# Patient Record
Sex: Male | Born: 1945 | Race: Black or African American | Hispanic: No | Marital: Single | Smoking: Never smoker
Health system: Southern US, Community
[De-identification: ages and names within clinical notes are randomized; demographics above are authoritative.]

## PROBLEM LIST (undated history)

## (undated) DIAGNOSIS — N400 Enlarged prostate without lower urinary tract symptoms: Secondary | ICD-10-CM

## (undated) DIAGNOSIS — M6281 Muscle weakness (generalized): Secondary | ICD-10-CM

## (undated) DIAGNOSIS — E039 Hypothyroidism, unspecified: Secondary | ICD-10-CM

## (undated) DIAGNOSIS — E875 Hyperkalemia: Secondary | ICD-10-CM

## (undated) DIAGNOSIS — A419 Sepsis, unspecified organism: Secondary | ICD-10-CM

## (undated) DIAGNOSIS — F79 Unspecified intellectual disabilities: Secondary | ICD-10-CM

## (undated) DIAGNOSIS — I10 Essential (primary) hypertension: Secondary | ICD-10-CM

## (undated) DIAGNOSIS — N12 Tubulo-interstitial nephritis, not specified as acute or chronic: Secondary | ICD-10-CM

## (undated) DIAGNOSIS — E870 Hyperosmolality and hypernatremia: Secondary | ICD-10-CM

## (undated) DIAGNOSIS — K219 Gastro-esophageal reflux disease without esophagitis: Secondary | ICD-10-CM

## (undated) DIAGNOSIS — N1330 Unspecified hydronephrosis: Secondary | ICD-10-CM

## (undated) DIAGNOSIS — N289 Disorder of kidney and ureter, unspecified: Secondary | ICD-10-CM

## (undated) DIAGNOSIS — R479 Unspecified speech disturbances: Secondary | ICD-10-CM

## (undated) DIAGNOSIS — N179 Acute kidney failure, unspecified: Secondary | ICD-10-CM

## (undated) DIAGNOSIS — N151 Renal and perinephric abscess: Secondary | ICD-10-CM

## (undated) DIAGNOSIS — E785 Hyperlipidemia, unspecified: Secondary | ICD-10-CM

## (undated) DIAGNOSIS — F17200 Nicotine dependence, unspecified, uncomplicated: Secondary | ICD-10-CM

## (undated) DIAGNOSIS — N19 Unspecified kidney failure: Secondary | ICD-10-CM

## (undated) DIAGNOSIS — R339 Retention of urine, unspecified: Secondary | ICD-10-CM

## (undated) DIAGNOSIS — Z992 Dependence on renal dialysis: Secondary | ICD-10-CM

## (undated) DIAGNOSIS — F419 Anxiety disorder, unspecified: Secondary | ICD-10-CM

## (undated) DIAGNOSIS — R609 Edema, unspecified: Secondary | ICD-10-CM

## (undated) DIAGNOSIS — D649 Anemia, unspecified: Secondary | ICD-10-CM

## (undated) DIAGNOSIS — I82409 Acute embolism and thrombosis of unspecified deep veins of unspecified lower extremity: Secondary | ICD-10-CM

## (undated) DIAGNOSIS — N139 Obstructive and reflux uropathy, unspecified: Secondary | ICD-10-CM

## (undated) DIAGNOSIS — E46 Unspecified protein-calorie malnutrition: Secondary | ICD-10-CM

## (undated) DIAGNOSIS — R93429 Abnormal radiologic findings on diagnostic imaging of unspecified kidney: Secondary | ICD-10-CM

## (undated) DIAGNOSIS — N390 Urinary tract infection, site not specified: Secondary | ICD-10-CM

## (undated) DIAGNOSIS — N189 Chronic kidney disease, unspecified: Secondary | ICD-10-CM

## (undated) DIAGNOSIS — T827XXA Infection and inflammatory reaction due to other cardiac and vascular devices, implants and grafts, initial encounter: Secondary | ICD-10-CM

## (undated) DIAGNOSIS — N3289 Other specified disorders of bladder: Secondary | ICD-10-CM

## (undated) DIAGNOSIS — Z789 Other specified health status: Secondary | ICD-10-CM

## (undated) DIAGNOSIS — R195 Other fecal abnormalities: Secondary | ICD-10-CM

## (undated) DIAGNOSIS — IMO0001 Reserved for inherently not codable concepts without codable children: Secondary | ICD-10-CM

## (undated) SURGERY — CYSTOSCOPY, WITH RETROGRADE PYELOGRAM
Anesthesia: Choice | Laterality: Bilateral

---
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
## 2001-03-04 ENCOUNTER — Inpatient Hospital Stay (HOSPITAL_COMMUNITY): Admission: AD | Admit: 2001-03-04 | Discharge: 2001-03-09 | Payer: Self-pay | Admitting: Internal Medicine

## 2002-04-28 ENCOUNTER — Inpatient Hospital Stay (HOSPITAL_COMMUNITY): Admission: AD | Admit: 2002-04-28 | Discharge: 2002-05-06 | Payer: Self-pay | Admitting: General Surgery

## 2002-05-07 ENCOUNTER — Encounter (HOSPITAL_COMMUNITY): Admission: RE | Admit: 2002-05-07 | Discharge: 2002-06-06 | Payer: Self-pay | Admitting: Family Medicine

## 2003-10-07 ENCOUNTER — Emergency Department (HOSPITAL_COMMUNITY): Admission: EM | Admit: 2003-10-07 | Discharge: 2003-10-07 | Payer: Self-pay | Admitting: Emergency Medicine

## 2005-02-27 ENCOUNTER — Inpatient Hospital Stay (HOSPITAL_COMMUNITY): Admission: AD | Admit: 2005-02-27 | Discharge: 2005-03-04 | Payer: Self-pay | Admitting: Family Medicine

## 2006-01-25 ENCOUNTER — Emergency Department (HOSPITAL_COMMUNITY): Admission: EM | Admit: 2006-01-25 | Discharge: 2006-01-25 | Payer: Self-pay | Admitting: Emergency Medicine

## 2006-07-04 ENCOUNTER — Emergency Department (HOSPITAL_COMMUNITY): Admission: EM | Admit: 2006-07-04 | Discharge: 2006-07-04 | Payer: Self-pay | Admitting: Emergency Medicine

## 2007-01-19 ENCOUNTER — Emergency Department (HOSPITAL_COMMUNITY): Admission: EM | Admit: 2007-01-19 | Discharge: 2007-01-19 | Payer: Self-pay | Admitting: Emergency Medicine

## 2008-02-08 ENCOUNTER — Emergency Department (HOSPITAL_COMMUNITY): Admission: EM | Admit: 2008-02-08 | Discharge: 2008-02-08 | Payer: Self-pay | Admitting: Emergency Medicine

## 2008-02-10 ENCOUNTER — Ambulatory Visit: Payer: Self-pay | Admitting: Orthopedic Surgery

## 2008-02-10 DIAGNOSIS — S82409A Unspecified fracture of shaft of unspecified fibula, initial encounter for closed fracture: Secondary | ICD-10-CM | POA: Insufficient documentation

## 2008-02-15 ENCOUNTER — Ambulatory Visit: Payer: Self-pay | Admitting: Orthopedic Surgery

## 2008-02-17 ENCOUNTER — Ambulatory Visit: Payer: Self-pay | Admitting: Orthopedic Surgery

## 2008-02-17 ENCOUNTER — Telehealth: Payer: Self-pay | Admitting: Orthopedic Surgery

## 2008-02-21 ENCOUNTER — Encounter: Payer: Self-pay | Admitting: Orthopedic Surgery

## 2008-03-06 ENCOUNTER — Encounter: Payer: Self-pay | Admitting: Orthopedic Surgery

## 2008-03-21 ENCOUNTER — Encounter: Payer: Self-pay | Admitting: Orthopedic Surgery

## 2010-11-26 NOTE — Assessment & Plan Note (Signed)
Summary: [facility] F/UP/FX LT ANKLE/XR [facility] 02/08/08/SELFPAY/CAF   Vital Signs:  Patient Profile:   65 Years Old Male Pulse rate:   68 / minute Resp:     18 per minute  Vitals Entered By: Arther Abbott MD (February 10, 2008 3:17 PM)                 Chief Complaint:  left ankle fx.  History of Present Illness: I saw Zachary [name] in the office today for an initial visit.  He is a 65 years old man with the complaint of:  Left ankle fracture, xrays taken at [facility] on 02-08-08.  Injury happened around the end of March, he fell in his house. Today has splint on and is weightbearing with crutches.  He has some soreness not real bad pain.      Current Allergies: No known allergies   Past Medical History:    diabetes  Past Surgical History:    none   Social History:    Patient is single.     unemployed   Risk Factors:  Tobacco use:  never Caffeine use:  0 drinks per day Alcohol use:  no   Review of Systems  General      Denies weight loss, weight gain, fever, chills, and fatigue.  Cardiac      Denies chest pain, angina, heart attack, heart failure, poor circulation, blood clots, and phlebitis.  Resp      Denies short of breath, difficulty breathing, COPD, cough, and pneumonia.  GI      Denies nausea, vomiting, diarrhea, constipation, difficulty swallowing, ulcers, GERD, and reflux.  GU      Denies kidney failure, kidney transplant, kidney stones, burning, poor stream, testicular cancer, blood in urine, and .  Neuro      Denies headache, dizziness, migraines, numbness, weakness, tremor, and unsteady walking.  MS      Denies joint pain, rheumatoid arthritis, joint swelling, gout, bone cancer, osteoporosis, and .  Endo      Complains of diabetes.      Denies thyroid disease and goiter.  Psych      Denies depression, mood swings, anxiety, panic attack, bipolar, and schizophrenia.  Derm      Denies eczema, cancer, and itching.  EENT      Denies poor  vision, cataracts, glaucoma, poor hearing, vertigo, ears ringing, sinusitis, hoarseness, toothaches, and bleeding gums.  Immunology      Denies seasonal allergies, sinus problems, and allergic to bee stings.  Lymphatic      Denies lymph node cancer and lymph edema.     Impression & Recommendations:  Problem # 1:  FX CLOSED FIBULA NOS (ICD-823.81) Assessment: New the x-rays are from any pain hospital. They show a nondisplaced fibular fracture. They are dated April 14. Orders: New Patient Level III [id number]) Lat. Malleolus Fx [id number])  a short-leg cast was applied in neutral position.   Medications Added to Medication List This Visit: 1)  Glucophage 500 Mg Tabs (Metformin hcl) 2)  Vicodin 5-500 Mg Tabs (Hydrocodone-acetaminophen) .Marland Kitchen.. 1 q 4 prn   Patient Instructions: 1)  Please schedule a follow-up appointment in 5 weeks. 2)  xrays OOP 3)  WB in cast shoe     Prescriptions: VICODIN 5-500 MG  TABS (HYDROCODONE-ACETAMINOPHEN) 1 q 4 prn  #60 x 0   Entered by:   Peter Minium   Authorized by:   Arther Abbott MD   Signed by:   Carlena Bjornstad  [place] on 02/10/2008   Method used:   Print then Mail to Patient   RxID:   [id number]  ]

## 2010-11-26 NOTE — Progress Notes (Signed)
Summary: cast too tight  Phone Note Other Incoming   Summary of Call: Demaury Barahona' ([birth year]) sister, Judson Roch, called to say that Alija said his cast had gotted too tight since last night.  Was changed 02/15/08.  His # is [phone]. Initial call taken by: Ruffin Pyo,  February 17, 2008 11:50 AM  Follow-up for Phone Call        come in this pm  Follow-up by: Arther Abbott MD,  February 17, 2008 12:39 PM  Additional Follow-up for Phone Call Additional follow up Details #1::        patient advised to come in this afternoon Additional Follow-up by: Ruffin Pyo,  February 17, 2008 12:46 PM

## 2010-11-26 NOTE — Letter (Signed)
Summary: *Orthopedic No Show Letter  [facility]  [address]. [place] [address]  [place], [place] [address]   Phone: [phone]  Fax: [phone]     03/21/2008    [name] [address] [place], [place]  [address]    Dear [name],   [name] records indicate that you missed your scheduled appointment with [name] on Thursday, Mar 16, 2008.  Please contact this office to reschedule your appointment as soon as possible.  It is important that you keep your scheduled appointments with your physician, so we can provide you the best care possible.  We have enclosed an appointment card for your convenience.      Sincerely,   [name], MD [facility] Phone [phone]

## 2010-11-26 NOTE — Assessment & Plan Note (Signed)
Summary: CAST CHANGE/DOI 02/08/08/SELF PAY/CAF    History of Present Illness: I saw Zachary [name] in the office today for a followup visit.  He is a 65 years old man with the complaint of:  cast tight  He has a fibular fracture left leg nondisplaced cast placed approximately 4 days ago.    Current Allergies: No known allergies         Impression & Recommendations:  Problem # 1:  FX CLOSED FIBULA NOS (ICD-823.81)  Orders: Post-Op Check [id number]) Short Leg Cast [id number])    Patient Instructions: 1)  keep appt    ]

## 2010-11-26 NOTE — Assessment & Plan Note (Signed)
Summary: CAST CHANGE/BSF    History of Present Illness: I saw Zachary [name] in the office today for a followup visit.  He is a 65 years old man with the complaint of:  Cast too tight.  DOI end of march.    Current Allergies: No known allergies       Physical Exam  Extremities:     no change in foot and ankle    Impression & Recommendations:  Problem # 1:  FX CLOSED FIBULA NOS (ICD-823.81) Assessment: Unchanged AIR CAST  Orders: Post-Op Check [id number])    Patient Instructions: 1)  Keep appointment 2)  Keep air cast on except for bathing.    ]

## 2010-12-01 ENCOUNTER — Emergency Department (HOSPITAL_COMMUNITY)
Admission: EM | Admit: 2010-12-01 | Discharge: 2010-12-01 | Disposition: A | Discharge status: Home / Self Care | Payer: Self-pay | Attending: Emergency Medicine | Admitting: Emergency Medicine

## 2010-12-01 DIAGNOSIS — N419 Inflammatory disease of prostate, unspecified: Secondary | ICD-10-CM | POA: Insufficient documentation

## 2010-12-01 DIAGNOSIS — R358 Other polyuria: Secondary | ICD-10-CM | POA: Insufficient documentation

## 2010-12-01 DIAGNOSIS — R631 Polydipsia: Secondary | ICD-10-CM | POA: Insufficient documentation

## 2010-12-01 DIAGNOSIS — R3589 Other polyuria: Secondary | ICD-10-CM | POA: Insufficient documentation

## 2010-12-01 DIAGNOSIS — I10 Essential (primary) hypertension: Secondary | ICD-10-CM | POA: Insufficient documentation

## 2010-12-01 DIAGNOSIS — R350 Frequency of micturition: Secondary | ICD-10-CM | POA: Insufficient documentation

## 2010-12-01 DIAGNOSIS — E119 Type 2 diabetes mellitus without complications: Secondary | ICD-10-CM | POA: Insufficient documentation

## 2010-12-01 DIAGNOSIS — Z794 Long term (current) use of insulin: Secondary | ICD-10-CM | POA: Insufficient documentation

## 2010-12-01 LAB — URINE MICROSCOPIC-ADD ON

## 2010-12-01 LAB — CBC
HCT: 34.4 % — ABNORMAL LOW (ref 39.0–52.0)
Hemoglobin: 12.4 g/dL — ABNORMAL LOW (ref 13.0–17.0)
MCH: 33.4 pg (ref 26.0–34.0)
MCHC: 36 g/dL (ref 30.0–36.0)
MCV: 92.7 fL (ref 78.0–100.0)
Platelets: 185 10*3/uL (ref 150–400)
RBC: 3.71 MIL/uL — ABNORMAL LOW (ref 4.22–5.81)
RDW: 12.8 % (ref 11.5–15.5)
WBC: 5.2 10*3/uL (ref 4.0–10.5)

## 2010-12-01 LAB — URINALYSIS, ROUTINE W REFLEX MICROSCOPIC
Bilirubin Urine: NEGATIVE
Ketones, ur: NEGATIVE mg/dL
Nitrite: NEGATIVE
Protein, ur: NEGATIVE mg/dL
Specific Gravity, Urine: 1.01 (ref 1.005–1.030)
Urine Glucose, Fasting: >1000 mg/dL — AB
Urobilinogen, UA: 0.2 mg/dL (ref 0.0–1.0)
pH: 5 (ref 5.0–8.0)

## 2010-12-01 LAB — GLUCOSE, CAPILLARY
Glucose-Capillary: 387 mg/dL — ABNORMAL HIGH (ref 70–99)
Glucose-Capillary: 131 mg/dL — ABNORMAL HIGH (ref 70–99)

## 2010-12-01 LAB — DIFFERENTIAL
Basophils Absolute: 0 10*3/uL (ref 0.0–0.1)
Basophils Relative: 1 % (ref 0–1)
Eosinophils Absolute: 0.3 10*3/uL (ref 0.0–0.7)
Eosinophils Relative: 5 % (ref 0–5)
Lymphocytes Relative: 38 % (ref 12–46)
Lymphs Abs: 2 10*3/uL (ref 0.7–4.0)
Monocytes Absolute: 0.6 10*3/uL (ref 0.1–1.0)
Monocytes Relative: 12 % (ref 3–12)
Neutro Abs: 2.3 10*3/uL (ref 1.7–7.7)
Neutrophils Relative %: 45 % (ref 43–77)

## 2010-12-01 LAB — BASIC METABOLIC PANEL
BUN: 9 mg/dL (ref 6–23)
CO2: 23 mEq/L (ref 19–32)
Calcium: 8.8 mg/dL (ref 8.4–10.5)
Chloride: 103 mEq/L (ref 96–112)
Creatinine, Ser: 0.99 mg/dL (ref 0.4–1.5)
GFR calc Af Amer: >60 mL/min (ref >60)
GFR calc non Af Amer: >60 mL/min (ref >60)
Glucose, Bld: 423 mg/dL — ABNORMAL HIGH (ref 70–99)
Potassium: 3.7 mEq/L (ref 3.5–5.1)
Sodium: 136 mEq/L (ref 135–145)

## 2010-12-02 LAB — URINE CULTURE
Colony Count: 50000
Culture  Setup Time: 201202052018

## 2011-03-14 NOTE — Discharge Summary (Signed)
NAMESKILAR, LEFRANCOIS NO.:  [id number]   MEDICAL RECORD NO.:  [id number]          PATIENT TYPE:  INP   LOCATION:  A320                          FACILITY:  [facility]   PHYSICIAN:  Barrie Folk. Hill, MD     DATE OF BIRTH:  Mar 18, 1946   DATE OF ADMISSION:  02/27/2005  DATE OF DISCHARGE:  05/09/2006LH                                 DISCHARGE SUMMARY   HISTORY OF PRESENT ILLNESS:  The patient was a 65 year old singled, employed  black male from [place], [place]:  Increased thirst and increased frequency of urination.  Duration of symptoms was two weeks.  The patient was also complaining of dry  mouth and general malaise.  He had lost approximately 12 pounds since Mar 15, 2004.   PAST MEDICAL HISTORY:  1.  Insulin-dependent diabetes mellitus.  [name] last office visit      was Mar 15, 2004, for current visit.  2.  Hospitalization for pneumonia by Dr. Moshe Cipro.  3.  Nonketotic hyperosmolar hyperglycemia in March 2001.  4.  Uncontrolled diabetes mellitus in May 2002.  5.  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   ALLERGIES:  No known drug allergies.   HABITS:  Negative for tobacco, ethanol, or street drugs.   FAMILY HISTORY:  Mother deceased, cause unknown.  Father deceased, cause  unknown.  One brother deceased secondary to complications of renal failure.  One sister deceased, cause unknown.  Two sisters living.  One with a history  of diabetes mellitus, and one in her 85s health unknown.  Three brothers  living.  One with a history of diabetes mellitus and two with a history of  hypertension.  Two daughters, currently adolescent, living, good health.   HOSPITAL COURSE:  #1 -  DIABETIC KETOACIDOSIS:  GENERAL:  The patient  appeared to be a middle-aged medium height, middle framed, adult black male  in no current respiratory distress.  VITAL SIGNS:  Temperature 98.7, pulse  106, respiratory rate 20,  blood pressure 132/82.  LUNGS:  Clear.  HEART:  S1  and S2 without murmurs, rubs, or gallops.  Rhythm was regular and rate was  within normal limits.  ABDOMEN:  Slightly obese with hypoactive bowel  sounds.  Abdomen was soft and nontender in all four quadrants.  No palpable  mass or organomegaly.  EXTREMITIES:  No ulcers, redness, discoloration, or  edema.  Feet demonstrated palpable dorsalis pedis bilaterally.  NEUROLOGIC:  The patient was neurologically intact.  Significant labs on admission were  as follows:  White count 4.5, hemoglobin 13.7, hematocrit 38.8, platelets  189,000.  Sodium 130, potassium 3.6, chloride 95, CO2 of 26, glucose 289,  BUN 12, creatinine 1, calcium 8.6.  Total protein 7.4, albumin 3.3, AST 24,  ALT 30, ALP 94, total bilirubin 1.3.  Serum ketones large.  Hemoglobin A1C  of 11.5.  Urinalysis (specific gravity 1.02, pH of 5, glucose of 500 mg/dL,  trace of hemoglobin, negative for bilirubin, 40 mg/dL of ketones, nitrite  negative,  leukocyte esterase negative).  The patient was treated with IV  fluids using normal saline, Accu-Chek a.c. and bedtime, dietitian consult,  Metaglip 5/500 p.o. daily initially, Lantus insulin subcutaneously every  2200, and diet carbohydrate modified, 4 g sodium, low cholesterol, 2200  calories.  [name] hospitalization was uneventful.  Dietitian did see  the patient during this hospitalization and recommended carbohydrate  modified diet.  The patient received diabetic teaching pertaining to the use  of Lantus insulin pen during this hospitalization.  Fasting blood sugar on  the morning of discharge was 151.  The patient had no complaint of stomach  pain, nausea, vomiting, or diaphoresis.  He was alert and oriented to  person, place, and time.  Skin was warm and dry.  Abdominal examination was  benign.  The patient was discharged to his home where he lives with his  sister.   #2 -  ELECTROLYTE IMBALANCE:  Serum sodium on admission  was 130, with a  potassium of 3.6, chloride of 95, CO2 of 26.  The patient had repeat  electrolytes on Feb 28, 2005, that revealed the following:  Sodium of 134,  potassium of 3.8, chloride of 102, CO2 of 28.  He was treated with IV normal  saline on admission.   #3 -  MILD PROSTATIC ENLARGEMENT:  Rectal examination demonstrated no  external lesions.  Prostate was mildly enlarged and smooth.  Stool was  slightly positive.  The patient will be scheduled for an outpatient  colonoscopy.  CEA level was 2.2.  PSA was 1.99 ng/mL (normal 0.1 to 4).  Free TSA was 0.3 ng/mL.   #4 -  DIABETIC PERIPHERAL NEUROPATHY:  The patient complained of numbness of  hands and feet at times.  He was started on Neurontin 300 mg p.o. every day.  The patient is not complaining of this type of discomfort at time of  discharge.   LABORATORY DATA:  Fasting lipid profile demonstrated the following on Feb 28, 2005, at 0605:  Total cholesterol 143 mg/dL, triglycerides 126 mg/dL, HDL 24  mg/dL, LDL of 94 mg/dL.   DISCHARGE INSTRUCTIONS:  1.  Diet:  Carbohydrate modified, low salt.  2.  Activity:  No restrictions.   DISCHARGE MEDICATIONS:  1.  Glyburide/Metformin 5/500 one tablet b.i.d. with meals (breakfast and      supper).  2.  Lantus insulin 28 units subcutaneously at 10 p.m. every day.  3.  Gabapentin 300 mg one tab daily.  4.  Aspirin 81 mg p.o. daily.  5.  Accu-Chek at 7 a.m. and 10 p.m. every day.   FOLLOWUP:  Follow up at office on Mar 11, 2005.   FINAL PRIMARY DIAGNOSIS:  Diabetic ketoacidosis.   SECONDARY DIAGNOSES:  1.  Electrolyte imbalance.  2.  Mildly enlarged prostate.  3.  Diabetic peripheral neuropathy.      GKH/MEDQ  D:  03/04/2005  T:  03/04/2005  Job:  [id number]

## 2011-03-14 NOTE — H&P (Signed)
Zachary [name], Zachary [name] NO.:  [id number]   MEDICAL RECORD NO.:  [id number]          PATIENT TYPE:  INP   LOCATION:  A320                          FACILITY:  [facility]   PHYSICIAN:  Barrie Folk. Berdine Addison, MD     DATE OF BIRTH:  1946-09-13   DATE OF ADMISSION:  02/27/2005  DATE OF DISCHARGE:  LH                                HISTORY & PHYSICAL   IDENTIFYING DATA:  The patient is a 65 year old single, employed black male  from [place], [place].   CHIEF COMPLAINT:  Increased thirst and increased frequency of urination.   HISTORY OF PRESENT ILLNESS:  Duration of symptoms is two weeks.  Patient  also complaining of a dry mouth and general malaise.  Patient has  experienced some mild weight loss of 12 more pounds since Mar 15, 2004.  He  denies nausea, vomiting, syncope, leg ulcers, stomach pain, and blurred  vision.   PAST MEDICAL HISTORY:  Positive for insulin-dependent diabetes mellitus.   Medical history is negative for hypertension, diabetes, tuberculosis,  cancer, sickle cell, asthma, and seizure disorder.   PRESCRIBED MEDICATIONS ON ADMISSION:  1.  Humalog 75/25 5 units subcu every morning at breakfast.  2.  Metaglip 5/500 p.o. every day with breakfast.   [name] last office visit was Mar 15, 2004 before the visit today.   The patient is not allergic to any known medication.   HABITS:  Negative for tobacco, ethanol, or street drugs.   PAST MEDICAL HISTORY:  Positive for hospitalization for pneumonia, Dr.  Moshe Cipro.  Hospitalization for nonketotic hyperosmolar hyperglycemia in  March, 2001.  Hospitalization for uncontrolled type 2 diabetes mellitus in  May, 2002.  Hospitalization for severe felon of right index finger with  secondary tenosynovitis in July, 2003.   REVIEW OF SYSTEMS:  Negative for chronic headache, epistaxis, bleeding gums,  hemoptysis, chronic cough, wheezing, shortness of breath, hematemesis, gross  hematuria, dysuria, melena,  diarrhea, constipation, edema of the leg, night  sweats, dysphagia, etc.   FAMILY HISTORY:  Mother deceased, cause unknown.  Father deceased, cause  unknown.  One brother deceased, secondary to complication of renal failure.  One sister deceased, cause unknown.  Two sisters living, one in her 63s with  a history of diabetes mellitus and one in her 57s, health unknown.  Three  brothers, living.  One with a history of diabetes mellitus.  Two with a  history of hypertension.  The patient has two children (daughters),  adolescents.   Social history revealed low education level and mild mental retardation.   PHYSICAL EXAMINATION:  VITAL SIGNS:  Temperature 98.7, pulse 106,  respirations 20, blood pressure 132/82.  GENERAL APPEARANCE:  A middle-aged, medium height, medium frame, alert black  male in no apparent respiratory distress.  HEENT:  Head normocephalic.  Ears:  Normal auricles.  External canal patent.  Tympanic membranes are pearly gray.  Eyes:  Lids negative for ptosis.  Sclerae are white.  Pupils are equal, round and reactive to light.  Extraocular movements are intact.  No negative discharge.  Mouth:  Dentition  fair.  Positive for missing teeth.  No bleeding gums.  No oral lesions.  Posterior pharynx benign.  NECK:  Negative for lymphadenopathy or thyromegaly.  HEART:  Audible S1 and S2.  No murmur, rub or gallop.  Regular rhythm and  rate within normal limits.  ABDOMEN:  Slightly obese.  Hypoactive bowel sounds.  Soft, nontender in all  four quadrants.  No palpable mass.  No organomegaly.  GENITALIA:  Penis uncircumcised.  No penile lesion or discharge.  Scrotum,  pelvic, testicles without nodules or tenderness.  RECTAL:  No external lesions.  Prostate mildly enlarged and smooth.  Stool  guaiac positive.  EXTREMITIES:  Knees:  Positive with crepitus bilaterally.  No joint  swelling.  No joint redness.  No joint warmth.  No atypia edema.  Feet:  Palpable dorsalis pedis  bilaterally.  NEUROLOGIC:  Alert and oriented to person, place, and time.  Cranial nerves  II-XII appeared intact.   LABS:  White count 4.5, hemoglobin 13.7, hematocrit 38.8, platelets 189,000.  Sodium 130, potassium 3.6, chloride 95, CO2 26, glucose 289, BUN 12,  creatinine 1.  Total bilirubin 1.3.  Alkaline phosphatase 94.  SGOT 24, SGPT  30, total protein 7.4, albumin 3.3, calcium 8.6.  Urinalysis:  Specific  gravity 1.020, pH 5, urine glucose 500 mg/dl, bilirubin negative, ketones 40  mg/dl, trace blood, trace protein, nitrate negative, leukocyte negative.   PLAN:  IV fluids.  Accu-Chek q.a.c. and at bedtime.  Dietician consult.  Diabetic teaching.  Metaglip 5/500 p.o. daily with breakfast.  Lantus  insulin 20 units subcu at 2200.  CEA level.  EKG.   DIET:  Carbohydrate-modified, 4 gm sodium, low cholesterol.   Fasting lipid profile, hemoglobin A1C, EKG, diagnostic colonoscopy if  possible because of guaiac positive stool.   FINAL PRIMARY DIAGNOSES:  1.  Uncontrolled diabetes mellitus.  2.  Hypertension.  3.  Enlarged prostate.      GKH/MEDQ  D:  02/27/2005  T:  02/27/2005  Job:  [id number]

## 2011-03-14 NOTE — H&P (Signed)
[facility]  Patient:    Zachary [name], Zachary [name] Visit Number: [id number] MRN: [id number]          Service Type: MED Location: 3A A330 01 Attending Physician:  Delorise Jackson Dictated by:   Irving Shows, M.D. Admit Date:  04/28/2002                           History and Physical  HISTORY OF PRESENT ILLNESS:  A 65 year old male with history of diabetes mellitus, hyperlipidemia.  The patient states that he has had a sore finger for at least three weeks and possibly four weeks.  He has not received any therapy.  He has a major felon with extension of longus tendon with masses, swelling, and noticeable dissection of subcutaneous purulence along the entire finger from the tip to the metatarsophalangeal joint.  This involves the index finger of the left hand.  There is no extension to the other fingers, to the palmar, or dorsal aspect of his hand.  He cannot bend the index finger at all.  Digital block was done and drainage of a large volume of pus was carried out in the office.  It appeared that most of the muscular and tendinous aspect of the finger was involved.  The patient was admitted for severe tenosynovitis.  PAST MEDICAL HISTORY:  He has diabetes and hyperlipidemia as noted above.  MEDICATIONS: 1. Tri-Chlor 160 mg q.d. 2. Glucophage XR 500 mg q.d. 3. Actos 30 mg q.d.  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  He does not drink, smoke, or use drugs.  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  PHYSICAL EXAMINATION:  VITAL SIGNS:  Blood pressure 153/98, pulse 90, respirations 18.  Weight 183 pounds.  Height 5 foot 11 inches.  HEENT:  Unremarkable.  There is no thrush.  NECK:  Supple without JVD or bruit.  CHEST:  Clear to auscultation.  HEART:  Regular rate and rhythm without murmur, gallop, or rub.  ABDOMEN:  Soft and nontender.  No masses.  Normal active bowel  sounds.  EXTREMITIES:  Unremarkable except for the extensor felon with tenosynovitis of the right index finger as described above.  There is no lymphangitis or axillary adenopathy.  NEUROLOGICAL:  No focal motor sensory or other cerebellar deficit.  IMPRESSION: 1. Extensor felon with secondary tenosynovitis right index finger. 2. Uncontrolled diabetes mellitus. 3. Hyperlipidemia. 4. Mental retardation.  PLAN:  The patient will be admitted.  He will be treated with IV Zosyn and Cleocin.  He will receive daily lavage and dressing changes.  If there is progression of the infection, we will get orthopedic involvement for I&D of his hand.  It is probable that this treatment as noted above will control his infection. Dictated by:   Irving Shows, M.D. Attending Physician:  Delorise Jackson DD:  04/28/02 TD:  04/28/02 Job: [id number]

## 2011-03-14 NOTE — Discharge Summary (Signed)
NAMEEVERETT, MYRACLE NO.:  [id number]   MEDICAL RECORD NO.:  [id number]                  PATIENT TYPE:   LOCATION:                                       FACILITY:   PHYSICIAN:  Vernon Prey. Tamala Julian, M.D.                DATE OF BIRTH:   DATE OF ADMISSION:  04/28/2002  DATE OF DISCHARGE:  05/06/2002                                 DISCHARGE SUMMARY   DISCHARGE DIAGNOSES:  1. 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  2. Uncontrolled diabetes mellitus.  3. Hyperlipidemia.  4. Mental retardation.   DISPOSITION:  The patient is discharged home in stable and improved  condition.  He will have home health follow-up with continued wound care.   MEDICATIONS:  1. Keflex 500 mg b.i.d. x7 days.  2. Cleocin 300 mg q.i.d. x7 days.  3. Glucophage XR 500 mg b.i.d.  4. Actos 30 mg q.d.  5. TriCor 160 mg q.d.  6. Lortab 5 mg q.i.d.  7. NPH Insulin 10 units before breakfast and 10 units each evening.   FOLLOW-UP:  He will be seen in the office in one week.   SUMMARY:  A 65 year old male with a history of diabetes mellitus,  hyperlipidemia, who presented to the office with a sore finger for more than  four weeks.  He had an infected felon with extension through to the extensor  tendons with massive swelling and noticeable dissection of subcutaneous  purulence along the entire finger from the tip to the metatarsophalangeal  joint.  This involved the index finger of the left hand.  There was no other  extension to the other fingers of the hand.  He could not bend the index  finger at all.  Digital block was done and drainage of a large volume of pus  was carried out in the office.  It appeared that most of the muscular and  tendonous aspect of the finger was involved.  He was admitted with severe  tenosynovitis.   HISTORY:  Other medical problems include diabetes mellitus and  hyperlipidemia.  He had no other physical problems.   HOSPITAL  COURSE:  The patient was admitted for local wound care and  antibiotics.  He received Zosyn and Cleocin.  Daily wound care was carried  out.  He responded appropriately.  The infection improved.  The cellulitis  resolved.  Near the end of his hospital stay he had return of some movement  on flexion of the finger.  He was afebrile over the last five days of his  hospitalization.  It was felt that he was stable enough for discharge on  antibiotics and he was discharged to have outpatient care by physical  therapy on a daily basis.  Vernon Prey. Tamala Julian, M.D.    LCS/MEDQ  D:  07/24/2002  T:  07/25/2002  Job:  [id number]

## 2011-03-25 ENCOUNTER — Emergency Department (HOSPITAL_COMMUNITY): Payer: Self-pay

## 2011-03-25 ENCOUNTER — Emergency Department (HOSPITAL_COMMUNITY)
Admission: EM | Admit: 2011-03-25 | Discharge: 2011-03-25 | Disposition: A | Discharge status: Home / Self Care | Payer: Self-pay | Attending: Emergency Medicine | Admitting: Emergency Medicine

## 2011-03-25 DIAGNOSIS — L03119 Cellulitis of unspecified part of limb: Secondary | ICD-10-CM | POA: Insufficient documentation

## 2011-03-25 DIAGNOSIS — L02419 Cutaneous abscess of limb, unspecified: Secondary | ICD-10-CM | POA: Insufficient documentation

## 2011-03-25 DIAGNOSIS — E119 Type 2 diabetes mellitus without complications: Secondary | ICD-10-CM | POA: Insufficient documentation

## 2011-03-25 DIAGNOSIS — M7989 Other specified soft tissue disorders: Secondary | ICD-10-CM | POA: Insufficient documentation

## 2011-03-25 LAB — GLUCOSE, CAPILLARY: Glucose-Capillary: 115 mg/dL — ABNORMAL HIGH (ref 70–99)

## 2011-03-29 LAB — WOUND CULTURE: Gram Stain: NONE SEEN

## 2011-07-22 LAB — URINALYSIS, ROUTINE W REFLEX MICROSCOPIC
Bilirubin Urine: NEGATIVE
Glucose, UA: >1000 — AB
Nitrite: NEGATIVE
Protein, ur: 30 — AB
Specific Gravity, Urine: 1.015
Urobilinogen, UA: 0.2
pH: 5

## 2011-07-22 LAB — URINE MICROSCOPIC-ADD ON

## 2011-09-30 ENCOUNTER — Other Ambulatory Visit: Payer: Self-pay

## 2011-09-30 ENCOUNTER — Emergency Department (HOSPITAL_COMMUNITY)
Admission: EM | Admit: 2011-09-30 | Discharge: 2011-09-30 | Disposition: A | Discharge status: Home / Self Care | Payer: Medicare Other | Attending: Emergency Medicine | Admitting: Emergency Medicine

## 2011-09-30 DIAGNOSIS — E119 Type 2 diabetes mellitus without complications: Secondary | ICD-10-CM | POA: Insufficient documentation

## 2011-09-30 DIAGNOSIS — I10 Essential (primary) hypertension: Secondary | ICD-10-CM | POA: Insufficient documentation

## 2011-09-30 DIAGNOSIS — I498 Other specified cardiac arrhythmias: Secondary | ICD-10-CM | POA: Insufficient documentation

## 2011-09-30 DIAGNOSIS — R739 Hyperglycemia, unspecified: Secondary | ICD-10-CM

## 2011-09-30 DIAGNOSIS — R040 Epistaxis: Secondary | ICD-10-CM | POA: Insufficient documentation

## 2011-09-30 HISTORY — DX: Essential (primary) hypertension: I10

## 2011-09-30 LAB — GLUCOSE, CAPILLARY
Glucose-Capillary: 453 mg/dL — ABNORMAL HIGH (ref 70–99)
Glucose-Capillary: 365 mg/dL — ABNORMAL HIGH (ref 70–99)

## 2011-09-30 MED ORDER — INSULIN ASPART 100 UNIT/ML ~~LOC~~ SOLN
12.0000 [IU] | Freq: Once | SUBCUTANEOUS | Status: AC
  Administered 2011-09-30: 12 [IU] via INTRAVENOUS

## 2011-09-30 MED ORDER — SODIUM CHLORIDE 0.9 % IV BOLUS (SEPSIS)
1000.0000 mL | Freq: Once | INTRAVENOUS | Status: AC
  Administered 2011-09-30: 1000 mL via INTRAVENOUS

## 2011-09-30 NOTE — ED Notes (Signed)
Pt with MR, family member states that he may not understand questions asked, reported that pt with nosebleed last night to right side, telemetry placed due to high HR

## 2011-09-30 NOTE — ED Notes (Signed)
Family member states pt has a nose bleed last night. States she thinks pts sugar may be up

## 2011-09-30 NOTE — ED Provider Notes (Signed)
History   This chart was scribed for [employer]. Olin Hauser, MD found by Mitchell Heir. The patient was seen in room APA19/APA19 and the patient's care was started at 2:25 PM.   CSN: [id number] Arrival date & time: 09/30/2011 12:19 PM   First MD Initiated Contact with Patient 09/30/11 1404      Chief Complaint  Patient presents with  . Epistaxis    (Consider location/radiation/quality/duration/timing/severity/associated sxs/prior treatment) HPI Zachary [name] is a 65 y.o. male who presents to the Emergency Department complaining of an episode of moderate epistaxis from right nare, onset last night, which lasted until this afternoon, but is currently resolved. Patient reports also experiencing associated headache but denies experiencing any hearing and visions deficits.    Past Medical History  Diagnosis Date  . Hypertension   . Diabetes mellitus     History reviewed. No pertinent past surgical history.  History reviewed. No pertinent family history.  History  Substance Use Topics  . Smoking status: Not on file  . Smokeless tobacco: Not on file  . Alcohol Use: No      Review of Systems 10 Systems reviewed and are negative for acute change except as noted in the HPI.   Allergies  Review of patient's allergies indicates no known allergies.  Home Medications   Current Outpatient Rx  Name Route Sig Dispense Refill  . METFORMIN HCL 500 MG PO TABS Oral Take 500 mg by mouth 2 (two) times daily.        BP 135/85  Pulse 129  Temp(Src) 99.4 F (37.4 C) (Oral)  Resp 18  Ht 5\' 5"  (1.651 m)  Wt 187 lb 8 oz (85.049 kg)  BMI 31.20 kg/m2  SpO2 98%  Physical Exam  Nursing note and vitals reviewed. Constitutional: He is oriented to person, place, and time. He appears well-developed and well-nourished. No distress.  HENT:  Head: Normocephalic and atraumatic.  Right Ear: Tympanic membrane normal.  Left Ear: Tympanic membrane normal.       Right nare small scab present with no  active bleeding. Left nare normal no active bleeding.  Eyes: EOM are normal. Pupils are equal, round, and reactive to light.  Neck: Neck supple. No tracheal deviation present.  Cardiovascular: Normal rate, regular rhythm and normal heart sounds.  Exam reveals no gallop and no friction rub.   No murmur heard.      Tachycardic  Pulmonary/Chest: Effort normal and breath sounds normal. No respiratory distress. He has no wheezes. He has no rales.  Abdominal: Soft. Bowel sounds are normal. He exhibits no distension.  Musculoskeletal: Normal range of motion. He exhibits no edema.  Neurological: He is alert and oriented to person, place, and time. No sensory deficit.  Skin: Skin is warm and dry.  Psychiatric: He has a normal mood and affect. His behavior is normal.    ED Course  Procedures (including critical care time) DIAGNOSTIC STUDIES: Oxygen Saturation is 100% on room air, normal by my interpretation.    Date: 09/30/2011  1235  Rate: 133  Rhythm: sinus tachycardia  QRS Axis: normal  Intervals: normal  ST/T Wave abnormalities: normal  Conduction Disutrbances:none  Narrative Interpretation:   Old EKG Reviewed: none available Results for orders placed during the hospital encounter of 09/30/11  GLUCOSE, CAPILLARY      Component Value Range   Glucose-Capillary 453 (*) 70 - 99 (mg/dL)   Comment 1 Notify RN    GLUCOSE, CAPILLARY      Component Value Range  Glucose-Capillary 365 (*) 70 - 99 (mg/dL)   COORDINATION OF CARE: 1230 Patient has received first liter of IVF. Eating a lunch. No c/o. HR 130. 1340 Patient received 2nd liter of IVF. Up to bathroom. No complaints. Family member at the bedside. 1445 Patient has taken PO fluids, received 3rd liter of IVF., eaten a snack brought to him by family. No c/o 1520 Ambulated patient in the hallway. BP and HR normal 142/88, 98, O2 sats 99% on RA.      MDM  Patient with diabetes and hypertension here with nosebleed that occurred last  night and resolved spontaneously. Glucose readings high last night and were 453 on arrival. Given IVF, insulin with improvement. HR was elevated for majority of visit. No associated symptoms. With fluids, HR improved. Patient was in no distress. Pt stable in ED with no significant deterioration in condition.The patient appears reasonably screened and/or stabilized for discharge and I doubt any other medical condition or other [facility] requiring further screening, evaluation, or treatment in the ED at this time prior to discharge. I personally performed the services described in this documentation, which was scribed in my presence. The recorded information has been reviewed and considered.   MDM Reviewed: vitals and previous chart Interpretation: labs and ECG       [name]. Olin Hauser, MD 10/01/11 [phone]

## 2011-10-01 ENCOUNTER — Inpatient Hospital Stay (HOSPITAL_COMMUNITY)
Admission: EM | Admit: 2011-10-01 | Discharge: 2011-10-10 | DRG: 690 | Disposition: A | Discharge status: Home / Self Care | Payer: Medicare Other | Attending: Internal Medicine | Admitting: Internal Medicine

## 2011-10-01 ENCOUNTER — Emergency Department (HOSPITAL_COMMUNITY): Payer: Medicare Other

## 2011-10-01 ENCOUNTER — Encounter (HOSPITAL_COMMUNITY): Payer: Self-pay | Admitting: Emergency Medicine

## 2011-10-01 DIAGNOSIS — E119 Type 2 diabetes mellitus without complications: Secondary | ICD-10-CM | POA: Diagnosis present

## 2011-10-01 DIAGNOSIS — F79 Unspecified intellectual disabilities: Secondary | ICD-10-CM | POA: Diagnosis present

## 2011-10-01 DIAGNOSIS — N390 Urinary tract infection, site not specified: Secondary | ICD-10-CM | POA: Diagnosis not present

## 2011-10-01 DIAGNOSIS — R7881 Bacteremia: Secondary | ICD-10-CM | POA: Diagnosis present

## 2011-10-01 DIAGNOSIS — N151 Renal and perinephric abscess: Secondary | ICD-10-CM | POA: Diagnosis present

## 2011-10-01 DIAGNOSIS — D509 Iron deficiency anemia, unspecified: Secondary | ICD-10-CM | POA: Diagnosis present

## 2011-10-01 DIAGNOSIS — N10 Acute tubulo-interstitial nephritis: Secondary | ICD-10-CM | POA: Diagnosis present

## 2011-10-01 DIAGNOSIS — R509 Fever, unspecified: Secondary | ICD-10-CM | POA: Diagnosis present

## 2011-10-01 DIAGNOSIS — B951 Streptococcus, group B, as the cause of diseases classified elsewhere: Secondary | ICD-10-CM | POA: Diagnosis present

## 2011-10-01 DIAGNOSIS — J189 Pneumonia, unspecified organism: Secondary | ICD-10-CM | POA: Diagnosis present

## 2011-10-01 DIAGNOSIS — R059 Cough, unspecified: Secondary | ICD-10-CM | POA: Diagnosis present

## 2011-10-01 DIAGNOSIS — R93429 Abnormal radiologic findings on diagnostic imaging of unspecified kidney: Secondary | ICD-10-CM | POA: Diagnosis present

## 2011-10-01 DIAGNOSIS — F70 Mild intellectual disabilities: Secondary | ICD-10-CM | POA: Diagnosis present

## 2011-10-01 DIAGNOSIS — IMO0001 Reserved for inherently not codable concepts without codable children: Secondary | ICD-10-CM | POA: Diagnosis present

## 2011-10-01 DIAGNOSIS — R05 Cough: Secondary | ICD-10-CM | POA: Diagnosis present

## 2011-10-01 DIAGNOSIS — N3289 Other specified disorders of bladder: Secondary | ICD-10-CM | POA: Diagnosis not present

## 2011-10-01 HISTORY — DX: Nicotine dependence, unspecified, uncomplicated: F17.200

## 2011-10-01 HISTORY — DX: Reserved for inherently not codable concepts without codable children: IMO0001

## 2011-10-01 HISTORY — DX: Other specified disorders of bladder: N32.89

## 2011-10-01 HISTORY — DX: Abnormal radiologic findings on diagnostic imaging of unspecified kidney: R93.429

## 2011-10-01 HISTORY — DX: Urinary tract infection, site not specified: N39.0

## 2011-10-01 HISTORY — DX: Renal and perinephric abscess: N15.1

## 2011-10-01 LAB — DIFFERENTIAL
Basophils Absolute: 0 10*3/uL (ref 0.0–0.1)
Basophils Relative: 0 % (ref 0–1)
Eosinophils Absolute: 0 10*3/uL (ref 0.0–0.7)
Eosinophils Relative: 0 % (ref 0–5)
Lymphocytes Relative: 7 % — ABNORMAL LOW (ref 12–46)
Lymphs Abs: 1 10*3/uL (ref 0.7–4.0)
Monocytes Absolute: 1.8 10*3/uL — ABNORMAL HIGH (ref 0.1–1.0)
Monocytes Relative: 12 % (ref 3–12)
Neutro Abs: 12.3 10*3/uL — ABNORMAL HIGH (ref 1.7–7.7)
Neutrophils Relative %: 81 % — ABNORMAL HIGH (ref 43–77)

## 2011-10-01 LAB — COMPREHENSIVE METABOLIC PANEL
ALT: 14 U/L (ref 0–53)
AST: 14 U/L (ref 0–37)
Albumin: 2.8 g/dL — ABNORMAL LOW (ref 3.5–5.2)
Alkaline Phosphatase: 79 U/L (ref 39–117)
BUN: 14 mg/dL (ref 6–23)
CO2: 19 mEq/L (ref 19–32)
Calcium: 8.7 mg/dL (ref 8.4–10.5)
Chloride: 96 mEq/L (ref 96–112)
Creatinine, Ser: 1.2 mg/dL (ref 0.50–1.35)
GFR calc Af Amer: 73 mL/min — ABNORMAL LOW (ref >90)
GFR calc non Af Amer: 63 mL/min — ABNORMAL LOW (ref >90)
Glucose, Bld: 509 mg/dL — ABNORMAL HIGH (ref 70–99)
Potassium: 4 mEq/L (ref 3.5–5.1)
Sodium: 132 mEq/L — ABNORMAL LOW (ref 135–145)
Total Bilirubin: 0.5 mg/dL (ref 0.3–1.2)
Total Protein: 7.4 g/dL (ref 6.0–8.3)

## 2011-10-01 LAB — POCT I-STAT, CHEM 8
BUN: 14 mg/dL (ref 6–23)
Calcium, Ion: 1.1 mmol/L — ABNORMAL LOW (ref 1.12–1.32)
Chloride: 102 mEq/L (ref 96–112)
Creatinine, Ser: 1.3 mg/dL (ref 0.50–1.35)
Glucose, Bld: 482 mg/dL — ABNORMAL HIGH (ref 70–99)
HCT: 24 % — ABNORMAL LOW (ref 39.0–52.0)
Hemoglobin: 8.2 g/dL — ABNORMAL LOW (ref 13.0–17.0)
Potassium: 4.3 mEq/L (ref 3.5–5.1)
Sodium: 135 mEq/L (ref 135–145)
TCO2: 17 mmol/L (ref 0–100)

## 2011-10-01 LAB — INFLUENZA PANEL BY PCR (TYPE A & B)
H1N1 flu by pcr: NOT DETECTED
Influenza A By PCR: NEGATIVE
Influenza B By PCR: NEGATIVE

## 2011-10-01 LAB — LACTIC ACID, PLASMA: Lactic Acid, Venous: 2.3 mmol/L — ABNORMAL HIGH (ref 0.5–2.2)

## 2011-10-01 LAB — CBC
HCT: 27.5 % — ABNORMAL LOW (ref 39.0–52.0)
Hemoglobin: 8.4 g/dL — ABNORMAL LOW (ref 13.0–17.0)
MCH: 26.3 pg (ref 26.0–34.0)
MCHC: 30.5 g/dL (ref 30.0–36.0)
MCV: 85.9 fL (ref 78.0–100.0)
Platelets: 182 10*3/uL (ref 150–400)
RBC: 3.2 MIL/uL — ABNORMAL LOW (ref 4.22–5.81)
RDW: 14.8 % (ref 11.5–15.5)
WBC: 15.1 10*3/uL — ABNORMAL HIGH (ref 4.0–10.5)

## 2011-10-01 LAB — URINALYSIS, ROUTINE W REFLEX MICROSCOPIC
Bilirubin Urine: NEGATIVE
Glucose, UA: >1000 mg/dL — AB
Ketones, ur: 40 mg/dL — AB
Leukocytes, UA: NEGATIVE
Nitrite: NEGATIVE
Protein, ur: NEGATIVE mg/dL
Specific Gravity, Urine: 1.01 (ref 1.005–1.030)
Urobilinogen, UA: 0.2 mg/dL (ref 0.0–1.0)
pH: 5.5 (ref 5.0–8.0)

## 2011-10-01 LAB — URINE MICROSCOPIC-ADD ON

## 2011-10-01 LAB — GLUCOSE, CAPILLARY
Glucose-Capillary: 499 mg/dL — ABNORMAL HIGH (ref 70–99)
Glucose-Capillary: 360 mg/dL — ABNORMAL HIGH (ref 70–99)

## 2011-10-01 LAB — PROCALCITONIN: Procalcitonin: 1.28 ng/mL

## 2011-10-01 MED ORDER — DEXTROSE 5 % IV SOLN
500.0000 mg | INTRAVENOUS | Status: DC
  Administered 2011-10-02 – 2011-10-03 (×2): 500 mg via INTRAVENOUS
  Filled 2011-10-01 (×2): qty 500

## 2011-10-01 MED ORDER — ACETAMINOPHEN 325 MG PO TABS
ORAL_TABLET | ORAL | Status: AC
  Administered 2011-10-02: 650 mg
  Filled 2011-10-01: qty 1

## 2011-10-01 MED ORDER — DEXTROSE 5 % IV SOLN
500.0000 mg | Freq: Once | INTRAVENOUS | Status: AC
  Administered 2011-10-01: 500 mg via INTRAVENOUS
  Filled 2011-10-01: qty 500

## 2011-10-01 MED ORDER — SODIUM CHLORIDE 0.9 % IV BOLUS (SEPSIS)
1000.0000 mL | Freq: Once | INTRAVENOUS | Status: AC
  Administered 2011-10-01: 1000 mL via INTRAVENOUS

## 2011-10-01 MED ORDER — DEXTROSE 5 % IV SOLN
INTRAVENOUS | Status: AC
  Filled 2011-10-01: qty 500

## 2011-10-01 MED ORDER — ACETAMINOPHEN 325 MG PO TABS
650.0000 mg | ORAL_TABLET | Freq: Four times a day (QID) | ORAL | Status: DC | PRN
  Administered 2011-10-01: 650 mg via ORAL
  Filled 2011-10-01: qty 2
  Filled 2011-10-01: qty 1

## 2011-10-01 MED ORDER — DEXTROSE 5 % IV SOLN
1.0000 g | INTRAVENOUS | Status: DC
  Administered 2011-10-02 – 2011-10-05 (×5): 1 g via INTRAVENOUS
  Filled 2011-10-01 (×5): qty 10

## 2011-10-01 MED ORDER — DEXTROSE 5 % IV SOLN
INTRAVENOUS | Status: AC
  Administered 2011-10-01: 1 g via INTRAVENOUS
  Filled 2011-10-01: qty 10

## 2011-10-01 MED ORDER — SODIUM CHLORIDE 0.9 % IV SOLN
Freq: Once | INTRAVENOUS | Status: AC
  Administered 2011-10-01: 1000 mL via INTRAVENOUS

## 2011-10-01 MED ORDER — POTASSIUM CHLORIDE IN NACL 20-0.9 MEQ/L-% IV SOLN
INTRAVENOUS | Status: AC
  Administered 2011-10-02: via INTRAVENOUS

## 2011-10-01 MED ORDER — DEXTROSE 5 % IV SOLN
1.0000 g | Freq: Once | INTRAVENOUS | Status: AC
  Filled 2011-10-01: qty 10

## 2011-10-01 NOTE — ED Notes (Addendum)
Pt alert to baseline, able to answer some questions,  states that he is feeling better, family at bedside, updated on plan of care,. Pt requesting something to eat

## 2011-10-01 NOTE — ED Notes (Addendum)
Dr. Shanon Brow notified of vital signs, no additional order given

## 2011-10-01 NOTE — H&P (Signed)
Chief Complaint:  Fever cough for one day  HPI: 65 year old African American male with [name] history of hypertension and diabetes who comes in with the complaint of one day of fever as high as 103 along with cough for couple of days. Zachary [name] is [name] very poor historian and difficult to obtain [name] history from however what I can gather he denies any chest pain shortness of breath nausea vomiting diarrhea dysuria or abdominal pain. He denies being around any sick contacts lately or anybody with flu. He lives with his girlfriend.  Review of Systems:  Otherwise negative  Past Medical History: Past Medical History  Diagnosis Date  . Hypertension   . Diabetes mellitus    History reviewed. No pertinent past surgical history.  Medications: Prior to Admission medications   Medication Sig Start Date End Date Taking? Authorizing Provider  metFORMIN (GLUCOPHAGE) 500 MG tablet Take 500 mg by mouth 2 (two) times daily.      Historical Provider, MD    Allergies:  No Known Allergies  Social History:  does not have [name] smoking history on file. He has never used smokeless tobacco. He reports that he does not drink alcohol or use illicit drugs.  Family History: History reviewed. No pertinent family history.  Physical Exam: Filed Vitals:   10/01/11 1900 10/01/11 1930 10/01/11 1942 10/01/11 2000  BP: 130/78 131/80 128/82 112/91  Pulse: 117 112 83 111  Temp:   100.1 F (37.8 C)   TempSrc:   Oral   Resp: 15 15 20 19   Height:      Weight:      SpO2: 98% 100% 98% 100%   General appearance: alert, cooperative and no distress Resp: clear to auscultation bilaterally Cardio: regular rate and rhythm, S1, S2 normal, no murmur, click, rub or gallop GI: soft, non-tender; bowel sounds normal; no masses,  no organomegaly Extremities: extremities normal, atraumatic, no cyanosis or edema Pulses: 2+ and symmetric Skin: Skin color, texture, turgor normal. No rashes or lesions Neurologic: Grossly  normal   Labs on Admission:   [facility] 10/01/11 1807 10/01/11 1736  NA 135 132*  K 4.3 4.0  CL 102 96  CO2 -- 19  GLUCOSE 482* 509*  BUN 14 14  CREATININE 1.30 1.20  CALCIUM -- 8.7  MG -- --  PHOS -- --    Basename 10/01/11 1736  AST 14  ALT 14  ALKPHOS 79  BILITOT 0.5  PROT 7.4  ALBUMIN 2.8*    Basename 10/01/11 1807 10/01/11 1736  WBC -- 15.1*  NEUTROABS -- 12.3*  HGB 8.2* 8.4*  HCT 24.0* 27.5*  MCV -- 85.9  PLT -- 182    Radiological Exams on Admission: Dg Chest Portable 1 View  10/01/2011  *RADIOLOGY REPORT*  Clinical Data: Fever.  PORTABLE CHEST - 1 VIEW  Comparison: None.  Findings: 1746 hours.  The heart size and mediastinal contours are normal for portable technique.  There are patchy left greater than right basilar opacities.  No definite edema, pleural effusion or pneumothorax is identified.  Multiple telemetry leads overlie the chest.  IMPRESSION: Patchy bibasilar opacities may represent atelectasis.  However, in this context, early aspiration pneumonia cannot be excluded. Radiographic followup recommended.  Original Report Authenticated By: Vivia Ewing, M.D.    Assessment/Plan Present on Admission:   65 year old male with fever cough and pneumonia  .Fever blood cultures have been taken we'll also obtain sputum cultures  .Cough .Diabetes mellitus currently uncontrolled we'll place on sliding scale insulin and  hold his metformin for now  .Pneumonia place him on Rocephin and azithromycin for community acquired pneumonia we'll also check [name] quick flu. I reviewed his records and cannot find any previous history of CVA however the nursing staff reported past the patient denies previous CVA we'll obtain US for [name] speech therapy evaluation to make sure that he is not aspirating.  Zachary [name] 10/01/2011, 9:44 PM

## 2011-10-01 NOTE — ED Notes (Signed)
Hospitalist here to evaluate pt for admission,.

## 2011-10-01 NOTE — ED Notes (Signed)
CONTACT NUMBER FOR FAMILY Zachary [name] [phone]

## 2011-10-01 NOTE — ED Notes (Signed)
Report given to Latoya  On 300,

## 2011-10-01 NOTE — ED Notes (Signed)
Family notified of pt being moved to room 334,

## 2011-10-01 NOTE — ED Notes (Signed)
Patient brought in via EMS. Per EMS patient hyperglycemic. Patient lethargic and warm all day per family. Patient slightly confused. Per EMS patient's blood sugar read high on meter. Patient seen here yesterday for same reason.

## 2011-10-01 NOTE — ED Provider Notes (Signed)
History    Scribed for Zachary Diego, MD, the patient was seen in room APA15/APA15 . This chart was scribed by Glory Buff.   CSN: [id number] Arrival date & time: 10/01/2011  5:02 PM   First MD Initiated Contact with Patient 10/01/11 1712      Chief Complaint  Patient presents with  . Hyperglycemia   Level 5 caveat applies to this pt due to altered mental status. (Consider location/radiation/quality/duration/timing/severity/associated sxs/prior treatment) HPI Zachary [name] is a 65 y.o. male who presents to the Emergency Department complaining of hyperglycemia. Pt was brought to hospital via EMS and states he does not know who his PCP is. Pt reports a cough and denies vomit and hemoptysis.    Past Medical History  Diagnosis Date  . Hypertension   . Diabetes mellitus     History reviewed. No pertinent past surgical history.  History reviewed. No pertinent family history.  History  Substance Use Topics  . Smoking status: Not on file  . Smokeless tobacco: Never Used  . Alcohol Use: No      Review of Systems  Unable to perform ROS: Mental status change  Constitutional: Positive for fever.  Respiratory: Positive for cough.   Gastrointestinal: Negative for nausea and vomiting.    Allergies  Review of patient's allergies indicates no known allergies.  Home Medications   Current Outpatient Rx  Name Route Sig Dispense Refill  . METFORMIN HCL 500 MG PO TABS Oral Take 500 mg by mouth 2 (two) times daily.        BP 151/83  Pulse 129  Temp(Src) 103.1 F (39.5 C) (Oral)  Resp 18  Ht 5\' 8"  (1.727 m)  Wt 179 lb 14.4 oz (81.602 kg)  BMI 27.35 kg/m2  SpO2 100%  Physical Exam  Nursing note and vitals reviewed. Constitutional: He appears well-developed.  HENT:  Head: Normocephalic and atraumatic.       Pt has poor dentition  Eyes: Conjunctivae and EOM are normal. No scleral icterus.  Neck: Neck supple. No thyromegaly present.  Cardiovascular: Regular rhythm.   Tachycardia present.  Exam reveals no gallop and no friction rub.   No murmur heard. Pulmonary/Chest: No stridor. He has no wheezes. He has no rales. He exhibits no tenderness.  Abdominal: He exhibits no distension. There is no tenderness. There is no rebound.  Musculoskeletal: Normal range of motion. He exhibits no edema.  Lymphadenopathy:    He has no cervical adenopathy.  Neurological:       Oriented only to person  AMS  Skin: No rash noted. No erythema.  Psychiatric:       Altered mental status only oriented to person    ED Course  Procedures (including critical care time) 6:35pm Pt reassessed and alert and oriented x2 (person and place) per sister pt is at baseline with MR  8:20 PM: Pt informed of possible pneumonia finding and recommendation for admission. Pt agrees to be admitted.   Results for orders placed during the hospital encounter of 10/01/11  CBC      Component Value Range   WBC 15.1 (*) 4.0 - 10.5 (K/uL)   RBC 3.20 (*) 4.22 - 5.81 (MIL/uL)   Hemoglobin 8.4 (*) 13.0 - 17.0 (g/dL)   HCT 27.5 (*) 39.0 - 52.0 (%)   MCV 85.9  78.0 - 100.0 (fL)   MCH 26.3  26.0 - 34.0 (pg)   MCHC 30.5  30.0 - 36.0 (g/dL)   RDW 14.8  11.5 - 15.5 (%)  Platelets 182  150 - 400 (K/uL)  DIFFERENTIAL      Component Value Range   Neutrophils Relative 81 (*) 43 - 77 (%)   Neutro Abs 12.3 (*) 1.7 - 7.7 (K/uL)   Lymphocytes Relative 7 (*) 12 - 46 (%)   Lymphs Abs 1.0  0.7 - 4.0 (K/uL)   Monocytes Relative 12  3 - 12 (%)   Monocytes Absolute 1.8 (*) 0.1 - 1.0 (K/uL)   Eosinophils Relative 0  0 - 5 (%)   Eosinophils Absolute 0.0  0.0 - 0.7 (K/uL)   Basophils Relative 0  0 - 1 (%)   Basophils Absolute 0.0  0.0 - 0.1 (K/uL)  COMPREHENSIVE METABOLIC PANEL      Component Value Range   Sodium 132 (*) 135 - 145 (mEq/L)   Potassium 4.0  3.5 - 5.1 (mEq/L)   Chloride 96  96 - 112 (mEq/L)   CO2 19  19 - 32 (mEq/L)   Glucose, Bld 509 (*) 70 - 99 (mg/dL)   BUN 14  6 - 23 (mg/dL)   Creatinine,  Ser 1.20  0.50 - 1.35 (mg/dL)   Calcium 8.7  8.4 - 10.5 (mg/dL)   Total Protein 7.4  6.0 - 8.3 (g/dL)   Albumin 2.8 (*) 3.5 - 5.2 (g/dL)   AST 14  0 - 37 (U/L)   ALT 14  0 - 53 (U/L)   Alkaline Phosphatase 79  39 - 117 (U/L)   Total Bilirubin 0.5  0.3 - 1.2 (mg/dL)   GFR calc non Af Amer 63 (*) >90 (mL/min)   GFR calc Af Amer 73 (*) >90 (mL/min)  URINALYSIS, ROUTINE W REFLEX MICROSCOPIC      Component Value Range   Color, Urine STRAW (*) YELLOW    APPearance CLEAR  CLEAR    Specific Gravity, Urine 1.010  1.005 - 1.030    pH 5.5  5.0 - 8.0    Glucose, UA >1000 (*) NEGATIVE (mg/dL)   Hgb urine dipstick MODERATE (*) NEGATIVE    Bilirubin Urine NEGATIVE  NEGATIVE    Ketones, ur 40 (*) NEGATIVE (mg/dL)   Protein, ur NEGATIVE  NEGATIVE (mg/dL)   Urobilinogen, UA 0.2  0.0 - 1.0 (mg/dL)   Nitrite NEGATIVE  NEGATIVE    Leukocytes, UA NEGATIVE  NEGATIVE   LACTIC ACID, PLASMA      Component Value Range   Lactic Acid, Venous 2.3 (*) 0.5 - 2.2 (mmol/L)  PROCALCITONIN      Component Value Range   Procalcitonin 1.28    GLUCOSE, CAPILLARY      Component Value Range   Glucose-Capillary 499 (*) 70 - 99 (mg/dL)   Comment 1 Documented in Chart     Comment 2 Notify RN    POCT I-STAT, CHEM 8      Component Value Range   Sodium 135  135 - 145 (mEq/L)   Potassium 4.3  3.5 - 5.1 (mEq/L)   Chloride 102  96 - 112 (mEq/L)   BUN 14  6 - 23 (mg/dL)   Creatinine, Ser 1.30  0.50 - 1.35 (mg/dL)   Glucose, Bld 482 (*) 70 - 99 (mg/dL)   Calcium, Ion 1.10 (*) 1.12 - 1.32 (mmol/L)   TCO2 17  0 - 100 (mmol/L)   Hemoglobin 8.2 (*) 13.0 - 17.0 (g/dL)   HCT 24.0 (*) 39.0 - 52.0 (%)  URINE MICROSCOPIC-ADD ON      Component Value Range   Squamous Epithelial / LPF RARE  RARE  WBC, UA 3-6  <3 (WBC/hpf)   RBC / HPF 0-2  <3 (RBC/hpf)   Bacteria, UA FEW (*) RARE    Urine-Other RARE YEAST    GLUCOSE, CAPILLARY      Component Value Range   Glucose-Capillary 360 (*) 70 - 99 (mg/dL)     Dg Chest Portable 1  View  10/01/2011  *RADIOLOGY REPORT*  Clinical Data: Fever.  PORTABLE CHEST - 1 VIEW  Comparison: None.  Findings: 1746 hours.  The heart size and mediastinal contours are normal for portable technique.  There are patchy left greater than right basilar opacities.  No definite edema, pleural effusion or pneumothorax is identified.  Multiple telemetry leads overlie the chest.  IMPRESSION: Patchy bibasilar opacities may represent atelectasis.  However, in this context, early aspiration pneumonia cannot be excluded. Radiographic followup recommended.  Original Report Authenticated By: Vivia Ewing, M.D.     1. Pneumonia       MDM  Fever, pneumonia,influenza   The chart was scribed for me under my direct supervision.  I personally performed the history, physical, and medical decision making and all procedures in the evaluation of this patient.Zachary Diego, MD 10/01/11 [phone]

## 2011-10-02 ENCOUNTER — Encounter (HOSPITAL_COMMUNITY): Payer: Self-pay | Admitting: *Deleted

## 2011-10-02 DIAGNOSIS — D509 Iron deficiency anemia, unspecified: Secondary | ICD-10-CM | POA: Diagnosis present

## 2011-10-02 DIAGNOSIS — R7881 Bacteremia: Secondary | ICD-10-CM | POA: Diagnosis present

## 2011-10-02 LAB — BASIC METABOLIC PANEL
BUN: 14 mg/dL (ref 6–23)
CO2: 19 mEq/L (ref 19–32)
Calcium: 8.4 mg/dL (ref 8.4–10.5)
Chloride: 101 mEq/L (ref 96–112)
Creatinine, Ser: 1.13 mg/dL (ref 0.50–1.35)
GFR calc Af Amer: 78 mL/min — ABNORMAL LOW (ref >90)
GFR calc non Af Amer: 67 mL/min — ABNORMAL LOW (ref >90)
Glucose, Bld: 309 mg/dL — ABNORMAL HIGH (ref 70–99)
Potassium: 4 mEq/L (ref 3.5–5.1)
Sodium: 135 mEq/L (ref 135–145)

## 2011-10-02 LAB — GLUCOSE, CAPILLARY
Glucose-Capillary: 454 mg/dL — ABNORMAL HIGH (ref 70–99)
Glucose-Capillary: 291 mg/dL — ABNORMAL HIGH (ref 70–99)

## 2011-10-02 LAB — CBC
HCT: 24.2 % — ABNORMAL LOW (ref 39.0–52.0)
Hemoglobin: 7.5 g/dL — ABNORMAL LOW (ref 13.0–17.0)
MCH: 26.3 pg (ref 26.0–34.0)
MCHC: 31 g/dL (ref 30.0–36.0)
MCV: 84.9 fL (ref 78.0–100.0)
Platelets: 171 10*3/uL (ref 150–400)
RBC: 2.85 MIL/uL — ABNORMAL LOW (ref 4.22–5.81)
RDW: 14.9 % (ref 11.5–15.5)
WBC: 13.4 10*3/uL — ABNORMAL HIGH (ref 4.0–10.5)

## 2011-10-02 LAB — GLUCOSE, RANDOM: Glucose, Bld: 352 mg/dL — ABNORMAL HIGH (ref 70–99)

## 2011-10-02 LAB — STREP PNEUMONIAE URINARY ANTIGEN: Strep Pneumo Urinary Antigen: NEGATIVE

## 2011-10-02 MED ORDER — INSULIN ASPART 100 UNIT/ML ~~LOC~~ SOLN
0.0000 [IU] | Freq: Every day | SUBCUTANEOUS | Status: DC
  Administered 2011-10-02 – 2011-10-03 (×2): 3 [IU] via SUBCUTANEOUS
  Administered 2011-10-06: 2 [IU] via SUBCUTANEOUS
  Administered 2011-10-07: 0 [IU] via SUBCUTANEOUS
  Administered 2011-10-08: 2 [IU] via SUBCUTANEOUS

## 2011-10-02 MED ORDER — ACETAMINOPHEN 325 MG PO TABS
650.0000 mg | ORAL_TABLET | Freq: Four times a day (QID) | ORAL | Status: DC | PRN
  Administered 2011-10-02 – 2011-10-04 (×6): 650 mg via ORAL
  Filled 2011-10-02 (×6): qty 2

## 2011-10-02 MED ORDER — INSULIN ASPART 100 UNIT/ML ~~LOC~~ SOLN
0.0000 [IU] | Freq: Three times a day (TID) | SUBCUTANEOUS | Status: DC
  Administered 2011-10-02: 20 [IU] via SUBCUTANEOUS
  Administered 2011-10-03: 4 [IU] via SUBCUTANEOUS
  Administered 2011-10-03: 15 [IU] via SUBCUTANEOUS
  Administered 2011-10-03: 11 [IU] via SUBCUTANEOUS
  Administered 2011-10-04 – 2011-10-06 (×7): 7 [IU] via SUBCUTANEOUS
  Administered 2011-10-06 (×2): 4 [IU] via SUBCUTANEOUS
  Administered 2011-10-07: 7 [IU] via SUBCUTANEOUS
  Administered 2011-10-07: 4 [IU] via SUBCUTANEOUS
  Administered 2011-10-07: 3 [IU] via SUBCUTANEOUS
  Administered 2011-10-08: 7 [IU] via SUBCUTANEOUS
  Filled 2011-10-02: qty 3

## 2011-10-02 MED ORDER — VANCOMYCIN HCL IN DEXTROSE 1-5 GM/200ML-% IV SOLN
1000.0000 mg | Freq: Once | INTRAVENOUS | Status: AC
  Administered 2011-10-02: 1000 mg via INTRAVENOUS
  Filled 2011-10-02 (×2): qty 200

## 2011-10-02 MED ORDER — GLIMEPIRIDE 2 MG PO TABS
4.0000 mg | ORAL_TABLET | Freq: Every day | ORAL | Status: DC
  Administered 2011-10-02 – 2011-10-10 (×9): 4 mg via ORAL
  Filled 2011-10-02 (×4): qty 2
  Filled 2011-10-02: qty 1
  Filled 2011-10-02 (×4): qty 2

## 2011-10-02 MED ORDER — VANCOMYCIN HCL IN DEXTROSE 1-5 GM/200ML-% IV SOLN
1000.0000 mg | Freq: Two times a day (BID) | INTRAVENOUS | Status: DC
  Administered 2011-10-02 – 2011-10-07 (×10): 1000 mg via INTRAVENOUS
  Filled 2011-10-02 (×12): qty 200

## 2011-10-02 NOTE — Progress Notes (Addendum)
ANTIBIOTIC CONSULT NOTE - INITIAL  Pharmacy Consult for Vancomycin Indication: pneumonia & Positive Culture per MD request.  No Known Allergies  Patient Measurements: Height: 5\' 8"  (172.7 cm) Weight: 197 lb 11.2 oz (89.676 kg) IBW/kg (Calculated) : 68.4   Vital Signs: Temp: 101.4 F (38.6 C) (12/06 0453) Temp src: Oral (12/06 0453) BP: 138/80 mmHg (12/06 0453) Pulse Rate: 115  (12/06 0453) Intake/Output from previous day: 12/05 0701 - 12/06 0700 In: 2431.7 [I.V.:2131.7; IV Piggyback:300] Out: 2300 [Urine:2300] Intake/Output from this shift: Total I/O In: 1931.7 [I.V.:1631.7; IV Piggyback:300] Out: 2300 [Urine:2300]  Labs:  [facility] 10/02/11 0454 10/01/11 1807 10/01/11 1736  WBC 13.4* -- 15.1*  HGB 7.5* 8.2* 8.4*  PLT 171 -- 182  LABCREA -- -- --  CREATININE 1.13 1.30 1.20   Estimated Creatinine Clearance: 72.8 ml/min (by C-G formula based on Cr of 1.13). No results found for this basename: VANCOTROUGH:2,VANCOPEAK:2,VANCORANDOM:2,GENTTROUGH:2,GENTPEAK:2,GENTRANDOM:2,TOBRATROUGH:2,TOBRAPEAK:2,TOBRARND:2,AMIKACINPEAK:2,AMIKACINTROU:2,AMIKACIN:2, in the last 72 hours   Microbiology: No results found for this or any previous visit (from the past 720 hour(s)).  Medical History: Past Medical History  Diagnosis Date  . Hypertension   . Diabetes mellitus   . Smoking     Medications:  Anti-infectives     Start     Dose/Rate Route Frequency Ordered Stop   10/01/11 2345   cefTRIAXone (ROCEPHIN) 1 g in dextrose 5 % 50 mL IVPB        1 g 100 mL/hr over 30 Minutes Intravenous Every 24 hours 10/01/11 2335 10/08/11 2344   10/01/11 2345   azithromycin (ZITHROMAX) 500 mg in dextrose 5 % 250 mL IVPB        500 mg 250 mL/hr over 60 Minutes Intravenous Every 24 hours 10/01/11 2335 10/08/11 2344   10/01/11 2138   dextrose 5 % with cefTRIAXone (ROCEPHIN) ADS Med     Comments: Poindexter, MELINDA: cabinet override         10/01/11 2138 10/01/11 2238   10/01/11 2030    azithromycin (ZITHROMAX) 500 mg in dextrose 5 % 250 mL IVPB        500 mg 250 mL/hr over 60 Minutes Intravenous  Once 10/01/11 2021 10/01/11 2335   10/01/11 2030   cefTRIAXone (ROCEPHIN) 1 g in dextrose 5 % 50 mL IVPB        1 g 120 mL/hr over 30 Minutes Intravenous  Once 10/01/11 2021 10/01/11 2238         Assessment: Okay for Protocol   Goal of Therapy:  Eradicate infection. Vancomycin Trough 15-20.  Plan:  Vancomycin 1000mg  IV every 12 hours. Trough at steady state. F/U Micro data. Biagio Quint R 10/02/2011,6:49 AM

## 2011-10-02 NOTE — Progress Notes (Signed)
UR Chart Review Completed  

## 2011-10-02 NOTE — Progress Notes (Signed)
Chart reviewed.  Subjective: Denies cough. Does not check sugars at home. Not eating much. Denies nausea vomiting or diarrhea. Denies bleeding.  Objective: Vital signs in last 24 hours: Filed Vitals:   10/01/11 2215 10/01/11 2330 10/02/11 0453 10/02/11 1100  BP: 143/92 149/83 138/80   Pulse: 119 123 115   Temp: 102.7 F (39.3 C) 103.4 F (39.7 C) 101.4 F (38.6 C) 101.5 F (38.6 C)  TempSrc: Oral Oral Oral   Resp: 18 20 16    Height:  5\' 8"  (1.727 m)    Weight:  86.9 kg (191 lb 9.3 oz) 89.676 kg (197 lb 11.2 oz)   SpO2: 100% 100% 98%    Weight change:   Intake/Output Summary (Last 24 hours) at 10/02/11 1419 Last data filed at 10/02/11 0800  Gross per 24 hour  Intake 2431.67 ml  Output   2300 ml  Net 131.67 ml   Physical Exam: General: Sleepy. Will say much. Cooperative. Weak appearing. Lungs clear to auscultation bilaterally without wheeze rhonchi or rales excellent cardiovascular regular rate rhythm without murmurs gallops rubs Abdomen soft nontender nondistended Extremities no clubbing cyanosis or edema  Lab Results: Basic Metabolic Panel:  Lab [id number] 10/01/11 1807 10/01/11 1736  NA 135 135 --  K 4.0 4.3 --  CL 101 102 --  CO2 19 -- 19  GLUCOSE 309* 482* --  BUN 14 14 --  CREATININE 1.13 1.30 --  CALCIUM 8.4 -- 8.7  MG -- -- --  PHOS -- -- --   Liver Function Tests:  Lab 10/01/11 1736  AST 14  ALT 14  ALKPHOS 79  BILITOT 0.5  PROT 7.4  ALBUMIN 2.8*   No results found for this basename: LIPASE:2,AMYLASE:2 in the last 168 hours No results found for this basename: AMMONIA:2 in the last 168 hours CBC:  Lab 10/02/11 0454 10/01/11 1807 10/01/11 1736  WBC 13.4* -- 15.1*  NEUTROABS -- -- 12.3*  HGB 7.5* 8.2* --  HCT 24.2* 24.0* --  MCV 84.9 -- 85.9  PLT 171 -- 182   Cardiac Enzymes: No results found for this basename: CKTOTAL:3,CKMB:3,CKMBINDEX:3,TROPONINI:3 in the last 168 hours BNP: No results found for this basename: POCBNP:3 in the last  168 hours D-Dimer: No results found for this basename: DDIMER:2 in the last 168 hours CBG:  Lab 10/01/11 2001 10/01/11 1713 09/30/11 1518 09/30/11 1208  GLUCAP 360* 499* 365* 453*   Hemoglobin A1C: No results found for this basename: HGBA1C in the last 168 hours Fasting Lipid Panel: No results found for this basename: CHOL,HDL,LDLCALC,TRIG,CHOLHDL,LDLDIRECT in the last 168 hours Thyroid Function Tests: No results found for this basename: TSH,T4TOTAL,FREET4,T3FREE,THYROIDAB in the last 168 hours Coagulation: No results found for this basename: LABPROT:4,INR:4 in the last 168 hours Anemia Panel: No results found for this basename: VITAMINB12,FOLATE,FERRITIN,TIBC,IRON,RETICCTPCT in the last 168 hours Urine Drug Screen:   Micro Results: Recent Results (from the past 240 hour(s))  CULTURE, BLOOD (ROUTINE X 2)     Status: Normal (Preliminary result)   Collection Time   10/01/11  5:31 PM      Component Value Range Status Comment   Specimen Description BLOOD LEFT HAND DRAWN BY RN   Final    Special Requests     Final    Value: BOTTLES DRAWN AEROBIC AND ANAEROBIC AEB=8CC ANA=6CC   Culture     Final    Value: GRAM POSITIVE COCCI IN CHAINS     Gram Stain Report Called to,Read Back By and Verified With: Morene Antu RN  ON [id number] AT 0730 BY RESSEGGER R     Performed at [facility]   Report Status PENDING   Incomplete   CULTURE, BLOOD (ROUTINE X 2)     Status: Normal (Preliminary result)   Collection Time   10/01/11  5:36 PM      Component Value Range Status Comment   Specimen Description BLOOD RIGHT ANTECUBITAL   Final    Special Requests BOTTLES DRAWN AEROBIC AND ANAEROBIC 6CC   Final    Culture     Final    Value: GRAM POSITIVE COCCI IN CHAINS     Gram Stain Report Called to,Read Back By and Verified With: Morene Antu RN AT [phone] ON [id number] AT 0625 BY RESSEGGER R     Performed at [facility]   Report Status PENDING   Incomplete    Studies/Results: Dg Chest Portable 1  View  10/01/2011  *RADIOLOGY REPORT*  Clinical Data: Fever.  PORTABLE CHEST - 1 VIEW  Comparison: None.  Findings: 1746 hours.  The heart size and mediastinal contours are normal for portable technique.  There are patchy left greater than right basilar opacities.  No definite edema, pleural effusion or pneumothorax is identified.  Multiple telemetry leads overlie the chest.  IMPRESSION: Patchy bibasilar opacities may represent atelectasis.  However, in this context, early aspiration pneumonia cannot be excluded. Radiographic followup recommended.  Original Report Authenticated By: Vivia Ewing, M.D.   Scheduled Meds:   . sodium chloride   Intravenous Once  . acetaminophen      . azithromycin  500 mg Intravenous Once  . azithromycin  500 mg Intravenous Q24H  . cefTRIAXone (ROCEPHIN) 1 GM IVPB  1 g Intravenous Once  . cefTRIAXone (ROCEPHIN)  IV  1 g Intravenous Q24H  . cefTRIAXone (ROCEPHIN) IVPB 1 gram/50 mL D5W      . glimepiride  4 mg Oral Q breakfast  . insulin aspart  0-20 Units Subcutaneous TID WC  . insulin aspart  0-5 Units Subcutaneous QHS  . sodium chloride  1,000 mL Intravenous Once  . sodium chloride  1,000 mL Intravenous Once  . vancomycin  1,000 mg Intravenous Once  . vancomycin  1,000 mg Intravenous Q12H   Continuous Infusions:   . 0.9 % NaCl with KCl 20 mEq / L 75 mL/hr at 10/02/11 0004   PRN Meds:.acetaminophen, DISCONTD: acetaminophen Assessment/Plan: Principal Problem:  *Pneumonia with bacteremia, growing gram-positive cocci in chains. Suspect pneumococcal pneumonia. Continue Rocephin and azithromycin for now. Check urine strep pneumonia antigen. Continue IV fluids and supportive care.    DM (diabetes mellitus), type 2, uncontrolled: Will start Amaryl, check hemoglobin A1c and add sliding scale insulin.   Anemia, microcytic: Earlier this year his hemoglobin was 12. I will check iron TIBC and ferritin as well as Hemoccult of the stool. He has no evidence of active  bleeding. Type and screen packed red blood cells.   LOS: 1 day   Farren Nelles L 10/02/2011, 2:19 PM

## 2011-10-02 NOTE — Progress Notes (Signed)
CRITICAL VALUE ALERT  Critical value received:  Blood culture(+) Anaerobic and Aerobic-Gram positive cocci in chains  Date of notification: 10/02/11   Time of notification: 0634   Critical value read back:yes  Nurse who received alert: Morene Antu, LPN  MD notified (1st page): Dr. Shanon Brow  Time of first [phone]    MD notified (2nd page):  Time of second page:  Responding MD: Shanon Brow  Time MD responded: [phone]

## 2011-10-03 ENCOUNTER — Inpatient Hospital Stay (HOSPITAL_COMMUNITY): Payer: Medicare Other

## 2011-10-03 LAB — LEGIONELLA ANTIGEN, URINE: Legionella Antigen, Urine: NEGATIVE

## 2011-10-03 LAB — DIFFERENTIAL
Basophils Absolute: 0 10*3/uL (ref 0.0–0.1)
Basophils Relative: 0 % (ref 0–1)
Eosinophils Absolute: 0 10*3/uL (ref 0.0–0.7)
Eosinophils Relative: 0 % (ref 0–5)
Lymphocytes Relative: 10 % — ABNORMAL LOW (ref 12–46)
Lymphs Abs: 1.5 10*3/uL (ref 0.7–4.0)
Monocytes Absolute: 1.4 10*3/uL — ABNORMAL HIGH (ref 0.1–1.0)
Monocytes Relative: 10 % (ref 3–12)
Neutro Abs: 11.5 10*3/uL — ABNORMAL HIGH (ref 1.7–7.7)
Neutrophils Relative %: 80 % — ABNORMAL HIGH (ref 43–77)

## 2011-10-03 LAB — GLUCOSE, CAPILLARY
Glucose-Capillary: 266 mg/dL — ABNORMAL HIGH (ref 70–99)
Glucose-Capillary: 320 mg/dL — ABNORMAL HIGH (ref 70–99)
Glucose-Capillary: 198 mg/dL — ABNORMAL HIGH (ref 70–99)
Glucose-Capillary: 265 mg/dL — ABNORMAL HIGH (ref 70–99)

## 2011-10-03 LAB — IRON AND TIBC
Iron: <10 ug/dL — ABNORMAL LOW (ref 42–135)
UIBC: 326 ug/dL (ref 125–400)

## 2011-10-03 LAB — CBC
HCT: 21.3 % — ABNORMAL LOW (ref 39.0–52.0)
Hemoglobin: 6.8 g/dL — CL (ref 13.0–17.0)
MCH: 26.7 pg (ref 26.0–34.0)
MCHC: 31.9 g/dL (ref 30.0–36.0)
MCV: 83.5 fL (ref 78.0–100.0)
Platelets: 171 10*3/uL (ref 150–400)
RBC: 2.55 MIL/uL — ABNORMAL LOW (ref 4.22–5.81)
RDW: 14.7 % (ref 11.5–15.5)
WBC: 14.5 10*3/uL — ABNORMAL HIGH (ref 4.0–10.5)

## 2011-10-03 LAB — ABO/RH: ABO/RH(D): B POS

## 2011-10-03 LAB — HEMOGLOBIN A1C
Hgb A1c MFr Bld: 13.9 % — ABNORMAL HIGH (ref <5.7)
Mean Plasma Glucose: 352 mg/dL — ABNORMAL HIGH (ref <117)

## 2011-10-03 LAB — PREPARE RBC (CROSSMATCH)

## 2011-10-03 LAB — FERRITIN: Ferritin: 72 ng/mL (ref 22–322)

## 2011-10-03 LAB — STREP PNEUMONIAE URINARY ANTIGEN: Strep Pneumo Urinary Antigen: NEGATIVE

## 2011-10-03 LAB — HIV ANTIBODY (ROUTINE TESTING W REFLEX): HIV: NONREACTIVE

## 2011-10-03 LAB — VITAMIN B12: Vitamin B-12: 414 pg/mL (ref 211–911)

## 2011-10-03 MED ORDER — AZITHROMYCIN 250 MG PO TABS
250.0000 mg | ORAL_TABLET | Freq: Every day | ORAL | Status: DC
  Administered 2011-10-03 – 2011-10-04 (×2): 250 mg via ORAL
  Filled 2011-10-03 (×2): qty 1

## 2011-10-03 MED ORDER — INSULIN GLARGINE 100 UNIT/ML ~~LOC~~ SOLN
10.0000 [IU] | Freq: Every day | SUBCUTANEOUS | Status: DC
  Administered 2011-10-03 – 2011-10-06 (×4): 10 [IU] via SUBCUTANEOUS
  Filled 2011-10-03: qty 3

## 2011-10-03 MED ORDER — LIVING WELL WITH DIABETES BOOK
Freq: Once | Status: AC
  Administered 2011-10-03: 1
  Filled 2011-10-03: qty 1

## 2011-10-03 MED ORDER — METFORMIN HCL 500 MG PO TABS
1000.0000 mg | ORAL_TABLET | Freq: Two times a day (BID) | ORAL | Status: DC
  Administered 2011-10-03 – 2011-10-06 (×7): 1000 mg via ORAL
  Filled 2011-10-03 (×3): qty 2
  Filled 2011-10-03: qty 1
  Filled 2011-10-03: qty 2
  Filled 2011-10-03: qty 1
  Filled 2011-10-03: qty 2

## 2011-10-03 NOTE — Progress Notes (Signed)
Subjective: Still no cough. Denies bleeding. Per nursing staff, no bowel movements. Hemoccult of stool is still pending. Has never had upper or lower endoscopy. Has never had a blood transfusion. No other complaints.  Objective: Vital signs in last 24 hours: Filed Vitals:   10/02/11 1638 10/02/11 2119 10/03/11 0538 10/03/11 0658  BP:  136/77 123/70   Pulse:  98 113   Temp: 102.1 F (38.9 C) 99.3 F (37.4 C) 101.8 F (38.8 C) 101.4 F (38.6 C)  TempSrc:  Oral Oral Oral  Resp:  18 20   Height:      Weight:      SpO2:  97% 100%    Weight change:   Intake/Output Summary (Last 24 hours) at 10/03/11 0933 Last data filed at 10/03/11 0841  Gross per 24 hour  Intake   2226 ml  Output   4000 ml  Net  -1774 ml   Physical Exam: General: More alert today, but still does not speak much. Lungs clear to auscultation bilaterally without wheeze rhonchi or rales  cardiovascular regular rate rhythm without murmurs gallops rubs Abdomen soft nontender nondistended Extremities no clubbing cyanosis or edema Skin: No rash.  Lab Results: Basic Metabolic Panel:  Lab [id number] 10/02/11 0454 10/01/11 1807 10/01/11 1736  NA -- 135 135 --  K -- 4.0 4.3 --  CL -- 101 102 --  CO2 -- 19 -- 19  GLUCOSE 352* 309* -- --  BUN -- 14 14 --  CREATININE -- 1.13 1.30 --  CALCIUM -- 8.4 -- 8.7  MG -- -- -- --  PHOS -- -- -- --   Liver Function Tests:  Lab 10/01/11 1736  AST 14  ALT 14  ALKPHOS 79  BILITOT 0.5  PROT 7.4  ALBUMIN 2.8*   No results found for this basename: LIPASE:2,AMYLASE:2 in the last 168 hours No results found for this basename: AMMONIA:2 in the last 168 hours CBC:  Lab 10/03/11 0545 10/02/11 0454 10/01/11 1736  WBC 14.5* 13.4* --  NEUTROABS 11.5* -- 12.3*  HGB 6.8* 7.5* --  HCT 21.3* 24.2* --  MCV 83.5 84.9 --  PLT 171 171 --   Cardiac Enzymes: No results found for this basename: CKTOTAL:3,CKMB:3,CKMBINDEX:3,TROPONINI:3 in the last 168 hours BNP: No results  found for this basename: POCBNP:3 in the last 168 hours D-Dimer: No results found for this basename: DDIMER:2 in the last 168 hours CBG:  Lab 10/03/11 0753 10/02/11 2000 10/02/11 1635 10/01/11 2001 10/01/11 1713 09/30/11 1518  GLUCAP 266* 291* 454* 360* 499* 365*   Hemoglobin A1C:  Lab 10/01/11 1727  HGBA1C 13.9*   Fasting Lipid Panel: No results found for this basename: CHOL,HDL,LDLCALC,TRIG,CHOLHDL,LDLDIRECT in the last 168 hours Thyroid Function Tests: No results found for this basename: TSH,T4TOTAL,FREET4,T3FREE,THYROIDAB in the last 168 hours Coagulation: No results found for this basename: LABPROT:4,INR:4 in the last 168 hours Anemia Panel:  Lab 10/01/11 1727  VITAMINB12 --  FOLATE --  FERRITIN 72  TIBC Not calculated due to Iron <10.  IRON <10*  RETICCTPCT --   HIV nonreactive  Urine strep pneumonia antigen negative  Micro Results: Recent Results (from the past 240 hour(s))  CULTURE, BLOOD (ROUTINE X 2)     Status: Normal (Preliminary result)   Collection Time   10/01/11  5:31 PM      Component Value Range Status Comment   Specimen Description BLOOD LEFT HAND DRAWN BY RN   Final    Special Requests     Final  Value: BOTTLES DRAWN AEROBIC AND ANAEROBIC AEB=8CC ANA=6CC   Setup Time [id number]   Final    Culture     Final    Value: GRAM POSITIVE COCCI IN CHAINS     Note: Gram Stain Report Called to,Read Back By and Verified With: LATOYA FOOTE RN ON 10/02/11 @ 0730 BY RESSEGGER R Performed at [facility]   Report Status PENDING   Incomplete   CULTURE, BLOOD (ROUTINE X 2)     Status: Normal (Preliminary result)   Collection Time   10/01/11  5:36 PM      Component Value Range Status Comment   Specimen Description BLOOD RIGHT ANTECUBITAL   Final    Special Requests BOTTLES DRAWN AEROBIC AND ANAEROBIC [facility]   Final    Setup Time [id number]   Final    Culture     Final    Value: GRAM POSITIVE COCCI IN CHAINS     Note: Gram Stain Report Called to,Read  Back By and Verified With: Morene Antu RN @ [phone] ON 10/02/11 BY RESSEGGER R Performed at [facility]   Report Status PENDING   Incomplete   CULTURE, BLOOD (ROUTINE X 2)     Status: Normal (Preliminary result)   Collection Time   10/03/11  6:50 AM      Component Value Range Status Comment   Specimen Description Peripheral RIGHT ARM   Final    Special Requests Normal 6CC   Final    Culture PENDING   Incomplete    Report Status PENDING   Incomplete   CULTURE, BLOOD (ROUTINE X 2)     Status: Normal (Preliminary result)   Collection Time   10/03/11  6:53 AM      Component Value Range Status Comment   Specimen Description Peripheral LEFT ARM   Final    Special Requests Normal [facility]   Final    Culture PENDING   Incomplete    Report Status PENDING   Incomplete    Studies/Results: Dg Chest Portable 1 View  10/01/2011  *RADIOLOGY REPORT*  Clinical Data: Fever.  PORTABLE CHEST - 1 VIEW  Comparison: None.  Findings: 1746 hours.  The heart size and mediastinal contours are normal for portable technique.  There are patchy left greater than right basilar opacities.  No definite edema, pleural effusion or pneumothorax is identified.  Multiple telemetry leads overlie the chest.  IMPRESSION: Patchy bibasilar opacities may represent atelectasis.  However, in this context, early aspiration pneumonia cannot be excluded. Radiographic followup recommended.  Original Report Authenticated By: Vivia Ewing, M.D.   Scheduled Meds:    . azithromycin  250 mg Oral Daily  . cefTRIAXone (ROCEPHIN)  IV  1 g Intravenous Q24H  . glimepiride  4 mg Oral Q breakfast  . insulin aspart  0-20 Units Subcutaneous TID WC  . insulin aspart  0-5 Units Subcutaneous QHS  . insulin glargine  10 Units Subcutaneous Daily  . metFORMIN  1,000 mg Oral BID WC  . vancomycin  1,000 mg Intravenous Once  . vancomycin  1,000 mg Intravenous Q12H  . DISCONTD: azithromycin  500 mg Intravenous Q24H   Continuous Infusions:    . 0.9  % NaCl with KCl 20 mEq / [name] 75 mL/hr at 10/02/11 0004   PRN Meds:.acetaminophen Assessment/Plan:  Fever and leukocytosis continues he has been on azithromycin and ceftriaxone since admission. Vancomycin since yesterday morning. Chest x-ray shows possible infiltrate, but patient has no cough or shortness of breath and has  clear lung sounds. Preliminary blood cultures from admission positive for gram-positive cocci in chains. Repeat blood cultures drawn today are pending. Urine strep pneumonia antigen is negative. I will repeat the chest x-ray. Continue current antibiotics and followup blood culture results. Flu PCR was negative. UA was essentially negative, and HIV nonreactive.   DM (diabetes mellitus), type 2, uncontrolled: Hemoglobin A1c was almost 14. Patient never checks his blood glucoses at home and is only on 500 mg of metformin twice a day. Patient has remained on resistant sliding scale NovoLog. Amaryl 4 mg was added yesterday. I doubt patient would be compliant with insulin at home, as he does not even check his blood glucoses. I will get a diabetic educator consult for patient education. Resume metformin but increased her thousand milligrams twice a day. Add Lantus 10 mg a day.   Anemia, iron deficiency: Patient has no active bleeding currently, and his hemoglobin dropped may be somewhat in part dilutional. Hemoccult of the stools are pending. However, he will need an elective colonoscopy and/or upper endoscopy once his fever stabilizes. I will transfuse 2 units of packed red blood cells. Hold off on oral iron supplementation for now, but ultimately he will need iron therapy and further workup. He has not been getting any heparin products, just sequential compression devices.   LOS: 2 days   Zachary [name] 10/03/2011, 9:33 AM

## 2011-10-03 NOTE — Progress Notes (Signed)
Writer put 2 videos on for pt and asked if he had any questions and pt stated no.  Pt expressed little interest in discussing diabetes.  Writer reviewed some [facility] notes on diabetes as well, stated he understood and had no questions.

## 2011-10-03 NOTE — Progress Notes (Signed)
CRITICAL VALUE ALERT  Critical value received:  Hemoglobin 6.8  Date of notification:  10/03/11  Time of notification:  0624  Critical value read back:yes  Nurse who received alert:  P.J. Linus Mako, RN  MD notified (1st page):  Dr. Shanon Brow  Time of first page:  0626  MD notified (2nd page):  Time of second page:  Responding MD:  Dr. Shanon Brow  Time MD responded:  [phone]

## 2011-10-03 NOTE — Plan of Care (Signed)
Problem: Food- and Nutrition-Related Knowledge Deficit (NB-1.1) Goal: Nutrition education Formal process to instruct or train a patient/client in a skill or to impart knowledge to help patients/clients voluntarily manage or modify food choices and eating behavior to maintain or improve health.  -Pt provided Guidelines for following CHO Modified diet. We discussed the importance of portion control, reviewed CHO food lists and label reading. Handout: Carbohydrate Counting included. Pt expressed understanding. -Pt provided Guidelines for following CHO Modified diet. We discussed the importance of portion control, reviewed CHO food lists and label reading. Handout: Carbohydrate Counting included. Pt expressed understanding.

## 2011-10-03 NOTE — Progress Notes (Signed)
Dr. Shanon Brow notified of hemoglobin of 6.8 and fever of 101.8.  Order for repeat blood cultures x 2 received.  No other orders received.

## 2011-10-03 NOTE — Progress Notes (Signed)
PT TO RECEIVE 2 UNITS OF PRBC'S AND HAS A TEMP OF 101.0 ORAL.  WRITER CALLED TO MAKE DR. SULLIVAN AWARE AND STATED TO GO AHEAD AND PROCEED WITH BLOOD BECAUSE PT REALLY NEEDS IT AND HAS BEEN FEBRILE WHICH MAY CONTINUE.

## 2011-10-03 NOTE — Progress Notes (Signed)
Patient with elevated HgbA1c: 13.9%.  Not check glucose at home and has not taken Metformin in over a month.  Agree that patient may be resistant to giving insulin at home based on history of non-compliance with blood glucose monitoring and taking medications, however, patient unlikely to achieve good HgbA1C without insulin.  Agree with addition of Amaryl 4 mg daily and have patient follow up with a primary MD.  No PCP, please consult care management to help set up PCP and medication assistance.  Will have patient watch DM videos and give booklet on Diabetes from pharmacy.  Dietitian to see patient regarding diet education and outpatient follow-up as well.

## 2011-10-04 LAB — CULTURE, BLOOD (ROUTINE X 2)
Culture  Setup Time: 201212061456
Culture  Setup Time: 201212061454

## 2011-10-04 LAB — CBC
HCT: 28.7 % — ABNORMAL LOW (ref 39.0–52.0)
Hemoglobin: 9.4 g/dL — ABNORMAL LOW (ref 13.0–17.0)
MCH: 26.9 pg (ref 26.0–34.0)
MCHC: 32.8 g/dL (ref 30.0–36.0)
MCV: 82 fL (ref 78.0–100.0)
Platelets: 192 10*3/uL (ref 150–400)
RBC: 3.5 MIL/uL — ABNORMAL LOW (ref 4.22–5.81)
RDW: 14.7 % (ref 11.5–15.5)
WBC: 16.8 10*3/uL — ABNORMAL HIGH (ref 4.0–10.5)

## 2011-10-04 LAB — BASIC METABOLIC PANEL
BUN: 12 mg/dL (ref 6–23)
CO2: 23 mEq/L (ref 19–32)
Calcium: 8.8 mg/dL (ref 8.4–10.5)
Chloride: 97 mEq/L (ref 96–112)
Creatinine, Ser: 0.99 mg/dL (ref 0.50–1.35)
GFR calc Af Amer: >90 mL/min (ref >90)
GFR calc non Af Amer: 85 mL/min — ABNORMAL LOW (ref >90)
Glucose, Bld: 229 mg/dL — ABNORMAL HIGH (ref 70–99)
Potassium: 3.5 mEq/L (ref 3.5–5.1)
Sodium: 132 mEq/L — ABNORMAL LOW (ref 135–145)

## 2011-10-04 LAB — TYPE AND SCREEN
ABO/RH(D): B POS
Antibody Screen: NEGATIVE
Unit division: 0
Unit division: 0

## 2011-10-04 LAB — GLUCOSE, CAPILLARY
Glucose-Capillary: 228 mg/dL — ABNORMAL HIGH (ref 70–99)
Glucose-Capillary: 213 mg/dL — ABNORMAL HIGH (ref 70–99)
Glucose-Capillary: 207 mg/dL — ABNORMAL HIGH (ref 70–99)
Glucose-Capillary: 185 mg/dL — ABNORMAL HIGH (ref 70–99)

## 2011-10-04 LAB — VANCOMYCIN, TROUGH: Vancomycin Tr: 14.6 ug/mL (ref 10.0–20.0)

## 2011-10-04 MED ORDER — POTASSIUM CHLORIDE IN NACL 20-0.9 MEQ/L-% IV SOLN
INTRAVENOUS | Status: DC
  Administered 2011-10-04: 950 mL via INTRAVENOUS
  Administered 2011-10-05: 10:00:00 via INTRAVENOUS
  Administered 2011-10-06 – 2011-10-08 (×2): 1000 mL via INTRAVENOUS

## 2011-10-04 MED ORDER — POTASSIUM CHLORIDE CRYS ER 20 MEQ PO TBCR
20.0000 meq | EXTENDED_RELEASE_TABLET | Freq: Two times a day (BID) | ORAL | Status: AC
  Administered 2011-10-04 (×2): 20 meq via ORAL
  Filled 2011-10-04 (×2): qty 1

## 2011-10-04 MED ORDER — LEVOFLOXACIN IN D5W 750 MG/150ML IV SOLN
750.0000 mg | INTRAVENOUS | Status: DC
  Administered 2011-10-04 – 2011-10-07 (×4): 750 mg via INTRAVENOUS
  Filled 2011-10-04 (×7): qty 150

## 2011-10-04 MED ORDER — SENNOSIDES-DOCUSATE SODIUM 8.6-50 MG PO TABS
1.0000 | ORAL_TABLET | Freq: Two times a day (BID) | ORAL | Status: DC
  Administered 2011-10-04 – 2011-10-09 (×11): 1 via ORAL
  Filled 2011-10-04 (×11): qty 1

## 2011-10-04 NOTE — Progress Notes (Signed)
During assessment, RN noticed a bump where the patient's umbilicus is. It is large in size. Patient did not complain of any pain upon palpation.

## 2011-10-04 NOTE — Progress Notes (Signed)
Patient's had a fever of 102.6 Doctor was notified. Patient was given 650mg  of tylenol. Will continue to monitor. No new orders given.

## 2011-10-04 NOTE — Progress Notes (Signed)
Subjective: The patient denies cough and pleurisy. He denies nausea vomiting and diarrhea. He denies pain with urination.  Objective: Vital signs in last 24 hours: Filed Vitals:   10/03/11 1850 10/03/11 2144 10/04/11 0156 10/04/11 0602  BP: 125/72 118/73 151/83 126/74  Pulse: 116 94 103 95  Temp: 102 F (38.9 C) 98.6 F (37 C) 102.6 F (39.2 C) 98.7 F (37.1 C)  TempSrc: Oral Oral Oral Oral  Resp: 20 20 20 16   Height:      Weight:    86 kg (189 lb 9.5 oz)  SpO2:  98% 96% 98%   Weight change:   Intake/Output Summary (Last 24 hours) at 10/04/11 1203 Last data filed at 10/04/11 0200  Gross per 24 hour  Intake   2315 ml  Output   2200 ml  Net    115 ml   Physical Exam: General: No acute distress. Lungs clear to auscultation bilaterally, except decreased breath sounds in the bases. cardiovascular regular rate rhythm without murmurs gallops rubs Abdomen soft nontender nondistended Extremities no clubbing cyanosis or edema Skin: No rash.  Lab Results: Basic Metabolic Panel:  Lab [id number] 10/02/11 1642 10/02/11 0454  NA 132* -- 135  K 3.5 -- 4.0  CL 97 -- 101  CO2 23 -- 19  GLUCOSE 229* 352* --  BUN 12 -- 14  CREATININE 0.99 -- 1.13  CALCIUM 8.8 -- 8.4  MG -- -- --  PHOS -- -- --   Liver Function Tests:  Lab 10/01/11 1736  AST 14  ALT 14  ALKPHOS 79  BILITOT 0.5  PROT 7.4  ALBUMIN 2.8*   No results found for this basename: LIPASE:2,AMYLASE:2 in the last 168 hours No results found for this basename: AMMONIA:2 in the last 168 hours CBC:  Lab 10/04/11 0659 10/03/11 0545 10/01/11 1736  WBC 16.8* 14.5* --  NEUTROABS -- 11.5* 12.3*  HGB 9.4* 6.8* --  HCT 28.7* 21.3* --  MCV 82.0 83.5 --  PLT 192 171 --   Cardiac Enzymes: No results found for this basename: CKTOTAL:3,CKMB:3,CKMBINDEX:3,TROPONINI:3 in the last 168 hours BNP: No results found for this basename: POCBNP:3 in the last 168 hours D-Dimer: No results found for this basename: DDIMER:2 in  the last 168 hours CBG:  Lab 10/04/11 0742 10/03/11 2043 10/03/11 1611 10/03/11 1116 10/03/11 0753 10/02/11 2000  GLUCAP 228* 265* 198* 320* 266* 291*   Hemoglobin A1C:  Lab 10/01/11 1727  HGBA1C 13.9*   Fasting Lipid Panel: No results found for this basename: CHOL,HDL,LDLCALC,TRIG,CHOLHDL,LDLDIRECT in the last 168 hours Thyroid Function Tests: No results found for this basename: TSH,T4TOTAL,FREET4,T3FREE,THYROIDAB in the last 168 hours Coagulation: No results found for this basename: LABPROT:4,INR:4 in the last 168 hours Anemia Panel:  Lab 10/03/11 0545 10/01/11 1727  VITAMINB12 414 --  FOLATE -- --  FERRITIN -- 72  TIBC -- Not calculated due to Iron <10.  IRON -- <10*  RETICCTPCT -- --   HIV nonreactive  Urine strep pneumonia antigen negative  Micro Results: Recent Results (from the past 240 hour(s))  CULTURE, BLOOD (ROUTINE X 2)     Status: Normal   Collection Time   10/01/11  5:31 PM      Component Value Range Status Comment   Specimen Description BLOOD LEFT HAND DRAWN BY RN   Final    Special Requests     Final    Value: BOTTLES DRAWN AEROBIC AND ANAEROBIC AEB=8CC ANA=6CC   Setup Time [id number]   Final  Culture     Final    Value: GROUP B STREP(S.AGALACTIAE)ISOLATED     Note: Gram Stain Report Called to,Read Back By and Verified With: LATOYA FOOTE RN ON 10/02/11 @ 0730 BY RESSEGGER R Performed at [facility]   Report Status 10/04/2011 FINAL   Final    Organism ID, Bacteria GROUP B STREP(S.AGALACTIAE)ISOLATED   Final   CULTURE, BLOOD (ROUTINE X 2)     Status: Normal   Collection Time   10/01/11  5:36 PM      Component Value Range Status Comment   Specimen Description BLOOD RIGHT ANTECUBITAL   Final    Special Requests BOTTLES DRAWN AEROBIC AND ANAEROBIC [facility]   Final    Setup Time [id number]   Final    Culture     Final    Value: GROUP B STREP(S.AGALACTIAE)ISOLATED     Note: SUSCEPTIBILITIES PERFORMED ON PREVIOUS CULTURE WITHIN THE LAST 5 DAYS.      Note: Gram Stain Report Called to,Read Back By and Verified With: Morene Antu RN @ [phone] ON 10/02/11 BY RESSEGGER R Performed at [facility]   Report Status 10/04/2011 FINAL   Final   CULTURE, BLOOD (ROUTINE X 2)     Status: Normal (Preliminary result)   Collection Time   10/03/11  6:50 AM      Component Value Range Status Comment   Specimen Description Peripheral RIGHT ARM   Final    Special Requests Normal 6CC   Final    Culture NO GROWTH 1 DAY   Final    Report Status PENDING   Incomplete   CULTURE, BLOOD (ROUTINE X 2)     Status: Normal (Preliminary result)   Collection Time   10/03/11  6:53 AM      Component Value Range Status Comment   Specimen Description Peripheral LEFT ARM   Final    Special Requests Normal 6CC   Final    Culture NO GROWTH 1 DAY   Final    Report Status PENDING   Incomplete    Studies/Results: Dg Chest 2 View  10/04/2011  *RADIOLOGY REPORT*  Clinical Data: Fever.  Follow up bibasilar atelectasis versus pneumonia.  CHEST - 2 VIEW 10/03/2011:  Comparison: Portable chest x-ray 10/01/2011 [facility].  Findings: Cardiomediastinal silhouette unremarkable and unchanged. Suboptimal inspiration.  Resolution of the patchy airspace opacities at the right lung base.  Persistent focal airspace consolidation in the medial left lower lobe.  Lungs otherwise clear.  Possible small bilateral pleural effusions.  Mild degenerative changes involving the thoracic spine.  IMPRESSION: Suboptimal inspiration.  Resolution of right basilar atelectasis versus pneumonia.  Persistent left lower lobe pneumonia.  No new abnormalities.  Original Report Authenticated By: Deniece Portela, M.D.   Scheduled Meds:    . cefTRIAXone (ROCEPHIN)  IV  1 g Intravenous Q24H  . glimepiride  4 mg Oral Q breakfast  . insulin aspart  0-20 Units Subcutaneous TID WC  . insulin aspart  0-5 Units Subcutaneous QHS  . insulin glargine  10 Units Subcutaneous Daily  . levofloxacin (LEVAQUIN) IV   750 mg Intravenous Q24H  . living well with diabetes book   Does not apply Once  . metFORMIN  1,000 mg Oral BID WC  . senna-docusate  1 tablet Oral BID  . vancomycin  1,000 mg Intravenous Q12H  . DISCONTD: azithromycin  250 mg Oral Daily   Continuous Infusions:    . 0.9 % NaCl with KCl 20 mEq / L  PRN Meds:.acetaminophen Assessment/Plan:  1. Group B strep bacteremia, confirmed via of blood cultures. Presumably this is from pneumonia. His chest x-ray revealed persistent left lower lobe pneumonia. HIV was nonreactive. The influenza PCR was negative. His initial urinalysis was essentially unremarkable. His urine strep pneumo antigen was also negative. He continues to run a high-grade fever intermittently and his white blood cell count did increase from yesterday. He is currently on azithromycin and ceftriaxone. Vancomycin was recently added. We will discontinue azithromycin in favor of Levaquin. We'll continue ceftriaxone and Levaquin. The antibiotics previously prescribed should cover group B strep. All order another urinalysis to assess.    DM (diabetes mellitus), type 2, uncontrolled: Hemoglobin A1c was almost 14. Patient never checks his blood glucoses at home and was only on 500 mg of metformin twice a day. Patient has remained on resistant sliding scale NovoLog. Amaryl 4 mg and Lantus were added. I doubt patient would be compliant with insulin at home, as he does not even check his blood glucoses. The registered dietitian's assessment and recommendations noted and appreciated.   Anemia, iron deficiency: He is status post 2 units of packed red blood cell transfusions. His hemoglobin improved appropriately. Patient has no active bleeding currently, and his hemoglobin drop may be somewhat in part dilutional. Hemoccult of the stools are pending. However, he will need an elective colonoscopy and/or upper endoscopy once his fever stabilizes. Hold off on oral iron supplementation for now, but  ultimately he will need iron therapy and further workup.  Mild hyponatremia. Given his fever, will restart gentle IV fluids.  Question mild mental retardation. Will ask for a social worker/case management consultation to evaluate or look into living environment and possible lack of understanding regarding medical treatment.  We will assess his thyroid function with TSH and free T4.   LOS: 3 days   Leisa Gault 10/04/2011, 12:03 PM

## 2011-10-04 NOTE — Progress Notes (Signed)
ANTIBIOTIC CONSULT NOTE   Pharmacy Consult for Vancomycin Indication: pneumonia & Positive Culture per MD request.  No Known Allergies  Patient Measurements: Height: 5\' 8"  (172.7 cm) Weight: 189 lb 9.5 oz (86 kg) IBW/kg (Calculated) : 68.4   Vital Signs: Temp: 98.7 F (37.1 C) (12/08 0602) Temp src: Oral (12/08 0602) BP: 126/74 mmHg (12/08 0602) Pulse Rate: 95  (12/08 0602) Intake/Output from previous day: 12/07 0701 - 12/08 0700 In: 2515 [P.O.:440; I.V.:1000; Blood:625; IV [id number] Out: 2200 [id number] Intake/Output from this shift:    Labs:  Basename 10/04/11 0659 10/03/11 0545 10/02/11 0454 10/01/11 1807  WBC 16.8* 14.5* 13.4* --  HGB 9.4* 6.8* 7.5* --  PLT 192 171 171 --  LABCREA -- -- -- --  CREATININE 0.99 -- 1.13 1.30   Estimated Creatinine Clearance: 81.5 ml/min (by C-G formula based on Cr of 0.99).  Basename 10/04/11 0659  VANCOTROUGH 14.6  VANCOPEAK --  [place] --  GENTTROUGH --  GENTPEAK --  GENTRANDOM --  TOBRATROUGH --  [place] --  TOBRARND --  [place] --  AMIKACIN --     Microbiology: Recent Results (from the past 720 hour(s))  CULTURE, BLOOD (ROUTINE X 2)     Status: Normal (Preliminary result)   Collection Time   10/01/11  5:31 PM      Component Value Range Status Comment   Specimen Description BLOOD LEFT HAND DRAWN BY RN   Final    Special Requests     Final    Value: BOTTLES DRAWN AEROBIC AND ANAEROBIC AEB=8CC ANA=6CC   Setup Time [id number]   Final    Culture     Final    Value: GROUP B STREP(S.AGALACTIAE)ISOLATED     Note: Gram Stain Report Called to,Read Back By and Verified With: LATOYA FOOTE RN ON 10/02/11 @ 0730 BY RESSEGGER R Performed at [facility]   Report Status PENDING   Incomplete   CULTURE, BLOOD (ROUTINE X 2)     Status: Normal (Preliminary result)   Collection Time   10/01/11  5:36 PM      Component Value Range Status Comment   Specimen Description BLOOD RIGHT ANTECUBITAL    Final    Special Requests BOTTLES DRAWN AEROBIC AND ANAEROBIC [facility]   Final    Setup Time [id number]   Final    Culture     Final    Value: GROUP B STREP(S.AGALACTIAE)ISOLATED     Note: Gram Stain Report Called to,Read Back By and Verified With: Morene Antu RN @ [phone] ON 10/02/11 BY RESSEGGER R Performed at [facility]   Report Status PENDING   Incomplete   CULTURE, BLOOD (ROUTINE X 2)     Status: Normal (Preliminary result)   Collection Time   10/03/11  6:50 AM      Component Value Range Status Comment   Specimen Description Peripheral RIGHT ARM   Final    Special Requests Normal 6CC   Final    Culture NO GROWTH 1 DAY   Final    Report Status PENDING   Incomplete   CULTURE, BLOOD (ROUTINE X 2)     Status: Normal (Preliminary result)   Collection Time   10/03/11  6:53 AM      Component Value Range Status Comment   Specimen Description Peripheral LEFT ARM   Final    Special Requests Normal 6CC   Final    Culture NO GROWTH 1 DAY   Final    Report Status  PENDING   Incomplete     Medical History: Past Medical History  Diagnosis Date  . Hypertension   . Diabetes mellitus   . Smoking     Medications:  Anti-infectives     Start     Dose/Rate Route Frequency Ordered Stop   10/03/11 1000   azithromycin (ZITHROMAX) tablet 250 mg        250 mg Oral Daily 10/03/11 0932 10/06/11 0959   10/02/11 2000   vancomycin (VANCOCIN) IVPB 1000 mg/200 mL premix        1,000 mg 200 mL/hr over 60 Minutes Intravenous Every 12 hours 10/02/11 0714     10/02/11 0700   vancomycin (VANCOCIN) IVPB 1000 mg/200 mL premix        1,000 mg 200 mL/hr over 60 Minutes Intravenous  Once 10/02/11 0658 10/02/11 0950   10/01/11 2345   cefTRIAXone (ROCEPHIN) 1 g in dextrose 5 % 50 mL IVPB        1 g 100 mL/hr over 30 Minutes Intravenous Every 24 hours 10/01/11 2335 10/08/11 2344   10/01/11 2345   azithromycin (ZITHROMAX) 500 mg in dextrose 5 % 250 mL IVPB  Status:  Discontinued        500 mg 250 mL/hr  over 60 Minutes Intravenous Every 24 hours 10/01/11 2335 10/03/11 0932   10/01/11 2138   dextrose 5 % with cefTRIAXone (ROCEPHIN) ADS Med     Comments: Poindexter, MELINDA: cabinet override         10/01/11 2138 10/01/11 2238   10/01/11 2030   azithromycin (ZITHROMAX) 500 mg in dextrose 5 % 250 mL IVPB        500 mg 250 mL/hr over 60 Minutes Intravenous  Once 10/01/11 2021 10/01/11 2335   10/01/11 2030   cefTRIAXone (ROCEPHIN) 1 g in dextrose 5 % 50 mL IVPB        1 g 120 mL/hr over 30 Minutes Intravenous  Once 10/01/11 2021 10/01/11 2238         Assessment: Trough level = 14.6 and anticipate some accumulation therefore level is acceptable   Goal of Therapy:  Eradicate infection. Vancomycin Trough 15-20.  Plan:  Continue Vancomycin 1000mg  IV every 12 hours. Labs per protocol  Hart Robinsons A 10/04/2011,9:50 AM

## 2011-10-05 ENCOUNTER — Inpatient Hospital Stay (HOSPITAL_COMMUNITY): Payer: Medicare Other

## 2011-10-05 LAB — BASIC METABOLIC PANEL
BUN: 11 mg/dL (ref 6–23)
CO2: 24 mEq/L (ref 19–32)
Calcium: 8.8 mg/dL (ref 8.4–10.5)
Chloride: 98 mEq/L (ref 96–112)
Creatinine, Ser: 1.07 mg/dL (ref 0.50–1.35)
GFR calc Af Amer: 83 mL/min — ABNORMAL LOW (ref >90)
GFR calc non Af Amer: 72 mL/min — ABNORMAL LOW (ref >90)
Glucose, Bld: 216 mg/dL — ABNORMAL HIGH (ref 70–99)
Potassium: 4.1 mEq/L (ref 3.5–5.1)
Sodium: 131 mEq/L — ABNORMAL LOW (ref 135–145)

## 2011-10-05 LAB — URINALYSIS, ROUTINE W REFLEX MICROSCOPIC
Bilirubin Urine: NEGATIVE
Glucose, UA: 500 mg/dL — AB
Leukocytes, UA: NEGATIVE
Nitrite: NEGATIVE
Protein, ur: NEGATIVE mg/dL
Specific Gravity, Urine: 1.01 (ref 1.005–1.030)
Urobilinogen, UA: 0.2 mg/dL (ref 0.0–1.0)
pH: 5.5 (ref 5.0–8.0)

## 2011-10-05 LAB — CBC
HCT: 27.2 % — ABNORMAL LOW (ref 39.0–52.0)
Hemoglobin: 8.9 g/dL — ABNORMAL LOW (ref 13.0–17.0)
MCH: 26.9 pg (ref 26.0–34.0)
MCHC: 32.7 g/dL (ref 30.0–36.0)
MCV: 82.2 fL (ref 78.0–100.0)
Platelets: 202 10*3/uL (ref 150–400)
RBC: 3.31 MIL/uL — ABNORMAL LOW (ref 4.22–5.81)
RDW: 14.6 % (ref 11.5–15.5)
WBC: 11.6 10*3/uL — ABNORMAL HIGH (ref 4.0–10.5)

## 2011-10-05 LAB — URINE MICROSCOPIC-ADD ON

## 2011-10-05 LAB — GLUCOSE, CAPILLARY
Glucose-Capillary: 209 mg/dL — ABNORMAL HIGH (ref 70–99)
Glucose-Capillary: 201 mg/dL — ABNORMAL HIGH (ref 70–99)
Glucose-Capillary: 121 mg/dL — ABNORMAL HIGH (ref 70–99)
Glucose-Capillary: 92 mg/dL (ref 70–99)

## 2011-10-05 LAB — TSH: TSH: 1.7 u[IU]/mL (ref 0.350–4.500)

## 2011-10-05 LAB — T4, FREE: Free T4: 0.99 ng/dL (ref 0.80–1.80)

## 2011-10-05 MED ORDER — POLYETHYLENE GLYCOL 3350 17 G PO PACK
17.0000 g | PACK | Freq: Every day | ORAL | Status: DC
  Administered 2011-10-05 – 2011-10-09 (×5): 17 g via ORAL
  Filled 2011-10-05 (×5): qty 1

## 2011-10-05 MED ORDER — IOHEXOL 300 MG/ML  SOLN
100.0000 mL | Freq: Once | INTRAMUSCULAR | Status: AC | PRN
  Administered 2011-10-05: 100 mL via INTRAVENOUS

## 2011-10-05 MED ORDER — SODIUM CHLORIDE 0.9 % IJ SOLN
INTRAMUSCULAR | Status: AC
  Filled 2011-10-05: qty 3

## 2011-10-05 NOTE — Progress Notes (Signed)
Subjective: The patient denies cough and pleurisy. He denies nausea vomiting and diarrhea. He denies pain with urination. He has not had a bowel movement in several days.  Objective: Vital signs in last 24 hours: Filed Vitals:   10/04/11 2141 10/05/11 0200 10/05/11 0600 10/05/11 1000  BP: 121/73 114/71 124/71 126/72  Pulse: 94 100 93 94  Temp: 99.9 F (37.7 C) 100.8 F (38.2 C) 100.2 F (37.9 C) 100 F (37.8 C)  TempSrc: Oral Oral Oral   Resp: 16 18 16 20   Height:      Weight:      SpO2: 99% 99% 98% 100%   Weight change:   Intake/Output Summary (Last 24 hours) at 10/05/11 1405 Last data filed at 10/04/11 2100  Gross per 24 hour  Intake    240 ml  Output      0 ml  Net    240 ml   Physical Exam: General: No acute distress. Lungs clear to auscultation bilaterally, except decreased breath sounds in the bases. cardiovascular regular rate rhythm without murmurs gallops rubs Abdomen soft nontender nondistended Extremities no clubbing cyanosis or edema Skin: Left arm IV site with erythema, nontender.  Lab Results: Basic Metabolic Panel:  Lab [id number] 10/04/11 0659  NA 131* 132*  K 4.1 3.5  CL 98 97  CO2 24 23  GLUCOSE 216* 229*  BUN 11 12  CREATININE 1.07 0.99  CALCIUM 8.8 8.8  MG -- --  PHOS -- --   Liver Function Tests:  Lab 10/01/11 1736  AST 14  ALT 14  ALKPHOS 79  BILITOT 0.5  PROT 7.4  ALBUMIN 2.8*   No results found for this basename: LIPASE:2,AMYLASE:2 in the last 168 hours No results found for this basename: AMMONIA:2 in the last 168 hours CBC:  Lab 10/05/11 0622 10/04/11 0659 10/03/11 0545 10/01/11 1736  WBC 11.6* 16.8* -- --  NEUTROABS -- -- 11.5* 12.3*  HGB 8.9* 9.4* -- --  HCT 27.2* 28.7* -- --  MCV 82.2 82.0 -- --  PLT 202 192 -- --   Cardiac Enzymes: No results found for this basename: CKTOTAL:3,CKMB:3,CKMBINDEX:3,TROPONINI:3 in the last 168 hours BNP: No results found for this basename: POCBNP:3 in the last 168  hours D-Dimer: No results found for this basename: DDIMER:2 in the last 168 hours CBG:  Lab 10/05/11 1131 10/05/11 0752 10/04/11 2044 10/04/11 1747 10/04/11 1146 10/04/11 0742  GLUCAP 201* 209* 185* 207* 213* 228*   Hemoglobin A1C:  Lab 10/01/11 1727  HGBA1C 13.9*   Fasting Lipid Panel: No results found for this basename: CHOL,HDL,LDLCALC,TRIG,CHOLHDL,LDLDIRECT in the last 168 hours Thyroid Function Tests: No results found for this basename: TSH,T4TOTAL,FREET4,T3FREE,THYROIDAB in the last 168 hours Coagulation: No results found for this basename: LABPROT:4,INR:4 in the last 168 hours Anemia Panel:  Lab 10/03/11 0545 10/01/11 1727  VITAMINB12 414 --  FOLATE -- --  FERRITIN -- 72  TIBC -- Not calculated due to Iron <10.  IRON -- <10*  RETICCTPCT -- --   HIV nonreactive  Urine strep pneumonia antigen negative  Micro Results: Recent Results (from the past 240 hour(s))  CULTURE, BLOOD (ROUTINE X 2)     Status: Normal   Collection Time   10/01/11  5:31 PM      Component Value Range Status Comment   Specimen Description BLOOD LEFT HAND DRAWN BY RN   Final    Special Requests     Final    Value: BOTTLES DRAWN AEROBIC AND ANAEROBIC AEB=8CC ANA=6CC  Setup Time [id number]   Final    Culture     Final    Value: GROUP B STREP(S.AGALACTIAE)ISOLATED     Note: Gram Stain Report Called to,Read Back By and Verified With: LATOYA FOOTE RN ON 10/02/11 @ 0730 BY RESSEGGER R Performed at [facility]   Report Status 10/04/2011 FINAL   Final    Organism ID, Bacteria GROUP B STREP(S.AGALACTIAE)ISOLATED   Final   CULTURE, BLOOD (ROUTINE X 2)     Status: Normal   Collection Time   10/01/11  5:36 PM      Component Value Range Status Comment   Specimen Description BLOOD RIGHT ANTECUBITAL   Final    Special Requests BOTTLES DRAWN AEROBIC AND ANAEROBIC [facility]   Final    Setup Time [id number]   Final    Culture     Final    Value: GROUP B STREP(S.AGALACTIAE)ISOLATED     Note:  SUSCEPTIBILITIES PERFORMED ON PREVIOUS CULTURE WITHIN THE LAST 5 DAYS.     Note: Gram Stain Report Called to,Read Back By and Verified With: Morene Antu RN @ [phone] ON 10/02/11 BY RESSEGGER R Performed at [facility]   Report Status 10/04/2011 FINAL   Final   CULTURE, BLOOD (ROUTINE X 2)     Status: Normal (Preliminary result)   Collection Time   10/03/11  6:50 AM      Component Value Range Status Comment   Specimen Description Peripheral RIGHT ARM   Final    Special Requests Normal 6CC   Final    Culture NO GROWTH 1 DAY   Final    Report Status PENDING   Incomplete   CULTURE, BLOOD (ROUTINE X 2)     Status: Normal (Preliminary result)   Collection Time   10/03/11  6:53 AM      Component Value Range Status Comment   Specimen Description Peripheral LEFT ARM   Final    Special Requests Normal 6CC   Final    Culture NO GROWTH 1 DAY   Final    Report Status PENDING   Incomplete    Studies/Results: Dg Chest 2 View  10/04/2011  *RADIOLOGY REPORT*  Clinical Data: Fever.  Follow up bibasilar atelectasis versus pneumonia.  CHEST - 2 VIEW 10/03/2011:  Comparison: Portable chest x-ray 10/01/2011 [facility].  Findings: Cardiomediastinal silhouette unremarkable and unchanged. Suboptimal inspiration.  Resolution of the patchy airspace opacities at the right lung base.  Persistent focal airspace consolidation in the medial left lower lobe.  Lungs otherwise clear.  Possible small bilateral pleural effusions.  Mild degenerative changes involving the thoracic spine.  IMPRESSION: Suboptimal inspiration.  Resolution of right basilar atelectasis versus pneumonia.  Persistent left lower lobe pneumonia.  No new abnormalities.  Original Report Authenticated By: Deniece Portela, M.D.   Scheduled Meds:    . cefTRIAXone (ROCEPHIN)  IV  1 g Intravenous Q24H  . glimepiride  4 mg Oral Q breakfast  . insulin aspart  0-20 Units Subcutaneous TID WC  . insulin aspart  0-5 Units Subcutaneous QHS  .  insulin glargine  10 Units Subcutaneous Daily  . levofloxacin (LEVAQUIN) IV  750 mg Intravenous Q24H  . metFORMIN  1,000 mg Oral BID WC  . potassium chloride  20 mEq Oral BID  . senna-docusate  1 tablet Oral BID  . sodium chloride      . vancomycin  1,000 mg Intravenous Q12H   Continuous Infusions:    . 0.9 % NaCl with KCl  20 mEq / L 60 mL/hr at 10/05/11 0957   PRN Meds:.acetaminophen Assessment/Plan:  1. Group B strep bacteremia, confirmed via of blood cultures. Sensitive to both vancomycin and Levaquin which she is on. Presumably this is from pneumonia. His chest x-ray revealed persistent left lower lobe pneumonia. HIV was nonreactive. The influenza PCR was negative. His initial urinalysis was essentially unremarkable. His urine strep pneumo antigen was also negative. He continues to run a fever intermittently. His white blood cell count did decrease from yesterday. We'll order another urinalysis to assess.   [name] IV site on his left arm looks erythematous. Will ask the registered nurse to discontinue the IV and place it in another arm or another site.    DM (diabetes mellitus), type 2, uncontrolled: Hemoglobin A1c was almost 14. Patient never checks his blood glucoses at home and was only on 500 mg of metformin twice a day. Patient has remained on resistant sliding scale NovoLog. Amaryl 4 mg and Lantus were added. I doubt patient would be compliant with insulin at home, as he does not even check his blood glucoses. The registered dietitian's assessment and recommendations noted and appreciated.   Anemia, iron deficiency: He is status post 2 units of packed red blood cell transfusions. His hemoglobin improved appropriately. Patient has no active bleeding currently, and his hemoglobin drop may be somewhat in part dilutional. Hemoccult of the stools are pending. However, he will need an elective colonoscopy and/or upper endoscopy once his fever stabilizes. Hold off on oral iron  supplementation for now, but ultimately he will need iron therapy and further workup. Will order a CT scan of his abdomen and pelvis to rule out a mass in light of unexplained anemia and ongoing low-grade fever.   Mild hyponatremia. On gentle IV fluids.  Question mild mental retardation. Will ask for a social worker/case management consultation to evaluate or look into living environment and possible lack of understanding regarding medical treatment.  We will assess his thyroid function with TSH and free T4.   LOS: 4 days   Eluterio Seymour 10/05/2011, 2:05 PM

## 2011-10-06 ENCOUNTER — Inpatient Hospital Stay (HOSPITAL_COMMUNITY): Payer: Medicare Other

## 2011-10-06 ENCOUNTER — Encounter (HOSPITAL_COMMUNITY): Payer: Self-pay | Admitting: Internal Medicine

## 2011-10-06 DIAGNOSIS — N390 Urinary tract infection, site not specified: Secondary | ICD-10-CM

## 2011-10-06 DIAGNOSIS — N3289 Other specified disorders of bladder: Secondary | ICD-10-CM

## 2011-10-06 DIAGNOSIS — R93429 Abnormal radiologic findings on diagnostic imaging of unspecified kidney: Secondary | ICD-10-CM

## 2011-10-06 DIAGNOSIS — R509 Fever, unspecified: Secondary | ICD-10-CM | POA: Diagnosis present

## 2011-10-06 HISTORY — DX: Urinary tract infection, site not specified: N39.0

## 2011-10-06 HISTORY — DX: Other specified disorders of bladder: N32.89

## 2011-10-06 HISTORY — DX: Abnormal radiologic findings on diagnostic imaging of unspecified kidney: R93.429

## 2011-10-06 LAB — CBC
HCT: 28.1 % — ABNORMAL LOW (ref 39.0–52.0)
Hemoglobin: 9.2 g/dL — ABNORMAL LOW (ref 13.0–17.0)
MCH: 27 pg (ref 26.0–34.0)
MCHC: 32.7 g/dL (ref 30.0–36.0)
MCV: 82.4 fL (ref 78.0–100.0)
Platelets: 253 10*3/uL (ref 150–400)
RBC: 3.41 MIL/uL — ABNORMAL LOW (ref 4.22–5.81)
RDW: 14.5 % (ref 11.5–15.5)
WBC: 9.8 10*3/uL (ref 4.0–10.5)

## 2011-10-06 LAB — GLUCOSE, CAPILLARY
Glucose-Capillary: 160 mg/dL — ABNORMAL HIGH (ref 70–99)
Glucose-Capillary: 166 mg/dL — ABNORMAL HIGH (ref 70–99)
Glucose-Capillary: 224 mg/dL — ABNORMAL HIGH (ref 70–99)
Glucose-Capillary: 213 mg/dL — ABNORMAL HIGH (ref 70–99)

## 2011-10-06 LAB — FOLATE RBC: RBC Folate: 779 ng/mL — ABNORMAL HIGH (ref >=366)

## 2011-10-06 LAB — BASIC METABOLIC PANEL
BUN: 8 mg/dL (ref 6–23)
CO2: 25 mEq/L (ref 19–32)
Calcium: 9 mg/dL (ref 8.4–10.5)
Chloride: 99 mEq/L (ref 96–112)
Creatinine, Ser: 1.08 mg/dL (ref 0.50–1.35)
GFR calc Af Amer: 82 mL/min — ABNORMAL LOW (ref >90)
GFR calc non Af Amer: 71 mL/min — ABNORMAL LOW (ref >90)
Glucose, Bld: 144 mg/dL — ABNORMAL HIGH (ref 70–99)
Potassium: 3.7 mEq/L (ref 3.5–5.1)
Sodium: 132 mEq/L — ABNORMAL LOW (ref 135–145)

## 2011-10-06 MED ORDER — PIPERACILLIN-TAZOBACTAM 3.375 G IVPB
3.3750 g | Freq: Three times a day (TID) | INTRAVENOUS | Status: DC
  Administered 2011-10-06 – 2011-10-09 (×10): 3.375 g via INTRAVENOUS
  Filled 2011-10-06 (×13): qty 50

## 2011-10-06 MED ORDER — INSULIN GLARGINE 100 UNIT/ML ~~LOC~~ SOLN
20.0000 [IU] | Freq: Every day | SUBCUTANEOUS | Status: DC
  Administered 2011-10-06 – 2011-10-09 (×4): 20 [IU] via SUBCUTANEOUS

## 2011-10-06 NOTE — Progress Notes (Signed)
Subjective: The patient continues to have no complaints. He denies left flank pain. He wants to know when he can go home.  Objective: Vital signs in last 24 hours: Filed Vitals:   10/05/11 2136 10/06/11 0208 10/06/11 0438 10/06/11 1014  BP: 135/86 144/79 118/70 124/76  Pulse: 97 97 96 91  Temp: 101.7 F (38.7 C) 100.4 F (38 C) 100.6 F (38.1 C) 99.3 F (37.4 C)  TempSrc: Oral Oral Oral Oral  Resp: 16 18 18 18   Height:      Weight:      SpO2: 99% 99% 100% 99%   Weight change:   Intake/Output Summary (Last 24 hours) at 10/06/11 1022 Last data filed at 10/06/11 0442  Gross per 24 hour  Intake    900 ml  Output      0 ml  Net    900 ml   Physical Exam: General: No acute distress. Lungs clear to auscultation bilaterally, except decreased breath sounds in the bases. cardiovascular regular rate rhythm without murmurs gallops rubs Abdomen soft nontender nondistended, no left flank tenderness. Extremities: His right leg is slightly larger than the left leg but no significant edema. Skin: Left arm IV removed. Mild residual erythema. Nontender.  Lab Results: Basic Metabolic Panel:  Lab 0000000 0453 10/05/11 0622  NA 132* 131*  K 3.7 4.1  CL 99 98  CO2 25 24  GLUCOSE 144* 216*  BUN 8 11  CREATININE 1.08 1.07  CALCIUM 9.0 8.8  MG -- --  PHOS -- --   Liver Function Tests:  Lab 10/01/11 1736  AST 14  ALT 14  ALKPHOS 79  BILITOT 0.5  PROT 7.4  ALBUMIN 2.8*   No results found for this basename: LIPASE:2,AMYLASE:2 in the last 168 hours No results found for this basename: AMMONIA:2 in the last 168 hours CBC:  Lab 10/06/11 0453 10/05/11 0622 10/03/11 0545 10/01/11 1736  WBC 9.8 11.6* -- --  NEUTROABS -- -- 11.5* 12.3*  HGB 9.2* 8.9* -- --  HCT 28.1* 27.2* -- --  MCV 82.4 82.2 -- --  PLT 253 202 -- --   Cardiac Enzymes: No results found for this basename: CKTOTAL:3,CKMB:3,CKMBINDEX:3,TROPONINI:3 in the last 168 hours BNP: No results found for this basename:  POCBNP:3 in the last 168 hours D-Dimer: No results found for this basename: DDIMER:2 in the last 168 hours CBG:  Lab 10/06/11 0736 10/05/11 2132 10/05/11 1818 10/05/11 1131 10/05/11 0752 10/04/11 2044  GLUCAP 160* 92 121* 201* 209* 185*   Hemoglobin A1C:  Lab 10/01/11 1727  HGBA1C 13.9*   Fasting Lipid Panel: No results found for this basename: CHOL,HDL,LDLCALC,TRIG,CHOLHDL,LDLDIRECT in the last 168 hours Thyroid Function Tests:  Lab 10/05/11 0622  TSH 1.700  T4TOTAL --  FREET4 0.99  T3FREE --  THYROIDAB --   Coagulation: No results found for this basename: LABPROT:4,INR:4 in the last 168 hours Anemia Panel:  Lab 10/03/11 0545 10/01/11 1727  VITAMINB12 414 --  FOLATE -- --  FERRITIN -- 72  TIBC -- Not calculated due to Iron <10.  IRON -- <10*  RETICCTPCT -- --   HIV nonreactive  Urine strep pneumonia antigen negative  Micro Results: Recent Results (from the past 240 hour(s))  CULTURE, BLOOD (ROUTINE X 2)     Status: Normal   Collection Time   10/01/11  5:31 PM      Component Value Range Status Comment   Specimen Description BLOOD LEFT HAND DRAWN BY RN   Final    Special Requests  Final    Value: BOTTLES DRAWN AEROBIC AND ANAEROBIC AEB=8CC ANA=6CC   Setup Time [id number]   Final    Culture     Final    Value: GROUP B STREP(S.AGALACTIAE)ISOLATED     Note: Gram Stain Report Called to,Read Back By and Verified With: LATOYA FOOTE RN ON 10/02/11 @ 0730 BY RESSEGGER R Performed at [facility]   Report Status 10/04/2011 FINAL   Final    Organism ID, Bacteria GROUP B STREP(S.AGALACTIAE)ISOLATED   Final   CULTURE, BLOOD (ROUTINE X 2)     Status: Normal   Collection Time   10/01/11  5:36 PM      Component Value Range Status Comment   Specimen Description BLOOD RIGHT ANTECUBITAL   Final    Special Requests BOTTLES DRAWN AEROBIC AND ANAEROBIC [facility]   Final    Setup Time [id number]   Final    Culture     Final    Value: GROUP B STREP(S.AGALACTIAE)ISOLATED      Note: SUSCEPTIBILITIES PERFORMED ON PREVIOUS CULTURE WITHIN THE LAST 5 DAYS.     Note: Gram Stain Report Called to,Read Back By and Verified With: Morene Antu RN @ [phone] ON 10/02/11 BY RESSEGGER R Performed at [facility]   Report Status 10/04/2011 FINAL   Final   CULTURE, BLOOD (ROUTINE X 2)     Status: Normal (Preliminary result)   Collection Time   10/03/11  6:50 AM      Component Value Range Status Comment   Specimen Description Peripheral RIGHT ARM   Final    Special Requests Normal 6CC   Final    Culture NO GROWTH 1 DAY   Final    Report Status PENDING   Incomplete   CULTURE, BLOOD (ROUTINE X 2)     Status: Normal (Preliminary result)   Collection Time   10/03/11  6:53 AM      Component Value Range Status Comment   Specimen Description Peripheral LEFT ARM   Final    Special Requests Normal 6CC   Final    Culture NO GROWTH 1 DAY   Final    Report Status PENDING   Incomplete    Studies/Results: Ct Abdomen Pelvis W Contrast  10/05/2011  *RADIOLOGY REPORT*  Clinical Data: Fever and anemia.  CT ABDOMEN AND PELVIS WITH CONTRAST  Technique:  Multidetector CT imaging of the abdomen and pelvis was performed following the standard protocol during bolus administration of intravenous contrast.  Contrast: 15mL OMNIPAQUE IOHEXOL 300 MG/ML IV SOLN  Comparison: Chest x-ray from 10/03/2011  Findings: There is diffuse fatty infiltration of the liver.  7 mm low density lesion in the lateral segment left liver is too small to characterize but is statistically benign, probably a cyst.  The spleen is normal.  The stomach shows a small hiatal hernia is otherwise normal.  1.4 cm soft tissue structure is seen just lateral to the first portion of the duodenum.  This may be a duodenal diverticulum.  I think less likely this represents an isolated lymph node or some extension of pancreatic parenchyma.  Gallbladder is decompressed. The adrenal glands are normal.  Right kidney is unremarkable.  There is edema  and fluid around the left kidney and there is asymmetrically decreased perfusion to the left kidney.  Mild fullness of the left intrarenal collecting system is evident and there is mild dilatation of the left ureter following the UVJ.  No abdominal aortic aneurysm.  No evidence for free fluid or  retroperitoneal lymphadenopathy in the abdomen.  Imaging through the pelvis shows no free fluid.  No pelvic sidewall lymphadenopathy.  The prostate gland is prominently enlarged with mass effect on the bladder base.  There is circumferential bladder wall thickening.  No colonic diverticulitis.  The terminal ileum is normal. The appendix is not visualized, but there is no edema or inflammation in the region of the cecum.  Bone windows reveal no worrisome lytic or sclerotic osseous lesions.  IMPRESSION: Edema and decreased perfusion of the left kidney with mild fullness of the left intrarenal collecting system and mild left hydroureter. A definite etiology for these changes is not evident.  Decreased perfusion can be seen in an obstructive uropathy, and no lesion in the left ureteral vesicle junction could produce these features. The patient has associated circumferential bladder wall thickening which may be secondary to a component of bladder outlet obstruction given the prostatomegaly, but bladder wall thickening could also be secondary to infection. The edema in the left kidney could be secondary to infection.  Recent kidney stone passage or uroepithelial neoplasm would be less likely considerations for this appearance.  Original Report Authenticated By: ERIC A. MANSELL, M.D.   Scheduled Meds:    . glimepiride  4 mg Oral Q breakfast  . insulin aspart  0-20 Units Subcutaneous TID WC  . insulin aspart  0-5 Units Subcutaneous QHS  . insulin glargine  20 Units Subcutaneous QHS  . levofloxacin (LEVAQUIN) IV  750 mg Intravenous Q24H  . piperacillin-tazobactam (ZOSYN)  IV  3.375 g Intravenous Q8H  . polyethylene glycol   17 g Oral Daily  . senna-docusate  1 tablet Oral BID  . sodium chloride      . vancomycin  1,000 mg Intravenous Q12H  . DISCONTD: cefTRIAXone (ROCEPHIN)  IV  1 g Intravenous Q24H  . DISCONTD: insulin glargine  10 Units Subcutaneous Daily  . DISCONTD: metFORMIN  1,000 mg Oral BID WC   Continuous Infusions:    . 0.9 % NaCl with KCl 20 mEq / L 1,000 mL (10/06/11 0641)   PRN Meds:.acetaminophen, iohexol Assessment/Plan:  1. Group B strep bacteremia, confirmed via of blood cultures. Sensitive to both vancomycin and Levaquin which she is on. Presumably this is from pneumonia, but now given the CT of the abdomen results and the followup urinalysis, it could be secondary to left pyelonephritis. We'll therefore discontinue Rocephin and add Zosyn for persistent fever. HIV was nonreactive. The influenza PCR was negative. His initial urinalysis was essentially unremarkable. His urine strep pneumo antigen was also negative.   Persistent fever. He is currently on vancomycin, Rocephin, and Levaquin. These medications historically should cover pneumonia and urinary tract infection bacteria. As above, we'll discontinue Rocephin in favor of Zosyn and continue vancomycin and Levaquin. We will order another set of blood cultures. We'll culture the urine. We'll order an ultrasound of the kidneys for further evaluation. Abscess is a concern given the persistent fever. A urology consultation may be needed. But will await the results of the renal ultrasound first. We'll also obtain an venous ultrasound of both legs to rule out DVT.  Status post removal of the left IV do to erythema.    DM (diabetes mellitus), type 2, uncontrolled: Hemoglobin A1c was almost 14. Patient never checks his blood glucoses at home and was only on 500 mg of metformin twice a day. Patient has remained on resistant sliding scale NovoLog. Amaryl 4 mg and Lantus were added. I doubt patient would be compliant with insulin  at home, as he does not  even check his blood glucoses. The registered dietitian's assessment and recommendations noted and appreciated.  Because of the IV contrast, will discontinue metformin for at least 48 hours and increase Lantus for glycemic control.   Anemia, iron deficiency: He is status post 2 units of packed red blood cell transfusions. His hemoglobin improved appropriately. Patient has no active bleeding currently, and his hemoglobin drop may be somewhat in part dilutional. Hemoccult of the stools are pending. However, he will need an elective colonoscopy and/or upper endoscopy once his fever stabilizes. Hold off on oral iron supplementation for now, but ultimately he will need iron therapy and further workup. The CT of his abdomen and pelvis revealed no obvious: Or small intestinal masses. There was a 1.4 cm soft tissue structure noted just lateral to the first portion of the duodenum. This may be a diverticulum but it is not certain. As above, the patient will ultimately need an upper endoscopy and colonoscopy.   Mild hyponatremia. On gentle IV fluids.  Question mild mental retardation. Will ask for a social worker/case management consultation to evaluate or look into living environment and possible lack of understanding regarding medical treatment.  His thyroid function is within normal limits.   LOS: 5 days   Zachary [name] 10/06/2011, 10:22 AM

## 2011-10-06 NOTE — Progress Notes (Addendum)
ANTIBIOTIC CONSULT NOTE   Pharmacy Consult for Vancomycin & Zosyn Indication: pneumonia & Positive Culture per MD request.  No Known Allergies  Patient Measurements: Height: 5\' 8"  (172.7 cm) Weight: 189 lb 9.5 oz (86 kg) IBW/kg (Calculated) : 68.4   Vital Signs: Temp: 100.6 F (38.1 C) (12/10 0438) Temp src: Oral (12/10 0438) BP: 118/70 mmHg (12/10 0438) Pulse Rate: 96  (12/10 0438) Intake/Output from previous day: 12/09 0701 - 12/10 0700 In: 900 [P.O.:420; I.V.:480] Out: -  Intake/Output from this shift:    Labs:  Basename 10/06/11 0453 10/05/11 0622 10/04/11 0659  WBC 9.8 11.6* 16.8*  HGB 9.2* 8.9* 9.4*  PLT 253 202 192  LABCREA -- -- --  CREATININE 1.08 1.07 0.99   Estimated Creatinine Clearance: 74.7 ml/min (by C-G formula based on Cr of 1.08).  Basename 10/04/11 0659  VANCOTROUGH 14.6  VANCOPEAK --  Jake Michaelis --  GENTTROUGH --  GENTPEAK --  GENTRANDOM --  TOBRATROUGH --  TOBRAPEAK --  TOBRARND --  AMIKACINPEAK --  AMIKACINTROU --  AMIKACIN --    Microbiology: Recent Results (from the past 720 hour(s))  CULTURE, BLOOD (ROUTINE X 2)     Status: Normal   Collection Time   10/01/11  5:31 PM      Component Value Range Status Comment   Specimen Description BLOOD LEFT HAND DRAWN BY RN   Final    Special Requests     Final    Value: BOTTLES DRAWN AEROBIC AND ANAEROBIC AEB=8CC ANA=6CC   Setup Time [id number]   Final    Culture     Final    Value: GROUP B STREP(S.AGALACTIAE)ISOLATED     Note: Gram Stain Report Called to,Read Back By and Verified With: Morene Antu RN ON 10/02/11 @ 0730 BY RESSEGGER R Performed at [facility]   Report Status 10/04/2011 FINAL   Final    Organism ID, Bacteria GROUP B STREP(S.AGALACTIAE)ISOLATED   Final   CULTURE, BLOOD (ROUTINE X 2)     Status: Normal   Collection Time   10/01/11  5:36 PM      Component Value Range Status Comment   Specimen Description BLOOD RIGHT ANTECUBITAL   Final    Special Requests BOTTLES  DRAWN AEROBIC AND ANAEROBIC [facility]   Final    Setup Time [id number]   Final    Culture     Final    Value: GROUP B STREP(S.AGALACTIAE)ISOLATED     Note: SUSCEPTIBILITIES PERFORMED ON PREVIOUS CULTURE WITHIN THE LAST 5 DAYS.     Note: Gram Stain Report Called to,Read Back By and Verified With: Morene Antu RN @ [phone] ON 10/02/11 BY RESSEGGER R Performed at [facility]   Report Status 10/04/2011 FINAL   Final   CULTURE, BLOOD (ROUTINE X 2)     Status: Normal (Preliminary result)   Collection Time   10/03/11  6:50 AM      Component Value Range Status Comment   Specimen Description Peripheral RIGHT ARM   Final    Special Requests Normal 6CC   Final    Culture NO GROWTH 1 DAY   Final    Report Status PENDING   Incomplete   CULTURE, BLOOD (ROUTINE X 2)     Status: Normal (Preliminary result)   Collection Time   10/03/11  6:53 AM      Component Value Range Status Comment   Specimen Description Peripheral LEFT ARM   Final    Special Requests [facility]   Final  Culture NO GROWTH 1 DAY   Final    Report Status PENDING   Incomplete     Medical History: Past Medical History  Diagnosis Date  . Hypertension   . Diabetes mellitus   . Smoking     Medications:  Anti-infectives     Start     Dose/Rate Route Frequency Ordered Stop   10/04/11 1300   Levofloxacin (LEVAQUIN) IVPB 750 mg        750 mg 100 mL/hr over 90 Minutes Intravenous Every 24 hours 10/04/11 1200     10/03/11 1000   azithromycin (ZITHROMAX) tablet 250 mg  Status:  Discontinued        250 mg Oral Daily 10/03/11 0932 10/04/11 1200   10/02/11 2000   vancomycin (VANCOCIN) IVPB 1000 mg/200 mL premix        1,000 mg 200 mL/hr over 60 Minutes Intravenous Every 12 hours 10/02/11 0714     10/02/11 0700   vancomycin (VANCOCIN) IVPB 1000 mg/200 mL premix        1,000 mg 200 mL/hr over 60 Minutes Intravenous  Once 10/02/11 0658 10/02/11 0950   10/01/11 2345   cefTRIAXone (ROCEPHIN) 1 g in dextrose 5 % 50 mL IVPB  Status:   Discontinued        1 g 100 mL/hr over 30 Minutes Intravenous Every 24 hours 10/01/11 2335 10/06/11 0837   10/01/11 2345   azithromycin (ZITHROMAX) 500 mg in dextrose 5 % 250 mL IVPB  Status:  Discontinued        500 mg 250 mL/hr over 60 Minutes Intravenous Every 24 hours 10/01/11 2335 10/03/11 0932   10/01/11 2138   dextrose 5 % with cefTRIAXone (ROCEPHIN) ADS Med     Comments: Poindexter, MELINDA: cabinet override         10/01/11 2138 10/01/11 2238   10/01/11 2030   azithromycin (ZITHROMAX) 500 mg in dextrose 5 % 250 mL IVPB        500 mg 250 mL/hr over 60 Minutes Intravenous  Once 10/01/11 2021 10/01/11 2335   10/01/11 2030   cefTRIAXone (ROCEPHIN) 1 g in dextrose 5 % 50 mL IVPB        1 g 120 mL/hr over 30 Minutes Intravenous  Once 10/01/11 2021 10/01/11 2238         Assessment: Trough level = 14.6 and anticipate some accumulation therefore level is acceptable Good renal fxn  Goal of Therapy:  Eradicate infection. Vancomycin Trough 15-20.  Plan:  Continue Vancomycin 1000mg  IV every 12 hours. Labs per protocol Addendum: Zosyn added 12/10:  3.375gm iv q8hrs  Hart Robinsons A 10/06/2011,9:11 AM

## 2011-10-07 ENCOUNTER — Encounter (HOSPITAL_COMMUNITY): Payer: Self-pay | Admitting: Internal Medicine

## 2011-10-07 DIAGNOSIS — N151 Renal and perinephric abscess: Secondary | ICD-10-CM

## 2011-10-07 DIAGNOSIS — N10 Acute tubulo-interstitial nephritis: Secondary | ICD-10-CM | POA: Diagnosis present

## 2011-10-07 HISTORY — DX: Renal and perinephric abscess: N15.1

## 2011-10-07 LAB — GLUCOSE, CAPILLARY
Glucose-Capillary: 122 mg/dL — ABNORMAL HIGH (ref 70–99)
Glucose-Capillary: 170 mg/dL — ABNORMAL HIGH (ref 70–99)
Glucose-Capillary: 204 mg/dL — ABNORMAL HIGH (ref 70–99)
Glucose-Capillary: 185 mg/dL — ABNORMAL HIGH (ref 70–99)

## 2011-10-07 LAB — APTT: aPTT: 30 seconds (ref 24–37)

## 2011-10-07 LAB — PROTIME-INR
INR: 1.18 (ref 0.00–1.49)
Prothrombin Time: 15.3 seconds — ABNORMAL HIGH (ref 11.6–15.2)

## 2011-10-07 LAB — URINE CULTURE
Colony Count: NO GROWTH
Culture  Setup Time: 201212092014
Culture: NO GROWTH

## 2011-10-07 NOTE — Consult Note (Signed)
[phone]

## 2011-10-07 NOTE — Progress Notes (Signed)
Subjective: The patient has no complaints, specifically, he has no complaints of left flank pain. He has no complaints of pain with urination.  Objective: Vital signs in last 24 hours: Filed Vitals:   10/06/11 1700 10/06/11 2200 10/07/11 0534 10/07/11 1002  BP: 122/71 125/77 114/65 120/58  Pulse: 94 90 111 88  Temp: 98.9 F (37.2 C) 98.5 F (36.9 C) 98.7 F (37.1 C) 97.6 F (36.4 C)  TempSrc: Oral Oral Oral Oral  Resp: 18 18 20 22   Height:      Weight:      SpO2: 98% 97% 96% 96%   Weight change:   Intake/Output Summary (Last 24 hours) at 10/07/11 1121 Last data filed at 10/07/11 0900  Gross per 24 hour  Intake    900 ml  Output      0 ml  Net    900 ml   Physical Exam: General: No acute distress. Lungs clear to auscultation bilaterally, except decreased breath sounds in the bases. cardiovascular regular rate rhythm without murmurs gallops rubs Abdomen soft nontender nondistended, no left flank tenderness. Extremities: His right leg is slightly larger than the left leg but no significant edema. Skin: Left arm IV removed. Mild residual erythema. Nontender.  Lab Results: Basic Metabolic Panel:  Lab 0000000 0453 10/05/11 0622  NA 132* 131*  K 3.7 4.1  CL 99 98  CO2 25 24  GLUCOSE 144* 216*  BUN 8 11  CREATININE 1.08 1.07  CALCIUM 9.0 8.8  MG -- --  PHOS -- --   Liver Function Tests:  Lab 10/01/11 1736  AST 14  ALT 14  ALKPHOS 79  BILITOT 0.5  PROT 7.4  ALBUMIN 2.8*   No results found for this basename: LIPASE:2,AMYLASE:2 in the last 168 hours No results found for this basename: AMMONIA:2 in the last 168 hours CBC:  Lab 10/06/11 0453 10/05/11 0622 10/03/11 0545 10/01/11 1736  WBC 9.8 11.6* -- --  NEUTROABS -- -- 11.5* 12.3*  HGB 9.2* 8.9* -- --  HCT 28.1* 27.2* -- --  MCV 82.4 82.2 -- --  PLT 253 202 -- --   Cardiac Enzymes: No results found for this basename: CKTOTAL:3,CKMB:3,CKMBINDEX:3,TROPONINI:3 in the last 168 hours BNP: No components  found with this basename: POCBNP:3 D-Dimer: No results found for this basename: DDIMER:2 in the last 168 hours CBG:  Lab 10/07/11 0725 10/06/11 2106 10/06/11 1647 10/06/11 1059 10/06/11 0736 10/05/11 2132  GLUCAP 122* 213* 224* 166* 160* 92   Hemoglobin A1C:  Lab 10/01/11 1727  HGBA1C 13.9*   Fasting Lipid Panel: No results found for this basename: CHOL,HDL,LDLCALC,TRIG,CHOLHDL,LDLDIRECT in the last 168 hours Thyroid Function Tests:  Lab 10/05/11 0622  TSH 1.700  T4TOTAL --  FREET4 0.99  T3FREE --  THYROIDAB --   Coagulation: No results found for this basename: LABPROT:4,INR:4 in the last 168 hours Anemia Panel:  Lab 10/03/11 0545 10/01/11 1727  VITAMINB12 414 --  FOLATE -- --  FERRITIN -- 72  TIBC -- Not calculated due to Iron <10.  IRON -- <10*  RETICCTPCT -- --   HIV nonreactive  Urine strep pneumonia antigen negative  Micro Results: Recent Results (from the past 240 hour(s))  CULTURE, BLOOD (ROUTINE X 2)     Status: Normal   Collection Time   10/01/11  5:31 PM      Component Value Range Status Comment   Specimen Description BLOOD LEFT HAND DRAWN BY RN   Final    Special Requests  Final    Value: BOTTLES DRAWN AEROBIC AND ANAEROBIC AEB=8CC ANA=6CC   Setup Time [id number]   Final    Culture     Final    Value: GROUP B STREP(S.AGALACTIAE)ISOLATED     Note: Gram Stain Report Called to,Read Back By and Verified With: LATOYA FOOTE RN ON 10/02/11 @ 0730 BY RESSEGGER R Performed at [facility]   Report Status 10/04/2011 FINAL   Final    Organism ID, Bacteria GROUP B STREP(S.AGALACTIAE)ISOLATED   Final   CULTURE, BLOOD (ROUTINE X 2)     Status: Normal   Collection Time   10/01/11  5:36 PM      Component Value Range Status Comment   Specimen Description BLOOD RIGHT ANTECUBITAL   Final    Special Requests BOTTLES DRAWN AEROBIC AND ANAEROBIC [facility]   Final    Setup Time [id number]   Final    Culture     Final    Value: GROUP B  STREP(S.AGALACTIAE)ISOLATED     Note: SUSCEPTIBILITIES PERFORMED ON PREVIOUS CULTURE WITHIN THE LAST 5 DAYS.     Note: Gram Stain Report Called to,Read Back By and Verified With: Morene Antu RN @ [phone] ON 10/02/11 BY RESSEGGER R Performed at [facility]   Report Status 10/04/2011 FINAL   Final   CULTURE, BLOOD (ROUTINE X 2)     Status: Normal (Preliminary result)   Collection Time   10/03/11  6:50 AM      Component Value Range Status Comment   Specimen Description BLOOD RIGHT ARM   Final    Special Requests BOTTLES DRAWN AEROBIC AND ANAEROBIC 6CC   Final    Culture NO GROWTH 3 DAYS   Final    Report Status PENDING   Incomplete   CULTURE, BLOOD (ROUTINE X 2)     Status: Normal (Preliminary result)   Collection Time   10/03/11  6:53 AM      Component Value Range Status Comment   Specimen Description BLOOD RIGHT ARM   Final    Special Requests BOTTLES DRAWN AEROBIC AND ANAEROBIC [place]   Final    Culture NO GROWTH 3 DAYS   Final    Report Status PENDING   Incomplete   URINE CULTURE     Status: Normal   Collection Time   10/05/11  3:46 PM      Component Value Range Status Comment   Specimen Description URINE, CLEAN CATCH   Final    Special Requests NONE   Final    Setup Time [id number]   Final    Colony Count NO GROWTH   Final    Culture NO GROWTH   Final    Report Status 10/07/2011 FINAL   Final   CULTURE, BLOOD (ROUTINE X 2)     Status: Normal (Preliminary result)   Collection Time   10/06/11  9:26 AM      Component Value Range Status Comment   Specimen Description Blood   Final    Special Requests Normal   Final    Culture NO GROWTH <24 HRS   Final    Report Status PENDING   Incomplete   CULTURE, BLOOD (ROUTINE X 2)     Status: Normal (Preliminary result)   Collection Time   10/06/11  9:26 AM      Component Value Range Status Comment   Specimen Description Blood   Final    Special Requests Normal   Final    Culture NO GROWTH <24  HRS   Final    Report Status PENDING    Incomplete    Studies/Results: Ct Abdomen Pelvis W Contrast  10/05/2011  *RADIOLOGY REPORT*  Clinical Data: Fever and anemia.  CT ABDOMEN AND PELVIS WITH CONTRAST  Technique:  Multidetector CT imaging of the abdomen and pelvis was performed following the standard protocol during bolus administration of intravenous contrast.  Contrast: 169mL OMNIPAQUE IOHEXOL 300 MG/ML IV SOLN  Comparison: Chest x-ray from 10/03/2011  Findings: There is diffuse fatty infiltration of the liver.  7 mm low density lesion in the lateral segment left liver is too small to characterize but is statistically benign, probably a cyst.  The spleen is normal.  The stomach shows a small hiatal hernia is otherwise normal.  1.4 cm soft tissue structure is seen just lateral to the first portion of the duodenum.  This may be a duodenal diverticulum.  I think less likely this represents an isolated lymph node or some extension of pancreatic parenchyma.  Gallbladder is decompressed. The adrenal glands are normal.  Right kidney is unremarkable.  There is edema and fluid around the left kidney and there is asymmetrically decreased perfusion to the left kidney.  Mild fullness of the left intrarenal collecting system is evident and there is mild dilatation of the left ureter following the UVJ.  No abdominal aortic aneurysm.  No evidence for free fluid or retroperitoneal lymphadenopathy in the abdomen.  Imaging through the pelvis shows no free fluid.  No pelvic sidewall lymphadenopathy.  The prostate gland is prominently enlarged with mass effect on the bladder base.  There is circumferential bladder wall thickening.  No colonic diverticulitis.  The terminal ileum is normal. The appendix is not visualized, but there is no edema or inflammation in the region of the cecum.  Bone windows reveal no worrisome lytic or sclerotic osseous lesions.  IMPRESSION: Edema and decreased perfusion of the left kidney with mild fullness of the left intrarenal collecting  system and mild left hydroureter. A definite etiology for these changes is not evident.  Decreased perfusion can be seen in an obstructive uropathy, and no lesion in the left ureteral vesicle junction could produce these features. The patient has associated circumferential bladder wall thickening which may be secondary to a component of bladder outlet obstruction given the prostatomegaly, but bladder wall thickening could also be secondary to infection. The edema in the left kidney could be secondary to infection.  Recent kidney stone passage or uroepithelial neoplasm would be less likely considerations for this appearance.  Original Report Authenticated By: ERIC A. MANSELL, M.D.   US Renal  10/06/2011  *RADIOLOGY REPORT*  Clinical Data: Poor and anemia.  Abnormal appearance of the left kidney on CT scan dated 10/05/2011  RENAL/URINARY TRACT ULTRASOUND COMPLETE  Comparison:  CT scan dated 10/05/2011  Findings:  Right Kidney:  11.9 cm in length. Slight prominence of the renal pelvis, unchanged since the prior CT scan.  Left Kidney:  The upper pole of the left kidney is abnormal with a hypoechoic fissure in the left upper pole extending through the cortex into the perinephric space with fluid and what appears to be echogenic debris within this perinephric fluid collection.  The finding is consistent with pyelonephritis with a small perinephric abscess.   There is a simple appearing 2.1 cm cyst in the midportion of the left kidney. Slight dilatation of the left renal pelvis.  Bladder:  The bladder wall is markedly thickened.  There is enlargement of the prostate gland.  IMPRESSION:  1.  Pyelonephritis of the upper pole of the left kidney with a fissure through the upper pole to the perinephric space which I feel was caused by the infection with pus and debris in the perinephric space superiorly. 2.  Slight prominence of the renal collecting systems bilaterally, left greater than right, unchanged since 10/05/2011.  3. Thickening of the bladder wall which could be due to bladder outlet obstruction due to prostatomegaly but I am concerned the patient probably also has cystitis.  Original Report Authenticated By: Larey Seat, M.D.   US Venous Img Lower Bilateral  10/06/2011  *RADIOLOGY REPORT*  Clinical Data: Bilateral lower extremity edema, history diabetes  VENOUS DUPLEX ULTRASOUND OF BILATERAL LOWER EXTREMITIES  Technique:  Gray-scale sonography with graded compression, as well as color Doppler and duplex ultrasound, were performed to evaluate the deep venous system of both lower extremities from the level of the common femoral vein through the popliteal and proximal calf veins.  Spectral Doppler was utilized to evaluate flow at rest and with distal augmentation maneuvers.  Comparison:  None  Findings: Deep venous systems appear patent and compressible from groin to popliteal fossa bilaterally.  Spontaneous venous flow present with intact augmentation and evidence of respiratory phasicity.  No intraluminal thrombus identified.  Flow within the right popliteal vein is slightly sluggish.  Visualized portions of the greater saphenous systems appear patent.  IMPRESSION: No evidence of deep venous thrombosis in the lower extremities.  Original Report Authenticated By: Burnetta Sabin, M.D.   Scheduled Meds:    . glimepiride  4 mg Oral Q breakfast  . insulin aspart  0-20 Units Subcutaneous TID WC  . insulin aspart  0-5 Units Subcutaneous QHS  . insulin glargine  20 Units Subcutaneous QHS  . levofloxacin (LEVAQUIN) IV  750 mg Intravenous Q24H  . piperacillin-tazobactam (ZOSYN)  IV  3.375 g Intravenous Q8H  . polyethylene glycol  17 g Oral Daily  . senna-docusate  1 tablet Oral BID  . DISCONTD: vancomycin  1,000 mg Intravenous Q12H   Continuous Infusions:    . 0.9 % NaCl with KCl 20 mEq / L 1,000 mL (10/06/11 0641)   PRN Meds:.acetaminophen Assessment/Plan:  Acute left pyelonephritis and small perinephric  abscess. The CT scan of his abdomen and ultrasound of his kidneys were reviewed with the radiologist Dr. Thornton Papas. The small perinephric abscess may be amenable to drainage, however, Dr. Thornton Papas does not believe that it would improve the patient clinically, unless he continues to spike fevers on current antibiotics. Therefore, instead of potential risks from drainage percutaneously, we will continue to treat him with broad-spectrum antibiotics now that he is defervesced for the first time during the entire hospitalization. I have asked for a consult with Dr. Michela Pitcher for his input. Of note, a followup urine culture is unremarkable so far. He is now on day #2 of Zosyn. We'll continue Levaquin and discontinue vancomycin.   Group B strep bacteremia, confirmed via of blood cultures. Likely from acute pyelonephritis. Interestingly, his urine cultures have been unremarkable.  Persistent fever. It is likely that his persistent fever was from the newly diagnosed left pyelonephritis and perinephric abscess. He is now afebrile for the first time during the hospitalization. The bilateral lower extremity venous ultrasound was negative for DVT.  Status post removal of the left IV do to erythema.    DM (diabetes mellitus), type 2, uncontrolled: Hemoglobin A1c was almost 14. Patient never checks his blood glucoses at home and was only on  500 mg of metformin twice a day. Patient has remained on resistant sliding scale NovoLog. Amaryl 4 mg and Lantus were added. I doubt patient would be compliant with insulin at home, as he does not even check his blood glucoses. The registered dietitian's assessment and recommendations noted and appreciated.  Because of the IV contrast, will discontinue metformin for at least 48 hours and increase Lantus for glycemic control.   Anemia, iron deficiency: He is status post 2 units of packed red blood cell transfusions. His hemoglobin improved appropriately. Patient has no active bleeding  currently, and his hemoglobin drop may be somewhat in part dilutional. Hemoccult of the stools are pending. However, he will need an elective colonoscopy and/or upper endoscopy once his fever stabilizes. Hold off on oral iron supplementation for now, but ultimately he will need iron therapy and further workup. The CT of his abdomen and pelvis revealed no obvious: Or small intestinal masses. There was a 1.4 cm soft tissue structure noted just lateral to the first portion of the duodenum. This may be a diverticulum but it is not certain. As above, the patient will ultimately need an upper endoscopy and colonoscopy.   Mild hyponatremia. On gentle IV fluids.  Question mild mental retardation. Will ask for a social worker/case management consultation to evaluate or look into living environment and possible lack of understanding regarding medical treatment.  His thyroid function is within normal limits.   LOS: 6 days   Falesha Schommer 10/07/2011, 11:21 AM

## 2011-10-08 LAB — BASIC METABOLIC PANEL
BUN: 5 mg/dL — ABNORMAL LOW (ref 6–23)
CO2: 26 mEq/L (ref 19–32)
Calcium: 9.2 mg/dL (ref 8.4–10.5)
Chloride: 103 mEq/L (ref 96–112)
Creatinine, Ser: 1.17 mg/dL (ref 0.50–1.35)
GFR calc Af Amer: 75 mL/min — ABNORMAL LOW (ref >90)
GFR calc non Af Amer: 65 mL/min — ABNORMAL LOW (ref >90)
Glucose, Bld: 95 mg/dL (ref 70–99)
Potassium: 3.6 mEq/L (ref 3.5–5.1)
Sodium: 137 mEq/L (ref 135–145)

## 2011-10-08 LAB — CULTURE, BLOOD (ROUTINE X 2)
Culture: NO GROWTH
Culture: NO GROWTH

## 2011-10-08 LAB — CBC
HCT: 27.7 % — ABNORMAL LOW (ref 39.0–52.0)
Hemoglobin: 8.9 g/dL — ABNORMAL LOW (ref 13.0–17.0)
MCH: 26.6 pg (ref 26.0–34.0)
MCHC: 32.1 g/dL (ref 30.0–36.0)
MCV: 82.9 fL (ref 78.0–100.0)
Platelets: 337 10*3/uL (ref 150–400)
RBC: 3.34 MIL/uL — ABNORMAL LOW (ref 4.22–5.81)
RDW: 14.5 % (ref 11.5–15.5)
WBC: 5.8 10*3/uL (ref 4.0–10.5)

## 2011-10-08 LAB — GLUCOSE, CAPILLARY
Glucose-Capillary: 83 mg/dL (ref 70–99)
Glucose-Capillary: 220 mg/dL — ABNORMAL HIGH (ref 70–99)
Glucose-Capillary: 113 mg/dL — ABNORMAL HIGH (ref 70–99)
Glucose-Capillary: 222 mg/dL — ABNORMAL HIGH (ref 70–99)

## 2011-10-08 MED ORDER — INSULIN ASPART 100 UNIT/ML ~~LOC~~ SOLN
0.0000 [IU] | Freq: Three times a day (TID) | SUBCUTANEOUS | Status: DC
  Administered 2011-10-09 (×2): 3 [IU] via SUBCUTANEOUS
  Administered 2011-10-10: 1 [IU] via SUBCUTANEOUS

## 2011-10-08 MED ORDER — INSULIN ASPART 100 UNIT/ML ~~LOC~~ SOLN: 0.0000 [IU] | Freq: Three times a day (TID) | SUBCUTANEOUS | Status: DC

## 2011-10-08 NOTE — Consult Note (Signed)
Zachary [name], Zachary [name] NO.:  [id number]  MEDICAL RECORD NO.:  [id number]  LOCATION:                                 FACILITY:  PHYSICIAN:  Marissa Nestle, M.D.DATE OF BIRTH:  1946/04/07  DATE OF CONSULTATION: DATE OF DISCHARGE:                                CONSULTATION   CHIEF COMPLAINT:  High fever and chills.  HISTORY OF PRESENT ILLNESS:  This 65 year old gentleman who was brought to the emergency room.  He was having high fever and CT scan abdomen was done, it showed that it is a small left perinephric abscess, and he is not having any pain in the left flank.  Has no history of any urological problems.  He was admitted, started on an IV antibiotics, Levaquin, piperacillin which is Zosyn and vancomycin.  Clinically, he is doing much better.  The temperature has become normal today.  He is not having any pain.  His white count was 11.6 on admission, now it is 9.8.  His hematocrit on December 9 was 27.2, now it is 28.1.  The patient is diabetic.  He was taking oral medications for that.  His BUN is 8, creatinine is 1.08, stable.  PAST MEDICAL HISTORY:  He has diabetes, non-insulin, plus hypertension, never had any surgery.  SOCIAL HISTORY:  He does not smoke or drink.  Takes illicit drug.  REVIEW OF SYSTEMS:  Unremarkable.  PHYSICAL EXAMINATION:  GENERAL:  Moderately built, not in acute distress, fully conscious, alert and oriented. VITAL SIGNS:  Blood pressure is 130/78. ABDOMEN:  Slightly distended, but nontender.  No CVA tenderness. EXTERNAL GENITALIA:  Unremarkable. RECTAL:  Deferred.  He was also started on sliding scale insulin coverage.  His glucose has gradually comes down to a much better level.  LAB WORK:  His urine culture which was done, clean-catch specimen showing no growth so far.  His Accu-Chek are getting better per the sliding scale insulin coverage.  His blood cultures are also showing no growth.  On December 8, his WBC count  was 16.8.  On December 10, his white count has come down to normal range at 9.8.  I reviewed his CT scan and renal ultrasound, both showing small perinephric abscess on the left side.  IMPRESSION:  Small left perinephric abscess.  PLAN:  Continue the antibiotics.  It looks like clinically he is improving and I want to watch couple of days and repeat his ultrasound. If the abscess disappears, he will be fine, otherwise, we can always get the radiologist to drain it percutaneously.  I appreciate consult from Dr. Alm Bustard.  It should be mentioned his blood glucose was 509 on admission and it has come down with insulin coverage to 127 today.  So we will repeat his renal ultrasound on day after tomorrow.     Marissa Nestle, M.D.     MIJ/MEDQ  D:  10/07/2011  T:  10/08/2011  Job:  [id number]

## 2011-10-08 NOTE — Progress Notes (Signed)
Inpatient Diabetes Program Recommendations  AACE/ADA: New Consensus Statement on Inpatient Glycemic Control (2009)  Target Ranges:  Prepandial:   less than 140 mg/dL      Peak postprandial:   less than 180 mg/dL (1-2 hours)      Critically ill patients:  140 - 180 mg/dL   Reason for Visit: Elevated prandial glucose:  122, 170, 204, 185 mg/dL  Inpatient Diabetes Program Recommendations Oral Agents: Consider adding a DDP-4 inhibitor to medication regimen (ie.  Lemar Lofty, or Onglyza) Outpatient Referral: [facility] class information given to patient

## 2011-10-08 NOTE — Progress Notes (Signed)
Subjective: The patient continues to have no complaints.  Objective: Vital signs in last 24 hours: Filed Vitals:   10/07/11 1824 10/07/11 2226 10/08/11 0211 10/08/11 0458  BP: 113/72 143/78 109/62 115/70  Pulse: 83 78 77 82  Temp: 98.7 F (37.1 C) 98.6 F (37 C) 98.6 F (37 C) 98.3 F (36.8 C)  TempSrc: Oral Oral Oral   Resp: 20 20 20 20   Height:      Weight:      SpO2: 99% 100% 99% 99%   Weight change:   Intake/Output Summary (Last 24 hours) at 10/08/11 1210 Last data filed at 10/08/11 0500  Gross per 24 hour  Intake   4680 ml  Output      0 ml  Net   4680 ml   Physical Exam: General: No acute distress. Lungs clear to auscultation bilaterally, except decreased breath sounds in the bases. cardiovascular regular rate rhythm without murmurs gallops rubs Abdomen soft nontender nondistended, no left flank tenderness. Extremities: His right leg is slightly larger than the left leg but no significant edema. Skin: Left arm IV removed. Mild residual erythema. Nontender.  Lab Results: Basic Metabolic Panel:  Lab 0000000 0523 10/06/11 0453  NA 137 132*  K 3.6 3.7  CL 103 99  CO2 26 25  GLUCOSE 95 144*  BUN 5* 8  CREATININE 1.17 1.08  CALCIUM 9.2 9.0  MG -- --  PHOS -- --   Liver Function Tests:  Lab 10/01/11 1736  AST 14  ALT 14  ALKPHOS 79  BILITOT 0.5  PROT 7.4  ALBUMIN 2.8*   No results found for this basename: LIPASE:2,AMYLASE:2 in the last 168 hours No results found for this basename: AMMONIA:2 in the last 168 hours CBC:  Lab 10/08/11 0523 10/06/11 0453 10/03/11 0545 10/01/11 1736  WBC 5.8 9.8 -- --  NEUTROABS -- -- 11.5* 12.3*  HGB 8.9* 9.2* -- --  HCT 27.7* 28.1* -- --  MCV 82.9 82.4 -- --  PLT 337 253 -- --   Cardiac Enzymes: No results found for this basename: CKTOTAL:3,CKMB:3,CKMBINDEX:3,TROPONINI:3 in the last 168 hours BNP: No components found with this basename: POCBNP:3 D-Dimer: No results found for this basename: DDIMER:2 in the  last 168 hours CBG:  Lab 10/08/11 1143 10/08/11 0756 10/07/11 2051 10/07/11 1701 10/07/11 1109 10/07/11 0725  GLUCAP 220* 83 185* 204* 170* 122*   Hemoglobin A1C:  Lab 10/01/11 1727  HGBA1C 13.9*   Fasting Lipid Panel: No results found for this basename: CHOL,HDL,LDLCALC,TRIG,CHOLHDL,LDLDIRECT in the last 168 hours Thyroid Function Tests:  Lab 10/05/11 0622  TSH 1.700  T4TOTAL --  FREET4 0.99  T3FREE --  THYROIDAB --   Coagulation:  Lab 10/07/11 1417  LABPROT 15.3*  INR 1.18   Anemia Panel:  Lab 10/03/11 0545 10/01/11 1727  VITAMINB12 414 --  FOLATE -- --  FERRITIN -- 72  TIBC -- Not calculated due to Iron <10.  IRON -- <10*  RETICCTPCT -- --   HIV nonreactive  Urine strep pneumonia antigen negative  Micro Results: Recent Results (from the past 240 hour(s))  CULTURE, BLOOD (ROUTINE X 2)     Status: Normal   Collection Time   10/01/11  5:31 PM      Component Value Range Status Comment   Specimen Description BLOOD LEFT HAND DRAWN BY RN   Final    Special Requests     Final    Value: BOTTLES DRAWN AEROBIC AND ANAEROBIC AEB=8CC ANA=6CC   Setup Time [id number]  Final    Culture     Final    Value: GROUP B STREP(S.AGALACTIAE)ISOLATED     Note: Gram Stain Report Called to,Read Back By and Verified With: LATOYA FOOTE RN ON 10/02/11 @ 0730 BY RESSEGGER R Performed at [facility]   Report Status 10/04/2011 FINAL   Final    Organism ID, Bacteria GROUP B STREP(S.AGALACTIAE)ISOLATED   Final   CULTURE, BLOOD (ROUTINE X 2)     Status: Normal   Collection Time   10/01/11  5:36 PM      Component Value Range Status Comment   Specimen Description BLOOD RIGHT ANTECUBITAL   Final    Special Requests BOTTLES DRAWN AEROBIC AND ANAEROBIC [facility]   Final    Setup Time [id number]   Final    Culture     Final    Value: GROUP B STREP(S.AGALACTIAE)ISOLATED     Note: SUSCEPTIBILITIES PERFORMED ON PREVIOUS CULTURE WITHIN THE LAST 5 DAYS.     Note: Gram Stain Report Called  to,Read Back By and Verified With: Morene Antu RN @ [phone] ON 10/02/11 BY RESSEGGER R Performed at [facility]   Report Status 10/04/2011 FINAL   Final   CULTURE, BLOOD (ROUTINE X 2)     Status: Normal (Preliminary result)   Collection Time   10/03/11  6:50 AM      Component Value Range Status Comment   Specimen Description BLOOD RIGHT ARM   Final    Special Requests BOTTLES DRAWN AEROBIC AND ANAEROBIC 6CC   Final    Culture NO GROWTH 4 DAYS   Final    Report Status PENDING   Incomplete   CULTURE, BLOOD (ROUTINE X 2)     Status: Normal (Preliminary result)   Collection Time   10/03/11  6:53 AM      Component Value Range Status Comment   Specimen Description BLOOD RIGHT ARM   Final    Special Requests BOTTLES DRAWN AEROBIC AND ANAEROBIC 6CC   Final    Culture NO GROWTH 4 DAYS   Final    Report Status PENDING   Incomplete   URINE CULTURE     Status: Normal   Collection Time   10/05/11  3:46 PM      Component Value Range Status Comment   Specimen Description URINE, CLEAN CATCH   Final    Special Requests NONE   Final    Setup Time [id number]   Final    Colony Count NO GROWTH   Final    Culture NO GROWTH   Final    Report Status 10/07/2011 FINAL   Final   CULTURE, BLOOD (ROUTINE X 2)     Status: Normal (Preliminary result)   Collection Time   10/06/11  9:26 AM      Component Value Range Status Comment   Specimen Description BLOOD LEFT ANTECUBITAL   Final    Special Requests BOTTLES DRAWN AEROBIC AND ANAEROBIC 6CC   Final    Culture NO GROWTH 1 DAY   Final    Report Status PENDING   Incomplete   CULTURE, BLOOD (ROUTINE X 2)     Status: Normal (Preliminary result)   Collection Time   10/06/11  9:26 AM      Component Value Range Status Comment   Specimen Description BLOOD LEFT HAND   Final    Special Requests BOTTLES DRAWN AEROBIC AND ANAEROBIC 6CC   Final    Culture NO GROWTH 1 DAY   Final  Report Status PENDING   Incomplete    Studies/Results: US Venous Img Lower  Bilateral  10/06/2011  *RADIOLOGY REPORT*  Clinical Data: Bilateral lower extremity edema, history diabetes  VENOUS DUPLEX ULTRASOUND OF BILATERAL LOWER EXTREMITIES  Technique:  Gray-scale sonography with graded compression, as well as color Doppler and duplex ultrasound, were performed to evaluate the deep venous system of both lower extremities from the level of the common femoral vein through the popliteal and proximal calf veins.  Spectral Doppler was utilized to evaluate flow at rest and with distal augmentation maneuvers.  Comparison:  None  Findings: Deep venous systems appear patent and compressible from groin to popliteal fossa bilaterally.  Spontaneous venous flow present with intact augmentation and evidence of respiratory phasicity.  No intraluminal thrombus identified.  Flow within the right popliteal vein is slightly sluggish.  Visualized portions of the greater saphenous systems appear patent.  IMPRESSION: No evidence of deep venous thrombosis in the lower extremities.  Original Report Authenticated By: Burnetta Sabin, M.D.   Scheduled Meds:    . glimepiride  4 mg Oral Q breakfast  . insulin aspart  0-20 Units Subcutaneous TID WC  . insulin aspart  0-5 Units Subcutaneous QHS  . insulin glargine  20 Units Subcutaneous QHS  . levofloxacin (LEVAQUIN) IV  750 mg Intravenous Q24H  . piperacillin-tazobactam (ZOSYN)  IV  3.375 g Intravenous Q8H  . polyethylene glycol  17 g Oral Daily  . senna-docusate  1 tablet Oral BID   Continuous Infusions:    . 0.9 % NaCl with KCl 20 mEq / L 1,000 mL (10/06/11 0641)   PRN Meds:.acetaminophen Assessment/Plan:  Acute left pyelonephritis and small perinephric abscess. The CT scan of his abdomen and ultrasound of his kidneys were reviewed with the radiologist Dr. Thornton Papas. Subsequently, I discussed the patient with Dr. Michela Pitcher. The small perinephric abscess may be amenable to drainage, however, he is now afebrile for approximately 48 hours and the decision  has been made to monitor her him on Zosyn and to repeat the renal ultrasound tomorrow. If there appears to be improvement, will continue IV antibiotics. If the abscess is larger, he will need to have percutaneous drainage. Of note, the followup urine and blood cultures have been negative. Will discontinue Levaquin.     Group B strep bacteremia, confirmed via of blood cultures. Likely from acute pyelonephritis. Interestingly, his urine cultures have been unremarkable.  Persistent fever, now resolved on Zosyn. The bilateral lower extremity venous ultrasound was negative for DVT.  Status post removal of the left IV do to erythema.    DM (diabetes mellitus), type 2, uncontrolled: Hemoglobin A1c was almost 14. Patient never checks his blood glucoses at home and was only on 500 mg of metformin twice a day. Patient has remained on resistant sliding scale NovoLog. Amaryl 4 mg and Lantus were added. I doubt patient would be compliant with insulin at home, as he does not even check his blood glucoses. The registered dietitian's assessment and  recommendations noted and appreciated. His blood glucose is trending downward. We'll change sliding scale NovoLog to the moderate scale, decreased from resistant scale. Continue Amaryl and Lantus.  Metformin has been discontinued temporarily to to IV contrast from the CT scan.   Anemia, iron deficiency: He is status post 2 units of packed red blood cell transfusions. His hemoglobin improved appropriately. Patient has no active bleeding currently, and his hemoglobin drop may be somewhat in part dilutional. Hemoccult of the stools are pending. However, he  will need an elective colonoscopy and/or upper endoscopy once his fever stabilizes. Hold off on oral iron supplementation for now, but ultimately he will need iron therapy and further workup. The CT of his abdomen and pelvis revealed no obvious: Or small intestinal masses. There was a 1.4 cm soft tissue structure noted  just lateral to the first portion of the duodenum. This may be a diverticulum but it is not certain. As above, the patient will ultimately need an upper endoscopy and colonoscopy.   Mild hyponatremia. On gentle IV fluids.  Question mild mental retardation. Will ask for a social worker/case management consultation to evaluate or look into living environment and possible lack of understanding regarding medical treatment.  His thyroid function is within normal limits.   LOS: 7 days   Zachary [name] 10/08/2011, 12:10 PM

## 2011-10-09 ENCOUNTER — Inpatient Hospital Stay (HOSPITAL_COMMUNITY): Payer: Medicare Other

## 2011-10-09 LAB — GLUCOSE, CAPILLARY
Glucose-Capillary: 115 mg/dL — ABNORMAL HIGH (ref 70–99)
Glucose-Capillary: 160 mg/dL — ABNORMAL HIGH (ref 70–99)
Glucose-Capillary: 172 mg/dL — ABNORMAL HIGH (ref 70–99)
Glucose-Capillary: 107 mg/dL — ABNORMAL HIGH (ref 70–99)

## 2011-10-09 MED ORDER — SENNOSIDES-DOCUSATE SODIUM 8.6-50 MG PO TABS: 2.0000 | ORAL_TABLET | Freq: Every day | ORAL | Status: DC | PRN

## 2011-10-09 MED ORDER — METFORMIN HCL 500 MG PO TABS
500.0000 mg | ORAL_TABLET | Freq: Two times a day (BID) | ORAL | Status: DC
  Administered 2011-10-09 – 2011-10-10 (×2): 500 mg via ORAL
  Filled 2011-10-09 (×2): qty 1

## 2011-10-09 MED ORDER — AMPICILLIN 250 MG PO CAPS
500.0000 mg | ORAL_CAPSULE | Freq: Four times a day (QID) | ORAL | Status: DC
  Administered 2011-10-09 – 2011-10-10 (×4): 500 mg via ORAL
  Filled 2011-10-09 (×17): qty 2

## 2011-10-09 MED ORDER — POLYETHYLENE GLYCOL 3350 17 G PO PACK: 17.0000 g | PACK | Freq: Every day | ORAL | Status: DC | PRN

## 2011-10-09 NOTE — Progress Notes (Signed)
ANTIBIOTIC CONSULT NOTE   Pharmacy Consult for Zosyn Indication: pneumonia /pyelonephritis/Group B strep bacteremia  No Known Allergies  Patient Measurements: Height: 5\' 8"  (172.7 cm) Weight: 189 lb 9.5 oz (86 kg) IBW/kg (Calculated) : 68.4   Vital Signs: Temp: 97.9 F (36.6 C) (12/13 1005) Temp src: Oral (12/13 0642) BP: 117/73 mmHg (12/13 1005) Pulse Rate: 80  (12/13 1005) Intake/Output from previous day:   Intake/Output from this shift:    Labs:  [facility] 10/08/11 0523  WBC 5.8  HGB 8.9*  PLT 337  LABCREA --  CREATININE 1.17   Estimated Creatinine Clearance: 68.9 ml/min (by C-G formula based on Cr of 1.17). No results found for this basename: VANCOTROUGH:2,VANCOPEAK:2,VANCORANDOM:2,GENTTROUGH:2,GENTPEAK:2,GENTRANDOM:2,TOBRATROUGH:2,TOBRAPEAK:2,TOBRARND:2,AMIKACINPEAK:2,AMIKACINTROU:2,AMIKACIN:2, in the last 72 hours  Microbiology: Recent Results (from the past 720 hour(s))  CULTURE, BLOOD (ROUTINE X 2)     Status: Normal   Collection Time   10/01/11  5:31 PM      Component Value Range Status Comment   Specimen Description BLOOD LEFT HAND DRAWN BY RN   Final    Special Requests     Final    Value: BOTTLES DRAWN AEROBIC AND ANAEROBIC AEB=8CC ANA=6CC   Setup Time [id number]   Final    Culture     Final    Value: GROUP B STREP(S.AGALACTIAE)ISOLATED     Note: Gram Stain Report Called to,Read Back By and Verified With: Morene Antu RN ON 10/02/11 @ 0730 BY RESSEGGER [name] Performed at [facility]   Report Status 10/04/2011 FINAL   Final    Organism ID, Bacteria GROUP B STREP(S.AGALACTIAE)ISOLATED   Final   CULTURE, BLOOD (ROUTINE X 2)     Status: Normal   Collection Time   10/01/11  5:36 PM      Component Value Range Status Comment   Specimen Description BLOOD RIGHT ANTECUBITAL   Final    Special Requests BOTTLES DRAWN AEROBIC AND ANAEROBIC [facility]   Final    Setup Time [id number]   Final    Culture     Final    Value: GROUP B STREP(S.AGALACTIAE)ISOLATED   Note: SUSCEPTIBILITIES PERFORMED ON PREVIOUS CULTURE WITHIN THE LAST 5 DAYS.     Note: Gram Stain Report Called to,Read Back By and Verified With: Morene Antu RN @ [phone] ON 10/02/11 BY RESSEGGER [name] Performed at [facility]   Report Status 10/04/2011 FINAL   Final   CULTURE, BLOOD (ROUTINE X 2)     Status: Normal   Collection Time   10/03/11  6:50 AM      Component Value Range Status Comment   Specimen Description BLOOD RIGHT ARM   Final    Special Requests BOTTLES DRAWN AEROBIC AND ANAEROBIC [place]   Final    Culture NO GROWTH 5 DAYS   Final    Report Status 10/08/2011 FINAL   Final   CULTURE, BLOOD (ROUTINE X 2)     Status: Normal   Collection Time   10/03/11  6:53 AM      Component Value Range Status Comment   Specimen Description BLOOD RIGHT ARM   Final    Special Requests BOTTLES DRAWN AEROBIC AND ANAEROBIC [place]   Final    Culture NO GROWTH 5 DAYS   Final    Report Status 10/08/2011 FINAL   Final   URINE CULTURE     Status: Normal   Collection Time   10/05/11  3:46 PM      Component Value Range Status Comment   Specimen Description URINE,  CLEAN CATCH   Final    Special Requests NONE   Final    Setup Time [id number]   Final    Colony Count NO GROWTH   Final    Culture NO GROWTH   Final    Report Status 10/07/2011 FINAL   Final   CULTURE, BLOOD (ROUTINE X 2)     Status: Normal (Preliminary result)   Collection Time   10/06/11  9:26 AM      Component Value Range Status Comment   Specimen Description BLOOD LEFT ANTECUBITAL   Final    Special Requests BOTTLES DRAWN AEROBIC AND ANAEROBIC 6CC   Final    Culture NO GROWTH 2 DAYS   Final    Report Status PENDING   Incomplete   CULTURE, BLOOD (ROUTINE X 2)     Status: Normal (Preliminary result)   Collection Time   10/06/11  9:26 AM      Component Value Range Status Comment   Specimen Description BLOOD LEFT HAND   Final    Special Requests BOTTLES DRAWN AEROBIC AND ANAEROBIC 6CC   Final    Culture NO GROWTH 2 DAYS   Final     Report Status PENDING   Incomplete     Medical History: Past Medical History  Diagnosis Date  . Hypertension   . Diabetes mellitus   . Smoking   . Abnormal CT scan, kidney 10/06/2011  . UTI (lower urinary tract infection) 10/06/2011  . Bladder wall thickening 10/06/2011  . Acute pyelonephritis 10/07/2011  . Perinephric abscess 10/07/2011    Medications:  Anti-infectives     Start     Dose/Rate Route Frequency Ordered Stop   10/06/11 1000   piperacillin-tazobactam (ZOSYN) IVPB 3.375 g        3.375 g 12.5 mL/hr over 240 Minutes Intravenous Every 8 hours 10/06/11 0918     10/04/11 1300   Levofloxacin (LEVAQUIN) IVPB 750 mg  Status:  Discontinued        750 mg 100 mL/hr over 90 Minutes Intravenous Every 24 hours 10/04/11 1200 10/08/11 1223   10/03/11 1000   azithromycin (ZITHROMAX) tablet 250 mg  Status:  Discontinued        250 mg Oral Daily 10/03/11 0932 10/04/11 1200   10/02/11 2000   vancomycin (VANCOCIN) IVPB 1000 mg/200 mL premix  Status:  Discontinued        1,000 mg 200 mL/hr over 60 Minutes Intravenous Every 12 hours 10/02/11 0714 10/07/11 1121   10/02/11 0700   vancomycin (VANCOCIN) IVPB 1000 mg/200 mL premix        1,000 mg 200 mL/hr over 60 Minutes Intravenous  Once 10/02/11 0658 10/02/11 0950   10/01/11 2345   cefTRIAXone (ROCEPHIN) 1 g in dextrose 5 % 50 mL IVPB  Status:  Discontinued        1 g 100 mL/hr over 30 Minutes Intravenous Every 24 hours 10/01/11 2335 10/06/11 0837   10/01/11 2345   azithromycin (ZITHROMAX) 500 mg in dextrose 5 % 250 mL IVPB  Status:  Discontinued        500 mg 250 mL/hr over 60 Minutes Intravenous Every 24 hours 10/01/11 2335 10/03/11 0932   10/01/11 2138   dextrose 5 % with cefTRIAXone (ROCEPHIN) ADS Med     Comments: Poindexter, MELINDA: cabinet override         10/01/11 2138 10/01/11 2238   10/01/11 2030   azithromycin (ZITHROMAX) 500 mg in dextrose 5 % 250 mL IVPB  500 mg 250 mL/hr over 60 Minutes Intravenous  Once  10/01/11 2021 10/01/11 2335   10/01/11 2030   cefTRIAXone (ROCEPHIN) 1 g in dextrose 5 % 50 mL IVPB        1 g 120 mL/hr over 30 Minutes Intravenous  Once 10/01/11 2021 10/01/11 2238         Assessment: Okay for Protocol  Goal of Therapy:  Eradicate infection.  Plan:  Labs per protocol Addendum: Zosyn added 12/10:  3.375gm iv q8hrs No changes needed.   Zachary [name] 10/09/2011,10:21 AM

## 2011-10-09 NOTE — Plan of Care (Signed)
Problem: Discharge Progression Outcomes Goal: Complications resolved/controlled Outcome: Progressing MD to repeat US of kidneys prior to d/c

## 2011-10-09 NOTE — Progress Notes (Signed)
Subjective: No complaints. Wants to go home. Objective: Vital signs in last 24 hours: Filed Vitals:   10/08/11 2339 10/09/11 0210 10/09/11 0642 10/09/11 1005  BP:  113/67 146/78 117/73  Pulse:  80 79 80  Temp:  98.7 F (37.1 C) 98.7 F (37.1 C) 97.9 F (36.6 C)  TempSrc:  Oral Oral   Resp:  20 18 20   Height:      Weight:      SpO2: 98% 96% 99% 99%   Weight change:  No intake or output data in the 24 hours ending 10/09/11 1408 Physical Exam: General: Nontoxic. Watching TV. Much more alert than last week when I evaluated him. Lungs clear to auscultation bilaterally without wheezes rhonchi or rales Cardiovascular regular rate rhythm without murmurs gallops rubs Abdomen soft nontender nondistended Back no CVA tenderness Extremities no clubbing cyanosis or edema  Lab Results: Basic Metabolic Panel:  Lab 0000000 0523 10/06/11 0453  NA 137 132*  K 3.6 3.7  CL 103 99  CO2 26 25  GLUCOSE 95 144*  BUN 5* 8  CREATININE 1.17 1.08  CALCIUM 9.2 9.0  MG -- --  PHOS -- --   Liver Function Tests: No results found for this basename: AST:2,ALT:2,ALKPHOS:2,BILITOT:2,PROT:2,ALBUMIN:2 in the last 168 hours No results found for this basename: LIPASE:2,AMYLASE:2 in the last 168 hours No results found for this basename: AMMONIA:2 in the last 168 hours CBC:  Lab 10/08/11 0523 10/06/11 0453 10/03/11 0545  WBC 5.8 9.8 --  NEUTROABS -- -- 11.5*  HGB 8.9* 9.2* --  HCT 27.7* 28.1* --  MCV 82.9 82.4 --  PLT 337 253 --   Cardiac Enzymes: No results found for this basename: CKTOTAL:3,CKMB:3,CKMBINDEX:3,TROPONINI:3 in the last 168 hours BNP: No components found with this basename: POCBNP:3 D-Dimer: No results found for this basename: DDIMER:2 in the last 168 hours CBG:  Lab 10/09/11 1143 10/09/11 0732 10/08/11 2024 10/08/11 1613 10/08/11 1143 10/08/11 0756  GLUCAP 160* 115* 222* 113* 220* 83   Hemoglobin A1C: No results found for this basename: HGBA1C in the last 168 hours Fasting  Lipid Panel: No results found for this basename: CHOL,HDL,LDLCALC,TRIG,CHOLHDL,LDLDIRECT in the last 168 hours Thyroid Function Tests:  Lab 10/05/11 0622  TSH 1.700  T4TOTAL --  FREET4 0.99  T3FREE --  THYROIDAB --   Coagulation:  Lab 10/07/11 1417  LABPROT 15.3*  INR 1.18   Anemia Panel:  Lab 10/03/11 0545  VITAMINB12 414  FOLATE --  FERRITIN --  TIBC --  IRON --  RETICCTPCT --   Micro Results: Recent Results (from the past 240 hour(s))  CULTURE, BLOOD (ROUTINE X 2)     Status: Normal   Collection Time   10/01/11  5:31 PM      Component Value Range Status Comment   Specimen Description BLOOD LEFT HAND DRAWN BY RN   Final    Special Requests     Final    Value: BOTTLES DRAWN AEROBIC AND ANAEROBIC AEB=8CC ANA=6CC   Setup Time [id number]   Final    Culture     Final    Value: GROUP B STREP(S.AGALACTIAE)ISOLATED     Note: Gram Stain Report Called to,Read Back By and Verified With: Morene Antu RN ON 10/02/11 @ 0730 BY RESSEGGER R Performed at [facility]   Report Status 10/04/2011 FINAL   Final    Organism ID, Bacteria GROUP B STREP(S.AGALACTIAE)ISOLATED   Final   CULTURE, BLOOD (ROUTINE X 2)     Status: Normal  Collection Time   10/01/11  5:36 PM      Component Value Range Status Comment   Specimen Description BLOOD RIGHT ANTECUBITAL   Final    Special Requests BOTTLES DRAWN AEROBIC AND ANAEROBIC [facility]   Final    Setup Time [id number]   Final    Culture     Final    Value: GROUP B STREP(S.AGALACTIAE)ISOLATED     Note: SUSCEPTIBILITIES PERFORMED ON PREVIOUS CULTURE WITHIN THE LAST 5 DAYS.     Note: Gram Stain Report Called to,Read Back By and Verified With: Morene Antu RN @ [phone] ON 10/02/11 BY RESSEGGER R Performed at [facility]   Report Status 10/04/2011 FINAL   Final   CULTURE, BLOOD (ROUTINE X 2)     Status: Normal   Collection Time   10/03/11  6:50 AM      Component Value Range Status Comment   Specimen Description BLOOD RIGHT ARM   Final     Special Requests BOTTLES DRAWN AEROBIC AND ANAEROBIC [place]   Final    Culture NO GROWTH 5 DAYS   Final    Report Status 10/08/2011 FINAL   Final   CULTURE, BLOOD (ROUTINE X 2)     Status: Normal   Collection Time   10/03/11  6:53 AM      Component Value Range Status Comment   Specimen Description BLOOD RIGHT ARM   Final    Special Requests BOTTLES DRAWN AEROBIC AND ANAEROBIC [place]   Final    Culture NO GROWTH 5 DAYS   Final    Report Status 10/08/2011 FINAL   Final   URINE CULTURE     Status: Normal   Collection Time   10/05/11  3:46 PM      Component Value Range Status Comment   Specimen Description URINE, CLEAN CATCH   Final    Special Requests NONE   Final    Setup Time [id number]   Final    Colony Count NO GROWTH   Final    Culture NO GROWTH   Final    Report Status 10/07/2011 FINAL   Final   CULTURE, BLOOD (ROUTINE X 2)     Status: Normal (Preliminary result)   Collection Time   10/06/11  9:26 AM      Component Value Range Status Comment   Specimen Description BLOOD LEFT ANTECUBITAL   Final    Special Requests BOTTLES DRAWN AEROBIC AND ANAEROBIC 6CC   Final    Culture NO GROWTH 2 DAYS   Final    Report Status PENDING   Incomplete   CULTURE, BLOOD (ROUTINE X 2)     Status: Normal (Preliminary result)   Collection Time   10/06/11  9:26 AM      Component Value Range Status Comment   Specimen Description BLOOD LEFT HAND   Final    Special Requests BOTTLES DRAWN AEROBIC AND ANAEROBIC 6CC   Final    Culture NO GROWTH 2 DAYS   Final    Report Status PENDING   Incomplete    Studies/Results: US Renal  10/09/2011  *RADIOLOGY REPORT*  Clinical Data: History of perinephric abscess.  RENAL/URINARY TRACT ULTRASOUND COMPLETE  Comparison:  CT abdomen and pelvis 10/05/2011 and renal ultrasound 10/06/2011.  Findings:  Right Kidney:  Measures 11.8 cm.  Fullness of the renal pelvis is unchanged.  No stone or mass.  Left Kidney:  Measures 11.9 cm.  Perinephric fluid collection off the upper pole  with a fissure extending  into the kidney compatible with perinephric abscess is again seen and unchanged in appearance. The collection measures approximately 2.0 x 0.6 centimeters.  Cyst measuring 2.1 cm in diameter in the upper pole is stable appearance. Very small left pleural effusion is noted.  Bladder:  Thickening and urinary bladder wall again seen.  IMPRESSION:  1.  No interval change in the appearance of perinephric fluid collection off the upper pole of the left kidney compatible with abscess. 2.  No change in wall thickening of the urinary bladder. 3.  Trace left pleural effusion.  Original Report Authenticated By: Arvid Right. D'ALESSIO, M.D.   Scheduled Meds:   . ampicillin  500 mg Oral Q6H  . glimepiride  4 mg Oral Q breakfast  . insulin aspart  0-15 Units Subcutaneous TID WC  . insulin aspart  0-5 Units Subcutaneous QHS  . insulin glargine  20 Units Subcutaneous QHS  . metFORMIN  500 mg Oral BID WC  . DISCONTD: piperacillin-tazobactam (ZOSYN)  IV  3.375 g Intravenous Q8H  . DISCONTD: polyethylene glycol  17 g Oral Daily  . DISCONTD: senna-docusate  1 tablet Oral BID   Continuous Infusions:   . DISCONTD: 0.9 % NaCl with KCl 20 mEq / [name] 1,000 mL (10/08/11 2338)   PRN Meds:.acetaminophen, polyethylene glycol, senna-docusate Assessment/Plan: Principal Problem:  *Acute pyelonephritis Active Problems:  DM (diabetes mellitus), type 2, uncontrolled  Anemia  Bacteremia  Perinephric abscess  Bladder wall thickening  Abnormal CT scan, kidney  Patient's ultrasound is no worse. Clinically he is much better. He is on day #8 or 9 of antibiotics. Will change to by mouth. He's not having any fevers. Not sure that percutaneous drainage of the abscess would help at this point as clinically he is much improved. Hep-Lock IV.  Resume metformin  Outpatient workup of anemia.  LOS: 8 days   Zachary [name] 10/09/2011, 2:08 PM

## 2011-10-10 DIAGNOSIS — F79 Unspecified intellectual disabilities: Secondary | ICD-10-CM | POA: Diagnosis present

## 2011-10-10 LAB — GLUCOSE, CAPILLARY
Glucose-Capillary: 66 mg/dL — ABNORMAL LOW (ref 70–99)
Glucose-Capillary: 122 mg/dL — ABNORMAL HIGH (ref 70–99)
Glucose-Capillary: 121 mg/dL — ABNORMAL HIGH (ref 70–99)

## 2011-10-10 MED ORDER — GLIMEPIRIDE 4 MG PO TABS: 4.0000 mg | ORAL_TABLET | Freq: Every day | ORAL | Status: DC

## 2011-10-10 MED ORDER — FERROUS SULFATE 325 (65 FE) MG PO TBEC: 325.0000 mg | DELAYED_RELEASE_TABLET | Freq: Every day | ORAL | Status: DC

## 2011-10-10 MED ORDER — CIPROFLOXACIN HCL 500 MG PO TABS: 500.0000 mg | ORAL_TABLET | Freq: Two times a day (BID) | ORAL | Status: AC

## 2011-10-10 MED ORDER — PIOGLITAZONE HCL 30 MG PO TABS: 30.0000 mg | ORAL_TABLET | Freq: Every day | ORAL | Status: DC

## 2011-10-10 MED ORDER — METFORMIN HCL 1000 MG PO TABS: 1000.0000 mg | ORAL_TABLET | Freq: Two times a day (BID) | ORAL | Status: DC

## 2011-10-10 NOTE — Discharge Summary (Signed)
Physician Discharge Summary  Patient ID: Zachary [name] MRN: [id number] DOB/AGE: 63/05/1946 78 y.o.  Admit date: 10/01/2011 Discharge date: 10/10/2011  Discharge Diagnoses:  Principal Problem:  *Acute pyelonephritis with  Perinephric abscess  DM (diabetes mellitus), type 2, uncontrolled  Anemia, iron deficiency, needs outpatient GI workup  Bacteremia, group B strep  Bladder wall thickening  Mental retardation   Current Discharge Medication List    START taking these medications   Details  ciprofloxacin (CIPRO) 500 MG tablet Take 1 tablet (500 mg total) by mouth 2 (two) times daily. Qty: 8 tablet, Refills: 0    glimepiride (AMARYL) 4 MG tablet Take 1 tablet (4 mg total) by mouth daily with breakfast. Qty: 30 tablet, Refills: 0    pioglitazone (ACTOS) 30 MG tablet Take 1 tablet (30 mg total) by mouth daily. Qty: 30 tablet, Refills: 0      CONTINUE these medications which have CHANGED   Details  metFORMIN (GLUCOPHAGE) 1000 MG tablet Take 1 tablet (1,000 mg total) by mouth 2 (two) times daily. Qty: 60 tablet, Refills: 0        follow up: Dr. Michela Pitcher, Dr. Laural Golden for iron deficiency anemia, and primary care provider to monitor diabetes and establish care.  Disposition: Home or Self Care with aide  Discharged Condition: stable  Consults:  [facility]  Labs:    Sodium135  132 131 132  137      Potassium  4.0  3.5 4.1 3.7  3.6      Chloride  101  97 98 99  103      CO2  19  23 24 25  26       Mean Plasma Glucose  352            BUN  14  12 11 8  5       Creatinine, Ser  1.13  0.99 1.07 1.08  1.17      Calcium  8.4  8.8 8.8 9.0  9.2       Calcium, Ion  1.10            Alkaline Phosphatase  79            Albumin  2.8            AST  14            ALT  14            Total Protein  7.4            Total Bilirubin  0.5             IRON /ANEMIA PROFILE   Iron  <10            UIBC  326            TIBC  Not calculated due to Iron <10.            Saturation Ratios  Not  calculated due to Iron <10.            Ferritin  72            RBC Folate   779            OTHER CHEM   Lactic Acid, Venous  2.3            Procalcitonin  1.28            Vitamin B-12   414  PROTEIN ELP   Total Protein  7.4             CBC   WBC  13.4 14.5 16.8 11.6 9.8  5.8      RBC  2.85 2.55 3.50 3.31 3.41  3.34      Hemoglobin  7.5 6.8 9.4 8.9 9.2  8.9      HCT  24.2 21.3 28.7 27.2 28.1  27.7      MCV  84.9 83.5 82.0 82.2 82.4  82.9      MCH  26.3 26.7 26.9 26.9 27.0  26.6      MCHC  31.0 31.9 32.8 32.7 32.7  32.1      RDW  14.9 14.7 14.7 14.6 14.5  14.5      Platelets  171 171 192 202 253  337      DIFFERENTIAL   Neutrophils Relative  81 80           Lymphocytes Relative  7 10           Monocytes Relative  12 10           Eosinophils Relative  0 0           Basophils Relative  0 0           Neutro Abs  12.3 11.5           Lymphs Abs  1.0 1.5           Monocytes Absolute  1.8 1.4           Eosinophils Absolute  0.0 0.0           Basophils Absolute  0.0 0.0           PROTIME W/ INR   Prothrombin Time        15.3      INR        1.18      PTT   aPTT        30        DIABETES   Hemoglobin A1C  13.9             THYROID   TSH     1.700         Free T4     0.99         BLOOD TYPE/TRANSFUSIONS    NEGATIVE            Influenza B By PCR  NEGATIVE            H1N1 flu by pcr  NOT DETECTED              HIV TESTS    HIV  NON REACTIVE             URINALYSIS    Color, Urine  STRAW    YELLOW        APPearance  CLEAR    CLEAR        Specific Gravity, Urine  1.010    1.010        pH  5.5    5.5        Glucose, UA  1000 mg/dL">1000    500        Bilirubin Urine  NEGATIVE    NEGATIVE        Ketones, ur  40    TRACE        Protein, ur  NEGATIVE    NEGATIVE  Urobilinogen, UA  0.2    0.2        Nitrite  NEGATIVE    NEGATIVE        Leukocytes, UA  NEGATIVE    NEGATIVE        Urine-Other  RARE YEAST    FEW YEAST        WBC, UA  3-6    11-20        RBC /  HPF  0-2    3-6        Squamous Epithelial / LPF  RARE    FEW        Bacteria, UA  FEW    FEW         URINE CHEMISTRY    Legionella Antigen, Urine  Negative for Legionella pneumophilia serogroup 1             URINE, OTHER    Strep Pneumo Urinary Antigen  NEGATIVE  NEGATIVE             MICROBIOLOGY   Recent Results (from the past 240 hour(s))  CULTURE, BLOOD (ROUTINE X 2)     Status: Normal   Collection Time   10/01/11  5:31 PM      Component Value Range Status Comment   Specimen Description BLOOD LEFT HAND DRAWN BY RN   Final    Special Requests     Final    Value: BOTTLES DRAWN AEROBIC AND ANAEROBIC AEB=8CC ANA=6CC   Setup Time [id number]   Final    Culture     Final    Value: GROUP B STREP(S.AGALACTIAE)ISOLATED     Note: Gram Stain Report Called to,Read Back By and Verified With: Morene Antu RN ON 10/02/11 @ 0730 BY RESSEGGER R Performed at [facility]   Report Status 10/04/2011 FINAL   Final    Organism ID, Bacteria GROUP B STREP(S.AGALACTIAE)ISOLATED   Final   CULTURE, BLOOD (ROUTINE X 2)     Status: Normal   Collection Time   10/01/11  5:36 PM      Component Value Range Status Comment   Specimen Description BLOOD RIGHT ANTECUBITAL   Final    Special Requests BOTTLES DRAWN AEROBIC AND ANAEROBIC [facility]   Final    Setup Time [id number]   Final    Culture     Final    Value: GROUP B STREP(S.AGALACTIAE)ISOLATED     Note: SUSCEPTIBILITIES PERFORMED ON PREVIOUS CULTURE WITHIN THE LAST 5 DAYS.     Note: Gram Stain Report Called to,Read Back By and Verified With: Morene Antu RN @ [phone] ON 10/02/11 BY RESSEGGER R Performed at [facility]   Report Status 10/04/2011 FINAL   Final   CULTURE, BLOOD (ROUTINE X 2)     Status: Normal   Collection Time   10/03/11  6:50 AM      Component Value Range Status Comment   Specimen Description BLOOD RIGHT ARM   Final    Special Requests BOTTLES DRAWN AEROBIC AND ANAEROBIC [place]   Final    Culture NO GROWTH 5 DAYS   Final     Report Status 10/08/2011 FINAL   Final   CULTURE, BLOOD (ROUTINE X 2)     Status: Normal   Collection Time   10/03/11  6:53 AM      Component Value Range Status Comment   Specimen Description BLOOD RIGHT ARM   Final    Special Requests BOTTLES DRAWN AEROBIC AND ANAEROBIC [place]   Final  Culture NO GROWTH 5 DAYS   Final    Report Status 10/08/2011 FINAL   Final   URINE CULTURE     Status: Normal   Collection Time   10/05/11  3:46 PM      Component Value Range Status Comment   Specimen Description URINE, CLEAN CATCH   Final    Special Requests NONE   Final    Setup Time [id number]   Final    Colony Count NO GROWTH   Final    Culture NO GROWTH   Final    Report Status 10/07/2011 FINAL   Final   CULTURE, BLOOD (ROUTINE X 2)     Status: Normal (Preliminary result)   Collection Time   10/06/11  9:26 AM      Component Value Range Status Comment   Specimen Description BLOOD LEFT ANTECUBITAL   Final    Special Requests BOTTLES DRAWN AEROBIC AND ANAEROBIC 6CC   Final    Culture NO GROWTH 4 DAYS   Final    Report Status PENDING   Incomplete   CULTURE, BLOOD (ROUTINE X 2)     Status: Normal (Preliminary result)   Collection Time   10/06/11  9:26 AM      Component Value Range Status Comment   Specimen Description BLOOD LEFT HAND   Final    Special Requests BOTTLES DRAWN AEROBIC AND ANAEROBIC 6CC   Final    Culture NO GROWTH 4 DAYS   Final    Report Status PENDING   Incomplete    Diagnostics:  Dg Chest 2 View  10/04/2011  *RADIOLOGY REPORT*  Clinical Data: Fever.  Follow up bibasilar atelectasis versus pneumonia.  CHEST - 2 VIEW 10/03/2011:  Comparison: Portable chest x-ray 10/01/2011 [facility].  Findings: Cardiomediastinal silhouette unremarkable and unchanged. Suboptimal inspiration.  Resolution of the patchy airspace opacities at the right lung base.  Persistent focal airspace consolidation in the medial left lower lobe.  Lungs otherwise clear.  Possible small bilateral pleural  effusions.  Mild degenerative changes involving the thoracic spine.  IMPRESSION: Suboptimal inspiration.  Resolution of right basilar atelectasis versus pneumonia.  Persistent left lower lobe pneumonia.  No new abnormalities.  Original Report Authenticated By: Deniece Portela, M.D.   Ct Abdomen Pelvis W Contrast  10/05/2011  *RADIOLOGY REPORT*  Clinical Data: Fever and anemia.  CT ABDOMEN AND PELVIS WITH CONTRAST  Technique:  Multidetector CT imaging of the abdomen and pelvis was performed following the standard protocol during bolus administration of intravenous contrast.  Contrast: 176mL OMNIPAQUE IOHEXOL 300 MG/ML IV SOLN  Comparison: Chest x-ray from 10/03/2011  Findings: There is diffuse fatty infiltration of the liver.  7 mm low density lesion in the lateral segment left liver is too small to characterize but is statistically benign, probably a cyst.  The spleen is normal.  The stomach shows a small hiatal hernia is otherwise normal.  1.4 cm soft tissue structure is seen just lateral to the first portion of the duodenum.  This may be a duodenal diverticulum.  I think less likely this represents an isolated lymph node or some extension of pancreatic parenchyma.  Gallbladder is decompressed. The adrenal glands are normal.  Right kidney is unremarkable.  There is edema and fluid around the left kidney and there is asymmetrically decreased perfusion to the left kidney.  Mild fullness of the left intrarenal collecting system is evident and there is mild dilatation of the left ureter following the UVJ.  No abdominal aortic  aneurysm.  No evidence for free fluid or retroperitoneal lymphadenopathy in the abdomen.  Imaging through the pelvis shows no free fluid.  No pelvic sidewall lymphadenopathy.  The prostate gland is prominently enlarged with mass effect on the bladder base.  There is circumferential bladder wall thickening.  No colonic diverticulitis.  The terminal ileum is normal. The appendix is not  visualized, but there is no edema or inflammation in the region of the cecum.  Bone windows reveal no worrisome lytic or sclerotic osseous lesions.  IMPRESSION: Edema and decreased perfusion of the left kidney with mild fullness of the left intrarenal collecting system and mild left hydroureter. A definite etiology for these changes is not evident.  Decreased perfusion can be seen in an obstructive uropathy, and no lesion in the left ureteral vesicle junction could produce these features. The patient has associated circumferential bladder wall thickening which may be secondary to a component of bladder outlet obstruction given the prostatomegaly, but bladder wall thickening could also be secondary to infection. The edema in the left kidney could be secondary to infection.  Recent kidney stone passage or uroepithelial neoplasm would be less likely considerations for this appearance.  Original Report Authenticated By: ERIC A. MANSELL, M.D.   US Renal  10/09/2011  *RADIOLOGY REPORT*  Clinical Data: History of perinephric abscess.  RENAL/URINARY TRACT ULTRASOUND COMPLETE  Comparison:  CT abdomen and pelvis 10/05/2011 and renal ultrasound 10/06/2011.  Findings:  Right Kidney:  Measures 11.8 cm.  Fullness of the renal pelvis is unchanged.  No stone or mass.  Left Kidney:  Measures 11.9 cm.  Perinephric fluid collection off the upper pole with a fissure extending into the kidney compatible with perinephric abscess is again seen and unchanged in appearance. The collection measures approximately 2.0 x 0.6 centimeters.  Cyst measuring 2.1 cm in diameter in the upper pole is stable appearance. Very small left pleural effusion is noted.  Bladder:  Thickening and urinary bladder wall again seen.  IMPRESSION:  1.  No interval change in the appearance of perinephric fluid collection off the upper pole of the left kidney compatible with abscess. 2.  No change in wall thickening of the urinary bladder. 3.  Trace left pleural  effusion.  Original Report Authenticated By: Arvid Right. Luther Parody, M.D.   US Renal  10/06/2011  *RADIOLOGY REPORT*  Clinical Data: Poor and anemia.  Abnormal appearance of the left kidney on CT scan dated 10/05/2011  RENAL/URINARY TRACT ULTRASOUND COMPLETE  Comparison:  CT scan dated 10/05/2011  Findings:  Right Kidney:  11.9 cm in length. Slight prominence of the renal pelvis, unchanged since the prior CT scan.  Left Kidney:  The upper pole of the left kidney is abnormal with a hypoechoic fissure in the left upper pole extending through the cortex into the perinephric space with fluid and what appears to be echogenic debris within this perinephric fluid collection.  The finding is consistent with pyelonephritis with a small perinephric abscess.   There is a simple appearing 2.1 cm cyst in the midportion of the left kidney. Slight dilatation of the left renal pelvis.  Bladder:  The bladder wall is markedly thickened.  There is enlargement of the prostate gland.  IMPRESSION:  1.  Pyelonephritis of the upper pole of the left kidney with a fissure through the upper pole to the perinephric space which I feel was caused by the infection with pus and debris in the perinephric space superiorly. 2.  Slight prominence of the renal collecting systems  bilaterally, left greater than right, unchanged since 10/05/2011. 3. Thickening of the bladder wall which could be due to bladder outlet obstruction due to prostatomegaly but I am concerned the patient probably also has cystitis.  Original Report Authenticated By: Larey Seat, M.D.   US Venous Img Lower Bilateral  10/06/2011  *RADIOLOGY REPORT*  Clinical Data: Bilateral lower extremity edema, history diabetes  VENOUS DUPLEX ULTRASOUND OF BILATERAL LOWER EXTREMITIES  Technique:  Gray-scale sonography with graded compression, as well as color Doppler and duplex ultrasound, were performed to evaluate the deep venous system of both lower extremities from the level of the  common femoral vein through the popliteal and proximal calf veins.  Spectral Doppler was utilized to evaluate flow at rest and with distal augmentation maneuvers.  Comparison:  None  Findings: Deep venous systems appear patent and compressible from groin to popliteal fossa bilaterally.  Spontaneous venous flow present with intact augmentation and evidence of respiratory phasicity.  No intraluminal thrombus identified.  Flow within the right popliteal vein is slightly sluggish.  Visualized portions of the greater saphenous systems appear patent.  IMPRESSION: No evidence of deep venous thrombosis in the lower extremities.  Original Report Authenticated By: Burnetta Sabin, M.D.   Dg Chest Portable 1 View  10/01/2011  *RADIOLOGY REPORT*  Clinical Data: Fever.  PORTABLE CHEST - 1 VIEW  Comparison: None.  Findings: 1746 hours.  The heart size and mediastinal contours are normal for portable technique.  There are patchy left greater than right basilar opacities.  No definite edema, pleural effusion or pneumothorax is identified.  Multiple telemetry leads overlie the chest.  IMPRESSION: Patchy bibasilar opacities may represent atelectasis.  However, in this context, early aspiration pneumonia cannot be excluded. Radiographic followup recommended.  Original Report Authenticated By: Vivia Ewing, M.D.   Procedures: none  EKG: Sinus tachycardia  Full Code   Hospital Course: See H&P for complete admission details. The patient is a 65 year old black male with a history of diabetes who presented with fever. Apparently he has a history of mental retardation and is a poor historian. His temperature was above 103. He had clear lung sounds and otherwise unremarkable physical examination. His white blood cell count was 15,000. He had been chest x-ray which showed patchy bibasilar opacities. His urinalysis showed no nitrites and leukocyte esterase. He was started on antibiotics for empiric coverage of community-acquired  pneumonia. Upon further questioning however, he denied cough shortness of breath or chest pain. Blood cultures came back positive for group B streptococcus. He continued to spike fevers and antibiotic coverage was broadened. Eventually, he had a CT of the abdomen and pelvis which showed abnormalities of the left kidney. Ultrasound was consistent with pyelonephritis and perinephric abscess on the left. The abscess measured 2 cm. Urology was consulted. It was not felt that percutaneous drainage would hasten recovery. Repeat ultrasound showed no worsening, and essentially no change, but this was time only 2 days after the first ultrasound. I would not expect the abscess to have resolved within that space of time. Clinically, he is much improved so I'll treat him with antibiotics and have him followup with Dr. Michela Pitcher as an outpatient. Flu swab and HIV were negative.  On admission, his blood sugars were uncontrolled. He has a history of diabetes but is unable to check his blood sugars and rarely visits his position. It's unclear whether he was even taking metformin which was listed as medication on admission. He was started on Amaryl and insulin. His  metformin was started back. He did his cognitive problems, I D. not feel it safe at this time to send him home on insulin due to risk of hypoglycemia. I will ask the nurse to go out to the house to assist with medications and diabetes. I have increased his metformin dose and started Actos. The care manager will arrange followup with a primary care physician in the community. This will need to be monitored as an outpatient. His hemoglobin A1c was almost 14.  On admission, he was noted to be very anemic. With hydration, his hemoglobin dropped even further. He was transfused 2 units of packed red blood cells. He had no evidence of ongoing bleeding, but anemia workup was consistent with iron deficiency. He's never had endoscopy that I can tell. Hemoccult of the stools were  ordered but not resulted. He can have an outpatient workup with GI and I've placed him on iron supplementation. Total time on the day of discharge is greater than 30 minutes.  Discharge Exam:  Blood pressure 98/68, pulse 77, temperature 98.5 F (36.9 C), temperature source Oral, resp. rate 16, height 5\' 8"  (1.727 m), weight 86 kg (189 lb 9.5 oz), SpO2 99.00%.  Unchanged from 10/09/2011   Signed: Doree Barthel L 10/10/2011, 11:17 AM

## 2011-10-10 NOTE — Progress Notes (Signed)
Discharge instructions and prescriptions given, verbalized understanding, out in stable condition ambulatory with staff. 

## 2011-10-10 NOTE — Progress Notes (Signed)
   CARE MANAGEMENT NOTE 10/10/2011  Patient:  Zachary [name], Zachary [name]   Account Number:  [id number]  Date Initiated:  10/07/2011  Documentation initiated by:  Lance Coon Assessment:   65 yr old male  pneumonia lives at home with girlfriend. pt has no pcp. [facility]  md     Action/Plan:   Anticipated DC Date:  10/13/2011   Anticipated DC Plan:  [place]  CM consult      [facility] Choice  HOME HEALTH   Choice offered to / List presented to:  C-5 Sibling        HH arranged  HH-1 RN      [place].   Status of service:  Completed, signed off Medicare Important Message given?   (If response is "NO", the following Medicare IM given date fields will be blank) Date Medicare IM given:   Date Additional Medicare IM given:    Discharge Disposition:  [place]  Per UR Regulation:    Comments:  09/30/2011 Remo Lipps rn bsn  pt d/c home with girlfiend and family . spoke to pt and his brother richard [phone] concerning d/c plans . pt has no pcp appointment with dr Nolon Nations  on 12/19at 2;30and dr Freda Munro on dec 26 at 10:30. Richard states the family would make sure he gets to his appointments. Marland Kitchen arranged home health services with [facility] for diabetic management.  10/07/2011 Remo Lipps rn bsn pt continues to spike fevers. now with abscess of kidney going to be scheduled for drainage of abscess. d/c plan remains to return home at d/c

## 2011-10-11 DIAGNOSIS — N151 Renal and perinephric abscess: Secondary | ICD-10-CM | POA: Diagnosis not present

## 2011-10-11 DIAGNOSIS — IMO0001 Reserved for inherently not codable concepts without codable children: Secondary | ICD-10-CM | POA: Diagnosis not present

## 2011-10-11 DIAGNOSIS — B951 Streptococcus, group B, as the cause of diseases classified elsewhere: Secondary | ICD-10-CM | POA: Diagnosis not present

## 2011-10-11 DIAGNOSIS — N10 Acute tubulo-interstitial nephritis: Secondary | ICD-10-CM | POA: Diagnosis not present

## 2011-10-11 DIAGNOSIS — F79 Unspecified intellectual disabilities: Secondary | ICD-10-CM | POA: Diagnosis not present

## 2011-10-11 DIAGNOSIS — R7881 Bacteremia: Secondary | ICD-10-CM | POA: Diagnosis not present

## 2011-10-11 LAB — CULTURE, BLOOD (ROUTINE X 2)
Culture: NO GROWTH
Culture: NO GROWTH

## 2011-10-13 DIAGNOSIS — R7881 Bacteremia: Secondary | ICD-10-CM | POA: Diagnosis not present

## 2011-10-13 DIAGNOSIS — N151 Renal and perinephric abscess: Secondary | ICD-10-CM | POA: Diagnosis not present

## 2011-10-13 DIAGNOSIS — N10 Acute tubulo-interstitial nephritis: Secondary | ICD-10-CM | POA: Diagnosis not present

## 2011-10-13 DIAGNOSIS — B951 Streptococcus, group B, as the cause of diseases classified elsewhere: Secondary | ICD-10-CM | POA: Diagnosis not present

## 2011-10-13 DIAGNOSIS — F79 Unspecified intellectual disabilities: Secondary | ICD-10-CM | POA: Diagnosis not present

## 2011-10-13 DIAGNOSIS — IMO0001 Reserved for inherently not codable concepts without codable children: Secondary | ICD-10-CM | POA: Diagnosis not present

## 2011-10-17 DIAGNOSIS — IMO0001 Reserved for inherently not codable concepts without codable children: Secondary | ICD-10-CM | POA: Diagnosis not present

## 2011-10-17 DIAGNOSIS — N151 Renal and perinephric abscess: Secondary | ICD-10-CM | POA: Diagnosis not present

## 2011-10-17 DIAGNOSIS — N10 Acute tubulo-interstitial nephritis: Secondary | ICD-10-CM | POA: Diagnosis not present

## 2011-10-17 DIAGNOSIS — F79 Unspecified intellectual disabilities: Secondary | ICD-10-CM | POA: Diagnosis not present

## 2011-10-17 DIAGNOSIS — R7881 Bacteremia: Secondary | ICD-10-CM | POA: Diagnosis not present

## 2011-10-17 DIAGNOSIS — B951 Streptococcus, group B, as the cause of diseases classified elsewhere: Secondary | ICD-10-CM | POA: Diagnosis not present

## 2011-10-19 DIAGNOSIS — N10 Acute tubulo-interstitial nephritis: Secondary | ICD-10-CM | POA: Diagnosis not present

## 2011-10-19 DIAGNOSIS — F79 Unspecified intellectual disabilities: Secondary | ICD-10-CM | POA: Diagnosis not present

## 2011-10-19 DIAGNOSIS — IMO0001 Reserved for inherently not codable concepts without codable children: Secondary | ICD-10-CM | POA: Diagnosis not present

## 2011-10-19 DIAGNOSIS — B951 Streptococcus, group B, as the cause of diseases classified elsewhere: Secondary | ICD-10-CM | POA: Diagnosis not present

## 2011-10-19 DIAGNOSIS — R7881 Bacteremia: Secondary | ICD-10-CM | POA: Diagnosis not present

## 2011-10-19 DIAGNOSIS — N151 Renal and perinephric abscess: Secondary | ICD-10-CM | POA: Diagnosis not present

## 2011-10-22 DIAGNOSIS — R7881 Bacteremia: Secondary | ICD-10-CM | POA: Diagnosis not present

## 2011-10-22 DIAGNOSIS — N10 Acute tubulo-interstitial nephritis: Secondary | ICD-10-CM | POA: Diagnosis not present

## 2011-10-22 DIAGNOSIS — IMO0001 Reserved for inherently not codable concepts without codable children: Secondary | ICD-10-CM | POA: Diagnosis not present

## 2011-10-22 DIAGNOSIS — B951 Streptococcus, group B, as the cause of diseases classified elsewhere: Secondary | ICD-10-CM | POA: Diagnosis not present

## 2011-10-22 DIAGNOSIS — N151 Renal and perinephric abscess: Secondary | ICD-10-CM | POA: Diagnosis not present

## 2011-10-22 DIAGNOSIS — F79 Unspecified intellectual disabilities: Secondary | ICD-10-CM | POA: Diagnosis not present

## 2011-10-29 DIAGNOSIS — F79 Unspecified intellectual disabilities: Secondary | ICD-10-CM | POA: Diagnosis not present

## 2011-10-29 DIAGNOSIS — B951 Streptococcus, group B, as the cause of diseases classified elsewhere: Secondary | ICD-10-CM | POA: Diagnosis not present

## 2011-10-29 DIAGNOSIS — IMO0001 Reserved for inherently not codable concepts without codable children: Secondary | ICD-10-CM | POA: Diagnosis not present

## 2011-10-29 DIAGNOSIS — N10 Acute tubulo-interstitial nephritis: Secondary | ICD-10-CM | POA: Diagnosis not present

## 2011-10-29 DIAGNOSIS — R7881 Bacteremia: Secondary | ICD-10-CM | POA: Diagnosis not present

## 2011-10-29 DIAGNOSIS — N151 Renal and perinephric abscess: Secondary | ICD-10-CM | POA: Diagnosis not present

## 2011-11-07 ENCOUNTER — Other Ambulatory Visit: Payer: Self-pay | Admitting: Internal Medicine

## 2011-11-25 DIAGNOSIS — IMO0001 Reserved for inherently not codable concepts without codable children: Secondary | ICD-10-CM | POA: Diagnosis not present

## 2011-11-25 DIAGNOSIS — F79 Unspecified intellectual disabilities: Secondary | ICD-10-CM | POA: Diagnosis not present

## 2011-11-25 DIAGNOSIS — E119 Type 2 diabetes mellitus without complications: Secondary | ICD-10-CM | POA: Diagnosis not present

## 2011-11-25 DIAGNOSIS — D649 Anemia, unspecified: Secondary | ICD-10-CM | POA: Diagnosis not present

## 2011-11-27 DIAGNOSIS — E119 Type 2 diabetes mellitus without complications: Secondary | ICD-10-CM | POA: Diagnosis not present

## 2011-12-17 DIAGNOSIS — IMO0001 Reserved for inherently not codable concepts without codable children: Secondary | ICD-10-CM | POA: Diagnosis not present

## 2011-12-17 DIAGNOSIS — E785 Hyperlipidemia, unspecified: Secondary | ICD-10-CM | POA: Diagnosis not present

## 2011-12-17 DIAGNOSIS — E559 Vitamin D deficiency, unspecified: Secondary | ICD-10-CM | POA: Diagnosis not present

## 2011-12-24 DIAGNOSIS — E785 Hyperlipidemia, unspecified: Secondary | ICD-10-CM | POA: Diagnosis not present

## 2011-12-24 DIAGNOSIS — E039 Hypothyroidism, unspecified: Secondary | ICD-10-CM | POA: Diagnosis not present

## 2011-12-24 DIAGNOSIS — E559 Vitamin D deficiency, unspecified: Secondary | ICD-10-CM | POA: Diagnosis not present

## 2011-12-24 DIAGNOSIS — E1129 Type 2 diabetes mellitus with other diabetic kidney complication: Secondary | ICD-10-CM | POA: Diagnosis not present

## 2012-02-17 DIAGNOSIS — E119 Type 2 diabetes mellitus without complications: Secondary | ICD-10-CM | POA: Diagnosis not present

## 2012-02-17 DIAGNOSIS — D649 Anemia, unspecified: Secondary | ICD-10-CM | POA: Diagnosis not present

## 2012-02-17 DIAGNOSIS — E568 Deficiency of other vitamins: Secondary | ICD-10-CM | POA: Diagnosis not present

## 2012-02-17 DIAGNOSIS — F79 Unspecified intellectual disabilities: Secondary | ICD-10-CM | POA: Diagnosis not present

## 2012-04-09 DIAGNOSIS — E785 Hyperlipidemia, unspecified: Secondary | ICD-10-CM | POA: Diagnosis not present

## 2012-04-09 DIAGNOSIS — I10 Essential (primary) hypertension: Secondary | ICD-10-CM | POA: Diagnosis not present

## 2012-04-09 DIAGNOSIS — IMO0001 Reserved for inherently not codable concepts without codable children: Secondary | ICD-10-CM | POA: Diagnosis not present

## 2012-04-09 DIAGNOSIS — E559 Vitamin D deficiency, unspecified: Secondary | ICD-10-CM | POA: Diagnosis not present

## 2012-04-27 DIAGNOSIS — E669 Obesity, unspecified: Secondary | ICD-10-CM | POA: Diagnosis not present

## 2012-04-27 DIAGNOSIS — IMO0001 Reserved for inherently not codable concepts without codable children: Secondary | ICD-10-CM | POA: Diagnosis not present

## 2012-04-27 DIAGNOSIS — E785 Hyperlipidemia, unspecified: Secondary | ICD-10-CM | POA: Diagnosis not present

## 2012-04-27 DIAGNOSIS — E559 Vitamin D deficiency, unspecified: Secondary | ICD-10-CM | POA: Diagnosis not present

## 2012-04-27 DIAGNOSIS — E038 Other specified hypothyroidism: Secondary | ICD-10-CM | POA: Diagnosis not present

## 2012-05-27 DIAGNOSIS — E785 Hyperlipidemia, unspecified: Secondary | ICD-10-CM | POA: Diagnosis not present

## 2012-05-27 DIAGNOSIS — E039 Hypothyroidism, unspecified: Secondary | ICD-10-CM | POA: Diagnosis not present

## 2012-05-27 DIAGNOSIS — F79 Unspecified intellectual disabilities: Secondary | ICD-10-CM | POA: Diagnosis not present

## 2012-05-27 DIAGNOSIS — IMO0001 Reserved for inherently not codable concepts without codable children: Secondary | ICD-10-CM | POA: Diagnosis not present

## 2012-07-20 DIAGNOSIS — E785 Hyperlipidemia, unspecified: Secondary | ICD-10-CM | POA: Diagnosis not present

## 2012-07-20 DIAGNOSIS — IMO0001 Reserved for inherently not codable concepts without codable children: Secondary | ICD-10-CM | POA: Diagnosis not present

## 2012-07-20 DIAGNOSIS — E559 Vitamin D deficiency, unspecified: Secondary | ICD-10-CM | POA: Diagnosis not present

## 2012-07-27 DIAGNOSIS — E039 Hypothyroidism, unspecified: Secondary | ICD-10-CM | POA: Diagnosis not present

## 2012-07-27 DIAGNOSIS — E669 Obesity, unspecified: Secondary | ICD-10-CM | POA: Diagnosis not present

## 2012-07-27 DIAGNOSIS — E785 Hyperlipidemia, unspecified: Secondary | ICD-10-CM | POA: Diagnosis not present

## 2012-07-27 DIAGNOSIS — IMO0001 Reserved for inherently not codable concepts without codable children: Secondary | ICD-10-CM | POA: Diagnosis not present

## 2012-10-21 DIAGNOSIS — E559 Vitamin D deficiency, unspecified: Secondary | ICD-10-CM | POA: Diagnosis not present

## 2012-10-21 DIAGNOSIS — IMO0001 Reserved for inherently not codable concepts without codable children: Secondary | ICD-10-CM | POA: Diagnosis not present

## 2012-10-21 DIAGNOSIS — E039 Hypothyroidism, unspecified: Secondary | ICD-10-CM | POA: Diagnosis not present

## 2013-03-28 DIAGNOSIS — E039 Hypothyroidism, unspecified: Secondary | ICD-10-CM | POA: Diagnosis not present

## 2013-03-28 DIAGNOSIS — D649 Anemia, unspecified: Secondary | ICD-10-CM | POA: Diagnosis not present

## 2013-03-28 DIAGNOSIS — E119 Type 2 diabetes mellitus without complications: Secondary | ICD-10-CM | POA: Diagnosis not present

## 2013-03-28 DIAGNOSIS — F79 Unspecified intellectual disabilities: Secondary | ICD-10-CM | POA: Diagnosis not present

## 2013-12-25 ENCOUNTER — Encounter (HOSPITAL_COMMUNITY): Payer: Self-pay | Admitting: Emergency Medicine

## 2013-12-25 ENCOUNTER — Emergency Department (HOSPITAL_COMMUNITY): Payer: Medicare Other

## 2013-12-25 ENCOUNTER — Inpatient Hospital Stay (HOSPITAL_COMMUNITY)
Admission: EM | Admit: 2013-12-25 | Discharge: 2014-01-07 | DRG: 666 | Disposition: A | Discharge status: Home / Self Care | Payer: Medicare Other | Attending: Internal Medicine | Admitting: Internal Medicine

## 2013-12-25 DIAGNOSIS — N1330 Unspecified hydronephrosis: Secondary | ICD-10-CM | POA: Diagnosis not present

## 2013-12-25 DIAGNOSIS — N478 Other disorders of prepuce: Secondary | ICD-10-CM | POA: Diagnosis present

## 2013-12-25 DIAGNOSIS — N118 Other chronic tubulo-interstitial nephritis: Secondary | ICD-10-CM | POA: Diagnosis not present

## 2013-12-25 DIAGNOSIS — R7309 Other abnormal glucose: Secondary | ICD-10-CM | POA: Diagnosis not present

## 2013-12-25 DIAGNOSIS — J40 Bronchitis, not specified as acute or chronic: Secondary | ICD-10-CM | POA: Diagnosis present

## 2013-12-25 DIAGNOSIS — F72 Severe intellectual disabilities: Secondary | ICD-10-CM | POA: Diagnosis present

## 2013-12-25 DIAGNOSIS — N400 Enlarged prostate without lower urinary tract symptoms: Secondary | ICD-10-CM | POA: Diagnosis present

## 2013-12-25 DIAGNOSIS — N289 Disorder of kidney and ureter, unspecified: Secondary | ICD-10-CM | POA: Diagnosis present

## 2013-12-25 DIAGNOSIS — F79 Unspecified intellectual disabilities: Secondary | ICD-10-CM | POA: Diagnosis present

## 2013-12-25 DIAGNOSIS — J209 Acute bronchitis, unspecified: Secondary | ICD-10-CM | POA: Diagnosis not present

## 2013-12-25 DIAGNOSIS — N183 Chronic kidney disease, stage 3 unspecified: Secondary | ICD-10-CM | POA: Diagnosis present

## 2013-12-25 DIAGNOSIS — IMO0002 Reserved for concepts with insufficient information to code with codable children: Secondary | ICD-10-CM

## 2013-12-25 DIAGNOSIS — N471 Phimosis: Secondary | ICD-10-CM | POA: Diagnosis present

## 2013-12-25 DIAGNOSIS — R339 Retention of urine, unspecified: Secondary | ICD-10-CM | POA: Diagnosis not present

## 2013-12-25 DIAGNOSIS — IMO0001 Reserved for inherently not codable concepts without codable children: Secondary | ICD-10-CM | POA: Diagnosis not present

## 2013-12-25 DIAGNOSIS — E860 Dehydration: Secondary | ICD-10-CM | POA: Diagnosis present

## 2013-12-25 DIAGNOSIS — J029 Acute pharyngitis, unspecified: Secondary | ICD-10-CM | POA: Diagnosis present

## 2013-12-25 DIAGNOSIS — N320 Bladder-neck obstruction: Secondary | ICD-10-CM | POA: Diagnosis present

## 2013-12-25 DIAGNOSIS — N401 Enlarged prostate with lower urinary tract symptoms: Secondary | ICD-10-CM | POA: Diagnosis not present

## 2013-12-25 DIAGNOSIS — N179 Acute kidney failure, unspecified: Secondary | ICD-10-CM | POA: Diagnosis not present

## 2013-12-25 DIAGNOSIS — N151 Renal and perinephric abscess: Secondary | ICD-10-CM | POA: Diagnosis not present

## 2013-12-25 DIAGNOSIS — Z794 Long term (current) use of insulin: Secondary | ICD-10-CM | POA: Diagnosis not present

## 2013-12-25 DIAGNOSIS — N35919 Unspecified urethral stricture, male, unspecified site: Secondary | ICD-10-CM | POA: Diagnosis present

## 2013-12-25 DIAGNOSIS — I129 Hypertensive chronic kidney disease with stage 1 through stage 4 chronic kidney disease, or unspecified chronic kidney disease: Secondary | ICD-10-CM | POA: Diagnosis present

## 2013-12-25 DIAGNOSIS — N19 Unspecified kidney failure: Secondary | ICD-10-CM | POA: Diagnosis not present

## 2013-12-25 DIAGNOSIS — R059 Cough, unspecified: Secondary | ICD-10-CM | POA: Diagnosis not present

## 2013-12-25 DIAGNOSIS — N2581 Secondary hyperparathyroidism of renal origin: Secondary | ICD-10-CM | POA: Diagnosis not present

## 2013-12-25 DIAGNOSIS — D290 Benign neoplasm of penis: Secondary | ICD-10-CM | POA: Diagnosis not present

## 2013-12-25 DIAGNOSIS — I10 Essential (primary) hypertension: Secondary | ICD-10-CM | POA: Diagnosis not present

## 2013-12-25 DIAGNOSIS — N138 Other obstructive and reflux uropathy: Secondary | ICD-10-CM | POA: Diagnosis present

## 2013-12-25 DIAGNOSIS — R05 Cough: Secondary | ICD-10-CM | POA: Diagnosis not present

## 2013-12-25 DIAGNOSIS — E119 Type 2 diabetes mellitus without complications: Secondary | ICD-10-CM | POA: Diagnosis not present

## 2013-12-25 DIAGNOSIS — D509 Iron deficiency anemia, unspecified: Secondary | ICD-10-CM | POA: Diagnosis present

## 2013-12-25 DIAGNOSIS — N4289 Other specified disorders of prostate: Secondary | ICD-10-CM | POA: Diagnosis not present

## 2013-12-25 DIAGNOSIS — E1129 Type 2 diabetes mellitus with other diabetic kidney complication: Secondary | ICD-10-CM | POA: Diagnosis not present

## 2013-12-25 DIAGNOSIS — D649 Anemia, unspecified: Secondary | ICD-10-CM | POA: Diagnosis not present

## 2013-12-25 DIAGNOSIS — R739 Hyperglycemia, unspecified: Secondary | ICD-10-CM

## 2013-12-25 DIAGNOSIS — E1165 Type 2 diabetes mellitus with hyperglycemia: Secondary | ICD-10-CM

## 2013-12-25 LAB — CBC WITH DIFFERENTIAL/PLATELET
Basophils Absolute: 0 10*3/uL (ref 0.0–0.1)
Basophils Relative: 0 % (ref 0–1)
Eosinophils Absolute: 0 10*3/uL (ref 0.0–0.7)
Eosinophils Relative: 1 % (ref 0–5)
HCT: 31.6 % — ABNORMAL LOW (ref 39.0–52.0)
Hemoglobin: 11 g/dL — ABNORMAL LOW (ref 13.0–17.0)
Lymphocytes Relative: 27 % (ref 12–46)
Lymphs Abs: 0.9 10*3/uL (ref 0.7–4.0)
MCH: 32.8 pg (ref 26.0–34.0)
MCHC: 34.8 g/dL (ref 30.0–36.0)
MCV: 94.3 fL (ref 78.0–100.0)
Monocytes Absolute: 0.5 10*3/uL (ref 0.1–1.0)
Monocytes Relative: 16 % — ABNORMAL HIGH (ref 3–12)
Neutro Abs: 1.8 10*3/uL (ref 1.7–7.7)
Neutrophils Relative %: 55 % (ref 43–77)
Platelets: 175 10*3/uL (ref 150–400)
RBC: 3.35 MIL/uL — ABNORMAL LOW (ref 4.22–5.81)
RDW: 12.8 % (ref 11.5–15.5)
WBC: 3.3 10*3/uL — ABNORMAL LOW (ref 4.0–10.5)

## 2013-12-25 LAB — I-STAT CHEM 8, ED
BUN: 51 mg/dL — ABNORMAL HIGH (ref 6–23)
Calcium, Ion: 1.17 mmol/L (ref 1.13–1.30)
Chloride: 102 mEq/L (ref 96–112)
Creatinine, Ser: 3.3 mg/dL — ABNORMAL HIGH (ref 0.50–1.35)
Glucose, Bld: 401 mg/dL — ABNORMAL HIGH (ref 70–99)
HCT: 34 % — ABNORMAL LOW (ref 39.0–52.0)
Hemoglobin: 11.6 g/dL — ABNORMAL LOW (ref 13.0–17.0)
Potassium: 5.3 mEq/L (ref 3.7–5.3)
Sodium: 134 mEq/L — ABNORMAL LOW (ref 137–147)
TCO2: 24 mmol/L (ref 0–100)

## 2013-12-25 LAB — CBG MONITORING, ED
Glucose-Capillary: 373 mg/dL — ABNORMAL HIGH (ref 70–99)
Glucose-Capillary: 328 mg/dL — ABNORMAL HIGH (ref 70–99)

## 2013-12-25 LAB — CREATININE, SERUM
Creatinine, Ser: 3.23 mg/dL — ABNORMAL HIGH (ref 0.50–1.35)
GFR calc Af Amer: 22 mL/min — ABNORMAL LOW (ref >90)
GFR calc non Af Amer: 19 mL/min — ABNORMAL LOW (ref >90)

## 2013-12-25 MED ORDER — SODIUM CHLORIDE 0.9 % IV BOLUS (SEPSIS)
1000.0000 mL | Freq: Once | INTRAVENOUS | Status: AC
  Administered 2013-12-25: 1000 mL via INTRAVENOUS

## 2013-12-25 MED ORDER — INSULIN ASPART 100 UNIT/ML ~~LOC~~ SOLN
0.0000 [IU] | Freq: Three times a day (TID) | SUBCUTANEOUS | Status: DC
  Administered 2013-12-27: 3 [IU] via SUBCUTANEOUS
  Administered 2013-12-27 – 2013-12-28 (×2): 2 [IU] via SUBCUTANEOUS
  Administered 2013-12-28 – 2013-12-29 (×2): 3 [IU] via SUBCUTANEOUS
  Administered 2013-12-30 – 2014-01-06 (×12): 2 [IU] via SUBCUTANEOUS
  Administered 2014-01-07: 3 [IU] via SUBCUTANEOUS

## 2013-12-25 MED ORDER — NIACIN ER (ANTIHYPERLIPIDEMIC) 1000 MG PO TBCR: 1000.0000 mg | EXTENDED_RELEASE_TABLET | Freq: Every day | ORAL | Status: DC

## 2013-12-25 MED ORDER — ENOXAPARIN SODIUM 30 MG/0.3ML ~~LOC~~ SOLN
30.0000 mg | SUBCUTANEOUS | Status: DC
  Administered 2013-12-25: 30 mg via SUBCUTANEOUS
  Filled 2013-12-25: qty 0.3

## 2013-12-25 MED ORDER — SODIUM CHLORIDE 0.9 % IV SOLN
Freq: Once | INTRAVENOUS | Status: AC
  Administered 2013-12-25: 1000 mL via INTRAVENOUS

## 2013-12-25 MED ORDER — ACETAMINOPHEN 325 MG PO TABS: 650.0000 mg | ORAL_TABLET | Freq: Four times a day (QID) | ORAL | Status: DC | PRN

## 2013-12-25 MED ORDER — ALBUTEROL SULFATE (2.5 MG/3ML) 0.083% IN NEBU: 5.0000 mg | INHALATION_SOLUTION | RESPIRATORY_TRACT | Status: AC | PRN

## 2013-12-25 MED ORDER — SODIUM CHLORIDE 0.9 % IV SOLN
INTRAVENOUS | Status: DC
  Filled 2013-12-25: qty 1

## 2013-12-25 MED ORDER — INSULIN ASPART PROT & ASPART (70-30 MIX) 100 UNIT/ML ~~LOC~~ SUSP
40.0000 [IU] | Freq: Two times a day (BID) | SUBCUTANEOUS | Status: DC
  Administered 2013-12-25 – 2013-12-26 (×2): 40 [IU] via SUBCUTANEOUS
  Filled 2013-12-25: qty 10

## 2013-12-25 MED ORDER — AZITHROMYCIN 250 MG PO TABS
500.0000 mg | ORAL_TABLET | Freq: Once | ORAL | Status: AC
  Administered 2013-12-25: 500 mg via ORAL
  Filled 2013-12-25: qty 2

## 2013-12-25 MED ORDER — ONDANSETRON HCL 4 MG/2ML IJ SOLN
4.0000 mg | Freq: Four times a day (QID) | INTRAMUSCULAR | Status: DC | PRN
Start: 2013-12-25 — End: 2014-01-07

## 2013-12-25 MED ORDER — ONDANSETRON HCL 4 MG PO TABS: 4.0000 mg | ORAL_TABLET | Freq: Four times a day (QID) | ORAL | Status: DC | PRN

## 2013-12-25 MED ORDER — INSULIN ASPART 100 UNIT/ML IV SOLN
5.0000 [IU] | Freq: Once | INTRAVENOUS | Status: AC
  Administered 2013-12-25: 5 [IU] via INTRAVENOUS

## 2013-12-25 MED ORDER — SODIUM CHLORIDE 0.9 % IV SOLN: INTRAVENOUS | Status: DC

## 2013-12-25 MED ORDER — NIACIN ER (ANTIHYPERLIPIDEMIC) 500 MG PO TBCR
1000.0000 mg | EXTENDED_RELEASE_TABLET | Freq: Every day | ORAL | Status: DC
  Administered 2013-12-25 – 2014-01-06 (×13): 1000 mg via ORAL
  Filled 2013-12-25 (×15): qty 2

## 2013-12-25 MED ORDER — NIACIN ER 250 MG PO CPCR
ORAL_CAPSULE | ORAL | Status: AC
  Filled 2013-12-25: qty 4

## 2013-12-25 MED ORDER — ACETAMINOPHEN 650 MG RE SUPP: 650.0000 mg | Freq: Four times a day (QID) | RECTAL | Status: DC | PRN

## 2013-12-25 MED ORDER — INFLUENZA VAC SPLIT QUAD 0.5 ML IM SUSP
0.5000 mL | INTRAMUSCULAR | Status: AC
  Administered 2013-12-26: 0.5 mL via INTRAMUSCULAR
  Filled 2013-12-25: qty 0.5

## 2013-12-25 MED ORDER — INSULIN ASPART PROT & ASPART (70-30 MIX) 100 UNIT/ML ~~LOC~~ SUSP
SUBCUTANEOUS | Status: AC
  Filled 2013-12-25: qty 10

## 2013-12-25 MED ORDER — LEVOTHYROXINE SODIUM 75 MCG PO TABS
75.0000 ug | ORAL_TABLET | Freq: Every day | ORAL | Status: DC
  Administered 2013-12-26 – 2014-01-07 (×13): 75 ug via ORAL
  Filled 2013-12-25 (×13): qty 1

## 2013-12-25 MED ORDER — SIMVASTATIN 20 MG PO TABS
40.0000 mg | ORAL_TABLET | Freq: Every day | ORAL | Status: DC
  Administered 2013-12-25 – 2014-01-07 (×14): 40 mg via ORAL
  Filled 2013-12-25 (×14): qty 2

## 2013-12-25 MED ORDER — INSULIN ASPART 100 UNIT/ML ~~LOC~~ SOLN
0.0000 [IU] | Freq: Every day | SUBCUTANEOUS | Status: DC
  Administered 2013-12-25 – 2014-01-04 (×2): 2 [IU] via SUBCUTANEOUS

## 2013-12-25 NOTE — ED Notes (Signed)
Pt states non-productive cough x 2 weeks with possible fever at times, stating he is unsure. NAD. Strong odor of Kerosine noted.

## 2013-12-25 NOTE — ED Notes (Signed)
C/o cough off and on for past month, unknown fever-c/o chills at times, ? Blood sugar-requesting blood sugar to be checked

## 2013-12-25 NOTE — ED Provider Notes (Signed)
CSN: [id number]     Arrival date & time 12/25/13  1515 History  This chart was scribed for Janice Norrie, MD by Roxan Diesel, ED scribe.  This patient was seen in room APA06/APA06 and the patient's care was started at 4:01 PM.   Chief Complaint  Patient presents with  . Cough  . Hyperglycemia    The history is provided by the patient. No language interpreter was used.    HPI Comments: Zachary [name] is a 68 y.o. male who presents to the Emergency Department complaining of a persistent "bad cold" that began 2 weeks ago.  Pt describes his symptoms as non-productive cough with associated mild sore throat.  He denies rhinorrhea, fever, vomiting, diarrhea, SOB, or wheezing.  Pt also admits to polyuria and polydipsia recently.  He takes DM medication regularly and denies recent missed doses.  Pt thinks his PCP is Dr. Moshe Cipro but he is not sure.  He does not smoke or drink.  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  PCP ? Unknown possible Tula Nakayama  Past Medical History  Diagnosis Date  . Hypertension   . Diabetes mellitus   . Smoking   . Abnormal CT scan, kidney 10/06/2011  . UTI (lower urinary tract infection) 10/06/2011  . Bladder wall thickening 10/06/2011  . Acute pyelonephritis 10/07/2011  . Perinephric abscess 10/07/2011    History reviewed. No pertinent past surgical history.  History reviewed. No pertinent family history.   History  Substance Use Topics  . Smoking status: Never Smoker   . Smokeless tobacco: Never Used  . Alcohol Use: Yes     Comment: occ. use    retired from [employer]  Review of Systems  Constitutional: Negative for fever.  HENT: Positive for sore throat. Negative for rhinorrhea.   Respiratory: Positive for cough. Negative for shortness of breath and wheezing.   Gastrointestinal: Negative for vomiting and diarrhea.  Endocrine: Positive for polydipsia and polyuria.  All other systems reviewed and are negative.      Allergies   Review of patient's allergies indicates no known allergies.  Home Medications   Current Outpatient Rx  Name  Route  Sig  Dispense  Refill  . EXPIRED: ferrous sulfate 325 (65 FE) MG EC tablet   Oral   Take 1 tablet (325 mg total) by mouth daily with breakfast.   30 tablet   0   . EXPIRED: glimepiride (AMARYL) 4 MG tablet   Oral   Take 1 tablet (4 mg total) by mouth daily with breakfast.   30 tablet   0   . metFORMIN (GLUCOPHAGE) 1000 MG tablet   Oral   Take 1 tablet (1,000 mg total) by mouth 2 (two) times daily.   60 tablet   0   . EXPIRED: pioglitazone (ACTOS) 30 MG tablet   Oral   Take 1 tablet (30 mg total) by mouth daily.   30 tablet   0    BP 136/79  Pulse 106  Temp(Src) 98.2 F (36.8 C) (Oral)  Resp 18  Ht 5\' 9"  (1.753 m)  Wt 179 lb (81.194 kg)  BMI 26.42 kg/m2  SpO2 100%  Vital signs normal except tachycardia   Physical Exam  Nursing note and vitals reviewed. Constitutional: He is oriented to person, place, and time. He appears well-developed and well-nourished.  Non-toxic appearance. He does not appear ill. No distress.  HENT:  Head: Normocephalic and atraumatic.  Right Ear: External ear normal.  Left Ear: External ear  normal.  Nose: Nose normal. No mucosal edema or rhinorrhea.  Mouth/Throat: Oropharynx is clear and moist and mucous membranes are normal. No dental abscesses or uvula swelling.  Poor dentition  Eyes: Conjunctivae and EOM are normal. Pupils are equal, round, and reactive to light.  Neck: Normal range of motion and full passive range of motion without pain. Neck supple.  Cardiovascular: Normal rate, regular rhythm and normal heart sounds.  Exam reveals no gallop and no friction rub.   No murmur heard. Pulmonary/Chest: Effort normal and breath sounds normal. No respiratory distress. He has no wheezes. He has no rhonchi. He has no rales. He exhibits no tenderness and no crepitus.  Abdominal: Soft. Normal appearance and bowel sounds are  normal. He exhibits no distension. There is no tenderness. There is no rebound and no guarding.  Musculoskeletal: Normal range of motion. He exhibits no edema and no tenderness.  Moves all extremities well.   Neurological: He is alert and oriented to person, place, and time. He has normal strength. No cranial nerve deficit.  Seems mentally slow  Skin: Skin is warm, dry and intact. No rash noted. No erythema. No pallor.  Psychiatric: He has a normal mood and affect. His speech is normal and behavior is normal. His mood appears not anxious.    ED Course  Procedures (including critical care time)  Medications  sodium chloride 0.9 % bolus 1,000 mL (0 mLs Intravenous Stopped 12/25/13 1811)  0.9 %  sodium chloride infusion (1,000 mLs Intravenous New Bag/Given 12/25/13 1817)  insulin aspart (novoLOG) injection 5 Units (5 Units Intravenous Given 12/25/13 1817)     DIAGNOSTIC STUDIES: Oxygen Saturation is 100% on room air, normal by my interpretation.    COORDINATION OF CARE: 4:08 PM-Discussed treatment plan which includes CXR and labs with pt at bedside and pt agreed to plan.   Pt given IV fluids and his CBG improved from 401 to 328 with IV fluids. Insulin drip changed to one dose of IV insulin.   [name] sister-in-law is here who is a former CNA. She has a folder with all the patient's medical information in it and she also brought his pill bottles which shows his primary care doctor is Dr. Legrand Rams and he also sees Dr Dorris Fetch for his diabetes. She is unaware of any prior problem with his kidneys. She states that she tries to manage his medications for him.  Patient started on Zithromax for his bronchitis.  18:14 Dr Roderic Palau admit to med-surgery, Dr Josephine Cables service  Labs Review Results for orders placed during the hospital encounter of 12/25/13  CBC WITH DIFFERENTIAL      Result Value Ref Range   WBC 3.3 (*) 4.0 - 10.5 K/uL   RBC 3.35 (*) 4.22 - 5.81 MIL/uL   Hemoglobin 11.0 (*) 13.0 - 17.0  g/dL   HCT 31.6 (*) 39.0 - 52.0 %   MCV 94.3  78.0 - 100.0 fL   MCH 32.8  26.0 - 34.0 pg   MCHC 34.8  30.0 - 36.0 g/dL   RDW 12.8  11.5 - 15.5 %   Platelets 175  150 - 400 K/uL   Neutrophils Relative % 55  43 - 77 %   Neutro Abs 1.8  1.7 - 7.7 K/uL   Lymphocytes Relative 27  12 - 46 %   Lymphs Abs 0.9  0.7 - 4.0 K/uL   Monocytes Relative 16 (*) 3 - 12 %   Monocytes Absolute 0.5  0.1 - 1.0 K/uL  Eosinophils Relative 1  0 - 5 %   Eosinophils Absolute 0.0  0.0 - 0.7 K/uL   Basophils Relative 0  0 - 1 %   Basophils Absolute 0.0  0.0 - 0.1 K/uL  CBG MONITORING, ED      Result Value Ref Range   Glucose-Capillary 373 (*) 70 - 99 mg/dL  I-STAT CHEM 8, ED      Result Value Ref Range   Sodium 134 (*) 137 - 147 mEq/L   Potassium 5.3  3.7 - 5.3 mEq/L   Chloride 102  96 - 112 mEq/L   BUN 51 (*) 6 - 23 mg/dL   Creatinine, Ser 3.30 (*) 0.50 - 1.35 mg/dL   Glucose, Bld 401 (*) 70 - 99 mg/dL   Calcium, Ion 1.17  1.13 - 1.30 mmol/L   TCO2 24  0 - 100 mmol/L   Hemoglobin 11.6 (*) 13.0 - 17.0 g/dL   HCT 34.0 (*) 39.0 - 52.0 %  CBG MONITORING, ED      Result Value Ref Range   Glucose-Capillary 328 (*) 70 - 99 mg/dL   Laboratory interpretation all normal except hyperglycemia, significant renal insufficiency that was not present in 2012.     Imaging Review Dg Chest 2 View  12/25/2013   CLINICAL DATA:  Chronic cough, congestion  EXAM: CHEST  2 VIEW  COMPARISON:  10/03/2011  FINDINGS: Cardiomediastinal silhouette is stable. No acute infiltrate or pleural effusion. No pulmonary edema. Bony thorax is unremarkable.  IMPRESSION: No active cardiopulmonary disease.   Electronically Signed   By: Lahoma Crocker M.D.   On: 12/25/2013 17:11     EKG Interpretation None      MDM   Final diagnoses:  Renal insufficiency  Bronchitis  Hyperglycemia    Plan admission   Rolland Porter, MD, FACEP   I personally performed the services described in this documentation, which was scribed in my presence. The  recorded information has been reviewed and considered.  Rolland Porter, MD, Abram Sander    Janice Norrie, MD 12/25/13 [phone]

## 2013-12-25 NOTE — ED Notes (Signed)
Ate last 1120 today

## 2013-12-25 NOTE — H&P (Signed)
[facility] History and Physical  GAINS DUCKSWORTH [id number] DOB: 14-Apr-1946 DOA: 12/25/2013  Referring physician: Dr. Tomi Bamberger, ER physician PCP: [name], MD   Chief Complaint: Cough  HPI: TARAS UTT is a 68 y.o. male with history of mental retardation, insulin-dependent diabetes. The patient comes to the hospital today with complaints of having a cough and sore throat for the last 2 weeks. He has been taking over-the-counter cough medicine/lozenges. It is unclear whether he's had any fevers. He's not had any vomiting or diarrhea. He reports that his urine has been red. His family reports that he has stopped eating and drinking for the last 4-5 days. Details of the patient's presenting illness are poor. Basic lab work was checked in the emergency room where he was found to have a creatinine of 3.3 and BUN of 51. He was also noted to be hyperglycemic with glucose of 41. The patient did not have an anion gap on admission. Chest x-ray which start which did not show any acute abnormalities. Admission is requested for further workup of renal failure.    Review of Systems:  Limited due to patient's mental status. Pertinent positives as per history of present illness, otherwise negative  Past Medical History  Diagnosis Date  . Hypertension   . Diabetes mellitus   . Smoking   . Abnormal CT scan, kidney 10/06/2011  . UTI (lower urinary tract infection) 10/06/2011  . Bladder wall thickening 10/06/2011  . Acute pyelonephritis 10/07/2011  . Perinephric abscess 10/07/2011   History reviewed. No pertinent past surgical history. Social History:  reports that he has never smoked. He has never used smokeless tobacco. He reports that he drinks alcohol. He reports that he does not use illicit drugs.  No Known Allergies  History reviewed. No pertinent family history.   Prior to Admission medications   Medication Sig Start Date End Date Taking? Authorizing Provider  insulin aspart  protamine- aspart (NOVOLOG MIX 70/30) (70-30) 100 UNIT/ML injection Inject 40 Units into the skin 2 (two) times daily.   Yes Historical Provider, MD  levothyroxine (SYNTHROID, LEVOTHROID) 75 MCG tablet Take 75 mcg by mouth daily.   Yes Historical Provider, MD  linagliptin (TRADJENTA) 5 MG TABS tablet Take 5 mg by mouth daily.   Yes Historical Provider, MD  niacin (NIASPAN) 1000 MG CR tablet Take 1,000 mg by mouth at bedtime.   Yes Historical Provider, MD  simvastatin (ZOCOR) 40 MG tablet Take 40 mg by mouth daily.   Yes Historical Provider, MD   Physical Exam: Filed Vitals:   12/25/13 1810  BP: 138/85  Pulse: 89  Temp:   Resp: 16    BP 138/85  Pulse 89  Temp(Src) 98.2 F (36.8 C) (Oral)  Resp 16  Ht 5\' 9"  (1.753 m)  Wt 81.194 kg (179 lb)  BMI 26.42 kg/m2  SpO2 99%  General:  Appears calm and comfortable Eyes: PERRL, normal lids, irises & conjunctiva ENT: mucous membranes are dry Neck: no LAD, masses or thyromegaly Cardiovascular: RRR, no m/r/g. No LE edema. Telemetry: SR, no arrhythmias  Respiratory: CTA bilaterally, no w/r/r. Normal respiratory effort. Abdomen: soft, ntnd Skin: no rash or induration seen on limited exam Musculoskeletal: grossly normal tone BUE/BLE Psychiatric: grossly normal mood and affect, speech fluent and appropriate Neurologic: grossly non-focal.          Labs on Admission:  Basic Metabolic Panel:  Recent Labs Lab 12/25/13 1657  NA 134*  K 5.3  CL 102  GLUCOSE 401*  BUN  51*  CREATININE 3.30*   Liver Function Tests: No results found for this basename: AST, ALT, ALKPHOS, BILITOT, PROT, ALBUMIN,  in the last 168 hours No results found for this basename: LIPASE, AMYLASE,  in the last 168 hours No results found for this basename: AMMONIA,  in the last 168 hours CBC:  Recent Labs Lab 12/25/13 1619 12/25/13 1657  WBC 3.3*  --   NEUTROABS 1.8  --   HGB 11.0* 11.6*  HCT 31.6* 34.0*  MCV 94.3  --   PLT 175  --    Cardiac Enzymes: No  results found for this basename: CKTOTAL, CKMB, CKMBINDEX, TROPONINI,  in the last 168 hours  BNP (last 3 results) No results found for this basename: PROBNP,  in the last 8760 hours CBG:  Recent Labs Lab 12/25/13 1526 12/25/13 1755  GLUCAP 373* 328*    Radiological Exams on Admission: Dg Chest 2 View  12/25/2013   CLINICAL DATA:  Chronic cough, congestion  EXAM: CHEST  2 VIEW  COMPARISON:  10/03/2011  FINDINGS: Cardiomediastinal silhouette is stable. No acute infiltrate or pleural effusion. No pulmonary edema. Bony thorax is unremarkable.  IMPRESSION: No active cardiopulmonary disease.   Electronically Signed   By: Lahoma Crocker M.D.   On: 12/25/2013 17:11   Assessment/Plan Active Problems:   DM (diabetes mellitus), type 2, uncontrolled   Mental retardation   Acute renal failure   Renal insufficiency   Dehydration   1. Acute renal failure. Unclear what the patient's baseline renal function is. Prior lab values in her system are from 2012 were renal function was noted to be normal. Will check urinalysis and renal ultrasound. Patient will be hydrated since this may be related to dehydration. With his history of sore throat for the last 2 weeks, we'll check C3/C4/antistreptolysin O. titers to evaluate for post streptococcal glomerulonephritis. We'll request a nephrology consultation for assistance. 2. Uncontrolled diabetes. Will restart the patient on his NovoLog 70/30 insulin. Supplemental sliding-scale. This will need to be further adjusted. He does not appear to have an anion gap at this time. 3. Dehydration. UA fluids. 4. Cough and sore throat. We'll check rapid strep. Continue the patient on azithromycin which was started in the emergency room.  Code Status: full code Family Communication: discussed with patient and family Disposition Plan: discharge home once improved  Time spent: 16mins  MEMON,JEHANZEB [facility] Pager [phone]

## 2013-12-25 NOTE — ED Notes (Signed)
Insulin drip was not sent up from pharmacy. Notified AC of need.

## 2013-12-26 ENCOUNTER — Observation Stay (HOSPITAL_COMMUNITY): Payer: Medicare Other

## 2013-12-26 LAB — GLUCOSE, CAPILLARY
Glucose-Capillary: 218 mg/dL — ABNORMAL HIGH (ref 70–99)
Glucose-Capillary: 71 mg/dL (ref 70–99)
Glucose-Capillary: 84 mg/dL (ref 70–99)
Glucose-Capillary: 105 mg/dL — ABNORMAL HIGH (ref 70–99)
Glucose-Capillary: 73 mg/dL (ref 70–99)

## 2013-12-26 LAB — URINALYSIS, ROUTINE W REFLEX MICROSCOPIC
Bilirubin Urine: NEGATIVE
Glucose, UA: 250 mg/dL — AB
Ketones, ur: NEGATIVE mg/dL
Nitrite: NEGATIVE
Protein, ur: 100 mg/dL — AB
Specific Gravity, Urine: 1.02 (ref 1.005–1.030)
Urobilinogen, UA: 0.2 mg/dL (ref 0.0–1.0)
pH: 5.5 (ref 5.0–8.0)

## 2013-12-26 LAB — CBC
HCT: 26.5 % — ABNORMAL LOW (ref 39.0–52.0)
Hemoglobin: 9.2 g/dL — ABNORMAL LOW (ref 13.0–17.0)
MCH: 32.3 pg (ref 26.0–34.0)
MCHC: 34.7 g/dL (ref 30.0–36.0)
MCV: 93 fL (ref 78.0–100.0)
Platelets: 148 10*3/uL — ABNORMAL LOW (ref 150–400)
RBC: 2.85 MIL/uL — ABNORMAL LOW (ref 4.22–5.81)
RDW: 12.8 % (ref 11.5–15.5)
WBC: 3.2 10*3/uL — ABNORMAL LOW (ref 4.0–10.5)

## 2013-12-26 LAB — BASIC METABOLIC PANEL
BUN: 32 mg/dL — ABNORMAL HIGH (ref 6–23)
CO2: 21 mEq/L (ref 19–32)
Calcium: 8.3 mg/dL — ABNORMAL LOW (ref 8.4–10.5)
Chloride: 104 mEq/L (ref 96–112)
Creatinine, Ser: 2.35 mg/dL — ABNORMAL HIGH (ref 0.50–1.35)
GFR calc Af Amer: 32 mL/min — ABNORMAL LOW (ref >90)
GFR calc non Af Amer: 27 mL/min — ABNORMAL LOW (ref >90)
Glucose, Bld: 80 mg/dL (ref 70–99)
Potassium: 4.3 mEq/L (ref 3.7–5.3)
Sodium: 136 mEq/L — ABNORMAL LOW (ref 137–147)

## 2013-12-26 LAB — URINE MICROSCOPIC-ADD ON

## 2013-12-26 LAB — RAPID STREP SCREEN (MED CTR MEBANE ONLY): Streptococcus, Group A Screen (Direct): NEGATIVE

## 2013-12-26 MED ORDER — SODIUM CHLORIDE 0.9 % IV SOLN
INTRAVENOUS | Status: DC
  Administered 2013-12-26 – 2014-01-05 (×19): via INTRAVENOUS

## 2013-12-26 MED ORDER — ENOXAPARIN SODIUM 40 MG/0.4ML ~~LOC~~ SOLN
40.0000 mg | SUBCUTANEOUS | Status: DC
  Administered 2013-12-26 – 2014-01-06 (×10): 40 mg via SUBCUTANEOUS
  Filled 2013-12-26 (×10): qty 0.4

## 2013-12-26 MED ORDER — AZITHROMYCIN 250 MG PO TABS
250.0000 mg | ORAL_TABLET | Freq: Every day | ORAL | Status: DC
  Administered 2013-12-26 – 2014-01-01 (×7): 250 mg via ORAL
  Filled 2013-12-26 (×7): qty 1

## 2013-12-26 MED ORDER — INSULIN ASPART PROT & ASPART (70-30 MIX) 100 UNIT/ML ~~LOC~~ SUSP
40.0000 [IU] | Freq: Two times a day (BID) | SUBCUTANEOUS | Status: DC
  Administered 2013-12-26 – 2013-12-28 (×3): 40 [IU] via SUBCUTANEOUS
  Filled 2013-12-26: qty 10

## 2013-12-26 NOTE — Consult Note (Signed)
Reason for Consult: Acute kidney injury Referring Physician: [name] is an 68 y.o. male.  HPI: He is a patient who has history of hypertension, diabetes, and previous history of pyelonephritis presently came with complaints of cough and chills off a couple of days duration. Patient says that the cough is dry and nonproductive. He denies any difficulty breathing. He denies also any nausea vomiting. When he was evaluated patient was found to elevated BUN and creatinine and hypoglycemia hence admitted to the hospital. Patient denies any previous history of renal failure and a history of kidney stone. Patient also denies any history of proteinuria or swelling of the legs.  Past Medical History  Diagnosis Date  . Hypertension   . Diabetes mellitus   . Smoking   . Abnormal CT scan, kidney 10/06/2011  . UTI (lower urinary tract infection) 10/06/2011  . Bladder wall thickening 10/06/2011  . Acute pyelonephritis 10/07/2011  . Perinephric abscess 10/07/2011    History reviewed. No pertinent past surgical history.  History reviewed. No pertinent family history.  Social History:  reports that he has never smoked. He has never used smokeless tobacco. He reports that he drinks alcohol. He reports that he does not use illicit drugs.  Allergies: No Known Allergies  Medications: I have reviewed the patient's current medications.  Results for orders placed during the hospital encounter of 12/25/13 (from the past 48 hour(s))  CBG MONITORING, ED     Status: Abnormal   Collection Time    12/25/13  3:26 PM      Result Value Ref Range   Glucose-Capillary 373 (*) 70 - 99 mg/dL  CBC WITH DIFFERENTIAL     Status: Abnormal   Collection Time    12/25/13  4:19 PM      Result Value Ref Range   WBC 3.3 (*) 4.0 - 10.5 K/uL   RBC 3.35 (*) 4.22 - 5.81 MIL/uL   Hemoglobin 11.0 (*) 13.0 - 17.0 g/dL   HCT 31.6 (*) 39.0 - 52.0 %   MCV 94.3  78.0 - 100.0 fL   MCH 32.8  26.0 - 34.0 pg   MCHC  34.8  30.0 - 36.0 g/dL   RDW 12.8  11.5 - 15.5 %   Platelets 175  150 - 400 K/uL   Neutrophils Relative % 55  43 - 77 %   Neutro Abs 1.8  1.7 - 7.7 K/uL   Lymphocytes Relative 27  12 - 46 %   Lymphs Abs 0.9  0.7 - 4.0 K/uL   Monocytes Relative 16 (*) 3 - 12 %   Monocytes Absolute 0.5  0.1 - 1.0 K/uL   Eosinophils Relative 1  0 - 5 %   Eosinophils Absolute 0.0  0.0 - 0.7 K/uL   Basophils Relative 0  0 - 1 %   Basophils Absolute 0.0  0.0 - 0.1 K/uL  CREATININE, SERUM     Status: Abnormal   Collection Time    12/25/13  4:19 PM      Result Value Ref Range   Creatinine, Ser 3.23 (*) 0.50 - 1.35 mg/dL   GFR calc non Af Amer 19 (*) >90 mL/min   GFR calc Af Amer 22 (*) >90 mL/min   Comment: (NOTE)     The eGFR has been calculated using the CKD EPI equation.     This calculation has not been validated in all clinical situations.     eGFR's persistently <90 mL/min signify possible Chronic Kidney  Disease.  I-STAT CHEM 8, ED     Status: Abnormal   Collection Time    12/25/13  4:57 PM      Result Value Ref Range   Sodium 134 (*) 137 - 147 mEq/L   Potassium 5.3  3.7 - 5.3 mEq/L   Chloride 102  96 - 112 mEq/L   BUN 51 (*) 6 - 23 mg/dL   Creatinine, Ser 3.30 (*) 0.50 - 1.35 mg/dL   Glucose, Bld 401 (*) 70 - 99 mg/dL   Calcium, Ion 1.17  1.13 - 1.30 mmol/L   TCO2 24  0 - 100 mmol/L   Hemoglobin 11.6 (*) 13.0 - 17.0 g/dL   HCT 34.0 (*) 39.0 - 52.0 %  CBG MONITORING, ED     Status: Abnormal   Collection Time    12/25/13  5:55 PM      Result Value Ref Range   Glucose-Capillary 328 (*) 70 - 99 mg/dL  GLUCOSE, CAPILLARY     Status: Abnormal   Collection Time    12/25/13  8:41 PM      Result Value Ref Range   Glucose-Capillary 218 (*) 70 - 99 mg/dL   Comment 1 Documented in Chart     Comment 2 Notify RN    RAPID STREP SCREEN     Status: None   Collection Time    12/26/13  2:39 AM      Result Value Ref Range   Streptococcus, Group A Screen (Direct) NEGATIVE  NEGATIVE   Comment:  (NOTE)     A Rapid Antigen test may result negative if the antigen level in the     sample is below the detection level of this test. The FDA has not     cleared this test as a stand-alone test therefore the rapid antigen     negative result has reflexed to a Group A Strep culture.  BASIC METABOLIC PANEL     Status: Abnormal   Collection Time    12/26/13  5:44 AM      Result Value Ref Range   Sodium 136 (*) 137 - 147 mEq/L   Potassium 4.3  3.7 - 5.3 mEq/L   Comment: DELTA CHECK NOTED   Chloride 104  96 - 112 mEq/L   CO2 21  19 - 32 mEq/L   Glucose, Bld 80  70 - 99 mg/dL   BUN 32 (*) 6 - 23 mg/dL   Creatinine, Ser 2.35 (*) 0.50 - 1.35 mg/dL   Calcium 8.3 (*) 8.4 - 10.5 mg/dL   GFR calc non Af Amer 27 (*) >90 mL/min   GFR calc Af Amer 32 (*) >90 mL/min   Comment: (NOTE)     The eGFR has been calculated using the CKD EPI equation.     This calculation has not been validated in all clinical situations.     eGFR's persistently <90 mL/min signify possible Chronic Kidney     Disease.  CBC     Status: Abnormal   Collection Time    12/26/13  5:44 AM      Result Value Ref Range   WBC 3.2 (*) 4.0 - 10.5 K/uL   RBC 2.85 (*) 4.22 - 5.81 MIL/uL   Hemoglobin 9.2 (*) 13.0 - 17.0 g/dL   Comment: DELTA CHECK NOTED     RESULT REPEATED AND VERIFIED   HCT 26.5 (*) 39.0 - 52.0 %   MCV 93.0  78.0 - 100.0 fL   MCH 32.3  26.0 -  34.0 pg   MCHC 34.7  30.0 - 36.0 g/dL   RDW 12.8  11.5 - 15.5 %   Platelets 148 (*) 150 - 400 K/uL  GLUCOSE, CAPILLARY     Status: None   Collection Time    12/26/13  7:18 AM      Result Value Ref Range   Glucose-Capillary 71  70 - 99 mg/dL    Dg Chest 2 View  12/25/2013   CLINICAL DATA:  Chronic cough, congestion  EXAM: CHEST  2 VIEW  COMPARISON:  10/03/2011  FINDINGS: Cardiomediastinal silhouette is stable. No acute infiltrate or pleural effusion. No pulmonary edema. Bony thorax is unremarkable.  IMPRESSION: No active cardiopulmonary disease.   Electronically Signed    By: Zachary [name] M.D.   On: 12/25/2013 17:11   US Renal  12/26/2013   CLINICAL DATA:  Acute renal failure  EXAM: RENAL/URINARY TRACT ULTRASOUND COMPLETE  COMPARISON:  US RENAL dated 10/09/2011; CT ABD/PELVIS W CM dated 10/05/2011  FINDINGS: Right Kidney:  Length: 11.7 cm. There has been interval development of moderate hydronephrosis when compared to the previous study. There is no evidence of solid nor cystic masses, no evidence of cortical thinning. There does appear to be appropriate corticomedullary differentiation though there is slight increased echogenicity of the renal cortex. There is mild to moderate hydroureter within the visualized proximal right ureter.  Left Kidney:  Length: 11.6 cm. Moderate hydronephrosis is appreciated within the left kidney which is also developed in the interim. A stable 2 x 2.5 x 2 6 cm benign-appearing cyst is appreciated within the left kidney. Hydroureter is appreciated within the visualized portion of the left ureter. Corticomedullary differentiation is preserved with no there is mild increased echogenicity of the renal cortex. The previous described perinephric fluid collection is not clearly identified on the present study. There does appear to be an area of focal cortical thinning in this region consistent with an area of scarring.  Bladder:  The urinary bladder demonstrates mild trabeculation. A left ureteral jet is appreciated. A right ureteral jet is not clearly identified an equivocal finding. The prostate is enlarged with a volume of 52 cc.  IMPRESSION: 1. Moderate bilateral hydronephrosis. There is also evidence of hydroureter bilaterally. No obstructing masses nor appreciable calculi identified. Clinical correlation recommended and if clinically warranted further evaluation with renal protocol CT is recommended. 2. Trabeculated bladder wall and enlarged prostate. These findings may reflect sequela of a component of bladder outlet obstruction. Clinical correlation  recommended.   Electronically Signed   By: Margaree Mackintosh M.D.   On: 12/26/2013 09:56    Review of Systems  Constitutional: Positive for chills. Negative for fever.  HENT: Positive for congestion.   Respiratory: Positive for cough and sputum production. Negative for shortness of breath.   Cardiovascular: Negative for orthopnea, leg swelling and PND.  Gastrointestinal: Negative for nausea, vomiting and diarrhea.  Genitourinary: Negative for dysuria and urgency.   Blood pressure 120/81, pulse 96, temperature 98 F (36.7 C), temperature source Oral, resp. rate 20, height '5\' 9"'  (1.753 m), weight 82.2 kg (181 lb 3.5 oz), SpO2 100.00%. Physical Exam  Constitutional: No distress.  Eyes: No scleral icterus.  Neck: No JVD present.  Cardiovascular: Normal rate and regular rhythm.   Respiratory: No respiratory distress. He has no wheezes. He has no rales.  GI: He exhibits no distension. There is no tenderness. There is no rebound.  Musculoskeletal: He exhibits no edema.    Assessment/Plan: Problem #1 acute kidney injury  possibly superimposed on chronic renal failure. Presently his BUN and creatinine seems to be improving. Patient ultrasound showed bilateral moderate hydronephrosis with hydro-ureter. Patient also has an enlarged prostate hence we may be dealing with obstructive uropathy superimposed on chronic renal failure. Presently patient is a symptomatic. Problem #2 chronic renal failure. His creatinine 10/08/2011 was 1.17. Her exact time patient was found also to have left-sided pyelonephritis with perinephric abscess. He is working with underlying focal segmental nephrosclerosis related to the above. At this moment contribution of diabetes and hypertension cannot ruled out. Problem #3 history of diabetes Problem #5 history of hypertension Problem #6 anemia: Etiology as this moment not clear but slightly secondary to iron deficiency anemia however anemia of chronic disease need to be also  considered. Problem #7 history of enlarged prostate Problem #8 history of mild mental sedation. Recommendation:   we'll increase her IV fluid to to 125 cc per hour. We'll put Foley catheter. We'll do iron studies in the morning. We'll check his present metabolic panel, intact PTH, vitamin D and phosphorus in the morning. Patient may benefit from urology consult.  Katheryn Culliton S 12/26/2013, 11:25 AM

## 2013-12-26 NOTE — Progress Notes (Signed)
UR chart review completed.  

## 2013-12-26 NOTE — Care Management Note (Unsigned)
    Page 1 of 1   01/05/2014     3:50:24 PM   CARE MANAGEMENT NOTE 01/05/2014  Patient:  ISON, GAIDA   Account Number:  [id number]  Date Initiated:  12/26/2013  Documentation initiated by:  Theophilus Kinds  Subjective/Objective Assessment:   Pt admitted from home with AKI. Pt lives with his girlfriend. Pt is fairly independent with ADL's. Pts sister in law comes into the home daily to assist pt with care as well.     Action/Plan:   No CM needs noted at this time.   Anticipated DC Date:  01/05/2014   Anticipated DC Plan:  [facility]  CM consult      Choice offered to / List presented to:             Status of service:  In process, will continue to follow Medicare Important Message given?   (If response is "NO", the following Medicare IM given date fields will be blank) Date Medicare IM given:   Date Additional Medicare IM given:    Discharge Disposition:  HOME/SELF CARE  Per UR Regulation:    If discussed at Long Length of Stay Meetings, dates discussed:    Comments:  01/05/14 1500 Vladimir Creeks RN/CM Pt had Circumcision and TURP 01/04/14. Should D/C home in AM. May need HH if any problem voiding at D/C  12/26/13 1430 Christinia Gully, Therapist, sports BSN CM

## 2013-12-26 NOTE — Progress Notes (Signed)
Subjective: Patient was admitted yesterday due acute renal failure. He has also cough and congestion.However, chest x-ray was negative.   Objective: Vital signs in last 24 hours: Temp:  [98 F (36.7 C)-98.2 F (36.8 C)] 98 F (36.7 C) (03/02 0443) Pulse Rate:  [89-106] 96 (03/02 0443) Resp:  [16-20] 20 (03/02 0443) BP: (120-159)/(79-99) 120/81 mmHg (03/02 0443) SpO2:  [97 %-100 %] 100 % (03/02 0443) Weight:  [80.6 kg (177 lb 11.1 oz)-82.2 kg (181 lb 3.5 oz)] 82.2 kg (181 lb 3.5 oz) (03/02 0443) Weight change:  Last BM Date: 12/24/13  Intake/Output from previous day:    PHYSICAL EXAM General appearance: alert and no distress Resp: diminished breath sounds bilaterally and rhonchi bilaterally Cardio: S1, S2 normal GI: soft, non-tender; bowel sounds normal; no masses,  no organomegaly Extremities: extremities normal, atraumatic, no cyanosis or edema  Lab Results:    @labtest @ [facility]  Recent Labs  12/25/13 1657  TCO2 24   CULTURES Recent Results (from the past 240 hour(s))  RAPID STREP SCREEN     Status: None   Collection Time    12/26/13  2:39 AM      Result Value Ref Range Status   Streptococcus, Group A Screen (Direct) NEGATIVE  NEGATIVE Final   Comment: (NOTE)     A Rapid Antigen test may result negative if the antigen level in the     sample is below the detection level of this test. The FDA has not     cleared this test as a stand-alone test therefore the rapid antigen     negative result has reflexed to a Group A Strep culture.   Studies/Results: Dg Chest 2 View  12/25/2013   CLINICAL DATA:  Chronic cough, congestion  EXAM: CHEST  2 VIEW  COMPARISON:  10/03/2011  FINDINGS: Cardiomediastinal silhouette is stable. No acute infiltrate or pleural effusion. No pulmonary edema. Bony thorax is unremarkable.  IMPRESSION: No active cardiopulmonary disease.   Electronically Signed   By: Lahoma Crocker M.D.   On: 12/25/2013 17:11    Medications: I have reviewed the patient's  current medications.  Assesment: Active Problems:   DM (diabetes mellitus), type 2, uncontrolled   Mental retardation   Acute renal failure   Renal insufficiency   Dehydration    Plan: Medications reviewed Continue Iv rehydration Will monitor [facility] Nephrology consult.     LOS: 1 day   Zachary [name] 12/26/2013, 7:53 AM

## 2013-12-26 NOTE — Progress Notes (Signed)
Inpatient Diabetes Program Recommendations  AACE/ADA: New Consensus Statement on Inpatient Glycemic Control (2013)  Target Ranges:  Prepandial:   less than 140 mg/dL      Peak postprandial:   less than 180 mg/dL (1-2 hours)      Critically ill patients:  140 - 180 mg/dL   Reason for Assessment: Results for KADEEM, MCKISSIC (MRN [id number]) as of 12/26/2013 10:45  Ref. Range 12/25/2013 15:26 12/25/2013 17:55 12/25/2013 20:41 12/26/2013 07:18  Glucose-Capillary Latest Range: 70-99 mg/dL 373 (H) 328 (H) 218 (H) 71   Diabetes history: Type 2 diabetes Outpatient Diabetes medications: Novolog mix 70/30-40 units bid Current orders for Inpatient glycemic control: Novolog 70/30 units bid, Novolog moderate tid with meals and HS  Sent message to pharmacy regarding timing of Novolog 70/30-Should be given with meals.  May also consider A1C to determine pre-hospitalization glycemic control.  Thanks, Adah Perl, RN, BC-ADM Inpatient Diabetes Coordinator Pager [phone]

## 2013-12-26 NOTE — Progress Notes (Signed)
Order placed for foley catheter per Dr. Lowanda Foster. Pt refused foley catheter. Explained to patient why foley was ordered. Pt states "I've had a bad experience before".  Unsure if patient completely understands explanation due to developmental delay. Bladder scan performed and revealed 140 cc's. Pt does not have the urge to void at this time. Dr. Lowanda Foster paged and made aware of the above. No new orders at this time. Will continue to monitor.

## 2013-12-27 LAB — BASIC METABOLIC PANEL
BUN: 26 mg/dL — ABNORMAL HIGH (ref 6–23)
CO2: 22 mEq/L (ref 19–32)
Calcium: 8.5 mg/dL (ref 8.4–10.5)
Chloride: 111 mEq/L (ref 96–112)
Creatinine, Ser: 1.95 mg/dL — ABNORMAL HIGH (ref 0.50–1.35)
GFR calc Af Amer: 40 mL/min — ABNORMAL LOW (ref >90)
GFR calc non Af Amer: 34 mL/min — ABNORMAL LOW (ref >90)
Glucose, Bld: 54 mg/dL — ABNORMAL LOW (ref 70–99)
Potassium: 4.3 mEq/L (ref 3.7–5.3)
Sodium: 145 mEq/L (ref 137–147)

## 2013-12-27 LAB — PTH, INTACT AND CALCIUM
Calcium, Total (PTH): 8.3 mg/dL — ABNORMAL LOW (ref 8.4–10.5)
PTH: 78.7 pg/mL — ABNORMAL HIGH (ref 14.0–72.0)

## 2013-12-27 LAB — IRON AND TIBC
Iron: 13 ug/dL — ABNORMAL LOW (ref 42–135)
Saturation Ratios: 6 % — ABNORMAL LOW (ref 20–55)
TIBC: 220 ug/dL (ref 215–435)
UIBC: 207 ug/dL (ref 125–400)

## 2013-12-27 LAB — GLUCOSE, CAPILLARY
Glucose-Capillary: 150 mg/dL — ABNORMAL HIGH (ref 70–99)
Glucose-Capillary: 152 mg/dL — ABNORMAL HIGH (ref 70–99)
Glucose-Capillary: 63 mg/dL — ABNORMAL LOW (ref 70–99)
Glucose-Capillary: 67 mg/dL — ABNORMAL LOW (ref 70–99)
Glucose-Capillary: 90 mg/dL (ref 70–99)
Glucose-Capillary: 91 mg/dL (ref 70–99)
Glucose-Capillary: 95 mg/dL (ref 70–99)

## 2013-12-27 LAB — CBC
HCT: 27.4 % — ABNORMAL LOW (ref 39.0–52.0)
Hemoglobin: 9.3 g/dL — ABNORMAL LOW (ref 13.0–17.0)
MCH: 32 pg (ref 26.0–34.0)
MCHC: 33.9 g/dL (ref 30.0–36.0)
MCV: 94.2 fL (ref 78.0–100.0)
Platelets: 164 10*3/uL (ref 150–400)
RBC: 2.91 MIL/uL — ABNORMAL LOW (ref 4.22–5.81)
RDW: 12.8 % (ref 11.5–15.5)
WBC: 4 10*3/uL (ref 4.0–10.5)

## 2013-12-27 LAB — FERRITIN: Ferritin: 59 ng/mL (ref 22–322)

## 2013-12-27 LAB — ANTISTREPTOLYSIN O TITER: ASO: 258 IU/mL (ref <409)

## 2013-12-27 LAB — PHOSPHORUS: Phosphorus: 3.6 mg/dL (ref 2.3–4.6)

## 2013-12-27 LAB — C3 COMPLEMENT: C3 Complement: 174 mg/dL (ref 90–180)

## 2013-12-27 LAB — C4 COMPLEMENT: Complement C4, Body Fluid: 33 mg/dL (ref 10–40)

## 2013-12-27 MED ORDER — TAMSULOSIN HCL 0.4 MG PO CAPS
0.4000 mg | ORAL_CAPSULE | Freq: Every day | ORAL | Status: DC
  Administered 2013-12-27 – 2014-01-07 (×12): 0.4 mg via ORAL
  Filled 2013-12-27 (×12): qty 1

## 2013-12-27 NOTE — Progress Notes (Signed)
Subjective: Patient feels better. His renal ultrasound showed bilateral hydronephrosis.   Objective: Vital signs in last 24 hours: Temp:  [97.6 F (36.4 C)-98.5 F (36.9 C)] 97.6 F (36.4 C) (03/03 0441) Pulse Rate:  [79-98] 79 (03/03 0441) Resp:  [18-20] 18 (03/03 0441) BP: (128-143)/(80-81) 143/80 mmHg (03/03 0441) SpO2:  [97 %-99 %] 99 % (03/03 0441) Weight change:  Last BM Date: 12/26/13  Intake/Output from previous day: 03/02 0701 - 03/03 0700 In: 2714.2 [P.O.:360; I.V.:2354.2] Out: 1100 [Urine:1100]  PHYSICAL EXAM General appearance: alert and no distress Resp: diminished breath sounds bilaterally and rhonchi bilaterally Cardio: S1, S2 normal GI: soft, non-tender; bowel sounds normal; no masses,  no organomegaly Extremities: extremities normal, atraumatic, no cyanosis or edema  Lab Results:    @labtest @ [facility]  Recent Labs  12/25/13 1657  TCO2 24   CULTURES Recent Results (from the past 240 hour(s))  RAPID STREP SCREEN     Status: None   Collection Time    12/26/13  2:39 AM      Result Value Ref Range Status   Streptococcus, Group A Screen (Direct) NEGATIVE  NEGATIVE Final   Comment: (NOTE)     A Rapid Antigen test may result negative if the antigen level in the     sample is below the detection level of this test. The FDA has not     cleared this test as a stand-alone test therefore the rapid antigen     negative result has reflexed to a Group A Strep culture.   Studies/Results: Dg Chest 2 View  12/25/2013   CLINICAL DATA:  Chronic cough, congestion  EXAM: CHEST  2 VIEW  COMPARISON:  10/03/2011  FINDINGS: Cardiomediastinal silhouette is stable. No acute infiltrate or pleural effusion. No pulmonary edema. Bony thorax is unremarkable.  IMPRESSION: No active cardiopulmonary disease.   Electronically Signed   By: Lahoma Crocker M.D.   On: 12/25/2013 17:11   US Renal  12/26/2013   CLINICAL DATA:  Acute renal failure  EXAM: RENAL/URINARY TRACT ULTRASOUND COMPLETE   COMPARISON:  US RENAL dated 10/09/2011; CT ABD/PELVIS W CM dated 10/05/2011  FINDINGS: Right Kidney:  Length: 11.7 cm. There has been interval development of moderate hydronephrosis when compared to the previous study. There is no evidence of solid nor cystic masses, no evidence of cortical thinning. There does appear to be appropriate corticomedullary differentiation though there is slight increased echogenicity of the renal cortex. There is mild to moderate hydroureter within the visualized proximal right ureter.  Left Kidney:  Length: 11.6 cm. Moderate hydronephrosis is appreciated within the left kidney which is also developed in the interim. A stable 2 x 2.5 x 2 6 cm benign-appearing cyst is appreciated within the left kidney. Hydroureter is appreciated within the visualized portion of the left ureter. Corticomedullary differentiation is preserved with no there is mild increased echogenicity of the renal cortex. The previous described perinephric fluid collection is not clearly identified on the present study. There does appear to be an area of focal cortical thinning in this region consistent with an area of scarring.  Bladder:  The urinary bladder demonstrates mild trabeculation. A left ureteral jet is appreciated. A right ureteral jet is not clearly identified an equivocal finding. The prostate is enlarged with a volume of 52 cc.  IMPRESSION: 1. Moderate bilateral hydronephrosis. There is also evidence of hydroureter bilaterally. No obstructing masses nor appreciable calculi identified. Clinical correlation recommended and if clinically warranted further evaluation with renal protocol CT is recommended.  2. Trabeculated bladder wall and enlarged prostate. These findings may reflect sequela of a component of bladder outlet obstruction. Clinical correlation recommended.   Electronically Signed   By: Margaree Mackintosh M.D.   On: 12/26/2013 09:56    Medications: I have reviewed the patient's current  medications.  Assesment: Active Problems:   DM (diabetes mellitus), type 2, uncontrolled   Mental retardation   Acute renal failure   Renal insufficiency   Dehydration    Plan: Medications reviewed Continue Iv rehydration Will monitor BMP Nephrology consult appreciated Will do urology consult.     LOS: 2 days   [name] 12/27/2013, 8:10 AM

## 2013-12-27 NOTE — Progress Notes (Signed)
Hypoglycemic Event  CBG: 63  Treatment: 15 GM carbohydrate snack  Symptoms: None  Follow-up CBG: 21:20 CBG Result:91  Possible Reasons for Event: Unknown  Comments/MD notified: blood sugar within normal limits with 15 grams of carbs.  Will continue to monitor patient.     Shantell Belongia R  Remember to initiate Hypoglycemia Order Set & complete

## 2013-12-27 NOTE — Progress Notes (Signed)
Discussed patients blood sugar with Dr. Legrand Rams. Will hold 70/30 this morning.

## 2013-12-27 NOTE — Progress Notes (Signed)
Inpatient Diabetes Program Recommendations  AACE/ADA: New Consensus Statement on Inpatient Glycemic Control (2013)  Target Ranges:  Prepandial:   less than 140 mg/dL      Peak postprandial:   less than 180 mg/dL (1-2 hours)      Critically ill patients:  140 - 180 mg/dL   Results for [name], Zachary [name] (MRN [id number]) as of 12/27/2013 10:25  Ref. Range 12/26/2013 07:18 12/26/2013 11:20 12/26/2013 16:45 12/26/2013 21:04 12/27/2013 00:11 12/27/2013 07:34 12/27/2013 08:39  Glucose-Capillary Latest Range: 70-99 mg/dL 71 84 105 (H) 73 90 67 (L) 95   Diabetes history: DM2 Outpatient Diabetes medications: 70/30 40 units BID Current orders for Inpatient glycemic control: 70/30 40 units BID, Novolog 0-15 units AC, Novolog 0-5 units HS  Inpatient Diabetes Program Recommendations Insulin - Basal: Please consider decreasing 70/30 to 35 units BID. HgbA1C: Please order an A1C to evaluate glycemic control over the past 2-3 months.  Thanks, Barnie Alderman, RN, MSN, CCRN Diabetes Coordinator Inpatient Diabetes Program [phone] (Team Pager) [phone] ([facility] office) [phone] [facility] office)

## 2013-12-27 NOTE — Plan of Care (Signed)
H/o renal failure now creatnine is improving has bilateral hydroureteronephrosiswill schedule bilateral retrogradesdenies any voiding difficulty is mentally retarded

## 2013-12-27 NOTE — Progress Notes (Signed)
Subjective: Interval History: has no complaint of nausea or vomiting. Presently patient denies any difficulty in breathing. Patient overall feels better..  Objective: Vital signs in last 24 hours: Temp:  [97.6 F (36.4 C)-98.5 F (36.9 C)] 97.6 F (36.4 C) (03/03 0441) Pulse Rate:  [79-98] 79 (03/03 0441) Resp:  [18-20] 18 (03/03 0441) BP: (128-143)/(80-81) 143/80 mmHg (03/03 0441) SpO2:  [97 %-99 %] 99 % (03/03 0441) Weight change:   Intake/Output from previous day: 03/02 0701 - 03/03 0700 In: 2714.2 [P.O.:360; I.V.:2354.2] Out: 1100 [Urine:1100] Intake/Output this shift:    General appearance: alert, cooperative and mild distress Resp: clear to auscultation bilaterally Cardio: regular rate and rhythm, S1, S2 normal, no murmur, click, rub or gallop GI: soft, non-tender; bowel sounds normal; no masses,  no organomegaly Extremities: extremities normal, atraumatic, no cyanosis or edema  Lab Results:  Recent Labs  12/26/13 0544 12/27/13 0506  WBC 3.2* 4.0  HGB 9.2* 9.3*  HCT 26.5* 27.4*  PLT 148* 164   BMET:  Recent Labs  12/26/13 0544 12/27/13 0506  NA 136* 145  K 4.3 4.3  CL 104 111  CO2 21 22  GLUCOSE 80 54*  BUN 32* 26*  CREATININE 2.35* 1.95*  CALCIUM 8.3* 8.5   No results found for this basename: PTH,  in the last 72 hours Iron Studies: No results found for this basename: IRON, TIBC, TRANSFERRIN, FERRITIN,  in the last 72 hours  Studies/Results: Dg Chest 2 View  12/25/2013   CLINICAL DATA:  Chronic cough, congestion  EXAM: CHEST  2 VIEW  COMPARISON:  10/03/2011  FINDINGS: Cardiomediastinal silhouette is stable. No acute infiltrate or pleural effusion. No pulmonary edema. Bony thorax is unremarkable.  IMPRESSION: No active cardiopulmonary disease.   Electronically Signed   By: Lahoma Crocker M.D.   On: 12/25/2013 17:11   US Renal  12/26/2013   CLINICAL DATA:  Acute renal failure  EXAM: RENAL/URINARY TRACT ULTRASOUND COMPLETE  COMPARISON:  US RENAL dated  10/09/2011; CT ABD/PELVIS W CM dated 10/05/2011  FINDINGS: Right Kidney:  Length: 11.7 cm. There has been interval development of moderate hydronephrosis when compared to the previous study. There is no evidence of solid nor cystic masses, no evidence of cortical thinning. There does appear to be appropriate corticomedullary differentiation though there is slight increased echogenicity of the renal cortex. There is mild to moderate hydroureter within the visualized proximal right ureter.  Left Kidney:  Length: 11.6 cm. Moderate hydronephrosis is appreciated within the left kidney which is also developed in the interim. A stable 2 x 2.5 x 2 6 cm benign-appearing cyst is appreciated within the left kidney. Hydroureter is appreciated within the visualized portion of the left ureter. Corticomedullary differentiation is preserved with no there is mild increased echogenicity of the renal cortex. The previous described perinephric fluid collection is not clearly identified on the present study. There does appear to be an area of focal cortical thinning in this region consistent with an area of scarring.  Bladder:  The urinary bladder demonstrates mild trabeculation. A left ureteral jet is appreciated. A right ureteral jet is not clearly identified an equivocal finding. The prostate is enlarged with a volume of 52 cc.  IMPRESSION: 1. Moderate bilateral hydronephrosis. There is also evidence of hydroureter bilaterally. No obstructing masses nor appreciable calculi identified. Clinical correlation recommended and if clinically warranted further evaluation with renal protocol CT is recommended. 2. Trabeculated bladder wall and enlarged prostate. These findings may reflect sequela of a component of bladder  outlet obstruction. Clinical correlation recommended.   Electronically Signed   By: Margaree Mackintosh M.D.   On: 12/26/2013 09:56    I have reviewed the patient's current medications.  Assessment/Plan: Problem #1 renal  failure: This seems to be acute on chronic. His BUN and creatinine presently is improving and patient is none oliguric. The etiology for his acute renal failure seems to be obstructive uropathy possibly related to his BPH. Patient presently with bilateral moderate hydronephrosis. Patient declined Foley catheter insertion. 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 Problem #3 diabetes Problem #4 history of BPH Problem #5 mental retardation Problem #6 history of pyelonephritis and perinephric abscess Problem #7 metabolic bone disease: His calcium and phosphorus is in range Problem #8 anemia: Etiology as this moment is not clear. Plan: We'll start patient on Flomax 0.4 mg by mouth daily Patient may benefit from urology evaluation possibly as an outpatient.   LOS: 2 days   Surabhi Gadea S 12/27/2013,7:42 AM

## 2013-12-28 LAB — GLUCOSE, CAPILLARY
Glucose-Capillary: 140 mg/dL — ABNORMAL HIGH (ref 70–99)
Glucose-Capillary: 104 mg/dL — ABNORMAL HIGH (ref 70–99)
Glucose-Capillary: 163 mg/dL — ABNORMAL HIGH (ref 70–99)
Glucose-Capillary: 60 mg/dL — ABNORMAL LOW (ref 70–99)
Glucose-Capillary: 110 mg/dL — ABNORMAL HIGH (ref 70–99)

## 2013-12-28 LAB — BASIC METABOLIC PANEL
BUN: 20 mg/dL (ref 6–23)
CO2: 22 mEq/L (ref 19–32)
Calcium: 8.5 mg/dL (ref 8.4–10.5)
Chloride: 114 mEq/L — ABNORMAL HIGH (ref 96–112)
Creatinine, Ser: 1.75 mg/dL — ABNORMAL HIGH (ref 0.50–1.35)
GFR calc Af Amer: 45 mL/min — ABNORMAL LOW (ref >90)
GFR calc non Af Amer: 39 mL/min — ABNORMAL LOW (ref >90)
Glucose, Bld: 91 mg/dL (ref 70–99)
Potassium: 4.5 mEq/L (ref 3.7–5.3)
Sodium: 146 mEq/L (ref 137–147)

## 2013-12-28 LAB — SURGICAL PCR SCREEN
MRSA, PCR: NEGATIVE
Staphylococcus aureus: NEGATIVE

## 2013-12-28 LAB — CULTURE, GROUP A STREP

## 2013-12-28 MED ORDER — INSULIN ASPART PROT & ASPART (70-30 MIX) 100 UNIT/ML ~~LOC~~ SUSP
30.0000 [IU] | Freq: Two times a day (BID) | SUBCUTANEOUS | Status: DC
  Administered 2013-12-28 – 2013-12-31 (×4): 30 [IU] via SUBCUTANEOUS
  Filled 2013-12-28 (×2): qty 10

## 2013-12-28 NOTE — Consult Note (Signed)
Has bilateral hydronephrosis new on r side will need bilateral retrograde

## 2013-12-28 NOTE — Progress Notes (Signed)
Subjective: Patient feels better. His renal ultrasound showed bilateral hydronephrosis.   Objective: Vital signs in last 24 hours: Temp:  [97.6 F (36.4 C)-97.7 F (36.5 C)] 97.6 F (36.4 C) (03/04 0546) Pulse Rate:  [80-88] 80 (03/04 0546) Resp:  [18-20] 20 (03/04 0546) BP: (133-165)/(80-89) 160/89 mmHg (03/04 0614) SpO2:  [99 %-100 %] 99 % (03/04 0546) Weight change:  Last BM Date: 12/27/13  Intake/Output from previous day: 03/03 0701 - 03/04 0700 In: 3631.7 [P.O.:840; I.V.:2791.7] Out: 1325 [Urine:1325]  PHYSICAL EXAM General appearance: alert and no distress Resp: diminished breath sounds bilaterally and rhonchi bilaterally Cardio: S1, S2 normal GI: soft, non-tender; bowel sounds normal; no masses,  no organomegaly Extremities: extremities normal, atraumatic, no cyanosis or edema  Lab Results:    @labtest @ [facility]  Recent Labs  12/25/13 1657  TCO2 24   CULTURES Recent Results (from the past 240 hour(s))  RAPID STREP SCREEN     Status: None   Collection Time    12/26/13  2:39 AM      Result Value Ref Range Status   Streptococcus, Group A Screen (Direct) NEGATIVE  NEGATIVE Final   Comment: (NOTE)     A Rapid Antigen test may result negative if the antigen level in the     sample is below the detection level of this test. The FDA has not     cleared this test as a stand-alone test therefore the rapid antigen     negative result has reflexed to a Group A Strep culture.  CULTURE, GROUP A STREP     Status: None   Collection Time    12/26/13  2:39 AM      Result Value Ref Range Status   Specimen Description THROAT   Final   Special Requests NONE   Final   Culture     Final   Value: NO SUSPICIOUS COLONIES, CONTINUING TO HOLD     Performed at [facility]   Report Status PENDING   Incomplete   Studies/Results: US Renal  12/26/2013   CLINICAL DATA:  Acute renal failure  EXAM: RENAL/URINARY TRACT ULTRASOUND COMPLETE  COMPARISON:  US RENAL dated  10/09/2011; CT ABD/PELVIS W CM dated 10/05/2011  FINDINGS: Right Kidney:  Length: 11.7 cm. There has been interval development of moderate hydronephrosis when compared to the previous study. There is no evidence of solid nor cystic masses, no evidence of cortical thinning. There does appear to be appropriate corticomedullary differentiation though there is slight increased echogenicity of the renal cortex. There is mild to moderate hydroureter within the visualized proximal right ureter.  Left Kidney:  Length: 11.6 cm. Moderate hydronephrosis is appreciated within the left kidney which is also developed in the interim. A stable 2 x 2.5 x 2 6 cm benign-appearing cyst is appreciated within the left kidney. Hydroureter is appreciated within the visualized portion of the left ureter. Corticomedullary differentiation is preserved with no there is mild increased echogenicity of the renal cortex. The previous described perinephric fluid collection is not clearly identified on the present study. There does appear to be an area of focal cortical thinning in this region consistent with an area of scarring.  Bladder:  The urinary bladder demonstrates mild trabeculation. A left ureteral jet is appreciated. A right ureteral jet is not clearly identified an equivocal finding. The prostate is enlarged with a volume of 52 cc.  IMPRESSION: 1. Moderate bilateral hydronephrosis. There is also evidence of hydroureter bilaterally. No obstructing masses nor appreciable calculi identified.  Clinical correlation recommended and if clinically warranted further evaluation with renal protocol CT is recommended. 2. Trabeculated bladder wall and enlarged prostate. These findings may reflect sequela of a component of bladder outlet obstruction. Clinical correlation recommended.   Electronically Signed   By: Margaree Mackintosh M.D.   On: 12/26/2013 09:56    Medications: I have reviewed the patient's current medications.  Assesment: Active  Problems:   DM (diabetes mellitus), type 2, uncontrolled   Mental retardation   Acute renal failure   Renal insufficiency   Dehydration    Plan: Medications reviewed Continue Iv rehydration Will monitor BMP Will do urology consul tappreciated     LOS: 3 days   Clifton Kovacic 12/28/2013, 8:27 AM

## 2013-12-28 NOTE — Progress Notes (Signed)
Zachary [name]  MRN: [id number]  DOB/AGE: Jun 03, 1946 68 y.o.  Primary Care Physician:FANTA,TESFAYE, MD  Admit date: 12/25/2013  Chief Complaint:  Chief Complaint  Patient presents with  . Cough  . Hyperglycemia    S-Pt presented on  12/25/2013 with  Chief Complaint  Patient presents with  . Cough  . Hyperglycemia  .    Pt today feels better.Pt offers no new complaints   Meds . azithromycin  250 mg Oral Daily  . enoxaparin (LOVENOX) injection  40 mg Subcutaneous Q24H  . insulin aspart  0-15 Units Subcutaneous TID WC  . insulin aspart  0-5 Units Subcutaneous QHS  . insulin aspart protamine- aspart  40 Units Subcutaneous BID WC  . levothyroxine  75 mcg Oral QAC breakfast  . niacin  1,000 mg Oral QHS  . simvastatin  40 mg Oral Daily  . tamsulosin  0.4 mg Oral Daily     Physical Exam: Vital signs in last 24 hours: Temp:  [97.6 F (36.4 C)-97.7 F (36.5 C)] 97.6 F (36.4 C) (03/04 0546) Pulse Rate:  [80-88] 80 (03/04 0546) Resp:  [18-20] 20 (03/04 0546) BP: (133-165)/(80-89) 160/89 mmHg (03/04 0614) SpO2:  [99 %-100 %] 99 % (03/04 0546) Weight change:  Last BM Date: 12/27/13  Intake/Output from previous day: 03/03 0701 - 03/04 0700 In: 3631.7 [P.O.:840; I.V.:2791.7] Out: 1325 [Urine:1325] Total I/O In: 240 [P.O.:240] Out: -    Physical Exam: General- pt is awake,alert, oriented to time place and person Resp- No acute REsp distress, CTA B/L NO Rhonchi CVS- S1S2 regular in rate and rhythm GIT- BS+, soft, NT, ND EXT- NO LE Edema, Cyanosis   Lab Results: CBC  Recent Labs  12/26/13 0544 12/27/13 0506  WBC 3.2* 4.0  HGB 9.2* 9.3*  HCT 26.5* 27.4*  PLT 148* 164    BMET  Recent Labs  12/27/13 0506 12/28/13 0521  NA 145 146  K 4.3 4.5  CL 111 114*  CO2 22 22  GLUCOSE 54* 91  BUN 26* 20  CREATININE 1.95* 1.75*  CALCIUM 8.5 8.5   Trend Creat 2015   3.23==>1.95==>1.75  MICRO Recent Results (from the past 240 hour(s))  RAPID STREP SCREEN      Status: None   Collection Time    12/26/13  2:39 AM      Result Value Ref Range Status   Streptococcus, Group A Screen (Direct) NEGATIVE  NEGATIVE Final   Comment: (NOTE)     A Rapid Antigen test may result negative if the antigen level in the     sample is below the detection level of this test. The FDA has not     cleared this test as a stand-alone test therefore the rapid antigen     negative result has reflexed to a Group A Strep culture.  CULTURE, GROUP A STREP     Status: None   Collection Time    12/26/13  2:39 AM      Result Value Ref Range Status   Specimen Description THROAT   Final   Special Requests NONE   Final   Culture     Final   Value: No Beta Hemolytic Streptococci Isolated     Performed at [facility]   Report Status 12/28/2013 FINAL   Final      Lab Results  Component Value Date   PTH 78.7* 12/26/2013   CALCIUM 8.5 12/28/2013   CAION 1.17 12/25/2013   PHOS 3.6 12/27/2013  Renal u/s 1. Moderate bilateral hydronephrosis. There is also evidence of  hydroureter bilaterally. No obstructing masses nor appreciable  calculi identified. Clinical correlation recommended and if  clinically warranted further evaluation with renal protocol CT is  recommended.  2. Trabeculated bladder wall and enlarged prostate. These findings  may reflect sequela of a component of bladder outlet obstruction.  Clinical correlation recommended.     Impression: 1)Renal  AKI secondary to Post renal                AKI on CKD                AKI now much better                Creat trending down                CKD stage 2 .                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 Secondary Hyperparathyroidism  present . Phosphorus at goal.   5)CNS-Hx of mental Retardation  PMD  following  6)Electrolytes Normokalemic NOrmonatremic  7) Urology - Pt with b/l hydronephrosis     Urology involved  7)Acid base Co2 at goal     Plan:  Will continue current  care      Reynolds S 12/28/2013, 1:54 PM

## 2013-12-28 NOTE — Consult Note (Signed)
Note [phone]

## 2013-12-29 ENCOUNTER — Inpatient Hospital Stay (HOSPITAL_COMMUNITY): Payer: Medicare Other | Admitting: Anesthesiology

## 2013-12-29 ENCOUNTER — Encounter (HOSPITAL_COMMUNITY): Payer: Medicare Other | Admitting: Anesthesiology

## 2013-12-29 ENCOUNTER — Encounter (HOSPITAL_COMMUNITY): Payer: Self-pay | Admitting: Anesthesiology

## 2013-12-29 ENCOUNTER — Encounter (HOSPITAL_COMMUNITY)
Admission: EM | Disposition: A | Discharge status: Home / Self Care | Payer: Self-pay | Source: Home / Self Care | Attending: Internal Medicine

## 2013-12-29 ENCOUNTER — Inpatient Hospital Stay (HOSPITAL_COMMUNITY): Payer: Medicare Other

## 2013-12-29 DIAGNOSIS — N400 Enlarged prostate without lower urinary tract symptoms: Secondary | ICD-10-CM | POA: Diagnosis present

## 2013-12-29 HISTORY — PX: CYSTOSCOPY WITH URETHRAL DILATATION: SHX5125

## 2013-12-29 LAB — GLUCOSE, CAPILLARY
Glucose-Capillary: 68 mg/dL — ABNORMAL LOW (ref 70–99)
Glucose-Capillary: 87 mg/dL (ref 70–99)
Glucose-Capillary: 82 mg/dL (ref 70–99)
Glucose-Capillary: 134 mg/dL — ABNORMAL HIGH (ref 70–99)
Glucose-Capillary: 152 mg/dL — ABNORMAL HIGH (ref 70–99)
Glucose-Capillary: 134 mg/dL — ABNORMAL HIGH (ref 70–99)

## 2013-12-29 LAB — VITAMIN D 1,25 DIHYDROXY
Vitamin D 1, 25 (OH)2 Total: 41 pg/mL (ref 18–72)
Vitamin D2 1, 25 (OH)2: 21 pg/mL
Vitamin D3 1, 25 (OH)2: 20 pg/mL

## 2013-12-29 SURGERY — CYSTOSCOPY, WITH URETHRAL DILATION
Anesthesia: Monitor Anesthesia Care | Site: Urethra

## 2013-12-29 MED ORDER — FERUMOXYTOL INJECTION 510 MG/17 ML
510.0000 mg | Freq: Once | INTRAVENOUS | Status: DC
  Filled 2013-12-29: qty 17

## 2013-12-29 MED ORDER — SODIUM CHLORIDE 0.9 % IV SOLN
INTRAVENOUS | Status: DC
  Administered 2013-12-29: 13:00:00 via INTRAVENOUS

## 2013-12-29 MED ORDER — FENTANYL CITRATE 0.05 MG/ML IJ SOLN
INTRAMUSCULAR | Status: AC
  Filled 2013-12-29: qty 2

## 2013-12-29 MED ORDER — ONDANSETRON HCL 4 MG/2ML IJ SOLN
4.0000 mg | Freq: Once | INTRAMUSCULAR | Status: AC
  Administered 2013-12-29: 4 mg via INTRAVENOUS
  Filled 2013-12-29: qty 2

## 2013-12-29 MED ORDER — LACTATED RINGERS IV SOLN: INTRAVENOUS | Status: DC

## 2013-12-29 MED ORDER — DEXTROSE 50 % IV SOLN
INTRAVENOUS | Status: AC
  Filled 2013-12-29: qty 50

## 2013-12-29 MED ORDER — FENTANYL CITRATE 0.05 MG/ML IJ SOLN
25.0000 ug | INTRAMUSCULAR | Status: AC
  Administered 2013-12-29 (×2): 25 ug via INTRAVENOUS
  Filled 2013-12-29: qty 2

## 2013-12-29 MED ORDER — ONDANSETRON HCL 4 MG/2ML IJ SOLN: 4.0000 mg | Freq: Once | INTRAMUSCULAR | Status: DC | PRN

## 2013-12-29 MED ORDER — HYDROMORPHONE HCL PF 1 MG/ML IJ SOLN
0.5000 mg | INTRAMUSCULAR | Status: DC | PRN
  Administered 2014-01-01 – 2014-01-07 (×3): 0.5 mg via INTRAVENOUS
  Filled 2013-12-29 (×3): qty 1

## 2013-12-29 MED ORDER — IOHEXOL 350 MG/ML SOLN
INTRAVENOUS | Status: DC | PRN
  Administered 2013-12-29: 50 mL

## 2013-12-29 MED ORDER — FENTANYL CITRATE 0.05 MG/ML IJ SOLN
INTRAMUSCULAR | Status: DC | PRN
  Administered 2013-12-29 (×3): 25 ug via INTRAVENOUS

## 2013-12-29 MED ORDER — SODIUM CHLORIDE 0.9 % IR SOLN
Status: DC | PRN
  Administered 2013-12-29 (×2): 3000 mL

## 2013-12-29 MED ORDER — MIDAZOLAM HCL 2 MG/2ML IJ SOLN
1.0000 mg | INTRAMUSCULAR | Status: DC | PRN
  Administered 2013-12-29: 2 mg via INTRAVENOUS
  Filled 2013-12-29: qty 2

## 2013-12-29 MED ORDER — DEXTROSE-NACL 5-0.45 % IV SOLN
INTRAVENOUS | Status: DC
  Administered 2013-12-29 – 2013-12-30 (×2): via INTRAVENOUS

## 2013-12-29 MED ORDER — LIDOCAINE HCL 2 % EX GEL
CUTANEOUS | Status: AC
  Filled 2013-12-29: qty 10

## 2013-12-29 MED ORDER — GLYCOPYRROLATE 0.2 MG/ML IJ SOLN
0.2000 mg | Freq: Once | INTRAMUSCULAR | Status: AC
  Administered 2013-12-29: 0.2 mg via INTRAVENOUS
  Filled 2013-12-29: qty 1

## 2013-12-29 MED ORDER — STERILE WATER FOR IRRIGATION IR SOLN
Status: DC | PRN
  Administered 2013-12-29: 1000 mL

## 2013-12-29 MED ORDER — LIDOCAINE HCL (CARDIAC) 10 MG/ML IV SOLN
INTRAVENOUS | Status: DC | PRN
  Administered 2013-12-29: 10 mg via INTRAVENOUS

## 2013-12-29 MED ORDER — PROPOFOL INFUSION 10 MG/ML OPTIME
INTRAVENOUS | Status: DC | PRN
  Administered 2013-12-29: 100 ug/kg/min via INTRAVENOUS
  Administered 2013-12-29: 125 ug/kg/min via INTRAVENOUS

## 2013-12-29 MED ORDER — LIDOCAINE HCL 2 % EX GEL
CUTANEOUS | Status: DC | PRN
  Administered 2013-12-29: 1 via URETHRAL

## 2013-12-29 MED ORDER — FENTANYL CITRATE 0.05 MG/ML IJ SOLN: 25.0000 ug | INTRAMUSCULAR | Status: DC | PRN

## 2013-12-29 MED ORDER — DEXTROSE 50 % IV SOLN
25.0000 mL | Freq: Once | INTRAVENOUS | Status: AC
  Administered 2013-12-29: 25 mL via INTRAVENOUS

## 2013-12-29 SURGICAL SUPPLY — 30 items
BAG DRAIN URO TABLE W/ADPT NS (DRAPE) ×3 IMPLANT
BAG DRN 8 ADPR NS SKTRN CSTL (DRAPE) ×2
BAG HAMPER (MISCELLANEOUS) ×3 IMPLANT
CATH 5 FR WEDGE TIP (UROLOGICAL SUPPLIES) ×3 IMPLANT
CLOTH BEACON ORANGE TIMEOUT ST (SAFETY) ×3 IMPLANT
DECANTER SPIKE VIAL GLASS SM (MISCELLANEOUS) ×3 IMPLANT
DILATOR BALLN URETERAL SET (BALLOONS) IMPLANT
FLOOR PAD 36X40 (MISCELLANEOUS)
GLOVE BIO SURGEON STRL SZ7 (GLOVE) ×3 IMPLANT
GLOVE BIOGEL PI IND STRL 7.0 (GLOVE) ×2 IMPLANT
GLOVE BIOGEL PI IND STRL 7.5 (GLOVE) ×2 IMPLANT
GLOVE BIOGEL PI IND STRL 8.5 (GLOVE) ×2 IMPLANT
GLOVE BIOGEL PI INDICATOR 7.0 (GLOVE) ×1
GLOVE BIOGEL PI INDICATOR 7.5 (GLOVE) ×1
GLOVE BIOGEL PI INDICATOR 8.5 (GLOVE) ×1
GLOVE ECLIPSE 8.0 STRL XLNG CF (GLOVE) ×3 IMPLANT
GLOVE SS BIOGEL STRL SZ 6.5 (GLOVE) ×2 IMPLANT
GLOVE SUPERSENSE BIOGEL SZ 6.5 (GLOVE) ×1
GOWN STRL REUS W/TWL LRG LVL3 (GOWN DISPOSABLE) ×9 IMPLANT
IV NS IRRIG 3000ML ARTHROMATIC (IV SOLUTION) ×6 IMPLANT
KIT ROOM TURNOVER AP CYSTO (KITS) ×3 IMPLANT
MANIFOLD NEPTUNE II (INSTRUMENTS) ×3 IMPLANT
PACK CYSTO (CUSTOM PROCEDURE TRAY) ×3 IMPLANT
PAD ARMBOARD 7.5X6 YLW CONV (MISCELLANEOUS) ×3 IMPLANT
PAD FLOOR 36X40 (MISCELLANEOUS) IMPLANT
SET IRRIGATING DISP (SET/KITS/TRAYS/PACK) ×3 IMPLANT
STENT PERCUFLEX 4.8FRX24 (STENTS) IMPLANT
TOWEL OR 17X26 4PK STRL BLUE (TOWEL DISPOSABLE) IMPLANT
WATER STERILE IRR 1000ML POUR (IV SOLUTION) ×3 IMPLANT
WIRE GUIDE BENTSON .035 15CM (WIRE) ×3 IMPLANT

## 2013-12-29 NOTE — Progress Notes (Signed)
[id number] 

## 2013-12-29 NOTE — Consult Note (Signed)
Zachary [name], EDSALL NO.:  [id number]  MEDICAL RECORD NO.:  [id number]  LOCATION:  A307                          FACILITY:  [facility]  PHYSICIAN:  Marissa Nestle, M.D.DATE OF BIRTH:  [birth year]  DATE OF CONSULTATION:  12/27/2013 DATE OF DISCHARGE:                                CONSULTATION   CHIEF COMPLAINT:  Left hydroureteronephrosis.  HISTORY:  This patient who is 68 years old.  I was called in to see that he has developed dilatation of left ureter which is new.  He had a renal ultrasound done a few days earlier in which it was not there but I reviewed his all the charts.  In 2012 December, he had a CT scan done that shows there is a hydroureter on that side, so I think this dilatation was there for couple of years.  There was no stone.  The left ureter is dilated down to the ureterovesical junction.  He has no pain. He is mentally retarded.  It is difficult to see if he has any problem. He has insulin-dependent diabetes.  He is primarily admitted on this admission with cough and sore throat for 2 weeks.  He has been using over-the-counter medications.  He is not very clear if he had a fever during this time or not.  He has no vomiting or diarrhea.  He says his urine has been red, so if it is red, it is possibly he was passing the blood in his urine.  In the emergency room, his creatinine was found to be 3.3, now it has come down to normal range 1.75.  On admission day, his creatinine was 3.3, BUN was 51.  PAST MEDICAL HISTORY: 1. Hypertension. 2. Insulin-dependent diabetes. 3. Smoking.  CT scan shows bladder wall thickening.  The renal ultrasound which was done couple of weeks ago.  There is some question about perinephric abscess on the left side, but he is not running any fever.  He has been doing okay.  CT scan which already done on October 05, 2011.  It shows that he has edema and fluid around the left kidney.  There is a symmetrical decreased  perfusion to the left kidney, mild fullness of the left intrarenal collecting system is evident and there is mild dilatation in left ureter down to the ureterovesical junction.  No other pathology is seen in the left kidney.  Circumferential bladder thickening may be secondary to a component of bladder outlet obstruction.  His urine cultures are negative.  Urinalysis does not show any gross hematuria.  On December 26, 2013, he had an urinalysis done that shows too numerous to count WBCs and too numerous to count RBCs, so there is some question that maybe passing blood in the urine.  PHYSICAL EXAMINATION:  GENERAL:  Moderately built male, not in acute distress, fully conscious, alert, oriented. VITAL SIGNS:  On examination, blood pressure is 138/85, temperature 98.2. ABDOMEN:  Soft, flat.  Liver, spleen, kidneys not palpable.  No CVA tenderness. EXTERNAL GENITALIA:  Uncircumcised.  Meatus adequate.  Testicles are normal. RECTAL:  Deferred. EXTREMITIES:  Normal.  IMPRESSION:  Hydroureteronephrosis, left side.  PLAN:  Cysto and  left retrograde pyelogram under anesthesia tomorrow.  I think thereafter if this cystoscopy is negative and retrograde is negative, then I will consider doing a CT scan.     Marissa Nestle, M.D.     MIJ/MEDQ  D:  12/28/2013  T:  12/29/2013  Job:  [id number]

## 2013-12-29 NOTE — Anesthesia Postprocedure Evaluation (Signed)
Anesthesia Post Note  Patient: Zachary [name]  Procedure(s) Performed: Procedure(s) (LRB): CYSTOSCOPY WITH URETHRAL DILATATION (N/A)  Anesthesia type: MAC  Patient location: PACU  Post pain: Pain level controlled  Post assessment: Post-op Vital signs reviewed, Patient's Cardiovascular Status Stable, Respiratory Function Stable, Patent Airway, No signs of Nausea or vomiting and Pain level controlled  Last Vitals:  Filed Vitals:   12/29/13 1358  BP: 140/88  Pulse: 106  Temp: 36.5 C  Resp: 16    Post vital signs: Reviewed and stable  Level of consciousness: awake and alert   Complications: No apparent anesthesia complications

## 2013-12-29 NOTE — Anesthesia Preprocedure Evaluation (Addendum)
Anesthesia Evaluation  Patient identified by MRN, date of birth, ID band Patient awake    Reviewed: Allergy & Precautions, H&P , NPO status , Patient's Chart, lab work & pertinent test results  Airway Mallampati: II TM Distance: >3 FB Neck ROM: Full    Dental  (+) Poor Dentition, Missing, Dental Advisory Given   Pulmonary neg pulmonary ROS,    Pulmonary exam normal       Cardiovascular hypertension, Pt. on medications Rhythm:Regular Rate:Normal     Neuro/Psych PSYCHIATRIC DISORDERS (mental retardation)    GI/Hepatic   Endo/Other  diabetes  Renal/GU Renal disease     Musculoskeletal   Abdominal   Peds  Hematology   Anesthesia Other Findings   Reproductive/Obstetrics                          Anesthesia Physical Anesthesia Plan  ASA: III  Anesthesia Plan: MAC   Post-op Pain Management:    Induction: Intravenous  Airway Management Planned:   Additional Equipment:   Intra-op Plan:   Post-operative Plan:   Informed Consent: I have reviewed the patients History and Physical, chart, labs and discussed the procedure including the risks, benefits and alternatives for the proposed anesthesia with the patient or authorized representative who has indicated his/her understanding and acceptance.     Plan Discussed with:   Anesthesia Plan Comments:         Anesthesia Quick Evaluation

## 2013-12-29 NOTE — OR Nursing (Signed)
Per telephone call , spoke with Carrolyn Meiers,  Patient brother and per him he is his leagal  guardian.  Stated he does not have  any paper documentation  In regards to [place] or [place] guardian. Just that his social service check comes to him. He is his next of kin.

## 2013-12-29 NOTE — Progress Notes (Signed)
Inpatient Diabetes Program Recommendations  AACE/ADA: New Consensus Statement on Inpatient Glycemic Control (2013)  Target Ranges:  Prepandial:   less than 140 mg/dL      Peak postprandial:   less than 180 mg/dL (1-2 hours)      Critically ill patients:  140 - 180 mg/dL   Results for Zachary [name], Zachary [name] (MRN [id number]) as of 12/29/2013 11:52  Ref. Range 12/28/2013 07:13 12/28/2013 11:07 12/28/2013 16:18 12/28/2013 20:24 12/28/2013 21:53 12/29/2013 07:19 12/29/2013 08:34 12/29/2013 11:13  Glucose-Capillary Latest Range: 70-99 mg/dL 140 (H) 104 (H) 163 (H) 60 (L) 110 (H) 68 (L) 87 82   Diabetes history: DM2  Outpatient Diabetes medications: 70/30 40 units BID  Current orders for Inpatient glycemic control: 70/30 30 units BID, Novolog 0-15 units AC, Novolog 0-5 units HS  Inpatient Diabetes Program Recommendations Insulin - Basal: Please decrease 70/30 to 20 units BID. Correction (SSI): Please decrease Novolog correction to sensitive scale. HgbA1C: Please order an A1C to evaluate glycemic control over the past 2-3 months.  Note: 70/30 dose was decreased on 3/4 to 30 units BID and patient still noted to have hypoglycemia last night at 8:24 and this morning fasting glucose 68 mg/dl.  Please consider further decreasing 70/30 to 20 units BID, decrease Novolog correction to sensitive scale, and order an A1C.   Thanks, Barnie Alderman, RN, MSN, CCRN Diabetes Coordinator Inpatient Diabetes Program [phone] (Team Pager) [phone] ([facility] office) [phone] [facility] office)

## 2013-12-29 NOTE — Transfer of Care (Signed)
Immediate Anesthesia Transfer of Care Note  Patient: Zachary [name]  Procedure(s) Performed: Procedure(s) (LRB): CYSTOSCOPY WITH URETHRAL DILATATION (N/A)  Patient Location: PACU  Anesthesia Type: MAC  Level of Consciousness: awake  Airway & Oxygen Therapy: Patient Spontanous Breathing. Nasal cannula  Post-op Assessment: Report given to PACU RN, Post -op Vital signs reviewed and stable and Patient moving all extremities  Post vital signs: Reviewed and stable  Complications: No apparent anesthesia complications

## 2013-12-29 NOTE — Progress Notes (Signed)
Subjective: Interval History: has no complaint of nausea or vomiting. Presently patient denies any difficulty in breathing. Patient overall feels better..  Objective: Vital signs in last 24 hours: Temp:  [97.9 F (36.6 C)-98.2 F (36.8 C)] 98.1 F (36.7 C) (03/05 0426) Pulse Rate:  [91-104] 91 (03/05 0426) Resp:  [20] 20 (03/05 0426) BP: (141-157)/(80-89) 152/82 mmHg (03/05 0426) SpO2:  [100 %] 100 % (03/05 0426) Weight:  [89.7 kg (197 lb 12 oz)] 89.7 kg (197 lb 12 oz) (03/05 0426) Weight change:   Intake/Output from previous day: 03/04 0701 - 03/05 0700 In: 3447.1 [P.O.:720; I.V.:2727.1] Out: 2000 [Urine:2000] Intake/Output this shift:    General appearance: alert, cooperative and mild distress Resp: clear to auscultation bilaterally Cardio: regular rate and rhythm, S1, S2 normal, no murmur, click, rub or gallop GI: soft, non-tender; bowel sounds normal; no masses,  no organomegaly Extremities: extremities normal, atraumatic, no cyanosis or edema  Lab Results:  Recent Labs  12/27/13 0506  WBC 4.0  HGB 9.3*  HCT 27.4*  PLT 164   BMET:   Recent Labs  12/27/13 0506 12/28/13 0521  NA 145 146  K 4.3 4.5  CL 111 114*  CO2 22 22  GLUCOSE 54* 91  BUN 26* 20  CREATININE 1.95* 1.75*  CALCIUM 8.5 8.5    Recent Labs  12/26/13 1316  PTH 78.7*   Iron Studies:   Recent Labs  12/27/13 0506  IRON 13*  TIBC 220  FERRITIN 59    Studies/Results: No results found.  I have reviewed the patient's current medications.  Assessment/Plan: Problem #1 renal failure: This seems to be acute on chronic. His BUN and creatinine presently is improving and patient is none oliguric. The etiology for his acute renal failure seems to be obstructive uropathy possibly related to his BPH. Patient presently with bilateral moderate hydronephrosis. Patient declined Foley catheter insertion. Problem #2 chronic renal failure: Possibly stage III and most likely secondary to focal  segmental nephrosclerosis. Problem #3 diabetes Problem #4 history of BPH Problem #5 mental retardation Problem #6 history of pyelonephritis and perinephric abscess Problem #7 metabolic bone disease: His calcium and phosphorus is in range Problem #8 anemia: Iron saturation of 6% and ferritin of 59. Hence patient has iron deficiency anemia. Plan: We'll start patient on Flomax 0.4 mg by mouth daily We'll give FERAHEME 510 mg IV one dose. I will give him an additional dose in a week. We'll check tablet panel in the morning. A patient's guide to be discharged out for patient in my office.   LOS: 4 days   Adrina Armijo S 12/29/2013,10:53 AM

## 2013-12-29 NOTE — Preoperative (Signed)
Beta Blockers   Reason not to administer Beta Blockers:Not Applicable 

## 2013-12-29 NOTE — Progress Notes (Signed)
He has severe phimosis needs circumcision and bilteral retrograde which i coulld not do under mac he also needs turp if he is having difficulty voiding all this can be done under same anesthesia iwill check residual tomorrow with bladder scan

## 2013-12-29 NOTE — Progress Notes (Signed)
MD informed of bs of 57 treated with an increased bs of 87.

## 2013-12-29 NOTE — Brief Op Note (Signed)
12/25/2013 - 12/29/2013  1:50 PM  PATIENT:  Shirlyn Goltz  68 y.o. male  PRE-OPERATIVE DIAGNOSIS:  bilateral hydro-ureteronephrosis   POST-OPERATIVE DIAGNOSIS:  bilateral hydro-ureteronephrosis   PROCEDURE:  Procedure(s): CYSTOSCOPY WITH RETROGRADE PYELOGRAM (Bilateral) CYSTOSCOPY WITH URETHRAL DILATATION (N/A)  SURGEON:  Surgeon(s) and Role:    * Marissa Nestle, MD - Primary  PHYSICIAN ASSISTANT:   ASSISTANTS: none   ANESTHESIA:   MAC  EBL:  Total I/O In: 300 [I.V.:300] Out: 800 [Urine:800]  BLOOD ADMINISTERED:none  DRAINS: none   LOCAL MEDICATIONS USED:  NONE  SPECIMEN:  No Specimen  DISPOSITION OF SPECIMEN:  N/A  COUNTS:  YES  TOURNIQUET:  * No tourniquets in log *  DICTATION: .Other Dictation: Dictation Number dictation [id number]  PLAN OF CARE: Admit to inpatient   PATIENT DISPOSITION:  PACU - hemodynamically stable.   Delay start of Pharmacological VTE agent (>24hrs) due to surgical blood loss or risk of bleeding:

## 2013-12-29 NOTE — Anesthesia Procedure Notes (Addendum)
Procedure Name: MAC Date/Time: 12/29/2013 1:00 PM Performed by: Vista Deck Pre-anesthesia Checklist: Patient identified, Emergency Drugs available, Suction available, Timeout performed and Patient being monitored Patient Re-evaluated:Patient Re-evaluated prior to inductionOxygen Delivery Method: Nasal Cannula   Procedure Name: MAC Date/Time: 12/29/2013 1:15 PM Performed by: Vista Deck Pre-anesthesia Checklist: Patient identified, Emergency Drugs available, Suction available, Timeout performed and Patient being monitored Patient Re-evaluated:Patient Re-evaluated prior to inductionOxygen Delivery Method: Non-rebreather mask

## 2013-12-30 ENCOUNTER — Inpatient Hospital Stay (HOSPITAL_COMMUNITY): Payer: Medicare Other

## 2013-12-30 LAB — CBC
HCT: 30.4 % — ABNORMAL LOW (ref 39.0–52.0)
Hemoglobin: 10.2 g/dL — ABNORMAL LOW (ref 13.0–17.0)
MCH: 32 pg (ref 26.0–34.0)
MCHC: 33.6 g/dL (ref 30.0–36.0)
MCV: 95.3 fL (ref 78.0–100.0)
Platelets: 216 10*3/uL (ref 150–400)
RBC: 3.19 MIL/uL — ABNORMAL LOW (ref 4.22–5.81)
RDW: 13.2 % (ref 11.5–15.5)
WBC: 5.3 10*3/uL (ref 4.0–10.5)

## 2013-12-30 LAB — GLUCOSE, CAPILLARY
Glucose-Capillary: 140 mg/dL — ABNORMAL HIGH (ref 70–99)
Glucose-Capillary: 139 mg/dL — ABNORMAL HIGH (ref 70–99)
Glucose-Capillary: 69 mg/dL — ABNORMAL LOW (ref 70–99)
Glucose-Capillary: 117 mg/dL — ABNORMAL HIGH (ref 70–99)

## 2013-12-30 LAB — BASIC METABOLIC PANEL
BUN: 11 mg/dL (ref 6–23)
CO2: 21 mEq/L (ref 19–32)
Calcium: 9.1 mg/dL (ref 8.4–10.5)
Chloride: 112 mEq/L (ref 96–112)
Creatinine, Ser: 1.71 mg/dL — ABNORMAL HIGH (ref 0.50–1.35)
GFR calc Af Amer: 47 mL/min — ABNORMAL LOW (ref >90)
GFR calc non Af Amer: 40 mL/min — ABNORMAL LOW (ref >90)
Glucose, Bld: 112 mg/dL — ABNORMAL HIGH (ref 70–99)
Potassium: 4.6 mEq/L (ref 3.7–5.3)
Sodium: 144 mEq/L (ref 137–147)

## 2013-12-30 NOTE — Progress Notes (Signed)
Nutrition Brief Note  RD pulled to chart due to LOS  Wt Readings from Last 15 Encounters:  12/29/13 197 lb 12 oz (89.7 kg)  12/29/13 197 lb 12 oz (89.7 kg)  10/04/11 189 lb 9.5 oz (86 kg)  09/30/11 187 lb 8 oz (85.049 kg)    Body mass index is 29.19 kg/(m^2). Patient meets criteria for overweight based on current BMI.   Current diet order is carb modified, patient is consuming approximately 75-100% of meals at this time. Labs and medications reviewed.   No nutrition interventions warranted at this time. If nutrition issues arise, please consult RD.   Adon Gehlhausen A. Jimmye Norman, RD, LDN Pager: [phone]

## 2013-12-30 NOTE — Anesthesia Postprocedure Evaluation (Signed)
  Anesthesia Post-op Note  Patient: Zachary [name]  Procedure(s) Performed: Procedure(s): CYSTOSCOPY WITH URETHRAL DILATATION (N/A)  Patient Location: Room 307  Anesthesia Type:MAC  Level of Consciousness: awake, alert  and oriented  Airway and Oxygen Therapy: Patient Spontanous Breathing  Post-op Pain: mild  Post-op Assessment: Post-op Vital signs reviewed, Patient's Cardiovascular Status Stable, Respiratory Function Stable, Patent Airway, No signs of Nausea or vomiting and Pain level controlled  Post-op Vital Signs: Reviewed and stable  Complications: No apparent anesthesia complications

## 2013-12-30 NOTE — Op Note (Signed)
NAMEGARICK, SMYSER NO.:  [id number]  MEDICAL RECORD NO.:  [id number]  LOCATION:  A307                          FACILITY:  [facility]  PHYSICIAN:  Marissa Nestle, M.D.DATE OF BIRTH:  [birth year]  DATE OF PROCEDURE: DATE OF DISCHARGE:                              OPERATIVE REPORT   PREOPERATIVE DIAGNOSIS:  Bilateral hydronephrosis.  POSTOPERATIVE DIAGNOSES:  Urethral stricture, benign prostatic hypertrophy.  PROCEDURE:  Cysto dilation and attempted to retrograde fill because we could not find the orifice as he has BPH.  The patient also refused to have general anesthesia.  He wanted do it under MAC which we tried and I could not find the orifices, so I had to quit.  He will have to agree with general anesthesia for me to do retrograde and I may have to bring him back again because of that.  The patient under MAC anesthesia, Xylocaine jelly was instilled into the urethra after waiting adequate time.  Rigid scope #25 was introduced under direct vision.  He also has phimosis, need to have circumcision. Without much difficulty, I was able to put the scope into the urethra under direct vision, and he has a stricture in the bulbar urethra, so I could not advance the scope beyond that.  I have removed the scope.  [employer] sounds up to 26 were introduced.  Cystoscope was then reintroduced under direct vision into the bladder.  Bladder is smooth.  No tumor, stone, foreign body, or inflammation.  He does have BPH with left lobe going across the midline causing bladder neck obstruction.  He has a bladder neck which was elevated, well-developed.  I could not find the orifices because of this, so I need to have general anesthesia to really do a retrograde.  He was moving around, so I quit the procedure, I removed the scope.  I tried with flexible cystoscope but it will not go beyond the mid penile urethra.  So, I quit the procedure, the patient left the operating room in  satisfactory condition.     Marissa Nestle, M.D.     MIJ/MEDQ  D:  12/29/2013  T:  12/30/2013  Job:  [id number]

## 2013-12-30 NOTE — Addendum Note (Signed)
Addendum created 12/30/13 1258 by Mickel Baas, CRNA   Modules edited: Notes Section   Notes Section:  File: [id number]

## 2013-12-30 NOTE — Progress Notes (Signed)
[id number] 

## 2013-12-30 NOTE — Progress Notes (Signed)
Will get renal u/s to see if bilateral hydronephrosis is getting any wors also to check post void residual with bladder scan.

## 2013-12-30 NOTE — Progress Notes (Signed)
Zachary [name], VERHAGEN NO.:  [id number]  MEDICAL RECORD NO.:  [id number]  LOCATION:  A307                          FACILITY:  [facility]  PHYSICIAN:  Unk Lightning, MDDATE OF BIRTH:  [birth year]  DATE OF PROCEDURE: DATE OF DISCHARGE:                                PROGRESS NOTE   The patient is a 68 year old, mentally retarded, black male with insulin- dependent diabetes, stage II chronic renal failure.  Creatinine improved from 3.3 down to 1.75, has bilateral hydroureters, having a cystoscopy today with Urology.  He likewise has bladder outlet obstruction, presumed BPH with secondary hyperparathyroidism, anemia of chronic disease as well as hypertension.  Hypertension under good control.  VITAL SIGNS:  Blood pressure 128/78.  The patient is poor historian. LUNGS:  Clear.  No rales, wheeze, or rhonchi. HEART:  Regular rhythm.  No S3, S4 auscultated.  No heaves, thrills, or rubs.  Renal function is improving.  Vitamin D within normal parameters. Glucoses, there is good glycemic control.  Plan right now is to continue insulin.  The patient is n.p.o. for bladder cystoscopy and possible retrograde pyelography.  Monitor renal function daily.  Glycemic control, hemodynamics, blood pressure, and we will make further recommendations as the database expands.     Unk Lightning, MD     RMD/MEDQ  D:  12/29/2013  T:  12/30/2013  Job:  [id number]

## 2013-12-30 NOTE — Progress Notes (Signed)
NAMEBURLE, KIVETT NO.:  [id number]  MEDICAL RECORD NO.:  [id number]  LOCATION:  A307                          FACILITY:  [facility]  PHYSICIAN:  Unk Lightning, MDDATE OF BIRTH:  05-22-1946  DATE OF PROCEDURE: DATE OF DISCHARGE:                                PROGRESS NOTE   The patient is a 68 year old mentally retarded black male with insulin- dependent diabetes, stage II chronic renal failure.  Creatinine improved to 1.71 down from 3.  Has hypertension, has secondary hyperparathyroidism, anemia of chronic disease.  There is good glycemic control today.  He had a cystoscopy yesterday showing bladder outlet obstruction, bladder neck, could not visualize the ureters.  He has bilateral hydronephrosis.  The patient was agitated on the table, therefore, he desisted.  Vitamin D is within normal parameters.  PHYSICAL EXAMINATION:  LUNGS:  Clear.  No rales, wheeze, or rhonchi. HEART:  Regular rhythm.  No S3, S4.  No heaves, thrills, or rubs. ABDOMEN:  Soft, nontender.  Bowel sounds normoactive.  No guarding, rebound.  PLAN:  Right now is to monitor glycemic control.  Monitor renal function.  We will consider early discharge if renal function continues to improve.  We will perform anemia profile.     Unk Lightning, MD     RMD/MEDQ  D:  12/30/2013  T:  12/30/2013  Job:  [id number]

## 2013-12-30 NOTE — Progress Notes (Signed)
Subjective: Interval History: has no complaint . Presently patient denies any difficulty in breathing. Patient overall feels better..  Objective: Vital signs in last 24 hours: Temp:  [97.4 F (36.3 C)-98 F (36.7 C)] 97.6 F (36.4 C) (03/06 0510) Pulse Rate:  [90-106] 98 (03/06 0510) Resp:  [12-21] 18 (03/06 0510) BP: (138-171)/(83-110) 154/83 mmHg (03/06 0510) SpO2:  [93 %-100 %] 100 % (03/06 0510) Weight change:   Intake/Output from previous day: 03/05 0701 - 03/06 0700 In: 3035 [P.O.:1080; I.V.:1955] Out: 800 [Urine:800] Intake/Output this shift:    General appearance: alert, cooperative and mild distress Resp: clear to auscultation bilaterally Cardio: regular rate and rhythm, S1, S2 normal, no murmur, click, rub or gallop GI: soft, non-tender; bowel sounds normal; no masses,  no organomegaly Extremities: extremities normal, atraumatic, no cyanosis or edema  Lab Results:  Recent Labs  12/30/13 0453  WBC 5.3  HGB 10.2*  HCT 30.4*  PLT 216   BMET:   Recent Labs  12/28/13 0521 12/30/13 0453  NA 146 144  K 4.5 4.6  CL 114* 112  CO2 22 21  GLUCOSE 91 112*  BUN 20 11  CREATININE 1.75* 1.71*  CALCIUM 8.5 9.1   No results found for this basename: PTH,  in the last 72 hours Iron Studies:  No results found for this basename: IRON, TIBC, TRANSFERRIN, FERRITIN,  in the last 72 hours  Studies/Results: No results found.  I have reviewed the patient's current medications.  Assessment/Plan: Problem #1 renal failure: This seems to be acute on chronic. His BUN and creatinine presently is improving and patient is none oliguric. He has no uremic signs and symptoms. 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 Problem #3 diabetes Problem #4 history of BPH Problem #5 mental retardation Problem #6 history of pyelonephritis and perinephric abscess Problem #7 metabolic bone disease: His calcium and  phosphorus is in range Problem #8 anemia: Iron saturation of 6% and ferritin of 59. Hence patient has iron deficiency anemia. Plan: We'll continue his present treatment.   LOS: 5 days   Zachary [name] S 12/30/2013,9:02 AM

## 2013-12-31 LAB — BASIC METABOLIC PANEL WITH GFR
BUN: 10 mg/dL (ref 6–23)
CO2: 19 meq/L (ref 19–32)
Calcium: 8.8 mg/dL (ref 8.4–10.5)
Chloride: 115 meq/L — ABNORMAL HIGH (ref 96–112)
Creatinine, Ser: 1.86 mg/dL — ABNORMAL HIGH (ref 0.50–1.35)
GFR calc Af Amer: 42 mL/min — ABNORMAL LOW (ref >90)
GFR calc non Af Amer: 36 mL/min — ABNORMAL LOW (ref >90)
Glucose, Bld: 100 mg/dL — ABNORMAL HIGH (ref 70–99)
Potassium: 4.5 meq/L (ref 3.7–5.3)
Sodium: 146 meq/L (ref 137–147)

## 2013-12-31 LAB — GLUCOSE, CAPILLARY
Glucose-Capillary: 99 mg/dL (ref 70–99)
Glucose-Capillary: 52 mg/dL — ABNORMAL LOW (ref 70–99)
Glucose-Capillary: 123 mg/dL — ABNORMAL HIGH (ref 70–99)
Glucose-Capillary: 129 mg/dL — ABNORMAL HIGH (ref 70–99)
Glucose-Capillary: 110 mg/dL — ABNORMAL HIGH (ref 70–99)

## 2013-12-31 MED ORDER — POLYSACCHARIDE IRON COMPLEX 150 MG PO CAPS
150.0000 mg | ORAL_CAPSULE | Freq: Every day | ORAL | Status: DC
  Administered 2013-12-31 – 2014-01-01 (×2): 150 mg via ORAL
  Filled 2013-12-31 (×2): qty 1

## 2013-12-31 MED ORDER — FERUMOXYTOL INJECTION 510 MG/17 ML
510.0000 mg | Freq: Once | INTRAVENOUS | Status: AC
  Administered 2013-12-31: 510 mg via INTRAVENOUS
  Filled 2013-12-31: qty 17

## 2013-12-31 MED ORDER — INSULIN ASPART PROT & ASPART (70-30 MIX) 100 UNIT/ML ~~LOC~~ SUSP
20.0000 [IU] | Freq: Two times a day (BID) | SUBCUTANEOUS | Status: DC
  Administered 2013-12-31 – 2014-01-07 (×10): 20 [IU] via SUBCUTANEOUS

## 2013-12-31 NOTE — Progress Notes (Signed)
Post Void Residual: Patient voided 175cc's urine, bladder scan resulted greater than 200 cc's urine.

## 2013-12-31 NOTE — Progress Notes (Signed)
NAMEGREORY, WALLETT NO.:  [id number]  MEDICAL RECORD NO.:  [id number]  LOCATION:  A307                          FACILITY:  [facility]  PHYSICIAN:  Unk Lightning, MDDATE OF BIRTH:  July 12, 1946  DATE OF PROCEDURE: DATE OF DISCHARGE:                                PROGRESS NOTE   SUBJECTIVE:  The patient has severe mental retardation.  I do not know his home situation.  States, he lives with his girlfriend.  He has insulin-dependent diabetes.  Glucose is running low 90s to 100s.  We will decrease his 70-30 to 30-20 b.i.d. with all meals.  He has chronic renal failure stage III, creatinine 1.86.  He has bilateral hydronephrosis with BPH and bladder outlet obstruction, controlled on Flomax at present.  The patient was too agitated to continue to cannulize ureters, hemoglobin is 10.2.  There is some iron deficiency and anemia of chronic disease, concomitantly BPH elevated at 78, started on Niferex 150 p.o. daily.  OBJECTIVE:  LUNGS:  Clear to A and P.  No rales, rhonchi. HEART:  Regular rhythm.  No S3, S4.  No heaves, thrills, or rubs.  PLAN:  Right now is to decrease insulin.  Monitor glycemic control.  If I feel this is safe home environment, we will send him home __________ otherwise we will require [place] on Monday.     Unk Lightning, MD     RMD/MEDQ  D:  12/31/2013  T:  12/31/2013  Job:  [id number]

## 2013-12-31 NOTE — Progress Notes (Signed)
[id number] 

## 2013-12-31 NOTE — Progress Notes (Signed)
Post void residual 200 cc it is significant will rpeat again today creainine went up slightly he will need circumcision and turp needs medical clearance.Marland Kitchen

## 2013-12-31 NOTE — Progress Notes (Signed)
Zachary SCHRAUTH  MRN: [id number]  DOB/AGE: 1946-03-07 68 y.o.  Primary Care Physician:FANTA,TESFAYE, MD  Admit date: 12/25/2013  Chief Complaint:  Chief Complaint  Patient presents with  . Cough  . Hyperglycemia    S-Pt presented on  12/25/2013 with  Chief Complaint  Patient presents with  . Cough  . Hyperglycemia  .    Pt today feels better.Pt offers no new complaints   Meds . azithromycin  250 mg Oral Daily  . enoxaparin (LOVENOX) injection  40 mg Subcutaneous Q24H  . ferumoxytol  510 mg Intravenous Once  . insulin aspart  0-15 Units Subcutaneous TID WC  . insulin aspart  0-5 Units Subcutaneous QHS  . insulin aspart protamine- aspart  30 Units Subcutaneous BID WC  . levothyroxine  75 mcg Oral QAC breakfast  . niacin  1,000 mg Oral QHS  . simvastatin  40 mg Oral Daily  . tamsulosin  0.4 mg Oral Daily     Physical Exam: Vital signs in last 24 hours: Temp:  [98.5 F (36.9 C)-98.6 F (37 C)] 98.6 F (37 C) (03/07 0455) Pulse Rate:  [86-111] 111 (03/07 0455) Resp:  [18] 18 (03/07 0455) BP: (137-158)/(77) 158/77 mmHg (03/07 0455) SpO2:  [97 %-100 %] 97 % (03/07 0455) Weight change:  Last BM Date: 12/30/13  Intake/Output from previous day: 03/06 0701 - 03/07 0700 In: 3323.3 [P.O.:720; I.V.:2603.3] Out: 500 [Urine:500] Total I/O In: 120 [P.O.:120] Out: 775 [Urine:775]   Physical Exam: General- pt is awake,alert, oriented to time place and person Resp- No acute REsp distress, CTA B/L NO Rhonchi CVS- S1S2 regular in rate and rhythm GIT- BS+, soft, NT, ND EXT- NO LE Edema, Cyanosis   Lab Results: CBC  Recent Labs  12/30/13 0453  WBC 5.3  HGB 10.2*  HCT 30.4*  PLT 216    BMET  Recent Labs  12/30/13 0453 12/31/13 0555  NA 144 146  K 4.6 4.5  CL 112 115*  CO2 21 19  GLUCOSE 112* 100*  BUN 11 10  CREATININE 1.71* 1.86*  CALCIUM 9.1 8.8   Trend Creat 2015   3.23==>1.95==>1.75==>1.86  MICRO Recent Results (from the past 240 hour(s))   RAPID STREP SCREEN     Status: None   Collection Time    12/26/13  2:39 AM      Result Value Ref Range Status   Streptococcus, Group A Screen (Direct) NEGATIVE  NEGATIVE Final   Comment: (NOTE)     A Rapid Antigen test may result negative if the antigen level in the     sample is below the detection level of this test. The FDA has not     cleared this test as a stand-alone test therefore the rapid antigen     negative result has reflexed to a Group A Strep culture.  CULTURE, GROUP A STREP     Status: None   Collection Time    12/26/13  2:39 AM      Result Value Ref Range Status   Specimen Description THROAT   Final   Special Requests NONE   Final   Culture     Final   Value: No Beta Hemolytic Streptococci Isolated     Performed at [facility]   Report Status 12/28/2013 FINAL   Final  SURGICAL PCR SCREEN     Status: None   Collection Time    12/28/13  5:20 PM      Result Value Ref Range Status   MRSA,  PCR NEGATIVE  NEGATIVE Final   Staphylococcus aureus NEGATIVE  NEGATIVE Final   Comment:            The Xpert SA Assay (FDA     approved for NASAL specimens     in patients over 95 years of age),     is one component of     a comprehensive surveillance     program.  Test performance has     been validated by [employer] for patients greater     than or equal to 85 year old.     It is not intended     to diagnose infection nor to     guide or monitor treatment.      Lab Results  Component Value Date   PTH 78.7* 12/26/2013   CALCIUM 8.8 12/31/2013   CAION 1.17 12/25/2013   PHOS 3.6 12/27/2013          Renal u/s 1. Moderate bilateral hydronephrosis. There is also evidence of  hydroureter bilaterally. No obstructing masses nor appreciable  calculi identified. Clinical correlation recommended and if  clinically warranted further evaluation with renal protocol CT is  recommended.  2. Trabeculated bladder wall and enlarged prostate. These findings  may  reflect sequela of a component of bladder outlet obstruction.  Clinical correlation recommended.     Impression: 1)Renal  AKI secondary to Post renal                AKI on CKD                Creat now at plateau                CKD stage 2 9 now progressed to stage 3 .                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 Secondary Hyperparathyroidism  present . Phosphorus at goal.   5)CNS-Hx of mental Retardation  PMD following  6)Electrolytes Normokalemic NOrmonatremic  7) Urology - Pt with b/l hydronephrosis     Urology involved  7)Acid base Co2 at goal     Plan:  Will continue current  care Pt will benefit from outpt nephrology follow up.     Jayvin Hurrell S 12/31/2013, 9:42 AM

## 2014-01-01 LAB — GLUCOSE, CAPILLARY
Glucose-Capillary: 70 mg/dL (ref 70–99)
Glucose-Capillary: 123 mg/dL — ABNORMAL HIGH (ref 70–99)
Glucose-Capillary: 140 mg/dL — ABNORMAL HIGH (ref 70–99)
Glucose-Capillary: 132 mg/dL — ABNORMAL HIGH (ref 70–99)

## 2014-01-01 LAB — CREATININE, SERUM
Creatinine, Ser: 1.82 mg/dL — ABNORMAL HIGH (ref 0.50–1.35)
GFR calc Af Amer: 43 mL/min — ABNORMAL LOW (ref >90)
GFR calc non Af Amer: 37 mL/min — ABNORMAL LOW (ref >90)

## 2014-01-01 NOTE — Progress Notes (Signed)
NAMEMEDARDO, BEDIENT NO.:  [id number]  MEDICAL RECORD NO.:  [id number]  LOCATION:  A307                          FACILITY:  [facility]  PHYSICIAN:  Unk Lightning, MDDATE OF BIRTH:  [birth year]  DATE OF PROCEDURE: DATE OF DISCHARGE:                                PROGRESS NOTE   The patient has severe mental retardation.  Has either BPH or bladder outlet obstruction.  After undergoing cystoscopy, the patient became agitated during the procedure and had to desist.  He has bilateral hydronephrosis.  Ureters were not cannulated during procedure, currently on Flomax.  His bladder scan reveals residual of 900.  He denies any suprapubic discomfort or urinary incontinence.  Urology has recommended, I believe, a TURP.  The patient is quite fearful.  I tried to talk to him about this.  He is anxious about it, I desisted. Creatinine improved to 1.82 at present.  He is insulin-dependent diabetic.  He is in good glycemic control.  Insulin was diminished yesterday and his lungs are clear.  No rales, wheezes, or rhonchi. Heart regular rhythm.  No S3, S4.  No heaves, thrills, or rubs.  Plan right now is to attempt to have TURP for presumed bladder outlet obstruction, controlled insulin-dependent diabetes.  I cannot ascertain the safety of his home situation, therefore we will not discharge.  No family members have come to visit him.     Unk Lightning, MD     RMD/MEDQ  D:  01/01/2014  T:  01/01/2014  Job:  [id number]

## 2014-01-01 NOTE — Progress Notes (Signed)
[id number] 

## 2014-01-01 NOTE — Progress Notes (Signed)
Note [phone]

## 2014-01-01 NOTE — Progress Notes (Signed)
Pt had >9100ml on a post void bladder scan. Pt denies complaint or symptoms of urinary retention. Dr. Michela Pitcher notified via phone of the above and order received to place a foley.  Upon explaining need for foley catheter and complications of urinary retention the patient refused to have the foley placed.  Dr. Michela Pitcher called back and notified that the patient refused to have the foley placed and no new orders were given.  Nursing staff to continue to monitor.

## 2014-01-01 NOTE — Progress Notes (Signed)
Post void residual: Pt voided in urinal 150 cc's urine, dark in color with blood clots, bladder scanned and results >999 cc's urine.  Pt is refusing to have foley catheter placed after explaining that the bladder is not emptying completely when voiding is being done in the urinal.

## 2014-01-01 NOTE — Progress Notes (Signed)
Zachary [name]  MRN: [id number]  DOB/AGE: November 26, 1945 68 y.o.  Primary Care Physician:FANTA,TESFAYE, MD  Admit date: 12/25/2013  Chief Complaint:  Chief Complaint  Patient presents with  . Cough  . Hyperglycemia    S-Pt presented on  12/25/2013 with  Chief Complaint  Patient presents with  . Cough  . Hyperglycemia  .    Pt today feels better.Pt offers no new complaints     Pt did agree for foley placement and has 1226ml urine out in a short time.  Meds . azithromycin  250 mg Oral Daily  . enoxaparin (LOVENOX) injection  40 mg Subcutaneous Q24H  . insulin aspart  0-15 Units Subcutaneous TID WC  . insulin aspart  0-5 Units Subcutaneous QHS  . insulin aspart protamine- aspart  20 Units Subcutaneous BID WC  . iron polysaccharides  150 mg Oral Daily  . levothyroxine  75 mcg Oral QAC breakfast  . niacin  1,000 mg Oral QHS  . simvastatin  40 mg Oral Daily  . tamsulosin  0.4 mg Oral Daily     Physical Exam: Vital signs in last 24 hours: Temp:  [97.9 F (36.6 C)-98.1 F (36.7 C)] 98 F (36.7 C) (03/08 0441) Pulse Rate:  [96-104] 96 (03/08 0441) Resp:  [18] 18 (03/08 0441) BP: (146-170)/(76-91) 170/76 mmHg (03/08 0441) SpO2:  [97 %-99 %] 97 % (03/08 0441) Weight change:  Last BM Date: 12/30/13  Intake/Output from previous day: 03/07 0701 - 03/08 0700 In: 2121.7 [P.O.:600; I.V.:1521.7] Out: 2074 [Urine:2074]     Physical Exam: General- pt is awake,alert, oriented to time place and person Resp- No acute REsp distress, CTA B/L NO Rhonchi CVS- S1S2 regular in rate and rhythm GIT- BS+, soft, NT, ND EXT- NO LE Edema, Cyanosis   Lab Results: CBC  Recent Labs  12/30/13 0453  WBC 5.3  HGB 10.2*  HCT 30.4*  PLT 216    BMET  Recent Labs  12/30/13 0453 12/31/13 0555 01/01/14 0601  NA 144 146  --   K 4.6 4.5  --   CL 112 115*  --   CO2 21 19  --   GLUCOSE 112* 100*  --   BUN 11 10  --   CREATININE 1.71* 1.86* 1.82*  CALCIUM 9.1 8.8  --    Trend  Creat 2015   3.23==>1.95==>1.75==>1.86==>1.82  MICRO Recent Results (from the past 240 hour(s))  RAPID STREP SCREEN     Status: None   Collection Time    12/26/13  2:39 AM      Result Value Ref Range Status   Streptococcus, Group A Screen (Direct) NEGATIVE  NEGATIVE Final   Comment: (NOTE)     A Rapid Antigen test may result negative if the antigen level in the     sample is below the detection level of this test. The FDA has not     cleared this test as a stand-alone test therefore the rapid antigen     negative result has reflexed to a Group A Strep culture.  CULTURE, GROUP A STREP     Status: None   Collection Time    12/26/13  2:39 AM      Result Value Ref Range Status   Specimen Description THROAT   Final   Special Requests NONE   Final   Culture     Final   Value: No Beta Hemolytic Streptococci Isolated     Performed at [facility]   Report Status 12/28/2013 FINAL  Final  SURGICAL PCR SCREEN     Status: None   Collection Time    12/28/13  5:20 PM      Result Value Ref Range Status   MRSA, PCR NEGATIVE  NEGATIVE Final   Staphylococcus aureus NEGATIVE  NEGATIVE Final   Comment:            The Xpert SA Assay (FDA     approved for NASAL specimens     in patients over 73 years of age),     is one component of     a comprehensive surveillance     program.  Test performance has     been validated by [employer] for patients greater     than or equal to 28 year old.     It is not intended     to diagnose infection nor to     guide or monitor treatment.      Lab Results  Component Value Date   PTH 78.7* 12/26/2013   CALCIUM 8.8 12/31/2013   CAION 1.17 12/25/2013   PHOS 3.6 12/27/2013          Renal u/s 1. Moderate bilateral hydronephrosis. There is also evidence of  hydroureter bilaterally. No obstructing masses nor appreciable  calculi identified. Clinical correlation recommended and if  clinically warranted further evaluation with renal protocol  CT is  recommended.  2. Trabeculated bladder wall and enlarged prostate. These findings  may reflect sequela of a component of bladder outlet obstruction.  Clinical correlation recommended.     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 Secondary Hyperparathyroidism  present . Phosphorus at goal.   5)CNS-Hx of mental Retardation  PMD following  6)Electrolytes Normokalemic NOrmonatremic  7) Urology - Pt with b/l hydronephrosis     Urology involved  7)Acid base Co2 at goal     Plan:  Will continue current  care      Modell Fendrick S 01/01/2014, 10:14 AM

## 2014-01-02 ENCOUNTER — Encounter (HOSPITAL_COMMUNITY): Payer: Self-pay | Admitting: Urology

## 2014-01-02 LAB — CBC WITH DIFFERENTIAL/PLATELET
Basophils Absolute: 0 10*3/uL (ref 0.0–0.1)
Basophils Relative: 0 % (ref 0–1)
Eosinophils Absolute: 0.1 10*3/uL (ref 0.0–0.7)
Eosinophils Relative: 1 % (ref 0–5)
HCT: 25.6 % — ABNORMAL LOW (ref 39.0–52.0)
Hemoglobin: 8.5 g/dL — ABNORMAL LOW (ref 13.0–17.0)
Lymphocytes Relative: 19 % (ref 12–46)
Lymphs Abs: 1.1 10*3/uL (ref 0.7–4.0)
MCH: 31.8 pg (ref 26.0–34.0)
MCHC: 33.2 g/dL (ref 30.0–36.0)
MCV: 95.9 fL (ref 78.0–100.0)
Monocytes Absolute: 0.9 10*3/uL (ref 0.1–1.0)
Monocytes Relative: 15 % — ABNORMAL HIGH (ref 3–12)
Neutro Abs: 3.8 10*3/uL (ref 1.7–7.7)
Neutrophils Relative %: 65 % (ref 43–77)
Platelets: 239 10*3/uL (ref 150–400)
RBC: 2.67 MIL/uL — ABNORMAL LOW (ref 4.22–5.81)
RDW: 13.3 % (ref 11.5–15.5)
WBC: 5.8 10*3/uL (ref 4.0–10.5)

## 2014-01-02 LAB — PREPARE RBC (CROSSMATCH)

## 2014-01-02 LAB — GLUCOSE, CAPILLARY
Glucose-Capillary: 137 mg/dL — ABNORMAL HIGH (ref 70–99)
Glucose-Capillary: 113 mg/dL — ABNORMAL HIGH (ref 70–99)
Glucose-Capillary: 134 mg/dL — ABNORMAL HIGH (ref 70–99)
Glucose-Capillary: 96 mg/dL (ref 70–99)

## 2014-01-02 MED ORDER — SODIUM CHLORIDE 0.9 % IV SOLN
125.0000 mg | Freq: Once | INTRAVENOUS | Status: AC
  Administered 2014-01-02: 125 mg via INTRAVENOUS
  Filled 2014-01-02: qty 10

## 2014-01-02 NOTE — Progress Notes (Signed)
Subjective: Interval History: has no complaint . Presently patient denies any difficulty in breathing. Patient overall feels better..  Objective: Vital signs in last 24 hours: Temp:  [97.9 F (36.6 C)-98.4 F (36.9 C)] 98.4 F (36.9 C) (03/09 0425) Pulse Rate:  [106-120] 120 (03/09 0425) Resp:  [18] 18 (03/09 0425) BP: (154-168)/(75-88) 166/88 mmHg (03/09 0425) SpO2:  [97 %-98 %] 97 % (03/09 0425) Weight change:   Intake/Output from previous day: 03/08 0701 - 03/09 0700 In: 3071.7 [P.O.:840; I.V.:2231.7] Out: [id number] [Urine:5599] Intake/Output this shift:    General appearance: alert, cooperative and mild distress Resp: clear to auscultation bilaterally Cardio: regular rate and rhythm, S1, S2 normal, no murmur, click, rub or gallop GI: soft, non-tender; bowel sounds normal; no masses,  no organomegaly Extremities: extremities normal, atraumatic, no cyanosis or edema  Lab Results:  Recent Labs  01/02/14 0455  WBC 5.8  HGB 8.5*  HCT 25.6*  PLT 239   BMET:   Recent Labs  12/31/13 0555 01/01/14 0601  NA 146  --   K 4.5  --   CL 115*  --   CO2 19  --   GLUCOSE 100*  --   BUN 10  --   CREATININE 1.86* 1.82*  CALCIUM 8.8  --    No results found for this basename: PTH,  in the last 72 hours Iron Studies:  No results found for this basename: IRON, TIBC, TRANSFERRIN, FERRITIN,  in the last 72 hours  Studies/Results: No results found.  I have reviewed the patient'[name] current medications.  Assessment/Plan: Problem #1 renal failure: This seems to be acute on chronic. His BUN and creatinine presently is improving and patient is none oliguric. He has no uremic signs and symptoms. 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 His renal function returned  to his baseline. Problem #3 diabetes Problem #4 history of BPH. Patient has a Foley catheter placed. Problem #5 mental retardation Problem #6 history  of pyelonephritis and perinephric abscess Problem #7 metabolic bone disease: His calcium and phosphorus is in range Problem #8 anemia: Iron saturation of 6% and ferritin of 59. Hence patient has iron deficiency anemia. His hemoglobin and hematocrit has declined. Plan: We'll start patient on IV iron today. We'll check his basic metabolic panel CBC and phosphorus in the morning.   LOS: 8 days   Zachary [name] 01/02/2014,8:02 AM

## 2014-01-02 NOTE — Progress Notes (Signed)
NAMEAYHAM, PEPE NO.:  [id number]  MEDICAL RECORD NO.:  [id number]  LOCATION:                                 FACILITY:  PHYSICIAN:  Marissa Nestle, M.D.DATE OF BIRTH:  06/18/1946  DATE OF PROCEDURE:  01/01/2014 DATE OF DISCHARGE:                                PROGRESS NOTE   Mr. Frade is a 69 year old gentleman.  His residual urine was about 900 mL in the bladder, but he says it is not hurting and we want to put a Foley catheter in.  His creatinine has slowly come down to 1.82 and yesterday it was 1.7.  It looks like he is in urinary retention.  He also has very severe phimosis.  It is possible that he will need a circumcision and TURP.  I have called his brother, who says he is going to come here, he is his guardian, and let him come and decide what he wants [place] to do.  His pulse is 106 per minute, blood pressure 154/85, temperature is 98.1.     Marissa Nestle, M.D.     MIJ/MEDQ  D:  01/01/2014  T:  01/01/2014  Job:  [id number]

## 2014-01-02 NOTE — Progress Notes (Signed)
He needs bilateral retrograde and circumcision and turp will see if it can be done on Wednesday

## 2014-01-02 NOTE — Progress Notes (Signed)
Subjective: Patient is resting. Cystoscopy was tried but he became agitated and procedure was stopped. He has a Foley catheter. Patient dropped from 10.2 to 8.5. Patient is mentally retartarded.   Objective: Vital signs in last 24 hours: Temp:  [97.9 F (36.6 C)-98.4 F (36.9 C)] 98.4 F (36.9 C) (03/09 0425) Pulse Rate:  [106-120] 120 (03/09 0425) Resp:  [18] 18 (03/09 0425) BP: (154-168)/(75-88) 166/88 mmHg (03/09 0425) SpO2:  [97 %-98 %] 97 % (03/09 0425) Weight change:  Last BM Date: 12/30/13  Intake/Output from previous day: 03/08 0701 - 03/09 0700 In: 3071.7 [P.O.:840; I.V.:2231.7] Out: 5599 [Urine:5599]  PHYSICAL EXAM General appearance: alert and no distress Resp: diminished breath sounds bilaterally and rhonchi bilaterally Cardio: S1, S2 normal GI: soft, non-tender; bowel sounds normal; no masses,  no organomegaly Extremities: extremities normal, atraumatic, no cyanosis or edema  Lab Results:    @labtest @ [facility] No results found for this basename: PHART, PCO2, PO2ART, TCO2, HCO3,  in the last 72 hours CULTURES Recent Results (from the past 240 hour(s))  RAPID STREP SCREEN     Status: None   Collection Time    12/26/13  2:39 AM      Result Value Ref Range Status   Streptococcus, Group A Screen (Direct) NEGATIVE  NEGATIVE Final   Comment: (NOTE)     A Rapid Antigen test may result negative if the antigen level in the     sample is below the detection level of this test. The FDA has not     cleared this test as a stand-alone test therefore the rapid antigen     negative result has reflexed to a Group A Strep culture.  CULTURE, GROUP A STREP     Status: None   Collection Time    12/26/13  2:39 AM      Result Value Ref Range Status   Specimen Description THROAT   Final   Special Requests NONE   Final   Culture     Final   Value: No Beta Hemolytic Streptococci Isolated     Performed at [facility]   Report Status 12/28/2013 FINAL   Final  SURGICAL  PCR SCREEN     Status: None   Collection Time    12/28/13  5:20 PM      Result Value Ref Range Status   MRSA, PCR NEGATIVE  NEGATIVE Final   Staphylococcus aureus NEGATIVE  NEGATIVE Final   Comment:            The Xpert SA Assay (FDA     approved for NASAL specimens     in patients over 72 years of age),     is one component of     a comprehensive surveillance     program.  Test performance has     been validated by [employer] for patients greater     than or equal to 15 year old.     It is not intended     to diagnose infection nor to     guide or monitor treatment.   Studies/Results: No results found.  Medications: I have reviewed the patient's current medications.  Assesment: Active Problems:   DM (diabetes mellitus), type 2, uncontrolled   Mental retardation   Acute renal failure   Renal insufficiency   Dehydration   BPH (benign prostatic hypertrophy) Anaemia    Plan: Medications reviewed Will repeat CBC in tomorrow and if now further drop or sign  of active bleeding patient will be discharged to followed in out patient.    LOS: 8 days   Eliav Mechling 01/02/2014, 7:59 AM

## 2014-01-03 LAB — CBC
HCT: 31.3 % — ABNORMAL LOW (ref 39.0–52.0)
Hemoglobin: 10.6 g/dL — ABNORMAL LOW (ref 13.0–17.0)
MCH: 30.9 pg (ref 26.0–34.0)
MCHC: 33.9 g/dL (ref 30.0–36.0)
MCV: 91.3 fL (ref 78.0–100.0)
Platelets: 222 10*3/uL (ref 150–400)
RBC: 3.43 MIL/uL — ABNORMAL LOW (ref 4.22–5.81)
RDW: 14.7 % (ref 11.5–15.5)
WBC: 6.4 10*3/uL (ref 4.0–10.5)

## 2014-01-03 LAB — GLUCOSE, CAPILLARY
Glucose-Capillary: 80 mg/dL (ref 70–99)
Glucose-Capillary: 117 mg/dL — ABNORMAL HIGH (ref 70–99)
Glucose-Capillary: 114 mg/dL — ABNORMAL HIGH (ref 70–99)
Glucose-Capillary: 104 mg/dL — ABNORMAL HIGH (ref 70–99)
Glucose-Capillary: 106 mg/dL — ABNORMAL HIGH (ref 70–99)

## 2014-01-03 LAB — BASIC METABOLIC PANEL
BUN: 11 mg/dL (ref 6–23)
CO2: 22 mEq/L (ref 19–32)
Calcium: 9 mg/dL (ref 8.4–10.5)
Chloride: 109 mEq/L (ref 96–112)
Creatinine, Ser: 1.67 mg/dL — ABNORMAL HIGH (ref 0.50–1.35)
GFR calc Af Amer: 48 mL/min — ABNORMAL LOW (ref >90)
GFR calc non Af Amer: 41 mL/min — ABNORMAL LOW (ref >90)
Glucose, Bld: 91 mg/dL (ref 70–99)
Potassium: 4.2 mEq/L (ref 3.7–5.3)
Sodium: 143 mEq/L (ref 137–147)

## 2014-01-03 LAB — PHOSPHORUS: Phosphorus: 3.3 mg/dL (ref 2.3–4.6)

## 2014-01-03 NOTE — Progress Notes (Signed)
Zachary [name]  MRN: [id number]  DOB/AGE: Feb 16, 1946 68 y.o.  Primary Care Physician:FANTA,TESFAYE, MD  Admit date: 12/25/2013  Chief Complaint:  Chief Complaint  Patient presents with  . Cough  . Hyperglycemia    S-Pt presented on  12/25/2013 with  Chief Complaint  Patient presents with  . Cough  . Hyperglycemia  .    Pt offers no new complaints     Meds . enoxaparin (LOVENOX) injection  40 mg Subcutaneous Q24H  . insulin aspart  0-15 Units Subcutaneous TID WC  . insulin aspart  0-5 Units Subcutaneous QHS  . insulin aspart protamine- aspart  20 Units Subcutaneous BID WC  . levothyroxine  75 mcg Oral QAC breakfast  . niacin  1,000 mg Oral QHS  . simvastatin  40 mg Oral Daily  . tamsulosin  0.4 mg Oral Daily     Physical Exam: Vital signs in last 24 hours: Temp:  [98 F (36.7 C)-99.3 F (37.4 C)] 98.7 F (37.1 C) (03/10 0621) Pulse Rate:  [92-119] 92 (03/10 0621) Resp:  [18] 18 (03/10 0621) BP: (130-166)/(82-91) 150/84 mmHg (03/10 0621) SpO2:  [97 %-100 %] 100 % (03/10 [id number]) Weight change:  Last BM Date: 01/02/14  Intake/Output from previous day: 03/09 0701 - 03/10 0700 In: 1977.9 [P.O.:360; I.V.:918.3; Blood:699.6] Out: 3300 [Urine:3300]     Physical Exam: General- pt is awake,alert, oriented to time place and person Resp- No acute REsp distress, CTA B/L NO Rhonchi CVS- S1S2 regular in rate and rhythm GIT- BS+, soft, NT, ND EXT- NO LE Edema, Cyanosis   Lab Results: CBC  Recent Labs  01/02/14 0455 01/03/14 0749  WBC 5.8 6.4  HGB 8.5* 10.6*  HCT 25.6* 31.3*  PLT 239 222    BMET  Recent Labs  01/01/14 0601 01/03/14 0749  NA  --  143  K  --  4.2  CL  --  109  CO2  --  22  GLUCOSE  --  91  BUN  --  11  CREATININE 1.82* 1.67*  CALCIUM  --  9.0   Trend Creat 2015   3.23==>1.67  MICRO Recent Results (from the past 240 hour(s))  RAPID STREP SCREEN     Status: None   Collection Time    12/26/13  2:39 AM      Result Value Ref Range  Status   Streptococcus, Group A Screen (Direct) NEGATIVE  NEGATIVE Final   Comment: (NOTE)     A Rapid Antigen test may result negative if the antigen level in the     sample is below the detection level of this test. The FDA has not     cleared this test as a stand-alone test therefore the rapid antigen     negative result has reflexed to a Group A Strep culture.  CULTURE, GROUP A STREP     Status: None   Collection Time    12/26/13  2:39 AM      Result Value Ref Range Status   Specimen Description THROAT   Final   Special Requests NONE   Final   Culture     Final   Value: No Beta Hemolytic Streptococci Isolated     Performed at [facility]   Report Status 12/28/2013 FINAL   Final  SURGICAL PCR SCREEN     Status: None   Collection Time    12/28/13  5:20 PM      Result Value Ref Range Status   MRSA, PCR NEGATIVE  NEGATIVE Final   Staphylococcus aureus NEGATIVE  NEGATIVE Final   Comment:            The Xpert SA Assay (FDA     approved for NASAL specimens     in patients over 57 years of age),     is one component of     a comprehensive surveillance     program.  Test performance has     been validated by [employer] for patients greater     than or equal to 40 year old.     It is not intended     to diagnose infection nor to     guide or monitor treatment.      Lab Results  Component Value Date   PTH 78.7* 12/26/2013   CALCIUM 9.0 01/03/2014   CAION 1.17 12/25/2013   PHOS 3.3 01/03/2014          Renal u/s 1. Moderate bilateral hydronephrosis. There is also evidence of  hydroureter bilaterally. No obstructing masses nor appreciable  calculi identified. Clinical correlation recommended and if  clinically warranted further evaluation with renal protocol CT is  recommended.  2. Trabeculated bladder wall and enlarged prostate. These findings  may reflect sequela of a component of bladder outlet obstruction.  Clinical correlation  recommended.     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 Secondary Hyperparathyroidism  present . Phosphorus at goal.   5)CNS-Hx of mental Retardation  PMD following  6)Electrolytes Normokalemic NOrmonatremic  7) Urology - Pt with b/l hydronephrosis     Urology involved  7)Acid base Co2 at goal     Plan:  Will continue current  care      [place] S 01/03/2014, 8:43 AM

## 2014-01-03 NOTE — Progress Notes (Signed)
Subjective: Patient is resting. He has a foley catheter. He is scheduled for TURP and circumcision for tomorrow.  Objective: Vital signs in last 24 hours: Temp:  [98 F (36.7 C)-99.3 F (37.4 C)] 98.7 F (37.1 C) (03/10 0621) Pulse Rate:  [92-119] 92 (03/10 0621) Resp:  [18] 18 (03/10 0621) BP: (130-166)/(82-91) 150/84 mmHg (03/10 0621) SpO2:  [97 %-100 %] 100 % (03/10 [id number]) Weight change:  Last BM Date: 01/02/14  Intake/Output from previous day: 03/09 0701 - 03/10 0700 In: 1977.9 [P.O.:360; I.V.:918.3; Blood:699.6] Out: 3300 [Urine:3300]  PHYSICAL EXAM General appearance: alert and no distress Resp: diminished breath sounds bilaterally and rhonchi bilaterally Cardio: S1, S2 normal GI: soft, non-tender; bowel sounds normal; no masses,  no organomegaly Extremities: extremities normal, atraumatic, no cyanosis or edema  Lab Results:    @labtest @ [facility] No results found for this basename: PHART, PCO2, PO2ART, TCO2, HCO3,  in the last 72 hours CULTURES Recent Results (from the past 240 hour(s))  RAPID STREP SCREEN     Status: None   Collection Time    12/26/13  2:39 AM      Result Value Ref Range Status   Streptococcus, Group A Screen (Direct) NEGATIVE  NEGATIVE Final   Comment: (NOTE)     A Rapid Antigen test may result negative if the antigen level in the     sample is below the detection level of this test. The FDA has not     cleared this test as a stand-alone test therefore the rapid antigen     negative result has reflexed to a Group A Strep culture.  CULTURE, GROUP A STREP     Status: None   Collection Time    12/26/13  2:39 AM      Result Value Ref Range Status   Specimen Description THROAT   Final   Special Requests NONE   Final   Culture     Final   Value: No Beta Hemolytic Streptococci Isolated     Performed at [facility]   Report Status 12/28/2013 FINAL   Final  SURGICAL PCR SCREEN     Status: None   Collection Time    12/28/13  5:20 PM   Result Value Ref Range Status   MRSA, PCR NEGATIVE  NEGATIVE Final   Staphylococcus aureus NEGATIVE  NEGATIVE Final   Comment:            The Xpert SA Assay (FDA     approved for NASAL specimens     in patients over 26 years of age),     is one component of     a comprehensive surveillance     program.  Test performance has     been validated by [employer] for patients greater     than or equal to 23 year old.     It is not intended     to diagnose infection nor to     guide or monitor treatment.   Studies/Results: No results found.  Medications: I have reviewed the patient's current medications.  Assesment: Active Problems:   DM (diabetes mellitus), type 2, uncontrolled   Mental retardation   Acute renal failure   Renal insufficiency   Dehydration   BPH (benign prostatic hypertrophy) Anaemia    Plan: Medications reviewed As per urology plan.    LOS: 9 days   Marcella Dunnaway 01/03/2014, 8:16 AM

## 2014-01-03 NOTE — Progress Notes (Signed)
hct after 2 units of blood 31,3 for turp am

## 2014-01-04 ENCOUNTER — Inpatient Hospital Stay (HOSPITAL_COMMUNITY): Payer: Medicare Other | Admitting: Certified Registered"

## 2014-01-04 ENCOUNTER — Encounter (HOSPITAL_COMMUNITY)
Admission: EM | Disposition: A | Discharge status: Home / Self Care | Payer: Self-pay | Source: Home / Self Care | Attending: Internal Medicine

## 2014-01-04 ENCOUNTER — Encounter (HOSPITAL_COMMUNITY): Payer: Self-pay | Admitting: *Deleted

## 2014-01-04 ENCOUNTER — Inpatient Hospital Stay (HOSPITAL_COMMUNITY): Payer: Medicare Other

## 2014-01-04 DIAGNOSIS — N401 Enlarged prostate with lower urinary tract symptoms: Secondary | ICD-10-CM | POA: Diagnosis present

## 2014-01-04 DIAGNOSIS — N138 Other obstructive and reflux uropathy: Secondary | ICD-10-CM | POA: Diagnosis present

## 2014-01-04 HISTORY — PX: TRANSURETHRAL RESECTION OF PROSTATE: SHX73

## 2014-01-04 HISTORY — PX: CIRCUMCISION: SHX1350

## 2014-01-04 LAB — GLUCOSE, CAPILLARY
Glucose-Capillary: 94 mg/dL (ref 70–99)
Glucose-Capillary: 127 mg/dL — ABNORMAL HIGH (ref 70–99)
Glucose-Capillary: 88 mg/dL (ref 70–99)
Glucose-Capillary: 131 mg/dL — ABNORMAL HIGH (ref 70–99)
Glucose-Capillary: 208 mg/dL — ABNORMAL HIGH (ref 70–99)

## 2014-01-04 LAB — PREPARE RBC (CROSSMATCH)

## 2014-01-04 SURGERY — TURP (TRANSURETHRAL RESECTION OF PROSTATE)
Anesthesia: General | Site: Prostate

## 2014-01-04 SURGERY — CYSTOSCOPY, WITH RETROGRADE PYELOGRAM
Anesthesia: Choice

## 2014-01-04 MED ORDER — METOPROLOL TARTRATE 1 MG/ML IV SOLN
INTRAVENOUS | Status: AC
  Filled 2014-01-04: qty 5

## 2014-01-04 MED ORDER — DEXTROSE 50 % IV SOLN
25.0000 g | Freq: Once | INTRAVENOUS | Status: AC
  Administered 2014-01-04: 25 g via INTRAVENOUS

## 2014-01-04 MED ORDER — CLONIDINE HCL 0.1 MG PO TABS
0.1000 mg | ORAL_TABLET | Freq: Once | ORAL | Status: AC
  Administered 2014-01-04: 0.1 mg via ORAL
  Filled 2014-01-04: qty 1

## 2014-01-04 MED ORDER — SUCCINYLCHOLINE CHLORIDE 20 MG/ML IJ SOLN
INTRAMUSCULAR | Status: DC | PRN
  Administered 2014-01-04: 120 mg via INTRAVENOUS

## 2014-01-04 MED ORDER — LIDOCAINE HCL (PF) 1 % IJ SOLN
INTRAMUSCULAR | Status: AC
  Filled 2014-01-04: qty 5

## 2014-01-04 MED ORDER — LIDOCAINE HCL (CARDIAC) 20 MG/ML IV SOLN
INTRAVENOUS | Status: DC | PRN
  Administered 2014-01-04: 50 mg via INTRAVENOUS

## 2014-01-04 MED ORDER — MIDAZOLAM HCL 2 MG/2ML IJ SOLN
1.0000 mg | INTRAMUSCULAR | Status: DC | PRN
  Administered 2014-01-04: 2 mg via INTRAVENOUS

## 2014-01-04 MED ORDER — LACTATED RINGERS IV SOLN
INTRAVENOUS | Status: DC | PRN
  Administered 2014-01-04 (×2): via INTRAVENOUS

## 2014-01-04 MED ORDER — LABETALOL HCL 5 MG/ML IV SOLN
INTRAVENOUS | Status: AC
  Filled 2014-01-04: qty 4

## 2014-01-04 MED ORDER — LABETALOL HCL 5 MG/ML IV SOLN
10.0000 mg | INTRAVENOUS | Status: DC | PRN
  Administered 2014-01-04: 10 mg via INTRAVENOUS

## 2014-01-04 MED ORDER — METOPROLOL TARTRATE 1 MG/ML IV SOLN
5.0000 mg | Freq: Once | INTRAVENOUS | Status: AC
  Administered 2014-01-04: 5 mg via INTRAVENOUS

## 2014-01-04 MED ORDER — SODIUM CHLORIDE 0.9 % IR SOLN
Status: DC | PRN
  Administered 2014-01-04: 3000 mL

## 2014-01-04 MED ORDER — ROCURONIUM BROMIDE 100 MG/10ML IV SOLN
INTRAVENOUS | Status: DC | PRN
  Administered 2014-01-04 (×2): 10 mg via INTRAVENOUS

## 2014-01-04 MED ORDER — SODIUM CHLORIDE 0.9 % IR SOLN
3000.0000 mL | Status: DC
  Administered 2014-01-04 – 2014-01-05 (×4): 3000 mL

## 2014-01-04 MED ORDER — BUPIVACAINE HCL (PF) 0.5 % IJ SOLN
INTRAMUSCULAR | Status: AC
  Filled 2014-01-04: qty 30

## 2014-01-04 MED ORDER — STERILE WATER FOR IRRIGATION IR SOLN
Status: DC | PRN
  Administered 2014-01-04: 1000 mL

## 2014-01-04 MED ORDER — FENTANYL CITRATE 0.05 MG/ML IJ SOLN: 25.0000 ug | INTRAMUSCULAR | Status: DC | PRN

## 2014-01-04 MED ORDER — METOPROLOL TARTRATE 1 MG/ML IV SOLN
INTRAVENOUS | Status: DC | PRN
  Administered 2014-01-04 (×5): 1 mg via INTRAVENOUS

## 2014-01-04 MED ORDER — GLYCOPYRROLATE 0.2 MG/ML IJ SOLN
INTRAMUSCULAR | Status: AC
  Filled 2014-01-04: qty 1

## 2014-01-04 MED ORDER — HYDROMORPHONE HCL PF 1 MG/ML IJ SOLN: 0.5000 mg | INTRAMUSCULAR | Status: DC | PRN

## 2014-01-04 MED ORDER — FENTANYL CITRATE 0.05 MG/ML IJ SOLN
INTRAMUSCULAR | Status: DC | PRN
  Administered 2014-01-04: 50 ug via INTRAVENOUS
  Administered 2014-01-04 (×2): 25 ug via INTRAVENOUS
  Administered 2014-01-04: 50 ug via INTRAVENOUS
  Administered 2014-01-04: 100 ug via INTRAVENOUS
  Administered 2014-01-04 (×2): 50 ug via INTRAVENOUS
  Administered 2014-01-04: 100 ug via INTRAVENOUS
  Administered 2014-01-04: 50 ug via INTRAVENOUS

## 2014-01-04 MED ORDER — GLYCOPYRROLATE 0.2 MG/ML IJ SOLN
INTRAMUSCULAR | Status: AC
  Filled 2014-01-04: qty 3

## 2014-01-04 MED ORDER — NEOSTIGMINE METHYLSULFATE 1 MG/ML IJ SOLN
INTRAMUSCULAR | Status: DC | PRN
  Administered 2014-01-04: 1 mg via INTRAVENOUS

## 2014-01-04 MED ORDER — HYDRALAZINE HCL 20 MG/ML IJ SOLN: 5.0000 mg | Freq: Once | INTRAMUSCULAR | Status: DC

## 2014-01-04 MED ORDER — ONDANSETRON HCL 4 MG/2ML IJ SOLN
INTRAMUSCULAR | Status: AC
  Filled 2014-01-04: qty 2

## 2014-01-04 MED ORDER — FENTANYL CITRATE 0.05 MG/ML IJ SOLN
INTRAMUSCULAR | Status: AC
  Filled 2014-01-04: qty 5

## 2014-01-04 MED ORDER — LACTATED RINGERS IV SOLN: INTRAVENOUS | Status: DC

## 2014-01-04 MED ORDER — DEXTROSE 50 % IV SOLN
INTRAVENOUS | Status: AC
  Filled 2014-01-04: qty 50

## 2014-01-04 MED ORDER — PROPOFOL 10 MG/ML IV BOLUS
INTRAVENOUS | Status: DC | PRN
  Administered 2014-01-04: 30 mg via INTRAVENOUS
  Administered 2014-01-04: 150 mg via INTRAVENOUS

## 2014-01-04 MED ORDER — ONDANSETRON HCL 4 MG/2ML IJ SOLN: 4.0000 mg | Freq: Once | INTRAMUSCULAR | Status: DC | PRN

## 2014-01-04 MED ORDER — PROPOFOL 10 MG/ML IV BOLUS
INTRAVENOUS | Status: AC
  Filled 2014-01-04: qty 20

## 2014-01-04 MED ORDER — CEFAZOLIN SODIUM 1-5 GM-% IV SOLN
INTRAVENOUS | Status: DC | PRN
  Administered 2014-01-04: 1 g via INTRAVENOUS

## 2014-01-04 MED ORDER — GLYCOPYRROLATE 0.2 MG/ML IJ SOLN
INTRAMUSCULAR | Status: DC | PRN
  Administered 2014-01-04 (×2): 0.2 mg via INTRAVENOUS

## 2014-01-04 MED ORDER — LABETALOL HCL 5 MG/ML IV SOLN
10.0000 mg | Freq: Once | INTRAVENOUS | Status: AC
  Administered 2014-01-04: 10 mg via INTRAVENOUS

## 2014-01-04 MED ORDER — BUPIVACAINE HCL (PF) 0.5 % IJ SOLN
INTRAMUSCULAR | Status: DC | PRN
  Administered 2014-01-04: 20 mL

## 2014-01-04 MED ORDER — LACTATED RINGERS IV SOLN: INTRAVENOUS | Status: DC | PRN

## 2014-01-04 MED ORDER — GLYCINE 1.5 % IR SOLN
Status: DC | PRN
  Administered 2014-01-04 (×6): 3000 mL

## 2014-01-04 MED ORDER — MIDAZOLAM HCL 2 MG/2ML IJ SOLN
INTRAMUSCULAR | Status: AC
  Filled 2014-01-04: qty 2

## 2014-01-04 MED ORDER — CEFAZOLIN SODIUM 1-5 GM-% IV SOLN
INTRAVENOUS | Status: AC
  Filled 2014-01-04: qty 50

## 2014-01-04 MED ORDER — ONDANSETRON HCL 4 MG/2ML IJ SOLN
4.0000 mg | Freq: Once | INTRAMUSCULAR | Status: AC
  Administered 2014-01-04: 4 mg via INTRAVENOUS

## 2014-01-04 MED ORDER — ARTIFICIAL TEARS OP OINT
TOPICAL_OINTMENT | OPHTHALMIC | Status: DC | PRN
  Administered 2014-01-04: 1 via OPHTHALMIC

## 2014-01-04 MED ORDER — ARTIFICIAL TEARS OP OINT
TOPICAL_OINTMENT | OPHTHALMIC | Status: AC
  Filled 2014-01-04: qty 3.5

## 2014-01-04 SURGICAL SUPPLY — 46 items
BAG DECANTER FOR FLEXI CONT (MISCELLANEOUS) ×4 IMPLANT
BAG DRAIN URO TABLE W/ADPT NS (DRAPE) ×4 IMPLANT
BAG DRN 8 ADPR NS SKTRN CSTL (DRAPE) ×3
BAG DRN URN TUBE DRIP CHMBR (OSTOMY) ×3
BAG HAMPER (MISCELLANEOUS) ×4 IMPLANT
BAG URINE DRAIN TURP 4L (OSTOMY) ×4 IMPLANT
BANDAGE CONFORM 2  STR LF (GAUZE/BANDAGES/DRESSINGS) ×8 IMPLANT
CABLE HI FREQUENCY MONOPOLAR (ELECTROSURGICAL) ×4 IMPLANT
CATH FOLEY 3WAY 30CC 22F (CATHETERS) ×4 IMPLANT
CLOTH BEACON ORANGE TIMEOUT ST (SAFETY) ×4 IMPLANT
COVER LIGHT HANDLE STERIS (MISCELLANEOUS) ×8 IMPLANT
DECANTER SPIKE VIAL GLASS SM (MISCELLANEOUS) ×8 IMPLANT
DRAPE STERI URO 23X35 APER SZ5 (DRAPE) ×4 IMPLANT
DRAPE WARM FLUID 44X44 (DRAPE) ×4 IMPLANT
ELECT CUT LOOP C-MAX 27FR .012 (CUTTING LOOP) ×4
ELECT REM PT RETURN 9FT ADLT (ELECTROSURGICAL) ×4
ELECTRODE CUT LP CMX 27FR .012 (CUTTING LOOP) ×3 IMPLANT
ELECTRODE REM PT RTRN 9FT ADLT (ELECTROSURGICAL) ×3 IMPLANT
ERX# 10323 IMPLANT
FORMALIN 10 PREFIL 120ML (MISCELLANEOUS) ×4 IMPLANT
FORMALIN 10 PREFIL 480ML (MISCELLANEOUS) ×4 IMPLANT
GAUZE PETROLATUM 1 X8 (GAUZE/BANDAGES/DRESSINGS) ×4 IMPLANT
GLOVE BIO SURGEON STRL SZ7 (GLOVE) ×4 IMPLANT
GLYCINE 1.5% IRRIG UROMATIC (IV SOLUTION) ×24 IMPLANT
GOWN STRL REUS W/TWL LRG LVL3 (GOWN DISPOSABLE) ×16 IMPLANT
IV NS IRRIG 3000ML ARTHROMATIC (IV SOLUTION) ×4 IMPLANT
KIT ROOM TURNOVER AP CYSTO (KITS) ×4 IMPLANT
MANIFOLD NEPTUNE II (INSTRUMENTS) ×4 IMPLANT
NEEDLE HYPO 25X1 1.5 SAFETY (NEEDLE) ×4 IMPLANT
NS IRRIG 1000ML POUR BTL (IV SOLUTION) ×4 IMPLANT
PACK CYSTO (CUSTOM PROCEDURE TRAY) ×4 IMPLANT
PACK MINOR (CUSTOM PROCEDURE TRAY) ×8 IMPLANT
PAD ARMBOARD 7.5X6 YLW CONV (MISCELLANEOUS) ×4 IMPLANT
SET BASIN LINEN APH (SET/KITS/TRAYS/PACK) ×4 IMPLANT
SET IRRIGATING DISP (SET/KITS/TRAYS/PACK) ×4 IMPLANT
SOL PREP PROV IODINE SCRUB 4OZ (MISCELLANEOUS) ×4 IMPLANT
SPONGE LAP 18X18 X RAY DECT (DISPOSABLE) ×4 IMPLANT
SPONGE LAP 4X18 X RAY DECT (DISPOSABLE) ×4 IMPLANT
STENT PERCUFLEX 4.8FRX24 (STENTS) IMPLANT
SUT CHROMIC 3 0 SH 27 (SUTURE) ×20 IMPLANT
SYR 30ML LL (SYRINGE) ×4 IMPLANT
SYR CONTROL 10ML LL (SYRINGE) ×4 IMPLANT
TOWEL OR 17X26 4PK STRL BLUE (TOWEL DISPOSABLE) ×4 IMPLANT
WATER STERILE IRR 1000ML POUR (IV SOLUTION) ×4 IMPLANT
WIRE GUIDE BENTSON .035 15CM (WIRE) IMPLANT
YANKAUER SUCT BULB TIP 10FT TU (MISCELLANEOUS) ×4 IMPLANT

## 2014-01-04 NOTE — Progress Notes (Signed)
Notified MD of elevated blood pressure and will follow orders and give 0.1 mg of Catapres.  Will continue to monitor patient.

## 2014-01-04 NOTE — Transfer of Care (Signed)
Immediate Anesthesia Transfer of Care Note  Patient: Zachary [name]  Procedure(s) Performed: Procedure(s) (LRB): TRANSURETHRAL RESECTION OF THE PROSTATE (TURP) (procedure #2) (N/A) CIRCUMCISION ADULT (procedure #1) (N/A)  Patient Location: PACU  Anesthesia Type: General  Level of Consciousness: awake  Airway & Oxygen Therapy: Patient Spontanous Breathing and nasal cannula  Post-op Assessment: Report given to PACU RN, Post -op Vital signs reviewed and stable and Patient moving all extremities  Post vital signs: Reviewed and stable  Complications: No apparent anesthesia complications

## 2014-01-04 NOTE — Anesthesia Preprocedure Evaluation (Signed)
Anesthesia Evaluation  Patient identified by MRN, date of birth, ID band Patient awake    Reviewed: Allergy & Precautions, H&P , NPO status , Patient's Chart, lab work & pertinent test results  Airway Mallampati: II TM Distance: >3 FB Neck ROM: Full    Dental  (+) Poor Dentition, Missing, Dental Advisory Given   Pulmonary neg pulmonary ROS,    Pulmonary exam normal       Cardiovascular hypertension, Pt. on medications Rhythm:Regular Rate:Normal     Neuro/Psych PSYCHIATRIC DISORDERS (mental retardation)    GI/Hepatic   Endo/Other  diabetes  Renal/GU Renal disease     Musculoskeletal   Abdominal   Peds  Hematology   Anesthesia Other Findings   Reproductive/Obstetrics                           Anesthesia Physical Anesthesia Plan  ASA: III  Anesthesia Plan: General   Post-op Pain Management:    Induction: Intravenous  Airway Management Planned: Oral ETT  Additional Equipment:   Intra-op Plan:   Post-operative Plan: Extubation in OR  Informed Consent: I have reviewed the patients History and Physical, chart, labs and discussed the procedure including the risks, benefits and alternatives for the proposed anesthesia with the patient or authorized representative who has indicated his/her understanding and acceptance.     Plan Discussed with:   Anesthesia Plan Comments: (Risk management [facility] has advised that the pt's brother may give consent for this procedure.)        Anesthesia Quick Evaluation

## 2014-01-04 NOTE — Brief Op Note (Signed)
12/25/2013 - 01/04/2014  2:21 PM  PATIENT:  Zachary [name]  68 y.o. male  PRE-OPERATIVE DIAGNOSIS:  Bilateral Hydronephrosis,Severe Phimosis, Benign Prostatic Hypertrophy  POST-OPERATIVE DIAGNOSIS:  Bilateral Hydronephrosis,Severe Phimosis, Benign Prostatic Hypertrophy  PROCEDURE:  Procedure(s): TRANSURETHRAL RESECTION OF THE PROSTATE (TURP) (procedure #2) (N/A) CIRCUMCISION ADULT (procedure #1) (N/A)  SURGEON:  Surgeon(s) and Role:    * Marissa Nestle, MD - Primary  PHYSICIAN ASSISTANT:   ASSISTANTS: none   ANESTHESIA:   epidural and general  EBL:  Total I/O In: 1300 [I.V.:1300] Out: 760 [Urine:750; Blood:10]  BLOOD ADMINISTERED:none  DRAINS: Urinary Catheter (Foley)   LOCAL MEDICATIONS USED:  MARCAINE     SPECIMEN:  Source of Specimen:  prostate chips and foreskun.  DISPOSITION OF SPECIMEN:  PATHOLOGY  COUNTS:  YES  TOURNIQUET:  * No tourniquets in log *  DICTATION: .Other Dictation: Dictation Number dictation [id number]  PLAN OF CARE: Admit to inpatient   PATIENT DISPOSITION:  PACU - hemodynamically stable.   Delay start of Pharmacological VTE agent (>24hrs) due to surgical blood loss or risk of bleeding:

## 2014-01-04 NOTE — Progress Notes (Signed)
Subjective: Patient is resting. He is scheduled for TRUP and circumcision today. No new complaint.  Objective: Vital signs in last 24 hours: Temp:  [98.5 F (36.9 C)-99.3 F (37.4 C)] 98.5 F (36.9 C) (03/11 0425) Pulse Rate:  [95-105] 97 (03/11 0425) Resp:  [20] 20 (03/11 0425) BP: (142-166)/(76-96) 142/76 mmHg (03/11 0425) SpO2:  [97 %-99 %] 97 % (03/11 0425) Weight change:  Last BM Date: 01/03/14  Intake/Output from previous day: 03/10 0701 - 03/11 0700 In: 2830 [P.O.:480; I.V.:2350] Out: 3200 [Urine:3200]  PHYSICAL EXAM General appearance: alert and no distress Resp: diminished breath sounds bilaterally and rhonchi bilaterally Cardio: S1, S2 normal GI: soft, non-tender; bowel sounds normal; no masses,  no organomegaly Extremities: extremities normal, atraumatic, no cyanosis or edema  Lab Results:    @labtest @ [facility] No results found for this basename: PHART, PCO2, PO2ART, TCO2, HCO3,  in the last 72 hours CULTURES Recent Results (from the past 240 hour(s))  RAPID STREP SCREEN     Status: None   Collection Time    12/26/13  2:39 AM      Result Value Ref Range Status   Streptococcus, Group A Screen (Direct) NEGATIVE  NEGATIVE Final   Comment: (NOTE)     A Rapid Antigen test may result negative if the antigen level in the     sample is below the detection level of this test. The FDA has not     cleared this test as a stand-alone test therefore the rapid antigen     negative result has reflexed to a Group A Strep culture.  CULTURE, GROUP A STREP     Status: None   Collection Time    12/26/13  2:39 AM      Result Value Ref Range Status   Specimen Description THROAT   Final   Special Requests NONE   Final   Culture     Final   Value: No Beta Hemolytic Streptococci Isolated     Performed at [facility]   Report Status 12/28/2013 FINAL   Final  SURGICAL PCR SCREEN     Status: None   Collection Time    12/28/13  5:20 PM      Result Value Ref Range Status    MRSA, PCR NEGATIVE  NEGATIVE Final   Staphylococcus aureus NEGATIVE  NEGATIVE Final   Comment:            The Xpert SA Assay (FDA     approved for NASAL specimens     in patients over 50 years of age),     is one component of     a comprehensive surveillance     program.  Test performance has     been validated by [employer] for patients greater     than or equal to 54 year old.     It is not intended     to diagnose infection nor to     guide or monitor treatment.   Studies/Results: No results found.  Medications: I have reviewed the patient's current medications.  Assesment: Active Problems:   DM (diabetes mellitus), type 2, uncontrolled   Mental retardation   Acute renal failure   Renal insufficiency   Dehydration   BPH (benign prostatic hypertrophy) Anaemia    Plan: Medications reviewed As per urology plan.    LOS: 10 days   Moneisha Vosler 01/04/2014, 7:53 AM

## 2014-01-04 NOTE — Progress Notes (Signed)
Zachary [name]  MRN: [id number]  DOB/AGE: 31-Mar-1946 68 y.o.  Primary Care Physician:FANTA,TESFAYE, MD  Admit date: 12/25/2013  Chief Complaint:  Chief Complaint  Patient presents with  . Cough  . Hyperglycemia    [name]-Pt presented on  12/25/2013 with  Chief Complaint  Patient presents with  . Cough  . Hyperglycemia  .    Pt offers no new complaints .   Pt awaiting procedure today.    Meds . enoxaparin (LOVENOX) injection  40 mg Subcutaneous Q24H  . insulin aspart  0-15 Units Subcutaneous TID WC  . insulin aspart  0-5 Units Subcutaneous QHS  . insulin aspart protamine- aspart  20 Units Subcutaneous BID WC  . levothyroxine  75 mcg Oral QAC breakfast  . niacin  1,000 mg Oral QHS  . simvastatin  40 mg Oral Daily  . tamsulosin  0.4 mg Oral Daily     Physical Exam: Vital signs in last 24 hours: Temp:  [98.5 F (36.9 C)-99.3 F (37.4 C)] 98.5 F (36.9 C) (03/11 0425) Pulse Rate:  [95-105] 97 (03/11 0425) Resp:  [20] 20 (03/11 0425) BP: (142-166)/(76-96) 142/76 mmHg (03/11 0425) SpO2:  [97 %-99 %] 97 % (03/11 0425) Weight change:  Last BM Date: 01/03/14  Intake/Output from previous day: 03/10 0701 - 03/11 0700 In: 2830 [P.O.:480; I.V.:2350] Out: 3200 [Urine:3200]     Physical Exam: General- pt is awake,alert, oriented to time place and person Resp- No acute REsp distress, CTA B/L NO Rhonchi CVS- S1S2 regular in rate and rhythm GIT- BS+, soft, NT, ND EXT- NO LE Edema, Cyanosis   Lab Results: CBC  Recent Labs  01/02/14 0455 01/03/14 0749  WBC 5.8 6.4  HGB 8.5* 10.6*  HCT 25.6* 31.3*  PLT 239 222    BMET  Recent Labs  01/03/14 0749  NA 143  K 4.2  CL 109  CO2 22  GLUCOSE 91  BUN 11  CREATININE 1.67*  CALCIUM 9.0   Trend Creat 2015   3.23==>1.67  MICRO Recent Results (from the past 240 hour([name]))  RAPID STREP SCREEN     Status: None   Collection Time    12/26/13  2:39 AM      Result Value Ref Range Status   Streptococcus, Group A Screen  (Direct) NEGATIVE  NEGATIVE Final   Comment: (NOTE)     A Rapid Antigen test may result negative if the antigen level in the     sample is below the detection level of this test. The FDA has not     cleared this test as a stand-alone test therefore the rapid antigen     negative result has reflexed to a Group A Strep culture.  CULTURE, GROUP A STREP     Status: None   Collection Time    12/26/13  2:39 AM      Result Value Ref Range Status   Specimen Description THROAT   Final   Special Requests NONE   Final   Culture     Final   Value: No Beta Hemolytic Streptococci Isolated     Performed at [facility]   Report Status 12/28/2013 FINAL   Final  SURGICAL PCR SCREEN     Status: None   Collection Time    12/28/13  5:20 PM      Result Value Ref Range Status   MRSA, PCR NEGATIVE  NEGATIVE Final   Staphylococcus aureus NEGATIVE  NEGATIVE Final   Comment:  The Xpert SA Assay (FDA     approved for NASAL specimens     in patients over 91 years of age),     is one component of     a comprehensive surveillance     program.  Test performance has     been validated by [employer] for patients greater     than or equal to 32 year old.     It is not intended     to diagnose infection nor to     guide or monitor treatment.      Lab Results  Component Value Date   PTH 78.7* 12/26/2013   CALCIUM 9.0 01/03/2014   CAION 1.17 12/25/2013   PHOS 3.3 01/03/2014          Renal u/[name] 1. Moderate bilateral hydronephrosis. There is also evidence of  hydroureter bilaterally. No obstructing masses nor appreciable  calculi identified. Clinical correlation recommended and if  clinically warranted further evaluation with renal protocol CT is  recommended.  2. Trabeculated bladder wall and enlarged prostate. These findings  may reflect sequela of a component of bladder outlet obstruction.  Clinical correlation recommended.     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 Secondary Hyperparathyroidism  present . Phosphorus at goal.   5)CNS-Hx of mental Retardation  PMD following  6)Electrolytes Normokalemic NOrmonatremic  7) Urology - Pt with b/l hydronephrosis     Urology involved.Pt to have procedure today  7)Acid base Co2 at goal     Plan:  Will continue current  care will follow Bmet     Zachary [name] 01/04/2014, 9:26 AM

## 2014-01-04 NOTE — Preoperative (Signed)
Beta Blockers   Reason not to administer Beta Blockers:Not Applicable 

## 2014-01-04 NOTE — Anesthesia Postprocedure Evaluation (Signed)
Anesthesia Post Note  Patient: Zachary [name]  Procedure(s) Performed: Procedure(s) (LRB): TRANSURETHRAL RESECTION OF THE PROSTATE (TURP) (procedure #2) (N/A) CIRCUMCISION ADULT (procedure #1) (N/A)  Anesthesia type: General  Patient location: PACU  Post pain: Pain level controlled  Post assessment: Post-op Vital signs reviewed, Patient's Cardiovascular Status Stable, Respiratory Function Stable, Patent Airway, No signs of Nausea or vomiting and Pain level controlled  Last Vitals:  Filed Vitals:   01/04/14 1438  BP:   Pulse: 89  Temp: 36.4 C  Resp: 14    Post vital signs: Reviewed and stable  Level of consciousness: awake and alert   Complications: No apparent anesthesia complications

## 2014-01-04 NOTE — Anesthesia Procedure Notes (Signed)
Procedure Name: Intubation Date/Time: 01/04/2014 12:19 PM Performed by: Andree Elk, Dariah Mcsorley A Pre-anesthesia Checklist: Patient identified, Timeout performed, Emergency Drugs available, Suction available and Patient being monitored Patient Re-evaluated:Patient Re-evaluated prior to inductionOxygen Delivery Method: Circle system utilized Preoxygenation: Pre-oxygenation with 100% oxygen Intubation Type: IV induction Ventilation: Mask ventilation without difficulty Grade View: Grade II Number of attempts: 2 Airway Equipment and Method: Video-laryngoscopy Placement Confirmation: positive ETCO2 and breath sounds checked- equal and bilateral Secured at: 25 cm Tube secured with: Tape Dental Injury: Teeth and Oropharynx as per pre-operative assessment  Comments: Patients teeth extremely loose making laryngoscopy somewhat difficult; Intubated on 2nd attempt using glidescope by Dr. Duwayne Heck. Patient easy mask and VSS throughout.

## 2014-01-04 NOTE — Progress Notes (Signed)
Urine cs in dec 2012 no growth will use ancef 1 gram iv as prophylaxis.

## 2014-01-05 ENCOUNTER — Encounter (HOSPITAL_COMMUNITY): Payer: Self-pay | Admitting: Urology

## 2014-01-05 LAB — CBC
HCT: 30.7 % — ABNORMAL LOW (ref 39.0–52.0)
Hemoglobin: 10.5 g/dL — ABNORMAL LOW (ref 13.0–17.0)
MCH: 31.5 pg (ref 26.0–34.0)
MCHC: 34.2 g/dL (ref 30.0–36.0)
MCV: 92.2 fL (ref 78.0–100.0)
Platelets: 238 10*3/uL (ref 150–400)
RBC: 3.33 MIL/uL — ABNORMAL LOW (ref 4.22–5.81)
RDW: 14.5 % (ref 11.5–15.5)
WBC: 8.2 10*3/uL (ref 4.0–10.5)

## 2014-01-05 LAB — BASIC METABOLIC PANEL
BUN: 10 mg/dL (ref 6–23)
CO2: 26 mEq/L (ref 19–32)
Calcium: 8.8 mg/dL (ref 8.4–10.5)
Chloride: 107 mEq/L (ref 96–112)
Creatinine, Ser: 1.65 mg/dL — ABNORMAL HIGH (ref 0.50–1.35)
GFR calc Af Amer: 49 mL/min — ABNORMAL LOW (ref >90)
GFR calc non Af Amer: 42 mL/min — ABNORMAL LOW (ref >90)
Glucose, Bld: 153 mg/dL — ABNORMAL HIGH (ref 70–99)
Potassium: 4.1 mEq/L (ref 3.7–5.3)
Sodium: 143 mEq/L (ref 137–147)

## 2014-01-05 LAB — GLUCOSE, CAPILLARY
Glucose-Capillary: 127 mg/dL — ABNORMAL HIGH (ref 70–99)
Glucose-Capillary: 141 mg/dL — ABNORMAL HIGH (ref 70–99)
Glucose-Capillary: 244 mg/dL — ABNORMAL HIGH (ref 70–99)
Glucose-Capillary: 164 mg/dL — ABNORMAL HIGH (ref 70–99)

## 2014-01-05 MED ORDER — SODIUM CHLORIDE 0.9 % IR SOLN: 3000.0000 mL | Status: DC

## 2014-01-05 MED ORDER — FUROSEMIDE 10 MG/ML IJ SOLN
40.0000 mg | Freq: Once | INTRAMUSCULAR | Status: AC
  Administered 2014-01-05: 40 mg via INTRAVENOUS
  Filled 2014-01-05: qty 4

## 2014-01-05 NOTE — Progress Notes (Signed)
Subjective: Interval History: has no complaint . No nausea or vomiting  Objective: Vital signs in last 24 hours: Temp:  [97.1 F (36.2 C)-98.7 F (37.1 C)] 98.6 F (37 C) (03/12 0456) Pulse Rate:  [83-113] 92 (03/12 0456) Resp:  [8-23] 20 (03/12 0456) BP: (141-195)/(83-109) 145/83 mmHg (03/12 0456) SpO2:  [95 %-99 %] 98 % (03/12 0456) Weight change:   Intake/Output from previous day: 03/11 0701 - 03/12 0700 In: 13493.3 [I.V.:2493.3] Out: 20560 [Urine:20550; Blood:10] Intake/Output this shift:    Generally he is alert in no apparent distress Chest is clear Heart RRR  Abdomen none tender and positive bowl sound Extremities 1+ edema   Lab Results:  Recent Labs  01/03/14 0749  WBC 6.4  HGB 10.6*  HCT 31.3*  PLT 222   BMET:   Recent Labs  01/03/14 0749  NA 143  K 4.2  CL 109  CO2 22  GLUCOSE 91  BUN 11  CREATININE 1.67*  CALCIUM 9.0   No results found for this basename: PTH,  in the last 72 hours Iron Studies:  No results found for this basename: IRON, TIBC, TRANSFERRIN, FERRITIN,  in the last 72 hours  Studies/Results: No results found.  I have reviewed the patient's current medications.  Assessment/Plan: Problem #1 renal failure: This seems to be acute on chronic. His BUN and creatinine presently is improving and patient is none oliguric. He has no uremic signs and symptoms. Possibly his base line Problem #2 chronic renal failure: Possibly stage III and most likely secondary to focal segmental nephrosclerosis. His renal function returned  to his baseline. Problem #3 diabetes Problem #4 history of BPH. S/P TURP Problem #5 mental retardation Problem #6 history of pyelonephritis and perinephric abscess Problem #7 metabolic bone disease: His calcium and phosphorus is in range Problem #8 anemia: Iron saturation of 6% and ferritin of 59. Hence patient has iron deficiency anemia. His hemoglobin and hematocrit has declined. Plan: We'll D/C iv We'll check  his basic metabolic panel CBC and phosphorus in the morning.   LOS: 11 days   Glorianne Proctor S 01/05/2014,7:50 AM

## 2014-01-05 NOTE — Progress Notes (Signed)
Subjective: Patient is resting. He had TURP and circumcision yesterday. Patient has no new complaint.  Objective: Vital signs in last 24 hours: Temp:  [97.1 F (36.2 C)-98.7 F (37.1 C)] 98.6 F (37 C) (03/12 0456) Pulse Rate:  [83-113] 92 (03/12 0456) Resp:  [8-23] 20 (03/12 0456) BP: (141-195)/(83-109) 145/83 mmHg (03/12 0456) SpO2:  [95 %-99 %] 98 % (03/12 0456) Weight change:  Last BM Date: 01/04/14  Intake/Output from previous day: 03/11 0701 - 03/12 0700 In: 13493.3 [I.V.:2493.3] Out: 20560 [Urine:20550; Blood:10]  PHYSICAL EXAM General appearance: alert and no distress Resp: diminished breath sounds bilaterally and rhonchi bilaterally Cardio: S1, S2 normal GI: soft, non-tender; bowel sounds normal; no masses,  no organomegaly Extremities: extremities normal, atraumatic, no cyanosis or edema  Lab Results:    @labtest @ [facility] No results found for this basename: PHART, PCO2, PO2ART, TCO2, HCO3,  in the last 72 hours CULTURES Recent Results (from the past 240 hour(s))  SURGICAL PCR SCREEN     Status: None   Collection Time    12/28/13  5:20 PM      Result Value Ref Range Status   MRSA, PCR NEGATIVE  NEGATIVE Final   Staphylococcus aureus NEGATIVE  NEGATIVE Final   Comment:            The Xpert SA Assay (FDA     approved for NASAL specimens     in patients over 81 years of age),     is one component of     a comprehensive surveillance     program.  Test performance has     been validated by [employer] for patients greater     than or equal to 29 year old.     It is not intended     to diagnose infection nor to     guide or monitor treatment.   Studies/Results: No results found.  Medications: I have reviewed the patient's current medications.  Assesment: Active Problems:   DM (diabetes mellitus), type 2, uncontrolled   Mental retardation   Acute renal failure   Renal insufficiency   Dehydration   BPH (benign prostatic hypertrophy)   BPH  (benign prostatic hypertrophy) with urinary obstruction Anaemia    Plan: Medications reviewed Cbc/BMP  As per urology plan.    LOS: 11 days   Dvonte Gatliff 01/05/2014, 7:49 AM

## 2014-01-05 NOTE — Progress Notes (Signed)
Afebrile feels fine cbi is clear dressing looks clean plan oob d/c cbi reg diet .

## 2014-01-05 NOTE — Op Note (Signed)
NAMESOLAN, LAMMERS NO.:  [id number]  MEDICAL RECORD NO.:  [id number]  LOCATION:                                 FACILITY:  PHYSICIAN:  Marissa Nestle, M.D.DATE OF BIRTH:  05/21/1966  DATE OF PROCEDURE:  01/04/2014 DATE OF DISCHARGE:                              OPERATIVE REPORT   PREOPERATIVE DIAGNOSIS:  Phimosis, benign prostatic hypertrophy, and urinary retention.  POSTOPERATIVE DIAGNOSIS:  Phimosis, benign prostatic hypertrophy, and urinary retention.  PROCEDURE:  TUR prostate and circumcision.  ANESTHESIA:  General.  DESCRIPTION OF PROCEDURE:  The patient under general endotracheal anesthesia in supine position after usual prep and drape, I proceeded to do the circumcision and a dorsal slit was made.  Glans penis was exposed.  It was prepped with Betadine.  Redundant prepuce was circumferentially excised leaving about 5 mm mucosa.  Bleeders were thoroughly coagulated.  After making sure there was complete hemostasis, skin and mucosa were closed together with 3-0 chromic interrupted suture.  At the end, the base of the penis infiltrated with about 20 mL of 1% Marcaine and the wound was wrapped in Vaseline gauze and 2-inch Kling.  Then, he was placed in lithotomy position to do TUR prostate. After usual prep and drape, the [employer] sounds up to 30-French was introduced and then resectoscope #28 was introduced into the bladder. It was inspected.  I could not see the orifice as he has a median lobe covering the orifices.  Resectoscope was pulled back in mid prostatic urethra.  The median lobe resected to that level.  Then, the bladder neck was circumferentially resected down to the circular fibers.  After resecting the bladder neck, the resectoscope was pulled back at the level of the verumontanum.  Resection of the right lobe and the left lobe then was done between 1 and 5 o'clock and 7 and 11 o'clock position on the right side.  There was  hardly any tissue in the anterior midline which was resected very carefully.  The posterior midline tissue was resected with the apical tissue very carefully not to injure the sphincter or the verumontanum.  At the end, looking from the verumontanum, bladder neck was wide open.  Chips were evacuated. Bleeders were coagulated.  Then, I tried to find the orifices again, I could not find it.  He has a well-developed median bar which was covering the orifices, so I decided not to do any retrograde pyelogram.  After making sure all the chips were out, resectoscope was removed, 22 three- way Foley catheter left in for drainage.  CBI started, which was clear. The patient left the operating room in satisfactory condition.     Marissa Nestle, M.D.     MIJ/MEDQ  D:  01/04/2014  T:  01/05/2014  Job:  [id number]

## 2014-01-05 NOTE — Progress Notes (Signed)
Patient has generalized edema upon assessment this morning.  Notified MD and will follow orders- 40 mg of Lasix IV once and NS at [facility].  Will continue to monitor patient.

## 2014-01-05 NOTE — Addendum Note (Signed)
Addendum created 01/05/14 1130 by Charmaine Downs, CRNA   Modules edited: Notes Section   Notes Section:  File: [id number]

## 2014-01-05 NOTE — Progress Notes (Signed)
Lab was unable to draw patient's morning labs due to generalized edema.  Rescheduled labs for later this AM.

## 2014-01-05 NOTE — Anesthesia Postprocedure Evaluation (Signed)
  Anesthesia Post-op Note  Patient: Zachary [name]  Procedure(s) Performed: Procedure(s): TRANSURETHRAL RESECTION OF THE PROSTATE (TURP) (procedure #2) (N/A) CIRCUMCISION ADULT (procedure #1) (N/A)  Patient Location: room 307  Anesthesia Type:General  Level of Consciousness: awake, alert  and patient cooperative  Airway and Oxygen Therapy: Patient Spontanous Breathing  Post-op Pain: none  Post-op Assessment: Post-op Vital signs reviewed, Patient's Cardiovascular Status Stable, Respiratory Function Stable, Patent Airway, No signs of Nausea or vomiting, Adequate PO intake and Pain level controlled  Post-op Vital Signs: Reviewed and stable  Complications: No apparent anesthesia complications

## 2014-01-06 LAB — CBC
HCT: 29.5 % — ABNORMAL LOW (ref 39.0–52.0)
Hemoglobin: 10 g/dL — ABNORMAL LOW (ref 13.0–17.0)
MCH: 31.7 pg (ref 26.0–34.0)
MCHC: 33.9 g/dL (ref 30.0–36.0)
MCV: 93.7 fL (ref 78.0–100.0)
Platelets: 217 10*3/uL (ref 150–400)
RBC: 3.15 MIL/uL — ABNORMAL LOW (ref 4.22–5.81)
RDW: 14.5 % (ref 11.5–15.5)
WBC: 7.8 10*3/uL (ref 4.0–10.5)

## 2014-01-06 LAB — BASIC METABOLIC PANEL
BUN: 10 mg/dL (ref 6–23)
CO2: 24 mEq/L (ref 19–32)
Calcium: 8.6 mg/dL (ref 8.4–10.5)
Chloride: 111 mEq/L (ref 96–112)
Creatinine, Ser: 1.68 mg/dL — ABNORMAL HIGH (ref 0.50–1.35)
GFR calc Af Amer: 48 mL/min — ABNORMAL LOW (ref >90)
GFR calc non Af Amer: 41 mL/min — ABNORMAL LOW (ref >90)
Glucose, Bld: 115 mg/dL — ABNORMAL HIGH (ref 70–99)
Potassium: 4.1 mEq/L (ref 3.7–5.3)
Sodium: 144 mEq/L (ref 137–147)

## 2014-01-06 LAB — TYPE AND SCREEN
ABO/RH(D): B POS
Antibody Screen: NEGATIVE
Unit division: 0
Unit division: 0
Unit division: 0
Unit division: 0

## 2014-01-06 LAB — GLUCOSE, CAPILLARY
Glucose-Capillary: 141 mg/dL — ABNORMAL HIGH (ref 70–99)
Glucose-Capillary: 129 mg/dL — ABNORMAL HIGH (ref 70–99)
Glucose-Capillary: 96 mg/dL (ref 70–99)

## 2014-01-06 MED ORDER — SODIUM CHLORIDE 0.9 % IV SOLN
125.0000 mg | Freq: Once | INTRAVENOUS | Status: AC
  Administered 2014-01-06: 125 mg via INTRAVENOUS
  Filled 2014-01-06: qty 10

## 2014-01-06 NOTE — Progress Notes (Signed)
Doing fine temp 67F b/p 152/86 urine is almost clear path pending.Marland Kitchen Hct29.5 na 144 k 4.1  Impression satisfactory post op course.

## 2014-01-06 NOTE — Progress Notes (Signed)
Subjective: Patient feels better. He has foley catheter. No new  complaint.  Objective: Vital signs in last 24 hours: Temp:  [98 F (36.7 C)-99.3 F (37.4 C)] 99.3 F (37.4 C) (03/13 0459) Pulse Rate:  [92-100] 98 (03/13 0459) Resp:  [18-20] 20 (03/13 0459) BP: (146-152)/(80-86) 152/86 mmHg (03/13 0459) SpO2:  [97 %-98 %] 98 % (03/13 0459) Weight change:  Last BM Date: 01/04/14  Intake/Output from previous day: 03/12 0701 - 03/13 0700 In: 600 [P.O.:600] Out: 4850 [Urine:4850]  PHYSICAL EXAM General appearance: alert and no distress Resp: diminished breath sounds bilaterally and rhonchi bilaterally Cardio: S1, S2 normal GI: soft, non-tender; bowel sounds normal; no masses,  no organomegaly Extremities: extremities normal, atraumatic, no cyanosis or edema  Lab Results:    @labtest @ [facility] No results found for this basename: PHART, PCO2, PO2ART, TCO2, HCO3,  in the last 72 hours CULTURES Recent Results (from the past 240 hour(s))  SURGICAL PCR SCREEN     Status: None   Collection Time    12/28/13  5:20 PM      Result Value Ref Range Status   MRSA, PCR NEGATIVE  NEGATIVE Final   Staphylococcus aureus NEGATIVE  NEGATIVE Final   Comment:            The Xpert SA Assay (FDA     approved for NASAL specimens     in patients over 12 years of age),     is one component of     a comprehensive surveillance     program.  Test performance has     been validated by [employer] for patients greater     than or equal to 16 year old.     It is not intended     to diagnose infection nor to     guide or monitor treatment.   Studies/Results: No results found.  Medications: I have reviewed the patient's current medications.  Assesment: Active Problems:   DM (diabetes mellitus), type 2, uncontrolled   Mental retardation   Acute renal failure   Renal insufficiency   Dehydration   BPH (benign prostatic hypertrophy)   BPH (benign prostatic hypertrophy) with urinary  obstruction Anaemia    Plan: Medications reviewed Cbc/BMP  As per urology plan.    LOS: 12 days   Zachary [name] 01/06/2014, 8:07 AM

## 2014-01-06 NOTE — Progress Notes (Signed)
Subjective: Interval History: has no complaint .Denies any difficulty in breathing  Objective: Vital signs in last 24 hours: Temp:  [98 F (36.7 C)-99.3 F (37.4 C)] 99.3 F (37.4 C) (03/13 0459) Pulse Rate:  [92-100] 98 (03/13 0459) Resp:  [18-20] 20 (03/13 0459) BP: (146-152)/(80-86) 152/86 mmHg (03/13 0459) SpO2:  [97 %-98 %] 98 % (03/13 0459) Weight change:   Intake/Output from previous day: 03/12 0701 - 03/13 0700 In: 600 [P.O.:600] Out: 4850 [Urine:4850] Intake/Output this shift:    Generally he is alert in no apparent distress Chest is clear Heart RRR  Abdomen none tender and positive bowl sound Extremities trace  edema   Lab Results:  Recent Labs  01/05/14 0839 01/06/14 0455  WBC 8.2 7.8  HGB 10.5* 10.0*  HCT 30.7* 29.5*  PLT 238 217   BMET:   Recent Labs  01/05/14 0839 01/06/14 0455  NA 143 144  K 4.1 4.1  CL 107 111  CO2 26 24  GLUCOSE 153* 115*  BUN 10 10  CREATININE 1.65* 1.68*  CALCIUM 8.8 8.6   No results found for this basename: PTH,  in the last 72 hours Iron Studies:  No results found for this basename: IRON, TIBC, TRANSFERRIN, FERRITIN,  in the last 72 hours  Studies/Results: No results found.  I have reviewed the patient's current medications.  Assessment/Plan: Problem #1 renal failure: This seems to be acute on chronic. 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 His renal function returned  to his baseline. Problem #3 diabetes Problem #4 history of BPH. 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 Patient has received one dose of iv iron on 01/02/2014 His hemoglobin and hematocrit has declined. Plan: We will give a second dose of iv iron today I will Follow  patient as out patient and sign off . Thank you    LOS: 12 days   Laqueta Bonaventura S 01/06/2014,7:37 AM

## 2014-01-07 LAB — GLUCOSE, CAPILLARY
Glucose-Capillary: 108 mg/dL — ABNORMAL HIGH (ref 70–99)
Glucose-Capillary: 100 mg/dL — ABNORMAL HIGH (ref 70–99)
Glucose-Capillary: 173 mg/dL — ABNORMAL HIGH (ref 70–99)

## 2014-01-07 NOTE — Progress Notes (Signed)
Patient had foley removed this am and has since voided approximately 323mL of clear amber urine. Spoke with Dr Michela Pitcher, ok to go home and follow-up in his office.  Notified Dr Legrand Rams of this, ok to discharge patient as planned.

## 2014-01-07 NOTE — Progress Notes (Signed)
Subjective: Patient feels better. His foley catheter is removed. Patient is planned to be discharged if he is able to void.  Objective: Vital signs in last 24 hours: Temp:  [98 F (36.7 C)-99 F (37.2 C)] 99 F (37.2 C) (03/14 0500) Pulse Rate:  [95-103] 95 (03/14 0500) Resp:  [20] 20 (03/14 0500) BP: (157-163)/(86-91) 157/86 mmHg (03/14 0500) SpO2:  [97 %-99 %] 97 % (03/14 0500) Weight change:  Last BM Date: 01/05/14  Intake/Output from previous day: 03/13 0701 - 03/14 0700 In: 480 [P.O.:480] Out: 2754 [Urine:2754]  PHYSICAL EXAM General appearance: alert and no distress Resp: diminished breath sounds bilaterally and rhonchi bilaterally Cardio: S1, S2 normal GI: soft, non-tender; bowel sounds normal; no masses,  no organomegaly Extremities: extremities normal, atraumatic, no cyanosis or edema  Lab Results:    @labtest @ [facility] No results found for this basename: PHART, PCO2, PO2ART, TCO2, HCO3,  in the last 72 hours CULTURES Recent Results (from the past 240 hour(s))  SURGICAL PCR SCREEN     Status: None   Collection Time    12/28/13  5:20 PM      Result Value Ref Range Status   MRSA, PCR NEGATIVE  NEGATIVE Final   Staphylococcus aureus NEGATIVE  NEGATIVE Final   Comment:            The Xpert SA Assay (FDA     approved for NASAL specimens     in patients over 47 years of age),     is one component of     a comprehensive surveillance     program.  Test performance has     been validated by [employer] for patients greater     than or equal to 53 year old.     It is not intended     to diagnose infection nor to     guide or monitor treatment.   Studies/Results: No results found.  Medications: I have reviewed the patient's current medications.  Assesment: Active Problems:   DM (diabetes mellitus), type 2, uncontrolled   Mental retardation   Acute renal failure   Renal insufficiency   Dehydration   BPH (benign prostatic hypertrophy)   BPH (benign  prostatic hypertrophy) with urinary obstruction Anaemia    Plan: Medications reviewed Continue regular medications As per urology plan.    LOS: 13 days   Zachary [name] 01/07/2014, 9:08 AM

## 2014-01-07 NOTE — Progress Notes (Signed)
IV removed, site WNL.  Pt given d/c instructions and new prescriptions.  Discussed all home medications (when, how, and why to take), patient verbalizes understanding. Discussed home care with patient (incision care, cleaning, dressing)  teachback completed. F/U appointment will need to be made by patient, phone numbers of offices and when to call listed on d/c instructions. Pt states he will make and keep appointment. Pt is stable at this time. I have spoken with Delfino Lovett, pt's brother, who states he will come pick patient up to transport home. Pt voided since removal of catheter with no difficulty.

## 2014-01-09 NOTE — Progress Notes (Signed)
UR chart review completed.  

## 2014-01-12 MED FILL — Iohexol IV Soln 350 MG/ML: INTRAVENOUS | Qty: 50 | Status: AC

## 2014-01-12 NOTE — Discharge Summary (Signed)
Physician Discharge Summary  Patient ID: Zachary [name] MRN: [id number] DOB/AGE: 66-Nov-1947 66 y.o. Primary Care Physician:Rylei Codispoti, MD Admit date: 12/25/2013 Discharge date: 01/12/2014    Discharge Diagnoses:   Active Problems:   DM (diabetes mellitus), type 2, uncontrolled   Mental retardation   Acute renal failure   Renal insufficiency   Dehydration   BPH (benign prostatic hypertrophy)   BPH (benign prostatic hypertrophy) with urinary obstruction     Medication List         insulin aspart protamine- aspart (70-30) 100 UNIT/ML injection  Commonly known as:  NOVOLOG MIX 70/30  Inject 40 Units into the skin 2 (two) times daily.     levothyroxine 75 MCG tablet  Commonly known as:  SYNTHROID, LEVOTHROID  Take 75 mcg by mouth daily.     linagliptin 5 MG Tabs tablet  Commonly known as:  TRADJENTA  Take 5 mg by mouth daily.     niacin 1000 MG CR tablet  Commonly known as:  NIASPAN  Take 1,000 mg by mouth at bedtime.     simvastatin 40 MG tablet  Commonly known as:  ZOCOR  Take 40 mg by mouth daily.        Discharged Condition: improved    Consults: urology and nephrology  Significant Diagnostic Studies: Dg Chest 2 View  12/25/2013   CLINICAL DATA:  Chronic cough, congestion  EXAM: CHEST  2 VIEW  COMPARISON:  10/03/2011  FINDINGS: Cardiomediastinal silhouette is stable. No acute infiltrate or pleural effusion. No pulmonary edema. Bony thorax is unremarkable.  IMPRESSION: No active cardiopulmonary disease.   Electronically Signed   By: Lahoma Crocker M.D.   On: 12/25/2013 17:11   US Renal  12/30/2013   CLINICAL DATA:  Hydronephrosis, history of left-sided perinephric abscess  EXAM: RENAL/URINARY TRACT ULTRASOUND COMPLETE  COMPARISON:  US RENAL dated 12/26/2013; CT ABD/PELVIS W CM dated 10/05/2011  FINDINGS: Right Kidney:  Length: 12.3 cm. Adjacent to the mid upper pole cortex a hypoechoic focus is demonstrated which may reflect a small amount of perinephric fluid. There  is moderate hydronephrosis on the right with proximal hydroureter.  Left Kidney:  Length: 11.1 cm. The echotexture of the renal parenchyma is normal with exception of a cyst in the midpole measuring 2.7 x 2.3 x 2.5 cm. There is moderate hydronephrosis and proximal hydroureter.  Bladder:  Again demonstrated is trabeculation of the urinary bladder wall. Soft tissue echotexture irregular masslike density is present within the bladder lumen but is not clearly new. A ureteral jet on the left is demonstrated.  IMPRESSION: 1. There remains moderate bilateral hydronephrosis and hydroureter. A small amount of perinephric fluid on the right is suspected. No discrete renal abscess is demonstrated. A cyst in the midpole of the left kidney is present. 2. Urinary bladder trabeculation is present. I cannot exclude a mucosal bladder mass. There is enlargement of the prostate gland. 3. A triphasic abdominal and pelvic CT scan is recommended to further evaluate the kidneys and ureters and urinary bladder.   Electronically Signed   By: David  Martinique   On: 12/30/2013 15:51   US Renal  12/26/2013   CLINICAL DATA:  Acute renal failure  EXAM: RENAL/URINARY TRACT ULTRASOUND COMPLETE  COMPARISON:  US RENAL dated 10/09/2011; CT ABD/PELVIS W CM dated 10/05/2011  FINDINGS: Right Kidney:  Length: 11.7 cm. There has been interval development of moderate hydronephrosis when compared to the previous study. There is no evidence of solid nor cystic masses, no evidence of cortical thinning.  There does appear to be appropriate corticomedullary differentiation though there is slight increased echogenicity of the renal cortex. There is mild to moderate hydroureter within the visualized proximal right ureter.  Left Kidney:  Length: 11.6 cm. Moderate hydronephrosis is appreciated within the left kidney which is also developed in the interim. A stable 2 x 2.5 x 2 6 cm benign-appearing cyst is appreciated within the left kidney. Hydroureter is appreciated  within the visualized portion of the left ureter. Corticomedullary differentiation is preserved with no there is mild increased echogenicity of the renal cortex. The previous described perinephric fluid collection is not clearly identified on the present study. There does appear to be an area of focal cortical thinning in this region consistent with an area of scarring.  Bladder:  The urinary bladder demonstrates mild trabeculation. A left ureteral jet is appreciated. A right ureteral jet is not clearly identified an equivocal finding. The prostate is enlarged with a volume of 52 cc.  IMPRESSION: 1. Moderate bilateral hydronephrosis. There is also evidence of hydroureter bilaterally. No obstructing masses nor appreciable calculi identified. Clinical correlation recommended and if clinically warranted further evaluation with renal protocol CT is recommended. 2. Trabeculated bladder wall and enlarged prostate. These findings may reflect sequela of a component of bladder outlet obstruction. Clinical correlation recommended.   Electronically Signed   By: Margaree Mackintosh M.D.   On: 12/26/2013 09:56    Lab Results: Basic Metabolic Panel: No results found for this basename: NA, K, CL, CO2, GLUCOSE, BUN, CREATININE, CALCIUM, MG, PHOS,  in the last 72 hours Liver Function Tests: No results found for this basename: AST, ALT, ALKPHOS, BILITOT, PROT, ALBUMIN,  in the last 72 hours   CBC: No results found for this basename: WBC, NEUTROABS, HGB, HCT, MCV, PLT,  in the last 72 hours  No results found for this or any previous visit (from the past 240 hour(s)).   Hospital Course:  This is a 68 years old male who was admitted due to acute renal failure due obstructive urropathy. Urology and nephrology was done. Patient underwent TRUP and circumcision. His surgery was uneventfully. He improve and discharged in stable condition.   Discharge Exam: Blood pressure 157/86, pulse 95, temperature 99 F (37.2 C),  temperature source Oral, resp. rate 20, height 5\' 9"  (1.753 m), weight 89.7 kg (197 lb 12 oz), SpO2 97.00%.   Disposition:  home        Follow-up Information   Follow up with [facility] S, MD In 4 weeks.   Specialty:  Nephrology   Contact information:   [address] [address] [phone]       Follow up with Jaidy Cottam, MD In 1 week.   Specialty:  Internal Medicine   Contact information:   [place] [address] [phone]       Follow up with Beaulah Dinning I, MD. Schedule an appointment as soon as possible for a visit in 1 week. (For suture removal)    Specialty:  Urology   Contact information:   [address] [place] [id number] [phone]       Signed: [name]   01/12/2014, 8:25 PM

## 2014-01-26 DIAGNOSIS — E559 Vitamin D deficiency, unspecified: Secondary | ICD-10-CM | POA: Diagnosis not present

## 2014-01-26 DIAGNOSIS — E039 Hypothyroidism, unspecified: Secondary | ICD-10-CM | POA: Diagnosis not present

## 2014-01-26 DIAGNOSIS — E785 Hyperlipidemia, unspecified: Secondary | ICD-10-CM | POA: Diagnosis not present

## 2014-01-26 DIAGNOSIS — I10 Essential (primary) hypertension: Secondary | ICD-10-CM | POA: Diagnosis not present

## 2014-01-26 DIAGNOSIS — IMO0001 Reserved for inherently not codable concepts without codable children: Secondary | ICD-10-CM | POA: Diagnosis not present

## 2014-02-02 DIAGNOSIS — E1165 Type 2 diabetes mellitus with hyperglycemia: Secondary | ICD-10-CM | POA: Diagnosis not present

## 2014-02-02 DIAGNOSIS — E039 Hypothyroidism, unspecified: Secondary | ICD-10-CM | POA: Diagnosis not present

## 2014-02-02 DIAGNOSIS — E1129 Type 2 diabetes mellitus with other diabetic kidney complication: Secondary | ICD-10-CM | POA: Diagnosis not present

## 2014-02-02 DIAGNOSIS — E559 Vitamin D deficiency, unspecified: Secondary | ICD-10-CM | POA: Diagnosis not present

## 2014-02-09 DIAGNOSIS — E785 Hyperlipidemia, unspecified: Secondary | ICD-10-CM | POA: Diagnosis not present

## 2014-02-09 DIAGNOSIS — I10 Essential (primary) hypertension: Secondary | ICD-10-CM | POA: Diagnosis not present

## 2014-02-09 DIAGNOSIS — E1129 Type 2 diabetes mellitus with other diabetic kidney complication: Secondary | ICD-10-CM | POA: Diagnosis not present

## 2014-02-20 DIAGNOSIS — F79 Unspecified intellectual disabilities: Secondary | ICD-10-CM | POA: Diagnosis not present

## 2014-02-20 DIAGNOSIS — I10 Essential (primary) hypertension: Secondary | ICD-10-CM | POA: Diagnosis not present

## 2014-02-20 DIAGNOSIS — E119 Type 2 diabetes mellitus without complications: Secondary | ICD-10-CM | POA: Diagnosis not present

## 2014-02-20 DIAGNOSIS — N19 Unspecified kidney failure: Secondary | ICD-10-CM | POA: Diagnosis not present

## 2014-10-05 ENCOUNTER — Emergency Department (HOSPITAL_COMMUNITY): Payer: Medicare Other

## 2014-10-05 ENCOUNTER — Emergency Department (HOSPITAL_COMMUNITY)
Admission: EM | Admit: 2014-10-05 | Discharge: 2014-10-05 | Disposition: A | Discharge status: Home / Self Care | Payer: Medicare Other | Attending: Emergency Medicine | Admitting: Emergency Medicine

## 2014-10-05 ENCOUNTER — Encounter (HOSPITAL_COMMUNITY): Payer: Self-pay

## 2014-10-05 DIAGNOSIS — Y998 Other external cause status: Secondary | ICD-10-CM | POA: Diagnosis not present

## 2014-10-05 DIAGNOSIS — Z87891 Personal history of nicotine dependence: Secondary | ICD-10-CM | POA: Diagnosis not present

## 2014-10-05 DIAGNOSIS — M79602 Pain in left arm: Secondary | ICD-10-CM | POA: Diagnosis not present

## 2014-10-05 DIAGNOSIS — W010XXA Fall on same level from slipping, tripping and stumbling without subsequent striking against object, initial encounter: Secondary | ICD-10-CM | POA: Diagnosis not present

## 2014-10-05 DIAGNOSIS — S7002XA Contusion of left hip, initial encounter: Secondary | ICD-10-CM | POA: Diagnosis not present

## 2014-10-05 DIAGNOSIS — Z791 Long term (current) use of non-steroidal anti-inflammatories (NSAID): Secondary | ICD-10-CM | POA: Insufficient documentation

## 2014-10-05 DIAGNOSIS — Y929 Unspecified place or not applicable: Secondary | ICD-10-CM | POA: Insufficient documentation

## 2014-10-05 DIAGNOSIS — I10 Essential (primary) hypertension: Secondary | ICD-10-CM | POA: Insufficient documentation

## 2014-10-05 DIAGNOSIS — S4992XA Unspecified injury of left shoulder and upper arm, initial encounter: Secondary | ICD-10-CM | POA: Diagnosis not present

## 2014-10-05 DIAGNOSIS — E119 Type 2 diabetes mellitus without complications: Secondary | ICD-10-CM | POA: Diagnosis not present

## 2014-10-05 DIAGNOSIS — Z87440 Personal history of urinary (tract) infections: Secondary | ICD-10-CM | POA: Insufficient documentation

## 2014-10-05 DIAGNOSIS — Z794 Long term (current) use of insulin: Secondary | ICD-10-CM | POA: Insufficient documentation

## 2014-10-05 DIAGNOSIS — S8992XA Unspecified injury of left lower leg, initial encounter: Secondary | ICD-10-CM | POA: Diagnosis not present

## 2014-10-05 DIAGNOSIS — W19XXXA Unspecified fall, initial encounter: Secondary | ICD-10-CM

## 2014-10-05 DIAGNOSIS — Y9389 Activity, other specified: Secondary | ICD-10-CM | POA: Insufficient documentation

## 2014-10-05 DIAGNOSIS — Z872 Personal history of diseases of the skin and subcutaneous tissue: Secondary | ICD-10-CM | POA: Diagnosis not present

## 2014-10-05 DIAGNOSIS — M79605 Pain in left leg: Secondary | ICD-10-CM | POA: Diagnosis not present

## 2014-10-05 HISTORY — DX: Other specified health status: Z78.9

## 2014-10-05 LAB — CBG MONITORING, ED: Glucose-Capillary: 245 mg/dL — ABNORMAL HIGH (ref 70–99)

## 2014-10-05 MED ORDER — NAPROXEN 500 MG PO TABS: 500.0000 mg | ORAL_TABLET | Freq: Two times a day (BID) | ORAL | Status: DC

## 2014-10-05 NOTE — ED Provider Notes (Addendum)
CSN: [id number]     Arrival date & time 10/05/14  0916 History  This chart was scribed for Nat Christen, MD by Stephania Fragmin, ED Scribe. This patient was seen in room APA08/APA08 and the patient's care was started at 9:40 AM.    Chief Complaint  Patient presents with  . Arm Pain   The history is provided by the patient and a significant other. No language interpreter was used.     HPI Comments: JAMAIL LASTRAPES is a 68 y.o. male who presents to the Emergency Department complaining of left humerus and left hip pain that began yesterday when he tripped and fell. Patent has DM and had high blood sugar at the time, per girlfriend. Girlfriend would like for [place] to check his blood sugar. His PCP is Dr. Legrand Rams. No fever, chills, cough, dysuria, stiff neck.  After initial examination: Girlfriend greets [place] in the hall and requests an XR for his legs, stating that they seem broken and may be the reason he fell. Patient confirms pain on his left femur.  Past Medical History  Diagnosis Date  . Hypertension   . Diabetes mellitus   . Smoking   . Abnormal CT scan, kidney 10/06/2011  . UTI (lower urinary tract infection) 10/06/2011  . Bladder wall thickening 10/06/2011  . Acute pyelonephritis 10/07/2011  . 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 Poor historian poor historian   Past Surgical History  Procedure Laterality Date  . Cystoscopy with urethral dilatation N/A 12/29/2013    Procedure: CYSTOSCOPY WITH URETHRAL DILATATION;  Surgeon: Marissa Nestle, MD;  Location: [facility];  Service: Urology;  Laterality: N/A;  . Transurethral resection of prostate N/A 01/04/2014    Procedure: TRANSURETHRAL RESECTION OF THE PROSTATE (TURP) (procedure #2);  Surgeon: Marissa Nestle, MD;  Location: [facility];  Service: Urology;  Laterality: N/A;  . Circumcision N/A 01/04/2014    Procedure: CIRCUMCISION ADULT (procedure #1);  Surgeon: Marissa Nestle, MD;  Location: [facility];  Service: Urology;  Laterality: N/A;   No family  history on file. History  Substance Use Topics  . Smoking status: Former Smoker -- 1.00 packs/day for 10 years  . Smokeless tobacco: Never Used  . Alcohol Use: Yes     Comment: occ. use     Review of Systems  Musculoskeletal: Positive for myalgias.  Psychiatric/Behavioral: Negative for confusion.  All other systems reviewed and are negative.     Allergies  Review of patient's allergies indicates no known allergies.  Home Medications   Prior to Admission medications   Medication Sig Start Date End Date Taking? Authorizing Provider  insulin aspart protamine- aspart (NOVOLOG MIX 70/30) (70-30) 100 UNIT/ML injection Inject 40 Units into the skin 2 (two) times daily.   Yes Historical Provider, MD  levothyroxine (SYNTHROID, LEVOTHROID) 75 MCG tablet Take 75 mcg by mouth daily.   Yes Historical Provider, MD  linagliptin (TRADJENTA) 5 MG TABS tablet Take 5 mg by mouth daily.   Yes Historical Provider, MD  niacin (NIASPAN) 1000 MG CR tablet Take 1,000 mg by mouth at bedtime.   Yes Historical Provider, MD  simvastatin (ZOCOR) 40 MG tablet Take 40 mg by mouth daily.   Yes Historical Provider, MD  naproxen (NAPROSYN) 500 MG tablet Take 1 tablet (500 mg total) by mouth 2 (two) times daily. 10/05/14   Nat Christen, MD   BP 178/129 mmHg  Pulse 129  Temp(Src) 98.3 F (36.8 C) (Oral)  Resp 20  Ht 5\' 8"  (1.727 m)  SpO2 100% Physical Exam  Constitutional: He is oriented to person, place, and time. He appears well-developed and well-nourished.  HENT:  Head: Normocephalic and atraumatic.  Eyes: Conjunctivae and EOM are normal. Pupils are equal, round, and reactive to light.  Neck: Normal range of motion. Neck supple.  Cardiovascular: Normal rate, regular rhythm and normal heart sounds.   Pulmonary/Chest: Effort normal and breath sounds normal.  Abdominal: Soft. Bowel sounds are normal.  Musculoskeletal: Normal range of motion. He exhibits tenderness.  Tenderness to left humerus. Tenderness  to proximal left femur.  Neurological: He is alert and oriented to person, place, and time.  Skin: Skin is warm and dry.  Psychiatric: He has a normal mood and affect. His behavior is normal.  Nursing note and vitals reviewed.   ED Course  Procedures (including critical care time)  DIAGNOSTIC STUDIES: Oxygen Saturation is 100% on room air, normal by my interpretation.    COORDINATION OF CARE: 9:42 AM - Discussed treatment plan with pt at bedside which includes XR and glucose check and pt agreed to plan.   Labs Review Labs Reviewed  CBG MONITORING, ED - Abnormal; Notable for the following:    Glucose-Capillary 245 (*)    All other components within normal limits    Imaging Review Dg Femur Left  10/05/2014   CLINICAL DATA:  Left femur pain after falling at the [employer] yesterday.  EXAM: LEFT FEMUR - 2 VIEW  COMPARISON:  None.  FINDINGS: There is no fracture or dislocation. There is a small effusion at the left knee. Minimal degenerative changes at the left knee.  IMPRESSION: No acute osseous abnormality.  Small left knee effusion.   Electronically Signed   By: Rozetta Nunnery M.D.   On: 10/05/2014 10:59   Dg Humerus Left  10/05/2014   CLINICAL DATA:  Left humerus pain after falling at the barber shop yesterday.  EXAM: LEFT HUMERUS - 2+ VIEW  COMPARISON:  None.  FINDINGS: There is no evidence of fracture or other focal bone lesions. Soft tissues are unremarkable.  IMPRESSION: Normal exam.   Electronically Signed   By: Rozetta Nunnery M.D.   On: 10/05/2014 10:57     EKG Interpretation None      MDM   Final diagnoses:  Fall  Left upper arm injury, initial encounter  Contusion of left hip, initial encounter   Patient does not appear toxic. Plain films of left femur and left humerus negative. Glucose 245.  Previous progress notes reviewed. Patient's been tachycardic in the past.  I personally performed the services described in this documentation, which was scribed in my  presence. The recorded information has been reviewed and is accurate.          Nat Christen, MD 10/05/14 [place], MD 10/05/14 [phone]

## 2014-10-05 NOTE — ED Notes (Signed)
Pt states he fell yesterday on the ground. Complain of pain in left arm.

## 2014-10-05 NOTE — Discharge Instructions (Signed)
X-rays were normal. Medication for pain.

## 2014-10-05 NOTE — ED Notes (Addendum)
C/o fall with left arm pain. Pain appears to be more in elbow area, but patient is not able to pinpoint pain exactly. CMS intact

## 2014-10-05 NOTE — ED Notes (Signed)
MD aware of vital signs. Patient with no complaints at this time. Respirations even and unlabored. Skin warm/dry. Discharge instructions reviewed with patient at this time. Patient given opportunity to voice concerns/ask questions. Patient discharged at this time and left Emergency Department with steady gait.

## 2015-02-28 DIAGNOSIS — F79 Unspecified intellectual disabilities: Secondary | ICD-10-CM | POA: Diagnosis not present

## 2015-02-28 DIAGNOSIS — E079 Disorder of thyroid, unspecified: Secondary | ICD-10-CM | POA: Diagnosis not present

## 2015-02-28 DIAGNOSIS — E1165 Type 2 diabetes mellitus with hyperglycemia: Secondary | ICD-10-CM | POA: Diagnosis not present

## 2015-02-28 DIAGNOSIS — E119 Type 2 diabetes mellitus without complications: Secondary | ICD-10-CM | POA: Diagnosis not present

## 2015-02-28 DIAGNOSIS — E780 Pure hypercholesterolemia: Secondary | ICD-10-CM | POA: Diagnosis not present

## 2015-02-28 DIAGNOSIS — E669 Obesity, unspecified: Secondary | ICD-10-CM | POA: Diagnosis not present

## 2015-02-28 DIAGNOSIS — E785 Hyperlipidemia, unspecified: Secondary | ICD-10-CM | POA: Diagnosis not present

## 2015-02-28 DIAGNOSIS — E039 Hypothyroidism, unspecified: Secondary | ICD-10-CM | POA: Diagnosis not present

## 2015-02-28 DIAGNOSIS — I10 Essential (primary) hypertension: Secondary | ICD-10-CM | POA: Diagnosis not present

## 2015-02-28 DIAGNOSIS — N189 Chronic kidney disease, unspecified: Secondary | ICD-10-CM | POA: Diagnosis not present

## 2015-03-02 ENCOUNTER — Other Ambulatory Visit (HOSPITAL_COMMUNITY): Payer: Self-pay

## 2015-03-02 ENCOUNTER — Inpatient Hospital Stay (HOSPITAL_COMMUNITY)
Admission: EM | Admit: 2015-03-02 | Discharge: 2015-03-08 | DRG: 683 | Disposition: A | Discharge status: Skilled Nursing Facility | Payer: Medicare Other | Attending: Internal Medicine | Admitting: Internal Medicine

## 2015-03-02 ENCOUNTER — Encounter (HOSPITAL_COMMUNITY): Payer: Self-pay

## 2015-03-02 ENCOUNTER — Emergency Department (HOSPITAL_COMMUNITY): Payer: Medicare Other

## 2015-03-02 DIAGNOSIS — E872 Acidosis: Secondary | ICD-10-CM | POA: Diagnosis present

## 2015-03-02 DIAGNOSIS — E039 Hypothyroidism, unspecified: Secondary | ICD-10-CM | POA: Diagnosis present

## 2015-03-02 DIAGNOSIS — D509 Iron deficiency anemia, unspecified: Secondary | ICD-10-CM | POA: Diagnosis not present

## 2015-03-02 DIAGNOSIS — Z87891 Personal history of nicotine dependence: Secondary | ICD-10-CM

## 2015-03-02 DIAGNOSIS — E1165 Type 2 diabetes mellitus with hyperglycemia: Secondary | ICD-10-CM

## 2015-03-02 DIAGNOSIS — E870 Hyperosmolality and hypernatremia: Secondary | ICD-10-CM | POA: Diagnosis present

## 2015-03-02 DIAGNOSIS — N183 Chronic kidney disease, stage 3 (moderate): Secondary | ICD-10-CM | POA: Diagnosis present

## 2015-03-02 DIAGNOSIS — E1122 Type 2 diabetes mellitus with diabetic chronic kidney disease: Secondary | ICD-10-CM | POA: Diagnosis present

## 2015-03-02 DIAGNOSIS — I129 Hypertensive chronic kidney disease with stage 1 through stage 4 chronic kidney disease, or unspecified chronic kidney disease: Secondary | ICD-10-CM | POA: Diagnosis present

## 2015-03-02 DIAGNOSIS — I509 Heart failure, unspecified: Secondary | ICD-10-CM | POA: Diagnosis not present

## 2015-03-02 DIAGNOSIS — E559 Vitamin D deficiency, unspecified: Secondary | ICD-10-CM | POA: Diagnosis present

## 2015-03-02 DIAGNOSIS — N400 Enlarged prostate without lower urinary tract symptoms: Secondary | ICD-10-CM | POA: Diagnosis not present

## 2015-03-02 DIAGNOSIS — N179 Acute kidney failure, unspecified: Principal | ICD-10-CM | POA: Diagnosis present

## 2015-03-02 DIAGNOSIS — D649 Anemia, unspecified: Secondary | ICD-10-CM | POA: Diagnosis not present

## 2015-03-02 DIAGNOSIS — E785 Hyperlipidemia, unspecified: Secondary | ICD-10-CM | POA: Diagnosis present

## 2015-03-02 DIAGNOSIS — N401 Enlarged prostate with lower urinary tract symptoms: Secondary | ICD-10-CM

## 2015-03-02 DIAGNOSIS — F79 Unspecified intellectual disabilities: Secondary | ICD-10-CM

## 2015-03-02 DIAGNOSIS — F70 Mild intellectual disabilities: Secondary | ICD-10-CM | POA: Diagnosis present

## 2015-03-02 DIAGNOSIS — N184 Chronic kidney disease, stage 4 (severe): Secondary | ICD-10-CM | POA: Diagnosis not present

## 2015-03-02 DIAGNOSIS — R338 Other retention of urine: Secondary | ICD-10-CM | POA: Diagnosis present

## 2015-03-02 DIAGNOSIS — N111 Chronic obstructive pyelonephritis: Secondary | ICD-10-CM | POA: Diagnosis not present

## 2015-03-02 DIAGNOSIS — I10 Essential (primary) hypertension: Secondary | ICD-10-CM | POA: Diagnosis not present

## 2015-03-02 HISTORY — DX: Benign prostatic hyperplasia without lower urinary tract symptoms: N40.0

## 2015-03-02 LAB — CBC WITH DIFFERENTIAL/PLATELET
Basophils Absolute: 0 10*3/uL (ref 0.0–0.1)
Basophils Relative: 0 % (ref 0–1)
Eosinophils Absolute: 0.1 10*3/uL (ref 0.0–0.7)
Eosinophils Relative: 3 % (ref 0–5)
HCT: 21 % — ABNORMAL LOW (ref 39.0–52.0)
Hemoglobin: 6.6 g/dL — CL (ref 13.0–17.0)
Lymphocytes Relative: 28 % (ref 12–46)
Lymphs Abs: 1.5 10*3/uL (ref 0.7–4.0)
MCH: 27.7 pg (ref 26.0–34.0)
MCHC: 31.4 g/dL (ref 30.0–36.0)
MCV: 88.2 fL (ref 78.0–100.0)
Monocytes Absolute: 0.4 10*3/uL (ref 0.1–1.0)
Monocytes Relative: 8 % (ref 3–12)
Neutro Abs: 3.2 10*3/uL (ref 1.7–7.7)
Neutrophils Relative %: 61 % (ref 43–77)
Platelets: 253 10*3/uL (ref 150–400)
RBC: 2.38 MIL/uL — ABNORMAL LOW (ref 4.22–5.81)
RDW: 16.5 % — ABNORMAL HIGH (ref 11.5–15.5)
WBC: 5.3 10*3/uL (ref 4.0–10.5)

## 2015-03-02 LAB — COMPREHENSIVE METABOLIC PANEL
ALT: 18 U/L (ref 17–63)
AST: 27 U/L (ref 15–41)
Albumin: 3.9 g/dL (ref 3.5–5.0)
Alkaline Phosphatase: 56 U/L (ref 38–126)
Anion gap: 8 (ref 5–15)
BUN: 38 mg/dL — ABNORMAL HIGH (ref 6–20)
CO2: 20 mmol/L — ABNORMAL LOW (ref 22–32)
Calcium: 8.8 mg/dL — ABNORMAL LOW (ref 8.9–10.3)
Chloride: 113 mmol/L — ABNORMAL HIGH (ref 101–111)
Creatinine, Ser: 3.69 mg/dL — ABNORMAL HIGH (ref 0.61–1.24)
GFR calc Af Amer: 18 mL/min — ABNORMAL LOW (ref >60)
GFR calc non Af Amer: 16 mL/min — ABNORMAL LOW (ref >60)
Glucose, Bld: 124 mg/dL — ABNORMAL HIGH (ref 70–99)
Potassium: 4.7 mmol/L (ref 3.5–5.1)
Sodium: 141 mmol/L (ref 135–145)
Total Bilirubin: 0.6 mg/dL (ref 0.3–1.2)
Total Protein: 7.6 g/dL (ref 6.5–8.1)

## 2015-03-02 LAB — IRON AND TIBC
Iron: 31 ug/dL — ABNORMAL LOW (ref 45–182)
Saturation Ratios: 8 % — ABNORMAL LOW (ref 17.9–39.5)
TIBC: 400 ug/dL (ref 250–450)
UIBC: 369 ug/dL

## 2015-03-02 LAB — TROPONIN I: Troponin I: <0.03 ng/mL (ref <0.031)

## 2015-03-02 LAB — PROTIME-INR
INR: 1.05 (ref 0.00–1.49)
Prothrombin Time: 13.9 seconds (ref 11.6–15.2)

## 2015-03-02 LAB — FOLATE: Folate: 9.1 ng/mL (ref >5.9)

## 2015-03-02 LAB — FERRITIN: Ferritin: 11 ng/mL — ABNORMAL LOW (ref 24–336)

## 2015-03-02 LAB — TSH: TSH: 3.138 u[IU]/mL (ref 0.350–4.500)

## 2015-03-02 LAB — LACTATE DEHYDROGENASE: LDH: 253 U/L — ABNORMAL HIGH (ref 98–192)

## 2015-03-02 LAB — RETICULOCYTES
RBC.: 2.24 MIL/uL — ABNORMAL LOW (ref 4.22–5.81)
Retic Count, Absolute: 53.8 10*3/uL (ref 19.0–186.0)
Retic Ct Pct: 2.4 % (ref 0.4–3.1)

## 2015-03-02 LAB — GLUCOSE, CAPILLARY
Glucose-Capillary: 124 mg/dL — ABNORMAL HIGH (ref 70–99)
Glucose-Capillary: 137 mg/dL — ABNORMAL HIGH (ref 70–99)

## 2015-03-02 LAB — VITAMIN B12: Vitamin B-12: 318 pg/mL (ref 180–914)

## 2015-03-02 LAB — POC OCCULT BLOOD, ED: Fecal Occult Bld: NEGATIVE

## 2015-03-02 MED ORDER — SODIUM BICARBONATE 650 MG PO TABS
1300.0000 mg | ORAL_TABLET | Freq: Two times a day (BID) | ORAL | Status: DC
Start: 2015-03-02 — End: 2015-03-08
  Administered 2015-03-02 – 2015-03-08 (×12): 1300 mg via ORAL
  Filled 2015-03-02 (×12): qty 2

## 2015-03-02 MED ORDER — ONDANSETRON HCL 4 MG/2ML IJ SOLN: 4.0000 mg | Freq: Four times a day (QID) | INTRAMUSCULAR | Status: DC | PRN

## 2015-03-02 MED ORDER — ACETAMINOPHEN 325 MG PO TABS: 650.0000 mg | ORAL_TABLET | Freq: Four times a day (QID) | ORAL | Status: DC | PRN

## 2015-03-02 MED ORDER — LEVOTHYROXINE SODIUM 75 MCG PO TABS
75.0000 ug | ORAL_TABLET | ORAL | Status: DC
  Administered 2015-03-03 – 2015-03-08 (×6): 75 ug via ORAL
  Filled 2015-03-02 (×6): qty 1

## 2015-03-02 MED ORDER — FERROUS SULFATE 325 (65 FE) MG PO TABS
325.0000 mg | ORAL_TABLET | Freq: Every day | ORAL | Status: DC
  Administered 2015-03-03: 325 mg via ORAL
  Filled 2015-03-02: qty 1

## 2015-03-02 MED ORDER — ISOSORB DINITRATE-HYDRALAZINE 20-37.5 MG PO TABS
1.0000 | ORAL_TABLET | Freq: Three times a day (TID) | ORAL | Status: DC
  Administered 2015-03-02 – 2015-03-08 (×17): 1 via ORAL
  Filled 2015-03-02 (×21): qty 1

## 2015-03-02 MED ORDER — ATORVASTATIN CALCIUM 40 MG PO TABS
80.0000 mg | ORAL_TABLET | Freq: Every day | ORAL | Status: DC
  Administered 2015-03-02 – 2015-03-07 (×6): 80 mg via ORAL
  Filled 2015-03-02 (×6): qty 2

## 2015-03-02 MED ORDER — CARVEDILOL 3.125 MG PO TABS
6.2500 mg | ORAL_TABLET | Freq: Two times a day (BID) | ORAL | Status: DC
  Administered 2015-03-02 – 2015-03-08 (×12): 6.25 mg via ORAL
  Filled 2015-03-02 (×12): qty 2

## 2015-03-02 MED ORDER — ACETAMINOPHEN 650 MG RE SUPP: 650.0000 mg | Freq: Four times a day (QID) | RECTAL | Status: DC | PRN

## 2015-03-02 MED ORDER — OXYCODONE HCL 5 MG PO TABS: 5.0000 mg | ORAL_TABLET | ORAL | Status: DC | PRN

## 2015-03-02 MED ORDER — SODIUM CHLORIDE 0.9 % IV SOLN
Freq: Once | INTRAVENOUS | Status: AC
Start: 2015-03-02 — End: 2015-03-02
  Administered 2015-03-02: 17:00:00 via INTRAVENOUS

## 2015-03-02 MED ORDER — ONDANSETRON HCL 4 MG PO TABS: 4.0000 mg | ORAL_TABLET | Freq: Four times a day (QID) | ORAL | Status: DC | PRN

## 2015-03-02 MED ORDER — HYDRALAZINE HCL 20 MG/ML IJ SOLN
10.0000 mg | INTRAMUSCULAR | Status: DC | PRN
  Administered 2015-03-05 – 2015-03-07 (×3): 10 mg via INTRAVENOUS
  Filled 2015-03-02 (×3): qty 1

## 2015-03-02 MED ORDER — INSULIN ASPART 100 UNIT/ML ~~LOC~~ SOLN
0.0000 [IU] | Freq: Three times a day (TID) | SUBCUTANEOUS | Status: DC
  Administered 2015-03-03 (×2): 1 [IU] via SUBCUTANEOUS
  Administered 2015-03-03: 7 [IU] via SUBCUTANEOUS

## 2015-03-02 MED ORDER — INSULIN ASPART 100 UNIT/ML ~~LOC~~ SOLN: 0.0000 [IU] | Freq: Every day | SUBCUTANEOUS | Status: DC

## 2015-03-02 MED ORDER — FUROSEMIDE 80 MG PO TABS
80.0000 mg | ORAL_TABLET | Freq: Two times a day (BID) | ORAL | Status: DC
  Administered 2015-03-02 – 2015-03-03 (×2): 80 mg via ORAL
  Filled 2015-03-02 (×2): qty 1

## 2015-03-02 NOTE — ED Provider Notes (Signed)
CSN: [id number]     Arrival date & time 03/02/15  1237 History   First MD Initiated Contact with Patient 03/02/15 1241     Chief Complaint  Patient presents with  . abnormal blood work    The history is provided by the patient. No language interpreter was used.   This chart was scribed for Orpah Greek, MD by Thea Alken, ED Scribe. This patient was seen in room APA06/APA06 and the patient's care was started at 2:46 PM.  HPI Comments:  Zachary [name] is a 69 y.o. male who present to the Emergency Department for abnormal labs. Pt was sent by Dr. Legrand Rams for abnormal blood test taken 5/4. Pt's hemoglobin was 6.7.  He denies weakness, dizziness, hematuria, trouble breathing and states he is doing well.   Past Medical History  Diagnosis Date  . Hypertension   . Diabetes mellitus   . Smoking   . Abnormal CT scan, kidney 10/06/2011  . UTI (lower urinary tract infection) 10/06/2011  . Bladder wall thickening 10/06/2011  . Acute pyelonephritis 10/07/2011  . 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 Poor historian poor historian   Past Surgical History  Procedure Laterality Date  . Cystoscopy with urethral dilatation N/A 12/29/2013    Procedure: CYSTOSCOPY WITH URETHRAL DILATATION;  Surgeon: Marissa Nestle, MD;  Location: [facility];  Service: Urology;  Laterality: N/A;  . Transurethral resection of prostate N/A 01/04/2014    Procedure: TRANSURETHRAL RESECTION OF THE PROSTATE (TURP) (procedure #2);  Surgeon: Marissa Nestle, MD;  Location: [facility];  Service: Urology;  Laterality: N/A;  . Circumcision N/A 01/04/2014    Procedure: CIRCUMCISION ADULT (procedure #1);  Surgeon: Marissa Nestle, MD;  Location: [facility];  Service: Urology;  Laterality: N/A;   No family history on file. History  Substance Use Topics  . Smoking status: Former Smoker -- 1.00 packs/day for 10 years  . Smokeless tobacco: Never Used  . Alcohol Use: Yes     Comment: occ. use     Review of Systems  All other systems  reviewed and are negative.     Allergies  Review of patient's allergies indicates no known allergies.  Home Medications   Prior to Admission medications   Medication Sig Start Date End Date Taking? Authorizing Provider  insulin aspart protamine- aspart (NOVOLOG MIX 70/30) (70-30) 100 UNIT/ML injection Inject 40 Units into the skin 2 (two) times daily.    Historical Provider, MD  levothyroxine (SYNTHROID, LEVOTHROID) 75 MCG tablet Take 75 mcg by mouth daily.    Historical Provider, MD  linagliptin (TRADJENTA) 5 MG TABS tablet Take 5 mg by mouth daily.    Historical Provider, MD  naproxen (NAPROSYN) 500 MG tablet Take 1 tablet (500 mg total) by mouth 2 (two) times daily. 10/05/14   Nat Christen, MD  niacin (NIASPAN) 1000 MG CR tablet Take 1,000 mg by mouth at bedtime.    Historical Provider, MD  simvastatin (ZOCOR) 40 MG tablet Take 40 mg by mouth daily.    Historical Provider, MD   BP 166/101 mmHg  Pulse 94  Temp(Src) 97.5 F (36.4 C) (Oral)  Resp 12  Ht 5\' 6"  (1.676 m)  Wt 197 lb (89.359 kg)  BMI 31.81 kg/m2  SpO2 100% Physical Exam  Constitutional: He is oriented to person, place, and time. He appears well-developed and well-nourished. No distress.  HENT:  Head: Normocephalic and atraumatic.  Right Ear: Hearing normal.  Left Ear: Hearing normal.  Nose: Nose normal.  Mouth/Throat:  Oropharynx is clear and moist and mucous membranes are normal.  Eyes: Conjunctivae and EOM are normal. Pupils are equal, round, and reactive to light.  Neck: Normal range of motion. Neck supple.  Cardiovascular: Regular rhythm, S1 normal and S2 normal.  Exam reveals no gallop and no friction rub.   No murmur heard. Pulmonary/Chest: Effort normal and breath sounds normal. No respiratory distress. He exhibits no tenderness.  Abdominal: Soft. Normal appearance and bowel sounds are normal. There is no hepatosplenomegaly. There is no tenderness. There is no rebound, no guarding, no tenderness at McBurney's  point and negative Murphy's sign. No hernia.  Musculoskeletal: Normal range of motion.  Neurological: He is alert and oriented to person, place, and time. He has normal strength. No cranial nerve deficit or sensory deficit. Coordination normal. GCS eye subscore is 4. GCS verbal subscore is 5. GCS motor subscore is 6.  Skin: Skin is warm, dry and intact. No rash noted. No cyanosis.  Psychiatric: He has a normal mood and affect. His speech is normal and behavior is normal. Thought content normal.  Nursing note and vitals reviewed.   ED Course  Procedures (including critical care time) Labs Review Labs Reviewed  CBC WITH DIFFERENTIAL/PLATELET - Abnormal; Notable for the following:    RBC 2.38 (*)    Hemoglobin 6.6 (*)    HCT 21.0 (*)    RDW 16.5 (*)    All other components within normal limits  COMPREHENSIVE METABOLIC PANEL - Abnormal; Notable for the following:    Chloride 113 (*)    CO2 20 (*)    Glucose, Bld 124 (*)    BUN 38 (*)    Creatinine, Ser 3.69 (*)    Calcium 8.8 (*)    GFR calc non Af Amer 16 (*)    GFR calc Af Amer 18 (*)    All other components within normal limits  TROPONIN I  PROTIME-INR  VITAMIN B12  FOLATE  IRON AND TIBC  FERRITIN  RETICULOCYTES  LACTATE DEHYDROGENASE  TSH  PROTEIN / CREATININE RATIO, URINE  SODIUM, URINE, RANDOM  CREATININE, URINE, RANDOM  URINALYSIS, ROUTINE W REFLEX MICROSCOPIC  POC OCCULT BLOOD, ED  TYPE AND SCREEN    Imaging Review No results found.   EKG Interpretation   Date/Time:  Friday Mar 02 2015 13:34:20 EDT Ventricular Rate:  98 PR Interval:  184 QRS Duration: 82 QT Interval:  380 QTC Calculation: 485 R Axis:   12 Text Interpretation:  Sinus rhythm Abnormal R-wave progression, early  transition Borderline prolonged QT interval Baseline wander in lead(s) V1  No significant change since last tracing Confirmed by Haedyn Ancrum  MD,  Carver Murakami [phone]) on 03/02/2015 1:58:06 PM      MDM   Final diagnoses:  AKI  (acute kidney injury)   anemia  Acute kidney injury  Uncontrolled hypertension  Presents to the emergency department for evaluation of low hemoglobin. Patient had routine blood work performed at his doctor's office 2 days ago and was called today, told to come to the ER. He did not know why he was here. He was without any complaints at arrival. He is confirmed to be anemic, hemoglobin of 6.6. He is not hypotensive and is not symptomatically. Cardiac evaluation was performed because of low hemoglobin, and he does not have complaints. It was negative. Hemoccult was weakly positive, not clearly the source of anemia but, possible. It also found to have acute kidney injury compared to most recent creatinine from one year ago here. Creatinine  is 2.69 today. He is also exhibiting elevated blood pressure. He will require hospitalization for further management of anemia, acute kidney injury and uncontrolled hypertension.   I personally performed the services described in this documentation, which was scribed in my presence. The recorded information has been reviewed and is accurate.     Orpah Greek, MD 03/02/15 [phone]

## 2015-03-02 NOTE — H&P (Addendum)
[facility] History and Physical  Zachary [name] [id number] DOB: [birth year] DOA: 03/02/2015  Referring physician:  Fanta/Christopher Betsey Holiday PCP:  [name], MD   Chief Complaint:  Abnormal labs  HPI:  The patient is a 69 y.o. year-old male with history of intellectual disability, diabetes mellitus type 2, CKD stage 3, hypertension, BPH who was sent from his primary care doctor's office because of abnormal labs.  In March of 2015, he was hospitalized with AKI due to obstructive uropathy and he underwent TURP with circumcision and his creatinine improved from around 3 to 1.7mg /dl.  According to Dr. Josephine Cables office, Zachary [name] had not been seen in over a year and was brought in for an appointment by his social worker a few days ago.  He had routine bloodwork.  Hemoglobin a year ago was 10, but had trended down to 6.7 mg/dl.  Additionally, his creatine was 1.7 but was up to 3.94 mg/dl with normal potassium but CO2 of 15.  He had significant microalbuminuria. It is unclear how acute his anemia and kidney injury are.    The patient is a poor historian but states that he has been more tired than usual.  He denies cold intolerance or DOE.  He has also been voiding more frequently for the last several weeks.  He denies any obvious blood loss or dark stools.  He denies weight loss or gain.  He denied swelling, but later admitted to swelling in his ankles for "a good while" and could not be more specific than months to years.  He takes chronic naproxen.  In the ER, VS notable for HR 80-100s, BP 160s-180s/90-110s.  Repeat labs confirmed a normocytic anemia and creatinine of 3.69.  He is being admitted for anemia and kidney disease.    Review of Systems:  General:  Denies fevers, chills, weight loss or gain HEENT:  Denies changes to hearing and vision, sinus congestion, sore throat CV:  Denies chest pain and racing heart PULM:  Denies SOB, cough.   GI:  Denies nausea, vomiting, diarrhea.   GU:   Has increased urinary frequency but denies dysuria ENDO:  + polyuria and polydipsia.   HEME:  Denies hematemesis, blood in stools, melena, abnormal bruising or bleeding.  LYMPH:  Denies lymphadenopathy.   MSK:  Denies arthralgias, myalgias.   DERM:  Denies skin rash or ulcer.   NEURO:  Denies focal numbness, weakness  PSYCH:  Denies anxiety and depression.    Past Medical History  Diagnosis Date  . Hypertension   . Diabetes mellitus   . Smoking   . Abnormal CT scan, kidney 10/06/2011  . UTI (lower urinary tract infection) 10/06/2011  . Bladder wall thickening 10/06/2011  . Acute pyelonephritis 10/07/2011  . 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 Poor historian poor historian   Past Surgical History  Procedure Laterality Date  . Cystoscopy with urethral dilatation N/A 12/29/2013    Procedure: CYSTOSCOPY WITH URETHRAL DILATATION;  Surgeon: Marissa Nestle, MD;  Location: [facility];  Service: Urology;  Laterality: N/A;  . Transurethral resection of prostate N/A 01/04/2014    Procedure: TRANSURETHRAL RESECTION OF THE PROSTATE (TURP) (procedure #2);  Surgeon: Marissa Nestle, MD;  Location: [facility];  Service: Urology;  Laterality: N/A;  . Circumcision N/A 01/04/2014    Procedure: CIRCUMCISION ADULT (procedure #1);  Surgeon: Marissa Nestle, MD;  Location: [facility];  Service: Urology;  Laterality: N/A;   Social History:  reports that he has quit smoking. He has never used smokeless  tobacco. He reports that he drinks alcohol. He reports that he does not use illicit drugs.  States Zachary [name] lives with him and there are people who assist him with his medications which he refers to as his "pills."  No Known Allergies  No family history on file.  He does not know what his dad died from.  His sister is in a nursing home because "her legs don't work" but he does not know why.    Prior to Admission medications   Medication Sig Start Date End Date Taking? Authorizing Provider  insulin aspart  protamine- aspart (NOVOLOG MIX 70/30) (70-30) 100 UNIT/ML injection Inject 40 Units into the skin 2 (two) times daily.    Historical Provider, MD  levothyroxine (SYNTHROID, LEVOTHROID) 75 MCG tablet Take 75 mcg by mouth daily.    Historical Provider, MD  linagliptin (TRADJENTA) 5 MG TABS tablet Take 5 mg by mouth daily.    Historical Provider, MD  naproxen (NAPROSYN) 500 MG tablet Take 1 tablet (500 mg total) by mouth 2 (two) times daily. 10/05/14   Nat Christen, MD  niacin (NIASPAN) 1000 MG CR tablet Take 1,000 mg by mouth at bedtime.    Historical Provider, MD  simvastatin (ZOCOR) 40 MG tablet Take 40 mg by mouth daily.    Historical Provider, MD   Physical Exam: Filed Vitals:   03/02/15 1247 03/02/15 1330 03/02/15 1400 03/02/15 1430  BP: 187/113 166/97 180/109 166/101  Pulse: 109 100 89 94  Temp: 97.5 F (36.4 C)     TempSrc: Oral     Resp: 18 14 11 12   Height: 5\' 6"  (1.676 m)     Weight: 89.359 kg (197 lb)     SpO2: 100% 98% 98% 100%   Body mass index is 31.81 kg/(m^2).   General:  Overweight male, NAD  Eyes:  PERRL, anicteric, non-injected.  ENT:  Nares clear.  OP clear, non-erythematous without plaques or exudates.  MMM.    Neck:  Supple without TM or JVD.  Enlarged parotid and subglottic salivary glands  Lymph:  Shotty cervical, supraclavicular, or submandibular LAD.  Cardiovascular:  RRR, normal S1, S2, 2/6 systolic murmur at LSB.  2+ pulses, warm extremities  Respiratory:  CTA bilaterally without increased WOB.  Abdomen:  NABS.  Soft, mildly distended, NT, full feeling    Skin:  No rashes or focal lesions.  Musculoskeletal:  Normal bulk and tone.  2+ bilateral pitting LE edema.  Psychiatric:  Stuttered speech, uses simple language and could answer noncomplex questions.    Neurologic:  CN 3-12 grossly intact.  5/5 strength.  Sensation intact.  Labs on Admission:  Basic Metabolic Panel:  Recent Labs Lab 03/02/15 1333  NA 141  K 4.7  CL 113*  CO2 20*   GLUCOSE 124*  BUN 38*  CREATININE 3.69*  CALCIUM 8.8*   Liver Function Tests:  Recent Labs Lab 03/02/15 1333  AST 27  ALT 18  ALKPHOS 56  BILITOT 0.6  PROT 7.6  ALBUMIN 3.9   No results for input(s): LIPASE, AMYLASE in the last 168 hours. No results for input(s): AMMONIA in the last 168 hours. CBC:  Recent Labs Lab 03/02/15 1333  WBC 5.3  NEUTROABS 3.2  HGB 6.6*  HCT 21.0*  MCV 88.2  PLT 253   Cardiac Enzymes:  Recent Labs Lab 03/02/15 1333  TROPONINI <0.03    BNP (last 3 results) No results for input(s): BNP in the last 8760 hours.  ProBNP (last 3 results) No  results for input(s): PROBNP in the last 8760 hours.  CBG: No results for input(s): GLUCAP in the last 168 hours.  Radiological Exams on Admission: No results found.  EKG: Independently reviewed.  NSR  Assessment/Plan Active Problems:   * No active hospital problems. *  ---  Acute versus chronic kidney injury.  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  His A1c from labs two days ago was 6.9.  He had microalb:creatinine ratio of 1685.  Creatinine was 1.7mg /dl at discharge last year. Peak creatinine 3.94 mg/dl at PCP's office two days ago.  He is clearly volume overloaded. -  UA -  FENa -  Renal US -  Strict I/O and daily weights -  Renally dose medications -  Minimize nephrotoxins and avoid ACEI/ARB -  Start sodium bicarbonate and lasix -  Nephrology consultation  Normocytic anemia, was iron deficient last year.  He had a weakly positive hemoccult in the ER (first was negative, second was weakly positive).  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   -  Iron studies, B12, folate -  TSH -  SPEP/UPEP/IFE -  Transfuse 2 units PRBC -  Continue iron supplementation -  Repeat hgb in AM -  Outpatient GI  evaluation  Lower extremity edema likely secondary to CKD.  Low suspicion for DVT.  He probably has some diastolic heart failure from uncontrolled HTN.   -  ECHO -  TED hose  T2DM with renal manifestations.  a1c 6.9 on 02/28/15. Unclear if he was taking insulin -  Start low dose SSI for now and titrate as needed  Essential hypertension, elevated blood pressures -  Start carvedilol and hydralazine/imdur  Hyperlipidemia, uncontrolled cholesterol -  Start high dose statin -  D/c niacin since no mortality benefit in setting of statin use  Hypothyroidism -  Check TSH -  Continue synthroid  Diet:  Diabetic/healthy heart Access:  PIV IVF:  off Proph:  SCDs  Code Status: full Family Communication: patient alone Disposition Plan: Admit to med-surg  Time spent: 60 min Grizel Vesely [facility] Pager [phone]  If 7PM-7AM, please contact night-coverage [email or web address] Password [email or web address] 03/02/2015, 2:41 PM

## 2015-03-02 NOTE — ED Notes (Signed)
Pt reports was sent here from Dr. Josephine Cables office for abnormal lab results.  PT says doesn't know why he was sent here.  WIll call office for more information.  PT denies any pain, dizziness, or weakness.  Denies SOB.

## 2015-03-02 NOTE — ED Notes (Signed)
CRITICAL VALUE ALERT  Critical value received:  hgb 6.6  Date of notification:  03/02/2015  Time of notification:  1400  Critical value read back:Yes.    Nurse who received alert:  [facility]  MD notified (1st page):  Dr. Betsey Holiday  Time of first page:  45  MD notified (2nd page):  Time of second page:  Responding MD:  Dr. Betsey Holiday  Time MD responded:  1400

## 2015-03-03 ENCOUNTER — Inpatient Hospital Stay (HOSPITAL_COMMUNITY): Payer: Medicare Other

## 2015-03-03 DIAGNOSIS — I10 Essential (primary) hypertension: Secondary | ICD-10-CM

## 2015-03-03 DIAGNOSIS — N401 Enlarged prostate with lower urinary tract symptoms: Secondary | ICD-10-CM

## 2015-03-03 DIAGNOSIS — N179 Acute kidney failure, unspecified: Secondary | ICD-10-CM

## 2015-03-03 DIAGNOSIS — R338 Other retention of urine: Secondary | ICD-10-CM

## 2015-03-03 LAB — RENAL FUNCTION PANEL
Albumin: 3.3 g/dL — ABNORMAL LOW (ref 3.5–5.0)
Anion gap: 6 (ref 5–15)
BUN: 41 mg/dL — ABNORMAL HIGH (ref 6–20)
CO2: 20 mmol/L — ABNORMAL LOW (ref 22–32)
Calcium: 8.3 mg/dL — ABNORMAL LOW (ref 8.9–10.3)
Chloride: 115 mmol/L — ABNORMAL HIGH (ref 101–111)
Creatinine, Ser: 4.03 mg/dL — ABNORMAL HIGH (ref 0.61–1.24)
GFR calc Af Amer: 16 mL/min — ABNORMAL LOW (ref >60)
GFR calc non Af Amer: 14 mL/min — ABNORMAL LOW (ref >60)
Glucose, Bld: 132 mg/dL — ABNORMAL HIGH (ref 70–99)
Phosphorus: 5.3 mg/dL — ABNORMAL HIGH (ref 2.5–4.6)
Potassium: 4.3 mmol/L (ref 3.5–5.1)
Sodium: 141 mmol/L (ref 135–145)

## 2015-03-03 LAB — CBC
HCT: 23.8 % — ABNORMAL LOW (ref 39.0–52.0)
Hemoglobin: 7.6 g/dL — ABNORMAL LOW (ref 13.0–17.0)
MCH: 27 pg (ref 26.0–34.0)
MCHC: 31.9 g/dL (ref 30.0–36.0)
MCV: 84.7 fL (ref 78.0–100.0)
Platelets: 202 10*3/uL (ref 150–400)
RBC: 2.81 MIL/uL — ABNORMAL LOW (ref 4.22–5.81)
RDW: 17.8 % — ABNORMAL HIGH (ref 11.5–15.5)
WBC: 5.7 10*3/uL (ref 4.0–10.5)

## 2015-03-03 LAB — URINE MICROSCOPIC-ADD ON

## 2015-03-03 LAB — GLUCOSE, CAPILLARY
Glucose-Capillary: 125 mg/dL — ABNORMAL HIGH (ref 70–99)
Glucose-Capillary: 140 mg/dL — ABNORMAL HIGH (ref 70–99)
Glucose-Capillary: 339 mg/dL — ABNORMAL HIGH (ref 70–99)
Glucose-Capillary: 79 mg/dL (ref 70–99)

## 2015-03-03 LAB — SODIUM, URINE, RANDOM: Sodium, Ur: 62 mmol/L

## 2015-03-03 LAB — CREATININE, URINE, RANDOM: Creatinine, Urine: 68.47 mg/dL

## 2015-03-03 LAB — URINALYSIS, ROUTINE W REFLEX MICROSCOPIC
Bilirubin Urine: NEGATIVE
Glucose, UA: NEGATIVE mg/dL
Ketones, ur: NEGATIVE mg/dL
Leukocytes, UA: NEGATIVE
Nitrite: NEGATIVE
Protein, ur: 100 mg/dL — AB
Specific Gravity, Urine: 1.02 (ref 1.005–1.030)
Urobilinogen, UA: 0.2 mg/dL (ref 0.0–1.0)
pH: 5.5 (ref 5.0–8.0)

## 2015-03-03 LAB — TSH: TSH: 3.199 u[IU]/mL (ref 0.350–4.500)

## 2015-03-03 MED ORDER — SODIUM CHLORIDE 0.9 % IV SOLN
INTRAVENOUS | Status: DC
  Administered 2015-03-03 – 2015-03-05 (×6): via INTRAVENOUS
  Administered 2015-03-06: 135 mL/h via INTRAVENOUS

## 2015-03-03 MED ORDER — TAMSULOSIN HCL 0.4 MG PO CAPS
0.4000 mg | ORAL_CAPSULE | Freq: Every day | ORAL | Status: DC
  Administered 2015-03-03 – 2015-03-07 (×5): 0.4 mg via ORAL
  Filled 2015-03-03 (×5): qty 1

## 2015-03-03 MED ORDER — FERROUS SULFATE 325 (65 FE) MG PO TABS
325.0000 mg | ORAL_TABLET | Freq: Three times a day (TID) | ORAL | Status: DC
  Administered 2015-03-03 – 2015-03-04 (×2): 325 mg via ORAL
  Filled 2015-03-03 (×2): qty 1

## 2015-03-03 MED ORDER — PNEUMOCOCCAL VAC POLYVALENT 25 MCG/0.5ML IJ INJ
0.5000 mL | INJECTION | INTRAMUSCULAR | Status: AC
  Administered 2015-03-04: 0.5 mL via INTRAMUSCULAR
  Filled 2015-03-03: qty 0.5

## 2015-03-03 NOTE — Consult Note (Signed)
Reason for Consult: Acute kidney injury superimposed on chronic Referring Physician: [name] is an 69 y.o. male.  HPI: He is a patient with history of mild mental retardation, chronic renal failure stage III, history of pyelonephritis and perinephric abscess, status post TURP presently was sent from office because of anemia and worsening of renal failure. Presently patient offers no complaints. He denies any nausea or vomiting. His appetite is good and no difficulty breathing.  Past Medical History  Diagnosis Date  . Hypertension   . Diabetes mellitus   . Smoking   . Abnormal CT scan, kidney 10/06/2011  . UTI (lower urinary tract infection) 10/06/2011  . Bladder wall thickening 10/06/2011  . Acute pyelonephritis 10/07/2011  . 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 Poor historian poor historian  . BPH (benign prostatic hypertrophy)     Past Surgical History  Procedure Laterality Date  . Cystoscopy with urethral dilatation N/A 12/29/2013    Procedure: CYSTOSCOPY WITH URETHRAL DILATATION;  Surgeon: Marissa Nestle, MD;  Location: [facility];  Service: Urology;  Laterality: N/A;  . Transurethral resection of prostate N/A 01/04/2014    Procedure: TRANSURETHRAL RESECTION OF THE PROSTATE (TURP) (procedure #2);  Surgeon: Marissa Nestle, MD;  Location: [facility];  Service: Urology;  Laterality: N/A;  . Circumcision N/A 01/04/2014    Procedure: CIRCUMCISION ADULT (procedure #1);  Surgeon: Marissa Nestle, MD;  Location: [facility];  Service: Urology;  Laterality: N/A;    Family History  Problem Relation Age of Onset  . Cancer Mother     Social History:  reports that he has never smoked. He has never used smokeless tobacco. He reports that he drinks alcohol. He reports that he does not use illicit drugs.  Allergies: No Known Allergies  Medications: I have reviewed the patient's current medications.  Results for orders placed or performed during the hospital encounter of 03/02/15  (from the past 48 hour(s))  CBC with Differential/Platelet     Status: Abnormal   Collection Time: 03/02/15  1:33 PM  Result Value Ref Range   WBC 5.3 4.0 - 10.5 K/uL   RBC 2.38 (L) 4.22 - 5.81 MIL/uL   Hemoglobin 6.6 (LL) 13.0 - 17.0 g/dL    Comment: RESULT REPEATED AND VERIFIED CRITICAL RESULT CALLED TO, READ BACK BY AND VERIFIED WITH: CARDWELL,L AT 1355 BY HUFFINES,S ON 03/02/15    HCT 21.0 (L) 39.0 - 52.0 %   MCV 88.2 78.0 - 100.0 fL   MCH 27.7 26.0 - 34.0 pg   MCHC 31.4 30.0 - 36.0 g/dL   RDW 16.5 (H) 11.5 - 15.5 %   Platelets 253 150 - 400 K/uL   Neutrophils Relative % 61 43 - 77 %   Neutro Abs 3.2 1.7 - 7.7 K/uL   Lymphocytes Relative 28 12 - 46 %   Lymphs Abs 1.5 0.7 - 4.0 K/uL   Monocytes Relative 8 3 - 12 %   Monocytes Absolute 0.4 0.1 - 1.0 K/uL   Eosinophils Relative 3 0 - 5 %   Eosinophils Absolute 0.1 0.0 - 0.7 K/uL   Basophils Relative 0 0 - 1 %   Basophils Absolute 0.0 0.0 - 0.1 K/uL  Comprehensive metabolic panel     Status: Abnormal   Collection Time: 03/02/15  1:33 PM  Result Value Ref Range   Sodium 141 135 - 145 mmol/L   Potassium 4.7 3.5 - 5.1 mmol/L   Chloride 113 (H) 101 - 111 mmol/L   CO2  20 (L) 22 - 32 mmol/L   Glucose, Bld 124 (H) 70 - 99 mg/dL   BUN 38 (H) 6 - 20 mg/dL   Creatinine, Ser 3.69 (H) 0.61 - 1.24 mg/dL   Calcium 8.8 (L) 8.9 - 10.3 mg/dL   Total Protein 7.6 6.5 - 8.1 g/dL   Albumin 3.9 3.5 - 5.0 g/dL   AST 27 15 - 41 U/L   ALT 18 17 - 63 U/L   Alkaline Phosphatase 56 38 - 126 U/L   Total Bilirubin 0.6 0.3 - 1.2 mg/dL   GFR calc non Af Amer 16 (L) >60 mL/min   GFR calc Af Amer 18 (L) >60 mL/min    Comment: (NOTE) The eGFR has been calculated using the CKD EPI equation. This calculation has not been validated in all clinical situations. eGFR's persistently <60 mL/min signify possible Chronic Kidney Disease.    Anion gap 8 5 - 15  Troponin I     Status: None   Collection Time: 03/02/15  1:33 PM  Result Value Ref Range    Troponin I <0.03 <0.031 ng/mL    Comment:        NO INDICATION OF MYOCARDIAL INJURY.   Protime-INR     Status: None   Collection Time: 03/02/15  1:33 PM  Result Value Ref Range   Prothrombin Time 13.9 11.6 - 15.2 seconds   INR 1.05 0.00 - 1.49  Type and screen     Status: None (Preliminary result)   Collection Time: 03/02/15  1:33 PM  Result Value Ref Range   ABO/RH(D) B POS    Antibody Screen NEG    Sample Expiration 03/05/2015    Unit Number [id number]    Blood Component Type RED CELLS,LR    Unit division 00    Status of Unit ISSUED,FINAL    Transfusion Status OK TO TRANSFUSE    Crossmatch Result Compatible    Unit Number [id number]    Blood Component Type RED CELLS,LR    Unit division 00    Status of Unit ISSUED    Transfusion Status OK TO TRANSFUSE    Crossmatch Result Compatible   POC occult blood, ED     Status: None   Collection Time: 03/02/15  2:03 PM  Result Value Ref Range   Fecal Occult Bld NEGATIVE NEGATIVE  Vitamin B12     Status: None   Collection Time: 03/02/15  2:17 PM  Result Value Ref Range   Vitamin B-12 318 180 - 914 pg/mL    Comment: (NOTE) This assay is not validated for testing neonatal or myeloproliferative syndrome specimens for Vitamin B12 levels. Performed at [facility]   Folate     Status: None   Collection Time: 03/02/15  2:17 PM  Result Value Ref Range   Folate 9.1 >5.9 ng/mL    Comment: Performed at [facility]  Iron and TIBC     Status: Abnormal   Collection Time: 03/02/15  2:17 PM  Result Value Ref Range   Iron 31 (L) 45 - 182 ug/dL   TIBC 400 250 - 450 ug/dL   Saturation Ratios 8 (L) 17.9 - 39.5 %   UIBC 369 ug/dL    Comment: Performed at [facility]  Ferritin     Status: Abnormal   Collection Time: 03/02/15  2:17 PM  Result Value Ref Range   Ferritin 11 (L) 24 - 336 ng/mL    Comment: Performed at [facility]  Status: Abnormal   Collection Time: 03/02/15  2:17 PM   Result Value Ref Range   Retic Ct Pct 2.4 0.4 - 3.1 %   RBC. 2.24 (L) 4.22 - 5.81 MIL/uL   Retic Count, Manual 53.8 19.0 - 186.0 K/uL  Lactate dehydrogenase     Status: Abnormal   Collection Time: 03/02/15  2:17 PM  Result Value Ref Range   LDH 253 (H) 98 - 192 U/L  TSH     Status: None   Collection Time: 03/02/15  2:30 PM  Result Value Ref Range   TSH 3.138 0.350 - 4.500 uIU/mL  Glucose, capillary     Status: Abnormal   Collection Time: 03/02/15  6:46 PM  Result Value Ref Range   Glucose-Capillary 124 (H) 70 - 99 mg/dL   Comment 1 Notify RN    Comment 2 Document in Chart   Glucose, capillary     Status: Abnormal   Collection Time: 03/02/15 10:44 PM  Result Value Ref Range   Glucose-Capillary 137 (H) 70 - 99 mg/dL   Comment 1 Notify RN    Comment 2 Document in Chart   Sodium, urine, random     Status: None   Collection Time: 03/03/15 12:39 AM  Result Value Ref Range   Sodium, Ur 62 mmol/L  Creatinine, urine, random     Status: None   Collection Time: 03/03/15 12:39 AM  Result Value Ref Range   Creatinine, Urine 68.47 mg/dL  Urinalysis, Routine w reflex microscopic     Status: Abnormal   Collection Time: 03/03/15 12:39 AM  Result Value Ref Range   Color, Urine YELLOW YELLOW   APPearance CLEAR CLEAR   Specific Gravity, Urine 1.020 1.005 - 1.030   pH 5.5 5.0 - 8.0   Glucose, UA NEGATIVE NEGATIVE mg/dL   Hgb urine dipstick SMALL (A) NEGATIVE   Bilirubin Urine NEGATIVE NEGATIVE   Ketones, ur NEGATIVE NEGATIVE mg/dL   Protein, ur 100 (A) NEGATIVE mg/dL   Urobilinogen, UA 0.2 0.0 - 1.0 mg/dL   Nitrite NEGATIVE NEGATIVE   Leukocytes, UA NEGATIVE NEGATIVE  Urine microscopic-add on     Status: None   Collection Time: 03/03/15 12:39 AM  Result Value Ref Range   Squamous Epithelial / LPF RARE RARE   WBC, UA 0-2 <3 WBC/hpf   RBC / HPF 0-2 <3 RBC/hpf   Bacteria, UA RARE RARE  Renal function panel     Status: Abnormal   Collection Time: 03/03/15  6:22 AM  Result Value Ref  Range   Sodium 141 135 - 145 mmol/L   Potassium 4.3 3.5 - 5.1 mmol/L   Chloride 115 (H) 101 - 111 mmol/L   CO2 20 (L) 22 - 32 mmol/L   Glucose, Bld 132 (H) 70 - 99 mg/dL   BUN 41 (H) 6 - 20 mg/dL   Creatinine, Ser 4.03 (H) 0.61 - 1.24 mg/dL   Calcium 8.3 (L) 8.9 - 10.3 mg/dL   Phosphorus 5.3 (H) 2.5 - 4.6 mg/dL   Albumin 3.3 (L) 3.5 - 5.0 g/dL   GFR calc non Af Amer 14 (L) >60 mL/min   GFR calc Af Amer 16 (L) >60 mL/min    Comment: (NOTE) The eGFR has been calculated using the CKD EPI equation. This calculation has not been validated in all clinical situations. eGFR's persistently <60 mL/min signify possible Chronic Kidney Disease.    Anion gap 6 5 - 15  CBC     Status: Abnormal   Collection Time:  03/03/15  6:22 AM  Result Value Ref Range   WBC 5.7 4.0 - 10.5 K/uL   RBC 2.81 (L) 4.22 - 5.81 MIL/uL   Hemoglobin 7.6 (L) 13.0 - 17.0 g/dL   HCT 23.8 (L) 39.0 - 52.0 %   MCV 84.7 78.0 - 100.0 fL   MCH 27.0 26.0 - 34.0 pg   MCHC 31.9 30.0 - 36.0 g/dL   RDW 17.8 (H) 11.5 - 15.5 %   Platelets 202 150 - 400 K/uL  Glucose, capillary     Status: Abnormal   Collection Time: 03/03/15  7:38 AM  Result Value Ref Range   Glucose-Capillary 125 (H) 70 - 99 mg/dL   Comment 1 Notify RN    Comment 2 Document in Chart     US Renal  03/02/2015   CLINICAL DATA:  Acute kidney injury.  EXAM: RENAL / URINARY TRACT ULTRASOUND COMPLETE  COMPARISON:  12/30/2013.  FINDINGS: Right Kidney:  Length: 14.6 cm. Diffusely echogenic. No significant change in dilatation of the renal collecting system.  Left Kidney:  Length: 13.6 cm. Diffusely echogenic. No significant change in dilatation of the renal collecting system.  Bladder:  Distended, with a calculated prevoid volume of 1137 cc and postvoid volume of 836 cc. The previously seen bladder mass is no longer visualized.  IMPRESSION: 1. No significant change in moderate-to-marked bilateral hydronephrosis. 2. Diffusely echogenic kidneys compatible with medical renal  disease. 3. Distended urinary bladder with a large postvoid residual. 4. The previously seen bladder mass is no longer visualized.   Electronically Signed   By: Claudie Revering M.D.   On: 03/02/2015 17:00    Review of Systems  Constitutional: Negative for fever and chills.  Respiratory: Negative for shortness of breath.   Cardiovascular: Negative for leg swelling.  Gastrointestinal: Negative for nausea and vomiting.  Genitourinary: Negative for dysuria and urgency.   Blood pressure 138/91, pulse 93, temperature 98.1 F (36.7 C), temperature source Oral, resp. rate 20, height '5\' 6"'  (1.676 m), weight 89.359 kg (197 lb), SpO2 100 %. Physical Exam  Constitutional: No distress.  Eyes: No scleral icterus.  Neck: No JVD present.  Cardiovascular: Regular rhythm.   No murmur heard. Respiratory: No respiratory distress. He has no wheezes. He has no rales.  Musculoskeletal: He exhibits no edema.    Assessment/Plan: Problem #1 acute kidney injury superimposed on chronic. Presently his BUN and creatinine has increased above his baseline of 1.7. Patient had another obstructive uropathy and that possibly has contributed to his renal failure. Patient with history of BPH and he is status post TURP. Problem #2 chronic renal failure: Stage III thought to be secondary to pyelonephritis and obstructive uropathy and recurrent acute kidney injury. Problem #3 diabetes Problem #4 anemia: Possibly iron deficiency anemia and anemia of chronic disease Problem #5 history of mental retardation Problem #6 metabolic acidosis: Patient on sodium bicarbonate Problem #7 history of hypothyroidism Plan: We'll start patient on normal saline at 135 mL per hour We'll check his basic metabolic panel, phosphorus and CBC in the morning We'll check his intact PTH, vitamin D level and iron studies in the morning.  Ernesta Trabert S 03/03/2015, 9:04 AM

## 2015-03-03 NOTE — Progress Notes (Signed)
0730 MD notified of lab result Hgb 7.6 this AM.

## 2015-03-03 NOTE — Progress Notes (Signed)
  Echocardiogram 2D Echocardiogram has been performed.  Lysle Rubens 03/03/2015, 4:19 PM

## 2015-03-03 NOTE — Progress Notes (Signed)
[facility] PROGRESS NOTE  Zachary [name] [id number] DOB: [birth year] DOA: 03/02/2015 PCP: [name], MD  Brief Summary  The patient is a 69 y.o. year-old male with history of intellectual disability, diabetes mellitus type 2, CKD stage 3, hypertension, BPH who was sent from his primary care doctor's office because of abnormal labs. In March of 2015, he was hospitalized with AKI due to obstructive uropathy and he underwent TURP with circumcision and his creatinine improved from around 3 to 1.7mg /dl. According to Zachary [name] office, Zachary [name] had not been seen in over a year and was brought in for an appointment by his social worker a few days ago. He had routine bloodwork. Hemoglobin a year ago was 10, but had trended down to 6.7 mg/dl. Additionally, his creatine was 1.7 but was up to 3.94 mg/dl with normal potassium but CO2 of 15. He had significant microalbuminuria. It is unclear how acute his anemia and kidney injury are.   The patient is a poor historian but states that he has been more tired than usual. He denies cold intolerance or DOE. He has also been voiding more frequently for the last several weeks. He denies any obvious blood loss or dark stools. He denies weight loss or gain. He denied swelling, but later admitted to swelling in his ankles for "a good while" and could not be more specific than months to years. He takes chronic naproxen.  In the ER, VS notable for HR 80-100s, BP 160s-180s/90-110s. Repeat labs confirmed a normocytic anemia and creatinine of 3.69. He is being admitted for anemia and kidney disease.   Assessment/Plan  Acute versus chronic kidney injury. 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 His A1c from labs two days ago was 6.9. He had microalb:creatinine ratio of 1685. Creatinine was 1.7mg /dl at discharge last year. Peak creatinine 3.94 mg/dl  at PCP's office two days ago. -  Appreciate nephrology assistance - FENa 2.39% - Renal US:  Moderate to marked bilateral hydronephrosis, diffusely echogenic kidneys with medical renal disease and distended urinary bladder with large postvoid residual - IV hydration per nephrology -  Will discontinue lasix -  Start flomax   Iron deficiency anemia superimposed on anemia of chronic disease, weakly positive hemoccult in the ER  - ferritin 11, B12 318, folate 9.1 on 03/02/2015 - TSH 3.199 - SPEP/UPEP/IFE pending - Increase iron supplementation - Repeat hgb in AM - Outpatient GI evaluation  Lower extremity edema likely secondary to CKD. Low suspicion for DVT. He probably has some diastolic heart failure from uncontrolled HTN.  - ECHO - TED hose  T2DM with renal manifestations. a1c 6.9 on 02/28/15. Unclear if he was taking the insulin reported on his EMAR - Continue low dose SSI  Essential hypertension, blood pressures improving - started carvedilol and hydralazine/imdur  Hyperlipidemia, uncontrolled cholesterol - Started high dose statin - D/c niacin since no mortality benefit in setting of statin use  Hypothyroidism, stable, TSH 3.199 - Continue synthroid and repeat TSH in a few weeks.  If still a little elevated, consider increasing synthroid some  [place] metabolic acidosis secondary to AKI/CKD -  Continue sodium bicarbonate  Diet: Diabetic/healthy heart Access: PIV IVF: off Proph: SCDs  Code Status: full Family Communication: patient alone Disposition Plan: Admit to med-surg  Consultants:  Nephrology, Zachary [name]   Zachary [name]  Procedures:  RUS  Antibiotics:  none   HPI/Subjective:  Patient states that he feels  well.  Denies acute complaints, SOB, chest pain, swelling.    Objective: Filed Vitals:   03/03/15 0111 03/03/15 0310 03/03/15 0648 03/03/15 1607  BP: 138/89 142/89 138/91 137/82  Pulse: 98 92 93 85  Temp: 98.3 F (36.8 C)  98.2 F (36.8 C) 98.1 F (36.7 C) 98.1 F (36.7 C)  TempSrc: Oral Oral Oral Oral  Resp: 18 18 20 20   Height:      Weight:      SpO2: 100% 100% 100% 100%    Intake/Output Summary (Last 24 hours) at 03/03/15 1614 Last data filed at 03/03/15 1200  Gross per 24 hour  Intake   1910 ml  Output   1025 ml  Net    885 ml   Filed Weights   03/02/15 1247  Weight: 89.359 kg (197 lb)    Exam:   General:  Overweight male, No acute distress  HEENT:  NCAT, MMM  Cardiovascular:  RRR, nl S1, S2, 1/6 systolic murmur at LSB, 2+ pulses, warm extremities  Respiratory:  CTAB, no increased WOB  Abdomen:   NABS, soft, NT/ND  MSK:   Normal tone and bulk, trace RLE edema, no residual LLE edema  Neuro:  Grossly intact  Data Reviewed: Basic Metabolic Panel:  Recent Labs Lab 03/02/15 1333 03/03/15 0622  NA 141 141  K 4.7 4.3  CL 113* 115*  CO2 20* 20*  GLUCOSE 124* 132*  BUN 38* 41*  CREATININE 3.69* 4.03*  CALCIUM 8.8* 8.3*  PHOS  --  5.3*   Liver Function Tests:  Recent Labs Lab 03/02/15 1333 03/03/15 0622  AST 27  --   ALT 18  --   ALKPHOS 56  --   BILITOT 0.6  --   PROT 7.6  --   ALBUMIN 3.9 3.3*   No results for input(s): LIPASE, AMYLASE in the last 168 hours. No results for input(s): AMMONIA in the last 168 hours. CBC:  Recent Labs Lab 03/02/15 1333 03/03/15 0622  WBC 5.3 5.7  NEUTROABS 3.2  --   HGB 6.6* 7.6*  HCT 21.0* 23.8*  MCV 88.2 84.7  PLT 253 202   Cardiac Enzymes:  Recent Labs Lab 03/02/15 1333  TROPONINI <0.03   BNP (last 3 results) No results for input(s): BNP in the last 8760 hours.  ProBNP (last 3 results) No results for input(s): PROBNP in the last 8760 hours.  CBG:  Recent Labs Lab 03/02/15 1846 03/02/15 2244 03/03/15 0738 03/03/15 1141  GLUCAP 124* 137* 125* 140*    No results found for this or any previous visit (from the past 240 hour(s)).   Studies: US Renal  03/02/2015   CLINICAL DATA:  Acute kidney injury.   EXAM: RENAL / URINARY TRACT ULTRASOUND COMPLETE  COMPARISON:  12/30/2013.  FINDINGS: Right Kidney:  Length: 14.6 cm. Diffusely echogenic. No significant change in dilatation of the renal collecting system.  Left Kidney:  Length: 13.6 cm. Diffusely echogenic. No significant change in dilatation of the renal collecting system.  Bladder:  Distended, with a calculated prevoid volume of 1137 cc and postvoid volume of 836 cc. The previously seen bladder mass is no longer visualized.  IMPRESSION: 1. No significant change in moderate-to-marked bilateral hydronephrosis. 2. Diffusely echogenic kidneys compatible with medical renal disease. 3. Distended urinary bladder with a large postvoid residual. 4. The previously seen bladder mass is no longer visualized.   Electronically Signed   By: Claudie Revering M.D.   On: 03/02/2015 17:00    Scheduled Meds: .  atorvastatin  80 mg Oral q1800  . carvedilol  6.25 mg Oral BID WC  . ferrous sulfate  325 mg Oral Q breakfast  . furosemide  80 mg Oral BID  . insulin aspart  0-5 Units Subcutaneous QHS  . insulin aspart  0-9 Units Subcutaneous TID WC  . isosorbide-hydrALAZINE  1 tablet Oral TID  . levothyroxine  75 mcg Oral Q24H  . sodium bicarbonate  1,300 mg Oral BID   Continuous Infusions: . sodium chloride 135 mL/hr at 03/03/15 [id number]    Active Problems:   DM (diabetes mellitus), type 2, uncontrolled   Iron deficiency anemia   Mental retardation   Acute renal failure    Time spent: 30 min    Katalina Magri, [facility] Pager [phone]. If 7PM-7AM, please contact night-coverage at [email or web address], password [facility] 03/03/2015, 4:14 PM  LOS: 1 day

## 2015-03-03 NOTE — Progress Notes (Signed)
24 hour urine started at 0044. Set to end on 03/04/15 @ 0044

## 2015-03-04 DIAGNOSIS — N179 Acute kidney failure, unspecified: Secondary | ICD-10-CM | POA: Diagnosis not present

## 2015-03-04 LAB — PREPARE RBC (CROSSMATCH)

## 2015-03-04 LAB — CBC
HCT: 22.6 % — ABNORMAL LOW (ref 39.0–52.0)
Hemoglobin: 7.3 g/dL — ABNORMAL LOW (ref 13.0–17.0)
MCH: 27.1 pg (ref 26.0–34.0)
MCHC: 32.3 g/dL (ref 30.0–36.0)
MCV: 84 fL (ref 78.0–100.0)
Platelets: 186 10*3/uL (ref 150–400)
RBC: 2.69 MIL/uL — ABNORMAL LOW (ref 4.22–5.81)
RDW: 17.9 % — ABNORMAL HIGH (ref 11.5–15.5)
WBC: 5.6 10*3/uL (ref 4.0–10.5)

## 2015-03-04 LAB — GLUCOSE, CAPILLARY
Glucose-Capillary: 115 mg/dL — ABNORMAL HIGH (ref 70–99)
Glucose-Capillary: 114 mg/dL — ABNORMAL HIGH (ref 70–99)
Glucose-Capillary: 116 mg/dL — ABNORMAL HIGH (ref 70–99)
Glucose-Capillary: 93 mg/dL (ref 70–99)

## 2015-03-04 LAB — RENAL FUNCTION PANEL
Albumin: 3.1 g/dL — ABNORMAL LOW (ref 3.5–5.0)
Anion gap: 5 (ref 5–15)
BUN: 40 mg/dL — ABNORMAL HIGH (ref 6–20)
CO2: 18 mmol/L — ABNORMAL LOW (ref 22–32)
Calcium: 7.9 mg/dL — ABNORMAL LOW (ref 8.9–10.3)
Chloride: 119 mmol/L — ABNORMAL HIGH (ref 101–111)
Creatinine, Ser: 4.23 mg/dL — ABNORMAL HIGH (ref 0.61–1.24)
GFR calc Af Amer: 15 mL/min — ABNORMAL LOW
GFR calc non Af Amer: 13 mL/min — ABNORMAL LOW
Glucose, Bld: 117 mg/dL — ABNORMAL HIGH (ref 70–99)
Phosphorus: 4.9 mg/dL — ABNORMAL HIGH (ref 2.5–4.6)
Potassium: 4.1 mmol/L (ref 3.5–5.1)
Sodium: 142 mmol/L (ref 135–145)

## 2015-03-04 LAB — HIV ANTIBODY (ROUTINE TESTING W REFLEX): HIV Screen 4th Generation wRfx: NONREACTIVE

## 2015-03-04 LAB — PTH, INTACT AND CALCIUM
Calcium, Total (PTH): 8.3 mg/dL — ABNORMAL LOW (ref 8.6–10.2)
PTH: 130 pg/mL — ABNORMAL HIGH (ref 15–65)

## 2015-03-04 MED ORDER — SODIUM CHLORIDE 0.9 % IV SOLN
Freq: Once | INTRAVENOUS | Status: AC
  Administered 2015-03-04: 14:00:00 via INTRAVENOUS

## 2015-03-04 MED ORDER — SODIUM CHLORIDE 0.9 % IV SOLN
510.0000 mg | INTRAVENOUS | Status: DC
  Administered 2015-03-04: 510 mg via INTRAVENOUS
  Filled 2015-03-04: qty 17

## 2015-03-04 NOTE — Progress Notes (Signed)
Subjective: Patient was admitted due to acute renal failure and severe anemia. His Hemoglobin is still around 7.3. His renal function as not yet improved. Objective: Vital signs in last 24 hours: Temp:  [97.7 F (36.5 C)-98.5 F (36.9 C)] 97.7 F (36.5 C) (05/08 0538) Pulse Rate:  [85-96] 96 (05/08 0538) Resp:  [20] 20 (05/08 0538) BP: (137-156)/(74-94) 156/74 mmHg (05/08 0538) SpO2:  [98 %-100 %] 100 % (05/08 0538) Weight change:  Last BM Date: 03/02/15  Intake/Output from previous day: 05/07 0701 - 05/08 0700 In: 3453.8 [P.O.:720; I.V.:2733.8] Out: 1125 [Urine:1125]  PHYSICAL EXAM General appearance: alert and slowed mentation Resp: clear to auscultation bilaterally Cardio: S1, S2 normal GI: soft, non-tender; bowel sounds normal; no masses,  no organomegaly Extremities: extremities normal, atraumatic, no cyanosis or edema  Lab Results:  Results for orders placed or performed during the hospital encounter of 03/02/15 (from the past 48 hour(s))  CBC with Differential/Platelet     Status: Abnormal   Collection Time: 03/02/15  1:33 PM  Result Value Ref Range   WBC 5.3 4.0 - 10.5 K/uL   RBC 2.38 (L) 4.22 - 5.81 MIL/uL   Hemoglobin 6.6 (LL) 13.0 - 17.0 g/dL    Comment: RESULT REPEATED AND VERIFIED CRITICAL RESULT CALLED TO, READ BACK BY AND VERIFIED WITH: CARDWELL,L AT 1355 BY HUFFINES,S ON 03/02/15    HCT 21.0 (L) 39.0 - 52.0 %   MCV 88.2 78.0 - 100.0 fL   MCH 27.7 26.0 - 34.0 pg   MCHC 31.4 30.0 - 36.0 g/dL   RDW 16.5 (H) 11.5 - 15.5 %   Platelets 253 150 - 400 K/uL   Neutrophils Relative % 61 43 - 77 %   Neutro Abs 3.2 1.7 - 7.7 K/uL   Lymphocytes Relative 28 12 - 46 %   Lymphs Abs 1.5 0.7 - 4.0 K/uL   Monocytes Relative 8 3 - 12 %   Monocytes Absolute 0.4 0.1 - 1.0 K/uL   Eosinophils Relative 3 0 - 5 %   Eosinophils Absolute 0.1 0.0 - 0.7 K/uL   Basophils Relative 0 0 - 1 %   Basophils Absolute 0.0 0.0 - 0.1 K/uL  Comprehensive metabolic panel     Status:  Abnormal   Collection Time: 03/02/15  1:33 PM  Result Value Ref Range   Sodium 141 135 - 145 mmol/L   Potassium 4.7 3.5 - 5.1 mmol/L   Chloride 113 (H) 101 - 111 mmol/L   CO2 20 (L) 22 - 32 mmol/L   Glucose, Bld 124 (H) 70 - 99 mg/dL   BUN 38 (H) 6 - 20 mg/dL   Creatinine, Ser 3.69 (H) 0.61 - 1.24 mg/dL   Calcium 8.8 (L) 8.9 - 10.3 mg/dL   Total Protein 7.6 6.5 - 8.1 g/dL   Albumin 3.9 3.5 - 5.0 g/dL   AST 27 15 - 41 U/L   ALT 18 17 - 63 U/L   Alkaline Phosphatase 56 38 - 126 U/L   Total Bilirubin 0.6 0.3 - 1.2 mg/dL   GFR calc non Af Amer 16 (L) >60 mL/min   GFR calc Af Amer 18 (L) >60 mL/min    Comment: (NOTE) The eGFR has been calculated using the CKD EPI equation. This calculation has not been validated in all clinical situations. eGFR's persistently <60 mL/min signify possible Chronic Kidney Disease.    Anion gap 8 5 - 15  Troponin I     Status: None   Collection Time: 03/02/15  1:33  PM  Result Value Ref Range   Troponin I <0.03 <0.031 ng/mL    Comment:        NO INDICATION OF MYOCARDIAL INJURY.   Protime-INR     Status: None   Collection Time: 03/02/15  1:33 PM  Result Value Ref Range   Prothrombin Time 13.9 11.6 - 15.2 seconds   INR 1.05 0.00 - 1.49  Type and screen     Status: None   Collection Time: 03/02/15  1:33 PM  Result Value Ref Range   ABO/RH(D) B POS    Antibody Screen NEG    Sample Expiration 03/05/2015    Unit Number [id number]    Blood Component Type RED CELLS,LR    Unit division 00    Status of Unit ISSUED,FINAL    Transfusion Status OK TO TRANSFUSE    Crossmatch Result Compatible    Unit Number [id number]    Blood Component Type RED CELLS,LR    Unit division 00    Status of Unit ISSUED,FINAL    Transfusion Status OK TO TRANSFUSE    Crossmatch Result Compatible   POC occult blood, ED     Status: None   Collection Time: 03/02/15  2:03 PM  Result Value Ref Range   Fecal Occult Bld NEGATIVE NEGATIVE  Vitamin B12     Status: None    Collection Time: 03/02/15  2:17 PM  Result Value Ref Range   Vitamin B-12 318 180 - 914 pg/mL    Comment: (NOTE) This assay is not validated for testing neonatal or myeloproliferative syndrome specimens for Vitamin B12 levels. Performed at [facility]   Folate     Status: None   Collection Time: 03/02/15  2:17 PM  Result Value Ref Range   Folate 9.1 >5.9 ng/mL    Comment: Performed at [facility]  Iron and TIBC     Status: Abnormal   Collection Time: 03/02/15  2:17 PM  Result Value Ref Range   Iron 31 (L) 45 - 182 ug/dL   TIBC 400 250 - 450 ug/dL   Saturation Ratios 8 (L) 17.9 - 39.5 %   UIBC 369 ug/dL    Comment: Performed at [facility]  Ferritin     Status: Abnormal   Collection Time: 03/02/15  2:17 PM  Result Value Ref Range   Ferritin 11 (L) 24 - 336 ng/mL    Comment: Performed at [facility]  Reticulocytes     Status: Abnormal   Collection Time: 03/02/15  2:17 PM  Result Value Ref Range   Retic Ct Pct 2.4 0.4 - 3.1 %   RBC. 2.24 (L) 4.22 - 5.81 MIL/uL   Retic Count, Manual 53.8 19.0 - 186.0 K/uL  Lactate dehydrogenase     Status: Abnormal   Collection Time: 03/02/15  2:17 PM  Result Value Ref Range   LDH 253 (H) 98 - 192 U/L  TSH     Status: None   Collection Time: 03/02/15  2:30 PM  Result Value Ref Range   TSH 3.138 0.350 - 4.500 uIU/mL  Glucose, capillary     Status: Abnormal   Collection Time: 03/02/15  6:46 PM  Result Value Ref Range   Glucose-Capillary 124 (H) 70 - 99 mg/dL   Comment 1 Notify RN    Comment 2 Document in Chart   Glucose, capillary     Status: Abnormal   Collection Time: 03/02/15 10:44 PM  Result Value Ref Range   Glucose-Capillary 137 (  H) 70 - 99 mg/dL   Comment 1 Notify RN    Comment 2 Document in Chart   Sodium, urine, random     Status: None   Collection Time: 03/03/15 12:39 AM  Result Value Ref Range   Sodium, Ur 62 mmol/L  Creatinine, urine, random     Status: None   Collection Time: 03/03/15  12:39 AM  Result Value Ref Range   Creatinine, Urine 68.47 mg/dL  Urinalysis, Routine w reflex microscopic     Status: Abnormal   Collection Time: 03/03/15 12:39 AM  Result Value Ref Range   Color, Urine YELLOW YELLOW   APPearance CLEAR CLEAR   Specific Gravity, Urine 1.020 1.005 - 1.030   pH 5.5 5.0 - 8.0   Glucose, UA NEGATIVE NEGATIVE mg/dL   Hgb urine dipstick SMALL (A) NEGATIVE   Bilirubin Urine NEGATIVE NEGATIVE   Ketones, ur NEGATIVE NEGATIVE mg/dL   Protein, ur 100 (A) NEGATIVE mg/dL   Urobilinogen, UA 0.2 0.0 - 1.0 mg/dL   Nitrite NEGATIVE NEGATIVE   Leukocytes, UA NEGATIVE NEGATIVE  Urine microscopic-add on     Status: None   Collection Time: 03/03/15 12:39 AM  Result Value Ref Range   Squamous Epithelial / LPF RARE RARE   WBC, UA 0-2 <3 WBC/hpf   RBC / HPF 0-2 <3 RBC/hpf   Bacteria, UA RARE RARE  Renal function panel     Status: Abnormal   Collection Time: 03/03/15  6:22 AM  Result Value Ref Range   Sodium 141 135 - 145 mmol/L   Potassium 4.3 3.5 - 5.1 mmol/L   Chloride 115 (H) 101 - 111 mmol/L   CO2 20 (L) 22 - 32 mmol/L   Glucose, Bld 132 (H) 70 - 99 mg/dL   BUN 41 (H) 6 - 20 mg/dL   Creatinine, Ser 4.03 (H) 0.61 - 1.24 mg/dL   Calcium 8.3 (L) 8.9 - 10.3 mg/dL   Phosphorus 5.3 (H) 2.5 - 4.6 mg/dL   Albumin 3.3 (L) 3.5 - 5.0 g/dL   GFR calc non Af Amer 14 (L) >60 mL/min   GFR calc Af Amer 16 (L) >60 mL/min    Comment: (NOTE) The eGFR has been calculated using the CKD EPI equation. This calculation has not been validated in all clinical situations. eGFR's persistently <60 mL/min signify possible Chronic Kidney Disease.    Anion gap 6 5 - 15  CBC     Status: Abnormal   Collection Time: 03/03/15  6:22 AM  Result Value Ref Range   WBC 5.7 4.0 - 10.5 K/uL   RBC 2.81 (L) 4.22 - 5.81 MIL/uL   Hemoglobin 7.6 (L) 13.0 - 17.0 g/dL   HCT 23.8 (L) 39.0 - 52.0 %   MCV 84.7 78.0 - 100.0 fL   MCH 27.0 26.0 - 34.0 pg   MCHC 31.9 30.0 - 36.0 g/dL   RDW 17.8 (H)  11.5 - 15.5 %   Platelets 202 150 - 400 K/uL  TSH     Status: None   Collection Time: 03/03/15  6:22 AM  Result Value Ref Range   TSH 3.199 0.350 - 4.500 uIU/mL  Glucose, capillary     Status: Abnormal   Collection Time: 03/03/15  7:38 AM  Result Value Ref Range   Glucose-Capillary 125 (H) 70 - 99 mg/dL   Comment 1 Notify RN    Comment 2 Document in Chart   Glucose, capillary     Status: Abnormal   Collection Time: 03/03/15 11:41  AM  Result Value Ref Range   Glucose-Capillary 140 (H) 70 - 99 mg/dL   Comment 1 Notify RN    Comment 2 Document in Chart   Glucose, capillary     Status: Abnormal   Collection Time: 03/03/15  5:08 PM  Result Value Ref Range   Glucose-Capillary 339 (H) 70 - 99 mg/dL   Comment 1 Notify RN    Comment 2 Document in Chart   Glucose, capillary     Status: None   Collection Time: 03/03/15  9:47 PM  Result Value Ref Range   Glucose-Capillary 79 70 - 99 mg/dL   Comment 1 Notify RN    Comment 2 Document in Chart   IFE, Urine (with Tot Prot)     Status: None   Collection Time: 03/04/15 12:44 AM  Result Value Ref Range   Volume, Urin- 2200 mL   Total Protein, Urine PENDING    Total Protein, Urine-Ur/day PENDING    Albumin, U PENDING    Alpha 1, Urine PENDING    Alpha 2, Urine PENDING    Beta, Urine PENDING    Gamma Globulin, Urine PENDING   Renal function panel     Status: Abnormal   Collection Time: 03/04/15  5:50 AM  Result Value Ref Range   Sodium 142 135 - 145 mmol/L   Potassium 4.1 3.5 - 5.1 mmol/L   Chloride 119 (H) 101 - 111 mmol/L   CO2 18 (L) 22 - 32 mmol/L   Glucose, Bld 117 (H) 70 - 99 mg/dL   BUN 40 (H) 6 - 20 mg/dL   Creatinine, Ser 4.23 (H) 0.61 - 1.24 mg/dL   Calcium 7.9 (L) 8.9 - 10.3 mg/dL   Phosphorus 4.9 (H) 2.5 - 4.6 mg/dL   Albumin 3.1 (L) 3.5 - 5.0 g/dL   GFR calc non Af Amer 13 (L) >60 mL/min   GFR calc Af Amer 15 (L) >60 mL/min    Comment: (NOTE) The eGFR has been calculated using the CKD EPI equation. This calculation  has not been validated in all clinical situations. eGFR's persistently <60 mL/min signify possible Chronic Kidney Disease.    Anion gap 5 5 - 15  CBC     Status: Abnormal   Collection Time: 03/04/15  5:50 AM  Result Value Ref Range   WBC 5.6 4.0 - 10.5 K/uL   RBC 2.69 (L) 4.22 - 5.81 MIL/uL   Hemoglobin 7.3 (L) 13.0 - 17.0 g/dL   HCT 22.6 (L) 39.0 - 52.0 %   MCV 84.0 78.0 - 100.0 fL   MCH 27.1 26.0 - 34.0 pg   MCHC 32.3 30.0 - 36.0 g/dL   RDW 17.9 (H) 11.5 - 15.5 %   Platelets 186 150 - 400 K/uL  Glucose, capillary     Status: Abnormal   Collection Time: 03/04/15  7:46 AM  Result Value Ref Range   Glucose-Capillary 115 (H) 70 - 99 mg/dL  Glucose, capillary     Status: Abnormal   Collection Time: 03/04/15 11:31 AM  Result Value Ref Range   Glucose-Capillary 114 (H) 70 - 99 mg/dL   Comment 1 Notify RN    Comment 2 Document in Chart     ABGS No results for input(s): PHART, PO2ART, TCO2, HCO3 in the last 72 hours.  Invalid input(s): PCO2 CULTURES No results found for this or any previous visit (from the past 240 hour(s)). Studies/Results: US Renal  03/02/2015   CLINICAL DATA:  Acute kidney injury.  EXAM:  RENAL / URINARY TRACT ULTRASOUND COMPLETE  COMPARISON:  12/30/2013.  FINDINGS: Right Kidney:  Length: 14.6 cm. Diffusely echogenic. No significant change in dilatation of the renal collecting system.  Left Kidney:  Length: 13.6 cm. Diffusely echogenic. No significant change in dilatation of the renal collecting system.  Bladder:  Distended, with a calculated prevoid volume of 1137 cc and postvoid volume of 836 cc. The previously seen bladder mass is no longer visualized.  IMPRESSION: 1. No significant change in moderate-to-marked bilateral hydronephrosis. 2. Diffusely echogenic kidneys compatible with medical renal disease. 3. Distended urinary bladder with a large postvoid residual. 4. The previously seen bladder mass is no longer visualized.   Electronically Signed   By: Claudie Revering M.D.   On: 03/02/2015 17:00    Medications: I have reviewed the patient's current medications.  Assesment:   Active Problems:   DM (diabetes mellitus), type 2, uncontrolled   Iron deficiency anemia   Mental retardation   Acute renal failure   BPH (benign prostatic hypertrophy) with urinary retention    Plan: Medications reviewed Nephrology consult appreciated Will monitor CBC/BMP Will transfuse 2 units PRBC    LOS: 2 days   Jalaiyah Throgmorton 03/04/2015, 12:05 PM

## 2015-03-04 NOTE — Progress Notes (Signed)
24-hr urine completed and sent to lab @ 0044.

## 2015-03-04 NOTE — Progress Notes (Signed)
Subjective: Interval History: has no complaint of difficulty breathing. His appetite is good. Patient presently denies any nausea or vomiting..  Objective: Vital signs in last 24 hours: Temp:  [97.7 F (36.5 C)-98.5 F (36.9 C)] 97.7 F (36.5 C) (05/08 0538) Pulse Rate:  [85-96] 96 (05/08 0538) Resp:  [20] 20 (05/08 0538) BP: (137-156)/(74-94) 156/74 mmHg (05/08 0538) SpO2:  [98 %-100 %] 100 % (05/08 0538) Weight change:   Intake/Output from previous day: 05/07 0701 - 05/08 0700 In: 3453.8 [P.O.:720; I.V.:2733.8] Out: 1125 [Urine:1125] Intake/Output this shift:    General appearance: alert, cooperative and no distress Resp: clear to auscultation bilaterally Cardio: regular rate and rhythm, S1, S2 normal, no murmur, click, rub or gallop GI: soft, non-tender; bowel sounds normal; no masses,  no organomegaly Extremities: extremities normal, atraumatic, no cyanosis or edema  Lab Results:  Recent Labs  03/03/15 0622 03/04/15 0550  WBC 5.7 5.6  HGB 7.6* 7.3*  HCT 23.8* 22.6*  PLT 202 186   BMET:  Recent Labs  03/03/15 0622 03/04/15 0550  NA 141 142  K 4.3 4.1  CL 115* 119*  CO2 20* 18*  GLUCOSE 132* 117*  BUN 41* 40*  CREATININE 4.03* 4.23*  CALCIUM 8.3* 7.9*   No results for input(s): PTH in the last 72 hours. Iron Studies:  Recent Labs  03/02/15 1417  IRON 31*  TIBC 400  FERRITIN 11*    Studies/Results: US Renal  03/02/2015   CLINICAL DATA:  Acute kidney injury.  EXAM: RENAL / URINARY TRACT ULTRASOUND COMPLETE  COMPARISON:  12/30/2013.  FINDINGS: Right Kidney:  Length: 14.6 cm. Diffusely echogenic. No significant change in dilatation of the renal collecting system.  Left Kidney:  Length: 13.6 cm. Diffusely echogenic. No significant change in dilatation of the renal collecting system.  Bladder:  Distended, with a calculated prevoid volume of 1137 cc and postvoid volume of 836 cc. The previously seen bladder mass is no longer visualized.  IMPRESSION: 1. No  significant change in moderate-to-marked bilateral hydronephrosis. 2. Diffusely echogenic kidneys compatible with medical renal disease. 3. Distended urinary bladder with a large postvoid residual. 4. The previously seen bladder mass is no longer visualized.   Electronically Signed   By: Claudie Revering M.D.   On: 03/02/2015 17:00    I have reviewed the patient's current medications.  Assessment/Plan: Problem #1 acute kidney injury: Possibly combination of prerenal syndrome and obstructive uropathy. Presently his BUN and creatinine is increasing but the rate of increase has slowed down. Patient does not have any uremic signs and symptoms. Problem #2 chronic renal failure: Stage III Problem #3 history of BPH: Status post TURP Problem #4 history of diabetes: Patient denies any polyuria or polydipsia presently his blood sugar seems to be reasonably controlled. Problem #5 metabolic acidosis: Patient is on sodium bicarbonate his CO2 is low and declining. Problem #6 anemia: His hemoglobin is better but remains low. Iron saturation and ferritin is low. Hence iron deficiency anemia. Problem #7 metabolic bone disease calcium is in range. Presently PTH and vitamin D is pending. Problem #8 mental exacerbation. Plan: We'll continue with hydration We'll give patient Feraheme 510 mg IV now and repeat with another dose in a week. We'll check his basic metabolic panel, phosphorus and CBC in the morning.   LOS: 2 days   Bently Morath S 03/04/2015,9:33 AM

## 2015-03-05 LAB — RENAL FUNCTION PANEL
Albumin: 3.6 g/dL (ref 3.5–5.0)
Anion gap: 9 (ref 5–15)
BUN: 36 mg/dL — ABNORMAL HIGH (ref 6–20)
CO2: 18 mmol/L — ABNORMAL LOW (ref 22–32)
Calcium: 8.8 mg/dL — ABNORMAL LOW (ref 8.9–10.3)
Chloride: 118 mmol/L — ABNORMAL HIGH (ref 101–111)
Creatinine, Ser: 4.01 mg/dL — ABNORMAL HIGH (ref 0.61–1.24)
GFR calc Af Amer: 17 mL/min — ABNORMAL LOW (ref >60)
GFR calc non Af Amer: 14 mL/min — ABNORMAL LOW (ref >60)
Glucose, Bld: 93 mg/dL (ref 70–99)
Phosphorus: 4.8 mg/dL — ABNORMAL HIGH (ref 2.5–4.6)
Potassium: 4.4 mmol/L (ref 3.5–5.1)
Sodium: 145 mmol/L (ref 135–145)

## 2015-03-05 LAB — TYPE AND SCREEN
ABO/RH(D): B POS
Antibody Screen: NEGATIVE
Unit division: 0
Unit division: 0
Unit division: 0
Unit division: 0

## 2015-03-05 LAB — GLUCOSE, CAPILLARY
Glucose-Capillary: 90 mg/dL (ref 70–99)
Glucose-Capillary: 89 mg/dL (ref 70–99)
Glucose-Capillary: 86 mg/dL (ref 70–99)
Glucose-Capillary: 105 mg/dL — ABNORMAL HIGH (ref 70–99)

## 2015-03-05 LAB — OCCULT BLOOD, POC DEVICE: Fecal Occult Bld: POSITIVE — AB

## 2015-03-05 LAB — PROTEIN ELECTROPHORESIS, SERUM
A/G Ratio: 1 (ref 0.7–2.0)
Albumin ELP: 3.4 g/dL (ref 3.2–5.6)
Alpha-1-Globulin: 0.3 g/dL (ref 0.1–0.4)
Alpha-2-Globulin: 0.6 g/dL (ref 0.4–1.2)
Beta Globulin: 1.5 g/dL — ABNORMAL HIGH (ref 0.6–1.3)
Gamma Globulin: 1.1 g/dL (ref 0.5–1.6)
Globulin, Total: 3.4 g/dL (ref 2.0–4.5)
Total Protein ELP: 6.8 g/dL (ref 6.0–8.5)

## 2015-03-05 LAB — CBC
HCT: 30.7 % — ABNORMAL LOW (ref 39.0–52.0)
Hemoglobin: 9.7 g/dL — ABNORMAL LOW (ref 13.0–17.0)
MCH: 26.7 pg (ref 26.0–34.0)
MCHC: 31.6 g/dL (ref 30.0–36.0)
MCV: 84.6 fL (ref 78.0–100.0)
Platelets: 193 10*3/uL (ref 150–400)
RBC: 3.63 MIL/uL — ABNORMAL LOW (ref 4.22–5.81)
RDW: 17.3 % — ABNORMAL HIGH (ref 11.5–15.5)
WBC: 6.3 10*3/uL (ref 4.0–10.5)

## 2015-03-05 LAB — PHOSPHORUS: Phosphorus: 4.8 mg/dL — ABNORMAL HIGH (ref 2.5–4.6)

## 2015-03-05 LAB — VITAMIN D 25 HYDROXY (VIT D DEFICIENCY, FRACTURES): Vit D, 25-Hydroxy: 7.5 ng/mL — ABNORMAL LOW (ref 30.0–100.0)

## 2015-03-05 MED ORDER — CALCITRIOL 0.25 MCG PO CAPS
0.2500 ug | ORAL_CAPSULE | Freq: Every day | ORAL | Status: DC
  Administered 2015-03-05 – 2015-03-08 (×4): 0.25 ug via ORAL
  Filled 2015-03-05 (×4): qty 1

## 2015-03-05 NOTE — Progress Notes (Signed)
Subjective: Interval History: Patient denies any nausea or vomiting . His appetite is good and no difficulty in breathing  Objective: Vital signs in last 24 hours: Temp:  [97.7 F (36.5 C)-98.3 F (36.8 C)] 98.1 F (36.7 C) (05/09 0659) Pulse Rate:  [92-108] 108 (05/09 0933) Resp:  [17-20] 20 (05/09 0659) BP: (152-180)/(90-102) 170/99 mmHg (05/09 0933) SpO2:  [100 %] 100 % (05/09 0659) Weight change:   Intake/Output from previous day: 05/08 0701 - 05/09 0700 In: 3776.9 [P.O.:720; I.V.:2282; Blood:657.9; IV Piggyback:117] Out: 400 [Urine:400] Intake/Output this shift:    General appearance: alert, cooperative and no distress Resp: clear to auscultation bilaterally Cardio: regular rate and rhythm, S1, S2 normal, no murmur, click, rub or gallop GI: soft, non-tender; bowel sounds normal; no masses,  no organomegaly Extremities: extremities normal, atraumatic, no cyanosis or edema  Lab Results:  Recent Labs  03/04/15 0550 03/05/15 0550  WBC 5.6 6.3  HGB 7.3* 9.7*  HCT 22.6* 30.7*  PLT 186 193   BMET:   Recent Labs  03/04/15 0550 03/05/15 0553  NA 142 145  K 4.1 4.4  CL 119* 118*  CO2 18* 18*  GLUCOSE 117* 93  BUN 40* 36*  CREATININE 4.23* 4.01*  CALCIUM 7.9* 8.8*    Recent Labs  03/03/15 0622  PTH 130*  Comment   Iron Studies:   Recent Labs  03/02/15 1417  IRON 31*  TIBC 400  FERRITIN 11*    Studies/Results: No results found.  I have reviewed the patient's current medications.  Assessment/Plan: Problem #1 acute kidney injury: His BUN and creatinine seems to be coming down. His renal function overall is improving. Presently patient is non-oliguric. Problem #2 chronic renal failure: Stage III Problem #3 history of BPH: Status post TURP Problem #4 history of diabetes: His blood sugar is reasonably controlled Problem #5 metabolic acidosis: Patient is on sodium bicarbonate his CO2 is low but stable. Problem #6 anemia: His hemoglobin is better  but remains low. Iron saturation and ferritin is low. Hence iron deficiency anemia. Patient is status post IV I had obstruction yesterday. 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 His PTH is high and vitamin D is pending. Problem #8 mental exacerbation. Plan: We'll continue with hydration We'll start patient on Rocaltrol 0.25 g by mouth daily.  Will give patient another dose of IV iron next week. We'll check his basic metabolic panel,and CBC in the morning.   LOS: 3 days   Izear Pine S 03/05/2015,10:00 AM

## 2015-03-05 NOTE — Clinical Social Work Placement (Signed)
   CLINICAL SOCIAL WORK PLACEMENT  NOTE  Date:  03/05/2015  Patient Details  Name: Zachary [name] MRN: [id number] Date of Birth: 03-Oct-1946  Clinical Social Work is seeking post-discharge placement for this patient at the [place] level of care (*CSW will initial, date and re-position this form in  chart as items are completed):  Yes   Patient/family provided with [place] Work Department's list of facilities offering this level of care within the geographic area requested by the patient (or if unable, by the patient's family).  Yes   Patient/family informed of their freedom to choose among providers that offer the needed level of care, that participate in Medicare, Medicaid or managed care program needed by the patient, have an available bed and are willing to accept the patient.  Yes   Patient/family informed of [facility]'s ownership interest in [facility] and [facility], as well as of the fact that they are under no obligation to receive care at these facilities.  PASRR submitted to EDS on 03/05/15     PASRR number received on 03/05/15     Existing PASRR number confirmed on       FL2 transmitted to all facilities in geographic area requested by pt/family on 03/05/15     FL2 transmitted to all facilities within larger geographic area on       Patient informed that his/her managed care company has contracts with or will negotiate with certain facilities, including the following:            Patient/family informed of bed offers received.  Patient chooses bed at       Physician recommends and patient chooses bed at      Patient to be transferred to   on  .  Patient to be transferred to facility by       Patient family notified on   of transfer.  Name of family member notified:        PHYSICIAN       Additional Comment:    _______________________________________________ Salome Arnt, LCSW 03/05/2015, 1:16  PM [phone]

## 2015-03-05 NOTE — Care Management Note (Signed)
Case Management Note  Patient Details  Name: Zachary [name] MRN: [id number] Date of Birth: [birth year]  Subjective/Objective:                  Pt admitted from home with anemia. Pt currently lives with his brother but CPS and APS are involved with pt. Pt is independent with ADL's. Pt had not been to MD within the last year. MD anticipates pt will need placement at discharge.  Action/Plan: CSW is aware that APS is involved with pts case. CPS may petition for guardianship. CSW will also start placement process.  Expected Discharge Date:                  Expected Discharge Plan:  Assisted Living / Rest Home  In-House Referral:  Clinical Social Work  Discharge planning Services  CM Consult  Post Acute Care Choice:    Choice offered to:     DME Arranged:    DME Agency:     HH Arranged:    HH Agency:     Status of Service:  In process, will continue to follow  Medicare Important Message Given:    Date Medicare IM Given:    Medicare IM give by:    Date Additional Medicare IM Given:    Additional Medicare Important Message give by:     If discussed at [place] of Stay Meetings, dates discussed:    Additional Comments:  Joylene Draft, RN 03/05/2015, 11:33 AM

## 2015-03-05 NOTE — Clinical Social Work Note (Signed)
Clinical Social Work Assessment  Patient Details  Name: Zachary [name] MRN: [id number] Date of Birth: Jan 27, 1946  Date of referral:  03/05/15               Reason for consult:  Facility Placement, Guardianship Needs                Permission sought to share information with:    Permission granted to share information::     Name::        Agency::     Relationship::     Contact Information:     Housing/Transportation Living arrangements for the past 2 months:  Single Family Home Source of Information:  Patient Patient Interpreter Needed:  None Criminal Activity/Legal Involvement Pertinent to Current Situation/Hospitalization:  No - Comment as needed Significant Relationships:  Siblings, Significant Other Lives with:  Significant Other Do you feel safe going back to the place where you live?  Yes Need for family participation in patient care:  Yes (Comment)  Care giving concerns:  Pt does not have significant enough support to encourage pt to take medication/go to doctor's visits.    Social Worker assessment / plan:  Carlis Stable, [place] caseworker approached CSW regarding pt. She states that she has had an open case on pt for the past 2 weeks. She took pt to MD last week and pt came to hospital due to abnormal labs. Pt had refused to go to the doctor and stopped taking his medication for the past year. Threasa Beards is concerned about pt's home environment and his ability to understand consequences of not going to the doctor or taking medications. She feels that it is appropriate for them to pursue guardianship. Per MD note, pt has diagnosis of mental retardation. He does have a brother who is somewhat involved. Melanie plans to complete paperwork today and should have decision tomorrow. She requested CSW to begin ALF bed search as she feels this is necessary for pt. CSW met with pt at bedside. He was alert and oriented to self and place. Pt states, "I don't keep up with it"  when asked about month, year. He also was not sure of his address. Pt states he does go to the doctor regularly. He feels he manages fine at home. Pt reports someone lives with him and is "supposed to be girlfriend." CSW discussed d/c plan with pt. He does not want to consider ALF, but indicated if he had to he would consider.   Employment status:  Disabled (Comment on whether or not currently receiving Disability) Insurance information:  Medicare PT Recommendations:  Not assessed at this time Information / Referral to community resources:  Other (Comment Required), [facility] (Comment Required: [place], Name & Number of worker spoken with) (ALF list. [place] [phone]. Open case. )  Patient/Family's Response to care:  Pt does not want to go to ALF, but said if he was told he had to go, he probably would.   Patient/Family's Understanding of and Emotional Response to Diagnosis, Current Treatment, and Prognosis:  Pt is limited due to intellectual disability. He does not appear to have good understanding of current medical issues and appropriate treatment.  Pt very reluctant to consider ALF. Brief support provided.   Emotional Assessment Appearance:  Appears stated age Attitude/Demeanor/Rapport:  Other (Cooperative) Affect (typically observed):  Pleasant Orientation:  Oriented to Self, Oriented to Place Alcohol / Substance use:  Not Applicable Psych involvement (Current and /or in the community):  No (Comment)  Discharge Needs  Concerns to be addressed:  Discharge Planning Concerns Readmission within the last 30 days:  No Current discharge risk:  Cognitively Impaired Barriers to Discharge:  Continued Medical Work up   [employer], [employer], [place] 03/05/2015, 1:20 PM [phone]

## 2015-03-05 NOTE — Progress Notes (Signed)
Subjective: Patient is resting. He feels better. He was transfused 2 units of PRBC. His Hemoglobin has improved.. Objective: Vital signs in last 24 hours: Temp:  [97.7 F (36.5 C)-98.3 F (36.8 C)] 98.1 F (36.7 C) (05/09 0659) Pulse Rate:  [92-99] 99 (05/09 0659) Resp:  [17-20] 20 (05/09 0659) BP: (152-180)/(90-102) 180/102 mmHg (05/09 0659) SpO2:  [100 %] 100 % (05/09 0659) Weight change:  Last BM Date: 03/02/15  Intake/Output from previous day: 05/08 0701 - 05/09 0700 In: 3776.9 [P.O.:720; I.V.:2282; Blood:657.9; IV Piggyback:117] Out: 400 [Urine:400]  PHYSICAL EXAM General appearance: alert and slowed mentation Resp: clear to auscultation bilaterally Cardio: S1, S2 normal GI: soft, non-tender; bowel sounds normal; no masses,  no organomegaly Extremities: extremities normal, atraumatic, no cyanosis or edema  Lab Results:  Results for orders placed or performed during the hospital encounter of 03/02/15 (from the past 48 hour(s))  Glucose, capillary     Status: Abnormal   Collection Time: 03/03/15 11:41 AM  Result Value Ref Range   Glucose-Capillary 140 (H) 70 - 99 mg/dL   Comment 1 Notify RN    Comment 2 Document in Chart   Glucose, capillary     Status: Abnormal   Collection Time: 03/03/15  5:08 PM  Result Value Ref Range   Glucose-Capillary 339 (H) 70 - 99 mg/dL   Comment 1 Notify RN    Comment 2 Document in Chart   Glucose, capillary     Status: None   Collection Time: 03/03/15  9:47 PM  Result Value Ref Range   Glucose-Capillary 79 70 - 99 mg/dL   Comment 1 Notify RN    Comment 2 Document in Chart   IFE, Urine (with Tot Prot)     Status: None   Collection Time: 03/04/15 12:44 AM  Result Value Ref Range   Volume, Urin- 2200 mL   Total Protein, Urine PENDING    Total Protein, Urine-Ur/day PENDING    Albumin, U PENDING    Alpha 1, Urine PENDING    Alpha 2, Urine PENDING    Beta, Urine PENDING    Gamma Globulin, Urine PENDING   Renal function panel      Status: Abnormal   Collection Time: 03/04/15  5:50 AM  Result Value Ref Range   Sodium 142 135 - 145 mmol/L   Potassium 4.1 3.5 - 5.1 mmol/L   Chloride 119 (H) 101 - 111 mmol/L   CO2 18 (L) 22 - 32 mmol/L   Glucose, Bld 117 (H) 70 - 99 mg/dL   BUN 40 (H) 6 - 20 mg/dL   Creatinine, Ser 4.23 (H) 0.61 - 1.24 mg/dL   Calcium 7.9 (L) 8.9 - 10.3 mg/dL   Phosphorus 4.9 (H) 2.5 - 4.6 mg/dL   Albumin 3.1 (L) 3.5 - 5.0 g/dL   GFR calc non Af Amer 13 (L) >60 mL/min   GFR calc Af Amer 15 (L) >60 mL/min    Comment: (NOTE) The eGFR has been calculated using the CKD EPI equation. This calculation has not been validated in all clinical situations. eGFR's persistently <60 mL/min signify possible Chronic Kidney Disease.    Anion gap 5 5 - 15  CBC     Status: Abnormal   Collection Time: 03/04/15  5:50 AM  Result Value Ref Range   WBC 5.6 4.0 - 10.5 K/uL   RBC 2.69 (L) 4.22 - 5.81 MIL/uL   Hemoglobin 7.3 (L) 13.0 - 17.0 g/dL   HCT 22.6 (L) 39.0 - 52.0 %  MCV 84.0 78.0 - 100.0 fL   MCH 27.1 26.0 - 34.0 pg   MCHC 32.3 30.0 - 36.0 g/dL   RDW 17.9 (H) 11.5 - 15.5 %   Platelets 186 150 - 400 K/uL  Glucose, capillary     Status: Abnormal   Collection Time: 03/04/15  7:46 AM  Result Value Ref Range   Glucose-Capillary 115 (H) 70 - 99 mg/dL  Glucose, capillary     Status: Abnormal   Collection Time: 03/04/15 11:31 AM  Result Value Ref Range   Glucose-Capillary 114 (H) 70 - 99 mg/dL   Comment 1 Notify RN    Comment 2 Document in Chart   Prepare RBC     Status: None   Collection Time: 03/04/15 12:00 PM  Result Value Ref Range   Order Confirmation ORDER PROCESSED BY BLOOD BANK   Glucose, capillary     Status: Abnormal   Collection Time: 03/04/15  4:45 PM  Result Value Ref Range   Glucose-Capillary 116 (H) 70 - 99 mg/dL   Comment 1 Notify RN    Comment 2 Document in Chart   Glucose, capillary     Status: None   Collection Time: 03/04/15 10:26 PM  Result Value Ref Range   Glucose-Capillary  93 70 - 99 mg/dL   Comment 1 Notify RN    Comment 2 Document in Chart   Phosphorus     Status: Abnormal   Collection Time: 03/05/15  5:50 AM  Result Value Ref Range   Phosphorus 4.8 (H) 2.5 - 4.6 mg/dL  CBC     Status: Abnormal   Collection Time: 03/05/15  5:50 AM  Result Value Ref Range   WBC 6.3 4.0 - 10.5 K/uL   RBC 3.63 (L) 4.22 - 5.81 MIL/uL   Hemoglobin 9.7 (L) 13.0 - 17.0 g/dL    Comment: DELTA CHECK NOTED   HCT 30.7 (L) 39.0 - 52.0 %   MCV 84.6 78.0 - 100.0 fL   MCH 26.7 26.0 - 34.0 pg   MCHC 31.6 30.0 - 36.0 g/dL   RDW 17.3 (H) 11.5 - 15.5 %   Platelets 193 150 - 400 K/uL  Renal function panel     Status: Abnormal   Collection Time: 03/05/15  5:53 AM  Result Value Ref Range   Sodium 145 135 - 145 mmol/L   Potassium 4.4 3.5 - 5.1 mmol/L   Chloride 118 (H) 101 - 111 mmol/L   CO2 18 (L) 22 - 32 mmol/L   Glucose, Bld 93 70 - 99 mg/dL   BUN 36 (H) 6 - 20 mg/dL   Creatinine, Ser 4.01 (H) 0.61 - 1.24 mg/dL   Calcium 8.8 (L) 8.9 - 10.3 mg/dL   Phosphorus 4.8 (H) 2.5 - 4.6 mg/dL   Albumin 3.6 3.5 - 5.0 g/dL   GFR calc non Af Amer 14 (L) >60 mL/min   GFR calc Af Amer 17 (L) >60 mL/min    Comment: (NOTE) The eGFR has been calculated using the CKD EPI equation. This calculation has not been validated in all clinical situations. eGFR's persistently <60 mL/min signify possible Chronic Kidney Disease.    Anion gap 9 5 - 15    ABGS No results for input(s): PHART, PO2ART, TCO2, HCO3 in the last 72 hours.  Invalid input(s): PCO2 CULTURES No results found for this or any previous visit (from the past 240 hour(s)). Studies/Results: No results found.  Medications: I have reviewed the patient's current medications.  Assesment:   Active  Problems:   DM (diabetes mellitus), type 2, uncontrolled   Iron deficiency anemia   Mental retardation   Acute renal failure   BPH (benign prostatic hypertrophy) with urinary retention    Plan: Medications reviewed Will continue to  monitor CBC/BMP Will continue IV hydration as nephrology recommendation Discussed with case manager for possible assisted living placement since patient has mental retardation and no adequate family support.    LOS: 3 days   Denell Cothern 03/05/2015, 7:49 AM

## 2015-03-05 NOTE — Progress Notes (Signed)
UR chart review completed.  

## 2015-03-06 LAB — GLUCOSE, CAPILLARY
Glucose-Capillary: 74 mg/dL (ref 70–99)
Glucose-Capillary: 103 mg/dL — ABNORMAL HIGH (ref 70–99)
Glucose-Capillary: 86 mg/dL (ref 70–99)
Glucose-Capillary: 94 mg/dL (ref 70–99)

## 2015-03-06 LAB — RENAL FUNCTION PANEL
Albumin: 3.1 g/dL — ABNORMAL LOW (ref 3.5–5.0)
Anion gap: 7 (ref 5–15)
BUN: 32 mg/dL — ABNORMAL HIGH (ref 6–20)
CO2: 18 mmol/L — ABNORMAL LOW (ref 22–32)
Calcium: 8.6 mg/dL — ABNORMAL LOW (ref 8.9–10.3)
Chloride: 123 mmol/L — ABNORMAL HIGH (ref 101–111)
Creatinine, Ser: 3.67 mg/dL — ABNORMAL HIGH (ref 0.61–1.24)
GFR calc Af Amer: 18 mL/min — ABNORMAL LOW (ref >60)
GFR calc non Af Amer: 16 mL/min — ABNORMAL LOW (ref >60)
Glucose, Bld: 75 mg/dL (ref 70–99)
Phosphorus: 4.9 mg/dL — ABNORMAL HIGH (ref 2.5–4.6)
Potassium: 4.5 mmol/L (ref 3.5–5.1)
Sodium: 148 mmol/L — ABNORMAL HIGH (ref 135–145)

## 2015-03-06 LAB — CBC
HCT: 27.9 % — ABNORMAL LOW (ref 39.0–52.0)
Hemoglobin: 8.7 g/dL — ABNORMAL LOW (ref 13.0–17.0)
MCH: 26.9 pg (ref 26.0–34.0)
MCHC: 31.2 g/dL (ref 30.0–36.0)
MCV: 86.4 fL (ref 78.0–100.0)
Platelets: 164 10*3/uL (ref 150–400)
RBC: 3.23 MIL/uL — ABNORMAL LOW (ref 4.22–5.81)
RDW: 17.5 % — ABNORMAL HIGH (ref 11.5–15.5)
WBC: 5.3 10*3/uL (ref 4.0–10.5)

## 2015-03-06 LAB — UIFE/LIGHT CHAINS/TP QN, 24-HR UR
Albumin, U: 68.3 %
Alpha 1, Urine: 4.6 %
Alpha 2, Urine: 3.9 %
Beta, Urine: 10.9 %
Free Kappa/Lambda Ratio: 7.94 (ref 2.04–10.37)
Free Lambda Lt Chains,Ur: 17.5 mg/L — ABNORMAL HIGH (ref 0.24–6.66)
Free Lt Chn Excr Rate: 139 mg/L — ABNORMAL HIGH (ref 1.35–24.19)
Gamma Globulin, Urine: 12.2 %
Total Protein, Urine-Ur/day: 4177.8 mg/24 hr — ABNORMAL HIGH (ref 30.0–150.0)
Total Protein, Urine: 189.9 mg/dL — ABNORMAL HIGH (ref 0.0–15.0)
Volume, Urin-: 2200 mL

## 2015-03-06 MED ORDER — VITAMIN D (ERGOCALCIFEROL) 1.25 MG (50000 UNIT) PO CAPS
50000.0000 [IU] | ORAL_CAPSULE | ORAL | Status: DC
  Administered 2015-03-06: 50000 [IU] via ORAL
  Filled 2015-03-06: qty 1

## 2015-03-06 MED ORDER — SODIUM CHLORIDE 0.45 % IV SOLN
INTRAVENOUS | Status: DC
  Administered 2015-03-06 – 2015-03-08 (×3): via INTRAVENOUS

## 2015-03-06 NOTE — Clinical Social Work Note (Signed)
DSS now has interim guardianship. Paperwork on chart. [facility] can accept pt, but not until Thursday. Melanie aware and this is where they want pt placed. She states they will discuss with supervisor to see what plan will be if pt is ready tomorrow. CSW also discussed with pt. He became somewhat upset. CSW requested that Threasa Beards talk with pt further.   Benay Pike, [place]

## 2015-03-06 NOTE — Clinical Documentation Improvement (Signed)
MD's, NP's, and PA's  Noted documentation of "mental retardation" please provide type of intellectual disability/mental retardation if appropriate for this admission.  Thank you      Please clarify type of intellectual disability/mental retardation:  ? Borderline intellectual functioning (IQ 70-84) ? Mild (IQ level 50-55 to approximately 70) ? Moderate (IQ level 35-40 to 50-55) ? Severe (IQ 20-25 to 35-40) ? Profound (IQ level below 20-25) ? With autistic features/atypical autism (please specify type of associated autism if applicable) ? Learning disability or disorder ? Other (please specify) ? Unknown ? Unable to determine       Thank you,   Ree Kida ,RN Clinical Documentation Specialist:  [facility]

## 2015-03-06 NOTE — Progress Notes (Signed)
Subjective: Patient feels better. His breathing is improved. Social worker is trying to place patient in assisted living. Objective: Vital signs in last 24 hours: Temp:  [98.5 F (36.9 C)-98.6 F (37 C)] 98.6 F (37 C) (05/10 0541) Pulse Rate:  [101-112] 112 (05/10 0541) Resp:  [20] 20 (05/10 0541) BP: (152-181)/(82-100) 156/83 mmHg (05/10 0541) SpO2:  [98 %-100 %] 98 % (05/10 0541) Weight change:  Last BM Date: 03/04/15  Intake/Output from previous day: 05/09 0701 - 05/10 0700 In: 1620 [I.V.:1620] Out: 1150 [Urine:1150]  PHYSICAL EXAM General appearance: alert and slowed mentation Resp: clear to auscultation bilaterally Cardio: S1, S2 normal GI: soft, non-tender; bowel sounds normal; no masses,  no organomegaly Extremities: extremities normal, atraumatic, no cyanosis or edema  Lab Results:  Results for orders placed or performed during the hospital encounter of 03/02/15 (from the past 48 hour(s))  Glucose, capillary     Status: Abnormal   Collection Time: 03/04/15 11:31 AM  Result Value Ref Range   Glucose-Capillary 114 (H) 70 - 99 mg/dL   Comment 1 Notify RN    Comment 2 Document in Chart   Prepare RBC     Status: None   Collection Time: 03/04/15 12:00 PM  Result Value Ref Range   Order Confirmation ORDER PROCESSED BY BLOOD BANK   Glucose, capillary     Status: Abnormal   Collection Time: 03/04/15  4:45 PM  Result Value Ref Range   Glucose-Capillary 116 (H) 70 - 99 mg/dL   Comment 1 Notify RN    Comment 2 Document in Chart   Glucose, capillary     Status: None   Collection Time: 03/04/15 10:26 PM  Result Value Ref Range   Glucose-Capillary 93 70 - 99 mg/dL   Comment 1 Notify RN    Comment 2 Document in Chart   Phosphorus     Status: Abnormal   Collection Time: 03/05/15  5:50 AM  Result Value Ref Range   Phosphorus 4.8 (H) 2.5 - 4.6 mg/dL  CBC     Status: Abnormal   Collection Time: 03/05/15  5:50 AM  Result Value Ref Range   WBC 6.3 4.0 - 10.5 K/uL   RBC  3.63 (L) 4.22 - 5.81 MIL/uL   Hemoglobin 9.7 (L) 13.0 - 17.0 g/dL    Comment: DELTA CHECK NOTED   HCT 30.7 (L) 39.0 - 52.0 %   MCV 84.6 78.0 - 100.0 fL   MCH 26.7 26.0 - 34.0 pg   MCHC 31.6 30.0 - 36.0 g/dL   RDW 17.3 (H) 11.5 - 15.5 %   Platelets 193 150 - 400 K/uL  Renal function panel     Status: Abnormal   Collection Time: 03/05/15  5:53 AM  Result Value Ref Range   Sodium 145 135 - 145 mmol/L   Potassium 4.4 3.5 - 5.1 mmol/L   Chloride 118 (H) 101 - 111 mmol/L   CO2 18 (L) 22 - 32 mmol/L   Glucose, Bld 93 70 - 99 mg/dL   BUN 36 (H) 6 - 20 mg/dL   Creatinine, Ser 4.01 (H) 0.61 - 1.24 mg/dL   Calcium 8.8 (L) 8.9 - 10.3 mg/dL   Phosphorus 4.8 (H) 2.5 - 4.6 mg/dL   Albumin 3.6 3.5 - 5.0 g/dL   GFR calc non Af Amer 14 (L) >60 mL/min   GFR calc Af Amer 17 (L) >60 mL/min    Comment: (NOTE) The eGFR has been calculated using the CKD EPI equation. This calculation has  not been validated in all clinical situations. eGFR's persistently <60 mL/min signify possible Chronic Kidney Disease.    Anion gap 9 5 - 15  Glucose, capillary     Status: None   Collection Time: 03/05/15  8:18 AM  Result Value Ref Range   Glucose-Capillary 90 70 - 99 mg/dL  Glucose, capillary     Status: None   Collection Time: 03/05/15 12:24 PM  Result Value Ref Range   Glucose-Capillary 89 70 - 99 mg/dL   Comment 1 Notify RN    Comment 2 Document in Chart   Glucose, capillary     Status: None   Collection Time: 03/05/15  4:49 PM  Result Value Ref Range   Glucose-Capillary 86 70 - 99 mg/dL   Comment 1 Notify RN   Glucose, capillary     Status: Abnormal   Collection Time: 03/05/15  8:34 PM  Result Value Ref Range   Glucose-Capillary 105 (H) 70 - 99 mg/dL   Comment 1 Notify RN    Comment 2 Document in Chart   Renal function panel     Status: Abnormal   Collection Time: 03/06/15  6:41 AM  Result Value Ref Range   Sodium 148 (H) 135 - 145 mmol/L   Potassium 4.5 3.5 - 5.1 mmol/L   Chloride 123 (H) 101  - 111 mmol/L   CO2 18 (L) 22 - 32 mmol/L   Glucose, Bld 75 70 - 99 mg/dL   BUN 32 (H) 6 - 20 mg/dL   Creatinine, Ser 3.67 (H) 0.61 - 1.24 mg/dL   Calcium 8.6 (L) 8.9 - 10.3 mg/dL   Phosphorus 4.9 (H) 2.5 - 4.6 mg/dL   Albumin 3.1 (L) 3.5 - 5.0 g/dL   GFR calc non Af Amer 16 (L) >60 mL/min   GFR calc Af Amer 18 (L) >60 mL/min    Comment: (NOTE) The eGFR has been calculated using the CKD EPI equation. This calculation has not been validated in all clinical situations. eGFR's persistently <60 mL/min signify possible Chronic Kidney Disease.    Anion gap 7 5 - 15  CBC     Status: Abnormal   Collection Time: 03/06/15  6:41 AM  Result Value Ref Range   WBC 5.3 4.0 - 10.5 K/uL   RBC 3.23 (L) 4.22 - 5.81 MIL/uL   Hemoglobin 8.7 (L) 13.0 - 17.0 g/dL   HCT 27.9 (L) 39.0 - 52.0 %   MCV 86.4 78.0 - 100.0 fL   MCH 26.9 26.0 - 34.0 pg   MCHC 31.2 30.0 - 36.0 g/dL   RDW 17.5 (H) 11.5 - 15.5 %   Platelets 164 150 - 400 K/uL  Glucose, capillary     Status: None   Collection Time: 03/06/15  7:32 AM  Result Value Ref Range   Glucose-Capillary 74 70 - 99 mg/dL    ABGS No results for input(s): PHART, PO2ART, TCO2, HCO3 in the last 72 hours.  Invalid input(s): PCO2 CULTURES No results found for this or any previous visit (from the past 240 hour(s)). Studies/Results: No results found.  Medications: I have reviewed the patient's current medications.  Assesment:   Active Problems:   DM (diabetes mellitus), type 2, uncontrolled   Iron deficiency anemia   Mental retardation   Acute renal failure   BPH (benign prostatic hypertrophy) with urinary retention    Plan: Medications reviewed Will continue to monitor CBC/BMP Will continue IV hydration as nephrology recommendation Discussed with case manager for possible assisted living placement  since patient has mental retardation and no adequate family support.    LOS: 4 days   Zachary [name] 03/06/2015, 8:20 AM

## 2015-03-06 NOTE — Progress Notes (Signed)
Subjective: Interval History: Patient offers no complaints. He denies any difficulty breathing.  Objective: Vital signs in last 24 hours: Temp:  [98.5 F (36.9 C)-98.6 F (37 C)] 98.6 F (37 C) (05/10 0541) Pulse Rate:  [101-112] 112 (05/10 0541) Resp:  [20] 20 (05/10 0541) BP: (152-181)/(82-100) 156/83 mmHg (05/10 0541) SpO2:  [98 %-100 %] 98 % (05/10 0541) Weight change:   Intake/Output from previous day: 05/09 0701 - 05/10 0700 In: 1620 [I.V.:1620] Out: 1150 [Urine:1150] Intake/Output this shift: Total I/O In: 1988.3 [P.O.:240; I.V.:1748.3] Out: -   General appearance: alert, cooperative and no distress Resp: clear to auscultation bilaterally Cardio: regular rate and rhythm, S1, S2 normal, no murmur, click, rub or gallop GI: soft, non-tender; bowel sounds normal; no masses,  no organomegaly Extremities: extremities normal, atraumatic, no cyanosis or edema  Lab Results:  Recent Labs  03/05/15 0550 03/06/15 0641  WBC 6.3 5.3  HGB 9.7* 8.7*  HCT 30.7* 27.9*  PLT 193 164   BMET:   Recent Labs  03/05/15 0553 03/06/15 0641  NA 145 148*  K 4.4 4.5  CL 118* 123*  CO2 18* 18*  GLUCOSE 93 75  BUN 36* 32*  CREATININE 4.01* 3.67*  CALCIUM 8.8* 8.6*   No results for input(s): PTH in the last 72 hours. Iron Studies:  No results for input(s): IRON, TIBC, TRANSFERRIN, FERRITIN in the last 72 hours.  Studies/Results: No results found.  I have reviewed the patient's current medications.  Assessment/Plan: Problem #1 acute kidney injury: Patient is nonoliguric. Presently his renal function is progressively improving. Problem #2 chronic renal failure: Stage III Problem #3 history of BPH: Status post TURP Problem #4 history of diabetes: His blood sugar is reasonably controlled Problem #5 metabolic acidosis: Patient is on sodium bicarbonate his CO2 is 18 low but stable. Problem #6 anemia: His hemoglobin is better but remains low. Iron saturation and ferritin is low.  Hence iron deficiency anemia. Patient has received 1 dose of IV iron. 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  Problem #8 mental retardation Problem #9 vitamin D deficiency Problem #10 hypernatremia: Possibly from lack of water. Plan: We'll change his IV fluid to half-normal saline. We'll start on sodium bicarbonate 650 mg by mouth twice a day We'll start him on ergocalciferol 50,000 international units by mouth once a week We'll check his basic metabolic panel,and CBC in the morning.   LOS: 4 days   Finn Amos S 03/06/2015,10:08 AM

## 2015-03-07 LAB — RENAL FUNCTION PANEL
Albumin: 3 g/dL — ABNORMAL LOW (ref 3.5–5.0)
Anion gap: 7 (ref 5–15)
BUN: 30 mg/dL — ABNORMAL HIGH (ref 6–20)
CO2: 17 mmol/L — ABNORMAL LOW (ref 22–32)
Calcium: 8.7 mg/dL — ABNORMAL LOW (ref 8.9–10.3)
Chloride: 120 mmol/L — ABNORMAL HIGH (ref 101–111)
Creatinine, Ser: 3.46 mg/dL — ABNORMAL HIGH (ref 0.61–1.24)
GFR calc Af Amer: 20 mL/min — ABNORMAL LOW (ref >60)
GFR calc non Af Amer: 17 mL/min — ABNORMAL LOW (ref >60)
Glucose, Bld: 72 mg/dL (ref 70–99)
Phosphorus: 4.8 mg/dL — ABNORMAL HIGH (ref 2.5–4.6)
Potassium: 4.5 mmol/L (ref 3.5–5.1)
Sodium: 144 mmol/L (ref 135–145)

## 2015-03-07 LAB — CBC
HCT: 27.1 % — ABNORMAL LOW (ref 39.0–52.0)
Hemoglobin: 8.5 g/dL — ABNORMAL LOW (ref 13.0–17.0)
MCH: 27.2 pg (ref 26.0–34.0)
MCHC: 31.4 g/dL (ref 30.0–36.0)
MCV: 86.9 fL (ref 78.0–100.0)
Platelets: 144 10*3/uL — ABNORMAL LOW (ref 150–400)
RBC: 3.12 MIL/uL — ABNORMAL LOW (ref 4.22–5.81)
RDW: 17.3 % — ABNORMAL HIGH (ref 11.5–15.5)
WBC: 5.5 10*3/uL (ref 4.0–10.5)

## 2015-03-07 LAB — GLUCOSE, CAPILLARY
Glucose-Capillary: 66 mg/dL — ABNORMAL LOW (ref 70–99)
Glucose-Capillary: 71 mg/dL (ref 70–99)
Glucose-Capillary: 77 mg/dL (ref 70–99)
Glucose-Capillary: 94 mg/dL (ref 70–99)
Glucose-Capillary: 107 mg/dL — ABNORMAL HIGH (ref 70–99)

## 2015-03-07 LAB — BASIC METABOLIC PANEL

## 2015-03-07 MED ORDER — TUBERCULIN PPD 5 UNIT/0.1ML ID SOLN
5.0000 [IU] | Freq: Once | INTRADERMAL | Status: DC
  Administered 2015-03-07: 5 [IU] via INTRADERMAL
  Filled 2015-03-07: qty 0.1

## 2015-03-07 NOTE — Progress Notes (Signed)
Subjective: Patient is resting. He is receiving iv hydration. His renal functions is gradually improving. He is on the process of placed assisted living. Objective: Vital signs in last 24 hours: Temp:  [98.1 F (36.7 C)-98.5 F (36.9 C)] 98.4 F (36.9 C) (05/11 0535) Pulse Rate:  [97-111] 102 (05/11 0535) Resp:  [18] 18 (05/11 0535) BP: (146-184)/(78-108) 152/78 mmHg (05/11 0535) SpO2:  [95 %-97 %] 97 % (05/11 0535) Weight change:  Last BM Date: 03/06/15  Intake/Output from previous day: 05/10 0701 - 05/11 0700 In: 4737.3 [P.O.:1120; I.V.:3617.3] Out: 726 [Urine:725; Stool:1]  PHYSICAL EXAM General appearance: alert and slowed mentation Resp: clear to auscultation bilaterally Cardio: S1, S2 normal GI: soft, non-tender; bowel sounds normal; no masses,  no organomegaly Extremities: extremities normal, atraumatic, no cyanosis or edema  Lab Results:  Results for orders placed or performed during the hospital encounter of 03/02/15 (from the past 48 hour(s))  Glucose, capillary     Status: None   Collection Time: 03/05/15  8:18 AM  Result Value Ref Range   Glucose-Capillary 90 70 - 99 mg/dL  Glucose, capillary     Status: None   Collection Time: 03/05/15 12:24 PM  Result Value Ref Range   Glucose-Capillary 89 70 - 99 mg/dL   Comment 1 Notify RN    Comment 2 Document in Chart   Glucose, capillary     Status: None   Collection Time: 03/05/15  4:49 PM  Result Value Ref Range   Glucose-Capillary 86 70 - 99 mg/dL   Comment 1 Notify RN   Glucose, capillary     Status: Abnormal   Collection Time: 03/05/15  8:34 PM  Result Value Ref Range   Glucose-Capillary 105 (H) 70 - 99 mg/dL   Comment 1 Notify RN    Comment 2 Document in Chart   Renal function panel     Status: Abnormal   Collection Time: 03/06/15  6:41 AM  Result Value Ref Range   Sodium 148 (H) 135 - 145 mmol/L   Potassium 4.5 3.5 - 5.1 mmol/L   Chloride 123 (H) 101 - 111 mmol/L   CO2 18 (L) 22 - 32 mmol/L   Glucose, Bld 75 70 - 99 mg/dL   BUN 32 (H) 6 - 20 mg/dL   Creatinine, Ser 3.67 (H) 0.61 - 1.24 mg/dL   Calcium 8.6 (L) 8.9 - 10.3 mg/dL   Phosphorus 4.9 (H) 2.5 - 4.6 mg/dL   Albumin 3.1 (L) 3.5 - 5.0 g/dL   GFR calc non Af Amer 16 (L) >60 mL/min   GFR calc Af Amer 18 (L) >60 mL/min    Comment: (NOTE) The eGFR has been calculated using the CKD EPI equation. This calculation has not been validated in all clinical situations. eGFR's persistently <60 mL/min signify possible Chronic Kidney Disease.    Anion gap 7 5 - 15  CBC     Status: Abnormal   Collection Time: 03/06/15  6:41 AM  Result Value Ref Range   WBC 5.3 4.0 - 10.5 K/uL   RBC 3.23 (L) 4.22 - 5.81 MIL/uL   Hemoglobin 8.7 (L) 13.0 - 17.0 g/dL   HCT 27.9 (L) 39.0 - 52.0 %   MCV 86.4 78.0 - 100.0 fL   MCH 26.9 26.0 - 34.0 pg   MCHC 31.2 30.0 - 36.0 g/dL   RDW 17.5 (H) 11.5 - 15.5 %   Platelets 164 150 - 400 K/uL  Glucose, capillary     Status: None   Collection  Time: 03/06/15  7:32 AM  Result Value Ref Range   Glucose-Capillary 74 70 - 99 mg/dL  Glucose, capillary     Status: Abnormal   Collection Time: 03/06/15 11:15 AM  Result Value Ref Range   Glucose-Capillary 103 (H) 70 - 99 mg/dL  Glucose, capillary     Status: None   Collection Time: 03/06/15  4:41 PM  Result Value Ref Range   Glucose-Capillary 86 70 - 99 mg/dL  Glucose, capillary     Status: None   Collection Time: 03/06/15  8:28 PM  Result Value Ref Range   Glucose-Capillary 94 70 - 99 mg/dL  Renal function panel     Status: Abnormal   Collection Time: 03/07/15  6:12 AM  Result Value Ref Range   Sodium 144 135 - 145 mmol/L   Potassium 4.5 3.5 - 5.1 mmol/L   Chloride 120 (H) 101 - 111 mmol/L   CO2 17 (L) 22 - 32 mmol/L   Glucose, Bld 72 70 - 99 mg/dL   BUN 30 (H) 6 - 20 mg/dL   Creatinine, Ser 3.46 (H) 0.61 - 1.24 mg/dL   Calcium 8.7 (L) 8.9 - 10.3 mg/dL   Phosphorus 4.8 (H) 2.5 - 4.6 mg/dL   Albumin 3.0 (L) 3.5 - 5.0 g/dL   GFR calc non Af Amer 17  (L) >60 mL/min   GFR calc Af Amer 20 (L) >60 mL/min    Comment: (NOTE) The eGFR has been calculated using the CKD EPI equation. This calculation has not been validated in all clinical situations. eGFR's persistently <60 mL/min signify possible Chronic Kidney Disease.    Anion gap 7 5 - 15  CBC     Status: Abnormal   Collection Time: 03/07/15  6:13 AM  Result Value Ref Range   WBC 5.5 4.0 - 10.5 K/uL   RBC 3.12 (L) 4.22 - 5.81 MIL/uL   Hemoglobin 8.5 (L) 13.0 - 17.0 g/dL   HCT 27.1 (L) 39.0 - 52.0 %   MCV 86.9 78.0 - 100.0 fL   MCH 27.2 26.0 - 34.0 pg   MCHC 31.4 30.0 - 36.0 g/dL   RDW 17.3 (H) 11.5 - 15.5 %   Platelets 144 (L) 150 - 400 K/uL  Basic metabolic panel     Status: None   Collection Time: 03/07/15  6:13 AM  Result Value Ref Range   Sodium DUPL SEE ACC [id number] 135 - 145 mmol/L  Glucose, capillary     Status: Abnormal   Collection Time: 03/07/15  7:30 AM  Result Value Ref Range   Glucose-Capillary 66 (L) 70 - 99 mg/dL   Comment 1 Notify RN     ABGS No results for input(s): PHART, PO2ART, TCO2, HCO3 in the last 72 hours.  Invalid input(s): PCO2 CULTURES No results found for this or any previous visit (from the past 240 hour(s)). Studies/Results: No results found.  Medications: I have reviewed the patient's current medications.  Assesment:   Active Problems:   DM (diabetes mellitus), type 2, uncontrolled   Iron deficiency anemia   Mental retardation   Acute renal failure   BPH (benign prostatic hypertrophy) with urinary retention    Plan: Medications reviewed Will continue to monitor CBC/BMP Will continue IV hydration as nephrology recommendation PPD for assisted home placement    LOS: 5 days   Zachary [name] 03/07/2015, 8:17 AM

## 2015-03-07 NOTE — Progress Notes (Signed)
Hypoglycemic Event  CBG: 66  Treatment: 15 GM carbohydrate snack  Symptoms: None  Follow-up CBG: Time: 08:10CBG Result: 71  Possible Reasons for Event: Unknown  Comments/MD notified:    Ioanna Colquhoun R  Remember to initiate Hypoglycemia Order Set & complete

## 2015-03-07 NOTE — Clinical Social Work Note (Signed)
Per MD, d/c tomorrow. CSW notified DSS guardian who reports she has spoken with [facility] and plan will be for pt to transfer there tomorrow at d/c. CSW will continue to follow. DSS did meet with pt to further explain situation and they said he appeared to have a better understanding.  Benay Pike, [place]

## 2015-03-07 NOTE — Progress Notes (Signed)
TB skin test placed in left forearm.  Patient tolerated well.  Site is marked.  Will continue to monitor.

## 2015-03-07 NOTE — Progress Notes (Addendum)
Zachary [name]  MRN: [id number]  DOB/AGE: Dec 20, 1945 69 y.o.  Primary Care Physician:FANTA,TESFAYE, MD  Admit date: 03/02/2015  Chief Complaint:  Chief Complaint  Patient presents with  . abnormal blood work     S-Pt presented on  03/02/2015 with  Chief Complaint  Patient presents with  . abnormal blood work   .    Pt offers no new complaints .    Meds . atorvastatin  80 mg Oral q1800  . calcitRIOL  0.25 mcg Oral Daily  . carvedilol  6.25 mg Oral BID WC  . ferumoxytol  510 mg Intravenous Weekly  . insulin aspart  0-5 Units Subcutaneous QHS  . insulin aspart  0-9 Units Subcutaneous TID WC  . isosorbide-hydrALAZINE  1 tablet Oral TID  . levothyroxine  75 mcg Oral Q24H  . sodium bicarbonate  1,300 mg Oral BID  . tamsulosin  0.4 mg Oral QPC supper  . tuberculin  5 Units Intradermal Once  . Vitamin D (Ergocalciferol)  50,000 Units Oral Q7 days     Physical Exam: Vital signs in last 24 hours: Temp:  [98.1 F (36.7 C)-98.5 F (36.9 C)] 98.4 F (36.9 C) (05/11 0535) Pulse Rate:  [97-111] 102 (05/11 0535) Resp:  [18] 18 (05/11 0535) BP: (146-184)/(78-108) 152/78 mmHg (05/11 0535) SpO2:  [95 %-97 %] 97 % (05/11 0535) Weight change:  Last BM Date: 03/06/15  Intake/Output from previous day: 05/10 0701 - 05/11 0700 In: 4737.3 [P.O.:1120; I.V.:3617.3] Out: 726 [Urine:725; Stool:1] Total I/O In: 240 [P.O.:240] Out: 200 [Urine:200]   Physical Exam: General- pt is awake,alert, follow commands Resp- No acute REsp distress, CTA B/L NO Rhonchi CVS- S1S2 regular in rate and rhythm GIT- BS+, soft, NT, ND EXT- trace LE Edema, NO Cyanosis   Lab Results: CBC  Recent Labs  03/06/15 0641 03/07/15 0613  WBC 5.3 5.5  HGB 8.7* 8.5*  HCT 27.9* 27.1*  PLT 164 144*    BMET  Recent Labs  03/06/15 0641 03/07/15 0612 03/07/15 0613  NA 148* 144 DUPL SEE ACC [id number]  K 4.5 4.5  --   CL 123* 120*  --   CO2 18* 17*  --   GLUCOSE 75 72  --   BUN 32* 30*  --    CREATININE 3.67* 3.46*  --   CALCIUM 8.6* 8.7*  --    Trend Creat 2016  3.69=>4.23=>3.67=>3.46 2015   3.23==>1.67  MICRO No results found for this or any previous visit (from the past 240 hour(s)).    Lab Results  Component Value Date   PTH 130* 03/03/2015   PTH Comment 03/03/2015   CALCIUM 8.7* 03/07/2015   CAION 1.17 12/25/2013   PHOS 4.8* 03/07/2015          Renal u/s in 2015 1. Moderate bilateral hydronephrosis. There is also evidence of  hydroureter bilaterally. No obstructing masses nor appreciable  calculi identified. Clinical correlation recommended and if  clinically warranted further evaluation with renal protocol CT is  recommended.  2. Trabeculated bladder wall and enlarged prostate. These findings  may reflect sequela of a component of bladder outlet obstruction.  Clinical correlation recommended.  Renal u/s in 2016 Right Kidney 14.6 cm. Diffusely echogenic. No significant change in dilatation of the renal collecting system.  Left Kidney:: 13.6 cm. Diffusely echogenic. No significant change in dilatation of the renal collecting system.  Bladder:Distended, with a calculated prevoid volume of 1137 cc and postvoid volume of 836 cc. The previously seen bladder mass is no  longer visualized.  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  on Calcitriol Phosphorus at goal.   5)CNS-Hx of mental Retardation  PMD following  6)Electrolytes Normokalemic Hypernatremia    better   7)Acid base Co2 not  at  goal On PO Bicarb    Plan:  Will reduce IVf rate will follow Bmet     [place] 03/07/2015, 9:02 AM

## 2015-03-07 NOTE — Clinical Social Work Placement (Signed)
   CLINICAL SOCIAL WORK PLACEMENT  NOTE  Date:  03/07/2015  Patient Details  Name: Zachary [name] MRN: [id number] Date of Birth: [birth year]  Clinical Social Work is seeking post-discharge placement for this patient at the [place] level of care (*CSW will initial, date and re-position this form in  chart as items are completed):  Yes   Patient/family provided with [place] Work Department's list of facilities offering this level of care within the geographic area requested by the patient (or if unable, by the patient's family).  Yes   Patient/family informed of their freedom to choose among providers that offer the needed level of care, that participate in Medicare, Medicaid or managed care program needed by the patient, have an available bed and are willing to accept the patient.  Yes   Patient/family informed of [facility]'s ownership interest in [facility] and [facility], as well as of the fact that they are under no obligation to receive care at these facilities.  PASRR submitted to EDS on 03/05/15     PASRR number received on 03/05/15     Existing PASRR number confirmed on       FL2 transmitted to all facilities in geographic area requested by pt/family on 03/05/15     FL2 transmitted to all facilities within larger geographic area on       Patient informed that his/her managed care company has contracts with or will negotiate with certain facilities, including the following:        Yes   Patient/family informed of bed offers received.  Patient chooses bed at Other - please specify in the comment section below:     Physician recommends and patient chooses bed at      Patient to be transferred to   on  .  Patient to be transferred to facility by       Patient family notified on   of transfer.  Name of family member notified:        PHYSICIAN       Additional Comment:   [facility]  _______________________________________________ Salome Arnt, [place] 03/07/2015, 10:50 AM [phone]

## 2015-03-08 LAB — CBC
HCT: 27.2 % — ABNORMAL LOW (ref 39.0–52.0)
Hemoglobin: 8.6 g/dL — ABNORMAL LOW (ref 13.0–17.0)
MCH: 27.6 pg (ref 26.0–34.0)
MCHC: 31.6 g/dL (ref 30.0–36.0)
MCV: 87.2 fL (ref 78.0–100.0)
Platelets: 143 10*3/uL — ABNORMAL LOW (ref 150–400)
RBC: 3.12 MIL/uL — ABNORMAL LOW (ref 4.22–5.81)
RDW: 17.4 % — ABNORMAL HIGH (ref 11.5–15.5)
WBC: 4.4 10*3/uL (ref 4.0–10.5)

## 2015-03-08 LAB — RENAL FUNCTION PANEL
Albumin: 3 g/dL — ABNORMAL LOW (ref 3.5–5.0)
Anion gap: 4 — ABNORMAL LOW (ref 5–15)
BUN: 29 mg/dL — ABNORMAL HIGH (ref 6–20)
CO2: 19 mmol/L — ABNORMAL LOW (ref 22–32)
Calcium: 8.7 mg/dL — ABNORMAL LOW (ref 8.9–10.3)
Chloride: 122 mmol/L — ABNORMAL HIGH (ref 101–111)
Creatinine, Ser: 3.47 mg/dL — ABNORMAL HIGH (ref 0.61–1.24)
GFR calc Af Amer: 20 mL/min — ABNORMAL LOW (ref >60)
GFR calc non Af Amer: 17 mL/min — ABNORMAL LOW (ref >60)
Glucose, Bld: 83 mg/dL (ref 65–99)
Phosphorus: 4.5 mg/dL (ref 2.5–4.6)
Potassium: 4.8 mmol/L (ref 3.5–5.1)
Sodium: 145 mmol/L (ref 135–145)

## 2015-03-08 LAB — GLUCOSE, CAPILLARY: Glucose-Capillary: 79 mg/dL (ref 65–99)

## 2015-03-08 MED ORDER — ISOSORB DINITRATE-HYDRALAZINE 20-37.5 MG PO TABS: 1.0000 | ORAL_TABLET | Freq: Three times a day (TID) | ORAL | Status: DC

## 2015-03-08 MED ORDER — CARVEDILOL 6.25 MG PO TABS: 6.2500 mg | ORAL_TABLET | Freq: Two times a day (BID) | ORAL | Status: DC

## 2015-03-08 MED ORDER — VITAMIN D (ERGOCALCIFEROL) 1.25 MG (50000 UNIT) PO CAPS: 50000.0000 [IU] | ORAL_CAPSULE | ORAL | Status: DC

## 2015-03-08 NOTE — Discharge Summary (Signed)
Physician Discharge Summary  Patient ID: NEKO KOMISAR MRN: [id number] DOB/AGE: [birth year] 69 y.o. Primary Care Physician:Legacy Carrender, MD Admit date: 03/02/2015 Discharge date: 03/08/2015    Discharge Diagnoses:   Active Problems:   DM (diabetes mellitus), type 2, uncontrolled   Iron deficiency anemia   Mental retardation   Acute renal failure   BPH (benign prostatic hypertrophy) with urinary retention     Medication List    TAKE these medications        carvedilol 6.25 MG tablet  Commonly known as:  COREG  Take 1 tablet (6.25 mg total) by mouth 2 (two) times daily with a meal.     ferrous sulfate 325 (65 FE) MG tablet  Take 325 mg by mouth daily with breakfast.     isosorbide-hydrALAZINE 20-37.5 MG per tablet  Commonly known as:  BIDIL  Take 1 tablet by mouth 3 (three) times daily.     levothyroxine 75 MCG tablet  Commonly known as:  SYNTHROID, LEVOTHROID  Take 75 mcg by mouth daily.     linagliptin 5 MG Tabs tablet  Commonly known as:  TRADJENTA  Take 5 mg by mouth daily.     niacin 1000 MG CR tablet  Commonly known as:  NIASPAN  Take 1,000 mg by mouth at bedtime.     simvastatin 20 MG tablet  Commonly known as:  ZOCOR  Take 20 mg by mouth daily.     Vitamin D (Ergocalciferol) 50000 UNITS Caps capsule  Commonly known as:  DRISDOL  Take 1 capsule (50,000 Units total) by mouth every 7 (seven) days.        Discharged Condition: improved    Consults: nephrology  Significant Diagnostic Studies: US Renal  03/02/2015   CLINICAL DATA:  Acute kidney injury.  EXAM: RENAL / URINARY TRACT ULTRASOUND COMPLETE  COMPARISON:  12/30/2013.  FINDINGS: Right Kidney:  Length: 14.6 cm. Diffusely echogenic. No significant change in dilatation of the renal collecting system.  Left Kidney:  Length: 13.6 cm. Diffusely echogenic. No significant change in dilatation of the renal collecting system.  Bladder:  Distended, with a calculated prevoid volume of 1137 cc and postvoid  volume of 836 cc. The previously seen bladder mass is no longer visualized.  IMPRESSION: 1. No significant change in moderate-to-marked bilateral hydronephrosis. 2. Diffusely echogenic kidneys compatible with medical renal disease. 3. Distended urinary bladder with a large postvoid residual. 4. The previously seen bladder mass is no longer visualized.   Electronically Signed   By: Claudie Revering M.D.   On: 03/02/2015 17:00    Lab Results: Basic Metabolic Panel:  Recent Labs  03/06/15 0641 03/07/15 0612 03/07/15 0613  NA 148* 144 DUPL SEE ACC [id number]  K 4.5 4.5  --   CL 123* 120*  --   CO2 18* 17*  --   GLUCOSE 75 72  --   BUN 32* 30*  --   CREATININE 3.67* 3.46*  --   CALCIUM 8.6* 8.7*  --   PHOS 4.9* 4.8*  --    Liver Function Tests:  Recent Labs  03/06/15 0641 03/07/15 0612  ALBUMIN 3.1* 3.0*     CBC:  Recent Labs  03/07/15 0613 03/08/15 0747  WBC 5.5 4.4  HGB 8.5* 8.6*  HCT 27.1* 27.2*  MCV 86.9 87.2  PLT 144* 143*    No results found for this or any previous visit (from the past 240 hour(s)).   Hospital Course:   This is a 69 years old male with  history of multiple medical illnesses was admitted due to abnormal labs that was on the out patient. Patient was seen in my office follow. Patient previously missed his appointment and he was not seen for almost a year,. He was not taking his medications regularly. He was found to have a hemoglobin less than 7 and elevated BUN and creatine. Patient was admitted and nephrology consult was done. He transfused and also received IV fluid. His renal function gradually improved and his Hemoglobin increased to 8.6. Patient is started on iron tablets. Arrangement is made for placement in an assisted living.  Discharge Exam: Blood pressure 150/86, pulse 94, temperature 98.2 F (36.8 C), temperature source Oral, resp. rate 18, height 5\' 6"  (1.676 m), weight 89.359 kg (197 lb), SpO2 99 %.    Disposition:  Assisted  living.      Signed: Brysen Shankman   03/08/2015, 8:16 AM

## 2015-03-08 NOTE — Care Management Note (Signed)
Case Management Note  Patient Details  Name: Zachary [name] MRN: [id number] Date of Birth: [birth year]   Expected Discharge Date:                  Expected Discharge Plan:  Assisted Living / Rest Home  In-House Referral:  Clinical Social Work  Discharge planning Services  CM Consult  Post Acute Care Choice:    Choice offered to:     DME Arranged:    DME Agency:     HH Arranged:    [place] Agency:     Status of Service:  In process, will continue to follow  Medicare Important Message Given:  Yes Date Medicare IM Given:  03/08/15 Medicare IM give by:  Jolene Provost, RN, MSN, CM  Date Additional Medicare IM Given:    Additional Medicare Important Message give by:     If discussed at [place] of Stay Meetings, dates discussed:    Additional Comments: Pt discharging to ALF today. CSW has arranged for placement. No CM needs.   Sherald Barge, RN 03/08/2015, 8:46 AM

## 2015-03-08 NOTE — Progress Notes (Signed)
Pt discharged to [facility] today per Dr. Legrand Rams.  Pt's IV site D/C'd and WDL.  Pt's VSS.  FL2 packet given to [facility] staff transporter.  Verbalized understanding.  Pt left floor via WC in stable condition accompanied by NT.

## 2015-03-08 NOTE — Progress Notes (Signed)
BASILE CDEBACA  MRN: [id number]  DOB/AGE: [birth year] 68 y.o.  Primary Care Physician:FANTA,TESFAYE, MD  Admit date: 03/02/2015  Chief Complaint:  Chief Complaint  Patient presents with  . abnormal blood work     S-Pt presented on  03/02/2015 with  Chief Complaint  Patient presents with  . abnormal blood work   .    Pt offers no new complaints .    Pt says " I am happy as I am getting discharged"    Meds . atorvastatin  80 mg Oral q1800  . calcitRIOL  0.25 mcg Oral Daily  . carvedilol  6.25 mg Oral BID WC  . ferumoxytol  510 mg Intravenous Weekly  . insulin aspart  0-5 Units Subcutaneous QHS  . insulin aspart  0-9 Units Subcutaneous TID WC  . isosorbide-hydrALAZINE  1 tablet Oral TID  . levothyroxine  75 mcg Oral Q24H  . sodium bicarbonate  1,300 mg Oral BID  . tamsulosin  0.4 mg Oral QPC supper  . tuberculin  5 Units Intradermal Once  . Vitamin D (Ergocalciferol)  50,000 Units Oral Q7 days     Physical Exam: Vital signs in last 24 hours: Temp:  [97.8 F (36.6 C)-98.7 F (37.1 C)] 98.2 F (36.8 C) (05/12 0642) Pulse Rate:  [93-98] 94 (05/12 0642) Resp:  [18-20] 18 (05/12 0642) BP: (136-173)/(68-120) 150/86 mmHg (05/12 0642) SpO2:  [99 %-100 %] 99 % (05/12 [id number]) Weight change:  Last BM Date: 03/07/15  Intake/Output from previous day: 05/11 0701 - 05/12 0700 In: 2553.8 [P.O.:480; I.V.:2073.8] Out: 500 [Urine:500]     Physical Exam: General- pt is awake,alert, follow commands Resp- No acute REsp distress, CTA B/L NO Rhonchi CVS- S1S2 regular in rate and rhythm GIT- BS+, soft, NT, ND EXT- NO LE Edema, NO Cyanosis   Lab Results: CBC  Recent Labs  03/07/15 0613 03/08/15 0747  WBC 5.5 4.4  HGB 8.5* 8.6*  HCT 27.1* 27.2*  PLT 144* 143*    BMET  Recent Labs  03/07/15 0612 03/07/15 0613 03/08/15 0746  NA 144 DUPL SEE ACC [id number] 145  K 4.5  --  4.8  CL 120*  --  122*  CO2 17*  --  19*  GLUCOSE 72  --  83  BUN 30*  --  29*  CREATININE 3.46*   --  3.47*  CALCIUM 8.7*  --  8.7*   Trend Creat 2016  3.69=>4.23=>3.67=>3.46--3.47 2015   3.23==>1.67  MICRO No results found for this or any previous visit (from the past 240 hour(s)).    Lab Results  Component Value Date   PTH 130* 03/03/2015   PTH Comment 03/03/2015   CALCIUM 8.7* 03/08/2015   CAION 1.17 12/25/2013   PHOS 4.5 03/08/2015          Renal u/s in 2015 1. Moderate bilateral hydronephrosis. There is also evidence of  hydroureter bilaterally. No obstructing masses nor appreciable  calculi identified. Clinical correlation recommended and if  clinically warranted further evaluation with renal protocol CT is  recommended.  2. Trabeculated bladder wall and enlarged prostate. These findings  may reflect sequela of a component of bladder outlet obstruction.  Clinical correlation recommended.  Renal u/s in 2016 Right Kidney 14.6 cm. Diffusely echogenic. No significant change in dilatation of the renal collecting system.  Left Kidney:: 13.6 cm. Diffusely echogenic. No significant change in dilatation of the renal collecting system.  Bladder:Distended, with a calculated prevoid volume of 1137 cc and postvoid volume of 836 cc.  The previously seen bladder mass is no longer visualized.  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  on Calcitriol Phosphorus at goal.   5)CNS-Hx of mental Retardation  PMD following  6)Electrolytes Normokalemic Hypernatremia    better   7)Acid base Co2 not  at goal but  improving On PO Bicarb    Plan:  Will continue current tx     Corky Blumstein S 03/08/2015, 8:53 AM

## 2015-03-08 NOTE — Clinical Social Work Note (Signed)
Pt d/c today to [facility]. Pt, DSS guardian, and facility aware and agreeable. Facility will contact MD regarding scripts and will pick up pt later this morning. FL2 and D/C summary faxed.  Benay Pike, [place]

## 2015-03-08 NOTE — Clinical Social Work Placement (Signed)
   CLINICAL SOCIAL WORK PLACEMENT  NOTE  Date:  03/08/2015  Patient Details  Name: Zachary [name] MRN: [id number] Date of Birth: Apr 16, 1946  Clinical Social Work is seeking post-discharge placement for this patient at the [place] level of care (*CSW will initial, date and re-position this form in  chart as items are completed):  Yes   Patient/family provided with [place] Work Department's list of facilities offering this level of care within the geographic area requested by the patient (or if unable, by the patient's family).  Yes   Patient/family informed of their freedom to choose among providers that offer the needed level of care, that participate in Medicare, Medicaid or managed care program needed by the patient, have an available bed and are willing to accept the patient.  Yes   Patient/family informed of [facility]'s ownership interest in [facility] and [facility], as well as of the fact that they are under no obligation to receive care at these facilities.  PASRR submitted to EDS on 03/05/15     PASRR number received on 03/05/15     Existing PASRR number confirmed on       FL2 transmitted to all facilities in geographic area requested by pt/family on 03/05/15     FL2 transmitted to all facilities within larger geographic area on       Patient informed that his/her managed care company has contracts with or will negotiate with certain facilities, including the following:        Yes   Patient/family informed of bed offers received.  Patient chooses bed at Other - please specify in the comment section below:     Physician recommends and patient chooses bed at      Patient to be transferred to Other - please specify in the comment section below: on 03/08/15.  Patient to be transferred to facility by [facility]     Patient family notified on 03/08/15 of transfer.  Name of family member notified:  Legal guardian- Carlis Stable at [facility]       Additional Comment:    _______________________________________________ Salome Arnt, [place] 03/08/2015, 8:51 AM [phone]

## 2015-03-14 DIAGNOSIS — E1165 Type 2 diabetes mellitus with hyperglycemia: Secondary | ICD-10-CM | POA: Diagnosis not present

## 2015-03-14 DIAGNOSIS — N184 Chronic kidney disease, stage 4 (severe): Secondary | ICD-10-CM | POA: Diagnosis not present

## 2015-03-14 DIAGNOSIS — F79 Unspecified intellectual disabilities: Secondary | ICD-10-CM | POA: Diagnosis not present

## 2015-03-14 DIAGNOSIS — D649 Anemia, unspecified: Secondary | ICD-10-CM | POA: Diagnosis not present

## 2015-03-15 DIAGNOSIS — D649 Anemia, unspecified: Secondary | ICD-10-CM | POA: Diagnosis not present

## 2015-03-15 DIAGNOSIS — F79 Unspecified intellectual disabilities: Secondary | ICD-10-CM | POA: Diagnosis not present

## 2015-03-15 DIAGNOSIS — E1165 Type 2 diabetes mellitus with hyperglycemia: Secondary | ICD-10-CM | POA: Diagnosis not present

## 2015-03-15 DIAGNOSIS — N184 Chronic kidney disease, stage 4 (severe): Secondary | ICD-10-CM | POA: Diagnosis not present

## 2015-03-15 DIAGNOSIS — E039 Hypothyroidism, unspecified: Secondary | ICD-10-CM | POA: Diagnosis not present

## 2015-03-20 DIAGNOSIS — E1129 Type 2 diabetes mellitus with other diabetic kidney complication: Secondary | ICD-10-CM | POA: Diagnosis not present

## 2015-03-20 DIAGNOSIS — E875 Hyperkalemia: Secondary | ICD-10-CM | POA: Diagnosis not present

## 2015-03-20 DIAGNOSIS — D649 Anemia, unspecified: Secondary | ICD-10-CM | POA: Diagnosis not present

## 2015-03-20 DIAGNOSIS — N179 Acute kidney failure, unspecified: Secondary | ICD-10-CM | POA: Diagnosis not present

## 2015-03-28 ENCOUNTER — Encounter (HOSPITAL_COMMUNITY): Payer: Medicare Other

## 2015-03-28 ENCOUNTER — Other Ambulatory Visit: Payer: Self-pay

## 2015-03-28 ENCOUNTER — Other Ambulatory Visit (HOSPITAL_COMMUNITY): Payer: Medicare Other

## 2015-03-28 ENCOUNTER — Ambulatory Visit: Payer: Medicare Other | Admitting: Vascular Surgery

## 2015-03-28 ENCOUNTER — Encounter: Payer: Self-pay | Admitting: Vascular Surgery

## 2015-03-28 ENCOUNTER — Ambulatory Visit (HOSPITAL_COMMUNITY)
Admission: RE | Admit: 2015-03-28 | Discharge: 2015-03-28 | Disposition: A | Discharge status: Home / Self Care | Payer: Medicare Other | Source: Ambulatory Visit | Attending: Vascular Surgery | Admitting: Vascular Surgery

## 2015-03-28 ENCOUNTER — Ambulatory Visit (INDEPENDENT_AMBULATORY_CARE_PROVIDER_SITE_OTHER)
Admission: RE | Admit: 2015-03-28 | Discharge: 2015-03-28 | Disposition: A | Discharge status: Home / Self Care | Payer: Medicare Other | Source: Ambulatory Visit | Attending: Vascular Surgery | Admitting: Vascular Surgery

## 2015-03-28 ENCOUNTER — Ambulatory Visit (INDEPENDENT_AMBULATORY_CARE_PROVIDER_SITE_OTHER): Payer: Medicare Other | Admitting: Vascular Surgery

## 2015-03-28 VITALS — BP 168/96 | HR 85 | Ht 66.0 in | Wt 217.0 lb

## 2015-03-28 DIAGNOSIS — N185 Chronic kidney disease, stage 5: Secondary | ICD-10-CM | POA: Diagnosis not present

## 2015-03-28 DIAGNOSIS — Z01810 Encounter for preprocedural cardiovascular examination: Secondary | ICD-10-CM | POA: Diagnosis not present

## 2015-03-28 DIAGNOSIS — N184 Chronic kidney disease, stage 4 (severe): Secondary | ICD-10-CM | POA: Diagnosis not present

## 2015-03-28 DIAGNOSIS — Z01818 Encounter for other preprocedural examination: Secondary | ICD-10-CM | POA: Diagnosis not present

## 2015-03-28 NOTE — Progress Notes (Signed)
Vascular and Vein Specialist of [facility]  Patient name: Zachary [name] MRN: [id number] DOB: 10/19/1946 Sex: male  REASON FOR CONSULT: Evaluate for hemodialysis access  HPI: Zachary [name] is a 69 y.o. male with a history of mental retardation who lives in a skilled nursing facility. He is here today with his appointed guardian. He is not a good historian. 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 He denies any recent uremic symptoms including nausea, vomiting, fatigue, anorexia, or palpitations. He is right-handed. He is not yet on dialysis.  I have reviewed the records that were sent with him. He has a history of congestive heart failure, hypertension, hyperkalemia, anemia, and stage IV chronic kidney disease. His GFR on 03/15/2015 was 13.   Past Medical History  Diagnosis Date  . Hypertension   . Diabetes mellitus   . Smoking   . Abnormal CT scan, kidney 10/06/2011  . UTI (lower urinary tract infection) 10/06/2011  . Bladder wall thickening 10/06/2011  . Acute pyelonephritis 10/07/2011  . 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 Poor historian poor historian  . BPH (benign prostatic hypertrophy)    Family History  Problem Relation Age of Onset  . Cancer Mother    SOCIAL HISTORY: History  Substance Use Topics  . Smoking status: Never Smoker   . Smokeless tobacco: Never Used  . Alcohol Use: 0.0 oz/week    0 Standard drinks or equivalent per week     Comment: occ. use    No Known Allergies Current Outpatient Prescriptions  Medication Sig Dispense Refill  . carvedilol (COREG) 6.25 MG tablet Take 1 tablet (6.25 mg total) by mouth 2 (two) times daily with a meal. 60 tablet 3  . ferrous sulfate 325 (65 FE) MG tablet Take 325 mg by mouth daily with breakfast.    . isosorbide-hydrALAZINE (BIDIL) 20-37.5 MG per tablet Take 1 tablet by mouth 3 (three) times daily. 90 tablet 3  . levothyroxine (SYNTHROID, LEVOTHROID) 75 MCG  tablet Take 75 mcg by mouth daily.    Marland Kitchen linagliptin (TRADJENTA) 5 MG TABS tablet Take 5 mg by mouth daily.    . niacin (NIASPAN) 1000 MG CR tablet Take 1,000 mg by mouth at bedtime.    . simvastatin (ZOCOR) 20 MG tablet Take 20 mg by mouth daily.    Marland Kitchen torsemide (DEMADEX) 20 MG tablet Take 20 mg by mouth daily.     . Vitamin D, Ergocalciferol, (DRISDOL) 50000 UNITS CAPS capsule Take 1 capsule (50,000 Units total) by mouth every 7 (seven) days. 30 capsule 3   No current facility-administered medications for this visit.   REVIEW OF SYSTEMS: Valu.Nieves ] denotes positive finding; [  ] denotes negative finding  CARDIOVASCULAR:  [ ]  chest pain   [ ]  chest pressure   [ ]  palpitations   [ ]  orthopnea   [ ]  dyspnea on exertion   [ ]  claudication   [ ]  rest pain   [ ]  DVT   [ ]  phlebitis PULMONARY:   [ ]  productive cough   [ ]  asthma   [ ]  wheezing NEUROLOGIC:   [ ]  weakness  [ ]  paresthesias  [ ]  aphasia  [ ]  amaurosis  [ ]  dizziness HEMATOLOGIC:   [ ]  bleeding problems   [ ]  clotting disorders MUSCULOSKELETAL:  [ ]  joint pain   [ ]  joint swelling [ ]  leg swelling GASTROINTESTINAL: [ ]   blood in stool  [ ]   hematemesis GENITOURINARY:  [ ]   dysuria  [ ]   hematuria PSYCHIATRIC:  [ ]  history of major depression INTEGUMENTARY:  [ ]  rashes  [ ]  ulcers CONSTITUTIONAL:  [ ]  fever   [ ]  chills  PHYSICAL EXAM: Filed Vitals:   03/28/15 1159  BP: 168/96  Pulse: 85  Height: 5\' 6"  (1.676 m)  Weight: 217 lb (98.431 kg)  SpO2: 97%   GENERAL: The patient is a well-nourished male, in no acute distress. The vital signs are documented above. CARDIOVASCULAR: There is a regular rate and rhythm. I do not detect carotid bruits. He has palpable radial and brachial pulses bilaterally. PULMONARY: There is good air exchange bilaterally without wheezing or rales. ABDOMEN: Soft and non-tender with normal pitched bowel sounds.  MUSCULOSKELETAL: There are no major deformities or cyanosis. NEUROLOGIC: No focal weakness or  paresthesias are detected. SKIN: There are no ulcers or rashes noted. PSYCHIATRIC: The patient has a normal affect.  DATA:  I have independently interpreted his upper extremity vein mapping. On the right side, the forearm and upper arm cephalic vein looked marginal in size. The basilic vein may potentially be usable. On the left side, the forearm and upper arm cephalic vein are marginal in size. The basilic vein is also marginal in size.  I have independently interpreted his upper extremity arterial Doppler study which shows triphasic radial and ulnar waveforms bilaterally.  MEDICAL ISSUES: STAGE IV CHRONIC KIDNEY DISEASE: His veins are marginal in size and he may not be a candidate for fistula. However I have recommended placement of a left AV fistula or AV graft. I explained the procedure to the patient and his appointed guardian. I have explained the indications for placement of an AV fistula or AV graft. I've explained that if at all possible we will place an AV fistula.  I have reviewed the risks of placement of an AV fistula including but not limited to: failure of the fistula to mature, need for subsequent interventions, and thrombosis. In addition I have reviewed the potential complications of placement of an AV graft. These risks include, but are not limited to, graft thrombosis, graft infection, wound healing problems, bleeding, arm swelling, and steal syndrome. All the patient's questions were answered and they are agreeable to proceed with surgery. His surgery is scheduled for 04/10/2015.   [name] [facility] of [place]: [phone]

## 2015-04-03 ENCOUNTER — Encounter: Payer: Self-pay | Admitting: Internal Medicine

## 2015-04-10 ENCOUNTER — Ambulatory Visit (HOSPITAL_COMMUNITY): Payer: Medicare Other | Admitting: Certified Registered Nurse Anesthetist

## 2015-04-10 ENCOUNTER — Encounter (HOSPITAL_COMMUNITY): Payer: Self-pay | Admitting: *Deleted

## 2015-04-10 ENCOUNTER — Encounter (HOSPITAL_COMMUNITY)
Admission: RE | Disposition: A | Discharge status: Home / Self Care | Payer: Self-pay | Source: Ambulatory Visit | Attending: Vascular Surgery

## 2015-04-10 ENCOUNTER — Ambulatory Visit (HOSPITAL_COMMUNITY)
Admission: RE | Admit: 2015-04-10 | Discharge: 2015-04-10 | Disposition: A | Discharge status: Home / Self Care | Payer: Medicare Other | Source: Ambulatory Visit | Attending: Vascular Surgery | Admitting: Vascular Surgery

## 2015-04-10 DIAGNOSIS — F79 Unspecified intellectual disabilities: Secondary | ICD-10-CM | POA: Diagnosis not present

## 2015-04-10 DIAGNOSIS — N184 Chronic kidney disease, stage 4 (severe): Secondary | ICD-10-CM | POA: Diagnosis not present

## 2015-04-10 DIAGNOSIS — Z79899 Other long term (current) drug therapy: Secondary | ICD-10-CM | POA: Insufficient documentation

## 2015-04-10 DIAGNOSIS — D649 Anemia, unspecified: Secondary | ICD-10-CM | POA: Insufficient documentation

## 2015-04-10 DIAGNOSIS — Z5309 Procedure and treatment not carried out because of other contraindication: Secondary | ICD-10-CM | POA: Insufficient documentation

## 2015-04-10 DIAGNOSIS — I129 Hypertensive chronic kidney disease with stage 1 through stage 4 chronic kidney disease, or unspecified chronic kidney disease: Secondary | ICD-10-CM | POA: Diagnosis not present

## 2015-04-10 DIAGNOSIS — E875 Hyperkalemia: Secondary | ICD-10-CM | POA: Insufficient documentation

## 2015-04-10 DIAGNOSIS — I509 Heart failure, unspecified: Secondary | ICD-10-CM | POA: Diagnosis not present

## 2015-04-10 DIAGNOSIS — E119 Type 2 diabetes mellitus without complications: Secondary | ICD-10-CM | POA: Insufficient documentation

## 2015-04-10 HISTORY — DX: Anxiety disorder, unspecified: F41.9

## 2015-04-10 HISTORY — DX: Hypothyroidism, unspecified: E03.9

## 2015-04-10 HISTORY — DX: Anemia, unspecified: D64.9

## 2015-04-10 LAB — POCT I-STAT 4, (NA,K, GLUC, HGB,HCT)
Glucose, Bld: 114 mg/dL — ABNORMAL HIGH (ref 65–99)
HCT: 25 % — ABNORMAL LOW (ref 39.0–52.0)
Hemoglobin: 8.5 g/dL — ABNORMAL LOW (ref 13.0–17.0)
Potassium: 6.1 mmol/L (ref 3.5–5.1)
Sodium: 143 mmol/L (ref 135–145)

## 2015-04-10 SURGERY — ARTERIOVENOUS (AV) FISTULA CREATION
Anesthesia: Choice | Laterality: Left

## 2015-04-10 MED ORDER — THROMBIN 20000 UNITS EX SOLR
CUTANEOUS | Status: AC
  Filled 2015-04-10: qty 20000

## 2015-04-10 MED ORDER — LACTATED RINGERS IV SOLN
INTRAVENOUS | Status: DC
  Administered 2015-04-10: 10:00:00 via INTRAVENOUS

## 2015-04-10 MED ORDER — DEXTROSE 5 % IV SOLN: 1.5000 g | INTRAVENOUS | Status: DC

## 2015-04-10 MED ORDER — SODIUM CHLORIDE 0.9 % IV SOLN
INTRAVENOUS | Status: DC
Start: 2015-04-10 — End: 2015-04-12

## 2015-04-10 MED ORDER — SODIUM POLYSTYRENE SULFONATE 15 GM/60ML PO SUSP
30.0000 g | Freq: Once | ORAL | Status: DC
  Filled 2015-04-10: qty 120

## 2015-04-10 MED ORDER — LIDOCAINE HCL (PF) 1 % IJ SOLN
INTRAMUSCULAR | Status: AC
  Filled 2015-04-10: qty 30

## 2015-04-10 MED ORDER — DEXTROSE 5 % IV SOLN
INTRAVENOUS | Status: AC
  Filled 2015-04-10: qty 1.5

## 2015-04-10 MED ORDER — CHLORHEXIDINE GLUCONATE CLOTH 2 % EX PADS: 6.0000 | MEDICATED_PAD | Freq: Once | CUTANEOUS | Status: DC

## 2015-04-10 NOTE — Progress Notes (Signed)
Pt seen in preop. BP high and K+ 6.1 on preop labs. Obtained EKG. Compared to prior ~ 1 month ago T-waves diffusely peaked. Discussed with Dr. Scot Dock. Will treat hyperkalemia, D/C home and F/U with PCP for further hyperkalemia.

## 2015-04-10 NOTE — Interval H&P Note (Signed)
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 After consideration of risks, benefits and other options for treatment, the patient has consented to  Procedure(s): ARTERIOVENOUS (AV) FISTULA CREATION VERSUS GRAFT INSERTION (Left) as a surgical intervention .  [name] history has been reviewed, patient examined, no change in status, stable for surgery.  I have reviewed the patient's chart and labs.  Questions were answered to the patient's satisfaction.     [name]

## 2015-04-10 NOTE — H&P (View-Only) (Signed)
Vascular and Vein Specialist of [facility]  Patient name: Zachary [name] MRN: [id number] DOB: May 29, 1946 Sex: male  REASON FOR CONSULT: Evaluate for hemodialysis access  HPI: Zachary [name] is a 69 y.o. male with a history of mental retardation who lives in a skilled nursing facility. He is here today with his appointed guardian. He is not a good historian. 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 He denies any recent uremic symptoms including nausea, vomiting, fatigue, anorexia, or palpitations. He is right-handed. He is not yet on dialysis.  I have reviewed the records that were sent with him. He has a history of congestive heart failure, hypertension, hyperkalemia, anemia, and stage IV chronic kidney disease. His GFR on 03/15/2015 was 13.   Past Medical History  Diagnosis Date  . Hypertension   . Diabetes mellitus   . Smoking   . Abnormal CT scan, kidney 10/06/2011  . UTI (lower urinary tract infection) 10/06/2011  . Bladder wall thickening 10/06/2011  . Acute pyelonephritis 10/07/2011  . 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 Poor historian poor historian  . BPH (benign prostatic hypertrophy)    Family History  Problem Relation Age of Onset  . Cancer Mother    SOCIAL HISTORY: History  Substance Use Topics  . Smoking status: Never Smoker   . Smokeless tobacco: Never Used  . Alcohol Use: 0.0 oz/week    0 Standard drinks or equivalent per week     Comment: occ. use    No Known Allergies Current Outpatient Prescriptions  Medication Sig Dispense Refill  . carvedilol (COREG) 6.25 MG tablet Take 1 tablet (6.25 mg total) by mouth 2 (two) times daily with a meal. 60 tablet 3  . ferrous sulfate 325 (65 FE) MG tablet Take 325 mg by mouth daily with breakfast.    . isosorbide-hydrALAZINE (BIDIL) 20-37.5 MG per tablet Take 1 tablet by mouth 3 (three) times daily. 90 tablet 3  . levothyroxine (SYNTHROID, LEVOTHROID) 75 MCG  tablet Take 75 mcg by mouth daily.    Marland Kitchen linagliptin (TRADJENTA) 5 MG TABS tablet Take 5 mg by mouth daily.    . niacin (NIASPAN) 1000 MG CR tablet Take 1,000 mg by mouth at bedtime.    . simvastatin (ZOCOR) 20 MG tablet Take 20 mg by mouth daily.    Marland Kitchen torsemide (DEMADEX) 20 MG tablet Take 20 mg by mouth daily.     . Vitamin D, Ergocalciferol, (DRISDOL) 50000 UNITS CAPS capsule Take 1 capsule (50,000 Units total) by mouth every 7 (seven) days. 30 capsule 3   No current facility-administered medications for this visit.   REVIEW OF SYSTEMS: Valu.Nieves ] denotes positive finding; [  ] denotes negative finding  CARDIOVASCULAR:  [ ]  chest pain   [ ]  chest pressure   [ ]  palpitations   [ ]  orthopnea   [ ]  dyspnea on exertion   [ ]  claudication   [ ]  rest pain   [ ]  DVT   [ ]  phlebitis PULMONARY:   [ ]  productive cough   [ ]  asthma   [ ]  wheezing NEUROLOGIC:   [ ]  weakness  [ ]  paresthesias  [ ]  aphasia  [ ]  amaurosis  [ ]  dizziness HEMATOLOGIC:   [ ]  bleeding problems   [ ]  clotting disorders MUSCULOSKELETAL:  [ ]  joint pain   [ ]  joint swelling [ ]  leg swelling GASTROINTESTINAL: [ ]   blood in stool  [ ]   hematemesis GENITOURINARY:  [ ]   dysuria  [ ]   hematuria PSYCHIATRIC:  [ ]  history of major depression INTEGUMENTARY:  [ ]  rashes  [ ]  ulcers CONSTITUTIONAL:  [ ]  fever   [ ]  chills  PHYSICAL EXAM: Filed Vitals:   03/28/15 1159  BP: 168/96  Pulse: 85  Height: 5\' 6"  (1.676 m)  Weight: 217 lb (98.431 kg)  SpO2: 97%   GENERAL: The patient is a well-nourished male, in no acute distress. The vital signs are documented above. CARDIOVASCULAR: There is a regular rate and rhythm. I do not detect carotid bruits. He has palpable radial and brachial pulses bilaterally. PULMONARY: There is good air exchange bilaterally without wheezing or rales. ABDOMEN: Soft and non-tender with normal pitched bowel sounds.  MUSCULOSKELETAL: There are no major deformities or cyanosis. NEUROLOGIC: No focal weakness or  paresthesias are detected. SKIN: There are no ulcers or rashes noted. PSYCHIATRIC: The patient has a normal affect.  DATA:  I have independently interpreted his upper extremity vein mapping. On the right side, the forearm and upper arm cephalic vein looked marginal in size. The basilic vein may potentially be usable. On the left side, the forearm and upper arm cephalic vein are marginal in size. The basilic vein is also marginal in size.  I have independently interpreted his upper extremity arterial Doppler study which shows triphasic radial and ulnar waveforms bilaterally.  MEDICAL ISSUES: STAGE IV CHRONIC KIDNEY DISEASE: His veins are marginal in size and he may not be a candidate for fistula. However I have recommended placement of a left AV fistula or AV graft. I explained the procedure to the patient and his appointed guardian. I have explained the indications for placement of an AV fistula or AV graft. I've explained that if at all possible we will place an AV fistula.  I have reviewed the risks of placement of an AV fistula including but not limited to: failure of the fistula to mature, need for subsequent interventions, and thrombosis. In addition I have reviewed the potential complications of placement of an AV graft. These risks include, but are not limited to, graft thrombosis, graft infection, wound healing problems, bleeding, arm swelling, and steal syndrome. All the patient's questions were answered and they are agreeable to proceed with surgery. His surgery is scheduled for 04/10/2015.   [name] [facility] of [place]: [phone]

## 2015-04-11 DIAGNOSIS — E1165 Type 2 diabetes mellitus with hyperglycemia: Secondary | ICD-10-CM | POA: Diagnosis not present

## 2015-04-11 DIAGNOSIS — F79 Unspecified intellectual disabilities: Secondary | ICD-10-CM | POA: Diagnosis not present

## 2015-04-11 DIAGNOSIS — N184 Chronic kidney disease, stage 4 (severe): Secondary | ICD-10-CM | POA: Diagnosis not present

## 2015-04-11 DIAGNOSIS — E039 Hypothyroidism, unspecified: Secondary | ICD-10-CM | POA: Diagnosis not present

## 2015-04-11 DIAGNOSIS — I10 Essential (primary) hypertension: Secondary | ICD-10-CM | POA: Diagnosis not present

## 2015-04-11 DIAGNOSIS — Z79899 Other long term (current) drug therapy: Secondary | ICD-10-CM | POA: Diagnosis not present

## 2015-04-12 ENCOUNTER — Other Ambulatory Visit (HOSPITAL_COMMUNITY): Payer: Self-pay | Admitting: Nephrology

## 2015-04-12 ENCOUNTER — Ambulatory Visit: Payer: Medicare Other | Admitting: Vascular Surgery

## 2015-04-12 ENCOUNTER — Other Ambulatory Visit (HOSPITAL_COMMUNITY): Payer: Medicare Other

## 2015-04-12 ENCOUNTER — Encounter (HOSPITAL_COMMUNITY): Payer: Medicare Other

## 2015-04-12 DIAGNOSIS — N1330 Unspecified hydronephrosis: Secondary | ICD-10-CM

## 2015-04-16 ENCOUNTER — Emergency Department (HOSPITAL_COMMUNITY): Payer: Medicare Other

## 2015-04-16 ENCOUNTER — Encounter (HOSPITAL_COMMUNITY): Payer: Self-pay | Admitting: *Deleted

## 2015-04-16 ENCOUNTER — Other Ambulatory Visit: Payer: Self-pay

## 2015-04-16 ENCOUNTER — Inpatient Hospital Stay (HOSPITAL_COMMUNITY)
Admission: EM | Admit: 2015-04-16 | Discharge: 2015-04-23 | DRG: 683 | Disposition: A | Discharge status: Nursing Home (Includes ICF) | Payer: Medicare Other | Attending: Internal Medicine | Admitting: Internal Medicine

## 2015-04-16 ENCOUNTER — Ambulatory Visit (HOSPITAL_COMMUNITY)
Admission: RE | Admit: 2015-04-16 | Discharge: 2015-04-16 | Disposition: A | Discharge status: Home / Self Care | Payer: Medicare Other | Source: Ambulatory Visit | Attending: Nephrology | Admitting: Nephrology

## 2015-04-16 DIAGNOSIS — N19 Unspecified kidney failure: Secondary | ICD-10-CM | POA: Diagnosis not present

## 2015-04-16 DIAGNOSIS — E559 Vitamin D deficiency, unspecified: Secondary | ICD-10-CM | POA: Diagnosis not present

## 2015-04-16 DIAGNOSIS — E870 Hyperosmolality and hypernatremia: Secondary | ICD-10-CM | POA: Diagnosis not present

## 2015-04-16 DIAGNOSIS — N139 Obstructive and reflux uropathy, unspecified: Secondary | ICD-10-CM | POA: Diagnosis not present

## 2015-04-16 DIAGNOSIS — E1122 Type 2 diabetes mellitus with diabetic chronic kidney disease: Secondary | ICD-10-CM | POA: Diagnosis present

## 2015-04-16 DIAGNOSIS — N1330 Unspecified hydronephrosis: Secondary | ICD-10-CM | POA: Diagnosis present

## 2015-04-16 DIAGNOSIS — R338 Other retention of urine: Secondary | ICD-10-CM | POA: Diagnosis present

## 2015-04-16 DIAGNOSIS — D649 Anemia, unspecified: Secondary | ICD-10-CM | POA: Diagnosis present

## 2015-04-16 DIAGNOSIS — N184 Chronic kidney disease, stage 4 (severe): Secondary | ICD-10-CM | POA: Diagnosis present

## 2015-04-16 DIAGNOSIS — R339 Retention of urine, unspecified: Secondary | ICD-10-CM | POA: Diagnosis not present

## 2015-04-16 DIAGNOSIS — I129 Hypertensive chronic kidney disease with stage 1 through stage 4 chronic kidney disease, or unspecified chronic kidney disease: Secondary | ICD-10-CM | POA: Diagnosis present

## 2015-04-16 DIAGNOSIS — N138 Other obstructive and reflux uropathy: Secondary | ICD-10-CM | POA: Diagnosis not present

## 2015-04-16 DIAGNOSIS — E039 Hypothyroidism, unspecified: Secondary | ICD-10-CM | POA: Diagnosis present

## 2015-04-16 DIAGNOSIS — F79 Unspecified intellectual disabilities: Secondary | ICD-10-CM | POA: Diagnosis present

## 2015-04-16 DIAGNOSIS — N320 Bladder-neck obstruction: Secondary | ICD-10-CM | POA: Diagnosis present

## 2015-04-16 DIAGNOSIS — N401 Enlarged prostate with lower urinary tract symptoms: Secondary | ICD-10-CM | POA: Diagnosis present

## 2015-04-16 DIAGNOSIS — N3289 Other specified disorders of bladder: Secondary | ICD-10-CM | POA: Diagnosis not present

## 2015-04-16 DIAGNOSIS — I10 Essential (primary) hypertension: Secondary | ICD-10-CM | POA: Diagnosis not present

## 2015-04-16 DIAGNOSIS — D638 Anemia in other chronic diseases classified elsewhere: Secondary | ICD-10-CM

## 2015-04-16 DIAGNOSIS — N179 Acute kidney failure, unspecified: Principal | ICD-10-CM | POA: Diagnosis present

## 2015-04-16 HISTORY — DX: Unspecified intellectual disabilities: F79

## 2015-04-16 LAB — BASIC METABOLIC PANEL
Anion gap: 9 (ref 5–15)
BUN: 75 mg/dL — ABNORMAL HIGH (ref 6–20)
CO2: 22 mmol/L (ref 22–32)
Calcium: 8.8 mg/dL — ABNORMAL LOW (ref 8.9–10.3)
Chloride: 109 mmol/L (ref 101–111)
Creatinine, Ser: 6.67 mg/dL — ABNORMAL HIGH (ref 0.61–1.24)
GFR calc Af Amer: 9 mL/min — ABNORMAL LOW (ref >60)
GFR calc non Af Amer: 8 mL/min — ABNORMAL LOW (ref >60)
Glucose, Bld: 92 mg/dL (ref 65–99)
Potassium: 4.9 mmol/L (ref 3.5–5.1)
Sodium: 140 mmol/L (ref 135–145)

## 2015-04-16 LAB — URINALYSIS, ROUTINE W REFLEX MICROSCOPIC
Bilirubin Urine: NEGATIVE
Glucose, UA: NEGATIVE mg/dL
Ketones, ur: NEGATIVE mg/dL
Leukocytes, UA: NEGATIVE
Nitrite: NEGATIVE
Protein, ur: 100 mg/dL — AB
Specific Gravity, Urine: 1.02 (ref 1.005–1.030)
Urobilinogen, UA: 0.2 mg/dL (ref 0.0–1.0)
pH: 5.5 (ref 5.0–8.0)

## 2015-04-16 LAB — CBC WITH DIFFERENTIAL/PLATELET
Basophils Absolute: 0 10*3/uL (ref 0.0–0.1)
Basophils Relative: 0 % (ref 0–1)
Eosinophils Absolute: 0.1 10*3/uL (ref 0.0–0.7)
Eosinophils Relative: 3 % (ref 0–5)
HCT: 21.6 % — ABNORMAL LOW (ref 39.0–52.0)
Hemoglobin: 7.1 g/dL — ABNORMAL LOW (ref 13.0–17.0)
Lymphocytes Relative: 31 % (ref 12–46)
Lymphs Abs: 1.4 10*3/uL (ref 0.7–4.0)
MCH: 30.5 pg (ref 26.0–34.0)
MCHC: 32.9 g/dL (ref 30.0–36.0)
MCV: 92.7 fL (ref 78.0–100.0)
Monocytes Absolute: 0.6 10*3/uL (ref 0.1–1.0)
Monocytes Relative: 13 % — ABNORMAL HIGH (ref 3–12)
Neutro Abs: 2.3 10*3/uL (ref 1.7–7.7)
Neutrophils Relative %: 53 % (ref 43–77)
Platelets: 125 10*3/uL — ABNORMAL LOW (ref 150–400)
RBC: 2.33 MIL/uL — ABNORMAL LOW (ref 4.22–5.81)
RDW: 19.6 % — ABNORMAL HIGH (ref 11.5–15.5)
WBC: 4.4 10*3/uL (ref 4.0–10.5)

## 2015-04-16 LAB — URINE MICROSCOPIC-ADD ON

## 2015-04-16 LAB — TROPONIN I: Troponin I: <0.03 ng/mL (ref <0.031)

## 2015-04-16 MED ORDER — CEFTRIAXONE SODIUM 1 G IJ SOLR
1.0000 g | Freq: Once | INTRAMUSCULAR | Status: AC
  Administered 2015-04-16: 1 g via INTRAVENOUS

## 2015-04-16 MED ORDER — LIDOCAINE HCL 2 % EX GEL
CUTANEOUS | Status: AC
  Administered 2015-04-16: 23:00:00
  Filled 2015-04-16: qty 10

## 2015-04-16 MED ORDER — DEXTROSE 5 % IV SOLN
INTRAVENOUS | Status: AC
  Filled 2015-04-16: qty 10

## 2015-04-16 NOTE — Progress Notes (Signed)
Preprocedure diagnosis: Acute on chronic renal failure, bilateral hydronephrosis, urinary retention Post procedure diagnosis: Bladder neck contracture (post-TURP)  Procedure: Foley catheter attempt, cystoscopy, urethral dilation attempt  Findings: On cystoscopy the urethra was normal. The prostatic urethra was patent, there was a dense bladder neck contracture down to about 5 [place] in size. I passed a sensor wire through it and tried to slide a metal dilator over the wire but it would not pass.  Description of procedure: After obtaining verbal consent from the patient and his sister he was prepped and draped in the usual sterile fashion. I attempted to pass a 20 [place] coud catheter and I could tell it was stopping in the bulbar urethra or prostate.  Therefore the cystoscope was passed per urethra through the membranous urethra and into the prosthetic urethra which was patent and curving up there was a dense, tight bladder neck contracture. I passed a sensor wire through it and remove the scope. Over the sensor wire up past a 12 French metal dilator which hit the stricture and it was obviously very hard and dense. I could not get the dilator to progress and I did not want to put excessive force on it. The dilator was removed and I repassed the cystoscope along the wire and I had not made any progress on dilation of the bladder neck contracture.  Therefore the scope was removed and the wire was removed.  He was covered with Rocephin 1 g. This would also cover him for a suprapubic tube tomorrow.

## 2015-04-16 NOTE — ED Notes (Signed)
Third nurse, Jeanice Lim, tried catheter attempt with a 18 french coude with no success. MD rancour made aware

## 2015-04-16 NOTE — ED Notes (Signed)
2 nurse attempt at placing catheter, no successful attempts. AC made aware and getting 18 French catheter.

## 2015-04-16 NOTE — ED Notes (Signed)
Sent from [facility] by Dr Legrand Rams after having test done here earlier.  ? [place]

## 2015-04-16 NOTE — Progress Notes (Signed)
Assessment: Obstructive uropathy from dense bladder neck contracture likely causing urinary retention, bilateral hydronephrosis and acute on chronic renal failure. Patient needs bladder drainage and this would likely improve his kidney function and delay his need for dialysis for some time.  Plan:  -I'll request CT-guided 16Fr suprapubic tube placement by IR -Watch for postobstructive diuresis after catheter placement -Patient will need follow-up with urology once stable/discharged to consider surgical/endoscopic management of bladder neck contracture

## 2015-04-16 NOTE — Consult Note (Addendum)
Consult: difficult foley, ARF, hydronephrosis, urinary retention Requested by: Dr. Wyvonnia Dusky  History of Present Illness: Pt with history of BPH, urinary retention, bilateral hydronephrosis and acute renal failure. CT in 2012 showed signs of BOO and a 70 g prostate but no hydronephrosis. U/S kidneys March 2015 showed bilateral hydroureteronephrosis down to a distended bladder with a large median lobe visible. His creatinine had climbed up into the 3 range. Therefore he was taken for TURP with Dr. Geraldo Pitter 01/04/2014. His creatinine decreased to around 1.7. He did not have another creatinine until around May 2016 when 80 climbed back up into the 3 range. Renal ultrasound again showed bilateral hydroureteronephrosis down to a distended bladder with a post void around 830 mL. Now his serum creatinine is up significantly to Cr 6.67. He underwent renal ultrasound today which showed severe bilateral hydroureteronephrosis down to a distended bladder contained about 1200 mL of urine. In the emergency department Foley catheter has been attempted to to 3 times without success and urology was consulted.  Patient without complaints. He is a poor historian. Per PCP notes patient really didn't follow-up for about one year.   Past Medical History  Diagnosis Date  . Hypertension   . Diabetes mellitus   . Smoking   . Abnormal CT scan, kidney 10/06/2011  . UTI (lower urinary tract infection) 10/06/2011  . Bladder wall thickening 10/06/2011  . Acute pyelonephritis 10/07/2011  . 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 Poor historian poor historian  . BPH (benign prostatic hypertrophy)   . Anxiety     mental retardation  . Anemia   . Hypothyroidism   . MR (mental retardation)    Past Surgical History  Procedure Laterality Date  . Cystoscopy with urethral dilatation N/A 12/29/2013    Procedure: CYSTOSCOPY WITH URETHRAL DILATATION;  Surgeon: Marissa Nestle, MD;  Location: [facility];  Service: Urology;  Laterality: N/A;  .  Transurethral resection of prostate N/A 01/04/2014    Procedure: TRANSURETHRAL RESECTION OF THE PROSTATE (TURP) (procedure #2);  Surgeon: Marissa Nestle, MD;  Location: [facility];  Service: Urology;  Laterality: N/A;  . Circumcision N/A 01/04/2014    Procedure: CIRCUMCISION ADULT (procedure #1);  Surgeon: Marissa Nestle, MD;  Location: [facility];  Service: Urology;  Laterality: N/A;    Home Medications:   (Not in a hospital admission) Allergies: No Known Allergies  Family History  Problem Relation Age of Onset  . Cancer Mother    Social History:  reports that he has never smoked. He has never used smokeless tobacco. He reports that he does not drink alcohol or use illicit drugs.  ROS: A complete review of systems was performed.  All systems are negative except for pertinent findings as noted. Review of Systems  All other systems reviewed and are negative.    Physical Exam:  Vital signs in last 24 hours: Temp:  [98.3 F (36.8 C)] 98.3 F (36.8 C) (06/20 1516) Pulse Rate:  [70-98] 98 (06/20 1830) Resp:  [14-17] 16 (06/20 1830) BP: (139-160)/(83-94) 160/94 mmHg (06/20 1830) SpO2:  [99 %-100 %] 99 % (06/20 1830) Weight:  [89.359 kg (197 lb)] 89.359 kg (197 lb) (06/20 1516) General:  Alert and oriented, No acute distress HEENT: Normocephalic, atraumatic Neck: No JVD or lymphadenopathy Cardiovascular: Regular rate and rhythm Lungs: Regular rate and effort Abdomen: Soft, nontender, nondistended, bladder palpable up to the umbilicus. Back: No CVA tenderness Extremities: No edema Neurologic: Grossly intact GU: Penis circumcised without mass or lesion, testicles descended bilaterally and  palpably normal. Digital rectal exam-prostate difficult to palpate given that patient was lying in bed but I did not palpate any significant prostate enlargement or mass.  Laboratory Data:  Results for orders placed or performed during the hospital encounter of 04/16/15 (from the past 24 hour(s))   CBC with Differential/Platelet     Status: Abnormal   Collection Time: 04/16/15  5:25 PM  Result Value Ref Range   WBC 4.4 4.0 - 10.5 K/uL   RBC 2.33 (L) 4.22 - 5.81 MIL/uL   Hemoglobin 7.1 (L) 13.0 - 17.0 g/dL   HCT 21.6 (L) 39.0 - 52.0 %   MCV 92.7 78.0 - 100.0 fL   MCH 30.5 26.0 - 34.0 pg   MCHC 32.9 30.0 - 36.0 g/dL   RDW 19.6 (H) 11.5 - 15.5 %   Platelets 125 (L) 150 - 400 K/uL   Neutrophils Relative % 53 43 - 77 %   Neutro Abs 2.3 1.7 - 7.7 K/uL   Lymphocytes Relative 31 12 - 46 %   Lymphs Abs 1.4 0.7 - 4.0 K/uL   Monocytes Relative 13 (H) 3 - 12 %   Monocytes Absolute 0.6 0.1 - 1.0 K/uL   Eosinophils Relative 3 0 - 5 %   Eosinophils Absolute 0.1 0.0 - 0.7 K/uL   Basophils Relative 0 0 - 1 %   Basophils Absolute 0.0 0.0 - 0.1 K/uL  Basic metabolic panel     Status: Abnormal   Collection Time: 04/16/15  5:25 PM  Result Value Ref Range   Sodium 140 135 - 145 mmol/L   Potassium 4.9 3.5 - 5.1 mmol/L   Chloride 109 101 - 111 mmol/L   CO2 22 22 - 32 mmol/L   Glucose, Bld 92 65 - 99 mg/dL   BUN 75 (H) 6 - 20 mg/dL   Creatinine, Ser 6.67 (H) 0.61 - 1.24 mg/dL   Calcium 8.8 (L) 8.9 - 10.3 mg/dL   GFR calc non Af Amer 8 (L) >60 mL/min   GFR calc Af Amer 9 (L) >60 mL/min   Anion gap 9 5 - 15  Troponin I     Status: None   Collection Time: 04/16/15  5:25 PM  Result Value Ref Range   Troponin I <0.03 <0.031 ng/mL   No results found for this or any previous visit (from the past 240 hour(s)). Creatinine:  Recent Labs  04/16/15 1725  CREATININE 6.67*    Impression/Assessment/plan:  acute on chronic renal failure, bilateral hydronephrosis likely from residual prostate tissue or urethral stricture disease status post TURP March 2015- I discussed with the patient and his sister the nature risks and benefits of Foley catheter placement as well as cystoscopy and possible urethral stricture dilation/placement of catheter over a wire and the elected to proceed. We discussed that  this was not successful he may need suprapubic tube placement and I would ask IR to do this under image guidance.  Leela Vanbrocklin 04/16/2015, 7:14 PM

## 2015-04-16 NOTE — ED Provider Notes (Signed)
CSN: [id number]     Arrival date & time 04/16/15  1504 History   First MD Initiated Contact with Patient 04/16/15 1659     No chief complaint on file.    (Consider location/radiation/quality/duration/timing/severity/associated sxs/prior Treatment) HPI Comments: Level V caveat for mental retardation. Patient referred by Dr. Legrand Rams after having a renal ultrasound done today. It showed severe hydronephrosis and distended bladder. Patient states he feels fine. Denies any difficulty with urination and states he is pain normally. Denies any abdominal pain, back pain, chest pain or shortness of breath. Patient admitted to the hospital last month with renal failure and was being prepared for dialysis. He was supposed to have a fistula placed but this did not occur because of hyperkalemia. History is limited as the caregiver and patient do not know his history. Patient has history of hypertension, diabetes, CK D and mental retardation. He denies any fever, chills, nausea or vomiting.  The history is provided by the patient and a caregiver. The history is limited by the condition of the patient.    Past Medical History  Diagnosis Date  . Hypertension   . Diabetes mellitus   . Smoking   . Abnormal CT scan, kidney 10/06/2011  . UTI (lower urinary tract infection) 10/06/2011  . Bladder wall thickening 10/06/2011  . Acute pyelonephritis 10/07/2011  . 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 Poor historian poor historian  . BPH (benign prostatic hypertrophy)   . Anxiety     mental retardation  . Anemia   . Hypothyroidism   . MR (mental retardation)    Past Surgical History  Procedure Laterality Date  . Cystoscopy with urethral dilatation N/A 12/29/2013    Procedure: CYSTOSCOPY WITH URETHRAL DILATATION;  Surgeon: Marissa Nestle, MD;  Location: [facility];  Service: Urology;  Laterality: N/A;  . Transurethral resection of prostate N/A 01/04/2014    Procedure: TRANSURETHRAL RESECTION OF THE PROSTATE (TURP)  (procedure #2);  Surgeon: Marissa Nestle, MD;  Location: [facility];  Service: Urology;  Laterality: N/A;  . Circumcision N/A 01/04/2014    Procedure: CIRCUMCISION ADULT (procedure #1);  Surgeon: Marissa Nestle, MD;  Location: [facility];  Service: Urology;  Laterality: N/A;   Family History  Problem Relation Age of Onset  . Cancer Mother    History  Substance Use Topics  . Smoking status: Never Smoker   . Smokeless tobacco: Never Used  . Alcohol Use: No     Comment: occ. use     Review of Systems  Constitutional: Negative for fever, activity change and appetite change.  Respiratory: Negative for cough, chest tightness and shortness of breath.   Cardiovascular: Positive for leg swelling. Negative for chest pain.  Gastrointestinal: Positive for abdominal pain. Negative for nausea and vomiting.  Genitourinary: Negative for dysuria, hematuria and testicular pain.  Musculoskeletal: Negative for myalgias, back pain and arthralgias.  Skin: Negative for wound.  Neurological: Negative for dizziness, weakness and headaches.  A complete 10 system review of systems was obtained and all systems are negative except as noted in the HPI and PMH.      Allergies  Review of patient's allergies indicates no known allergies.  Home Medications   Prior to Admission medications   Medication Sig Start Date End Date Taking? Authorizing Provider  carvedilol (COREG) 6.25 MG tablet Take 1 tablet (6.25 mg total) by mouth 2 (two) times daily with a meal. 03/08/15  Yes [name], MD  ferrous sulfate 325 (65 FE) MG tablet Take 325 mg  by mouth daily with breakfast.   Yes Historical Provider, MD  isosorbide-hydrALAZINE (BIDIL) 20-37.5 MG per tablet Take 1 tablet by mouth 3 (three) times daily. 03/08/15  Yes [name], MD  levothyroxine (SYNTHROID, LEVOTHROID) 75 MCG tablet Take 75 mcg by mouth daily before breakfast.    Yes Historical Provider, MD  linagliptin (TRADJENTA) 5 MG TABS tablet Take 5 mg by  mouth daily.   Yes Historical Provider, MD  niacin (NIASPAN) 1000 MG CR tablet Take 1,000 mg by mouth at bedtime.   Yes Historical Provider, MD  simvastatin (ZOCOR) 20 MG tablet Take 20 mg by mouth at bedtime.  02/28/15  Yes Historical Provider, MD  torsemide (DEMADEX) 20 MG tablet Take 20 mg by mouth daily.  03/20/15  Yes Historical Provider, MD  Vitamin D, Ergocalciferol, (DRISDOL) 50000 UNITS CAPS capsule Take 1 capsule (50,000 Units total) by mouth every 7 (seven) days. Patient taking differently: Take 50,000 Units by mouth every 7 (seven) days. On Mondays 03/08/15  Yes Tesfaye Fanta, MD   BP 165/91 mmHg  Pulse 94  Temp(Src) 98.4 F (36.9 C) (Oral)  Resp 15  Ht 5\' 5"  (1.651 m)  Wt 207 lb 3.7 oz (94 kg)  BMI 34.49 kg/m2  SpO2 98% Physical Exam  Constitutional: He is oriented to person, place, and time. He appears well-developed and well-nourished. No distress.  HENT:  Head: Normocephalic and atraumatic.  Mouth/Throat: Oropharynx is clear and moist. No oropharyngeal exudate.  Eyes: Conjunctivae and EOM are normal. Pupils are equal, round, and reactive to light.  Neck: Normal range of motion. Neck supple.  No meningismus.  Cardiovascular: Normal rate, regular rhythm, normal heart sounds and intact distal pulses.   No murmur heard. Pulmonary/Chest: Effort normal and breath sounds normal. No respiratory distress.  Abdominal: Soft. He exhibits distension. There is tenderness. There is no rebound and no guarding.  Distended abdomen.  Genitourinary:  No testicular pain or swelling  Musculoskeletal: Normal range of motion. He exhibits no edema or tenderness.  Neurological: He is alert and oriented to person, place, and time. No cranial nerve deficit. He exhibits normal muscle tone. Coordination normal.  No ataxia on finger to nose bilaterally. No pronator drift. 5/5 strength throughout. CN 2-12 intact. Negative Romberg. Equal grip strength. Sensation intact. Gait is normal.   Skin: Skin is  warm.  Psychiatric: He has a normal mood and affect. His behavior is normal.  Nursing note and vitals reviewed.   ED Course  Procedures (including critical care time) Labs Review Labs Reviewed  CBC WITH DIFFERENTIAL/PLATELET - Abnormal; Notable for the following:    RBC 2.33 (*)    Hemoglobin 7.1 (*)    HCT 21.6 (*)    RDW 19.6 (*)    Platelets 125 (*)    Monocytes Relative 13 (*)    All other components within normal limits  BASIC METABOLIC PANEL - Abnormal; Notable for the following:    BUN 75 (*)    Creatinine, Ser 6.67 (*)    Calcium 8.8 (*)    GFR calc non Af Amer 8 (*)    GFR calc Af Amer 9 (*)    All other components within normal limits  URINALYSIS, ROUTINE W REFLEX MICROSCOPIC (NOT AT [facility]) - Abnormal; Notable for the following:    Color, Urine RED (*)    APPearance HAZY (*)    Hgb urine dipstick LARGE (*)    Protein, ur 100 (*)    All other components within normal limits  URINE  MICROSCOPIC-ADD ON - Abnormal; Notable for the following:    Bacteria, UA FEW (*)    All other components within normal limits  MRSA PCR SCREENING  TROPONIN I  PROTIME-INR  SODIUM, URINE, RANDOM  CALCIUM / CREATININE RATIO, URINE  HEMOGLOBIN A1C  COMPREHENSIVE METABOLIC PANEL  CBC  CYTOLOGY - NON PAP  CYTOLOGY - NON PAP    Imaging Review Dg Chest 2 View  04/16/2015   CLINICAL DATA:  History of renal failure  EXAM: CHEST - 2 VIEW  COMPARISON:  12/25/2013  FINDINGS: The heart size and mediastinal contours are within normal limits. Both lungs are clear. The visualized skeletal structures are unremarkable.  IMPRESSION: No active disease.   Electronically Signed   By: Inez Catalina M.D.   On: 04/16/2015 18:04   US Renal  04/16/2015   CLINICAL DATA:  Bilateral hydronephrosis.  EXAM: RENAL / URINARY TRACT ULTRASOUND COMPLETE  COMPARISON:  03/02/2015  FINDINGS: Right Kidney:  Length: 13.3 cm. Severe hydronephrosis similar to comparison exam. Increased renal cortical echogenicity.  Left  Kidney:  Length: 12.7 cm. Severe hydronephrosis with increased cortical echogenicity. No significant change from prior.  Bladder:  The bladder is distended with the large which postvoid residual of 1120 cc disease.  IMPRESSION: 1. No change in bilateral severe hydronephrosis. 2. No change in distended bladder with large postvoid residual. 3.   Electronically Signed   By: Suzy Bouchard M.D.   On: 04/16/2015 09:37     EKG Interpretation   Date/Time:  Monday April 16 2015 17:10:22 EDT Ventricular Rate:  88 PR Interval:  192 QRS Duration: 89 QT Interval:  367 QTC Calculation: 444 R Axis:   7 Text Interpretation:  Sinus rhythm No significant change was found  Confirmed by Wyvonnia Dusky  MD, Lonzie Simmer [phone]) on 04/16/2015 5:13:20 PM      MDM   Final diagnoses:  Obstructive uropathy  Acute renal failure, unspecified acute renal failure type   patient presents with abnormal kidney ultrasound showing hydronephrosis and distended bladder. Concern for urinary retention. Foley catheter will be placed.  Nursing staff unable to place Foley catheter or coude catheter.  Creatinine has doubled to 6. Potassium normal.  Hemoglobin 7 from 8.6  Dr. Junious Silk of urology has performed cystoscope.  Patient has severe bladder neck obstruction.  Unable to place catheter.  He recommends suprapubic catheter placement by IR tomorrow.  D/w Dr. Maudie Mercury.     Ezequiel Essex, MD 04/17/15 [phone]

## 2015-04-16 NOTE — H&P (Signed)
Zachary [name] is an 69 y.o. male.    Dr. Legrand Rams (pcp)   Chief Complaint: hydronephrosis, arf HPI: 69 yo male with hx of Bph with bladder outlet obstruction , urinary retention, ARF, TURP (Dr. Michela Pitcher)  in the past apparently was seen in the office by pcp today and sent to ER for evaluation.  Pt was noted to have post void residual around 898mL and creatinine had increased to 6.67.  Pt had attempt in ER to insert foley catheter x3 without success.  Urology saw the patient and recommended suprapubic catheter placement by IR.  Pt will be admitted for ARF, Bph with bladder outlet obstruction, and anemia.   Past Medical History  Diagnosis Date  . Hypertension   . Diabetes mellitus   . Smoking   . Abnormal CT scan, kidney 10/06/2011  . UTI (lower urinary tract infection) 10/06/2011  . Bladder wall thickening 10/06/2011  . Acute pyelonephritis 10/07/2011  . 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 Poor historian poor historian  . BPH (benign prostatic hypertrophy)   . Anxiety     mental retardation  . Anemia   . Hypothyroidism   . MR (mental retardation)     Past Surgical History  Procedure Laterality Date  . Cystoscopy with urethral dilatation N/A 12/29/2013    Procedure: CYSTOSCOPY WITH URETHRAL DILATATION;  Surgeon: Marissa Nestle, MD;  Location: [facility];  Service: Urology;  Laterality: N/A;  . Transurethral resection of prostate N/A 01/04/2014    Procedure: TRANSURETHRAL RESECTION OF THE PROSTATE (TURP) (procedure #2);  Surgeon: Marissa Nestle, MD;  Location: [facility];  Service: Urology;  Laterality: N/A;  . Circumcision N/A 01/04/2014    Procedure: CIRCUMCISION ADULT (procedure #1);  Surgeon: Marissa Nestle, MD;  Location: [facility];  Service: Urology;  Laterality: N/A;    Family History  Problem Relation Age of Onset  . Cancer Mother    Social History:  reports that he has never smoked. He has never used smokeless tobacco. He reports that he does not drink alcohol or use illicit  drugs.  Allergies: No Known Allergies Medications reviewed   Results for orders placed or performed during the hospital encounter of 04/16/15 (from the past 48 hour(s))  CBC with Differential/Platelet     Status: Abnormal   Collection Time: 04/16/15  5:25 PM  Result Value Ref Range   WBC 4.4 4.0 - 10.5 K/uL   RBC 2.33 (L) 4.22 - 5.81 MIL/uL   Hemoglobin 7.1 (L) 13.0 - 17.0 g/dL   HCT 21.6 (L) 39.0 - 52.0 %   MCV 92.7 78.0 - 100.0 fL   MCH 30.5 26.0 - 34.0 pg   MCHC 32.9 30.0 - 36.0 g/dL   RDW 19.6 (H) 11.5 - 15.5 %   Platelets 125 (L) 150 - 400 K/uL   Neutrophils Relative % 53 43 - 77 %   Neutro Abs 2.3 1.7 - 7.7 K/uL   Lymphocytes Relative 31 12 - 46 %   Lymphs Abs 1.4 0.7 - 4.0 K/uL   Monocytes Relative 13 (H) 3 - 12 %   Monocytes Absolute 0.6 0.1 - 1.0 K/uL   Eosinophils Relative 3 0 - 5 %   Eosinophils Absolute 0.1 0.0 - 0.7 K/uL   Basophils Relative 0 0 - 1 %   Basophils Absolute 0.0 0.0 - 0.1 K/uL  Basic metabolic panel     Status: Abnormal   Collection Time: 04/16/15  5:25 PM  Result Value Ref Range   Sodium 140  135 - 145 mmol/L   Potassium 4.9 3.5 - 5.1 mmol/L   Chloride 109 101 - 111 mmol/L   CO2 22 22 - 32 mmol/L   Glucose, Bld 92 65 - 99 mg/dL   BUN 75 (H) 6 - 20 mg/dL   Creatinine, Ser 6.67 (H) 0.61 - 1.24 mg/dL   Calcium 8.8 (L) 8.9 - 10.3 mg/dL   GFR calc non Af Amer 8 (L) >60 mL/min   GFR calc Af Amer 9 (L) >60 mL/min    Comment: (NOTE) The eGFR has been calculated using the CKD EPI equation. This calculation has not been validated in all clinical situations. eGFR's persistently <60 mL/min signify possible Chronic Kidney Disease.    Anion gap 9 5 - 15  Troponin I     Status: None   Collection Time: 04/16/15  5:25 PM  Result Value Ref Range   Troponin I <0.03 <0.031 ng/mL    Comment:        NO INDICATION OF MYOCARDIAL INJURY.   Urinalysis, Routine w reflex microscopic (not at [facility])     Status: Abnormal   Collection Time: 04/16/15 11:18 PM   Result Value Ref Range   Color, Urine RED (A) YELLOW    Comment: BIOCHEMICALS MAY BE AFFECTED BY COLOR   APPearance HAZY (A) CLEAR   Specific Gravity, Urine 1.020 1.005 - 1.030   pH 5.5 5.0 - 8.0   Glucose, UA NEGATIVE NEGATIVE mg/dL   Hgb urine dipstick LARGE (A) NEGATIVE   Bilirubin Urine NEGATIVE NEGATIVE   Ketones, ur NEGATIVE NEGATIVE mg/dL   Protein, ur 100 (A) NEGATIVE mg/dL   Urobilinogen, UA 0.2 0.0 - 1.0 mg/dL   Nitrite NEGATIVE NEGATIVE   Leukocytes, UA NEGATIVE NEGATIVE  Urine microscopic-add on     Status: Abnormal   Collection Time: 04/16/15 11:18 PM  Result Value Ref Range   Squamous Epithelial / LPF RARE RARE   WBC, UA 0-2 <3 WBC/hpf   RBC / HPF TOO NUMEROUS TO COUNT <3 RBC/hpf   Bacteria, UA FEW (A) RARE   Dg Chest 2 View  04/16/2015   CLINICAL DATA:  History of renal failure  EXAM: CHEST - 2 VIEW  COMPARISON:  12/25/2013  FINDINGS: The heart size and mediastinal contours are within normal limits. Both lungs are clear. The visualized skeletal structures are unremarkable.  IMPRESSION: No active disease.   Electronically Signed   By: Inez Catalina M.D.   On: 04/16/2015 18:04   US Renal  04/16/2015   CLINICAL DATA:  Bilateral hydronephrosis.  EXAM: RENAL / URINARY TRACT ULTRASOUND COMPLETE  COMPARISON:  03/02/2015  FINDINGS: Right Kidney:  Length: 13.3 cm. Severe hydronephrosis similar to comparison exam. Increased renal cortical echogenicity.  Left Kidney:  Length: 12.7 cm. Severe hydronephrosis with increased cortical echogenicity. No significant change from prior.  Bladder:  The bladder is distended with the large which postvoid residual of 1120 cc disease.  IMPRESSION: 1. No change in bilateral severe hydronephrosis. 2. No change in distended bladder with large postvoid residual. 3.   Electronically Signed   By: Suzy Bouchard M.D.   On: 04/16/2015 09:37    Review of Systems  Constitutional: Negative.   HENT: Negative.   Eyes: Negative.   Respiratory: Negative.    Gastrointestinal: Negative.   Genitourinary: Positive for urgency and frequency. Negative for dysuria, hematuria and flank pain.  Musculoskeletal: Negative.   Skin: Negative.   Neurological: Negative.   Endo/Heme/Allergies: Negative.   Psychiatric/Behavioral: Negative.  Blood pressure 158/102, pulse 94, temperature 98.7 F (37.1 C), temperature source Oral, resp. rate 18, height _0  (1.676 m), weight 89.359 kg (197 lb), SpO2 99 %. Physical Exam  Constitutional: He is oriented to person, place, and time. He appears well-developed and well-nourished.  HENT:  Head: Normocephalic and atraumatic.  Mouth/Throat: No oropharyngeal exudate.  Eyes: Conjunctivae and EOM are normal. Pupils are equal, round, and reactive to light. No scleral icterus.  Neck: Normal range of motion. Neck supple. No JVD present. No tracheal deviation present. No thyromegaly present.  Cardiovascular: Normal rate and regular rhythm.  Exam reveals no gallop and no friction rub.   No murmur heard. Respiratory: Effort normal and breath sounds normal. No respiratory distress. He has no wheezes. He has no rales.  GI: Soft. Bowel sounds are normal. He exhibits no distension. There is no tenderness. There is no rebound and no guarding.  Musculoskeletal: Normal range of motion. He exhibits no edema or tenderness.  Lymphadenopathy:    He has no cervical adenopathy.  Neurological: He is alert and oriented to person, place, and time. He has normal reflexes. He displays normal reflexes. No cranial nerve deficit. He exhibits normal muscle tone. Coordination normal.  Skin: Skin is warm and dry. No rash noted. No erythema. No pallor.  Psychiatric: He has a normal mood and affect. His behavior is normal. Judgment and thought content normal.     Assessment/Plan Bph with outlet obstruction IR consutation to place suprapubic catheter Appreciate urology input  ARF Needs correction of bladder outlet obstruction Check ua, urine  sodium, urine creatinine, urine eosionphils.  Check cmp in am  Anemia Check cbc in am  Hypertension Hydralazine 72m iv q6h prn sbp >160  Dm2 fsbs q4h, iss   DVt prophylaxis: scd   KJani Gravel6/20/2016, 11:58 PM

## 2015-04-17 ENCOUNTER — Encounter (HOSPITAL_COMMUNITY): Payer: Self-pay | Admitting: *Deleted

## 2015-04-17 ENCOUNTER — Ambulatory Visit (HOSPITAL_COMMUNITY)
Admit: 2015-04-17 | Discharge: 2015-04-17 | Disposition: A | Discharge status: Home / Self Care | Payer: Medicare Other | Attending: Internal Medicine | Admitting: Internal Medicine

## 2015-04-17 DIAGNOSIS — N138 Other obstructive and reflux uropathy: Secondary | ICD-10-CM | POA: Insufficient documentation

## 2015-04-17 DIAGNOSIS — Z79899 Other long term (current) drug therapy: Secondary | ICD-10-CM

## 2015-04-17 DIAGNOSIS — F79 Unspecified intellectual disabilities: Secondary | ICD-10-CM | POA: Insufficient documentation

## 2015-04-17 DIAGNOSIS — I10 Essential (primary) hypertension: Secondary | ICD-10-CM | POA: Diagnosis not present

## 2015-04-17 DIAGNOSIS — E039 Hypothyroidism, unspecified: Secondary | ICD-10-CM | POA: Insufficient documentation

## 2015-04-17 DIAGNOSIS — N139 Obstructive and reflux uropathy, unspecified: Secondary | ICD-10-CM | POA: Diagnosis not present

## 2015-04-17 DIAGNOSIS — R339 Retention of urine, unspecified: Secondary | ICD-10-CM | POA: Insufficient documentation

## 2015-04-17 DIAGNOSIS — N184 Chronic kidney disease, stage 4 (severe): Secondary | ICD-10-CM | POA: Diagnosis not present

## 2015-04-17 DIAGNOSIS — N1330 Unspecified hydronephrosis: Secondary | ICD-10-CM | POA: Insufficient documentation

## 2015-04-17 DIAGNOSIS — N179 Acute kidney failure, unspecified: Secondary | ICD-10-CM

## 2015-04-17 DIAGNOSIS — E875 Hyperkalemia: Secondary | ICD-10-CM | POA: Diagnosis not present

## 2015-04-17 DIAGNOSIS — F419 Anxiety disorder, unspecified: Secondary | ICD-10-CM

## 2015-04-17 DIAGNOSIS — N401 Enlarged prostate with lower urinary tract symptoms: Secondary | ICD-10-CM | POA: Insufficient documentation

## 2015-04-17 DIAGNOSIS — R319 Hematuria, unspecified: Secondary | ICD-10-CM | POA: Diagnosis not present

## 2015-04-17 DIAGNOSIS — R338 Other retention of urine: Secondary | ICD-10-CM | POA: Insufficient documentation

## 2015-04-17 DIAGNOSIS — E559 Vitamin D deficiency, unspecified: Secondary | ICD-10-CM | POA: Diagnosis present

## 2015-04-17 DIAGNOSIS — E1122 Type 2 diabetes mellitus with diabetic chronic kidney disease: Secondary | ICD-10-CM | POA: Insufficient documentation

## 2015-04-17 DIAGNOSIS — E1165 Type 2 diabetes mellitus with hyperglycemia: Secondary | ICD-10-CM | POA: Diagnosis not present

## 2015-04-17 DIAGNOSIS — E870 Hyperosmolality and hypernatremia: Secondary | ICD-10-CM | POA: Diagnosis present

## 2015-04-17 DIAGNOSIS — N320 Bladder-neck obstruction: Secondary | ICD-10-CM | POA: Insufficient documentation

## 2015-04-17 DIAGNOSIS — N189 Chronic kidney disease, unspecified: Secondary | ICD-10-CM

## 2015-04-17 DIAGNOSIS — D649 Anemia, unspecified: Secondary | ICD-10-CM | POA: Diagnosis present

## 2015-04-17 DIAGNOSIS — I129 Hypertensive chronic kidney disease with stage 1 through stage 4 chronic kidney disease, or unspecified chronic kidney disease: Secondary | ICD-10-CM | POA: Insufficient documentation

## 2015-04-17 DIAGNOSIS — F17200 Nicotine dependence, unspecified, uncomplicated: Secondary | ICD-10-CM | POA: Insufficient documentation

## 2015-04-17 DIAGNOSIS — N390 Urinary tract infection, site not specified: Secondary | ICD-10-CM | POA: Diagnosis not present

## 2015-04-17 LAB — COMPREHENSIVE METABOLIC PANEL
ALT: 16 U/L — ABNORMAL LOW (ref 17–63)
AST: 14 U/L — ABNORMAL LOW (ref 15–41)
Albumin: 3.5 g/dL (ref 3.5–5.0)
Alkaline Phosphatase: 36 U/L — ABNORMAL LOW (ref 38–126)
Anion gap: 10 (ref 5–15)
BUN: 77 mg/dL — ABNORMAL HIGH (ref 6–20)
CO2: 19 mmol/L — ABNORMAL LOW (ref 22–32)
Calcium: 8.6 mg/dL — ABNORMAL LOW (ref 8.9–10.3)
Chloride: 112 mmol/L — ABNORMAL HIGH (ref 101–111)
Creatinine, Ser: 6.87 mg/dL — ABNORMAL HIGH (ref 0.61–1.24)
GFR calc Af Amer: 9 mL/min — ABNORMAL LOW (ref >60)
GFR calc non Af Amer: 7 mL/min — ABNORMAL LOW (ref >60)
Glucose, Bld: 78 mg/dL (ref 65–99)
Potassium: 4.7 mmol/L (ref 3.5–5.1)
Sodium: 141 mmol/L (ref 135–145)
Total Bilirubin: 0.5 mg/dL (ref 0.3–1.2)
Total Protein: 6.4 g/dL — ABNORMAL LOW (ref 6.5–8.1)

## 2015-04-17 LAB — PREPARE RBC (CROSSMATCH)

## 2015-04-17 LAB — GLUCOSE, CAPILLARY
Glucose-Capillary: 77 mg/dL (ref 65–99)
Glucose-Capillary: 82 mg/dL (ref 65–99)
Glucose-Capillary: 77 mg/dL (ref 65–99)
Glucose-Capillary: 141 mg/dL — ABNORMAL HIGH (ref 65–99)
Glucose-Capillary: 82 mg/dL (ref 65–99)

## 2015-04-17 LAB — CBC
HCT: 20.3 % — ABNORMAL LOW (ref 39.0–52.0)
Hemoglobin: 6.6 g/dL — CL (ref 13.0–17.0)
MCH: 30.1 pg (ref 26.0–34.0)
MCHC: 32.5 g/dL (ref 30.0–36.0)
MCV: 92.7 fL (ref 78.0–100.0)
Platelets: 106 10*3/uL — ABNORMAL LOW (ref 150–400)
RBC: 2.19 MIL/uL — ABNORMAL LOW (ref 4.22–5.81)
RDW: 19.8 % — ABNORMAL HIGH (ref 11.5–15.5)
WBC: 4.6 10*3/uL (ref 4.0–10.5)

## 2015-04-17 LAB — PROTIME-INR
INR: 1.17 (ref 0.00–1.49)
Prothrombin Time: 15.1 seconds (ref 11.6–15.2)

## 2015-04-17 LAB — SODIUM, URINE, RANDOM: Sodium, Ur: 60 mmol/L

## 2015-04-17 LAB — MRSA PCR SCREENING: MRSA by PCR: NEGATIVE

## 2015-04-17 MED ORDER — HYDRALAZINE HCL 20 MG/ML IJ SOLN
10.0000 mg | Freq: Four times a day (QID) | INTRAMUSCULAR | Status: DC | PRN
  Administered 2015-04-17: 10 mg via INTRAVENOUS
  Filled 2015-04-17: qty 1

## 2015-04-17 MED ORDER — SIMVASTATIN 20 MG PO TABS
20.0000 mg | ORAL_TABLET | Freq: Every day | ORAL | Status: DC
  Administered 2015-04-17 – 2015-04-22 (×7): 20 mg via ORAL
  Filled 2015-04-17 (×7): qty 1

## 2015-04-17 MED ORDER — INSULIN ASPART 100 UNIT/ML ~~LOC~~ SOLN
0.0000 [IU] | Freq: Three times a day (TID) | SUBCUTANEOUS | Status: DC
  Administered 2015-04-17 – 2015-04-18 (×3): 1 [IU] via SUBCUTANEOUS
  Administered 2015-04-19: 2 [IU] via SUBCUTANEOUS
  Administered 2015-04-19 – 2015-04-22 (×4): 1 [IU] via SUBCUTANEOUS
  Administered 2015-04-22: 2 [IU] via SUBCUTANEOUS

## 2015-04-17 MED ORDER — FENTANYL CITRATE (PF) 100 MCG/2ML IJ SOLN
INTRAMUSCULAR | Status: AC
  Filled 2015-04-17: qty 2

## 2015-04-17 MED ORDER — VITAMIN D (ERGOCALCIFEROL) 1.25 MG (50000 UNIT) PO CAPS
50000.0000 [IU] | ORAL_CAPSULE | ORAL | Status: DC
  Administered 2015-04-23: 50000 [IU] via ORAL
  Filled 2015-04-17: qty 1

## 2015-04-17 MED ORDER — ISOSORB DINITRATE-HYDRALAZINE 20-37.5 MG PO TABS
1.0000 | ORAL_TABLET | Freq: Three times a day (TID) | ORAL | Status: DC
  Administered 2015-04-17 – 2015-04-23 (×19): 1 via ORAL
  Filled 2015-04-17 (×25): qty 1

## 2015-04-17 MED ORDER — LEVOTHYROXINE SODIUM 75 MCG PO TABS
75.0000 ug | ORAL_TABLET | Freq: Every day | ORAL | Status: DC
  Administered 2015-04-17 – 2015-04-23 (×7): 75 ug via ORAL
  Filled 2015-04-17 (×7): qty 1

## 2015-04-17 MED ORDER — MIDAZOLAM HCL 2 MG/2ML IJ SOLN
INTRAMUSCULAR | Status: DC
Start: 2015-04-17 — End: 2015-04-18
  Filled 2015-04-17: qty 2

## 2015-04-17 MED ORDER — SODIUM CHLORIDE 0.9 % IJ SOLN
3.0000 mL | Freq: Two times a day (BID) | INTRAMUSCULAR | Status: DC
  Administered 2015-04-18 – 2015-04-19 (×3): 3 mL via INTRAVENOUS

## 2015-04-17 MED ORDER — ACETAMINOPHEN 650 MG RE SUPP: 650.0000 mg | Freq: Four times a day (QID) | RECTAL | Status: DC | PRN

## 2015-04-17 MED ORDER — SODIUM CHLORIDE 0.9 % IV SOLN
INTRAVENOUS | Status: AC
  Administered 2015-04-17: 02:00:00 via INTRAVENOUS

## 2015-04-17 MED ORDER — CARVEDILOL 3.125 MG PO TABS
6.2500 mg | ORAL_TABLET | Freq: Two times a day (BID) | ORAL | Status: DC
  Administered 2015-04-17 – 2015-04-23 (×13): 6.25 mg via ORAL
  Filled 2015-04-17 (×13): qty 2

## 2015-04-17 MED ORDER — FERROUS SULFATE 325 (65 FE) MG PO TABS
325.0000 mg | ORAL_TABLET | Freq: Every day | ORAL | Status: DC
  Administered 2015-04-17 – 2015-04-21 (×5): 325 mg via ORAL
  Filled 2015-04-17 (×5): qty 1

## 2015-04-17 MED ORDER — ACETAMINOPHEN 325 MG PO TABS: 650.0000 mg | ORAL_TABLET | Freq: Four times a day (QID) | ORAL | Status: DC | PRN

## 2015-04-17 MED ORDER — NIACIN ER (ANTIHYPERLIPIDEMIC) 500 MG PO TBCR
1000.0000 mg | EXTENDED_RELEASE_TABLET | Freq: Every day | ORAL | Status: DC
  Administered 2015-04-17 – 2015-04-22 (×6): 1000 mg via ORAL
  Filled 2015-04-17 (×8): qty 2

## 2015-04-17 MED ORDER — MIDAZOLAM HCL 2 MG/2ML IJ SOLN
INTRAMUSCULAR | Status: AC | PRN
  Administered 2015-04-17 (×2): 1 mg via INTRAVENOUS

## 2015-04-17 MED ORDER — SODIUM CHLORIDE 0.9 % IV SOLN
Freq: Once | INTRAVENOUS | Status: AC
  Administered 2015-04-17: 16:00:00 via INTRAVENOUS

## 2015-04-17 MED ORDER — FENTANYL CITRATE (PF) 100 MCG/2ML IJ SOLN
INTRAMUSCULAR | Status: AC | PRN
  Administered 2015-04-17: 50 ug via INTRAVENOUS

## 2015-04-17 MED ORDER — SODIUM CHLORIDE 0.9 % IV SOLN: Freq: Once | INTRAVENOUS | Status: AC

## 2015-04-17 MED ORDER — LIDOCAINE HCL 1 % IJ SOLN
INTRAMUSCULAR | Status: DC
Start: 2015-04-17 — End: 2015-04-18
  Filled 2015-04-17: qty 20

## 2015-04-17 NOTE — Procedures (Signed)
SUCCESSFUL US guided suprapubic cath insertion No comp Stable Full report in PACS

## 2015-04-17 NOTE — Care Management Note (Signed)
Case Management Note  Patient Details  Name: Zachary [name] MRN: [id number] Date of Birth: May 15, 1946   Expected Discharge Date:                  Expected Discharge Plan:  Assisted Living / Rest Home  In-House Referral:  Clinical Social Work  Discharge planning Services  CM Consult  Post Acute Care Choice:  NA Choice offered to:  NA  DME Arranged:    DME Agency:     HH Arranged:    [place] Agency:     Status of Service:  Completed, signed off  Medicare Important Message Given:    Date Medicare IM Given:    Medicare IM give by:    Date Additional Medicare IM Given:    Additional Medicare Important Message give by:     If discussed at [place] of Stay Meetings, dates discussed:    Additional Comments: Pt is from [facility]. Pt admitted with ARF. Pt uses a walker and requires assistance with ADL's. Pt plans to return to ALF at discharge. CSW is aware and will arrange for return to facility. No CM needs anticipated. Will cont to follow.  Sherald Barge, RN 04/17/2015, 3:11 PM

## 2015-04-17 NOTE — Progress Notes (Signed)
Patient ID: Zachary [name], male   DOB: Sep 26, 1946, 69 y.o.   MRN: [id number]    Pt with Hx BPH Urinary retention TURP 3/15 Noted increase Cr 02/2015 [place] Bladder neck contracture per Dr Junious Silk- unsuccessful foley placement  req for suprapubic catheter placement  Pt scheduled to be at [place] via ambulance noon today RN aware She is gaining consent from state department--pt is ward of state

## 2015-04-17 NOTE — Progress Notes (Addendum)
CRITICAL VALUE ALERT  Critical value received:  hbg 6.6  Date of notification:  04/17/2015  Time of notification:  0610  Critical value read back:yes  Nurse who received alert:  j Izaac Reisig RN  MD notified (1st page):  Georges Mouse  Time of first page:  0612  MD notified (2nd page):J. Maudie Mercury  Time of second page: 0630  Responding MD:  Georges Mouse  Time MD responded: [phone]

## 2015-04-17 NOTE — Progress Notes (Signed)
Subjective: Patient yesterday due to bladder out let obstruction and acute on chronic renal failure. Attempt to put foley catheter in ER failed. He is planned for suprapubic catheter placement today.  Objective: Vital signs in last 24 hours: Temp:  [98.3 F (36.8 C)-98.7 F (37.1 C)] 98.3 F (36.8 C) (06/21 0747) Pulse Rate:  [70-108] 104 (06/21 0700) Resp:  [9-20] 14 (06/21 0700) BP: (139-171)/(83-106) 144/96 mmHg (06/21 0700) SpO2:  [87 %-100 %] 99 % (06/21 0700) Weight:  [89.359 kg (197 lb)-95.7 kg (210 lb 15.7 oz)] 95.7 kg (210 lb 15.7 oz) (06/21 0500) Weight change:  Last BM Date: 04/16/15  Intake/Output from previous day: 06/20 0701 - 06/21 0700 In: -  Out: 375 [Urine:375]  PHYSICAL EXAM General appearance: alert and no distress Resp: clear to auscultation bilaterally Cardio: S1, S2 normal GI: soft, non-tender; bowel sounds normal; no masses,  no organomegaly Extremities: 2++ leg edema  Lab Results:  Results for orders placed or performed during the hospital encounter of 04/16/15 (from the past 48 hour(s))  Protime-INR     Status: None   Collection Time: 04/16/15  5:20 PM  Result Value Ref Range   Prothrombin Time 15.1 11.6 - 15.2 seconds   INR 1.17 0.00 - 1.49  CBC with Differential/Platelet     Status: Abnormal   Collection Time: 04/16/15  5:25 PM  Result Value Ref Range   WBC 4.4 4.0 - 10.5 K/uL   RBC 2.33 (L) 4.22 - 5.81 MIL/uL   Hemoglobin 7.1 (L) 13.0 - 17.0 g/dL   HCT 21.6 (L) 39.0 - 52.0 %   MCV 92.7 78.0 - 100.0 fL   MCH 30.5 26.0 - 34.0 pg   MCHC 32.9 30.0 - 36.0 g/dL   RDW 19.6 (H) 11.5 - 15.5 %   Platelets 125 (L) 150 - 400 K/uL   Neutrophils Relative % 53 43 - 77 %   Neutro Abs 2.3 1.7 - 7.7 K/uL   Lymphocytes Relative 31 12 - 46 %   Lymphs Abs 1.4 0.7 - 4.0 K/uL   Monocytes Relative 13 (H) 3 - 12 %   Monocytes Absolute 0.6 0.1 - 1.0 K/uL   Eosinophils Relative 3 0 - 5 %   Eosinophils Absolute 0.1 0.0 - 0.7 K/uL   Basophils Relative 0 0 - 1  %   Basophils Absolute 0.0 0.0 - 0.1 K/uL  Basic metabolic panel     Status: Abnormal   Collection Time: 04/16/15  5:25 PM  Result Value Ref Range   Sodium 140 135 - 145 mmol/L   Potassium 4.9 3.5 - 5.1 mmol/L   Chloride 109 101 - 111 mmol/L   CO2 22 22 - 32 mmol/L   Glucose, Bld 92 65 - 99 mg/dL   BUN 75 (H) 6 - 20 mg/dL   Creatinine, Ser 6.67 (H) 0.61 - 1.24 mg/dL   Calcium 8.8 (L) 8.9 - 10.3 mg/dL   GFR calc non Af Amer 8 (L) >60 mL/min   GFR calc Af Amer 9 (L) >60 mL/min    Comment: (NOTE) The eGFR has been calculated using the CKD EPI equation. This calculation has not been validated in all clinical situations. eGFR's persistently <60 mL/min signify possible Chronic Kidney Disease.    Anion gap 9 5 - 15  Troponin I     Status: None   Collection Time: 04/16/15  5:25 PM  Result Value Ref Range   Troponin I <0.03 <0.031 ng/mL    Comment:  NO INDICATION OF MYOCARDIAL INJURY.   Urinalysis, Routine w reflex microscopic (not at [facility])     Status: Abnormal   Collection Time: 04/16/15 11:18 PM  Result Value Ref Range   Color, Urine RED (A) YELLOW    Comment: BIOCHEMICALS MAY BE AFFECTED BY COLOR   APPearance HAZY (A) CLEAR   Specific Gravity, Urine 1.020 1.005 - 1.030   pH 5.5 5.0 - 8.0   Glucose, UA NEGATIVE NEGATIVE mg/dL   Hgb urine dipstick LARGE (A) NEGATIVE   Bilirubin Urine NEGATIVE NEGATIVE   Ketones, ur NEGATIVE NEGATIVE mg/dL   Protein, ur 100 (A) NEGATIVE mg/dL   Urobilinogen, UA 0.2 0.0 - 1.0 mg/dL   Nitrite NEGATIVE NEGATIVE   Leukocytes, UA NEGATIVE NEGATIVE  Urine microscopic-add on     Status: Abnormal   Collection Time: 04/16/15 11:18 PM  Result Value Ref Range   Squamous Epithelial / LPF RARE RARE   WBC, UA 0-2 <3 WBC/hpf   RBC / HPF TOO NUMEROUS TO COUNT <3 RBC/hpf   Bacteria, UA FEW (A) RARE  MRSA PCR Screening     Status: None   Collection Time: 04/17/15  1:42 AM  Result Value Ref Range   MRSA by PCR NEGATIVE NEGATIVE    Comment:         The GeneXpert MRSA Assay (FDA approved for NASAL specimens only), is one component of a comprehensive MRSA colonization surveillance program. It is not intended to diagnose MRSA infection nor to guide or monitor treatment for MRSA infections.   Glucose, capillary     Status: None   Collection Time: 04/17/15  4:08 AM  Result Value Ref Range   Glucose-Capillary 77 65 - 99 mg/dL   Comment 1 Notify RN   Comprehensive metabolic panel     Status: Abnormal   Collection Time: 04/17/15  4:36 AM  Result Value Ref Range   Sodium 141 135 - 145 mmol/L   Potassium 4.7 3.5 - 5.1 mmol/L   Chloride 112 (H) 101 - 111 mmol/L   CO2 19 (L) 22 - 32 mmol/L   Glucose, Bld 78 65 - 99 mg/dL   BUN 77 (H) 6 - 20 mg/dL   Creatinine, Ser 6.87 (H) 0.61 - 1.24 mg/dL   Calcium 8.6 (L) 8.9 - 10.3 mg/dL   Total Protein 6.4 (L) 6.5 - 8.1 g/dL   Albumin 3.5 3.5 - 5.0 g/dL   AST 14 (L) 15 - 41 U/L   ALT 16 (L) 17 - 63 U/L   Alkaline Phosphatase 36 (L) 38 - 126 U/L   Total Bilirubin 0.5 0.3 - 1.2 mg/dL   GFR calc non Af Amer 7 (L) >60 mL/min   GFR calc Af Amer 9 (L) >60 mL/min    Comment: (NOTE) The eGFR has been calculated using the CKD EPI equation. This calculation has not been validated in all clinical situations. eGFR's persistently <60 mL/min signify possible Chronic Kidney Disease.    Anion gap 10 5 - 15  CBC     Status: Abnormal   Collection Time: 04/17/15  4:36 AM  Result Value Ref Range   WBC 4.6 4.0 - 10.5 K/uL   RBC 2.19 (L) 4.22 - 5.81 MIL/uL   Hemoglobin 6.6 (LL) 13.0 - 17.0 g/dL    Comment: RESULT REPEATED AND VERIFIED CRITICAL RESULT CALLED TO, READ BACK BY AND VERIFIED WITH: JESSICA HEARN RN ON [id number] AT 0605 BY RESSEGGER R    HCT 20.3 (L) 39.0 - 52.0 %  MCV 92.7 78.0 - 100.0 fL   MCH 30.1 26.0 - 34.0 pg   MCHC 32.5 30.0 - 36.0 g/dL   RDW 19.8 (H) 11.5 - 15.5 %   Platelets 106 (L) 150 - 400 K/uL  Glucose, capillary     Status: None   Collection Time: 04/17/15  6:13 AM  Result  Value Ref Range   Glucose-Capillary 82 65 - 99 mg/dL   Comment 1 Notify RN   Glucose, capillary     Status: None   Collection Time: 04/17/15  7:25 AM  Result Value Ref Range   Glucose-Capillary 77 65 - 99 mg/dL   Comment 1 Notify RN    Comment 2 Document in Chart     ABGS No results for input(s): PHART, PO2ART, TCO2, HCO3 in the last 72 hours.  Invalid input(s): PCO2 CULTURES Recent Results (from the past 240 hour(s))  MRSA PCR Screening     Status: None   Collection Time: 04/17/15  1:42 AM  Result Value Ref Range Status   MRSA by PCR NEGATIVE NEGATIVE Final    Comment:        The GeneXpert MRSA Assay (FDA approved for NASAL specimens only), is one component of a comprehensive MRSA colonization surveillance program. It is not intended to diagnose MRSA infection nor to guide or monitor treatment for MRSA infections.    Studies/Results: Dg Chest 2 View  04/16/2015   CLINICAL DATA:  History of renal failure  EXAM: CHEST - 2 VIEW  COMPARISON:  12/25/2013  FINDINGS: The heart size and mediastinal contours are within normal limits. Both lungs are clear. The visualized skeletal structures are unremarkable.  IMPRESSION: No active disease.   Electronically Signed   By: Inez Catalina M.D.   On: 04/16/2015 18:04   US Renal  04/16/2015   CLINICAL DATA:  Bilateral hydronephrosis.  EXAM: RENAL / URINARY TRACT ULTRASOUND COMPLETE  COMPARISON:  03/02/2015  FINDINGS: Right Kidney:  Length: 13.3 cm. Severe hydronephrosis similar to comparison exam. Increased renal cortical echogenicity.  Left Kidney:  Length: 12.7 cm. Severe hydronephrosis with increased cortical echogenicity. No significant change from prior.  Bladder:  The bladder is distended with the large which postvoid residual of 1120 cc disease.  IMPRESSION: 1. No change in bilateral severe hydronephrosis. 2. No change in distended bladder with large postvoid residual. 3.   Electronically Signed   By: Suzy Bouchard M.D.   On: 04/16/2015  09:37    Medications: I have reviewed the patient's current medications.  Assesment:   Active Problems:   Acute renal failure   BPH (benign prostatic hypertrophy) with urinary retention   Anemia   Obstructive uropathy   ARF (acute renal failure)    Plan:  Medications reviewed Urology consult appreciated Suprapubic catheter placement as planned Nephrology consult Will monitor CBC/BMP    LOS: 0 days   Sholonda Jobst 04/17/2015, 8:23 AM

## 2015-04-17 NOTE — Clinical Social Work Note (Signed)
Clinical Social Work Assessment  Patient Details  Name: Zachary [name] MRN: [id number] Date of Birth: 10-19-1946  Date of referral:  04/17/15               Reason for consult:  Facility Placement                Permission sought to share information with:    Permission granted to share information::     Name::        Agency::     Relationship::     Contact Information:     Housing/Transportation Living arrangements for the past 2 months:  [place] of Information:  Patient, Other (Comment Required) (Brother, Alben Deeds.) Patient Interpreter Needed:  None Criminal Activity/Legal Involvement Pertinent to Current Situation/Hospitalization:  No - Comment as needed Significant Relationships:  Siblings Lives with:  Facility Resident Do you feel safe going back to the place where you live?  Yes Need for family participation in patient care:  Yes (Comment)  Care giving concerns:  Facility resident   Social Worker assessment / plan:  CSW met with patient and brother, Jantz Main, who was at bedside.  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  He stated that prior to that he lived with his daughter.  Patient stated that he ambulates with a walker at  [facility] and that facility staff assist him with ADL's. Patient stated "I guess I'm gone have to" when discussing his return to the facility upon discharge.  Patient's brother advised that patient would need to return to the facility upon discharge.  Patient stated that his family visits him often.  CSW spoke with Levada Dy at [facility].  She confirmed patient's statements.  She advised that patient can return to the facility upon discharge.      Employment status:  Disabled (Comment on whether or not currently receiving Disability) Insurance information:  Medicaid In [place], [place] PT Recommendations:    Information / Referral to community resources:     Patient/Family's Response  to care:  While patient would rather live on his own, he is agreeable to return to [facility].   Patient/Family's Understanding of and Emotional Response to Diagnosis, Current Treatment, and Prognosis:  Patient understands his diagnosis, treatment and prognosis.   Emotional Assessment Appearance:  Developmentally appropriate Attitude/Demeanor/Rapport:   (Cooperative) Affect (typically observed):  Calm Orientation:  Oriented to Self, Oriented to Place, Oriented to Situation Alcohol / Substance use:  Not Applicable Psych involvement (Current and /or in the community):  No (Comment)  Discharge Needs  Concerns to be addressed:  Discharge Planning Concerns Readmission within the last 30 days:  Yes Current discharge risk:  None Barriers to Discharge:  No Barriers Identified   Ihor Gully, LCSW 04/17/2015, 11:07 AM  [phone]

## 2015-04-17 NOTE — Consult Note (Signed)
Reason for Consult: Acute kidney injury superimposed on chronic Referring Physician: [name] is an 69 y.o. male.  HPI: He is a patient with history of diabetes, hypertension, chronic renal failure stage IV presently went for a vascular access placement and patient was found to have significantly elevated BUN and creatinine and hyperkalemia hence his surgery was canceled. Because of his history of BPH and obstructive uropathy ultrasound was done and patient was found to have obstructive uropathy. Presently her attempt for Foley catheter was unsuccessful hence patient will be sent to [facility] to have suprapubic catheter placement. Patient presently denies any nausea or vomiting. His appetite is good and no difficulty in breathing.  Past Medical History  Diagnosis Date  . Hypertension   . Diabetes mellitus   . Smoking   . Abnormal CT scan, kidney 10/06/2011  . UTI (lower urinary tract infection) 10/06/2011  . Bladder wall thickening 10/06/2011  . Acute pyelonephritis 10/07/2011  . 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 Poor historian poor historian  . BPH (benign prostatic hypertrophy)   . Anxiety     mental retardation  . Anemia   . Hypothyroidism   . MR (mental retardation)     Past Surgical History  Procedure Laterality Date  . Cystoscopy with urethral dilatation N/A 12/29/2013    Procedure: CYSTOSCOPY WITH URETHRAL DILATATION;  Surgeon: Zachary Nestle, MD;  Location: [facility];  Service: Urology;  Laterality: N/A;  . Transurethral resection of prostate N/A 01/04/2014    Procedure: TRANSURETHRAL RESECTION OF THE PROSTATE (TURP) (procedure #2);  Surgeon: Zachary Nestle, MD;  Location: [facility];  Service: Urology;  Laterality: N/A;  . Circumcision N/A 01/04/2014    Procedure: CIRCUMCISION ADULT (procedure #1);  Surgeon: Zachary Nestle, MD;  Location: [facility];  Service: Urology;  Laterality: N/A;    Family History  Problem Relation Age of Onset  . Cancer Mother      Social History:  reports that he has never smoked. He has never used smokeless tobacco. He reports that he does not drink alcohol or use illicit drugs.  Allergies: No Known Allergies  Medications: I have reviewed the patient's current medications.  Results for orders placed or performed during the hospital encounter of 04/16/15 (from the past 48 hour(s))  Protime-INR     Status: None   Collection Time: 04/16/15  5:20 PM  Result Value Ref Range   Prothrombin Time 15.1 11.6 - 15.2 seconds   INR 1.17 0.00 - 1.49  CBC with Differential/Platelet     Status: Abnormal   Collection Time: 04/16/15  5:25 PM  Result Value Ref Range   WBC 4.4 4.0 - 10.5 K/uL   RBC 2.33 (L) 4.22 - 5.81 MIL/uL   Hemoglobin 7.1 (L) 13.0 - 17.0 g/dL   HCT 21.6 (L) 39.0 - 52.0 %   MCV 92.7 78.0 - 100.0 fL   MCH 30.5 26.0 - 34.0 pg   MCHC 32.9 30.0 - 36.0 g/dL   RDW 19.6 (H) 11.5 - 15.5 %   Platelets 125 (L) 150 - 400 K/uL   Neutrophils Relative % 53 43 - 77 %   Neutro Abs 2.3 1.7 - 7.7 K/uL   Lymphocytes Relative 31 12 - 46 %   Lymphs Abs 1.4 0.7 - 4.0 K/uL   Monocytes Relative 13 (H) 3 - 12 %   Monocytes Absolute 0.6 0.1 - 1.0 K/uL   Eosinophils Relative 3 0 - 5 %   Eosinophils Absolute  0.1 0.0 - 0.7 K/uL   Basophils Relative 0 0 - 1 %   Basophils Absolute 0.0 0.0 - 0.1 K/uL  Basic metabolic panel     Status: Abnormal   Collection Time: 04/16/15  5:25 PM  Result Value Ref Range   Sodium 140 135 - 145 mmol/L   Potassium 4.9 3.5 - 5.1 mmol/L   Chloride 109 101 - 111 mmol/L   CO2 22 22 - 32 mmol/L   Glucose, Bld 92 65 - 99 mg/dL   BUN 75 (H) 6 - 20 mg/dL   Creatinine, Ser 6.67 (H) 0.61 - 1.24 mg/dL   Calcium 8.8 (L) 8.9 - 10.3 mg/dL   GFR calc non Af Amer 8 (L) >60 mL/min   GFR calc Af Amer 9 (L) >60 mL/min    Comment: (NOTE) The eGFR has been calculated using the CKD EPI equation. This calculation has not been validated in all clinical situations. eGFR's persistently <60 mL/min signify possible  Chronic Kidney Disease.    Anion gap 9 5 - 15  Troponin I     Status: None   Collection Time: 04/16/15  5:25 PM  Result Value Ref Range   Troponin I <0.03 <0.031 ng/mL    Comment:        NO INDICATION OF MYOCARDIAL INJURY.   Urinalysis, Routine w reflex microscopic (not at [facility])     Status: Abnormal   Collection Time: 04/16/15 11:18 PM  Result Value Ref Range   Color, Urine RED (A) YELLOW    Comment: BIOCHEMICALS MAY BE AFFECTED BY COLOR   APPearance HAZY (A) CLEAR   Specific Gravity, Urine 1.020 1.005 - 1.030   pH 5.5 5.0 - 8.0   Glucose, UA NEGATIVE NEGATIVE mg/dL   Hgb urine dipstick LARGE (A) NEGATIVE   Bilirubin Urine NEGATIVE NEGATIVE   Ketones, ur NEGATIVE NEGATIVE mg/dL   Protein, ur 100 (A) NEGATIVE mg/dL   Urobilinogen, UA 0.2 0.0 - 1.0 mg/dL   Nitrite NEGATIVE NEGATIVE   Leukocytes, UA NEGATIVE NEGATIVE  Urine microscopic-add on     Status: Abnormal   Collection Time: 04/16/15 11:18 PM  Result Value Ref Range   Squamous Epithelial / LPF RARE RARE   WBC, UA 0-2 <3 WBC/hpf   RBC / HPF TOO NUMEROUS TO COUNT <3 RBC/hpf   Bacteria, UA FEW (A) RARE  MRSA PCR Screening     Status: None   Collection Time: 04/17/15  1:42 AM  Result Value Ref Range   MRSA by PCR NEGATIVE NEGATIVE    Comment:        The GeneXpert MRSA Assay (FDA approved for NASAL specimens only), is one component of a comprehensive MRSA colonization surveillance program. It is not intended to diagnose MRSA infection nor to guide or monitor treatment for MRSA infections.   Glucose, capillary     Status: None   Collection Time: 04/17/15  4:08 AM  Result Value Ref Range   Glucose-Capillary 77 65 - 99 mg/dL   Comment 1 Notify RN   Comprehensive metabolic panel     Status: Abnormal   Collection Time: 04/17/15  4:36 AM  Result Value Ref Range   Sodium 141 135 - 145 mmol/L   Potassium 4.7 3.5 - 5.1 mmol/L   Chloride 112 (H) 101 - 111 mmol/L   CO2 19 (L) 22 - 32 mmol/L   Glucose, Bld 78 65 - 99  mg/dL   BUN 77 (H) 6 - 20 mg/dL   Creatinine, Ser 6.87 (  H) 0.61 - 1.24 mg/dL   Calcium 8.6 (L) 8.9 - 10.3 mg/dL   Total Protein 6.4 (L) 6.5 - 8.1 g/dL   Albumin 3.5 3.5 - 5.0 g/dL   AST 14 (L) 15 - 41 U/L   ALT 16 (L) 17 - 63 U/L   Alkaline Phosphatase 36 (L) 38 - 126 U/L   Total Bilirubin 0.5 0.3 - 1.2 mg/dL   GFR calc non Af Amer 7 (L) >60 mL/min   GFR calc Af Amer 9 (L) >60 mL/min    Comment: (NOTE) The eGFR has been calculated using the CKD EPI equation. This calculation has not been validated in all clinical situations. eGFR's persistently <60 mL/min signify possible Chronic Kidney Disease.    Anion gap 10 5 - 15  CBC     Status: Abnormal   Collection Time: 04/17/15  4:36 AM  Result Value Ref Range   WBC 4.6 4.0 - 10.5 K/uL   RBC 2.19 (L) 4.22 - 5.81 MIL/uL   Hemoglobin 6.6 (LL) 13.0 - 17.0 g/dL    Comment: RESULT REPEATED AND VERIFIED CRITICAL RESULT CALLED TO, READ BACK BY AND VERIFIED WITH: JESSICA HEARN RN ON [id number] AT 0605 BY RESSEGGER R    HCT 20.3 (L) 39.0 - 52.0 %   MCV 92.7 78.0 - 100.0 fL   MCH 30.1 26.0 - 34.0 pg   MCHC 32.5 30.0 - 36.0 g/dL   RDW 19.8 (H) 11.5 - 15.5 %   Platelets 106 (L) 150 - 400 K/uL  Sodium, urine, random     Status: None   Collection Time: 04/17/15  6:10 AM  Result Value Ref Range   Sodium, Ur 60 mmol/L  Glucose, capillary     Status: None   Collection Time: 04/17/15  6:13 AM  Result Value Ref Range   Glucose-Capillary 82 65 - 99 mg/dL   Comment 1 Notify RN   Glucose, capillary     Status: None   Collection Time: 04/17/15  7:25 AM  Result Value Ref Range   Glucose-Capillary 77 65 - 99 mg/dL   Comment 1 Notify RN    Comment 2 Document in Chart   Type and screen     Status: None (Preliminary result)   Collection Time: 04/17/15  7:30 AM  Result Value Ref Range   ABO/RH(D) B POS    Antibody Screen NEG    Sample Expiration 04/20/2015    Unit Number [id number]    Blood Component Type RED CELLS,LR    Unit division 00     Status of Unit ALLOCATED    Transfusion Status OK TO TRANSFUSE    Crossmatch Result Compatible    Unit Number [id number]    Blood Component Type RED CELLS,LR    Unit division 00    Status of Unit ALLOCATED    Transfusion Status OK TO TRANSFUSE    Crossmatch Result Compatible   Prepare RBC     Status: None   Collection Time: 04/17/15  7:30 AM  Result Value Ref Range   Order Confirmation ORDER PROCESSED BY BLOOD BANK     Dg Chest 2 View  04/16/2015   CLINICAL DATA:  History of renal failure  EXAM: CHEST - 2 VIEW  COMPARISON:  12/25/2013  FINDINGS: The heart size and mediastinal contours are within normal limits. Both lungs are clear. The visualized skeletal structures are unremarkable.  IMPRESSION: No active disease.   Electronically Signed   By: Inez Catalina M.D.   On: 04/16/2015 18:04  US Renal  04/16/2015   CLINICAL DATA:  Bilateral hydronephrosis.  EXAM: RENAL / URINARY TRACT ULTRASOUND COMPLETE  COMPARISON:  03/02/2015  FINDINGS: Right Kidney:  Length: 13.3 cm. Severe hydronephrosis similar to comparison exam. Increased renal cortical echogenicity.  Left Kidney:  Length: 12.7 cm. Severe hydronephrosis with increased cortical echogenicity. No significant change from prior.  Bladder:  The bladder is distended with the large which postvoid residual of 1120 cc disease.  IMPRESSION: 1. No change in bilateral severe hydronephrosis. 2. No change in distended bladder with large postvoid residual. 3.   Electronically Signed   By: Suzy Bouchard M.D.   On: 04/16/2015 09:37    Review of Systems  Constitutional: Negative for fever and malaise/fatigue.  Respiratory: Negative for sputum production and shortness of breath.   Gastrointestinal: Negative for nausea, vomiting and abdominal pain.   Blood pressure 160/99, pulse 112, temperature 98.3 F (36.8 C), temperature source Oral, resp. rate 13, height _0  (1.651 m), weight 210 lb 15.7 oz (95.7 kg), SpO2 99 %. Physical Exam  Constitutional:  No distress.  Eyes: Left eye exhibits no discharge.  Neck: No JVD present.  Cardiovascular: Normal rate.   Respiratory: No respiratory distress.  GI: He exhibits distension. There is no tenderness. There is no rebound.  Musculoskeletal: He exhibits no edema.  Neurological: He is alert.    Assessment/Plan:  Problem #1 Acute kidney superimposed on chronic renal failure. Presently patient is distended bladder hence obstructive uropathy.  Problem #2 chronic renal failure: Presently multifactorial including history of pyelonephritis/obstructive uropathy/diabetes/hypertension. His last creatinine about 2 months ago was 3.47 with a GFR of 20 mL per night and hence stage IV. Problem # 3 hypertension: His blood pressure is reasonably controlled Problem #4 diabetes Problem #5 anemia: His hemoglobin and hematocrit is low and declining. This could be accommodation of iron deficiency and anemia of chronic disease. Problem #6 history of mental retardation Problem #7 hypothyroidism Problem #8 metabolic bone disease: His calcium is range. Patient with vitamin D deficiency he is on vitamin D supplement. Plan: We'll start hydrating patient once his obstructive uropathy is resolved. 2] presently patient doesn't need any dialysis. 3] we'll check again his iron studies 4] in the meantime patient may require blood transfusion.   Zachary [name] S 04/17/2015, 10:42 AM

## 2015-04-17 NOTE — Sedation Documentation (Signed)
approx 1400 cc urine returned

## 2015-04-17 NOTE — H&P (Signed)
Chief Complaint: No chief complaint on file.  Urinary retention A/C renal failure  Referring Physician(s): Kim,James  History of Present Illness: Zachary [name] is a 69 y.o. male   Pt with Hx BPH; Urinary retention TURP 2015 New elevation in Cr noted 02/2015 Work up reveals B Hydronephrosis; acute on chronic renal failure Distended bladder Dr Junious Silk follow pt Attempted foley insertion --was unsuccessful Noted bladder neck contracture Request for supra pubic catheter placement in IR Dr Annamaria Boots has reviewed imaging and approves procedure Now scheduled for same  Pt [place] Legal guardian has signed consent with [facility] RN  Past Medical History  Diagnosis Date  . Hypertension   . Diabetes mellitus   . Smoking   . Abnormal CT scan, kidney 10/06/2011  . UTI (lower urinary tract infection) 10/06/2011  . Bladder wall thickening 10/06/2011  . Acute pyelonephritis 10/07/2011  . 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 Poor historian poor historian  . BPH (benign prostatic hypertrophy)   . Anxiety     mental retardation  . Anemia   . Hypothyroidism   . MR (mental retardation)     Past Surgical History  Procedure Laterality Date  . Cystoscopy with urethral dilatation N/A 12/29/2013    Procedure: CYSTOSCOPY WITH URETHRAL DILATATION;  Surgeon: Marissa Nestle, MD;  Location: [facility];  Service: Urology;  Laterality: N/A;  . Transurethral resection of prostate N/A 01/04/2014    Procedure: TRANSURETHRAL RESECTION OF THE PROSTATE (TURP) (procedure #2);  Surgeon: Marissa Nestle, MD;  Location: [facility];  Service: Urology;  Laterality: N/A;  . Circumcision N/A 01/04/2014    Procedure: CIRCUMCISION ADULT (procedure #1);  Surgeon: Marissa Nestle, MD;  Location: [facility];  Service: Urology;  Laterality: N/A;    Allergies: Review of patient's allergies indicates no known allergies.  Medications: Prior to Admission medications   Medication Sig Start Date End Date Taking? Authorizing  Provider  carvedilol (COREG) 6.25 MG tablet Take 1 tablet (6.25 mg total) by mouth 2 (two) times daily with a meal. 03/08/15   [name], MD  ferrous sulfate 325 (65 FE) MG tablet Take 325 mg by mouth daily with breakfast.    Historical Provider, MD  isosorbide-hydrALAZINE (BIDIL) 20-37.5 MG per tablet Take 1 tablet by mouth 3 (three) times daily. 03/08/15   [name], MD  levothyroxine (SYNTHROID, LEVOTHROID) 75 MCG tablet Take 75 mcg by mouth daily before breakfast.     Historical Provider, MD  linagliptin (TRADJENTA) 5 MG TABS tablet Take 5 mg by mouth daily.    Historical Provider, MD  niacin (NIASPAN) 1000 MG CR tablet Take 1,000 mg by mouth at bedtime.    Historical Provider, MD  simvastatin (ZOCOR) 20 MG tablet Take 20 mg by mouth at bedtime.  02/28/15   Historical Provider, MD  torsemide (DEMADEX) 20 MG tablet Take 20 mg by mouth daily.  03/20/15   Historical Provider, MD  Vitamin D, Ergocalciferol, (DRISDOL) 50000 UNITS CAPS capsule Take 1 capsule (50,000 Units total) by mouth every 7 (seven) days. Patient taking differently: Take 50,000 Units by mouth every 7 (seven) days. On Mondays 03/08/15   [name], MD     Family History  Problem Relation Age of Onset  . Cancer Mother     History   Social History  . Marital Status: Single    Spouse Name: N/A  . Number of Children: N/A  . Years of Education: N/A   Social History Main Topics  . Smoking status:  Never Smoker   . Smokeless tobacco: Never Used  . Alcohol Use: No     Comment: occ. use   . Drug Use: No  . Sexual Activity: No   Other Topics Concern  . Not on file   Social History Narrative   Zachary [name] lives with him.      Review of Systems: A 12 point ROS discussed and pertinent positives are indicated in the HPI above.  All other systems are negative.  Review of Systems  Constitutional: Positive for activity change and fatigue. Negative for fever.  Respiratory: Negative for shortness of breath.     Gastrointestinal: Positive for abdominal pain and abdominal distention.  Genitourinary: Positive for difficulty urinating.  Psychiatric/Behavioral: Negative for agitation.       Mildly mentally handicapped    Vital Signs: BP 137/88 mmHg  Pulse 102  Resp 18  SpO2 98%  Physical Exam  Constitutional: He is oriented to person, place, and time. He appears well-nourished.  Cardiovascular: Normal rate and regular rhythm.   Pulmonary/Chest: Effort normal and breath sounds normal.  Abdominal: Soft. Bowel sounds are normal.  Musculoskeletal: Normal range of motion.  Neurological: He is alert and oriented to person, place, and time.  Skin: Skin is warm and dry.  Psychiatric:  Mentally handicapped [place]---consent signed with legal guardian with [facility] RN; in chart  Nursing note and vitals reviewed.   Mallampati Score:  MD Evaluation Airway: WNL Heart: WNL Abdomen: WNL Chest/ Lungs: WNL ASA  Classification: 3 Mallampati/Airway Score: Two  Imaging: Dg Chest 2 View  04/16/2015   CLINICAL DATA:  History of renal failure  EXAM: CHEST - 2 VIEW  COMPARISON:  12/25/2013  FINDINGS: The heart size and mediastinal contours are within normal limits. Both lungs are clear. The visualized skeletal structures are unremarkable.  IMPRESSION: No active disease.   Electronically Signed   By: Inez Catalina M.D.   On: 04/16/2015 18:04   US Renal  04/16/2015   CLINICAL DATA:  Bilateral hydronephrosis.  EXAM: RENAL / URINARY TRACT ULTRASOUND COMPLETE  COMPARISON:  03/02/2015  FINDINGS: Right Kidney:  Length: 13.3 cm. Severe hydronephrosis similar to comparison exam. Increased renal cortical echogenicity.  Left Kidney:  Length: 12.7 cm. Severe hydronephrosis with increased cortical echogenicity. No significant change from prior.  Bladder:  The bladder is distended with the large which postvoid residual of 1120 cc disease.  IMPRESSION: 1. No change in bilateral severe hydronephrosis. 2. No change in  distended bladder with large postvoid residual. 3.   Electronically Signed   By: Suzy Bouchard M.D.   On: 04/16/2015 09:37    Labs:  CBC:  Recent Labs  03/07/15 0613 03/08/15 0747 04/10/15 0814 04/16/15 1725 04/17/15 0436  WBC 5.5 4.4  --  4.4 4.6  HGB 8.5* 8.6* 8.5* 7.1* 6.6*  HCT 27.1* 27.2* 25.0* 21.6* 20.3*  PLT 144* 143*  --  125* 106*    COAGS:  Recent Labs  03/02/15 1333 04/16/15 1720  INR 1.05 1.17    BMP:  Recent Labs  03/07/15 0612  03/08/15 0746 04/10/15 0814 04/16/15 1725 04/17/15 0436  NA 144  < > 145 143 140 141  K 4.5  --  4.8 6.1* 4.9 4.7  CL 120*  --  122*  --  109 112*  CO2 17*  --  19*  --  22 19*  GLUCOSE 72  --  83 114* 92 78  BUN 30*  --  29*  --  75*  77*  CALCIUM 8.7*  --  8.7*  --  8.8* 8.6*  CREATININE 3.46*  --  3.47*  --  6.67* 6.87*  GFRNONAA 17*  --  17*  --  8* 7*  GFRAA 20*  --  20*  --  9* 9*  < > = values in this interval not displayed.  LIVER FUNCTION TESTS:  Recent Labs  03/02/15 1333  03/06/15 0641 03/07/15 0612 03/08/15 0746 04/17/15 0436  BILITOT 0.6  --   --   --   --  0.5  AST 27  --   --   --   --  14*  ALT 18  --   --   --   --  16*  ALKPHOS 56  --   --   --   --  36*  PROT 7.6  --   --   --   --  6.4*  ALBUMIN 3.9  < > 3.1* 3.0* 3.0* 3.5  < > = values in this interval not displayed.  TUMOR MARKERS: No results for input(s): AFPTM, CEA, CA199, CHROMGRNA in the last 8760 hours.  Assessment and Plan:  Urinary retention- worsening Cr Bladder neck contracture Cannot place foley--attempted by Dr Junious Silk- Urology Now scheduled for suprapubic catheter placement pts guardian is aware of procedure benefits and risks including but not limited to: Infection; bleeding; damgage to bladder and surrounding structures. Agreeable to proceed. Consent signed andin chart  Thank you for this interesting consult.  I greatly enjoyed meeting Zachary [name] and look forward to participating in their  care.  Signed: Artyom Stencel A 04/17/2015, 12:47 PM   I spent a total of 40 Minutes    in face to face in clinical consultation, greater than 50% of which was counseling/coordinating care for supra pubic cath placement

## 2015-04-17 NOTE — Sedation Documentation (Signed)
SP in- 700 cc yellow urine returned

## 2015-04-17 NOTE — Progress Notes (Signed)
Called report to Ophelia Shoulder, RN on dept 300. Verbalized understanding. Pt transferred to floor in safe and stable condition. Schonewitz, Eulis Canner 04/17/2015

## 2015-04-18 LAB — PHOSPHORUS: Phosphorus: 6.3 mg/dL — ABNORMAL HIGH (ref 2.5–4.6)

## 2015-04-18 LAB — FERRITIN: Ferritin: 82 ng/mL (ref 24–336)

## 2015-04-18 LAB — GLUCOSE, CAPILLARY
Glucose-Capillary: 80 mg/dL (ref 65–99)
Glucose-Capillary: 86 mg/dL (ref 65–99)
Glucose-Capillary: 100 mg/dL — ABNORMAL HIGH (ref 65–99)
Glucose-Capillary: 142 mg/dL — ABNORMAL HIGH (ref 65–99)
Glucose-Capillary: 147 mg/dL — ABNORMAL HIGH (ref 65–99)
Glucose-Capillary: 157 mg/dL — ABNORMAL HIGH (ref 65–99)

## 2015-04-18 LAB — IRON AND TIBC
Iron: 60 ug/dL (ref 45–182)
Saturation Ratios: 24 % (ref 17.9–39.5)
TIBC: 255 ug/dL (ref 250–450)
UIBC: 195 ug/dL

## 2015-04-18 LAB — BASIC METABOLIC PANEL
Anion gap: 10 (ref 5–15)
BUN: 75 mg/dL — ABNORMAL HIGH (ref 6–20)
CO2: 21 mmol/L — ABNORMAL LOW (ref 22–32)
Calcium: 8.9 mg/dL (ref 8.9–10.3)
Chloride: 116 mmol/L — ABNORMAL HIGH (ref 101–111)
Creatinine, Ser: 6.34 mg/dL — ABNORMAL HIGH (ref 0.61–1.24)
GFR calc Af Amer: 9 mL/min — ABNORMAL LOW (ref >60)
GFR calc non Af Amer: 8 mL/min — ABNORMAL LOW (ref >60)
Glucose, Bld: 98 mg/dL (ref 65–99)
Potassium: 5 mmol/L (ref 3.5–5.1)
Sodium: 147 mmol/L — ABNORMAL HIGH (ref 135–145)

## 2015-04-18 LAB — CALCIUM / CREATININE RATIO, URINE
Calcium, Ur: <0.8 mg/dL
Calcium/Creat.Ratio: <13 mg/g creat (ref 0–260)
Creatinine, Urine: 63.9 mg/dL

## 2015-04-18 LAB — CBC
HCT: 24.3 % — ABNORMAL LOW (ref 39.0–52.0)
Hemoglobin: 8 g/dL — ABNORMAL LOW (ref 13.0–17.0)
MCH: 30 pg (ref 26.0–34.0)
MCHC: 32.9 g/dL (ref 30.0–36.0)
MCV: 91 fL (ref 78.0–100.0)
Platelets: 108 10*3/uL — ABNORMAL LOW (ref 150–400)
RBC: 2.67 MIL/uL — ABNORMAL LOW (ref 4.22–5.81)
RDW: 20.1 % — ABNORMAL HIGH (ref 11.5–15.5)
WBC: 4.3 10*3/uL (ref 4.0–10.5)

## 2015-04-18 LAB — HEMOGLOBIN A1C
Hgb A1c MFr Bld: 5.8 % — ABNORMAL HIGH (ref 4.8–5.6)
Mean Plasma Glucose: 120 mg/dL

## 2015-04-18 MED ORDER — SEVELAMER CARBONATE 800 MG PO TABS
800.0000 mg | ORAL_TABLET | Freq: Two times a day (BID) | ORAL | Status: DC
  Administered 2015-04-18 – 2015-04-23 (×10): 800 mg via ORAL
  Filled 2015-04-18 (×10): qty 1

## 2015-04-18 MED ORDER — SODIUM BICARBONATE 650 MG PO TABS
650.0000 mg | ORAL_TABLET | Freq: Three times a day (TID) | ORAL | Status: DC
  Administered 2015-04-18 – 2015-04-23 (×16): 650 mg via ORAL
  Filled 2015-04-18 (×16): qty 1

## 2015-04-18 NOTE — Progress Notes (Signed)
Subjective: Patient is resting. Suprapubic catheter was inserted yesterday by interventional radiologist.  He had over 3 liters of urine out put. No significant change in his BUN and CR today. No new complaint. Objective: Vital signs in last 24 hours: Temp:  [97.8 F (36.6 C)-98.7 F (37.1 C)] 98.7 F (37.1 C) (06/22 0611) Pulse Rate:  [75-112] 99 (06/22 0611) Resp:  [10-20] 15 (06/22 0611) BP: (136-167)/(79-99) 136/79 mmHg (06/22 0611) SpO2:  [96 %-100 %] 99 % (06/22 0611) Weight:  [88.633 kg (195 lb 6.4 oz)] 88.633 kg (195 lb 6.4 oz) (06/22 0611) Weight change: -0.726 kg (-1 lb 9.6 oz) Last BM Date: 04/16/15  Intake/Output from previous day: 06/21 0701 - 06/22 0700 In: 825 [P.O.:240; I.V.:250; Blood:335] Out: 3400 [Urine:3400]  PHYSICAL EXAM General appearance: alert and no distress Resp: clear to auscultation bilaterally Cardio: S1, S2 normal GI: soft, non-tender; bowel sounds normal; no masses,  no organomegaly Extremities: 2++ leg edema  Lab Results:  Results for orders placed or performed during the hospital encounter of 04/16/15 (from the past 48 hour(s))  Protime-INR     Status: None   Collection Time: 04/16/15  5:20 PM  Result Value Ref Range   Prothrombin Time 15.1 11.6 - 15.2 seconds   INR 1.17 0.00 - 1.49  CBC with Differential/Platelet     Status: Abnormal   Collection Time: 04/16/15  5:25 PM  Result Value Ref Range   WBC 4.4 4.0 - 10.5 K/uL   RBC 2.33 (L) 4.22 - 5.81 MIL/uL   Hemoglobin 7.1 (L) 13.0 - 17.0 g/dL   HCT 21.6 (L) 39.0 - 52.0 %   MCV 92.7 78.0 - 100.0 fL   MCH 30.5 26.0 - 34.0 pg   MCHC 32.9 30.0 - 36.0 g/dL   RDW 19.6 (H) 11.5 - 15.5 %   Platelets 125 (L) 150 - 400 K/uL   Neutrophils Relative % 53 43 - 77 %   Neutro Abs 2.3 1.7 - 7.7 K/uL   Lymphocytes Relative 31 12 - 46 %   Lymphs Abs 1.4 0.7 - 4.0 K/uL   Monocytes Relative 13 (H) 3 - 12 %   Monocytes Absolute 0.6 0.1 - 1.0 K/uL   Eosinophils Relative 3 0 - 5 %   Eosinophils Absolute  0.1 0.0 - 0.7 K/uL   Basophils Relative 0 0 - 1 %   Basophils Absolute 0.0 0.0 - 0.1 K/uL  Basic metabolic panel     Status: Abnormal   Collection Time: 04/16/15  5:25 PM  Result Value Ref Range   Sodium 140 135 - 145 mmol/L   Potassium 4.9 3.5 - 5.1 mmol/L   Chloride 109 101 - 111 mmol/L   CO2 22 22 - 32 mmol/L   Glucose, Bld 92 65 - 99 mg/dL   BUN 75 (H) 6 - 20 mg/dL   Creatinine, Ser 6.67 (H) 0.61 - 1.24 mg/dL   Calcium 8.8 (L) 8.9 - 10.3 mg/dL   GFR calc non Af Amer 8 (L) >60 mL/min   GFR calc Af Amer 9 (L) >60 mL/min    Comment: (NOTE) The eGFR has been calculated using the CKD EPI equation. This calculation has not been validated in all clinical situations. eGFR's persistently <60 mL/min signify possible Chronic Kidney Disease.    Anion gap 9 5 - 15  Troponin I     Status: None   Collection Time: 04/16/15  5:25 PM  Result Value Ref Range   Troponin I <0.03 <0.031 ng/mL  Comment:        NO INDICATION OF MYOCARDIAL INJURY.   Hemoglobin A1c     Status: Abnormal   Collection Time: 04/16/15  8:30 PM  Result Value Ref Range   Hgb A1c MFr Bld 5.8 (H) 4.8 - 5.6 %    Comment: (NOTE)         Pre-diabetes: 5.7 - 6.4         Diabetes: >6.4         Glycemic control for adults with diabetes: <7.0    Mean Plasma Glucose 120 mg/dL    Comment: (NOTE) Performed At: [facility] [place], [place] [id number] Lindon Romp MD NM:[id number]   Urinalysis, Routine w reflex microscopic (not at [facility])     Status: Abnormal   Collection Time: 04/16/15 11:18 PM  Result Value Ref Range   Color, Urine RED (A) YELLOW    Comment: BIOCHEMICALS MAY BE AFFECTED BY COLOR   APPearance HAZY (A) CLEAR   Specific Gravity, Urine 1.020 1.005 - 1.030   pH 5.5 5.0 - 8.0   Glucose, UA NEGATIVE NEGATIVE mg/dL   Hgb urine dipstick LARGE (A) NEGATIVE   Bilirubin Urine NEGATIVE NEGATIVE   Ketones, ur NEGATIVE NEGATIVE mg/dL   Protein, ur 100 (A) NEGATIVE mg/dL   Urobilinogen,  UA 0.2 0.0 - 1.0 mg/dL   Nitrite NEGATIVE NEGATIVE   Leukocytes, UA NEGATIVE NEGATIVE  Urine microscopic-add on     Status: Abnormal   Collection Time: 04/16/15 11:18 PM  Result Value Ref Range   Squamous Epithelial / LPF RARE RARE   WBC, UA 0-2 <3 WBC/hpf   RBC / HPF TOO NUMEROUS TO COUNT <3 RBC/hpf   Bacteria, UA FEW (A) RARE  MRSA PCR Screening     Status: None   Collection Time: 04/17/15  1:42 AM  Result Value Ref Range   MRSA by PCR NEGATIVE NEGATIVE    Comment:        The GeneXpert MRSA Assay (FDA approved for NASAL specimens only), is one component of a comprehensive MRSA colonization surveillance program. It is not intended to diagnose MRSA infection nor to guide or monitor treatment for MRSA infections.   Glucose, capillary     Status: None   Collection Time: 04/17/15  4:08 AM  Result Value Ref Range   Glucose-Capillary 77 65 - 99 mg/dL   Comment 1 Notify RN   Comprehensive metabolic panel     Status: Abnormal   Collection Time: 04/17/15  4:36 AM  Result Value Ref Range   Sodium 141 135 - 145 mmol/L   Potassium 4.7 3.5 - 5.1 mmol/L   Chloride 112 (H) 101 - 111 mmol/L   CO2 19 (L) 22 - 32 mmol/L   Glucose, Bld 78 65 - 99 mg/dL   BUN 77 (H) 6 - 20 mg/dL   Creatinine, Ser 6.87 (H) 0.61 - 1.24 mg/dL   Calcium 8.6 (L) 8.9 - 10.3 mg/dL   Total Protein 6.4 (L) 6.5 - 8.1 g/dL   Albumin 3.5 3.5 - 5.0 g/dL   AST 14 (L) 15 - 41 U/L   ALT 16 (L) 17 - 63 U/L   Alkaline Phosphatase 36 (L) 38 - 126 U/L   Total Bilirubin 0.5 0.3 - 1.2 mg/dL   GFR calc non Af Amer 7 (L) >60 mL/min   GFR calc Af Amer 9 (L) >60 mL/min    Comment: (NOTE) The eGFR has been calculated using the CKD EPI equation. This  calculation has not been validated in all clinical situations. eGFR's persistently <60 mL/min signify possible Chronic Kidney Disease.    Anion gap 10 5 - 15  CBC     Status: Abnormal   Collection Time: 04/17/15  4:36 AM  Result Value Ref Range   WBC 4.6 4.0 - 10.5 K/uL    RBC 2.19 (L) 4.22 - 5.81 MIL/uL   Hemoglobin 6.6 (LL) 13.0 - 17.0 g/dL    Comment: RESULT REPEATED AND VERIFIED CRITICAL RESULT CALLED TO, READ BACK BY AND VERIFIED WITH: JESSICA HEARN RN ON [id number] AT 0605 BY RESSEGGER R    HCT 20.3 (L) 39.0 - 52.0 %   MCV 92.7 78.0 - 100.0 fL   MCH 30.1 26.0 - 34.0 pg   MCHC 32.5 30.0 - 36.0 g/dL   RDW 19.8 (H) 11.5 - 15.5 %   Platelets 106 (L) 150 - 400 K/uL  Sodium, urine, random     Status: None   Collection Time: 04/17/15  6:10 AM  Result Value Ref Range   Sodium, Ur 60 mmol/L  Glucose, capillary     Status: None   Collection Time: 04/17/15  6:13 AM  Result Value Ref Range   Glucose-Capillary 82 65 - 99 mg/dL   Comment 1 Notify RN   Glucose, capillary     Status: None   Collection Time: 04/17/15  7:25 AM  Result Value Ref Range   Glucose-Capillary 77 65 - 99 mg/dL   Comment 1 Notify RN    Comment 2 Document in Chart   Type and screen     Status: None (Preliminary result)   Collection Time: 04/17/15  7:30 AM  Result Value Ref Range   ABO/RH(D) B POS    Antibody Screen NEG    Sample Expiration 04/20/2015    Unit Number [id number]    Blood Component Type RED CELLS,LR    Unit division 00    Status of Unit ISSUED    Transfusion Status OK TO TRANSFUSE    Crossmatch Result Compatible    Unit Number [id number]    Blood Component Type RED CELLS,LR    Unit division 00    Status of Unit ALLOCATED    Transfusion Status OK TO TRANSFUSE    Crossmatch Result Compatible   Prepare RBC     Status: None   Collection Time: 04/17/15  7:30 AM  Result Value Ref Range   Order Confirmation ORDER PROCESSED BY BLOOD BANK   Glucose, capillary     Status: Abnormal   Collection Time: 04/17/15  4:45 PM  Result Value Ref Range   Glucose-Capillary 141 (H) 65 - 99 mg/dL  Glucose, capillary     Status: None   Collection Time: 04/17/15  9:20 PM  Result Value Ref Range   Glucose-Capillary 82 65 - 99 mg/dL   Comment 1 Notify RN    Comment 2 Document in  Chart   Glucose, capillary     Status: None   Collection Time: 04/18/15  1:37 AM  Result Value Ref Range   Glucose-Capillary 86 65 - 99 mg/dL  Basic metabolic panel     Status: Abnormal   Collection Time: 04/18/15  5:07 AM  Result Value Ref Range   Sodium 147 (H) 135 - 145 mmol/L   Potassium 5.0 3.5 - 5.1 mmol/L   Chloride 116 (H) 101 - 111 mmol/L   CO2 21 (L) 22 - 32 mmol/L   Glucose, Bld 98 65 - 99 mg/dL   BUN  75 (H) 6 - 20 mg/dL   Creatinine, Ser 6.34 (H) 0.61 - 1.24 mg/dL   Calcium 8.9 8.9 - 10.3 mg/dL   GFR calc non Af Amer 8 (L) >60 mL/min   GFR calc Af Amer 9 (L) >60 mL/min    Comment: (NOTE) The eGFR has been calculated using the CKD EPI equation. This calculation has not been validated in all clinical situations. eGFR's persistently <60 mL/min signify possible Chronic Kidney Disease.    Anion gap 10 5 - 15  CBC     Status: Abnormal   Collection Time: 04/18/15  5:07 AM  Result Value Ref Range   WBC 4.3 4.0 - 10.5 K/uL   RBC 2.67 (L) 4.22 - 5.81 MIL/uL   Hemoglobin 8.0 (L) 13.0 - 17.0 g/dL   HCT 24.3 (L) 39.0 - 52.0 %   MCV 91.0 78.0 - 100.0 fL   MCH 30.0 26.0 - 34.0 pg   MCHC 32.9 30.0 - 36.0 g/dL   RDW 20.1 (H) 11.5 - 15.5 %   Platelets 108 (L) 150 - 400 K/uL    Comment: SPECIMEN CHECKED FOR CLOTS PLATELET COUNT CONFIRMED BY SMEAR   Phosphorus     Status: Abnormal   Collection Time: 04/18/15  5:07 AM  Result Value Ref Range   Phosphorus 6.3 (H) 2.5 - 4.6 mg/dL  Glucose, capillary     Status: Abnormal   Collection Time: 04/18/15  7:16 AM  Result Value Ref Range   Glucose-Capillary 100 (H) 65 - 99 mg/dL   Comment 1 Notify RN    Comment 2 Document in Chart     ABGS No results for input(s): PHART, PO2ART, TCO2, HCO3 in the last 72 hours.  Invalid input(s): PCO2 CULTURES Recent Results (from the past 240 hour(s))  MRSA PCR Screening     Status: None   Collection Time: 04/17/15  1:42 AM  Result Value Ref Range Status   MRSA by PCR NEGATIVE NEGATIVE  Final    Comment:        The GeneXpert MRSA Assay (FDA approved for NASAL specimens only), is one component of a comprehensive MRSA colonization surveillance program. It is not intended to diagnose MRSA infection nor to guide or monitor treatment for MRSA infections.    Studies/Results: Dg Chest 2 View  04/16/2015   CLINICAL DATA:  History of renal failure  EXAM: CHEST - 2 VIEW  COMPARISON:  12/25/2013  FINDINGS: The heart size and mediastinal contours are within normal limits. Both lungs are clear. The visualized skeletal structures are unremarkable.  IMPRESSION: No active disease.   Electronically Signed   By: Inez Catalina M.D.   On: 04/16/2015 18:04   US Renal  04/16/2015   CLINICAL DATA:  Bilateral hydronephrosis.  EXAM: RENAL / URINARY TRACT ULTRASOUND COMPLETE  COMPARISON:  03/02/2015  FINDINGS: Right Kidney:  Length: 13.3 cm. Severe hydronephrosis similar to comparison exam. Increased renal cortical echogenicity.  Left Kidney:  Length: 12.7 cm. Severe hydronephrosis with increased cortical echogenicity. No significant change from prior.  Bladder:  The bladder is distended with the large which postvoid residual of 1120 cc disease.  IMPRESSION: 1. No change in bilateral severe hydronephrosis. 2. No change in distended bladder with large postvoid residual. 3.   Electronically Signed   By: Suzy Bouchard M.D.   On: 04/16/2015 09:37   Ir US Guide Bx Asp/drain  04/17/2015   CLINICAL DATA:  Bladder outlet obstruction, urinary retention  EXAM: ULTRASOUND 12 FRENCH SUPRAPUBIC CATHETER INSERTION  Date:  6/21/20166/21/2016 1:44 pm  Radiologist:  M. Daryll Brod, MD  Guidance:  Ultrasound  FLUOROSCOPY TIME:  None.  MEDICATIONS AND MEDICAL HISTORY: 2 mg Versed, 50 mcg fentanyl  ANESTHESIA/SEDATION: 10 minutes  CONTRAST:  None.  COMPLICATIONS: None immediate  PROCEDURE: Informed consent was obtained from the patient following explanation of the procedure, risks, benefits and alternatives. The patient  understands, agrees and consents for the procedure. All questions were addressed. A time out was performed.  Maximal barrier sterile technique utilized including caps, mask, sterile gowns, sterile gloves, large sterile drape, hand hygiene, and ChloraPrep.  Preliminary ultrasound performed. Distended bladder localized in the midline of the pelvis. Under sterile conditions and local anesthesia, an 18 gauge introducer needle was advanced under direct ultrasound into the distended bladder. Needle position confirmed with ultrasound. Images obtained for documentation. Amplatz guidewire inserted followed by tract dilatation to advance a 12 [place] drain. Drain catheter confirmed within the bladder with ultrasound. Catheter secured with Prolene suture and connected to external gravity bag. Sterile dressing applied. No immediate complication. Patient tolerated the procedure well.  IMPRESSION: Successful ultrasound 12 French suprapubic bladder catheter insertion.   Electronically Signed   By: Jerilynn Mages.  Shick M.D.   On: 04/17/2015 14:26    Medications: I have reviewed the patient's current medications.  Assesment:   Active Problems:   Acute renal failure   BPH (benign prostatic hypertrophy) with urinary retention   Anemia   Obstructive uropathy   ARF (acute renal failure)    Plan:  Medications reviewed Continue regular treatment Nephrology consult appreciated Will monitor CBC/BMP    LOS: 1 day   Zachary [name] 04/18/2015, 8:14 AM

## 2015-04-18 NOTE — Clinical Documentation Improvement (Signed)
Presents with ARF secondary to bladder outlet obstruction. Suprapubic tube placed.    Anemia is documented  H&H on admission was 7/21, dropped to 6.6/20  Transfused with PRBC's  Post transfusion draw H&H = 8/24  Please specify the type of anemia your patient has and document findings in next progress note and include in discharge summary if applicable.  Acute Blood Loss Anemia Precipitous drop in Hematocrit Acute on chronic blood loss anemia Other Condition Cannot Clinically Determine   Thank You, Zoila Shutter ,RN Clinical Documentation Specialist:  [place] Information Management

## 2015-04-18 NOTE — Progress Notes (Signed)
Zachary [name]  MRN: [id number]  DOB/AGE: 1945/11/28 69 y.o.  Primary Care Physician:FANTA,TESFAYE, MD  Admit date: 04/16/2015  Chief Complaint:  No chief complaint on file.   [name]-Pt presented on  04/16/2015 with  No chief complaint on file. .    Pt offers no new complaints .     Meds . carvedilol  6.25 mg Oral BID WC  . ferrous sulfate  325 mg Oral Q breakfast  . insulin aspart  0-9 Units Subcutaneous TID WC  . isosorbide-hydrALAZINE  1 tablet Oral TID  . levothyroxine  75 mcg Oral QAC breakfast  . niacin  1,000 mg Oral QHS  . simvastatin  20 mg Oral QHS  . sodium chloride  3 mL Intravenous Q12H  . [START ON 04/23/2015] Vitamin D (Ergocalciferol)  50,000 Units Oral Q7 days     Physical Exam: Vital signs in last 24 hours: Temp:  [97.8 F (36.6 C)-98.7 F (37.1 C)] 98.7 F (37.1 C) (06/22 [id number]) Pulse Rate:  [75-112] 99 (06/22 0611) Resp:  [13-20] 15 (06/22 0611) BP: (136-167)/(79-99) 136/79 mmHg (06/22 0611) SpO2:  [96 %-100 %] 99 % (06/22 0611) Weight:  [195 lb 6.4 oz (88.633 kg)] 195 lb 6.4 oz (88.633 kg) (06/22 0611) Weight change: -1 lb 9.6 oz (-0.726 kg) Last BM Date: 04/16/15  Intake/Output from previous day: 06/21 0701 - 06/22 0700 In: 825 [P.O.:240; I.V.:250; Blood:335] Out: 3400 [Urine:3400]     Physical Exam: General- pt is awake,alert, follow commands Resp- No acute REsp distress, CTA B/L NO Rhonchi CVS- S1S2 regular in rate and rhythm GIT- BS+, soft, NT, ND, Supra pubic cath insitu EXT- NO LE Edema, NO Cyanosis   Lab Results: CBC  Recent Labs  04/17/15 0436 04/18/15 0507  WBC 4.6 4.3  HGB 6.6* 8.0*  HCT 20.3* 24.3*  PLT 106* 108*    BMET  Recent Labs  04/17/15 0436 04/18/15 0507  NA 141 147*  K 4.7 5.0  CL 112* 116*  CO2 19* 21*  GLUCOSE 78 98  BUN 77* 75*  CREATININE 6.87* 6.34*  CALCIUM 8.6* 8.9   Trend Creat     2016 6.67=>6.87=>6.34 3.69=>4.23=>3.67=>3.46--3.47( last admission) 2015   3.23==>1.67  MICRO Recent  Results (from the past 240 hour([name]))  MRSA PCR Screening     Status: None   Collection Time: 04/17/15  1:42 AM  Result Value Ref Range Status   MRSA by PCR NEGATIVE NEGATIVE Final    Comment:        The GeneXpert MRSA Assay (FDA approved for NASAL specimens only), is one component of a comprehensive MRSA colonization surveillance program. It is not intended to diagnose MRSA infection nor to guide or monitor treatment for MRSA infections.       Lab Results  Component Value Date   PTH 130* 03/03/2015   PTH Comment 03/03/2015   CALCIUM 8.9 04/18/2015   CAION 1.17 12/25/2013   PHOS 6.3* 04/18/2015          Renal u/[name] in 2015 1. Moderate bilateral hydronephrosis. There is also evidence of  hydroureter bilaterally. No obstructing masses nor appreciable  calculi identified. Clinical correlation recommended and if  clinically warranted further evaluation with renal protocol CT is  recommended.  2. Trabeculated bladder wall and enlarged prostate. These findings  may reflect sequela of a component of bladder outlet obstruction.  Clinical correlation recommended.  Renal u/[name] in May 2016 Right Kidney 14.6 cm. Diffusely echogenic. No significant change in dilatation of the renal collecting system.  Left Kidney:: 13.6 cm. Diffusely echogenic. No significant change in dilatation of the renal collecting system.  Bladder:Distended, with a calculated prevoid volume of 1137 cc and postvoid volume of 836 cc. The previously seen bladder mass is no longer Visualized.  Renal U/[name] in June 2016 Right Kidney:  Length: 13.3 cm. Severe hydronephrosis similar to comparison exam. Increased renal cortical echogenicity.  Left Kidney:  Length: 12.7 cm. Severe hydronephrosis with increased cortical echogenicity. No significant change from prior.  Bladder:  The bladder is distended with the large which postvoid residual of 1120 cc disease.  IMPRESSION: 1. No change in  bilateral severe hydronephrosis. 2. No change in distended bladder with large postvoid residual.  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 Phosphorus not  at goal.   5)CNS-Hx of mental Retardation  PMD following  6)Electrolytes Normokalemic  Hypernatremia    7)Acid base Co2 not  at goal. Will start PO Bicarb  8) Urology- admitted with hydronephrosis     Now [name]/p Suprapubic cath  Plan:  Will start PO bicarb Will start binders Will increase free water inatke  Zachary [name],Zachary [name] 04/18/2015, 9:04 AM

## 2015-04-19 LAB — GLUCOSE, CAPILLARY
Glucose-Capillary: 134 mg/dL — ABNORMAL HIGH (ref 65–99)
Glucose-Capillary: 158 mg/dL — ABNORMAL HIGH (ref 65–99)
Glucose-Capillary: 90 mg/dL (ref 65–99)
Glucose-Capillary: 119 mg/dL — ABNORMAL HIGH (ref 65–99)

## 2015-04-19 LAB — CBC
HCT: 24.3 % — ABNORMAL LOW (ref 39.0–52.0)
Hemoglobin: 8 g/dL — ABNORMAL LOW (ref 13.0–17.0)
MCH: 30.3 pg (ref 26.0–34.0)
MCHC: 32.9 g/dL (ref 30.0–36.0)
MCV: 92 fL (ref 78.0–100.0)
Platelets: 100 10*3/uL — ABNORMAL LOW (ref 150–400)
RBC: 2.64 MIL/uL — ABNORMAL LOW (ref 4.22–5.81)
RDW: 20.1 % — ABNORMAL HIGH (ref 11.5–15.5)
WBC: 6.6 10*3/uL (ref 4.0–10.5)

## 2015-04-19 LAB — BASIC METABOLIC PANEL
Anion gap: 9 (ref 5–15)
BUN: 63 mg/dL — ABNORMAL HIGH (ref 6–20)
CO2: 22 mmol/L (ref 22–32)
Calcium: 8.8 mg/dL — ABNORMAL LOW (ref 8.9–10.3)
Chloride: 117 mmol/L — ABNORMAL HIGH (ref 101–111)
Creatinine, Ser: 5.55 mg/dL — ABNORMAL HIGH (ref 0.61–1.24)
GFR calc Af Amer: 11 mL/min — ABNORMAL LOW (ref >60)
GFR calc non Af Amer: 10 mL/min — ABNORMAL LOW (ref >60)
Glucose, Bld: 124 mg/dL — ABNORMAL HIGH (ref 65–99)
Potassium: 4.8 mmol/L (ref 3.5–5.1)
Sodium: 148 mmol/L — ABNORMAL HIGH (ref 135–145)

## 2015-04-19 MED ORDER — SODIUM CHLORIDE 0.45 % IV SOLN
INTRAVENOUS | Status: DC
  Administered 2015-04-19 – 2015-04-23 (×11): via INTRAVENOUS

## 2015-04-19 NOTE — Progress Notes (Addendum)
Subjective: Patient offers no complaints. Presently he denies any nausea or vomiting. His appetite is good and no difficulty in breathing.   Objective: Vital signs in last 24 hours: Temp:  [98.4 F (36.9 C)-98.7 F (37.1 C)] 98.7 F (37.1 C) (06/23 0528) Pulse Rate:  [96-99] 96 (06/23 0528) Resp:  [16-20] 16 (06/23 0528) BP: (131-146)/(65-89) 131/65 mmHg (06/23 0528) SpO2:  [100 %] 100 % (06/23 0528) Weight:  [191 lb 3.2 oz (86.728 kg)] 191 lb 3.2 oz (86.728 kg) (06/23 0539)  Intake/Output from previous day: 06/22 0701 - 06/23 0700 In: [id number] [P.O.:1320; I.V.:3] Out: 3550 [Urine:3550] Intake/Output this shift:     Recent Labs  04/16/15 1725 04/17/15 0436 04/18/15 0507 04/19/15 0456  HGB 7.1* 6.6* 8.0* 8.0*    Recent Labs  04/18/15 0507 04/19/15 0456  WBC 4.3 6.6  RBC 2.67* 2.64*  HCT 24.3* 24.3*  PLT 108* 100*    Recent Labs  04/17/15 0436 04/18/15 0507  NA 141 147*  K 4.7 5.0  CL 112* 116*  CO2 19* 21*  BUN 77* 75*  CREATININE 6.87* 6.34*  GLUCOSE 78 98  CALCIUM 8.6* 8.9    Recent Labs  04/16/15 1720  INR 1.17    Generally patient is alert and in no apparent distress. Chest is clear to auscultation. Heart exam revealed regular rate and rhythm no murmur no S3 Abdomen: Soft nontender. Patient with suprapubic catheter. Extremities no edema  Assessment/Plan: Problem #1 acute kidney injury: Obstructive. Presently patient is nonoliguric and his renal function is improving. Patient presently oliguric. Patient at this moment does not have any uremic signs and symptoms. Problem #2 chronic renal failure stage IV. The etiology for his chronic renal failure was thought to be secondary to recurrent obstructive uropathy/diabetes/hypertension. Problem #3 hypernatremia: Possibly from lack of free water intake as patient has post obstructive diuresis. Problem #4 history of diabetes Problem #5 anemia: Possibly a common issue of iron deficiency anemia of chronic  disease. His hemoglobin is low but stable Problem #6 bladder neck obstruction: Patient status post suprapubic catheter placement Problem #7 diabetes Plan: We'll start patient on half-normal saline at 135 mL per hour We'll check his basic metabolic panel in the morning.   Square Jowett S 04/19/2015, 8:43 AM

## 2015-04-19 NOTE — Progress Notes (Signed)
Subjective: Patient feels better. He has suprapubic catheter and diuresing well. Nephrology is following him. Objective: Vital signs in last 24 hours: Temp:  [98.4 F (36.9 C)-98.7 F (37.1 C)] 98.7 F (37.1 C) (06/23 0528) Pulse Rate:  [96-99] 96 (06/23 0528) Resp:  [16-20] 16 (06/23 0528) BP: (131-146)/(65-89) 131/65 mmHg (06/23 0528) SpO2:  [100 %] 100 % (06/23 0528) Weight:  [86.728 kg (191 lb 3.2 oz)] 86.728 kg (191 lb 3.2 oz) (06/23 0539) Weight change: -1.905 kg (-4 lb 3.2 oz) Last BM Date: 04/18/15  Intake/Output from previous day: 06/22 0701 - 06/23 0700 In: 0277 [P.O.:1320; I.V.:3] Out: 3550 [Urine:3550]  PHYSICAL EXAM General appearance: alert and no distress Resp: clear to auscultation bilaterally Cardio: S1, S2 normal GI: soft, non-tender; bowel sounds normal; no masses,  no organomegaly Extremities: 2++ leg edema  Lab Results:  Results for orders placed or performed during the hospital encounter of 04/16/15 (from the past 48 hour(s))  Glucose, capillary     Status: Abnormal   Collection Time: 04/17/15  4:45 PM  Result Value Ref Range   Glucose-Capillary 141 (H) 65 - 99 mg/dL  Glucose, capillary     Status: None   Collection Time: 04/17/15  9:20 PM  Result Value Ref Range   Glucose-Capillary 82 65 - 99 mg/dL   Comment 1 Notify RN    Comment 2 Document in Chart   Glucose, capillary     Status: None   Collection Time: 04/18/15  1:37 AM  Result Value Ref Range   Glucose-Capillary 86 65 - 99 mg/dL  Basic metabolic panel     Status: Abnormal   Collection Time: 04/18/15  5:07 AM  Result Value Ref Range   Sodium 147 (H) 135 - 145 mmol/L   Potassium 5.0 3.5 - 5.1 mmol/L   Chloride 116 (H) 101 - 111 mmol/L   CO2 21 (L) 22 - 32 mmol/L   Glucose, Bld 98 65 - 99 mg/dL   BUN 75 (H) 6 - 20 mg/dL   Creatinine, Ser 6.34 (H) 0.61 - 1.24 mg/dL   Calcium 8.9 8.9 - 10.3 mg/dL   GFR calc non Af Amer 8 (L) >60 mL/min   GFR calc Af Amer 9 (L) >60 mL/min    Comment:  (NOTE) The eGFR has been calculated using the CKD EPI equation. This calculation has not been validated in all clinical situations. eGFR's persistently <60 mL/min signify possible Chronic Kidney Disease.    Anion gap 10 5 - 15  CBC     Status: Abnormal   Collection Time: 04/18/15  5:07 AM  Result Value Ref Range   WBC 4.3 4.0 - 10.5 K/uL   RBC 2.67 (L) 4.22 - 5.81 MIL/uL   Hemoglobin 8.0 (L) 13.0 - 17.0 g/dL   HCT 24.3 (L) 39.0 - 52.0 %   MCV 91.0 78.0 - 100.0 fL   MCH 30.0 26.0 - 34.0 pg   MCHC 32.9 30.0 - 36.0 g/dL   RDW 20.1 (H) 11.5 - 15.5 %   Platelets 108 (L) 150 - 400 K/uL    Comment: SPECIMEN CHECKED FOR CLOTS PLATELET COUNT CONFIRMED BY SMEAR   Phosphorus     Status: Abnormal   Collection Time: 04/18/15  5:07 AM  Result Value Ref Range   Phosphorus 6.3 (H) 2.5 - 4.6 mg/dL  Glucose, capillary     Status: Abnormal   Collection Time: 04/18/15  7:16 AM  Result Value Ref Range   Glucose-Capillary 100 (H) 65 - 99  mg/dL   Comment 1 Notify RN    Comment 2 Document in Chart   Glucose, capillary     Status: Abnormal   Collection Time: 04/18/15 11:26 AM  Result Value Ref Range   Glucose-Capillary 142 (H) 65 - 99 mg/dL   Comment 1 Notify RN    Comment 2 Document in Chart   Glucose, capillary     Status: Abnormal   Collection Time: 04/18/15  4:17 PM  Result Value Ref Range   Glucose-Capillary 147 (H) 65 - 99 mg/dL   Comment 1 Notify RN    Comment 2 Document in Chart   Glucose, capillary     Status: Abnormal   Collection Time: 04/18/15  9:12 PM  Result Value Ref Range   Glucose-Capillary 157 (H) 65 - 99 mg/dL   Comment 1 Notify RN    Comment 2 Document in Chart   CBC     Status: Abnormal   Collection Time: 04/19/15  4:56 AM  Result Value Ref Range   WBC 6.6 4.0 - 10.5 K/uL   RBC 2.64 (L) 4.22 - 5.81 MIL/uL   Hemoglobin 8.0 (L) 13.0 - 17.0 g/dL   HCT 24.3 (L) 39.0 - 52.0 %   MCV 92.0 78.0 - 100.0 fL   MCH 30.3 26.0 - 34.0 pg   MCHC 32.9 30.0 - 36.0 g/dL   RDW  20.1 (H) 11.5 - 15.5 %   Platelets 100 (L) 150 - 400 K/uL    Comment: SPECIMEN CHECKED FOR CLOTS CONSISTENT WITH PREVIOUS RESULT     ABGS No results for input(s): PHART, PO2ART, TCO2, HCO3 in the last 72 hours.  Invalid input(s): PCO2 CULTURES Recent Results (from the past 240 hour(s))  MRSA PCR Screening     Status: None   Collection Time: 04/17/15  1:42 AM  Result Value Ref Range Status   MRSA by PCR NEGATIVE NEGATIVE Final    Comment:        The GeneXpert MRSA Assay (FDA approved for NASAL specimens only), is one component of a comprehensive MRSA colonization surveillance program. It is not intended to diagnose MRSA infection nor to guide or monitor treatment for MRSA infections.    Studies/Results: Ir US Guide Bx Asp/drain  04/17/2015   CLINICAL DATA:  Bladder outlet obstruction, urinary retention  EXAM: ULTRASOUND 12 FRENCH SUPRAPUBIC CATHETER INSERTION  Date:  6/21/20166/21/2016 1:44 pm  Radiologist:  M. Daryll Brod, MD  Guidance:  Ultrasound  FLUOROSCOPY TIME:  None.  MEDICATIONS AND MEDICAL HISTORY: 2 mg Versed, 50 mcg fentanyl  ANESTHESIA/SEDATION: 10 minutes  CONTRAST:  None.  COMPLICATIONS: None immediate  PROCEDURE: Informed consent was obtained from the patient following explanation of the procedure, risks, benefits and alternatives. The patient understands, agrees and consents for the procedure. All questions were addressed. A time out was performed.  Maximal barrier sterile technique utilized including caps, mask, sterile gowns, sterile gloves, large sterile drape, hand hygiene, and ChloraPrep.  Preliminary ultrasound performed. Distended bladder localized in the midline of the pelvis. Under sterile conditions and local anesthesia, an 18 gauge introducer needle was advanced under direct ultrasound into the distended bladder. Needle position confirmed with ultrasound. Images obtained for documentation. Amplatz guidewire inserted followed by tract dilatation to advance a  12 [place] drain. Drain catheter confirmed within the bladder with ultrasound. Catheter secured with Prolene suture and connected to external gravity bag. Sterile dressing applied. No immediate complication. Patient tolerated the procedure well.  IMPRESSION: Successful ultrasound 12 French suprapubic bladder catheter insertion.  Electronically Signed   By: Jerilynn Mages.  Shick M.D.   On: 04/17/2015 14:26    Medications: I have reviewed the patient's current medications.  Assesment:   Active Problems:   Acute renal failure   BPH (benign prostatic hypertrophy) with urinary retention   Anemia   Obstructive uropathy   ARF (acute renal failure)    Plan:  Medications reviewed Continue regular treatment BMP today According nephrology recommendation.    LOS: 2 days   Zachary [name] 04/19/2015, 7:53 AM

## 2015-04-20 LAB — PHOSPHORUS: Phosphorus: 4 mg/dL (ref 2.5–4.6)

## 2015-04-20 LAB — FERRITIN: Ferritin: 92 ng/mL (ref 24–336)

## 2015-04-20 LAB — GLUCOSE, CAPILLARY
Glucose-Capillary: 104 mg/dL — ABNORMAL HIGH (ref 65–99)
Glucose-Capillary: 133 mg/dL — ABNORMAL HIGH (ref 65–99)
Glucose-Capillary: 114 mg/dL — ABNORMAL HIGH (ref 65–99)
Glucose-Capillary: 126 mg/dL — ABNORMAL HIGH (ref 65–99)

## 2015-04-20 LAB — BASIC METABOLIC PANEL
Anion gap: 7 (ref 5–15)
BUN: 48 mg/dL — ABNORMAL HIGH (ref 6–20)
CO2: 22 mmol/L (ref 22–32)
Calcium: 8.7 mg/dL — ABNORMAL LOW (ref 8.9–10.3)
Chloride: 115 mmol/L — ABNORMAL HIGH (ref 101–111)
Creatinine, Ser: 4.25 mg/dL — ABNORMAL HIGH (ref 0.61–1.24)
GFR calc Af Amer: 15 mL/min — ABNORMAL LOW (ref >60)
GFR calc non Af Amer: 13 mL/min — ABNORMAL LOW (ref >60)
Glucose, Bld: 89 mg/dL (ref 65–99)
Potassium: 4.5 mmol/L (ref 3.5–5.1)
Sodium: 144 mmol/L (ref 135–145)

## 2015-04-20 LAB — IRON AND TIBC
Iron: 37 ug/dL — ABNORMAL LOW (ref 45–182)
Saturation Ratios: 18 % (ref 17.9–39.5)
TIBC: 204 ug/dL — ABNORMAL LOW (ref 250–450)
UIBC: 167 ug/dL

## 2015-04-20 NOTE — Progress Notes (Signed)
Subjective: Patient is resting. He feels better. His renal function is improving after he was started on IV fluid.. Objective: Vital signs in last 24 hours: Temp:  [98.6 F (37 C)-98.9 F (37.2 C)] 98.9 F (37.2 C) (06/24 0529) Pulse Rate:  [86-97] 97 (06/24 0529) Resp:  [16-18] 18 (06/24 0529) BP: (120-143)/(69-89) 140/69 mmHg (06/24 0529) SpO2:  [99 %-100 %] 99 % (06/24 0529) Weight:  [86.909 kg (191 lb 9.6 oz)] 86.909 kg (191 lb 9.6 oz) (06/24 0529) Weight change: 0.181 kg (6.4 oz) Last BM Date: 04/18/15  Intake/Output from previous day: 06/23 0701 - 06/24 0700 In: 2843.3 [P.O.:600; I.V.:2243.3] Out: 2000 [Urine:2000]  PHYSICAL EXAM General appearance: alert and no distress Resp: clear to auscultation bilaterally Cardio: S1, S2 normal GI: soft, non-tender; bowel sounds normal; no masses,  no organomegaly Extremities: 2++ leg edema  Lab Results:  Results for orders placed or performed during the hospital encounter of 04/16/15 (from the past 48 hour(s))  Glucose, capillary     Status: Abnormal   Collection Time: 04/18/15 11:26 AM  Result Value Ref Range   Glucose-Capillary 142 (H) 65 - 99 mg/dL   Comment 1 Notify RN    Comment 2 Document in Chart   Glucose, capillary     Status: Abnormal   Collection Time: 04/18/15  4:17 PM  Result Value Ref Range   Glucose-Capillary 147 (H) 65 - 99 mg/dL   Comment 1 Notify RN    Comment 2 Document in Chart   Glucose, capillary     Status: Abnormal   Collection Time: 04/18/15  9:12 PM  Result Value Ref Range   Glucose-Capillary 157 (H) 65 - 99 mg/dL   Comment 1 Notify RN    Comment 2 Document in Chart   CBC     Status: Abnormal   Collection Time: 04/19/15  4:56 AM  Result Value Ref Range   WBC 6.6 4.0 - 10.5 K/uL   RBC 2.64 (L) 4.22 - 5.81 MIL/uL   Hemoglobin 8.0 (L) 13.0 - 17.0 g/dL   HCT 24.3 (L) 39.0 - 52.0 %   MCV 92.0 78.0 - 100.0 fL   MCH 30.3 26.0 - 34.0 pg   MCHC 32.9 30.0 - 36.0 g/dL   RDW 20.1 (H) 11.5 - 15.5 %   Platelets 100 (L) 150 - 400 K/uL    Comment: SPECIMEN CHECKED FOR CLOTS CONSISTENT WITH PREVIOUS RESULT   Basic metabolic panel     Status: Abnormal   Collection Time: 04/19/15  4:56 AM  Result Value Ref Range   Sodium 148 (H) 135 - 145 mmol/L   Potassium 4.8 3.5 - 5.1 mmol/L   Chloride 117 (H) 101 - 111 mmol/L   CO2 22 22 - 32 mmol/L   Glucose, Bld 124 (H) 65 - 99 mg/dL   BUN 63 (H) 6 - 20 mg/dL   Creatinine, Ser 5.55 (H) 0.61 - 1.24 mg/dL   Calcium 8.8 (L) 8.9 - 10.3 mg/dL   GFR calc non Af Amer 10 (L) >60 mL/min   GFR calc Af Amer 11 (L) >60 mL/min    Comment: (NOTE) The eGFR has been calculated using the CKD EPI equation. This calculation has not been validated in all clinical situations. eGFR's persistently <60 mL/min signify possible Chronic Kidney Disease.    Anion gap 9 5 - 15  Glucose, capillary     Status: Abnormal   Collection Time: 04/19/15  7:37 AM  Result Value Ref Range   Glucose-Capillary 134 (H)  65 - 99 mg/dL   Comment 1 Notify RN   Glucose, capillary     Status: Abnormal   Collection Time: 04/19/15 11:39 AM  Result Value Ref Range   Glucose-Capillary 158 (H) 65 - 99 mg/dL   Comment 1 Notify RN   Glucose, capillary     Status: None   Collection Time: 04/19/15  4:43 PM  Result Value Ref Range   Glucose-Capillary 90 65 - 99 mg/dL   Comment 1 Notify RN   Glucose, capillary     Status: Abnormal   Collection Time: 04/19/15 10:18 PM  Result Value Ref Range   Glucose-Capillary 119 (H) 65 - 99 mg/dL  Basic metabolic panel     Status: Abnormal   Collection Time: 04/20/15  5:30 AM  Result Value Ref Range   Sodium 144 135 - 145 mmol/L   Potassium 4.5 3.5 - 5.1 mmol/L   Chloride 115 (H) 101 - 111 mmol/L   CO2 22 22 - 32 mmol/L   Glucose, Bld 89 65 - 99 mg/dL   BUN 48 (H) 6 - 20 mg/dL   Creatinine, Ser 4.25 (H) 0.61 - 1.24 mg/dL   Calcium 8.7 (L) 8.9 - 10.3 mg/dL   GFR calc non Af Amer 13 (L) >60 mL/min   GFR calc Af Amer 15 (L) >60 mL/min    Comment:  (NOTE) The eGFR has been calculated using the CKD EPI equation. This calculation has not been validated in all clinical situations. eGFR's persistently <60 mL/min signify possible Chronic Kidney Disease.    Anion gap 7 5 - 15  Phosphorus     Status: None   Collection Time: 04/20/15  5:30 AM  Result Value Ref Range   Phosphorus 4.0 2.5 - 4.6 mg/dL  Glucose, capillary     Status: Abnormal   Collection Time: 04/20/15  7:34 AM  Result Value Ref Range   Glucose-Capillary 104 (H) 65 - 99 mg/dL    ABGS No results for input(s): PHART, PO2ART, TCO2, HCO3 in the last 72 hours.  Invalid input(s): PCO2 CULTURES Recent Results (from the past 240 hour(s))  MRSA PCR Screening     Status: None   Collection Time: 04/17/15  1:42 AM  Result Value Ref Range Status   MRSA by PCR NEGATIVE NEGATIVE Final    Comment:        The GeneXpert MRSA Assay (FDA approved for NASAL specimens only), is one component of a comprehensive MRSA colonization surveillance program. It is not intended to diagnose MRSA infection nor to guide or monitor treatment for MRSA infections.    Studies/Results: No results found.  Medications: I have reviewed the patient's current medications.  Assesment:   Active Problems:   Acute renal failure   BPH (benign prostatic hypertrophy) with urinary retention   Anemia   Obstructive uropathy   ARF (acute renal failure)    Plan:  Medications reviewed Continue regular treatment Continue IV fluid as recommended by nephrology Will monitor BMP.    LOS: 3 days   Gwendolynn Merkey 04/20/2015, 7:51 AM

## 2015-04-20 NOTE — Progress Notes (Signed)
Subjective: Patient overall feels okay. He denies any difficulty breathing. Presently he is asking when he is going to go back to the skilled nursing home.  Objective: Vital signs in last 24 hours: Temp:  [98.6 F (37 C)-98.9 F (37.2 C)] 98.9 F (37.2 C) (06/24 0529) Pulse Rate:  [86-97] 97 (06/24 0529) Resp:  [16-18] 18 (06/24 0529) BP: (120-143)/(69-89) 140/69 mmHg (06/24 0529) SpO2:  [99 %-100 %] 99 % (06/24 0529) Weight:  [191 lb 9.6 oz (86.909 kg)] 191 lb 9.6 oz (86.909 kg) (06/24 0529)  Intake/Output from previous day: 06/23 0701 - 06/24 0700 In: 2843.3 [P.O.:600; I.V.:2243.3] Out: 2000 [Urine:2000] Intake/Output this shift: Total I/O In: 240 [P.O.:240] Out: 300 [Urine:300]   Recent Labs  04/18/15 0507 04/19/15 0456  HGB 8.0* 8.0*    Recent Labs  04/18/15 0507 04/19/15 0456  WBC 4.3 6.6  RBC 2.67* 2.64*  HCT 24.3* 24.3*  PLT 108* 100*    Recent Labs  04/19/15 0456 04/20/15 0530  NA 148* 144  K 4.8 4.5  CL 117* 115*  CO2 22 22  BUN 63* 48*  CREATININE 5.55* 4.25*  GLUCOSE 124* 89  CALCIUM 8.8* 8.7*   No results for input(s): LABPT, INR in the last 72 hours.  Generally patient is alert and in no apparent distress. Chest is clear to auscultation. Heart exam revealed regular rate and rhythm no murmur no S3 Abdomen: Soft nontender. Patient with suprapubic catheter. Extremities no edema  Assessment/Plan: Problem #1 acute kidney injury: Obstructive. His BUN is 48 and creatinine is 4.25. His renal function continued to improve. Presently his creatinine is still about his baseline.. Problem #2 chronic renal failure stage IV. Etiology is due to  obstructive uropathy/diabetes/hypertension. Problem #3 hypernatremia: his sodium is 144 has improved. Problem #4 history of diabetes Problem #5 anemia: Possibly a common issue of iron deficiency anemia of chronic disease. His hemoglobin is low but stable. His iron studies are pending. Problem #6 bladder neck  obstruction: Patient status post suprapubic catheter placement Problem #7 diabetes Problem #8 metabolic bone disease: His calcium and phosphorus is range. Plan:we'll continue to hydrate patient. We'll check his basic metabolic panel in the morning.   Kaegan Stigler S 04/20/2015, 9:17 AM

## 2015-04-20 NOTE — Discharge Instructions (Signed)
Suprapubic Catheter Home Guide A suprapubic catheter is a rubber tube with a tiny balloon on the end. It is used to drain urine from the bladder. This catheter is put in your bladder through a small opening in the lower center part of your abdomen. Suprapubic refers to the area right above your pubic bone. The balloon on the end of the catheter is filled with germ-free (sterile) water. This keeps the catheter from slipping out. When the catheter is in place, your urine will drain into a collection bag. The bag can be put beside your bed at night or attached to your leg during the day. HOW TO CARE FOR YOUR CATHETER  Cleaning your skin  Clean the skin around the catheter opening every day.  Wash your hands with soap and water.  Clean the skin around the opening with a clean washcloth and soapy water. Do not pull on the tube.  Pat the area dry with a clean towel.  Your caregiver may want you to put a bandage (dressing) over the site. Do not use ointment on this area unless your caregiver tells you to. Cleaning the catheter  Ask your caregiver if you need to clean the catheter and how often.  Use only soap and water.  There may be crusts on the catheter. Put hydrogen peroxide on a cotton ball or gauze pad to remove any crust. Emptying the collection bag  You may have a large drainage bag to use at night and a smaller one for daytime. Empty the large bag every 8 hours. Empty the small bag when it is about  full.  Keep the drainage bag below the level of the catheter. This keeps urine from flowing backwards.  Hold the bag over the toilet or another container. Release the valve (spigot) at the bottom of the bag. Do not touch the opening of the spigot. Do not let the opening touch the toilet or container.  Close the spigot tightly when the bag is empty. Cleaning the collection bag  Clean the bag every few days.  First, wash your hands.  Disconnect the tubing from the catheter. Replace  the used bag with a new bag. Then you can clean the used one.  Empty the used bag completely. Rinse it out with warm water and soap or fill the bag with water and add 1 teaspoon of vinegar. Let it sit for about 30 minutes. Then drain.  The bag should be completely dry before storing it. Put it inside a plastic bag to keep it clean. Checking everything  Always make sure there are no kinks in the catheter or tubing.  Always make sure there are no leaks in the catheter, tubing, or collection bag. HOW TO CHANGE YOUR CATHETER Sometimes, a caregiver will change your suprapubic catheter. Other times, you may need to change it yourself. This may be the case if you need to wear a catheter for a long time. Usually, they need to be changed every 4 to 6 weeks. Ask your caregiver how often yours should be changed. Your caregiver will help you order the following supplies for home delivery:  Sterile gloves.  Catheters.  Syringes.  Sterile water.  Sterile cleaning solution.  Lubricant.  Drainage bags. Changing your catheter  Drink plenty of fluids before changing the catheter.  Wash your hands with soap and water.  Lie on your back and put on sterile gloves.  Clean the skin around the catheter opening. Use the sterile cleaning solution.  Use  a syringe to get the water out of the balloon from the old catheter.  Slowly remove the catheter.  Take off the first pair of gloves, and put on a new pair. Then put lubricant on the tip of the new catheter. Put the new catheter through the opening.  Wait for some urine to start flowing. Then, use the other syringe to fill the balloon with sterile water.  Attach the catheter to your drainage bag. Make sure the connection is tight. Important warnings  The catheter should come out easily. If it seems stuck, do not pull it.  Call your caregiver right away if you have any trouble while changing the catheter.  When the old catheter is removed, the  new one should be put in right away. This is because the opening will close quickly. If you have a problem, go to an emergency clinic right away. RISKS AND COMPLICATIONS  Urine flow can become blocked. This can happen if the catheter or tubes are not working right. A blood clot can also block urine flow.  The catheter might irritate tissue in your body. This can cause bleeding.  The skin near the opening for the catheter may become irritated or infected.  Bacteria may get into your bladder. This can cause a urinary tract infection. HOME CARE INSTRUCTIONS  Take all medicines prescribed by your caregiver. Follow the directions carefully.  Drink 8 glasses of water every day. This produces good urine flow.  Check the skin around your catheter a few times every day. Watch for redness and swelling. Look for any fluids coming out of the opening.  Do not use powder or cream around the catheter opening.  Do not take tub baths or use pools or hot tubs.  Keep all follow-up appointments. SEEK MEDICAL CARE IF:  You leak urine.  Your skin around the catheter becomes red or sore.  Your urine flow slows down.  Your urine gets cloudy or smelly. SEEK IMMEDIATE MEDICAL CARE IF:   You have chills, nausea, or back pain.  You have trouble changing your catheter.  Your catheter comes out.  You have blood in your urine.  You have no urine flow for 1 hour.  You have a fever. Document Released: 07/01/2011 Document Revised: 01/05/2012 Document Reviewed: 07/01/2011 [facility] Patient Information 2015 [place], [place]. This information is not intended to replace advice given to you by your health care provider. Make sure you discuss any questions you have with your health care provider.

## 2015-04-20 NOTE — Care Management Note (Signed)
Case Management Note  Patient Details  Name: Zachary [name] MRN: [id number] Date of Birth: Oct 15, 1946   Expected Discharge Date:                  Expected Discharge Plan:  Assisted Living / Rest Home  In-House Referral:  Clinical Social Work  Discharge planning Services  CM Consult  Post Acute Care Choice:  NA Choice offered to:  NA  DME Arranged:    DME Agency:     HH Arranged:    [place] Agency:     Status of Service:  Completed, signed off  Medicare Important Message Given:  Yes Date Medicare IM Given:  04/20/15 Medicare IM give by:  Jolene Provost, RN, MSN, CM  Date Additional Medicare IM Given:    Additional Medicare Important Message give by:     If discussed at [place] of Stay Meetings, dates discussed:    Additional Comments: Anticipate DC over weekend. Pt will return to [facility]. No HH anticipated but if needed will instruct weekend RN to arrange through [facility] per facilities preference. No CM needs at this time.  Sherald Barge, RN 04/20/2015, 9:29 AM

## 2015-04-20 NOTE — Clinical Social Work Note (Signed)
CSW spoke with [name] at [facility] regarding patient possibly discharging this weekend.  [name] advised that if patient were to discharge this weekend, patient's doctor would need to fax prescriptions to Rx care today.  CSW contacted Dr. Josephine Cables office and spoke with [facility].  CSW advised if patient was going to be discharged this weekend, his prescriptions would need to be faxed to Rx care today.  Shekela advised that she would advise Tonia Ghent of this information.    Ihor Gully, [place]

## 2015-04-21 LAB — BASIC METABOLIC PANEL
Anion gap: 5 (ref 5–15)
BUN: 39 mg/dL — ABNORMAL HIGH (ref 6–20)
CO2: 23 mmol/L (ref 22–32)
Calcium: 8.6 mg/dL — ABNORMAL LOW (ref 8.9–10.3)
Chloride: 113 mmol/L — ABNORMAL HIGH (ref 101–111)
Creatinine, Ser: 3.61 mg/dL — ABNORMAL HIGH (ref 0.61–1.24)
GFR calc Af Amer: 19 mL/min — ABNORMAL LOW (ref >60)
GFR calc non Af Amer: 16 mL/min — ABNORMAL LOW (ref >60)
Glucose, Bld: 108 mg/dL — ABNORMAL HIGH (ref 65–99)
Potassium: 4.5 mmol/L (ref 3.5–5.1)
Sodium: 141 mmol/L (ref 135–145)

## 2015-04-21 LAB — TYPE AND SCREEN
ABO/RH(D): B POS
Antibody Screen: NEGATIVE
Unit division: 0
Unit division: 0

## 2015-04-21 LAB — GLUCOSE, CAPILLARY
Glucose-Capillary: 100 mg/dL — ABNORMAL HIGH (ref 65–99)
Glucose-Capillary: 113 mg/dL — ABNORMAL HIGH (ref 65–99)
Glucose-Capillary: 147 mg/dL — ABNORMAL HIGH (ref 65–99)
Glucose-Capillary: 151 mg/dL — ABNORMAL HIGH (ref 65–99)

## 2015-04-21 MED ORDER — POLYSACCHARIDE IRON COMPLEX 150 MG PO CAPS
150.0000 mg | ORAL_CAPSULE | Freq: Two times a day (BID) | ORAL | Status: DC
  Administered 2015-04-21 – 2015-04-23 (×5): 150 mg via ORAL
  Filled 2015-04-21 (×5): qty 1

## 2015-04-21 NOTE — Progress Notes (Signed)
Subjective: Patient offers no new complaints. Presently denies any nausea or vomiting. He denies also any difficulty in breathing.  Objective: Vital signs in last 24 hours: Temp:  [98.2 F (36.8 C)-98.6 F (37 C)] 98.6 F (37 C) (06/25 0505) Pulse Rate:  [83-87] 87 (06/25 0505) Resp:  [18-20] 20 (06/25 0505) BP: (114-170)/(68-86) 149/82 mmHg (06/25 0505) SpO2:  [100 %] 100 % (06/25 0505) Weight:  [198 lb 11.2 oz (90.13 kg)] 198 lb 11.2 oz (90.13 kg) (06/25 0502)  Intake/Output from previous day: 06/24 0701 - 06/25 0700 In: 13305 [P.O.:1200; I.V.:12105] Out: [id number] [Urine:3175] Intake/Output this shift: Total I/O In: 240 [P.O.:240] Out: 400 [Urine:400]   Recent Labs  04/19/15 0456  HGB 8.0*    Recent Labs  04/19/15 0456  WBC 6.6  RBC 2.64*  HCT 24.3*  PLT 100*    Recent Labs  04/20/15 0530 04/21/15 0606  NA 144 141  K 4.5 4.5  CL 115* 113*  CO2 22 23  BUN 48* 39*  CREATININE 4.25* 3.61*  GLUCOSE 89 108*  CALCIUM 8.7* 8.6*   No results for input(s): LABPT, INR in the last 72 hours.  Generally patient is alert and in no apparent distress. Chest is clear to auscultation. Heart exam revealed regular rate and rhythm no murmur no S3 Abdomen: Soft nontender. Patient with suprapubic catheter. Extremities no edema  Assessment/Plan: Problem #1 acute kidney injury: Obstructive. His BUN is 39 and creatinine is 3.69. His renal function continued to improve. He is non-oliguric. Presently he is asymptomatic. Problem #2 chronic renal failure stage IV. Etiology is due to  obstructive uropathy/diabetes/hypertension. Problem #3 hypernatremia: his sodium has corrected Problem #4 history of diabetes Problem #5 anemia: Possibly a common issue of iron deficiency anemia of chronic disease. His hemoglobin is low but stable. His iron saturation and ferritin is better but still low. Problem #6 bladder neck obstruction: Patient status post suprapubic catheter placement Problem #7  diabetes Problem #8 metabolic bone disease: His calcium and phosphorus is range. Plan:we'll DC ferrous sulfate We'll start patient on Nu-Iron 150 mg by mouth twice a day We'll check his basic metabolic panel in the morning.   Lancelot Alyea S 04/21/2015, 11:01 AM

## 2015-04-21 NOTE — Progress Notes (Signed)
Subjective: Patient feels better. His renal function is gradually improving. No fever or chills... Objective: Vital signs in last 24 hours: Temp:  [98.2 F (36.8 C)-98.6 F (37 C)] 98.6 F (37 C) (06/25 0505) Pulse Rate:  [83-87] 87 (06/25 0505) Resp:  [18-20] 20 (06/25 0505) BP: (114-170)/(68-86) 149/82 mmHg (06/25 0505) SpO2:  [100 %] 100 % (06/25 0505) Weight:  [90.13 kg (198 lb 11.2 oz)] 90.13 kg (198 lb 11.2 oz) (06/25 0502) Weight change: 3.221 kg (7 lb 1.6 oz) Last BM Date: 04/18/15  Intake/Output from previous day: 06/24 0701 - 06/25 0700 In: 13305 [P.O.:1200; I.V.:12105] Out: 3175 [Urine:3175]  PHYSICAL EXAM General appearance: alert and no distress Resp: clear to auscultation bilaterally Cardio: S1, S2 normal GI: soft, non-tender; bowel sounds normal; no masses,  no organomegaly Extremities: 2++ leg edema  Lab Results:  Results for orders placed or performed during the hospital encounter of 04/16/15 (from the past 48 hour(s))  Glucose, capillary     Status: Abnormal   Collection Time: 04/19/15 11:39 AM  Result Value Ref Range   Glucose-Capillary 158 (H) 65 - 99 mg/dL   Comment 1 Notify RN   Glucose, capillary     Status: None   Collection Time: 04/19/15  4:43 PM  Result Value Ref Range   Glucose-Capillary 90 65 - 99 mg/dL   Comment 1 Notify RN   Glucose, capillary     Status: Abnormal   Collection Time: 04/19/15 10:18 PM  Result Value Ref Range   Glucose-Capillary 119 (H) 65 - 99 mg/dL  Basic metabolic panel     Status: Abnormal   Collection Time: 04/20/15  5:30 AM  Result Value Ref Range   Sodium 144 135 - 145 mmol/L   Potassium 4.5 3.5 - 5.1 mmol/L   Chloride 115 (H) 101 - 111 mmol/L   CO2 22 22 - 32 mmol/L   Glucose, Bld 89 65 - 99 mg/dL   BUN 48 (H) 6 - 20 mg/dL   Creatinine, Ser 4.25 (H) 0.61 - 1.24 mg/dL   Calcium 8.7 (L) 8.9 - 10.3 mg/dL   GFR calc non Af Amer 13 (L) >60 mL/min   GFR calc Af Amer 15 (L) >60 mL/min    Comment: (NOTE) The eGFR  has been calculated using the CKD EPI equation. This calculation has not been validated in all clinical situations. eGFR's persistently <60 mL/min signify possible Chronic Kidney Disease.    Anion gap 7 5 - 15  Ferritin     Status: None   Collection Time: 04/20/15  5:30 AM  Result Value Ref Range   Ferritin 92 24 - 336 ng/mL    Comment: Performed at [facility]  Iron and TIBC     Status: Abnormal   Collection Time: 04/20/15  5:30 AM  Result Value Ref Range   Iron 37 (L) 45 - 182 ug/dL   TIBC 204 (L) 250 - 450 ug/dL   Saturation Ratios 18 17.9 - 39.5 %   UIBC 167 ug/dL    Comment: Performed at [facility]  Phosphorus     Status: None   Collection Time: 04/20/15  5:30 AM  Result Value Ref Range   Phosphorus 4.0 2.5 - 4.6 mg/dL  Glucose, capillary     Status: Abnormal   Collection Time: 04/20/15  7:34 AM  Result Value Ref Range   Glucose-Capillary 104 (H) 65 - 99 mg/dL  Glucose, capillary     Status: Abnormal   Collection Time: 04/20/15  11:22 AM  Result Value Ref Range   Glucose-Capillary 133 (H) 65 - 99 mg/dL  Glucose, capillary     Status: Abnormal   Collection Time: 04/20/15  4:32 PM  Result Value Ref Range   Glucose-Capillary 114 (H) 65 - 99 mg/dL  Glucose, capillary     Status: Abnormal   Collection Time: 04/20/15  9:02 PM  Result Value Ref Range   Glucose-Capillary 126 (H) 65 - 99 mg/dL   Comment 1 Notify RN    Comment 2 Document in Chart   Basic metabolic panel     Status: Abnormal   Collection Time: 04/21/15  6:06 AM  Result Value Ref Range   Sodium 141 135 - 145 mmol/L   Potassium 4.5 3.5 - 5.1 mmol/L   Chloride 113 (H) 101 - 111 mmol/L   CO2 23 22 - 32 mmol/L   Glucose, Bld 108 (H) 65 - 99 mg/dL   BUN 39 (H) 6 - 20 mg/dL   Creatinine, Ser 3.61 (H) 0.61 - 1.24 mg/dL   Calcium 8.6 (L) 8.9 - 10.3 mg/dL   GFR calc non Af Amer 16 (L) >60 mL/min   GFR calc Af Amer 19 (L) >60 mL/min    Comment: (NOTE) The eGFR has been calculated using the CKD  EPI equation. This calculation has not been validated in all clinical situations. eGFR's persistently <60 mL/min signify possible Chronic Kidney Disease.    Anion gap 5 5 - 15  Glucose, capillary     Status: Abnormal   Collection Time: 04/21/15  7:43 AM  Result Value Ref Range   Glucose-Capillary 100 (H) 65 - 99 mg/dL    ABGS No results for input(s): PHART, PO2ART, TCO2, HCO3 in the last 72 hours.  Invalid input(s): PCO2 CULTURES Recent Results (from the past 240 hour(s))  MRSA PCR Screening     Status: None   Collection Time: 04/17/15  1:42 AM  Result Value Ref Range Status   MRSA by PCR NEGATIVE NEGATIVE Final    Comment:        The GeneXpert MRSA Assay (FDA approved for NASAL specimens only), is one component of a comprehensive MRSA colonization surveillance program. It is not intended to diagnose MRSA infection nor to guide or monitor treatment for MRSA infections.    Studies/Results: No results found.  Medications: I have reviewed the patient's current medications.  Assesment:   Active Problems:   Acute renal failure   BPH (benign prostatic hypertrophy) with urinary retention   Anemia   Obstructive uropathy   ARF (acute renal failure)    Plan:  Medications reviewed Continue regular treatment Continue IV fluid as recommended by nephrology Will monitor BMP.    LOS: 4 days   Liliah Dorian 04/21/2015, 8:50 AM

## 2015-04-22 LAB — GLUCOSE, CAPILLARY
Glucose-Capillary: 103 mg/dL — ABNORMAL HIGH (ref 65–99)
Glucose-Capillary: 157 mg/dL — ABNORMAL HIGH (ref 65–99)
Glucose-Capillary: 132 mg/dL — ABNORMAL HIGH (ref 65–99)
Glucose-Capillary: 101 mg/dL — ABNORMAL HIGH (ref 65–99)

## 2015-04-22 LAB — BASIC METABOLIC PANEL
Anion gap: 6 (ref 5–15)
BUN: 35 mg/dL — ABNORMAL HIGH (ref 6–20)
CO2: 23 mmol/L (ref 22–32)
Calcium: 8.5 mg/dL — ABNORMAL LOW (ref 8.9–10.3)
Chloride: 112 mmol/L — ABNORMAL HIGH (ref 101–111)
Creatinine, Ser: 3.23 mg/dL — ABNORMAL HIGH (ref 0.61–1.24)
GFR calc Af Amer: 21 mL/min — ABNORMAL LOW (ref >60)
GFR calc non Af Amer: 19 mL/min — ABNORMAL LOW (ref >60)
Glucose, Bld: 97 mg/dL (ref 65–99)
Potassium: 4.7 mmol/L (ref 3.5–5.1)
Sodium: 141 mmol/L (ref 135–145)

## 2015-04-22 NOTE — Progress Notes (Signed)
Subjective: Patient is progressively improving. No new complaint. Objective: Vital signs in last 24 hours: Temp:  [98.4 F (36.9 C)-98.8 F (37.1 C)] 98.8 F (37.1 C) (06/26 0523) Pulse Rate:  [88-91] 88 (06/26 0523) Resp:  [18] 18 (06/26 0523) BP: (118-150)/(64-76) 137/64 mmHg (06/26 0523) SpO2:  [100 %] 100 % (06/26 0523) Weight:  [91.173 kg (201 lb)] 91.173 kg (201 lb) (06/26 0523) Weight change: 1.043 kg (2 lb 4.8 oz) Last BM Date: 04/18/15  Intake/Output from previous day: 06/25 0701 - 06/26 0700 In: 4305 [P.O.:1200; I.V.:3105] Out: 2925 [Urine:2925]  PHYSICAL EXAM General appearance: alert and no distress Resp: clear to auscultation bilaterally Cardio: S1, S2 normal GI: soft, non-tender; bowel sounds normal; no masses,  no organomegaly Extremities: 2++ leg edema  Lab Results:  Results for orders placed or performed during the hospital encounter of 04/16/15 (from the past 48 hour(s))  Glucose, capillary     Status: Abnormal   Collection Time: 04/20/15 11:22 AM  Result Value Ref Range   Glucose-Capillary 133 (H) 65 - 99 mg/dL  Glucose, capillary     Status: Abnormal   Collection Time: 04/20/15  4:32 PM  Result Value Ref Range   Glucose-Capillary 114 (H) 65 - 99 mg/dL  Glucose, capillary     Status: Abnormal   Collection Time: 04/20/15  9:02 PM  Result Value Ref Range   Glucose-Capillary 126 (H) 65 - 99 mg/dL   Comment 1 Notify RN    Comment 2 Document in Chart   Basic metabolic panel     Status: Abnormal   Collection Time: 04/21/15  6:06 AM  Result Value Ref Range   Sodium 141 135 - 145 mmol/L   Potassium 4.5 3.5 - 5.1 mmol/L   Chloride 113 (H) 101 - 111 mmol/L   CO2 23 22 - 32 mmol/L   Glucose, Bld 108 (H) 65 - 99 mg/dL   BUN 39 (H) 6 - 20 mg/dL   Creatinine, Ser 3.61 (H) 0.61 - 1.24 mg/dL   Calcium 8.6 (L) 8.9 - 10.3 mg/dL   GFR calc non Af Amer 16 (L) >60 mL/min   GFR calc Af Amer 19 (L) >60 mL/min    Comment: (NOTE) The eGFR has been calculated using  the CKD EPI equation. This calculation has not been validated in all clinical situations. eGFR's persistently <60 mL/min signify possible Chronic Kidney Disease.    Anion gap 5 5 - 15  Glucose, capillary     Status: Abnormal   Collection Time: 04/21/15  7:43 AM  Result Value Ref Range   Glucose-Capillary 100 (H) 65 - 99 mg/dL  Glucose, capillary     Status: Abnormal   Collection Time: 04/21/15 11:35 AM  Result Value Ref Range   Glucose-Capillary 113 (H) 65 - 99 mg/dL  Glucose, capillary     Status: Abnormal   Collection Time: 04/21/15  4:29 PM  Result Value Ref Range   Glucose-Capillary 147 (H) 65 - 99 mg/dL  Glucose, capillary     Status: Abnormal   Collection Time: 04/21/15  8:36 PM  Result Value Ref Range   Glucose-Capillary 151 (H) 65 - 99 mg/dL   Comment 1 Notify RN    Comment 2 Document in Chart   Basic metabolic panel     Status: Abnormal   Collection Time: 04/22/15  6:08 AM  Result Value Ref Range   Sodium 141 135 - 145 mmol/L   Potassium 4.7 3.5 - 5.1 mmol/L   Chloride 112 (H)  101 - 111 mmol/L   CO2 23 22 - 32 mmol/L   Glucose, Bld 97 65 - 99 mg/dL   BUN 35 (H) 6 - 20 mg/dL   Creatinine, Ser 3.23 (H) 0.61 - 1.24 mg/dL   Calcium 8.5 (L) 8.9 - 10.3 mg/dL   GFR calc non Af Amer 19 (L) >60 mL/min   GFR calc Af Amer 21 (L) >60 mL/min    Comment: (NOTE) The eGFR has been calculated using the CKD EPI equation. This calculation has not been validated in all clinical situations. eGFR's persistently <60 mL/min signify possible Chronic Kidney Disease.    Anion gap 6 5 - 15  Glucose, capillary     Status: Abnormal   Collection Time: 04/22/15  7:27 AM  Result Value Ref Range   Glucose-Capillary 103 (H) 65 - 99 mg/dL    ABGS No results for input(s): PHART, PO2ART, TCO2, HCO3 in the last 72 hours.  Invalid input(s): PCO2 CULTURES Recent Results (from the past 240 hour(s))  MRSA PCR Screening     Status: None   Collection Time: 04/17/15  1:42 AM  Result Value Ref  Range Status   MRSA by PCR NEGATIVE NEGATIVE Final    Comment:        The GeneXpert MRSA Assay (FDA approved for NASAL specimens only), is one component of a comprehensive MRSA colonization surveillance program. It is not intended to diagnose MRSA infection nor to guide or monitor treatment for MRSA infections.    Studies/Results: No results found.  Medications: I have reviewed the patient's current medications.  Assesment:   Active Problems:   Acute renal failure   BPH (benign prostatic hypertrophy) with urinary retention   Anemia   Obstructive uropathy   ARF (acute renal failure)    Plan:  Medications reviewed Continue regular treatment Continue IV fluid as recommended by nephrology Will monitor BMP.    LOS: 5 days   Zachary [name] 04/22/2015, 8:49 AM

## 2015-04-22 NOTE — Progress Notes (Signed)
Subjective: No new complaint. His appetite is good and no complaint  Objective: Vital signs in last 24 hours: Temp:  [98.4 F (36.9 C)-98.8 F (37.1 C)] 98.8 F (37.1 C) (06/26 0523) Pulse Rate:  [88-91] 88 (06/26 0523) Resp:  [18] 18 (06/26 0523) BP: (118-150)/(64-76) 137/64 mmHg (06/26 0523) SpO2:  [100 %] 100 % (06/26 0523) Weight:  [201 lb (91.173 kg)] 201 lb (91.173 kg) (06/26 0523)  Intake/Output from previous day: 06/25 0701 - 06/26 0700 In: 4305 [P.O.:1200; I.V.:3105] Out: 2925 [Urine:2925] Intake/Output this shift:    No results for input(s): HGB in the last 72 hours. No results for input(s): WBC, RBC, HCT, PLT in the last 72 hours.  Recent Labs  04/21/15 0606 04/22/15 0608  NA 141 141  K 4.5 4.7  CL 113* 112*  CO2 23 23  BUN 39* 35*  CREATININE 3.61* 3.23*  GLUCOSE 108* 97  CALCIUM 8.6* 8.5*   No results for input(s): LABPT, INR in the last 72 hours.  Generally patient is alert and in no apparent distress. Chest is clear to auscultation. Heart exam revealed regular rate and rhythm no murmur no S3 Abdomen: Soft nontender. Patient with suprapubic catheter. Extremities no edema  Assessment/Plan: Problem #1 acute kidney injury: Obstructive. His BUN is 39 and creatinine is 3.21. His renal function continued to improve. He is non-oliguric. Still his creatinine is higher than his base line. No nausea or vomiting Problem #2 chronic renal failure stage IV. Etiology is due to  obstructive uropathy/diabetes/hypertension. Problem #3 hypernatremia:  Problem #4 history of diabetes Problem #5 anemia: Possibly a common issue of iron deficiency anemia of chronic disease. His hemoglobin is low but stable.  Problem #6 bladder neck obstruction: Patient status post suprapubic catheter placement Problem #7 diabetes Problem #8 metabolic bone disease: His calcium and phosphorus is range. Plan: Continue with present treatment We'll check his basic metabolic panel in the  morning.   Kayzen Kendzierski S 04/22/2015, 8:50 AM

## 2015-04-23 LAB — GLUCOSE, CAPILLARY
Glucose-Capillary: 94 mg/dL (ref 65–99)
Glucose-Capillary: 104 mg/dL — ABNORMAL HIGH (ref 65–99)

## 2015-04-23 LAB — BASIC METABOLIC PANEL
Anion gap: 5 (ref 5–15)
BUN: 34 mg/dL — ABNORMAL HIGH (ref 6–20)
CO2: 23 mmol/L (ref 22–32)
Calcium: 8.3 mg/dL — ABNORMAL LOW (ref 8.9–10.3)
Chloride: 114 mmol/L — ABNORMAL HIGH (ref 101–111)
Creatinine, Ser: 3.14 mg/dL — ABNORMAL HIGH (ref 0.61–1.24)
GFR calc Af Amer: 22 mL/min — ABNORMAL LOW (ref >60)
GFR calc non Af Amer: 19 mL/min — ABNORMAL LOW (ref >60)
Glucose, Bld: 96 mg/dL (ref 65–99)
Potassium: 5 mmol/L (ref 3.5–5.1)
Sodium: 142 mmol/L (ref 135–145)

## 2015-04-23 NOTE — Clinical Social Work Note (Signed)
Pt d/c today back to [facility]. Pt sleeping at time of CSW visit. DSS guardian, [name] and facility aware and agreeable. CM to arrange home health RN. CSW will fax d/c summary and FL2. Facility to provide transport.  Zachary [name], [place]

## 2015-04-23 NOTE — Discharge Summary (Signed)
Physician Discharge Summary  Patient ID: Zachary [name] MRN: [id number] DOB/AGE: [birth year] 69 y.o. Primary Care Physician:Vida Nicol, MD Admit date: 04/16/2015 Discharge date: 04/23/2015    Discharge Diagnoses:   Active Problems:   Acute renal failure   BPH (benign prostatic hypertrophy) with urinary retention   Anemia   Obstructive uropathy   ARF (acute renal failure)     Medication List    STOP taking these medications        torsemide 20 MG tablet  Commonly known as:  DEMADEX      TAKE these medications        carvedilol 6.25 MG tablet  Commonly known as:  COREG  Take 1 tablet (6.25 mg total) by mouth 2 (two) times daily with a meal.     ferrous sulfate 325 (65 FE) MG tablet  Take 325 mg by mouth daily with breakfast.     isosorbide-hydrALAZINE 20-37.5 MG per tablet  Commonly known as:  BIDIL  Take 1 tablet by mouth 3 (three) times daily.     levothyroxine 75 MCG tablet  Commonly known as:  SYNTHROID, LEVOTHROID  Take 75 mcg by mouth daily before breakfast.     linagliptin 5 MG Tabs tablet  Commonly known as:  TRADJENTA  Take 5 mg by mouth daily.     niacin 1000 MG CR tablet  Commonly known as:  NIASPAN  Take 1,000 mg by mouth at bedtime.     simvastatin 20 MG tablet  Commonly known as:  ZOCOR  Take 20 mg by mouth at bedtime.     Vitamin D (Ergocalciferol) 50000 UNITS Caps capsule  Commonly known as:  DRISDOL  Take 1 capsule (50,000 Units total) by mouth every 7 (seven) days.        Discharged Condition: improved     Consults: nephrology and urology  Significant Diagnostic Studies: Dg Chest 2 View  04/16/2015   CLINICAL DATA:  History of renal failure  EXAM: CHEST - 2 VIEW  COMPARISON:  12/25/2013  FINDINGS: The heart size and mediastinal contours are within normal limits. Both lungs are clear. The visualized skeletal structures are unremarkable.  IMPRESSION: No active disease.   Electronically Signed   By: Inez Catalina M.D.   On:  04/16/2015 18:04   US Renal  04/16/2015   CLINICAL DATA:  Bilateral hydronephrosis.  EXAM: RENAL / URINARY TRACT ULTRASOUND COMPLETE  COMPARISON:  03/02/2015  FINDINGS: Right Kidney:  Length: 13.3 cm. Severe hydronephrosis similar to comparison exam. Increased renal cortical echogenicity.  Left Kidney:  Length: 12.7 cm. Severe hydronephrosis with increased cortical echogenicity. No significant change from prior.  Bladder:  The bladder is distended with the large which postvoid residual of 1120 cc disease.  IMPRESSION: 1. No change in bilateral severe hydronephrosis. 2. No change in distended bladder with large postvoid residual. 3.   Electronically Signed   By: Suzy Bouchard M.D.   On: 04/16/2015 09:37   Ir US Guide Bx Asp/drain  04/17/2015   CLINICAL DATA:  Bladder outlet obstruction, urinary retention  EXAM: ULTRASOUND 12 FRENCH SUPRAPUBIC CATHETER INSERTION  Date:  6/21/20166/21/2016 1:44 pm  Radiologist:  M. Daryll Brod, MD  Guidance:  Ultrasound  FLUOROSCOPY TIME:  None.  MEDICATIONS AND MEDICAL HISTORY: 2 mg Versed, 50 mcg fentanyl  ANESTHESIA/SEDATION: 10 minutes  CONTRAST:  None.  COMPLICATIONS: None immediate  PROCEDURE: Informed consent was obtained from the patient following explanation of the procedure, risks, benefits and alternatives. The patient understands, agrees and consents for  the procedure. All questions were addressed. A time out was performed.  Maximal barrier sterile technique utilized including caps, mask, sterile gowns, sterile gloves, large sterile drape, hand hygiene, and ChloraPrep.  Preliminary ultrasound performed. Distended bladder localized in the midline of the pelvis. Under sterile conditions and local anesthesia, an 18 gauge introducer needle was advanced under direct ultrasound into the distended bladder. Needle position confirmed with ultrasound. Images obtained for documentation. Amplatz guidewire inserted followed by tract dilatation to advance a 12 [place] drain.  Drain catheter confirmed within the bladder with ultrasound. Catheter secured with Prolene suture and connected to external gravity bag. Sterile dressing applied. No immediate complication. Patient tolerated the procedure well.  IMPRESSION: Successful ultrasound 12 French suprapubic bladder catheter insertion.   Electronically Signed   By: Jerilynn Mages.  Shick M.D.   On: 04/17/2015 14:26    Lab Results: Basic Metabolic Panel:  Recent Labs  04/22/15 0608 04/23/15 0535  NA 141 142  K 4.7 5.0  CL 112* 114*  CO2 23 23  GLUCOSE 97 96  BUN 35* 34*  CREATININE 3.23* 3.14*  CALCIUM 8.5* 8.3*   Liver Function Tests: No results for input(s): AST, ALT, ALKPHOS, BILITOT, PROT, ALBUMIN in the last 72 hours.   CBC: No results for input(s): WBC, NEUTROABS, HGB, HCT, MCV, PLT in the last 72 hours.  Recent Results (from the past 240 hour(s))  MRSA PCR Screening     Status: None   Collection Time: 04/17/15  1:42 AM  Result Value Ref Range Status   MRSA by PCR NEGATIVE NEGATIVE Final    Comment:        The GeneXpert MRSA Assay (FDA approved for NASAL specimens only), is one component of a comprehensive MRSA colonization surveillance program. It is not intended to diagnose MRSA infection nor to guide or monitor treatment for MRSA infections.      Hospital Course:   This is a 68 years old male with history of multiple medical illnesses was admitted due to worsening renal failure and distended bladder. Patient had bladder out let obstruction. Urologist was unable to insert  Foley catheter. Suprapubic catheter was inserted by IR. Patient drained a lot of urine and her was hydrated. His renal function improved from a Cr. Of 6 to 3. He is being discharged in stable condition.   Discharge Exam: Blood pressure 159/78, pulse 101, temperature 99.2 F (37.3 C), temperature source Oral, resp. rate 20, height 5\' 5"  (1.651 m), weight 90.81 kg (200 lb 3.2 oz), SpO2 95 %.   Disposition:  Assisted  living.        Follow-up Information    Follow up with [facility], MATTHEW, MD.   Specialty:  Urology   Why:  2-3 weeks    Contact information:   [place] [address] [phone]       Follow up with [facility] S, MD In 3 weeks.   Specialty:  Nephrology   Contact information:   [address] [place] [address] [phone]       Follow up with Tine Mabee, MD In 1 week.   Specialty:  Internal Medicine   Contact information:   [place] [address] [phone]       Signed: [name]   04/23/2015, 8:32 AM

## 2015-04-23 NOTE — Progress Notes (Signed)
Subjective: Patient presently does not have any complaints. His appetite is good and no difficulty breathing.  Objective: Vital signs in last 24 hours: Temp:  [98.9 F (37.2 C)-99.2 F (37.3 C)] 99.2 F (37.3 C) (06/27 0555) Pulse Rate:  [95-104] 101 (06/27 0555) Resp:  [20] 20 (06/27 0555) BP: (128-159)/(53-86) 159/78 mmHg (06/27 0555) SpO2:  [95 %-100 %] 95 % (06/27 0555) Weight:  [200 lb 3.2 oz (90.81 kg)] 200 lb 3.2 oz (90.81 kg) (06/27 0554)  Intake/Output from previous day: 06/26 0701 - 06/27 0700 In: 4114.5 [P.O.:960; I.V.:3154.5] Out: 3475 [Urine:3475] Intake/Output this shift:    No results for input(s): HGB in the last 72 hours. No results for input(s): WBC, RBC, HCT, PLT in the last 72 hours.  Recent Labs  04/21/15 0606 04/22/15 0608  NA 141 141  K 4.5 4.7  CL 113* 112*  CO2 23 23  BUN 39* 35*  CREATININE 3.61* 3.23*  GLUCOSE 108* 97  CALCIUM 8.6* 8.5*   No results for input(s): LABPT, INR in the last 72 hours.  Generally patient is alert and in no apparent distress. Chest is clear to auscultation. No rales or wheezing. Heart exam revealed regular rate and rhythm no murmur no S3 Abdomen: Soft none tender and positive bowel sound.. Patient with suprapubic catheter. Extremities no edema  Assessment/Plan: Problem #1 acute kidney injury: Obstructive. Patient with suprapubic catheter and good urine output. His renal function continued to improve. His creatinine is 3.14 .Presently he doesn't have any uremic signs and symptoms. His creatinine however is still slightly higher than his baseline. Problem #2 chronic renal failure stage IV. Etiology is due to  obstructive uropathy/diabetes/hypertension. Problem #3 hypernatremia: Sodium is normal. Problem #4 history of diabetes Problem #5 anemia: Possibly a common issue of iron deficiency anemia of chronic disease. Patient is on oral iron. His hemoglobin is low but stable. Problem #6 bladder neck obstruction: Patient  status post suprapubic catheter placement. Presently he had about 3400 mL of urine output. Problem #7 diabetes Problem #8 metabolic bone disease: His calcium and phosphorus is range. Plan: Continue with present treatment The patient is continuing discharged we'll see him in 3 weeks. We'll check his basic metabolic panel in the morning.   Amran Malter S 04/23/2015, 7:12 AM

## 2015-04-23 NOTE — Care Management (Signed)
Important Message  Patient Details  Name: KALIL BONFANTE MRN: [id number] Date of Birth: [birth year]   Medicare Important Message Given:  Yes-second notification given    Sherald Barge, RN 04/23/2015, 9:35 AM

## 2015-04-23 NOTE — Care Management Note (Signed)
Case Management Note  Patient Details  Name: Zachary [name] MRN: [id number] Date of Birth: [birth year]  Expected Discharge Date:                  Expected Discharge Plan:  Assisted Living / Rest Home  In-House Referral:  Clinical Social Work  Discharge planning Services  CM Consult  Post Acute Care Choice:  NA Choice offered to:  NA  DME Arranged:    DME Agency:     HH Arranged:  RN [place] Agency:  [place]  Status of Service:  Completed, signed off  Medicare Important Message Given:  Yes Date Medicare IM Given:  04/20/15 Medicare IM give by:  Jolene Provost, RN, MSN, CM  Date Additional Medicare IM Given:    Additional Medicare Important Message give by:     If discussed at [place] of Stay Meetings, dates discussed:    Additional Comments: Patient discharging back to [facility] today. Patient ordered [facility] RN for catheter care and education. Romualdo Bolk from [facility], per pt and facility choice, made aware of referral and will obtain pt info from chart. Pt made aware [facility] has 48 hours to make their first visit. No further CM needs at this time. Sherald Barge, RN 04/23/2015, 9:33 AM

## 2015-04-24 DIAGNOSIS — I10 Essential (primary) hypertension: Secondary | ICD-10-CM | POA: Diagnosis not present

## 2015-04-24 DIAGNOSIS — N139 Obstructive and reflux uropathy, unspecified: Secondary | ICD-10-CM | POA: Diagnosis not present

## 2015-04-24 DIAGNOSIS — D649 Anemia, unspecified: Secondary | ICD-10-CM | POA: Diagnosis not present

## 2015-04-24 DIAGNOSIS — E119 Type 2 diabetes mellitus without complications: Secondary | ICD-10-CM | POA: Diagnosis not present

## 2015-04-24 DIAGNOSIS — F79 Unspecified intellectual disabilities: Secondary | ICD-10-CM | POA: Diagnosis not present

## 2015-04-24 DIAGNOSIS — N400 Enlarged prostate without lower urinary tract symptoms: Secondary | ICD-10-CM | POA: Diagnosis not present

## 2015-04-24 DIAGNOSIS — R339 Retention of urine, unspecified: Secondary | ICD-10-CM | POA: Diagnosis not present

## 2015-04-24 DIAGNOSIS — Z466 Encounter for fitting and adjustment of urinary device: Secondary | ICD-10-CM | POA: Diagnosis not present

## 2015-04-25 DIAGNOSIS — N189 Chronic kidney disease, unspecified: Secondary | ICD-10-CM | POA: Diagnosis not present

## 2015-04-25 DIAGNOSIS — N320 Bladder-neck obstruction: Secondary | ICD-10-CM | POA: Diagnosis not present

## 2015-04-25 DIAGNOSIS — E1165 Type 2 diabetes mellitus with hyperglycemia: Secondary | ICD-10-CM | POA: Diagnosis not present

## 2015-04-26 DIAGNOSIS — N400 Enlarged prostate without lower urinary tract symptoms: Secondary | ICD-10-CM | POA: Diagnosis not present

## 2015-04-26 DIAGNOSIS — Z466 Encounter for fitting and adjustment of urinary device: Secondary | ICD-10-CM | POA: Diagnosis not present

## 2015-04-26 DIAGNOSIS — E119 Type 2 diabetes mellitus without complications: Secondary | ICD-10-CM | POA: Diagnosis not present

## 2015-04-26 DIAGNOSIS — R339 Retention of urine, unspecified: Secondary | ICD-10-CM | POA: Diagnosis not present

## 2015-04-26 DIAGNOSIS — N139 Obstructive and reflux uropathy, unspecified: Secondary | ICD-10-CM | POA: Diagnosis not present

## 2015-04-26 DIAGNOSIS — I10 Essential (primary) hypertension: Secondary | ICD-10-CM | POA: Diagnosis not present

## 2015-05-01 DIAGNOSIS — F79 Unspecified intellectual disabilities: Secondary | ICD-10-CM | POA: Diagnosis not present

## 2015-05-01 DIAGNOSIS — E1165 Type 2 diabetes mellitus with hyperglycemia: Secondary | ICD-10-CM | POA: Diagnosis not present

## 2015-05-01 DIAGNOSIS — N184 Chronic kidney disease, stage 4 (severe): Secondary | ICD-10-CM | POA: Diagnosis not present

## 2015-05-01 DIAGNOSIS — N139 Obstructive and reflux uropathy, unspecified: Secondary | ICD-10-CM | POA: Diagnosis not present

## 2015-05-03 DIAGNOSIS — D509 Iron deficiency anemia, unspecified: Secondary | ICD-10-CM | POA: Diagnosis not present

## 2015-05-03 DIAGNOSIS — Z466 Encounter for fitting and adjustment of urinary device: Secondary | ICD-10-CM | POA: Diagnosis not present

## 2015-05-03 DIAGNOSIS — N400 Enlarged prostate without lower urinary tract symptoms: Secondary | ICD-10-CM | POA: Diagnosis not present

## 2015-05-03 DIAGNOSIS — E119 Type 2 diabetes mellitus without complications: Secondary | ICD-10-CM | POA: Diagnosis not present

## 2015-05-03 DIAGNOSIS — I10 Essential (primary) hypertension: Secondary | ICD-10-CM | POA: Diagnosis not present

## 2015-05-03 DIAGNOSIS — R339 Retention of urine, unspecified: Secondary | ICD-10-CM | POA: Diagnosis not present

## 2015-05-03 DIAGNOSIS — N139 Obstructive and reflux uropathy, unspecified: Secondary | ICD-10-CM | POA: Diagnosis not present

## 2015-05-03 DIAGNOSIS — N183 Chronic kidney disease, stage 3 (moderate): Secondary | ICD-10-CM | POA: Diagnosis not present

## 2015-05-04 DIAGNOSIS — N320 Bladder-neck obstruction: Secondary | ICD-10-CM | POA: Diagnosis not present

## 2015-05-04 DIAGNOSIS — N390 Urinary tract infection, site not specified: Secondary | ICD-10-CM | POA: Diagnosis not present

## 2015-05-07 DIAGNOSIS — E875 Hyperkalemia: Secondary | ICD-10-CM | POA: Diagnosis not present

## 2015-05-08 DIAGNOSIS — N179 Acute kidney failure, unspecified: Secondary | ICD-10-CM | POA: Diagnosis not present

## 2015-05-08 DIAGNOSIS — N185 Chronic kidney disease, stage 5: Secondary | ICD-10-CM | POA: Diagnosis not present

## 2015-05-08 DIAGNOSIS — E119 Type 2 diabetes mellitus without complications: Secondary | ICD-10-CM | POA: Diagnosis not present

## 2015-05-08 DIAGNOSIS — N139 Obstructive and reflux uropathy, unspecified: Secondary | ICD-10-CM | POA: Diagnosis not present

## 2015-05-08 DIAGNOSIS — N320 Bladder-neck obstruction: Secondary | ICD-10-CM | POA: Diagnosis not present

## 2015-05-08 DIAGNOSIS — I10 Essential (primary) hypertension: Secondary | ICD-10-CM | POA: Diagnosis not present

## 2015-05-08 DIAGNOSIS — R339 Retention of urine, unspecified: Secondary | ICD-10-CM | POA: Diagnosis not present

## 2015-05-08 DIAGNOSIS — Z466 Encounter for fitting and adjustment of urinary device: Secondary | ICD-10-CM | POA: Diagnosis not present

## 2015-05-08 DIAGNOSIS — D649 Anemia, unspecified: Secondary | ICD-10-CM | POA: Diagnosis not present

## 2015-05-08 DIAGNOSIS — N400 Enlarged prostate without lower urinary tract symptoms: Secondary | ICD-10-CM | POA: Diagnosis not present

## 2015-05-08 DIAGNOSIS — E875 Hyperkalemia: Secondary | ICD-10-CM | POA: Diagnosis not present

## 2015-05-09 ENCOUNTER — Other Ambulatory Visit: Payer: Self-pay | Admitting: Urology

## 2015-05-15 ENCOUNTER — Encounter (HOSPITAL_COMMUNITY): Payer: Self-pay | Admitting: *Deleted

## 2015-05-15 DIAGNOSIS — Z466 Encounter for fitting and adjustment of urinary device: Secondary | ICD-10-CM | POA: Diagnosis not present

## 2015-05-15 DIAGNOSIS — R339 Retention of urine, unspecified: Secondary | ICD-10-CM | POA: Diagnosis not present

## 2015-05-15 DIAGNOSIS — M79675 Pain in left toe(s): Secondary | ICD-10-CM | POA: Diagnosis not present

## 2015-05-15 DIAGNOSIS — I10 Essential (primary) hypertension: Secondary | ICD-10-CM | POA: Diagnosis not present

## 2015-05-15 DIAGNOSIS — B351 Tinea unguium: Secondary | ICD-10-CM | POA: Diagnosis not present

## 2015-05-15 DIAGNOSIS — N400 Enlarged prostate without lower urinary tract symptoms: Secondary | ICD-10-CM | POA: Diagnosis not present

## 2015-05-15 DIAGNOSIS — M79674 Pain in right toe(s): Secondary | ICD-10-CM | POA: Diagnosis not present

## 2015-05-15 DIAGNOSIS — N139 Obstructive and reflux uropathy, unspecified: Secondary | ICD-10-CM | POA: Diagnosis not present

## 2015-05-15 DIAGNOSIS — E119 Type 2 diabetes mellitus without complications: Secondary | ICD-10-CM | POA: Diagnosis not present

## 2015-05-15 NOTE — Progress Notes (Addendum)
                 Your procedure is scheduled on: Tuesday, May 22, 2015  Spoke with Otila Back, First shift supervisior at [facility] [phone] fax [phone].  Interviewed for medical, surgical histories and reviewed the pre op instructions.  Told that the pre op instructions would be faxed after receiving the most recent MAR.  A follow up telephone call to confirm that the instructions were received are clear and if there are any questions.    Report to [place] at AM.0800 To reach Short Stay, please enter the Main Entrance and follow the signs to the [employer] (Event organiser) Take Electronics engineer to [employer].  Exit elevator on Third Floor to enter 3 East.   Call this number if you have problems the morning of surgery: [phone]   Remember: Bring insurance card and picture ID, PLEASE SEND CURRENT  MEDICATION LIST AND TIME LAST MEDICATIONS TAKEN  WITH PATIENT MORNING OF SURGERY.   Do not drink liquids or eat food:After Midnight. Monday night, 05-21-2015    Take these medicines the morning of surgery with A SIP OF WATER:Bidil, Levothyroxine and Carvedilol                                 [place]    Do not wear jewelry, make-up or nail polish.  Do not wear lotions, powders, or perfumes. You may wear deodorant.             Men may shave face and neck.  Do not bring valuables to the hospital.  Contacts, dentures or bridgework may not be worn into surgery.  Leave suitcase in the car. After surgery it may be brought to your room.  For patients admitted to the hospital, checkout time is 11:00 AM the day of discharge.   Patients discharged the day of surgery will not be allowed to drive home.  Name and phone number of your driver: [facility] staff  [phone]                      Call Guadelupe Sabin, RN pre op nurse if needed [phone]   Main Number [phone]             FAILURE TO FOLLOW THESE INSTRUCTIONS MAY RESULT IN THE CANCELLATION OF YOUR SURGERY.     PATIENT   SIGNATURE___________________________________________________

## 2015-05-15 NOTE — Progress Notes (Signed)
Asked Horris Latino first shift supervisor to send a copy of the most recent [facility] for review of medications before the pre op instructions would be send to the facility by fax.  Then I would follow up with a telephone call to confirm that the  pre op instructions were received and clear.

## 2015-05-17 DIAGNOSIS — N139 Obstructive and reflux uropathy, unspecified: Secondary | ICD-10-CM | POA: Diagnosis not present

## 2015-05-21 DIAGNOSIS — N139 Obstructive and reflux uropathy, unspecified: Secondary | ICD-10-CM | POA: Diagnosis not present

## 2015-05-21 DIAGNOSIS — I10 Essential (primary) hypertension: Secondary | ICD-10-CM | POA: Diagnosis not present

## 2015-05-21 DIAGNOSIS — R339 Retention of urine, unspecified: Secondary | ICD-10-CM | POA: Diagnosis not present

## 2015-05-21 DIAGNOSIS — N400 Enlarged prostate without lower urinary tract symptoms: Secondary | ICD-10-CM | POA: Diagnosis not present

## 2015-05-21 DIAGNOSIS — Z466 Encounter for fitting and adjustment of urinary device: Secondary | ICD-10-CM | POA: Diagnosis not present

## 2015-05-21 DIAGNOSIS — E119 Type 2 diabetes mellitus without complications: Secondary | ICD-10-CM | POA: Diagnosis not present

## 2015-05-21 NOTE — H&P (Signed)
History of Present Illness  Consultation for bladder neck contracture referred by Dr. Legrand Rams. Nephrology Dr. Lowanda Foster.       1-bladder neck contracture-June 2016-patient seen as Hospital consult with severe bilateral hydroureteronephrosis and a distended bladder with acute on chronic renal failure. His BUN was up to 75 and creatinine 6.67. He underwent cystoscopy which revealed a dense bladder neck contracture. He then underwent suprapubic tube placement in IR and diuresed liters of urine over the coming days. On discharge his creatinine had decreased to 34 and 3.14 which wasn't quite to his baseline, however the patient has likely been in urinary retention for months.    The patient underwent transurethral resection of the prostate for urinary retention, acute renal failure and bilateral hydronephrosis March 2015 with Dr. Michela Pitcher. He was then seen March 2016 with an ultrasound showing severe bilateral hydroureteronephrosis and a distended bladder and a creatinine in the 3's, but GU was not consulted.     Today,  patient is well. SP draining well. No complaints. AVSS. 1        1 Amended By: Festus Aloe; May 07 2015 3:22 PM EST  Past Medical History Problems  1. History of diabetes mellitus (Z86.39) 2. History of hypercholesterolemia (Z86.39) 3. History of hypertension (Z86.79) 4. History of hypothyroidism (Z86.39) 5. History of mental retardation (Z86.59) 6. History of pyelonephritis (Z87.440) 7. History of Normocytic anemia (D64.9)  Current Meds 1. BiDil TABS;  Therapy: (Recorded:08Jul2016) to Recorded 2. Carvedilol 6.25 MG Oral Tablet;  Therapy: (Recorded:08Jul2016) to Recorded 3. Ferrous Sulfate 325 (65 Fe) MG Oral Tablet;  Therapy: (Recorded:08Jul2016) to Recorded 4. Levothyroxine Sodium 75 MCG Oral Tablet;  Therapy: (Recorded:08Jul2016) to Recorded 5. Niaspan 1000 MG Oral Tablet Extended Release;  Therapy: (Recorded:08Jul2016) to Recorded 6. Simvastatin 20 MG Oral  Tablet;  Therapy: (Recorded:08Jul2016) to Recorded 7. Torsemide 20 MG Oral Tablet;  Therapy: (Recorded:08Jul2016) to Recorded 8. Tradjenta 5 MG Oral Tablet;  Therapy: (Recorded:08Jul2016) to Recorded 9. Vitamin D 50000 UNIT CAPS;  Therapy: (Recorded:08Jul2016) to Recorded  Allergies Medication  1. No Known Drug Allergies  Social History Problems    Denied: History of Alcohol use   Denied: History of Caffeine use   Never a smoker   Number of children   Retired   Single  Review of Systems Genitourinary, constitutional, skin, eye, otolaryngeal, hematologic/lymphatic, cardiovascular, pulmonary, endocrine, musculoskeletal, gastrointestinal, neurological and psychiatric system(s) were reviewed and pertinent findings if present are noted and are otherwise negative.  Constitutional: recent weight loss.  Cardiovascular: leg swelling.    Vitals Vital Signs [Data Includes: Last 1 Day]  Recorded: [id number] 03:49PM  Blood Pressure: 157 / 97 Temperature: 97.4 F Heart Rate: 102  Physical Exam Constitutional: Well nourished and well developed . No acute distress.  ENT:. The ears and nose are normal in appearance.  Neck: The appearance of the neck is normal and no neck mass is present.  Pulmonary: No respiratory distress and normal respiratory rhythm and effort.  Cardiovascular: Heart rate and rhythm are normal . No peripheral edema.  Abdomen: The abdomen is soft and nontender. No masses are palpated. No CVA tenderness. No hernias are palpable. No hepatosplenomegaly noted. The 12 French suprapubic tube in place. Draining clear urine. Granulating well.  Genitourinary: Examination of the penis demonstrates no discharge, no masses, no lesions and a normal meatus. The scrotum is without lesions. The right epididymis is palpably normal and non-tender. The left epididymis is palpably normal and non-tender. The right testis is non-tender and without masses. The left  testis is non-tender and  without masses.  Lymphatics: The femoral and inguinal nodes are not enlarged or tender.  Skin: Normal skin turgor, no visible rash and no visible skin lesions.  Neuro/Psych:. Mood and affect are appropriate.    Results/Data  Old records or history reviewed: epic notes, labs.    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 URINE CULTURE; Status:Hold For - Specimen/Data Collection,Appointment; Requested  for:08Jul2016;  2. Follow-up Schedule Surgery Office  Follow-up  Status: Hold For - Appointment   Requested for: [phone]  Discussion/Summary Bladder neck contracture-I discussed with the patient and his caregiver and the nature risks benefits and alternatives to cystoscopy with transurethral incision of bladder neck which may require balloon dilation, Foley placement, suprapubic tube change. We discussed alternatives such as no treatment and continuing with the suprapubic tube with periodic changes. We discussed risks of bladder neck dilation including incontinence, recurrence of BNC, bleeding and infection among others. All questions answered. He would like to proceed. He says he would like to get rid of the suprapubic tube.      Signatures Electronically signed by : Festus Aloe, M.D.; May 04 2015  4:14PM EST Electronically signed by : Festus Aloe, M.D.; May 07 2015  3:22PM EST

## 2015-05-22 ENCOUNTER — Encounter (HOSPITAL_COMMUNITY)
Admission: RE | Disposition: A | Discharge status: Home / Self Care | Payer: Self-pay | Source: Ambulatory Visit | Attending: Internal Medicine

## 2015-05-22 ENCOUNTER — Inpatient Hospital Stay (HOSPITAL_COMMUNITY)
Admission: RE | Admit: 2015-05-22 | Discharge: 2015-05-25 | DRG: 726 | Disposition: A | Discharge status: Home / Self Care | Payer: Medicare Other | Source: Ambulatory Visit | Attending: Internal Medicine | Admitting: Internal Medicine

## 2015-05-22 ENCOUNTER — Encounter (HOSPITAL_COMMUNITY): Payer: Self-pay | Admitting: Anesthesiology

## 2015-05-22 ENCOUNTER — Ambulatory Visit (HOSPITAL_COMMUNITY): Payer: Medicare Other | Admitting: Anesthesiology

## 2015-05-22 ENCOUNTER — Other Ambulatory Visit: Payer: Self-pay

## 2015-05-22 DIAGNOSIS — E780 Pure hypercholesterolemia: Secondary | ICD-10-CM | POA: Diagnosis present

## 2015-05-22 DIAGNOSIS — D638 Anemia in other chronic diseases classified elsewhere: Secondary | ICD-10-CM | POA: Diagnosis present

## 2015-05-22 DIAGNOSIS — N401 Enlarged prostate with lower urinary tract symptoms: Secondary | ICD-10-CM | POA: Diagnosis not present

## 2015-05-22 DIAGNOSIS — F419 Anxiety disorder, unspecified: Secondary | ICD-10-CM | POA: Diagnosis present

## 2015-05-22 DIAGNOSIS — E785 Hyperlipidemia, unspecified: Secondary | ICD-10-CM | POA: Diagnosis present

## 2015-05-22 DIAGNOSIS — I129 Hypertensive chronic kidney disease with stage 1 through stage 4 chronic kidney disease, or unspecified chronic kidney disease: Secondary | ICD-10-CM | POA: Diagnosis present

## 2015-05-22 DIAGNOSIS — N138 Other obstructive and reflux uropathy: Secondary | ICD-10-CM | POA: Diagnosis present

## 2015-05-22 DIAGNOSIS — R339 Retention of urine, unspecified: Secondary | ICD-10-CM | POA: Diagnosis not present

## 2015-05-22 DIAGNOSIS — N189 Chronic kidney disease, unspecified: Secondary | ICD-10-CM | POA: Diagnosis not present

## 2015-05-22 DIAGNOSIS — N1330 Unspecified hydronephrosis: Secondary | ICD-10-CM | POA: Diagnosis not present

## 2015-05-22 DIAGNOSIS — E1122 Type 2 diabetes mellitus with diabetic chronic kidney disease: Secondary | ICD-10-CM | POA: Diagnosis present

## 2015-05-22 DIAGNOSIS — E875 Hyperkalemia: Secondary | ICD-10-CM | POA: Diagnosis not present

## 2015-05-22 DIAGNOSIS — Z6832 Body mass index (BMI) 32.0-32.9, adult: Secondary | ICD-10-CM | POA: Diagnosis not present

## 2015-05-22 DIAGNOSIS — E1165 Type 2 diabetes mellitus with hyperglycemia: Secondary | ICD-10-CM | POA: Diagnosis not present

## 2015-05-22 DIAGNOSIS — F79 Unspecified intellectual disabilities: Secondary | ICD-10-CM | POA: Diagnosis not present

## 2015-05-22 DIAGNOSIS — E039 Hypothyroidism, unspecified: Secondary | ICD-10-CM | POA: Diagnosis present

## 2015-05-22 DIAGNOSIS — N289 Disorder of kidney and ureter, unspecified: Secondary | ICD-10-CM

## 2015-05-22 DIAGNOSIS — N184 Chronic kidney disease, stage 4 (severe): Secondary | ICD-10-CM | POA: Diagnosis present

## 2015-05-22 DIAGNOSIS — N281 Cyst of kidney, acquired: Secondary | ICD-10-CM | POA: Diagnosis not present

## 2015-05-22 DIAGNOSIS — E669 Obesity, unspecified: Secondary | ICD-10-CM | POA: Diagnosis present

## 2015-05-22 DIAGNOSIS — N320 Bladder-neck obstruction: Secondary | ICD-10-CM | POA: Diagnosis not present

## 2015-05-22 LAB — BASIC METABOLIC PANEL
Anion gap: 6 (ref 5–15)
Anion gap: 3 — ABNORMAL LOW (ref 5–15)
BUN: 50 mg/dL — ABNORMAL HIGH (ref 6–20)
BUN: 41 mg/dL — ABNORMAL HIGH (ref 6–20)
CO2: 19 mmol/L — ABNORMAL LOW (ref 22–32)
CO2: 21 mmol/L — ABNORMAL LOW (ref 22–32)
Calcium: 9.2 mg/dL (ref 8.9–10.3)
Calcium: 8.6 mg/dL — ABNORMAL LOW (ref 8.9–10.3)
Chloride: 116 mmol/L — ABNORMAL HIGH (ref 101–111)
Chloride: 117 mmol/L — ABNORMAL HIGH (ref 101–111)
Creatinine, Ser: 3.39 mg/dL — ABNORMAL HIGH (ref 0.61–1.24)
Creatinine, Ser: 3.24 mg/dL — ABNORMAL HIGH (ref 0.61–1.24)
GFR calc Af Amer: 20 mL/min — ABNORMAL LOW (ref >60)
GFR calc Af Amer: 21 mL/min — ABNORMAL LOW (ref >60)
GFR calc non Af Amer: 17 mL/min — ABNORMAL LOW (ref >60)
GFR calc non Af Amer: 18 mL/min — ABNORMAL LOW (ref >60)
Glucose, Bld: 137 mg/dL — ABNORMAL HIGH (ref 65–99)
Glucose, Bld: 155 mg/dL — ABNORMAL HIGH (ref 65–99)
Potassium: 6.8 mmol/L (ref 3.5–5.1)
Potassium: 5.9 mmol/L — ABNORMAL HIGH (ref 3.5–5.1)
Sodium: 141 mmol/L (ref 135–145)
Sodium: 141 mmol/L (ref 135–145)

## 2015-05-22 LAB — CBC
HCT: 24.6 % — ABNORMAL LOW (ref 39.0–52.0)
HCT: 22.5 % — ABNORMAL LOW (ref 39.0–52.0)
Hemoglobin: 7.8 g/dL — ABNORMAL LOW (ref 13.0–17.0)
Hemoglobin: 7.5 g/dL — ABNORMAL LOW (ref 13.0–17.0)
MCH: 30.8 pg (ref 26.0–34.0)
MCH: 32.1 pg (ref 26.0–34.0)
MCHC: 31.7 g/dL (ref 30.0–36.0)
MCHC: 33.3 g/dL (ref 30.0–36.0)
MCV: 97.2 fL (ref 78.0–100.0)
MCV: 96.2 fL (ref 78.0–100.0)
Platelets: 125 10*3/uL — ABNORMAL LOW (ref 150–400)
Platelets: 120 10*3/uL — ABNORMAL LOW (ref 150–400)
RBC: 2.53 MIL/uL — ABNORMAL LOW (ref 4.22–5.81)
RBC: 2.34 MIL/uL — ABNORMAL LOW (ref 4.22–5.81)
RDW: 20.3 % — ABNORMAL HIGH (ref 11.5–15.5)
RDW: 20.5 % — ABNORMAL HIGH (ref 11.5–15.5)
WBC: 5.8 10*3/uL (ref 4.0–10.5)
WBC: 4.6 10*3/uL (ref 4.0–10.5)

## 2015-05-22 LAB — GLUCOSE, CAPILLARY
Glucose-Capillary: 136 mg/dL — ABNORMAL HIGH (ref 65–99)
Glucose-Capillary: 118 mg/dL — ABNORMAL HIGH (ref 65–99)
Glucose-Capillary: 113 mg/dL — ABNORMAL HIGH (ref 65–99)
Glucose-Capillary: 77 mg/dL (ref 65–99)
Glucose-Capillary: 111 mg/dL — ABNORMAL HIGH (ref 65–99)

## 2015-05-22 LAB — CREATININE, SERUM
Creatinine, Ser: 3.31 mg/dL — ABNORMAL HIGH (ref 0.61–1.24)
GFR calc Af Amer: 21 mL/min — ABNORMAL LOW (ref >60)
GFR calc non Af Amer: 18 mL/min — ABNORMAL LOW (ref >60)

## 2015-05-22 SURGERY — INCISION, BLADDER NECK, TRANSURETHRAL
Anesthesia: General

## 2015-05-22 MED ORDER — CEFAZOLIN SODIUM-DEXTROSE 2-3 GM-% IV SOLR
INTRAVENOUS | Status: AC
  Filled 2015-05-22: qty 50

## 2015-05-22 MED ORDER — SODIUM BICARBONATE 8.4 % IV SOLN
INTRAVENOUS | Status: AC
  Filled 2015-05-22: qty 50

## 2015-05-22 MED ORDER — FENTANYL CITRATE (PF) 100 MCG/2ML IJ SOLN: 25.0000 ug | INTRAMUSCULAR | Status: DC | PRN

## 2015-05-22 MED ORDER — 0.9 % SODIUM CHLORIDE (POUR BTL) OPTIME
TOPICAL | Status: DC | PRN
  Administered 2015-05-22: 1000 mL

## 2015-05-22 MED ORDER — FENTANYL CITRATE (PF) 100 MCG/2ML IJ SOLN
INTRAMUSCULAR | Status: DC | PRN
  Administered 2015-05-22: 50 ug via INTRAVENOUS

## 2015-05-22 MED ORDER — INSULIN ASPART 100 UNIT/ML ~~LOC~~ SOLN
0.0000 [IU] | Freq: Three times a day (TID) | SUBCUTANEOUS | Status: DC
  Administered 2015-05-25: 2 [IU] via SUBCUTANEOUS

## 2015-05-22 MED ORDER — SODIUM POLYSTYRENE SULFONATE 15 GM/60ML PO SUSP
30.0000 g | Freq: Once | ORAL | Status: AC
  Administered 2015-05-22: 30 g via ORAL
  Filled 2015-05-22: qty 120

## 2015-05-22 MED ORDER — SODIUM CHLORIDE 0.9 % IJ SOLN: 3.0000 mL | Freq: Two times a day (BID) | INTRAMUSCULAR | Status: DC

## 2015-05-22 MED ORDER — LACTATED RINGERS IV SOLN
INTRAVENOUS | Status: DC | PRN
  Administered 2015-05-22: 11:00:00 via INTRAVENOUS

## 2015-05-22 MED ORDER — INSULIN ASPART 100 UNIT/ML ~~LOC~~ SOLN
SUBCUTANEOUS | Status: AC
  Filled 2015-05-22: qty 1

## 2015-05-22 MED ORDER — CARVEDILOL 6.25 MG PO TABS
6.2500 mg | ORAL_TABLET | Freq: Two times a day (BID) | ORAL | Status: DC
  Administered 2015-05-22 – 2015-05-25 (×5): 6.25 mg via ORAL
  Filled 2015-05-22: qty 1
  Filled 2015-05-22: qty 2
  Filled 2015-05-22 (×4): qty 1

## 2015-05-22 MED ORDER — ALBUTEROL SULFATE (2.5 MG/3ML) 0.083% IN NEBU
INHALATION_SOLUTION | RESPIRATORY_TRACT | Status: AC
Start: 2015-05-22 — End: 2015-05-23
  Filled 2015-05-22: qty 3

## 2015-05-22 MED ORDER — CEFAZOLIN SODIUM-DEXTROSE 2-3 GM-% IV SOLR: 2.0000 g | INTRAVENOUS | Status: DC

## 2015-05-22 MED ORDER — HEPARIN SODIUM (PORCINE) 5000 UNIT/ML IJ SOLN
5000.0000 [IU] | Freq: Three times a day (TID) | INTRAMUSCULAR | Status: DC
  Administered 2015-05-22 – 2015-05-25 (×6): 5000 [IU] via SUBCUTANEOUS
  Filled 2015-05-22 (×8): qty 1

## 2015-05-22 MED ORDER — SODIUM BICARBONATE 8.4 % IV SOLN
50.0000 meq | Freq: Once | INTRAVENOUS | Status: AC
  Administered 2015-05-22: 50 meq via INTRAVENOUS
  Filled 2015-05-22: qty 50

## 2015-05-22 MED ORDER — LEVOTHYROXINE SODIUM 75 MCG PO TABS
75.0000 ug | ORAL_TABLET | Freq: Every day | ORAL | Status: DC
  Administered 2015-05-23 – 2015-05-25 (×3): 75 ug via ORAL
  Filled 2015-05-22 (×3): qty 1

## 2015-05-22 MED ORDER — DEXTROSE 50 % IV SOLN
1.0000 | Freq: Once | INTRAVENOUS | Status: AC
  Administered 2015-05-22: 50 mL via INTRAVENOUS
  Filled 2015-05-22 (×2): qty 50

## 2015-05-22 MED ORDER — ALBUTEROL SULFATE (2.5 MG/3ML) 0.083% IN NEBU
10.0000 mg | INHALATION_SOLUTION | Freq: Once | RESPIRATORY_TRACT | Status: AC
  Administered 2015-05-22: 10 mg via RESPIRATORY_TRACT

## 2015-05-22 MED ORDER — SODIUM CHLORIDE 3 % IN NEBU
INHALATION_SOLUTION | RESPIRATORY_TRACT | Status: AC
  Filled 2015-05-22: qty 15

## 2015-05-22 MED ORDER — PROMETHAZINE HCL 25 MG/ML IJ SOLN: 6.2500 mg | INTRAMUSCULAR | Status: DC | PRN

## 2015-05-22 MED ORDER — FERROUS SULFATE 325 (65 FE) MG PO TABS
325.0000 mg | ORAL_TABLET | Freq: Every day | ORAL | Status: DC
  Administered 2015-05-23 – 2015-05-25 (×3): 325 mg via ORAL
  Filled 2015-05-22 (×3): qty 1

## 2015-05-22 MED ORDER — DEXTROSE 50 % IV SOLN
INTRAVENOUS | Status: AC
  Filled 2015-05-22: qty 50

## 2015-05-22 MED ORDER — DEXTROSE 50 % IV SOLN
1.0000 | Freq: Once | INTRAVENOUS | Status: AC
  Administered 2015-05-22: 50 mL via INTRAVENOUS

## 2015-05-22 MED ORDER — SIMVASTATIN 20 MG PO TABS
20.0000 mg | ORAL_TABLET | Freq: Every day | ORAL | Status: DC
  Administered 2015-05-22 – 2015-05-24 (×3): 20 mg via ORAL
  Filled 2015-05-22 (×4): qty 1

## 2015-05-22 MED ORDER — ALBUTEROL SULFATE (2.5 MG/3ML) 0.083% IN NEBU
INHALATION_SOLUTION | RESPIRATORY_TRACT | Status: AC
  Filled 2015-05-22: qty 9

## 2015-05-22 MED ORDER — TORSEMIDE 20 MG PO TABS
20.0000 mg | ORAL_TABLET | Freq: Every day | ORAL | Status: DC
  Administered 2015-05-22 – 2015-05-25 (×4): 20 mg via ORAL
  Filled 2015-05-22 (×4): qty 1

## 2015-05-22 MED ORDER — ENSURE ENLIVE PO LIQD: 237.0000 mL | Freq: Two times a day (BID) | ORAL | Status: DC

## 2015-05-22 MED ORDER — INSULIN ASPART 100 UNIT/ML IV SOLN
10.0000 [IU] | Freq: Once | INTRAVENOUS | Status: AC
  Administered 2015-05-22: 10 [IU] via INTRAVENOUS
  Filled 2015-05-22: qty 0.1

## 2015-05-22 MED ORDER — FENTANYL CITRATE (PF) 100 MCG/2ML IJ SOLN
INTRAMUSCULAR | Status: AC
  Filled 2015-05-22: qty 2

## 2015-05-22 MED ORDER — PROPOFOL 10 MG/ML IV BOLUS
INTRAVENOUS | Status: AC
  Filled 2015-05-22: qty 20

## 2015-05-22 MED ORDER — CARVEDILOL 6.25 MG PO TABS
6.2500 mg | ORAL_TABLET | Freq: Two times a day (BID) | ORAL | Status: DC
Start: 2015-05-22 — End: 2015-05-22
  Administered 2015-05-22: 6.25 mg via ORAL
  Filled 2015-05-22: qty 1

## 2015-05-22 MED ORDER — MEPERIDINE HCL 50 MG/ML IJ SOLN: 6.2500 mg | INTRAMUSCULAR | Status: DC | PRN

## 2015-05-22 MED ORDER — INSULIN ASPART 100 UNIT/ML IV SOLN
10.0000 [IU] | Freq: Once | INTRAVENOUS | Status: AC
  Administered 2015-05-22: 10 [IU] via INTRAVENOUS

## 2015-05-22 MED ORDER — ISOSORB DINITRATE-HYDRALAZINE 20-37.5 MG PO TABS
1.0000 | ORAL_TABLET | Freq: Three times a day (TID) | ORAL | Status: DC
  Administered 2015-05-22 – 2015-05-25 (×8): 1 via ORAL
  Filled 2015-05-22 (×9): qty 1

## 2015-05-22 MED ORDER — SODIUM CHLORIDE 0.9 % IR SOLN
Status: DC | PRN
  Administered 2015-05-22: 3000 mL

## 2015-05-22 MED ORDER — SODIUM CHLORIDE 0.9 % IV SOLN
1.0000 g | Freq: Once | INTRAVENOUS | Status: AC
  Administered 2015-05-22: 1 g via INTRAVENOUS
  Filled 2015-05-22: qty 10

## 2015-05-22 MED ORDER — SODIUM CHLORIDE 0.9 % IV SOLN
INTRAVENOUS | Status: DC
  Administered 2015-05-22 – 2015-05-24 (×4): via INTRAVENOUS

## 2015-05-22 SURGICAL SUPPLY — 19 items
BAG URINE DRAINAGE (UROLOGICAL SUPPLIES) ×2 IMPLANT
BAG URO CATCHER STRL LF (DRAPE) ×2 IMPLANT
BLADE SURG 15 STRL LF DISP TIS (BLADE) IMPLANT
BLADE SURG 15 STRL SS (BLADE)
CATH FOLEY 3WAY 30CC 22FR (CATHETERS) ×2 IMPLANT
DRAPE CAMERA CLOSED 9X96 (DRAPES) ×2 IMPLANT
EVACUATOR MICROVAS BLADDER (UROLOGICAL SUPPLIES) ×2 IMPLANT
GLOVE BIOGEL M STRL SZ7.5 (GLOVE) ×2 IMPLANT
GOWN STRL REUS W/TWL LRG LVL3 (GOWN DISPOSABLE) ×2 IMPLANT
GOWN STRL REUS W/TWL XL LVL3 (GOWN DISPOSABLE) ×2 IMPLANT
HOLDER FOLEY CATH W/STRAP (MISCELLANEOUS) IMPLANT
IV NS IRRIG 3000ML ARTHROMATIC (IV SOLUTION) ×8 IMPLANT
KIT ASPIRATION TUBING (SET/KITS/TRAYS/PACK) ×2 IMPLANT
LOOP CUT BIPOLAR 24F LRG (ELECTROSURGICAL) ×2 IMPLANT
MANIFOLD NEPTUNE II (INSTRUMENTS) ×2 IMPLANT
PACK CYSTO (CUSTOM PROCEDURE TRAY) ×2 IMPLANT
SUT ETHILON 3 0 PS 1 (SUTURE) IMPLANT
SYR 30ML LL (SYRINGE) IMPLANT
TUBING CONNECTING 10 (TUBING) ×2 IMPLANT

## 2015-05-22 NOTE — Progress Notes (Signed)
CRITICAL VALUE ALERT  Critical value received:  K+ 5.9  Date of notification:  05/22/15  Time of notification:  1940  Critical value read back:Yes.    Nurse who received alert:  Genelle Bal  MD notified (1st page):  Tylene Fantasia  Time of first page:  1944  MD notified (2nd page):  Time of second page:  Responding MD:  Tylene Fantasia  Time MD responded:  2019

## 2015-05-22 NOTE — Progress Notes (Signed)
Report called to Genelle Bal RN on [place] Room 1.

## 2015-05-22 NOTE — Interval H&P Note (Signed)
History and Physical Interval Note:  05/22/2015 10:40 AM  Zachary [name]  has presented today for surgery, with the diagnosis of BLADDER NECK CONTRACTURE   The various methods of treatment have been discussed with the patient and family. After consideration of risks, benefits and other options for treatment, the patient has consented to  Procedure(s): TRANSURETHRAL INCISION OF BLADDER NECK (N/A) SUPRAPUBIC TUBE CHANGE  (N/A) as a surgical intervention .  [name] history has been reviewed, patient examined, no change in status, stable for surgery.  I have reviewed the patient's chart and labs.  Questions were answered to the patient's satisfaction.  Discussed with patient and caregiver. Pt without complaints. He has been well. We called and confirmed he has been on abx since yesterday.    Zachary [name]

## 2015-05-22 NOTE — Anesthesia Preprocedure Evaluation (Addendum)
Anesthesia Evaluation  Patient identified by MRN, date of birth, ID band Patient awake    Reviewed: Allergy & Precautions, H&P , NPO status , Patient's Chart, lab work & pertinent test results  Airway Mallampati: II  TM Distance: >3 FB Neck ROM: Full    Dental  (+) Poor Dentition, Missing, Dental Advisory Given,    Pulmonary neg pulmonary ROS,    Pulmonary exam normal       Cardiovascular hypertension, Pt. on medications Rhythm:Regular Rate:Normal     Neuro/Psych PSYCHIATRIC DISORDERS (mental retardation)    GI/Hepatic   Endo/Other  diabetes  Renal/GU Renal disease     Musculoskeletal   Abdominal   Peds  Hematology  (+) anemia ,   Anesthesia Other Findings   Reproductive/Obstetrics                           Anesthesia Physical  Anesthesia Plan  ASA: III  Anesthesia Plan: General   Post-op Pain Management:    Induction: Intravenous  Airway Management Planned: Oral ETT  Additional Equipment:   Intra-op Plan:   Post-operative Plan: Extubation in OR  Informed Consent: I have reviewed the patients History and Physical, chart, labs and discussed the procedure including the risks, benefits and alternatives for the proposed anesthesia with the patient or authorized representative who has indicated his/her understanding and acceptance.     Plan Discussed with:   Anesthesia Plan Comments: (Case cancelled. Critical potassium 6.8. Will need optimization via hospitalists prior to surgery)       Anesthesia Quick Evaluation

## 2015-05-22 NOTE — Progress Notes (Signed)
Dr Marcell Barlow  Notified orders received from Hospitalist.  Ordered to follow orders received from hospitalist.  MD aware pt not seen by hospitalist. Pharmacy verified pt may received NOVOLOG insulin IV, as prepared and sent to floor by pharmacy.

## 2015-05-22 NOTE — Progress Notes (Signed)
Order for 10 mg albuterol via nebulizer veirfied by pharmacist, pt to receive 4 packages of 2.5mg  albuterol.

## 2015-05-22 NOTE — Transfer of Care (Signed)
Immediate Anesthesia Transfer of Care Note  Patient: Zachary [name]   Procedure(s) Performed: Procedure(s): TRANSURETHRAL INCISION OF BLADDER NECK (N/A) SUPRAPUBIC TUBE CHANGE  (N/A)  Patient Location: PACU  Anesthesia Type:N/A procedure cancelled   Level of Consciousness: awake, alert , oriented and responds to stimulation  Airway & Oxygen Therapy: Patient Spontanous Breathing  Post-op Assessment: Report given to RN and Post -op Vital signs reviewed and stable  Post vital signs: Reviewed and stable  Last Vitals:  Filed Vitals:   05/22/15 0852  BP: 158/84  Pulse: 97  Temp: 36.7 C  Resp: 16    Complications: No apparent anesthesia complications

## 2015-05-22 NOTE — Progress Notes (Signed)
Appreciate Dr. Jorge Mandril evaluation and management. It's been about 5 weeks since SP tube was placed by IR, so I am going to request SP tube change and upsize by IR.   Pt has a bladder neck contracture from prior TURP done elsewhere. In the long run he needs dilation of bladder neck contracture and foley placement, but an SP tube is adequate drainage for now.

## 2015-05-22 NOTE — Anesthesia Postprocedure Evaluation (Signed)
Case cancelled for Hyperkalemia prior to induction

## 2015-05-22 NOTE — H&P (Signed)
[facility] History and Physical  Zachary [name] [id number] DOB: [birth year] DOA: 05/22/2015  Referring physician: Dr. Junious Silk PCP: [name], MD   Chief Complaint: Hyperkalemia  HPI: Zachary [name] is a 69 y.o. male  With h/o MR, Bladder neck contracture with plans for operation (which had to be halted due to Hyperkalemia) currently with suprapubic catheter, CKD, HTN. He currently has no new complaints and upon preop evaluation was found to have elevated K levels. We were subsequently consulted for further evaluation and recommendations.  K levels at 6.8   Review of Systems:  Constitutional:  No weight loss, night sweats, Fevers, chills, fatigue.  HEENT:  No headaches, Difficulty swallowing,Tooth/dental problems,Sore throat,  No sneezing, itching, ear ache, nasal congestion, post nasal drip,  Cardio-vascular:  No chest pain, Orthopnea, PND, swelling in lower extremities, anasarca, dizziness, palpitations  GI:  No heartburn, indigestion, abdominal pain, nausea, vomiting, diarrhea, change in bowel habits, loss of appetite  Resp:  No shortness of breath with exertion or at rest. No excess mucus, no productive cough, No non-productive cough, No coughing up of blood.No change in color of mucus.No wheezing.No chest wall deformity  Skin:  no rash or lesions.  GU:  no dysuria, change in color of urine, no urgency or frequency. No flank pain.  Musculoskeletal:  No joint pain or swelling. No decreased range of motion. No back pain.  Psych:  No change in mood or affect. No depression or anxiety. No memory loss.   Past Medical History  Diagnosis Date  . Hypertension   . Diabetes mellitus   . Smoking   . Abnormal CT scan, kidney 10/06/2011  . UTI (lower urinary tract infection) 10/06/2011  . Bladder wall thickening 10/06/2011  . 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 Poor historian poor historian  . BPH (benign prostatic hypertrophy)   . Anxiety     mental retardation  .  Anemia   . Hypothyroidism   . MR (mental retardation)   . Acute pyelonephritis 10/07/2011   Past Surgical History  Procedure Laterality Date  . Cystoscopy with urethral dilatation N/A 12/29/2013    Procedure: CYSTOSCOPY WITH URETHRAL DILATATION;  Surgeon: Marissa Nestle, MD;  Location: [facility];  Service: Urology;  Laterality: N/A;  . Transurethral resection of prostate N/A 01/04/2014    Procedure: TRANSURETHRAL RESECTION OF THE PROSTATE (TURP) (procedure #2);  Surgeon: Marissa Nestle, MD;  Location: [facility];  Service: Urology;  Laterality: N/A;  . Circumcision N/A 01/04/2014    Procedure: CIRCUMCISION ADULT (procedure #1);  Surgeon: Marissa Nestle, MD;  Location: [facility];  Service: Urology;  Laterality: N/A;   Social History:  reports that he has never smoked. He has never used smokeless tobacco. He reports that he does not drink alcohol or use illicit drugs.  No Known Allergies  Family History  Problem Relation Age of Onset  . Cancer Mother      Prior to Admission medications   Medication Sig Start Date End Date Taking? Authorizing Provider  carvedilol (COREG) 6.25 MG tablet Take 1 tablet (6.25 mg total) by mouth 2 (two) times daily with a meal. 03/08/15  Yes [name], MD  ferrous sulfate 325 (65 FE) MG tablet Take 325 mg by mouth daily with breakfast.   Yes Historical Provider, MD  isosorbide-hydrALAZINE (BIDIL) 20-37.5 MG per tablet Take 1 tablet by mouth 3 (three) times daily. 03/08/15  Yes [name], MD  levothyroxine (SYNTHROID, LEVOTHROID) 75 MCG tablet Take 75 mcg by mouth daily before breakfast.  Yes Historical Provider, MD  linagliptin (TRADJENTA) 5 MG TABS tablet Take 5 mg by mouth daily.   Yes Historical Provider, MD  niacin (NIASPAN) 1000 MG CR tablet Take 1,000 mg by mouth at bedtime.   Yes Historical Provider, MD  simvastatin (ZOCOR) 20 MG tablet Take 20 mg by mouth at bedtime.  02/28/15  Yes Historical Provider, MD  sulfamethoxazole-trimethoprim (BACTRIM  DS,SEPTRA DS) 800-160 MG per tablet Take 1 tablet by mouth 2 (two) times daily.   Yes Historical Provider, MD  torsemide (DEMADEX) 20 MG tablet Take 20 mg by mouth daily.   Yes Historical Provider, MD  Vitamin D, Ergocalciferol, (DRISDOL) 50000 UNITS CAPS capsule Take 1 capsule (50,000 Units total) by mouth every 7 (seven) days. Patient taking differently: Take 50,000 Units by mouth every 7 (seven) days. On Mondays 03/08/15  Yes [name], MD   Physical Exam: Filed Vitals:   05/22/15 1215 05/22/15 1230 05/22/15 1245 05/22/15 1300  BP: 171/83 142/76 143/78 152/82  Pulse: 90 89 82 92  Temp:      TempSrc:      Resp: 16 14 14 17   Height:      Weight:      SpO2: 100% 98% 100% 100%    Wt Readings from Last 3 Encounters:  05/22/15 89.869 kg (198 lb 2 oz)  04/23/15 90.81 kg (200 lb 3.2 oz)  04/10/15 98.431 kg (217 lb)    General:  Appears calm and comfortable Eyes: PERRL, normal lids, irises & conjunctiva ENT: grossly normal hearing, lips & tongue Neck: no LAD, masses or thyromegaly Cardiovascular: RRR, no m/r/g. No LE edema. Respiratory: CTA bilaterally, no w/r/r. Normal respiratory effort. Abdomen: soft, nt, nd GU: Has suprapubic catheter in place. Skin: no rash or induration seen on limited exam Musculoskeletal: grossly normal tone BUE/BLE Psychiatric: grossly normal mood and affect, speech fluent and appropriate Neurologic: grossly non-focal.          Labs on Admission:  Basic Metabolic Panel:  Recent Labs Lab 05/22/15 0942  NA 141  K 6.8*  CL 116*  CO2 19*  GLUCOSE 137*  BUN 50*  CREATININE 3.39*  CALCIUM 9.2   Liver Function Tests: No results for input(s): AST, ALT, ALKPHOS, BILITOT, PROT, ALBUMIN in the last 168 hours. No results for input(s): LIPASE, AMYLASE in the last 168 hours. No results for input(s): AMMONIA in the last 168 hours. CBC:  Recent Labs Lab 05/22/15 0942  WBC 5.8  HGB 7.8*  HCT 24.6*  MCV 97.2  PLT 125*   Cardiac Enzymes: No  results for input(s): CKTOTAL, CKMB, CKMBINDEX, TROPONINI in the last 168 hours.  BNP (last 3 results) No results for input(s): BNP in the last 8760 hours.  ProBNP (last 3 results) No results for input(s): PROBNP in the last 8760 hours.  CBG:  Recent Labs Lab 05/22/15 0928 05/22/15 1115 05/22/15 1302  GLUCAP 136* 118* 113*    Radiological Exams on Admission: No results found.   Assessment/Plan    Hyperkalemia - D50 and insulin novolog 10 units - sodium bicarb administration - Kayexalate administration - albuterol - telemetry monitoring. Transferring to [facility] medical management dose not improve hyperkalemia patient may require dialysis  Active Problems:   DM (diabetes mellitus), type 2, uncontrolled - SSI - diabetic diet    Renal insufficiency - most likely due to problem listed below. With improvement in K levels urology can perform operation on bladder neck contracture. Suprapubic catheter in place.    BPH (benign prostatic  hypertrophy) and bladder neck contracture with urinary obstruction - Urology was planning on operating patient for bladder neck contracture but was unable to due to hyperkalemia. With improvement in K levels Urology can consider   Code Status: full DVT Prophylaxis: Hepari Family Communication:  None at bedside Disposition Plan: Pending improvement in condition, transfer to telemetry on Nephro floor  Time spent: > 55 minutes  Velvet Bathe [facility] Pager [phone]

## 2015-05-22 NOTE — Progress Notes (Signed)
Patient transported by [facility] to [facility]. VSS. SR on the monitor. No c/o pain. Suprapubic catheter in place draining yellow clear urine.

## 2015-05-22 NOTE — Progress Notes (Signed)
I cannot do patients ELECTIVE OUTPATIENT procedure today as he has hyperkalemia. We called [facility] for evaluation and admission.

## 2015-05-22 NOTE — Progress Notes (Signed)
Patient cognitively unable to sign [employer] Forms/Medicare Forms. Guardian is Education officer, museum in [place], is not here with patient

## 2015-05-23 DIAGNOSIS — F79 Unspecified intellectual disabilities: Secondary | ICD-10-CM

## 2015-05-23 DIAGNOSIS — N289 Disorder of kidney and ureter, unspecified: Secondary | ICD-10-CM

## 2015-05-23 DIAGNOSIS — D638 Anemia in other chronic diseases classified elsewhere: Secondary | ICD-10-CM

## 2015-05-23 DIAGNOSIS — E1165 Type 2 diabetes mellitus with hyperglycemia: Secondary | ICD-10-CM

## 2015-05-23 DIAGNOSIS — E875 Hyperkalemia: Secondary | ICD-10-CM

## 2015-05-23 DIAGNOSIS — N401 Enlarged prostate with lower urinary tract symptoms: Principal | ICD-10-CM

## 2015-05-23 LAB — BASIC METABOLIC PANEL
Anion gap: 5 (ref 5–15)
BUN: 39 mg/dL — ABNORMAL HIGH (ref 6–20)
CO2: 20 mmol/L — ABNORMAL LOW (ref 22–32)
Calcium: 8.9 mg/dL (ref 8.9–10.3)
Chloride: 117 mmol/L — ABNORMAL HIGH (ref 101–111)
Creatinine, Ser: 3.32 mg/dL — ABNORMAL HIGH (ref 0.61–1.24)
GFR calc Af Amer: 21 mL/min — ABNORMAL LOW (ref >60)
GFR calc non Af Amer: 18 mL/min — ABNORMAL LOW (ref >60)
Glucose, Bld: 89 mg/dL (ref 65–99)
Potassium: 5.7 mmol/L — ABNORMAL HIGH (ref 3.5–5.1)
Sodium: 142 mmol/L (ref 135–145)

## 2015-05-23 LAB — GLUCOSE, CAPILLARY
Glucose-Capillary: 87 mg/dL (ref 65–99)
Glucose-Capillary: 88 mg/dL (ref 65–99)
Glucose-Capillary: 63 mg/dL — ABNORMAL LOW (ref 65–99)
Glucose-Capillary: 87 mg/dL (ref 65–99)
Glucose-Capillary: 138 mg/dL — ABNORMAL HIGH (ref 65–99)

## 2015-05-23 LAB — CBC
HCT: 22.2 % — ABNORMAL LOW (ref 39.0–52.0)
Hemoglobin: 7.4 g/dL — ABNORMAL LOW (ref 13.0–17.0)
MCH: 32.3 pg (ref 26.0–34.0)
MCHC: 33.3 g/dL (ref 30.0–36.0)
MCV: 96.9 fL (ref 78.0–100.0)
Platelets: 112 10*3/uL — ABNORMAL LOW (ref 150–400)
RBC: 2.29 MIL/uL — ABNORMAL LOW (ref 4.22–5.81)
RDW: 20.4 % — ABNORMAL HIGH (ref 11.5–15.5)
WBC: 4.1 10*3/uL (ref 4.0–10.5)

## 2015-05-23 LAB — MRSA PCR SCREENING: MRSA by PCR: NEGATIVE

## 2015-05-23 LAB — POTASSIUM
Potassium: 5.5 mmol/L — ABNORMAL HIGH (ref 3.5–5.1)
Potassium: 5.2 mmol/L — ABNORMAL HIGH (ref 3.5–5.1)

## 2015-05-23 MED ORDER — INSULIN ASPART 100 UNIT/ML IV SOLN
10.0000 [IU] | Freq: Once | INTRAVENOUS | Status: AC
  Administered 2015-05-23: 10 [IU] via INTRAVENOUS

## 2015-05-23 MED ORDER — SODIUM POLYSTYRENE SULFONATE 15 GM/60ML PO SUSP: 30.0000 g | Freq: Once | ORAL | Status: DC

## 2015-05-23 MED ORDER — NEPRO/CARBSTEADY PO LIQD: 237.0000 mL | Freq: Every day | ORAL | Status: DC

## 2015-05-23 MED ORDER — SODIUM POLYSTYRENE SULFONATE 15 GM/60ML PO SUSP
30.0000 g | Freq: Once | ORAL | Status: AC
  Administered 2015-05-23: 30 g via RECTAL
  Filled 2015-05-23: qty 120

## 2015-05-23 MED ORDER — DEXTROSE 50 % IV SOLN
1.0000 | Freq: Once | INTRAVENOUS | Status: AC
  Administered 2015-05-23: 50 mL via INTRAVENOUS
  Filled 2015-05-23: qty 50

## 2015-05-23 MED ORDER — SODIUM POLYSTYRENE SULFONATE 15 GM/60ML PO SUSP
15.0000 g | Freq: Once | ORAL | Status: AC
  Administered 2015-05-23: 15 g via ORAL
  Filled 2015-05-23: qty 60

## 2015-05-23 NOTE — Progress Notes (Signed)
Unit CM UR Completed by [facility] ED CM  W. Otillia Cordone RN  

## 2015-05-23 NOTE — Progress Notes (Signed)
Initial Nutrition Assessment  DOCUMENTATION CODES:   Obesity unspecified  INTERVENTION:   Once diet advances, provide Nepro Shake po once daily, each supplement provides 425 kcal and 19 grams protein.  NUTRITION DIAGNOSIS:   Increased nutrient needs related to  (Illness) as evidenced by estimated needs.  GOAL:   Patient will meet greater than or equal to 90% of their needs  MONITOR:   Diet advancement, Weight trends, Labs, I & O's  REASON FOR ASSESSMENT:   Malnutrition Screening Tool    ASSESSMENT:   With h/o MR, Bladder neck contracture with plans for operation (which had to be halted due to Hyperkalemia) currently with suprapubic catheter,DM, CKD, HTN. He currently has no new complaints and upon preop evaluation was found to have elevated K levels.  Pt reports having a good appetite currently and PTA with 3 meals a day with no other difficulties. Pt is currently NPO. Pt reports usual body weight of 208 lbs. Pt currently has Ensure ordered. RD to modify to Nepro shake as pt with elevated potassium. Pt with no observed significant fat or muscle mass loss.   Elevated Potassium 5.7.   Diet Order:  Diet NPO time specified  Skin:  Reviewed, no issues  Last BM:  7/26  Height:   Ht Readings from Last 1 Encounters:  05/22/15 5\' 7"  (1.702 m)    Weight:   Wt Readings from Last 1 Encounters:  05/22/15 208 lb 6.4 oz (94.53 kg)   Wt Readings from Last 50 Encounters:  05/22/15 208 lb 6.4 oz (94.53 kg)  04/23/15 200 lb 3.2 oz (90.81 kg)  04/10/15 217 lb (98.431 kg)  03/28/15 217 lb (98.431 kg)  03/02/15 197 lb (89.359 kg)  12/29/13 197 lb 12 oz (89.7 kg)  10/04/11 189 lb 9.5 oz (86 kg)  09/30/11 187 lb 8 oz (85.049 kg)    Ideal Body Weight:  67 kg  BMI:  Body mass index is 32.63 kg/(m^2).  Estimated Nutritional Needs:   Kcal:  1900-2100  Protein:  95-110 grams  Fluid:  1.9 - 2.1 L/day  EDUCATION NEEDS:   No education needs identified at this  time  Corrin Parker, MS, RD, LDN Pager # [phone] After hours/ weekend pager # [phone]

## 2015-05-23 NOTE — Progress Notes (Signed)
05/23/2015 6:50 PM Bladder neck contracture procedure cancelled, has been rescheduled for tomorrow. Pt received dinner tray and will be NPO at midnight.  Theodosia Paling, RN

## 2015-05-23 NOTE — Progress Notes (Signed)
05/23/2015 Hypoglycemic Event  CBG: 63  Treatment: patient received dinner  Symptoms: no symptoms  Follow-up CBG: Time:1748 CBG Result: 88  Possible Reasons for Event: patient was NPO for procedure but got pushed back to tomorrow 7/28  Comments/MD notified: MD notified    Theodosia Paling, RN  Remember to initiate Hypoglycemia Order Set & complete

## 2015-05-23 NOTE — Progress Notes (Signed)
[facility]                                                                              Patient Demographics  Zachary [name], is a 69 y.o. male, DOB - 1945-11-07, [id number]  Admit date - 05/22/2015   Admitting Physician Velvet Bathe, MD  Outpatient Primary MD for the patient is FANTA,TESFAYE, MD  LOS - 1   No chief complaint on file.     HPI on 05/22/2015 by Dr. Velvet Bathe Zachary [name] is a 69 y.o. male with h/o MR, Bladder neck contracture with plans for operation (which had to be halted due to Hyperkalemia) currently with suprapubic catheter, CKD, HTN. He currently has no new complaints and upon preop evaluation was found to have elevated K levels. We were subsequently consulted for further evaluation and recommendations. K levels at 6.8  Assessment & Plan   Hyperkalemia -Was 6.8 upon admission -Currently 5.7, will continue Kayexalate rectally, insulin followed by D50 -Likely secondary to urinary obstruction -We'll continue to monitor BMP  BPH/Bladder neck contracture with urinary obstruction -Urology consulted and appreciated (patient had suprapubic catheter placed 5 weeks ago) he -Possible SP tube change (upsize by IR today) vs dilation of bladder neck -Pending further recommendations from urology  Diabetes mellitus, type 2 -Tradjenta held, continue insulin sliding scale and CABG monitoring  Chronic kidney disease, stage IV -Creatinine appears to be close to baseline -Cr 3.32, GFR 21 -Continue to monitor BMP  Anemia of chronic disease -Continue iron supplementation -Baseline hemoglobin appears to be 8 -Currently 7.4, will continue to monitor CBC and order blood transfusion if hemoglobin drops below 7  Hypothyroidism -Continue Synthroid  Hypertension -Continue Coreg and Demadex  Hyperlipidemia -Continue statin  Code Status: Full   Family Communication: None at bedside  Disposition Plan: Admitted. Pending surgery.   Time Spent in minutes   30  minutes  Procedures  None  Consults   Urology, Dr. Duaine Dredge  DVT Prophylaxis  heparin  Lab Results  Component Value Date   PLT 112* 05/23/2015    Medications  Scheduled Meds: . carvedilol  6.25 mg Oral BID WC  . feeding supplement (ENSURE ENLIVE)  237 mL Oral BID BM  . ferrous sulfate  325 mg Oral Q breakfast  . heparin  5,000 Units Subcutaneous 3 times per day  . insulin aspart  0-15 Units Subcutaneous TID WC  . isosorbide-hydrALAZINE  1 tablet Oral TID  . levothyroxine  75 mcg Oral QAC breakfast  . simvastatin  20 mg Oral QHS  . sodium chloride  3 mL Intravenous Q12H  . torsemide  20 mg Oral Daily   Continuous Infusions: . sodium chloride 125 mL/hr at 05/23/15 1131   PRN Meds:.  Antibiotics    Anti-infectives    Start     Dose/Rate Route Frequency Ordered Stop   05/22/15 0919  ceFAZolin (ANCEF) IVPB 2 g/50 mL premix  Status:  Discontinued     2 g 100 mL/hr over 30 Minutes Intravenous 30 min pre-op 05/22/15 0919 05/22/15 1403      Subjective:   Zachary [name] seen and examined today.  Patient has mental retardation.  Cannot answer many questions.  Denies  pain at this time.    Objective:   Filed Vitals:   05/22/15 1641 05/22/15 2127 05/23/15 0430 05/23/15 0843  BP: 159/90 129/71 124/70 134/70  Pulse: 95 96 100 104  Temp: 97.8 F (36.6 C) 99.1 F (37.3 C) 98.5 F (36.9 C) 98.8 F (37.1 C)  TempSrc: Axillary Oral Oral Oral  Resp: 15 16 15 16   Height:      Weight:  94.53 kg (208 lb 6.4 oz)    SpO2: 100% 100% 99% 99%    Wt Readings from Last 3 Encounters:  05/22/15 94.53 kg (208 lb 6.4 oz)  04/23/15 90.81 kg (200 lb 3.2 oz)  04/10/15 98.431 kg (217 lb)     Intake/Output Summary (Last 24 hours) at 05/23/15 1208 Last data filed at 05/23/15 1016  Gross per 24 hour  Intake    240 ml  Output   3060 ml  Net  -2820 ml    Exam  General: Well developed, well nourished, NAD, appears stated age  54: NCAT,mucous membranes moist.   Cardiovascular:  S1 S2 auscultated, no rubs, murmurs or gallops. Regular rate and rhythm.  Respiratory: Clear to auscultation bilaterally with equal chest rise  Abdomen: Soft, nontender, nondistended, + bowel sounds, has suprapubic cath  Extremities: warm dry without cyanosis clubbing or edema  Neuro: Awake, alert, oriented to only self (does not know his birth date)  Data Review   Micro Results Recent Results (from the past 240 hour(s))  MRSA PCR Screening     Status: None   Collection Time: 05/23/15  8:50 AM  Result Value Ref Range Status   MRSA by PCR NEGATIVE NEGATIVE Final    Comment:        The GeneXpert MRSA Assay (FDA approved for NASAL specimens only), is one component of a comprehensive MRSA colonization surveillance program. It is not intended to diagnose MRSA infection nor to guide or monitor treatment for MRSA infections.     Radiology Reports No results found.  CBC  Recent Labs Lab 05/22/15 0942 05/22/15 1501 05/23/15 0519  WBC 5.8 4.6 4.1  HGB 7.8* 7.5* 7.4*  HCT 24.6* 22.5* 22.2*  PLT 125* 120* 112*  MCV 97.2 96.2 96.9  MCH 30.8 32.1 32.3  MCHC 31.7 33.3 33.3  RDW 20.3* 20.5* 20.4*    Chemistries   Recent Labs Lab 05/22/15 0942 05/22/15 1501 05/22/15 1840 05/23/15 0002 05/23/15 0519  NA 141  --  141  --  142  K 6.8*  --  5.9* 5.5* 5.7*  CL 116*  --  117*  --  117*  CO2 19*  --  21*  --  20*  GLUCOSE 137*  --  155*  --  89  BUN 50*  --  41*  --  39*  CREATININE 3.39* 3.31* 3.24*  --  3.32*  CALCIUM 9.2  --  8.6*  --  8.9   ------------------------------------------------------------------------------------------------------------------ estimated creatinine clearance is 24 mL/min (by C-G formula based on Cr of 3.32). ------------------------------------------------------------------------------------------------------------------ No results for input(s): HGBA1C in the last 72  hours. ------------------------------------------------------------------------------------------------------------------ No results for input(s): CHOL, HDL, LDLCALC, TRIG, CHOLHDL, LDLDIRECT in the last 72 hours. ------------------------------------------------------------------------------------------------------------------ No results for input(s): TSH, T4TOTAL, T3FREE, THYROIDAB in the last 72 hours.  Invalid input(s): FREET3 ------------------------------------------------------------------------------------------------------------------ No results for input(s): VITAMINB12, FOLATE, FERRITIN, TIBC, IRON, RETICCTPCT in the last 72 hours.  Coagulation profile No results for input(s): INR, PROTIME in the last 168 hours.  No results for input(s): DDIMER in the last 72 hours.  Cardiac Enzymes No results for input(s): CKMB, TROPONINI, MYOGLOBIN in the last 168 hours.  Invalid input(s): CK ------------------------------------------------------------------------------------------------------------------ Invalid input(s): POCBNP    Raygan Skarda D.O. on 05/23/2015 at 12:08 PM  Between 7am to 7pm - Pager - [phone]  After 7pm go to [email or web address] - password [email or web address]  And look for the night coverage person covering for me after hours  [facility] Office  [phone]

## 2015-05-24 ENCOUNTER — Inpatient Hospital Stay (HOSPITAL_COMMUNITY): Payer: Medicare Other

## 2015-05-24 LAB — BASIC METABOLIC PANEL
Anion gap: 7 (ref 5–15)
BUN: 34 mg/dL — ABNORMAL HIGH (ref 6–20)
CO2: 17 mmol/L — ABNORMAL LOW (ref 22–32)
Calcium: 8.4 mg/dL — ABNORMAL LOW (ref 8.9–10.3)
Chloride: 119 mmol/L — ABNORMAL HIGH (ref 101–111)
Creatinine, Ser: 3.17 mg/dL — ABNORMAL HIGH (ref 0.61–1.24)
GFR calc Af Amer: 22 mL/min — ABNORMAL LOW (ref >60)
GFR calc non Af Amer: 19 mL/min — ABNORMAL LOW (ref >60)
Glucose, Bld: 99 mg/dL (ref 65–99)
Potassium: 4.9 mmol/L (ref 3.5–5.1)
Sodium: 143 mmol/L (ref 135–145)

## 2015-05-24 LAB — CBC
HCT: 21.9 % — ABNORMAL LOW (ref 39.0–52.0)
Hemoglobin: 7.2 g/dL — ABNORMAL LOW (ref 13.0–17.0)
MCH: 32 pg (ref 26.0–34.0)
MCHC: 32.9 g/dL (ref 30.0–36.0)
MCV: 97.3 fL (ref 78.0–100.0)
Platelets: 113 10*3/uL — ABNORMAL LOW (ref 150–400)
RBC: 2.25 MIL/uL — ABNORMAL LOW (ref 4.22–5.81)
RDW: 20.2 % — ABNORMAL HIGH (ref 11.5–15.5)
WBC: 4.1 10*3/uL (ref 4.0–10.5)

## 2015-05-24 LAB — GLUCOSE, CAPILLARY
Glucose-Capillary: 96 mg/dL (ref 65–99)
Glucose-Capillary: 91 mg/dL (ref 65–99)
Glucose-Capillary: 88 mg/dL (ref 65–99)
Glucose-Capillary: 98 mg/dL (ref 65–99)

## 2015-05-24 LAB — PREPARE RBC (CROSSMATCH)

## 2015-05-24 LAB — ABO/RH: ABO/RH(D): B POS

## 2015-05-24 MED ORDER — IOHEXOL 300 MG/ML  SOLN
50.0000 mL | Freq: Once | INTRAMUSCULAR | Status: AC | PRN
  Administered 2015-05-24: 10 mL via INTRAVENOUS

## 2015-05-24 MED ORDER — SODIUM CHLORIDE 0.9 % IV SOLN: Freq: Once | INTRAVENOUS | Status: DC

## 2015-05-24 MED ORDER — LIDOCAINE HCL 1 % IJ SOLN
INTRAMUSCULAR | Status: AC
  Filled 2015-05-24: qty 20

## 2015-05-24 NOTE — Clinical Documentation Improvement (Signed)
Would you please further clarify current state of kidney disease POA?  Already have CKD stage 4.  Acute renal failure Unable to determine Other clinical explanation  Thank You, Margretta Sidle ,RN Clinical Documentation Specialist:    [facility] [phone] Cell - [phone]

## 2015-05-24 NOTE — Procedures (Signed)
Suprapubic catheter upsizing for cysto Upsized to 14 fr No comp/EBL

## 2015-05-24 NOTE — Clinical Social Work Note (Signed)
Patient is from [facility] in [place]. Mr. Bercier has a guardian - Radene Gunning, SW with [facility] [phone]). CSW will follow-up with SW on Friday regarding patient's return to facility.   Lawerance Matsuo Givens, MSW, LCSW Licensed Clinical Social Worker [place] [phone]

## 2015-05-24 NOTE — Progress Notes (Signed)
[facility]                                                                              Patient Demographics  Zachary [name], is a 69 y.o. male, DOB - Apr 05, 1946, [id number]  Admit date - 05/22/2015   Admitting Physician Velvet Bathe, MD  Outpatient Primary MD for the patient is FANTA,TESFAYE, MD  LOS - 2   No chief complaint on file.     HPI on 05/22/2015 by Dr. Velvet Bathe Zachary [name] is a 69 y.o. male with h/o MR, Bladder neck contracture with plans for operation (which had to be halted due to Hyperkalemia) currently with suprapubic catheter, CKD, HTN. He currently has no new complaints and upon preop evaluation was found to have elevated K levels. We were subsequently consulted for further evaluation and recommendations. K levels at 6.8  Assessment & Plan   Hyperkalemia -Resolved, Was 6.8 upon admission -Currently 4.9, with treatment -Likely secondary to urinary obstruction -Continue to monitor BMP  BPH/Bladder neck contracture with urinary obstruction -Urology consulted and appreciated (patient had suprapubic catheter placed 5 weeks ago) he -Possible SP tube change (upsize by IR today) vs dilation of bladder neck -Pending further recommendations from urology  Diabetes mellitus, type 2 -Tradjenta held, continue insulin sliding scale and CABG monitoring  Chronic kidney disease, stage IV -Creatinine appears to be close to baseline -Cr 3.17, GFR 21 -Continue to monitor BMP -Patient's creatinine has been above 3 for quite some time.  Documentation reviewed from nephrology in [place], [place] (Dr. Lowanda [name]) who states patient has CKDstage 4.  Anemia of chronic disease -Continue iron supplementation -Baseline hemoglobin appears to be 8 -Currently 7.2 -Will transfuse 1uPRBCs -Continue to monitor CBC  Hypothyroidism -Continue Synthroid  Hypertension -Continue Coreg and Demadex  Hyperlipidemia -Continue statin  Code Status: Full   Family Communication:  None at bedside  Disposition Plan: Admitted. Pending surgery/recommendations from urology.   Time Spent in minutes   30 minutes  Procedures  None  Consults   Urology, Dr. Duaine Dredge  DVT Prophylaxis  heparin  Lab Results  Component Value Date   PLT 113* 05/24/2015    Medications  Scheduled Meds: . sodium chloride   Intravenous Once  . carvedilol  6.25 mg Oral BID WC  . feeding supplement (NEPRO CARB STEADY)  237 mL Oral Q1500  . ferrous sulfate  325 mg Oral Q breakfast  . heparin  5,000 Units Subcutaneous 3 times per day  . insulin aspart  0-15 Units Subcutaneous TID WC  . isosorbide-hydrALAZINE  1 tablet Oral TID  . levothyroxine  75 mcg Oral QAC breakfast  . simvastatin  20 mg Oral QHS  . sodium chloride  3 mL Intravenous Q12H  . torsemide  20 mg Oral Daily   Continuous Infusions: . sodium chloride 125 mL/hr at 05/24/15 0232   PRN Meds:.  Antibiotics    Anti-infectives    Start     Dose/Rate Route Frequency Ordered Stop   05/22/15 0919  ceFAZolin (ANCEF) IVPB 2 g/50 mL premix  Status:  Discontinued     2 g 100 mL/hr over 30 Minutes Intravenous 30 min pre-op 05/22/15 0919 05/22/15 1403  Subjective:   Lawson Radar seen and examined today.  Patient has mental retardation.  Denies pain, chest pain, abdominal pain, shortness of breath at this time. Cannot answer many questions.    Objective:   Filed Vitals:   05/23/15 1817 05/23/15 2000 05/24/15 0351 05/24/15 0819  BP: 126/64 132/72 137/84 148/83  Pulse: 98 99 79 92  Temp: 98.4 F (36.9 C) 98 F (36.7 C) 98.4 F (36.9 C) 98.7 F (37.1 C)  TempSrc: Oral Oral Oral Oral  Resp: 18 17 18 17   Height:      Weight:  92.171 kg (203 lb 3.2 oz)    SpO2: 98% 100% 97% 100%    Wt Readings from Last 3 Encounters:  05/23/15 92.171 kg (203 lb 3.2 oz)  04/23/15 90.81 kg (200 lb 3.2 oz)  04/10/15 98.431 kg (217 lb)     Intake/Output Summary (Last 24 hours) at 05/24/15 1044 Last data filed at 05/24/15 [id number]   Gross per 24 hour  Intake 1242.08 ml  Output   3175 ml  Net -1932.92 ml    Exam  General: Well developed, well nourished, no distress  HEENT: NCAT,mucous membranes moist.   Cardiovascular: S1 S2 auscultated, RRR, no murmurs  Respiratory: Clear to auscultation  Abdomen: Soft, nontender, nondistended, + bowel sounds, has suprapubic cath  Extremities: warm dry without cyanosis clubbing or edema  Data Review   Micro Results Recent Results (from the past 240 hour(s))  MRSA PCR Screening     Status: None   Collection Time: 05/23/15  8:50 AM  Result Value Ref Range Status   MRSA by PCR NEGATIVE NEGATIVE Final    Comment:        The GeneXpert MRSA Assay (FDA approved for NASAL specimens only), is one component of a comprehensive MRSA colonization surveillance program. It is not intended to diagnose MRSA infection nor to guide or monitor treatment for MRSA infections.     Radiology Reports No results found.  CBC  Recent Labs Lab 05/22/15 0942 05/22/15 1501 05/23/15 0519 05/24/15 0521  WBC 5.8 4.6 4.1 4.1  HGB 7.8* 7.5* 7.4* 7.2*  HCT 24.6* 22.5* 22.2* 21.9*  PLT 125* 120* 112* 113*  MCV 97.2 96.2 96.9 97.3  MCH 30.8 32.1 32.3 32.0  MCHC 31.7 33.3 33.3 32.9  RDW 20.3* 20.5* 20.4* 20.2*    Chemistries   Recent Labs Lab 05/22/15 0942 05/22/15 1501 05/22/15 1840 05/23/15 0002 05/23/15 0519 05/23/15 1824 05/24/15 0521  NA 141  --  141  --  142  --  143  K 6.8*  --  5.9* 5.5* 5.7* 5.2* 4.9  CL 116*  --  117*  --  117*  --  119*  CO2 19*  --  21*  --  20*  --  17*  GLUCOSE 137*  --  155*  --  89  --  99  BUN 50*  --  41*  --  39*  --  34*  CREATININE 3.39* 3.31* 3.24*  --  3.32*  --  3.17*  CALCIUM 9.2  --  8.6*  --  8.9  --  8.4*   ------------------------------------------------------------------------------------------------------------------ estimated creatinine clearance is 24.8 mL/min (by C-G formula based on Cr of  3.17). ------------------------------------------------------------------------------------------------------------------ No results for input(s): HGBA1C in the last 72 hours. ------------------------------------------------------------------------------------------------------------------ No results for input(s): CHOL, HDL, LDLCALC, TRIG, CHOLHDL, LDLDIRECT in the last 72 hours. ------------------------------------------------------------------------------------------------------------------ No results for input(s): TSH, T4TOTAL, T3FREE, THYROIDAB in the last 72 hours.  Invalid input(s): FREET3 ------------------------------------------------------------------------------------------------------------------  No results for input(s): VITAMINB12, FOLATE, FERRITIN, TIBC, IRON, RETICCTPCT in the last 72 hours.  Coagulation profile No results for input(s): INR, PROTIME in the last 168 hours.  No results for input(s): DDIMER in the last 72 hours.  Cardiac Enzymes No results for input(s): CKMB, TROPONINI, MYOGLOBIN in the last 168 hours.  Invalid input(s): CK ------------------------------------------------------------------------------------------------------------------ Invalid input(s): POCBNP    Marithza Malachi D.O. on 05/24/2015 at 10:44 AM  Between 7am to 7pm - Pager - [phone]  After 7pm go to [email or web address] - password [email or web address]  And look for the night coverage person covering for me after hours  [facility] Office  [phone]

## 2015-05-24 NOTE — Care Management Important Message (Signed)
Important Message  Patient Details  Name: Zachary [name] MRN: [id number] Date of Birth: [birth year]   Medicare Important Message Given:  Yes-second notification given    Delorse Lek 05/24/2015, 11:03 AM

## 2015-05-24 NOTE — Progress Notes (Signed)
Called Zachary [name] Spell (pt's legal guardian) twice in order to obtain consent to give pt blood. Left her a message on the second attempt; waiting on her to return my phone call. Made Dr. Ree Kida aware.

## 2015-05-24 NOTE — Progress Notes (Addendum)
  Pt without complaint. In IR suite for SP tube change  Filed Vitals:   05/24/15 1424  BP: 182/95  Pulse: 100  Temp: 99.4 F (37.4 C)  Resp: 18    PE: NAD GU - Sp tube in place, urine clear  BMET    Component Value Date/Time   NA 143 05/24/2015 0521   K 4.9 05/24/2015 0521   CL 119* 05/24/2015 0521   CO2 17* 05/24/2015 0521   GLUCOSE 99 05/24/2015 0521   BUN 34* 05/24/2015 0521   CREATININE 3.17* 05/24/2015 0521   CALCIUM 8.4* 05/24/2015 0521   CALCIUM 8.3* 03/03/2015 0622   GFRNONAA 19* 05/24/2015 0521   GFRAA 22* 05/24/2015 0521    Assessment - CKD, bladder neck contracture, urinary retention, hydronephrosis - Plan: -IR to change SP tube today -pt with good UOP. but I ordered a renal U/S. I would expect him to have some residual dilation/hydro as he was in urinary retention for many months prior to GU consult/SP tube placement, but I hope to see an improvement in the hydro.  -after SP tube change, he can be discharged when medically stable. I'll set up for endoscopic incision / dilation of bladder neck contracture in the coming weeks.

## 2015-05-25 LAB — TYPE AND SCREEN
ABO/RH(D): B POS
Antibody Screen: NEGATIVE
Unit division: 0

## 2015-05-25 LAB — CBC
HCT: 26.1 % — ABNORMAL LOW (ref 39.0–52.0)
Hemoglobin: 8.7 g/dL — ABNORMAL LOW (ref 13.0–17.0)
MCH: 31.8 pg (ref 26.0–34.0)
MCHC: 33.3 g/dL (ref 30.0–36.0)
MCV: 95.3 fL (ref 78.0–100.0)
Platelets: 113 10*3/uL — ABNORMAL LOW (ref 150–400)
RBC: 2.74 MIL/uL — ABNORMAL LOW (ref 4.22–5.81)
RDW: 19.6 % — ABNORMAL HIGH (ref 11.5–15.5)
WBC: 5.7 10*3/uL (ref 4.0–10.5)

## 2015-05-25 LAB — BASIC METABOLIC PANEL
Anion gap: 5 (ref 5–15)
BUN: 27 mg/dL — ABNORMAL HIGH (ref 6–20)
CO2: 21 mmol/L — ABNORMAL LOW (ref 22–32)
Calcium: 8.7 mg/dL — ABNORMAL LOW (ref 8.9–10.3)
Chloride: 116 mmol/L — ABNORMAL HIGH (ref 101–111)
Creatinine, Ser: 3.02 mg/dL — ABNORMAL HIGH (ref 0.61–1.24)
GFR calc Af Amer: 23 mL/min — ABNORMAL LOW (ref >60)
GFR calc non Af Amer: 20 mL/min — ABNORMAL LOW (ref >60)
Glucose, Bld: 85 mg/dL (ref 65–99)
Potassium: 5.1 mmol/L (ref 3.5–5.1)
Sodium: 142 mmol/L (ref 135–145)

## 2015-05-25 LAB — GLUCOSE, CAPILLARY
Glucose-Capillary: 82 mg/dL (ref 65–99)
Glucose-Capillary: 124 mg/dL — ABNORMAL HIGH (ref 65–99)

## 2015-05-25 MED ORDER — NEPRO/CARBSTEADY PO LIQD: 237.0000 mL | Freq: Every day | ORAL | Status: DC

## 2015-05-25 MED ORDER — SODIUM POLYSTYRENE SULFONATE 15 GM/60ML PO SUSP
15.0000 g | Freq: Once | ORAL | Status: AC
  Administered 2015-05-25: 15 g via ORAL
  Filled 2015-05-25: qty 60

## 2015-05-25 NOTE — Clinical Social Work Note (Signed)
Patient has discharged back to [facility] today. Discharge information transmitted to facility and packet prepared. Patient was transported back to facility by [employer].  Nusaybah Ivie Givens, MSW, LCSW Licensed Clinical Social Worker [place] [phone]

## 2015-05-25 NOTE — Progress Notes (Signed)
Called to give report to [facility]. Spoke with [name] to give her report; verbalized understanding. D/t a scheduling conflict with transportation, [name] agrees to reschedule pt's appointment with Dr. Junious Silk. [name] states she will set up transportation to get the pt from the hospital back to the facility. IV removed without difficulty. Tele box removed and CCMD notified. Pt will leave unit via wheelchair.

## 2015-05-25 NOTE — Discharge Summary (Signed)
Physician Discharge Summary  Zachary [name] [id number] DOB: 30-Sep-1946 DOA: 05/22/2015  PCP: [name], MD  Admit date: 05/22/2015 Discharge date: 05/25/2015  Time spent: 45 minutes  Recommendations for Outpatient Follow-up:  Patient will be discharged to [facility] facility.  Patient will need to follow up with primary care provider within one week of discharge and have repeat CBC and BMP in one week.  Patient should continue medications as prescribed.  Patient should follow a carb modified diet. May continue physical activity as tolerated.  Patient will need to follow up with Dr. Junious Silk, urology, for outpatient bladder neck procedure.   Discharge Diagnoses:  Anemia BPH/bladder neck contracture with urinary obstruction Diabetes mellitus, type II Chronic kidney disease, stage IV Anemia of chronic disease Hypothyroidism next 1 hypertension Hyperlipidemia  Discharge Condition: Stable  Diet recommendation: Carb modified diet- reduced concentrated diet/sweet  Filed Weights   05/22/15 2127 05/23/15 2000 05/24/15 2100  Weight: 94.53 kg (208 lb 6.4 oz) 92.171 kg (203 lb 3.2 oz) 92.5 kg (203 lb 14.8 oz)    History of present illness:  on 05/22/2015 by Dr. Velvet Bathe Shirlyn Goltz is a 69 y.o. male with h/o MR, Bladder neck contracture with plans for operation (which had to be halted due to Hyperkalemia) currently with suprapubic catheter, CKD, HTN. He currently has no new complaints and upon preop evaluation was found to have elevated K levels. We were subsequently consulted for further evaluation and recommendations. K levels at 6.8  Hospital Course:  Hyperkalemia -Resolved, Was 6.8 upon admission -Currently 51, with treatment -Likely secondary to urinary obstruction -Repeat BMP in 1 week  BPH/Bladder neck contracture with urinary obstruction -Urology consulted and appreciated (patient had suprapubic catheter placed 5 weeks ago) -s/p suprapubic catheter upsizing for  cystoscopy -Urology will follow up with patient for bladder neck contracture as an outpatient  Diabetes mellitus, type 2 -Tradjenta was held during hospitalization -Continue her medications upon discharge  Chronic kidney disease, stage IV -Creatinine appears to be close to baseline -Cr 3.02 -Repeat BMP in 1 week -Patient's creatinine has been above 3 for quite some time. Documentation reviewed from nephrology in [place], [place] (Dr. Lowanda Foster) who states patient has CKDstage 4.  Anemia of chronic disease -Continue iron supplementation -Baseline hemoglobin appears to be 8 -Currently 8.7, after receiving 1 unit PRBC -Monitor CBC in 1 week  Hypothyroidism -Continue Synthroid  Hypertension -Continue Coreg and Demadex  Hyperlipidemia -Continue statin  Procedures  Suprapubic cath exchange  Consults  Urology, Dr. Junious Silk Interventional radiology  Discharge Exam: Filed Vitals:   05/25/15 0901  BP: 134/81  Pulse: 100  Temp: 98.2 F (36.8 C)  Resp: 18   Exam  General: Well developed, well nourished, no distress  HEENT: NCAT,mucous membranes moist.   Cardiovascular: S1 S2 auscultated, RRR, no murmurs  Respiratory: Clear to auscultation  Abdomen: Soft, nontender, nondistended, + bowel sounds, suprapubic cath  Extremities: warm dry without cyanosis clubbing or edema  Discharge Instructions      Discharge Instructions    Discharge instructions    Complete by:  As directed   Patient will be discharged to [facility] facility.  Patient will need to follow up with primary care provider within one week of discharge and have repeat CBC and BMP in one week.  Patient should continue medications as prescribed.  Patient should follow a carb modified diet. May continue physical activity as tolerated.  Patient will need to follow up with Dr. Junious Silk, urology, for outpatient bladder neck procedure.  Medication List    TAKE these medications         carvedilol 6.25 MG tablet  Commonly known as:  COREG  Take 1 tablet (6.25 mg total) by mouth 2 (two) times daily with a meal.     feeding supplement (NEPRO CARB STEADY) Liqd  Take 237 mLs by mouth daily at 3 pm.     ferrous sulfate 325 (65 FE) MG tablet  Take 325 mg by mouth daily with breakfast.     isosorbide-hydrALAZINE 20-37.5 MG per tablet  Commonly known as:  BIDIL  Take 1 tablet by mouth 3 (three) times daily.     levothyroxine 75 MCG tablet  Commonly known as:  SYNTHROID, LEVOTHROID  Take 75 mcg by mouth daily before breakfast.     linagliptin 5 MG Tabs tablet  Commonly known as:  TRADJENTA  Take 5 mg by mouth daily.     niacin 1000 MG CR tablet  Commonly known as:  NIASPAN  Take 1,000 mg by mouth at bedtime.     simvastatin 20 MG tablet  Commonly known as:  ZOCOR  Take 20 mg by mouth at bedtime.     sulfamethoxazole-trimethoprim 800-160 MG per tablet  Commonly known as:  BACTRIM DS,SEPTRA DS  Take 1 tablet by mouth 2 (two) times daily.     torsemide 20 MG tablet  Commonly known as:  DEMADEX  Take 20 mg by mouth daily.     Vitamin D (Ergocalciferol) 50000 UNITS Caps capsule  Commonly known as:  DRISDOL  Take 1 capsule (50,000 Units total) by mouth every 7 (seven) days.       No Known Allergies Follow-up Information    Call Festus Aloe, MD.   Specialty:  Urology   Contact information:   [place] [address] [phone]       Follow up with [facility], [place]. Schedule an appointment as soon as possible for a visit in 1 week.   Specialty:  Internal Medicine   Why:  Hospital follow up, repeat CBC and BMP   Contact information:   [address] [address] [phone]        The results of significant diagnostics from this hospitalization (including imaging, microbiology, ancillary and laboratory) are listed below for reference.    Significant Diagnostic Studies: Ir Catheter Tube Change  05/25/2015    CLINICAL DATA:  Suprapubic catheter upsized for cystoscopy  EXAM: IR CATHETER TUBE CHANGE  FLUOROSCOPY TIME:  12 seconds  MEDICATIONS AND MEDICAL HISTORY: None  ANESTHESIA/SEDATION: None  CONTRAST:  10 cc Omnipaque 300  PROCEDURE: The procedure, risks, benefits, and alternatives were explained to the patient. Questions regarding the procedure were encouraged and answered. The patient understands and consents to the procedure.  The lower abdomen was prepped with Betadine in a sterile fashion, and a sterile drape was applied covering the operative field. A sterile gown and sterile gloves were used for the procedure.  The existing 12) she per pubic pigtail catheter was exchanged over an Amplatz wire for a 14 French pigtail catheter. It was looped and string fixed in the bladder. Contrast was injected. It was sewn to the skin within 0 Prolene knot.  FINDINGS: Imaged demonstrates a 80 French suprapubic catheter with its tip in the bladder.  COMPLICATIONS: None  IMPRESSION: Successful suprapubic catheter exchange for a 14 French pigtail catheter.   Electronically Signed   By: Marybelle Killings M.D.   On: 05/25/2015 07:56   US Renal  05/24/2015   CLINICAL DATA:  Hydronephrosis.  EXAM: RENAL / URINARY TRACT ULTRASOUND COMPLETE  COMPARISON:  04/16/2015  FINDINGS: Right Kidney:  Length: 12.3 cm. Mild increased cortical echogenicity. Mild hydronephrosis slightly improved. No focal mass.  Left Kidney:  Length: 10.8 cm. Minimal increased cortical echogenicity. Minimal prominence of the left intrarenal collecting system with moderate improvement compared to the previous exam. 2.3 cm cyst over the upper pole.  Bladder:  Appears normal for degree of bladder distention.  IMPRESSION: Normal size kidneys with mild increased cortical echogenicity which can be seen in medical renal disease. Mild right-sided hydronephrosis and minimal prominence of the left intrarenal collecting system with improvement bilaterally compared to the prior  exam.  2.3 cm left renal cyst.   Electronically Signed   By: Marin Olp M.D.   On: 05/24/2015 17:04    Microbiology: Recent Results (from the past 240 hour(s))  MRSA PCR Screening     Status: None   Collection Time: 05/23/15  8:50 AM  Result Value Ref Range Status   MRSA by PCR NEGATIVE NEGATIVE Final    Comment:        The GeneXpert MRSA Assay (FDA approved for NASAL specimens only), is one component of a comprehensive MRSA colonization surveillance program. It is not intended to diagnose MRSA infection nor to guide or monitor treatment for MRSA infections.      Labs: Basic Metabolic Panel:  Recent Labs Lab 05/22/15 0942 05/22/15 1501 05/22/15 1840 05/23/15 0002 05/23/15 0519 05/23/15 1824 05/24/15 0521 05/25/15 0433  NA 141  --  141  --  142  --  143 142  K 6.8*  --  5.9* 5.5* 5.7* 5.2* 4.9 5.1  CL 116*  --  117*  --  117*  --  119* 116*  CO2 19*  --  21*  --  20*  --  17* 21*  GLUCOSE 137*  --  155*  --  89  --  99 85  BUN 50*  --  41*  --  39*  --  34* 27*  CREATININE 3.39* 3.31* 3.24*  --  3.32*  --  3.17* 3.02*  CALCIUM 9.2  --  8.6*  --  8.9  --  8.4* 8.7*   Liver Function Tests: No results for input(s): AST, ALT, ALKPHOS, BILITOT, PROT, ALBUMIN in the last 168 hours. No results for input(s): LIPASE, AMYLASE in the last 168 hours. No results for input(s): AMMONIA in the last 168 hours. CBC:  Recent Labs Lab 05/22/15 0942 05/22/15 1501 05/23/15 0519 05/24/15 0521 05/25/15 0433  WBC 5.8 4.6 4.1 4.1 5.7  HGB 7.8* 7.5* 7.4* 7.2* 8.7*  HCT 24.6* 22.5* 22.2* 21.9* 26.1*  MCV 97.2 96.2 96.9 97.3 95.3  PLT 125* 120* 112* 113* 113*   Cardiac Enzymes: No results for input(s): CKTOTAL, CKMB, CKMBINDEX, TROPONINI in the last 168 hours. BNP: BNP (last 3 results) No results for input(s): BNP in the last 8760 hours.  ProBNP (last 3 results) No results for input(s): PROBNP in the last 8760 hours.  CBG:  Recent Labs Lab 05/24/15 0816 05/24/15 1136  05/24/15 1643 05/24/15 2202 05/25/15 0800  GLUCAP 96 91 88 98 82       Signed:  Kealey Kemmer  [facility] 05/25/2015, 10:34 AM

## 2015-05-26 DIAGNOSIS — R339 Retention of urine, unspecified: Secondary | ICD-10-CM | POA: Diagnosis not present

## 2015-05-26 DIAGNOSIS — Z466 Encounter for fitting and adjustment of urinary device: Secondary | ICD-10-CM | POA: Diagnosis not present

## 2015-05-26 DIAGNOSIS — N139 Obstructive and reflux uropathy, unspecified: Secondary | ICD-10-CM | POA: Diagnosis not present

## 2015-05-26 DIAGNOSIS — E119 Type 2 diabetes mellitus without complications: Secondary | ICD-10-CM | POA: Diagnosis not present

## 2015-05-26 DIAGNOSIS — I10 Essential (primary) hypertension: Secondary | ICD-10-CM | POA: Diagnosis not present

## 2015-05-26 DIAGNOSIS — N400 Enlarged prostate without lower urinary tract symptoms: Secondary | ICD-10-CM | POA: Diagnosis not present

## 2015-05-28 DIAGNOSIS — I10 Essential (primary) hypertension: Secondary | ICD-10-CM | POA: Diagnosis not present

## 2015-05-28 DIAGNOSIS — R339 Retention of urine, unspecified: Secondary | ICD-10-CM | POA: Diagnosis not present

## 2015-05-28 DIAGNOSIS — Z466 Encounter for fitting and adjustment of urinary device: Secondary | ICD-10-CM | POA: Diagnosis not present

## 2015-05-28 DIAGNOSIS — N400 Enlarged prostate without lower urinary tract symptoms: Secondary | ICD-10-CM | POA: Diagnosis not present

## 2015-05-28 DIAGNOSIS — E119 Type 2 diabetes mellitus without complications: Secondary | ICD-10-CM | POA: Diagnosis not present

## 2015-05-28 DIAGNOSIS — N139 Obstructive and reflux uropathy, unspecified: Secondary | ICD-10-CM | POA: Diagnosis not present

## 2015-05-29 ENCOUNTER — Other Ambulatory Visit (HOSPITAL_COMMUNITY)
Admission: AD | Admit: 2015-05-29 | Discharge: 2015-05-29 | Disposition: A | Discharge status: Home / Self Care | Payer: Medicare Other | Source: Other Acute Inpatient Hospital | Attending: Internal Medicine | Admitting: Internal Medicine

## 2015-05-29 DIAGNOSIS — E119 Type 2 diabetes mellitus without complications: Secondary | ICD-10-CM | POA: Diagnosis not present

## 2015-05-29 DIAGNOSIS — F79 Unspecified intellectual disabilities: Secondary | ICD-10-CM | POA: Diagnosis not present

## 2015-05-29 DIAGNOSIS — N400 Enlarged prostate without lower urinary tract symptoms: Secondary | ICD-10-CM | POA: Diagnosis not present

## 2015-05-29 DIAGNOSIS — N139 Obstructive and reflux uropathy, unspecified: Secondary | ICD-10-CM | POA: Diagnosis not present

## 2015-05-29 DIAGNOSIS — R339 Retention of urine, unspecified: Secondary | ICD-10-CM | POA: Diagnosis not present

## 2015-05-29 DIAGNOSIS — N184 Chronic kidney disease, stage 4 (severe): Secondary | ICD-10-CM | POA: Diagnosis not present

## 2015-05-29 DIAGNOSIS — I10 Essential (primary) hypertension: Secondary | ICD-10-CM | POA: Diagnosis not present

## 2015-05-29 DIAGNOSIS — Z23 Encounter for immunization: Secondary | ICD-10-CM | POA: Diagnosis not present

## 2015-05-29 DIAGNOSIS — E1165 Type 2 diabetes mellitus with hyperglycemia: Secondary | ICD-10-CM | POA: Diagnosis not present

## 2015-05-29 DIAGNOSIS — Z466 Encounter for fitting and adjustment of urinary device: Secondary | ICD-10-CM | POA: Diagnosis not present

## 2015-05-29 LAB — COMPREHENSIVE METABOLIC PANEL
ALT: 18 U/L (ref 17–63)
AST: 22 U/L (ref 15–41)
Albumin: 3.5 g/dL (ref 3.5–5.0)
Alkaline Phosphatase: 59 U/L (ref 38–126)
Anion gap: 7 (ref 5–15)
BUN: 41 mg/dL — ABNORMAL HIGH (ref 6–20)
CO2: 21 mmol/L — ABNORMAL LOW (ref 22–32)
Calcium: 8.9 mg/dL (ref 8.9–10.3)
Chloride: 109 mmol/L (ref 101–111)
Creatinine, Ser: 4.41 mg/dL — ABNORMAL HIGH (ref 0.61–1.24)
GFR calc Af Amer: 15 mL/min — ABNORMAL LOW (ref >60)
GFR calc non Af Amer: 13 mL/min — ABNORMAL LOW (ref >60)
Glucose, Bld: 109 mg/dL — ABNORMAL HIGH (ref 65–99)
Potassium: 5.1 mmol/L (ref 3.5–5.1)
Sodium: 137 mmol/L (ref 135–145)
Total Bilirubin: 0.3 mg/dL (ref 0.3–1.2)
Total Protein: 7.1 g/dL (ref 6.5–8.1)

## 2015-05-29 LAB — CBC WITH DIFFERENTIAL/PLATELET
Basophils Absolute: 0 10*3/uL (ref 0.0–0.1)
Basophils Relative: 1 % (ref 0–1)
Eosinophils Absolute: 0.1 10*3/uL (ref 0.0–0.7)
Eosinophils Relative: 3 % (ref 0–5)
HCT: 25.9 % — ABNORMAL LOW (ref 39.0–52.0)
Hemoglobin: 8.6 g/dL — ABNORMAL LOW (ref 13.0–17.0)
Lymphocytes Relative: 18 % (ref 12–46)
Lymphs Abs: 0.7 10*3/uL (ref 0.7–4.0)
MCH: 32.3 pg (ref 26.0–34.0)
MCHC: 33.2 g/dL (ref 30.0–36.0)
MCV: 97.4 fL (ref 78.0–100.0)
Monocytes Absolute: 1 10*3/uL (ref 0.1–1.0)
Monocytes Relative: 24 % — ABNORMAL HIGH (ref 3–12)
Neutro Abs: 2.3 10*3/uL (ref 1.7–7.7)
Neutrophils Relative %: 54 % (ref 43–77)
Platelets: 152 10*3/uL (ref 150–400)
RBC: 2.66 MIL/uL — ABNORMAL LOW (ref 4.22–5.81)
RDW: 18.7 % — ABNORMAL HIGH (ref 11.5–15.5)
WBC: 4.2 10*3/uL (ref 4.0–10.5)

## 2015-05-30 ENCOUNTER — Telehealth: Payer: Self-pay

## 2015-05-30 NOTE — Telephone Encounter (Signed)
Patient was previously scheduled for left arm arteriovenous fistula versus graft insertion by Dr. Scot Dock on 04/10/15. Surgery was cancelled on that date, per anesthesiologist request (abnormal labs). This nurse called to inquire from Dr. Florentina Addison office, if patient is now ready to reschedule access placement. Thayer Headings at Dr. Florentina Addison office states that patient has had multiple procedures since June 24th and their office will make [facility]VS aware when needing to reschedule. Verbalized understanding. No further action at this time.

## 2015-06-01 DIAGNOSIS — N139 Obstructive and reflux uropathy, unspecified: Secondary | ICD-10-CM | POA: Diagnosis not present

## 2015-06-01 DIAGNOSIS — I10 Essential (primary) hypertension: Secondary | ICD-10-CM | POA: Diagnosis not present

## 2015-06-01 DIAGNOSIS — R339 Retention of urine, unspecified: Secondary | ICD-10-CM | POA: Diagnosis not present

## 2015-06-01 DIAGNOSIS — E119 Type 2 diabetes mellitus without complications: Secondary | ICD-10-CM | POA: Diagnosis not present

## 2015-06-01 DIAGNOSIS — N400 Enlarged prostate without lower urinary tract symptoms: Secondary | ICD-10-CM | POA: Diagnosis not present

## 2015-06-01 DIAGNOSIS — Z466 Encounter for fitting and adjustment of urinary device: Secondary | ICD-10-CM | POA: Diagnosis not present

## 2015-06-05 DIAGNOSIS — N139 Obstructive and reflux uropathy, unspecified: Secondary | ICD-10-CM | POA: Diagnosis not present

## 2015-06-05 DIAGNOSIS — N400 Enlarged prostate without lower urinary tract symptoms: Secondary | ICD-10-CM | POA: Diagnosis not present

## 2015-06-05 DIAGNOSIS — Z466 Encounter for fitting and adjustment of urinary device: Secondary | ICD-10-CM | POA: Diagnosis not present

## 2015-06-05 DIAGNOSIS — R339 Retention of urine, unspecified: Secondary | ICD-10-CM | POA: Diagnosis not present

## 2015-06-05 DIAGNOSIS — E119 Type 2 diabetes mellitus without complications: Secondary | ICD-10-CM | POA: Diagnosis not present

## 2015-06-05 DIAGNOSIS — I10 Essential (primary) hypertension: Secondary | ICD-10-CM | POA: Diagnosis not present

## 2015-06-07 ENCOUNTER — Other Ambulatory Visit: Payer: Self-pay | Admitting: Urology

## 2015-06-07 DIAGNOSIS — N320 Bladder-neck obstruction: Secondary | ICD-10-CM | POA: Diagnosis not present

## 2015-06-07 DIAGNOSIS — R312 Other microscopic hematuria: Secondary | ICD-10-CM | POA: Diagnosis not present

## 2015-06-08 DIAGNOSIS — N400 Enlarged prostate without lower urinary tract symptoms: Secondary | ICD-10-CM | POA: Diagnosis not present

## 2015-06-08 DIAGNOSIS — R339 Retention of urine, unspecified: Secondary | ICD-10-CM | POA: Diagnosis not present

## 2015-06-08 DIAGNOSIS — Z466 Encounter for fitting and adjustment of urinary device: Secondary | ICD-10-CM | POA: Diagnosis not present

## 2015-06-08 DIAGNOSIS — E119 Type 2 diabetes mellitus without complications: Secondary | ICD-10-CM | POA: Diagnosis not present

## 2015-06-08 DIAGNOSIS — I10 Essential (primary) hypertension: Secondary | ICD-10-CM | POA: Diagnosis not present

## 2015-06-08 DIAGNOSIS — N139 Obstructive and reflux uropathy, unspecified: Secondary | ICD-10-CM | POA: Diagnosis not present

## 2015-06-11 ENCOUNTER — Other Ambulatory Visit: Payer: Self-pay | Admitting: *Deleted

## 2015-06-12 ENCOUNTER — Encounter (HOSPITAL_COMMUNITY): Payer: Self-pay | Admitting: *Deleted

## 2015-06-12 NOTE — Progress Notes (Signed)
Spoke with [name], with DSS.  She stated she would be out of the office tomorrow and wanted to sign the consent for Pt today .  Consent faxed to [place] at [phone].  Asked [name] to fax back signed consent and guardianship papers for Pt to [phone] and phone [phone].

## 2015-06-13 ENCOUNTER — Encounter: Payer: Self-pay | Admitting: *Deleted

## 2015-06-13 ENCOUNTER — Ambulatory Visit (HOSPITAL_COMMUNITY): Payer: Medicare Other | Admitting: Vascular Surgery

## 2015-06-13 MED ORDER — CHLORHEXIDINE GLUCONATE CLOTH 2 % EX PADS: 6.0000 | MEDICATED_PAD | Freq: Once | CUTANEOUS | Status: DC

## 2015-06-13 MED ORDER — DEXTROSE 5 % IV SOLN: 1.5000 g | INTRAVENOUS | Status: DC

## 2015-06-13 MED ORDER — SODIUM CHLORIDE 0.9 % IV SOLN
INTRAVENOUS | Status: DC
  Administered 2015-06-14: 10 mL/h via INTRAVENOUS

## 2015-06-13 NOTE — Progress Notes (Signed)
Anesthesia Chart Review: SAME DAY WORK-UP.  69 year old male scheduled for LUE AVF tomorrow by Dr. Scot Dock. Surgery was initially scheduled for 04/10/15 but was canceled on the day of surgery due to hypertension and hyperkalemia (K+ 6.1) with peaked T waves on EKG. Since then he underwent transurethral incision of bladder neck, suprapubic tube change on 05/22/15 at [facility]. Nephrologist is Dr. Lowanda Foster.  History includes CKD stage IV not yet on HD, DM2, hypothyroidism, anemia, HLD, mental retardation. He resides at [facility].  05/22/15 EKG: NSR.  03/03/15 Echo: Normal LV function; EF 55-60%;mild LVH; grade 1 diastolic dysfunction with elevated LV filling pressure; mild LAE; trace MR and TR.  04/16/15 CXR: No active disease.  On 05/29/15, K+ 5.1, Cr 4.41. H/H 8.6/25.9 (since 02/2015, HGB range has been 6.6 - 9.7). He is for labs tomorrow to reassess. Definitive plan following anesthesia evaluation and review of labs.   [facility]/Anesthesiology Phone [phone] 06/13/2015 12:22 PM

## 2015-06-13 NOTE — Progress Notes (Signed)
Pt SDW -pre-op call completed by pt nurse, Baker Janus, at [facility]. Pt nurse denies pt C/O SOB, chest pain and being under the care of a cardiologist. Nurse made aware to stop NSAID's, otc vitamins and herbal medications. Nurse made aware to hold AM oral diabetic medication linagliptin (TRADJENTA) the morning of procedure. Ebony Hail, PA, anesthesia, to review pt history; pt is mentally disabled. Nurse verbalized understanding of all pre-op instructions.

## 2015-06-14 ENCOUNTER — Encounter (HOSPITAL_COMMUNITY)
Admission: RE | Disposition: A | Discharge status: Skilled Nursing Facility | Payer: Self-pay | Source: Ambulatory Visit | Attending: Vascular Surgery

## 2015-06-14 ENCOUNTER — Ambulatory Visit (HOSPITAL_COMMUNITY)
Admission: RE | Admit: 2015-06-14 | Discharge: 2015-06-14 | Disposition: A | Discharge status: Skilled Nursing Facility | Payer: Medicare Other | Source: Ambulatory Visit | Attending: Vascular Surgery | Admitting: Vascular Surgery

## 2015-06-14 DIAGNOSIS — D649 Anemia, unspecified: Secondary | ICD-10-CM | POA: Diagnosis not present

## 2015-06-14 DIAGNOSIS — E1122 Type 2 diabetes mellitus with diabetic chronic kidney disease: Secondary | ICD-10-CM | POA: Diagnosis not present

## 2015-06-14 DIAGNOSIS — F79 Unspecified intellectual disabilities: Secondary | ICD-10-CM | POA: Insufficient documentation

## 2015-06-14 DIAGNOSIS — N184 Chronic kidney disease, stage 4 (severe): Secondary | ICD-10-CM | POA: Insufficient documentation

## 2015-06-14 DIAGNOSIS — Z5309 Procedure and treatment not carried out because of other contraindication: Secondary | ICD-10-CM | POA: Diagnosis not present

## 2015-06-14 DIAGNOSIS — E875 Hyperkalemia: Secondary | ICD-10-CM | POA: Diagnosis not present

## 2015-06-14 DIAGNOSIS — E785 Hyperlipidemia, unspecified: Secondary | ICD-10-CM | POA: Insufficient documentation

## 2015-06-14 DIAGNOSIS — I129 Hypertensive chronic kidney disease with stage 1 through stage 4 chronic kidney disease, or unspecified chronic kidney disease: Secondary | ICD-10-CM | POA: Diagnosis not present

## 2015-06-14 DIAGNOSIS — E039 Hypothyroidism, unspecified: Secondary | ICD-10-CM | POA: Diagnosis not present

## 2015-06-14 DIAGNOSIS — I5189 Other ill-defined heart diseases: Secondary | ICD-10-CM | POA: Diagnosis not present

## 2015-06-14 LAB — POCT I-STAT 4, (NA,K, GLUC, HGB,HCT)
Glucose, Bld: 150 mg/dL — ABNORMAL HIGH (ref 65–99)
Glucose, Bld: 158 mg/dL — ABNORMAL HIGH (ref 65–99)
HCT: 25 % — ABNORMAL LOW (ref 39.0–52.0)
HCT: 25 % — ABNORMAL LOW (ref 39.0–52.0)
Hemoglobin: 8.5 g/dL — ABNORMAL LOW (ref 13.0–17.0)
Hemoglobin: 8.5 g/dL — ABNORMAL LOW (ref 13.0–17.0)
Potassium: 6.3 mmol/L (ref 3.5–5.1)
Potassium: 6.5 mmol/L (ref 3.5–5.1)
Sodium: 140 mmol/L (ref 135–145)
Sodium: 140 mmol/L (ref 135–145)

## 2015-06-14 SURGERY — ARTERIOVENOUS (AV) FISTULA CREATION
Anesthesia: Monitor Anesthesia Care | Laterality: Left

## 2015-06-14 MED ORDER — SODIUM CHLORIDE 0.9 % IV SOLN
INTRAVENOUS | Status: DC
  Administered 2015-06-14 (×2): 10 mL/h via INTRAVENOUS

## 2015-06-14 MED ORDER — SODIUM POLYSTYRENE SULFONATE 15 GM/60ML PO SUSP
30.0000 g | Freq: Once | ORAL | Status: DC
  Filled 2015-06-14: qty 120

## 2015-06-14 MED ORDER — PROPOFOL 10 MG/ML IV BOLUS
INTRAVENOUS | Status: AC
  Filled 2015-06-14: qty 20

## 2015-06-14 MED ORDER — MIDAZOLAM HCL 2 MG/2ML IJ SOLN
INTRAMUSCULAR | Status: AC
  Filled 2015-06-14: qty 4

## 2015-06-14 MED ORDER — FENTANYL CITRATE (PF) 250 MCG/5ML IJ SOLN
INTRAMUSCULAR | Status: AC
  Filled 2015-06-14: qty 5

## 2015-06-14 MED ORDER — DEXTROSE 5 % IV SOLN
INTRAVENOUS | Status: AC
  Filled 2015-06-14: qty 1.5

## 2015-06-14 NOTE — Progress Notes (Signed)
Dr. Scot Dock has spoken with caregiver and patient.  Surgery was cancelled d/t K of 6.5.  New orders received from Dr. Scot Dock. Obtained Kayexalate from pharmacy and instructed caregiver on how to give.  Office to re-schedule surgery date and time.   Discharged to home without problems.   HR ranging from 87-115, no ectopy noted.  da

## 2015-06-14 NOTE — Progress Notes (Signed)
Notified Dr. Ola Spurr of K6.2, specimen was redrawn per his order.  Patient is asymptomatic and was placed on monitor.(NSR)

## 2015-06-15 DIAGNOSIS — N400 Enlarged prostate without lower urinary tract symptoms: Secondary | ICD-10-CM | POA: Diagnosis not present

## 2015-06-15 DIAGNOSIS — I10 Essential (primary) hypertension: Secondary | ICD-10-CM | POA: Diagnosis not present

## 2015-06-15 DIAGNOSIS — Z466 Encounter for fitting and adjustment of urinary device: Secondary | ICD-10-CM | POA: Diagnosis not present

## 2015-06-15 DIAGNOSIS — N139 Obstructive and reflux uropathy, unspecified: Secondary | ICD-10-CM | POA: Diagnosis not present

## 2015-06-15 DIAGNOSIS — E119 Type 2 diabetes mellitus without complications: Secondary | ICD-10-CM | POA: Diagnosis not present

## 2015-06-15 DIAGNOSIS — R339 Retention of urine, unspecified: Secondary | ICD-10-CM | POA: Diagnosis not present

## 2015-06-15 NOTE — Progress Notes (Signed)
Preop instructions for:  Zachary [name]              Date of Birth__11/08/1946  Date of Procedure:  Tuesday 06/26/2015       Doctor:___Dr. Junious Silk Time to arrive at [facility]:  0830 am Report to: [place] SHORT STAY-TAKE EAST ELEVATORS TO THIRD (3RD) FLOOR.  Procedure time:   1100 am -1230 pm  Procedure:  CYSTOSCOPY WITH BALLOON DILATION, TRANSURETHRAL INCISION OF BLADDER NECK AND BILATERAL RETROGRADE PYELOGRAM  Any procedure time changes, MD office will notify you!   Do not eat or drink past midnight the night before your procedure.(To include any tube feedings-must be discontinued) Reminder:Follow bowel prep instructions per MD office!   Take these morning medications only with sips of water.(or give through gastrostomy or feeding tube).   LEVOTHYROXINE (SYNTHROID), CARVEDILOL (COREG)  Note: No Insulin or Diabetic meds should be given or taken the morning of the procedure!   Facility contact:    Phone:  [place]:  LEGAL [place]  [id number] Transportation contact phone#: [facility] WILL ARRANGE THIS   Please send day of procedure:current med list and meds last taken that day, confirm nothing by mouth status from what time, Patient Demographic info( to include DNR status, problem list, allergies)   RN contact name/phone#:  Gwenlyn Perking, Lanny Cramp SUPERVISOR and Fax [phone]   Bring Insurance card and picture ID Leave all jewelry and other valuables at place where living( no metal or rings to be worn) No contact lens Women-no make-up, no lotions,perfumes,powders Men-no colognes,lotions  Any questions day of procedure,call Endoscopy unit-[phone]!   Sent from :[facility] Presurgical Testing                   [place]                   Fax:[phone]  Sent by :RN: Geri Seminole, RN, BSN

## 2015-06-18 ENCOUNTER — Other Ambulatory Visit: Payer: Self-pay

## 2015-06-18 ENCOUNTER — Telehealth: Payer: Self-pay

## 2015-06-18 NOTE — Telephone Encounter (Signed)
Opened in error

## 2015-06-20 DIAGNOSIS — N139 Obstructive and reflux uropathy, unspecified: Secondary | ICD-10-CM | POA: Diagnosis not present

## 2015-06-20 DIAGNOSIS — N400 Enlarged prostate without lower urinary tract symptoms: Secondary | ICD-10-CM | POA: Diagnosis not present

## 2015-06-20 DIAGNOSIS — Z466 Encounter for fitting and adjustment of urinary device: Secondary | ICD-10-CM | POA: Diagnosis not present

## 2015-06-20 DIAGNOSIS — E119 Type 2 diabetes mellitus without complications: Secondary | ICD-10-CM | POA: Diagnosis not present

## 2015-06-20 DIAGNOSIS — R339 Retention of urine, unspecified: Secondary | ICD-10-CM | POA: Diagnosis not present

## 2015-06-20 DIAGNOSIS — I10 Essential (primary) hypertension: Secondary | ICD-10-CM | POA: Diagnosis not present

## 2015-06-22 DIAGNOSIS — N12 Tubulo-interstitial nephritis, not specified as acute or chronic: Secondary | ICD-10-CM | POA: Diagnosis not present

## 2015-06-22 DIAGNOSIS — N139 Obstructive and reflux uropathy, unspecified: Secondary | ICD-10-CM | POA: Diagnosis not present

## 2015-06-22 DIAGNOSIS — N184 Chronic kidney disease, stage 4 (severe): Secondary | ICD-10-CM | POA: Diagnosis not present

## 2015-06-22 DIAGNOSIS — E039 Hypothyroidism, unspecified: Secondary | ICD-10-CM | POA: Diagnosis not present

## 2015-06-22 DIAGNOSIS — Z23 Encounter for immunization: Secondary | ICD-10-CM | POA: Diagnosis not present

## 2015-06-22 DIAGNOSIS — D649 Anemia, unspecified: Secondary | ICD-10-CM | POA: Diagnosis not present

## 2015-06-22 DIAGNOSIS — F79 Unspecified intellectual disabilities: Secondary | ICD-10-CM | POA: Diagnosis not present

## 2015-06-22 DIAGNOSIS — E785 Hyperlipidemia, unspecified: Secondary | ICD-10-CM | POA: Diagnosis not present

## 2015-06-22 DIAGNOSIS — E1165 Type 2 diabetes mellitus with hyperglycemia: Secondary | ICD-10-CM | POA: Diagnosis not present

## 2015-06-25 ENCOUNTER — Other Ambulatory Visit: Payer: Self-pay | Admitting: Urology

## 2015-06-25 ENCOUNTER — Encounter (HOSPITAL_COMMUNITY): Payer: Self-pay | Admitting: *Deleted

## 2015-06-25 DIAGNOSIS — N320 Bladder-neck obstruction: Secondary | ICD-10-CM

## 2015-06-25 NOTE — Progress Notes (Signed)
Preop instructions for:  Zachary [name]  Date of Birth  [birth year]  Date of Procedure:  Friday 06/29/2015       Doctor:  DR. Junious Silk Time to arrive at [facility]:  1100 AM Report to: [place] (3RD) FLOOR.  Procedure [id number]  (130 PM-230 PM)  Procedure: CYSTOSCOPY WITH BALLOON DILATION, TRANSURETHRAL INCISION OF BLADDER NECK AND BILATERAL RETROGRADE PYELOGRAM  Any procedure time changes, MD office will notify you!   NOTHING TO EAT AFTER MIDNIGHT (NO SOLID FOODS)!  MAY HAVE CLEAR LIQUIDS FROM MIDNIGHT UP UNTIL 0730 AM, MORNING OF SURGERY THEN NOTHING UNTIL AFTER SURGERY!  NOTHING TO DRINK OR EAT AFTER 0730 AM MORNING OF SURGERY 06/29/2015!     CLEAR LIQUID DIET   Foods Allowed                                                                     Foods Excluded  Coffee and tea, regular and decaf                             liquids that you cannot  Plain Jell-O in any flavor                                             see through such as: Fruit ices (not with fruit pulp)                                     milk, soups, orange juice  Iced Popsicles                                    All solid food Carbonated beverages, regular and diet                                    Cranberry, grape and apple juices Sports drinks like Gatorade Lightly seasoned clear broth or consume(fat free) Sugar, honey syrup  Sample Menu Breakfast                                Lunch                                     Supper Cranberry juice                    Beef broth                            Chicken broth Jell-O  Grape juice                           Apple juice Coffee or tea                        Jell-O                                      Popsicle                                                Coffee or tea                        Coffee or  tea  _____________________________________________________________________  .(To include any tube feedings-must be discontinued) Reminder:Follow bowel prep instructions per MD office!   Take these morning medications only with sips of water.(or give through gastrostomy or feeding tube). LEVOTHYROXINE (SYNTHROID), CARVEDILOL (COREG)  Note: No Insulin or Diabetic meds should be given or taken the morning of the procedure!   Facility contact: [place]  Phone:  [place] POA: LEGAL [place] [id number]  Transportation contact phone#:  [facility] WILL ARRANGE   Please send day of procedure:current med list and meds last taken that day, confirm nothing by mouth status from what time, Patient Demographic info( to include DNR status, problem list, allergies)   RN contact name/phone#:GAIL KING, [place]  and Fax #:  [phone]   Bring Insurance card and picture ID Leave all jewelry and other valuables at place where living( no metal or rings to be worn) No contact lens Women-no make-up, no lotions,perfumes,powders Men-no colognes,lotions  Any questions day of procedure,call SHORT STAY UNIT unit-[phone]!   Sent from :[facility] Presurgical Testing                   [place]                   Fax:[phone]  Sent by :RN: Antionne Enrique M.Day Greb, RN, BSN

## 2015-06-26 ENCOUNTER — Ambulatory Visit (HOSPITAL_COMMUNITY): Admission: RE | Admit: 2015-06-26 | Payer: Medicare Other | Source: Ambulatory Visit | Admitting: Urology

## 2015-06-26 HISTORY — DX: Hyperkalemia: E87.5

## 2015-06-26 HISTORY — DX: Chronic kidney disease, unspecified: N18.9

## 2015-06-26 HISTORY — DX: Hyperlipidemia, unspecified: E78.5

## 2015-06-26 HISTORY — DX: Disorder of kidney and ureter, unspecified: N28.9

## 2015-06-26 HISTORY — DX: Edema, unspecified: R60.9

## 2015-06-26 HISTORY — DX: Acute kidney failure, unspecified: N18.9

## 2015-06-26 HISTORY — DX: Chronic kidney disease, unspecified: N17.9

## 2015-06-26 HISTORY — DX: Unspecified speech disturbances: R47.9

## 2015-06-26 SURGERY — CYSTOSCOPY, WITH RETROGRADE PYELOGRAM
Anesthesia: General

## 2015-06-27 DIAGNOSIS — E875 Hyperkalemia: Secondary | ICD-10-CM | POA: Diagnosis not present

## 2015-06-27 DIAGNOSIS — I10 Essential (primary) hypertension: Secondary | ICD-10-CM | POA: Diagnosis not present

## 2015-06-27 DIAGNOSIS — Z79899 Other long term (current) drug therapy: Secondary | ICD-10-CM | POA: Diagnosis not present

## 2015-06-27 DIAGNOSIS — D649 Anemia, unspecified: Secondary | ICD-10-CM | POA: Diagnosis not present

## 2015-06-27 DIAGNOSIS — D509 Iron deficiency anemia, unspecified: Secondary | ICD-10-CM | POA: Diagnosis not present

## 2015-06-27 DIAGNOSIS — N183 Chronic kidney disease, stage 3 (moderate): Secondary | ICD-10-CM | POA: Diagnosis not present

## 2015-06-29 ENCOUNTER — Encounter (HOSPITAL_COMMUNITY)
Admission: RE | Disposition: A | Discharge status: Home / Self Care | Payer: Self-pay | Source: Ambulatory Visit | Attending: Urology

## 2015-06-29 ENCOUNTER — Encounter (HOSPITAL_COMMUNITY): Payer: Self-pay | Admitting: *Deleted

## 2015-06-29 ENCOUNTER — Ambulatory Visit (HOSPITAL_COMMUNITY): Payer: Medicare Other | Admitting: Anesthesiology

## 2015-06-29 ENCOUNTER — Ambulatory Visit (HOSPITAL_COMMUNITY)
Admission: RE | Admit: 2015-06-29 | Discharge: 2015-06-29 | Disposition: A | Discharge status: Home / Self Care | Payer: Medicare Other | Source: Ambulatory Visit | Attending: Urology | Admitting: Urology

## 2015-06-29 DIAGNOSIS — E780 Pure hypercholesterolemia: Secondary | ICD-10-CM | POA: Insufficient documentation

## 2015-06-29 DIAGNOSIS — F79 Unspecified intellectual disabilities: Secondary | ICD-10-CM | POA: Insufficient documentation

## 2015-06-29 DIAGNOSIS — D649 Anemia, unspecified: Secondary | ICD-10-CM | POA: Diagnosis not present

## 2015-06-29 DIAGNOSIS — E119 Type 2 diabetes mellitus without complications: Secondary | ICD-10-CM | POA: Diagnosis not present

## 2015-06-29 DIAGNOSIS — N320 Bladder-neck obstruction: Secondary | ICD-10-CM | POA: Diagnosis not present

## 2015-06-29 DIAGNOSIS — I10 Essential (primary) hypertension: Secondary | ICD-10-CM | POA: Insufficient documentation

## 2015-06-29 DIAGNOSIS — N1330 Unspecified hydronephrosis: Secondary | ICD-10-CM | POA: Insufficient documentation

## 2015-06-29 DIAGNOSIS — E039 Hypothyroidism, unspecified: Secondary | ICD-10-CM | POA: Insufficient documentation

## 2015-06-29 DIAGNOSIS — N359 Urethral stricture, unspecified: Secondary | ICD-10-CM | POA: Insufficient documentation

## 2015-06-29 DIAGNOSIS — Z79899 Other long term (current) drug therapy: Secondary | ICD-10-CM | POA: Diagnosis not present

## 2015-06-29 DIAGNOSIS — Z435 Encounter for attention to cystostomy: Secondary | ICD-10-CM | POA: Diagnosis not present

## 2015-06-29 HISTORY — PX: CYSTOSCOPY W/ RETROGRADES: SHX1426

## 2015-06-29 LAB — CBC
HCT: 28.2 % — ABNORMAL LOW (ref 39.0–52.0)
Hemoglobin: 9.3 g/dL — ABNORMAL LOW (ref 13.0–17.0)
MCH: 33.9 pg (ref 26.0–34.0)
MCHC: 33 g/dL (ref 30.0–36.0)
MCV: 102.9 fL — ABNORMAL HIGH (ref 78.0–100.0)
Platelets: 117 10*3/uL — ABNORMAL LOW (ref 150–400)
RBC: 2.74 MIL/uL — ABNORMAL LOW (ref 4.22–5.81)
RDW: 17.1 % — ABNORMAL HIGH (ref 11.5–15.5)
WBC: 5 10*3/uL (ref 4.0–10.5)

## 2015-06-29 LAB — BASIC METABOLIC PANEL
Anion gap: 14 (ref 5–15)
BUN: 73 mg/dL — ABNORMAL HIGH (ref 6–20)
CO2: 18 mmol/L — ABNORMAL LOW (ref 22–32)
Calcium: 9.3 mg/dL (ref 8.9–10.3)
Chloride: 112 mmol/L — ABNORMAL HIGH (ref 101–111)
Creatinine, Ser: 3.84 mg/dL — ABNORMAL HIGH (ref 0.61–1.24)
GFR calc Af Amer: 17 mL/min — ABNORMAL LOW (ref >60)
GFR calc non Af Amer: 15 mL/min — ABNORMAL LOW (ref >60)
Glucose, Bld: 141 mg/dL — ABNORMAL HIGH (ref 65–99)
Potassium: 4.4 mmol/L (ref 3.5–5.1)
Sodium: 144 mmol/L (ref 135–145)

## 2015-06-29 SURGERY — CYSTOSCOPY, WITH RETROGRADE PYELOGRAM
Anesthesia: General

## 2015-06-29 MED ORDER — PROPOFOL 10 MG/ML IV BOLUS
INTRAVENOUS | Status: DC | PRN
  Administered 2015-06-29: 40 mg via INTRAVENOUS
  Administered 2015-06-29: 100 mg via INTRAVENOUS

## 2015-06-29 MED ORDER — NEOSTIGMINE METHYLSULFATE 10 MG/10ML IV SOLN
INTRAVENOUS | Status: DC | PRN
  Administered 2015-06-29: 4 mg via INTRAVENOUS

## 2015-06-29 MED ORDER — IOHEXOL 300 MG/ML  SOLN
INTRAMUSCULAR | Status: DC | PRN
  Administered 2015-06-29: 10 mL

## 2015-06-29 MED ORDER — PHENYLEPHRINE HCL 10 MG/ML IJ SOLN
INTRAMUSCULAR | Status: DC | PRN
  Administered 2015-06-29: 40 ug via INTRAVENOUS

## 2015-06-29 MED ORDER — PROMETHAZINE HCL 25 MG/ML IJ SOLN: 6.2500 mg | INTRAMUSCULAR | Status: DC | PRN

## 2015-06-29 MED ORDER — AMOXICILLIN-POT CLAVULANATE 875-125 MG PO TABS: 1.0000 | ORAL_TABLET | Freq: Two times a day (BID) | ORAL | Status: DC

## 2015-06-29 MED ORDER — HYDROCODONE-ACETAMINOPHEN 5-325 MG PO TABS: 1.0000 | ORAL_TABLET | Freq: Four times a day (QID) | ORAL | Status: DC | PRN

## 2015-06-29 MED ORDER — FENTANYL CITRATE (PF) 100 MCG/2ML IJ SOLN
INTRAMUSCULAR | Status: DC | PRN
  Administered 2015-06-29: 50 ug via INTRAVENOUS
  Administered 2015-06-29 (×2): 25 ug via INTRAVENOUS

## 2015-06-29 MED ORDER — FENTANYL CITRATE (PF) 100 MCG/2ML IJ SOLN: 25.0000 ug | INTRAMUSCULAR | Status: DC | PRN

## 2015-06-29 MED ORDER — SODIUM CHLORIDE 0.9 % IR SOLN
Status: DC | PRN
  Administered 2015-06-29: 3000 mL

## 2015-06-29 MED ORDER — LIDOCAINE HCL (CARDIAC) 20 MG/ML IV SOLN
INTRAVENOUS | Status: AC
  Filled 2015-06-29: qty 5

## 2015-06-29 MED ORDER — LACTATED RINGERS IV SOLN
INTRAVENOUS | Status: DC | PRN
  Administered 2015-06-29: 13:00:00 via INTRAVENOUS

## 2015-06-29 MED ORDER — ROCURONIUM BROMIDE 100 MG/10ML IV SOLN
INTRAVENOUS | Status: DC | PRN
  Administered 2015-06-29: 40 mg via INTRAVENOUS

## 2015-06-29 MED ORDER — ROCURONIUM BROMIDE 100 MG/10ML IV SOLN
INTRAVENOUS | Status: AC
  Filled 2015-06-29: qty 1

## 2015-06-29 MED ORDER — LIDOCAINE HCL (CARDIAC) 20 MG/ML IV SOLN
INTRAVENOUS | Status: DC | PRN
  Administered 2015-06-29: 60 mg via INTRAVENOUS

## 2015-06-29 MED ORDER — PROPOFOL 10 MG/ML IV BOLUS
INTRAVENOUS | Status: AC
  Filled 2015-06-29: qty 20

## 2015-06-29 MED ORDER — CEFAZOLIN SODIUM-DEXTROSE 2-3 GM-% IV SOLR
INTRAVENOUS | Status: AC
  Filled 2015-06-29: qty 50

## 2015-06-29 MED ORDER — SODIUM CHLORIDE 0.9 % IV SOLN
INTRAVENOUS | Status: DC | PRN
  Administered 2015-06-29: 15:00:00 via INTRAVENOUS

## 2015-06-29 MED ORDER — FENTANYL CITRATE (PF) 100 MCG/2ML IJ SOLN
INTRAMUSCULAR | Status: AC
  Filled 2015-06-29: qty 4

## 2015-06-29 MED ORDER — GLYCOPYRROLATE 0.2 MG/ML IJ SOLN
INTRAMUSCULAR | Status: DC | PRN
  Administered 2015-06-29: .5 mg via INTRAVENOUS

## 2015-06-29 MED ORDER — LACTATED RINGERS IV SOLN: INTRAVENOUS | Status: DC

## 2015-06-29 MED ORDER — ONDANSETRON HCL 4 MG/2ML IJ SOLN
INTRAMUSCULAR | Status: AC
  Filled 2015-06-29: qty 2

## 2015-06-29 MED ORDER — MEPERIDINE HCL 50 MG/ML IJ SOLN: 6.2500 mg | INTRAMUSCULAR | Status: DC | PRN

## 2015-06-29 MED ORDER — CEFAZOLIN SODIUM-DEXTROSE 2-3 GM-% IV SOLR
2.0000 g | INTRAVENOUS | Status: AC
  Administered 2015-06-29: 2 g via INTRAVENOUS

## 2015-06-29 SURGICAL SUPPLY — 26 items
BAG URINE DRAINAGE (UROLOGICAL SUPPLIES) ×3 IMPLANT
BAG URO CATCHER STRL LF (DRAPE) ×3 IMPLANT
BLADE SURG 15 STRL LF DISP TIS (BLADE) IMPLANT
BLADE SURG 15 STRL SS (BLADE)
CATH FOLEY 2W COUNCIL 20FR 5CC (CATHETERS) ×3 IMPLANT
CATH FOLEY 2W COUNCIL 5CC 16FR (CATHETERS) ×3 IMPLANT
CATH FOLEY 3WAY 30CC 22FR (CATHETERS) IMPLANT
CATH URET 5FR 28IN CONE TIP (BALLOONS) ×1
CATH URET 5FR 70CM CONE TIP (BALLOONS) ×2 IMPLANT
DRAPE CAMERA CLOSED 9X96 (DRAPES) IMPLANT
EVACUATOR MICROVAS BLADDER (UROLOGICAL SUPPLIES) ×3 IMPLANT
GAUZE SPONGE 4X4 12PLY STRL (GAUZE/BANDAGES/DRESSINGS) ×3 IMPLANT
GLOVE BIOGEL M STRL SZ7.5 (GLOVE) ×3 IMPLANT
GOWN STRL REUS W/TWL LRG LVL3 (GOWN DISPOSABLE) ×3 IMPLANT
GOWN STRL REUS W/TWL XL LVL3 (GOWN DISPOSABLE) ×3 IMPLANT
HOLDER FOLEY CATH W/STRAP (MISCELLANEOUS) IMPLANT
IV NS IRRIG 3000ML ARTHROMATIC (IV SOLUTION) IMPLANT
KIT ASPIRATION TUBING (SET/KITS/TRAYS/PACK) IMPLANT
LOOP CUT BIPOLAR 24F LRG (ELECTROSURGICAL) IMPLANT
MANIFOLD NEPTUNE II (INSTRUMENTS) ×3 IMPLANT
PACK CYSTO (CUSTOM PROCEDURE TRAY) ×3 IMPLANT
PLUG CATH AND CAP STER (CATHETERS) ×3 IMPLANT
SUT ETHILON 3 0 PS 1 (SUTURE) IMPLANT
SYR 30ML LL (SYRINGE) IMPLANT
TAPE CLOTH SURG 4X10 WHT LF (GAUZE/BANDAGES/DRESSINGS) ×3 IMPLANT
TUBING CONNECTING 10 (TUBING) ×3 IMPLANT

## 2015-06-29 NOTE — Anesthesia Procedure Notes (Signed)
Procedure Name: Intubation Date/Time: 06/29/2015 2:00 PM Performed by: Glory Buff Pre-anesthesia Checklist: Patient identified, Emergency Drugs available, Suction available and Patient being monitored Patient Re-evaluated:Patient Re-evaluated prior to inductionOxygen Delivery Method: Circle System Utilized Preoxygenation: Pre-oxygenation with 100% oxygen Intubation Type: IV induction Ventilation: Mask ventilation without difficulty Laryngoscope Size: Miller and 3 Grade View: Grade I Tube type: Oral Tube size: 7.5 mm Number of attempts: 1 Airway Equipment and Method: Stylet and Oral airway Placement Confirmation: ETT inserted through vocal cords under direct vision,  positive ETCO2 and breath sounds checked- equal and bilateral Secured at: 21 cm Tube secured with: Tape Dental Injury: Teeth and Oropharynx as per pre-operative assessment

## 2015-06-29 NOTE — Transfer of Care (Signed)
Immediate Anesthesia Transfer of Care Note  Patient: Zachary [name]  Procedure(s) Performed: Procedure(s): CYSTOSCOPY, DILATION OF URETHRAL STRICTURE WITH BILATERAL RETROGRADE PYELOGRAM,SUPRAPUBIC TUBE CHANGE (Bilateral)  Patient Location: PACU  Anesthesia Type:General  Level of Consciousness: awake, alert  and patient cooperative  Airway & Oxygen Therapy: Patient Spontanous Breathing and Patient connected to face mask oxygen  Post-op Assessment: Report given to RN and Post -op Vital signs reviewed and stable  Post vital signs: Reviewed and stable  Last Vitals:  Filed Vitals:   06/29/15 1503  BP: 136/84  Pulse:   Temp:   Resp:     Complications: No apparent anesthesia complications

## 2015-06-29 NOTE — H&P (Signed)
History of Present Illness  bladder neck contracture    bladder neck contracture-June 2016-patient seen as Hospital consult with severe bilateral hydroureteronephrosis and a distended bladder with acute on chronic renal failure. His BUN was up to 75 and creatinine 6.67. He underwent cystoscopy which revealed a dense bladder neck contracture. He then underwent suprapubic tube placement in IR and diuresed liters of urine over the coming days.    Today,  patient is well. SP draining well. No complaints. AVSS.     Past Medical History Problems  1. History of diabetes mellitus (Z86.39) 2. History of hypercholesterolemia (Z86.39) 3. History of hypertension (Z86.79) 4. History of hypothyroidism (Z86.39) 5. History of mental retardation (Z86.59) 6. History of pyelonephritis (Z87.440) 7. History of Normocytic anemia (D64.9)  Current Meds 1. BiDil TABS;  Therapy: (Recorded:08Jul2016) to Recorded 2. Carvedilol 6.25 MG Oral Tablet;  Therapy: (Recorded:08Jul2016) to Recorded 3. Ferrous Sulfate 325 (65 Fe) MG Oral Tablet;  Therapy: (Recorded:08Jul2016) to Recorded 4. Levothyroxine Sodium 75 MCG Oral Tablet;  Therapy: (Recorded:08Jul2016) to Recorded 5. Niaspan 1000 MG Oral Tablet Extended Release;  Therapy: (Recorded:08Jul2016) to Recorded 6. Simvastatin 20 MG Oral Tablet;  Therapy: (Recorded:08Jul2016) to Recorded 7. Torsemide 20 MG Oral Tablet;  Therapy: (Recorded:08Jul2016) to Recorded 8. Tradjenta 5 MG Oral Tablet;  Therapy: (Recorded:08Jul2016) to Recorded 9. Vitamin D 50000 UNIT CAPS;  Therapy: (Recorded:08Jul2016) to Recorded  Allergies Medication  1. No Known Drug Allergies  Social History Problems    Denied: History of Alcohol use   Denied: History of Caffeine use   Never a smoker   Number of children   Retired   Single  Review of Systems Genitourinary, constitutional, skin, eye, otolaryngeal, hematologic/lymphatic, cardiovascular, pulmonary, endocrine,  musculoskeletal, gastrointestinal, neurological and psychiatric system(s) were reviewed and pertinent findings if present are noted and are otherwise negative.     Vitals Vital Signs [Data Includes: Last 1 Day]  Recorded: [id number] 03:49PM  Blood Pressure: 157 / 97 Temperature: 97.4 F Heart Rate: 102  Physical Exam Constitutional: Well nourished and well developed . No acute distress.  ENT:. The ears and nose are normal in appearance.  Neck: The appearance of the neck is normal and no neck mass is present.  Pulmonary: No respiratory distress and normal respiratory rhythm and effort.  Cardiovascular: Heart rate and rhythm are normal . No peripheral edema.  Abdomen: The abdomen is soft and nontender. No masses are palpated. No CVA tenderness. No hernias are palpable. No hepatosplenomegaly noted. The suprapubic tube in place. Draining clear urine. Lymphatics: The femoral and inguinal nodes are not enlarged or tender.  Skin: Normal skin turgor, no visible rash and no visible skin lesions.  Neuro/Psych:. Mood and affect are appropriate.    Results/Data  Old records or history reviewed: epic notes, labs.    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 URINE CULTURE; Status:Hold For - Specimen/Data Collection,Appointment; Requested  for:08Jul2016;  2. Follow-up Schedule Surgery Office  Follow-up  Status: Hold For - Appointment   Requested for: [phone]  Discussion/Summary Bladder neck contracture-I discussed with the patient and his caregiver and the nature risks benefits and alternatives to cystoscopy with transurethral incision of bladder neck which may require balloon dilation, Foley placement, suprapubic tube change. We discussed alternatives such as no treatment and continuing with the suprapubic tube with periodic changes. We discussed risks of bladder neck dilation including incontinence, recurrence of BNC, bleeding and infection  among others. All questions answered. He would like to  proceed. He says he would like to get rid of the suprapubic tube.

## 2015-06-29 NOTE — Op Note (Signed)
Preoperative diagnosis:  1. 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 Prostatic stricture  Procedure(s): 1. Dilation of urethral stricture initial encounter 2. Bilateral retrograde pyelogram 3. Exchange of suprapubic tube  Surgeon: Dr.Eskridge  Resident: Dr. Carlota Raspberry  Anesthesia: Anesthesiologist: Montez Hageman, MD CRNA: Glory Buff, CRNA; Lollie Sails, CRNA  Complications: None  EBL: Minimal   Specimens: None  Intraoperative findings: 8 French narrowing in the mid prostatic urethra with a wide bladder neck and distal prostatic urethra at the veru. Left > Right hydronephrosis with crisp clear calyces without hydro extending into the calyces, primarily hydro of the renal pelvis and ureters with bilateral J-hooking.  Indication: See H&P  Description of procedure:  The patient was brought to the OR and general anesthesia was induced. The patient was placed in dorsal lithotomy and perioperative antibiotics were administered. The patient was prepped and draped in the usual fashion. The previous SPT was prepped into the field to allow exchange. A preoperative time out was performed prior to start of the procedure.  We began with cystourethroscopy and identified an 8 French stricture in the proximal urethra. We were able to easily place a sensor wire through the stricture and confirmed passage into the bladder with fluoroscopy. We then used [facility] dilators to serially dilate the stricture to 24 [place]. This was done easily.  We then entered the bladder with out 22 f rigid cystoscope. At this point we used a cone tipped catheter to perform bilateral retrograde pyelograms with findings described above.  With cystoscopy and RPG complete we turned our attention to the SPT. Under direct visualization with the cystoscope we placed a wire through the SPT and removed the previous SPT. We then gently dilated the tract with [facility] dilators to allow  placement of a 76 F council tip catheter which passed easily into the bladder under direct vision and the balloon was inflated with 10 cc's of sterile water.  We then filled and emptied his bladder and then left the bladder full to place the urethral catheter. We placed a 43 F council tip catheter over the sensor wire in the urethra. This passed easily into the bladder with good return of irrigant and the balloon was inflated with 10 cc's of sterile water.  The patient was then awoken from anesthesia and transferred to the PACU  Plan Discharge back to facility Follow up in 10-14 days to remove foley and leave SPT clamped. If the patient passes a voiding trial the SPT may be able to removed at a later date. We will plan on 5 days of antibiotics   Dr Junious Silk was present for the entire procedure.

## 2015-06-29 NOTE — Anesthesia Preprocedure Evaluation (Addendum)
Anesthesia Evaluation  Patient identified by MRN, date of birth, ID band Patient awake    Reviewed: Allergy & Precautions, H&P , NPO status , Patient's Chart, lab work & pertinent test results  Airway Mallampati: II  TM Distance: >3 FB Neck ROM: Full    Dental  (+) Poor Dentition, Missing, Dental Advisory Given,    Pulmonary neg pulmonary ROS,    Pulmonary exam normal       Cardiovascular hypertension, Pt. on medications Rhythm:Regular Rate:Normal     Neuro/Psych PSYCHIATRIC DISORDERS (mental retardation) Mentally disabled    GI/Hepatic   Endo/Other  diabetes, Type 2  Renal/GU CRFRenal disease     Musculoskeletal   Abdominal   Peds  Hematology  (+) anemia ,   Anesthesia Other Findings   Reproductive/Obstetrics                           Anesthesia Physical  Anesthesia Plan  ASA: III  Anesthesia Plan: General   Post-op Pain Management:    Induction: Intravenous  Airway Management Planned: Oral ETT  Additional Equipment:   Intra-op Plan:   Post-operative Plan: Extubation in OR  Informed Consent: I have reviewed the patients History and Physical, chart, labs and discussed the procedure including the risks, benefits and alternatives for the proposed anesthesia with the patient or authorized representative who has indicated his/her understanding and acceptance.     Plan Discussed with:   Anesthesia Plan Comments:        Anesthesia Quick Evaluation

## 2015-06-29 NOTE — Anesthesia Postprocedure Evaluation (Signed)
  Anesthesia Post-op Note  Patient: Zachary [name]  Procedure(s) Performed: Procedure(s) (LRB): CYSTOSCOPY, DILATION OF URETHRAL STRICTURE WITH BILATERAL RETROGRADE PYELOGRAM,SUPRAPUBIC TUBE CHANGE (Bilateral)  Patient Location: PACU  Anesthesia Type: MAC  Level of Consciousness: awake and alert   Airway and Oxygen Therapy: Patient Spontanous Breathing  Post-op Pain: mild  Post-op Assessment: Post-op Vital signs reviewed, Patient's Cardiovascular Status Stable, Respiratory Function Stable, Patent Airway and No signs of Nausea or vomiting  Last Vitals:  Filed Vitals:   06/29/15 1531  BP: 139/80  Pulse: 86  Temp: 36.3 C  Resp: 20    Post-op Vital Signs: stable   Complications: No apparent anesthesia complications

## 2015-06-29 NOTE — H&P (Signed)
Vascular and Vein Specialist of [facility]  Patient name: Zachary [name]: [id number] DOB: September 16, 1947Sex: male  REASON FOR CONSULT: Evaluate for hemodialysis access  HPI: Zachary [name] is a 69 y.o. male with a history of mental retardation who lives in a skilled nursing facility. He is here today with his appointed guardian. He is not a good historian. 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 He denies any recent uremic symptoms including nausea, vomiting, fatigue, anorexia, or palpitations. He is right-handed. He is not yet on dialysis.  I have reviewed the records that were sent with him. He has a history of congestive heart failure, hypertension, hyperkalemia, anemia, and stage IV chronic kidney disease. His GFR on 03/15/2015 was 13.  Past Medical History  Diagnosis Date  . Hypertension   . Diabetes mellitus   . Smoking   . Abnormal CT scan, kidney 10/06/2011  . UTI (lower urinary tract infection) 10/06/2011  . Bladder wall thickening 10/06/2011  . Acute pyelonephritis 10/07/2011  . 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 Poor historian poor historian  . BPH (benign prostatic hypertrophy)    Family History  Problem Relation Age of Onset  . Cancer Mother    SOCIAL HISTORY: History  Substance Use Topics  . Smoking status: Never Smoker   . Smokeless tobacco: Never Used  . Alcohol Use: 0.0 oz/week    0 Standard drinks or equivalent per week     Comment: occ. use    No Known Allergies Current Outpatient Prescriptions  Medication Sig Dispense Refill  . carvedilol (COREG) 6.25 MG tablet Take 1 tablet (6.25 mg total) by mouth 2 (two) times daily with a meal. 60 tablet 3  . ferrous sulfate 325 (65 FE) MG tablet Take 325 mg by mouth daily with breakfast.    . isosorbide-hydrALAZINE (BIDIL) 20-37.5 MG per tablet Take 1 tablet by  mouth 3 (three) times daily. 90 tablet 3  . levothyroxine (SYNTHROID, LEVOTHROID) 75 MCG tablet Take 75 mcg by mouth daily.    Marland Kitchen linagliptin (TRADJENTA) 5 MG TABS tablet Take 5 mg by mouth daily.    . niacin (NIASPAN) 1000 MG CR tablet Take 1,000 mg by mouth at bedtime.    . simvastatin (ZOCOR) 20 MG tablet Take 20 mg by mouth daily.    Marland Kitchen torsemide (DEMADEX) 20 MG tablet Take 20 mg by mouth daily.     . Vitamin D, Ergocalciferol, (DRISDOL) 50000 UNITS CAPS capsule Take 1 capsule (50,000 Units total) by mouth every 7 (seven) days. 30 capsule 3   No current facility-administered medications for this visit.   REVIEW OF SYSTEMS: Valu.Nieves ] denotes positive finding; [ ]  denotes negative finding  CARDIOVASCULAR: [ ]  chest pain [ ]  chest pressure [ ]  palpitations [ ]  orthopnea  [ ]  dyspnea on exertion [ ]  claudication [ ]  rest pain [ ]  DVT [ ]  phlebitis PULMONARY: [ ]  productive cough [ ]  asthma [ ]  wheezing NEUROLOGIC: [ ]  weakness [ ]  paresthesias [ ]  aphasia [ ]  amaurosis [ ]  dizziness HEMATOLOGIC: [ ]  bleeding problems [ ]  clotting disorders MUSCULOSKELETAL: [ ]  joint pain [ ]  joint swelling [ ]  leg swelling GASTROINTESTINAL: [ ]  blood in stool [ ]  hematemesis GENITOURINARY: [ ]  dysuria [ ]  hematuria PSYCHIATRIC: [ ]  history of major depression INTEGUMENTARY: [ ]  rashes [ ]  ulcers CONSTITUTIONAL: [ ]  fever [ ]  chills  PHYSICAL EXAM: Filed Vitals:   03/28/15 1159  BP: 168/96  Pulse: 85  Height: 5\' 6"  (1.676 m)  Weight: 217 lb (98.431 kg)  SpO2: 97%   GENERAL: The patient is a well-nourished male, in no acute distress. The vital signs are documented above. CARDIOVASCULAR: There is a regular rate and rhythm. I do not detect carotid bruits. He has palpable radial and brachial pulses bilaterally. PULMONARY: There is good air exchange bilaterally without wheezing or rales. ABDOMEN: Soft  and non-tender with normal pitched bowel sounds.  MUSCULOSKELETAL: There are no major deformities or cyanosis. NEUROLOGIC: No focal weakness or paresthesias are detected. SKIN: There are no ulcers or rashes noted. PSYCHIATRIC: The patient has a normal affect.  DATA:  I have independently interpreted his upper extremity vein mapping. On the right side, the forearm and upper arm cephalic vein looked marginal in size. The basilic vein may potentially be usable. On the left side, the forearm and upper arm cephalic vein are marginal in size. The basilic vein is also marginal in size.  I have independently interpreted his upper extremity arterial Doppler study which shows triphasic radial and ulnar waveforms bilaterally.  MEDICAL ISSUES: STAGE IV CHRONIC KIDNEY DISEASE: His veins are marginal in size and he may not be a candidate for fistula. However I have recommended placement of a left AV fistula or AV graft. I explained the procedure to the patient and his appointed guardian. I have explained the indications for placement of an AV fistula or AV graft. I've explained that if at all possible we will place an AV fistula. I have reviewed the risks of placement of an AV fistula including but not limited to: failure of the fistula to mature, need for subsequent interventions, and thrombosis. In addition I have reviewed the potential complications of placement of an AV graft. These risks include, but are not limited to, graft thrombosis, graft infection, wound healing problems, bleeding, arm swelling, and steal syndrome. All the patient's questions were answered and they are agreeable to proceed with surgery. His surgery is scheduled for 04/10/2015.   [name] [facility] [place]: [phone]

## 2015-07-03 ENCOUNTER — Encounter (HOSPITAL_COMMUNITY): Payer: Self-pay | Admitting: Urology

## 2015-07-04 DIAGNOSIS — N183 Chronic kidney disease, stage 3 (moderate): Secondary | ICD-10-CM | POA: Diagnosis not present

## 2015-07-04 DIAGNOSIS — I10 Essential (primary) hypertension: Secondary | ICD-10-CM | POA: Diagnosis not present

## 2015-07-04 DIAGNOSIS — Z79899 Other long term (current) drug therapy: Secondary | ICD-10-CM | POA: Diagnosis not present

## 2015-07-05 NOTE — Progress Notes (Signed)
2016 1:49 PM Status: Signed      Editor: Danna Hefty, RN (Registered Nurse)     Expand All Collapse All   Sunday Spillers from [facility]. And reported that they will not be able to transport patient here until 7:30 or 0800. I notified Colletta Maryland at Dr Nicole Cella office.

## 2015-07-05 NOTE — Progress Notes (Signed)
I spoke with Sunday Spillers, patient's [facility] at [facility].  Sunday Spillers is going to make sure patient is on the transportation schedule.

## 2015-07-06 ENCOUNTER — Encounter (HOSPITAL_COMMUNITY): Payer: Self-pay | Admitting: *Deleted

## 2015-07-06 ENCOUNTER — Ambulatory Visit (HOSPITAL_COMMUNITY)
Admission: RE | Admit: 2015-07-06 | Discharge: 2015-07-06 | Disposition: A | Discharge status: Home / Self Care | Payer: Medicare Other | Source: Ambulatory Visit | Attending: Vascular Surgery | Admitting: Vascular Surgery

## 2015-07-06 ENCOUNTER — Ambulatory Visit (HOSPITAL_COMMUNITY): Payer: Medicare Other | Admitting: Anesthesiology

## 2015-07-06 ENCOUNTER — Encounter (HOSPITAL_COMMUNITY)
Admission: RE | Disposition: A | Discharge status: Home / Self Care | Payer: Self-pay | Source: Ambulatory Visit | Attending: Vascular Surgery

## 2015-07-06 DIAGNOSIS — N401 Enlarged prostate with lower urinary tract symptoms: Secondary | ICD-10-CM | POA: Diagnosis present

## 2015-07-06 DIAGNOSIS — E875 Hyperkalemia: Secondary | ICD-10-CM | POA: Diagnosis present

## 2015-07-06 DIAGNOSIS — E860 Dehydration: Secondary | ICD-10-CM | POA: Diagnosis not present

## 2015-07-06 DIAGNOSIS — N186 End stage renal disease: Secondary | ICD-10-CM

## 2015-07-06 DIAGNOSIS — D638 Anemia in other chronic diseases classified elsewhere: Secondary | ICD-10-CM | POA: Diagnosis present

## 2015-07-06 DIAGNOSIS — E039 Hypothyroidism, unspecified: Secondary | ICD-10-CM | POA: Diagnosis present

## 2015-07-06 DIAGNOSIS — R1110 Vomiting, unspecified: Secondary | ICD-10-CM | POA: Diagnosis not present

## 2015-07-06 DIAGNOSIS — I129 Hypertensive chronic kidney disease with stage 1 through stage 4 chronic kidney disease, or unspecified chronic kidney disease: Secondary | ICD-10-CM | POA: Diagnosis not present

## 2015-07-06 DIAGNOSIS — E43 Unspecified severe protein-calorie malnutrition: Secondary | ICD-10-CM | POA: Diagnosis present

## 2015-07-06 DIAGNOSIS — N179 Acute kidney failure, unspecified: Secondary | ICD-10-CM | POA: Diagnosis not present

## 2015-07-06 DIAGNOSIS — E1122 Type 2 diabetes mellitus with diabetic chronic kidney disease: Secondary | ICD-10-CM

## 2015-07-06 DIAGNOSIS — D509 Iron deficiency anemia, unspecified: Secondary | ICD-10-CM | POA: Diagnosis present

## 2015-07-06 DIAGNOSIS — E1165 Type 2 diabetes mellitus with hyperglycemia: Secondary | ICD-10-CM | POA: Diagnosis not present

## 2015-07-06 DIAGNOSIS — N184 Chronic kidney disease, stage 4 (severe): Secondary | ICD-10-CM

## 2015-07-06 DIAGNOSIS — F79 Unspecified intellectual disabilities: Secondary | ICD-10-CM

## 2015-07-06 DIAGNOSIS — E785 Hyperlipidemia, unspecified: Secondary | ICD-10-CM | POA: Diagnosis present

## 2015-07-06 DIAGNOSIS — I252 Old myocardial infarction: Secondary | ICD-10-CM

## 2015-07-06 DIAGNOSIS — E870 Hyperosmolality and hypernatremia: Secondary | ICD-10-CM | POA: Diagnosis not present

## 2015-07-06 DIAGNOSIS — D649 Anemia, unspecified: Secondary | ICD-10-CM | POA: Diagnosis not present

## 2015-07-06 DIAGNOSIS — N400 Enlarged prostate without lower urinary tract symptoms: Secondary | ICD-10-CM | POA: Insufficient documentation

## 2015-07-06 DIAGNOSIS — Z809 Family history of malignant neoplasm, unspecified: Secondary | ICD-10-CM

## 2015-07-06 DIAGNOSIS — I120 Hypertensive chronic kidney disease with stage 5 chronic kidney disease or end stage renal disease: Secondary | ICD-10-CM

## 2015-07-06 DIAGNOSIS — I509 Heart failure, unspecified: Secondary | ICD-10-CM | POA: Insufficient documentation

## 2015-07-06 DIAGNOSIS — N19 Unspecified kidney failure: Secondary | ICD-10-CM | POA: Diagnosis not present

## 2015-07-06 DIAGNOSIS — Z6826 Body mass index (BMI) 26.0-26.9, adult: Secondary | ICD-10-CM

## 2015-07-06 DIAGNOSIS — R112 Nausea with vomiting, unspecified: Secondary | ICD-10-CM | POA: Diagnosis not present

## 2015-07-06 HISTORY — PX: AV FISTULA PLACEMENT: SHX1204

## 2015-07-06 LAB — POCT I-STAT 4, (NA,K, GLUC, HGB,HCT)
Glucose, Bld: 165 mg/dL — ABNORMAL HIGH (ref 65–99)
HCT: 30 % — ABNORMAL LOW (ref 39.0–52.0)
Hemoglobin: 10.2 g/dL — ABNORMAL LOW (ref 13.0–17.0)
Potassium: 4.5 mmol/L (ref 3.5–5.1)
Sodium: 149 mmol/L — ABNORMAL HIGH (ref 135–145)

## 2015-07-06 LAB — GLUCOSE, CAPILLARY
Glucose-Capillary: 145 mg/dL — ABNORMAL HIGH (ref 65–99)
Glucose-Capillary: 149 mg/dL — ABNORMAL HIGH (ref 65–99)
Glucose-Capillary: 164 mg/dL — ABNORMAL HIGH (ref 65–99)

## 2015-07-06 SURGERY — ARTERIOVENOUS (AV) FISTULA CREATION
Anesthesia: General | Site: Arm Lower | Laterality: Left

## 2015-07-06 MED ORDER — PHENYLEPHRINE HCL 10 MG/ML IJ SOLN
INTRAMUSCULAR | Status: DC | PRN
  Administered 2015-07-06: 120 ug via INTRAVENOUS
  Administered 2015-07-06: 80 ug via INTRAVENOUS

## 2015-07-06 MED ORDER — EPHEDRINE SULFATE 50 MG/ML IJ SOLN
INTRAMUSCULAR | Status: AC
  Filled 2015-07-06: qty 1

## 2015-07-06 MED ORDER — PHENYLEPHRINE 40 MCG/ML (10ML) SYRINGE FOR IV PUSH (FOR BLOOD PRESSURE SUPPORT)
PREFILLED_SYRINGE | INTRAVENOUS | Status: AC
  Filled 2015-07-06: qty 10

## 2015-07-06 MED ORDER — OXYCODONE HCL 5 MG PO TABS: 5.0000 mg | ORAL_TABLET | Freq: Once | ORAL | Status: DC | PRN

## 2015-07-06 MED ORDER — SUCCINYLCHOLINE CHLORIDE 20 MG/ML IJ SOLN
INTRAMUSCULAR | Status: DC | PRN
  Administered 2015-07-06: 60 mg via INTRAVENOUS

## 2015-07-06 MED ORDER — FENTANYL CITRATE (PF) 250 MCG/5ML IJ SOLN
INTRAMUSCULAR | Status: AC
  Filled 2015-07-06: qty 5

## 2015-07-06 MED ORDER — EPHEDRINE SULFATE 50 MG/ML IJ SOLN
INTRAMUSCULAR | Status: DC | PRN
  Administered 2015-07-06: 5 mg via INTRAVENOUS

## 2015-07-06 MED ORDER — ACETAMINOPHEN 325 MG PO TABS
325.0000 mg | ORAL_TABLET | ORAL | Status: DC | PRN
Start: 2015-07-06 — End: 2015-07-06

## 2015-07-06 MED ORDER — CHLORHEXIDINE GLUCONATE CLOTH 2 % EX PADS: 6.0000 | MEDICATED_PAD | Freq: Once | CUTANEOUS | Status: DC

## 2015-07-06 MED ORDER — SUCCINYLCHOLINE CHLORIDE 20 MG/ML IJ SOLN
INTRAMUSCULAR | Status: AC
  Filled 2015-07-06: qty 1

## 2015-07-06 MED ORDER — 0.9 % SODIUM CHLORIDE (POUR BTL) OPTIME
TOPICAL | Status: DC | PRN
  Administered 2015-07-06: 1000 mL

## 2015-07-06 MED ORDER — SODIUM CHLORIDE 0.9 % IJ SOLN
INTRAMUSCULAR | Status: AC
  Filled 2015-07-06: qty 10

## 2015-07-06 MED ORDER — THROMBIN 20000 UNITS EX SOLR
CUTANEOUS | Status: AC
  Filled 2015-07-06: qty 20000

## 2015-07-06 MED ORDER — OXYCODONE HCL 5 MG/5ML PO SOLN: 5.0000 mg | Freq: Once | ORAL | Status: DC | PRN

## 2015-07-06 MED ORDER — DEXTROSE 5 % IV SOLN
1.5000 g | INTRAVENOUS | Status: AC
  Administered 2015-07-06: 1.5 g via INTRAVENOUS
  Filled 2015-07-06: qty 1.5

## 2015-07-06 MED ORDER — PROTAMINE SULFATE 10 MG/ML IV SOLN
INTRAVENOUS | Status: DC | PRN
  Administered 2015-07-06 (×5): 10 mg via INTRAVENOUS

## 2015-07-06 MED ORDER — PROPOFOL 10 MG/ML IV BOLUS
INTRAVENOUS | Status: DC | PRN
  Administered 2015-07-06: 100 mg via INTRAVENOUS
  Administered 2015-07-06: 20 mg via INTRAVENOUS

## 2015-07-06 MED ORDER — HEPARIN SODIUM (PORCINE) 1000 UNIT/ML IJ SOLN
INTRAMUSCULAR | Status: DC | PRN
  Administered 2015-07-06: 7000 [IU] via INTRAVENOUS

## 2015-07-06 MED ORDER — PROMETHAZINE HCL 25 MG/ML IJ SOLN
INTRAMUSCULAR | Status: AC
  Filled 2015-07-06: qty 1

## 2015-07-06 MED ORDER — CALCIUM CHLORIDE 10 % IV SOLN
INTRAVENOUS | Status: DC | PRN
  Administered 2015-07-06: 600 mg via INTRAVENOUS
  Administered 2015-07-06: 400 mg via INTRAVENOUS

## 2015-07-06 MED ORDER — SODIUM CHLORIDE 0.9 % IR SOLN
Status: DC | PRN
  Administered 2015-07-06: 14:00:00

## 2015-07-06 MED ORDER — HYDROCODONE-ACETAMINOPHEN 5-325 MG PO TABS
1.0000 | ORAL_TABLET | Freq: Four times a day (QID) | ORAL | Status: DC | PRN
Start: 2015-07-06 — End: 2015-10-11

## 2015-07-06 MED ORDER — LIDOCAINE HCL (CARDIAC) 20 MG/ML IV SOLN
INTRAVENOUS | Status: AC
  Filled 2015-07-06: qty 5

## 2015-07-06 MED ORDER — MIDAZOLAM HCL 5 MG/5ML IJ SOLN
INTRAMUSCULAR | Status: DC | PRN
  Administered 2015-07-06: 1 mg via INTRAVENOUS

## 2015-07-06 MED ORDER — VASOPRESSIN 20 UNIT/ML IV SOLN
INTRAVENOUS | Status: DC | PRN
  Administered 2015-07-06: 1 [IU] via INTRAVENOUS

## 2015-07-06 MED ORDER — PROMETHAZINE HCL 25 MG/ML IJ SOLN
6.2500 mg | Freq: Once | INTRAMUSCULAR | Status: AC
  Administered 2015-07-06: 6.25 mg via INTRAVENOUS

## 2015-07-06 MED ORDER — THROMBIN 20000 UNITS EX SOLR
CUTANEOUS | Status: DC | PRN
  Administered 2015-07-06: 20000 mL via TOPICAL

## 2015-07-06 MED ORDER — SODIUM CHLORIDE 0.9 % IV SOLN
INTRAVENOUS | Status: DC
  Administered 2015-07-06: 10:00:00 via INTRAVENOUS

## 2015-07-06 MED ORDER — FENTANYL CITRATE (PF) 100 MCG/2ML IJ SOLN: 25.0000 ug | INTRAMUSCULAR | Status: DC | PRN

## 2015-07-06 MED ORDER — HEPARIN SODIUM (PORCINE) 1000 UNIT/ML IJ SOLN
INTRAMUSCULAR | Status: AC
  Filled 2015-07-06: qty 1

## 2015-07-06 MED ORDER — ONDANSETRON HCL 4 MG/2ML IJ SOLN
INTRAMUSCULAR | Status: AC
Start: 2015-07-06 — End: 2015-07-06
  Filled 2015-07-06: qty 2

## 2015-07-06 MED ORDER — ONDANSETRON HCL 4 MG/2ML IJ SOLN
INTRAMUSCULAR | Status: DC | PRN
  Administered 2015-07-06: 4 mg via INTRAVENOUS

## 2015-07-06 MED ORDER — PHENYLEPHRINE HCL 10 MG/ML IJ SOLN
10.0000 mg | INTRAVENOUS | Status: DC | PRN
  Administered 2015-07-06: 80 ug/min via INTRAVENOUS

## 2015-07-06 MED ORDER — FENTANYL CITRATE (PF) 100 MCG/2ML IJ SOLN
INTRAMUSCULAR | Status: DC | PRN
  Administered 2015-07-06: 100 ug via INTRAVENOUS
  Administered 2015-07-06: 50 ug via INTRAVENOUS
  Administered 2015-07-06: 25 ug via INTRAVENOUS
  Administered 2015-07-06: 50 ug via INTRAVENOUS

## 2015-07-06 MED ORDER — LIDOCAINE HCL (PF) 1 % IJ SOLN
INTRAMUSCULAR | Status: AC
  Filled 2015-07-06: qty 30

## 2015-07-06 MED ORDER — CALCIUM CHLORIDE 10 % IV SOLN
INTRAVENOUS | Status: AC
  Filled 2015-07-06: qty 10

## 2015-07-06 MED ORDER — ALBUMIN HUMAN 5 % IV SOLN
INTRAVENOUS | Status: DC | PRN
  Administered 2015-07-06: 15:00:00 via INTRAVENOUS

## 2015-07-06 MED ORDER — MIDAZOLAM HCL 2 MG/2ML IJ SOLN
INTRAMUSCULAR | Status: AC
  Filled 2015-07-06: qty 2

## 2015-07-06 MED ORDER — PROPOFOL 10 MG/ML IV BOLUS
INTRAVENOUS | Status: AC
  Filled 2015-07-06: qty 20

## 2015-07-06 MED ORDER — ACETAMINOPHEN 160 MG/5ML PO SOLN
325.0000 mg | ORAL | Status: DC | PRN
  Filled 2015-07-06: qty 20.3

## 2015-07-06 MED ORDER — CARVEDILOL 3.125 MG PO TABS
6.2500 mg | ORAL_TABLET | Freq: Once | ORAL | Status: AC
  Administered 2015-07-06: 6.25 mg via ORAL
  Filled 2015-07-06: qty 2

## 2015-07-06 SURGICAL SUPPLY — 28 items
ARMBAND PINK RESTRICT EXTREMIT (MISCELLANEOUS) ×2 IMPLANT
CANISTER SUCTION 2500CC (MISCELLANEOUS) ×2 IMPLANT
CANNULA VESSEL 3MM 2 BLNT TIP (CANNULA) ×2 IMPLANT
CLIP TI MEDIUM 6 (CLIP) ×2 IMPLANT
CLIP TI WIDE RED SMALL 6 (CLIP) ×2 IMPLANT
COVER PROBE W GEL 5X96 (DRAPES) IMPLANT
DECANTER SPIKE VIAL GLASS SM (MISCELLANEOUS) ×2 IMPLANT
ELECT REM PT RETURN 9FT ADLT (ELECTROSURGICAL) ×2
ELECTRODE REM PT RTRN 9FT ADLT (ELECTROSURGICAL) ×1 IMPLANT
GLOVE BIO SURGEON STRL SZ7.5 (GLOVE) ×2 IMPLANT
GLOVE BIOGEL PI IND STRL 8 (GLOVE) ×1 IMPLANT
GLOVE BIOGEL PI INDICATOR 8 (GLOVE) ×1
GOWN STRL REUS W/ TWL LRG LVL3 (GOWN DISPOSABLE) ×3 IMPLANT
GOWN STRL REUS W/TWL LRG LVL3 (GOWN DISPOSABLE) ×6
GRAFT GORETEX STRT 4-7X45 (Vascular Products) ×2 IMPLANT
KIT BASIN OR (CUSTOM PROCEDURE TRAY) ×2 IMPLANT
KIT ROOM TURNOVER OR (KITS) ×2 IMPLANT
LIQUID BAND (GAUZE/BANDAGES/DRESSINGS) ×2 IMPLANT
NS IRRIG 1000ML POUR BTL (IV SOLUTION) ×2 IMPLANT
PACK CV ACCESS (CUSTOM PROCEDURE TRAY) ×2 IMPLANT
PAD ARMBOARD 7.5X6 YLW CONV (MISCELLANEOUS) ×4 IMPLANT
SPONGE SURGIFOAM ABS GEL 100 (HEMOSTASIS) ×2 IMPLANT
SUT PROLENE 6 0 BV (SUTURE) ×6 IMPLANT
SUT VIC AB 3-0 SH 27 (SUTURE) ×4
SUT VIC AB 3-0 SH 27X BRD (SUTURE) ×2 IMPLANT
SUT VICRYL 4-0 PS2 18IN ABS (SUTURE) ×4 IMPLANT
UNDERPAD 30X30 INCONTINENT (UNDERPADS AND DIAPERS) ×2 IMPLANT
WATER STERILE IRR 1000ML POUR (IV SOLUTION) ×2 IMPLANT

## 2015-07-06 NOTE — Progress Notes (Signed)
Phenergan 6.25 mg IV ordered by Dr Therisa Doyne , documented as ordered by  Dr Tobias Alexander in error.

## 2015-07-06 NOTE — Anesthesia Preprocedure Evaluation (Addendum)
Anesthesia Evaluation  Patient identified by MRN, date of birth, ID band Patient awake    Reviewed: Allergy & Precautions, NPO status , Patient's Chart, lab work & pertinent test results, reviewed documented beta blocker date and time   History of Anesthesia Complications Negative for: history of anesthetic complications  Airway Mallampati: IV  TM Distance: >3 FB Neck ROM: Full  Mouth opening: Limited Mouth Opening Comment: Poor exam by patient effort Dental  (+) Teeth Intact,    Pulmonary neg pulmonary ROS,    breath sounds clear to auscultation       Cardiovascular hypertension, Pt. on medications and Pt. on home beta blockers (-) Past MI and (-) CHF (-) dysrhythmias  Rhythm:Regular     Neuro/Psych PSYCHIATRIC DISORDERS Anxiety negative neurological ROS     GI/Hepatic Neg liver ROS, Decreased PO x 2 weeks according to patient and actively nauseated   Endo/Other  diabetesHypothyroidism   Renal/GU CRF and ESRFRenal disease     Musculoskeletal   Abdominal   Peds  Hematology  (+) anemia ,   Anesthesia Other Findings This CRNA finds AW to be better than a class IV; maybe a II-III.  There are missing teeth and some of teeth are loose and covered in plaque.   KBJames, CRNA       Reproductive/Obstetrics                           Anesthesia Physical Anesthesia Plan  ASA: III  Anesthesia Plan: General   Post-op Pain Management:    Induction: Intravenous, Cricoid pressure planned and Rapid sequence  Airway Management Planned: Oral ETT  Additional Equipment: None  Intra-op Plan:   Post-operative Plan: Extubation in OR  Informed Consent: I have reviewed the patients History and Physical, chart, labs and discussed the procedure including the risks, benefits and alternatives for the proposed anesthesia with the patient or authorized representative who has indicated his/her understanding  and acceptance.   Dental advisory given  Plan Discussed with: CRNA and Surgeon  Anesthesia Plan Comments:         Anesthesia Quick Evaluation

## 2015-07-06 NOTE — Anesthesia Procedure Notes (Signed)
Procedure Name: Intubation Date/Time: 07/06/2015 2:31 PM Performed by: Williemae Area B Pre-anesthesia Checklist: Patient identified, Emergency Drugs available, Suction available and Patient being monitored Patient Re-evaluated:Patient Re-evaluated prior to inductionOxygen Delivery Method: Circle system utilized Preoxygenation: Pre-oxygenation with 100% oxygen Intubation Type: IV induction, Cricoid Pressure applied and Rapid sequence Ventilation: Mask ventilation without difficulty Laryngoscope Size: Mac and 4 Grade View: Grade II Tube type: Oral Tube size: 7.5 mm Number of attempts: 1 Airway Equipment and Method: Stylet Placement Confirmation: ETT inserted through vocal cords under direct vision,  positive ETCO2 and breath sounds checked- equal and bilateral Secured at: 21 (cm at gums/teeth) cm Tube secured with: Tape Dental Injury: Teeth and Oropharynx as per pre-operative assessment

## 2015-07-06 NOTE — Interval H&P Note (Signed)
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 After consideration of risks, benefits and other options for treatment, the patient has consented to  Procedure(s): LEFT ARM ARTERIOVENOUS (AV) FISTULA CREATION VERSUS INSERTION LEFT ARM ARTERIOVENOUS GORTEX GRAFT (Left) as a surgical intervention .  [name] history has been reviewed, patient examined, no change in status, stable for surgery.  I have reviewed the patient's chart and labs.  Questions were answered to the patient's satisfaction.     [name]

## 2015-07-06 NOTE — H&P (View-Only) (Signed)
V[facility] and Vein Specialist of [facility]  Patient name: Zachary [name]: [id number] DOB: 03-19-1947Sex: male  REASON FOR CONSULT: Evaluate for hemodialysis access  HPI: Zachary [name] is a 69 y.o. male with a history of mental retardation who lives in a skilled nursing facility. He is here today with his appointed guardian. He is not a good historian. 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 He denies any recent uremic symptoms including nausea, vomiting, fatigue, anorexia, or palpitations. He is right-handed. He is not yet on dialysis.  I have reviewed the records that were sent with him. He has a history of congestive heart failure, hypertension, hyperkalemia, anemia, and stage IV chronic kidney disease. His GFR on 03/15/2015 was 13.  Past Medical History  Diagnosis Date  . Hypertension   . Diabetes mellitus   . Smoking   . Abnormal CT scan, kidney 10/06/2011  . UTI (lower urinary tract infection) 10/06/2011  . Bladder wall thickening 10/06/2011  . Acute pyelonephritis 10/07/2011  . 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 Poor historian poor historian  . BPH (benign prostatic hypertrophy)    Family History  Problem Relation Age of Onset  . Cancer Mother    SOCIAL HISTORY: History  Substance Use Topics  . Smoking status: Never Smoker   . Smokeless tobacco: Never Used  . Alcohol Use: 0.0 oz/week    0 Standard drinks or equivalent per week     Comment: occ. use    No Known Allergies Current Outpatient Prescriptions  Medication Sig Dispense Refill  . carvedilol (COREG) 6.25 MG tablet Take 1 tablet (6.25 mg total) by mouth 2 (two) times daily with a meal. 60 tablet 3  . ferrous sulfate 325 (65 FE) MG tablet Take 325 mg by mouth daily with breakfast.    . isosorbide-hydrALAZINE (BIDIL) 20-37.5 MG per tablet Take 1 tablet by  mouth 3 (three) times daily. 90 tablet 3  . levothyroxine (SYNTHROID, LEVOTHROID) 75 MCG tablet Take 75 mcg by mouth daily.    Marland Kitchen linagliptin (TRADJENTA) 5 MG TABS tablet Take 5 mg by mouth daily.    . niacin (NIASPAN) 1000 MG CR tablet Take 1,000 mg by mouth at bedtime.    . simvastatin (ZOCOR) 20 MG tablet Take 20 mg by mouth daily.    Marland Kitchen torsemide (DEMADEX) 20 MG tablet Take 20 mg by mouth daily.     . Vitamin D, Ergocalciferol, (DRISDOL) 50000 UNITS CAPS capsule Take 1 capsule (50,000 Units total) by mouth every 7 (seven) days. 30 capsule 3   No current facility-administered medications for this visit.   REVIEW OF SYSTEMS: Valu.Nieves ] denotes positive finding; [ ]  denotes negative finding  CARDIOVASCULAR: [ ]  chest pain [ ]  chest pressure [ ]  palpitations [ ]  orthopnea  [ ]  dyspnea on exertion [ ]  claudication [ ]  rest pain [ ]  DVT [ ]  phlebitis PULMONARY: [ ]  productive cough [ ]  asthma [ ]  wheezing NEUROLOGIC: [ ]  weakness [ ]  paresthesias [ ]  aphasia [ ]  amaurosis [ ]  dizziness HEMATOLOGIC: [ ]  bleeding problems [ ]  clotting disorders MUSCULOSKELETAL: [ ]  joint pain [ ]  joint swelling [ ]  leg swelling GASTROINTESTINAL: [ ]  blood in stool [ ]  hematemesis GENITOURINARY: [ ]  dysuria [ ]  hematuria PSYCHIATRIC: [ ]  history of major depression INTEGUMENTARY: [ ]  rashes [ ]  ulcers CONSTITUTIONAL: [ ]  fever [ ]  chills  PHYSICAL EXAM: Filed Vitals:   03/28/15 1159  BP: 168/96  Pulse: 85  Height: 5\' 6"  (1.676 m)  Weight: 217 lb (98.431 kg)  SpO2: 97%   GENERAL: The patient is a well-nourished male, in no acute distress. The vital signs are documented above. CARDIOVASCULAR: There is a regular rate and rhythm. I do not detect carotid bruits. He has palpable radial and brachial pulses bilaterally. PULMONARY: There is good air exchange bilaterally without wheezing or rales. ABDOMEN: Soft  and non-tender with normal pitched bowel sounds.  MUSCULOSKELETAL: There are no major deformities or cyanosis. NEUROLOGIC: No focal weakness or paresthesias are detected. SKIN: There are no ulcers or rashes noted. PSYCHIATRIC: The patient has a normal affect.  DATA:  I have independently interpreted his upper extremity vein mapping. On the right side, the forearm and upper arm cephalic vein looked marginal in size. The basilic vein may potentially be usable. On the left side, the forearm and upper arm cephalic vein are marginal in size. The basilic vein is also marginal in size.  I have independently interpreted his upper extremity arterial Doppler study which shows triphasic radial and ulnar waveforms bilaterally.  MEDICAL ISSUES: STAGE IV CHRONIC KIDNEY DISEASE: His veins are marginal in size and he may not be a candidate for fistula. However I have recommended placement of a left AV fistula or AV graft. I explained the procedure to the patient and his appointed guardian. I have explained the indications for placement of an AV fistula or AV graft. I've explained that if at all possible we will place an AV fistula. I have reviewed the risks of placement of an AV fistula including but not limited to: failure of the fistula to mature, need for subsequent interventions, and thrombosis. In addition I have reviewed the potential complications of placement of an AV graft. These risks include, but are not limited to, graft thrombosis, graft infection, wound healing problems, bleeding, arm swelling, and steal syndrome. All the patient's questions were answered and they are agreeable to proceed with surgery. His surgery is scheduled for 04/10/2015.   [name] [facility] [place]: [phone]

## 2015-07-06 NOTE — Transfer of Care (Signed)
Immediate Anesthesia Transfer of Care Note  Patient: Zachary [name]  Procedure(s) Performed: Procedure(s):  INSERTION LEFT ARM ARTERIOVENOUS GORTEX GRAFT (Left)  Patient Location: PACU  Anesthesia Type:General  Level of Consciousness: awake, alert  and patient cooperative  Airway & Oxygen Therapy: Patient Spontanous Breathing and Patient connected to nasal cannula oxygen  Post-op Assessment: Report given to RN, Post -op Vital signs reviewed and stable and Patient moving all extremities  Post vital signs: Reviewed and stable  Last Vitals:  Filed Vitals:   07/06/15 0949  BP: 168/96  Pulse: 106  Temp: 36.5 C  Resp: 20    Complications: No apparent anesthesia complications

## 2015-07-06 NOTE — Anesthesia Postprocedure Evaluation (Signed)
  Anesthesia Post-op Note  Patient: Zachary [name]  Procedure(s) Performed: Procedure(s):  INSERTION LEFT ARM ARTERIOVENOUS GORTEX GRAFT (Left)  Patient Location: PACU  Anesthesia Type: General   Level of Consciousness: awake, alert  and oriented  Airway and Oxygen Therapy: Patient Spontanous Breathing  Post-op Pain: none  Post-op Assessment: Post-op Vital signs reviewed  Post-op Vital Signs: Reviewed  Last Vitals:  Filed Vitals:   07/06/15 1700  BP: 123/65  Pulse:   Temp:   Resp:     Complications: No apparent anesthesia complications

## 2015-07-06 NOTE — Op Note (Signed)
    NAME: Zachary [name]   MRN: [id number] DOB: Jan 29, 1946    DATE OF OPERATION: 07/06/2015  PREOP DIAGNOSIS: Stage IV chronic kidney disease  POSTOP DIAGNOSIS: Same  PROCEDURE: New left upper arm AV graft (4-7 mm PTFE)  SURGEON: Judeth Cornfield. Scot Dock, MD, FACS  ASSIST: Silva Bandy, [facility]  ANESTHESIA: Gen.   EBL: minimal  INDICATIONS: Zachary [name] is a 69 y.o. male who presents for new access. Vein mapping did not show adequate vein.  FINDINGS: brachial veins were very small. The upper arm cephalic vein was not usable. An upper arm graft was placed.  TECHNIQUE: The patient was taken to the operating room and received a general anesthetic. The left upper extremity was prepped and draped in usual sterile fashion. A transverse incision was made just above the antecubital level. The cephalic vein here was dissected free and was too small to use as a fistula. The brachial artery was dissected free beneath the fascia. The adjacent brachial veins were very small. The basilic vein was small. I elected to place an upper arm graft. A separate longitudinal incision was made beneath the axilla. Here the high brachial vein was dissected free. A tunnel was craved between the 2 incisions and a 4-7 mm PTFE graft was tunneled between the 2 incisions. The patient was heparinized. The brachial artery was clamped proximally and distally and a longitudinal arteriotomy was made. A segment of the 4 mm end of the graft was excised, the graft slightly spatulated, and sewn end to side to the artery using continuous 60 proline suture. The graft wasn't appropriate length for anastomosis to the high brachial vein. The vein was ligated distally and spatulated proximal. The graft was cut the appropriate length, spatulated, and sewn into into the vein using continuous 60 proline suture. At the completion was a good thrill in the graft and a palpable radial pulse. The heparin was partially reversed with protamine. The wounds were  closed with 3-0 Vicryl and the skin closed with 4-0 vicryl. Liquiband was applied. The patient tolerated the procedure well and transferred to the recovery room in stable condition. All needle and sponge counts were correct.  [name], MD, FACS [facility]ists of [facility]  DATE OF DICTATION:   07/06/2015

## 2015-07-07 ENCOUNTER — Encounter (HOSPITAL_COMMUNITY): Payer: Self-pay | Admitting: Emergency Medicine

## 2015-07-07 ENCOUNTER — Emergency Department (HOSPITAL_COMMUNITY)
Admission: EM | Admit: 2015-07-07 | Discharge: 2015-07-07 | Disposition: A | Discharge status: Home / Self Care | Payer: Medicare Other | Source: Home / Self Care | Attending: Emergency Medicine | Admitting: Emergency Medicine

## 2015-07-07 DIAGNOSIS — R112 Nausea with vomiting, unspecified: Secondary | ICD-10-CM

## 2015-07-07 LAB — URINALYSIS, ROUTINE W REFLEX MICROSCOPIC
Bilirubin Urine: NEGATIVE
Glucose, UA: NEGATIVE mg/dL
Leukocytes, UA: NEGATIVE
Nitrite: NEGATIVE
Protein, ur: >300 mg/dL — AB
Specific Gravity, Urine: >1.03 — ABNORMAL HIGH (ref 1.005–1.030)
Urobilinogen, UA: 0.2 mg/dL (ref 0.0–1.0)
pH: 5.5 (ref 5.0–8.0)

## 2015-07-07 LAB — CBC WITH DIFFERENTIAL/PLATELET
Basophils Absolute: 0 10*3/uL (ref 0.0–0.1)
Basophils Relative: 0 % (ref 0–1)
Eosinophils Absolute: 0.3 10*3/uL (ref 0.0–0.7)
Eosinophils Relative: 3 % (ref 0–5)
HCT: 29.2 % — ABNORMAL LOW (ref 39.0–52.0)
Hemoglobin: 9.6 g/dL — ABNORMAL LOW (ref 13.0–17.0)
Lymphocytes Relative: 14 % (ref 12–46)
Lymphs Abs: 1.6 10*3/uL (ref 0.7–4.0)
MCH: 34.7 pg — ABNORMAL HIGH (ref 26.0–34.0)
MCHC: 32.9 g/dL (ref 30.0–36.0)
MCV: 105.4 fL — ABNORMAL HIGH (ref 78.0–100.0)
Monocytes Absolute: 0.7 10*3/uL (ref 0.1–1.0)
Monocytes Relative: 6 % (ref 3–12)
Neutro Abs: 8.8 10*3/uL — ABNORMAL HIGH (ref 1.7–7.7)
Neutrophils Relative %: 77 % (ref 43–77)
Platelets: 139 10*3/uL — ABNORMAL LOW (ref 150–400)
RBC: 2.77 MIL/uL — ABNORMAL LOW (ref 4.22–5.81)
RDW: 17.4 % — ABNORMAL HIGH (ref 11.5–15.5)
WBC: 11.4 10*3/uL — ABNORMAL HIGH (ref 4.0–10.5)

## 2015-07-07 LAB — BASIC METABOLIC PANEL
Anion gap: 12 (ref 5–15)
BUN: 60 mg/dL — ABNORMAL HIGH (ref 6–20)
CO2: 24 mmol/L (ref 22–32)
Calcium: 9.2 mg/dL (ref 8.9–10.3)
Chloride: 114 mmol/L — ABNORMAL HIGH (ref 101–111)
Creatinine, Ser: 4.37 mg/dL — ABNORMAL HIGH (ref 0.61–1.24)
GFR calc Af Amer: 15 mL/min — ABNORMAL LOW (ref >60)
GFR calc non Af Amer: 13 mL/min — ABNORMAL LOW (ref >60)
Glucose, Bld: 166 mg/dL — ABNORMAL HIGH (ref 65–99)
Potassium: 4.4 mmol/L (ref 3.5–5.1)
Sodium: 150 mmol/L — ABNORMAL HIGH (ref 135–145)

## 2015-07-07 LAB — URINE MICROSCOPIC-ADD ON

## 2015-07-07 MED ORDER — ONDANSETRON HCL 4 MG/2ML IJ SOLN
INTRAMUSCULAR | Status: AC
  Filled 2015-07-07: qty 2

## 2015-07-07 MED ORDER — ONDANSETRON HCL 4 MG/2ML IJ SOLN
4.0000 mg | Freq: Once | INTRAMUSCULAR | Status: AC
  Administered 2015-07-07: 4 mg via INTRAVENOUS
  Filled 2015-07-07: qty 2

## 2015-07-07 MED ORDER — HYDROMORPHONE HCL 1 MG/ML IJ SOLN
1.0000 mg | Freq: Once | INTRAMUSCULAR | Status: AC
Start: 2015-07-07 — End: 2015-07-07
  Administered 2015-07-07: 1 mg via INTRAVENOUS
  Filled 2015-07-07: qty 1

## 2015-07-07 MED ORDER — ONDANSETRON HCL 4 MG PO TABS: 4.0000 mg | ORAL_TABLET | Freq: Four times a day (QID) | ORAL | Status: DC | PRN

## 2015-07-07 MED ORDER — ONDANSETRON HCL 4 MG/2ML IJ SOLN
4.0000 mg | Freq: Once | INTRAMUSCULAR | Status: AC
  Administered 2015-07-07: 4 mg via INTRAVENOUS

## 2015-07-07 MED ORDER — SODIUM CHLORIDE 0.9 % IV BOLUS (SEPSIS)
1000.0000 mL | Freq: Once | INTRAVENOUS | Status: AC
  Administered 2015-07-07: 1000 mL via INTRAVENOUS

## 2015-07-07 NOTE — ED Provider Notes (Signed)
CSN: [id number]     Arrival date & time 07/07/15  1013 History  This chart was scribed for Zachary Manifold, MD by [name], ED Scribe. This patient was seen in room APA03/APA03 and the patient's care was started at 10:35 AM.    Chief Complaint  Patient presents with  . Emesis      The history is provided by the patient. No language interpreter was used.   HPI Comments: Zachary [name] is a 69 y.o. male who presents to the Emergency Department complaining of constant, gradual worsening vomiting onset one week ago. Pt is from [place] and just recently had bladder surgery and just had a fistula put in last week. Per triage note, pt has been having vomiting, but while talking to provider, he denies it. Pt further denies sick contacts, pain, and hematemesis.  Past Medical History  Diagnosis Date  . Hypertension   . Diabetes mellitus   . Smoking   . Abnormal CT scan, kidney 10/06/2011  . UTI (lower urinary tract infection) 10/06/2011  . Bladder wall thickening 10/06/2011  . 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 Poor historian poor historian  . BPH (benign prostatic hypertrophy)   . Anxiety     mental retardation  . Hypothyroidism   . MR (mental retardation)   . Acute pyelonephritis 10/07/2011  . Hyperkalemia   . Renal insufficiency     chronic history  . Renal failure (ARF), acute on chronic   . Anemia     normocytic  . Edema      history of lower extremity edema  . Hyperlipidemia   . Impaired speech    Past Surgical History  Procedure Laterality Date  . Cystoscopy with urethral dilatation N/A 12/29/2013    Procedure: CYSTOSCOPY WITH URETHRAL DILATATION;  Surgeon: Marissa Nestle, MD;  Location: [facility];  Service: Urology;  Laterality: N/A;  . Transurethral resection of prostate N/A 01/04/2014    Procedure: TRANSURETHRAL RESECTION OF THE PROSTATE (TURP) (procedure #2);  Surgeon: Marissa Nestle, MD;  Location: [facility];  Service: Urology;  Laterality: N/A;  . Circumcision N/A  01/04/2014    Procedure: CIRCUMCISION ADULT (procedure #1);  Surgeon: Marissa Nestle, MD;  Location: [facility];  Service: Urology;  Laterality: N/A;  . Cystoscopy w/ retrogrades Bilateral 06/29/2015    Procedure: CYSTOSCOPY, DILATION OF URETHRAL STRICTURE WITH BILATERAL RETROGRADE PYELOGRAM,SUPRAPUBIC TUBE CHANGE;  Surgeon: Festus Aloe, MD;  Location: [facility];  Service: Urology;  Laterality: Bilateral;   Family History  Problem Relation Age of Onset  . Cancer Mother    Social History  Substance Use Topics  . Smoking status: Never Smoker   . Smokeless tobacco: Never Used  . Alcohol Use: No     Comment: occ. use     Review of Systems  Gastrointestinal: Positive for vomiting. Negative for diarrhea.  All other systems reviewed and are negative.     Allergies  Review of patient's allergies indicates no known allergies.  Home Medications   Prior to Admission medications   Medication Sig Start Date End Date Taking? Authorizing Provider  amoxicillin-clavulanate (AUGMENTIN) 875-125 MG per tablet Take 1 tablet by mouth 2 (two) times daily. 06/29/15   [name], MD  carvedilol (COREG) 6.25 MG tablet Take 1 tablet (6.25 mg total) by mouth 2 (two) times daily with a meal. 03/08/15   [name], MD  ferrous sulfate 325 (65 FE) MG tablet Take 325 mg by mouth daily with breakfast.    Historical  Provider, MD  HYDROcodone-acetaminophen (NORCO/VICODIN) 5-325 MG per tablet Take 1 tablet by mouth every 6 (six) hours as needed for moderate pain. 07/06/15   [name], PA-C  isosorbide-hydrALAZINE (BIDIL) 20-37.5 MG per tablet Take 1 tablet by mouth 3 (three) times daily. Patient taking differently: Take 1 tablet by mouth 3 (three) times daily. 8am,2pm,8pm 03/08/15   [name], MD  levothyroxine (SYNTHROID, LEVOTHROID) 75 MCG tablet Take 75 mcg by mouth daily before breakfast.     Historical Provider, MD  linagliptin (TRADJENTA) 5 MG TABS tablet Take 5 mg by mouth daily.    Historical  Provider, MD  niacin (NIASPAN) 1000 MG CR tablet Take 1,000 mg by mouth at bedtime.    Historical Provider, MD  simvastatin (ZOCOR) 20 MG tablet Take 20 mg by mouth at bedtime.  02/28/15   Historical Provider, MD  sodium polystyrene (KAYEXALATE) 15 GM/60ML suspension Take 30 g by mouth once a week. Drink the entire contents of one bottle (143ml/30g) once weekly on Mondays    Historical Provider, MD  torsemide (DEMADEX) 20 MG tablet Take 20 mg by mouth. Daily at 8am 06/13/15   Historical Provider, MD  Vitamin D, Ergocalciferol, (DRISDOL) 50000 UNITS CAPS capsule Take 1 capsule (50,000 Units total) by mouth every 7 (seven) days. Patient taking differently: Take 50,000 Units by mouth every 7 (seven) days. On Monday 03/08/15   [name], MD   Triage vitals: BP 133/86 mmHg  Pulse 115  Temp(Src) 98 F (36.7 C) (Oral)  Resp 20  Wt 184 lb (83.462 kg)  SpO2 100% Physical Exam  Constitutional: He is oriented to person, place, and time. He appears well-developed and well-nourished.  HENT:  Head: Normocephalic.  Eyes: EOM are normal.  Neck: Normal range of motion.  Cardiovascular: Normal rate and regular rhythm.   Pulmonary/Chest: Effort normal.  Abdominal: He exhibits no distension. There is tenderness.  Upper abdominal tenderness  Musculoskeletal: Normal range of motion.  Surgical site LUE looks appropriate giving time of procedure, graft has palpable thrill  Neurological: He is alert and oriented to person, place, and time.  Psychiatric: He has a normal mood and affect.  Nursing note and vitals reviewed.   ED Course  Procedures  DIAGNOSTIC STUDIES: Oxygen Saturation is 100% on RA, normal by my interpretation.  COORDINATION OF CARE:  10:38 AM Discussed treatment plan which includes nausea and pain medication with pt at bedside and pt agreed to plan.  Labs Review Labs Reviewed  CBC WITH DIFFERENTIAL/PLATELET - Abnormal; Notable for the following:    WBC 11.4 (*)    RBC 2.77 (*)     Hemoglobin 9.6 (*)    HCT 29.2 (*)    MCV 105.4 (*)    MCH 34.7 (*)    RDW 17.4 (*)    Platelets 139 (*)    Neutro Abs 8.8 (*)    All other components within normal limits  BASIC METABOLIC PANEL - Abnormal; Notable for the following:    Sodium 150 (*)    Chloride 114 (*)    Glucose, Bld 166 (*)    BUN 60 (*)    Creatinine, Ser 4.37 (*)    GFR calc non Af Amer 13 (*)    GFR calc Af Amer 15 (*)    All other components within normal limits  URINALYSIS, ROUTINE W REFLEX MICROSCOPIC (NOT AT [facility]) - Abnormal; Notable for the following:    Specific Gravity, Urine >1.030 (*)    Hgb urine dipstick SMALL (*)  Ketones, ur TRACE (*)    Protein, ur >300 (*)    All other components within normal limits  URINE MICROSCOPIC-ADD ON - Abnormal; Notable for the following:    Bacteria, UA FEW (*)    All other components within normal limits    Imaging Review No results found. I have personally reviewed and evaluated these images and lab results as part of my medical decision-making.   EKG Interpretation None      MDM   Final diagnoses:  Non-intractable vomiting with nausea, vomiting of unspecified type    68yM with n/v. Improved with zofran. IVF given. It has been determined that no acute conditions requiring further emergency intervention are present at this time. The patient has been advised of the diagnosis and plan. I reviewed any labs and imaging including any potential incidental findings. We have discussed signs and symptoms that warrant return to the ED and they are listed in the discharge instructions.   I personally preformed the services scribed in my presence. The recorded information has been reviewed is accurate. Zachary Manifold, MD.   Zachary Manifold, MD 07/20/15 [phone]

## 2015-07-07 NOTE — ED Notes (Signed)
Pt from [place], recent bladder surgery, Pt has been vomiting and diarrhea x 1week

## 2015-07-07 NOTE — ED Notes (Signed)
Spoke with Sunday Spillers, RN at [facility] report given, all questions answered. Transport en route to pick up patient.

## 2015-07-07 NOTE — Discharge Instructions (Signed)

## 2015-07-08 ENCOUNTER — Other Ambulatory Visit: Payer: Self-pay

## 2015-07-08 ENCOUNTER — Inpatient Hospital Stay (HOSPITAL_COMMUNITY)
Admission: EM | Admit: 2015-07-08 | Discharge: 2015-07-13 | DRG: 673 | Disposition: A | Discharge status: Other Acute Inpatient Hospital | Payer: Medicare Other | Attending: Internal Medicine | Admitting: Internal Medicine

## 2015-07-08 ENCOUNTER — Encounter (HOSPITAL_COMMUNITY): Payer: Self-pay | Admitting: Emergency Medicine

## 2015-07-08 ENCOUNTER — Inpatient Hospital Stay (HOSPITAL_COMMUNITY): Payer: Medicare Other

## 2015-07-08 DIAGNOSIS — N170 Acute kidney failure with tubular necrosis: Secondary | ICD-10-CM | POA: Diagnosis not present

## 2015-07-08 DIAGNOSIS — N179 Acute kidney failure, unspecified: Secondary | ICD-10-CM | POA: Diagnosis not present

## 2015-07-08 DIAGNOSIS — E785 Hyperlipidemia, unspecified: Secondary | ICD-10-CM | POA: Diagnosis not present

## 2015-07-08 DIAGNOSIS — E43 Unspecified severe protein-calorie malnutrition: Secondary | ICD-10-CM | POA: Diagnosis present

## 2015-07-08 DIAGNOSIS — D649 Anemia, unspecified: Secondary | ICD-10-CM | POA: Diagnosis not present

## 2015-07-08 DIAGNOSIS — I129 Hypertensive chronic kidney disease with stage 1 through stage 4 chronic kidney disease, or unspecified chronic kidney disease: Secondary | ICD-10-CM | POA: Diagnosis present

## 2015-07-08 DIAGNOSIS — R112 Nausea with vomiting, unspecified: Secondary | ICD-10-CM | POA: Diagnosis not present

## 2015-07-08 DIAGNOSIS — N132 Hydronephrosis with renal and ureteral calculous obstruction: Secondary | ICD-10-CM | POA: Diagnosis not present

## 2015-07-08 DIAGNOSIS — E875 Hyperkalemia: Secondary | ICD-10-CM | POA: Diagnosis not present

## 2015-07-08 DIAGNOSIS — N184 Chronic kidney disease, stage 4 (severe): Secondary | ICD-10-CM

## 2015-07-08 DIAGNOSIS — R1110 Vomiting, unspecified: Secondary | ICD-10-CM | POA: Diagnosis not present

## 2015-07-08 DIAGNOSIS — E1165 Type 2 diabetes mellitus with hyperglycemia: Secondary | ICD-10-CM | POA: Diagnosis not present

## 2015-07-08 DIAGNOSIS — R933 Abnormal findings on diagnostic imaging of other parts of digestive tract: Secondary | ICD-10-CM | POA: Insufficient documentation

## 2015-07-08 DIAGNOSIS — E1122 Type 2 diabetes mellitus with diabetic chronic kidney disease: Secondary | ICD-10-CM | POA: Diagnosis present

## 2015-07-08 DIAGNOSIS — Z6826 Body mass index (BMI) 26.0-26.9, adult: Secondary | ICD-10-CM | POA: Diagnosis not present

## 2015-07-08 DIAGNOSIS — R110 Nausea: Secondary | ICD-10-CM | POA: Diagnosis not present

## 2015-07-08 DIAGNOSIS — E870 Hyperosmolality and hypernatremia: Secondary | ICD-10-CM | POA: Diagnosis not present

## 2015-07-08 DIAGNOSIS — I10 Essential (primary) hypertension: Secondary | ICD-10-CM | POA: Diagnosis present

## 2015-07-08 DIAGNOSIS — R000 Tachycardia, unspecified: Secondary | ICD-10-CM | POA: Diagnosis not present

## 2015-07-08 DIAGNOSIS — N401 Enlarged prostate with lower urinary tract symptoms: Secondary | ICD-10-CM | POA: Diagnosis not present

## 2015-07-08 DIAGNOSIS — R195 Other fecal abnormalities: Secondary | ICD-10-CM | POA: Diagnosis not present

## 2015-07-08 DIAGNOSIS — E860 Dehydration: Secondary | ICD-10-CM | POA: Diagnosis present

## 2015-07-08 DIAGNOSIS — D638 Anemia in other chronic diseases classified elsewhere: Secondary | ICD-10-CM | POA: Diagnosis not present

## 2015-07-08 DIAGNOSIS — D640 Hereditary sideroblastic anemia: Secondary | ICD-10-CM | POA: Diagnosis not present

## 2015-07-08 DIAGNOSIS — E1129 Type 2 diabetes mellitus with other diabetic kidney complication: Secondary | ICD-10-CM | POA: Diagnosis not present

## 2015-07-08 DIAGNOSIS — E039 Hypothyroidism, unspecified: Secondary | ICD-10-CM | POA: Diagnosis present

## 2015-07-08 DIAGNOSIS — D509 Iron deficiency anemia, unspecified: Secondary | ICD-10-CM | POA: Diagnosis present

## 2015-07-08 DIAGNOSIS — Z809 Family history of malignant neoplasm, unspecified: Secondary | ICD-10-CM | POA: Diagnosis not present

## 2015-07-08 DIAGNOSIS — F79 Unspecified intellectual disabilities: Secondary | ICD-10-CM

## 2015-07-08 DIAGNOSIS — K922 Gastrointestinal hemorrhage, unspecified: Secondary | ICD-10-CM | POA: Diagnosis not present

## 2015-07-08 DIAGNOSIS — N19 Unspecified kidney failure: Secondary | ICD-10-CM | POA: Diagnosis not present

## 2015-07-08 LAB — CBC
HCT: 31.1 % — ABNORMAL LOW (ref 39.0–52.0)
Hemoglobin: 10.2 g/dL — ABNORMAL LOW (ref 13.0–17.0)
MCH: 34.7 pg — ABNORMAL HIGH (ref 26.0–34.0)
MCHC: 32.8 g/dL (ref 30.0–36.0)
MCV: 105.8 fL — ABNORMAL HIGH (ref 78.0–100.0)
Platelets: 147 10*3/uL — ABNORMAL LOW (ref 150–400)
RBC: 2.94 MIL/uL — ABNORMAL LOW (ref 4.22–5.81)
RDW: 17.3 % — ABNORMAL HIGH (ref 11.5–15.5)
WBC: 12.4 10*3/uL — ABNORMAL HIGH (ref 4.0–10.5)

## 2015-07-08 LAB — COMPREHENSIVE METABOLIC PANEL
ALT: 13 U/L — ABNORMAL LOW (ref 17–63)
AST: 21 U/L (ref 15–41)
Albumin: 4.1 g/dL (ref 3.5–5.0)
Alkaline Phosphatase: 41 U/L (ref 38–126)
Anion gap: 14 (ref 5–15)
BUN: 62 mg/dL — ABNORMAL HIGH (ref 6–20)
CO2: 27 mmol/L (ref 22–32)
Calcium: 9.4 mg/dL (ref 8.9–10.3)
Chloride: 112 mmol/L — ABNORMAL HIGH (ref 101–111)
Creatinine, Ser: 4.87 mg/dL — ABNORMAL HIGH (ref 0.61–1.24)
GFR calc Af Amer: 13 mL/min — ABNORMAL LOW (ref >60)
GFR calc non Af Amer: 11 mL/min — ABNORMAL LOW (ref >60)
Glucose, Bld: 204 mg/dL — ABNORMAL HIGH (ref 65–99)
Potassium: 4 mmol/L (ref 3.5–5.1)
Sodium: 153 mmol/L — ABNORMAL HIGH (ref 135–145)
Total Bilirubin: 0.9 mg/dL (ref 0.3–1.2)
Total Protein: 7.8 g/dL (ref 6.5–8.1)

## 2015-07-08 LAB — GLUCOSE, CAPILLARY
Glucose-Capillary: 150 mg/dL — ABNORMAL HIGH (ref 65–99)
Glucose-Capillary: 260 mg/dL — ABNORMAL HIGH (ref 65–99)

## 2015-07-08 LAB — TROPONIN I: Troponin I: <0.03 ng/mL (ref <0.031)

## 2015-07-08 LAB — LIPASE, BLOOD: Lipase: 61 U/L — ABNORMAL HIGH (ref 22–51)

## 2015-07-08 MED ORDER — METOCLOPRAMIDE HCL 5 MG/ML IJ SOLN
5.0000 mg | Freq: Four times a day (QID) | INTRAMUSCULAR | Status: DC
  Administered 2015-07-08 – 2015-07-13 (×18): 5 mg via INTRAVENOUS
  Filled 2015-07-08 (×18): qty 2

## 2015-07-08 MED ORDER — CARVEDILOL 3.125 MG PO TABS
6.2500 mg | ORAL_TABLET | Freq: Two times a day (BID) | ORAL | Status: DC
  Administered 2015-07-08 – 2015-07-13 (×10): 6.25 mg via ORAL
  Filled 2015-07-08 (×11): qty 2

## 2015-07-08 MED ORDER — IOHEXOL 300 MG/ML  SOLN: 25.0000 mL | Freq: Once | INTRAMUSCULAR | Status: DC | PRN

## 2015-07-08 MED ORDER — ONDANSETRON HCL 4 MG PO TABS: 4.0000 mg | ORAL_TABLET | Freq: Four times a day (QID) | ORAL | Status: DC | PRN

## 2015-07-08 MED ORDER — INSULIN ASPART 100 UNIT/ML ~~LOC~~ SOLN
0.0000 [IU] | Freq: Three times a day (TID) | SUBCUTANEOUS | Status: DC
Start: 2015-07-08 — End: 2015-07-13
  Administered 2015-07-08: 2 [IU] via SUBCUTANEOUS
  Administered 2015-07-09: 8 [IU] via SUBCUTANEOUS
  Administered 2015-07-09 – 2015-07-10 (×4): 3 [IU] via SUBCUTANEOUS
  Administered 2015-07-10: 2 [IU] via SUBCUTANEOUS
  Administered 2015-07-11: 3 [IU] via SUBCUTANEOUS
  Administered 2015-07-11 – 2015-07-12 (×2): 2 [IU] via SUBCUTANEOUS

## 2015-07-08 MED ORDER — LEVOTHYROXINE SODIUM 75 MCG PO TABS
75.0000 ug | ORAL_TABLET | Freq: Every day | ORAL | Status: DC
  Administered 2015-07-09 – 2015-07-13 (×5): 75 ug via ORAL
  Filled 2015-07-08 (×5): qty 1

## 2015-07-08 MED ORDER — ACETAMINOPHEN 325 MG PO TABS: 650.0000 mg | ORAL_TABLET | Freq: Four times a day (QID) | ORAL | Status: DC | PRN

## 2015-07-08 MED ORDER — SODIUM CHLORIDE 0.9 % IJ SOLN
3.0000 mL | Freq: Two times a day (BID) | INTRAMUSCULAR | Status: DC
  Administered 2015-07-09 – 2015-07-12 (×3): 3 mL via INTRAVENOUS

## 2015-07-08 MED ORDER — SODIUM CHLORIDE 0.9 % IV BOLUS (SEPSIS): 2000.0000 mL | Freq: Once | INTRAVENOUS | Status: DC

## 2015-07-08 MED ORDER — SIMVASTATIN 20 MG PO TABS
20.0000 mg | ORAL_TABLET | Freq: Every day | ORAL | Status: DC
  Administered 2015-07-08 – 2015-07-12 (×5): 20 mg via ORAL
  Filled 2015-07-08 (×5): qty 1

## 2015-07-08 MED ORDER — ISOSORB DINITRATE-HYDRALAZINE 20-37.5 MG PO TABS
ORAL_TABLET | ORAL | Status: AC
  Filled 2015-07-08: qty 1

## 2015-07-08 MED ORDER — ENSURE ENLIVE PO LIQD
237.0000 mL | Freq: Two times a day (BID) | ORAL | Status: DC
  Administered 2015-07-09: 237 mL via ORAL

## 2015-07-08 MED ORDER — INSULIN ASPART 100 UNIT/ML ~~LOC~~ SOLN
0.0000 [IU] | Freq: Every day | SUBCUTANEOUS | Status: DC
  Administered 2015-07-08: 3 [IU] via SUBCUTANEOUS

## 2015-07-08 MED ORDER — ONDANSETRON HCL 4 MG/2ML IJ SOLN: 4.0000 mg | Freq: Four times a day (QID) | INTRAMUSCULAR | Status: DC | PRN

## 2015-07-08 MED ORDER — HYDROCODONE-ACETAMINOPHEN 5-325 MG PO TABS: 1.0000 | ORAL_TABLET | Freq: Four times a day (QID) | ORAL | Status: DC | PRN

## 2015-07-08 MED ORDER — SODIUM CHLORIDE 0.9 % IV SOLN
INTRAVENOUS | Status: DC
  Administered 2015-07-08: 14:00:00 via INTRAVENOUS

## 2015-07-08 MED ORDER — ENOXAPARIN SODIUM 30 MG/0.3ML ~~LOC~~ SOLN
30.0000 mg | SUBCUTANEOUS | Status: DC
  Administered 2015-07-08 – 2015-07-11 (×4): 30 mg via SUBCUTANEOUS
  Filled 2015-07-08 (×4): qty 0.3

## 2015-07-08 MED ORDER — METOCLOPRAMIDE HCL 5 MG/ML IJ SOLN: 10.0000 mg | Freq: Four times a day (QID) | INTRAMUSCULAR | Status: DC

## 2015-07-08 MED ORDER — BOOST / RESOURCE BREEZE PO LIQD
1.0000 | Freq: Three times a day (TID) | ORAL | Status: DC
  Administered 2015-07-08 – 2015-07-13 (×7): 1 via ORAL

## 2015-07-08 MED ORDER — ISOSORB DINITRATE-HYDRALAZINE 20-37.5 MG PO TABS
1.0000 | ORAL_TABLET | Freq: Three times a day (TID) | ORAL | Status: DC
  Administered 2015-07-08 – 2015-07-13 (×14): 1 via ORAL
  Filled 2015-07-08 (×25): qty 1

## 2015-07-08 MED ORDER — SODIUM CHLORIDE 0.9 % IV BOLUS (SEPSIS)
2000.0000 mL | Freq: Once | INTRAVENOUS | Status: AC
  Administered 2015-07-08: 2000 mL via INTRAVENOUS

## 2015-07-08 MED ORDER — DEXTROSE-NACL 5-0.45 % IV SOLN
INTRAVENOUS | Status: DC
  Administered 2015-07-08 – 2015-07-13 (×9): via INTRAVENOUS

## 2015-07-08 MED ORDER — ACETAMINOPHEN 650 MG RE SUPP: 650.0000 mg | Freq: Four times a day (QID) | RECTAL | Status: DC | PRN

## 2015-07-08 MED ORDER — INFLUENZA VAC SPLIT QUAD 0.5 ML IM SUSY
0.5000 mL | PREFILLED_SYRINGE | INTRAMUSCULAR | Status: AC
  Administered 2015-07-09: 0.5 mL via INTRAMUSCULAR
  Filled 2015-07-08: qty 0.5

## 2015-07-08 NOTE — Progress Notes (Signed)
[place] call from pharmacist at [facility] regarding patient's Reglan order. The recommendation for Reglan IV for patient is Reglan 5mg  IV QID instead of 10mg  d/t patient's renal clearance. MD notified, new order given to change Reglan order to recommended dosing of Reglan 5mg  IV QID.

## 2015-07-08 NOTE — ED Provider Notes (Signed)
CSN: [id number]     Arrival date & time 07/08/15  1114 History   First MD Initiated Contact with Patient 07/08/15 1156     Chief Complaint  Patient presents with  . Emesis     (Consider location/radiation/quality/duration/timing/severity/associated sxs/prior Treatment) HPI L5 caveat mental retardation. History is obtained from records accompanying patient and from old records and [place] at assisted living facility, [place] via telephone.  Patient has had diminished appetite since he had urinary catheter changed 9 days ago. He had a dialysis fistula placed into his left upper arm 2 days ago. For the past 2 days he's had vomiting and unable to keep anything down without vomiting. He was seen here yesterday and released prescribe Zofran. He was given Zofran this morning, continued to vomit. No known fever. He denies pain anywhere. No other associated symptoms. Presently he offers no complaint except feels thirsty. Asking for water. Past Medical History  Diagnosis Date  . Hypertension   . Diabetes mellitus   . Smoking   . Abnormal CT scan, kidney 10/06/2011  . UTI (lower urinary tract infection) 10/06/2011  . Bladder wall thickening 10/06/2011  . 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 Poor historian poor historian  . BPH (benign prostatic hypertrophy)   . Anxiety     mental retardation  . Hypothyroidism   . MR (mental retardation)   . Acute pyelonephritis 10/07/2011  . Hyperkalemia   . Renal insufficiency     chronic history  . Renal failure (ARF), acute on chronic   . Anemia     normocytic  . Edema      history of lower extremity edema  . Hyperlipidemia   . Impaired speech    Past Surgical History  Procedure Laterality Date  . Cystoscopy with urethral dilatation N/A 12/29/2013    Procedure: CYSTOSCOPY WITH URETHRAL DILATATION;  Surgeon: Marissa Nestle, MD;  Location: [facility];  Service: Urology;  Laterality: N/A;  . Transurethral resection of prostate N/A  01/04/2014    Procedure: TRANSURETHRAL RESECTION OF THE PROSTATE (TURP) (procedure #2);  Surgeon: Marissa Nestle, MD;  Location: [facility];  Service: Urology;  Laterality: N/A;  . Circumcision N/A 01/04/2014    Procedure: CIRCUMCISION ADULT (procedure #1);  Surgeon: Marissa Nestle, MD;  Location: [facility];  Service: Urology;  Laterality: N/A;  . Cystoscopy w/ retrogrades Bilateral 06/29/2015    Procedure: CYSTOSCOPY, DILATION OF URETHRAL STRICTURE WITH BILATERAL RETROGRADE PYELOGRAM,SUPRAPUBIC TUBE CHANGE;  Surgeon: Festus Aloe, MD;  Location: [facility];  Service: Urology;  Laterality: Bilateral;   Family History  Problem Relation Age of Onset  . Cancer Mother    Social History  Substance Use Topics  . Smoking status: Never Smoker   . Smokeless tobacco: Never Used  . Alcohol Use: No     Comment: occ. use     Review of Systems  Unable to perform ROS: Dementia  Gastrointestinal: Positive for vomiting.      Allergies  Review of patient's allergies indicates no known allergies.  Home Medications   Prior to Admission medications   Medication Sig Start Date End Date Taking? Authorizing Provider  carvedilol (COREG) 6.25 MG tablet Take 1 tablet (6.25 mg total) by mouth 2 (two) times daily with a meal. 03/08/15  Yes [name], MD  ferrous sulfate 325 (65 FE) MG tablet Take 325 mg by mouth daily with breakfast.   Yes Historical Provider, MD  HYDROcodone-acetaminophen (NORCO/VICODIN) 5-325 MG per tablet Take 1 tablet by mouth every  6 (six) hours as needed for moderate pain. 07/06/15  Yes [name], PA-C  isosorbide-hydrALAZINE (BIDIL) 20-37.5 MG per tablet Take 1 tablet by mouth 3 (three) times daily. Patient taking differently: Take 1 tablet by mouth 3 (three) times daily. 8am,2pm,8pm 03/08/15  Yes [name], MD  levothyroxine (SYNTHROID, LEVOTHROID) 75 MCG tablet Take 75 mcg by mouth daily before breakfast.    Yes Historical Provider, MD  linagliptin (TRADJENTA) 5 MG TABS  tablet Take 5 mg by mouth daily.   Yes Historical Provider, MD  niacin (NIASPAN) 1000 MG CR tablet Take 1,000 mg by mouth at bedtime.   Yes Historical Provider, MD  simvastatin (ZOCOR) 20 MG tablet Take 20 mg by mouth at bedtime.  02/28/15  Yes Historical Provider, MD  sodium polystyrene (KAYEXALATE) 15 GM/60ML suspension Take 30 g by mouth once a week. Drink the entire contents of one bottle (145ml/30g) once weekly on Mondays   Yes Historical Provider, MD  torsemide (DEMADEX) 20 MG tablet Take 20 mg by mouth. Daily at 8am 06/13/15  Yes Historical Provider, MD  Vitamin D, Ergocalciferol, (DRISDOL) 50000 UNITS CAPS capsule Take 1 capsule (50,000 Units total) by mouth every 7 (seven) days. Patient taking differently: Take 50,000 Units by mouth every 7 (seven) days. On Monday 03/08/15  Yes [name], MD  amoxicillin-clavulanate (AUGMENTIN) 875-125 MG per tablet Take 1 tablet by mouth 2 (two) times daily. Patient not taking: Reported on 07/07/2015 06/29/15   [name], MD  ondansetron (ZOFRAN) 4 MG tablet Take 1 tablet (4 mg total) by mouth 4 (four) times daily as needed for nausea or vomiting. Patient not taking: Reported on 07/08/2015 07/07/15   [name], MD   BP 164/92 mmHg  Pulse 122  Temp(Src) 97.5 F (36.4 C) (Oral)  Resp 14  Ht 6' (1.829 m)  Wt 184 lb (83.462 kg)  BMI 24.95 kg/m2  SpO2 100% Physical Exam  Constitutional:  Chronically ill-appearing  HENT:  Head: Normocephalic and atraumatic.  Mucous membranes dry  Eyes: Conjunctivae are normal. Pupils are equal, round, and reactive to light.  Neck: Neck supple. No tracheal deviation present. No thyromegaly present.  Cardiovascular: Regular rhythm.   No murmur heard. Tachycardic  Pulmonary/Chest: Effort normal and breath sounds normal.  Abdominal: Soft. Bowel sounds are normal. He exhibits no distension. There is no tenderness.  Musculoskeletal: Normal range of motion. He exhibits no edema or tenderness.  Fistula left upper  arm nontender no redness good thrill  Neurological: He is alert. Coordination normal.  Skin: Skin is warm and dry. No rash noted.  Psychiatric: He has a normal mood and affect.  Nursing note and vitals reviewed.   ED Course  Procedures (including critical care time) Labs Review Labs Reviewed  LIPASE, BLOOD  COMPREHENSIVE METABOLIC PANEL  CBC    Imaging Review No results found. I have personally reviewed and evaluated these images and lab results as part of my medical decision-making.   EKG Interpretation None     ED ECG REPORT   Date: 07/08/2015  Rate: 120  Rhyth120sinus tachycardia  QRS Axis: left  Intervals: normal  ST/T Wave abnormalities: nonspecific T wave changes  Conduction Disutrbances:none  Narrative Interpretation:   Old EKG Reviewed: Rate faster than 06/14/2015 otherwise no significant change  I have personally reviewed the EKG tracing and disagree with the computerized printout as noted. Doubt inferior injury Results for orders placed or performed during the hospital encounter of 07/08/15  Lipase, blood  Result Value Ref Range  Lipase 61 (H) 22 - 51 U/L  Comprehensive metabolic panel  Result Value Ref Range   Sodium 153 (H) 135 - 145 mmol/L   Potassium 4.0 3.5 - 5.1 mmol/L   Chloride 112 (H) 101 - 111 mmol/L   CO2 27 22 - 32 mmol/L   Glucose, Bld 204 (H) 65 - 99 mg/dL   BUN 62 (H) 6 - 20 mg/dL   Creatinine, Ser 4.87 (H) 0.61 - 1.24 mg/dL   Calcium 9.4 8.9 - 10.3 mg/dL   Total Protein 7.8 6.5 - 8.1 g/dL   Albumin 4.1 3.5 - 5.0 g/dL   AST 21 15 - 41 U/L   ALT 13 (L) 17 - 63 U/L   Alkaline Phosphatase 41 38 - 126 U/L   Total Bilirubin 0.9 0.3 - 1.2 mg/dL   GFR calc non Af Amer 11 (L) >60 mL/min   GFR calc Af Amer 13 (L) >60 mL/min   Anion gap 14 5 - 15  CBC  Result Value Ref Range   WBC 12.4 (H) 4.0 - 10.5 K/uL   RBC 2.94 (L) 4.22 - 5.81 MIL/uL   Hemoglobin 10.2 (L) 13.0 - 17.0 g/dL   HCT 31.1 (L) 39.0 - 52.0 %   MCV 105.8 (H) 78.0 - 100.0  fL   MCH 34.7 (H) 26.0 - 34.0 pg   MCHC 32.8 30.0 - 36.0 g/dL   RDW 17.3 (H) 11.5 - 15.5 %   Platelets 147 (L) 150 - 400 K/uL  Troponin I  Result Value Ref Range   Troponin I <0.03 <0.031 ng/mL   No results found.  MDM  Patient clinically dehydrated. Renal function worse and hypernatremia worse than yesterday. Continues to vomit despite treatment with antiemetics . Spoke with Dr.Memon plan admit telemetry intravenous hydration Final diagnoses:  None  Dx #1 dehydration #2 renal failure #3 hypernateremia #4Hyperglycemia #5anemia      Orlie Dakin, MD 07/08/15 1339

## 2015-07-08 NOTE — ED Notes (Signed)
Pt noted to be bleeding from needle sticks from yesterday.  Pressure dressing applied and bleeding controlled.

## 2015-07-08 NOTE — ED Notes (Signed)
Pt sent from [facility] for evaluation of nausea and vomiting.  Was seen yesterday for same and given rx for Zofran which has not yet been filled and administered at facility.  Pt denies abd pain.

## 2015-07-08 NOTE — H&P (Signed)
[facility] History and Physical  Zachary [name] [id number] DOB: [birth year] DOA: 07/08/2015  Referring physician: Dr. Winfred Leeds, ER PCP: [name], MD   Chief Complaint: vomiting  HPI: Zachary [name] is a 69 y.o. male with a history of mental retardation, chronic kidney disease stage IV and urinary retention who is a resident at a group home. Patient recently had a urethral stricture dilated as well as had an AV fistula placed. It is reported that since his procedures he's had persistent nausea and vomiting. He is unable to tolerate anything by mouth. History is limited due to mental status. No family is present in the room. There is no reported history of fever or diarrhea. Patient had become increasingly dehydrated and was sent to the emergency room. In the ER he was noted to have rising creatinine, hypernatremia and evidence of dehydration. He's been referred for admission.   Review of Systems:  Pertinent positives as per HPI, otherwise negative  Past Medical History  Diagnosis Date  . Hypertension   . Diabetes mellitus   . Smoking   . Abnormal CT scan, kidney 10/06/2011  . UTI (lower urinary tract infection) 10/06/2011  . Bladder wall thickening 10/06/2011  . 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 Poor historian poor historian  . BPH (benign prostatic hypertrophy)   . Anxiety     mental retardation  . Hypothyroidism   . MR (mental retardation)   . Acute pyelonephritis 10/07/2011  . Hyperkalemia   . Renal insufficiency     chronic history  . Renal failure (ARF), acute on chronic   . Anemia     normocytic  . Edema      history of lower extremity edema  . Hyperlipidemia   . Impaired speech    Past Surgical History  Procedure Laterality Date  . Cystoscopy with urethral dilatation N/A 12/29/2013    Procedure: CYSTOSCOPY WITH URETHRAL DILATATION;  Surgeon: Marissa Nestle, MD;  Location: [facility];  Service: Urology;  Laterality: N/A;  . Transurethral resection of  prostate N/A 01/04/2014    Procedure: TRANSURETHRAL RESECTION OF THE PROSTATE (TURP) (procedure #2);  Surgeon: Marissa Nestle, MD;  Location: [facility];  Service: Urology;  Laterality: N/A;  . Circumcision N/A 01/04/2014    Procedure: CIRCUMCISION ADULT (procedure #1);  Surgeon: Marissa Nestle, MD;  Location: [facility];  Service: Urology;  Laterality: N/A;  . Cystoscopy w/ retrogrades Bilateral 06/29/2015    Procedure: CYSTOSCOPY, DILATION OF URETHRAL STRICTURE WITH BILATERAL RETROGRADE PYELOGRAM,SUPRAPUBIC TUBE CHANGE;  Surgeon: Festus Aloe, MD;  Location: [facility];  Service: Urology;  Laterality: Bilateral;   Social History:  reports that he has never smoked. He has never used smokeless tobacco. He reports that he does not drink alcohol or use illicit drugs.  No Known Allergies  Family History  Problem Relation Age of Onset  . Cancer Mother      Prior to Admission medications   Medication Sig Start Date End Date Taking? Authorizing Provider  carvedilol (COREG) 6.25 MG tablet Take 1 tablet (6.25 mg total) by mouth 2 (two) times daily with a meal. 03/08/15  Yes [name], MD  ferrous sulfate 325 (65 FE) MG tablet Take 325 mg by mouth daily with breakfast.   Yes Historical Provider, MD  HYDROcodone-acetaminophen (NORCO/VICODIN) 5-325 MG per tablet Take 1 tablet by mouth every 6 (six) hours as needed for moderate pain. 07/06/15  Yes [name], PA-C  isosorbide-hydrALAZINE (BIDIL) 20-37.5 MG per tablet Take 1 tablet by mouth 3 (  three) times daily. Patient taking differently: Take 1 tablet by mouth 3 (three) times daily. 8am,2pm,8pm 03/08/15  Yes [name], MD  levothyroxine (SYNTHROID, LEVOTHROID) 75 MCG tablet Take 75 mcg by mouth daily before breakfast.    Yes Historical Provider, MD  linagliptin (TRADJENTA) 5 MG TABS tablet Take 5 mg by mouth daily.   Yes Historical Provider, MD  niacin (NIASPAN) 1000 MG CR tablet Take 1,000 mg by mouth at bedtime.   Yes Historical Provider, MD    simvastatin (ZOCOR) 20 MG tablet Take 20 mg by mouth at bedtime.  02/28/15  Yes Historical Provider, MD  sodium polystyrene (KAYEXALATE) 15 GM/60ML suspension Take 30 g by mouth once a week. Drink the entire contents of one bottle (14ml/30g) once weekly on Mondays   Yes Historical Provider, MD  torsemide (DEMADEX) 20 MG tablet Take 20 mg by mouth. Daily at 8am 06/13/15  Yes Historical Provider, MD  Vitamin D, Ergocalciferol, (DRISDOL) 50000 UNITS CAPS capsule Take 1 capsule (50,000 Units total) by mouth every 7 (seven) days. Patient taking differently: Take 50,000 Units by mouth every 7 (seven) days. On Monday 03/08/15  Yes [name], MD   Physical Exam: Filed Vitals:   07/08/15 1230 07/08/15 1300 07/08/15 1330 07/08/15 1539  BP: 148/89 169/99 169/101 171/98  Pulse: 118 117 113 110  Temp:    97.6 F (36.4 C)  TempSrc:    Oral  Resp: 17 11 12 12   Height:    5\' 7"  (1.702 m)  Weight:    76.885 kg (169 lb 8 oz)  SpO2: 97% 98% 97% 99%    Wt Readings from Last 3 Encounters:  07/08/15 76.885 kg (169 lb 8 oz)  07/07/15 83.462 kg (184 lb)  07/06/15 83.462 kg (184 lb)    General:  Laying in bed, no distress Eyes: PERRL, normal lids, irises & conjunctiva ENT: mucous membranes are dry Neck: no LAD, masses or thyromegaly Cardiovascular: tachycardic, regular, no m/r/g. No LE edema. Telemetry: SR, no arrhythmias  Respiratory: CTA bilaterally, no w/r/r. Normal respiratory effort. Abdomen: soft, ntnd, bs+ Skin: no rash or induration seen on limited exam Musculoskeletal: grossly normal tone BUE/BLE Psychiatric: flat affect, cooperative Neurologic: grossly non-focal.          Labs on Admission:  Basic Metabolic Panel:  Recent Labs Lab 07/06/15 0949 07/07/15 1120 07/08/15 1205  NA 149* 150* 153*  K 4.5 4.4 4.0  CL  --  114* 112*  CO2  --  24 27  GLUCOSE 165* 166* 204*  BUN  --  60* 62*  CREATININE  --  4.37* 4.87*  CALCIUM  --  9.2 9.4   Liver Function Tests:  Recent  Labs Lab 07/08/15 1205  AST 21  ALT 13*  ALKPHOS 41  BILITOT 0.9  PROT 7.8  ALBUMIN 4.1    Recent Labs Lab 07/08/15 1205  LIPASE 61*   No results for input(s): AMMONIA in the last 168 hours. CBC:  Recent Labs Lab 07/06/15 0949 07/07/15 1051 07/08/15 1205  WBC  --  11.4* 12.4*  NEUTROABS  --  8.8*  --   HGB 10.2* 9.6* 10.2*  HCT 30.0* 29.2* 31.1*  MCV  --  105.4* 105.8*  PLT  --  139* 147*   Cardiac Enzymes:  Recent Labs Lab 07/08/15 1205  TROPONINI <0.03    BNP (last 3 results) No results for input(s): BNP in the last 8760 hours.  ProBNP (last 3 results) No results for input(s): PROBNP in the last  8760 hours.  CBG:  Recent Labs Lab 07/06/15 1210 07/06/15 1338 07/06/15 1630  GLUCAP 145* 149* 164*    Radiological Exams on Admission: No results found.  EKG: Independently reviewed. Sinus tachycardia. No acute changes  Assessment/Plan Active Problems:   DM (diabetes mellitus), type 2, uncontrolled   Mental retardation   Dehydration   Acute renal failure superimposed on stage 4 chronic kidney disease   Nausea and vomiting   Essential hypertension   Hypernatremia   Hyperlipidemia   Nausea & vomiting   1. Nausea and vomiting. Etiology is unclear. Possibly related to gastroenteritis versus gastroparesis. Abdominal exam appears benign. Will check CT abdomen to rule out underlying pathology. Continue on antiemetics. Start on clear liquids and advanced diet as tolerated. Will give a trial of Reglan. 2. Dehydration. Related to persistent vomiting. Continue with IV fluids. 3. Acute on chronic kidney disease stage IV. Related to persistent vomiting and dehydration. Will start on IV fluids. If renal function fails to improve, consider further workup. 4. Hypernatremia. Related to dehydration. Continue hypotonic fluids. 5. Hyperlipidemia. Continue statin. 6. Diabetes. Will continue on sliding scale insulin. 7. Hypertension. Currently hypertensive. Continue  outpatient regimen  Code Status: full code DVT Prophylaxis: lovenox Family Communication: no family present Disposition Plan: discharge when improved  Time spent: 90mins  Raidon Swanner [facility] Pager [phone]

## 2015-07-08 NOTE — Progress Notes (Signed)
Patient encouraged to try to drink contrast as ordered for ABD/pelvic CT scan but doesn't like it and refuses. MD & CT staff made aware.

## 2015-07-08 NOTE — Progress Notes (Signed)
1415 Patient arrived to 300 unit room# 329 via stretcher from ED. IV catheter noted to RIGHT AC bleeding and partially intact, area cleansed and drsg reinforced. Urinary catheter noted with leg bag in place to RIGHT leg full of golden colored urine, catheter bag emptied 500cc noted, drainage bag changed to standard urinary catheter drainage bag & stat-loc applied to RIGHT leg to anchor catheter. Telemetry applied to patient as ordered box# 14, central telemetry notified & confirmed tele reading. Patient noted vomiting x 2, clear liquid. Patient oriented to room and staff, currently resting quietly in bed at this time.

## 2015-07-09 ENCOUNTER — Encounter (HOSPITAL_COMMUNITY): Payer: Self-pay | Admitting: Vascular Surgery

## 2015-07-09 ENCOUNTER — Other Ambulatory Visit (HOSPITAL_COMMUNITY): Payer: Medicare Other

## 2015-07-09 ENCOUNTER — Telehealth: Payer: Self-pay | Admitting: Vascular Surgery

## 2015-07-09 DIAGNOSIS — N179 Acute kidney failure, unspecified: Secondary | ICD-10-CM | POA: Diagnosis not present

## 2015-07-09 LAB — BASIC METABOLIC PANEL
Anion gap: 6 (ref 5–15)
BUN: 52 mg/dL — ABNORMAL HIGH (ref 6–20)
CO2: 27 mmol/L (ref 22–32)
Calcium: 8.5 mg/dL — ABNORMAL LOW (ref 8.9–10.3)
Chloride: 115 mmol/L — ABNORMAL HIGH (ref 101–111)
Creatinine, Ser: 3.61 mg/dL — ABNORMAL HIGH (ref 0.61–1.24)
GFR calc Af Amer: 19 mL/min — ABNORMAL LOW (ref >60)
GFR calc non Af Amer: 16 mL/min — ABNORMAL LOW (ref >60)
Glucose, Bld: 205 mg/dL — ABNORMAL HIGH (ref 65–99)
Potassium: 3.6 mmol/L (ref 3.5–5.1)
Sodium: 148 mmol/L — ABNORMAL HIGH (ref 135–145)

## 2015-07-09 LAB — CBC
HCT: 25.5 % — ABNORMAL LOW (ref 39.0–52.0)
Hemoglobin: 8.6 g/dL — ABNORMAL LOW (ref 13.0–17.0)
MCH: 35.2 pg — ABNORMAL HIGH (ref 26.0–34.0)
MCHC: 33.7 g/dL (ref 30.0–36.0)
MCV: 104.5 fL — ABNORMAL HIGH (ref 78.0–100.0)
Platelets: 114 10*3/uL — ABNORMAL LOW (ref 150–400)
RBC: 2.44 MIL/uL — ABNORMAL LOW (ref 4.22–5.81)
RDW: 16.8 % — ABNORMAL HIGH (ref 11.5–15.5)
WBC: 10 10*3/uL (ref 4.0–10.5)

## 2015-07-09 LAB — GLUCOSE, CAPILLARY
Glucose-Capillary: 194 mg/dL — ABNORMAL HIGH (ref 65–99)
Glucose-Capillary: 195 mg/dL — ABNORMAL HIGH (ref 65–99)
Glucose-Capillary: 267 mg/dL — ABNORMAL HIGH (ref 65–99)
Glucose-Capillary: 123 mg/dL — ABNORMAL HIGH (ref 65–99)

## 2015-07-09 NOTE — Clinical Social Work Note (Signed)
Clinical Social Work Assessment  Patient Details  Name: Zachary [name] MRN: [id number] Date of Birth: 07-05-1946  Date of referral:  07/09/15               Reason for consult:  Facility Placement                Permission sought to share information with:    Permission granted to share information::     Name::        Agency::     Relationship::     Contact Information:     Housing/Transportation Living arrangements for the past 2 months:  [place] of Information:  [place] Patient Interpreter Needed:  None Criminal Activity/Legal Involvement Pertinent to Current Situation/Hospitalization:  No - Comment as needed Significant Relationships:  Siblings Lives with:  Facility Resident Do you feel safe going back to the place where you live?  Yes Need for family participation in patient care:  Yes (Comment)  Care giving concerns:  Pt is long term resident at [place].    Social Worker assessment / plan:  CSW attempted to meet with pt, but pt sleeping at time of visit and is oriented to self only per chart. Pt well known to CSW from previous admissions. DSS became guardian several months ago and requested placement at [facility] while pt was in hospital. This was arranged and he has remained there. Pt has several siblings that are involved and okay per [name] at [facility] for them to have information. Per [name] at facility, pt has suprapubic catheter and has [facility] RN. He had fistula placed last week. Since that time, pt has been unable to keep anything down. He was sent to ED Saturday and [name] reports that he was then d/c. Sunday pt returned with same symptoms. Okay to return. [name] also requests return if appropriate for ALF. [name] said that pt requires assist with bathing, dressing, and toileting. He ambulates independently.    Employment status:  Disabled (Comment on whether or not currently receiving Disability) Insurance information:   Medicare PT Recommendations:  Not assessed at this time Information / Referral to community resources:  Other (Comment Required) (return to [facility])  Patient/Family's Response to care:  Guardian requests return to [facility] if appropriate at d/c.   Patient/Family's Understanding of and Emotional Response to Diagnosis, Current Treatment, and Prognosis:  Guardian aware of pt's chronic health problems and admission diagnosis. Will continue to keep updated.   Emotional Assessment Appearance:  Appears stated age Attitude/Demeanor/Rapport:  Unable to Assess Affect (typically observed):  Unable to Assess Orientation:  Oriented to Self Alcohol / Substance use:  Not Applicable Psych involvement (Current and /or in the community):  No (Comment)  Discharge Needs  Concerns to be addressed:  Discharge Planning Concerns Readmission within the last 30 days:  No Current discharge risk:  Chronically ill Barriers to Discharge:  Continued Medical Work up   [employer], [employer], [place] 07/09/2015, 2:47 PM [phone]

## 2015-07-09 NOTE — Telephone Encounter (Signed)
-----   Message from Mena Goes, RN sent at 07/06/2015  4:38 PM EDT ----- Regarding: Schedule   ----- Message -----    From: Alvia Grove, PA-C    Sent: 07/06/2015   4:13 PM      To: [facility] Charge Pool  S/p left upper arm graft insertion 07/06/15  F/u in 2-3 weeks with Dr. Scot Dock. No studies.  Thanks Maudie Mercury

## 2015-07-09 NOTE — Progress Notes (Signed)
Inpatient Diabetes Program Recommendations  AACE/ADA: New Consensus Statement on Inpatient Glycemic Control (2013)  Target Ranges:  Prepandial:   less than 140 mg/dL      Peak postprandial:   less than 180 mg/dL (1-2 hours)      Critically ill patients:  140 - 180 mg/dL   Results for LOTHAR, PRUYN (MRN [id number]) as of 07/09/2015 11:03  Ref. Range 07/06/2015 12:10 07/06/2015 13:38 07/06/2015 16:30 07/08/2015 16:26 07/08/2015 21:38 07/09/2015 07:37  Glucose-Capillary Latest Ref Range: 65-99 mg/dL 145 (H) 149 (H) 164 (H) 150 (H) 260 (H) 194 (H)    Diabetes history: DM2 Outpatient Diabetes medications: Tradjenta 5 mg daily Current orders for Inpatient glycemic control: Novolog 0-15 units TID with meals, Novolog 0-5 units HS  Inpatient Diabetes Program Recommendations Diet: If appropriate, may want to consider changing Ensure to Glucerna which will provide less carbohydrates.  Thanks, Barnie Alderman, RN, MSN, CCRN, CDE Diabetes Coordinator Inpatient Diabetes Program [phone] (Team Pager from [place] to [place]) [phone] ([facility] office) [phone] [facility] office) [phone] [facility] office)

## 2015-07-09 NOTE — Progress Notes (Addendum)
Initial Nutrition Assessment  DOCUMENTATION CODES:  Severe malnutrition in context of chronic illness   Pt meets criteria for Severe MALNUTRITION in the context of Chronic Illness as evidenced by Loss of >15% bw in 1 month and an estimated intake that met <75% of needs for > 1 month.   INTERVENTION:  Boost Breeze po TID, each supplement provides 250 kcal and 9 grams of protein  Switch to Glucerna once diet advanced to better control BG  Recommend Rena-vite or equivalent mvi  NUTRITION DIAGNOSIS:  Inadequate oral intake related to inability to eat, nausea, vomiting as evidenced by loss of 15% bw in the last month  GOAL:  Patient will meet greater than or equal to 90% of their needs  MONITOR:  PO intake, Supplement acceptance, Diet advancement, Labs, Weight trends, work up  REASON FOR ASSESSMENT:  Malnutrition Screening Tool    ASSESSMENT:  69 y.o. Male PMHx mental retardation, CKD IV , urinary retention, Uncontrolled DM2,  htn, hld, hypothyroidism who is a resident at a group home. Presents with persistent n/v after recent AV fistula placement  9/9 and urethral stent placement 9/2. Unable to tolerate PO intake. Found to be dehydrated  His weight loss appeared to start before he had his procedures done. Suspect longer history of poor intake than just the past week or two.  On RD arrival, no reliable historians available to receive any hx from. Untouched CL diet tray in front of patient.   He has been eating 25-50% of his meals. He was unable to tell me any foods he wanted or how he has been eating recently.   Diet Order:  Diet clear liquid Room service appropriate?: Yes; Fluid consistency:: Thin  Skin:  Reviewed, no issues  Last BM:  9/10  Height:  Ht Readings from Last 1 Encounters:  07/08/15 _0  (1.702 m)   Weight:  Wt Readings from Last 1 Encounters:  07/08/15 169 lb 8 oz (76.885 kg)   Wt Readings from Last 10 Encounters:  07/08/15 169 lb 8 oz (76.885 kg)   07/07/15 184 lb (83.462 kg)  07/06/15 184 lb (83.462 kg)  06/29/15 184 lb (83.462 kg)  06/14/15 203 lb (92.08 kg)  05/24/15 203 lb 14.8 oz (92.5 kg)  04/23/15 200 lb 3.2 oz (90.81 kg)  04/10/15 217 lb (98.431 kg)  03/28/15 217 lb (98.431 kg)  03/02/15 197 lb (89.359 kg)   Ideal Body Weight:  67.27 kg  BMI:  Body mass index is 26.54 kg/(m^2).  Estimated Nutritional Needs:  Kcal:  1700-1900 kcals (22-25 kcal/kg) Protein:  80-94 (1.2-1.4 g/kg ibw) Fluid:  PER MD  EDUCATION NEEDS:  No education needs identified at this time  Burtis Junes RD, LDN Nutrition Pager: [phone] 07/09/2015 11:50 AM

## 2015-07-09 NOTE — Consult Note (Signed)
Reason for Consult: Acute kidney injury superimposed on chronic Referring Physician: [name] is an 69 y.o. male.  HPI: He is a patient who has history of diabetes, chronic renal failure stage IV, history of hypertension presently was brought because of nausea, vomiting and poor appetite for the last 2 days. Patient was seen by vascular surgery about 3 days ago and he had left upper arm graft was put in. Since then patient was not able to eat. He denies any abdominal pain. Presently patient was found to have worsening of his renal failure and also hypernatremia hence admitted to the hospital.  Past Medical History  Diagnosis Date  . Hypertension   . Diabetes mellitus   . Smoking   . Abnormal CT scan, kidney 10/06/2011  . UTI (lower urinary tract infection) 10/06/2011  . Bladder wall thickening 10/06/2011  . 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 Poor historian poor historian  . BPH (benign prostatic hypertrophy)   . Anxiety     mental retardation  . Hypothyroidism   . MR (mental retardation)   . Acute pyelonephritis 10/07/2011  . Hyperkalemia   . Renal insufficiency     chronic history  . Renal failure (ARF), acute on chronic   . Anemia     normocytic  . Edema      history of lower extremity edema  . Hyperlipidemia   . Impaired speech     Past Surgical History  Procedure Laterality Date  . Cystoscopy with urethral dilatation N/A 12/29/2013    Procedure: CYSTOSCOPY WITH URETHRAL DILATATION;  Surgeon: Marissa Nestle, MD;  Location: [facility];  Service: Urology;  Laterality: N/A;  . Transurethral resection of prostate N/A 01/04/2014    Procedure: TRANSURETHRAL RESECTION OF THE PROSTATE (TURP) (procedure #2);  Surgeon: Marissa Nestle, MD;  Location: [facility];  Service: Urology;  Laterality: N/A;  . Circumcision N/A 01/04/2014    Procedure: CIRCUMCISION ADULT (procedure #1);  Surgeon: Marissa Nestle, MD;  Location: [facility];  Service: Urology;  Laterality: N/A;  .  Cystoscopy w/ retrogrades Bilateral 06/29/2015    Procedure: CYSTOSCOPY, DILATION OF URETHRAL STRICTURE WITH BILATERAL RETROGRADE PYELOGRAM,SUPRAPUBIC TUBE CHANGE;  Surgeon: Festus Aloe, MD;  Location: [facility];  Service: Urology;  Laterality: Bilateral;    Family History  Problem Relation Age of Onset  . Cancer Mother     Social History:  reports that he has never smoked. He has never used smokeless tobacco. He reports that he does not drink alcohol or use illicit drugs.  Allergies: No Known Allergies  Medications: I have reviewed the patient's current medications.  Results for orders placed or performed during the hospital encounter of 07/08/15 (from the past 48 hour(s))  Lipase, blood     Status: Abnormal   Collection Time: 07/08/15 12:05 PM  Result Value Ref Range   Lipase 61 (H) 22 - 51 U/L  Comprehensive metabolic panel     Status: Abnormal   Collection Time: 07/08/15 12:05 PM  Result Value Ref Range   Sodium 153 (H) 135 - 145 mmol/L   Potassium 4.0 3.5 - 5.1 mmol/L   Chloride 112 (H) 101 - 111 mmol/L   CO2 27 22 - 32 mmol/L   Glucose, Bld 204 (H) 65 - 99 mg/dL   BUN 62 (H) 6 - 20 mg/dL   Creatinine, Ser 4.87 (H) 0.61 - 1.24 mg/dL   Calcium 9.4 8.9 - 10.3 mg/dL   Total Protein 7.8 6.5 - 8.1 g/dL  Albumin 4.1 3.5 - 5.0 g/dL   AST 21 15 - 41 U/L   ALT 13 (L) 17 - 63 U/L   Alkaline Phosphatase 41 38 - 126 U/L   Total Bilirubin 0.9 0.3 - 1.2 mg/dL   GFR calc non Af Amer 11 (L) >60 mL/min   GFR calc Af Amer 13 (L) >60 mL/min    Comment: (NOTE) The eGFR has been calculated using the CKD EPI equation. This calculation has not been validated in all clinical situations. eGFR's persistently <60 mL/min signify possible Chronic Kidney Disease.    Anion gap 14 5 - 15  CBC     Status: Abnormal   Collection Time: 07/08/15 12:05 PM  Result Value Ref Range   WBC 12.4 (H) 4.0 - 10.5 K/uL   RBC 2.94 (L) 4.22 - 5.81 MIL/uL   Hemoglobin 10.2 (L) 13.0 - 17.0 g/dL   HCT 31.1 (L)  39.0 - 52.0 %   MCV 105.8 (H) 78.0 - 100.0 fL   MCH 34.7 (H) 26.0 - 34.0 pg   MCHC 32.8 30.0 - 36.0 g/dL   RDW 17.3 (H) 11.5 - 15.5 %   Platelets 147 (L) 150 - 400 K/uL  Troponin I     Status: None   Collection Time: 07/08/15 12:05 PM  Result Value Ref Range   Troponin I <0.03 <0.031 ng/mL    Comment:        NO INDICATION OF MYOCARDIAL INJURY.   Glucose, capillary     Status: Abnormal   Collection Time: 07/08/15  4:26 PM  Result Value Ref Range   Glucose-Capillary 150 (H) 65 - 99 mg/dL   Comment 1 Notify RN    Comment 2 Document in Chart   Glucose, capillary     Status: Abnormal   Collection Time: 07/08/15  9:38 PM  Result Value Ref Range   Glucose-Capillary 260 (H) 65 - 99 mg/dL   Comment 1 Notify RN    Comment 2 Document in Chart   Basic metabolic panel     Status: Abnormal   Collection Time: 07/09/15  6:47 AM  Result Value Ref Range   Sodium 148 (H) 135 - 145 mmol/L   Potassium 3.6 3.5 - 5.1 mmol/L   Chloride 115 (H) 101 - 111 mmol/L   CO2 27 22 - 32 mmol/L   Glucose, Bld 205 (H) 65 - 99 mg/dL   BUN 52 (H) 6 - 20 mg/dL   Creatinine, Ser 3.61 (H) 0.61 - 1.24 mg/dL   Calcium 8.5 (L) 8.9 - 10.3 mg/dL   GFR calc non Af Amer 16 (L) >60 mL/min   GFR calc Af Amer 19 (L) >60 mL/min    Comment: (NOTE) The eGFR has been calculated using the CKD EPI equation. This calculation has not been validated in all clinical situations. eGFR's persistently <60 mL/min signify possible Chronic Kidney Disease.    Anion gap 6 5 - 15  CBC     Status: Abnormal   Collection Time: 07/09/15  6:47 AM  Result Value Ref Range   WBC 10.0 4.0 - 10.5 K/uL   RBC 2.44 (L) 4.22 - 5.81 MIL/uL   Hemoglobin 8.6 (L) 13.0 - 17.0 g/dL   HCT 25.5 (L) 39.0 - 52.0 %   MCV 104.5 (H) 78.0 - 100.0 fL   MCH 35.2 (H) 26.0 - 34.0 pg   MCHC 33.7 30.0 - 36.0 g/dL   RDW 16.8 (H) 11.5 - 15.5 %   Platelets 114 (L) 150 -  400 K/uL    Comment: SPECIMEN CHECKED FOR CLOTS CONSISTENT WITH PREVIOUS RESULT   Glucose,  capillary     Status: Abnormal   Collection Time: 07/09/15  7:37 AM  Result Value Ref Range   Glucose-Capillary 194 (H) 65 - 99 mg/dL   Comment 1 Notify RN    Comment 2 Document in Chart     Ct Abdomen Pelvis Wo Contrast  07/08/2015   CLINICAL DATA:  Nausea and vomiting. Symptoms began with recent in urethral stricture dilation. Persistent nausea and vomiting.  EXAM: CT ABDOMEN AND PELVIS WITHOUT CONTRAST  TECHNIQUE: Multidetector CT imaging of the abdomen and pelvis was performed following the standard protocol without IV contrast.  COMPARISON:  10/05/2011.  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 No aggressive osseous lesions. L4-L5 and L5-S1 predominant degenerative disease of the lumbar spine.  Lung Bases: Atelectasis.  Liver: Unenhanced CT was performed per clinician order. Lack of IV contrast limits sensitivity and specificity, especially for evaluation of abdominal/pelvic solid viscera. Grossly normal.  Spleen:  Normal.  Gallbladder:  Normal.  Common bile duct:  Normal.  Pancreas:  Normal.  Adrenal glands:  Normal bilaterally.  Kidneys: Patulous renal collecting systems however there is no hydronephrosis. No calculi. Distal ureters are collapsed.  Stomach: Patulous distal thoracic esophagus. This is a nonspecific finding. Consider followup endoscopy if there are esophageal symptoms.  Small bowel:  Grossly normal.  Colon: Normal appendix. No inflammatory changes or obstruction of colon.  Pelvic Genitourinary: Suprapubic catheter is present. Additionally, there is a Foley catheter with the balloon inflated at the bladder dome. There appears to be thickening of the bladder wall for the degree of distension, which is probably chronic.  Peritoneum: Normal.  No free air or free fluid.  Vascular/lymphatic: Atherosclerosis. Low attenuation of the intravascular compartment suggesting anemia. Coronary artery atherosclerosis is present. If office based assessment  of coronary risk factors has not been performed, it is now recommended.  Body Wall: Tiny fat containing periumbilical hernia.  IMPRESSION: 1. No acute abdominal abnormality. 2. Suprapubic and Foley catheter with likely chronic bladder wall thickening. 3. Patulous distal thoracic esophagus.  See discussion above.   Electronically Signed   By: Dereck Ligas M.D.   On: 07/08/2015 19:32    Review of Systems  Constitutional: Negative for fever and chills.  Respiratory: Negative for shortness of breath.   Cardiovascular: Negative for orthopnea and leg swelling.  Gastrointestinal: Positive for nausea and vomiting. Negative for abdominal pain and diarrhea.   Blood pressure 167/105, pulse 123, temperature 98 F (36.7 C), temperature source Oral, resp. rate 16, height '5\' 7"'  (1.702 m), weight 169 lb 8 oz (76.885 kg), SpO2 98 %. Physical Exam  Constitutional: No distress.  Eyes: No scleral icterus.  Neck: No JVD present.  Cardiovascular: Normal rate and regular rhythm.   No murmur heard. Respiratory: He has no wheezes. He has no rales.  GI: He exhibits no distension. There is no tenderness.  Musculoskeletal: He exhibits no edema.  Neurological: He is alert.  Skin:  Patient with left upper arm graft, slight erythema but no drainage. Wound site is clean.    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 Presently his BUN and creatinine seems to be improving. Patient is non-oliguric. Problem #2 chronic renal failure: Stage IV. This is a compression of obstructive uropathy/diabetes/hypertension. Problem #3 anemia: Recommendation of iron deficiency and anemia of chronic disease. His hemoglobin is lower than our target goal.  Problem #4 nausea and vomiting. Etiology presently is no clear. Problem #5 hyperphosphatemia: This is lack of free water. His sodium is better today. Problem #6 history of diabetes Problem #7 hypertension: His blood pressure is  slightly high but stable. Problem #8 mental retardation. Problem #9 hyperkalemia: His potassium is normal. Plan: Agree with hydration We'll increase his IV fluid to 125 mL/h We'll check his CBC, basic metabolic panel and phosphorus in the morning.   Garrison Michie S 07/09/2015, 8:26 AM

## 2015-07-09 NOTE — Telephone Encounter (Signed)
Spoke with pts brother, and lm for [facility], dpm

## 2015-07-09 NOTE — Progress Notes (Signed)
Subjective: Patient was admitted due to nausea and vomiting. His renal function got worse. Patient is started on Iv hydration.  Objective: Vital signs in last 24 hours: Temp:  [97.5 F (36.4 C)-98.6 F (37 C)] 98 F (36.7 C) (09/12 0630) Pulse Rate:  [110-123] 123 (09/12 0630) Resp:  [11-20] 16 (09/11 2135) BP: (142-171)/(84-105) 167/105 mmHg (09/12 0630) SpO2:  [97 %-100 %] 98 % (09/12 0630) Weight:  [76.885 kg (169 lb 8 oz)-83.462 kg (184 lb)] 76.885 kg (169 lb 8 oz) (09/11 1539) Weight change:  Last BM Date: 07/07/15  Intake/Output from previous day: 09/11 0701 - 09/12 0700 In: 1471.7 [P.O.:120; I.V.:1351.7] Out: 600 [Urine:600]  PHYSICAL EXAM General appearance: alert, no distress and slowed mentation Resp: clear to auscultation bilaterally Cardio: S1, S2 normal GI: soft, non-tender; bowel sounds normal; no masses,  no organomegaly Extremities: extremities normal, atraumatic, no cyanosis or edema  Lab Results:  Results for orders placed or performed during the hospital encounter of 07/08/15 (from the past 48 hour(s))  Lipase, blood     Status: Abnormal   Collection Time: 07/08/15 12:05 PM  Result Value Ref Range   Lipase 61 (H) 22 - 51 U/L  Comprehensive metabolic panel     Status: Abnormal   Collection Time: 07/08/15 12:05 PM  Result Value Ref Range   Sodium 153 (H) 135 - 145 mmol/L   Potassium 4.0 3.5 - 5.1 mmol/L   Chloride 112 (H) 101 - 111 mmol/L   CO2 27 22 - 32 mmol/L   Glucose, Bld 204 (H) 65 - 99 mg/dL   BUN 62 (H) 6 - 20 mg/dL   Creatinine, Ser 4.87 (H) 0.61 - 1.24 mg/dL   Calcium 9.4 8.9 - 10.3 mg/dL   Total Protein 7.8 6.5 - 8.1 g/dL   Albumin 4.1 3.5 - 5.0 g/dL   AST 21 15 - 41 U/L   ALT 13 (L) 17 - 63 U/L   Alkaline Phosphatase 41 38 - 126 U/L   Total Bilirubin 0.9 0.3 - 1.2 mg/dL   GFR calc non Af Amer 11 (L) >60 mL/min   GFR calc Af Amer 13 (L) >60 mL/min    Comment: (NOTE) The eGFR has been calculated using the CKD EPI equation. This  calculation has not been validated in all clinical situations. eGFR's persistently <60 mL/min signify possible Chronic Kidney Disease.    Anion gap 14 5 - 15  CBC     Status: Abnormal   Collection Time: 07/08/15 12:05 PM  Result Value Ref Range   WBC 12.4 (H) 4.0 - 10.5 K/uL   RBC 2.94 (L) 4.22 - 5.81 MIL/uL   Hemoglobin 10.2 (L) 13.0 - 17.0 g/dL   HCT 31.1 (L) 39.0 - 52.0 %   MCV 105.8 (H) 78.0 - 100.0 fL   MCH 34.7 (H) 26.0 - 34.0 pg   MCHC 32.8 30.0 - 36.0 g/dL   RDW 17.3 (H) 11.5 - 15.5 %   Platelets 147 (L) 150 - 400 K/uL  Troponin I     Status: None   Collection Time: 07/08/15 12:05 PM  Result Value Ref Range   Troponin I <0.03 <0.031 ng/mL    Comment:        NO INDICATION OF MYOCARDIAL INJURY.   Glucose, capillary     Status: Abnormal   Collection Time: 07/08/15  4:26 PM  Result Value Ref Range   Glucose-Capillary 150 (H) 65 - 99 mg/dL   Comment 1 Notify RN    Comment  2 Document in Chart   Glucose, capillary     Status: Abnormal   Collection Time: 07/08/15  9:38 PM  Result Value Ref Range   Glucose-Capillary 260 (H) 65 - 99 mg/dL   Comment 1 Notify RN    Comment 2 Document in Chart   Basic metabolic panel     Status: Abnormal   Collection Time: 07/09/15  6:47 AM  Result Value Ref Range   Sodium 148 (H) 135 - 145 mmol/L   Potassium 3.6 3.5 - 5.1 mmol/L   Chloride 115 (H) 101 - 111 mmol/L   CO2 27 22 - 32 mmol/L   Glucose, Bld 205 (H) 65 - 99 mg/dL   BUN 52 (H) 6 - 20 mg/dL   Creatinine, Ser 3.61 (H) 0.61 - 1.24 mg/dL   Calcium 8.5 (L) 8.9 - 10.3 mg/dL   GFR calc non Af Amer 16 (L) >60 mL/min   GFR calc Af Amer 19 (L) >60 mL/min    Comment: (NOTE) The eGFR has been calculated using the CKD EPI equation. This calculation has not been validated in all clinical situations. eGFR's persistently <60 mL/min signify possible Chronic Kidney Disease.    Anion gap 6 5 - 15  CBC     Status: Abnormal   Collection Time: 07/09/15  6:47 AM  Result Value Ref Range    WBC 10.0 4.0 - 10.5 K/uL   RBC 2.44 (L) 4.22 - 5.81 MIL/uL   Hemoglobin 8.6 (L) 13.0 - 17.0 g/dL   HCT 25.5 (L) 39.0 - 52.0 %   MCV 104.5 (H) 78.0 - 100.0 fL   MCH 35.2 (H) 26.0 - 34.0 pg   MCHC 33.7 30.0 - 36.0 g/dL   RDW 16.8 (H) 11.5 - 15.5 %   Platelets 114 (L) 150 - 400 K/uL    Comment: SPECIMEN CHECKED FOR CLOTS CONSISTENT WITH PREVIOUS RESULT   Glucose, capillary     Status: Abnormal   Collection Time: 07/09/15  7:37 AM  Result Value Ref Range   Glucose-Capillary 194 (H) 65 - 99 mg/dL   Comment 1 Notify RN    Comment 2 Document in Chart     ABGS No results for input(s): PHART, PO2ART, TCO2, HCO3 in the last 72 hours.  Invalid input(s): PCO2 CULTURES No results found for this or any previous visit (from the past 240 hour(s)). Studies/Results: Ct Abdomen Pelvis Wo Contrast  07/08/2015   CLINICAL DATA:  Nausea and vomiting. Symptoms began with recent in urethral stricture dilation. Persistent nausea and vomiting.  EXAM: CT ABDOMEN AND PELVIS WITHOUT CONTRAST  TECHNIQUE: Multidetector CT imaging of the abdomen and pelvis was performed following the standard protocol without IV contrast.  COMPARISON:  10/05/2011.  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 No aggressive osseous lesions. L4-L5 and L5-S1 predominant degenerative disease of the lumbar spine.  Lung Bases: Atelectasis.  Liver: Unenhanced CT was performed per clinician order. Lack of IV contrast limits sensitivity and specificity, especially for evaluation of abdominal/pelvic solid viscera. Grossly normal.  Spleen:  Normal.  Gallbladder:  Normal.  Common bile duct:  Normal.  Pancreas:  Normal.  Adrenal glands:  Normal bilaterally.  Kidneys: Patulous renal collecting systems however there is no hydronephrosis. No calculi. Distal ureters are collapsed.  Stomach: Patulous distal thoracic esophagus. This is a nonspecific finding. Consider followup endoscopy if there are esophageal  symptoms.  Small bowel:  Grossly normal.  Colon: Normal appendix. No inflammatory changes or obstruction of colon.  Pelvic Genitourinary: Suprapubic catheter is present. Additionally, there is a Foley catheter with the balloon inflated at the bladder dome. There appears to be thickening of the bladder wall for the degree of distension, which is probably chronic.  Peritoneum: Normal.  No free air or free fluid.  Vascular/lymphatic: Atherosclerosis. Low attenuation of the intravascular compartment suggesting anemia. Coronary artery atherosclerosis is present. If office based assessment of coronary risk factors has not been performed, it is now recommended.  Body Wall: Tiny fat containing periumbilical hernia.  IMPRESSION: 1. No acute abdominal abnormality. 2. Suprapubic and Foley catheter with likely chronic bladder wall thickening. 3. Patulous distal thoracic esophagus.  See discussion above.   Electronically Signed   By: Dereck Ligas M.D.   On: 07/08/2015 19:32    Medications: I have reviewed the patient's current medications.  Assesment:   Active Problems:   DM (diabetes mellitus), type 2, uncontrolled   Mental retardation   Dehydration   Acute renal failure superimposed on stage 4 chronic kidney disease   Nausea and vomiting   Essential hypertension   Hypernatremia   Hyperlipidemia   Nausea & vomiting    Plan:  Medications reviewed Will continue IV hydration Will monitor CBC/BMP Nephrology consult.    LOS: 1 day   Zachary [name] 07/09/2015, 8:15 AM

## 2015-07-10 ENCOUNTER — Other Ambulatory Visit (HOSPITAL_COMMUNITY): Payer: Medicare Other

## 2015-07-10 DIAGNOSIS — E43 Unspecified severe protein-calorie malnutrition: Secondary | ICD-10-CM | POA: Insufficient documentation

## 2015-07-10 LAB — BASIC METABOLIC PANEL
Anion gap: 7 (ref 5–15)
BUN: 44 mg/dL — ABNORMAL HIGH (ref 6–20)
CO2: 25 mmol/L (ref 22–32)
Calcium: 8.1 mg/dL — ABNORMAL LOW (ref 8.9–10.3)
Chloride: 112 mmol/L — ABNORMAL HIGH (ref 101–111)
Creatinine, Ser: 3.68 mg/dL — ABNORMAL HIGH (ref 0.61–1.24)
GFR calc Af Amer: 18 mL/min — ABNORMAL LOW (ref >60)
GFR calc non Af Amer: 16 mL/min — ABNORMAL LOW (ref >60)
Glucose, Bld: 196 mg/dL — ABNORMAL HIGH (ref 65–99)
Potassium: 3.4 mmol/L — ABNORMAL LOW (ref 3.5–5.1)
Sodium: 144 mmol/L (ref 135–145)

## 2015-07-10 LAB — CBC
HCT: 23.2 % — ABNORMAL LOW (ref 39.0–52.0)
Hemoglobin: 7.7 g/dL — ABNORMAL LOW (ref 13.0–17.0)
MCH: 34.4 pg — ABNORMAL HIGH (ref 26.0–34.0)
MCHC: 33.2 g/dL (ref 30.0–36.0)
MCV: 103.6 fL — ABNORMAL HIGH (ref 78.0–100.0)
Platelets: 96 10*3/uL — ABNORMAL LOW (ref 150–400)
RBC: 2.24 MIL/uL — ABNORMAL LOW (ref 4.22–5.81)
RDW: 16.5 % — ABNORMAL HIGH (ref 11.5–15.5)
WBC: 7.3 10*3/uL (ref 4.0–10.5)

## 2015-07-10 LAB — PREPARE RBC (CROSSMATCH)

## 2015-07-10 LAB — GLUCOSE, CAPILLARY
Glucose-Capillary: 168 mg/dL — ABNORMAL HIGH (ref 65–99)
Glucose-Capillary: 187 mg/dL — ABNORMAL HIGH (ref 65–99)
Glucose-Capillary: 133 mg/dL — ABNORMAL HIGH (ref 65–99)
Glucose-Capillary: 129 mg/dL — ABNORMAL HIGH (ref 65–99)

## 2015-07-10 LAB — PHOSPHORUS: Phosphorus: 2.2 mg/dL — ABNORMAL LOW (ref 2.5–4.6)

## 2015-07-10 LAB — LIPASE, BLOOD: Lipase: 47 U/L (ref 22–51)

## 2015-07-10 MED ORDER — SODIUM CHLORIDE 0.9 % IV SOLN
Freq: Once | INTRAVENOUS | Status: AC
  Administered 2015-07-10: 13:00:00 via INTRAVENOUS

## 2015-07-10 NOTE — Progress Notes (Signed)
Subjective: Patient feels better. His nausea and vomiting. He is being hydrated. His H/H has dropped.  Objective: Vital signs in last 24 hours: Temp:  [99 F (37.2 C)-100 F (37.8 C)] 99 F (37.2 C) (09/13 0700) Pulse Rate:  [100-110] 100 (09/13 0700) Resp:  [20] 20 (09/13 0700) BP: (129-160)/(60-70) 160/70 mmHg (09/13 0700) SpO2:  [97 %-99 %] 98 % (09/13 0700) Weight change:  Last BM Date: 07/07/15  Intake/Output from previous day: 09/12 0701 - 09/13 0700 In: 1629.2 [P.O.:240; I.V.:1389.2] Out: 200 [Urine:200]  PHYSICAL EXAM General appearance: alert, no distress and slowed mentation Resp: clear to auscultation bilaterally Cardio: S1, S2 normal GI: soft, non-tender; bowel sounds normal; no masses,  no organomegaly Extremities: extremities normal, atraumatic, no cyanosis or edema  Lab Results:  Results for orders placed or performed during the hospital encounter of 07/08/15 (from the past 48 hour(s))  Lipase, blood     Status: Abnormal   Collection Time: 07/08/15 12:05 PM  Result Value Ref Range   Lipase 61 (H) 22 - 51 U/L  Comprehensive metabolic panel     Status: Abnormal   Collection Time: 07/08/15 12:05 PM  Result Value Ref Range   Sodium 153 (H) 135 - 145 mmol/L   Potassium 4.0 3.5 - 5.1 mmol/L   Chloride 112 (H) 101 - 111 mmol/L   CO2 27 22 - 32 mmol/L   Glucose, Bld 204 (H) 65 - 99 mg/dL   BUN 62 (H) 6 - 20 mg/dL   Creatinine, Ser 4.87 (H) 0.61 - 1.24 mg/dL   Calcium 9.4 8.9 - 10.3 mg/dL   Total Protein 7.8 6.5 - 8.1 g/dL   Albumin 4.1 3.5 - 5.0 g/dL   AST 21 15 - 41 U/L   ALT 13 (L) 17 - 63 U/L   Alkaline Phosphatase 41 38 - 126 U/L   Total Bilirubin 0.9 0.3 - 1.2 mg/dL   GFR calc non Af Amer 11 (L) >60 mL/min   GFR calc Af Amer 13 (L) >60 mL/min    Comment: (NOTE) The eGFR has been calculated using the CKD EPI equation. This calculation has not been validated in all clinical situations. eGFR's persistently <60 mL/min signify possible Chronic  Kidney Disease.    Anion gap 14 5 - 15  CBC     Status: Abnormal   Collection Time: 07/08/15 12:05 PM  Result Value Ref Range   WBC 12.4 (H) 4.0 - 10.5 K/uL   RBC 2.94 (L) 4.22 - 5.81 MIL/uL   Hemoglobin 10.2 (L) 13.0 - 17.0 g/dL   HCT 31.1 (L) 39.0 - 52.0 %   MCV 105.8 (H) 78.0 - 100.0 fL   MCH 34.7 (H) 26.0 - 34.0 pg   MCHC 32.8 30.0 - 36.0 g/dL   RDW 17.3 (H) 11.5 - 15.5 %   Platelets 147 (L) 150 - 400 K/uL  Troponin I     Status: None   Collection Time: 07/08/15 12:05 PM  Result Value Ref Range   Troponin I <0.03 <0.031 ng/mL    Comment:        NO INDICATION OF MYOCARDIAL INJURY.   Glucose, capillary     Status: Abnormal   Collection Time: 07/08/15  4:26 PM  Result Value Ref Range   Glucose-Capillary 150 (H) 65 - 99 mg/dL   Comment 1 Notify RN    Comment 2 Document in Chart   Glucose, capillary     Status: Abnormal   Collection Time: 07/08/15  9:38 PM  Result Value Ref Range   Glucose-Capillary 260 (H) 65 - 99 mg/dL   Comment 1 Notify RN    Comment 2 Document in Chart   Basic metabolic panel     Status: Abnormal   Collection Time: 07/09/15  6:47 AM  Result Value Ref Range   Sodium 148 (H) 135 - 145 mmol/L   Potassium 3.6 3.5 - 5.1 mmol/L   Chloride 115 (H) 101 - 111 mmol/L   CO2 27 22 - 32 mmol/L   Glucose, Bld 205 (H) 65 - 99 mg/dL   BUN 52 (H) 6 - 20 mg/dL   Creatinine, Ser 3.61 (H) 0.61 - 1.24 mg/dL   Calcium 8.5 (L) 8.9 - 10.3 mg/dL   GFR calc non Af Amer 16 (L) >60 mL/min   GFR calc Af Amer 19 (L) >60 mL/min    Comment: (NOTE) The eGFR has been calculated using the CKD EPI equation. This calculation has not been validated in all clinical situations. eGFR's persistently <60 mL/min signify possible Chronic Kidney Disease.    Anion gap 6 5 - 15  CBC     Status: Abnormal   Collection Time: 07/09/15  6:47 AM  Result Value Ref Range   WBC 10.0 4.0 - 10.5 K/uL   RBC 2.44 (L) 4.22 - 5.81 MIL/uL   Hemoglobin 8.6 (L) 13.0 - 17.0 g/dL   HCT 25.5 (L) 39.0 -  52.0 %   MCV 104.5 (H) 78.0 - 100.0 fL   MCH 35.2 (H) 26.0 - 34.0 pg   MCHC 33.7 30.0 - 36.0 g/dL   RDW 16.8 (H) 11.5 - 15.5 %   Platelets 114 (L) 150 - 400 K/uL    Comment: SPECIMEN CHECKED FOR CLOTS CONSISTENT WITH PREVIOUS RESULT   Glucose, capillary     Status: Abnormal   Collection Time: 07/09/15  7:37 AM  Result Value Ref Range   Glucose-Capillary 194 (H) 65 - 99 mg/dL   Comment 1 Notify RN    Comment 2 Document in Chart   Glucose, capillary     Status: Abnormal   Collection Time: 07/09/15 11:18 AM  Result Value Ref Range   Glucose-Capillary 195 (H) 65 - 99 mg/dL  Glucose, capillary     Status: Abnormal   Collection Time: 07/09/15  4:07 PM  Result Value Ref Range   Glucose-Capillary 267 (H) 65 - 99 mg/dL  Glucose, capillary     Status: Abnormal   Collection Time: 07/09/15  8:35 PM  Result Value Ref Range   Glucose-Capillary 123 (H) 65 - 99 mg/dL  Lipase, blood     Status: None   Collection Time: 07/10/15  6:33 AM  Result Value Ref Range   Lipase 47 22 - 51 U/L  CBC     Status: Abnormal   Collection Time: 07/10/15  6:33 AM  Result Value Ref Range   WBC 7.3 4.0 - 10.5 K/uL   RBC 2.24 (L) 4.22 - 5.81 MIL/uL   Hemoglobin 7.7 (L) 13.0 - 17.0 g/dL   HCT 23.2 (L) 39.0 - 52.0 %   MCV 103.6 (H) 78.0 - 100.0 fL   MCH 34.4 (H) 26.0 - 34.0 pg   MCHC 33.2 30.0 - 36.0 g/dL   RDW 16.5 (H) 11.5 - 15.5 %   Platelets 96 (L) 150 - 400 K/uL    Comment: SPECIMEN CHECKED FOR CLOTS CONSISTENT WITH PREVIOUS RESULT   Basic metabolic panel     Status: Abnormal   Collection Time: 07/10/15  6:34 AM  Result Value Ref Range   Sodium 144 135 - 145 mmol/L   Potassium 3.4 (L) 3.5 - 5.1 mmol/L   Chloride 112 (H) 101 - 111 mmol/L   CO2 25 22 - 32 mmol/L   Glucose, Bld 196 (H) 65 - 99 mg/dL   BUN 44 (H) 6 - 20 mg/dL   Creatinine, Ser 3.68 (H) 0.61 - 1.24 mg/dL   Calcium 8.1 (L) 8.9 - 10.3 mg/dL   GFR calc non Af Amer 16 (L) >60 mL/min   GFR calc Af Amer 18 (L) >60 mL/min    Comment:  (NOTE) The eGFR has been calculated using the CKD EPI equation. This calculation has not been validated in all clinical situations. eGFR's persistently <60 mL/min signify possible Chronic Kidney Disease.    Anion gap 7 5 - 15  Phosphorus     Status: Abnormal   Collection Time: 07/10/15  6:34 AM  Result Value Ref Range   Phosphorus 2.2 (L) 2.5 - 4.6 mg/dL  Glucose, capillary     Status: Abnormal   Collection Time: 07/10/15  7:14 AM  Result Value Ref Range   Glucose-Capillary 168 (H) 65 - 99 mg/dL   Comment 1 Notify RN    Comment 2 Document in Chart     ABGS No results for input(s): PHART, PO2ART, TCO2, HCO3 in the last 72 hours.  Invalid input(s): PCO2 CULTURES No results found for this or any previous visit (from the past 240 hour(s)). Studies/Results: Ct Abdomen Pelvis Wo Contrast  07/08/2015   CLINICAL DATA:  Nausea and vomiting. Symptoms began with recent in urethral stricture dilation. Persistent nausea and vomiting.  EXAM: CT ABDOMEN AND PELVIS WITHOUT CONTRAST  TECHNIQUE: Multidetector CT imaging of the abdomen and pelvis was performed following the standard protocol without IV contrast.  COMPARISON:  10/05/2011.  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 No aggressive osseous lesions. L4-L5 and L5-S1 predominant degenerative disease of the lumbar spine.  Lung Bases: Atelectasis.  Liver: Unenhanced CT was performed per clinician order. Lack of IV contrast limits sensitivity and specificity, especially for evaluation of abdominal/pelvic solid viscera. Grossly normal.  Spleen:  Normal.  Gallbladder:  Normal.  Common bile duct:  Normal.  Pancreas:  Normal.  Adrenal glands:  Normal bilaterally.  Kidneys: Patulous renal collecting systems however there is no hydronephrosis. No calculi. Distal ureters are collapsed.  Stomach: Patulous distal thoracic esophagus. This is a nonspecific finding. Consider followup endoscopy if there are esophageal  symptoms.  Small bowel:  Grossly normal.  Colon: Normal appendix. No inflammatory changes or obstruction of colon.  Pelvic Genitourinary: Suprapubic catheter is present. Additionally, there is a Foley catheter with the balloon inflated at the bladder dome. There appears to be thickening of the bladder wall for the degree of distension, which is probably chronic.  Peritoneum: Normal.  No free air or free fluid.  Vascular/lymphatic: Atherosclerosis. Low attenuation of the intravascular compartment suggesting anemia. Coronary artery atherosclerosis is present. If office based assessment of coronary risk factors has not been performed, it is now recommended.  Body Wall: Tiny fat containing periumbilical hernia.  IMPRESSION: 1. No acute abdominal abnormality. 2. Suprapubic and Foley catheter with likely chronic bladder wall thickening. 3. Patulous distal thoracic esophagus.  See discussion above.   Electronically Signed   By: Dereck Ligas M.D.   On: 07/08/2015 19:32    Medications: I have reviewed the patient's current medications.  Assesment:   Active Problems:   DM (diabetes mellitus),  type 2, uncontrolled   Mental retardation   Dehydration   Acute renal failure superimposed on stage 4 chronic kidney disease   Nausea and vomiting   Essential hypertension   Hypernatremia   Hyperlipidemia   Nausea & vomiting   Protein-calorie malnutrition, severe    Plan:  Medications reviewed Will continue IV hydration Will type cross match  And transfuse 2 units of PRBC Nephrology consult appreciated.    LOS: 2 days   Astor Gentle 07/10/2015, 8:05 AM

## 2015-07-10 NOTE — Progress Notes (Signed)
Consent obtained for blood product infusion via telephone.  Consent given by Radene Gunning, legal guardian.  Witnessed by Geanie Berlin, RN.  Consent placed on chart.

## 2015-07-10 NOTE — Care Management Note (Signed)
Case Management Note  Patient Details  Name: Zachary [name] MRN: [id number] Date of Birth: 1946-02-06  Expected Discharge Date:    07/12/2015              Expected Discharge Plan:  Assisted Living / Rest Home  In-House Referral:  Clinical Social Work  Discharge planning Services  CM Consult  Post Acute Care Choice:  Resumption of Svcs/PTA Provider Choice offered to:     DME Arranged:    DME Agency:     HH Arranged:  RN [place] Agency:  [facility]  Status of Service:  In process, will continue to follow  Medicare Important Message Given:    Date Medicare IM Given:    Medicare IM give by:    Date Additional Medicare IM Given:    Additional Medicare Important Message give by:     If discussed at [place] of Stay Meetings, dates discussed:    Additional Comments: Pt from [facility] with [facility] RN through [facility]. Romualdo Bolk of [facility] made aware of admission and will obtain pt info from chart. Anticipate DC back to ALF with resumption of HH RN. Pt will need order to resume HH. Will cont to follow for DC planning.  Sherald Barge, RN 07/10/2015, 1:49 PM

## 2015-07-10 NOTE — Progress Notes (Addendum)
Zachary [name]  MRN: [id number]  DOB/AGE: [birth year] 69 y.o.  Primary Care Physician:FANTA,TESFAYE, MD  Admit date: 07/08/2015  Chief Complaint:  Chief Complaint  Patient presents with  . Emesis    S-Pt presented on  07/08/2015 with  Chief Complaint  Patient presents with  . Emesis  .    Pt offers no new complaints .     Meds . carvedilol  6.25 mg Oral BID WC  . enoxaparin (LOVENOX) injection  30 mg Subcutaneous Q24H  . feeding supplement  1 Container Oral TID BM  . insulin aspart  0-15 Units Subcutaneous TID WC  . insulin aspart  0-5 Units Subcutaneous QHS  . isosorbide-hydrALAZINE  1 tablet Oral TID  . levothyroxine  75 mcg Oral QAC breakfast  . metoCLOPramide (REGLAN) injection  5 mg Intravenous 4 times per day  . simvastatin  20 mg Oral QHS  . sodium chloride  3 mL Intravenous Q12H     Physical Exam: Vital signs in last 24 hours: Temp:  [98.3 F (36.8 C)-99.3 F (37.4 C)] 98.9 F (37.2 C) (09/13 1312) Pulse Rate:  [100-101] 100 (09/13 1312) Resp:  [20] 20 (09/13 1312) BP: (129-160)/(60-70) 145/64 mmHg (09/13 1312) SpO2:  [98 %-100 %] 100 % (09/13 1312) Weight change:  Last BM Date: 07/10/15 (per pt)  Intake/Output from previous day: 09/12 0701 - 09/13 0700 In: 1629.2 [P.O.:240; I.V.:1389.2] Out: 200 [Urine:200] Total I/O In: 2728.8 [P.O.:360; I.V.:2368.8] Out: -    Physical Exam: General- pt is awake, follow commands Resp- No acute REsp distress, CTA B/L NO Rhonchi CVS- S1S2 regular in rate and rhythm GIT- BS+, soft, NT, ND, Supra pubic cath insitu EXT- NO LE Edema, NO Cyanosis Access- AVG +  Lab Results: CBC  Recent Labs  07/09/15 0647 07/10/15 0633  WBC 10.0 7.3  HGB 8.6* 7.7*  HCT 25.5* 23.2*  PLT 114* 96*    BMET  Recent Labs  07/09/15 0647 07/10/15 0634  NA 148* 144  K 3.6 3.4*  CL 115* 112*  CO2 27 25  GLUCOSE 205* 196*  BUN 52* 44*  CREATININE 3.61* 3.68*  CALCIUM 8.5* 8.1*   Trend Creat     2016    4.87=>3.61-3.68       3.3--4.4( July /August admission)       6.67=>6.87=>6.34=>3.14( June admission)       3.69=>4.23=>3.67=>3.46--3.47( May admission) 2015   3.23==>1.67  MICRO No results found for this or any previous visit (from the past 240 hour(s)).    Lab Results  Component Value Date   PTH 130* 03/03/2015   PTH Comment 03/03/2015   CALCIUM 8.1* 07/10/2015   CAION 1.17 12/25/2013   PHOS 2.2* 07/10/2015          Renal u/s in 2015 1. Moderate bilateral hydronephrosis. There is also evidence of  hydroureter bilaterally. No obstructing masses nor appreciable  calculi identified. Clinical correlation recommended and if  clinically warranted further evaluation with renal protocol CT is  recommended.  2. Trabeculated bladder wall and enlarged prostate. These findings  may reflect sequela of a component of bladder outlet obstruction.  Clinical correlation recommended.  Renal u/s in May 2016 Right Kidney 14.6 cm. Diffusely echogenic. No significant change in dilatation of the renal collecting system.  Left Kidney:: 13.6 cm. Diffusely echogenic. No significant change in dilatation of the renal collecting system.  Bladder:Distended, with a calculated prevoid volume of 1137 cc and postvoid volume of 836 cc. The previously seen bladder mass is no  longer Visualized.  Renal U/s in June 2016 Right Kidney:  Length: 13.3 cm. Severe hydronephrosis similar to comparison exam. Increased renal cortical echogenicity.  Left Kidney:  Length: 12.7 cm. Severe hydronephrosis with increased cortical echogenicity. No significant change from prior.  Bladder:  The bladder is distended with the large which postvoid residual of 1120 cc disease.  IMPRESSION: 1. No change in bilateral severe hydronephrosis. 2. No change in distended bladder with large postvoid residual.  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 Phosphorus not  at goal.    On lower side, will follow  5)CNS-Hx of mental Retardation  PMD following  6)Electrolytes Normokalemic  Hypernatremia  improving, now much better   7)Acid base Co2  at goal.   Plan:  Will continue current care  Carlock S 07/10/2015, 3:16 PM

## 2015-07-11 LAB — GLUCOSE, CAPILLARY
Glucose-Capillary: 120 mg/dL — ABNORMAL HIGH (ref 65–99)
Glucose-Capillary: 188 mg/dL — ABNORMAL HIGH (ref 65–99)
Glucose-Capillary: 137 mg/dL — ABNORMAL HIGH (ref 65–99)
Glucose-Capillary: 143 mg/dL — ABNORMAL HIGH (ref 65–99)
Glucose-Capillary: 125 mg/dL — ABNORMAL HIGH (ref 65–99)

## 2015-07-11 LAB — TYPE AND SCREEN
ABO/RH(D): B POS
Antibody Screen: NEGATIVE
Unit division: 0
Unit division: 0

## 2015-07-11 LAB — CBC
HCT: 28.9 % — ABNORMAL LOW (ref 39.0–52.0)
Hemoglobin: 9.8 g/dL — ABNORMAL LOW (ref 13.0–17.0)
MCH: 32.5 pg (ref 26.0–34.0)
MCHC: 33.9 g/dL (ref 30.0–36.0)
MCV: 95.7 fL (ref 78.0–100.0)
Platelets: 83 10*3/uL — ABNORMAL LOW (ref 150–400)
RBC: 3.02 MIL/uL — ABNORMAL LOW (ref 4.22–5.81)
RDW: 20.4 % — ABNORMAL HIGH (ref 11.5–15.5)
WBC: 6.9 10*3/uL (ref 4.0–10.5)

## 2015-07-11 LAB — OCCULT BLOOD X 1 CARD TO LAB, STOOL: Fecal Occult Bld: POSITIVE — AB

## 2015-07-11 NOTE — Progress Notes (Signed)
Subjective: Patient is resting. Feels better. No vomiting but slightly nauseated. He is tolerating clear liquid diet. He was transfused 2 units of PRBC.  Objective: Vital signs in last 24 hours: Temp:  [98.3 F (36.8 C)-100.3 F (37.9 C)] 100.3 F (37.9 C) (09/14 0500) Pulse Rate:  [99-104] 99 (09/14 0500) Resp:  [15-20] 16 (09/14 0500) BP: (138-174)/(64-88) 174/88 mmHg (09/14 0500) SpO2:  [98 %-100 %] 100 % (09/14 0500) Weight change:  Last BM Date: 07/10/15  Intake/Output from previous day: 09/13 0701 - 09/14 0700 In: 3394.8 [P.O.:360; I.V.:2368.8; Blood:666] Out: 1100 [Urine:1100]  PHYSICAL EXAM General appearance: alert, no distress and slowed mentation Resp: clear to auscultation bilaterally Cardio: S1, S2 normal GI: soft, non-tender; bowel sounds normal; no masses,  no organomegaly Extremities: extremities normal, atraumatic, no cyanosis or edema  Lab Results:  Results for orders placed or performed during the hospital encounter of 07/08/15 (from the past 48 hour(s))  Glucose, capillary     Status: Abnormal   Collection Time: 07/09/15 11:18 AM  Result Value Ref Range   Glucose-Capillary 195 (H) 65 - 99 mg/dL  Glucose, capillary     Status: Abnormal   Collection Time: 07/09/15  4:07 PM  Result Value Ref Range   Glucose-Capillary 267 (H) 65 - 99 mg/dL  Glucose, capillary     Status: Abnormal   Collection Time: 07/09/15  8:35 PM  Result Value Ref Range   Glucose-Capillary 123 (H) 65 - 99 mg/dL  Lipase, blood     Status: None   Collection Time: 07/10/15  6:33 AM  Result Value Ref Range   Lipase 47 22 - 51 U/L  CBC     Status: Abnormal   Collection Time: 07/10/15  6:33 AM  Result Value Ref Range   WBC 7.3 4.0 - 10.5 K/uL   RBC 2.24 (L) 4.22 - 5.81 MIL/uL   Hemoglobin 7.7 (L) 13.0 - 17.0 g/dL   HCT 23.2 (L) 39.0 - 52.0 %   MCV 103.6 (H) 78.0 - 100.0 fL   MCH 34.4 (H) 26.0 - 34.0 pg   MCHC 33.2 30.0 - 36.0 g/dL   RDW 16.5 (H) 11.5 - 15.5 %   Platelets 96 (L)  150 - 400 K/uL    Comment: SPECIMEN CHECKED FOR CLOTS CONSISTENT WITH PREVIOUS RESULT   Basic metabolic panel     Status: Abnormal   Collection Time: 07/10/15  6:34 AM  Result Value Ref Range   Sodium 144 135 - 145 mmol/L   Potassium 3.4 (L) 3.5 - 5.1 mmol/L   Chloride 112 (H) 101 - 111 mmol/L   CO2 25 22 - 32 mmol/L   Glucose, Bld 196 (H) 65 - 99 mg/dL   BUN 44 (H) 6 - 20 mg/dL   Creatinine, Ser 3.68 (H) 0.61 - 1.24 mg/dL   Calcium 8.1 (L) 8.9 - 10.3 mg/dL   GFR calc non Af Amer 16 (L) >60 mL/min   GFR calc Af Amer 18 (L) >60 mL/min    Comment: (NOTE) The eGFR has been calculated using the CKD EPI equation. This calculation has not been validated in all clinical situations. eGFR's persistently <60 mL/min signify possible Chronic Kidney Disease.    Anion gap 7 5 - 15  Phosphorus     Status: Abnormal   Collection Time: 07/10/15  6:34 AM  Result Value Ref Range   Phosphorus 2.2 (L) 2.5 - 4.6 mg/dL  Glucose, capillary     Status: Abnormal   Collection Time: 07/10/15  7:14 AM  Result Value Ref Range   Glucose-Capillary 168 (H) 65 - 99 mg/dL   Comment 1 Notify RN    Comment 2 Document in Chart   Prepare RBC     Status: None   Collection Time: 07/10/15  8:58 AM  Result Value Ref Range   Order Confirmation ORDER PROCESSED BY BLOOD BANK   Type and screen     Status: None   Collection Time: 07/10/15  8:58 AM  Result Value Ref Range   ABO/RH(D) B POS    Antibody Screen NEG    Sample Expiration 07/13/2015    Unit Number [id number]    Blood Component Type RBC LR PHER2    Unit division 00    Status of Unit ISSUED,FINAL    Transfusion Status OK TO TRANSFUSE    Crossmatch Result Compatible    Unit Number [id number]    Blood Component Type RBC LR PHER2    Unit division 00    Status of Unit ISSUED,FINAL    Transfusion Status OK TO TRANSFUSE    Crossmatch Result Compatible   Glucose, capillary     Status: Abnormal   Collection Time: 07/10/15 11:03 AM  Result Value Ref  Range   Glucose-Capillary 187 (H) 65 - 99 mg/dL   Comment 1 Notify RN    Comment 2 Document in Chart   Glucose, capillary     Status: Abnormal   Collection Time: 07/10/15  4:28 PM  Result Value Ref Range   Glucose-Capillary 133 (H) 65 - 99 mg/dL   Comment 1 Notify RN    Comment 2 Document in Chart   Glucose, capillary     Status: Abnormal   Collection Time: 07/10/15  9:32 PM  Result Value Ref Range   Glucose-Capillary 129 (H) 65 - 99 mg/dL   Comment 1 Notify RN    Comment 2 Document in Chart   CBC     Status: Abnormal   Collection Time: 07/11/15  6:24 AM  Result Value Ref Range   WBC 6.9 4.0 - 10.5 K/uL   RBC 3.02 (L) 4.22 - 5.81 MIL/uL   Hemoglobin 9.8 (L) 13.0 - 17.0 g/dL    Comment: DELTA CHECK NOTED   HCT 28.9 (L) 39.0 - 52.0 %   MCV 95.7 78.0 - 100.0 fL    Comment: DELTA CHECK NOTED   MCH 32.5 26.0 - 34.0 pg   MCHC 33.9 30.0 - 36.0 g/dL   RDW 20.4 (H) 11.5 - 15.5 %   Platelets 83 (L) 150 - 400 K/uL    Comment: SPECIMEN CHECKED FOR CLOTS CONSISTENT WITH PREVIOUS RESULT   Glucose, capillary     Status: Abnormal   Collection Time: 07/11/15  7:38 AM  Result Value Ref Range   Glucose-Capillary 120 (H) 65 - 99 mg/dL   Comment 1 Notify RN    Comment 2 Document in Chart     ABGS No results for input(s): PHART, PO2ART, TCO2, HCO3 in the last 72 hours.  Invalid input(s): PCO2 CULTURES No results found for this or any previous visit (from the past 240 hour(s)). Studies/Results: No results found.  Medications: I have reviewed the patient's current medications.  Assesment:   Active Problems:   DM (diabetes mellitus), type 2, uncontrolled   Mental retardation   Dehydration   Acute renal failure superimposed on stage 4 chronic kidney disease   Nausea and vomiting   Essential hypertension   Hypernatremia   Hyperlipidemia   Nausea & vomiting  Protein-calorie malnutrition, severe    Plan:  Medications reviewed Will continue IV hydration Will advance  diet Will monitor CBC/BMP..    LOS: 3 days   Carys Malina 07/11/2015, 8:21 AM

## 2015-07-11 NOTE — Progress Notes (Addendum)
MAXIMILIAN HILDEBRANDT  MRN: [id number]  DOB/AGE: [birth year] 69 y.o.  Primary Care Physician:FANTA,TESFAYE, MD  Admit date: 07/08/2015  Chief Complaint:  Chief Complaint  Patient presents with  . Emesis    S-Pt presented on  07/08/2015 with  Chief Complaint  Patient presents with  . Emesis  .    Pt offers no new complaints .     Meds . carvedilol  6.25 mg Oral BID WC  . enoxaparin (LOVENOX) injection  30 mg Subcutaneous Q24H  . feeding supplement  1 Container Oral TID BM  . insulin aspart  0-15 Units Subcutaneous TID WC  . insulin aspart  0-5 Units Subcutaneous QHS  . isosorbide-hydrALAZINE  1 tablet Oral TID  . levothyroxine  75 mcg Oral QAC breakfast  . metoCLOPramide (REGLAN) injection  5 mg Intravenous 4 times per day  . simvastatin  20 mg Oral QHS  . sodium chloride  3 mL Intravenous Q12H     Physical Exam: Vital signs in last 24 hours: Temp:  [98.8 F (37.1 C)-100.3 F (37.9 C)] 98.8 F (37.1 C) (09/14 1410) Pulse Rate:  [97-104] 97 (09/14 1410) Resp:  [15-18] 18 (09/14 1410) BP: (137-174)/(64-88) 137/64 mmHg (09/14 1410) SpO2:  [98 %-100 %] 100 % (09/14 1410) Weight change:  Last BM Date: 07/10/15  Intake/Output from previous day: 09/13 0701 - 09/14 0700 In: 3394.8 [P.O.:360; I.V.:2368.8; Blood:666] Out: 1100 [Urine:1100]     Physical Exam: General- pt is awake, follow commands Resp- No acute REsp distress, CTA B/L NO Rhonchi CVS- S1S2 regular in rate and rhythm GIT- BS+, soft, NT, ND, Supra pubic cath insitu EXT- NO LE Edema, NO Cyanosis Access- AVG +  Lab Results: CBC  Recent Labs  07/10/15 0633 07/10/15 0634 07/11/15 [id number]  WBC 7.3 DUPLICATE REQUEST 6.9  HGB 7.7*  --  9.8*  HCT 23.2*  --  28.9*  PLT 96*  --  83*    BMET  Recent Labs  07/09/15 0647 07/10/15 0634  NA 148* 144  K 3.6 3.4*  CL 115* 112*  CO2 27 25  GLUCOSE 205* 196*  BUN 52* 44*  CREATININE 3.61* 3.68*  CALCIUM 8.5* 8.1*   Trend Creat     2016    4.87=>3.61-3.68       3.3--4.4( July /August admission)       6.67=>6.87=>6.34=>3.14( June admission)       3.69=>4.23=>3.67=>3.46--3.47( May admission) 2015   3.23==>1.67  MICRO No results found for this or any previous visit (from the past 240 hour(s)).    Lab Results  Component Value Date   PTH 130* 03/03/2015   PTH Comment 03/03/2015   CALCIUM 8.1* 07/10/2015   CAION 1.17 12/25/2013   PHOS 2.2* 07/10/2015          Renal u/s in 2015 1. Moderate bilateral hydronephrosis. There is also evidence of  hydroureter bilaterally. No obstructing masses nor appreciable  calculi identified. Clinical correlation recommended and if  clinically warranted further evaluation with renal protocol CT is  recommended.  2. Trabeculated bladder wall and enlarged prostate. These findings  may reflect sequela of a component of bladder outlet obstruction.  Clinical correlation recommended.  Renal u/s in May 2016 Right Kidney 14.6 cm. Diffusely echogenic. No significant change in dilatation of the renal collecting system.  Left Kidney:: 13.6 cm. Diffusely echogenic. No significant change in dilatation of the renal collecting system.  Bladder:Distended, with a calculated prevoid volume of 1137 cc and postvoid volume of 836 cc. The previously  seen bladder mass is no longer Visualized.  Renal U/s in June 2016 Right Kidney:  Length: 13.3 cm. Severe hydronephrosis similar to comparison exam. Increased renal cortical echogenicity.  Left Kidney:  Length: 12.7 cm. Severe hydronephrosis with increased cortical echogenicity. No significant change from prior.  Bladder:  The bladder is distended with the large which postvoid residual of 1120 cc disease.  IMPRESSION: 1. No change in bilateral severe hydronephrosis. 2. No change in distended bladder with large postvoid residual.  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 Phosphorus not  at goal.     On lower side   5)CNS-Hx of mental Retardation  PMD following  6)Electrolytes Normokalemic  Hypernatremia  improving, now much better   7)Acid base Co2  at goal.   Plan:  Will reduce IVf rate Will rechcek BMEt   BHUTANI,MANPREET S 07/11/2015, 8:33 PM

## 2015-07-12 ENCOUNTER — Encounter (HOSPITAL_COMMUNITY): Payer: Self-pay | Admitting: Gastroenterology

## 2015-07-12 DIAGNOSIS — R933 Abnormal findings on diagnostic imaging of other parts of digestive tract: Secondary | ICD-10-CM | POA: Insufficient documentation

## 2015-07-12 DIAGNOSIS — R195 Other fecal abnormalities: Secondary | ICD-10-CM | POA: Insufficient documentation

## 2015-07-12 DIAGNOSIS — D649 Anemia, unspecified: Secondary | ICD-10-CM

## 2015-07-12 LAB — BASIC METABOLIC PANEL
Anion gap: 5 (ref 5–15)
BUN: 33 mg/dL — ABNORMAL HIGH (ref 6–20)
CO2: 23 mmol/L (ref 22–32)
Calcium: 7.9 mg/dL — ABNORMAL LOW (ref 8.9–10.3)
Chloride: 112 mmol/L — ABNORMAL HIGH (ref 101–111)
Creatinine, Ser: 2.53 mg/dL — ABNORMAL HIGH (ref 0.61–1.24)
GFR calc Af Amer: 29 mL/min — ABNORMAL LOW (ref >60)
GFR calc non Af Amer: 25 mL/min — ABNORMAL LOW (ref >60)
Glucose, Bld: 116 mg/dL — ABNORMAL HIGH (ref 65–99)
Potassium: 3.5 mmol/L (ref 3.5–5.1)
Sodium: 140 mmol/L (ref 135–145)

## 2015-07-12 LAB — GLUCOSE, CAPILLARY
Glucose-Capillary: 115 mg/dL — ABNORMAL HIGH (ref 65–99)
Glucose-Capillary: 133 mg/dL — ABNORMAL HIGH (ref 65–99)
Glucose-Capillary: 104 mg/dL — ABNORMAL HIGH (ref 65–99)
Glucose-Capillary: 163 mg/dL — ABNORMAL HIGH (ref 65–99)

## 2015-07-12 LAB — CBC
HCT: 27.4 % — ABNORMAL LOW (ref 39.0–52.0)
Hemoglobin: 9.4 g/dL — ABNORMAL LOW (ref 13.0–17.0)
MCH: 32.6 pg (ref 26.0–34.0)
MCHC: 34.3 g/dL (ref 30.0–36.0)
MCV: 95.1 fL (ref 78.0–100.0)
Platelets: 71 10*3/uL — ABNORMAL LOW (ref 150–400)
RBC: 2.88 MIL/uL — ABNORMAL LOW (ref 4.22–5.81)
RDW: 19.5 % — ABNORMAL HIGH (ref 11.5–15.5)
WBC: 5.9 10*3/uL (ref 4.0–10.5)

## 2015-07-12 MED ORDER — PANTOPRAZOLE SODIUM 40 MG PO TBEC
40.0000 mg | DELAYED_RELEASE_TABLET | Freq: Every day | ORAL | Status: DC
  Administered 2015-07-12 – 2015-07-13 (×2): 40 mg via ORAL
  Filled 2015-07-12 (×2): qty 1

## 2015-07-12 NOTE — Progress Notes (Addendum)
Subjective: Patient presently denies any nausea or vomiting. His appetite is good and no difficulty in breathing.   Objective: Vital signs in last 24 hours: Temp:  [98.8 F (37.1 C)-100.4 F (38 C)] 99.5 F (37.5 C) (09/15 0518) Pulse Rate:  [92-103] 92 (09/15 0518) Resp:  [18-20] 18 (09/15 0518) BP: (137-147)/(64-84) 143/67 mmHg (09/15 0518) SpO2:  [100 %] 100 % (09/15 0518)  Intake/Output from previous day: 09/14 0701 - 09/15 0700 In: 3236.7 [P.O.:720; I.V.:2516.7] Out: 700 [Urine:700] Intake/Output this shift:     Recent Labs  07/10/15 0633 07/11/15 0624 07/12/15 0619  HGB 7.7* 9.8* 9.4*    Recent Labs  07/11/15 0624 07/12/15 0619  WBC 6.9 5.9  RBC 3.02* 2.88*  HCT 28.9* 27.4*  PLT 83* 71*    Recent Labs  07/10/15 0634 07/12/15 0619  NA 144 140  K 3.4* 3.5  CL 112* 112*  CO2 25 23  BUN 44* 33*  CREATININE 3.68* 2.53*  GLUCOSE 196* 116*  CALCIUM 8.1* 7.9*   No results for input(s): LABPT, INR in the last 72 hours.  Generally he is alert and in no apparent distress. Chest is clear to auscultation Heart exam reveals regular rate and rhythm no murmur Extremities no edema  Assessment/Plan: Problem #1 acute kidney injury superimposed on chronic. Presently his BUN and creatinine is progressively improving. Problem #2 hypernatremia: Sodium is normal Problem #3 anemia: Recommendation of iron deficiency anemia and anemia of chronic disease. His hemoglobin is low but stable. Patient with possible for GI bleeding. Patient Hemoccult-positive. Problem #4 diabetes Problem #5 hypertension: His blood pressure is reasonably controlled Problem #6 nausea and vomiting is much better. Problem #7 history of mental retardation Plan: 1] We'll continue his present management 2] check his basic metabolic panel and phosphorus in the morning.   Thamara Leger S 07/12/2015, 8:37 AM

## 2015-07-12 NOTE — Clinical Social Work Note (Signed)
CSW updated [name], DSS guardian and [name] at facility on pt. Recommendation for outpatient EGD/colonoscopy. [facility] remains willing to accept pt at d/c.   Benay Pike, [place]

## 2015-07-12 NOTE — Progress Notes (Signed)
Dressing around suprapubic catheter changed and site cleansed with NS dampened gauze.  Site had small amount of green drainage around it.

## 2015-07-12 NOTE — Consult Note (Addendum)
Referring Provider: [name], MD Primary Care Physician:  [name], MD Primary Gastroenterologist:    Reason for Consultation:  Anemia, heme + stool  HPI: Zachary [name] is a 69 y.o. male admitted on September 11 with nausea and vomiting. We were asked to see the patient today because of anemia (status post 2 units of packed red blood cells), Hemoccult-positive stool.  Patient presented to the emergency department on September 11 from assisted living facility. 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 He also has had suprapubic catheter. Since procedures he had persistent nausea and vomiting and unable to keep anything down. Since the emergency department for rising creatinine, hypernatremia and dehydration.  Patient is unable to provide any of his history. His brother is present in 2 unable to provide much history but refers me to Carrolyn Meiers another brother who helps take care of the patient.  Patient denies abdominal pain. Vomiting stopped. He denies any constipation or diarrhea, no melena or rectal bleeding. States he's never had a colonoscopy. States he does not want to have a colonoscopy because he has had "too many surgeries". Brother present with him today feels like he is unable to make this decision on his own because of his mental retardation.   Prior to Admission medications   Medication Sig Start Date End Date Taking? Authorizing Provider  carvedilol (COREG) 6.25 MG tablet Take 1 tablet (6.25 mg total) by mouth 2 (two) times daily with a meal. 03/08/15  Yes [name], MD  ferrous sulfate 325 (65 FE) MG tablet Take 325 mg by mouth daily with breakfast.   Yes Historical Provider, MD  HYDROcodone-acetaminophen (NORCO/VICODIN) 5-325 MG per tablet Take 1 tablet by mouth every 6 (six) hours as needed for moderate pain. 07/06/15  Yes [name], PA-C   isosorbide-hydrALAZINE (BIDIL) 20-37.5 MG per tablet Take 1 tablet by mouth 3 (three) times daily. Patient taking differently: Take 1 tablet by mouth 3 (three) times daily. 8am,2pm,8pm 03/08/15  Yes [name], MD  levothyroxine (SYNTHROID, LEVOTHROID) 75 MCG tablet Take 75 mcg by mouth daily before breakfast.    Yes Historical Provider, MD  linagliptin (TRADJENTA) 5 MG TABS tablet Take 5 mg by mouth daily.   Yes Historical Provider, MD  niacin (NIASPAN) 1000 MG CR tablet Take 1,000 mg by mouth at bedtime.   Yes Historical Provider, MD  simvastatin (ZOCOR) 20 MG tablet Take 20 mg by mouth at bedtime.  02/28/15  Yes Historical Provider, MD  sodium polystyrene (KAYEXALATE) 15 GM/60ML suspension Take 30 g by mouth once a week. Drink the entire contents of one bottle (172ml/30g) once weekly on Mondays   Yes Historical Provider, MD  torsemide (DEMADEX) 20 MG tablet Take 20 mg by mouth. Daily at 8am 06/13/15  Yes Historical Provider, MD  Vitamin D, Ergocalciferol, (DRISDOL) 50000 UNITS CAPS capsule Take 1 capsule (50,000 Units total) by mouth every 7 (seven) days. Patient taking differently: Take 50,000 Units by mouth every 7 (seven) days. On Monday 03/08/15  Yes [name], MD    Current Facility-Administered Medications  Medication Dose Route Frequency Provider Last Rate Last Dose  . acetaminophen (TYLENOL) tablet 650 mg  650 mg Oral Q6H PRN Kathie Dike, MD       Or  . acetaminophen (TYLENOL) suppository 650 mg  650 mg Rectal Q6H PRN Kathie Dike, MD      . carvedilol (COREG) tablet  6.25 mg  6.25 mg Oral BID WC Kathie Dike, MD   6.25 mg at 07/12/15 0837  . dextrose 5 %-0.45 % sodium chloride infusion   Intravenous Continuous Liana Gerold, MD 75 mL/hr at 07/11/15 2148    . feeding supplement (BOOST / RESOURCE BREEZE) liquid 1 Container  1 Container Oral TID BM Kathie Dike, MD   1 Container at 07/12/15 [id number]  . HYDROcodone-acetaminophen (NORCO/VICODIN) 5-325 MG per tablet 1 tablet  1  tablet Oral Q6H PRN Kathie Dike, MD      . insulin aspart (novoLOG) injection 0-15 Units  0-15 Units Subcutaneous TID WC Kathie Dike, MD   2 Units at 07/11/15 1708  . insulin aspart (novoLOG) injection 0-5 Units  0-5 Units Subcutaneous QHS Kathie Dike, MD   3 Units at 07/08/15 2144  . iohexol (OMNIPAQUE) 300 MG/ML solution 25 mL  25 mL Oral Once PRN Medication Radiologist, MD      . isosorbide-hydrALAZINE (BIDIL) 20-37.5 MG per tablet 1 tablet  1 tablet Oral TID Kathie Dike, MD   1 tablet at 07/12/15 0837  . levothyroxine (SYNTHROID, LEVOTHROID) tablet 75 mcg  75 mcg Oral QAC breakfast Kathie Dike, MD   75 mcg at 07/12/15 0841  . metoCLOPramide (REGLAN) injection 5 mg  5 mg Intravenous 4 times per day Kathie Dike, MD   5 mg at 07/12/15 0529  . ondansetron (ZOFRAN) tablet 4 mg  4 mg Oral Q6H PRN Kathie Dike, MD       Or  . ondansetron (ZOFRAN) injection 4 mg  4 mg Intravenous Q6H PRN Kathie Dike, MD      . pantoprazole (PROTONIX) EC tablet 40 mg  40 mg Oral Daily [name], MD   40 mg at 07/12/15 0841  . simvastatin (ZOCOR) tablet 20 mg  20 mg Oral QHS Kathie Dike, MD   20 mg at 07/11/15 2146  . sodium chloride 0.9 % injection 3 mL  3 mL Intravenous Q12H Kathie Dike, MD   3 mL at 07/12/15 1000    Allergies as of 07/08/2015  . (No Known Allergies)    Past Medical History  Diagnosis Date  . Hypertension   . Diabetes mellitus   . Smoking   . Abnormal CT scan, kidney 10/06/2011  . UTI (lower urinary tract infection) 10/06/2011  . Bladder wall thickening 10/06/2011  . 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 Poor historian poor historian  . BPH (benign prostatic hypertrophy)   . Anxiety     mental retardation  . Hypothyroidism   . MR (mental retardation)   . Acute pyelonephritis 10/07/2011  . Hyperkalemia   . Renal insufficiency     chronic history  . Renal failure (ARF), acute on chronic   . Anemia     normocytic  . Edema      history of lower extremity  edema  . Hyperlipidemia   . Impaired speech     Past Surgical History  Procedure Laterality Date  . Cystoscopy with urethral dilatation N/A 12/29/2013    Procedure: CYSTOSCOPY WITH URETHRAL DILATATION;  Surgeon: Marissa Nestle, MD;  Location: [facility];  Service: Urology;  Laterality: N/A;  . Transurethral resection of prostate N/A 01/04/2014    Procedure: TRANSURETHRAL RESECTION OF THE PROSTATE (TURP) (procedure #2);  Surgeon: Marissa Nestle, MD;  Location: [facility];  Service: Urology;  Laterality: N/A;  . Circumcision N/A 01/04/2014    Procedure: CIRCUMCISION ADULT (procedure #1);  Surgeon: Marissa Nestle, MD;  Location: [facility];  Service: Urology;  Laterality: N/A;  . Cystoscopy w/ retrogrades Bilateral 06/29/2015    Procedure: CYSTOSCOPY, DILATION OF URETHRAL STRICTURE WITH BILATERAL RETROGRADE PYELOGRAM,SUPRAPUBIC TUBE CHANGE;  Surgeon: Festus Aloe, MD;  Location: [facility];  Service: Urology;  Laterality: Bilateral;  . Av fistula placement Left 07/06/2015    Procedure:  INSERTION LEFT ARM ARTERIOVENOUS GORTEX GRAFT;  Surgeon: Angelia Mould, MD;  Location[facility];  Service: Vascular;  Laterality: Left;    Family History  Problem Relation Age of Onset  . Cancer Mother     Social History   Social History  . Marital Status: Single    Spouse Name: N/A  . Number of Children: N/A  . Years of Education: N/A   Occupational History  . Not on file.   Social History Main Topics  . Smoking status: Never Smoker   . Smokeless tobacco: Never Used  . Alcohol Use: No     Comment: occ. use   . Drug Use: No  . Sexual Activity: No   Other Topics Concern  . Not on file   Social History Narrative   Lives at assisted living facility     ROS: Gives negative review of systems.  General: Negative for anorexia, weight loss, fever, chills, fatigue, weakness. Eyes: Negative for vision changes.  ENT: Negative for hoarseness, difficulty swallowing , nasal congestion. CV: Negative for  chest pain, angina, palpitations, dyspnea on exertion, peripheral edema.  Respiratory: Negative for dyspnea at rest, dyspnea on exertion, cough, sputum, wheezing.  GI: See history of present illness. GU:  Negative for dysuria, hematuria, urinary incontinence, urinary frequency, nocturnal urination.  MS: Negative for joint pain, low back pain.  Derm: Negative for rash or itching.  Neuro: Negative for weakness, abnormal sensation, seizure, frequent headaches, memory loss, confusion.  Psych: Negative for anxiety, depression, suicidal ideation, hallucinations.  Endo: Negative for unusual weight change.  Heme: Negative for bruising or bleeding. Allergy: Negative for rash or hives.       Physical Examination: Vital signs in last 24 hours: Temp:  [98.8 F (37.1 C)-100.4 F (38 C)] 99.5 F (37.5 C) (09/15 0518) Pulse Rate:  [92-103] 92 (09/15 0518) Resp:  [18-20] 18 (09/15 0518) BP: (137-147)/(64-84) 143/67 mmHg (09/15 0518) SpO2:  [100 %] 100 % (09/15 0518) Last BM Date: 07/10/15  General: Well-nourished, well-developed in no acute distress.  Head: Normocephalic, atraumatic.   Eyes: Conjunctiva pink, no icterus. Mouth: Oropharyngeal mucosa moist and pink , no lesions erythema or exudate. Neck: Supple without thyromegaly, masses, or lymphadenopathy.  Lungs: Clear to auscultation bilaterally.  Heart: Regular rate and rhythm, no murmurs rubs or gallops.  Abdomen: Bowel sounds are normal, nontender, nondistended, no hepatosplenomegaly or masses, no abdominal bruits or    hernia , no rebound or guarding.   Rectal: Not performed Extremities: No lower extremity edema, clubbing, deformity.  Neuro: Alert and oriented x 4 , grossly normal neurologically.  Skin: Warm and dry, no rash or jaundice.   Psych: Alert and cooperative, normal mood and affect.        Intake/Output from previous day: 09/14 0701 - 09/15 0700 In: 3236.7 [P.O.:720; I.V.:2516.7] Out: 700 [Urine:700] Intake/Output this  shift: Total I/O In: 240 [P.O.:240] Out: -   Lab Results: CBC  Recent Labs  07/10/15 0633 07/10/15 0634 07/11/15 0624 07/12/15 [id number]  WBC 7.3 DUPLICATE REQUEST 6.9 5.9  HGB 7.7*  --  9.8* 9.4*  HCT 23.2*  --  28.9* 27.4*  MCV 103.6*  --  95.7 95.1  PLT 96*  --  83* 71*   BMET  Recent Labs  07/10/15 0634 07/12/15 0619  NA 144 140  K 3.4* 3.5  CL 112* 112*  CO2 25 23  GLUCOSE 196* 116*  BUN 44* 33*  CREATININE 3.68* 2.53*  CALCIUM 8.1* 7.9*   LFT No results for input(s): BILITOT, BILIDIR, IBILI, ALKPHOS, AST, ALT, PROT, ALBUMIN in the last 72 hours.  Lipase  Recent Labs  07/10/15 0633  LIPASE 47    PT/INR No results for input(s): LABPROT, INR in the last 72 hours.    Imaging Studies: Ct Abdomen Pelvis Wo Contrast  07/08/2015   CLINICAL DATA:  Nausea and vomiting. Symptoms began with recent in urethral stricture dilation. Persistent nausea and vomiting.  EXAM: CT ABDOMEN AND PELVIS WITHOUT CONTRAST  TECHNIQUE: Multidetector CT imaging of the abdomen and pelvis was performed following the standard protocol without IV contrast.  COMPARISON:  10/05/2011.  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 No aggressive osseous lesions. L4-L5 and L5-S1 predominant degenerative disease of the lumbar spine.  Lung Bases: Atelectasis.  Liver: Unenhanced CT was performed per clinician order. Lack of IV contrast limits sensitivity and specificity, especially for evaluation of abdominal/pelvic solid viscera. Grossly normal.  Spleen:  Normal.  Gallbladder:  Normal.  Common bile duct:  Normal.  Pancreas:  Normal.  Adrenal glands:  Normal bilaterally.  Kidneys: Patulous renal collecting systems however there is no hydronephrosis. No calculi. Distal ureters are collapsed.  Stomach: Patulous distal thoracic esophagus. This is a nonspecific finding. Consider followup endoscopy if there are esophageal symptoms.  Small bowel:  Grossly normal.  Colon:  Normal appendix. No inflammatory changes or obstruction of colon.  Pelvic Genitourinary: Suprapubic catheter is present. Additionally, there is a Foley catheter with the balloon inflated at the bladder dome. There appears to be thickening of the bladder wall for the degree of distension, which is probably chronic.  Peritoneum: Normal.  No free air or free fluid.  Vascular/lymphatic: Atherosclerosis. Low attenuation of the intravascular compartment suggesting anemia. Coronary artery atherosclerosis is present. If office based assessment of coronary risk factors has not been performed, it is now recommended.  Body Wall: Tiny fat containing periumbilical hernia.  IMPRESSION: 1. No acute abdominal abnormality. 2. Suprapubic and Foley catheter with likely chronic bladder wall thickening. 3. Patulous distal thoracic esophagus.  See discussion above.   Electronically Signed   By: Dereck Ligas M.D.   On: 07/08/2015 19:32  [4 week]   Impression: 69 year old gentleman admitted with protracted nausea and vomiting, dehydration which is now resolved. Hemoglobin at time of admission was 10.2. With hydration hemoglobin dropped to 7.7, he was given 2 units of packed red blood cells with appropriate response up to 9.8. Looking back through the records he has chronic anemia with hemoglobin consistently in the 7-8 range. Suspect recent drop in the setting of hemodilution/rehydration.  Patient Hemoccult-positive. No overt GI bleeding. No prior EGD or colonoscopy to her knowledge. Recent noncontrast CT showed patulous distal thoracic esophagus felt to be nonspecific but consider endoscopy if symptoms. This is in the setting of recent protracted nausea and vomiting.  Plan: 1. Patient should have elective colonoscopy and EGD. ?ward of state given her legal guardian listed as Malinda Spell. I have attempted to call number available without answer. If consent can be obtained, consider elective colonoscopy and EGD as outpatient.    We would like to thank you for the opportunity to participate in the care of  Shirlyn Goltz.  Laureen Ochs. Bernarda Caffey [facility] [phone] 9/15/201612:50 PM     LOS: 4 days  Attending note:  Patient seen and examined this evening. No family members or other responsible individuals present. Agree with above assessment and recommendations.

## 2015-07-12 NOTE — Progress Notes (Signed)
Subjective: Patient is resting. His nausea and vomiting has subsided. He is positive on occult blood test. He was transfused 2 units of PRBC but his H/H continue to drop.  Objective: Vital signs in last 24 hours: Temp:  [98.8 F (37.1 C)-100.4 F (38 C)] 99.5 F (37.5 C) (09/15 0518) Pulse Rate:  [92-103] 92 (09/15 0518) Resp:  [18-20] 18 (09/15 0518) BP: (137-147)/(64-84) 143/67 mmHg (09/15 0518) SpO2:  [100 %] 100 % (09/15 0518) Weight change:  Last BM Date: 07/10/15  Intake/Output from previous day: 09/14 0701 - 09/15 0700 In: 3236.7 [P.O.:720; I.V.:2516.7] Out: 700 [Urine:700]  PHYSICAL EXAM General appearance: alert, no distress and slowed mentation Resp: clear to auscultation bilaterally Cardio: S1, S2 normal GI: soft, non-tender; bowel sounds normal; no masses,  no organomegaly Extremities: extremities normal, atraumatic, no cyanosis or edema  Lab Results:  Results for orders placed or performed during the hospital encounter of 07/08/15 (from the past 48 hour(s))  Prepare RBC     Status: None   Collection Time: 07/10/15  8:58 AM  Result Value Ref Range   Order Confirmation ORDER PROCESSED BY BLOOD BANK   Type and screen     Status: None   Collection Time: 07/10/15  8:58 AM  Result Value Ref Range   ABO/RH(D) B POS    Antibody Screen NEG    Sample Expiration 07/13/2015    Unit Number [id number]    Blood Component Type RBC LR PHER2    Unit division 00    Status of Unit ISSUED,FINAL    Transfusion Status OK TO TRANSFUSE    Crossmatch Result Compatible    Unit Number [id number]    Blood Component Type RBC LR PHER2    Unit division 00    Status of Unit ISSUED,FINAL    Transfusion Status OK TO TRANSFUSE    Crossmatch Result Compatible   Glucose, capillary     Status: Abnormal   Collection Time: 07/10/15 11:03 AM  Result Value Ref Range   Glucose-Capillary 187 (H) 65 - 99 mg/dL   Comment 1 Notify RN    Comment 2 Document in Chart   Glucose, capillary      Status: Abnormal   Collection Time: 07/10/15  4:28 PM  Result Value Ref Range   Glucose-Capillary 133 (H) 65 - 99 mg/dL   Comment 1 Notify RN    Comment 2 Document in Chart   Glucose, capillary     Status: Abnormal   Collection Time: 07/10/15  9:32 PM  Result Value Ref Range   Glucose-Capillary 129 (H) 65 - 99 mg/dL   Comment 1 Notify RN    Comment 2 Document in Chart   CBC     Status: Abnormal   Collection Time: 07/11/15  6:24 AM  Result Value Ref Range   WBC 6.9 4.0 - 10.5 K/uL   RBC 3.02 (L) 4.22 - 5.81 MIL/uL   Hemoglobin 9.8 (L) 13.0 - 17.0 g/dL    Comment: DELTA CHECK NOTED   HCT 28.9 (L) 39.0 - 52.0 %   MCV 95.7 78.0 - 100.0 fL    Comment: DELTA CHECK NOTED   MCH 32.5 26.0 - 34.0 pg   MCHC 33.9 30.0 - 36.0 g/dL   RDW 20.4 (H) 11.5 - 15.5 %   Platelets 83 (L) 150 - 400 K/uL    Comment: SPECIMEN CHECKED FOR CLOTS CONSISTENT WITH PREVIOUS RESULT   Glucose, capillary     Status: Abnormal   Collection Time: 07/11/15  7:38  AM  Result Value Ref Range   Glucose-Capillary 120 (H) 65 - 99 mg/dL   Comment 1 Notify RN    Comment 2 Document in Chart   Glucose, capillary     Status: Abnormal   Collection Time: 07/11/15 11:45 AM  Result Value Ref Range   Glucose-Capillary 188 (H) 65 - 99 mg/dL   Comment 1 Notify RN    Comment 2 Document in Chart   Glucose, capillary     Status: Abnormal   Collection Time: 07/11/15  4:38 PM  Result Value Ref Range   Glucose-Capillary 143 (H) 65 - 99 mg/dL   Comment 1 Document in Chart   Glucose, capillary     Status: Abnormal   Collection Time: 07/11/15  4:43 PM  Result Value Ref Range   Glucose-Capillary 137 (H) 65 - 99 mg/dL   Comment 1 Notify RN   Occult blood card to lab, stool     Status: Abnormal   Collection Time: 07/11/15  6:20 PM  Result Value Ref Range   Fecal Occult Bld POSITIVE (A) NEGATIVE  Glucose, capillary     Status: Abnormal   Collection Time: 07/11/15  8:56 PM  Result Value Ref Range   Glucose-Capillary 125 (H) 65  - 99 mg/dL  CBC     Status: Abnormal   Collection Time: 07/12/15  6:19 AM  Result Value Ref Range   WBC 5.9 4.0 - 10.5 K/uL   RBC 2.88 (L) 4.22 - 5.81 MIL/uL   Hemoglobin 9.4 (L) 13.0 - 17.0 g/dL   HCT 27.4 (L) 39.0 - 52.0 %   MCV 95.1 78.0 - 100.0 fL   MCH 32.6 26.0 - 34.0 pg   MCHC 34.3 30.0 - 36.0 g/dL   RDW 19.5 (H) 11.5 - 15.5 %   Platelets 71 (L) 150 - 400 K/uL    Comment: SPECIMEN CHECKED FOR CLOTS CONSISTENT WITH PREVIOUS RESULT   Basic metabolic panel     Status: Abnormal   Collection Time: 07/12/15  6:19 AM  Result Value Ref Range   Sodium 140 135 - 145 mmol/L   Potassium 3.5 3.5 - 5.1 mmol/L   Chloride 112 (H) 101 - 111 mmol/L   CO2 23 22 - 32 mmol/L   Glucose, Bld 116 (H) 65 - 99 mg/dL   BUN 33 (H) 6 - 20 mg/dL   Creatinine, Ser 2.53 (H) 0.61 - 1.24 mg/dL    Comment: DELTA CHECK NOTED   Calcium 7.9 (L) 8.9 - 10.3 mg/dL   GFR calc non Af Amer 25 (L) >60 mL/min   GFR calc Af Amer 29 (L) >60 mL/min    Comment: (NOTE) The eGFR has been calculated using the CKD EPI equation. This calculation has not been validated in all clinical situations. eGFR's persistently <60 mL/min signify possible Chronic Kidney Disease.    Anion gap 5 5 - 15  Glucose, capillary     Status: Abnormal   Collection Time: 07/12/15  8:04 AM  Result Value Ref Range   Glucose-Capillary 115 (H) 65 - 99 mg/dL    ABGS No results for input(s): PHART, PO2ART, TCO2, HCO3 in the last 72 hours.  Invalid input(s): PCO2 CULTURES No results found for this or any previous visit (from the past 240 hour(s)). Studies/Results: No results found.  Medications: I have reviewed the patient's current medications.  Assesment:   Active Problems:   DM (diabetes mellitus), type 2, uncontrolled   Mental retardation   Dehydration   Acute renal failure  superimposed on stage 4 chronic kidney disease   Nausea and vomiting   Essential hypertension   Hypernatremia   Hyperlipidemia   Nausea & vomiting    Protein-calorie malnutrition, severe probably GI bleed  Plan:  Medications reviewed Will continue IV hydration GI consult Will start on protonox 40 mg po daily   LOS: 4 days   Zachary [name] 07/12/2015, 8:26 AM

## 2015-07-13 LAB — BASIC METABOLIC PANEL
Anion gap: 5 (ref 5–15)
BUN: 28 mg/dL — ABNORMAL HIGH (ref 6–20)
CO2: 23 mmol/L (ref 22–32)
Calcium: 8.4 mg/dL — ABNORMAL LOW (ref 8.9–10.3)
Chloride: 113 mmol/L — ABNORMAL HIGH (ref 101–111)
Creatinine, Ser: 2.3 mg/dL — ABNORMAL HIGH (ref 0.61–1.24)
GFR calc Af Amer: 32 mL/min — ABNORMAL LOW (ref >60)
GFR calc non Af Amer: 28 mL/min — ABNORMAL LOW (ref >60)
Glucose, Bld: 109 mg/dL — ABNORMAL HIGH (ref 65–99)
Potassium: 3.7 mmol/L (ref 3.5–5.1)
Sodium: 141 mmol/L (ref 135–145)

## 2015-07-13 LAB — CBC
HCT: 30.7 % — ABNORMAL LOW (ref 39.0–52.0)
Hemoglobin: 10.5 g/dL — ABNORMAL LOW (ref 13.0–17.0)
MCH: 32.7 pg (ref 26.0–34.0)
MCHC: 34.2 g/dL (ref 30.0–36.0)
MCV: 95.6 fL (ref 78.0–100.0)
Platelets: 92 10*3/uL — ABNORMAL LOW (ref 150–400)
RBC: 3.21 MIL/uL — ABNORMAL LOW (ref 4.22–5.81)
RDW: 18.5 % — ABNORMAL HIGH (ref 11.5–15.5)
WBC: 6.7 10*3/uL (ref 4.0–10.5)

## 2015-07-13 LAB — GLUCOSE, CAPILLARY: Glucose-Capillary: 114 mg/dL — ABNORMAL HIGH (ref 65–99)

## 2015-07-13 LAB — PHOSPHORUS: Phosphorus: 2.4 mg/dL — ABNORMAL LOW (ref 2.5–4.6)

## 2015-07-13 MED ORDER — PANTOPRAZOLE SODIUM 40 MG PO TBEC: 40.0000 mg | DELAYED_RELEASE_TABLET | Freq: Every day | ORAL | Status: DC

## 2015-07-13 NOTE — Care Management Note (Signed)
Case Management Note  Patient Details  Name: RAS SWINDELL MRN: [id number] Date of Birth: 30-Oct-1945  Subjective/Objective:                    Action/Plan:   Expected Discharge Date:                  Expected Discharge Plan:  Assisted Living / Rest Home  In-House Referral:  Clinical Social Work  Discharge planning Services  CM Consult  Post Acute Care Choice:  Resumption of Svcs/PTA Provider Choice offered to:     DME Arranged:    DME Agency:     HH Arranged:  RN [place] Agency:  [facility]  Status of Service:  Completed, signed off  Medicare Important Message Given:    Date Medicare IM Given:    Medicare IM give by:    Date Additional Medicare IM Given:    Additional Medicare Important Message give by:     If discussed at [place] of Stay Meetings, dates discussed:    Additional Comments: Pt discharged back to [facility] today with resumption of [facility] (for foley care). Romualdo Bolk of [facility] is aware and will collect the pts information from the chart. [place] services to start within 48 hours of discharge. No DME needs noted. CSW to arrange discharge to facility. Christinia Gully [place], RN 07/13/2015, 8:52 AM

## 2015-07-13 NOTE — Progress Notes (Signed)
Patient ready to be discharged.  He was dressed and a leg bag was placed for his supra pubic catheter.  Helene Kelp of the facility arrived to pick up the patient for transport.  She was given the d/c instructions and the packet.  She was instructed to give the packet to responsible receiving personal of the facility at [facility].  Helene Kelp verbalized understanding.  The patient left the floor via w/c with staff in stable condition.

## 2015-07-13 NOTE — Care Management Important Message (Signed)
Important Message  Patient Details  Name: Zachary [name] MRN: [id number] Date of Birth: Nov 23, 1945   Medicare Important Message Given:  Yes-second notification given    Joylene Draft, RN 07/13/2015, 8:56 AM

## 2015-07-13 NOTE — Progress Notes (Signed)
Patient for d/c today back to [facility]. Patient's legal guardian, [name] at [facility] office updated to plans as above. Facility will provide transportation @ [place], MSW, Manuel Garcia [phone]

## 2015-07-13 NOTE — Progress Notes (Signed)
Subjective: No new compliant. His appetite is goood.   Objective: Vital signs in last 24 hours: Temp:  [99.1 F (37.3 C)-99.8 F (37.7 C)] 99.1 F (37.3 C) (09/16 0607) Pulse Rate:  [92-100] 92 (09/16 0607) Resp:  [20] 20 (09/16 0607) BP: (115-170)/(69-87) 158/81 mmHg (09/16 0607) SpO2:  [97 %-100 %] 100 % (09/16 0607)  Intake/Output from previous day: 09/15 0701 - 09/16 0700 In: 1492.5 [P.O.:720; I.V.:772.5] Out: 1650 [Urine:1650] Intake/Output this shift:     Recent Labs  07/11/15 0624 07/12/15 0619  HGB 9.8* 9.4*    Recent Labs  07/11/15 0624 07/12/15 0619  WBC 6.9 5.9  RBC 3.02* 2.88*  HCT 28.9* 27.4*  PLT 83* 71*    Recent Labs  07/12/15 0619 07/13/15 0656  NA 140 141  K 3.5 3.7  CL 112* 113*  CO2 23 23  BUN 33* 28*  CREATININE 2.53* 2.30*  GLUCOSE 116* 109*  CALCIUM 7.9* 8.4*   No results for input(s): LABPT, INR in the last 72 hours.  Generally he is alert and in no apparent distress. Chest is clear to auscultation Heart exam reveals regular rate and rhythm no murmur Extremities no edema  Assessment/Plan: Problem #1 acute kidney injury superimposed on chronic. His renal function is progressively improvijg Problem #2 hypernatremia: Sodium is normal Problem #3 anemia: Recommendation of iron deficiency anemia and anemia of chronic disease.  Problem #4 diabetes Problem #5 hypertension: His blood pressure is reasonably controlled Problem #6 nausea and vomiting is much better.No dirrhea Problem #7 history of mental retardation Plan: 1] We'll continue his present management 2] check his basic metabolic panel and phosphorus in the morning.   Kameka Whan S 07/13/2015, 8:29 AM

## 2015-07-13 NOTE — Discharge Summary (Signed)
Physician Discharge Summary  Patient ID: Zachary [name] MRN: [id number] DOB/AGE: 1946/02/19 69 y.o. Primary Care Physician:Gaetana Kawahara, MD Admit date: 07/08/2015 Discharge date: 07/13/2015    Discharge Diagnoses:   Active Problems:   DM (diabetes mellitus), type 2, uncontrolled   Mental retardation   Dehydration   Acute renal failure superimposed on stage 4 chronic kidney disease   Nausea and vomiting   Essential hypertension   Hypernatremia   Hyperlipidemia   Nausea & vomiting   Protein-calorie malnutrition, severe   Heme positive stool   Absolute anemia   Abnormal CT scan, esophagus     Medication List    STOP taking these medications        sodium polystyrene 15 GM/60ML suspension  Commonly known as:  KAYEXALATE      TAKE these medications        carvedilol 6.25 MG tablet  Commonly known as:  COREG  Take 1 tablet (6.25 mg total) by mouth 2 (two) times daily with a meal.     ferrous sulfate 325 (65 FE) MG tablet  Take 325 mg by mouth daily with breakfast.     HYDROcodone-acetaminophen 5-325 MG per tablet  Commonly known as:  NORCO/VICODIN  Take 1 tablet by mouth every 6 (six) hours as needed for moderate pain.     isosorbide-hydrALAZINE 20-37.5 MG per tablet  Commonly known as:  BIDIL  Take 1 tablet by mouth 3 (three) times daily.     levothyroxine 75 MCG tablet  Commonly known as:  SYNTHROID, LEVOTHROID  Take 75 mcg by mouth daily before breakfast.     linagliptin 5 MG Tabs tablet  Commonly known as:  TRADJENTA  Take 5 mg by mouth daily.     niacin 1000 MG CR tablet  Commonly known as:  NIASPAN  Take 1,000 mg by mouth at bedtime.     pantoprazole 40 MG tablet  Commonly known as:  PROTONIX  Take 1 tablet (40 mg total) by mouth daily.     simvastatin 20 MG tablet  Commonly known as:  ZOCOR  Take 20 mg by mouth at bedtime.     torsemide 20 MG tablet  Commonly known as:  DEMADEX  Take 20 mg by mouth. Daily at 8am     Vitamin D  (Ergocalciferol) 50000 UNITS Caps capsule  Commonly known as:  DRISDOL  Take 1 capsule (50,000 Units total) by mouth every 7 (seven) days.        Discharged Condition: improved    Consults: GI  Significant Diagnostic Studies: Ct Abdomen Pelvis Wo Contrast  07/08/2015   CLINICAL DATA:  Nausea and vomiting. Symptoms began with recent in urethral stricture dilation. Persistent nausea and vomiting.  EXAM: CT ABDOMEN AND PELVIS WITHOUT CONTRAST  TECHNIQUE: Multidetector CT imaging of the abdomen and pelvis was performed following the standard protocol without IV contrast.  COMPARISON:  10/05/2011.  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 No aggressive osseous lesions. L4-L5 and L5-S1 predominant degenerative disease of the lumbar spine.  Lung Bases: Atelectasis.  Liver: Unenhanced CT was performed per clinician order. Lack of IV contrast limits sensitivity and specificity, especially for evaluation of abdominal/pelvic solid viscera. Grossly normal.  Spleen:  Normal.  Gallbladder:  Normal.  Common bile duct:  Normal.  Pancreas:  Normal.  Adrenal glands:  Normal bilaterally.  Kidneys: Patulous renal collecting systems however there is no hydronephrosis. No calculi. Distal ureters are collapsed.  Stomach: Patulous distal thoracic esophagus. This is a  nonspecific finding. Consider followup endoscopy if there are esophageal symptoms.  Small bowel:  Grossly normal.  Colon: Normal appendix. No inflammatory changes or obstruction of colon.  Pelvic Genitourinary: Suprapubic catheter is present. Additionally, there is a Foley catheter with the balloon inflated at the bladder dome. There appears to be thickening of the bladder wall for the degree of distension, which is probably chronic.  Peritoneum: Normal.  No free air or free fluid.  Vascular/lymphatic: Atherosclerosis. Low attenuation of the intravascular compartment suggesting anemia. Coronary artery  atherosclerosis is present. If office based assessment of coronary risk factors has not been performed, it is now recommended.  Body Wall: Tiny fat containing periumbilical hernia.  IMPRESSION: 1. No acute abdominal abnormality. 2. Suprapubic and Foley catheter with likely chronic bladder wall thickening. 3. Patulous distal thoracic esophagus.  See discussion above.   Electronically Signed   By: Dereck Ligas M.D.   On: 07/08/2015 19:32    Lab Results: Basic Metabolic Panel:  Recent Labs  07/12/15 0619 07/13/15 0656  NA 140 141  K 3.5 3.7  CL 112* 113*  CO2 23 23  GLUCOSE 116* 109*  BUN 33* 28*  CREATININE 2.53* 2.30*  CALCIUM 7.9* 8.4*  PHOS  --  2.4*   Liver Function Tests: No results for input(s): AST, ALT, ALKPHOS, BILITOT, PROT, ALBUMIN in the last 72 hours.   CBC:  Recent Labs  07/11/15 0624 07/12/15 0619  WBC 6.9 5.9  HGB 9.8* 9.4*  HCT 28.9* 27.4*  MCV 95.7 95.1  PLT 83* 71*    No results found for this or any previous visit (from the past 240 hour(s)).   Hospital Course:   This is a 69 years old male with history of multiple medical illnesses was admited due to nausea, vomiting and worsening of renal function. He was hydrated and treated symptomatically. He was evaluated by nephrologist who assisted in his treatement. He was transfused 2 units of PRBC due to drop in H/H. He was found also have + occult blood in his stool. Patient however, has no active bleeding. He was evaluated by GI who advised to EGD and colonoscopy on out patient. Patient is being discharged to assisted living in stable condition.  Discharge Exam: Blood pressure 158/81, pulse 92, temperature 99.1 F (37.3 C), temperature source Oral, resp. rate 20, height 5\' 7"  (1.702 m), weight 76.885 kg (169 lb 8 oz), SpO2 100 %.   Disposition:  [facility] assisted living.        Follow-up Information    Follow up with [facility], MD In 1 week.   Specialty:  Internal Medicine   Contact  information:   [place] [address] [phone]       Signed: [name]   07/13/2015, 8:27 AM

## 2015-07-17 DIAGNOSIS — Z79899 Other long term (current) drug therapy: Secondary | ICD-10-CM | POA: Diagnosis not present

## 2015-07-17 DIAGNOSIS — N184 Chronic kidney disease, stage 4 (severe): Secondary | ICD-10-CM | POA: Diagnosis not present

## 2015-07-17 DIAGNOSIS — R809 Proteinuria, unspecified: Secondary | ICD-10-CM | POA: Diagnosis not present

## 2015-07-17 DIAGNOSIS — I10 Essential (primary) hypertension: Secondary | ICD-10-CM | POA: Diagnosis not present

## 2015-07-17 DIAGNOSIS — E559 Vitamin D deficiency, unspecified: Secondary | ICD-10-CM | POA: Diagnosis not present

## 2015-07-18 DIAGNOSIS — I129 Hypertensive chronic kidney disease with stage 1 through stage 4 chronic kidney disease, or unspecified chronic kidney disease: Secondary | ICD-10-CM | POA: Diagnosis not present

## 2015-07-18 DIAGNOSIS — N184 Chronic kidney disease, stage 4 (severe): Secondary | ICD-10-CM | POA: Diagnosis not present

## 2015-07-18 DIAGNOSIS — Z95828 Presence of other vascular implants and grafts: Secondary | ICD-10-CM | POA: Diagnosis not present

## 2015-07-18 DIAGNOSIS — N400 Enlarged prostate without lower urinary tract symptoms: Secondary | ICD-10-CM | POA: Diagnosis not present

## 2015-07-18 DIAGNOSIS — E1165 Type 2 diabetes mellitus with hyperglycemia: Secondary | ICD-10-CM | POA: Diagnosis not present

## 2015-07-18 DIAGNOSIS — F79 Unspecified intellectual disabilities: Secondary | ICD-10-CM | POA: Diagnosis not present

## 2015-07-18 DIAGNOSIS — Z466 Encounter for fitting and adjustment of urinary device: Secondary | ICD-10-CM | POA: Diagnosis not present

## 2015-07-18 DIAGNOSIS — E1122 Type 2 diabetes mellitus with diabetic chronic kidney disease: Secondary | ICD-10-CM | POA: Diagnosis not present

## 2015-07-18 DIAGNOSIS — D649 Anemia, unspecified: Secondary | ICD-10-CM | POA: Diagnosis not present

## 2015-07-19 DIAGNOSIS — E10329 Type 1 diabetes mellitus with mild nonproliferative diabetic retinopathy without macular edema: Secondary | ICD-10-CM | POA: Diagnosis not present

## 2015-07-19 DIAGNOSIS — E10319 Type 1 diabetes mellitus with unspecified diabetic retinopathy without macular edema: Secondary | ICD-10-CM | POA: Diagnosis not present

## 2015-07-20 ENCOUNTER — Inpatient Hospital Stay (HOSPITAL_COMMUNITY)
Admission: EM | Admit: 2015-07-20 | Discharge: 2015-07-23 | DRG: 690 | Disposition: A | Discharge status: Home Health | Payer: Medicare Other | Attending: Internal Medicine | Admitting: Internal Medicine

## 2015-07-20 ENCOUNTER — Encounter (HOSPITAL_COMMUNITY): Payer: Self-pay | Admitting: Cardiology

## 2015-07-20 DIAGNOSIS — E039 Hypothyroidism, unspecified: Secondary | ICD-10-CM | POA: Diagnosis present

## 2015-07-20 DIAGNOSIS — D638 Anemia in other chronic diseases classified elsewhere: Secondary | ICD-10-CM | POA: Diagnosis present

## 2015-07-20 DIAGNOSIS — N184 Chronic kidney disease, stage 4 (severe): Secondary | ICD-10-CM | POA: Diagnosis not present

## 2015-07-20 DIAGNOSIS — I130 Hypertensive heart and chronic kidney disease with heart failure and stage 1 through stage 4 chronic kidney disease, or unspecified chronic kidney disease: Secondary | ICD-10-CM | POA: Diagnosis not present

## 2015-07-20 DIAGNOSIS — E785 Hyperlipidemia, unspecified: Secondary | ICD-10-CM | POA: Diagnosis present

## 2015-07-20 DIAGNOSIS — Z809 Family history of malignant neoplasm, unspecified: Secondary | ICD-10-CM

## 2015-07-20 DIAGNOSIS — I129 Hypertensive chronic kidney disease with stage 1 through stage 4 chronic kidney disease, or unspecified chronic kidney disease: Secondary | ICD-10-CM | POA: Diagnosis present

## 2015-07-20 DIAGNOSIS — D649 Anemia, unspecified: Secondary | ICD-10-CM | POA: Diagnosis not present

## 2015-07-20 DIAGNOSIS — R509 Fever, unspecified: Secondary | ICD-10-CM | POA: Diagnosis not present

## 2015-07-20 DIAGNOSIS — F79 Unspecified intellectual disabilities: Secondary | ICD-10-CM

## 2015-07-20 DIAGNOSIS — N390 Urinary tract infection, site not specified: Principal | ICD-10-CM | POA: Diagnosis present

## 2015-07-20 DIAGNOSIS — R195 Other fecal abnormalities: Secondary | ICD-10-CM

## 2015-07-20 DIAGNOSIS — E1165 Type 2 diabetes mellitus with hyperglycemia: Secondary | ICD-10-CM | POA: Diagnosis not present

## 2015-07-20 DIAGNOSIS — R339 Retention of urine, unspecified: Secondary | ICD-10-CM | POA: Diagnosis present

## 2015-07-20 DIAGNOSIS — I10 Essential (primary) hypertension: Secondary | ICD-10-CM | POA: Diagnosis not present

## 2015-07-20 DIAGNOSIS — Z466 Encounter for fitting and adjustment of urinary device: Secondary | ICD-10-CM | POA: Diagnosis not present

## 2015-07-20 DIAGNOSIS — N139 Obstructive and reflux uropathy, unspecified: Secondary | ICD-10-CM | POA: Diagnosis not present

## 2015-07-20 DIAGNOSIS — E1122 Type 2 diabetes mellitus with diabetic chronic kidney disease: Secondary | ICD-10-CM | POA: Diagnosis present

## 2015-07-20 DIAGNOSIS — N289 Disorder of kidney and ureter, unspecified: Secondary | ICD-10-CM

## 2015-07-20 LAB — COMPREHENSIVE METABOLIC PANEL WITH GFR
ALT: 14 U/L — ABNORMAL LOW (ref 17–63)
AST: 18 U/L (ref 15–41)
Albumin: 3.2 g/dL — ABNORMAL LOW (ref 3.5–5.0)
Alkaline Phosphatase: 43 U/L (ref 38–126)
Anion gap: 9 (ref 5–15)
BUN: 41 mg/dL — ABNORMAL HIGH (ref 6–20)
CO2: 23 mmol/L (ref 22–32)
Calcium: 8.3 mg/dL — ABNORMAL LOW (ref 8.9–10.3)
Chloride: 109 mmol/L (ref 101–111)
Creatinine, Ser: 3.82 mg/dL — ABNORMAL HIGH (ref 0.61–1.24)
GFR calc Af Amer: 18 mL/min — ABNORMAL LOW
GFR calc non Af Amer: 15 mL/min — ABNORMAL LOW
Glucose, Bld: 130 mg/dL — ABNORMAL HIGH (ref 65–99)
Potassium: 4.2 mmol/L (ref 3.5–5.1)
Sodium: 141 mmol/L (ref 135–145)
Total Bilirubin: 0.4 mg/dL (ref 0.3–1.2)
Total Protein: 6.6 g/dL (ref 6.5–8.1)

## 2015-07-20 LAB — CBC WITH DIFFERENTIAL/PLATELET
Basophils Absolute: 0 10*3/uL (ref 0.0–0.1)
Basophils Relative: 0 %
Eosinophils Absolute: 0.1 10*3/uL (ref 0.0–0.7)
Eosinophils Relative: 1 %
HCT: 25.7 % — ABNORMAL LOW (ref 39.0–52.0)
Hemoglobin: 8.5 g/dL — ABNORMAL LOW (ref 13.0–17.0)
Lymphocytes Relative: 18 %
Lymphs Abs: 1 10*3/uL (ref 0.7–4.0)
MCH: 32.2 pg (ref 26.0–34.0)
MCHC: 33.1 g/dL (ref 30.0–36.0)
MCV: 97.3 fL (ref 78.0–100.0)
Monocytes Absolute: 1.1 10*3/uL — ABNORMAL HIGH (ref 0.1–1.0)
Monocytes Relative: 19 %
Neutro Abs: 3.6 10*3/uL (ref 1.7–7.7)
Neutrophils Relative %: 62 %
Platelets: 172 10*3/uL (ref 150–400)
RBC: 2.64 MIL/uL — ABNORMAL LOW (ref 4.22–5.81)
RDW: 17.6 % — ABNORMAL HIGH (ref 11.5–15.5)
WBC: 5.8 10*3/uL (ref 4.0–10.5)

## 2015-07-20 LAB — GLUCOSE, CAPILLARY: Glucose-Capillary: 135 mg/dL — ABNORMAL HIGH (ref 65–99)

## 2015-07-20 LAB — URINE MICROSCOPIC-ADD ON

## 2015-07-20 LAB — URINALYSIS, ROUTINE W REFLEX MICROSCOPIC
Bilirubin Urine: NEGATIVE
Glucose, UA: NEGATIVE mg/dL
Hgb urine dipstick: NEGATIVE
Ketones, ur: NEGATIVE mg/dL
Nitrite: NEGATIVE
Protein, ur: 100 mg/dL — AB
Specific Gravity, Urine: 1.02 (ref 1.005–1.030)
Urobilinogen, UA: 0.2 mg/dL (ref 0.0–1.0)
pH: 6 (ref 5.0–8.0)

## 2015-07-20 LAB — I-STAT CG4 LACTIC ACID, ED: Lactic Acid, Venous: 0.56 mmol/L (ref 0.5–2.0)

## 2015-07-20 LAB — POC OCCULT BLOOD, ED: Fecal Occult Bld: POSITIVE — AB

## 2015-07-20 MED ORDER — LEVOTHYROXINE SODIUM 75 MCG PO TABS
75.0000 ug | ORAL_TABLET | Freq: Every day | ORAL | Status: DC
  Administered 2015-07-21 – 2015-07-23 (×3): 75 ug via ORAL
  Filled 2015-07-20 (×3): qty 1

## 2015-07-20 MED ORDER — SIMVASTATIN 20 MG PO TABS
20.0000 mg | ORAL_TABLET | Freq: Every day | ORAL | Status: DC
  Administered 2015-07-21 – 2015-07-22 (×3): 20 mg via ORAL
  Filled 2015-07-20 (×4): qty 1

## 2015-07-20 MED ORDER — ACETAMINOPHEN 325 MG PO TABS
650.0000 mg | ORAL_TABLET | Freq: Four times a day (QID) | ORAL | Status: DC | PRN
  Administered 2015-07-21: 650 mg via ORAL
  Filled 2015-07-20: qty 2

## 2015-07-20 MED ORDER — ONDANSETRON HCL 4 MG PO TABS: 4.0000 mg | ORAL_TABLET | Freq: Four times a day (QID) | ORAL | Status: DC | PRN

## 2015-07-20 MED ORDER — CARVEDILOL 3.125 MG PO TABS
6.2500 mg | ORAL_TABLET | Freq: Two times a day (BID) | ORAL | Status: DC
  Administered 2015-07-21 – 2015-07-23 (×4): 6.25 mg via ORAL
  Filled 2015-07-20 (×4): qty 2

## 2015-07-20 MED ORDER — DEXTROSE 5 % IV SOLN
1.0000 g | Freq: Once | INTRAVENOUS | Status: AC
  Administered 2015-07-20: 1 g via INTRAVENOUS
  Filled 2015-07-20: qty 10

## 2015-07-20 MED ORDER — ONDANSETRON HCL 4 MG/2ML IJ SOLN: 4.0000 mg | Freq: Four times a day (QID) | INTRAMUSCULAR | Status: DC | PRN

## 2015-07-20 MED ORDER — SODIUM CHLORIDE 0.9 % IV SOLN
INTRAVENOUS | Status: DC
  Administered 2015-07-21: 01:00:00 via INTRAVENOUS

## 2015-07-20 MED ORDER — SODIUM CHLORIDE 0.9 % IV BOLUS (SEPSIS)
1000.0000 mL | Freq: Once | INTRAVENOUS | Status: AC
  Administered 2015-07-20: 1000 mL via INTRAVENOUS

## 2015-07-20 MED ORDER — NIACIN ER (ANTIHYPERLIPIDEMIC) 500 MG PO TBCR
1000.0000 mg | EXTENDED_RELEASE_TABLET | Freq: Every day | ORAL | Status: DC
  Administered 2015-07-20 – 2015-07-22 (×3): 1000 mg via ORAL
  Filled 2015-07-20 (×5): qty 2

## 2015-07-20 MED ORDER — ACETAMINOPHEN 650 MG RE SUPP
650.0000 mg | Freq: Four times a day (QID) | RECTAL | Status: DC | PRN
Start: 2015-07-20 — End: 2015-07-23

## 2015-07-20 MED ORDER — PANTOPRAZOLE SODIUM 40 MG PO TBEC
40.0000 mg | DELAYED_RELEASE_TABLET | Freq: Every day | ORAL | Status: DC
  Administered 2015-07-21 – 2015-07-23 (×3): 40 mg via ORAL
  Filled 2015-07-20 (×3): qty 1

## 2015-07-20 MED ORDER — INSULIN ASPART 100 UNIT/ML ~~LOC~~ SOLN
0.0000 [IU] | Freq: Three times a day (TID) | SUBCUTANEOUS | Status: DC
  Administered 2015-07-21 – 2015-07-22 (×2): 1 [IU] via SUBCUTANEOUS
  Administered 2015-07-22: 2 [IU] via SUBCUTANEOUS

## 2015-07-20 MED ORDER — ISOSORB DINITRATE-HYDRALAZINE 20-37.5 MG PO TABS
1.0000 | ORAL_TABLET | Freq: Three times a day (TID) | ORAL | Status: DC
  Administered 2015-07-21 – 2015-07-23 (×6): 1 via ORAL
  Filled 2015-07-20 (×14): qty 1

## 2015-07-20 MED ORDER — ACETAMINOPHEN 500 MG PO TABS
1000.0000 mg | ORAL_TABLET | Freq: Once | ORAL | Status: AC
  Administered 2015-07-20: 1000 mg via ORAL
  Filled 2015-07-20: qty 2

## 2015-07-20 NOTE — ED Notes (Signed)
Pt comes from [facility]

## 2015-07-20 NOTE — Progress Notes (Signed)
ANTIBIOTIC CONSULT NOTE-Preliminary  Pharmacy Consult for rocephin Indication: uti  No Known Allergies  Patient Measurements: Weight: 169 lb (76.658 kg)  Vital Signs: Temp: 99.6 F (37.6 C) (09/23 2027) Temp Source: Oral (09/23 2027) BP: 130/73 mmHg (09/23 2027) Pulse Rate: 108 (09/23 2027)  Labs:  Recent Labs  07/20/15 1821  WBC 5.8  HGB 8.5*  PLT 172  CREATININE 3.82*    Estimated Creatinine Clearance: 17.8 mL/min (by C-G formula based on Cr of 3.82).  No results for input(s): VANCOTROUGH, VANCOPEAK, VANCORANDOM, GENTTROUGH, GENTPEAK, GENTRANDOM, TOBRATROUGH, TOBRAPEAK, TOBRARND, AMIKACINPEAK, AMIKACINTROU, AMIKACIN in the last 72 hours.   Microbiology: Recent Results (from the past 720 hour(s))  Blood Culture (routine x 2)     Status: None (Preliminary result)   Collection Time: 07/20/15  6:22 PM  Result Value Ref Range Status   Specimen Description BLOOD RIGHT ARM  Final   Special Requests   Final    BOTTLES DRAWN AEROBIC AND ANAEROBIC AEB 8CC ANA 6CC   Culture PENDING  Incomplete   Report Status PENDING  Incomplete  Blood Culture (routine x 2)     Status: None (Preliminary result)   Collection Time: 07/20/15  6:25 PM  Result Value Ref Range Status   Specimen Description BLOOD RIGHT HAND  Final   Special Requests BOTTLES DRAWN AEROBIC AND ANAEROBIC 3CC EACH  Final   Culture PENDING  Incomplete   Report Status PENDING  Incomplete    Medical History: Past Medical History  Diagnosis Date  . Hypertension   . Diabetes mellitus   . Smoking   . Abnormal CT scan, kidney 10/06/2011  . UTI (lower urinary tract infection) 10/06/2011  . Bladder wall thickening 10/06/2011  . 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 Poor historian poor historian  . BPH (benign prostatic hypertrophy)   . Anxiety     mental retardation  . Hypothyroidism   . MR (mental retardation)   . Acute pyelonephritis 10/07/2011  . Hyperkalemia   . Renal insufficiency     chronic history  .  Renal failure (ARF), acute on chronic   . Anemia     normocytic  . Edema      history of lower extremity edema  . Hyperlipidemia   . Impaired speech    Anti-infectives    Start     Dose/Rate Route Frequency Ordered Stop   07/20/15 1945  cefTRIAXone (ROCEPHIN) 1 g in dextrose 5 % 50 mL IVPB     1 g 100 mL/hr over 30 Minutes Intravenous  Once 07/20/15 1931 07/20/15 2054      Assessment: Estimated Creatinine Clearance: 17.8 mL/min (by C-G formula based on Cr of 3.82).   Goal of Therapy:  Eradicate infection.   Plan:  Preliminary review of pertinent patient information completed.  Protocol will be initiated with a one-time dose(s) of Rocephin 1gm [place] [facility] clinical pharmacist will complete review during morning rounds to assess patient and finalize treatment regimen.  Ena Dawley, [place] 07/20/2015,9:47 PM

## 2015-07-20 NOTE — H&P (Addendum)
PCP:   Zachary [name],TESFAYE, MD   Chief Complaint:  Fever  HPI: 69 -year-old male who   has a past medical history of Hypertension; Diabetes mellitus; Smoking; Abnormal CT scan, kidney (10/06/2011); UTI (lower urinary tract infection) (10/06/2011); Bladder wall thickening (10/06/2011); Perinephric abscess (10/07/2011); Poor historian (poor historian); BPH (benign prostatic hypertrophy); Anxiety; Hypothyroidism; MR (mental retardation); Acute pyelonephritis (10/07/2011); Hyperkalemia; Renal insufficiency; Renal failure (ARF), acute on chronic; Anemia; Edema; Hyperlipidemia; and Impaired speech. Patient has history of CKD stage IV and urinary retention with chronic Foley catheter in place. Patient recently had urethral stricture dilated as well as AV fistula placed 2 weeks ago. Patient was recently discharged on 07/13/2015 after he was admitted for nausea and vomiting and heme-positive stool. EGD and colonoscopy were recommended as outpatient.  Today patient came to the ED as he developed  Fever, though he denies any dysuria, no abdominal pain. No chest pain or shortness of breath. No cough. Denies runny nose or sore throat. In the ED patient was found to have abnormal UA and started on empiric Rocephin. Urine culture has been obtained. Today patient's hemoglobin is 8.5 and is down from 10.5 on 07/13/2015. FOBT is again positive today.  Allergies:  No Known Allergies    Past Medical History  Diagnosis Date  . Hypertension   . Diabetes mellitus   . Smoking   . Abnormal CT scan, kidney 10/06/2011  . UTI (lower urinary tract infection) 10/06/2011  . Bladder wall thickening 10/06/2011  . 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 Poor historian poor historian  . BPH (benign prostatic hypertrophy)   . Anxiety     mental retardation  . Hypothyroidism   . MR (mental retardation)   . Acute pyelonephritis 10/07/2011  . Hyperkalemia   . Renal insufficiency     chronic history  . Renal failure (ARF),  acute on chronic   . Anemia     normocytic  . Edema      history of lower extremity edema  . Hyperlipidemia   . Impaired speech     Past Surgical History  Procedure Laterality Date  . Cystoscopy with urethral dilatation N/A 12/29/2013    Procedure: CYSTOSCOPY WITH URETHRAL DILATATION;  Surgeon: Marissa Nestle, MD;  Location: [facility];  Service: Urology;  Laterality: N/A;  . Transurethral resection of prostate N/A 01/04/2014    Procedure: TRANSURETHRAL RESECTION OF THE PROSTATE (TURP) (procedure #2);  Surgeon: Marissa Nestle, MD;  Location: [facility];  Service: Urology;  Laterality: N/A;  . Circumcision N/A 01/04/2014    Procedure: CIRCUMCISION ADULT (procedure #1);  Surgeon: Marissa Nestle, MD;  Location: [facility];  Service: Urology;  Laterality: N/A;  . Cystoscopy w/ retrogrades Bilateral 06/29/2015    Procedure: CYSTOSCOPY, DILATION OF URETHRAL STRICTURE WITH BILATERAL RETROGRADE PYELOGRAM,SUPRAPUBIC TUBE CHANGE;  Surgeon: Festus Aloe, MD;  Location: [facility];  Service: Urology;  Laterality: Bilateral;  . Av fistula placement Left 07/06/2015    Procedure:  INSERTION LEFT ARM ARTERIOVENOUS GORTEX GRAFT;  Surgeon: Angelia Mould, MD;  Location: [place];  Service: Vascular;  Laterality: Left;    Prior to Admission medications   Medication Sig Start Date End Date Taking? Authorizing Provider  carvedilol (COREG) 6.25 MG tablet Take 1 tablet (6.25 mg total) by mouth 2 (two) times daily with a meal. 03/08/15  Yes [name], MD  ferrous sulfate 325 (65 FE) MG tablet Take 325 mg by mouth daily with breakfast.   Yes Historical Provider, MD  isosorbide-hydrALAZINE (BIDIL) 20-37.5  MG per tablet Take 1 tablet by mouth 3 (three) times daily. Patient taking differently: Take 1 tablet by mouth 3 (three) times daily. 8am,2pm,8pm 03/08/15  Yes [name], MD  levothyroxine (SYNTHROID, LEVOTHROID) 75 MCG tablet Take 75 mcg by mouth daily before breakfast.    Yes Historical Provider, MD    linagliptin (TRADJENTA) 5 MG TABS tablet Take 5 mg by mouth daily.   Yes Historical Provider, MD  niacin (NIASPAN) 1000 MG CR tablet Take 1,000 mg by mouth at bedtime.   Yes Historical Provider, MD  omeprazole (PRILOSEC) 20 MG capsule Take 40 mg by mouth daily.   Yes Historical Provider, MD  simvastatin (ZOCOR) 20 MG tablet Take 20 mg by mouth at bedtime.  02/28/15  Yes Historical Provider, MD  torsemide (DEMADEX) 20 MG tablet Take 20 mg by mouth daily. Daily at 8am 06/13/15  Yes Historical Provider, MD  Vitamin D, Ergocalciferol, (DRISDOL) 50000 UNITS CAPS capsule Take 1 capsule (50,000 Units total) by mouth every 7 (seven) days. Patient taking differently: Take 50,000 Units by mouth every 7 (seven) days. On Monday 03/08/15  Yes [name], MD  HYDROcodone-acetaminophen (NORCO/VICODIN) 5-325 MG per tablet Take 1 tablet by mouth every 6 (six) hours as needed for moderate pain. 07/06/15   Alvia Grove, PA-C    Social History:  reports that he has never smoked. He has never used smokeless tobacco. He reports that he does not drink alcohol or use illicit drugs.  Family History  Problem Relation Age of Onset  . Cancer Mother     Danley Danker Weights   07/20/15 1743  Weight: 76.658 kg (169 lb)    All the positives are listed in BOLD  Review of Systems:  HEENT: Headache, blurred vision, runny nose, sore throat Neck: Hypothyroidism, hyperthyroidism,,lymphadenopathy Chest : Shortness of breath, history of COPD, Asthma Heart : Chest pain, history of coronary arterey disease GI:  Nausea, vomiting, diarrhea, constipation, GERD, heme positive stool GU: Dysuria, urgency, frequency of urination, hematuria, urinary retention Neuro: Stroke, seizures, syncope Psych: Depression, anxiety, hallucinations   Physical Exam: Blood pressure 130/73, pulse 108, temperature 99.6 F (37.6 C), temperature source Oral, resp. rate 18, weight 76.658 kg (169 lb), SpO2 98 %. Constitutional:   Patient is a  well-developed and well-nourished male* in no acute distress and cooperative with exam. Head: Normocephalic and atraumatic Mouth: Mucus membranes moist Eyes: PERRL, EOMI, conjunctivae normal Neck: Supple, No Thyromegaly Cardiovascular: RRR, S1 normal, S2 normal Pulmonary/Chest: CTAB, no wheezes, rales, or rhonchi Abdominal: Soft. Non-tender, non-distended, bowel sounds are normal, no masses, organomegaly, or guarding present. Foley catheter in place  Neurological: A&O x3, Strength is normal and symmetric bilaterally, cranial nerve II-XII are grossly intact, no focal motor deficit, sensory intact to light touch bilaterally.  Extremities : No Cyanosis, Clubbing, trace edema of the lower extremities left more than right.  Left AV fistula in place, nontender to palpation no erythema noted  Labs on Admission:  Basic Metabolic Panel:  Recent Labs Lab 07/20/15 1821  NA 141  K 4.2  CL 109  CO2 23  GLUCOSE 130*  BUN 41*  CREATININE 3.82*  CALCIUM 8.3*   Liver Function Tests:  Recent Labs Lab 07/20/15 1821  AST 18  ALT 14*  ALKPHOS 43  BILITOT 0.4  PROT 6.6  ALBUMIN 3.2*   CBC:  Recent Labs Lab 07/20/15 1821  WBC 5.8  NEUTROABS 3.6  HGB 8.5*  HCT 25.7*  MCV 97.3  PLT 172  Assessment/Plan Active Problems:   DM (diabetes mellitus), type 2, uncontrolled   Mental retardation   Renal insufficiency   Anemia, chronic disease   Urinary retention   UTI (lower urinary tract infection)  UTI Patient presented with abnormal UA and fever, started on empiric Rocephin. Lactic acid 0.56 Follow urine culture results.  Diabetes mellitus We'll start sliding scale insulin with NovoLog Check blood glucose before meals and at bedtime  CKD stage IV Patient recently had AV fistula creation done on 07/06/15. Patient's BUN/creatinine today is 41/3.82 which is up from 28/2.30 Will start IV normal saline at 75 per hour. Hold Demadex at this time  Guaiac-positive  stool Patient has heme-positive stool, cream given is 8.5 down from previous smoker of 10.5 on 07/13/2015. Will check CBC in a.m. GI has seen the patient week ago and recommended outpatient EGD and colonoscopy  DVT prophylaxis SCD  Code status: Full code  Family discussion: Admission, patients condition and plan of care including tests being ordered have been discussed with the patient and *his daughter at bedside who indicate understanding and agree with the plan and Code Status.   Time Spent on Admission: 60 min  [facility] Pager: [phone] 07/20/2015, 8:48 PM  If 7PM-7AM, please contact night-coverage  [email or web address]  Password [email or web address]

## 2015-07-20 NOTE — ED Provider Notes (Signed)
CSN: [id number]     Arrival date & time 07/20/15  1731 History   First MD Initiated Contact with Patient 07/20/15 1756     Chief Complaint  Patient presents with  . Fever  . Arm Swelling     (Consider location/radiation/quality/duration/timing/severity/associated sxs/prior Treatment) Patient is a 69 y.o. male presenting with general illness. The history is provided by the patient.  Illness Severity:  Mild Onset quality:  Sudden Duration:  1 day Timing:  Constant Progression:  Unchanged Chronicity:  New Associated symptoms: fever   Associated symptoms: no abdominal pain, no chest pain, no congestion, no diarrhea, no headaches, no myalgias, no rash, no shortness of breath and no vomiting    69 yo M with a chief complaint of fever. Patient denies any other symptoms.Came to the ED for evaluation. Patient with AV fistula placed partially 2 weeks ago. Patient having no pain or issue with the site. Patient denies cough congestion dysuria. Fever started this morning.   Past Medical History  Diagnosis Date  . Hypertension   . Diabetes mellitus   . Smoking   . Abnormal CT scan, kidney 10/06/2011  . UTI (lower urinary tract infection) 10/06/2011  . Bladder wall thickening 10/06/2011  . 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 Poor historian poor historian  . BPH (benign prostatic hypertrophy)   . Anxiety     mental retardation  . Hypothyroidism   . MR (mental retardation)   . Acute pyelonephritis 10/07/2011  . Hyperkalemia   . Renal insufficiency     chronic history  . Renal failure (ARF), acute on chronic   . Anemia     normocytic  . Edema      history of lower extremity edema  . Hyperlipidemia   . Impaired speech    Past Surgical History  Procedure Laterality Date  . Cystoscopy with urethral dilatation N/A 12/29/2013    Procedure: CYSTOSCOPY WITH URETHRAL DILATATION;  Surgeon: Marissa Nestle, MD;  Location: [facility];  Service: Urology;  Laterality: N/A;  . Transurethral  resection of prostate N/A 01/04/2014    Procedure: TRANSURETHRAL RESECTION OF THE PROSTATE (TURP) (procedure #2);  Surgeon: Marissa Nestle, MD;  Location: [facility];  Service: Urology;  Laterality: N/A;  . Circumcision N/A 01/04/2014    Procedure: CIRCUMCISION ADULT (procedure #1);  Surgeon: Marissa Nestle, MD;  Location: [facility];  Service: Urology;  Laterality: N/A;  . Cystoscopy w/ retrogrades Bilateral 06/29/2015    Procedure: CYSTOSCOPY, DILATION OF URETHRAL STRICTURE WITH BILATERAL RETROGRADE PYELOGRAM,SUPRAPUBIC TUBE CHANGE;  Surgeon: Festus Aloe, MD;  Location: [facility];  Service: Urology;  Laterality: Bilateral;  . Av fistula placement Left 07/06/2015    Procedure:  INSERTION LEFT ARM ARTERIOVENOUS GORTEX GRAFT;  Surgeon: Angelia Mould, MD;  Location: [facility];  Service: Vascular;  Laterality: Left;   Family History  Problem Relation Age of Onset  . Cancer Mother    Social History  Substance Use Topics  . Smoking status: Never Smoker   . Smokeless tobacco: Never Used  . Alcohol Use: No     Comment: occ. use     Review of Systems  Constitutional: Positive for fever. Negative for chills.  HENT: Negative for congestion and facial swelling.   Eyes: Negative for discharge and visual disturbance.  Respiratory: Negative for shortness of breath.   Cardiovascular: Negative for chest pain and palpitations.  Gastrointestinal: Negative for vomiting, abdominal pain and diarrhea.  Musculoskeletal: Negative for myalgias and arthralgias.  Skin: Negative for color change  and rash.  Neurological: Negative for tremors, syncope and headaches.  Psychiatric/Behavioral: Negative for confusion and dysphoric mood.      Allergies  Review of patient's allergies indicates no known allergies.  Home Medications   Prior to Admission medications   Medication Sig Start Date End Date Taking? Authorizing Provider  carvedilol (COREG) 6.25 MG tablet Take 1 tablet (6.25 mg total) by mouth 2 (two)  times daily with a meal. 03/08/15  Yes [name], MD  ferrous sulfate 325 (65 FE) MG tablet Take 325 mg by mouth daily with breakfast.   Yes Historical Provider, MD  isosorbide-hydrALAZINE (BIDIL) 20-37.5 MG per tablet Take 1 tablet by mouth 3 (three) times daily. Patient taking differently: Take 1 tablet by mouth 3 (three) times daily. 8am,2pm,8pm 03/08/15  Yes [name], MD  levothyroxine (SYNTHROID, LEVOTHROID) 75 MCG tablet Take 75 mcg by mouth daily before breakfast.    Yes Historical Provider, MD  linagliptin (TRADJENTA) 5 MG TABS tablet Take 5 mg by mouth daily.   Yes Historical Provider, MD  niacin (NIASPAN) 1000 MG CR tablet Take 1,000 mg by mouth at bedtime.   Yes Historical Provider, MD  omeprazole (PRILOSEC) 20 MG capsule Take 40 mg by mouth daily.   Yes Historical Provider, MD  simvastatin (ZOCOR) 20 MG tablet Take 20 mg by mouth at bedtime.  02/28/15  Yes Historical Provider, MD  torsemide (DEMADEX) 20 MG tablet Take 20 mg by mouth daily. Daily at 8am 06/13/15  Yes Historical Provider, MD  Vitamin D, Ergocalciferol, (DRISDOL) 50000 UNITS CAPS capsule Take 1 capsule (50,000 Units total) by mouth every 7 (seven) days. Patient taking differently: Take 50,000 Units by mouth every 7 (seven) days. On Monday 03/08/15  Yes [name], MD  HYDROcodone-acetaminophen (NORCO/VICODIN) 5-325 MG per tablet Take 1 tablet by mouth every 6 (six) hours as needed for moderate pain. 07/06/15   Joelene Millin A Trinh, PA-C   BP 130/73 mmHg  Pulse 108  Temp(Src) 99.6 F (37.6 C) (Oral)  Resp 18  Wt 169 lb (76.658 kg)  SpO2 98% Physical Exam  Constitutional: He is oriented to person, place, and time. He appears well-developed and well-nourished.  HENT:  Head: Normocephalic and atraumatic.  Eyes: EOM are normal. Pupils are equal, round, and reactive to light.  Neck: Normal range of motion. Neck supple. No JVD present.  Cardiovascular: Normal rate and regular rhythm.  Exam reveals no gallop and no  friction rub.   No murmur heard. Pulmonary/Chest: No respiratory distress. He has no wheezes.  Abdominal: He exhibits no distension. There is no tenderness. There is no rebound and no guarding.  Genitourinary: Rectal exam shows no external hemorrhoid, no internal hemorrhoid, no fissure, no mass, no tenderness and anal tone normal. Guaiac positive stool.  Musculoskeletal: Normal range of motion.  Palpable thrill to left AV fistula, nontender no noted erythema  Neurological: He is alert and oriented to person, place, and time.  Skin: No rash noted. No pallor.  Psychiatric: He has a normal mood and affect. His behavior is normal.    ED Course  Procedures (including critical care time) Labs Review Labs Reviewed  COMPREHENSIVE METABOLIC PANEL - Abnormal; Notable for the following:    Glucose, Bld 130 (*)    BUN 41 (*)    Creatinine, Ser 3.82 (*)    Calcium 8.3 (*)    Albumin 3.2 (*)    ALT 14 (*)    GFR calc non Af Amer 15 (*)    GFR calc  Af Amer 18 (*)    All other components within normal limits  CBC WITH DIFFERENTIAL/PLATELET - Abnormal; Notable for the following:    RBC 2.64 (*)    Hemoglobin 8.5 (*)    HCT 25.7 (*)    RDW 17.6 (*)    Monocytes Absolute 1.1 (*)    All other components within normal limits  URINALYSIS, ROUTINE W REFLEX MICROSCOPIC (NOT AT [facility]) - Abnormal; Notable for the following:    Protein, ur 100 (*)    Leukocytes, UA MODERATE (*)    All other components within normal limits  URINE MICROSCOPIC-ADD ON - Abnormal; Notable for the following:    Squamous Epithelial / LPF FEW (*)    Bacteria, UA MANY (*)    Casts HYALINE CASTS (*)    All other components within normal limits  POC OCCULT BLOOD, ED - Abnormal; Notable for the following:    Fecal Occult Bld POSITIVE (*)    All other components within normal limits  CULTURE, BLOOD (ROUTINE X 2)  CULTURE, BLOOD (ROUTINE X 2)  URINE CULTURE  I-STAT CG4 LACTIC ACID, ED  I-STAT CG4 LACTIC ACID, ED    Imaging  Review No results found. I have personally reviewed and evaluated these images and lab results as part of my medical decision-making.   EKG Interpretation None      MDM   Final diagnoses:  UTI (lower urinary tract infection)  Heme positive stool    69 yo M with a chief complaint of fever. Patient denying any other symptoms. AV fistula with no signs of infection. Will obtain a chest x-ray urine. Blood culture. Tachycardic on arrival give IV fluids.  Patient found to have a 2g drop in hemaglobin from last week.  Rectal exam with soft brown stool Heme +.  Mild worsening in renal function.  Found to have urinary tract infection on UA. Treat with Rocephin. Sent for culture. Persistently tachycardic will admit.  The patients results and plan were reviewed and discussed.   Any x-rays performed were independently reviewed by myself.   Differential diagnosis were considered with the presenting HPI.  Medications  cefTRIAXone (ROCEPHIN) 1 g in dextrose 5 % 50 mL IVPB (1 g Intravenous New Bag/Given 07/20/15 2024)  sodium chloride 0.9 % bolus 1,000 mL (1,000 mLs Intravenous New Bag/Given 07/20/15 1826)  acetaminophen (TYLENOL) tablet 1,000 mg (1,000 mg Oral Given 07/20/15 1825)    Filed Vitals:   07/20/15 1743 07/20/15 2027  BP: 128/81 130/73  Pulse: 115 108  Temp: 100.3 F (37.9 C) 99.6 F (37.6 C)  TempSrc: Oral Oral  Resp: 18 18  Weight: 169 lb (76.658 kg)   SpO2: 100% 98%    Final diagnoses:  UTI (lower urinary tract infection)  Heme positive stool    Admission/ observation were discussed with the admitting physician, patient and/or family and they are comfortable with the plan.    Deno Etienne, DO 07/20/15 2032

## 2015-07-20 NOTE — ED Notes (Signed)
Lab at bedside

## 2015-07-20 NOTE — ED Notes (Signed)
Fever today.  Left arm swollen "for a while".  Dialysis graft in same arm.

## 2015-07-21 LAB — COMPREHENSIVE METABOLIC PANEL
ALT: 13 U/L — ABNORMAL LOW (ref 17–63)
AST: 14 U/L — ABNORMAL LOW (ref 15–41)
Albumin: 2.5 g/dL — ABNORMAL LOW (ref 3.5–5.0)
Alkaline Phosphatase: 38 U/L (ref 38–126)
Anion gap: 9 (ref 5–15)
BUN: 34 mg/dL — ABNORMAL HIGH (ref 6–20)
CO2: 21 mmol/L — ABNORMAL LOW (ref 22–32)
Calcium: 7.7 mg/dL — ABNORMAL LOW (ref 8.9–10.3)
Chloride: 114 mmol/L — ABNORMAL HIGH (ref 101–111)
Creatinine, Ser: 3.02 mg/dL — ABNORMAL HIGH (ref 0.61–1.24)
GFR calc Af Amer: 23 mL/min — ABNORMAL LOW (ref >60)
GFR calc non Af Amer: 20 mL/min — ABNORMAL LOW (ref >60)
Glucose, Bld: 150 mg/dL — ABNORMAL HIGH (ref 65–99)
Potassium: 3.8 mmol/L (ref 3.5–5.1)
Sodium: 144 mmol/L (ref 135–145)
Total Bilirubin: 0.5 mg/dL (ref 0.3–1.2)
Total Protein: 5.4 g/dL — ABNORMAL LOW (ref 6.5–8.1)

## 2015-07-21 LAB — GLUCOSE, CAPILLARY
Glucose-Capillary: 146 mg/dL — ABNORMAL HIGH (ref 65–99)
Glucose-Capillary: 108 mg/dL — ABNORMAL HIGH (ref 65–99)
Glucose-Capillary: 105 mg/dL — ABNORMAL HIGH (ref 65–99)
Glucose-Capillary: 138 mg/dL — ABNORMAL HIGH (ref 65–99)

## 2015-07-21 LAB — CBC
HCT: 24.5 % — ABNORMAL LOW (ref 39.0–52.0)
Hemoglobin: 8.2 g/dL — ABNORMAL LOW (ref 13.0–17.0)
MCH: 32.8 pg (ref 26.0–34.0)
MCHC: 33.5 g/dL (ref 30.0–36.0)
MCV: 98 fL (ref 78.0–100.0)
Platelets: 165 10*3/uL (ref 150–400)
RBC: 2.5 MIL/uL — ABNORMAL LOW (ref 4.22–5.81)
RDW: 17.4 % — ABNORMAL HIGH (ref 11.5–15.5)
WBC: 9.1 10*3/uL (ref 4.0–10.5)

## 2015-07-21 LAB — MRSA PCR SCREENING: MRSA by PCR: NEGATIVE

## 2015-07-21 MED ORDER — NIACIN ER 250 MG PO CPCR
ORAL_CAPSULE | ORAL | Status: AC
  Filled 2015-07-21: qty 4

## 2015-07-21 MED ORDER — ISOSORB DINITRATE-HYDRALAZINE 20-37.5 MG PO TABS
ORAL_TABLET | ORAL | Status: AC
  Filled 2015-07-21: qty 1

## 2015-07-21 MED ORDER — ISOSORB DINITRATE-HYDRALAZINE 20-37.5 MG PO TABS
ORAL_TABLET | ORAL | Status: AC
Start: 2015-07-21 — End: 2015-07-21
  Filled 2015-07-21: qty 1

## 2015-07-21 MED ORDER — DEXTROSE 5 % IV SOLN
1.0000 g | INTRAVENOUS | Status: DC
  Administered 2015-07-22: 1 g via INTRAVENOUS
  Filled 2015-07-21 (×3): qty 10

## 2015-07-21 NOTE — Progress Notes (Signed)
ANTIBIOTIC CONSULT NOTE- follow up  Pharmacy Consult for rocephin Indication: uti  No Known Allergies  Patient Measurements: Weight: 169 lb (76.658 kg)  Vital Signs: Temp: 99.1 F (37.3 C) (09/24 0618) Temp Source: Oral (09/24 0618) BP: 152/71 mmHg (09/24 0618) Pulse Rate: 102 (09/24 0805)  Labs:  Recent Labs  07/20/15 1821 07/21/15 0504  WBC 5.8 9.1  HGB 8.5* 8.2*  PLT 172 165  CREATININE 3.82* 3.02*    Estimated Creatinine Clearance: 22.5 mL/min (by C-G formula based on Cr of 3.02).  No results for input(s): VANCOTROUGH, VANCOPEAK, VANCORANDOM, GENTTROUGH, GENTPEAK, GENTRANDOM, TOBRATROUGH, TOBRAPEAK, TOBRARND, AMIKACINPEAK, AMIKACINTROU, AMIKACIN in the last 72 hours.   Microbiology: Recent Results (from the past 720 hour(s))  Blood Culture (routine x 2)     Status: None (Preliminary result)   Collection Time: 07/20/15  6:22 PM  Result Value Ref Range Status   Specimen Description BLOOD RIGHT ARM  Final   Special Requests   Final    BOTTLES DRAWN AEROBIC AND ANAEROBIC AEB 8CC ANA [place]   Culture NO GROWTH < 24 HOURS  Final   Report Status PENDING  Incomplete  Blood Culture (routine x 2)     Status: None (Preliminary result)   Collection Time: 07/20/15  6:25 PM  Result Value Ref Range Status   Specimen Description BLOOD RIGHT HAND  Final   Special Requests BOTTLES DRAWN AEROBIC AND ANAEROBIC 3CC EACH  Final   Culture NO GROWTH < 24 HOURS  Final   Report Status PENDING  Incomplete  MRSA PCR Screening     Status: None   Collection Time: 07/21/15  2:30 AM  Result Value Ref Range Status   MRSA by PCR NEGATIVE NEGATIVE Final    Comment:        The GeneXpert MRSA Assay (FDA approved for NASAL specimens only), is one component of a comprehensive MRSA colonization surveillance program. It is not intended to diagnose MRSA infection nor to guide or monitor treatment for MRSA infections.     Medical History: Past Medical History  Diagnosis Date  .  Hypertension   . Diabetes mellitus   . Smoking   . Abnormal CT scan, kidney 10/06/2011  . UTI (lower urinary tract infection) 10/06/2011  . Bladder wall thickening 10/06/2011  . 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 Poor historian poor historian  . BPH (benign prostatic hypertrophy)   . Anxiety     mental retardation  . Hypothyroidism   . MR (mental retardation)   . Acute pyelonephritis 10/07/2011  . Hyperkalemia   . Renal insufficiency     chronic history  . Renal failure (ARF), acute on chronic   . Anemia     normocytic  . Edema      history of lower extremity edema  . Hyperlipidemia   . Impaired speech    Anti-infectives    Start     Dose/Rate Route Frequency Ordered Stop   07/21/15 1800  cefTRIAXone (ROCEPHIN) 1 g in dextrose 5 % 50 mL IVPB     1 g 100 mL/hr over 30 Minutes Intravenous Every 24 hours 07/21/15 0735     07/20/15 1945  cefTRIAXone (ROCEPHIN) 1 g in dextrose 5 % 50 mL IVPB     1 g 100 mL/hr over 30 Minutes Intravenous  Once 07/20/15 1931 07/20/15 2054     Assessment: 69yo male, abnormal UA, h/o CKD.  Rocephin for UTI to be started.  Estimated Creatinine Clearance: 22.5 mL/min (by C-G formula based on Cr  of 3.02).  Goal of Therapy:  Eradicate infection.  Plan:  Rocephin 1gm IV q24hrs Monitor labs, progress, and c/s  Hart Robinsons A, RPH 07/21/2015,9:52 AM

## 2015-07-21 NOTE — Progress Notes (Signed)
Subjective: Patient was admitted due to fever secondary to UTI. Patient feels better today.   Objective: Vital signs in last 24 hours: Temp:  [99.1 F (37.3 C)-100.3 F (37.9 C)] 99.1 F (37.3 C) (09/24 0618) Pulse Rate:  [101-128] 102 (09/24 0805) Resp:  [18-20] 18 (09/24 0805) BP: (126-152)/(71-81) 152/71 mmHg (09/24 0618) SpO2:  [95 %-100 %] 95 % (09/24 0618) Weight:  [76.658 kg (169 lb)] 76.658 kg (169 lb) (09/23 1743) Weight change:  Last BM Date: 07/20/15  Intake/Output from previous day: 09/23 0701 - 09/24 0700 In: -  Out: 900 [Urine:900]  PHYSICAL EXAM General appearance: alert and no distress Resp: clear to auscultation bilaterally Cardio: S1, S2 normal GI: soft, non-tender; bowel sounds normal; no masses,  no organomegaly Extremities: extremities normal, atraumatic, no cyanosis or edema xx  Lab Results:  Results for orders placed or performed during the hospital encounter of 07/20/15 (from the past 48 hour(s))  Comprehensive metabolic panel     Status: Abnormal   Collection Time: 07/20/15  6:21 PM  Result Value Ref Range   Sodium 141 135 - 145 mmol/L   Potassium 4.2 3.5 - 5.1 mmol/L   Chloride 109 101 - 111 mmol/L   CO2 23 22 - 32 mmol/L   Glucose, Bld 130 (H) 65 - 99 mg/dL   BUN 41 (H) 6 - 20 mg/dL   Creatinine, Ser 3.82 (H) 0.61 - 1.24 mg/dL   Calcium 8.3 (L) 8.9 - 10.3 mg/dL   Total Protein 6.6 6.5 - 8.1 g/dL   Albumin 3.2 (L) 3.5 - 5.0 g/dL   AST 18 15 - 41 U/L   ALT 14 (L) 17 - 63 U/L   Alkaline Phosphatase 43 38 - 126 U/L   Total Bilirubin 0.4 0.3 - 1.2 mg/dL   GFR calc non Af Amer 15 (L) >60 mL/min   GFR calc Af Amer 18 (L) >60 mL/min    Comment: (NOTE) The eGFR has been calculated using the CKD EPI equation. This calculation has not been validated in all clinical situations. eGFR's persistently <60 mL/min signify possible Chronic Kidney Disease.    Anion gap 9 5 - 15  CBC WITH DIFFERENTIAL     Status: Abnormal   Collection Time: 07/20/15   6:21 PM  Result Value Ref Range   WBC 5.8 4.0 - 10.5 K/uL   RBC 2.64 (L) 4.22 - 5.81 MIL/uL   Hemoglobin 8.5 (L) 13.0 - 17.0 g/dL   HCT 25.7 (L) 39.0 - 52.0 %   MCV 97.3 78.0 - 100.0 fL   MCH 32.2 26.0 - 34.0 pg   MCHC 33.1 30.0 - 36.0 g/dL   RDW 17.6 (H) 11.5 - 15.5 %   Platelets 172 150 - 400 K/uL   Neutrophils Relative % 62 %   Neutro Abs 3.6 1.7 - 7.7 K/uL   Lymphocytes Relative 18 %   Lymphs Abs 1.0 0.7 - 4.0 K/uL   Monocytes Relative 19 %   Monocytes Absolute 1.1 (H) 0.1 - 1.0 K/uL   Eosinophils Relative 1 %   Eosinophils Absolute 0.1 0.0 - 0.7 K/uL   Basophils Relative 0 %   Basophils Absolute 0.0 0.0 - 0.1 K/uL  Blood Culture (routine x 2)     Status: None (Preliminary result)   Collection Time: 07/20/15  6:22 PM  Result Value Ref Range   Specimen Description BLOOD RIGHT ARM    Special Requests      BOTTLES DRAWN AEROBIC AND ANAEROBIC AEB 8CC ANA [place]  Culture NO GROWTH < 24 HOURS    Report Status PENDING   Blood Culture (routine x 2)     Status: None (Preliminary result)   Collection Time: 07/20/15  6:25 PM  Result Value Ref Range   Specimen Description BLOOD RIGHT HAND    Special Requests BOTTLES DRAWN AEROBIC AND ANAEROBIC 3CC EACH    Culture NO GROWTH < 24 HOURS    Report Status PENDING   I-Stat CG4 Lactic Acid, ED  (not at  [facility])     Status: None   Collection Time: 07/20/15  6:26 PM  Result Value Ref Range   Lactic Acid, Venous 0.56 0.5 - 2.0 mmol/L  Urinalysis, Routine w reflex microscopic (not at [facility])     Status: Abnormal   Collection Time: 07/20/15  6:50 PM  Result Value Ref Range   Color, Urine YELLOW YELLOW   APPearance CLEAR CLEAR   Specific Gravity, Urine 1.020 1.005 - 1.030   pH 6.0 5.0 - 8.0   Glucose, UA NEGATIVE NEGATIVE mg/dL   Hgb urine dipstick NEGATIVE NEGATIVE   Bilirubin Urine NEGATIVE NEGATIVE   Ketones, ur NEGATIVE NEGATIVE mg/dL   Protein, ur 100 (A) NEGATIVE mg/dL   Urobilinogen, UA 0.2 0.0 - 1.0 mg/dL   Nitrite NEGATIVE NEGATIVE    Leukocytes, UA MODERATE (A) NEGATIVE  Urine microscopic-add on     Status: Abnormal   Collection Time: 07/20/15  6:50 PM  Result Value Ref Range   Squamous Epithelial / LPF FEW (A) RARE   WBC, UA TOO NUMEROUS TO COUNT <3 WBC/hpf   RBC / HPF 0-2 <3 RBC/hpf   Bacteria, UA MANY (A) RARE   Casts HYALINE CASTS (A) NEGATIVE  POC occult blood, ED     Status: Abnormal   Collection Time: 07/20/15  7:39 PM  Result Value Ref Range   Fecal Occult Bld POSITIVE (A) NEGATIVE  Glucose, capillary     Status: Abnormal   Collection Time: 07/20/15 10:25 PM  Result Value Ref Range   Glucose-Capillary 135 (H) 65 - 99 mg/dL   Comment 1 Notify RN    Comment 2 Document in Chart   MRSA PCR Screening     Status: None   Collection Time: 07/21/15  2:30 AM  Result Value Ref Range   MRSA by PCR NEGATIVE NEGATIVE    Comment:        The GeneXpert MRSA Assay (FDA approved for NASAL specimens only), is one component of a comprehensive MRSA colonization surveillance program. It is not intended to diagnose MRSA infection nor to guide or monitor treatment for MRSA infections.   CBC     Status: Abnormal   Collection Time: 07/21/15  5:04 AM  Result Value Ref Range   WBC 9.1 4.0 - 10.5 K/uL   RBC 2.50 (L) 4.22 - 5.81 MIL/uL   Hemoglobin 8.2 (L) 13.0 - 17.0 g/dL   HCT 24.5 (L) 39.0 - 52.0 %   MCV 98.0 78.0 - 100.0 fL   MCH 32.8 26.0 - 34.0 pg   MCHC 33.5 30.0 - 36.0 g/dL   RDW 17.4 (H) 11.5 - 15.5 %   Platelets 165 150 - 400 K/uL  Comprehensive metabolic panel     Status: Abnormal   Collection Time: 07/21/15  5:04 AM  Result Value Ref Range   Sodium 144 135 - 145 mmol/L   Potassium 3.8 3.5 - 5.1 mmol/L   Chloride 114 (H) 101 - 111 mmol/L   CO2 21 (L) 22 - 32  mmol/L   Glucose, Bld 150 (H) 65 - 99 mg/dL   BUN 34 (H) 6 - 20 mg/dL   Creatinine, Ser 3.02 (H) 0.61 - 1.24 mg/dL   Calcium 7.7 (L) 8.9 - 10.3 mg/dL   Total Protein 5.4 (L) 6.5 - 8.1 g/dL   Albumin 2.5 (L) 3.5 - 5.0 g/dL   AST 14 (L) 15 - 41  U/L   ALT 13 (L) 17 - 63 U/L   Alkaline Phosphatase 38 38 - 126 U/L   Total Bilirubin 0.5 0.3 - 1.2 mg/dL   GFR calc non Af Amer 20 (L) >60 mL/min   GFR calc Af Amer 23 (L) >60 mL/min    Comment: (NOTE) The eGFR has been calculated using the CKD EPI equation. This calculation has not been validated in all clinical situations. eGFR's persistently <60 mL/min signify possible Chronic Kidney Disease.    Anion gap 9 5 - 15  Glucose, capillary     Status: Abnormal   Collection Time: 07/21/15  7:48 AM  Result Value Ref Range   Glucose-Capillary 146 (H) 65 - 99 mg/dL   Comment 1 Notify RN     ABGS No results for input(s): PHART, PO2ART, TCO2, HCO3 in the last 72 hours.  Invalid input(s): PCO2 CULTURES Recent Results (from the past 240 hour(s))  Blood Culture (routine x 2)     Status: None (Preliminary result)   Collection Time: 07/20/15  6:22 PM  Result Value Ref Range Status   Specimen Description BLOOD RIGHT ARM  Final   Special Requests   Final    BOTTLES DRAWN AEROBIC AND ANAEROBIC AEB 8CC ANA [place]   Culture NO GROWTH < 24 HOURS  Final   Report Status PENDING  Incomplete  Blood Culture (routine x 2)     Status: None (Preliminary result)   Collection Time: 07/20/15  6:25 PM  Result Value Ref Range Status   Specimen Description BLOOD RIGHT HAND  Final   Special Requests BOTTLES DRAWN AEROBIC AND ANAEROBIC 3CC EACH  Final   Culture NO GROWTH < 24 HOURS  Final   Report Status PENDING  Incomplete  MRSA PCR Screening     Status: None   Collection Time: 07/21/15  2:30 AM  Result Value Ref Range Status   MRSA by PCR NEGATIVE NEGATIVE Final    Comment:        The GeneXpert MRSA Assay (FDA approved for NASAL specimens only), is one component of a comprehensive MRSA colonization surveillance program. It is not intended to diagnose MRSA infection nor to guide or monitor treatment for MRSA infections.    Studies/Results: No results found.  Medications: I have reviewed the  patient's current medications.  Assesment:  Active Problems:   DM (diabetes mellitus), type 2, uncontrolled   Mental retardation   Renal insufficiency   Anemia, chronic disease   Urinary retention   UTI (lower urinary tract infection)    Plan:  Medications reviewed Will increase iv fluid to 100 cc/hr Continue iv antibiotics    LOS: 1 day   Mionna Advincula 07/21/2015, 10:40 AM

## 2015-07-22 LAB — BASIC METABOLIC PANEL
Anion gap: 6 (ref 5–15)
BUN: 32 mg/dL — ABNORMAL HIGH (ref 6–20)
CO2: 26 mmol/L (ref 22–32)
Calcium: 8.4 mg/dL — ABNORMAL LOW (ref 8.9–10.3)
Chloride: 111 mmol/L (ref 101–111)
Creatinine, Ser: 3.04 mg/dL — ABNORMAL HIGH (ref 0.61–1.24)
GFR calc Af Amer: 23 mL/min — ABNORMAL LOW (ref >60)
GFR calc non Af Amer: 20 mL/min — ABNORMAL LOW (ref >60)
Glucose, Bld: 121 mg/dL — ABNORMAL HIGH (ref 65–99)
Potassium: 4.3 mmol/L (ref 3.5–5.1)
Sodium: 143 mmol/L (ref 135–145)

## 2015-07-22 LAB — CBC
HCT: 25.6 % — ABNORMAL LOW (ref 39.0–52.0)
Hemoglobin: 8.3 g/dL — ABNORMAL LOW (ref 13.0–17.0)
MCH: 31.9 pg (ref 26.0–34.0)
MCHC: 32.4 g/dL (ref 30.0–36.0)
MCV: 98.5 fL (ref 78.0–100.0)
Platelets: 175 10*3/uL (ref 150–400)
RBC: 2.6 MIL/uL — ABNORMAL LOW (ref 4.22–5.81)
RDW: 17.3 % — ABNORMAL HIGH (ref 11.5–15.5)
WBC: 7.2 10*3/uL (ref 4.0–10.5)

## 2015-07-22 LAB — GLUCOSE, CAPILLARY
Glucose-Capillary: 153 mg/dL — ABNORMAL HIGH (ref 65–99)
Glucose-Capillary: 124 mg/dL — ABNORMAL HIGH (ref 65–99)
Glucose-Capillary: 111 mg/dL — ABNORMAL HIGH (ref 65–99)
Glucose-Capillary: 107 mg/dL — ABNORMAL HIGH (ref 65–99)

## 2015-07-22 LAB — URINE CULTURE: Culture: >=100000

## 2015-07-22 NOTE — Progress Notes (Signed)
Subjective: Patient is resting. His feels has subsided. He is growing gram negative rods greater than 100,000 colonies..   Objective: Vital signs in last 24 hours: Temp:  [98 F (36.7 C)-102.7 F (39.3 C)] 99.2 F (37.3 C) (09/25 0622) Pulse Rate:  [100-121] 104 (09/25 0622) Resp:  [18-20] 18 (09/25 0622) BP: (123-162)/(77-89) 140/82 mmHg (09/25 0622) SpO2:  [95 %-100 %] 99 % (09/25 0622) Weight change:  Last BM Date: 07/20/15  Intake/Output from previous day: 09/24 0701 - 09/25 0700 In: -  Out: 875 [Urine:875]  PHYSICAL EXAM General appearance: alert and no distress Resp: clear to auscultation bilaterally Cardio: S1, S2 normal GI: soft, non-tender; bowel sounds normal; no masses,  no organomegaly Extremities: extremities normal, atraumatic, no cyanosis or edema xx  Lab Results:  Results for orders placed or performed during the hospital encounter of 07/20/15 (from the past 48 hour(s))  Comprehensive metabolic panel     Status: Abnormal   Collection Time: 07/20/15  6:21 PM  Result Value Ref Range   Sodium 141 135 - 145 mmol/L   Potassium 4.2 3.5 - 5.1 mmol/L   Chloride 109 101 - 111 mmol/L   CO2 23 22 - 32 mmol/L   Glucose, Bld 130 (H) 65 - 99 mg/dL   BUN 41 (H) 6 - 20 mg/dL   Creatinine, Ser 3.82 (H) 0.61 - 1.24 mg/dL   Calcium 8.3 (L) 8.9 - 10.3 mg/dL   Total Protein 6.6 6.5 - 8.1 g/dL   Albumin 3.2 (L) 3.5 - 5.0 g/dL   AST 18 15 - 41 U/L   ALT 14 (L) 17 - 63 U/L   Alkaline Phosphatase 43 38 - 126 U/L   Total Bilirubin 0.4 0.3 - 1.2 mg/dL   GFR calc non Af Amer 15 (L) >60 mL/min   GFR calc Af Amer 18 (L) >60 mL/min    Comment: (NOTE) The eGFR has been calculated using the CKD EPI equation. This calculation has not been validated in all clinical situations. eGFR's persistently <60 mL/min signify possible Chronic Kidney Disease.    Anion gap 9 5 - 15  CBC WITH DIFFERENTIAL     Status: Abnormal   Collection Time: 07/20/15  6:21 PM  Result Value Ref Range    WBC 5.8 4.0 - 10.5 K/uL   RBC 2.64 (L) 4.22 - 5.81 MIL/uL   Hemoglobin 8.5 (L) 13.0 - 17.0 g/dL   HCT 25.7 (L) 39.0 - 52.0 %   MCV 97.3 78.0 - 100.0 fL   MCH 32.2 26.0 - 34.0 pg   MCHC 33.1 30.0 - 36.0 g/dL   RDW 17.6 (H) 11.5 - 15.5 %   Platelets 172 150 - 400 K/uL   Neutrophils Relative % 62 %   Neutro Abs 3.6 1.7 - 7.7 K/uL   Lymphocytes Relative 18 %   Lymphs Abs 1.0 0.7 - 4.0 K/uL   Monocytes Relative 19 %   Monocytes Absolute 1.1 (H) 0.1 - 1.0 K/uL   Eosinophils Relative 1 %   Eosinophils Absolute 0.1 0.0 - 0.7 K/uL   Basophils Relative 0 %   Basophils Absolute 0.0 0.0 - 0.1 K/uL  Blood Culture (routine x 2)     Status: None (Preliminary result)   Collection Time: 07/20/15  6:22 PM  Result Value Ref Range   Specimen Description BLOOD RIGHT ARM    Special Requests      BOTTLES DRAWN AEROBIC AND ANAEROBIC AEB 8CC ANA 6CC   Culture NO GROWTH 2 DAYS  Report Status PENDING   Blood Culture (routine x 2)     Status: None (Preliminary result)   Collection Time: 07/20/15  6:25 PM  Result Value Ref Range   Specimen Description BLOOD RIGHT HAND    Special Requests BOTTLES DRAWN AEROBIC AND ANAEROBIC 3CC EACH    Culture NO GROWTH 2 DAYS    Report Status PENDING   I-Stat CG4 Lactic Acid, ED  (not at  [facility])     Status: None   Collection Time: 07/20/15  6:26 PM  Result Value Ref Range   Lactic Acid, Venous 0.56 0.5 - 2.0 mmol/L  Urinalysis, Routine w reflex microscopic (not at [facility])     Status: Abnormal   Collection Time: 07/20/15  6:50 PM  Result Value Ref Range   Color, Urine YELLOW YELLOW   APPearance CLEAR CLEAR   Specific Gravity, Urine 1.020 1.005 - 1.030   pH 6.0 5.0 - 8.0   Glucose, UA NEGATIVE NEGATIVE mg/dL   Hgb urine dipstick NEGATIVE NEGATIVE   Bilirubin Urine NEGATIVE NEGATIVE   Ketones, ur NEGATIVE NEGATIVE mg/dL   Protein, ur 100 (A) NEGATIVE mg/dL   Urobilinogen, UA 0.2 0.0 - 1.0 mg/dL   Nitrite NEGATIVE NEGATIVE   Leukocytes, UA MODERATE (A) NEGATIVE   Urine culture     Status: None (Preliminary result)   Collection Time: 07/20/15  6:50 PM  Result Value Ref Range   Specimen Description URINE, RANDOM    Special Requests NONE    Culture      >=100,000 COLONIES/mL GRAM NEGATIVE RODS Performed at [facility]    Report Status PENDING   Urine microscopic-add on     Status: Abnormal   Collection Time: 07/20/15  6:50 PM  Result Value Ref Range   Squamous Epithelial / LPF FEW (A) RARE   WBC, UA TOO NUMEROUS TO COUNT <3 WBC/hpf   RBC / HPF 0-2 <3 RBC/hpf   Bacteria, UA MANY (A) RARE   Casts HYALINE CASTS (A) NEGATIVE  POC occult blood, ED     Status: Abnormal   Collection Time: 07/20/15  7:39 PM  Result Value Ref Range   Fecal Occult Bld POSITIVE (A) NEGATIVE  Glucose, capillary     Status: Abnormal   Collection Time: 07/20/15 10:25 PM  Result Value Ref Range   Glucose-Capillary 135 (H) 65 - 99 mg/dL   Comment 1 Notify RN    Comment 2 Document in Chart   MRSA PCR Screening     Status: None   Collection Time: 07/21/15  2:30 AM  Result Value Ref Range   MRSA by PCR NEGATIVE NEGATIVE    Comment:        The GeneXpert MRSA Assay (FDA approved for NASAL specimens only), is one component of a comprehensive MRSA colonization surveillance program. It is not intended to diagnose MRSA infection nor to guide or monitor treatment for MRSA infections.   CBC     Status: Abnormal   Collection Time: 07/21/15  5:04 AM  Result Value Ref Range   WBC 9.1 4.0 - 10.5 K/uL   RBC 2.50 (L) 4.22 - 5.81 MIL/uL   Hemoglobin 8.2 (L) 13.0 - 17.0 g/dL   HCT 24.5 (L) 39.0 - 52.0 %   MCV 98.0 78.0 - 100.0 fL   MCH 32.8 26.0 - 34.0 pg   MCHC 33.5 30.0 - 36.0 g/dL   RDW 17.4 (H) 11.5 - 15.5 %   Platelets 165 150 - 400 K/uL  Comprehensive metabolic panel  Status: Abnormal   Collection Time: 07/21/15  5:04 AM  Result Value Ref Range   Sodium 144 135 - 145 mmol/L   Potassium 3.8 3.5 - 5.1 mmol/L   Chloride 114 (H) 101 - 111 mmol/L   CO2 21  (L) 22 - 32 mmol/L   Glucose, Bld 150 (H) 65 - 99 mg/dL   BUN 34 (H) 6 - 20 mg/dL   Creatinine, Ser 3.02 (H) 0.61 - 1.24 mg/dL   Calcium 7.7 (L) 8.9 - 10.3 mg/dL   Total Protein 5.4 (L) 6.5 - 8.1 g/dL   Albumin 2.5 (L) 3.5 - 5.0 g/dL   AST 14 (L) 15 - 41 U/L   ALT 13 (L) 17 - 63 U/L   Alkaline Phosphatase 38 38 - 126 U/L   Total Bilirubin 0.5 0.3 - 1.2 mg/dL   GFR calc non Af Amer 20 (L) >60 mL/min   GFR calc Af Amer 23 (L) >60 mL/min    Comment: (NOTE) The eGFR has been calculated using the CKD EPI equation. This calculation has not been validated in all clinical situations. eGFR's persistently <60 mL/min signify possible Chronic Kidney Disease.    Anion gap 9 5 - 15  Glucose, capillary     Status: Abnormal   Collection Time: 07/21/15  7:48 AM  Result Value Ref Range   Glucose-Capillary 146 (H) 65 - 99 mg/dL   Comment 1 Notify RN   Glucose, capillary     Status: Abnormal   Collection Time: 07/21/15 12:21 PM  Result Value Ref Range   Glucose-Capillary 108 (H) 65 - 99 mg/dL  Glucose, capillary     Status: Abnormal   Collection Time: 07/21/15  4:27 PM  Result Value Ref Range   Glucose-Capillary 105 (H) 65 - 99 mg/dL   Comment 1 Notify RN   Glucose, capillary     Status: Abnormal   Collection Time: 07/21/15  8:54 PM  Result Value Ref Range   Glucose-Capillary 138 (H) 65 - 99 mg/dL   Comment 1 Notify RN    Comment 2 Document in Chart   CBC     Status: Abnormal   Collection Time: 07/22/15  6:12 AM  Result Value Ref Range   WBC 7.2 4.0 - 10.5 K/uL   RBC 2.60 (L) 4.22 - 5.81 MIL/uL   Hemoglobin 8.3 (L) 13.0 - 17.0 g/dL   HCT 25.6 (L) 39.0 - 52.0 %   MCV 98.5 78.0 - 100.0 fL   MCH 31.9 26.0 - 34.0 pg   MCHC 32.4 30.0 - 36.0 g/dL   RDW 17.3 (H) 11.5 - 15.5 %   Platelets 175 150 - 400 K/uL  Basic metabolic panel     Status: Abnormal   Collection Time: 07/22/15  6:12 AM  Result Value Ref Range   Sodium 143 135 - 145 mmol/L   Potassium 4.3 3.5 - 5.1 mmol/L   Chloride 111  101 - 111 mmol/L   CO2 26 22 - 32 mmol/L   Glucose, Bld 121 (H) 65 - 99 mg/dL   BUN 32 (H) 6 - 20 mg/dL   Creatinine, Ser 3.04 (H) 0.61 - 1.24 mg/dL   Calcium 8.4 (L) 8.9 - 10.3 mg/dL   GFR calc non Af Amer 20 (L) >60 mL/min   GFR calc Af Amer 23 (L) >60 mL/min    Comment: (NOTE) The eGFR has been calculated using the CKD EPI equation. This calculation has not been validated in all clinical situations. eGFR's persistently <60 mL/min  signify possible Chronic Kidney Disease.    Anion gap 6 5 - 15    ABGS No results for input(s): PHART, PO2ART, TCO2, HCO3 in the last 72 hours.  Invalid input(s): PCO2 CULTURES Recent Results (from the past 240 hour(s))  Blood Culture (routine x 2)     Status: None (Preliminary result)   Collection Time: 07/20/15  6:22 PM  Result Value Ref Range Status   Specimen Description BLOOD RIGHT ARM  Final   Special Requests   Final    BOTTLES DRAWN AEROBIC AND ANAEROBIC AEB 8CC ANA [place]   Culture NO GROWTH 2 DAYS  Final   Report Status PENDING  Incomplete  Blood Culture (routine x 2)     Status: None (Preliminary result)   Collection Time: 07/20/15  6:25 PM  Result Value Ref Range Status   Specimen Description BLOOD RIGHT HAND  Final   Special Requests BOTTLES DRAWN AEROBIC AND ANAEROBIC 3CC EACH  Final   Culture NO GROWTH 2 DAYS  Final   Report Status PENDING  Incomplete  Urine culture     Status: None (Preliminary result)   Collection Time: 07/20/15  6:50 PM  Result Value Ref Range Status   Specimen Description URINE, RANDOM  Final   Special Requests NONE  Final   Culture   Final    >=100,000 COLONIES/mL GRAM NEGATIVE RODS Performed at [facility]    Report Status PENDING  Incomplete  MRSA PCR Screening     Status: None   Collection Time: 07/21/15  2:30 AM  Result Value Ref Range Status   MRSA by PCR NEGATIVE NEGATIVE Final    Comment:        The GeneXpert MRSA Assay (FDA approved for NASAL specimens only), is one component of  a comprehensive MRSA colonization surveillance program. It is not intended to diagnose MRSA infection nor to guide or monitor treatment for MRSA infections.    Studies/Results: No results found.  Medications: I have reviewed the patient's current medications.  Assesment:  Active Problems:   DM (diabetes mellitus), type 2, uncontrolled   Mental retardation   Renal insufficiency   Anemia, chronic disease   Urinary retention   UTI (lower urinary tract infection)    Plan:  Medications reviewed Continue iv antibiotics Will follow sensitivity result Continue Iv hydratrion    LOS: 2 days   Corinthian Kemler 07/22/2015, 10:23 AM

## 2015-07-23 DIAGNOSIS — N184 Chronic kidney disease, stage 4 (severe): Secondary | ICD-10-CM | POA: Diagnosis not present

## 2015-07-23 DIAGNOSIS — I10 Essential (primary) hypertension: Secondary | ICD-10-CM | POA: Diagnosis not present

## 2015-07-23 DIAGNOSIS — N390 Urinary tract infection, site not specified: Secondary | ICD-10-CM | POA: Diagnosis not present

## 2015-07-23 DIAGNOSIS — N139 Obstructive and reflux uropathy, unspecified: Secondary | ICD-10-CM | POA: Diagnosis not present

## 2015-07-23 DIAGNOSIS — D638 Anemia in other chronic diseases classified elsewhere: Secondary | ICD-10-CM | POA: Diagnosis not present

## 2015-07-23 LAB — BASIC METABOLIC PANEL
Anion gap: 7 (ref 5–15)
BUN: 28 mg/dL — ABNORMAL HIGH (ref 6–20)
CO2: 23 mmol/L (ref 22–32)
Calcium: 7.8 mg/dL — ABNORMAL LOW (ref 8.9–10.3)
Chloride: 111 mmol/L (ref 101–111)
Creatinine, Ser: 2.74 mg/dL — ABNORMAL HIGH (ref 0.61–1.24)
GFR calc Af Amer: 26 mL/min — ABNORMAL LOW (ref >60)
GFR calc non Af Amer: 23 mL/min — ABNORMAL LOW (ref >60)
Glucose, Bld: 115 mg/dL — ABNORMAL HIGH (ref 65–99)
Potassium: 4.1 mmol/L (ref 3.5–5.1)
Sodium: 141 mmol/L (ref 135–145)

## 2015-07-23 LAB — GLUCOSE, CAPILLARY
Glucose-Capillary: 91 mg/dL (ref 65–99)
Glucose-Capillary: 131 mg/dL — ABNORMAL HIGH (ref 65–99)

## 2015-07-23 LAB — HEMOGLOBIN A1C
Hgb A1c MFr Bld: 6.5 % — ABNORMAL HIGH (ref 4.8–5.6)
Mean Plasma Glucose: 140 mg/dL

## 2015-07-23 MED ORDER — CIPROFLOXACIN HCL 500 MG PO TABS: 500.0000 mg | ORAL_TABLET | Freq: Two times a day (BID) | ORAL | Status: DC

## 2015-07-23 MED ORDER — CIPROFLOXACIN HCL 500 MG PO TABS: 500.0000 mg | ORAL_TABLET | Freq: Every day | ORAL | Status: DC

## 2015-07-23 NOTE — Clinical Social Work Note (Signed)
Clinical Social Work Assessment  Patient Details  Name: Zachary [name] MRN: [id number] Date of Birth: 1946-10-24  Date of referral:  07/23/15               Reason for consult:  Facility Placement                Permission sought to share information with:  Guardian Permission granted to share information::     Name::     [place]::     Relationship::  guardian  Contact Information:  [phone]  Housing/Transportation Living arrangements for the past 2 months:  [place] of Information:  [place] Patient Interpreter Needed:  None Criminal Activity/Legal Involvement Pertinent to Current Situation/Hospitalization:  No - Comment as needed Significant Relationships:  Siblings Lives with:  Facility Resident Do you feel safe going back to the place where you live?  Yes Need for family participation in patient care:  Yes (Comment)  Care giving concerns:  Pt resident at ALF.    Social Worker assessment / plan:  CSW attempted to meet with pt at bedside, but pt sleeping soundly. Well known to CSW from previous admissions. [name] at [place] became guardian several months ago and pt was placed at that time at [facility]. He has several siblings who are involved. Pt has a suprapubic catheter and home health following. He requires assist with bathing, dressing, and toileting. D/C today. Tammy at [facility] reports pt has appointment with Dr. Lowanda Foster today and they will pick up pt in time to get to this appointment.   Employment status:  Disabled (Comment on whether or not currently receiving Disability) Insurance information:  Medicare PT Recommendations:  Not assessed at this time Information / Referral to community resources:  Other (Comment Required) (return to [facility])  Patient/Family's Response to care:  Guardian agreeable to return to [facility].   Patient/Family's Understanding of and Emotional Response to Diagnosis, Current  Treatment, and Prognosis:  Pt's guardian aware of medical history.   Emotional Assessment Appearance:  Appears stated age Attitude/Demeanor/Rapport:  Unable to Assess Affect (typically observed):  Unable to Assess Orientation:  Oriented to Self, Oriented to Place, Oriented to Situation Alcohol / Substance use:  Not Applicable Psych involvement (Current and /or in the community):  No (Comment)  Discharge Needs  Concerns to be addressed:  Discharge Planning Concerns Readmission within the last 30 days:  Yes Current discharge risk:  Chronically ill Barriers to Discharge:  No Barriers Identified   Salome Arnt, [place] 07/23/2015, 9:10 AM [phone]

## 2015-07-23 NOTE — Care Management Important Message (Signed)
Important Message  Patient Details  Name: MOUAD DRYDEN MRN: [id number] Date of Birth: 29-Mar-1946   Medicare Important Message Given:  Yes-second notification given    Joylene Draft, RN 07/23/2015, 9:05 AM

## 2015-07-23 NOTE — Discharge Summary (Signed)
Physician Discharge Summary  Patient ID: Zachary [name] MRN: [id number] DOB/AGE: 1946/06/04 69 y.o. Primary Care Physician:Warren Lindahl, MD Admit date: 07/20/2015 Discharge date: 07/23/2015    Discharge Diagnoses:   Active Problems:   DM (diabetes mellitus), type 2, uncontrolled   Mental retardation   Renal insufficiency   Anemia, chronic disease   Urinary retention   UTI (lower urinary tract infection)     Medication List    TAKE these medications        carvedilol 6.25 MG tablet  Commonly known as:  COREG  Take 1 tablet (6.25 mg total) by mouth 2 (two) times daily with a meal.     ciprofloxacin 500 MG tablet  Commonly known as:  CIPRO  Take 1 tablet (500 mg total) by mouth 2 (two) times daily.     ferrous sulfate 325 (65 FE) MG tablet  Take 325 mg by mouth daily with breakfast.     HYDROcodone-acetaminophen 5-325 MG per tablet  Commonly known as:  NORCO/VICODIN  Take 1 tablet by mouth every 6 (six) hours as needed for moderate pain.     isosorbide-hydrALAZINE 20-37.5 MG per tablet  Commonly known as:  BIDIL  Take 1 tablet by mouth 3 (three) times daily.     levothyroxine 75 MCG tablet  Commonly known as:  SYNTHROID, LEVOTHROID  Take 75 mcg by mouth daily before breakfast.     linagliptin 5 MG Tabs tablet  Commonly known as:  TRADJENTA  Take 5 mg by mouth daily.     niacin 1000 MG CR tablet  Commonly known as:  NIASPAN  Take 1,000 mg by mouth at bedtime.     omeprazole 20 MG capsule  Commonly known as:  PRILOSEC  Take 40 mg by mouth daily.     simvastatin 20 MG tablet  Commonly known as:  ZOCOR  Take 20 mg by mouth at bedtime.     torsemide 20 MG tablet  Commonly known as:  DEMADEX  Take 20 mg by mouth daily. Daily at 8am     Vitamin D (Ergocalciferol) 50000 UNITS Caps capsule  Commonly known as:  DRISDOL  Take 1 capsule (50,000 Units total) by mouth every 7 (seven) days.        Discharged Condition: improved    Consults:  none  Significant Diagnostic Studies: Ct Abdomen Pelvis Wo Contrast  07/08/2015   CLINICAL DATA:  Nausea and vomiting. Symptoms began with recent in urethral stricture dilation. Persistent nausea and vomiting.  EXAM: CT ABDOMEN AND PELVIS WITHOUT CONTRAST  TECHNIQUE: Multidetector CT imaging of the abdomen and pelvis was performed following the standard protocol without IV contrast.  COMPARISON:  10/05/2011.  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 No aggressive osseous lesions. L4-L5 and L5-S1 predominant degenerative disease of the lumbar spine.  Lung Bases: Atelectasis.  Liver: Unenhanced CT was performed per clinician order. Lack of IV contrast limits sensitivity and specificity, especially for evaluation of abdominal/pelvic solid viscera. Grossly normal.  Spleen:  Normal.  Gallbladder:  Normal.  Common bile duct:  Normal.  Pancreas:  Normal.  Adrenal glands:  Normal bilaterally.  Kidneys: Patulous renal collecting systems however there is no hydronephrosis. No calculi. Distal ureters are collapsed.  Stomach: Patulous distal thoracic esophagus. This is a nonspecific finding. Consider followup endoscopy if there are esophageal symptoms.  Small bowel:  Grossly normal.  Colon: Normal appendix. No inflammatory changes or obstruction of colon.  Pelvic Genitourinary: Suprapubic catheter is present. Additionally, there is  a Foley catheter with the balloon inflated at the bladder dome. There appears to be thickening of the bladder wall for the degree of distension, which is probably chronic.  Peritoneum: Normal.  No free air or free fluid.  Vascular/lymphatic: Atherosclerosis. Low attenuation of the intravascular compartment suggesting anemia. Coronary artery atherosclerosis is present. If office based assessment of coronary risk factors has not been performed, it is now recommended.  Body Wall: Tiny fat containing periumbilical hernia.  IMPRESSION: 1. No acute  abdominal abnormality. 2. Suprapubic and Foley catheter with likely chronic bladder wall thickening. 3. Patulous distal thoracic esophagus.  See discussion above.   Electronically Signed   By: Dereck Ligas M.D.   On: 07/08/2015 19:32    Lab Results: Basic Metabolic Panel:  Recent Labs  07/22/15 0612 07/23/15 0019  NA 143 141  K 4.3 4.1  CL 111 111  CO2 26 23  GLUCOSE 121* 115*  BUN 32* 28*  CREATININE 3.04* 2.74*  CALCIUM 8.4* 7.8*   Liver Function Tests:  Recent Labs  07/20/15 1821 07/21/15 0504  AST 18 14*  ALT 14* 13*  ALKPHOS 43 38  BILITOT 0.4 0.5  PROT 6.6 5.4*  ALBUMIN 3.2* 2.5*     CBC:  Recent Labs  07/20/15 1821 07/21/15 0504 07/22/15 0612  WBC 5.8 9.1 7.2  NEUTROABS 3.6  --   --   HGB 8.5* 8.2* 8.3*  HCT 25.7* 24.5* 25.6*  MCV 97.3 98.0 98.5  PLT 172 165 175    Recent Results (from the past 240 hour(s))  Blood Culture (routine x 2)     Status: None (Preliminary result)   Collection Time: 07/20/15  6:22 PM  Result Value Ref Range Status   Specimen Description BLOOD RIGHT ARM  Final   Special Requests   Final    BOTTLES DRAWN AEROBIC AND ANAEROBIC AEB 8CC ANA [place]   Culture NO GROWTH 2 DAYS  Final   Report Status PENDING  Incomplete  Blood Culture (routine x 2)     Status: None (Preliminary result)   Collection Time: 07/20/15  6:25 PM  Result Value Ref Range Status   Specimen Description BLOOD RIGHT HAND  Final   Special Requests BOTTLES DRAWN AEROBIC AND ANAEROBIC 3CC EACH  Final   Culture NO GROWTH 2 DAYS  Final   Report Status PENDING  Incomplete  Urine culture     Status: None   Collection Time: 07/20/15  6:50 PM  Result Value Ref Range Status   Specimen Description URINE, RANDOM  Final   Special Requests NONE  Final   Culture   Final    >=100,000 COLONIES/mL ENTEROBACTER AEROGENES Performed at [facility]    Report Status 07/22/2015 FINAL  Final   Organism ID, Bacteria ENTEROBACTER AEROGENES  Final      Susceptibility    Enterobacter aerogenes - MIC*    CEFAZOLIN >=64 RESISTANT Resistant     CEFTRIAXONE 16 INTERMEDIATE Intermediate     CIPROFLOXACIN <=0.25 SENSITIVE Sensitive     GENTAMICIN <=1 SENSITIVE Sensitive     IMIPENEM 0.5 SENSITIVE Sensitive     NITROFURANTOIN 64 INTERMEDIATE Intermediate     TRIMETH/SULFA <=20 SENSITIVE Sensitive     PIP/TAZO >=128 RESISTANT Resistant     * >=100,000 COLONIES/mL ENTEROBACTER AEROGENES  MRSA PCR Screening     Status: None   Collection Time: 07/21/15  2:30 AM  Result Value Ref Range Status   MRSA by PCR NEGATIVE NEGATIVE Final    Comment:  The GeneXpert MRSA Assay (FDA approved for NASAL specimens only), is one component of a comprehensive MRSA colonization surveillance program. It is not intended to diagnose MRSA infection nor to guide or monitor treatment for MRSA infections.      Hospital Course:   This is a 69 years male with history of multiple medical illness was admitted due to fever and chills. His urinal;ysis was abnormal. He was started on iv fluid and iv antibiotics. His blood culture grew enterobacter aerogenes. Patient improved and being discharged on oral antibiotics.   Discharge Exam: Blood pressure 145/74, pulse 95, temperature 99.4 F (37.4 C), temperature source Oral, resp. rate 20, height 5\' 7"  (1.702 m), weight 76.658 kg (169 lb), SpO2 99 %.   Disposition:  Assisted living        Follow-up Information    Follow up with [facility], MD In 1 week.   Specialty:  Internal Medicine   Contact information:   [place] [address] [phone]       Signed: [name]   07/23/2015, 8:06 AM

## 2015-07-23 NOTE — Discharge Planning (Signed)
Pt IV and tele removed.  DC papers given, explained and educated.  Told of suggested FU appts. Given script.  VSS and RN assessment revealed stability for DC back to Assisted facility. Pt will be wheeled to front when ride arrives and Facility will be transporting back via [place].

## 2015-07-24 DIAGNOSIS — E1165 Type 2 diabetes mellitus with hyperglycemia: Secondary | ICD-10-CM | POA: Diagnosis not present

## 2015-07-24 DIAGNOSIS — I130 Hypertensive heart and chronic kidney disease with heart failure and stage 1 through stage 4 chronic kidney disease, or unspecified chronic kidney disease: Secondary | ICD-10-CM | POA: Diagnosis not present

## 2015-07-24 DIAGNOSIS — Z466 Encounter for fitting and adjustment of urinary device: Secondary | ICD-10-CM | POA: Diagnosis not present

## 2015-07-24 DIAGNOSIS — E1122 Type 2 diabetes mellitus with diabetic chronic kidney disease: Secondary | ICD-10-CM | POA: Diagnosis not present

## 2015-07-24 DIAGNOSIS — N390 Urinary tract infection, site not specified: Secondary | ICD-10-CM | POA: Diagnosis not present

## 2015-07-24 DIAGNOSIS — N184 Chronic kidney disease, stage 4 (severe): Secondary | ICD-10-CM | POA: Diagnosis not present

## 2015-07-24 DIAGNOSIS — D649 Anemia, unspecified: Secondary | ICD-10-CM | POA: Diagnosis not present

## 2015-07-24 DIAGNOSIS — I129 Hypertensive chronic kidney disease with stage 1 through stage 4 chronic kidney disease, or unspecified chronic kidney disease: Secondary | ICD-10-CM | POA: Diagnosis not present

## 2015-07-25 ENCOUNTER — Encounter: Payer: Medicare Other | Admitting: Vascular Surgery

## 2015-07-25 LAB — CULTURE, BLOOD (ROUTINE X 2)
Culture: NO GROWTH
Culture: NO GROWTH

## 2015-07-26 DIAGNOSIS — N390 Urinary tract infection, site not specified: Secondary | ICD-10-CM | POA: Diagnosis not present

## 2015-07-26 DIAGNOSIS — N139 Obstructive and reflux uropathy, unspecified: Secondary | ICD-10-CM | POA: Diagnosis not present

## 2015-07-26 DIAGNOSIS — N184 Chronic kidney disease, stage 4 (severe): Secondary | ICD-10-CM | POA: Diagnosis not present

## 2015-07-26 DIAGNOSIS — E1165 Type 2 diabetes mellitus with hyperglycemia: Secondary | ICD-10-CM | POA: Diagnosis not present

## 2015-07-27 DIAGNOSIS — D649 Anemia, unspecified: Secondary | ICD-10-CM | POA: Diagnosis not present

## 2015-07-27 DIAGNOSIS — E1165 Type 2 diabetes mellitus with hyperglycemia: Secondary | ICD-10-CM | POA: Diagnosis not present

## 2015-07-27 DIAGNOSIS — Z466 Encounter for fitting and adjustment of urinary device: Secondary | ICD-10-CM | POA: Diagnosis not present

## 2015-07-27 DIAGNOSIS — E1122 Type 2 diabetes mellitus with diabetic chronic kidney disease: Secondary | ICD-10-CM | POA: Diagnosis not present

## 2015-07-27 DIAGNOSIS — I129 Hypertensive chronic kidney disease with stage 1 through stage 4 chronic kidney disease, or unspecified chronic kidney disease: Secondary | ICD-10-CM | POA: Diagnosis not present

## 2015-07-27 DIAGNOSIS — N184 Chronic kidney disease, stage 4 (severe): Secondary | ICD-10-CM | POA: Diagnosis not present

## 2015-07-30 DIAGNOSIS — I129 Hypertensive chronic kidney disease with stage 1 through stage 4 chronic kidney disease, or unspecified chronic kidney disease: Secondary | ICD-10-CM | POA: Diagnosis not present

## 2015-07-30 DIAGNOSIS — E039 Hypothyroidism, unspecified: Secondary | ICD-10-CM | POA: Diagnosis not present

## 2015-07-30 DIAGNOSIS — N184 Chronic kidney disease, stage 4 (severe): Secondary | ICD-10-CM | POA: Diagnosis not present

## 2015-07-30 DIAGNOSIS — F79 Unspecified intellectual disabilities: Secondary | ICD-10-CM | POA: Diagnosis not present

## 2015-07-30 DIAGNOSIS — D649 Anemia, unspecified: Secondary | ICD-10-CM | POA: Diagnosis not present

## 2015-07-30 DIAGNOSIS — E1122 Type 2 diabetes mellitus with diabetic chronic kidney disease: Secondary | ICD-10-CM | POA: Diagnosis not present

## 2015-07-30 DIAGNOSIS — Z466 Encounter for fitting and adjustment of urinary device: Secondary | ICD-10-CM | POA: Diagnosis not present

## 2015-07-30 DIAGNOSIS — E1165 Type 2 diabetes mellitus with hyperglycemia: Secondary | ICD-10-CM | POA: Diagnosis not present

## 2015-07-31 DIAGNOSIS — E1165 Type 2 diabetes mellitus with hyperglycemia: Secondary | ICD-10-CM | POA: Diagnosis not present

## 2015-07-31 DIAGNOSIS — Z466 Encounter for fitting and adjustment of urinary device: Secondary | ICD-10-CM | POA: Diagnosis not present

## 2015-07-31 DIAGNOSIS — D649 Anemia, unspecified: Secondary | ICD-10-CM | POA: Diagnosis not present

## 2015-07-31 DIAGNOSIS — I129 Hypertensive chronic kidney disease with stage 1 through stage 4 chronic kidney disease, or unspecified chronic kidney disease: Secondary | ICD-10-CM | POA: Diagnosis not present

## 2015-07-31 DIAGNOSIS — E1122 Type 2 diabetes mellitus with diabetic chronic kidney disease: Secondary | ICD-10-CM | POA: Diagnosis not present

## 2015-07-31 DIAGNOSIS — N184 Chronic kidney disease, stage 4 (severe): Secondary | ICD-10-CM | POA: Diagnosis not present

## 2015-08-03 DIAGNOSIS — E1122 Type 2 diabetes mellitus with diabetic chronic kidney disease: Secondary | ICD-10-CM | POA: Diagnosis not present

## 2015-08-03 DIAGNOSIS — D649 Anemia, unspecified: Secondary | ICD-10-CM | POA: Diagnosis not present

## 2015-08-03 DIAGNOSIS — E1165 Type 2 diabetes mellitus with hyperglycemia: Secondary | ICD-10-CM | POA: Diagnosis not present

## 2015-08-03 DIAGNOSIS — N184 Chronic kidney disease, stage 4 (severe): Secondary | ICD-10-CM | POA: Diagnosis not present

## 2015-08-03 DIAGNOSIS — I129 Hypertensive chronic kidney disease with stage 1 through stage 4 chronic kidney disease, or unspecified chronic kidney disease: Secondary | ICD-10-CM | POA: Diagnosis not present

## 2015-08-03 DIAGNOSIS — Z466 Encounter for fitting and adjustment of urinary device: Secondary | ICD-10-CM | POA: Diagnosis not present

## 2015-08-06 DIAGNOSIS — M79675 Pain in left toe(s): Secondary | ICD-10-CM | POA: Diagnosis not present

## 2015-08-06 DIAGNOSIS — B351 Tinea unguium: Secondary | ICD-10-CM | POA: Diagnosis not present

## 2015-08-06 DIAGNOSIS — M79674 Pain in right toe(s): Secondary | ICD-10-CM | POA: Diagnosis not present

## 2015-08-07 ENCOUNTER — Encounter: Payer: Self-pay | Admitting: Vascular Surgery

## 2015-08-07 DIAGNOSIS — Z466 Encounter for fitting and adjustment of urinary device: Secondary | ICD-10-CM | POA: Diagnosis not present

## 2015-08-07 DIAGNOSIS — D649 Anemia, unspecified: Secondary | ICD-10-CM | POA: Diagnosis not present

## 2015-08-07 DIAGNOSIS — I129 Hypertensive chronic kidney disease with stage 1 through stage 4 chronic kidney disease, or unspecified chronic kidney disease: Secondary | ICD-10-CM | POA: Diagnosis not present

## 2015-08-07 DIAGNOSIS — N184 Chronic kidney disease, stage 4 (severe): Secondary | ICD-10-CM | POA: Diagnosis not present

## 2015-08-07 DIAGNOSIS — E1165 Type 2 diabetes mellitus with hyperglycemia: Secondary | ICD-10-CM | POA: Diagnosis not present

## 2015-08-07 DIAGNOSIS — E1122 Type 2 diabetes mellitus with diabetic chronic kidney disease: Secondary | ICD-10-CM | POA: Diagnosis not present

## 2015-08-08 ENCOUNTER — Ambulatory Visit (INDEPENDENT_AMBULATORY_CARE_PROVIDER_SITE_OTHER): Payer: Self-pay | Admitting: Vascular Surgery

## 2015-08-08 ENCOUNTER — Encounter: Payer: Self-pay | Admitting: Vascular Surgery

## 2015-08-08 VITALS — BP 137/83 | HR 91 | Temp 98.1°F | Resp 18 | Ht 67.0 in | Wt 203.0 lb

## 2015-08-08 DIAGNOSIS — N184 Chronic kidney disease, stage 4 (severe): Secondary | ICD-10-CM

## 2015-08-08 NOTE — Progress Notes (Signed)
Patient name: Zachary [name] MRN: [id number] DOB: [birth year] Sex: male  REASON FOR VISIT: follow up after new left upper arm AV graft  HPI: Zachary [name] is a 69 y.o. male who had a new left upper arm AV graft placed with a 4-7 mm PTFE graft on 07/06/2015. He comes in for a follow up visit. He has no specific complaints except he has had some left arm swelling. He is not yet on dialysis.  Current Outpatient Prescriptions  Medication Sig Dispense Refill  . carvedilol (COREG) 6.25 MG tablet Take 1 tablet (6.25 mg total) by mouth 2 (two) times daily with a meal. 60 tablet 3  . ciprofloxacin (CIPRO) 500 MG tablet Take 1 tablet (500 mg total) by mouth daily. 5 tablet 0  . ferrous sulfate 325 (65 FE) MG tablet Take 325 mg by mouth daily with breakfast.    . HYDROcodone-acetaminophen (NORCO/VICODIN) 5-325 MG per tablet Take 1 tablet by mouth every 6 (six) hours as needed for moderate pain. 30 tablet 0  . isosorbide-hydrALAZINE (BIDIL) 20-37.5 MG per tablet Take 1 tablet by mouth 3 (three) times daily. (Patient taking differently: Take 1 tablet by mouth 3 (three) times daily. 8am,2pm,8pm) 90 tablet 3  . levothyroxine (SYNTHROID, LEVOTHROID) 75 MCG tablet Take 75 mcg by mouth daily before breakfast.     . linagliptin (TRADJENTA) 5 MG TABS tablet Take 5 mg by mouth daily.    . niacin (NIASPAN) 1000 MG CR tablet Take 1,000 mg by mouth at bedtime.    Marland Kitchen omeprazole (PRILOSEC) 20 MG capsule Take 40 mg by mouth daily.    . simvastatin (ZOCOR) 20 MG tablet Take 20 mg by mouth at bedtime.     . sodium polystyrene (KAYEXALATE) 15 GM/60ML suspension Take 30 g by mouth. Daily on Monday and Friday    . torsemide (DEMADEX) 20 MG tablet Take 20 mg by mouth daily. Daily at 8am    . Vitamin D, Ergocalciferol, (DRISDOL) 50000 UNITS CAPS capsule Take 1 capsule (50,000 Units total) by mouth every 7 (seven) days. (Patient taking differently: Take 50,000 Units by mouth every 7 (seven) days. On Monday) 30 capsule 3   No  current facility-administered medications for this visit.   REVIEW OF SYSTEMS: Valu.Nieves ] denotes positive finding; [  ] denotes negative finding  CARDIOVASCULAR:  [ ]  chest pain   [ ]  dyspnea on exertion    CONSTITUTIONAL:  [ ]  fever   [ ]  chills  PHYSICAL EXAM: Filed Vitals:   08/08/15 1100 08/08/15 1103  BP: 144/80 137/83  Pulse: 91   Temp: 98.1 F (36.7 C)   TempSrc: Oral   Resp: 18   Height: 5\' 7"  (1.702 m)   Weight: 203 lb (92.08 kg)   SpO2: 100%    GENERAL: The patient is a well-nourished male, in no acute distress. The vital signs are documented above. CARDIOVASCULAR: There is a regular rate and rhythm. PULMONARY: There is good air exchange bilaterally without wheezing or rales. His left upper arm graft has an excellent thrill and bruit. His incisions are healing nicely. He has moderate left upper extremity swelling. His left hand is warm and well-perfused.  MEDICAL ISSUES:  STATUS POST LEFT UPPER ARM AV GRAFT: This graft is working well although he does have moderate left upper extremity swelling. I have written instructions for him to elevate his arm at the nursing facility. If this does not improve or worsens he may ultimately require a fistulogram to look for central  venous stenosis which might be amenable to venoplasty. I will see him back as needed.  [name] [facility] [place] [address]: [phone]

## 2015-08-15 DIAGNOSIS — D649 Anemia, unspecified: Secondary | ICD-10-CM | POA: Diagnosis not present

## 2015-08-15 DIAGNOSIS — Z466 Encounter for fitting and adjustment of urinary device: Secondary | ICD-10-CM | POA: Diagnosis not present

## 2015-08-15 DIAGNOSIS — E1165 Type 2 diabetes mellitus with hyperglycemia: Secondary | ICD-10-CM | POA: Diagnosis not present

## 2015-08-15 DIAGNOSIS — N359 Urethral stricture, unspecified: Secondary | ICD-10-CM | POA: Diagnosis not present

## 2015-08-15 DIAGNOSIS — R3129 Other microscopic hematuria: Secondary | ICD-10-CM | POA: Diagnosis not present

## 2015-08-15 DIAGNOSIS — E1122 Type 2 diabetes mellitus with diabetic chronic kidney disease: Secondary | ICD-10-CM | POA: Diagnosis not present

## 2015-08-15 DIAGNOSIS — N184 Chronic kidney disease, stage 4 (severe): Secondary | ICD-10-CM | POA: Diagnosis not present

## 2015-08-15 DIAGNOSIS — I129 Hypertensive chronic kidney disease with stage 1 through stage 4 chronic kidney disease, or unspecified chronic kidney disease: Secondary | ICD-10-CM | POA: Diagnosis not present

## 2015-08-16 DIAGNOSIS — H25813 Combined forms of age-related cataract, bilateral: Secondary | ICD-10-CM | POA: Diagnosis not present

## 2015-08-20 DIAGNOSIS — Z466 Encounter for fitting and adjustment of urinary device: Secondary | ICD-10-CM | POA: Diagnosis not present

## 2015-08-20 DIAGNOSIS — N184 Chronic kidney disease, stage 4 (severe): Secondary | ICD-10-CM | POA: Diagnosis not present

## 2015-08-20 DIAGNOSIS — E1165 Type 2 diabetes mellitus with hyperglycemia: Secondary | ICD-10-CM | POA: Diagnosis not present

## 2015-08-20 DIAGNOSIS — D649 Anemia, unspecified: Secondary | ICD-10-CM | POA: Diagnosis not present

## 2015-08-20 DIAGNOSIS — I129 Hypertensive chronic kidney disease with stage 1 through stage 4 chronic kidney disease, or unspecified chronic kidney disease: Secondary | ICD-10-CM | POA: Diagnosis not present

## 2015-08-20 DIAGNOSIS — E1122 Type 2 diabetes mellitus with diabetic chronic kidney disease: Secondary | ICD-10-CM | POA: Diagnosis not present

## 2015-08-22 DIAGNOSIS — N390 Urinary tract infection, site not specified: Secondary | ICD-10-CM | POA: Diagnosis not present

## 2015-08-22 DIAGNOSIS — R338 Other retention of urine: Secondary | ICD-10-CM | POA: Diagnosis not present

## 2015-08-29 DIAGNOSIS — E1165 Type 2 diabetes mellitus with hyperglycemia: Secondary | ICD-10-CM | POA: Diagnosis not present

## 2015-08-29 DIAGNOSIS — D649 Anemia, unspecified: Secondary | ICD-10-CM | POA: Diagnosis not present

## 2015-08-29 DIAGNOSIS — N184 Chronic kidney disease, stage 4 (severe): Secondary | ICD-10-CM | POA: Diagnosis not present

## 2015-08-29 DIAGNOSIS — I129 Hypertensive chronic kidney disease with stage 1 through stage 4 chronic kidney disease, or unspecified chronic kidney disease: Secondary | ICD-10-CM | POA: Diagnosis not present

## 2015-08-29 DIAGNOSIS — E1122 Type 2 diabetes mellitus with diabetic chronic kidney disease: Secondary | ICD-10-CM | POA: Diagnosis not present

## 2015-08-29 DIAGNOSIS — Z466 Encounter for fitting and adjustment of urinary device: Secondary | ICD-10-CM | POA: Diagnosis not present

## 2015-09-04 DIAGNOSIS — D649 Anemia, unspecified: Secondary | ICD-10-CM | POA: Diagnosis not present

## 2015-09-04 DIAGNOSIS — E1122 Type 2 diabetes mellitus with diabetic chronic kidney disease: Secondary | ICD-10-CM | POA: Diagnosis not present

## 2015-09-04 DIAGNOSIS — E1165 Type 2 diabetes mellitus with hyperglycemia: Secondary | ICD-10-CM | POA: Diagnosis not present

## 2015-09-04 DIAGNOSIS — Z466 Encounter for fitting and adjustment of urinary device: Secondary | ICD-10-CM | POA: Diagnosis not present

## 2015-09-04 DIAGNOSIS — I129 Hypertensive chronic kidney disease with stage 1 through stage 4 chronic kidney disease, or unspecified chronic kidney disease: Secondary | ICD-10-CM | POA: Diagnosis not present

## 2015-09-04 DIAGNOSIS — N184 Chronic kidney disease, stage 4 (severe): Secondary | ICD-10-CM | POA: Diagnosis not present

## 2015-09-06 DIAGNOSIS — N390 Urinary tract infection, site not specified: Secondary | ICD-10-CM | POA: Diagnosis not present

## 2015-09-06 DIAGNOSIS — N320 Bladder-neck obstruction: Secondary | ICD-10-CM | POA: Diagnosis not present

## 2015-09-06 DIAGNOSIS — B9689 Other specified bacterial agents as the cause of diseases classified elsewhere: Secondary | ICD-10-CM | POA: Diagnosis not present

## 2015-09-10 DIAGNOSIS — F79 Unspecified intellectual disabilities: Secondary | ICD-10-CM | POA: Diagnosis not present

## 2015-09-10 DIAGNOSIS — N184 Chronic kidney disease, stage 4 (severe): Secondary | ICD-10-CM | POA: Diagnosis not present

## 2015-09-10 DIAGNOSIS — E039 Hypothyroidism, unspecified: Secondary | ICD-10-CM | POA: Diagnosis not present

## 2015-09-10 DIAGNOSIS — E1165 Type 2 diabetes mellitus with hyperglycemia: Secondary | ICD-10-CM | POA: Diagnosis not present

## 2015-09-11 DIAGNOSIS — D631 Anemia in chronic kidney disease: Secondary | ICD-10-CM | POA: Diagnosis not present

## 2015-09-11 DIAGNOSIS — Z79899 Other long term (current) drug therapy: Secondary | ICD-10-CM | POA: Diagnosis not present

## 2015-09-11 DIAGNOSIS — I10 Essential (primary) hypertension: Secondary | ICD-10-CM | POA: Diagnosis not present

## 2015-09-11 DIAGNOSIS — E559 Vitamin D deficiency, unspecified: Secondary | ICD-10-CM | POA: Diagnosis not present

## 2015-09-11 DIAGNOSIS — N184 Chronic kidney disease, stage 4 (severe): Secondary | ICD-10-CM | POA: Diagnosis not present

## 2015-09-11 DIAGNOSIS — D509 Iron deficiency anemia, unspecified: Secondary | ICD-10-CM | POA: Diagnosis not present

## 2015-09-11 DIAGNOSIS — E875 Hyperkalemia: Secondary | ICD-10-CM | POA: Diagnosis not present

## 2015-09-13 ENCOUNTER — Emergency Department (HOSPITAL_COMMUNITY): Payer: Medicare Other

## 2015-09-13 ENCOUNTER — Encounter (HOSPITAL_COMMUNITY): Payer: Self-pay

## 2015-09-13 ENCOUNTER — Inpatient Hospital Stay (HOSPITAL_COMMUNITY)
Admission: EM | Admit: 2015-09-13 | Discharge: 2015-09-18 | DRG: 690 | Payer: Medicare Other | Attending: Internal Medicine | Admitting: Internal Medicine

## 2015-09-13 DIAGNOSIS — F79 Unspecified intellectual disabilities: Secondary | ICD-10-CM

## 2015-09-13 DIAGNOSIS — N12 Tubulo-interstitial nephritis, not specified as acute or chronic: Secondary | ICD-10-CM | POA: Diagnosis not present

## 2015-09-13 DIAGNOSIS — R1110 Vomiting, unspecified: Secondary | ICD-10-CM | POA: Diagnosis not present

## 2015-09-13 DIAGNOSIS — E1165 Type 2 diabetes mellitus with hyperglycemia: Secondary | ICD-10-CM | POA: Diagnosis not present

## 2015-09-13 DIAGNOSIS — N181 Chronic kidney disease, stage 1: Secondary | ICD-10-CM | POA: Diagnosis not present

## 2015-09-13 DIAGNOSIS — N390 Urinary tract infection, site not specified: Secondary | ICD-10-CM | POA: Diagnosis not present

## 2015-09-13 DIAGNOSIS — Z794 Long term (current) use of insulin: Secondary | ICD-10-CM | POA: Diagnosis not present

## 2015-09-13 DIAGNOSIS — N184 Chronic kidney disease, stage 4 (severe): Secondary | ICD-10-CM

## 2015-09-13 DIAGNOSIS — N179 Acute kidney failure, unspecified: Secondary | ICD-10-CM

## 2015-09-13 DIAGNOSIS — N1330 Unspecified hydronephrosis: Secondary | ICD-10-CM | POA: Diagnosis not present

## 2015-09-13 DIAGNOSIS — N2889 Other specified disorders of kidney and ureter: Secondary | ICD-10-CM | POA: Diagnosis present

## 2015-09-13 DIAGNOSIS — R112 Nausea with vomiting, unspecified: Secondary | ICD-10-CM | POA: Diagnosis not present

## 2015-09-13 DIAGNOSIS — D638 Anemia in other chronic diseases classified elsewhere: Secondary | ICD-10-CM | POA: Diagnosis present

## 2015-09-13 DIAGNOSIS — N10 Acute tubulo-interstitial nephritis: Secondary | ICD-10-CM | POA: Diagnosis not present

## 2015-09-13 DIAGNOSIS — E1122 Type 2 diabetes mellitus with diabetic chronic kidney disease: Secondary | ICD-10-CM | POA: Diagnosis present

## 2015-09-13 DIAGNOSIS — I10 Essential (primary) hypertension: Secondary | ICD-10-CM

## 2015-09-13 DIAGNOSIS — B9789 Other viral agents as the cause of diseases classified elsewhere: Secondary | ICD-10-CM | POA: Diagnosis present

## 2015-09-13 DIAGNOSIS — R110 Nausea: Secondary | ICD-10-CM | POA: Diagnosis not present

## 2015-09-13 DIAGNOSIS — F72 Severe intellectual disabilities: Secondary | ICD-10-CM | POA: Diagnosis not present

## 2015-09-13 DIAGNOSIS — D649 Anemia, unspecified: Secondary | ICD-10-CM | POA: Diagnosis not present

## 2015-09-13 DIAGNOSIS — I129 Hypertensive chronic kidney disease with stage 1 through stage 4 chronic kidney disease, or unspecified chronic kidney disease: Secondary | ICD-10-CM | POA: Diagnosis present

## 2015-09-13 LAB — COMPREHENSIVE METABOLIC PANEL
ALT: 12 U/L — ABNORMAL LOW (ref 17–63)
AST: 18 U/L (ref 15–41)
Albumin: 3.4 g/dL — ABNORMAL LOW (ref 3.5–5.0)
Alkaline Phosphatase: 47 U/L (ref 38–126)
Anion gap: 9 (ref 5–15)
BUN: 56 mg/dL — ABNORMAL HIGH (ref 6–20)
CO2: 24 mmol/L (ref 22–32)
Calcium: 9.2 mg/dL (ref 8.9–10.3)
Chloride: 111 mmol/L (ref 101–111)
Creatinine, Ser: 4.76 mg/dL — ABNORMAL HIGH (ref 0.61–1.24)
GFR calc Af Amer: 13 mL/min — ABNORMAL LOW (ref >60)
GFR calc non Af Amer: 11 mL/min — ABNORMAL LOW (ref >60)
Glucose, Bld: 150 mg/dL — ABNORMAL HIGH (ref 65–99)
Potassium: 4.5 mmol/L (ref 3.5–5.1)
Sodium: 144 mmol/L (ref 135–145)
Total Bilirubin: 0.6 mg/dL (ref 0.3–1.2)
Total Protein: 6.8 g/dL (ref 6.5–8.1)

## 2015-09-13 LAB — CBC WITH DIFFERENTIAL/PLATELET
Basophils Absolute: 0 10*3/uL (ref 0.0–0.1)
Basophils Relative: 0 %
Eosinophils Absolute: 0 10*3/uL (ref 0.0–0.7)
Eosinophils Relative: 0 %
HCT: 26.1 % — ABNORMAL LOW (ref 39.0–52.0)
Hemoglobin: 8.2 g/dL — ABNORMAL LOW (ref 13.0–17.0)
Lymphocytes Relative: 7 %
Lymphs Abs: 0.8 10*3/uL (ref 0.7–4.0)
MCH: 33.3 pg (ref 26.0–34.0)
MCHC: 31.4 g/dL (ref 30.0–36.0)
MCV: 106.1 fL — ABNORMAL HIGH (ref 78.0–100.0)
Monocytes Absolute: 1.4 10*3/uL — ABNORMAL HIGH (ref 0.1–1.0)
Monocytes Relative: 11 %
Neutro Abs: 9.8 10*3/uL — ABNORMAL HIGH (ref 1.7–7.7)
Neutrophils Relative %: 82 %
Platelets: 132 10*3/uL — ABNORMAL LOW (ref 150–400)
RBC: 2.46 MIL/uL — ABNORMAL LOW (ref 4.22–5.81)
RDW: 14.8 % (ref 11.5–15.5)
WBC: 12 10*3/uL — ABNORMAL HIGH (ref 4.0–10.5)

## 2015-09-13 LAB — CBG MONITORING, ED: Glucose-Capillary: 134 mg/dL — ABNORMAL HIGH (ref 65–99)

## 2015-09-13 LAB — URINALYSIS, ROUTINE W REFLEX MICROSCOPIC
Bilirubin Urine: NEGATIVE
Glucose, UA: NEGATIVE mg/dL
Ketones, ur: NEGATIVE mg/dL
Nitrite: NEGATIVE
Protein, ur: 100 mg/dL — AB
Specific Gravity, Urine: 1.025 (ref 1.005–1.030)
pH: 6 (ref 5.0–8.0)

## 2015-09-13 LAB — URINE MICROSCOPIC-ADD ON

## 2015-09-13 LAB — TROPONIN I: Troponin I: <0.03 ng/mL (ref <0.031)

## 2015-09-13 LAB — GLUCOSE, CAPILLARY: Glucose-Capillary: 108 mg/dL — ABNORMAL HIGH (ref 65–99)

## 2015-09-13 LAB — LIPASE, BLOOD: Lipase: 34 U/L (ref 11–51)

## 2015-09-13 MED ORDER — ONDANSETRON HCL 4 MG/2ML IJ SOLN: 4.0000 mg | Freq: Three times a day (TID) | INTRAMUSCULAR | Status: DC | PRN

## 2015-09-13 MED ORDER — FERROUS SULFATE 325 (65 FE) MG PO TABS
325.0000 mg | ORAL_TABLET | Freq: Every day | ORAL | Status: DC
  Administered 2015-09-14 – 2015-09-16 (×3): 325 mg via ORAL
  Filled 2015-09-13 (×3): qty 1

## 2015-09-13 MED ORDER — SODIUM CHLORIDE 0.9 % IV SOLN: INTRAVENOUS | Status: DC

## 2015-09-13 MED ORDER — HYDROCODONE-ACETAMINOPHEN 5-325 MG PO TABS: 1.0000 | ORAL_TABLET | Freq: Four times a day (QID) | ORAL | Status: DC | PRN

## 2015-09-13 MED ORDER — NIACIN ER (ANTIHYPERLIPIDEMIC) 500 MG PO TBCR
1000.0000 mg | EXTENDED_RELEASE_TABLET | Freq: Every day | ORAL | Status: DC
  Administered 2015-09-13 – 2015-09-17 (×5): 1000 mg via ORAL
  Filled 2015-09-13 (×6): qty 2

## 2015-09-13 MED ORDER — INSULIN ASPART 100 UNIT/ML ~~LOC~~ SOLN: 0.0000 [IU] | Freq: Every day | SUBCUTANEOUS | Status: DC

## 2015-09-13 MED ORDER — SODIUM CHLORIDE 0.9 % IV SOLN
INTRAVENOUS | Status: DC
  Administered 2015-09-13: 17:00:00 via INTRAVENOUS

## 2015-09-13 MED ORDER — LEVOTHYROXINE SODIUM 75 MCG PO TABS
75.0000 ug | ORAL_TABLET | Freq: Every day | ORAL | Status: DC
  Administered 2015-09-14 – 2015-09-18 (×5): 75 ug via ORAL
  Filled 2015-09-13 (×5): qty 1

## 2015-09-13 MED ORDER — LINAGLIPTIN 5 MG PO TABS
5.0000 mg | ORAL_TABLET | Freq: Every day | ORAL | Status: DC
  Administered 2015-09-14 – 2015-09-18 (×5): 5 mg via ORAL
  Filled 2015-09-13 (×6): qty 1

## 2015-09-13 MED ORDER — ISOSORB DINITRATE-HYDRALAZINE 20-37.5 MG PO TABS
1.0000 | ORAL_TABLET | Freq: Three times a day (TID) | ORAL | Status: DC
  Administered 2015-09-13 – 2015-09-18 (×14): 1 via ORAL
  Filled 2015-09-13 (×17): qty 1

## 2015-09-13 MED ORDER — ONDANSETRON HCL 4 MG/2ML IJ SOLN
4.0000 mg | INTRAMUSCULAR | Status: AC | PRN
  Administered 2015-09-13 (×2): 4 mg via INTRAVENOUS
  Filled 2015-09-13 (×2): qty 2

## 2015-09-13 MED ORDER — ONDANSETRON HCL 4 MG PO TABS: 4.0000 mg | ORAL_TABLET | Freq: Four times a day (QID) | ORAL | Status: DC | PRN

## 2015-09-13 MED ORDER — DEXTROSE 5 % IV SOLN
1.0000 g | INTRAVENOUS | Status: DC
  Administered 2015-09-14 – 2015-09-17 (×4): 1 g via INTRAVENOUS
  Filled 2015-09-13 (×5): qty 10

## 2015-09-13 MED ORDER — ONDANSETRON HCL 4 MG/2ML IJ SOLN
4.0000 mg | Freq: Four times a day (QID) | INTRAMUSCULAR | Status: DC | PRN
  Administered 2015-09-14: 4 mg via INTRAVENOUS
  Filled 2015-09-13 (×2): qty 2

## 2015-09-13 MED ORDER — ISOSORB DINITRATE-HYDRALAZINE 20-37.5 MG PO TABS
ORAL_TABLET | ORAL | Status: AC
  Filled 2015-09-13: qty 1

## 2015-09-13 MED ORDER — CARVEDILOL 3.125 MG PO TABS
6.2500 mg | ORAL_TABLET | Freq: Two times a day (BID) | ORAL | Status: DC
  Administered 2015-09-14 – 2015-09-18 (×9): 6.25 mg via ORAL
  Filled 2015-09-13 (×9): qty 2

## 2015-09-13 MED ORDER — NIACIN 500 MG PO TABS
ORAL_TABLET | ORAL | Status: AC
  Filled 2015-09-13: qty 2

## 2015-09-13 MED ORDER — DEXTROSE 5 % IV SOLN
1.0000 g | Freq: Once | INTRAVENOUS | Status: AC
  Administered 2015-09-13: 1 g via INTRAVENOUS
  Filled 2015-09-13: qty 10

## 2015-09-13 MED ORDER — SODIUM CHLORIDE 0.9 % IV SOLN
INTRAVENOUS | Status: DC
  Administered 2015-09-13 – 2015-09-17 (×5): via INTRAVENOUS

## 2015-09-13 MED ORDER — PANTOPRAZOLE SODIUM 40 MG PO TBEC
40.0000 mg | DELAYED_RELEASE_TABLET | Freq: Every day | ORAL | Status: DC
  Administered 2015-09-13 – 2015-09-18 (×6): 40 mg via ORAL
  Filled 2015-09-13 (×6): qty 1

## 2015-09-13 MED ORDER — TAMSULOSIN HCL 0.4 MG PO CAPS
0.4000 mg | ORAL_CAPSULE | Freq: Every day | ORAL | Status: DC
  Administered 2015-09-13 – 2015-09-18 (×6): 0.4 mg via ORAL
  Filled 2015-09-13 (×6): qty 1

## 2015-09-13 MED ORDER — ENOXAPARIN SODIUM 30 MG/0.3ML ~~LOC~~ SOLN
30.0000 mg | SUBCUTANEOUS | Status: DC
  Administered 2015-09-13 – 2015-09-17 (×5): 30 mg via SUBCUTANEOUS
  Filled 2015-09-13 (×5): qty 0.3

## 2015-09-13 MED ORDER — INSULIN ASPART 100 UNIT/ML ~~LOC~~ SOLN
0.0000 [IU] | Freq: Three times a day (TID) | SUBCUTANEOUS | Status: DC
  Administered 2015-09-15 – 2015-09-16 (×5): 1 [IU] via SUBCUTANEOUS

## 2015-09-13 NOTE — ED Notes (Signed)
Pt is refusing to drink contrast at this time, MD and CT notified. Zofran given and spoke with pt again about drinking contrast.

## 2015-09-13 NOTE — ED Provider Notes (Signed)
CSN: [id number]     Arrival date & time 09/13/15  1347 History   First MD Initiated Contact with Patient 09/13/15 1624     Chief Complaint  Patient presents with  . Emesis      Patient is a 69 y.o. male presenting with vomiting. The history is provided by the patient, a relative and the nursing home. The history is limited by the condition of the patient (Hx MR).  Emesis Pt was seen at 1645. Per pt, pt's friend and [employer] staff:  Pt with multiple episodes of N/V for the past 3 days. Has been associated with vague generalized abd "pain." Denies fevers, no diarrhea, no back pain, no CP/SOB, no cough.   Past Medical History  Diagnosis Date  . Hypertension   . Diabetes mellitus   . Smoking   . Abnormal CT scan, kidney 10/06/2011  . UTI (lower urinary tract infection) 10/06/2011  . Bladder wall thickening 10/06/2011  . 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 Poor historian poor historian  . BPH (benign prostatic hypertrophy)   . Anxiety     mental retardation  . Hypothyroidism   . MR (mental retardation)   . Acute pyelonephritis 10/07/2011  . Hyperkalemia   . Renal insufficiency     chronic history  . Renal failure (ARF), acute on chronic (HCC)   . Anemia     normocytic  . Edema      history of lower extremity edema  . Hyperlipidemia   . Impaired speech    Past Surgical History  Procedure Laterality Date  . Cystoscopy with urethral dilatation N/A 12/29/2013    Procedure: CYSTOSCOPY WITH URETHRAL DILATATION;  Surgeon: Marissa Nestle, MD;  Location: [facility];  Service: Urology;  Laterality: N/A;  . Transurethral resection of prostate N/A 01/04/2014    Procedure: TRANSURETHRAL RESECTION OF THE PROSTATE (TURP) (procedure #2);  Surgeon: Marissa Nestle, MD;  Location: [facility];  Service: Urology;  Laterality: N/A;  . Circumcision N/A 01/04/2014    Procedure: CIRCUMCISION ADULT (procedure #1);  Surgeon: Marissa Nestle, MD;  Location: [facility];  Service: Urology;  Laterality: N/A;  .  Cystoscopy w/ retrogrades Bilateral 06/29/2015    Procedure: CYSTOSCOPY, DILATION OF URETHRAL STRICTURE WITH BILATERAL RETROGRADE PYELOGRAM,SUPRAPUBIC TUBE CHANGE;  Surgeon: Festus Aloe, MD;  Location: [facility];  Service: Urology;  Laterality: Bilateral;  . Av fistula placement Left 07/06/2015    Procedure:  INSERTION LEFT ARM ARTERIOVENOUS GORTEX GRAFT;  Surgeon: Angelia Mould, MD;  Location: [facility] OR;  Service: Vascular;  Laterality: Left;   Family History  Problem Relation Age of Onset  . Cancer Mother    Social History  Substance Use Topics  . Smoking status: Never Smoker   . Smokeless tobacco: Never Used  . Alcohol Use: No     Comment: occ. use     Review of Systems  Unable to perform ROS: Other  Gastrointestinal: Positive for vomiting.      Allergies  Review of patient's allergies indicates no known allergies.  Home Medications   Prior to Admission medications   Medication Sig Start Date End Date Taking? Authorizing Provider  carvedilol (COREG) 6.25 MG tablet Take 1 tablet (6.25 mg total) by mouth 2 (two) times daily with a meal. 03/08/15  Yes [name], MD  ferrous sulfate 325 (65 FE) MG tablet Take 325 mg by mouth daily with breakfast.   Yes Historical Provider, MD  HYDROcodone-acetaminophen (NORCO/VICODIN) 5-325 MG per tablet Take 1 tablet by mouth  every 6 (six) hours as needed for moderate pain. 07/06/15  Yes [name], PA-C  isosorbide-hydrALAZINE (BIDIL) 20-37.5 MG per tablet Take 1 tablet by mouth 3 (three) times daily. Patient taking differently: Take 1 tablet by mouth 3 (three) times daily. 8am,2pm,8pm 03/08/15  Yes [name], MD  levothyroxine (SYNTHROID, LEVOTHROID) 75 MCG tablet Take 75 mcg by mouth daily before breakfast.    Yes Historical Provider, MD  linagliptin (TRADJENTA) 5 MG TABS tablet Take 5 mg by mouth daily.   Yes Historical Provider, MD  niacin (NIASPAN) 1000 MG CR tablet Take 1,000 mg by mouth at bedtime.   Yes Historical Provider,  MD  nitrofurantoin (MACRODANTIN) 100 MG capsule Take 100 mg by mouth 2 (two) times daily.  06/11/15  Yes Historical Provider, MD  omeprazole (PRILOSEC) 20 MG capsule Take 40 mg by mouth daily.   Yes Historical Provider, MD  simvastatin (ZOCOR) 20 MG tablet Take 20 mg by mouth at bedtime.  02/28/15  Yes Historical Provider, MD  sodium polystyrene (KAYEXALATE) 15 GM/60ML suspension Take 30 g by mouth. Daily on Monday and Friday   Yes Historical Provider, MD  tamsulosin (FLOMAX) 0.4 MG CAPS capsule Take 0.4 mg by mouth daily.   Yes Historical Provider, MD  torsemide (DEMADEX) 20 MG tablet Take 20 mg by mouth daily. Daily at 8am 06/13/15  Yes Historical Provider, MD  Vitamin D, Ergocalciferol, (DRISDOL) 50000 UNITS CAPS capsule Take 1 capsule (50,000 Units total) by mouth every 7 (seven) days. Patient taking differently: Take 50,000 Units by mouth every 7 (seven) days. On Monday 03/08/15  Yes [name], MD  ciprofloxacin (CIPRO) 500 MG tablet Take 1 tablet (500 mg total) by mouth daily. Patient not taking: Reported on 09/13/2015 07/23/15   [name], MD   BP 121/72 mmHg  Pulse 106  Temp(Src) 98.9 F (37.2 C) (Oral)  Resp 22  Wt 203 lb (92.08 kg)  SpO2 95% Physical Exam  1650: Physical examination:  Nursing notes reviewed; Vital signs and O2 SAT reviewed;  Constitutional: Well developed, Well nourished, Well hydrated, In no acute distress; Head:  Normocephalic, atraumatic; Eyes: EOMI, PERRL, No scleral icterus; ENMT: Mouth and pharynx normal, Mucous membranes moist; Neck: Supple, Full range of motion, No lymphadenopathy; Cardiovascular: Regular rate and rhythm, No gallop; Respiratory: Breath sounds clear & equal bilaterally, No wheezes.  Speaking full sentences with ease, Normal respiratory effort/excursion; Chest: Nontender, Movement normal; Abdomen: Soft, +mild diffuse tenderness to palp. No rebound or guarding. Nondistended, Normal bowel sounds; Genitourinary: No CVA tenderness; Extremities:  Pulses normal, No tenderness, No edema, No calf edema or asymmetry.; Neuro: Awake, alert. Poor historian.  Major CN grossly intact.  Speech clear. No gross focal motor deficits in extremities.; Skin: Color normal, Warm, Dry.   ED Course  Procedures (including critical care time) Labs Review   Imaging Review  I have personally reviewed and evaluated these images and lab results as part of my medical decision-making.   EKG Interpretation None      MDM  MDM Reviewed: previous chart, nursing note and vitals Reviewed previous: labs and ECG Interpretation: labs, CT scan, x-ray and ECG     Results for orders placed or performed during the hospital encounter of 09/13/15  CBC with Differential  Result Value Ref Range   WBC 12.0 (H) 4.0 - 10.5 K/uL   RBC 2.46 (L) 4.22 - 5.81 MIL/uL   Hemoglobin 8.2 (L) 13.0 - 17.0 g/dL   HCT 26.1 (L) 39.0 - 52.0 %  MCV 106.1 (H) 78.0 - 100.0 fL   MCH 33.3 26.0 - 34.0 pg   MCHC 31.4 30.0 - 36.0 g/dL   RDW 14.8 11.5 - 15.5 %   Platelets 132 (L) 150 - 400 K/uL   Neutrophils Relative % 82 %   Neutro Abs 9.8 (H) 1.7 - 7.7 K/uL   Lymphocytes Relative 7 %   Lymphs Abs 0.8 0.7 - 4.0 K/uL   Monocytes Relative 11 %   Monocytes Absolute 1.4 (H) 0.1 - 1.0 K/uL   Eosinophils Relative 0 %   Eosinophils Absolute 0.0 0.0 - 0.7 K/uL   Basophils Relative 0 %   Basophils Absolute 0.0 0.0 - 0.1 K/uL  Comprehensive metabolic panel  Result Value Ref Range   Sodium 144 135 - 145 mmol/L   Potassium 4.5 3.5 - 5.1 mmol/L   Chloride 111 101 - 111 mmol/L   CO2 24 22 - 32 mmol/L   Glucose, Bld 150 (H) 65 - 99 mg/dL   BUN 56 (H) 6 - 20 mg/dL   Creatinine, Ser 4.76 (H) 0.61 - 1.24 mg/dL   Calcium 9.2 8.9 - 10.3 mg/dL   Total Protein 6.8 6.5 - 8.1 g/dL   Albumin 3.4 (L) 3.5 - 5.0 g/dL   AST 18 15 - 41 U/L   ALT 12 (L) 17 - 63 U/L   Alkaline Phosphatase 47 38 - 126 U/L   Total Bilirubin 0.6 0.3 - 1.2 mg/dL   GFR calc non Af Amer 11 (L) >60 mL/min   GFR calc Af  Amer 13 (L) >60 mL/min   Anion gap 9 5 - 15  Urinalysis, Routine w reflex microscopic (not at [facility])  Result Value Ref Range   Color, Urine YELLOW YELLOW   APPearance CLEAR CLEAR   Specific Gravity, Urine 1.025 1.005 - 1.030   pH 6.0 5.0 - 8.0   Glucose, UA NEGATIVE NEGATIVE mg/dL   Hgb urine dipstick TRACE (A) NEGATIVE   Bilirubin Urine NEGATIVE NEGATIVE   Ketones, ur NEGATIVE NEGATIVE mg/dL   Protein, ur 100 (A) NEGATIVE mg/dL   Nitrite NEGATIVE NEGATIVE   Leukocytes, UA TRACE (A) NEGATIVE  Urine microscopic-add on  Result Value Ref Range   Squamous Epithelial / LPF 0-5 (A) NONE SEEN   WBC, UA TOO NUMEROUS TO COUNT 0 - 5 WBC/hpf   RBC / HPF 0-5 0 - 5 RBC/hpf   Bacteria, UA FEW (A) NONE SEEN  Troponin I  Result Value Ref Range   Troponin I <0.03 <0.031 ng/mL  Lipase, blood  Result Value Ref Range   Lipase 34 11 - 51 U/L  CBG monitoring, ED  Result Value Ref Range   Glucose-Capillary 134 (H) 65 - 99 mg/dL   Ct Abdomen Pelvis Wo Contrast 09/13/2015  CLINICAL DATA:  Acute onset of vomiting for 3 days. Initial encounter. EXAM: CT ABDOMEN AND PELVIS WITHOUT CONTRAST TECHNIQUE: Multidetector CT imaging of the abdomen and pelvis was performed following the standard protocol without IV contrast. COMPARISON:  CT of the abdomen and pelvis from 07/08/2015 FINDINGS: Minimal bibasilar atelectasis is noted. Trace pericardial fluid remains within normal limits. Diffuse coronary artery calcifications are seen. The liver and spleen are unremarkable in appearance. The gallbladder is within normal limits. The pancreas and adrenal glands are unremarkable. There is minimal right-sided hydronephrosis, with distention of the right renal pelvis. Mildly asymmetric right perinephric stranding is noted, with wall thickening along the course of the right ureter. No distal obstructing stone is seen. This raises concern  for acute right-sided ureteritis and pyelonephritis. A 2.6 cm cyst is noted at the interpole  region of the left kidney. There is slight prominence of the left ureter. No free fluid is identified. The small bowel is unremarkable in appearance. The stomach is within normal limits. No acute vascular abnormalities are seen. Minimal calcification is noted along the distal abdominal aorta and its branches. The appendix is normal in caliber, without evidence of appendicitis. The colon is unremarkable in appearance. The bladder is diffusely thick walled, concerning for cystitis, though some of this may reflect chronic inflammation. The prostate is enlarged, measuring 5.5 cm in transverse dimension. No inguinal lymphadenopathy is seen. No acute osseous abnormalities are identified. IMPRESSION: 1. Minimal right-sided hydronephrosis, with distention of the right renal pelvis. Mildly asymmetric right perinephric stranding and right ureteral wall thickening. This raises concern for acute right-sided ureteritis and pyelonephritis. No distal obstructing stone seen. 2. Diffusely thick-walled bladder raises question for cystitis, though some of this may reflect chronic inflammation. 3. Left renal cyst noted. Slight prominence of the left ureter, without additional abnormality on the left side. 4. Diffuse coronary artery calcifications seen. 5. Enlarged prostate seen. Electronically Signed   By: Garald Balding M.D.   On: 09/13/2015 19:29   Dg Chest 2 View 09/13/2015  CLINICAL DATA:  Vomiting for 3 days EXAM: CHEST - 2 VIEW COMPARISON:  None. FINDINGS: The heart size and mediastinal contours are within normal limits. Both lungs are clear. The visualized skeletal structures are unremarkable. IMPRESSION: No active disease. Electronically Signed   By: Inez Catalina M.D.   On: 09/13/2015 17:39    2000:  No N/V while in the ED. +UTI, UC is pending. CT scan with pyelonephritis; will dose IV rocephin.  T/C to [facility] Dr. Anastasio Champion, case discussed, including:  HPI, pertinent PM/SHx, VS/PE, dx testing, ED course and treatment:   Agreeable to admit, requests to write temporary orders, obtain medical bed to Dr. Josephine Cables service.   Francine Graven, DO 09/16/15 [phone]

## 2015-09-13 NOTE — ED Notes (Signed)
Pt resident of [facility].  Reports vomiting x 3 days.  Denies diarrhea or pain. LBM was yesterday and was normal per pt.

## 2015-09-13 NOTE — H&P (Signed)
[facility] History and Physical  Zachary [name] [id number] DOB: 02-26-1946 DOA: 09/13/2015  Referring physician: ER PCP: [name], MD   Chief Complaint: Vomiting  HPI: Zachary [name] is a 69 y.o. male  This is a 69 year old man who has a history of mental retardation. His history is limited because of this. He lives at [facility] and has diabetes and hypertension. According to the staff at the facility, he has had vague generalized abdominal pain associated with vomiting. There is been no fevers, diarrhea, back pain, cough or dyspnea. Evaluation in the emergency room shows him to have CT scan changes consistent with right-sided hydronephrosis and possible acute right-sided polynephritis and ureteritis. He is now being admitted for further management.   Review of Systems:  Apart from symptoms above, all systems are negative.  Past Medical History  Diagnosis Date  . Hypertension   . Diabetes mellitus   . Smoking   . Abnormal CT scan, kidney 10/06/2011  . UTI (lower urinary tract infection) 10/06/2011  . Bladder wall thickening 10/06/2011  . 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 Poor historian poor historian  . BPH (benign prostatic hypertrophy)   . Anxiety     mental retardation  . Hypothyroidism   . MR (mental retardation)   . Acute pyelonephritis 10/07/2011  . Hyperkalemia   . Renal insufficiency     chronic history  . Renal failure (ARF), acute on chronic (HCC)   . Anemia     normocytic  . Edema      history of lower extremity edema  . Hyperlipidemia   . Impaired speech    Past Surgical History  Procedure Laterality Date  . Cystoscopy with urethral dilatation N/A 12/29/2013    Procedure: CYSTOSCOPY WITH URETHRAL DILATATION;  Surgeon: Marissa Nestle, MD;  Location: [facility];  Service: Urology;  Laterality: N/A;  . Transurethral resection of prostate N/A 01/04/2014    Procedure: TRANSURETHRAL RESECTION OF THE PROSTATE (TURP) (procedure #2);  Surgeon: Marissa Nestle, MD;  Location: [facility];  Service: Urology;  Laterality: N/A;  . Circumcision N/A 01/04/2014    Procedure: CIRCUMCISION ADULT (procedure #1);  Surgeon: Marissa Nestle, MD;  Location: [facility];  Service: Urology;  Laterality: N/A;  . Cystoscopy w/ retrogrades Bilateral 06/29/2015    Procedure: CYSTOSCOPY, DILATION OF URETHRAL STRICTURE WITH BILATERAL RETROGRADE PYELOGRAM,SUPRAPUBIC TUBE CHANGE;  Surgeon: Festus Aloe, MD;  Location: [facility];  Service: Urology;  Laterality: Bilateral;  . Av fistula placement Left 07/06/2015    Procedure:  INSERTION LEFT ARM ARTERIOVENOUS GORTEX GRAFT;  Surgeon: Angelia Mould, MD;  Location: [facility];  Service: Vascular;  Laterality: Left;   Social History:  reports that he has never smoked. He has never used smokeless tobacco. He reports that he does not drink alcohol or use illicit drugs.  No Known Allergies  Family History  Problem Relation Age of Onset  . Cancer Mother       Prior to Admission medications   Medication Sig Start Date End Date Taking? Authorizing Provider  carvedilol (COREG) 6.25 MG tablet Take 1 tablet (6.25 mg total) by mouth 2 (two) times daily with a meal. 03/08/15  Yes [name], MD  ferrous sulfate 325 (65 FE) MG tablet Take 325 mg by mouth daily with breakfast.   Yes Historical Provider, MD  HYDROcodone-acetaminophen (NORCO/VICODIN) 5-325 MG per tablet Take 1 tablet by mouth every 6 (six) hours as needed for moderate pain. 07/06/15  Yes Alvia Grove, PA-C  isosorbide-hydrALAZINE (  BIDIL) 20-37.5 MG per tablet Take 1 tablet by mouth 3 (three) times daily. Patient taking differently: Take 1 tablet by mouth 3 (three) times daily. 8am,2pm,8pm 03/08/15  Yes [name], MD  levothyroxine (SYNTHROID, LEVOTHROID) 75 MCG tablet Take 75 mcg by mouth daily before breakfast.    Yes Historical Provider, MD  linagliptin (TRADJENTA) 5 MG TABS tablet Take 5 mg by mouth daily.   Yes Historical Provider, MD  niacin (NIASPAN) 1000 MG  CR tablet Take 1,000 mg by mouth at bedtime.   Yes Historical Provider, MD  nitrofurantoin (MACRODANTIN) 100 MG capsule Take 100 mg by mouth 2 (two) times daily.  06/11/15  Yes Historical Provider, MD  omeprazole (PRILOSEC) 20 MG capsule Take 40 mg by mouth daily.   Yes Historical Provider, MD  simvastatin (ZOCOR) 20 MG tablet Take 20 mg by mouth at bedtime.  02/28/15  Yes Historical Provider, MD  sodium polystyrene (KAYEXALATE) 15 GM/60ML suspension Take 30 g by mouth. Daily on Monday and Friday   Yes Historical Provider, MD  tamsulosin (FLOMAX) 0.4 MG CAPS capsule Take 0.4 mg by mouth daily.   Yes Historical Provider, MD  torsemide (DEMADEX) 20 MG tablet Take 20 mg by mouth daily. Daily at 8am 06/13/15  Yes Historical Provider, MD  Vitamin D, Ergocalciferol, (DRISDOL) 50000 UNITS CAPS capsule Take 1 capsule (50,000 Units total) by mouth every 7 (seven) days. Patient taking differently: Take 50,000 Units by mouth every 7 (seven) days. On Monday 03/08/15  Yes [name], MD  ciprofloxacin (CIPRO) 500 MG tablet Take 1 tablet (500 mg total) by mouth daily. Patient not taking: Reported on 09/13/2015 07/23/15   [name], MD   Physical Exam: Filed Vitals:   09/13/15 1830 09/13/15 1900 09/13/15 1930 09/13/15 2030  BP: 121/76 132/71 131/76 133/75  Pulse: 100 100 99   Temp:      TempSrc:      Resp: 14 19 11 21   Weight:      SpO2: 100% 96% 97%     Wt Readings from Last 3 Encounters:  09/13/15 92.08 kg (203 lb)  08/08/15 92.08 kg (203 lb)  07/20/15 76.658 kg (169 lb)    General:  Appears calm and comfortable. He does not look toxic or septic. Eyes: PERRL, normal lids, irises & conjunctiva ENT: grossly normal hearing, lips & tongue Neck: no LAD, masses or thyromegaly Cardiovascular: RRR, no m/r/g. No LE edema. Telemetry: SR, no arrhythmias  Respiratory: CTA bilaterally, no w/r/r. Normal respiratory effort. Abdomen: soft, ntnd Skin: no rash or induration seen on limited  exam Musculoskeletal: grossly normal tone BUE/BLE Psychiatric: not examined. Neurological: grossly non-focal.          Labs on Admission:  Basic Metabolic Panel:  Recent Labs Lab 09/13/15 1442  NA 144  K 4.5  CL 111  CO2 24  GLUCOSE 150*  BUN 56*  CREATININE 4.76*  CALCIUM 9.2   Liver Function Tests:  Recent Labs Lab 09/13/15 1442  AST 18  ALT 12*  ALKPHOS 47  BILITOT 0.6  PROT 6.8  ALBUMIN 3.4*    Recent Labs Lab 09/13/15 1442  LIPASE 34   No results for input(s): AMMONIA in the last 168 hours. CBC:  Recent Labs Lab 09/13/15 1442  WBC 12.0*  NEUTROABS 9.8*  HGB 8.2*  HCT 26.1*  MCV 106.1*  PLT 132*   Cardiac Enzymes:  Recent Labs Lab 09/13/15 1442  TROPONINI <0.03    BNP (last 3 results) No results for input(s): BNP in  the last 8760 hours.  ProBNP (last 3 results) No results for input(s): PROBNP in the last 8760 hours.  CBG:  Recent Labs Lab 09/13/15 1408  GLUCAP 134*    Radiological Exams on Admission: Ct Abdomen Pelvis Wo Contrast  09/13/2015  CLINICAL DATA:  Acute onset of vomiting for 3 days. Initial encounter. EXAM: CT ABDOMEN AND PELVIS WITHOUT CONTRAST TECHNIQUE: Multidetector CT imaging of the abdomen and pelvis was performed following the standard protocol without IV contrast. COMPARISON:  CT of the abdomen and pelvis from 07/08/2015 FINDINGS: Minimal bibasilar atelectasis is noted. Trace pericardial fluid remains within normal limits. Diffuse coronary artery calcifications are seen. The liver and spleen are unremarkable in appearance. The gallbladder is within normal limits. The pancreas and adrenal glands are unremarkable. There is minimal right-sided hydronephrosis, with distention of the right renal pelvis. Mildly asymmetric right perinephric stranding is noted, with wall thickening along the course of the right ureter. No distal obstructing stone is seen. This raises concern for acute right-sided ureteritis and  pyelonephritis. A 2.6 cm cyst is noted at the interpole region of the left kidney. There is slight prominence of the left ureter. No free fluid is identified. The small bowel is unremarkable in appearance. The stomach is within normal limits. No acute vascular abnormalities are seen. Minimal calcification is noted along the distal abdominal aorta and its branches. The appendix is normal in caliber, without evidence of appendicitis. The colon is unremarkable in appearance. The bladder is diffusely thick walled, concerning for cystitis, though some of this may reflect chronic inflammation. The prostate is enlarged, measuring 5.5 cm in transverse dimension. No inguinal lymphadenopathy is seen. No acute osseous abnormalities are identified. IMPRESSION: 1. Minimal right-sided hydronephrosis, with distention of the right renal pelvis. Mildly asymmetric right perinephric stranding and right ureteral wall thickening. This raises concern for acute right-sided ureteritis and pyelonephritis. No distal obstructing stone seen. 2. Diffusely thick-walled bladder raises question for cystitis, though some of this may reflect chronic inflammation. 3. Left renal cyst noted. Slight prominence of the left ureter, without additional abnormality on the left side. 4. Diffuse coronary artery calcifications seen. 5. Enlarged prostate seen. Electronically Signed   By: Garald Balding M.D.   On: 09/13/2015 19:29   Dg Chest 2 View  09/13/2015  CLINICAL DATA:  Vomiting for 3 days EXAM: CHEST - 2 VIEW COMPARISON:  None. FINDINGS: The heart size and mediastinal contours are within normal limits. Both lungs are clear. The visualized skeletal structures are unremarkable. IMPRESSION: No active disease. Electronically Signed   By: Inez Catalina M.D.   On: 09/13/2015 17:39      Assessment/Plan   1.  Pyelonephritis. He will be treated with intravenous antibiotics. 2. Acute on chronic renal failure. He appears to be somewhat  hypovolemic/dehydrated. He will be treated with IV fluids and renal function will be monitored. Consider nephrology consultation if his renal function does not improve. 3. Diabetes. Continue with home medications and sliding scale insulin. 4. Anemia. This is likely secondary to chronic renal failure. The hemoglobin appears to be stable. This will be monitored closely. 5. Hypertension. Continue with home medications.   He'll be admitted to the medical floor. Further recommendations will depend on patient's hospital progress.   Code status: Full code  DVT Prophylaxis: Lovenox.   Family Communication: I discussed the plan with the patient's family at the bedside.    Disposition Plan: back to the facility when medically stable.    Time spent: 60 minutes.  Doree Albee [facility] Pager [phone].

## 2015-09-14 LAB — COMPREHENSIVE METABOLIC PANEL
ALT: 11 U/L — ABNORMAL LOW (ref 17–63)
AST: 17 U/L (ref 15–41)
Albumin: 3.1 g/dL — ABNORMAL LOW (ref 3.5–5.0)
Alkaline Phosphatase: 45 U/L (ref 38–126)
Anion gap: 12 (ref 5–15)
BUN: 66 mg/dL — ABNORMAL HIGH (ref 6–20)
CO2: 21 mmol/L — ABNORMAL LOW (ref 22–32)
Calcium: 8.8 mg/dL — ABNORMAL LOW (ref 8.9–10.3)
Chloride: 113 mmol/L — ABNORMAL HIGH (ref 101–111)
Creatinine, Ser: 5 mg/dL — ABNORMAL HIGH (ref 0.61–1.24)
GFR calc Af Amer: 13 mL/min — ABNORMAL LOW (ref >60)
GFR calc non Af Amer: 11 mL/min — ABNORMAL LOW (ref >60)
Glucose, Bld: 99 mg/dL (ref 65–99)
Potassium: 4.4 mmol/L (ref 3.5–5.1)
Sodium: 146 mmol/L — ABNORMAL HIGH (ref 135–145)
Total Bilirubin: 0.6 mg/dL (ref 0.3–1.2)
Total Protein: 6.3 g/dL — ABNORMAL LOW (ref 6.5–8.1)

## 2015-09-14 LAB — CBC
HCT: 26 % — ABNORMAL LOW (ref 39.0–52.0)
Hemoglobin: 8.2 g/dL — ABNORMAL LOW (ref 13.0–17.0)
MCH: 33.3 pg (ref 26.0–34.0)
MCHC: 31.5 g/dL (ref 30.0–36.0)
MCV: 105.7 fL — ABNORMAL HIGH (ref 78.0–100.0)
Platelets: 123 10*3/uL — ABNORMAL LOW (ref 150–400)
RBC: 2.46 MIL/uL — ABNORMAL LOW (ref 4.22–5.81)
RDW: 14.4 % (ref 11.5–15.5)
WBC: 10.7 10*3/uL — ABNORMAL HIGH (ref 4.0–10.5)

## 2015-09-14 LAB — GLUCOSE, CAPILLARY
Glucose-Capillary: 90 mg/dL (ref 65–99)
Glucose-Capillary: 86 mg/dL (ref 65–99)
Glucose-Capillary: 92 mg/dL (ref 65–99)
Glucose-Capillary: 93 mg/dL (ref 65–99)

## 2015-09-14 NOTE — Progress Notes (Signed)
Patient has not urinated since he has been on the floor. Pt made one attempt to use the urinal but states "i can't pee." Pt has a fistula in the left upper arm and when asked is he on dialysis he states "not yet.". Pt has a history of mental retardation so I am not sure how true this statement is. Bladder scan showed a high of 200 ml.Spoke to Dr.Mcinnis whoa asked was the pt in any discomfort and the pt is not. He states to just monitor for now. Will continue to monitor

## 2015-09-14 NOTE — Progress Notes (Signed)
Subjective: Patient was admitted yesterday due to nausea, vomiting and was found to have pyelonephritis. Patient was started  On iv antibiotics and patient feels better today.  Objective: Vital signs in last 24 hours: Temp:  [98.7 F (37.1 C)-98.9 F (37.2 C)] 98.7 F (37.1 C) (11/18 0443) Pulse Rate:  [97-109] 109 (11/18 0443) Resp:  [11-22] 15 (11/18 0443) BP: (108-153)/(60-76) 145/70 mmHg (11/18 0443) SpO2:  [95 %-100 %] 100 % (11/18 0443) Weight:  [81.738 kg (180 lb 3.2 oz)-92.08 kg (203 lb)] 81.738 kg (180 lb 3.2 oz) (11/17 2126) Weight change:  Last BM Date: 09/13/15  Intake/Output from previous day: 11/17 0701 - 11/18 0700 In: 936.7 [I.V.:936.7] Out: -   PHYSICAL EXAM General appearance: alert and no distress Resp: clear to auscultation bilaterally Cardio: S1, S2 normal GI: soft, non-tender; bowel sounds normal; no masses,  no organomegaly Extremities: extremities normal, atraumatic, no cyanosis or edema  Lab Results:  Results for orders placed or performed during the hospital encounter of 09/13/15 (from the past 48 hour(s))  CBG monitoring, ED     Status: Abnormal   Collection Time: 09/13/15  2:08 PM  Result Value Ref Range   Glucose-Capillary 134 (H) 65 - 99 mg/dL  Urinalysis, Routine w reflex microscopic (not at [facility])     Status: Abnormal   Collection Time: 09/13/15  2:10 PM  Result Value Ref Range   Color, Urine YELLOW YELLOW   APPearance CLEAR CLEAR   Specific Gravity, Urine 1.025 1.005 - 1.030   pH 6.0 5.0 - 8.0   Glucose, UA NEGATIVE NEGATIVE mg/dL   Hgb urine dipstick TRACE (A) NEGATIVE   Bilirubin Urine NEGATIVE NEGATIVE   Ketones, ur NEGATIVE NEGATIVE mg/dL   Protein, ur 100 (A) NEGATIVE mg/dL   Nitrite NEGATIVE NEGATIVE   Leukocytes, UA TRACE (A) NEGATIVE  Urine microscopic-add on     Status: Abnormal   Collection Time: 09/13/15  2:10 PM  Result Value Ref Range   Squamous Epithelial / LPF 0-5 (A) NONE SEEN    Comment: Please note change in  reference range.   WBC, UA TOO NUMEROUS TO COUNT 0 - 5 WBC/hpf    Comment: Please note change in reference range.   RBC / HPF 0-5 0 - 5 RBC/hpf    Comment: Please note change in reference range.   Bacteria, UA FEW (A) NONE SEEN    Comment: Please note change in reference range.  CBC with Differential     Status: Abnormal   Collection Time: 09/13/15  2:42 PM  Result Value Ref Range   WBC 12.0 (H) 4.0 - 10.5 K/uL   RBC 2.46 (L) 4.22 - 5.81 MIL/uL   Hemoglobin 8.2 (L) 13.0 - 17.0 g/dL   HCT 26.1 (L) 39.0 - 52.0 %   MCV 106.1 (H) 78.0 - 100.0 fL   MCH 33.3 26.0 - 34.0 pg   MCHC 31.4 30.0 - 36.0 g/dL   RDW 14.8 11.5 - 15.5 %   Platelets 132 (L) 150 - 400 K/uL   Neutrophils Relative % 82 %   Neutro Abs 9.8 (H) 1.7 - 7.7 K/uL   Lymphocytes Relative 7 %   Lymphs Abs 0.8 0.7 - 4.0 K/uL   Monocytes Relative 11 %   Monocytes Absolute 1.4 (H) 0.1 - 1.0 K/uL   Eosinophils Relative 0 %   Eosinophils Absolute 0.0 0.0 - 0.7 K/uL   Basophils Relative 0 %   Basophils Absolute 0.0 0.0 - 0.1 K/uL  Comprehensive metabolic panel  Status: Abnormal   Collection Time: 09/13/15  2:42 PM  Result Value Ref Range   Sodium 144 135 - 145 mmol/L   Potassium 4.5 3.5 - 5.1 mmol/L   Chloride 111 101 - 111 mmol/L   CO2 24 22 - 32 mmol/L   Glucose, Bld 150 (H) 65 - 99 mg/dL   BUN 56 (H) 6 - 20 mg/dL   Creatinine, Ser 4.76 (H) 0.61 - 1.24 mg/dL   Calcium 9.2 8.9 - 10.3 mg/dL   Total Protein 6.8 6.5 - 8.1 g/dL   Albumin 3.4 (L) 3.5 - 5.0 g/dL   AST 18 15 - 41 U/L   ALT 12 (L) 17 - 63 U/L   Alkaline Phosphatase 47 38 - 126 U/L   Total Bilirubin 0.6 0.3 - 1.2 mg/dL   GFR calc non Af Amer 11 (L) >60 mL/min   GFR calc Af Amer 13 (L) >60 mL/min    Comment: (NOTE) The eGFR has been calculated using the CKD EPI equation. This calculation has not been validated in all clinical situations. eGFR's persistently <60 mL/min signify possible Chronic Kidney Disease.    Anion gap 9 5 - 15  Troponin I     Status:  None   Collection Time: 09/13/15  2:42 PM  Result Value Ref Range   Troponin I <0.03 <0.031 ng/mL    Comment:        NO INDICATION OF MYOCARDIAL INJURY.   Lipase, blood     Status: None   Collection Time: 09/13/15  2:42 PM  Result Value Ref Range   Lipase 34 11 - 51 U/L  Glucose, capillary     Status: Abnormal   Collection Time: 09/13/15 10:23 PM  Result Value Ref Range   Glucose-Capillary 108 (H) 65 - 99 mg/dL   Comment 1 Notify RN    Comment 2 Document in Chart   Comprehensive metabolic panel     Status: Abnormal   Collection Time: 09/14/15  5:58 AM  Result Value Ref Range   Sodium 146 (H) 135 - 145 mmol/L   Potassium 4.4 3.5 - 5.1 mmol/L   Chloride 113 (H) 101 - 111 mmol/L   CO2 21 (L) 22 - 32 mmol/L   Glucose, Bld 99 65 - 99 mg/dL   BUN 66 (H) 6 - 20 mg/dL   Creatinine, Ser 5.00 (H) 0.61 - 1.24 mg/dL   Calcium 8.8 (L) 8.9 - 10.3 mg/dL   Total Protein 6.3 (L) 6.5 - 8.1 g/dL   Albumin 3.1 (L) 3.5 - 5.0 g/dL   AST 17 15 - 41 U/L   ALT 11 (L) 17 - 63 U/L   Alkaline Phosphatase 45 38 - 126 U/L   Total Bilirubin 0.6 0.3 - 1.2 mg/dL   GFR calc non Af Amer 11 (L) >60 mL/min   GFR calc Af Amer 13 (L) >60 mL/min    Comment: (NOTE) The eGFR has been calculated using the CKD EPI equation. This calculation has not been validated in all clinical situations. eGFR's persistently <60 mL/min signify possible Chronic Kidney Disease.    Anion gap 12 5 - 15  CBC     Status: Abnormal   Collection Time: 09/14/15  5:58 AM  Result Value Ref Range   WBC 10.7 (H) 4.0 - 10.5 K/uL   RBC 2.46 (L) 4.22 - 5.81 MIL/uL   Hemoglobin 8.2 (L) 13.0 - 17.0 g/dL   HCT 26.0 (L) 39.0 - 52.0 %   MCV 105.7 (H) 78.0 -  100.0 fL   MCH 33.3 26.0 - 34.0 pg   MCHC 31.5 30.0 - 36.0 g/dL   RDW 14.4 11.5 - 15.5 %   Platelets 123 (L) 150 - 400 K/uL  Glucose, capillary     Status: None   Collection Time: 09/14/15  7:36 AM  Result Value Ref Range   Glucose-Capillary 90 65 - 99 mg/dL    ABGS No results for  input(s): PHART, PO2ART, TCO2, HCO3 in the last 72 hours.  Invalid input(s): PCO2 CULTURES No results found for this or any previous visit (from the past 240 hour(s)). Studies/Results: Ct Abdomen Pelvis Wo Contrast  09/13/2015  CLINICAL DATA:  Acute onset of vomiting for 3 days. Initial encounter. EXAM: CT ABDOMEN AND PELVIS WITHOUT CONTRAST TECHNIQUE: Multidetector CT imaging of the abdomen and pelvis was performed following the standard protocol without IV contrast. COMPARISON:  CT of the abdomen and pelvis from 07/08/2015 FINDINGS: Minimal bibasilar atelectasis is noted. Trace pericardial fluid remains within normal limits. Diffuse coronary artery calcifications are seen. The liver and spleen are unremarkable in appearance. The gallbladder is within normal limits. The pancreas and adrenal glands are unremarkable. There is minimal right-sided hydronephrosis, with distention of the right renal pelvis. Mildly asymmetric right perinephric stranding is noted, with wall thickening along the course of the right ureter. No distal obstructing stone is seen. This raises concern for acute right-sided ureteritis and pyelonephritis. A 2.6 cm cyst is noted at the interpole region of the left kidney. There is slight prominence of the left ureter. No free fluid is identified. The small bowel is unremarkable in appearance. The stomach is within normal limits. No acute vascular abnormalities are seen. Minimal calcification is noted along the distal abdominal aorta and its branches. The appendix is normal in caliber, without evidence of appendicitis. The colon is unremarkable in appearance. The bladder is diffusely thick walled, concerning for cystitis, though some of this may reflect chronic inflammation. The prostate is enlarged, measuring 5.5 cm in transverse dimension. No inguinal lymphadenopathy is seen. No acute osseous abnormalities are identified. IMPRESSION: 1. Minimal right-sided hydronephrosis, with distention of  the right renal pelvis. Mildly asymmetric right perinephric stranding and right ureteral wall thickening. This raises concern for acute right-sided ureteritis and pyelonephritis. No distal obstructing stone seen. 2. Diffusely thick-walled bladder raises question for cystitis, though some of this may reflect chronic inflammation. 3. Left renal cyst noted. Slight prominence of the left ureter, without additional abnormality on the left side. 4. Diffuse coronary artery calcifications seen. 5. Enlarged prostate seen. Electronically Signed   By: Garald Balding M.D.   On: 09/13/2015 19:29   Dg Chest 2 View  09/13/2015  CLINICAL DATA:  Vomiting for 3 days EXAM: CHEST - 2 VIEW COMPARISON:  None. FINDINGS: The heart size and mediastinal contours are within normal limits. Both lungs are clear. The visualized skeletal structures are unremarkable. IMPRESSION: No active disease. Electronically Signed   By: Inez Catalina M.D.   On: 09/13/2015 17:39    Medications: I have reviewed the patient's current medications.  Assesment:   Active Problems:   DM (diabetes mellitus), type 2, uncontrolled ([place])   Mental retardation   Anemia, chronic disease   Acute renal failure superimposed on stage 4 chronic kidney disease ([place])   Essential hypertension   Pyelonephritis    Plan:  Medications reviewed Continue iv antibiotics Nephrology consult Will follow culture result    LOS: 1 day   Quin Mathenia 09/14/2015, 8:23 AM

## 2015-09-14 NOTE — Clinical Social Work Note (Signed)
Clinical Social Work Assessment  Patient Details  Name: Zachary [name] MRN: [id number] Date of Birth: 1946/06/05  Date of referral:  09/14/15               Reason for consult:  Facility Placement                Permission sought to share information with:    Permission granted to share information::     Name::        Agency::     Relationship::     Contact Information:     Housing/Transportation Living arrangements for the past 2 months:  [place] of Information:  Patient, Facility Patient Interpreter Needed:  None Criminal Activity/Legal Involvement Pertinent to Current Situation/Hospitalization:  No - Comment as needed Significant Relationships:  Dependent Children, Siblings Lives with:  Facility Resident Do you feel safe going back to the place where you live?  Yes Need for family participation in patient care:  Yes (Comment)  Care giving concerns: None identified.   Social Worker assessment / plan:  Patient advised that he has been at [facility] "a pretty good while." He stated that he like being there.  Patient stated that he ambulates unassisted and that staff assists him with bathing and dressing. Patient advised that his siblings visit him and he sees his daughter on weekends.  Patient stated that he plans to return to [facility] upon discharge.  CSW spoke with Tammy at [facility] She stated that patient has been in the facility since May, 2016.  She confirmed patient's statements. She advised that patient can return to the facility upon discharge.  She advised that patient's legal guardian with DSS, [name], is out of the office currently, however, she made her aware of patient going to the hospital on the day he went to the facility.  CSW left a message for [facility] requesting return contact.     Employment status:  Disabled (Comment on whether or not currently receiving Disability) Insurance information:  Medicare PT  Recommendations:  Not assessed at this time Information / Referral to community resources:     Patient/Family's Response to care:  Patient agreeable to return to facility.  Patient/Family's Understanding of and Emotional Response to Diagnosis, Current Treatment, and Prognosis:  Patient verbalizes understanding of his current diagnosis, treatment and prognosis.   Emotional Assessment Appearance:  Appears stated age Attitude/Demeanor/Rapport:   (Cooperative and pleasant) Affect (typically observed):  Happy Orientation:  Oriented to Self, Oriented to Place, Oriented to  Time, Oriented to Situation Alcohol / Substance use:  Not Applicable Psych involvement (Current and /or in the community):  No (Comment)  Discharge Needs  Concerns to be addressed:  Discharge Planning Concerns Readmission within the last 30 days:  No Current discharge risk:  Chronically ill Barriers to Discharge:  No Barriers Identified   Ihor Gully, LCSW 09/14/2015, 11:06 AM

## 2015-09-14 NOTE — Care Management Note (Signed)
Case Management Note  Patient Details  Name: Zachary [name] MRN: [id number] Date of Birth: 1946-02-21  Subjective/Objective:                  Pt admitted from [facility] with pyelonephritis. Anticipate pt will discharge to facility.  Action/Plan: CSW is aware and will arrange discharge to facility when medically stable.  Expected Discharge Date:                  Expected Discharge Plan:  Assisted Living / Rest Home  In-House Referral:  Clinical Social Work  Discharge planning Services  CM Consult  Post Acute Care Choice:  NA Choice offered to:  NA  DME Arranged:    DME Agency:     HH Arranged:    [place] Agency:     Status of Service:  Completed, signed off  Medicare Important Message Given:  Yes Date Medicare IM Given:    Medicare IM give by:    Date Additional Medicare IM Given:    Additional Medicare Important Message give by:     If discussed at [place] of Stay Meetings, dates discussed:    Additional Comments:  Joylene Draft, RN 09/14/2015, 8:38 AM

## 2015-09-14 NOTE — Care Management Important Message (Signed)
Important Message  Patient Details  Name: MOISHY ROMM MRN: [id number] Date of Birth: 1945-11-02   Medicare Important Message Given:  Yes    Joylene Draft, RN 09/14/2015, 8:37 AM

## 2015-09-14 NOTE — NC FL2 (Deleted)
[place] LEVEL OF CARE SCREENING TOOL     IDENTIFICATION  Patient Name: Zachary [name] Birthdate: 04/07/1946 Sex: male Admission Date (Current Location): 09/13/2015  [facility] and [place] Number:     Facility and Address:  [facility] [address], [place]      Provider Number: [id number]  Attending Physician Name and Address:  [name], MD  Relative Name and Phone Number:       Current Level of Care: Hospital Recommended Level of Care: [place] Prior Approval Number:    Date Approved/Denied:   PASRR Number:    Discharge Plan: Other (Comment) [facility])    Current Diagnoses: Patient Active Problem List   Diagnosis Date Noted  . Pyelonephritis 09/13/2015  . UTI (lower urinary tract infection) 07/20/2015  . Heme positive stool   . Absolute anemia   . Abnormal CT scan, esophagus   . Protein-calorie malnutrition, severe ([place]) 07/10/2015  . Acute renal failure superimposed on stage 4 chronic kidney disease ([place]) 07/08/2015  . Nausea and vomiting 07/08/2015  . Essential hypertension 07/08/2015  . Hypernatremia 07/08/2015  . Hyperlipidemia 07/08/2015  . Nausea & vomiting 07/08/2015  . Hyperkalemia 05/22/2015  . Obstructive uropathy 04/17/2015  . ARF (acute renal failure) ([place]) 04/17/2015  . Bladder neck contracture   . Urinary retention   . Anemia, chronic disease 04/16/2015  . BPH (benign prostatic hypertrophy) with urinary retention 03/03/2015  . BPH (benign prostatic hypertrophy) with urinary obstruction 01/04/2014  . BPH (benign prostatic hypertrophy) 12/29/2013  . Acute renal failure ([place]) 12/25/2013  . Renal insufficiency 12/25/2013  . Dehydration 12/25/2013  . Mental retardation 10/10/2011  . Acute pyelonephritis 10/07/2011  . 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 Bladder wall thickening 10/06/2011  . DM (diabetes mellitus), type 2, uncontrolled ([place]) 10/02/2011  . Iron deficiency  anemia 10/02/2011  . Bacteremia 10/02/2011  . FX CLOSED FIBULA NOS 02/10/2008    Orientation ACTIVITIES/SOCIAL BLADDER RESPIRATION    Place, Situation, Time, Self  Family supportive Continent Normal  BEHAVIORAL SYMPTOMS/MOOD NEUROLOGICAL BOWEL NUTRITION STATUS      Continent Diet (Diet Carb Modified)  PHYSICIAN VISITS COMMUNICATION OF NEEDS Height & Weight Skin    Verbally 5\' 8"  (172.7 cm) 180 lbs. Normal          AMBULATORY STATUS RESPIRATION    Assist independent Normal      Personal Care Assistance Level of Assistance  Dressing, Bathing Bathing Assistance: Limited assistance   Dressing Assistance: Limited assistance      Functional Limitations Info  Speech (Stuttering)     Speech Info: Impaired (Stuttering)       SPECIAL CARE FACTORS FREQUENCY                      Additional Factors Info   (Full Code)               Current Medications (09/14/2015): Current Facility-Administered Medications  Medication Dose Route Frequency Provider Last Rate Last Dose  . 0.9 %  sodium chloride infusion   Intravenous Continuous Doree Albee, MD 100 mL/hr at 09/13/15 2126    . carvedilol (COREG) tablet 6.25 mg  6.25 mg Oral BID WC Nimish C Gosrani, MD   6.25 mg at 09/14/15 1023  . cefTRIAXone (ROCEPHIN) 1 g in dextrose 5 % 50 mL IVPB  1 g Intravenous Q24H Doree Albee, MD   0 g at 09/13/15 2051  . enoxaparin (LOVENOX) injection 30  mg  30 mg Subcutaneous Q24H Nimish C Gosrani, MD   30 mg at 09/13/15 2300  . ferrous sulfate tablet 325 mg  325 mg Oral Q breakfast Nimish C Anastasio Champion, MD   325 mg at 09/14/15 1022  . HYDROcodone-acetaminophen (NORCO/VICODIN) 5-325 MG per tablet 1 tablet  1 tablet Oral Q6H PRN Nimish C Gosrani, MD      . insulin aspart (novoLOG) injection 0-5 Units  0-5 Units Subcutaneous QHS Doree Albee, MD   0 Units at 09/13/15 2301  . insulin aspart (novoLOG) injection 0-9 Units  0-9 Units Subcutaneous TID WC Nimish Luther Parody, MD   0 Units at  09/14/15 0800  . isosorbide-hydrALAZINE (BIDIL) 20-37.5 MG per tablet 1 tablet  1 tablet Oral TID Doree Albee, MD   1 tablet at 09/14/15 1023  . levothyroxine (SYNTHROID, LEVOTHROID) tablet 75 mcg  75 mcg Oral QAC breakfast Doree Albee, MD   75 mcg at 09/14/15 1023  . linagliptin (TRADJENTA) tablet 5 mg  5 mg Oral Daily Nimish C Gosrani, MD   5 mg at 09/14/15 1023  . niacin (NIASPAN) CR tablet 1,000 mg  1,000 mg Oral QHS Nimish C Gosrani, MD   1,000 mg at 09/13/15 2200  . ondansetron (ZOFRAN) tablet 4 mg  4 mg Oral Q6H PRN Nimish C Gosrani, MD       Or  . ondansetron (ZOFRAN) injection 4 mg  4 mg Intravenous Q6H PRN Doree Albee, MD   4 mg at 09/14/15 0508  . pantoprazole (PROTONIX) EC tablet 40 mg  40 mg Oral Daily Nimish C Anastasio Champion, MD   40 mg at 09/14/15 1023  . tamsulosin (FLOMAX) capsule 0.4 mg  0.4 mg Oral Daily Nimish C Gosrani, MD   0.4 mg at 09/14/15 1022   Do not use this list as official medication orders. Please verify with discharge summary.  Discharge Medications:   Medication List    ASK your doctor about these medications        carvedilol 6.25 MG tablet  Commonly known as:  COREG  Take 1 tablet (6.25 mg total) by mouth 2 (two) times daily with a meal.     ciprofloxacin 500 MG tablet  Commonly known as:  CIPRO  Take 1 tablet (500 mg total) by mouth daily.     ferrous sulfate 325 (65 FE) MG tablet  Take 325 mg by mouth daily with breakfast.     HYDROcodone-acetaminophen 5-325 MG tablet  Commonly known as:  NORCO/VICODIN  Take 1 tablet by mouth every 6 (six) hours as needed for moderate pain.     isosorbide-hydrALAZINE 20-37.5 MG tablet  Commonly known as:  BIDIL  Take 1 tablet by mouth 3 (three) times daily.     levothyroxine 75 MCG tablet  Commonly known as:  SYNTHROID, LEVOTHROID  Take 75 mcg by mouth daily before breakfast.     linagliptin 5 MG Tabs tablet  Commonly known as:  TRADJENTA  Take 5 mg by mouth daily.     niacin 1000 MG CR  tablet  Commonly known as:  NIASPAN  Take 1,000 mg by mouth at bedtime.     nitrofurantoin 100 MG capsule  Commonly known as:  MACRODANTIN  Take 100 mg by mouth 2 (two) times daily.     omeprazole 20 MG capsule  Commonly known as:  PRILOSEC  Take 40 mg by mouth daily.     simvastatin 20 MG tablet  Commonly known as:  ZOCOR  Take 20 mg by mouth at bedtime.     sodium polystyrene 15 GM/60ML suspension  Commonly known as:  KAYEXALATE  Take 30 g by mouth. Daily on Monday and Friday     tamsulosin 0.4 MG Caps capsule  Commonly known as:  FLOMAX  Take 0.4 mg by mouth daily.     torsemide 20 MG tablet  Commonly known as:  DEMADEX  Take 20 mg by mouth daily. Daily at 8am     Vitamin D (Ergocalciferol) 50000 UNITS Caps capsule  Commonly known as:  DRISDOL  Take 1 capsule (50,000 Units total) by mouth every 7 (seven) days.        Relevant Imaging Results:  Relevant Lab Results:  Recent Labs    Additional Information    Denisa Enterline, Clydene Pugh, LCSW

## 2015-09-15 LAB — BASIC METABOLIC PANEL
Anion gap: 9 (ref 5–15)
BUN: 66 mg/dL — ABNORMAL HIGH (ref 6–20)
CO2: 21 mmol/L — ABNORMAL LOW (ref 22–32)
Calcium: 8.5 mg/dL — ABNORMAL LOW (ref 8.9–10.3)
Chloride: 111 mmol/L (ref 101–111)
Creatinine, Ser: 5.24 mg/dL — ABNORMAL HIGH (ref 0.61–1.24)
GFR calc Af Amer: 12 mL/min — ABNORMAL LOW (ref >60)
GFR calc non Af Amer: 10 mL/min — ABNORMAL LOW (ref >60)
Glucose, Bld: 120 mg/dL — ABNORMAL HIGH (ref 65–99)
Potassium: 4.2 mmol/L (ref 3.5–5.1)
Sodium: 141 mmol/L (ref 135–145)

## 2015-09-15 LAB — GLUCOSE, CAPILLARY
Glucose-Capillary: 131 mg/dL — ABNORMAL HIGH (ref 65–99)
Glucose-Capillary: 125 mg/dL — ABNORMAL HIGH (ref 65–99)
Glucose-Capillary: 136 mg/dL — ABNORMAL HIGH (ref 65–99)
Glucose-Capillary: 122 mg/dL — ABNORMAL HIGH (ref 65–99)

## 2015-09-15 LAB — URINE CULTURE: Culture: NO GROWTH

## 2015-09-15 NOTE — Progress Notes (Signed)
Subjective: He says he feels okay. He was admitted with acute on chronic renal failure. He has no complaints  Objective: Vital signs in last 24 hours: Temp:  [98 F (36.7 C)-99.7 F (37.6 C)] 98 F (36.7 C) (11/19 0621) Pulse Rate:  [74-103] 100 (11/19 0621) Resp:  [16] 16 (11/19 0621) BP: (102-147)/(44-73) 147/71 mmHg (11/19 0621) SpO2:  [97 %-100 %] 100 % (11/19 0621) Weight change:  Last BM Date: 09/13/15  Intake/Output from previous day: 11/18 0701 - 11/19 0700 In: 2240 [P.O.:120; I.V.:2120] Out: 1250 [Urine:1250]  PHYSICAL EXAM General appearance: alert, cooperative and no distress Resp: clear to auscultation bilaterally Cardio: regular rate and rhythm, S1, S2 normal, no murmur, click, rub or gallop GI: soft, non-tender; bowel sounds normal; no masses,  no organomegaly Extremities: extremities normal, atraumatic, no cyanosis or edema  Lab Results:  Results for orders placed or performed during the hospital encounter of 09/13/15 (from the past 48 hour(s))  CBG monitoring, ED     Status: Abnormal   Collection Time: 09/13/15  2:08 PM  Result Value Ref Range   Glucose-Capillary 134 (H) 65 - 99 mg/dL  Urinalysis, Routine w reflex microscopic (not at [facility])     Status: Abnormal   Collection Time: 09/13/15  2:10 PM  Result Value Ref Range   Color, Urine YELLOW YELLOW   APPearance CLEAR CLEAR   Specific Gravity, Urine 1.025 1.005 - 1.030   pH 6.0 5.0 - 8.0   Glucose, UA NEGATIVE NEGATIVE mg/dL   Hgb urine dipstick TRACE (A) NEGATIVE   Bilirubin Urine NEGATIVE NEGATIVE   Ketones, ur NEGATIVE NEGATIVE mg/dL   Protein, ur 100 (A) NEGATIVE mg/dL   Nitrite NEGATIVE NEGATIVE   Leukocytes, UA TRACE (A) NEGATIVE  Urine microscopic-add on     Status: Abnormal   Collection Time: 09/13/15  2:10 PM  Result Value Ref Range   Squamous Epithelial / LPF 0-5 (A) NONE SEEN    Comment: Please note change in reference range.   WBC, UA TOO NUMEROUS TO COUNT 0 - 5 WBC/hpf    Comment:  Please note change in reference range.   RBC / HPF 0-5 0 - 5 RBC/hpf    Comment: Please note change in reference range.   Bacteria, UA FEW (A) NONE SEEN    Comment: Please note change in reference range.  CBC with Differential     Status: Abnormal   Collection Time: 09/13/15  2:42 PM  Result Value Ref Range   WBC 12.0 (H) 4.0 - 10.5 K/uL   RBC 2.46 (L) 4.22 - 5.81 MIL/uL   Hemoglobin 8.2 (L) 13.0 - 17.0 g/dL   HCT 26.1 (L) 39.0 - 52.0 %   MCV 106.1 (H) 78.0 - 100.0 fL   MCH 33.3 26.0 - 34.0 pg   MCHC 31.4 30.0 - 36.0 g/dL   RDW 14.8 11.5 - 15.5 %   Platelets 132 (L) 150 - 400 K/uL   Neutrophils Relative % 82 %   Neutro Abs 9.8 (H) 1.7 - 7.7 K/uL   Lymphocytes Relative 7 %   Lymphs Abs 0.8 0.7 - 4.0 K/uL   Monocytes Relative 11 %   Monocytes Absolute 1.4 (H) 0.1 - 1.0 K/uL   Eosinophils Relative 0 %   Eosinophils Absolute 0.0 0.0 - 0.7 K/uL   Basophils Relative 0 %   Basophils Absolute 0.0 0.0 - 0.1 K/uL  Comprehensive metabolic panel     Status: Abnormal   Collection Time: 09/13/15  2:42 PM  Result Value Ref Range   Sodium 144 135 - 145 mmol/L   Potassium 4.5 3.5 - 5.1 mmol/L   Chloride 111 101 - 111 mmol/L   CO2 24 22 - 32 mmol/L   Glucose, Bld 150 (H) 65 - 99 mg/dL   BUN 56 (H) 6 - 20 mg/dL   Creatinine, Ser 4.76 (H) 0.61 - 1.24 mg/dL   Calcium 9.2 8.9 - 10.3 mg/dL   Total Protein 6.8 6.5 - 8.1 g/dL   Albumin 3.4 (L) 3.5 - 5.0 g/dL   AST 18 15 - 41 U/L   ALT 12 (L) 17 - 63 U/L   Alkaline Phosphatase 47 38 - 126 U/L   Total Bilirubin 0.6 0.3 - 1.2 mg/dL   GFR calc non Af Amer 11 (L) >60 mL/min   GFR calc Af Amer 13 (L) >60 mL/min    Comment: (NOTE) The eGFR has been calculated using the CKD EPI equation. This calculation has not been validated in all clinical situations. eGFR's persistently <60 mL/min signify possible Chronic Kidney Disease.    Anion gap 9 5 - 15  Troponin I     Status: None   Collection Time: 09/13/15  2:42 PM  Result Value Ref Range    Troponin I <0.03 <0.031 ng/mL    Comment:        NO INDICATION OF MYOCARDIAL INJURY.   Lipase, blood     Status: None   Collection Time: 09/13/15  2:42 PM  Result Value Ref Range   Lipase 34 11 - 51 U/L  Glucose, capillary     Status: Abnormal   Collection Time: 09/13/15 10:23 PM  Result Value Ref Range   Glucose-Capillary 108 (H) 65 - 99 mg/dL   Comment 1 Notify RN    Comment 2 Document in Chart   Comprehensive metabolic panel     Status: Abnormal   Collection Time: 09/14/15  5:58 AM  Result Value Ref Range   Sodium 146 (H) 135 - 145 mmol/L   Potassium 4.4 3.5 - 5.1 mmol/L   Chloride 113 (H) 101 - 111 mmol/L   CO2 21 (L) 22 - 32 mmol/L   Glucose, Bld 99 65 - 99 mg/dL   BUN 66 (H) 6 - 20 mg/dL   Creatinine, Ser 5.00 (H) 0.61 - 1.24 mg/dL   Calcium 8.8 (L) 8.9 - 10.3 mg/dL   Total Protein 6.3 (L) 6.5 - 8.1 g/dL   Albumin 3.1 (L) 3.5 - 5.0 g/dL   AST 17 15 - 41 U/L   ALT 11 (L) 17 - 63 U/L   Alkaline Phosphatase 45 38 - 126 U/L   Total Bilirubin 0.6 0.3 - 1.2 mg/dL   GFR calc non Af Amer 11 (L) >60 mL/min   GFR calc Af Amer 13 (L) >60 mL/min    Comment: (NOTE) The eGFR has been calculated using the CKD EPI equation. This calculation has not been validated in all clinical situations. eGFR's persistently <60 mL/min signify possible Chronic Kidney Disease.    Anion gap 12 5 - 15  CBC     Status: Abnormal   Collection Time: 09/14/15  5:58 AM  Result Value Ref Range   WBC 10.7 (H) 4.0 - 10.5 K/uL   RBC 2.46 (L) 4.22 - 5.81 MIL/uL   Hemoglobin 8.2 (L) 13.0 - 17.0 g/dL   HCT 26.0 (L) 39.0 - 52.0 %   MCV 105.7 (H) 78.0 - 100.0 fL   MCH 33.3 26.0 - 34.0 pg  MCHC 31.5 30.0 - 36.0 g/dL   RDW 14.4 11.5 - 15.5 %   Platelets 123 (L) 150 - 400 K/uL  Glucose, capillary     Status: None   Collection Time: 09/14/15  7:36 AM  Result Value Ref Range   Glucose-Capillary 90 65 - 99 mg/dL  Glucose, capillary     Status: None   Collection Time: 09/14/15 11:25 AM  Result Value Ref  Range   Glucose-Capillary 86 65 - 99 mg/dL  Glucose, capillary     Status: None   Collection Time: 09/14/15  4:24 PM  Result Value Ref Range   Glucose-Capillary 92 65 - 99 mg/dL  Glucose, capillary     Status: None   Collection Time: 09/14/15  8:14 PM  Result Value Ref Range   Glucose-Capillary 93 65 - 99 mg/dL  Glucose, capillary     Status: Abnormal   Collection Time: 09/15/15  8:11 AM  Result Value Ref Range   Glucose-Capillary 131 (H) 65 - 99 mg/dL  Basic metabolic panel     Status: Abnormal   Collection Time: 09/15/15  9:19 AM  Result Value Ref Range   Sodium 141 135 - 145 mmol/L   Potassium 4.2 3.5 - 5.1 mmol/L   Chloride 111 101 - 111 mmol/L   CO2 21 (L) 22 - 32 mmol/L   Glucose, Bld 120 (H) 65 - 99 mg/dL   BUN 66 (H) 6 - 20 mg/dL   Creatinine, Ser 5.24 (H) 0.61 - 1.24 mg/dL   Calcium 8.5 (L) 8.9 - 10.3 mg/dL   GFR calc non Af Amer 10 (L) >60 mL/min   GFR calc Af Amer 12 (L) >60 mL/min    Comment: (NOTE) The eGFR has been calculated using the CKD EPI equation. This calculation has not been validated in all clinical situations. eGFR's persistently <60 mL/min signify possible Chronic Kidney Disease.    Anion gap 9 5 - 15    ABGS No results for input(s): PHART, PO2ART, TCO2, HCO3 in the last 72 hours.  Invalid input(s): PCO2 CULTURES No results found for this or any previous visit (from the past 240 hour(s)). Studies/Results: Ct Abdomen Pelvis Wo Contrast  09/13/2015  CLINICAL DATA:  Acute onset of vomiting for 3 days. Initial encounter. EXAM: CT ABDOMEN AND PELVIS WITHOUT CONTRAST TECHNIQUE: Multidetector CT imaging of the abdomen and pelvis was performed following the standard protocol without IV contrast. COMPARISON:  CT of the abdomen and pelvis from 07/08/2015 FINDINGS: Minimal bibasilar atelectasis is noted. Trace pericardial fluid remains within normal limits. Diffuse coronary artery calcifications are seen. The liver and spleen are unremarkable in appearance.  The gallbladder is within normal limits. The pancreas and adrenal glands are unremarkable. There is minimal right-sided hydronephrosis, with distention of the right renal pelvis. Mildly asymmetric right perinephric stranding is noted, with wall thickening along the course of the right ureter. No distal obstructing stone is seen. This raises concern for acute right-sided ureteritis and pyelonephritis. A 2.6 cm cyst is noted at the interpole region of the left kidney. There is slight prominence of the left ureter. No free fluid is identified. The small bowel is unremarkable in appearance. The stomach is within normal limits. No acute vascular abnormalities are seen. Minimal calcification is noted along the distal abdominal aorta and its branches. The appendix is normal in caliber, without evidence of appendicitis. The colon is unremarkable in appearance. The bladder is diffusely thick walled, concerning for cystitis, though some of this may reflect chronic inflammation.  The prostate is enlarged, measuring 5.5 cm in transverse dimension. No inguinal lymphadenopathy is seen. No acute osseous abnormalities are identified. IMPRESSION: 1. Minimal right-sided hydronephrosis, with distention of the right renal pelvis. Mildly asymmetric right perinephric stranding and right ureteral wall thickening. This raises concern for acute right-sided ureteritis and pyelonephritis. No distal obstructing stone seen. 2. Diffusely thick-walled bladder raises question for cystitis, though some of this may reflect chronic inflammation. 3. Left renal cyst noted. Slight prominence of the left ureter, without additional abnormality on the left side. 4. Diffuse coronary artery calcifications seen. 5. Enlarged prostate seen. Electronically Signed   By: Garald Balding M.D.   On: 09/13/2015 19:29   Dg Chest 2 View  09/13/2015  CLINICAL DATA:  Vomiting for 3 days EXAM: CHEST - 2 VIEW COMPARISON:  None. FINDINGS: The heart size and mediastinal  contours are within normal limits. Both lungs are clear. The visualized skeletal structures are unremarkable. IMPRESSION: No active disease. Electronically Signed   By: Inez Catalina M.D.   On: 09/13/2015 17:39    Medications:  Prior to Admission:  Prescriptions prior to admission  Medication Sig Dispense Refill Last Dose  . carvedilol (COREG) 6.25 MG tablet Take 1 tablet (6.25 mg total) by mouth 2 (two) times daily with a meal. 60 tablet 3 Past Week at Unknown time  . ferrous sulfate 325 (65 FE) MG tablet Take 325 mg by mouth daily with breakfast.   Past Week at Unknown time  . HYDROcodone-acetaminophen (NORCO/VICODIN) 5-325 MG per tablet Take 1 tablet by mouth every 6 (six) hours as needed for moderate pain. 30 tablet 0 Past Week at Unknown time  . isosorbide-hydrALAZINE (BIDIL) 20-37.5 MG per tablet Take 1 tablet by mouth 3 (three) times daily. (Patient taking differently: Take 1 tablet by mouth 3 (three) times daily. 8am,2pm,8pm) 90 tablet 3 Past Week at Unknown time  . levothyroxine (SYNTHROID, LEVOTHROID) 75 MCG tablet Take 75 mcg by mouth daily before breakfast.    Past Week at Unknown time  . linagliptin (TRADJENTA) 5 MG TABS tablet Take 5 mg by mouth daily.   Past Week at Unknown time  . niacin (NIASPAN) 1000 MG CR tablet Take 1,000 mg by mouth at bedtime.   Past Week at Unknown time  . nitrofurantoin (MACRODANTIN) 100 MG capsule Take 100 mg by mouth 2 (two) times daily.    Past Week at Unknown time  . omeprazole (PRILOSEC) 20 MG capsule Take 40 mg by mouth daily.   Past Week at Unknown time  . simvastatin (ZOCOR) 20 MG tablet Take 20 mg by mouth at bedtime.    Past Week at Unknown time  . sodium polystyrene (KAYEXALATE) 15 GM/60ML suspension Take 30 g by mouth. Daily on Monday and Friday   Past Week at Unknown time  . tamsulosin (FLOMAX) 0.4 MG CAPS capsule Take 0.4 mg by mouth daily.   Past Week at Unknown time  . torsemide (DEMADEX) 20 MG tablet Take 20 mg by mouth daily. Daily at 8am    Past Week at Unknown time  . Vitamin D, Ergocalciferol, (DRISDOL) 50000 UNITS CAPS capsule Take 1 capsule (50,000 Units total) by mouth every 7 (seven) days. (Patient taking differently: Take 50,000 Units by mouth every 7 (seven) days. On Monday) 30 capsule 3 Past Week at Unknown time  . ciprofloxacin (CIPRO) 500 MG tablet Take 1 tablet (500 mg total) by mouth daily. (Patient not taking: Reported on 09/13/2015) 5 tablet 0 Taking   Scheduled: . carvedilol  6.25 mg Oral BID WC  . cefTRIAXone (ROCEPHIN)  IV  1 g Intravenous Q24H  . enoxaparin (LOVENOX) injection  30 mg Subcutaneous Q24H  . ferrous sulfate  325 mg Oral Q breakfast  . insulin aspart  0-5 Units Subcutaneous QHS  . insulin aspart  0-9 Units Subcutaneous TID WC  . isosorbide-hydrALAZINE  1 tablet Oral TID  . levothyroxine  75 mcg Oral QAC breakfast  . linagliptin  5 mg Oral Daily  . niacin  1,000 mg Oral QHS  . pantoprazole  40 mg Oral Daily  . tamsulosin  0.4 mg Oral Daily   Continuous: . sodium chloride 100 mL/hr at 09/15/15 0400   JKD:[id number], ondansetron **OR** ondansetron (ZOFRAN) IV  Assesment: He was admitted with pyelonephritis and acute on chronic renal failure. His renal function is worse this morning. He appears to have some obstructive uropathy.  He has hypertension and his blood pressure is pretty well controlled.  He has diabetes and that's not controlled previously but looks a little better  He has mental retardation at baseline that is stable Active Problems:   DM (diabetes mellitus), type 2, uncontrolled ([place])   Mental retardation   Anemia, chronic disease   Acute renal failure superimposed on stage 4 chronic kidney disease ([facility])   Essential hypertension   Pyelonephritis    Plan: Nephrology consultation noted and appreciated. Continue antibiotics. Continue other treatments. Repeat laboratory work in the morning    LOS: 2 days   Heron Pitcock L 09/15/2015, 10:45 AM

## 2015-09-15 NOTE — Consult Note (Signed)
Reason for Consult: Worsening of renal failure Referring Physician: [name] is an 69 y.o. male.  HPI: He is a patient who has history of diabetes, hypertension, obstructive uropathy presently came with nausea and also vomiting. When he was evaluated he was found to have right sided hydronephrosis and possibly pyelonephritis. His renal function also has declined since consult is called. Patient had recurrent problem of obstructive uropathy with Foley catheter placement. Presently patient denies any difficulty in breathing. His appetite is good  Past Medical History  Diagnosis Date  . Hypertension   . Diabetes mellitus   . Smoking   . Abnormal CT scan, kidney 10/06/2011  . UTI (lower urinary tract infection) 10/06/2011  . Bladder wall thickening 10/06/2011  . 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 Poor historian poor historian  . BPH (benign prostatic hypertrophy)   . Anxiety     mental retardation  . Hypothyroidism   . MR (mental retardation)   . Acute pyelonephritis 10/07/2011  . Hyperkalemia   . Renal insufficiency     chronic history  . Renal failure (ARF), acute on chronic (HCC)   . Anemia     normocytic  . Edema      history of lower extremity edema  . Hyperlipidemia   . Impaired speech     Past Surgical History  Procedure Laterality Date  . Cystoscopy with urethral dilatation N/A 12/29/2013    Procedure: CYSTOSCOPY WITH URETHRAL DILATATION;  Surgeon: Marissa Nestle, MD;  Location: [facility];  Service: Urology;  Laterality: N/A;  . Transurethral resection of prostate N/A 01/04/2014    Procedure: TRANSURETHRAL RESECTION OF THE PROSTATE (TURP) (procedure #2);  Surgeon: Marissa Nestle, MD;  Location: [facility];  Service: Urology;  Laterality: N/A;  . Circumcision N/A 01/04/2014    Procedure: CIRCUMCISION ADULT (procedure #1);  Surgeon: Marissa Nestle, MD;  Location: [facility];  Service: Urology;  Laterality: N/A;  . Cystoscopy w/ retrogrades Bilateral 06/29/2015     Procedure: CYSTOSCOPY, DILATION OF URETHRAL STRICTURE WITH BILATERAL RETROGRADE PYELOGRAM,SUPRAPUBIC TUBE CHANGE;  Surgeon: Festus Aloe, MD;  Location: [facility];  Service: Urology;  Laterality: Bilateral;  . Av fistula placement Left 07/06/2015    Procedure:  INSERTION LEFT ARM ARTERIOVENOUS GORTEX GRAFT;  Surgeon: Angelia Mould, MD;  Location: [facility] OR;  Service: Vascular;  Laterality: Left;    Family History  Problem Relation Age of Onset  . Cancer Mother     Social History:  reports that he has never smoked. He has never used smokeless tobacco. He reports that he does not drink alcohol or use illicit drugs.  Allergies: No Known Allergies  Medications: I have reviewed the patient's current medications.  Results for orders placed or performed during the hospital encounter of 09/13/15 (from the past 48 hour(s))  CBG monitoring, ED     Status: Abnormal   Collection Time: 09/13/15  2:08 PM  Result Value Ref Range   Glucose-Capillary 134 (H) 65 - 99 mg/dL  Urinalysis, Routine w reflex microscopic (not at [facility])     Status: Abnormal   Collection Time: 09/13/15  2:10 PM  Result Value Ref Range   Color, Urine YELLOW YELLOW   APPearance CLEAR CLEAR   Specific Gravity, Urine 1.025 1.005 - 1.030   pH 6.0 5.0 - 8.0   Glucose, UA NEGATIVE NEGATIVE mg/dL   Hgb urine dipstick TRACE (A) NEGATIVE   Bilirubin Urine NEGATIVE NEGATIVE   Ketones, ur NEGATIVE NEGATIVE mg/dL   Protein,  ur 100 (A) NEGATIVE mg/dL   Nitrite NEGATIVE NEGATIVE   Leukocytes, UA TRACE (A) NEGATIVE  Urine microscopic-add on     Status: Abnormal   Collection Time: 09/13/15  2:10 PM  Result Value Ref Range   Squamous Epithelial / LPF 0-5 (A) NONE SEEN    Comment: Please note change in reference range.   WBC, UA TOO NUMEROUS TO COUNT 0 - 5 WBC/hpf    Comment: Please note change in reference range.   RBC / HPF 0-5 0 - 5 RBC/hpf    Comment: Please note change in reference range.   Bacteria, UA FEW (A) NONE SEEN     Comment: Please note change in reference range.  CBC with Differential     Status: Abnormal   Collection Time: 09/13/15  2:42 PM  Result Value Ref Range   WBC 12.0 (H) 4.0 - 10.5 K/uL   RBC 2.46 (L) 4.22 - 5.81 MIL/uL   Hemoglobin 8.2 (L) 13.0 - 17.0 g/dL   HCT 26.1 (L) 39.0 - 52.0 %   MCV 106.1 (H) 78.0 - 100.0 fL   MCH 33.3 26.0 - 34.0 pg   MCHC 31.4 30.0 - 36.0 g/dL   RDW 14.8 11.5 - 15.5 %   Platelets 132 (L) 150 - 400 K/uL   Neutrophils Relative % 82 %   Neutro Abs 9.8 (H) 1.7 - 7.7 K/uL   Lymphocytes Relative 7 %   Lymphs Abs 0.8 0.7 - 4.0 K/uL   Monocytes Relative 11 %   Monocytes Absolute 1.4 (H) 0.1 - 1.0 K/uL   Eosinophils Relative 0 %   Eosinophils Absolute 0.0 0.0 - 0.7 K/uL   Basophils Relative 0 %   Basophils Absolute 0.0 0.0 - 0.1 K/uL  Comprehensive metabolic panel     Status: Abnormal   Collection Time: 09/13/15  2:42 PM  Result Value Ref Range   Sodium 144 135 - 145 mmol/L   Potassium 4.5 3.5 - 5.1 mmol/L   Chloride 111 101 - 111 mmol/L   CO2 24 22 - 32 mmol/L   Glucose, Bld 150 (H) 65 - 99 mg/dL   BUN 56 (H) 6 - 20 mg/dL   Creatinine, Ser 4.76 (H) 0.61 - 1.24 mg/dL   Calcium 9.2 8.9 - 10.3 mg/dL   Total Protein 6.8 6.5 - 8.1 g/dL   Albumin 3.4 (L) 3.5 - 5.0 g/dL   AST 18 15 - 41 U/L   ALT 12 (L) 17 - 63 U/L   Alkaline Phosphatase 47 38 - 126 U/L   Total Bilirubin 0.6 0.3 - 1.2 mg/dL   GFR calc non Af Amer 11 (L) >60 mL/min   GFR calc Af Amer 13 (L) >60 mL/min    Comment: (NOTE) The eGFR has been calculated using the CKD EPI equation. This calculation has not been validated in all clinical situations. eGFR's persistently <60 mL/min signify possible Chronic Kidney Disease.    Anion gap 9 5 - 15  Troponin I     Status: None   Collection Time: 09/13/15  2:42 PM  Result Value Ref Range   Troponin I <0.03 <0.031 ng/mL    Comment:        NO INDICATION OF MYOCARDIAL INJURY.   Lipase, blood     Status: None   Collection Time: 09/13/15  2:42 PM   Result Value Ref Range   Lipase 34 11 - 51 U/L  Glucose, capillary     Status: Abnormal   Collection Time: 09/13/15  10:23 PM  Result Value Ref Range   Glucose-Capillary 108 (H) 65 - 99 mg/dL   Comment 1 Notify RN    Comment 2 Document in Chart   Comprehensive metabolic panel     Status: Abnormal   Collection Time: 09/14/15  5:58 AM  Result Value Ref Range   Sodium 146 (H) 135 - 145 mmol/L   Potassium 4.4 3.5 - 5.1 mmol/L   Chloride 113 (H) 101 - 111 mmol/L   CO2 21 (L) 22 - 32 mmol/L   Glucose, Bld 99 65 - 99 mg/dL   BUN 66 (H) 6 - 20 mg/dL   Creatinine, Ser 5.00 (H) 0.61 - 1.24 mg/dL   Calcium 8.8 (L) 8.9 - 10.3 mg/dL   Total Protein 6.3 (L) 6.5 - 8.1 g/dL   Albumin 3.1 (L) 3.5 - 5.0 g/dL   AST 17 15 - 41 U/L   ALT 11 (L) 17 - 63 U/L   Alkaline Phosphatase 45 38 - 126 U/L   Total Bilirubin 0.6 0.3 - 1.2 mg/dL   GFR calc non Af Amer 11 (L) >60 mL/min   GFR calc Af Amer 13 (L) >60 mL/min    Comment: (NOTE) The eGFR has been calculated using the CKD EPI equation. This calculation has not been validated in all clinical situations. eGFR's persistently <60 mL/min signify possible Chronic Kidney Disease.    Anion gap 12 5 - 15  CBC     Status: Abnormal   Collection Time: 09/14/15  5:58 AM  Result Value Ref Range   WBC 10.7 (H) 4.0 - 10.5 K/uL   RBC 2.46 (L) 4.22 - 5.81 MIL/uL   Hemoglobin 8.2 (L) 13.0 - 17.0 g/dL   HCT 26.0 (L) 39.0 - 52.0 %   MCV 105.7 (H) 78.0 - 100.0 fL   MCH 33.3 26.0 - 34.0 pg   MCHC 31.5 30.0 - 36.0 g/dL   RDW 14.4 11.5 - 15.5 %   Platelets 123 (L) 150 - 400 K/uL  Glucose, capillary     Status: None   Collection Time: 09/14/15  7:36 AM  Result Value Ref Range   Glucose-Capillary 90 65 - 99 mg/dL  Glucose, capillary     Status: None   Collection Time: 09/14/15 11:25 AM  Result Value Ref Range   Glucose-Capillary 86 65 - 99 mg/dL  Glucose, capillary     Status: None   Collection Time: 09/14/15  4:24 PM  Result Value Ref Range    Glucose-Capillary 92 65 - 99 mg/dL  Glucose, capillary     Status: None   Collection Time: 09/14/15  8:14 PM  Result Value Ref Range   Glucose-Capillary 93 65 - 99 mg/dL  Glucose, capillary     Status: Abnormal   Collection Time: 09/15/15  8:11 AM  Result Value Ref Range   Glucose-Capillary 131 (H) 65 - 99 mg/dL    Ct Abdomen Pelvis Wo Contrast  09/13/2015  CLINICAL DATA:  Acute onset of vomiting for 3 days. Initial encounter. EXAM: CT ABDOMEN AND PELVIS WITHOUT CONTRAST TECHNIQUE: Multidetector CT imaging of the abdomen and pelvis was performed following the standard protocol without IV contrast. COMPARISON:  CT of the abdomen and pelvis from 07/08/2015 FINDINGS: Minimal bibasilar atelectasis is noted. Trace pericardial fluid remains within normal limits. Diffuse coronary artery calcifications are seen. The liver and spleen are unremarkable in appearance. The gallbladder is within normal limits. The pancreas and adrenal glands are unremarkable. There is minimal right-sided hydronephrosis, with distention of  the right renal pelvis. Mildly asymmetric right perinephric stranding is noted, with wall thickening along the course of the right ureter. No distal obstructing stone is seen. This raises concern for acute right-sided ureteritis and pyelonephritis. A 2.6 cm cyst is noted at the interpole region of the left kidney. There is slight prominence of the left ureter. No free fluid is identified. The small bowel is unremarkable in appearance. The stomach is within normal limits. No acute vascular abnormalities are seen. Minimal calcification is noted along the distal abdominal aorta and its branches. The appendix is normal in caliber, without evidence of appendicitis. The colon is unremarkable in appearance. The bladder is diffusely thick walled, concerning for cystitis, though some of this may reflect chronic inflammation. The prostate is enlarged, measuring 5.5 cm in transverse dimension. No inguinal  lymphadenopathy is seen. No acute osseous abnormalities are identified. IMPRESSION: 1. Minimal right-sided hydronephrosis, with distention of the right renal pelvis. Mildly asymmetric right perinephric stranding and right ureteral wall thickening. This raises concern for acute right-sided ureteritis and pyelonephritis. No distal obstructing stone seen. 2. Diffusely thick-walled bladder raises question for cystitis, though some of this may reflect chronic inflammation. 3. Left renal cyst noted. Slight prominence of the left ureter, without additional abnormality on the left side. 4. Diffuse coronary artery calcifications seen. 5. Enlarged prostate seen. Electronically Signed   By: Garald Balding M.D.   On: 09/13/2015 19:29   Dg Chest 2 View  09/13/2015  CLINICAL DATA:  Vomiting for 3 days EXAM: CHEST - 2 VIEW COMPARISON:  None. FINDINGS: The heart size and mediastinal contours are within normal limits. Both lungs are clear. The visualized skeletal structures are unremarkable. IMPRESSION: No active disease. Electronically Signed   By: Inez Catalina M.D.   On: 09/13/2015 17:39    Review of Systems  Constitutional: Positive for malaise/fatigue.  Respiratory: Positive for shortness of breath.   Gastrointestinal: Positive for nausea and vomiting. Negative for abdominal pain and diarrhea.   Blood pressure 147/71, pulse 100, temperature 98 F (36.7 C), temperature source Oral, resp. rate 16, height _0  (1.727 m), weight 180 lb 3.2 oz (81.738 kg), SpO2 100 %. Physical Exam  Constitutional: No distress.  Eyes: No scleral icterus.  Neck: No JVD present.  Cardiovascular: Normal rate and regular rhythm.   Respiratory: No respiratory distress. He has no wheezes.  GI: He exhibits no distension. There is no tenderness.  Musculoskeletal: He exhibits no edema.    Assessment/Plan: Problem #1 acute kidney injury superimposed on chronic. Possibly secondary to obstructive uropathy. His BUN and creatinine is  higher than his baseline. Presently his non-oliguric with 1200 mL of urine output since he came. Problem #2 chronic renal failure: Stage IV. Patient has left upper arm graft. Etiology was thought to be secondary to obstructive uropathy/diabetes/hypertension. Problem #3 possible pyelonephritis. Presently he is on antibiotics. Patient is a febrile and his white blood cell count is improving. Problem #4 anemia: His hemoglobin is below our target goal. Possibly a combination of iron deficiency anemia and anemia of chronic disease. Problem #5 metabolic bone disease: His calcium is range Problem #6 hypertension: His blood pressure is reasonable controlled Problem #7 diabetes Problem #8 mental retardation Plan: We'll increase his IV fluid to 125 mL per hour 2] will check basic metabolic panel and phosphorus in the morning 3] we check his iron studies.  Rickardo Brinegar S 09/15/2015, 9:39 AM

## 2015-09-16 LAB — GLUCOSE, CAPILLARY
Glucose-Capillary: 137 mg/dL — ABNORMAL HIGH (ref 65–99)
Glucose-Capillary: 111 mg/dL — ABNORMAL HIGH (ref 65–99)
Glucose-Capillary: 135 mg/dL — ABNORMAL HIGH (ref 65–99)
Glucose-Capillary: 101 mg/dL — ABNORMAL HIGH (ref 65–99)

## 2015-09-16 LAB — BASIC METABOLIC PANEL
Anion gap: 7 (ref 5–15)
BUN: 51 mg/dL — ABNORMAL HIGH (ref 6–20)
CO2: 19 mmol/L — ABNORMAL LOW (ref 22–32)
Calcium: 8.3 mg/dL — ABNORMAL LOW (ref 8.9–10.3)
Chloride: 117 mmol/L — ABNORMAL HIGH (ref 101–111)
Creatinine, Ser: 3.85 mg/dL — ABNORMAL HIGH (ref 0.61–1.24)
GFR calc Af Amer: 17 mL/min — ABNORMAL LOW (ref >60)
GFR calc non Af Amer: 15 mL/min — ABNORMAL LOW (ref >60)
Glucose, Bld: 119 mg/dL — ABNORMAL HIGH (ref 65–99)
Potassium: 4.4 mmol/L (ref 3.5–5.1)
Sodium: 143 mmol/L (ref 135–145)

## 2015-09-16 LAB — IRON AND TIBC
Iron: 14 ug/dL — ABNORMAL LOW (ref 45–182)
Saturation Ratios: 8 % — ABNORMAL LOW (ref 17.9–39.5)
TIBC: 181 ug/dL — ABNORMAL LOW (ref 250–450)
UIBC: 167 ug/dL

## 2015-09-16 LAB — PHOSPHORUS: Phosphorus: 3.1 mg/dL (ref 2.5–4.6)

## 2015-09-16 LAB — FERRITIN: Ferritin: 83 ng/mL (ref 24–336)

## 2015-09-16 NOTE — Progress Notes (Signed)
Subjective: Patient presently off with no complaints. He denies any difficulty in breathing. Patient does not have any nausea or vomiting.   Objective: Vital signs in last 24 hours: Temp:  [97.5 F (36.4 C)-98 F (36.7 C)] 97.5 F (36.4 C) (11/20 0537) Pulse Rate:  [85-95] 95 (11/20 0537) Resp:  [18-20] 20 (11/20 0537) BP: (130-148)/(63-81) 145/70 mmHg (11/20 0537) SpO2:  [97 %-99 %] 97 % (11/20 0537)  Intake/Output from previous day: 11/19 0701 - 11/20 0700 In: 1927 [P.O.:600; I.V.:1327] Out: 1525 [Urine:1525] Intake/Output this shift: Total I/O In: -  Out: 350 [Urine:350]   Recent Labs  09/13/15 1442 09/14/15 0558  HGB 8.2* 8.2*    Recent Labs  09/13/15 1442 09/14/15 0558  WBC 12.0* 10.7*  RBC 2.46* 2.46*  HCT 26.1* 26.0*  PLT 132* 123*    Recent Labs  09/15/15 0919 09/16/15 0548  NA 141 143  K 4.2 4.4  CL 111 117*  CO2 21* 19*  BUN 66* 51*  CREATININE 5.24* 3.85*  GLUCOSE 120* 119*  CALCIUM 8.5* 8.3*   No results for input(s): LABPT, INR in the last 72 hours.  Generally patient is alert and/or no apparent distress Chest is clear to auscultation His heart exam regular rate and rhythm no murmur Extremities no edema  Assessment/Plan: Problem #1 acute kidney injury: Presently his pending creatinine is improving. Patient is non-oliguric at 1500 mL of urine output. His potassium is normal. Problem #2 anemia: This could be accomplished of iron deficiency and anemia of chronic disease. Patient is on by mouth iron. His iron saturation presently is pending. His hemoglobin however is below target goal. Problem #3 history of pyelonephritis: Patient is on antibiotics. He is a febrile. Problem #4 history of mental retardation Problem #5 diabetes: His blood sugar is reasonably controlled Problem #6 hypertension. His blood pressure is within acceptable range Problem #7 history of obstructive uropathy: Patient with Foley catheter. He has right hydronephrosis  during his initial evaluation. Problem #8 metabolic bone disease: His calcium and phosphorus is in range. Presently patient is not on any binder. Plan: We'll continue with present management We'll check his basic metabolic panel and CBC in the morning.   Zachary [name] S 09/16/2015, 9:42 AM

## 2015-09-16 NOTE — Progress Notes (Signed)
Subjective: He says he feels better. No complaints of pain nausea or vomiting.  Objective: Vital signs in last 24 hours: Temp:  [97.5 F (36.4 C)-98 F (36.7 C)] 97.5 F (36.4 C) (11/20 0537) Pulse Rate:  [85-95] 95 (11/20 0537) Resp:  [18-20] 20 (11/20 0537) BP: (130-148)/(63-81) 145/70 mmHg (11/20 0537) SpO2:  [97 %-99 %] 97 % (11/20 0537) Weight change:  Last BM Date: 09/13/15  Intake/Output from previous day: 11/19 0701 - 11/20 0700 In: 1927 [P.O.:600; I.V.:1327] Out: 1525 [Urine:1525]  PHYSICAL EXAM General appearance: alert, cooperative and no distress Resp: clear to auscultation bilaterally Cardio: regular rate and rhythm, S1, S2 normal, no murmur, click, rub or gallop GI: soft, non-tender; bowel sounds normal; no masses,  no organomegaly Extremities: extremities normal, atraumatic, no cyanosis or edema  Lab Results:  Results for orders placed or performed during the hospital encounter of 09/13/15 (from the past 48 hour(s))  Glucose, capillary     Status: None   Collection Time: 09/14/15 11:25 AM  Result Value Ref Range   Glucose-Capillary 86 65 - 99 mg/dL  Glucose, capillary     Status: None   Collection Time: 09/14/15  4:24 PM  Result Value Ref Range   Glucose-Capillary 92 65 - 99 mg/dL  Glucose, capillary     Status: None   Collection Time: 09/14/15  8:14 PM  Result Value Ref Range   Glucose-Capillary 93 65 - 99 mg/dL  Glucose, capillary     Status: Abnormal   Collection Time: 09/15/15  8:11 AM  Result Value Ref Range   Glucose-Capillary 131 (H) 65 - 99 mg/dL  Basic metabolic panel     Status: Abnormal   Collection Time: 09/15/15  9:19 AM  Result Value Ref Range   Sodium 141 135 - 145 mmol/L   Potassium 4.2 3.5 - 5.1 mmol/L   Chloride 111 101 - 111 mmol/L   CO2 21 (L) 22 - 32 mmol/L   Glucose, Bld 120 (H) 65 - 99 mg/dL   BUN 66 (H) 6 - 20 mg/dL   Creatinine, Ser 5.24 (H) 0.61 - 1.24 mg/dL   Calcium 8.5 (L) 8.9 - 10.3 mg/dL   GFR calc non Af Amer 10  (L) >60 mL/min   GFR calc Af Amer 12 (L) >60 mL/min    Comment: (NOTE) The eGFR has been calculated using the CKD EPI equation. This calculation has not been validated in all clinical situations. eGFR's persistently <60 mL/min signify possible Chronic Kidney Disease.    Anion gap 9 5 - 15  Glucose, capillary     Status: Abnormal   Collection Time: 09/15/15 11:54 AM  Result Value Ref Range   Glucose-Capillary 125 (H) 65 - 99 mg/dL  Glucose, capillary     Status: Abnormal   Collection Time: 09/15/15  4:51 PM  Result Value Ref Range   Glucose-Capillary 136 (H) 65 - 99 mg/dL  Glucose, capillary     Status: Abnormal   Collection Time: 09/15/15  9:47 PM  Result Value Ref Range   Glucose-Capillary 122 (H) 65 - 99 mg/dL  Phosphorus     Status: None   Collection Time: 09/16/15  5:48 AM  Result Value Ref Range   Phosphorus 3.1 2.5 - 4.6 mg/dL  Basic metabolic panel     Status: Abnormal   Collection Time: 09/16/15  5:48 AM  Result Value Ref Range   Sodium 143 135 - 145 mmol/L   Potassium 4.4 3.5 - 5.1 mmol/L   Chloride 117 (  H) 101 - 111 mmol/L   CO2 19 (L) 22 - 32 mmol/L   Glucose, Bld 119 (H) 65 - 99 mg/dL   BUN 51 (H) 6 - 20 mg/dL   Creatinine, Ser 3.85 (H) 0.61 - 1.24 mg/dL   Calcium 8.3 (L) 8.9 - 10.3 mg/dL   GFR calc non Af Amer 15 (L) >60 mL/min   GFR calc Af Amer 17 (L) >60 mL/min    Comment: (NOTE) The eGFR has been calculated using the CKD EPI equation. This calculation has not been validated in all clinical situations. eGFR's persistently <60 mL/min signify possible Chronic Kidney Disease.    Anion gap 7 5 - 15  Glucose, capillary     Status: Abnormal   Collection Time: 09/16/15  7:54 AM  Result Value Ref Range   Glucose-Capillary 137 (H) 65 - 99 mg/dL    ABGS No results for input(s): PHART, PO2ART, TCO2, HCO3 in the last 72 hours.  Invalid input(s): PCO2 CULTURES Recent Results (from the past 240 hour(s))  Urine culture     Status: None   Collection Time:  09/13/15  2:31 PM  Result Value Ref Range Status   Specimen Description URINE, CLEAN CATCH  Final   Special Requests NONE  Final   Culture   Final    NO GROWTH 1 DAY Performed at [facility]    Report Status 09/15/2015 FINAL  Final   Studies/Results: No results found.  Medications:  Prior to Admission:  Prescriptions prior to admission  Medication Sig Dispense Refill Last Dose  . carvedilol (COREG) 6.25 MG tablet Take 1 tablet (6.25 mg total) by mouth 2 (two) times daily with a meal. 60 tablet 3 Past Week at Unknown time  . ferrous sulfate 325 (65 FE) MG tablet Take 325 mg by mouth daily with breakfast.   Past Week at Unknown time  . HYDROcodone-acetaminophen (NORCO/VICODIN) 5-325 MG per tablet Take 1 tablet by mouth every 6 (six) hours as needed for moderate pain. 30 tablet 0 Past Week at Unknown time  . isosorbide-hydrALAZINE (BIDIL) 20-37.5 MG per tablet Take 1 tablet by mouth 3 (three) times daily. (Patient taking differently: Take 1 tablet by mouth 3 (three) times daily. 8am,2pm,8pm) 90 tablet 3 Past Week at Unknown time  . levothyroxine (SYNTHROID, LEVOTHROID) 75 MCG tablet Take 75 mcg by mouth daily before breakfast.    Past Week at Unknown time  . linagliptin (TRADJENTA) 5 MG TABS tablet Take 5 mg by mouth daily.   Past Week at Unknown time  . niacin (NIASPAN) 1000 MG CR tablet Take 1,000 mg by mouth at bedtime.   Past Week at Unknown time  . nitrofurantoin (MACRODANTIN) 100 MG capsule Take 100 mg by mouth 2 (two) times daily.    Past Week at Unknown time  . omeprazole (PRILOSEC) 20 MG capsule Take 40 mg by mouth daily.   Past Week at Unknown time  . simvastatin (ZOCOR) 20 MG tablet Take 20 mg by mouth at bedtime.    Past Week at Unknown time  . sodium polystyrene (KAYEXALATE) 15 GM/60ML suspension Take 30 g by mouth. Daily on Monday and Friday   Past Week at Unknown time  . tamsulosin (FLOMAX) 0.4 MG CAPS capsule Take 0.4 mg by mouth daily.   Past Week at Unknown time  .  torsemide (DEMADEX) 20 MG tablet Take 20 mg by mouth daily. Daily at 8am   Past Week at Unknown time  . Vitamin D, Ergocalciferol, (DRISDOL) 50000 UNITS  CAPS capsule Take 1 capsule (50,000 Units total) by mouth every 7 (seven) days. (Patient taking differently: Take 50,000 Units by mouth every 7 (seven) days. On Monday) 30 capsule 3 Past Week at Unknown time  . ciprofloxacin (CIPRO) 500 MG tablet Take 1 tablet (500 mg total) by mouth daily. (Patient not taking: Reported on 09/13/2015) 5 tablet 0 Taking   Scheduled: . carvedilol  6.25 mg Oral BID WC  . cefTRIAXone (ROCEPHIN)  IV  1 g Intravenous Q24H  . enoxaparin (LOVENOX) injection  30 mg Subcutaneous Q24H  . ferrous sulfate  325 mg Oral Q breakfast  . insulin aspart  0-5 Units Subcutaneous QHS  . insulin aspart  0-9 Units Subcutaneous TID WC  . isosorbide-hydrALAZINE  1 tablet Oral TID  . levothyroxine  75 mcg Oral QAC breakfast  . linagliptin  5 mg Oral Daily  . niacin  1,000 mg Oral QHS  . pantoprazole  40 mg Oral Daily  . tamsulosin  0.4 mg Oral Daily   Continuous: . sodium chloride 125 mL/hr at 09/16/15 5797   KQA:[id number], ondansetron **OR** ondansetron (ZOFRAN) IV  Assesment: He was admitted with pyelonephritis and acute on chronic renal failure. He seems to be generally improving. His renal function is much improved. He has no other complaints. His blood sugar is better. He has hypertension which is pretty well controlled. At baseline he has mental retardation which is of course unchanged Active Problems:   DM (diabetes mellitus), type 2, uncontrolled ([place])   Mental retardation   Anemia, chronic disease   Acute renal failure superimposed on stage 4 chronic kidney disease ([place])   Essential hypertension   Pyelonephritis    Plan: Continue current treatments.    LOS: 3 days   Marleena Shubert L 09/16/2015, 10:24 AM

## 2015-09-17 LAB — GLUCOSE, CAPILLARY
Glucose-Capillary: 94 mg/dL (ref 65–99)
Glucose-Capillary: 117 mg/dL — ABNORMAL HIGH (ref 65–99)
Glucose-Capillary: 101 mg/dL — ABNORMAL HIGH (ref 65–99)
Glucose-Capillary: 128 mg/dL — ABNORMAL HIGH (ref 65–99)

## 2015-09-17 LAB — BASIC METABOLIC PANEL
Anion gap: 3 — ABNORMAL LOW (ref 5–15)
BUN: 35 mg/dL — ABNORMAL HIGH (ref 6–20)
CO2: 22 mmol/L (ref 22–32)
Calcium: 8.4 mg/dL — ABNORMAL LOW (ref 8.9–10.3)
Chloride: 119 mmol/L — ABNORMAL HIGH (ref 101–111)
Creatinine, Ser: 2.95 mg/dL — ABNORMAL HIGH (ref 0.61–1.24)
GFR calc Af Amer: 24 mL/min — ABNORMAL LOW (ref >60)
GFR calc non Af Amer: 21 mL/min — ABNORMAL LOW (ref >60)
Glucose, Bld: 119 mg/dL — ABNORMAL HIGH (ref 65–99)
Potassium: 4.3 mmol/L (ref 3.5–5.1)
Sodium: 144 mmol/L (ref 135–145)

## 2015-09-17 LAB — CBC
HCT: 22.6 % — ABNORMAL LOW (ref 39.0–52.0)
Hemoglobin: 7.1 g/dL — ABNORMAL LOW (ref 13.0–17.0)
MCH: 32.6 pg (ref 26.0–34.0)
MCHC: 31.4 g/dL (ref 30.0–36.0)
MCV: 103.7 fL — ABNORMAL HIGH (ref 78.0–100.0)
Platelets: 121 10*3/uL — ABNORMAL LOW (ref 150–400)
RBC: 2.18 MIL/uL — ABNORMAL LOW (ref 4.22–5.81)
RDW: 13.9 % (ref 11.5–15.5)
WBC: 6.3 10*3/uL (ref 4.0–10.5)

## 2015-09-17 LAB — PREPARE RBC (CROSSMATCH)

## 2015-09-17 MED ORDER — SODIUM CHLORIDE 0.9 % IV SOLN
Freq: Once | INTRAVENOUS | Status: AC
  Administered 2015-09-17: 12:00:00 via INTRAVENOUS

## 2015-09-17 MED ORDER — FERUMOXYTOL INJECTION 510 MG/17 ML
510.0000 mg | INTRAVENOUS | Status: DC
  Administered 2015-09-17: 510 mg via INTRAVENOUS
  Filled 2015-09-17: qty 17

## 2015-09-17 NOTE — Progress Notes (Signed)
Subjective: Patient denies any difficulty breathing. Presently he doesn't have any nausea vomiting.  Objective: Vital signs in last 24 hours: Temp:  [98 F (36.7 C)-98.9 F (37.2 C)] 98 F (36.7 C) (11/21 [id number]) Pulse Rate:  [90-95] 91 (11/21 0619) Resp:  [18-20] 20 (11/21 0619) BP: (143-163)/(70-84) 163/77 mmHg (11/21 0619) SpO2:  [98 %-99 %] 98 % (11/21 0619)  Intake/Output from previous day: 11/20 0701 - 11/21 0700 In: 2118 [P.O.:240; I.V.:1878] Out: 1225 [Urine:1225] Intake/Output this shift:     Recent Labs  09/17/15 0555  HGB 7.1*    Recent Labs  09/17/15 0555  WBC 6.3  RBC 2.18*  HCT 22.6*  PLT 121*    Recent Labs  09/16/15 0548 09/17/15 0555  NA 143 144  K 4.4 4.3  CL 117* 119*  CO2 19* 22  BUN 51* 35*  CREATININE 3.85* 2.95*  GLUCOSE 119* 119*  CALCIUM 8.3* 8.4*   No results for input(s): LABPT, INR in the last 72 hours.  Generally patient is alert and/or no apparent distress Chest is clear to auscultation His heart exam regular rate and rhythm no murmur Extremities no edema  Assessment/Plan: Problem #1 acute kidney injury: His renal function continued to improve. Patient had 1200 mL of urine output. His potassium is normal. His BUN and creatinine is slightly higher than his baseline. Problem #2 anemia: Both his iron saturation and ferritin is low. Hence patient has iron deficiency anemia besides anemia of chronic disease. His hemoglobin is declining. Problem #3 history of pyelonephritis: Patient is getting better. Problem #4 history of mental retardation Problem #5 diabetes: His blood sugar is reasonably controlled Problem #6 hypertension. His blood pressure is slightly high but overall controlled. Problem #7 history of obstructive uropathy: Patient with Foley catheter. He has right hydronephrosis during his initial evaluation. Problem #8 metabolic bone disease: His calcium and phosphorus is in range. Presently patient is not on any  binder. Plan:1] We'll continue with present management 2]Agree with blood transfusion 3]We'll give him Feraheme 510 mg IV today and then another dose next week. 4]We'll check his basic metabolic panel and CBC in the morning.   Aracelli Woloszyn S 09/17/2015, 7:55 AM

## 2015-09-17 NOTE — Progress Notes (Signed)
Subjective: Patient is resting. He feels better. He is being treated empirically for UTI. His hemoglobin has dropped to 7.1. Patient has anemia of chronic disease.  Objective: Vital signs in last 24 hours: Temp:  [98 F (36.7 C)-98.9 F (37.2 C)] 98 F (36.7 C) (11/21 0619) Pulse Rate:  [90-95] 91 (11/21 0619) Resp:  [18-20] 20 (11/21 0619) BP: (143-163)/(70-84) 163/77 mmHg (11/21 0619) SpO2:  [98 %-99 %] 98 % (11/21 0619) Weight change:  Last BM Date: 09/14/15  Intake/Output from previous day: 11/20 0701 - 11/21 0700 In: 2118 [P.O.:240; I.V.:1878] Out: 1225 [Urine:1225]  PHYSICAL EXAM General appearance: alert and no distress Resp: clear to auscultation bilaterally Cardio: S1, S2 normal GI: soft, non-tender; bowel sounds normal; no masses,  no organomegaly Extremities: extremities normal, atraumatic, no cyanosis or edema  Lab Results:  Results for orders placed or performed during the hospital encounter of 09/13/15 (from the past 48 hour(s))  Glucose, capillary     Status: Abnormal   Collection Time: 09/15/15  8:11 AM  Result Value Ref Range   Glucose-Capillary 131 (H) 65 - 99 mg/dL  Basic metabolic panel     Status: Abnormal   Collection Time: 09/15/15  9:19 AM  Result Value Ref Range   Sodium 141 135 - 145 mmol/L   Potassium 4.2 3.5 - 5.1 mmol/L   Chloride 111 101 - 111 mmol/L   CO2 21 (L) 22 - 32 mmol/L   Glucose, Bld 120 (H) 65 - 99 mg/dL   BUN 66 (H) 6 - 20 mg/dL   Creatinine, Ser 5.24 (H) 0.61 - 1.24 mg/dL   Calcium 8.5 (L) 8.9 - 10.3 mg/dL   GFR calc non Af Amer 10 (L) >60 mL/min   GFR calc Af Amer 12 (L) >60 mL/min    Comment: (NOTE) The eGFR has been calculated using the CKD EPI equation. This calculation has not been validated in all clinical situations. eGFR's persistently <60 mL/min signify possible Chronic Kidney Disease.    Anion gap 9 5 - 15  Glucose, capillary     Status: Abnormal   Collection Time: 09/15/15 11:54 AM  Result Value Ref Range   Glucose-Capillary 125 (H) 65 - 99 mg/dL  Glucose, capillary     Status: Abnormal   Collection Time: 09/15/15  4:51 PM  Result Value Ref Range   Glucose-Capillary 136 (H) 65 - 99 mg/dL  Glucose, capillary     Status: Abnormal   Collection Time: 09/15/15  9:47 PM  Result Value Ref Range   Glucose-Capillary 122 (H) 65 - 99 mg/dL  Phosphorus     Status: None   Collection Time: 09/16/15  5:48 AM  Result Value Ref Range   Phosphorus 3.1 2.5 - 4.6 mg/dL  Ferritin     Status: None   Collection Time: 09/16/15  5:48 AM  Result Value Ref Range   Ferritin 83 24 - 336 ng/mL    Comment: Performed at [facility]  Iron and TIBC     Status: Abnormal   Collection Time: 09/16/15  5:48 AM  Result Value Ref Range   Iron 14 (L) 45 - 182 ug/dL   TIBC 181 (L) 250 - 450 ug/dL   Saturation Ratios 8 (L) 17.9 - 39.5 %   UIBC 167 ug/dL    Comment: Performed at [facility] metabolic panel     Status: Abnormal   Collection Time: 09/16/15  5:48 AM  Result Value Ref Range   Sodium 143 135 -  145 mmol/L   Potassium 4.4 3.5 - 5.1 mmol/L   Chloride 117 (H) 101 - 111 mmol/L   CO2 19 (L) 22 - 32 mmol/L   Glucose, Bld 119 (H) 65 - 99 mg/dL   BUN 51 (H) 6 - 20 mg/dL   Creatinine, Ser 3.85 (H) 0.61 - 1.24 mg/dL   Calcium 8.3 (L) 8.9 - 10.3 mg/dL   GFR calc non Af Amer 15 (L) >60 mL/min   GFR calc Af Amer 17 (L) >60 mL/min    Comment: (NOTE) The eGFR has been calculated using the CKD EPI equation. This calculation has not been validated in all clinical situations. eGFR's persistently <60 mL/min signify possible Chronic Kidney Disease.    Anion gap 7 5 - 15  Glucose, capillary     Status: Abnormal   Collection Time: 09/16/15  7:54 AM  Result Value Ref Range   Glucose-Capillary 137 (H) 65 - 99 mg/dL  Glucose, capillary     Status: Abnormal   Collection Time: 09/16/15 11:53 AM  Result Value Ref Range   Glucose-Capillary 111 (H) 65 - 99 mg/dL  Glucose, capillary     Status: Abnormal    Collection Time: 09/16/15  4:24 PM  Result Value Ref Range   Glucose-Capillary 135 (H) 65 - 99 mg/dL  Glucose, capillary     Status: Abnormal   Collection Time: 09/16/15  9:59 PM  Result Value Ref Range   Glucose-Capillary 101 (H) 65 - 99 mg/dL  Basic metabolic panel     Status: Abnormal   Collection Time: 09/17/15  5:55 AM  Result Value Ref Range   Sodium 144 135 - 145 mmol/L   Potassium 4.3 3.5 - 5.1 mmol/L   Chloride 119 (H) 101 - 111 mmol/L   CO2 22 22 - 32 mmol/L   Glucose, Bld 119 (H) 65 - 99 mg/dL   BUN 35 (H) 6 - 20 mg/dL   Creatinine, Ser 2.95 (H) 0.61 - 1.24 mg/dL   Calcium 8.4 (L) 8.9 - 10.3 mg/dL   GFR calc non Af Amer 21 (L) >60 mL/min   GFR calc Af Amer 24 (L) >60 mL/min    Comment: (NOTE) The eGFR has been calculated using the CKD EPI equation. This calculation has not been validated in all clinical situations. eGFR's persistently <60 mL/min signify possible Chronic Kidney Disease.    Anion gap 3 (L) 5 - 15  CBC     Status: Abnormal   Collection Time: 09/17/15  5:55 AM  Result Value Ref Range   WBC 6.3 4.0 - 10.5 K/uL   RBC 2.18 (L) 4.22 - 5.81 MIL/uL   Hemoglobin 7.1 (L) 13.0 - 17.0 g/dL   HCT 22.6 (L) 39.0 - 52.0 %   MCV 103.7 (H) 78.0 - 100.0 fL   MCH 32.6 26.0 - 34.0 pg   MCHC 31.4 30.0 - 36.0 g/dL   RDW 13.9 11.5 - 15.5 %   Platelets 121 (L) 150 - 400 K/uL    ABGS No results for input(s): PHART, PO2ART, TCO2, HCO3 in the last 72 hours.  Invalid input(s): PCO2 CULTURES Recent Results (from the past 240 hour(s))  Urine culture     Status: None   Collection Time: 09/13/15  2:31 PM  Result Value Ref Range Status   Specimen Description URINE, CLEAN CATCH  Final   Special Requests NONE  Final   Culture   Final    NO GROWTH 1 DAY Performed at [facility]  Report Status 09/15/2015 FINAL  Final   Studies/Results: No results found.  Medications: I have reviewed the patient's current medications.  Assesment:   Active Problems:   DM  (diabetes mellitus), type 2, uncontrolled (HCC)   Mental retardation   Anemia, chronic disease   Acute renal failure superimposed on stage 4 chronic kidney disease (HCC)   Essential hypertension   Pyelonephritis    Plan:  Medications reviewed Continue iv antibiotics Will type cross match and transfuse 2 units of PRBC. Will monitor CBC   LOS: 4 days   Zachary [name] 09/17/2015, 7:52 AM

## 2015-09-18 LAB — BASIC METABOLIC PANEL
Anion gap: 5 (ref 5–15)
BUN: 27 mg/dL — ABNORMAL HIGH (ref 6–20)
CO2: 21 mmol/L — ABNORMAL LOW (ref 22–32)
Calcium: 8.5 mg/dL — ABNORMAL LOW (ref 8.9–10.3)
Chloride: 120 mmol/L — ABNORMAL HIGH (ref 101–111)
Creatinine, Ser: 2.54 mg/dL — ABNORMAL HIGH (ref 0.61–1.24)
GFR calc Af Amer: 28 mL/min — ABNORMAL LOW (ref >60)
GFR calc non Af Amer: 25 mL/min — ABNORMAL LOW (ref >60)
Glucose, Bld: 87 mg/dL (ref 65–99)
Potassium: 4.5 mmol/L (ref 3.5–5.1)
Sodium: 146 mmol/L — ABNORMAL HIGH (ref 135–145)

## 2015-09-18 LAB — TYPE AND SCREEN
ABO/RH(D): B POS
Antibody Screen: NEGATIVE
Unit division: 0
Unit division: 0

## 2015-09-18 LAB — CBC
HCT: 26.2 % — ABNORMAL LOW (ref 39.0–52.0)
Hemoglobin: 8.8 g/dL — ABNORMAL LOW (ref 13.0–17.0)
MCH: 32.5 pg (ref 26.0–34.0)
MCHC: 33.6 g/dL (ref 30.0–36.0)
MCV: 96.7 fL (ref 78.0–100.0)
Platelets: 80 10*3/uL — ABNORMAL LOW (ref 150–400)
RBC: 2.71 MIL/uL — ABNORMAL LOW (ref 4.22–5.81)
RDW: 18 % — ABNORMAL HIGH (ref 11.5–15.5)
WBC: 7.1 10*3/uL (ref 4.0–10.5)

## 2015-09-18 LAB — GLUCOSE, CAPILLARY
Glucose-Capillary: 87 mg/dL (ref 65–99)
Glucose-Capillary: 95 mg/dL (ref 65–99)

## 2015-09-18 NOTE — Discharge Summary (Signed)
Physician Discharge Summary  Patient ID: Zachary [name] MRN: [id number] DOB/AGE: June 10, 1946 69 y.o. Primary Care Physician:Carmichael Burdette, MD Admit date: 09/13/2015 Discharge date: 09/18/2015    Discharge Diagnoses:   Active Problems:   DM (diabetes mellitus), type 2, uncontrolled ([place])   Mental retardation   Anemia, chronic disease   Acute renal failure superimposed on stage 4 chronic kidney disease (HCC)   Essential hypertension   Pyelonephritis     Medication List    STOP taking these medications        ciprofloxacin 500 MG tablet  Commonly known as:  CIPRO      TAKE these medications        carvedilol 6.25 MG tablet  Commonly known as:  COREG  Take 1 tablet (6.25 mg total) by mouth 2 (two) times daily with a meal.     ferrous sulfate 325 (65 FE) MG tablet  Take 325 mg by mouth daily with breakfast.     HYDROcodone-acetaminophen 5-325 MG tablet  Commonly known as:  NORCO/VICODIN  Take 1 tablet by mouth every 6 (six) hours as needed for moderate pain.     isosorbide-hydrALAZINE 20-37.5 MG tablet  Commonly known as:  BIDIL  Take 1 tablet by mouth 3 (three) times daily.     levothyroxine 75 MCG tablet  Commonly known as:  SYNTHROID, LEVOTHROID  Take 75 mcg by mouth daily before breakfast.     linagliptin 5 MG Tabs tablet  Commonly known as:  TRADJENTA  Take 5 mg by mouth daily.     niacin 1000 MG CR tablet  Commonly known as:  NIASPAN  Take 1,000 mg by mouth at bedtime.     nitrofurantoin 100 MG capsule  Commonly known as:  MACRODANTIN  Take 100 mg by mouth 2 (two) times daily.     omeprazole 20 MG capsule  Commonly known as:  PRILOSEC  Take 40 mg by mouth daily.     simvastatin 20 MG tablet  Commonly known as:  ZOCOR  Take 20 mg by mouth at bedtime.     sodium polystyrene 15 GM/60ML suspension  Commonly known as:  KAYEXALATE  Take 30 g by mouth. Daily on Monday and Friday     tamsulosin 0.4 MG Caps capsule  Commonly known as:  FLOMAX  Take  0.4 mg by mouth daily.     torsemide 20 MG tablet  Commonly known as:  DEMADEX  Take 20 mg by mouth daily. Daily at 8am     Vitamin D (Ergocalciferol) 50000 UNITS Caps capsule  Commonly known as:  DRISDOL  Take 1 capsule (50,000 Units total) by mouth every 7 (seven) days.        Discharged Condition:  improved    Consults: nephrology  Significant Diagnostic Studies: Ct Abdomen Pelvis Wo Contrast  09/13/2015  CLINICAL DATA:  Acute onset of vomiting for 3 days. Initial encounter. EXAM: CT ABDOMEN AND PELVIS WITHOUT CONTRAST TECHNIQUE: Multidetector CT imaging of the abdomen and pelvis was performed following the standard protocol without IV contrast. COMPARISON:  CT of the abdomen and pelvis from 07/08/2015 FINDINGS: Minimal bibasilar atelectasis is noted. Trace pericardial fluid remains within normal limits. Diffuse coronary artery calcifications are seen. The liver and spleen are unremarkable in appearance. The gallbladder is within normal limits. The pancreas and adrenal glands are unremarkable. There is minimal right-sided hydronephrosis, with distention of the right renal pelvis. Mildly asymmetric right perinephric stranding is noted, with wall thickening along the course of the right ureter. No  distal obstructing stone is seen. This raises concern for acute right-sided ureteritis and pyelonephritis. A 2.6 cm cyst is noted at the interpole region of the left kidney. There is slight prominence of the left ureter. No free fluid is identified. The small bowel is unremarkable in appearance. The stomach is within normal limits. No acute vascular abnormalities are seen. Minimal calcification is noted along the distal abdominal aorta and its branches. The appendix is normal in caliber, without evidence of appendicitis. The colon is unremarkable in appearance. The bladder is diffusely thick walled, concerning for cystitis, though some of this may reflect chronic inflammation. The prostate is  enlarged, measuring 5.5 cm in transverse dimension. No inguinal lymphadenopathy is seen. No acute osseous abnormalities are identified. IMPRESSION: 1. Minimal right-sided hydronephrosis, with distention of the right renal pelvis. Mildly asymmetric right perinephric stranding and right ureteral wall thickening. This raises concern for acute right-sided ureteritis and pyelonephritis. No distal obstructing stone seen. 2. Diffusely thick-walled bladder raises question for cystitis, though some of this may reflect chronic inflammation. 3. Left renal cyst noted. Slight prominence of the left ureter, without additional abnormality on the left side. 4. Diffuse coronary artery calcifications seen. 5. Enlarged prostate seen. Electronically Signed   By: Garald Balding M.D.   On: 09/13/2015 19:29   Dg Chest 2 View  09/13/2015  CLINICAL DATA:  Vomiting for 3 days EXAM: CHEST - 2 VIEW COMPARISON:  None. FINDINGS: The heart size and mediastinal contours are within normal limits. Both lungs are clear. The visualized skeletal structures are unremarkable. IMPRESSION: No active disease. Electronically Signed   By: Inez Catalina M.D.   On: 09/13/2015 17:39    Lab Results: Basic Metabolic Panel:  Recent Labs  09/16/15 0548 09/17/15 0555 09/18/15 0200  NA 143 144 146*  K 4.4 4.3 4.5  CL 117* 119* 120*  CO2 19* 22 21*  GLUCOSE 119* 119* 87  BUN 51* 35* 27*  CREATININE 3.85* 2.95* 2.54*  CALCIUM 8.3* 8.4* 8.5*  PHOS 3.1  --   --    Liver Function Tests: No results for input(s): AST, ALT, ALKPHOS, BILITOT, PROT, ALBUMIN in the last 72 hours.   CBC:  Recent Labs  09/17/15 0555 09/18/15 0200  WBC 6.3 7.1  HGB 7.1* 8.8*  HCT 22.6* 26.2*  MCV 103.7* 96.7  PLT 121* 80*    Recent Results (from the past 240 hour(s))  Urine culture     Status: None   Collection Time: 09/13/15  2:31 PM  Result Value Ref Range Status   Specimen Description URINE, CLEAN CATCH  Final   Special Requests NONE  Final    Culture   Final    NO GROWTH 1 DAY Performed at [facility]    Report Status 09/15/2015 FINAL  Final     Hospital Course:   This is a 69 years old male with history of multiple medical illnesses who was admitted due nausea, vomiting and fever as case of pyelonephritis. Patient has history of obstructive uropathy and CKD stage IV. He was empirically treated with IV antibiotics. His urine culture remained negative, however patient clinically improved. He was also followed by his nephrologist. He was rehydrated and transfused. Patient overall improved and he is back to his baseline.  Discharge Exam: Blood pressure 159/86, pulse 100, temperature 98.3 F (36.8 C), temperature source Oral, resp. rate 20, height 5\' 8"  (1.727 m), weight 81.738 kg (180 lb 3.2 oz), SpO2 99 %.     Disposition:  Assisted living        Follow-up Information    Follow up with [facility], MD.   Specialty:  Internal Medicine   Contact information:   [place] [address] [phone]       Follow up with [facility], MD.   Specialty:  Internal Medicine   Contact information:   [place] [address] [phone]       Follow up with [facility], MD In 1 week.   Specialty:  Internal Medicine   Contact information:   [place]  [address] [phone]       Signed: [name]   09/18/2015, 8:14 AM

## 2015-09-18 NOTE — Progress Notes (Signed)
Subjective: Patient presently offers no complaints.  Objective: Vital signs in last 24 hours: Temp:  [98 F (36.7 C)-99.6 F (37.6 C)] 98.3 F (36.8 C) (11/22 [id number]) Pulse Rate:  [89-100] 100 (11/22 0637) Resp:  [16-20] 20 (11/22 0637) BP: (143-188)/(69-86) 159/86 mmHg (11/22 0637) SpO2:  [96 %-99 %] 99 % (11/22 0637)  Intake/Output from previous day: 11/21 0701 - 11/22 0700 In: 2773.7 [P.O.:1060; I.V.:916.7; Blood:680; IV [id number] Out: [id number] [Urine:1650] Intake/Output this shift:     Recent Labs  09/17/15 0555 09/18/15 0200  HGB 7.1* 8.8*    Recent Labs  09/17/15 0555 09/18/15 0200  WBC 6.3 7.1  RBC 2.18* 2.71*  HCT 22.6* 26.2*  PLT 121* 80*    Recent Labs  09/17/15 0555 09/18/15 0200  NA 144 146*  K 4.3 4.5  CL 119* 120*  CO2 22 21*  BUN 35* 27*  CREATININE 2.95* 2.54*  GLUCOSE 119* 87  CALCIUM 8.4* 8.5*   No results for input([name]): LABPT, INR in the last 72 hours.  Generally patient is alert and/or no apparent distress Chest is clear to auscultation His heart exam regular rate and rhythm no murmur Extremities no edema  Assessment/Plan: Problem #1 acute kidney injury: His renal function continued to improve. Patient had 1600 mL of urine output. His potassium is normal. His BUN and creatinine has returned to his baseline. Patient presently does not have any uremic signs and symptoms. Problem #2 anemia: Both his iron saturation and ferritin is low. Hence patient has iron deficiency anemia besides anemia of chronic disease. Patient has received blood transfusion and his hemoglobin is better. Problem #3 history of pyelonephritis: Patient is getting better. Presently patient is afebrile and his white blood cell count is normal. Problem #4 history of mental retardation Problem #5 diabetes: His blood sugar is reasonably controlled Problem #6 hypertension. His blood pressure is slightly high but overall controlled. Problem #7 history of obstructive  uropathy: Patient with Foley catheter. He has right hydronephrosis Problem #8 metabolic bone disease: His calcium and phosphorus is in range. Presently patient is not on any binder. Plan:1] We'll follow him as an outpatient. 2]We'll give him Feraheme 510 mg IV  another dose next week.    Zachary [name] 09/18/2015, 9:35 AM

## 2015-09-18 NOTE — Progress Notes (Signed)
Pt returning to [facility].  Representative here to pick pt up.  Pt sent with belongings and paperwork.

## 2015-09-18 NOTE — Care Management Note (Signed)
Case Management Note  Patient Details  Name: Zachary [name] MRN: [id number] Date of Birth: 1946-03-28  Subjective/Objective:                    Action/Plan:   Expected Discharge Date:                  Expected Discharge Plan:  Assisted Living / Rest Home  In-House Referral:  Clinical Social Work  Discharge planning Services  CM Consult  Post Acute Care Choice:  NA Choice offered to:  NA  DME Arranged:    DME Agency:     HH Arranged:    [place] Agency:     Status of Service:  Completed, signed off  Medicare Important Message Given:  Yes Date Medicare IM Given:    Medicare IM give by:    Date Additional Medicare IM Given:    Additional Medicare Important Message give by:     If discussed at [place] of Stay Meetings, dates discussed:    Additional Comments: Pt discharged back to [facility] today. CSW to arrange discharge to facility. Christinia Gully [place], RN 09/18/2015, 8:30 AM

## 2015-09-18 NOTE — Clinical Social Work Note (Signed)
CSW facilitated discharge.  CSW spoke with Tammy at [facility] and advised that patient was being discharged today. Tammy advised that she would pick patient up.  CSW left a message for patient's guardian, Zachary [name], Zachary [name] and advised that patient was being discharged today and would return to [place].   CSW signing off.

## 2015-09-18 NOTE — NC FL2 (Signed)
[place] LEVEL OF CARE SCREENING TOOL     IDENTIFICATION  Patient Name: Zachary [name] Birthdate: 02/28/1946 Sex: male Admission Date (Current Location): 09/13/2015  [facility] and [place] Number:     Facility and Address:  [facility] [address], [place]      Provider Number: [id number]  Attending Physician Name and Address:  [name], MD  Relative Name and Phone Number:       Current Level of Care: Hospital Recommended Level of Care: [place] Prior Approval Number:    Date Approved/Denied:   PASRR Number:    Discharge Plan: Other (Comment) [facility])    Current Diagnoses: Patient Active Problem List   Diagnosis Date Noted  . Pyelonephritis 09/13/2015  . UTI (lower urinary tract infection) 07/20/2015  . Heme positive stool   . Absolute anemia   . Abnormal CT scan, esophagus   . Protein-calorie malnutrition, severe ([place]) 07/10/2015  . Acute renal failure superimposed on stage 4 chronic kidney disease ([place]) 07/08/2015  . Nausea and vomiting 07/08/2015  . Essential hypertension 07/08/2015  . Hypernatremia 07/08/2015  . Hyperlipidemia 07/08/2015  . Nausea & vomiting 07/08/2015  . Hyperkalemia 05/22/2015  . Obstructive uropathy 04/17/2015  . ARF (acute renal failure) ([place]) 04/17/2015  . Bladder neck contracture   . Urinary retention   . Anemia, chronic disease 04/16/2015  . BPH (benign prostatic hypertrophy) with urinary retention 03/03/2015  . BPH (benign prostatic hypertrophy) with urinary obstruction 01/04/2014  . BPH (benign prostatic hypertrophy) 12/29/2013  . Acute renal failure ([place]) 12/25/2013  . Renal insufficiency 12/25/2013  . Dehydration 12/25/2013  . Mental retardation 10/10/2011  . Acute pyelonephritis 10/07/2011  . 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 Bladder wall thickening 10/06/2011  . DM (diabetes mellitus), type 2, uncontrolled ([place]) 10/02/2011  . Iron deficiency  anemia 10/02/2011  . Bacteremia 10/02/2011  . FX CLOSED FIBULA NOS 02/10/2008    Orientation ACTIVITIES/SOCIAL BLADDER RESPIRATION    Place, Situation, Time, Self  Family supportive Continent Normal  BEHAVIORAL SYMPTOMS/MOOD NEUROLOGICAL BOWEL NUTRITION STATUS      Continent Diet (Diet Carb Modified/Reduced Concentrated Sweets)  PHYSICIAN VISITS COMMUNICATION OF NEEDS Height & Weight Skin    Verbally 5\' 8"  (172.7 cm) 180 lbs. Normal          AMBULATORY STATUS RESPIRATION    Assist independent Normal      Personal Care Assistance Level of Assistance  Dressing, Bathing Bathing Assistance: Limited assistance   Dressing Assistance: Limited assistance      Functional Limitations Info  Speech (Stuttering)     Speech Info: Impaired (Stuttering)       SPECIAL CARE FACTORS FREQUENCY                      Additional Factors Info   (Full Code)               Current Medications (09/18/2015): Current Facility-Administered Medications  Medication Dose Route Frequency Provider Last Rate Last Dose  . 0.9 %  sodium chloride infusion   Intravenous Continuous Fran Lowes, MD   Stopped at 09/18/15 0854  . carvedilol (COREG) tablet 6.25 mg  6.25 mg Oral BID WC Nimish C Anastasio Champion, MD   6.25 mg at 09/18/15 0850  . cefTRIAXone (ROCEPHIN) 1 g in dextrose 5 % 50 mL IVPB  1 g Intravenous Q24H Nimish C Gosrani, MD 100 mL/hr at 09/17/15 1939 1 g at 09/17/15 1939  . enoxaparin (  LOVENOX) injection 30 mg  30 mg Subcutaneous Q24H Nimish C Anastasio Champion, MD   30 mg at 09/17/15 2238  . ferumoxytol (FERAHEME) 510 mg in sodium chloride 0.9 % 100 mL IVPB  510 mg Intravenous Weekly Fran Lowes, MD   510 mg at 09/17/15 1026  . HYDROcodone-acetaminophen (NORCO/VICODIN) 5-325 MG per tablet 1 tablet  1 tablet Oral Q6H PRN Nimish C Gosrani, MD      . insulin aspart (novoLOG) injection 0-5 Units  0-5 Units Subcutaneous QHS Doree Albee, MD   0 Units at 09/13/15 2301  . insulin aspart  (novoLOG) injection 0-9 Units  0-9 Units Subcutaneous TID WC Doree Albee, MD   1 Units at 09/16/15 1722  . isosorbide-hydrALAZINE (BIDIL) 20-37.5 MG per tablet 1 tablet  1 tablet Oral TID Doree Albee, MD   1 tablet at 09/18/15 0850  . levothyroxine (SYNTHROID, LEVOTHROID) tablet 75 mcg  75 mcg Oral QAC breakfast Doree Albee, MD   75 mcg at 09/18/15 0850  . linagliptin (TRADJENTA) tablet 5 mg  5 mg Oral Daily Nimish Luther Parody, MD   5 mg at 09/18/15 0850  . niacin (NIASPAN) CR tablet 1,000 mg  1,000 mg Oral QHS Nimish C Anastasio Champion, MD   1,000 mg at 09/17/15 2238  . ondansetron (ZOFRAN) tablet 4 mg  4 mg Oral Q6H PRN Nimish C Gosrani, MD       Or  . ondansetron (ZOFRAN) injection 4 mg  4 mg Intravenous Q6H PRN Doree Albee, MD   4 mg at 09/14/15 0508  . pantoprazole (PROTONIX) EC tablet 40 mg  40 mg Oral Daily Doree Albee, MD   40 mg at 09/18/15 0850  . tamsulosin (FLOMAX) capsule 0.4 mg  0.4 mg Oral Daily Nimish C Gosrani, MD   0.4 mg at 09/18/15 0850   Do not use this list as official medication orders. Please verify with discharge summary.  Discharge Medications:   Medication List    STOP taking these medications        ciprofloxacin 500 MG tablet  Commonly known as:  CIPRO      TAKE these medications        carvedilol 6.25 MG tablet  Commonly known as:  COREG  Take 1 tablet (6.25 mg total) by mouth 2 (two) times daily with a meal.     ferrous sulfate 325 (65 FE) MG tablet  Take 325 mg by mouth daily with breakfast.     HYDROcodone-acetaminophen 5-325 MG tablet  Commonly known as:  NORCO/VICODIN  Take 1 tablet by mouth every 6 (six) hours as needed for moderate pain.     isosorbide-hydrALAZINE 20-37.5 MG tablet  Commonly known as:  BIDIL  Take 1 tablet by mouth 3 (three) times daily.     levothyroxine 75 MCG tablet  Commonly known as:  SYNTHROID, LEVOTHROID  Take 75 mcg by mouth daily before breakfast.     linagliptin 5 MG Tabs tablet  Commonly known  as:  TRADJENTA  Take 5 mg by mouth daily.     niacin 1000 MG CR tablet  Commonly known as:  NIASPAN  Take 1,000 mg by mouth at bedtime.     nitrofurantoin 100 MG capsule  Commonly known as:  MACRODANTIN  Take 100 mg by mouth 2 (two) times daily.     omeprazole 20 MG capsule  Commonly known as:  PRILOSEC  Take 40 mg by mouth daily.     simvastatin 20 MG tablet  Commonly known as:  ZOCOR  Take 20 mg by mouth at bedtime.     sodium polystyrene 15 GM/60ML suspension  Commonly known as:  KAYEXALATE  Take 30 g by mouth. Daily on Monday and Friday     tamsulosin 0.4 MG Caps capsule  Commonly known as:  FLOMAX  Take 0.4 mg by mouth daily.     torsemide 20 MG tablet  Commonly known as:  DEMADEX  Take 20 mg by mouth daily. Daily at 8am     Vitamin D (Ergocalciferol) 50000 UNITS Caps capsule  Commonly known as:  DRISDOL  Take 1 capsule (50,000 Units total) by mouth every 7 (seven) days.        Relevant Imaging Results:  Relevant Lab Results:  Recent Labs    Additional Information    Ihor Gully, LCSW [phone]

## 2015-09-18 NOTE — Care Management Important Message (Signed)
Important Message  Patient Details  Name: Zachary [name] MRN: [id number] Date of Birth: Feb 29, 1946   Medicare Important Message Given:  Yes    Joylene Draft, RN 09/18/2015, 8:29 AM

## 2015-09-19 DIAGNOSIS — N390 Urinary tract infection, site not specified: Secondary | ICD-10-CM | POA: Diagnosis not present

## 2015-09-19 DIAGNOSIS — D649 Anemia, unspecified: Secondary | ICD-10-CM | POA: Diagnosis not present

## 2015-09-19 DIAGNOSIS — N181 Chronic kidney disease, stage 1: Secondary | ICD-10-CM | POA: Diagnosis not present

## 2015-09-26 ENCOUNTER — Telehealth: Payer: Self-pay

## 2015-09-26 DIAGNOSIS — E1165 Type 2 diabetes mellitus with hyperglycemia: Secondary | ICD-10-CM | POA: Diagnosis not present

## 2015-09-26 DIAGNOSIS — N184 Chronic kidney disease, stage 4 (severe): Secondary | ICD-10-CM | POA: Diagnosis not present

## 2015-09-26 DIAGNOSIS — L03114 Cellulitis of left upper limb: Secondary | ICD-10-CM | POA: Diagnosis not present

## 2015-09-26 NOTE — Telephone Encounter (Signed)
Rec'd phone call from Dr. Legrand Rams with request to eval. Left UA AVG site; reported localized swelling, induration and tenderness at site.  Stated pt. is afebrile.  Offered 3:45 PM appt. today, however, transportation could not be arranged.  Call placed to [facility].  Appt. given for 09/27/15 @ 10:15 AM; appt. was accepted by receptionist at [facility]

## 2015-09-27 ENCOUNTER — Encounter: Payer: Self-pay | Admitting: Family

## 2015-09-27 ENCOUNTER — Other Ambulatory Visit: Payer: Self-pay

## 2015-09-27 ENCOUNTER — Ambulatory Visit (INDEPENDENT_AMBULATORY_CARE_PROVIDER_SITE_OTHER): Payer: Medicare Other | Admitting: Family

## 2015-09-27 VITALS — BP 139/79 | HR 100 | Temp 99.1°F | Resp 18 | Ht 68.5 in | Wt 198.0 lb

## 2015-09-27 DIAGNOSIS — T82590A Other mechanical complication of surgically created arteriovenous fistula, initial encounter: Secondary | ICD-10-CM

## 2015-09-27 DIAGNOSIS — M79602 Pain in left arm: Secondary | ICD-10-CM

## 2015-09-27 DIAGNOSIS — T82510A Breakdown (mechanical) of surgically created arteriovenous fistula, initial encounter: Secondary | ICD-10-CM

## 2015-09-27 DIAGNOSIS — N189 Chronic kidney disease, unspecified: Secondary | ICD-10-CM

## 2015-09-27 DIAGNOSIS — R209 Unspecified disturbances of skin sensation: Secondary | ICD-10-CM | POA: Insufficient documentation

## 2015-09-27 DIAGNOSIS — M7989 Other specified soft tissue disorders: Secondary | ICD-10-CM

## 2015-09-27 NOTE — Progress Notes (Signed)
Postoperative Access Visit   History of Present Illness  Zachary [name] is a 69 y.o. year old male patient of Dr. Scot Dock who is s/p left upper arm AV graft placed with a 4-7 mm PTFE graft on 07/06/2015.  He is not yet on dialysis. Zachary [name] is a resident of [facility] in [place]. He has a hx of mental retardation and DM. Dr. Scot Dock last saw pt on 08/08/15. At that time the graft was working well although he did have moderate left upper extremity swelling. Dr. Scot Dock wrote instructions for him to elevate his arm at the nursing facility. If this does not improve or worsens he may ultimately require a fistulogram to look for central venous stenosis which might be amenable to venoplasty. The pt was to return as needed.  He returns today at the request of Dr. Legrand Rams with reported swelling, induration, and tenderness at left upper arm. The pt denies fever or chills, denies cold sensation or numbness in his left hand. His oral temperature today is 99.1.  The nursing facility staff member with pt states she is not certain, but does not think his left arm has been elevated very much. He was started on Augmentin 500/125 yesterday, bid for 7 days.   For VQI Use Only  PRE-ADM LIVING: Nursing home  AMB STATUS: Ambulatory   Past Medical History  Diagnosis Date  . Hypertension   . Diabetes mellitus   . Smoking   . Abnormal CT scan, kidney 10/06/2011  . UTI (lower urinary tract infection) 10/06/2011  . Bladder wall thickening 10/06/2011  . 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 Poor historian poor historian  . BPH (benign prostatic hypertrophy)   . Anxiety     mental retardation  . Hypothyroidism   . MR (mental retardation)   . Acute pyelonephritis 10/07/2011  . Hyperkalemia   . Renal insufficiency     chronic history  . Renal failure (ARF), acute on chronic (HCC)   . Anemia     normocytic  . Edema      history of lower extremity edema  . Hyperlipidemia   .  Impaired speech     Social History   Social History  . Marital Status: Single    Spouse Name: N/A  . Number of Children: N/A  . Years of Education: N/A   Occupational History  . Not on file.   Social History Main Topics  . Smoking status: Never Smoker   . Smokeless tobacco: Never Used  . Alcohol Use: No     Comment: occ. use   . Drug Use: No  . Sexual Activity: No   Other Topics Concern  . Not on file   Social History Narrative   Lives at assisted living facility      Family History  Problem Relation Age of Onset  . Cancer Mother     Past Surgical History  Procedure Laterality Date  . Cystoscopy with urethral dilatation N/A 12/29/2013    Procedure: CYSTOSCOPY WITH URETHRAL DILATATION;  Surgeon: Marissa Nestle, MD;  Location: [facility];  Service: Urology;  Laterality: N/A;  . Transurethral resection of prostate N/A 01/04/2014    Procedure: TRANSURETHRAL RESECTION OF THE PROSTATE (TURP) (procedure #2);  Surgeon: Marissa Nestle, MD;  Location: [facility];  Service: Urology;  Laterality: N/A;  . Circumcision N/A 01/04/2014    Procedure: CIRCUMCISION ADULT (procedure #1);  Surgeon: Marissa Nestle, MD;  Location: [facility];  Service: Urology;  Laterality: N/A;  . Cystoscopy w/ retrogrades Bilateral 06/29/2015    Procedure: CYSTOSCOPY, DILATION OF URETHRAL STRICTURE WITH BILATERAL RETROGRADE PYELOGRAM,SUPRAPUBIC TUBE CHANGE;  Surgeon: Festus Aloe, MD;  Location: [facility];  Service: Urology;  Laterality: Bilateral;  . Av fistula placement Left 07/06/2015    Procedure:  INSERTION LEFT ARM ARTERIOVENOUS GORTEX GRAFT;  Surgeon: Angelia Mould, MD;  Location: [facility];  Service: Vascular;  Laterality: Left;    No Known Allergies   Current Outpatient Prescriptions on File Prior to Visit  Medication Sig Dispense Refill  . carvedilol (COREG) 6.25 MG tablet Take 1 tablet (6.25 mg total) by mouth 2 (two) times daily with a meal. 60 tablet 3  . ferrous sulfate 325 (65 FE) MG tablet  Take 325 mg by mouth daily with breakfast.    . isosorbide-hydrALAZINE (BIDIL) 20-37.5 MG per tablet Take 1 tablet by mouth 3 (three) times daily. (Patient taking differently: Take 1 tablet by mouth 3 (three) times daily. 8am,2pm,8pm) 90 tablet 3  . levothyroxine (SYNTHROID, LEVOTHROID) 75 MCG tablet Take 75 mcg by mouth daily before breakfast.     . linagliptin (TRADJENTA) 5 MG TABS tablet Take 5 mg by mouth daily.    . niacin (NIASPAN) 1000 MG CR tablet Take 1,000 mg by mouth at bedtime.    Marland Kitchen omeprazole (PRILOSEC) 20 MG capsule Take 40 mg by mouth daily.    . simvastatin (ZOCOR) 20 MG tablet Take 20 mg by mouth at bedtime.     . tamsulosin (FLOMAX) 0.4 MG CAPS capsule Take 0.4 mg by mouth daily.    Marland Kitchen torsemide (DEMADEX) 20 MG tablet Take 20 mg by mouth daily. Daily at 8am    . Vitamin D, Ergocalciferol, (DRISDOL) 50000 UNITS CAPS capsule Take 1 capsule (50,000 Units total) by mouth every 7 (seven) days. (Patient taking differently: Take 50,000 Units by mouth every 7 (seven) days. On Monday) 30 capsule 3  . HYDROcodone-acetaminophen (NORCO/VICODIN) 5-325 MG per tablet Take 1 tablet by mouth every 6 (six) hours as needed for moderate pain. (Patient not taking: Reported on 09/27/2015) 30 tablet 0  . nitrofurantoin (MACRODANTIN) 100 MG capsule Take 100 mg by mouth 2 (two) times daily.     . sodium polystyrene (KAYEXALATE) 15 GM/60ML suspension Take 30 g by mouth. Daily on Monday and Friday     No current facility-administered medications on file prior to visit.     REVIEW OF SYSTEMS: see HPI for pertinent positives and negatives    Physical Examination Filed Vitals:   09/27/15 0953  BP: 139/79  Pulse: 100  Temp: 99.1 F (37.3 C)  TempSrc: Oral  Resp: 18  Height: 5' 8.5" (1.74 m)  Weight: 198 lb (89.812 kg)  SpO2: 99%   Body mass index is 29.66 kg/(m^2).   General: The patient appears their stated age.   HEENT:  No gross abnormalities Pulmonary: Respirations are  non-labored Abdomen: Soft and non-tender. Musculoskeletal: There are no major deformities.   Neurologic: No focal weakness or paresthesias are detected. Skin: There are no ulcer or rashes noted. Psychiatric: The patient has normal affect, seems to answer questions appropriately. Cardiovascular: There is a regular rate and rhythm. Left arm and hand has 2-3+ non pitting edema, mild erythema at the left upper arm AVG.  Left UE: Incision is well healed, skin feels warm, hand grip is 4/5, sensation in digits is intact, faintly palpable thrill, bruit can be auscultated    Medical Decision Making  Zachary [name] is a 69  y.o. year old male who is s/p left upper arm AV graft placed with a 4-7 mm PTFE graft on 07/06/2015.  He is not yet on dialysis. Zachary [name] is a resident of [facility] in [place]. Left fingers to upper arm has 2-3+ non pitting edema. Mild to moderate erythema over AVG site. No open wounds, no drainage. Dr. Oneida Alar spoke with pt and examined him. Will schedule shunto-gram with Dr. Scot Dock in a week, after pt has been on Augmentin for a week.   NICKEL, Sharmon Leyden, RN, MSN, FNP-C [facility] Office: [phone]  09/27/2015, 10:14 AM  Clinic MD: Oneida Alar

## 2015-10-03 ENCOUNTER — Emergency Department (HOSPITAL_COMMUNITY): Payer: Medicare Other

## 2015-10-03 ENCOUNTER — Emergency Department (HOSPITAL_COMMUNITY)
Admission: EM | Admit: 2015-10-03 | Discharge: 2015-10-03 | Disposition: A | Discharge status: Home / Self Care | Payer: Medicare Other | Attending: Emergency Medicine | Admitting: Emergency Medicine

## 2015-10-03 ENCOUNTER — Encounter (HOSPITAL_COMMUNITY): Payer: Self-pay

## 2015-10-03 DIAGNOSIS — I10 Essential (primary) hypertension: Secondary | ICD-10-CM | POA: Diagnosis not present

## 2015-10-03 DIAGNOSIS — E039 Hypothyroidism, unspecified: Secondary | ICD-10-CM | POA: Insufficient documentation

## 2015-10-03 DIAGNOSIS — R52 Pain, unspecified: Secondary | ICD-10-CM

## 2015-10-03 DIAGNOSIS — L539 Erythematous condition, unspecified: Secondary | ICD-10-CM | POA: Insufficient documentation

## 2015-10-03 DIAGNOSIS — Z7984 Long term (current) use of oral hypoglycemic drugs: Secondary | ICD-10-CM | POA: Diagnosis not present

## 2015-10-03 DIAGNOSIS — E119 Type 2 diabetes mellitus without complications: Secondary | ICD-10-CM | POA: Insufficient documentation

## 2015-10-03 DIAGNOSIS — E785 Hyperlipidemia, unspecified: Secondary | ICD-10-CM | POA: Insufficient documentation

## 2015-10-03 DIAGNOSIS — Z79899 Other long term (current) drug therapy: Secondary | ICD-10-CM | POA: Insufficient documentation

## 2015-10-03 DIAGNOSIS — Z87440 Personal history of urinary (tract) infections: Secondary | ICD-10-CM | POA: Diagnosis not present

## 2015-10-03 DIAGNOSIS — D649 Anemia, unspecified: Secondary | ICD-10-CM | POA: Insufficient documentation

## 2015-10-03 DIAGNOSIS — M7989 Other specified soft tissue disorders: Secondary | ICD-10-CM | POA: Diagnosis not present

## 2015-10-03 DIAGNOSIS — N400 Enlarged prostate without lower urinary tract symptoms: Secondary | ICD-10-CM | POA: Diagnosis not present

## 2015-10-03 DIAGNOSIS — Z87448 Personal history of other diseases of urinary system: Secondary | ICD-10-CM | POA: Diagnosis not present

## 2015-10-03 DIAGNOSIS — R600 Localized edema: Secondary | ICD-10-CM | POA: Diagnosis not present

## 2015-10-03 NOTE — ED Notes (Signed)
Patient and family verbalize understanding of discharge instructions, home care, and follow up care. Patient ambulatory to waiting room with family members to wait for [facility] transportation. No needs or concerns verbalized by patient or family.

## 2015-10-03 NOTE — Discharge Instructions (Signed)
The VASCULAR DOCTORS HAVE a plan in place for the swelling. - finish the antibiotics. - Call the clinic tomorrow -make sure Zachary [name] has an appointment set up for next week.  KEEP THE ARM ELEVATED.   Edema Edema is an abnormal buildup of fluids in your bodytissues. Edema is somewhatdependent on gravity to pull the fluid to the lowest place in your body. That makes the condition more common in the legs and thighs (lower extremities). Painless swelling of the feet and ankles is common and becomes more likely as you get older. It is also common in looser tissues, like around your eyes.  When the affected area is squeezed, the fluid may move out of that spot and leave a dent for a few moments. This dent is called pitting.  CAUSES  There are many possible causes of edema. Eating too much salt and being on your feet or sitting for a long time can cause edema in your legs and ankles. Hot weather may make edema worse. Common medical causes of edema include:  Heart failure.  Liver disease.  Kidney disease.  Weak blood vessels in your legs.  Cancer.  An injury.  Pregnancy.  Some medications.  Obesity. SYMPTOMS  Edema is usually painless.Your skin may look swollen or shiny.  DIAGNOSIS  Your health care provider may be able to diagnose edema by asking about your medical history and doing a physical exam. You may need to have tests such as X-rays, an electrocardiogram, or blood tests to check for medical conditions that may cause edema.  TREATMENT  Edema treatment depends on the cause. If you have heart, liver, or kidney disease, you need the treatment appropriate for these conditions. General treatment may include:  Elevation of the affected body part above the level of your heart.  Compression of the affected body part. Pressure from elastic bandages or support stockings squeezes the tissues and forces fluid back into the blood vessels. This keeps fluid from entering the  tissues.  Restriction of fluid and salt intake.  Use of a water pill (diuretic). These medications are appropriate only for some types of edema. They pull fluid out of your body and make you urinate more often. This gets rid of fluid and reduces swelling, but diuretics can have side effects. Only use diuretics as directed by your health care provider. HOME CARE INSTRUCTIONS   Keep the affected body part above the level of your heart when you are lying down.   Do not sit still or stand for prolonged periods.   Do not put anything directly under your knees when lying down.  Do not wear constricting clothing or garters on your upper legs.   Exercise your legs to work the fluid back into your blood vessels. This may help the swelling go down.   Wear elastic bandages or support stockings to reduce ankle swelling as directed by your health care provider.   Eat a low-salt diet to reduce fluid if your health care provider recommends it.   Only take medicines as directed by your health care provider. SEEK MEDICAL CARE IF:   Your edema is not responding to treatment.  You have heart, liver, or kidney disease and notice symptoms of edema.  You have edema in your legs that does not improve after elevating them.   You have sudden and unexplained weight gain. SEEK IMMEDIATE MEDICAL CARE IF:   You develop shortness of breath or chest pain.   You cannot breathe when you lie  down.  You develop pain, redness, or warmth in the swollen areas.   You have heart, liver, or kidney disease and suddenly get edema.  You have a fever and your symptoms suddenly get worse. MAKE SURE YOU:   Understand these instructions.  Will watch your condition.  Will get help right away if you are not doing well or get worse.   This information is not intended to replace advice given to you by your health care provider. Make sure you discuss any questions you have with your health care provider.    Document Released: 10/13/2005 Document Revised: 11/03/2014 Document Reviewed: 08/05/2013 Elsevier Interactive Patient Education [employer].

## 2015-10-03 NOTE — ED Notes (Addendum)
According to the notes, pt had a dialysis graft placed in left arm in sept.  Reports swelling for " a while."  Pt saw a vascular specialist on dec 1st for swelling.  Pt reside of [facility] and staff reports swelling getting worse.  Pt says it is about the same.  Left arm is swollen, warm to touch,  Bruit and thrill present.  Pt says area above graft feels a little tender.  Radial pulse present.

## 2015-10-03 NOTE — ED Provider Notes (Signed)
CSN: [id number]     Arrival date & time 10/03/15  1611 History   First MD Initiated Contact with Patient 10/03/15 1812     Chief Complaint  Patient presents with  . Arm Swelling     (Consider location/radiation/quality/duration/timing/severity/associated sxs/prior Treatment) HPI Comments: Pt is a poor historian due to his MR. Pt is s/p left upper arm AV graft placement for his CKD. Pt resides at a nursing home and was sent to the Er for the arm swelling. Pt reports that the swelling has been present for 2-3 weeks. He denies any worsening. He has no pain. I called the nursing home and spoke with Wells Guiles, who is a tech there. Wells Guiles states that her and her "boss" lady sent pt to the ER as the arm looked worse to them. Wells Guiles last saw the patient 3 days ago, and she thinks that the swelling is worse, however pt has never complained of anything to her. Sister comes to the ER few hours into ER stay and reports that the swelling might be a little worse, but admits that the arm has had significant swelling for a while. No fevers, chills, n/v   ROS 10 Systems reviewed and are negative for acute change except as noted in the HPI.     The history is provided by the patient, the nursing home and medical records.    Past Medical History  Diagnosis Date  . Hypertension   . Diabetes mellitus   . Smoking   . Abnormal CT scan, kidney 10/06/2011  . UTI (lower urinary tract infection) 10/06/2011  . Bladder wall thickening 10/06/2011  . 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 Poor historian poor historian  . BPH (benign prostatic hypertrophy)   . Anxiety     mental retardation  . Hypothyroidism   . MR (mental retardation)   . Acute pyelonephritis 10/07/2011  . Hyperkalemia   . Renal insufficiency     chronic history  . Renal failure (ARF), acute on chronic (HCC)   . Anemia     normocytic  . Edema      history of lower extremity edema  . Hyperlipidemia   . Impaired speech    Past Surgical  History  Procedure Laterality Date  . Cystoscopy with urethral dilatation N/A 12/29/2013    Procedure: CYSTOSCOPY WITH URETHRAL DILATATION;  Surgeon: Marissa Nestle, MD;  Location: [facility];  Service: Urology;  Laterality: N/A;  . Transurethral resection of prostate N/A 01/04/2014    Procedure: TRANSURETHRAL RESECTION OF THE PROSTATE (TURP) (procedure #2);  Surgeon: Marissa Nestle, MD;  Location: [facility];  Service: Urology;  Laterality: N/A;  . Circumcision N/A 01/04/2014    Procedure: CIRCUMCISION ADULT (procedure #1);  Surgeon: Marissa Nestle, MD;  Location: [facility];  Service: Urology;  Laterality: N/A;  . Cystoscopy w/ retrogrades Bilateral 06/29/2015    Procedure: CYSTOSCOPY, DILATION OF URETHRAL STRICTURE WITH BILATERAL RETROGRADE PYELOGRAM,SUPRAPUBIC TUBE CHANGE;  Surgeon: Festus Aloe, MD;  Location: [facility];  Service: Urology;  Laterality: Bilateral;  . Av fistula placement Left 07/06/2015    Procedure:  INSERTION LEFT ARM ARTERIOVENOUS GORTEX GRAFT;  Surgeon: Angelia Mould, MD;  Location: [facility];  Service: Vascular;  Laterality: Left;   Family History  Problem Relation Age of Onset  . Cancer Mother    Social History  Substance Use Topics  . Smoking status: Never Smoker   . Smokeless tobacco: Never Used  . Alcohol Use: No     Comment: occ. use  Review of Systems    Allergies  Review of patient's allergies indicates no known allergies.  Home Medications   Prior to Admission medications   Medication Sig Start Date End Date Taking? Authorizing Provider  amoxicillin-clavulanate (AUGMENTIN) 500-125 MG tablet  09/26/15   Historical Provider, MD  carvedilol (COREG) 6.25 MG tablet Take 1 tablet (6.25 mg total) by mouth 2 (two) times daily with a meal. 03/08/15   [name], MD  ferrous sulfate 325 (65 FE) MG tablet Take 325 mg by mouth daily with breakfast.    Historical Provider, MD  HYDROcodone-acetaminophen (NORCO/VICODIN) 5-325 MG per tablet Take 1 tablet by  mouth every 6 (six) hours as needed for moderate pain. Patient not taking: Reported on 09/27/2015 07/06/15   Janalyn Harder Trinh, PA-C  isosorbide-hydrALAZINE (BIDIL) 20-37.5 MG per tablet Take 1 tablet by mouth 3 (three) times daily. Patient taking differently: Take 1 tablet by mouth 3 (three) times daily. 8am,2pm,8pm 03/08/15   [name], MD  levothyroxine (SYNTHROID, LEVOTHROID) 75 MCG tablet Take 75 mcg by mouth daily before breakfast.     Historical Provider, MD  linagliptin (TRADJENTA) 5 MG TABS tablet Take 5 mg by mouth daily.    Historical Provider, MD  niacin (NIASPAN) 1000 MG CR tablet Take 1,000 mg by mouth at bedtime.    Historical Provider, MD  nitrofurantoin (MACRODANTIN) 100 MG capsule Take 100 mg by mouth 2 (two) times daily.  06/11/15   Historical Provider, MD  omeprazole (PRILOSEC) 20 MG capsule Take 40 mg by mouth daily.    Historical Provider, MD  simvastatin (ZOCOR) 20 MG tablet Take 20 mg by mouth at bedtime.  02/28/15   Historical Provider, MD  sodium polystyrene (KAYEXALATE) 15 GM/60ML suspension Take 30 g by mouth. Daily on Monday and Friday    Historical Provider, MD  tamsulosin (FLOMAX) 0.4 MG CAPS capsule Take 0.4 mg by mouth daily.    Historical Provider, MD  torsemide (DEMADEX) 20 MG tablet Take 20 mg by mouth daily. Daily at 8am 06/13/15   Historical Provider, MD  Vitamin D, Ergocalciferol, (DRISDOL) 50000 UNITS CAPS capsule Take 1 capsule (50,000 Units total) by mouth every 7 (seven) days. Patient taking differently: Take 50,000 Units by mouth every 7 (seven) days. On Monday 03/08/15   [name], MD   BP 146/78 mmHg  Pulse 96  Temp(Src) 98 F (36.7 C) (Oral)  Resp 14  Ht 5\' 10"  (1.778 m)  Wt 198 lb (89.812 kg)  BMI 28.41 kg/m2  SpO2 100% Physical Exam  Constitutional: He is oriented to person, place, and time. He appears well-developed.  HENT:  Head: Atraumatic.  Neck: Neck supple.  Cardiovascular: Normal rate.   Pulmonary/Chest: Effort normal.    Musculoskeletal: He exhibits edema and tenderness.  Pt has significant swelling over the LUE. There is induration by the elbow on the antecubital side. Pt has a palpable thrill in the proximal arm. + erythema, but no warmth to touch. Compartments are still soft to palpation and gross sensory exam is normal. Pt's ROM over the elbow and wrist is grossly limited due to the edema. 2+ radial pulse. Skin is warm to touch.  Neurological: He is alert and oriented to person, place, and time.  Skin: Skin is warm. There is erythema.  Nursing note and vitals reviewed.       Indurated antecubital region       ED Course  Procedures (including critical care time) Labs Review Labs Reviewed - No data to display  Imaging  Review [place] Upper Ext Art Left Ltd  10/03/2015  CLINICAL DATA:  Pain and swelling at the patient's left upper extremity dialysis graft. Initial encounter. EXAM: LEFT UPPER EXTREMITY ARTERIAL DUPLEX SCAN TECHNIQUE: Gray-scale sonography as well as color Doppler and duplex ultrasound was performed to evaluate the patient's left upper extremity arteriovenous graft. COMPARISON:  None. FINDINGS: [name] left upper extremity arteriovenous graft appears fully patent. The proximal and distal anastomoses are grossly unremarkable in appearance. There is diffuse soft tissue edema noted along the left upper extremity, particularly prominent at the patient's more focal region of swelling. IMPRESSION: 1. Left upper extremity arteriovenous graft appears fully patent. 2. Diffuse soft tissue edema along the left upper extremity, particularly prominent at the more focal region of swelling. Electronically Signed   By: Garald Balding M.D.   On: 10/03/2015 20:38   I have personally reviewed and evaluated these images and lab results as part of my medical decision-making.   EKG Interpretation None        MDM   Final diagnoses:  Left arm swelling   Pt comes in with cc of L arm swelling s/p AV  graft placement on the same side back in September.  The LUE is quite swollen. The compartments are soft, and pt has warm skin, palpable pulse and no excruciating pain - we dont think he has compartment syndrome or critically ischemic limb at this time.  He has indurated antecubital fossa - not sure what ti make of it. The skin is slightly red as well. Pt is on augmentin bid currently - and i think the Vascular team has placed him on it presuming possibly mild cellulitis. We certainly dont think there is an abscess developing in the region based on the exam and the duration.  [place] was ordered from the triage - fistula is patent.  Spoke with Dr. Kellie Simmering, Vascular Surgery - just to be sure since it is hard to discern how bad the swelling was last week - when pt was seen in the clinic vs. Now - and Dr. Kellie Simmering reassured that such complications are not uncommon if the patient doesn't keep the arm elevated - which certainly appear to be the case. In any event, there is a plan in place for the patient to have clinic f/u next week to possibly get a fistulogram and r/o central vein stenosis - and i think it is appropriate we continue with that plan.  ER results and plan discussed with the patient and sister. They have been asked to conform an appt next week with Vascular surgery. Strict return precautions for severe pain and fevers, discussed. Elevation advised.     Varney Biles, MD 10/03/15 2120

## 2015-10-03 NOTE — ED Notes (Signed)
Spoke with Wells Guiles at [facility] who states that she is unsure what follow up that pt has scheduled at this time.  States that there is an appt on the calendar for him on Monday but she is not sure what this is for.  States that she sent the pt over to the ER due to swelling and redness of his left arm.

## 2015-10-08 ENCOUNTER — Inpatient Hospital Stay (HOSPITAL_COMMUNITY)
Admission: RE | Admit: 2015-10-08 | Discharge: 2015-10-11 | DRG: 252 | Disposition: A | Discharge status: Home / Self Care | Payer: Medicare Other | Source: Ambulatory Visit | Attending: Vascular Surgery | Admitting: Vascular Surgery

## 2015-10-08 ENCOUNTER — Encounter (HOSPITAL_COMMUNITY): Payer: Self-pay | Admitting: Vascular Surgery

## 2015-10-08 ENCOUNTER — Encounter (HOSPITAL_COMMUNITY)
Admission: RE | Disposition: A | Discharge status: Home / Self Care | Payer: Medicare Other | Source: Ambulatory Visit | Attending: Vascular Surgery

## 2015-10-08 DIAGNOSIS — E1122 Type 2 diabetes mellitus with diabetic chronic kidney disease: Secondary | ICD-10-CM | POA: Diagnosis present

## 2015-10-08 DIAGNOSIS — F79 Unspecified intellectual disabilities: Secondary | ICD-10-CM | POA: Diagnosis present

## 2015-10-08 DIAGNOSIS — F17210 Nicotine dependence, cigarettes, uncomplicated: Secondary | ICD-10-CM | POA: Diagnosis present

## 2015-10-08 DIAGNOSIS — M7989 Other specified soft tissue disorders: Secondary | ICD-10-CM | POA: Diagnosis not present

## 2015-10-08 DIAGNOSIS — D638 Anemia in other chronic diseases classified elsewhere: Secondary | ICD-10-CM | POA: Diagnosis present

## 2015-10-08 DIAGNOSIS — E039 Hypothyroidism, unspecified: Secondary | ICD-10-CM | POA: Diagnosis present

## 2015-10-08 DIAGNOSIS — F419 Anxiety disorder, unspecified: Secondary | ICD-10-CM | POA: Diagnosis present

## 2015-10-08 DIAGNOSIS — I120 Hypertensive chronic kidney disease with stage 5 chronic kidney disease or end stage renal disease: Secondary | ICD-10-CM | POA: Diagnosis not present

## 2015-10-08 DIAGNOSIS — T827XXA Infection and inflammatory reaction due to other cardiac and vascular devices, implants and grafts, initial encounter: Principal | ICD-10-CM | POA: Diagnosis present

## 2015-10-08 DIAGNOSIS — D62 Acute posthemorrhagic anemia: Secondary | ICD-10-CM | POA: Diagnosis not present

## 2015-10-08 DIAGNOSIS — T82898A Other specified complication of vascular prosthetic devices, implants and grafts, initial encounter: Secondary | ICD-10-CM | POA: Diagnosis not present

## 2015-10-08 DIAGNOSIS — D649 Anemia, unspecified: Secondary | ICD-10-CM | POA: Diagnosis not present

## 2015-10-08 DIAGNOSIS — Z95828 Presence of other vascular implants and grafts: Secondary | ICD-10-CM

## 2015-10-08 DIAGNOSIS — I898 Other specified noninfective disorders of lymphatic vessels and lymph nodes: Secondary | ICD-10-CM | POA: Diagnosis present

## 2015-10-08 DIAGNOSIS — E785 Hyperlipidemia, unspecified: Secondary | ICD-10-CM | POA: Diagnosis present

## 2015-10-08 DIAGNOSIS — Z992 Dependence on renal dialysis: Secondary | ICD-10-CM

## 2015-10-08 DIAGNOSIS — N139 Obstructive and reflux uropathy, unspecified: Secondary | ICD-10-CM | POA: Diagnosis present

## 2015-10-08 DIAGNOSIS — N186 End stage renal disease: Secondary | ICD-10-CM | POA: Diagnosis not present

## 2015-10-08 DIAGNOSIS — N400 Enlarged prostate without lower urinary tract symptoms: Secondary | ICD-10-CM | POA: Diagnosis present

## 2015-10-08 HISTORY — PX: PERIPHERAL VASCULAR CATHETERIZATION: SHX172C

## 2015-10-08 LAB — COMPREHENSIVE METABOLIC PANEL
ALT: 12 U/L — ABNORMAL LOW (ref 17–63)
AST: 16 U/L (ref 15–41)
Albumin: 2.8 g/dL — ABNORMAL LOW (ref 3.5–5.0)
Alkaline Phosphatase: 66 U/L (ref 38–126)
Anion gap: 8 (ref 5–15)
BUN: 34 mg/dL — ABNORMAL HIGH (ref 6–20)
CO2: 26 mmol/L (ref 22–32)
Calcium: 8.9 mg/dL (ref 8.9–10.3)
Chloride: 106 mmol/L (ref 101–111)
Creatinine, Ser: 2.85 mg/dL — ABNORMAL HIGH (ref 0.61–1.24)
GFR calc Af Amer: 25 mL/min — ABNORMAL LOW (ref >60)
GFR calc non Af Amer: 21 mL/min — ABNORMAL LOW (ref >60)
Glucose, Bld: 99 mg/dL (ref 65–99)
Potassium: 4.1 mmol/L (ref 3.5–5.1)
Sodium: 140 mmol/L (ref 135–145)
Total Bilirubin: 0.7 mg/dL (ref 0.3–1.2)
Total Protein: 6.3 g/dL — ABNORMAL LOW (ref 6.5–8.1)

## 2015-10-08 LAB — POCT I-STAT, CHEM 8
BUN: 35 mg/dL — ABNORMAL HIGH (ref 6–20)
Calcium, Ion: 1.24 mmol/L (ref 1.13–1.30)
Chloride: 102 mmol/L (ref 101–111)
Creatinine, Ser: 2.8 mg/dL — ABNORMAL HIGH (ref 0.61–1.24)
Glucose, Bld: 97 mg/dL (ref 65–99)
HCT: 26 % — ABNORMAL LOW (ref 39.0–52.0)
Hemoglobin: 8.8 g/dL — ABNORMAL LOW (ref 13.0–17.0)
Potassium: 4.1 mmol/L (ref 3.5–5.1)
Sodium: 140 mmol/L (ref 135–145)
TCO2: 29 mmol/L (ref 0–100)

## 2015-10-08 LAB — PROTIME-INR
INR: 1.23 (ref 0.00–1.49)
Prothrombin Time: 15.6 seconds — ABNORMAL HIGH (ref 11.6–15.2)

## 2015-10-08 LAB — CBC
HCT: 25.4 % — ABNORMAL LOW (ref 39.0–52.0)
Hemoglobin: 8.3 g/dL — ABNORMAL LOW (ref 13.0–17.0)
MCH: 31.8 pg (ref 26.0–34.0)
MCHC: 32.7 g/dL (ref 30.0–36.0)
MCV: 97.3 fL (ref 78.0–100.0)
Platelets: 204 10*3/uL (ref 150–400)
RBC: 2.61 MIL/uL — ABNORMAL LOW (ref 4.22–5.81)
RDW: 15.7 % — ABNORMAL HIGH (ref 11.5–15.5)
WBC: 6.5 10*3/uL (ref 4.0–10.5)

## 2015-10-08 SURGERY — A/V SHUNTOGRAM/FISTULAGRAM
Anesthesia: LOCAL

## 2015-10-08 MED ORDER — POTASSIUM CHLORIDE CRYS ER 20 MEQ PO TBCR: 20.0000 meq | EXTENDED_RELEASE_TABLET | Freq: Once | ORAL | Status: DC

## 2015-10-08 MED ORDER — NIACIN ER (ANTIHYPERLIPIDEMIC) 500 MG PO TBCR
1000.0000 mg | EXTENDED_RELEASE_TABLET | Freq: Every day | ORAL | Status: DC
  Administered 2015-10-08 – 2015-10-10 (×3): 1000 mg via ORAL
  Filled 2015-10-08 (×5): qty 2

## 2015-10-08 MED ORDER — PIPERACILLIN-TAZOBACTAM 3.375 G IVPB
3.3750 g | Freq: Three times a day (TID) | INTRAVENOUS | Status: DC
  Administered 2015-10-08 – 2015-10-10 (×5): 3.375 g via INTRAVENOUS
  Filled 2015-10-08 (×8): qty 50

## 2015-10-08 MED ORDER — SODIUM CHLORIDE 0.9 % IJ SOLN: 3.0000 mL | INTRAMUSCULAR | Status: DC | PRN

## 2015-10-08 MED ORDER — LEVOTHYROXINE SODIUM 75 MCG PO TABS
75.0000 ug | ORAL_TABLET | Freq: Every day | ORAL | Status: DC
  Administered 2015-10-08 – 2015-10-11 (×4): 75 ug via ORAL
  Filled 2015-10-08 (×4): qty 1

## 2015-10-08 MED ORDER — HEPARIN (PORCINE) IN NACL 2-0.9 UNIT/ML-% IJ SOLN
INTRAMUSCULAR | Status: AC
Start: 2015-10-08 — End: 2015-10-08
  Filled 2015-10-08: qty 1000

## 2015-10-08 MED ORDER — FERROUS SULFATE 325 (65 FE) MG PO TABS
325.0000 mg | ORAL_TABLET | Freq: Every day | ORAL | Status: DC
  Administered 2015-10-09 – 2015-10-11 (×3): 325 mg via ORAL
  Filled 2015-10-08 (×4): qty 1

## 2015-10-08 MED ORDER — TORSEMIDE 20 MG PO TABS
20.0000 mg | ORAL_TABLET | Freq: Every day | ORAL | Status: DC
  Administered 2015-10-08 – 2015-10-11 (×4): 20 mg via ORAL
  Filled 2015-10-08 (×4): qty 1

## 2015-10-08 MED ORDER — LIDOCAINE HCL (PF) 1 % IJ SOLN
INTRAMUSCULAR | Status: AC
  Filled 2015-10-08: qty 30

## 2015-10-08 MED ORDER — IODIXANOL 320 MG/ML IV SOLN
INTRAVENOUS | Status: DC | PRN
  Administered 2015-10-08: 40 mL via INTRAVENOUS

## 2015-10-08 MED ORDER — ONDANSETRON HCL 4 MG/2ML IJ SOLN
4.0000 mg | Freq: Four times a day (QID) | INTRAMUSCULAR | Status: DC | PRN
  Administered 2015-10-10: 4 mg via INTRAVENOUS
  Filled 2015-10-08: qty 2

## 2015-10-08 MED ORDER — SODIUM CHLORIDE 0.9 % IV SOLN
1750.0000 mg | Freq: Once | INTRAVENOUS | Status: AC
  Administered 2015-10-08: 1750 mg via INTRAVENOUS
  Filled 2015-10-08: qty 1750

## 2015-10-08 MED ORDER — TAMSULOSIN HCL 0.4 MG PO CAPS
0.4000 mg | ORAL_CAPSULE | Freq: Every day | ORAL | Status: DC
  Administered 2015-10-08 – 2015-10-11 (×3): 0.4 mg via ORAL
  Filled 2015-10-08 (×3): qty 1

## 2015-10-08 MED ORDER — ENOXAPARIN SODIUM 30 MG/0.3ML ~~LOC~~ SOLN
30.0000 mg | SUBCUTANEOUS | Status: DC
  Administered 2015-10-08 – 2015-10-10 (×2): 30 mg via SUBCUTANEOUS
  Filled 2015-10-08 (×3): qty 0.3

## 2015-10-08 MED ORDER — VITAMIN D (ERGOCALCIFEROL) 1.25 MG (50000 UNIT) PO CAPS
50000.0000 [IU] | ORAL_CAPSULE | ORAL | Status: DC
  Administered 2015-10-08: 50000 [IU] via ORAL
  Filled 2015-10-08: qty 1

## 2015-10-08 MED ORDER — PANTOPRAZOLE SODIUM 40 MG PO TBEC
40.0000 mg | DELAYED_RELEASE_TABLET | Freq: Every day | ORAL | Status: DC
  Administered 2015-10-08 – 2015-10-11 (×3): 40 mg via ORAL
  Filled 2015-10-08 (×3): qty 1

## 2015-10-08 MED ORDER — LIDOCAINE HCL (PF) 1 % IJ SOLN
INTRAMUSCULAR | Status: DC | PRN
  Administered 2015-10-08: 11:00:00

## 2015-10-08 MED ORDER — GUAIFENESIN-DM 100-10 MG/5ML PO SYRP: 15.0000 mL | ORAL_SOLUTION | ORAL | Status: DC | PRN

## 2015-10-08 MED ORDER — ISOSORB DINITRATE-HYDRALAZINE 20-37.5 MG PO TABS
1.0000 | ORAL_TABLET | Freq: Three times a day (TID) | ORAL | Status: DC
  Administered 2015-10-08 – 2015-10-11 (×8): 1 via ORAL
  Filled 2015-10-08 (×8): qty 1

## 2015-10-08 MED ORDER — ALUM & MAG HYDROXIDE-SIMETH 200-200-20 MG/5ML PO SUSP: 15.0000 mL | ORAL | Status: DC | PRN

## 2015-10-08 MED ORDER — HYDROCODONE-ACETAMINOPHEN 5-325 MG PO TABS: 1.0000 | ORAL_TABLET | Freq: Four times a day (QID) | ORAL | Status: DC | PRN

## 2015-10-08 MED ORDER — HYDRALAZINE HCL 20 MG/ML IJ SOLN: 5.0000 mg | INTRAMUSCULAR | Status: DC | PRN

## 2015-10-08 MED ORDER — LABETALOL HCL 5 MG/ML IV SOLN: 10.0000 mg | INTRAVENOUS | Status: DC | PRN

## 2015-10-08 MED ORDER — PIPERACILLIN-TAZOBACTAM 3.375 G IVPB 30 MIN
3.3750 g | Freq: Once | INTRAVENOUS | Status: AC
  Administered 2015-10-08: 3.375 g via INTRAVENOUS
  Filled 2015-10-08: qty 50

## 2015-10-08 MED ORDER — PANTOPRAZOLE SODIUM 40 MG PO TBEC: 40.0000 mg | DELAYED_RELEASE_TABLET | Freq: Every day | ORAL | Status: DC

## 2015-10-08 MED ORDER — CARVEDILOL 6.25 MG PO TABS
6.2500 mg | ORAL_TABLET | Freq: Two times a day (BID) | ORAL | Status: DC
  Administered 2015-10-08 – 2015-10-11 (×5): 6.25 mg via ORAL
  Filled 2015-10-08 (×5): qty 1

## 2015-10-08 MED ORDER — PHENOL 1.4 % MT LIQD: 1.0000 | OROMUCOSAL | Status: DC | PRN

## 2015-10-08 MED ORDER — LINAGLIPTIN 5 MG PO TABS
5.0000 mg | ORAL_TABLET | Freq: Every day | ORAL | Status: DC
  Administered 2015-10-08 – 2015-10-11 (×3): 5 mg via ORAL
  Filled 2015-10-08 (×3): qty 1

## 2015-10-08 MED ORDER — METOPROLOL TARTRATE 1 MG/ML IV SOLN: 2.0000 mg | INTRAVENOUS | Status: DC | PRN

## 2015-10-08 MED ORDER — SIMVASTATIN 20 MG PO TABS
20.0000 mg | ORAL_TABLET | Freq: Every day | ORAL | Status: DC
  Administered 2015-10-08 – 2015-10-10 (×3): 20 mg via ORAL
  Filled 2015-10-08 (×3): qty 1

## 2015-10-08 SURGICAL SUPPLY — 11 items
BAG SNAP BAND KOVER 36X36 (MISCELLANEOUS) ×2 IMPLANT
COVER DOME SNAP 22 D (MISCELLANEOUS) ×2 IMPLANT
COVER PRB 48X5XTLSCP FOLD TPE (BAG) ×1 IMPLANT
COVER PROBE 5X48 (BAG) ×2
KIT MICROINTRODUCER STIFF 5F (SHEATH) ×2 IMPLANT
PROTECTION STATION PRESSURIZED (MISCELLANEOUS) ×2
STATION PROTECTION PRESSURIZED (MISCELLANEOUS) ×1 IMPLANT
STOPCOCK MORSE 400PSI 3WAY (MISCELLANEOUS) ×2 IMPLANT
TRAY PV CATH (CUSTOM PROCEDURE TRAY) ×2 IMPLANT
TUBING CIL FLEX 10 FLL-RA (TUBING) ×2 IMPLANT
WIRE BENTSON .035X145CM (WIRE) ×2 IMPLANT

## 2015-10-08 NOTE — H&P (View-Only) (Signed)
Postoperative Access Visit   History of Present Illness  Zachary [name] is a 69 y.o. year old male patient of Dr. Scot Dock who is s/p left upper arm AV graft placed with a 4-7 mm PTFE graft on 07/06/2015.  He is not yet on dialysis. Zachary [name] is a resident of [facility] in [place]. He has a hx of mental retardation and DM. Dr. Scot Dock last saw pt on 08/08/15. At that time the graft was working well although he did have moderate left upper extremity swelling. Dr. Scot Dock wrote instructions for him to elevate his arm at the nursing facility. If this does not improve or worsens he may ultimately require a fistulogram to look for central venous stenosis which might be amenable to venoplasty. The pt was to return as needed.  He returns today at the request of Dr. Legrand Rams with reported swelling, induration, and tenderness at left upper arm. The pt denies fever or chills, denies cold sensation or numbness in his left hand. His oral temperature today is 99.1.  The nursing facility staff member with pt states she is not certain, but does not think his left arm has been elevated very much. He was started on Augmentin 500/125 yesterday, bid for 7 days.   For VQI Use Only  PRE-ADM LIVING: Nursing home  AMB STATUS: Ambulatory   Past Medical History  Diagnosis Date  . Hypertension   . Diabetes mellitus   . Smoking   . Abnormal CT scan, kidney 10/06/2011  . UTI (lower urinary tract infection) 10/06/2011  . Bladder wall thickening 10/06/2011  . 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 Poor historian poor historian  . BPH (benign prostatic hypertrophy)   . Anxiety     mental retardation  . Hypothyroidism   . MR (mental retardation)   . Acute pyelonephritis 10/07/2011  . Hyperkalemia   . Renal insufficiency     chronic history  . Renal failure (ARF), acute on chronic (HCC)   . Anemia     normocytic  . Edema      history of lower extremity edema  . Hyperlipidemia   .  Impaired speech     Social History   Social History  . Marital Status: Single    Spouse Name: N/A  . Number of Children: N/A  . Years of Education: N/A   Occupational History  . Not on file.   Social History Main Topics  . Smoking status: Never Smoker   . Smokeless tobacco: Never Used  . Alcohol Use: No     Comment: occ. use   . Drug Use: No  . Sexual Activity: No   Other Topics Concern  . Not on file   Social History Narrative   Lives at assisted living facility      Family History  Problem Relation Age of Onset  . Cancer Mother     Past Surgical History  Procedure Laterality Date  . Cystoscopy with urethral dilatation N/A 12/29/2013    Procedure: CYSTOSCOPY WITH URETHRAL DILATATION;  Surgeon: Marissa Nestle, MD;  Location: [facility];  Service: Urology;  Laterality: N/A;  . Transurethral resection of prostate N/A 01/04/2014    Procedure: TRANSURETHRAL RESECTION OF THE PROSTATE (TURP) (procedure #2);  Surgeon: Marissa Nestle, MD;  Location: [facility];  Service: Urology;  Laterality: N/A;  . Circumcision N/A 01/04/2014    Procedure: CIRCUMCISION ADULT (procedure #1);  Surgeon: Marissa Nestle, MD;  Location: [facility];  Service: Urology;  Laterality: N/A;  . Cystoscopy w/ retrogrades Bilateral 06/29/2015    Procedure: CYSTOSCOPY, DILATION OF URETHRAL STRICTURE WITH BILATERAL RETROGRADE PYELOGRAM,SUPRAPUBIC TUBE CHANGE;  Surgeon: Festus Aloe, MD;  Location: [facility];  Service: Urology;  Laterality: Bilateral;  . Av fistula placement Left 07/06/2015    Procedure:  INSERTION LEFT ARM ARTERIOVENOUS GORTEX GRAFT;  Surgeon: Angelia Mould, MD;  Location: [facility];  Service: Vascular;  Laterality: Left;    No Known Allergies   Current Outpatient Prescriptions on File Prior to Visit  Medication Sig Dispense Refill  . carvedilol (COREG) 6.25 MG tablet Take 1 tablet (6.25 mg total) by mouth 2 (two) times daily with a meal. 60 tablet 3  . ferrous sulfate 325 (65 FE) MG tablet  Take 325 mg by mouth daily with breakfast.    . isosorbide-hydrALAZINE (BIDIL) 20-37.5 MG per tablet Take 1 tablet by mouth 3 (three) times daily. (Patient taking differently: Take 1 tablet by mouth 3 (three) times daily. 8am,2pm,8pm) 90 tablet 3  . levothyroxine (SYNTHROID, LEVOTHROID) 75 MCG tablet Take 75 mcg by mouth daily before breakfast.     . linagliptin (TRADJENTA) 5 MG TABS tablet Take 5 mg by mouth daily.    . niacin (NIASPAN) 1000 MG CR tablet Take 1,000 mg by mouth at bedtime.    Marland Kitchen omeprazole (PRILOSEC) 20 MG capsule Take 40 mg by mouth daily.    . simvastatin (ZOCOR) 20 MG tablet Take 20 mg by mouth at bedtime.     . tamsulosin (FLOMAX) 0.4 MG CAPS capsule Take 0.4 mg by mouth daily.    Marland Kitchen torsemide (DEMADEX) 20 MG tablet Take 20 mg by mouth daily. Daily at 8am    . Vitamin D, Ergocalciferol, (DRISDOL) 50000 UNITS CAPS capsule Take 1 capsule (50,000 Units total) by mouth every 7 (seven) days. (Patient taking differently: Take 50,000 Units by mouth every 7 (seven) days. On Monday) 30 capsule 3  . HYDROcodone-acetaminophen (NORCO/VICODIN) 5-325 MG per tablet Take 1 tablet by mouth every 6 (six) hours as needed for moderate pain. (Patient not taking: Reported on 09/27/2015) 30 tablet 0  . nitrofurantoin (MACRODANTIN) 100 MG capsule Take 100 mg by mouth 2 (two) times daily.     . sodium polystyrene (KAYEXALATE) 15 GM/60ML suspension Take 30 g by mouth. Daily on Monday and Friday     No current facility-administered medications on file prior to visit.     REVIEW OF SYSTEMS: see HPI for pertinent positives and negatives    Physical Examination Filed Vitals:   09/27/15 0953  BP: 139/79  Pulse: 100  Temp: 99.1 F (37.3 C)  TempSrc: Oral  Resp: 18  Height: 5' 8.5" (1.74 m)  Weight: 198 lb (89.812 kg)  SpO2: 99%   Body mass index is 29.66 kg/(m^2).   General: The patient appears their stated age.   HEENT:  No gross abnormalities Pulmonary: Respirations are  non-labored Abdomen: Soft and non-tender. Musculoskeletal: There are no major deformities.   Neurologic: No focal weakness or paresthesias are detected. Skin: There are no ulcer or rashes noted. Psychiatric: The patient has normal affect, seems to answer questions appropriately. Cardiovascular: There is a regular rate and rhythm. Left arm and hand has 2-3+ non pitting edema, mild erythema at the left upper arm AVG.  Left UE: Incision is well healed, skin feels warm, hand grip is 4/5, sensation in digits is intact, faintly palpable thrill, bruit can be auscultated    Medical Decision Making  FINNLY BRIETZKE is a 69  y.o. year old male who is s/p left upper arm AV graft placed with a 4-7 mm PTFE graft on 07/06/2015.  He is not yet on dialysis. Zachary [name] is a resident of [facility] in [place]. Left fingers to upper arm has 2-3+ non pitting edema. Mild to moderate erythema over AVG site. No open wounds, no drainage. Dr. Oneida Alar spoke with pt and examined him. Will schedule shunto-gram with Dr. Scot Dock in a week, after pt has been on Augmentin for a week.   NICKEL, Sharmon Leyden, RN, MSN, FNP-C [facility] Office: [phone]  09/27/2015, 10:14 AM  Clinic MD: Oneida Alar

## 2015-10-08 NOTE — Progress Notes (Signed)
Pt alert and responsive. Denies pain. Pleasant.  Pt to have Left fistula removed tomorrow.  Pt stated he was advised of this.  Cannot reach legal guardian at this time.

## 2015-10-08 NOTE — Progress Notes (Signed)
Telemetry placed per MD order.  CMT notified by Cassell Smiles, RN & De Nurse, NT.

## 2015-10-08 NOTE — Op Note (Signed)
   PATIENT: Zachary [name]   MRN: [id number] DOB: Jan 20, 1946    DATE OF PROCEDURE: 10/08/2015  INDICATIONS: Zachary [name] is a 69 y.o. male who was seen in the office with a swollen left upper arm. He was set up for a fistulogram to rule out a central venous stenosis. On exam today he has increasing swelling at the incision consistent with possible infection. I plan to admit the patient for exploration of the graft and possible removal of the graft if it is infected. However I felt it would be useful to proceed with the fistulogram to rule out a central venous stenosis.  PROCEDURE:  1. Ultrasound-guided access to the left upper arm AV graft 2. Fistulogram left upper arm AV graft  SURGEON: Judeth Cornfield. Scot Dock, MD, FACS  ANESTHESIA: local   EBL: minimal  TECHNIQUE: The patient was taken to the peripheral vascular lab. The left upper extremity was prepped and draped in usual sterile fashion. Under GUIDANCE, AFTER THE SKIN WAS ANESTHETIZED, THE GRAFT WAS CANNULATED WITH A MICROPUNCTURE NEEDLE AND A MICROPUNCTURE SHEATH INTRODUCED OVER THE WIRE. FISTULOGRAM WAS THEN OBTAINED EVALUATING FROM THE CANNULATION SITE TO THE CENTRAL VEINS. NEXT PRESSURE WAS HELD OVER THE GRAFT TO ALLOW REFLUX INTO THE ARTERIAL ANASTOMOSIS TO EVALUATE THE ARTERIAL ANASTOMOSIS. AT THE COMPLETION A 3-0 NYLON SUTURE WAS PLACED AROUND THE CANNULATION SITE FOR HEMOSTASIS. NO IMMEDIATE COMPETITIONS WERE NOTED.  FINDINGS:  1. There is no central venous stenosis. 2. The graft and arterial anastomosis are open.  CLINICAL NOTE: The patient appears to have infection at the antecubital level. I have recommended that he be admitted, placed on IV antibiotics, and I will plan on exploring his graft tomorrow. If the graft is infected and have to be removed and he would require placement of a catheter later in the week. I have discussed the case with renal so that they can arrange dialysis.  [name], MD, FACS [facility]  DATE OF DICTATION:   10/08/2015

## 2015-10-08 NOTE — Interval H&P Note (Signed)
History and Physical Interval Note:  10/08/2015 10:03 AM  Zachary [name]  has presented today for surgery, with the diagnosis of left arm swelling  The various methods of treatment have been discussed with the patient and family. After consideration of risks, benefits and other options for treatment, the patient has consented to  Procedure(s): A/V Shuntogram (N/A) as a surgical intervention .  [name] history has been reviewed, patient examined, no change in status, stable for surgery.  I have reviewed the patient's chart and labs.  Questions were answered to the patient's satisfaction.     [name]

## 2015-10-08 NOTE — Consult Note (Signed)
Reason for Consult:CKD stage 4 Referring Physician: Scot Dock, MD  Zachary [name] is an 68 y.o. male.  HPI: Pt is a 69 yo AAM with PMH sig for DM, HTN, obstructive uropathy who presented to [facility] in November with N/V and AKI/CKD.  He was evaluated by his primary Nephrologist, Dr. Lowanda Foster and he was noted to have right sided hydronephrosis and possible pyelonephritis.  His Scr peaked at 5.24 but improved to 2.54 at time of discharge.  He was recently noted to have marked swelling and discomfort of his left arm by nurse post-operative visit on 09/27/15 and placed on Augmentin (had placement of Left upper arm AVG on 07/06/15).  He was admitted today by Dr. Scot Dock who performed a shuntogram which was negative for central stenosis and is planning to take him to the OR tomorrow for exploration of the AVG as infection is possible.  We were asked to evaluate the patient in case he required hemodialysis during this hospitalization, however his Scr is below his baseline.  The trend in Scr is seen below.   Presently patient denies any difficulty in breathing. His appetite is good  Trend in Creatinine: CREATININE, SER  Date/Time Value Ref Range Status  10/08/2015 09:49 AM 2.80* 0.61 - 1.24 mg/dL Final  09/18/2015 02:00 AM 2.54* 0.61 - 1.24 mg/dL Final  09/17/2015 05:55 AM 2.95* 0.61 - 1.24 mg/dL Final  09/16/2015 05:48 AM 3.85* 0.61 - 1.24 mg/dL Final  09/15/2015 09:19 AM 5.24* 0.61 - 1.24 mg/dL Final  09/14/2015 05:58 AM 5.00* 0.61 - 1.24 mg/dL Final  09/13/2015 02:42 PM 4.76* 0.61 - 1.24 mg/dL Final  07/23/2015 12:19 AM 2.74* 0.61 - 1.24 mg/dL Final  07/22/2015 06:12 AM 3.04* 0.61 - 1.24 mg/dL Final  07/21/2015 05:04 AM 3.02* 0.61 - 1.24 mg/dL Final  07/20/2015 06:21 PM 3.82* 0.61 - 1.24 mg/dL Final  07/13/2015 06:56 AM 2.30* 0.61 - 1.24 mg/dL Final  07/12/2015 06:19 AM 2.53* 0.61 - 1.24 mg/dL Final  07/10/2015 06:34 AM 3.68* 0.61 - 1.24 mg/dL Final  07/09/2015 06:47 AM 3.61* 0.61 - 1.24 mg/dL Final   07/08/2015 12:05 PM 4.87* 0.61 - 1.24 mg/dL Final  07/07/2015 11:20 AM 4.37* 0.61 - 1.24 mg/dL Final  06/29/2015 12:25 PM 3.84* 0.61 - 1.24 mg/dL Final  05/29/2015 11:00 AM 4.41* 0.61 - 1.24 mg/dL Final  05/25/2015 04:33 AM 3.02* 0.61 - 1.24 mg/dL Final  05/24/2015 05:21 AM 3.17* 0.61 - 1.24 mg/dL Final  05/23/2015 05:19 AM 3.32* 0.61 - 1.24 mg/dL Final  05/22/2015 06:40 PM 3.24* 0.61 - 1.24 mg/dL Final  05/22/2015 03:01 PM 3.31* 0.61 - 1.24 mg/dL Final  05/22/2015 09:42 AM 3.39* 0.61 - 1.24 mg/dL Final  04/23/2015 05:35 AM 3.14* 0.61 - 1.24 mg/dL Final  04/22/2015 06:08 AM 3.23* 0.61 - 1.24 mg/dL Final  04/21/2015 06:06 AM 3.61* 0.61 - 1.24 mg/dL Final  04/20/2015 05:30 AM 4.25* 0.61 - 1.24 mg/dL Final  04/19/2015 04:56 AM 5.55* 0.61 - 1.24 mg/dL Final  04/18/2015 05:07 AM 6.34* 0.61 - 1.24 mg/dL Final  04/17/2015 04:36 AM 6.87* 0.61 - 1.24 mg/dL Final  04/16/2015 05:25 PM 6.67* 0.61 - 1.24 mg/dL Final  03/08/2015 07:46 AM 3.47* 0.61 - 1.24 mg/dL Final  03/07/2015 06:12 AM 3.46* 0.61 - 1.24 mg/dL Final  03/06/2015 06:41 AM 3.67* 0.61 - 1.24 mg/dL Final  03/05/2015 05:53 AM 4.01* 0.61 - 1.24 mg/dL Final  03/04/2015 05:50 AM 4.23* 0.61 - 1.24 mg/dL Final  03/03/2015 06:22 AM 4.03* 0.61 - 1.24 mg/dL Final  03/02/2015  01:33 PM 3.69* 0.61 - 1.24 mg/dL Final  01/06/2014 04:55 AM 1.68* 0.50 - 1.35 mg/dL Final  01/05/2014 08:39 AM 1.65* 0.50 - 1.35 mg/dL Final  01/03/2014 07:49 AM 1.67* 0.50 - 1.35 mg/dL Final  01/01/2014 06:01 AM 1.82* 0.50 - 1.35 mg/dL Final  12/31/2013 05:55 AM 1.86* 0.50 - 1.35 mg/dL Final  12/30/2013 04:53 AM 1.71* 0.50 - 1.35 mg/dL Final  12/28/2013 05:21 AM 1.75* 0.50 - 1.35 mg/dL Final  12/27/2013 05:06 AM 1.95* 0.50 - 1.35 mg/dL Final  12/26/2013 05:44 AM 2.35* 0.50 - 1.35 mg/dL Final  12/25/2013 04:57 PM 3.30* 0.50 - 1.35 mg/dL Final  12/25/2013 04:19 PM 3.23* 0.50 - 1.35 mg/dL Final  10/08/2011 05:23 AM 1.17 0.50 - 1.35 mg/dL Final    PMH:   Past Medical  History  Diagnosis Date  . Hypertension   . Diabetes mellitus   . Smoking   . Abnormal CT scan, kidney 10/06/2011  . UTI (lower urinary tract infection) 10/06/2011  . Bladder wall thickening 10/06/2011  . 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 Poor historian poor historian  . BPH (benign prostatic hypertrophy)   . Anxiety     mental retardation  . Hypothyroidism   . MR (mental retardation)   . Acute pyelonephritis 10/07/2011  . Hyperkalemia   . Renal insufficiency     chronic history  . Renal failure (ARF), acute on chronic (HCC)   . Anemia     normocytic  . Edema      history of lower extremity edema  . Hyperlipidemia   . Impaired speech     PSH:   Past Surgical History  Procedure Laterality Date  . Cystoscopy with urethral dilatation N/A 12/29/2013    Procedure: CYSTOSCOPY WITH URETHRAL DILATATION;  Surgeon: Marissa Nestle, MD;  Location: [facility];  Service: Urology;  Laterality: N/A;  . Transurethral resection of prostate N/A 01/04/2014    Procedure: TRANSURETHRAL RESECTION OF THE PROSTATE (TURP) (procedure #2);  Surgeon: Marissa Nestle, MD;  Location: [facility];  Service: Urology;  Laterality: N/A;  . Circumcision N/A 01/04/2014    Procedure: CIRCUMCISION ADULT (procedure #1);  Surgeon: Marissa Nestle, MD;  Location: [facility];  Service: Urology;  Laterality: N/A;  . Cystoscopy w/ retrogrades Bilateral 06/29/2015    Procedure: CYSTOSCOPY, DILATION OF URETHRAL STRICTURE WITH BILATERAL RETROGRADE PYELOGRAM,SUPRAPUBIC TUBE CHANGE;  Surgeon: Festus Aloe, MD;  Location: [facility];  Service: Urology;  Laterality: Bilateral;  . Av fistula placement Left 07/06/2015    Procedure:  INSERTION LEFT ARM ARTERIOVENOUS GORTEX GRAFT;  Surgeon: Angelia Mould, MD;  Location: [place];  Service: Vascular;  Laterality: Left;  . Peripheral vascular catheterization N/A 10/08/2015    Procedure: A/V Shuntogram;  Surgeon: Angelia Mould, MD;  Location: [facility];  Service:  Cardiovascular;  Laterality: N/A;    Allergies: No Known Allergies  Medications:   Prior to Admission medications   Medication Sig Start Date End Date Taking? Authorizing Provider  carvedilol (COREG) 6.25 MG tablet Take 1 tablet (6.25 mg total) by mouth 2 (two) times daily with a meal. 03/08/15  Yes [name], MD  ferrous sulfate 325 (65 FE) MG tablet Take 325 mg by mouth daily with breakfast.   Yes Historical Provider, MD  isosorbide-hydrALAZINE (BIDIL) 20-37.5 MG per tablet Take 1 tablet by mouth 3 (three) times daily. Patient taking differently: Take 1 tablet by mouth 3 (three) times daily. 8am,2pm,8pm 03/08/15  Yes [name], MD  levothyroxine (SYNTHROID, LEVOTHROID) 75 MCG  tablet Take 75 mcg by mouth daily before breakfast.    Yes Historical Provider, MD  linagliptin (TRADJENTA) 5 MG TABS tablet Take 5 mg by mouth daily.   Yes Historical Provider, MD  niacin (NIASPAN) 1000 MG CR tablet Take 1,000 mg by mouth at bedtime.   Yes Historical Provider, MD  omeprazole (PRILOSEC) 20 MG capsule Take 40 mg by mouth daily.   Yes Historical Provider, MD  simvastatin (ZOCOR) 20 MG tablet Take 20 mg by mouth at bedtime.  02/28/15  Yes Historical Provider, MD  sodium polystyrene (KAYEXALATE) 15 GM/60ML suspension Take 30 g by mouth. Daily on Monday and Friday   Yes Historical Provider, MD  tamsulosin (FLOMAX) 0.4 MG CAPS capsule Take 0.4 mg by mouth daily.   Yes Historical Provider, MD  torsemide (DEMADEX) 20 MG tablet Take 20 mg by mouth daily. Daily at 8am 06/13/15  Yes Historical Provider, MD  Vitamin D, Ergocalciferol, (DRISDOL) 50000 UNITS CAPS capsule Take 1 capsule (50,000 Units total) by mouth every 7 (seven) days. Patient taking differently: Take 50,000 Units by mouth every 7 (seven) days. On Monday 03/08/15  Yes [name], MD  HYDROcodone-acetaminophen (NORCO/VICODIN) 5-325 MG per tablet Take 1 tablet by mouth every 6 (six) hours as needed for moderate pain. Patient not taking: Reported  on 09/27/2015 07/06/15   Alvia Grove, PA-C    Inpatient medications: . carvedilol  6.25 mg Oral BID WC  . enoxaparin (LOVENOX) injection  30 mg Subcutaneous Q24H  . [START ON 10/09/2015] ferrous sulfate  325 mg Oral Q breakfast  . isosorbide-hydrALAZINE  1 tablet Oral TID  . levothyroxine  75 mcg Oral QAC breakfast  . linagliptin  5 mg Oral Daily  . niacin  1,000 mg Oral QHS  . pantoprazole  40 mg Oral Daily  . potassium chloride  20-40 mEq Oral Once  . simvastatin  20 mg Oral QHS  . tamsulosin  0.4 mg Oral Daily  . torsemide  20 mg Oral Daily  . Vitamin D (Ergocalciferol)  50,000 Units Oral Q7 days    Discontinued Meds:   Medications Discontinued During This Encounter  Medication Reason  . amoxicillin-clavulanate (AUGMENTIN) 500-125 MG tablet Completed Course  . nitrofurantoin (MACRODANTIN) 100 MG capsule Completed Course  . iodixanol (VISIPAQUE) 320 MG/ML injection Patient Discharge  . heparin 500 mL, lidocaine (PF) (XYLOCAINE) 1 % 1 mL Patient Discharge  . sodium chloride 0.9 % injection 3 mL Patient Transfer  . pantoprazole (PROTONIX) EC tablet 40 mg Duplicate    Social History:  reports that he has never smoked. He has never used smokeless tobacco. He reports that he does not drink alcohol or use illicit drugs.  Family History:   Family History  Problem Relation Age of Onset  . Cancer Mother     Pertinent items are noted in HPI. Weight change:  No intake or output data in the 24 hours ending 10/08/15 1148 BP 174/87 mmHg  Pulse 98  Temp(Src) 98 F (36.7 C) (Oral)  Resp 16  Ht 5\' 8"  (1.727 m)  Wt 89.812 kg (198 lb)  BMI 30.11 kg/m2  SpO2 100% Filed Vitals:   10/08/15 1035 10/08/15 1040 10/08/15 1045 10/08/15 1108  BP:    174/87  Pulse: 0 0 0 98  Temp:    98 F (36.7 C)  TempSrc:    Oral  Resp: 0 0 0 16  Height:      Weight:      SpO2: 0% 0% 0% 100%  General appearance: alert, cooperative and no distress Head: Normocephalic, without obvious  abnormality, atraumatic Eyes: negative findings: lids and lashes normal, conjunctivae and sclerae normal and corneas clear Neck: no adenopathy, no carotid bruit, supple, symmetrical, trachea midline and thyroid not enlarged, symmetric, no tenderness/mass/nodules Resp: clear to auscultation bilaterally Cardio: regular rate and rhythm and no rub GI: soft, non-tender; bowel sounds normal; no masses,  no organomegaly Extremities: edema Left upper extremity and L AVG +T/B but with fluctuant area at arterial anastamosis  Labs: Basic Metabolic Panel:  Recent Labs Lab 10/08/15 0949  NA 140  K 4.1  CL 102  GLUCOSE 97  BUN 35*  CREATININE 2.80*   Liver Function Tests: No results for input(s): AST, ALT, ALKPHOS, BILITOT, PROT, ALBUMIN in the last 168 hours. No results for input(s): LIPASE, AMYLASE in the last 168 hours. No results for input(s): AMMONIA in the last 168 hours. CBC:  Recent Labs Lab 10/08/15 0949  HGB 8.8*  HCT 26.0*   PT/INR: @LABRCNTIP (inr:5) Cardiac Enzymes: )No results for input(s): CKTOTAL, CKMB, CKMBINDEX, TROPONINI in the last 168 hours. CBG: No results for input(s): GLUCAP in the last 168 hours.  Iron Studies: No results for input(s): IRON, TIBC, TRANSFERRIN, FERRITIN in the last 168 hours.  Xrays/Other Studies: No results found.   Assessment/Plan: 1.  Complication of Vascular access- his left arm is markedly edematous, warm, and tender.  No evidence of central stenosis by shuntogram.  Plan for exploration in OR tomorrow by Dr. Scot Dock 2. CKD stage 4- Scr is below baseline.  No indication for dialysis at this time and will continue to follow. 3. Anemia of chronic disease- will check iron stores and initiate ESA if not already receiving as an outpt in [place]. 4. HTN- continue with meds. 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 Hypothyroidism- cont with replacement therapy.    Isaish Alemu A 10/08/2015, 11:48 AM

## 2015-10-08 NOTE — Progress Notes (Signed)
Attempted to call pt legal guardian [name] [phone] x [phone].  No answer

## 2015-10-08 NOTE — Progress Notes (Signed)
ANTIBIOTIC CONSULT NOTE - INITIAL  Pharmacy Consult for zosyn, vancomycin Indication: wound infection  No Known Allergies  Patient Measurements: Height: 5\' 8"  (172.7 cm) Weight: 198 lb (89.812 kg) IBW/kg (Calculated) : 68.4  Vital Signs: Temp: 98 F (36.7 C) (12/12 1108) Temp Source: Oral (12/12 1108) BP: 174/87 mmHg (12/12 1108) Pulse Rate: 98 (12/12 1108) Intake/Output from previous day:   Intake/Output from this shift:    Labs:  Recent Labs  10/08/15 0949  HGB 8.8*  CREATININE 2.80*   Estimated Creatinine Clearance: 27.9 mL/min (by C-G formula based on Cr of 2.8). No results for input(s): VANCOTROUGH, VANCOPEAK, VANCORANDOM, GENTTROUGH, GENTPEAK, GENTRANDOM, TOBRATROUGH, TOBRAPEAK, TOBRARND, AMIKACINPEAK, AMIKACINTROU, AMIKACIN in the last 72 hours.   Microbiology: Recent Results (from the past 720 hour(s))  Urine culture     Status: None   Collection Time: 09/13/15  2:31 PM  Result Value Ref Range Status   Specimen Description URINE, CLEAN CATCH  Final   Special Requests NONE  Final   Culture   Final    NO GROWTH 1 DAY Performed at [facility]    Report Status 09/15/2015 FINAL  Final    Medical History: Past Medical History  Diagnosis Date  . Hypertension   . Diabetes mellitus   . Smoking   . Abnormal CT scan, kidney 10/06/2011  . UTI (lower urinary tract infection) 10/06/2011  . Bladder wall thickening 10/06/2011  . 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 Poor historian poor historian  . BPH (benign prostatic hypertrophy)   . Anxiety     mental retardation  . Hypothyroidism   . MR (mental retardation)   . Acute pyelonephritis 10/07/2011  . Hyperkalemia   . Renal insufficiency     chronic history  . Renal failure (ARF), acute on chronic (HCC)   . Anemia     normocytic  . Edema      history of lower extremity edema  . Hyperlipidemia   . Impaired speech     Medications:  Prescriptions prior to admission  Medication Sig Dispense Refill  Last Dose  . carvedilol (COREG) 6.25 MG tablet Take 1 tablet (6.25 mg total) by mouth 2 (two) times daily with a meal. 60 tablet 3 10/07/2015 at 2200  . ferrous sulfate 325 (65 FE) MG tablet Take 325 mg by mouth daily with breakfast.   10/07/2015 at 0800  . isosorbide-hydrALAZINE (BIDIL) 20-37.5 MG per tablet Take 1 tablet by mouth 3 (three) times daily. (Patient taking differently: Take 1 tablet by mouth 3 (three) times daily. 8am,2pm,8pm) 90 tablet 3 10/07/2015 at 2200  . levothyroxine (SYNTHROID, LEVOTHROID) 75 MCG tablet Take 75 mcg by mouth daily before breakfast.    10/07/2015 at 0800  . linagliptin (TRADJENTA) 5 MG TABS tablet Take 5 mg by mouth daily.   10/07/2015 at 0800  . niacin (NIASPAN) 1000 MG CR tablet Take 1,000 mg by mouth at bedtime.   10/07/2015 at 2200  . omeprazole (PRILOSEC) 20 MG capsule Take 40 mg by mouth daily.   10/07/2015 at 0800  . simvastatin (ZOCOR) 20 MG tablet Take 20 mg by mouth at bedtime.    10/07/2015 at Unknown time  . sodium polystyrene (KAYEXALATE) 15 GM/60ML suspension Take 30 g by mouth. Daily on Monday and Friday   10/05/2015  . tamsulosin (FLOMAX) 0.4 MG CAPS capsule Take 0.4 mg by mouth daily.   10/07/2015 at 0800  . torsemide (DEMADEX) 20 MG tablet Take 20 mg by mouth daily. Daily at 8am  10/07/2015 at 0800  . Vitamin D, Ergocalciferol, (DRISDOL) 50000 UNITS CAPS capsule Take 1 capsule (50,000 Units total) by mouth every 7 (seven) days. (Patient taking differently: Take 50,000 Units by mouth every 7 (seven) days. On Monday) 30 capsule 3 10/01/2015  . HYDROcodone-acetaminophen (NORCO/VICODIN) 5-325 MG per tablet Take 1 tablet by mouth every 6 (six) hours as needed for moderate pain. (Patient not taking: Reported on 09/27/2015) 30 tablet 0 Not Taking   Scheduled:  . carvedilol  6.25 mg Oral BID WC  . enoxaparin (LOVENOX) injection  30 mg Subcutaneous Q24H  . [START ON 10/09/2015] ferrous sulfate  325 mg Oral Q breakfast  . isosorbide-hydrALAZINE  1 tablet  Oral TID  . levothyroxine  75 mcg Oral QAC breakfast  . linagliptin  5 mg Oral Daily  . niacin  1,000 mg Oral QHS  . pantoprazole  40 mg Oral Daily  . potassium chloride  20-40 mEq Oral Once  . simvastatin  20 mg Oral QHS  . tamsulosin  0.4 mg Oral Daily  . torsemide  20 mg Oral Daily  . Vitamin D (Ergocalciferol)  50,000 Units Oral Q7 days    Assessment: 69 yo male with CKD and  here with a swollen left upper arm now s/p fistulogram (HD graft placed in Sept). Pharmacy has been consulted to dose vancomycin and zosyn for possible graft infection. WBC= 8.8, afebrile, SCr= 2.8 (2.54- 5.24 in the last 3 weeks). Family states he is not on HD as outpatient.  12/12 zosyn>> 12/12 vanc>>  Goal of Therapy:  Vancomycin trough level 10-15 mcg/ml   Plan -Vancomycin 1750mg  IV x1 and further dosing based on renal function trend -Zosyn 3.375gm IV q8h -Will follow renal function and clinical progress  Hildred Laser, Pharm D 10/08/2015 11:46 AM     Plan:

## 2015-10-09 ENCOUNTER — Encounter (HOSPITAL_COMMUNITY)
Admission: RE | Disposition: A | Discharge status: Home / Self Care | Payer: Self-pay | Source: Ambulatory Visit | Attending: Vascular Surgery

## 2015-10-09 ENCOUNTER — Encounter (HOSPITAL_COMMUNITY): Payer: Self-pay | Admitting: Certified Registered Nurse Anesthetist

## 2015-10-09 ENCOUNTER — Inpatient Hospital Stay (HOSPITAL_COMMUNITY): Payer: Medicare Other | Admitting: Anesthesiology

## 2015-10-09 HISTORY — PX: AVGG REMOVAL: SHX5153

## 2015-10-09 LAB — RENAL FUNCTION PANEL
Albumin: 2.4 g/dL — ABNORMAL LOW (ref 3.5–5.0)
Anion gap: 8 (ref 5–15)
BUN: 35 mg/dL — ABNORMAL HIGH (ref 6–20)
CO2: 27 mmol/L (ref 22–32)
Calcium: 8.7 mg/dL — ABNORMAL LOW (ref 8.9–10.3)
Chloride: 107 mmol/L (ref 101–111)
Creatinine, Ser: 3.18 mg/dL — ABNORMAL HIGH (ref 0.61–1.24)
GFR calc Af Amer: 22 mL/min — ABNORMAL LOW (ref >60)
GFR calc non Af Amer: 19 mL/min — ABNORMAL LOW (ref >60)
Glucose, Bld: 98 mg/dL (ref 65–99)
Phosphorus: 3.2 mg/dL (ref 2.5–4.6)
Potassium: 4.2 mmol/L (ref 3.5–5.1)
Sodium: 142 mmol/L (ref 135–145)

## 2015-10-09 LAB — CBC
HCT: 24.5 % — ABNORMAL LOW (ref 39.0–52.0)
Hemoglobin: 7.9 g/dL — ABNORMAL LOW (ref 13.0–17.0)
MCH: 31.5 pg (ref 26.0–34.0)
MCHC: 32.2 g/dL (ref 30.0–36.0)
MCV: 97.6 fL (ref 78.0–100.0)
Platelets: 191 10*3/uL (ref 150–400)
RBC: 2.51 MIL/uL — ABNORMAL LOW (ref 4.22–5.81)
RDW: 15.8 % — ABNORMAL HIGH (ref 11.5–15.5)
WBC: 6.8 10*3/uL (ref 4.0–10.5)

## 2015-10-09 LAB — IRON AND TIBC
Iron: 29 ug/dL — ABNORMAL LOW (ref 45–182)
Saturation Ratios: 18 % (ref 17.9–39.5)
TIBC: 157 ug/dL — ABNORMAL LOW (ref 250–450)
UIBC: 128 ug/dL

## 2015-10-09 LAB — GLUCOSE, CAPILLARY: Glucose-Capillary: 125 mg/dL — ABNORMAL HIGH (ref 65–99)

## 2015-10-09 LAB — FERRITIN: Ferritin: 254 ng/mL (ref 24–336)

## 2015-10-09 SURGERY — REMOVAL OF ARTERIOVENOUS GORETEX GRAFT (AVGG)
Anesthesia: General | Site: Arm Upper | Laterality: Left

## 2015-10-09 MED ORDER — PHENYLEPHRINE HCL 10 MG/ML IJ SOLN
INTRAMUSCULAR | Status: AC
  Filled 2015-10-09: qty 1

## 2015-10-09 MED ORDER — FENTANYL CITRATE (PF) 100 MCG/2ML IJ SOLN
INTRAMUSCULAR | Status: DC | PRN
  Administered 2015-10-09: 25 ug via INTRAVENOUS
  Administered 2015-10-09: 50 ug via INTRAVENOUS
  Administered 2015-10-09: 25 ug via INTRAVENOUS
  Administered 2015-10-09: 50 ug via INTRAVENOUS

## 2015-10-09 MED ORDER — VANCOMYCIN HCL IN DEXTROSE 1-5 GM/200ML-% IV SOLN
1000.0000 mg | INTRAVENOUS | Status: DC
  Administered 2015-10-10: 1000 mg via INTRAVENOUS
  Filled 2015-10-09: qty 200

## 2015-10-09 MED ORDER — DEXTROSE 5 % IV SOLN
INTRAVENOUS | Status: AC
  Administered 2015-10-09: 1.5 g via INTRAVENOUS
  Filled 2015-10-09: qty 1.5

## 2015-10-09 MED ORDER — NEOSTIGMINE METHYLSULFATE 10 MG/10ML IV SOLN
INTRAVENOUS | Status: DC | PRN
  Administered 2015-10-09: 4 mg via INTRAVENOUS

## 2015-10-09 MED ORDER — PROTAMINE SULFATE 10 MG/ML IV SOLN
INTRAVENOUS | Status: DC | PRN
  Administered 2015-10-09: 20 mg via INTRAVENOUS
  Administered 2015-10-09: 40 mg via INTRAVENOUS

## 2015-10-09 MED ORDER — HEMOSTATIC AGENTS (NO CHARGE) OPTIME
TOPICAL | Status: DC | PRN
  Administered 2015-10-09: 1 via TOPICAL

## 2015-10-09 MED ORDER — SODIUM CHLORIDE 0.9 % IV SOLN
20.0000 ug | INTRAVENOUS | Status: AC
  Administered 2015-10-09: 20 ug via INTRAVENOUS
  Filled 2015-10-09: qty 5

## 2015-10-09 MED ORDER — LIDOCAINE HCL (PF) 1 % IJ SOLN
INTRAMUSCULAR | Status: AC
  Filled 2015-10-09: qty 30

## 2015-10-09 MED ORDER — THROMBIN 20000 UNITS EX SOLR
CUTANEOUS | Status: AC
  Filled 2015-10-09: qty 20000

## 2015-10-09 MED ORDER — CALCIUM CHLORIDE 10 % IV SOLN
INTRAVENOUS | Status: DC | PRN
  Administered 2015-10-09 (×2): 200 mg via INTRAVENOUS
  Administered 2015-10-09: 100 mg via INTRAVENOUS

## 2015-10-09 MED ORDER — FENTANYL CITRATE (PF) 250 MCG/5ML IJ SOLN
INTRAMUSCULAR | Status: AC
  Filled 2015-10-09: qty 5

## 2015-10-09 MED ORDER — HEPARIN SODIUM (PORCINE) 1000 UNIT/ML IJ SOLN
INTRAMUSCULAR | Status: AC
  Filled 2015-10-09: qty 1

## 2015-10-09 MED ORDER — DEXTROSE 5 % IV SOLN
10.0000 mg | INTRAVENOUS | Status: DC | PRN
  Administered 2015-10-09: 20 ug/min via INTRAVENOUS

## 2015-10-09 MED ORDER — LIDOCAINE HCL (CARDIAC) 20 MG/ML IV SOLN
INTRAVENOUS | Status: DC | PRN
  Administered 2015-10-09: 60 mg via INTRAVENOUS

## 2015-10-09 MED ORDER — FENTANYL CITRATE (PF) 100 MCG/2ML IJ SOLN: 25.0000 ug | INTRAMUSCULAR | Status: DC | PRN

## 2015-10-09 MED ORDER — HEPARIN SODIUM (PORCINE) 1000 UNIT/ML IJ SOLN: INTRAMUSCULAR | Status: DC | PRN

## 2015-10-09 MED ORDER — SODIUM CHLORIDE 0.9 % IV SOLN
INTRAVENOUS | Status: DC | PRN
  Administered 2015-10-09: 11:00:00

## 2015-10-09 MED ORDER — GLYCOPYRROLATE 0.2 MG/ML IJ SOLN
INTRAMUSCULAR | Status: DC | PRN
  Administered 2015-10-09: 0.6 mg via INTRAVENOUS

## 2015-10-09 MED ORDER — ROCURONIUM BROMIDE 100 MG/10ML IV SOLN
INTRAVENOUS | Status: DC | PRN
  Administered 2015-10-09: 30 mg via INTRAVENOUS

## 2015-10-09 MED ORDER — HEPARIN SODIUM (PORCINE) 1000 UNIT/ML IJ SOLN
INTRAMUSCULAR | Status: DC | PRN
  Administered 2015-10-09: 7000 [IU] via INTRAVENOUS

## 2015-10-09 MED ORDER — HYDROCODONE-ACETAMINOPHEN 5-325 MG PO TABS: 1.0000 | ORAL_TABLET | ORAL | Status: DC | PRN

## 2015-10-09 MED ORDER — SODIUM CHLORIDE 0.9 % IV SOLN
INTRAVENOUS | Status: DC | PRN
  Administered 2015-10-09 (×2): via INTRAVENOUS

## 2015-10-09 MED ORDER — ONDANSETRON HCL 4 MG/2ML IJ SOLN
INTRAMUSCULAR | Status: AC
  Filled 2015-10-09: qty 2

## 2015-10-09 MED ORDER — 0.9 % SODIUM CHLORIDE (POUR BTL) OPTIME
TOPICAL | Status: DC | PRN
  Administered 2015-10-09: 1000 mL

## 2015-10-09 MED ORDER — PHENYLEPHRINE HCL 10 MG/ML IJ SOLN
INTRAMUSCULAR | Status: DC | PRN
  Administered 2015-10-09: 80 ug via INTRAVENOUS
  Administered 2015-10-09: 40 ug via INTRAVENOUS
  Administered 2015-10-09: 80 ug via INTRAVENOUS
  Administered 2015-10-09 (×4): 120 ug via INTRAVENOUS
  Administered 2015-10-09: 40 ug via INTRAVENOUS

## 2015-10-09 MED ORDER — PROPOFOL 10 MG/ML IV BOLUS
INTRAVENOUS | Status: DC | PRN
  Administered 2015-10-09: 200 mg via INTRAVENOUS

## 2015-10-09 MED ORDER — ONDANSETRON HCL 4 MG/2ML IJ SOLN
INTRAMUSCULAR | Status: DC | PRN
  Administered 2015-10-09: 4 mg via INTRAVENOUS

## 2015-10-09 MED ORDER — PROPOFOL 10 MG/ML IV BOLUS
INTRAVENOUS | Status: AC
  Filled 2015-10-09: qty 20

## 2015-10-09 MED ORDER — ONDANSETRON HCL 4 MG/2ML IJ SOLN
4.0000 mg | Freq: Once | INTRAMUSCULAR | Status: AC | PRN
  Administered 2015-10-09: 4 mg via INTRAVENOUS

## 2015-10-09 MED ORDER — ARTIFICIAL TEARS OP OINT
TOPICAL_OINTMENT | OPHTHALMIC | Status: DC | PRN
  Administered 2015-10-09: 1 via OPHTHALMIC

## 2015-10-09 MED FILL — Heparin Sodium (Porcine) 2 Unit/ML in Sodium Chloride 0.9%: INTRAMUSCULAR | Qty: 500 | Status: AC

## 2015-10-09 SURGICAL SUPPLY — 51 items
AGENT HMST SPONGE THK3/8 (HEMOSTASIS) ×1
BANDAGE ELASTIC 4 VELCRO ST LF (GAUZE/BANDAGES/DRESSINGS) ×2 IMPLANT
BANDAGE ELASTIC 6 VELCRO ST LF (GAUZE/BANDAGES/DRESSINGS) ×2 IMPLANT
BANDAGE ESMARK 6X9 LF (GAUZE/BANDAGES/DRESSINGS) ×1 IMPLANT
BNDG CMPR 9X6 STRL LF SNTH (GAUZE/BANDAGES/DRESSINGS) ×1
BNDG ESMARK 6X9 LF (GAUZE/BANDAGES/DRESSINGS) ×2
BNDG GAUZE ELAST 4 BULKY (GAUZE/BANDAGES/DRESSINGS) ×2 IMPLANT
CANISTER SUCTION 2500CC (MISCELLANEOUS) ×2 IMPLANT
CANNULA VESSEL 3MM 2 BLNT TIP (CANNULA) IMPLANT
CLIP TI MEDIUM 6 (CLIP) ×2 IMPLANT
CLIP TI WIDE RED SMALL 6 (CLIP) ×2 IMPLANT
COVER PROBE W GEL 5X96 (DRAPES) ×2 IMPLANT
CUFF TOURNIQUET SINGLE 24IN (TOURNIQUET CUFF) ×2 IMPLANT
DECANTER SPIKE VIAL GLASS SM (MISCELLANEOUS) IMPLANT
DRAIN PENROSE 1/4X12 LTX STRL (WOUND CARE) ×2 IMPLANT
DRAPE CHEST BREAST 15X10 FENES (DRAPES) ×2 IMPLANT
ELECT REM PT RETURN 9FT ADLT (ELECTROSURGICAL) ×2
ELECTRODE REM PT RTRN 9FT ADLT (ELECTROSURGICAL) ×1 IMPLANT
GAUZE SPONGE 4X4 12PLY STRL (GAUZE/BANDAGES/DRESSINGS) ×2 IMPLANT
GAUZE SPONGE 4X4 16PLY XRAY LF (GAUZE/BANDAGES/DRESSINGS) ×4 IMPLANT
GLOVE BIO SURGEON STRL SZ 6.5 (GLOVE) ×6 IMPLANT
GLOVE BIO SURGEON STRL SZ7.5 (GLOVE) ×4 IMPLANT
GLOVE BIOGEL PI IND STRL 6.5 (GLOVE) ×3 IMPLANT
GLOVE BIOGEL PI IND STRL 8 (GLOVE) ×3 IMPLANT
GLOVE BIOGEL PI INDICATOR 6.5 (GLOVE) ×3
GLOVE BIOGEL PI INDICATOR 8 (GLOVE) ×3
GOWN STRL REUS W/ TWL LRG LVL3 (GOWN DISPOSABLE) ×3 IMPLANT
GOWN STRL REUS W/TWL LRG LVL3 (GOWN DISPOSABLE) ×6
HEMOSTAT SPONGE AVITENE ULTRA (HEMOSTASIS) ×2 IMPLANT
KIT BASIN OR (CUSTOM PROCEDURE TRAY) ×2 IMPLANT
KIT ROOM TURNOVER OR (KITS) ×2 IMPLANT
LIQUID BAND (GAUZE/BANDAGES/DRESSINGS) ×2 IMPLANT
NEEDLE 18GX1X1/2 (RX/OR ONLY) (NEEDLE) ×2 IMPLANT
NEEDLE HYPO 25GX1X1/2 BEV (NEEDLE) ×2 IMPLANT
NS IRRIG 1000ML POUR BTL (IV SOLUTION) ×2 IMPLANT
PACK CV ACCESS (CUSTOM PROCEDURE TRAY) ×2 IMPLANT
PAD ARMBOARD 7.5X6 YLW CONV (MISCELLANEOUS) ×4 IMPLANT
PAD CAST 4YDX4 CTTN HI CHSV (CAST SUPPLIES) ×1 IMPLANT
PADDING CAST COTTON 4X4 STRL (CAST SUPPLIES) ×2
SPONGE SURGIFOAM ABS GEL 100 (HEMOSTASIS) IMPLANT
SUT ETHILON 3 0 PS 1 (SUTURE) ×10 IMPLANT
SUT PROLENE 5 0 C 1 24 (SUTURE) ×2 IMPLANT
SUT PROLENE 6 0 BV (SUTURE) ×4 IMPLANT
SUT VIC AB 3-0 SH 27 (SUTURE) ×4
SUT VIC AB 3-0 SH 27X BRD (SUTURE) ×2 IMPLANT
SUT VICRYL 4-0 PS2 18IN ABS (SUTURE) ×6 IMPLANT
SYRINGE 10CC LL (SYRINGE) ×2 IMPLANT
SYRINGE 20CC LL (MISCELLANEOUS) ×2 IMPLANT
TUBE ANAEROBIC SPECIMEN COL (MISCELLANEOUS) ×2 IMPLANT
UNDERPAD 30X30 INCONTINENT (UNDERPADS AND DIAPERS) ×2 IMPLANT
WATER STERILE IRR 1000ML POUR (IV SOLUTION) ×2 IMPLANT

## 2015-10-09 NOTE — Progress Notes (Signed)
Patient ID: Zachary [name], male   DOB: [birth year], 69 y.o.   MRN: [id number] S:Somnolent post-op but no complaints O:BP 153/81 mmHg  Pulse 94  Temp(Src) 98.5 F (36.9 C) (Oral)  Resp 17  Ht 5\' 8"  (1.727 m)  Wt 86.864 kg (191 lb 8 oz)  BMI 29.12 kg/m2  SpO2 99%  Intake/Output Summary (Last 24 hours) at 10/09/15 0839 Last data filed at 10/09/15 0650  Gross per 24 hour  Intake    480 ml  Output    800 ml  Net   -320 ml   Intake/Output: I/O last 3 completed shifts: In: 480 [P.O.:480] Out: 800 [Urine:800]  Intake/Output this shift:    Weight change:  Gen:WD WN AAM in NAd CVS:no rub Resp:cta [id number] Ext:L arm bandaged, some residual edema, no lower ext edema   Recent Labs Lab 10/08/15 0949 10/08/15 1241 10/09/15 0547  NA 140 140 142  K 4.1 4.1 4.2  CL 102 106 107  CO2  --  26 27  GLUCOSE 97 99 98  BUN 35* 34* 35*  CREATININE 2.80* 2.85* 3.18*  ALBUMIN  --  2.8* 2.4*  CALCIUM  --  8.9 8.7*  PHOS  --   --  3.2  AST  --  16  --   ALT  --  12*  --    Liver Function Tests:  Recent Labs Lab 10/08/15 1241 10/09/15 0547  AST 16  --   ALT 12*  --   ALKPHOS 66  --   BILITOT 0.7  --   PROT 6.3*  --   ALBUMIN 2.8* 2.4*   No results for input(s): LIPASE, AMYLASE in the last 168 hours. No results for input(s): AMMONIA in the last 168 hours. CBC:  Recent Labs Lab 10/08/15 0949 10/08/15 1241 10/09/15 0547  WBC  --  6.5 6.8  HGB 8.8* 8.3* 7.9*  HCT 26.0* 25.4* 24.5*  MCV  --  97.3 97.6  PLT  --  204 191   Cardiac Enzymes: No results for input(s): CKTOTAL, CKMB, CKMBINDEX, TROPONINI in the last 168 hours. CBG: No results for input(s): GLUCAP in the last 168 hours.  Iron Studies:  Recent Labs  10/09/15 0547  IRON 29*  TIBC 157*  FERRITIN 254   Studies/Results: No results found. Doug Sou Hold] carvedilol  6.25 mg Oral BID WC  . [MAR Hold] enoxaparin (LOVENOX) injection  30 mg Subcutaneous Q24H  . [MAR Hold] ferrous sulfate  325 mg Oral Q breakfast   . [MAR Hold] isosorbide-hydrALAZINE  1 tablet Oral TID  . [MAR Hold] levothyroxine  75 mcg Oral QAC breakfast  . [MAR Hold] linagliptin  5 mg Oral Daily  . [MAR Hold] niacin  1,000 mg Oral QHS  . [MAR Hold] pantoprazole  40 mg Oral Daily  . [MAR Hold] piperacillin-tazobactam (ZOSYN)  IV  3.375 g Intravenous Q8H  . potassium chloride  20-40 mEq Oral Once  . [MAR Hold] simvastatin  20 mg Oral QHS  . [MAR Hold] tamsulosin  0.4 mg Oral Daily  . [MAR Hold] torsemide  20 mg Oral Daily  . [MAR Hold] Vitamin D (Ergocalciferol)  50,000 Units Oral Q7 days    BMET    Component Value Date/Time   NA 142 10/09/2015 0547   K 4.2 10/09/2015 0547   CL 107 10/09/2015 0547   CO2 27 10/09/2015 0547   GLUCOSE 98 10/09/2015 0547   BUN 35* 10/09/2015 0547   CREATININE 3.18* 10/09/2015 0547   CALCIUM  8.7* 10/09/2015 0547   CALCIUM 8.3* 03/03/2015 0622   GFRNONAA 19* 10/09/2015 0547   GFRAA 22* 10/09/2015 0547   CBC    Component Value Date/Time   WBC 6.8 10/09/2015 0547   RBC 2.51* 10/09/2015 0547   RBC 2.24* 03/02/2015 1417   HGB 7.9* 10/09/2015 0547   HCT 24.5* 10/09/2015 0547   PLT 191 10/09/2015 0547   MCV 97.6 10/09/2015 0547   MCH 31.5 10/09/2015 0547   MCHC 32.2 10/09/2015 0547   RDW 15.8* 10/09/2015 0547   LYMPHSABS 0.8 09/13/2015 1442   MONOABS 1.4* 09/13/2015 1442   EOSABS 0.0 09/13/2015 1442   BASOSABS 0.0 09/13/2015 1442     Assessment/Plan: 1. Complication of Vascular access- his left arm is markedly edematous, warm, and tender. No evidence of central stenosis by shuntogram. 1. s/p exploration in OR today by Dr. Scot Dock and removal of AVG due to seroma and lymphatic drainage 2. Since his Scr is better than his baseline, would hold off on tunneled HD catheter for now as there is no indication for dialysis at this time.  Dr. Lowanda Foster can continue to monitor as an outpatient and refer back to [facility] when he is closer to HD. 2. CKD stage 4- Scr is below baseline. No indication  for dialysis at this time and will continue to follow.  Hopefully the small amount of contrast given for central venogram will not significantly impact his renal function. 3. Anemia of chronic disease- will check iron stores and initiate ESA if not already receiving as an outpt in [place]. 4. HTN- continue with meds. 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 Hypothyroidism- cont with replacement therapy.  Zachary [name]

## 2015-10-09 NOTE — Transfer of Care (Signed)
Immediate Anesthesia Transfer of Care Note  Patient: Zachary [name]  Procedure(s) Performed: Procedure(s): REMOVAL OF ARTERIOVENOUS GORETEX GRAFT ([place]) Evacuation of Lymphocele, Vein Patch angioplasty of brachial artery. (Left)  Patient Location: PACU  Anesthesia Type:General  Level of Consciousness: awake, alert , oriented and pateint uncooperative  Airway & Oxygen Therapy: Patient Spontanous Breathing and Patient connected to face mask oxygen  Post-op Assessment: Report given to RN, Post -op Vital signs reviewed and stable and Patient moving all extremities  Post vital signs: Reviewed and stable  Last Vitals:  BP 126/74 (88) HR 87 RR 10 SpO2 100% Resting comfortably, maintains good airway, denies pain.  Complications: No apparent anesthesia complications

## 2015-10-09 NOTE — Anesthesia Procedure Notes (Signed)
Procedure Name: Intubation Date/Time: 10/09/2015 9:32 AM Performed by: Willeen Cass P Pre-anesthesia Checklist: Patient identified, Timeout performed, Emergency Drugs available, Suction available and Patient being monitored Patient Re-evaluated:Patient Re-evaluated prior to inductionOxygen Delivery Method: Circle system utilized Preoxygenation: Pre-oxygenation with 100% oxygen Intubation Type: IV induction Ventilation: Mask ventilation without difficulty Laryngoscope Size: Mac and 4 Grade View: Grade II Tube type: Oral Tube size: 7.5 mm Number of attempts: 1 Airway Equipment and Method: Stylet Placement Confirmation: ETT inserted through vocal cords under direct vision,  breath sounds checked- equal and bilateral and positive ETCO2 Secured at: 21 cm Tube secured with: Tape Dental Injury: Teeth and Oropharynx as per pre-operative assessment

## 2015-10-09 NOTE — Anesthesia Postprocedure Evaluation (Signed)
Anesthesia Post Note  Patient: Zachary [name]  Procedure(s) Performed: Procedure(s) (LRB): REMOVAL OF ARTERIOVENOUS GORETEX GRAFT ([place]) Evacuation of Lymphocele, Vein Patch angioplasty of brachial artery. (Left)  Patient location during evaluation: PACU Anesthesia Type: General Level of consciousness: awake and alert Pain management: pain level controlled Vital Signs Assessment: post-procedure vital signs reviewed and stable Respiratory status: spontaneous breathing, nonlabored ventilation, respiratory function stable and patient connected to nasal cannula oxygen Cardiovascular status: blood pressure returned to baseline and stable Postop Assessment: no signs of nausea or vomiting Anesthetic complications: no Comments: Baseline hemodynamics and mental status    Last Vitals:  Filed Vitals:   10/09/15 1300 10/09/15 1309  BP: 90/55 88/59  Pulse: 75 85  Temp:  36.1 C  Resp: 12 14    Last Pain:  Filed Vitals:   10/09/15 1311  PainSc: Asleep                 Zenaida Deed

## 2015-10-09 NOTE — Op Note (Signed)
    NAME: Zachary [name]   MRN: [id number] DOB: 02-09-1946    DATE OF OPERATION: 10/09/2015  PREOP DIAGNOSIS: Infected left upper arm AV graft  POSTOP DIAGNOSIS: Lymphocele left upper arm AV graft  PROCEDURE:  1. 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 Removal of left upper arm AV graft 3. Vein patch into plasty of brachial artery  SURGEON: Judeth Cornfield. Scot Dock, MD, FACS  ASSIST: Silva Bandy, [facility]  ANESTHESIA: Gen.   EBL: 400 mL  INDICATIONS: Zachary [name] is a 69 y.o. male who presented with a massively swollen upper arm and appeared to have a graft infection as there was fluctuance at the antecubital incision. He underwent a central venogram which shows no central venous stenosis.  FINDINGS: He had a massive lymphocele on the arm which was evacuated. The graft was removed because of the extensive lymphatic leakage from the graft.  TECHNIQUE: The patient was taken to the operating room and received a general anesthetic. The left upper extremity was prepped and draped in usual sterile fashion. Incision at the antecubital level was opened and there was a large lymphocele present. There was no purulence. I did send aerobic and anaerobic cultures. The large amount of lymphocele was evacuated and the graft ligated. Next a longitudinal incision was made beneath the axilla and here the venous limb of the graft was dissected free. The venous limb was ligated. One counterincision was made in the mid upper arm where the graft was dissected free allowing me to remove the entire graft. The venous anastomosis was dissected free and the brachial vein was ligated. A segment of the brachial vein was excised to be used as a vein patch. Next attention was turned to the arterial anastomosis. There was dense scar tissue making dissection difficult. I therefore elected to use a tourniquet. The patient was heparinized. Tourniquet was placed on the upper arm. The arm was exsanguinated with an Esmarch  bandage and the tourniquet inflated to 250 mmHg. Under tourniquet control the entire graft was removed from the artery. The vein patch was sewn using continuous 6-0 Prolene suture. At the completion was a good radial and ulnar signal with Doppler. There was extensive oozing from multiple areas and we tried topical agents with little benefit. Therefore elected to give DDAVP. This helped significantly. Hemostasis was obtained and the axillary incision. A quarter-inch Penrose drain was placed throughout the tract and the axillary incision was closed with deeper 3-0 Vicryl. The skin was closed with 4-0 Vicryl. The counterincision was closed with interrupted 3-0 nylon sutures. The antecubital incision was closed with interrupted 30 nylons. Sterile dressing was applied. Patient tolerated the procedure well and was transferred to the recovery room in stable condition. All needle and sponge counts were correct.  Given that there did not appear to be infection I was going to  place a catheter.  however, nephrology called and stated that his kidney function was improving and to hold off on the catheter.  Patient tolerated procedure well and transferred to the recovery room in stable condition. All needle and sponge counts were correct.  [name], MD, FACS [facility]  DATE OF DICTATION:   10/09/2015

## 2015-10-09 NOTE — Anesthesia Preprocedure Evaluation (Addendum)
Anesthesia Evaluation  Patient identified by MRN, date of birth, ID band Patient awake    Reviewed: Allergy & Precautions, NPO status , Patient's Chart, lab work & pertinent test results  Airway Mallampati: III  TM Distance: >3 FB     Dental  (+) Poor Dentition, Missing, Loose   Pulmonary Current Smoker,    Pulmonary exam normal breath sounds clear to auscultation       Cardiovascular hypertension, Pt. on medications Normal cardiovascular exam Rhythm:Regular Rate:Normal  02/2015 echo with normal EF, no significant valve abnormalities   Neuro/Psych PSYCHIATRIC DISORDERS Anxiety    GI/Hepatic   Endo/Other  diabetes, Poorly ControlledHypothyroidism   Renal/GU ESRF and Renal InsufficiencyRenal disease     Musculoskeletal   Abdominal   Peds  Hematology  (+) anemia ,   Anesthesia Other Findings   Reproductive/Obstetrics                         Anesthesia Physical Anesthesia Plan  ASA: IV  Anesthesia Plan: General   Post-op Pain Management:    Induction: Intravenous  Airway Management Planned: LMA  Additional Equipment:   Intra-op Plan:   Post-operative Plan: Extubation in OR  Informed Consent: I have reviewed the patients History and Physical, chart, labs and discussed the procedure including the risks, benefits and alternatives for the proposed anesthesia with the patient or authorized representative who has indicated his/her understanding and acceptance.   Dental advisory given  Plan Discussed with: Anesthesiologist, CRNA and Surgeon  Anesthesia Plan Comments: (ESRD not yet on dialysis)       Anesthesia Quick Evaluation                                  Anesthesia Evaluation  Patient identified by MRN, date of birth, ID band Patient awake    Reviewed: Allergy & Precautions, NPO status , Patient's Chart, lab work & pertinent test results, reviewed documented beta blocker  date and time   History of Anesthesia Complications Negative for: history of anesthetic complications  Airway Mallampati: IV  TM Distance: >3 FB Neck ROM: Full  Mouth opening: Limited Mouth Opening Comment: Poor exam by patient effort Dental  (+) Teeth Intact,    Pulmonary neg pulmonary ROS,    breath sounds clear to auscultation       Cardiovascular hypertension, Pt. on medications and Pt. on home beta blockers (-) Past MI and (-) CHF (-) dysrhythmias  Rhythm:Regular     Neuro/Psych PSYCHIATRIC DISORDERS Anxiety negative neurological ROS     GI/Hepatic Neg liver ROS, Decreased PO x 2 weeks according to patient and actively nauseated   Endo/Other  diabetesHypothyroidism   Renal/GU CRF and ESRFRenal disease     Musculoskeletal   Abdominal   Peds  Hematology  (+) anemia ,   Anesthesia Other Findings This CRNA finds AW to be better than a class IV; maybe a II-III.  There are missing teeth and some of teeth are loose and covered in plaque.   KBJames, CRNA       Reproductive/Obstetrics                           Anesthesia Physical Anesthesia Plan  ASA: III  Anesthesia Plan: General   Post-op Pain Management:    Induction: Intravenous, Cricoid pressure planned and Rapid sequence  Airway Management Planned: Oral ETT  Additional  Equipment: None  Intra-op Plan:   Post-operative Plan: Extubation in OR  Informed Consent: I have reviewed the patients History and Physical, chart, labs and discussed the procedure including the risks, benefits and alternatives for the proposed anesthesia with the patient or authorized representative who has indicated his/her understanding and acceptance.   Dental advisory given  Plan Discussed with: CRNA and Surgeon  Anesthesia Plan Comments:         Anesthesia Quick Evaluation                                   Anesthesia Evaluation  Patient identified by MRN, date of birth, ID  band Patient awake    Reviewed: Allergy & Precautions, NPO status , Patient's Chart, lab work & pertinent test results, reviewed documented beta blocker date and time   History of Anesthesia Complications Negative for: history of anesthetic complications  Airway Mallampati: IV  TM Distance: >3 FB Neck ROM: Full  Mouth opening: Limited Mouth Opening Comment: Poor exam by patient effort Dental  (+) Teeth Intact,    Pulmonary neg pulmonary ROS,    breath sounds clear to auscultation       Cardiovascular hypertension, Pt. on medications and Pt. on home beta blockers (-) Past MI and (-) CHF (-) dysrhythmias  Rhythm:Regular     Neuro/Psych PSYCHIATRIC DISORDERS Anxiety negative neurological ROS     GI/Hepatic Neg liver ROS, Decreased PO x 2 weeks according to patient and actively nauseated   Endo/Other  diabetesHypothyroidism   Renal/GU CRF and ESRFRenal disease     Musculoskeletal   Abdominal   Peds  Hematology  (+) anemia ,   Anesthesia Other Findings This CRNA finds AW to be better than a class IV; maybe a II-III.  There are missing teeth and some of teeth are loose and covered in plaque.   KBJames, CRNA       Reproductive/Obstetrics                           Anesthesia Physical Anesthesia Plan  ASA: III  Anesthesia Plan: General   Post-op Pain Management:    Induction: Intravenous, Cricoid pressure planned and Rapid sequence  Airway Management Planned: Oral ETT  Additional Equipment: None  Intra-op Plan:   Post-operative Plan: Extubation in OR  Informed Consent: I have reviewed the patients History and Physical, chart, labs and discussed the procedure including the risks, benefits and alternatives for the proposed anesthesia with the patient or authorized representative who has indicated his/her understanding and acceptance.   Dental advisory given  Plan Discussed with: CRNA and Surgeon  Anesthesia Plan  Comments:         Anesthesia Quick Evaluation

## 2015-10-09 NOTE — Interval H&P Note (Signed)
History and Physical Interval Note:  10/09/2015 8:56 AM  Zachary [name]  has presented today for surgery, with the diagnosis of Infected left upper arm arteriovenous graft T82.7XXA  The various methods of treatment have been discussed with the patient and family. After consideration of risks, benefits and other options for treatment, the patient has consented to  Procedure(s): REMOVAL OF ARTERIOVENOUS GORETEX GRAFT ([place]) (Left) as a surgical intervention .  [name] history has been reviewed, patient examined, no change in status, stable for surgery.  I have reviewed the patient's chart and labs.  Questions were answered to the patient's satisfaction.     [name]

## 2015-10-09 NOTE — Progress Notes (Signed)
ANTIBIOTIC CONSULT NOTE  Pharmacy Consult for zosyn, vancomycin Indication: wound infection  No Known Allergies  Patient Measurements: Height: 5\' 8"  (172.7 cm) Weight: 191 lb 8 oz (86.864 kg) IBW/kg (Calculated) : 68.4  Vital Signs: Temp: 97 F (36.1 C) (12/13 1309) Temp Source: Oral (12/13 0817) BP: 88/59 mmHg (12/13 1309) Pulse Rate: 85 (12/13 1309) Intake/Output from previous day: 12/12 0701 - 12/13 0700 In: 480 [P.O.:480] Out: 800 [Urine:800] Intake/Output from this shift: Total I/O In: 750 [I.V.:750] Out: 400 [Blood:400]  Labs:  Recent Labs  10/08/15 0949 10/08/15 1241 10/09/15 0547  WBC  --  6.5 6.8  HGB 8.8* 8.3* 7.9*  PLT  --  204 191  CREATININE 2.80* 2.85* 3.18*   Estimated Creatinine Clearance: 24.2 mL/min (by C-G formula based on Cr of 3.18). No results for input(s): VANCOTROUGH, VANCOPEAK, VANCORANDOM, GENTTROUGH, GENTPEAK, GENTRANDOM, TOBRATROUGH, TOBRAPEAK, TOBRARND, AMIKACINPEAK, AMIKACINTROU, AMIKACIN in the last 72 hours.   Microbiology: Recent Results (from the past 720 hour(s))  Urine culture     Status: None   Collection Time: 09/13/15  2:31 PM  Result Value Ref Range Status   Specimen Description URINE, CLEAN CATCH  Final   Special Requests NONE  Final   Culture   Final    NO GROWTH 1 DAY Performed at [facility]    Report Status 09/15/2015 FINAL  Final    Medical History: Past Medical History  Diagnosis Date  . Hypertension   . Diabetes mellitus   . Smoking   . Abnormal CT scan, kidney 10/06/2011  . UTI (lower urinary tract infection) 10/06/2011  . Bladder wall thickening 10/06/2011  . 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 Poor historian poor historian  . BPH (benign prostatic hypertrophy)   . Anxiety     mental retardation  . Hypothyroidism   . MR (mental retardation)   . Acute pyelonephritis 10/07/2011  . Hyperkalemia   . Renal insufficiency     chronic history  . Renal failure (ARF), acute on chronic (HCC)   .  Anemia     normocytic  . Edema      history of lower extremity edema  . Hyperlipidemia   . Impaired speech     Medications:  Prescriptions prior to admission  Medication Sig Dispense Refill Last Dose  . carvedilol (COREG) 6.25 MG tablet Take 1 tablet (6.25 mg total) by mouth 2 (two) times daily with a meal. 60 tablet 3 10/07/2015 at 2200  . ferrous sulfate 325 (65 FE) MG tablet Take 325 mg by mouth daily with breakfast.   10/07/2015 at 0800  . isosorbide-hydrALAZINE (BIDIL) 20-37.5 MG per tablet Take 1 tablet by mouth 3 (three) times daily. (Patient taking differently: Take 1 tablet by mouth 3 (three) times daily. 8am,2pm,8pm) 90 tablet 3 10/07/2015 at 2200  . levothyroxine (SYNTHROID, LEVOTHROID) 75 MCG tablet Take 75 mcg by mouth daily before breakfast.    10/07/2015 at 0800  . linagliptin (TRADJENTA) 5 MG TABS tablet Take 5 mg by mouth daily.   10/07/2015 at 0800  . niacin (NIASPAN) 1000 MG CR tablet Take 1,000 mg by mouth at bedtime.   10/07/2015 at 2200  . omeprazole (PRILOSEC) 20 MG capsule Take 40 mg by mouth daily.   10/07/2015 at 0800  . simvastatin (ZOCOR) 20 MG tablet Take 20 mg by mouth at bedtime.    10/07/2015 at Unknown time  . sodium polystyrene (KAYEXALATE) 15 GM/60ML suspension Take 30 g by mouth. Daily on Monday and Friday   10/05/2015  .  tamsulosin (FLOMAX) 0.4 MG CAPS capsule Take 0.4 mg by mouth daily.   10/07/2015 at 0800  . torsemide (DEMADEX) 20 MG tablet Take 20 mg by mouth daily. Daily at 8am   10/07/2015 at 0800  . Vitamin D, Ergocalciferol, (DRISDOL) 50000 UNITS CAPS capsule Take 1 capsule (50,000 Units total) by mouth every 7 (seven) days. (Patient taking differently: Take 50,000 Units by mouth every 7 (seven) days. On Monday) 30 capsule 3 10/01/2015  . HYDROcodone-acetaminophen (NORCO/VICODIN) 5-325 MG per tablet Take 1 tablet by mouth every 6 (six) hours as needed for moderate pain. (Patient not taking: Reported on 09/27/2015) 30 tablet 0 Not Taking   Scheduled:   . carvedilol  6.25 mg Oral BID WC  . enoxaparin (LOVENOX) injection  30 mg Subcutaneous Q24H  . ferrous sulfate  325 mg Oral Q breakfast  . isosorbide-hydrALAZINE  1 tablet Oral TID  . levothyroxine  75 mcg Oral QAC breakfast  . linagliptin  5 mg Oral Daily  . niacin  1,000 mg Oral QHS  . ondansetron      . pantoprazole  40 mg Oral Daily  . piperacillin-tazobactam (ZOSYN)  IV  3.375 g Intravenous Q8H  . potassium chloride  20-40 mEq Oral Once  . simvastatin  20 mg Oral QHS  . tamsulosin  0.4 mg Oral Daily  . torsemide  20 mg Oral Daily  . Vitamin D (Ergocalciferol)  50,000 Units Oral Q7 days    Assessment: 69 yo male with CKD and  here with a swollen left upper arm now s/p fistulogram (HD graft placed in Sept). Pharmacy has been consulted to dose vancomycin and zosyn for possible graft infection, D#2. WBC wnl, afebrile, SCr= 2.8>>3.18 (2.54- 5.24 in the last 3 weeks), CrCl~24. Infected graft removed 12/13, no indication for HD at this time per Renal.  12/12 zosyn>> 12/12 vanc>>  12/12 wound cx>>  Goal of Therapy:  Vancomycin trough level 10-15 mcg/ml   Plan -Vancomycin 1750mg  IV x1 - further dosing by renal function trend -Vanc 1g IV q48h - to start tomorrow, VR prior to dose -Zosyn 3.375gm IV q8h -Will follow renal function, clinical progress, c/s -VR 12/14 @1200   Elicia Lamp, PharmD, BCPS Clinical Pharmacist Pager [phone] 10/09/2015 2:10 PM

## 2015-10-10 ENCOUNTER — Encounter (HOSPITAL_COMMUNITY): Payer: Self-pay | Admitting: Vascular Surgery

## 2015-10-10 LAB — CBC
HCT: 17.5 % — ABNORMAL LOW (ref 39.0–52.0)
Hemoglobin: 5.6 g/dL — CL (ref 13.0–17.0)
MCH: 32 pg (ref 26.0–34.0)
MCHC: 32 g/dL (ref 30.0–36.0)
MCV: 100 fL (ref 78.0–100.0)
Platelets: 166 10*3/uL (ref 150–400)
RBC: 1.75 MIL/uL — ABNORMAL LOW (ref 4.22–5.81)
RDW: 16.5 % — ABNORMAL HIGH (ref 11.5–15.5)
WBC: 9 10*3/uL (ref 4.0–10.5)

## 2015-10-10 LAB — RENAL FUNCTION PANEL
Albumin: 2.3 g/dL — ABNORMAL LOW (ref 3.5–5.0)
Anion gap: 9 (ref 5–15)
BUN: 42 mg/dL — ABNORMAL HIGH (ref 6–20)
CO2: 23 mmol/L (ref 22–32)
Calcium: 8.7 mg/dL — ABNORMAL LOW (ref 8.9–10.3)
Chloride: 109 mmol/L (ref 101–111)
Creatinine, Ser: 4.3 mg/dL — ABNORMAL HIGH (ref 0.61–1.24)
GFR calc Af Amer: 15 mL/min — ABNORMAL LOW (ref >60)
GFR calc non Af Amer: 13 mL/min — ABNORMAL LOW (ref >60)
Glucose, Bld: 124 mg/dL — ABNORMAL HIGH (ref 65–99)
Phosphorus: 4.2 mg/dL (ref 2.5–4.6)
Potassium: 4.3 mmol/L (ref 3.5–5.1)
Sodium: 141 mmol/L (ref 135–145)

## 2015-10-10 LAB — PREPARE RBC (CROSSMATCH)

## 2015-10-10 LAB — VANCOMYCIN, RANDOM: Vancomycin Rm: 12 ug/mL

## 2015-10-10 LAB — PARATHYROID HORMONE, INTACT (NO CA): PTH: 61 pg/mL (ref 15–65)

## 2015-10-10 MED ORDER — PIPERACILLIN-TAZOBACTAM IN DEX 2-0.25 GM/50ML IV SOLN
2.2500 g | Freq: Three times a day (TID) | INTRAVENOUS | Status: DC
  Administered 2015-10-10 – 2015-10-11 (×3): 2.25 g via INTRAVENOUS
  Filled 2015-10-10 (×5): qty 50

## 2015-10-10 MED ORDER — ACETAMINOPHEN 325 MG PO TABS
650.0000 mg | ORAL_TABLET | Freq: Four times a day (QID) | ORAL | Status: DC | PRN
  Administered 2015-10-10: 650 mg via ORAL
  Filled 2015-10-10: qty 2

## 2015-10-10 MED ORDER — SODIUM CHLORIDE 0.9 % IV SOLN: Freq: Once | INTRAVENOUS | Status: DC

## 2015-10-10 NOTE — Progress Notes (Signed)
Patient ID: Zachary [name], male   DOB: 08/26/1946, 69 y.o.   MRN: [id number] S:no complaints O:BP 112/48 mmHg  Pulse 114  Temp(Src) 99.7 F (37.6 C) (Oral)  Resp 17  Ht 5\' 8"  (1.727 m)  Wt 88.043 kg (194 lb 1.6 oz)  BMI 29.52 kg/m2  SpO2 96%  Intake/Output Summary (Last 24 hours) at 10/10/15 1113 Last data filed at 10/10/15 1000  Gross per 24 hour  Intake   1070 ml  Output     51 ml  Net   1019 ml   Intake/Output: I/O last 3 completed shifts: In: [id number] [P.O.:720; I.V.:750; IV Piggyback:100] Out: 1200 [Urine:800; Blood:400]  Intake/Output this shift:  Total I/O In: 0  Out: 51 [Urine:50; Emesis/NG output:1] Weight change: -1.769 kg (-3 lb 14.4 oz) Gen:WD AAM in NAD [id number], no rub Resp:cta [id number] Ext:+edema of left arm, bandages in place   Recent Labs Lab 10/08/15 0949 10/08/15 1241 10/09/15 0547 10/10/15 0955  NA 140 140 142 141  K 4.1 4.1 4.2 4.3  CL 102 106 107 109  CO2  --  26 27 23   GLUCOSE 97 99 98 124*  BUN 35* 34* 35* 42*  CREATININE 2.80* 2.85* 3.18* 4.30*  ALBUMIN  --  2.8* 2.4* 2.3*  CALCIUM  --  8.9 8.7* 8.7*  PHOS  --   --  3.2 4.2  AST  --  16  --   --   ALT  --  12*  --   --    Liver Function Tests:  Recent Labs Lab 10/08/15 1241 10/09/15 0547 10/10/15 0955  AST 16  --   --   ALT 12*  --   --   ALKPHOS 66  --   --   BILITOT 0.7  --   --   PROT 6.3*  --   --   ALBUMIN 2.8* 2.4* 2.3*   No results for input(s): LIPASE, AMYLASE in the last 168 hours. No results for input(s): AMMONIA in the last 168 hours. CBC:  Recent Labs Lab 10/08/15 1241 10/09/15 0547 10/10/15 0955  WBC 6.5 6.8 9.0  HGB 8.3* 7.9* 5.6*  HCT 25.4* 24.5* 17.5*  MCV 97.3 97.6 100.0  PLT 204 191 166   Cardiac Enzymes: No results for input(s): CKTOTAL, CKMB, CKMBINDEX, TROPONINI in the last 168 hours. CBG:  Recent Labs Lab 10/09/15 1213  GLUCAP 125*    Iron Studies:  Recent Labs  10/09/15 0547  IRON 29*  TIBC 157*  FERRITIN 254    Studies/Results: No results found. . sodium chloride   Intravenous Once  . carvedilol  6.25 mg Oral BID WC  . enoxaparin (LOVENOX) injection  30 mg Subcutaneous Q24H  . ferrous sulfate  325 mg Oral Q breakfast  . isosorbide-hydrALAZINE  1 tablet Oral TID  . levothyroxine  75 mcg Oral QAC breakfast  . linagliptin  5 mg Oral Daily  . niacin  1,000 mg Oral QHS  . pantoprazole  40 mg Oral Daily  . piperacillin-tazobactam (ZOSYN)  IV  3.375 g Intravenous Q8H  . potassium chloride  20-40 mEq Oral Once  . simvastatin  20 mg Oral QHS  . tamsulosin  0.4 mg Oral Daily  . torsemide  20 mg Oral Daily  . vancomycin  1,000 mg Intravenous Q48H  . Vitamin D (Ergocalciferol)  50,000 Units Oral Q7 days    BMET    Component Value Date/Time   NA 141 10/10/2015 0955   K 4.3 10/10/2015 0955  CL 109 10/10/2015 0955   CO2 23 10/10/2015 0955   GLUCOSE 124* 10/10/2015 0955   BUN 42* 10/10/2015 0955   CREATININE 4.30* 10/10/2015 0955   CALCIUM 8.7* 10/10/2015 0955   CALCIUM 8.3* 03/03/2015 0622   GFRNONAA 13* 10/10/2015 0955   GFRAA 15* 10/10/2015 0955   CBC    Component Value Date/Time   WBC 9.0 10/10/2015 0955   RBC 1.75* 10/10/2015 0955   RBC 2.24* 03/02/2015 1417   HGB 5.6* 10/10/2015 0955   HCT 17.5* 10/10/2015 0955   PLT 166 10/10/2015 0955   MCV 100.0 10/10/2015 0955   MCH 32.0 10/10/2015 0955   MCHC 32.0 10/10/2015 0955   RDW 16.5* 10/10/2015 0955   LYMPHSABS 0.8 09/13/2015 1442   MONOABS 1.4* 09/13/2015 1442   EOSABS 0.0 09/13/2015 1442   BASOSABS 0.0 09/13/2015 1442     Assessment/Plan: 1. Complication of Vascular access- his left arm is markedly edematous, warm, and tender. No evidence of central stenosis by shuntogram. 1. s/p exploration in OR today by Dr. Scot Dock and removal of AVG due to seroma and lymphatic drainage 2. Since his Scr is better than his baseline, would hold off on tunneled HD catheter for now as there is no indication for dialysis at this time.  Dr. Lowanda Foster can continue to monitor as an outpatient and refer back to [facility] when he is closer to HD. 2. AKI/CKD stage 4- Scr is below baseline. No indication for dialysis at this time and will continue to follow. Hopefully the small amount of contrast given for central venogram will not significantly impact his renal function. 1. AKI/CKD likely related to ABLA and profound anemia and relative hypotension.   2. Will transfuse and follow renal function 3. ABLA/Anemia of chronic disease- Hgb dropped to 5.6 post-op.  Patient asymptomatic 1. Plan for blood transfusion x 2 units PRBC's and follow H/H. 4. HTN- continue with meds. 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 Hypothyroidism- cont with replacement therapy. [place]

## 2015-10-10 NOTE — Progress Notes (Signed)
ANTIBIOTIC CONSULT NOTE  Pharmacy Consult for zosyn, vancomycin Indication: wound infection  No Known Allergies  Patient Measurements: Height: 5\' 8"  (172.7 cm) Weight: 194 lb 1.6 oz (88.043 kg) IBW/kg (Calculated) : 68.4  Vital Signs: Temp: 99.7 F (37.6 C) (12/14 0805) Temp Source: Oral (12/14 0805) BP: 112/48 mmHg (12/14 0805) Pulse Rate: 114 (12/14 0805) Intake/Output from previous day: 12/13 0701 - 12/14 0700 In: [id number] [P.O.:720; I.V.:750; IV Piggyback:100] Out: 400 [Blood:400] Intake/Output from this shift: Total I/O In: 0  Out: 51 [Urine:50; Emesis/NG output:1]  Labs:  Recent Labs  10/08/15 1241 10/09/15 0547 10/10/15 0955  WBC 6.5 6.8 9.0  HGB 8.3* 7.9* 5.6*  PLT 204 191 166  CREATININE 2.85* 3.18* 4.30*   Estimated Creatinine Clearance: 18 mL/min (by C-G formula based on Cr of 4.3).  Recent Labs  10/10/15 1144  VANCORANDOM 12     Microbiology: Recent Results (from the past 720 hour(s))  Urine culture     Status: None   Collection Time: 09/13/15  2:31 PM  Result Value Ref Range Status   Specimen Description URINE, CLEAN CATCH  Final   Special Requests NONE  Final   Culture   Final    NO GROWTH 1 DAY Performed at [facility]    Report Status 09/15/2015 FINAL  Final  Wound culture     Status: None (Preliminary result)   Collection Time: 10/09/15 10:05 AM  Result Value Ref Range Status   Specimen Description WOUND LEFT ARM GRAFT  Final   Special Requests PATIENT ON FOLLOWING ZINACEF,ZOSYN,VANC  Final   Gram Stain PENDING  Incomplete   Culture   Final    NO GROWTH 1 DAY Performed at [facility]    Report Status PENDING  Incomplete    Medical History: Past Medical History  Diagnosis Date  . Hypertension   . Diabetes mellitus   . Smoking   . Abnormal CT scan, kidney 10/06/2011  . UTI (lower urinary tract infection) 10/06/2011  . Bladder wall thickening 10/06/2011  . 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 Poor historian poor  historian  . BPH (benign prostatic hypertrophy)   . Anxiety     mental retardation  . Hypothyroidism   . MR (mental retardation)   . Acute pyelonephritis 10/07/2011  . Hyperkalemia   . Renal insufficiency     chronic history  . Renal failure (ARF), acute on chronic (HCC)   . Anemia     normocytic  . Edema      history of lower extremity edema  . Hyperlipidemia   . Impaired speech     Medications:  Prescriptions prior to admission  Medication Sig Dispense Refill Last Dose  . carvedilol (COREG) 6.25 MG tablet Take 1 tablet (6.25 mg total) by mouth 2 (two) times daily with a meal. 60 tablet 3 10/07/2015 at 2200  . ferrous sulfate 325 (65 FE) MG tablet Take 325 mg by mouth daily with breakfast.   10/07/2015 at 0800  . isosorbide-hydrALAZINE (BIDIL) 20-37.5 MG per tablet Take 1 tablet by mouth 3 (three) times daily. (Patient taking differently: Take 1 tablet by mouth 3 (three) times daily. 8am,2pm,8pm) 90 tablet 3 10/07/2015 at 2200  . levothyroxine (SYNTHROID, LEVOTHROID) 75 MCG tablet Take 75 mcg by mouth daily before breakfast.    10/07/2015 at 0800  . linagliptin (TRADJENTA) 5 MG TABS tablet Take 5 mg by mouth daily.   10/07/2015 at 0800  . niacin (NIASPAN) 1000 MG CR tablet Take 1,000 mg by  mouth at bedtime.   10/07/2015 at 2200  . omeprazole (PRILOSEC) 20 MG capsule Take 40 mg by mouth daily.   10/07/2015 at 0800  . simvastatin (ZOCOR) 20 MG tablet Take 20 mg by mouth at bedtime.    10/07/2015 at Unknown time  . sodium polystyrene (KAYEXALATE) 15 GM/60ML suspension Take 30 g by mouth. Daily on Monday and Friday   10/05/2015  . tamsulosin (FLOMAX) 0.4 MG CAPS capsule Take 0.4 mg by mouth daily.   10/07/2015 at 0800  . torsemide (DEMADEX) 20 MG tablet Take 20 mg by mouth daily. Daily at 8am   10/07/2015 at 0800  . Vitamin D, Ergocalciferol, (DRISDOL) 50000 UNITS CAPS capsule Take 1 capsule (50,000 Units total) by mouth every 7 (seven) days. (Patient taking differently: Take 50,000  Units by mouth every 7 (seven) days. On Monday) 30 capsule 3 10/01/2015  . HYDROcodone-acetaminophen (NORCO/VICODIN) 5-325 MG per tablet Take 1 tablet by mouth every 6 (six) hours as needed for moderate pain. (Patient not taking: Reported on 09/27/2015) 30 tablet 0 Not Taking   Scheduled:  . sodium chloride   Intravenous Once  . carvedilol  6.25 mg Oral BID WC  . enoxaparin (LOVENOX) injection  30 mg Subcutaneous Q24H  . ferrous sulfate  325 mg Oral Q breakfast  . isosorbide-hydrALAZINE  1 tablet Oral TID  . levothyroxine  75 mcg Oral QAC breakfast  . linagliptin  5 mg Oral Daily  . niacin  1,000 mg Oral QHS  . pantoprazole  40 mg Oral Daily  . piperacillin-tazobactam (ZOSYN)  IV  3.375 g Intravenous Q8H  . potassium chloride  20-40 mEq Oral Once  . simvastatin  20 mg Oral QHS  . tamsulosin  0.4 mg Oral Daily  . torsemide  20 mg Oral Daily  . vancomycin  1,000 mg Intravenous Q48H  . Vitamin D (Ergocalciferol)  50,000 Units Oral Q7 days    Assessment: 69 yo male with CKD and  here with a swollen left upper arm now s/p fistulogram (HD graft placed in Sept). Pharmacy has been consulted to dose vancomycin and zosyn for possible graft infection, D#3. WBC wnl, afebrile, SCr= 2.8>>3.18>>4.3 (2.54- 5.24 in the last 3 weeks), CrCl~18. Infected graft removed 12/13, no indication for HD at this time per Renal. Drain removed today - will need 1 more day of abx per Vasc Surgery. Vanc random level was therapeutic today (12) prior to 1st maintenance dose.  12/12 zosyn>> 12/12 vanc>>  VR 12/14: 12 12/12 wound cx>>ngtd  Goal of Therapy:  Vancomycin trough level 10-15 mcg/ml   Plan -Vanc 1g IV q48h -Zosyn to 2.25g IV q8h for renal function --Will follow renal function, clinical progress, c/s, VT prn -F/u LOT - may d/c abx tomorrow per MD note  Elicia Lamp, PharmD, BCPS Clinical Pharmacist Pager [phone] 10/10/2015 1:23 PM

## 2015-10-10 NOTE — Progress Notes (Signed)
   VASCULAR SURGERY ASSESSMENT & PLAN:  * 1 Day Post-Op s/p: Evacuation of lymphocele and removal of left upper arm AVG.  *  Drain removed.  * Will need another day of IV ABX  * Possibly home tomorrow with daily dressing changes  SUBJECTIVE: Pain well controlled  PHYSICAL EXAM: Filed Vitals:   10/09/15 1524 10/09/15 2000 10/10/15 0536 10/10/15 0805  BP: 90/52 111/56 111/53 112/48  Pulse: 88 103 122 114  Temp: 98.7 F (37.1 C) 98.6 F (37 C) 99.1 F (37.3 C) 99.7 F (37.6 C)  TempSrc: Oral Oral Oral Oral  Resp: 15 16 18 17   Height:      Weight:   194 lb 1.6 oz (88.043 kg)   SpO2: 100% 100% 94% 96%   Dressing changed Drain removed Moderate serous drainage. Forearm is warm. 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

## 2015-10-10 NOTE — Progress Notes (Signed)
10/10/2015 4:04 PM  Pt oral temperature during pre-blood transfusion vitals was 100.3.  Aldona Bar, PA with [facility] notified.  Orders received for administration of Tylenol prn and to proceed with blood transfusion.  Tylenol administered per prn order.  First unit of PRBC started at 1600. Will continue to monitor patient. Princella Pellegrini

## 2015-10-10 NOTE — Progress Notes (Signed)
CRITICAL VALUE ALERT  Critical value received:  HGB 5.6  Date of notification:  10/10/2015  Time of notification:  [id number]  Critical value read back:Yes.    Nurse who received alert:  Cassell Smiles, RN  MD notified (1st page):  Dr. Scot Dock  Time of first page:  1024  MD notified (2nd page): Dr. Scot Dock  Time of second page: 1035, 1050  Responding MD:  Dr. Scot Dock  Time MD responded:  1050

## 2015-10-11 ENCOUNTER — Telehealth: Payer: Self-pay | Admitting: Vascular Surgery

## 2015-10-11 ENCOUNTER — Encounter (HOSPITAL_COMMUNITY)
Admission: RE | Disposition: A | Discharge status: Home / Self Care | Payer: Self-pay | Source: Ambulatory Visit | Attending: Vascular Surgery

## 2015-10-11 LAB — CBC
HCT: 22.6 % — ABNORMAL LOW (ref 39.0–52.0)
Hemoglobin: 7.3 g/dL — ABNORMAL LOW (ref 13.0–17.0)
MCH: 30.7 pg (ref 26.0–34.0)
MCHC: 32.3 g/dL (ref 30.0–36.0)
MCV: 95 fL (ref 78.0–100.0)
Platelets: 153 10*3/uL (ref 150–400)
RBC: 2.38 MIL/uL — ABNORMAL LOW (ref 4.22–5.81)
RDW: 19.1 % — ABNORMAL HIGH (ref 11.5–15.5)
WBC: 10.9 10*3/uL — ABNORMAL HIGH (ref 4.0–10.5)

## 2015-10-11 LAB — RENAL FUNCTION PANEL
Albumin: 2.2 g/dL — ABNORMAL LOW (ref 3.5–5.0)
Anion gap: 11 (ref 5–15)
BUN: 47 mg/dL — ABNORMAL HIGH (ref 6–20)
CO2: 24 mmol/L (ref 22–32)
Calcium: 8.7 mg/dL — ABNORMAL LOW (ref 8.9–10.3)
Chloride: 108 mmol/L (ref 101–111)
Creatinine, Ser: 5 mg/dL — ABNORMAL HIGH (ref 0.61–1.24)
GFR calc Af Amer: 13 mL/min — ABNORMAL LOW (ref >60)
GFR calc non Af Amer: 11 mL/min — ABNORMAL LOW (ref >60)
Glucose, Bld: 112 mg/dL — ABNORMAL HIGH (ref 65–99)
Phosphorus: 4.8 mg/dL — ABNORMAL HIGH (ref 2.5–4.6)
Potassium: 4.4 mmol/L (ref 3.5–5.1)
Sodium: 143 mmol/L (ref 135–145)

## 2015-10-11 LAB — WOUND CULTURE: Culture: NO GROWTH

## 2015-10-11 SURGERY — INSERTION OF DIALYSIS CATHETER
Anesthesia: Monitor Anesthesia Care

## 2015-10-11 MED ORDER — HYDROCODONE-ACETAMINOPHEN 5-325 MG PO TABS: 1.0000 | ORAL_TABLET | Freq: Four times a day (QID) | ORAL | Status: DC | PRN

## 2015-10-11 NOTE — Telephone Encounter (Signed)
Had to lv msg with [place] scheduling

## 2015-10-11 NOTE — Telephone Encounter (Signed)
-----   Message from Mena Goes, RN sent at 10/11/2015  9:12 AM EST ----- Regarding: schedule   ----- Message -----    From: Alvia Grove, PA-C    Sent: 10/11/2015   8:02 AM      To: [facility] Charge Pool  S/p removal of left arm graft 10/09/15  F/u with [facility] in 1 week  Thanks Maudie Mercury

## 2015-10-11 NOTE — Progress Notes (Signed)
Report called to [name] at [facility].  All questions answered.

## 2015-10-11 NOTE — Progress Notes (Addendum)
   VASCULAR SURGERY ASSESSMENT & PLAN:  * 2 Days Post-Op s/p: Evacuation of lymphocele and removal of left upper arm AVG.  *  Possibly home today if daily dressing changes can be aranged (dry 4X4's, Kerlix, 4" ace)  * Should not need ABx  * Acute blood loss anemia. Received 2 units PRBC's yesterday. F/U CBC this AM.  SUBJECTIVE: Comfortable  PHYSICAL EXAM: Filed Vitals:   10/11/15 0004 10/11/15 0030 10/11/15 0245 10/11/15 0512  BP: 118/56 108/54 104/59 128/62  Pulse: 105 102 88 90  Temp: 99 F (37.2 C) 99.1 F (37.3 C) 98.7 F (37.1 C) 98.3 F (36.8 C)  TempSrc: Oral Oral Oral Oral  Resp: 18 17 18 19   Height:      Weight:      SpO2: 96% 97% 98% 99%   Dressing is dry.  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

## 2015-10-11 NOTE — Care Management Note (Signed)
Case Management Note  Patient Details  Name: Zachary [name] MRN: [id number] Date of Birth: 05-29-1946  Subjective/Objective:      CM following for progression and d/c planning.              Action/Plan: 10/11/15 Noted order for [facility] for dressing change, however per NP this is a dry dressing with ace wrap. This CM informed NP that insurance will not cover dry, simple dressing such as this. This CM spoke with director at pt residence, [facility] who stated that they could proved this wound care, she read the instructions and requested that supplies be sent with with the pt. This CM spoke with pt RN, Wells Guiles who stated that she wound send sterile 4X4s and extra ace wrap with the pt at the time of d/c.   Expected Discharge Date:    10/11/2015              Expected Discharge Plan:  Assisted Living / Rest Home  In-House Referral:  Clinical Social Work  Discharge planning Services  CM Consult  Post Acute Care Choice:  NA Choice offered to:  NA  DME Arranged:   NA DME Agency:   NA  HH Arranged:   NA HH Agency:   NA  Status of Service:  Completed, signed off  Medicare Important Message Given:  Yes Date Medicare IM Given:    Medicare IM give by:    Date Additional Medicare IM Given:    Additional Medicare Important Message give by:     If discussed at [place] of Stay Meetings, dates discussed:    Additional Comments:  Adron Bene, RN 10/11/2015, 4:06 PM

## 2015-10-11 NOTE — Care Management Important Message (Signed)
Important Message  Patient Details  Name: Zachary [name] MRN: [id number] Date of Birth: [birth year]   Medicare Important Message Given:  Yes    [name] 10/11/2015, 2:30 [place] Message  Patient Details  Name: Zachary [name] MRN: [id number] Date of Birth: Apr 10, 1946   Medicare Important Message Given:  Yes    [name] 10/11/2015, 2:30 PM

## 2015-10-11 NOTE — Progress Notes (Signed)
Shirlyn Goltz to be D/C'd Nursing Home per MD order.  Discussed prescriptions and follow up appointments with the patient. Prescriptions given to patient, medication list explained in detail. Pt verbalized understanding.    Medication List    TAKE these medications        carvedilol 6.25 MG tablet  Commonly known as:  COREG  Take 1 tablet (6.25 mg total) by mouth 2 (two) times daily with a meal.     ferrous sulfate 325 (65 FE) MG tablet  Take 325 mg by mouth daily with breakfast.     HYDROcodone-acetaminophen 5-325 MG tablet  Commonly known as:  NORCO/VICODIN  Take 1 tablet by mouth every 6 (six) hours as needed for moderate pain.     isosorbide-hydrALAZINE 20-37.5 MG tablet  Commonly known as:  BIDIL  Take 1 tablet by mouth 3 (three) times daily.     levothyroxine 75 MCG tablet  Commonly known as:  SYNTHROID, LEVOTHROID  Take 75 mcg by mouth daily before breakfast.     linagliptin 5 MG Tabs tablet  Commonly known as:  TRADJENTA  Take 5 mg by mouth daily.     niacin 1000 MG CR tablet  Commonly known as:  NIASPAN  Take 1,000 mg by mouth at bedtime.     omeprazole 20 MG capsule  Commonly known as:  PRILOSEC  Take 40 mg by mouth daily.     simvastatin 20 MG tablet  Commonly known as:  ZOCOR  Take 20 mg by mouth at bedtime.     sodium polystyrene 15 GM/60ML suspension  Commonly known as:  KAYEXALATE  Take 30 g by mouth. Daily on Monday and Friday     tamsulosin 0.4 MG Caps capsule  Commonly known as:  FLOMAX  Take 0.4 mg by mouth daily.     torsemide 20 MG tablet  Commonly known as:  DEMADEX  Take 20 mg by mouth daily. Daily at 8am     Vitamin D (Ergocalciferol) 50000 UNITS Caps capsule  Commonly known as:  DRISDOL  Take 1 capsule (50,000 Units total) by mouth every 7 (seven) days.        Filed Vitals:   10/11/15 0830 10/11/15 1603  BP: 127/63 114/66  Pulse: 90 96  Temp: 98.2 F (36.8 C) 99 F (37.2 C)  Resp: 18 17    Skin clean, dry and intact  without evidence of skin break down, no evidence of skin tears noted. IV catheter discontinued intact. Site without signs and symptoms of complications. Dressing and pressure applied. Pt denies pain at this time. No complaints noted.  An After Visit Summary was printed and given to the patient sealed in envelope. Patient escorted via [place], and D/C home via taxi.  Retta Mac BSN, RN

## 2015-10-11 NOTE — NC FL2 (Signed)
[place] LEVEL OF CARE SCREENING TOOL     IDENTIFICATION  Patient Name: Zachary [name] Birthdate: [birth year] Sex: male Admission Date (Current Location): 10/08/2015  [place] and [place] Number: [place] [id number] [place] and Address:  [facility] [place], [place] [address] [place], [place] [address]      Provider Number: [id number]  Attending Physician Name and Address:  Angelia Mould, MD  Relative Name and Phone Number:  Radene Gunning - Legal Guardian [phone])    Current Level of Care: Hospital Recommended Level of Care: [place] Prior Approval Number:    Date Approved/Denied:   PASRR Number:    Discharge Plan: Other (Comment) ([place])    Current Diagnoses: Patient Active Problem List   Diagnosis Date Noted  . End stage renal disease ([place]) 10/08/2015  . 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 Left arm pain 09/27/2015  . Cold sensation of skin-Left Hand 09/27/2015  . Left arm swelling 09/27/2015  . Pyelonephritis 09/13/2015  . UTI (lower urinary tract infection) 07/20/2015  . Heme positive stool   . Absolute anemia   . Abnormal CT scan, esophagus   . Protein-calorie malnutrition, severe ([place]) 07/10/2015  . Acute renal failure superimposed on stage 4 chronic kidney disease ([place]) 07/08/2015  . Nausea and vomiting 07/08/2015  . Essential hypertension 07/08/2015  . Hypernatremia 07/08/2015  . Hyperlipidemia 07/08/2015  . Nausea & vomiting 07/08/2015  . Hyperkalemia 05/22/2015  . Obstructive uropathy 04/17/2015  . ARF (acute renal failure) ([place]) 04/17/2015  . Bladder neck contracture   . Urinary retention   . Anemia, chronic disease 04/16/2015  . BPH (benign prostatic hypertrophy) with urinary retention 03/03/2015  . BPH (benign prostatic hypertrophy) with urinary obstruction 01/04/2014  . BPH (benign prostatic hypertrophy) 12/29/2013  . Acute renal failure ([place])  12/25/2013  . Renal insufficiency 12/25/2013  . Dehydration 12/25/2013  . Mental retardation 10/10/2011  . Acute pyelonephritis 10/07/2011  . 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 Bladder wall thickening 10/06/2011  . DM (diabetes mellitus), type 2, uncontrolled ([place]) 10/02/2011  . Iron deficiency anemia 10/02/2011  . Bacteremia 10/02/2011  . FX CLOSED FIBULA NOS 02/10/2008    Orientation RESPIRATION BLADDER Height & Weight    Place, Self  Normal Continent   194 lbs.  BEHAVIORAL SYMPTOMS/MOOD NEUROLOGICAL BOWEL NUTRITION STATUS      Continent Diet (Renal with 1200 m fluid restriction)  AMBULATORY STATUS COMMUNICATION OF NEEDS Skin   Independent Verbally Normal                       Personal Care Assistance Level of Assistance    Bathing Assistance: Independent   Dressing Assistance: Independent     Functional Limitations Info        Speech Info: Adequate    SPECIAL CARE FACTORS FREQUENCY                       Contractures Contractures Info: Not present    Additional Factors Info  Code Status Code Status Info: Full code             Current Medications (10/11/2015):  This is the current hospital active medication list Current Facility-Administered Medications  Medication Dose Route Frequency Provider Last Rate Last Dose  . 0.9 %  sodium chloride infusion   Intravenous Once Angelia Mould, MD      . acetaminophen (TYLENOL) tablet 650 mg  650 mg Oral Q6H  PRN Angelia Mould, MD   650 mg at 10/10/15 1544  . alum & mag hydroxide-simeth (MAALOX/MYLANTA) 200-200-20 MG/5ML suspension 15-30 mL  15-30 mL Oral Q2H PRN Angelia Mould, MD      . carvedilol (COREG) tablet 6.25 mg  6.25 mg Oral BID WC Angelia Mould, MD   6.25 mg at 10/11/15 [id number]  . enoxaparin (LOVENOX) injection 30 mg  30 mg Subcutaneous Q24H Angelia Mould, MD   30 mg at 10/10/15 2313  . ferrous sulfate tablet 325 mg  325 mg Oral Q breakfast Angelia Mould, MD   325 mg at 10/11/15 [id number]  . guaiFENesin-dextromethorphan (ROBITUSSIN DM) 100-10 MG/5ML syrup 15 mL  15 mL Oral Q4H PRN Angelia Mould, MD      . hydrALAZINE (APRESOLINE) injection 5 mg  5 mg Intravenous Q20 Min PRN Angelia Mould, MD      . HYDROcodone-acetaminophen (NORCO/VICODIN) 5-325 MG per tablet 1-2 tablet  1-2 tablet Oral Q4H PRN Alvia Grove, PA-C      . isosorbide-hydrALAZINE (BIDIL) 20-37.5 MG per tablet 1 tablet  1 tablet Oral TID Angelia Mould, MD   1 tablet at 10/11/15 [phone]  . labetalol (NORMODYNE,TRANDATE) injection 10 mg  10 mg Intravenous Q10 min PRN Angelia Mould, MD      . levothyroxine (SYNTHROID, LEVOTHROID) tablet 75 mcg  75 mcg Oral QAC breakfast Angelia Mould, MD   75 mcg at 10/11/15 [phone]  . linagliptin (TRADJENTA) tablet 5 mg  5 mg Oral Daily Angelia Mould, MD   5 mg at 10/11/15 0905  . metoprolol (LOPRESSOR) injection 2-5 mg  2-5 mg Intravenous Q2H PRN Angelia Mould, MD      . niacin (NIASPAN) CR tablet 1,000 mg  1,000 mg Oral QHS Angelia Mould, MD   1,000 mg at 10/10/15 2312  . ondansetron (ZOFRAN) injection 4 mg  4 mg Intravenous Q6H PRN Angelia Mould, MD   4 mg at 10/10/15 [id number]  . pantoprazole (PROTONIX) EC tablet 40 mg  40 mg Oral Daily Angelia Mould, MD   40 mg at 10/11/15 0905  . phenol (CHLORASEPTIC) mouth spray 1 spray  1 spray Mouth/Throat PRN Angelia Mould, MD      . piperacillin-tazobactam (ZOSYN) IVPB 2.25 g  2.25 g Intravenous Q8H Romona Curls, RPH   2.25 g at 10/11/15 1151  . potassium chloride SA (K-DUR,KLOR-CON) CR tablet 20-40 mEq  20-40 mEq Oral Once Angelia Mould, MD   20 mEq at 10/08/15 1310  . simvastatin (ZOCOR) tablet 20 mg  20 mg Oral QHS Angelia Mould, MD   20 mg at 10/10/15 2312  . tamsulosin (FLOMAX) capsule 0.4 mg  0.4 mg Oral Daily Angelia Mould, MD   0.4 mg at 10/11/15 0905  . torsemide (DEMADEX) tablet 20 mg  20 mg  Oral Daily Angelia Mould, MD   20 mg at 10/11/15 [id number]  . vancomycin (VANCOCIN) IVPB 1000 mg/200 mL premix  1,000 mg Intravenous Q48H Romona Curls, RPH   1,000 mg at 10/10/15 1323  . Vitamin D (Ergocalciferol) (DRISDOL) capsule 50,000 Units  50,000 Units Oral Q7 days Angelia Mould, MD   50,000 Units at 10/08/15 1327     Discharge Medications: Please see discharge summary for a list of discharge medications.  Relevant Imaging Results:  Relevant Lab Results:   Additional Information Discharge medications: carvedilol 6.25 MG tablet, Take 1 tablet (6.25 mg total)  by mouth 2 (two) times daily with a meal.ferrous sulfate 325 (65 FE) MG tablet, Take 325 mg by mouth daily with breakfast. isosorbide-hydrALAZINE 20-37.5 MG tablet, Take 1 tablet by mouth 3 (three) times daily.  Levothyroxinre 75 MCG -Take 75 mcg by mouth daily before breakfast.  linagliptin 5 MG Tabs tablet-Take 5 mg by mouth daily. niacin 1000 MG CR tablet-Take 1,000 mg by mouth at bedtime.  Omeprazole 20 MG capsule-Take 40 mg by mouth daily. simvastatin 20 MF table-Take 20 mg by mouth at bedtime.  sodium polystyrene 15 GM/60ML suspension-Take 30 g by mouth. Daily on Monday and Friday. tamsulosin 0.4 MG Caps capsule-Take 0.4 mg by mouth daily. torsemide 20 MG tablet- Vitamin D (Ergocalciferol) 50000 Units Caps capsule-Take 1 capsule (50,000 Units total) by mouth every 7 (seven) days. HYDROcodone-acetaminophen 5-325 MG tablet-Take 1 tablet by mouth every 6 (six) hours as needed for moderate pain.  Sable Feil, LCSW

## 2015-10-11 NOTE — Clinical Social Work Note (Signed)
Clinical Social Work Assessment  Patient Details  Name: Zachary [name] MRN: [id number] Date of Birth: [birth year]  Date of referral:  10/08/15               Reason for consult:  Facility Placement                Permission sought to share information with:  Other (Patient has a guardian - SW [employer] with [employer]. DSS) Permission granted to share information::  No  Name::     Malinda Spell  Agency::     Relationship::  SW and [place] DSS  Contact Information:  [phone]  Housing/Transportation Living arrangements for the past 2 months:  [place] of Information:  Guardian Patient Interpreter Needed:  None Criminal Activity/Legal Involvement Pertinent to Current Situation/Hospitalization:  No - Comment as needed Significant Relationships:  Siblings, Other(Comment) (Guardian with DSS) Lives with:  Facility Resident ([place], [place]) Do you feel safe going back to the place where you live?  Yes Need for family participation in patient care:  Yes (Comment) (Guardian handles patient's affaris)  Care giving concerns:  None expressed by ALF staff   Social Worker assessment / plan:  Patient from [facility] long term care facility (ALF) and ready for discharge today. Facility accepting patient back and patient SW and legal guardian with [place] [phone]) contacted and informed of d/c.  Employment status:  Retired Forensic scientist:  Information systems manager, Medicaid In [place] PT Recommendations:  Not assessed at this time [place] / Referral to community resources:  Other (Comment Required) (None needed or requested at this time)  Patient/Family's Response to care: Facility staff does not want to administer pain medication on d/c summary. MD would not remove as stated patient needed medication for pain and facility given option not to give to patient.  Patient/Family's  Understanding of and Emotional Response to Diagnosis, Current Treatment, and Prognosis: Not discussed.  Emotional Assessment Appearance:  Other (Comment Required (Did not observe patient) Attitude/Demeanor/Rapport:  Unable to Assess Affect (typically observed):  Unable to Assess Orientation:  Oriented to Self, Oriented to Place Alcohol / Substance use:  Other (Unknown) Psych involvement (Current and /or in the community):  No (Comment)  Discharge Needs  Concerns to be addressed:  Other (Comment Required (Patient returning to ALF) Readmission within the last 30 days:  Yes Current discharge risk:  None Barriers to Discharge:  No Barriers Identified   Sable Feil, LCSW 10/11/2015, 4:41 PM

## 2015-10-11 NOTE — Clinical Social Work Note (Addendum)
Patient discharged back to [facility] in [place] (ALF). Discharge information transmitted to facility and patient transported by cab as facility staff could not provide transportation for patient and declined patient being transported by ambulance.  Patient's legal guardian-Melinda Spell contacted and informed of discharge.  CSW contacted [facility] to inform them that patient in route (by cab) back to facility.  Kenyatte Chatmon Givens, MSW, LCSW Licensed Clinical Social Worker [place] [phone]

## 2015-10-11 NOTE — Progress Notes (Signed)
Patient ID: Zachary [name], male   DOB: [birth year], 69 y.o.   MRN: [id number] S:no new complaints O:BP 127/63 mmHg  Pulse 90  Temp(Src) 98.2 F (36.8 C) (Oral)  Resp 18  Ht 5\' 8"  (1.727 m)  Wt 88.1 kg (194 lb 3.6 oz)  BMI 29.54 kg/m2  SpO2 98%  Intake/Output Summary (Last 24 hours) at 10/11/15 0858 Last data filed at 10/11/15 0600  Gross per 24 hour  Intake    550 ml  Output      1 ml  Net    549 ml   Intake/Output: I/O last 3 completed shifts: In: 650 [I.V.:250; Blood:300; IV Piggyback:100] Out: 52 [Urine:50; Emesis/NG output:1; Stool:1]  Intake/Output this shift:    Weight change: 0.057 kg (2 oz) Gen:AAM in NAD CVS:no rub, II/VI SEM Resp:cta [id number] arm +edema, no lower ext edema   Recent Labs Lab 10/08/15 0949 10/08/15 1241 10/09/15 0547 10/10/15 0955 10/11/15 0735  NA 140 140 142 141 143  K 4.1 4.1 4.2 4.3 4.4  CL 102 106 107 109 108  CO2  --  26 27 23 24   GLUCOSE 97 99 98 124* 112*  BUN 35* 34* 35* 42* 47*  CREATININE 2.80* 2.85* 3.18* 4.30* 5.00*  ALBUMIN  --  2.8* 2.4* 2.3* 2.2*  CALCIUM  --  8.9 8.7* 8.7* 8.7*  PHOS  --   --  3.2 4.2 4.8*  AST  --  16  --   --   --   ALT  --  12*  --   --   --    Liver Function Tests:  Recent Labs Lab 10/08/15 1241 10/09/15 0547 10/10/15 0955 10/11/15 0735  AST 16  --   --   --   ALT 12*  --   --   --   ALKPHOS 66  --   --   --   BILITOT 0.7  --   --   --   PROT 6.3*  --   --   --   ALBUMIN 2.8* 2.4* 2.3* 2.2*   No results for input(s): LIPASE, AMYLASE in the last 168 hours. No results for input(s): AMMONIA in the last 168 hours. CBC:  Recent Labs Lab 10/08/15 1241 10/09/15 0547 10/10/15 0955 10/11/15 0735  WBC 6.5 6.8 9.0 10.9*  HGB 8.3* 7.9* 5.6* 7.3*  HCT 25.4* 24.5* 17.5* 22.6*  MCV 97.3 97.6 100.0 95.0  PLT 204 191 166 153   Cardiac Enzymes: No results for input(s): CKTOTAL, CKMB, CKMBINDEX, TROPONINI in the last 168 hours. CBG:  Recent Labs Lab 10/09/15 1213  GLUCAP  125*    Iron Studies:  Recent Labs  10/09/15 0547  IRON 29*  TIBC 157*  FERRITIN 254   Studies/Results: No results found. . sodium chloride   Intravenous Once  . carvedilol  6.25 mg Oral BID WC  . enoxaparin (LOVENOX) injection  30 mg Subcutaneous Q24H  . ferrous sulfate  325 mg Oral Q breakfast  . isosorbide-hydrALAZINE  1 tablet Oral TID  . levothyroxine  75 mcg Oral QAC breakfast  . linagliptin  5 mg Oral Daily  . niacin  1,000 mg Oral QHS  . pantoprazole  40 mg Oral Daily  . piperacillin-tazobactam (ZOSYN)  IV  2.25 g Intravenous Q8H  . potassium chloride  20-40 mEq Oral Once  . simvastatin  20 mg Oral QHS  . tamsulosin  0.4 mg Oral Daily  . torsemide  20 mg Oral Daily  . vancomycin  1,000 mg Intravenous Q48H  . Vitamin D (Ergocalciferol)  50,000 Units Oral Q7 days    BMET    Component Value Date/Time   NA 143 10/11/2015 0735   K 4.4 10/11/2015 0735   CL 108 10/11/2015 0735   CO2 24 10/11/2015 0735   GLUCOSE 112* 10/11/2015 0735   BUN 47* 10/11/2015 0735   CREATININE 5.00* 10/11/2015 0735   CALCIUM 8.7* 10/11/2015 0735   CALCIUM 8.3* 03/03/2015 0622   GFRNONAA 11* 10/11/2015 0735   GFRAA 13* 10/11/2015 0735   CBC    Component Value Date/Time   WBC 9.0 10/10/2015 0955   RBC 1.75* 10/10/2015 0955   RBC 2.24* 03/02/2015 1417   HGB 5.6* 10/10/2015 0955   HCT 17.5* 10/10/2015 0955   PLT 166 10/10/2015 0955   MCV 100.0 10/10/2015 0955   MCH 32.0 10/10/2015 0955   MCHC 32.0 10/10/2015 0955   RDW 16.5* 10/10/2015 0955   LYMPHSABS 0.8 09/13/2015 1442   MONOABS 1.4* 09/13/2015 1442   EOSABS 0.0 09/13/2015 1442   BASOSABS 0.0 09/13/2015 1442     Assessment/Plan: 1. Complication of Vascular access- his left arm is markedly edematous, warm, and tender. No evidence of central stenosis by shuntogram. 1. s/p exploration in OR today by Dr. Scot Dock and removal of AVG due to seroma and lymphatic drainage 2. His Scr was better than his baseline prior to ABLA  but now back up to 5, hold off on tunneled HD catheter for now as there is no indication for dialysis at this time. Dr. Lowanda Foster can continue to monitor as an outpatient and refer back to [facility] when he is closer to HD. 2. AKI/CKD stage 4- Scr jumped following ABLA. No indication for dialysis at this time and will continue to follow. Hopefully this is ischemic ATN and will not significantly impact his renal function. 1. AKI/CKD likely related to ABLA and profound anemia and relative hypotension.  2. Will transfuse and follow renal function 3. If discharged to home today, will need to follow up with Dr. Lowanda Foster and if Scr continues to rise, will need TDC placed and then arrange for another access as an outpatient 3. ABLA/Anemia of chronic disease- Hgb dropped to 5.6 post-op. Patient asymptomatic 1. Plan for blood transfusion x 2 units PRBC's and follow H/H.  Hgb increased appropriately to 7.3 2. Will need follow up as an outpatient with Dr. Lowanda Foster 4. HTN- continue with meds. 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 Hypothyroidism- cont with replacement therapy.  [facility]

## 2015-10-11 NOTE — Discharge Summary (Signed)
[facility] Discharge Summary  Zachary [name] July 13, 1946 69 y.o. 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 male of Dr. Scot [name] who is s/p left upper arm AV graft placed with a 4-7 mm PTFE graft on 07/06/2015. He is not yet on dialysis. Zachary [name] is a resident of [facility] in [place]. He has a hx of mental retardation and DM. Dr. Scot [name] last saw pt on 08/08/15. At that time the graft was working well although he did have moderate left upper extremity swelling. Dr. Scot [name] wrote instructions for him to elevate his arm at the nursing facility. If this does not improve or worsens he may ultimately require a fistulogram to look for central venous stenosis which might be amenable to venoplasty. The pt was to return as needed.  He returns today at the request of Dr. Legrand [name] with reported swelling, induration, and tenderness at left upper arm. The pt denies fever or chills, denies cold sensation or numbness in his left hand. His oral temperature today is 99.1.  The nursing facility staff member with pt states she is not certain, but does not think his left arm has been elevated very much. He was started on Augmentin 500/125 yesterday, bid for 7 days.  Hospital Course:  The patient was admitted to the hospital and taken to the operating room on 10/09/2015 and underwent: 1. 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 Removal of left upper arm AV graft 3. Vein patch into plasty of brachial artery  The patient tolerated the procedure well and was transported to the PACU in good condition.   A tunneled dialysis catheter was not placed as his serum creatinine was below his baseline.   His drain was removed on POD 1. He was  continued on IV antibiotics. He had acute blood loss anemia and received 2 units of pRBCs.   POD 2: His follow up hemoglobin was stable. His antibiotics were discontinued. He was discharged back to his SNF in good condition with instructions on daily dressing changes to be completed by the facility staff.      CBC    Component Value Date/Time   WBC 9.0 10/10/2015 0955   RBC 1.75* 10/10/2015 0955   RBC 2.24* 03/02/2015 1417   HGB 5.6* 10/10/2015 0955   HCT 17.5* 10/10/2015 0955   PLT 166 10/10/2015 0955   MCV 100.0 10/10/2015 0955   MCH 32.0 10/10/2015 0955   MCHC 32.0 10/10/2015 0955   RDW 16.5* 10/10/2015 0955   LYMPHSABS 0.8 09/13/2015 1442   MONOABS 1.4* 09/13/2015 1442   EOSABS 0.0 09/13/2015 1442   BASOSABS 0.0 09/13/2015 1442    BMET    Component Value Date/Time   NA 141 10/10/2015 0955   K 4.3 10/10/2015 0955   CL 109 10/10/2015 0955   CO2 23 10/10/2015 0955   GLUCOSE 124* 10/10/2015 0955   BUN 42* 10/10/2015 0955   CREATININE 4.30* 10/10/2015 0955   CALCIUM 8.7* 10/10/2015 0955   CALCIUM 8.3* 03/03/2015 0622   GFRNONAA 13* 10/10/2015 0955   GFRAA 15* 10/10/2015 0955     Discharge Instructions:   The patient is discharged to SNF with extensive instructions on wound care and progressive ambulation.  They are instructed not to drive or perform  any heavy lifting until returning to see the physician in his office.  Discharge Instructions    Call MD for:  redness, tenderness, or signs of infection (pain, swelling, bleeding, redness, odor or green/yellow discharge around incision site)    Complete by:  As directed      Call MD for:  severe or increased pain, loss or decreased feeling  in affected limb(s)    Complete by:  As directed      Call MD for:  temperature >100.5    Complete by:  As directed      Discharge wound care:    Complete by:  As directed   Wash wounds daily with soap and water and pat dry. Apply dry 4 x 4s to left antecubital and mid upper arm  incisions daily. Wrap with kerlix and ACE wrap daily.     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 End stage renal disease ([place]) 10/08/2015  . 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 Left arm pain 09/27/2015  . Cold sensation of skin-Left Hand 09/27/2015  . Left arm swelling 09/27/2015  . Pyelonephritis 09/13/2015  . UTI (lower urinary tract infection) 07/20/2015  . Heme positive stool   . Absolute anemia   . Abnormal CT scan, esophagus   . Protein-calorie malnutrition, severe ([place]) 07/10/2015  . Acute renal failure superimposed on stage 4 chronic kidney disease ([place]) 07/08/2015  . Nausea and vomiting 07/08/2015  . Essential hypertension 07/08/2015  . Hypernatremia 07/08/2015  . Hyperlipidemia 07/08/2015  . Nausea & vomiting 07/08/2015  . Hyperkalemia 05/22/2015  . Obstructive uropathy 04/17/2015  . ARF (acute renal failure) ([place]) 04/17/2015  . Bladder neck contracture   . Urinary retention   . Anemia, chronic disease 04/16/2015  . BPH (benign prostatic hypertrophy) with urinary retention 03/03/2015  . BPH (benign prostatic hypertrophy) with urinary obstruction 01/04/2014  . BPH (benign prostatic hypertrophy) 12/29/2013  . Acute renal failure ([place]) 12/25/2013  . Renal insufficiency 12/25/2013  . Dehydration 12/25/2013  . Mental retardation 10/10/2011  . Acute pyelonephritis 10/07/2011  . 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 Bladder wall thickening 10/06/2011  . DM (diabetes mellitus), type 2, uncontrolled ([place]) 10/02/2011  . Iron deficiency anemia  10/02/2011  . Bacteremia 10/02/2011  . FX CLOSED FIBULA NOS 02/10/2008   Past Medical History  Diagnosis Date  . Hypertension   . Diabetes mellitus   . Smoking   . Abnormal CT scan, kidney 10/06/2011  . UTI (lower urinary tract infection) 10/06/2011  . Bladder wall thickening 10/06/2011  . 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 Poor historian poor historian  . BPH (benign prostatic hypertrophy)   . Anxiety     mental retardation  . Hypothyroidism   . MR (mental retardation)   . Acute pyelonephritis 10/07/2011  . Hyperkalemia   . Renal insufficiency     chronic history  . Renal failure (ARF), acute on chronic (HCC)   . Anemia  normocytic  . Edema      history of lower extremity edema  . Hyperlipidemia   . Impaired speech        Medication List    TAKE these medications        carvedilol 6.25 MG tablet  Commonly known as:  COREG  Take 1 tablet (6.25 mg total) by mouth 2 (two) times daily with a meal.     ferrous sulfate 325 (65 FE) MG tablet  Take 325 mg by mouth daily with breakfast.     HYDROcodone-acetaminophen 5-325 MG tablet  Commonly known as:  NORCO/VICODIN  Take 1 tablet by mouth every 6 (six) hours as needed for moderate pain.     isosorbide-hydrALAZINE 20-37.5 MG tablet  Commonly known as:  BIDIL  Take 1 tablet by mouth 3 (three) times daily.     levothyroxine 75 MCG tablet  Commonly known as:  SYNTHROID, LEVOTHROID  Take 75 mcg by mouth daily before breakfast.     linagliptin 5 MG Tabs tablet  Commonly known as:  TRADJENTA  Take 5 mg by mouth daily.     niacin 1000 MG CR tablet  Commonly known as:  NIASPAN  Take 1,000 mg by mouth at bedtime.     omeprazole 20 MG capsule  Commonly known as:  PRILOSEC  Take 40 mg by mouth daily.     simvastatin 20 MG tablet  Commonly known as:  ZOCOR  Take 20 mg by mouth at bedtime.     sodium polystyrene 15 GM/60ML suspension  Commonly known as:  KAYEXALATE  Take 30 g by mouth. Daily on Monday and  Friday     tamsulosin 0.4 MG Caps capsule  Commonly known as:  FLOMAX  Take 0.4 mg by mouth daily.     torsemide 20 MG tablet  Commonly known as:  DEMADEX  Take 20 mg by mouth daily. Daily at 8am     Vitamin D (Ergocalciferol) 50000 UNITS Caps capsule  Commonly known as:  DRISDOL  Take 1 capsule (50,000 Units total) by mouth every 7 (seven) days.        Vicodin #30 No Refill  Disposition: SNF  Patient's condition: is Good  Follow up: 1. Dr. Scot [name] in 1 week   Virgina Jock, PA-C [facility] [phone] 10/11/2015  8:10 AM

## 2015-10-11 NOTE — Discharge Summary (Signed)
[facility] Discharge Summary  Zachary [name] [birth year] 69 y.o. 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 male of Zachary [name] who is s/p left upper arm AV graft placed with a 4-7 mm PTFE graft on 07/06/2015. He is not yet on dialysis. Zachary [name] is a resident of [facility] in [place]. He has a hx of mental retardation and DM. Zachary [name] last saw pt on 08/08/15. At that time the graft was working well although he did have moderate left upper extremity swelling. Zachary [name] wrote instructions for him to elevate his arm at the nursing facility. If this does not improve or worsens he may ultimately require a fistulogram to look for central venous stenosis which might be amenable to venoplasty. The pt was to return as needed.  He returns today at the request of Zachary [name] with reported swelling, induration, and tenderness at left upper arm. The pt denies fever or chills, denies cold sensation or numbness in his left hand. His oral temperature today is 99.1.  The nursing facility staff member with pt states she is not certain, but does not think his left arm has been elevated very much. He was started on Augmentin 500/125 yesterday, bid for 7 days.  Hospital Course:  The patient was admitted to the hospital and taken to the operating room on 10/09/2015 and underwent: 1. 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 Removal of left upper arm AV graft 3. Vein patch into plasty of brachial artery  The patient tolerated the procedure well and was transported to the PACU in good condition.   A tunneled dialysis catheter was not placed as his serum creatinine was below his baseline.   His drain was removed on POD 1. He was  continued on IV antibiotics. He had acute blood loss anemia and received 2 units of pRBCs.   POD 2: His follow up hemoglobin was stable. His antibiotics were discontinued. He was discharged back to his SNF in good condition with instructions on daily dressing changes to be completed by the facility staff.      CBC    Component Value Date/Time   WBC 10.9* 10/11/2015 0735   RBC 2.38* 10/11/2015 0735   RBC 2.24* 03/02/2015 1417   HGB 7.3* 10/11/2015 0735   HCT 22.6* 10/11/2015 0735   PLT 153 10/11/2015 0735   MCV 95.0 10/11/2015 0735   MCH 30.7 10/11/2015 0735   MCHC 32.3 10/11/2015 0735   RDW 19.1* 10/11/2015 0735   LYMPHSABS 0.8 09/13/2015 1442   MONOABS 1.4* 09/13/2015 1442   EOSABS 0.0 09/13/2015 1442   BASOSABS 0.0 09/13/2015 1442    BMET    Component Value Date/Time   NA 143 10/11/2015 0735   K 4.4 10/11/2015 0735   CL 108 10/11/2015 0735   CO2 24 10/11/2015 0735   GLUCOSE 112* 10/11/2015 0735   BUN 47* 10/11/2015 0735   CREATININE 5.00* 10/11/2015 0735   CALCIUM 8.7* 10/11/2015 0735   CALCIUM 8.3* 03/03/2015 0622   GFRNONAA 11* 10/11/2015 0735   GFRAA 13* 10/11/2015 0735     Discharge Instructions:   The patient is discharged to SNF with extensive instructions on wound care and progressive ambulation.  They are instructed not to drive or perform  any heavy lifting until returning to see the physician in his office.      Discharge Instructions    Call MD for:  redness, tenderness, or signs of infection (pain, swelling, bleeding, redness, odor or green/yellow discharge around incision site)    Complete by:  As directed      Call MD for:  severe or increased pain, loss or decreased feeling  in affected limb(s)    Complete by:  As directed      Call MD for:  temperature >100.5    Complete by:  As directed      Discharge wound care:    Complete by:  As directed   Wash wounds daily with soap and water and pat dry. Apply dry 4 x 4s to left antecubital and mid upper  arm incisions daily. Wrap with kerlix and ACE wrap daily.     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 End stage renal disease ([place]) 10/08/2015  . 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 Left arm pain 09/27/2015  . Cold sensation of skin-Left Hand 09/27/2015  . Left arm swelling 09/27/2015  . Pyelonephritis 09/13/2015  . UTI (lower urinary tract infection) 07/20/2015  . Heme positive stool   . Absolute anemia   . Abnormal CT scan, esophagus   . Protein-calorie malnutrition, severe ([place]) 07/10/2015  . Acute renal failure superimposed on stage 4 chronic kidney disease ([place]) 07/08/2015  . Nausea and vomiting 07/08/2015  . Essential hypertension 07/08/2015  . Hypernatremia 07/08/2015  . Hyperlipidemia 07/08/2015  . Nausea & vomiting 07/08/2015  . Hyperkalemia 05/22/2015  . Obstructive uropathy 04/17/2015  . ARF (acute renal failure) ([place]) 04/17/2015  . Bladder neck contracture   . Urinary retention   . Anemia, chronic disease 04/16/2015  . BPH (benign prostatic hypertrophy) with urinary retention 03/03/2015  . BPH (benign prostatic hypertrophy) with urinary obstruction 01/04/2014  . BPH (benign prostatic hypertrophy) 12/29/2013  . Acute renal failure ([place]) 12/25/2013  . Renal insufficiency 12/25/2013  . Dehydration 12/25/2013  . Mental retardation 10/10/2011  . Acute pyelonephritis 10/07/2011  . 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 Bladder wall thickening 10/06/2011  . DM (diabetes mellitus), type 2, uncontrolled ([place]) 10/02/2011  . Iron deficiency  anemia 10/02/2011  . Bacteremia 10/02/2011  . FX CLOSED FIBULA NOS 02/10/2008   Past Medical History  Diagnosis Date  . Hypertension   . Diabetes mellitus   . Smoking   . Abnormal CT scan, kidney 10/06/2011  . UTI (lower urinary tract infection) 10/06/2011  . Bladder wall thickening 10/06/2011  . 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 Poor historian poor historian  . BPH (benign prostatic hypertrophy)   . Anxiety     mental retardation  . Hypothyroidism   . MR (mental retardation)   . Acute pyelonephritis 10/07/2011  . Hyperkalemia   . Renal insufficiency     chronic history  . Renal failure (ARF), acute on chronic (HCC)   .  Anemia     normocytic  . Edema      history of lower extremity edema  . Hyperlipidemia   . Impaired speech        Medication List    TAKE these medications        carvedilol 6.25 MG tablet  Commonly known as:  COREG  Take 1 tablet (6.25 mg total) by mouth 2 (two) times daily with a meal.     ferrous sulfate 325 (65 FE) MG tablet  Take 325 mg by mouth daily with breakfast.     HYDROcodone-acetaminophen 5-325 MG tablet  Commonly known as:  NORCO/VICODIN  Take 1 tablet by mouth every 6 (six) hours as needed for moderate pain.     isosorbide-hydrALAZINE 20-37.5 MG tablet  Commonly known as:  BIDIL  Take 1 tablet by mouth 3 (three) times daily.     levothyroxine 75 MCG tablet  Commonly known as:  SYNTHROID, LEVOTHROID  Take 75 mcg by mouth daily before breakfast.     linagliptin 5 MG Tabs tablet  Commonly known as:  TRADJENTA  Take 5 mg by mouth daily.     niacin 1000 MG CR tablet  Commonly known as:  NIASPAN  Take 1,000 mg by mouth at bedtime.     omeprazole 20 MG capsule  Commonly known as:  PRILOSEC  Take 40 mg by mouth daily.     simvastatin 20 MG tablet  Commonly known as:  ZOCOR  Take 20 mg by mouth at bedtime.     sodium polystyrene 15 GM/60ML suspension  Commonly known as:  KAYEXALATE  Take 30 g by mouth. Daily on Monday  and Friday     tamsulosin 0.4 MG Caps capsule  Commonly known as:  FLOMAX  Take 0.4 mg by mouth daily.     torsemide 20 MG tablet  Commonly known as:  DEMADEX  Take 20 mg by mouth daily. Daily at 8am     Vitamin D (Ergocalciferol) 50000 UNITS Caps capsule  Commonly known as:  DRISDOL  Take 1 capsule (50,000 Units total) by mouth every 7 (seven) days.        Vicodin #30 No Refill  Disposition: ALF ([place])  Patient's condition: is Good  Follow up: 1. Zachary [name] in 1 week   Virgina Jock, PA-C [facility] [phone] 10/11/2015  3:24 PM

## 2015-10-12 ENCOUNTER — Encounter: Payer: Self-pay | Admitting: Vascular Surgery

## 2015-10-12 ENCOUNTER — Encounter: Payer: Self-pay | Admitting: *Deleted

## 2015-10-12 LAB — TYPE AND SCREEN
ABO/RH(D): B POS
Antibody Screen: NEGATIVE
Unit division: 0
Unit division: 0

## 2015-10-14 LAB — ANAEROBIC CULTURE

## 2015-10-15 ENCOUNTER — Emergency Department (HOSPITAL_COMMUNITY)
Admission: EM | Admit: 2015-10-15 | Discharge: 2015-10-15 | Disposition: A | Discharge status: Home / Self Care | Payer: Medicare Other | Attending: Emergency Medicine | Admitting: Emergency Medicine

## 2015-10-15 ENCOUNTER — Encounter (HOSPITAL_COMMUNITY): Payer: Self-pay | Admitting: Emergency Medicine

## 2015-10-15 ENCOUNTER — Telehealth: Payer: Self-pay | Admitting: *Deleted

## 2015-10-15 DIAGNOSIS — R4182 Altered mental status, unspecified: Secondary | ICD-10-CM | POA: Diagnosis not present

## 2015-10-15 DIAGNOSIS — E785 Hyperlipidemia, unspecified: Secondary | ICD-10-CM | POA: Diagnosis not present

## 2015-10-15 DIAGNOSIS — Z79899 Other long term (current) drug therapy: Secondary | ICD-10-CM | POA: Insufficient documentation

## 2015-10-15 DIAGNOSIS — T8131XA Disruption of external operation (surgical) wound, not elsewhere classified, initial encounter: Secondary | ICD-10-CM | POA: Insufficient documentation

## 2015-10-15 DIAGNOSIS — T8130XA Disruption of wound, unspecified, initial encounter: Secondary | ICD-10-CM

## 2015-10-15 DIAGNOSIS — D649 Anemia, unspecified: Secondary | ICD-10-CM | POA: Insufficient documentation

## 2015-10-15 DIAGNOSIS — Z8659 Personal history of other mental and behavioral disorders: Secondary | ICD-10-CM | POA: Insufficient documentation

## 2015-10-15 DIAGNOSIS — E119 Type 2 diabetes mellitus without complications: Secondary | ICD-10-CM | POA: Insufficient documentation

## 2015-10-15 DIAGNOSIS — N400 Enlarged prostate without lower urinary tract symptoms: Secondary | ICD-10-CM | POA: Insufficient documentation

## 2015-10-15 DIAGNOSIS — Y828 Other medical devices associated with adverse incidents: Secondary | ICD-10-CM | POA: Diagnosis not present

## 2015-10-15 DIAGNOSIS — I10 Essential (primary) hypertension: Secondary | ICD-10-CM | POA: Insufficient documentation

## 2015-10-15 DIAGNOSIS — Z87448 Personal history of other diseases of urinary system: Secondary | ICD-10-CM | POA: Insufficient documentation

## 2015-10-15 DIAGNOSIS — Z87440 Personal history of urinary (tract) infections: Secondary | ICD-10-CM | POA: Insufficient documentation

## 2015-10-15 DIAGNOSIS — Z4801 Encounter for change or removal of surgical wound dressing: Secondary | ICD-10-CM | POA: Diagnosis present

## 2015-10-15 NOTE — ED Notes (Signed)
PT from [facility] and was brought in today bc on assessment his Med Tech observed his surgical incision has drainage and open arm to left upper arm s/p graft removal from 10/09/15. PT denies any pain to area.

## 2015-10-15 NOTE — ED Notes (Addendum)
Report given to [name], LPN at [facility]. Awaiting ride from facility to take him home.

## 2015-10-15 NOTE — ED Notes (Signed)
Wound cleaned and dressings applied. Pt tolerated well.

## 2015-10-15 NOTE — Telephone Encounter (Signed)
[name], nurse at [facility] states that patient's left upper arm wound has dehisced as of this morning. She states that the sound is open approximately 2 inches and is draining profusely. I advised her to take Zachary [name] to the ED for evaluation. She said it is their protocol to take him to [facility] for evaluation.

## 2015-10-15 NOTE — ED Provider Notes (Addendum)
CSN: [id number]     Arrival date & time 10/15/15  1137 History  By signing my name below, I, Terressa Koyanagi, attest that this documentation has been prepared under the direction and in the presence of Milton Ferguson, MD. Electronically Signed: Terressa Koyanagi, ED Scribe. 10/15/2015. 12:21 PM.  LEVEL 5: AMS  Chief Complaint  Patient presents with  . Wound Check   Patient is a 70 y.o. male presenting with wound check. The history is provided by medical records and a caregiver (Med Rogers). No language interpreter was used.  Wound Check   PCP: [name], MD HPI Comments: Zachary [name] is a 69 y.o. male, with PMHx noted below, arriving from [facility], who presents to the Emergency Department for a wound check. Pt had a graft removed from his left arm on 10/09/15 and there is drainage from incision site.   Past Medical History  Diagnosis Date  . Hypertension   . Diabetes mellitus   . Smoking   . Abnormal CT scan, kidney 10/06/2011  . UTI (lower urinary tract infection) 10/06/2011  . Bladder wall thickening 10/06/2011  . 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 Poor historian poor historian  . BPH (benign prostatic hypertrophy)   . Anxiety     mental retardation  . Hypothyroidism   . MR (mental retardation)   . Acute pyelonephritis 10/07/2011  . Hyperkalemia   . Renal insufficiency     chronic history  . Renal failure (ARF), acute on chronic (HCC)   . Anemia     normocytic  . Edema      history of lower extremity edema  . Hyperlipidemia   . Impaired speech    Past Surgical History  Procedure Laterality Date  . Cystoscopy with urethral dilatation N/A 12/29/2013    Procedure: CYSTOSCOPY WITH URETHRAL DILATATION;  Surgeon: Marissa Nestle, MD;  Location: [facility];  Service: Urology;  Laterality: N/A;  . Transurethral resection of prostate N/A 01/04/2014    Procedure: TRANSURETHRAL RESECTION OF THE PROSTATE (TURP) (procedure #2);  Surgeon: Marissa Nestle, MD;  Location: [facility];   Service: Urology;  Laterality: N/A;  . Circumcision N/A 01/04/2014    Procedure: CIRCUMCISION ADULT (procedure #1);  Surgeon: Marissa Nestle, MD;  Location: [facility];  Service: Urology;  Laterality: N/A;  . Cystoscopy w/ retrogrades Bilateral 06/29/2015    Procedure: CYSTOSCOPY, DILATION OF URETHRAL STRICTURE WITH BILATERAL RETROGRADE PYELOGRAM,SUPRAPUBIC TUBE CHANGE;  Surgeon: Festus Aloe, MD;  Location: [facility];  Service: Urology;  Laterality: Bilateral;  . Av fistula placement Left 07/06/2015    Procedure:  INSERTION LEFT ARM ARTERIOVENOUS GORTEX GRAFT;  Surgeon: Angelia Mould, MD;  Location: [employer] [facility];  Service: Vascular;  Laterality: Left;  . Peripheral vascular catheterization N/A 10/08/2015    Procedure: A/V Shuntogram;  Surgeon: Angelia Mould, MD;  Location: [facility];  Service: Cardiovascular;  Laterality: N/A;  . Avgg removal Left 10/09/2015    Procedure: REMOVAL OF ARTERIOVENOUS GORETEX GRAFT ([place]) Evacuation of Lymphocele, Vein Patch angioplasty of brachial artery.;  Surgeon: Angelia Mould, MD;  Location: [facility];  Service: Vascular;  Laterality: Left;   Family History  Problem Relation Age of Onset  . Cancer Mother    Social History  Substance Use Topics  . Smoking status: Never Smoker   . Smokeless tobacco: Never Used  . Alcohol Use: No     Comment: occ. use     Review of Systems  Unable to perform ROS: Mental status change  Allergies  Review of patient's allergies indicates no known allergies.  Home Medications   Prior to Admission medications   Medication Sig Start Date End Date Taking? Authorizing Provider  carvedilol (COREG) 6.25 MG tablet Take 1 tablet (6.25 mg total) by mouth 2 (two) times daily with a meal. 03/08/15   [name], MD  ferrous sulfate 325 (65 FE) MG tablet Take 325 mg by mouth daily with breakfast.    Historical Provider, MD  HYDROcodone-acetaminophen (NORCO/VICODIN) 5-325 MG tablet Take 1 tablet by mouth every 6  (six) hours as needed for moderate pain. 10/11/15   Alvia Grove, PA-C  isosorbide-hydrALAZINE (BIDIL) 20-37.5 MG per tablet Take 1 tablet by mouth 3 (three) times daily. Patient taking differently: Take 1 tablet by mouth 3 (three) times daily. 8am,2pm,8pm 03/08/15   [name], MD  levothyroxine (SYNTHROID, LEVOTHROID) 75 MCG tablet Take 75 mcg by mouth daily before breakfast.     Historical Provider, MD  linagliptin (TRADJENTA) 5 MG TABS tablet Take 5 mg by mouth daily.    Historical Provider, MD  niacin (NIASPAN) 1000 MG CR tablet Take 1,000 mg by mouth at bedtime.    Historical Provider, MD  omeprazole (PRILOSEC) 20 MG capsule Take 40 mg by mouth daily.    Historical Provider, MD  simvastatin (ZOCOR) 20 MG tablet Take 20 mg by mouth at bedtime.  02/28/15   Historical Provider, MD  sodium polystyrene (KAYEXALATE) 15 GM/60ML suspension Take 30 g by mouth. Daily on Monday and Friday    Historical Provider, MD  tamsulosin (FLOMAX) 0.4 MG CAPS capsule Take 0.4 mg by mouth daily.    Historical Provider, MD  torsemide (DEMADEX) 20 MG tablet Take 20 mg by mouth daily. Daily at 8am 06/13/15   Historical Provider, MD  Vitamin D, Ergocalciferol, (DRISDOL) 50000 UNITS CAPS capsule Take 1 capsule (50,000 Units total) by mouth every 7 (seven) days. Patient taking differently: Take 50,000 Units by mouth every 7 (seven) days. On Monday 03/08/15   [name], MD   Triage Vitals: BP 149/95 mmHg  Pulse 106  Temp(Src) 98.2 F (36.8 C) (Oral)  Resp 18  Wt 194 lb (87.998 kg)  SpO2 100% Physical Exam  Constitutional: He is oriented to person, place, and time. He appears well-developed.  HENT:  Head: Normocephalic.  Eyes: Conjunctivae are normal.  Neck: No tracheal deviation present.  Cardiovascular:  No murmur heard. Musculoskeletal: Normal range of motion.  Neurological: He is oriented to person, place, and time.  Skin: Skin is warm.  2 incisions on left antecubital area, one is healing well,  other incision opened 0.5 cm and draining.   Psychiatric: He has a normal mood and affect.    ED Course  Procedures (including critical care time) DIAGNOSTIC STUDIES: Oxygen Saturation is 100% on ra, nl by my interpretation.    MDM   Final diagnoses:  Wound disruption, initial encounter    Patient recently had his dialysis graft removed in his left forearm.  This was done on December 13.  Patient has 2 incisions once healing nicely the other one is opening up a small amount. One is infected.  Patient is to see the vascular surgeon in 3 or 4 days.  He will continue with dressing changes daily The chart was scribed for me under my direct supervision.  I personally performed the history, physical, and medical decision making and all procedures in the evaluation of this patient.Milton Ferguson, MD 10/15/15 1245  Milton Ferguson, MD 10/15/15  1246 

## 2015-10-15 NOTE — ED Notes (Signed)
Pt awaiting ride to [facility].

## 2015-10-15 NOTE — Discharge Instructions (Signed)
Clean wound twice a day with soap and water.  Dress wound twice a day and follow up with dr. Scot Dock this week as planned

## 2015-10-17 ENCOUNTER — Ambulatory Visit (HOSPITAL_COMMUNITY)
Admission: RE | Admit: 2015-10-17 | Discharge: 2015-10-17 | Disposition: A | Discharge status: Home / Self Care | Payer: Medicare Other | Source: Ambulatory Visit | Attending: Vascular Surgery | Admitting: Vascular Surgery

## 2015-10-17 ENCOUNTER — Other Ambulatory Visit: Payer: Self-pay | Admitting: Vascular Surgery

## 2015-10-17 ENCOUNTER — Ambulatory Visit (INDEPENDENT_AMBULATORY_CARE_PROVIDER_SITE_OTHER): Payer: Medicare Other | Admitting: Vascular Surgery

## 2015-10-17 ENCOUNTER — Encounter: Payer: Self-pay | Admitting: Vascular Surgery

## 2015-10-17 ENCOUNTER — Other Ambulatory Visit: Payer: Self-pay | Admitting: *Deleted

## 2015-10-17 VITALS — BP 148/81 | HR 92 | Temp 98.1°F | Resp 16 | Ht 68.0 in | Wt 188.0 lb

## 2015-10-17 DIAGNOSIS — M7989 Other specified soft tissue disorders: Principal | ICD-10-CM

## 2015-10-17 DIAGNOSIS — M79661 Pain in right lower leg: Secondary | ICD-10-CM

## 2015-10-17 DIAGNOSIS — R600 Localized edema: Secondary | ICD-10-CM

## 2015-10-17 DIAGNOSIS — N186 End stage renal disease: Secondary | ICD-10-CM

## 2015-10-17 NOTE — Progress Notes (Signed)
Filed Vitals:   10/17/15 1144 10/17/15 1151  BP: 151/82 148/81  Pulse: 90 92  Temp: 98.1 F (36.7 C)   TempSrc: Oral   Resp: 16   Height: 5\' 8"  (1.727 m)   Weight: 188 lb (85.276 kg)   SpO2: 100%

## 2015-10-17 NOTE — Progress Notes (Signed)
Patient name: Zachary [name] MRN: [id number] DOB: 02-01-1946 Sex: male  REASON FOR VISIT: Follow up after removal of left upper arm graft.  HPI: GWYNN BERNSTEIN is a 69 y.o. male who is not on dialysis. 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 Comes in today for a wound check.  He has been limping some in his right leg is swollen. He states that it is not painful.  Current Outpatient Prescriptions  Medication Sig Dispense Refill  . carvedilol (COREG) 6.25 MG tablet Take 1 tablet (6.25 mg total) by mouth 2 (two) times daily with a meal. 60 tablet 3  . ferrous sulfate 325 (65 FE) MG tablet Take 325 mg by mouth daily with breakfast.    . HYDROcodone-acetaminophen (NORCO/VICODIN) 5-325 MG tablet Take 1 tablet by mouth every 6 (six) hours as needed for moderate pain. 30 tablet 0  . isosorbide-hydrALAZINE (BIDIL) 20-37.5 MG per tablet Take 1 tablet by mouth 3 (three) times daily. (Patient taking differently: Take 1 tablet by mouth 3 (three) times daily. 8am,2pm,8pm) 90 tablet 3  . levothyroxine (SYNTHROID, LEVOTHROID) 75 MCG tablet Take 75 mcg by mouth daily before breakfast.     . linagliptin (TRADJENTA) 5 MG TABS tablet Take 5 mg by mouth daily.    . niacin (NIASPAN) 1000 MG CR tablet Take 1,000 mg by mouth at bedtime.    Marland Kitchen omeprazole (PRILOSEC) 20 MG capsule Take 40 mg by mouth daily.    . simvastatin (ZOCOR) 20 MG tablet Take 20 mg by mouth at bedtime.     . sodium polystyrene (KAYEXALATE) 15 GM/60ML suspension Take 30 g by mouth. Daily on Monday and Friday    . tamsulosin (FLOMAX) 0.4 MG CAPS capsule Take 0.4 mg by mouth daily.    . Vitamin D, Ergocalciferol, (DRISDOL) 50000 UNITS CAPS capsule Take 1 capsule (50,000 Units total) by mouth every 7 (seven) days. (Patient taking differently: Take 50,000 Units by mouth every 7 (seven) days. On Monday) 30 capsule 3  .  torsemide (DEMADEX) 20 MG tablet Take 20 mg by mouth daily. Reported on 10/17/2015     No current facility-administered medications for this visit.    REVIEW OF SYSTEMS:  [X]  denotes positive finding, [ ]  denotes negative finding Cardiac  Comments:  Chest pain or chest pressure:    Shortness of breath upon exertion:    Short of breath when lying flat:    Irregular heart rhythm:    Constitutional    Fever or chills:      PHYSICAL EXAM: Filed Vitals:   10/17/15 1144 10/17/15 1151  BP: 151/82 148/81  Pulse: 90 92  Temp: 98.1 F (36.7 C)   TempSrc: Oral   Resp: 16   Height: 5\' 8"  (1.727 m)   Weight: 188 lb (85.276 kg)   SpO2: 100%     GENERAL: The patient is a well-nourished male, in no acute distress. The vital signs are documented above. CARDIOVASCULAR: There is a regular rate and rhythm. PULMONARY: There is good air exchange bilaterally without wheezing or rales. His incisions are healing adequately. There is a small open area at the antecubital incision. I removed his sutures in the office today. We'll have to continue dressing changes at the antecubital level. Has significant right lower extremity swelling and I have ordered a venous duplex scan.  RIGHT LOWER EXTREMITY VENOUS DUPLEX: I  have independently interpreted his venous duplex scan which shows no evidence of DVT in the right lower extremity.  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 He will continue dressing changes and I'll plan on seeing him back in 2 weeks.  HYPERTENSION: [name] initial blood pressure today was elevated. We repeated this and this was still elevated. We have encouraged the patient to follow up with their primary care physician for management of their blood pressure.   [name] [facility] [place]: [phone]

## 2015-10-23 ENCOUNTER — Encounter: Payer: Self-pay | Admitting: Vascular Surgery

## 2015-10-31 ENCOUNTER — Encounter: Payer: Self-pay | Admitting: Vascular Surgery

## 2015-10-31 ENCOUNTER — Ambulatory Visit (INDEPENDENT_AMBULATORY_CARE_PROVIDER_SITE_OTHER): Payer: Medicare Other | Admitting: Vascular Surgery

## 2015-10-31 VITALS — BP 160/92 | HR 89 | Temp 97.8°F | Resp 18 | Ht 68.0 in | Wt 200.0 lb

## 2015-10-31 DIAGNOSIS — N184 Chronic kidney disease, stage 4 (severe): Secondary | ICD-10-CM

## 2015-10-31 NOTE — Progress Notes (Signed)
Filed Vitals:   10/31/15 1138 10/31/15 1147  BP: 153/91 160/92  Pulse: 90 89  Temp: 97.8 F (36.6 C)   TempSrc: Oral   Resp: 18   Height: 5\' 8"  (1.727 m)   Weight: 200 lb (90.719 kg)   SpO2: 100%

## 2015-10-31 NOTE — Progress Notes (Signed)
Patient name: Zachary [name] MRN: [id number] DOB: 24-Nov-1945 Sex: male  REASON FOR VISIT: Follow up after removal of left upper arm graft.  HPI: Zachary [name] is a 70 y.o. male who is not on dialysis. 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 There was a small open area at the antecubital incision and I removed his sutures in the office at the last visit. He comes in for a 2 week follow up visit.  He has no specific complaints.  Current Outpatient Prescriptions  Medication Sig Dispense Refill  . carvedilol (COREG) 6.25 MG tablet Take 1 tablet (6.25 mg total) by mouth 2 (two) times daily with a meal. 60 tablet 3  . ferrous sulfate 325 (65 FE) MG tablet Take 325 mg by mouth daily with breakfast.    . levothyroxine (SYNTHROID, LEVOTHROID) 75 MCG tablet Take 75 mcg by mouth daily before breakfast.     . linagliptin (TRADJENTA) 5 MG TABS tablet Take 5 mg by mouth daily.    . niacin (NIASPAN) 1000 MG CR tablet Take 1,000 mg by mouth at bedtime.    Marland Kitchen omeprazole (PRILOSEC) 20 MG capsule Take 40 mg by mouth daily.    . simvastatin (ZOCOR) 20 MG tablet Take 20 mg by mouth at bedtime.     . tamsulosin (FLOMAX) 0.4 MG CAPS capsule Take 0.4 mg by mouth daily.    Marland Kitchen torsemide (DEMADEX) 20 MG tablet Take 20 mg by mouth daily. Reported on 10/17/2015    . Vitamin D, Ergocalciferol, (DRISDOL) 50000 UNITS CAPS capsule Take 1 capsule (50,000 Units total) by mouth every 7 (seven) days. (Patient taking differently: Take 50,000 Units by mouth every 7 (seven) days. On Monday) 30 capsule 3  . HYDROcodone-acetaminophen (NORCO/VICODIN) 5-325 MG tablet Take 1 tablet by mouth every 6 (six) hours as needed for moderate pain. (Patient not taking: Reported on 10/31/2015) 30 tablet 0  . isosorbide-hydrALAZINE (BIDIL) 20-37.5 MG per tablet Take 1 tablet by mouth 3 (three) times daily. (Patient not  taking: Reported on 10/31/2015) 90 tablet 3  . sodium polystyrene (KAYEXALATE) 15 GM/60ML suspension Take 30 g by mouth. Reported on 10/31/2015     No current facility-administered medications for this visit.    REVIEW OF SYSTEMS:  [X]  denotes positive finding, [ ]  denotes negative finding Cardiac  Comments:  Chest pain or chest pressure:    Shortness of breath upon exertion:    Short of breath when lying flat:    Irregular heart rhythm:    Constitutional    Fever or chills:      PHYSICAL EXAM: Filed Vitals:   10/31/15 1138 10/31/15 1147  BP: 153/91 160/92  Pulse: 90 89  Temp: 97.8 F (36.6 C)   TempSrc: Oral   Resp: 18   Height: 5\' 8"  (1.727 m)   Weight: 200 lb (90.719 kg)   SpO2: 100%     GENERAL: The patient is a well-nourished male, in no acute distress. The vital signs are documented above. CARDIOVASCULAR: There is a regular rate and rhythm. PULMONARY: There is good air exchange bilaterally without wheezing or rales. The swelling in his left arm is much improved. The antecubital incision is almost completely healed.  MEDICAL ISSUES: The patient is doing well status post removal of left upper arm graft. I will see him back as needed.  HYPERTENSION: [name] initial blood  pressure today was elevated. We repeated this and this was still elevated. We have encouraged the patient to follow up with their primary care physician for management of their blood pressure.   [name] [facility] [place]: [phone]

## 2015-11-13 DIAGNOSIS — M79675 Pain in left toe(s): Secondary | ICD-10-CM | POA: Diagnosis not present

## 2015-11-13 DIAGNOSIS — B351 Tinea unguium: Secondary | ICD-10-CM | POA: Diagnosis not present

## 2015-12-13 DIAGNOSIS — F79 Unspecified intellectual disabilities: Secondary | ICD-10-CM | POA: Diagnosis not present

## 2015-12-13 DIAGNOSIS — E1165 Type 2 diabetes mellitus with hyperglycemia: Secondary | ICD-10-CM | POA: Diagnosis not present

## 2015-12-13 DIAGNOSIS — N184 Chronic kidney disease, stage 4 (severe): Secondary | ICD-10-CM | POA: Diagnosis not present

## 2015-12-13 DIAGNOSIS — E039 Hypothyroidism, unspecified: Secondary | ICD-10-CM | POA: Diagnosis not present

## 2015-12-20 DIAGNOSIS — E559 Vitamin D deficiency, unspecified: Secondary | ICD-10-CM | POA: Diagnosis not present

## 2015-12-20 DIAGNOSIS — Z79899 Other long term (current) drug therapy: Secondary | ICD-10-CM | POA: Diagnosis not present

## 2015-12-20 DIAGNOSIS — I10 Essential (primary) hypertension: Secondary | ICD-10-CM | POA: Diagnosis not present

## 2015-12-20 DIAGNOSIS — D649 Anemia, unspecified: Secondary | ICD-10-CM | POA: Diagnosis not present

## 2015-12-20 DIAGNOSIS — N179 Acute kidney failure, unspecified: Secondary | ICD-10-CM | POA: Diagnosis not present

## 2015-12-25 DIAGNOSIS — E875 Hyperkalemia: Secondary | ICD-10-CM | POA: Diagnosis not present

## 2015-12-26 DIAGNOSIS — D649 Anemia, unspecified: Secondary | ICD-10-CM | POA: Diagnosis not present

## 2015-12-26 DIAGNOSIS — N184 Chronic kidney disease, stage 4 (severe): Secondary | ICD-10-CM | POA: Diagnosis not present

## 2015-12-26 DIAGNOSIS — E875 Hyperkalemia: Secondary | ICD-10-CM | POA: Diagnosis not present

## 2016-02-06 ENCOUNTER — Encounter: Payer: Self-pay | Admitting: Vascular Surgery

## 2016-02-13 ENCOUNTER — Ambulatory Visit (INDEPENDENT_AMBULATORY_CARE_PROVIDER_SITE_OTHER): Payer: Medicare Other | Admitting: Vascular Surgery

## 2016-02-13 ENCOUNTER — Encounter: Payer: Self-pay | Admitting: Vascular Surgery

## 2016-02-13 ENCOUNTER — Other Ambulatory Visit: Payer: Self-pay

## 2016-02-13 VITALS — BP 164/99 | HR 86 | Temp 97.8°F | Resp 18 | Ht 68.5 in | Wt 200.0 lb

## 2016-02-13 DIAGNOSIS — N184 Chronic kidney disease, stage 4 (severe): Secondary | ICD-10-CM | POA: Diagnosis not present

## 2016-02-13 NOTE — Progress Notes (Signed)
Filed Vitals:   02/13/16 1053 02/13/16 1057  BP: 151/91 164/99  Pulse: 86   Temp: 97.8 F (36.6 C)   TempSrc: Oral   Resp: 18   Height: 5' 8.5" (1.74 m)   Weight: 200 lb (90.719 kg)   SpO2: 100%

## 2016-02-13 NOTE — Progress Notes (Signed)
[facility]  Patient name: Zachary [name] MRN: [id number] DOB: 1946/04/02 Sex: male  REASON FOR VISIT: To evaluate for new hemodialysis access. Referred by Dr. Lowanda Foster.  HPI: Zachary [name] is a 70 y.o. male who is not yet on dialysis. I had to remove an infected left upper arm graft and evacuate a large lymphocele on 10/09/2015. He is now sent back for evaluation for new access. He denies any recent uremic symptoms. Specifically he denies nausea, vomiting, fatigue, or anorexia.  I have reviewed the records from the nephrology office. 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 He also has a history of hyperkalemia and requires Kayexalate. In addition he has anemia of chronic disease.  Past Medical History  Diagnosis Date  . Hypertension   . Diabetes mellitus   . Smoking   . Abnormal CT scan, kidney 10/06/2011  . UTI (lower urinary tract infection) 10/06/2011  . Bladder wall thickening 10/06/2011  . 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 Poor historian poor historian  . BPH (benign prostatic hypertrophy)   . Anxiety     mental retardation  . Hypothyroidism   . MR (mental retardation)   . Acute pyelonephritis 10/07/2011  . Hyperkalemia   . Renal insufficiency     chronic history  . Renal failure (ARF), acute on chronic (HCC)   . Anemia     normocytic  . Edema      history of lower extremity edema  . Hyperlipidemia   . Impaired speech     Family History  Problem Relation Age of Onset  . Cancer Mother     SOCIAL HISTORY: Social History  Substance Use Topics  . Smoking status: Never Smoker   . Smokeless tobacco: Never Used  . Alcohol Use: No     Comment: occ. use     No Known Allergies  Current Outpatient Prescriptions  Medication Sig Dispense Refill  . carvedilol (COREG) 6.25 MG tablet Take 1 tablet (6.25 mg total) by mouth 2 (two)  times daily with a meal. 60 tablet 3  . ferrous sulfate 325 (65 FE) MG tablet Take 325 mg by mouth daily with breakfast.    . isosorbide-hydrALAZINE (BIDIL) 20-37.5 MG per tablet Take 1 tablet by mouth 3 (three) times daily. 90 tablet 3  . levothyroxine (SYNTHROID, LEVOTHROID) 75 MCG tablet Take 75 mcg by mouth daily before breakfast.     . linagliptin (TRADJENTA) 5 MG TABS tablet Take 5 mg by mouth daily.    . niacin (NIASPAN) 1000 MG CR tablet Take 1,000 mg by mouth at bedtime.    Marland Kitchen omeprazole (PRILOSEC) 20 MG capsule Take 40 mg by mouth daily.    . simvastatin (ZOCOR) 20 MG tablet Take 20 mg by mouth at bedtime.     . sodium polystyrene (KAYEXALATE) 15 GM/60ML suspension Take 30 g by mouth. Drinks entire content of 1 bottle (30 gm) once daily on Monday/Wednesday/Friday    . tamsulosin (FLOMAX) 0.4 MG CAPS capsule Take 0.4 mg by mouth daily.    Marland Kitchen torsemide (DEMADEX) 20 MG tablet Take 20 mg by mouth daily. Takes two 20 mg tablets (40 Mg) daily.    . Vitamin D, Ergocalciferol, (DRISDOL) 50000 UNITS CAPS capsule Take 1 capsule (50,000 Units total) by mouth every 7 (seven) days. (Patient taking differently: Take 50,000 Units by mouth every 7 (seven) days. On Monday) 30 capsule 3  .  HYDROcodone-acetaminophen (NORCO/VICODIN) 5-325 MG tablet Take 1 tablet by mouth every 6 (six) hours as needed for moderate pain. (Patient not taking: Reported on 10/31/2015) 30 tablet 0   No current facility-administered medications for this visit.    REVIEW OF SYSTEMS:  [X]  denotes positive finding, [ ]  denotes negative finding Cardiac  Comments:  Chest pain or chest pressure:    Shortness of breath upon exertion:    Short of breath when lying flat:    Irregular heart rhythm:        Vascular    Pain in calf, thigh, or hip brought on by ambulation:    Pain in feet at night that wakes you up from your sleep:     Blood clot in your veins:    Leg swelling:  X       Pulmonary    Oxygen at home:    Productive  cough:     Wheezing:         Neurologic    Sudden weakness in arms or legs:     Sudden numbness in arms or legs:     Sudden onset of difficulty speaking or slurred speech:    Temporary loss of vision in one eye:     Problems with dizziness:         Gastrointestinal    Blood in stool:     Vomited blood:         Genitourinary    Burning when urinating:     Blood in urine:        Psychiatric    Major depression:         Hematologic    Bleeding problems:    Problems with blood clotting too easily:        Skin    Rashes or ulcers:        Constitutional    Fever or chills:      PHYSICAL EXAM: Filed Vitals:   02/13/16 1053 02/13/16 1057  BP: 151/91 164/99  Pulse: 86   Temp: 97.8 F (36.6 C)   TempSrc: Oral   Resp: 18   Height: 5' 8.5" (1.74 m)   Weight: 200 lb (90.719 kg)   SpO2: 100%     GENERAL: The patient is a well-nourished male, in no acute distress. The vital signs are documented above. CARDIAC: There is a regular rate and rhythm.  VASCULAR: he has palpable brachial and radial pulses bilaterally. PULMONARY: There is good air exchange bilaterally without wheezing or rales. ABDOMEN: Soft and non-tender with normal pitched bowel sounds.  MUSCULOSKELETAL: There are no major deformities or cyanosis. NEUROLOGIC: No focal weakness or paresthesias are detected. SKIN: There are no ulcers or rashes noted. PSYCHIATRIC: The patient has a normal affect.  DATA:   VEIN MAP: His vein map on 03/28/2015, shows that his forearm and upper arm cephalic vein do not appear to be adequate on the right. His basilic vein might be usable.  MEDICAL ISSUES:  STAGE IV CHRONIC KIDNEY DISEASE: I have recommended placement of an AV fistula or AV graft in the left arm. I have explained the indications for placement of an AV fistula or AV graft. I've explained that if at all possible we will place an AV fistula.  I have reviewed the risks of placement of an AV fistula including but not  limited to: failure of the fistula to mature, need for subsequent interventions, and thrombosis. In addition I have reviewed the potential complications of placement of an AV  graft. These risks include, but are not limited to, graft thrombosis, graft infection, wound healing problems, bleeding, arm swelling, and steal syndrome. All the patient's questions were answered and they are agreeable to proceed with surgery. I have discussed this today with his court appointed guardian. His surgery is scheduled for 02/26/16.  [name] [facility] [place]: [phone]

## 2016-02-21 DIAGNOSIS — H25813 Combined forms of age-related cataract, bilateral: Secondary | ICD-10-CM | POA: Diagnosis not present

## 2016-02-26 ENCOUNTER — Encounter (HOSPITAL_COMMUNITY): Payer: Self-pay | Admitting: *Deleted

## 2016-02-26 ENCOUNTER — Other Ambulatory Visit: Payer: Self-pay | Admitting: *Deleted

## 2016-02-26 ENCOUNTER — Ambulatory Visit (HOSPITAL_COMMUNITY): Payer: Medicare Other | Admitting: Certified Registered Nurse Anesthetist

## 2016-02-26 ENCOUNTER — Ambulatory Visit (HOSPITAL_COMMUNITY)
Admission: RE | Admit: 2016-02-26 | Discharge: 2016-02-26 | Disposition: A | Discharge status: Home / Self Care | Payer: Medicare Other | Source: Ambulatory Visit | Attending: Vascular Surgery | Admitting: Vascular Surgery

## 2016-02-26 ENCOUNTER — Encounter (HOSPITAL_COMMUNITY)
Admission: RE | Disposition: A | Discharge status: Home / Self Care | Payer: Self-pay | Source: Ambulatory Visit | Attending: Vascular Surgery

## 2016-02-26 DIAGNOSIS — N184 Chronic kidney disease, stage 4 (severe): Secondary | ICD-10-CM | POA: Diagnosis not present

## 2016-02-26 DIAGNOSIS — N186 End stage renal disease: Secondary | ICD-10-CM

## 2016-02-26 DIAGNOSIS — I129 Hypertensive chronic kidney disease with stage 1 through stage 4 chronic kidney disease, or unspecified chronic kidney disease: Secondary | ICD-10-CM | POA: Diagnosis not present

## 2016-02-26 DIAGNOSIS — Z7984 Long term (current) use of oral hypoglycemic drugs: Secondary | ICD-10-CM | POA: Diagnosis not present

## 2016-02-26 DIAGNOSIS — E785 Hyperlipidemia, unspecified: Secondary | ICD-10-CM | POA: Insufficient documentation

## 2016-02-26 DIAGNOSIS — E875 Hyperkalemia: Secondary | ICD-10-CM | POA: Diagnosis not present

## 2016-02-26 DIAGNOSIS — E1122 Type 2 diabetes mellitus with diabetic chronic kidney disease: Secondary | ICD-10-CM | POA: Insufficient documentation

## 2016-02-26 DIAGNOSIS — Z4931 Encounter for adequacy testing for hemodialysis: Secondary | ICD-10-CM

## 2016-02-26 DIAGNOSIS — F79 Unspecified intellectual disabilities: Secondary | ICD-10-CM | POA: Diagnosis not present

## 2016-02-26 DIAGNOSIS — Z79899 Other long term (current) drug therapy: Secondary | ICD-10-CM | POA: Insufficient documentation

## 2016-02-26 DIAGNOSIS — N400 Enlarged prostate without lower urinary tract symptoms: Secondary | ICD-10-CM | POA: Diagnosis not present

## 2016-02-26 DIAGNOSIS — E039 Hypothyroidism, unspecified: Secondary | ICD-10-CM | POA: Diagnosis not present

## 2016-02-26 DIAGNOSIS — D638 Anemia in other chronic diseases classified elsewhere: Secondary | ICD-10-CM | POA: Insufficient documentation

## 2016-02-26 HISTORY — PX: BASCILIC VEIN TRANSPOSITION: SHX5742

## 2016-02-26 HISTORY — PX: AV FISTULA PLACEMENT: SHX1204

## 2016-02-26 LAB — GLUCOSE, CAPILLARY
Glucose-Capillary: 105 mg/dL — ABNORMAL HIGH (ref 65–99)
Glucose-Capillary: 102 mg/dL — ABNORMAL HIGH (ref 65–99)

## 2016-02-26 LAB — POCT I-STAT 4, (NA,K, GLUC, HGB,HCT)
Glucose, Bld: 106 mg/dL — ABNORMAL HIGH (ref 65–99)
HCT: 28 % — ABNORMAL LOW (ref 39.0–52.0)
Hemoglobin: 9.5 g/dL — ABNORMAL LOW (ref 13.0–17.0)
Potassium: 5 mmol/L (ref 3.5–5.1)
Sodium: 143 mmol/L (ref 135–145)

## 2016-02-26 SURGERY — ARTERIOVENOUS (AV) FISTULA CREATION
Anesthesia: General | Site: Arm Upper | Laterality: Right

## 2016-02-26 MED ORDER — PROPOFOL 10 MG/ML IV BOLUS
INTRAVENOUS | Status: DC | PRN
  Administered 2016-02-26: 150 mg via INTRAVENOUS

## 2016-02-26 MED ORDER — ROCURONIUM BROMIDE 50 MG/5ML IV SOLN
INTRAVENOUS | Status: AC
  Filled 2016-02-26: qty 1

## 2016-02-26 MED ORDER — FENTANYL CITRATE (PF) 100 MCG/2ML IJ SOLN: 25.0000 ug | INTRAMUSCULAR | Status: DC | PRN

## 2016-02-26 MED ORDER — LIDOCAINE HCL (CARDIAC) 20 MG/ML IV SOLN
INTRAVENOUS | Status: DC | PRN
  Administered 2016-02-26: 100 mg via INTRAVENOUS

## 2016-02-26 MED ORDER — DEXTROSE 5 % IV SOLN
INTRAVENOUS | Status: AC
  Filled 2016-02-26: qty 1.5

## 2016-02-26 MED ORDER — SODIUM CHLORIDE 0.9 % IV SOLN
INTRAVENOUS | Status: DC | PRN
  Administered 2016-02-26: 500 mL

## 2016-02-26 MED ORDER — SODIUM CHLORIDE 0.9 % IV SOLN
INTRAVENOUS | Status: DC
  Administered 2016-02-26 (×2): via INTRAVENOUS

## 2016-02-26 MED ORDER — ONDANSETRON HCL 4 MG/2ML IJ SOLN
INTRAMUSCULAR | Status: AC
  Filled 2016-02-26: qty 2

## 2016-02-26 MED ORDER — ONDANSETRON HCL 4 MG/2ML IJ SOLN
INTRAMUSCULAR | Status: DC | PRN
  Administered 2016-02-26: 4 mg via INTRAVENOUS

## 2016-02-26 MED ORDER — CHLORHEXIDINE GLUCONATE CLOTH 2 % EX PADS: 6.0000 | MEDICATED_PAD | Freq: Once | CUTANEOUS | Status: DC

## 2016-02-26 MED ORDER — LIDOCAINE 2% (20 MG/ML) 5 ML SYRINGE
INTRAMUSCULAR | Status: AC
Start: 2016-02-26 — End: 2016-02-26
  Filled 2016-02-26: qty 5

## 2016-02-26 MED ORDER — PROTAMINE SULFATE 10 MG/ML IV SOLN
INTRAVENOUS | Status: DC | PRN
  Administered 2016-02-26 (×5): 10 mg via INTRAVENOUS

## 2016-02-26 MED ORDER — PROPOFOL 10 MG/ML IV BOLUS
INTRAVENOUS | Status: AC
  Filled 2016-02-26: qty 20

## 2016-02-26 MED ORDER — PHENYLEPHRINE HCL 10 MG/ML IJ SOLN
INTRAMUSCULAR | Status: DC | PRN
  Administered 2016-02-26 (×4): 80 ug via INTRAVENOUS

## 2016-02-26 MED ORDER — FENTANYL CITRATE (PF) 250 MCG/5ML IJ SOLN
INTRAMUSCULAR | Status: AC
  Filled 2016-02-26: qty 5

## 2016-02-26 MED ORDER — DEXTROSE 5 % IV SOLN
1.5000 g | INTRAVENOUS | Status: AC
  Administered 2016-02-26: 1.5 g via INTRAVENOUS

## 2016-02-26 MED ORDER — 0.9 % SODIUM CHLORIDE (POUR BTL) OPTIME
TOPICAL | Status: DC | PRN
  Administered 2016-02-26: 1000 mL

## 2016-02-26 MED ORDER — FENTANYL CITRATE (PF) 100 MCG/2ML IJ SOLN
INTRAMUSCULAR | Status: DC | PRN
  Administered 2016-02-26: 50 ug via INTRAVENOUS
  Administered 2016-02-26: 25 ug via INTRAVENOUS
  Administered 2016-02-26: 50 ug via INTRAVENOUS
  Administered 2016-02-26: 25 ug via INTRAVENOUS

## 2016-02-26 MED ORDER — OXYCODONE-ACETAMINOPHEN 5-325 MG PO TABS: 1.0000 | ORAL_TABLET | Freq: Four times a day (QID) | ORAL | Status: DC | PRN

## 2016-02-26 MED ORDER — HEPARIN SODIUM (PORCINE) 1000 UNIT/ML IJ SOLN
INTRAMUSCULAR | Status: DC | PRN
  Administered 2016-02-26: 7000 [IU] via INTRAVENOUS

## 2016-02-26 SURGICAL SUPPLY — 35 items
ARMBAND PINK RESTRICT EXTREMIT (MISCELLANEOUS) ×2 IMPLANT
CANISTER SUCTION 2500CC (MISCELLANEOUS) ×2 IMPLANT
CANNULA VESSEL 3MM 2 BLNT TIP (CANNULA) ×2 IMPLANT
CLIP TI MEDIUM 6 (CLIP) ×2 IMPLANT
CLIP TI WIDE RED SMALL 6 (CLIP) ×6 IMPLANT
COVER PROBE W GEL 5X96 (DRAPES) IMPLANT
DECANTER SPIKE VIAL GLASS SM (MISCELLANEOUS) ×2 IMPLANT
ELECT REM PT RETURN 9FT ADLT (ELECTROSURGICAL) ×2
ELECTRODE REM PT RTRN 9FT ADLT (ELECTROSURGICAL) ×1 IMPLANT
GLOVE BIO SURGEON STRL SZ7.5 (GLOVE) ×2 IMPLANT
GLOVE BIOGEL PI IND STRL 6.5 (GLOVE) ×1 IMPLANT
GLOVE BIOGEL PI IND STRL 7.0 (GLOVE) ×2 IMPLANT
GLOVE BIOGEL PI IND STRL 8 (GLOVE) ×1 IMPLANT
GLOVE BIOGEL PI INDICATOR 6.5 (GLOVE) ×1
GLOVE BIOGEL PI INDICATOR 7.0 (GLOVE) ×2
GLOVE BIOGEL PI INDICATOR 8 (GLOVE) ×1
GLOVE SURG SS PI 6.5 STRL IVOR (GLOVE) ×4 IMPLANT
GOWN STRL REUS W/ TWL LRG LVL3 (GOWN DISPOSABLE) ×3 IMPLANT
GOWN STRL REUS W/TWL LRG LVL3 (GOWN DISPOSABLE) ×6
KIT BASIN OR (CUSTOM PROCEDURE TRAY) ×2 IMPLANT
KIT ROOM TURNOVER OR (KITS) ×2 IMPLANT
LIQUID BAND (GAUZE/BANDAGES/DRESSINGS) ×2 IMPLANT
NS IRRIG 1000ML POUR BTL (IV SOLUTION) ×2 IMPLANT
PACK CV ACCESS (CUSTOM PROCEDURE TRAY) ×2 IMPLANT
PAD ARMBOARD 7.5X6 YLW CONV (MISCELLANEOUS) ×4 IMPLANT
SPONGE SURGIFOAM ABS GEL 100 (HEMOSTASIS) IMPLANT
SUT PROLENE 6 0 BV (SUTURE) ×6 IMPLANT
SUT SILK 2 0 SH (SUTURE) ×2 IMPLANT
SUT SILK 3 0 (SUTURE) ×2
SUT SILK 3-0 18XBRD TIE 12 (SUTURE) ×1 IMPLANT
SUT VIC AB 3-0 SH 27 (SUTURE) ×4
SUT VIC AB 3-0 SH 27X BRD (SUTURE) ×2 IMPLANT
SUT VICRYL 4-0 PS2 18IN ABS (SUTURE) ×4 IMPLANT
UNDERPAD 30X30 INCONTINENT (UNDERPADS AND DIAPERS) ×2 IMPLANT
WATER STERILE IRR 1000ML POUR (IV SOLUTION) ×2 IMPLANT

## 2016-02-26 NOTE — Discharge Instructions (Signed)
° ° °  02/26/2016 Zachary [name] [id number] [birth year]  Surgeon(s): Angelia Mould, MD  Procedure(s): ARTERIOVENOUS (AV) FISTULA CREATION  Right BASCILIC VEIN TRANSPOSITION  x Do not stick graft for 12 weeks

## 2016-02-26 NOTE — Anesthesia Procedure Notes (Signed)
Procedure Name: LMA Insertion Date/Time: 02/26/2016 12:46 PM Performed by: Montel Clock Pre-anesthesia Checklist: Patient identified, Emergency Drugs available, Suction available, Patient being monitored and Timeout performed Patient Re-evaluated:Patient Re-evaluated prior to inductionOxygen Delivery Method: Circle system utilized Preoxygenation: Pre-oxygenation with 100% oxygen Intubation Type: IV induction Ventilation: Mask ventilation without difficulty LMA: LMA inserted LMA Size: 5.0 Number of attempts: 1 Dental Injury: Teeth and Oropharynx as per pre-operative assessment

## 2016-02-26 NOTE — Anesthesia Preprocedure Evaluation (Addendum)
Anesthesia Evaluation  Patient identified by MRN, date of birth, ID band Patient awake    Reviewed: Allergy & Precautions, NPO status , Patient's Chart, lab work & pertinent test results  Airway Mallampati: II  TM Distance: >3 FB Neck ROM: Full    Dental  (+) Poor Dentition, Dental Advisory Given   Pulmonary neg pulmonary ROS,    breath sounds clear to auscultation       Cardiovascular hypertension,  Rhythm:Regular Rate:Normal     Neuro/Psych Mental retardation. Lives in a long-term care facility.    GI/Hepatic negative GI ROS, Neg liver ROS,   Endo/Other  diabetesHypothyroidism   Renal/GU Renal diseasenegative Renal ROS     Musculoskeletal   Abdominal   Peds  Hematology   Anesthesia Other Findings   Reproductive/Obstetrics                           Anesthesia Physical Anesthesia Plan  ASA: III  Anesthesia Plan: General   Post-op Pain Management:    Induction:   Airway Management Planned: LMA  Additional Equipment:   Intra-op Plan:   Post-operative Plan: Extubation in OR  Informed Consent: I have reviewed the patients History and Physical, chart, labs and discussed the procedure including the risks, benefits and alternatives for the proposed anesthesia with the patient or authorized representative who has indicated his/her understanding and acceptance.   Dental advisory given  Plan Discussed with: CRNA and Anesthesiologist  Anesthesia Plan Comments:         Anesthesia Quick Evaluation

## 2016-02-26 NOTE — Transfer of Care (Signed)
Immediate Anesthesia Transfer of Care Note  Patient: Zachary [name]  Procedure(s) Performed: Procedure(s): ARTERIOVENOUS (AV) FISTULA CREATION  (Right) Right BASCILIC VEIN TRANSPOSITION (Right)  Patient Location: PACU  Anesthesia Type:General  Level of Consciousness: awake, oriented and patient cooperative  Airway & Oxygen Therapy: Patient Spontanous Breathing and Patient connected to nasal cannula oxygen  Post-op Assessment: Report given to RN and Post -op Vital signs reviewed and stable  Post vital signs: Reviewed  Last Vitals:  Filed Vitals:   02/26/16 0945  BP: 168/91  Pulse: 82  Temp: 36.6 C  Resp: 18    Last Pain: There were no vitals filed for this visit.       Complications: No apparent anesthesia complications

## 2016-02-26 NOTE — Op Note (Signed)
    NAME: Zachary [name]   MRN: [id number] DOB: 1946/02/24    DATE OF OPERATION: 02/26/2016  PREOP DIAGNOSIS: Stage IV chronic kidney disease  POSTOP DIAGNOSIS: Same  PROCEDURE: Right basilic vein transposition  SURGEON: Judeth Cornfield. Scot Dock, MD, FACS  ASSIST: Silva Bandy, [facility]  ANESTHESIA: Gen.   EBL: minimal  INDICATIONS: Zachary [name] is a 70 y.o. male who presents for new access.  FINDINGS: 4 mm right upper arm basilic vein  TECHNIQUE: Patient was taken to the operating room and received a general anesthetic. The right upper extremity was draped in usual sterile fashion. A longitudinal incision was made over the basilic vein just above the antecubital level. Here the basilic vein was dissected free. Branches were divided between clips and 3-0 silk ties. Through this same incision I dissected further laterally and beneath the fascia exposed the brachial artery. Using 2 additional incisions along the medial aspect of the right upper arm the basilic vein was harvested up to the axilla. He did have a large branch connecting to the deep system which was divided between 2-0 silk ties. The vein was fully mobilized and ligated distally. It was then marked to prevent twisting. A tunnel was created from the brachial artery to the axillary incision and the patient was heparinized. The vein was brought through the tunnel for anastomosis to the brachial artery. The brachial artery was clamped proximally and distally and longitudinal arteriotomy was made. The vein was spatulated and sewn end-to-side to the artery using continuous 6-0 Prolene suture. The completion was excellent thrill in the fistula and a good radial and ulnar signal with the Doppler. The heparin was partially reversed with protamine. Each incision was closed with 2 deep layers of 3-0 Vicryl and the skin closed with 4-0 Vicryl. Liquiband was applied. Patient tolerated procedure well and was transferred to recovery room in stable condition.  All needle and sponge counts were correct.  [name], MD, FACS [facility]  DATE OF DICTATION:   02/26/2016

## 2016-02-26 NOTE — Interval H&P Note (Signed)
History and Physical Interval Note:  02/26/2016 11:58 AM  Zachary [name]  has presented today for surgery, with the diagnosis of Stage IV Chronic Kidney Disease N18.4  The various methods of treatment have been discussed with the patient and family. After consideration of risks, benefits and other options for treatment, the patient has consented to  Procedure(s): ARTERIOVENOUS (AV) FISTULA CREATION (Right) as a surgical intervention .  [name] history has been reviewed, patient examined, no change in status, stable for surgery.  I have reviewed the patient's chart and labs.  Questions were answered to the patient's satisfaction.     [name]

## 2016-02-26 NOTE — H&P (View-Only) (Signed)
[facility]  Patient name: Zachary [name] MRN: [id number] DOB: 1945-12-24 Sex: male  REASON FOR VISIT: To evaluate for new hemodialysis access. Referred by Dr. Lowanda Foster.  HPI: Zachary [name] is a 70 y.o. male who is not yet on dialysis. I had to remove an infected left upper arm graft and evacuate a large lymphocele on 10/09/2015. He is now sent back for evaluation for new access. He denies any recent uremic symptoms. Specifically he denies nausea, vomiting, fatigue, or anorexia.  I have reviewed the records from the nephrology office. 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 He also has a history of hyperkalemia and requires Kayexalate. In addition he has anemia of chronic disease.  Past Medical History  Diagnosis Date  . Hypertension   . Diabetes mellitus   . Smoking   . Abnormal CT scan, kidney 10/06/2011  . UTI (lower urinary tract infection) 10/06/2011  . Bladder wall thickening 10/06/2011  . 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 Poor historian poor historian  . BPH (benign prostatic hypertrophy)   . Anxiety     mental retardation  . Hypothyroidism   . MR (mental retardation)   . Acute pyelonephritis 10/07/2011  . Hyperkalemia   . Renal insufficiency     chronic history  . Renal failure (ARF), acute on chronic (HCC)   . Anemia     normocytic  . Edema      history of lower extremity edema  . Hyperlipidemia   . Impaired speech     Family History  Problem Relation Age of Onset  . Cancer Mother     SOCIAL HISTORY: Social History  Substance Use Topics  . Smoking status: Never Smoker   . Smokeless tobacco: Never Used  . Alcohol Use: No     Comment: occ. use     No Known Allergies  Current Outpatient Prescriptions  Medication Sig Dispense Refill  . carvedilol (COREG) 6.25 MG tablet Take 1 tablet (6.25 mg total) by mouth 2 (two)  times daily with a meal. 60 tablet 3  . ferrous sulfate 325 (65 FE) MG tablet Take 325 mg by mouth daily with breakfast.    . isosorbide-hydrALAZINE (BIDIL) 20-37.5 MG per tablet Take 1 tablet by mouth 3 (three) times daily. 90 tablet 3  . levothyroxine (SYNTHROID, LEVOTHROID) 75 MCG tablet Take 75 mcg by mouth daily before breakfast.     . linagliptin (TRADJENTA) 5 MG TABS tablet Take 5 mg by mouth daily.    . niacin (NIASPAN) 1000 MG CR tablet Take 1,000 mg by mouth at bedtime.    Marland Kitchen omeprazole (PRILOSEC) 20 MG capsule Take 40 mg by mouth daily.    . simvastatin (ZOCOR) 20 MG tablet Take 20 mg by mouth at bedtime.     . sodium polystyrene (KAYEXALATE) 15 GM/60ML suspension Take 30 g by mouth. Drinks entire content of 1 bottle (30 gm) once daily on Monday/Wednesday/Friday    . tamsulosin (FLOMAX) 0.4 MG CAPS capsule Take 0.4 mg by mouth daily.    Marland Kitchen torsemide (DEMADEX) 20 MG tablet Take 20 mg by mouth daily. Takes two 20 mg tablets (40 Mg) daily.    . Vitamin D, Ergocalciferol, (DRISDOL) 50000 UNITS CAPS capsule Take 1 capsule (50,000 Units total) by mouth every 7 (seven) days. (Patient taking differently: Take 50,000 Units by mouth every 7 (seven) days. On Monday) 30 capsule 3  .  HYDROcodone-acetaminophen (NORCO/VICODIN) 5-325 MG tablet Take 1 tablet by mouth every 6 (six) hours as needed for moderate pain. (Patient not taking: Reported on 10/31/2015) 30 tablet 0   No current facility-administered medications for this visit.    REVIEW OF SYSTEMS:  [X]  denotes positive finding, [ ]  denotes negative finding Cardiac  Comments:  Chest pain or chest pressure:    Shortness of breath upon exertion:    Short of breath when lying flat:    Irregular heart rhythm:        Vascular    Pain in calf, thigh, or hip brought on by ambulation:    Pain in feet at night that wakes you up from your sleep:     Blood clot in your veins:    Leg swelling:  X       Pulmonary    Oxygen at home:    Productive  cough:     Wheezing:         Neurologic    Sudden weakness in arms or legs:     Sudden numbness in arms or legs:     Sudden onset of difficulty speaking or slurred speech:    Temporary loss of vision in one eye:     Problems with dizziness:         Gastrointestinal    Blood in stool:     Vomited blood:         Genitourinary    Burning when urinating:     Blood in urine:        Psychiatric    Major depression:         Hematologic    Bleeding problems:    Problems with blood clotting too easily:        Skin    Rashes or ulcers:        Constitutional    Fever or chills:      PHYSICAL EXAM: Filed Vitals:   02/13/16 1053 02/13/16 1057  BP: 151/91 164/99  Pulse: 86   Temp: 97.8 F (36.6 C)   TempSrc: Oral   Resp: 18   Height: 5' 8.5" (1.74 m)   Weight: 200 lb (90.719 kg)   SpO2: 100%     GENERAL: The patient is a well-nourished male, in no acute distress. The vital signs are documented above. CARDIAC: There is a regular rate and rhythm.  VASCULAR: he has palpable brachial and radial pulses bilaterally. PULMONARY: There is good air exchange bilaterally without wheezing or rales. ABDOMEN: Soft and non-tender with normal pitched bowel sounds.  MUSCULOSKELETAL: There are no major deformities or cyanosis. NEUROLOGIC: No focal weakness or paresthesias are detected. SKIN: There are no ulcers or rashes noted. PSYCHIATRIC: The patient has a normal affect.  DATA:   VEIN MAP: His vein map on 03/28/2015, shows that his forearm and upper arm cephalic vein do not appear to be adequate on the right. His basilic vein might be usable.  MEDICAL ISSUES:  STAGE IV CHRONIC KIDNEY DISEASE: I have recommended placement of an AV fistula or AV graft in the left arm. I have explained the indications for placement of an AV fistula or AV graft. I've explained that if at all possible we will place an AV fistula.  I have reviewed the risks of placement of an AV fistula including but not  limited to: failure of the fistula to mature, need for subsequent interventions, and thrombosis. In addition I have reviewed the potential complications of placement of an AV  graft. These risks include, but are not limited to, graft thrombosis, graft infection, wound healing problems, bleeding, arm swelling, and steal syndrome. All the patient's questions were answered and they are agreeable to proceed with surgery. I have discussed this today with his court appointed guardian. His surgery is scheduled for 02/26/16.  [name] [facility] [place]: [phone]

## 2016-02-26 NOTE — Anesthesia Postprocedure Evaluation (Signed)
Anesthesia Post Note  Patient: Zachary [name]  Procedure(s) Performed: Procedure(s) (LRB): ARTERIOVENOUS (AV) FISTULA CREATION  (Right) Right BASCILIC VEIN TRANSPOSITION (Right)  Patient location during evaluation: PACU Anesthesia Type: General Level of consciousness: awake Pain management: pain level controlled Vital Signs Assessment: post-procedure vital signs reviewed and stable Respiratory status: spontaneous breathing Cardiovascular status: stable Anesthetic complications: no    Last Vitals:  Filed Vitals:   02/26/16 1500 02/26/16 1515  BP: 146/76 157/84  Pulse: 81 86  Temp:    Resp: 15     Last Pain: There were no vitals filed for this visit.               EDWARDS,Joylynn Defrancesco

## 2016-02-27 ENCOUNTER — Encounter (HOSPITAL_COMMUNITY): Payer: Self-pay | Admitting: Vascular Surgery

## 2016-02-27 DIAGNOSIS — B351 Tinea unguium: Secondary | ICD-10-CM | POA: Diagnosis not present

## 2016-02-27 DIAGNOSIS — M79675 Pain in left toe(s): Secondary | ICD-10-CM | POA: Diagnosis not present

## 2016-02-27 DIAGNOSIS — I10 Essential (primary) hypertension: Secondary | ICD-10-CM | POA: Diagnosis not present

## 2016-02-27 DIAGNOSIS — R809 Proteinuria, unspecified: Secondary | ICD-10-CM | POA: Diagnosis not present

## 2016-02-27 DIAGNOSIS — N183 Chronic kidney disease, stage 3 (moderate): Secondary | ICD-10-CM | POA: Diagnosis not present

## 2016-02-27 DIAGNOSIS — M79674 Pain in right toe(s): Secondary | ICD-10-CM | POA: Diagnosis not present

## 2016-02-28 ENCOUNTER — Telehealth: Payer: Self-pay | Admitting: Vascular Surgery

## 2016-02-28 NOTE — Telephone Encounter (Signed)
-----   Message from Mena Goes, RN sent at 02/26/2016  3:19 PM EDT ----- Regarding: schedule   ----- Message -----    From: Angelia Mould, MD    Sent: 02/26/2016   3:01 PM      To: [facility] Charge Pool Subject: charge                                         This patient had a right basilic vein transposition.  He will need a follow up visit in 6 weeks with a duplex at that time. Thank you. CD

## 2016-02-28 NOTE — Telephone Encounter (Signed)
sched lab 5/23 at 11:30 and md 6/14 at 2:45. Spoke to [facility] receptionist to inform them of appt.

## 2016-03-02 ENCOUNTER — Emergency Department (HOSPITAL_COMMUNITY)
Admission: EM | Admit: 2016-03-02 | Discharge: 2016-03-02 | Disposition: A | Discharge status: Home / Self Care | Payer: Medicare Other | Attending: Emergency Medicine | Admitting: Emergency Medicine

## 2016-03-02 ENCOUNTER — Emergency Department (HOSPITAL_COMMUNITY): Payer: Medicare Other

## 2016-03-02 ENCOUNTER — Encounter (HOSPITAL_COMMUNITY): Payer: Self-pay

## 2016-03-02 DIAGNOSIS — E039 Hypothyroidism, unspecified: Secondary | ICD-10-CM | POA: Diagnosis not present

## 2016-03-02 DIAGNOSIS — E119 Type 2 diabetes mellitus without complications: Secondary | ICD-10-CM | POA: Insufficient documentation

## 2016-03-02 DIAGNOSIS — R112 Nausea with vomiting, unspecified: Secondary | ICD-10-CM | POA: Diagnosis not present

## 2016-03-02 DIAGNOSIS — Z79899 Other long term (current) drug therapy: Secondary | ICD-10-CM | POA: Diagnosis not present

## 2016-03-02 DIAGNOSIS — R1111 Vomiting without nausea: Secondary | ICD-10-CM | POA: Diagnosis not present

## 2016-03-02 DIAGNOSIS — R110 Nausea: Secondary | ICD-10-CM

## 2016-03-02 DIAGNOSIS — N186 End stage renal disease: Secondary | ICD-10-CM | POA: Insufficient documentation

## 2016-03-02 DIAGNOSIS — E785 Hyperlipidemia, unspecified: Secondary | ICD-10-CM | POA: Insufficient documentation

## 2016-03-02 DIAGNOSIS — Z7984 Long term (current) use of oral hypoglycemic drugs: Secondary | ICD-10-CM | POA: Insufficient documentation

## 2016-03-02 DIAGNOSIS — I120 Hypertensive chronic kidney disease with stage 5 chronic kidney disease or end stage renal disease: Secondary | ICD-10-CM | POA: Diagnosis not present

## 2016-03-02 DIAGNOSIS — R000 Tachycardia, unspecified: Secondary | ICD-10-CM | POA: Diagnosis not present

## 2016-03-02 LAB — CBC WITH DIFFERENTIAL/PLATELET
Basophils Absolute: 0 10*3/uL (ref 0.0–0.1)
Basophils Relative: 0 %
Eosinophils Absolute: 0.2 10*3/uL (ref 0.0–0.7)
Eosinophils Relative: 3 %
HCT: 28.9 % — ABNORMAL LOW (ref 39.0–52.0)
Hemoglobin: 9.4 g/dL — ABNORMAL LOW (ref 13.0–17.0)
Lymphocytes Relative: 20 %
Lymphs Abs: 1.2 10*3/uL (ref 0.7–4.0)
MCH: 33.9 pg (ref 26.0–34.0)
MCHC: 32.5 g/dL (ref 30.0–36.0)
MCV: 104.3 fL — ABNORMAL HIGH (ref 78.0–100.0)
Monocytes Absolute: 0.6 10*3/uL (ref 0.1–1.0)
Monocytes Relative: 10 %
Neutro Abs: 3.9 10*3/uL (ref 1.7–7.7)
Neutrophils Relative %: 67 %
Platelets: 122 10*3/uL — ABNORMAL LOW (ref 150–400)
RBC: 2.77 MIL/uL — ABNORMAL LOW (ref 4.22–5.81)
RDW: 13.3 % (ref 11.5–15.5)
WBC: 5.8 10*3/uL (ref 4.0–10.5)

## 2016-03-02 LAB — COMPREHENSIVE METABOLIC PANEL
ALT: 17 U/L (ref 17–63)
AST: 18 U/L (ref 15–41)
Albumin: 3.8 g/dL (ref 3.5–5.0)
Alkaline Phosphatase: 60 U/L (ref 38–126)
Anion gap: 17 — ABNORMAL HIGH (ref 5–15)
BUN: 79 mg/dL — ABNORMAL HIGH (ref 6–20)
CO2: 20 mmol/L — ABNORMAL LOW (ref 22–32)
Calcium: 9.5 mg/dL (ref 8.9–10.3)
Chloride: 111 mmol/L (ref 101–111)
Creatinine, Ser: 6.28 mg/dL — ABNORMAL HIGH (ref 0.61–1.24)
GFR calc Af Amer: 10 mL/min — ABNORMAL LOW (ref >60)
GFR calc non Af Amer: 8 mL/min — ABNORMAL LOW (ref >60)
Glucose, Bld: 173 mg/dL — ABNORMAL HIGH (ref 65–99)
Potassium: 5.2 mmol/L — ABNORMAL HIGH (ref 3.5–5.1)
Sodium: 148 mmol/L — ABNORMAL HIGH (ref 135–145)
Total Bilirubin: 0.8 mg/dL (ref 0.3–1.2)
Total Protein: 7.7 g/dL (ref 6.5–8.1)

## 2016-03-02 LAB — I-STAT CG4 LACTIC ACID, ED: Lactic Acid, Venous: 0.96 mmol/L (ref 0.5–2.0)

## 2016-03-02 MED ORDER — ONDANSETRON 4 MG PREPACK (~~LOC~~): 1.0000 | ORAL_TABLET | Freq: Three times a day (TID) | ORAL | Status: DC | PRN

## 2016-03-02 MED ORDER — SODIUM CHLORIDE 0.9 % IV BOLUS (SEPSIS)
500.0000 mL | Freq: Once | INTRAVENOUS | Status: AC
  Administered 2016-03-02: 09:00:00 via INTRAVENOUS

## 2016-03-02 MED ORDER — ONDANSETRON HCL 4 MG/2ML IJ SOLN
4.0000 mg | Freq: Once | INTRAMUSCULAR | Status: AC
  Administered 2016-03-02: 4 mg via INTRAVENOUS
  Filled 2016-03-02: qty 2

## 2016-03-02 MED ORDER — ONDANSETRON 4 MG PREPACK (~~LOC~~): ORAL_TABLET | ORAL | Status: DC

## 2016-03-02 NOTE — ED Notes (Signed)
Pt tolerating ginger ale po.

## 2016-03-02 NOTE — ED Notes (Signed)
Pt has tolerated po fluids without vomiting

## 2016-03-02 NOTE — ED Notes (Signed)
Pt given ginger ale to drink. 

## 2016-03-02 NOTE — ED Notes (Signed)
Called and gave report to [facility]. They will transport pt back to facility

## 2016-03-02 NOTE — Discharge Instructions (Signed)
Chronic Kidney Disease Chronic kidney disease happens when the kidneys are damaged over a long period. The kidneys are two organs that do many important jobs in the body. These jobs include:  Removing wastes and extra fluids from the blood.  Making hormones that help to keep the body healthy.  Making sure that the body has the right amount of fluids and chemicals. Chronic kidney disease may be caused by many things. The kidney damage occurs slowly. If too much damage occurs, the kidneys may stop working the way that they should. This is dangerous. Treatment can help to slow down the damage and keep it from getting worse. HOME CARE  Follow your diet as told by your doctor. You may need to limit the amount of salt (sodium) and protein that you eat each day.  Take medicines only as told by your doctor. Do not take any new medicines unless your doctor approves it.  Quit smoking if you smoke. Talk to your doctor about programs that may help you quit smoking.  Have your blood pressure checked regularly and keep track of the results.  Start or keep doing an exercise plan.  Get shots (immunizations) as told by your doctor.  Take vitamins and minerals as told by your doctor.  Keep all follow-up visits as told by your doctor. This is important. GET HELP RIGHT AWAY IF:   Your symptoms get worse.  You have new symptoms.  You have symptoms of end-stage kidney disease. These include:  Headaches.  Skin that is darker or lighter than normal.  Numbness in the hands or feet.  Easy bruising.  Frequent hiccups.  Stopping of menstrual periods in women.  You have a fever.  You are making very little pee (urine).  You have pain or bleeding when you pee.   This information is not intended to replace advice given to you by your health care provider. Make sure you discuss any questions you have with your health care provider.   Document Released: 01/07/2010 Document Revised: 07/04/2015  Document Reviewed: 06/11/2012 Elsevier Interactive Patient Education 2016 [place] for Chronic Kidney Disease When your kidneys are not working well, they cannot remove waste and excess substances from your blood as effectively as they did before. This can lead to a buildup and imbalance of these substances, which can affect how your body functions. This buildup can also make your kidneys work harder, causing even more damage. You may need to eat less of certain foods that can lead to the buildup of these substances in your body. By making the changes to your diet that are recommended by your dietitian or health care provider, you could possibly help prevent further kidney damage and delay or prevent the need for dialysis. The following information can help give you a basic understanding of these substances and how they affect your bodily functions. The information also gives examples of foods that contain the highest amounts of these substances. WHAT DO I NEED TO KNOW ABOUT SUBSTANCES IN MY FOOD THAT I MAY NEED TO ADJUST? Food adjustments will be different for each person with chronic kidney disease. It is important that you see a dietitian who can help you determine the specific adjustments that you will need to make for each of the following substances: Potassium Potassium affects how steadily your heart beats. If too much potassium builds up in your blood, it can cause an irregular heartbeat or even a heart attack. Examples of foods rich in potassium  include:  Milk.  Fruits.  Vegetables. Phosphorus Phosphorus is a mineral found in your bones. A balance between calcium and phosphorous is needed to build and maintain healthy bones. Too much phosphorus pulls calcium from your bones. This can make your bones weak and more likely to break. Too much phosphorus can also make your skin itch. Examples of foods rich in phosphorus include:  Milk and cheese.  Dried  beans.  Peas.  Colas.  Nuts and peanut butter. Animal Protein Animal protein helps you make and keep muscle. It also helps in the repair of your body's cells and tissues. One of the natural breakdown products of protein is a waste product called urea. When your kidneys are not working properly, they cannot remove wastes such as urea like they did before you developed chronic kidney disease. You will likely need to limit the amount of protein you eat to help prevent a buildup of urea in your blood. Examples of animal protein include:  Meat (all types).  Fish and seafood.  Poultry.  Eggs. Sodium Sodium, which is found in salt, helps maintain a healthy balance of fluids in your body. Too much sodium can increase your blood pressure level and have a negative affect on the function of your heart and lungs. Too much sodium also can cause your body to retain too much fluid, making your kidneys work harder. Examples of foods with high levels of sodium include:  Salt seasonings.  Soy sauce.  Cured and processed meats.  Salted crackers and snack foods.  Fast food.  Canned soups and most canned foods. Glucose Glucose provides energy for your body. If you have diabetes mellitus that is not properly controlled, you have too much glucose in your blood. Too much glucose in your blood can worsen the function of your kidneys by damaging small blood vessels. This prevents enough blood flow to your kidneys to give them what they need to work. If you have diabetes mellitus and chronic kidney disease, it is important to maintain your blood glucose at a level recommended by your health care provider. SHOULD I TAKE A VITAMIN AND MINERAL SUPPLEMENT? Because you may need to avoid eating certain foods, you may not get all of the vitamins and minerals that would normally come from those foods. Your health care provider or dietitian may recommend that you take a supplement to ensure that you get all of the  vitamins and minerals that your body needs.    This information is not intended to replace advice given to you by your health care provider. Make sure you discuss any questions you have with your health care provider.   Document Released: 01/03/2003 Document Revised: 11/03/2014 Document Reviewed: 09/09/2013 Elsevier Interactive Patient Education 2016 Elsevier Inc[employer]  Nausea, Adult Nausea means you feel sick to your stomach or need to throw up (vomit). It may be a sign of a more serious problem. If nausea gets worse, you may throw up. If you throw up a lot, you may lose too much body fluid (dehydration). HOME CARE   Get plenty of rest.  Ask your doctor how to replace body fluid losses (rehydrate).  Eat small amounts of food. Sip liquids more often.  Take all medicines as told by your doctor. GET HELP RIGHT AWAY IF:  You have a fever.  You pass out (faint).  You keep throwing up or have blood in your throw up.  You are very weak, have dry lips or a dry mouth, or you are  very thirsty (dehydrated).  You have dark or bloody poop (stool).  You have very bad chest or belly (abdominal) pain.  You do not get better after 2 days, or you get worse.  You have a headache. MAKE SURE YOU:  Understand these instructions.  Will watch your condition.  Will get help right away if you are not doing well or get worse.   This information is not intended to replace advice given to you by your health care provider. Make sure you discuss any questions you have with your health care provider.   Document Released: 10/02/2011 Document Revised: 01/05/2012 Document Reviewed: 10/02/2011 Elsevier Interactive Patient Education [employer].

## 2016-03-02 NOTE — ED Notes (Signed)
Lactic Acid 0.96

## 2016-03-02 NOTE — ED Notes (Signed)
Pt brought in from [facility] via EMS. Staff reports poor appetite, since Friday. Also reports nausea and vomiting. Denies diarrhea. New av graft in right upper arm. Incision without redness, warmth or swelling

## 2016-03-02 NOTE — ED Provider Notes (Signed)
CSN: [id number]     Arrival date & time 03/02/16  0827 History  By signing my name below, I, Soijett Blue, attest that this documentation has been prepared under the direction and in the presence of Leonard Schwartz, MD. Electronically Signed: Soijett Blue, ED Scribe. 03/02/2016. 10:24 AM.   Chief Complaint  Patient presents with  . Emesis   LEVEL 5 CAVEAT: MR   The history is provided by the EMS personnel and the nursing home. No language interpreter was used.   HPI Comments: Zachary [name] is a 70 y.o. male with a medical hx of being a poor hx, MR, HTN, DM,  who presents to the Emergency Department via EMS from [facility] complaining of emesis onset 3 days. Hx given via EMS due to pt being a poor historian due to PMHx of MR. Staff at [facility] noted that the pt has not had an appetite x 3 days, mild abdominal pain, and nausea. Denies diarrhea at this time.    Past Medical History  Diagnosis Date  . Hypertension   . Diabetes mellitus   . Smoking   . Abnormal CT scan, kidney 10/06/2011  . UTI (lower urinary tract infection) 10/06/2011  . Bladder wall thickening 10/06/2011  . 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 Poor historian poor historian  . BPH (benign prostatic hypertrophy)   . Anxiety     mental retardation  . Hypothyroidism   . MR (mental retardation)   . Acute pyelonephritis 10/07/2011  . Hyperkalemia   . Renal insufficiency     chronic history  . Renal failure (ARF), acute on chronic (HCC)   . Anemia     normocytic  . Edema      history of lower extremity edema  . Hyperlipidemia   . Impaired speech    Past Surgical History  Procedure Laterality Date  . Cystoscopy with urethral dilatation N/A 12/29/2013    Procedure: CYSTOSCOPY WITH URETHRAL DILATATION;  Surgeon: Marissa Nestle, MD;  Location: [facility];  Service: Urology;  Laterality: N/A;  . Transurethral resection of prostate N/A 01/04/2014    Procedure: TRANSURETHRAL RESECTION OF THE PROSTATE (TURP) (procedure #2);   Surgeon: Marissa Nestle, MD;  Location: [facility];  Service: Urology;  Laterality: N/A;  . Circumcision N/A 01/04/2014    Procedure: CIRCUMCISION ADULT (procedure #1);  Surgeon: Marissa Nestle, MD;  [facility]: [facility];  Service: Urology;  Laterality: N/A;  . Cystoscopy w/ retrogrades Bilateral 06/29/2015    Procedure: CYSTOSCOPY, DILATION OF URETHRAL STRICTURE WITH BILATERAL RETROGRADE PYELOGRAM,SUPRAPUBIC TUBE CHANGE;  Surgeon: Festus Aloe, MD;  [facility]: [facility];  Service: Urology;  Laterality: Bilateral;  . Av fistula placement Left 07/06/2015    Procedure:  INSERTION LEFT ARM ARTERIOVENOUS GORTEX GRAFT;  Surgeon: Angelia Mould, MD;  Location: [place];  Service: Vascular;  Laterality: Left;  . Peripheral vascular catheterization N/A 10/08/2015    Procedure: A/V Shuntogram;  Surgeon: Angelia Mould, MD;  Location: [place] [facility];  Service: Cardiovascular;  Laterality: N/A;  . Avgg removal Left 10/09/2015    Procedure: REMOVAL OF ARTERIOVENOUS GORETEX GRAFT ([place]) Evacuation of Lymphocele, Vein Patch angioplasty of brachial artery.;  Surgeon: Angelia Mould, MD;  Location: [place];  Service: Vascular;  Laterality: Left;  . Av fistula placement Right 02/26/2016    Procedure: ARTERIOVENOUS (AV) FISTULA CREATION ;  Surgeon: Angelia Mould, MD;  Location: [place];  Service: Vascular;  Laterality: Right;  . Bascilic vein transposition Right 02/26/2016  Procedure: Right [place];  Surgeon: Angelia Mould, MD;  Location: [facility];  Service: Vascular;  Laterality: Right;   Family History  Problem Relation Age of Onset  . Cancer Mother    Social History  Substance Use Topics  . Smoking status: Never Smoker   . Smokeless tobacco: Never Used  . Alcohol Use: No     Comment: occ. use     Review of Systems  Unable to perform ROS: Patient nonverbal      Allergies  Review of patient's allergies indicates no known allergies.  Home Medications    Prior to Admission medications   Medication Sig Start Date End Date Taking? Authorizing Provider  carvedilol (COREG) 6.25 MG tablet Take 1 tablet (6.25 mg total) by mouth 2 (two) times daily with a meal. 03/08/15  Yes [name], MD  ferrous sulfate 325 (65 FE) MG tablet Take 325 mg by mouth daily with breakfast.   Yes Historical Provider, MD  isosorbide-hydrALAZINE (BIDIL) 20-37.5 MG per tablet Take 1 tablet by mouth 3 (three) times daily. 03/08/15  Yes [name], MD  levothyroxine (SYNTHROID, LEVOTHROID) 75 MCG tablet Take 75 mcg by mouth daily before breakfast.    Yes Historical Provider, MD  linagliptin (TRADJENTA) 5 MG TABS tablet Take 5 mg by mouth daily.   Yes Historical Provider, MD  niacin (NIASPAN) 1000 MG CR tablet Take 1,000 mg by mouth at bedtime.   Yes Historical Provider, MD  omeprazole (PRILOSEC) 20 MG capsule Take 40 mg by mouth daily.   Yes Historical Provider, MD  oxyCODONE-acetaminophen (ROXICET) 5-325 MG tablet Take 1 tablet by mouth every 6 (six) hours as needed. Patient taking differently: Take 1 tablet by mouth every 6 (six) hours as needed for moderate pain.  02/26/16  Yes Alvia Grove, PA-C  simvastatin (ZOCOR) 20 MG tablet Take 20 mg by mouth at bedtime.  02/28/15  Yes Historical Provider, MD  sodium polystyrene (KAYEXALATE) 15 GM/60ML suspension Take 30 g by mouth every Monday, Wednesday, and Friday. Drinks entire content of 1 bottle (30 gm) once daily on Monday/Wednesday/Friday   Yes Historical Provider, MD  tamsulosin (FLOMAX) 0.4 MG CAPS capsule Take 0.4 mg by mouth daily.   Yes Historical Provider, MD  torsemide (DEMADEX) 20 MG tablet Take 40 mg by mouth daily.  06/13/15  Yes Historical Provider, MD  Vitamin D, Ergocalciferol, (DRISDOL) 50000 UNITS CAPS capsule Take 1 capsule (50,000 Units total) by mouth every 7 (seven) days. Patient taking differently: Take 50,000 Units by mouth every Monday.  03/08/15  Yes [name], MD  ondansetron (ZOFRAN) 4 mg TABS  tablet Take 4 tablets by mouth every 8 (eight) hours as needed. 03/02/16   Leonard Schwartz, MD   BP 130/74 mmHg  Pulse 109  Temp(Src) 98 F (36.7 C) (Oral)  Resp 15  SpO2 98% Physical Exam  Constitutional: He appears well-developed and well-nourished. No distress.  HENT:  Head: Normocephalic and atraumatic.  Eyes: Pupils are equal, round, and reactive to light.  Neck: Normal range of motion.  Cardiovascular: Normal rate and intact distal pulses.   Pulmonary/Chest: No respiratory distress.  Abdominal: Soft. Normal appearance and bowel sounds are normal. He exhibits no distension. There is no tenderness. There is no rebound and no guarding.  Musculoskeletal: Normal range of motion.  Good thrill noted in AV fistula left arm  Neurological: He is alert. No cranial nerve deficit.  Skin: Skin is warm and dry. No rash noted.  Psychiatric: He has a  normal mood and affect. His behavior is normal.  Nursing note and vitals reviewed.   ED Course  Procedures (including critical care time) Medications  sodium chloride 0.9 % bolus 500 mL ( Intravenous New Bag/Given 03/02/16 0852)  ondansetron (ZOFRAN) injection 4 mg (4 mg Intravenous Given 03/02/16 0851)    DIAGNOSTIC STUDIES: Oxygen Saturation is 98% on RA, nl by my interpretation.    COORDINATION OF CARE: 8:37 AM Discussed treatment plan with pt at bedside and pt agreed to plan.    Labs Review Labs Reviewed  COMPREHENSIVE METABOLIC PANEL - Abnormal; Notable for the following:    Sodium 148 (*)    Potassium 5.2 (*)    CO2 20 (*)    Glucose, Bld 173 (*)    BUN 79 (*)    Creatinine, Ser 6.28 (*)    GFR calc non Af Amer 8 (*)    GFR calc Af Amer 10 (*)    Anion gap 17 (*)    All other components within normal limits  CBC WITH DIFFERENTIAL/PLATELET - Abnormal; Notable for the following:    RBC 2.77 (*)    Hemoglobin 9.4 (*)    HCT 28.9 (*)    MCV 104.3 (*)    Platelets 122 (*)    All other components within normal limits  I-STAT CG4  LACTIC ACID, ED    Imaging Review Dg Abd Acute W/chest  03/02/2016  CLINICAL DATA:  Vomiting all week. EXAM: DG ABDOMEN ACUTE W/ 1V CHEST COMPARISON:  None. FINDINGS: The heart, hila, and mediastinum are normal. No pulmonary nodules, masses, or infiltrates. No free air, portal venous gas, or pneumatosis. There is air in the descending colon. There is also an oval air-filled structure in the left mid abdomen measuring 5.6 cm. Whether this is colon or small bowel is unclear on this study. There is a paucity of gas elsewhere throughout the abdomen. No other acute abnormalities. IMPRESSION: No convincing evidence of obstruction. A mildly prominent collection of air in the left mid abdomen could be in colon or small bowel. The remainder of the bowel gas pattern is unremarkable. Recommend follow-up as clinically warranted. Electronically Signed   By: Dorise Bullion III M.D   On: 03/02/2016 09:12   I have personally reviewed and evaluated these images and lab results as part of my medical decision-making.   Patient given fluid challenge after administration of Zofran.  No further nausea and vomiting.  MDM   Final diagnoses:  End stage renal disease ([place])  Nausea    I personally performed the services described in this documentation, which was scribed in my presence. The recorded information has been reviewed and considered.   Leonard Schwartz, MD 03/02/16 1024

## 2016-03-05 DIAGNOSIS — E559 Vitamin D deficiency, unspecified: Secondary | ICD-10-CM | POA: Diagnosis not present

## 2016-03-05 DIAGNOSIS — I10 Essential (primary) hypertension: Secondary | ICD-10-CM | POA: Diagnosis not present

## 2016-03-05 DIAGNOSIS — R809 Proteinuria, unspecified: Secondary | ICD-10-CM | POA: Diagnosis not present

## 2016-03-05 DIAGNOSIS — D509 Iron deficiency anemia, unspecified: Secondary | ICD-10-CM | POA: Diagnosis not present

## 2016-03-05 DIAGNOSIS — Z79899 Other long term (current) drug therapy: Secondary | ICD-10-CM | POA: Diagnosis not present

## 2016-03-05 DIAGNOSIS — N184 Chronic kidney disease, stage 4 (severe): Secondary | ICD-10-CM | POA: Diagnosis not present

## 2016-03-06 ENCOUNTER — Emergency Department (HOSPITAL_COMMUNITY)
Admission: EM | Admit: 2016-03-06 | Discharge: 2016-03-06 | Disposition: A | Discharge status: Home / Self Care | Payer: Medicare Other | Attending: Emergency Medicine | Admitting: Emergency Medicine

## 2016-03-06 ENCOUNTER — Emergency Department (HOSPITAL_COMMUNITY): Payer: Medicare Other

## 2016-03-06 ENCOUNTER — Encounter (HOSPITAL_COMMUNITY): Payer: Self-pay | Admitting: Emergency Medicine

## 2016-03-06 DIAGNOSIS — N179 Acute kidney failure, unspecified: Secondary | ICD-10-CM | POA: Insufficient documentation

## 2016-03-06 DIAGNOSIS — E785 Hyperlipidemia, unspecified: Secondary | ICD-10-CM | POA: Diagnosis not present

## 2016-03-06 DIAGNOSIS — R112 Nausea with vomiting, unspecified: Secondary | ICD-10-CM

## 2016-03-06 DIAGNOSIS — N186 End stage renal disease: Secondary | ICD-10-CM | POA: Diagnosis not present

## 2016-03-06 DIAGNOSIS — Z7984 Long term (current) use of oral hypoglycemic drugs: Secondary | ICD-10-CM | POA: Insufficient documentation

## 2016-03-06 DIAGNOSIS — E039 Hypothyroidism, unspecified: Secondary | ICD-10-CM | POA: Insufficient documentation

## 2016-03-06 DIAGNOSIS — N189 Chronic kidney disease, unspecified: Secondary | ICD-10-CM | POA: Diagnosis not present

## 2016-03-06 DIAGNOSIS — E1122 Type 2 diabetes mellitus with diabetic chronic kidney disease: Secondary | ICD-10-CM | POA: Insufficient documentation

## 2016-03-06 DIAGNOSIS — I120 Hypertensive chronic kidney disease with stage 5 chronic kidney disease or end stage renal disease: Secondary | ICD-10-CM | POA: Diagnosis not present

## 2016-03-06 LAB — CBC WITH DIFFERENTIAL/PLATELET
Basophils Absolute: 0 10*3/uL (ref 0.0–0.1)
Basophils Relative: 0 %
Eosinophils Absolute: 0.2 10*3/uL (ref 0.0–0.7)
Eosinophils Relative: 2 %
HCT: 30.6 % — ABNORMAL LOW (ref 39.0–52.0)
Hemoglobin: 9.9 g/dL — ABNORMAL LOW (ref 13.0–17.0)
Lymphocytes Relative: 21 %
Lymphs Abs: 1.4 10*3/uL (ref 0.7–4.0)
MCH: 33.7 pg (ref 26.0–34.0)
MCHC: 32.4 g/dL (ref 30.0–36.0)
MCV: 104.1 fL — ABNORMAL HIGH (ref 78.0–100.0)
Monocytes Absolute: 0.5 10*3/uL (ref 0.1–1.0)
Monocytes Relative: 7 %
Neutro Abs: 4.7 10*3/uL (ref 1.7–7.7)
Neutrophils Relative %: 70 %
Platelets: 127 10*3/uL — ABNORMAL LOW (ref 150–400)
RBC: 2.94 MIL/uL — ABNORMAL LOW (ref 4.22–5.81)
RDW: 13.4 % (ref 11.5–15.5)
WBC: 6.8 10*3/uL (ref 4.0–10.5)

## 2016-03-06 LAB — BASIC METABOLIC PANEL
Anion gap: 10 (ref 5–15)
BUN: 76 mg/dL — ABNORMAL HIGH (ref 6–20)
CO2: 26 mmol/L (ref 22–32)
Calcium: 9.4 mg/dL (ref 8.9–10.3)
Chloride: 110 mmol/L (ref 101–111)
Creatinine, Ser: 6.62 mg/dL — ABNORMAL HIGH (ref 0.61–1.24)
GFR calc Af Amer: 9 mL/min — ABNORMAL LOW (ref >60)
GFR calc non Af Amer: 8 mL/min — ABNORMAL LOW (ref >60)
Glucose, Bld: 194 mg/dL — ABNORMAL HIGH (ref 65–99)
Potassium: 4.3 mmol/L (ref 3.5–5.1)
Sodium: 146 mmol/L — ABNORMAL HIGH (ref 135–145)

## 2016-03-06 MED ORDER — ONDANSETRON 4 MG PO TBDP
4.0000 mg | ORAL_TABLET | Freq: Once | ORAL | Status: AC
  Administered 2016-03-06: 4 mg via ORAL
  Filled 2016-03-06: qty 1

## 2016-03-06 MED ORDER — ONDANSETRON 4 MG PO TBDP: 4.0000 mg | ORAL_TABLET | Freq: Three times a day (TID) | ORAL | Status: DC | PRN

## 2016-03-06 NOTE — Discharge Instructions (Signed)
Patient stable here without any vomiting. X-rays of the abdomen and chest without any fluid on the lungs no evidence of any abdominal process problem. No evidence of obstruction. Patient's labs improved from his recent visit to the ED on the seventh.  Patient stable for discharge back to nursing facility would recommend follow-up with primary care doctor. Would recommend the Zofran as needed for nausea and vomiting.

## 2016-03-06 NOTE — ED Provider Notes (Signed)
CSN: [id number]     Arrival date & time 03/06/16  [id number] History  By signing my name below, I, Stephania Fragmin, attest that this documentation has been prepared under the direction and in the presence of Fredia Sorrow, MD. Electronically Signed: Stephania Fragmin, ED Scribe. 03/06/2016. 11:23 AM.   Chief Complaint  Patient presents with  . Emesis   The history is provided by the patient. History limited by: MR. No language interpreter was used.   LEVEL CAVEAT DUE TO: MR  HPI Comments: Zachary [name] is a 70 y.o. male wiuth a history of MR, renal failure, HTN, and DM, who presents to the Emergency Department complaining of constant emesis that began about 1 week ago. Patient reports he was seen for the same about 4 days ago. Patient states he is not currently receiving dialysis. He denies any pain anywhere.     Past Medical History  Diagnosis Date  . Hypertension   . Diabetes mellitus   . Smoking   . Abnormal CT scan, kidney 10/06/2011  . UTI (lower urinary tract infection) 10/06/2011  . Bladder wall thickening 10/06/2011  . 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 Poor historian poor historian  . BPH (benign prostatic hypertrophy)   . Anxiety     mental retardation  . Hypothyroidism   . MR (mental retardation)   . Acute pyelonephritis 10/07/2011  . Hyperkalemia   . Renal insufficiency     chronic history  . Renal failure (ARF), acute on chronic (HCC)   . Anemia     normocytic  . Edema      history of lower extremity edema  . Hyperlipidemia   . Impaired speech    Past Surgical History  Procedure Laterality Date  . Cystoscopy with urethral dilatation N/A 12/29/2013    Procedure: CYSTOSCOPY WITH URETHRAL DILATATION;  Surgeon: Marissa Nestle, MD;  Location: [facility];  Service: Urology;  Laterality: N/A;  . Transurethral resection of prostate N/A 01/04/2014    Procedure: TRANSURETHRAL RESECTION OF THE PROSTATE (TURP) (procedure #2);  Surgeon: Marissa Nestle, MD;  [facility];  Service:  Urology;  Laterality: N/A;  . Circumcision N/A 01/04/2014    Procedure: CIRCUMCISION ADULT (procedure #1);  Surgeon: Marissa Nestle, MD;  [facility];  Service: Urology;  Laterality: N/A;  . Cystoscopy w/ retrogrades Bilateral 06/29/2015    Procedure: CYSTOSCOPY, DILATION OF URETHRAL STRICTURE WITH BILATERAL RETROGRADE PYELOGRAM,SUPRAPUBIC TUBE CHANGE;  Surgeon: Festus Aloe, MD;  [facility];  Service: Urology;  Laterality: Bilateral;  . Av fistula placement Left 07/06/2015    Procedure:  INSERTION LEFT ARM ARTERIOVENOUS GORTEX GRAFT;  Surgeon: Angelia Mould, MD;  Location: [place];  Service: Vascular;  Laterality: Left;  . Peripheral vascular catheterization N/A 10/08/2015    Procedure: A/V Shuntogram;  Surgeon: Angelia Mould, MD;  Location: [place] [facility];  Service: Cardiovascular;  Laterality: N/A;  . Avgg removal Left 10/09/2015    Procedure: REMOVAL OF ARTERIOVENOUS GORETEX GRAFT ([place]) Evacuation of Lymphocele, Vein Patch angioplasty of brachial artery.;  Surgeon: Angelia Mould, MD;  Location: [place];  Service: Vascular;  Laterality: Left;  . Av fistula placement Right 02/26/2016    Procedure: ARTERIOVENOUS (AV) FISTULA CREATION ;  Surgeon: Angelia Mould, MD;  Location: [place];  Service: Vascular;  Laterality: Right;  . Bascilic vein transposition Right 02/26/2016    Procedure: Right BASCILIC VEIN TRANSPOSITION;  Surgeon: Angelia Mould, MD;  Location: [place];  Service: Vascular;  Laterality:  Right;   Family History  Problem Relation Age of Onset  . Cancer Mother    Social History  Substance Use Topics  . Smoking status: Never Smoker   . Smokeless tobacco: Never Used  . Alcohol Use: No     Comment: occ. use     Review of Systems  Unable to perform ROS: Other    Allergies  Review of patient's allergies indicates no known allergies.  Home Medications   Prior to Admission medications   Medication Sig Start Date End Date Taking?  Authorizing Provider  carvedilol (COREG) 6.25 MG tablet Take 1 tablet (6.25 mg total) by mouth 2 (two) times daily with a meal. 03/08/15   [name], MD  ferrous sulfate 325 (65 FE) MG tablet Take 325 mg by mouth daily with breakfast.    Historical Provider, MD  isosorbide-hydrALAZINE (BIDIL) 20-37.5 MG per tablet Take 1 tablet by mouth 3 (three) times daily. 03/08/15   [name], MD  levothyroxine (SYNTHROID, LEVOTHROID) 75 MCG tablet Take 75 mcg by mouth daily before breakfast.     Historical Provider, MD  linagliptin (TRADJENTA) 5 MG TABS tablet Take 5 mg by mouth daily.    Historical Provider, MD  niacin (NIASPAN) 1000 MG CR tablet Take 1,000 mg by mouth at bedtime.    Historical Provider, MD  omeprazole (PRILOSEC) 20 MG capsule Take 40 mg by mouth daily.    Historical Provider, MD  ondansetron (ZOFRAN ODT) 4 MG disintegrating tablet Take 1 tablet (4 mg total) by mouth every 8 (eight) hours as needed. 03/06/16   Fredia Sorrow, MD  ondansetron (ZOFRAN) 4 mg TABS tablet Take 1 tablet by mouth every 8 hours when necessary nausea. 03/02/16   Leonard Schwartz, MD  oxyCODONE-acetaminophen (ROXICET) 5-325 MG tablet Take 1 tablet by mouth every 6 (six) hours as needed. Patient taking differently: Take 1 tablet by mouth every 6 (six) hours as needed for moderate pain.  02/26/16   Alvia Grove, PA-C  simvastatin (ZOCOR) 20 MG tablet Take 20 mg by mouth at bedtime.  02/28/15   Historical Provider, MD  sodium polystyrene (KAYEXALATE) 15 GM/60ML suspension Take 30 g by mouth every Monday, Wednesday, and Friday. Drinks entire content of 1 bottle (30 gm) once daily on Monday/Wednesday/Friday    Historical Provider, MD  tamsulosin (FLOMAX) 0.4 MG CAPS capsule Take 0.4 mg by mouth daily.    Historical Provider, MD  torsemide (DEMADEX) 20 MG tablet Take 40 mg by mouth daily.  06/13/15   Historical Provider, MD  Vitamin D, Ergocalciferol, (DRISDOL) 50000 UNITS CAPS capsule Take 1 capsule (50,000 Units total) by  mouth every 7 (seven) days. Patient taking differently: Take 50,000 Units by mouth every Monday.  03/08/15   [name], MD   BP 107/58 mmHg  Pulse 107  Temp(Src) 98.1 F (36.7 C) (Oral)  Resp 16  Ht 5\' 9"  (1.753 m)  Wt 90.719 kg  BMI 29.52 kg/m2  SpO2 95% Physical Exam  Constitutional: He is oriented to person, place, and time. He appears well-developed and well-nourished. No distress.  HENT:  Head: Normocephalic and atraumatic.  Mouth/Throat: Oropharynx is clear and moist.  Mucous membranes are moist.   Eyes: Conjunctivae and EOM are normal. Pupils are equal, round, and reactive to light. No scleral icterus.  Pupils are normal. Sclera is clear. Eyes track normal.    Neck: Neck supple. No tracheal deviation present.  Cardiovascular: Normal rate, regular rhythm and normal heart sounds.   Pulmonary/Chest: Effort normal and  breath sounds normal. No respiratory distress. He has no wheezes. He has no rales.  Lungs are clear to auscultation bilaterally.  Abdominal: Soft. Bowel sounds are normal. He exhibits no distension. There is no tenderness.  Musculoskeletal: Normal range of motion. He exhibits no edema.  No ankle swelling.  Right arm: AV fistula in place. Good thrill.   Neurological: He is alert and oriented to person, place, and time.  Skin: Skin is warm and dry.  Psychiatric: He has a normal mood and affect. His behavior is normal.  Nursing note and vitals reviewed.   ED Course  Procedures (including critical care time)  DIAGNOSTIC STUDIES: Oxygen Saturation is 93% on RA, normal by my interpretation.    COORDINATION OF CARE:  Labs Review Labs Reviewed  CBC WITH DIFFERENTIAL/PLATELET - Abnormal; Notable for the following:    RBC 2.94 (*)    Hemoglobin 9.9 (*)    HCT 30.6 (*)    MCV 104.1 (*)    Platelets 127 (*)    All other components within normal limits  BASIC METABOLIC PANEL - Abnormal; Notable for the following:    Sodium 146 (*)    Glucose, Bld 194 (*)     BUN 76 (*)    Creatinine, Ser 6.62 (*)    GFR calc non Af Amer 8 (*)    GFR calc Af Amer 9 (*)    All other components within normal limits   Results for orders placed or performed during the hospital encounter of 03/06/16  CBC with Differential  Result Value Ref Range   WBC 6.8 4.0 - 10.5 K/uL   RBC 2.94 (L) 4.22 - 5.81 MIL/uL   Hemoglobin 9.9 (L) 13.0 - 17.0 g/dL   HCT 30.6 (L) 39.0 - 52.0 %   MCV 104.1 (H) 78.0 - 100.0 fL   MCH 33.7 26.0 - 34.0 pg   MCHC 32.4 30.0 - 36.0 g/dL   RDW 13.4 11.5 - 15.5 %   Platelets 127 (L) 150 - 400 K/uL   Neutrophils Relative % 70 %   Neutro Abs 4.7 1.7 - 7.7 K/uL   Lymphocytes Relative 21 %   Lymphs Abs 1.4 0.7 - 4.0 K/uL   Monocytes Relative 7 %   Monocytes Absolute 0.5 0.1 - 1.0 K/uL   Eosinophils Relative 2 %   Eosinophils Absolute 0.2 0.0 - 0.7 K/uL   Basophils Relative 0 %   Basophils Absolute 0.0 0.0 - 0.1 K/uL  Basic metabolic panel  Result Value Ref Range   Sodium 146 (H) 135 - 145 mmol/L   Potassium 4.3 3.5 - 5.1 mmol/L   Chloride 110 101 - 111 mmol/L   CO2 26 22 - 32 mmol/L   Glucose, Bld 194 (H) 65 - 99 mg/dL   BUN 76 (H) 6 - 20 mg/dL   Creatinine, Ser 6.62 (H) 0.61 - 1.24 mg/dL   Calcium 9.4 8.9 - 10.3 mg/dL   GFR calc non Af Amer 8 (L) >60 mL/min   GFR calc Af Amer 9 (L) >60 mL/min   Anion gap 10 5 - 15     Imaging Review Dg Abd Acute W/chest  03/06/2016  CLINICAL DATA:  Nausea and vomiting for 6 days. History of hypertension, diabetes and renal failure. EXAM: DG ABDOMEN ACUTE W/ 1V CHEST COMPARISON:  Radiographs 03/02/2016.  CT 09/13/2015. FINDINGS: The heart size and mediastinal contours are normal. The lungs are clear. There is no pleural effusion or pneumothorax. No acute osseous findings are identified. The  bowel gas pattern is nonobstructive. There is moderate stool throughout the colon. No evidence of bowel wall thickening, pneumatosis or free air. There are no suspicious abdominal calcifications. No acute  osseous findings are seen. IMPRESSION: No evidence of acute cardiopulmonary or abdominal process. Moderate colonic stool burden suggesting constipation. Electronically Signed   By: Richardean Sale M.D.   On: 03/06/2016 12:09   I have personally reviewed and evaluated these images and lab results as part of my medical decision-making.   EKG Interpretation None      MDM   Final diagnoses:  Non-intractable vomiting with nausea, vomiting of unspecified type  End stage renal disease ([place])    patient's labs today with improvement compared to his visit on the seventh.X-rays without evidence of any acute abdominal process. Lungs show no evidence of any fluid overload. Patient does have the chronic renal insufficiency but potassium is normal no evidence of any volume overload or pulmonary edema or congestive heart failure.  Patient stable for discharge back to nursing facility. No vomiting here. Would recommend Zofran as needed for the nausea and vomiting. Per prescription provided.  I personally performed the services described in this documentation, which was scribed in my presence. The recorded information has been reviewed and is accurate.       Fredia Sorrow, MD 03/06/16 1310

## 2016-03-06 NOTE — ED Notes (Addendum)
Pt reports continued emesis and fatigue. Pt reports was seen for same on Sunday. Pt denies any pain. [place] [facility] Staff member accompanying patient but reports can not stay. Pt placed in waiting room behind triage. nad noted.

## 2016-03-06 NOTE — ED Notes (Signed)
Contacted [facility] for patient pickup and states it will be about 45 minutes.

## 2016-03-06 NOTE — ED Notes (Signed)
No vomiting noted.  Pt tolerating PO fluids well.

## 2016-03-10 ENCOUNTER — Encounter (HOSPITAL_COMMUNITY): Payer: Self-pay | Admitting: *Deleted

## 2016-03-10 ENCOUNTER — Emergency Department (HOSPITAL_COMMUNITY): Payer: Medicare Other

## 2016-03-10 ENCOUNTER — Inpatient Hospital Stay (HOSPITAL_COMMUNITY)
Admission: EM | Admit: 2016-03-10 | Discharge: 2016-03-17 | DRG: 683 | Disposition: A | Discharge status: Other Acute Inpatient Hospital | Payer: Medicare Other | Attending: Internal Medicine | Admitting: Internal Medicine

## 2016-03-10 DIAGNOSIS — N179 Acute kidney failure, unspecified: Principal | ICD-10-CM | POA: Diagnosis present

## 2016-03-10 DIAGNOSIS — R1111 Vomiting without nausea: Secondary | ICD-10-CM | POA: Diagnosis not present

## 2016-03-10 DIAGNOSIS — E876 Hypokalemia: Secondary | ICD-10-CM | POA: Diagnosis present

## 2016-03-10 DIAGNOSIS — N185 Chronic kidney disease, stage 5: Secondary | ICD-10-CM | POA: Diagnosis not present

## 2016-03-10 DIAGNOSIS — I120 Hypertensive chronic kidney disease with stage 5 chronic kidney disease or end stage renal disease: Secondary | ICD-10-CM | POA: Diagnosis present

## 2016-03-10 DIAGNOSIS — E785 Hyperlipidemia, unspecified: Secondary | ICD-10-CM | POA: Diagnosis present

## 2016-03-10 DIAGNOSIS — E1122 Type 2 diabetes mellitus with diabetic chronic kidney disease: Secondary | ICD-10-CM | POA: Diagnosis present

## 2016-03-10 DIAGNOSIS — N400 Enlarged prostate without lower urinary tract symptoms: Secondary | ICD-10-CM

## 2016-03-10 DIAGNOSIS — E039 Hypothyroidism, unspecified: Secondary | ICD-10-CM | POA: Diagnosis present

## 2016-03-10 DIAGNOSIS — D631 Anemia in chronic kidney disease: Secondary | ICD-10-CM | POA: Diagnosis not present

## 2016-03-10 DIAGNOSIS — E1165 Type 2 diabetes mellitus with hyperglycemia: Secondary | ICD-10-CM | POA: Diagnosis present

## 2016-03-10 DIAGNOSIS — K922 Gastrointestinal hemorrhage, unspecified: Secondary | ICD-10-CM | POA: Diagnosis not present

## 2016-03-10 DIAGNOSIS — F72 Severe intellectual disabilities: Secondary | ICD-10-CM | POA: Diagnosis present

## 2016-03-10 DIAGNOSIS — N139 Obstructive and reflux uropathy, unspecified: Secondary | ICD-10-CM | POA: Diagnosis not present

## 2016-03-10 DIAGNOSIS — E870 Hyperosmolality and hypernatremia: Secondary | ICD-10-CM | POA: Diagnosis present

## 2016-03-10 DIAGNOSIS — R338 Other retention of urine: Secondary | ICD-10-CM | POA: Diagnosis present

## 2016-03-10 DIAGNOSIS — N401 Enlarged prostate with lower urinary tract symptoms: Secondary | ICD-10-CM | POA: Diagnosis present

## 2016-03-10 DIAGNOSIS — N1330 Unspecified hydronephrosis: Secondary | ICD-10-CM | POA: Diagnosis present

## 2016-03-10 DIAGNOSIS — N19 Unspecified kidney failure: Secondary | ICD-10-CM | POA: Diagnosis not present

## 2016-03-10 DIAGNOSIS — N186 End stage renal disease: Secondary | ICD-10-CM | POA: Diagnosis not present

## 2016-03-10 DIAGNOSIS — I10 Essential (primary) hypertension: Secondary | ICD-10-CM | POA: Diagnosis present

## 2016-03-10 DIAGNOSIS — D696 Thrombocytopenia, unspecified: Secondary | ICD-10-CM

## 2016-03-10 DIAGNOSIS — E872 Acidosis: Secondary | ICD-10-CM | POA: Diagnosis present

## 2016-03-10 DIAGNOSIS — N189 Chronic kidney disease, unspecified: Secondary | ICD-10-CM | POA: Diagnosis present

## 2016-03-10 DIAGNOSIS — R748 Abnormal levels of other serum enzymes: Secondary | ICD-10-CM | POA: Diagnosis not present

## 2016-03-10 DIAGNOSIS — R112 Nausea with vomiting, unspecified: Secondary | ICD-10-CM | POA: Diagnosis present

## 2016-03-10 DIAGNOSIS — D638 Anemia in other chronic diseases classified elsewhere: Secondary | ICD-10-CM | POA: Diagnosis present

## 2016-03-10 DIAGNOSIS — E1129 Type 2 diabetes mellitus with other diabetic kidney complication: Secondary | ICD-10-CM | POA: Diagnosis not present

## 2016-03-10 DIAGNOSIS — N320 Bladder-neck obstruction: Secondary | ICD-10-CM | POA: Diagnosis not present

## 2016-03-10 LAB — CBC
HCT: 31.7 % — ABNORMAL LOW (ref 39.0–52.0)
Hemoglobin: 10.5 g/dL — ABNORMAL LOW (ref 13.0–17.0)
MCH: 34.4 pg — ABNORMAL HIGH (ref 26.0–34.0)
MCHC: 33.1 g/dL (ref 30.0–36.0)
MCV: 103.9 fL — ABNORMAL HIGH (ref 78.0–100.0)
Platelets: 109 10*3/uL — ABNORMAL LOW (ref 150–400)
RBC: 3.05 MIL/uL — ABNORMAL LOW (ref 4.22–5.81)
RDW: 14 % (ref 11.5–15.5)
WBC: 9.9 10*3/uL (ref 4.0–10.5)

## 2016-03-10 LAB — CBC WITH DIFFERENTIAL/PLATELET
Band Neutrophils: 0 %
Basophils Absolute: 0 10*3/uL (ref 0.0–0.1)
Basophils Relative: 0 %
Blasts: 0 %
Eosinophils Absolute: 0 10*3/uL (ref 0.0–0.7)
Eosinophils Relative: 0 %
HCT: 29.5 % — ABNORMAL LOW (ref 39.0–52.0)
Hemoglobin: 9.7 g/dL — ABNORMAL LOW (ref 13.0–17.0)
Lymphocytes Relative: 9 %
Lymphs Abs: 0.9 10*3/uL (ref 0.7–4.0)
MCH: 33.3 pg (ref 26.0–34.0)
MCHC: 32.9 g/dL (ref 30.0–36.0)
MCV: 101.4 fL — ABNORMAL HIGH (ref 78.0–100.0)
Metamyelocytes Relative: 0 %
Monocytes Absolute: 0.9 10*3/uL (ref 0.1–1.0)
Monocytes Relative: 9 %
Myelocytes: 0 %
Neutro Abs: 8.5 10*3/uL — ABNORMAL HIGH (ref 1.7–7.7)
Neutrophils Relative %: 82 %
Other: 0 %
Platelets: 104 10*3/uL — ABNORMAL LOW (ref 150–400)
Promyelocytes Absolute: 0 %
RBC: 2.91 MIL/uL — ABNORMAL LOW (ref 4.22–5.81)
RDW: 13.6 % (ref 11.5–15.5)
WBC: 10.3 10*3/uL (ref 4.0–10.5)
nRBC: 1 /100 WBC — ABNORMAL HIGH

## 2016-03-10 LAB — BASIC METABOLIC PANEL
Anion gap: 10 (ref 5–15)
BUN: 90 mg/dL — ABNORMAL HIGH (ref 6–20)
CO2: 22 mmol/L (ref 22–32)
Calcium: 8.9 mg/dL (ref 8.9–10.3)
Chloride: 112 mmol/L — ABNORMAL HIGH (ref 101–111)
Creatinine, Ser: 9.41 mg/dL — ABNORMAL HIGH (ref 0.61–1.24)
GFR calc Af Amer: 6 mL/min — ABNORMAL LOW (ref >60)
GFR calc non Af Amer: 5 mL/min — ABNORMAL LOW (ref >60)
Glucose, Bld: 189 mg/dL — ABNORMAL HIGH (ref 65–99)
Potassium: 3.3 mmol/L — ABNORMAL LOW (ref 3.5–5.1)
Sodium: 144 mmol/L (ref 135–145)

## 2016-03-10 LAB — COMPREHENSIVE METABOLIC PANEL
ALT: 13 U/L — ABNORMAL LOW (ref 17–63)
AST: 15 U/L (ref 15–41)
Albumin: 3.6 g/dL (ref 3.5–5.0)
Alkaline Phosphatase: 51 U/L (ref 38–126)
Anion gap: 6 (ref 5–15)
BUN: 86 mg/dL — ABNORMAL HIGH (ref 6–20)
CO2: 24 mmol/L (ref 22–32)
Calcium: 8.5 mg/dL — ABNORMAL LOW (ref 8.9–10.3)
Chloride: 113 mmol/L — ABNORMAL HIGH (ref 101–111)
Creatinine, Ser: 8.93 mg/dL — ABNORMAL HIGH (ref 0.61–1.24)
GFR calc Af Amer: 6 mL/min — ABNORMAL LOW (ref >60)
GFR calc non Af Amer: 5 mL/min — ABNORMAL LOW (ref >60)
Glucose, Bld: 169 mg/dL — ABNORMAL HIGH (ref 65–99)
Potassium: 3.5 mmol/L (ref 3.5–5.1)
Sodium: 143 mmol/L (ref 135–145)
Total Bilirubin: 0.5 mg/dL (ref 0.3–1.2)
Total Protein: 6.6 g/dL (ref 6.5–8.1)

## 2016-03-10 LAB — URINALYSIS, ROUTINE W REFLEX MICROSCOPIC
Glucose, UA: 100 mg/dL — AB
Hgb urine dipstick: NEGATIVE
Ketones, ur: NEGATIVE mg/dL
Leukocytes, UA: NEGATIVE
Nitrite: NEGATIVE
Protein, ur: >300 mg/dL — AB
Specific Gravity, Urine: 1.025 (ref 1.005–1.030)
pH: 5.5 (ref 5.0–8.0)

## 2016-03-10 LAB — LIPASE, BLOOD: Lipase: 116 U/L — ABNORMAL HIGH (ref 11–51)

## 2016-03-10 LAB — GLUCOSE, CAPILLARY
Glucose-Capillary: 187 mg/dL — ABNORMAL HIGH (ref 65–99)
Glucose-Capillary: 205 mg/dL — ABNORMAL HIGH (ref 65–99)
Glucose-Capillary: 139 mg/dL — ABNORMAL HIGH (ref 65–99)

## 2016-03-10 LAB — I-STAT CG4 LACTIC ACID, ED: Lactic Acid, Venous: 0.51 mmol/L (ref 0.5–2.0)

## 2016-03-10 LAB — URINE MICROSCOPIC-ADD ON

## 2016-03-10 MED ORDER — SODIUM CHLORIDE 0.9 % IV SOLN
1000.0000 mL | Freq: Once | INTRAVENOUS | Status: AC
  Administered 2016-03-10: 1000 mL via INTRAVENOUS

## 2016-03-10 MED ORDER — ACETAMINOPHEN 325 MG PO TABS: 650.0000 mg | ORAL_TABLET | Freq: Four times a day (QID) | ORAL | Status: DC | PRN

## 2016-03-10 MED ORDER — SIMVASTATIN 20 MG PO TABS
20.0000 mg | ORAL_TABLET | Freq: Every day | ORAL | Status: DC
  Administered 2016-03-10 – 2016-03-16 (×6): 20 mg via ORAL
  Filled 2016-03-10 (×7): qty 1

## 2016-03-10 MED ORDER — SODIUM CHLORIDE 0.9 % IV SOLN: 250.0000 mL | INTRAVENOUS | Status: DC | PRN

## 2016-03-10 MED ORDER — FERROUS SULFATE 325 (65 FE) MG PO TABS
325.0000 mg | ORAL_TABLET | Freq: Every day | ORAL | Status: DC
  Administered 2016-03-10 – 2016-03-17 (×8): 325 mg via ORAL
  Filled 2016-03-10 (×8): qty 1

## 2016-03-10 MED ORDER — OXYCODONE HCL 5 MG PO TABS
5.0000 mg | ORAL_TABLET | ORAL | Status: DC | PRN
  Administered 2016-03-12 – 2016-03-14 (×2): 5 mg via ORAL
  Filled 2016-03-10 (×2): qty 1

## 2016-03-10 MED ORDER — LEVOTHYROXINE SODIUM 75 MCG PO TABS
75.0000 ug | ORAL_TABLET | Freq: Every day | ORAL | Status: DC
  Administered 2016-03-10 – 2016-03-17 (×8): 75 ug via ORAL
  Filled 2016-03-10 (×8): qty 1

## 2016-03-10 MED ORDER — TAMSULOSIN HCL 0.4 MG PO CAPS
0.4000 mg | ORAL_CAPSULE | Freq: Every day | ORAL | Status: DC
  Administered 2016-03-10 – 2016-03-17 (×9): 0.4 mg via ORAL
  Filled 2016-03-10 (×8): qty 1

## 2016-03-10 MED ORDER — ONDANSETRON HCL 4 MG/2ML IJ SOLN
4.0000 mg | Freq: Four times a day (QID) | INTRAMUSCULAR | Status: DC | PRN
  Administered 2016-03-10 – 2016-03-11 (×2): 4 mg via INTRAVENOUS
  Filled 2016-03-10 (×2): qty 2

## 2016-03-10 MED ORDER — ACETAMINOPHEN 650 MG RE SUPP
650.0000 mg | Freq: Four times a day (QID) | RECTAL | Status: DC | PRN
Start: 2016-03-10 — End: 2016-03-17

## 2016-03-10 MED ORDER — INSULIN ASPART 100 UNIT/ML ~~LOC~~ SOLN
0.0000 [IU] | SUBCUTANEOUS | Status: DC
  Administered 2016-03-10 (×2): 3 [IU] via SUBCUTANEOUS
  Administered 2016-03-10: 1 [IU] via SUBCUTANEOUS
  Administered 2016-03-10: 2 [IU] via SUBCUTANEOUS
  Administered 2016-03-11: 1 [IU] via SUBCUTANEOUS
  Administered 2016-03-11: 2 [IU] via SUBCUTANEOUS
  Administered 2016-03-11: 1 [IU] via SUBCUTANEOUS
  Administered 2016-03-11: 2 [IU] via SUBCUTANEOUS
  Administered 2016-03-11 – 2016-03-12 (×2): 1 [IU] via SUBCUTANEOUS
  Administered 2016-03-12: 2 [IU] via SUBCUTANEOUS
  Administered 2016-03-12 – 2016-03-13 (×3): 1 [IU] via SUBCUTANEOUS
  Administered 2016-03-14: 2 [IU] via SUBCUTANEOUS
  Administered 2016-03-14 – 2016-03-15 (×3): 1 [IU] via SUBCUTANEOUS

## 2016-03-10 MED ORDER — SODIUM CHLORIDE 0.9% FLUSH
3.0000 mL | Freq: Two times a day (BID) | INTRAVENOUS | Status: DC
  Administered 2016-03-12 – 2016-03-17 (×7): 3 mL via INTRAVENOUS

## 2016-03-10 MED ORDER — ONDANSETRON HCL 4 MG/2ML IJ SOLN
4.0000 mg | Freq: Once | INTRAMUSCULAR | Status: AC
  Administered 2016-03-10: 4 mg via INTRAVENOUS
  Filled 2016-03-10: qty 2

## 2016-03-10 MED ORDER — SODIUM CHLORIDE 0.9 % IV SOLN
1000.0000 mL | INTRAVENOUS | Status: DC
  Administered 2016-03-10 – 2016-03-11 (×3): 1000 mL via INTRAVENOUS

## 2016-03-10 MED ORDER — ISOSORB DINITRATE-HYDRALAZINE 20-37.5 MG PO TABS
1.0000 | ORAL_TABLET | Freq: Three times a day (TID) | ORAL | Status: DC
  Administered 2016-03-10 – 2016-03-17 (×21): 1 via ORAL
  Filled 2016-03-10 (×28): qty 1

## 2016-03-10 MED ORDER — CARVEDILOL 3.125 MG PO TABS
6.2500 mg | ORAL_TABLET | Freq: Two times a day (BID) | ORAL | Status: DC
  Administered 2016-03-10 – 2016-03-17 (×15): 6.25 mg via ORAL
  Filled 2016-03-10 (×16): qty 2

## 2016-03-10 MED ORDER — SODIUM CHLORIDE 0.9% FLUSH: 3.0000 mL | INTRAVENOUS | Status: DC | PRN

## 2016-03-10 MED ORDER — ONDANSETRON HCL 4 MG PO TABS
4.0000 mg | ORAL_TABLET | Freq: Four times a day (QID) | ORAL | Status: DC | PRN
  Administered 2016-03-12: 4 mg via ORAL
  Filled 2016-03-10: qty 1

## 2016-03-10 MED ORDER — HYDROMORPHONE HCL 1 MG/ML IJ SOLN
0.5000 mg | INTRAMUSCULAR | Status: DC | PRN
  Filled 2016-03-10: qty 1

## 2016-03-10 MED ORDER — PANTOPRAZOLE SODIUM 40 MG PO TBEC
40.0000 mg | DELAYED_RELEASE_TABLET | Freq: Every day | ORAL | Status: DC
  Administered 2016-03-10 – 2016-03-17 (×9): 40 mg via ORAL
  Filled 2016-03-10 (×8): qty 1

## 2016-03-10 NOTE — Care Management Note (Signed)
Case Management Note  Patient Details  Name: Zachary [name] MRN: [id number] Date of Birth: [birth year]  Subjective/Objective: CSW following for placement.                   Action/Plan: Anticipated discharge to [facility] when stable.   Expected Discharge Date:                  Expected Discharge Plan:  Assisted Living / Rest Home  In-House Referral:     Discharge planning Services  CM Consult  Post Acute Care Choice:    Choice offered to:     DME Arranged:    DME Agency:     HH Arranged:    [place] Agency:     Status of Service:  Completed, signed off  Medicare Important Message Given:  Yes Date Medicare IM Given:    Medicare IM give by:    Date Additional Medicare IM Given:    Additional Medicare Important Message give by:     If discussed at [place] of Stay Meetings, dates discussed:    Additional Comments:  Alvie Heidelberg, RN 03/10/2016, 11:33 AM

## 2016-03-10 NOTE — ED Notes (Signed)
Pt vomiting dark brown emesis x 1 week; pt was seen here for same and sent back to facility

## 2016-03-10 NOTE — H&P (Signed)
[facility] Admission History and Physical       ANDY BIBEAULT [id number] DOB: 04/02/1946 DOA: 03/10/2016  Referring physician: EDP PCP: [name], MD  Specialists:   Chief Complaint: Nausea and Vomiting   HPI: Zachary [name] is a 70 y.o. male with a history of Stage V CKD, HTN, DM2, Hypothyroid, and Mental Retardation who presents to the ED who was sent from his long-term care Center for evalautation due to increased Nausea and Vomiting.    He can not give a history.  His BUN/Cr has increased and is now 86/8.93.  An AV Graft for dialysis had been placed on 02/26/2016 for impending Dialysis treatments.   He was referred for further evaluation and treatment.  Attempts to reach his emergency contact were unsuccessful.      Review of Systems: Unable to Obtain from the Patient  Past Medical History  Diagnosis Date  . Hypertension   . Diabetes mellitus   . Smoking   . Abnormal CT scan, kidney 10/06/2011  . UTI (lower urinary tract infection) 10/06/2011  . Bladder wall thickening 10/06/2011  . 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 Poor historian poor historian  . BPH (benign prostatic hypertrophy)   . Anxiety     mental retardation  . Hypothyroidism   . MR (mental retardation)   . Acute pyelonephritis 10/07/2011  . Hyperkalemia   . Renal insufficiency     chronic history  . Renal failure (ARF), acute on chronic (HCC)   . Anemia     normocytic  . Edema      history of lower extremity edema  . Hyperlipidemia   . Impaired speech      Past Surgical History  Procedure Laterality Date  . Cystoscopy with urethral dilatation N/A 12/29/2013    Procedure: CYSTOSCOPY WITH URETHRAL DILATATION;  Surgeon: Marissa Nestle, MD;  Location: [facility];  Service: Urology;  Laterality: N/A;  . Transurethral resection of prostate N/A 01/04/2014    Procedure: TRANSURETHRAL RESECTION OF THE PROSTATE (TURP) (procedure #2);  Surgeon: Marissa Nestle, MD;  Location: [facility];  Service:  Urology;  Laterality: N/A;  . Circumcision N/A 01/04/2014    Procedure: CIRCUMCISION ADULT (procedure #1);  Surgeon: Marissa Nestle, MD;  Location: [facility];  Service: Urology;  Laterality: N/A;  . Cystoscopy w/ retrogrades Bilateral 06/29/2015    Procedure: CYSTOSCOPY, DILATION OF URETHRAL STRICTURE WITH BILATERAL RETROGRADE PYELOGRAM,SUPRAPUBIC TUBE CHANGE;  Surgeon: Festus Aloe, MD;  Location: [facility];  Service: Urology;  Laterality: Bilateral;  . Av fistula placement Left 07/06/2015    Procedure:  INSERTION LEFT ARM ARTERIOVENOUS GORTEX GRAFT;  Surgeon: Angelia Mould, MD;  Location: [place];  Service: Vascular;  Laterality: Left;  . Peripheral vascular catheterization N/A 10/08/2015    Procedure: A/V Shuntogram;  Surgeon: Angelia Mould, MD;  Location: [place] [facility];  Service: Cardiovascular;  Laterality: N/A;  . Avgg removal Left 10/09/2015    Procedure: REMOVAL OF ARTERIOVENOUS GORETEX GRAFT ([place]) Evacuation of Lymphocele, Vein Patch angioplasty of brachial artery.;  Surgeon: Angelia Mould, MD;  Location: [place];  Service: Vascular;  Laterality: Left;  . Av fistula placement Right 02/26/2016    Procedure: ARTERIOVENOUS (AV) FISTULA CREATION ;  Surgeon: Angelia Mould, MD;  Location: [place];  Service: Vascular;  Laterality: Right;  . Bascilic vein transposition Right 02/26/2016    Procedure: Right BASCILIC VEIN TRANSPOSITION;  Surgeon: Angelia Mould, MD;  Location: [place];  Service: Vascular;  Laterality: Right;  Prior to Admission medications   Medication Sig Start Date End Date Taking? Authorizing Provider  carvedilol (COREG) 6.25 MG tablet Take 1 tablet (6.25 mg total) by mouth 2 (two) times daily with a meal. 03/08/15   [name], MD  ferrous sulfate 325 (65 FE) MG tablet Take 325 mg by mouth daily with breakfast.    Historical Provider, MD  isosorbide-hydrALAZINE (BIDIL) 20-37.5 MG per tablet Take 1 tablet by mouth 3 (three) times daily.  03/08/15   [name], MD  levothyroxine (SYNTHROID, LEVOTHROID) 75 MCG tablet Take 75 mcg by mouth daily before breakfast.     Historical Provider, MD  linagliptin (TRADJENTA) 5 MG TABS tablet Take 5 mg by mouth daily.    Historical Provider, MD  niacin (NIASPAN) 1000 MG CR tablet Take 1,000 mg by mouth at bedtime.    Historical Provider, MD  omeprazole (PRILOSEC) 20 MG capsule Take 40 mg by mouth daily.    Historical Provider, MD  ondansetron (ZOFRAN ODT) 4 MG disintegrating tablet Take 1 tablet (4 mg total) by mouth every 8 (eight) hours as needed. 03/06/16   Fredia Sorrow, MD  ondansetron (ZOFRAN) 4 mg TABS tablet Take 1 tablet by mouth every 8 hours when necessary nausea. 03/02/16   Leonard Schwartz, MD  oxyCODONE-acetaminophen (ROXICET) 5-325 MG tablet Take 1 tablet by mouth every 6 (six) hours as needed. Patient taking differently: Take 1 tablet by mouth every 6 (six) hours as needed for moderate pain.  02/26/16   Alvia Grove, PA-C  simvastatin (ZOCOR) 20 MG tablet Take 20 mg by mouth at bedtime.  02/28/15   Historical Provider, MD  sodium polystyrene (KAYEXALATE) 15 GM/60ML suspension Take 30 g by mouth every Monday, Wednesday, and Friday. Drinks entire content of 1 bottle (30 gm) once daily on Monday/Wednesday/Friday    Historical Provider, MD  tamsulosin (FLOMAX) 0.4 MG CAPS capsule Take 0.4 mg by mouth daily.    Historical Provider, MD  torsemide (DEMADEX) 20 MG tablet Take 40 mg by mouth daily.  06/13/15   Historical Provider, MD  Vitamin D, Ergocalciferol, (DRISDOL) 50000 UNITS CAPS capsule Take 1 capsule (50,000 Units total) by mouth every 7 (seven) days. Patient taking differently: Take 50,000 Units by mouth every Monday.  03/08/15   [name], MD     No Known Allergies    Social History:  reports that he has never smoked. He has never used smokeless tobacco. He reports that he does not drink alcohol or use illicit drugs.     Family History  Problem Relation Age of Onset  .  Cancer Mother        Physical Exam:  GEN:  Pleasant Elderly  70 y.o. African American male examined and in no acute distress; cooperative with exam Filed Vitals:   03/10/16 0022 03/10/16 0311  BP: 131/68 118/63  Pulse: 96 98  Temp: 98.3 F (36.8 C)   TempSrc: Oral   Resp: 20 16  SpO2: 99% 100%   Blood pressure 118/63, pulse 98, temperature 98.3 F (36.8 C), temperature source Oral, resp. rate 16, SpO2 100 %. PSYCH: He is alert and oriented x 2; does not appear anxious does not appear depressed; affect is normal HEENT: Normocephalic and Atraumatic, Mucous membranes pink; PERRLA; EOM intact; Fundi:  Benign;  No scleral icterus, Nares: Patent, Oropharynx: Clear, Sparse Dentition,    Neck:  FROM, No Cervical Lymphadenopathy nor Thyromegaly or Carotid Bruit; No JVD; Breasts:: Not examined CHEST WALL: No tenderness CHEST: Normal respiration, clear to  auscultation bilaterally HEART: Regular rate and rhythm; no murmurs rubs or gallops BACK: No kyphosis or scoliosis; No CVA tenderness ABDOMEN: Positive Bowel Sounds, Soft Non-Tender, No Rebound or Guarding; No Masses, No Organomegaly Rectal Exam: Not done EXTREMITIES: No Cyanosis, Clubbing, or Edema; No Ulcerations. Genitalia: not examined PULSES: 2+ and symmetric SKIN: Normal hydration no rash or ulceration CNS:  Alert and Oriented x 2, No Focal Deficits Vascular: pulses palpable throughout    Labs on Admission:  Basic Metabolic Panel:  Recent Labs Lab 03/06/16 0908 03/10/16 0155  NA 146* 143  K 4.3 3.5  CL 110 113*  CO2 26 24  GLUCOSE 194* 169*  BUN 76* 86*  CREATININE 6.62* 8.93*  CALCIUM 9.4 8.5*   Liver Function Tests:  Recent Labs Lab 03/10/16 0155  AST 15  ALT 13*  ALKPHOS 51  BILITOT 0.5  PROT 6.6  ALBUMIN 3.6    Recent Labs Lab 03/10/16 0155  LIPASE 116*   No results for input(s): AMMONIA in the last 168 hours. CBC:  Recent Labs Lab 03/06/16 0908 03/10/16 0155  WBC 6.8 10.3  NEUTROABS 4.7  8.5*  HGB 9.9* 9.7*  HCT 30.6* 29.5*  MCV 104.1* 101.4*  PLT 127* 104*   Cardiac Enzymes: No results for input(s): CKTOTAL, CKMB, CKMBINDEX, TROPONINI in the last 168 hours.  BNP (last 3 results) No results for input(s): BNP in the last 8760 hours.  ProBNP (last 3 results) No results for input(s): PROBNP in the last 8760 hours.  CBG: No results for input(s): GLUCAP in the last 168 hours.  Radiological Exams on Admission: Ct Abdomen Pelvis Wo Contrast  03/10/2016  CLINICAL DATA:  Right upper quadrant and left upper quadrant abdominal pain with nausea for 1 week. EXAM: CT ABDOMEN AND PELVIS WITHOUT CONTRAST TECHNIQUE: Multidetector CT imaging of the abdomen and pelvis was performed following the standard protocol without IV contrast. COMPARISON:  09/13/2015 FINDINGS: Mild dependent changes in the lung bases. Coronary artery calcifications. Small pericardial effusion. There is bilateral hydronephrosis and hydroureter with stranding around the kidneys. No renal, ureteral, or bladder stones are identified. Bladder wall is diffusely thickened. Prostate gland is enlarged at 5.2 x 5.5 cm in diameter. Changes could be due to cystitis and pyelonephritis or bladder outlet obstruction with bladder wall hypertrophy and reflux. The unenhanced appearance of the liver, spleen, gallbladder, pancreas, adrenal glands, abdominal aorta, inferior vena cava, and retroperitoneal lymph nodes is unremarkable. Stomach, small bowel, and colon are not abnormally distended. Stool fills the colon. No free air or free fluid in the abdomen. Pelvis: Appendix is not identified. No free or loculated pelvic fluid collections. No pelvic lymphadenopathy. Degenerative changes in the spine. No destructive bone lesions. IMPRESSION: Bilateral hydronephrosis and hydroureter. Bladder wall thickening. Prostate gland enlarged. No stones identified. Changes could be due to cystitis and pyelonephritis or bladder outlet obstruction with bladder  wall hypertrophy and reflux. Electronically Signed   By: Lucienne Capers M.D.   On: 03/10/2016 02:50          Assessment/Plan:   70 y.o. male with  Principal Problem:    Uremia   Consult Renal In AM   Hold Torsemide, and Tradjenta Rx   Active Problems:    Nausea & vomiting- due to Uremia   PRN IV Zofran   Evaluate for Dialysis to Treat Etiology      End stage renal disease ([place])   Renal Consult in AM      Hydronephrosis, bilateral   Urology Consult  Essential hypertension   Hold Torsemide   Continue Bidil and Carvedilol Rx   Monitor BPs        DM (diabetes mellitus), type 2, uncontrolled ([place])   Hold Tradjenta Rx due to Acute on Chronic  Renal Failure   SSI Coverage PRN        BPH (benign prostatic hypertrophy) with urinary retention   Continue Tamsulosin Rx   Urology consult     Anemia, chronic disease- and Macrocytosis   Monitor Trend      DVT Prophylaxis   SCDs     Code Status:     FULL CODE     Family Communication:  No Family Present    Disposition Plan:    Inpatient Status        Time spent:  64 Minutes      Theressa Millard [facility] Pager [phone]   If [place] Please Contact the Day Rounding Team MD for [facility]  If 7PM-7AM, Please Contact Night-Floor Coverage  [email or web address] Password [email or web address] 03/10/2016, 3:53 AM     ADDENDUM:   Patient was seen and examined on 03/10/2016

## 2016-03-10 NOTE — Progress Notes (Signed)
Notified Dr. Legrand Rams that the consult would have to be done on Tuesday since that is when the MD will be in.  He verbalized understanding.

## 2016-03-10 NOTE — ED Notes (Signed)
Hospital has seen patient here in ED, aware that patient refused indwelling foley. Patient did urinate 350cc

## 2016-03-10 NOTE — Consult Note (Signed)
Reason for Consult: Acute kidney injury superimposed on chronic Referring Physician: [name] is an 70 y.o. male.  HPI: He is a patient with history of hypertension, diabetes, history of obstructive uropathy, chronic renal failure stage IV presently patient is sent to the emergency room because of nausea, vomiting, poor appetite. When patient was evaluated here and creatinine has significantly increased from his baseline. CT scan of the abdomen showed bilateral hydronephrosis and also hydroureter. Presently urology is consulted. Because of his history of mental retardation very difficult to get additional history. Patient has this moment seems to be denying any difficulty breathing.  Past Medical History  Diagnosis Date  . Hypertension   . Diabetes mellitus   . Smoking   . Abnormal CT scan, kidney 10/06/2011  . UTI (lower urinary tract infection) 10/06/2011  . Bladder wall thickening 10/06/2011  . 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 Poor historian poor historian  . BPH (benign prostatic hypertrophy)   . Anxiety     mental retardation  . Hypothyroidism   . MR (mental retardation)   . Acute pyelonephritis 10/07/2011  . Hyperkalemia   . Renal insufficiency     chronic history  . Renal failure (ARF), acute on chronic (HCC)   . Anemia     normocytic  . Edema      history of lower extremity edema  . Hyperlipidemia   . Impaired speech     Past Surgical History  Procedure Laterality Date  . Cystoscopy with urethral dilatation N/A 12/29/2013    Procedure: CYSTOSCOPY WITH URETHRAL DILATATION;  Surgeon: Marissa Nestle, MD;  Location: [facility];  Service: Urology;  Laterality: N/A;  . Transurethral resection of prostate N/A 01/04/2014    Procedure: TRANSURETHRAL RESECTION OF THE PROSTATE (TURP) (procedure #2);  Surgeon: Marissa Nestle, MD;  Location: [facility];  Service: Urology;  Laterality: N/A;  . Circumcision N/A 01/04/2014    Procedure: CIRCUMCISION ADULT (procedure #1);   Surgeon: Marissa Nestle, MD;  Location: [facility];  Service: Urology;  Laterality: N/A;  . Cystoscopy w/ retrogrades Bilateral 06/29/2015    Procedure: CYSTOSCOPY, DILATION OF URETHRAL STRICTURE WITH BILATERAL RETROGRADE PYELOGRAM,SUPRAPUBIC TUBE CHANGE;  Surgeon: Festus Aloe, MD;  Location: [facility];  Service: Urology;  Laterality: Bilateral;  . Av fistula placement Left 07/06/2015    Procedure:  INSERTION LEFT ARM ARTERIOVENOUS GORTEX GRAFT;  Surgeon: Angelia Mould, MD;  Location: [place];  Service: Vascular;  Laterality: Left;  . Peripheral vascular catheterization N/A 10/08/2015    Procedure: A/V Shuntogram;  Surgeon: Angelia Mould, MD;  Location: [place] [facility];  Service: Cardiovascular;  Laterality: N/A;  . Avgg removal Left 10/09/2015    Procedure: REMOVAL OF ARTERIOVENOUS GORETEX GRAFT ([place]) Evacuation of Lymphocele, Vein Patch angioplasty of brachial artery.;  Surgeon: Angelia Mould, MD;  Location: [place];  Service: Vascular;  Laterality: Left;  . Av fistula placement Right 02/26/2016    Procedure: ARTERIOVENOUS (AV) FISTULA CREATION ;  Surgeon: Angelia Mould, MD;  Location: [place];  Service: Vascular;  Laterality: Right;  . Bascilic vein transposition Right 02/26/2016    Procedure: Right [place];  Surgeon: Angelia Mould, MD;  Location: [facility];  Service: Vascular;  Laterality: Right;    Family History  Problem Relation Age of Onset  . Cancer Mother     Social History:  reports that he has never smoked. He has never used smokeless tobacco. He reports that he does not drink  alcohol or use illicit drugs.  Allergies: No Known Allergies  Medications: I have reviewed the patient's current medications.  Results for orders placed or performed during the hospital encounter of 03/10/16 (from the past 48 hour(s))  Comprehensive metabolic panel     Status: Abnormal   Collection Time: 03/10/16  1:55 AM  Result Value Ref Range   Sodium  143 135 - 145 mmol/L   Potassium 3.5 3.5 - 5.1 mmol/L   Chloride 113 (H) 101 - 111 mmol/L   CO2 24 22 - 32 mmol/L   Glucose, Bld 169 (H) 65 - 99 mg/dL   BUN 86 (H) 6 - 20 mg/dL   Creatinine, Ser 8.93 (H) 0.61 - 1.24 mg/dL   Calcium 8.5 (L) 8.9 - 10.3 mg/dL   Total Protein 6.6 6.5 - 8.1 g/dL   Albumin 3.6 3.5 - 5.0 g/dL   AST 15 15 - 41 U/L   ALT 13 (L) 17 - 63 U/L   Alkaline Phosphatase 51 38 - 126 U/L   Total Bilirubin 0.5 0.3 - 1.2 mg/dL   GFR calc non Af Amer 5 (L) >60 mL/min   GFR calc Af Amer 6 (L) >60 mL/min    Comment: (NOTE) The eGFR has been calculated using the CKD EPI equation. This calculation has not been validated in all clinical situations. eGFR's persistently <60 mL/min signify possible Chronic Kidney Disease.    Anion gap 6 5 - 15  Lipase, blood     Status: Abnormal   Collection Time: 03/10/16  1:55 AM  Result Value Ref Range   Lipase 116 (H) 11 - 51 U/L  CBC with Differential     Status: Abnormal   Collection Time: 03/10/16  1:55 AM  Result Value Ref Range   WBC 10.3 4.0 - 10.5 K/uL   RBC 2.91 (L) 4.22 - 5.81 MIL/uL   Hemoglobin 9.7 (L) 13.0 - 17.0 g/dL   HCT 29.5 (L) 39.0 - 52.0 %   MCV 101.4 (H) 78.0 - 100.0 fL   MCH 33.3 26.0 - 34.0 pg   MCHC 32.9 30.0 - 36.0 g/dL   RDW 13.6 11.5 - 15.5 %   Platelets 104 (L) 150 - 400 K/uL    Comment: SPECIMEN CHECKED FOR CLOTS   Neutrophils Relative % 82 %   Lymphocytes Relative 9 %   Monocytes Relative 9 %   Eosinophils Relative 0 %   Basophils Relative 0 %   Band Neutrophils 0 %   Metamyelocytes Relative 0 %   Myelocytes 0 %   Promyelocytes Absolute 0 %   Blasts 0 %   nRBC 1 (H) 0 /100 WBC   Other 0 %   Neutro Abs 8.5 (H) 1.7 - 7.7 K/uL   Lymphs Abs 0.9 0.7 - 4.0 K/uL   Monocytes Absolute 0.9 0.1 - 1.0 K/uL   Eosinophils Absolute 0.0 0.0 - 0.7 K/uL   Basophils Absolute 0.0 0.0 - 0.1 K/uL  I-Stat CG4 Lactic Acid, ED     Status: None   Collection Time: 03/10/16  2:01 AM  Result Value Ref Range   Lactic  Acid, Venous 0.51 0.5 - 2.0 mmol/L  Urinalysis, Routine w reflex microscopic     Status: Abnormal   Collection Time: 03/10/16  4:05 AM  Result Value Ref Range   Color, Urine YELLOW YELLOW   APPearance CLEAR CLEAR   Specific Gravity, Urine 1.025 1.005 - 1.030   pH 5.5 5.0 - 8.0   Glucose, UA 100 (A) NEGATIVE  mg/dL   Hgb urine dipstick NEGATIVE NEGATIVE   Bilirubin Urine SMALL (A) NEGATIVE   Ketones, ur NEGATIVE NEGATIVE mg/dL   Protein, ur >300 (A) NEGATIVE mg/dL   Nitrite NEGATIVE NEGATIVE   Leukocytes, UA NEGATIVE NEGATIVE  Urine microscopic-add on     Status: Abnormal   Collection Time: 03/10/16  4:05 AM  Result Value Ref Range   Squamous Epithelial / LPF 0-5 (A) NONE SEEN   WBC, UA 0-5 0 - 5 WBC/hpf   RBC / HPF 0-5 0 - 5 RBC/hpf   Bacteria, UA FEW (A) NONE SEEN  Glucose, capillary     Status: Abnormal   Collection Time: 03/10/16  7:41 AM  Result Value Ref Range   Glucose-Capillary 187 (H) 65 - 99 mg/dL    Ct Abdomen Pelvis Wo Contrast  03/10/2016  CLINICAL DATA:  Right upper quadrant and left upper quadrant abdominal pain with nausea for 1 week. EXAM: CT ABDOMEN AND PELVIS WITHOUT CONTRAST TECHNIQUE: Multidetector CT imaging of the abdomen and pelvis was performed following the standard protocol without IV contrast. COMPARISON:  09/13/2015 FINDINGS: Mild dependent changes in the lung bases. Coronary artery calcifications. Small pericardial effusion. There is bilateral hydronephrosis and hydroureter with stranding around the kidneys. No renal, ureteral, or bladder stones are identified. Bladder wall is diffusely thickened. Prostate gland is enlarged at 5.2 x 5.5 cm in diameter. Changes could be due to cystitis and pyelonephritis or bladder outlet obstruction with bladder wall hypertrophy and reflux. The unenhanced appearance of the liver, spleen, gallbladder, pancreas, adrenal glands, abdominal aorta, inferior vena cava, and retroperitoneal lymph nodes is unremarkable. Stomach, small  bowel, and colon are not abnormally distended. Stool fills the colon. No free air or free fluid in the abdomen. Pelvis: Appendix is not identified. No free or loculated pelvic fluid collections. No pelvic lymphadenopathy. Degenerative changes in the spine. No destructive bone lesions. IMPRESSION: Bilateral hydronephrosis and hydroureter. Bladder wall thickening. Prostate gland enlarged. No stones identified. Changes could be due to cystitis and pyelonephritis or bladder outlet obstruction with bladder wall hypertrophy and reflux. Electronically Signed   By: Lucienne Capers M.D.   On: 03/10/2016 02:50    Review of Systems  Constitutional: Negative for fever and chills.  Respiratory: Negative for shortness of breath.   Gastrointestinal: Positive for nausea and vomiting.  Neurological: Positive for weakness.   Blood pressure 130/66, pulse 105, temperature 98 F (36.7 C), temperature source Oral, resp. rate 16, weight 79.3 kg (174 lb 13.2 oz), SpO2 99 %. Physical Exam  Constitutional: No distress.  Eyes: No scleral icterus.  Neck: No JVD present.  Cardiovascular: Normal rate and regular rhythm.   Respiratory: No respiratory distress. He has no wheezes.  GI: He exhibits no distension. There is no tenderness.  Musculoskeletal: He exhibits no edema.  Neurological: He is alert.    Assessment/Plan: Problem #1 acute kidney injury superimposed on chronic: Possibly obstructive. Patient with enlarged prostate. Patient with sinus symptoms of uremia. Problem #2 chronic renal failure thought to be secondary to focal segmental nephrosclerosis/diabetes/hypertension. Patient with right upper arm graft placement. Problem #3 anemia: His hemoglobin and hematocrit is slightly below her target goal. Problem #4 diabetes Problem #5 hypertension: His blood pressure is reasonably controlled Problem #6 metabolic bone disease: His calcium is range. Problem #7 mental chart dictation. Plan: 1] Urology consult is  pending. Possibly placement of Foley catheter may be helpful. 2] If his renal function does not improve after decompression of his bladder patient may require dialysis.  3] will check renal panel in the morning.   Valdemar Mcclenahan S 03/10/2016, 8:21 AM

## 2016-03-10 NOTE — ED Notes (Signed)
In to to insert foley, explained to patient. Patient refused stated he would pee in urinal, informed physician wanted an indwelling, and he was going to need admission to the hospital. Patient still refused. Urinal given. Will inform provider.

## 2016-03-10 NOTE — NC FL2 (Deleted)
[place] LEVEL OF CARE SCREENING TOOL     IDENTIFICATION  Patient Name: Zachary [name] Birthdate: 19-Dec-1945 Sex: male Admission Date (Current Location): 03/10/2016  [facility] and [place] Number:  [employer] and Address:  [place] [address] [place]      Provider Number: [id number]  Attending Physician Name and Address:  [name], MD  Relative Name and Phone Number:       Current Level of Care: Hospital Recommended Level of Care: [place] Prior Approval Number:    Date Approved/Denied:   PASRR Number:    Discharge Plan: Other (Comment) ([facility])    Current Diagnoses: Patient Active Problem List   Diagnosis Date Noted  . Uremia 03/10/2016  . Hydronephrosis, bilateral 03/10/2016  . Acute on chronic renal failure ([place]) 03/10/2016  . End stage renal disease ([place]) 10/08/2015  . 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 Left arm pain 09/27/2015  . Cold sensation of skin-Left Hand 09/27/2015  . Left arm swelling 09/27/2015  . Pyelonephritis 09/13/2015  . UTI (lower urinary tract infection) 07/20/2015  . Heme positive stool   . Absolute anemia   . Abnormal CT scan, esophagus   . Protein-calorie malnutrition, severe ([place]) 07/10/2015  . Acute renal failure superimposed on stage 4 chronic kidney disease ([place]) 07/08/2015  . Nausea and vomiting 07/08/2015  . Essential hypertension 07/08/2015  . Hypernatremia 07/08/2015  . Hyperlipidemia 07/08/2015  . Nausea & vomiting 07/08/2015  . Hyperkalemia 05/22/2015  . Obstructive uropathy 04/17/2015  . ARF (acute renal failure) ([place]) 04/17/2015  . Bladder neck contracture   . Urinary retention   . Anemia, chronic disease 04/16/2015  . BPH (benign prostatic hypertrophy) with urinary retention 03/03/2015  . BPH (benign prostatic hypertrophy) with urinary obstruction 01/04/2014  . BPH (benign prostatic hypertrophy) 12/29/2013  . Acute  renal failure ([place]) 12/25/2013  . Renal insufficiency 12/25/2013  . Dehydration 12/25/2013  . Mental retardation 10/10/2011  . Acute pyelonephritis 10/07/2011  . 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 Bladder wall thickening 10/06/2011  . DM (diabetes mellitus), type 2, uncontrolled ([place]) 10/02/2011  . Iron deficiency anemia 10/02/2011  . Bacteremia 10/02/2011  . FX CLOSED FIBULA NOS 02/10/2008    Orientation RESPIRATION BLADDER Height & Weight     Self, Situation (MR)  Normal Continent Weight: 174 lb 13.2 oz (79.3 kg) Height:   (5'9")  BEHAVIORAL SYMPTOMS/MOOD NEUROLOGICAL BOWEL NUTRITION STATUS      Continent Diet (Diabetic/normal)  AMBULATORY STATUS COMMUNICATION OF NEEDS Skin   Supervision Verbally Other (Comment) (Right arm fiscula)                       Personal Care Assistance Level of Assistance  Bathing, Feeding, Dressing Bathing Assistance: Maximum assistance Feeding assistance: Limited assistance Dressing Assistance: Limited assistance     Functional Limitations Info  Sight, Hearing, Speech Sight Info: Adequate Hearing Info: Adequate Speech Info: Adequate    SPECIAL CARE FACTORS FREQUENCY                       Contractures      Additional Factors Info  Code Status Code Status Info: Full code             Current Medications (03/10/2016):  This is the current hospital active medication list Current Facility-Administered Medications  Medication Dose Route Frequency Provider Last Rate Last Dose  . 0.9 %  sodium  chloride infusion  1,000 mL Intravenous Continuous Delora Fuel, MD 0000000 mL/hr at 03/10/16 0640 1,000 mL at 03/10/16 0640  . 0.9 %  sodium chloride infusion  250 mL Intravenous PRN Theressa Millard, MD      . acetaminophen (TYLENOL) tablet 650 mg  650 mg Oral Q6H PRN Theressa Millard, MD       Or  . acetaminophen (TYLENOL) suppository 650 mg  650 mg Rectal Q6H PRN Theressa Millard, MD      . carvedilol (COREG) tablet 6.25 mg  6.25 mg  Oral BID WC Harvette Evonnie Dawes, MD      . ferrous sulfate tablet 325 mg  325 mg Oral Q breakfast Theressa Millard, MD      . HYDROmorphone (DILAUDID) injection 0.5-1 mg  0.5-1 mg Intravenous Q3H PRN Theressa Millard, MD   1 mg at 03/10/16 0650  . insulin aspart (novoLOG) injection 0-9 Units  0-9 Units Subcutaneous Q4H Harvette C Jenkins, MD      . isosorbide-hydrALAZINE (BIDIL) 20-37.5 MG per tablet 1 tablet  1 tablet Oral TID Theressa Millard, MD      . levothyroxine (SYNTHROID, LEVOTHROID) tablet 75 mcg  75 mcg Oral QAC breakfast Harvette Evonnie Dawes, MD      . ondansetron [facility]) tablet 4 mg  4 mg Oral Q6H PRN Theressa Millard, MD       Or  . ondansetron (ZOFRAN) injection 4 mg  4 mg Intravenous Q6H PRN Harvette Evonnie Dawes, MD      . oxyCODONE (Oxy IR/ROXICODONE) immediate release tablet 5 mg  5 mg Oral Q4H PRN Theressa Millard, MD      . pantoprazole (PROTONIX) EC tablet 40 mg  40 mg Oral Daily Harvette Evonnie Dawes, MD      . simvastatin (ZOCOR) tablet 20 mg  20 mg Oral QHS Harvette Evonnie Dawes, MD      . sodium chloride flush (NS) 0.9 % injection 3 mL  3 mL Intravenous Q12H Harvette Evonnie Dawes, MD      . sodium chloride flush (NS) 0.9 % injection 3 mL  3 mL Intravenous PRN Theressa Millard, MD      . tamsulosin (FLOMAX) capsule 0.4 mg  0.4 mg Oral Daily Theressa Millard, MD         Discharge Medications: Please see discharge summary for a list of discharge medications.  Relevant Imaging Results:  Relevant Lab Results:   Additional Information SSN [id number]  Joana Reamer, [place]

## 2016-03-10 NOTE — Progress Notes (Signed)
LCSW called and spoke with patients guardian Despina [place] [employer] [phone] and she provided a good history.  LCSW called [name] at [facility] [phone] and collected data to complete assessment and Fl2. Patient is able to return to facility upon discharge.  Aparna Vanderweele LCSW

## 2016-03-10 NOTE — Care Management Important Message (Signed)
Important Message  Patient Details  Name: Zachary [name] MRN: [id number] Date of Birth: 18-Oct-1946   Medicare Important Message Given:  Yes    Alvie Heidelberg, RN 03/10/2016, 9:11 AM

## 2016-03-10 NOTE — ED Notes (Signed)
Report given to [facility], on Dept [address] all questions answered.

## 2016-03-10 NOTE — Clinical Social Work Note (Signed)
Clinical Social Work Assessment  Patient Details  Name: Zachary [name] MRN: [id number] Date of Birth: 10-08-1946  Date of referral:  03/10/16               Reason for consult:  Discharge Planning                Permission sought to share information with:  Family Supports, [place], Customer service manager Permission granted to share information::  Yes, Verbal Permission Granted  Name::     [name] [phone] Allowed to speak to family/facility  Agency::  [facility]  Relationship::  yes  Contact Information:  yes  Housing/Transportation Living arrangements for the past 2 months:  [place] of Information:  Facility, Guardian Patient Interpreter Needed:  None Criminal Activity/Legal Involvement Pertinent to Current Situation/Hospitalization:  No - Comment as needed Significant Relationships:  Siblings Lives with:  Facility Resident Do you feel safe going back to the place where you live?  Yes Need for family participation in patient care:  No (Coment)  Care giving concerns:  Guardian and facility admissions person reports patient has not been eating or drinking or able to keep his medication down   Facilities manager / plan: LCSW called patients guardian [name] [employer] [place] . Patient has had a fissula placed in his right arm on May 2nd and since then has not eaten or drank very much and has frequent bouts of nausea. Patient is oriented x1 and is MR. He has end stage KD and diabetic, he takes pill form medication to regulate diabetes.He requires minimal assist with feeding and full assist bathing/dressing. He is able to walk on his own and requires minimal assist with washroom ( [place]). Patient has medicaid and medicare and is able to return to [facility]. Verbal consent given by guardian to speak to family and facility if required.  Employment status:  Disabled (Comment on  whether or not currently receiving Disability) Insurance information:  Medicare, Medicaid In [place] PT Recommendations:  Not assessed at this time Information / Referral to community resources:   none required  Patient/Family's Response to care:  Concerned about frequent nausea patient not eating or drinking since may 2nd surgury  Patient/Family's Understanding of and Emotional Response to Diagnosis, Current Treatment, and Prognosis:  Understand he will be seen  Emotional Assessment Appearance:  Appears stated age Attitude/Demeanor/Rapport:  Calm Affect (typically observed):  Unable to Assess Orientation:  Oriented to Self, Oriented to Place (Patient is MR) Alcohol / Substance use:  Never Used Psych involvement (Current and /or in the community):  No (Comment)  Discharge Needs  Concerns to be addressed:  Adjustment to Illness Readmission within the last 30 days:  Yes Current discharge risk:  Cognitively Impaired, Chronically ill Barriers to Discharge:  No Barriers Identified   Joana Reamer, LCSW 03/10/2016, 8:48 AM

## 2016-03-10 NOTE — Progress Notes (Signed)
Subjective: Patient was admitted yesterday due to nausea or vomiting. His renal function has deteriorated. His creatinine is 8.9. Patient has dialysis access but not started on dialysis. He has also history of obstructive uropathy.  Objective: Vital signs in last 24 hours: Temp:  [98 F (36.7 C)-98.3 F (36.8 C)] 98 F (36.7 C) (05/15 0617) Pulse Rate:  [96-105] 105 (05/15 0617) Resp:  [16-20] 16 (05/15 0617) BP: (118-131)/(63-68) 130/66 mmHg (05/15 0617) SpO2:  [99 %-100 %] 99 % (05/15 0617) Weight:  [79.3 kg (174 lb 13.2 oz)] 79.3 kg (174 lb 13.2 oz) (05/15 0617) Weight change:     Intake/Output from previous day:    PHYSICAL EXAM General appearance: alert and no distress Resp: diminished breath sounds bilaterally and rhonchi bilaterally Cardio: S1, S2 normal GI: soft, non-tender; bowel sounds normal; no masses,  no organomegaly Extremities: extremities normal, atraumatic, no cyanosis or edema  Lab Results:  Results for orders placed or performed during the hospital encounter of 03/10/16 (from the past 48 hour(s))  Comprehensive metabolic panel     Status: Abnormal   Collection Time: 03/10/16  1:55 AM  Result Value Ref Range   Sodium 143 135 - 145 mmol/L   Potassium 3.5 3.5 - 5.1 mmol/L   Chloride 113 (H) 101 - 111 mmol/L   CO2 24 22 - 32 mmol/L   Glucose, Bld 169 (H) 65 - 99 mg/dL   BUN 86 (H) 6 - 20 mg/dL   Creatinine, Ser 8.93 (H) 0.61 - 1.24 mg/dL   Calcium 8.5 (L) 8.9 - 10.3 mg/dL   Total Protein 6.6 6.5 - 8.1 g/dL   Albumin 3.6 3.5 - 5.0 g/dL   AST 15 15 - 41 U/L   ALT 13 (L) 17 - 63 U/L   Alkaline Phosphatase 51 38 - 126 U/L   Total Bilirubin 0.5 0.3 - 1.2 mg/dL   GFR calc non Af Amer 5 (L) >60 mL/min   GFR calc Af Amer 6 (L) >60 mL/min    Comment: (NOTE) The eGFR has been calculated using the CKD EPI equation. This calculation has not been validated in all clinical situations. eGFR's persistently <60 mL/min signify possible Chronic Kidney Disease.     Anion gap 6 5 - 15  Lipase, blood     Status: Abnormal   Collection Time: 03/10/16  1:55 AM  Result Value Ref Range   Lipase 116 (H) 11 - 51 U/L  CBC with Differential     Status: Abnormal   Collection Time: 03/10/16  1:55 AM  Result Value Ref Range   WBC 10.3 4.0 - 10.5 K/uL   RBC 2.91 (L) 4.22 - 5.81 MIL/uL   Hemoglobin 9.7 (L) 13.0 - 17.0 g/dL   HCT 29.5 (L) 39.0 - 52.0 %   MCV 101.4 (H) 78.0 - 100.0 fL   MCH 33.3 26.0 - 34.0 pg   MCHC 32.9 30.0 - 36.0 g/dL   RDW 13.6 11.5 - 15.5 %   Platelets 104 (L) 150 - 400 K/uL    Comment: SPECIMEN CHECKED FOR CLOTS   Neutrophils Relative % 82 %   Lymphocytes Relative 9 %   Monocytes Relative 9 %   Eosinophils Relative 0 %   Basophils Relative 0 %   Band Neutrophils 0 %   Metamyelocytes Relative 0 %   Myelocytes 0 %   Promyelocytes Absolute 0 %   Blasts 0 %   nRBC 1 (H) 0 /100 WBC   Other 0 %   Neutro  Abs 8.5 (H) 1.7 - 7.7 K/uL   Lymphs Abs 0.9 0.7 - 4.0 K/uL   Monocytes Absolute 0.9 0.1 - 1.0 K/uL   Eosinophils Absolute 0.0 0.0 - 0.7 K/uL   Basophils Absolute 0.0 0.0 - 0.1 K/uL  I-Stat CG4 Lactic Acid, ED     Status: None   Collection Time: 03/10/16  2:01 AM  Result Value Ref Range   Lactic Acid, Venous 0.51 0.5 - 2.0 mmol/L  Urinalysis, Routine w reflex microscopic     Status: Abnormal   Collection Time: 03/10/16  4:05 AM  Result Value Ref Range   Color, Urine YELLOW YELLOW   APPearance CLEAR CLEAR   Specific Gravity, Urine 1.025 1.005 - 1.030   pH 5.5 5.0 - 8.0   Glucose, UA 100 (A) NEGATIVE mg/dL   Hgb urine dipstick NEGATIVE NEGATIVE   Bilirubin Urine SMALL (A) NEGATIVE   Ketones, ur NEGATIVE NEGATIVE mg/dL   Protein, ur >300 (A) NEGATIVE mg/dL   Nitrite NEGATIVE NEGATIVE   Leukocytes, UA NEGATIVE NEGATIVE  Urine microscopic-add on     Status: Abnormal   Collection Time: 03/10/16  4:05 AM  Result Value Ref Range   Squamous Epithelial / LPF 0-5 (A) NONE SEEN   WBC, UA 0-5 0 - 5 WBC/hpf   RBC / HPF 0-5 0 - 5  RBC/hpf   Bacteria, UA FEW (A) NONE SEEN  Glucose, capillary     Status: Abnormal   Collection Time: 03/10/16  7:41 AM  Result Value Ref Range   Glucose-Capillary 187 (H) 65 - 99 mg/dL    ABGS No results for input(s): PHART, PO2ART, TCO2, HCO3 in the last 72 hours.  Invalid input(s): PCO2 CULTURES No results found for this or any previous visit (from the past 240 hour(s)). Studies/Results: Ct Abdomen Pelvis Wo Contrast  03/10/2016  CLINICAL DATA:  Right upper quadrant and left upper quadrant abdominal pain with nausea for 1 week. EXAM: CT ABDOMEN AND PELVIS WITHOUT CONTRAST TECHNIQUE: Multidetector CT imaging of the abdomen and pelvis was performed following the standard protocol without IV contrast. COMPARISON:  09/13/2015 FINDINGS: Mild dependent changes in the lung bases. Coronary artery calcifications. Small pericardial effusion. There is bilateral hydronephrosis and hydroureter with stranding around the kidneys. No renal, ureteral, or bladder stones are identified. Bladder wall is diffusely thickened. Prostate gland is enlarged at 5.2 x 5.5 cm in diameter. Changes could be due to cystitis and pyelonephritis or bladder outlet obstruction with bladder wall hypertrophy and reflux. The unenhanced appearance of the liver, spleen, gallbladder, pancreas, adrenal glands, abdominal aorta, inferior vena cava, and retroperitoneal lymph nodes is unremarkable. Stomach, small bowel, and colon are not abnormally distended. Stool fills the colon. No free air or free fluid in the abdomen. Pelvis: Appendix is not identified. No free or loculated pelvic fluid collections. No pelvic lymphadenopathy. Degenerative changes in the spine. No destructive bone lesions. IMPRESSION: Bilateral hydronephrosis and hydroureter. Bladder wall thickening. Prostate gland enlarged. No stones identified. Changes could be due to cystitis and pyelonephritis or bladder outlet obstruction with bladder wall hypertrophy and reflux.  Electronically Signed   By: Lucienne Capers M.D.   On: 03/10/2016 02:50    Medications: I have reviewed the patient's current medications.  Assesment:   Principal Problem:   Uremia Active Problems:   DM (diabetes mellitus), type 2, uncontrolled (HCC)   BPH (benign prostatic hypertrophy) with urinary retention   Anemia, chronic disease   Essential hypertension   Nausea & vomiting   End stage  renal disease ([place])   Hydronephrosis, bilateral   Acute on chronic renal failure ([place])    Plan:  Medications reviewed Discussed with nephrology about the patient condition Agree with urology and nephrology consult Will continue to monior cbc/bmp    LOS: 0 days   Eulogio Requena 03/10/2016, 8:10 AM

## 2016-03-10 NOTE — ED Provider Notes (Signed)
CSN: [id number]     Arrival date & time 03/10/16  0011 History  By signing my name below, I, Nicole Kindred, attest that this documentation has been prepared under the direction and in the presence of Delora Fuel, MD.   Electronically Signed: Nicole Kindred, ED Scribe. 03/10/2016. 12:37 AM   Chief Complaint  Patient presents with  . Emesis    The history is provided by the patient. The history is limited by the condition of the patient. No language interpreter was used.  LEVEL 5 CAVEAT: Mental Retardation  HPI Comments: Zachary [name] is a 70 y.o. male who presents to the Emergency Department complaining of gradual onset, emesis, ongoing for about one week. Pt reports associated upper abdominal pain. Pt was seen in the hospital No other associated symptoms noted. No worsening or alleviating factors noted. Pt denies any other pertinent symptoms.   Past Medical History  Diagnosis Date  . Hypertension   . Diabetes mellitus   . Smoking   . Abnormal CT scan, kidney 10/06/2011  . UTI (lower urinary tract infection) 10/06/2011  . Bladder wall thickening 10/06/2011  . 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 Poor historian poor historian  . BPH (benign prostatic hypertrophy)   . Anxiety     mental retardation  . Hypothyroidism   . MR (mental retardation)   . Acute pyelonephritis 10/07/2011  . Hyperkalemia   . Renal insufficiency     chronic history  . Renal failure (ARF), acute on chronic (HCC)   . Anemia     normocytic  . Edema      history of lower extremity edema  . Hyperlipidemia   . Impaired speech    Past Surgical History  Procedure Laterality Date  . Cystoscopy with urethral dilatation N/A 12/29/2013    Procedure: CYSTOSCOPY WITH URETHRAL DILATATION;  Surgeon: Marissa Nestle, MD;  Location: [facility];  Service: Urology;  Laterality: N/A;  . Transurethral resection of prostate N/A 01/04/2014    Procedure: TRANSURETHRAL RESECTION OF THE PROSTATE (TURP) (procedure #2);  Surgeon:  Marissa Nestle, MD;  Location: [facility];  Service: Urology;  Laterality: N/A;  . Circumcision N/A 01/04/2014    Procedure: CIRCUMCISION ADULT (procedure #1);  Surgeon: Marissa Nestle, MD;  Location: [facility];  Service: Urology;  Laterality: N/A;  . Cystoscopy w/ retrogrades Bilateral 06/29/2015    Procedure: CYSTOSCOPY, DILATION OF URETHRAL STRICTURE WITH BILATERAL RETROGRADE PYELOGRAM,SUPRAPUBIC TUBE CHANGE;  Surgeon: Festus Aloe, MD;  Location: [facility];  Service: Urology;  Laterality: Bilateral;  . Av fistula placement Left 07/06/2015    Procedure:  INSERTION LEFT ARM ARTERIOVENOUS GORTEX GRAFT;  Surgeon: Angelia Mould, MD;  Location: [facility];  Service: Vascular;  Laterality: Left;  . Peripheral vascular catheterization N/A 10/08/2015    Procedure: A/V Shuntogram;  Surgeon: Angelia Mould, MD;  Location: [facility];  Service: Cardiovascular;  Laterality: N/A;  . Avgg removal Left 10/09/2015    Procedure: REMOVAL OF ARTERIOVENOUS GORETEX GRAFT ([place]) Evacuation of Lymphocele, Vein Patch angioplasty of brachial artery.;  Surgeon: Angelia Mould, MD;  Location: [facility];  Service: Vascular;  Laterality: Left;  . Av fistula placement Right 02/26/2016    Procedure: ARTERIOVENOUS (AV) FISTULA CREATION ;  Surgeon: Angelia Mould, MD;  Location: [facility];  Service: Vascular;  Laterality: Right;  . Bascilic vein transposition Right 02/26/2016    Procedure: Right BASCILIC VEIN TRANSPOSITION;  Surgeon: Angelia Mould, MD;  Location: [facility];  Service: Vascular;  Laterality:  Right;   Family History  Problem Relation Age of Onset  . Cancer Mother    Social History  Substance Use Topics  . Smoking status: Never Smoker   . Smokeless tobacco: Never Used  . Alcohol Use: No     Comment: occ. use     Review of Systems  Unable to perform ROS: Other    Allergies  Review of patient's allergies indicates no known allergies.  Home Medications   Prior to Admission  medications   Medication Sig Start Date End Date Taking? Authorizing Provider  carvedilol (COREG) 6.25 MG tablet Take 1 tablet (6.25 mg total) by mouth 2 (two) times daily with a meal. 03/08/15   [name], MD  ferrous sulfate 325 (65 FE) MG tablet Take 325 mg by mouth daily with breakfast.    Historical Provider, MD  isosorbide-hydrALAZINE (BIDIL) 20-37.5 MG per tablet Take 1 tablet by mouth 3 (three) times daily. 03/08/15   [name], MD  levothyroxine (SYNTHROID, LEVOTHROID) 75 MCG tablet Take 75 mcg by mouth daily before breakfast.     Historical Provider, MD  linagliptin (TRADJENTA) 5 MG TABS tablet Take 5 mg by mouth daily.    Historical Provider, MD  niacin (NIASPAN) 1000 MG CR tablet Take 1,000 mg by mouth at bedtime.    Historical Provider, MD  omeprazole (PRILOSEC) 20 MG capsule Take 40 mg by mouth daily.    Historical Provider, MD  ondansetron (ZOFRAN ODT) 4 MG disintegrating tablet Take 1 tablet (4 mg total) by mouth every 8 (eight) hours as needed. 03/06/16   Fredia Sorrow, MD  ondansetron (ZOFRAN) 4 mg TABS tablet Take 1 tablet by mouth every 8 hours when necessary nausea. 03/02/16   Leonard Schwartz, MD  oxyCODONE-acetaminophen (ROXICET) 5-325 MG tablet Take 1 tablet by mouth every 6 (six) hours as needed. Patient taking differently: Take 1 tablet by mouth every 6 (six) hours as needed for moderate pain.  02/26/16   Alvia Grove, PA-C  simvastatin (ZOCOR) 20 MG tablet Take 20 mg by mouth at bedtime.  02/28/15   Historical Provider, MD  sodium polystyrene (KAYEXALATE) 15 GM/60ML suspension Take 30 g by mouth every Monday, Wednesday, and Friday. Drinks entire content of 1 bottle (30 gm) once daily on Monday/Wednesday/Friday    Historical Provider, MD  tamsulosin (FLOMAX) 0.4 MG CAPS capsule Take 0.4 mg by mouth daily.    Historical Provider, MD  torsemide (DEMADEX) 20 MG tablet Take 40 mg by mouth daily.  06/13/15   Historical Provider, MD  Vitamin D, Ergocalciferol, (DRISDOL) 50000  UNITS CAPS capsule Take 1 capsule (50,000 Units total) by mouth every 7 (seven) days. Patient taking differently: Take 50,000 Units by mouth every Monday.  03/08/15   Tesfaye Fanta, MD   BP 131/68 mmHg  Pulse 96  Temp(Src) 98.3 F (36.8 C) (Oral)  Resp 20  SpO2 99% Physical Exam  Constitutional: He appears well-developed and well-nourished.  HENT:  Head: Normocephalic and atraumatic.  Eyes: EOM are normal. Pupils are equal, round, and reactive to light.  Neck: Normal range of motion. Neck supple. No JVD present.  Cardiovascular: Normal rate, regular rhythm and normal heart sounds.   No murmur heard. Pulmonary/Chest: Effort normal and breath sounds normal. He has no wheezes. He has no rales. He exhibits no tenderness.  Abdominal: Soft. He exhibits no distension and no mass. There is tenderness.  Mild TTP to upper abdomen. Bowel sounds decreased.   Musculoskeletal: Normal range of motion. He exhibits no edema.  Lymphadenopathy:    He has no cervical adenopathy.  Neurological: He is alert. No cranial nerve deficit. He exhibits normal muscle tone. Coordination normal.  Skin: Skin is warm and dry. No rash noted.  Nursing note and vitals reviewed.   ED Course  Procedures (including critical care time) DIAGNOSTIC STUDIES: Oxygen Saturation is 99% on RA, normal by my interpretation.    COORDINATION OF CARE: 12:37 AM Discussed treatment plan which includes Ct abdomen pelvis without contrast, CMP, lipase, CBC with differential, and urinalysis with pt at bedside and pt agreed to plan.  Labs Review Results for orders placed or performed during the hospital encounter of 03/10/16  Comprehensive metabolic panel  Result Value Ref Range   Sodium 143 135 - 145 mmol/L   Potassium 3.5 3.5 - 5.1 mmol/L   Chloride 113 (H) 101 - 111 mmol/L   CO2 24 22 - 32 mmol/L   Glucose, Bld 169 (H) 65 - 99 mg/dL   BUN 86 (H) 6 - 20 mg/dL   Creatinine, Ser 8.93 (H) 0.61 - 1.24 mg/dL   Calcium 8.5 (L) 8.9 -  10.3 mg/dL   Total Protein 6.6 6.5 - 8.1 g/dL   Albumin 3.6 3.5 - 5.0 g/dL   AST 15 15 - 41 U/L   ALT 13 (L) 17 - 63 U/L   Alkaline Phosphatase 51 38 - 126 U/L   Total Bilirubin 0.5 0.3 - 1.2 mg/dL   GFR calc non Af Amer 5 (L) >60 mL/min   GFR calc Af Amer 6 (L) >60 mL/min   Anion gap 6 5 - 15  Lipase, blood  Result Value Ref Range   Lipase 116 (H) 11 - 51 U/L  CBC with Differential  Result Value Ref Range   WBC 10.3 4.0 - 10.5 K/uL   RBC 2.91 (L) 4.22 - 5.81 MIL/uL   Hemoglobin 9.7 (L) 13.0 - 17.0 g/dL   HCT 29.5 (L) 39.0 - 52.0 %   MCV 101.4 (H) 78.0 - 100.0 fL   MCH 33.3 26.0 - 34.0 pg   MCHC 32.9 30.0 - 36.0 g/dL   RDW 13.6 11.5 - 15.5 %   Platelets 104 (L) 150 - 400 K/uL   Neutrophils Relative % 82 %   Lymphocytes Relative 9 %   Monocytes Relative 9 %   Eosinophils Relative 0 %   Basophils Relative 0 %   Band Neutrophils 0 %   Metamyelocytes Relative 0 %   Myelocytes 0 %   Promyelocytes Absolute 0 %   Blasts 0 %   nRBC 1 (H) 0 /100 WBC   Other 0 %   Neutro Abs 8.5 (H) 1.7 - 7.7 K/uL   Lymphs Abs 0.9 0.7 - 4.0 K/uL   Monocytes Absolute 0.9 0.1 - 1.0 K/uL   Eosinophils Absolute 0.0 0.0 - 0.7 K/uL   Basophils Absolute 0.0 0.0 - 0.1 K/uL  Urinalysis, Routine w reflex microscopic  Result Value Ref Range   Color, Urine YELLOW YELLOW   APPearance CLEAR CLEAR   Specific Gravity, Urine 1.025 1.005 - 1.030   pH 5.5 5.0 - 8.0   Glucose, UA 100 (A) NEGATIVE mg/dL   Hgb urine dipstick NEGATIVE NEGATIVE   Bilirubin Urine SMALL (A) NEGATIVE   Ketones, ur NEGATIVE NEGATIVE mg/dL   Protein, ur >300 (A) NEGATIVE mg/dL   Nitrite NEGATIVE NEGATIVE   Leukocytes, UA NEGATIVE NEGATIVE  Urine microscopic-add on  Result Value Ref Range   Squamous Epithelial / LPF 0-5 (A) NONE SEEN  WBC, UA 0-5 0 - 5 WBC/hpf   RBC / HPF 0-5 0 - 5 RBC/hpf   Bacteria, UA FEW (A) NONE SEEN  I-Stat CG4 Lactic Acid, ED  Result Value Ref Range   Lactic Acid, Venous 0.51 0.5 - 2.0 mmol/L     Imaging Review Ct Abdomen Pelvis Wo Contrast  03/10/2016  CLINICAL DATA:  Right upper quadrant and left upper quadrant abdominal pain with nausea for 1 week. EXAM: CT ABDOMEN AND PELVIS WITHOUT CONTRAST TECHNIQUE: Multidetector CT imaging of the abdomen and pelvis was performed following the standard protocol without IV contrast. COMPARISON:  09/13/2015 FINDINGS: Mild dependent changes in the lung bases. Coronary artery calcifications. Small pericardial effusion. There is bilateral hydronephrosis and hydroureter with stranding around the kidneys. No renal, ureteral, or bladder stones are identified. Bladder wall is diffusely thickened. Prostate gland is enlarged at 5.2 x 5.5 cm in diameter. Changes could be due to cystitis and pyelonephritis or bladder outlet obstruction with bladder wall hypertrophy and reflux. The unenhanced appearance of the liver, spleen, gallbladder, pancreas, adrenal glands, abdominal aorta, inferior vena cava, and retroperitoneal lymph nodes is unremarkable. Stomach, small bowel, and colon are not abnormally distended. Stool fills the colon. No free air or free fluid in the abdomen. Pelvis: Appendix is not identified. No free or loculated pelvic fluid collections. No pelvic lymphadenopathy. Degenerative changes in the spine. No destructive bone lesions. IMPRESSION: Bilateral hydronephrosis and hydroureter. Bladder wall thickening. Prostate gland enlarged. No stones identified. Changes could be due to cystitis and pyelonephritis or bladder outlet obstruction with bladder wall hypertrophy and reflux. Electronically Signed   By: Lucienne Capers M.D.   On: 03/10/2016 02:50   I have personally reviewed and evaluated these images and lab results as part of my medical decision-making.   MDM   Final diagnoses:  Non-intractable vomiting with nausea, unspecified vomiting type  Acute on chronic renal failure (HCC)  Uremia syndrome  Elevated lipase  Anemia of chronic renal failure,  stage 5 (HCC)  Thrombocytopenia (HCC)  Hydronephrosis, unspecified hydronephrosis type   Ongoing difficulty with nausea and vomiting. Old records are reviewed and he has 2 recent ED visits for vomiting. It is noted that he has chronic renal failure and creatinine has been increasing. I am suspicious that his vomiting is secondary to uremia. He did have a fistula created at 2 weeks ago, but he may need to be dialyzed before the fistula is ready. Because of abdominal pain, he is sent for CT of abdomen and pelvis which shows hydronephrosis. Because of concern for possible post renal failure, Foley catheter was ordered but patient refused. Urinalysis does not show evidence of infection. Laboratory workup does show elevated lipase which is probably not clinically significant and probably related to uremia. Mild thrombocytopenia is present and unchanged from baseline. He is admitted for management of intractable vomiting and probable initiation of hemodialysis. Case is discussed with Dr. Arnoldo Morale of [facility] who agrees to admit the patient.   I personally performed the services described in this documentation, which was scribed in my presence. The recorded information has been reviewed and is accurate.      Delora Fuel, MD [phone]

## 2016-03-11 DIAGNOSIS — N1330 Unspecified hydronephrosis: Secondary | ICD-10-CM

## 2016-03-11 DIAGNOSIS — N320 Bladder-neck obstruction: Secondary | ICD-10-CM

## 2016-03-11 LAB — BASIC METABOLIC PANEL
Anion gap: 11 (ref 5–15)
BUN: 86 mg/dL — ABNORMAL HIGH (ref 6–20)
CO2: 22 mmol/L (ref 22–32)
Calcium: 8.7 mg/dL — ABNORMAL LOW (ref 8.9–10.3)
Chloride: 116 mmol/L — ABNORMAL HIGH (ref 101–111)
Creatinine, Ser: 8.18 mg/dL — ABNORMAL HIGH (ref 0.61–1.24)
GFR calc Af Amer: 7 mL/min — ABNORMAL LOW (ref >60)
GFR calc non Af Amer: 6 mL/min — ABNORMAL LOW (ref >60)
Glucose, Bld: 129 mg/dL — ABNORMAL HIGH (ref 65–99)
Potassium: 3.3 mmol/L — ABNORMAL LOW (ref 3.5–5.1)
Sodium: 149 mmol/L — ABNORMAL HIGH (ref 135–145)

## 2016-03-11 LAB — GLUCOSE, CAPILLARY
Glucose-Capillary: 105 mg/dL — ABNORMAL HIGH (ref 65–99)
Glucose-Capillary: 128 mg/dL — ABNORMAL HIGH (ref 65–99)
Glucose-Capillary: 135 mg/dL — ABNORMAL HIGH (ref 65–99)
Glucose-Capillary: 148 mg/dL — ABNORMAL HIGH (ref 65–99)
Glucose-Capillary: 159 mg/dL — ABNORMAL HIGH (ref 65–99)
Glucose-Capillary: 178 mg/dL — ABNORMAL HIGH (ref 65–99)

## 2016-03-11 LAB — CBC
HCT: 30.1 % — ABNORMAL LOW (ref 39.0–52.0)
Hemoglobin: 9.7 g/dL — ABNORMAL LOW (ref 13.0–17.0)
MCH: 33.7 pg (ref 26.0–34.0)
MCHC: 32.2 g/dL (ref 30.0–36.0)
MCV: 104.5 fL — ABNORMAL HIGH (ref 78.0–100.0)
Platelets: 91 10*3/uL — ABNORMAL LOW (ref 150–400)
RBC: 2.88 MIL/uL — ABNORMAL LOW (ref 4.22–5.81)
RDW: 14.2 % (ref 11.5–15.5)
WBC: 8.5 10*3/uL (ref 4.0–10.5)

## 2016-03-11 LAB — URINE CULTURE: Culture: <10000 — AB

## 2016-03-11 MED ORDER — LIDOCAINE HCL 2 % EX GEL
1.0000 "application " | Freq: Once | CUTANEOUS | Status: AC
  Administered 2016-03-11: 1 via URETHRAL
  Filled 2016-03-11: qty 10

## 2016-03-11 MED ORDER — LIDOCAINE HCL 2 % EX GEL
1.0000 "application " | Freq: Once | CUTANEOUS | Status: DC
  Filled 2016-03-11: qty 5

## 2016-03-11 MED ORDER — SODIUM CHLORIDE 0.45 % IV SOLN
INTRAVENOUS | Status: DC
  Administered 2016-03-11 – 2016-03-16 (×16): via INTRAVENOUS
  Filled 2016-03-11 (×23): qty 1000

## 2016-03-11 NOTE — Consult Note (Signed)
   [facility] CM Inpatient Consult   03/11/2016  CHING ZAYAS 07-27-1946 [id number]  Patient screened for potential [facility] Management services. Patient is eligible for [facility]. Electronic medical record reveals patient's discharge plan is to return to [facility], there were no identifiable [facility] care management needs at this time. [facility] Care Management services not appropriate at this time. If patient's post hospital needs change please place a [facility] Care Management consult.  Of note, [facility] Care Management services would not replace or interfere with any services that are arranged by inpatient case management or social work. For questions please contact:    Royetta Crochet. Laymond Purser, RN, BSN, [place] [phone]) Business Cell  [phone]) Toll Free Office

## 2016-03-11 NOTE — Progress Notes (Signed)
Subjective: Interval History: has complaints of some nausea but no vomiting. Patient denies difficulty breathing.  Objective: Vital signs in last 24 hours: Temp:  [97.8 F (36.6 C)-98.2 F (36.8 C)] 98.2 F (36.8 C) (05/16 0540) Pulse Rate:  [78-97] 87 (05/16 0540) Resp:  [18-20] 20 (05/16 0540) BP: (122-130)/(45-74) 130/65 mmHg (05/16 0540) SpO2:  [97 %-98 %] 97 % (05/16 0540) Weight:  [78.8 kg (173 lb 11.6 oz)] 78.8 kg (173 lb 11.6 oz) (05/16 0540) Weight change: -0.5 kg (-1 lb 1.6 oz)  Intake/Output from previous day: 05/15 0701 - 05/16 0700 In: 3418.3 [P.O.:410; I.V.:3008.3] Out: 501 [Urine:500; Blood:1] Intake/Output this shift:    General appearance: alert, cooperative and no distress Resp: clear to auscultation bilaterally Cardio: regular rate and rhythm, S1, S2 normal, no murmur, click, rub or gallop GI: soft, non-tender; bowel sounds normal; no masses,  no organomegaly Extremities: edema Crest 1+ edema bilaterally  Lab Results:  Recent Labs  03/10/16 0748 03/11/16 0421  WBC 9.9 8.5  HGB 10.5* 9.7*  HCT 31.7* 30.1*  PLT 109* 91*   BMET:  Recent Labs  03/10/16 0748 03/11/16 0421  NA 144 149*  K 3.3* 3.3*  CL 112* 116*  CO2 22 22  GLUCOSE 189* 129*  BUN 90* 86*  CREATININE 9.41* 8.18*  CALCIUM 8.9 8.7*   No results for input(s): PTH in the last 72 hours. Iron Studies: No results for input(s): IRON, TIBC, TRANSFERRIN, FERRITIN in the last 72 hours.  Studies/Results: Ct Abdomen Pelvis Wo Contrast  03/10/2016  CLINICAL DATA:  Right upper quadrant and left upper quadrant abdominal pain with nausea for 1 week. EXAM: CT ABDOMEN AND PELVIS WITHOUT CONTRAST TECHNIQUE: Multidetector CT imaging of the abdomen and pelvis was performed following the standard protocol without IV contrast. COMPARISON:  09/13/2015 FINDINGS: Mild dependent changes in the lung bases. Coronary artery calcifications. Small pericardial effusion. There is bilateral hydronephrosis and  hydroureter with stranding around the kidneys. No renal, ureteral, or bladder stones are identified. Bladder wall is diffusely thickened. Prostate gland is enlarged at 5.2 x 5.5 cm in diameter. Changes could be due to cystitis and pyelonephritis or bladder outlet obstruction with bladder wall hypertrophy and reflux. The unenhanced appearance of the liver, spleen, gallbladder, pancreas, adrenal glands, abdominal aorta, inferior vena cava, and retroperitoneal lymph nodes is unremarkable. Stomach, small bowel, and colon are not abnormally distended. Stool fills the colon. No free air or free fluid in the abdomen. Pelvis: Appendix is not identified. No free or loculated pelvic fluid collections. No pelvic lymphadenopathy. Degenerative changes in the spine. No destructive bone lesions. IMPRESSION: Bilateral hydronephrosis and hydroureter. Bladder wall thickening. Prostate gland enlarged. No stones identified. Changes could be due to cystitis and pyelonephritis or bladder outlet obstruction with bladder wall hypertrophy and reflux. Electronically Signed   By: Lucienne Capers M.D.   On: 03/10/2016 02:50    I have reviewed the patient's current medications.  Assessment/Plan: Problem #1 acute kidney injury superimposed on chronic: Presently seems to be obstructive. Patient is nonoliguric. His BUN and creatinine is improving. Problem #2 chronic renal failure: Stage IV. Etiology thought to be secondary to diabetes/obstructive uropathy/hypertension Problem #3 hypokalemia: Potassium 3.3 low but stable Problem #4 hypernatremia Problem #5 hypertension: His blood pressure he seems to be reasonably controlled Problem #6 anemia: Hemoglobin is slightly lower than our target goal. Problem #7  obstructive uropathy. This is recurrent issue. Presently patient declined Foley catheter placement as the nurses. Urology consult is pending. Plan: Change his IV  fluid to half normal saline with 10 mEq of potassium at 125 mL per  hour. 2] will check renal panel in the morning   LOS: 1 day   Nashira Mcglynn S 03/11/2016,8:43 AM

## 2016-03-11 NOTE — Consult Note (Signed)
Urology Consult  Consulting MD: Legrand Rams  CC: Renal failure, bilateral hydronephrosis  HPI: This is a 70 year old male who underwent cystoscopy, retrogrades and placement of suprapubic tube in the fall of 2016 by Dr. Festus Aloe of our practice. 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 The patient had a suprapubic tube placed at that time due to bladder outlet obstruction with sequelae of hydronephrosis. He refused to have the suprapubic tube replaced, and at this time has no external means of bladder drainage. The patient recently presented with worsening renal insufficiency, and evaluation revealed bilateral hydroureteronephrosis, bladder outlet obstruction, and he has a climbing creatinine, currently above 8. Urologic consultation is requested.  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 He does not know the gravity of his situation.   PMH: Past Medical History  Diagnosis Date  . Hypertension   . Diabetes mellitus   . Smoking   . Abnormal CT scan, kidney 10/06/2011  . UTI (lower urinary tract infection) 10/06/2011  . Bladder wall thickening 10/06/2011  . 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 Poor historian poor historian  . BPH (benign prostatic hypertrophy)   . Anxiety     mental retardation  . Hypothyroidism   . MR (mental retardation)   . Acute pyelonephritis 10/07/2011  . Hyperkalemia   . Renal insufficiency     chronic history  . Renal failure (ARF), acute on chronic (HCC)   . Anemia     normocytic  . Edema      history of lower extremity edema  . Hyperlipidemia   . Impaired speech     PSH: Past Surgical History  Procedure Laterality Date  . Cystoscopy with urethral dilatation N/A 12/29/2013    Procedure: CYSTOSCOPY WITH URETHRAL DILATATION;  Surgeon: Marissa Nestle, MD;  Location: [facility];  Service: Urology;  Laterality: N/A;  . Transurethral resection of prostate  N/A 01/04/2014    Procedure: TRANSURETHRAL RESECTION OF THE PROSTATE (TURP) (procedure #2);  Surgeon: Marissa Nestle, MD;  Location: [facility];  Service: Urology;  Laterality: N/A;  . Circumcision N/A 01/04/2014    Procedure: CIRCUMCISION ADULT (procedure #1);  Surgeon: Marissa Nestle, MD;  Location: [facility];  Service: Urology;  Laterality: N/A;  . Cystoscopy w/ retrogrades Bilateral 06/29/2015    Procedure: CYSTOSCOPY, DILATION OF URETHRAL STRICTURE WITH BILATERAL RETROGRADE PYELOGRAM,SUPRAPUBIC TUBE CHANGE;  Surgeon: Festus Aloe, MD;  Location: [facility];  Service: Urology;  Laterality: Bilateral;  . Av fistula placement Left 07/06/2015    Procedure:  INSERTION LEFT ARM ARTERIOVENOUS GORTEX GRAFT;  Surgeon: Angelia Mould, MD;  Location: [place];  Service: Vascular;  Laterality: Left;  . Peripheral vascular catheterization N/A 10/08/2015    Procedure: A/V Shuntogram;  Surgeon: Angelia Mould, MD;  Location: [facility];  Service: Cardiovascular;  Laterality: N/A;  . Avgg removal Left 10/09/2015    Procedure: REMOVAL OF ARTERIOVENOUS GORETEX GRAFT ([place]) Evacuation of Lymphocele, Vein Patch angioplasty of brachial artery.;  Surgeon: Angelia Mould, MD;  Location: [place];  Service: Vascular;  Laterality: Left;  . Av fistula placement Right 02/26/2016    Procedure: ARTERIOVENOUS (AV) FISTULA CREATION ;  Surgeon: Angelia Mould, MD;  Location: [place];  Service: Vascular;  Laterality: Right;  . Bascilic vein transposition Right 02/26/2016    Procedure: Right BASCILIC VEIN TRANSPOSITION;  Surgeon: Angelia Mould, MD;  Location: [place];  Service: Vascular;  Laterality: Right;    Allergies: No Known Allergies  Medications: Prescriptions prior to admission  Medication Sig Dispense Refill Last Dose  . carvedilol (COREG) 6.25 MG tablet Take 1 tablet (6.25 mg total) by mouth 2 (two) times daily with a meal. 60 tablet 3 03/10/2016 at 0800  . ferrous sulfate 325 (65 FE) MG  tablet Take 325 mg by mouth daily with breakfast.   03/10/2016 at Unknown time  . isosorbide-hydrALAZINE (BIDIL) 20-37.5 MG per tablet Take 1 tablet by mouth 3 (three) times daily. 90 tablet 3 03/10/2016 at Unknown time  . levothyroxine (SYNTHROID, LEVOTHROID) 75 MCG tablet Take 75 mcg by mouth daily before breakfast.    03/10/2016 at Unknown time  . linagliptin (TRADJENTA) 5 MG TABS tablet Take 5 mg by mouth daily.   03/10/2016 at Unknown time  . niacin (NIASPAN) 1000 MG CR tablet Take 1,000 mg by mouth at bedtime.   03/10/2016 at Unknown time  . omeprazole (PRILOSEC) 20 MG capsule Take 40 mg by mouth daily.   03/10/2016 at Unknown time  . ondansetron (ZOFRAN) 4 mg TABS tablet Take 1 tablet by mouth every 8 hours when necessary nausea. 20 tablet 0 unknown  . oxyCODONE-acetaminophen (ROXICET) 5-325 MG tablet Take 1 tablet by mouth every 6 (six) hours as needed. (Patient taking differently: Take 1 tablet by mouth every 6 (six) hours as needed for moderate pain. ) 20 tablet 0 unknown  . simvastatin (ZOCOR) 20 MG tablet Take 20 mg by mouth at bedtime.    03/09/2016 at Unknown time  . sodium polystyrene (KAYEXALATE) 15 GM/60ML suspension Take 30 g by mouth every Monday, Wednesday, and Friday. Drinks entire content of 1 bottle (30 gm) once daily on Monday/Wednesday/Friday   03/10/2016 at Unknown time  . tamsulosin (FLOMAX) 0.4 MG CAPS capsule Take 0.4 mg by mouth daily.   03/10/2016 at Unknown time  . torsemide (DEMADEX) 20 MG tablet Take 40 mg by mouth daily.    03/10/2016 at Unknown time  . Vitamin D, Ergocalciferol, (DRISDOL) 50000 UNITS CAPS capsule Take 1 capsule (50,000 Units total) by mouth every 7 (seven) days. (Patient taking differently: Take 50,000 Units by mouth every Monday. ) 30 capsule 3 03/10/2016 at Unknown time     Social History: Social History   Social History  . Marital Status: Single    Spouse Name: N/A  . Number of Children: N/A  . Years of Education: N/A   Occupational History  .  Not on file.   Social History Main Topics  . Smoking status: Never Smoker   . Smokeless tobacco: Never Used  . Alcohol Use: No     Comment: occ. use   . Drug Use: No  . Sexual Activity: No   Other Topics Concern  . Not on file   Social History Narrative   Lives at assisted living facility    Family History: Family History  Problem Relation Age of Onset  . Cancer Mother     Review of Systems: Positive:Unable to assess due to patient's mental status Negative: .  A further 10 point review of systems was negative except what is listed in the HPI.  Physical Exam: @VITALS2 @ General: No acute distress.  Awake. Head:  Normocephalic.  Atraumatic. ENT:  EOMI.  Mucous membranes moist. Poor dentition  Neck:  Supple.  No lymphadenopathy. CV:  S1 present. S2 present. Regular rate. Pulmonary: Equal effort bilaterally.  Clear to auscultation  Skin:  Normal turgor.  No visible rash. Extremity: No gross  deformity of bilateral upper extremities.  No gross deformity of    bilateral lower extremities. Neurologic: Alert. Somewhat cooperative   Penis:  circumcised.  No lesions. Urethra: No Foley catheter in place.  Orthotopic meatus. Scrotum: No lesions.  No ecchymosis.  No erythema. Testicles: Descended bilaterally.  No masses bilaterally. Epididymis: Palpable bilaterally.  Non Tender to palpation.  Studies:  Recent Labs     03/10/16  0748  03/11/16  0421  HGB  10.5*  9.7*  WBC  9.9  8.5  PLT  109*  91*    Recent Labs     03/10/16  0748  03/11/16  0421  NA  144  149*  K  3.3*  3.3*  CL  112*  116*  CO2  22  22  BUN  90*  86*  CREATININE  9.41*  8.18*  CALCIUM  8.9  8.7*  GFRNONAA  5*  6*  GFRAA  6*  7*     No results for input(s): INR, APTT in the last 72 hours.  Invalid input(s): PT   Invalid input(s): ABG  After combing the patient, and explaining the process, his penis was cleansed with Betadine, and 2% viscous lidocaine was introduced into the urethra. A 16  French coud-tip catheter was then easily placed. The balloon was filled with 20 mL of water to limit the risk of patient removing this. Clear urine was obtained.   Assessment:   1. Bladder outlet obstruction/neurogenic bladder secondary to combination of patient's mental status and history of bulbous urethral stricture. He has worsening renal insufficiency. Foley catheter was placed today  2. Bilateral hydronephrosis secondary to bladder outlet obstruction, now treated with Foley drainage  plan:  I would assume that bladder drainage will improve the patient's acute renal insufficiency-over the next 48 hours, hopefully we will see declining creatinine and improved patient clinical status.  We will follow with you during this hospitalization.  Cc: Dr. Legrand Rams    [phone]

## 2016-03-11 NOTE — Progress Notes (Signed)
Subjective: Patient is resting. No new complaint. His creatinine is slightly improving. Urology consult is pending. No nausea or vomiting.  Objective: Vital signs in last 24 hours: Temp:  [97.8 F (36.6 C)-98.2 F (36.8 C)] 98.2 F (36.8 C) (05/16 0540) Pulse Rate:  [78-97] 87 (05/16 0540) Resp:  [18-20] 20 (05/16 0540) BP: (122-130)/(45-74) 130/65 mmHg (05/16 0540) SpO2:  [97 %-98 %] 97 % (05/16 0540) Weight:  [78.8 kg (173 lb 11.6 oz)] 78.8 kg (173 lb 11.6 oz) (05/16 0540) Weight change: -0.5 kg (-1 lb 1.6 oz) Last BM Date: 03/10/16  Intake/Output from previous day: 05/15 0701 - 05/16 0700 In: 3418.3 [P.O.:410; I.V.:3008.3] Out: 501 [Urine:500; Blood:1]  PHYSICAL EXAM General appearance: alert and no distress Resp: diminished breath sounds bilaterally and rhonchi bilaterally Cardio: S1, S2 normal GI: soft, non-tender; bowel sounds normal; no masses,  no organomegaly Extremities: extremities normal, atraumatic, no cyanosis or edema  Lab Results:  Results for orders placed or performed during the hospital encounter of 03/10/16 (from the past 48 hour(s))  Comprehensive metabolic panel     Status: Abnormal   Collection Time: 03/10/16  1:55 AM  Result Value Ref Range   Sodium 143 135 - 145 mmol/L   Potassium 3.5 3.5 - 5.1 mmol/L   Chloride 113 (H) 101 - 111 mmol/L   CO2 24 22 - 32 mmol/L   Glucose, Bld 169 (H) 65 - 99 mg/dL   BUN 86 (H) 6 - 20 mg/dL   Creatinine, Ser 8.93 (H) 0.61 - 1.24 mg/dL   Calcium 8.5 (L) 8.9 - 10.3 mg/dL   Total Protein 6.6 6.5 - 8.1 g/dL   Albumin 3.6 3.5 - 5.0 g/dL   AST 15 15 - 41 U/L   ALT 13 (L) 17 - 63 U/L   Alkaline Phosphatase 51 38 - 126 U/L   Total Bilirubin 0.5 0.3 - 1.2 mg/dL   GFR calc non Af Amer 5 (L) >60 mL/min   GFR calc Af Amer 6 (L) >60 mL/min    Comment: (NOTE) The eGFR has been calculated using the CKD EPI equation. This calculation has not been validated in all clinical situations. eGFR's persistently <60 mL/min signify  possible Chronic Kidney Disease.    Anion gap 6 5 - 15  Lipase, blood     Status: Abnormal   Collection Time: 03/10/16  1:55 AM  Result Value Ref Range   Lipase 116 (H) 11 - 51 U/L  CBC with Differential     Status: Abnormal   Collection Time: 03/10/16  1:55 AM  Result Value Ref Range   WBC 10.3 4.0 - 10.5 K/uL   RBC 2.91 (L) 4.22 - 5.81 MIL/uL   Hemoglobin 9.7 (L) 13.0 - 17.0 g/dL   HCT 29.5 (L) 39.0 - 52.0 %   MCV 101.4 (H) 78.0 - 100.0 fL   MCH 33.3 26.0 - 34.0 pg   MCHC 32.9 30.0 - 36.0 g/dL   RDW 13.6 11.5 - 15.5 %   Platelets 104 (L) 150 - 400 K/uL    Comment: SPECIMEN CHECKED FOR CLOTS   Neutrophils Relative % 82 %   Lymphocytes Relative 9 %   Monocytes Relative 9 %   Eosinophils Relative 0 %   Basophils Relative 0 %   Band Neutrophils 0 %   Metamyelocytes Relative 0 %   Myelocytes 0 %   Promyelocytes Absolute 0 %   Blasts 0 %   nRBC 1 (H) 0 /100 WBC   Other 0 %  Neutro Abs 8.5 (H) 1.7 - 7.7 K/uL   Lymphs Abs 0.9 0.7 - 4.0 K/uL   Monocytes Absolute 0.9 0.1 - 1.0 K/uL   Eosinophils Absolute 0.0 0.0 - 0.7 K/uL   Basophils Absolute 0.0 0.0 - 0.1 K/uL  I-Stat CG4 Lactic Acid, ED     Status: None   Collection Time: 03/10/16  2:01 AM  Result Value Ref Range   Lactic Acid, Venous 0.51 0.5 - 2.0 mmol/L  Urinalysis, Routine w reflex microscopic     Status: Abnormal   Collection Time: 03/10/16  4:05 AM  Result Value Ref Range   Color, Urine YELLOW YELLOW   APPearance CLEAR CLEAR   Specific Gravity, Urine 1.025 1.005 - 1.030   pH 5.5 5.0 - 8.0   Glucose, UA 100 (A) NEGATIVE mg/dL   Hgb urine dipstick NEGATIVE NEGATIVE   Bilirubin Urine SMALL (A) NEGATIVE   Ketones, ur NEGATIVE NEGATIVE mg/dL   Protein, ur >300 (A) NEGATIVE mg/dL   Nitrite NEGATIVE NEGATIVE   Leukocytes, UA NEGATIVE NEGATIVE  Urine microscopic-add on     Status: Abnormal   Collection Time: 03/10/16  4:05 AM  Result Value Ref Range   Squamous Epithelial / LPF 0-5 (A) NONE SEEN   WBC, UA 0-5 0 -  5 WBC/hpf   RBC / HPF 0-5 0 - 5 RBC/hpf   Bacteria, UA FEW (A) NONE SEEN  Glucose, capillary     Status: Abnormal   Collection Time: 03/10/16  7:41 AM  Result Value Ref Range   Glucose-Capillary 187 (H) 65 - 99 mg/dL  Basic metabolic panel     Status: Abnormal   Collection Time: 03/10/16  7:48 AM  Result Value Ref Range   Sodium 144 135 - 145 mmol/L   Potassium 3.3 (L) 3.5 - 5.1 mmol/L   Chloride 112 (H) 101 - 111 mmol/L   CO2 22 22 - 32 mmol/L   Glucose, Bld 189 (H) 65 - 99 mg/dL   BUN 90 (H) 6 - 20 mg/dL   Creatinine, Ser 9.41 (H) 0.61 - 1.24 mg/dL   Calcium 8.9 8.9 - 10.3 mg/dL   GFR calc non Af Amer 5 (L) >60 mL/min   GFR calc Af Amer 6 (L) >60 mL/min    Comment: (NOTE) The eGFR has been calculated using the CKD EPI equation. This calculation has not been validated in all clinical situations. eGFR's persistently <60 mL/min signify possible Chronic Kidney Disease.    Anion gap 10 5 - 15  CBC     Status: Abnormal   Collection Time: 03/10/16  7:48 AM  Result Value Ref Range   WBC 9.9 4.0 - 10.5 K/uL   RBC 3.05 (L) 4.22 - 5.81 MIL/uL   Hemoglobin 10.5 (L) 13.0 - 17.0 g/dL   HCT 31.7 (L) 39.0 - 52.0 %   MCV 103.9 (H) 78.0 - 100.0 fL   MCH 34.4 (H) 26.0 - 34.0 pg   MCHC 33.1 30.0 - 36.0 g/dL   RDW 14.0 11.5 - 15.5 %   Platelets 109 (L) 150 - 400 K/uL    Comment: SPECIMEN CHECKED FOR CLOTS PLATELET COUNT CONFIRMED BY SMEAR   Glucose, capillary     Status: Abnormal   Collection Time: 03/10/16 11:23 AM  Result Value Ref Range   Glucose-Capillary 205 (H) 65 - 99 mg/dL  Glucose, capillary     Status: Abnormal   Collection Time: 03/10/16  8:08 PM  Result Value Ref Range   Glucose-Capillary 139 (H) 65 -  99 mg/dL   Comment 1 Notify RN    Comment 2 Document in Chart   Glucose, capillary     Status: Abnormal   Collection Time: 03/11/16 12:18 AM  Result Value Ref Range   Glucose-Capillary 148 (H) 65 - 99 mg/dL   Comment 1 Notify RN    Comment 2 Document in Chart   Basic  metabolic panel     Status: Abnormal   Collection Time: 03/11/16  4:21 AM  Result Value Ref Range   Sodium 149 (H) 135 - 145 mmol/L   Potassium 3.3 (L) 3.5 - 5.1 mmol/L   Chloride 116 (H) 101 - 111 mmol/L   CO2 22 22 - 32 mmol/L   Glucose, Bld 129 (H) 65 - 99 mg/dL   BUN 86 (H) 6 - 20 mg/dL   Creatinine, Ser 8.18 (H) 0.61 - 1.24 mg/dL   Calcium 8.7 (L) 8.9 - 10.3 mg/dL   GFR calc non Af Amer 6 (L) >60 mL/min   GFR calc Af Amer 7 (L) >60 mL/min    Comment: (NOTE) The eGFR has been calculated using the CKD EPI equation. This calculation has not been validated in all clinical situations. eGFR's persistently <60 mL/min signify possible Chronic Kidney Disease.    Anion gap 11 5 - 15  CBC     Status: Abnormal   Collection Time: 03/11/16  4:21 AM  Result Value Ref Range   WBC 8.5 4.0 - 10.5 K/uL   RBC 2.88 (L) 4.22 - 5.81 MIL/uL   Hemoglobin 9.7 (L) 13.0 - 17.0 g/dL   HCT 30.1 (L) 39.0 - 52.0 %   MCV 104.5 (H) 78.0 - 100.0 fL   MCH 33.7 26.0 - 34.0 pg   MCHC 32.2 30.0 - 36.0 g/dL   RDW 14.2 11.5 - 15.5 %   Platelets 91 (L) 150 - 400 K/uL  Glucose, capillary     Status: Abnormal   Collection Time: 03/11/16  4:32 AM  Result Value Ref Range   Glucose-Capillary 128 (H) 65 - 99 mg/dL   Comment 1 Notify RN    Comment 2 Document in Chart   Glucose, capillary     Status: Abnormal   Collection Time: 03/11/16  7:05 AM  Result Value Ref Range   Glucose-Capillary 105 (H) 65 - 99 mg/dL   Comment 1 Notify RN    Comment 2 Document in Chart     ABGS No results for input(s): PHART, PO2ART, TCO2, HCO3 in the last 72 hours.  Invalid input(s): PCO2 CULTURES No results found for this or any previous visit (from the past 240 hour(s)). Studies/Results: Ct Abdomen Pelvis Wo Contrast  03/10/2016  CLINICAL DATA:  Right upper quadrant and left upper quadrant abdominal pain with nausea for 1 week. EXAM: CT ABDOMEN AND PELVIS WITHOUT CONTRAST TECHNIQUE: Multidetector CT imaging of the abdomen and  pelvis was performed following the standard protocol without IV contrast. COMPARISON:  09/13/2015 FINDINGS: Mild dependent changes in the lung bases. Coronary artery calcifications. Small pericardial effusion. There is bilateral hydronephrosis and hydroureter with stranding around the kidneys. No renal, ureteral, or bladder stones are identified. Bladder wall is diffusely thickened. Prostate gland is enlarged at 5.2 x 5.5 cm in diameter. Changes could be due to cystitis and pyelonephritis or bladder outlet obstruction with bladder wall hypertrophy and reflux. The unenhanced appearance of the liver, spleen, gallbladder, pancreas, adrenal glands, abdominal aorta, inferior vena cava, and retroperitoneal lymph nodes is unremarkable. Stomach, small bowel, and colon are not  abnormally distended. Stool fills the colon. No free air or free fluid in the abdomen. Pelvis: Appendix is not identified. No free or loculated pelvic fluid collections. No pelvic lymphadenopathy. Degenerative changes in the spine. No destructive bone lesions. IMPRESSION: Bilateral hydronephrosis and hydroureter. Bladder wall thickening. Prostate gland enlarged. No stones identified. Changes could be due to cystitis and pyelonephritis or bladder outlet obstruction with bladder wall hypertrophy and reflux. Electronically Signed   By: Lucienne Capers M.D.   On: 03/10/2016 02:50    Medications: I have reviewed the patient's current medications.  Assesment:   Principal Problem:   Uremia Active Problems:   DM (diabetes mellitus), type 2, uncontrolled (HCC)   BPH (benign prostatic hypertrophy) with urinary retention   Anemia, chronic disease   Essential hypertension   Nausea & vomiting   End stage renal disease (HCC)   Hydronephrosis, bilateral   Acute on chronic renal failure ([place])    Plan:  Medications reviewed Continue current treatment Nephrology consult appreciated Urology consult pending Will continue to monior cbc/bmp     LOS: 1 day   Selestino Nila 03/11/2016, 8:14 AM

## 2016-03-12 LAB — GLUCOSE, CAPILLARY
Glucose-Capillary: 101 mg/dL — ABNORMAL HIGH (ref 65–99)
Glucose-Capillary: 106 mg/dL — ABNORMAL HIGH (ref 65–99)
Glucose-Capillary: 115 mg/dL — ABNORMAL HIGH (ref 65–99)
Glucose-Capillary: 129 mg/dL — ABNORMAL HIGH (ref 65–99)
Glucose-Capillary: 141 mg/dL — ABNORMAL HIGH (ref 65–99)
Glucose-Capillary: 145 mg/dL — ABNORMAL HIGH (ref 65–99)
Glucose-Capillary: 159 mg/dL — ABNORMAL HIGH (ref 65–99)
Glucose-Capillary: 99 mg/dL (ref 65–99)

## 2016-03-12 LAB — RENAL FUNCTION PANEL
Albumin: 3.5 g/dL (ref 3.5–5.0)
Anion gap: 6 (ref 5–15)
BUN: 73 mg/dL — ABNORMAL HIGH (ref 6–20)
CO2: 22 mmol/L (ref 22–32)
Calcium: 8.6 mg/dL — ABNORMAL LOW (ref 8.9–10.3)
Chloride: 118 mmol/L — ABNORMAL HIGH (ref 101–111)
Creatinine, Ser: 6.07 mg/dL — ABNORMAL HIGH (ref 0.61–1.24)
GFR calc Af Amer: 10 mL/min — ABNORMAL LOW (ref >60)
GFR calc non Af Amer: 9 mL/min — ABNORMAL LOW (ref >60)
Glucose, Bld: 105 mg/dL — ABNORMAL HIGH (ref 65–99)
Phosphorus: 3.9 mg/dL (ref 2.5–4.6)
Potassium: 3.6 mmol/L (ref 3.5–5.1)
Sodium: 146 mmol/L — ABNORMAL HIGH (ref 135–145)

## 2016-03-12 NOTE — Progress Notes (Signed)
Subjective: His renal function is improving. Foley cathetetr was inserted by urology. Patient is resting. No new complaint. .  Objective: Vital signs in last 24 hours: Temp:  [98.1 F (36.7 C)-98.9 F (37.2 C)] 98.9 F (37.2 C) (05/17 0615) Pulse Rate:  [73-84] 73 (05/17 0615) Resp:  [20] 20 (05/17 0615) BP: (124-133)/(61-68) 133/68 mmHg (05/17 0615) SpO2:  [95 %-96 %] 96 % (05/17 0615) Weight:  [78.52 kg (173 lb 1.7 oz)] 78.52 kg (173 lb 1.7 oz) (05/17 0615) Weight change: -0.28 kg (-9.9 oz) Last BM Date: 03/10/16  Intake/Output from previous day: 05/16 0701 - 05/17 0700 In: 3000 [P.O.:600; I.V.:2400] Out: 1400 [Urine:1400]  PHYSICAL EXAM General appearance: alert and no distress Resp: diminished breath sounds bilaterally and rhonchi bilaterally Cardio: S1, S2 normal GI: soft, non-tender; bowel sounds normal; no masses,  no organomegaly Extremities: extremities normal, atraumatic, no cyanosis or edema  Lab Results:  Results for orders placed or performed during the hospital encounter of 03/10/16 (from the past 48 hour(s))  Glucose, capillary     Status: Abnormal   Collection Time: 03/10/16 11:23 AM  Result Value Ref Range   Glucose-Capillary 205 (H) 65 - 99 mg/dL  Glucose, capillary     Status: Abnormal   Collection Time: 03/10/16  8:08 PM  Result Value Ref Range   Glucose-Capillary 139 (H) 65 - 99 mg/dL   Comment 1 Notify RN    Comment 2 Document in Chart   Glucose, capillary     Status: Abnormal   Collection Time: 03/11/16 12:18 AM  Result Value Ref Range   Glucose-Capillary 148 (H) 65 - 99 mg/dL   Comment 1 Notify RN    Comment 2 Document in Chart   Basic metabolic panel     Status: Abnormal   Collection Time: 03/11/16  4:21 AM  Result Value Ref Range   Sodium 149 (H) 135 - 145 mmol/L   Potassium 3.3 (L) 3.5 - 5.1 mmol/L   Chloride 116 (H) 101 - 111 mmol/L   CO2 22 22 - 32 mmol/L   Glucose, Bld 129 (H) 65 - 99 mg/dL   BUN 86 (H) 6 - 20 mg/dL   Creatinine,  Ser 8.18 (H) 0.61 - 1.24 mg/dL   Calcium 8.7 (L) 8.9 - 10.3 mg/dL   GFR calc non Af Amer 6 (L) >60 mL/min   GFR calc Af Amer 7 (L) >60 mL/min    Comment: (NOTE) The eGFR has been calculated using the CKD EPI equation. This calculation has not been validated in all clinical situations. eGFR's persistently <60 mL/min signify possible Chronic Kidney Disease.    Anion gap 11 5 - 15  CBC     Status: Abnormal   Collection Time: 03/11/16  4:21 AM  Result Value Ref Range   WBC 8.5 4.0 - 10.5 K/uL   RBC 2.88 (L) 4.22 - 5.81 MIL/uL   Hemoglobin 9.7 (L) 13.0 - 17.0 g/dL   HCT 30.1 (L) 39.0 - 52.0 %   MCV 104.5 (H) 78.0 - 100.0 fL   MCH 33.7 26.0 - 34.0 pg   MCHC 32.2 30.0 - 36.0 g/dL   RDW 14.2 11.5 - 15.5 %   Platelets 91 (L) 150 - 400 K/uL  Glucose, capillary     Status: Abnormal   Collection Time: 03/11/16  4:32 AM  Result Value Ref Range   Glucose-Capillary 128 (H) 65 - 99 mg/dL   Comment 1 Notify RN    Comment 2 Document in Chart  Glucose, capillary     Status: Abnormal   Collection Time: 03/11/16  7:05 AM  Result Value Ref Range   Glucose-Capillary 105 (H) 65 - 99 mg/dL   Comment 1 Notify RN    Comment 2 Document in Chart   Glucose, capillary     Status: Abnormal   Collection Time: 03/11/16 11:18 AM  Result Value Ref Range   Glucose-Capillary 159 (H) 65 - 99 mg/dL   Comment 1 Notify RN    Comment 2 Document in Chart   Glucose, capillary     Status: Abnormal   Collection Time: 03/11/16  4:17 PM  Result Value Ref Range   Glucose-Capillary 178 (H) 65 - 99 mg/dL   Comment 1 Notify RN    Comment 2 Document in Chart   Glucose, capillary     Status: Abnormal   Collection Time: 03/11/16  8:46 PM  Result Value Ref Range   Glucose-Capillary 135 (H) 65 - 99 mg/dL   Comment 1 Notify RN    Comment 2 Document in Chart   Glucose, capillary     Status: Abnormal   Collection Time: 03/12/16 12:24 AM  Result Value Ref Range   Glucose-Capillary 115 (H) 65 - 99 mg/dL   Comment 1  Notify RN    Comment 2 Document in Chart   Renal function panel     Status: Abnormal   Collection Time: 03/12/16  4:26 AM  Result Value Ref Range   Sodium 146 (H) 135 - 145 mmol/L   Potassium 3.6 3.5 - 5.1 mmol/L   Chloride 118 (H) 101 - 111 mmol/L   CO2 22 22 - 32 mmol/L   Glucose, Bld 105 (H) 65 - 99 mg/dL   BUN 73 (H) 6 - 20 mg/dL   Creatinine, Ser 6.07 (H) 0.61 - 1.24 mg/dL   Calcium 8.6 (L) 8.9 - 10.3 mg/dL   Phosphorus 3.9 2.5 - 4.6 mg/dL   Albumin 3.5 3.5 - 5.0 g/dL   GFR calc non Af Amer 9 (L) >60 mL/min   GFR calc Af Amer 10 (L) >60 mL/min    Comment: (NOTE) The eGFR has been calculated using the CKD EPI equation. This calculation has not been validated in all clinical situations. eGFR's persistently <60 mL/min signify possible Chronic Kidney Disease.    Anion gap 6 5 - 15  Glucose, capillary     Status: Abnormal   Collection Time: 03/12/16  4:49 AM  Result Value Ref Range   Glucose-Capillary 106 (H) 65 - 99 mg/dL  Glucose, capillary     Status: Abnormal   Collection Time: 03/12/16  7:08 AM  Result Value Ref Range   Glucose-Capillary 101 (H) 65 - 99 mg/dL   Comment 1 Notify RN    Comment 2 Document in Chart     ABGS No results for input(s): PHART, PO2ART, TCO2, HCO3 in the last 72 hours.  Invalid input(s): PCO2 CULTURES Recent Results (from the past 240 hour(s))  Urine culture     Status: Abnormal   Collection Time: 03/10/16  4:05 AM  Result Value Ref Range Status   Specimen Description URINE, CLEAN CATCH  Final   Special Requests NONE  Final   Culture (A)  Final    <10,000 COLONIES/mL INSIGNIFICANT GROWTH Performed at [facility]    Report Status 03/11/2016 FINAL  Final   Studies/Results: No results found.  Medications: I have reviewed the patient's current medications.  Assesment:   Principal Problem:   Uremia Active  Problems:   DM (diabetes mellitus), type 2, uncontrolled (HCC)   BPH (benign prostatic hypertrophy) with urinary  retention   Anemia, chronic disease   Essential hypertension   Nausea & vomiting   End stage renal disease (HCC)   Hydronephrosis, bilateral   Acute on chronic renal failure ([place])    Plan:  Medications reviewed Continue current treatment Urology consult appreciated Will continue to monior cbc/bmp    LOS: 2 days   Raeana Blinn 03/12/2016, 8:11 AM

## 2016-03-12 NOTE — Care Management Important Message (Signed)
Important Message  Patient Details  Name: Zachary [name] MRN: [id number] Date of Birth: [birth year]   Medicare Important Message Given:  Yes    Sherald Barge, RN 03/12/2016, 9:34 AM

## 2016-03-12 NOTE — Progress Notes (Signed)
Zachary [name]  MRN: [id number]  DOB/AGE: September 26, 1946 70 y.o.  Primary Care Physician:FANTA,TESFAYE, MD  Admit date: 03/10/2016  Chief Complaint:  Chief Complaint  Patient presents with  . Emesis    S-Pt presented on  03/10/2016 with  Chief Complaint  Patient presents with  . Emesis  .    Pt offers no new complaints .Pt says " I am little better"   Meds . carvedilol  6.25 mg Oral BID WC  . ferrous sulfate  325 mg Oral Q breakfast  . insulin aspart  0-9 Units Subcutaneous Q4H  . isosorbide-hydrALAZINE  1 tablet Oral TID  . levothyroxine  75 mcg Oral QAC breakfast  . pantoprazole  40 mg Oral Daily  . simvastatin  20 mg Oral QHS  . sodium chloride flush  3 mL Intravenous Q12H  . tamsulosin  0.4 mg Oral Daily     Physical Exam: Vital signs in last 24 hours: Temp:  [98.1 F (36.7 C)-98.9 F (37.2 C)] 98.9 F (37.2 C) (05/17 0615) Pulse Rate:  [73-84] 73 (05/17 0615) Resp:  [20] 20 (05/17 0615) BP: (124-133)/(61-68) 133/68 mmHg (05/17 0615) SpO2:  [95 %-96 %] 96 % (05/17 0615) Weight:  [173 lb 1.7 oz (78.52 kg)] 173 lb 1.7 oz (78.52 kg) (05/17 0615) Weight change: -9.9 oz (-0.28 kg) Last BM Date: 03/10/16  Intake/Output from previous day: 05/16 0701 - 05/17 0700 In: 3000 [P.O.:600; I.V.:2400] Out: 1400 [Urine:1400]     Physical Exam: General- pt is awake, follow commands Resp- No acute REsp distress, Rhonchi minimal CVS- S1S2 regular in rate and rhythm GIT- BS+, soft, NT, ND. EXT- NO LE Edema, NO Cyanosis Access- AVF +  Lab Results: CBC  Recent Labs  03/10/16 0748 03/11/16 0421  WBC 9.9 8.5  HGB 10.5* 9.7*  HCT 31.7* 30.1*  PLT 109* 91*    BMET  Recent Labs  03/11/16 0421 03/12/16 0426  NA 149* 146*  K 3.3* 3.6  CL 116* 118*  CO2 22 22  GLUCOSE 129* 105*  BUN 86* 73*  CREATININE 8.18* 6.07*  CALCIUM 8.7* 8.6*   Trend Creat   2017 9.41=>8.18=>6.07    2016   4.87=>3.61-3.68       3.3--4.4( July /August admission)  6.67=>6.87=>6.34=>3.14( June admission)       3.69=>4.23=>3.67=>3.46--3.47( May admission) 2015   3.23==>1.67  MICRO Recent Results (from the past 240 hour(s))  Urine culture     Status: Abnormal   Collection Time: 03/10/16  4:05 AM  Result Value Ref Range Status   Specimen Description URINE, CLEAN CATCH  Final   Special Requests NONE  Final   Culture (A)  Final    <10,000 COLONIES/mL INSIGNIFICANT GROWTH Performed at [facility]    Report Status 03/11/2016 FINAL  Final      Lab Results  Component Value Date   PTH 61 10/09/2015   CALCIUM 8.6* 03/12/2016   CAION 1.24 10/08/2015   PHOS 3.9 03/12/2016          Renal u/s in 2015 1. Moderate bilateral hydronephrosis. There is also evidence of  hydroureter bilaterally. No obstructing masses nor appreciable  calculi identified. Clinical correlation recommended and if  clinically warranted further evaluation with renal protocol CT is  recommended.  2. Trabeculated bladder wall and enlarged prostate. These findings  may reflect sequela of a component of bladder outlet obstruction.  Clinical correlation recommended.  Renal u/s in May 2016 Right Kidney 14.6 cm. Diffusely echogenic. No significant change in dilatation  of the renal collecting system.  Left Kidney:: 13.6 cm. Diffusely echogenic. No significant change in dilatation of the renal collecting system.  Bladder:Distended, with a calculated prevoid volume of 1137 cc and postvoid volume of 836 cc. The previously seen bladder mass is no longer Visualized.  Renal U/s in June 2016 Right Kidney:  Length: 13.3 cm. Severe hydronephrosis similar to comparison exam. Increased renal cortical echogenicity.  Left Kidney:  Length: 12.7 cm. Severe hydronephrosis with increased cortical echogenicity. No significant change from prior.  Bladder:  The bladder is distended with the large which postvoid residual of 1120 cc disease.  IMPRESSION: 1. No  change in bilateral severe hydronephrosis. 2. No change in distended bladder with large postvoid residual.  CT abdomen May 2017 IMPRESSION: Bilateral hydronephrosis and hydroureter. Bladder wall thickening. Prostate gland enlarged. No stones identified. Changes could be due to cystitis and pyelonephritis or bladder outlet obstruction with bladder wall hypertrophy and reflux.   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 Phosphorus not  at goal.    5)CNS-Hx of mental Retardation  PMD following  6)Electrolytes  Hypokalemic  Hypernatremia  improving, now much better   7)Acid base Co2  at goal.   Plan:  Will continue current care Will rechcek BMEt   [place] S 03/12/2016, 8:50 AM

## 2016-03-13 LAB — GLUCOSE, CAPILLARY
Glucose-Capillary: 101 mg/dL — ABNORMAL HIGH (ref 65–99)
Glucose-Capillary: 105 mg/dL — ABNORMAL HIGH (ref 65–99)
Glucose-Capillary: 123 mg/dL — ABNORMAL HIGH (ref 65–99)
Glucose-Capillary: 131 mg/dL — ABNORMAL HIGH (ref 65–99)
Glucose-Capillary: 133 mg/dL — ABNORMAL HIGH (ref 65–99)
Glucose-Capillary: 138 mg/dL — ABNORMAL HIGH (ref 65–99)

## 2016-03-13 LAB — BASIC METABOLIC PANEL
Anion gap: 5 (ref 5–15)
BUN: 56 mg/dL — ABNORMAL HIGH (ref 6–20)
CO2: 21 mmol/L — ABNORMAL LOW (ref 22–32)
Calcium: 8.9 mg/dL (ref 8.9–10.3)
Chloride: 122 mmol/L — ABNORMAL HIGH (ref 101–111)
Creatinine, Ser: 4.09 mg/dL — ABNORMAL HIGH (ref 0.61–1.24)
GFR calc Af Amer: 16 mL/min — ABNORMAL LOW (ref >60)
GFR calc non Af Amer: 14 mL/min — ABNORMAL LOW (ref >60)
Glucose, Bld: 104 mg/dL — ABNORMAL HIGH (ref 65–99)
Potassium: 3.8 mmol/L (ref 3.5–5.1)
Sodium: 148 mmol/L — ABNORMAL HIGH (ref 135–145)

## 2016-03-13 LAB — MRSA PCR SCREENING: MRSA by PCR: NEGATIVE

## 2016-03-13 NOTE — Progress Notes (Signed)
Subjective: Patient is resting. He is being hydrated. His BMP for today is pending.  Objective: Vital signs in last 24 hours: Temp:  [97.6 F (36.4 C)-99.2 F (37.3 C)] 99.2 F (37.3 C) (05/18 0015) Pulse Rate:  [95-106] 100 (05/18 0015) Resp:  [20] 20 (05/18 0015) BP: (128-164)/(68-89) 164/73 mmHg (05/18 0015) SpO2:  [99 %-100 %] 99 % (05/18 0015) Weight change:  Last BM Date: 03/11/16  Intake/Output from previous day: 05/17 0701 - 05/18 0700 In: 3356.7 [P.O.:240; I.V.:3116.7] Out: -   PHYSICAL EXAM General appearance: alert and no distress Resp: diminished breath sounds bilaterally and rhonchi bilaterally Cardio: S1, S2 normal GI: soft, non-tender; bowel sounds normal; no masses,  no organomegaly Extremities: extremities normal, atraumatic, no cyanosis or edema  Lab Results:  Results for orders placed or performed during the hospital encounter of 03/10/16 (from the past 48 hour(s))  Glucose, capillary     Status: Abnormal   Collection Time: 03/11/16 11:18 AM  Result Value Ref Range   Glucose-Capillary 159 (H) 65 - 99 mg/dL   Comment 1 Notify RN    Comment 2 Document in Chart   Glucose, capillary     Status: Abnormal   Collection Time: 03/11/16  4:17 PM  Result Value Ref Range   Glucose-Capillary 178 (H) 65 - 99 mg/dL   Comment 1 Notify RN    Comment 2 Document in Chart   Glucose, capillary     Status: Abnormal   Collection Time: 03/11/16  8:46 PM  Result Value Ref Range   Glucose-Capillary 135 (H) 65 - 99 mg/dL   Comment 1 Notify RN    Comment 2 Document in Chart   Glucose, capillary     Status: Abnormal   Collection Time: 03/12/16 12:24 AM  Result Value Ref Range   Glucose-Capillary 115 (H) 65 - 99 mg/dL   Comment 1 Notify RN    Comment 2 Document in Chart   Renal function panel     Status: Abnormal   Collection Time: 03/12/16  4:26 AM  Result Value Ref Range   Sodium 146 (H) 135 - 145 mmol/L   Potassium 3.6 3.5 - 5.1 mmol/L   Chloride 118 (H) 101 - 111  mmol/L   CO2 22 22 - 32 mmol/L   Glucose, Bld 105 (H) 65 - 99 mg/dL   BUN 73 (H) 6 - 20 mg/dL   Creatinine, Ser 6.07 (H) 0.61 - 1.24 mg/dL   Calcium 8.6 (L) 8.9 - 10.3 mg/dL   Phosphorus 3.9 2.5 - 4.6 mg/dL   Albumin 3.5 3.5 - 5.0 g/dL   GFR calc non Af Amer 9 (L) >60 mL/min   GFR calc Af Amer 10 (L) >60 mL/min    Comment: (NOTE) The eGFR has been calculated using the CKD EPI equation. This calculation has not been validated in all clinical situations. eGFR's persistently <60 mL/min signify possible Chronic Kidney Disease.    Anion gap 6 5 - 15  Glucose, capillary     Status: Abnormal   Collection Time: 03/12/16  4:49 AM  Result Value Ref Range   Glucose-Capillary 106 (H) 65 - 99 mg/dL  Glucose, capillary     Status: Abnormal   Collection Time: 03/12/16  7:08 AM  Result Value Ref Range   Glucose-Capillary 101 (H) 65 - 99 mg/dL   Comment 1 Notify RN    Comment 2 Document in Chart   Glucose, capillary     Status: Abnormal   Collection Time: 03/12/16 11:13  AM  Result Value Ref Range   Glucose-Capillary 159 (H) 65 - 99 mg/dL   Comment 1 Notify RN    Comment 2 Document in Chart   Glucose, capillary     Status: Abnormal   Collection Time: 03/12/16  4:06 PM  Result Value Ref Range   Glucose-Capillary 145 (H) 65 - 99 mg/dL   Comment 1 Notify RN    Comment 2 Document in Chart   Glucose, capillary     Status: Abnormal   Collection Time: 03/12/16  7:24 PM  Result Value Ref Range   Glucose-Capillary 141 (H) 65 - 99 mg/dL  Glucose, capillary     Status: Abnormal   Collection Time: 03/12/16  9:39 PM  Result Value Ref Range   Glucose-Capillary 129 (H) 65 - 99 mg/dL   Comment 1 Notify RN    Comment 2 Document in Chart   Glucose, capillary     Status: None   Collection Time: 03/12/16 11:54 PM  Result Value Ref Range   Glucose-Capillary 99 65 - 99 mg/dL   Comment 1 Notify RN    Comment 2 Document in Chart   MRSA PCR Screening     Status: None   Collection Time: 03/13/16  2:44 AM   Result Value Ref Range   MRSA by PCR NEGATIVE NEGATIVE    Comment:        The GeneXpert MRSA Assay (FDA approved for NASAL specimens only), is one component of a comprehensive MRSA colonization surveillance program. It is not intended to diagnose MRSA infection nor to guide or monitor treatment for MRSA infections.   Glucose, capillary     Status: Abnormal   Collection Time: 03/13/16  5:30 AM  Result Value Ref Range   Glucose-Capillary 138 (H) 65 - 99 mg/dL   Comment 1 Notify RN    Comment 2 Document in Chart     ABGS No results for input(s): PHART, PO2ART, TCO2, HCO3 in the last 72 hours.  Invalid input(s): PCO2 CULTURES Recent Results (from the past 240 hour(s))  Urine culture     Status: Abnormal   Collection Time: 03/10/16  4:05 AM  Result Value Ref Range Status   Specimen Description URINE, CLEAN CATCH  Final   Special Requests NONE  Final   Culture (A)  Final    <10,000 COLONIES/mL INSIGNIFICANT GROWTH Performed at [facility]    Report Status 03/11/2016 FINAL  Final  MRSA PCR Screening     Status: None   Collection Time: 03/13/16  2:44 AM  Result Value Ref Range Status   MRSA by PCR NEGATIVE NEGATIVE Final    Comment:        The GeneXpert MRSA Assay (FDA approved for NASAL specimens only), is one component of a comprehensive MRSA colonization surveillance program. It is not intended to diagnose MRSA infection nor to guide or monitor treatment for MRSA infections.    Studies/Results: No results found.  Medications: I have reviewed the patient's current medications.  Assesment:   Principal Problem:   Uremia Active Problems:   DM (diabetes mellitus), type 2, uncontrolled (HCC)   BPH (benign prostatic hypertrophy) with urinary retention   Anemia, chronic disease   Essential hypertension   Nausea & vomiting   End stage renal disease (HCC)   Hydronephrosis, bilateral   Acute on chronic renal failure (HCC)    Plan:  Medications  reviewed Continue current treatment Continue iv hydration Will continue to moniorBMP    LOS: 3 days  Sephora Boyar 03/13/2016, 8:10 AM

## 2016-03-13 NOTE — Progress Notes (Signed)
Subjective: Interval History: Patient presently denies any nausea or vomiting. He denies also any difficulty breathing.  Objective: Vital signs in last 24 hours: Temp:  [97.6 F (36.4 C)-99.2 F (37.3 C)] 99.2 F (37.3 C) (05/18 0015) Pulse Rate:  [95-106] 100 (05/18 0015) Resp:  [20] 20 (05/18 0015) BP: (128-164)/(68-89) 164/73 mmHg (05/18 0015) SpO2:  [99 %-100 %] 99 % (05/18 0015) Weight change:   Intake/Output from previous day: 05/17 0701 - 05/18 0700 In: 3356.7 [P.O.:240; I.V.:3116.7] Out: -  Intake/Output this shift:    Patient is alert and in no apparent distress Chest is clear to auscultation Heart exam regular rate and rhythm no murmur Extremities he has trace edema  Lab Results:  Recent Labs  03/11/16 0421  WBC 8.5  HGB 9.7*  HCT 30.1*  PLT 91*   BMET:   Recent Labs  03/11/16 0421 03/12/16 0426  NA 149* 146*  K 3.3* 3.6  CL 116* 118*  CO2 22 22  GLUCOSE 129* 105*  BUN 86* 73*  CREATININE 8.18* 6.07*  CALCIUM 8.7* 8.6*   No results for input(s): PTH in the last 72 hours. Iron Studies: No results for input(s): IRON, TIBC, TRANSFERRIN, FERRITIN in the last 72 hours.  Studies/Results: No results found.  I have reviewed the patient's current medications.  Assessment/Plan: Problem #1 acute kidney injury superimposed on chronic. He status post foley  catheter placement. His BUN and creatinine has improved significantly. Still his creatinine is above his baseline. Problem #2 chronic renal failure: Stage IV. Etiology thought to be secondary to diabetes/obstructive uropathy/hypertension Problem #3 hypokalemia: Potassium is 3.6 has normalized. Problem #4 hypernatremia: His sodium is improving Problem #5 hypertension: His blood pressure he seems to be reasonably controlled Problem #6 anemia: Hemoglobin possibly secondary to chronic renal failure. Presently his hemoglobin is stable. Problem #7  obstructive uropathy. Presently with catheter. Problem  #8. Metabolic bone disease: His calcium and phosphorus is range Plan: 1] will continue his present management. 2] will check renal panel in the morning   LOS: 3 days   Zachary [name] S 03/13/2016,8:30 AM

## 2016-03-14 ENCOUNTER — Encounter: Payer: Self-pay | Admitting: *Deleted

## 2016-03-14 LAB — GLUCOSE, CAPILLARY
Glucose-Capillary: 88 mg/dL (ref 65–99)
Glucose-Capillary: 76 mg/dL (ref 65–99)
Glucose-Capillary: 158 mg/dL — ABNORMAL HIGH (ref 65–99)
Glucose-Capillary: 129 mg/dL — ABNORMAL HIGH (ref 65–99)
Glucose-Capillary: 146 mg/dL — ABNORMAL HIGH (ref 65–99)

## 2016-03-14 LAB — BASIC METABOLIC PANEL
Anion gap: 4 — ABNORMAL LOW (ref 5–15)
BUN: 48 mg/dL — ABNORMAL HIGH (ref 6–20)
CO2: 20 mmol/L — ABNORMAL LOW (ref 22–32)
Calcium: 8.7 mg/dL — ABNORMAL LOW (ref 8.9–10.3)
Chloride: 122 mmol/L — ABNORMAL HIGH (ref 101–111)
Creatinine, Ser: 3.65 mg/dL — ABNORMAL HIGH (ref 0.61–1.24)
GFR calc Af Amer: 18 mL/min — ABNORMAL LOW (ref >60)
GFR calc non Af Amer: 16 mL/min — ABNORMAL LOW (ref >60)
Glucose, Bld: 83 mg/dL (ref 65–99)
Potassium: 4 mmol/L (ref 3.5–5.1)
Sodium: 146 mmol/L — ABNORMAL HIGH (ref 135–145)

## 2016-03-14 NOTE — Care Management Important Message (Signed)
Important Message  Patient Details  Name: KIAI PEACE MRN: [id number] Date of Birth: December 05, 1945   Medicare Important Message Given:  Yes    Navina Wohlers, Chauncey [place], RN 03/14/2016, 9:26 AM

## 2016-03-14 NOTE — Progress Notes (Signed)
Subjective: Patient is resting. He has foley catheter. He is being hydrated and improving. Objective: Vital signs in last 24 hours: Temp:  [98.9 F (37.2 C)-99.5 F (37.5 C)] 99.5 F (37.5 C) (05/19 0500) Pulse Rate:  [89-98] 98 (05/19 0500) Resp:  [18] 18 (05/19 0500) BP: (135-176)/(65-78) 176/78 mmHg (05/19 0500) SpO2:  [99 %-100 %] 100 % (05/19 0500) Weight change:  Last BM Date: 03/13/16  Intake/Output from previous day: 05/18 0701 - 05/19 0700 In: 720 [P.O.:720] Out: 952 [Urine:950; Stool:2]  PHYSICAL EXAM General appearance: alert and no distress Resp: diminished breath sounds bilaterally and rhonchi bilaterally Cardio: S1, S2 normal GI: soft, non-tender; bowel sounds normal; no masses,  no organomegaly Extremities: extremities normal, atraumatic, no cyanosis or edema  Lab Results:  Results for orders placed or performed during the hospital encounter of 03/10/16 (from the past 48 hour(s))  Glucose, capillary     Status: Abnormal   Collection Time: 03/12/16 11:13 AM  Result Value Ref Range   Glucose-Capillary 159 (H) 65 - 99 mg/dL   Comment 1 Notify RN    Comment 2 Document in Chart   Glucose, capillary     Status: Abnormal   Collection Time: 03/12/16  4:06 PM  Result Value Ref Range   Glucose-Capillary 145 (H) 65 - 99 mg/dL   Comment 1 Notify RN    Comment 2 Document in Chart   Glucose, capillary     Status: Abnormal   Collection Time: 03/12/16  7:24 PM  Result Value Ref Range   Glucose-Capillary 141 (H) 65 - 99 mg/dL  Glucose, capillary     Status: Abnormal   Collection Time: 03/12/16  9:39 PM  Result Value Ref Range   Glucose-Capillary 129 (H) 65 - 99 mg/dL   Comment 1 Notify RN    Comment 2 Document in Chart   Glucose, capillary     Status: None   Collection Time: 03/12/16 11:54 PM  Result Value Ref Range   Glucose-Capillary 99 65 - 99 mg/dL   Comment 1 Notify RN    Comment 2 Document in Chart   MRSA PCR Screening     Status: None   Collection Time:  03/13/16  2:44 AM  Result Value Ref Range   MRSA by PCR NEGATIVE NEGATIVE    Comment:        The GeneXpert MRSA Assay (FDA approved for NASAL specimens only), is one component of a comprehensive MRSA colonization surveillance program. It is not intended to diagnose MRSA infection nor to guide or monitor treatment for MRSA infections.   Glucose, capillary     Status: Abnormal   Collection Time: 03/13/16  5:30 AM  Result Value Ref Range   Glucose-Capillary 138 (H) 65 - 99 mg/dL   Comment 1 Notify RN    Comment 2 Document in Chart   Glucose, capillary     Status: Abnormal   Collection Time: 03/13/16  8:12 AM  Result Value Ref Range   Glucose-Capillary 101 (H) 65 - 99 mg/dL  Basic metabolic panel     Status: Abnormal   Collection Time: 03/13/16  8:54 AM  Result Value Ref Range   Sodium 148 (H) 135 - 145 mmol/L   Potassium 3.8 3.5 - 5.1 mmol/L   Chloride 122 (H) 101 - 111 mmol/L   CO2 21 (L) 22 - 32 mmol/L   Glucose, Bld 104 (H) 65 - 99 mg/dL   BUN 56 (H) 6 - 20 mg/dL   Creatinine, Ser 4.09 (  H) 0.61 - 1.24 mg/dL   Calcium 8.9 8.9 - 10.3 mg/dL   GFR calc non Af Amer 14 (L) >60 mL/min   GFR calc Af Amer 16 (L) >60 mL/min    Comment: (NOTE) The eGFR has been calculated using the CKD EPI equation. This calculation has not been validated in all clinical situations. eGFR's persistently <60 mL/min signify possible Chronic Kidney Disease.    Anion gap 5 5 - 15  Glucose, capillary     Status: Abnormal   Collection Time: 03/13/16 11:36 AM  Result Value Ref Range   Glucose-Capillary 123 (H) 65 - 99 mg/dL  Glucose, capillary     Status: Abnormal   Collection Time: 03/13/16  4:36 PM  Result Value Ref Range   Glucose-Capillary 133 (H) 65 - 99 mg/dL  Glucose, capillary     Status: Abnormal   Collection Time: 03/13/16  8:08 PM  Result Value Ref Range   Glucose-Capillary 131 (H) 65 - 99 mg/dL   Comment 1 Notify RN    Comment 2 Document in Chart   Glucose, capillary     Status:  Abnormal   Collection Time: 03/13/16 11:46 PM  Result Value Ref Range   Glucose-Capillary 105 (H) 65 - 99 mg/dL   Comment 1 Notify RN    Comment 2 Document in Chart   Glucose, capillary     Status: None   Collection Time: 03/14/16  4:09 AM  Result Value Ref Range   Glucose-Capillary 88 65 - 99 mg/dL   Comment 1 Notify RN    Comment 2 Document in Chart   Basic metabolic panel     Status: Abnormal   Collection Time: 03/14/16  5:24 AM  Result Value Ref Range   Sodium 146 (H) 135 - 145 mmol/L   Potassium 4.0 3.5 - 5.1 mmol/L   Chloride 122 (H) 101 - 111 mmol/L   CO2 20 (L) 22 - 32 mmol/L   Glucose, Bld 83 65 - 99 mg/dL   BUN 48 (H) 6 - 20 mg/dL   Creatinine, Ser 3.65 (H) 0.61 - 1.24 mg/dL   Calcium 8.7 (L) 8.9 - 10.3 mg/dL   GFR calc non Af Amer 16 (L) >60 mL/min   GFR calc Af Amer 18 (L) >60 mL/min    Comment: (NOTE) The eGFR has been calculated using the CKD EPI equation. This calculation has not been validated in all clinical situations. eGFR's persistently <60 mL/min signify possible Chronic Kidney Disease.    Anion gap 4 (L) 5 - 15    ABGS No results for input(s): PHART, PO2ART, TCO2, HCO3 in the last 72 hours.  Invalid input(s): PCO2 CULTURES Recent Results (from the past 240 hour(s))  Urine culture     Status: Abnormal   Collection Time: 03/10/16  4:05 AM  Result Value Ref Range Status   Specimen Description URINE, CLEAN CATCH  Final   Special Requests NONE  Final   Culture (A)  Final    <10,000 COLONIES/mL INSIGNIFICANT GROWTH Performed at [facility]    Report Status 03/11/2016 FINAL  Final  MRSA PCR Screening     Status: None   Collection Time: 03/13/16  2:44 AM  Result Value Ref Range Status   MRSA by PCR NEGATIVE NEGATIVE Final    Comment:        The GeneXpert MRSA Assay (FDA approved for NASAL specimens only), is one component of a comprehensive MRSA colonization surveillance program. It is not intended to diagnose MRSA  infection nor to  guide or monitor treatment for MRSA infections.    Studies/Results: No results found.  Medications: I have reviewed the patient's current medications.  Assesment:   Principal Problem:   Uremia Active Problems:   DM (diabetes mellitus), type 2, uncontrolled (HCC)   BPH (benign prostatic hypertrophy) with urinary retention   Anemia, chronic disease   Essential hypertension   Nausea & vomiting   End stage renal disease (HCC)   Hydronephrosis, bilateral   Acute on chronic renal failure ([place])    Plan:  Medications reviewed Continue current treatment Continue iv hydration Will continue to moniorBMP    LOS: 4 days   Zachary [name] 03/14/2016, 8:08 AM

## 2016-03-14 NOTE — Clinical Social Work Note (Signed)
CSW updated [place] and DSS guardian, [name] on pt. Possible d/c tomorrow per MD. Facility and guardian aware. CM to order home health if pt d/c with foley catheter.   Benay Pike, [place]

## 2016-03-14 NOTE — Progress Notes (Signed)
Subjective: Interval History: The patient offers no new complaints. His appetite still poor but no nausea or vomiting.  Objective: Vital signs in last 24 hours: Temp:  [98.9 F (37.2 C)-99.5 F (37.5 C)] 99.5 F (37.5 C) (05/19 0500) Pulse Rate:  [89-98] 98 (05/19 0500) Resp:  [18] 18 (05/19 0500) BP: (135-176)/(65-78) 176/78 mmHg (05/19 0500) SpO2:  [99 %-100 %] 100 % (05/19 0500) Weight change:   Intake/Output from previous day: 05/18 0701 - 05/19 0700 In: 720 [P.O.:720] Out: 952 [Urine:950; Stool:2] Intake/Output this shift:    Patient is alert and in no apparent distress Chest is clear to auscultation Heart exam regular rate and rhythm no murmur Extremities he has trace edema  Lab Results: No results for input(s): WBC, HGB, HCT, PLT in the last 72 hours. BMET:   Recent Labs  03/13/16 0854 03/14/16 0524  NA 148* 146*  K 3.8 4.0  CL 122* 122*  CO2 21* 20*  GLUCOSE 104* 83  BUN 56* 48*  CREATININE 4.09* 3.65*  CALCIUM 8.9 8.7*   No results for input(s): PTH in the last 72 hours. Iron Studies: No results for input(s): IRON, TIBC, TRANSFERRIN, FERRITIN in the last 72 hours.  Studies/Results: No results found.  I have reviewed the patient's current medications.  Assessment/Plan: Problem #1 acute kidney injury superimposed on chronic. His BUN and creatinine is improving progressively. Presently his creatinine is still lumbar his baseline. Problem #2 chronic renal failure: Stage IV. Etiology thought to be secondary to diabetes/obstructive uropathy/hypertension Problem #3 hypokalemia: Potassium is normal. Problem #4 hypernatremia: His sodium is improving Problem #5 hypertension: His blood pressure he seems to be reasonably controlled Problem #6 anemia: Hemoglobin possibly secondary to chronic renal failure. Presently his hemoglobin is stable. Problem #7  obstructive uropathy. Presently with catheter. Problem #8. Metabolic bone disease: His calcium and phosphorus  is range  Problem #9 metabolic acidosis: His CO2 is 20 stable Plan: 1] will continue his present management. 2] will check renal panel in the morning   LOS: 4 days   Daveyon Kitchings S 03/14/2016,9:17 AM

## 2016-03-14 NOTE — Progress Notes (Signed)
TABORIS TEDROW  MRN: [id number]  DOB/AGE: 04-20-1946 70 y.o.  Primary Care Physician:FANTA,TESFAYE, MD  Admit date: 03/10/2016  Chief Complaint:  Chief Complaint  Patient presents with  . Emesis    S-Pt presented on  03/10/2016 with  Chief Complaint  Patient presents with  . Emesis  .    Pt offers no new complaints .Pt says " I am little better"   Meds . carvedilol  6.25 mg Oral BID WC  . ferrous sulfate  325 mg Oral Q breakfast  . insulin aspart  0-9 Units Subcutaneous Q4H  . isosorbide-hydrALAZINE  1 tablet Oral TID  . levothyroxine  75 mcg Oral QAC breakfast  . pantoprazole  40 mg Oral Daily  . simvastatin  20 mg Oral QHS  . sodium chloride flush  3 mL Intravenous Q12H  . tamsulosin  0.4 mg Oral Daily     Physical Exam: Vital signs in last 24 hours: Temp:  [98.9 F (37.2 C)-99.5 F (37.5 C)] 99.5 F (37.5 C) (05/19 0500) Pulse Rate:  [89-98] 98 (05/19 0500) Resp:  [18] 18 (05/19 0500) BP: (135-176)/(65-78) 176/78 mmHg (05/19 0500) SpO2:  [99 %-100 %] 100 % (05/19 0500) Weight change:  Last BM Date: 03/13/16  Intake/Output from previous day: 05/18 0701 - 05/19 0700 In: 720 [P.O.:720] Out: 952 [Urine:950; Stool:2]     Physical Exam: General- pt is awake, follow commands Resp- No acute REsp distress, Rhonchi minimal CVS- S1S2 regular in rate and rhythm GIT- BS+, soft, NT, ND. EXT- NO LE Edema, NO Cyanosis Access- AVF +  Lab Results: CBC No results for input(s): WBC, HGB, HCT, PLT in the last 72 hours.  BMET  Recent Labs  03/13/16 0854 03/14/16 0524  NA 148* 146*  K 3.8 4.0  CL 122* 122*  CO2 21* 20*  GLUCOSE 104* 83  BUN 56* 48*  CREATININE 4.09* 3.65*  CALCIUM 8.9 8.7*   Trend Creat   2017 9.41=>8.18=>6.07    2016   4.87=>3.61-3.68       3.3--4.4( July /August admission)       6.67=>6.87=>6.34=>3.14( June admission)       3.69=>4.23=>3.67=>3.46--3.47( May admission) 2015   3.23==>1.67  MICRO Recent Results (from the past 240  hour(s))  Urine culture     Status: Abnormal   Collection Time: 03/10/16  4:05 AM  Result Value Ref Range Status   Specimen Description URINE, CLEAN CATCH  Final   Special Requests NONE  Final   Culture (A)  Final    <10,000 COLONIES/mL INSIGNIFICANT GROWTH Performed at [facility]    Report Status 03/11/2016 FINAL  Final  MRSA PCR Screening     Status: None   Collection Time: 03/13/16  2:44 AM  Result Value Ref Range Status   MRSA by PCR NEGATIVE NEGATIVE Final    Comment:        The GeneXpert MRSA Assay (FDA approved for NASAL specimens only), is one component of a comprehensive MRSA colonization surveillance program. It is not intended to diagnose MRSA infection nor to guide or monitor treatment for MRSA infections.       Lab Results  Component Value Date   PTH 61 10/09/2015   CALCIUM 8.7* 03/14/2016   CAION 1.24 10/08/2015   PHOS 3.9 03/12/2016          Renal u/s in 2015 1. Moderate bilateral hydronephrosis. There is also evidence of  hydroureter bilaterally. No obstructing masses nor appreciable  calculi identified. Clinical correlation recommended and  if  clinically warranted further evaluation with renal protocol CT is  recommended.  2. Trabeculated bladder wall and enlarged prostate. These findings  may reflect sequela of a component of bladder outlet obstruction.  Clinical correlation recommended.  Renal u/s in May 2016 Right Kidney 14.6 cm. Diffusely echogenic. No significant change in dilatation of the renal collecting system.  Left Kidney:: 13.6 cm. Diffusely echogenic. No significant change in dilatation of the renal collecting system.  Bladder:Distended, with a calculated prevoid volume of 1137 cc and postvoid volume of 836 cc. The previously seen bladder mass is no longer Visualized.  Renal U/s in June 2016 Right Kidney:  Length: 13.3 cm. Severe hydronephrosis similar to comparison exam. Increased renal cortical  echogenicity.  Left Kidney:  Length: 12.7 cm. Severe hydronephrosis with increased cortical echogenicity. No significant change from prior.  Bladder:  The bladder is distended with the large which postvoid residual of 1120 cc disease.  IMPRESSION: 1. No change in bilateral severe hydronephrosis. 2. No change in distended bladder with large postvoid residual.  CT abdomen May 2017 IMPRESSION: Bilateral hydronephrosis and hydroureter. Bladder wall thickening. Prostate gland enlarged. No stones identified. Changes could be due to cystitis and pyelonephritis or bladder outlet obstruction with bladder wall hypertrophy and reflux.   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 Phosphorus not  at goal.    5)CNS-Hx of mental Retardation  PMD following  6)Electrolytes  Hypokalemic  Hypernatremia  improving, now much better   7)Acid base Co2  at goal.   Plan:  Will continue current care Will rechcek BMEt   [facility] S 03/14/2016, 9:15 AM

## 2016-03-14 NOTE — Care Management Note (Signed)
Case Management Note  Patient Details  Name: ALEK OKUMURA MRN: [id number] Date of Birth: [birth year]  Expected Discharge Date:    03/15/2016              Expected Discharge Plan:  Assisted Living / Rest Home  In-House Referral:  NA  Discharge planning Services  CM Consult  Post Acute Care Choice:  Home Health Choice offered to:  NA  DME Arranged:    DME Agency:     HH Arranged:  RN [place] Agency:  [place]  Status of Service:  Completed, signed off  Medicare Important Message Given:  Yes Date Medicare IM Given:    Medicare IM give by:    Date Additional Medicare IM Given:    Additional Medicare Important Message give by:     If discussed at [place] of Stay Meetings, dates discussed:    Additional Comments: Anticipate DC back to ALF over weekend. Pt will DC with urinary catheter and HH has been ordered for management education of catheter. Pt has used [facility] in the past and this is facility preference. Romualdo Bolk, of [facility] made aware of referral and will obtain pt info from chart. Facility aware HH has 48 hours to initiate services.   Sherald Barge, RN 03/14/2016, 11:02 AM

## 2016-03-15 LAB — BASIC METABOLIC PANEL
Anion gap: 3 — ABNORMAL LOW (ref 5–15)
BUN: 39 mg/dL — ABNORMAL HIGH (ref 6–20)
CO2: 20 mmol/L — ABNORMAL LOW (ref 22–32)
Calcium: 8.6 mg/dL — ABNORMAL LOW (ref 8.9–10.3)
Chloride: 120 mmol/L — ABNORMAL HIGH (ref 101–111)
Creatinine, Ser: 3.18 mg/dL — ABNORMAL HIGH (ref 0.61–1.24)
GFR calc Af Amer: 22 mL/min — ABNORMAL LOW (ref >60)
GFR calc non Af Amer: 19 mL/min — ABNORMAL LOW (ref >60)
Glucose, Bld: 96 mg/dL (ref 65–99)
Potassium: 4 mmol/L (ref 3.5–5.1)
Sodium: 143 mmol/L (ref 135–145)

## 2016-03-15 LAB — GLUCOSE, CAPILLARY
Glucose-Capillary: 108 mg/dL — ABNORMAL HIGH (ref 65–99)
Glucose-Capillary: 115 mg/dL — ABNORMAL HIGH (ref 65–99)
Glucose-Capillary: 106 mg/dL — ABNORMAL HIGH (ref 65–99)
Glucose-Capillary: 127 mg/dL — ABNORMAL HIGH (ref 65–99)
Glucose-Capillary: 137 mg/dL — ABNORMAL HIGH (ref 65–99)
Glucose-Capillary: 159 mg/dL — ABNORMAL HIGH (ref 65–99)

## 2016-03-15 NOTE — Progress Notes (Signed)
Subjective: Patient is awake but sick looking. He is on IV fluid. His renal function is improving. His appetite remained poor. Objective: Vital signs in last 24 hours: Temp:  [97.3 F (36.3 C)-98.9 F (37.2 C)] 98 F (36.7 C) (05/20 0630) Pulse Rate:  [81-99] 82 (05/20 0630) Resp:  [18-20] 18 (05/20 0630) BP: (136-174)/(65-82) 161/82 mmHg (05/20 0630) SpO2:  [98 %-99 %] 98 % (05/20 0630) Weight change:  Last BM Date: 03/13/16  Intake/Output from previous day: 05/19 0701 - 05/20 0700 In: -  Out: 400 [Urine:400]  PHYSICAL EXAM General appearance: alert and no distress Resp: diminished breath sounds bilaterally and rhonchi bilaterally Cardio: S1, S2 normal GI: soft, non-tender; bowel sounds normal; no masses,  no organomegaly Extremities: extremities normal, atraumatic, no cyanosis or edema  Lab Results:  Results for orders placed or performed during the hospital encounter of 03/10/16 (from the past 48 hour(s))  Glucose, capillary     Status: Abnormal   Collection Time: 03/13/16 11:36 AM  Result Value Ref Range   Glucose-Capillary 123 (H) 65 - 99 mg/dL  Glucose, capillary     Status: Abnormal   Collection Time: 03/13/16  4:36 PM  Result Value Ref Range   Glucose-Capillary 133 (H) 65 - 99 mg/dL  Glucose, capillary     Status: Abnormal   Collection Time: 03/13/16  8:08 PM  Result Value Ref Range   Glucose-Capillary 131 (H) 65 - 99 mg/dL   Comment 1 Notify RN    Comment 2 Document in Chart   Glucose, capillary     Status: Abnormal   Collection Time: 03/13/16 11:46 PM  Result Value Ref Range   Glucose-Capillary 105 (H) 65 - 99 mg/dL   Comment 1 Notify RN    Comment 2 Document in Chart   Glucose, capillary     Status: None   Collection Time: 03/14/16  4:09 AM  Result Value Ref Range   Glucose-Capillary 88 65 - 99 mg/dL   Comment 1 Notify RN    Comment 2 Document in Chart   Basic metabolic panel     Status: Abnormal   Collection Time: 03/14/16  5:24 AM  Result Value  Ref Range   Sodium 146 (H) 135 - 145 mmol/L   Potassium 4.0 3.5 - 5.1 mmol/L   Chloride 122 (H) 101 - 111 mmol/L   CO2 20 (L) 22 - 32 mmol/L   Glucose, Bld 83 65 - 99 mg/dL   BUN 48 (H) 6 - 20 mg/dL   Creatinine, Ser 3.65 (H) 0.61 - 1.24 mg/dL   Calcium 8.7 (L) 8.9 - 10.3 mg/dL   GFR calc non Af Amer 16 (L) >60 mL/min   GFR calc Af Amer 18 (L) >60 mL/min    Comment: (NOTE) The eGFR has been calculated using the CKD EPI equation. This calculation has not been validated in all clinical situations. eGFR's persistently <60 mL/min signify possible Chronic Kidney Disease.    Anion gap 4 (L) 5 - 15  Glucose, capillary     Status: None   Collection Time: 03/14/16  7:40 AM  Result Value Ref Range   Glucose-Capillary 76 65 - 99 mg/dL   Comment 1 Notify RN    Comment 2 Document in Chart   Glucose, capillary     Status: Abnormal   Collection Time: 03/14/16 11:55 AM  Result Value Ref Range   Glucose-Capillary 158 (H) 65 - 99 mg/dL   Comment 1 Notify RN    Comment 2 Document  in Chart   Glucose, capillary     Status: Abnormal   Collection Time: 03/14/16  5:23 PM  Result Value Ref Range   Glucose-Capillary 129 (H) 65 - 99 mg/dL   Comment 1 Notify RN    Comment 2 Document in Chart   Glucose, capillary     Status: Abnormal   Collection Time: 03/14/16  8:16 PM  Result Value Ref Range   Glucose-Capillary 146 (H) 65 - 99 mg/dL  Glucose, capillary     Status: Abnormal   Collection Time: 03/15/16 12:22 AM  Result Value Ref Range   Glucose-Capillary 115 (H) 65 - 99 mg/dL  Glucose, capillary     Status: Abnormal   Collection Time: 03/15/16  4:32 AM  Result Value Ref Range   Glucose-Capillary 108 (H) 65 - 99 mg/dL  Basic metabolic panel     Status: Abnormal   Collection Time: 03/15/16  6:02 AM  Result Value Ref Range   Sodium 143 135 - 145 mmol/L   Potassium 4.0 3.5 - 5.1 mmol/L   Chloride 120 (H) 101 - 111 mmol/L   CO2 20 (L) 22 - 32 mmol/L   Glucose, Bld 96 65 - 99 mg/dL   BUN 39 (H)  6 - 20 mg/dL   Creatinine, Ser 3.18 (H) 0.61 - 1.24 mg/dL   Calcium 8.6 (L) 8.9 - 10.3 mg/dL   GFR calc non Af Amer 19 (L) >60 mL/min   GFR calc Af Amer 22 (L) >60 mL/min    Comment: (NOTE) The eGFR has been calculated using the CKD EPI equation. This calculation has not been validated in all clinical situations. eGFR's persistently <60 mL/min signify possible Chronic Kidney Disease.    Anion gap 3 (L) 5 - 15  Glucose, capillary     Status: Abnormal   Collection Time: 03/15/16  7:59 AM  Result Value Ref Range   Glucose-Capillary 106 (H) 65 - 99 mg/dL   Comment 1 Notify RN     ABGS No results for input(s): PHART, PO2ART, TCO2, HCO3 in the last 72 hours.  Invalid input(s): PCO2 CULTURES Recent Results (from the past 240 hour(s))  Urine culture     Status: Abnormal   Collection Time: 03/10/16  4:05 AM  Result Value Ref Range Status   Specimen Description URINE, CLEAN CATCH  Final   Special Requests NONE  Final   Culture (A)  Final    <10,000 COLONIES/mL INSIGNIFICANT GROWTH Performed at [facility]    Report Status 03/11/2016 FINAL  Final  MRSA PCR Screening     Status: None   Collection Time: 03/13/16  2:44 AM  Result Value Ref Range Status   MRSA by PCR NEGATIVE NEGATIVE Final    Comment:        The GeneXpert MRSA Assay (FDA approved for NASAL specimens only), is one component of a comprehensive MRSA colonization surveillance program. It is not intended to diagnose MRSA infection nor to guide or monitor treatment for MRSA infections.    Studies/Results: No results found.  Medications: I have reviewed the patient's current medications.  Assesment:   Principal Problem:   Uremia Active Problems:   DM (diabetes mellitus), type 2, uncontrolled (HCC)   BPH (benign prostatic hypertrophy) with urinary retention   Anemia, chronic disease   Essential hypertension   Nausea & vomiting   End stage renal disease (HCC)   Hydronephrosis, bilateral   Acute on  chronic renal failure (HCC)    Plan:  Medications reviewed  Continue current treatment Continue iv hydration Will continue to moniorBMP    LOS: 5 days   Zachary [name],Zachary [name] 03/15/2016, 9:15 AM

## 2016-03-15 NOTE — Progress Notes (Signed)
Subjective: Interval History: Patient presently denies any difficulty breathing, hypotension getting better, he didn't have any nausea or vomiting..  Objective: Vital signs in last 24 hours: Temp:  [97.3 F (36.3 C)-98.9 F (37.2 C)] 98 F (36.7 C) (05/20 0630) Pulse Rate:  [81-99] 82 (05/20 0630) Resp:  [18-20] 18 (05/20 0630) BP: (136-174)/(65-82) 161/82 mmHg (05/20 0630) SpO2:  [98 %-99 %] 98 % (05/20 0630) Weight change:   Intake/Output from previous day: 05/19 0701 - 05/20 0700 In: -  Out: 400 [Urine:400] Intake/Output this shift:    Patient is alert and in no apparent distress Chest is clear to auscultation Heart exam regular rate and rhythm no murmur Extremities he has trace edema  Lab Results: No results for input(s): WBC, HGB, HCT, PLT in the last 72 hours. BMET:   Recent Labs  03/14/16 0524 03/15/16 0602  NA 146* 143  K 4.0 4.0  CL 122* 120*  CO2 20* 20*  GLUCOSE 83 96  BUN 48* 39*  CREATININE 3.65* 3.18*  CALCIUM 8.7* 8.6*   No results for input(s): PTH in the last 72 hours. Iron Studies: No results for input(s): IRON, TIBC, TRANSFERRIN, FERRITIN in the last 72 hours.  Studies/Results: No results found.  I have reviewed the patient's current medications.  Assessment/Plan: Problem #1 acute kidney injury superimposed on chronic.His renal function continued to improve. Presently patient is asymptomatic. Patient remained nonoliguric. Problem #2 chronic renal failure: Stage IV. Etiology thought to be secondary to diabetes/obstructive uropathy/hypertension Problem #3 hypokalemia: Potassium is normal. Problem #4 hypernatremia: His sodium is normal. Problem #5 hypertension: His blood pressure he seems to be reasonably controlled Problem #6 anemia: Hemoglobin possibly secondary to chronic renal failure. Presently his hemoglobin is stable. Patient presently on by mouth iron Problem #7  obstructive uropathy. Presently with catheter. This is a recurrent  issue. Problem #8. Metabolic bone disease: His calcium and phosphorus is range  Problem #9 metabolic acidosis: His CO2 is 20 stable. Diet controlled. Problem #10 hypothyroidism: Patient is on Synthroid Plan: 1] will continue his present management. 2] will check renal panel in the morning   LOS: 5 days   Sarin [place]un[place] S 03/15/2016,9:45 AM

## 2016-03-16 LAB — RENAL FUNCTION PANEL
Albumin: 2.8 g/dL — ABNORMAL LOW (ref 3.5–5.0)
Anion gap: 4 — ABNORMAL LOW (ref 5–15)
BUN: 32 mg/dL — ABNORMAL HIGH (ref 6–20)
CO2: 18 mmol/L — ABNORMAL LOW (ref 22–32)
Calcium: 8.5 mg/dL — ABNORMAL LOW (ref 8.9–10.3)
Chloride: 119 mmol/L — ABNORMAL HIGH (ref 101–111)
Creatinine, Ser: 2.69 mg/dL — ABNORMAL HIGH (ref 0.61–1.24)
GFR calc Af Amer: 27 mL/min — ABNORMAL LOW (ref >60)
GFR calc non Af Amer: 23 mL/min — ABNORMAL LOW (ref >60)
Glucose, Bld: 109 mg/dL — ABNORMAL HIGH (ref 65–99)
Phosphorus: 1.7 mg/dL — ABNORMAL LOW (ref 2.5–4.6)
Potassium: 4.1 mmol/L (ref 3.5–5.1)
Sodium: 141 mmol/L (ref 135–145)

## 2016-03-16 LAB — GLUCOSE, CAPILLARY
Glucose-Capillary: 106 mg/dL — ABNORMAL HIGH (ref 65–99)
Glucose-Capillary: 120 mg/dL — ABNORMAL HIGH (ref 65–99)
Glucose-Capillary: 98 mg/dL (ref 65–99)
Glucose-Capillary: 89 mg/dL (ref 65–99)
Glucose-Capillary: 87 mg/dL (ref 65–99)
Glucose-Capillary: 128 mg/dL — ABNORMAL HIGH (ref 65–99)

## 2016-03-16 LAB — CBC
HCT: 22.9 % — ABNORMAL LOW (ref 39.0–52.0)
Hemoglobin: 7.6 g/dL — ABNORMAL LOW (ref 13.0–17.0)
MCH: 33.3 pg (ref 26.0–34.0)
MCHC: 33.2 g/dL (ref 30.0–36.0)
MCV: 100.4 fL — ABNORMAL HIGH (ref 78.0–100.0)
Platelets: 47 10*3/uL — ABNORMAL LOW (ref 150–400)
RBC: 2.28 MIL/uL — ABNORMAL LOW (ref 4.22–5.81)
RDW: 14.6 % (ref 11.5–15.5)
WBC: 5.1 10*3/uL (ref 4.0–10.5)

## 2016-03-16 NOTE — Progress Notes (Signed)
Subjective: Patient is resting. His renal function is progressively improving with IV hydrastion. Objective: Vital signs in last 24 hours: Temp:  [98 F (36.7 C)-98.8 F (37.1 C)] 98.7 F (37.1 C) (05/21 1007) Pulse Rate:  [92-102] 95 (05/21 1007) Resp:  [18-20] 20 (05/21 1007) BP: (134-154)/(71-88) 152/88 mmHg (05/21 1007) SpO2:  [99 %-100 %] 100 % (05/21 1007) Weight change:  Last BM Date: 03/13/16  Intake/Output from previous day: 05/20 0701 - 05/21 0700 In: -  Out: 500 [Urine:500]  PHYSICAL EXAM General appearance: alert and no distress Resp: diminished breath sounds bilaterally and rhonchi bilaterally Cardio: S1, S2 normal GI: soft, non-tender; bowel sounds normal; no masses,  no organomegaly Extremities: extremities normal, atraumatic, no cyanosis or edema  Lab Results:  Results for orders placed or performed during the hospital encounter of 03/10/16 (from the past 48 hour(s))  Glucose, capillary     Status: Abnormal   Collection Time: 03/14/16 11:55 AM  Result Value Ref Range   Glucose-Capillary 158 (H) 65 - 99 mg/dL   Comment 1 Notify RN    Comment 2 Document in Chart   Glucose, capillary     Status: Abnormal   Collection Time: 03/14/16  5:23 PM  Result Value Ref Range   Glucose-Capillary 129 (H) 65 - 99 mg/dL   Comment 1 Notify RN    Comment 2 Document in Chart   Glucose, capillary     Status: Abnormal   Collection Time: 03/14/16  8:16 PM  Result Value Ref Range   Glucose-Capillary 146 (H) 65 - 99 mg/dL  Glucose, capillary     Status: Abnormal   Collection Time: 03/15/16 12:22 AM  Result Value Ref Range   Glucose-Capillary 115 (H) 65 - 99 mg/dL  Glucose, capillary     Status: Abnormal   Collection Time: 03/15/16  4:32 AM  Result Value Ref Range   Glucose-Capillary 108 (H) 65 - 99 mg/dL  Basic metabolic panel     Status: Abnormal   Collection Time: 03/15/16  6:02 AM  Result Value Ref Range   Sodium 143 135 - 145 mmol/L   Potassium 4.0 3.5 - 5.1 mmol/L    Chloride 120 (H) 101 - 111 mmol/L   CO2 20 (L) 22 - 32 mmol/L   Glucose, Bld 96 65 - 99 mg/dL   BUN 39 (H) 6 - 20 mg/dL   Creatinine, Ser 3.18 (H) 0.61 - 1.24 mg/dL   Calcium 8.6 (L) 8.9 - 10.3 mg/dL   GFR calc non Af Amer 19 (L) >60 mL/min   GFR calc Af Amer 22 (L) >60 mL/min    Comment: (NOTE) The eGFR has been calculated using the CKD EPI equation. This calculation has not been validated in all clinical situations. eGFR's persistently <60 mL/min signify possible Chronic Kidney Disease.    Anion gap 3 (L) 5 - 15  Glucose, capillary     Status: Abnormal   Collection Time: 03/15/16  7:59 AM  Result Value Ref Range   Glucose-Capillary 106 (H) 65 - 99 mg/dL   Comment 1 Notify RN   Glucose, capillary     Status: Abnormal   Collection Time: 03/15/16 11:24 AM  Result Value Ref Range   Glucose-Capillary 127 (H) 65 - 99 mg/dL   Comment 1 Notify RN   Glucose, capillary     Status: Abnormal   Collection Time: 03/15/16  4:23 PM  Result Value Ref Range   Glucose-Capillary 137 (H) 65 - 99 mg/dL   Comment 1  Notify RN   Glucose, capillary     Status: Abnormal   Collection Time: 03/15/16  9:03 PM  Result Value Ref Range   Glucose-Capillary 159 (H) 65 - 99 mg/dL   Comment 1 Notify RN    Comment 2 Document in Chart   Glucose, capillary     Status: Abnormal   Collection Time: 03/16/16  1:56 AM  Result Value Ref Range   Glucose-Capillary 106 (H) 65 - 99 mg/dL  Glucose, capillary     Status: Abnormal   Collection Time: 03/16/16  3:43 AM  Result Value Ref Range   Glucose-Capillary 120 (H) 65 - 99 mg/dL  CBC     Status: Abnormal   Collection Time: 03/16/16  6:19 AM  Result Value Ref Range   WBC 5.1 4.0 - 10.5 K/uL   RBC 2.28 (L) 4.22 - 5.81 MIL/uL   Hemoglobin 7.6 (L) 13.0 - 17.0 g/dL   HCT 22.9 (L) 39.0 - 52.0 %   MCV 100.4 (H) 78.0 - 100.0 fL   MCH 33.3 26.0 - 34.0 pg   MCHC 33.2 30.0 - 36.0 g/dL   RDW 14.6 11.5 - 15.5 %   Platelets 47 (L) 150 - 400 K/uL    Comment: SPECIMEN  CHECKED FOR CLOTS PLATELET COUNT CONFIRMED BY SMEAR   Renal function panel     Status: Abnormal   Collection Time: 03/16/16  6:19 AM  Result Value Ref Range   Sodium 141 135 - 145 mmol/L   Potassium 4.1 3.5 - 5.1 mmol/L   Chloride 119 (H) 101 - 111 mmol/L   CO2 18 (L) 22 - 32 mmol/L   Glucose, Bld 109 (H) 65 - 99 mg/dL   BUN 32 (H) 6 - 20 mg/dL   Creatinine, Ser 2.69 (H) 0.61 - 1.24 mg/dL   Calcium 8.5 (L) 8.9 - 10.3 mg/dL   Phosphorus 1.7 (L) 2.5 - 4.6 mg/dL   Albumin 2.8 (L) 3.5 - 5.0 g/dL   GFR calc non Af Amer 23 (L) >60 mL/min   GFR calc Af Amer 27 (L) >60 mL/min    Comment: (NOTE) The eGFR has been calculated using the CKD EPI equation. This calculation has not been validated in all clinical situations. eGFR's persistently <60 mL/min signify possible Chronic Kidney Disease.    Anion gap 4 (L) 5 - 15  Glucose, capillary     Status: None   Collection Time: 03/16/16  7:47 AM  Result Value Ref Range   Glucose-Capillary 98 65 - 99 mg/dL   Comment 1 Notify RN     ABGS No results for input(s): PHART, PO2ART, TCO2, HCO3 in the last 72 hours.  Invalid input(s): PCO2 CULTURES Recent Results (from the past 240 hour(s))  Urine culture     Status: Abnormal   Collection Time: 03/10/16  4:05 AM  Result Value Ref Range Status   Specimen Description URINE, CLEAN CATCH  Final   Special Requests NONE  Final   Culture (A)  Final    <10,000 COLONIES/mL INSIGNIFICANT GROWTH Performed at [facility]    Report Status 03/11/2016 FINAL  Final  MRSA PCR Screening     Status: None   Collection Time: 03/13/16  2:44 AM  Result Value Ref Range Status   MRSA by PCR NEGATIVE NEGATIVE Final    Comment:        The GeneXpert MRSA Assay (FDA approved for NASAL specimens only), is one component of a comprehensive MRSA colonization surveillance program. It is not  intended to diagnose MRSA infection nor to guide or monitor treatment for MRSA infections.    Studies/Results: No  results found.  Medications: I have reviewed the patient's current medications.  Assesment:   Principal Problem:   Uremia Active Problems:   DM (diabetes mellitus), type 2, uncontrolled (HCC)   BPH (benign prostatic hypertrophy) with urinary retention   Anemia, chronic disease   Essential hypertension   Nausea & vomiting   End stage renal disease (HCC)   Hydronephrosis, bilateral   Acute on chronic renal failure ([place])    Plan:  Medications reviewed Continue current treatment Continue iv hydration Will continue to moniorBMP    LOS: 6 days   Zachary [name] 03/16/2016, 11:18 AM

## 2016-03-16 NOTE — Progress Notes (Signed)
Subjective: Interval History: Patient offers no complaints. He denies any nausea or vomiting.  Objective: Vital signs in last 24 hours: Temp:  [98 F (36.7 C)-98.8 F (37.1 C)] 98.7 F (37.1 C) (05/21 0607) Pulse Rate:  [92-102] 92 (05/21 0607) Resp:  [16-18] 18 (05/21 0607) BP: (134-154)/(71-91) 135/78 mmHg (05/21 0607) SpO2:  [98 %-100 %] 100 % (05/21 0607) Weight change:   Intake/Output from previous day: 05/20 0701 - 05/21 0700 In: -  Out: 500 [Urine:500] Intake/Output this shift:    Patient is alert and in no apparent distress Chest is clear to auscultation Heart exam regular rate and rhythm no murmur Extremities he has trace edema  Lab Results:  Recent Labs  03/16/16 0619  WBC 5.1  HGB 7.6*  HCT 22.9*  PLT 47*   BMET:   Recent Labs  03/15/16 0602 03/16/16 0619  NA 143 141  K 4.0 4.1  CL 120* 119*  CO2 20* 18*  GLUCOSE 96 109*  BUN 39* 32*  CREATININE 3.18* 2.69*  CALCIUM 8.6* 8.5*   No results for input(s): PTH in the last 72 hours. Iron Studies: No results for input(s): IRON, TIBC, TRANSFERRIN, FERRITIN in the last 72 hours.  Studies/Results: No results found.  I have reviewed the patient's current medications.  Assessment/Plan: Problem #1 acute kidney injury superimposed on chronic.His renal function continued to improve. His creatinine is 2.69 and he is reaching his baseline. Problem #2 chronic renal failure: Stage IV. Etiology thought to be secondary to diabetes/obstructive uropathy/hypertension Problem #3 hypokalemia: Potassium is normal. Problem #4 hypernatremia: His sodium has corrected. Problem #5 hypertension: His blood pressure he seems to be reasonably controlled Problem #6 anemia: Hemoglobin possibly secondary to chronic renal failure. His hemoglobin is declining. Patient on iron supplement. Problem #7  obstructive uropathy. Presently with catheter. This is a recurrent issue. Problem #8. Metabolic bone disease: His calcium and  phosphorus is range  Problem #9 metabolic acidosis: His CO2 is has declined. Problem #10 hypothyroidism: Patient is on Synthroid Plan: 1] will check iron studies today 2] will check renal panel and CBC in the morning 3] his hemoglobin declines further patient may require blood transfusion.   LOS: 6 days   Zachary [name] S 03/16/2016,9:22 AM

## 2016-03-17 LAB — RENAL FUNCTION PANEL
Albumin: 2.7 g/dL — ABNORMAL LOW (ref 3.5–5.0)
BUN: 27 mg/dL — ABNORMAL HIGH (ref 6–20)
CO2: 18 mmol/L — ABNORMAL LOW (ref 22–32)
Calcium: 8.6 mg/dL — ABNORMAL LOW (ref 8.9–10.3)
Chloride: 119 mmol/L — ABNORMAL HIGH (ref 101–111)
Creatinine, Ser: 2.56 mg/dL — ABNORMAL HIGH (ref 0.61–1.24)
GFR calc Af Amer: 28 mL/min — ABNORMAL LOW (ref >60)
GFR calc non Af Amer: 24 mL/min — ABNORMAL LOW (ref >60)
Glucose, Bld: 78 mg/dL (ref 65–99)
Phosphorus: 1.8 mg/dL — ABNORMAL LOW (ref 2.5–4.6)
Potassium: 4.4 mmol/L (ref 3.5–5.1)
Sodium: 139 mmol/L (ref 135–145)

## 2016-03-17 LAB — CBC
HCT: 21.5 % — ABNORMAL LOW (ref 39.0–52.0)
Hemoglobin: 7.2 g/dL — ABNORMAL LOW (ref 13.0–17.0)
MCH: 33.3 pg (ref 26.0–34.0)
MCHC: 33.5 g/dL (ref 30.0–36.0)
MCV: 99.5 fL (ref 78.0–100.0)
Platelets: 50 10*3/uL — ABNORMAL LOW (ref 150–400)
RBC: 2.16 MIL/uL — ABNORMAL LOW (ref 4.22–5.81)
RDW: 14.5 % (ref 11.5–15.5)
WBC: 4.4 10*3/uL (ref 4.0–10.5)

## 2016-03-17 LAB — GLUCOSE, CAPILLARY
Glucose-Capillary: 148 mg/dL — ABNORMAL HIGH (ref 65–99)
Glucose-Capillary: 79 mg/dL (ref 65–99)
Glucose-Capillary: 86 mg/dL (ref 65–99)
Glucose-Capillary: 92 mg/dL (ref 65–99)

## 2016-03-17 LAB — IRON AND TIBC
Iron: 20 ug/dL — ABNORMAL LOW (ref 45–182)
Saturation Ratios: 11 % — ABNORMAL LOW (ref 17.9–39.5)
TIBC: 189 ug/dL — ABNORMAL LOW (ref 250–450)
UIBC: 169 ug/dL

## 2016-03-17 LAB — FERRITIN: Ferritin: 27 ng/mL (ref 24–336)

## 2016-03-17 NOTE — Progress Notes (Signed)
Subjective: Interval History: He denies any nausea or vomiting. Appetite is also much better.  Objective: Vital signs in last 24 hours: Temp:  [98 F (36.7 C)-98.7 F (37.1 C)] 98.4 F (36.9 C) (05/22 0421) Pulse Rate:  [92-101] 92 (05/22 0421) Resp:  [20] 20 (05/22 0421) BP: (132-160)/(78-88) 132/78 mmHg (05/22 0421) SpO2:  [97 %-100 %] 97 % (05/22 0421) Weight change:   Intake/Output from previous day: 05/21 0701 - 05/22 0700 In: 480 [P.O.:480] Out: 1650 [Urine:1650] Intake/Output this shift:    Patient is alert and in no apparent distress Chest is clear to auscultation Heart exam regular rate and rhythm no murmur Extremities he has no edema  Lab Results:  Recent Labs  03/16/16 0619 03/17/16 0545  WBC 5.1 4.4  HGB 7.6* 7.2*  HCT 22.9* 21.5*  PLT 47* 50*   BMET:   Recent Labs  03/16/16 0619 03/17/16 0545  NA 141 139  K 4.1 4.4  CL 119* 119*  CO2 18* 18*  GLUCOSE 109* 78  BUN 32* 27*  CREATININE 2.69* 2.56*  CALCIUM 8.5* 8.6*   No results for input(s): PTH in the last 72 hours. Iron Studies: No results for input(s): IRON, TIBC, TRANSFERRIN, FERRITIN in the last 72 hours.  Studies/Results: No results found.  I have reviewed the patient's current medications.  Assessment/Plan: Problem #1 acute kidney injury superimposed on chronic.His renal function continued to improve. His renal function is returning to his baseline. Presently patient remains nonoliguric. Problem #2 chronic renal failure: Stage IV. Etiology thought to be secondary to diabetes/obstructive uropathy/hypertension Problem #3 hypokalemia: Potassium is normal. Problem #4 hypernatremia: His sodium has corrected. Problem #5 hypertension: His blood pressure he seems to be reasonably controlled Problem #6 anemia: Hemoglobin possibly secondary to chronic renal failure. His hemoglobin is declining. Patient on iron supplement.. Presently iron studies are pending. Problem #7  obstructive uropathy.  Presently with catheter. This is a recurrent issue. Problem #8. Metabolic bone disease: His calcium and phosphorus is range  Problem #9 metabolic acidosis: Stable. Problem #10 hypothyroidism: Patient is on Synthroid Plan: 1] will continue his present management. 2] will see patient in 2-3 weeks as an outpatient.   LOS: 7 days   Sani Madariaga S 03/17/2016,9:10 AM

## 2016-03-17 NOTE — Care Management Important Message (Signed)
Important Message  Patient Details  Name: Zachary [name] MRN: [id number] Date of Birth: [birth year]   Medicare Important Message Given:  Yes    Weronika Birch, Chauncey [place], RN 03/17/2016, 9:08 AM

## 2016-03-17 NOTE — Discharge Summary (Signed)
Physician Discharge Summary  Patient ID: Zachary [name] MRN: [id number] DOB/AGE: [birth year] 68 y.o. Primary Care Physician:Kesler Wickham, MD Admit date: 03/10/2016 Discharge date: 03/17/2016    Discharge Diagnoses:   Principal Problem:   Uremia Active Problems:   DM (diabetes mellitus), type 2, uncontrolled (HCC)   BPH (benign prostatic hypertrophy) with urinary retention   Anemia, chronic disease   Essential hypertension   Nausea & vomiting   End stage renal disease (HCC)   Hydronephrosis, bilateral   Acute on chronic renal failure (HCC)     Medication List    STOP taking these medications        torsemide 20 MG tablet  Commonly known as:  DEMADEX      TAKE these medications        carvedilol 6.25 MG tablet  Commonly known as:  COREG  Take 1 tablet (6.25 mg total) by mouth 2 (two) times daily with a meal.     ferrous sulfate 325 (65 FE) MG tablet  Take 325 mg by mouth daily with breakfast.     isosorbide-hydrALAZINE 20-37.5 MG tablet  Commonly known as:  BIDIL  Take 1 tablet by mouth 3 (three) times daily.     levothyroxine 75 MCG tablet  Commonly known as:  SYNTHROID, LEVOTHROID  Take 75 mcg by mouth daily before breakfast.     linagliptin 5 MG Tabs tablet  Commonly known as:  TRADJENTA  Take 5 mg by mouth daily.     niacin 1000 MG CR tablet  Commonly known as:  NIASPAN  Take 1,000 mg by mouth at bedtime.     omeprazole 20 MG capsule  Commonly known as:  PRILOSEC  Take 40 mg by mouth daily.     ondansetron 4 mg Tabs tablet  Commonly known as:  ZOFRAN  Take 1 tablet by mouth every 8 hours when necessary nausea.     oxyCODONE-acetaminophen 5-325 MG tablet  Commonly known as:  ROXICET  Take 1 tablet by mouth every 6 (six) hours as needed.     simvastatin 20 MG tablet  Commonly known as:  ZOCOR  Take 20 mg by mouth at bedtime.     sodium polystyrene 15 GM/60ML suspension  Commonly known as:  KAYEXALATE  Take 30 g by mouth every Monday,  Wednesday, and Friday. Drinks entire content of 1 bottle (30 gm) once daily on Monday/Wednesday/Friday     tamsulosin 0.4 MG Caps capsule  Commonly known as:  FLOMAX  Take 0.4 mg by mouth daily.     Vitamin D (Ergocalciferol) 50000 units Caps capsule  Commonly known as:  DRISDOL  Take 1 capsule (50,000 Units total) by mouth every 7 (seven) days.        Discharged Condition: improved    Consults: nephrology/urology  Significant Diagnostic Studies: Ct Abdomen Pelvis Wo Contrast  03/10/2016  CLINICAL DATA:  Right upper quadrant and left upper quadrant abdominal pain with nausea for 1 week. EXAM: CT ABDOMEN AND PELVIS WITHOUT CONTRAST TECHNIQUE: Multidetector CT imaging of the abdomen and pelvis was performed following the standard protocol without IV contrast. COMPARISON:  09/13/2015 FINDINGS: Mild dependent changes in the lung bases. Coronary artery calcifications. Small pericardial effusion. There is bilateral hydronephrosis and hydroureter with stranding around the kidneys. No renal, ureteral, or bladder stones are identified. Bladder wall is diffusely thickened. Prostate gland is enlarged at 5.2 x 5.5 cm in diameter. Changes could be due to cystitis and pyelonephritis or bladder outlet obstruction with bladder wall hypertrophy and reflux.  The unenhanced appearance of the liver, spleen, gallbladder, pancreas, adrenal glands, abdominal aorta, inferior vena cava, and retroperitoneal lymph nodes is unremarkable. Stomach, small bowel, and colon are not abnormally distended. Stool fills the colon. No free air or free fluid in the abdomen. Pelvis: Appendix is not identified. No free or loculated pelvic fluid collections. No pelvic lymphadenopathy. Degenerative changes in the spine. No destructive bone lesions. IMPRESSION: Bilateral hydronephrosis and hydroureter. Bladder wall thickening. Prostate gland enlarged. No stones identified. Changes could be due to cystitis and pyelonephritis or bladder  outlet obstruction with bladder wall hypertrophy and reflux. Electronically Signed   By: Lucienne Capers M.D.   On: 03/10/2016 02:50   Dg Abd Acute W/chest  03/06/2016  CLINICAL DATA:  Nausea and vomiting for 6 days. History of hypertension, diabetes and renal failure. EXAM: DG ABDOMEN ACUTE W/ 1V CHEST COMPARISON:  Radiographs 03/02/2016.  CT 09/13/2015. FINDINGS: The heart size and mediastinal contours are normal. The lungs are clear. There is no pleural effusion or pneumothorax. No acute osseous findings are identified. The bowel gas pattern is nonobstructive. There is moderate stool throughout the colon. No evidence of bowel wall thickening, pneumatosis or free air. There are no suspicious abdominal calcifications. No acute osseous findings are seen. IMPRESSION: No evidence of acute cardiopulmonary or abdominal process. Moderate colonic stool burden suggesting constipation. Electronically Signed   By: Richardean Sale M.D.   On: 03/06/2016 12:09   Dg Abd Acute W/chest  03/02/2016  CLINICAL DATA:  Vomiting all week. EXAM: DG ABDOMEN ACUTE W/ 1V CHEST COMPARISON:  None. FINDINGS: The heart, hila, and mediastinum are normal. No pulmonary nodules, masses, or infiltrates. No free air, portal venous gas, or pneumatosis. There is air in the descending colon. There is also an oval air-filled structure in the left mid abdomen measuring 5.6 cm. Whether this is colon or small bowel is unclear on this study. There is a paucity of gas elsewhere throughout the abdomen. No other acute abnormalities. IMPRESSION: No convincing evidence of obstruction. A mildly prominent collection of air in the left mid abdomen could be in colon or small bowel. The remainder of the bowel gas pattern is unremarkable. Recommend follow-up as clinically warranted. Electronically Signed   By: Dorise Bullion III M.D   On: 03/02/2016 09:12    Lab Results: Basic Metabolic Panel:  Recent Labs  03/16/16 0619 03/17/16 0545  NA 141 139  K  4.1 4.4  CL 119* 119*  CO2 18* 18*  GLUCOSE 109* 78  BUN 32* 27*  CREATININE 2.69* 2.56*  CALCIUM 8.5* 8.6*  PHOS 1.7* 1.8*   Liver Function Tests:  Recent Labs  03/16/16 0619 03/17/16 0545  ALBUMIN 2.8* 2.7*     CBC:  Recent Labs  03/16/16 0619 03/17/16 0545  WBC 5.1 4.4  HGB 7.6* 7.2*  HCT 22.9* 21.5*  MCV 100.4* 99.5  PLT 47* 50*    Recent Results (from the past 240 hour(s))  Urine culture     Status: Abnormal   Collection Time: 03/10/16  4:05 AM  Result Value Ref Range Status   Specimen Description URINE, CLEAN CATCH  Final   Special Requests NONE  Final   Culture (A)  Final    <10,000 COLONIES/mL INSIGNIFICANT GROWTH Performed at [facility]    Report Status 03/11/2016 FINAL  Final  MRSA PCR Screening     Status: None   Collection Time: 03/13/16  2:44 AM  Result Value Ref Range Status   MRSA by PCR  NEGATIVE NEGATIVE Final    Comment:        The GeneXpert MRSA Assay (FDA approved for NASAL specimens only), is one component of a comprehensive MRSA colonization surveillance program. It is not intended to diagnose MRSA infection nor to guide or monitor treatment for MRSA infections.      Hospital Course:   This is a 70 years old male with history of multiple medical illnesses was admitted due to vausea, vomiting,and generalized weakness. Patient was found to have worsening of renal failure and bilateral hydronephrosis secondary to obstructive uropathy. Nephrology and and urology consult was was don patient was hydrated with IV fluid and Foley catheter was inserted. His renal function improved and his symptoms resolved on discharge his  BUN is 27 and Cr. 2.56.  Discharge Exam: Blood pressure 132/78, pulse 92, temperature 98.4 F (36.9 C), temperature source Oral, resp. rate 20, height 5\' 7"  (1.702 m), weight 78.52 kg (173 lb 1.7 oz), SpO2 97 %.    Disposition:  Assisted living        Follow-up Information    Follow up with [facility].   Contact information:   [address] [place] [address] [phone]       Follow up with [facility], MD In 1 week.   Specialty:  Internal Medicine   Contact information:   [place] [address] [phone]       Signed: [name]   03/17/2016, 8:17 AM

## 2016-03-17 NOTE — Progress Notes (Signed)
Removed pt IV, tolerated well.  Foley emptied and pt dressed for discharge.  Report called to [name] at [facility].  Awaiting transport back to [facility].

## 2016-03-17 NOTE — Care Management Note (Signed)
Case Management Note  Patient Details  Name: Zachary [name] MRN: [id number] Date of Birth: [birth year]   Expected Discharge Date:     03/17/2016             Expected Discharge Plan:  Assisted Living / Rest Home  In-House Referral:  NA  Discharge planning Services  CM Consult  Post Acute Care Choice:  Home Health Choice offered to:  NA  DME Arranged:    DME Agency:     HH Arranged:  RN [place] Agency:  [facility]  Status of Service:  Completed, signed off  Medicare Important Message Given:  Yes Date Medicare IM Given:    Medicare IM give by:    Date Additional Medicare IM Given:    Additional Medicare Important Message give by:     If discussed at [place] of Stay Meetings, dates discussed:    Additional Comments: Pt returning to [facility] today. Romualdo Bolk, of [facility], made aware of DC today.   Sherald Barge, RN 03/17/2016, 10:42 AM

## 2016-03-17 NOTE — NC FL2 (Signed)
[place] LEVEL OF CARE SCREENING TOOL     IDENTIFICATION  Patient Name: Zachary [name] Birthdate: [birth year] Sex: male Admission Date (Current Location): 03/10/2016  [facility] and [place] Number:  [employer] and Address:  [facility] [address], [place]      Provider Number: [id number]  Attending Physician Name and Address:  [name], MD  Relative Name and Phone Number:       Current Level of Care: Hospital Recommended Level of Care: [place] Prior Approval Number:    Date Approved/Denied:   PASRR Number: [id number]  Discharge Plan: Other (Comment) ([facility])    Current Diagnoses: Patient Active Problem List   Diagnosis Date Noted  . Uremia 03/10/2016  . Hydronephrosis, bilateral 03/10/2016  . Acute on chronic renal failure ([place]) 03/10/2016  . End stage renal disease ([place]) 10/08/2015  . 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 Left arm pain 09/27/2015  . Cold sensation of skin-Left Hand 09/27/2015  . Left arm swelling 09/27/2015  . Pyelonephritis 09/13/2015  . UTI (lower urinary tract infection) 07/20/2015  . Heme positive stool   . Absolute anemia   . Abnormal CT scan, esophagus   . Protein-calorie malnutrition, severe ([employer]) 07/10/2015  . Acute renal failure superimposed on stage 4 chronic kidney disease ([place]) 07/08/2015  . Nausea and vomiting 07/08/2015  . Essential hypertension 07/08/2015  . Hypernatremia 07/08/2015  . Hyperlipidemia 07/08/2015  . Nausea & vomiting 07/08/2015  . Hyperkalemia 05/22/2015  . Obstructive uropathy 04/17/2015  . ARF (acute renal failure) ([place]) 04/17/2015  . Bladder neck contracture   . Urinary retention   . Anemia, chronic disease 04/16/2015  . BPH (benign prostatic hypertrophy) with urinary retention 03/03/2015  . BPH (benign prostatic hypertrophy) with urinary obstruction 01/04/2014  . BPH (benign prostatic hypertrophy) 12/29/2013   . Acute renal failure ([place]) 12/25/2013  . Renal insufficiency 12/25/2013  . Dehydration 12/25/2013  . Mental retardation 10/10/2011  . Acute pyelonephritis 10/07/2011  . 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 Bladder wall thickening 10/06/2011  . DM (diabetes mellitus), type 2, uncontrolled ([place]) 10/02/2011  . Iron deficiency anemia 10/02/2011  . Bacteremia 10/02/2011  . FX CLOSED FIBULA NOS 02/10/2008    Orientation RESPIRATION BLADDER Height & Weight     Self, Situation (MR)  Normal Indwelling catheter Weight: 173 lb 1.7 oz (78.52 kg) Height:  5\' 7"  (170.2 cm)  BEHAVIORAL SYMPTOMS/MOOD NEUROLOGICAL BOWEL NUTRITION STATUS   (n/a)  (n/a) Continent Diet (Renal/carb modified with fluid restriction)  AMBULATORY STATUS COMMUNICATION OF NEEDS Skin   Supervision Verbally Other (Comment) (Fistula. Scrotum and bilateral legs excoriated. )                       Personal Care Assistance Level of Assistance  Bathing, Feeding, Dressing Bathing Assistance: Maximum assistance Feeding assistance: Limited assistance Dressing Assistance: Limited assistance     Functional Limitations Info  Sight, Hearing, Speech Sight Info: Adequate Hearing Info: Adequate Speech Info: Impaired    SPECIAL CARE FACTORS FREQUENCY                       Contractures      Additional Factors Info  Code Status Code Status Info: Full code             Current Medications (03/17/2016):  This is the current hospital active medication list Current Facility-Administered Medications  Medication Dose Route Frequency Provider  Last Rate Last Dose  . 0.9 %  sodium chloride infusion  250 mL Intravenous PRN Theressa Millard, MD      . acetaminophen (TYLENOL) tablet 650 mg  650 mg Oral Q6H PRN Theressa Millard, MD       Or  . acetaminophen (TYLENOL) suppository 650 mg  650 mg Rectal Q6H PRN Theressa Millard, MD      . carvedilol (COREG) tablet 6.25 mg  6.25 mg Oral BID WC Theressa Millard, MD    6.25 mg at 03/17/16 [id number]  . ferrous sulfate tablet 325 mg  325 mg Oral Q breakfast Theressa Millard, MD   325 mg at 03/17/16 [id number]  . HYDROmorphone (DILAUDID) injection 0.5-1 mg  0.5-1 mg Intravenous Q3H PRN Theressa Millard, MD   1 mg at 03/10/16 0650  . insulin aspart (novoLOG) injection 0-9 Units  0-9 Units Subcutaneous Q4H Theressa Millard, MD   1 Units at 03/15/16 1808  . isosorbide-hydrALAZINE (BIDIL) 20-37.5 MG per tablet 1 tablet  1 tablet Oral TID Theressa Millard, MD   1 tablet at 03/16/16 2154  . levothyroxine (SYNTHROID, LEVOTHROID) tablet 75 mcg  75 mcg Oral QAC breakfast Theressa Millard, MD   75 mcg at 03/17/16 [phone]  . ondansetron (ZOFRAN) tablet 4 mg  4 mg Oral Q6H PRN Theressa Millard, MD   4 mg at 03/12/16 2330   Or  . ondansetron (ZOFRAN) injection 4 mg  4 mg Intravenous Q6H PRN Theressa Millard, MD   4 mg at 03/11/16 0431  . oxyCODONE (Oxy IR/ROXICODONE) immediate release tablet 5 mg  5 mg Oral Q4H PRN Theressa Millard, MD   5 mg at 03/14/16 2241  . pantoprazole (PROTONIX) EC tablet 40 mg  40 mg Oral Daily Theressa Millard, MD   40 mg at 03/16/16 1000  . simvastatin (ZOCOR) tablet 20 mg  20 mg Oral QHS Theressa Millard, MD   20 mg at 03/16/16 2154  . sodium chloride 0.45 % 1,000 mL with potassium chloride 10 mEq infusion   Intravenous Continuous Fran Lowes, MD 125 mL/hr at 03/16/16 2154    . sodium chloride flush (NS) 0.9 % injection 3 mL  3 mL Intravenous Q12H Theressa Millard, MD   3 mL at 03/16/16 2200  . sodium chloride flush (NS) 0.9 % injection 3 mL  3 mL Intravenous PRN Theressa Millard, MD      . tamsulosin (FLOMAX) capsule 0.4 mg  0.4 mg Oral Daily Theressa Millard, MD   0.4 mg at 03/16/16 1000     Discharge Medications:   Medication List    STOP taking these medications       torsemide 20 MG tablet  Commonly known as: DEMADEX      TAKE these medications       carvedilol 6.25 MG tablet  Commonly known as:  COREG  Take 1 tablet (6.25 mg total) by mouth 2 (two) times daily with a meal.     ferrous sulfate 325 (65 FE) MG tablet  Take 325 mg by mouth daily with breakfast.     isosorbide-hydrALAZINE 20-37.5 MG tablet  Commonly known as: BIDIL  Take 1 tablet by mouth 3 (three) times daily.     levothyroxine 75 MCG tablet  Commonly known as: SYNTHROID, LEVOTHROID  Take 75 mcg by mouth daily before breakfast.     linagliptin 5 MG Tabs tablet  Commonly known as: TRADJENTA  Take 5  mg by mouth daily.     niacin 1000 MG CR tablet  Commonly known as: NIASPAN  Take 1,000 mg by mouth at bedtime.     omeprazole 20 MG capsule  Commonly known as: PRILOSEC  Take 40 mg by mouth daily.     ondansetron 4 mg Tabs tablet  Commonly known as: ZOFRAN  Take 1 tablet by mouth every 8 hours when necessary nausea.     oxyCODONE-acetaminophen 5-325 MG tablet  Commonly known as: ROXICET  Take 1 tablet by mouth every 6 (six) hours as needed.     simvastatin 20 MG tablet  Commonly known as: ZOCOR  Take 20 mg by mouth at bedtime.     sodium polystyrene 15 GM/60ML suspension  Commonly known as: KAYEXALATE  Take 30 g by mouth every Monday, Wednesday, and Friday. Drinks entire content of 1 bottle (30 gm) once daily on Monday/Wednesday/Friday     tamsulosin 0.4 MG Caps capsule  Commonly known as: FLOMAX  Take 0.4 mg by mouth daily.     Vitamin D (Ergocalciferol) 50000 units Caps capsule  Commonly known as: DRISDOL  Take 1 capsule (50,000 Units total) by mouth every 7 (seven) days.            Relevant Imaging Results:  Relevant Lab Results:   Additional Information SSN# [id number]. Home health RN.   Benay Pike [place], [place]

## 2016-03-17 NOTE — Clinical Social Work Note (Signed)
Pt d/c today back to [facility]. Facility aware and agreeable and will pick up pt later today. DSS guardian, [name] notified by voicemail.  Benay Pike, [place]

## 2016-03-18 ENCOUNTER — Ambulatory Visit (INDEPENDENT_AMBULATORY_CARE_PROVIDER_SITE_OTHER)
Admission: RE | Admit: 2016-03-18 | Discharge: 2016-03-18 | Disposition: A | Discharge status: Home / Self Care | Payer: Medicare Other | Source: Ambulatory Visit | Attending: Vascular Surgery | Admitting: Vascular Surgery

## 2016-03-18 DIAGNOSIS — I129 Hypertensive chronic kidney disease with stage 1 through stage 4 chronic kidney disease, or unspecified chronic kidney disease: Secondary | ICD-10-CM | POA: Diagnosis not present

## 2016-03-18 DIAGNOSIS — Z466 Encounter for fitting and adjustment of urinary device: Secondary | ICD-10-CM | POA: Diagnosis not present

## 2016-03-18 DIAGNOSIS — N186 End stage renal disease: Secondary | ICD-10-CM | POA: Diagnosis not present

## 2016-03-18 DIAGNOSIS — N184 Chronic kidney disease, stage 4 (severe): Secondary | ICD-10-CM | POA: Diagnosis not present

## 2016-03-18 DIAGNOSIS — Z4931 Encounter for adequacy testing for hemodialysis: Secondary | ICD-10-CM | POA: Diagnosis not present

## 2016-03-18 DIAGNOSIS — N400 Enlarged prostate without lower urinary tract symptoms: Secondary | ICD-10-CM | POA: Diagnosis not present

## 2016-03-18 DIAGNOSIS — E1165 Type 2 diabetes mellitus with hyperglycemia: Secondary | ICD-10-CM | POA: Diagnosis not present

## 2016-03-18 DIAGNOSIS — F79 Unspecified intellectual disabilities: Secondary | ICD-10-CM | POA: Diagnosis not present

## 2016-03-18 DIAGNOSIS — R339 Retention of urine, unspecified: Secondary | ICD-10-CM | POA: Diagnosis not present

## 2016-03-19 DIAGNOSIS — N400 Enlarged prostate without lower urinary tract symptoms: Secondary | ICD-10-CM | POA: Diagnosis not present

## 2016-03-19 DIAGNOSIS — E1165 Type 2 diabetes mellitus with hyperglycemia: Secondary | ICD-10-CM | POA: Diagnosis not present

## 2016-03-19 DIAGNOSIS — I129 Hypertensive chronic kidney disease with stage 1 through stage 4 chronic kidney disease, or unspecified chronic kidney disease: Secondary | ICD-10-CM | POA: Diagnosis not present

## 2016-03-19 DIAGNOSIS — N184 Chronic kidney disease, stage 4 (severe): Secondary | ICD-10-CM | POA: Diagnosis not present

## 2016-03-19 DIAGNOSIS — Z466 Encounter for fitting and adjustment of urinary device: Secondary | ICD-10-CM | POA: Diagnosis not present

## 2016-03-19 DIAGNOSIS — R339 Retention of urine, unspecified: Secondary | ICD-10-CM | POA: Diagnosis not present

## 2016-03-20 DIAGNOSIS — Z466 Encounter for fitting and adjustment of urinary device: Secondary | ICD-10-CM | POA: Diagnosis not present

## 2016-03-20 DIAGNOSIS — N184 Chronic kidney disease, stage 4 (severe): Secondary | ICD-10-CM | POA: Diagnosis not present

## 2016-03-20 DIAGNOSIS — N400 Enlarged prostate without lower urinary tract symptoms: Secondary | ICD-10-CM | POA: Diagnosis not present

## 2016-03-20 DIAGNOSIS — I129 Hypertensive chronic kidney disease with stage 1 through stage 4 chronic kidney disease, or unspecified chronic kidney disease: Secondary | ICD-10-CM | POA: Diagnosis not present

## 2016-03-20 DIAGNOSIS — E1165 Type 2 diabetes mellitus with hyperglycemia: Secondary | ICD-10-CM | POA: Diagnosis not present

## 2016-03-20 DIAGNOSIS — R339 Retention of urine, unspecified: Secondary | ICD-10-CM | POA: Diagnosis not present

## 2016-03-24 DIAGNOSIS — Z466 Encounter for fitting and adjustment of urinary device: Secondary | ICD-10-CM | POA: Diagnosis not present

## 2016-03-24 DIAGNOSIS — I129 Hypertensive chronic kidney disease with stage 1 through stage 4 chronic kidney disease, or unspecified chronic kidney disease: Secondary | ICD-10-CM | POA: Diagnosis not present

## 2016-03-24 DIAGNOSIS — N184 Chronic kidney disease, stage 4 (severe): Secondary | ICD-10-CM | POA: Diagnosis not present

## 2016-03-24 DIAGNOSIS — E1165 Type 2 diabetes mellitus with hyperglycemia: Secondary | ICD-10-CM | POA: Diagnosis not present

## 2016-03-24 DIAGNOSIS — N400 Enlarged prostate without lower urinary tract symptoms: Secondary | ICD-10-CM | POA: Diagnosis not present

## 2016-03-24 DIAGNOSIS — R339 Retention of urine, unspecified: Secondary | ICD-10-CM | POA: Diagnosis not present

## 2016-03-26 DIAGNOSIS — N184 Chronic kidney disease, stage 4 (severe): Secondary | ICD-10-CM | POA: Diagnosis not present

## 2016-03-26 DIAGNOSIS — N139 Obstructive and reflux uropathy, unspecified: Secondary | ICD-10-CM | POA: Diagnosis not present

## 2016-03-26 DIAGNOSIS — E1165 Type 2 diabetes mellitus with hyperglycemia: Secondary | ICD-10-CM | POA: Diagnosis not present

## 2016-03-26 DIAGNOSIS — F79 Unspecified intellectual disabilities: Secondary | ICD-10-CM | POA: Diagnosis not present

## 2016-03-27 DIAGNOSIS — E1165 Type 2 diabetes mellitus with hyperglycemia: Secondary | ICD-10-CM | POA: Diagnosis not present

## 2016-03-27 DIAGNOSIS — R339 Retention of urine, unspecified: Secondary | ICD-10-CM | POA: Diagnosis not present

## 2016-03-27 DIAGNOSIS — Z466 Encounter for fitting and adjustment of urinary device: Secondary | ICD-10-CM | POA: Diagnosis not present

## 2016-03-27 DIAGNOSIS — I129 Hypertensive chronic kidney disease with stage 1 through stage 4 chronic kidney disease, or unspecified chronic kidney disease: Secondary | ICD-10-CM | POA: Diagnosis not present

## 2016-03-27 DIAGNOSIS — N184 Chronic kidney disease, stage 4 (severe): Secondary | ICD-10-CM | POA: Diagnosis not present

## 2016-03-27 DIAGNOSIS — N400 Enlarged prostate without lower urinary tract symptoms: Secondary | ICD-10-CM | POA: Diagnosis not present

## 2016-03-28 DIAGNOSIS — N184 Chronic kidney disease, stage 4 (severe): Secondary | ICD-10-CM | POA: Diagnosis not present

## 2016-03-28 DIAGNOSIS — N400 Enlarged prostate without lower urinary tract symptoms: Secondary | ICD-10-CM | POA: Diagnosis not present

## 2016-03-28 DIAGNOSIS — R339 Retention of urine, unspecified: Secondary | ICD-10-CM | POA: Diagnosis not present

## 2016-03-28 DIAGNOSIS — Z466 Encounter for fitting and adjustment of urinary device: Secondary | ICD-10-CM | POA: Diagnosis not present

## 2016-03-28 DIAGNOSIS — I129 Hypertensive chronic kidney disease with stage 1 through stage 4 chronic kidney disease, or unspecified chronic kidney disease: Secondary | ICD-10-CM | POA: Diagnosis not present

## 2016-03-28 DIAGNOSIS — E1165 Type 2 diabetes mellitus with hyperglycemia: Secondary | ICD-10-CM | POA: Diagnosis not present

## 2016-03-31 DIAGNOSIS — R339 Retention of urine, unspecified: Secondary | ICD-10-CM | POA: Diagnosis not present

## 2016-03-31 DIAGNOSIS — I129 Hypertensive chronic kidney disease with stage 1 through stage 4 chronic kidney disease, or unspecified chronic kidney disease: Secondary | ICD-10-CM | POA: Diagnosis not present

## 2016-03-31 DIAGNOSIS — N400 Enlarged prostate without lower urinary tract symptoms: Secondary | ICD-10-CM | POA: Diagnosis not present

## 2016-03-31 DIAGNOSIS — Z466 Encounter for fitting and adjustment of urinary device: Secondary | ICD-10-CM | POA: Diagnosis not present

## 2016-03-31 DIAGNOSIS — N184 Chronic kidney disease, stage 4 (severe): Secondary | ICD-10-CM | POA: Diagnosis not present

## 2016-03-31 DIAGNOSIS — E1165 Type 2 diabetes mellitus with hyperglycemia: Secondary | ICD-10-CM | POA: Diagnosis not present

## 2016-04-01 DIAGNOSIS — Z466 Encounter for fitting and adjustment of urinary device: Secondary | ICD-10-CM | POA: Diagnosis not present

## 2016-04-01 DIAGNOSIS — N139 Obstructive and reflux uropathy, unspecified: Secondary | ICD-10-CM | POA: Diagnosis not present

## 2016-04-01 DIAGNOSIS — N184 Chronic kidney disease, stage 4 (severe): Secondary | ICD-10-CM | POA: Diagnosis not present

## 2016-04-01 DIAGNOSIS — I129 Hypertensive chronic kidney disease with stage 1 through stage 4 chronic kidney disease, or unspecified chronic kidney disease: Secondary | ICD-10-CM | POA: Diagnosis not present

## 2016-04-01 DIAGNOSIS — R339 Retention of urine, unspecified: Secondary | ICD-10-CM | POA: Diagnosis not present

## 2016-04-01 DIAGNOSIS — E1165 Type 2 diabetes mellitus with hyperglycemia: Secondary | ICD-10-CM | POA: Diagnosis not present

## 2016-04-01 DIAGNOSIS — N400 Enlarged prostate without lower urinary tract symptoms: Secondary | ICD-10-CM | POA: Diagnosis not present

## 2016-04-01 DIAGNOSIS — F79 Unspecified intellectual disabilities: Secondary | ICD-10-CM | POA: Diagnosis not present

## 2016-04-02 DIAGNOSIS — E1165 Type 2 diabetes mellitus with hyperglycemia: Secondary | ICD-10-CM | POA: Diagnosis not present

## 2016-04-02 DIAGNOSIS — N184 Chronic kidney disease, stage 4 (severe): Secondary | ICD-10-CM | POA: Diagnosis not present

## 2016-04-02 DIAGNOSIS — N400 Enlarged prostate without lower urinary tract symptoms: Secondary | ICD-10-CM | POA: Diagnosis not present

## 2016-04-02 DIAGNOSIS — R339 Retention of urine, unspecified: Secondary | ICD-10-CM | POA: Diagnosis not present

## 2016-04-02 DIAGNOSIS — Z466 Encounter for fitting and adjustment of urinary device: Secondary | ICD-10-CM | POA: Diagnosis not present

## 2016-04-02 DIAGNOSIS — I129 Hypertensive chronic kidney disease with stage 1 through stage 4 chronic kidney disease, or unspecified chronic kidney disease: Secondary | ICD-10-CM | POA: Diagnosis not present

## 2016-04-03 ENCOUNTER — Encounter (HOSPITAL_COMMUNITY): Payer: Self-pay

## 2016-04-03 ENCOUNTER — Encounter: Payer: Self-pay | Admitting: Vascular Surgery

## 2016-04-03 ENCOUNTER — Inpatient Hospital Stay (HOSPITAL_COMMUNITY)
Admission: EM | Admit: 2016-04-03 | Discharge: 2016-04-07 | DRG: 683 | Disposition: A | Discharge status: Home Health | Payer: Medicare Other | Attending: Internal Medicine | Admitting: Internal Medicine

## 2016-04-03 ENCOUNTER — Emergency Department (HOSPITAL_COMMUNITY): Payer: Medicare Other

## 2016-04-03 DIAGNOSIS — E1165 Type 2 diabetes mellitus with hyperglycemia: Secondary | ICD-10-CM | POA: Diagnosis present

## 2016-04-03 DIAGNOSIS — N289 Disorder of kidney and ureter, unspecified: Secondary | ICD-10-CM | POA: Diagnosis not present

## 2016-04-03 DIAGNOSIS — E871 Hypo-osmolality and hyponatremia: Secondary | ICD-10-CM | POA: Diagnosis present

## 2016-04-03 DIAGNOSIS — I120 Hypertensive chronic kidney disease with stage 5 chronic kidney disease or end stage renal disease: Secondary | ICD-10-CM | POA: Diagnosis not present

## 2016-04-03 DIAGNOSIS — I129 Hypertensive chronic kidney disease with stage 1 through stage 4 chronic kidney disease, or unspecified chronic kidney disease: Secondary | ICD-10-CM | POA: Diagnosis present

## 2016-04-03 DIAGNOSIS — N184 Chronic kidney disease, stage 4 (severe): Secondary | ICD-10-CM | POA: Diagnosis present

## 2016-04-03 DIAGNOSIS — I10 Essential (primary) hypertension: Secondary | ICD-10-CM | POA: Diagnosis not present

## 2016-04-03 DIAGNOSIS — E039 Hypothyroidism, unspecified: Secondary | ICD-10-CM | POA: Diagnosis present

## 2016-04-03 DIAGNOSIS — E1122 Type 2 diabetes mellitus with diabetic chronic kidney disease: Secondary | ICD-10-CM | POA: Diagnosis not present

## 2016-04-03 DIAGNOSIS — N10 Acute tubulo-interstitial nephritis: Secondary | ICD-10-CM | POA: Diagnosis not present

## 2016-04-03 DIAGNOSIS — N390 Urinary tract infection, site not specified: Secondary | ICD-10-CM | POA: Diagnosis present

## 2016-04-03 DIAGNOSIS — F79 Unspecified intellectual disabilities: Secondary | ICD-10-CM | POA: Diagnosis present

## 2016-04-03 DIAGNOSIS — N1330 Unspecified hydronephrosis: Secondary | ICD-10-CM | POA: Diagnosis not present

## 2016-04-03 DIAGNOSIS — N179 Acute kidney failure, unspecified: Secondary | ICD-10-CM | POA: Diagnosis not present

## 2016-04-03 DIAGNOSIS — N186 End stage renal disease: Secondary | ICD-10-CM | POA: Diagnosis not present

## 2016-04-03 DIAGNOSIS — D638 Anemia in other chronic diseases classified elsewhere: Secondary | ICD-10-CM | POA: Diagnosis present

## 2016-04-03 DIAGNOSIS — N400 Enlarged prostate without lower urinary tract symptoms: Secondary | ICD-10-CM | POA: Diagnosis present

## 2016-04-03 DIAGNOSIS — M79604 Pain in right leg: Secondary | ICD-10-CM

## 2016-04-03 DIAGNOSIS — M79661 Pain in right lower leg: Secondary | ICD-10-CM | POA: Diagnosis not present

## 2016-04-03 DIAGNOSIS — E785 Hyperlipidemia, unspecified: Secondary | ICD-10-CM | POA: Diagnosis present

## 2016-04-03 DIAGNOSIS — Z87440 Personal history of urinary (tract) infections: Secondary | ICD-10-CM

## 2016-04-03 DIAGNOSIS — R600 Localized edema: Secondary | ICD-10-CM | POA: Diagnosis not present

## 2016-04-03 DIAGNOSIS — Z809 Family history of malignant neoplasm, unspecified: Secondary | ICD-10-CM

## 2016-04-03 DIAGNOSIS — D509 Iron deficiency anemia, unspecified: Secondary | ICD-10-CM | POA: Diagnosis present

## 2016-04-03 HISTORY — DX: Unspecified protein-calorie malnutrition: E46

## 2016-04-03 HISTORY — DX: Tubulo-interstitial nephritis, not specified as acute or chronic: N12

## 2016-04-03 HISTORY — DX: Unspecified hydronephrosis: N13.30

## 2016-04-03 HISTORY — DX: Hyperosmolality and hypernatremia: E87.0

## 2016-04-03 HISTORY — DX: Unspecified kidney failure: N19

## 2016-04-03 HISTORY — DX: Retention of urine, unspecified: R33.9

## 2016-04-03 HISTORY — DX: Infection and inflammatory reaction due to other cardiac and vascular devices, implants and grafts, initial encounter: T82.7XXA

## 2016-04-03 HISTORY — DX: Obstructive and reflux uropathy, unspecified: N13.9

## 2016-04-03 HISTORY — DX: Other fecal abnormalities: R19.5

## 2016-04-03 LAB — COMPREHENSIVE METABOLIC PANEL
ALT: 12 U/L — ABNORMAL LOW (ref 17–63)
AST: 13 U/L — ABNORMAL LOW (ref 15–41)
Albumin: 3 g/dL — ABNORMAL LOW (ref 3.5–5.0)
Alkaline Phosphatase: 66 U/L (ref 38–126)
Anion gap: 3 — ABNORMAL LOW (ref 5–15)
BUN: 26 mg/dL — ABNORMAL HIGH (ref 6–20)
CO2: 23 mmol/L (ref 22–32)
Calcium: 8.3 mg/dL — ABNORMAL LOW (ref 8.9–10.3)
Chloride: 114 mmol/L — ABNORMAL HIGH (ref 101–111)
Creatinine, Ser: 3.26 mg/dL — ABNORMAL HIGH (ref 0.61–1.24)
GFR calc Af Amer: 21 mL/min — ABNORMAL LOW (ref >60)
GFR calc non Af Amer: 18 mL/min — ABNORMAL LOW (ref >60)
Glucose, Bld: 156 mg/dL — ABNORMAL HIGH (ref 65–99)
Potassium: 4 mmol/L (ref 3.5–5.1)
Sodium: 140 mmol/L (ref 135–145)
Total Bilirubin: 0.4 mg/dL (ref 0.3–1.2)
Total Protein: 6 g/dL — ABNORMAL LOW (ref 6.5–8.1)

## 2016-04-03 LAB — CBC WITH DIFFERENTIAL/PLATELET
Basophils Absolute: 0 10*3/uL (ref 0.0–0.1)
Basophils Relative: 0 %
Eosinophils Absolute: 0.2 10*3/uL (ref 0.0–0.7)
Eosinophils Relative: 3 %
HCT: 23.2 % — ABNORMAL LOW (ref 39.0–52.0)
Hemoglobin: 7.6 g/dL — ABNORMAL LOW (ref 13.0–17.0)
Lymphocytes Relative: 12 %
Lymphs Abs: 0.8 10*3/uL (ref 0.7–4.0)
MCH: 34.1 pg — ABNORMAL HIGH (ref 26.0–34.0)
MCHC: 32.8 g/dL (ref 30.0–36.0)
MCV: 104 fL — ABNORMAL HIGH (ref 78.0–100.0)
Monocytes Absolute: 0.9 10*3/uL (ref 0.1–1.0)
Monocytes Relative: 14 %
Neutro Abs: 4.5 10*3/uL (ref 1.7–7.7)
Neutrophils Relative %: 71 %
Platelets: 172 10*3/uL (ref 150–400)
RBC: 2.23 MIL/uL — ABNORMAL LOW (ref 4.22–5.81)
RDW: 15.3 % (ref 11.5–15.5)
WBC: 6.3 10*3/uL (ref 4.0–10.5)

## 2016-04-03 LAB — URINALYSIS, ROUTINE W REFLEX MICROSCOPIC
Bilirubin Urine: NEGATIVE
Glucose, UA: NEGATIVE mg/dL
Ketones, ur: NEGATIVE mg/dL
Nitrite: NEGATIVE
Protein, ur: >300 mg/dL — AB
Specific Gravity, Urine: 1.02 (ref 1.005–1.030)
pH: 6 (ref 5.0–8.0)

## 2016-04-03 LAB — URINE MICROSCOPIC-ADD ON: Squamous Epithelial / LPF: NONE SEEN

## 2016-04-03 LAB — TSH: TSH: 1.071 u[IU]/mL (ref 0.350–4.500)

## 2016-04-03 MED ORDER — NIACIN ER (ANTIHYPERLIPIDEMIC) 500 MG PO TBCR
1000.0000 mg | EXTENDED_RELEASE_TABLET | Freq: Every day | ORAL | Status: DC
  Administered 2016-04-03 – 2016-04-06 (×4): 1000 mg via ORAL
  Filled 2016-04-03 (×6): qty 2

## 2016-04-03 MED ORDER — DEXTROSE 5 % IV SOLN
1.0000 g | INTRAVENOUS | Status: DC
  Administered 2016-04-04 – 2016-04-06 (×3): 1 g via INTRAVENOUS
  Filled 2016-04-03 (×4): qty 10

## 2016-04-03 MED ORDER — VITAMIN D (ERGOCALCIFEROL) 1.25 MG (50000 UNIT) PO CAPS
50000.0000 [IU] | ORAL_CAPSULE | ORAL | Status: DC
  Administered 2016-04-07: 50000 [IU] via ORAL
  Filled 2016-04-03: qty 1

## 2016-04-03 MED ORDER — ISOSORB DINITRATE-HYDRALAZINE 20-37.5 MG PO TABS
1.0000 | ORAL_TABLET | Freq: Three times a day (TID) | ORAL | Status: DC
  Administered 2016-04-03 – 2016-04-07 (×11): 1 via ORAL
  Filled 2016-04-03 (×18): qty 1

## 2016-04-03 MED ORDER — LEVOTHYROXINE SODIUM 75 MCG PO TABS
75.0000 ug | ORAL_TABLET | Freq: Every day | ORAL | Status: DC
  Administered 2016-04-04 – 2016-04-07 (×4): 75 ug via ORAL
  Filled 2016-04-03 (×4): qty 1

## 2016-04-03 MED ORDER — OXYCODONE-ACETAMINOPHEN 5-325 MG PO TABS: 1.0000 | ORAL_TABLET | Freq: Four times a day (QID) | ORAL | Status: DC | PRN

## 2016-04-03 MED ORDER — SIMVASTATIN 20 MG PO TABS
20.0000 mg | ORAL_TABLET | Freq: Every day | ORAL | Status: DC
  Administered 2016-04-03 – 2016-04-06 (×4): 20 mg via ORAL
  Filled 2016-04-03 (×4): qty 1

## 2016-04-03 MED ORDER — PANTOPRAZOLE SODIUM 40 MG PO TBEC
40.0000 mg | DELAYED_RELEASE_TABLET | Freq: Every day | ORAL | Status: DC
  Administered 2016-04-04 – 2016-04-07 (×5): 40 mg via ORAL
  Filled 2016-04-03 (×5): qty 1

## 2016-04-03 MED ORDER — HYDROGEN PEROXIDE 3 % EX SOLN
CUTANEOUS | Status: AC
  Filled 2016-04-03: qty 473

## 2016-04-03 MED ORDER — CARVEDILOL 3.125 MG PO TABS
6.2500 mg | ORAL_TABLET | Freq: Two times a day (BID) | ORAL | Status: DC
  Administered 2016-04-04 – 2016-04-07 (×7): 6.25 mg via ORAL
  Filled 2016-04-03 (×7): qty 2

## 2016-04-03 MED ORDER — DEXTROSE 5 % IV SOLN
1.0000 g | Freq: Once | INTRAVENOUS | Status: AC
  Administered 2016-04-03: 1 g via INTRAVENOUS
  Filled 2016-04-03: qty 10

## 2016-04-03 MED ORDER — HEPARIN SODIUM (PORCINE) 5000 UNIT/ML IJ SOLN
5000.0000 [IU] | Freq: Three times a day (TID) | INTRAMUSCULAR | Status: DC
  Administered 2016-04-03 – 2016-04-07 (×10): 5000 [IU] via SUBCUTANEOUS
  Filled 2016-04-03 (×10): qty 1

## 2016-04-03 MED ORDER — SODIUM CHLORIDE 0.9 % IV BOLUS (SEPSIS)
1000.0000 mL | Freq: Once | INTRAVENOUS | Status: AC
  Administered 2016-04-03: 1000 mL via INTRAVENOUS

## 2016-04-03 MED ORDER — TAMSULOSIN HCL 0.4 MG PO CAPS
0.4000 mg | ORAL_CAPSULE | Freq: Every day | ORAL | Status: DC
  Administered 2016-04-04 – 2016-04-07 (×5): 0.4 mg via ORAL
  Filled 2016-04-03 (×5): qty 1

## 2016-04-03 MED ORDER — LINAGLIPTIN 5 MG PO TABS
5.0000 mg | ORAL_TABLET | Freq: Every day | ORAL | Status: DC
  Administered 2016-04-04 – 2016-04-07 (×5): 5 mg via ORAL
  Filled 2016-04-03 (×5): qty 1

## 2016-04-03 NOTE — ED Notes (Signed)
Pt sleeping soundly.

## 2016-04-03 NOTE — ED Notes (Signed)
Pt continues to sleep.

## 2016-04-03 NOTE — ED Notes (Signed)
Unable to obtain IV access on patient.  Attempted by multiple nurses.  Dr. Marin Comment notified and vascular access team notified for midline catheter placement.  Per Dr. Marin Comment, pt may go to the floor with no IV access at this time as pt is stable.

## 2016-04-03 NOTE — ED Provider Notes (Signed)
CSN: [id number]     Arrival date & time 04/03/16  0910 History   First MD Initiated Contact with Patient 04/03/16 [phone]     Chief Complaint  Patient presents with  . Joint Swelling   Level V caveat: Mental retardation.  Zachary [name] is a 70 y.o. male who presents to the ED from [facility] complaining of bilateral lower extremity edema for the past two weeks. He tells me his right leg has been more swollen than his left. He denies pain to his extremities. He denies any other complaints. He denies abdominal pain, nausea, vomiting, chest pain, shortness of breath, trouble breathing or coughing.   The history is provided by the patient. The history is limited by a developmental delay. No language interpreter was used.    Past Medical History  Diagnosis Date  . Hypertension   . Diabetes mellitus   . Smoking   . Abnormal CT scan, kidney 10/06/2011  . UTI (lower urinary tract infection) 10/06/2011  . Bladder wall thickening 10/06/2011  . 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 Poor historian poor historian  . BPH (benign prostatic hypertrophy)   . Anxiety     mental retardation  . Hypothyroidism   . MR (mental retardation)   . Acute pyelonephritis 10/07/2011  . Hyperkalemia   . Renal insufficiency     chronic history  . Renal failure (ARF), acute on chronic (HCC)   . Anemia     normocytic  . Edema      history of lower extremity edema  . Hyperlipidemia   . Impaired speech   . Uremia   . Hydronephrosis   . 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 Pyelonephritis   . UTI (lower urinary tract infection)   . Heme positive stool   . Protein calorie malnutrition ([place])   . Hypernatremia   . Obstructive uropathy   . Urinary retention    Past Surgical History  Procedure Laterality Date  . Cystoscopy with urethral dilatation N/A 12/29/2013    Procedure: CYSTOSCOPY WITH URETHRAL DILATATION;  Surgeon: Marissa Nestle, MD;  Location: [facility];  Service: Urology;  Laterality: N/A;  .  Transurethral resection of prostate N/A 01/04/2014    Procedure: TRANSURETHRAL RESECTION OF THE PROSTATE (TURP) (procedure #2);  Surgeon: Marissa Nestle, MD;  Location: [facility];  Service: Urology;  Laterality: N/A;  . Circumcision N/A 01/04/2014    Procedure: CIRCUMCISION ADULT (procedure #1);  Surgeon: Marissa Nestle, MD;  Location: [facility];  Service: Urology;  Laterality: N/A;  . Cystoscopy w/ retrogrades Bilateral 06/29/2015    Procedure: CYSTOSCOPY, DILATION OF URETHRAL STRICTURE WITH BILATERAL RETROGRADE PYELOGRAM,SUPRAPUBIC TUBE CHANGE;  Surgeon: Festus Aloe, MD;  Location: [facility];  Service: Urology;  Laterality: Bilateral;  . Av fistula placement Left 07/06/2015    Procedure:  INSERTION LEFT ARM ARTERIOVENOUS GORTEX GRAFT;  Surgeon: Angelia Mould, MD;  Location: [facility];  Service: Vascular;  Laterality: Left;  . Peripheral vascular catheterization N/A 10/08/2015    Procedure: A/V Shuntogram;  Surgeon: Angelia Mould, MD;  Location: [facility];  Service: Cardiovascular;  Laterality: N/A;  . Avgg removal Left 10/09/2015    Procedure: REMOVAL OF ARTERIOVENOUS GORETEX GRAFT ([place]) Evacuation of Lymphocele, Vein Patch angioplasty of brachial artery.;  Surgeon: Angelia Mould, MD;  Location: [facility];  Service: Vascular;  Laterality: Left;  . Av fistula placement Right 02/26/2016    Procedure: ARTERIOVENOUS (AV) FISTULA CREATION ;  Surgeon: Angelia Mould, MD;  Location: [facility];  Service: Vascular;  Laterality: Right;  . Bascilic vein transposition Right 02/26/2016    Procedure: Right [place];  Surgeon: Angelia Mould, MD;  [facility];  Service: Vascular;  Laterality: Right;   Family History  Problem Relation Age of Onset  . Cancer Mother    Social History  Substance Use Topics  . Smoking status: Never Smoker   . Smokeless tobacco: Never Used  . Alcohol Use: No     Comment: occ. use     Review of Systems  Unable to perform  ROS: Other (Mental retardation )  Constitutional: Negative for fever.  Respiratory: Negative for shortness of breath.   Cardiovascular: Positive for leg swelling. Negative for chest pain.  Gastrointestinal: Negative for nausea, vomiting and abdominal pain.  Skin: Negative for rash.      Allergies  Review of patient's allergies indicates no known allergies.  Home Medications   Prior to Admission medications   Medication Sig Start Date End Date Taking? Authorizing Provider  carvedilol (COREG) 6.25 MG tablet Take 1 tablet (6.25 mg total) by mouth 2 (two) times daily with a meal. 03/08/15  Yes [name], MD  ferrous sulfate 325 (65 FE) MG tablet Take 325 mg by mouth daily with breakfast.   Yes Historical Provider, MD  isosorbide-hydrALAZINE (BIDIL) 20-37.5 MG per tablet Take 1 tablet by mouth 3 (three) times daily. 03/08/15  Yes [name], MD  levothyroxine (SYNTHROID, LEVOTHROID) 75 MCG tablet Take 75 mcg by mouth daily before breakfast.    Yes Historical Provider, MD  linagliptin (TRADJENTA) 5 MG TABS tablet Take 5 mg by mouth daily.   Yes Historical Provider, MD  niacin (NIASPAN) 1000 MG CR tablet Take 1,000 mg by mouth at bedtime.   Yes Historical Provider, MD  omeprazole (PRILOSEC) 20 MG capsule Take 40 mg by mouth daily.   Yes Historical Provider, MD  ondansetron (ZOFRAN) 4 mg TABS tablet Take 1 tablet by mouth every 8 hours when necessary nausea. 03/02/16  Yes Leonard Schwartz, MD  oxyCODONE-acetaminophen (ROXICET) 5-325 MG tablet Take 1 tablet by mouth every 6 (six) hours as needed. Patient taking differently: Take 1 tablet by mouth every 6 (six) hours as needed for moderate pain.  02/26/16  Yes Alvia Grove, PA-C  simvastatin (ZOCOR) 20 MG tablet Take 20 mg by mouth at bedtime.  02/28/15  Yes Historical Provider, MD  sodium polystyrene (KAYEXALATE) 15 GM/60ML suspension Take 30 g by mouth every Monday, Wednesday, and Friday. Drinks entire content of 1 bottle (30 gm) once daily on  Monday/Wednesday/Friday   Yes Historical Provider, MD  tamsulosin (FLOMAX) 0.4 MG CAPS capsule Take 0.4 mg by mouth daily.   Yes Historical Provider, MD  Vitamin D, Ergocalciferol, (DRISDOL) 50000 UNITS CAPS capsule Take 1 capsule (50,000 Units total) by mouth every 7 (seven) days. Patient taking differently: Take 50,000 Units by mouth every Monday.  03/08/15  Yes Tesfaye Fanta, MD   BP 180/88 mmHg  Pulse 97  Temp(Src) 98.9 F (37.2 C) (Oral)  Resp 18  Wt 78.472 kg  SpO2 98% Physical Exam  Constitutional: He appears well-developed and well-nourished. No distress.  Nontoxic appearing.  HENT:  Head: Normocephalic and atraumatic.  Mouth/Throat: Oropharynx is clear and moist.  Eyes: Conjunctivae are normal. Pupils are equal, round, and reactive to light. Right eye exhibits no discharge. Left eye exhibits no discharge.  Neck: Neck supple. No JVD present.  Cardiovascular: Normal rate, regular rhythm, normal heart sounds and intact  distal pulses.  Exam reveals no gallop and no friction rub.   No murmur heard. Heart rate is 92. Bilateral dorsalis pedis and radial pulses are intact. Good capillary refill to his distal toes.  Pulmonary/Chest: Effort normal and breath sounds normal. No respiratory distress. He has no wheezes. He has no rales.  Lungs are clear to auscultation bilaterally. No increased work of breathing.  Abdominal: Soft. There is no tenderness. There is no guarding.  Abdomen is soft and nontender to palpation.  Genitourinary:  Foley catheter in place. Draining urine normally. No penile testicular tenderness to palpation.  Musculoskeletal: He exhibits edema.  Bilateral lower extremity edema with right greater than left. No calf tenderness. No overlying skin changes.   Lymphadenopathy:    He has no cervical adenopathy.  Neurological: He is alert. Coordination normal.  Skin: Skin is warm and dry. No rash noted. He is not diaphoretic. No erythema. No pallor.  Psychiatric: He has a  normal mood and affect. His behavior is normal.  Nursing note and vitals reviewed.   ED Course  Procedures (including critical care time) Labs Review Labs Reviewed  URINALYSIS, ROUTINE W REFLEX MICROSCOPIC (NOT AT [facility]) - Abnormal; Notable for the following:    APPearance TURBID (*)    Hgb urine dipstick LARGE (*)    Protein, ur >300 (*)    Leukocytes, UA LARGE (*)    All other components within normal limits  CBC WITH DIFFERENTIAL/PLATELET - Abnormal; Notable for the following:    RBC 2.23 (*)    Hemoglobin 7.6 (*)    HCT 23.2 (*)    MCV 104.0 (*)    MCH 34.1 (*)    All other components within normal limits  COMPREHENSIVE METABOLIC PANEL - Abnormal; Notable for the following:    Chloride 114 (*)    Glucose, Bld 156 (*)    BUN 26 (*)    Creatinine, Ser 3.26 (*)    Calcium 8.3 (*)    Total Protein 6.0 (*)    Albumin 3.0 (*)    AST 13 (*)    ALT 12 (*)    GFR calc non Af Amer 18 (*)    GFR calc Af Amer 21 (*)    Anion gap 3 (*)    All other components within normal limits  URINE MICROSCOPIC-ADD ON - Abnormal; Notable for the following:    Bacteria, UA MANY (*)    All other components within normal limits  URINE CULTURE    Imaging Review US Renal  04/03/2016  CLINICAL DATA:  Pain from catheter.  Renal insufficiency. EXAM: RENAL / URINARY TRACT ULTRASOUND COMPLETE COMPARISON:  CT abdomen dated 03/10/2016. Also abdomen ultrasound dated 05/24/2015. FINDINGS: Right Kidney: Length: 11.4 cm. Increased cortical echogenicity, similar to the previous abdomen ultrasound. Stable mild hydronephrosis. Left Kidney: Length: 10 cm. Increased renal cortex echogenicity. Benign-appearing cyst within the midpole region of the left kidney, measuring approximately 2.8 cm greatest dimension, probably stable given differences in measurement technique. No hydronephrosis. Bladder: Bladder decompressed by Foley catheter. Bladder wall thickness measured at 1.3 cm, probably accentuated to some degree by  the bladder decompression. IMPRESSION: 1. Bladder decompressed by Foley catheter. Bladder wall appears thickened, measured at 1.3 cm thickness, probably accentuated to some degree by the bladder decompression. Recommend correlation with urinalysis to exclude associated cystitis. 2. Stable mild right-sided hydronephrosis. 3. Echogenic renal cortices bilaterally suggesting chronic medical renal disease. These results will be called to the ordering clinician or representative by the Radiologist Assistant,  and communication documented in the PACS or zVision Dashboard. Electronically Signed   By: Franki Cabot M.D.   On: 04/03/2016 13:37   US Venous Img Lower Unilateral Right  04/03/2016  CLINICAL DATA:  Right lower extremity edema a EXAM: RIGHT LOWER EXTREMITY VENOUS DOPPLER ULTRASOUND TECHNIQUE: Gray-scale sonography with compression, as well as color and duplex ultrasound, were performed to evaluate the deep venous system from the level of the common femoral vein through the popliteal and proximal calf veins. COMPARISON:  None FINDINGS: Normal compressibility of the common femoral, superficial femoral, and popliteal veins, as well as the proximal calf veins. No filling defects to suggest DVT on grayscale or color Doppler imaging. Doppler waveforms show normal direction of venous flow, normal respiratory phasicity and response to augmentation. Moderate right calf subcutaneous edema limits evaluation of calf veins. Survey views of the contralateral common femoral vein are unremarkable. IMPRESSION: No evidence of right lower extremity deep vein thrombosis. Electronically Signed   By: Lucrezia Europe M.D.   On: 04/03/2016 10:55   I have personally reviewed and evaluated these images and lab results as part of my medical decision-making.   EKG Interpretation None     Filed Vitals:   04/03/16 1430 04/03/16 1500 04/03/16 1507 04/03/16 1530  BP: 185/97 184/93 184/93 180/88  Pulse: 96 97 96 97  Temp:      TempSrc:       Resp:   18   Weight:      SpO2: 98% 98% 98% 98%    MDM   Meds given in ED:  Medications  hydrogen peroxide 3 % external solution (  Not Given 04/03/16 0945)  cefTRIAXone (ROCEPHIN) 1 g in dextrose 5 % 50 mL IVPB (0 g Intravenous Stopped 04/03/16 1510)  sodium chloride 0.9 % bolus 1,000 mL (0 mLs Intravenous Paused 04/03/16 1534)    Current Discharge Medication List      Final diagnoses:  UTI (lower urinary tract infection)  ESRD (end stage renal disease) (HCC)  Hydronephrosis, unspecified hydronephrosis type  Right leg pain   This is a 70 y.o. male who presents to the ED from [facility] complaining of bilateral lower extremity edema for the past two weeks. He tells me his right leg has been more swollen than his left. He denies pain to his extremities.  On exam the patient is afebrile nontoxic appearing. He has bilateral lower extremity edema with his right leg greater than his left. No tenderness to palpation. No overlying skin changes. Foley catheter is in place and draining urine well. No penile or testicular tenderness to palpation. Urinalysis shows large leukocytes with too numerous to count white blood cells. Urine sent for culture. Concern for urinary tract infection with patient having Foley catheter. Will provide with grandma Rocephin. CBC reveals a hemoglobin of 7.6 which is around this patient's baseline. CMP reveals worsening kidney function with a creatinine at 3.26. This is almost a 1. increased from his most recent blood work. Patient continues to have worsening kidney function. Right lower extreme a DVT study shows no evidence of DVT. Renal ultrasound shows stable right-sided hydronephrosis. Patient has bladder wall thickness. This is consistent with his UTI on urinalysis. Will admit the patient for urinary tract infection and worsening kidney function. Patient is in agreement with admission. The patient was accepted for admission by Dr. Raynelle Chary.   This patient was  discussed with and evaluated by Dr. Wyvonnia Dusky who agrees with assessment and plan.     Waynetta Pean,  PA-C 04/03/16 [place], MD 04/04/16 1031

## 2016-04-03 NOTE — H&P (Signed)
[facility] History and Physical  PRUITT ZARETSKY [id number] DOB: [birth year]    PCP:   [name], MD   Chief Complaint:  Bilateral leg edema, found to have AKI on CKD, and UTI.   HPI: Zachary [name] is an 70 y.o. male with hx of CKD4, with Cr fluctuations in the past month, s/p AVF for impending future dialysis, DM, HTN, BPH, mental retardation, lives at SNF, brought to the ER as he was having bilateral lower extremity edema, but no SOB, or orthopnea.  He has been on  Bidil and Flomax.  Evaluation in the ER showed AKI on CKD with Cr of 3.26 and normal K, with previous Cr of 2.56 about a week ago.  UA was positive for a UTI with TNTC WBCs and RBCs, and leukocytes positive.  His Hb was low at 7.6 g per dL, and he was given IV Rocephin.  Hospitalist was asked to admit him for AKI on CKD, along with UTI.  He was a difficult stick, and several attempts were made.  He also is afraid of needles, and a central line placement would require restraint or sedation.   Rewiew of Systems:  Constitutional: Negative for malaise, fever and chills. No significant weight loss or weight gain Eyes: Negative for eye pain, redness and discharge, diplopia, visual changes, or flashes of light. ENMT: Negative for ear pain, hoarseness, nasal congestion, sinus pressure and sore throat. No headaches; tinnitus, drooling, or problem swallowing. Cardiovascular: Negative for chest pain, palpitations, diaphoresis, dyspnea and peripheral edema. ; No orthopnea, PND Respiratory: Negative for cough, hemoptysis, wheezing and stridor. No pleuritic chestpain. Gastrointestinal: Negative for nausea, vomiting, diarrhea, constipation, abdominal pain, melena, blood in stool, hematemesis, jaundice and rectal bleeding.    Genitourinary: Negative for frequency, dysuria, incontinence,flank pain and hematuria; Musculoskeletal: Negative for back pain and neck pain. Negative for swelling and trauma.;  Skin: . Negative for pruritus,  rash, abrasions, bruising and skin lesion.; ulcerations Neuro: Negative for headache, lightheadedness and neck stiffness. Negative for weakness, altered level of consciousness , altered mental status, extremity weakness, burning feet, involuntary movement, seizure and syncope.  Psych: negative for anxiety, depression, insomnia, tearfulness, panic attacks, hallucinations, paranoia, suicidal or homicidal ideation    Past Medical History  Diagnosis Date  . Hypertension   . Diabetes mellitus   . Smoking   . Abnormal CT scan, kidney 10/06/2011  . UTI (lower urinary tract infection) 10/06/2011  . Bladder wall thickening 10/06/2011  . 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 Poor historian poor historian  . BPH (benign prostatic hypertrophy)   . Anxiety     mental retardation  . Hypothyroidism   . MR (mental retardation)   . Acute pyelonephritis 10/07/2011  . Hyperkalemia   . Renal insufficiency     chronic history  . Renal failure (ARF), acute on chronic (HCC)   . Anemia     normocytic  . Edema      history of lower extremity edema  . Hyperlipidemia   . Impaired speech   . Uremia   . Hydronephrosis   . 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 Pyelonephritis   . UTI (lower urinary tract infection)   . Heme positive stool   . Protein calorie malnutrition ([place])   . Hypernatremia   . Obstructive uropathy   . Urinary retention     Past Surgical History  Procedure Laterality Date  . Cystoscopy with urethral dilatation N/A 12/29/2013    Procedure: CYSTOSCOPY WITH URETHRAL DILATATION;  Surgeon:  Marissa Nestle, MD;  Location: [facility];  Service: Urology;  Laterality: N/A;  . Transurethral resection of prostate N/A 01/04/2014    Procedure: TRANSURETHRAL RESECTION OF THE PROSTATE (TURP) (procedure #2);  Surgeon: Marissa Nestle, MD;  Location: [facility];  Service: Urology;  Laterality: N/A;  . Circumcision N/A 01/04/2014    Procedure: CIRCUMCISION ADULT (procedure #1);  Surgeon: Marissa Nestle, MD;  Location: [facility];  Service: Urology;  Laterality: N/A;  . Cystoscopy w/ retrogrades Bilateral 06/29/2015    Procedure: CYSTOSCOPY, DILATION OF URETHRAL STRICTURE WITH BILATERAL RETROGRADE PYELOGRAM,SUPRAPUBIC TUBE CHANGE;  Surgeon: Festus Aloe, MD;  Location: [facility];  Service: Urology;  Laterality: Bilateral;  . Av fistula placement Left 07/06/2015    Procedure:  INSERTION LEFT ARM ARTERIOVENOUS GORTEX GRAFT;  Surgeon: Angelia Mould, MD;  Location: [place];  Service: Vascular;  Laterality: Left;  . Peripheral vascular catheterization N/A 10/08/2015    Procedure: A/V Shuntogram;  Surgeon: Angelia Mould, MD;  Location: [place] [facility];  Service: Cardiovascular;  Laterality: N/A;  . Avgg removal Left 10/09/2015    Procedure: REMOVAL OF ARTERIOVENOUS GORETEX GRAFT ([place]) Evacuation of Lymphocele, Vein Patch angioplasty of brachial artery.;  Surgeon: Angelia Mould, MD;  Location: [place];  Service: Vascular;  Laterality: Left;  . Av fistula placement Right 02/26/2016    Procedure: ARTERIOVENOUS (AV) FISTULA CREATION ;  Surgeon: Angelia Mould, MD;  Location: [place];  Service: Vascular;  Laterality: Right;  . Bascilic vein transposition Right 02/26/2016    Procedure: Right [place];  Surgeon: Angelia Mould, MD;  Location: [place];  Service: Vascular;  Laterality: Right;    Medications:  HOME MEDS: Prior to Admission medications   Medication Sig Start Date End Date Taking? Authorizing Provider  carvedilol (COREG) 6.25 MG tablet Take 1 tablet (6.25 mg total) by mouth 2 (two) times daily with a meal. 03/08/15  Yes [name], MD  ferrous sulfate 325 (65 FE) MG tablet Take 325 mg by mouth daily with breakfast.   Yes Historical Provider, MD  isosorbide-hydrALAZINE (BIDIL) 20-37.5 MG per tablet Take 1 tablet by mouth 3 (three) times daily. 03/08/15  Yes [name], MD  levothyroxine (SYNTHROID, LEVOTHROID) 75 MCG tablet Take 75 mcg by  mouth daily before breakfast.    Yes Historical Provider, MD  linagliptin (TRADJENTA) 5 MG TABS tablet Take 5 mg by mouth daily.   Yes Historical Provider, MD  niacin (NIASPAN) 1000 MG CR tablet Take 1,000 mg by mouth at bedtime.   Yes Historical Provider, MD  omeprazole (PRILOSEC) 20 MG capsule Take 40 mg by mouth daily.   Yes Historical Provider, MD  ondansetron (ZOFRAN) 4 mg TABS tablet Take 1 tablet by mouth every 8 hours when necessary nausea. 03/02/16  Yes Leonard Schwartz, MD  oxyCODONE-acetaminophen (ROXICET) 5-325 MG tablet Take 1 tablet by mouth every 6 (six) hours as needed. Patient taking differently: Take 1 tablet by mouth every 6 (six) hours as needed for moderate pain.  02/26/16  Yes Alvia Grove, PA-C  simvastatin (ZOCOR) 20 MG tablet Take 20 mg by mouth at bedtime.  02/28/15  Yes Historical Provider, MD  sodium polystyrene (KAYEXALATE) 15 GM/60ML suspension Take 30 g by mouth every Monday, Wednesday, and Friday. Drinks entire content of 1 bottle (30 gm) once daily on Monday/Wednesday/Friday   Yes Historical Provider, MD  tamsulosin (FLOMAX) 0.4 MG CAPS capsule Take 0.4 mg by mouth daily.   Yes Historical Provider, MD  Vitamin D, Ergocalciferol, (DRISDOL) 50000 UNITS CAPS capsule Take 1 capsule (50,000 Units total) by mouth every 7 (seven) days. Patient taking differently: Take 50,000 Units by mouth every Monday.  03/08/15  Yes [name], MD     Allergies:  No Known Allergies  Social History:   reports that he has never smoked. He has never used smokeless tobacco. He reports that he does not drink alcohol or use illicit drugs.  Family History: Family History  Problem Relation Age of Onset  . Cancer Mother      Physical Exam: Filed Vitals:   04/03/16 1140 04/03/16 1200 04/03/16 1316 04/03/16 1330  BP: 156/93 151/92 157/88 156/96  Pulse: 94 96 92 95  Temp:      TempSrc:      Resp: 18  18   Weight:      SpO2: 98% 98% 98% 97%   Blood pressure 156/96, pulse 95,  temperature 98.9 F (37.2 C), temperature source Oral, resp. rate 18, weight 78.472 kg (173 lb), SpO2 97 %.  GEN:  Pleasant  patient lying in the stretcher in no acute distress; cooperative with exam. PSYCH:  alert and oriented x4; does not appear anxious or depressed; affect is appropriate. HEENT: Mucous membranes pink and anicteric; PERRLA; EOM intact; no cervical lymphadenopathy nor thyromegaly or carotid bruit; no JVD; There were no stridor. Neck is very supple. Breasts:: Not examined CHEST WALL: No tenderness CHEST: Normal respiration, clear to auscultation bilaterally.  HEART: Regular rate and rhythm.  There are no murmur, rub, or gallops.   BACK: No kyphosis or scoliosis; no CVA tenderness ABDOMEN: soft and non-tender; no masses, no organomegaly, normal abdominal bowel sounds; no pannus; no intertriginous candida. There is no rebound and no distention. Rectal Exam: Not done EXTREMITIES: No bone or joint deformity; age-appropriate arthropathy of the hands and knees; no edema; no ulcerations.  There is no calf tenderness. Genitalia: not examined PULSES: 2+ and symmetric SKIN: Normal hydration no rash or ulceration CNS: Cranial nerves 2-12 grossly intact no focal lateralizing neurologic deficit.  Speech is fluent; uvula elevated with phonation, facial symmetry and tongue midline. DTR are normal bilaterally, cerebella exam is intact, barbinski is negative and strengths are equaled bilaterally.  No sensory loss.   Labs on Admission:  Basic Metabolic Panel:  Recent Labs Lab 04/03/16 0935  NA 140  K 4.0  CL 114*  CO2 23  GLUCOSE 156*  BUN 26*  CREATININE 3.26*  CALCIUM 8.3*   Liver Function Tests:  Recent Labs Lab 04/03/16 0935  AST 13*  ALT 12*  ALKPHOS 66  BILITOT 0.4  PROT 6.0*  ALBUMIN 3.0*   CBC:  Recent Labs Lab 04/03/16 0935  WBC 6.3  NEUTROABS 4.5  HGB 7.6*  HCT 23.2*  MCV 104.0*  PLT 172    Radiological Exams on Admission: US Renal  04/03/2016   CLINICAL DATA:  Pain from catheter.  Renal insufficiency. EXAM: RENAL / URINARY TRACT ULTRASOUND COMPLETE COMPARISON:  CT abdomen dated 03/10/2016. Also abdomen ultrasound dated 05/24/2015. FINDINGS: Right Kidney: Length: 11.4 cm. Increased cortical echogenicity, similar to the previous abdomen ultrasound. Stable mild hydronephrosis. Left Kidney: Length: 10 cm. Increased renal cortex echogenicity. Benign-appearing cyst within the midpole region of the left kidney, measuring approximately 2.8 cm greatest dimension, probably stable given differences in measurement technique. No hydronephrosis. Bladder: Bladder decompressed by Foley catheter. Bladder wall thickness measured at 1.3 cm, probably accentuated to some degree by the bladder decompression. IMPRESSION: 1. Bladder decompressed by Foley catheter.  Bladder wall appears thickened, measured at 1.3 cm thickness, probably accentuated to some degree by the bladder decompression. Recommend correlation with urinalysis to exclude associated cystitis. 2. Stable mild right-sided hydronephrosis. 3. Echogenic renal cortices bilaterally suggesting chronic medical renal disease. These results will be called to the ordering clinician or representative by the Radiologist Assistant, and communication documented in the PACS or zVision Dashboard. Electronically Signed   By: Franki Cabot M.D.   On: 04/03/2016 13:37   US Venous Img Lower Unilateral Right  04/03/2016  CLINICAL DATA:  Right lower extremity edema a EXAM: RIGHT LOWER EXTREMITY VENOUS DOPPLER ULTRASOUND TECHNIQUE: Gray-scale sonography with compression, as well as color and duplex ultrasound, were performed to evaluate the deep venous system from the level of the common femoral vein through the popliteal and proximal calf veins. COMPARISON:  None FINDINGS: Normal compressibility of the common femoral, superficial femoral, and popliteal veins, as well as the proximal calf veins. No filling defects to suggest DVT on  grayscale or color Doppler imaging. Doppler waveforms show normal direction of venous flow, normal respiratory phasicity and response to augmentation. Moderate right calf subcutaneous edema limits evaluation of calf veins. Survey views of the contralateral common femoral vein are unremarkable. IMPRESSION: No evidence of right lower extremity deep vein thrombosis. Electronically Signed   By: Lucrezia Europe M.D.   On: 04/03/2016 10:55    EKG: Independently reviewed.    Assessment/Plan Present on Admission:  . Acute renal failure superimposed on stage 4 chronic kidney disease ([place]) . ARF (acute renal failure) ([place]) . DM (diabetes mellitus), type 2, uncontrolled ([place]) . Acute pyelonephritis . Essential hypertension . UTI (lower urinary tract infection) . AKI (acute kidney injury) ([place])  PLAN:  UTI:  Will admit him for IV antibiotics.  Urine culture done.  WIll continue with IV Rocephin.  Will need to obtain a midline placement, despite attempting to conserve his veins for future dialysis access.  AKI on CKD:  Will avoid nephrotoxic drugs.  IVF will be given. Leg edema:  I am not convinced he has volume overload or CHF.  [place] of leg was negative for DVT.  It could be ankle edema from hydralazine and Flomax.  HTN:  Will continue with meds.     Other plans as per orders. Code Status: FULL Haskel Khan, MD. FACP [facility] Pager [phone] 7pm to 7am.  04/03/2016, 3:01 PM

## 2016-04-03 NOTE — ED Notes (Signed)
Pt is a resident of [facility] and reports swelling in ankle since last week.  Pt also has indwelling catheter and c/o pain from catheter.

## 2016-04-04 DIAGNOSIS — F79 Unspecified intellectual disabilities: Secondary | ICD-10-CM | POA: Diagnosis present

## 2016-04-04 DIAGNOSIS — N400 Enlarged prostate without lower urinary tract symptoms: Secondary | ICD-10-CM | POA: Diagnosis present

## 2016-04-04 DIAGNOSIS — I129 Hypertensive chronic kidney disease with stage 1 through stage 4 chronic kidney disease, or unspecified chronic kidney disease: Secondary | ICD-10-CM | POA: Diagnosis present

## 2016-04-04 DIAGNOSIS — N184 Chronic kidney disease, stage 4 (severe): Secondary | ICD-10-CM | POA: Diagnosis not present

## 2016-04-04 DIAGNOSIS — N179 Acute kidney failure, unspecified: Secondary | ICD-10-CM | POA: Diagnosis not present

## 2016-04-04 DIAGNOSIS — E039 Hypothyroidism, unspecified: Secondary | ICD-10-CM | POA: Diagnosis present

## 2016-04-04 DIAGNOSIS — E1165 Type 2 diabetes mellitus with hyperglycemia: Secondary | ICD-10-CM | POA: Diagnosis not present

## 2016-04-04 DIAGNOSIS — D509 Iron deficiency anemia, unspecified: Secondary | ICD-10-CM | POA: Diagnosis present

## 2016-04-04 DIAGNOSIS — N390 Urinary tract infection, site not specified: Secondary | ICD-10-CM | POA: Diagnosis not present

## 2016-04-04 DIAGNOSIS — I10 Essential (primary) hypertension: Secondary | ICD-10-CM | POA: Diagnosis not present

## 2016-04-04 DIAGNOSIS — Z87440 Personal history of urinary (tract) infections: Secondary | ICD-10-CM | POA: Diagnosis not present

## 2016-04-04 DIAGNOSIS — N10 Acute tubulo-interstitial nephritis: Secondary | ICD-10-CM | POA: Diagnosis not present

## 2016-04-04 DIAGNOSIS — Z809 Family history of malignant neoplasm, unspecified: Secondary | ICD-10-CM | POA: Diagnosis not present

## 2016-04-04 DIAGNOSIS — E785 Hyperlipidemia, unspecified: Secondary | ICD-10-CM | POA: Diagnosis present

## 2016-04-04 DIAGNOSIS — D638 Anemia in other chronic diseases classified elsewhere: Secondary | ICD-10-CM | POA: Diagnosis not present

## 2016-04-04 DIAGNOSIS — E1122 Type 2 diabetes mellitus with diabetic chronic kidney disease: Secondary | ICD-10-CM | POA: Diagnosis present

## 2016-04-04 DIAGNOSIS — E871 Hypo-osmolality and hyponatremia: Secondary | ICD-10-CM | POA: Diagnosis present

## 2016-04-04 DIAGNOSIS — R600 Localized edema: Secondary | ICD-10-CM | POA: Diagnosis not present

## 2016-04-04 LAB — CBC
HCT: 21.2 % — ABNORMAL LOW (ref 39.0–52.0)
Hemoglobin: 7 g/dL — ABNORMAL LOW (ref 13.0–17.0)
MCH: 34 pg (ref 26.0–34.0)
MCHC: 33 g/dL (ref 30.0–36.0)
MCV: 102.9 fL — ABNORMAL HIGH (ref 78.0–100.0)
Platelets: 137 10*3/uL — ABNORMAL LOW (ref 150–400)
RBC: 2.06 MIL/uL — ABNORMAL LOW (ref 4.22–5.81)
RDW: 15 % (ref 11.5–15.5)
WBC: 4.9 10*3/uL (ref 4.0–10.5)

## 2016-04-04 LAB — OSMOLALITY, URINE: Osmolality, Ur: 424 mOsm/kg (ref 300–900)

## 2016-04-04 LAB — BASIC METABOLIC PANEL
Anion gap: 4 — ABNORMAL LOW (ref 5–15)
BUN: 25 mg/dL — ABNORMAL HIGH (ref 6–20)
CO2: 22 mmol/L (ref 22–32)
Calcium: 8.2 mg/dL — ABNORMAL LOW (ref 8.9–10.3)
Chloride: 116 mmol/L — ABNORMAL HIGH (ref 101–111)
Creatinine, Ser: 2.94 mg/dL — ABNORMAL HIGH (ref 0.61–1.24)
GFR calc Af Amer: 24 mL/min — ABNORMAL LOW (ref >60)
GFR calc non Af Amer: 21 mL/min — ABNORMAL LOW (ref >60)
Glucose, Bld: 107 mg/dL — ABNORMAL HIGH (ref 65–99)
Potassium: 3.7 mmol/L (ref 3.5–5.1)
Sodium: 142 mmol/L (ref 135–145)

## 2016-04-04 LAB — SODIUM, URINE, RANDOM: Sodium, Ur: 116 mmol/L

## 2016-04-04 LAB — PREPARE RBC (CROSSMATCH)

## 2016-04-04 LAB — OSMOLALITY: Osmolality: 308 mOsm/kg — ABNORMAL HIGH (ref 275–295)

## 2016-04-04 MED ORDER — FUROSEMIDE 10 MG/ML IJ SOLN
40.0000 mg | Freq: Two times a day (BID) | INTRAMUSCULAR | Status: DC
  Administered 2016-04-04 – 2016-04-06 (×5): 40 mg via INTRAVENOUS
  Filled 2016-04-04 (×5): qty 4

## 2016-04-04 MED ORDER — SODIUM CHLORIDE 0.9 % IV SOLN
INTRAVENOUS | Status: DC
  Administered 2016-04-04 – 2016-04-06 (×2): via INTRAVENOUS

## 2016-04-04 MED ORDER — SODIUM CHLORIDE 0.9 % IV SOLN
Freq: Once | INTRAVENOUS | Status: AC
  Administered 2016-04-04: 11:00:00 via INTRAVENOUS

## 2016-04-04 NOTE — Care Management Note (Signed)
Case Management Note  Patient Details  Name: Zachary [name] MRN: [id number] Date of Birth: 11/19/1945  Subjective/Objective:                  Pt is from [facility]. Pt is active with [facility] for nursing services. Romualdo Bolk, of [facility], made aware of referral and will obtain pt info from chart. Anticipate return to ALF, CSW is aware and will make arrangements for return to facility when appropriate. Pt has DSS Guardian.   Action/Plan: If pt discharges over weekend, will need order to resume [facility] services and RN will notify [facility] of DC back to facility.   Expected Discharge Date:     04/07/2016             Expected Discharge Plan:  Assisted Living / Rest Home  In-House Referral:  Clinical Social Work  Discharge planning Services  CM Consult  Post Acute Care Choice:  Home Health, Resumption of Svcs/PTA Provider Choice offered to:  Patient  DME Arranged:    DME Agency:     HH Arranged:  RN [place] Agency:  [place] [facility]  Status of Service:  Completed, signed off  Medicare Important Message Given:    Date Medicare IM Given:    Medicare IM give by:    Date Additional Medicare IM Given:    Additional Medicare Important Message give by:     If discussed at [place] of Stay Meetings, dates discussed:    Additional Comments:  Sherald Barge, RN 04/04/2016, 1:46 PM

## 2016-04-04 NOTE — Consult Note (Addendum)
Reason for Consult: Worsening of renal failure and hyponatremia Referring Physician: [name] is an 70 y.o. male.  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 When he was evaluated patient was found to have elevated pending creatinine above his baseline and also urinary tract infection hence admitted to the hospital. Presently patient offers no complaints. He denies any nausea or vomiting at this moment and he is ready to eat. No difficulty in breathing.  Past Medical History  Diagnosis Date  . Hypertension   . Diabetes mellitus   . Smoking   . Abnormal CT scan, kidney 10/06/2011  . UTI (lower urinary tract infection) 10/06/2011  . Bladder wall thickening 10/06/2011  . 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 Poor historian poor historian  . BPH (benign prostatic hypertrophy)   . Anxiety     mental retardation  . Hypothyroidism   . MR (mental retardation)   . Acute pyelonephritis 10/07/2011  . Hyperkalemia   . Renal insufficiency     chronic history  . Renal failure (ARF), acute on chronic (HCC)   . Anemia     normocytic  . Edema      history of lower extremity edema  . Hyperlipidemia   . Impaired speech   . Uremia   . Hydronephrosis   . 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 Pyelonephritis   . UTI (lower urinary tract infection)   . Heme positive stool   . Protein calorie malnutrition ([place])   . Hypernatremia   . Obstructive uropathy   . Urinary retention     Past Surgical History  Procedure Laterality Date  . Cystoscopy with urethral dilatation N/A 12/29/2013    Procedure: CYSTOSCOPY WITH URETHRAL DILATATION;  Surgeon: Marissa Nestle, MD;  Location: [facility];  Service: Urology;  Laterality: N/A;  . Transurethral resection of prostate N/A 01/04/2014    Procedure:  TRANSURETHRAL RESECTION OF THE PROSTATE (TURP) (procedure #2);  Surgeon: Marissa Nestle, MD;  Location: [facility];  Service: Urology;  Laterality: N/A;  . Circumcision N/A 01/04/2014    Procedure: CIRCUMCISION ADULT (procedure #1);  Surgeon: Marissa Nestle, MD;  Location: [facility];  Service: Urology;  Laterality: N/A;  . Cystoscopy w/ retrogrades Bilateral 06/29/2015    Procedure: CYSTOSCOPY, DILATION OF URETHRAL STRICTURE WITH BILATERAL RETROGRADE PYELOGRAM,SUPRAPUBIC TUBE CHANGE;  Surgeon: Festus Aloe, MD;  Location: [facility];  Service: Urology;  Laterality: Bilateral;  . Av fistula placement Left 07/06/2015    Procedure:  INSERTION LEFT ARM ARTERIOVENOUS GORTEX GRAFT;  Surgeon: Angelia Mould, MD;  Location: [facility];  Service: Vascular;  Laterality: Left;  . Peripheral vascular catheterization N/A 10/08/2015    Procedure: A/V Shuntogram;  Surgeon: Angelia Mould, MD;  Location: [facility];  Service: Cardiovascular;  Laterality: N/A;  . Avgg removal Left 10/09/2015    Procedure: REMOVAL OF ARTERIOVENOUS GORETEX GRAFT ([place]) Evacuation of Lymphocele, Vein Patch angioplasty of brachial artery.;  Surgeon: Angelia Mould, MD;  Location: [facility];  Service: Vascular;  Laterality: Left;  . Av fistula placement Right 02/26/2016    Procedure: ARTERIOVENOUS (AV) FISTULA CREATION ;  Surgeon: Angelia Mould, MD;  Location: [facility];  Service: Vascular;  Laterality: Right;  . Bascilic vein transposition Right 02/26/2016    Procedure: Right BASCILIC VEIN TRANSPOSITION;  Surgeon:  Angelia Mould, MD;  Location: [facility];  Service: Vascular;  Laterality: Right;    Family History  Problem Relation Age of Onset  . Cancer Mother     Social History:  reports that he has never smoked. He has never used smokeless tobacco. He reports that he does not drink alcohol or use illicit drugs.  Allergies: No Known Allergies  Medications: I have reviewed the patient's current  medications.  Results for orders placed or performed during the hospital encounter of 04/03/16 (from the past 48 hour(s))  CBC with Differential/Platelet     Status: Abnormal   Collection Time: 04/03/16  9:35 AM  Result Value Ref Range   WBC 6.3 4.0 - 10.5 K/uL   RBC 2.23 (L) 4.22 - 5.81 MIL/uL   Hemoglobin 7.6 (L) 13.0 - 17.0 g/dL   HCT 23.2 (L) 39.0 - 52.0 %   MCV 104.0 (H) 78.0 - 100.0 fL   MCH 34.1 (H) 26.0 - 34.0 pg   MCHC 32.8 30.0 - 36.0 g/dL   RDW 15.3 11.5 - 15.5 %   Platelets 172 150 - 400 K/uL   Neutrophils Relative % 71 %   Neutro Abs 4.5 1.7 - 7.7 K/uL   Lymphocytes Relative 12 %   Lymphs Abs 0.8 0.7 - 4.0 K/uL   Monocytes Relative 14 %   Monocytes Absolute 0.9 0.1 - 1.0 K/uL   Eosinophils Relative 3 %   Eosinophils Absolute 0.2 0.0 - 0.7 K/uL   Basophils Relative 0 %   Basophils Absolute 0.0 0.0 - 0.1 K/uL  Comprehensive metabolic panel     Status: Abnormal   Collection Time: 04/03/16  9:35 AM  Result Value Ref Range   Sodium 140 135 - 145 mmol/L   Potassium 4.0 3.5 - 5.1 mmol/L   Chloride 114 (H) 101 - 111 mmol/L   CO2 23 22 - 32 mmol/L   Glucose, Bld 156 (H) 65 - 99 mg/dL   BUN 26 (H) 6 - 20 mg/dL   Creatinine, Ser 3.26 (H) 0.61 - 1.24 mg/dL   Calcium 8.3 (L) 8.9 - 10.3 mg/dL   Total Protein 6.0 (L) 6.5 - 8.1 g/dL   Albumin 3.0 (L) 3.5 - 5.0 g/dL   AST 13 (L) 15 - 41 U/L   ALT 12 (L) 17 - 63 U/L   Alkaline Phosphatase 66 38 - 126 U/L   Total Bilirubin 0.4 0.3 - 1.2 mg/dL   GFR calc non Af Amer 18 (L) >60 mL/min   GFR calc Af Amer 21 (L) >60 mL/min    Comment: (NOTE) The eGFR has been calculated using the CKD EPI equation. This calculation has not been validated in all clinical situations. eGFR's persistently <60 mL/min signify possible Chronic Kidney Disease.    Anion gap 3 (L) 5 - 15  TSH     Status: None   Collection Time: 04/03/16  9:35 AM  Result Value Ref Range   TSH 1.071 0.350 - 4.500 uIU/mL  Urinalysis, Routine w reflex microscopic (not  at [facility])     Status: Abnormal   Collection Time: 04/03/16  9:52 AM  Result Value Ref Range   Color, Urine YELLOW YELLOW   APPearance TURBID (A) CLEAR   Specific Gravity, Urine 1.020 1.005 - 1.030   pH 6.0 5.0 - 8.0   Glucose, UA NEGATIVE NEGATIVE mg/dL   Hgb urine dipstick LARGE (A) NEGATIVE   Bilirubin Urine NEGATIVE NEGATIVE   Ketones, ur NEGATIVE NEGATIVE mg/dL   Protein,  ur >300 (A) NEGATIVE mg/dL   Nitrite NEGATIVE NEGATIVE   Leukocytes, UA LARGE (A) NEGATIVE  Urine microscopic-add on     Status: Abnormal   Collection Time: 04/03/16  9:52 AM  Result Value Ref Range   Squamous Epithelial / LPF NONE SEEN NONE SEEN   WBC, UA TOO NUMEROUS TO COUNT 0 - 5 WBC/hpf   RBC / HPF TOO NUMEROUS TO COUNT 0 - 5 RBC/hpf   Bacteria, UA MANY (A) NONE SEEN  Basic metabolic panel     Status: Abnormal   Collection Time: 04/04/16  5:08 AM  Result Value Ref Range   Sodium 142 135 - 145 mmol/L   Potassium 3.7 3.5 - 5.1 mmol/L   Chloride 116 (H) 101 - 111 mmol/L   CO2 22 22 - 32 mmol/L   Glucose, Bld 107 (H) 65 - 99 mg/dL   BUN 25 (H) 6 - 20 mg/dL   Creatinine, Ser 2.94 (H) 0.61 - 1.24 mg/dL   Calcium 8.2 (L) 8.9 - 10.3 mg/dL   GFR calc non Af Amer 21 (L) >60 mL/min   GFR calc Af Amer 24 (L) >60 mL/min    Comment: (NOTE) The eGFR has been calculated using the CKD EPI equation. This calculation has not been validated in all clinical situations. eGFR's persistently <60 mL/min signify possible Chronic Kidney Disease.    Anion gap 4 (L) 5 - 15  CBC     Status: Abnormal   Collection Time: 04/04/16  5:08 AM  Result Value Ref Range   WBC 4.9 4.0 - 10.5 K/uL   RBC 2.06 (L) 4.22 - 5.81 MIL/uL   Hemoglobin 7.0 (L) 13.0 - 17.0 g/dL   HCT 21.2 (L) 39.0 - 52.0 %   MCV 102.9 (H) 78.0 - 100.0 fL   MCH 34.0 26.0 - 34.0 pg   MCHC 33.0 30.0 - 36.0 g/dL   RDW 15.0 11.5 - 15.5 %   Platelets 137 (L) 150 - 400 K/uL    US Renal  04/03/2016  CLINICAL DATA:  Pain from catheter.  Renal insufficiency. EXAM:  RENAL / URINARY TRACT ULTRASOUND COMPLETE COMPARISON:  CT abdomen dated 03/10/2016. Also abdomen ultrasound dated 05/24/2015. FINDINGS: Right Kidney: Length: 11.4 cm. Increased cortical echogenicity, similar to the previous abdomen ultrasound. Stable mild hydronephrosis. Left Kidney: Length: 10 cm. Increased renal cortex echogenicity. Benign-appearing cyst within the midpole region of the left kidney, measuring approximately 2.8 cm greatest dimension, probably stable given differences in measurement technique. No hydronephrosis. Bladder: Bladder decompressed by Foley catheter. Bladder wall thickness measured at 1.3 cm, probably accentuated to some degree by the bladder decompression. IMPRESSION: 1. Bladder decompressed by Foley catheter. Bladder wall appears thickened, measured at 1.3 cm thickness, probably accentuated to some degree by the bladder decompression. Recommend correlation with urinalysis to exclude associated cystitis. 2. Stable mild right-sided hydronephrosis. 3. Echogenic renal cortices bilaterally suggesting chronic medical renal disease. These results will be called to the ordering clinician or representative by the Radiologist Assistant, and communication documented in the PACS or zVision Dashboard. Electronically Signed   By: Franki Cabot M.D.   On: 04/03/2016 13:37   US Venous Img Lower Unilateral Right  04/03/2016  CLINICAL DATA:  Right lower extremity edema a EXAM: RIGHT LOWER EXTREMITY VENOUS DOPPLER ULTRASOUND TECHNIQUE: Gray-scale sonography with compression, as well as color and duplex ultrasound, were performed to evaluate the deep venous system from the level of the common femoral vein through the popliteal and proximal calf veins. COMPARISON:  None  FINDINGS: Normal compressibility of the common femoral, superficial femoral, and popliteal veins, as well as the proximal calf veins. No filling defects to suggest DVT on grayscale or color Doppler imaging. Doppler waveforms show normal  direction of venous flow, normal respiratory phasicity and response to augmentation. Moderate right calf subcutaneous edema limits evaluation of calf veins. Survey views of the contralateral common femoral vein are unremarkable. IMPRESSION: No evidence of right lower extremity deep vein thrombosis. Electronically Signed   By: Lucrezia Europe M.D.   On: 04/03/2016 10:55    Review of Systems  Constitutional: Negative for fever and chills.  Respiratory: Negative for shortness of breath.   Cardiovascular: Negative for chest pain, orthopnea and claudication.  Gastrointestinal: Positive for nausea and vomiting. Negative for abdominal pain.   Blood pressure 135/73, pulse 98, temperature 99.1 F (37.3 C), temperature source Oral, resp. rate 20, height _0  (1.702 m), weight 79.379 kg (175 lb), SpO2 100 %. Physical Exam  Constitutional: He is oriented to person, place, and time. He appears distressed.  Eyes: No scleral icterus.  Neck: No JVD present.  Cardiovascular: Normal rate and regular rhythm.   No murmur heard. Respiratory: He has no wheezes. He has no rales.  GI: He exhibits no distension.  Musculoskeletal: He exhibits edema.  Neurological: He is oriented to person, place, and time.    Assessment/Plan: Problem #1 acute kidney injury superimposed on chronic. Presently patient with Foley catheter. His BUN and creatinine is improving. This could be secondary to prerenal syndrome. Patient is non-oliguric. Problem #2 chronic obstructive uropathy: Patient is with Foley catheter Problem #3 UTI Problem #4 anemia: His hemoglobin has declined. Patient with history of iron deficiency anemia and anemia of chronic disease. Problem #5 history of hypothyroidism Problem #6 diabetes Problem #7 metabolic bone disease his calcium is range. Presently he is not on a binder. Plan: 1] We'll check his urine sodium, creatinine, today          2]We'll start patient on normal saline at 100 mL per hour           3]We'll do iron studies tomorrow and check his renal panel and CBC in the morning         4]We'll start patient on Lasix 40 mg IV twice a day  [facility] 04/04/2016, 8:56 AM

## 2016-04-04 NOTE — Progress Notes (Signed)
Subjective: This is a 70 years old male patient was admitted yesterday due to UTI and acute on chronic renal failure. Patient also has bilateral leg edema and anemia.  Objective: Vital signs in last 24 hours: Temp:  [98.9 F (37.2 C)-99.1 F (37.3 C)] 99.1 F (37.3 C) (06/09 0508) Pulse Rate:  [92-112] 98 (06/09 0508) Resp:  [18-20] 20 (06/09 0508) BP: (131-185)/(72-97) 135/73 mmHg (06/09 0508) SpO2:  [96 %-100 %] 100 % (06/09 0508) Weight:  [78.472 kg (173 lb)-79.379 kg (175 lb)] 79.379 kg (175 lb) (06/08 1700) Weight change:     Intake/Output from previous day: 06/08 0701 - 06/09 0700 In: 240 [P.O.:240] Out: 800 [Urine:800]  PHYSICAL EXAM General appearance: no distress and slowed mentation Resp: diminished breath sounds bilaterally and rhonchi bilaterally Cardio: S1, S2 normal GI: soft, non-tender; bowel sounds normal; no masses,  no organomegaly Extremities: 3+++ leg edema  Lab Results:  Results for orders placed or performed during the hospital encounter of 04/03/16 (from the past 48 hour(s))  CBC with Differential/Platelet     Status: Abnormal   Collection Time: 04/03/16  9:35 AM  Result Value Ref Range   WBC 6.3 4.0 - 10.5 K/uL   RBC 2.23 (L) 4.22 - 5.81 MIL/uL   Hemoglobin 7.6 (L) 13.0 - 17.0 g/dL   HCT 23.2 (L) 39.0 - 52.0 %   MCV 104.0 (H) 78.0 - 100.0 fL   MCH 34.1 (H) 26.0 - 34.0 pg   MCHC 32.8 30.0 - 36.0 g/dL   RDW 15.3 11.5 - 15.5 %   Platelets 172 150 - 400 K/uL   Neutrophils Relative % 71 %   Neutro Abs 4.5 1.7 - 7.7 K/uL   Lymphocytes Relative 12 %   Lymphs Abs 0.8 0.7 - 4.0 K/uL   Monocytes Relative 14 %   Monocytes Absolute 0.9 0.1 - 1.0 K/uL   Eosinophils Relative 3 %   Eosinophils Absolute 0.2 0.0 - 0.7 K/uL   Basophils Relative 0 %   Basophils Absolute 0.0 0.0 - 0.1 K/uL  Comprehensive metabolic panel     Status: Abnormal   Collection Time: 04/03/16  9:35 AM  Result Value Ref Range   Sodium 140 135 - 145 mmol/L   Potassium 4.0 3.5 - 5.1  mmol/L   Chloride 114 (H) 101 - 111 mmol/L   CO2 23 22 - 32 mmol/L   Glucose, Bld 156 (H) 65 - 99 mg/dL   BUN 26 (H) 6 - 20 mg/dL   Creatinine, Ser 3.26 (H) 0.61 - 1.24 mg/dL   Calcium 8.3 (L) 8.9 - 10.3 mg/dL   Total Protein 6.0 (L) 6.5 - 8.1 g/dL   Albumin 3.0 (L) 3.5 - 5.0 g/dL   AST 13 (L) 15 - 41 U/L   ALT 12 (L) 17 - 63 U/L   Alkaline Phosphatase 66 38 - 126 U/L   Total Bilirubin 0.4 0.3 - 1.2 mg/dL   GFR calc non Af Amer 18 (L) >60 mL/min   GFR calc Af Amer 21 (L) >60 mL/min    Comment: (NOTE) The eGFR has been calculated using the CKD EPI equation. This calculation has not been validated in all clinical situations. eGFR's persistently <60 mL/min signify possible Chronic Kidney Disease.    Anion gap 3 (L) 5 - 15  TSH     Status: None   Collection Time: 04/03/16  9:35 AM  Result Value Ref Range   TSH 1.071 0.350 - 4.500 uIU/mL  Urinalysis, Routine w reflex microscopic (  not at [facility])     Status: Abnormal   Collection Time: 04/03/16  9:52 AM  Result Value Ref Range   Color, Urine YELLOW YELLOW   APPearance TURBID (A) CLEAR   Specific Gravity, Urine 1.020 1.005 - 1.030   pH 6.0 5.0 - 8.0   Glucose, UA NEGATIVE NEGATIVE mg/dL   Hgb urine dipstick LARGE (A) NEGATIVE   Bilirubin Urine NEGATIVE NEGATIVE   Ketones, ur NEGATIVE NEGATIVE mg/dL   Protein, ur >300 (A) NEGATIVE mg/dL   Nitrite NEGATIVE NEGATIVE   Leukocytes, UA LARGE (A) NEGATIVE  Urine microscopic-add on     Status: Abnormal   Collection Time: 04/03/16  9:52 AM  Result Value Ref Range   Squamous Epithelial / LPF NONE SEEN NONE SEEN   WBC, UA TOO NUMEROUS TO COUNT 0 - 5 WBC/hpf   RBC / HPF TOO NUMEROUS TO COUNT 0 - 5 RBC/hpf   Bacteria, UA MANY (A) NONE SEEN  Basic metabolic panel     Status: Abnormal   Collection Time: 04/04/16  5:08 AM  Result Value Ref Range   Sodium 142 135 - 145 mmol/L   Potassium 3.7 3.5 - 5.1 mmol/L   Chloride 116 (H) 101 - 111 mmol/L   CO2 22 22 - 32 mmol/L   Glucose, Bld 107 (H)  65 - 99 mg/dL   BUN 25 (H) 6 - 20 mg/dL   Creatinine, Ser 2.94 (H) 0.61 - 1.24 mg/dL   Calcium 8.2 (L) 8.9 - 10.3 mg/dL   GFR calc non Af Amer 21 (L) >60 mL/min   GFR calc Af Amer 24 (L) >60 mL/min    Comment: (NOTE) The eGFR has been calculated using the CKD EPI equation. This calculation has not been validated in all clinical situations. eGFR's persistently <60 mL/min signify possible Chronic Kidney Disease.    Anion gap 4 (L) 5 - 15  CBC     Status: Abnormal   Collection Time: 04/04/16  5:08 AM  Result Value Ref Range   WBC 4.9 4.0 - 10.5 K/uL   RBC 2.06 (L) 4.22 - 5.81 MIL/uL   Hemoglobin 7.0 (L) 13.0 - 17.0 g/dL   HCT 21.2 (L) 39.0 - 52.0 %   MCV 102.9 (H) 78.0 - 100.0 fL   MCH 34.0 26.0 - 34.0 pg   MCHC 33.0 30.0 - 36.0 g/dL   RDW 15.0 11.5 - 15.5 %   Platelets 137 (L) 150 - 400 K/uL    ABGS No results for input(s): PHART, PO2ART, TCO2, HCO3 in the last 72 hours.  Invalid input(s): PCO2 CULTURES No results found for this or any previous visit (from the past 240 hour(s)). Studies/Results: US Renal  04/03/2016  CLINICAL DATA:  Pain from catheter.  Renal insufficiency. EXAM: RENAL / URINARY TRACT ULTRASOUND COMPLETE COMPARISON:  CT abdomen dated 03/10/2016. Also abdomen ultrasound dated 05/24/2015. FINDINGS: Right Kidney: Length: 11.4 cm. Increased cortical echogenicity, similar to the previous abdomen ultrasound. Stable mild hydronephrosis. Left Kidney: Length: 10 cm. Increased renal cortex echogenicity. Benign-appearing cyst within the midpole region of the left kidney, measuring approximately 2.8 cm greatest dimension, probably stable given differences in measurement technique. No hydronephrosis. Bladder: Bladder decompressed by Foley catheter. Bladder wall thickness measured at 1.3 cm, probably accentuated to some degree by the bladder decompression. IMPRESSION: 1. Bladder decompressed by Foley catheter. Bladder wall appears thickened, measured at 1.3 cm thickness, probably  accentuated to some degree by the bladder decompression. Recommend correlation with urinalysis to exclude associated cystitis. 2.  Stable mild right-sided hydronephrosis. 3. Echogenic renal cortices bilaterally suggesting chronic medical renal disease. These results will be called to the ordering clinician or representative by the Radiologist Assistant, and communication documented in the PACS or zVision Dashboard. Electronically Signed   By: Franki Cabot M.D.   On: 04/03/2016 13:37   US Venous Img Lower Unilateral Right  04/03/2016  CLINICAL DATA:  Right lower extremity edema a EXAM: RIGHT LOWER EXTREMITY VENOUS DOPPLER ULTRASOUND TECHNIQUE: Gray-scale sonography with compression, as well as color and duplex ultrasound, were performed to evaluate the deep venous system from the level of the common femoral vein through the popliteal and proximal calf veins. COMPARISON:  None FINDINGS: Normal compressibility of the common femoral, superficial femoral, and popliteal veins, as well as the proximal calf veins. No filling defects to suggest DVT on grayscale or color Doppler imaging. Doppler waveforms show normal direction of venous flow, normal respiratory phasicity and response to augmentation. Moderate right calf subcutaneous edema limits evaluation of calf veins. Survey views of the contralateral common femoral vein are unremarkable. IMPRESSION: No evidence of right lower extremity deep vein thrombosis. Electronically Signed   By: Lucrezia Europe M.D.   On: 04/03/2016 10:55    Medications: I have reviewed the patient's current medications.  Assesment:   Active Problems:   DM (diabetes mellitus), type 2, uncontrolled ([place])   Acute pyelonephritis   ARF (acute renal failure) (HCC)   Acute renal failure superimposed on stage 4 chronic kidney disease (HCC)   Essential hypertension   UTI (lower urinary tract infection)   AKI (acute kidney injury) ([place]) Anemia  hyponatremai   Plan:  Mediations reviewed Will  continue IV antibiotics Will transfuse 2 units PRBC Nephrology consult  Will monitor CBC and BMP      Armonie Mettler 04/04/2016, 8:03 AM

## 2016-04-04 NOTE — Clinical Social Work Note (Signed)
Clinical Social Work Assessment  Patient Details  Name: Zachary [name] MRN: [id number] Date of Birth: 12/03/1945  Date of referral:  04/04/16               Reason for consult:   (From [facility])                Permission sought to share information with:    Permission granted to share information::     Name::        Agency::     Relationship::     Contact Information:     Housing/Transportation Living arrangements for the past 2 months:  [place] of Information:  Patient, Facility Patient Interpreter Needed:  None Criminal Activity/Legal Involvement Pertinent to Current Situation/Hospitalization:  No - Comment as needed Significant Relationships:  Siblings Lives with:  Facility Resident Do you feel safe going back to the place where you live?  Yes Need for family participation in patient care:  Yes (Comment)  Care giving concerns:  None identified.   Social Worker assessment / plan:  Patient stated that he was a resident at [facility] and has been for a "long time." Patient advised that he ambulates unassisted and staff assist with bathing.  He stated that her brother visits with her on a regular basis.  He stated that he plans on returning to [facility] discharge. CSW left a message for guardian [name], [place].  CSW spoke with Tammy at [facility]. She confirmed patient's statements and advised that he could return at discharge.    Employment status:  Disabled (Comment on whether or not currently receiving Disability) Insurance information:  Medicaid [place] Recommendations:  Not assessed at this time Information / Referral to community resources:     Patient/Family's Response to care: Patient agreeable to return to [place] at discharge.   Patient/Family's Understanding of and Emotional Response to Diagnosis, Current Treatment, and Prognosis:  Patient likely does not have the cognitive ability to fully understand his diagnosis, treatment  and prognosis.    Emotional Assessment Appearance:  Appears stated age Attitude/Demeanor/Rapport:   (Cooperative) Affect (typically observed):  Accepting Orientation:  Oriented to Self, Oriented to Situation, Oriented to Place Alcohol / Substance use:  Not Applicable Psych involvement (Current and /or in the community):  No (Comment)  Discharge Needs  Concerns to be addressed:  Discharge Planning Concerns Readmission within the last 30 days:  Yes Current discharge risk:  Chronically ill, Cognitively Impaired Barriers to Discharge:  No Barriers Identified   Ihor Gully, LCSW 04/04/2016, 1:54 PM

## 2016-04-05 LAB — TYPE AND SCREEN
ABO/RH(D): B POS
Antibody Screen: NEGATIVE
Unit division: 0
Unit division: 0

## 2016-04-05 LAB — IRON AND TIBC
Iron: 21 ug/dL — ABNORMAL LOW (ref 45–182)
Saturation Ratios: 12 % — ABNORMAL LOW (ref 17.9–39.5)
TIBC: 175 ug/dL — ABNORMAL LOW (ref 250–450)
UIBC: 154 ug/dL

## 2016-04-05 LAB — RENAL FUNCTION PANEL
Albumin: 2.6 g/dL — ABNORMAL LOW (ref 3.5–5.0)
Anion gap: 5 (ref 5–15)
BUN: 23 mg/dL — ABNORMAL HIGH (ref 6–20)
CO2: 23 mmol/L (ref 22–32)
Calcium: 8.3 mg/dL — ABNORMAL LOW (ref 8.9–10.3)
Chloride: 114 mmol/L — ABNORMAL HIGH (ref 101–111)
Creatinine, Ser: 2.9 mg/dL — ABNORMAL HIGH (ref 0.61–1.24)
GFR calc Af Amer: 24 mL/min — ABNORMAL LOW (ref >60)
GFR calc non Af Amer: 21 mL/min — ABNORMAL LOW (ref >60)
Glucose, Bld: 102 mg/dL — ABNORMAL HIGH (ref 65–99)
Phosphorus: 3 mg/dL (ref 2.5–4.6)
Potassium: 3.9 mmol/L (ref 3.5–5.1)
Sodium: 142 mmol/L (ref 135–145)

## 2016-04-05 LAB — CBC
HCT: 26.1 % — ABNORMAL LOW (ref 39.0–52.0)
HCT: 27.1 % — ABNORMAL LOW (ref 39.0–52.0)
Hemoglobin: 8.9 g/dL — ABNORMAL LOW (ref 13.0–17.0)
Hemoglobin: 9 g/dL — ABNORMAL LOW (ref 13.0–17.0)
MCH: 32.6 pg (ref 26.0–34.0)
MCH: 31.9 pg (ref 26.0–34.0)
MCHC: 34.1 g/dL (ref 30.0–36.0)
MCHC: 33.2 g/dL (ref 30.0–36.0)
MCV: 95.6 fL (ref 78.0–100.0)
MCV: 96.1 fL (ref 78.0–100.0)
Platelets: 130 10*3/uL — ABNORMAL LOW (ref 150–400)
Platelets: 136 10*3/uL — ABNORMAL LOW (ref 150–400)
RBC: 2.73 MIL/uL — ABNORMAL LOW (ref 4.22–5.81)
RBC: 2.82 MIL/uL — ABNORMAL LOW (ref 4.22–5.81)
RDW: 19.8 % — ABNORMAL HIGH (ref 11.5–15.5)
RDW: 19.9 % — ABNORMAL HIGH (ref 11.5–15.5)
WBC: 6.7 10*3/uL (ref 4.0–10.5)
WBC: 7.2 10*3/uL (ref 4.0–10.5)

## 2016-04-05 LAB — URINE CULTURE: Culture: >=100000 — AB

## 2016-04-05 LAB — FERRITIN: Ferritin: 37 ng/mL (ref 24–336)

## 2016-04-05 NOTE — Progress Notes (Signed)
Subjective: Interval History: has no complaint of nausea or vomiting. Patient also denies any difficulty breathing..  Objective: Vital signs in last 24 hours: Temp:  [97.6 F (36.4 C)-99.5 F (37.5 C)] 97.6 F (36.4 C) (06/10 0642) Pulse Rate:  [90-100] 98 (06/10 0642) Resp:  [18-20] 20 (06/10 0642) BP: (97-142)/(67-80) 122/68 mmHg (06/10 0642) SpO2:  [94 %-100 %] 98 % (06/10 0642) Weight change:   Intake/Output from previous day: 06/09 0701 - 06/10 0700 In: 1457 [P.O.:480; I.V.:275; Blood:702] Out: 2050 [Urine:2050] Intake/Output this shift:    General appearance: alert, cooperative and no distress Resp: clear to auscultation bilaterally Cardio: regular rate and rhythm, S1, S2 normal, no murmur, click, rub or gallop GI: soft, non-tender; bowel sounds normal; no masses,  no organomegaly Extremities: edema 1+ on the right and trace on the left  Lab Results:  Recent Labs  04/05/16 0230 04/05/16 0532  WBC 6.7 7.2  HGB 8.9* 9.0*  HCT 26.1* 27.1*  PLT 130* 136*   BMET:  Recent Labs  04/04/16 0508 04/05/16 0532  NA 142 142  K 3.7 3.9  CL 116* 114*  CO2 22 23  GLUCOSE 107* 102*  BUN 25* 23*  CREATININE 2.94* 2.90*  CALCIUM 8.2* 8.3*   No results for input(s): PTH in the last 72 hours. Iron Studies: No results for input(s): IRON, TIBC, TRANSFERRIN, FERRITIN in the last 72 hours.  Studies/Results: US Renal  04/03/2016  CLINICAL DATA:  Pain from catheter.  Renal insufficiency. EXAM: RENAL / URINARY TRACT ULTRASOUND COMPLETE COMPARISON:  CT abdomen dated 03/10/2016. Also abdomen ultrasound dated 05/24/2015. FINDINGS: Right Kidney: Length: 11.4 cm. Increased cortical echogenicity, similar to the previous abdomen ultrasound. Stable mild hydronephrosis. Left Kidney: Length: 10 cm. Increased renal cortex echogenicity. Benign-appearing cyst within the midpole region of the left kidney, measuring approximately 2.8 cm greatest dimension, probably stable given differences in  measurement technique. No hydronephrosis. Bladder: Bladder decompressed by Foley catheter. Bladder wall thickness measured at 1.3 cm, probably accentuated to some degree by the bladder decompression. IMPRESSION: 1. Bladder decompressed by Foley catheter. Bladder wall appears thickened, measured at 1.3 cm thickness, probably accentuated to some degree by the bladder decompression. Recommend correlation with urinalysis to exclude associated cystitis. 2. Stable mild right-sided hydronephrosis. 3. Echogenic renal cortices bilaterally suggesting chronic medical renal disease. These results will be called to the ordering clinician or representative by the Radiologist Assistant, and communication documented in the PACS or zVision Dashboard. Electronically Signed   By: Franki Cabot M.D.   On: 04/03/2016 13:37   US Venous Img Lower Unilateral Right  04/03/2016  CLINICAL DATA:  Right lower extremity edema a EXAM: RIGHT LOWER EXTREMITY VENOUS DOPPLER ULTRASOUND TECHNIQUE: Gray-scale sonography with compression, as well as color and duplex ultrasound, were performed to evaluate the deep venous system from the level of the common femoral vein through the popliteal and proximal calf veins. COMPARISON:  None FINDINGS: Normal compressibility of the common femoral, superficial femoral, and popliteal veins, as well as the proximal calf veins. No filling defects to suggest DVT on grayscale or color Doppler imaging. Doppler waveforms show normal direction of venous flow, normal respiratory phasicity and response to augmentation. Moderate right calf subcutaneous edema limits evaluation of calf veins. Survey views of the contralateral common femoral vein are unremarkable. IMPRESSION: No evidence of right lower extremity deep vein thrombosis. Electronically Signed   By: Lucrezia Europe M.D.   On: 04/03/2016 10:55    I have reviewed the patient's current medications.  Assessment/Plan:  Problem #1 acute kidney injury superimposed on  chronic. Presently his renal function is improving. Patient is nonoliguric and about 2000 mL of urine output the last 24 hours. Problem #2 chronic renal failure: Stage IV. A combination of obstructive uropathy/diabetes/recurrent UTI. Problem #3 anemia: His hemoglobin is stable. Possibly recommendation of iron deficiency and anemia of chronic disease. Iron studies are pending from today. Problem #4 metabolic bone disease: His calcium and phosphorus is range Problem #5 history of diabetes Problem #6 hypertension Problem #7 tract infection. Presently patient is afebrile and his white blood cell count is normal. Culture is pending Plan: We'll continue his present management We will check his renal panel in the morning.     LOS: 1 day   Cimone Fahey S 04/05/2016,8:46 AM

## 2016-04-05 NOTE — Progress Notes (Signed)
Subjective: Patient is resting. He feels better. He is diuresing well.  He is transfused  2 units of PRBC.   Objective: Vital signs in last 24 hours: Temp:  [97.6 F (36.4 C)-99.5 F (37.5 C)] 97.6 F (36.4 C) (06/10 3299) Pulse Rate:  [90-100] 98 (06/10 0642) Resp:  [18-20] 20 (06/10 0642) BP: (97-142)/(67-80) 122/68 mmHg (06/10 0642) SpO2:  [94 %-100 %] 98 % (06/10 0642) Weight change:     Intake/Output from previous day: 06/09 0701 - 06/10 0700 In: 1457 [P.O.:480; I.V.:275; Blood:702] Out: 2050 [Urine:2050]  PHYSICAL EXAM General appearance: no distress and slowed mentation Resp: diminished breath sounds bilaterally and rhonchi bilaterally Cardio: S1, S2 normal GI: soft, non-tender; bowel sounds normal; no masses,  no organomegaly Extremities: 3+++ leg edema  Lab Results:  Results for orders placed or performed during the hospital encounter of 04/03/16 (from the past 48 hour(s))  CBC with Differential/Platelet     Status: Abnormal   Collection Time: 04/03/16  9:35 AM  Result Value Ref Range   WBC 6.3 4.0 - 10.5 K/uL   RBC 2.23 (L) 4.22 - 5.81 MIL/uL   Hemoglobin 7.6 (L) 13.0 - 17.0 g/dL   HCT 23.2 (L) 39.0 - 52.0 %   MCV 104.0 (H) 78.0 - 100.0 fL   MCH 34.1 (H) 26.0 - 34.0 pg   MCHC 32.8 30.0 - 36.0 g/dL   RDW 15.3 11.5 - 15.5 %   Platelets 172 150 - 400 K/uL   Neutrophils Relative % 71 %   Neutro Abs 4.5 1.7 - 7.7 K/uL   Lymphocytes Relative 12 %   Lymphs Abs 0.8 0.7 - 4.0 K/uL   Monocytes Relative 14 %   Monocytes Absolute 0.9 0.1 - 1.0 K/uL   Eosinophils Relative 3 %   Eosinophils Absolute 0.2 0.0 - 0.7 K/uL   Basophils Relative 0 %   Basophils Absolute 0.0 0.0 - 0.1 K/uL  Comprehensive metabolic panel     Status: Abnormal   Collection Time: 04/03/16  9:35 AM  Result Value Ref Range   Sodium 140 135 - 145 mmol/L   Potassium 4.0 3.5 - 5.1 mmol/L   Chloride 114 (H) 101 - 111 mmol/L   CO2 23 22 - 32 mmol/L   Glucose, Bld 156 (H) 65 - 99 mg/dL   BUN 26  (H) 6 - 20 mg/dL   Creatinine, Ser 3.26 (H) 0.61 - 1.24 mg/dL   Calcium 8.3 (L) 8.9 - 10.3 mg/dL   Total Protein 6.0 (L) 6.5 - 8.1 g/dL   Albumin 3.0 (L) 3.5 - 5.0 g/dL   AST 13 (L) 15 - 41 U/L   ALT 12 (L) 17 - 63 U/L   Alkaline Phosphatase 66 38 - 126 U/L   Total Bilirubin 0.4 0.3 - 1.2 mg/dL   GFR calc non Af Amer 18 (L) >60 mL/min   GFR calc Af Amer 21 (L) >60 mL/min    Comment: (NOTE) The eGFR has been calculated using the CKD EPI equation. This calculation has not been validated in all clinical situations. eGFR's persistently <60 mL/min signify possible Chronic Kidney Disease.    Anion gap 3 (L) 5 - 15  TSH     Status: None   Collection Time: 04/03/16  9:35 AM  Result Value Ref Range   TSH 1.071 0.350 - 4.500 uIU/mL  Osmolality     Status: Abnormal   Collection Time: 04/03/16  9:35 AM  Result Value Ref Range   Osmolality 308 (H) 275 -  295 mOsm/kg    Comment: Performed at [facility]  Urinalysis, Routine w reflex microscopic (not at [facility])     Status: Abnormal   Collection Time: 04/03/16  9:52 AM  Result Value Ref Range   Color, Urine YELLOW YELLOW   APPearance TURBID (A) CLEAR   Specific Gravity, Urine 1.020 1.005 - 1.030   pH 6.0 5.0 - 8.0   Glucose, UA NEGATIVE NEGATIVE mg/dL   Hgb urine dipstick LARGE (A) NEGATIVE   Bilirubin Urine NEGATIVE NEGATIVE   Ketones, ur NEGATIVE NEGATIVE mg/dL   Protein, ur >300 (A) NEGATIVE mg/dL   Nitrite NEGATIVE NEGATIVE   Leukocytes, UA LARGE (A) NEGATIVE  Urine microscopic-add on     Status: Abnormal   Collection Time: 04/03/16  9:52 AM  Result Value Ref Range   Squamous Epithelial / LPF NONE SEEN NONE SEEN   WBC, UA TOO NUMEROUS TO COUNT 0 - 5 WBC/hpf   RBC / HPF TOO NUMEROUS TO COUNT 0 - 5 RBC/hpf   Bacteria, UA MANY (A) NONE SEEN  Urine culture     Status: Abnormal   Collection Time: 04/03/16 10:15 AM  Result Value Ref Range   Specimen Description URINE, CATHETERIZED    Special Requests NONE    Culture >=100,000  COLONIES/mL ESCHERICHIA COLI (A)    Report Status 04/05/2016 FINAL    Organism ID, Bacteria ESCHERICHIA COLI (A)       Susceptibility   Escherichia coli - MIC*    AMPICILLIN 8 SENSITIVE Sensitive     CEFAZOLIN <=4 SENSITIVE Sensitive     CEFTRIAXONE <=1 SENSITIVE Sensitive     CIPROFLOXACIN <=0.25 SENSITIVE Sensitive     GENTAMICIN <=1 SENSITIVE Sensitive     IMIPENEM <=0.25 SENSITIVE Sensitive     NITROFURANTOIN <=16 SENSITIVE Sensitive     TRIMETH/SULFA <=20 SENSITIVE Sensitive     AMPICILLIN/SULBACTAM 4 SENSITIVE Sensitive     PIP/TAZO <=4 SENSITIVE Sensitive     * >=100,000 COLONIES/mL ESCHERICHIA COLI  Basic metabolic panel     Status: Abnormal   Collection Time: 04/04/16  5:08 AM  Result Value Ref Range   Sodium 142 135 - 145 mmol/L   Potassium 3.7 3.5 - 5.1 mmol/L   Chloride 116 (H) 101 - 111 mmol/L   CO2 22 22 - 32 mmol/L   Glucose, Bld 107 (H) 65 - 99 mg/dL   BUN 25 (H) 6 - 20 mg/dL   Creatinine, Ser 2.94 (H) 0.61 - 1.24 mg/dL   Calcium 8.2 (L) 8.9 - 10.3 mg/dL   GFR calc non Af Amer 21 (L) >60 mL/min   GFR calc Af Amer 24 (L) >60 mL/min    Comment: (NOTE) The eGFR has been calculated using the CKD EPI equation. This calculation has not been validated in all clinical situations. eGFR's persistently <60 mL/min signify possible Chronic Kidney Disease.    Anion gap 4 (L) 5 - 15  CBC     Status: Abnormal   Collection Time: 04/04/16  5:08 AM  Result Value Ref Range   WBC 4.9 4.0 - 10.5 K/uL   RBC 2.06 (L) 4.22 - 5.81 MIL/uL   Hemoglobin 7.0 (L) 13.0 - 17.0 g/dL   HCT 21.2 (L) 39.0 - 52.0 %   MCV 102.9 (H) 78.0 - 100.0 fL   MCH 34.0 26.0 - 34.0 pg   MCHC 33.0 30.0 - 36.0 g/dL   RDW 15.0 11.5 - 15.5 %   Platelets 137 (L) 150 - 400 K/uL  Prepare  RBC     Status: None   Collection Time: 04/04/16  8:26 AM  Result Value Ref Range   Order Confirmation ORDER PROCESSED BY BLOOD BANK   Type and screen [facility]     Status: None (Preliminary result)   Collection  Time: 04/04/16  8:26 AM  Result Value Ref Range   ABO/RH(D) B POS    Antibody Screen NEG    Sample Expiration 04/07/2016    Unit Number [id number]    Blood Component Type RBC LR PHER2    Unit division 00    Status of Unit ISSUED    Transfusion Status OK TO TRANSFUSE    Crossmatch Result Compatible    Unit Number [id number]    Blood Component Type RED CELLS,LR    Unit division 00    Status of Unit ISSUED    Transfusion Status OK TO TRANSFUSE    Crossmatch Result Compatible   Sodium, urine, random     Status: None   Collection Time: 04/04/16  6:00 PM  Result Value Ref Range   Sodium, Ur 116 mmol/L  Osmolality, urine     Status: None   Collection Time: 04/04/16  6:00 PM  Result Value Ref Range   Osmolality, Ur 424 300 - 900 mOsm/kg    Comment: Performed at [facility]  CBC     Status: Abnormal   Collection Time: 04/05/16  2:30 AM  Result Value Ref Range   WBC 6.7 4.0 - 10.5 K/uL   RBC 2.73 (L) 4.22 - 5.81 MIL/uL   Hemoglobin 8.9 (L) 13.0 - 17.0 g/dL    Comment: POST TRANSFUSION SPECIMEN   HCT 26.1 (L) 39.0 - 52.0 %   MCV 95.6 78.0 - 100.0 fL    Comment: DELTA CHECK NOTED   MCH 32.6 26.0 - 34.0 pg   MCHC 34.1 30.0 - 36.0 g/dL   RDW 19.8 (H) 11.5 - 15.5 %   Platelets 130 (L) 150 - 400 K/uL  CBC     Status: Abnormal   Collection Time: 04/05/16  5:32 AM  Result Value Ref Range   WBC 7.2 4.0 - 10.5 K/uL   RBC 2.82 (L) 4.22 - 5.81 MIL/uL   Hemoglobin 9.0 (L) 13.0 - 17.0 g/dL   HCT 27.1 (L) 39.0 - 52.0 %   MCV 96.1 78.0 - 100.0 fL   MCH 31.9 26.0 - 34.0 pg   MCHC 33.2 30.0 - 36.0 g/dL   RDW 19.9 (H) 11.5 - 15.5 %   Platelets 136 (L) 150 - 400 K/uL  Renal function panel     Status: Abnormal   Collection Time: 04/05/16  5:32 AM  Result Value Ref Range   Sodium 142 135 - 145 mmol/L   Potassium 3.9 3.5 - 5.1 mmol/L   Chloride 114 (H) 101 - 111 mmol/L   CO2 23 22 - 32 mmol/L   Glucose, Bld 102 (H) 65 - 99 mg/dL   BUN 23 (H) 6 - 20 mg/dL   Creatinine, Ser  2.90 (H) 0.61 - 1.24 mg/dL   Calcium 8.3 (L) 8.9 - 10.3 mg/dL   Phosphorus 3.0 2.5 - 4.6 mg/dL   Albumin 2.6 (L) 3.5 - 5.0 g/dL   GFR calc non Af Amer 21 (L) >60 mL/min   GFR calc Af Amer 24 (L) >60 mL/min    Comment: (NOTE) The eGFR has been calculated using the CKD EPI equation. This calculation has not been validated in all clinical situations. eGFR's persistently <60  mL/min signify possible Chronic Kidney Disease.    Anion gap 5 5 - 15    ABGS No results for input(s): PHART, PO2ART, TCO2, HCO3 in the last 72 hours.  Invalid input(s): PCO2 CULTURES Recent Results (from the past 240 hour(s))  Urine culture     Status: Abnormal   Collection Time: 04/03/16 10:15 AM  Result Value Ref Range Status   Specimen Description URINE, CATHETERIZED  Final   Special Requests NONE  Final   Culture >=100,000 COLONIES/mL ESCHERICHIA COLI (A)  Final   Report Status 04/05/2016 FINAL  Final   Organism ID, Bacteria ESCHERICHIA COLI (A)  Final      Susceptibility   Escherichia coli - MIC*    AMPICILLIN 8 SENSITIVE Sensitive     CEFAZOLIN <=4 SENSITIVE Sensitive     CEFTRIAXONE <=1 SENSITIVE Sensitive     CIPROFLOXACIN <=0.25 SENSITIVE Sensitive     GENTAMICIN <=1 SENSITIVE Sensitive     IMIPENEM <=0.25 SENSITIVE Sensitive     NITROFURANTOIN <=16 SENSITIVE Sensitive     TRIMETH/SULFA <=20 SENSITIVE Sensitive     AMPICILLIN/SULBACTAM 4 SENSITIVE Sensitive     PIP/TAZO <=4 SENSITIVE Sensitive     * >=100,000 COLONIES/mL ESCHERICHIA COLI   Studies/Results: US Renal  04/03/2016  CLINICAL DATA:  Pain from catheter.  Renal insufficiency. EXAM: RENAL / URINARY TRACT ULTRASOUND COMPLETE COMPARISON:  CT abdomen dated 03/10/2016. Also abdomen ultrasound dated 05/24/2015. FINDINGS: Right Kidney: Length: 11.4 cm. Increased cortical echogenicity, similar to the previous abdomen ultrasound. Stable mild hydronephrosis. Left Kidney: Length: 10 cm. Increased renal cortex echogenicity. Benign-appearing cyst  within the midpole region of the left kidney, measuring approximately 2.8 cm greatest dimension, probably stable given differences in measurement technique. No hydronephrosis. Bladder: Bladder decompressed by Foley catheter. Bladder wall thickness measured at 1.3 cm, probably accentuated to some degree by the bladder decompression. IMPRESSION: 1. Bladder decompressed by Foley catheter. Bladder wall appears thickened, measured at 1.3 cm thickness, probably accentuated to some degree by the bladder decompression. Recommend correlation with urinalysis to exclude associated cystitis. 2. Stable mild right-sided hydronephrosis. 3. Echogenic renal cortices bilaterally suggesting chronic medical renal disease. These results will be called to the ordering clinician or representative by the Radiologist Assistant, and communication documented in the PACS or zVision Dashboard. Electronically Signed   By: Franki Cabot M.D.   On: 04/03/2016 13:37   US Venous Img Lower Unilateral Right  04/03/2016  CLINICAL DATA:  Right lower extremity edema a EXAM: RIGHT LOWER EXTREMITY VENOUS DOPPLER ULTRASOUND TECHNIQUE: Gray-scale sonography with compression, as well as color and duplex ultrasound, were performed to evaluate the deep venous system from the level of the common femoral vein through the popliteal and proximal calf veins. COMPARISON:  None FINDINGS: Normal compressibility of the common femoral, superficial femoral, and popliteal veins, as well as the proximal calf veins. No filling defects to suggest DVT on grayscale or color Doppler imaging. Doppler waveforms show normal direction of venous flow, normal respiratory phasicity and response to augmentation. Moderate right calf subcutaneous edema limits evaluation of calf veins. Survey views of the contralateral common femoral vein are unremarkable. IMPRESSION: No evidence of right lower extremity deep vein thrombosis. Electronically Signed   By: Lucrezia Europe M.D.   On: 04/03/2016  10:55    Medications: I have reviewed the patient's current medications.  Assesment:   Active Problems:   DM (diabetes mellitus), type 2, uncontrolled ([place])   Acute pyelonephritis   ARF (acute renal failure) (H[facility])   Acute  renal failure superimposed on stage 4 chronic kidney disease ([place])   Essential hypertension   UTI (lower urinary tract infection)   AKI (acute kidney injury) ([place]) Anemia    Plan:  Mediations reviewed Will continue IV antibiotics Nephrology consult appreciated  Continue iv lasix.    LOS: 1 day   Zachary [name] 04/05/2016, 8:51 AM

## 2016-04-06 MED ORDER — SODIUM CHLORIDE 0.9 % IV SOLN
510.0000 mg | INTRAVENOUS | Status: DC
  Administered 2016-04-06: 510 mg via INTRAVENOUS
  Filled 2016-04-06: qty 17

## 2016-04-06 MED ORDER — TORSEMIDE 20 MG PO TABS
50.0000 mg | ORAL_TABLET | Freq: Every day | ORAL | Status: DC
  Administered 2016-04-06 – 2016-04-07 (×2): 50 mg via ORAL
  Filled 2016-04-06 (×2): qty 3

## 2016-04-06 NOTE — Progress Notes (Signed)
Subjective: Patient feels better. His renal function is stabilizing. He is growing E.coli in urine.  Objective: Vital signs in last 24 hours: Temp:  [98.3 F (36.8 C)-99.4 F (37.4 C)] 98.4 F (36.9 C) (06/11 0548) Pulse Rate:  [92-98] 92 (06/11 0548) Resp:  [20] 20 (06/11 0548) BP: (97-132)/(57-65) 97/62 mmHg (06/11 0548) SpO2:  [96 %-99 %] 99 % (06/11 0548) Weight change:     Intake/Output from previous day: 06/10 0701 - 06/11 0700 In: 420 [P.O.:420] Out: 1950 [Urine:1950]  PHYSICAL EXAM General appearance: no distress and slowed mentation Resp: diminished breath sounds bilaterally and rhonchi bilaterally Cardio: S1, S2 normal GI: soft, non-tender; bowel sounds normal; no masses,  no organomegaly Extremities: 3+++ leg edema  Lab Results:  Results for orders placed or performed during the hospital encounter of 04/03/16 (from the past 48 hour(s))  Sodium, urine, random     Status: None   Collection Time: 04/04/16  6:00 PM  Result Value Ref Range   Sodium, Ur 116 mmol/L  Osmolality, urine     Status: None   Collection Time: 04/04/16  6:00 PM  Result Value Ref Range   Osmolality, Ur 424 300 - 900 mOsm/kg    Comment: Performed at [facility]  CBC     Status: Abnormal   Collection Time: 04/05/16  2:30 AM  Result Value Ref Range   WBC 6.7 4.0 - 10.5 K/uL   RBC 2.73 (L) 4.22 - 5.81 MIL/uL   Hemoglobin 8.9 (L) 13.0 - 17.0 g/dL    Comment: POST TRANSFUSION SPECIMEN   HCT 26.1 (L) 39.0 - 52.0 %   MCV 95.6 78.0 - 100.0 fL    Comment: DELTA CHECK NOTED   MCH 32.6 26.0 - 34.0 pg   MCHC 34.1 30.0 - 36.0 g/dL   RDW 19.8 (H) 11.5 - 15.5 %   Platelets 130 (L) 150 - 400 K/uL  CBC     Status: Abnormal   Collection Time: 04/05/16  5:32 AM  Result Value Ref Range   WBC 7.2 4.0 - 10.5 K/uL   RBC 2.82 (L) 4.22 - 5.81 MIL/uL   Hemoglobin 9.0 (L) 13.0 - 17.0 g/dL   HCT 27.1 (L) 39.0 - 52.0 %   MCV 96.1 78.0 - 100.0 fL   MCH 31.9 26.0 - 34.0 pg   MCHC 33.2 30.0 - 36.0  g/dL   RDW 19.9 (H) 11.5 - 15.5 %   Platelets 136 (L) 150 - 400 K/uL  Renal function panel     Status: Abnormal   Collection Time: 04/05/16  5:32 AM  Result Value Ref Range   Sodium 142 135 - 145 mmol/L   Potassium 3.9 3.5 - 5.1 mmol/L   Chloride 114 (H) 101 - 111 mmol/L   CO2 23 22 - 32 mmol/L   Glucose, Bld 102 (H) 65 - 99 mg/dL   BUN 23 (H) 6 - 20 mg/dL   Creatinine, Ser 2.90 (H) 0.61 - 1.24 mg/dL   Calcium 8.3 (L) 8.9 - 10.3 mg/dL   Phosphorus 3.0 2.5 - 4.6 mg/dL   Albumin 2.6 (L) 3.5 - 5.0 g/dL   GFR calc non Af Amer 21 (L) >60 mL/min   GFR calc Af Amer 24 (L) >60 mL/min    Comment: (NOTE) The eGFR has been calculated using the CKD EPI equation. This calculation has not been validated in all clinical situations. eGFR's persistently <60 mL/min signify possible Chronic Kidney Disease.    Anion gap 5 5 - 15  Iron and TIBC     Status: Abnormal   Collection Time: 04/05/16  5:32 AM  Result Value Ref Range   Iron 21 (L) 45 - 182 ug/dL   TIBC 175 (L) 250 - 450 ug/dL   Saturation Ratios 12 (L) 17.9 - 39.5 %   UIBC 154 ug/dL    Comment: Performed at [facility]  Ferritin     Status: None   Collection Time: 04/05/16  5:32 AM  Result Value Ref Range   Ferritin 37 24 - 336 ng/mL    Comment: Performed at [facility]    ABGS No results for input(s): PHART, PO2ART, TCO2, HCO3 in the last 72 hours.  Invalid input(s): PCO2 CULTURES Recent Results (from the past 240 hour(s))  Urine culture     Status: Abnormal   Collection Time: 04/03/16 10:15 AM  Result Value Ref Range Status   Specimen Description URINE, CATHETERIZED  Final   Special Requests NONE  Final   Culture >=100,000 COLONIES/mL ESCHERICHIA COLI (A)  Final   Report Status 04/05/2016 FINAL  Final   Organism ID, Bacteria ESCHERICHIA COLI (A)  Final      Susceptibility   Escherichia coli - MIC*    AMPICILLIN 8 SENSITIVE Sensitive     CEFAZOLIN <=4 SENSITIVE Sensitive     CEFTRIAXONE <=1 SENSITIVE  Sensitive     CIPROFLOXACIN <=0.25 SENSITIVE Sensitive     GENTAMICIN <=1 SENSITIVE Sensitive     IMIPENEM <=0.25 SENSITIVE Sensitive     NITROFURANTOIN <=16 SENSITIVE Sensitive     TRIMETH/SULFA <=20 SENSITIVE Sensitive     AMPICILLIN/SULBACTAM 4 SENSITIVE Sensitive     PIP/TAZO <=4 SENSITIVE Sensitive     * >=100,000 COLONIES/mL ESCHERICHIA COLI   Studies/Results: No results found.  Medications: I have reviewed the patient's current medications.  Assesment:   Active Problems:   DM (diabetes mellitus), type 2, uncontrolled ([place])   Acute pyelonephritis   ARF (acute renal failure) (HCC)   Acute renal failure superimposed on stage 4 chronic kidney disease (HCC)   Essential hypertension   UTI (lower urinary tract infection)   AKI (acute kidney injury) ([place]) Anemia    Plan:  Mediations reviewed Will continue IV antibiotics Nephrology consult appreciated  Continue iv lasix.    LOS: 2 days   Ziona Wickens 04/06/2016, 8:32 AM

## 2016-04-06 NOTE — Progress Notes (Signed)
Subjective: Interval History: Patient offer no complaint. He denies any difficulty breathing..  Objective: Vital signs in last 24 hours: Temp:  [98.3 F (36.8 C)-99.4 F (37.4 C)] 98.4 F (36.9 C) (06/11 0548) Pulse Rate:  [92-98] 92 (06/11 0548) Resp:  [20] 20 (06/11 0548) BP: (97-132)/(57-65) 97/62 mmHg (06/11 0548) SpO2:  [96 %-99 %] 99 % (06/11 0548) Weight change:   Intake/Output from previous day: 06/10 0701 - 06/11 0700 In: 420 [P.O.:420] Out: 1950 [Urine:1950] Intake/Output this shift:    Generally: He is alert and in no apparent distress Chest is clear  Heart RRR no murmur Extremities: Trace edma  Lab Results:  Recent Labs  04/05/16 0230 04/05/16 0532  WBC 6.7 7.2  HGB 8.9* 9.0*  HCT 26.1* 27.1*  PLT 130* 136*   BMET:   Recent Labs  04/04/16 0508 04/05/16 0532  NA 142 142  K 3.7 3.9  CL 116* 114*  CO2 22 23  GLUCOSE 107* 102*  BUN 25* 23*  CREATININE 2.94* 2.90*  CALCIUM 8.2* 8.3*   No results for input(s): PTH in the last 72 hours. Iron Studies:   Recent Labs  04/05/16 0532  IRON 21*  TIBC 175*  FERRITIN 37    Studies/Results: No results found.  I have reviewed the patient's current medications.  Assessment/Plan: Problem #1 acute kidney injury superimposed on chronic. Presently his renal function is stable and his base line. Patient is nonoliguric and about 1900 mL of urine output the last 24 hours. Problem #2 chronic renal failure: Stage IV. A combination of obstructive uropathy/diabetes/recurrent UTI. Problem #3 anemia: His hemoglobin is stable. His iron saturation and ferritin is low . Iron deficiency anemia  Problem #4 metabolic bone disease: His calcium and phosphorus is range Problem #5 history of diabetes Problem #6 hypertension Problem #7 urinary tract infection. Presently patient is afebrile and his white blood cell count is normal. Urine Culture grew  Ecoli Plan: 1]Ferriheme 510 mg iv today and another dose in 1 week        2]D/C ivf          3]D/C lasix          4}  Demadex 20 mg po once a day          5]We will check his renal panel in the morning.     LOS: 2 days   Elsworth Ledin S 04/06/2016,8:51 AM

## 2016-04-07 MED ORDER — LEVOFLOXACIN 500 MG PO TABS: 500.0000 mg | ORAL_TABLET | Freq: Every day | ORAL | Status: DC

## 2016-04-07 MED ORDER — TORSEMIDE 10 MG PO TABS: 50.0000 mg | ORAL_TABLET | Freq: Every day | ORAL | Status: DC

## 2016-04-07 NOTE — Discharge Summary (Signed)
Physician Discharge Summary  Patient ID: Zachary [name] MRN: [id number] DOB/AGE: [birth year] 68 y.o. Primary Care Physician:Sherene Plancarte, MD Admit date: 04/03/2016 Discharge date: 04/07/2016    Discharge Diagnoses:   Active Problems:   DM (diabetes mellitus), type 2, uncontrolled ([place])   Acute pyelonephritis   ARF (acute renal failure) (HCC)   Acute renal failure superimposed on stage 4 chronic kidney disease (HCC)   Essential hypertension   UTI (lower urinary tract infection)   AKI (acute kidney injury) ([place])     Medication List    TAKE these medications        carvedilol 6.25 MG tablet  Commonly known as:  COREG  Take 1 tablet (6.25 mg total) by mouth 2 (two) times daily with a meal.     ferrous sulfate 325 (65 FE) MG tablet  Take 325 mg by mouth daily with breakfast.     isosorbide-hydrALAZINE 20-37.5 MG tablet  Commonly known as:  BIDIL  Take 1 tablet by mouth 3 (three) times daily.     levofloxacin 500 MG tablet  Commonly known as:  LEVAQUIN  Take 1 tablet (500 mg total) by mouth daily.     levothyroxine 75 MCG tablet  Commonly known as:  SYNTHROID, LEVOTHROID  Take 75 mcg by mouth daily before breakfast.     linagliptin 5 MG Tabs tablet  Commonly known as:  TRADJENTA  Take 5 mg by mouth daily.     niacin 1000 MG CR tablet  Commonly known as:  NIASPAN  Take 1,000 mg by mouth at bedtime.     omeprazole 20 MG capsule  Commonly known as:  PRILOSEC  Take 40 mg by mouth daily.     ondansetron 4 mg Tabs tablet  Commonly known as:  ZOFRAN  Take 1 tablet by mouth every 8 hours when necessary nausea.     oxyCODONE-acetaminophen 5-325 MG tablet  Commonly known as:  ROXICET  Take 1 tablet by mouth every 6 (six) hours as needed.     simvastatin 20 MG tablet  Commonly known as:  ZOCOR  Take 20 mg by mouth at bedtime.     sodium polystyrene 15 GM/60ML suspension  Commonly known as:  KAYEXALATE  Take 30 g by mouth every Monday, Wednesday, and Friday. Drinks  entire content of 1 bottle (30 gm) once daily on Monday/Wednesday/Friday     tamsulosin 0.4 MG Caps capsule  Commonly known as:  FLOMAX  Take 0.4 mg by mouth daily.     torsemide 10 MG tablet  Commonly known as:  DEMADEX  Take 5 tablets (50 mg total) by mouth daily.     Vitamin D (Ergocalciferol) 50000 units Caps capsule  Commonly known as:  DRISDOL  Take 1 capsule (50,000 Units total) by mouth every 7 (seven) days.        Discharged Condition: improved    Consults: nephrology  Significant Diagnostic Studies: Ct Abdomen Pelvis Wo Contrast  03/10/2016  CLINICAL DATA:  Right upper quadrant and left upper quadrant abdominal pain with nausea for 1 week. EXAM: CT ABDOMEN AND PELVIS WITHOUT CONTRAST TECHNIQUE: Multidetector CT imaging of the abdomen and pelvis was performed following the standard protocol without IV contrast. COMPARISON:  09/13/2015 FINDINGS: Mild dependent changes in the lung bases. Coronary artery calcifications. Small pericardial effusion. There is bilateral hydronephrosis and hydroureter with stranding around the kidneys. No renal, ureteral, or bladder stones are identified. Bladder wall is diffusely thickened. Prostate gland is enlarged at 5.2 x 5.5 cm in diameter. Changes  could be due to cystitis and pyelonephritis or bladder outlet obstruction with bladder wall hypertrophy and reflux. The unenhanced appearance of the liver, spleen, gallbladder, pancreas, adrenal glands, abdominal aorta, inferior vena cava, and retroperitoneal lymph nodes is unremarkable. Stomach, small bowel, and colon are not abnormally distended. Stool fills the colon. No free air or free fluid in the abdomen. Pelvis: Appendix is not identified. No free or loculated pelvic fluid collections. No pelvic lymphadenopathy. Degenerative changes in the spine. No destructive bone lesions. IMPRESSION: Bilateral hydronephrosis and hydroureter. Bladder wall thickening. Prostate gland enlarged. No stones identified.  Changes could be due to cystitis and pyelonephritis or bladder outlet obstruction with bladder wall hypertrophy and reflux. Electronically Signed   By: Lucienne Capers M.D.   On: 03/10/2016 02:50   US Renal  04/03/2016  CLINICAL DATA:  Pain from catheter.  Renal insufficiency. EXAM: RENAL / URINARY TRACT ULTRASOUND COMPLETE COMPARISON:  CT abdomen dated 03/10/2016. Also abdomen ultrasound dated 05/24/2015. FINDINGS: Right Kidney: Length: 11.4 cm. Increased cortical echogenicity, similar to the previous abdomen ultrasound. Stable mild hydronephrosis. Left Kidney: Length: 10 cm. Increased renal cortex echogenicity. Benign-appearing cyst within the midpole region of the left kidney, measuring approximately 2.8 cm greatest dimension, probably stable given differences in measurement technique. No hydronephrosis. Bladder: Bladder decompressed by Foley catheter. Bladder wall thickness measured at 1.3 cm, probably accentuated to some degree by the bladder decompression. IMPRESSION: 1. Bladder decompressed by Foley catheter. Bladder wall appears thickened, measured at 1.3 cm thickness, probably accentuated to some degree by the bladder decompression. Recommend correlation with urinalysis to exclude associated cystitis. 2. Stable mild right-sided hydronephrosis. 3. Echogenic renal cortices bilaterally suggesting chronic medical renal disease. These results will be called to the ordering clinician or representative by the Radiologist Assistant, and communication documented in the PACS or zVision Dashboard. Electronically Signed   By: Franki Cabot M.D.   On: 04/03/2016 13:37   US Venous Img Lower Unilateral Right  04/03/2016  CLINICAL DATA:  Right lower extremity edema a EXAM: RIGHT LOWER EXTREMITY VENOUS DOPPLER ULTRASOUND TECHNIQUE: Gray-scale sonography with compression, as well as color and duplex ultrasound, were performed to evaluate the deep venous system from the level of the common femoral vein through the  popliteal and proximal calf veins. COMPARISON:  None FINDINGS: Normal compressibility of the common femoral, superficial femoral, and popliteal veins, as well as the proximal calf veins. No filling defects to suggest DVT on grayscale or color Doppler imaging. Doppler waveforms show normal direction of venous flow, normal respiratory phasicity and response to augmentation. Moderate right calf subcutaneous edema limits evaluation of calf veins. Survey views of the contralateral common femoral vein are unremarkable. IMPRESSION: No evidence of right lower extremity deep vein thrombosis. Electronically Signed   By: Lucrezia Europe M.D.   On: 04/03/2016 10:55    Lab Results: Basic Metabolic Panel:  Recent Labs  04/05/16 0532  NA 142  K 3.9  CL 114*  CO2 23  GLUCOSE 102*  BUN 23*  CREATININE 2.90*  CALCIUM 8.3*  PHOS 3.0   Liver Function Tests:  Recent Labs  04/05/16 0532  ALBUMIN 2.6*     CBC:  Recent Labs  04/05/16 0230 04/05/16 0532  WBC 6.7 7.2  HGB 8.9* 9.0*  HCT 26.1* 27.1*  MCV 95.6 96.1  PLT 130* 136*    Recent Results (from the past 240 hour(s))  Urine culture     Status: Abnormal   Collection Time: 04/03/16 10:15 AM  Result Value Ref Range  Status   Specimen Description URINE, CATHETERIZED  Final   Special Requests NONE  Final   Culture >=100,000 COLONIES/mL ESCHERICHIA COLI (A)  Final   Report Status 04/05/2016 FINAL  Final   Organism ID, Bacteria ESCHERICHIA COLI (A)  Final      Susceptibility   Escherichia coli - MIC*    AMPICILLIN 8 SENSITIVE Sensitive     CEFAZOLIN <=4 SENSITIVE Sensitive     CEFTRIAXONE <=1 SENSITIVE Sensitive     CIPROFLOXACIN <=0.25 SENSITIVE Sensitive     GENTAMICIN <=1 SENSITIVE Sensitive     IMIPENEM <=0.25 SENSITIVE Sensitive     NITROFURANTOIN <=16 SENSITIVE Sensitive     TRIMETH/SULFA <=20 SENSITIVE Sensitive     AMPICILLIN/SULBACTAM 4 SENSITIVE Sensitive     PIP/TAZO <=4 SENSITIVE Sensitive     * >=100,000 COLONIES/mL  ESCHERICHIA COLI     Hospital Course:   This is a 70 years old male with history of multiple medical illnesses was admitted due UTI and worsening renal ffailurewith leg edema. Patient was seen by nephrology. His medications were adjusted. He was started on iv antibotics. Patient grew E.coli. HE IMPROVED AND IS BEING DISCHARGED ON ORAL ANTIBIOTICS.  Discharge Exam: Blood pressure 98/60, pulse 92, temperature 98.5 F (36.9 C), temperature source Oral, resp. rate 20, height 5\' 7"  (1.702 m), weight 79.379 kg (175 lb), SpO2 99 %.   Disposition:          Follow-up Information    Follow up with [facility].   Contact information:   [address] [place] [address] [phone]       Follow up with [facility], MD In 1 week.   Specialty:  Internal Medicine   Contact information:   [place] [address] [phone]       Signed: [name]   04/07/2016, 8:44 AM

## 2016-04-07 NOTE — Care Management Note (Signed)
Case Management Note  Patient Details  Name: Zachary [name] MRN: [id number] Date of Birth: 06/29/1946   Expected Discharge Date:    04/07/2016            Expected Discharge Plan:  Assisted Living / Rest Home  In-House Referral:  Clinical Social Work  Discharge planning Services  CM Consult  Post Acute Care Choice:  Home Health, Resumption of Svcs/PTA Provider Choice offered to:  Patient  DME Arranged:    DME Agency:     HH Arranged:  RN [employer] Agency:  [place]  Status of Service:  Completed, signed off  Medicare Important Message Given:  Yes Date Medicare IM Given:    Medicare IM give by:    Date Additional Medicare IM Given:    Additional Medicare Important Message give by:     If discussed at [place] of Stay Meetings, dates discussed:    Additional Comments: Pt discharged to [facility]. CSW has made arrangements for return to facility. [place] services through [facility] were resumes, Romualdo Bolk, of [facility], made aware of resumption and will obtain pt info from chart. Pt/facility aware HH has 48 hours to make next visit.  Sherald Barge, RN 04/07/2016, 3:30 PM

## 2016-04-07 NOTE — Care Management Important Message (Signed)
Important Message  Patient Details  Name: Zachary [name] MRN: [id number] Date of Birth: 05/20/1946   Medicare Important Message Given:  Yes    Sherald Barge, RN 04/07/2016, 9:25 AM

## 2016-04-07 NOTE — Clinical Social Work Note (Signed)
CSW notified Tammy at [facility] that patient was being discharged today. CW spoke with [name] who advised that the facility's transportation would pick patient up.   CSW notified [name], RCDSS guardian, and advised that patient was being discharged and would be transported back to the facility by facility staff.   CSW signing off.   Aalijah Lanphere, Clydene Pugh, LCSW

## 2016-04-07 NOTE — NC FL2 (Signed)
[place] LEVEL OF CARE SCREENING TOOL     IDENTIFICATION  Patient Name: Zachary [name] Birthdate: 07-22-1946 Sex: male Admission Date (Current Location): 04/03/2016  [place] and [place] Number:  [place] [id number] [place] and Address:  [place] [address], [place]      Provider Number: [id number]  Attending Physician Name and Address:  [name], MD  Relative Name and Phone Number:  [name] - Legal Guardian [phone]), RCDSS    Current Level of Care: Hospital Recommended Level of Care: [place] Prior Approval Number:    Date Approved/Denied:   PASRR Number: [id number]  Discharge Plan: Other (Comment) ([place], ALF)    Current Diagnoses: Patient Active Problem List   Diagnosis Date Noted  . AKI (acute kidney injury) ([place]) 04/03/2016  . Uremia 03/10/2016  . Hydronephrosis, bilateral 03/10/2016  . Acute on chronic renal failure ([place]) 03/10/2016  . End stage renal disease ([place]) 10/08/2015  . 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 Left arm pain 09/27/2015  . Cold sensation of skin-Left Hand 09/27/2015  . Left arm swelling 09/27/2015  . Pyelonephritis 09/13/2015  . UTI (lower urinary tract infection) 07/20/2015  . Heme positive stool   . Absolute anemia   . Abnormal CT scan, esophagus   . Protein-calorie malnutrition, severe ([place]) 07/10/2015  . Acute renal failure superimposed on stage 4 chronic kidney disease ([place]) 07/08/2015  . Nausea and vomiting 07/08/2015  . Essential hypertension 07/08/2015  . Hypernatremia 07/08/2015  . Hyperlipidemia 07/08/2015  . Nausea & vomiting 07/08/2015  . Hyperkalemia 05/22/2015  . Obstructive uropathy 04/17/2015  . ARF (acute renal failure) ([place]) 04/17/2015  . Bladder neck contracture   . Urinary retention   . Anemia, chronic disease 04/16/2015  . BPH (benign prostatic hypertrophy) with urinary retention 03/03/2015  . BPH (benign  prostatic hypertrophy) with urinary obstruction 01/04/2014  . BPH (benign prostatic hypertrophy) 12/29/2013  . Acute renal failure ([place]) 12/25/2013  . Renal insufficiency 12/25/2013  . Dehydration 12/25/2013  . Mental retardation 10/10/2011  . Acute pyelonephritis 10/07/2011  . 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 Bladder wall thickening 10/06/2011  . DM (diabetes mellitus), type 2, uncontrolled ([place]) 10/02/2011  . Iron deficiency anemia 10/02/2011  . Bacteremia 10/02/2011  . FX CLOSED FIBULA NOS 02/10/2008    Orientation RESPIRATION BLADDER Height & Weight     Self, Situation, Place  Normal Continent Weight: 175 lb (79.379 kg) Height:  5\' 7"  (170.2 cm)  BEHAVIORAL SYMPTOMS/MOOD NEUROLOGICAL BOWEL NUTRITION STATUS      Continent  (Renal/carb modified wiht fluid restriction. 544mL of fluid water restriction per 24 hours. )  AMBULATORY STATUS COMMUNICATION OF NEEDS Skin   Limited Assist Verbally Normal                       Personal Care Assistance Level of Assistance  Bathing, Dressing Bathing Assistance: Limited assistance Feeding assistance: Independent Dressing Assistance: Limited assistance     Functional Limitations Info  Hearing, Sight, Speech Sight Info: Adequate Hearing Info: Adequate Speech Info: Impaired    SPECIAL CARE FACTORS FREQUENCY                       Contractures      Additional Factors Info    Code Status Info:  (Full code)             Current Medications (04/07/2016):  This is the current hospital active medication list  Current Facility-Administered Medications  Medication Dose Route Frequency Provider Last Rate Last Dose  . carvedilol (COREG) tablet 6.25 mg  6.25 mg Oral BID WC Orvan Falconer, MD   6.25 mg at 04/07/16 0844  . cefTRIAXone (ROCEPHIN) 1 g in dextrose 5 % 50 mL IVPB  1 g Intravenous Q24H Orvan Falconer, MD   1 g at 04/06/16 1658  . ferumoxytol (FERAHEME) 510 mg in sodium chloride 0.9 % 100 mL IVPB  510 mg Intravenous Weekly  Fran Lowes, MD   510 mg at 04/06/16 1255  . heparin injection 5,000 Units  5,000 Units Subcutaneous Q8H Orvan Falconer, MD   5,000 Units at 04/07/16 0616  . isosorbide-hydrALAZINE (BIDIL) 20-37.5 MG per tablet 1 tablet  1 tablet Oral TID Orvan Falconer, MD   1 tablet at 04/07/16 0845  . levothyroxine (SYNTHROID, LEVOTHROID) tablet 75 mcg  75 mcg Oral QAC breakfast Orvan Falconer, MD   75 mcg at 04/07/16 0844  . linagliptin (TRADJENTA) tablet 5 mg  5 mg Oral Daily Orvan Falconer, MD   5 mg at 04/07/16 0845  . niacin (NIASPAN) CR tablet 1,000 mg  1,000 mg Oral QHS Orvan Falconer, MD   1,000 mg at 04/06/16 2239  . oxyCODONE-acetaminophen (PERCOCET/ROXICET) 5-325 MG per tablet 1 tablet  1 tablet Oral Q6H PRN Orvan Falconer, MD      . pantoprazole (PROTONIX) EC tablet 40 mg  40 mg Oral Daily Orvan Falconer, MD   40 mg at 04/07/16 0845  . simvastatin (ZOCOR) tablet 20 mg  20 mg Oral QHS Orvan Falconer, MD   20 mg at 04/06/16 2240  . tamsulosin (FLOMAX) capsule 0.4 mg  0.4 mg Oral Daily Orvan Falconer, MD   0.4 mg at 04/07/16 0845  . torsemide (DEMADEX) tablet 50 mg  50 mg Oral Daily Fran Lowes, MD   50 mg at 04/07/16 0843  . Vitamin D (Ergocalciferol) (DRISDOL) capsule 50,000 Units  50,000 Units Oral Q Legrand Pitts, MD   50,000 Units at 04/07/16 0844     Discharge Medications: carvedilol 6.25 MG tablet  Commonly known as: COREG  Take 1 tablet (6.25 mg total) by mouth 2 (two) times daily with a meal.      ferrous sulfate 325 (65 FE) MG tablet  Take 325 mg by mouth daily with breakfast.     isosorbide-hydrALAZINE 20-37.5 MG tablet  Commonly known as: BIDIL  Take 1 tablet by mouth 3 (three) times daily.     levofloxacin 500 MG tablet  Commonly known as: LEVAQUIN  Take 1 tablet (500 mg total) by mouth daily.     levothyroxine 75 MCG tablet  Commonly known as: SYNTHROID, LEVOTHROID  Take 75 mcg by mouth daily before breakfast.     linagliptin 5 MG Tabs tablet  Commonly known as: TRADJENTA  Take 5 mg  by mouth daily.     niacin 1000 MG CR tablet  Commonly known as: NIASPAN  Take 1,000 mg by mouth at bedtime.     omeprazole 20 MG capsule  Commonly known as: PRILOSEC  Take 40 mg by mouth daily.     ondansetron 4 mg Tabs tablet  Commonly known as: ZOFRAN  Take 1 tablet by mouth every 8 hours when necessary nausea.     oxyCODONE-acetaminophen 5-325 MG tablet  Commonly known as: ROXICET  Take 1 tablet by mouth every 6 (six) hours as needed.     simvastatin 20 MG tablet  Commonly known as: ZOCOR  Take 20 mg by  mouth at bedtime.     sodium polystyrene 15 GM/60ML suspension  Commonly known as: KAYEXALATE  Take 30 g by mouth every Monday, Wednesday, and Friday. Drinks entire content of 1 bottle (30 gm) once daily on Monday/Wednesday/Friday     tamsulosin 0.4 MG Caps capsule  Commonly known as: FLOMAX  Take 0.4 mg by mouth daily.     torsemide 10 MG tablet  Commonly known as: DEMADEX  Take 5 tablets (50 mg total) by mouth daily.     Vitamin D (Ergocalciferol) 50000 units Caps capsule  Commonly known as: DRISDOL  Take 1 capsule (50,000 Units total) by mouth every 7 (seven) days.           Relevant Imaging Results:  Relevant Lab Results:   Additional Information    Shianna Bally, Clydene Pugh, LCSW

## 2016-04-07 NOTE — Progress Notes (Signed)
Subjective: Interval History: Patient denies any nausea or vomiting. His appetite is good and no difficulty breathing.  Objective: Vital signs in last 24 hours: Temp:  [98.4 F (36.9 C)-98.5 F (36.9 C)] 98.5 F (36.9 C) (06/12 0622) Pulse Rate:  [86-93] 91 (06/12 0843) Resp:  [20] 20 (06/12 0622) BP: (98-141)/(60-67) 122/62 mmHg (06/12 0843) SpO2:  [97 %-99 %] 99 % (06/12 0622) Weight change:   Intake/Output from previous day: 06/11 0701 - 06/12 0700 In: 960 [P.O.:360; I.V.:600] Out: 2600 [Urine:2600] Intake/Output this shift: Total I/O In: 240 [P.O.:240] Out: -   Generally: He is alert and in no apparent distress Chest is clear  Heart RRR no murmur Extremities: Trace edma  Lab Results:  Recent Labs  04/05/16 0230 04/05/16 0532  WBC 6.7 7.2  HGB 8.9* 9.0*  HCT 26.1* 27.1*  PLT 130* 136*   BMET:   Recent Labs  04/05/16 0532  NA 142  K 3.9  CL 114*  CO2 23  GLUCOSE 102*  BUN 23*  CREATININE 2.90*  CALCIUM 8.3*   No results for input(s): PTH in the last 72 hours. Iron Studies:   Recent Labs  04/05/16 0532  IRON 21*  TIBC 175*  FERRITIN 37    Studies/Results: No results found.  I have reviewed the patient's current medications.  Assessment/Plan: Problem #1 acute kidney injury superimposed on chronic. Presently his renal function is stable and his base line. Patient remains non-oliguric. Problem #2 chronic renal failure: Stage IV. A combination of obstructive uropathy/diabetes/recurrent UTI patient presently doesn't have any uremic sinus symptoms.. Problem #3 anemia: His hemoglobin is stable. His iron saturation and ferritin is low . Iron deficiency anemia patient has received a dose of IV iron yesterday. He will receive another dose next week. Problem #4 metabolic bone disease: His calcium and phosphorus is range Problem #5 history of diabetes Problem #6 hypertension: His blood pressure is reasonably controlled Problem #7 urinary tract  infection. Presently patient is afebrile and his white blood cell count is normal. Urine Culture grew  Ecoli Plan: 1] will see patient in 4 weeks as an outpatient. 2] thank you letting me participate  in his care.     LOS: 3 days   Shaye Lagace S 04/07/2016,10:24 AM

## 2016-04-07 NOTE — Progress Notes (Signed)
Zachary [name] discharged [name] per MD order.  Discharge instructions reviewed and discussed with the patient, all questions and concerns answered. Copy of instructions and scripts given to patient.    Medication List    TAKE these medications        carvedilol 6.25 MG tablet  Commonly known as:  COREG  Take 1 tablet (6.25 mg total) by mouth 2 (two) times daily with a meal.     ferrous sulfate 325 (65 FE) MG tablet  Take 325 mg by mouth daily with breakfast.     isosorbide-hydrALAZINE 20-37.5 MG tablet  Commonly known as:  BIDIL  Take 1 tablet by mouth 3 (three) times daily.     levofloxacin 500 MG tablet  Commonly known as:  LEVAQUIN  Take 1 tablet (500 mg total) by mouth daily.     levothyroxine 75 MCG tablet  Commonly known as:  SYNTHROID, LEVOTHROID  Take 75 mcg by mouth daily before breakfast.     linagliptin 5 MG Tabs tablet  Commonly known as:  TRADJENTA  Take 5 mg by mouth daily.     niacin 1000 MG CR tablet  Commonly known as:  NIASPAN  Take 1,000 mg by mouth at bedtime.     omeprazole 20 MG capsule  Commonly known as:  PRILOSEC  Take 40 mg by mouth daily.     ondansetron 4 mg Tabs tablet  Commonly known as:  ZOFRAN  Take 1 tablet by mouth every 8 hours when necessary nausea.     oxyCODONE-acetaminophen 5-325 MG tablet  Commonly known as:  ROXICET  Take 1 tablet by mouth every 6 (six) hours as needed.     simvastatin 20 MG tablet  Commonly known as:  ZOCOR  Take 20 mg by mouth at bedtime.     sodium polystyrene 15 GM/60ML suspension  Commonly known as:  KAYEXALATE  Take 30 g by mouth every Monday, Wednesday, and Friday. Drinks entire content of 1 bottle (30 gm) once daily on Monday/Wednesday/Friday     tamsulosin 0.4 MG Caps capsule  Commonly known as:  FLOMAX  Take 0.4 mg by mouth daily.     torsemide 10 MG tablet  Commonly known as:  DEMADEX  Take 5 tablets (50 mg total) by mouth daily.     Vitamin D (Ergocalciferol) 50000  units Caps capsule  Commonly known as:  DRISDOL  Take 1 capsule (50,000 Units total) by mouth every 7 (seven) days.        Patients skin is clean, dry and intact, no evidence of skin break down. IV site discontinued and catheter remains intact. Site without signs and symptoms of complications. Dressing and pressure applied.  Patient escorted to car by NT in a wheelchair,  no distress noted upon discharge.  Ralene Muskrat [name] 04/07/2016 3:03 PM

## 2016-04-08 DIAGNOSIS — Z466 Encounter for fitting and adjustment of urinary device: Secondary | ICD-10-CM | POA: Diagnosis not present

## 2016-04-08 DIAGNOSIS — E1165 Type 2 diabetes mellitus with hyperglycemia: Secondary | ICD-10-CM | POA: Diagnosis not present

## 2016-04-08 DIAGNOSIS — R339 Retention of urine, unspecified: Secondary | ICD-10-CM | POA: Diagnosis not present

## 2016-04-08 DIAGNOSIS — N184 Chronic kidney disease, stage 4 (severe): Secondary | ICD-10-CM | POA: Diagnosis not present

## 2016-04-08 DIAGNOSIS — N400 Enlarged prostate without lower urinary tract symptoms: Secondary | ICD-10-CM | POA: Diagnosis not present

## 2016-04-08 DIAGNOSIS — I129 Hypertensive chronic kidney disease with stage 1 through stage 4 chronic kidney disease, or unspecified chronic kidney disease: Secondary | ICD-10-CM | POA: Diagnosis not present

## 2016-04-09 ENCOUNTER — Encounter: Payer: Self-pay | Admitting: Vascular Surgery

## 2016-04-09 ENCOUNTER — Encounter: Payer: Medicare Other | Admitting: Vascular Surgery

## 2016-04-09 ENCOUNTER — Ambulatory Visit (INDEPENDENT_AMBULATORY_CARE_PROVIDER_SITE_OTHER): Payer: Medicare Other | Admitting: Vascular Surgery

## 2016-04-09 VITALS — BP 136/74 | HR 86 | Temp 98.1°F | Ht 67.0 in | Wt 186.0 lb

## 2016-04-09 DIAGNOSIS — Z466 Encounter for fitting and adjustment of urinary device: Secondary | ICD-10-CM | POA: Diagnosis not present

## 2016-04-09 DIAGNOSIS — R339 Retention of urine, unspecified: Secondary | ICD-10-CM | POA: Diagnosis not present

## 2016-04-09 DIAGNOSIS — E1165 Type 2 diabetes mellitus with hyperglycemia: Secondary | ICD-10-CM | POA: Diagnosis not present

## 2016-04-09 DIAGNOSIS — I129 Hypertensive chronic kidney disease with stage 1 through stage 4 chronic kidney disease, or unspecified chronic kidney disease: Secondary | ICD-10-CM | POA: Diagnosis not present

## 2016-04-09 DIAGNOSIS — N400 Enlarged prostate without lower urinary tract symptoms: Secondary | ICD-10-CM | POA: Diagnosis not present

## 2016-04-09 DIAGNOSIS — N184 Chronic kidney disease, stage 4 (severe): Secondary | ICD-10-CM | POA: Diagnosis not present

## 2016-04-09 DIAGNOSIS — N186 End stage renal disease: Secondary | ICD-10-CM

## 2016-04-09 NOTE — Progress Notes (Signed)
Patient name: Zachary [name] MRN: [id number] DOB: 06-14-1946 Sex: male  REASON FOR VISIT: Follow up after right basilic vein transposition  HPI: Zachary [name] is a 70 y.o. male who had a right basilic vein transposition performed on 02/26/16. He comes in for a 6 week follow up visit. He has no specific complaints. He is not on dialysis.  Current Outpatient Prescriptions  Medication Sig Dispense Refill  . carvedilol (COREG) 6.25 MG tablet Take 1 tablet (6.25 mg total) by mouth 2 (two) times daily with a meal. 60 tablet 3  . ferrous sulfate 325 (65 FE) MG tablet Take 325 mg by mouth daily with breakfast.    . isosorbide-hydrALAZINE (BIDIL) 20-37.5 MG per tablet Take 1 tablet by mouth 3 (three) times daily. 90 tablet 3  . levofloxacin (LEVAQUIN) 500 MG tablet Take 1 tablet (500 mg total) by mouth daily. 5 tablet 0  . levothyroxine (SYNTHROID, LEVOTHROID) 75 MCG tablet Take 75 mcg by mouth daily before breakfast.     . linagliptin (TRADJENTA) 5 MG TABS tablet Take 5 mg by mouth daily.    . niacin (NIASPAN) 1000 MG CR tablet Take 1,000 mg by mouth at bedtime.    Marland Kitchen omeprazole (PRILOSEC) 20 MG capsule Take 40 mg by mouth daily.    . ondansetron (ZOFRAN) 4 mg TABS tablet Take 1 tablet by mouth every 8 hours when necessary nausea. 20 tablet 0  . oxyCODONE-acetaminophen (ROXICET) 5-325 MG tablet Take 1 tablet by mouth every 6 (six) hours as needed. (Patient taking differently: Take 1 tablet by mouth every 6 (six) hours as needed for moderate pain. ) 20 tablet 0  . simvastatin (ZOCOR) 20 MG tablet Take 20 mg by mouth at bedtime.     . tamsulosin (FLOMAX) 0.4 MG CAPS capsule Take 0.4 mg by mouth daily.    Marland Kitchen torsemide (DEMADEX) 10 MG tablet Take 5 tablets (50 mg total) by mouth daily. 30 tablet 3  . Vitamin D, Ergocalciferol, (DRISDOL) 50000 UNITS CAPS capsule Take 1 capsule (50,000 Units total) by mouth every 7 (seven) days. (Patient taking differently: Take 50,000 Units by mouth every Monday. ) 30 capsule  3  . sodium polystyrene (KAYEXALATE) 15 GM/60ML suspension Take 30 g by mouth every Monday, Wednesday, and Friday. Reported on 04/09/2016     No current facility-administered medications for this visit.    REVIEW OF SYSTEMS:  [X]  denotes positive finding, [ ]  denotes negative finding Cardiac  Comments:  Chest pain or chest pressure:    Shortness of breath upon exertion:    Short of breath when lying flat:    Irregular heart rhythm:    Constitutional    Fever or chills:      PHYSICAL EXAM: Filed Vitals:   04/09/16 1130  BP: 136/74  Pulse: 86  Temp: 98.1 F (36.7 C)  TempSrc: Oral  Height: 5\' 7"  (1.702 m)  Weight: 186 lb (84.369 kg)  SpO2: 99%    GENERAL: The patient is a well-nourished male, in no acute distress. The vital signs are documented above. CARDIOVASCULAR: There is a regular rate and rhythm. PULMONARY: There is good air exchange bilaterally without wheezing or rales. His right upper arm fistula has an excellent thrill. His incisions are healing nicely. He has a palpable right radial pulse.  DUPLEX OF RIGHT AV FISTULA: Duplex of the right basilic vein transposition on 03/18/2016 showed the diameters range from 0.66-0.82 cm.  MEDICAL ISSUES:  STATUS POST RIGHT BASILIC VEIN TRANSPOSITION: The patient is  doing well status post right basilic vein transposition. This should be ready for use if and when it is needed 6 weeks from now. I will see him back as needed.  [name] [facility] [phone]

## 2016-04-10 DIAGNOSIS — N139 Obstructive and reflux uropathy, unspecified: Secondary | ICD-10-CM | POA: Diagnosis not present

## 2016-04-10 DIAGNOSIS — R339 Retention of urine, unspecified: Secondary | ICD-10-CM | POA: Diagnosis not present

## 2016-04-10 DIAGNOSIS — N400 Enlarged prostate without lower urinary tract symptoms: Secondary | ICD-10-CM | POA: Diagnosis not present

## 2016-04-10 DIAGNOSIS — N390 Urinary tract infection, site not specified: Secondary | ICD-10-CM | POA: Diagnosis not present

## 2016-04-10 DIAGNOSIS — Z466 Encounter for fitting and adjustment of urinary device: Secondary | ICD-10-CM | POA: Diagnosis not present

## 2016-04-10 DIAGNOSIS — I129 Hypertensive chronic kidney disease with stage 1 through stage 4 chronic kidney disease, or unspecified chronic kidney disease: Secondary | ICD-10-CM | POA: Diagnosis not present

## 2016-04-10 DIAGNOSIS — D649 Anemia, unspecified: Secondary | ICD-10-CM | POA: Diagnosis not present

## 2016-04-10 DIAGNOSIS — E1165 Type 2 diabetes mellitus with hyperglycemia: Secondary | ICD-10-CM | POA: Diagnosis not present

## 2016-04-10 DIAGNOSIS — N184 Chronic kidney disease, stage 4 (severe): Secondary | ICD-10-CM | POA: Diagnosis not present

## 2016-04-11 DIAGNOSIS — E1165 Type 2 diabetes mellitus with hyperglycemia: Secondary | ICD-10-CM | POA: Diagnosis not present

## 2016-04-11 DIAGNOSIS — E039 Hypothyroidism, unspecified: Secondary | ICD-10-CM | POA: Diagnosis not present

## 2016-04-11 DIAGNOSIS — E785 Hyperlipidemia, unspecified: Secondary | ICD-10-CM | POA: Diagnosis not present

## 2016-04-11 DIAGNOSIS — N184 Chronic kidney disease, stage 4 (severe): Secondary | ICD-10-CM | POA: Diagnosis not present

## 2016-04-11 DIAGNOSIS — D649 Anemia, unspecified: Secondary | ICD-10-CM | POA: Diagnosis not present

## 2016-04-14 DIAGNOSIS — E1165 Type 2 diabetes mellitus with hyperglycemia: Secondary | ICD-10-CM | POA: Diagnosis not present

## 2016-04-14 DIAGNOSIS — I129 Hypertensive chronic kidney disease with stage 1 through stage 4 chronic kidney disease, or unspecified chronic kidney disease: Secondary | ICD-10-CM | POA: Diagnosis not present

## 2016-04-14 DIAGNOSIS — R339 Retention of urine, unspecified: Secondary | ICD-10-CM | POA: Diagnosis not present

## 2016-04-14 DIAGNOSIS — Z466 Encounter for fitting and adjustment of urinary device: Secondary | ICD-10-CM | POA: Diagnosis not present

## 2016-04-14 DIAGNOSIS — N400 Enlarged prostate without lower urinary tract symptoms: Secondary | ICD-10-CM | POA: Diagnosis not present

## 2016-04-14 DIAGNOSIS — N184 Chronic kidney disease, stage 4 (severe): Secondary | ICD-10-CM | POA: Diagnosis not present

## 2016-04-15 DIAGNOSIS — I129 Hypertensive chronic kidney disease with stage 1 through stage 4 chronic kidney disease, or unspecified chronic kidney disease: Secondary | ICD-10-CM | POA: Diagnosis not present

## 2016-04-15 DIAGNOSIS — E1165 Type 2 diabetes mellitus with hyperglycemia: Secondary | ICD-10-CM | POA: Diagnosis not present

## 2016-04-15 DIAGNOSIS — R339 Retention of urine, unspecified: Secondary | ICD-10-CM | POA: Diagnosis not present

## 2016-04-15 DIAGNOSIS — N184 Chronic kidney disease, stage 4 (severe): Secondary | ICD-10-CM | POA: Diagnosis not present

## 2016-04-15 DIAGNOSIS — Z466 Encounter for fitting and adjustment of urinary device: Secondary | ICD-10-CM | POA: Diagnosis not present

## 2016-04-15 DIAGNOSIS — N400 Enlarged prostate without lower urinary tract symptoms: Secondary | ICD-10-CM | POA: Diagnosis not present

## 2016-04-16 DIAGNOSIS — N400 Enlarged prostate without lower urinary tract symptoms: Secondary | ICD-10-CM | POA: Diagnosis not present

## 2016-04-16 DIAGNOSIS — I129 Hypertensive chronic kidney disease with stage 1 through stage 4 chronic kidney disease, or unspecified chronic kidney disease: Secondary | ICD-10-CM | POA: Diagnosis not present

## 2016-04-16 DIAGNOSIS — R339 Retention of urine, unspecified: Secondary | ICD-10-CM | POA: Diagnosis not present

## 2016-04-16 DIAGNOSIS — N184 Chronic kidney disease, stage 4 (severe): Secondary | ICD-10-CM | POA: Diagnosis not present

## 2016-04-16 DIAGNOSIS — E1165 Type 2 diabetes mellitus with hyperglycemia: Secondary | ICD-10-CM | POA: Diagnosis not present

## 2016-04-16 DIAGNOSIS — Z466 Encounter for fitting and adjustment of urinary device: Secondary | ICD-10-CM | POA: Diagnosis not present

## 2016-04-17 DIAGNOSIS — I129 Hypertensive chronic kidney disease with stage 1 through stage 4 chronic kidney disease, or unspecified chronic kidney disease: Secondary | ICD-10-CM | POA: Diagnosis not present

## 2016-04-17 DIAGNOSIS — N184 Chronic kidney disease, stage 4 (severe): Secondary | ICD-10-CM | POA: Diagnosis not present

## 2016-04-17 DIAGNOSIS — Z466 Encounter for fitting and adjustment of urinary device: Secondary | ICD-10-CM | POA: Diagnosis not present

## 2016-04-17 DIAGNOSIS — R339 Retention of urine, unspecified: Secondary | ICD-10-CM | POA: Diagnosis not present

## 2016-04-17 DIAGNOSIS — N400 Enlarged prostate without lower urinary tract symptoms: Secondary | ICD-10-CM | POA: Diagnosis not present

## 2016-04-17 DIAGNOSIS — E1165 Type 2 diabetes mellitus with hyperglycemia: Secondary | ICD-10-CM | POA: Diagnosis not present

## 2016-04-21 DIAGNOSIS — I129 Hypertensive chronic kidney disease with stage 1 through stage 4 chronic kidney disease, or unspecified chronic kidney disease: Secondary | ICD-10-CM | POA: Diagnosis not present

## 2016-04-21 DIAGNOSIS — Z466 Encounter for fitting and adjustment of urinary device: Secondary | ICD-10-CM | POA: Diagnosis not present

## 2016-04-21 DIAGNOSIS — N400 Enlarged prostate without lower urinary tract symptoms: Secondary | ICD-10-CM | POA: Diagnosis not present

## 2016-04-21 DIAGNOSIS — E1165 Type 2 diabetes mellitus with hyperglycemia: Secondary | ICD-10-CM | POA: Diagnosis not present

## 2016-04-21 DIAGNOSIS — R339 Retention of urine, unspecified: Secondary | ICD-10-CM | POA: Diagnosis not present

## 2016-04-21 DIAGNOSIS — N184 Chronic kidney disease, stage 4 (severe): Secondary | ICD-10-CM | POA: Diagnosis not present

## 2016-04-22 DIAGNOSIS — N184 Chronic kidney disease, stage 4 (severe): Secondary | ICD-10-CM | POA: Diagnosis not present

## 2016-04-22 DIAGNOSIS — Z466 Encounter for fitting and adjustment of urinary device: Secondary | ICD-10-CM | POA: Diagnosis not present

## 2016-04-22 DIAGNOSIS — I129 Hypertensive chronic kidney disease with stage 1 through stage 4 chronic kidney disease, or unspecified chronic kidney disease: Secondary | ICD-10-CM | POA: Diagnosis not present

## 2016-04-22 DIAGNOSIS — R339 Retention of urine, unspecified: Secondary | ICD-10-CM | POA: Diagnosis not present

## 2016-04-22 DIAGNOSIS — N400 Enlarged prostate without lower urinary tract symptoms: Secondary | ICD-10-CM | POA: Diagnosis not present

## 2016-04-22 DIAGNOSIS — E1165 Type 2 diabetes mellitus with hyperglycemia: Secondary | ICD-10-CM | POA: Diagnosis not present

## 2016-04-23 DIAGNOSIS — Z466 Encounter for fitting and adjustment of urinary device: Secondary | ICD-10-CM | POA: Diagnosis not present

## 2016-04-23 DIAGNOSIS — R339 Retention of urine, unspecified: Secondary | ICD-10-CM | POA: Diagnosis not present

## 2016-04-23 DIAGNOSIS — N400 Enlarged prostate without lower urinary tract symptoms: Secondary | ICD-10-CM | POA: Diagnosis not present

## 2016-04-23 DIAGNOSIS — N184 Chronic kidney disease, stage 4 (severe): Secondary | ICD-10-CM | POA: Diagnosis not present

## 2016-04-23 DIAGNOSIS — E1165 Type 2 diabetes mellitus with hyperglycemia: Secondary | ICD-10-CM | POA: Diagnosis not present

## 2016-04-23 DIAGNOSIS — I129 Hypertensive chronic kidney disease with stage 1 through stage 4 chronic kidney disease, or unspecified chronic kidney disease: Secondary | ICD-10-CM | POA: Diagnosis not present

## 2016-04-28 DIAGNOSIS — R339 Retention of urine, unspecified: Secondary | ICD-10-CM | POA: Diagnosis not present

## 2016-04-28 DIAGNOSIS — Z466 Encounter for fitting and adjustment of urinary device: Secondary | ICD-10-CM | POA: Diagnosis not present

## 2016-04-28 DIAGNOSIS — E1165 Type 2 diabetes mellitus with hyperglycemia: Secondary | ICD-10-CM | POA: Diagnosis not present

## 2016-04-28 DIAGNOSIS — N400 Enlarged prostate without lower urinary tract symptoms: Secondary | ICD-10-CM | POA: Diagnosis not present

## 2016-04-28 DIAGNOSIS — I129 Hypertensive chronic kidney disease with stage 1 through stage 4 chronic kidney disease, or unspecified chronic kidney disease: Secondary | ICD-10-CM | POA: Diagnosis not present

## 2016-04-28 DIAGNOSIS — N184 Chronic kidney disease, stage 4 (severe): Secondary | ICD-10-CM | POA: Diagnosis not present

## 2016-04-30 DIAGNOSIS — I10 Essential (primary) hypertension: Secondary | ICD-10-CM | POA: Diagnosis not present

## 2016-04-30 DIAGNOSIS — I129 Hypertensive chronic kidney disease with stage 1 through stage 4 chronic kidney disease, or unspecified chronic kidney disease: Secondary | ICD-10-CM | POA: Diagnosis not present

## 2016-04-30 DIAGNOSIS — E872 Acidosis: Secondary | ICD-10-CM | POA: Diagnosis not present

## 2016-04-30 DIAGNOSIS — N400 Enlarged prostate without lower urinary tract symptoms: Secondary | ICD-10-CM | POA: Diagnosis not present

## 2016-04-30 DIAGNOSIS — E1165 Type 2 diabetes mellitus with hyperglycemia: Secondary | ICD-10-CM | POA: Diagnosis not present

## 2016-04-30 DIAGNOSIS — Z466 Encounter for fitting and adjustment of urinary device: Secondary | ICD-10-CM | POA: Diagnosis not present

## 2016-04-30 DIAGNOSIS — N184 Chronic kidney disease, stage 4 (severe): Secondary | ICD-10-CM | POA: Diagnosis not present

## 2016-04-30 DIAGNOSIS — E875 Hyperkalemia: Secondary | ICD-10-CM | POA: Diagnosis not present

## 2016-04-30 DIAGNOSIS — R339 Retention of urine, unspecified: Secondary | ICD-10-CM | POA: Diagnosis not present

## 2016-04-30 DIAGNOSIS — D638 Anemia in other chronic diseases classified elsewhere: Secondary | ICD-10-CM | POA: Diagnosis not present

## 2016-04-30 DIAGNOSIS — D509 Iron deficiency anemia, unspecified: Secondary | ICD-10-CM | POA: Diagnosis not present

## 2016-05-01 DIAGNOSIS — N184 Chronic kidney disease, stage 4 (severe): Secondary | ICD-10-CM | POA: Diagnosis not present

## 2016-05-01 DIAGNOSIS — R339 Retention of urine, unspecified: Secondary | ICD-10-CM | POA: Diagnosis not present

## 2016-05-01 DIAGNOSIS — N400 Enlarged prostate without lower urinary tract symptoms: Secondary | ICD-10-CM | POA: Diagnosis not present

## 2016-05-01 DIAGNOSIS — E1165 Type 2 diabetes mellitus with hyperglycemia: Secondary | ICD-10-CM | POA: Diagnosis not present

## 2016-05-01 DIAGNOSIS — Z466 Encounter for fitting and adjustment of urinary device: Secondary | ICD-10-CM | POA: Diagnosis not present

## 2016-05-01 DIAGNOSIS — I129 Hypertensive chronic kidney disease with stage 1 through stage 4 chronic kidney disease, or unspecified chronic kidney disease: Secondary | ICD-10-CM | POA: Diagnosis not present

## 2016-05-07 DIAGNOSIS — E1165 Type 2 diabetes mellitus with hyperglycemia: Secondary | ICD-10-CM | POA: Diagnosis not present

## 2016-05-07 DIAGNOSIS — I129 Hypertensive chronic kidney disease with stage 1 through stage 4 chronic kidney disease, or unspecified chronic kidney disease: Secondary | ICD-10-CM | POA: Diagnosis not present

## 2016-05-07 DIAGNOSIS — N184 Chronic kidney disease, stage 4 (severe): Secondary | ICD-10-CM | POA: Diagnosis not present

## 2016-05-07 DIAGNOSIS — N400 Enlarged prostate without lower urinary tract symptoms: Secondary | ICD-10-CM | POA: Diagnosis not present

## 2016-05-07 DIAGNOSIS — Z466 Encounter for fitting and adjustment of urinary device: Secondary | ICD-10-CM | POA: Diagnosis not present

## 2016-05-07 DIAGNOSIS — R339 Retention of urine, unspecified: Secondary | ICD-10-CM | POA: Diagnosis not present

## 2016-05-08 DIAGNOSIS — N139 Obstructive and reflux uropathy, unspecified: Secondary | ICD-10-CM | POA: Diagnosis not present

## 2016-05-08 DIAGNOSIS — I10 Essential (primary) hypertension: Secondary | ICD-10-CM | POA: Diagnosis not present

## 2016-05-08 DIAGNOSIS — F79 Unspecified intellectual disabilities: Secondary | ICD-10-CM | POA: Diagnosis not present

## 2016-05-08 DIAGNOSIS — E1165 Type 2 diabetes mellitus with hyperglycemia: Secondary | ICD-10-CM | POA: Diagnosis not present

## 2016-05-08 DIAGNOSIS — D649 Anemia, unspecified: Secondary | ICD-10-CM | POA: Diagnosis not present

## 2016-05-08 DIAGNOSIS — N184 Chronic kidney disease, stage 4 (severe): Secondary | ICD-10-CM | POA: Diagnosis not present

## 2016-05-14 DIAGNOSIS — R339 Retention of urine, unspecified: Secondary | ICD-10-CM | POA: Diagnosis not present

## 2016-05-14 DIAGNOSIS — E1165 Type 2 diabetes mellitus with hyperglycemia: Secondary | ICD-10-CM | POA: Diagnosis not present

## 2016-05-14 DIAGNOSIS — Z466 Encounter for fitting and adjustment of urinary device: Secondary | ICD-10-CM | POA: Diagnosis not present

## 2016-05-14 DIAGNOSIS — N184 Chronic kidney disease, stage 4 (severe): Secondary | ICD-10-CM | POA: Diagnosis not present

## 2016-05-14 DIAGNOSIS — I129 Hypertensive chronic kidney disease with stage 1 through stage 4 chronic kidney disease, or unspecified chronic kidney disease: Secondary | ICD-10-CM | POA: Diagnosis not present

## 2016-05-14 DIAGNOSIS — N400 Enlarged prostate without lower urinary tract symptoms: Secondary | ICD-10-CM | POA: Diagnosis not present

## 2016-05-17 DIAGNOSIS — N184 Chronic kidney disease, stage 4 (severe): Secondary | ICD-10-CM | POA: Diagnosis not present

## 2016-05-17 DIAGNOSIS — I129 Hypertensive chronic kidney disease with stage 1 through stage 4 chronic kidney disease, or unspecified chronic kidney disease: Secondary | ICD-10-CM | POA: Diagnosis not present

## 2016-05-17 DIAGNOSIS — E1165 Type 2 diabetes mellitus with hyperglycemia: Secondary | ICD-10-CM | POA: Diagnosis not present

## 2016-05-17 DIAGNOSIS — E1122 Type 2 diabetes mellitus with diabetic chronic kidney disease: Secondary | ICD-10-CM | POA: Diagnosis not present

## 2016-05-17 DIAGNOSIS — R339 Retention of urine, unspecified: Secondary | ICD-10-CM | POA: Diagnosis not present

## 2016-05-17 DIAGNOSIS — Z466 Encounter for fitting and adjustment of urinary device: Secondary | ICD-10-CM | POA: Diagnosis not present

## 2016-05-17 DIAGNOSIS — N400 Enlarged prostate without lower urinary tract symptoms: Secondary | ICD-10-CM | POA: Diagnosis not present

## 2016-05-17 DIAGNOSIS — Z87440 Personal history of urinary (tract) infections: Secondary | ICD-10-CM | POA: Diagnosis not present

## 2016-05-17 DIAGNOSIS — F79 Unspecified intellectual disabilities: Secondary | ICD-10-CM | POA: Diagnosis not present

## 2016-05-21 DIAGNOSIS — M79675 Pain in left toe(s): Secondary | ICD-10-CM | POA: Diagnosis not present

## 2016-05-21 DIAGNOSIS — M79674 Pain in right toe(s): Secondary | ICD-10-CM | POA: Diagnosis not present

## 2016-05-21 DIAGNOSIS — B351 Tinea unguium: Secondary | ICD-10-CM | POA: Diagnosis not present

## 2016-06-02 ENCOUNTER — Other Ambulatory Visit (HOSPITAL_COMMUNITY)
Admission: AD | Admit: 2016-06-02 | Discharge: 2016-06-02 | Disposition: A | Discharge status: Home / Self Care | Payer: Medicare Other | Source: Skilled Nursing Facility | Attending: Internal Medicine | Admitting: Internal Medicine

## 2016-06-02 DIAGNOSIS — R339 Retention of urine, unspecified: Secondary | ICD-10-CM | POA: Diagnosis not present

## 2016-06-02 DIAGNOSIS — E1122 Type 2 diabetes mellitus with diabetic chronic kidney disease: Secondary | ICD-10-CM | POA: Diagnosis not present

## 2016-06-02 DIAGNOSIS — N184 Chronic kidney disease, stage 4 (severe): Secondary | ICD-10-CM | POA: Diagnosis not present

## 2016-06-02 DIAGNOSIS — I129 Hypertensive chronic kidney disease with stage 1 through stage 4 chronic kidney disease, or unspecified chronic kidney disease: Secondary | ICD-10-CM | POA: Diagnosis not present

## 2016-06-02 DIAGNOSIS — Z466 Encounter for fitting and adjustment of urinary device: Secondary | ICD-10-CM | POA: Diagnosis not present

## 2016-06-02 DIAGNOSIS — N390 Urinary tract infection, site not specified: Secondary | ICD-10-CM | POA: Diagnosis not present

## 2016-06-02 DIAGNOSIS — E1165 Type 2 diabetes mellitus with hyperglycemia: Secondary | ICD-10-CM | POA: Diagnosis not present

## 2016-06-02 LAB — URINALYSIS, ROUTINE W REFLEX MICROSCOPIC
Bilirubin Urine: NEGATIVE
Glucose, UA: NEGATIVE mg/dL
Ketones, ur: NEGATIVE mg/dL
Nitrite: NEGATIVE
Protein, ur: 100 mg/dL — AB
Specific Gravity, Urine: 1.015 (ref 1.005–1.030)
pH: 6 (ref 5.0–8.0)

## 2016-06-02 LAB — URINE MICROSCOPIC-ADD ON

## 2016-06-04 DIAGNOSIS — Z466 Encounter for fitting and adjustment of urinary device: Secondary | ICD-10-CM | POA: Diagnosis not present

## 2016-06-04 DIAGNOSIS — E1122 Type 2 diabetes mellitus with diabetic chronic kidney disease: Secondary | ICD-10-CM | POA: Diagnosis not present

## 2016-06-04 DIAGNOSIS — I129 Hypertensive chronic kidney disease with stage 1 through stage 4 chronic kidney disease, or unspecified chronic kidney disease: Secondary | ICD-10-CM | POA: Diagnosis not present

## 2016-06-04 DIAGNOSIS — N184 Chronic kidney disease, stage 4 (severe): Secondary | ICD-10-CM | POA: Diagnosis not present

## 2016-06-04 DIAGNOSIS — R339 Retention of urine, unspecified: Secondary | ICD-10-CM | POA: Diagnosis not present

## 2016-06-04 DIAGNOSIS — E1165 Type 2 diabetes mellitus with hyperglycemia: Secondary | ICD-10-CM | POA: Diagnosis not present

## 2016-06-05 DIAGNOSIS — N139 Obstructive and reflux uropathy, unspecified: Secondary | ICD-10-CM | POA: Diagnosis not present

## 2016-06-05 DIAGNOSIS — N390 Urinary tract infection, site not specified: Secondary | ICD-10-CM | POA: Diagnosis not present

## 2016-06-05 DIAGNOSIS — E1165 Type 2 diabetes mellitus with hyperglycemia: Secondary | ICD-10-CM | POA: Diagnosis not present

## 2016-06-05 DIAGNOSIS — N184 Chronic kidney disease, stage 4 (severe): Secondary | ICD-10-CM | POA: Diagnosis not present

## 2016-06-05 LAB — URINE CULTURE: Culture: >=100000 — AB

## 2016-06-10 DIAGNOSIS — E1122 Type 2 diabetes mellitus with diabetic chronic kidney disease: Secondary | ICD-10-CM | POA: Diagnosis not present

## 2016-06-10 DIAGNOSIS — E1165 Type 2 diabetes mellitus with hyperglycemia: Secondary | ICD-10-CM | POA: Diagnosis not present

## 2016-06-10 DIAGNOSIS — R339 Retention of urine, unspecified: Secondary | ICD-10-CM | POA: Diagnosis not present

## 2016-06-10 DIAGNOSIS — I129 Hypertensive chronic kidney disease with stage 1 through stage 4 chronic kidney disease, or unspecified chronic kidney disease: Secondary | ICD-10-CM | POA: Diagnosis not present

## 2016-06-10 DIAGNOSIS — Z466 Encounter for fitting and adjustment of urinary device: Secondary | ICD-10-CM | POA: Diagnosis not present

## 2016-06-10 DIAGNOSIS — N184 Chronic kidney disease, stage 4 (severe): Secondary | ICD-10-CM | POA: Diagnosis not present

## 2016-06-16 DIAGNOSIS — I129 Hypertensive chronic kidney disease with stage 1 through stage 4 chronic kidney disease, or unspecified chronic kidney disease: Secondary | ICD-10-CM | POA: Diagnosis not present

## 2016-06-16 DIAGNOSIS — E1165 Type 2 diabetes mellitus with hyperglycemia: Secondary | ICD-10-CM | POA: Diagnosis not present

## 2016-06-16 DIAGNOSIS — Z466 Encounter for fitting and adjustment of urinary device: Secondary | ICD-10-CM | POA: Diagnosis not present

## 2016-06-16 DIAGNOSIS — R339 Retention of urine, unspecified: Secondary | ICD-10-CM | POA: Diagnosis not present

## 2016-06-16 DIAGNOSIS — E1122 Type 2 diabetes mellitus with diabetic chronic kidney disease: Secondary | ICD-10-CM | POA: Diagnosis not present

## 2016-06-16 DIAGNOSIS — N184 Chronic kidney disease, stage 4 (severe): Secondary | ICD-10-CM | POA: Diagnosis not present

## 2016-06-20 DIAGNOSIS — E1165 Type 2 diabetes mellitus with hyperglycemia: Secondary | ICD-10-CM | POA: Diagnosis not present

## 2016-06-25 DIAGNOSIS — I10 Essential (primary) hypertension: Secondary | ICD-10-CM | POA: Diagnosis not present

## 2016-06-25 DIAGNOSIS — E559 Vitamin D deficiency, unspecified: Secondary | ICD-10-CM | POA: Diagnosis not present

## 2016-06-25 DIAGNOSIS — R809 Proteinuria, unspecified: Secondary | ICD-10-CM | POA: Diagnosis not present

## 2016-06-25 DIAGNOSIS — N185 Chronic kidney disease, stage 5: Secondary | ICD-10-CM | POA: Diagnosis not present

## 2016-06-25 DIAGNOSIS — Z79899 Other long term (current) drug therapy: Secondary | ICD-10-CM | POA: Diagnosis not present

## 2016-06-25 DIAGNOSIS — D509 Iron deficiency anemia, unspecified: Secondary | ICD-10-CM | POA: Diagnosis not present

## 2016-07-02 DIAGNOSIS — N184 Chronic kidney disease, stage 4 (severe): Secondary | ICD-10-CM | POA: Diagnosis not present

## 2016-07-02 DIAGNOSIS — E875 Hyperkalemia: Secondary | ICD-10-CM | POA: Diagnosis not present

## 2016-07-02 DIAGNOSIS — E1129 Type 2 diabetes mellitus with other diabetic kidney complication: Secondary | ICD-10-CM | POA: Diagnosis not present

## 2016-07-02 DIAGNOSIS — D509 Iron deficiency anemia, unspecified: Secondary | ICD-10-CM | POA: Diagnosis not present

## 2016-07-02 DIAGNOSIS — I10 Essential (primary) hypertension: Secondary | ICD-10-CM | POA: Diagnosis not present

## 2016-07-02 DIAGNOSIS — E872 Acidosis: Secondary | ICD-10-CM | POA: Diagnosis not present

## 2016-07-02 DIAGNOSIS — R600 Localized edema: Secondary | ICD-10-CM | POA: Diagnosis not present

## 2016-07-10 DIAGNOSIS — N184 Chronic kidney disease, stage 4 (severe): Secondary | ICD-10-CM | POA: Diagnosis not present

## 2016-07-10 DIAGNOSIS — E1122 Type 2 diabetes mellitus with diabetic chronic kidney disease: Secondary | ICD-10-CM | POA: Diagnosis not present

## 2016-07-10 DIAGNOSIS — R339 Retention of urine, unspecified: Secondary | ICD-10-CM | POA: Diagnosis not present

## 2016-07-10 DIAGNOSIS — Z466 Encounter for fitting and adjustment of urinary device: Secondary | ICD-10-CM | POA: Diagnosis not present

## 2016-07-10 DIAGNOSIS — I129 Hypertensive chronic kidney disease with stage 1 through stage 4 chronic kidney disease, or unspecified chronic kidney disease: Secondary | ICD-10-CM | POA: Diagnosis not present

## 2016-07-10 DIAGNOSIS — E1165 Type 2 diabetes mellitus with hyperglycemia: Secondary | ICD-10-CM | POA: Diagnosis not present

## 2016-07-11 DIAGNOSIS — I129 Hypertensive chronic kidney disease with stage 1 through stage 4 chronic kidney disease, or unspecified chronic kidney disease: Secondary | ICD-10-CM | POA: Diagnosis not present

## 2016-07-11 DIAGNOSIS — Z466 Encounter for fitting and adjustment of urinary device: Secondary | ICD-10-CM | POA: Diagnosis not present

## 2016-07-11 DIAGNOSIS — E1165 Type 2 diabetes mellitus with hyperglycemia: Secondary | ICD-10-CM | POA: Diagnosis not present

## 2016-07-11 DIAGNOSIS — E1122 Type 2 diabetes mellitus with diabetic chronic kidney disease: Secondary | ICD-10-CM | POA: Diagnosis not present

## 2016-07-11 DIAGNOSIS — N184 Chronic kidney disease, stage 4 (severe): Secondary | ICD-10-CM | POA: Diagnosis not present

## 2016-07-11 DIAGNOSIS — R339 Retention of urine, unspecified: Secondary | ICD-10-CM | POA: Diagnosis not present

## 2016-07-16 DIAGNOSIS — R339 Retention of urine, unspecified: Secondary | ICD-10-CM | POA: Diagnosis not present

## 2016-07-16 DIAGNOSIS — N184 Chronic kidney disease, stage 4 (severe): Secondary | ICD-10-CM | POA: Diagnosis not present

## 2016-07-16 DIAGNOSIS — Z466 Encounter for fitting and adjustment of urinary device: Secondary | ICD-10-CM | POA: Diagnosis not present

## 2016-07-16 DIAGNOSIS — I129 Hypertensive chronic kidney disease with stage 1 through stage 4 chronic kidney disease, or unspecified chronic kidney disease: Secondary | ICD-10-CM | POA: Diagnosis not present

## 2016-07-16 DIAGNOSIS — N400 Enlarged prostate without lower urinary tract symptoms: Secondary | ICD-10-CM | POA: Diagnosis not present

## 2016-07-16 DIAGNOSIS — E1165 Type 2 diabetes mellitus with hyperglycemia: Secondary | ICD-10-CM | POA: Diagnosis not present

## 2016-07-16 DIAGNOSIS — F79 Unspecified intellectual disabilities: Secondary | ICD-10-CM | POA: Diagnosis not present

## 2016-07-16 DIAGNOSIS — Z87440 Personal history of urinary (tract) infections: Secondary | ICD-10-CM | POA: Diagnosis not present

## 2016-07-16 DIAGNOSIS — E1122 Type 2 diabetes mellitus with diabetic chronic kidney disease: Secondary | ICD-10-CM | POA: Diagnosis not present

## 2016-07-17 ENCOUNTER — Encounter (HOSPITAL_COMMUNITY)
Admission: RE | Admit: 2016-07-17 | Discharge: 2016-07-17 | Disposition: A | Discharge status: Home / Self Care | Payer: Medicare Other | Source: Ambulatory Visit | Attending: Nephrology | Admitting: Nephrology

## 2016-07-17 DIAGNOSIS — N184 Chronic kidney disease, stage 4 (severe): Secondary | ICD-10-CM | POA: Diagnosis not present

## 2016-07-17 DIAGNOSIS — D509 Iron deficiency anemia, unspecified: Secondary | ICD-10-CM | POA: Diagnosis not present

## 2016-07-17 LAB — HEMOGLOBIN AND HEMATOCRIT, BLOOD
HCT: 26.8 % — ABNORMAL LOW (ref 39.0–52.0)
Hemoglobin: 8.8 g/dL — ABNORMAL LOW (ref 13.0–17.0)

## 2016-07-17 MED ORDER — EPOETIN ALFA 4000 UNIT/ML IJ SOLN
4000.0000 [IU] | Freq: Once | INTRAMUSCULAR | Status: AC
  Administered 2016-07-17: 4000 [IU] via SUBCUTANEOUS

## 2016-07-17 MED ORDER — EPOETIN ALFA 4000 UNIT/ML IJ SOLN
INTRAMUSCULAR | Status: AC
  Filled 2016-07-17: qty 1

## 2016-07-17 NOTE — Progress Notes (Signed)
Results for DANEN, LAPAGLIA (MRN [id number]) as of 07/17/2016 11:09  Ref. Range 07/17/2016 10:17  Hemoglobin Latest Ref Range: 13.0 - 17.0 g/dL 8.8 (L)  HCT Latest Ref Range: 39.0 - 52.0 % 26.8 (L)

## 2016-07-21 DIAGNOSIS — E119 Type 2 diabetes mellitus without complications: Secondary | ICD-10-CM | POA: Diagnosis not present

## 2016-07-21 DIAGNOSIS — E113293 Type 2 diabetes mellitus with mild nonproliferative diabetic retinopathy without macular edema, bilateral: Secondary | ICD-10-CM | POA: Diagnosis not present

## 2016-07-21 DIAGNOSIS — E11319 Type 2 diabetes mellitus with unspecified diabetic retinopathy without macular edema: Secondary | ICD-10-CM | POA: Diagnosis not present

## 2016-07-21 DIAGNOSIS — H2513 Age-related nuclear cataract, bilateral: Secondary | ICD-10-CM | POA: Diagnosis not present

## 2016-07-25 ENCOUNTER — Ambulatory Visit (INDEPENDENT_AMBULATORY_CARE_PROVIDER_SITE_OTHER): Payer: Medicare Other | Admitting: Urology

## 2016-07-25 DIAGNOSIS — R330 Drug induced retention of urine: Secondary | ICD-10-CM

## 2016-07-31 ENCOUNTER — Encounter (HOSPITAL_COMMUNITY)
Admission: RE | Admit: 2016-07-31 | Discharge: 2016-07-31 | Disposition: A | Discharge status: Home / Self Care | Payer: Medicare Other | Source: Ambulatory Visit | Attending: Nephrology | Admitting: Nephrology

## 2016-07-31 ENCOUNTER — Encounter (HOSPITAL_COMMUNITY): Payer: Self-pay

## 2016-07-31 DIAGNOSIS — N184 Chronic kidney disease, stage 4 (severe): Secondary | ICD-10-CM | POA: Diagnosis not present

## 2016-07-31 DIAGNOSIS — D509 Iron deficiency anemia, unspecified: Secondary | ICD-10-CM | POA: Insufficient documentation

## 2016-07-31 LAB — HEMOGLOBIN AND HEMATOCRIT, BLOOD
HCT: 26.4 % — ABNORMAL LOW (ref 39.0–52.0)
Hemoglobin: 8.7 g/dL — ABNORMAL LOW (ref 13.0–17.0)

## 2016-07-31 MED ORDER — EPOETIN ALFA 4000 UNIT/ML IJ SOLN
INTRAMUSCULAR | Status: AC
  Filled 2016-07-31: qty 1

## 2016-07-31 MED ORDER — EPOETIN ALFA 4000 UNIT/ML IJ SOLN
4000.0000 [IU] | Freq: Once | INTRAMUSCULAR | Status: AC
  Administered 2016-07-31: 4000 [IU] via SUBCUTANEOUS

## 2016-07-31 MED ORDER — EPOETIN ALFA 2000 UNIT/ML IJ SOLN: 4000.0000 [IU] | Freq: Once | INTRAMUSCULAR | Status: DC

## 2016-07-31 NOTE — Progress Notes (Signed)
Results for OVIDE, DUSEK (MRN [id number]) as of 07/31/2016 12:00  Ref. Range 07/31/2016 09:48  Hemoglobin Latest Ref Range: 13.0 - 17.0 g/dL 8.7 (L)  HCT Latest Ref Range: 39.0 - 52.0 % 26.4 (L)

## 2016-08-01 DIAGNOSIS — D509 Iron deficiency anemia, unspecified: Secondary | ICD-10-CM | POA: Diagnosis not present

## 2016-08-01 DIAGNOSIS — N184 Chronic kidney disease, stage 4 (severe): Secondary | ICD-10-CM | POA: Diagnosis not present

## 2016-08-01 DIAGNOSIS — E875 Hyperkalemia: Secondary | ICD-10-CM | POA: Diagnosis not present

## 2016-08-01 DIAGNOSIS — E872 Acidosis: Secondary | ICD-10-CM | POA: Diagnosis not present

## 2016-08-01 DIAGNOSIS — I10 Essential (primary) hypertension: Secondary | ICD-10-CM | POA: Diagnosis not present

## 2016-08-01 DIAGNOSIS — Z79899 Other long term (current) drug therapy: Secondary | ICD-10-CM | POA: Diagnosis not present

## 2016-08-06 ENCOUNTER — Encounter (INDEPENDENT_AMBULATORY_CARE_PROVIDER_SITE_OTHER): Payer: Medicare Other | Admitting: Ophthalmology

## 2016-08-06 DIAGNOSIS — E11311 Type 2 diabetes mellitus with unspecified diabetic retinopathy with macular edema: Secondary | ICD-10-CM | POA: Diagnosis not present

## 2016-08-06 DIAGNOSIS — H353121 Nonexudative age-related macular degeneration, left eye, early dry stage: Secondary | ICD-10-CM

## 2016-08-06 DIAGNOSIS — E113313 Type 2 diabetes mellitus with moderate nonproliferative diabetic retinopathy with macular edema, bilateral: Secondary | ICD-10-CM

## 2016-08-06 DIAGNOSIS — H43813 Vitreous degeneration, bilateral: Secondary | ICD-10-CM

## 2016-08-08 DIAGNOSIS — D638 Anemia in other chronic diseases classified elsewhere: Secondary | ICD-10-CM | POA: Diagnosis not present

## 2016-08-08 DIAGNOSIS — E872 Acidosis: Secondary | ICD-10-CM | POA: Diagnosis not present

## 2016-08-08 DIAGNOSIS — N2581 Secondary hyperparathyroidism of renal origin: Secondary | ICD-10-CM | POA: Diagnosis not present

## 2016-08-08 DIAGNOSIS — I10 Essential (primary) hypertension: Secondary | ICD-10-CM | POA: Diagnosis not present

## 2016-08-08 DIAGNOSIS — N185 Chronic kidney disease, stage 5: Secondary | ICD-10-CM | POA: Diagnosis not present

## 2016-08-08 DIAGNOSIS — Z111 Encounter for screening for respiratory tuberculosis: Secondary | ICD-10-CM | POA: Diagnosis not present

## 2016-08-11 ENCOUNTER — Other Ambulatory Visit (HOSPITAL_COMMUNITY)
Admission: RE | Admit: 2016-08-11 | Discharge: 2016-08-11 | Disposition: A | Discharge status: Home / Self Care | Payer: Medicare Other | Source: Ambulatory Visit | Attending: Internal Medicine | Admitting: Internal Medicine

## 2016-08-11 DIAGNOSIS — N186 End stage renal disease: Secondary | ICD-10-CM | POA: Insufficient documentation

## 2016-08-11 DIAGNOSIS — I129 Hypertensive chronic kidney disease with stage 1 through stage 4 chronic kidney disease, or unspecified chronic kidney disease: Secondary | ICD-10-CM | POA: Diagnosis not present

## 2016-08-11 DIAGNOSIS — Z466 Encounter for fitting and adjustment of urinary device: Secondary | ICD-10-CM | POA: Diagnosis not present

## 2016-08-11 DIAGNOSIS — Z1159 Encounter for screening for other viral diseases: Secondary | ICD-10-CM | POA: Diagnosis not present

## 2016-08-11 DIAGNOSIS — R339 Retention of urine, unspecified: Secondary | ICD-10-CM | POA: Diagnosis not present

## 2016-08-11 DIAGNOSIS — E1122 Type 2 diabetes mellitus with diabetic chronic kidney disease: Secondary | ICD-10-CM | POA: Diagnosis not present

## 2016-08-11 DIAGNOSIS — N400 Enlarged prostate without lower urinary tract symptoms: Secondary | ICD-10-CM | POA: Diagnosis not present

## 2016-08-11 DIAGNOSIS — N184 Chronic kidney disease, stage 4 (severe): Secondary | ICD-10-CM | POA: Diagnosis not present

## 2016-08-12 DIAGNOSIS — E1165 Type 2 diabetes mellitus with hyperglycemia: Secondary | ICD-10-CM | POA: Diagnosis not present

## 2016-08-12 DIAGNOSIS — E1122 Type 2 diabetes mellitus with diabetic chronic kidney disease: Secondary | ICD-10-CM | POA: Diagnosis not present

## 2016-08-12 DIAGNOSIS — Z466 Encounter for fitting and adjustment of urinary device: Secondary | ICD-10-CM | POA: Diagnosis not present

## 2016-08-12 DIAGNOSIS — N400 Enlarged prostate without lower urinary tract symptoms: Secondary | ICD-10-CM | POA: Diagnosis not present

## 2016-08-12 DIAGNOSIS — R339 Retention of urine, unspecified: Secondary | ICD-10-CM | POA: Diagnosis not present

## 2016-08-12 DIAGNOSIS — N184 Chronic kidney disease, stage 4 (severe): Secondary | ICD-10-CM | POA: Diagnosis not present

## 2016-08-12 DIAGNOSIS — I129 Hypertensive chronic kidney disease with stage 1 through stage 4 chronic kidney disease, or unspecified chronic kidney disease: Secondary | ICD-10-CM | POA: Diagnosis not present

## 2016-08-12 LAB — HEPATITIS B SURFACE ANTIGEN: Hepatitis B Surface Ag: NEGATIVE

## 2016-08-12 LAB — HEPATITIS C ANTIBODY: HCV Ab: <0.1 s/co ratio (ref 0.0–0.9)

## 2016-08-14 ENCOUNTER — Ambulatory Visit (HOSPITAL_COMMUNITY): Payer: Medicare Other

## 2016-08-14 ENCOUNTER — Other Ambulatory Visit (HOSPITAL_COMMUNITY): Payer: Medicare Other

## 2016-08-14 DIAGNOSIS — R338 Other retention of urine: Secondary | ICD-10-CM | POA: Diagnosis not present

## 2016-08-15 ENCOUNTER — Encounter (HOSPITAL_COMMUNITY): Payer: Self-pay

## 2016-08-15 ENCOUNTER — Encounter (HOSPITAL_COMMUNITY)
Admission: RE | Admit: 2016-08-15 | Discharge: 2016-08-15 | Disposition: A | Discharge status: Home / Self Care | Payer: Medicare Other | Source: Ambulatory Visit | Attending: Nephrology | Admitting: Nephrology

## 2016-08-15 DIAGNOSIS — D509 Iron deficiency anemia, unspecified: Secondary | ICD-10-CM | POA: Diagnosis not present

## 2016-08-15 DIAGNOSIS — N184 Chronic kidney disease, stage 4 (severe): Secondary | ICD-10-CM | POA: Diagnosis not present

## 2016-08-15 LAB — POCT HEMOGLOBIN-HEMACUE: Hemoglobin: 8.2 g/dL — ABNORMAL LOW (ref 13.0–17.0)

## 2016-08-15 MED ORDER — EPOETIN ALFA 4000 UNIT/ML IJ SOLN
4000.0000 [IU] | Freq: Once | INTRAMUSCULAR | Status: AC
  Administered 2016-08-15: 4000 [IU] via SUBCUTANEOUS

## 2016-08-15 MED ORDER — EPOETIN ALFA 2000 UNIT/ML IJ SOLN
INTRAMUSCULAR | Status: AC
  Filled 2016-08-15: qty 2

## 2016-08-15 NOTE — Progress Notes (Signed)
Results for TIMTOHY, BROSKI (MRN [id number]) as of 08/15/2016 14:53  Ref. Range 08/15/2016 13:09  Hemoglobin Latest Ref Range: 13.0 - 17.0 g/dL 8.2 (L)

## 2016-08-16 DIAGNOSIS — N186 End stage renal disease: Secondary | ICD-10-CM | POA: Diagnosis not present

## 2016-08-16 DIAGNOSIS — Z992 Dependence on renal dialysis: Secondary | ICD-10-CM | POA: Diagnosis not present

## 2016-08-17 IMAGING — US US RENAL
1 series · 14 of 25 positions shown · non-contrast
Comparison: 04/16/2015

CLINICAL DATA: Hydronephrosis.

EXAM:
RENAL / URINARY TRACT ULTRASOUND COMPLETE

[Series 1: us renal · 0.22mm/px · 14 of 28 slices shown]
[im 1/28]
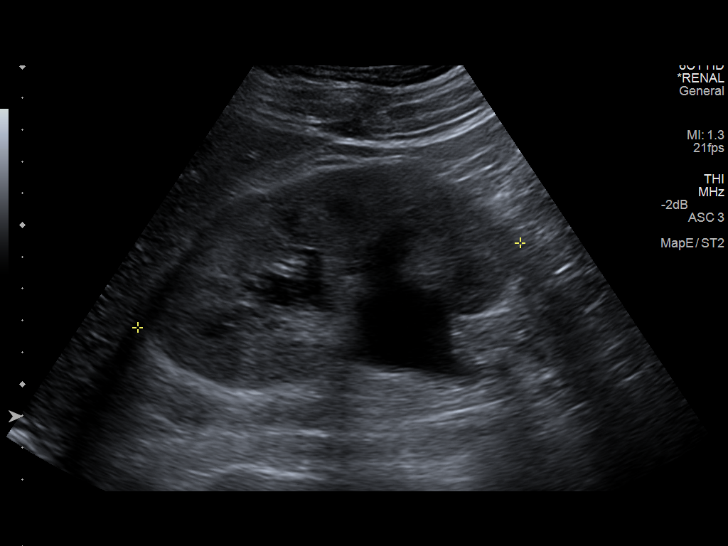
[im 3/28]
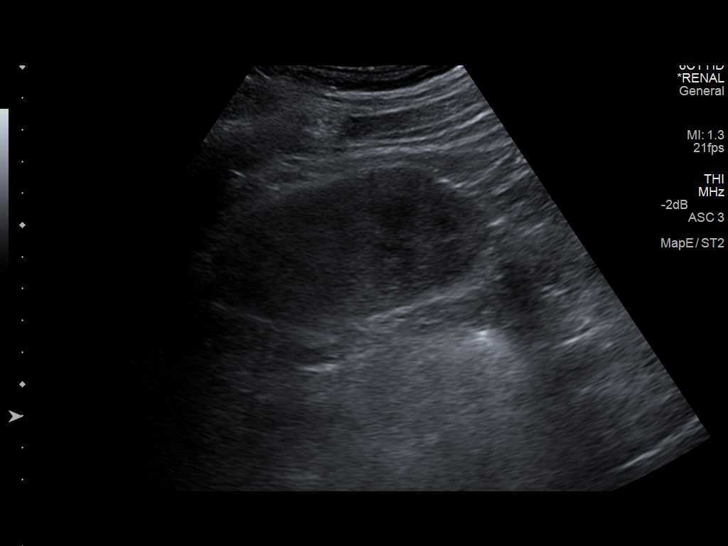
[im 5/28]
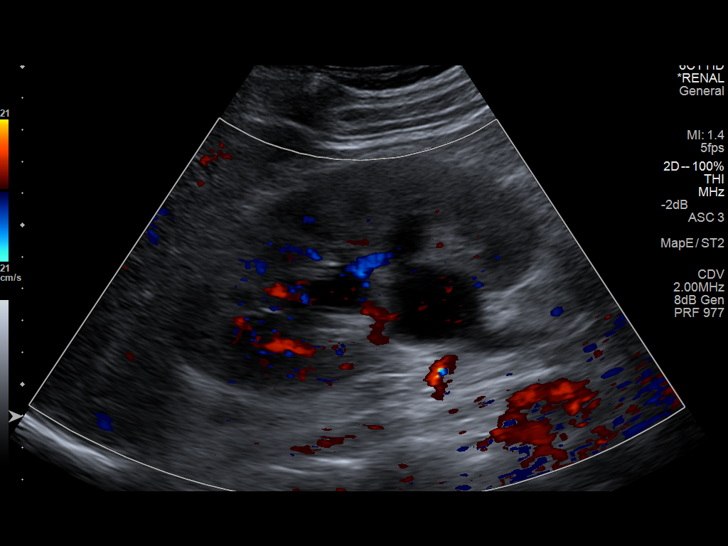
[im 7/28]
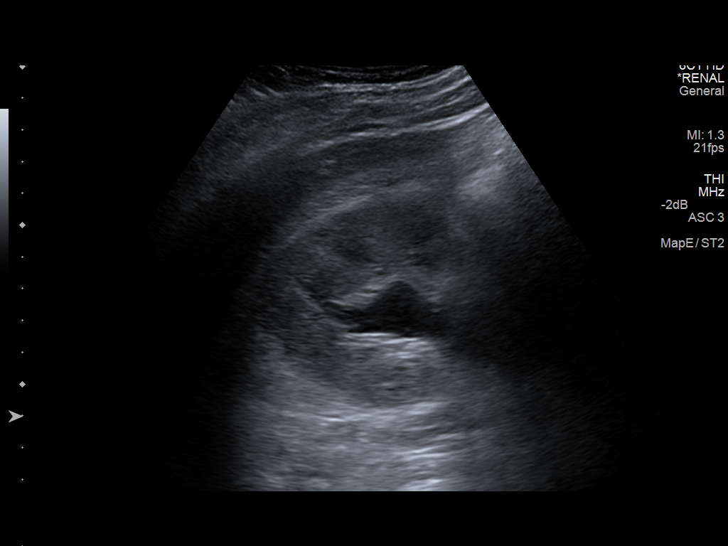
[im 10/28]
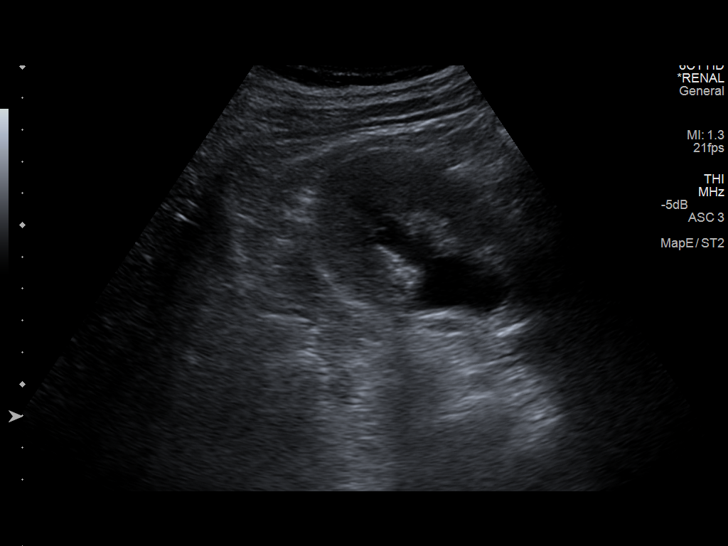
[im 11/28]
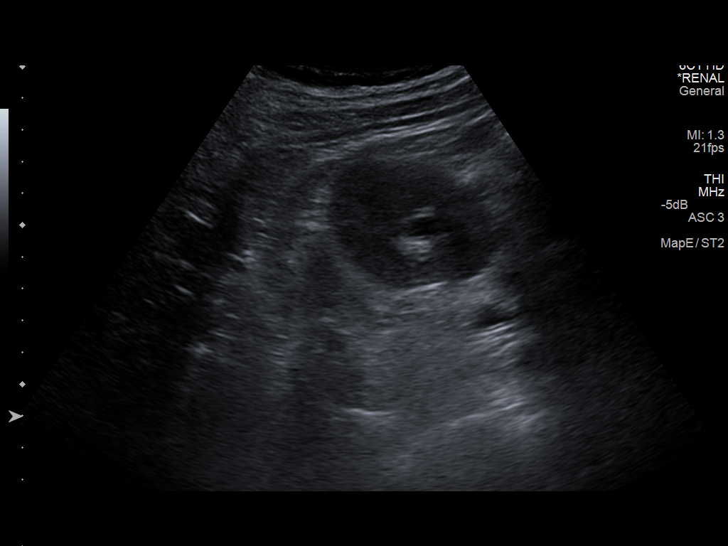
[im 13/28]
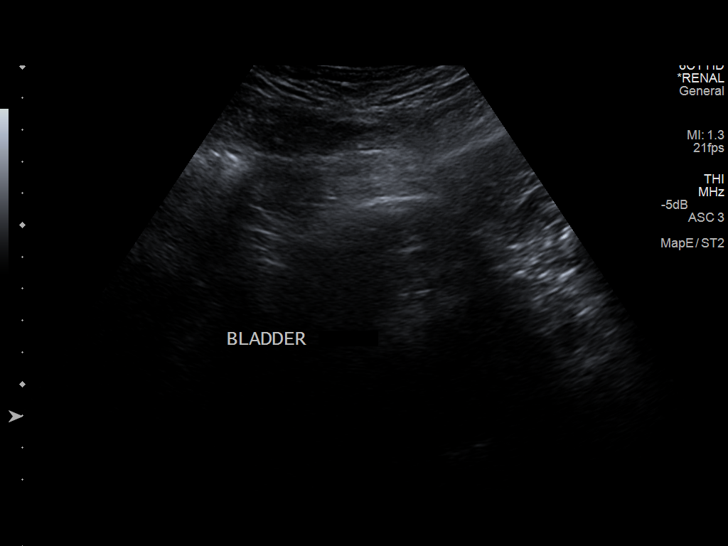
[im 15/28]
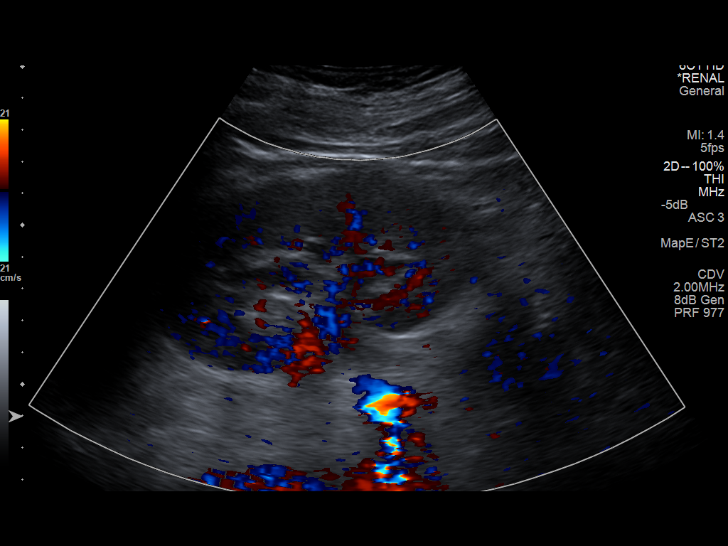
[im 17/28]
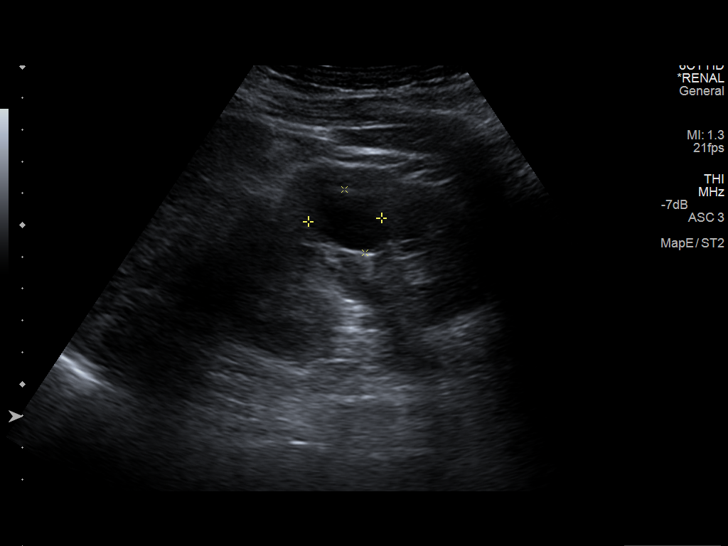
[im 19/28]
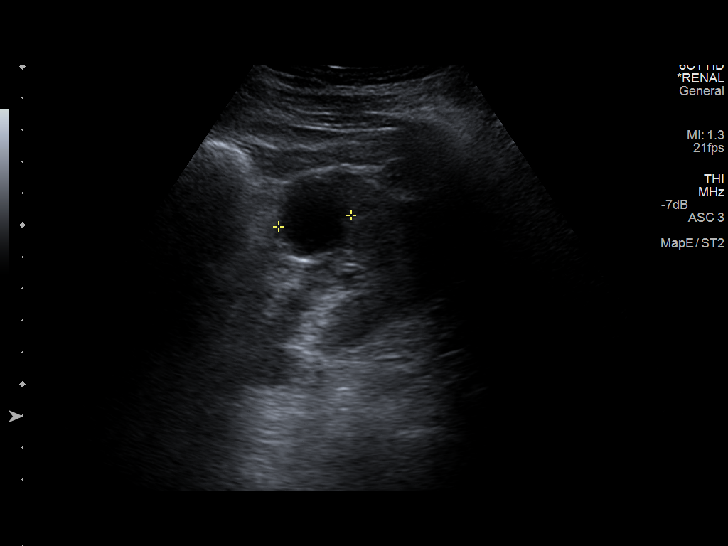
[im 21/28]
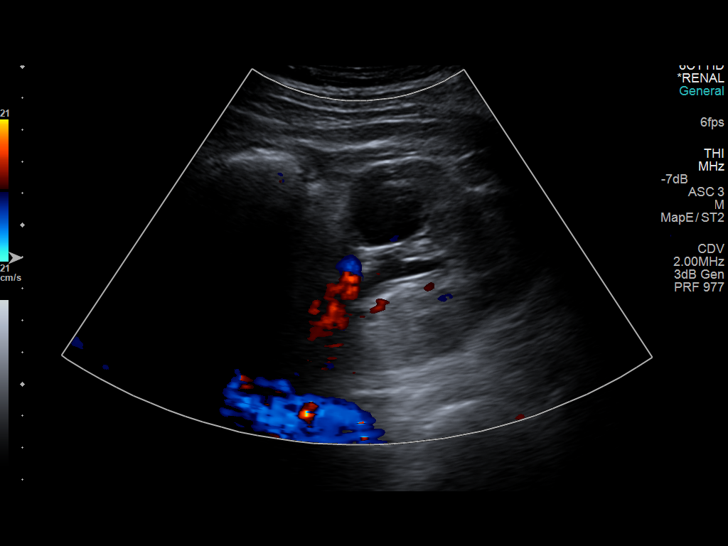
[im 23/28]
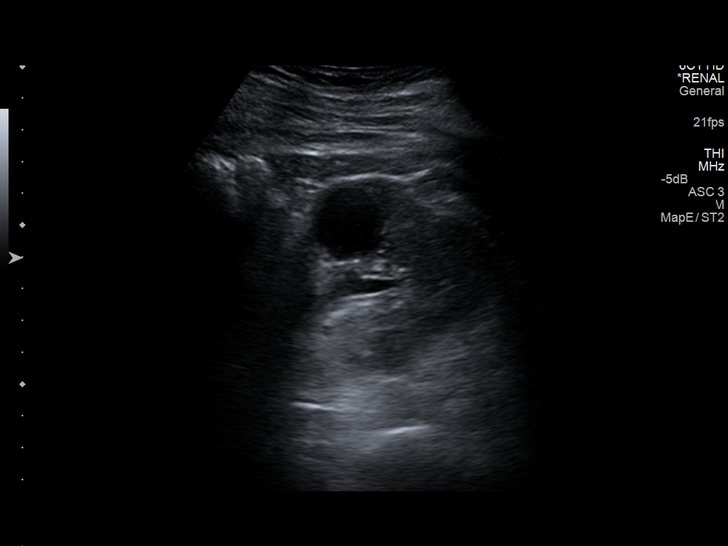
[im 25/28]
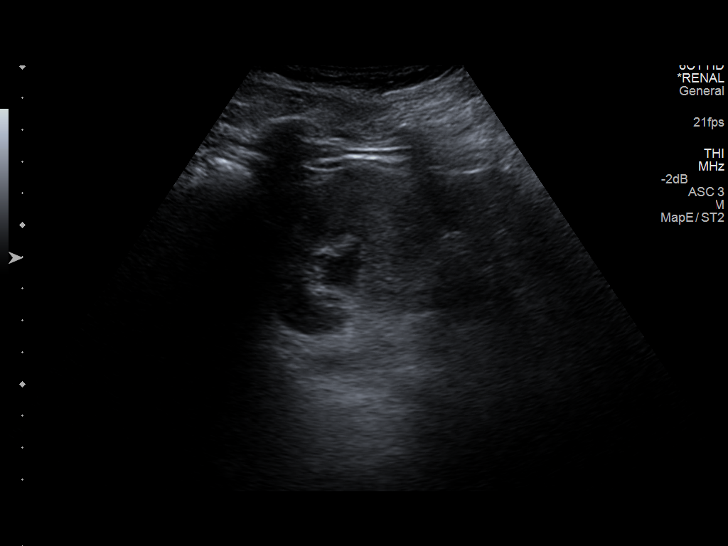
[im 28/28]
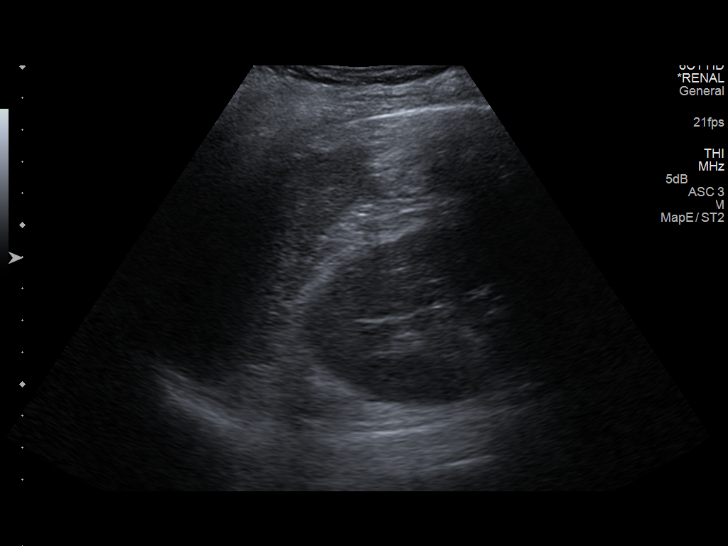

[14 of 25 positions shown; findings below may reference images not displayed]

FINDINGS: Right Kidney:

Length: 12.3 cm. Mild increased cortical echogenicity. Mild
hydronephrosis slightly improved. No focal mass.

Left Kidney:

Length: 10.8 cm. Minimal increased cortical echogenicity. Minimal
prominence of the left intrarenal collecting system with moderate
improvement compared to the previous exam. 2.3 cm cyst over the
upper pole.

Bladder:

Appears normal for degree of bladder distention.
IMPRESSION: Normal size kidneys with mild increased cortical echogenicity which
can be seen in medical renal disease. Mild right-sided
hydronephrosis and minimal prominence of the left intrarenal
collecting system with improvement bilaterally compared to the prior
exam.

2.3 cm left renal cyst.

## 2016-08-19 DIAGNOSIS — N186 End stage renal disease: Secondary | ICD-10-CM | POA: Diagnosis not present

## 2016-08-19 DIAGNOSIS — Z992 Dependence on renal dialysis: Secondary | ICD-10-CM | POA: Diagnosis not present

## 2016-08-19 DIAGNOSIS — E119 Type 2 diabetes mellitus without complications: Secondary | ICD-10-CM | POA: Diagnosis not present

## 2016-08-21 DIAGNOSIS — Z992 Dependence on renal dialysis: Secondary | ICD-10-CM | POA: Diagnosis not present

## 2016-08-21 DIAGNOSIS — N186 End stage renal disease: Secondary | ICD-10-CM | POA: Diagnosis not present

## 2016-08-23 DIAGNOSIS — N186 End stage renal disease: Secondary | ICD-10-CM | POA: Diagnosis not present

## 2016-08-23 DIAGNOSIS — Z992 Dependence on renal dialysis: Secondary | ICD-10-CM | POA: Diagnosis not present

## 2016-08-23 DIAGNOSIS — E119 Type 2 diabetes mellitus without complications: Secondary | ICD-10-CM | POA: Insufficient documentation

## 2016-08-23 DIAGNOSIS — E11319 Type 2 diabetes mellitus with unspecified diabetic retinopathy without macular edema: Secondary | ICD-10-CM | POA: Insufficient documentation

## 2016-08-23 DIAGNOSIS — D631 Anemia in chronic kidney disease: Secondary | ICD-10-CM

## 2016-08-23 DIAGNOSIS — N189 Chronic kidney disease, unspecified: Secondary | ICD-10-CM

## 2016-08-25 ENCOUNTER — Other Ambulatory Visit (INDEPENDENT_AMBULATORY_CARE_PROVIDER_SITE_OTHER): Payer: Medicare Other | Admitting: Ophthalmology

## 2016-08-26 DIAGNOSIS — Z992 Dependence on renal dialysis: Secondary | ICD-10-CM | POA: Diagnosis not present

## 2016-08-26 DIAGNOSIS — N186 End stage renal disease: Secondary | ICD-10-CM | POA: Diagnosis not present

## 2016-08-28 DIAGNOSIS — N186 End stage renal disease: Secondary | ICD-10-CM | POA: Diagnosis not present

## 2016-08-28 DIAGNOSIS — D509 Iron deficiency anemia, unspecified: Secondary | ICD-10-CM | POA: Diagnosis not present

## 2016-08-28 DIAGNOSIS — Z992 Dependence on renal dialysis: Secondary | ICD-10-CM | POA: Diagnosis not present

## 2016-08-29 ENCOUNTER — Encounter (HOSPITAL_COMMUNITY)
Admission: RE | Admit: 2016-08-29 | Discharge: 2016-08-29 | Disposition: A | Discharge status: Home / Self Care | Payer: Medicare Other | Source: Ambulatory Visit | Attending: Nephrology | Admitting: Nephrology

## 2016-08-29 DIAGNOSIS — D509 Iron deficiency anemia, unspecified: Secondary | ICD-10-CM | POA: Diagnosis not present

## 2016-08-29 DIAGNOSIS — N186 End stage renal disease: Secondary | ICD-10-CM | POA: Diagnosis not present

## 2016-08-29 LAB — POCT HEMOGLOBIN-HEMACUE: Hemoglobin: 8.4 g/dL — ABNORMAL LOW (ref 13.0–17.0)

## 2016-08-29 MED ORDER — EPOETIN ALFA 40000 UNIT/ML IJ SOLN
INTRAMUSCULAR | Status: AC
  Filled 2016-08-29: qty 1

## 2016-08-29 MED ORDER — EPOETIN ALFA 2000 UNIT/ML IJ SOLN
2000.0000 [IU] | Freq: Once | INTRAMUSCULAR | Status: AC
  Administered 2016-08-29: 2000 [IU] via SUBCUTANEOUS

## 2016-08-29 MED ORDER — EPOETIN ALFA 2000 UNIT/ML IJ SOLN
INTRAMUSCULAR | Status: AC
  Filled 2016-08-29: qty 2

## 2016-08-29 NOTE — Progress Notes (Signed)
Received order today to d/c procrit injection. Pt is now on dialysis and receives his procrit with dialysis. Order sent to be scanned to chart.

## 2016-08-29 NOTE — Progress Notes (Signed)
Results for Zachary [name], Zachary [name] (MRN [id number]) as of 08/29/2016 13:27  Ref. Range 08/29/2016 13:17  Hemoglobin Latest Ref Range: 13.0 - 17.0 g/dL 8.4 (L)

## 2016-08-30 DIAGNOSIS — Z992 Dependence on renal dialysis: Secondary | ICD-10-CM | POA: Diagnosis not present

## 2016-08-30 DIAGNOSIS — N186 End stage renal disease: Secondary | ICD-10-CM | POA: Diagnosis not present

## 2016-08-30 DIAGNOSIS — D509 Iron deficiency anemia, unspecified: Secondary | ICD-10-CM | POA: Diagnosis not present

## 2016-09-02 DIAGNOSIS — D509 Iron deficiency anemia, unspecified: Secondary | ICD-10-CM | POA: Diagnosis not present

## 2016-09-02 DIAGNOSIS — N186 End stage renal disease: Secondary | ICD-10-CM | POA: Diagnosis not present

## 2016-09-02 DIAGNOSIS — Z992 Dependence on renal dialysis: Secondary | ICD-10-CM | POA: Diagnosis not present

## 2016-09-04 DIAGNOSIS — D509 Iron deficiency anemia, unspecified: Secondary | ICD-10-CM | POA: Diagnosis not present

## 2016-09-04 DIAGNOSIS — Z992 Dependence on renal dialysis: Secondary | ICD-10-CM | POA: Diagnosis not present

## 2016-09-04 DIAGNOSIS — N186 End stage renal disease: Secondary | ICD-10-CM | POA: Diagnosis not present

## 2016-09-06 DIAGNOSIS — D509 Iron deficiency anemia, unspecified: Secondary | ICD-10-CM | POA: Diagnosis not present

## 2016-09-06 DIAGNOSIS — N186 End stage renal disease: Secondary | ICD-10-CM | POA: Diagnosis not present

## 2016-09-06 DIAGNOSIS — Z992 Dependence on renal dialysis: Secondary | ICD-10-CM | POA: Diagnosis not present

## 2016-09-09 DIAGNOSIS — D509 Iron deficiency anemia, unspecified: Secondary | ICD-10-CM | POA: Diagnosis not present

## 2016-09-09 DIAGNOSIS — Z992 Dependence on renal dialysis: Secondary | ICD-10-CM | POA: Diagnosis not present

## 2016-09-09 DIAGNOSIS — N186 End stage renal disease: Secondary | ICD-10-CM | POA: Diagnosis not present

## 2016-09-11 DIAGNOSIS — N186 End stage renal disease: Secondary | ICD-10-CM | POA: Diagnosis not present

## 2016-09-11 DIAGNOSIS — Z992 Dependence on renal dialysis: Secondary | ICD-10-CM | POA: Diagnosis not present

## 2016-09-11 DIAGNOSIS — D509 Iron deficiency anemia, unspecified: Secondary | ICD-10-CM | POA: Diagnosis not present

## 2016-09-12 ENCOUNTER — Encounter (HOSPITAL_COMMUNITY): Admission: RE | Admit: 2016-09-12 | Payer: Medicare Other | Source: Ambulatory Visit

## 2016-09-12 ENCOUNTER — Encounter (HOSPITAL_COMMUNITY): Payer: Medicare Other

## 2016-09-12 DIAGNOSIS — Z466 Encounter for fitting and adjustment of urinary device: Secondary | ICD-10-CM | POA: Diagnosis not present

## 2016-09-12 DIAGNOSIS — F79 Unspecified intellectual disabilities: Secondary | ICD-10-CM | POA: Diagnosis not present

## 2016-09-12 DIAGNOSIS — R339 Retention of urine, unspecified: Secondary | ICD-10-CM | POA: Diagnosis not present

## 2016-09-12 DIAGNOSIS — E1165 Type 2 diabetes mellitus with hyperglycemia: Secondary | ICD-10-CM | POA: Diagnosis not present

## 2016-09-12 DIAGNOSIS — N186 End stage renal disease: Secondary | ICD-10-CM | POA: Diagnosis not present

## 2016-09-12 DIAGNOSIS — N400 Enlarged prostate without lower urinary tract symptoms: Secondary | ICD-10-CM | POA: Diagnosis not present

## 2016-09-12 DIAGNOSIS — N184 Chronic kidney disease, stage 4 (severe): Secondary | ICD-10-CM | POA: Diagnosis not present

## 2016-09-12 DIAGNOSIS — R2681 Unsteadiness on feet: Secondary | ICD-10-CM | POA: Diagnosis not present

## 2016-09-12 DIAGNOSIS — E1122 Type 2 diabetes mellitus with diabetic chronic kidney disease: Secondary | ICD-10-CM | POA: Diagnosis not present

## 2016-09-12 DIAGNOSIS — I129 Hypertensive chronic kidney disease with stage 1 through stage 4 chronic kidney disease, or unspecified chronic kidney disease: Secondary | ICD-10-CM | POA: Diagnosis not present

## 2016-09-13 DIAGNOSIS — Z992 Dependence on renal dialysis: Secondary | ICD-10-CM | POA: Diagnosis not present

## 2016-09-13 DIAGNOSIS — D509 Iron deficiency anemia, unspecified: Secondary | ICD-10-CM | POA: Diagnosis not present

## 2016-09-13 DIAGNOSIS — N186 End stage renal disease: Secondary | ICD-10-CM | POA: Diagnosis not present

## 2016-09-14 DIAGNOSIS — Z466 Encounter for fitting and adjustment of urinary device: Secondary | ICD-10-CM | POA: Diagnosis not present

## 2016-09-14 DIAGNOSIS — R339 Retention of urine, unspecified: Secondary | ICD-10-CM | POA: Diagnosis not present

## 2016-09-14 DIAGNOSIS — E1165 Type 2 diabetes mellitus with hyperglycemia: Secondary | ICD-10-CM | POA: Diagnosis not present

## 2016-09-14 DIAGNOSIS — Z87440 Personal history of urinary (tract) infections: Secondary | ICD-10-CM | POA: Diagnosis not present

## 2016-09-14 DIAGNOSIS — N400 Enlarged prostate without lower urinary tract symptoms: Secondary | ICD-10-CM | POA: Diagnosis not present

## 2016-09-14 DIAGNOSIS — N184 Chronic kidney disease, stage 4 (severe): Secondary | ICD-10-CM | POA: Diagnosis not present

## 2016-09-14 DIAGNOSIS — I129 Hypertensive chronic kidney disease with stage 1 through stage 4 chronic kidney disease, or unspecified chronic kidney disease: Secondary | ICD-10-CM | POA: Diagnosis not present

## 2016-09-14 DIAGNOSIS — F79 Unspecified intellectual disabilities: Secondary | ICD-10-CM | POA: Diagnosis not present

## 2016-09-14 DIAGNOSIS — E1122 Type 2 diabetes mellitus with diabetic chronic kidney disease: Secondary | ICD-10-CM | POA: Diagnosis not present

## 2016-09-15 ENCOUNTER — Other Ambulatory Visit (INDEPENDENT_AMBULATORY_CARE_PROVIDER_SITE_OTHER): Payer: Medicare Other | Admitting: Ophthalmology

## 2016-09-15 DIAGNOSIS — N184 Chronic kidney disease, stage 4 (severe): Secondary | ICD-10-CM | POA: Diagnosis not present

## 2016-09-15 DIAGNOSIS — E11311 Type 2 diabetes mellitus with unspecified diabetic retinopathy with macular edema: Secondary | ICD-10-CM

## 2016-09-15 DIAGNOSIS — I129 Hypertensive chronic kidney disease with stage 1 through stage 4 chronic kidney disease, or unspecified chronic kidney disease: Secondary | ICD-10-CM | POA: Diagnosis not present

## 2016-09-15 DIAGNOSIS — N400 Enlarged prostate without lower urinary tract symptoms: Secondary | ICD-10-CM | POA: Diagnosis not present

## 2016-09-15 DIAGNOSIS — E1122 Type 2 diabetes mellitus with diabetic chronic kidney disease: Secondary | ICD-10-CM | POA: Diagnosis not present

## 2016-09-15 DIAGNOSIS — E113511 Type 2 diabetes mellitus with proliferative diabetic retinopathy with macular edema, right eye: Secondary | ICD-10-CM | POA: Diagnosis not present

## 2016-09-15 DIAGNOSIS — R339 Retention of urine, unspecified: Secondary | ICD-10-CM | POA: Diagnosis not present

## 2016-09-15 DIAGNOSIS — Z466 Encounter for fitting and adjustment of urinary device: Secondary | ICD-10-CM | POA: Diagnosis not present

## 2016-09-16 DIAGNOSIS — Z992 Dependence on renal dialysis: Secondary | ICD-10-CM | POA: Diagnosis not present

## 2016-09-16 DIAGNOSIS — N186 End stage renal disease: Secondary | ICD-10-CM | POA: Diagnosis not present

## 2016-09-16 DIAGNOSIS — D509 Iron deficiency anemia, unspecified: Secondary | ICD-10-CM | POA: Diagnosis not present

## 2016-09-18 ENCOUNTER — Other Ambulatory Visit (HOSPITAL_COMMUNITY)
Admission: RE | Admit: 2016-09-18 | Discharge: 2016-09-18 | Disposition: A | Discharge status: Home / Self Care | Payer: Medicare Other | Source: Other Acute Inpatient Hospital | Attending: Nephrology | Admitting: Nephrology

## 2016-09-18 DIAGNOSIS — E875 Hyperkalemia: Secondary | ICD-10-CM | POA: Insufficient documentation

## 2016-09-18 LAB — POTASSIUM: Potassium: 3.7 mmol/L (ref 3.5–5.1)

## 2016-09-19 DIAGNOSIS — I871 Compression of vein: Secondary | ICD-10-CM | POA: Diagnosis not present

## 2016-09-19 DIAGNOSIS — N186 End stage renal disease: Secondary | ICD-10-CM | POA: Diagnosis not present

## 2016-09-19 DIAGNOSIS — Z992 Dependence on renal dialysis: Secondary | ICD-10-CM | POA: Diagnosis not present

## 2016-09-19 DIAGNOSIS — T82858A Stenosis of vascular prosthetic devices, implants and grafts, initial encounter: Secondary | ICD-10-CM | POA: Diagnosis not present

## 2016-09-20 DIAGNOSIS — N186 End stage renal disease: Secondary | ICD-10-CM | POA: Diagnosis not present

## 2016-09-20 DIAGNOSIS — D509 Iron deficiency anemia, unspecified: Secondary | ICD-10-CM | POA: Diagnosis not present

## 2016-09-20 DIAGNOSIS — Z992 Dependence on renal dialysis: Secondary | ICD-10-CM | POA: Diagnosis not present

## 2016-09-23 DIAGNOSIS — Z992 Dependence on renal dialysis: Secondary | ICD-10-CM | POA: Diagnosis not present

## 2016-09-23 DIAGNOSIS — N186 End stage renal disease: Secondary | ICD-10-CM | POA: Diagnosis not present

## 2016-09-23 DIAGNOSIS — D509 Iron deficiency anemia, unspecified: Secondary | ICD-10-CM | POA: Diagnosis not present

## 2016-09-25 DIAGNOSIS — D509 Iron deficiency anemia, unspecified: Secondary | ICD-10-CM | POA: Diagnosis not present

## 2016-09-25 DIAGNOSIS — Z992 Dependence on renal dialysis: Secondary | ICD-10-CM | POA: Diagnosis not present

## 2016-09-25 DIAGNOSIS — N186 End stage renal disease: Secondary | ICD-10-CM | POA: Diagnosis not present

## 2016-09-27 DIAGNOSIS — D509 Iron deficiency anemia, unspecified: Secondary | ICD-10-CM | POA: Diagnosis not present

## 2016-09-27 DIAGNOSIS — E162 Hypoglycemia, unspecified: Secondary | ICD-10-CM | POA: Diagnosis not present

## 2016-09-27 DIAGNOSIS — D631 Anemia in chronic kidney disease: Secondary | ICD-10-CM | POA: Diagnosis not present

## 2016-09-27 DIAGNOSIS — Z23 Encounter for immunization: Secondary | ICD-10-CM | POA: Diagnosis not present

## 2016-09-27 DIAGNOSIS — Z992 Dependence on renal dialysis: Secondary | ICD-10-CM | POA: Diagnosis not present

## 2016-09-27 DIAGNOSIS — E11649 Type 2 diabetes mellitus with hypoglycemia without coma: Secondary | ICD-10-CM | POA: Diagnosis not present

## 2016-09-27 DIAGNOSIS — N186 End stage renal disease: Secondary | ICD-10-CM | POA: Diagnosis not present

## 2016-09-30 DIAGNOSIS — E162 Hypoglycemia, unspecified: Secondary | ICD-10-CM | POA: Diagnosis not present

## 2016-09-30 DIAGNOSIS — D631 Anemia in chronic kidney disease: Secondary | ICD-10-CM | POA: Diagnosis not present

## 2016-09-30 DIAGNOSIS — D509 Iron deficiency anemia, unspecified: Secondary | ICD-10-CM | POA: Diagnosis not present

## 2016-09-30 DIAGNOSIS — Z992 Dependence on renal dialysis: Secondary | ICD-10-CM | POA: Diagnosis not present

## 2016-09-30 DIAGNOSIS — N186 End stage renal disease: Secondary | ICD-10-CM | POA: Diagnosis not present

## 2016-09-30 DIAGNOSIS — Z23 Encounter for immunization: Secondary | ICD-10-CM | POA: Diagnosis not present

## 2016-10-01 DIAGNOSIS — N400 Enlarged prostate without lower urinary tract symptoms: Secondary | ICD-10-CM | POA: Diagnosis not present

## 2016-10-01 DIAGNOSIS — R339 Retention of urine, unspecified: Secondary | ICD-10-CM | POA: Diagnosis not present

## 2016-10-01 DIAGNOSIS — E1122 Type 2 diabetes mellitus with diabetic chronic kidney disease: Secondary | ICD-10-CM | POA: Diagnosis not present

## 2016-10-01 DIAGNOSIS — N184 Chronic kidney disease, stage 4 (severe): Secondary | ICD-10-CM | POA: Diagnosis not present

## 2016-10-01 DIAGNOSIS — Z466 Encounter for fitting and adjustment of urinary device: Secondary | ICD-10-CM | POA: Diagnosis not present

## 2016-10-01 DIAGNOSIS — I129 Hypertensive chronic kidney disease with stage 1 through stage 4 chronic kidney disease, or unspecified chronic kidney disease: Secondary | ICD-10-CM | POA: Diagnosis not present

## 2016-10-02 DIAGNOSIS — N186 End stage renal disease: Secondary | ICD-10-CM | POA: Diagnosis not present

## 2016-10-02 DIAGNOSIS — E162 Hypoglycemia, unspecified: Secondary | ICD-10-CM | POA: Diagnosis not present

## 2016-10-02 DIAGNOSIS — D631 Anemia in chronic kidney disease: Secondary | ICD-10-CM | POA: Diagnosis not present

## 2016-10-02 DIAGNOSIS — D509 Iron deficiency anemia, unspecified: Secondary | ICD-10-CM | POA: Diagnosis not present

## 2016-10-02 DIAGNOSIS — Z23 Encounter for immunization: Secondary | ICD-10-CM | POA: Diagnosis not present

## 2016-10-02 DIAGNOSIS — Z992 Dependence on renal dialysis: Secondary | ICD-10-CM | POA: Diagnosis not present

## 2016-10-03 ENCOUNTER — Other Ambulatory Visit (INDEPENDENT_AMBULATORY_CARE_PROVIDER_SITE_OTHER): Payer: Medicare Other | Admitting: Ophthalmology

## 2016-10-03 DIAGNOSIS — E113312 Type 2 diabetes mellitus with moderate nonproliferative diabetic retinopathy with macular edema, left eye: Secondary | ICD-10-CM

## 2016-10-03 DIAGNOSIS — E11311 Type 2 diabetes mellitus with unspecified diabetic retinopathy with macular edema: Secondary | ICD-10-CM

## 2016-10-04 DIAGNOSIS — N186 End stage renal disease: Secondary | ICD-10-CM | POA: Diagnosis not present

## 2016-10-04 DIAGNOSIS — D509 Iron deficiency anemia, unspecified: Secondary | ICD-10-CM | POA: Diagnosis not present

## 2016-10-04 DIAGNOSIS — D631 Anemia in chronic kidney disease: Secondary | ICD-10-CM | POA: Diagnosis not present

## 2016-10-04 DIAGNOSIS — Z23 Encounter for immunization: Secondary | ICD-10-CM | POA: Diagnosis not present

## 2016-10-04 DIAGNOSIS — Z992 Dependence on renal dialysis: Secondary | ICD-10-CM | POA: Diagnosis not present

## 2016-10-04 DIAGNOSIS — E162 Hypoglycemia, unspecified: Secondary | ICD-10-CM | POA: Diagnosis not present

## 2016-10-07 DIAGNOSIS — D509 Iron deficiency anemia, unspecified: Secondary | ICD-10-CM | POA: Diagnosis not present

## 2016-10-07 DIAGNOSIS — Z23 Encounter for immunization: Secondary | ICD-10-CM | POA: Diagnosis not present

## 2016-10-07 DIAGNOSIS — N186 End stage renal disease: Secondary | ICD-10-CM | POA: Diagnosis not present

## 2016-10-07 DIAGNOSIS — E162 Hypoglycemia, unspecified: Secondary | ICD-10-CM | POA: Diagnosis not present

## 2016-10-07 DIAGNOSIS — D631 Anemia in chronic kidney disease: Secondary | ICD-10-CM | POA: Diagnosis not present

## 2016-10-07 DIAGNOSIS — Z992 Dependence on renal dialysis: Secondary | ICD-10-CM | POA: Diagnosis not present

## 2016-10-09 ENCOUNTER — Other Ambulatory Visit (HOSPITAL_COMMUNITY)
Admission: RE | Admit: 2016-10-09 | Discharge: 2016-10-09 | Disposition: A | Discharge status: Home / Self Care | Payer: Medicare Other | Source: Other Acute Inpatient Hospital | Attending: Nephrology | Admitting: Nephrology

## 2016-10-09 DIAGNOSIS — N390 Urinary tract infection, site not specified: Secondary | ICD-10-CM | POA: Insufficient documentation

## 2016-10-09 DIAGNOSIS — N184 Chronic kidney disease, stage 4 (severe): Secondary | ICD-10-CM | POA: Diagnosis not present

## 2016-10-09 DIAGNOSIS — E162 Hypoglycemia, unspecified: Secondary | ICD-10-CM | POA: Diagnosis not present

## 2016-10-09 DIAGNOSIS — Z992 Dependence on renal dialysis: Secondary | ICD-10-CM | POA: Diagnosis not present

## 2016-10-09 DIAGNOSIS — R339 Retention of urine, unspecified: Secondary | ICD-10-CM | POA: Diagnosis not present

## 2016-10-09 DIAGNOSIS — D631 Anemia in chronic kidney disease: Secondary | ICD-10-CM | POA: Diagnosis not present

## 2016-10-09 DIAGNOSIS — I129 Hypertensive chronic kidney disease with stage 1 through stage 4 chronic kidney disease, or unspecified chronic kidney disease: Secondary | ICD-10-CM | POA: Diagnosis not present

## 2016-10-09 DIAGNOSIS — N400 Enlarged prostate without lower urinary tract symptoms: Secondary | ICD-10-CM | POA: Diagnosis not present

## 2016-10-09 DIAGNOSIS — Z466 Encounter for fitting and adjustment of urinary device: Secondary | ICD-10-CM | POA: Diagnosis not present

## 2016-10-09 DIAGNOSIS — D509 Iron deficiency anemia, unspecified: Secondary | ICD-10-CM | POA: Diagnosis not present

## 2016-10-09 DIAGNOSIS — N186 End stage renal disease: Secondary | ICD-10-CM | POA: Diagnosis not present

## 2016-10-09 DIAGNOSIS — E1122 Type 2 diabetes mellitus with diabetic chronic kidney disease: Secondary | ICD-10-CM | POA: Diagnosis not present

## 2016-10-09 DIAGNOSIS — Z23 Encounter for immunization: Secondary | ICD-10-CM | POA: Diagnosis not present

## 2016-10-09 LAB — URINALYSIS, ROUTINE W REFLEX MICROSCOPIC
Bilirubin Urine: NEGATIVE
Glucose, UA: NEGATIVE mg/dL
Nitrite: NEGATIVE
Protein, ur: 100 mg/dL — AB
Specific Gravity, Urine: 1.025 (ref 1.005–1.030)
pH: 6 (ref 5.0–8.0)

## 2016-10-09 LAB — URINALYSIS, MICROSCOPIC (REFLEX): Squamous Epithelial / LPF: NONE SEEN

## 2016-10-11 DIAGNOSIS — D631 Anemia in chronic kidney disease: Secondary | ICD-10-CM | POA: Diagnosis not present

## 2016-10-11 DIAGNOSIS — Z992 Dependence on renal dialysis: Secondary | ICD-10-CM | POA: Diagnosis not present

## 2016-10-11 DIAGNOSIS — E162 Hypoglycemia, unspecified: Secondary | ICD-10-CM | POA: Diagnosis not present

## 2016-10-11 DIAGNOSIS — N186 End stage renal disease: Secondary | ICD-10-CM | POA: Diagnosis not present

## 2016-10-11 DIAGNOSIS — D509 Iron deficiency anemia, unspecified: Secondary | ICD-10-CM | POA: Diagnosis not present

## 2016-10-11 DIAGNOSIS — Z23 Encounter for immunization: Secondary | ICD-10-CM | POA: Diagnosis not present

## 2016-10-12 LAB — URINE CULTURE: Culture: >=100000 — AB

## 2016-10-14 DIAGNOSIS — D509 Iron deficiency anemia, unspecified: Secondary | ICD-10-CM | POA: Diagnosis not present

## 2016-10-14 DIAGNOSIS — Z23 Encounter for immunization: Secondary | ICD-10-CM | POA: Diagnosis not present

## 2016-10-14 DIAGNOSIS — F79 Unspecified intellectual disabilities: Secondary | ICD-10-CM | POA: Diagnosis not present

## 2016-10-14 DIAGNOSIS — Z992 Dependence on renal dialysis: Secondary | ICD-10-CM | POA: Diagnosis not present

## 2016-10-14 DIAGNOSIS — R2681 Unsteadiness on feet: Secondary | ICD-10-CM | POA: Diagnosis not present

## 2016-10-14 DIAGNOSIS — E162 Hypoglycemia, unspecified: Secondary | ICD-10-CM | POA: Diagnosis not present

## 2016-10-14 DIAGNOSIS — D631 Anemia in chronic kidney disease: Secondary | ICD-10-CM | POA: Diagnosis not present

## 2016-10-14 DIAGNOSIS — N186 End stage renal disease: Secondary | ICD-10-CM | POA: Diagnosis not present

## 2016-10-14 DIAGNOSIS — E1165 Type 2 diabetes mellitus with hyperglycemia: Secondary | ICD-10-CM | POA: Diagnosis not present

## 2016-10-16 DIAGNOSIS — E162 Hypoglycemia, unspecified: Secondary | ICD-10-CM | POA: Diagnosis not present

## 2016-10-16 DIAGNOSIS — Z992 Dependence on renal dialysis: Secondary | ICD-10-CM | POA: Diagnosis not present

## 2016-10-16 DIAGNOSIS — D509 Iron deficiency anemia, unspecified: Secondary | ICD-10-CM | POA: Diagnosis not present

## 2016-10-16 DIAGNOSIS — N186 End stage renal disease: Secondary | ICD-10-CM | POA: Diagnosis not present

## 2016-10-16 DIAGNOSIS — D631 Anemia in chronic kidney disease: Secondary | ICD-10-CM | POA: Diagnosis not present

## 2016-10-16 DIAGNOSIS — Z23 Encounter for immunization: Secondary | ICD-10-CM | POA: Diagnosis not present

## 2016-10-18 DIAGNOSIS — D631 Anemia in chronic kidney disease: Secondary | ICD-10-CM | POA: Diagnosis not present

## 2016-10-18 DIAGNOSIS — D509 Iron deficiency anemia, unspecified: Secondary | ICD-10-CM | POA: Diagnosis not present

## 2016-10-18 DIAGNOSIS — N186 End stage renal disease: Secondary | ICD-10-CM | POA: Diagnosis not present

## 2016-10-18 DIAGNOSIS — Z23 Encounter for immunization: Secondary | ICD-10-CM | POA: Diagnosis not present

## 2016-10-18 DIAGNOSIS — Z992 Dependence on renal dialysis: Secondary | ICD-10-CM | POA: Diagnosis not present

## 2016-10-18 DIAGNOSIS — E162 Hypoglycemia, unspecified: Secondary | ICD-10-CM | POA: Diagnosis not present

## 2016-10-21 DIAGNOSIS — Z992 Dependence on renal dialysis: Secondary | ICD-10-CM | POA: Diagnosis not present

## 2016-10-21 DIAGNOSIS — Z23 Encounter for immunization: Secondary | ICD-10-CM | POA: Diagnosis not present

## 2016-10-21 DIAGNOSIS — D509 Iron deficiency anemia, unspecified: Secondary | ICD-10-CM | POA: Diagnosis not present

## 2016-10-21 DIAGNOSIS — E162 Hypoglycemia, unspecified: Secondary | ICD-10-CM | POA: Diagnosis not present

## 2016-10-21 DIAGNOSIS — D631 Anemia in chronic kidney disease: Secondary | ICD-10-CM | POA: Diagnosis not present

## 2016-10-21 DIAGNOSIS — N186 End stage renal disease: Secondary | ICD-10-CM | POA: Diagnosis not present

## 2016-10-22 ENCOUNTER — Ambulatory Visit (INDEPENDENT_AMBULATORY_CARE_PROVIDER_SITE_OTHER): Payer: Medicare Other | Admitting: Urology

## 2016-10-22 DIAGNOSIS — R330 Drug induced retention of urine: Secondary | ICD-10-CM

## 2016-10-23 DIAGNOSIS — D631 Anemia in chronic kidney disease: Secondary | ICD-10-CM | POA: Diagnosis not present

## 2016-10-23 DIAGNOSIS — Z23 Encounter for immunization: Secondary | ICD-10-CM | POA: Diagnosis not present

## 2016-10-23 DIAGNOSIS — E162 Hypoglycemia, unspecified: Secondary | ICD-10-CM | POA: Diagnosis not present

## 2016-10-23 DIAGNOSIS — D509 Iron deficiency anemia, unspecified: Secondary | ICD-10-CM | POA: Diagnosis not present

## 2016-10-23 DIAGNOSIS — N186 End stage renal disease: Secondary | ICD-10-CM | POA: Diagnosis not present

## 2016-10-23 DIAGNOSIS — Z992 Dependence on renal dialysis: Secondary | ICD-10-CM | POA: Diagnosis not present

## 2016-10-25 DIAGNOSIS — Z23 Encounter for immunization: Secondary | ICD-10-CM | POA: Diagnosis not present

## 2016-10-25 DIAGNOSIS — E162 Hypoglycemia, unspecified: Secondary | ICD-10-CM | POA: Diagnosis not present

## 2016-10-25 DIAGNOSIS — N186 End stage renal disease: Secondary | ICD-10-CM | POA: Diagnosis not present

## 2016-10-25 DIAGNOSIS — Z992 Dependence on renal dialysis: Secondary | ICD-10-CM | POA: Diagnosis not present

## 2016-10-25 DIAGNOSIS — D631 Anemia in chronic kidney disease: Secondary | ICD-10-CM | POA: Diagnosis not present

## 2016-10-25 DIAGNOSIS — D509 Iron deficiency anemia, unspecified: Secondary | ICD-10-CM | POA: Diagnosis not present

## 2016-10-26 DIAGNOSIS — N186 End stage renal disease: Secondary | ICD-10-CM | POA: Diagnosis not present

## 2016-10-26 DIAGNOSIS — Z992 Dependence on renal dialysis: Secondary | ICD-10-CM | POA: Diagnosis not present

## 2016-10-28 DIAGNOSIS — D509 Iron deficiency anemia, unspecified: Secondary | ICD-10-CM | POA: Diagnosis not present

## 2016-10-28 DIAGNOSIS — Z23 Encounter for immunization: Secondary | ICD-10-CM | POA: Diagnosis not present

## 2016-10-28 DIAGNOSIS — D631 Anemia in chronic kidney disease: Secondary | ICD-10-CM | POA: Diagnosis not present

## 2016-10-28 DIAGNOSIS — N2581 Secondary hyperparathyroidism of renal origin: Secondary | ICD-10-CM | POA: Diagnosis not present

## 2016-10-28 DIAGNOSIS — Z992 Dependence on renal dialysis: Secondary | ICD-10-CM | POA: Diagnosis not present

## 2016-10-28 DIAGNOSIS — N186 End stage renal disease: Secondary | ICD-10-CM | POA: Diagnosis not present

## 2016-10-29 ENCOUNTER — Ambulatory Visit: Payer: Medicare Other | Admitting: Urology

## 2016-10-30 DIAGNOSIS — Z992 Dependence on renal dialysis: Secondary | ICD-10-CM | POA: Diagnosis not present

## 2016-10-30 DIAGNOSIS — D509 Iron deficiency anemia, unspecified: Secondary | ICD-10-CM | POA: Diagnosis not present

## 2016-10-30 DIAGNOSIS — D631 Anemia in chronic kidney disease: Secondary | ICD-10-CM | POA: Diagnosis not present

## 2016-10-30 DIAGNOSIS — N186 End stage renal disease: Secondary | ICD-10-CM | POA: Diagnosis not present

## 2016-10-30 DIAGNOSIS — N2581 Secondary hyperparathyroidism of renal origin: Secondary | ICD-10-CM | POA: Diagnosis not present

## 2016-10-30 DIAGNOSIS — Z23 Encounter for immunization: Secondary | ICD-10-CM | POA: Diagnosis not present

## 2016-10-31 ENCOUNTER — Other Ambulatory Visit (INDEPENDENT_AMBULATORY_CARE_PROVIDER_SITE_OTHER): Payer: Medicare Other | Admitting: Ophthalmology

## 2016-10-31 DIAGNOSIS — E11311 Type 2 diabetes mellitus with unspecified diabetic retinopathy with macular edema: Secondary | ICD-10-CM

## 2016-10-31 DIAGNOSIS — E113511 Type 2 diabetes mellitus with proliferative diabetic retinopathy with macular edema, right eye: Secondary | ICD-10-CM | POA: Diagnosis not present

## 2016-11-01 DIAGNOSIS — Z23 Encounter for immunization: Secondary | ICD-10-CM | POA: Diagnosis not present

## 2016-11-01 DIAGNOSIS — N186 End stage renal disease: Secondary | ICD-10-CM | POA: Diagnosis not present

## 2016-11-01 DIAGNOSIS — D631 Anemia in chronic kidney disease: Secondary | ICD-10-CM | POA: Diagnosis not present

## 2016-11-01 DIAGNOSIS — D509 Iron deficiency anemia, unspecified: Secondary | ICD-10-CM | POA: Diagnosis not present

## 2016-11-01 DIAGNOSIS — Z992 Dependence on renal dialysis: Secondary | ICD-10-CM | POA: Diagnosis not present

## 2016-11-01 DIAGNOSIS — N2581 Secondary hyperparathyroidism of renal origin: Secondary | ICD-10-CM | POA: Diagnosis not present

## 2016-11-04 DIAGNOSIS — N2581 Secondary hyperparathyroidism of renal origin: Secondary | ICD-10-CM | POA: Diagnosis not present

## 2016-11-04 DIAGNOSIS — N186 End stage renal disease: Secondary | ICD-10-CM | POA: Diagnosis not present

## 2016-11-04 DIAGNOSIS — D631 Anemia in chronic kidney disease: Secondary | ICD-10-CM | POA: Diagnosis not present

## 2016-11-04 DIAGNOSIS — E119 Type 2 diabetes mellitus without complications: Secondary | ICD-10-CM | POA: Diagnosis not present

## 2016-11-04 DIAGNOSIS — Z23 Encounter for immunization: Secondary | ICD-10-CM | POA: Diagnosis not present

## 2016-11-04 DIAGNOSIS — Z992 Dependence on renal dialysis: Secondary | ICD-10-CM | POA: Diagnosis not present

## 2016-11-04 DIAGNOSIS — D509 Iron deficiency anemia, unspecified: Secondary | ICD-10-CM | POA: Diagnosis not present

## 2016-11-06 ENCOUNTER — Other Ambulatory Visit (INDEPENDENT_AMBULATORY_CARE_PROVIDER_SITE_OTHER): Payer: Medicare Other | Admitting: Ophthalmology

## 2016-11-06 DIAGNOSIS — E11311 Type 2 diabetes mellitus with unspecified diabetic retinopathy with macular edema: Secondary | ICD-10-CM

## 2016-11-06 DIAGNOSIS — E113512 Type 2 diabetes mellitus with proliferative diabetic retinopathy with macular edema, left eye: Secondary | ICD-10-CM

## 2016-11-07 DIAGNOSIS — Z23 Encounter for immunization: Secondary | ICD-10-CM | POA: Diagnosis not present

## 2016-11-07 DIAGNOSIS — N186 End stage renal disease: Secondary | ICD-10-CM | POA: Diagnosis not present

## 2016-11-07 DIAGNOSIS — Z992 Dependence on renal dialysis: Secondary | ICD-10-CM | POA: Diagnosis not present

## 2016-11-07 DIAGNOSIS — D509 Iron deficiency anemia, unspecified: Secondary | ICD-10-CM | POA: Diagnosis not present

## 2016-11-07 DIAGNOSIS — D631 Anemia in chronic kidney disease: Secondary | ICD-10-CM | POA: Diagnosis not present

## 2016-11-07 DIAGNOSIS — N2581 Secondary hyperparathyroidism of renal origin: Secondary | ICD-10-CM | POA: Diagnosis not present

## 2016-11-08 DIAGNOSIS — N186 End stage renal disease: Secondary | ICD-10-CM | POA: Diagnosis not present

## 2016-11-08 DIAGNOSIS — N2581 Secondary hyperparathyroidism of renal origin: Secondary | ICD-10-CM | POA: Diagnosis not present

## 2016-11-08 DIAGNOSIS — D631 Anemia in chronic kidney disease: Secondary | ICD-10-CM | POA: Diagnosis not present

## 2016-11-08 DIAGNOSIS — Z992 Dependence on renal dialysis: Secondary | ICD-10-CM | POA: Diagnosis not present

## 2016-11-08 DIAGNOSIS — D509 Iron deficiency anemia, unspecified: Secondary | ICD-10-CM | POA: Diagnosis not present

## 2016-11-08 DIAGNOSIS — Z23 Encounter for immunization: Secondary | ICD-10-CM | POA: Diagnosis not present

## 2016-11-10 DIAGNOSIS — N400 Enlarged prostate without lower urinary tract symptoms: Secondary | ICD-10-CM | POA: Diagnosis not present

## 2016-11-10 DIAGNOSIS — E1122 Type 2 diabetes mellitus with diabetic chronic kidney disease: Secondary | ICD-10-CM | POA: Diagnosis not present

## 2016-11-10 DIAGNOSIS — N184 Chronic kidney disease, stage 4 (severe): Secondary | ICD-10-CM | POA: Diagnosis not present

## 2016-11-10 DIAGNOSIS — Z466 Encounter for fitting and adjustment of urinary device: Secondary | ICD-10-CM | POA: Diagnosis not present

## 2016-11-10 DIAGNOSIS — R339 Retention of urine, unspecified: Secondary | ICD-10-CM | POA: Diagnosis not present

## 2016-11-10 DIAGNOSIS — I129 Hypertensive chronic kidney disease with stage 1 through stage 4 chronic kidney disease, or unspecified chronic kidney disease: Secondary | ICD-10-CM | POA: Diagnosis not present

## 2016-11-11 DIAGNOSIS — N2581 Secondary hyperparathyroidism of renal origin: Secondary | ICD-10-CM | POA: Diagnosis not present

## 2016-11-11 DIAGNOSIS — D631 Anemia in chronic kidney disease: Secondary | ICD-10-CM | POA: Diagnosis not present

## 2016-11-11 DIAGNOSIS — N186 End stage renal disease: Secondary | ICD-10-CM | POA: Diagnosis not present

## 2016-11-11 DIAGNOSIS — D509 Iron deficiency anemia, unspecified: Secondary | ICD-10-CM | POA: Diagnosis not present

## 2016-11-11 DIAGNOSIS — Z23 Encounter for immunization: Secondary | ICD-10-CM | POA: Diagnosis not present

## 2016-11-11 DIAGNOSIS — Z992 Dependence on renal dialysis: Secondary | ICD-10-CM | POA: Diagnosis not present

## 2016-11-13 DIAGNOSIS — Z992 Dependence on renal dialysis: Secondary | ICD-10-CM | POA: Diagnosis not present

## 2016-11-13 DIAGNOSIS — D509 Iron deficiency anemia, unspecified: Secondary | ICD-10-CM | POA: Diagnosis not present

## 2016-11-13 DIAGNOSIS — Z23 Encounter for immunization: Secondary | ICD-10-CM | POA: Diagnosis not present

## 2016-11-13 DIAGNOSIS — D631 Anemia in chronic kidney disease: Secondary | ICD-10-CM | POA: Diagnosis not present

## 2016-11-13 DIAGNOSIS — N186 End stage renal disease: Secondary | ICD-10-CM | POA: Diagnosis not present

## 2016-11-13 DIAGNOSIS — N2581 Secondary hyperparathyroidism of renal origin: Secondary | ICD-10-CM | POA: Diagnosis not present

## 2016-11-15 DIAGNOSIS — Z992 Dependence on renal dialysis: Secondary | ICD-10-CM | POA: Diagnosis not present

## 2016-11-15 DIAGNOSIS — N2581 Secondary hyperparathyroidism of renal origin: Secondary | ICD-10-CM | POA: Diagnosis not present

## 2016-11-15 DIAGNOSIS — N186 End stage renal disease: Secondary | ICD-10-CM | POA: Diagnosis not present

## 2016-11-15 DIAGNOSIS — D631 Anemia in chronic kidney disease: Secondary | ICD-10-CM | POA: Diagnosis not present

## 2016-11-15 DIAGNOSIS — Z23 Encounter for immunization: Secondary | ICD-10-CM | POA: Diagnosis not present

## 2016-11-15 DIAGNOSIS — D509 Iron deficiency anemia, unspecified: Secondary | ICD-10-CM | POA: Diagnosis not present

## 2016-11-18 DIAGNOSIS — Z992 Dependence on renal dialysis: Secondary | ICD-10-CM | POA: Diagnosis not present

## 2016-11-18 DIAGNOSIS — N186 End stage renal disease: Secondary | ICD-10-CM | POA: Diagnosis not present

## 2016-11-18 DIAGNOSIS — Z23 Encounter for immunization: Secondary | ICD-10-CM | POA: Diagnosis not present

## 2016-11-18 DIAGNOSIS — N2581 Secondary hyperparathyroidism of renal origin: Secondary | ICD-10-CM | POA: Diagnosis not present

## 2016-11-18 DIAGNOSIS — D631 Anemia in chronic kidney disease: Secondary | ICD-10-CM | POA: Diagnosis not present

## 2016-11-18 DIAGNOSIS — D509 Iron deficiency anemia, unspecified: Secondary | ICD-10-CM | POA: Diagnosis not present

## 2016-11-19 ENCOUNTER — Ambulatory Visit (INDEPENDENT_AMBULATORY_CARE_PROVIDER_SITE_OTHER): Payer: Medicare Other | Admitting: Urology

## 2016-11-19 DIAGNOSIS — R330 Drug induced retention of urine: Secondary | ICD-10-CM

## 2016-11-20 ENCOUNTER — Inpatient Hospital Stay (HOSPITAL_COMMUNITY)
Admission: EM | Admit: 2016-11-20 | Discharge: 2016-11-26 | DRG: 480 | Disposition: A | Discharge status: Skilled Nursing Facility | Payer: Medicare Other | Attending: Internal Medicine | Admitting: Internal Medicine

## 2016-11-20 ENCOUNTER — Other Ambulatory Visit: Payer: Self-pay

## 2016-11-20 ENCOUNTER — Emergency Department (HOSPITAL_COMMUNITY): Payer: Medicare Other

## 2016-11-20 ENCOUNTER — Inpatient Hospital Stay (HOSPITAL_COMMUNITY): Payer: Medicare Other

## 2016-11-20 ENCOUNTER — Encounter (HOSPITAL_COMMUNITY): Payer: Self-pay

## 2016-11-20 DIAGNOSIS — S72464A Nondisplaced supracondylar fracture with intracondylar extension of lower end of right femur, initial encounter for closed fracture: Secondary | ICD-10-CM | POA: Diagnosis not present

## 2016-11-20 DIAGNOSIS — K219 Gastro-esophageal reflux disease without esophagitis: Secondary | ICD-10-CM | POA: Diagnosis present

## 2016-11-20 DIAGNOSIS — R0602 Shortness of breath: Secondary | ICD-10-CM

## 2016-11-20 DIAGNOSIS — R404 Transient alteration of awareness: Secondary | ICD-10-CM | POA: Diagnosis not present

## 2016-11-20 DIAGNOSIS — R262 Difficulty in walking, not elsewhere classified: Secondary | ICD-10-CM | POA: Diagnosis not present

## 2016-11-20 DIAGNOSIS — F79 Unspecified intellectual disabilities: Secondary | ICD-10-CM

## 2016-11-20 DIAGNOSIS — N186 End stage renal disease: Secondary | ICD-10-CM | POA: Diagnosis present

## 2016-11-20 DIAGNOSIS — N401 Enlarged prostate with lower urinary tract symptoms: Secondary | ICD-10-CM | POA: Diagnosis not present

## 2016-11-20 DIAGNOSIS — S72421A Displaced fracture of lateral condyle of right femur, initial encounter for closed fracture: Secondary | ICD-10-CM | POA: Diagnosis not present

## 2016-11-20 DIAGNOSIS — G894 Chronic pain syndrome: Secondary | ICD-10-CM | POA: Diagnosis not present

## 2016-11-20 DIAGNOSIS — E875 Hyperkalemia: Secondary | ICD-10-CM | POA: Diagnosis not present

## 2016-11-20 DIAGNOSIS — W010XXA Fall on same level from slipping, tripping and stumbling without subsequent striking against object, initial encounter: Secondary | ICD-10-CM | POA: Diagnosis present

## 2016-11-20 DIAGNOSIS — E1122 Type 2 diabetes mellitus with diabetic chronic kidney disease: Secondary | ICD-10-CM | POA: Diagnosis not present

## 2016-11-20 DIAGNOSIS — R338 Other retention of urine: Secondary | ICD-10-CM | POA: Diagnosis present

## 2016-11-20 DIAGNOSIS — N390 Urinary tract infection, site not specified: Secondary | ICD-10-CM | POA: Diagnosis not present

## 2016-11-20 DIAGNOSIS — Z992 Dependence on renal dialysis: Secondary | ICD-10-CM | POA: Diagnosis not present

## 2016-11-20 DIAGNOSIS — Y92128 Other place in nursing home as the place of occurrence of the external cause: Secondary | ICD-10-CM | POA: Diagnosis not present

## 2016-11-20 DIAGNOSIS — D631 Anemia in chronic kidney disease: Secondary | ICD-10-CM | POA: Diagnosis not present

## 2016-11-20 DIAGNOSIS — N3289 Other specified disorders of bladder: Secondary | ICD-10-CM | POA: Diagnosis not present

## 2016-11-20 DIAGNOSIS — S72491A Other fracture of lower end of right femur, initial encounter for closed fracture: Secondary | ICD-10-CM | POA: Diagnosis present

## 2016-11-20 DIAGNOSIS — Z9181 History of falling: Secondary | ICD-10-CM | POA: Diagnosis not present

## 2016-11-20 DIAGNOSIS — Z09 Encounter for follow-up examination after completed treatment for conditions other than malignant neoplasm: Secondary | ICD-10-CM

## 2016-11-20 DIAGNOSIS — F70 Mild intellectual disabilities: Secondary | ICD-10-CM | POA: Diagnosis present

## 2016-11-20 DIAGNOSIS — S72401A Unspecified fracture of lower end of right femur, initial encounter for closed fracture: Secondary | ICD-10-CM | POA: Diagnosis not present

## 2016-11-20 DIAGNOSIS — M25551 Pain in right hip: Secondary | ICD-10-CM | POA: Diagnosis not present

## 2016-11-20 DIAGNOSIS — E782 Mixed hyperlipidemia: Secondary | ICD-10-CM | POA: Diagnosis not present

## 2016-11-20 DIAGNOSIS — D509 Iron deficiency anemia, unspecified: Secondary | ICD-10-CM | POA: Diagnosis not present

## 2016-11-20 DIAGNOSIS — E1165 Type 2 diabetes mellitus with hyperglycemia: Secondary | ICD-10-CM | POA: Diagnosis not present

## 2016-11-20 DIAGNOSIS — M79604 Pain in right leg: Secondary | ICD-10-CM | POA: Diagnosis not present

## 2016-11-20 DIAGNOSIS — S72461A Displaced supracondylar fracture with intracondylar extension of lower end of right femur, initial encounter for closed fracture: Secondary | ICD-10-CM | POA: Diagnosis not present

## 2016-11-20 DIAGNOSIS — I120 Hypertensive chronic kidney disease with stage 5 chronic kidney disease or end stage renal disease: Secondary | ICD-10-CM | POA: Diagnosis present

## 2016-11-20 DIAGNOSIS — Z79899 Other long term (current) drug therapy: Secondary | ICD-10-CM

## 2016-11-20 DIAGNOSIS — S72421D Displaced fracture of lateral condyle of right femur, subsequent encounter for closed fracture with routine healing: Secondary | ICD-10-CM | POA: Diagnosis not present

## 2016-11-20 DIAGNOSIS — D62 Acute posthemorrhagic anemia: Secondary | ICD-10-CM | POA: Diagnosis not present

## 2016-11-20 DIAGNOSIS — T148XXA Other injury of unspecified body region, initial encounter: Secondary | ICD-10-CM

## 2016-11-20 DIAGNOSIS — M6281 Muscle weakness (generalized): Secondary | ICD-10-CM | POA: Diagnosis not present

## 2016-11-20 DIAGNOSIS — S7291XA Unspecified fracture of right femur, initial encounter for closed fracture: Secondary | ICD-10-CM | POA: Diagnosis present

## 2016-11-20 DIAGNOSIS — Z809 Family history of malignant neoplasm, unspecified: Secondary | ICD-10-CM

## 2016-11-20 DIAGNOSIS — R531 Weakness: Secondary | ICD-10-CM | POA: Diagnosis not present

## 2016-11-20 DIAGNOSIS — E1129 Type 2 diabetes mellitus with other diabetic kidney complication: Secondary | ICD-10-CM | POA: Diagnosis not present

## 2016-11-20 DIAGNOSIS — S92909A Unspecified fracture of unspecified foot, initial encounter for closed fracture: Secondary | ICD-10-CM | POA: Diagnosis not present

## 2016-11-20 DIAGNOSIS — I959 Hypotension, unspecified: Secondary | ICD-10-CM | POA: Diagnosis not present

## 2016-11-20 DIAGNOSIS — S72001A Fracture of unspecified part of neck of right femur, initial encounter for closed fracture: Secondary | ICD-10-CM | POA: Diagnosis not present

## 2016-11-20 DIAGNOSIS — M25661 Stiffness of right knee, not elsewhere classified: Secondary | ICD-10-CM

## 2016-11-20 DIAGNOSIS — E8889 Other specified metabolic disorders: Secondary | ICD-10-CM | POA: Diagnosis not present

## 2016-11-20 DIAGNOSIS — S79191A Other physeal fracture of lower end of right femur, initial encounter for closed fracture: Secondary | ICD-10-CM | POA: Diagnosis not present

## 2016-11-20 DIAGNOSIS — Z7984 Long term (current) use of oral hypoglycemic drugs: Secondary | ICD-10-CM | POA: Diagnosis not present

## 2016-11-20 DIAGNOSIS — E781 Pure hyperglyceridemia: Secondary | ICD-10-CM

## 2016-11-20 DIAGNOSIS — D72829 Elevated white blood cell count, unspecified: Secondary | ICD-10-CM | POA: Diagnosis not present

## 2016-11-20 DIAGNOSIS — E785 Hyperlipidemia, unspecified: Secondary | ICD-10-CM | POA: Diagnosis present

## 2016-11-20 DIAGNOSIS — S8291XA Unspecified fracture of right lower leg, initial encounter for closed fracture: Secondary | ICD-10-CM

## 2016-11-20 DIAGNOSIS — E1100 Type 2 diabetes mellitus with hyperosmolarity without nonketotic hyperglycemic-hyperosmolar coma (NKHHC): Secondary | ICD-10-CM | POA: Diagnosis not present

## 2016-11-20 DIAGNOSIS — E039 Hypothyroidism, unspecified: Secondary | ICD-10-CM | POA: Diagnosis present

## 2016-11-20 DIAGNOSIS — I10 Essential (primary) hypertension: Secondary | ICD-10-CM | POA: Diagnosis not present

## 2016-11-20 DIAGNOSIS — W19XXXA Unspecified fall, initial encounter: Secondary | ICD-10-CM | POA: Diagnosis not present

## 2016-11-20 LAB — COMPREHENSIVE METABOLIC PANEL
ALT: 33 U/L (ref 17–63)
AST: 27 U/L (ref 15–41)
Albumin: 3.7 g/dL (ref 3.5–5.0)
Alkaline Phosphatase: 77 U/L (ref 38–126)
Anion gap: 8 (ref 5–15)
BUN: 46 mg/dL — ABNORMAL HIGH (ref 6–20)
CO2: 28 mmol/L (ref 22–32)
Calcium: 9 mg/dL (ref 8.9–10.3)
Chloride: 104 mmol/L (ref 101–111)
Creatinine, Ser: 5.39 mg/dL — ABNORMAL HIGH (ref 0.61–1.24)
GFR calc Af Amer: 11 mL/min — ABNORMAL LOW (ref >60)
GFR calc non Af Amer: 10 mL/min — ABNORMAL LOW (ref >60)
Glucose, Bld: 118 mg/dL — ABNORMAL HIGH (ref 65–99)
Potassium: 5.5 mmol/L — ABNORMAL HIGH (ref 3.5–5.1)
Sodium: 140 mmol/L (ref 135–145)
Total Bilirubin: 0.4 mg/dL (ref 0.3–1.2)
Total Protein: 7.1 g/dL (ref 6.5–8.1)

## 2016-11-20 LAB — CBC WITH DIFFERENTIAL/PLATELET
Basophils Absolute: 0 10*3/uL (ref 0.0–0.1)
Basophils Relative: 0 %
Eosinophils Absolute: 0.2 10*3/uL (ref 0.0–0.7)
Eosinophils Relative: 2 %
HCT: 28.6 % — ABNORMAL LOW (ref 39.0–52.0)
Hemoglobin: 9 g/dL — ABNORMAL LOW (ref 13.0–17.0)
Lymphocytes Relative: 16 %
Lymphs Abs: 1.4 10*3/uL (ref 0.7–4.0)
MCH: 34.9 pg — ABNORMAL HIGH (ref 26.0–34.0)
MCHC: 31.5 g/dL (ref 30.0–36.0)
MCV: 110.9 fL — ABNORMAL HIGH (ref 78.0–100.0)
Monocytes Absolute: 0.8 10*3/uL (ref 0.1–1.0)
Monocytes Relative: 9 %
Neutro Abs: 6.4 10*3/uL (ref 1.7–7.7)
Neutrophils Relative %: 73 %
Platelets: 176 10*3/uL (ref 150–400)
RBC: 2.58 MIL/uL — ABNORMAL LOW (ref 4.22–5.81)
RDW: 13.6 % (ref 11.5–15.5)
WBC: 8.8 10*3/uL (ref 4.0–10.5)

## 2016-11-20 LAB — CBC
HCT: 26.2 % — ABNORMAL LOW (ref 39.0–52.0)
Hemoglobin: 8.5 g/dL — ABNORMAL LOW (ref 13.0–17.0)
MCH: 34.4 pg — ABNORMAL HIGH (ref 26.0–34.0)
MCHC: 32.4 g/dL (ref 30.0–36.0)
MCV: 106.1 fL — ABNORMAL HIGH (ref 78.0–100.0)
Platelets: 143 10*3/uL — ABNORMAL LOW (ref 150–400)
RBC: 2.47 MIL/uL — ABNORMAL LOW (ref 4.22–5.81)
RDW: 14.3 % (ref 11.5–15.5)
WBC: 8 10*3/uL (ref 4.0–10.5)

## 2016-11-20 LAB — GLUCOSE, CAPILLARY: Glucose-Capillary: 101 mg/dL — ABNORMAL HIGH (ref 65–99)

## 2016-11-20 LAB — PROTIME-INR
INR: 1
Prothrombin Time: 13.2 seconds (ref 11.4–15.2)

## 2016-11-20 MED ORDER — NIACIN ER (ANTIHYPERLIPIDEMIC) 500 MG PO TBCR
1000.0000 mg | EXTENDED_RELEASE_TABLET | Freq: Every day | ORAL | Status: DC
Start: 2016-11-20 — End: 2016-11-26
  Administered 2016-11-20 – 2016-11-25 (×6): 1000 mg via ORAL
  Filled 2016-11-20 (×6): qty 2

## 2016-11-20 MED ORDER — CARVEDILOL 6.25 MG PO TABS
6.2500 mg | ORAL_TABLET | Freq: Two times a day (BID) | ORAL | Status: DC
  Administered 2016-11-21 – 2016-11-23 (×5): 6.25 mg via ORAL
  Filled 2016-11-20 (×6): qty 1

## 2016-11-20 MED ORDER — TAMSULOSIN HCL 0.4 MG PO CAPS
0.4000 mg | ORAL_CAPSULE | Freq: Every day | ORAL | Status: DC
  Administered 2016-11-21 – 2016-11-26 (×5): 0.4 mg via ORAL
  Filled 2016-11-20 (×5): qty 1

## 2016-11-20 MED ORDER — SODIUM POLYSTYRENE SULFONATE 15 GM/60ML PO SUSP
30.0000 g | ORAL | Status: DC
  Filled 2016-11-20 (×3): qty 120

## 2016-11-20 MED ORDER — OXYCODONE-ACETAMINOPHEN 5-325 MG PO TABS
2.0000 | ORAL_TABLET | Freq: Once | ORAL | Status: AC
  Administered 2016-11-20: 2 via ORAL
  Filled 2016-11-20: qty 2

## 2016-11-20 MED ORDER — FERROUS SULFATE 325 (65 FE) MG PO TABS
325.0000 mg | ORAL_TABLET | Freq: Every day | ORAL | Status: DC
  Administered 2016-11-21 – 2016-11-23 (×2): 325 mg via ORAL
  Filled 2016-11-20 (×2): qty 1

## 2016-11-20 MED ORDER — INSULIN ASPART 100 UNIT/ML ~~LOC~~ SOLN: 0.0000 [IU] | Freq: Every day | SUBCUTANEOUS | Status: DC

## 2016-11-20 MED ORDER — CEFAZOLIN IN D5W 1 GM/50ML IV SOLN
1.0000 g | Freq: Once | INTRAVENOUS | Status: DC
  Filled 2016-11-20: qty 50

## 2016-11-20 MED ORDER — ACETAMINOPHEN 500 MG PO TABS: 1000.0000 mg | ORAL_TABLET | Freq: Once | ORAL | Status: DC

## 2016-11-20 MED ORDER — ONDANSETRON HCL 4 MG/2ML IJ SOLN: 4.0000 mg | Freq: Four times a day (QID) | INTRAMUSCULAR | Status: DC | PRN

## 2016-11-20 MED ORDER — ISOSORB DINITRATE-HYDRALAZINE 20-37.5 MG PO TABS
1.0000 | ORAL_TABLET | Freq: Three times a day (TID) | ORAL | Status: DC
  Administered 2016-11-20 – 2016-11-23 (×9): 1 via ORAL
  Filled 2016-11-20 (×11): qty 1

## 2016-11-20 MED ORDER — SIMVASTATIN 20 MG PO TABS
20.0000 mg | ORAL_TABLET | Freq: Every day | ORAL | Status: DC
  Administered 2016-11-20 – 2016-11-25 (×6): 20 mg via ORAL
  Filled 2016-11-20 (×6): qty 1

## 2016-11-20 MED ORDER — MORPHINE SULFATE (PF) 2 MG/ML IV SOLN
1.0000 mg | INTRAVENOUS | Status: DC | PRN
  Administered 2016-11-21 – 2016-11-22 (×2): 1 mg via INTRAVENOUS
  Filled 2016-11-20 (×2): qty 1

## 2016-11-20 MED ORDER — LINAGLIPTIN 5 MG PO TABS
5.0000 mg | ORAL_TABLET | Freq: Every day | ORAL | Status: DC
  Administered 2016-11-21: 5 mg via ORAL
  Filled 2016-11-20: qty 1

## 2016-11-20 MED ORDER — ONDANSETRON HCL 4 MG PO TABS: 4.0000 mg | ORAL_TABLET | Freq: Four times a day (QID) | ORAL | Status: DC | PRN

## 2016-11-20 MED ORDER — HYDROCODONE-ACETAMINOPHEN 5-325 MG PO TABS
1.0000 | ORAL_TABLET | ORAL | Status: DC | PRN
  Administered 2016-11-20: 2 via ORAL
  Administered 2016-11-21: 1 via ORAL
  Administered 2016-11-21 – 2016-11-23 (×6): 2 via ORAL
  Administered 2016-11-24: 1 via ORAL
  Administered 2016-11-25 (×3): 2 via ORAL
  Administered 2016-11-26 (×2): 1 via ORAL
  Filled 2016-11-20 (×3): qty 2
  Filled 2016-11-20: qty 1
  Filled 2016-11-20 (×2): qty 2
  Filled 2016-11-20 (×2): qty 1
  Filled 2016-11-20 (×3): qty 2
  Filled 2016-11-20: qty 1
  Filled 2016-11-20 (×3): qty 2

## 2016-11-20 MED ORDER — PANTOPRAZOLE SODIUM 40 MG PO TBEC
40.0000 mg | DELAYED_RELEASE_TABLET | Freq: Every day | ORAL | Status: DC
  Administered 2016-11-21 – 2016-11-26 (×5): 40 mg via ORAL
  Filled 2016-11-20 (×5): qty 1

## 2016-11-20 MED ORDER — INSULIN ASPART 100 UNIT/ML ~~LOC~~ SOLN
0.0000 [IU] | Freq: Three times a day (TID) | SUBCUTANEOUS | Status: DC
  Administered 2016-11-23 – 2016-11-24 (×4): 1 [IU] via SUBCUTANEOUS

## 2016-11-20 MED ORDER — METOPROLOL TARTRATE 5 MG/5ML IV SOLN: 5.0000 mg | INTRAVENOUS | Status: DC | PRN

## 2016-11-20 MED ORDER — LEVOTHYROXINE SODIUM 75 MCG PO TABS
75.0000 ug | ORAL_TABLET | Freq: Every day | ORAL | Status: DC
  Administered 2016-11-21 – 2016-11-26 (×5): 75 ug via ORAL
  Filled 2016-11-20 (×5): qty 1

## 2016-11-20 MED ORDER — HEPARIN SODIUM (PORCINE) 5000 UNIT/ML IJ SOLN
5000.0000 [IU] | Freq: Three times a day (TID) | INTRAMUSCULAR | Status: DC
  Administered 2016-11-20 – 2016-11-21 (×3): 5000 [IU] via SUBCUTANEOUS
  Filled 2016-11-20 (×3): qty 1

## 2016-11-20 NOTE — Progress Notes (Signed)
[facility]   Aware of pending transfer Imaging and labs reviewed Chart reviewed  Complex intra-articular R distal femur fracture  Have ordered CT scan of knee for preop planning. Lateral plain film concerning for articular shear  NPO after MN Obtain consent from legal guardian  Labs in am   Anticipate ORIF tomorrow if pt remains stable He is at significant risk for complications including nonunion and infection due to CKD and DM  He will be NWB x 8 weeks post op  Jari Pigg, PA-C [facility] [phone] (P) 11/20/2016 5:55 PM

## 2016-11-20 NOTE — Plan of Care (Signed)
Problem: Safety: Goal: Ability to remain free from injury will improve Outcome: Progressing Safety precautions and fall preventions maintained  Problem: Pain Managment: Goal: General experience of comfort will improve Outcome: Progressing Medicated once for pain with full relief  Problem: Physical Regulation: Goal: Will remain free from infection Outcome: Progressing No S/S of infection noted  Problem: Skin Integrity: Goal: Risk for impaired skin integrity will decrease Outcome: Progressing No skin issues noted upon assessment  Problem: Tissue Perfusion: Goal: Risk factors for ineffective tissue perfusion will decrease Outcome: Progressing Patient is on heparin SQ  Problem: Bowel/Gastric: Goal: Will not experience complications related to bowel motility Outcome: Progressing Denies gastric and bowel issues

## 2016-11-20 NOTE — ED Triage Notes (Signed)
Pt reports he tripped and fell at dialysis.  Pt has laceration on chin and c/o pain to r lower leg.  EMS immobilized leg.  Pt alert and oriented.

## 2016-11-20 NOTE — ED Notes (Addendum)
Report given to [name] at [facility], ALF that pt will be transferred to [facility] for surgery. [facility] [phone]. PT legal representative is DSS.

## 2016-11-20 NOTE — ED Provider Notes (Signed)
Emergency Department Provider Note   I have reviewed the triage vital signs and the nursing notes.   HISTORY  Chief Complaint Fall   HPI: Zachary [name] is a 71 y.o. male with a hx of DM, HLD, HTN, and ARF, who presents to the Emergency Department complaining of a sudden-onset laceration s/p mechanical fall that happened earlier today. Patient states he tripped and fell outside at the nursing home, after receiving dialysis today. Patient has associated right leg pain, EMS mobilized his leg. No modifying factors indicated. Patient denies any loss of consciousness, hitting his head, ankle pain, chest pain, shortness of breath, or pain in any other areas.    Past Medical History:  Diagnosis Date  . Abnormal CT scan, kidney 10/06/2011  . Acute pyelonephritis 10/07/2011  . Anemia    normocytic  . Anxiety    mental retardation  . Bladder wall thickening 10/06/2011  . BPH (benign prostatic hypertrophy)   . Diabetes mellitus   . Edema     history of lower extremity edema  . Heme positive stool   . Hydronephrosis   . Hyperkalemia   . Hyperlipidemia   . Hypernatremia   . Hypertension   . Hypothyroidism   . Impaired speech   . 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 MR (mental retardation)   . Obstructive uropathy   . 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 Poor historian poor historian  . Protein calorie malnutrition ([place])   . Pyelonephritis   . Renal failure (ARF), acute on chronic (HCC)   . Renal insufficiency    chronic history  . Smoking   . Uremia   . Urinary retention   . UTI (lower urinary tract infection) 10/06/2011  . UTI (lower urinary tract infection)     Patient Active Problem List   Diagnosis Date Noted  . Right femoral fracture ([place]) 11/20/2016  . AKI (acute kidney injury) ([place]) 04/03/2016  . Uremia 03/10/2016  . Hydronephrosis, bilateral 03/10/2016  . Acute on chronic renal failure ([place]) 03/10/2016  . End stage renal disease ([place]) 10/08/2015  .  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 Left arm pain 09/27/2015  . Cold sensation of skin-Left Hand 09/27/2015  . Left arm swelling 09/27/2015  . Pyelonephritis 09/13/2015  . UTI (lower urinary tract infection) 07/20/2015  . Heme positive stool   . Absolute anemia   . Abnormal CT scan, esophagus   . Protein-calorie malnutrition, severe ([place]) 07/10/2015  . Acute renal failure superimposed on stage 4 chronic kidney disease ([place]) 07/08/2015  . Nausea and vomiting 07/08/2015  . Essential hypertension 07/08/2015  . Hypernatremia 07/08/2015  . Hyperlipidemia 07/08/2015  . Nausea & vomiting 07/08/2015  . Hyperkalemia 05/22/2015  . Obstructive uropathy 04/17/2015  . ARF (acute renal failure) ([place]) 04/17/2015  . Bladder neck contracture   . Urinary retention   . Anemia, chronic disease 04/16/2015  . Benign prostatic hyperplasia with urinary retention 03/03/2015  . BPH (benign prostatic hypertrophy) with urinary obstruction 01/04/2014  . BPH (benign prostatic hypertrophy) 12/29/2013  . Acute renal failure ([place]) 12/25/2013  . Renal insufficiency 12/25/2013  . Dehydration 12/25/2013  . Mental retardation 10/10/2011  . Acute pyelonephritis 10/07/2011  . 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 Bladder wall thickening 10/06/2011  . DM (diabetes mellitus), type 2, uncontrolled ([place]) 10/02/2011  . Iron deficiency anemia 10/02/2011  . Bacteremia 10/02/2011  . FX CLOSED FIBULA NOS 02/10/2008    Past Surgical History:  Procedure Laterality Date  . AV FISTULA  PLACEMENT Left 07/06/2015   Procedure:  INSERTION LEFT ARM ARTERIOVENOUS GORTEX GRAFT;  Surgeon: Angelia Mould, MD;  Location: [facility];  Service: Vascular;  Laterality: Left;  . AV FISTULA PLACEMENT Right 02/26/2016   Procedure: ARTERIOVENOUS (AV) FISTULA CREATION ;  Surgeon: Angelia Mould, MD;  Location: [facility];  Service: Vascular;  Laterality: Right;  . [place] REMOVAL Left 10/09/2015   Procedure: REMOVAL OF  ARTERIOVENOUS GORETEX GRAFT ([place]) Evacuation of Lymphocele, Vein Patch angioplasty of brachial artery.;  Surgeon: Angelia Mould, MD;  Location: [place] [facility];  Service: Vascular;  Laterality: Left;  . BASCILIC VEIN TRANSPOSITION Right 02/26/2016   Procedure: Right BASCILIC VEIN TRANSPOSITION;  Surgeon: Angelia Mould, MD;  Location: [facility];  Service: Vascular;  Laterality: Right;  . CIRCUMCISION N/A 01/04/2014   Procedure: CIRCUMCISION ADULT (procedure #1);  Surgeon: Marissa Nestle, MD;  Location: [facility];  Service: Urology;  Laterality: N/A;  . CYSTOSCOPY W/ RETROGRADES Bilateral 06/29/2015   Procedure: CYSTOSCOPY, DILATION OF URETHRAL STRICTURE WITH BILATERAL RETROGRADE PYELOGRAM,SUPRAPUBIC TUBE CHANGE;  Surgeon: Festus Aloe, MD;  Location: [facility];  Service: Urology;  Laterality: Bilateral;  . CYSTOSCOPY WITH URETHRAL DILATATION N/A 12/29/2013   Procedure: CYSTOSCOPY WITH URETHRAL DILATATION;  Surgeon: Marissa Nestle, MD;  Location: [facility];  Service: Urology;  Laterality: N/A;  . PERIPHERAL VASCULAR CATHETERIZATION N/A 10/08/2015   Procedure: A/V Shuntogram;  Surgeon: Angelia Mould, MD;  Location: [facility];  Service: Cardiovascular;  Laterality: N/A;  . TRANSURETHRAL RESECTION OF PROSTATE N/A 01/04/2014   Procedure: TRANSURETHRAL RESECTION OF THE PROSTATE (TURP) (procedure #2);  Surgeon: Marissa Nestle, MD;  Location: [facility];  Service: Urology;  Laterality: N/A;    Current Outpatient Rx  . Order #: [id number] Class: Historical Med  . Order #: [id number] Class: Normal  . Order #: [id number] Class: Historical Med  . Order #: [id number] Class: Historical Med  . Order #: [id number] Class: Normal  . Order #: [id number] Class: Historical Med  . Order #: [id number] Class: Historical Med  . Order #: [id number] Class: Historical Med  . Order #: [id number] Class: Historical Med  . Order #: [id number] Class: Historical Med  . Order #: [id number] Class: Historical Med  . Order #:  [id number] Class: Historical Med  . Order #: [id number] Class: Normal  . Order #: [id number] Class: Normal    Allergies Patient has no known allergies.  Family History  Problem Relation Age of Onset  . Cancer Mother     Social History Social History  Substance Use Topics  . Smoking status: Never Smoker  . Smokeless tobacco: Never Used  . Alcohol use No     Comment: occ. use     Review of Systems Constitutional: No fever/chills Eyes: No visual changes. ENT: No sore throat. Cardiovascular: Denies chest pain. Respiratory: Denies shortness of breath. Gastrointestinal: No abdominal pain.  No nausea, no vomiting.  No diarrhea.  No constipation. Genitourinary: Negative for dysuria. Musculoskeletal: Negative for back pain. Skin: Negative for rash. Neurological: Negative for headaches, focal weakness or numbness.  10-point ROS otherwise negative.  ____________________________________________   PHYSICAL EXAM:  VITAL SIGNS: ED Triage Vitals  Enc Vitals Group     BP 11/20/16 1230 135/83     Pulse Rate 11/20/16 1230 95     Resp 11/20/16 1230 18     Temp 11/20/16 1230 97.9 F (36.6 C)     Temp Source 11/20/16 1230 Oral     SpO2 11/20/16 1230 100 %     Pain Score 11/20/16 1222 2   Constitutional:  Alert and oriented. Well appearing and in no acute distress. Eyes: Conjunctivae are normal.  Head: Atraumatic. Nose: No congestion/rhinnorhea. Mouth/Throat: Mucous membranes are moist.  Oropharynx non-erythematous. Neck: No stridor.  No cervical spine tenderness to palpation. Cardiovascular: Normal rate, regular rhythm. Good peripheral circulation. Grossly normal heart sounds.   Respiratory: Normal respiratory effort.  No retractions. Lungs CTAB. Gastrointestinal: Soft and nontender. No distention.  Musculoskeletal: Right knee swelling and pain. No ankle tenderness. Normal ROM of the right hip. No evidence of open fracture. The right foot is neurovascularly intact.  Neurologic:   Normal speech and language. No gross focal neurologic deficits are appreciated.  Skin:  Skin is warm, dry and intact. No rash noted. Psychiatric: Mood and affect are normal. Speech and behavior are normal.  ____________________________________________   LABS (all labs ordered are listed, but only abnormal results are displayed)  Labs Reviewed  COMPREHENSIVE METABOLIC PANEL - Abnormal; Notable for the following:       Result Value   Potassium 5.5 (*)    Glucose, Bld 118 (*)    BUN 46 (*)    Creatinine, Ser 5.39 (*)    GFR calc non Af Amer 10 (*)    GFR calc Af Amer 11 (*)    All other components within normal limits  CBC WITH DIFFERENTIAL/PLATELET - Abnormal; Notable for the following:    RBC 2.58 (*)    Hemoglobin 9.0 (*)    HCT 28.6 (*)    MCV 110.9 (*)    MCH 34.9 (*)    All other components within normal limits  PROTIME-INR   ____________________________________________  EKG  Reviewed in MUSE. Normal rate, rhythm, and axis. No STEMI. Narrow QRS.  ____________________________________________  RADIOLOGY  Dg Chest 1 View  Result Date: 11/20/2016 CLINICAL DATA:  Femur fracture. Shortness of breath. Preoperative evaluation. EXAM: CHEST 1 VIEW COMPARISON:  03/06/2016 FINDINGS: Heart size is normal. Mediastinal shadows are normal. Right lung is clear. There is mild atelectasis at the left base. No effusion. No acute bone finding. IMPRESSION: Mild atelectasis at the left base. Electronically Signed   By: Nelson Chimes M.D.   On: 11/20/2016 15:25   Dg Knee 2 Views Right  Result Date: 11/20/2016 CLINICAL DATA:  Pain and swelling RIGHT knee EXAM: RIGHT KNEE - 1-2 VIEW COMPARISON:  None FINDINGS: Osseous demineralization. Joint spaces preserved. Comminuted fracture of the distal RIGHT femoral metaphysis with apex anterior angulation and mild posterior displacement. Intra-articular extension into the intercondylar notch. Associated soft tissue swelling and deformity. No additional  fracture, dislocation, or bone destruction. IMPRESSION: Comminuted displaced and angulated fracture of the distal RIGHT femoral metaphysis with intra-articular extension into the RIGHT knee. Osseous demineralization. Electronically Signed   By: Lavonia Dana M.D.   On: 11/20/2016 13:41   Ct Knee Right Wo Contrast  Result Date: 11/20/2016 CLINICAL DATA:  Femoral fracture after fall at dialysis. EXAM: CT OF THE RIGHT KNEE WITHOUT CONTRAST TECHNIQUE: Multidetector CT imaging of the RIGHT knee was performed according to the standard protocol. Multiplanar CT image reconstructions were also generated. COMPARISON:  Radiographs from 11/20/2016 of the right knee FINDINGS: Bones/Joint/Cartilage Acute, comminuted and closed T-shaped supracondylar fracture of the right femur with sagittal fracture component extending through the intercondylar notch into the femorotibial compartment along the medial aspect of the lateral femoral condyle. The fracture involving the lateral femoral condyle causes the articular surface to be disrupted by 4 mm along the anterior weight-bearing portion of the condyles by 4 mm on the sagittal reformats (  image 55,series 6) will you. Fracture also extends into the lateral patellofemoral compartment. Lipohemarthrosis is noted. There is 1/4 shaft with displacement of the distal fracture fragment. A nondisplaced sagittally oriented posterior cortical fracture extends more proximally of the diaphysis of the femoral shaft, incompletely included that extends at least to the junction of the middle and distal third when correlated with same day radiographs. No tibial fracture. Ligaments Suboptimally assessed by CT. Muscles and Tendons Diffuse periarticular intramuscular edema without intramuscular hematoma. Soft tissues Lipohemarthrosis of the knee joint. Periarticular soft tissue edema. IMPRESSION: Acute, closed, comminuted and dorsally displaced T-shaped supracondylar fracture of the right femur extending  into the femorotibial and patellofemoral compartments as described with associated lipohemarthrosis. No tibial fracture. There is a nondisplaced posterior diaphyseal fracture of the femoral shaft that extends more proximally at least to the junction of the middle and distal third. The proximal extent is not entirely included on this study of the knee. Electronically Signed   By: Ashley Royalty M.D.   On: 11/20/2016 19:27    ____________________________________________   PROCEDURES  Procedure(s) performed:   Procedures  None ____________________________________________   INITIAL IMPRESSION / ASSESSMENT AND PLAN / ED COURSE  Pertinent labs & imaging results that were available during my care of the patient were reviewed by me and considered in my medical decision making (see chart for details).  Patient resents to the emergency pertinent for evaluation after mechanical fall. He has a small amount of blood in his chin but no underlying laceration in need of repair. No cervical spine tenderness to palpation. No evidence of head trauma. Not a particularly concerning mechanism. The patient does have significant pain and swelling over his right knee plan for plain film and reassessment.   1:20 PM Patient with a, needed displaced in a delayed fracture of the distal right femoral metaphysis with extension to the knee. Plan for orthopedic consultation and assistance in managing this injury.   02:25 PM Spoke with Dr. Marcelino Scot with Orthopedics. He recommends long leg splint and admission to the hospitalist service at [facility] given his multiple medical comorbidities. We will evaluate when he arrives there for surgical timing.   Discussed patient's case with hospitalist, Dr. Candiss Norse.  Recommend admission to inpatient, med-surg bed.  I will place holding orders per their request. Patient and family (if present) updated with plan. Care transferred to hospitalist service. Will arrange transport to [facility].  I am happy to complete EMTALA and have done so.   I reviewed all nursing notes, vitals, pertinent old records, EKGs, labs, imaging (as available).  ____________________________________________  FINAL CLINICAL IMPRESSION(S) / ED DIAGNOSES  Final diagnoses:  Closed fracture of right lower extremity, initial encounter     MEDICATIONS GIVEN DURING THIS VISIT:  Medications  ceFAZolin (ANCEF) IVPB 1 g/50 mL premix (not administered)  oxyCODONE-acetaminophen (PERCOCET/ROXICET) 5-325 MG per tablet 2 tablet (2 tablets Oral Given 11/20/16 1410)     NEW OUTPATIENT MEDICATIONS STARTED DURING THIS VISIT:  None   Note:  This document was prepared using Dragon voice recognition software and may include unintentional dictation errors.  Nanda Quinton, MD Emergency Medicine  By signing my name below, I, Collene Leyden, attest that this documentation has been prepared under the direction and in the presence of Margette Fast, MD. Electronically Signed: Collene Leyden, Scribe. 11/20/16. 1:07 PM.  I personally performed the services described in this documentation, which was scribed in my presence. The recorded information has been reviewed and is accurate.  Margette Fast, MD 11/20/16 2105

## 2016-11-20 NOTE — ED Notes (Signed)
Called [facility] with Bed assignment.  "May be awhile before they can send a truck". Per Maudie Mercury at [facility].

## 2016-11-20 NOTE — H&P (Addendum)
TRH H&P   Patient Demographics:    Zachary [name], is a 71 y.o. male  MRN: [id number]   DOB - [birth year]  Admit Date - 11/20/2016  Outpatient Primary MD for the patient is [name], MD  Outpatient Specialists: Dr Hinda Lenis nephrologist    Patient coming from: SNF/ALF  Chief Complaint  Patient presents with  . Fall      HPI:    Zachary [name]  is a 71 y.o. male,  History of ESRD on Tuesday, Thursday, Saturday dialysis schedule was dialyzed today, mild mental retardation, BPH, DM type II, hypertension, hypothyroidism, GERD, dyslipidemia, hypertension who lives at assisted living/SNF facility finished his to [facility] and was walking into the facility where he had a trip followed by a fall where he injured his right leg.  He came to the ER where x-rays showed right distal femur fracture, his labs, EKG and chest x-ray are pending. Patient is relatively symptom-free and surprisingly is not having a lot of pain in the right leg. He confirms that this was a trip/fall. On a good day he can climb one to 2 flights of stairs and walk several blocks without any chest pain or shortness of breath. Currently he is completely symptom free without any headache, no chest pain palpitations or shortness of breath.  Our physician called orthopedics on call at [facility] and I discussed the case with orthopedics on call Dr. Marcelino Scot, he will see the patient at [facility] on 11/21/2016.     Review of systems:    In addition to the HPI above, symptom free No Fever-chills, No Headache, No changes with Vision or hearing, No problems swallowing food or Liquids, No Chest pain, Cough or Shortness of Breath, No Abdominal pain,  No Nausea or Vommitting, Bowel movements are regular, No Blood in stool or Urine, No dysuria, No new skin rashes or bruises, No new joints pains-aches,  No new weakness, tingling, numbness in any extremity, No recent weight gain or loss, No polyuria, polydypsia or polyphagia, No significant Mental Stressors.  A full 10 point Review of Systems was done, except as stated above, all other Review of Systems were negative.   With Past History of the following :    Past Medical History:  Diagnosis Date  . Abnormal CT scan, kidney 10/06/2011  . Acute pyelonephritis 10/07/2011  .  Anemia    normocytic  . Anxiety    mental retardation  . Bladder wall thickening 10/06/2011  . BPH (benign prostatic hypertrophy)   . Diabetes mellitus   . Edema     history of lower extremity edema  . Heme positive stool   . Hydronephrosis   . Hyperkalemia   . Hyperlipidemia   . Hypernatremia   . Hypertension   . Hypothyroidism   . Impaired speech   . 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 MR (mental retardation)   . Obstructive uropathy   . 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 Poor historian poor historian  . Protein calorie malnutrition ([place])   . Pyelonephritis   . Renal failure (ARF), acute on chronic (HCC)   . Renal insufficiency    chronic history  . Smoking   . Uremia   . Urinary retention   . UTI (lower urinary tract infection) 10/06/2011  . UTI (lower urinary tract infection)       Past Surgical History:  Procedure Laterality Date  . AV FISTULA PLACEMENT Left 07/06/2015   Procedure:  INSERTION LEFT ARM ARTERIOVENOUS GORTEX GRAFT;  Surgeon: Angelia Mould, MD;  Location: [place];  Service: Vascular;  Laterality: Left;  . AV FISTULA PLACEMENT Right 02/26/2016   Procedure: ARTERIOVENOUS (AV) FISTULA CREATION ;  Surgeon: Angelia Mould, MD;  Location: [place];  Service: Vascular;  Laterality: Right;  . [place] REMOVAL Left 10/09/2015   Procedure: REMOVAL OF ARTERIOVENOUS  GORETEX GRAFT ([place]) Evacuation of Lymphocele, Vein Patch angioplasty of brachial artery.;  Surgeon: Angelia Mould, MD;  Location: [place];  Service: Vascular;  Laterality: Left;  . BASCILIC VEIN TRANSPOSITION Right 02/26/2016   Procedure: Right BASCILIC VEIN TRANSPOSITION;  Surgeon: Angelia Mould, MD;  Location: [place];  Service: Vascular;  Laterality: Right;  . CIRCUMCISION N/A 01/04/2014   Procedure: CIRCUMCISION ADULT (procedure #1);  Surgeon: Marissa Nestle, MD;  Location: [facility];  Service: Urology;  Laterality: N/A;  . CYSTOSCOPY W/ RETROGRADES Bilateral 06/29/2015   Procedure: CYSTOSCOPY, DILATION OF URETHRAL STRICTURE WITH BILATERAL RETROGRADE PYELOGRAM,SUPRAPUBIC TUBE CHANGE;  Surgeon: Festus Aloe, MD;  Location: [facility];  Service: Urology;  Laterality: Bilateral;  . CYSTOSCOPY WITH URETHRAL DILATATION N/A 12/29/2013   Procedure: CYSTOSCOPY WITH URETHRAL DILATATION;  Surgeon: Marissa Nestle, MD;  Location: [facility];  Service: Urology;  Laterality: N/A;  . PERIPHERAL VASCULAR CATHETERIZATION N/A 10/08/2015   Procedure: A/V Shuntogram;  Surgeon: Angelia Mould, MD;  Location: [place] [facility];  Service: Cardiovascular;  Laterality: N/A;  . TRANSURETHRAL RESECTION OF PROSTATE N/A 01/04/2014   Procedure: TRANSURETHRAL RESECTION OF THE PROSTATE (TURP) (procedure #2);  Surgeon: Marissa Nestle, MD;  Location: [facility];  Service: Urology;  Laterality: N/A;      Social History:     Social History  Substance Use Topics  . Smoking status: Never Smoker  . Smokeless tobacco: Never Used  . Alcohol use No     Comment: occ. use          Family History :     Family History  Problem Relation Age of Onset  . Cancer Mother        Home Medications:   Prior to Admission medications   Medication Sig Start Date End Date Taking? Authorizing Provider  carvedilol (COREG) 6.25 MG tablet Take 1 tablet (6.25 mg total) by mouth 2 (two) times daily with a meal. 03/08/15    [name], MD  ferrous sulfate  325 (65 FE) MG tablet Take 325 mg by mouth daily with breakfast.    Historical Provider, MD  isosorbide-hydrALAZINE (BIDIL) 20-37.5 MG per tablet Take 1 tablet by mouth 3 (three) times daily. 03/08/15   [name], MD  levofloxacin (LEVAQUIN) 500 MG tablet Take 1 tablet (500 mg total) by mouth daily. 04/07/16   [name], MD  levothyroxine (SYNTHROID, LEVOTHROID) 75 MCG tablet Take 75 mcg by mouth daily before breakfast.     Historical Provider, MD  linagliptin (TRADJENTA) 5 MG TABS tablet Take 5 mg by mouth daily.    Historical Provider, MD  niacin (NIASPAN) 1000 MG CR tablet Take 1,000 mg by mouth at bedtime.    Historical Provider, MD  omeprazole (PRILOSEC) 20 MG capsule Take 40 mg by mouth daily.    Historical Provider, MD  ondansetron (ZOFRAN) 4 mg TABS tablet Take 1 tablet by mouth every 8 hours when necessary nausea. 03/02/16   Leonard Schwartz, MD  oxyCODONE-acetaminophen (ROXICET) 5-325 MG tablet Take 1 tablet by mouth every 6 (six) hours as needed. Patient taking differently: Take 1 tablet by mouth every 6 (six) hours as needed for moderate pain.  02/26/16   Alvia Grove, PA-C  simvastatin (ZOCOR) 20 MG tablet Take 20 mg by mouth at bedtime.  02/28/15   Historical Provider, MD  sodium polystyrene (KAYEXALATE) 15 GM/60ML suspension Take 30 g by mouth every Monday, Wednesday, and Friday. Reported on 04/09/2016    Historical Provider, MD  tamsulosin (FLOMAX) 0.4 MG CAPS capsule Take 0.4 mg by mouth daily.    Historical Provider, MD  torsemide (DEMADEX) 10 MG tablet Take 5 tablets (50 mg total) by mouth daily. 04/07/16   [name], MD  Vitamin D, Ergocalciferol, (DRISDOL) 50000 UNITS CAPS capsule Take 1 capsule (50,000 Units total) by mouth every 7 (seven) days. Patient taking differently: Take 50,000 Units by mouth every Monday.  03/08/15   [name], MD     Allergies:    No Known Allergies   Physical Exam:   Vitals  Blood pressure 135/83,  pulse 95, temperature 97.9 F (36.6 C), temperature source Oral, resp. rate 18, SpO2 100 %.   1. General Middle-aged African-American male lying in bed in NAD,    2. Normal affect and insight, Not Suicidal or Homicidal, Awake Alert, Oriented X 2.  3. No F.N deficits, ALL C.Nerves Intact, Strength 5/5 all 4 extremities, Sensation intact all 4 extremities, Plantars down going.  4. Ears and Eyes appear Normal, Conjunctivae clear, PERRLA. Moist Oral Mucosa.  5. Supple Neck, No JVD, No cervical lymphadenopathy appriciated, No Carotid Bruits.  6. Symmetrical Chest wall movement, Good air movement bilaterally, CTAB.  7. RRR, No Gallops, Rubs or Murmurs, No Parasternal Heave.  8. Positive Bowel Sounds, Abdomen Soft, No tenderness, No organomegaly appriciated,No rebound -guarding or rigidity.  9.  No Cyanosis, Normal Skin Turgor, No Skin Rash or Bruise.  10. Good muscle tone,  joints appear normal , no effusions, Normal ROM. Right leg in splint, and swelling around the right knee and mild tenderness on palpation.  11. No Palpable Lymph Nodes in Neck or Axillae      Data Review:    CBC No results for input(s): WBC, HGB, HCT, PLT, MCV, MCH, MCHC, RDW, LYMPHSABS, MONOABS, EOSABS, BASOSABS, BANDABS in the last 168 hours.  Invalid input(s): NEUTRABS, BANDSABD ------------------------------------------------------------------------------------------------------------------  Chemistries  No results for input(s): NA, K, CL, CO2, GLUCOSE, BUN, CREATININE, CALCIUM, MG, AST, ALT, ALKPHOS, BILITOT in the last 168 hours.  Invalid input(s): GFRCGP ------------------------------------------------------------------------------------------------------------------ CrCl cannot be calculated (Patient's most recent lab result is older than the maximum 21 days allowed.). ------------------------------------------------------------------------------------------------------------------ No results for  input(s): TSH, T4TOTAL, T3FREE, THYROIDAB in the last 72 hours.  Invalid input(s): FREET3  Coagulation profile No results for input(s): INR, PROTIME in the last 168 hours. ------------------------------------------------------------------------------------------------------------------- No results for input(s): DDIMER in the last 72 hours. -------------------------------------------------------------------------------------------------------------------  Cardiac Enzymes No results for input(s): CKMB, TROPONINI, MYOGLOBIN in the last 168 hours.  Invalid input(s): CK ------------------------------------------------------------------------------------------------------------------ No results found for: BNP   ---------------------------------------------------------------------------------------------------------------  Urinalysis    Component Value Date/Time   COLORURINE YELLOW 10/09/2016 0930   APPEARANCEUR TURBID (A) 10/09/2016 0930   LABSPEC 1.025 10/09/2016 0930   PHURINE 6.0 10/09/2016 0930   GLUCOSEU NEGATIVE 10/09/2016 0930   HGBUR LARGE (A) 10/09/2016 0930   BILIRUBINUR NEGATIVE 10/09/2016 0930   KETONESUR TRACE (A) 10/09/2016 0930   PROTEINUR 100 (A) 10/09/2016 0930   UROBILINOGEN 0.2 07/20/2015 1850   NITRITE NEGATIVE 10/09/2016 0930   LEUKOCYTESUR LARGE (A) 10/09/2016 0930    ----------------------------------------------------------------------------------------------------------------   Imaging Results:    Dg Knee 2 Views Right  Result Date: 11/20/2016 CLINICAL DATA:  Pain and swelling RIGHT knee EXAM: RIGHT KNEE - 1-2 VIEW COMPARISON:  None FINDINGS: Osseous demineralization. Joint spaces preserved. Comminuted fracture of the distal RIGHT femoral metaphysis with apex anterior angulation and mild posterior displacement. Intra-articular extension into the intercondylar notch. Associated soft tissue swelling and deformity. No additional fracture, dislocation, or  bone destruction. IMPRESSION: Comminuted displaced and angulated fracture of the distal RIGHT femoral metaphysis with intra-articular extension into the RIGHT knee. Osseous demineralization. Electronically Signed   By: Lavonia Dana M.D.   On: 11/20/2016 13:41        Assessment & Plan:     1. Mechanical fall causing right distal femur fracture. Case discussed with orthopedics on call at [facility] Dr. Marcelino Scot who will see the patient either tonight or early tomorrow, to OR on 11/21/2016. Will be nothing by mouth after midnight.  OR staff to be cautious with IV fluids as he is a dialysis patient. Patient will be low to moderate risk for adverse cardiopulmonary outcome during the perioperative period, have ordered IV Lopressor as needed for blood pressure heart rate control. Type screen has been ordered as well.  His basic labs which include CBC, BMP, INR CXR and EKG are pending kindly follow.   2. ESRD. Was dialyzed on 11/20/2016, please consult [facility] at [facility] due on 11-22-17.  3. Hypertension. Continue home dose Coreg and BiDil combination, as needed IV Lopressor ordered.  4. Dyslipidemia. Continue home dose statin.  5. GERD. On PPI.  6. BPH. Continue Flomax unchanged.  7. Mild mental retardation. Supportive care. At risk for delirium. Medical SNF. He is currently at ALF.  8. DM type II. Continue Trajenta, check A1c, add sliding scale.     DVT Prophylaxis Heparin    AM Labs Ordered, also please review Full Orders  Family Communication: Admission, patients condition and plan of care including tests being ordered have been discussed with the patient who indicates understanding and agree with the plan and Code Status.  Code Status Full  Likely DC to    Condition Fair  Consults called: Ortho Dr Marcelino Scot called by me     Admission status: Inpt    Time spent in minutes : 35   SINGH,PRASHANT K M.D on 11/20/2016 at 3:23 PM  Between 7am to 7pm - Pager -  [phone]. After 7pm go to [email or web address] - password [facility]  [facility] - Office  [phone]

## 2016-11-20 NOTE — ED Notes (Signed)
PT's RP is  [name] at [place] [phone] ext [phone]  Supervisor at [facility] [phone] ext [place]  After hours Social worker for DSS [phone]

## 2016-11-21 DIAGNOSIS — N401 Enlarged prostate with lower urinary tract symptoms: Secondary | ICD-10-CM

## 2016-11-21 DIAGNOSIS — N186 End stage renal disease: Secondary | ICD-10-CM

## 2016-11-21 DIAGNOSIS — R338 Other retention of urine: Secondary | ICD-10-CM

## 2016-11-21 DIAGNOSIS — E1100 Type 2 diabetes mellitus with hyperosmolarity without nonketotic hyperglycemic-hyperosmolar coma (NKHHC): Secondary | ICD-10-CM

## 2016-11-21 DIAGNOSIS — S72001A Fracture of unspecified part of neck of right femur, initial encounter for closed fracture: Secondary | ICD-10-CM

## 2016-11-21 LAB — GLUCOSE, CAPILLARY
Glucose-Capillary: 105 mg/dL — ABNORMAL HIGH (ref 65–99)
Glucose-Capillary: 95 mg/dL (ref 65–99)
Glucose-Capillary: 92 mg/dL (ref 65–99)
Glucose-Capillary: 99 mg/dL (ref 65–99)

## 2016-11-21 LAB — RENAL FUNCTION PANEL
Albumin: 2.8 g/dL — ABNORMAL LOW (ref 3.5–5.0)
Anion gap: 9 (ref 5–15)
BUN: 49 mg/dL — ABNORMAL HIGH (ref 6–20)
CO2: 24 mmol/L (ref 22–32)
Calcium: 8.5 mg/dL — ABNORMAL LOW (ref 8.9–10.3)
Chloride: 106 mmol/L (ref 101–111)
Creatinine, Ser: 5.75 mg/dL — ABNORMAL HIGH (ref 0.61–1.24)
GFR calc Af Amer: 11 mL/min — ABNORMAL LOW (ref >60)
GFR calc non Af Amer: 9 mL/min — ABNORMAL LOW (ref >60)
Glucose, Bld: 96 mg/dL (ref 65–99)
Phosphorus: 5 mg/dL — ABNORMAL HIGH (ref 2.5–4.6)
Potassium: 5.6 mmol/L — ABNORMAL HIGH (ref 3.5–5.1)
Sodium: 139 mmol/L (ref 135–145)

## 2016-11-21 LAB — BASIC METABOLIC PANEL
Anion gap: 10 (ref 5–15)
BUN: 48 mg/dL — ABNORMAL HIGH (ref 6–20)
CO2: 24 mmol/L (ref 22–32)
Calcium: 8.7 mg/dL — ABNORMAL LOW (ref 8.9–10.3)
Chloride: 105 mmol/L (ref 101–111)
Creatinine, Ser: 5.5 mg/dL — ABNORMAL HIGH (ref 0.61–1.24)
GFR calc Af Amer: 11 mL/min — ABNORMAL LOW (ref >60)
GFR calc non Af Amer: 10 mL/min — ABNORMAL LOW (ref >60)
Glucose, Bld: 91 mg/dL (ref 65–99)
Potassium: 5.4 mmol/L — ABNORMAL HIGH (ref 3.5–5.1)
Sodium: 139 mmol/L (ref 135–145)

## 2016-11-21 LAB — CBC
HCT: 23 % — ABNORMAL LOW (ref 39.0–52.0)
Hemoglobin: 7.5 g/dL — ABNORMAL LOW (ref 13.0–17.0)
MCH: 34.6 pg — ABNORMAL HIGH (ref 26.0–34.0)
MCHC: 32.6 g/dL (ref 30.0–36.0)
MCV: 106 fL — ABNORMAL HIGH (ref 78.0–100.0)
Platelets: 129 10*3/uL — ABNORMAL LOW (ref 150–400)
RBC: 2.17 MIL/uL — ABNORMAL LOW (ref 4.22–5.81)
RDW: 14.2 % (ref 11.5–15.5)
WBC: 6.3 10*3/uL (ref 4.0–10.5)

## 2016-11-21 LAB — CREATININE, SERUM
Creatinine, Ser: 5.38 mg/dL — ABNORMAL HIGH (ref 0.61–1.24)
GFR calc Af Amer: 11 mL/min — ABNORMAL LOW (ref >60)
GFR calc non Af Amer: 10 mL/min — ABNORMAL LOW (ref >60)

## 2016-11-21 LAB — SURGICAL PCR SCREEN
MRSA, PCR: NEGATIVE
Staphylococcus aureus: NEGATIVE

## 2016-11-21 LAB — PREPARE RBC (CROSSMATCH)

## 2016-11-21 LAB — HEPATITIS B SURFACE ANTIGEN: Hepatitis B Surface Ag: NEGATIVE

## 2016-11-21 MED ORDER — SODIUM CHLORIDE 0.9 % IV SOLN: Freq: Once | INTRAVENOUS | Status: DC

## 2016-11-21 MED ORDER — CEFAZOLIN IN D5W 1 GM/50ML IV SOLN: 1.0000 g | Freq: Once | INTRAVENOUS | Status: DC

## 2016-11-21 MED ORDER — MORPHINE SULFATE (PF) 4 MG/ML IV SOLN
INTRAVENOUS | Status: AC
  Administered 2016-11-21: 1 mg via INTRAVENOUS
  Filled 2016-11-21: qty 1

## 2016-11-21 MED ORDER — DOXERCALCIFEROL 4 MCG/2ML IV SOLN
1.0000 ug | INTRAVENOUS | Status: DC
  Administered 2016-11-26: 1 ug via INTRAVENOUS
  Filled 2016-11-21 (×2): qty 2

## 2016-11-21 MED ORDER — DARBEPOETIN ALFA 150 MCG/0.3ML IJ SOSY
150.0000 ug | PREFILLED_SYRINGE | INTRAMUSCULAR | Status: DC
  Filled 2016-11-21: qty 0.3

## 2016-11-21 MED ORDER — CEFAZOLIN IN D5W 1 GM/50ML IV SOLN
1.0000 g | Freq: Once | INTRAVENOUS | Status: AC
  Administered 2016-11-22: 1 g via INTRAVENOUS
  Filled 2016-11-21: qty 50

## 2016-11-21 NOTE — Consult Note (Signed)
Orthopaedic Trauma Service H&P/Consult     Chief Complaint: R distal femur fracture  HPI:   Zachary [name] is an 71 y.o. mentally handicapped black male with chronic renal failure, on dialysis sustained a after dialysis on Thursday. This resulted in a complex right distal femur fracture. Patient lives long-term nursing facility due to his medical comorbidities. He has a state appointed legal guardian. He was attempting to ambulate back in the facility when he sustained a fall. Landed on his right leg sustaining complex fracture to his right distal femur. Patient was brought to [facility]. X-rays confirmed comminuted right distal femur fracture. Patient was admitted to the medicine service. He was then transferred to [place] for management by the orthopedic trauma service as there was no orthopedic coverage in [place] yesterday.  Patient seen and evaluated this morning he appears stable. Very pleasant individual. He does communicate fairly well but is a poor historian. At the time of dictation we are unable to contact the legal guardian. Order was placed yesterday to obtain consent from his legal guardian this was not done. Nursing did try this morning about 0800 to obtain consent but the office was still closed.  Patient's normal dialysis schedule is Tuesday, Thursday, Saturday. Case has been discussed with internal medicine, anesthesia and renal   Patient reports only pain in his right leg  He states that he does ambulate with a cane at baseline. I am unable to confirm at this time. I also do not know how far he is able to walk on any given day.  Past Medical History:  Diagnosis Date  . Abnormal CT scan, kidney 10/06/2011  . Acute pyelonephritis 10/07/2011  . Anemia    normocytic  . Anxiety    mental retardation  . Bladder wall thickening 10/06/2011  . BPH (benign prostatic hypertrophy)   . Diabetes mellitus   . Edema     history of lower extremity edema  . Heme positive  stool   . Hydronephrosis   . Hyperkalemia   . Hyperlipidemia   . Hypernatremia   . Hypertension   . Hypothyroidism   . Impaired speech   . 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 MR (mental retardation)   . Obstructive uropathy   . 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 Poor historian poor historian  . Protein calorie malnutrition ([place])   . Pyelonephritis   . Renal failure (ARF), acute on chronic (HCC)   . Renal insufficiency    chronic history  . Smoking   . Uremia   . Urinary retention   . UTI (lower urinary tract infection) 10/06/2011  . UTI (lower urinary tract infection)     Past Surgical History:  Procedure Laterality Date  . AV FISTULA PLACEMENT Left 07/06/2015   Procedure:  INSERTION LEFT ARM ARTERIOVENOUS GORTEX GRAFT;  Surgeon: Angelia Mould, MD;  Location: [place];  Service: Vascular;  Laterality: Left;  . AV FISTULA PLACEMENT Right 02/26/2016   Procedure: ARTERIOVENOUS (AV) FISTULA CREATION ;  Surgeon: Angelia Mould, MD;  Location: [place];  Service: Vascular;  Laterality: Right;  . [place] REMOVAL Left 10/09/2015   Procedure: REMOVAL OF ARTERIOVENOUS GORETEX GRAFT ([place]) Evacuation of Lymphocele, Vein Patch angioplasty of brachial artery.;  Surgeon: Angelia Mould, MD;  Location: [place];  Service: Vascular;  Laterality: Left;  . BASCILIC VEIN TRANSPOSITION Right 02/26/2016   Procedure: Right BASCILIC VEIN TRANSPOSITION;  Surgeon: Angelia Mould, MD;  Location: [place];  Service: Vascular;  Laterality: Right;  . CIRCUMCISION N/A 01/04/2014   Procedure: CIRCUMCISION ADULT (procedure #1);  Surgeon: Marissa Nestle, MD;  Location: [facility];  Service: Urology;  Laterality: N/A;  . CYSTOSCOPY W/ RETROGRADES Bilateral 06/29/2015   Procedure: CYSTOSCOPY, DILATION OF URETHRAL STRICTURE WITH BILATERAL RETROGRADE PYELOGRAM,SUPRAPUBIC TUBE CHANGE;  Surgeon: Festus Aloe, MD;  Location: [facility];  Service: Urology;  Laterality: Bilateral;  . CYSTOSCOPY WITH  URETHRAL DILATATION N/A 12/29/2013   Procedure: CYSTOSCOPY WITH URETHRAL DILATATION;  Surgeon: Marissa Nestle, MD;  Location: [facility];  Service: Urology;  Laterality: N/A;  . PERIPHERAL VASCULAR CATHETERIZATION N/A 10/08/2015   Procedure: A/V Shuntogram;  Surgeon: Angelia Mould, MD;  Location: [place] [facility];  Service: Cardiovascular;  Laterality: N/A;  . TRANSURETHRAL RESECTION OF PROSTATE N/A 01/04/2014   Procedure: TRANSURETHRAL RESECTION OF THE PROSTATE (TURP) (procedure #2);  Surgeon: Marissa Nestle, MD;  Location: [facility];  Service: Urology;  Laterality: N/A;    Family History  Problem Relation Age of Onset  . Cancer Mother    Social History:  reports that he has never smoked. He has never used smokeless tobacco. He reports that he does not drink alcohol or use drugs.  Allergies: No Known Allergies  Medications Prior to Admission  Medication Sig Dispense Refill  . atorvastatin (LIPITOR) 40 MG tablet Take 40 mg by mouth every evening.    . carvedilol (COREG) 6.25 MG tablet Take 1 tablet (6.25 mg total) by mouth 2 (two) times daily with a meal. 60 tablet 3  . epoetin alfa (EPOGEN,PROCRIT) 4000 UNIT/ML injection Inject 4,000 Units into the vein every 14 (fourteen) days.    . ferrous sulfate 325 (65 FE) MG tablet Take 325 mg by mouth 2 (two) times daily with a meal.     . isosorbide-hydrALAZINE (BIDIL) 20-37.5 MG per tablet Take 1 tablet by mouth 3 (three) times daily. 90 tablet 3  . levothyroxine (SYNTHROID, LEVOTHROID) 75 MCG tablet Take 75 mcg by mouth daily before breakfast.     . linagliptin (TRADJENTA) 5 MG TABS tablet Take 5 mg by mouth daily.    . Multiple Vitamin (MULTIVITAMIN WITH MINERALS) TABS tablet Take 1 tablet by mouth daily.    . Neo-Bacit-Poly-Lidocaine (FIRST AID PLUS LIDOCAINE EX) Apply 1 application topically 3 (three) times a week. Prior to dialysis    . omeprazole (PRILOSEC) 20 MG capsule Take 40 mg by mouth daily.    . prednisoLONE acetate (PRED  FORTE) 1 % ophthalmic suspension Place 1 drop into the left eye 4 (four) times daily. 2 week course to be completed 11/20/2016    . tamsulosin (FLOMAX) 0.4 MG CAPS capsule Take 0.4 mg by mouth daily.    Marland Kitchen torsemide (DEMADEX) 10 MG tablet Take 5 tablets (50 mg total) by mouth daily. 30 tablet 3  . Vitamin D, Ergocalciferol, (DRISDOL) 50000 UNITS CAPS capsule Take 1 capsule (50,000 Units total) by mouth every 7 (seven) days. (Patient taking differently: Take 50,000 Units by mouth every Monday. ) 30 capsule 3    Results for orders placed or performed during the hospital encounter of 11/20/16 (from the past 48 hour(s))  Comprehensive metabolic panel     Status: Abnormal   Collection Time: 11/20/16  2:46 PM  Result Value Ref Range   Sodium 140 135 - 145 mmol/L   Potassium 5.5 (H) 3.5 - 5.1 mmol/L   Chloride 104 101 - 111 mmol/L   CO2 28 22 - 32 mmol/L   Glucose, Bld 118 (H)  65 - 99 mg/dL   BUN 46 (H) 6 - 20 mg/dL   Creatinine, Ser 5.39 (H) 0.61 - 1.24 mg/dL   Calcium 9.0 8.9 - 10.3 mg/dL   Total Protein 7.1 6.5 - 8.1 g/dL   Albumin 3.7 3.5 - 5.0 g/dL   AST 27 15 - 41 U/L   ALT 33 17 - 63 U/L   Alkaline Phosphatase 77 38 - 126 U/L   Total Bilirubin 0.4 0.3 - 1.2 mg/dL   GFR calc non Af Amer 10 (L) >60 mL/min   GFR calc Af Amer 11 (L) >60 mL/min    Comment: (NOTE) The eGFR has been calculated using the CKD EPI equation. This calculation has not been validated in all clinical situations. eGFR's persistently <60 mL/min signify possible Chronic Kidney Disease.    Anion gap 8 5 - 15  CBC with Differential     Status: Abnormal   Collection Time: 11/20/16  2:46 PM  Result Value Ref Range   WBC 8.8 4.0 - 10.5 K/uL   RBC 2.58 (L) 4.22 - 5.81 MIL/uL   Hemoglobin 9.0 (L) 13.0 - 17.0 g/dL   HCT 28.6 (L) 39.0 - 52.0 %   MCV 110.9 (H) 78.0 - 100.0 fL   MCH 34.9 (H) 26.0 - 34.0 pg   MCHC 31.5 30.0 - 36.0 g/dL   RDW 13.6 11.5 - 15.5 %   Platelets 176 150 - 400 K/uL   Neutrophils Relative % 73  %   Neutro Abs 6.4 1.7 - 7.7 K/uL   Lymphocytes Relative 16 %   Lymphs Abs 1.4 0.7 - 4.0 K/uL   Monocytes Relative 9 %   Monocytes Absolute 0.8 0.1 - 1.0 K/uL   Eosinophils Relative 2 %   Eosinophils Absolute 0.2 0.0 - 0.7 K/uL   Basophils Relative 0 %   Basophils Absolute 0.0 0.0 - 0.1 K/uL  Protime-INR     Status: None   Collection Time: 11/20/16  2:46 PM  Result Value Ref Range   Prothrombin Time 13.2 11.4 - 15.2 seconds   INR 1.00   Glucose, capillary     Status: Abnormal   Collection Time: 11/20/16 10:07 PM  Result Value Ref Range   Glucose-Capillary 101 (H) 65 - 99 mg/dL  CBC     Status: Abnormal   Collection Time: 11/20/16 11:03 PM  Result Value Ref Range   WBC 8.0 4.0 - 10.5 K/uL   RBC 2.47 (L) 4.22 - 5.81 MIL/uL   Hemoglobin 8.5 (L) 13.0 - 17.0 g/dL   HCT 26.2 (L) 39.0 - 52.0 %   MCV 106.1 (H) 78.0 - 100.0 fL   MCH 34.4 (H) 26.0 - 34.0 pg   MCHC 32.4 30.0 - 36.0 g/dL   RDW 14.3 11.5 - 15.5 %   Platelets 143 (L) 150 - 400 K/uL  Creatinine, serum     Status: Abnormal   Collection Time: 11/20/16 11:03 PM  Result Value Ref Range   Creatinine, Ser 5.38 (H) 0.61 - 1.24 mg/dL   GFR calc non Af Amer 10 (L) >60 mL/min   GFR calc Af Amer 11 (L) >60 mL/min    Comment: (NOTE) The eGFR has been calculated using the CKD EPI equation. This calculation has not been validated in all clinical situations. eGFR's persistently <60 mL/min signify possible Chronic Kidney Disease.   Type and screen [facility]     Status: None   Collection Time: 11/20/16 11:03 PM  Result Value Ref Range  ABO/RH(D) B POS    Antibody Screen NEG    Sample Expiration 11/23/2016   Surgical PCR screen     Status: None   Collection Time: 11/21/16 12:05 AM  Result Value Ref Range   MRSA, PCR NEGATIVE NEGATIVE   Staphylococcus aureus NEGATIVE NEGATIVE    Comment:        The Xpert SA Assay (FDA approved for NASAL specimens in patients over 75 years of age), is one component of a  comprehensive surveillance program.  Test performance has been validated by [facility] for patients greater than or equal to 53 year old. It is not intended to diagnose infection nor to guide or monitor treatment.   Basic metabolic panel     Status: Abnormal   Collection Time: 11/21/16  5:56 AM  Result Value Ref Range   Sodium 139 135 - 145 mmol/L   Potassium 5.4 (H) 3.5 - 5.1 mmol/L   Chloride 105 101 - 111 mmol/L   CO2 24 22 - 32 mmol/L   Glucose, Bld 91 65 - 99 mg/dL   BUN 48 (H) 6 - 20 mg/dL   Creatinine, Ser 5.50 (H) 0.61 - 1.24 mg/dL   Calcium 8.7 (L) 8.9 - 10.3 mg/dL   GFR calc non Af Amer 10 (L) >60 mL/min   GFR calc Af Amer 11 (L) >60 mL/min    Comment: (NOTE) The eGFR has been calculated using the CKD EPI equation. This calculation has not been validated in all clinical situations. eGFR's persistently <60 mL/min signify possible Chronic Kidney Disease.    Anion gap 10 5 - 15  CBC     Status: Abnormal   Collection Time: 11/21/16  5:56 AM  Result Value Ref Range   WBC 6.3 4.0 - 10.5 K/uL   RBC 2.17 (L) 4.22 - 5.81 MIL/uL   Hemoglobin 7.5 (L) 13.0 - 17.0 g/dL   HCT 23.0 (L) 39.0 - 52.0 %   MCV 106.0 (H) 78.0 - 100.0 fL   MCH 34.6 (H) 26.0 - 34.0 pg   MCHC 32.6 30.0 - 36.0 g/dL   RDW 14.2 11.5 - 15.5 %   Platelets 129 (L) 150 - 400 K/uL  Glucose, capillary     Status: Abnormal   Collection Time: 11/21/16  6:20 AM  Result Value Ref Range   Glucose-Capillary 105 (H) 65 - 99 mg/dL   Dg Chest 1 View  Result Date: 11/20/2016 CLINICAL DATA:  Femur fracture. Shortness of breath. Preoperative evaluation. EXAM: CHEST 1 VIEW COMPARISON:  03/06/2016 FINDINGS: Heart size is normal. Mediastinal shadows are normal. Right lung is clear. There is mild atelectasis at the left base. No effusion. No acute bone finding. IMPRESSION: Mild atelectasis at the left base. Electronically Signed   By: Nelson Chimes M.D.   On: 11/20/2016 15:25   Dg Knee 2 Views Right  Result Date:  11/20/2016 CLINICAL DATA:  Pain and swelling RIGHT knee EXAM: RIGHT KNEE - 1-2 VIEW COMPARISON:  None FINDINGS: Osseous demineralization. Joint spaces preserved. Comminuted fracture of the distal RIGHT femoral metaphysis with apex anterior angulation and mild posterior displacement. Intra-articular extension into the intercondylar notch. Associated soft tissue swelling and deformity. No additional fracture, dislocation, or bone destruction. IMPRESSION: Comminuted displaced and angulated fracture of the distal RIGHT femoral metaphysis with intra-articular extension into the RIGHT knee. Osseous demineralization. Electronically Signed   By: Lavonia Dana M.D.   On: 11/20/2016 13:41   Ct Knee Right Wo Contrast  Result Date: 11/20/2016 CLINICAL DATA:  Femoral fracture after fall at dialysis. EXAM: CT OF THE RIGHT KNEE WITHOUT CONTRAST TECHNIQUE: Multidetector CT imaging of the RIGHT knee was performed according to the standard protocol. Multiplanar CT image reconstructions were also generated. COMPARISON:  Radiographs from 11/20/2016 of the right knee FINDINGS: Bones/Joint/Cartilage Acute, comminuted and closed T-shaped supracondylar fracture of the right femur with sagittal fracture component extending through the intercondylar notch into the femorotibial compartment along the medial aspect of the lateral femoral condyle. The fracture involving the lateral femoral condyle causes the articular surface to be disrupted by 4 mm along the anterior weight-bearing portion of the condyles by 4 mm on the sagittal reformats (image 55,series 6) will you. Fracture also extends into the lateral patellofemoral compartment. Lipohemarthrosis is noted. There is 1/4 shaft with displacement of the distal fracture fragment. A nondisplaced sagittally oriented posterior cortical fracture extends more proximally of the diaphysis of the femoral shaft, incompletely included that extends at least to the junction of the middle and distal third  when correlated with same day radiographs. No tibial fracture. Ligaments Suboptimally assessed by CT. Muscles and Tendons Diffuse periarticular intramuscular edema without intramuscular hematoma. Soft tissues Lipohemarthrosis of the knee joint. Periarticular soft tissue edema. IMPRESSION: Acute, closed, comminuted and dorsally displaced T-shaped supracondylar fracture of the right femur extending into the femorotibial and patellofemoral compartments as described with associated lipohemarthrosis. No tibial fracture. There is a nondisplaced posterior diaphyseal fracture of the femoral shaft that extends more proximally at least to the junction of the middle and distal third. The proximal extent is not entirely included on this study of the knee. Electronically Signed   By: Ashley Royalty M.D.   On: 11/20/2016 19:27    Review of Systems  Respiratory: Negative for shortness of breath.   Cardiovascular: Negative for chest pain.   As above  Blood pressure (!) 153/75, pulse 98, temperature 98.4 F (36.9 C), temperature source Oral, resp. rate 16, SpO2 98 %. Physical Exam  Constitutional: He is cooperative. No distress.  Cardiovascular: Regular rhythm.  Tachycardia present.   Patient was slightly tachycardic on my evaluation  Pulmonary/Chest: No respiratory distress.  Abdominal:  Soft, nontender, nondistended,+ bowel sounds  Musculoskeletal:  Right lower extremity Inspection:   Long-leg splint to right leg   Hip is unremarkable   Dystrophic changes to his toenails bilaterally Bony eval:    Splint was not removed however patient is tender to his distal femur    Nontender proximal tibia or lower leg. Ankle is nontender as well Soft tissue:    Swelling appears to be stable to the right lower extremity.     Hip is unremarkable     Feet look symmetric bilaterally     Skin is dry, dystrophic changes noted to his toe nails ROM:    Patient can actively flex his toes without difficulty    Did not  assess hip or knee range of motion Sensation:    DPN, SPN, TN are symmetric to the contralateral side per patient report Motor:    FHL and lesser toe flexion intact    Do not appreciate EHL function but despite patient expecting. His anterior compartment musculature is contracting with effort  Vascular:     Extremities warm, palpable dorsalis pedis pulses noted     No pain with passive stretching  Left lower extremity  No traumatic wounds, ecchymosis, or rash  Nontender  No effusions            No pain with axial loading or  logrolling of the left hip  Knee stable to varus/ valgus and anterior/posterior stress             Ankle nontender  Sens DPN, SPN, TN grossly intact per patient's report  Toe flexion intact. Again on this side and do not really appreciate EHL function but he can perform ankle extension     Neurological: He is alert.  Skin: Skin is warm and dry.      Assessment/Plan  71 year old mentally handicapped male on renal dialysis with comminuted right intra-articular distal femur fracture  -Fall  - Comminuted right intra-articular distal femur fracture  Patient will need surgical intervention to address his fracture.  Given its location and the number of intra-articular components patient will require plate osteosynthesis  Due to his medical comorbidities he is at an increased risk for development of infection and nonunion with surgical intervention does give him the best opportunity to restore his ambulatory function once he is healed  He will be nonweightbearing for 8 weeks postoperatively, unrestricted range of motion postop. I am concerned about his ability to be compliant with weightbearing restrictions given his mental impairment. We may need to put him in a long-leg cast but this is definitely not without risk given his diabetes mellitus and altered mental status   Labs have been reviewed including his H&H, potassium, BUN and creatinine. Case is discussed with  internal medicine, renal and anesthesia. Anesthesia would like to proceed to the OR today as this is an off dialysis day. Patient will receive blood Intra-Op given his low H&H. I would anticipate low blood loss as we plan to do a limited approach.  Patient will be scheduled for dialysis tomorrow. We will however check a postoperative potassium to make sure that his hyperkalemia is not exacerbated by surgery or blood product   I discussed with the nurse that we need to continue to every throughout to his legal guardian to obtain consent  - Pain management:  Titrate accordingly  - ABL anemia/Hemodynamics  2 units of PRBCs have been ordered  - Medical issues   Per medicine renal  - DVT/PE prophylaxis:  Patient on subcutaneous heparin  Currently have an on hold for surgery  - ID:   Perioperative antibiotics  - Metabolic Bone Disease:  Anticipate poor bone quality due to chronic renal disease, diabetes  - FEN/GI prophylaxis/Foley/Lines:  NPO  Renal diet postoperatively   - Impediments to fracture healing:  Chronic renal disease  Diabetes  Ability to comprehend weightbearing restrictions - Dispo:  OR as afternoon for ORIF right distal femur   Jari Pigg, PA-C [facility] [phone] (P) 11/21/2016, 9:35 AM

## 2016-11-21 NOTE — Procedures (Signed)
I have personally attended this patient's dialysis session.   Pt had orders for 1/27, was brought into HD in error However surgeons wanted him to get 2 U blood, so HD continued, pt transfused.  This delayed surgery, required that it be rescheduled. AVF R 300 2K bath Transfusing 2 units  Not sure when surgery rescheduled for. [place], MD [facility] [phone] Pager 11/21/2016, 2:54 PM

## 2016-11-21 NOTE — Progress Notes (Addendum)
PROGRESS NOTE        PATIENT DETAILS Name: Zachary [name] Age: 72 y.o. Sex: male Date of Birth: 10-21-1946 Admit Date: 11/20/2016 Admitting Physician Thurnell Lose, MD AJO:INOMV,EHMCNOB, MD  Brief Narrative: Patient is a 71 y.o. male with history of ESRD on HD TTS, mild mental retardation, type 2 diabetes, hypertension, hypothyroidism who presented to the hospital on 1/25 following a mechanical fall. He was subsequently found to have a right distal femur fracture. He was subsequently admitted for further evaluation and treatment.  Subjective: Lying operatively in bed-denies any chest pain or shortness of breath.  Assessment/Plan: Communicative right intra-articular distal femur fracture: Orthopedic following, and planning surgical intervention later today. As outlined in the H&P, patient remains at moderate risk for the proposed surgical procedure.  Mild hyperkalemia: Refused Kayexalate, plans are for hemodialysis today. Follow electrolytes  Acute blood loss anemia on top of anemia related to ESRD: Suspect blood loss-due to femur fracture-plans to transfuse perioperatively. Follow CBC  ESRD: HD TTS-nephrology consulted  Hypertension: Controlled, continue Coreg and BiDil  Type 2 diabetes: Hold oral hypoglycemic agents-CBG is relatively stable with SSI  Dyslipidemia: Continue statin and niacin  Hypothyroidism: Continue Synthroid  BPH: Continue Flomax  GERD: PPI  DVT Prophylaxis: Prophylactic Heparin  Code Status: Full code   Family Communication: None at bedside  Disposition Plan: Remain inpatient-but will plan SNF on discharge-likely early next week  Antimicrobial agents: Anti-infectives    Start     Dose/Rate Route Frequency Ordered Stop   11/21/16 1600  ceFAZolin (ANCEF) IVPB 1 g/50 mL premix     1 g 100 mL/hr over 30 Minutes Intravenous  Once 11/21/16 1052     11/21/16 0800  ceFAZolin (ANCEF) IVPB 1 g/50 mL premix  Status:   Discontinued     1 g 100 mL/hr over 30 Minutes Intravenous  Once 11/20/16 1705 11/21/16 1052      Procedures: None  CONSULTS:  nephrology and orthopedic surgery  Time spent: 25- minutes-Greater than 50% of this time was spent in counseling, explanation of diagnosis, planning of further management, and coordination of care.  MEDICATIONS: Scheduled Meds: . sodium chloride   Intravenous Once  . carvedilol  6.25 mg Oral BID WC  .  ceFAZolin (ANCEF) IV  1 g Intravenous Once  . [START ON 11/22/2016] darbepoetin (ARANESP) injection - DIALYSIS  150 mcg Intravenous Q Sat-HD  . [START ON 11/22/2016] doxercalciferol  1 mcg Intravenous Q T,Th,Sa-HD  . ferrous sulfate  325 mg Oral Q breakfast  . heparin  5,000 Units Subcutaneous Q8H  . insulin aspart  0-5 Units Subcutaneous QHS  . insulin aspart  0-9 Units Subcutaneous TID WC  . isosorbide-hydrALAZINE  1 tablet Oral TID  . levothyroxine  75 mcg Oral QAC breakfast  . linagliptin  5 mg Oral Daily  . niacin  1,000 mg Oral QHS  . pantoprazole  40 mg Oral Daily  . simvastatin  20 mg Oral QHS  . sodium polystyrene  30 g Oral Q M,W,F  . tamsulosin  0.4 mg Oral Daily   Continuous Infusions: PRN Meds:.HYDROcodone-acetaminophen, metoprolol, morphine injection, ondansetron **OR** ondansetron (ZOFRAN) IV   PHYSICAL EXAM: Vital signs: Vitals:   11/21/16 0615 11/21/16 1210 11/21/16 1232 11/21/16 1300  BP: (!) 153/75 114/63 (!) 108/55 110/64  Pulse: 98 (!) 101 98 99  Resp: 16 16  Temp: 98.4 F (36.9 C) (!) 100.4 F (38 C)    TempSrc: Oral Oral    SpO2: 98% 94%    Weight:  87 kg (191 lb 12.8 oz)     Filed Weights   11/21/16 1210  Weight: 87 kg (191 lb 12.8 oz)   Body mass index is 30.04 kg/m.   General appearance :Awake, alert, not in any distress. Speech Clear. Eyes:, pupils equally reactive to light and accomodation,no scleral icterus. HEENT: Atraumatic and Normocephalic Neck: supple, no JVD. No cervical lymphadenopathy.    Resp:Good air entry bilaterally, no added sounds  CVS: S1 S2 regular, no murmurs.  GI: Bowel sounds present, Non tender and not distended with no gaurding, rigidity or rebound.No organomegaly Extremities: B/L Lower Ext shows no edema, both legs are warm to touch Neurology:  speech clear,Non focal, sensation is grossly intact. Musculoskeletal:No digital cyanosis Skin:No Rash, warm and dry Wounds:N/A  I have personally reviewed following labs and imaging studies  LABORATORY DATA: CBC:  Recent Labs Lab 11/20/16 1446 11/20/16 2303 11/21/16 0556  WBC 8.8 8.0 6.3  NEUTROABS 6.4  --   --   HGB 9.0* 8.5* 7.5*  HCT 28.6* 26.2* 23.0*  MCV 110.9* 106.1* 106.0*  PLT 176 143* 129*    Basic Metabolic Panel:  Recent Labs Lab 11/20/16 1446 11/20/16 2303 11/21/16 0556  NA 140  --  139  K 5.5*  --  5.4*  CL 104  --  105  CO2 28  --  24  GLUCOSE 118*  --  91  BUN 46*  --  48*  CREATININE 5.39* 5.38* 5.50*  CALCIUM 9.0  --  8.7*    GFR: Estimated Creatinine Clearance: 13.5 mL/min (by C-G formula based on SCr of 5.5 mg/dL (H)).  Liver Function Tests:  Recent Labs Lab 11/20/16 1446  AST 27  ALT 33  ALKPHOS 77  BILITOT 0.4  PROT 7.1  ALBUMIN 3.7   No results for input(s): LIPASE, AMYLASE in the last 168 hours. No results for input(s): AMMONIA in the last 168 hours.  Coagulation Profile:  Recent Labs Lab 11/20/16 1446  INR 1.00    Cardiac Enzymes: No results for input(s): CKTOTAL, CKMB, CKMBINDEX, TROPONINI in the last 168 hours.  BNP (last 3 results) No results for input(s): PROBNP in the last 8760 hours.  HbA1C: No results for input(s): HGBA1C in the last 72 hours.  CBG:  Recent Labs Lab 11/20/16 2207 11/21/16 0620 11/21/16 1141  GLUCAP 101* 105* 95    Lipid Profile: No results for input(s): CHOL, HDL, LDLCALC, TRIG, CHOLHDL, LDLDIRECT in the last 72 hours.  Thyroid Function Tests: No results for input(s): TSH, T4TOTAL, FREET4, T3FREE,  THYROIDAB in the last 72 hours.  Anemia Panel: No results for input(s): VITAMINB12, FOLATE, FERRITIN, TIBC, IRON, RETICCTPCT in the last 72 hours.  Urine analysis:    Component Value Date/Time   COLORURINE YELLOW 10/09/2016 0930   APPEARANCEUR TURBID (A) 10/09/2016 0930   LABSPEC 1.025 10/09/2016 0930   PHURINE 6.0 10/09/2016 0930   GLUCOSEU NEGATIVE 10/09/2016 0930   HGBUR LARGE (A) 10/09/2016 0930   BILIRUBINUR NEGATIVE 10/09/2016 0930   KETONESUR TRACE (A) 10/09/2016 0930   PROTEINUR 100 (A) 10/09/2016 0930   UROBILINOGEN 0.2 07/20/2015 1850   NITRITE NEGATIVE 10/09/2016 0930   LEUKOCYTESUR LARGE (A) 10/09/2016 0930    Sepsis Labs: Lactic Acid, Venous    Component Value Date/Time   LATICACIDVEN 0.51 03/10/2016 0201    MICROBIOLOGY: Recent Results (from the  past 240 hour(s))  Surgical PCR screen     Status: None   Collection Time: 11/21/16 12:05 AM  Result Value Ref Range Status   MRSA, PCR NEGATIVE NEGATIVE Final   Staphylococcus aureus NEGATIVE NEGATIVE Final    Comment:        The Xpert SA Assay (FDA approved for NASAL specimens in patients over 36 years of age), is one component of a comprehensive surveillance program.  Test performance has been validated by [facility] for patients greater than or equal to 72 year old. It is not intended to diagnose infection nor to guide or monitor treatment.     RADIOLOGY STUDIES/RESULTS: Dg Chest 1 View  Result Date: 11/20/2016 CLINICAL DATA:  Femur fracture. Shortness of breath. Preoperative evaluation. EXAM: CHEST 1 VIEW COMPARISON:  03/06/2016 FINDINGS: Heart size is normal. Mediastinal shadows are normal. Right lung is clear. There is mild atelectasis at the left base. No effusion. No acute bone finding. IMPRESSION: Mild atelectasis at the left base. Electronically Signed   By: Nelson Chimes M.D.   On: 11/20/2016 15:25   Dg Knee 2 Views Right  Result Date: 11/20/2016 CLINICAL DATA:  Pain and swelling RIGHT knee  EXAM: RIGHT KNEE - 1-2 VIEW COMPARISON:  None FINDINGS: Osseous demineralization. Joint spaces preserved. Comminuted fracture of the distal RIGHT femoral metaphysis with apex anterior angulation and mild posterior displacement. Intra-articular extension into the intercondylar notch. Associated soft tissue swelling and deformity. No additional fracture, dislocation, or bone destruction. IMPRESSION: Comminuted displaced and angulated fracture of the distal RIGHT femoral metaphysis with intra-articular extension into the RIGHT knee. Osseous demineralization. Electronically Signed   By: Lavonia Dana M.D.   On: 11/20/2016 13:41   Ct Knee Right Wo Contrast  Result Date: 11/20/2016 CLINICAL DATA:  Femoral fracture after fall at dialysis. EXAM: CT OF THE RIGHT KNEE WITHOUT CONTRAST TECHNIQUE: Multidetector CT imaging of the RIGHT knee was performed according to the standard protocol. Multiplanar CT image reconstructions were also generated. COMPARISON:  Radiographs from 11/20/2016 of the right knee FINDINGS: Bones/Joint/Cartilage Acute, comminuted and closed T-shaped supracondylar fracture of the right femur with sagittal fracture component extending through the intercondylar notch into the femorotibial compartment along the medial aspect of the lateral femoral condyle. The fracture involving the lateral femoral condyle causes the articular surface to be disrupted by 4 mm along the anterior weight-bearing portion of the condyles by 4 mm on the sagittal reformats (image 55,series 6) will you. Fracture also extends into the lateral patellofemoral compartment. Lipohemarthrosis is noted. There is 1/4 shaft with displacement of the distal fracture fragment. A nondisplaced sagittally oriented posterior cortical fracture extends more proximally of the diaphysis of the femoral shaft, incompletely included that extends at least to the junction of the middle and distal third when correlated with same day radiographs. No tibial  fracture. Ligaments Suboptimally assessed by CT. Muscles and Tendons Diffuse periarticular intramuscular edema without intramuscular hematoma. Soft tissues Lipohemarthrosis of the knee joint. Periarticular soft tissue edema. IMPRESSION: Acute, closed, comminuted and dorsally displaced T-shaped supracondylar fracture of the right femur extending into the femorotibial and patellofemoral compartments as described with associated lipohemarthrosis. No tibial fracture. There is a nondisplaced posterior diaphyseal fracture of the femoral shaft that extends more proximally at least to the junction of the middle and distal third. The proximal extent is not entirely included on this study of the knee. Electronically Signed   By: Ashley Royalty M.D.   On: 11/20/2016 19:27     LOS: 1  day   Oren Binet, MD  [facility] Pager:[phone]  If 7PM-7AM, please contact night-coverage [email or web address] Password [email or web address] 11/21/2016, 1:18 PM

## 2016-11-21 NOTE — Progress Notes (Signed)
[facility]  Case rescheduled for tomorrow  Surgery will be performed by Dr. Lyla Glassing   Npo after MN  Consent obtained from legal guardian. Need to reach out again to inform of change in surgeon to expedite pts care  Labs tomorrow am per Renal service  Jari Pigg, PA-C [facility] [phone] (P) 11/21/2016 4:05 PM

## 2016-11-21 NOTE — Progress Notes (Signed)
[facility] Brief Note (full note to follow)  Consult called for pt on HD TTS (Dr. Hinda Lenis) transferred from [facility] with distal femur fx for surgery today Had HD yesterday K 5.4/Hb 7.5 Anesthesia to determine if requires HD/transfusion pre op  Dialysis Prescription TTS [facility] 4 hours 300/600 2K 2.5 Ca 2000 heparin bolus, 500/hour AVF 15 ga needles EDW 80 kg EPO 4800 TIS Hectorol 1 mcg TIW  Await further word from [place], [place] Pager 11/21/2016, 9:14 AM

## 2016-11-21 NOTE — Progress Notes (Signed)
[facility] Brief Note  Orders for HD were written for tomorrow Dialysis nurses brought him into HD IN ERROR No heparin has been administered  Appears is on the OR schedule for 4PM Will try to confirm surgery time, determine if adequate time to get his transfusion done and discontinue the treatment so as not to hold up the OR schedule  Jamal Maes, MD [place] Pager 11/21/2016, 1:19 PM

## 2016-11-21 NOTE — Consult Note (Signed)
[facility] Consultation Note Requesting Physician:  Dr. Sloan Leiter Primary Nephrologist: Dr. Hinda Lenis Reason for Consult:  ESRD - provision of HD and related services  HPI: The patient is a 71 y.o. year-old 71 yo mentally retarded AAM with ESRD on TTS HD at [facility], DM, HTN, HLD, hypothyroidism who fell after dialysis on Thursday and sustained a complex fracture to his R distal femur. Transferred here for surgery, we are seeing to provide his dialysis. Resides in SNF, has legal guardian. He was seen in the dialysis unit where he appeared comfortable, denied CP, SOB, leg pain controlled.   Past Medical History:  Diagnosis Date  . Abnormal CT scan, kidney 10/06/2011  . Acute pyelonephritis 10/07/2011  . Anemia    normocytic  . Anxiety    mental retardation  . Bladder wall thickening 10/06/2011  . BPH (benign prostatic hypertrophy)   . Diabetes mellitus   . Edema     history of lower extremity edema  . Heme positive stool   . Hydronephrosis   . Hyperkalemia   . Hyperlipidemia   . Hypernatremia   . Hypertension   . Hypothyroidism   . Impaired speech   . 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 MR (mental retardation)   . Obstructive uropathy   . 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 Poor historian poor historian  . Protein calorie malnutrition ([place])   . Pyelonephritis   . Renal failure (ARF), acute on chronic (HCC)   . Renal insufficiency    chronic history  . Smoking   . Uremia   . Urinary retention   . UTI (lower urinary tract infection) 10/06/2011  . UTI (lower urinary tract infection)     Past Surgical History:  Procedure Laterality Date  . AV FISTULA PLACEMENT Left 07/06/2015   Procedure:  INSERTION LEFT ARM ARTERIOVENOUS GORTEX GRAFT;  Surgeon: Angelia Mould, MD;  Location: [place];  Service: Vascular;  Laterality: Left;  . AV FISTULA PLACEMENT Right 02/26/2016   Procedure: ARTERIOVENOUS (AV) FISTULA CREATION ;  Surgeon: Angelia Mould, MD;  Location: [place];  Service: Vascular;  Laterality: Right;  . [place] REMOVAL Left 10/09/2015   Procedure: REMOVAL OF ARTERIOVENOUS GORETEX GRAFT ([place]) Evacuation of Lymphocele, Vein Patch angioplasty of brachial artery.;  Surgeon: Angelia Mould, MD;  Location: [place];  Service: Vascular;  Laterality: Left;  . BASCILIC VEIN TRANSPOSITION Right 02/26/2016   Procedure: Right BASCILIC VEIN TRANSPOSITION;  Surgeon: Angelia Mould, MD;  Location: [place];  Service: Vascular;  Laterality: Right;  . CIRCUMCISION N/A 01/04/2014   Procedure: CIRCUMCISION ADULT (procedure #1);  Surgeon: Marissa Nestle, MD;  Location: [facility];  Service: Urology;  Laterality: N/A;  . CYSTOSCOPY W/ RETROGRADES Bilateral 06/29/2015   Procedure: CYSTOSCOPY, DILATION OF URETHRAL STRICTURE WITH BILATERAL RETROGRADE PYELOGRAM,SUPRAPUBIC TUBE CHANGE;  Surgeon: Festus Aloe, MD;  Location: [facility];  Service: Urology;  Laterality: Bilateral;  . CYSTOSCOPY WITH URETHRAL DILATATION N/A 12/29/2013   Procedure: CYSTOSCOPY WITH URETHRAL DILATATION;  Surgeon: Marissa Nestle, MD;  Location: [facility];  Service: Urology;  Laterality: N/A;  . PERIPHERAL VASCULAR CATHETERIZATION N/A 10/08/2015   Procedure: A/V Shuntogram;  Surgeon: Angelia Mould, MD;  Location: [place] [facility];  Service: Cardiovascular;  Laterality: N/A;  . TRANSURETHRAL RESECTION OF PROSTATE N/A 01/04/2014   Procedure: TRANSURETHRAL RESECTION OF THE PROSTATE (TURP) (procedure #2);  Surgeon: Marissa Nestle, MD;  Location: [facility];  Service: Urology;  Laterality: N/A;     Family History  Problem Relation Age of Onset  . Cancer Mother    Social History:  reports that he has never smoked. He has never used smokeless tobacco. He reports that he does not drink alcohol or use drugs.  Allergies: No Known Allergies  Home medications: Prior to Admission medications   Medication Sig Start Date End Date Taking? Authorizing Provider  atorvastatin (LIPITOR) 40 MG tablet Take  40 mg by mouth every evening.   Yes Historical Provider, MD  carvedilol (COREG) 6.25 MG tablet Take 1 tablet (6.25 mg total) by mouth 2 (two) times daily with a meal. 03/08/15  Yes [name], MD  epoetin alfa (EPOGEN,PROCRIT) 4000 UNIT/ML injection Inject 4,000 Units into the vein every 14 (fourteen) days.   Yes Historical Provider, MD  ferrous sulfate 325 (65 FE) MG tablet Take 325 mg by mouth 2 (two) times daily with a meal.    Yes Historical Provider, MD  isosorbide-hydrALAZINE (BIDIL) 20-37.5 MG per tablet Take 1 tablet by mouth 3 (three) times daily. 03/08/15  Yes [name], MD  levothyroxine (SYNTHROID, LEVOTHROID) 75 MCG tablet Take 75 mcg by mouth daily before breakfast.    Yes Historical Provider, MD  linagliptin (TRADJENTA) 5 MG TABS tablet Take 5 mg by mouth daily.   Yes Historical Provider, MD  Multiple Vitamin (MULTIVITAMIN WITH MINERALS) TABS tablet Take 1 tablet by mouth daily.   Yes Historical Provider, MD  Neo-Bacit-Poly-Lidocaine (FIRST AID PLUS LIDOCAINE EX) Apply 1 application topically 3 (three) times a week. Prior to dialysis   Yes Historical Provider, MD  omeprazole (PRILOSEC) 20 MG capsule Take 40 mg by mouth daily.   Yes Historical Provider, MD  prednisoLONE acetate (PRED FORTE) 1 % ophthalmic suspension Place 1 drop into the left eye 4 (four) times daily. 2 week course to be completed 11/20/2016   Yes Historical Provider, MD  tamsulosin (FLOMAX) 0.4 MG CAPS capsule Take 0.4 mg by mouth daily.   Yes Historical Provider, MD  torsemide (DEMADEX) 10 MG tablet Take 5 tablets (50 mg total) by mouth daily. 04/07/16  Yes [name], MD  Vitamin D, Ergocalciferol, (DRISDOL) 50000 UNITS CAPS capsule Take 1 capsule (50,000 Units total) by mouth every 7 (seven) days. Patient taking differently: Take 50,000 Units by mouth every Monday.  03/08/15  Yes [name], MD    Inpatient medications: . sodium chloride   Intravenous Once  . carvedilol  6.25 mg Oral BID WC  . [START ON  11/22/2016]  ceFAZolin (ANCEF) IV  1 g Intravenous Once  . [START ON 11/22/2016] darbepoetin (ARANESP) injection - DIALYSIS  150 mcg Intravenous Q Sat-HD  . [START ON 11/22/2016] doxercalciferol  1 mcg Intravenous Q T,Th,Sa-HD  . ferrous sulfate  325 mg Oral Q breakfast  . heparin  5,000 Units Subcutaneous Q8H  . insulin aspart  0-5 Units Subcutaneous QHS  . insulin aspart  0-9 Units Subcutaneous TID WC  . isosorbide-hydrALAZINE  1 tablet Oral TID  . levothyroxine  75 mcg Oral QAC breakfast  . niacin  1,000 mg Oral QHS  . pantoprazole  40 mg Oral Daily  . simvastatin  20 mg Oral QHS  . sodium polystyrene  30 g Oral Q M,W,F  . tamsulosin  0.4 mg Oral Daily    Review of Systems Per HPI  Physical Exam:  Blood pressure (!) 101/48, pulse (!) 110, temperature 99.2 F (37.3 C), temperature source Oral, resp. rate 18, weight 85.3 kg (188 lb 0.8 oz), SpO2 96 %.  Gen: Very nice AAM  NAD VS as noted Lungs clear S1S2 No S3 No murmur Abd soft and NT RLE wrapped extensively in ACE wrap w/splinting  RUE AVF good bruit and thrill  Labs:  Recent Labs Lab 11/20/16 1446 11/20/16 2303 11/21/16 0556 11/21/16 1325  NA 140  --  139 139  K 5.5*  --  5.4* 5.6*  CL 104  --  105 106  CO2 28  --  24 24  GLUCOSE 118*  --  91 96  BUN 46*  --  48* 49*  CREATININE 5.39* 5.38* 5.50* 5.75*  CALCIUM 9.0  --  8.7* 8.5*  PHOS  --   --   --  5.0*    Recent Labs Lab 11/20/16 1446 11/21/16 1325  AST 27  --   ALT 33  --   ALKPHOS 77  --   BILITOT 0.4  --   PROT 7.1  --   ALBUMIN 3.7 2.8*    Recent Labs Lab 11/20/16 1446 11/20/16 2303 11/21/16 0556  WBC 8.8 8.0 6.3  NEUTROABS 6.4  --   --   HGB 9.0* 8.5* 7.5*  HCT 28.6* 26.2* 23.0*  MCV 110.9* 106.1* 106.0*  PLT 176 143* 129*    Recent Labs Lab 11/20/16 2207 11/21/16 0620 11/21/16 1141 11/21/16 1757 11/21/16 2044  GLUCAP 101* 105* 95 92 99    Xrays/Other Studies: Dg Chest 1 View  Result Date: 11/20/2016 CLINICAL DATA:  Femur  fracture. Shortness of breath. Preoperative evaluation. EXAM: CHEST 1 VIEW COMPARISON:  03/06/2016 FINDINGS: Heart size is normal. Mediastinal shadows are normal. Right lung is clear. There is mild atelectasis at the left base. No effusion. No acute bone finding. IMPRESSION: Mild atelectasis at the left base. Electronically Signed   By: Nelson Chimes M.D.   On: 11/20/2016 15:25   Dg Knee 2 Views Right  Result Date: 11/20/2016 CLINICAL DATA:  Pain and swelling RIGHT knee EXAM: RIGHT KNEE - 1-2 VIEW COMPARISON:  None FINDINGS: Osseous demineralization. Joint spaces preserved. Comminuted fracture of the distal RIGHT femoral metaphysis with apex anterior angulation and mild posterior displacement. Intra-articular extension into the intercondylar notch. Associated soft tissue swelling and deformity. No additional fracture, dislocation, or bone destruction. IMPRESSION: Comminuted displaced and angulated fracture of the distal RIGHT femoral metaphysis with intra-articular extension into the RIGHT knee. Osseous demineralization. Electronically Signed   By: Lavonia Dana M.D.   On: 11/20/2016 13:41   Ct Knee Right Wo Contrast  Result Date: 11/20/2016 CLINICAL DATA:  Femoral fracture after fall at dialysis. EXAM: CT OF THE RIGHT KNEE WITHOUT CONTRAST TECHNIQUE: Multidetector CT imaging of the RIGHT knee was performed according to the standard protocol. Multiplanar CT image reconstructions were also generated. COMPARISON:  Radiographs from 11/20/2016 of the right knee FINDINGS: Bones/Joint/Cartilage Acute, comminuted and closed T-shaped supracondylar fracture of the right femur with sagittal fracture component extending through the intercondylar notch into the femorotibial compartment along the medial aspect of the lateral femoral condyle. The fracture involving the lateral femoral condyle causes the articular surface to be disrupted by 4 mm along the anterior weight-bearing portion of the condyles by 4 mm on the sagittal  reformats (image 55,series 6) will you. Fracture also extends into the lateral patellofemoral compartment. Lipohemarthrosis is noted. There is 1/4 shaft with displacement of the distal fracture fragment. A nondisplaced sagittally oriented posterior cortical fracture extends more proximally of the diaphysis of the femoral shaft, incompletely included that extends at least to the junction of the middle and distal third when  correlated with same day radiographs. No tibial fracture. Ligaments Suboptimally assessed by CT. Muscles and Tendons Diffuse periarticular intramuscular edema without intramuscular hematoma. Soft tissues Lipohemarthrosis of the knee joint. Periarticular soft tissue edema. IMPRESSION: Acute, closed, comminuted and dorsally displaced T-shaped supracondylar fracture of the right femur extending into the femorotibial and patellofemoral compartments as described with associated lipohemarthrosis. No tibial fracture. There is a nondisplaced posterior diaphyseal fracture of the femoral shaft that extends more proximally at least to the junction of the middle and distal third. The proximal extent is not entirely included on this study of the knee. Electronically Signed   By: Ashley Royalty M.D.   On: 11/20/2016 19:27   Dialysis Prescription: TTS [facility] 4 hours 300/600 2K2.5 Ca 2000 heparin bolus, 500/hour 138 linear 37 degrees EDW 80 kg EPO 4800 TIW Hectorol 1 mcg TIW   Background: 71 yo mentally retarded AAM with ESRD on TTS HD at [facility], who fell after dialysis on Thursday and sustained a complex fracture to his R distal femur. Transferred here for surgery, we are seeing to provide his dialysis. Resides in SNF, has legal guardian  Assessment/Recommendations  1. ESRD - Hyperkalemic. Refused Kayexalate. Although original plan was for surgery today and HD on 1/27, was inadvertently taken to HD today. Surgery delayed. However was able to be transfused 2 units and K/Hb likely  now more appropriate for surgery. Check labs in AM. Off schedule. 2. Comminuted R femur fracture - now for surgery on Saturday 3. Anemia - ABLA + ESRD. Converted EPO to Aranesp (at higher dose equivalent). Transfused 2 units on HD today. Trend labs 4. DM - per primary 5. Hypothyroidism - on replacement 6. HLD - statin 7. Mental retardation - SNF and has legal guardian  Jamal Maes,  MD [facility] [phone] pager 11/21/2016, 10:21 PM

## 2016-11-21 NOTE — Plan of Care (Signed)
Problem: Safety: Goal: Ability to remain free from injury will improve Outcome: Progressing Safety precautions maintained  Problem: Pain Managment: Goal: General experience of comfort will improve Outcome: Progressing Denies pain at present time  Problem: Physical Regulation: Goal: Will remain free from infection Outcome: Progressing No S/S of infection noted  Problem: Skin Integrity: Goal: Risk for impaired skin integrity will decrease Outcome: Progressing No skin issues noted  Problem: Tissue Perfusion: Goal: Risk factors for ineffective tissue perfusion will decrease Outcome: Progressing On Heparin SQ, no s/s of dvt noted  Problem: Nutrition: Goal: Adequate nutrition will be maintained Outcome: Progressing Poor appetite, patient was encouraged to eat more, given graham crackers and apple sauce  Problem: Bowel/Gastric: Goal: Will not experience complications related to bowel motility Outcome: Progressing Denies gastric and bowel problems

## 2016-11-22 ENCOUNTER — Inpatient Hospital Stay (HOSPITAL_COMMUNITY): Payer: Medicare Other | Admitting: Certified Registered Nurse Anesthetist

## 2016-11-22 ENCOUNTER — Inpatient Hospital Stay (HOSPITAL_COMMUNITY): Payer: Medicare Other

## 2016-11-22 ENCOUNTER — Encounter (HOSPITAL_COMMUNITY)
Admission: EM | Disposition: A | Discharge status: Skilled Nursing Facility | Payer: Self-pay | Source: Home / Self Care | Attending: Internal Medicine

## 2016-11-22 DIAGNOSIS — S72491A Other fracture of lower end of right femur, initial encounter for closed fracture: Secondary | ICD-10-CM | POA: Diagnosis present

## 2016-11-22 DIAGNOSIS — D62 Acute posthemorrhagic anemia: Secondary | ICD-10-CM

## 2016-11-22 HISTORY — PX: ORIF FEMUR FRACTURE: SHX2119

## 2016-11-22 LAB — TYPE AND SCREEN
Blood Product Expiration Date: 201802192359
Blood Product Expiration Date: 201802202359
ISSUE DATE / TIME: 201801261336
ISSUE DATE / TIME: 201801261444
Unit Type and Rh: 7300
Unit Type and Rh: 7300

## 2016-11-22 LAB — HEMOGLOBIN A1C
Hgb A1c MFr Bld: 5 % (ref 4.8–5.6)
Mean Plasma Glucose: 97 mg/dL

## 2016-11-22 LAB — CBC
HCT: 29.9 % — ABNORMAL LOW (ref 39.0–52.0)
Hemoglobin: 9.7 g/dL — ABNORMAL LOW (ref 13.0–17.0)
MCH: 32.9 pg (ref 26.0–34.0)
MCHC: 32.4 g/dL (ref 30.0–36.0)
MCV: 101.4 fL — ABNORMAL HIGH (ref 78.0–100.0)
Platelets: 125 10*3/uL — ABNORMAL LOW (ref 150–400)
RBC: 2.95 MIL/uL — ABNORMAL LOW (ref 4.22–5.81)
RDW: 20.3 % — ABNORMAL HIGH (ref 11.5–15.5)
WBC: 9.2 10*3/uL (ref 4.0–10.5)

## 2016-11-22 LAB — GLUCOSE, CAPILLARY
Glucose-Capillary: 102 mg/dL — ABNORMAL HIGH (ref 65–99)
Glucose-Capillary: 100 mg/dL — ABNORMAL HIGH (ref 65–99)
Glucose-Capillary: 117 mg/dL — ABNORMAL HIGH (ref 65–99)
Glucose-Capillary: 124 mg/dL — ABNORMAL HIGH (ref 65–99)

## 2016-11-22 LAB — RENAL FUNCTION PANEL
Albumin: 2.9 g/dL — ABNORMAL LOW (ref 3.5–5.0)
Anion gap: 11 (ref 5–15)
BUN: 28 mg/dL — ABNORMAL HIGH (ref 6–20)
CO2: 28 mmol/L (ref 22–32)
Calcium: 8.9 mg/dL (ref 8.9–10.3)
Chloride: 100 mmol/L — ABNORMAL LOW (ref 101–111)
Creatinine, Ser: 4.58 mg/dL — ABNORMAL HIGH (ref 0.61–1.24)
GFR calc Af Amer: 14 mL/min — ABNORMAL LOW (ref >60)
GFR calc non Af Amer: 12 mL/min — ABNORMAL LOW (ref >60)
Glucose, Bld: 110 mg/dL — ABNORMAL HIGH (ref 65–99)
Phosphorus: 4.9 mg/dL — ABNORMAL HIGH (ref 2.5–4.6)
Potassium: 4.4 mmol/L (ref 3.5–5.1)
Sodium: 139 mmol/L (ref 135–145)

## 2016-11-22 SURGERY — OPEN REDUCTION INTERNAL FIXATION (ORIF) DISTAL FEMUR FRACTURE
Anesthesia: General | Site: Leg Upper | Laterality: Right

## 2016-11-22 MED ORDER — ONDANSETRON HCL 4 MG/2ML IJ SOLN: 4.0000 mg | Freq: Four times a day (QID) | INTRAMUSCULAR | Status: DC | PRN

## 2016-11-22 MED ORDER — PHENYLEPHRINE HCL 10 MG/ML IJ SOLN
INTRAVENOUS | Status: DC | PRN
  Administered 2016-11-22: 50 ug/min via INTRAVENOUS

## 2016-11-22 MED ORDER — WARFARIN SODIUM 5 MG PO TABS
5.0000 mg | ORAL_TABLET | Freq: Once | ORAL | Status: AC
  Administered 2016-11-22: 5 mg via ORAL
  Filled 2016-11-22: qty 1

## 2016-11-22 MED ORDER — ATORVASTATIN CALCIUM 40 MG PO TABS
40.0000 mg | ORAL_TABLET | Freq: Every evening | ORAL | Status: DC
  Administered 2016-11-22 – 2016-11-25 (×4): 40 mg via ORAL
  Filled 2016-11-22 (×4): qty 1

## 2016-11-22 MED ORDER — ONDANSETRON HCL 4 MG/2ML IJ SOLN
INTRAMUSCULAR | Status: DC | PRN
Start: 2016-11-22 — End: 2016-11-22
  Administered 2016-11-22: 4 mg via INTRAVENOUS

## 2016-11-22 MED ORDER — FENTANYL CITRATE (PF) 100 MCG/2ML IJ SOLN
INTRAMUSCULAR | Status: AC
  Filled 2016-11-22: qty 4

## 2016-11-22 MED ORDER — WARFARIN - PHARMACIST DOSING INPATIENT
Freq: Every day | Status: DC
  Administered 2016-11-22: 1
  Administered 2016-11-24: 18:00:00

## 2016-11-22 MED ORDER — METOCLOPRAMIDE HCL 5 MG/ML IJ SOLN: 5.0000 mg | Freq: Three times a day (TID) | INTRAMUSCULAR | Status: DC | PRN

## 2016-11-22 MED ORDER — PHENOL 1.4 % MT LIQD: 1.0000 | OROMUCOSAL | Status: DC | PRN

## 2016-11-22 MED ORDER — LIDOCAINE HCL (CARDIAC) 20 MG/ML IV SOLN
INTRAVENOUS | Status: DC | PRN
  Administered 2016-11-22: 100 mg via INTRAVENOUS

## 2016-11-22 MED ORDER — SODIUM CHLORIDE 0.9 % IR SOLN
Status: DC | PRN
  Administered 2016-11-22: 1000 mL

## 2016-11-22 MED ORDER — FENTANYL CITRATE (PF) 100 MCG/2ML IJ SOLN: 25.0000 ug | INTRAMUSCULAR | Status: DC | PRN

## 2016-11-22 MED ORDER — ALBUMIN HUMAN 5 % IV SOLN
INTRAVENOUS | Status: DC | PRN
  Administered 2016-11-22: 12:00:00 via INTRAVENOUS

## 2016-11-22 MED ORDER — PHENYLEPHRINE HCL 10 MG/ML IJ SOLN
INTRAMUSCULAR | Status: DC | PRN
  Administered 2016-11-22: 120 ug via INTRAVENOUS
  Administered 2016-11-22: 80 ug via INTRAVENOUS
  Administered 2016-11-22: 160 ug via INTRAVENOUS

## 2016-11-22 MED ORDER — ONDANSETRON HCL 4 MG PO TABS: 4.0000 mg | ORAL_TABLET | Freq: Four times a day (QID) | ORAL | Status: DC | PRN

## 2016-11-22 MED ORDER — METOCLOPRAMIDE HCL 5 MG PO TABS: 5.0000 mg | ORAL_TABLET | Freq: Three times a day (TID) | ORAL | Status: DC | PRN

## 2016-11-22 MED ORDER — ACETAMINOPHEN 650 MG RE SUPP: 650.0000 mg | Freq: Four times a day (QID) | RECTAL | Status: DC | PRN

## 2016-11-22 MED ORDER — 0.9 % SODIUM CHLORIDE (POUR BTL) OPTIME
TOPICAL | Status: DC | PRN
  Administered 2016-11-22: 1000 mL

## 2016-11-22 MED ORDER — MIDAZOLAM HCL 2 MG/2ML IJ SOLN
INTRAMUSCULAR | Status: AC
  Filled 2016-11-22: qty 2

## 2016-11-22 MED ORDER — NEOSTIGMINE METHYLSULFATE 10 MG/10ML IV SOLN
INTRAVENOUS | Status: DC | PRN
  Administered 2016-11-22: 4 mg via INTRAVENOUS

## 2016-11-22 MED ORDER — FENTANYL CITRATE (PF) 100 MCG/2ML IJ SOLN
INTRAMUSCULAR | Status: AC
Start: 2016-11-22 — End: 2016-11-22
  Filled 2016-11-22: qty 2

## 2016-11-22 MED ORDER — PROPOFOL 10 MG/ML IV BOLUS
INTRAVENOUS | Status: AC
  Filled 2016-11-22: qty 20

## 2016-11-22 MED ORDER — MENTHOL 3 MG MT LOZG: 1.0000 | LOZENGE | OROMUCOSAL | Status: DC | PRN

## 2016-11-22 MED ORDER — ACETAMINOPHEN 325 MG PO TABS
650.0000 mg | ORAL_TABLET | Freq: Four times a day (QID) | ORAL | Status: DC | PRN
Start: 2016-11-22 — End: 2016-11-26
  Administered 2016-11-22 – 2016-11-24 (×2): 650 mg via ORAL
  Filled 2016-11-22 (×3): qty 2

## 2016-11-22 MED ORDER — ROCURONIUM BROMIDE 100 MG/10ML IV SOLN
INTRAVENOUS | Status: DC | PRN
  Administered 2016-11-22: 40 mg via INTRAVENOUS

## 2016-11-22 MED ORDER — GLYCOPYRROLATE 0.2 MG/ML IJ SOLN
INTRAMUSCULAR | Status: DC | PRN
  Administered 2016-11-22: .6 mg via INTRAVENOUS

## 2016-11-22 MED ORDER — CEFAZOLIN SODIUM-DEXTROSE 2-4 GM/100ML-% IV SOLN
2.0000 g | Freq: Two times a day (BID) | INTRAVENOUS | Status: AC
  Administered 2016-11-23 (×2): 2 g via INTRAVENOUS
  Filled 2016-11-22 (×2): qty 100

## 2016-11-22 MED ORDER — SODIUM CHLORIDE 0.9 % IV SOLN
INTRAVENOUS | Status: DC | PRN
  Administered 2016-11-22 (×2): via INTRAVENOUS

## 2016-11-22 MED ORDER — FENTANYL CITRATE (PF) 100 MCG/2ML IJ SOLN
INTRAMUSCULAR | Status: DC | PRN
  Administered 2016-11-22: 100 ug via INTRAVENOUS
  Administered 2016-11-22 (×4): 50 ug via INTRAVENOUS

## 2016-11-22 MED ORDER — PROPOFOL 10 MG/ML IV BOLUS
INTRAVENOUS | Status: DC | PRN
  Administered 2016-11-22: 50 mg via INTRAVENOUS
  Administered 2016-11-22: 120 mg via INTRAVENOUS

## 2016-11-22 SURGICAL SUPPLY — 89 items
ADH SKN CLS APL DERMABOND .7 (GAUZE/BANDAGES/DRESSINGS) ×1
BANDAGE ACE 4X5 VEL STRL LF (GAUZE/BANDAGES/DRESSINGS) ×2 IMPLANT
BANDAGE ACE 6X5 VEL STRL LF (GAUZE/BANDAGES/DRESSINGS) ×2 IMPLANT
BIT DRILL 4.3 (BIT) ×6 IMPLANT
BIT DRILL 4.3X300MM (BIT) ×2 IMPLANT
BIT DRILL TWST CANN 4.0X150 AO (DRILL) ×1 IMPLANT
BLADE SURG ROTATE 9660 (MISCELLANEOUS) ×2 IMPLANT
BNDG GAUZE ELAST 4 BULKY (GAUZE/BANDAGES/DRESSINGS) IMPLANT
BRUSH SCRUB DISP (MISCELLANEOUS) IMPLANT
CANISTER SUCT 3000ML PPV (MISCELLANEOUS) IMPLANT
CAP LOCK NCB (Cap) ×10 IMPLANT
COVER SURGICAL LIGHT HANDLE (MISCELLANEOUS) ×2 IMPLANT
DERMABOND ADVANCED (GAUZE/BANDAGES/DRESSINGS) ×1
DERMABOND ADVANCED .7 DNX12 (GAUZE/BANDAGES/DRESSINGS) ×1 IMPLANT
DRAPE C-ARM 42X72 X-RAY (DRAPES) ×2 IMPLANT
DRAPE C-ARMOR (DRAPES) ×2 IMPLANT
DRAPE IMP U-DRAPE 54X76 (DRAPES) ×2 IMPLANT
DRAPE ORTHO SPLIT 77X108 STRL (DRAPES) ×6
DRAPE SURG ORHT 6 SPLT 77X108 (DRAPES) ×3 IMPLANT
DRAPE U-SHAPE 47X51 STRL (DRAPES) ×2 IMPLANT
DRILL BIT 4.3 (BIT) ×4
DRILL TWIST CANN 4.0X150 AO (DRILL) ×2
DRSG ADAPTIC 3X8 NADH LF (GAUZE/BANDAGES/DRESSINGS) IMPLANT
DRSG AQUACEL AG ADV 3.5X 6 (GAUZE/BANDAGES/DRESSINGS) ×2 IMPLANT
DRSG AQUACEL AG ADV 3.5X14 (GAUZE/BANDAGES/DRESSINGS) ×2 IMPLANT
DRSG PAD ABDOMINAL 8X10 ST (GAUZE/BANDAGES/DRESSINGS) IMPLANT
ELECT CAUTERY BLADE 6.4 (BLADE) ×2 IMPLANT
ELECT REM PT RETURN 9FT ADLT (ELECTROSURGICAL) ×2
ELECTRODE REM PT RTRN 9FT ADLT (ELECTROSURGICAL) ×1 IMPLANT
EVACUATOR 1/8 PVC DRAIN (DRAIN) IMPLANT
EVACUATOR 3/16  PVC DRAIN (DRAIN)
EVACUATOR 3/16 PVC DRAIN (DRAIN) IMPLANT
GAUZE SPONGE 4X4 12PLY STRL (GAUZE/BANDAGES/DRESSINGS) IMPLANT
GLOVE BIO SURGEON STRL SZ7.5 (GLOVE) ×4 IMPLANT
GLOVE BIO SURGEON STRL SZ8 (GLOVE) IMPLANT
GLOVE BIOGEL PI IND STRL 7.5 (GLOVE) ×1 IMPLANT
GLOVE BIOGEL PI IND STRL 8 (GLOVE) ×1 IMPLANT
GLOVE BIOGEL PI INDICATOR 7.5 (GLOVE) ×1
GLOVE BIOGEL PI INDICATOR 8 (GLOVE) ×1
GOWN STRL REUS W/ TWL LRG LVL3 (GOWN DISPOSABLE) ×2 IMPLANT
GOWN STRL REUS W/ TWL XL LVL3 (GOWN DISPOSABLE) ×2 IMPLANT
GOWN STRL REUS W/TWL LRG LVL3 (GOWN DISPOSABLE) ×4
GOWN STRL REUS W/TWL XL LVL3 (GOWN DISPOSABLE) ×4
HEAD CERAMIC DELTA 36 PLUS 1.5 (Hips) ×2 IMPLANT
K-WIRE 2.0 (WIRE) ×2
K-WIRE FXSTD 280X2XNS SS (WIRE) ×1
K-WIRE TROCAR 1.6X180 (WIRE) ×4
KIT BASIN OR (CUSTOM PROCEDURE TRAY) ×2 IMPLANT
KIT ROOM TURNOVER OR (KITS) ×2 IMPLANT
KWIRE FXSTD 280X2XNS SS (WIRE) ×1 IMPLANT
KWIRE TROCAR 1.6X180 (WIRE) ×2 IMPLANT
NEEDLE 22X1 1/2 (OR ONLY) (NEEDLE) IMPLANT
NS IRRIG 1000ML POUR BTL (IV SOLUTION) ×2 IMPLANT
PACK TOTAL JOINT (CUSTOM PROCEDURE TRAY) ×2 IMPLANT
PACK UNIVERSAL I (CUSTOM PROCEDURE TRAY) ×2 IMPLANT
PAD ARMBOARD 7.5X6 YLW CONV (MISCELLANEOUS) ×4 IMPLANT
PAD CAST 4YDX4 CTTN HI CHSV (CAST SUPPLIES) IMPLANT
PADDING CAST COTTON 4X4 STRL (CAST SUPPLIES)
PADDING CAST COTTON 6X4 STRL (CAST SUPPLIES) IMPLANT
PLATE PERIPROS RT 12H DIST FEM (Plate) ×2 IMPLANT
SCREW 5.0 70MM (Screw) ×2 IMPLANT
SCREW 5.0 80MM (Screw) ×6 IMPLANT
SCREW CANN COMP CBS TI 7.5X50 (Screw) ×2 IMPLANT
SCREW CANN COMP CBS TI 7.5X55 (Screw) ×2 IMPLANT
SCREW CANN COMP CBS TI 7.5X75 (Screw) ×2 IMPLANT
SCREW CANN COMP CBS TI 7.5X80 (Screw) ×2 IMPLANT
SCREW CORT NCB SELFTAP 5.0X42 (Screw) ×2 IMPLANT
SCREW NCB 5.0X36MM (Screw) ×6 IMPLANT
SCREW NCB 5.0X85MM (Screw) ×2 IMPLANT
SEALER BIPOLAR AQUA 6.0 (INSTRUMENTS) ×2 IMPLANT
SPONGE LAP 18X18 X RAY DECT (DISPOSABLE) ×2 IMPLANT
STAPLER VISISTAT 35W (STAPLE) ×4 IMPLANT
STEM TRI LOC BPS GRIP SZ9 OFFS ×1 IMPLANT
SUCTION FRAZIER HANDLE 10FR (MISCELLANEOUS) ×1
SUCTION TUBE FRAZIER 10FR DISP (MISCELLANEOUS) ×1 IMPLANT
SUT PROLENE 0 CT 2 (SUTURE) IMPLANT
SUT VIC AB 0 CT1 27 (SUTURE)
SUT VIC AB 0 CT1 27XBRD ANBCTR (SUTURE) IMPLANT
SUT VIC AB 1 CT1 27 (SUTURE) ×4
SUT VIC AB 1 CT1 27XBRD ANBCTR (SUTURE) ×2 IMPLANT
SUT VIC AB 2-0 CT1 27 (SUTURE) ×6
SUT VIC AB 2-0 CT1 TAPERPNT 27 (SUTURE) ×3 IMPLANT
SUT VLOC 180 0 24IN GS25 (SUTURE) ×2 IMPLANT
SYR 20ML ECCENTRIC (SYRINGE) IMPLANT
TOWEL OR 17X24 6PK STRL BLUE (TOWEL DISPOSABLE) ×2 IMPLANT
TOWEL OR 17X26 10 PK STRL BLUE (TOWEL DISPOSABLE) ×4 IMPLANT
TRAY FOLEY CATH 16FRSI W/METER (SET/KITS/TRAYS/PACK) IMPLANT
TRI LOC BPS W/GRIP SZ 9 OFFS ×2 IMPLANT
WATER STERILE IRR 1000ML POUR (IV SOLUTION) ×4 IMPLANT

## 2016-11-22 NOTE — Progress Notes (Signed)
Consent obtained from [facility] Doffing program [place].

## 2016-11-22 NOTE — Brief Op Note (Signed)
11/22/2016  1:47 PM  PATIENT:  Zachary [name]  71 y.o. male  PRE-OPERATIVE DIAGNOSIS:  Distal Femoral Fracture Right  POST-OPERATIVE DIAGNOSIS:  Distal femoral fracture right  PROCEDURE:  Procedure(s): OPEN REDUCTION INTERNAL FIXATION (ORIF) DISTAL FEMUR FRACTURE (Right)  SURGEON:  Surgeon(s) and Role:    * Rod Can, MD - Primary  PHYSICIAN ASSISTANT: jackie bissel, pa-c  ASSISTANTS: staff   ANESTHESIA:   general  EBL:  Total I/O In: 1150 [I.V.:900; IV Piggyback:250] Out: 600 [Urine:100; Blood:500]  BLOOD ADMINISTERED:none  DRAINS: none   LOCAL MEDICATIONS USED:  NONE  SPECIMEN:  No Specimen  DISPOSITION OF SPECIMEN:  N/A  COUNTS:  YES  TOURNIQUET:  * No tourniquets in log *  DICTATION: .Other Dictation: Dictation Number [id number]  PLAN OF CARE: Admit to inpatient   PATIENT DISPOSITION:  PACU - hemodynamically stable.   Delay start of Pharmacological VTE agent (>24hrs) due to surgical blood loss or risk of bleeding: not applicable

## 2016-11-22 NOTE — Progress Notes (Signed)
ANTICOAGULATION CONSULT NOTE - Initial Consult  Pharmacy Consult for warfarin Indication: VTE prophylaxis  No Known Allergies  Patient Measurements: Weight: 188 lb 0.8 oz (85.3 kg)  Vital Signs: Temp: 97.5 F (36.4 C) (01/27 1459) Temp Source: Oral (01/27 1459) BP: 116/53 (01/27 1459) Pulse Rate: 85 (01/27 1459)  Labs:  Recent Labs  11/20/16 1446 11/20/16 2303 11/21/16 0556 11/21/16 1325 11/22/16 0812  HGB 9.0* 8.5* 7.5*  --  9.7*  HCT 28.6* 26.2* 23.0*  --  29.9*  PLT 176 143* 129*  --  125*  LABPROT 13.2  --   --   --   --   INR 1.00  --   --   --   --   CREATININE 5.39* 5.38* 5.50* 5.75* 4.58*    Estimated Creatinine Clearance: 16.1 mL/min (by C-G formula based on SCr of 4.58 mg/dL (H)).  Assessment: 71 yo m presenting with femur fx  PMH: ESRD HD, DM2, HN, hypothyroid  Anticoag: warfarin s/p femur fx - target INR 1.7  Renal: HD  Heme/Onc: H&H 9.7/29.9, Plt 125  Goal of Therapy:  INR 1.7 Monitor platelets by anticoagulation protocol: Yes   Plan:  Warfarin 5 mg x 1 Daily INR, CBC q72h Monitor for s/sx of bleeding  Levester Fresh, PharmD, BCPS, BCCCP Clinical Pharmacist 11/22/2016 3:31 PM

## 2016-11-22 NOTE — Anesthesia Preprocedure Evaluation (Signed)
Anesthesia Evaluation  Patient identified by MRN, date of birth, ID band Patient awake    Reviewed: Allergy & Precautions, NPO status , Patient's Chart, lab work & pertinent test results  Airway Mallampati: II  TM Distance: >3 FB     Dental   Pulmonary neg pulmonary ROS,    breath sounds clear to auscultation       Cardiovascular hypertension,  Rhythm:Regular Rate:Normal     Neuro/Psych negative neurological ROS     GI/Hepatic negative GI ROS, Neg liver ROS,   Endo/Other  diabetesHypothyroidism   Renal/GU Renal disease     Musculoskeletal   Abdominal   Peds  Hematology  (+) anemia ,   Anesthesia Other Findings   Reproductive/Obstetrics                             Anesthesia Physical Anesthesia Plan  ASA: III  Anesthesia Plan: General   Post-op Pain Management:    Induction: Intravenous  Airway Management Planned: Oral ETT  Additional Equipment:   Intra-op Plan:   Post-operative Plan: Extubation in OR  Informed Consent: I have reviewed the patients History and Physical, chart, labs and discussed the procedure including the risks, benefits and alternatives for the proposed anesthesia with the patient or authorized representative who has indicated his/her understanding and acceptance.   Dental advisory given  Plan Discussed with: CRNA and Anesthesiologist  Anesthesia Plan Comments:         Anesthesia Quick Evaluation

## 2016-11-22 NOTE — Progress Notes (Signed)
[facility] Rounding Note Subjective/Interval History:  Had ORIF of distal femoral fx  Objective Vital signs in last 24 hours: Vitals:   11/21/16 1640 11/21/16 2040 11/22/16 0543 11/22/16 0911  BP: 120/72 (!) 101/48 (!) 109/58 (!) 106/54  Pulse: (!) 102 (!) 110 100 (!) 105  Resp: 18   19  Temp: 99.8 F (37.7 C) 99.2 F (37.3 C) 99.8 F (37.7 C) 100.3 F (37.9 C)  TempSrc: Oral Oral Oral Oral  SpO2: 97% 96% 96% 96%  Weight: 85.3 kg (188 lb 0.8 oz)      Weight change:   Intake/Output Summary (Last 24 hours) at 11/22/16 1132 Last data filed at 11/22/16 0544  Gross per 24 hour  Intake           218.33 ml  Output             2900 ml  Net         -2681.67 ml   Physical Exam:  Blood pressure (!) 106/54, pulse (!) 105, temperature 100.3 F (37.9 C), temperature source Oral, resp. rate 19, weight 85.3 kg (188 lb 0.8 oz), SpO2 96 %.  Very nice AAM NAD VS as noted Lungs clear S1S2 No S3 No murmur Abd soft and NT RLE -> dsgs in place RUE AVF good bruit and thrill  Labs:  Recent Labs Lab 11/20/16 1446 11/20/16 2303 11/21/16 0556 11/21/16 1325 11/22/16 0812  NA 140  --  139 139 139  K 5.5*  --  5.4* 5.6* 4.4  CL 104  --  105 106 100*  CO2 28  --  24 24 28   GLUCOSE 118*  --  91 96 110*  BUN 46*  --  48* 49* 28*  CREATININE 5.39* 5.38* 5.50* 5.75* 4.58*  CALCIUM 9.0  --  8.7* 8.5* 8.9  PHOS  --   --   --  5.0* 4.9*     Recent Labs Lab 11/20/16 1446 11/21/16 1325 11/22/16 0812  AST 27  --   --   ALT 33  --   --   ALKPHOS 77  --   --   BILITOT 0.4  --   --   PROT 7.1  --   --   ALBUMIN 3.7 2.8* 2.9*    Recent Labs Lab 11/20/16 1446 11/20/16 2303 11/21/16 0556 11/22/16 0812  WBC 8.8 8.0 6.3 9.2  NEUTROABS 6.4  --   --   --   HGB 9.0* 8.5* 7.5* 9.7*  HCT 28.6* 26.2* 23.0* 29.9*  MCV 110.9* 106.1* 106.0* 101.4*  PLT 176 143* 129* 125*    Recent Labs Lab 11/21/16 0620 11/21/16 1141 11/21/16 1757 11/21/16 2044 11/22/16 0646  GLUCAP 105* 95 92 99 102*     Studies/Results: Dg Chest 1 View  Result Date: 11/20/2016 CLINICAL DATA:  Femur fracture. Shortness of breath. Preoperative evaluation. EXAM: CHEST 1 VIEW COMPARISON:  03/06/2016 FINDINGS: Heart size is normal. Mediastinal shadows are normal. Right lung is clear. There is mild atelectasis at the left base. No effusion. No acute bone finding. IMPRESSION: Mild atelectasis at the left base. Electronically Signed   By: Nelson Chimes M.D.   On: 11/20/2016 15:25   Dg Knee 2 Views Right  Result Date: 11/20/2016 CLINICAL DATA:  Pain and swelling RIGHT knee EXAM: RIGHT KNEE - 1-2 VIEW COMPARISON:  None FINDINGS: Osseous demineralization. Joint spaces preserved. Comminuted fracture of the distal RIGHT femoral metaphysis with apex anterior angulation and mild posterior displacement. Intra-articular extension into the intercondylar  notch. Associated soft tissue swelling and deformity. No additional fracture, dislocation, or bone destruction. IMPRESSION: Comminuted displaced and angulated fracture of the distal RIGHT femoral metaphysis with intra-articular extension into the RIGHT knee. Osseous demineralization. Electronically Signed   By: Lavonia Dana M.D.   On: 11/20/2016 13:41   Ct Knee Right Wo Contrast  Result Date: 11/20/2016 CLINICAL DATA:  Femoral fracture after fall at dialysis. EXAM: CT OF THE RIGHT KNEE WITHOUT CONTRAST TECHNIQUE: Multidetector CT imaging of the RIGHT knee was performed according to the standard protocol. Multiplanar CT image reconstructions were also generated. COMPARISON:  Radiographs from 11/20/2016 of the right knee FINDINGS: Bones/Joint/Cartilage Acute, comminuted and closed T-shaped supracondylar fracture of the right femur with sagittal fracture component extending through the intercondylar notch into the femorotibial compartment along the medial aspect of the lateral femoral condyle. The fracture involving the lateral femoral condyle causes the articular surface to be disrupted  by 4 mm along the anterior weight-bearing portion of the condyles by 4 mm on the sagittal reformats (image 55,series 6) will you. Fracture also extends into the lateral patellofemoral compartment. Lipohemarthrosis is noted. There is 1/4 shaft with displacement of the distal fracture fragment. A nondisplaced sagittally oriented posterior cortical fracture extends more proximally of the diaphysis of the femoral shaft, incompletely included that extends at least to the junction of the middle and distal third when correlated with same day radiographs. No tibial fracture. Ligaments Suboptimally assessed by CT. Muscles and Tendons Diffuse periarticular intramuscular edema without intramuscular hematoma. Soft tissues Lipohemarthrosis of the knee joint. Periarticular soft tissue edema. IMPRESSION: Acute, closed, comminuted and dorsally displaced T-shaped supracondylar fracture of the right femur extending into the femorotibial and patellofemoral compartments as described with associated lipohemarthrosis. No tibial fracture. There is a nondisplaced posterior diaphyseal fracture of the femoral shaft that extends more proximally at least to the junction of the middle and distal third. The proximal extent is not entirely included on this study of the knee. Electronically Signed   By: Ashley Royalty M.D.   On: 11/20/2016 19:27   Medications:  . atorvastatin  40 mg Oral QPM  . carvedilol  6.25 mg Oral BID WC  . [START ON 11/23/2016]  ceFAZolin (ANCEF) IV  2 g Intravenous Q12H  . darbepoetin (ARANESP) injection - DIALYSIS  150 mcg Intravenous Q Sat-HD  . doxercalciferol  1 mcg Intravenous Q T,Th,Sa-HD  . ferrous sulfate  325 mg Oral Q breakfast  . insulin aspart  0-5 Units Subcutaneous QHS  . insulin aspart  0-9 Units Subcutaneous TID WC  . isosorbide-hydrALAZINE  1 tablet Oral TID  . levothyroxine  75 mcg Oral QAC breakfast  . niacin  1,000 mg Oral QHS  . pantoprazole  40 mg Oral Daily  . simvastatin  20 mg Oral QHS   . sodium polystyrene  30 g Oral Q M,W,F  . tamsulosin  0.4 mg Oral Daily  . warfarin  5 mg Oral ONCE-1800  . 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 Transferred here for surgery, we are seeing to provide his dialysis. Resides in SNF, has legal guardian  Assessment/Recommendations  1. ESRD - Usual TTS. Had HD yesterday off schedule. Pre-op labs today looked fine. Will get back on usual schedule prior to discharge 2. Comminuted R femur fracture - s/p ORIF 3. Anemia - ABLA + ESRD. Converted hus usual EPO to Aranesp (at higher dose equivalent). Transfused 2 units on HD 1/26 with Hb up to 9.7 this AM. Continue Aranesp. Check iron studies. 4. DM - per primary 5. Hypothyroidism - on replacement 6. HLD - statin 7. Mental retardation - SNF and has legal guardian  Zachary Maes, MD [facility] [phone] pager 11/22/2016, 11:32 AM

## 2016-11-22 NOTE — Consult Note (Signed)
ORTHOPAEDIC CONSULTATION  REQUESTING PHYSICIAN: Jonetta Osgood, MD  PCP:  [name], MD  Chief Complaint: Intra-articular right distal femur fracture  HPI: Zachary [name] is a 71 y.o. African-American male who is mentally handicapped. He has a history of chronic renal failure on dialysis Tuesday, Thursday, and Saturday. He lives in a long-term nursing facility. He has a state a pointed legal guardian. The patient fell after receiving dialysis last Thursday. He was brought to [facility], where x-rays revealed a comminuted right distal femur fracture. He was transferred to [facility]. Dr. Marcelino Scot was attempting to repair the fracture yesterday, but the patient was found to have a hemoglobin of 7.5, and he received dialysis yesterday as well. He has subsequently received 2 units of packed red blood cells. I was asked to assume care by Dr. Marcelino Scot.  Past Medical History:  Diagnosis Date  . Abnormal CT scan, kidney 10/06/2011  . Acute pyelonephritis 10/07/2011  . Anemia    normocytic  . Anxiety    mental retardation  . Bladder wall thickening 10/06/2011  . BPH (benign prostatic hypertrophy)   . Diabetes mellitus   . Edema     history of lower extremity edema  . Heme positive stool   . Hydronephrosis   . Hyperkalemia   . Hyperlipidemia   . Hypernatremia   . Hypertension   . Hypothyroidism   . Impaired speech   . 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 MR (mental retardation)   . Obstructive uropathy   . 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 Poor historian poor historian  . Protein calorie malnutrition ([place])   . Pyelonephritis   . Renal failure (ARF), acute on chronic (HCC)   . Renal insufficiency    chronic history  . Smoking   . Uremia   . Urinary retention   . UTI (lower urinary tract infection) 10/06/2011  . UTI (lower urinary tract infection)    Past Surgical History:  Procedure Laterality Date  . AV FISTULA PLACEMENT Left 07/06/2015   Procedure:  INSERTION LEFT ARM ARTERIOVENOUS GORTEX GRAFT;  Surgeon: Angelia Mould, MD;  Location: [place];  Service: Vascular;  Laterality: Left;  . AV FISTULA PLACEMENT Right 02/26/2016   Procedure: ARTERIOVENOUS (AV) FISTULA CREATION ;  Surgeon: Angelia Mould, MD;  Location: [place];  Service: Vascular;  Laterality: Right;  . [place] REMOVAL Left 10/09/2015   Procedure: REMOVAL OF ARTERIOVENOUS GORETEX GRAFT ([place]) Evacuation of Lymphocele, Vein Patch angioplasty of brachial artery.;  Surgeon: Angelia Mould, MD;  Location: [place];  Service: Vascular;  Laterality: Left;  . BASCILIC VEIN TRANSPOSITION Right 02/26/2016   Procedure: Right BASCILIC VEIN TRANSPOSITION;  Surgeon: Angelia Mould, MD;  Location: [place];  Service: Vascular;  Laterality: Right;  . CIRCUMCISION N/A 01/04/2014   Procedure: CIRCUMCISION ADULT (procedure #1);  Surgeon: Marissa Nestle, MD;  Location: [facility];  Service: Urology;  Laterality: N/A;  . CYSTOSCOPY W/ RETROGRADES Bilateral 06/29/2015   Procedure: CYSTOSCOPY, DILATION OF URETHRAL STRICTURE WITH BILATERAL RETROGRADE PYELOGRAM,SUPRAPUBIC TUBE CHANGE;  Surgeon: Festus Aloe, MD;  Location: [facility];  Service: Urology;  Laterality: Bilateral;  . CYSTOSCOPY WITH URETHRAL DILATATION N/A 12/29/2013   Procedure: CYSTOSCOPY WITH URETHRAL DILATATION;  Surgeon: Marissa Nestle, MD;  Location: [facility];  Service: Urology;  Laterality: N/A;  . PERIPHERAL VASCULAR CATHETERIZATION N/A 10/08/2015   Procedure: A/V Shuntogram;  Surgeon: Angelia Mould, MD;  Location: [place] [facility];  Service: Cardiovascular;  Laterality:  N/A;  . TRANSURETHRAL RESECTION OF PROSTATE N/A 01/04/2014   Procedure: TRANSURETHRAL RESECTION OF THE PROSTATE (TURP) (procedure #2);  Surgeon: Marissa Nestle, MD;  Location: [facility];  Service: Urology;  Laterality: N/A;   Social History   Social History  . Marital status: Single    Spouse name: N/A  . Number of children: N/A  .  Years of education: N/A   Social History Main Topics  . Smoking status: Never Smoker  . Smokeless tobacco: Never Used  . Alcohol use No     Comment: occ. use   . Drug use: No  . Sexual activity: No   Other Topics Concern  . None   Social History Narrative   Lives at assisted living facility   Family History  Problem Relation Age of Onset  . Cancer Mother    No Known Allergies Prior to Admission medications   Medication Sig Start Date End Date Taking? Authorizing Provider  atorvastatin (LIPITOR) 40 MG tablet Take 40 mg by mouth every evening.   Yes Historical Provider, MD  carvedilol (COREG) 6.25 MG tablet Take 1 tablet (6.25 mg total) by mouth 2 (two) times daily with a meal. 03/08/15  Yes [name], MD  epoetin alfa (EPOGEN,PROCRIT) 4000 UNIT/ML injection Inject 4,000 Units into the vein every 14 (fourteen) days.   Yes Historical Provider, MD  ferrous sulfate 325 (65 FE) MG tablet Take 325 mg by mouth 2 (two) times daily with a meal.    Yes Historical Provider, MD  isosorbide-hydrALAZINE (BIDIL) 20-37.5 MG per tablet Take 1 tablet by mouth 3 (three) times daily. 03/08/15  Yes [name], MD  levothyroxine (SYNTHROID, LEVOTHROID) 75 MCG tablet Take 75 mcg by mouth daily before breakfast.    Yes Historical Provider, MD  linagliptin (TRADJENTA) 5 MG TABS tablet Take 5 mg by mouth daily.   Yes Historical Provider, MD  Multiple Vitamin (MULTIVITAMIN WITH MINERALS) TABS tablet Take 1 tablet by mouth daily.   Yes Historical Provider, MD  Neo-Bacit-Poly-Lidocaine (FIRST AID PLUS LIDOCAINE EX) Apply 1 application topically 3 (three) times a week. Prior to dialysis   Yes Historical Provider, MD  omeprazole (PRILOSEC) 20 MG capsule Take 40 mg by mouth daily.   Yes Historical Provider, MD  prednisoLONE acetate (PRED FORTE) 1 % ophthalmic suspension Place 1 drop into the left eye 4 (four) times daily. 2 week course to be completed 11/20/2016   Yes Historical Provider, MD  tamsulosin  (FLOMAX) 0.4 MG CAPS capsule Take 0.4 mg by mouth daily.   Yes Historical Provider, MD  torsemide (DEMADEX) 10 MG tablet Take 5 tablets (50 mg total) by mouth daily. 04/07/16  Yes [name], MD  Vitamin D, Ergocalciferol, (DRISDOL) 50000 UNITS CAPS capsule Take 1 capsule (50,000 Units total) by mouth every 7 (seven) days. Patient taking differently: Take 50,000 Units by mouth every Monday.  03/08/15  Yes [name], MD   Dg Chest 1 View  Result Date: 11/20/2016 CLINICAL DATA:  Femur fracture. Shortness of breath. Preoperative evaluation. EXAM: CHEST 1 VIEW COMPARISON:  03/06/2016 FINDINGS: Heart size is normal. Mediastinal shadows are normal. Right lung is clear. There is mild atelectasis at the left base. No effusion. No acute bone finding. IMPRESSION: Mild atelectasis at the left base. Electronically Signed   By: Nelson Chimes M.D.   On: 11/20/2016 15:25   Dg Knee 2 Views Right  Result Date: 11/20/2016 CLINICAL DATA:  Pain and swelling RIGHT knee EXAM: RIGHT KNEE - 1-2 VIEW COMPARISON:  None FINDINGS: Osseous demineralization. Joint spaces preserved. Comminuted fracture of the distal RIGHT femoral metaphysis with apex anterior angulation and mild posterior displacement. Intra-articular extension into the intercondylar notch. Associated soft tissue swelling and deformity. No additional fracture, dislocation, or bone destruction. IMPRESSION: Comminuted displaced and angulated fracture of the distal RIGHT femoral metaphysis with intra-articular extension into the RIGHT knee. Osseous demineralization. Electronically Signed   By: Lavonia Dana M.D.   On: 11/20/2016 13:41   Ct Knee Right Wo Contrast  Result Date: 11/20/2016 CLINICAL DATA:  Femoral fracture after fall at dialysis. EXAM: CT OF THE RIGHT KNEE WITHOUT CONTRAST TECHNIQUE: Multidetector CT imaging of the RIGHT knee was performed according to the standard protocol. Multiplanar CT image reconstructions were also generated. COMPARISON:   Radiographs from 11/20/2016 of the right knee FINDINGS: Bones/Joint/Cartilage Acute, comminuted and closed T-shaped supracondylar fracture of the right femur with sagittal fracture component extending through the intercondylar notch into the femorotibial compartment along the medial aspect of the lateral femoral condyle. The fracture involving the lateral femoral condyle causes the articular surface to be disrupted by 4 mm along the anterior weight-bearing portion of the condyles by 4 mm on the sagittal reformats (image 55,series 6) will you. Fracture also extends into the lateral patellofemoral compartment. Lipohemarthrosis is noted. There is 1/4 shaft with displacement of the distal fracture fragment. A nondisplaced sagittally oriented posterior cortical fracture extends more proximally of the diaphysis of the femoral shaft, incompletely included that extends at least to the junction of the middle and distal third when correlated with same day radiographs. No tibial fracture. Ligaments Suboptimally assessed by CT. Muscles and Tendons Diffuse periarticular intramuscular edema without intramuscular hematoma. Soft tissues Lipohemarthrosis of the knee joint. Periarticular soft tissue edema. IMPRESSION: Acute, closed, comminuted and dorsally displaced T-shaped supracondylar fracture of the right femur extending into the femorotibial and patellofemoral compartments as described with associated lipohemarthrosis. No tibial fracture. There is a nondisplaced posterior diaphyseal fracture of the femoral shaft that extends more proximally at least to the junction of the middle and distal third. The proximal extent is not entirely included on this study of the knee. Electronically Signed   By: Ashley Royalty M.D.   On: 11/20/2016 19:27    Positive ROS: All other systems have been reviewed and were otherwise negative with the exception of those mentioned in the HPI and as above.  Physical Exam: General: Alert, no acute  distress Cardiovascular: No pedal edema Respiratory: No cyanosis, no use of accessory musculature GI: No organomegaly, abdomen is soft and non-tender Skin: No lesions in the area of chief complaint Neurologic: Sensation intact distally Lymphatic: No axillary or cervical lymphadenopathy  MUSCULOSKELETAL: Examination of the right lower extremity reveals that he has a splint in place. He is able to wiggle his toes. He has intact sensation to light touch. He has palpable pulses.  Assessment: Comminuted intra-articular right distal femur fracture  Plan: The findings were discussed with the patient. We obtained consent from the Department of Social Services. He needs open reduction internal fixation of his right distal femur fracture. He is at increased risk for perioperative complications due to his mental status and dialysis. We will plan for surgery today. Postoperatively, he will be readmitted to the hospitalist service. He'll be nonweightbearing to the right lower extremity for about 10-12 weeks.    Harini Dearmond, Horald Pollen, MD Cell [phone]    11/22/2016 11:29 AM

## 2016-11-22 NOTE — Progress Notes (Signed)
Patient has foley catheter from group home/nursing home facility.  The patient is a dialysis patient.  The catheter has green output with foul odor.  Made physician aware via text page earlier in shift.  Nursing will continue to monitor.

## 2016-11-22 NOTE — Progress Notes (Signed)
PROGRESS NOTE        PATIENT DETAILS Name: Zachary [name] Age: 71 y.o. Sex: male Date of Birth: 07-21-1946 Admit Date: 11/20/2016 Admitting Physician Thurnell Lose, MD LOV:FIEPP,IRJJOAC, MD  Brief Narrative: Patient is a 71 y.o. male with history of ESRD on HD TTS, mild mental retardation, type 2 diabetes, hypertension, hypothyroidism who presented to the hospital on 1/25 following a mechanical fall. He was subsequently found to have a right distal femur fracture. He was subsequently admitted for further evaluation and treatment.  Subjective: Lying comfortably in bed-denies any chest pain or shortness of breath.  Assessment/Plan: Communicative right intra-articular distal femur fracture: Orthopedic service following, and planning surgical intervention  today. As outlined in the H&P, patient remains at moderate risk for the proposed surgical procedure.  Mild hyperkalemia:Underwent HD 1/26-await repeat labs this am.    Acute blood loss anemia on top of anemia related to ESRD: Suspect blood loss-due to femur fracture- transfused 2 units with HD on 1/26-await repeat labs today. Follow CBC  ESRD: HD TTS-nephrology consulted  Hypertension: Controlled, continue Coreg and BiDil  Type 2 diabetes: Hold oral hypoglycemic agents-CBG is relatively stable with SSI  Dyslipidemia: Continue statin and niacin  Hypothyroidism: Continue Synthroid  BPH: Continue Flomax  GERD: PPI  DVT Prophylaxis: Prophylactic Heparin  Code Status: Full code   Family Communication: None at bedside  Disposition Plan: Remain inpatient-but will plan SNF on discharge-likely early next week  Antimicrobial agents: Anti-infectives    Start     Dose/Rate Route Frequency Ordered Stop   11/22/16 1100  ceFAZolin (ANCEF) IVPB 1 g/50 mL premix     1 g 100 mL/hr over 30 Minutes Intravenous  Once 11/21/16 1910     11/21/16 1600  ceFAZolin (ANCEF) IVPB 1 g/50 mL premix  Status:   Discontinued     1 g 100 mL/hr over 30 Minutes Intravenous  Once 11/21/16 1052 11/21/16 1910   11/21/16 0800  ceFAZolin (ANCEF) IVPB 1 g/50 mL premix  Status:  Discontinued     1 g 100 mL/hr over 30 Minutes Intravenous  Once 11/20/16 1705 11/21/16 1052      Procedures: None  CONSULTS:  nephrology and orthopedic surgery  Time spent: 25- minutes-Greater than 50% of this time was spent in counseling, explanation of diagnosis, planning of further management, and coordination of care.  MEDICATIONS: Scheduled Meds: . sodium chloride   Intravenous Once  . carvedilol  6.25 mg Oral BID WC  .  ceFAZolin (ANCEF) IV  1 g Intravenous Once  . darbepoetin (ARANESP) injection - DIALYSIS  150 mcg Intravenous Q Sat-HD  . doxercalciferol  1 mcg Intravenous Q T,Th,Sa-HD  . ferrous sulfate  325 mg Oral Q breakfast  . heparin  5,000 Units Subcutaneous Q8H  . insulin aspart  0-5 Units Subcutaneous QHS  . insulin aspart  0-9 Units Subcutaneous TID WC  . isosorbide-hydrALAZINE  1 tablet Oral TID  . levothyroxine  75 mcg Oral QAC breakfast  . niacin  1,000 mg Oral QHS  . pantoprazole  40 mg Oral Daily  . simvastatin  20 mg Oral QHS  . sodium polystyrene  30 g Oral Q M,W,F  . tamsulosin  0.4 mg Oral Daily   Continuous Infusions: PRN Meds:.HYDROcodone-acetaminophen, metoprolol, morphine injection, ondansetron **OR** ondansetron (ZOFRAN) IV   PHYSICAL EXAM: Vital signs: Vitals:   11/21/16 1630  11/21/16 1640 11/21/16 2040 11/22/16 0543  BP: (!) 107/59 120/72 (!) 101/48 (!) 109/58  Pulse: (!) 106 (!) 102 (!) 110 100  Resp:  18    Temp:  99.8 F (37.7 C) 99.2 F (37.3 C) 99.8 F (37.7 C)  TempSrc:  Oral Oral Oral  SpO2:  97% 96% 96%  Weight:  85.3 kg (188 lb 0.8 oz)     Filed Weights   11/21/16 1210 11/21/16 1640  Weight: 87 kg (191 lb 12.8 oz) 85.3 kg (188 lb 0.8 oz)   Body mass index is 29.45 kg/m.   General appearance :Awake, alert, not in any distress. Speech Clear. Eyes:, pupils  equally reactive to light and accomodation,no scleral icterus. HEENT: Atraumatic and Normocephalic Neck: supple, no JVD. No cervical lymphadenopathy.  Resp:Good air entry bilaterally, no added sounds  CVS: S1 S2 regular, no murmurs.  GI: Bowel sounds present, Non tender and not distended with no gaurding, rigidity or rebound.No organomegaly Extremities: B/L Lower Ext shows no edema, both legs are warm to touch Neurology:  speech clear,Non focal, sensation is grossly intact. Musculoskeletal:No digital cyanosis Skin:No Rash, warm and dry Wounds:N/A  I have personally reviewed following labs and imaging studies  LABORATORY DATA: CBC:  Recent Labs Lab 11/20/16 1446 11/20/16 2303 11/21/16 0556  WBC 8.8 8.0 6.3  NEUTROABS 6.4  --   --   HGB 9.0* 8.5* 7.5*  HCT 28.6* 26.2* 23.0*  MCV 110.9* 106.1* 106.0*  PLT 176 143* 129*    Basic Metabolic Panel:  Recent Labs Lab 11/20/16 1446 11/20/16 2303 11/21/16 0556 11/21/16 1325  NA 140  --  139 139  K 5.5*  --  5.4* 5.6*  CL 104  --  105 106  CO2 28  --  24 24  GLUCOSE 118*  --  91 96  BUN 46*  --  48* 49*  CREATININE 5.39* 5.38* 5.50* 5.75*  CALCIUM 9.0  --  8.7* 8.5*  PHOS  --   --   --  5.0*    GFR: Estimated Creatinine Clearance: 12.8 mL/min (by C-G formula based on SCr of 5.75 mg/dL (H)).  Liver Function Tests:  Recent Labs Lab 11/20/16 1446 11/21/16 1325  AST 27  --   ALT 33  --   ALKPHOS 77  --   BILITOT 0.4  --   PROT 7.1  --   ALBUMIN 3.7 2.8*   No results for input(s): LIPASE, AMYLASE in the last 168 hours. No results for input(s): AMMONIA in the last 168 hours.  Coagulation Profile:  Recent Labs Lab 11/20/16 1446  INR 1.00    Cardiac Enzymes: No results for input(s): CKTOTAL, CKMB, CKMBINDEX, TROPONINI in the last 168 hours.  BNP (last 3 results) No results for input(s): PROBNP in the last 8760 hours.  HbA1C:  Recent Labs  11/20/16 2303  HGBA1C 5.0    CBG:  Recent Labs Lab  11/21/16 0620 11/21/16 1141 11/21/16 1757 11/21/16 2044 11/22/16 0646  GLUCAP 105* 95 92 99 102*    Lipid Profile: No results for input(s): CHOL, HDL, LDLCALC, TRIG, CHOLHDL, LDLDIRECT in the last 72 hours.  Thyroid Function Tests: No results for input(s): TSH, T4TOTAL, FREET4, T3FREE, THYROIDAB in the last 72 hours.  Anemia Panel: No results for input(s): VITAMINB12, FOLATE, FERRITIN, TIBC, IRON, RETICCTPCT in the last 72 hours.  Urine analysis:    Component Value Date/Time   COLORURINE YELLOW 10/09/2016 0930   APPEARANCEUR TURBID (A) 10/09/2016 0930   LABSPEC 1.025 10/09/2016  0930   PHURINE 6.0 10/09/2016 0930   GLUCOSEU NEGATIVE 10/09/2016 0930   HGBUR LARGE (A) 10/09/2016 0930   BILIRUBINUR NEGATIVE 10/09/2016 0930   KETONESUR TRACE (A) 10/09/2016 0930   PROTEINUR 100 (A) 10/09/2016 0930   UROBILINOGEN 0.2 07/20/2015 1850   NITRITE NEGATIVE 10/09/2016 0930   LEUKOCYTESUR LARGE (A) 10/09/2016 0930    Sepsis Labs: Lactic Acid, Venous    Component Value Date/Time   LATICACIDVEN 0.51 03/10/2016 0201    MICROBIOLOGY: Recent Results (from the past 240 hour(s))  Surgical PCR screen     Status: None   Collection Time: 11/21/16 12:05 AM  Result Value Ref Range Status   MRSA, PCR NEGATIVE NEGATIVE Final   Staphylococcus aureus NEGATIVE NEGATIVE Final    Comment:        The Xpert SA Assay (FDA approved for NASAL specimens in patients over 47 years of age), is one component of a comprehensive surveillance program.  Test performance has been validated by [facility] for patients greater than or equal to 54 year old. It is not intended to diagnose infection nor to guide or monitor treatment.     RADIOLOGY STUDIES/RESULTS: Dg Chest 1 View  Result Date: 11/20/2016 CLINICAL DATA:  Femur fracture. Shortness of breath. Preoperative evaluation. EXAM: CHEST 1 VIEW COMPARISON:  03/06/2016 FINDINGS: Heart size is normal. Mediastinal shadows are normal. Right lung is  clear. There is mild atelectasis at the left base. No effusion. No acute bone finding. IMPRESSION: Mild atelectasis at the left base. Electronically Signed   By: Nelson Chimes M.D.   On: 11/20/2016 15:25   Dg Knee 2 Views Right  Result Date: 11/20/2016 CLINICAL DATA:  Pain and swelling RIGHT knee EXAM: RIGHT KNEE - 1-2 VIEW COMPARISON:  None FINDINGS: Osseous demineralization. Joint spaces preserved. Comminuted fracture of the distal RIGHT femoral metaphysis with apex anterior angulation and mild posterior displacement. Intra-articular extension into the intercondylar notch. Associated soft tissue swelling and deformity. No additional fracture, dislocation, or bone destruction. IMPRESSION: Comminuted displaced and angulated fracture of the distal RIGHT femoral metaphysis with intra-articular extension into the RIGHT knee. Osseous demineralization. Electronically Signed   By: Lavonia Dana M.D.   On: 11/20/2016 13:41   Ct Knee Right Wo Contrast  Result Date: 11/20/2016 CLINICAL DATA:  Femoral fracture after fall at dialysis. EXAM: CT OF THE RIGHT KNEE WITHOUT CONTRAST TECHNIQUE: Multidetector CT imaging of the RIGHT knee was performed according to the standard protocol. Multiplanar CT image reconstructions were also generated. COMPARISON:  Radiographs from 11/20/2016 of the right knee FINDINGS: Bones/Joint/Cartilage Acute, comminuted and closed T-shaped supracondylar fracture of the right femur with sagittal fracture component extending through the intercondylar notch into the femorotibial compartment along the medial aspect of the lateral femoral condyle. The fracture involving the lateral femoral condyle causes the articular surface to be disrupted by 4 mm along the anterior weight-bearing portion of the condyles by 4 mm on the sagittal reformats (image 55,series 6) will you. Fracture also extends into the lateral patellofemoral compartment. Lipohemarthrosis is noted. There is 1/4 shaft with displacement of  the distal fracture fragment. A nondisplaced sagittally oriented posterior cortical fracture extends more proximally of the diaphysis of the femoral shaft, incompletely included that extends at least to the junction of the middle and distal third when correlated with same day radiographs. No tibial fracture. Ligaments Suboptimally assessed by CT. Muscles and Tendons Diffuse periarticular intramuscular edema without intramuscular hematoma. Soft tissues Lipohemarthrosis of the knee joint. Periarticular soft tissue edema. IMPRESSION:  Acute, closed, comminuted and dorsally displaced T-shaped supracondylar fracture of the right femur extending into the femorotibial and patellofemoral compartments as described with associated lipohemarthrosis. No tibial fracture. There is a nondisplaced posterior diaphyseal fracture of the femoral shaft that extends more proximally at least to the junction of the middle and distal third. The proximal extent is not entirely included on this study of the knee. Electronically Signed   By: Ashley Royalty M.D.   On: 11/20/2016 19:27     LOS: 2 days   Oren Binet, MD  [facility] Pager:[phone]  If 7PM-7AM, please contact night-coverage [email or web address] Password [facility] 11/22/2016, 8:32 AM

## 2016-11-22 NOTE — Transfer of Care (Signed)
Immediate Anesthesia Transfer of Care Note  Patient: Zachary [name]  Procedure(s) Performed: Procedure(s): OPEN REDUCTION INTERNAL FIXATION (ORIF) DISTAL FEMUR FRACTURE (Right)  Patient Location: PACU  Anesthesia Type:General  Level of Consciousness: awake, alert  and patient cooperative  Airway & Oxygen Therapy: Patient Spontanous Breathing  Post-op Assessment: Report given to RN and Post -op Vital signs reviewed and stable  Post vital signs: Reviewed and stable  Last Vitals:  Vitals:   11/22/16 0543 11/22/16 0911  BP: (!) 109/58 (!) 106/54  Pulse: 100 (!) 105  Resp:  19  Temp: 37.7 C 37.9 C    Last Pain:  Vitals:   11/22/16 0911  TempSrc: Oral  PainSc: 2       Patients Stated Pain Goal: 0 (11/46/43 1427)  Complications: No apparent anesthesia complications

## 2016-11-22 NOTE — Anesthesia Procedure Notes (Signed)
Procedure Name: Intubation Date/Time: 11/22/2016 11:29 AM Performed by: Salli Quarry Kaydi Kley Pre-anesthesia Checklist: Patient identified, Emergency Drugs available, Suction available and Patient being monitored Patient Re-evaluated:Patient Re-evaluated prior to inductionOxygen Delivery Method: Circle System Utilized Preoxygenation: Pre-oxygenation with 100% oxygen Intubation Type: IV induction Ventilation: Mask ventilation without difficulty Laryngoscope Size: Mac and 4 Grade View: Grade I Tube type: Oral Tube size: 7.5 mm Number of attempts: 1 Airway Equipment and Method: Stylet and Oral airway Placement Confirmation: ETT inserted through vocal cords under direct vision,  positive ETCO2 and breath sounds checked- equal and bilateral Secured at: 23 cm Tube secured with: Tape Dental Injury: Teeth and Oropharynx as per pre-operative assessment

## 2016-11-22 NOTE — Anesthesia Postprocedure Evaluation (Signed)
Anesthesia Post Note  Patient: Zachary [name]  Procedure(s) Performed: Procedure(s) (LRB): OPEN REDUCTION INTERNAL FIXATION (ORIF) DISTAL FEMUR FRACTURE (Right)  Patient location during evaluation: PACU Anesthesia Type: General Level of consciousness: awake Pain management: pain level controlled Respiratory status: spontaneous breathing Cardiovascular status: stable Anesthetic complications: no       Last Vitals:  Vitals:   11/22/16 1459 11/22/16 1723  BP: (!) 116/53 125/62  Pulse: 85 91  Resp: 18 18  Temp: 36.4 C     Last Pain:  Vitals:   11/22/16 1459  TempSrc: Oral  PainSc:                  Jericha Bryden

## 2016-11-23 DIAGNOSIS — S72491A Other fracture of lower end of right femur, initial encounter for closed fracture: Secondary | ICD-10-CM

## 2016-11-23 LAB — URINALYSIS, ROUTINE W REFLEX MICROSCOPIC
Bilirubin Urine: NEGATIVE
Glucose, UA: NEGATIVE mg/dL
Hgb urine dipstick: NEGATIVE
Ketones, ur: NEGATIVE mg/dL
Leukocytes, UA: NEGATIVE
Nitrite: NEGATIVE
Protein, ur: NEGATIVE mg/dL
Specific Gravity, Urine: 1 — ABNORMAL LOW (ref 1.005–1.030)
pH: 6 (ref 5.0–8.0)

## 2016-11-23 LAB — CBC
HCT: 23.1 % — ABNORMAL LOW (ref 39.0–52.0)
Hemoglobin: 7.7 g/dL — ABNORMAL LOW (ref 13.0–17.0)
MCH: 33.3 pg (ref 26.0–34.0)
MCHC: 33.3 g/dL (ref 30.0–36.0)
MCV: 100 fL (ref 78.0–100.0)
Platelets: 124 10*3/uL — ABNORMAL LOW (ref 150–400)
RBC: 2.31 MIL/uL — ABNORMAL LOW (ref 4.22–5.81)
RDW: 18.6 % — ABNORMAL HIGH (ref 11.5–15.5)
WBC: 11.9 10*3/uL — ABNORMAL HIGH (ref 4.0–10.5)

## 2016-11-23 LAB — RENAL FUNCTION PANEL
Albumin: 2.8 g/dL — ABNORMAL LOW (ref 3.5–5.0)
Anion gap: 10 (ref 5–15)
BUN: 43 mg/dL — ABNORMAL HIGH (ref 6–20)
CO2: 25 mmol/L (ref 22–32)
Calcium: 8.4 mg/dL — ABNORMAL LOW (ref 8.9–10.3)
Chloride: 101 mmol/L (ref 101–111)
Creatinine, Ser: 6.26 mg/dL — ABNORMAL HIGH (ref 0.61–1.24)
GFR calc Af Amer: 10 mL/min — ABNORMAL LOW (ref >60)
GFR calc non Af Amer: 8 mL/min — ABNORMAL LOW (ref >60)
Glucose, Bld: 143 mg/dL — ABNORMAL HIGH (ref 65–99)
Phosphorus: 6.7 mg/dL — ABNORMAL HIGH (ref 2.5–4.6)
Potassium: 5.1 mmol/L (ref 3.5–5.1)
Sodium: 136 mmol/L (ref 135–145)

## 2016-11-23 LAB — PREPARE RBC (CROSSMATCH)

## 2016-11-23 LAB — GLUCOSE, CAPILLARY
Glucose-Capillary: 143 mg/dL — ABNORMAL HIGH (ref 65–99)
Glucose-Capillary: 138 mg/dL — ABNORMAL HIGH (ref 65–99)
Glucose-Capillary: 148 mg/dL — ABNORMAL HIGH (ref 65–99)
Glucose-Capillary: 109 mg/dL — ABNORMAL HIGH (ref 65–99)

## 2016-11-23 LAB — PROTIME-INR
INR: 1.28
Prothrombin Time: 16.1 seconds — ABNORMAL HIGH (ref 11.4–15.2)

## 2016-11-23 LAB — IRON AND TIBC
Iron: <5 ug/dL — ABNORMAL LOW (ref 45–182)
TIBC: 148 ug/dL — ABNORMAL LOW (ref 250–450)

## 2016-11-23 MED ORDER — WARFARIN SODIUM 3 MG PO TABS
3.0000 mg | ORAL_TABLET | Freq: Once | ORAL | Status: AC
  Administered 2016-11-23: 3 mg via ORAL
  Filled 2016-11-23: qty 1

## 2016-11-23 MED ORDER — SODIUM CHLORIDE 0.9 % IV SOLN
125.0000 mg | Freq: Every day | INTRAVENOUS | Status: DC
  Administered 2016-11-23 – 2016-11-26 (×4): 125 mg via INTRAVENOUS
  Filled 2016-11-23 (×7): qty 10

## 2016-11-23 MED ORDER — COUMADIN BOOK
Freq: Once | Status: AC
  Administered 2016-11-23: 1
  Filled 2016-11-23: qty 1

## 2016-11-23 MED ORDER — SODIUM CHLORIDE 0.9 % IV SOLN: Freq: Once | INTRAVENOUS | Status: DC

## 2016-11-23 NOTE — Evaluation (Signed)
Physical Therapy Evaluation Patient Details Name: Zachary [name] MRN: [id number] DOB: 1946-10-19 Today's Date: 11/23/2016   History of Present Illness  Pt is a 71 y/o male s/p ORIF of distal femur fracture as a result of a mechanical fall. Pt has a past medical history including DM, MR, HLD, HTN, and ARF.  Clinical Impression  Pt is POD 1 and limited by pain. Prior to admission, pt was living in a group home, going to HD T,TH,SA and was ambulatory without an assistive device. Pt is MR and requires simple cues to complete tasks. Pt requires Mod To Max A for all mobility this session due to inability to maintain NWB on RLE without assistance. Pt will benefit from continuing to be seen acutely to address the above deficits to assist with a smooth transition home. Pt will require SNF placement to maximize his functional outcomes.     Follow Up Recommendations SNF    Equipment Recommendations  None recommended by PT (defer to next venue of care)    Recommendations for Other Services       Precautions / Restrictions Precautions Precautions: Fall Precaution Comments: fall resulted in current hospitalization Restrictions Weight Bearing Restrictions: Yes RLE Weight Bearing: Non weight bearing      Mobility  Bed Mobility Overal bed mobility: Needs Assistance Bed Mobility: Supine to Sit     Supine to sit: Mod assist;+2 for physical assistance;+2 for safety/equipment;HOB elevated     General bed mobility comments: Assist for BLE to come edge of bed, use of bed pad from therapist to assist with positioing and support for trunk to come upright, verbal cues for use of bed rail to assist  Transfers Overall transfer level: Needs assistance Equipment used: Rolling walker (2 wheeled);2 person hand held assist Transfers: Sit to/from [employer] Sit to Stand: Max assist;+2 physical assistance;+2 safety/equipment;From elevated surface (Use RW)   Squat pivot transfers: Max  assist;+2 physical assistance;+2 safety/equipment;From elevated surface (2 HHA)     General transfer comment: Pt not able to maintain NWB this session. max verbal cues, and physical assist needed.   Ambulation/Gait             General Gait Details: Unable to safely assess  Stairs            Wheelchair Mobility    Modified Rankin (Stroke Patients Only)       Balance Overall balance assessment: Needs assistance Sitting-balance support: Single extremity supported;Feet supported Sitting balance-Leahy Scale: Poor Sitting balance - Comments: sitting EOB with no back support Postural control: Left lateral lean Standing balance support: Bilateral upper extremity supported Standing balance-Leahy Scale: Zero Standing balance comment: depedent on therapists for support                             Pertinent Vitals/Pain Pain Assessment: Faces Faces Pain Scale: Hurts whole lot Pain Location: RLE Pain Descriptors / Indicators: Grimacing;Guarding;Moaning;Sore;Sharp Pain Intervention(s): Limited activity within patient's tolerance;Monitored during session;Repositioned;Premedicated before session    Home Living Family/patient expects to be discharged to:: Skilled nursing facility Living Arrangements: [place] Equipment: None      Prior Function Level of [place]: Needs assistance   Gait / Transfers Assistance Needed: Pt reports no DME necessary  ADL's / Homemaking Assistance Needed: Pt reports no needs  Comments: Pt is poor historian     Hand Dominance  Extremity/Trunk Assessment   Upper Extremity Assessment Upper Extremity Assessment: Defer to OT evaluation    Lower Extremity Assessment Lower Extremity Assessment: RLE deficits/detail RLE Deficits / Details: pt with normal post op pain and weakness. At least 2/5 grossly RLE.  RLE: Unable to fully assess due to pain    Cervical / Trunk Assessment Cervical /  Trunk Assessment: Normal  Communication   Communication: Other (comment) (baseline speech deficits)  Cognition Arousal/Alertness: Awake/alert Behavior During Therapy: WFL for tasks assessed/performed Overall Cognitive Status: History of cognitive impairments - at baseline                      General Comments      Exercises     Assessment/Plan    PT Assessment Patient needs continued PT services  PT Problem List Decreased strength;Decreased range of motion;Decreased activity tolerance;Decreased balance;Decreased mobility;Decreased knowledge of use of DME;Pain          PT Treatment Interventions DME instruction;Gait training;Functional mobility training;Therapeutic activities;Therapeutic exercise;Balance training    PT Goals (Current goals can be found in the Care Plan section)  Acute Rehab PT Goals Patient Stated Goal: get better PT Goal Formulation: With patient Time For Goal Achievement: 11/30/16 Potential to Achieve Goals: Good    Frequency Min 3X/week   Barriers to discharge        Co-evaluation PT/OT/SLP Co-Evaluation/Treatment: Yes Reason for Co-Treatment: Complexity of the patient's impairments (multi-system involvement) PT goals addressed during session: Mobility/safety with mobility OT goals addressed during session: ADL's and self-care       End of Session Equipment Utilized During Treatment: Gait belt Activity Tolerance: Patient limited by pain;Patient limited by fatigue Patient left: in chair;with call bell/phone within reach;with chair alarm set Nurse Communication: Mobility status;Need for lift equipment;Weight bearing status         Time: 0931-1216 PT Time Calculation (min) (ACUTE ONLY): 35 min   Charges:   PT Evaluation $PT Eval Moderate Complexity: 1 Procedure     PT G Codes:        Scheryl Marten PT, DPT  [phone]  11/23/2016, 12:20 PM

## 2016-11-23 NOTE — Progress Notes (Signed)
   Subjective:  Patient reports pain as mild to moderate.  No specific complaints.  Resting well.  Objective:   VITALS:   Vitals:   11/22/16 1459 11/22/16 1723 11/22/16 1942 11/23/16 0407  BP: (!) 116/53 125/62 (!) 108/52 (!) 99/48  Pulse: 85 91 64 (!) 111  Resp: 18 18    Temp: 97.5 F (36.4 C)  99.5 F (37.5 C)   TempSrc: Oral  Oral   SpO2: 96%  97% 91%  Weight:        ABD soft Neurovascular intact Sensation intact distally Intact pulses distally Incision: dressing C/D/I and no drainage Compartment soft   Lab Results  Component Value Date   WBC 11.9 (H) 11/23/2016   HGB 7.7 (L) 11/23/2016   HCT 23.1 (L) 11/23/2016   MCV 100.0 11/23/2016   PLT 124 (L) 11/23/2016   BMET    Component Value Date/Time   NA 136 11/23/2016 0502   K 5.1 11/23/2016 0502   CL 101 11/23/2016 0502   CO2 25 11/23/2016 0502   GLUCOSE 143 (H) 11/23/2016 0502   BUN 43 (H) 11/23/2016 0502   CREATININE 6.26 (H) 11/23/2016 0502   CALCIUM 8.4 (L) 11/23/2016 0502   CALCIUM 8.3 (L) 03/03/2015 0622   GFRNONAA 8 (L) 11/23/2016 0502   GFRAA 10 (L) 11/23/2016 0502     Assessment/Plan: 1 Day Post-Op   Principal Problem:   Right femoral fracture (HCC) Active Problems:   DM (diabetes mellitus), type 2, uncontrolled ([place])   Mental retardation   Benign prostatic hyperplasia with urinary retention   Hyperlipidemia   End stage renal disease (HCC)   Closed comminuted intra-articular fracture of distal femur, right, initial encounter ([place])   Advance diet Up with therapy -NWB RLE -dvt ppx- warfarin -medical management per Medicine team   Nicholes Stairs 11/23/2016, 9:50 AM   Geralynn Rile, MD [phone]

## 2016-11-23 NOTE — Progress Notes (Signed)
PROGRESS NOTE        PATIENT DETAILS Name: Zachary [name] Age: 71 y.o. Sex: male Date of Birth: 1946-02-03 Admit Date: 11/20/2016 Admitting Physician Zachary Lose, MD YOK:HTXHF,SFSELTR, MD  Brief Narrative: Patient is a 71 y.o. male with history of ESRD on HD TTS, mild mental retardation, type 2 diabetes, hypertension, hypothyroidism who presented to the hospital on 1/25 following a mechanical fall. He was subsequently found to have a right distal femur fracture. He was subsequently admitted for further evaluation and treatment.  Subjective: Up in chair-no major issues this morning.   Assessment/Plan: Communicative right intra-articular distal femur fracture: Secondary to a mechanical fall, underwent ORIF on 1/27. Orthopedics following, recommendations are to start Coumadin for a VTE prophylaxis, and to remain nonweightbearing. Will be discharged back to SNF when cleared by orthopedics.   Acute blood loss anemia on top of anemia related to ESRD: Suspect acute perioperative blood loss in a setting of femur fracture-transfused 2 units of PRBC so far, hemoglobin down to 7.7 (per op note approximately 500 mL of blood loss). May need one additional 1 unit of PRBC with hemodialysis -recheck CBC in a.m.-currently appears asymptomatic.  ESRD: HD TTS-nephrology consulted  Hypertension: Controlled, continue Coreg and BiDil  Type 2 diabetes: Hold oral hypoglycemic agents-CBG is relatively stable with SSI  Dyslipidemia: Continue statin and niacin  Hypothyroidism: Continue Synthroid  BPH: Continue Flomax  GERD: PPI  DVT Prophylaxis: Coumadin  Code Status: Full code   Family Communication: None at bedside  Disposition Plan: Remain inpatient-but will plan SNF-in the next 1-2 days  Antimicrobial agents: Anti-infectives    Start     Dose/Rate Route Frequency Ordered Stop   11/23/16 0000  ceFAZolin (ANCEF) IVPB 2g/100 mL premix     2 g 200 mL/hr over 30  Minutes Intravenous Every 12 hours 11/22/16 1409 11/23/16 2359   11/22/16 1100  ceFAZolin (ANCEF) IVPB 1 g/50 mL premix     1 g 100 mL/hr over 30 Minutes Intravenous  Once 11/21/16 1910 11/22/16 1147   11/21/16 1600  ceFAZolin (ANCEF) IVPB 1 g/50 mL premix  Status:  Discontinued     1 g 100 mL/hr over 30 Minutes Intravenous  Once 11/21/16 1052 11/21/16 1910   11/21/16 0800  ceFAZolin (ANCEF) IVPB 1 g/50 mL premix  Status:  Discontinued     1 g 100 mL/hr over 30 Minutes Intravenous  Once 11/20/16 1705 11/21/16 1052      Procedures: None  CONSULTS:  nephrology and orthopedic surgery  Time spent: 25- minutes-Greater than 50% of this time was spent in counseling, explanation of diagnosis, planning of further management, and coordination of care.  MEDICATIONS: Scheduled Meds: . atorvastatin  40 mg Oral QPM  . carvedilol  6.25 mg Oral BID WC  .  ceFAZolin (ANCEF) IV  2 g Intravenous Q12H  . coumadin book   Does not apply Once  . darbepoetin (ARANESP) injection - DIALYSIS  150 mcg Intravenous Q Sat-HD  . doxercalciferol  1 mcg Intravenous Q T,Th,Sa-HD  . ferrous sulfate  325 mg Oral Q breakfast  . insulin aspart  0-5 Units Subcutaneous QHS  . insulin aspart  0-9 Units Subcutaneous TID WC  . isosorbide-hydrALAZINE  1 tablet Oral TID  . levothyroxine  75 mcg Oral QAC breakfast  . niacin  1,000 mg Oral QHS  . pantoprazole  40 mg Oral Daily  . simvastatin  20 mg Oral QHS  . sodium polystyrene  30 g Oral Q M,W,F  . tamsulosin  0.4 mg Oral Daily  . warfarin  3 mg Oral ONCE-1800  . Warfarin - Pharmacist Dosing Inpatient   Does not apply q1800   Continuous Infusions: PRN Meds:.acetaminophen **OR** acetaminophen, HYDROcodone-acetaminophen, menthol-cetylpyridinium **OR** phenol, metoCLOPramide **OR** metoCLOPramide (REGLAN) injection, metoprolol, morphine injection, ondansetron **OR** ondansetron (ZOFRAN) IV   PHYSICAL EXAM: Vital signs: Vitals:   11/22/16 1459 11/22/16 1723 11/22/16  1942 11/23/16 0407  BP: (!) 116/53 125/62 (!) 108/52 (!) 99/48  Pulse: 85 91 64 (!) 111  Resp: 18 18    Temp: 97.5 F (36.4 C)  99.5 F (37.5 C)   TempSrc: Oral  Oral   SpO2: 96%  97% 91%  Weight:       Filed Weights   11/21/16 1210 11/21/16 1640  Weight: 87 kg (191 lb 12.8 oz) 85.3 kg (188 lb 0.8 oz)   Body mass index is 29.45 kg/m.   General appearance :Awake, alert, not in any distress. Speech Clear. Eyes:, pupils equally reactive to light and accomodation,no scleral icterus. HEENT: Atraumatic and Normocephalic Neck: supple, no JVD. No cervical lymphadenopathy.  Resp:Good air entry bilaterally, no added sounds  CVS: S1 S2 regular, no murmurs.  GI: Bowel sounds present, Non tender and not distended with no gaurding, rigidity or rebound.No organomegaly Extremities: B/L Lower Ext shows no edema, both legs are warm to touch Neurology:  speech clear,Non focal, sensation is grossly intact. Musculoskeletal:No digital cyanosis Skin:No Rash, warm and dry Wounds:N/A  I have personally reviewed following labs and imaging studies  LABORATORY DATA: CBC:  Recent Labs Lab 11/20/16 1446 11/20/16 2303 11/21/16 0556 11/22/16 0812 11/23/16 0742  WBC 8.8 8.0 6.3 9.2 11.9*  NEUTROABS 6.4  --   --   --   --   HGB 9.0* 8.5* 7.5* 9.7* 7.7*  HCT 28.6* 26.2* 23.0* 29.9* 23.1*  MCV 110.9* 106.1* 106.0* 101.4* 100.0  PLT 176 143* 129* 125* 124*    Basic Metabolic Panel:  Recent Labs Lab 11/20/16 1446 11/20/16 2303 11/21/16 0556 11/21/16 1325 11/22/16 0812 11/23/16 0502  NA 140  --  139 139 139 136  K 5.5*  --  5.4* 5.6* 4.4 5.1  CL 104  --  105 106 100* 101  CO2 28  --  24 24 28 25   GLUCOSE 118*  --  91 96 110* 143*  BUN 46*  --  48* 49* 28* 43*  CREATININE 5.39* 5.38* 5.50* 5.75* 4.58* 6.26*  CALCIUM 9.0  --  8.7* 8.5* 8.9 8.4*  PHOS  --   --   --  5.0* 4.9* 6.7*    GFR: Estimated Creatinine Clearance: 11.8 mL/min (by C-G formula based on SCr of 6.26 mg/dL  (H)).  Liver Function Tests:  Recent Labs Lab 11/20/16 1446 11/21/16 1325 11/22/16 0812 11/23/16 0502  AST 27  --   --   --   ALT 33  --   --   --   ALKPHOS 77  --   --   --   BILITOT 0.4  --   --   --   PROT 7.1  --   --   --   ALBUMIN 3.7 2.8* 2.9* 2.8*   No results for input(s): LIPASE, AMYLASE in the last 168 hours. No results for input(s): AMMONIA in the last 168 hours.  Coagulation Profile:  Recent Labs Lab 11/20/16 1446  11/23/16 0502  INR 1.00 1.28    Cardiac Enzymes: No results for input(s): CKTOTAL, CKMB, CKMBINDEX, TROPONINI in the last 168 hours.  BNP (last 3 results) No results for input(s): PROBNP in the last 8760 hours.  HbA1C:  Recent Labs  11/20/16 2303  HGBA1C 5.0    CBG:  Recent Labs Lab 11/22/16 0646 11/22/16 1407 11/22/16 1652 11/22/16 2121 11/23/16 0632  GLUCAP 102* 100* 117* 124* 143*    Lipid Profile: No results for input(s): CHOL, HDL, LDLCALC, TRIG, CHOLHDL, LDLDIRECT in the last 72 hours.  Thyroid Function Tests: No results for input(s): TSH, T4TOTAL, FREET4, T3FREE, THYROIDAB in the last 72 hours.  Anemia Panel:  Recent Labs  11/23/16 0502  TIBC 148*  IRON <5*    Urine analysis:    Component Value Date/Time   COLORURINE COLORLESS (A) 11/23/2016 0841   APPEARANCEUR CLEAR 11/23/2016 0841   LABSPEC 1.000 (L) 11/23/2016 0841   PHURINE 6.0 11/23/2016 0841   GLUCOSEU NEGATIVE 11/23/2016 0841   HGBUR NEGATIVE 11/23/2016 0841   BILIRUBINUR NEGATIVE 11/23/2016 0841   KETONESUR NEGATIVE 11/23/2016 0841   PROTEINUR NEGATIVE 11/23/2016 0841   UROBILINOGEN 0.2 07/20/2015 1850   NITRITE NEGATIVE 11/23/2016 0841   LEUKOCYTESUR NEGATIVE 11/23/2016 0841    Sepsis Labs: Lactic Acid, Venous    Component Value Date/Time   LATICACIDVEN 0.51 03/10/2016 0201    MICROBIOLOGY: Recent Results (from the past 240 hour(s))  Surgical PCR screen     Status: None   Collection Time: 11/21/16 12:05 AM  Result Value Ref Range  Status   MRSA, PCR NEGATIVE NEGATIVE Final   Staphylococcus aureus NEGATIVE NEGATIVE Final    Comment:        The Xpert SA Assay (FDA approved for NASAL specimens in patients over 63 years of age), is one component of a comprehensive surveillance program.  Test performance has been validated by [facility] for patients greater than or equal to 86 year old. It is not intended to diagnose infection nor to guide or monitor treatment.     RADIOLOGY STUDIES/RESULTS: Dg Chest 1 View  Result Date: 11/20/2016 CLINICAL DATA:  Femur fracture. Shortness of breath. Preoperative evaluation. EXAM: CHEST 1 VIEW COMPARISON:  03/06/2016 FINDINGS: Heart size is normal. Mediastinal shadows are normal. Right lung is clear. There is mild atelectasis at the left base. No effusion. No acute bone finding. IMPRESSION: Mild atelectasis at the left base. Electronically Signed   By: Nelson Chimes M.D.   On: 11/20/2016 15:25   Dg Knee 2 Views Right  Result Date: 11/20/2016 CLINICAL DATA:  Pain and swelling RIGHT knee EXAM: RIGHT KNEE - 1-2 VIEW COMPARISON:  None FINDINGS: Osseous demineralization. Joint spaces preserved. Comminuted fracture of the distal RIGHT femoral metaphysis with apex anterior angulation and mild posterior displacement. Intra-articular extension into the intercondylar notch. Associated soft tissue swelling and deformity. No additional fracture, dislocation, or bone destruction. IMPRESSION: Comminuted displaced and angulated fracture of the distal RIGHT femoral metaphysis with intra-articular extension into the RIGHT knee. Osseous demineralization. Electronically Signed   By: Lavonia Dana M.D.   On: 11/20/2016 13:41   Ct Knee Right Wo Contrast  Result Date: 11/20/2016 CLINICAL DATA:  Femoral fracture after fall at dialysis. EXAM: CT OF THE RIGHT KNEE WITHOUT CONTRAST TECHNIQUE: Multidetector CT imaging of the RIGHT knee was performed according to the standard protocol. Multiplanar CT image  reconstructions were also generated. COMPARISON:  Radiographs from 11/20/2016 of the right knee FINDINGS: Bones/Joint/Cartilage Acute, comminuted and closed T-shaped supracondylar  fracture of the right femur with sagittal fracture component extending through the intercondylar notch into the femorotibial compartment along the medial aspect of the lateral femoral condyle. The fracture involving the lateral femoral condyle causes the articular surface to be disrupted by 4 mm along the anterior weight-bearing portion of the condyles by 4 mm on the sagittal reformats (image 55,series 6) will you. Fracture also extends into the lateral patellofemoral compartment. Lipohemarthrosis is noted. There is 1/4 shaft with displacement of the distal fracture fragment. A nondisplaced sagittally oriented posterior cortical fracture extends more proximally of the diaphysis of the femoral shaft, incompletely included that extends at least to the junction of the middle and distal third when correlated with same day radiographs. No tibial fracture. Ligaments Suboptimally assessed by CT. Muscles and Tendons Diffuse periarticular intramuscular edema without intramuscular hematoma. Soft tissues Lipohemarthrosis of the knee joint. Periarticular soft tissue edema. IMPRESSION: Acute, closed, comminuted and dorsally displaced T-shaped supracondylar fracture of the right femur extending into the femorotibial and patellofemoral compartments as described with associated lipohemarthrosis. No tibial fracture. There is a nondisplaced posterior diaphyseal fracture of the femoral shaft that extends more proximally at least to the junction of the middle and distal third. The proximal extent is not entirely included on this study of the knee. Electronically Signed   By: Ashley Royalty M.D.   On: 11/20/2016 19:27   Dg C-arm 1-60 Min  Result Date: 11/22/2016 CLINICAL DATA:  ORIF distal right femoral fracture EXAM: DG C-ARM 61-120 MIN; RIGHT KNEE - 3 VIEW  COMPARISON:  11/20/2016 FINDINGS: Plate and screw fixation across the distal right femoral fracture. Near anatomic alignment. No hardware complicating feature. IMPRESSION: Internal fixation across the distal right femoral fracture. No visible complicating feature. Electronically Signed   By: Rolm Baptise M.D.   On: 11/22/2016 13:50   Dg Femur Port, Min 2 Views Right  Result Date: 11/22/2016 CLINICAL DATA:  Postop check. EXAM: RIGHT FEMUR PORTABLE 2 VIEW COMPARISON:  11/20/2016 and 11/22/2016 FINDINGS: Examination demonstrates a lateral fixation plate with screws bridging patient's distal femoral fracture with hardware intact and near anatomic alignment over the fracture site. Skin staples are present over the lateral soft tissues. Separate orthopedic screw extending from anterior to posterior over the lateral femoral condyles intact. IMPRESSION: Post fixation of distal femoral fracture with hardware intact and near anatomic alignment about the fracture site. Electronically Signed   By: Marin Olp M.D.   On: 11/22/2016 17:00   Dg Knee 2 Views Right  Result Date: 11/22/2016 CLINICAL DATA:  ORIF distal right femoral fracture EXAM: DG C-ARM 61-120 MIN; RIGHT KNEE - 3 VIEW COMPARISON:  11/20/2016 FINDINGS: Plate and screw fixation across the distal right femoral fracture. Near anatomic alignment. No hardware complicating feature. IMPRESSION: Internal fixation across the distal right femoral fracture. No visible complicating feature. Electronically Signed   By: Rolm Baptise M.D.   On: 11/22/2016 13:50     LOS: 3 days   Oren Binet, MD  [facility] Pager:[phone]  If 7PM-7AM, please contact night-coverage [email or web address] Password [facility] 11/23/2016, 11:23 AM

## 2016-11-23 NOTE — Op Note (Signed)
NAMEJERUSALEM, [name] NO.:  [id number]  MEDICAL RECORD NO.:  [id number]  LOCATION:  [id number]                        FACILITY:  [facility]  PHYSICIAN:  Zachary Can, MD     DATE OF BIRTH:  May 20, 1946  DATE OF PROCEDURE:  11/22/2016 DATE OF DISCHARGE:                              OPERATIVE REPORT   SURGEON:  Zachary Can, MD.  ASSISTANT:  Zachary Highman, PA-C.  PREOPERATIVE DIAGNOSIS:  Comminuted right intra-articular distal femur fracture.  POSTOPERATIVE DIAGNOSIS:  Comminuted right intra-articular distal femur fracture.  PROCEDURE PERFORMED:  Open reduction and internal fixation of right distal femur fracture.  IMPLANTS: 1. Zimmer NCB periprosthetic distal femur plate 12 hole with 5.0 mm     Screws and locking caps x5. 2. 7.5 mm headless compression screws x3.  ANESTHESIA:  General.  ANTIBIOTICS:  2 g Ancef.  COMPLICATIONS:  None.  TUBES AND DRAINS:  None.  DISPOSITION:  Stable to PACU.  INDICATIONS:  The patient is a 71 year old mentally handicapped male who resides in a long-term nursing facility.  His legal guardian is the department of social services.  He is a diabetic and he has end-stage renal disease, on hemodialysis Tuesday, Thursday, and Saturday.  When he was walking out of dialysis this past Thursday, he tripped and fell, sustaining the above-mentioned injury.  He was admitted to [facility]. Workup revealed that he had a comminuted intra-articular right distal femur fracture.  He was then transferred to [facility] for definitive treatment.  I was asked to assume care of the patient by Dr. Marcelino [name] as the patient was not medically optimized for surgery prior to today. Consent was obtained from Zachary [name] after the risks, benefits, and alternatives were explained.  DESCRIPTION OF PROCEDURE IN DETAIL:  I identified the patient in the holding area and marked the surgical site.  He was taken to the operating room.  General  anesthesia was induced.  He was transferred to the operating room table.  All bony prominences were well padded.  The right lower extremity was prepped and draped in normal sterile surgical fashion.  Time-out was called verifying side and site of surgery.  He did receive IV antibiotics within 60 minutes beginning the procedure. Fracture was reduced on a triangle.  The fracture was impacted and there was a step-off on the lateral view. On AP view, alignment was acceptable. Due to the intra-articular nature of the fracture, which was confirmed on the CT scan, I elected to open this particular injury.  I performed a swashbuckler approach to the distal femur.  I divided the IT band.  I created a lateral peripatellar arthrotomy.  Care was taken not to damage the articular cartilage or the meniscus.  I reflected the vastus anteriorly.  I identified the fracture.  He had intra-articular comminution of the trochlea, was a separate piece that was sheared off in the coronal plane.  He also had a coronal fracture to the lateral femoral condyle.  I reassembled the articular pieces.  I held everything with a clamp.  I placed 2 headless compression screws lateral to medial to hold the trochlea to the medial and lateral femoral condyles.  I then placed a single screw from anterior to posterior in the lateral femoral condyle to compress across the coronal fracture site.  Once the articular block was reassembled, I then reduced the shaft onto the distal fragment.  I selected a 12-hole plate.  This was slid into place subperiosteally.  I then held the plate provisionally with a drill bit proximally with K-wires down distally.  I then put a total of 4 bicortical shaft screws proximally.  I put a total of 5 bicortical screws distally.  Plate compressed down to the bone very nicely.  Locking caps were placed.  I then obtained final AP and lateral fluoroscopy views to confirm hardware placement and fracture  reduction.  I took the knee through a gentle range of motion and the femur was 1 solid unit. The wound was then copiously irrigated with saline.  I then closed the wounds with #1 Vicryl and 0 V-Loc for the fascia and arthrotomy, 2-0 Vicryl for the deep dermal layer, and staples for the skin followed by glue.  Once the glue was dried, Aquacel and Ace wraps were applied.  The patient was then awakened, taken to PACU in stable condition.  Sponge, needle, and instrument counts were correct at the end of the case x2. There were no complications.  Final EBL was about 500 mL.  Postoperatively, we will readmit the patient to the Hospitalist Service. He will be strict nonweightbearing right lower extremity.  He will have physical therapy for bed-to-chair transfers and he may actually perform range of motion to the right knee.  We will place him on Coumadin for DVT prophylaxis for a total of 30 days and his target INR will be 1.7. I will need to see him in the office 2 weeks after discharge.          ______________________________ Zachary Can, MD     BS/MEDQ  D:  11/22/2016  T:  11/23/2016  Job:  [id number]

## 2016-11-23 NOTE — Progress Notes (Signed)
Physical Therapy Treatment Patient Details Name: Zachary [name] MRN: [id number] DOB: Jan 07, 1946 Today's Date: 11/23/2016    History of Present Illness Pt is a 71 y/o male s/p ORIF of distal femur fracture as a result of a mechanical fall. Pt has a past medical history including DM, MR, HLD, HTN, and ARF.    PT Comments    Was paged by nursing secretary to assist nursing with helping pt back to bed. Pt was placed on a sling in the recliner but it was not the appropriate sling. Pt had to be assisted by back to bed with stand pivot transfer via RW with max A x 2-3 this session. Pt was assisted back to bed with nursing.    Follow Up Recommendations  SNF     Equipment Recommendations  None recommended by PT (defer to next venue)    Recommendations for Other Services       Precautions / Restrictions Precautions Precautions: Fall Precaution Comments: fall resulted in current hospitalization Restrictions Weight Bearing Restrictions: Yes RLE Weight Bearing: Non weight bearing    Mobility  Bed Mobility Overal bed mobility: Needs Assistance Bed Mobility: Sit to Supine     Supine to sit: Mod assist;+2 for physical assistance;+2 for safety/equipment;HOB elevated Sit to supine: Mod assist;+2 for physical assistance   General bed mobility comments: Assist to bring LE's into bed and to guide trunk. Requries assistance to move up in bed, but is able to use UE's on railings to assist.   Transfers Overall transfer level: Needs assistance Equipment used: Rolling walker (2 wheeled) Transfers: Stand Pivot Transfers Sit to Stand: Max assist;+2 physical assistance;+2 safety/equipment;From elevated surface (Use RW) Stand pivot transfers: Max assist;+2 physical assistance (+3) Squat pivot transfers: Max assist;+2 physical assistance;+2 safety/equipment;From elevated surface (2 HHA)     General transfer comment: Pt requries assistance to maintain NWB on RLE. Requires +3 from recliner to bed  with RW.   Ambulation/Gait             General Gait Details: Unable to safely assess   Stairs            Wheelchair Mobility    Modified Rankin (Stroke Patients Only)       Balance Overall balance assessment: Needs assistance Sitting-balance support: Single extremity supported;Feet supported Sitting balance-Leahy Scale: Poor Sitting balance - Comments: sitting EOB with no back support Postural control: Left lateral lean Standing balance support: Bilateral upper extremity supported Standing balance-Leahy Scale: Zero Standing balance comment: depedent on therapists for support                    Cognition Arousal/Alertness: Awake/alert Behavior During Therapy: WFL for tasks assessed/performed Overall Cognitive Status: History of cognitive impairments - at baseline                      Exercises      General Comments        Pertinent Vitals/Pain Pain Assessment: Faces Faces Pain Scale: Hurts whole lot Pain Location: RLE Pain Descriptors / Indicators: Grimacing;Guarding;Moaning;Sore;Sharp Pain Intervention(s): Monitored during session;Limited activity within patient's tolerance;Repositioned    Home Living Family/patient expects to be discharged to:: Skilled nursing facility Living Arrangements: [place] Equipment: None      Prior Function Level of Independence: Needs assistance  Gait / Transfers Assistance Needed: Pt reports no DME necessary ADL's / Homemaking Assistance Needed: Pt reports no needs Comments: Pt  is poor historian   PT Goals (current goals can now be found in the care plan section) Acute Rehab PT Goals Patient Stated Goal: get better PT Goal Formulation: With patient Time For Goal Achievement: 11/30/16 Potential to Achieve Goals: Good Progress towards PT goals: Progressing toward goals    Frequency    Min 3X/week      PT Plan Current plan remains appropriate    Co-evaluation  PT/OT/SLP Co-Evaluation/Treatment: Yes Reason for Co-Treatment: Complexity of the patient's impairments (multi-system involvement) PT goals addressed during session: Mobility/safety with mobility OT goals addressed during session: ADL's and self-care     End of Session Equipment Utilized During Treatment: Gait belt Activity Tolerance: Patient limited by pain Patient left: in bed;with call bell/phone within reach;with nursing/sitter in room     Time: 1152-1202 PT Time Calculation (min) (ACUTE ONLY): 10 min  Charges:  $Therapeutic Activity: 8-22 mins                    G Codes:      Scheryl Marten PT, DPT  [phone]  11/23/2016, 12:27 PM

## 2016-11-23 NOTE — Progress Notes (Addendum)
ANTICOAGULATION CONSULT NOTE - Follow Up Consult  Pharmacy Consult:  Coumadin Indication:  VTE prophylaxis  No Known Allergies  Patient Measurements: Weight: 188 lb 0.8 oz (85.3 kg)  Vital Signs: BP: 99/48 (01/28 0407) Pulse Rate: 111 (01/28 0407)  Labs:  Recent Labs  11/20/16 1446 11/20/16 2303 11/21/16 0556 11/21/16 1325 11/22/16 0812 11/23/16 0502  HGB 9.0* 8.5* 7.5*  --  9.7*  --   HCT 28.6* 26.2* 23.0*  --  29.9*  --   PLT 176 143* 129*  --  125*  --   LABPROT 13.2  --   --   --   --  16.1*  INR 1.00  --   --   --   --  1.28  CREATININE 5.39* 5.38* 5.50* 5.75* 4.58* 6.26*    Estimated Creatinine Clearance: 11.8 mL/min (by C-G formula based on SCr of 6.26 mg/dL (H)).     Assessment: 89 YOM presented with femur fracture from fall after dialysis session.  Now s/p ORIF and Pharmacy consulted to manage Coumadin for VTE prophylaxis.  Patient appears to be sensitive to Coumadin since INR trended up post one dose of Coumadin given less than 12 hours ago.   Patient has anemia and his Aranesp dose was not charted as given yesterday (off schedule for dialysis).  No bleeding reported.   Goal of Therapy:  INR goal 1.7 per MD, aim for 1.5-2   Plan:  - Coumadin 3mg  PO today - Daily PT / INR - Coumadin book - Consider supplementing iron with IV formulation   Ori Trejos D. Mina Marble, PharmD, BCPS Pager:  [phone] 11/23/2016, 8:14 AM

## 2016-11-23 NOTE — Progress Notes (Signed)
Upon initial assessment . Pt was noted to have a foley in which dayshift RN made me aware that he came from group home with - on admission.She informed me that md was aware- no intervention noted . Due to post surgical protocol for foley discontinuation on post op day one and the fact that pt came in with the foley I have resolved to allow dayshift RN to discuss this with md regarding removal  Urine is dark color / thick  with foul odor . Pt was noted drinking multiple cokes- In which I informed him not to drink as this was not apart of his renal diet and could be contributing to the dark color of his urine along with other factors . Pt is a dialysis pt and close monitoring was done of his intake /output . Due to the pt having decreased urinary output as a result of having compromised renal function , it is concluded that the foley may not be of necessity. Will continue to monitor and follow up with am RN regarding  Notifying md for foley removal .

## 2016-11-23 NOTE — Progress Notes (Signed)
[facility] Rounding Note Subjective/Interval History:  Says is feeling just fine Leg pain controlled Doesn't like the food here (likes chicken pot pie!)  Objective Vital signs in last 24 hours: Vitals:   11/22/16 1723 11/22/16 1942 11/23/16 0407 11/23/16 1151  BP: 125/62 (!) 108/52 (!) 99/48 (!) 115/55  Pulse: 91 64 (!) 111   Resp: 18     Temp:  99.5 F (37.5 C)    TempSrc:  Oral    SpO2:  97% 91%   Weight:       Weight change:   Intake/Output Summary (Last 24 hours) at 11/23/16 1213 Last data filed at 11/22/16 1700  Gross per 24 hour  Intake             1390 ml  Output              600 ml  Net              790 ml   Physical Exam:  Blood pressure (!) 115/55, pulse (!) 111, temperature 99.5 F (37.5 C), temperature source Oral, resp. rate 18, weight 85.3 kg (188 lb 0.8 oz), SpO2 91 %.  Very nice, very simple AAM NAD, pleasant VS as noted Lungs clear S1S2 No S3 No murmur Abd soft and NT RLE -> dsgs in place - ACE wrap from hip to foot Foley (am told is chronic indwelling and he says "stays in there all the time")- not sure why he needs and neither is he - urine is green and malodorous RUE AVF good bruit and thrill, bandaids on from last HD  Labs:  Recent Labs Lab 11/20/16 1446 11/20/16 2303 11/21/16 0556 11/21/16 1325 11/22/16 0812 11/23/16 0502  NA 140  --  139 139 139 136  K 5.5*  --  5.4* 5.6* 4.4 5.1  CL 104  --  105 106 100* 101  CO2 28  --  24 24 28 25   GLUCOSE 118*  --  91 96 110* 143*  BUN 46*  --  48* 49* 28* 43*  CREATININE 5.39* 5.38* 5.50* 5.75* 4.58* 6.26*  CALCIUM 9.0  --  8.7* 8.5* 8.9 8.4*  PHOS  --   --   --  5.0* 4.9* 6.7*     Recent Labs Lab 11/20/16 1446 11/21/16 1325 11/22/16 0812 11/23/16 0502  AST 27  --   --   --   ALT 33  --   --   --   ALKPHOS 77  --   --   --   BILITOT 0.4  --   --   --   PROT 7.1  --   --   --   ALBUMIN 3.7 2.8* 2.9* 2.8*    Recent Labs Lab 11/20/16 1446 11/20/16 2303 11/21/16 0556 11/22/16 0812  11/23/16 0742  WBC 8.8 8.0 6.3 9.2 11.9*  NEUTROABS 6.4  --   --   --   --   HGB 9.0* 8.5* 7.5* 9.7* 7.7*  HCT 28.6* 26.2* 23.0* 29.9* 23.1*  MCV 110.9* 106.1* 106.0* 101.4* 100.0  PLT 176 143* 129* 125* 124*    Recent Labs Lab 11/22/16 1407 11/22/16 1652 11/22/16 2121 11/23/16 0632 11/23/16 1154  GLUCAP 100* 117* 124* 143* 138*    Studies/Results: Dg C-arm 1-60 Min  Result Date: 11/22/2016 CLINICAL DATA:  ORIF distal right femoral fracture EXAM: DG C-ARM 61-120 MIN; RIGHT KNEE - 3 VIEW COMPARISON:  11/20/2016 FINDINGS: Plate and screw fixation across the distal right femoral fracture. Near  anatomic alignment. No hardware complicating feature. IMPRESSION: Internal fixation across the distal right femoral fracture. No visible complicating feature. Electronically Signed   By: Rolm Baptise M.D.   On: 11/22/2016 13:50   Dg Femur Port, Min 2 Views Right  Result Date: 11/22/2016 CLINICAL DATA:  Postop check. EXAM: RIGHT FEMUR PORTABLE 2 VIEW COMPARISON:  11/20/2016 and 11/22/2016 FINDINGS: Examination demonstrates a lateral fixation plate with screws bridging patient's distal femoral fracture with hardware intact and near anatomic alignment over the fracture site. Skin staples are present over the lateral soft tissues. Separate orthopedic screw extending from anterior to posterior over the lateral femoral condyles intact. IMPRESSION: Post fixation of distal femoral fracture with hardware intact and near anatomic alignment about the fracture site. Electronically Signed   By: Marin Olp M.D.   On: 11/22/2016 17:00   Dg Knee 2 Views Right  Result Date: 11/22/2016 CLINICAL DATA:  ORIF distal right femoral fracture EXAM: DG C-ARM 61-120 MIN; RIGHT KNEE - 3 VIEW COMPARISON:  11/20/2016 FINDINGS: Plate and screw fixation across the distal right femoral fracture. Near anatomic alignment. No hardware complicating feature. IMPRESSION: Internal fixation across the distal right femoral fracture. No  visible complicating feature. Electronically Signed   By: Rolm Baptise M.D.   On: 11/22/2016 13:50   Medications:  . atorvastatin  40 mg Oral QPM  . carvedilol  6.25 mg Oral BID WC  .  ceFAZolin (ANCEF) IV  2 g Intravenous Q12H  . coumadin book   Does not apply Once  . darbepoetin (ARANESP) injection - DIALYSIS  150 mcg Intravenous Q Sat-HD  . doxercalciferol  1 mcg Intravenous Q T,Th,Sa-HD  . ferrous sulfate  325 mg Oral Q breakfast  . insulin aspart  0-5 Units Subcutaneous QHS  . insulin aspart  0-9 Units Subcutaneous TID WC  . isosorbide-hydrALAZINE  1 tablet Oral TID  . levothyroxine  75 mcg Oral QAC breakfast  . niacin  1,000 mg Oral QHS  . pantoprazole  40 mg Oral Daily  . simvastatin  20 mg Oral QHS  . sodium polystyrene  30 g Oral Q M,W,F  . tamsulosin  0.4 mg Oral Daily  . warfarin  3 mg Oral ONCE-1800  . 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 Transferred here for surgery, we are seeing to provide his dialysis. Resides in SNF, has legal guardian  Assessment/Recommendations  1. ESRD - Usual TTS. Had HD Friday off schedule. K up a little today so will plan for HD Monday, then again for short treatment on Tuesday to get back on usual TTS schedule.  2. Comminuted R femur fracture - s/p ORIF.  3. Anemia - ABLA + ESRD. Converted his usual EPO to Aranesp (at higher dose equivalent). Transfused 2 units on HD 1/26. EBL with surgery 500 ml. Give additional PRBC's with next HD. Continue Aranesp. Iron load - giving daily dosing since HD is off scheldue (serum Fe <5!) 4. DM - per primary 5. Hypothyroidism - on  replacement 6. HLD - statin 7. Indwelling foley - no info as to why needed but am told came from group home  with it.  8. Mental retardation - [facility] home (though is thought to be group home) will need to clarify this because if group home will not be able to get necessary PT, etc there. Has legal guardian in [place], [facility] [phone] pager 11/23/2016, 12:13 PM

## 2016-11-23 NOTE — Evaluation (Signed)
Occupational Therapy Evaluation Patient Details Name: Zachary [name] MRN: [id number] DOB: 02-07-1946 Today's Date: 11/23/2016    History of Present Illness Pt is a 71 y/o male s/p ORIF of distal femur fracture as a result of a mechanical fall. Pt has a past medical history including DM, MR, HLD, HTN, and ARF.   Clinical Impression   PTA Pt reports that he was independent in ADL and mobility (Pt is poor historian). Pt max assist for ADL and +2 for transfers. Pt with TDWB this session, despite max VC. Pt will benefit from skilled OT in the acute setting prior to dc to venue below to maximize independence and safety in ADL and transfers. Next session to focus on LB ADL and transfers.     Follow Up Recommendations  SNF;Supervision/Assistance - 24 hour    Equipment Recommendations  Other (comment) (defer to next venue)    Recommendations for Other Services       Precautions / Restrictions Precautions Precautions: Fall Restrictions Weight Bearing Restrictions: Yes RLE Weight Bearing: Non weight bearing      Mobility Bed Mobility Overal bed mobility: Needs Assistance Bed Mobility: Supine to Sit     Supine to sit: Mod assist;+2 for physical assistance;+2 for safety/equipment;HOB elevated     General bed mobility comments: Assist for BLE to come edge of bed, use of bed pad from therapist to assist with positioing and support for trunk to come upright, verbal cues for use of bed rail to assist  Transfers Overall transfer level: Needs assistance Equipment used: Rolling walker (2 wheeled);2 person hand held assist Transfers: Sit to/from [employer] Sit to Stand: Max assist;+2 physical assistance;+2 safety/equipment;From elevated surface (used RW)   Squat pivot transfers: Max assist;+2 physical assistance;+2 safety/equipment;From elevated surface (2 HHA)     General transfer comment: Pt not able to maintain NWB this session. max verbal cues, and physical assist  needed.     Balance Overall balance assessment: Needs assistance Sitting-balance support: Single extremity supported;Feet supported Sitting balance-Leahy Scale: Poor Sitting balance - Comments: sitting EOB with no back support Postural control: Left lateral lean Standing balance support: Bilateral upper extremity supported Standing balance-Leahy Scale: Zero Standing balance comment: depedent on therapists for support                            ADL Overall ADL's : Needs assistance/impaired   Eating/Feeding Details (indicate cue type and reason): Pt had therapy take away his tray stating "I don't want to eat that" Grooming: Wash/dry hands;Wash/dry face;Set up;Sitting Grooming Details (indicate cue type and reason): in recliner Upper Body Bathing: Moderate assistance   Lower Body Bathing: Maximal assistance   Upper Body Dressing : Moderate assistance   Lower Body Dressing: Maximal assistance   Toilet Transfer: +2 for physical assistance;+2 for safety/equipment;Maximal assistance;Squat-pivot;BSC Toilet Transfer Details (indicate cue type and reason): simulated with recliner,    Toileting - Clothing Manipulation Details (indicate cue type and reason): Pt has foley cath in place     Functional mobility during ADLs:  (not attempted this session sit <> stand and squat pivot only) General ADL Comments: Pt anxious about moving, but does well when you talk him through and offer encouragement     Vision     Perception     Praxis      Pertinent Vitals/Pain Pain Assessment: Faces Faces Pain Scale: Hurts whole lot Pain Location: RLE Pain Descriptors / Indicators: Grimacing;Guarding;Moaning;Sore;Sharp (sharp with  movement) Pain Intervention(s): Limited activity within patient's tolerance;Monitored during session;Repositioned;Premedicated before session     Hand Dominance     Extremity/Trunk Assessment Upper Extremity Assessment Upper Extremity Assessment:  Generalized weakness   Lower Extremity Assessment Lower Extremity Assessment: RLE deficits/detail RLE Deficits / Details: post op deficits in strength and ROM RLE: Unable to fully assess due to pain   Cervical / Trunk Assessment Cervical / Trunk Assessment: Normal   Communication Communication Communication: Other (comment) (baseline speech deficits)   Cognition Arousal/Alertness: Awake/alert Behavior During Therapy: WFL for tasks assessed/performed Overall Cognitive Status: History of cognitive impairments - at baseline                     General Comments       Exercises       Shoulder Instructions      Home Living Family/patient expects to be discharged to:: Skilled nursing facility Living Arrangements: Group Home                                      Prior Functioning/Environment Level of Independence: Needs assistance  Gait / Transfers Assistance Needed: Pt reports no DME necessary ADL's / Homemaking Assistance Needed: Pt reports no needs   Comments: Pt is poor historian        OT Problem List: Decreased strength;Decreased range of motion;Decreased activity tolerance;Impaired balance (sitting and/or standing);Decreased safety awareness;Decreased knowledge of use of DME or AE;Decreased knowledge of precautions;Pain   OT Treatment/Interventions: Self-care/ADL training;DME and/or AE instruction;Therapeutic activities;Patient/family education;Balance training;Therapeutic exercise    OT Goals(Current goals can be found in the care plan section) Acute Rehab OT Goals Patient Stated Goal: get better OT Goal Formulation: With patient Time For Goal Achievement: 12/07/16 Potential to Achieve Goals: Good ADL Goals Pt Will Perform Lower Body Bathing: with min guard assist;with caregiver independent in assisting;with adaptive equipment;sitting/lateral leans Pt Will Transfer to Toilet: with mod assist;squat pivot transfer;bedside commode Additional  ADL Goal #1: Pt will maintain NWB during functional transfers without verbal cues  OT Frequency: Min 2X/week   Barriers to D/C: Other (comment) (came from ALF/group home)          Co-evaluation PT/OT/SLP Co-Evaluation/Treatment: Yes Reason for Co-Treatment: Complexity of the patient's impairments (multi-system involvement);For patient/therapist safety;To address functional/ADL transfers;Necessary to address cognition/behavior during functional activity PT goals addressed during session: Mobility/safety with mobility OT goals addressed during session: ADL's and self-care      End of Session Equipment Utilized During Treatment: Gait belt;Rolling walker Nurse Communication: Mobility status;Weight bearing status  Activity Tolerance: Patient tolerated treatment well Patient left: in chair;with call bell/phone within reach;with chair alarm set;Other (comment) (lift pad underneath Pt for nursing)   Time: 3419-6222 OT Time Calculation (min): 35 min Charges:  OT Evaluation $OT Eval Moderate Complexity: 1 Procedure G-Codes:    Merri Ray Sharvil Hoey 04-Dec-2016, 10:47 AM Hulda Humphrey OTR/L [phone]

## 2016-11-24 DIAGNOSIS — E875 Hyperkalemia: Secondary | ICD-10-CM

## 2016-11-24 DIAGNOSIS — I10 Essential (primary) hypertension: Secondary | ICD-10-CM

## 2016-11-24 LAB — RENAL FUNCTION PANEL
Albumin: 2.5 g/dL — ABNORMAL LOW (ref 3.5–5.0)
Anion gap: 13 (ref 5–15)
BUN: 55 mg/dL — ABNORMAL HIGH (ref 6–20)
CO2: 23 mmol/L (ref 22–32)
Calcium: 8.3 mg/dL — ABNORMAL LOW (ref 8.9–10.3)
Chloride: 100 mmol/L — ABNORMAL LOW (ref 101–111)
Creatinine, Ser: 7.68 mg/dL — ABNORMAL HIGH (ref 0.61–1.24)
GFR calc Af Amer: 7 mL/min — ABNORMAL LOW (ref >60)
GFR calc non Af Amer: 6 mL/min — ABNORMAL LOW (ref >60)
Glucose, Bld: 91 mg/dL (ref 65–99)
Phosphorus: 6.9 mg/dL — ABNORMAL HIGH (ref 2.5–4.6)
Potassium: 5.6 mmol/L — ABNORMAL HIGH (ref 3.5–5.1)
Sodium: 136 mmol/L (ref 135–145)

## 2016-11-24 LAB — CBC
HCT: 21.8 % — ABNORMAL LOW (ref 39.0–52.0)
Hemoglobin: 7.1 g/dL — ABNORMAL LOW (ref 13.0–17.0)
MCH: 32.4 pg (ref 26.0–34.0)
MCHC: 32.6 g/dL (ref 30.0–36.0)
MCV: 99.5 fL (ref 78.0–100.0)
Platelets: 137 10*3/uL — ABNORMAL LOW (ref 150–400)
RBC: 2.19 MIL/uL — ABNORMAL LOW (ref 4.22–5.81)
RDW: 17.8 % — ABNORMAL HIGH (ref 11.5–15.5)
WBC: 12.8 10*3/uL — ABNORMAL HIGH (ref 4.0–10.5)

## 2016-11-24 LAB — GLUCOSE, CAPILLARY
Glucose-Capillary: 113 mg/dL — ABNORMAL HIGH (ref 65–99)
Glucose-Capillary: 89 mg/dL (ref 65–99)
Glucose-Capillary: 128 mg/dL — ABNORMAL HIGH (ref 65–99)
Glucose-Capillary: 122 mg/dL — ABNORMAL HIGH (ref 65–99)

## 2016-11-24 LAB — PROTIME-INR
INR: 1.44
Prothrombin Time: 17.6 seconds — ABNORMAL HIGH (ref 11.4–15.2)

## 2016-11-24 MED ORDER — SODIUM CHLORIDE 0.9 % IV SOLN: Freq: Once | INTRAVENOUS | Status: DC

## 2016-11-24 MED ORDER — DARBEPOETIN ALFA 150 MCG/0.3ML IJ SOSY
PREFILLED_SYRINGE | INTRAMUSCULAR | Status: AC
  Filled 2016-11-24: qty 0.3

## 2016-11-24 MED ORDER — DARBEPOETIN ALFA 150 MCG/0.3ML IJ SOSY
150.0000 ug | PREFILLED_SYRINGE | INTRAMUSCULAR | Status: DC
  Administered 2016-11-24: 150 ug via INTRAVENOUS

## 2016-11-24 MED ORDER — RENA-VITE PO TABS
1.0000 | ORAL_TABLET | Freq: Every day | ORAL | Status: DC
  Administered 2016-11-24 – 2016-11-25 (×2): 1 via ORAL
  Filled 2016-11-24 (×2): qty 1

## 2016-11-24 MED ORDER — WARFARIN SODIUM 3 MG PO TABS
3.0000 mg | ORAL_TABLET | Freq: Once | ORAL | Status: AC
  Administered 2016-11-24: 3 mg via ORAL
  Filled 2016-11-24: qty 1

## 2016-11-24 NOTE — Procedures (Signed)
I was present at this session.  I have reviewed the session itself and made appropriate changes.  Hd via RUA AVF, getting TX,  bp low, will not get goal, needs less bp meds.  Zachary [name] L 1/29/201810:36 AM

## 2016-11-24 NOTE — Progress Notes (Signed)
Patient removed foley. MD notified and ordered RN to leave foley out. MD will address during morning rounds. Nursing will continue to monitor.

## 2016-11-24 NOTE — Consult Note (Signed)
[facility] CM Primary Care Navigator  11/24/2016  TIGHE GITTO 09-19-1946 [id number]  Met with patient at the bedside to identify possible discharge needs. Patient reports he had a fall walking into the facility after finishing dialysis and was found to have a right femur fracture that led to this admission/surgery.  Patient confirms Dr. [name] with [facility] office as the primary care provider.    Patient shared that he resides at [place] facility for about 4 years using their pharmacy and his medications are being managed by staff (nurse) there.  He reports that facility [facility]) provides transportation to his doctors' appointments and dialysis.  He states that his care needs are being provided at this facility.  Anticipated discharge plan is skilled nursing facility (none specific yet) per PT recommendation to maximize his functional outcomes.   Patient voiced understanding to call primary care provider's office when he returns back to the [facility], for a post discharge follow-up appointment within a week or sooner if needs arise. Patient letter was provided (with PCP's contact number) as a reminder.  Patient is taking Tradjenta for history of diabetes with recent A1c of 5.0.  No further needs or concerns voiced by patient at this time.  For additional questions please contact:  Edwena Felty A. Mykaylah Ballman, BSN, RN-BC [facility] PRIMARY CARE Navigator Cell: [phone]

## 2016-11-24 NOTE — NC FL2 (Addendum)
[place] LEVEL OF CARE SCREENING TOOL     IDENTIFICATION  Patient Name: Zachary [name] Birthdate: 06/18/1946 Sex: male Admission Date (Current Location): 11/20/2016  [facility] and [place] Number:  [employer] and Address:  [place]. [facility], [place] [address], [place], [place] [address]      Provider Number: [id number]  Attending Physician Name and Address:  Jonetta Osgood, MD  Relative Name and Phone Number:  Radene Gunning, Legal Guardian     Current Level of Care: Hospital Recommended Level of Care: [place] Prior Approval Number:    Date Approved/Denied:   PASRR Number: [id number]    Discharge Plan: SNF    Current Diagnoses: Patient Active Problem List   Diagnosis Date Noted  . Closed comminuted intra-articular fracture of distal femur, right, initial encounter ([place]) 11/22/2016  . Right femoral fracture ([place]) 11/20/2016  . AKI (acute kidney injury) ([place]) 04/03/2016  . Uremia 03/10/2016  . Hydronephrosis, bilateral 03/10/2016  . Acute on chronic renal failure ([place]) 03/10/2016  . End stage renal disease ([place]) 10/08/2015  . 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 Left arm pain 09/27/2015  . Cold sensation of skin-Left Hand 09/27/2015  . Left arm swelling 09/27/2015  . Pyelonephritis 09/13/2015  . UTI (lower urinary tract infection) 07/20/2015  . Heme positive stool   . Absolute anemia   . Abnormal CT scan, esophagus   . Protein-calorie malnutrition, severe ([place]) 07/10/2015  . Acute renal failure superimposed on stage 4 chronic kidney disease ([place]) 07/08/2015  . Nausea and vomiting 07/08/2015  . Essential hypertension 07/08/2015  . Hypernatremia 07/08/2015  . Hyperlipidemia 07/08/2015  . Nausea & vomiting 07/08/2015  . Hyperkalemia 05/22/2015  . Obstructive uropathy 04/17/2015  . ARF (acute renal failure) ([place]) 04/17/2015  . Bladder neck contracture   . Urinary retention   . Anemia,  chronic disease 04/16/2015  . Benign prostatic hyperplasia with urinary retention 03/03/2015  . BPH (benign prostatic hypertrophy) with urinary obstruction 01/04/2014  . BPH (benign prostatic hypertrophy) 12/29/2013  . Acute renal failure ([place]) 12/25/2013  . Renal insufficiency 12/25/2013  . Dehydration 12/25/2013  . Mental retardation 10/10/2011  . Acute pyelonephritis 10/07/2011  . 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 Bladder wall thickening 10/06/2011  . DM (diabetes mellitus), type 2, uncontrolled ([place]) 10/02/2011  . Iron deficiency anemia 10/02/2011  . Bacteremia 10/02/2011  . FX CLOSED FIBULA NOS 02/10/2008    Orientation RESPIRATION BLADDER Height & Weight      (Has MR)  Normal Continent, Indwelling catheter Weight: 80.4 kg (177 lb 4 oz) Height:     BEHAVIORAL SYMPTOMS/MOOD NEUROLOGICAL BOWEL NUTRITION STATUS      Continent Diet (Please see DC Summary)  AMBULATORY STATUS COMMUNICATION OF NEEDS Skin   Extensive Assist Verbally Surgical wounds (Closed incision on leg and arm)                       Personal Care Assistance Level of Assistance  Bathing, Feeding, Dressing Bathing Assistance: Maximum assistance Feeding assistance: Limited assistance Dressing Assistance: Limited assistance     Functional Limitations Info             SPECIAL CARE FACTORS FREQUENCY  PT (By licensed PT), OT (By licensed OT)     PT Frequency: 5x/week OT Frequency: 3x/week            Contractures      Additional Factors Info  Code Status, Allergies, Insulin Sliding  Scale Code Status Info: Full Allergies Info: NKA   Insulin Sliding Scale Info: 3x daily; at bedtime       Current Medications (11/24/2016):  This is the current hospital active medication list Current Facility-Administered Medications  Medication Dose Route Frequency Provider Last Rate Last Dose  . 0.9 %  sodium chloride infusion   Intravenous Once Jamal Maes, MD      . 0.9 %  sodium chloride infusion    Intravenous Once Jamal Maes, MD      . acetaminophen (TYLENOL) tablet 650 mg  650 mg Oral Q6H PRN Rod Can, MD   650 mg at 11/24/16 0602   Or  . acetaminophen (TYLENOL) suppository 650 mg  650 mg Rectal Q6H PRN Rod Can, MD      . atorvastatin (LIPITOR) tablet 40 mg  40 mg Oral QPM Rod Can, MD   40 mg at 11/23/16 1728  . Darbepoetin Alfa (ARANESP) injection 150 mcg  150 mcg Intravenous Q Mon-HD Mauricia Area, MD   150 mcg at 11/24/16 1042  . doxercalciferol (HECTOROL) injection 1 mcg  1 mcg Intravenous Q T,Th,Sa-HD Jamal Maes, MD      . ferric gluconate (NULECIT) 125 mg in sodium chloride 0.9 % 100 mL IVPB  125 mg Intravenous Daily Jamal Maes, MD   125 mg at 11/24/16 1230  . HYDROcodone-acetaminophen (NORCO/VICODIN) 5-325 MG per tablet 1-2 tablet  1-2 tablet Oral Q4H PRN Thurnell Lose, MD   2 tablet at 11/23/16 1216  . insulin aspart (novoLOG) injection 0-5 Units  0-5 Units Subcutaneous QHS Thurnell Lose, MD      . insulin aspart (novoLOG) injection 0-9 Units  0-9 Units Subcutaneous TID WC Thurnell Lose, MD   1 Units at 11/23/16 1729  . levothyroxine (SYNTHROID, LEVOTHROID) tablet 75 mcg  75 mcg Oral QAC breakfast Thurnell Lose, MD   75 mcg at 11/23/16 0835  . menthol-cetylpyridinium (CEPACOL) lozenge 3 mg  1 lozenge Oral PRN Rod Can, MD       Or  . phenol (CHLORASEPTIC) mouth spray 1 spray  1 spray Mouth/Throat PRN Rod Can, MD      . metoCLOPramide (REGLAN) tablet 5-10 mg  5-10 mg Oral Q8H PRN Rod Can, MD       Or  . metoCLOPramide (REGLAN) injection 5-10 mg  5-10 mg Intravenous Q8H PRN Rod Can, MD      . metoprolol (LOPRESSOR) injection 5 mg  5 mg Intravenous Q4H PRN Thurnell Lose, MD      . morphine 2 MG/ML injection 1 mg  1 mg Intravenous Q4H PRN Thurnell Lose, MD   1 mg at 11/22/16 0636  . multivitamin (RENA-VIT) tablet 1 tablet  1 tablet Oral QHS Mauricia Area, MD      . niacin (NIASPAN) CR tablet 1,000 mg   1,000 mg Oral QHS Thurnell Lose, MD   1,000 mg at 11/23/16 2050  . ondansetron (ZOFRAN) tablet 4 mg  4 mg Oral Q6H PRN Rod Can, MD       Or  . ondansetron [facility]) injection 4 mg  4 mg Intravenous Q6H PRN Rod Can, MD      . pantoprazole (PROTONIX) EC tablet 40 mg  40 mg Oral Daily Thurnell Lose, MD   40 mg at 11/24/16 1309  . simvastatin (ZOCOR) tablet 20 mg  20 mg Oral QHS Thurnell Lose, MD   20 mg at 11/23/16 2050  . sodium polystyrene (KAYEXALATE) 15 GM/60ML  suspension 30 g  30 g Oral Q M,W,F Thurnell Lose, MD      . tamsulosin (FLOMAX) capsule 0.4 mg  0.4 mg Oral Daily Thurnell Lose, MD   0.4 mg at 11/24/16 1308  . warfarin (COUMADIN) tablet 3 mg  3 mg Oral ONCE-1800 Romona Curls, [facility]      . Warfarin - Pharmacist Dosing Inpatient   Does not apply q1800 Wynell Balloon, [facility]   1 each at 11/22/16 1724     Discharge Medications: Please see discharge summary for a list of discharge medications.  Relevant Imaging Results:  Relevant Lab Results:   Additional Information SSN# [id number]. [place] Rayyan, LCSWA

## 2016-11-24 NOTE — Progress Notes (Signed)
ANTICOAGULATION CONSULT NOTE - Follow Up Consult  Pharmacy Consult:  Coumadin Indication:  VTE prophylaxis  No Known Allergies  Patient Measurements: Weight: 177 lb 4 oz (80.4 kg)  Vital Signs: Temp: 98.6 F (37 C) (01/29 1225) Temp Source: Oral (01/29 1225) BP: 105/59 (01/29 1225) Pulse Rate: 103 (01/29 1225)  Labs:  Recent Labs  11/22/16 7948 11/23/16 0502 11/23/16 0742 11/24/16 0339  HGB 9.7*  --  7.7* 7.1*  HCT 29.9*  --  23.1* 21.8*  PLT 125*  --  124* 137*  LABPROT  --  16.1*  --  17.6*  INR  --  1.28  --  1.44  CREATININE 4.58* 6.26*  --  7.68*    Estimated Creatinine Clearance: 9.3 mL/min (by C-G formula based on SCr of 7.68 mg/dL (H)).     Assessment: 46 YOM presented with femur fracture from fall after dialysis session. Now s/p ORIF, and Pharmacy consulted to manage Coumadin for VTE prophylaxis x 30 days from 11/22/16 - target INR 1.7 per Ortho note. INR up to 1.44. Hg 7.1 - 2u PRBC with HD on 1/29. Plt trend up to 137. No bleed documented.   Goal of Therapy:  INR goal 1.7 per MD, aim for 1.5-2  Monitor platelets by anticoagulation protocol:  yes   Plan:  - Coumadin 3mg  PO today - Daily PT / INR - Monitor CBC, s/sx bleeding   Elicia Lamp, PharmD, BCPS Clinical Pharmacist 11/24/2016 1:10 PM

## 2016-11-24 NOTE — Progress Notes (Signed)
Subjective: Interval History: has no complaint .  Objective: Vital signs in last 24 hours: Temp:  [98.9 F (37.2 C)-100.9 F (38.3 C)] 98.9 F (37.2 C) (01/29 1020) Pulse Rate:  [60-113] 108 (01/29 1020) Resp:  [13-18] 16 (01/29 1005) BP: (90-115)/(46-65) 100/57 (01/29 1020) SpO2:  [95 %-99 %] 99 % (01/29 1005) Weight:  [83.3 kg (183 lb 10.3 oz)-86.1 kg (189 lb 13.1 oz)] 83.3 kg (183 lb 10.3 oz) (01/29 0810) Weight change:   Intake/Output from previous day: 01/28 0701 - 01/29 0700 In: 640 [P.O.:530; IV Piggyback:110] Out: 200 [Urine:200] Intake/Output this shift: Total I/O In: 535 [P.O.:200; Blood:335] Out: -   General appearance: alert, cooperative and no distress Resp: rales bibasilar Cardio: S1, S2 normal and systolic murmur: systolic ejection 2/6, decrescendo at 2nd left intercostal space GI: soft, non-tender; bowel sounds normal; no masses,  no organomegaly Extremities: AVF RUA, ACEi on R leg  Lab Results:  Recent Labs  11/23/16 0742 11/24/16 0339  WBC 11.9* 12.8*  HGB 7.7* 7.1*  HCT 23.1* 21.8*  PLT 124* 137*   BMET:  Recent Labs  11/23/16 0502 11/24/16 0339  NA 136 136  K 5.1 5.6*  CL 101 100*  CO2 25 23  GLUCOSE 143* 91  BUN 43* 55*  CREATININE 6.26* 7.68*  CALCIUM 8.4* 8.3*   No results for input(s): PTH in the last 72 hours. Iron Studies:  Recent Labs  11/23/16 0502  IRON <5*  TIBC 148*    Studies/Results: Dg C-arm 1-60 Min  Result Date: 11/22/2016 CLINICAL DATA:  ORIF distal right femoral fracture EXAM: DG C-ARM 61-120 MIN; RIGHT KNEE - 3 VIEW COMPARISON:  11/20/2016 FINDINGS: Plate and screw fixation across the distal right femoral fracture. Near anatomic alignment. No hardware complicating feature. IMPRESSION: Internal fixation across the distal right femoral fracture. No visible complicating feature. Electronically Signed   By: Rolm Baptise M.D.   On: 11/22/2016 13:50   Dg Femur Port, Min 2 Views Right  Result Date:  11/22/2016 CLINICAL DATA:  Postop check. EXAM: RIGHT FEMUR PORTABLE 2 VIEW COMPARISON:  11/20/2016 and 11/22/2016 FINDINGS: Examination demonstrates a lateral fixation plate with screws bridging patient's distal femoral fracture with hardware intact and near anatomic alignment over the fracture site. Skin staples are present over the lateral soft tissues. Separate orthopedic screw extending from anterior to posterior over the lateral femoral condyles intact. IMPRESSION: Post fixation of distal femoral fracture with hardware intact and near anatomic alignment about the fracture site. Electronically Signed   By: Marin Olp M.D.   On: 11/22/2016 17:00   Dg Knee 2 Views Right  Result Date: 11/22/2016 CLINICAL DATA:  ORIF distal right femoral fracture EXAM: DG C-ARM 61-120 MIN; RIGHT KNEE - 3 VIEW COMPARISON:  11/20/2016 FINDINGS: Plate and screw fixation across the distal right femoral fracture. Near anatomic alignment. No hardware complicating feature. IMPRESSION: Internal fixation across the distal right femoral fracture. No visible complicating feature. Electronically Signed   By: Rolm Baptise M.D.   On: 11/22/2016 13:50    I have reviewed the patient's current medications.  Assessment/Plan: 1 ESRD for HD. bp low 2 Anemia getting blood, needs Fe, getting, and esa 3 HTN stop meds 4 Femur FX  5 DM controlled P HD, esa, Fe, transfuse.   LOS: 4 days   Mardella Nuckles L 11/24/2016,10:37 AM

## 2016-11-24 NOTE — Clinical Social Work Note (Signed)
Clinical Social Work Assessment  Patient Details  Name: Zachary [name] MRN: [id number] Date of Birth: 02-26-1946  Date of referral:  11/24/16               Reason for consult:  Facility Placement                Permission sought to share information with:  Facility Sport and exercise psychologist, [place] granted to share information::  No  Name::     Animator::  SNFs  Relationship::  Legal Guardian  Contact Information:  [phone]  Housing/Transportation Living arrangements for the past 2 months:  [place] of Information:  Guardian Patient Interpreter Needed:  None Criminal Activity/Legal Involvement Pertinent to Current Situation/Hospitalization:  No - Comment as needed Significant Relationships:  Siblings Lives with:  Facility Resident Do you feel safe going back to the place where you live?  No Need for family participation in patient care:  Yes (Comment)  Care giving concerns:  CSW received consult for possible SNF placement at time of discharge. CSW spoke with patient's legal guardian (DSS), Malinda, regarding PT recommendation of SNF placement at time of discharge. Malinda reported that patient resides at [facility], but they are currently unable to care for patient given patient's current physical needs and fall risk. Malinda expressed understanding of PT recommendation and is agreeable to SNF placement at time of discharge. CSW to continue to follow and assist with discharge planning needs.   Social Worker assessment / plan:  CSW spoke with patient's guardian Cinda [place], [phone] Ext. [phone]) concerning possibility of rehab at SNF before returning home.  Employment status:  Retired Forensic scientist:  Medicare PT Recommendations:  [place] / Referral to community resources:  [place]  Patient/Family's Response to care:  Patient's guardian recognizes need for rehab before  returning home and is agreeable to a SNF in [place]. Patient's guardian reported preference for [place], or [facility].  Patient/Family's Understanding of and Emotional Response to Diagnosis, Current Treatment, and Prognosis:  Patient/family is realistic regarding therapy needs and expressed being hopeful for SNF placement. Patient's guardian expressed understanding of CSW role and discharge process. No questions/concerns about plan or treatment.    Emotional Assessment Appearance:  Appears stated age Attitude/Demeanor/Rapport:  Unable to Assess Affect (typically observed):  Unable to Assess Orientation:   (Appropriate to developmental age) Alcohol / Substance use:  Not Applicable Psych involvement (Current and /or in the community):  No (Comment)  Discharge Needs  Concerns to be addressed:  Care Coordination Readmission within the last 30 days:  No Current discharge risk:  None Barriers to Discharge:  Continued Medical Work up   [employer], [place] 11/24/2016, 2:37 PM

## 2016-11-24 NOTE — Progress Notes (Signed)
PROGRESS NOTE        PATIENT DETAILS Name: Zachary [name] Age: 71 y.o. Sex: male Date of Birth: December 09, 1945 Admit Date: 11/20/2016 Admitting Physician Thurnell Lose, MD KXF:GHWEX,HBZJIRC, MD  Brief Narrative: Patient is a 71 y.o. male with h/o ESRD on HD TTS, mild mental retardation, T2DM, HTN, and hypothyroidism. Presented to hospital on 11/20/16 following a mechanical fall. Found to have a right distal femur fracture. Admitted for further evaluation and treatment.   Subjective: Patient is in HD with normal mood and denies any pain at this time.   Assessment/Plan:   Closed comminuted intra-articular fracture of distal femur, right femoral fracture: Due to mechanical fall. Underwent ORIF on 11/22/16. Orthopedics following. Continue warfarin for VTE prophylaxis (target INR 1.7) for 30 days from 11/22/16. INR today is 1.44. Continue non-weight bearing status until cleared by orthopedics.    Acute blood loss anemia compounded with anemia related to ESRD: Possibly due to blood loss in setting of femur fracture. Per op note about 500 mL of blood loss. Hgb today is 7.1. Consulted with nephrology - transfused 2 unit PRBC during HD today. Continue ARANESP, and IV iron. Continue to monitor daily CBC and BMP. Resume TTS dialysis schedule upon discharge.    Uncontrolled T2DM: Hold oral hypoglycemic agents - CBG is stable. Continue novoLOG sliding scale.    Hypertension: Controlled, mildly hypotensive. Discontinued Coreg and BIDIL. Continue BP monitoring.   Dyslipidemia: Continue statin and niacin.   Hypothyroidism: Continue levothyroxine.   BPH: Continue Flomax   GERD: Continue PPI   DVT Prophylaxis: Full dose anticoagulation with Coumadin  Code Status: Full code  Family Communication: None  Disposition Plan: Remain inpatient  Antimicrobial agents: Anti-infectives    Start     Dose/Rate Route Frequency Ordered Stop   11/23/16 0000  ceFAZolin (ANCEF) IVPB  2g/100 mL premix     2 g 200 mL/hr over 30 Minutes Intravenous Every 12 hours 11/22/16 1409 11/23/16 1220   11/22/16 1100  ceFAZolin (ANCEF) IVPB 1 g/50 mL premix     1 g 100 mL/hr over 30 Minutes Intravenous  Once 11/21/16 1910 11/22/16 1147   11/21/16 1600  ceFAZolin (ANCEF) IVPB 1 g/50 mL premix  Status:  Discontinued     1 g 100 mL/hr over 30 Minutes Intravenous  Once 11/21/16 1052 11/21/16 1910   11/21/16 0800  ceFAZolin (ANCEF) IVPB 1 g/50 mL premix  Status:  Discontinued     1 g 100 mL/hr over 30 Minutes Intravenous  Once 11/20/16 1705 11/21/16 1052      Procedures: HD  CONSULTS: Nephrology ESRD Anemia consult  Pharmacology Warfarin consult   Time spent: 25 minutes-Greater than 50% of this time was spent in counseling, explanation of diagnosis, planning of further management, and coordination of care.  MEDICATIONS: Scheduled Meds: . sodium chloride   Intravenous Once  . sodium chloride   Intravenous Once  . atorvastatin  40 mg Oral QPM  . darbepoetin (ARANESP) injection - DIALYSIS  150 mcg Intravenous Q Mon-HD  . doxercalciferol  1 mcg Intravenous Q T,Th,Sa-HD  . ferric gluconate (FERRLECIT/NULECIT) IV  125 mg Intravenous Daily  . insulin aspart  0-5 Units Subcutaneous QHS  . insulin aspart  0-9 Units Subcutaneous TID WC  . levothyroxine  75 mcg Oral QAC breakfast  . multivitamin  1 tablet Oral QHS  . niacin  1,000 mg Oral QHS  . pantoprazole  40 mg Oral Daily  . simvastatin  20 mg Oral QHS  . sodium polystyrene  30 g Oral Q M,W,F  . tamsulosin  0.4 mg Oral Daily  . Warfarin - Pharmacist Dosing Inpatient   Does not apply q1800   Continuous Infusions: PRN Meds:.acetaminophen **OR** acetaminophen, HYDROcodone-acetaminophen, menthol-cetylpyridinium **OR** phenol, metoCLOPramide **OR** metoCLOPramide (REGLAN) injection, metoprolol, morphine injection, ondansetron **OR** ondansetron (ZOFRAN) IV   PHYSICAL EXAM: Vital signs: Vitals:   11/24/16 1015 11/24/16 1020  11/24/16 1030 11/24/16 1040  BP: (!) 93/59 (!) 100/57 (!) 115/58 111/61  Pulse: (!) 109 (!) 108 (!) 106 (!) 108  Resp:    17  Temp: 98.9 F (37.2 C) 98.9 F (37.2 C)  98.8 F (37.1 C)  TempSrc:    Oral  SpO2:      Weight:       Filed Weights   11/21/16 1640 11/24/16 0500 11/24/16 0810  Weight: 85.3 kg (188 lb 0.8 oz) 86.1 kg (189 lb 13.1 oz) 83.3 kg (183 lb 10.3 oz)   Body mass index is 28.76 kg/m.   General appearance: Awake, alert, not in any distress. Speech Clear. Not toxic Looking Eyes: PERRLA, no scleral icterus. Pink conjunctiva HEENT: Atraumatic and Normocephalic Neck: Supple, no JVD. No cervical lymphadenopathy. CVS: S1 S2 regular, no murmurs.  GI: Bowel sounds present, Non tender and not distended with no gaurding, rigidity or rebound.No organomegaly Extremities: B/L Lower Ext shows no edema, both legs are warm to touch.  Neurology:  Speech clear, Non focal, sensation is grossly intact. Patient can wiggle right toes.  Psychiatric: Normal judgment and insight. AAO x 3. Normal mood. Musculoskeletal: No digital cyanosis Skin:No Rash, warm and dry Wounds:N/A  I have personally reviewed following labs and imaging studies  LABORATORY DATA: CBC:  Recent Labs Lab 11/20/16 1446 11/20/16 2303 11/21/16 0556 11/22/16 0812 11/23/16 0742 11/24/16 0339  WBC 8.8 8.0 6.3 9.2 11.9* 12.8*  NEUTROABS 6.4  --   --   --   --   --   HGB 9.0* 8.5* 7.5* 9.7* 7.7* 7.1*  HCT 28.6* 26.2* 23.0* 29.9* 23.1* 21.8*  MCV 110.9* 106.1* 106.0* 101.4* 100.0 99.5  PLT 176 143* 129* 125* 124* 137*    Basic Metabolic Panel:  Recent Labs Lab 11/21/16 0556 11/21/16 1325 11/22/16 0812 11/23/16 0502 11/24/16 0339  NA 139 139 139 136 136  K 5.4* 5.6* 4.4 5.1 5.6*  CL 105 106 100* 101 100*  CO2 24 24 28 25 23   GLUCOSE 91 96 110* 143* 91  BUN 48* 49* 28* 43* 55*  CREATININE 5.50* 5.75* 4.58* 6.26* 7.68*  CALCIUM 8.7* 8.5* 8.9 8.4* 8.3*  PHOS  --  5.0* 4.9* 6.7* 6.9*     GFR: Estimated Creatinine Clearance: 9.5 mL/min (by C-G formula based on SCr of 7.68 mg/dL (H)).  Liver Function Tests:  Recent Labs Lab 11/20/16 1446 11/21/16 1325 11/22/16 0812 11/23/16 0502 11/24/16 0339  AST 27  --   --   --   --   ALT 33  --   --   --   --   ALKPHOS 77  --   --   --   --   BILITOT 0.4  --   --   --   --   PROT 7.1  --   --   --   --   ALBUMIN 3.7 2.8* 2.9* 2.8* 2.5*   No results for input(s): LIPASE, AMYLASE in  the last 168 hours. No results for input(s): AMMONIA in the last 168 hours.  Coagulation Profile:  Recent Labs Lab 11/20/16 1446 11/23/16 0502 11/24/16 0339  INR 1.00 1.28 1.44    Cardiac Enzymes: No results for input(s): CKTOTAL, CKMB, CKMBINDEX, TROPONINI in the last 168 hours.  BNP (last 3 results) No results for input(s): PROBNP in the last 8760 hours.  HbA1C: No results for input(s): HGBA1C in the last 72 hours.  CBG:  Recent Labs Lab 11/23/16 0632 11/23/16 1154 11/23/16 1649 11/23/16 2127 11/24/16 0558  GLUCAP 143* 138* 148* 109* 113*    Lipid Profile: No results for input(s): CHOL, HDL, LDLCALC, TRIG, CHOLHDL, LDLDIRECT in the last 72 hours.  Thyroid Function Tests: No results for input(s): TSH, T4TOTAL, FREET4, T3FREE, THYROIDAB in the last 72 hours.  Anemia Panel:  Recent Labs  11/23/16 0502  TIBC 148*  IRON <5*    Urine analysis:    Component Value Date/Time   COLORURINE COLORLESS (A) 11/23/2016 0841   APPEARANCEUR CLEAR 11/23/2016 0841   LABSPEC 1.000 (L) 11/23/2016 0841   PHURINE 6.0 11/23/2016 0841   GLUCOSEU NEGATIVE 11/23/2016 0841   HGBUR NEGATIVE 11/23/2016 0841   BILIRUBINUR NEGATIVE 11/23/2016 0841   KETONESUR NEGATIVE 11/23/2016 0841   PROTEINUR NEGATIVE 11/23/2016 0841   UROBILINOGEN 0.2 07/20/2015 1850   NITRITE NEGATIVE 11/23/2016 0841   LEUKOCYTESUR NEGATIVE 11/23/2016 0841    Sepsis Labs: Lactic Acid, Venous    Component Value Date/Time   LATICACIDVEN 0.51 03/10/2016  0201    MICROBIOLOGY: Recent Results (from the past 240 hour(s))  Surgical PCR screen     Status: None   Collection Time: 11/21/16 12:05 AM  Result Value Ref Range Status   MRSA, PCR NEGATIVE NEGATIVE Final   Staphylococcus aureus NEGATIVE NEGATIVE Final    Comment:        The Xpert SA Assay (FDA approved for NASAL specimens in patients over 68 years of age), is one component of a comprehensive surveillance program.  Test performance has been validated by [facility] for patients greater than or equal to 33 year old. It is not intended to diagnose infection nor to guide or monitor treatment.     RADIOLOGY STUDIES/RESULTS: Dg Chest 1 View  Result Date: 11/20/2016 CLINICAL DATA:  Femur fracture. Shortness of breath. Preoperative evaluation. EXAM: CHEST 1 VIEW COMPARISON:  03/06/2016 FINDINGS: Heart size is normal. Mediastinal shadows are normal. Right lung is clear. There is mild atelectasis at the left base. No effusion. No acute bone finding. IMPRESSION: Mild atelectasis at the left base. Electronically Signed   By: Nelson Chimes M.D.   On: 11/20/2016 15:25   Dg Knee 2 Views Right  Result Date: 11/20/2016 CLINICAL DATA:  Pain and swelling RIGHT knee EXAM: RIGHT KNEE - 1-2 VIEW COMPARISON:  None FINDINGS: Osseous demineralization. Joint spaces preserved. Comminuted fracture of the distal RIGHT femoral metaphysis with apex anterior angulation and mild posterior displacement. Intra-articular extension into the intercondylar notch. Associated soft tissue swelling and deformity. No additional fracture, dislocation, or bone destruction. IMPRESSION: Comminuted displaced and angulated fracture of the distal RIGHT femoral metaphysis with intra-articular extension into the RIGHT knee. Osseous demineralization. Electronically Signed   By: Lavonia Dana M.D.   On: 11/20/2016 13:41   Ct Knee Right Wo Contrast  Result Date: 11/20/2016 CLINICAL DATA:  Femoral fracture after fall at dialysis. EXAM:  CT OF THE RIGHT KNEE WITHOUT CONTRAST TECHNIQUE: Multidetector CT imaging of the RIGHT knee was performed according to the standard  protocol. Multiplanar CT image reconstructions were also generated. COMPARISON:  Radiographs from 11/20/2016 of the right knee FINDINGS: Bones/Joint/Cartilage Acute, comminuted and closed T-shaped supracondylar fracture of the right femur with sagittal fracture component extending through the intercondylar notch into the femorotibial compartment along the medial aspect of the lateral femoral condyle. The fracture involving the lateral femoral condyle causes the articular surface to be disrupted by 4 mm along the anterior weight-bearing portion of the condyles by 4 mm on the sagittal reformats (image 55,series 6) will you. Fracture also extends into the lateral patellofemoral compartment. Lipohemarthrosis is noted. There is 1/4 shaft with displacement of the distal fracture fragment. A nondisplaced sagittally oriented posterior cortical fracture extends more proximally of the diaphysis of the femoral shaft, incompletely included that extends at least to the junction of the middle and distal third when correlated with same day radiographs. No tibial fracture. Ligaments Suboptimally assessed by CT. Muscles and Tendons Diffuse periarticular intramuscular edema without intramuscular hematoma. Soft tissues Lipohemarthrosis of the knee joint. Periarticular soft tissue edema. IMPRESSION: Acute, closed, comminuted and dorsally displaced T-shaped supracondylar fracture of the right femur extending into the femorotibial and patellofemoral compartments as described with associated lipohemarthrosis. No tibial fracture. There is a nondisplaced posterior diaphyseal fracture of the femoral shaft that extends more proximally at least to the junction of the middle and distal third. The proximal extent is not entirely included on this study of the knee. Electronically Signed   By: Ashley Royalty M.D.   On:  11/20/2016 19:27   Dg C-arm 1-60 Min  Result Date: 11/22/2016 CLINICAL DATA:  ORIF distal right femoral fracture EXAM: DG C-ARM 61-120 MIN; RIGHT KNEE - 3 VIEW COMPARISON:  11/20/2016 FINDINGS: Plate and screw fixation across the distal right femoral fracture. Near anatomic alignment. No hardware complicating feature. IMPRESSION: Internal fixation across the distal right femoral fracture. No visible complicating feature. Electronically Signed   By: Rolm Baptise M.D.   On: 11/22/2016 13:50   Dg Femur Port, Min 2 Views Right  Result Date: 11/22/2016 CLINICAL DATA:  Postop check. EXAM: RIGHT FEMUR PORTABLE 2 VIEW COMPARISON:  11/20/2016 and 11/22/2016 FINDINGS: Examination demonstrates a lateral fixation plate with screws bridging patient's distal femoral fracture with hardware intact and near anatomic alignment over the fracture site. Skin staples are present over the lateral soft tissues. Separate orthopedic screw extending from anterior to posterior over the lateral femoral condyles intact. IMPRESSION: Post fixation of distal femoral fracture with hardware intact and near anatomic alignment about the fracture site. Electronically Signed   By: Marin Olp M.D.   On: 11/22/2016 17:00   Dg Knee 2 Views Right  Result Date: 11/22/2016 CLINICAL DATA:  ORIF distal right femoral fracture EXAM: DG C-ARM 61-120 MIN; RIGHT KNEE - 3 VIEW COMPARISON:  11/20/2016 FINDINGS: Plate and screw fixation across the distal right femoral fracture. Near anatomic alignment. No hardware complicating feature. IMPRESSION: Internal fixation across the distal right femoral fracture. No visible complicating feature. Electronically Signed   By: Rolm Baptise M.D.   On: 11/22/2016 13:50     LOS: 4 days   Rose Clousing, PAS  [employer]  If 7PM-7AM, please contact night-coverage [email or web address] Password [facility] 11/24/2016, 11:11 AM  Attending MD note  Patient was seen, examined,treatment plan was discussed with the PA-S.  I  have personally reviewed the clinical findings, lab, imaging studies and management of this patient in detail. I agree with the documentation, as recorded by the PA-S.   Patient is lying comfortably-seen  earlier at HD unit  On Exam: Gen. exam: Awake, not in any distress Chest: Good air entry bilaterally, no rhonchi or rales CVS: S1-S2 regular, no murmurs Abdomen: Soft, nontender and nondistended Neurology: Non-focal Skin: No rash or lesions  Imp: Right femur fracture Acute blood loss anemia Hyperkalemia ESRD  Plan Transfuse PRBC with HD today Repeat CBC/[place]ytes tomorrow Suspect back to SNF on 1/30  Rest as above  [facility]

## 2016-11-24 NOTE — Progress Notes (Signed)
   Subjective:  Patient reports pain as mild to moderate.  No c/o.  Objective:   VITALS:   Vitals:   11/23/16 1443 11/23/16 1726 11/23/16 1939 11/24/16 0500  BP: (!) 90/50 108/61 (!) 109/51 (!) 109/51  Pulse: 60 (!) 111 62 (!) 113  Resp:    18  Temp: 99.6 F (37.6 C)  (!) 100.9 F (38.3 C) (!) 100.6 F (38.1 C)  TempSrc: Oral  Oral Oral  SpO2: 97% 95% 95% 95%  Weight:    86.1 kg (189 lb 13.1 oz)    NAD ABD soft Sensation intact distally Intact pulses distally Dorsiflexion/Plantar flexion intact Incision: dressing C/D/I Compartment soft   Lab Results  Component Value Date   WBC 12.8 (H) 11/24/2016   HGB 7.1 (L) 11/24/2016   HCT 21.8 (L) 11/24/2016   MCV 99.5 11/24/2016   PLT 137 (L) 11/24/2016   BMET    Component Value Date/Time   NA 136 11/24/2016 0339   K 5.6 (H) 11/24/2016 0339   CL 100 (L) 11/24/2016 0339   CO2 23 11/24/2016 0339   GLUCOSE 91 11/24/2016 0339   BUN 55 (H) 11/24/2016 0339   CREATININE 7.68 (H) 11/24/2016 0339   CALCIUM 8.3 (L) 11/24/2016 0339   CALCIUM 8.3 (L) 03/03/2015 0622   GFRNONAA 6 (L) 11/24/2016 0339   GFRAA 7 (L) 11/24/2016 0339    INR 1.4  Assessment/Plan: 2 Days Post-Op   Principal Problem:   Right femoral fracture (HCC) Active Problems:   DM (diabetes mellitus), type 2, uncontrolled (HCC)   Mental retardation   Benign prostatic hyperplasia with urinary retention   Hyperlipidemia   End stage renal disease (HCC)   Closed comminuted intra-articular fracture of distal femur, right, initial encounter ([place])   NWB RLE DVT ppx: coumadin for 30 days (target INR 1.7), SCDs, TEDs PO pain control PT/OT D/C planning   Bakary Bramer, Horald Pollen 11/24/2016, 7:24 AM   Rod Can, MD Cell [phone]

## 2016-11-25 ENCOUNTER — Encounter (HOSPITAL_COMMUNITY): Payer: Self-pay | Admitting: Orthopedic Surgery

## 2016-11-25 DIAGNOSIS — D62 Acute posthemorrhagic anemia: Secondary | ICD-10-CM

## 2016-11-25 LAB — RENAL FUNCTION PANEL
Albumin: 2.6 g/dL — ABNORMAL LOW (ref 3.5–5.0)
Anion gap: 14 (ref 5–15)
BUN: 41 mg/dL — ABNORMAL HIGH (ref 6–20)
CO2: 25 mmol/L (ref 22–32)
Calcium: 8.7 mg/dL — ABNORMAL LOW (ref 8.9–10.3)
Chloride: 97 mmol/L — ABNORMAL LOW (ref 101–111)
Creatinine, Ser: 6.47 mg/dL — ABNORMAL HIGH (ref 0.61–1.24)
GFR calc Af Amer: 9 mL/min — ABNORMAL LOW (ref >60)
GFR calc non Af Amer: 8 mL/min — ABNORMAL LOW (ref >60)
Glucose, Bld: 112 mg/dL — ABNORMAL HIGH (ref 65–99)
Phosphorus: 6.7 mg/dL — ABNORMAL HIGH (ref 2.5–4.6)
Potassium: 4.8 mmol/L (ref 3.5–5.1)
Sodium: 136 mmol/L (ref 135–145)

## 2016-11-25 LAB — CBC
HCT: 30.5 % — ABNORMAL LOW (ref 39.0–52.0)
Hemoglobin: 10.1 g/dL — ABNORMAL LOW (ref 13.0–17.0)
MCH: 31.7 pg (ref 26.0–34.0)
MCHC: 33.1 g/dL (ref 30.0–36.0)
MCV: 95.6 fL (ref 78.0–100.0)
Platelets: 148 10*3/uL — ABNORMAL LOW (ref 150–400)
RBC: 3.19 MIL/uL — ABNORMAL LOW (ref 4.22–5.81)
RDW: 19.5 % — ABNORMAL HIGH (ref 11.5–15.5)
WBC: 13.4 10*3/uL — ABNORMAL HIGH (ref 4.0–10.5)

## 2016-11-25 LAB — TYPE AND SCREEN
Blood Product Expiration Date: 201802262359
Blood Product Expiration Date: 201802262359
ISSUE DATE / TIME: 201801290914
ISSUE DATE / TIME: 201801290914
Unit Type and Rh: 7300
Unit Type and Rh: 7300

## 2016-11-25 LAB — PROTIME-INR
INR: 1.55
Prothrombin Time: 18.8 seconds — ABNORMAL HIGH (ref 11.4–15.2)

## 2016-11-25 LAB — GLUCOSE, CAPILLARY
Glucose-Capillary: 115 mg/dL — ABNORMAL HIGH (ref 65–99)
Glucose-Capillary: 114 mg/dL — ABNORMAL HIGH (ref 65–99)
Glucose-Capillary: 110 mg/dL — ABNORMAL HIGH (ref 65–99)
Glucose-Capillary: 107 mg/dL — ABNORMAL HIGH (ref 65–99)

## 2016-11-25 MED ORDER — WARFARIN SODIUM 3 MG PO TABS
3.0000 mg | ORAL_TABLET | Freq: Once | ORAL | Status: AC
  Administered 2016-11-25: 3 mg via ORAL
  Filled 2016-11-25: qty 1

## 2016-11-25 MED ORDER — HYDROCODONE-ACETAMINOPHEN 5-325 MG PO TABS: 1.0000 | ORAL_TABLET | Freq: Four times a day (QID) | ORAL | 0 refills | Status: DC | PRN

## 2016-11-25 NOTE — Progress Notes (Signed)
Physical Therapy Treatment Patient Details Name: Zachary [name] MRN: [id number] DOB: [birth year] Today's Date: 11/25/2016    History of Present Illness Pt is a 71 y/o male s/p ORIF of distal femur fracture as a result of a mechanical fall. Pt has a past medical history including DM, MR, HLD, HTN, and ARF.    PT Comments    Patient limited by pain in R LE and unable to achieve standing this session. Continue to progress as tolerated with anticipated d/c to SNF for further skilled PT services.    Follow Up Recommendations  SNF     Equipment Recommendations  None recommended by PT (defer to next venue)    Recommendations for Other Services       Precautions / Restrictions Precautions Precautions: Fall Precaution Comments: fall resulted in current hospitalization Restrictions Weight Bearing Restrictions: Yes RLE Weight Bearing: Non weight bearing    Mobility  Bed Mobility Overal bed mobility: Needs Assistance Bed Mobility: Supine to Sit;Sit to Supine     Supine to sit: Max assist;+2 for physical assistance;HOB elevated Sit to supine: +2 for physical assistance;Max assist   General bed mobility comments: assist at bilat LE, hips, and trunk; cues for sequencing and technique; use of rails and bed pad and HOB elevated when coming into sitting; assist to bring R LE into bed and to position once lying down; pt able to bridge X2 and assist in scooting toward [facility]  Transfers                 General transfer comment: attempted sit to stands X2 but unable to achieve standing  Ambulation/Gait                 Stairs            Wheelchair Mobility    Modified Rankin (Stroke Patients Only)       Balance   Sitting-balance support: Single extremity supported;Feet supported Sitting balance-Leahy Scale: Poor Sitting balance - Comments: L lateral lean--avoiding painful R hip Postural control: Left lateral lean                          Cognition  Arousal/Alertness: Awake/alert Behavior During Therapy: WFL for tasks assessed/performed Overall Cognitive Status: History of cognitive impairments - at baseline                      Exercises      General Comments General comments (skin integrity, edema, etc.): pt resisted attempts to stand this session      Pertinent Vitals/Pain Pain Assessment: Faces Faces Pain Scale: Hurts even more Pain Location: RLE Pain Descriptors / Indicators: Grimacing;Guarding;Moaning;Sore Pain Intervention(s): Limited activity within patient's tolerance;Monitored during session;Premedicated before session;Repositioned    Home Living                      Prior Function            PT Goals (current goals can now be found in the care plan section) Acute Rehab PT Goals Patient Stated Goal: none stated Progress towards PT goals: Not progressing toward goals - comment    Frequency    Min 3X/week      PT Plan Current plan remains appropriate    Co-evaluation             End of Session Equipment Utilized During Treatment: Gait belt Activity Tolerance: Patient limited by pain Patient left: in  bed;with call bell/phone within reach;with nursing/sitter in room     Time: 1103-1120 PT Time Calculation (min) (ACUTE ONLY): 17 min  Charges:  $Therapeutic Activity: 8-22 mins                    G Codes:      Salina April, PTA Pager: [phone]   11/25/2016, 1:28 PM

## 2016-11-25 NOTE — Progress Notes (Signed)
PROGRESS NOTE        PATIENT DETAILS Name: Zachary [name] Age: 71 y.o. Sex: Male Date of Birth: 11-13-1945 Admit Date: 11/20/2016 Admitting Physician Thurnell Lose, MD QJF:HLKTG,[id number], MD  Brief Narrative: Patient is a 71 y.o. male with PMH significant for ESRD on HD TTS, mild mental delays, T2DM, HTN, and hypothyroidism. Presented to hospital on 11/20/16 post mechanical fall. Workup found right distal femur fracture. Admitted for further management. See details below.   Subjective: Patient appears in normal mood this morning with pain controlled.   Assessment/Plan:  Comminuted intra-articular fracture of distal femur, Right femoral fracture: Due to mechanical fall. Underwent ORIF on 11/22/16. Pain is well controlled today. Orthopedics following- remain NWB RLE with coumadin X30 days with target INR of 1.7. Today INR 1.55, continue coumadin, per pharmacy. Continue SCD and TED as well. Continue pain management.     Acute blood loss anemia compounded with anemia related to ESRD: Possibly due to blood loss in setting of femur fracture, per ortho op note about 500 mL blood loss. Received 2 units PRBC on 11/24/16. Yesterday Hgb was 7.1, today Hgb 10.1. Continue ARANESP injection and ferric gluconate. Continue to follow daily CBC and BMP. Resume TTS dialysis schedule upon discharge.    Acute Leukocytosis: WBC elevated from 12.8 yesterday to 13.4. At 2101 today he spiked a fever at 100.2, temp was down to 99.5 by 0401. Clinically patient is not showing any s/s of infection this morning. Continue to monitor with CBC tomorrow AM.   Uncontrolled T2DM: Hold oral hypoglycemic agents - CBG is stable. Continue  novoLOG sliding scale.   Hypothyroidism: Continue synthroid.    Hypertension: Controlled without medication, continue BP monitoring.   Dyslipidemia: Continue statin and niacin.   BPH: Continue tamsulosin.    GERD: Continue PPI   DVT Prophylaxis: SCD's and  TED Full dose anticoagulation Coumadin  Code Status: Full code   Family Communication: None  Disposition Plan: Remain inpatient-but will plan on SNF on discharge  Antimicrobial agents: Anti-infectives    Start     Dose/Rate Route Frequency Ordered Stop   11/23/16 0000  ceFAZolin (ANCEF) IVPB 2g/100 mL premix     2 g 200 mL/hr over 30 Minutes Intravenous Every 12 hours 11/22/16 1409 11/23/16 1220   11/22/16 1100  ceFAZolin (ANCEF) IVPB 1 g/50 mL premix     1 g 100 mL/hr over 30 Minutes Intravenous  Once 11/21/16 1910 11/22/16 1147   11/21/16 1600  ceFAZolin (ANCEF) IVPB 1 g/50 mL premix  Status:  Discontinued     1 g 100 mL/hr over 30 Minutes Intravenous  Once 11/21/16 1052 11/21/16 1910   11/21/16 0800  ceFAZolin (ANCEF) IVPB 1 g/50 mL premix  Status:  Discontinued     1 g 100 mL/hr over 30 Minutes Intravenous  Once 11/20/16 1705 11/21/16 1052      Procedures: HD  CONSULTS: Nephrology ESRD Anemia and Leukocytosis consult  Pharmacology INR consult   Time spent: 25 minutes-Greater than 50% of this time was spent in counseling, explanation of diagnosis, planning of further management, and coordination of care.  MEDICATIONS: Scheduled Meds: . sodium chloride   Intravenous Once  . sodium chloride   Intravenous Once  . atorvastatin  40 mg Oral QPM  . darbepoetin (ARANESP) injection - DIALYSIS  150 mcg Intravenous Q Mon-HD  . doxercalciferol  1 mcg Intravenous Q T,Th,Sa-HD  . ferric gluconate (FERRLECIT/NULECIT) IV  125 mg Intravenous Daily  . insulin aspart  0-5 Units Subcutaneous QHS  . insulin aspart  0-9 Units Subcutaneous TID WC  . levothyroxine  75 mcg Oral QAC breakfast  . multivitamin  1 tablet Oral QHS  . niacin  1,000 mg Oral QHS  . pantoprazole  40 mg Oral Daily  . simvastatin  20 mg Oral QHS  . sodium polystyrene  30 g Oral Q M,W,F  . tamsulosin  0.4 mg Oral Daily  . warfarin  3 mg Oral ONCE-1800  . Warfarin - Pharmacist Dosing Inpatient   Does not apply  q1800   Continuous Infusions: PRN Meds:.acetaminophen **OR** acetaminophen, HYDROcodone-acetaminophen, menthol-cetylpyridinium **OR** phenol, metoCLOPramide **OR** metoCLOPramide (REGLAN) injection, metoprolol, morphine injection, ondansetron **OR** ondansetron (ZOFRAN) IV   PHYSICAL EXAM: Vital signs: Vitals:   11/24/16 1225 11/24/16 2153 11/25/16 0455 11/25/16 0500  BP: (!) 105/59 (!) 106/59 (!) 108/50   Pulse: (!) 103 (!) 115 (!) 118   Resp: 16 16 16    Temp: 98.6 F (37 C) 100.2 F (37.9 C) 99.5 F (37.5 C)   TempSrc: Oral Oral Oral   SpO2: 95% 95% 96%   Weight: 80.4 kg (177 lb 4 oz)   82.5 kg (181 lb 14.1 oz)   Filed Weights   11/24/16 0810 11/24/16 1225 11/25/16 0500  Weight: 83.3 kg (183 lb 10.3 oz) 80.4 kg (177 lb 4 oz) 82.5 kg (181 lb 14.1 oz)   Body mass index is 28.49 kg/m.   General appearance: Awake, alert, not in any distress. Speech Clear. Not toxic Looking Eyes: PERRLA,no scleral icterus.Pink conjunctiva HEENT: Atraumatic and Normocephalic Neck: Supple, no JVD. No cervical lymphadenopathy. Resp:Good air entry bilaterally, no added sounds  CVS: S1 S2 regular, no murmurs.  GI: Bowel sounds present, Non tender and not distended with no gaurding, rigidity or rebound. No organomegaly Extremities: B/L Lower Ext shows no edema, both legs are warm to touch Neurology:  Speech clear, Non focal, sensation is grossly intact. Pt. can wiggle right toes.  Psychiatric: Normal judgment and insight. AAO x 3. Normal mood. Musculoskeletal:No digital cyanosis Skin:No Rash, warm and dry  I have personally reviewed following labs and imaging studies  LABORATORY DATA: CBC:  Recent Labs Lab 11/20/16 1446  11/21/16 0556 11/22/16 0812 11/23/16 0742 11/24/16 0339 11/25/16 0428  WBC 8.8  < > 6.3 9.2 11.9* 12.8* 13.4*  NEUTROABS 6.4  --   --   --   --   --   --   HGB 9.0*  < > 7.5* 9.7* 7.7* 7.1* 10.1*  HCT 28.6*  < > 23.0* 29.9* 23.1* 21.8* 30.5*  MCV 110.9*  < > 106.0*  101.4* 100.0 99.5 95.6  PLT 176  < > 129* 125* 124* 137* 148*  < > = values in this interval not displayed.  Basic Metabolic Panel:  Recent Labs Lab 11/21/16 1325 11/22/16 0812 11/23/16 0502 11/24/16 0339 11/25/16 0428  NA 139 139 136 136 136  K 5.6* 4.4 5.1 5.6* 4.8  CL 106 100* 101 100* 97*  CO2 24 28 25 23 25   GLUCOSE 96 110* 143* 91 112*  BUN 49* 28* 43* 55* 41*  CREATININE 5.75* 4.58* 6.26* 7.68* 6.47*  CALCIUM 8.5* 8.9 8.4* 8.3* 8.7*  PHOS 5.0* 4.9* 6.7* 6.9* 6.7*    GFR: Estimated Creatinine Clearance: 11.2 mL/min (by C-G formula based on SCr of 6.47 mg/dL (H)).  Liver Function Tests:  Recent Labs  Lab 11/20/16 1446 11/21/16 1325 11/22/16 2130 11/23/16 0502 11/24/16 0339 11/25/16 0428  AST 27  --   --   --   --   --   ALT 33  --   --   --   --   --   ALKPHOS 77  --   --   --   --   --   BILITOT 0.4  --   --   --   --   --   PROT 7.1  --   --   --   --   --   ALBUMIN 3.7 2.8* 2.9* 2.8* 2.5* 2.6*   No results for input(s): LIPASE, AMYLASE in the last 168 hours. No results for input(s): AMMONIA in the last 168 hours.  Coagulation Profile:  Recent Labs Lab 11/20/16 1446 11/23/16 0502 11/24/16 0339 11/25/16 0428  INR 1.00 1.28 1.44 1.55    Cardiac Enzymes: No results for input(s): CKTOTAL, CKMB, CKMBINDEX, TROPONINI in the last 168 hours.  BNP (last 3 results) No results for input(s): PROBNP in the last 8760 hours.  HbA1C: No results for input(s): HGBA1C in the last 72 hours.  CBG:  Recent Labs Lab 11/24/16 0558 11/24/16 1305 11/24/16 1753 11/24/16 2157 11/25/16 0827  GLUCAP 113* 89 128* 122* 115*    Lipid Profile: No results for input(s): CHOL, HDL, LDLCALC, TRIG, CHOLHDL, LDLDIRECT in the last 72 hours.  Thyroid Function Tests: No results for input(s): TSH, T4TOTAL, FREET4, T3FREE, THYROIDAB in the last 72 hours.  Anemia Panel:  Recent Labs  11/23/16 0502  TIBC 148*  IRON <5*    Urine analysis:    Component Value  Date/Time   COLORURINE COLORLESS (A) 11/23/2016 0841   APPEARANCEUR CLEAR 11/23/2016 0841   LABSPEC 1.000 (L) 11/23/2016 0841   PHURINE 6.0 11/23/2016 0841   GLUCOSEU NEGATIVE 11/23/2016 0841   HGBUR NEGATIVE 11/23/2016 0841   BILIRUBINUR NEGATIVE 11/23/2016 0841   KETONESUR NEGATIVE 11/23/2016 0841   PROTEINUR NEGATIVE 11/23/2016 0841   UROBILINOGEN 0.2 07/20/2015 1850   NITRITE NEGATIVE 11/23/2016 0841   LEUKOCYTESUR NEGATIVE 11/23/2016 0841    Sepsis Labs: Lactic Acid, Venous    Component Value Date/Time   LATICACIDVEN 0.51 03/10/2016 0201    MICROBIOLOGY: Recent Results (from the past 240 hour(s))  Surgical PCR screen     Status: None   Collection Time: 11/21/16 12:05 AM  Result Value Ref Range Status   MRSA, PCR NEGATIVE NEGATIVE Final   Staphylococcus aureus NEGATIVE NEGATIVE Final    Comment:        The Xpert SA Assay (FDA approved for NASAL specimens in patients over 24 years of age), is one component of a comprehensive surveillance program.  Test performance has been validated by [facility] for patients greater than or equal to 36 year old. It is not intended to diagnose infection nor to guide or monitor treatment.     RADIOLOGY STUDIES/RESULTS: Dg Chest 1 View  Result Date: 11/20/2016 CLINICAL DATA:  Femur fracture. Shortness of breath. Preoperative evaluation. EXAM: CHEST 1 VIEW COMPARISON:  03/06/2016 FINDINGS: Heart size is normal. Mediastinal shadows are normal. Right lung is clear. There is mild atelectasis at the left base. No effusion. No acute bone finding. IMPRESSION: Mild atelectasis at the left base. Electronically Signed   By: Nelson Chimes M.D.   On: 11/20/2016 15:25   Dg Knee 2 Views Right  Result Date: 11/20/2016 CLINICAL DATA:  Pain and swelling RIGHT knee EXAM: RIGHT KNEE - 1-2  VIEW COMPARISON:  None FINDINGS: Osseous demineralization. Joint spaces preserved. Comminuted fracture of the distal RIGHT femoral metaphysis with apex anterior  angulation and mild posterior displacement. Intra-articular extension into the intercondylar notch. Associated soft tissue swelling and deformity. No additional fracture, dislocation, or bone destruction. IMPRESSION: Comminuted displaced and angulated fracture of the distal RIGHT femoral metaphysis with intra-articular extension into the RIGHT knee. Osseous demineralization. Electronically Signed   By: Lavonia Dana M.D.   On: 11/20/2016 13:41   Ct Knee Right Wo Contrast  Result Date: 11/20/2016 CLINICAL DATA:  Femoral fracture after fall at dialysis. EXAM: CT OF THE RIGHT KNEE WITHOUT CONTRAST TECHNIQUE: Multidetector CT imaging of the RIGHT knee was performed according to the standard protocol. Multiplanar CT image reconstructions were also generated. COMPARISON:  Radiographs from 11/20/2016 of the right knee FINDINGS: Bones/Joint/Cartilage Acute, comminuted and closed T-shaped supracondylar fracture of the right femur with sagittal fracture component extending through the intercondylar notch into the femorotibial compartment along the medial aspect of the lateral femoral condyle. The fracture involving the lateral femoral condyle causes the articular surface to be disrupted by 4 mm along the anterior weight-bearing portion of the condyles by 4 mm on the sagittal reformats (image 55,series 6) will you. Fracture also extends into the lateral patellofemoral compartment. Lipohemarthrosis is noted. There is 1/4 shaft with displacement of the distal fracture fragment. A nondisplaced sagittally oriented posterior cortical fracture extends more proximally of the diaphysis of the femoral shaft, incompletely included that extends at least to the junction of the middle and distal third when correlated with same day radiographs. No tibial fracture. Ligaments Suboptimally assessed by CT. Muscles and Tendons Diffuse periarticular intramuscular edema without intramuscular hematoma. Soft tissues Lipohemarthrosis of the knee  joint. Periarticular soft tissue edema. IMPRESSION: Acute, closed, comminuted and dorsally displaced T-shaped supracondylar fracture of the right femur extending into the femorotibial and patellofemoral compartments as described with associated lipohemarthrosis. No tibial fracture. There is a nondisplaced posterior diaphyseal fracture of the femoral shaft that extends more proximally at least to the junction of the middle and distal third. The proximal extent is not entirely included on this study of the knee. Electronically Signed   By: Ashley Royalty M.D.   On: 11/20/2016 19:27   Dg C-arm 1-60 Min  Result Date: 11/22/2016 CLINICAL DATA:  ORIF distal right femoral fracture EXAM: DG C-ARM 61-120 MIN; RIGHT KNEE - 3 VIEW COMPARISON:  11/20/2016 FINDINGS: Plate and screw fixation across the distal right femoral fracture. Near anatomic alignment. No hardware complicating feature. IMPRESSION: Internal fixation across the distal right femoral fracture. No visible complicating feature. Electronically Signed   By: Rolm Baptise M.D.   On: 11/22/2016 13:50   Dg Femur Port, Min 2 Views Right  Result Date: 11/22/2016 CLINICAL DATA:  Postop check. EXAM: RIGHT FEMUR PORTABLE 2 VIEW COMPARISON:  11/20/2016 and 11/22/2016 FINDINGS: Examination demonstrates a lateral fixation plate with screws bridging patient's distal femoral fracture with hardware intact and near anatomic alignment over the fracture site. Skin staples are present over the lateral soft tissues. Separate orthopedic screw extending from anterior to posterior over the lateral femoral condyles intact. IMPRESSION: Post fixation of distal femoral fracture with hardware intact and near anatomic alignment about the fracture site. Electronically Signed   By: Marin Olp M.D.   On: 11/22/2016 17:00   Dg Knee 2 Views Right  Result Date: 11/22/2016 CLINICAL DATA:  ORIF distal right femoral fracture EXAM: DG C-ARM 61-120 MIN; RIGHT KNEE - 3 VIEW COMPARISON:  11/20/2016 FINDINGS: Plate and screw fixation across the distal right femoral fracture. Near anatomic alignment. No hardware complicating feature. IMPRESSION: Internal fixation across the distal right femoral fracture. No visible complicating feature. Electronically Signed   By: Rolm Baptise M.D.   On: 11/22/2016 13:50     LOS: 5 days   Joycelin Radloff, PAS  [employer]   If 7PM-7AM, please contact night-coverage [email or web address] Password [email or web address] 11/25/2016, 9:20 AM

## 2016-11-25 NOTE — Progress Notes (Signed)
ANTICOAGULATION CONSULT NOTE - Follow Up Consult  Pharmacy Consult:  Coumadin Indication:  VTE prophylaxis  No Known Allergies  Patient Measurements: Weight: 181 lb 14.1 oz (82.5 kg)  Vital Signs: Temp: 99.5 F (37.5 C) (01/30 0455) Temp Source: Oral (01/30 0455) BP: 108/50 (01/30 0455) Pulse Rate: 118 (01/30 0455)  Labs:  Recent Labs  11/23/16 0502  11/23/16 0742 11/24/16 0339 11/25/16 0428  HGB  --   < > 7.7* 7.1* 10.1*  HCT  --   --  23.1* 21.8* 30.5*  PLT  --   --  124* 137* 148*  LABPROT 16.1*  --   --  17.6* 18.8*  INR 1.28  --   --  1.44 1.55  CREATININE 6.26*  --   --  7.68* 6.47*  < > = values in this interval not displayed.  Estimated Creatinine Clearance: 11.2 mL/min (by C-G formula based on SCr of 6.47 mg/dL (H)).     Assessment: Zachary [name] presented with femur fracture from fall after dialysis session. Now s/p ORIF, and Pharmacy consulted to manage Coumadin for VTE prophylaxis x 30 days from 11/22/16 - target INR 1.7 per Ortho note. INR up to 1.55. Hg up 10.1 s/p 2u PRBC with HD on 1/29. Plt trend up to 148. No bleed documented.   Goal of Therapy:  INR goal 1.7 per MD, aim for 1.5-2  Monitor platelets by anticoagulation protocol:  yes   Plan:  - Coumadin 3mg  PO today - Daily PT / INR - Monitor CBC, s/sx bleeding   Elicia Lamp, PharmD, BCPS Clinical Pharmacist 11/25/2016 9:01 AM

## 2016-11-25 NOTE — Progress Notes (Signed)
Subjective: Interval History: has no complaint, but leg still painful,no appetite.  Objective: Vital signs in last 24 hours: Temp:  [98.6 F (37 C)-100.2 F (37.9 C)] 99.5 F (37.5 C) (01/30 0455) Pulse Rate:  [103-118] 118 (01/30 0455) Resp:  [13-17] 16 (01/30 0455) BP: (87-115)/(46-65) 108/50 (01/30 0455) SpO2:  [95 %-99 %] 96 % (01/30 0455) Weight:  [80.4 kg (177 lb 4 oz)-82.5 kg (181 lb 14.1 oz)] 82.5 kg (181 lb 14.1 oz) (01/30 0500) Weight change: -2.8 kg (-6 lb 2.8 oz)  Intake/Output from previous day: 01/29 0701 - 01/30 0700 In: 1070 [P.O.:400; Blood:670] Out: 2920  Intake/Output this shift: No intake/output data recorded.  General appearance: alert, cooperative and no distress Resp: diminished breath sounds bilaterally Cardio: S1, S2 normal and systolic murmur: systolic ejection 2/6, decrescendo at 2nd left intercostal space GI: pos bs, soft, nontender, mild distension Extremities: AVF RUA, ACE around R leg,leg swollen, foot warm  Lab Results:  Recent Labs  11/24/16 0339 11/25/16 0428  WBC 12.8* 13.4*  HGB 7.1* 10.1*  HCT 21.8* 30.5*  PLT 137* 148*   BMET:  Recent Labs  11/24/16 0339 11/25/16 0428  NA 136 136  K 5.6* 4.8  CL 100* 97*  CO2 23 25  GLUCOSE 91 112*  BUN 55* 41*  CREATININE 7.68* 6.47*  CALCIUM 8.3* 8.7*   No results for input(s): PTH in the last 72 hours. Iron Studies:  Recent Labs  11/23/16 0502  IRON <5*  TIBC 148*    Studies/Results: No results found.  I have reviewed the patient's current medications.  Assessment/Plan: 1 ESRD will do HD in am 2 Fever, ^ WBC  ? Blood in leg, transfusion vs other 3 Anemia TX, esa,  4 DM controlled 5 BP still low keep off meds P HD, esa, follow temp , WBC    LOS: 5 days   Roben Schliep L 11/25/2016,8:43 AM

## 2016-11-25 NOTE — Progress Notes (Signed)
   Subjective:  Patient reports pain as mild to moderate.  No c/o.  Objective:   VITALS:   Vitals:   11/24/16 1225 11/24/16 2153 11/25/16 0455 11/25/16 0500  BP: (!) 105/59 (!) 106/59 (!) 108/50   Pulse: (!) 103 (!) 115 (!) 118   Resp: 16 16 16    Temp: 98.6 F (37 C) 100.2 F (37.9 C) 99.5 F (37.5 C)   TempSrc: Oral Oral Oral   SpO2: 95% 95% 96%   Weight: 80.4 kg (177 lb 4 oz)   82.5 kg (181 lb 14.1 oz)    NAD ABD soft Sensation intact distally Intact pulses distally Dorsiflexion/Plantar flexion intact Incision: dressing C/D/I Compartment soft   Lab Results  Component Value Date   WBC 13.4 (H) 11/25/2016   HGB 10.1 (L) 11/25/2016   HCT 30.5 (L) 11/25/2016   MCV 95.6 11/25/2016   PLT 148 (L) 11/25/2016   BMET    Component Value Date/Time   NA 136 11/25/2016 0428   K 4.8 11/25/2016 0428   CL 97 (L) 11/25/2016 0428   CO2 25 11/25/2016 0428   GLUCOSE 112 (H) 11/25/2016 0428   BUN 41 (H) 11/25/2016 0428   CREATININE 6.47 (H) 11/25/2016 0428   CALCIUM 8.7 (L) 11/25/2016 0428   CALCIUM 8.3 (L) 03/03/2015 0622   GFRNONAA 8 (L) 11/25/2016 0428   GFRAA 9 (L) 11/25/2016 0428    INR 1.55  Assessment/Plan: 3 Days Post-Op   Principal Problem:   Right femoral fracture (HCC) Active Problems:   DM (diabetes mellitus), type 2, uncontrolled ([place])   Mental retardation   Benign prostatic hyperplasia with urinary retention   Hyperlipidemia   End stage renal disease (HCC)   Closed comminuted intra-articular fracture of distal femur, right, initial encounter ([place])   NWB RLE DVT ppx: coumadin for 30 days (target INR 1.7), SCDs, TEDs PO pain control PT/OT D/C planning   Anabella Capshaw, Horald Pollen 11/25/2016, 7:47 AM   Rod Can, MD Cell [phone]

## 2016-11-25 NOTE — Care Management Important Message (Signed)
Important Message  Patient Details  Name: Zachary [name] MRN: [id number] Date of Birth: Aug 07, 1946   Medicare Important Message Given:  Yes    Kathrynne Kulinski Montine Circle 11/25/2016, 12:04 PM

## 2016-11-25 NOTE — Clinical Social Work Note (Addendum)
Per CSW coverage on 1/30 pt's legal guardian has called and spoken to [facility] and [facility] has made a bed offer for pt at this time. CSW called [facility] to confirm. Per [facility] pt has a bed available and facility will adjust pt's dialysis to be completed at [facility] confirmed they will complete the switch. Facility confirms they have spoken to legal guardian and she is up to speed and agreeable. CSW will continue to follow and set up transportation once pt is medically ready for d/c.  [address], [place]

## 2016-11-26 ENCOUNTER — Inpatient Hospital Stay
Admission: RE | Admit: 2016-11-26 | Discharge: 2016-12-26 | Disposition: A | Discharge status: Nursing Home (Includes ICF) | Payer: Medicare Other | Source: Ambulatory Visit | Attending: Internal Medicine | Admitting: Internal Medicine

## 2016-11-26 DIAGNOSIS — N3289 Other specified disorders of bladder: Secondary | ICD-10-CM | POA: Diagnosis not present

## 2016-11-26 DIAGNOSIS — R1110 Vomiting, unspecified: Secondary | ICD-10-CM | POA: Diagnosis not present

## 2016-11-26 DIAGNOSIS — Z992 Dependence on renal dialysis: Secondary | ICD-10-CM | POA: Diagnosis not present

## 2016-11-26 DIAGNOSIS — E1122 Type 2 diabetes mellitus with diabetic chronic kidney disease: Secondary | ICD-10-CM | POA: Diagnosis not present

## 2016-11-26 DIAGNOSIS — E1129 Type 2 diabetes mellitus with other diabetic kidney complication: Secondary | ICD-10-CM | POA: Diagnosis not present

## 2016-11-26 DIAGNOSIS — E11649 Type 2 diabetes mellitus with hypoglycemia without coma: Secondary | ICD-10-CM | POA: Diagnosis not present

## 2016-11-26 DIAGNOSIS — E1121 Type 2 diabetes mellitus with diabetic nephropathy: Secondary | ICD-10-CM | POA: Diagnosis not present

## 2016-11-26 DIAGNOSIS — F70 Mild intellectual disabilities: Secondary | ICD-10-CM | POA: Diagnosis not present

## 2016-11-26 DIAGNOSIS — Z5181 Encounter for therapeutic drug level monitoring: Secondary | ICD-10-CM | POA: Diagnosis not present

## 2016-11-26 DIAGNOSIS — R195 Other fecal abnormalities: Secondary | ICD-10-CM | POA: Diagnosis not present

## 2016-11-26 DIAGNOSIS — R509 Fever, unspecified: Secondary | ICD-10-CM | POA: Diagnosis not present

## 2016-11-26 DIAGNOSIS — R262 Difficulty in walking, not elsewhere classified: Secondary | ICD-10-CM | POA: Diagnosis not present

## 2016-11-26 DIAGNOSIS — N2581 Secondary hyperparathyroidism of renal origin: Secondary | ICD-10-CM | POA: Diagnosis not present

## 2016-11-26 DIAGNOSIS — M79661 Pain in right lower leg: Principal | ICD-10-CM

## 2016-11-26 DIAGNOSIS — M7989 Other specified soft tissue disorders: Principal | ICD-10-CM

## 2016-11-26 DIAGNOSIS — I120 Hypertensive chronic kidney disease with stage 5 chronic kidney disease or end stage renal disease: Secondary | ICD-10-CM | POA: Diagnosis not present

## 2016-11-26 DIAGNOSIS — D509 Iron deficiency anemia, unspecified: Secondary | ICD-10-CM | POA: Diagnosis not present

## 2016-11-26 DIAGNOSIS — S72421D Displaced fracture of lateral condyle of right femur, subsequent encounter for closed fracture with routine healing: Secondary | ICD-10-CM | POA: Diagnosis not present

## 2016-11-26 DIAGNOSIS — E782 Mixed hyperlipidemia: Secondary | ICD-10-CM | POA: Diagnosis not present

## 2016-11-26 DIAGNOSIS — D631 Anemia in chronic kidney disease: Secondary | ICD-10-CM | POA: Diagnosis not present

## 2016-11-26 DIAGNOSIS — I10 Essential (primary) hypertension: Secondary | ICD-10-CM | POA: Diagnosis not present

## 2016-11-26 DIAGNOSIS — R600 Localized edema: Secondary | ICD-10-CM | POA: Diagnosis not present

## 2016-11-26 DIAGNOSIS — S72491A Other fracture of lower end of right femur, initial encounter for closed fracture: Secondary | ICD-10-CM | POA: Diagnosis not present

## 2016-11-26 DIAGNOSIS — E162 Hypoglycemia, unspecified: Secondary | ICD-10-CM | POA: Diagnosis not present

## 2016-11-26 DIAGNOSIS — K219 Gastro-esophageal reflux disease without esophagitis: Secondary | ICD-10-CM | POA: Diagnosis not present

## 2016-11-26 DIAGNOSIS — Z9181 History of falling: Secondary | ICD-10-CM | POA: Diagnosis not present

## 2016-11-26 DIAGNOSIS — R8290 Unspecified abnormal findings in urine: Secondary | ICD-10-CM | POA: Diagnosis not present

## 2016-11-26 DIAGNOSIS — M21061 Valgus deformity, not elsewhere classified, right knee: Secondary | ICD-10-CM | POA: Diagnosis not present

## 2016-11-26 DIAGNOSIS — I82401 Acute embolism and thrombosis of unspecified deep veins of right lower extremity: Secondary | ICD-10-CM | POA: Diagnosis not present

## 2016-11-26 DIAGNOSIS — X58XXXD Exposure to other specified factors, subsequent encounter: Secondary | ICD-10-CM | POA: Diagnosis not present

## 2016-11-26 DIAGNOSIS — M6281 Muscle weakness (generalized): Secondary | ICD-10-CM | POA: Diagnosis not present

## 2016-11-26 DIAGNOSIS — S72001A Fracture of unspecified part of neck of right femur, initial encounter for closed fracture: Secondary | ICD-10-CM | POA: Diagnosis not present

## 2016-11-26 DIAGNOSIS — D638 Anemia in other chronic diseases classified elsewhere: Secondary | ICD-10-CM | POA: Diagnosis not present

## 2016-11-26 DIAGNOSIS — N390 Urinary tract infection, site not specified: Secondary | ICD-10-CM | POA: Diagnosis not present

## 2016-11-26 DIAGNOSIS — R609 Edema, unspecified: Secondary | ICD-10-CM

## 2016-11-26 DIAGNOSIS — I959 Hypotension, unspecified: Secondary | ICD-10-CM | POA: Diagnosis not present

## 2016-11-26 DIAGNOSIS — Z7901 Long term (current) use of anticoagulants: Secondary | ICD-10-CM | POA: Diagnosis not present

## 2016-11-26 DIAGNOSIS — E1165 Type 2 diabetes mellitus with hyperglycemia: Secondary | ICD-10-CM | POA: Diagnosis not present

## 2016-11-26 DIAGNOSIS — I82411 Acute embolism and thrombosis of right femoral vein: Secondary | ICD-10-CM | POA: Diagnosis not present

## 2016-11-26 DIAGNOSIS — E118 Type 2 diabetes mellitus with unspecified complications: Secondary | ICD-10-CM | POA: Diagnosis not present

## 2016-11-26 DIAGNOSIS — Z79899 Other long term (current) drug therapy: Secondary | ICD-10-CM | POA: Diagnosis not present

## 2016-11-26 DIAGNOSIS — Z87440 Personal history of urinary (tract) infections: Secondary | ICD-10-CM | POA: Diagnosis not present

## 2016-11-26 DIAGNOSIS — Z23 Encounter for immunization: Secondary | ICD-10-CM | POA: Diagnosis not present

## 2016-11-26 DIAGNOSIS — M25561 Pain in right knee: Secondary | ICD-10-CM | POA: Diagnosis not present

## 2016-11-26 DIAGNOSIS — N401 Enlarged prostate with lower urinary tract symptoms: Secondary | ICD-10-CM | POA: Diagnosis not present

## 2016-11-26 DIAGNOSIS — N186 End stage renal disease: Secondary | ICD-10-CM | POA: Diagnosis not present

## 2016-11-26 DIAGNOSIS — S72491D Other fracture of lower end of right femur, subsequent encounter for closed fracture with routine healing: Secondary | ICD-10-CM | POA: Diagnosis not present

## 2016-11-26 DIAGNOSIS — E785 Hyperlipidemia, unspecified: Secondary | ICD-10-CM | POA: Diagnosis not present

## 2016-11-26 DIAGNOSIS — G894 Chronic pain syndrome: Secondary | ICD-10-CM | POA: Diagnosis not present

## 2016-11-26 DIAGNOSIS — M25461 Effusion, right knee: Secondary | ICD-10-CM

## 2016-11-26 DIAGNOSIS — E039 Hypothyroidism, unspecified: Secondary | ICD-10-CM | POA: Diagnosis not present

## 2016-11-26 DIAGNOSIS — E1100 Type 2 diabetes mellitus with hyperosmolarity without nonketotic hyperglycemic-hyperosmolar coma (NKHHC): Secondary | ICD-10-CM | POA: Diagnosis not present

## 2016-11-26 DIAGNOSIS — S92909A Unspecified fracture of unspecified foot, initial encounter for closed fracture: Secondary | ICD-10-CM | POA: Diagnosis not present

## 2016-11-26 LAB — RENAL FUNCTION PANEL
Albumin: 2.3 g/dL — ABNORMAL LOW (ref 3.5–5.0)
Anion gap: 15 (ref 5–15)
BUN: 64 mg/dL — ABNORMAL HIGH (ref 6–20)
CO2: 24 mmol/L (ref 22–32)
Calcium: 8.3 mg/dL — ABNORMAL LOW (ref 8.9–10.3)
Chloride: 97 mmol/L — ABNORMAL LOW (ref 101–111)
Creatinine, Ser: 8.8 mg/dL — ABNORMAL HIGH (ref 0.61–1.24)
GFR calc Af Amer: 6 mL/min — ABNORMAL LOW (ref >60)
GFR calc non Af Amer: 5 mL/min — ABNORMAL LOW (ref >60)
Glucose, Bld: 88 mg/dL (ref 65–99)
Phosphorus: 8.7 mg/dL — ABNORMAL HIGH (ref 2.5–4.6)
Potassium: 4.5 mmol/L (ref 3.5–5.1)
Sodium: 136 mmol/L (ref 135–145)

## 2016-11-26 LAB — PROTIME-INR
INR: 1.94
Prothrombin Time: 22.5 seconds — ABNORMAL HIGH (ref 11.4–15.2)

## 2016-11-26 LAB — CBC
HCT: 29.3 % — ABNORMAL LOW (ref 39.0–52.0)
Hemoglobin: 9.4 g/dL — ABNORMAL LOW (ref 13.0–17.0)
MCH: 31 pg (ref 26.0–34.0)
MCHC: 32.1 g/dL (ref 30.0–36.0)
MCV: 96.7 fL (ref 78.0–100.0)
Platelets: 186 10*3/uL (ref 150–400)
RBC: 3.03 MIL/uL — ABNORMAL LOW (ref 4.22–5.81)
RDW: 19 % — ABNORMAL HIGH (ref 11.5–15.5)
WBC: 12 10*3/uL — ABNORMAL HIGH (ref 4.0–10.5)

## 2016-11-26 LAB — GLUCOSE, CAPILLARY
Glucose-Capillary: 85 mg/dL (ref 65–99)
Glucose-Capillary: 84 mg/dL (ref 65–99)

## 2016-11-26 MED ORDER — HEPARIN SODIUM (PORCINE) 1000 UNIT/ML DIALYSIS
1000.0000 [IU] | INTRAMUSCULAR | Status: DC | PRN
  Filled 2016-11-26: qty 1

## 2016-11-26 MED ORDER — LIDOCAINE-PRILOCAINE 2.5-2.5 % EX CREA
1.0000 "application " | TOPICAL_CREAM | CUTANEOUS | Status: DC | PRN
  Filled 2016-11-26: qty 5

## 2016-11-26 MED ORDER — PENTAFLUOROPROP-TETRAFLUOROETH EX AERO: 1.0000 "application " | INHALATION_SPRAY | CUTANEOUS | Status: DC | PRN

## 2016-11-26 MED ORDER — SODIUM CHLORIDE 0.9 % IV SOLN: 100.0000 mL | INTRAVENOUS | Status: DC | PRN

## 2016-11-26 MED ORDER — LIDOCAINE HCL (PF) 1 % IJ SOLN
5.0000 mL | INTRAMUSCULAR | Status: DC | PRN
Start: 2016-11-26 — End: 2016-11-26

## 2016-11-26 MED ORDER — HEPARIN SODIUM (PORCINE) 1000 UNIT/ML DIALYSIS
40.0000 [IU]/kg | Freq: Once | INTRAMUSCULAR | Status: DC
  Filled 2016-11-26: qty 4

## 2016-11-26 MED ORDER — DOXERCALCIFEROL 4 MCG/2ML IV SOLN
INTRAVENOUS | Status: AC
  Filled 2016-11-26: qty 2

## 2016-11-26 MED ORDER — WARFARIN SODIUM 1 MG PO TABS: 1.5000 mg | ORAL_TABLET | Freq: Every day | ORAL | Status: DC

## 2016-11-26 MED ORDER — ALTEPLASE 2 MG IJ SOLR: 2.0000 mg | Freq: Once | INTRAMUSCULAR | Status: DC | PRN

## 2016-11-26 NOTE — Progress Notes (Signed)
ANTICOAGULATION CONSULT NOTE - Follow Up Consult  Pharmacy Consult:  Coumadin Indication:  VTE prophylaxis  No Known Allergies  Patient Measurements: Weight: 176 lb 5.9 oz (80 kg)  Vital Signs: Temp: 99 F (37.2 C) (01/31 0643) Temp Source: Oral (01/31 0643) BP: 98/60 (01/31 1030) Pulse Rate: 101 (01/31 1030)  Labs:  Recent Labs  11/24/16 0339 11/25/16 0428 11/26/16 0613 11/26/16 0700  HGB 7.1* 10.1*  --  9.4*  HCT 21.8* 30.5*  --  29.3*  PLT 137* 148*  --  186  LABPROT 17.6* 18.8* 22.5*  --   INR 1.44 1.55 1.94  --   CREATININE 7.68* 6.47* 8.80*  --     Estimated Creatinine Clearance: 8.1 mL/min (by C-G formula based on SCr of 8.8 mg/dL (H)).     Assessment: 23 YOM presented with femur fracture from fall after dialysis session. Now s/p ORIF, and Pharmacy consulted to manage Coumadin for VTE prophylaxis x 30 days from 11/22/16 - target INR 1.7 per Ortho note. INR up to 1.94 on coumadin 3mg  daily. Hg up 9.4. S/p 2u PRBC with HD on 1/29. Plt trend up to 186. No bleed documented.   Goal of Therapy:  INR goal 1.7 per MD, aim for 1.5-2  Monitor platelets by anticoagulation protocol:  yes   Plan:  - Spoke with MD - will d/c on coumadin 1.5mg  PO daily with close INR f/u outpatient - Daily PT / INR - Monitor CBC, s/sx bleeding   Elicia Lamp, PharmD, BCPS Clinical Pharmacist 11/26/2016 10:54 AM

## 2016-11-26 NOTE — Progress Notes (Signed)
Subjective: Interval History: has no complaint .  Objective: Vital signs in last 24 hours: Temp:  [97.2 F (36.2 C)-99.6 F (37.6 C)] 99 F (37.2 C) (01/31 0643) Pulse Rate:  [96-113] 97 (01/31 0830) Resp:  [17] 17 (01/31 0409) BP: (107-121)/(60-73) 117/65 (01/31 0830) SpO2:  [97 %-99 %] 98 % (01/31 0643) Weight:  [80 kg (176 lb 5.9 oz)] 80 kg (176 lb 5.9 oz) (01/31 0643) Weight change: -3.3 kg (-7 lb 4.4 oz)  Intake/Output from previous day: 01/30 0701 - 01/31 0700 In: 950 [P.O.:840; IV Piggyback:110] Out: -  Intake/Output this shift: No intake/output data recorded.  General appearance: cooperative, no distress and slowed mentation Resp: clear to auscultation bilaterally Cardio: S1, S2 normal and systolic murmur: holosystolic 2/6, blowing at apex GI: pos bs,liver down 4 cm Extremities: AVF RUA ok, ACE on R LE  Lab Results:  Recent Labs  11/25/16 0428 11/26/16 0700  WBC 13.4* 12.0*  HGB 10.1* 9.4*  HCT 30.5* 29.3*  PLT 148* 186   BMET:  Recent Labs  11/25/16 0428 11/26/16 0613  NA 136 136  K 4.8 4.5  CL 97* 97*  CO2 25 24  GLUCOSE 112* 88  BUN 41* 64*  CREATININE 6.47* 8.80*  CALCIUM 8.7* 8.3*   No results for input(s): PTH in the last 72 hours. Iron Studies: No results for input(s): IRON, TIBC, TRANSFERRIN, FERRITIN in the last 72 hours.  Studies/Results: No results found.  I have reviewed the patient's current medications.  Assessment/Plan: 1 ESRD for HD, tol well 2 Anemia esa 3 DM controlled 4 Bp ok off meds 5 HPTH meds 6 Fevers resolved and WBC better P HD, esa, , PT    LOS: 6 days   Renika Shiflet L 11/26/2016,9:06 AM

## 2016-11-26 NOTE — Clinical Social Work Note (Signed)
Clinical Social Worker facilitated patient discharge including contacting patient family and facility to confirm patient discharge plans.  Clinical information faxed to facility and family agreeable with plan.  CSW arranged ambulance transport via [facility] to [facility] .  RN to call [phone] for report prior to discharge.  Clinical Social Worker will sign off for now as social work intervention is no longer needed. Please consult [place] again if new need arises.  [address], [place]

## 2016-11-26 NOTE — Plan of Care (Signed)
Problem: Activity: Goal: Risk for activity intolerance will decrease Outcome: Not Progressing Does not tolerate bed activity well  Problem: Bowel/Gastric: Goal: Will not experience complications related to bowel motility Outcome: Progressing Denies gastric and bowel issues

## 2016-11-26 NOTE — Procedures (Signed)
I was present at this session.  I have reviewed the session itself and made appropriate changes.  HD via RUA AVF.  Access prss ok. bp in low 100s.  Zachary [name] L 1/31/20189:06 AM

## 2016-11-26 NOTE — Discharge Summary (Signed)
PATIENT DETAILS Name: Zachary [name] Age: 71 y.o. Sex: male Date of Birth: [birth year] MRN: [id number]. Admitting Physician: Thurnell Lose, MD WUJ:[id number],YNWGNFA, MD  Admit Date: 11/20/2016 Discharge date: 11/26/2016  Recommendations for Outpatient Follow-up:  1. Follow up with PCP in 1-2 weeks 2. Please obtain BMP/CBC in one week 3. Please continue HD on TTS schedule  4. Coumadin for 30 days from 11/22/16 with target INR of 1.5 5. Check INR daily for the next few days-and adjust dosing of coumadin accordingly 6. Ensure follow up at HD clinic at usual schedule (TTS) 7. Ensure follow up with Orthopedics-Dr Delfino Lovett  Admitted From:  ALF  Disposition: SNF   Home Health: No  Equipment/Devices: No recommended devices from PT - (defer to next venue)  Discharge Condition: Stable  CODE STATUS: FULL CODE  Diet recommendation:  Heart Healthy / Carb Modified  Brief Summary: See H&P, Labs, Consult and Test reports for all details in brief. Patient is a 71 y.o. male with PMH significant for ESRD on HD TTS, mild mental delays, T2DM, HTN, and hypothyroidism. Presented to hospital on 11/20/16 post mechanical fall. Workup found right distal femur fracture. Admitted for further management. See details below.   Brief Hospital Course: Comminuted intra-articular fracture of distal femur, Right femoral fracture: Due to mechanical fall. Underwent ORIF on 11/22/16. Pain is well controlled today. Orthopedics following- remain NWB RLE with coumadin X30 days with target INR of 1.7. Today INR 1.94. Continue SCD and TED as well. Discharged with pain management medications.   Acute blood loss anemia compounded with anemia related to ESRD: Possibly due to blood loss in setting of femur fracture post ORIF. Per ortho op note about 500 mL blood loss. Received total of 4 units PRBC during this hospitalization. Yesterday Hgb was 10.1, today Hgb 9.4. Continue Epogen injection and ferrous sulfate. Resume TTS  dialysis schedule upon discharge.    Acute Leukocytosis: WBC elevated from 12.8 yesterday to 13.4. Did have low-grade fever, but none for the past 24 hours. He is a complete nontoxic appearance without any foci of infection apparent. Suspect leukocytosis likely secondary to inflammation/recent surgery. Please recheck CBC in 1 week.   Uncontrolled T2DM: CBG remained stable during hospitalization stay, oral medications were held and novoLOG sliding scale was used for glucose management. Upon discharge continue linaglipitin.  Hypothyroidism: Continue synthroid.    Hypertension: Controlled without medication, continue BP monitoring.   Dyslipidemia: Continue statin and niacin.   BPH: Continue tamsulosin.    GERD: Continue PPI   Procedures/Studies:  ORIF on 11/22/16.  Discharge Diagnoses:  Principal Problem:   Right femoral fracture (HCC) Active Problems:   DM (diabetes mellitus), type 2, uncontrolled ([place])   Mental retardation   Benign prostatic hyperplasia with urinary retention   Hyperlipidemia   End stage renal disease (HCC)   Closed comminuted intra-articular fracture of distal femur, right, initial encounter (HCC)   Postoperative anemia due to acute blood loss   Discharge Instructions:  Activity:  NWB RLE  Discharge Instructions    Activity order    Complete by:  As directed    Non weight bearing to Right lower extremity   Call MD for:  difficulty breathing, headache or visual disturbances    Complete by:  As directed    Call MD for:  severe uncontrolled pain    Complete by:  As directed    Call MD for:  temperature >100.4    Complete by:  As directed    Diet - low  sodium heart healthy    Complete by:  As directed      Allergies as of 11/26/2016   No Known Allergies     Medication List    STOP taking these medications   prednisoLONE acetate 1 % ophthalmic suspension Commonly known as:  PRED FORTE     TAKE these medications   atorvastatin 40 MG  tablet Commonly known as:  LIPITOR Take 40 mg by mouth every evening.   carvedilol 6.25 MG tablet Commonly known as:  COREG Take 1 tablet (6.25 mg total) by mouth 2 (two) times daily with a meal.   epoetin alfa 4000 UNIT/ML injection Commonly known as:  EPOGEN,PROCRIT Inject 4,000 Units into the vein every 14 (fourteen) days.   ferrous sulfate 325 (65 FE) MG tablet Take 325 mg by mouth 2 (two) times daily with a meal.   FIRST AID PLUS LIDOCAINE EX Apply 1 application topically 3 (three) times a week. Prior to dialysis   HYDROcodone-acetaminophen 5-325 MG tablet Commonly known as:  NORCO/VICODIN Take 1-2 tablets by mouth every 6 (six) hours as needed for moderate pain.   isosorbide-hydrALAZINE 20-37.5 MG tablet Commonly known as:  BIDIL Take 1 tablet by mouth 3 (three) times daily.   levothyroxine 75 MCG tablet Commonly known as:  SYNTHROID, LEVOTHROID Take 75 mcg by mouth daily before breakfast.   linagliptin 5 MG Tabs tablet Commonly known as:  TRADJENTA Take 5 mg by mouth daily.   multivitamin with minerals Tabs tablet Take 1 tablet by mouth daily.   omeprazole 20 MG capsule Commonly known as:  PRILOSEC Take 40 mg by mouth daily.   tamsulosin 0.4 MG Caps capsule Commonly known as:  FLOMAX Take 0.4 mg by mouth daily.   torsemide 10 MG tablet Commonly known as:  DEMADEX Take 5 tablets (50 mg total) by mouth daily.   Vitamin D (Ergocalciferol) 50000 units Caps capsule Commonly known as:  DRISDOL Take 1 capsule (50,000 Units total) by mouth every 7 (seven) days. What changed:  when to take this   warfarin 1 MG tablet Commonly known as:  COUMADIN Take 1.5 tablets (1.5 mg total) by mouth daily at 6 PM.      Follow-up Information    Swinteck, Horald Pollen, MD. Schedule an appointment as soon as possible for a visit in 2 week(s).   Specialty:  Orthopedic Surgery Why:  For wound re-check, For suture removal Contact information: [address] [place] [address] [phone]        FANTA,TESFAYE, MD Follow up in 1 week(s).   Specialty:  Internal Medicine Why:  Resume TTS dialysis schedule Contact information: [address] [place] [address] [phone]        Hemodialysis center Follow up.   Why:  at your usual schedule on TTS         No Known Allergies    Consultations:  [place]    Physical Therapy Evaluation   Pharmacy Anticoagulation Consult     Nephrology HD / ESRD Anemia Consult    Other Procedures/Studies: Dg Chest 1 View  Result Date: 11/20/2016 CLINICAL DATA:  Femur fracture. Shortness of breath. Preoperative evaluation. EXAM: CHEST 1 VIEW COMPARISON:  03/06/2016 FINDINGS: Heart size is normal. Mediastinal shadows are normal. Right lung is clear. There is mild atelectasis at the left base. No effusion. No acute bone finding. IMPRESSION: Mild atelectasis at the left base. Electronically Signed   By: Nelson Chimes M.D.   On: 11/20/2016 15:25  Dg Knee 2 Views Right  Result Date: 11/20/2016 CLINICAL DATA:  Pain and swelling RIGHT knee EXAM: RIGHT KNEE - 1-2 VIEW COMPARISON:  None FINDINGS: Osseous demineralization. Joint spaces preserved. Comminuted fracture of the distal RIGHT femoral metaphysis with apex anterior angulation and mild posterior displacement. Intra-articular extension into the intercondylar notch. Associated soft tissue swelling and deformity. No additional fracture, dislocation, or bone destruction. IMPRESSION: Comminuted displaced and angulated fracture of the distal RIGHT femoral metaphysis with intra-articular extension into the RIGHT knee. Osseous demineralization. Electronically Signed   By: Lavonia Dana M.D.   On: 11/20/2016 13:41   Ct Knee Right Wo Contrast  Result Date: 11/20/2016 CLINICAL DATA:  Femoral fracture after fall at dialysis. EXAM: CT OF THE RIGHT KNEE WITHOUT CONTRAST TECHNIQUE: Multidetector CT imaging of the RIGHT knee was  performed according to the standard protocol. Multiplanar CT image reconstructions were also generated. COMPARISON:  Radiographs from 11/20/2016 of the right knee FINDINGS: Bones/Joint/Cartilage Acute, comminuted and closed T-shaped supracondylar fracture of the right femur with sagittal fracture component extending through the intercondylar notch into the femorotibial compartment along the medial aspect of the lateral femoral condyle. The fracture involving the lateral femoral condyle causes the articular surface to be disrupted by 4 mm along the anterior weight-bearing portion of the condyles by 4 mm on the sagittal reformats (image 55,series 6) will you. Fracture also extends into the lateral patellofemoral compartment. Lipohemarthrosis is noted. There is 1/4 shaft with displacement of the distal fracture fragment. A nondisplaced sagittally oriented posterior cortical fracture extends more proximally of the diaphysis of the femoral shaft, incompletely included that extends at least to the junction of the middle and distal third when correlated with same day radiographs. No tibial fracture. Ligaments Suboptimally assessed by CT. Muscles and Tendons Diffuse periarticular intramuscular edema without intramuscular hematoma. Soft tissues Lipohemarthrosis of the knee joint. Periarticular soft tissue edema. IMPRESSION: Acute, closed, comminuted and dorsally displaced T-shaped supracondylar fracture of the right femur extending into the femorotibial and patellofemoral compartments as described with associated lipohemarthrosis. No tibial fracture. There is a nondisplaced posterior diaphyseal fracture of the femoral shaft that extends more proximally at least to the junction of the middle and distal third. The proximal extent is not entirely included on this study of the knee. Electronically Signed   By: Ashley Royalty M.D.   On: 11/20/2016 19:27   Dg C-arm 1-60 Min  Result Date: 11/22/2016 CLINICAL DATA:  ORIF distal  right femoral fracture EXAM: DG C-ARM 61-120 MIN; RIGHT KNEE - 3 VIEW COMPARISON:  11/20/2016 FINDINGS: Plate and screw fixation across the distal right femoral fracture. Near anatomic alignment. No hardware complicating feature. IMPRESSION: Internal fixation across the distal right femoral fracture. No visible complicating feature. Electronically Signed   By: Rolm Baptise M.D.   On: 11/22/2016 13:50   Dg Femur Port, Min 2 Views Right  Result Date: 11/22/2016 CLINICAL DATA:  Postop check. EXAM: RIGHT FEMUR PORTABLE 2 VIEW COMPARISON:  11/20/2016 and 11/22/2016 FINDINGS: Examination demonstrates a lateral fixation plate with screws bridging patient's distal femoral fracture with hardware intact and near anatomic alignment over the fracture site. Skin staples are present over the lateral soft tissues. Separate orthopedic screw extending from anterior to posterior over the lateral femoral condyles intact. IMPRESSION: Post fixation of distal femoral fracture with hardware intact and near anatomic alignment about the fracture site. Electronically Signed   By: Marin Olp M.D.   On: 11/22/2016 17:00   Dg Knee 2 Views Right  Result  Date: 11/22/2016 CLINICAL DATA:  ORIF distal right femoral fracture EXAM: DG C-ARM 61-120 MIN; RIGHT KNEE - 3 VIEW COMPARISON:  11/20/2016 FINDINGS: Plate and screw fixation across the distal right femoral fracture. Near anatomic alignment. No hardware complicating feature. IMPRESSION: Internal fixation across the distal right femoral fracture. No visible complicating feature. Electronically Signed   By: Rolm Baptise M.D.   On: 11/22/2016 13:50     TODAY-DAY OF DISCHARGE:  Subjective:   Zachary [name] today has no headache,no chest abdominal pain,no new weakness tingling or numbness, feels much better wants to go home today.   Objective:   Blood pressure 98/60, pulse (!) 101, temperature 99 F (37.2 C), temperature source Oral, resp. rate 17, weight 80 kg (176 lb 5.9 oz),  SpO2 98 %.  Intake/Output Summary (Last 24 hours) at 11/26/16 1057 Last data filed at 11/25/16 1900  Gross per 24 hour  Intake              830 ml  Output                0 ml  Net              830 ml   Filed Weights   11/24/16 1225 11/25/16 0500 11/26/16 0643  Weight: 80.4 kg (177 lb 4 oz) 82.5 kg (181 lb 14.1 oz) 80 kg (176 lb 5.9 oz)    Exam:  Skin: No Cyanosis, Clubbing or edema, No new Rash or bruise HEENT: [place].AT,PERRLA, Supple Neck,No JVD, No cervical lymphadenopathy appriciated.  PULM:Symmetrical Chest wall movement, Good air movement bilaterally, CTAB Cardiac:RRR,No Gallops,Rubs or new Murmurs, No Parasternal Heave GI: +ve B.Sounds, Abd Soft, Non tender, No organomegaly appriciated, No rebound -guarding or rigidity. Extremities: RLE (+) wiggle toes, warm to palpation, no edema. LLE no edema, warm to palpation.Sensation grossly intact b/l.  Neuro: AAO *3, No new F.N deficits, Normal affect    PERTINENT RADIOLOGIC STUDIES: Dg Chest 1 View  Result Date: 11/20/2016 CLINICAL DATA:  Femur fracture. Shortness of breath. Preoperative evaluation. EXAM: CHEST 1 VIEW COMPARISON:  03/06/2016 FINDINGS: Heart size is normal. Mediastinal shadows are normal. Right lung is clear. There is mild atelectasis at the left base. No effusion. No acute bone finding. IMPRESSION: Mild atelectasis at the left base. Electronically Signed   By: Nelson Chimes M.D.   On: 11/20/2016 15:25   Dg Knee 2 Views Right  Result Date: 11/20/2016 CLINICAL DATA:  Pain and swelling RIGHT knee EXAM: RIGHT KNEE - 1-2 VIEW COMPARISON:  None FINDINGS: Osseous demineralization. Joint spaces preserved. Comminuted fracture of the distal RIGHT femoral metaphysis with apex anterior angulation and mild posterior displacement. Intra-articular extension into the intercondylar notch. Associated soft tissue swelling and deformity. No additional fracture, dislocation, or bone destruction. IMPRESSION: Comminuted displaced and angulated  fracture of the distal RIGHT femoral metaphysis with intra-articular extension into the RIGHT knee. Osseous demineralization. Electronically Signed   By: Lavonia Dana M.D.   On: 11/20/2016 13:41   Ct Knee Right Wo Contrast  Result Date: 11/20/2016 CLINICAL DATA:  Femoral fracture after fall at dialysis. EXAM: CT OF THE RIGHT KNEE WITHOUT CONTRAST TECHNIQUE: Multidetector CT imaging of the RIGHT knee was performed according to the standard protocol. Multiplanar CT image reconstructions were also generated. COMPARISON:  Radiographs from 11/20/2016 of the right knee FINDINGS: Bones/Joint/Cartilage Acute, comminuted and closed T-shaped supracondylar fracture of the right femur with sagittal fracture component extending through the intercondylar notch into the femorotibial compartment along the medial aspect of  the lateral femoral condyle. The fracture involving the lateral femoral condyle causes the articular surface to be disrupted by 4 mm along the anterior weight-bearing portion of the condyles by 4 mm on the sagittal reformats (image 55,series 6) will you. Fracture also extends into the lateral patellofemoral compartment. Lipohemarthrosis is noted. There is 1/4 shaft with displacement of the distal fracture fragment. A nondisplaced sagittally oriented posterior cortical fracture extends more proximally of the diaphysis of the femoral shaft, incompletely included that extends at least to the junction of the middle and distal third when correlated with same day radiographs. No tibial fracture. Ligaments Suboptimally assessed by CT. Muscles and Tendons Diffuse periarticular intramuscular edema without intramuscular hematoma. Soft tissues Lipohemarthrosis of the knee joint. Periarticular soft tissue edema. IMPRESSION: Acute, closed, comminuted and dorsally displaced T-shaped supracondylar fracture of the right femur extending into the femorotibial and patellofemoral compartments as described with associated  lipohemarthrosis. No tibial fracture. There is a nondisplaced posterior diaphyseal fracture of the femoral shaft that extends more proximally at least to the junction of the middle and distal third. The proximal extent is not entirely included on this study of the knee. Electronically Signed   By: Ashley Royalty M.D.   On: 11/20/2016 19:27   Dg C-arm 1-60 Min  Result Date: 11/22/2016 CLINICAL DATA:  ORIF distal right femoral fracture EXAM: DG C-ARM 61-120 MIN; RIGHT KNEE - 3 VIEW COMPARISON:  11/20/2016 FINDINGS: Plate and screw fixation across the distal right femoral fracture. Near anatomic alignment. No hardware complicating feature. IMPRESSION: Internal fixation across the distal right femoral fracture. No visible complicating feature. Electronically Signed   By: Rolm Baptise M.D.   On: 11/22/2016 13:50   Dg Femur Port, Min 2 Views Right  Result Date: 11/22/2016 CLINICAL DATA:  Postop check. EXAM: RIGHT FEMUR PORTABLE 2 VIEW COMPARISON:  11/20/2016 and 11/22/2016 FINDINGS: Examination demonstrates a lateral fixation plate with screws bridging patient's distal femoral fracture with hardware intact and near anatomic alignment over the fracture site. Skin staples are present over the lateral soft tissues. Separate orthopedic screw extending from anterior to posterior over the lateral femoral condyles intact. IMPRESSION: Post fixation of distal femoral fracture with hardware intact and near anatomic alignment about the fracture site. Electronically Signed   By: Marin Olp M.D.   On: 11/22/2016 17:00   Dg Knee 2 Views Right  Result Date: 11/22/2016 CLINICAL DATA:  ORIF distal right femoral fracture EXAM: DG C-ARM 61-120 MIN; RIGHT KNEE - 3 VIEW COMPARISON:  11/20/2016 FINDINGS: Plate and screw fixation across the distal right femoral fracture. Near anatomic alignment. No hardware complicating feature. IMPRESSION: Internal fixation across the distal right femoral fracture. No visible complicating feature.  Electronically Signed   By: Rolm Baptise M.D.   On: 11/22/2016 13:50     PERTINENT LAB RESULTS: CBC:  Recent Labs  11/25/16 0428 11/26/16 0700  WBC 13.4* 12.0*  HGB 10.1* 9.4*  HCT 30.5* 29.3*  PLT 148* 186   CMET CMP     Component Value Date/Time   NA 136 11/26/2016 0613   K 4.5 11/26/2016 0613   CL 97 (L) 11/26/2016 0613   CO2 24 11/26/2016 0613   GLUCOSE 88 11/26/2016 0613   BUN 64 (H) 11/26/2016 0613   CREATININE 8.80 (H) 11/26/2016 0613   CALCIUM 8.3 (L) 11/26/2016 0613   CALCIUM 8.3 (L) 03/03/2015 0622   PROT 7.1 11/20/2016 1446   ALBUMIN 2.3 (L) 11/26/2016 0613   AST 27 11/20/2016 1446   ALT  33 11/20/2016 1446   ALKPHOS 77 11/20/2016 1446   BILITOT 0.4 11/20/2016 1446   GFRNONAA 5 (L) 11/26/2016 0613   GFRAA 6 (L) 11/26/2016 0613    GFR Estimated Creatinine Clearance: 8.1 mL/min (by C-G formula based on SCr of 8.8 mg/dL (H)). No results for input(s): LIPASE, AMYLASE in the last 72 hours. No results for input(s): CKTOTAL, CKMB, CKMBINDEX, TROPONINI in the last 72 hours. Invalid input(s): POCBNP No results for input(s): DDIMER in the last 72 hours. No results for input(s): HGBA1C in the last 72 hours. No results for input(s): CHOL, HDL, LDLCALC, TRIG, CHOLHDL, LDLDIRECT in the last 72 hours. No results for input(s): TSH, T4TOTAL, T3FREE, THYROIDAB in the last 72 hours.  Invalid input(s): FREET3 No results for input(s): VITAMINB12, FOLATE, FERRITIN, TIBC, IRON, RETICCTPCT in the last 72 hours. Coags:  Recent Labs  11/25/16 0428 11/26/16 0613  INR 1.55 1.94   Microbiology: Recent Results (from the past 240 hour(s))  Surgical PCR screen     Status: None   Collection Time: 11/21/16 12:05 AM  Result Value Ref Range Status   MRSA, PCR NEGATIVE NEGATIVE Final   Staphylococcus aureus NEGATIVE NEGATIVE Final    Comment:        The Xpert SA Assay (FDA approved for NASAL specimens in patients over 66 years of age), is one component of a comprehensive  surveillance program.  Test performance has been validated by [facility] for patients greater than or equal to 22 year old. It is not intended to diagnose infection nor to guide or monitor treatment.     FURTHER DISCHARGE INSTRUCTIONS:  Get Medicines reviewed and adjusted: Please take all your medications with you for your next visit with your Primary MD  Laboratory/radiological data: Please request your Primary MD to go over all hospital tests and procedure/radiological results at the follow up, please ask your Primary MD to get all Hospital records sent to his/her office.  In some cases, they will be blood work, cultures and biopsy results pending at the time of your discharge. Please request that your primary care M.D. goes through all the records of your hospital data and follows up on these results.  Also Note the following: If you experience worsening of your admission symptoms, develop shortness of breath, life threatening emergency, suicidal or homicidal thoughts you must seek medical attention immediately by calling 911 or calling your MD immediately  if symptoms less severe.  You must read complete instructions/literature along with all the possible adverse reactions/side effects for all the Medicines you take and that have been prescribed to you. Take any new Medicines after you have completely understood and accpet all the possible adverse reactions/side effects.   Do not drive when taking Pain medications or sleeping medications (Benzodaizepines)  Do not take more than prescribed Pain, Sleep and Anxiety Medications. It is not advisable to combine anxiety,sleep and pain medications without talking with your primary care practitioner  Special Instructions: If you have smoked or chewed Tobacco  in the last 2 yrs please stop smoking, stop any regular Alcohol  and or any Recreational drug use.  Wear Seat belts while driving.  Please note: You were cared for by a hospitalist  during your hospital stay. Once you are discharged, your primary care physician will handle any further medical issues. Please note that NO REFILLS for any discharge medications will be authorized once you are discharged, as it is imperative that you return to your primary care physician (or establish a  relationship with a primary care physician if you do not have one) for your post hospital discharge needs so that they can reassess your need for medications and monitor your lab values.  Total Time spent coordinating discharge including counseling, education and face to face time equals 45 minutes.  Signed: Bevely Palmer, PAS  Law[facility] [place] 11/26/2016 10:57 AM   Attending MD note  Patient was seen, examined,treatment plan was discussed with the PA-S.  I have personally reviewed the clinical findings, lab, imaging studies and management of this patient in detail. I agree with the documentation, as recorded by the PA-S.   Patient is doing well-afebrile. Seen earlier at hemodialysis  On Exam: Gen. exam: Awake, alert, not in any distress Chest: Good air entry bilaterally, no rhonchi or rales CVS: S1-S2 regular, no murmurs Abdomen: Soft, nontender and nondistended Neurology: Non-focal Skin: No rash or lesions  Impression/plan: Comminuted intra-articular fracture of distal femur, Right femoral fracture-status post ORIF. Nonweightbearing per orthopedics. On Coumadin for 30 days with target INR of 1.7. Stable for discharge today  Acute perioperative blood loss anemia: Transfused a total of 4 units of PRBC so far-hemoglobin currently stable  Low-grade fever/slight leukocytosis: No focal low-grade fever overnight, leukocytosis very minimal. Suspect leukocytosis due to recent surgery rather than any infection. Stable for discharge to SNF today  ESRD: Renal followed during this hospital stay-plans are to resume hemodialysis at usual schedule on discharge  Rest as  above  [facility]

## 2016-11-26 NOTE — Clinical Social Work Placement (Signed)
   CLINICAL SOCIAL WORK PLACEMENT  NOTE  Date:  11/26/2016  Patient Details  Name: Zachary [name] MRN: [id number] Date of Birth: Oct 14, 1946  Clinical Social Work is seeking post-discharge placement for this patient at the [place] level of care (*CSW will initial, date and re-position this form in  chart as items are completed):  Yes   Patient/family provided with [place] Work Department's list of facilities offering this level of care within the geographic area requested by the patient (or if unable, by the patient's family).  Yes   Patient/family informed of their freedom to choose among providers that offer the needed level of care, that participate in Medicare, Medicaid or managed care program needed by the patient, have an available bed and are willing to accept the patient.  Yes   Patient/family informed of [facility]'s ownership interest in [facility] and [facility], as well as of the fact that they are under no obligation to receive care at these facilities.  PASRR submitted to EDS on 11/24/16     PASRR number received on       Existing PASRR number confirmed on       FL2 transmitted to all facilities in geographic area requested by pt/family on 11/24/16     FL2 transmitted to all facilities within larger geographic area on       Patient informed that his/her managed care company has contracts with or will negotiate with certain facilities, including the following:        Yes   Patient/family informed of bed offers received.  Patient chooses bed at [facility]     Physician recommends and patient chooses bed at      Patient to be transferred to [facility] on 11/26/16.  Patient to be transferred to facility by [facility]     Patient family notified on 11/26/16 of transfer.  Name of family member notified:  Malinda     PHYSICIAN Please sign FL2     Additional Comment:     _______________________________________________ Alla German, LCSW 11/26/2016, 11:18 AM

## 2016-11-26 NOTE — Progress Notes (Signed)
Patient to be discharged to [facility]. Nurse called to give report to [facility].  IV removed. Patient is waiting for [facility] for transportation.

## 2016-11-27 DIAGNOSIS — D509 Iron deficiency anemia, unspecified: Secondary | ICD-10-CM | POA: Diagnosis not present

## 2016-11-27 DIAGNOSIS — N186 End stage renal disease: Secondary | ICD-10-CM | POA: Diagnosis not present

## 2016-11-27 DIAGNOSIS — D631 Anemia in chronic kidney disease: Secondary | ICD-10-CM | POA: Diagnosis not present

## 2016-11-27 DIAGNOSIS — Z23 Encounter for immunization: Secondary | ICD-10-CM | POA: Diagnosis not present

## 2016-11-27 DIAGNOSIS — N2581 Secondary hyperparathyroidism of renal origin: Secondary | ICD-10-CM | POA: Diagnosis not present

## 2016-11-27 DIAGNOSIS — E162 Hypoglycemia, unspecified: Secondary | ICD-10-CM | POA: Diagnosis not present

## 2016-11-28 ENCOUNTER — Non-Acute Institutional Stay (SKILLED_NURSING_FACILITY): Payer: Medicare Other | Admitting: Internal Medicine

## 2016-11-28 ENCOUNTER — Encounter: Payer: Self-pay | Admitting: Internal Medicine

## 2016-11-28 ENCOUNTER — Encounter (HOSPITAL_COMMUNITY)
Admission: AD | Admit: 2016-11-28 | Discharge: 2016-11-28 | Disposition: A | Discharge status: Home / Self Care | Payer: Medicare Other | Source: Skilled Nursing Facility | Attending: Internal Medicine | Admitting: Internal Medicine

## 2016-11-28 DIAGNOSIS — I10 Essential (primary) hypertension: Secondary | ICD-10-CM | POA: Diagnosis not present

## 2016-11-28 DIAGNOSIS — E1100 Type 2 diabetes mellitus with hyperosmolarity without nonketotic hyperglycemic-hyperosmolar coma (NKHHC): Secondary | ICD-10-CM

## 2016-11-28 DIAGNOSIS — S72001A Fracture of unspecified part of neck of right femur, initial encounter for closed fracture: Secondary | ICD-10-CM

## 2016-11-28 DIAGNOSIS — R1110 Vomiting, unspecified: Secondary | ICD-10-CM | POA: Diagnosis not present

## 2016-11-28 DIAGNOSIS — N186 End stage renal disease: Secondary | ICD-10-CM | POA: Diagnosis not present

## 2016-11-28 DIAGNOSIS — J101 Influenza due to other identified influenza virus with other respiratory manifestations: Secondary | ICD-10-CM | POA: Insufficient documentation

## 2016-11-28 LAB — AMYLASE: Amylase: 189 U/L — ABNORMAL HIGH (ref 28–100)

## 2016-11-28 LAB — CBC WITH DIFFERENTIAL/PLATELET
Basophils Absolute: 0 10*3/uL (ref 0.0–0.1)
Basophils Relative: 0 %
Eosinophils Absolute: 0.2 10*3/uL (ref 0.0–0.7)
Eosinophils Relative: 2 %
HCT: 30.9 % — ABNORMAL LOW (ref 39.0–52.0)
Hemoglobin: 9.9 g/dL — ABNORMAL LOW (ref 13.0–17.0)
Lymphocytes Relative: 10 %
Lymphs Abs: 1.3 10*3/uL (ref 0.7–4.0)
MCH: 32 pg (ref 26.0–34.0)
MCHC: 32 g/dL (ref 30.0–36.0)
MCV: 100 fL (ref 78.0–100.0)
Monocytes Absolute: 2.4 10*3/uL — ABNORMAL HIGH (ref 0.1–1.0)
Monocytes Relative: 18 %
Neutro Abs: 9.7 10*3/uL — ABNORMAL HIGH (ref 1.7–7.7)
Neutrophils Relative %: 70 %
Platelets: 323 10*3/uL (ref 150–400)
RBC: 3.09 MIL/uL — ABNORMAL LOW (ref 4.22–5.81)
RDW: 18.3 % — ABNORMAL HIGH (ref 11.5–15.5)
WBC: 13.7 10*3/uL — ABNORMAL HIGH (ref 4.0–10.5)

## 2016-11-28 LAB — COMPREHENSIVE METABOLIC PANEL
ALT: <5 U/L — ABNORMAL LOW (ref 17–63)
AST: 27 U/L (ref 15–41)
Albumin: 2.9 g/dL — ABNORMAL LOW (ref 3.5–5.0)
Alkaline Phosphatase: 68 U/L (ref 38–126)
Anion gap: 17 — ABNORMAL HIGH (ref 5–15)
BUN: 47 mg/dL — ABNORMAL HIGH (ref 6–20)
CO2: 24 mmol/L (ref 22–32)
Calcium: 8.9 mg/dL (ref 8.9–10.3)
Chloride: 97 mmol/L — ABNORMAL LOW (ref 101–111)
Creatinine, Ser: 5.89 mg/dL — ABNORMAL HIGH (ref 0.61–1.24)
GFR calc Af Amer: 10 mL/min — ABNORMAL LOW (ref >60)
GFR calc non Af Amer: 9 mL/min — ABNORMAL LOW (ref >60)
Glucose, Bld: 123 mg/dL — ABNORMAL HIGH (ref 65–99)
Potassium: 4 mmol/L (ref 3.5–5.1)
Sodium: 138 mmol/L (ref 135–145)
Total Bilirubin: 0.9 mg/dL (ref 0.3–1.2)
Total Protein: 7.6 g/dL (ref 6.5–8.1)

## 2016-11-28 LAB — LIPASE, BLOOD: Lipase: 65 U/L — ABNORMAL HIGH (ref 11–51)

## 2016-11-28 LAB — PROTIME-INR
INR: 2.83
Prothrombin Time: 30.4 seconds — ABNORMAL HIGH (ref 11.4–15.2)

## 2016-11-28 NOTE — Progress Notes (Signed)
Location:   [place] Room Number: 112D Place of Service:  SNF (31) Provider:  Glennon Hamilton, MD  Patient Care Team: [name], MD as PCP - General (Internal Medicine) Cassandria Anger, MD as Consulting Physician (Endocrinology) Fran Lowes, MD as Consulting Physician (Nephrology)  Extended Emergency Contact Information Primary Emergency Contact: Spell,Malinda Address: DSS  [place] Phone: [phone] Work Phone: [phone] Relation: Legal Guardian Secondary Emergency Contact: Isais,Richard Address: [address]          [place], [place] [address] [place] Phone: [phone] Relation: Brother  Code Status:  Full Code Goals of care: Advanced Directive information Advanced Directives 11/28/2016  Does Patient Have a Medical Advance Directive? Yes  Type of Advance Directive (No Data)  Does patient want to make changes to medical advance directive? No - Patient declined  Copy of [place] in Chart? -  Would patient like information on creating a medical advance directive? -  Pre-existing out of facility DNR order (yellow form or pink MOST form) -     Chief Complaint  Patient presents with  . Acute Visit    Nausea,vomiting    HPI:  Pt is a 71 y.o. male seen today for an acute visit for Nausea and vomiting-also for follow-up of hospitalization for a right hip fracture status post repair  Patient was admitted this facility on January 31 after hospitalization for a right distal femur fracture after a fall--of note he is receiving hemodialysis with a history of end-stage renal disease.  Underwent ORIF on 11/22/2016-he is on Coumadin for 30 days for DVT prophylaxis with target INR apparently between 1.5 and 1.7-INR on discharge from hospital was 1.94.  He also had acute blood loss anemia compounded with chronic renal disease anemia-he did receive 4 units of packed  red blood cells during hospitalization hemoglobin rose to 10.1 on discharge from hospital was 9.4 on January 31-he does continue on iron as well as Epogen injections  He also had acute leukocytosis white blood cell with was elevated in the hospital on discharge was 12,000--which actually was down from the level of the day previous of 13,400--- apparently a low-grade temp in the hospital but this resolved-it was thought leukocytosis secondary to inflammation from the recent surgery recommendation to check CBC in a week.  Patient also has a history of type 2 diabetes CBGs apparently were stable during hospitalization they have been largely in the low 100s here when checked in the a.m. he was restarted on Linafliptin upon discharge.  Currently he is resting in bed comfortably doesn't have any complaints denies any abdominal pain apparently he did vomit a couple times over night and did receive a dose of Zofran  This AM apparently with good effect.  Apparently since his admission here he has been eating quite poorly-he did have a bowel movement large one apparently last night.  Vital signs are stable he is borderline tachycardic at times I got a pulse of 100 on exam blood pressure is stable he did have a temperature of 99.7 yesterday today it is 97.8.  Again he has no complaints of shortness of breath chest pain abdominal discomfort is resting in bed comfortably     Past Medical History:  Diagnosis Date  . Abnormal CT scan, kidney 10/06/2011  . Acute pyelonephritis 10/07/2011  . Anemia    normocytic  . Anxiety    mental retardation  . Bladder wall thickening 10/06/2011  .  BPH (benign prostatic hypertrophy)   . Diabetes mellitus   . Edema     history of lower extremity edema  . Heme positive stool   . Hydronephrosis   . Hyperkalemia   . Hyperlipidemia   . Hypernatremia   . Hypertension   . Hypothyroidism   . Impaired speech   . 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 MR (mental  retardation)   . Obstructive uropathy   . 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 Poor historian poor historian  . Protein calorie malnutrition ([place])   . Pyelonephritis   . Renal failure (ARF), acute on chronic (HCC)   . Renal insufficiency    chronic history  . Smoking   . Uremia   . Urinary retention   . UTI (lower urinary tract infection) 10/06/2011  . UTI (lower urinary tract infection)    Past Surgical History:  Procedure Laterality Date  . AV FISTULA PLACEMENT Left 07/06/2015   Procedure:  INSERTION LEFT ARM ARTERIOVENOUS GORTEX GRAFT;  Surgeon: Angelia Mould, MD;  Location: [place] [facility];  Service: Vascular;  Laterality: Left;  . AV FISTULA PLACEMENT Right 02/26/2016   Procedure: ARTERIOVENOUS (AV) FISTULA CREATION ;  Surgeon: Angelia Mould, MD;  Location: [place];  Service: Vascular;  Laterality: Right;  . [place] REMOVAL Left 10/09/2015   Procedure: REMOVAL OF ARTERIOVENOUS GORETEX GRAFT ([place]) Evacuation of Lymphocele, Vein Patch angioplasty of brachial artery.;  Surgeon: Angelia Mould, MD;  Location: [place];  Service: Vascular;  Laterality: Left;  . BASCILIC VEIN TRANSPOSITION Right 02/26/2016   Procedure: Right BASCILIC VEIN TRANSPOSITION;  Surgeon: Angelia Mould, MD;  Location: [place];  Service: Vascular;  Laterality: Right;  . CIRCUMCISION N/A 01/04/2014   Procedure: CIRCUMCISION ADULT (procedure #1);  Surgeon: Marissa Nestle, MD;  Location: [facility];  Service: Urology;  Laterality: N/A;  . CYSTOSCOPY W/ RETROGRADES Bilateral 06/29/2015   Procedure: CYSTOSCOPY, DILATION OF URETHRAL STRICTURE WITH BILATERAL RETROGRADE PYELOGRAM,SUPRAPUBIC TUBE CHANGE;  Surgeon: Festus Aloe, MD;  Location: [facility];  Service: Urology;  Laterality: Bilateral;  . CYSTOSCOPY WITH URETHRAL DILATATION N/A 12/29/2013   Procedure: CYSTOSCOPY WITH URETHRAL DILATATION;  Surgeon: Marissa Nestle, MD;  Location: [facility];  Service: Urology;  Laterality: N/A;  . ORIF FEMUR FRACTURE Right 11/22/2016     Procedure: OPEN REDUCTION INTERNAL FIXATION (ORIF) DISTAL FEMUR FRACTURE;  Surgeon: Rod Can, MD;  Location: [facility];  Service: Orthopedics;  Laterality: Right;  . PERIPHERAL VASCULAR CATHETERIZATION N/A 10/08/2015   Procedure: A/V Shuntogram;  Surgeon: Angelia Mould, MD;  Location: [place] [facility];  Service: Cardiovascular;  Laterality: N/A;  . TRANSURETHRAL RESECTION OF PROSTATE N/A 01/04/2014   Procedure: TRANSURETHRAL RESECTION OF THE PROSTATE (TURP) (procedure #2);  Surgeon: Marissa Nestle, MD;  Location: [facility];  Service: Urology;  Laterality: N/A;    No Known Allergies  Current Outpatient Prescriptions on File Prior to Visit  Medication Sig Dispense Refill  . atorvastatin (LIPITOR) 40 MG tablet Take 40 mg by mouth every evening.    . carvedilol (COREG) 6.25 MG tablet Take 1 tablet (6.25 mg total) by mouth 2 (two) times daily with a meal. 60 tablet 3  . epoetin alfa (EPOGEN,PROCRIT) 4000 UNIT/ML injection Inject 4,000 Units into the vein every 14 (fourteen) days.    . ferrous sulfate 325 (65 FE) MG tablet Take 325 mg by mouth 2 (two) times daily with a meal.     . HYDROcodone-acetaminophen (NORCO/VICODIN) 5-325 MG tablet Take 1-2 tablets  by mouth every 6 (six) hours as needed for moderate pain. 30 tablet 0  . isosorbide-hydrALAZINE (BIDIL) 20-37.5 MG per tablet Take 1 tablet by mouth 3 (three) times daily. 90 tablet 3  . levothyroxine (SYNTHROID, LEVOTHROID) 75 MCG tablet Take 75 mcg by mouth daily before breakfast.     . linagliptin (TRADJENTA) 5 MG TABS tablet Take 5 mg by mouth daily.    . Multiple Vitamin (MULTIVITAMIN WITH MINERALS) TABS tablet Take 1 tablet by mouth daily.    . Neo-Bacit-Poly-Lidocaine (FIRST AID PLUS LIDOCAINE EX) Apply 1 application topically 3 (three) times a week. Prior to dialysis    . omeprazole (PRILOSEC) 20 MG capsule Take 40 mg by mouth daily.    . tamsulosin (FLOMAX) 0.4 MG CAPS capsule Take 0.4 mg by mouth daily.    Marland Kitchen torsemide  (DEMADEX) 10 MG tablet Take 5 tablets (50 mg total) by mouth daily. 30 tablet 3  . Vitamin D, Ergocalciferol, (DRISDOL) 50000 UNITS CAPS capsule Take 1 capsule (50,000 Units total) by mouth every 7 (seven) days. 30 capsule 3   No current facility-administered medications on file prior to visit.      Review of Systems   This is limited since patient appears to be a poor historian he does have some history of mental retardation and does not speak much.  In general does not complaining of any fever or chills.  Skin does not complaining of any itching or discomfort.  Head ears eyes nose mouth and throat does not complaining of any visual changes sore throat.  Respiratory denies shortness of breath nursing staff does not report any cough.  Cardiac denies any chest pain.  GI --denies abdominal pain but he has had a couple vomiting episodes apparently has had a large bowel movement last night.  \GU-she is receiving dialysis he does not really complain of dysuria he does have some urinary output.  [place] skeletal is not complaining of joint pain is status post right femur repair.  Neurologic is not complaining of dizziness or syncope numbness.  Psych does have a history of some cognitive deficits mental retardation does not appear to be overtly anxious or depressed is pleasant but speaks fairly minimally.        Immunization History  Administered Date(s) Administered  . Influenza,inj,Quad PF,36+ Mos 12/26/2013, 07/09/2015  . PPD Test 03/07/2015  . Pneumococcal Polysaccharide-23 03/04/2015   Pertinent  Health Maintenance Due  Topic Date Due  . FOOT EXAM  08/23/1958  . OPHTHALMOLOGY EXAM  08/23/1958  . URINE MICROALBUMIN  08/23/1958  . COLONOSCOPY  08/23/1998  . PNA vac Low Risk Adult (2 of 2 - PCV13) 03/03/2016  . INFLUENZA VACCINE  05/27/2016  . HEMOGLOBIN A1C  05/20/2017   No flowsheet data found. Functional Status Survey:    Vitals:   11/28/16 0823  BP: 116/63    Pulse: (!) 105  Resp: (!) 24  Temp: 98.8 F (37.1 C)  TempSrc: Oral  Pulse r was 100 on auscultation  Physical Exam   In general this is a middle-aged male in no distress lying comfortably in bed.  His skin is warm and dry he does have really appreciated shunt right upper arm which is currently wrapped.  He also has covering of his right femur status post surgery.  Eyes pupils appear reactive to light visual acuity appears grossly intact.  Oropharynx he does have some food residue on his tongue mucous membranes appear fairly moist.  Chest is clear to auscultation with somewhat  poor respiratory effort-there is no labored breathing.  He heart is regular rate and rhythm with a tachycardic at 100 without murmur gallop or rub he has some mild left lower extremity edema this is on the surgical site-pedal pulse is intact.  Abdomen is soft does not appear to be tender there are positive bowel sounds in all 4 quadrants no rebound or guarding or rigidity noted.  GU could not appreciate any suprapubic distention or drainage from the penis area.  Muscle skeletal does move all extremities 4 limited exam since he is in bed but upper extremity strength appears preserved right leg again does have the surgical changes with the dressing.  Neurologic is grossly intact speech is clear although fairly minimal no lateralizing findings.  Psych he is pleasant and cooperative with exam will answer questions but does not speak much-  Labs have been reviewed as noted below  Assessment and plan.  #1-of vomiting-apparently this has resolved he did receive Zofran with good effect-does not appear this is totally newshe is not complaining of any abdominal discomfort however apparently is not eating well and unsure if this was the case in the hospital as well.  At this point will update an abdominal x-ray also will check a chest x-ray secondary to some history of leukocytosis-also will update CBC with  differential and metabolic panel as well as amylase and lipase.  Also will check a urinalysis and culture since he does have a history of urosepsis in the past as well as obstructive uropathy at one point had a catheter.  #2-history of distal right femur fracture-again he tolerated the procedure well and appears he had postop anemia he was transfused hemoglobin on discharge was 9.4 we will update this as well continues on Epogen injections as well as iron-. He is receiving Vicodin as needed for pain-he is also on Coumadin for 30 day. Goal INR appears to be 1.5-1.7-per hospital discharge summary--- he will need an updated INR  #3 history of end-stage renal disease continues on hemodialysis 3 days a week he appears to be tolerating this fairly well.  #4 history of acute leukocytosis as noted above no clear etiology was found white count appear to be slightly down on discharge-again will update this also will check a UA C&S as well as a chest x-ray.--  #5 history diabetes type 2 e is on Tradjenta however if he is not eating much would be hesitant to continue --- blood sugars have been satisfactory in the low 100s at this point will monitor if he does not eat lunch or supper would be inclined to hold this and monitor blood sugars  #6 hypothyroidism he continues on Synthroid--TSH June 2017 was within normal range at 1.071 and appears this is been stable for an extended period of time but will warrant updating while he is here  #History of hypertension this appears to be stable on no medications recent blood pressures 110/70-108/62.--He is on Coreg 6.25 mg twice a day as well as Bidil--20/37.5  #8 history of hyperlipidemia continues on a statin as well as niacin.  #9 history of BPH with a history of obstructive uropathy-he is on Flomax.  #10 history GERD  continues on a PPI--prilosec   Again will update a CBC with differential CMP as well as PT and INR today-also obtain abdominal x-ray chest x-ray  as well as a UA C&S-he does not appear to be in any discomfort apparently O he is not eating much he did have his vomiting  episodes apparently overnight no further vomiting today but will await results of the investigational studies \\  Also monitor vital signs pulse ox every shift     -  Labs reviewed:  Recent Labs  11/24/16 0339 11/25/16 0428 11/26/16 0613  NA 136 136 136  K 5.6* 4.8 4.5  CL 100* 97* 97*  CO2 23 25 24   GLUCOSE 91 112* 88  BUN 55* 41* 64*  CREATININE 7.68* 6.47* 8.80*  CALCIUM 8.3* 8.7* 8.3*  PHOS 6.9* 6.7* 8.7*    Recent Labs  03/10/16 0155  04/03/16 0935  11/20/16 1446  11/24/16 0339 11/25/16 0428 11/26/16 0613  AST 15  --  13*  --  27  --   --   --   --   ALT 13*  --  12*  --  33  --   --   --   --   ALKPHOS 51  --  66  --  77  --   --   --   --   BILITOT 0.5  --  0.4  --  0.4  --   --   --   --   PROT 6.6  --  6.0*  --  7.1  --   --   --   --   ALBUMIN 3.6  < > 3.0*  < > 3.7  < > 2.5* 2.6* 2.3*  < > = values in this interval not displayed.  Recent Labs  03/10/16 0155  04/03/16 0935  11/20/16 1446  11/24/16 0339 11/25/16 0428 11/26/16 0700  WBC 10.3  < > 6.3  < > 8.8  < > 12.8* 13.4* 12.0*  NEUTROABS 8.5*  --  4.5  --  6.4  --   --   --   --   HGB 9.7*  < > 7.6*  < > 9.0*  < > 7.1* 10.1* 9.4*  HCT 29.5*  < > 23.2*  < > 28.6*  < > 21.8* 30.5* 29.3*  MCV 101.4*  < > 104.0*  < > 110.9*  < > 99.5 95.6 96.7  PLT 104*  < > 172  < > 176  < > 137* 148* 186  < > = values in this interval not displayed. Lab Results  Component Value Date   TSH 1.071 04/03/2016   Lab Results  Component Value Date   HGBA1C 5.0 11/20/2016   No results found for: CHOL, HDL, LDLCALC, LDLDIRECT, TRIG, CHOLHDL  Significant Diagnostic Results in last 30 days:  Dg Chest 1 View  Result Date: 11/20/2016 CLINICAL DATA:  Femur fracture. Shortness of breath. Preoperative evaluation. EXAM: CHEST 1 VIEW COMPARISON:  03/06/2016 FINDINGS: Heart size is normal.  Mediastinal shadows are normal. Right lung is clear. There is mild atelectasis at the left base. No effusion. No acute bone finding. IMPRESSION: Mild atelectasis at the left base. Electronically Signed   By: Nelson Chimes M.D.   On: 11/20/2016 15:25   Dg Knee 2 Views Right  Result Date: 11/20/2016 CLINICAL DATA:  Pain and swelling RIGHT knee EXAM: RIGHT KNEE - 1-2 VIEW COMPARISON:  None FINDINGS: Osseous demineralization. Joint spaces preserved. Comminuted fracture of the distal RIGHT femoral metaphysis with apex anterior angulation and mild posterior displacement. Intra-articular extension into the intercondylar notch. Associated soft tissue swelling and deformity. No additional fracture, dislocation, or bone destruction. IMPRESSION: Comminuted displaced and angulated fracture of the distal RIGHT femoral metaphysis with intra-articular extension into the RIGHT knee. Osseous demineralization. Electronically Signed  By: Lavonia Dana M.D.   On: 11/20/2016 13:41   Ct Knee Right Wo Contrast  Result Date: 11/20/2016 CLINICAL DATA:  Femoral fracture after fall at dialysis. EXAM: CT OF THE RIGHT KNEE WITHOUT CONTRAST TECHNIQUE: Multidetector CT imaging of the RIGHT knee was performed according to the standard protocol. Multiplanar CT image reconstructions were also generated. COMPARISON:  Radiographs from 11/20/2016 of the right knee FINDINGS: Bones/Joint/Cartilage Acute, comminuted and closed T-shaped supracondylar fracture of the right femur with sagittal fracture component extending through the intercondylar notch into the femorotibial compartment along the medial aspect of the lateral femoral condyle. The fracture involving the lateral femoral condyle causes the articular surface to be disrupted by 4 mm along the anterior weight-bearing portion of the condyles by 4 mm on the sagittal reformats (image 55,series 6) will you. Fracture also extends into the lateral patellofemoral compartment. Lipohemarthrosis is  noted. There is 1/4 shaft with displacement of the distal fracture fragment. A nondisplaced sagittally oriented posterior cortical fracture extends more proximally of the diaphysis of the femoral shaft, incompletely included that extends at least to the junction of the middle and distal third when correlated with same day radiographs. No tibial fracture. Ligaments Suboptimally assessed by CT. Muscles and Tendons Diffuse periarticular intramuscular edema without intramuscular hematoma. Soft tissues Lipohemarthrosis of the knee joint. Periarticular soft tissue edema. IMPRESSION: Acute, closed, comminuted and dorsally displaced T-shaped supracondylar fracture of the right femur extending into the femorotibial and patellofemoral compartments as described with associated lipohemarthrosis. No tibial fracture. There is a nondisplaced posterior diaphyseal fracture of the femoral shaft that extends more proximally at least to the junction of the middle and distal third. The proximal extent is not entirely included on this study of the knee. Electronically Signed   By: Ashley Royalty M.D.   On: 11/20/2016 19:27   Dg C-arm 1-60 Min  Result Date: 11/22/2016 CLINICAL DATA:  ORIF distal right femoral fracture EXAM: DG C-ARM 61-120 MIN; RIGHT KNEE - 3 VIEW COMPARISON:  11/20/2016 FINDINGS: Plate and screw fixation across the distal right femoral fracture. Near anatomic alignment. No hardware complicating feature. IMPRESSION: Internal fixation across the distal right femoral fracture. No visible complicating feature. Electronically Signed   By: Rolm Baptise M.D.   On: 11/22/2016 13:50   Dg Femur Port, Min 2 Views Right  Result Date: 11/22/2016 CLINICAL DATA:  Postop check. EXAM: RIGHT FEMUR PORTABLE 2 VIEW COMPARISON:  11/20/2016 and 11/22/2016 FINDINGS: Examination demonstrates a lateral fixation plate with screws bridging patient's distal femoral fracture with hardware intact and near anatomic alignment over the fracture  site. Skin staples are present over the lateral soft tissues. Separate orthopedic screw extending from anterior to posterior over the lateral femoral condyles intact. IMPRESSION: Post fixation of distal femoral fracture with hardware intact and near anatomic alignment about the fracture site. Electronically Signed   By: Marin Olp M.D.   On: 11/22/2016 17:00   Dg Knee 2 Views Right  Result Date: 11/22/2016 CLINICAL DATA:  ORIF distal right femoral fracture EXAM: DG C-ARM 61-120 MIN; RIGHT KNEE - 3 VIEW COMPARISON:  11/20/2016 FINDINGS: Plate and screw fixation across the distal right femoral fracture. Near anatomic alignment. No hardware complicating feature. IMPRESSION: Internal fixation across the distal right femoral fracture. No visible complicating feature. Electronically Signed   By: Rolm Baptise M.D.   On: 11/22/2016 13:50      Addendum-we have re-ceived the updated labs which appear fairly baseline  CBC shows white count of 13,700 which  appears to have been his relative baseline here the last several days-hemoglobin is stable at 9.9.  Metabolic panel shows a creatinine of 5.89 BUN of 47 which is somewhat improved again he is a dialysis patient sodiums 138 potassium 4 liver function tests are fairly unremarkable albumin is 2.9 ALT is less than 5 otherwise liver function tests within normal limits he does have a mildly elevated lipase  of 65 and amylase is somewhat elevated--189   Per nursing he actually did eat some soup this evening there's been no further vomiting-will write an order for when necessary Zofran-.  Since he is not eating much will reduce Tradjenta to-2.5 mg a day and monitor blood sugars before meals and at bedtime notify provider if CBG less than 60 or greater than 250-.  Also his INR is 2.8-goal again is 1.5-1.7-will hold his Coumadin tonight and recheck this tomorrow.  Also  will have today's labs sent with patient to dialysis tomorrow morning for review by  nephrology--   [phone] note greater than 50 minutes spent assessing patient-reviewing his chart-reviewing his labs-discussing his status with nursing staff-and coordinating and formulating a plan of care for numerous diagnoses-of note greater than 50% of time spent coordinating plan of care       Oralia Manis, [place]

## 2016-11-29 ENCOUNTER — Encounter (HOSPITAL_COMMUNITY)
Admission: RE | Admit: 2016-11-29 | Discharge: 2016-11-29 | Disposition: A | Discharge status: Home / Self Care | Payer: Medicare Other | Source: Skilled Nursing Facility | Attending: Internal Medicine | Admitting: Internal Medicine

## 2016-11-29 DIAGNOSIS — Z23 Encounter for immunization: Secondary | ICD-10-CM | POA: Diagnosis not present

## 2016-11-29 DIAGNOSIS — Z7901 Long term (current) use of anticoagulants: Secondary | ICD-10-CM | POA: Insufficient documentation

## 2016-11-29 DIAGNOSIS — N186 End stage renal disease: Secondary | ICD-10-CM | POA: Diagnosis not present

## 2016-11-29 DIAGNOSIS — D509 Iron deficiency anemia, unspecified: Secondary | ICD-10-CM | POA: Diagnosis not present

## 2016-11-29 DIAGNOSIS — N2581 Secondary hyperparathyroidism of renal origin: Secondary | ICD-10-CM | POA: Diagnosis not present

## 2016-11-29 DIAGNOSIS — D631 Anemia in chronic kidney disease: Secondary | ICD-10-CM | POA: Diagnosis not present

## 2016-11-29 DIAGNOSIS — E162 Hypoglycemia, unspecified: Secondary | ICD-10-CM | POA: Diagnosis not present

## 2016-11-29 LAB — INFLUENZA PANEL BY PCR (TYPE A & B)
Influenza A By PCR: NEGATIVE
Influenza B By PCR: NEGATIVE

## 2016-11-29 LAB — PROTIME-INR
INR: 2.85
Prothrombin Time: 30.5 seconds — ABNORMAL HIGH (ref 11.4–15.2)

## 2016-11-30 ENCOUNTER — Other Ambulatory Visit (HOSPITAL_COMMUNITY)
Admission: AD | Admit: 2016-11-30 | Discharge: 2016-11-30 | Disposition: A | Discharge status: Home / Self Care | Payer: Medicare Other | Source: Skilled Nursing Facility | Attending: Internal Medicine | Admitting: Internal Medicine

## 2016-11-30 DIAGNOSIS — X58XXXD Exposure to other specified factors, subsequent encounter: Secondary | ICD-10-CM | POA: Insufficient documentation

## 2016-11-30 DIAGNOSIS — Z7901 Long term (current) use of anticoagulants: Secondary | ICD-10-CM | POA: Insufficient documentation

## 2016-11-30 DIAGNOSIS — S72421D Displaced fracture of lateral condyle of right femur, subsequent encounter for closed fracture with routine healing: Secondary | ICD-10-CM | POA: Insufficient documentation

## 2016-11-30 DIAGNOSIS — N390 Urinary tract infection, site not specified: Secondary | ICD-10-CM | POA: Insufficient documentation

## 2016-11-30 LAB — URINALYSIS, ROUTINE W REFLEX MICROSCOPIC
Bilirubin Urine: NEGATIVE
Glucose, UA: NEGATIVE mg/dL
Ketones, ur: 5 mg/dL — AB
Nitrite: NEGATIVE
Protein, ur: 30 mg/dL — AB
Specific Gravity, Urine: 1.018 (ref 1.005–1.030)
pH: 5 (ref 5.0–8.0)

## 2016-11-30 LAB — PROTIME-INR
INR: 1.98
Prothrombin Time: 22.8 seconds — ABNORMAL HIGH (ref 11.4–15.2)

## 2016-12-01 ENCOUNTER — Encounter (HOSPITAL_COMMUNITY)
Admission: RE | Admit: 2016-12-01 | Discharge: 2016-12-01 | Disposition: A | Discharge status: Home / Self Care | Payer: Medicare Other | Source: Skilled Nursing Facility | Attending: Internal Medicine | Admitting: Internal Medicine

## 2016-12-01 ENCOUNTER — Non-Acute Institutional Stay (SKILLED_NURSING_FACILITY): Payer: Medicare Other | Admitting: Internal Medicine

## 2016-12-01 ENCOUNTER — Encounter: Payer: Self-pay | Admitting: Internal Medicine

## 2016-12-01 DIAGNOSIS — S72491A Other fracture of lower end of right femur, initial encounter for closed fracture: Secondary | ICD-10-CM | POA: Diagnosis not present

## 2016-12-01 DIAGNOSIS — N186 End stage renal disease: Secondary | ICD-10-CM

## 2016-12-01 DIAGNOSIS — I10 Essential (primary) hypertension: Secondary | ICD-10-CM

## 2016-12-01 DIAGNOSIS — E118 Type 2 diabetes mellitus with unspecified complications: Secondary | ICD-10-CM

## 2016-12-01 LAB — URINE CULTURE: Culture: <10000 — AB

## 2016-12-01 LAB — PROTIME-INR
INR: 1.52
Prothrombin Time: 18.5 seconds — ABNORMAL HIGH (ref 11.4–15.2)

## 2016-12-01 NOTE — Progress Notes (Signed)
Provider:  Veleta Miners Location:   [place] Room Number: 112/D Place of Service:  SNF (31)  PCP: [name], MD Patient Care Team: [name], MD as PCP - General (Internal Medicine) Cassandria Anger, MD as Consulting Physician (Endocrinology) Fran Lowes, MD as Consulting Physician (Nephrology)  Extended Emergency Contact Information Primary Emergency Contact: Spell,Malinda Address: DSS  [place] Phone: [phone] Work Phone: [phone] Relation: Legal Guardian Secondary Emergency Contact: Davidoff,Richard Address: [address]          [place],  [address] [place] Phone: [phone] Relation: Brother  Code Status: Full Code Goals of Care: Advanced Directive information Advanced Directives 12/01/2016  Does Patient Have a Medical Advance Directive? Yes  Type of Advance Directive (No Data)  Does patient want to make changes to medical advance directive? No - Patient declined  Copy of [place] in Chart? -  Would patient like information on creating a medical advance directive? -  Pre-existing out of facility DNR order (yellow form or pink MOST form) -      Chief Complaint  Patient presents with  . New Admit To SNF    HPI: Patient is a 71 y.o. male seen today for admission to SNF for therapy after ORIF for Right distal Femur fracture. Patient has H/O ESRD on Dialysis TTS . He also Has h/o type 2 Diabetes, HTN, Hypothyroidism and Mild Mental delay. He Lives in Assisted facility and fell. He was found to have Right distal femur fracture. He underwent ORIF on 11/22/16. His Post op course was complicated by Blood loss requiring Blood transfusion.  Patient is doing well in facility. He denies any Pain in the surgery site. Did well with therapy this morning.   Past Medical History:  Diagnosis Date  . Abnormal CT scan, kidney 10/06/2011  . Acute pyelonephritis  10/07/2011  . Anemia    normocytic  . Anxiety    mental retardation  . Bladder wall thickening 10/06/2011  . BPH (benign prostatic hypertrophy)   . Diabetes mellitus   . Edema     history of lower extremity edema  . Heme positive stool   . Hydronephrosis   . Hyperkalemia   . Hyperlipidemia   . Hypernatremia   . Hypertension   . Hypothyroidism   . Impaired speech   . 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 MR (mental retardation)   . Obstructive uropathy   . 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 Poor historian poor historian  . Protein calorie malnutrition ([place])   . Pyelonephritis   . Renal failure (ARF), acute on chronic (HCC)   . Renal insufficiency    chronic history  . Smoking   . Uremia   . Urinary retention   . UTI (lower urinary tract infection) 10/06/2011  . UTI (lower urinary tract infection)    Past Surgical History:  Procedure Laterality Date  . AV FISTULA PLACEMENT Left 07/06/2015   Procedure:  INSERTION LEFT ARM ARTERIOVENOUS GORTEX GRAFT;  Surgeon: Angelia Mould, MD;  Location: [facility];  Service: Vascular;  Laterality: Left;  . AV FISTULA PLACEMENT Right 02/26/2016   Procedure: ARTERIOVENOUS (AV) FISTULA CREATION ;  Surgeon: Angelia Mould, MD;  Location: [facility];  Service: Vascular;  Laterality: Right;  . [place] REMOVAL Left 10/09/2015   Procedure: REMOVAL OF ARTERIOVENOUS GORETEX GRAFT ([place]) Evacuation of Lymphocele, Vein Patch angioplasty of brachial artery.;  Surgeon: Angelia Mould, MD;  Location: [facility];  Service: Vascular;  Laterality: Left;  . BASCILIC VEIN TRANSPOSITION Right 02/26/2016   Procedure: Right BASCILIC VEIN TRANSPOSITION;  Surgeon: Angelia Mould, MD;  Location: [facility];  Service: Vascular;  Laterality: Right;  . CIRCUMCISION N/A 01/04/2014   Procedure: CIRCUMCISION ADULT (procedure #1);  Surgeon: Marissa Nestle, MD;  Location: [facility];  Service: Urology;  Laterality: N/A;  . CYSTOSCOPY W/ RETROGRADES Bilateral 06/29/2015     Procedure: CYSTOSCOPY, DILATION OF URETHRAL STRICTURE WITH BILATERAL RETROGRADE PYELOGRAM,SUPRAPUBIC TUBE CHANGE;  Surgeon: Festus Aloe, MD;  [facility];  Service: Urology;  Laterality: Bilateral;  . CYSTOSCOPY WITH URETHRAL DILATATION N/A 12/29/2013   Procedure: CYSTOSCOPY WITH URETHRAL DILATATION;  Surgeon: Marissa Nestle, MD;  [facility];  Service: Urology;  Laterality: N/A;  . ORIF FEMUR FRACTURE Right 11/22/2016   Procedure: OPEN REDUCTION INTERNAL FIXATION (ORIF) DISTAL FEMUR FRACTURE;  Surgeon: Rod Can, MD;  Location: [facility];  Service: Orthopedics;  Laterality: Right;  . PERIPHERAL VASCULAR CATHETERIZATION N/A 10/08/2015   Procedure: A/V Shuntogram;  Surgeon: Angelia Mould, MD;  Location: [facility];  Service: Cardiovascular;  Laterality: N/A;  . TRANSURETHRAL RESECTION OF PROSTATE N/A 01/04/2014   Procedure: TRANSURETHRAL RESECTION OF THE PROSTATE (TURP) (procedure #2);  Surgeon: Marissa Nestle, MD;  [facility];  Service: Urology;  Laterality: N/A;    reports that he has never smoked. He has never used smokeless tobacco. He reports that he does not drink alcohol or use drugs. Social History   Social History  . Marital status: Single    Spouse name: N/A  . Number of children: N/A  . Years of education: N/A   Occupational History  . Not on file.   Social History Main Topics  . Smoking status: Never Smoker  . Smokeless tobacco: Never Used  . Alcohol use No     Comment: occ. use   . Drug use: No  . Sexual activity: No   Other Topics Concern  . Not on file   Social History Narrative   Lives at assisted living facility    Functional Status Survey:    Family History  Problem Relation Age of Onset  . Cancer Mother     Health Maintenance  Topic Date Due  . FOOT EXAM  08/23/1958  . OPHTHALMOLOGY EXAM  08/23/1958  . URINE MICROALBUMIN  08/23/1958  . TETANUS/TDAP  08/24/1967  . COLONOSCOPY  08/23/1998  . ZOSTAVAX   08/23/2008  . PNA vac Low Risk Adult (2 of 2 - PCV13) 03/03/2016  . INFLUENZA VACCINE  05/27/2016  . HEMOGLOBIN A1C  05/20/2017  . Hepatitis C Screening  Completed    No Known Allergies  Current Outpatient Prescriptions on File Prior to Visit  Medication Sig Dispense Refill  . atorvastatin (LIPITOR) 40 MG tablet Take 40 mg by mouth every evening.    . carvedilol (COREG) 6.25 MG tablet Take 1 tablet (6.25 mg total) by mouth 2 (two) times daily with a meal. 60 tablet 3  . epoetin alfa (EPOGEN,PROCRIT) 4000 UNIT/ML injection Inject 4,000 Units into the vein every 14 (fourteen) days.    . ferrous sulfate 325 (65 FE) MG tablet Take 325 mg by mouth 2 (two) times daily with a meal.     . HYDROcodone-acetaminophen (NORCO/VICODIN) 5-325 MG tablet Take 1-2 tablets by mouth every 6 (six) hours as needed for moderate pain. 30 tablet 0  . isosorbide-hydrALAZINE (BIDIL) 20-37.5 MG per tablet Take 1 tablet by mouth 3 (three) times daily.  90 tablet 3  . levothyroxine (SYNTHROID, LEVOTHROID) 75 MCG tablet Take 75 mcg by mouth daily before breakfast.     . linagliptin (TRADJENTA) 5 MG TABS tablet Take 2.5 mg by mouth daily.     . Multiple Vitamin (MULTIVITAMIN WITH MINERALS) TABS tablet Take 1 tablet by mouth daily.    . Neo-Bacit-Poly-Lidocaine (FIRST AID PLUS LIDOCAINE EX) Apply 1 application topically 3 (three) times a week. Prior to dialysis    . omeprazole (PRILOSEC) 20 MG capsule Take 40 mg by mouth daily.    . tamsulosin (FLOMAX) 0.4 MG CAPS capsule Take 0.4 mg by mouth daily.    Marland Kitchen torsemide (DEMADEX) 10 MG tablet Take 5 tablets (50 mg total) by mouth daily. 30 tablet 3  . Vitamin D, Ergocalciferol, (DRISDOL) 50000 UNITS CAPS capsule Take 1 capsule (50,000 Units total) by mouth every 7 (seven) days. 30 capsule 3   No current facility-administered medications on file prior to visit.      Review of Systems  Review of Systems  Constitutional: Negative for activity change, appetite change, chills,  diaphoresis, fatigue and fever.  HENT: Negative for mouth sores, postnasal drip, rhinorrhea, sinus pain and sore throat.   Respiratory: Negative for apnea, cough, chest tightness, shortness of breath and wheezing.   Cardiovascular: Negative for chest pain, palpitations and leg swelling.  Gastrointestinal: Negative for abdominal distention, abdominal pain, constipation, diarrhea, nausea and vomiting.  Genitourinary: Negative for dysuria and frequency.  Musculoskeletal: Negative for arthralgias, joint swelling and myalgias.  Skin: Negative for rash.  Neurological: Negative for dizziness, syncope, weakness, light-headedness and numbness.  Psychiatric/Behavioral: Negative for behavioral problems, confusion and sleep disturbance.    Vitals:   12/01/16 0949  BP: 100/62  Pulse: 92  Resp: 18  Temp: 97.8 F (36.6 C)  TempSrc: Oral  SpO2: 93%   There is no height or weight on file to calculate BMI. Physical Exam  Constitutional: He appears well-developed and well-nourished.  HENT:  Head: Normocephalic.  Mouth/Throat: Oropharynx is clear and moist.  Eyes: Pupils are equal, round, and reactive to light.  Neck: Neck supple.  Cardiovascular: Normal rate, regular rhythm and normal heart sounds.   No murmur heard. Pulmonary/Chest: Effort normal and breath sounds normal. No respiratory distress. He has no wheezes. He has no rales.  Abdominal: Soft. Bowel sounds are normal. He exhibits no distension. There is no tenderness. There is no rebound.  Musculoskeletal: He exhibits no edema.  Lymphadenopathy:    He has no cervical adenopathy.  Neurological: He is alert.  Only reined to place. Says he doesn't keep up with time. Moving all extremities well.    Labs reviewed: Basic Metabolic Panel:  Recent Labs  11/24/16 0339 11/25/16 0428 11/26/16 0613 11/28/16 1400  NA 136 136 136 138  K 5.6* 4.8 4.5 4.0  CL 100* 97* 97* 97*  CO2 23 25 24 24   GLUCOSE 91 112* 88 123*  BUN 55* 41* 64* 47*    CREATININE 7.68* 6.47* 8.80* 5.89*  CALCIUM 8.3* 8.7* 8.3* 8.9  PHOS 6.9* 6.7* 8.7*  --    Liver Function Tests:  Recent Labs  04/03/16 0935  11/20/16 1446  11/25/16 0428 11/26/16 0613 11/28/16 1400  AST 13*  --  27  --   --   --  27  ALT 12*  --  33  --   --   --  <5*  ALKPHOS 66  --  77  --   --   --  68  BILITOT 0.4  --  0.4  --   --   --  0.9  PROT 6.0*  --  7.1  --   --   --  7.6  ALBUMIN 3.0*  < > 3.7  < > 2.6* 2.3* 2.9*  < > = values in this interval not displayed.  Recent Labs  03/10/16 0155 11/28/16 1400  LIPASE 116* 65*  AMYLASE  --  189*   No results for input(s): AMMONIA in the last 8760 hours. CBC:  Recent Labs  04/03/16 0935  11/20/16 1446  11/25/16 0428 11/26/16 0700 11/28/16 1400  WBC 6.3  < > 8.8  < > 13.4* 12.0* 13.7*  NEUTROABS 4.5  --  6.4  --   --   --  9.7*  HGB 7.6*  < > 9.0*  < > 10.1* 9.4* 9.9*  HCT 23.2*  < > 28.6*  < > 30.5* 29.3* 30.9*  MCV 104.0*  < > 110.9*  < > 95.6 96.7 100.0  PLT 172  < > 176  < > 148* 186 323  < > = values in this interval not displayed. Cardiac Enzymes: No results for input(s): CKTOTAL, CKMB, CKMBINDEX, TROPONINI in the last 8760 hours. BNP: Invalid input(s): POCBNP Lab Results  Component Value Date   HGBA1C 5.0 11/20/2016   Lab Results  Component Value Date   TSH 1.071 04/03/2016   Lab Results  Component Value Date   VITAMINB12 318 03/02/2015   Lab Results  Component Value Date   FOLATE 9.1 03/02/2015   Lab Results  Component Value Date   IRON <5 (L) 11/23/2016   TIBC 148 (L) 11/23/2016   FERRITIN 37 04/05/2016    Imaging and Procedures obtained prior to SNF admission: No results found.  Assessment/Plan  S/P Distal Right Femur Fracture Patient doing well with therapy. Pain controlled with Hydrocodone PRN Follow up with Ortho.  DVT Prophylaxis Patient on Coumadin with plan for INR b/w 1.5-1.7 as patient had bleeding after his Surgery.INR today was 1.5 Will restart his  coumadin  Type 2 Diabetes Patient on Oral Tradjenta Last A 1C was 5 Hypertension Patient on Low dose of Demadex, Coreg and Isosorbide.  BPH with urinary retension It seems like patient had Foley Catheter which was recently D'Ced. Will continue on Flomax and follow Urinary output.  Nausea And vomiting. Patient was c/o Nausea when initially came to facility. It is resolved now.  Hyperlipidemia Continue on Lipitor. GERD Patient stable on Prilosec Anemia Hgb Stable now. On iron supplement and Procrit. Hypothyroidism TSH was normal levels in Hospital  Family/ staff Communication:  Total time spent in this patient care encounter was 45_ minutes; greater than 50% of the visit spent counseling patient and coordinating care for problems addressed at this encounter.  Labs/tests ordered:

## 2016-12-02 ENCOUNTER — Other Ambulatory Visit: Payer: Self-pay | Admitting: Urology

## 2016-12-02 DIAGNOSIS — Z23 Encounter for immunization: Secondary | ICD-10-CM | POA: Diagnosis not present

## 2016-12-02 DIAGNOSIS — N2581 Secondary hyperparathyroidism of renal origin: Secondary | ICD-10-CM | POA: Diagnosis not present

## 2016-12-02 DIAGNOSIS — D631 Anemia in chronic kidney disease: Secondary | ICD-10-CM | POA: Diagnosis not present

## 2016-12-02 DIAGNOSIS — D509 Iron deficiency anemia, unspecified: Secondary | ICD-10-CM | POA: Diagnosis not present

## 2016-12-02 DIAGNOSIS — N186 End stage renal disease: Secondary | ICD-10-CM | POA: Diagnosis not present

## 2016-12-02 DIAGNOSIS — E162 Hypoglycemia, unspecified: Secondary | ICD-10-CM | POA: Diagnosis not present

## 2016-12-04 ENCOUNTER — Encounter (HOSPITAL_COMMUNITY)
Admission: RE | Admit: 2016-12-04 | Discharge: 2016-12-04 | Disposition: A | Discharge status: Home / Self Care | Payer: Medicare Other | Source: Skilled Nursing Facility | Attending: *Deleted | Admitting: *Deleted

## 2016-12-04 DIAGNOSIS — E162 Hypoglycemia, unspecified: Secondary | ICD-10-CM | POA: Diagnosis not present

## 2016-12-04 DIAGNOSIS — N2581 Secondary hyperparathyroidism of renal origin: Secondary | ICD-10-CM | POA: Diagnosis not present

## 2016-12-04 DIAGNOSIS — D631 Anemia in chronic kidney disease: Secondary | ICD-10-CM | POA: Diagnosis not present

## 2016-12-04 DIAGNOSIS — D509 Iron deficiency anemia, unspecified: Secondary | ICD-10-CM | POA: Diagnosis not present

## 2016-12-04 DIAGNOSIS — Z23 Encounter for immunization: Secondary | ICD-10-CM | POA: Diagnosis not present

## 2016-12-04 DIAGNOSIS — N186 End stage renal disease: Secondary | ICD-10-CM | POA: Diagnosis not present

## 2016-12-04 LAB — CBC
HCT: 24.7 % — ABNORMAL LOW (ref 39.0–52.0)
Hemoglobin: 8.1 g/dL — ABNORMAL LOW (ref 13.0–17.0)
MCH: 32 pg (ref 26.0–34.0)
MCHC: 32.8 g/dL (ref 30.0–36.0)
MCV: 97.6 fL (ref 78.0–100.0)
Platelets: 317 10*3/uL (ref 150–400)
RBC: 2.53 MIL/uL — ABNORMAL LOW (ref 4.22–5.81)
RDW: 17.4 % — ABNORMAL HIGH (ref 11.5–15.5)
WBC: 10.6 10*3/uL — ABNORMAL HIGH (ref 4.0–10.5)

## 2016-12-04 LAB — PROTIME-INR
INR: 1.15
Prothrombin Time: 14.8 seconds (ref 11.4–15.2)

## 2016-12-05 DIAGNOSIS — S72491D Other fracture of lower end of right femur, subsequent encounter for closed fracture with routine healing: Secondary | ICD-10-CM | POA: Diagnosis not present

## 2016-12-06 ENCOUNTER — Other Ambulatory Visit (HOSPITAL_COMMUNITY)
Admission: AD | Admit: 2016-12-06 | Discharge: 2016-12-06 | Disposition: A | Discharge status: Home / Self Care | Payer: Medicare Other | Source: Skilled Nursing Facility | Attending: Internal Medicine | Admitting: Internal Medicine

## 2016-12-06 DIAGNOSIS — D509 Iron deficiency anemia, unspecified: Secondary | ICD-10-CM | POA: Diagnosis not present

## 2016-12-06 DIAGNOSIS — E162 Hypoglycemia, unspecified: Secondary | ICD-10-CM | POA: Diagnosis not present

## 2016-12-06 DIAGNOSIS — D631 Anemia in chronic kidney disease: Secondary | ICD-10-CM | POA: Diagnosis not present

## 2016-12-06 DIAGNOSIS — N2581 Secondary hyperparathyroidism of renal origin: Secondary | ICD-10-CM | POA: Diagnosis not present

## 2016-12-06 DIAGNOSIS — N186 End stage renal disease: Secondary | ICD-10-CM | POA: Diagnosis not present

## 2016-12-06 DIAGNOSIS — Z23 Encounter for immunization: Secondary | ICD-10-CM | POA: Diagnosis not present

## 2016-12-06 DIAGNOSIS — I10 Essential (primary) hypertension: Secondary | ICD-10-CM | POA: Insufficient documentation

## 2016-12-06 LAB — PROTIME-INR
INR: 1.18
Prothrombin Time: 15.1 seconds (ref 11.4–15.2)

## 2016-12-07 ENCOUNTER — Other Ambulatory Visit (HOSPITAL_COMMUNITY)
Admission: AD | Admit: 2016-12-07 | Discharge: 2016-12-07 | Disposition: A | Discharge status: Home / Self Care | Payer: Medicare Other | Source: Skilled Nursing Facility | Attending: Internal Medicine | Admitting: Internal Medicine

## 2016-12-07 DIAGNOSIS — Z7901 Long term (current) use of anticoagulants: Secondary | ICD-10-CM | POA: Insufficient documentation

## 2016-12-07 LAB — PROTIME-INR
INR: 1.12
Prothrombin Time: 14.5 seconds (ref 11.4–15.2)

## 2016-12-08 ENCOUNTER — Encounter (HOSPITAL_COMMUNITY)
Admission: RE | Admit: 2016-12-08 | Discharge: 2016-12-08 | Disposition: A | Discharge status: Home / Self Care | Payer: Medicare Other | Source: Skilled Nursing Facility | Attending: Internal Medicine | Admitting: Internal Medicine

## 2016-12-08 LAB — PROTIME-INR
INR: 1.23
Prothrombin Time: 15.6 seconds — ABNORMAL HIGH (ref 11.4–15.2)

## 2016-12-09 ENCOUNTER — Encounter (HOSPITAL_COMMUNITY)
Admission: RE | Admit: 2016-12-09 | Discharge: 2016-12-09 | Disposition: A | Discharge status: Home / Self Care | Payer: No Typology Code available for payment source | Source: Skilled Nursing Facility | Attending: Internal Medicine | Admitting: Internal Medicine

## 2016-12-09 DIAGNOSIS — X58XXXD Exposure to other specified factors, subsequent encounter: Secondary | ICD-10-CM | POA: Insufficient documentation

## 2016-12-09 DIAGNOSIS — D631 Anemia in chronic kidney disease: Secondary | ICD-10-CM | POA: Diagnosis not present

## 2016-12-09 DIAGNOSIS — N2581 Secondary hyperparathyroidism of renal origin: Secondary | ICD-10-CM | POA: Diagnosis not present

## 2016-12-09 DIAGNOSIS — N401 Enlarged prostate with lower urinary tract symptoms: Secondary | ICD-10-CM | POA: Insufficient documentation

## 2016-12-09 DIAGNOSIS — S72421D Displaced fracture of lateral condyle of right femur, subsequent encounter for closed fracture with routine healing: Secondary | ICD-10-CM | POA: Insufficient documentation

## 2016-12-09 DIAGNOSIS — E162 Hypoglycemia, unspecified: Secondary | ICD-10-CM | POA: Diagnosis not present

## 2016-12-09 DIAGNOSIS — D509 Iron deficiency anemia, unspecified: Secondary | ICD-10-CM | POA: Diagnosis not present

## 2016-12-09 DIAGNOSIS — Z23 Encounter for immunization: Secondary | ICD-10-CM | POA: Diagnosis not present

## 2016-12-09 DIAGNOSIS — N186 End stage renal disease: Secondary | ICD-10-CM | POA: Diagnosis not present

## 2016-12-09 DIAGNOSIS — Z7901 Long term (current) use of anticoagulants: Secondary | ICD-10-CM | POA: Diagnosis not present

## 2016-12-09 LAB — PROTIME-INR
INR: 1.14
Prothrombin Time: 14.7 seconds (ref 11.4–15.2)

## 2016-12-11 ENCOUNTER — Non-Acute Institutional Stay (SKILLED_NURSING_FACILITY): Payer: Medicare Other | Admitting: Internal Medicine

## 2016-12-11 ENCOUNTER — Other Ambulatory Visit (HOSPITAL_COMMUNITY)
Admission: RE | Admit: 2016-12-11 | Discharge: 2016-12-11 | Disposition: A | Discharge status: Home / Self Care | Payer: No Typology Code available for payment source | Source: Ambulatory Visit | Attending: Internal Medicine | Admitting: Internal Medicine

## 2016-12-11 DIAGNOSIS — N401 Enlarged prostate with lower urinary tract symptoms: Secondary | ICD-10-CM | POA: Insufficient documentation

## 2016-12-11 DIAGNOSIS — Z7901 Long term (current) use of anticoagulants: Secondary | ICD-10-CM

## 2016-12-11 DIAGNOSIS — D509 Iron deficiency anemia, unspecified: Secondary | ICD-10-CM | POA: Diagnosis not present

## 2016-12-11 DIAGNOSIS — Z5181 Encounter for therapeutic drug level monitoring: Secondary | ICD-10-CM

## 2016-12-11 DIAGNOSIS — R8290 Unspecified abnormal findings in urine: Secondary | ICD-10-CM

## 2016-12-11 DIAGNOSIS — N186 End stage renal disease: Secondary | ICD-10-CM | POA: Diagnosis not present

## 2016-12-11 DIAGNOSIS — N2581 Secondary hyperparathyroidism of renal origin: Secondary | ICD-10-CM | POA: Diagnosis not present

## 2016-12-11 DIAGNOSIS — D631 Anemia in chronic kidney disease: Secondary | ICD-10-CM | POA: Diagnosis not present

## 2016-12-11 DIAGNOSIS — Z23 Encounter for immunization: Secondary | ICD-10-CM | POA: Diagnosis not present

## 2016-12-11 DIAGNOSIS — E162 Hypoglycemia, unspecified: Secondary | ICD-10-CM | POA: Diagnosis not present

## 2016-12-11 LAB — URINALYSIS, MICROSCOPIC (REFLEX)

## 2016-12-11 LAB — URINALYSIS, ROUTINE W REFLEX MICROSCOPIC
Bilirubin Urine: NEGATIVE
Glucose, UA: NEGATIVE mg/dL
Nitrite: NEGATIVE
Protein, ur: 100 mg/dL — AB
Specific Gravity, Urine: 1.025 (ref 1.005–1.030)
pH: 7 (ref 5.0–8.0)

## 2016-12-12 ENCOUNTER — Encounter: Payer: Self-pay | Admitting: Internal Medicine

## 2016-12-12 DIAGNOSIS — R8290 Unspecified abnormal findings in urine: Secondary | ICD-10-CM | POA: Insufficient documentation

## 2016-12-12 DIAGNOSIS — E1165 Type 2 diabetes mellitus with hyperglycemia: Secondary | ICD-10-CM | POA: Diagnosis not present

## 2016-12-12 NOTE — Progress Notes (Signed)
This is an acute visit.  Level care skilled.  Facility is [facility].  Chief complaint acute visit secondary to foul-smelling urine.  History of present illness.  Patient is a 71 year old [name] seen today for nursing concerns of foul-smelling urine.  He is here for rehabilitation after sustaining a right distal femur fracture that was surgically repaired.  He also has a history of end-stage renal disease and is on dialysis.  Also has a history of type 2 diabetes as well as hypertension and mild mental delay.  Last couple days nursing staff has noted some foul-smelling urine apparently has somewhat of a milky appearance. He is not really complaining of any fever chills burning suprapubic pain or abdominal discomfort--per chart review I do see has a history of urinary retention in the past and at one point had had a Foley catheter  He does continue on Flomax    Of note he is on Coumadin for D--VT prophylaxis INR has been subtherapeutic was 1.14--his Coumadin has been increased to 1-1/2 alternating with 2 mg a day update INR is pending for next week  Past Medical History:  Diagnosis Date  . Abnormal CT scan, kidney 10/06/2011  . Acute pyelonephritis 10/07/2011  . Anemia    normocytic  . Anxiety    mental retardation  . Bladder wall thickening 10/06/2011  . BPH (benign prostatic hypertrophy)   . Diabetes mellitus   . Edema     history of lower extremity edema  . Heme positive stool   . Hydronephrosis   . Hyperkalemia   . Hyperlipidemia   . Hypernatremia   . Hypertension   . Hypothyroidism   . Impaired speech   . 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 MR (mental retardation)   . Obstructive uropathy   . 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 Poor historian poor historian  . Protein calorie malnutrition ([place])   . Pyelonephritis   . Renal failure (ARF), acute on chronic (HCC)   . Renal insufficiency    chronic history  . Smoking   .  Uremia   . Urinary retention   . UTI (lower urinary tract infection) 10/06/2011  . UTI (lower urinary tract infection)         Past Surgical History:  Procedure Laterality Date  . AV FISTULA PLACEMENT Left 07/06/2015   Procedure:  INSERTION LEFT ARM ARTERIOVENOUS GORTEX GRAFT;  Surgeon: Angelia Mould, MD;  Location: [facility];  Service: Vascular;  Laterality: Left;  . AV FISTULA PLACEMENT Right 02/26/2016   Procedure: ARTERIOVENOUS (AV) FISTULA CREATION ;  Surgeon: Angelia Mould, MD;  Location: [facility];  Service: Vascular;  Laterality: Right;  . [place] REMOVAL Left 10/09/2015   Procedure: REMOVAL OF ARTERIOVENOUS GORETEX GRAFT ([place]) Evacuation of Lymphocele, Vein Patch angioplasty of brachial artery.;  Surgeon: Angelia Mould, MD;  Location: [facility];  Service: Vascular;  Laterality: Left;  . BASCILIC VEIN TRANSPOSITION Right 02/26/2016   Procedure: Right BASCILIC VEIN TRANSPOSITION;  Surgeon: Angelia Mould, MD;  Location: [facility];  Service: Vascular;  Laterality: Right;  . CIRCUMCISION N/A 01/04/2014   Procedure: CIRCUMCISION ADULT (procedure #1);  Surgeon: Marissa Nestle, MD;  [facility];  Service: Urology;  Laterality: N/A;  . CYSTOSCOPY W/ RETROGRADES Bilateral 06/29/2015   Procedure: CYSTOSCOPY, DILATION OF URETHRAL STRICTURE WITH BILATERAL RETROGRADE PYELOGRAM,SUPRAPUBIC TUBE CHANGE;  Surgeon: Festus Aloe, MD;  [facility];  Service: Urology;  Laterality: Bilateral;  . CYSTOSCOPY WITH URETHRAL DILATATION N/A 12/29/2013  Procedure: CYSTOSCOPY WITH URETHRAL DILATATION;  Surgeon: Marissa Nestle, MD;  [facility];  Service: Urology;  Laterality: N/A;  . ORIF FEMUR FRACTURE Right 11/22/2016   Procedure: OPEN REDUCTION INTERNAL FIXATION (ORIF) DISTAL FEMUR FRACTURE;  Surgeon: Rod Can, MD;  Location: [facility];  Service: Orthopedics;  Laterality: Right;  . PERIPHERAL VASCULAR CATHETERIZATION N/A 10/08/2015   Procedure: A/V Shuntogram;   Surgeon: Angelia Mould, MD;  Location: [facility];  Service: Cardiovascular;  Laterality: N/A;  . TRANSURETHRAL RESECTION OF PROSTATE N/A 01/04/2014   Procedure: TRANSURETHRAL RESECTION OF THE PROSTATE (TURP) (procedure #2);  Surgeon: Marissa Nestle, MD;  [facility];  Service: Urology;  Laterality: N/A;    reports that he has never smoked. He has never used smokeless tobacco. He reports that he does not drink alcohol or use drugs. Social History        Social History  . Marital status: Single    Spouse name: N/A  . Number of children: N/A  . Years of education: N/A      Occupational History  . Not on file.         Social History Main Topics  . Smoking status: Never Smoker  . Smokeless tobacco: Never Used  . Alcohol use No     Comment: occ. use   . Drug use: No  . Sexual activity: No       Other Topics Concern  . Not on file      Social History Narrative   Lives at assisted living facility    Functional Status Survey:       Family History  Problem Relation Age of Onset  . Cancer Mother         Health Maintenance  Topic Date Due  . FOOT EXAM  08/23/1958  . OPHTHALMOLOGY EXAM  08/23/1958  . URINE MICROALBUMIN  08/23/1958  . TETANUS/TDAP  08/24/1967  . COLONOSCOPY  08/23/1998  . ZOSTAVAX  08/23/2008  . PNA vac Low Risk Adult (2 of 2 - PCV13) 03/03/2016  . INFLUENZA VACCINE  05/27/2016  . HEMOGLOBIN A1C  05/20/2017  . Hepatitis C Screening  Completed    No Known Allergies        Current Outpatient Prescriptions on File Prior to Visit  Medication Sig Dispense Refill  . atorvastatin (LIPITOR) 40 MG tablet Take 40 mg by mouth every evening.    . carvedilol (COREG) Zachary.25 MG tablet Take 1 tablet (Zachary.25 mg total) by mouth 2 (two) times daily with a meal. 60 tablet 3  . epoetin alfa (EPOGEN,PROCRIT) 4000 UNIT/ML injection Inject 4,000 Units into the vein every 14 (fourteen) days.    . ferrous sulfate 325 (65 FE) MG  tablet Take 325 mg by mouth 2 (two) times daily with a meal.     . HYDROcodone-acetaminophen (NORCO/VICODIN) 5-325 MG tablet Take 1-2 tablets by mouth every Zach[place] (six) hours as needed for moderate pain. 30 tablet 0  . isosorbide-hydrALAZINE (BIDIL) 20-37.5 MG per tablet Take 1 tablet by mouth 3 (three) times daily. 90 tablet 3  . levothyroxine (SYNTHROID, LEVOTHROID) 75 MCG tablet Take 75 mcg by mouth daily before breakfast.     . linagliptin (TRADJENTA) 5 MG TABS tablet Take 2.5 mg by mouth daily.     . Multiple Vitamin (MULTIVITAMIN WITH MINERALS) TABS tablet Take 1 tablet by mouth daily.    . Neo-Bacit-Poly-Lidocaine (FIRST AID PLUS LIDOCAINE EX) Apply 1 application topically 3 (three) times a week. Prior to dialysis    .  omeprazole (PRILOSEC) 20 MG capsule Take 40 mg by mouth daily.    . tamsulosin (FLOMAX) 0.4 MG CAPS capsule Take 0.4 mg by mouth daily.    Marland Kitchen torsemide (DEMADEX) 10 MG tablet Take 5 tablets (50 mg total) by mouth daily. 30 tablet 3  . Vitamin D, Ergocalciferol, (DRISDOL) 50000 UNITS CAPS capsule Take 1 capsule (50,000 Units total) by mouth every 7 (seven) days. 30 capsule 3  Of note he is on Coumadin alternating doses of 2-in 1.5 mg a day        Review of Systems  Constitutional: Negative for activity change, appetite change, chills, diaphoresis, fatigue and fever.  HENT: Negative for mouth sores, postnasal drip, rhinorrhea, sinus pain and sore throat.   Respiratory: Negative for apnea, cough, chest tightness, shortness of breath and wheezing.   Cardiovascular: Negative for chest pain, palpitations and leg swelling.  Gastrointestinal: Negative for abdominal distention, abdominal pain, constipation, diarrhea, nausea and vomiting.  Genitourinary: Negative for dysuria and frequency. Nursing has noted foul-smelling urine with a milky appearance apparently  Musculoskeletal: Negative for arthralgias, joint swelling and myalgias.  Skin: Negative for rash.    Neurological: Negative for dizziness, syncope, weakness, light-headedness and numbness.  Psychiatric/Behavioral: Negative for behavioral problems, confusion and sleep disturbance.       Vitals:  Temperature is pending pulse 86 respirations 16 blood pressure 108/56                           . Physical Exam  Constitutional: He appears well-developed and well-nourished. Lying comfortably in bed  HENT:  Head: Normocephalic.  Mouth/Throat: Oropharynx is clear and moist.  Eyes: Pupils are equal, round, and reactive to light.  Neck: Neck supple.  Cardiovascular: Normal rate, regular rhythm and normal heart sounds.   No murmur heard. Pulmonary/Chest: Effort normal and breath sounds normal. No respiratory distress. He has no wheezes. He has no rales.  Abdominal: Soft. Bowel sounds are normal. He exhibits no distension. There is no tenderness. There is no rebound.   GU could not really appreciate any suprapubic distention no drainage from the penis area I do not note any swelling erythema rashes or signs of discomfort with palpation  Musculoskeletal: He exhibits no edema.  Lymphadenopathy:    He has no cervical adenopathy.  Neurological: He is alert.   he is pleasant does follow simple verbal commands without difficulty Labs.  10/08/2017--INR is 1.14.  12/04/2016.  CBC 10.Zachary hemoglobin 8.1 platelets 317.  11/28/2016.  Sodium 138 potassium 4 BUN 47 creatinine 5.89.  Assessment and plan.  #1-foul-smelling urine-will obtain a urinalysis and culture-monitor vital signs every shift for 48 hours to keep eye on any possible fever or change in vital signs or changes status  #2 anticoagulation management INR is subtherapeutic goal is 1.5-medication changes made as above updated INR is pending for next week I do not see any increased bruising or bleeding.   [id number]

## 2016-12-13 DIAGNOSIS — N186 End stage renal disease: Secondary | ICD-10-CM | POA: Diagnosis not present

## 2016-12-13 DIAGNOSIS — N2581 Secondary hyperparathyroidism of renal origin: Secondary | ICD-10-CM | POA: Diagnosis not present

## 2016-12-13 DIAGNOSIS — E162 Hypoglycemia, unspecified: Secondary | ICD-10-CM | POA: Diagnosis not present

## 2016-12-13 DIAGNOSIS — Z23 Encounter for immunization: Secondary | ICD-10-CM | POA: Diagnosis not present

## 2016-12-13 DIAGNOSIS — D631 Anemia in chronic kidney disease: Secondary | ICD-10-CM | POA: Diagnosis not present

## 2016-12-13 DIAGNOSIS — D509 Iron deficiency anemia, unspecified: Secondary | ICD-10-CM | POA: Diagnosis not present

## 2016-12-14 LAB — URINE CULTURE: Culture: >=100000 — AB

## 2016-12-15 DIAGNOSIS — S72491D Other fracture of lower end of right femur, subsequent encounter for closed fracture with routine healing: Secondary | ICD-10-CM | POA: Diagnosis not present

## 2016-12-16 ENCOUNTER — Encounter (HOSPITAL_COMMUNITY)
Admission: RE | Admit: 2016-12-16 | Discharge: 2016-12-16 | Disposition: A | Discharge status: Home / Self Care | Payer: No Typology Code available for payment source | Source: Skilled Nursing Facility | Attending: Internal Medicine | Admitting: Internal Medicine

## 2016-12-16 DIAGNOSIS — N401 Enlarged prostate with lower urinary tract symptoms: Secondary | ICD-10-CM | POA: Diagnosis not present

## 2016-12-16 DIAGNOSIS — D509 Iron deficiency anemia, unspecified: Secondary | ICD-10-CM | POA: Diagnosis not present

## 2016-12-16 DIAGNOSIS — S72421D Displaced fracture of lateral condyle of right femur, subsequent encounter for closed fracture with routine healing: Secondary | ICD-10-CM | POA: Diagnosis not present

## 2016-12-16 DIAGNOSIS — Z23 Encounter for immunization: Secondary | ICD-10-CM | POA: Diagnosis not present

## 2016-12-16 DIAGNOSIS — Z7901 Long term (current) use of anticoagulants: Secondary | ICD-10-CM | POA: Diagnosis not present

## 2016-12-16 DIAGNOSIS — E162 Hypoglycemia, unspecified: Secondary | ICD-10-CM | POA: Diagnosis not present

## 2016-12-16 DIAGNOSIS — N2581 Secondary hyperparathyroidism of renal origin: Secondary | ICD-10-CM | POA: Diagnosis not present

## 2016-12-16 DIAGNOSIS — N186 End stage renal disease: Secondary | ICD-10-CM | POA: Diagnosis not present

## 2016-12-16 DIAGNOSIS — X58XXXD Exposure to other specified factors, subsequent encounter: Secondary | ICD-10-CM | POA: Insufficient documentation

## 2016-12-16 DIAGNOSIS — D631 Anemia in chronic kidney disease: Secondary | ICD-10-CM | POA: Diagnosis not present

## 2016-12-16 LAB — PROTIME-INR
INR: 1.2
Prothrombin Time: 15.3 seconds — ABNORMAL HIGH (ref 11.4–15.2)

## 2016-12-18 DIAGNOSIS — N186 End stage renal disease: Secondary | ICD-10-CM | POA: Diagnosis not present

## 2016-12-18 DIAGNOSIS — D631 Anemia in chronic kidney disease: Secondary | ICD-10-CM | POA: Diagnosis not present

## 2016-12-18 DIAGNOSIS — D509 Iron deficiency anemia, unspecified: Secondary | ICD-10-CM | POA: Diagnosis not present

## 2016-12-18 DIAGNOSIS — Z23 Encounter for immunization: Secondary | ICD-10-CM | POA: Diagnosis not present

## 2016-12-18 DIAGNOSIS — E162 Hypoglycemia, unspecified: Secondary | ICD-10-CM | POA: Diagnosis not present

## 2016-12-18 DIAGNOSIS — N2581 Secondary hyperparathyroidism of renal origin: Secondary | ICD-10-CM | POA: Diagnosis not present

## 2016-12-19 ENCOUNTER — Encounter: Payer: Self-pay | Admitting: Internal Medicine

## 2016-12-19 ENCOUNTER — Non-Acute Institutional Stay (SKILLED_NURSING_FACILITY): Payer: Medicare Other | Admitting: Internal Medicine

## 2016-12-19 DIAGNOSIS — Z7901 Long term (current) use of anticoagulants: Secondary | ICD-10-CM

## 2016-12-19 DIAGNOSIS — N390 Urinary tract infection, site not specified: Secondary | ICD-10-CM | POA: Diagnosis not present

## 2016-12-19 DIAGNOSIS — I959 Hypotension, unspecified: Secondary | ICD-10-CM

## 2016-12-19 NOTE — Progress Notes (Signed)
Location:   [facility] Room Number: 112/D Place of Service:  SNF (31) Provider:  Glennon Hamilton, MD  Patient Care Team: [name], MD as PCP - General (Internal Medicine) Cassandria Anger, MD as Consulting Physician (Endocrinology) Fran Lowes, MD as Consulting Physician (Nephrology)  Extended Emergency Contact Information Primary Emergency Contact: Spell,Malinda Address: DSS  [place] Phone: [phone] Work Phone: [phone] Relation: Legal Guardian Secondary Emergency Contact: Rogala,Richard Address: [address]          [place], [place] [address] [place] Phone: [phone] Relation: Brother  Code Status:  Full Code Goals of care: Advanced Directive information Advanced Directives 12/19/2016  Does Patient Have a Medical Advance Directive? Yes  Type of Advance Directive (No Data)  Does patient want to make changes to medical advance directive? No - Patient declined  Copy of [place] in Chart? -  Would patient like information on creating a medical advance directive? -  Pre-existing out of facility DNR order (yellow form or pink MOST form) -     Chief Complaint  Patient presents with  . Acute Visit  Follow-up low blood pressure readings -- UTI  HPI:  Pt is a 71 y.o. male seen today for an acute visit for follow-up of UTI as well as low blood pressure readings  Patient is here for rehabilitation after sustaining a right femur fracture that was surgically repaired he is finishing course of Coumadin for DVT prophylaxis INR was 1.2 on 12/16/2016 it appears he is supposed be on Coumadin through this weekend to complete 30 days for DVT prophylaxis.  He does have end-stage renal disease and is on chronic hemodialysis  He also is a type II diabetic as well as a history of hypertension BPH with urinary retention hyperlipidemia GERD and anemia.  He had very.  Fell smiling looking urine recently and a culture was done which did grow out greater than 100,000 colonies of Klebsiella pneumonia he has been started on Rocephin-.  He appears to be doing well with this does not currently appear to be symptomatic he has been afebrile does not complain of any back pain fever or chills.  Have noted his blood pressures tend to run somewhat low with systolics at times listed in the 80s and 90s although he does not appear to be overtly symptomatic.  Manual blood pressure reading today was 96/50-he does receive BiDil-3 times a day but this apparently is held great majority of time.  He also is on Coreg 6.25 mg twice a day.  His pulses appear to run from the 80s to the 100s with frequent readings in the 90s.  Currently he is sitting in his wheelchair comfortably does not complaining of dizziness headache or syncope no chest pain fever or chills or back pain.       Past Medical History:  Diagnosis Date  . Abnormal CT scan, kidney 10/06/2011  . Acute pyelonephritis 10/07/2011  . Anemia    normocytic  . Anxiety    mental retardation  . Bladder wall thickening 10/06/2011  . BPH (benign prostatic hypertrophy)   . Diabetes mellitus   . Edema     history of lower extremity edema  . Heme positive stool   . Hydronephrosis   . Hyperkalemia   . Hyperlipidemia   . Hypernatremia   . Hypertension   . Hypothyroidism   . Impaired speech   . 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  MR (mental retardation)   . Obstructive uropathy   . 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 Poor historian poor historian  . Protein calorie malnutrition ([place])   . Pyelonephritis   . Renal failure (ARF), acute on chronic (HCC)   . Renal insufficiency    chronic history  . Smoking   . Uremia   . Urinary retention   . UTI (lower urinary tract infection) 10/06/2011  . UTI (lower urinary tract infection)    Past Surgical History:  Procedure Laterality Date  . AV FISTULA  PLACEMENT Left 07/06/2015   Procedure:  INSERTION LEFT ARM ARTERIOVENOUS GORTEX GRAFT;  Surgeon: Angelia Mould, MD;  Location: [facility];  Service: Vascular;  Laterality: Left;  . AV FISTULA PLACEMENT Right 02/26/2016   Procedure: ARTERIOVENOUS (AV) FISTULA CREATION ;  Surgeon: Angelia Mould, MD;  Location: [facility];  Service: Vascular;  Laterality: Right;  . [place] REMOVAL Left 10/09/2015   Procedure: REMOVAL OF ARTERIOVENOUS GORETEX GRAFT ([place]) Evacuation of Lymphocele, Vein Patch angioplasty of brachial artery.;  Surgeon: Angelia Mould, MD;  Location: [facility];  Service: Vascular;  Laterality: Left;  . BASCILIC VEIN TRANSPOSITION Right 02/26/2016   Procedure: Right BASCILIC VEIN TRANSPOSITION;  Surgeon: Angelia Mould, MD;  Location: [facility];  Service: Vascular;  Laterality: Right;  . CIRCUMCISION N/A 01/04/2014   Procedure: CIRCUMCISION ADULT (procedure #1);  Surgeon: Marissa Nestle, MD;  Location: [facility];  Service: Urology;  Laterality: N/A;  . CYSTOSCOPY W/ RETROGRADES Bilateral 06/29/2015   Procedure: CYSTOSCOPY, DILATION OF URETHRAL STRICTURE WITH BILATERAL RETROGRADE PYELOGRAM,SUPRAPUBIC TUBE CHANGE;  Surgeon: Festus Aloe, MD;  Location: [facility];  Service: Urology;  Laterality: Bilateral;  . CYSTOSCOPY WITH URETHRAL DILATATION N/A 12/29/2013   Procedure: CYSTOSCOPY WITH URETHRAL DILATATION;  Surgeon: Marissa Nestle, MD;  Location: [facility];  Service: Urology;  Laterality: N/A;  . ORIF FEMUR FRACTURE Right 11/22/2016   Procedure: OPEN REDUCTION INTERNAL FIXATION (ORIF) DISTAL FEMUR FRACTURE;  Surgeon: Rod Can, MD;  Location: [facility];  Service: Orthopedics;  Laterality: Right;  . PERIPHERAL VASCULAR CATHETERIZATION N/A 10/08/2015   Procedure: A/V Shuntogram;  Surgeon: Angelia Mould, MD;  Location: [facility];  Service: Cardiovascular;  Laterality: N/A;  . TRANSURETHRAL RESECTION OF PROSTATE N/A 01/04/2014   Procedure: TRANSURETHRAL RESECTION OF THE PROSTATE  (TURP) (procedure #2);  Surgeon: Marissa Nestle, MD;  Location: [facility];  Service: Urology;  Laterality: N/A;    No Known Allergies  Current Outpatient Prescriptions on File Prior to Visit  Medication Sig Dispense Refill  . atorvastatin (LIPITOR) 40 MG tablet Take 40 mg by mouth every evening.    . carvedilol (COREG) 6.25 MG tablet Take 1 tablet (6.25 mg total) by mouth 2 (two) times daily with a meal. 60 tablet 3  . epoetin alfa (EPOGEN,PROCRIT) 4000 UNIT/ML injection Inject 4,000 Units into the vein every 14 (fourteen) days.    . ferrous sulfate 325 (65 FE) MG tablet Take 325 mg by mouth 2 (two) times daily with a meal.     . HYDROcodone-acetaminophen (NORCO/VICODIN) 5-325 MG tablet Take 1-2 tablets by mouth every 6 (six) hours as needed for moderate pain. 30 tablet 0  . isosorbide-hydrALAZINE (BIDIL) 20-37.5 MG per tablet Take 1 tablet by mouth 3 (three) times daily. 90 tablet 3  . levothyroxine (SYNTHROID, LEVOTHROID) 75 MCG tablet Take 75 mcg by mouth daily before breakfast.     . linagliptin (TRADJENTA) 5 MG TABS tablet Take 2.5 mg by mouth daily.     Marland Kitchen  Multiple Vitamin (MULTIVITAMIN WITH MINERALS) TABS tablet Take 1 tablet by mouth daily.    . Neo-Bacit-Poly-Lidocaine (FIRST AID PLUS LIDOCAINE EX) Apply 1 application topically 3 (three) times a week. Prior to dialysis    . omeprazole (PRILOSEC) 20 MG capsule Take 40 mg by mouth daily.    . ondansetron (ZOFRAN) 4 MG tablet Take 4 mg by mouth every 8 (eight) hours as needed for nausea or vomiting.    . tamsulosin (FLOMAX) 0.4 MG CAPS capsule Take 0.4 mg by mouth daily.    Marland Kitchen torsemide (DEMADEX) 10 MG tablet Take 5 tablets (50 mg total) by mouth daily. 30 tablet 3  . Vitamin D, Ergocalciferol, (DRISDOL) 50000 UNITS CAPS capsule Take 1 capsule (50,000 Units total) by mouth every 7 (seven) days. 30 capsule 3   No current facility-administered medications on file prior to visit.     Review of Systems  Constitutional: Negative for  activity change, appetite change, chills, diaphoresis, fatigue and fever.  HENT: Negative for mouth sores, postnasal drip, rhinorrhea, sinus pain and sore throat.   Respiratory: Negative for apnea, cough, chest tightness, shortness of breath and wheezing.   Cardiovascular: Negative for chest pain, palpitations and leg swelling.  Gastrointestinal: Negative for abdominal distention, abdominal pain, constipation, diarrhea, nausea and vomiting.  Genitourinary: Negative for dysuria and frequency. Is being treated for UTI Musculoskeletal: Negative for arthralgias, joint swelling and myalgias.  Skin: Negative for rash.  Neurological: Negative for dizziness, syncope, weakness, light-headedness and numbness.  Psychiatric/Behavioral: Negative for behavioral problems, confusion and sleep disturbance.     Immunization History  Administered Date(s) Administered  . Influenza,inj,Quad PF,36+ Mos 12/26/2013, 07/09/2015  . PPD Test 03/07/2015  . Pneumococcal Polysaccharide-23 03/04/2015   Pertinent  Health Maintenance Due  Topic Date Due  . FOOT EXAM  08/23/1958  . OPHTHALMOLOGY EXAM  08/23/1958  . URINE MICROALBUMIN  08/23/1958  . COLONOSCOPY  08/23/1998  . PNA vac Low Risk Adult (2 of 2 - PCV13) 03/03/2016  . INFLUENZA VACCINE  05/27/2016  . HEMOGLOBIN A1C  05/20/2017   No flowsheet data found. Functional Status Survey:    Vitals:   12/19/16 1140  BP: (!) 87/52  Pulse: 91  Resp: 20  Temp: 98.7 F (37.1 C)  TempSrc: Oral  SpO2: 98%  Of note manual blood pressure was 96/50  Physical Exam  In general this is a middle-aged male sitting comfortably in his wheelchair  His skin is warm and dry he does have really appreciated shunt right upper arm      Eyes pupils appear reactive to light visual acuity appears grossly intact.  Oropharynx Is clear mucous membranes moist  Chest is clear to auscultation with somewhat poor respiratory effort-there is no labored breathing.  He  heart is regular rate and rhythm with apical pulse in the 90s  Abdomen is soft does not appear to be tender there are positive bowel sounds in all 4 quadrants no rebound or guarding or rigidity noted.  GU could not appreciate any suprapubic distention o   Muscle skeletal does move all extremities 4--ambulatory in wheelchair .  Neurologic is grossly intact speech is clear   Psych he is pleasant and cooperative with exam will answer questions speaking a bit more than wne I-evaluated him when he came to the facility Labs reviewed:  Recent Labs  11/24/16 0339 11/25/16 0428 11/26/16 0613 11/28/16 1400  NA 136 136 136 138  K 5.6* 4.8 4.5 4.0  CL 100* 97* 97* 97*  CO2 23  25 24 24   GLUCOSE 91 112* 88 123*  BUN 55* 41* 64* 47*  CREATININE 7.68* 6.47* 8.80* 5.89*  CALCIUM 8.3* 8.7* 8.3* 8.9  PHOS 6.9* 6.7* 8.7*  --     Recent Labs  04/03/16 0935  11/20/16 1446  11/25/16 0428 11/26/16 0613 11/28/16 1400  AST 13*  --  27  --   --   --  27  ALT 12*  --  33  --   --   --  <5*  ALKPHOS 66  --  77  --   --   --  68  BILITOT 0.4  --  0.4  --   --   --  0.9  PROT 6.0*  --  7.1  --   --   --  7.6  ALBUMIN 3.0*  < > 3.7  < > 2.6* 2.3* 2.9*  < > = values in this interval not displayed.  Recent Labs  04/03/16 0935  11/20/16 1446  11/26/16 0700 11/28/16 1400 12/04/16 0700  WBC 6.3  < > 8.8  < > 12.0* 13.7* 10.6*  NEUTROABS 4.5  --  6.4  --   --  9.7*  --   HGB 7.6*  < > 9.0*  < > 9.4* 9.9* 8.1*  HCT 23.2*  < > 28.6*  < > 29.3* 30.9* 24.7*  MCV 104.0*  < > 110.9*  < > 96.7 100.0 97.6  PLT 172  < > 176  < > 186 323 317  < > = values in this interval not displayed. Lab Results  Component Value Date   TSH 1.071 04/03/2016   Lab Results  Component Value Date   HGBA1C 5.0 11/20/2016   No results found for: CHOL, HDL, LDLCALC, LDLDIRECT, TRIG, CHOLHDL  Significant Diagnostic Results in last 30 days:  Dg Chest 1 View  Result Date: 11/20/2016 CLINICAL DATA:  Femur  fracture. Shortness of breath. Preoperative evaluation. EXAM: CHEST 1 VIEW COMPARISON:  03/06/2016 FINDINGS: Heart size is normal. Mediastinal shadows are normal. Right lung is clear. There is mild atelectasis at the left base. No effusion. No acute bone finding. IMPRESSION: Mild atelectasis at the left base. Electronically Signed   By: Nelson Chimes M.D.   On: 11/20/2016 15:25   Dg Knee 2 Views Right  Result Date: 11/20/2016 CLINICAL DATA:  Pain and swelling RIGHT knee EXAM: RIGHT KNEE - 1-2 VIEW COMPARISON:  None FINDINGS: Osseous demineralization. Joint spaces preserved. Comminuted fracture of the distal RIGHT femoral metaphysis with apex anterior angulation and mild posterior displacement. Intra-articular extension into the intercondylar notch. Associated soft tissue swelling and deformity. No additional fracture, dislocation, or bone destruction. IMPRESSION: Comminuted displaced and angulated fracture of the distal RIGHT femoral metaphysis with intra-articular extension into the RIGHT knee. Osseous demineralization. Electronically Signed   By: Lavonia Dana M.D.   On: 11/20/2016 13:41   Ct Knee Right Wo Contrast  Result Date: 11/20/2016 CLINICAL DATA:  Femoral fracture after fall at dialysis. EXAM: CT OF THE RIGHT KNEE WITHOUT CONTRAST TECHNIQUE: Multidetector CT imaging of the RIGHT knee was performed according to the standard protocol. Multiplanar CT image reconstructions were also generated. COMPARISON:  Radiographs from 11/20/2016 of the right knee FINDINGS: Bones/Joint/Cartilage Acute, comminuted and closed T-shaped supracondylar fracture of the right femur with sagittal fracture component extending through the intercondylar notch into the femorotibial compartment along the medial aspect of the lateral femoral condyle. The fracture involving the lateral femoral condyle causes the articular surface to be disrupted  by 4 mm along the anterior weight-bearing portion of the condyles by 4 mm on the sagittal  reformats (image 55,series 6) will you. Fracture also extends into the lateral patellofemoral compartment. Lipohemarthrosis is noted. There is 1/4 shaft with displacement of the distal fracture fragment. A nondisplaced sagittally oriented posterior cortical fracture extends more proximally of the diaphysis of the femoral shaft, incompletely included that extends at least to the junction of the middle and distal third when correlated with same day radiographs. No tibial fracture. Ligaments Suboptimally assessed by CT. Muscles and Tendons Diffuse periarticular intramuscular edema without intramuscular hematoma. Soft tissues Lipohemarthrosis of the knee joint. Periarticular soft tissue edema. IMPRESSION: Acute, closed, comminuted and dorsally displaced T-shaped supracondylar fracture of the right femur extending into the femorotibial and patellofemoral compartments as described with associated lipohemarthrosis. No tibial fracture. There is a nondisplaced posterior diaphyseal fracture of the femoral shaft that extends more proximally at least to the junction of the middle and distal third. The proximal extent is not entirely included on this study of the knee. Electronically Signed   By: Ashley Royalty M.D.   On: 11/20/2016 19:27   Dg C-arm 1-60 Min  Result Date: 11/22/2016 CLINICAL DATA:  ORIF distal right femoral fracture EXAM: DG C-ARM 61-120 MIN; RIGHT KNEE - 3 VIEW COMPARISON:  11/20/2016 FINDINGS: Plate and screw fixation across the distal right femoral fracture. Near anatomic alignment. No hardware complicating feature. IMPRESSION: Internal fixation across the distal right femoral fracture. No visible complicating feature. Electronically Signed   By: Rolm Baptise M.D.   On: 11/22/2016 13:50   Dg Femur Port, Min 2 Views Right  Result Date: 11/22/2016 CLINICAL DATA:  Postop check. EXAM: RIGHT FEMUR PORTABLE 2 VIEW COMPARISON:  11/20/2016 and 11/22/2016 FINDINGS: Examination demonstrates a lateral fixation  plate with screws bridging patient's distal femoral fracture with hardware intact and near anatomic alignment over the fracture site. Skin staples are present over the lateral soft tissues. Separate orthopedic screw extending from anterior to posterior over the lateral femoral condyles intact. IMPRESSION: Post fixation of distal femoral fracture with hardware intact and near anatomic alignment about the fracture site. Electronically Signed   By: Marin Olp M.D.   On: 11/22/2016 17:00   Dg Knee 2 Views Right  Result Date: 11/22/2016 CLINICAL DATA:  ORIF distal right femoral fracture EXAM: DG C-ARM 61-120 MIN; RIGHT KNEE - 3 VIEW COMPARISON:  11/20/2016 FINDINGS: Plate and screw fixation across the distal right femoral fracture. Near anatomic alignment. No hardware complicating feature. IMPRESSION: Internal fixation across the distal right femoral fracture. No visible complicating feature. Electronically Signed   By: Rolm Baptise M.D.   On: 11/22/2016 13:50    Assessment/Plan #1 UTI-she is finishing a course of Rocephin this appears to be stable he is afebrile does not complain of any dysuria or back pain fever or chills.  I know since he is on an antibiotic will check an INR tomorrow to ensure  stability again he only has a minimal amount of time left on Coumadin but would like to make sure it is not supratherapeutic -- goal is 1.5 with some history of GI bleeding in the past   2-hypertension-he does not really appear to be symptomatic-will discontinue the BiDill--since he is asymptomatic would be hesitant to decrease the Coreg secondary to his history what appears a borderline tachycardia and his relative stability at this point will monitor-this was discussed with Dr. Mariea Clonts via phone.  [id number]       Lolita Cram,  Janett Billow, [place]

## 2016-12-20 ENCOUNTER — Encounter (HOSPITAL_COMMUNITY)
Admission: RE | Admit: 2016-12-20 | Discharge: 2016-12-20 | Disposition: A | Discharge status: Home / Self Care | Payer: No Typology Code available for payment source | Source: Skilled Nursing Facility | Attending: Internal Medicine | Admitting: Internal Medicine

## 2016-12-20 DIAGNOSIS — D631 Anemia in chronic kidney disease: Secondary | ICD-10-CM | POA: Diagnosis not present

## 2016-12-20 DIAGNOSIS — E162 Hypoglycemia, unspecified: Secondary | ICD-10-CM | POA: Diagnosis not present

## 2016-12-20 DIAGNOSIS — D509 Iron deficiency anemia, unspecified: Secondary | ICD-10-CM | POA: Diagnosis not present

## 2016-12-20 DIAGNOSIS — Z7901 Long term (current) use of anticoagulants: Secondary | ICD-10-CM | POA: Insufficient documentation

## 2016-12-20 DIAGNOSIS — N186 End stage renal disease: Secondary | ICD-10-CM | POA: Diagnosis not present

## 2016-12-20 DIAGNOSIS — N2581 Secondary hyperparathyroidism of renal origin: Secondary | ICD-10-CM | POA: Diagnosis not present

## 2016-12-20 DIAGNOSIS — Z23 Encounter for immunization: Secondary | ICD-10-CM | POA: Diagnosis not present

## 2016-12-20 LAB — BASIC METABOLIC PANEL
Anion gap: 10 (ref 5–15)
BUN: 41 mg/dL — ABNORMAL HIGH (ref 6–20)
CO2: 28 mmol/L (ref 22–32)
Calcium: 8.9 mg/dL (ref 8.9–10.3)
Chloride: 103 mmol/L (ref 101–111)
Creatinine, Ser: 6.2 mg/dL — ABNORMAL HIGH (ref 0.61–1.24)
GFR calc Af Amer: 10 mL/min — ABNORMAL LOW (ref >60)
GFR calc non Af Amer: 8 mL/min — ABNORMAL LOW (ref >60)
Glucose, Bld: 128 mg/dL — ABNORMAL HIGH (ref 65–99)
Potassium: 3.9 mmol/L (ref 3.5–5.1)
Sodium: 141 mmol/L (ref 135–145)

## 2016-12-20 LAB — CBC WITH DIFFERENTIAL/PLATELET
Basophils Absolute: 0 10*3/uL (ref 0.0–0.1)
Basophils Relative: 0 %
Eosinophils Absolute: 0.3 10*3/uL (ref 0.0–0.7)
Eosinophils Relative: 3 %
HCT: 29.8 % — ABNORMAL LOW (ref 39.0–52.0)
Hemoglobin: 9.5 g/dL — ABNORMAL LOW (ref 13.0–17.0)
Lymphocytes Relative: 18 %
Lymphs Abs: 1.5 10*3/uL (ref 0.7–4.0)
MCH: 32 pg (ref 26.0–34.0)
MCHC: 31.9 g/dL (ref 30.0–36.0)
MCV: 100.3 fL — ABNORMAL HIGH (ref 78.0–100.0)
Monocytes Absolute: 0.8 10*3/uL (ref 0.1–1.0)
Monocytes Relative: 9 %
Neutro Abs: 5.8 10*3/uL (ref 1.7–7.7)
Neutrophils Relative %: 70 %
Platelets: 322 10*3/uL (ref 150–400)
RBC: 2.97 MIL/uL — ABNORMAL LOW (ref 4.22–5.81)
RDW: 17.8 % — ABNORMAL HIGH (ref 11.5–15.5)
WBC: 8.4 10*3/uL (ref 4.0–10.5)

## 2016-12-20 LAB — PROTIME-INR
INR: 1.14
Prothrombin Time: 14.7 seconds (ref 11.4–15.2)

## 2016-12-22 ENCOUNTER — Encounter (HOSPITAL_COMMUNITY)
Admission: RE | Admit: 2016-12-22 | Discharge: 2016-12-22 | Disposition: A | Discharge status: Home / Self Care | Payer: No Typology Code available for payment source | Source: Skilled Nursing Facility | Attending: Internal Medicine | Admitting: Internal Medicine

## 2016-12-22 DIAGNOSIS — S72421D Displaced fracture of lateral condyle of right femur, subsequent encounter for closed fracture with routine healing: Secondary | ICD-10-CM | POA: Insufficient documentation

## 2016-12-22 DIAGNOSIS — Z7901 Long term (current) use of anticoagulants: Secondary | ICD-10-CM | POA: Diagnosis not present

## 2016-12-22 DIAGNOSIS — N401 Enlarged prostate with lower urinary tract symptoms: Secondary | ICD-10-CM | POA: Diagnosis not present

## 2016-12-22 DIAGNOSIS — X58XXXD Exposure to other specified factors, subsequent encounter: Secondary | ICD-10-CM | POA: Insufficient documentation

## 2016-12-22 LAB — PROTIME-INR
INR: 1.21
Prothrombin Time: 15.4 seconds — ABNORMAL HIGH (ref 11.4–15.2)

## 2016-12-23 ENCOUNTER — Encounter (HOSPITAL_COMMUNITY)
Admission: AD | Admit: 2016-12-23 | Discharge: 2016-12-23 | Disposition: A | Discharge status: Home / Self Care | Payer: Medicare Other | Source: Skilled Nursing Facility | Attending: *Deleted | Admitting: *Deleted

## 2016-12-23 DIAGNOSIS — N2581 Secondary hyperparathyroidism of renal origin: Secondary | ICD-10-CM | POA: Diagnosis not present

## 2016-12-23 DIAGNOSIS — D631 Anemia in chronic kidney disease: Secondary | ICD-10-CM | POA: Diagnosis not present

## 2016-12-23 DIAGNOSIS — N186 End stage renal disease: Secondary | ICD-10-CM | POA: Diagnosis not present

## 2016-12-23 DIAGNOSIS — D509 Iron deficiency anemia, unspecified: Secondary | ICD-10-CM | POA: Diagnosis not present

## 2016-12-23 DIAGNOSIS — E162 Hypoglycemia, unspecified: Secondary | ICD-10-CM | POA: Diagnosis not present

## 2016-12-23 DIAGNOSIS — Z23 Encounter for immunization: Secondary | ICD-10-CM | POA: Diagnosis not present

## 2016-12-23 LAB — PROTIME-INR
INR: 1.16
Prothrombin Time: 14.8 seconds (ref 11.4–15.2)

## 2016-12-24 ENCOUNTER — Encounter: Payer: Self-pay | Admitting: Internal Medicine

## 2016-12-24 ENCOUNTER — Inpatient Hospital Stay (HOSPITAL_COMMUNITY): Payer: Medicare Other | Attending: Internal Medicine

## 2016-12-24 ENCOUNTER — Other Ambulatory Visit (HOSPITAL_COMMUNITY)
Admission: AD | Admit: 2016-12-24 | Discharge: 2016-12-24 | Disposition: A | Discharge status: Home / Self Care | Payer: Medicare Other | Source: Skilled Nursing Facility | Attending: Pediatrics | Admitting: Pediatrics

## 2016-12-24 ENCOUNTER — Non-Acute Institutional Stay (SKILLED_NURSING_FACILITY): Payer: Medicare Other | Admitting: Internal Medicine

## 2016-12-24 DIAGNOSIS — X58XXXD Exposure to other specified factors, subsequent encounter: Secondary | ICD-10-CM | POA: Insufficient documentation

## 2016-12-24 DIAGNOSIS — Z992 Dependence on renal dialysis: Secondary | ICD-10-CM | POA: Diagnosis not present

## 2016-12-24 DIAGNOSIS — R600 Localized edema: Secondary | ICD-10-CM

## 2016-12-24 DIAGNOSIS — I82411 Acute embolism and thrombosis of right femoral vein: Secondary | ICD-10-CM | POA: Diagnosis not present

## 2016-12-24 DIAGNOSIS — I82401 Acute embolism and thrombosis of unspecified deep veins of right lower extremity: Secondary | ICD-10-CM | POA: Diagnosis not present

## 2016-12-24 DIAGNOSIS — I10 Essential (primary) hypertension: Secondary | ICD-10-CM | POA: Insufficient documentation

## 2016-12-24 DIAGNOSIS — S72421D Displaced fracture of lateral condyle of right femur, subsequent encounter for closed fracture with routine healing: Secondary | ICD-10-CM | POA: Insufficient documentation

## 2016-12-24 DIAGNOSIS — N186 End stage renal disease: Secondary | ICD-10-CM | POA: Diagnosis not present

## 2016-12-24 LAB — CBC
HCT: 32.2 % — ABNORMAL LOW (ref 39.0–52.0)
Hemoglobin: 10.4 g/dL — ABNORMAL LOW (ref 13.0–17.0)
MCH: 32.2 pg (ref 26.0–34.0)
MCHC: 32.3 g/dL (ref 30.0–36.0)
MCV: 99.7 fL (ref 78.0–100.0)
Platelets: 313 10*3/uL (ref 150–400)
RBC: 3.23 MIL/uL — ABNORMAL LOW (ref 4.22–5.81)
RDW: 18.2 % — ABNORMAL HIGH (ref 11.5–15.5)
WBC: 10.4 10*3/uL (ref 4.0–10.5)

## 2016-12-24 LAB — BASIC METABOLIC PANEL
Anion gap: 13 (ref 5–15)
BUN: 47 mg/dL — ABNORMAL HIGH (ref 6–20)
CO2: 25 mmol/L (ref 22–32)
Calcium: 9.1 mg/dL (ref 8.9–10.3)
Chloride: 97 mmol/L — ABNORMAL LOW (ref 101–111)
Creatinine, Ser: 5.26 mg/dL — ABNORMAL HIGH (ref 0.61–1.24)
GFR calc Af Amer: 12 mL/min — ABNORMAL LOW (ref >60)
GFR calc non Af Amer: 10 mL/min — ABNORMAL LOW (ref >60)
Glucose, Bld: 142 mg/dL — ABNORMAL HIGH (ref 65–99)
Potassium: 4.2 mmol/L (ref 3.5–5.1)
Sodium: 135 mmol/L (ref 135–145)

## 2016-12-24 NOTE — Progress Notes (Signed)
Location:   [facility] Room Number: 112/D Place of Service:  SNF (31) Provider:  Glennon Hamilton, MD  Patient Care Team: [name], MD as PCP - General (Internal Medicine) Cassandria Anger, MD as Consulting Physician (Endocrinology) Fran Lowes, MD as Consulting Physician (Nephrology)  Extended Emergency Contact Information Primary Emergency Contact: Spell,Malinda Address: [address]  [place] Phone: [phone] Work Phone: [phone] Relation: Legal Guardian Secondary Emergency Contact: Minnie,Richard Address: [address]          [place], [place] [address] [place] Phone: [phone] Relation: Brother  Code Status:  Full Code Goals of care: Advanced Directive information Advanced Directives 12/24/2016  Does Patient Have a Medical Advance Directive? Yes  Type of Advance Directive (No Data)  Does patient want to make changes to medical advance directive? No - Patient declined  Copy of [place] in Chart? -  Would patient like information on creating a medical advance directive? -  Pre-existing out of facility DNR order (yellow form or pink MOST form) -     Chief Complaint  Patient presents with  . Acute Visit    Right knee edema warm to touch    HPI:  Pt is a 71 y.o. male seen today for an acute visit for Right knee edema-apparently this developed some time overnight since it was not apparently present yesterday.  Patient's a poor historian but there's been no reported trauma to the area.  He is not really complaining of any acute pain has minimal tenderness here.  Vital signs appear to be baseline temperature is mildly elevated 99.7 blood pressure is 90/50 he tends to run in that range however.  Currently he is sitting in his wheelchair comfortably.  He does have a history of a right distal femur fracture that was surgically repaired.  He also has a  history of end-stage renal disease continues on dialysis every  He is also a type II diabetic blood sugars appear to be stable largely in the 100s.  He recently did complete treatment for a UTI. With a course of Rocephin.  He has also completed a four-week course of Coumadin for DVT prophylaxis status post surgery-  He does have a history of anemia in fact at one point hemoglobin 8.1 however most recent lab was up to 9.5 on lab done February 24     Past Medical History:  Diagnosis Date  . Abnormal CT scan, kidney 10/06/2011  . Acute pyelonephritis 10/07/2011  . Anemia    normocytic  . Anxiety    mental retardation  . Bladder wall thickening 10/06/2011  . BPH (benign prostatic hypertrophy)   . Diabetes mellitus   . Edema     history of lower extremity edema  . Heme positive stool   . Hydronephrosis   . Hyperkalemia   . Hyperlipidemia   . Hypernatremia   . Hypertension   . Hypothyroidism   . Impaired speech   . 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 MR (mental retardation)   . Obstructive uropathy   . 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 Poor historian poor historian  . Protein calorie malnutrition ([place])   . Pyelonephritis   . Renal failure (ARF), acute on chronic (HCC)   . Renal insufficiency    chronic history  . Smoking   . Uremia   . Urinary retention   . UTI (lower urinary tract infection) 10/06/2011  . UTI (lower urinary  tract infection)    Past Surgical History:  Procedure Laterality Date  . AV FISTULA PLACEMENT Left 07/06/2015   Procedure:  INSERTION LEFT ARM ARTERIOVENOUS GORTEX GRAFT;  Surgeon: Angelia Mould, MD;  Location: [facility];  Service: Vascular;  Laterality: Left;  . AV FISTULA PLACEMENT Right 02/26/2016   Procedure: ARTERIOVENOUS (AV) FISTULA CREATION ;  Surgeon: Angelia Mould, MD;  Location: [facility];  Service: Vascular;  Laterality: Right;  . [place] REMOVAL Left 10/09/2015   Procedure: REMOVAL OF ARTERIOVENOUS GORETEX GRAFT ([place])  Evacuation of Lymphocele, Vein Patch angioplasty of brachial artery.;  Surgeon: Angelia Mould, MD;  Location: [facility];  Service: Vascular;  Laterality: Left;  . BASCILIC VEIN TRANSPOSITION Right 02/26/2016   Procedure: Right BASCILIC VEIN TRANSPOSITION;  Surgeon: Angelia Mould, MD;  Location: [facility];  Service: Vascular;  Laterality: Right;  . CIRCUMCISION N/A 01/04/2014   Procedure: CIRCUMCISION ADULT (procedure #1);  Surgeon: Marissa Nestle, MD;  Location: [facility];  Service: Urology;  Laterality: N/A;  . CYSTOSCOPY W/ RETROGRADES Bilateral 06/29/2015   Procedure: CYSTOSCOPY, DILATION OF URETHRAL STRICTURE WITH BILATERAL RETROGRADE PYELOGRAM,SUPRAPUBIC TUBE CHANGE;  Surgeon: Festus Aloe, MD;  Location: [facility];  Service: Urology;  Laterality: Bilateral;  . CYSTOSCOPY WITH URETHRAL DILATATION N/A 12/29/2013   Procedure: CYSTOSCOPY WITH URETHRAL DILATATION;  Surgeon: Marissa Nestle, MD;  Location: [facility];  Service: Urology;  Laterality: N/A;  . ORIF FEMUR FRACTURE Right 11/22/2016   Procedure: OPEN REDUCTION INTERNAL FIXATION (ORIF) DISTAL FEMUR FRACTURE;  Surgeon: Rod Can, MD;  Location: [facility];  Service: Orthopedics;  Laterality: Right;  . PERIPHERAL VASCULAR CATHETERIZATION N/A 10/08/2015   Procedure: A/V Shuntogram;  Surgeon: Angelia Mould, MD;  Location: [facility];  Service: Cardiovascular;  Laterality: N/A;  . TRANSURETHRAL RESECTION OF PROSTATE N/A 01/04/2014   Procedure: TRANSURETHRAL RESECTION OF THE PROSTATE (TURP) (procedure #2);  Surgeon: Marissa Nestle, MD;  Location: [facility];  Service: Urology;  Laterality: N/A;    No Known Allergies  Current Outpatient Prescriptions on File Prior to Visit  Medication Sig Dispense Refill  . atorvastatin (LIPITOR) 40 MG tablet Take 40 mg by mouth every evening.    . carvedilol (COREG) 6.25 MG tablet Take 1 tablet (6.25 mg total) by mouth 2 (two) times daily with a meal. 60 tablet 3  . epoetin alfa  (EPOGEN,PROCRIT) 4000 UNIT/ML injection Inject 4,000 Units into the vein every 14 (fourteen) days.    . ferrous sulfate 325 (65 FE) MG tablet Take 325 mg by mouth 2 (two) times daily with a meal.     . levothyroxine (SYNTHROID, LEVOTHROID) 75 MCG tablet Take 75 mcg by mouth daily before breakfast.     . linagliptin (TRADJENTA) 5 MG TABS tablet Take 2.5 mg by mouth daily.     . Multiple Vitamin (MULTIVITAMIN WITH MINERALS) TABS tablet Take 1 tablet by mouth daily.    . Neo-Bacit-Poly-Lidocaine (FIRST AID PLUS LIDOCAINE EX) Apply 1 application topically 3 (three) times a week. Prior to dialysis    . omeprazole (PRILOSEC) 20 MG capsule Take 40 mg by mouth daily.    . ondansetron (ZOFRAN) 4 MG tablet Take 4 mg by mouth every 8 (eight) hours as needed for nausea or vomiting.    . tamsulosin (FLOMAX) 0.4 MG CAPS capsule Take 0.4 mg by mouth daily.    Marland Kitchen torsemide (DEMADEX) 10 MG tablet Take 5 tablets (50 mg total) by mouth daily. 30 tablet 3  . Vitamin D, Ergocalciferol, (  DRISDOL) 50000 UNITS CAPS capsule Take 1 capsule (50,000 Units total) by mouth every 7 (seven) days. 30 capsule 3   No current facility-administered medications on file prior to visit.      Review of Systems  Constitutional: Negative for activity change, appetite change, chills, diaphoresis, fatigue and fever.  HENT: Negative for mouth sores, postnasal drip, rhinorrhea, sinus pain and sore throat.  Respiratory: Negative for apnea, cough, chest tightness, shortness of breath and wheezing.  Cardiovascular: Negative for chest pain, palpitations and leg swelling. Except for edema of the right knee Gastrointestinal: Negative for abdominal distention, abdominal pain, constipation, diarrhea, nausea and vomiting.  Genitourinary: Negative for dysuria and frequency. Has finished treatment for UTI Musculoskeletal: Positive for edema of his right knee-this is slightly tender but not acutely painful Skin: Negative for rash. Does have a small  abrasion under his right knee Neurological: Negative for dizziness, syncope, weakness, light-headedness and numbness.  Psychiatric/Behavioral: Negative for behavioral problems, confusion and sleep disturbance.    Immunization History  Administered Date(s) Administered  . Influenza,inj,Quad PF,36+ Mos 12/26/2013, 07/09/2015  . PPD Test 03/07/2015  . Pneumococcal Polysaccharide-23 03/04/2015   Pertinent  Health Maintenance Due  Topic Date Due  . FOOT EXAM  08/23/1958  . OPHTHALMOLOGY EXAM  08/23/1958  . URINE MICROALBUMIN  08/23/1958  . COLONOSCOPY  08/23/1998  . INFLUENZA VACCINE  09/26/2017 (Originally 05/27/2016)  . PNA vac Low Risk Adult (2 of 2 - PCV13) 09/26/2017 (Originally 03/03/2016)  . HEMOGLOBIN A1C  05/20/2017   No flowsheet data found. Functional Status Survey:    Vitals:   12/24/16 1107  BP: (!) 84/43  Pulse: 94  Resp: (!) 24  Temp: 99.7 F (37.6 C)  TempSrc: Oral  SpO2: 100%  Of note manual blood pressure was 90/50  Physical Exam  . In general this is a middle-aged male sitting comfortably in his wheelchair  His skin is warm and dry  He does have a small abrasion noted under his right knee small open area this does not appear to have any sign of infection there is no surrounding erythema drainage     Eyes pupils appear reactive to light visual acuity appears grossly intact.  Oropharynx Is clear mucous membranes moist  Chest is clear to auscultation with somewhat poor respiratory effort-there is no labored breathing.   heart is regular rate and rhythm with apical pulse in the 90s  Abdomen is soft does not appear to be tender there are positive bowel sounds .  GU could not appreciate any suprapubic distention o   Muscleskeletal does move all extremities 4--ambulatory in wheelchair Right knee does have edema this is minimally tender to touch slightly warm there is scabbing from the surgical site but did not really see signs of  cellulitis  Does not really have acute pain with flexion and extension at the knee.  He does not really have lower extremity edema otherwise pedall pulse appears to be intact on the right .  Neurologic is grossly intact speech is clear   Psych he is pleasant and cooperative with exam will answer questions speaking a bit more than wne I-evaluated him when he came to the facility   Labs reviewed:  Recent Labs  11/24/16 0339 11/25/16 0428 11/26/16 0613 11/28/16 1400 12/20/16 0945  NA 136 136 136 138 141  K 5.6* 4.8 4.5 4.0 3.9  CL 100* 97* 97* 97* 103  CO2 23 25 24 24 28   GLUCOSE 91 112* 88 123* 128*  BUN 55*  41* 64* 47* 41*  CREATININE 7.68* 6.47* 8.80* 5.89* 6.20*  CALCIUM 8.3* 8.7* 8.3* 8.9 8.9  PHOS 6.9* 6.7* 8.7*  --   --     Recent Labs  04/03/16 0935  11/20/16 1446  11/25/16 0428 11/26/16 0613 11/28/16 1400  AST 13*  --  27  --   --   --  27  ALT 12*  --  33  --   --   --  <5*  ALKPHOS 66  --  77  --   --   --  68  BILITOT 0.4  --  0.4  --   --   --  0.9  PROT 6.0*  --  7.1  --   --   --  7.6  ALBUMIN 3.0*  < > 3.7  < > 2.6* 2.3* 2.9*  < > = values in this interval not displayed.  Recent Labs  11/20/16 1446  11/28/16 1400 12/04/16 0700 12/20/16 0945  WBC 8.8  < > 13.7* 10.6* 8.4  NEUTROABS 6.4  --  9.7*  --  5.8  HGB 9.0*  < > 9.9* 8.1* 9.5*  HCT 28.6*  < > 30.9* 24.7* 29.8*  MCV 110.9*  < > 100.0 97.6 100.3*  PLT 176  < > 323 317 322  < > = values in this interval not displayed. Lab Results  Component Value Date   TSH 1.071 04/03/2016   Lab Results  Component Value Date   HGBA1C 5.0 11/20/2016   No results found for: CHOL, HDL, LDLCALC, LDLDIRECT, TRIG, CHOLHDL  Significant Diagnostic Results in last 30 days:  No results found.  Assessment/Plan #1-right knee edema-dictated x-ray of the area-also will get a venous Doppler.    Also update a CBC with differential and metabolic panel-will await those results he is not really having any  acute tenderness with this appears to be at baseline otherwise.  Regards to low blood pressures this is not really new his  Bidil as been discontinued-continues on low-dose Coreg-secondary to at times tachycardia with pulses consistently running in the 90s it appears will be hesitant to alter this since clinically he is stable and asymptomatic.  This was discussed with Dr. Mariea Clonts previously.   Addendum-we have received the results of the Doppler which showed DVT of the distal femoral vein on the right-.  We'll reinitiate Coumadin-this was discussed with nephrology as well as with Dr. Dellia Nims via phone.--Also discussed   with the consulting pharmacist in the facility  Will start with Coumadin 7.5 mg tonight and recheck an INR tomorrow and adjust accordingly.  Per discussion with the consulting pharmacist Lovenox is somewhat contraindicated secondary to bleeding risk in dialysis patients--also is limited information on effectiveness and safety of Eliquis-again per discussion with nephrologist-Dr. Janeth Rase well as with Dr. Dellia Nims and  consulting pharmacist will start the Coumadin and titrate to a therapeutic level   [id number]-of note greater than 50 minutes spent assessing patient-discussing his status with nursing staff-reviewing his studies-and extensive discussion with nephrology-consulting pharmacist-as well as Dr. Jefferson Fuel note greater than 50% of time spent coordinating plan of care      Oralia Manis, Leona

## 2016-12-25 ENCOUNTER — Encounter (HOSPITAL_COMMUNITY)
Admission: RE | Admit: 2016-12-25 | Discharge: 2016-12-25 | Disposition: A | Discharge status: Home / Self Care | Payer: Medicare Other | Source: Skilled Nursing Facility | Attending: Internal Medicine | Admitting: Internal Medicine

## 2016-12-25 DIAGNOSIS — I82409 Acute embolism and thrombosis of unspecified deep veins of unspecified lower extremity: Secondary | ICD-10-CM | POA: Insufficient documentation

## 2016-12-25 DIAGNOSIS — D631 Anemia in chronic kidney disease: Secondary | ICD-10-CM | POA: Diagnosis not present

## 2016-12-25 DIAGNOSIS — N186 End stage renal disease: Secondary | ICD-10-CM | POA: Diagnosis not present

## 2016-12-25 DIAGNOSIS — Z992 Dependence on renal dialysis: Secondary | ICD-10-CM | POA: Diagnosis not present

## 2016-12-25 DIAGNOSIS — D509 Iron deficiency anemia, unspecified: Secondary | ICD-10-CM | POA: Diagnosis not present

## 2016-12-25 DIAGNOSIS — E162 Hypoglycemia, unspecified: Secondary | ICD-10-CM | POA: Diagnosis not present

## 2016-12-25 DIAGNOSIS — N2581 Secondary hyperparathyroidism of renal origin: Secondary | ICD-10-CM | POA: Diagnosis not present

## 2016-12-25 HISTORY — DX: Acute embolism and thrombosis of unspecified deep veins of unspecified lower extremity: I82.409

## 2016-12-25 LAB — PROTIME-INR
INR: 1.17
Prothrombin Time: 15 seconds (ref 11.4–15.2)

## 2016-12-26 ENCOUNTER — Encounter (HOSPITAL_COMMUNITY)
Admission: AD | Admit: 2016-12-26 | Discharge: 2016-12-26 | Disposition: A | Discharge status: Home / Self Care | Payer: Medicare Other | Source: Skilled Nursing Facility | Attending: *Deleted | Admitting: *Deleted

## 2016-12-26 ENCOUNTER — Emergency Department (HOSPITAL_COMMUNITY): Payer: Medicare Other

## 2016-12-26 ENCOUNTER — Emergency Department (HOSPITAL_COMMUNITY)
Admission: EM | Admit: 2016-12-26 | Discharge: 2016-12-26 | Disposition: A | Discharge status: Home / Self Care | Payer: Medicare Other | Attending: Emergency Medicine | Admitting: Emergency Medicine

## 2016-12-26 ENCOUNTER — Inpatient Hospital Stay
Admission: RE | Admit: 2016-12-26 | Discharge: 2017-04-27 | Disposition: A | Discharge status: Nursing Home (Includes ICF) | Payer: Medicare Other | Source: Ambulatory Visit | Attending: Internal Medicine | Admitting: Internal Medicine

## 2016-12-26 ENCOUNTER — Encounter (HOSPITAL_COMMUNITY): Payer: Self-pay | Admitting: Emergency Medicine

## 2016-12-26 DIAGNOSIS — Z992 Dependence on renal dialysis: Secondary | ICD-10-CM | POA: Insufficient documentation

## 2016-12-26 DIAGNOSIS — I120 Hypertensive chronic kidney disease with stage 5 chronic kidney disease or end stage renal disease: Secondary | ICD-10-CM | POA: Insufficient documentation

## 2016-12-26 DIAGNOSIS — N186 End stage renal disease: Secondary | ICD-10-CM | POA: Diagnosis not present

## 2016-12-26 DIAGNOSIS — N390 Urinary tract infection, site not specified: Secondary | ICD-10-CM | POA: Diagnosis not present

## 2016-12-26 DIAGNOSIS — E1122 Type 2 diabetes mellitus with diabetic chronic kidney disease: Secondary | ICD-10-CM | POA: Insufficient documentation

## 2016-12-26 DIAGNOSIS — Z79899 Other long term (current) drug therapy: Secondary | ICD-10-CM | POA: Diagnosis not present

## 2016-12-26 DIAGNOSIS — E039 Hypothyroidism, unspecified: Secondary | ICD-10-CM | POA: Diagnosis not present

## 2016-12-26 DIAGNOSIS — R509 Fever, unspecified: Secondary | ICD-10-CM | POA: Diagnosis not present

## 2016-12-26 LAB — URINALYSIS, MICROSCOPIC (REFLEX): RBC / HPF: NONE SEEN RBC/hpf (ref 0–5)

## 2016-12-26 LAB — BASIC METABOLIC PANEL
Anion gap: 9 (ref 5–15)
BUN: 38 mg/dL — ABNORMAL HIGH (ref 6–20)
CO2: 30 mmol/L (ref 22–32)
Calcium: 8.7 mg/dL — ABNORMAL LOW (ref 8.9–10.3)
Chloride: 97 mmol/L — ABNORMAL LOW (ref 101–111)
Creatinine, Ser: 4.77 mg/dL — ABNORMAL HIGH (ref 0.61–1.24)
GFR calc Af Amer: 13 mL/min — ABNORMAL LOW (ref >60)
GFR calc non Af Amer: 11 mL/min — ABNORMAL LOW (ref >60)
Glucose, Bld: 118 mg/dL — ABNORMAL HIGH (ref 65–99)
Potassium: 3.7 mmol/L (ref 3.5–5.1)
Sodium: 136 mmol/L (ref 135–145)

## 2016-12-26 LAB — URINALYSIS, ROUTINE W REFLEX MICROSCOPIC
Glucose, UA: NEGATIVE mg/dL
Hgb urine dipstick: NEGATIVE
Nitrite: NEGATIVE
Protein, ur: 30 mg/dL — AB
Specific Gravity, Urine: 1.02 (ref 1.005–1.030)
pH: 5.5 (ref 5.0–8.0)

## 2016-12-26 LAB — CBC WITH DIFFERENTIAL/PLATELET
Basophils Absolute: 0 10*3/uL (ref 0.0–0.1)
Basophils Relative: 0 %
Eosinophils Absolute: 0.1 10*3/uL (ref 0.0–0.7)
Eosinophils Relative: 1 %
HCT: 29.9 % — ABNORMAL LOW (ref 39.0–52.0)
Hemoglobin: 9.6 g/dL — ABNORMAL LOW (ref 13.0–17.0)
Lymphocytes Relative: 16 %
Lymphs Abs: 1.3 10*3/uL (ref 0.7–4.0)
MCH: 31.8 pg (ref 26.0–34.0)
MCHC: 32.1 g/dL (ref 30.0–36.0)
MCV: 99 fL (ref 78.0–100.0)
Monocytes Absolute: 1.8 10*3/uL — ABNORMAL HIGH (ref 0.1–1.0)
Monocytes Relative: 22 %
Neutro Abs: 4.8 10*3/uL (ref 1.7–7.7)
Neutrophils Relative %: 61 %
Platelets: 267 10*3/uL (ref 150–400)
RBC: 3.02 MIL/uL — ABNORMAL LOW (ref 4.22–5.81)
RDW: 18.1 % — ABNORMAL HIGH (ref 11.5–15.5)
WBC: 8 10*3/uL (ref 4.0–10.5)

## 2016-12-26 LAB — PROTIME-INR
INR: 1.26
INR: 1.19
Prothrombin Time: 15.9 seconds — ABNORMAL HIGH (ref 11.4–15.2)
Prothrombin Time: 15.2 seconds (ref 11.4–15.2)

## 2016-12-26 MED ORDER — CEPHALEXIN 500 MG PO CAPS: 500.0000 mg | ORAL_CAPSULE | Freq: Four times a day (QID) | ORAL | 0 refills | Status: DC

## 2016-12-26 MED ORDER — CEFTRIAXONE SODIUM 1 G IJ SOLR
1.0000 g | Freq: Once | INTRAMUSCULAR | Status: AC
  Administered 2016-12-26: 1 g via INTRAVENOUS
  Filled 2016-12-26: qty 10

## 2016-12-26 NOTE — ED Notes (Signed)
Patient refuses in and out catheterization.

## 2016-12-26 NOTE — ED Triage Notes (Signed)
Pt sent from [facility] center for evaluation of fever.  Pt denies any complaints.  Has known DVT in right leg with swelling noted.

## 2016-12-26 NOTE — ED Provider Notes (Signed)
[facility] DEPT Provider Note   CSN: [id number] Arrival date & time: 12/26/16  4166   By signing my name below, I, Hilbert Odor, attest that this documentation has been prepared under the direction and in the presence of Noemi Chapel, MD. Electronically Signed: Hilbert Odor, Scribe. 12/26/16. 9:34 AM. History   Chief Complaint Chief Complaint  Patient presents with  . Fever    LEVEL 5 CAVEAT: Mental Retardation The history is provided by the nursing home and medical records. No language interpreter was used.  HPI Comments: Zachary [name] is a 71 y.o. male with a hx of chronic renal disease, dialysis, T, TR, and Sat; diabetes, and HTN presents to the Emergency Department from [facility] with a fever around 1am last night. Patient fever was noted to be 101 by [facility] at 12:50 am today and tylenol was given at 1:50 am.He was admitted 5 weeks ago for hip fracture. He is currently on coumadin for dvt prophylaxis. He developed right knee swelling on 12/24/2016. Doppler was ordered dvt of the distal femoral vein on the right. Discussed with pharmacology and nephrology and decided to put him on coumadin rather than another oral anticoagulant.  Now comes back with potential fever.     Past Medical History:  Diagnosis Date  . Abnormal CT scan, kidney 10/06/2011  . Acute pyelonephritis 10/07/2011  . Anemia    normocytic  . Anxiety    mental retardation  . Bladder wall thickening 10/06/2011  . BPH (benign prostatic hypertrophy)   . Diabetes mellitus   . Edema     history of lower extremity edema  . Heme positive stool   . Hydronephrosis   . Hyperkalemia   . Hyperlipidemia   . Hypernatremia   . Hypertension   . Hypothyroidism   . Impaired speech   . 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 MR (mental retardation)   . Obstructive uropathy   . 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 Poor historian poor historian  . Protein calorie malnutrition ([place])   .  Pyelonephritis   . Renal failure (ARF), acute on chronic (HCC)   . Renal insufficiency    chronic history  . Smoking   . Uremia   . Urinary retention   . UTI (lower urinary tract infection) 10/06/2011  . UTI (lower urinary tract infection)     Patient Active Problem List   Diagnosis Date Noted  . DVT (deep venous thrombosis) ([place]) 12/25/2016  . Foul smelling urine 12/12/2016  . Postoperative anemia due to acute blood loss   . Closed comminuted intra-articular fracture of distal femur, right, initial encounter ([place]) 11/22/2016  . Right femoral fracture ([place]) 11/20/2016  . Hydronephrosis, bilateral 03/10/2016  . End stage renal disease ([place]) 10/08/2015  . 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 Abnormal CT scan, esophagus   . Protein-calorie malnutrition, severe ([place]) 07/10/2015  . Essential hypertension 07/08/2015  . Hyperlipidemia 07/08/2015  . Nausea & vomiting 07/08/2015  . Obstructive uropathy 04/17/2015  . Bladder neck contracture   . Anemia, chronic disease 04/16/2015  . Benign prostatic hyperplasia with urinary retention 03/03/2015  . Mental retardation 10/10/2011  . Acute pyelonephritis 10/07/2011  . 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 Bladder wall thickening 10/06/2011  . DM (diabetes mellitus), type 2, uncontrolled ([place]) 10/02/2011  . Iron deficiency anemia 10/02/2011  . FX CLOSED FIBULA NOS 02/10/2008    Past Surgical History:  Procedure Laterality Date  . AV FISTULA PLACEMENT Left 07/06/2015  Procedure:  INSERTION LEFT ARM ARTERIOVENOUS GORTEX GRAFT;  Surgeon: Angelia Mould, MD;  Location: [facility];  Service: Vascular;  Laterality: Left;  . AV FISTULA PLACEMENT Right 02/26/2016   Procedure: ARTERIOVENOUS (AV) FISTULA CREATION ;  Surgeon: Angelia Mould, MD;  Location: [facility];  Service: Vascular;  Laterality: Right;  . [place] REMOVAL Left 10/09/2015   Procedure: REMOVAL OF ARTERIOVENOUS GORETEX GRAFT ([place]) Evacuation of Lymphocele, Vein  Patch angioplasty of brachial artery.;  Surgeon: Angelia Mould, MD;  Location: [facility];  Service: Vascular;  Laterality: Left;  . BASCILIC VEIN TRANSPOSITION Right 02/26/2016   Procedure: Right BASCILIC VEIN TRANSPOSITION;  Surgeon: Angelia Mould, MD;  Location: [facility];  Service: Vascular;  Laterality: Right;  . CIRCUMCISION N/A 01/04/2014   Procedure: CIRCUMCISION ADULT (procedure #1);  Surgeon: Marissa Nestle, MD;  Location: [facility];  Service: Urology;  Laterality: N/A;  . CYSTOSCOPY W/ RETROGRADES Bilateral 06/29/2015   Procedure: CYSTOSCOPY, DILATION OF URETHRAL STRICTURE WITH BILATERAL RETROGRADE PYELOGRAM,SUPRAPUBIC TUBE CHANGE;  Surgeon: Festus Aloe, MD;  Location: [facility];  Service: Urology;  Laterality: Bilateral;  . CYSTOSCOPY WITH URETHRAL DILATATION N/A 12/29/2013   Procedure: CYSTOSCOPY WITH URETHRAL DILATATION;  Surgeon: Marissa Nestle, MD;  Location: [facility];  Service: Urology;  Laterality: N/A;  . ORIF FEMUR FRACTURE Right 11/22/2016   Procedure: OPEN REDUCTION INTERNAL FIXATION (ORIF) DISTAL FEMUR FRACTURE;  Surgeon: Rod Can, MD;  Location: [facility];  Service: Orthopedics;  Laterality: Right;  . PERIPHERAL VASCULAR CATHETERIZATION N/A 10/08/2015   Procedure: A/V Shuntogram;  Surgeon: Angelia Mould, MD;  Location: [facility];  Service: Cardiovascular;  Laterality: N/A;  . TRANSURETHRAL RESECTION OF PROSTATE N/A 01/04/2014   Procedure: TRANSURETHRAL RESECTION OF THE PROSTATE (TURP) (procedure #2);  Surgeon: Marissa Nestle, MD;  Location: [facility];  Service: Urology;  Laterality: N/A;       Home Medications    Prior to Admission medications   Medication Sig Start Date End Date Taking? Authorizing Provider  Amino Acids-Protein Hydrolys (FEEDING SUPPLEMENT, PRO-STAT 64,) LIQD Take 30 mLs by mouth 2 (two) times daily between meals.   Yes Historical Provider, MD  atorvastatin (LIPITOR) 40 MG tablet Take 40 mg by mouth every evening.   Yes Historical  Provider, MD  carvedilol (COREG) 6.25 MG tablet Take 1 tablet (6.25 mg total) by mouth 2 (two) times daily with a meal. 03/08/15  Yes [name], MD  epoetin alfa (EPOGEN,PROCRIT) 4000 UNIT/ML injection Inject 4,000 Units into the vein every 14 (fourteen) days.   Yes Historical Provider, MD  ferrous sulfate 325 (65 FE) MG tablet Take 325 mg by mouth 2 (two) times daily with a meal.    Yes Historical Provider, MD  levothyroxine (SYNTHROID, LEVOTHROID) 75 MCG tablet Take 75 mcg by mouth daily before breakfast.    Yes Historical Provider, MD  linagliptin (TRADJENTA) 5 MG TABS tablet Take 2.5 mg by mouth every evening.    Yes Historical Provider, MD  Multiple Vitamin (MULTIVITAMIN WITH MINERALS) TABS tablet Take 1 tablet by mouth every evening.    Yes Historical Provider, MD  Neo-Bacit-Poly-Lidocaine (FIRST AID PLUS LIDOCAINE EX) Apply 1 application topically 3 (three) times a week. Prior to dialysis   Yes Historical Provider, MD  omeprazole (PRILOSEC) 20 MG capsule Take 40 mg by mouth every evening.    Yes Historical Provider, MD  ondansetron (ZOFRAN) 4 MG tablet Take 4 mg by mouth every 8 (eight) hours as needed for nausea or vomiting.  Yes Historical Provider, MD  tamsulosin (FLOMAX) 0.4 MG CAPS capsule Take 0.4 mg by mouth daily.   Yes Historical Provider, MD  torsemide (DEMADEX) 10 MG tablet Take 5 tablets (50 mg total) by mouth daily. 04/07/16  Yes [name], MD  Vitamin D, Ergocalciferol, (DRISDOL) 50000 UNITS CAPS capsule Take 1 capsule (50,000 Units total) by mouth every 7 (seven) days. 03/08/15  Yes [name], MD  cephALEXin (KEFLEX) 500 MG capsule Take 1 capsule (500 mg total) by mouth 4 (four) times daily. 12/26/16   Noemi Chapel, MD    Family History Family History  Problem Relation Age of Onset  . Cancer Mother     Social History Social History  Substance Use Topics  . Smoking status: Never Smoker  . Smokeless tobacco: Never Used  . Alcohol use No     Comment: occ. use        Allergies   Patient has no known allergies.   Review of Systems Review of Systems  Unable to perform ROS: Other (Mental Retardation)     Physical Exam Updated Vital Signs BP 103/65 (BP Location: Left Arm)   Pulse 94   Temp 98.7 F (37.1 C) (Rectal)   Resp 18   Ht 6' (1.829 m)   Wt 173 lb (78.5 kg)   SpO2 100%   BMI 23.46 kg/m   Physical Exam  Constitutional: He appears well-developed and well-nourished. No distress.  Well appearing. No distress noted. Interactive. Non-toxic.  HENT:  Head: Normocephalic and atraumatic.  Mouth/Throat: Oropharynx is clear and moist. No oropharyngeal exudate.  Oropharynx clear and moist. No exudates over tonsils.  Eyes: Conjunctivae and EOM are normal. Pupils are equal, round, and reactive to light. Right eye exhibits no discharge. Left eye exhibits no discharge. No scleral icterus.  Neck: Normal range of motion. Neck supple. No JVD present. No thyromegaly present.  No JVD  Cardiovascular: Normal rate, regular rhythm, normal heart sounds and intact distal pulses.  Exam reveals no gallop and no friction rub.   No murmur heard. Has fistula in there RUE. Mid right upper extremity, no thrills. No overlying redness. Pulse 85. No tachycardia.  Pulmonary/Chest: Effort normal and breath sounds normal. No respiratory distress. He has no wheezes. He has no rales.  Lungs clear. sats 99%. No rales. No increased work of breathing.  Abdominal: Soft. Bowel sounds are normal. He exhibits no distension and no mass. There is no tenderness.  Soft, non-tender.  Genitourinary:  Genitourinary Comments: Normal appearing penis, scrotum, and testicles. No erythema.  Musculoskeletal: Normal range of motion. He exhibits no edema or tenderness.  Surgical incision at the right knee. Well healed. Some tender to palpation around the right knee. No pitting edema to either leg. SLR is intact on the right.  Lymphadenopathy:    He has no cervical adenopathy.   Neurological: He is alert. Coordination normal.  Skin: Skin is warm and dry. No rash noted. No erythema.  Psychiatric: He has a normal mood and affect. His behavior is normal.  Nursing note and vitals reviewed.    ED Treatments / Results  DIAGNOSTIC STUDIES: Oxygen Saturation is 97% on RA, normal by my interpretation.   Labs  (all labs ordered are listed, but only abnormal results are displayed) Labs Reviewed  URINALYSIS, ROUTINE W REFLEX MICROSCOPIC - Abnormal; Notable for the following:       Result Value   Color, Urine AMBER (*)    APPearance CLOUDY (*)    Bilirubin Urine SMALL (*)  Ketones, ur TRACE (*)    Protein, ur 30 (*)    Leukocytes, UA MODERATE (*)    All other components within normal limits  CBC WITH DIFFERENTIAL/PLATELET - Abnormal; Notable for the following:    RBC 3.02 (*)    Hemoglobin 9.6 (*)    HCT 29.9 (*)    RDW 18.1 (*)    Monocytes Absolute 1.8 (*)    All other components within normal limits  BASIC METABOLIC PANEL - Abnormal; Notable for the following:    Chloride 97 (*)    Glucose, Bld 118 (*)    BUN 38 (*)    Creatinine, Ser 4.77 (*)    Calcium 8.7 (*)    GFR calc non Af Amer 11 (*)    GFR calc Af Amer 13 (*)    All other components within normal limits  URINALYSIS, MICROSCOPIC (REFLEX) - Abnormal; Notable for the following:    Bacteria, UA MANY (*)    Squamous Epithelial / LPF 0-5 (*)    All other components within normal limits  URINE CULTURE  PROTIME-INR    Radiology Dg Chest 2 View  Result Date: 12/26/2016 CLINICAL DATA:  Fevers EXAM: CHEST  2 VIEW COMPARISON:  11/20/2016 FINDINGS: The heart size and mediastinal contours are within normal limits. Both lungs are clear. The visualized skeletal structures are unremarkable. IMPRESSION: No active cardiopulmonary disease. Electronically Signed   By: Inez Catalina M.D.   On: 12/26/2016 10:50   US Venous Img Lower Unilateral Right  Result Date: 12/24/2016 CLINICAL DATA:  Right lower  extremity edema over the last day. EXAM: Right LOWER EXTREMITY VENOUS DOPPLER ULTRASOUND TECHNIQUE: Gray-scale sonography with graded compression, as well as color Doppler and duplex ultrasound were performed to evaluate the lower extremity deep venous systems from the level of the common femoral vein and including the common femoral, femoral, profunda femoral, popliteal and calf veins including the posterior tibial, peroneal and gastrocnemius veins when visible. The superficial great saphenous vein was also interrogated. Spectral Doppler was utilized to evaluate flow at rest and with distal augmentation maneuvers in the common femoral, femoral and popliteal veins. COMPARISON:  None. FINDINGS: Contralateral Common Femoral Vein: Respiratory phasicity is normal and symmetric with the symptomatic side. No evidence of thrombus. Normal compressibility. Common Femoral Vein: Normal proximally. Thrombus present in the distal femoral vein. This is nonocclusive. Saphenofemoral Junction: No evidence of thrombus. Normal compressibility and flow on color Doppler imaging. Profunda Femoral Vein: No evidence of thrombus. Normal compressibility and flow on color Doppler imaging. Popliteal Vein: No evidence of thrombus. Normal compressibility, respiratory phasicity and response to augmentation. Calf Veins: No evidence of thrombus. Normal compressibility and flow on color Doppler imaging. Superficial Great Saphenous Vein: No evidence of thrombus. Normal compressibility and flow on color Doppler imaging. Venous Reflux:  None. Other Findings:  None. IMPRESSION: Deep venous thrombosis of the distal femoral vein on the right. This is nonocclusive. Electronically Signed   By: Nelson Chimes M.D.   On: 12/24/2016 15:32    Procedures Procedures (including critical care time)  Medications Ordered in ED Medications  cefTRIAXone (ROCEPHIN) 1 g in dextrose 5 % 50 mL IVPB (not administered)     Initial Impression / Assessment and Plan /  ED Course  I have reviewed the triage vital signs and the nursing notes.  Pertinent labs & imaging results that were available during my care of the patient were reviewed by me and considered in my medical decision making (see chart for details).  The pt is already anticoagulated - there is no SOB, hypoxia or tachycardia to suggest PE and thus no indication to do a CT and risk contrast nephropathy when the pt is already being treated for DVT with coumadin.  Has UTI on urinalysis - known renal failure, has no leukocytosis. Antibiotics and back to [facility] center.   Final Clinical Impressions(s) / ED Diagnoses   Final diagnoses:  Lower urinary tract infectious disease    New Prescriptions New Prescriptions   CEPHALEXIN (KEFLEX) 500 MG CAPSULE    Take 1 capsule (500 mg total) by mouth 4 (four) times daily.   I personally performed the services described in this documentation, which was scribed in my presence. The recorded information has been reviewed and is accurate.       Noemi Chapel, MD 12/26/16 [phone]

## 2016-12-26 NOTE — ED Notes (Signed)
Spoke with Malinda from [place], who is the patient's legal guardian.

## 2016-12-26 NOTE — Discharge Instructions (Signed)
Keflex 4 times daily for 10 days Culture should be completed in the next 2 days Please follow up results and change antibiotics as needed accordingly.  Return to ER for severe or worsening symtpoms including high fevers, vomiting or weakness / changes in mental status.

## 2016-12-26 NOTE — ED Notes (Signed)
Patient still refusing in and out cath. Condom cath placed on patient.

## 2016-12-26 NOTE — ED Notes (Signed)
No urine in collection bag. Patient states he refuses in and out cath.

## 2016-12-27 DIAGNOSIS — E162 Hypoglycemia, unspecified: Secondary | ICD-10-CM | POA: Diagnosis not present

## 2016-12-27 DIAGNOSIS — Z992 Dependence on renal dialysis: Secondary | ICD-10-CM | POA: Diagnosis not present

## 2016-12-27 DIAGNOSIS — D631 Anemia in chronic kidney disease: Secondary | ICD-10-CM | POA: Diagnosis not present

## 2016-12-27 DIAGNOSIS — N2581 Secondary hyperparathyroidism of renal origin: Secondary | ICD-10-CM | POA: Diagnosis not present

## 2016-12-27 DIAGNOSIS — D509 Iron deficiency anemia, unspecified: Secondary | ICD-10-CM | POA: Diagnosis not present

## 2016-12-27 DIAGNOSIS — N186 End stage renal disease: Secondary | ICD-10-CM | POA: Diagnosis not present

## 2016-12-28 ENCOUNTER — Non-Acute Institutional Stay (SKILLED_NURSING_FACILITY): Payer: Medicare Other | Admitting: Internal Medicine

## 2016-12-28 ENCOUNTER — Encounter (HOSPITAL_COMMUNITY)
Admission: RE | Admit: 2016-12-28 | Discharge: 2016-12-28 | Disposition: A | Discharge status: Home / Self Care | Payer: Medicare Other | Attending: Pediatrics | Admitting: Pediatrics

## 2016-12-28 DIAGNOSIS — Z7901 Long term (current) use of anticoagulants: Secondary | ICD-10-CM | POA: Diagnosis not present

## 2016-12-28 DIAGNOSIS — N390 Urinary tract infection, site not specified: Secondary | ICD-10-CM | POA: Diagnosis not present

## 2016-12-28 DIAGNOSIS — I82401 Acute embolism and thrombosis of unspecified deep veins of right lower extremity: Secondary | ICD-10-CM | POA: Diagnosis not present

## 2016-12-28 DIAGNOSIS — D638 Anemia in other chronic diseases classified elsewhere: Secondary | ICD-10-CM

## 2016-12-28 LAB — CBC WITH DIFFERENTIAL/PLATELET
Basophils Absolute: 0 10*3/uL (ref 0.0–0.1)
Basophils Relative: 0 %
Eosinophils Absolute: 0.2 10*3/uL (ref 0.0–0.7)
Eosinophils Relative: 2 %
HCT: 24.8 % — ABNORMAL LOW (ref 39.0–52.0)
Hemoglobin: 8 g/dL — ABNORMAL LOW (ref 13.0–17.0)
Lymphocytes Relative: 22 %
Lymphs Abs: 2.2 10*3/uL (ref 0.7–4.0)
MCH: 32.1 pg (ref 26.0–34.0)
MCHC: 32.3 g/dL (ref 30.0–36.0)
MCV: 99.6 fL (ref 78.0–100.0)
Monocytes Absolute: 1.2 10*3/uL — ABNORMAL HIGH (ref 0.1–1.0)
Monocytes Relative: 13 %
Neutro Abs: 6.3 10*3/uL (ref 1.7–7.7)
Neutrophils Relative %: 63 %
Platelets: 344 10*3/uL (ref 150–400)
RBC: 2.49 MIL/uL — ABNORMAL LOW (ref 4.22–5.81)
RDW: 17.9 % — ABNORMAL HIGH (ref 11.5–15.5)
WBC: 9.9 10*3/uL (ref 4.0–10.5)

## 2016-12-28 LAB — BASIC METABOLIC PANEL
Anion gap: 10 (ref 5–15)
BUN: 32 mg/dL — ABNORMAL HIGH (ref 6–20)
CO2: 27 mmol/L (ref 22–32)
Calcium: 8.7 mg/dL — ABNORMAL LOW (ref 8.9–10.3)
Chloride: 100 mmol/L — ABNORMAL LOW (ref 101–111)
Creatinine, Ser: 4.25 mg/dL — ABNORMAL HIGH (ref 0.61–1.24)
GFR calc Af Amer: 15 mL/min — ABNORMAL LOW (ref >60)
GFR calc non Af Amer: 13 mL/min — ABNORMAL LOW (ref >60)
Glucose, Bld: 93 mg/dL (ref 65–99)
Potassium: 3.8 mmol/L (ref 3.5–5.1)
Sodium: 137 mmol/L (ref 135–145)

## 2016-12-28 NOTE — Progress Notes (Signed)
This is an acute visit.  Level care skilled.  Facility is [facility].  Chief complaint-acute visit follow-up anticoagulation management-anemia-UTI   History of present illness.  Patient is a pleasant 71 year old male here for rehabilitation after sustaining a right hip fracture that was surgically repaired.  He had just finished a four-week course of DVT prophylaxis with Coumadin-when he started to develop right knee swelling-she was found to have a distal femoral DVT.  His Coumadin was restarted on an aggressive dose of of 7.5 mg of Coumadin-the next day apparently a dose was missed-the next 2 days his Coumadin was increased to 10 mg a day  Most recent INR on March 2 was 1.19  He is on Keflex for a Klebsiella pneumonia UTI-she did spike a temperature of 101 on March 1-he has been afebrile currently appears to be tolerating this well-and at times he will run a low-grade temperature in the 99 but this is not totally new.  He does have a history of anemia of chronic disease this was complicated with acute blood loss anemia after surgery.  Initially recommendation was to keep his goal INR around 1.5 for prophylaxis.  In regards to anemia he is on iron and apparently receives Procrit at dialysis.  Lab done today shows a hemoglobin of 8.0 this appears to be down from a hemoglobin of 10.4--4days ago and 9.6 2 days ago.  We did test his stool for occult bleeding and appeared to be negative today.  Currently he is sitting in his wheelchair comfortably appears to be at his baseline he has somewhat low blood pressures but this is not new manually today was 98/56.  He is not complaining any dizziness headache-appears to be doing well and at his baseline    They've attempted several times to obtain blood work for an INR today but have not been successful.  Past Medical History:  Diagnosis Date  . Abnormal CT scan, kidney 10/06/2011  . Acute pyelonephritis 10/07/2011  . Anemia      normocytic  . Anxiety    mental retardation  . Bladder wall thickening 10/06/2011  . BPH (benign prostatic hypertrophy)   . Diabetes mellitus   . Edema     history of lower extremity edema  . Heme positive stool   . Hydronephrosis   . Hyperkalemia   . Hyperlipidemia   . Hypernatremia   . Hypertension   . Hypothyroidism   . Impaired speech   . 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 MR (mental retardation)   . Obstructive uropathy   . 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 Poor historian poor historian  . Protein calorie malnutrition ([place])   . Pyelonephritis   . Renal failure (ARF), acute on chronic (HCC)   . Renal insufficiency    chronic history  . Smoking   . Uremia   . Urinary retention   . UTI (lower urinary tract infection) 10/06/2011  . UTI (lower urinary tract infection)         Past Surgical History:  Procedure Laterality Date  . AV FISTULA PLACEMENT Left 07/06/2015   Procedure:  INSERTION LEFT ARM ARTERIOVENOUS GORTEX GRAFT;  Surgeon: Angelia Mould, MD;  Location: [place];  Service: Vascular;  Laterality: Left;  . AV FISTULA PLACEMENT Right 02/26/2016   Procedure: ARTERIOVENOUS (AV) FISTULA CREATION ;  Surgeon: Angelia Mould, MD;  Location: [place];  Service: Vascular;  Laterality: Right;  . [place] REMOVAL Left 10/09/2015   Procedure: REMOVAL OF ARTERIOVENOUS  GORETEX GRAFT ([place]) Evacuation of Lymphocele, Vein Patch angioplasty of brachial artery.;  Surgeon: Angelia Mould, MD;  Location: [place];  Service: Vascular;  Laterality: Left;  . BASCILIC VEIN TRANSPOSITION Right 02/26/2016   Procedure: Right BASCILIC VEIN TRANSPOSITION;  Surgeon: Angelia Mould, MD;  Location: [place];  Service: Vascular;  Laterality: Right;  . CIRCUMCISION N/A 01/04/2014   Procedure: CIRCUMCISION ADULT (procedure #1);  Surgeon: Marissa Nestle, MD;  Location: [facility];  Service: Urology;  Laterality: N/A;  . CYSTOSCOPY W/ RETROGRADES  Bilateral 06/29/2015   Procedure: CYSTOSCOPY, DILATION OF URETHRAL STRICTURE WITH BILATERAL RETROGRADE PYELOGRAM,SUPRAPUBIC TUBE CHANGE;  Surgeon: Festus Aloe, MD;  Location: [facility];  Service: Urology;  Laterality: Bilateral;  . CYSTOSCOPY WITH URETHRAL DILATATION N/A 12/29/2013   Procedure: CYSTOSCOPY WITH URETHRAL DILATATION;  Surgeon: Marissa Nestle, MD;  Location: [facility];  Service: Urology;  Laterality: N/A;  . ORIF FEMUR FRACTURE Right 11/22/2016   Procedure: OPEN REDUCTION INTERNAL FIXATION (ORIF) DISTAL FEMUR FRACTURE;  Surgeon: Rod Can, MD;  Location: [place];  Service: Orthopedics;  Laterality: Right;  . PERIPHERAL VASCULAR CATHETERIZATION N/A 10/08/2015   Procedure: A/V Shuntogram;  Surgeon: Angelia Mould, MD;  Location: [facility];  Service: Cardiovascular;  Laterality: N/A;  . TRANSURETHRAL RESECTION OF PROSTATE N/A 01/04/2014   Procedure: TRANSURETHRAL RESECTION OF THE PROSTATE (TURP) (procedure #2);  Surgeon: Marissa Nestle, MD;  Location: [facility];  Service: Urology;  Laterality: N/A;    No Known Allergies        Current Outpatient Prescriptions on File Prior to Visit  Medication Sig Dispense Refill  . atorvastatin (LIPITOR) 40 MG tablet Take 40 mg by mouth every evening.    . carvedilol (COREG) 6.25 MG tablet Take 1 tablet (6.25 mg total) by mouth 2 (two) times daily with a meal. 60 tablet 3  . epoetin alfa (EPOGEN,PROCRIT) 4000 UNIT/ML injection Inject 4,000 Units into the vein every 14 (fourteen) days.    . ferrous sulfate 325 (65 FE) MG tablet Take 325 mg by mouth 2 (two) times daily with a meal.     . levothyroxine (SYNTHROID, LEVOTHROID) 75 MCG tablet Take 75 mcg by mouth daily before breakfast.     . linagliptin (TRADJENTA) 5 MG TABS tablet Take 2.5 mg by mouth daily.     . Multiple Vitamin (MULTIVITAMIN WITH MINERALS) TABS tablet Take 1 tablet by mouth daily.    . Neo-Bacit-Poly-Lidocaine (FIRST AID PLUS LIDOCAINE EX) Apply 1  application topically 3 (three) times a week. Prior to dialysis    . omeprazole (PRILOSEC) 20 MG capsule Take 40 mg by mouth daily.    . ondansetron (ZOFRAN) 4 MG tablet Take 4 mg by mouth every 8 (eight) hours as needed for nausea or vomiting.    . tamsulosin (FLOMAX) 0.4 MG CAPS capsule Take 0.4 mg by mouth daily.    Marland Kitchen torsemide (DEMADEX) 10 MG tablet Take 5 tablets (50 mg total) by mouth daily. 30 tablet 3  . Vitamin D, Ergocalciferol, (DRISDOL) 50000 UNITS CAPS capsule Take 1 capsule (50,000 Units total) by mouth every 7 (seven) days. 30 capsule 3  OF NOTE  CURRENTLY ON COUMADIN 10 MG A DAY No current facility-administered medications on file prior to visit.      Review of Systems  Constitutional: Negative for activity change, appetite change, chills, diaphoresis, fatigue and fever.  HENT: Negative for mouth sores, postnasal drip, rhinorrhea, sinus pain and sore throat.  Respiratory: Negative for apnea, cough, chest  tightness, shortness of breath and wheezing.  Cardiovascular: Negative for chest pain, palpitations and leg swelling. Except for edema of the right knee Gastrointestinal: Negative for abdominal distention, abdominal pain, constipation, diarrhea, nausea and vomiting.  Genitourinary: Negative for dysuria and frequency. Has finished treatment for UTI Musculoskeletal: Positive for edema of his right knee-this is slightly tender but not acutely painful Skin: Negative for rash. Does have a small abrasion under his right knee Neurological: Negative for dizziness, syncope, weakness, light-headedness and numbness.  Psychiatric/Behavioral: Negative for behavioral problems, confusion and sleep disturbance.        Immunization History  Administered Date(s) Administered  . Influenza,inj,Quad PF,36+ Mos 12/26/2013, 07/09/2015  . PPD Test 03/07/2015  . Pneumococcal Polysaccharide-23 03/04/2015       Pertinent  Health Maintenance Due  Topic Date Due  . FOOT EXAM   08/23/1956  . OPHTHALMOLOGY EXAM  [birth year]/1958  . URINE MICROALBUMIN  08/[birth year]/1958  . COLONOSCOPY  08/23/1998  . INFLUENZA VACCINE  09/26/2017 (Originally 05/27/2016)  . PNA vac Low Risk Adult (2 of 2 - PCV13) 09/26/2017 (Originally 03/03/2016)  . HEMOGLOBIN A1C  05/20/2017   No flowsheet data found. Functional Status Survey:  He is afebrile pulse 90 respirations 20 blood pressure 98/56 Physical Exam  . In general this is a middle-aged malesitting comfortably in his wheelchair  His skin is warm and dry       Eyes pupils appear reactive to light visual acuity appears grossly intact.  Oropharynx Is clear mucous membranes moist  Chest is clear to auscultation with somewhat poor respiratory effort-there is no labored breathing.   heart is regular rate and rhythm with apical pulse in the 90s  Abdomen is soft does not appear to be tender there are positive bowel sounds .  GU could not appreciate any suprapubic distention   Rectal exam-stool occult blood testing appear to be negative o   Muscleskeletal does move all extremities 4--ambulatory inwheelchair Right knee does have edema when DVT was diagnosed But less dramatic than initial presentation when the DVT was diagnosed   .  Neurologic is grossly intact speech is clear   Psych he is pleasant and cooperative with exam pleasant smiling more talkative than when he first came to facility  Labs reviewed:  12/28/2016.  WBC 9.9 hemoglobin 8.0 platelets 344  Sodium 137 potassium 3.8 BUN 32 creatinine 4.25.  CO2 level XXVII  Recent Labs (within last 365 days)   Recent Labs  11/24/16 0339 11/25/16 0428 11/26/16 0613 11/28/16 1400 12/20/16 0945  NA 136 136 136 138 141  K 5.6* 4.8 4.5 4.0 3.9  CL 100* 97* 97* 97* 103  CO2 23 25 24 24 28   GLUCOSE 91 112* 88 123* 128*  BUN 55* 41* 64* 47* 41*  CREATININE 7.68* 6.47* 8.80* 5.89* 6.20*  CALCIUM 8.3* 8.7* 8.3* 8.9 8.9  PHOS 6.9* 6.7* 8.7*  --   --        Recent Labs (within last 365 days)   Recent Labs  04/03/16 0935  11/20/16 1446  11/25/16 0428 11/26/16 0613 11/28/16 1400  AST 13*  --  27  --   --   --  27  ALT 12*  --  33  --   --   --  <5*  ALKPHOS 66  --  77  --   --   --  68  BILITOT 0.4  --  0.4  --   --   --  0.9  PROT 6.0*  --  7.1  --   --   --  7.6  ALBUMIN 3.0*  < > 3.7  < > 2.6* 2.3* 2.9*  < > = values in this interval not displayed.    Recent Labs (within last 365 days)   Recent Labs  11/20/16 1446  11/28/16 1400 12/04/16 0700 12/20/16 0945  WBC 8.8  < > 13.7* 10.6* 8.4  NEUTROABS 6.4  --  9.7*  --  5.8  HGB 9.0*  < > 9.9* 8.1* 9.5*  HCT 28.6*  < > 30.9* 24.7* 29.8*  MCV 110.9*  < > 100.0 97.6 100.3*  PLT 176  < > 323 317 322  < > = values in this interval not displayed.   Recent Labs       Lab Results  Component Value Date   TSH 1.071 04/03/2016     Recent Labs       Lab Results  Component Value Date   HGBA1C 5.0 11/20/2016     Recent Labs  No results found for: CHOL, HDL, LDLCALC, LDLDIRECT, TRIG, CHOLHDL    Significant Diagnostic Results in last 30 days:  Imaging Results  No results found.    Assessment and plan.  #1 right lower extremity DVT-this has been somewhat challenging with anticoagulation with his history of dialysis and anemia-he is on Coumadin currently 10 mg a day with attempted to draw an INR today but have not had success several times-this will need to be rechecked tomorrow expediently continue to monitor.--We will attempt to contact nephrologist tomorrow--- if  INR  Does not rise--e for further recommendations on possible alternatives to Coumadin.  This   #2 in regards to anemia hemoglobin is 8.0 this appears to be dropping somewhat from several days ago-this will have to be watched-will update this in 2 days.--Notify dialysis of labs as well Continue to guaiac stools 3  #3 UTI appears to be making an unremarkable recovery from this continues on  Keflex this appears to be stable he is currently afebrile.  [id number]

## 2016-12-29 ENCOUNTER — Encounter (HOSPITAL_COMMUNITY)
Admission: RE | Admit: 2016-12-29 | Discharge: 2016-12-29 | Disposition: A | Discharge status: Home / Self Care | Payer: Medicare Other | Source: Skilled Nursing Facility | Attending: Internal Medicine | Admitting: Internal Medicine

## 2016-12-29 ENCOUNTER — Non-Acute Institutional Stay (SKILLED_NURSING_FACILITY): Payer: Medicare Other | Admitting: Internal Medicine

## 2016-12-29 ENCOUNTER — Encounter: Payer: Self-pay | Admitting: Internal Medicine

## 2016-12-29 DIAGNOSIS — Z5181 Encounter for therapeutic drug level monitoring: Secondary | ICD-10-CM

## 2016-12-29 DIAGNOSIS — I82401 Acute embolism and thrombosis of unspecified deep veins of right lower extremity: Secondary | ICD-10-CM

## 2016-12-29 DIAGNOSIS — Z87440 Personal history of urinary (tract) infections: Secondary | ICD-10-CM | POA: Diagnosis not present

## 2016-12-29 DIAGNOSIS — Z7901 Long term (current) use of anticoagulants: Secondary | ICD-10-CM

## 2016-12-29 LAB — URINE CULTURE: Culture: NO GROWTH

## 2016-12-29 LAB — PROTIME-INR
INR: 2.57
Prothrombin Time: 28.1 seconds — ABNORMAL HIGH (ref 11.4–15.2)

## 2016-12-29 NOTE — Progress Notes (Signed)
Location:   [facility] Room Number: 112/D Place of Service:  SNF (31) Provider:  Glennon Hamilton, MD  Patient Care Team: [name], MD as PCP - General (Internal Medicine) Cassandria Anger, MD as Consulting Physician (Endocrinology) Fran Lowes, MD as Consulting Physician (Nephrology)  Extended Emergency Contact Information Primary Emergency Contact: Spell,Malinda Address: [address]  [place] Phone: [phone] Work Phone: [phone] Relation: Legal Guardian Secondary Emergency Contact: Giesler,Richard Address: [address]          [place], [place] [address] [place] Phone: [phone] Relation: Brother  Code Status:  Full Code Goals of care: Advanced Directive information Advanced Directives 12/29/2016  Does Patient Have a Medical Advance Directive? Yes  Type of Advance Directive (No Data)  Does patient want to make changes to medical advance directive? No - Patient declined  Copy of [place] in Chart? -  Would patient like information on creating a medical advance directive? No - Patient declined  Pre-existing out of facility DNR order (yellow form or pink MOST form) -     Chief Complaint  Patient presents with  . Acute Visit    F/U  Anticoagulation management-also UTI-  HPI:  Pt is a 71 y.o. male seen today for an acute visit for anticoagulation management with history of recent right leg DVT  Late last week patient developed fairly sudden onset of right knee edema-venous Doppler did show a distal femoral DVT.  His Coumadin was restarted he had been on a previously for DVT prophylaxis for 4 weeks after hip surgery.  Adjusting his Coumadin dose has been quite challenging given for DVT prophylaxis.  Back on March 2 his INR was only 1.19.  He was initially started some 7..5 mg Coumadin apparently dose was missed and his Coumadin was increased to 10 mg a  day.  We attempted to draw an INR yesterday apparently multiple attempts were not successful self INR was successfully done this morning and it is 2.57.  He continues on Keflex for suspected UTI.--This was the result of the ER visit last Friday when urinalysis suggested UTI-it appears however the culture hass not grown out anything significant  per results obtained today.  Originally he was treated back in mid February for Klebsiella UTI I think there were suspicions this was the case on this time as well but again final culture has not really grown out any specific organism.  He has been afebrile does not really complaining of any back pain dysuria  Cough congestion he does have end-stage renal disease and is on dialysis.  Will also notice hemoglobin is at the lower end of his baseline at 8.0 yesterday-we did guaiac his stools yesterday this appeared to be negative-will continue to guaiac his stools and an update hemoglobin is pending for tomorrow.  His white count has not been elevated       Past Medical History:  Diagnosis Date  . Abnormal CT scan, kidney 10/06/2011  . Acute pyelonephritis 10/07/2011  . Anemia    normocytic  . Anxiety    mental retardation  . Bladder wall thickening 10/06/2011  . BPH (benign prostatic hypertrophy)   . Diabetes mellitus   . Edema     history of lower extremity edema  . Heme positive stool   . Hydronephrosis   . Hyperkalemia   . Hyperlipidemia   . Hypernatremia   . Hypertension   . Hypothyroidism   . Impaired  speech   . 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 MR (mental retardation)   . Obstructive uropathy   . 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 Poor historian poor historian  . Protein calorie malnutrition ([place])   . Pyelonephritis   . Renal failure (ARF), acute on chronic (HCC)   . Renal insufficiency    chronic history  . Smoking   . Uremia   . Urinary retention   . UTI (lower urinary tract infection) 10/06/2011  . UTI  (lower urinary tract infection)    Past Surgical History:  Procedure Laterality Date  . AV FISTULA PLACEMENT Left 07/06/2015   Procedure:  INSERTION LEFT ARM ARTERIOVENOUS GORTEX GRAFT;  Surgeon: Angelia Mould, MD;  Location: [facility];  Service: Vascular;  Laterality: Left;  . AV FISTULA PLACEMENT Right 02/26/2016   Procedure: ARTERIOVENOUS (AV) FISTULA CREATION ;  Surgeon: Angelia Mould, MD;  Location: [facility];  Service: Vascular;  Laterality: Right;  . [place] REMOVAL Left 10/09/2015   Procedure: REMOVAL OF ARTERIOVENOUS GORETEX GRAFT ([place]) Evacuation of Lymphocele, Vein Patch angioplasty of brachial artery.;  Surgeon: Angelia Mould, MD;  Location: [facility];  Service: Vascular;  Laterality: Left;  . BASCILIC VEIN TRANSPOSITION Right 02/26/2016   Procedure: Right BASCILIC VEIN TRANSPOSITION;  Surgeon: Angelia Mould, MD;  Location: [facility];  Service: Vascular;  Laterality: Right;  . CIRCUMCISION N/A 01/04/2014   Procedure: CIRCUMCISION ADULT (procedure #1);  Surgeon: Marissa Nestle, MD;  Location: [facility];  Service: Urology;  Laterality: N/A;  . CYSTOSCOPY W/ RETROGRADES Bilateral 06/29/2015   Procedure: CYSTOSCOPY, DILATION OF URETHRAL STRICTURE WITH BILATERAL RETROGRADE PYELOGRAM,SUPRAPUBIC TUBE CHANGE;  Surgeon: Festus Aloe, MD;  Location: [facility];  Service: Urology;  Laterality: Bilateral;  . CYSTOSCOPY WITH URETHRAL DILATATION N/A 12/29/2013   Procedure: CYSTOSCOPY WITH URETHRAL DILATATION;  Surgeon: Marissa Nestle, MD;  Location: [facility];  Service: Urology;  Laterality: N/A;  . ORIF FEMUR FRACTURE Right 11/22/2016   Procedure: OPEN REDUCTION INTERNAL FIXATION (ORIF) DISTAL FEMUR FRACTURE;  Surgeon: Rod Can, MD;  Location: [facility];  Service: Orthopedics;  Laterality: Right;  . PERIPHERAL VASCULAR CATHETERIZATION N/A 10/08/2015   Procedure: A/V Shuntogram;  Surgeon: Angelia Mould, MD;  Location: [facility];  Service: Cardiovascular;  Laterality: N/A;  .  TRANSURETHRAL RESECTION OF PROSTATE N/A 01/04/2014   Procedure: TRANSURETHRAL RESECTION OF THE PROSTATE (TURP) (procedure #2);  Surgeon: Marissa Nestle, MD;  Location: [facility];  Service: Urology;  Laterality: N/A;    No Known Allergies  Allergies as of 12/29/2016   No Known Allergies     Medication List       Accurate as of 12/29/16 11:42 AM. Always use your most recent med list.          atorvastatin 40 MG tablet Commonly known as:  LIPITOR Take 40 mg by mouth every evening.   carvedilol 6.25 MG tablet Commonly known as:  COREG Take 1 tablet (6.25 mg total) by mouth 2 (two) times daily with a meal.   cephALEXin 500 MG capsule Commonly known as:  KEFLEX Take 1 capsule (500 mg total) by mouth 4 (four) times daily.   epoetin alfa 4000 UNIT/ML injection Commonly known as:  EPOGEN,PROCRIT Inject 4,000 Units into the vein every 14 (fourteen) days.   feeding supplement (PRO-STAT 64) Liqd Take 30 mLs by mouth 2 (two) times daily between meals.   ferrous sulfate 325 (65 FE) MG tablet Take 325 mg by mouth 2 (two)  times daily with a meal.   FIRST AID PLUS LIDOCAINE EX Apply 1 application topically 3 (three) times a week. Prior to dialysis   levothyroxine 75 MCG tablet Commonly known as:  SYNTHROID, LEVOTHROID Take 75 mcg by mouth daily before breakfast.   linagliptin 5 MG Tabs tablet Commonly known as:  TRADJENTA Take 2.5 mg by mouth every evening.   multivitamin with minerals Tabs tablet Take 1 tablet by mouth every evening.   omeprazole 20 MG capsule Commonly known as:  PRILOSEC Take 40 mg by mouth every evening.   ondansetron 4 MG tablet Commonly known as:  ZOFRAN Take 4 mg by mouth every 8 (eight) hours as needed for nausea or vomiting.   tamsulosin 0.4 MG Caps capsule Commonly known as:  FLOMAX Take 0.4 mg by mouth daily.   torsemide 10 MG tablet Commonly known as:  DEMADEX Take 5 tablets (50 mg total) by mouth daily.   Vitamin D (Ergocalciferol) 50000  units Caps capsule Commonly known as:  DRISDOL Take 1 capsule (50,000 Units total) by mouth every 7 (seven) days.   warfarin 10 MG tablet Commonly known as:  COUMADIN Take 10 mg by mouth daily.       Review of Systems   This is somewhat limited since patient is a poor story.  General is not complaining of any fever or chills has somewhat of an intermittent appetite depending on if he likes the food  Skin does not complain of a rashes or itching. Preoperative head ears eyes nose mouth and throat no complaint of visual changes or sore throat.  Cardiac does not complain of any chest pain.  GI is not complaining of abdominal discomfort nausea vomiting diarrhea constipation.  GU does not complain of dysuria or back pain he is end-stage renal disease on dialysis.  Muscle skeletal does not really complain of joint pain is not really complaining of right knee pain today.  Neurologic is not complaining of dizziness headache or syncope.  In psych does have a history of mild mental retardation no behaviors have been noted staff has not noted any increased depressive or anxiety symptoms   Immunization History  Administered Date(s) Administered  . Influenza,inj,Quad PF,36+ Mos 12/26/2013, 07/09/2015  . PPD Test 03/07/2015  . Pneumococcal Polysaccharide-23 03/04/2015   Pertinent  Health Maintenance Due  Topic Date Due  . FOOT EXAM  08/23/1958  . OPHTHALMOLOGY EXAM  08/23/1958  . URINE MICROALBUMIN  08/23/1958  . COLONOSCOPY  08/23/1998  . INFLUENZA VACCINE  09/26/2017 (Originally 05/27/2016)  . PNA vac Low Risk Adult (2 of 2 - PCV13) 09/26/2017 (Originally 03/03/2016)  . HEMOGLOBIN A1C  05/20/2017   No flowsheet data found. Functional Status Survey:    Vitals:   12/29/16 1141  BP: (!) 90/54  Pulse: 95  Resp: 17  Temp: 98.9 F (37.2 C)  TempSrc: Oral  SpO2: 95%    Physical Exam   Physical Exam  . In general this is a middle-aged malesitting comfortably in his  wheelchair  His skin is warm and dry       Eyes pupils appear reactive to light visual acuity appears grossly intact.  Oropharynx Is clear mucous membranes moist  Chest is clear to auscultation with somewhat poor respiratory effort-there is no labored breathing.  heart is regular rate and rhythm with apical pulse in the highn90's  Abdomen is soft does not appear to be tender there are positive bowel sounds .  GU could not appreciate any suprapubic distention  Rectal exam-stool occult blood testing appear to be negative o   Muscleskeletal does move all extremities 4--ambulatory inwheelchair Right knee does have edema when DVT was diagnosed But this appears to be gradually decreasing   .  Neurologic is grossly intact speech is clear   Psych he is pleasant and cooperative with exam pleasant smiling more talkative than when he first came to facility   Labs reviewed:  Recent Labs  11/24/16 0339 11/25/16 0428 11/26/16 0613  12/24/16 1150 12/26/16 0922 12/28/16 0647  NA 136 136 136  < > 135 136 137  K 5.6* 4.8 4.5  < > 4.2 3.7 3.8  CL 100* 97* 97*  < > 97* 97* 100*  CO2 23 25 24   < > 25 30 27   GLUCOSE 91 112* 88  < > 142* 118* 93  BUN 55* 41* 64*  < > 47* 38* 32*  CREATININE 7.68* 6.47* 8.80*  < > 5.26* 4.77* 4.25*  CALCIUM 8.3* 8.7* 8.3*  < > 9.1 8.7* 8.7*  PHOS 6.9* 6.7* 8.7*  --   --   --   --   < > = values in this interval not displayed.  Recent Labs  04/03/16 0935  11/20/16 1446  11/25/16 0428 11/26/16 0613 11/28/16 1400  AST 13*  --  27  --   --   --  27  ALT 12*  --  33  --   --   --  <5*  ALKPHOS 66  --  77  --   --   --  68  BILITOT 0.4  --  0.4  --   --   --  0.9  PROT 6.0*  --  7.1  --   --   --  7.6  ALBUMIN 3.0*  < > 3.7  < > 2.6* 2.3* 2.9*  < > = values in this interval not displayed.  Recent Labs  12/20/16 0945 12/24/16 1150 12/26/16 0922 12/28/16 0647  WBC 8.4 10.4 8.0 9.9  NEUTROABS 5.8  --  4.8 6.3  HGB  9.5* 10.4* 9.6* 8.0*  HCT 29.8* 32.2* 29.9* 24.8*  MCV 100.3* 99.7 99.0 99.6  PLT 322 313 267 344   Lab Results  Component Value Date   TSH 1.071 04/03/2016   Lab Results  Component Value Date   HGBA1C 5.0 11/20/2016   No results found for: CHOL, HDL, LDLCALC, LDLDIRECT, TRIG, CHOLHDL  Significant Diagnostic Results in last 30 days:  Dg Chest 2 View  Result Date: 12/26/2016 CLINICAL DATA:  Fevers EXAM: CHEST  2 VIEW COMPARISON:  11/20/2016 FINDINGS: The heart size and mediastinal contours are within normal limits. Both lungs are clear. The visualized skeletal structures are unremarkable. IMPRESSION: No active cardiopulmonary disease. Electronically Signed   By: Inez Catalina M.D.   On: 12/26/2016 10:50   US Venous Img Lower Unilateral Right  Result Date: 12/24/2016 CLINICAL DATA:  Right lower extremity edema over the last day. EXAM: Right LOWER EXTREMITY VENOUS DOPPLER ULTRASOUND TECHNIQUE: Gray-scale sonography with graded compression, as well as color Doppler and duplex ultrasound were performed to evaluate the lower extremity deep venous systems from the level of the common femoral vein and including the common femoral, femoral, profunda femoral, popliteal and calf veins including the posterior tibial, peroneal and gastrocnemius veins when visible. The superficial great saphenous vein was also interrogated. Spectral Doppler was utilized to evaluate flow at rest and with distal augmentation maneuvers in the common femoral, femoral and popliteal veins. COMPARISON:  None. FINDINGS: Contralateral Common Femoral Vein: Respiratory phasicity is normal and symmetric with the symptomatic side. No evidence of thrombus. Normal compressibility. Common Femoral Vein: Normal proximally. Thrombus present in the distal femoral vein. This is nonocclusive. Saphenofemoral Junction: No evidence of thrombus. Normal compressibility and flow on color Doppler imaging. Profunda Femoral Vein: No evidence of thrombus.  Normal compressibility and flow on color Doppler imaging. Popliteal Vein: No evidence of thrombus. Normal compressibility, respiratory phasicity and response to augmentation. Calf Veins: No evidence of thrombus. Normal compressibility and flow on color Doppler imaging. Superficial Great Saphenous Vein: No evidence of thrombus. Normal compressibility and flow on color Doppler imaging. Venous Reflux:  None. Other Findings:  None. IMPRESSION: Deep venous thrombosis of the distal femoral vein on the right. This is nonocclusive. Electronically Signed   By: Nelson Chimes M.D.   On: 12/24/2016 15:32    Assessment/Plan #1 anticoagulation management with history of recently diagnosed right leg DVT---- INR is 2.57--INR is a bit higher than we would like it-it is grossly within therapeutic range for an acute DVT-we will decrease Coumadin dose down to 5 mg a day and recheck INR tomorrow-this was discussed with Dr. Dellia Nims via phone   #2 history of chronic anemia hemoglobin of 8.00 the lower end of his baseline Will continue to guaiac stools again guaic  yesterday was negative.  #3 UTI? Culture actually is shown no growth-will discontinue the Keflex and monitor he has been afebrile white count has been normal we do have an updated CBC scheduled for tomorrow for follow-up Nursing staff patient did not report any fever cough congestion complaints of further dysuria but this will have to be monitored.  [id number]

## 2016-12-30 ENCOUNTER — Encounter (HOSPITAL_COMMUNITY)
Admission: RE | Admit: 2016-12-30 | Discharge: 2016-12-30 | Disposition: A | Discharge status: Home / Self Care | Payer: Medicare Other | Source: Skilled Nursing Facility | Attending: Internal Medicine | Admitting: Internal Medicine

## 2016-12-30 DIAGNOSIS — N2581 Secondary hyperparathyroidism of renal origin: Secondary | ICD-10-CM | POA: Diagnosis not present

## 2016-12-30 DIAGNOSIS — D631 Anemia in chronic kidney disease: Secondary | ICD-10-CM | POA: Diagnosis not present

## 2016-12-30 DIAGNOSIS — N186 End stage renal disease: Secondary | ICD-10-CM | POA: Diagnosis not present

## 2016-12-30 DIAGNOSIS — Z992 Dependence on renal dialysis: Secondary | ICD-10-CM | POA: Diagnosis not present

## 2016-12-30 DIAGNOSIS — D509 Iron deficiency anemia, unspecified: Secondary | ICD-10-CM | POA: Diagnosis not present

## 2016-12-30 DIAGNOSIS — E162 Hypoglycemia, unspecified: Secondary | ICD-10-CM | POA: Diagnosis not present

## 2016-12-30 LAB — CBC WITH DIFFERENTIAL/PLATELET
Basophils Absolute: 0 10*3/uL (ref 0.0–0.1)
Basophils Relative: 0 %
Eosinophils Absolute: 0.1 10*3/uL (ref 0.0–0.7)
Eosinophils Relative: 2 %
HCT: 27.6 % — ABNORMAL LOW (ref 39.0–52.0)
Hemoglobin: 8.9 g/dL — ABNORMAL LOW (ref 13.0–17.0)
Lymphocytes Relative: 20 %
Lymphs Abs: 1.4 10*3/uL (ref 0.7–4.0)
MCH: 31.8 pg (ref 26.0–34.0)
MCHC: 32.2 g/dL (ref 30.0–36.0)
MCV: 98.6 fL (ref 78.0–100.0)
Monocytes Absolute: 0.8 10*3/uL (ref 0.1–1.0)
Monocytes Relative: 11 %
Neutro Abs: 4.9 10*3/uL (ref 1.7–7.7)
Neutrophils Relative %: 67 %
Platelets: 305 10*3/uL (ref 150–400)
RBC: 2.8 MIL/uL — ABNORMAL LOW (ref 4.22–5.81)
RDW: 17.8 % — ABNORMAL HIGH (ref 11.5–15.5)
WBC: 7.3 10*3/uL (ref 4.0–10.5)

## 2016-12-30 LAB — PROTIME-INR
INR: 3.1
Prothrombin Time: 32.6 seconds — ABNORMAL HIGH (ref 11.4–15.2)

## 2016-12-31 ENCOUNTER — Encounter (HOSPITAL_COMMUNITY)
Admission: RE | Admit: 2016-12-31 | Discharge: 2016-12-31 | Disposition: A | Discharge status: Home / Self Care | Payer: Medicare Other | Source: Skilled Nursing Facility | Attending: Internal Medicine | Admitting: Internal Medicine

## 2016-12-31 LAB — PROTIME-INR
INR: 2.42
Prothrombin Time: 26.7 seconds — ABNORMAL HIGH (ref 11.4–15.2)

## 2017-01-01 ENCOUNTER — Encounter (HOSPITAL_COMMUNITY)
Admission: RE | Admit: 2017-01-01 | Discharge: 2017-01-01 | Disposition: A | Discharge status: Home / Self Care | Payer: Medicare Other | Source: Skilled Nursing Facility | Attending: Internal Medicine | Admitting: Internal Medicine

## 2017-01-01 DIAGNOSIS — D509 Iron deficiency anemia, unspecified: Secondary | ICD-10-CM | POA: Diagnosis not present

## 2017-01-01 DIAGNOSIS — N2581 Secondary hyperparathyroidism of renal origin: Secondary | ICD-10-CM | POA: Diagnosis not present

## 2017-01-01 DIAGNOSIS — Z992 Dependence on renal dialysis: Secondary | ICD-10-CM | POA: Diagnosis not present

## 2017-01-01 DIAGNOSIS — D631 Anemia in chronic kidney disease: Secondary | ICD-10-CM | POA: Diagnosis not present

## 2017-01-01 DIAGNOSIS — N186 End stage renal disease: Secondary | ICD-10-CM | POA: Diagnosis not present

## 2017-01-01 DIAGNOSIS — E162 Hypoglycemia, unspecified: Secondary | ICD-10-CM | POA: Diagnosis not present

## 2017-01-01 LAB — PROTIME-INR
INR: 2.42
Prothrombin Time: 26.8 seconds — ABNORMAL HIGH (ref 11.4–15.2)

## 2017-01-01 LAB — OCCULT BLOOD X 1 CARD TO LAB, STOOL: Fecal Occult Bld: NEGATIVE

## 2017-01-03 ENCOUNTER — Encounter (HOSPITAL_COMMUNITY)
Admission: RE | Admit: 2017-01-03 | Discharge: 2017-01-03 | Disposition: A | Discharge status: Home / Self Care | Payer: Medicare Other | Attending: Pediatrics | Admitting: Pediatrics

## 2017-01-03 DIAGNOSIS — D631 Anemia in chronic kidney disease: Secondary | ICD-10-CM | POA: Diagnosis not present

## 2017-01-03 DIAGNOSIS — D509 Iron deficiency anemia, unspecified: Secondary | ICD-10-CM | POA: Diagnosis not present

## 2017-01-03 DIAGNOSIS — N2581 Secondary hyperparathyroidism of renal origin: Secondary | ICD-10-CM | POA: Diagnosis not present

## 2017-01-03 DIAGNOSIS — E162 Hypoglycemia, unspecified: Secondary | ICD-10-CM | POA: Diagnosis not present

## 2017-01-03 DIAGNOSIS — Z992 Dependence on renal dialysis: Secondary | ICD-10-CM | POA: Diagnosis not present

## 2017-01-03 DIAGNOSIS — N186 End stage renal disease: Secondary | ICD-10-CM | POA: Diagnosis not present

## 2017-01-03 LAB — PROTIME-INR
INR: 3.26
Prothrombin Time: 34 seconds — ABNORMAL HIGH (ref 11.4–15.2)

## 2017-01-04 ENCOUNTER — Encounter (HOSPITAL_COMMUNITY)
Admission: RE | Admit: 2017-01-04 | Discharge: 2017-01-04 | Disposition: A | Discharge status: Home / Self Care | Payer: Medicare Other | Source: Skilled Nursing Facility | Attending: Internal Medicine | Admitting: Internal Medicine

## 2017-01-04 ENCOUNTER — Encounter (HOSPITAL_COMMUNITY)
Admission: RE | Admit: 2017-01-04 | Discharge: 2017-01-04 | Disposition: A | Discharge status: Home / Self Care | Payer: Medicare Other

## 2017-01-04 DIAGNOSIS — Z7901 Long term (current) use of anticoagulants: Secondary | ICD-10-CM | POA: Insufficient documentation

## 2017-01-04 LAB — CBC WITH DIFFERENTIAL/PLATELET
Basophils Absolute: 0 10*3/uL (ref 0.0–0.1)
Basophils Relative: 0 %
Eosinophils Absolute: 0.2 10*3/uL (ref 0.0–0.7)
Eosinophils Relative: 3 %
HCT: 27.6 % — ABNORMAL LOW (ref 39.0–52.0)
Hemoglobin: 8.9 g/dL — ABNORMAL LOW (ref 13.0–17.0)
Lymphocytes Relative: 28 %
Lymphs Abs: 1.7 10*3/uL (ref 0.7–4.0)
MCH: 32.7 pg (ref 26.0–34.0)
MCHC: 32.2 g/dL (ref 30.0–36.0)
MCV: 101.5 fL — ABNORMAL HIGH (ref 78.0–100.0)
Monocytes Absolute: 0.7 10*3/uL (ref 0.1–1.0)
Monocytes Relative: 11 %
Neutro Abs: 3.6 10*3/uL (ref 1.7–7.7)
Neutrophils Relative %: 58 %
Platelets: 302 10*3/uL (ref 150–400)
RBC: 2.72 MIL/uL — ABNORMAL LOW (ref 4.22–5.81)
RDW: 17.9 % — ABNORMAL HIGH (ref 11.5–15.5)
WBC: 6.1 10*3/uL (ref 4.0–10.5)

## 2017-01-04 LAB — OCCULT BLOOD X 1 CARD TO LAB, STOOL
Fecal Occult Bld: POSITIVE — AB
Fecal Occult Bld: POSITIVE — AB

## 2017-01-04 LAB — BASIC METABOLIC PANEL
Anion gap: 10 (ref 5–15)
BUN: 33 mg/dL — ABNORMAL HIGH (ref 6–20)
CO2: 27 mmol/L (ref 22–32)
Calcium: 8.7 mg/dL — ABNORMAL LOW (ref 8.9–10.3)
Chloride: 106 mmol/L (ref 101–111)
Creatinine, Ser: 3.64 mg/dL — ABNORMAL HIGH (ref 0.61–1.24)
GFR calc Af Amer: 18 mL/min — ABNORMAL LOW (ref >60)
GFR calc non Af Amer: 16 mL/min — ABNORMAL LOW (ref >60)
Glucose, Bld: 197 mg/dL — ABNORMAL HIGH (ref 65–99)
Potassium: 3.4 mmol/L — ABNORMAL LOW (ref 3.5–5.1)
Sodium: 143 mmol/L (ref 135–145)

## 2017-01-04 LAB — PROTIME-INR
INR: 2.93
INR: 2.51
Prothrombin Time: 31.2 seconds — ABNORMAL HIGH (ref 11.4–15.2)
Prothrombin Time: 27.6 seconds — ABNORMAL HIGH (ref 11.4–15.2)

## 2017-01-05 ENCOUNTER — Encounter (HOSPITAL_BASED_OUTPATIENT_CLINIC_OR_DEPARTMENT_OTHER): Payer: Self-pay

## 2017-01-05 ENCOUNTER — Encounter: Payer: Self-pay | Admitting: Internal Medicine

## 2017-01-05 ENCOUNTER — Ambulatory Visit (HOSPITAL_BASED_OUTPATIENT_CLINIC_OR_DEPARTMENT_OTHER): Admit: 2017-01-05 | Payer: Medicare Other | Admitting: Urology

## 2017-01-05 ENCOUNTER — Encounter (HOSPITAL_COMMUNITY)
Admission: RE | Admit: 2017-01-05 | Discharge: 2017-01-05 | Disposition: A | Discharge status: Home / Self Care | Payer: Medicare Other | Source: Skilled Nursing Facility | Attending: Internal Medicine | Admitting: Internal Medicine

## 2017-01-05 ENCOUNTER — Non-Acute Institutional Stay (SKILLED_NURSING_FACILITY): Payer: Medicare Other | Admitting: Internal Medicine

## 2017-01-05 DIAGNOSIS — E1121 Type 2 diabetes mellitus with diabetic nephropathy: Secondary | ICD-10-CM | POA: Diagnosis not present

## 2017-01-05 DIAGNOSIS — R195 Other fecal abnormalities: Secondary | ICD-10-CM | POA: Diagnosis not present

## 2017-01-05 DIAGNOSIS — D638 Anemia in other chronic diseases classified elsewhere: Secondary | ICD-10-CM

## 2017-01-05 DIAGNOSIS — N186 End stage renal disease: Secondary | ICD-10-CM

## 2017-01-05 DIAGNOSIS — S72491A Other fracture of lower end of right femur, initial encounter for closed fracture: Secondary | ICD-10-CM

## 2017-01-05 DIAGNOSIS — I82401 Acute embolism and thrombosis of unspecified deep veins of right lower extremity: Secondary | ICD-10-CM

## 2017-01-05 LAB — PROTIME-INR
INR: 2.54
Prothrombin Time: 27.8 seconds — ABNORMAL HIGH (ref 11.4–15.2)

## 2017-01-05 SURGERY — CYSTOSCOPY WITH INSERTION OF UROLIFT
Anesthesia: General

## 2017-01-05 NOTE — Progress Notes (Signed)
Location:   [facility] Room Number: 112/D Place of Service:  SNF (31) Provider:  Glennon Hamilton, MD  Patient Care Team: [name], MD as PCP - General (Internal Medicine) Cassandria Anger, MD as Consulting Physician (Endocrinology) Fran Lowes, MD as Consulting Physician (Nephrology)  Extended Emergency Contact Information Primary Emergency Contact: Spell,Malinda Address: [address]  [place] Phone: [phone] Work Phone: [phone] Relation: Legal Guardian Secondary Emergency Contact: Vanderlinde,Richard Address: [address]          [place], [place] [address] [place] Phone: [phone] Relation: Brother  Code Status:  Full Code Goals of care: Advanced Directive information Advanced Directives 01/05/2017  Does Patient Have a Medical Advance Directive? Yes  Type of Advance Directive (No Data)  Does patient want to make changes to medical advance directive? No - Patient declined  Copy of [place] in Chart? -  Would patient like information on creating a medical advance directive? No - Patient declined  Pre-existing out of facility DNR order (yellow form or pink MOST form) -    Chief complaint-acute visit secondary to guaiac-positive stool  Also routine visit for medical management of chronic medical issues including end-stage renal disease-history of right hip fracture with repair-diabetes type 2-chronic anemia--history of recent right leg DVT--thyroidism GERD -BPH   HPI: Patient is a pleasant 71 year old male who is here for rehabilitation after sustaining a right hip fracture that was surgically repaired-is as been complicated recently with diagnosis of a right femoral DVT-he was on Coumadin for prophylaxis this had just been discontinued when he developed some edema of his right leg-was found to have the distal femoral DVT has been restarted on Coumadin and INR today is  2.54 there has been some emphasis to keep his INR on the lower end of therapeutic secondary to history of GI bleeding in the past.  We have guaiced  stools and there have been 2 positive results.  Hemoglobin however shown stability at 8.9 most recently.  He does have a history of anemia of chronic renal disease he is end-stage renal disease on dialysis-he is on Epogen at dialysis also continues on iron as well as a PPI.  Currently he has no acute complaints he is a somewhat poor historian with a history of mental retardation appears to be doing well with supportive care here.  Vital signs have been stable he has somewhat chronically low blood pressures most recently 94/53 but he appears to be asymptomatic.  Also has a history of type 2 diabetes but blood sugars appear to be stable largely in the high 90s lower 100s  He does have a history of BPH as well this followed by urology and apparently has a cystoscopy scheduled but this is been laid now secondary to the recent DVT     Past Medical History:  Diagnosis Date  . Abnormal CT scan, kidney 10/06/2011  . Acute pyelonephritis 10/07/2011  . Anemia    normocytic  . Anxiety    mental retardation  . Bladder wall thickening 10/06/2011  . BPH (benign prostatic hypertrophy)   . Diabetes mellitus   . Edema     history of lower extremity edema  . Heme positive stool   . Hydronephrosis   . Hyperkalemia   . Hyperlipidemia   . Hypernatremia   . Hypertension   . Hypothyroidism   . Impaired speech   . 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)   . MR (mental  retardation)   . Obstructive uropathy   . 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 Poor historian poor historian  . Protein calorie malnutrition ([facility])   . Pyelonephritis   . Renal failure (ARF), acute on chronic (HCC)   . Renal insufficiency    chronic history  . Smoking   . Uremia   . Urinary retention   . UTI (lower urinary tract infection) 10/06/2011  . UTI (lower urinary tract  infection)    Past Surgical History:  Procedure Laterality Date  . AV FISTULA PLACEMENT Left 07/06/2015   Procedure:  INSERTION LEFT ARM ARTERIOVENOUS GORTEX GRAFT;  Surgeon: Angelia Mould, MD;  Location: [facility];  Service: Vascular;  Laterality: Left;  . AV FISTULA PLACEMENT Right 02/26/2016   Procedure: ARTERIOVENOUS (AV) FISTULA CREATION ;  Surgeon: Angelia Mould, MD;  Location: [facility];  Service: Vascular;  Laterality: Right;  . [place] REMOVAL Left 10/09/2015   Procedure: REMOVAL OF ARTERIOVENOUS GORETEX GRAFT ([facility]) Evacuation of Lymphocele, Vein Patch angioplasty of brachial artery.;  Surgeon: Angelia Mould, MD;  Location: [facility];  Service: Vascular;  Laterality: Left;  . BASCILIC VEIN TRANSPOSITION Right 02/26/2016   Procedure: Right BASCILIC VEIN TRANSPOSITION;  Surgeon: Angelia Mould, MD;  Location: [facility];  Service: Vascular;  Laterality: Right;  . CIRCUMCISION N/A 01/04/2014   Procedure: CIRCUMCISION ADULT (procedure #1);  Surgeon: Marissa Nestle, MD;  Location: [facility];  Service: Urology;  Laterality: N/A;  . CYSTOSCOPY W/ RETROGRADES Bilateral 06/29/2015   Procedure: CYSTOSCOPY, DILATION OF URETHRAL STRICTURE WITH BILATERAL RETROGRADE PYELOGRAM,SUPRAPUBIC TUBE CHANGE;  Surgeon: Festus Aloe, MD;  Location: [facility];  Service: Urology;  Laterality: Bilateral;  . CYSTOSCOPY WITH URETHRAL DILATATION N/A 12/29/2013   Procedure: CYSTOSCOPY WITH URETHRAL DILATATION;  Surgeon: Marissa Nestle, MD;  Location: [facility];  Service: Urology;  Laterality: N/A;  . ORIF FEMUR FRACTURE Right 11/22/2016   Procedure: OPEN REDUCTION INTERNAL FIXATION (ORIF) DISTAL FEMUR FRACTURE;  Surgeon: Rod Can, MD;  Location: [facility];  Service: Orthopedics;  Laterality: Right;  . PERIPHERAL VASCULAR CATHETERIZATION N/A 10/08/2015   Procedure: A/V Shuntogram;  Surgeon: Angelia Mould, MD;  Location: [facility];  Service: Cardiovascular;  Laterality: N/A;  . TRANSURETHRAL  RESECTION OF PROSTATE N/A 01/04/2014   Procedure: TRANSURETHRAL RESECTION OF THE PROSTATE (TURP) (procedure #2);  Surgeon: Marissa Nestle, MD;  Location: [facility];  Service: Urology;  Laterality: N/A;    No Known Allergies  Current Outpatient Prescriptions on File Prior to Visit  Medication Sig Dispense Refill  . Amino Acids-Protein Hydrolys (FEEDING SUPPLEMENT, PRO-STAT 64,) LIQD Take 30 mLs by mouth 2 (two) times daily between meals.    Marland Kitchen atorvastatin (LIPITOR) 40 MG tablet Take 40 mg by mouth every evening.    . carvedilol (COREG) 6.25 MG tablet Take 1 tablet (6.25 mg total) by mouth 2 (two) times daily with a meal. 60 tablet 3  . epoetin alfa (EPOGEN,PROCRIT) 4000 UNIT/ML injection Inject 4,000 Units into the vein every 14 (fourteen) days.    . ferrous sulfate 325 (65 FE) MG tablet Take 325 mg by mouth 2 (two) times daily with a meal.     . levothyroxine (SYNTHROID, LEVOTHROID) 75 MCG tablet Take 75 mcg by mouth daily before breakfast.     . linagliptin (TRADJENTA) 5 MG TABS tablet Take 2.5 mg by mouth every evening.     . Multiple Vitamin (MULTIVITAMIN WITH MINERALS) TABS tablet Take 1 tablet by mouth every evening.     Marland Kitchen  Neo-Bacit-Poly-Lidocaine (FIRST AID PLUS LIDOCAINE EX) Apply 1 application topically 3 (three) times a week. Prior to dialysis    . omeprazole (PRILOSEC) 20 MG capsule Take 40 mg by mouth every evening.     . ondansetron (ZOFRAN) 4 MG tablet Take 4 mg by mouth every 8 (eight) hours as needed for nausea or vomiting.    . tamsulosin (FLOMAX) 0.4 MG CAPS capsule Take 0.4 mg by mouth daily.    Marland Kitchen torsemide (DEMADEX) 10 MG tablet Take 5 tablets (50 mg total) by mouth daily. 30 tablet 3  . Vitamin D, Ergocalciferol, (DRISDOL) 50000 UNITS CAPS capsule Take 1 capsule (50,000 Units total) by mouth every 7 (seven) days. 30 capsule 3  . warfarin (COUMADIN) 10 MG tablet Take 3.5 mg by mouth daily.      No current facility-administered medications on file prior to visit.       Review of Systems   Constitutional: Negative for activity change, appetite change, chills, diaphoresis, fatigue and fever.  HENT: Negative for mouth sores, postnasal drip, rhinorrhea, sinus pain and sore throat.   Respiratory: Negative for apnea, cough, chest tightness, shortness of breath and wheezing.   Cardiovascular: Negative for chest pain, palpitations and l\has had some right leg swelling but this appears to have improved--with history of distal femoral DVT as noted above Gastrointestinal: Negative for abdominal distention, abdominal pain, constipation, diarrhea, nausea and vomiting.  Genitourinary: Negative for dysuria and frequency. Has history of BPH Musculoskeletal: Negative for arthralgias, joint swelling and myalgias.  Skin: Negative for rash.  Neurological: Negative for dizziness, syncope, weakness, light-headedness and numbness.  Psychiatric/Behavioral: Negative for behavioral problems, confusion and sleep disturbance.    Immunization History  Administered Date(s) Administered  . Influenza,inj,Quad PF,36+ Mos 12/26/2013, 07/09/2015  . PPD Test 03/07/2015  . Pneumococcal Polysaccharide-23 03/04/2015   Pertinent  Health Maintenance Due  Topic Date Due  . FOOT EXAM  08/23/1958  . OPHTHALMOLOGY EXAM  08/23/1958  . URINE MICROALBUMIN  08/23/1958  . COLONOSCOPY  08/23/1998  . INFLUENZA VACCINE  09/26/2017 (Originally 05/27/2016)  . PNA vac Low Risk Adult (2 of 2 - PCV13) 09/26/2017 (Originally 03/03/2016)  . HEMOGLOBIN A1C  05/20/2017   No flowsheet data found. Functional Status Survey:    Vitals:   01/05/17 1054  BP: (!) 94/53  Pulse: 90  Resp: (!) 24  Temp: 99 F (37.2 C)  TempSrc: Oral  SpO2: 99%    Physical Exam Constitutional: He appears well-developedand well-nourished.  HENT:  Head: Normocephalic.  Mouth/Throat: Oropharynx is clear and moist.  Eyes: Pupils are equal, round, and reactive to light.  Neck: Neck supple.  Cardiovascular: Normal  rate, regular rhythmand normal heart sounds.  No murmurheard. Pulmonary/Chest: Effort normaland breath sounds normal. No respiratory distress. He has no wheezes. He has no rales.  Abdominal: Soft. Bowel sounds are normal. He exhibits no distension. There is no tenderness. There is no rebound.   GU could not really appreciate any suprapubic distention Musculoskeletal: Moves all extremities at baseline-continues to have some right lower extremity edema although this appears to be somewhat improved  Lymphadenopathy:  He has no cervical adenopathy.  Neurological: He is alert.   he is pleasant does follow simple verbal commands without difficulty Labs reviewed:  Recent Labs  11/24/16 0339 11/25/16 0428 11/26/16 0613  12/26/16 0922 12/28/16 0647 01/04/17 1500  NA 136 136 136  < > 136 137 143  K 5.6* 4.8 4.5  < > 3.7 3.8 3.4*  CL 100* 97* 97*  < >  97* 100* 106  CO2 23 25 24   < > 30 27 27   GLUCOSE 91 112* 88  < > 118* 93 197*  BUN 55* 41* 64*  < > 38* 32* 33*  CREATININE 7.68* 6.47* 8.80*  < > 4.77* 4.25* 3.64*  CALCIUM 8.3* 8.7* 8.3*  < > 8.7* 8.7* 8.7*  PHOS 6.9* 6.7* 8.7*  --   --   --   --   < > = values in this interval not displayed.  Recent Labs  04/03/16 0935  11/20/16 1446  11/25/16 0428 11/26/16 0613 11/28/16 1400  AST 13*  --  27  --   --   --  27  ALT 12*  --  33  --   --   --  <5*  ALKPHOS 66  --  77  --   --   --  68  BILITOT 0.4  --  0.4  --   --   --  0.9  PROT 6.0*  --  7.1  --   --   --  7.6  ALBUMIN 3.0*  < > 3.7  < > 2.6* 2.3* 2.9*  < > = values in this interval not displayed.  Recent Labs  12/28/16 0647 12/30/16 0700 01/04/17 1500  WBC 9.9 7.3 6.1  NEUTROABS 6.3 4.9 3.6  HGB 8.0* 8.9* 8.9*  HCT 24.8* 27.6* 27.6*  MCV 99.6 98.6 101.5*  PLT 344 305 302   Lab Results  Component Value Date   TSH 1.071 04/03/2016   Lab Results  Component Value Date   HGBA1C 5.0 11/20/2016   No results found for: CHOL, HDL, LDLCALC, LDLDIRECT, TRIG,  CHOLHDL  Significant Diagnostic Results in last 30 days:  Dg Chest 2 View  Result Date: 12/26/2016 CLINICAL DATA:  Fevers EXAM: CHEST  2 VIEW COMPARISON:  11/20/2016 FINDINGS: The heart size and mediastinal contours are within normal limits. Both lungs are clear. The visualized skeletal structures are unremarkable. IMPRESSION: No active cardiopulmonary disease. Electronically Signed   By: Inez Catalina M.D.   On: 12/26/2016 10:50   US Venous Img Lower Unilateral Right  Result Date: 12/24/2016 CLINICAL DATA:  Right lower extremity edema over the last day. EXAM: Right LOWER EXTREMITY VENOUS DOPPLER ULTRASOUND TECHNIQUE: Gray-scale sonography with graded compression, as well as color Doppler and duplex ultrasound were performed to evaluate the lower extremity deep venous systems from the level of the common femoral vein and including the common femoral, femoral, profunda femoral, popliteal and calf veins including the posterior tibial, peroneal and gastrocnemius veins when visible. The superficial great saphenous vein was also interrogated. Spectral Doppler was utilized to evaluate flow at rest and with distal augmentation maneuvers in the common femoral, femoral and popliteal veins. COMPARISON:  None. FINDINGS: Contralateral Common Femoral Vein: Respiratory phasicity is normal and symmetric with the symptomatic side. No evidence of thrombus. Normal compressibility. Common Femoral Vein: Normal proximally. Thrombus present in the distal femoral vein. This is nonocclusive. Saphenofemoral Junction: No evidence of thrombus. Normal compressibility and flow on color Doppler imaging. Profunda Femoral Vein: No evidence of thrombus. Normal compressibility and flow on color Doppler imaging. Popliteal Vein: No evidence of thrombus. Normal compressibility, respiratory phasicity and response to augmentation. Calf Veins: No evidence of thrombus. Normal compressibility and flow on color Doppler imaging. Superficial Great  Saphenous Vein: No evidence of thrombus. Normal compressibility and flow on color Doppler imaging. Venous Reflux:  None. Other Findings:  None. IMPRESSION: Deep venous thrombosis of the distal femoral  vein on the right. This is nonocclusive. Electronically Signed   By: Nelson Chimes M.D.   On: 12/24/2016 15:32    Assessment/Plan  #1 history of occult positive stool 2-with some previous history of GI bleed-his hemoglobin appears to be stable we will update this on March 15 to ensure stability he is on a proton pump inhibitor also is on Epogen at dialysis and is on iron-.  Also will order a GI consult.  #2 history of recent right leg DVT-again this is challenging with his history of GI bleed but hemoglobin show stability which is encouraging at this point will continue his Coumadin 3.5 mg a day and recheck an INR tomorrow will have to keep a close eye on this obviously with his history of bleeding as well-again this is challenging-this plan of action was discussed with Dr. Dellia Nims via phone.  #3 history of right hip fracture he is followed by surgery this was surgically repaired at this point will monitor appears to be stable in this regards.  #4 history of end-stage renal disease continues hemodialysis 3 times a week appears to be tolerating this well-he does have low blood pressures at times but he is asymptomatic.  #5 history of BPH again he is followed by urology apparently there will be procedure performed once his acute issues have stabilized including recent DVT--continues on Flomax.  #6 history of tachycardia he continues on Coreg 6.25 mg twice a day-this again is challenging with his history of lower blood pressures at times but he continues to be asymptomatic in this regards -pulses continued be often in the 90s so would be hesitant to reduce the Coreg DC this secondary to tachycardia concerns especially since he appears to be tolerating a lower blood pressures well at this time  #7 history  of hypothyroidism continues on Synthroid-TSH has been normal the past but we will update this   #8 hyperlipidemia he continues on Lipitor.--We'll update a lipid panel for current values  9 history GERD he continues on Prilosec  #10 history of lower extremity edema he continues on Demadex-.  #11 history of diabetes continues on Tradjenta--as noted above blood sugars appear to be stable the 90s to lower 100s generally  [id number]-greater than 40 minutes spent assessing patient-discussing his status with nursing staff reviewing his chart review his labs and coordinating and formulating a plan of care for numerous diagnoses-greater than 50% of time spent coordinating plan of care   -

## 2017-01-06 ENCOUNTER — Encounter (HOSPITAL_COMMUNITY)
Admission: RE | Admit: 2017-01-06 | Discharge: 2017-01-06 | Disposition: A | Discharge status: Home / Self Care | Payer: Medicare Other | Source: Skilled Nursing Facility | Attending: Internal Medicine | Admitting: Internal Medicine

## 2017-01-06 DIAGNOSIS — N2581 Secondary hyperparathyroidism of renal origin: Secondary | ICD-10-CM | POA: Diagnosis not present

## 2017-01-06 DIAGNOSIS — Z992 Dependence on renal dialysis: Secondary | ICD-10-CM | POA: Diagnosis not present

## 2017-01-06 DIAGNOSIS — D509 Iron deficiency anemia, unspecified: Secondary | ICD-10-CM | POA: Diagnosis not present

## 2017-01-06 DIAGNOSIS — E162 Hypoglycemia, unspecified: Secondary | ICD-10-CM | POA: Diagnosis not present

## 2017-01-06 DIAGNOSIS — N186 End stage renal disease: Secondary | ICD-10-CM | POA: Diagnosis not present

## 2017-01-06 DIAGNOSIS — D631 Anemia in chronic kidney disease: Secondary | ICD-10-CM | POA: Diagnosis not present

## 2017-01-06 LAB — PROTIME-INR
INR: 2.64
Prothrombin Time: 28.7 seconds — ABNORMAL HIGH (ref 11.4–15.2)

## 2017-01-08 ENCOUNTER — Encounter (HOSPITAL_COMMUNITY)
Admission: RE | Admit: 2017-01-08 | Discharge: 2017-01-08 | Disposition: A | Discharge status: Home / Self Care | Payer: Medicare Other | Source: Skilled Nursing Facility | Attending: Internal Medicine | Admitting: Internal Medicine

## 2017-01-08 DIAGNOSIS — D509 Iron deficiency anemia, unspecified: Secondary | ICD-10-CM | POA: Diagnosis not present

## 2017-01-08 DIAGNOSIS — N2581 Secondary hyperparathyroidism of renal origin: Secondary | ICD-10-CM | POA: Diagnosis not present

## 2017-01-08 DIAGNOSIS — N186 End stage renal disease: Secondary | ICD-10-CM | POA: Diagnosis not present

## 2017-01-08 DIAGNOSIS — Z992 Dependence on renal dialysis: Secondary | ICD-10-CM | POA: Diagnosis not present

## 2017-01-08 DIAGNOSIS — D631 Anemia in chronic kidney disease: Secondary | ICD-10-CM | POA: Diagnosis not present

## 2017-01-08 DIAGNOSIS — E162 Hypoglycemia, unspecified: Secondary | ICD-10-CM | POA: Diagnosis not present

## 2017-01-08 LAB — CBC WITH DIFFERENTIAL/PLATELET
Basophils Absolute: 0 10*3/uL (ref 0.0–0.1)
Basophils Relative: 0 %
Eosinophils Absolute: 0.2 10*3/uL (ref 0.0–0.7)
Eosinophils Relative: 3 %
HCT: 27.1 % — ABNORMAL LOW (ref 39.0–52.0)
Hemoglobin: 8.8 g/dL — ABNORMAL LOW (ref 13.0–17.0)
Lymphocytes Relative: 28 %
Lymphs Abs: 1.6 10*3/uL (ref 0.7–4.0)
MCH: 32.6 pg (ref 26.0–34.0)
MCHC: 32.5 g/dL (ref 30.0–36.0)
MCV: 100.4 fL — ABNORMAL HIGH (ref 78.0–100.0)
Monocytes Absolute: 0.7 10*3/uL (ref 0.1–1.0)
Monocytes Relative: 13 %
Neutro Abs: 3.1 10*3/uL (ref 1.7–7.7)
Neutrophils Relative %: 56 %
Platelets: 294 10*3/uL (ref 150–400)
RBC: 2.7 MIL/uL — ABNORMAL LOW (ref 4.22–5.81)
RDW: 18.1 % — ABNORMAL HIGH (ref 11.5–15.5)
WBC: 5.6 10*3/uL (ref 4.0–10.5)

## 2017-01-08 LAB — PROTIME-INR
INR: 1.87
Prothrombin Time: 21.8 seconds — ABNORMAL HIGH (ref 11.4–15.2)

## 2017-01-09 ENCOUNTER — Encounter (HOSPITAL_COMMUNITY)
Admission: RE | Admit: 2017-01-09 | Discharge: 2017-01-09 | Disposition: A | Discharge status: Home / Self Care | Payer: Medicare Other | Source: Skilled Nursing Facility | Attending: Internal Medicine | Admitting: Internal Medicine

## 2017-01-09 LAB — PROTIME-INR
INR: 1.89
Prothrombin Time: 22 seconds — ABNORMAL HIGH (ref 11.4–15.2)

## 2017-01-10 ENCOUNTER — Encounter (HOSPITAL_COMMUNITY)
Admission: RE | Admit: 2017-01-10 | Discharge: 2017-01-10 | Disposition: A | Discharge status: Home / Self Care | Payer: Medicare Other | Source: Skilled Nursing Facility | Attending: Internal Medicine | Admitting: Internal Medicine

## 2017-01-10 DIAGNOSIS — N186 End stage renal disease: Secondary | ICD-10-CM | POA: Diagnosis not present

## 2017-01-10 DIAGNOSIS — D631 Anemia in chronic kidney disease: Secondary | ICD-10-CM | POA: Diagnosis not present

## 2017-01-10 DIAGNOSIS — N2581 Secondary hyperparathyroidism of renal origin: Secondary | ICD-10-CM | POA: Diagnosis not present

## 2017-01-10 DIAGNOSIS — E162 Hypoglycemia, unspecified: Secondary | ICD-10-CM | POA: Diagnosis not present

## 2017-01-10 DIAGNOSIS — Z992 Dependence on renal dialysis: Secondary | ICD-10-CM | POA: Diagnosis not present

## 2017-01-10 DIAGNOSIS — Z7901 Long term (current) use of anticoagulants: Secondary | ICD-10-CM | POA: Insufficient documentation

## 2017-01-10 DIAGNOSIS — D509 Iron deficiency anemia, unspecified: Secondary | ICD-10-CM | POA: Diagnosis not present

## 2017-01-10 LAB — PROTIME-INR
INR: 2.07
Prothrombin Time: 23.6 seconds — ABNORMAL HIGH (ref 11.4–15.2)

## 2017-01-12 ENCOUNTER — Encounter (HOSPITAL_COMMUNITY)
Admission: RE | Admit: 2017-01-12 | Discharge: 2017-01-12 | Disposition: A | Discharge status: Home / Self Care | Payer: Medicare Other | Source: Skilled Nursing Facility | Attending: Internal Medicine | Admitting: Internal Medicine

## 2017-01-12 DIAGNOSIS — S72491D Other fracture of lower end of right femur, subsequent encounter for closed fracture with routine healing: Secondary | ICD-10-CM | POA: Diagnosis not present

## 2017-01-12 LAB — CBC
HCT: 26.8 % — ABNORMAL LOW (ref 39.0–52.0)
Hemoglobin: 8.5 g/dL — ABNORMAL LOW (ref 13.0–17.0)
MCH: 32.2 pg (ref 26.0–34.0)
MCHC: 31.7 g/dL (ref 30.0–36.0)
MCV: 101.5 fL — ABNORMAL HIGH (ref 78.0–100.0)
Platelets: 284 10*3/uL (ref 150–400)
RBC: 2.64 MIL/uL — ABNORMAL LOW (ref 4.22–5.81)
RDW: 18.4 % — ABNORMAL HIGH (ref 11.5–15.5)
WBC: 5.6 10*3/uL (ref 4.0–10.5)

## 2017-01-12 LAB — PROTIME-INR
INR: 2.16
Prothrombin Time: 24.4 seconds — ABNORMAL HIGH (ref 11.4–15.2)

## 2017-01-13 DIAGNOSIS — E162 Hypoglycemia, unspecified: Secondary | ICD-10-CM | POA: Diagnosis not present

## 2017-01-13 DIAGNOSIS — D509 Iron deficiency anemia, unspecified: Secondary | ICD-10-CM | POA: Diagnosis not present

## 2017-01-13 DIAGNOSIS — N2581 Secondary hyperparathyroidism of renal origin: Secondary | ICD-10-CM | POA: Diagnosis not present

## 2017-01-13 DIAGNOSIS — Z992 Dependence on renal dialysis: Secondary | ICD-10-CM | POA: Diagnosis not present

## 2017-01-13 DIAGNOSIS — N186 End stage renal disease: Secondary | ICD-10-CM | POA: Diagnosis not present

## 2017-01-13 DIAGNOSIS — D631 Anemia in chronic kidney disease: Secondary | ICD-10-CM | POA: Diagnosis not present

## 2017-01-14 ENCOUNTER — Encounter (HOSPITAL_COMMUNITY)
Admission: RE | Admit: 2017-01-14 | Discharge: 2017-01-14 | Disposition: A | Discharge status: Home / Self Care | Payer: Medicare Other | Source: Skilled Nursing Facility | Attending: Internal Medicine | Admitting: Internal Medicine

## 2017-01-14 LAB — CBC
HCT: 28.6 % — ABNORMAL LOW (ref 39.0–52.0)
Hemoglobin: 9.4 g/dL — ABNORMAL LOW (ref 13.0–17.0)
MCH: 33 pg (ref 26.0–34.0)
MCHC: 32.9 g/dL (ref 30.0–36.0)
MCV: 100.4 fL — ABNORMAL HIGH (ref 78.0–100.0)
Platelets: 267 10*3/uL (ref 150–400)
RBC: 2.85 MIL/uL — ABNORMAL LOW (ref 4.22–5.81)
RDW: 18.1 % — ABNORMAL HIGH (ref 11.5–15.5)
WBC: 5.4 10*3/uL (ref 4.0–10.5)

## 2017-01-14 LAB — PROTIME-INR
INR: 2.47
Prothrombin Time: 27.2 seconds — ABNORMAL HIGH (ref 11.4–15.2)

## 2017-01-15 DIAGNOSIS — N2581 Secondary hyperparathyroidism of renal origin: Secondary | ICD-10-CM | POA: Diagnosis not present

## 2017-01-15 DIAGNOSIS — D509 Iron deficiency anemia, unspecified: Secondary | ICD-10-CM | POA: Diagnosis not present

## 2017-01-15 DIAGNOSIS — E162 Hypoglycemia, unspecified: Secondary | ICD-10-CM | POA: Diagnosis not present

## 2017-01-15 DIAGNOSIS — Z992 Dependence on renal dialysis: Secondary | ICD-10-CM | POA: Diagnosis not present

## 2017-01-15 DIAGNOSIS — D631 Anemia in chronic kidney disease: Secondary | ICD-10-CM | POA: Diagnosis not present

## 2017-01-15 DIAGNOSIS — N186 End stage renal disease: Secondary | ICD-10-CM | POA: Diagnosis not present

## 2017-01-16 ENCOUNTER — Encounter (HOSPITAL_COMMUNITY)
Admission: RE | Admit: 2017-01-16 | Discharge: 2017-01-16 | Disposition: A | Discharge status: Home / Self Care | Payer: Medicare Other | Source: Skilled Nursing Facility | Attending: Internal Medicine | Admitting: Internal Medicine

## 2017-01-16 ENCOUNTER — Non-Acute Institutional Stay (SKILLED_NURSING_FACILITY): Payer: Medicare Other | Admitting: Internal Medicine

## 2017-01-16 DIAGNOSIS — X58XXXD Exposure to other specified factors, subsequent encounter: Secondary | ICD-10-CM | POA: Insufficient documentation

## 2017-01-16 DIAGNOSIS — I82401 Acute embolism and thrombosis of unspecified deep veins of right lower extremity: Secondary | ICD-10-CM | POA: Diagnosis not present

## 2017-01-16 DIAGNOSIS — D509 Iron deficiency anemia, unspecified: Secondary | ICD-10-CM | POA: Diagnosis not present

## 2017-01-16 DIAGNOSIS — D638 Anemia in other chronic diseases classified elsewhere: Secondary | ICD-10-CM | POA: Diagnosis not present

## 2017-01-16 DIAGNOSIS — N401 Enlarged prostate with lower urinary tract symptoms: Secondary | ICD-10-CM | POA: Insufficient documentation

## 2017-01-16 DIAGNOSIS — R000 Tachycardia, unspecified: Secondary | ICD-10-CM | POA: Diagnosis not present

## 2017-01-16 DIAGNOSIS — Z5181 Encounter for therapeutic drug level monitoring: Secondary | ICD-10-CM | POA: Diagnosis not present

## 2017-01-16 DIAGNOSIS — S72421D Displaced fracture of lateral condyle of right femur, subsequent encounter for closed fracture with routine healing: Secondary | ICD-10-CM | POA: Diagnosis not present

## 2017-01-16 DIAGNOSIS — Z7901 Long term (current) use of anticoagulants: Secondary | ICD-10-CM

## 2017-01-16 LAB — PROTIME-INR
INR: 2.08
Prothrombin Time: 23.7 seconds — ABNORMAL HIGH (ref 11.4–15.2)

## 2017-01-16 LAB — LIPID PANEL
Cholesterol: 98 mg/dL (ref 0–200)
HDL: 33 mg/dL — ABNORMAL LOW (ref >40)
LDL Cholesterol: 50 mg/dL (ref 0–99)
Total CHOL/HDL Ratio: 3 RATIO
Triglycerides: 77 mg/dL (ref <150)
VLDL: 15 mg/dL (ref 0–40)

## 2017-01-16 LAB — TSH: TSH: 3.004 u[IU]/mL (ref 0.350–4.500)

## 2017-01-16 NOTE — Progress Notes (Signed)
This is an acute visit.  Level care skilled.  Facility [facility].  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 As well as follow-up of anemia  History of present illness.  Patient is a very pleasant 71 year old male here for rehabilitation after sustaining a right hip fracture that was surgically repaired.  This has been complicated recently with a diagnosis of a right femoral DVT.  INR today is 2.08-it was 2.47--4 days ago. And was 2.16---6 days ago.  He has now been therapeutic for approximately a week which is encouraging this has been a challenge at times.  We're trying to keep his INR in the lower 2 range secondary to a history of GI bleed as well.  He has had some occult positive stools.  However his hemoglobin continues to be stable actually somewhat improved-up to 9.4 on lab done on March 21-he had been as low as 8.0 on the lab done approximately 3 weeks ago.  He does have a GI consult pending.  He does have a history of anemia of chronic disease with a history of end-stage renal disease he is on hemodialysis and does receive Epogen at dialysis. He also is on iron and on a PPI  Vital signs continued stable he does not have any acute complaints blood pressure tends to be somewhat low with systolics often in the 16X but he is asymptomatic he is not symptomatic-he is on low dose Coreg secondary to history of tachycardia which appears to be controlled.        Past Medical History:  Diagnosis Date  . Abnormal CT scan, kidney 10/06/2011  . Acute pyelonephritis 10/07/2011  . Anemia    normocytic  . Anxiety    mental retardation  . Bladder wall thickening 10/06/2011  . BPH (benign prostatic hypertrophy)   . Diabetes mellitus   . Edema     history of lower extremity edema  . Heme positive stool   . Hydronephrosis   . Hyperkalemia   . Hyperlipidemia   . Hypernatremia   . Hypertension   .  Hypothyroidism   . Impaired speech   . 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 MR (mental retardation)   . Obstructive uropathy   . 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 Poor historian poor historian  . Protein calorie malnutrition ([place])   . Pyelonephritis   . Renal failure (ARF), acute on chronic (HCC)   . Renal insufficiency    chronic history  . Smoking   . Uremia   . Urinary retention   . UTI (lower urinary tract infection) 10/06/2011  . UTI (lower urinary tract infection)         Past Surgical History:  Procedure Laterality Date  . AV FISTULA PLACEMENT Left 07/06/2015   Procedure:  INSERTION LEFT ARM ARTERIOVENOUS GORTEX GRAFT;  Surgeon: Angelia Mould, MD;  Location: [place];  Service: Vascular;  Laterality: Left;  . AV FISTULA PLACEMENT Right 02/26/2016   Procedure: ARTERIOVENOUS (AV) FISTULA CREATION ;  Surgeon: Angelia Mould, MD;  Location: [place];  Service: Vascular;  Laterality: Right;  . [place] REMOVAL Left 10/09/2015   Procedure: REMOVAL OF ARTERIOVENOUS GORETEX GRAFT ([place]) Evacuation of Lymphocele, Vein Patch angioplasty of brachial artery.;  Surgeon: Angelia Mould, MD;  Location: [place];  Service: Vascular;  Laterality: Left;  . BASCILIC VEIN TRANSPOSITION Right 02/26/2016   Procedure: Right BASCILIC VEIN TRANSPOSITION;  Surgeon: Angelia Mould, MD;  Location: [place];  Service:  Vascular;  Laterality: Right;  . CIRCUMCISION N/A 01/04/2014   Procedure: CIRCUMCISION ADULT (procedure #1);  Surgeon: Marissa Nestle, MD;  Location: [facility];  Service: Urology;  Laterality: N/A;  . CYSTOSCOPY W/ RETROGRADES Bilateral 06/29/2015   Procedure: CYSTOSCOPY, DILATION OF URETHRAL STRICTURE WITH BILATERAL RETROGRADE PYELOGRAM,SUPRAPUBIC TUBE CHANGE;  Surgeon: Festus Aloe, MD;  Location: [facility];  Service: Urology;  Laterality: Bilateral;  . CYSTOSCOPY WITH URETHRAL DILATATION N/A 12/29/2013   Procedure: CYSTOSCOPY WITH URETHRAL DILATATION;   Surgeon: Marissa Nestle, MD;  Location: [facility];  Service: Urology;  Laterality: N/A;  . ORIF FEMUR FRACTURE Right 11/22/2016   Procedure: OPEN REDUCTION INTERNAL FIXATION (ORIF) DISTAL FEMUR FRACTURE;  Surgeon: Rod Can, MD;  Location: [place];  Service: Orthopedics;  Laterality: Right;  . PERIPHERAL VASCULAR CATHETERIZATION N/A 10/08/2015   Procedure: A/V Shuntogram;  Surgeon: Angelia Mould, MD;  Location: [facility];  Service: Cardiovascular;  Laterality: N/A;  . TRANSURETHRAL RESECTION OF PROSTATE N/A 01/04/2014   Procedure: TRANSURETHRAL RESECTION OF THE PROSTATE (TURP) (procedure #2);  Surgeon: Marissa Nestle, MD;  Location: [facility];  Service: Urology;  Laterality: N/A;    No Known Allergies        Current Outpatient Prescriptions on File Prior to Visit  Medication Sig Dispense Refill  . Amino Acids-Protein Hydrolys (FEEDING SUPPLEMENT, PRO-STAT 64,) LIQD Take 30 mLs by mouth 2 (two) times daily between meals.    Marland Kitchen atorvastatin (LIPITOR) 40 MG tablet Take 40 mg by mouth every evening.    . carvedilol (COREG) 6.25 MG tablet Take 1 tablet (6.25 mg total) by mouth 2 (two) times daily with a meal. 60 tablet 3  . epoetin alfa (EPOGEN,PROCRIT) 4000 UNIT/ML injection Inject 4,000 Units into the vein every 14 (fourteen) days.    . ferrous sulfate 325 (65 FE) MG tablet Take 325 mg by mouth 2 (two) times daily with a meal.     . levothyroxine (SYNTHROID, LEVOTHROID) 75 MCG tablet Take 75 mcg by mouth daily before breakfast.     . linagliptin (TRADJENTA) 5 MG TABS tablet Take 2.5 mg by mouth every evening.     . Multiple Vitamin (MULTIVITAMIN WITH MINERALS) TABS tablet Take 1 tablet by mouth every evening.     . Neo-Bacit-Poly-Lidocaine (FIRST AID PLUS LIDOCAINE EX) Apply 1 application topically 3 (three) times a week. Prior to dialysis    . omeprazole (PRILOSEC) 20 MG capsule Take 40 mg by mouth every evening.     . ondansetron (ZOFRAN) 4 MG tablet  Take 4 mg by mouth every 8 (eight) hours as needed for nausea or vomiting.    . tamsulosin (FLOMAX) 0.4 MG CAPS capsule Take 0.4 mg by mouth daily.    Marland Kitchen torsemide (DEMADEX) 10 MG tablet Take 5 tablets (50 mg total) by mouth daily. 30 tablet 3  . Vitamin D, Ergocalciferol, (DRISDOL) 50000 UNITS CAPS capsule Take 1 capsule (50,000 Units total) by mouth every 7 (seven) days. 30 capsule 3  . warfarin (COUMADIN) 10 MG tablet Take 3.5 mg by mouth daily.      No current facility-administered medications on file prior to visit.      Review of Systems   Constitutional: Negative for activity change, appetite change, chills, diaphoresis, fatigue and fever.  HENT: Negative for mouth sores, postnasal drip, rhinorrhea, sinus pain and sore throat.  Respiratory: Negative for apnea, cough, chest tightness, shortness of breath and wheezing.  Cardiovascular: Negative for chest pain, palpitations and l\has had some right  leg swelling but this appears to have improved--with history of distal femoral DVT as noted above Gastrointestinal: Negative for abdominal distention, abdominal pain, constipation, diarrhea, nausea and vomiting.  Genitourinary: Negative for dysuria and frequency. Has history of BPH Musculoskeletal: Negative for arthralgias, joint swelling and myalgias.  Skin: Negative for rash. Or increased bruising or bleeding Neurological: Negative for dizziness, syncope, weakness, light-headedness and numbness.  Psychiatric/Behavioral: Negative for behavioral problems, confusion and sleep disturbance.        Immunization History  Administered Date(s) Administered  . Influenza,inj,Quad PF,36+ Mos 12/26/2013, 07/09/2015  . PPD Test 03/07/2015  . Pneumococcal Polysaccharide-23 03/04/2015       Pertinent  Health Maintenance Due  Topic Date Due  . FOOT EXAM  08/23/1958  . OPHTHALMOLOGY EXAM  08/23/1958  . URINE MICROALBUMIN  08/23/1958  . COLONOSCOPY  08/23/1998  . INFLUENZA VACCINE   09/26/2017 (Originally 05/27/2016)  . PNA vac Low Risk Adult (2 of 2 - PCV13) 09/26/2017 (Originally 03/03/2016)  . HEMOGLOBIN A1C  05/20/2017   No flowsheet data found. Functional Status Survey:                              He is afebrile pulse 84 respirations 18 blood pressure 94/54 Physical Exam Constitutional: He appears well-developedand well-nourished.  HENT:  Head: Normocephalic.  Mouth/Throat: Oropharynx is clear and moist.  Eyes: Pupils are equal, round, and reactive to light.  Neck: Neck supple.  Cardiovascular: Normal rate, regular rhythmand normal heart sounds.  No murmurheard. Pulmonary/Chest: Effort normaland breath sounds normal. No respiratory distress. He has no wheezes. He has no rales.  Abdominal: Soft. Bowel sounds are normal. He exhibits no distension. There is no tenderness. There is no rebound.   GU could not really appreciate any suprapubic distention Musculoskeletal: Moves all extremities at baseline-continues to have some right lower extremity edema although this appears to be gradually improving-he has compression hose on I was able to palpate a pedal pulse on the right   .  Neurological: He is alert.  he is pleasant does follow simple verbal commands without difficulty Labs reviewed:   01/16/2017.  Cholesterol 98-triglycerides 77-HDL 33-LDL 50.  TSH-3.004.  INR-2.08.  01/14/2017.  WBC 5.4 hemoglobin 9.4 platelets 267.       RecentLabs(withinlast365days)   Recent Labs  11/24/16 0339 11/25/16 0428 11/26/16 4431  12/26/16 0922 12/28/16 0647 01/04/17 1500  NA 136 136 136  < > 136 137 143  K 5.6* 4.8 4.5  < > 3.7 3.8 3.4*  CL 100* 97* 97*  < > 97* 100* 106  CO2 23 25 24   < > 30 27 27   GLUCOSE 91 112* 88  < > 118* 93 197*  BUN 55* 41* 64*  < > 38* 32* 33*  CREATININE 7.68* 6.47* 8.80*  < > 4.77* 4.25* 3.64*  CALCIUM 8.3* 8.7* 8.3*  < > 8.7* 8.7* 8.7*  PHOS 6.9* 6.7* 8.7*  --   --   --   --   < > = values  in this interval not displayed.    RecentLabs(withinlast365days)   Recent Labs  04/03/16 0935  11/20/16 1446  11/25/16 0428 11/26/16 0613 11/28/16 1400  AST 13*  --  27  --   --   --  27  ALT 12*  --  33  --   --   --  <5*  ALKPHOS 66  --  77  --   --   --  68  BILITOT 0.4  --  0.4  --   --   --  0.9  PROT 6.0*  --  7.1  --   --   --  7.6  ALBUMIN 3.0*  < > 3.7  < > 2.6* 2.3* 2.9*  < > = values in this interval not displayed.    RecentLabs(withinlast365days)   Recent Labs  12/28/16 0647 12/30/16 0700 01/04/17 1500  WBC 9.9 7.3 6.1  NEUTROABS 6.3 4.9 3.6  HGB 8.0* 8.9* 8.9*  HCT 24.8* 27.6* 27.6*  MCV 99.6 98.6 101.5*  PLT 344 305 302     RecentLabs       Lab Results  Component Value Date   TSH 1.071 04/03/2016     RecentLabs       Lab Results  Component Value Date   HGBA1C 5.0 11/20/2016     RecentLabs  No results found for: CHOL, HDL, LDLCALC, LDLDIRECT, TRIG, CHOLHDL    Significant Diagnostic Results in last 30 days:   ImagingResults  Dg Chest 2 View  Result Date: 12/26/2016 CLINICAL DATA:  Fevers EXAM: CHEST  2 VIEW COMPARISON:  11/20/2016 FINDINGS: The heart size and mediastinal contours are within normal limits. Both lungs are clear. The visualized skeletal structures are unremarkable. IMPRESSION: No active cardiopulmonary disease. Electronically Signed   By: Inez Catalina M.D.   On: 12/26/2016 10:50   US Venous Img Lower Unilateral Right  Result Date: 12/24/2016 CLINICAL DATA:  Right lower extremity edema over the last day. EXAM: Right LOWER EXTREMITY VENOUS DOPPLER ULTRASOUND TECHNIQUE: Gray-scale sonography with graded compression, as well as color Doppler and duplex ultrasound were performed to evaluate the lower extremity deep venous systems from the level of the common femoral vein and including the common femoral, femoral, profunda femoral, popliteal and calf veins including the posterior tibial, peroneal and  gastrocnemius veins when visible. The superficial great saphenous vein was also interrogated. Spectral Doppler was utilized to evaluate flow at rest and with distal augmentation maneuvers in the common femoral, femoral and popliteal veins. COMPARISON:  None. FINDINGS: Contralateral Common Femoral Vein: Respiratory phasicity is normal and symmetric with the symptomatic side. No evidence of thrombus. Normal compressibility. Common Femoral Vein: Normal proximally. Thrombus present in the distal femoral vein. This is nonocclusive. Saphenofemoral Junction: No evidence of thrombus. Normal compressibility and flow on color Doppler imaging. Profunda Femoral Vein: No evidence of thrombus. Normal compressibility and flow on color Doppler imaging. Popliteal Vein: No evidence of thrombus. Normal compressibility, respiratory phasicity and response to augmentation. Calf Veins: No evidence of thrombus. Normal compressibility and flow on color Doppler imaging. Superficial Great Saphenous Vein: No evidence of thrombus. Normal compressibility and flow on color Doppler imaging. Venous Reflux:  None. Other Findings:  None. IMPRESSION: Deep venous thrombosis of the distal femoral vein on the right. This is nonocclusive. Electronically Signed   By: Nelson Chimes M.D.   On: 12/24/2016 15:32     Assessment plan.  #1 history of recent right leg DVT on anticoagulation with Coumadin-current Coumadin dose is 4.5 he appears to have achieved a. If stability here again goal is to keep his INR in the low twos secondary to history of occult positive blood testing.  -At this point we'll continue current dose and recheck an INR on Monday. Edema appears to be somewhat improved he is not complaining of any pain  #2 history of anemia involved history as noted above with occult positive stool 2 some previous history of GI bleed-hemoglobin actually  has shown some improvement at 9.4 we'll update this as well first laboratory day next  week.  He continues on iron Epogen at dialysis as well as a proton pump inhibitor.   #3 history of tachycardia this appears to be controlled he is on Coreg 6.25 mg twice a day-we have kept him on this secondary to concerns of tachycardia  if this is rdiscontinued-he appears to be asymptomatic in regards to having systolics in the 79K and occasionally 80s-at this point will monitor  [phone]

## 2017-01-17 DIAGNOSIS — D509 Iron deficiency anemia, unspecified: Secondary | ICD-10-CM | POA: Diagnosis not present

## 2017-01-17 DIAGNOSIS — D631 Anemia in chronic kidney disease: Secondary | ICD-10-CM | POA: Diagnosis not present

## 2017-01-17 DIAGNOSIS — N186 End stage renal disease: Secondary | ICD-10-CM | POA: Diagnosis not present

## 2017-01-17 DIAGNOSIS — E162 Hypoglycemia, unspecified: Secondary | ICD-10-CM | POA: Diagnosis not present

## 2017-01-17 DIAGNOSIS — Z992 Dependence on renal dialysis: Secondary | ICD-10-CM | POA: Diagnosis not present

## 2017-01-17 DIAGNOSIS — N2581 Secondary hyperparathyroidism of renal origin: Secondary | ICD-10-CM | POA: Diagnosis not present

## 2017-01-19 ENCOUNTER — Encounter (HOSPITAL_COMMUNITY)
Admission: RE | Admit: 2017-01-19 | Discharge: 2017-01-19 | Disposition: A | Discharge status: Home / Self Care | Payer: Medicare Other | Source: Skilled Nursing Facility | Attending: Internal Medicine | Admitting: Internal Medicine

## 2017-01-19 DIAGNOSIS — X58XXXD Exposure to other specified factors, subsequent encounter: Secondary | ICD-10-CM | POA: Diagnosis not present

## 2017-01-19 DIAGNOSIS — I82401 Acute embolism and thrombosis of unspecified deep veins of right lower extremity: Secondary | ICD-10-CM | POA: Insufficient documentation

## 2017-01-19 DIAGNOSIS — N401 Enlarged prostate with lower urinary tract symptoms: Secondary | ICD-10-CM | POA: Diagnosis not present

## 2017-01-19 DIAGNOSIS — S72421D Displaced fracture of lateral condyle of right femur, subsequent encounter for closed fracture with routine healing: Secondary | ICD-10-CM | POA: Insufficient documentation

## 2017-01-19 LAB — CBC WITH DIFFERENTIAL/PLATELET
Basophils Absolute: 0 10*3/uL (ref 0.0–0.1)
Basophils Relative: 0 %
Eosinophils Absolute: 0.2 10*3/uL (ref 0.0–0.7)
Eosinophils Relative: 4 %
HCT: 28.2 % — ABNORMAL LOW (ref 39.0–52.0)
Hemoglobin: 9.2 g/dL — ABNORMAL LOW (ref 13.0–17.0)
Lymphocytes Relative: 31 %
Lymphs Abs: 1.8 10*3/uL (ref 0.7–4.0)
MCH: 33.7 pg (ref 26.0–34.0)
MCHC: 32.6 g/dL (ref 30.0–36.0)
MCV: 103.3 fL — ABNORMAL HIGH (ref 78.0–100.0)
Monocytes Absolute: 0.9 10*3/uL (ref 0.1–1.0)
Monocytes Relative: 15 %
Neutro Abs: 3 10*3/uL (ref 1.7–7.7)
Neutrophils Relative %: 50 %
Platelets: 253 10*3/uL (ref 150–400)
RBC: 2.73 MIL/uL — ABNORMAL LOW (ref 4.22–5.81)
RDW: 18.4 % — ABNORMAL HIGH (ref 11.5–15.5)
WBC: 6 10*3/uL (ref 4.0–10.5)

## 2017-01-19 LAB — PROTIME-INR
INR: 2.41
Prothrombin Time: 26.7 seconds — ABNORMAL HIGH (ref 11.4–15.2)

## 2017-01-20 DIAGNOSIS — D509 Iron deficiency anemia, unspecified: Secondary | ICD-10-CM | POA: Diagnosis not present

## 2017-01-20 DIAGNOSIS — D631 Anemia in chronic kidney disease: Secondary | ICD-10-CM | POA: Diagnosis not present

## 2017-01-20 DIAGNOSIS — Z992 Dependence on renal dialysis: Secondary | ICD-10-CM | POA: Diagnosis not present

## 2017-01-20 DIAGNOSIS — N2581 Secondary hyperparathyroidism of renal origin: Secondary | ICD-10-CM | POA: Diagnosis not present

## 2017-01-20 DIAGNOSIS — E162 Hypoglycemia, unspecified: Secondary | ICD-10-CM | POA: Diagnosis not present

## 2017-01-20 DIAGNOSIS — N186 End stage renal disease: Secondary | ICD-10-CM | POA: Diagnosis not present

## 2017-01-22 DIAGNOSIS — D509 Iron deficiency anemia, unspecified: Secondary | ICD-10-CM | POA: Diagnosis not present

## 2017-01-22 DIAGNOSIS — E162 Hypoglycemia, unspecified: Secondary | ICD-10-CM | POA: Diagnosis not present

## 2017-01-22 DIAGNOSIS — D631 Anemia in chronic kidney disease: Secondary | ICD-10-CM | POA: Diagnosis not present

## 2017-01-22 DIAGNOSIS — N2581 Secondary hyperparathyroidism of renal origin: Secondary | ICD-10-CM | POA: Diagnosis not present

## 2017-01-22 DIAGNOSIS — N186 End stage renal disease: Secondary | ICD-10-CM | POA: Diagnosis not present

## 2017-01-22 DIAGNOSIS — Z992 Dependence on renal dialysis: Secondary | ICD-10-CM | POA: Diagnosis not present

## 2017-01-24 DIAGNOSIS — D631 Anemia in chronic kidney disease: Secondary | ICD-10-CM | POA: Diagnosis not present

## 2017-01-24 DIAGNOSIS — D509 Iron deficiency anemia, unspecified: Secondary | ICD-10-CM | POA: Diagnosis not present

## 2017-01-24 DIAGNOSIS — N2581 Secondary hyperparathyroidism of renal origin: Secondary | ICD-10-CM | POA: Diagnosis not present

## 2017-01-24 DIAGNOSIS — E162 Hypoglycemia, unspecified: Secondary | ICD-10-CM | POA: Diagnosis not present

## 2017-01-24 DIAGNOSIS — Z992 Dependence on renal dialysis: Secondary | ICD-10-CM | POA: Diagnosis not present

## 2017-01-24 DIAGNOSIS — N186 End stage renal disease: Secondary | ICD-10-CM | POA: Diagnosis not present

## 2017-01-26 ENCOUNTER — Encounter (HOSPITAL_COMMUNITY)
Admission: RE | Admit: 2017-01-26 | Discharge: 2017-01-26 | Disposition: A | Discharge status: Home / Self Care | Payer: Medicare Other | Source: Skilled Nursing Facility | Attending: Internal Medicine | Admitting: Internal Medicine

## 2017-01-26 DIAGNOSIS — I82401 Acute embolism and thrombosis of unspecified deep veins of right lower extremity: Secondary | ICD-10-CM | POA: Insufficient documentation

## 2017-01-26 DIAGNOSIS — N401 Enlarged prostate with lower urinary tract symptoms: Secondary | ICD-10-CM | POA: Insufficient documentation

## 2017-01-26 DIAGNOSIS — X58XXXD Exposure to other specified factors, subsequent encounter: Secondary | ICD-10-CM | POA: Diagnosis not present

## 2017-01-26 DIAGNOSIS — Z7901 Long term (current) use of anticoagulants: Secondary | ICD-10-CM | POA: Insufficient documentation

## 2017-01-26 DIAGNOSIS — S72421D Displaced fracture of lateral condyle of right femur, subsequent encounter for closed fracture with routine healing: Secondary | ICD-10-CM | POA: Diagnosis not present

## 2017-01-26 LAB — PROTIME-INR
INR: 2.65
Prothrombin Time: 28.8 seconds — ABNORMAL HIGH (ref 11.4–15.2)

## 2017-01-27 DIAGNOSIS — Z992 Dependence on renal dialysis: Secondary | ICD-10-CM | POA: Diagnosis not present

## 2017-01-27 DIAGNOSIS — E162 Hypoglycemia, unspecified: Secondary | ICD-10-CM | POA: Diagnosis not present

## 2017-01-27 DIAGNOSIS — N2581 Secondary hyperparathyroidism of renal origin: Secondary | ICD-10-CM | POA: Diagnosis not present

## 2017-01-27 DIAGNOSIS — E11649 Type 2 diabetes mellitus with hypoglycemia without coma: Secondary | ICD-10-CM | POA: Diagnosis not present

## 2017-01-27 DIAGNOSIS — D631 Anemia in chronic kidney disease: Secondary | ICD-10-CM | POA: Diagnosis not present

## 2017-01-27 DIAGNOSIS — N186 End stage renal disease: Secondary | ICD-10-CM | POA: Diagnosis not present

## 2017-01-27 DIAGNOSIS — D509 Iron deficiency anemia, unspecified: Secondary | ICD-10-CM | POA: Diagnosis not present

## 2017-01-29 DIAGNOSIS — Z992 Dependence on renal dialysis: Secondary | ICD-10-CM | POA: Diagnosis not present

## 2017-01-29 DIAGNOSIS — E162 Hypoglycemia, unspecified: Secondary | ICD-10-CM | POA: Diagnosis not present

## 2017-01-29 DIAGNOSIS — N2581 Secondary hyperparathyroidism of renal origin: Secondary | ICD-10-CM | POA: Diagnosis not present

## 2017-01-29 DIAGNOSIS — N186 End stage renal disease: Secondary | ICD-10-CM | POA: Diagnosis not present

## 2017-01-29 DIAGNOSIS — D509 Iron deficiency anemia, unspecified: Secondary | ICD-10-CM | POA: Diagnosis not present

## 2017-01-29 DIAGNOSIS — D631 Anemia in chronic kidney disease: Secondary | ICD-10-CM | POA: Diagnosis not present

## 2017-01-31 DIAGNOSIS — N2581 Secondary hyperparathyroidism of renal origin: Secondary | ICD-10-CM | POA: Diagnosis not present

## 2017-01-31 DIAGNOSIS — E162 Hypoglycemia, unspecified: Secondary | ICD-10-CM | POA: Diagnosis not present

## 2017-01-31 DIAGNOSIS — Z992 Dependence on renal dialysis: Secondary | ICD-10-CM | POA: Diagnosis not present

## 2017-01-31 DIAGNOSIS — N186 End stage renal disease: Secondary | ICD-10-CM | POA: Diagnosis not present

## 2017-01-31 DIAGNOSIS — D509 Iron deficiency anemia, unspecified: Secondary | ICD-10-CM | POA: Diagnosis not present

## 2017-01-31 DIAGNOSIS — D631 Anemia in chronic kidney disease: Secondary | ICD-10-CM | POA: Diagnosis not present

## 2017-02-02 ENCOUNTER — Encounter (HOSPITAL_COMMUNITY)
Admission: RE | Admit: 2017-02-02 | Discharge: 2017-02-02 | Disposition: A | Discharge status: Home / Self Care | Payer: Medicare Other | Source: Skilled Nursing Facility | Attending: Internal Medicine | Admitting: Internal Medicine

## 2017-02-02 ENCOUNTER — Non-Acute Institutional Stay (SKILLED_NURSING_FACILITY): Payer: Medicare Other | Admitting: Internal Medicine

## 2017-02-02 ENCOUNTER — Encounter: Payer: Self-pay | Admitting: Internal Medicine

## 2017-02-02 DIAGNOSIS — X58XXXD Exposure to other specified factors, subsequent encounter: Secondary | ICD-10-CM | POA: Insufficient documentation

## 2017-02-02 DIAGNOSIS — I82401 Acute embolism and thrombosis of unspecified deep veins of right lower extremity: Secondary | ICD-10-CM | POA: Diagnosis not present

## 2017-02-02 DIAGNOSIS — E1165 Type 2 diabetes mellitus with hyperglycemia: Secondary | ICD-10-CM | POA: Diagnosis not present

## 2017-02-02 DIAGNOSIS — IMO0002 Reserved for concepts with insufficient information to code with codable children: Secondary | ICD-10-CM

## 2017-02-02 DIAGNOSIS — D509 Iron deficiency anemia, unspecified: Secondary | ICD-10-CM

## 2017-02-02 DIAGNOSIS — N401 Enlarged prostate with lower urinary tract symptoms: Secondary | ICD-10-CM | POA: Diagnosis not present

## 2017-02-02 DIAGNOSIS — E1121 Type 2 diabetes mellitus with diabetic nephropathy: Secondary | ICD-10-CM

## 2017-02-02 DIAGNOSIS — S72421D Displaced fracture of lateral condyle of right femur, subsequent encounter for closed fracture with routine healing: Secondary | ICD-10-CM | POA: Insufficient documentation

## 2017-02-02 DIAGNOSIS — S72491A Other fracture of lower end of right femur, initial encounter for closed fracture: Secondary | ICD-10-CM

## 2017-02-02 LAB — PROTIME-INR
INR: 2.62
Prothrombin Time: 28.5 seconds — ABNORMAL HIGH (ref 11.4–15.2)

## 2017-02-02 LAB — CBC WITH DIFFERENTIAL/PLATELET
Basophils Absolute: 0 10*3/uL (ref 0.0–0.1)
Basophils Relative: 0 %
Eosinophils Absolute: 0.2 10*3/uL (ref 0.0–0.7)
Eosinophils Relative: 4 %
HCT: 30.4 % — ABNORMAL LOW (ref 39.0–52.0)
Hemoglobin: 9.6 g/dL — ABNORMAL LOW (ref 13.0–17.0)
Lymphocytes Relative: 32 %
Lymphs Abs: 1.8 10*3/uL (ref 0.7–4.0)
MCH: 33.3 pg (ref 26.0–34.0)
MCHC: 31.6 g/dL (ref 30.0–36.0)
MCV: 105.6 fL — ABNORMAL HIGH (ref 78.0–100.0)
Monocytes Absolute: 0.7 10*3/uL (ref 0.1–1.0)
Monocytes Relative: 12 %
Neutro Abs: 2.9 10*3/uL (ref 1.7–7.7)
Neutrophils Relative %: 52 %
Platelets: 192 10*3/uL (ref 150–400)
RBC: 2.88 MIL/uL — ABNORMAL LOW (ref 4.22–5.81)
RDW: 18.3 % — ABNORMAL HIGH (ref 11.5–15.5)
WBC: 5.5 10*3/uL (ref 4.0–10.5)

## 2017-02-02 NOTE — Progress Notes (Signed)
Location:   [facility] Room Number: 112/D Place of Service:  SNF (31) Provider:  Geralyn Flash, MD  Patient Care Team: [name], MD as PCP - General (Internal Medicine) Cassandria Anger, MD as Consulting Physician (Endocrinology) Fran Lowes, MD as Consulting Physician (Nephrology) Daneil Dolin, MD as Consulting Physician (Gastroenterology)  Extended Emergency Contact Information Primary Emergency Contact: Radene Gunning Address: DSS  Johnnette Litter of [place] Phone: [phone] Work Phone: [phone] Relation: Legal Guardian Secondary Emergency Contact: Savarino,Richard Address: [address]          [place], [place] [address] [place] Phone: [phone] Relation: Brother  Code Status:  Full Code Goals of care: Advanced Directive information Advanced Directives 02/02/2017  Does Patient Have a Medical Advance Directive? Yes  Type of Advance Directive (No Data)  Does patient want to make changes to medical advance directive? No - Patient declined  Copy of [place] in Chart? -  Would patient like information on creating a medical advance directive? No - Patient declined  Pre-existing out of facility DNR order (yellow form or pink MOST form) -     Chief Complaint  Patient presents with  . Medical Management of Chronic Issues    Routine Visit    HPI:  Pt is a 71 y.o. male seen today for medical management of chronic diseases.   Patient was Admitted to SNF after  Right Distal Fracture requiring ORIF. Patient also has h/o ESRD on dialysis, Type 2 Diabetes, Hypothyroidism, anemia and Mild Mental delay. Since being in facility patient was diagnosed with Right Acute DVT and is on chronic coumadin now. He also Had Anemia with occult positive Stool. He also was evaluated by Ortho and has to wear Knee immobilizer all the time. If the healing is not complete in 6 weeks they have to  consider redoing the alinement. Patient is sitting in Wheel Chair and stays NWB in that leg. He is not c/o any pain, swelling or discomfort in that leg. He only wanted to know how long before he can go back to his Old facility.     Past Medical History:  Diagnosis Date  . Abnormal CT scan, kidney 10/06/2011  . Acute pyelonephritis 10/07/2011  . Anemia    normocytic  . Anxiety    mental retardation  . Bladder wall thickening 10/06/2011  . BPH (benign prostatic hypertrophy)   . Diabetes mellitus   . Edema     history of lower extremity edema  . Heme positive stool   . Hydronephrosis   . Hyperkalemia   . Hyperlipidemia   . Hypernatremia   . Hypertension   . Hypothyroidism   . Impaired speech   . 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 MR (mental retardation)   . Obstructive uropathy   . 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 Poor historian poor historian  . Protein calorie malnutrition ([place])   . Pyelonephritis   . Renal failure (ARF), acute on chronic (HCC)   . Renal insufficiency    chronic history  . Smoking   . Uremia   . Urinary retention   . UTI (lower urinary tract infection) 10/06/2011  . UTI (lower urinary tract infection)    Past Surgical History:  Procedure Laterality Date  . AV FISTULA PLACEMENT Left 07/06/2015   Procedure:  INSERTION LEFT ARM ARTERIOVENOUS GORTEX GRAFT;  Surgeon: Angelia Mould, MD;  Location: [facility];  Service: Vascular;  Laterality: Left;  .  AV FISTULA PLACEMENT Right 02/26/2016   Procedure: ARTERIOVENOUS (AV) FISTULA CREATION ;  Surgeon: Angelia Mould, MD;  Location: [facility];  Service: Vascular;  Laterality: Right;  . [place] REMOVAL Left 10/09/2015   Procedure: REMOVAL OF ARTERIOVENOUS GORETEX GRAFT ([place]) Evacuation of Lymphocele, Vein Patch angioplasty of brachial artery.;  Surgeon: Angelia Mould, MD;  Location: [facility];  Service: Vascular;  Laterality: Left;  . BASCILIC VEIN TRANSPOSITION Right 02/26/2016   Procedure: Right  BASCILIC VEIN TRANSPOSITION;  Surgeon: Angelia Mould, MD;  Location: [facility];  Service: Vascular;  Laterality: Right;  . CIRCUMCISION N/A 01/04/2014   Procedure: CIRCUMCISION ADULT (procedure #1);  Surgeon: Marissa Nestle, MD;  Location: [facility];  Service: Urology;  Laterality: N/A;  . CYSTOSCOPY W/ RETROGRADES Bilateral 06/29/2015   Procedure: CYSTOSCOPY, DILATION OF URETHRAL STRICTURE WITH BILATERAL RETROGRADE PYELOGRAM,SUPRAPUBIC TUBE CHANGE;  Surgeon: Festus Aloe, MD;  Location: [facility];  Service: Urology;  Laterality: Bilateral;  . CYSTOSCOPY WITH URETHRAL DILATATION N/A 12/29/2013   Procedure: CYSTOSCOPY WITH URETHRAL DILATATION;  Surgeon: Marissa Nestle, MD;  [facility];  Service: Urology;  Laterality: N/A;  . ORIF FEMUR FRACTURE Right 11/22/2016   Procedure: OPEN REDUCTION INTERNAL FIXATION (ORIF) DISTAL FEMUR FRACTURE;  Surgeon: Rod Can, MD;  Location: [facility];  Service: Orthopedics;  Laterality: Right;  . PERIPHERAL VASCULAR CATHETERIZATION N/A 10/08/2015   Procedure: A/V Shuntogram;  Surgeon: Angelia Mould, MD;  Location: [facility];  Service: Cardiovascular;  Laterality: N/A;  . TRANSURETHRAL RESECTION OF PROSTATE N/A 01/04/2014   Procedure: TRANSURETHRAL RESECTION OF THE PROSTATE (TURP) (procedure #2);  Surgeon: Marissa Nestle, MD;  [facility];  Service: Urology;  Laterality: N/A;    No Known Allergies  Current Outpatient Prescriptions on File Prior to Visit  Medication Sig Dispense Refill  . Amino Acids-Protein Hydrolys (FEEDING SUPPLEMENT, PRO-STAT 64,) LIQD Take 30 mLs by mouth 2 (two) times daily between meals.    Marland Kitchen atorvastatin (LIPITOR) 40 MG tablet Take 40 mg by mouth every evening.    . carvedilol (COREG) 6.25 MG tablet Take 1 tablet (6.25 mg total) by mouth 2 (two) times daily with a meal. 60 tablet 3  . epoetin alfa (EPOGEN,PROCRIT) 4000 UNIT/ML injection Inject 4,000 Units into the vein every 14 (fourteen) days.    Marland Kitchen  levothyroxine (SYNTHROID, LEVOTHROID) 75 MCG tablet Take 75 mcg by mouth daily before breakfast.     . linagliptin (TRADJENTA) 5 MG TABS tablet Take 2.5 mg by mouth every evening.     . Multiple Vitamin (MULTIVITAMIN WITH MINERALS) TABS tablet Take 1 tablet by mouth every evening.     . Neo-Bacit-Poly-Lidocaine (FIRST AID PLUS LIDOCAINE EX) Apply 1 application topically 3 (three) times a week. Prior to dialysis    . omeprazole (PRILOSEC) 20 MG capsule Take 40 mg by mouth every evening.     . ondansetron (ZOFRAN) 4 MG tablet Take 4 mg by mouth every 8 (eight) hours as needed for nausea or vomiting.    . tamsulosin (FLOMAX) 0.4 MG CAPS capsule Take 0.4 mg by mouth daily.    Marland Kitchen torsemide (DEMADEX) 10 MG tablet Take 5 tablets (50 mg total) by mouth daily. 30 tablet 3   No current facility-administered medications on file prior to visit.      Review of Systems  Review of Systems  Constitutional: Negative for activity change, appetite change, chills, diaphoresis, fatigue and fever.  HENT: Negative for mouth sores, postnasal drip, rhinorrhea, sinus pain and  sore throat.   Respiratory: Negative for apnea, cough, chest tightness, shortness of breath and wheezing.   Cardiovascular: Negative for chest pain, palpitations and leg swelling.  Gastrointestinal: Negative for abdominal distention, abdominal pain, constipation, diarrhea, nausea and vomiting.  Genitourinary: Negative for dysuria and frequency.  Musculoskeletal: Negative for arthralgias, joint swelling and myalgias.  Skin: Negative for rash.  Neurological: Negative for dizziness, syncope, weakness, light-headedness and numbness.  Psychiatric/Behavioral: Negative for behavioral problems, confusion and sleep disturbance.     Immunization History  Administered Date(s) Administered  . Influenza,inj,Quad PF,36+ Mos 12/26/2013, 07/09/2015  . PPD Test 03/07/2015  . Pneumococcal Polysaccharide-23 03/04/2015   Pertinent  Health Maintenance Due    Topic Date Due  . FOOT EXAM  08/23/1956  . OPHTHALMOLOGY EXAM  08/23/1956  . URINE MICROALBUMIN  08/[birth year]/1958  . COLONOSCOPY  08/23/1998  . INFLUENZA VACCINE  09/26/2017 (Originally 05/27/2017)  . PNA vac Low Risk Adult (2 of 2 - PCV13) 09/26/2017 (Originally 03/03/2016)  . HEMOGLOBIN A1C  05/20/2017   No flowsheet data found. Functional Status Survey:    Vitals:   02/02/17 1656  BP: 124/63  Pulse: 87  Temp: 98.1 F (36.7 C)  SpO2: 97%   There is no height or weight on file to calculate BMI. Physical Exam  Constitutional: He appears well-developed and well-nourished.  HENT:  Head: Normocephalic.  Mouth/Throat: Oropharynx is clear and moist.  Eyes: Pupils are equal, round, and reactive to light.  Neck: Neck supple.  Cardiovascular: Normal rate, regular rhythm and normal heart sounds.   No murmur heard. Pulmonary/Chest: Effort normal and breath sounds normal. No respiratory distress. He has no wheezes. He has no rales.  Abdominal: Soft. Bowel sounds are normal. He exhibits no distension. There is no tenderness. There is no rebound.  Musculoskeletal: He exhibits no edema.  Neurological: He is alert.  Skin: Skin is warm and dry.  Psychiatric: He has a normal mood and affect. His behavior is normal. Thought content normal.    Labs reviewed:  Recent Labs  11/24/16 0339 11/25/16 0428 11/26/16 0613  12/26/16 0922 12/28/16 0647 01/04/17 1500  NA 136 136 136  < > 136 137 143  K 5.6* 4.8 4.5  < > 3.7 3.8 3.4*  CL 100* 97* 97*  < > 97* 100* 106  CO2 23 25 24   < > 30 27 27   GLUCOSE 91 112* 88  < > 118* 93 197*  BUN 55* 41* 64*  < > 38* 32* 33*  CREATININE 7.68* 6.47* 8.80*  < > 4.77* 4.25* 3.64*  CALCIUM 8.3* 8.7* 8.3*  < > 8.7* 8.7* 8.7*  PHOS 6.9* 6.7* 8.7*  --   --   --   --   < > = values in this interval not displayed.  Recent Labs  04/03/16 0935  11/20/16 1446  11/25/16 0428 11/26/16 0613 11/28/16 1400  AST 13*  --  27  --   --   --  27  ALT 12*  --  33  --    --   --  <5*  ALKPHOS 66  --  77  --   --   --  68  BILITOT 0.4  --  0.4  --   --   --  0.9  PROT 6.0*  --  7.1  --   --   --  7.6  ALBUMIN 3.0*  < > 3.7  < > 2.6* 2.3* 2.9*  < > = values in this interval not  displayed.  Recent Labs  01/08/17 0700  01/14/17 0730 01/19/17 0700 02/02/17 0700  WBC 5.6  < > 5.4 6.0 5.5  NEUTROABS 3.1  --   --  3.0 2.9  HGB 8.8*  < > 9.4* 9.2* 9.6*  HCT 27.1*  < > 28.6* 28.2* 30.4*  MCV 100.4*  < > 100.4* 103.3* 105.6*  PLT 294  < > 267 253 192  < > = values in this interval not displayed. Lab Results  Component Value Date   TSH 3.004 01/16/2017   Lab Results  Component Value Date   HGBA1C 5.0 11/20/2016   Lab Results  Component Value Date   CHOL 98 01/16/2017   HDL 33 (L) 01/16/2017   LDLCALC 50 01/16/2017   TRIG 77 01/16/2017   CHOLHDL 3.0 01/16/2017    Significant Diagnostic Results in last 30 days:  No results found.  Assessment/Plan Acute deep vein thrombosis (DVT) of right lower extremity On Coumadin now. INR above 2 . Continue same dose of Coumadin and repeat INR in 2 weeks.   Type 2 diabetes mellitus  BS mostly less then 200 . On Tradjenta. His A1 C was 5.0 in 01/18 Repeat A 1C next blood draw.  S/P Distal Fracture of right Femur In immobilizer for 6 weeks with Follow up with Ortho.     Iron deficiency anemia type He was also Occult Blood positive. Has follow up with GI. Hgb has been stable. On EPO with dialysis.  BPH with urinary retention Doing well with Flomax  Hyperlipidemia Continue on Lipitor LDL was 50 in 03/18  Hypothyroidism TSH was normal levels in 03/18 Family/ staff Communication:   Labs/tests ordered:  [facility], INR in 2 weeks.

## 2017-02-03 DIAGNOSIS — D631 Anemia in chronic kidney disease: Secondary | ICD-10-CM | POA: Diagnosis not present

## 2017-02-03 DIAGNOSIS — Z992 Dependence on renal dialysis: Secondary | ICD-10-CM | POA: Diagnosis not present

## 2017-02-03 DIAGNOSIS — D509 Iron deficiency anemia, unspecified: Secondary | ICD-10-CM | POA: Diagnosis not present

## 2017-02-03 DIAGNOSIS — N186 End stage renal disease: Secondary | ICD-10-CM | POA: Diagnosis not present

## 2017-02-03 DIAGNOSIS — E162 Hypoglycemia, unspecified: Secondary | ICD-10-CM | POA: Diagnosis not present

## 2017-02-03 DIAGNOSIS — E119 Type 2 diabetes mellitus without complications: Secondary | ICD-10-CM | POA: Diagnosis not present

## 2017-02-03 DIAGNOSIS — N2581 Secondary hyperparathyroidism of renal origin: Secondary | ICD-10-CM | POA: Diagnosis not present

## 2017-02-05 ENCOUNTER — Ambulatory Visit: Payer: Medicare Other | Admitting: Gastroenterology

## 2017-02-05 DIAGNOSIS — D509 Iron deficiency anemia, unspecified: Secondary | ICD-10-CM | POA: Diagnosis not present

## 2017-02-05 DIAGNOSIS — E162 Hypoglycemia, unspecified: Secondary | ICD-10-CM | POA: Diagnosis not present

## 2017-02-05 DIAGNOSIS — N186 End stage renal disease: Secondary | ICD-10-CM | POA: Diagnosis not present

## 2017-02-05 DIAGNOSIS — Z992 Dependence on renal dialysis: Secondary | ICD-10-CM | POA: Diagnosis not present

## 2017-02-05 DIAGNOSIS — N2581 Secondary hyperparathyroidism of renal origin: Secondary | ICD-10-CM | POA: Diagnosis not present

## 2017-02-05 DIAGNOSIS — D631 Anemia in chronic kidney disease: Secondary | ICD-10-CM | POA: Diagnosis not present

## 2017-02-07 DIAGNOSIS — D631 Anemia in chronic kidney disease: Secondary | ICD-10-CM | POA: Diagnosis not present

## 2017-02-07 DIAGNOSIS — Z992 Dependence on renal dialysis: Secondary | ICD-10-CM | POA: Diagnosis not present

## 2017-02-07 DIAGNOSIS — N186 End stage renal disease: Secondary | ICD-10-CM | POA: Diagnosis not present

## 2017-02-07 DIAGNOSIS — D509 Iron deficiency anemia, unspecified: Secondary | ICD-10-CM | POA: Diagnosis not present

## 2017-02-07 DIAGNOSIS — N2581 Secondary hyperparathyroidism of renal origin: Secondary | ICD-10-CM | POA: Diagnosis not present

## 2017-02-07 DIAGNOSIS — E162 Hypoglycemia, unspecified: Secondary | ICD-10-CM | POA: Diagnosis not present

## 2017-02-10 DIAGNOSIS — Z992 Dependence on renal dialysis: Secondary | ICD-10-CM | POA: Diagnosis not present

## 2017-02-10 DIAGNOSIS — D631 Anemia in chronic kidney disease: Secondary | ICD-10-CM | POA: Diagnosis not present

## 2017-02-10 DIAGNOSIS — E162 Hypoglycemia, unspecified: Secondary | ICD-10-CM | POA: Diagnosis not present

## 2017-02-10 DIAGNOSIS — N2581 Secondary hyperparathyroidism of renal origin: Secondary | ICD-10-CM | POA: Diagnosis not present

## 2017-02-10 DIAGNOSIS — N186 End stage renal disease: Secondary | ICD-10-CM | POA: Diagnosis not present

## 2017-02-10 DIAGNOSIS — D509 Iron deficiency anemia, unspecified: Secondary | ICD-10-CM | POA: Diagnosis not present

## 2017-02-12 DIAGNOSIS — E162 Hypoglycemia, unspecified: Secondary | ICD-10-CM | POA: Diagnosis not present

## 2017-02-12 DIAGNOSIS — N2581 Secondary hyperparathyroidism of renal origin: Secondary | ICD-10-CM | POA: Diagnosis not present

## 2017-02-12 DIAGNOSIS — Z992 Dependence on renal dialysis: Secondary | ICD-10-CM | POA: Diagnosis not present

## 2017-02-12 DIAGNOSIS — D509 Iron deficiency anemia, unspecified: Secondary | ICD-10-CM | POA: Diagnosis not present

## 2017-02-12 DIAGNOSIS — D631 Anemia in chronic kidney disease: Secondary | ICD-10-CM | POA: Diagnosis not present

## 2017-02-12 DIAGNOSIS — N186 End stage renal disease: Secondary | ICD-10-CM | POA: Diagnosis not present

## 2017-02-14 DIAGNOSIS — E162 Hypoglycemia, unspecified: Secondary | ICD-10-CM | POA: Diagnosis not present

## 2017-02-14 DIAGNOSIS — N2581 Secondary hyperparathyroidism of renal origin: Secondary | ICD-10-CM | POA: Diagnosis not present

## 2017-02-14 DIAGNOSIS — N186 End stage renal disease: Secondary | ICD-10-CM | POA: Diagnosis not present

## 2017-02-14 DIAGNOSIS — D631 Anemia in chronic kidney disease: Secondary | ICD-10-CM | POA: Diagnosis not present

## 2017-02-14 DIAGNOSIS — Z992 Dependence on renal dialysis: Secondary | ICD-10-CM | POA: Diagnosis not present

## 2017-02-14 DIAGNOSIS — D509 Iron deficiency anemia, unspecified: Secondary | ICD-10-CM | POA: Diagnosis not present

## 2017-02-16 ENCOUNTER — Encounter: Payer: Self-pay | Admitting: Internal Medicine

## 2017-02-16 ENCOUNTER — Encounter (HOSPITAL_COMMUNITY)
Admission: RE | Admit: 2017-02-16 | Discharge: 2017-02-16 | Disposition: A | Discharge status: Home / Self Care | Payer: Medicare Other | Source: Skilled Nursing Facility | Attending: Internal Medicine | Admitting: Internal Medicine

## 2017-02-16 ENCOUNTER — Non-Acute Institutional Stay (SKILLED_NURSING_FACILITY): Payer: Medicare Other | Admitting: Internal Medicine

## 2017-02-16 DIAGNOSIS — I82401 Acute embolism and thrombosis of unspecified deep veins of right lower extremity: Secondary | ICD-10-CM | POA: Insufficient documentation

## 2017-02-16 DIAGNOSIS — E1121 Type 2 diabetes mellitus with diabetic nephropathy: Secondary | ICD-10-CM | POA: Diagnosis not present

## 2017-02-16 DIAGNOSIS — D638 Anemia in other chronic diseases classified elsewhere: Secondary | ICD-10-CM | POA: Diagnosis not present

## 2017-02-16 DIAGNOSIS — S72421D Displaced fracture of lateral condyle of right femur, subsequent encounter for closed fracture with routine healing: Secondary | ICD-10-CM | POA: Diagnosis not present

## 2017-02-16 DIAGNOSIS — N401 Enlarged prostate with lower urinary tract symptoms: Secondary | ICD-10-CM | POA: Insufficient documentation

## 2017-02-16 DIAGNOSIS — X58XXXD Exposure to other specified factors, subsequent encounter: Secondary | ICD-10-CM | POA: Diagnosis not present

## 2017-02-16 DIAGNOSIS — Z7901 Long term (current) use of anticoagulants: Secondary | ICD-10-CM

## 2017-02-16 DIAGNOSIS — Z5181 Encounter for therapeutic drug level monitoring: Secondary | ICD-10-CM | POA: Diagnosis not present

## 2017-02-16 LAB — PROTIME-INR
INR: 1.37
Prothrombin Time: 17 seconds — ABNORMAL HIGH (ref 11.4–15.2)

## 2017-02-16 NOTE — Progress Notes (Signed)
Location:   [facility] Room Number: 112/D Place of Service:  SNF (31) Provider:  Glennon Hamilton, MD  Patient Care Team: [name], MD as PCP - General (Internal Medicine) Cassandria Anger, MD as Consulting Physician (Endocrinology) Fran Lowes, MD as Consulting Physician (Nephrology) Daneil Dolin, MD as Consulting Physician (Gastroenterology)  Extended Emergency Contact Information Primary Emergency Contact: Radene Gunning Address: DSS  Johnnette Litter of [place] Phone: [phone] Work Phone: [phone] Relation: Legal Guardian Secondary Emergency Contact: Woolsey,Richard Address: [address]          [place], [place] [address] [place] of [place] Phone: [phone] Relation: Brother  Code Status:  Full Code Goals of care: Advanced Directive information Advanced Directives 02/16/2017  Does Patient Have a Medical Advance Directive? No  Type of Advance Directive (No Data)  Does patient want to make changes to medical advance directive? No - Patient declined  Copy of [place] in Chart? -  Would patient like information on creating a medical advance directive? No - Patient declined  Pre-existing out of facility DNR order (yellow form or pink MOST form) -     Chief Complaint  Patient presents with  . Acute Visit    Anticoagulation  encounter with history of DVT  HPI:  Pt is a 71 y.o. male seen today for an acute visit for anticoagulation management with history of DVT He does have a history of a recent right lower extremity DVT and is on chronic Coumadin-first getting his INR to therapeutic levels a challenge but this has stabilized for now for some time for was controlled INR was 2.4--3 weeks ago was 2 .65 and 2 weeks ago was 2.62.  However INR today is 1.37--is somewhat vague whether he may have missed some Coumadin doses.  Clinically he appears to be stable.--He was here for  rehabilitation after sustaining a distal fracture of the right femur he currently has an immobilizer Place and followed by orthopedics    He is currently on 4.5 mg of Coumadin a day.  He does have some history of GI bleed in the past but hemoglobin has been stable most recently 9.6 on lab done 04/09/201 he is on Epogen at [facility] well as a proton pump inhibitor. He does have GI follow-up scheduled      Past Medical History:  Diagnosis Date  . Abnormal CT scan, kidney 10/06/2011  . Acute pyelonephritis 10/07/2011  . Anemia    normocytic  . Anxiety    mental retardation  . Bladder wall thickening 10/06/2011  . BPH (benign prostatic hypertrophy)   . Diabetes mellitus   . Edema     history of lower extremity edema  . Heme positive stool   . Hydronephrosis   . Hyperkalemia   . Hyperlipidemia   . Hypernatremia   . Hypertension   . Hypothyroidism   . Impaired speech   . 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[facility])   . MR (mental retardation)   . Obstructive uropathy   . 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 Poor historian poor historian  . Protein calorie malnutrition ([place] [facility])   . Pyelonephritis   . Renal failure (ARF), acute on chronic (HCC)   . Renal insufficiency    chronic history  . Smoking   . Uremia   . Urinary retention   . UTI (lower urinary tract infection) 10/06/2011  . UTI (lower urinary tract infection)    Past Surgical History:  Procedure Laterality Date  .  AV FISTULA PLACEMENT Left 07/06/2015   Procedure:  INSERTION LEFT ARM ARTERIOVENOUS GORTEX GRAFT;  Surgeon: Angelia Mould, MD;  Location: [facility];  Service: Vascular;  Laterality: Left;  . AV FISTULA PLACEMENT Right 02/26/2016   Procedure: ARTERIOVENOUS (AV) FISTULA CREATION ;  Surgeon: Angelia Mould, MD;  Location: [facility];  Service: Vascular;  Laterality: Right;  . [place] REMOVAL Left 10/09/2015   Procedure: REMOVAL OF ARTERIOVENOUS GORETEX GRAFT ([place]) Evacuation of Lymphocele, Vein Patch  angioplasty of brachial artery.;  Surgeon: Angelia Mould, MD;  Location: [facility];  Service: Vascular;  Laterality: Left;  . BASCILIC VEIN TRANSPOSITION Right 02/26/2016   Procedure: Right BASCILIC VEIN TRANSPOSITION;  Surgeon: Angelia Mould, MD;  Location: [facility];  Service: Vascular;  Laterality: Right;  . CIRCUMCISION N/A 01/04/2014   Procedure: CIRCUMCISION ADULT (procedure #1);  Surgeon: Marissa Nestle, MD;  [facility];  Service: Urology;  Laterality: N/A;  . CYSTOSCOPY W/ RETROGRADES Bilateral 06/29/2015   Procedure: CYSTOSCOPY, DILATION OF URETHRAL STRICTURE WITH BILATERAL RETROGRADE PYELOGRAM,SUPRAPUBIC TUBE CHANGE;  Surgeon: Festus Aloe, MD;  [facility];  Service: Urology;  Laterality: Bilateral;  . CYSTOSCOPY WITH URETHRAL DILATATION N/A 12/29/2013   Procedure: CYSTOSCOPY WITH URETHRAL DILATATION;  Surgeon: Marissa Nestle, MD;  [facility];  Service: Urology;  Laterality: N/A;  . ORIF FEMUR FRACTURE Right 11/22/2016   Procedure: OPEN REDUCTION INTERNAL FIXATION (ORIF) DISTAL FEMUR FRACTURE;  Surgeon: Rod Can, MD;  Location: [facility];  Service: Orthopedics;  Laterality: Right;  . PERIPHERAL VASCULAR CATHETERIZATION N/A 10/08/2015   Procedure: A/V Shuntogram;  Surgeon: Angelia Mould, MD;  Location: [facility];  Service: Cardiovascular;  Laterality: N/A;  . TRANSURETHRAL RESECTION OF PROSTATE N/A 01/04/2014   Procedure: TRANSURETHRAL RESECTION OF THE PROSTATE (TURP) (procedure #2);  Surgeon: Marissa Nestle, MD;  [facility];  Service: Urology;  Laterality: N/A;    No Known Allergies  Outpatient Encounter Prescriptions as of 02/16/2017  Medication Sig  . Amino Acids-Protein Hydrolys (FEEDING SUPPLEMENT, PRO-STAT 64,) LIQD Take 30 mLs by mouth 2 (two) times daily between meals.  Marland Kitchen atorvastatin (LIPITOR) 40 MG tablet Take 40 mg by mouth every evening.  . carvedilol (COREG) 6.25 MG tablet Take 1 tablet (6.25 mg total) by mouth 2 (two)  times daily with a meal.  . epoetin alfa (EPOGEN,PROCRIT) 4000 UNIT/ML injection Inject 4,000 Units into the vein every 14 (fourteen) days.  Marland Kitchen levothyroxine (SYNTHROID, LEVOTHROID) 75 MCG tablet Take 75 mcg by mouth daily before breakfast.   . linagliptin (TRADJENTA) 5 MG TABS tablet Take 2.5 mg by mouth every evening.   . Multiple Vitamin (MULTIVITAMIN WITH MINERALS) TABS tablet Take 1 tablet by mouth every evening.   . Neo-Bacit-Poly-Lidocaine (FIRST AID PLUS LIDOCAINE EX) Apply 1 application topically 3 (three) times a week. Prior to dialysis  . Nutritional Supplements (NEPRO) LIQD Take 1 can by mouth twice a day  . omeprazole (PRILOSEC) 20 MG capsule Take 40 mg by mouth every evening.   . ondansetron (ZOFRAN) 4 MG tablet Take 4 mg by mouth every 8 (eight) hours as needed for nausea or vomiting.  . tamsulosin (FLOMAX) 0.4 MG CAPS capsule Take 0.4 mg by mouth daily.  Marland Kitchen torsemide (DEMADEX) 10 MG tablet Take 5 tablets (50 mg total) by mouth daily.  Marland Kitchen warfarin (COUMADIN) 1 MG tablet Take 0.5 mg tablet by mouth along with 4 mg to equal 4.5 mg once a day  . warfarin (COUMADIN) 4 MG tablet  Take 4 mg tablet along with 0.5 to equal 4.5 mg once a day   No facility-administered encounter medications on file as of 02/16/2017.     Review of Systems   General does not complain of any fever or chills. SKIN is not complaining of increased bruising or bleeding.  Head ears eyes nose mouth and throat does not complaining of any visual changes or sore throat.  Respiratory no complaints of shortness breath or cough.  Cardiac denies chest pain.  GI is not complaining of any abdominal discomfort nausea vomiting diarrhea or constipation-he has a very good appetite.  Muscle skeletal does not complain of leg pain currently.  Or joint pain.  Neurologic is not complaining of dizziness headache or numbness.  Psych does have some history of mild mental deficits  Immunization History  Administered Date(s)  Administered  . Influenza,inj,Quad PF,36+ Mos 12/26/2013, 07/09/2015  . PPD Test 03/07/2015  . Pneumococcal Polysaccharide-23 03/04/2015   Pertinent  Health Maintenance Due  Topic Date Due  . FOOT EXAM  08/23/1956  . OPHTHALMOLOGY EXAM  08/23/1956  . URINE MICROALBUMIN  08[birth year]/1958  . COLONOSCOPY  08/23/1998  . INFLUENZA VACCINE  09/26/2017 (Originally 05/27/2017)  . PNA vac Low Risk Adult (2 of 2 - PCV13) 09/26/2017 (Originally 03/03/2016)  . HEMOGLOBIN A1C  05/20/2017   No flowsheet data found. Functional Status Survey:    Vitals:   02/16/17 1353  BP: 100/68  Pulse: 87  Resp: 18  Temp: 98.4 F (36.9 C)  TempSrc: Oral  SpO2: 97%    Physical Exam   In general this is a well-nourishee elderly male in no distress sitting complaining his wheelchair.  His skin is warm and dry.  Oropharynx clear mucous membranes moist.  Chest is clear to auscultation there is no labored breathing.  Heart is regular rate and rhythm without murmur gallop or rub.  Abdomen soft nontender positive bowel sounds.  Musculoskeletal does have his right lower extremity in an immobilizer-he has fairly minimal edema pedal pulse is intact.  Neurologic is grossly intact to speech is clear no lateralizing findings.  Psych he is largely alert and oriented pleasant and appropriate when asked questions Labs reviewed:  Recent Labs  11/24/16 0339 11/25/16 0428 11/26/16 0613  12/26/16 0922 12/28/16 0647 01/04/17 1500  NA 136 136 136  < > 136 137 143  K 5.6* 4.8 4.5  < > 3.7 3.8 3.4*  CL 100* 97* 97*  < > 97* 100* 106  CO2 23 25 24   < > 30 27 27   GLUCOSE 91 112* 88  < > 118* 93 197*  BUN 55* 41* 64*  < > 38* 32* 33*  CREATININE 7.68* 6.47* 8.80*  < > 4.77* 4.25* 3.64*  CALCIUM 8.3* 8.7* 8.3*  < > 8.7* 8.7* 8.7*  PHOS 6.9* 6.7* 8.7*  --   --   --   --   < > = values in this interval not displayed.  Recent Labs  04/03/16 0935  11/20/16 1446  11/25/16 0428 11/26/16 0613 11/28/16 1400  AST 13*   --  27  --   --   --  27  ALT 12*  --  33  --   --   --  <5*  ALKPHOS 66  --  77  --   --   --  68  BILITOT 0.4  --  0.4  --   --   --  0.9  PROT 6.0*  --  7.1  --   --   --  7.6  ALBUMIN 3.0*  < > 3.7  < > 2.6* 2.3* 2.9*  < > = values in this interval not displayed.  Recent Labs  01/08/17 0700  01/14/17 0730 01/19/17 0700 02/02/17 0700  WBC 5.6  < > 5.4 6.0 5.5  NEUTROABS 3.1  --   --  3.0 2.9  HGB 8.8*  < > 9.4* 9.2* 9.6*  HCT 27.1*  < > 28.6* 28.2* 30.4*  MCV 100.4*  < > 100.4* 103.3* 105.6*  PLT 294  < > 267 253 192  < > = values in this interval not displayed. Lab Results  Component Value Date   TSH 3.004 01/16/2017   Lab Results  Component Value Date   HGBA1C 5.0 11/20/2016   Lab Results  Component Value Date   CHOL 98 01/16/2017   HDL 33 (L) 01/16/2017   LDLCALC 50 01/16/2017   TRIG 77 01/16/2017   CHOLHDL 3.0 01/16/2017    Significant Diagnostic Results in last 30 days:  No results found.  Assessment/Plan  Anticoagulation management with history of right lower extremity DVT-INR is now subtherapeutic-will give him 7.5 mg of Coumadin tonight and then continue previous dose of 4.5 mg a day-there is some suspicion he possibly may have missed a few doses although this cannot be confirmed-nonetheless will give him the higher dose this evening and recheck an INR on Thursday.  #2 history of anemia this appears stabilized with a hemoglobin of 9.6 earlier this month-he is on Epogen at dialysis-he is also on a proton pump inhibitor with a history of GI bleed-GI consult is pending  #3 history of diabetes type 2 he is on Tradjenta blood sugars appear to be largely in the 90s to low 100s--hemoglobin A1c was 5 in January--an updated hemoglobin A1c is pending   [id number]        Oralia Man[place], [place]

## 2017-02-17 DIAGNOSIS — N186 End stage renal disease: Secondary | ICD-10-CM | POA: Diagnosis not present

## 2017-02-17 DIAGNOSIS — Z7984 Long term (current) use of oral hypoglycemic drugs: Secondary | ICD-10-CM | POA: Diagnosis not present

## 2017-02-17 DIAGNOSIS — Z992 Dependence on renal dialysis: Secondary | ICD-10-CM | POA: Diagnosis not present

## 2017-02-17 DIAGNOSIS — H2513 Age-related nuclear cataract, bilateral: Secondary | ICD-10-CM | POA: Diagnosis not present

## 2017-02-17 DIAGNOSIS — D509 Iron deficiency anemia, unspecified: Secondary | ICD-10-CM | POA: Diagnosis not present

## 2017-02-17 DIAGNOSIS — N2581 Secondary hyperparathyroidism of renal origin: Secondary | ICD-10-CM | POA: Diagnosis not present

## 2017-02-17 DIAGNOSIS — Z7901 Long term (current) use of anticoagulants: Secondary | ICD-10-CM | POA: Diagnosis not present

## 2017-02-17 DIAGNOSIS — D631 Anemia in chronic kidney disease: Secondary | ICD-10-CM | POA: Diagnosis not present

## 2017-02-17 DIAGNOSIS — E119 Type 2 diabetes mellitus without complications: Secondary | ICD-10-CM | POA: Diagnosis not present

## 2017-02-17 DIAGNOSIS — E162 Hypoglycemia, unspecified: Secondary | ICD-10-CM | POA: Diagnosis not present

## 2017-02-17 LAB — HEMOGLOBIN A1C
Hgb A1c MFr Bld: 4.8 % (ref 4.8–5.6)
Mean Plasma Glucose: 91 mg/dL

## 2017-02-18 ENCOUNTER — Ambulatory Visit (INDEPENDENT_AMBULATORY_CARE_PROVIDER_SITE_OTHER): Payer: Medicare Other | Admitting: Gastroenterology

## 2017-02-18 ENCOUNTER — Encounter: Payer: Self-pay | Admitting: Gastroenterology

## 2017-02-18 VITALS — BP 95/58 | HR 90 | Temp 99.2°F | Ht 72.0 in

## 2017-02-18 DIAGNOSIS — R195 Other fecal abnormalities: Secondary | ICD-10-CM

## 2017-02-18 DIAGNOSIS — D638 Anemia in other chronic diseases classified elsewhere: Secondary | ICD-10-CM

## 2017-02-18 DIAGNOSIS — Z7901 Long term (current) use of anticoagulants: Secondary | ICD-10-CM | POA: Diagnosis not present

## 2017-02-18 NOTE — Progress Notes (Signed)
Primary Care Physician:  [name], MD Referring provider: Granville Lewis, PA-C at [place]. Primary Gastroenterologist:  Garfield Cornea, MD   Chief Complaint  Patient presents with  . Blood In Stools    HPI:  Zachary [name] is a 71 y.o. male here at the request of Lorne Skeens at [facility] for heme positive stool 2 on 01/04/2017.  Patient sustained a right  distal femur fracture back in [place], complicated by right femoral DVT now on Coumadin (diagnosed 12/24/2016). He also has End-stage renal disease, BPH, chronic anemia on Epogen and iron.    Patient is somewhat poor historian. He appears to comprehend questions but has limited answers. 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 He reports no prior colonoscopy or endoscopy which were explained in detail to him. He denies melena, rectal bleeding, abdominal pain, constipation, diarrhea, heartburn, dysphagia, vomiting.  His hemoglobin has been basically stable since his surgery in January. His MCV has been climbing however. Previously normal in March and now 105.6. No recent B12, folate, ferritin on file. Serum iron was low at less than 5 in January. TIBC was 148.  Patient has a legal guardian, [name]. Phone number [phone] extension [phone]   Current Outpatient Prescriptions on File Prior to Visit  Medication Sig Dispense Refill  . Amino Acids-Protein Hydrolys (FEEDING SUPPLEMENT, PRO-STAT 64,) LIQD Take 30 mLs by mouth 2 (two) times daily between meals.    Marland Kitchen atorvastatin (LIPITOR) 40 MG tablet Take 40 mg by mouth every evening.    . carvedilol (COREG) 6.25 MG tablet Take 1 tablet (6.25 mg total) by mouth 2 (two) times daily with a meal. 60 tablet 3  . epoetin alfa (EPOGEN,PROCRIT) 4000 UNIT/ML injection Inject 4,000 Units into the vein every 14 (fourteen) days.    Marland Kitchen levothyroxine (SYNTHROID, LEVOTHROID) 75 MCG tablet Take 75 mcg by mouth daily before breakfast.     . linagliptin (TRADJENTA) 5 MG TABS  tablet Take 2.5 mg by mouth every evening.     . Multiple Vitamin (MULTIVITAMIN WITH MINERALS) TABS tablet Take 1 tablet by mouth every evening.     . Neo-Bacit-Poly-Lidocaine (FIRST AID PLUS LIDOCAINE EX) Apply 1 application topically 3 (three) times a week. Prior to dialysis    . Nutritional Supplements (NEPRO) LIQD Take 1 can by mouth twice a day    . omeprazole (PRILOSEC) 20 MG capsule Take 40 mg by mouth every evening.     . ondansetron (ZOFRAN) 4 MG tablet Take 4 mg by mouth every 8 (eight) hours as needed for nausea or vomiting.    . tamsulosin (FLOMAX) 0.4 MG CAPS capsule Take 0.4 mg by mouth daily.    Marland Kitchen torsemide (DEMADEX) 10 MG tablet Take 5 tablets (50 mg total) by mouth daily. 30 tablet 3  . warfarin (COUMADIN) 1 MG tablet Take 0.5 mg tablet by mouth along with 4 mg to equal 4.5 mg once a day    . warfarin (COUMADIN) 4 MG tablet Take 4 mg tablet along with 0.5 to equal 4.5 mg once a day     No current facility-administered medications on file prior to visit.        Allergies as of 02/18/2017  . (No Known Allergies)    Past Medical History:  Diagnosis Date  . Abnormal CT scan, kidney 10/06/2011  . Acute pyelonephritis 10/07/2011  . Anemia    normocytic  . Anxiety    mental retardation  . Bladder wall thickening 10/06/2011  . BPH (  benign prostatic hypertrophy)   . Diabetes mellitus   . Edema     history of lower extremity edema  . Heme positive stool   . Hydronephrosis   . Hyperkalemia   . Hyperlipidemia   . Hypernatremia   . Hypertension   . Hypothyroidism   . Impaired speech   . 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 MR (mental retardation)   . Obstructive uropathy   . 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 Poor historian poor historian  . Protein calorie malnutrition ([place])   . Pyelonephritis   . Renal failure (ARF), acute on chronic (HCC)   . Renal insufficiency    chronic history  . Smoking   . Uremia   . Urinary retention   . UTI (lower urinary  tract infection) 10/06/2011  . UTI (lower urinary tract infection)     Past Surgical History:  Procedure Laterality Date  . AV FISTULA PLACEMENT Left 07/06/2015   Procedure:  INSERTION LEFT ARM ARTERIOVENOUS GORTEX GRAFT;  Surgeon: Angelia Mould, MD;  Location: [place];  Service: Vascular;  Laterality: Left;  . AV FISTULA PLACEMENT Right 02/26/2016   Procedure: ARTERIOVENOUS (AV) FISTULA CREATION ;  Surgeon: Angelia Mould, MD;  Location: [place];  Service: Vascular;  Laterality: Right;  . [place] REMOVAL Left 10/09/2015   Procedure: REMOVAL OF ARTERIOVENOUS GORETEX GRAFT ([place]) Evacuation of Lymphocele, Vein Patch angioplasty of brachial artery.;  Surgeon: Angelia Mould, MD;  Location: [place];  Service: Vascular;  Laterality: Left;  . BASCILIC VEIN TRANSPOSITION Right 02/26/2016   Procedure: Right BASCILIC VEIN TRANSPOSITION;  Surgeon: Angelia Mould, MD;  Location: [place];  Service: Vascular;  Laterality: Right;  . CIRCUMCISION N/A 01/04/2014   Procedure: CIRCUMCISION ADULT (procedure #1);  Surgeon: Marissa Nestle, MD;  Location: [facility];  Service: Urology;  Laterality: N/A;  . CYSTOSCOPY W/ RETROGRADES Bilateral 06/29/2015   Procedure: CYSTOSCOPY, DILATION OF URETHRAL STRICTURE WITH BILATERAL RETROGRADE PYELOGRAM,SUPRAPUBIC TUBE CHANGE;  Surgeon: Festus Aloe, MD;  Location: [facility];  Service: Urology;  Laterality: Bilateral;  . CYSTOSCOPY WITH URETHRAL DILATATION N/A 12/29/2013   Procedure: CYSTOSCOPY WITH URETHRAL DILATATION;  Surgeon: Marissa Nestle, MD;  Location: [facility];  Service: Urology;  Laterality: N/A;  . ORIF FEMUR FRACTURE Right 11/22/2016   Procedure: OPEN REDUCTION INTERNAL FIXATION (ORIF) DISTAL FEMUR FRACTURE;  Surgeon: Rod Can, MD;  Location: [place];  Service: Orthopedics;  Laterality: Right;  . PERIPHERAL VASCULAR CATHETERIZATION N/A 10/08/2015   Procedure: A/V Shuntogram;  Surgeon: Angelia Mould, MD;  Location: [place] [facility];  Service:  Cardiovascular;  Laterality: N/A;  . TRANSURETHRAL RESECTION OF PROSTATE N/A 01/04/2014   Procedure: TRANSURETHRAL RESECTION OF THE PROSTATE (TURP) (procedure #2);  Surgeon: Marissa Nestle, MD;  Location: [facility];  Service: Urology;  Laterality: N/A;    Family History  Problem Relation Age of Onset  . Cancer Mother   . Colon cancer Neg Hx     Social History   Social History  . Marital status: Single    Spouse name: N/A  . Number of children: N/A  . Years of education: N/A   Occupational History  . Not on file.   Social History Main Topics  . Smoking status: Never Smoker  . Smokeless tobacco: Never Used  . Alcohol use No     Comment: occ. use   . Drug use: No  . Sexual activity: No   Other Topics Concern  . Not on file  Social History Narrative   Lives at nursing home.      ROS:  General: Negative for anorexia, weight loss, fever, chills, fatigue, weakness. Eyes: Negative for vision changes.  ENT: Negative for hoarseness, difficulty swallowing , nasal congestion. CV: Negative for chest pain, angina, palpitations, dyspnea on exertion, peripheral edema.  Respiratory: Negative for dyspnea at rest, dyspnea on exertion, cough, sputum, wheezing.  GI: See history of present illness. GU:  Negative for dysuria, hematuria, urinary incontinence, urinary frequency, nocturnal urination.  MS: Negative for joint pain, low back pain.  Derm: Negative for rash or itching.  Neuro: Negative for weakness, abnormal sensation, seizure, frequent headaches, memory loss, confusion.  Psych: Negative for anxiety, depression, suicidal ideation, hallucinations.  Endo: Negative for unusual weight change.  Heme: Negative for bruising or bleeding. Allergy: Negative for rash or hives.    Physical Examination:  BP (!) 95/58   Pulse 90   Temp 99.2 F (37.3 C) (Oral)   Ht 6' (1.829 m)    General: Well-nourished, well-developed in no acute distress. Examined in wheelchair due to inability  to get on table Head: Normocephalic, atraumatic.   Eyes: Conjunctiva pink, no icterus. Mouth: Oropharyngeal mucosa moist and pink , no lesions erythema or exudate. Neck: Supple without thyromegaly, masses, or lymphadenopathy.  Lungs: Clear to auscultation bilaterally.  Heart: Regular rate and rhythm, no murmurs rubs or gallops.  Abdomen: Bowel sounds are normal, nontender, nondistended, no hepatosplenomegaly or masses, no abdominal bruits or    hernia , no rebound or guarding.   Rectal: could not perform Extremities: No lower extremity edema. No clubbing or deformities.  Neuro: Alert and oriented x 4 , grossly normal neurologically.  Skin: Warm and dry, no rash or jaundice.   Psych: Alert and cooperative, normal mood and affect.  Labs: Lab Results  Component Value Date   WBC 5.5 02/02/2017   HGB 9.6 (L) 02/02/2017   HCT 30.4 (L) 02/02/2017   MCV 105.6 (H) 02/02/2017   PLT 192 02/02/2017   Lab Results  Component Value Date   CREATININE 3.64 (H) 01/04/2017   BUN 33 (H) 01/04/2017   NA 143 01/04/2017   K 3.4 (L) 01/04/2017   CL 106 01/04/2017   CO2 27 01/04/2017   Lab Results  Component Value Date   ALT <5 (L) 11/28/2016   AST 27 11/28/2016   ALKPHOS 68 11/28/2016   BILITOT 0.9 11/28/2016   Lab Results  Component Value Date   IRON <5 (L) 11/23/2016   TIBC 148 (L) 11/23/2016          Lab Results  Component Value Date   INR 3.07 02/19/2017   INR 1.37 02/16/2017   INR 2.62 02/02/2017     Imaging Studies: No results found.

## 2017-02-18 NOTE — Patient Instructions (Addendum)
1. I will need to discuss with your nursing home provider and Dr. Gala Romney about colonoscopy +/- upper endoscopy for heme + stool with recent DVT prior to scheduling.

## 2017-02-19 ENCOUNTER — Other Ambulatory Visit (HOSPITAL_COMMUNITY)
Admission: RE | Admit: 2017-02-19 | Discharge: 2017-02-19 | Disposition: A | Discharge status: Home / Self Care | Payer: Medicare Other | Source: Skilled Nursing Facility | Attending: Internal Medicine | Admitting: Internal Medicine

## 2017-02-19 ENCOUNTER — Encounter: Payer: Self-pay | Admitting: Gastroenterology

## 2017-02-19 DIAGNOSIS — E162 Hypoglycemia, unspecified: Secondary | ICD-10-CM | POA: Diagnosis not present

## 2017-02-19 DIAGNOSIS — D509 Iron deficiency anemia, unspecified: Secondary | ICD-10-CM | POA: Diagnosis not present

## 2017-02-19 DIAGNOSIS — Z992 Dependence on renal dialysis: Secondary | ICD-10-CM | POA: Diagnosis not present

## 2017-02-19 DIAGNOSIS — N186 End stage renal disease: Secondary | ICD-10-CM | POA: Diagnosis not present

## 2017-02-19 DIAGNOSIS — N401 Enlarged prostate with lower urinary tract symptoms: Secondary | ICD-10-CM | POA: Insufficient documentation

## 2017-02-19 DIAGNOSIS — D631 Anemia in chronic kidney disease: Secondary | ICD-10-CM | POA: Diagnosis not present

## 2017-02-19 DIAGNOSIS — N2581 Secondary hyperparathyroidism of renal origin: Secondary | ICD-10-CM | POA: Diagnosis not present

## 2017-02-19 LAB — PROTIME-INR
INR: 3.07
Prothrombin Time: 32.3 seconds — ABNORMAL HIGH (ref 11.4–15.2)

## 2017-02-19 NOTE — Assessment & Plan Note (Addendum)
71 year old pleasant male with history of right distal femur fracture back in January, complicated by right femoral DVT now on Coumadin. DVT detected 2 months ago. Patient has end-stage renal disease on hemodialysis, chronic anemia on Epogen and iron. His hemoglobin has been stable since January. He did require blood transfusions postoperatively. His MCV has been climbing and is now 105 however. From a GI standpoint he denies any symptoms. Last month he was heme positive 2. No overt GI bleeding reported. No prior EGD or colonoscopy.  Would typically offer colonoscopy plus or minus EGD for further evaluation of heme positive stool plus or minus IDA however given fairly recent DVT we may need to consider holding off until he safely can come off his anticoagulation. To discuss further with Dr. Gala Romney. Could potentially use Lovenox bridge. Further recommendations to follow.  Patient has a legal guardian, [name]. Phone number [phone] extension [phone].  Would recommend checking folate and B12 levels given present MCV if not done already.

## 2017-02-20 ENCOUNTER — Encounter (HOSPITAL_COMMUNITY)
Admission: AD | Admit: 2017-02-20 | Discharge: 2017-02-20 | Disposition: A | Discharge status: Home / Self Care | Payer: Medicare Other | Source: Skilled Nursing Facility

## 2017-02-20 DIAGNOSIS — Z7901 Long term (current) use of anticoagulants: Secondary | ICD-10-CM | POA: Diagnosis not present

## 2017-02-20 DIAGNOSIS — S72421D Displaced fracture of lateral condyle of right femur, subsequent encounter for closed fracture with routine healing: Secondary | ICD-10-CM | POA: Diagnosis not present

## 2017-02-20 DIAGNOSIS — I82401 Acute embolism and thrombosis of unspecified deep veins of right lower extremity: Secondary | ICD-10-CM | POA: Diagnosis not present

## 2017-02-20 DIAGNOSIS — N401 Enlarged prostate with lower urinary tract symptoms: Secondary | ICD-10-CM | POA: Diagnosis not present

## 2017-02-20 LAB — PROTIME-INR
INR: 1.37
Prothrombin Time: 17 seconds — ABNORMAL HIGH (ref 11.4–15.2)

## 2017-02-20 NOTE — Progress Notes (Signed)
CC'D TO PCP °

## 2017-02-21 DIAGNOSIS — Z992 Dependence on renal dialysis: Secondary | ICD-10-CM | POA: Diagnosis not present

## 2017-02-21 DIAGNOSIS — N186 End stage renal disease: Secondary | ICD-10-CM | POA: Diagnosis not present

## 2017-02-21 DIAGNOSIS — D631 Anemia in chronic kidney disease: Secondary | ICD-10-CM | POA: Diagnosis not present

## 2017-02-21 DIAGNOSIS — N2581 Secondary hyperparathyroidism of renal origin: Secondary | ICD-10-CM | POA: Diagnosis not present

## 2017-02-21 DIAGNOSIS — E162 Hypoglycemia, unspecified: Secondary | ICD-10-CM | POA: Diagnosis not present

## 2017-02-21 DIAGNOSIS — D509 Iron deficiency anemia, unspecified: Secondary | ICD-10-CM | POA: Diagnosis not present

## 2017-02-23 ENCOUNTER — Encounter (HOSPITAL_COMMUNITY)
Admission: RE | Admit: 2017-02-23 | Discharge: 2017-02-23 | Disposition: A | Discharge status: Home / Self Care | Payer: Medicare Other | Source: Skilled Nursing Facility | Attending: *Deleted | Admitting: *Deleted

## 2017-02-23 DIAGNOSIS — N186 End stage renal disease: Secondary | ICD-10-CM | POA: Diagnosis not present

## 2017-02-23 DIAGNOSIS — N401 Enlarged prostate with lower urinary tract symptoms: Secondary | ICD-10-CM | POA: Diagnosis not present

## 2017-02-23 DIAGNOSIS — Z7901 Long term (current) use of anticoagulants: Secondary | ICD-10-CM | POA: Diagnosis not present

## 2017-02-23 DIAGNOSIS — I82401 Acute embolism and thrombosis of unspecified deep veins of right lower extremity: Secondary | ICD-10-CM | POA: Diagnosis not present

## 2017-02-23 DIAGNOSIS — S72421D Displaced fracture of lateral condyle of right femur, subsequent encounter for closed fracture with routine healing: Secondary | ICD-10-CM | POA: Diagnosis not present

## 2017-02-23 DIAGNOSIS — Z992 Dependence on renal dialysis: Secondary | ICD-10-CM | POA: Diagnosis not present

## 2017-02-23 LAB — PROTIME-INR
INR: 1.69
Prothrombin Time: 20.1 seconds — ABNORMAL HIGH (ref 11.4–15.2)

## 2017-02-24 DIAGNOSIS — D509 Iron deficiency anemia, unspecified: Secondary | ICD-10-CM | POA: Diagnosis not present

## 2017-02-24 DIAGNOSIS — Z992 Dependence on renal dialysis: Secondary | ICD-10-CM | POA: Diagnosis not present

## 2017-02-24 DIAGNOSIS — D631 Anemia in chronic kidney disease: Secondary | ICD-10-CM | POA: Diagnosis not present

## 2017-02-24 DIAGNOSIS — N186 End stage renal disease: Secondary | ICD-10-CM | POA: Diagnosis not present

## 2017-02-26 ENCOUNTER — Encounter (HOSPITAL_COMMUNITY)
Admission: RE | Admit: 2017-02-26 | Discharge: 2017-02-26 | Disposition: A | Discharge status: Home / Self Care | Payer: Medicare Other | Source: Skilled Nursing Facility | Attending: Internal Medicine | Admitting: Internal Medicine

## 2017-02-26 DIAGNOSIS — R791 Abnormal coagulation profile: Secondary | ICD-10-CM | POA: Insufficient documentation

## 2017-02-26 DIAGNOSIS — Z992 Dependence on renal dialysis: Secondary | ICD-10-CM | POA: Diagnosis not present

## 2017-02-26 DIAGNOSIS — N186 End stage renal disease: Secondary | ICD-10-CM | POA: Diagnosis not present

## 2017-02-26 DIAGNOSIS — D631 Anemia in chronic kidney disease: Secondary | ICD-10-CM | POA: Diagnosis not present

## 2017-02-26 DIAGNOSIS — D509 Iron deficiency anemia, unspecified: Secondary | ICD-10-CM | POA: Diagnosis not present

## 2017-02-26 LAB — PROTIME-INR
INR: 1.79
Prothrombin Time: 21 seconds — ABNORMAL HIGH (ref 11.4–15.2)

## 2017-02-28 DIAGNOSIS — N186 End stage renal disease: Secondary | ICD-10-CM | POA: Diagnosis not present

## 2017-02-28 DIAGNOSIS — D509 Iron deficiency anemia, unspecified: Secondary | ICD-10-CM | POA: Diagnosis not present

## 2017-02-28 DIAGNOSIS — D631 Anemia in chronic kidney disease: Secondary | ICD-10-CM | POA: Diagnosis not present

## 2017-02-28 DIAGNOSIS — Z992 Dependence on renal dialysis: Secondary | ICD-10-CM | POA: Diagnosis not present

## 2017-03-02 ENCOUNTER — Encounter: Payer: Self-pay | Admitting: Internal Medicine

## 2017-03-02 ENCOUNTER — Non-Acute Institutional Stay (SKILLED_NURSING_FACILITY): Payer: Medicare Other | Admitting: Internal Medicine

## 2017-03-02 DIAGNOSIS — N186 End stage renal disease: Secondary | ICD-10-CM | POA: Diagnosis not present

## 2017-03-02 DIAGNOSIS — I82401 Acute embolism and thrombosis of unspecified deep veins of right lower extremity: Secondary | ICD-10-CM | POA: Diagnosis not present

## 2017-03-02 DIAGNOSIS — R338 Other retention of urine: Secondary | ICD-10-CM

## 2017-03-02 DIAGNOSIS — E1121 Type 2 diabetes mellitus with diabetic nephropathy: Secondary | ICD-10-CM

## 2017-03-02 DIAGNOSIS — D638 Anemia in other chronic diseases classified elsewhere: Secondary | ICD-10-CM | POA: Diagnosis not present

## 2017-03-02 DIAGNOSIS — N401 Enlarged prostate with lower urinary tract symptoms: Secondary | ICD-10-CM

## 2017-03-02 DIAGNOSIS — S72491A Other fracture of lower end of right femur, initial encounter for closed fracture: Secondary | ICD-10-CM | POA: Diagnosis not present

## 2017-03-02 NOTE — Progress Notes (Signed)
Location:   [facility] Room Number: 112/D Place of Service:  SNF (31) Provider:  Glennon Hamilton, MD  Patient Care Team: [name], MD as PCP - General (Internal Medicine) Cassandria Anger, MD as Consulting Physician (Endocrinology) Fran Lowes, MD as Consulting Physician (Nephrology) Gala Romney Cristopher Estimable, MD as Consulting Physician (Gastroenterology)  Extended Emergency Contact Information Primary Emergency Contact: Radene Gunning Address: DSS  [place] Phone: [phone] Work Phone: [phone] Relation: Legal Guardian Secondary Emergency Contact: Garno,Richard Address: [address]          [place], [place] [address] [place] Phone: [phone] Relation: Brother  Code Status:  Full Code Goals of care: Advanced Directive information Advanced Directives 03/02/2017  Does Patient Have a Medical Advance Directive? Yes  Type of Advance Directive (No Data)  Does patient want to make changes to medical advance directive? No - Patient declined  Copy of [place] in Chart? -  Would patient like information on creating a medical advance directive? No - Patient declined  Pre-existing out of facility DNR order (yellow form or pink MOST form) -     Chief Complaint  Patient presents with  . Medical Management of Chronic Issues    Routine Visit  Medical management of chronic medical issues including history of right hip fracture with repair-right leg DVT-end-stage renal disease on chronic hemodialysis-diabetes type 2-hypothyroidism-edema-hyperlipidemia-GERD-BPH-  HPI:  Pt is a 71 y.o. male seen today for medical management of chronic diseases. As noted above.  He continues to be stable and doing well with supportive care appears to have gained strength during his stay here and is doing well with supportive care.  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  Most acute issue has been recent occult positive stool-he has been evaluated by GI in late April-at this point not receiving more aggressive workup with a colonoscopy or EGD secondary to concerns of his recent right leg DVT and is on chronic Coumadin currently-he does have follow-up with GI coming up shortly for reevaluation of this they were hesitant to initially worked this up because of his anticoagulation status with recent DVT.  His hemoglobin nonetheless appears to be stable at 9.6 will update this last lab was done April 9.  He does receive Epogen at dialysis  He is tolerating dialysis well he has dialysis 3 days a week.  In regards to his DVT we are continuing again Coumadin anticoagulation INR was slightly subtherapeutic at 1.79 on lab done last week Coumadin was slightly increased he's on 4.5 mg on Saturday and Sunday 5 mg all other days updated INR is pending.  Regards to diabetes type 2 this appears quite well controlled he is on Tradjenta blood sugars appear to run largely in the lower mid 100s the lowest one that I see is 86 the highest I see in the morning is 163.  Regards hypothyroidism TSH is therapeutic at 3.004 he is on Synthroid this lab was done in March.  In regards hyperlipidemia this appears well controlled on Lipitor LDL was 50 on the March lab as well.  He does have a history of chronic lower extremity edema which appears to be stable I do note he did have a cardiac echo back in May 2016 which showed grade 1 diastolic dysfunction ejection fraction was 50-55 percent.       Past Medical History:  Diagnosis Date  .  Abnormal CT scan, kidney 10/06/2011  . Acute pyelonephritis 10/07/2011  . Anemia    normocytic  . Anxiety    mental retardation  . Bladder wall thickening 10/06/2011  . BPH (benign prostatic hypertrophy)   . Diabetes mellitus   . Edema     history of lower extremity edema  . Heme positive stool   . Hydronephrosis     . Hyperkalemia   . Hyperlipidemia   . Hypernatremia   . Hypertension   . Hypothyroidism   . Impaired speech   . 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 MR (mental retardation)   . Obstructive uropathy   . 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 Poor historian poor historian  . Protein calorie malnutrition ([facility])   . Pyelonephritis   . Renal failure (ARF), acute on chronic (HCC)   . Renal insufficiency    chronic history  . Smoking   . Uremia   . Urinary retention   . UTI (lower urinary tract infection) 10/06/2011  . UTI (lower urinary tract infection)    Past Surgical History:  Procedure Laterality Date  . AV FISTULA PLACEMENT Left 07/06/2015   Procedure:  INSERTION LEFT ARM ARTERIOVENOUS GORTEX GRAFT;  Surgeon: Angelia Mould, MD;  Location: [facility];  Service: Vascular;  Laterality: Left;  . AV FISTULA PLACEMENT Right 02/26/2016   Procedure: ARTERIOVENOUS (AV) FISTULA CREATION ;  Surgeon: Angelia Mould, MD;  Location: [facility];  Service: Vascular;  Laterality: Right;  . [place] REMOVAL Left 10/09/2015   Procedure: REMOVAL OF ARTERIOVENOUS GORETEX GRAFT ([facility]) Evacuation of Lymphocele, Vein Patch angioplasty of brachial artery.;  Surgeon: Angelia Mould, MD;  Location: [facility];  Service: Vascular;  Laterality: Left;  . BASCILIC VEIN TRANSPOSITION Right 02/26/2016   Procedure: Right BASCILIC VEIN TRANSPOSITION;  Surgeon: Angelia Mould, MD;  Location: [facility];  Service: Vascular;  Laterality: Right;  . CIRCUMCISION N/A 01/04/2014   Procedure: CIRCUMCISION ADULT (procedure #1);  Surgeon: Marissa Nestle, MD;  Location: [facility];  Service: Urology;  Laterality: N/A;  . CYSTOSCOPY W/ RETROGRADES Bilateral 06/29/2015   Procedure: CYSTOSCOPY, DILATION OF URETHRAL STRICTURE WITH BILATERAL RETROGRADE PYELOGRAM,SUPRAPUBIC TUBE CHANGE;  Surgeon: Festus Aloe, MD;  Location: [facility];  Service: Urology;  Laterality: Bilateral;  . CYSTOSCOPY WITH URETHRAL DILATATION N/A 12/29/2013    Procedure: CYSTOSCOPY WITH URETHRAL DILATATION;  Surgeon: Marissa Nestle, MD;  Location: [facility];  Service: Urology;  Laterality: N/A;  . ORIF FEMUR FRACTURE Right 11/22/2016   Procedure: OPEN REDUCTION INTERNAL FIXATION (ORIF) DISTAL FEMUR FRACTURE;  Surgeon: Rod Can, MD;  Location: [facility];  Service: Orthopedics;  Laterality: Right;  . PERIPHERAL VASCULAR CATHETERIZATION N/A 10/08/2015   Procedure: A/V Shuntogram;  Surgeon: Angelia Mould, MD;  Location: [facility];  Service: Cardiovascular;  Laterality: N/A;  . TRANSURETHRAL RESECTION OF PROSTATE N/A 01/04/2014   Procedure: TRANSURETHRAL RESECTION OF THE PROSTATE (TURP) (procedure #2);  Surgeon: Marissa Nestle, MD;  Location: [facility];  Service: Urology;  Laterality: N/A;    No Known Allergies  Outpatient Encounter Prescriptions as of 03/02/2017  Medication Sig  . Amino Acids-Protein Hydrolys (FEEDING SUPPLEMENT, PRO-STAT 64,) LIQD Take 30 mLs by mouth 2 (two) times daily between meals.  Marland Kitchen atorvastatin (LIPITOR) 40 MG tablet Take 40 mg by mouth every evening.  . carvedilol (COREG) 6.25 MG tablet Take 1 tablet (6.25 mg total) by mouth 2 (two) times daily with a meal.  . epoetin alfa (EPOGEN,PROCRIT) 4000 UNIT/ML injection Inject  4,000 Units into the vein every 14 (fourteen) days.  Marland Kitchen levothyroxine (SYNTHROID, LEVOTHROID) 75 MCG tablet Take 75 mcg by mouth daily before breakfast.   . linagliptin (TRADJENTA) 5 MG TABS tablet Take 2.5 mg by mouth every evening.   . Multiple Vitamin (MULTIVITAMIN WITH MINERALS) TABS tablet Take 1 tablet by mouth every evening.   . Neo-Bacit-Poly-Lidocaine (FIRST AID PLUS LIDOCAINE EX) Apply 1 application topically 3 (three) times a week. Prior to dialysis  . Nutritional Supplements (NEPRO) LIQD Take 1 can by mouth twice a day  . omeprazole (PRILOSEC) 20 MG capsule Take 40 mg by mouth every evening.   . tamsulosin (FLOMAX) 0.4 MG CAPS capsule Take 0.4 mg by mouth daily.  Marland Kitchen torsemide (DEMADEX)  10 MG tablet Take 5 tablets (50 mg total) by mouth daily.  Marland Kitchen warfarin (COUMADIN) 1 MG tablet Take 0.5 mg tablet by mouth along with 4 mg to equal 4.5 mg once a day  . warfarin (COUMADIN) 4 MG tablet Take 4 mg tablet along with 0.5 to equal 4.5 mg once a day  . [DISCONTINUED] ondansetron (ZOFRAN) 4 MG tablet Take 4 mg by mouth every 8 (eight) hours as needed for nausea or vomiting.   No facility-administered encounter medications on file as of 03/02/2017.     Review of Systems   This is somewhat limited since patient is a poor historian provided by nursing as well.  In general no complaints of fever or chills.  Skin does not complaining of rashes or itching.  Head ears eyes nose mouth and throat does not complain of any visual changes sore throat.  Respiratory no complaints of shortness breath or cough.  Cardiac no complaints of chest pain has some mild lower extremity edema on the right.  GI is not complaining of any abdominal pain nausea vomiting diarrhea constipation   GU does not complaining of dysuria he does have a history of end-stage renal disease on chronic hemodialysis.  Musculoskeletal is not really complaining of joint pain or right leg pain at this time.  Neurologic is not complaining dizziness or headache syncope or numbness.  Psych does have a history of mild mental retardation but is doing quite well with supportive care   Immunization History  Administered Date(s) Administered  . Influenza,inj,Quad PF,36+ Mos 12/26/2013, 07/09/2015  . PPD Test 03/07/2015  . Pneumococcal Polysaccharide-23 03/04/2015   Pertinent  Health Maintenance Due  Topic Date Due  . FOOT EXAM  08/23/1956  . OPHTHALMOLOGY EXAM  08/23/1956  . URINE MICROALBUMIN  08/23/1958  . COLONOSCOPY  08/23/1998  . INFLUENZA VACCINE  09/26/2017 (Originally 05/27/2017)  . PNA vac Low Risk Adult (2 of 2 - PCV13) 09/26/2017 (Originally 03/03/2016)  . HEMOGLOBIN A1C  08/18/2017   No flowsheet data  found. Functional Status Survey:    Vitals:   03/02/17 1410  BP: 100/63  Pulse: 78  Resp: 18  Temp: 97.6 F (36.4 C)  TempSrc: Oral   Weight is 189.8 pounds  Physical Exam  In general this is a very pleasant middle-age male in no distress sitting comfortably in bed.  His skin is warm and dry.  Oropharynx clear mucous membranes moist.  Eyes visual acuity appears grossly intact.  Chest is clear to auscultation with somewhat shallow air entry there is no labored breathing.  Heart is regular rate and rhythm without murmur gallop or rub he does have a bruit appreciated right upper arm.  He has slight right lower extremity edema.  Abdomen soft nontender positive  bowel sounds.  Musculoskeletal does have an immobilizer in place right lower extremity with mild edema pedal pulse is intact-he does not really have significant edema of his left leg.  Has baseline upper extremity strength.  Neurologic is grossly intact to speech is clear no lateralizing findings.  Psych he is pleasant and appropriate with mild mental deficits does well with supportive care    Labs reviewed:  Recent Labs  11/24/16 0339 11/25/16 0428 11/26/16 0613  12/26/16 0922 12/28/16 0647 01/04/17 1500  NA 136 136 136  < > 136 137 143  K 5.6* 4.8 4.5  < > 3.7 3.8 3.4*  CL 100* 97* 97*  < > 97* 100* 106  CO2 23 25 24   < > 30 27 27   GLUCOSE 91 112* 88  < > 118* 93 197*  BUN 55* 41* 64*  < > 38* 32* 33*  CREATININE 7.68* 6.47* 8.80*  < > 4.77* 4.25* 3.64*  CALCIUM 8.3* 8.7* 8.3*  < > 8.7* 8.7* 8.7*  PHOS 6.9* 6.7* 8.7*  --   --   --   --   < > = values in this interval not displayed.  Recent Labs  04/03/16 0935  11/20/16 1446  11/25/16 0428 11/26/16 0613 11/28/16 1400  AST 13*  --  27  --   --   --  27  ALT 12*  --  33  --   --   --  <5*  ALKPHOS 66  --  77  --   --   --  68  BILITOT 0.4  --  0.4  --   --   --  0.9  PROT 6.0*  --  7.1  --   --   --  7.6  ALBUMIN 3.0*  < > 3.7  < > 2.6* 2.3*  2.9*  < > = values in this interval not displayed.  Recent Labs  01/08/17 0700  01/14/17 0730 01/19/17 0700 02/02/17 0700  WBC 5.6  < > 5.4 6.0 5.5  NEUTROABS 3.1  --   --  3.0 2.9  HGB 8.8*  < > 9.4* 9.2* 9.6*  HCT 27.1*  < > 28.6* 28.2* 30.4*  MCV 100.4*  < > 100.4* 103.3* 105.6*  PLT 294  < > 267 253 192  < > = values in this interval not displayed. Lab Results  Component Value Date   TSH 3.004 01/16/2017   Lab Results  Component Value Date   HGBA1C 4.8 02/16/2017   Lab Results  Component Value Date   CHOL 98 01/16/2017   HDL 33 (L) 01/16/2017   LDLCALC 50 01/16/2017   TRIG 77 01/16/2017   CHOLHDL 3.0 01/16/2017    Significant Diagnostic Results in last 30 days:  No results found.  Assessment/Plan  1 history of right leg DVT-continues on Coumadin as noted above INR slightly subtherapeutic [place] 7 made again goal is to try to keep INR relatively close to 2 with his history of occult positive bleeding.  #2 history of occult positive bleeding with anemia-his hemoglobin actually appears stable at 9.6 he is followed by GI and apparently has a GI follow-up scheduled here shortly-back in late April on the 25th he was seen by GI at that point they deferred aggressive workup secondary to stability of his hemoglobin as well as being on anticoagulation with a history of recent DVT but they will be readdressing this-will update a hemoglobin to keep benign on this.  #3 history of end-stage  renal disease he is on dialysis and appears to be doing relatively well with this.  #4 history of right hip fracture he is followed by orthopedics continues with an immobilizer .  #5 history of diabetes type 2 on Tradjenta as noted above blood sugars appear to be satisfactory largely in the lower 100s hemoglobin A1c was 4.8 on lab done 02/16/2017.  #6 anemia-continues on Epogen at dialysis again hemoglobin has been stable complicated with his history of occult positive stool he is being  followed by GI as well as noted above.--I do note lab work has been somewhat macrocytic will order a B12 and folate as well  #7 history hyperlipidemia continues on Lipitor LDL was 50 on lab done in March 2018.  #8 history of BPH he is on Flomax and followed by urology apparently they are taking about doing further procedures once he is more stable in regards to his DVT and hip fracture.  #9 history of GERD this is been relatively asymptomatic on Prilosec.  #10 history hypothyroidism continues on Synthroid TSH was normal at 3.004 on March 2018 lab.  #11 history of edema I do note he did have a cardiac echo done back in 2016 which showed grade 1 diastolic CHF this appears fairly stable on Demadex will update a metabolic panel.  #12 occasional hypotensive readings he is been asymptomatic and this appears to be more stable he does continue on Coreg low dose-recent blood pressures are 100/63-128/80 pulse is 78 and 91 at times he has been tachycardic but Coreg appears to be effective here.  Again will update a CBC and BMP for updated values.  [id number] note greater than 40 minutes spent assessing patient-reviewing his chart-reviewing his labs-discussing his status with nursing staff-and coordinating formulating a plan of care for numerous diagnoses-of note greater than 50% of time spent coordinating plan of care

## 2017-03-03 ENCOUNTER — Encounter (HOSPITAL_COMMUNITY)
Admission: RE | Admit: 2017-03-03 | Discharge: 2017-03-03 | Disposition: A | Discharge status: Home / Self Care | Payer: Medicare Other | Source: Skilled Nursing Facility | Attending: Internal Medicine | Admitting: Internal Medicine

## 2017-03-03 DIAGNOSIS — S72421D Displaced fracture of lateral condyle of right femur, subsequent encounter for closed fracture with routine healing: Secondary | ICD-10-CM | POA: Insufficient documentation

## 2017-03-03 DIAGNOSIS — Z992 Dependence on renal dialysis: Secondary | ICD-10-CM | POA: Diagnosis not present

## 2017-03-03 DIAGNOSIS — D509 Iron deficiency anemia, unspecified: Secondary | ICD-10-CM | POA: Insufficient documentation

## 2017-03-03 DIAGNOSIS — X58XXXD Exposure to other specified factors, subsequent encounter: Secondary | ICD-10-CM | POA: Diagnosis not present

## 2017-03-03 DIAGNOSIS — N401 Enlarged prostate with lower urinary tract symptoms: Secondary | ICD-10-CM | POA: Insufficient documentation

## 2017-03-03 DIAGNOSIS — B351 Tinea unguium: Secondary | ICD-10-CM | POA: Diagnosis not present

## 2017-03-03 DIAGNOSIS — D631 Anemia in chronic kidney disease: Secondary | ICD-10-CM | POA: Diagnosis not present

## 2017-03-03 DIAGNOSIS — E1159 Type 2 diabetes mellitus with other circulatory complications: Secondary | ICD-10-CM | POA: Diagnosis not present

## 2017-03-03 DIAGNOSIS — N186 End stage renal disease: Secondary | ICD-10-CM | POA: Diagnosis not present

## 2017-03-03 DIAGNOSIS — M6281 Muscle weakness (generalized): Secondary | ICD-10-CM | POA: Diagnosis not present

## 2017-03-03 DIAGNOSIS — I82401 Acute embolism and thrombosis of unspecified deep veins of right lower extremity: Secondary | ICD-10-CM | POA: Insufficient documentation

## 2017-03-03 LAB — BASIC METABOLIC PANEL
Anion gap: 14 (ref 5–15)
BUN: 114 mg/dL — ABNORMAL HIGH (ref 6–20)
CO2: 24 mmol/L (ref 22–32)
Calcium: 8.8 mg/dL — ABNORMAL LOW (ref 8.9–10.3)
Chloride: 99 mmol/L — ABNORMAL LOW (ref 101–111)
Creatinine, Ser: 6.15 mg/dL — ABNORMAL HIGH (ref 0.61–1.24)
GFR calc Af Amer: 10 mL/min — ABNORMAL LOW (ref >60)
GFR calc non Af Amer: 8 mL/min — ABNORMAL LOW (ref >60)
Glucose, Bld: 91 mg/dL (ref 65–99)
Potassium: 4.6 mmol/L (ref 3.5–5.1)
Sodium: 137 mmol/L (ref 135–145)

## 2017-03-03 LAB — CBC WITH DIFFERENTIAL/PLATELET
Basophils Absolute: 0 10*3/uL (ref 0.0–0.1)
Basophils Relative: 0 %
Eosinophils Absolute: 0.2 10*3/uL (ref 0.0–0.7)
Eosinophils Relative: 3 %
HCT: 34.2 % — ABNORMAL LOW (ref 39.0–52.0)
Hemoglobin: 11.2 g/dL — ABNORMAL LOW (ref 13.0–17.0)
Lymphocytes Relative: 34 %
Lymphs Abs: 1.8 10*3/uL (ref 0.7–4.0)
MCH: 33.9 pg (ref 26.0–34.0)
MCHC: 32.7 g/dL (ref 30.0–36.0)
MCV: 103.6 fL — ABNORMAL HIGH (ref 78.0–100.0)
Monocytes Absolute: 0.7 10*3/uL (ref 0.1–1.0)
Monocytes Relative: 12 %
Neutro Abs: 2.7 10*3/uL (ref 1.7–7.7)
Neutrophils Relative %: 51 %
Platelets: 187 10*3/uL (ref 150–400)
RBC: 3.3 MIL/uL — ABNORMAL LOW (ref 4.22–5.81)
RDW: 15.8 % — ABNORMAL HIGH (ref 11.5–15.5)
WBC: 5.3 10*3/uL (ref 4.0–10.5)

## 2017-03-03 LAB — FOLATE: Folate: 21.7 ng/mL (ref >5.9)

## 2017-03-03 LAB — VITAMIN B12: Vitamin B-12: 624 pg/mL (ref 180–914)

## 2017-03-04 ENCOUNTER — Encounter (HOSPITAL_COMMUNITY)
Admission: AD | Admit: 2017-03-04 | Discharge: 2017-03-04 | Disposition: A | Discharge status: Home / Self Care | Payer: Medicare Other | Source: Skilled Nursing Facility | Attending: *Deleted | Admitting: *Deleted

## 2017-03-04 DIAGNOSIS — S72491D Other fracture of lower end of right femur, subsequent encounter for closed fracture with routine healing: Secondary | ICD-10-CM | POA: Diagnosis not present

## 2017-03-04 DIAGNOSIS — R791 Abnormal coagulation profile: Secondary | ICD-10-CM | POA: Diagnosis not present

## 2017-03-04 LAB — BASIC METABOLIC PANEL
Anion gap: 13 (ref 5–15)
BUN: 65 mg/dL — ABNORMAL HIGH (ref 6–20)
CO2: 26 mmol/L (ref 22–32)
Calcium: 9.2 mg/dL (ref 8.9–10.3)
Chloride: 99 mmol/L — ABNORMAL LOW (ref 101–111)
Creatinine, Ser: 4.52 mg/dL — ABNORMAL HIGH (ref 0.61–1.24)
GFR calc Af Amer: 14 mL/min — ABNORMAL LOW (ref >60)
GFR calc non Af Amer: 12 mL/min — ABNORMAL LOW (ref >60)
Glucose, Bld: 105 mg/dL — ABNORMAL HIGH (ref 65–99)
Potassium: 4.1 mmol/L (ref 3.5–5.1)
Sodium: 138 mmol/L (ref 135–145)

## 2017-03-05 ENCOUNTER — Encounter (HOSPITAL_COMMUNITY)
Admission: RE | Admit: 2017-03-05 | Discharge: 2017-03-05 | Disposition: A | Discharge status: Home / Self Care | Payer: Medicare Other | Source: Skilled Nursing Facility | Attending: Internal Medicine | Admitting: Internal Medicine

## 2017-03-05 DIAGNOSIS — D631 Anemia in chronic kidney disease: Secondary | ICD-10-CM | POA: Diagnosis not present

## 2017-03-05 DIAGNOSIS — N186 End stage renal disease: Secondary | ICD-10-CM | POA: Diagnosis not present

## 2017-03-05 DIAGNOSIS — R791 Abnormal coagulation profile: Secondary | ICD-10-CM | POA: Insufficient documentation

## 2017-03-05 DIAGNOSIS — D509 Iron deficiency anemia, unspecified: Secondary | ICD-10-CM | POA: Diagnosis not present

## 2017-03-05 DIAGNOSIS — Z992 Dependence on renal dialysis: Secondary | ICD-10-CM | POA: Diagnosis not present

## 2017-03-05 LAB — PROTIME-INR
INR: 2.23
Prothrombin Time: 25.1 seconds — ABNORMAL HIGH (ref 11.4–15.2)

## 2017-03-07 DIAGNOSIS — D509 Iron deficiency anemia, unspecified: Secondary | ICD-10-CM | POA: Diagnosis not present

## 2017-03-07 DIAGNOSIS — Z992 Dependence on renal dialysis: Secondary | ICD-10-CM | POA: Diagnosis not present

## 2017-03-07 DIAGNOSIS — N186 End stage renal disease: Secondary | ICD-10-CM | POA: Diagnosis not present

## 2017-03-07 DIAGNOSIS — D631 Anemia in chronic kidney disease: Secondary | ICD-10-CM | POA: Diagnosis not present

## 2017-03-10 DIAGNOSIS — D509 Iron deficiency anemia, unspecified: Secondary | ICD-10-CM | POA: Diagnosis not present

## 2017-03-10 DIAGNOSIS — D631 Anemia in chronic kidney disease: Secondary | ICD-10-CM | POA: Diagnosis not present

## 2017-03-10 DIAGNOSIS — Z992 Dependence on renal dialysis: Secondary | ICD-10-CM | POA: Diagnosis not present

## 2017-03-10 DIAGNOSIS — N186 End stage renal disease: Secondary | ICD-10-CM | POA: Diagnosis not present

## 2017-03-11 ENCOUNTER — Ambulatory Visit (INDEPENDENT_AMBULATORY_CARE_PROVIDER_SITE_OTHER): Payer: Medicare Other | Admitting: Ophthalmology

## 2017-03-12 ENCOUNTER — Encounter (HOSPITAL_COMMUNITY)
Admission: RE | Admit: 2017-03-12 | Discharge: 2017-03-12 | Disposition: A | Discharge status: Home / Self Care | Payer: Medicare Other | Source: Skilled Nursing Facility | Attending: Internal Medicine | Admitting: Internal Medicine

## 2017-03-12 DIAGNOSIS — R6889 Other general symptoms and signs: Secondary | ICD-10-CM | POA: Insufficient documentation

## 2017-03-12 DIAGNOSIS — N186 End stage renal disease: Secondary | ICD-10-CM | POA: Diagnosis not present

## 2017-03-12 DIAGNOSIS — D509 Iron deficiency anemia, unspecified: Secondary | ICD-10-CM | POA: Diagnosis not present

## 2017-03-12 DIAGNOSIS — Z992 Dependence on renal dialysis: Secondary | ICD-10-CM | POA: Diagnosis not present

## 2017-03-12 DIAGNOSIS — R7989 Other specified abnormal findings of blood chemistry: Secondary | ICD-10-CM | POA: Insufficient documentation

## 2017-03-12 DIAGNOSIS — Z86718 Personal history of other venous thrombosis and embolism: Secondary | ICD-10-CM | POA: Insufficient documentation

## 2017-03-12 DIAGNOSIS — D631 Anemia in chronic kidney disease: Secondary | ICD-10-CM | POA: Diagnosis not present

## 2017-03-12 LAB — PROTIME-INR
INR: 4.06
Prothrombin Time: 40.2 seconds — ABNORMAL HIGH (ref 11.4–15.2)

## 2017-03-13 ENCOUNTER — Encounter (HOSPITAL_COMMUNITY)
Admission: RE | Admit: 2017-03-13 | Discharge: 2017-03-13 | Disposition: A | Discharge status: Home / Self Care | Payer: Medicare Other | Source: Skilled Nursing Facility | Attending: Internal Medicine | Admitting: Internal Medicine

## 2017-03-13 DIAGNOSIS — X58XXXD Exposure to other specified factors, subsequent encounter: Secondary | ICD-10-CM | POA: Diagnosis not present

## 2017-03-13 DIAGNOSIS — N401 Enlarged prostate with lower urinary tract symptoms: Secondary | ICD-10-CM | POA: Insufficient documentation

## 2017-03-13 DIAGNOSIS — Z9181 History of falling: Secondary | ICD-10-CM | POA: Diagnosis not present

## 2017-03-13 DIAGNOSIS — I82401 Acute embolism and thrombosis of unspecified deep veins of right lower extremity: Secondary | ICD-10-CM | POA: Diagnosis not present

## 2017-03-13 DIAGNOSIS — S72421D Displaced fracture of lateral condyle of right femur, subsequent encounter for closed fracture with routine healing: Secondary | ICD-10-CM | POA: Insufficient documentation

## 2017-03-13 DIAGNOSIS — M6281 Muscle weakness (generalized): Secondary | ICD-10-CM | POA: Diagnosis not present

## 2017-03-13 DIAGNOSIS — R279 Unspecified lack of coordination: Secondary | ICD-10-CM | POA: Diagnosis not present

## 2017-03-13 LAB — PROTIME-INR
INR: 3.66
Prothrombin Time: 37.3 seconds — ABNORMAL HIGH (ref 11.4–15.2)

## 2017-03-14 ENCOUNTER — Other Ambulatory Visit (HOSPITAL_COMMUNITY)
Admission: RE | Admit: 2017-03-14 | Discharge: 2017-03-14 | Disposition: A | Discharge status: Home / Self Care | Payer: Medicare Other | Source: Skilled Nursing Facility | Attending: Internal Medicine | Admitting: Internal Medicine

## 2017-03-14 DIAGNOSIS — Z7901 Long term (current) use of anticoagulants: Secondary | ICD-10-CM | POA: Diagnosis not present

## 2017-03-14 DIAGNOSIS — D509 Iron deficiency anemia, unspecified: Secondary | ICD-10-CM | POA: Diagnosis not present

## 2017-03-14 DIAGNOSIS — Z992 Dependence on renal dialysis: Secondary | ICD-10-CM | POA: Diagnosis not present

## 2017-03-14 DIAGNOSIS — N186 End stage renal disease: Secondary | ICD-10-CM | POA: Diagnosis not present

## 2017-03-14 DIAGNOSIS — D631 Anemia in chronic kidney disease: Secondary | ICD-10-CM | POA: Diagnosis not present

## 2017-03-14 LAB — PROTIME-INR
INR: 2.39
Prothrombin Time: 26.5 seconds — ABNORMAL HIGH (ref 11.4–15.2)

## 2017-03-15 ENCOUNTER — Other Ambulatory Visit (HOSPITAL_COMMUNITY)
Admission: RE | Admit: 2017-03-15 | Discharge: 2017-03-15 | Disposition: A | Discharge status: Home / Self Care | Payer: Medicare Other | Source: Skilled Nursing Facility | Attending: Internal Medicine | Admitting: Internal Medicine

## 2017-03-15 DIAGNOSIS — M6281 Muscle weakness (generalized): Secondary | ICD-10-CM | POA: Diagnosis not present

## 2017-03-15 DIAGNOSIS — R279 Unspecified lack of coordination: Secondary | ICD-10-CM | POA: Diagnosis not present

## 2017-03-15 DIAGNOSIS — I82401 Acute embolism and thrombosis of unspecified deep veins of right lower extremity: Secondary | ICD-10-CM | POA: Diagnosis not present

## 2017-03-15 DIAGNOSIS — Z9181 History of falling: Secondary | ICD-10-CM | POA: Diagnosis not present

## 2017-03-15 LAB — PROTIME-INR
INR: 2.28
Prothrombin Time: 25.5 seconds — ABNORMAL HIGH (ref 11.4–15.2)

## 2017-03-17 DIAGNOSIS — Z9181 History of falling: Secondary | ICD-10-CM | POA: Diagnosis not present

## 2017-03-17 DIAGNOSIS — D509 Iron deficiency anemia, unspecified: Secondary | ICD-10-CM | POA: Diagnosis not present

## 2017-03-17 DIAGNOSIS — R279 Unspecified lack of coordination: Secondary | ICD-10-CM | POA: Diagnosis not present

## 2017-03-17 DIAGNOSIS — M6281 Muscle weakness (generalized): Secondary | ICD-10-CM | POA: Diagnosis not present

## 2017-03-17 DIAGNOSIS — N186 End stage renal disease: Secondary | ICD-10-CM | POA: Diagnosis not present

## 2017-03-17 DIAGNOSIS — Z992 Dependence on renal dialysis: Secondary | ICD-10-CM | POA: Diagnosis not present

## 2017-03-17 DIAGNOSIS — D631 Anemia in chronic kidney disease: Secondary | ICD-10-CM | POA: Diagnosis not present

## 2017-03-18 ENCOUNTER — Encounter (HOSPITAL_COMMUNITY)
Admission: RE | Admit: 2017-03-18 | Discharge: 2017-03-18 | Disposition: A | Discharge status: Home / Self Care | Payer: Medicare Other | Source: Skilled Nursing Facility | Attending: Internal Medicine | Admitting: Internal Medicine

## 2017-03-18 DIAGNOSIS — I82401 Acute embolism and thrombosis of unspecified deep veins of right lower extremity: Secondary | ICD-10-CM | POA: Diagnosis not present

## 2017-03-18 DIAGNOSIS — S72421D Displaced fracture of lateral condyle of right femur, subsequent encounter for closed fracture with routine healing: Secondary | ICD-10-CM | POA: Insufficient documentation

## 2017-03-18 DIAGNOSIS — R279 Unspecified lack of coordination: Secondary | ICD-10-CM | POA: Diagnosis not present

## 2017-03-18 DIAGNOSIS — X58XXXD Exposure to other specified factors, subsequent encounter: Secondary | ICD-10-CM | POA: Diagnosis not present

## 2017-03-18 DIAGNOSIS — N401 Enlarged prostate with lower urinary tract symptoms: Secondary | ICD-10-CM | POA: Diagnosis not present

## 2017-03-18 DIAGNOSIS — M6281 Muscle weakness (generalized): Secondary | ICD-10-CM | POA: Diagnosis not present

## 2017-03-18 DIAGNOSIS — Z9181 History of falling: Secondary | ICD-10-CM | POA: Diagnosis not present

## 2017-03-18 LAB — PROTIME-INR
INR: 2.49
Prothrombin Time: 27.4 seconds — ABNORMAL HIGH (ref 11.4–15.2)

## 2017-03-19 DIAGNOSIS — D631 Anemia in chronic kidney disease: Secondary | ICD-10-CM | POA: Diagnosis not present

## 2017-03-19 DIAGNOSIS — Z9181 History of falling: Secondary | ICD-10-CM | POA: Diagnosis not present

## 2017-03-19 DIAGNOSIS — Z992 Dependence on renal dialysis: Secondary | ICD-10-CM | POA: Diagnosis not present

## 2017-03-19 DIAGNOSIS — N186 End stage renal disease: Secondary | ICD-10-CM | POA: Diagnosis not present

## 2017-03-19 DIAGNOSIS — M6281 Muscle weakness (generalized): Secondary | ICD-10-CM | POA: Diagnosis not present

## 2017-03-19 DIAGNOSIS — D509 Iron deficiency anemia, unspecified: Secondary | ICD-10-CM | POA: Diagnosis not present

## 2017-03-19 DIAGNOSIS — R279 Unspecified lack of coordination: Secondary | ICD-10-CM | POA: Diagnosis not present

## 2017-03-20 ENCOUNTER — Encounter (HOSPITAL_COMMUNITY)
Admission: RE | Admit: 2017-03-20 | Discharge: 2017-03-20 | Disposition: A | Discharge status: Home / Self Care | Payer: Medicare Other | Source: Skilled Nursing Facility | Attending: Internal Medicine | Admitting: Internal Medicine

## 2017-03-20 DIAGNOSIS — R7989 Other specified abnormal findings of blood chemistry: Secondary | ICD-10-CM | POA: Insufficient documentation

## 2017-03-20 DIAGNOSIS — Z7901 Long term (current) use of anticoagulants: Secondary | ICD-10-CM | POA: Diagnosis not present

## 2017-03-20 DIAGNOSIS — R6889 Other general symptoms and signs: Secondary | ICD-10-CM | POA: Diagnosis not present

## 2017-03-20 LAB — PROTIME-INR
INR: 2.04
Prothrombin Time: 23.3 seconds — ABNORMAL HIGH (ref 11.4–15.2)

## 2017-03-21 DIAGNOSIS — D509 Iron deficiency anemia, unspecified: Secondary | ICD-10-CM | POA: Diagnosis not present

## 2017-03-21 DIAGNOSIS — D631 Anemia in chronic kidney disease: Secondary | ICD-10-CM | POA: Diagnosis not present

## 2017-03-21 DIAGNOSIS — M6281 Muscle weakness (generalized): Secondary | ICD-10-CM | POA: Diagnosis not present

## 2017-03-21 DIAGNOSIS — Z992 Dependence on renal dialysis: Secondary | ICD-10-CM | POA: Diagnosis not present

## 2017-03-21 DIAGNOSIS — R279 Unspecified lack of coordination: Secondary | ICD-10-CM | POA: Diagnosis not present

## 2017-03-21 DIAGNOSIS — Z9181 History of falling: Secondary | ICD-10-CM | POA: Diagnosis not present

## 2017-03-21 DIAGNOSIS — N186 End stage renal disease: Secondary | ICD-10-CM | POA: Diagnosis not present

## 2017-03-23 ENCOUNTER — Encounter (HOSPITAL_COMMUNITY)
Admission: RE | Admit: 2017-03-23 | Discharge: 2017-03-23 | Disposition: A | Discharge status: Home / Self Care | Payer: Medicare Other | Source: Skilled Nursing Facility | Attending: *Deleted | Admitting: *Deleted

## 2017-03-23 DIAGNOSIS — R279 Unspecified lack of coordination: Secondary | ICD-10-CM | POA: Diagnosis not present

## 2017-03-23 DIAGNOSIS — Z9181 History of falling: Secondary | ICD-10-CM | POA: Diagnosis not present

## 2017-03-23 DIAGNOSIS — R791 Abnormal coagulation profile: Secondary | ICD-10-CM | POA: Diagnosis not present

## 2017-03-23 DIAGNOSIS — M6281 Muscle weakness (generalized): Secondary | ICD-10-CM | POA: Diagnosis not present

## 2017-03-23 LAB — PROTIME-INR
INR: 2.41
Prothrombin Time: 26.7 seconds — ABNORMAL HIGH (ref 11.4–15.2)

## 2017-03-24 DIAGNOSIS — Z992 Dependence on renal dialysis: Secondary | ICD-10-CM | POA: Diagnosis not present

## 2017-03-24 DIAGNOSIS — R279 Unspecified lack of coordination: Secondary | ICD-10-CM | POA: Diagnosis not present

## 2017-03-24 DIAGNOSIS — N186 End stage renal disease: Secondary | ICD-10-CM | POA: Diagnosis not present

## 2017-03-24 DIAGNOSIS — D509 Iron deficiency anemia, unspecified: Secondary | ICD-10-CM | POA: Diagnosis not present

## 2017-03-24 DIAGNOSIS — D631 Anemia in chronic kidney disease: Secondary | ICD-10-CM | POA: Diagnosis not present

## 2017-03-24 DIAGNOSIS — M6281 Muscle weakness (generalized): Secondary | ICD-10-CM | POA: Diagnosis not present

## 2017-03-24 DIAGNOSIS — Z9181 History of falling: Secondary | ICD-10-CM | POA: Diagnosis not present

## 2017-03-25 DIAGNOSIS — M6281 Muscle weakness (generalized): Secondary | ICD-10-CM | POA: Diagnosis not present

## 2017-03-25 DIAGNOSIS — Z9181 History of falling: Secondary | ICD-10-CM | POA: Diagnosis not present

## 2017-03-25 DIAGNOSIS — R279 Unspecified lack of coordination: Secondary | ICD-10-CM | POA: Diagnosis not present

## 2017-03-26 ENCOUNTER — Encounter (HOSPITAL_COMMUNITY)
Admission: RE | Admit: 2017-03-26 | Discharge: 2017-03-26 | Disposition: A | Discharge status: Home / Self Care | Payer: Medicare Other | Source: Skilled Nursing Facility | Attending: Pediatrics | Admitting: Pediatrics

## 2017-03-26 DIAGNOSIS — N186 End stage renal disease: Secondary | ICD-10-CM | POA: Diagnosis not present

## 2017-03-26 DIAGNOSIS — M6281 Muscle weakness (generalized): Secondary | ICD-10-CM | POA: Diagnosis not present

## 2017-03-26 DIAGNOSIS — Z9181 History of falling: Secondary | ICD-10-CM | POA: Diagnosis not present

## 2017-03-26 DIAGNOSIS — D631 Anemia in chronic kidney disease: Secondary | ICD-10-CM | POA: Diagnosis not present

## 2017-03-26 DIAGNOSIS — D509 Iron deficiency anemia, unspecified: Secondary | ICD-10-CM | POA: Diagnosis not present

## 2017-03-26 DIAGNOSIS — Z992 Dependence on renal dialysis: Secondary | ICD-10-CM | POA: Diagnosis not present

## 2017-03-26 DIAGNOSIS — R279 Unspecified lack of coordination: Secondary | ICD-10-CM | POA: Diagnosis not present

## 2017-03-27 DIAGNOSIS — M6281 Muscle weakness (generalized): Secondary | ICD-10-CM | POA: Diagnosis not present

## 2017-03-27 DIAGNOSIS — Z9181 History of falling: Secondary | ICD-10-CM | POA: Diagnosis not present

## 2017-03-27 DIAGNOSIS — R279 Unspecified lack of coordination: Secondary | ICD-10-CM | POA: Diagnosis not present

## 2017-03-28 DIAGNOSIS — T82898A Other specified complication of vascular prosthetic devices, implants and grafts, initial encounter: Secondary | ICD-10-CM | POA: Diagnosis not present

## 2017-03-28 DIAGNOSIS — Z992 Dependence on renal dialysis: Secondary | ICD-10-CM | POA: Diagnosis not present

## 2017-03-28 DIAGNOSIS — D509 Iron deficiency anemia, unspecified: Secondary | ICD-10-CM | POA: Diagnosis not present

## 2017-03-28 DIAGNOSIS — N186 End stage renal disease: Secondary | ICD-10-CM | POA: Diagnosis not present

## 2017-03-28 DIAGNOSIS — Z23 Encounter for immunization: Secondary | ICD-10-CM | POA: Diagnosis not present

## 2017-03-28 DIAGNOSIS — D631 Anemia in chronic kidney disease: Secondary | ICD-10-CM | POA: Diagnosis not present

## 2017-03-30 ENCOUNTER — Encounter (HOSPITAL_COMMUNITY)
Admission: RE | Admit: 2017-03-30 | Discharge: 2017-03-30 | Disposition: A | Discharge status: Home / Self Care | Payer: Medicare Other | Source: Skilled Nursing Facility | Attending: Internal Medicine | Admitting: Internal Medicine

## 2017-03-30 DIAGNOSIS — M6281 Muscle weakness (generalized): Secondary | ICD-10-CM | POA: Diagnosis not present

## 2017-03-30 DIAGNOSIS — I82401 Acute embolism and thrombosis of unspecified deep veins of right lower extremity: Secondary | ICD-10-CM | POA: Diagnosis not present

## 2017-03-30 DIAGNOSIS — R279 Unspecified lack of coordination: Secondary | ICD-10-CM | POA: Diagnosis not present

## 2017-03-30 DIAGNOSIS — N401 Enlarged prostate with lower urinary tract symptoms: Secondary | ICD-10-CM | POA: Insufficient documentation

## 2017-03-30 DIAGNOSIS — S72421D Displaced fracture of lateral condyle of right femur, subsequent encounter for closed fracture with routine healing: Secondary | ICD-10-CM | POA: Insufficient documentation

## 2017-03-30 DIAGNOSIS — Z7901 Long term (current) use of anticoagulants: Secondary | ICD-10-CM | POA: Insufficient documentation

## 2017-03-30 DIAGNOSIS — Z9181 History of falling: Secondary | ICD-10-CM | POA: Diagnosis not present

## 2017-03-30 LAB — PROTIME-INR
INR: 2
Prothrombin Time: 23 seconds — ABNORMAL HIGH (ref 11.4–15.2)

## 2017-03-31 DIAGNOSIS — T82898A Other specified complication of vascular prosthetic devices, implants and grafts, initial encounter: Secondary | ICD-10-CM | POA: Diagnosis not present

## 2017-03-31 DIAGNOSIS — M6281 Muscle weakness (generalized): Secondary | ICD-10-CM | POA: Diagnosis not present

## 2017-03-31 DIAGNOSIS — N186 End stage renal disease: Secondary | ICD-10-CM | POA: Diagnosis not present

## 2017-03-31 DIAGNOSIS — Z23 Encounter for immunization: Secondary | ICD-10-CM | POA: Diagnosis not present

## 2017-03-31 DIAGNOSIS — Z9181 History of falling: Secondary | ICD-10-CM | POA: Diagnosis not present

## 2017-03-31 DIAGNOSIS — Z992 Dependence on renal dialysis: Secondary | ICD-10-CM | POA: Diagnosis not present

## 2017-03-31 DIAGNOSIS — D631 Anemia in chronic kidney disease: Secondary | ICD-10-CM | POA: Diagnosis not present

## 2017-03-31 DIAGNOSIS — R279 Unspecified lack of coordination: Secondary | ICD-10-CM | POA: Diagnosis not present

## 2017-03-31 DIAGNOSIS — D509 Iron deficiency anemia, unspecified: Secondary | ICD-10-CM | POA: Diagnosis not present

## 2017-04-01 DIAGNOSIS — M6281 Muscle weakness (generalized): Secondary | ICD-10-CM | POA: Diagnosis not present

## 2017-04-01 DIAGNOSIS — R279 Unspecified lack of coordination: Secondary | ICD-10-CM | POA: Diagnosis not present

## 2017-04-01 DIAGNOSIS — Z9181 History of falling: Secondary | ICD-10-CM | POA: Diagnosis not present

## 2017-04-02 ENCOUNTER — Encounter (HOSPITAL_COMMUNITY)
Admission: RE | Admit: 2017-04-02 | Discharge: 2017-04-02 | Disposition: A | Discharge status: Home / Self Care | Payer: Medicare Other | Source: Skilled Nursing Facility | Attending: Internal Medicine | Admitting: Internal Medicine

## 2017-04-02 DIAGNOSIS — X58XXXD Exposure to other specified factors, subsequent encounter: Secondary | ICD-10-CM | POA: Insufficient documentation

## 2017-04-02 DIAGNOSIS — D509 Iron deficiency anemia, unspecified: Secondary | ICD-10-CM | POA: Diagnosis not present

## 2017-04-02 DIAGNOSIS — R279 Unspecified lack of coordination: Secondary | ICD-10-CM | POA: Diagnosis not present

## 2017-04-02 DIAGNOSIS — I82401 Acute embolism and thrombosis of unspecified deep veins of right lower extremity: Secondary | ICD-10-CM | POA: Diagnosis not present

## 2017-04-02 DIAGNOSIS — M6281 Muscle weakness (generalized): Secondary | ICD-10-CM | POA: Diagnosis not present

## 2017-04-02 DIAGNOSIS — N186 End stage renal disease: Secondary | ICD-10-CM | POA: Diagnosis not present

## 2017-04-02 DIAGNOSIS — N401 Enlarged prostate with lower urinary tract symptoms: Secondary | ICD-10-CM | POA: Insufficient documentation

## 2017-04-02 DIAGNOSIS — Z9181 History of falling: Secondary | ICD-10-CM | POA: Diagnosis not present

## 2017-04-02 DIAGNOSIS — S72421D Displaced fracture of lateral condyle of right femur, subsequent encounter for closed fracture with routine healing: Secondary | ICD-10-CM | POA: Insufficient documentation

## 2017-04-02 DIAGNOSIS — T82898A Other specified complication of vascular prosthetic devices, implants and grafts, initial encounter: Secondary | ICD-10-CM | POA: Diagnosis not present

## 2017-04-02 DIAGNOSIS — Z992 Dependence on renal dialysis: Secondary | ICD-10-CM | POA: Diagnosis not present

## 2017-04-02 DIAGNOSIS — Z23 Encounter for immunization: Secondary | ICD-10-CM | POA: Diagnosis not present

## 2017-04-02 DIAGNOSIS — D631 Anemia in chronic kidney disease: Secondary | ICD-10-CM | POA: Diagnosis not present

## 2017-04-02 LAB — PROTIME-INR
INR: 1.55
Prothrombin Time: 18.7 seconds — ABNORMAL HIGH (ref 11.4–15.2)

## 2017-04-03 DIAGNOSIS — Z9181 History of falling: Secondary | ICD-10-CM | POA: Diagnosis not present

## 2017-04-03 DIAGNOSIS — R279 Unspecified lack of coordination: Secondary | ICD-10-CM | POA: Diagnosis not present

## 2017-04-03 DIAGNOSIS — M6281 Muscle weakness (generalized): Secondary | ICD-10-CM | POA: Diagnosis not present

## 2017-04-04 DIAGNOSIS — Z992 Dependence on renal dialysis: Secondary | ICD-10-CM | POA: Diagnosis not present

## 2017-04-04 DIAGNOSIS — T82898A Other specified complication of vascular prosthetic devices, implants and grafts, initial encounter: Secondary | ICD-10-CM | POA: Diagnosis not present

## 2017-04-04 DIAGNOSIS — D631 Anemia in chronic kidney disease: Secondary | ICD-10-CM | POA: Diagnosis not present

## 2017-04-04 DIAGNOSIS — Z23 Encounter for immunization: Secondary | ICD-10-CM | POA: Diagnosis not present

## 2017-04-04 DIAGNOSIS — D509 Iron deficiency anemia, unspecified: Secondary | ICD-10-CM | POA: Diagnosis not present

## 2017-04-04 DIAGNOSIS — N186 End stage renal disease: Secondary | ICD-10-CM | POA: Diagnosis not present

## 2017-04-06 ENCOUNTER — Non-Acute Institutional Stay (SKILLED_NURSING_FACILITY): Payer: Medicare Other | Admitting: Internal Medicine

## 2017-04-06 ENCOUNTER — Encounter: Payer: Self-pay | Admitting: Internal Medicine

## 2017-04-06 DIAGNOSIS — S72001S Fracture of unspecified part of neck of right femur, sequela: Secondary | ICD-10-CM

## 2017-04-06 DIAGNOSIS — E782 Mixed hyperlipidemia: Secondary | ICD-10-CM | POA: Diagnosis not present

## 2017-04-06 DIAGNOSIS — E1121 Type 2 diabetes mellitus with diabetic nephropathy: Secondary | ICD-10-CM

## 2017-04-06 DIAGNOSIS — I82401 Acute embolism and thrombosis of unspecified deep veins of right lower extremity: Secondary | ICD-10-CM | POA: Diagnosis not present

## 2017-04-06 DIAGNOSIS — R195 Other fecal abnormalities: Secondary | ICD-10-CM | POA: Diagnosis not present

## 2017-04-06 DIAGNOSIS — N186 End stage renal disease: Secondary | ICD-10-CM | POA: Diagnosis not present

## 2017-04-06 DIAGNOSIS — I10 Essential (primary) hypertension: Secondary | ICD-10-CM

## 2017-04-06 NOTE — Progress Notes (Signed)
Location:   [facility] Room Number: 112/D Place of Service:  SNF (31) Provider:  Geralyn Flash, MD  Patient Care Team: [name], MD as PCP - General (Internal Medicine) Cassandria Anger, MD as Consulting Physician (Endocrinology) Fran Lowes, MD as Consulting Physician (Nephrology) Gala Romney Cristopher Estimable, MD as Consulting Physician (Gastroenterology)  Extended Emergency Contact Information Primary Emergency Contact: Radene Gunning Address: DSS  [place] Phone: [phone] Work Phone: [phone] Relation: Legal Guardian Secondary Emergency Contact: Samara,Richard Address: [address]          [place], [place] [address] [place] Phone: [phone] Relation: Brother  Code Status:  Full Code Goals of care: Advanced Directive information Advanced Directives 04/06/2017  Does Patient Have a Medical Advance Directive? Yes  Type of Advance Directive (No Data)  Does patient want to make changes to medical advance directive? No - Patient declined  Copy of [place] in Chart? -  Would patient like information on creating a medical advance directive? No - Patient declined  Pre-existing out of facility DNR order (yellow form or pink MOST form) -     Chief Complaint  Patient presents with  . Medical Management of Chronic Issues    Routine Visit    HPI:  Pt is a 71 y.o. male seen today for medical management of chronic diseases.    Patient was Admitted to SNF after  Right Distal Fracture requiring ORIF. Patient also has h/o ESRD on dialysis, Type 2 Diabetes, Hypothyroidism, anemia and Mild Mental delay. Since being in facility patient was diagnosed with Right Acute DVT and is on chronic coumadin now. He also Had Anemia with occult positive Stool. Patient was seen by GI and they decided to wait before doing any procedure on him as he was recently started on Coumadin for  DVT and they did not want to hold coumadin. His Hgb has been stable since then. He was seen by Ortho on on 05/09 and was placed on Unhinged Brace with unlimited ROM. Patient weight is stable at 189.8 lbs. He did not have any new complains today. Past Medical History:  Diagnosis Date  . Abnormal CT scan, kidney 10/06/2011  . Acute pyelonephritis 10/07/2011  . Anemia    normocytic  . Anxiety    mental retardation  . Bladder wall thickening 10/06/2011  . BPH (benign prostatic hypertrophy)   . Diabetes mellitus   . Edema     history of lower extremity edema  . Heme positive stool   . Hydronephrosis   . Hyperkalemia   . Hyperlipidemia   . Hypernatremia   . Hypertension   . Hypothyroidism   . Impaired speech   . 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 MR (mental retardation)   . Obstructive uropathy   . 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 Poor historian poor historian  . Protein calorie malnutrition ([place])   . Pyelonephritis   . Renal failure (ARF), acute on chronic (HCC)   . Renal insufficiency    chronic history  . Smoking   . Uremia   . Urinary retention   . UTI (lower urinary tract infection) 10/06/2011  . UTI (lower urinary tract infection)    Past Surgical History:  Procedure Laterality Date  . AV FISTULA PLACEMENT Left 07/06/2015   Procedure:  INSERTION LEFT ARM ARTERIOVENOUS GORTEX GRAFT;  Surgeon: Angelia Mould, MD;  Location: [facility];  Service: Vascular;  Laterality: Left;  .  AV FISTULA PLACEMENT Right 02/26/2016   Procedure: ARTERIOVENOUS (AV) FISTULA CREATION ;  Surgeon: Angelia Mould, MD;  Location: [facility];  Service: Vascular;  Laterality: Right;  . [place] REMOVAL Left 10/09/2015   Procedure: REMOVAL OF ARTERIOVENOUS GORETEX GRAFT ([place]) Evacuation of Lymphocele, Vein Patch angioplasty of brachial artery.;  Surgeon: Angelia Mould, MD;  Location: [facility];  Service: Vascular;  Laterality: Left;  . BASCILIC VEIN TRANSPOSITION Right 02/26/2016    Procedure: Right BASCILIC VEIN TRANSPOSITION;  Surgeon: Angelia Mould, MD;  Location: [facility];  Service: Vascular;  Laterality: Right;  . CIRCUMCISION N/A 01/04/2014   Procedure: CIRCUMCISION ADULT (procedure #1);  Surgeon: Marissa Nestle, MD;  Location: [facility];  Service: Urology;  Laterality: N/A;  . CYSTOSCOPY W/ RETROGRADES Bilateral 06/29/2015   Procedure: CYSTOSCOPY, DILATION OF URETHRAL STRICTURE WITH BILATERAL RETROGRADE PYELOGRAM,SUPRAPUBIC TUBE CHANGE;  Surgeon: Festus Aloe, MD;  Location: [facility];  Service: Urology;  Laterality: Bilateral;  . CYSTOSCOPY WITH URETHRAL DILATATION N/A 12/29/2013   Procedure: CYSTOSCOPY WITH URETHRAL DILATATION;  Surgeon: Marissa Nestle, MD;  [facility];  Service: Urology;  Laterality: N/A;  . ORIF FEMUR FRACTURE Right 11/22/2016   Procedure: OPEN REDUCTION INTERNAL FIXATION (ORIF) DISTAL FEMUR FRACTURE;  Surgeon: Rod Can, MD;  Location: [facility];  Service: Orthopedics;  Laterality: Right;  . PERIPHERAL VASCULAR CATHETERIZATION N/A 10/08/2015   Procedure: A/V Shuntogram;  Surgeon: Angelia Mould, MD;  Location: [facility];  Service: Cardiovascular;  Laterality: N/A;  . TRANSURETHRAL RESECTION OF PROSTATE N/A 01/04/2014   Procedure: TRANSURETHRAL RESECTION OF THE PROSTATE (TURP) (procedure #2);  Surgeon: Marissa Nestle, MD;  [facility];  Service: Urology;  Laterality: N/A;    No Known Allergies  Outpatient Encounter Prescriptions as of 04/06/2017  Medication Sig  . Amino Acids-Protein Hydrolys (FEEDING SUPPLEMENT, PRO-STAT 64,) LIQD Take 30 mLs by mouth 2 (two) times daily between meals.  Marland Kitchen atorvastatin (LIPITOR) 40 MG tablet Take 40 mg by mouth every evening.  . carvedilol (COREG) 6.25 MG tablet Take 1 tablet (6.25 mg total) by mouth 2 (two) times daily with a meal.  . epoetin alfa (EPOGEN,PROCRIT) 4000 UNIT/ML injection Inject 4,000 Units into the vein every 14 (fourteen) days.  Marland Kitchen levothyroxine (SYNTHROID,  LEVOTHROID) 75 MCG tablet Take 75 mcg by mouth daily before breakfast.   . linagliptin (TRADJENTA) 5 MG TABS tablet Take 2.5 mg by mouth every evening.   . Multiple Vitamin (MULTIVITAMIN WITH MINERALS) TABS tablet Take 1 tablet by mouth every evening.   . Neo-Bacit-Poly-Lidocaine (FIRST AID PLUS LIDOCAINE EX) Apply 1 application topically 3 (three) times a week. Prior to dialysis  . Nutritional Supplements (NEPRO) LIQD Take 1 can by mouth twice a day  . omeprazole (PRILOSEC) 20 MG capsule Take 40 mg by mouth every evening.   . tamsulosin (FLOMAX) 0.4 MG CAPS capsule Take 0.4 mg by mouth daily.  Marland Kitchen torsemide (DEMADEX) 10 MG tablet Take 5 tablets (50 mg total) by mouth daily.  Marland Kitchen warfarin (COUMADIN) 4 MG tablet Take 4 mg by mouth daily.   . [DISCONTINUED] warfarin (COUMADIN) 1 MG tablet Take 0.5 mg tablet by mouth along with 4 mg to equal 4.5 mg once a day   No facility-administered encounter medications on file as of 04/06/2017.     Review of Systems Review of Systems  Constitutional: Negative for activity change, appetite change, chills, diaphoresis, fatigue and fever.  HENT: Negative for mouth sores, postnasal drip, rhinorrhea, sinus pain and sore  throat.   Respiratory: Negative for apnea, cough, chest tightness, shortness of breath and wheezing.   Cardiovascular: Negative for chest pain, palpitations and leg swelling.  Gastrointestinal: Negative for abdominal distention, abdominal pain, constipation, diarrhea, nausea and vomiting.  Genitourinary: Negative for dysuria and frequency.  Musculoskeletal: Negative for arthralgias, joint swelling and myalgias.  Skin: Negative for rash.  Neurological: Negative for dizziness, syncope, weakness, light-headedness and numbness.  Psychiatric/Behavioral: Negative for behavioral problems, confusion and sleep disturbance.    Immunization History  Administered Date(s) Administered  . Influenza,inj,Quad PF,36+ Mos 12/26/2013, 07/09/2015  . PPD Test  03/07/2015  . Pneumococcal Polysaccharide-23 03/04/2015   Pertinent  Health Maintenance Due  Topic Date Due  . FOOT EXAM  08/[birth year]/1958  . OPHTHALMOLOGY EXAM  08/23/1958  . URINE MICROALBUMIN  08/23/1958  . COLONOSCOPY  08/23/1998  . INFLUENZA VACCINE  09/26/2017 (Originally 05/27/2017)  . PNA vac Low Risk Adult (2 of 2 - PCV13) 09/26/2017 (Originally 03/03/2016)  . HEMOGLOBIN A1C  08/18/2017   No flowsheet data found. Functional Status Survey:    Vitals:   04/05/17 1549  BP: (!) 71/41 Repeat Manually 120/70  Pulse: (!) 48  Resp: 20  Temp: 98.1 F (36.7 C)  TempSrc: Oral   There is no height or weight on file to calculate BMI. Physical Exam  Constitutional: He is oriented to person, place, and time. He appears well-developed and well-nourished.  HENT:  Head: Normocephalic.  Mouth/Throat: Oropharynx is clear and moist.  Eyes: Pupils are equal, round, and reactive to light.  Neck: Neck supple.  Cardiovascular: Normal rate, regular rhythm and normal heart sounds.   Pulmonary/Chest: Effort normal and breath sounds normal. No respiratory distress. He has no wheezes. He has no rales.  Abdominal: Soft. Bowel sounds are normal. He exhibits no distension. There is no tenderness. There is no rebound.  Musculoskeletal: He exhibits no edema.  Neurological: He is alert and oriented to person, place, and time.  Skin: Skin is warm and dry.  Psychiatric: He has a normal mood and affect. His behavior is normal. Thought content normal.    Labs reviewed:  Recent Labs  11/24/16 0339 11/25/16 0428 11/26/16 0613  01/04/17 1500 03/03/17 0802 03/04/17 0700  NA 136 136 136  < > 143 137 138  K 5.6* 4.8 4.5  < > 3.4* 4.6 4.1  CL 100* 97* 97*  < > 106 99* 99*  CO2 23 25 24   < > 27 24 26   GLUCOSE 91 112* 88  < > 197* 91 105*  BUN 55* 41* 64*  < > 33* 114* 65*  CREATININE 7.68* 6.47* 8.80*  < > 3.64* 6.15* 4.52*  CALCIUM 8.3* 8.7* 8.3*  < > 8.7* 8.8* 9.2  PHOS 6.9* 6.7* 8.7*  --   --   --    --   < > = values in this interval not displayed.  Recent Labs  11/20/16 1446  11/25/16 0428 11/26/16 0613 11/28/16 1400  AST 27  --   --   --  27  ALT 33  --   --   --  <5*  ALKPHOS 77  --   --   --  68  BILITOT 0.4  --   --   --  0.9  PROT 7.1  --   --   --  7.6  ALBUMIN 3.7  < > 2.6* 2.3* 2.9*  < > = values in this interval not displayed.  Recent Labs  01/19/17 0700 02/02/17 0700 03/03/17  0802  WBC 6.0 5.5 5.3  NEUTROABS 3.0 2.9 2.7  HGB 9.2* 9.6* 11.2*  HCT 28.2* 30.4* 34.2*  MCV 103.3* 105.6* 103.6*  PLT 253 192 187   Lab Results  Component Value Date   TSH 3.004 01/16/2017   Lab Results  Component Value Date   HGBA1C 4.8 02/16/2017   Lab Results  Component Value Date   CHOL 98 01/16/2017   HDL 33 (L) 01/16/2017   LDLCALC 50 01/16/2017   TRIG 77 01/16/2017   CHOLHDL 3.0 01/16/2017    Significant Diagnostic Results in last 30 days:  No results found.  Assessment/Plan  End stage renal disease  Patient on Chronic Dialysis. Follows with Nephrology  Type 2 diabetes mellitus  His BS are controlled and his A1C was 4.8 in 04/18 Will discontinue Tradjenta. Continue Accu check.   DVT  of right lower extremity On Coumadin Continur to Monitor INR.  Essential hypertension His BP has been low many times in the past. His EF in 2016 was normal. Will discontinue Coreg. Continue to monitor his BP  Heme positive stool Hgb Stable. Continue to follow with GI. On Protonix  Mixed hyperlipidemia LDL was 50 in 03/18 Continue Atorvastatin.  Closed fracture of neck of right femur S/P ORIF Follow with Ortho Getting therapy and doing well. Says he was able to walk some with Gilford Rile. Hypothyroid Continue same dose of Synthroid as TSH normal in 03/18  Family/ staff Communication:   Labs/tests ordered:    Total time spent in this patient care encounter was 25_ minutes; greater than 50% of the visit spent counseling patient and coordinating care for problems  addressed at this encounter.

## 2017-04-07 DIAGNOSIS — D509 Iron deficiency anemia, unspecified: Secondary | ICD-10-CM | POA: Diagnosis not present

## 2017-04-07 DIAGNOSIS — R279 Unspecified lack of coordination: Secondary | ICD-10-CM | POA: Diagnosis not present

## 2017-04-07 DIAGNOSIS — Z992 Dependence on renal dialysis: Secondary | ICD-10-CM | POA: Diagnosis not present

## 2017-04-07 DIAGNOSIS — T82898A Other specified complication of vascular prosthetic devices, implants and grafts, initial encounter: Secondary | ICD-10-CM | POA: Diagnosis not present

## 2017-04-07 DIAGNOSIS — Z23 Encounter for immunization: Secondary | ICD-10-CM | POA: Diagnosis not present

## 2017-04-07 DIAGNOSIS — D631 Anemia in chronic kidney disease: Secondary | ICD-10-CM | POA: Diagnosis not present

## 2017-04-07 DIAGNOSIS — N186 End stage renal disease: Secondary | ICD-10-CM | POA: Diagnosis not present

## 2017-04-07 DIAGNOSIS — M6281 Muscle weakness (generalized): Secondary | ICD-10-CM | POA: Diagnosis not present

## 2017-04-07 DIAGNOSIS — Z9181 History of falling: Secondary | ICD-10-CM | POA: Diagnosis not present

## 2017-04-08 DIAGNOSIS — Z9181 History of falling: Secondary | ICD-10-CM | POA: Diagnosis not present

## 2017-04-08 DIAGNOSIS — R279 Unspecified lack of coordination: Secondary | ICD-10-CM | POA: Diagnosis not present

## 2017-04-08 DIAGNOSIS — M6281 Muscle weakness (generalized): Secondary | ICD-10-CM | POA: Diagnosis not present

## 2017-04-09 ENCOUNTER — Encounter (HOSPITAL_COMMUNITY)
Admission: RE | Admit: 2017-04-09 | Discharge: 2017-04-09 | Disposition: A | Discharge status: Home / Self Care | Payer: Medicare Other | Source: Skilled Nursing Facility | Attending: Internal Medicine | Admitting: Internal Medicine

## 2017-04-09 DIAGNOSIS — S72421D Displaced fracture of lateral condyle of right femur, subsequent encounter for closed fracture with routine healing: Secondary | ICD-10-CM | POA: Diagnosis not present

## 2017-04-09 DIAGNOSIS — I82401 Acute embolism and thrombosis of unspecified deep veins of right lower extremity: Secondary | ICD-10-CM | POA: Diagnosis not present

## 2017-04-09 DIAGNOSIS — D631 Anemia in chronic kidney disease: Secondary | ICD-10-CM | POA: Diagnosis not present

## 2017-04-09 DIAGNOSIS — Z992 Dependence on renal dialysis: Secondary | ICD-10-CM | POA: Diagnosis not present

## 2017-04-09 DIAGNOSIS — N401 Enlarged prostate with lower urinary tract symptoms: Secondary | ICD-10-CM | POA: Insufficient documentation

## 2017-04-09 DIAGNOSIS — Z9181 History of falling: Secondary | ICD-10-CM | POA: Diagnosis not present

## 2017-04-09 DIAGNOSIS — M6281 Muscle weakness (generalized): Secondary | ICD-10-CM | POA: Diagnosis not present

## 2017-04-09 DIAGNOSIS — R279 Unspecified lack of coordination: Secondary | ICD-10-CM | POA: Diagnosis not present

## 2017-04-09 DIAGNOSIS — X58XXXD Exposure to other specified factors, subsequent encounter: Secondary | ICD-10-CM | POA: Insufficient documentation

## 2017-04-09 DIAGNOSIS — N186 End stage renal disease: Secondary | ICD-10-CM | POA: Diagnosis not present

## 2017-04-09 DIAGNOSIS — Z23 Encounter for immunization: Secondary | ICD-10-CM | POA: Diagnosis not present

## 2017-04-09 DIAGNOSIS — D509 Iron deficiency anemia, unspecified: Secondary | ICD-10-CM | POA: Diagnosis not present

## 2017-04-09 DIAGNOSIS — T82898A Other specified complication of vascular prosthetic devices, implants and grafts, initial encounter: Secondary | ICD-10-CM | POA: Diagnosis not present

## 2017-04-09 LAB — PROTIME-INR
INR: 2.12
Prothrombin Time: 24 seconds — ABNORMAL HIGH (ref 11.4–15.2)

## 2017-04-10 DIAGNOSIS — Z9181 History of falling: Secondary | ICD-10-CM | POA: Diagnosis not present

## 2017-04-10 DIAGNOSIS — M6281 Muscle weakness (generalized): Secondary | ICD-10-CM | POA: Diagnosis not present

## 2017-04-10 DIAGNOSIS — R279 Unspecified lack of coordination: Secondary | ICD-10-CM | POA: Diagnosis not present

## 2017-04-11 DIAGNOSIS — T82898A Other specified complication of vascular prosthetic devices, implants and grafts, initial encounter: Secondary | ICD-10-CM | POA: Diagnosis not present

## 2017-04-11 DIAGNOSIS — Z23 Encounter for immunization: Secondary | ICD-10-CM | POA: Diagnosis not present

## 2017-04-11 DIAGNOSIS — N186 End stage renal disease: Secondary | ICD-10-CM | POA: Diagnosis not present

## 2017-04-11 DIAGNOSIS — M6281 Muscle weakness (generalized): Secondary | ICD-10-CM | POA: Diagnosis not present

## 2017-04-11 DIAGNOSIS — D509 Iron deficiency anemia, unspecified: Secondary | ICD-10-CM | POA: Diagnosis not present

## 2017-04-11 DIAGNOSIS — D631 Anemia in chronic kidney disease: Secondary | ICD-10-CM | POA: Diagnosis not present

## 2017-04-11 DIAGNOSIS — Z9181 History of falling: Secondary | ICD-10-CM | POA: Diagnosis not present

## 2017-04-11 DIAGNOSIS — R279 Unspecified lack of coordination: Secondary | ICD-10-CM | POA: Diagnosis not present

## 2017-04-11 DIAGNOSIS — Z992 Dependence on renal dialysis: Secondary | ICD-10-CM | POA: Diagnosis not present

## 2017-04-12 ENCOUNTER — Telehealth: Payer: Self-pay | Admitting: Gastroenterology

## 2017-04-12 NOTE — Telephone Encounter (Signed)
Patient needs an urgent OV to schedule TCS+/-EGD with Lovenox bridge for anemia, heme+ stool.   Please go ahead and reserve a date for TCS+/-EGD, patient has dialysis so coordinate with that schedule.

## 2017-04-13 DIAGNOSIS — R279 Unspecified lack of coordination: Secondary | ICD-10-CM | POA: Diagnosis not present

## 2017-04-13 DIAGNOSIS — Z9181 History of falling: Secondary | ICD-10-CM | POA: Diagnosis not present

## 2017-04-13 DIAGNOSIS — M6281 Muscle weakness (generalized): Secondary | ICD-10-CM | POA: Diagnosis not present

## 2017-04-13 NOTE — Telephone Encounter (Signed)
APPT MADE AND [facility] IS AWARE OF DATE AND TIME

## 2017-04-13 NOTE — Telephone Encounter (Signed)
Holding spot for TCS+/-EGD on 06/05/17 @ 10:30. His dialysis days are Tuesday, Thursday,Saturday.

## 2017-04-14 DIAGNOSIS — D631 Anemia in chronic kidney disease: Secondary | ICD-10-CM | POA: Diagnosis not present

## 2017-04-14 DIAGNOSIS — Z9181 History of falling: Secondary | ICD-10-CM | POA: Diagnosis not present

## 2017-04-14 DIAGNOSIS — Z23 Encounter for immunization: Secondary | ICD-10-CM | POA: Diagnosis not present

## 2017-04-14 DIAGNOSIS — T82898A Other specified complication of vascular prosthetic devices, implants and grafts, initial encounter: Secondary | ICD-10-CM | POA: Diagnosis not present

## 2017-04-14 DIAGNOSIS — N186 End stage renal disease: Secondary | ICD-10-CM | POA: Diagnosis not present

## 2017-04-14 DIAGNOSIS — R279 Unspecified lack of coordination: Secondary | ICD-10-CM | POA: Diagnosis not present

## 2017-04-14 DIAGNOSIS — M6281 Muscle weakness (generalized): Secondary | ICD-10-CM | POA: Diagnosis not present

## 2017-04-14 DIAGNOSIS — Z992 Dependence on renal dialysis: Secondary | ICD-10-CM | POA: Diagnosis not present

## 2017-04-14 DIAGNOSIS — D509 Iron deficiency anemia, unspecified: Secondary | ICD-10-CM | POA: Diagnosis not present

## 2017-04-14 NOTE — Telephone Encounter (Signed)
Ask camille what we should do.

## 2017-04-14 NOTE — Telephone Encounter (Signed)
[facility] doesn't have anything sooner than 06/05/17

## 2017-04-14 NOTE — Telephone Encounter (Signed)
Sorry but we need TCS+/- EGD to be sooner than 05/2017.

## 2017-04-15 DIAGNOSIS — M6281 Muscle weakness (generalized): Secondary | ICD-10-CM | POA: Diagnosis not present

## 2017-04-15 DIAGNOSIS — S72491D Other fracture of lower end of right femur, subsequent encounter for closed fracture with routine healing: Secondary | ICD-10-CM | POA: Diagnosis not present

## 2017-04-15 DIAGNOSIS — Z9181 History of falling: Secondary | ICD-10-CM | POA: Diagnosis not present

## 2017-04-15 DIAGNOSIS — R279 Unspecified lack of coordination: Secondary | ICD-10-CM | POA: Diagnosis not present

## 2017-04-15 NOTE — Telephone Encounter (Signed)
I have faxed the Lovenox orders to the [facility]

## 2017-04-15 NOTE — Telephone Encounter (Addendum)
Keep OV with me on 04/24/17 to update H+P.   Here are Lovenox bridge orders and we need to make sure [facility] is aware of and gets Lovenox for bridging. DO I NEED TO SEND THEM A SEPARATE RX OR WILL THE FOLLOWING ORDERS SUFFICE?  HE HAS DIALYSIS ON Thursday WHICH WILL BE HIS PROCEDURE DAY SO DIALYSIS MAY HAVE TO ALTER HIS SCHEDULE. PLEASE LET DIALYSIS KNOW.      Please follow the Medication Instructions below for your procedure:  You need to hold your coumadin four days before your procedure and start a Lovenox bridge.  Your procedure is scheduled for 04/27/2017. RX for Lovenox has been sent to your pharmacy. Please pick up asap in case they have to order it, etc.     04/22/2018  Last dose of warfarin   04/23/2018  No Coumadin/warfarin   04/24/2017  No Coumadin/warfarin  Begin Lovenox/enoxaparin [place] 85 mg at 10am and 85 mg at 10pm   04/25/2017  No Coumadin/warfarin Lovenox/enoxaparin [place] 85 mg at10am and 85 mg at10pm  04/26/2017 No Coumadin/warfarin Lovenox/enoxaparin [place] 85 mg at 10amand 85 mg at 10pm  04/27/2017 Day of procedue No Coumadin/warfarin No Lovenox/enoxaparin  Dr. Gala Romney will provide you with further instructions about your coumadin and Lovenox after your procedure.     You will be instructed when to get your next INR checked at the coumadin clinic.

## 2017-04-15 NOTE — Telephone Encounter (Addendum)
Lovenox bridge table below has now been updated to reflect new date of July 2 to avoid dialysis day.  Please make sure patient holds iron 7 days before. According to his med list he is not on iron but please verify.  Let me know if I need to write Lovenox prescription or if this order is sufficient.

## 2017-04-15 NOTE — Telephone Encounter (Signed)
Pt can not change dialysis days so we will have to move his procedure to 04/27/17. Can you adjust his Lovenox?

## 2017-04-15 NOTE — Telephone Encounter (Signed)
We are hold spot on 04/30/17 @ 2:30

## 2017-04-15 NOTE — Telephone Encounter (Signed)
I received a call from the [facility] and they stated they received the order for Zachary [name] Zachary [name], however she needs a copy of his tcs instructions faxed to [phone]  Routing to Ginger

## 2017-04-16 ENCOUNTER — Other Ambulatory Visit: Payer: Self-pay

## 2017-04-16 DIAGNOSIS — D631 Anemia in chronic kidney disease: Secondary | ICD-10-CM | POA: Diagnosis not present

## 2017-04-16 DIAGNOSIS — T82898A Other specified complication of vascular prosthetic devices, implants and grafts, initial encounter: Secondary | ICD-10-CM | POA: Diagnosis not present

## 2017-04-16 DIAGNOSIS — Z9181 History of falling: Secondary | ICD-10-CM | POA: Diagnosis not present

## 2017-04-16 DIAGNOSIS — N186 End stage renal disease: Secondary | ICD-10-CM | POA: Diagnosis not present

## 2017-04-16 DIAGNOSIS — Z992 Dependence on renal dialysis: Secondary | ICD-10-CM | POA: Diagnosis not present

## 2017-04-16 DIAGNOSIS — R279 Unspecified lack of coordination: Secondary | ICD-10-CM | POA: Diagnosis not present

## 2017-04-16 DIAGNOSIS — D509 Iron deficiency anemia, unspecified: Secondary | ICD-10-CM | POA: Diagnosis not present

## 2017-04-16 DIAGNOSIS — M6281 Muscle weakness (generalized): Secondary | ICD-10-CM | POA: Diagnosis not present

## 2017-04-16 DIAGNOSIS — D649 Anemia, unspecified: Secondary | ICD-10-CM

## 2017-04-16 DIAGNOSIS — Z23 Encounter for immunization: Secondary | ICD-10-CM | POA: Diagnosis not present

## 2017-04-16 DIAGNOSIS — K921 Melena: Secondary | ICD-10-CM

## 2017-04-16 NOTE — Telephone Encounter (Signed)
I have faxed over the TCS instructions to the [facility]. I have also put in the orders for the TCS+/- EGD

## 2017-04-17 DIAGNOSIS — M6281 Muscle weakness (generalized): Secondary | ICD-10-CM | POA: Diagnosis not present

## 2017-04-17 DIAGNOSIS — Z9181 History of falling: Secondary | ICD-10-CM | POA: Diagnosis not present

## 2017-04-17 DIAGNOSIS — R279 Unspecified lack of coordination: Secondary | ICD-10-CM | POA: Diagnosis not present

## 2017-04-18 DIAGNOSIS — T82898A Other specified complication of vascular prosthetic devices, implants and grafts, initial encounter: Secondary | ICD-10-CM | POA: Diagnosis not present

## 2017-04-18 DIAGNOSIS — D509 Iron deficiency anemia, unspecified: Secondary | ICD-10-CM | POA: Diagnosis not present

## 2017-04-18 DIAGNOSIS — N186 End stage renal disease: Secondary | ICD-10-CM | POA: Diagnosis not present

## 2017-04-18 DIAGNOSIS — Z23 Encounter for immunization: Secondary | ICD-10-CM | POA: Diagnosis not present

## 2017-04-18 DIAGNOSIS — D631 Anemia in chronic kidney disease: Secondary | ICD-10-CM | POA: Diagnosis not present

## 2017-04-18 DIAGNOSIS — Z992 Dependence on renal dialysis: Secondary | ICD-10-CM | POA: Diagnosis not present

## 2017-04-20 DIAGNOSIS — Z9181 History of falling: Secondary | ICD-10-CM | POA: Diagnosis not present

## 2017-04-20 DIAGNOSIS — R279 Unspecified lack of coordination: Secondary | ICD-10-CM | POA: Diagnosis not present

## 2017-04-20 DIAGNOSIS — M6281 Muscle weakness (generalized): Secondary | ICD-10-CM | POA: Diagnosis not present

## 2017-04-21 DIAGNOSIS — D631 Anemia in chronic kidney disease: Secondary | ICD-10-CM | POA: Diagnosis not present

## 2017-04-21 DIAGNOSIS — Z9181 History of falling: Secondary | ICD-10-CM | POA: Diagnosis not present

## 2017-04-21 DIAGNOSIS — N186 End stage renal disease: Secondary | ICD-10-CM | POA: Diagnosis not present

## 2017-04-21 DIAGNOSIS — R279 Unspecified lack of coordination: Secondary | ICD-10-CM | POA: Diagnosis not present

## 2017-04-21 DIAGNOSIS — T82898A Other specified complication of vascular prosthetic devices, implants and grafts, initial encounter: Secondary | ICD-10-CM | POA: Diagnosis not present

## 2017-04-21 DIAGNOSIS — Z23 Encounter for immunization: Secondary | ICD-10-CM | POA: Diagnosis not present

## 2017-04-21 DIAGNOSIS — M6281 Muscle weakness (generalized): Secondary | ICD-10-CM | POA: Diagnosis not present

## 2017-04-21 DIAGNOSIS — D509 Iron deficiency anemia, unspecified: Secondary | ICD-10-CM | POA: Diagnosis not present

## 2017-04-21 DIAGNOSIS — Z992 Dependence on renal dialysis: Secondary | ICD-10-CM | POA: Diagnosis not present

## 2017-04-22 DIAGNOSIS — Z992 Dependence on renal dialysis: Secondary | ICD-10-CM | POA: Diagnosis not present

## 2017-04-22 DIAGNOSIS — D631 Anemia in chronic kidney disease: Secondary | ICD-10-CM | POA: Diagnosis not present

## 2017-04-22 DIAGNOSIS — Z23 Encounter for immunization: Secondary | ICD-10-CM | POA: Diagnosis not present

## 2017-04-22 DIAGNOSIS — T82898A Other specified complication of vascular prosthetic devices, implants and grafts, initial encounter: Secondary | ICD-10-CM | POA: Diagnosis not present

## 2017-04-22 DIAGNOSIS — M6281 Muscle weakness (generalized): Secondary | ICD-10-CM | POA: Diagnosis not present

## 2017-04-22 DIAGNOSIS — N186 End stage renal disease: Secondary | ICD-10-CM | POA: Diagnosis not present

## 2017-04-22 DIAGNOSIS — Z9181 History of falling: Secondary | ICD-10-CM | POA: Diagnosis not present

## 2017-04-22 DIAGNOSIS — R279 Unspecified lack of coordination: Secondary | ICD-10-CM | POA: Diagnosis not present

## 2017-04-22 DIAGNOSIS — D509 Iron deficiency anemia, unspecified: Secondary | ICD-10-CM | POA: Diagnosis not present

## 2017-04-23 ENCOUNTER — Other Ambulatory Visit (HOSPITAL_COMMUNITY)
Admission: RE | Admit: 2017-04-23 | Discharge: 2017-04-23 | Disposition: A | Discharge status: Home / Self Care | Payer: Medicare Other | Source: Skilled Nursing Facility | Attending: Internal Medicine | Admitting: Internal Medicine

## 2017-04-23 DIAGNOSIS — R279 Unspecified lack of coordination: Secondary | ICD-10-CM | POA: Diagnosis not present

## 2017-04-23 DIAGNOSIS — Z23 Encounter for immunization: Secondary | ICD-10-CM | POA: Diagnosis not present

## 2017-04-23 DIAGNOSIS — M6281 Muscle weakness (generalized): Secondary | ICD-10-CM | POA: Diagnosis not present

## 2017-04-23 DIAGNOSIS — N186 End stage renal disease: Secondary | ICD-10-CM | POA: Diagnosis not present

## 2017-04-23 DIAGNOSIS — D631 Anemia in chronic kidney disease: Secondary | ICD-10-CM | POA: Diagnosis not present

## 2017-04-23 DIAGNOSIS — Z992 Dependence on renal dialysis: Secondary | ICD-10-CM | POA: Diagnosis not present

## 2017-04-23 DIAGNOSIS — Z7901 Long term (current) use of anticoagulants: Secondary | ICD-10-CM | POA: Insufficient documentation

## 2017-04-23 DIAGNOSIS — D509 Iron deficiency anemia, unspecified: Secondary | ICD-10-CM | POA: Diagnosis not present

## 2017-04-23 DIAGNOSIS — Z9181 History of falling: Secondary | ICD-10-CM | POA: Diagnosis not present

## 2017-04-23 DIAGNOSIS — T82898A Other specified complication of vascular prosthetic devices, implants and grafts, initial encounter: Secondary | ICD-10-CM | POA: Diagnosis not present

## 2017-04-23 LAB — PROTIME-INR
INR: 1.84
Prothrombin Time: 21.5 seconds — ABNORMAL HIGH (ref 11.4–15.2)

## 2017-04-24 ENCOUNTER — Encounter: Payer: Self-pay | Admitting: Gastroenterology

## 2017-04-24 ENCOUNTER — Ambulatory Visit (INDEPENDENT_AMBULATORY_CARE_PROVIDER_SITE_OTHER): Payer: Medicare Other | Admitting: Gastroenterology

## 2017-04-24 VITALS — BP 104/72 | HR 104 | Temp 98.6°F

## 2017-04-24 DIAGNOSIS — R195 Other fecal abnormalities: Secondary | ICD-10-CM

## 2017-04-24 DIAGNOSIS — M6281 Muscle weakness (generalized): Secondary | ICD-10-CM | POA: Diagnosis not present

## 2017-04-24 DIAGNOSIS — Z9181 History of falling: Secondary | ICD-10-CM | POA: Diagnosis not present

## 2017-04-24 DIAGNOSIS — R279 Unspecified lack of coordination: Secondary | ICD-10-CM | POA: Diagnosis not present

## 2017-04-24 NOTE — Assessment & Plan Note (Signed)
71 year old gentleman, [place] of [place], who presents to schedule colonoscopy +/- EGD for anemia and heme-positive stools. History of right distal femur fracture back in January, complicated by right femoral DVT on Coumadin since February. End-stage renal disease on hemodialysis, chronic anemia on Epogen and previously iron. He required blood transfusions postoperatively back in January. His hemoglobin has improved somewhat since we initially saw him in April. There is been no overt GI bleeding. No prior EGD or colonoscopy.  Decision was made to proceed with colonoscopy plus or minus EGD at this time. Plan for Lovenox bridging given history of DVT. I have discussed plans with the patient's legal guardian, [name]. She will be out of town on Monday, date of his procedure, but there will be a coworker present to sign consents.  I have discussed the risks, alternatives, benefits with regards to but not limited to the risk of reaction to medication, bleeding, infection, perforation.  Written consent to be obtained.  I have also discussed plan with Granville Lewis, PA-C add. Nursing center who confirmed that patient is a ward of the state and deemed incompetent.

## 2017-04-24 NOTE — Patient Instructions (Signed)
1. Colonoscopy and possible upper endoscopy as scheduled. Instructions for bowel prep and Lovenox as already provided.

## 2017-04-24 NOTE — Progress Notes (Signed)
Primary Care Physician:  [name], MD  Primary Gastroenterologist:  Garfield Cornea, MD   Chief Complaint  Patient presents with  . update h+p    HPI:  Zachary [name] is a 71 y.o. male here to schedule colonoscopy plus or minus EGD for anemia and heme-positive stool. He has a history of right distal femur fracture back in January, complicated by right femoral DVT now on Coumadin. DVT detected in February. He also has end-stage renal disease on hemodialysis, chronic anemia on Epogen and iron. We initially saw him in April. It was noted that his hemoglobin had been stable since January. He did require blood transfusions postoperatively. His MCV had been climbing. B12 and folate were normal in May. He denied and there was no report of overt GI bleeding but was heme positive. No reported EGD or colonoscopy previously.   Because of acute DVT we decided to postpone workup initially. His hemoglobin was stable. But now it was felt that it is time to pursue colonoscopy with possible EGD with Lovenox bridging.   Patient has a legal guardian, Zachary [name] spell. Phone number [phone] extension [phone]. Patient was deemed incompetent and is ward of the state. I have spoken to Ms. Spell and she is in agreement with TCS+/-EGD and will have her co-worker available at [facility] to sign consent day of procedures. I also discussed plane with Zachary [name] at [facility] who also confirmed that patient is unable to make his own decisions.   Patient is communicative. Reliability of history questionable. He denies any GI concerns specifically abdominal pain, appetite issues, melena, rectal bleeding, constipation, diarrhea.   No current outpatient prescriptions on file.   No current facility-administered medications for this visit.    Current Outpatient Prescriptions on File Prior to Visit  Medication Sig Dispense Refill  . Amino Acids-Protein Hydrolys (FEEDING SUPPLEMENT, PRO-STAT 64,) LIQD Take 30 mLs by  mouth 2 (two) times daily between meals.    Marland Kitchen atorvastatin (LIPITOR) 40 MG tablet Take 40 mg by mouth every evening.    . enoxaparin (LOVENOX) 100 MG/ML injection Inject 85 mg into the skin 2 (two) times daily.    Marland Kitchen epoetin alfa (EPOGEN,PROCRIT) 4000 UNIT/ML injection Inject 4,000 Units into the vein every 14 (fourteen) days.    Marland Kitchen levothyroxine (SYNTHROID, LEVOTHROID) 75 MCG tablet Take 75 mcg by mouth daily before breakfast.     . Multiple Vitamin (MULTIVITAMIN WITH MINERALS) TABS tablet Take 1 tablet by mouth every evening.     . Neo-Bacit-Poly-Lidocaine (FIRST AID PLUS LIDOCAINE EX) Apply 1 application topically 3 (three) times a week. Prior to dialysis    . Nutritional Supplements (NEPRO) LIQD Take 1 can by mouth twice a day    . omeprazole (PRILOSEC) 40 MG capsule Take 40 mg by mouth every evening.    . tamsulosin (FLOMAX) 0.4 MG CAPS capsule Take 0.4 mg by mouth daily at 8 pm.     . torsemide (DEMADEX) 10 MG tablet Take 5 tablets (50 mg total) by mouth daily. 30 tablet 3  . warfarin (COUMADIN) 4 MG tablet Take 4 mg by mouth daily at 6 PM.  ON HOLD    No current facility-administered medications on file prior to visit.       Allergies as of 04/24/2017  . (No Known Allergies)    Past Medical History:  Diagnosis Date  . Abnormal CT scan, kidney 10/06/2011  . Acute pyelonephritis 10/07/2011  . Anemia    normocytic  . Anxiety  mental retardation  . Bladder wall thickening 10/06/2011  . BPH (benign prostatic hypertrophy)   . Diabetes mellitus   . Edema     history of lower extremity edema  . Heme positive stool   . Hydronephrosis   . Hyperkalemia   . Hyperlipidemia   . Hypernatremia   . Hypertension   . Hypothyroidism   . Impaired speech   . 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 MR (mental retardation)   . Obstructive uropathy   . 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 Poor historian poor historian  . Protein calorie malnutrition ([place])   . Pyelonephritis   .  Renal failure (ARF), acute on chronic (HCC)   . Renal insufficiency    chronic history  . Smoking   . Uremia   . Urinary retention   . UTI (lower urinary tract infection) 10/06/2011  . UTI (lower urinary tract infection)     Past Surgical History:  Procedure Laterality Date  . AV FISTULA PLACEMENT Left 07/06/2015   Procedure:  INSERTION LEFT ARM ARTERIOVENOUS GORTEX GRAFT;  Surgeon: Angelia Mould, MD;  Location: [place];  Service: Vascular;  Laterality: Left;  . AV FISTULA PLACEMENT Right 02/26/2016   Procedure: ARTERIOVENOUS (AV) FISTULA CREATION ;  Surgeon: Angelia Mould, MD;  Location: [place];  Service: Vascular;  Laterality: Right;  . [place] REMOVAL Left 10/09/2015   Procedure: REMOVAL OF ARTERIOVENOUS GORETEX GRAFT ([place]) Evacuation of Lymphocele, Vein Patch angioplasty of brachial artery.;  Surgeon: Angelia Mould, MD;  Location: [place];  Service: Vascular;  Laterality: Left;  . BASCILIC VEIN TRANSPOSITION Right 02/26/2016   Procedure: Right BASCILIC VEIN TRANSPOSITION;  Surgeon: Angelia Mould, MD;  Location: [place];  Service: Vascular;  Laterality: Right;  . CIRCUMCISION N/A 01/04/2014   Procedure: CIRCUMCISION ADULT (procedure #1);  Surgeon: Marissa Nestle, MD;  [facility]: [facility];  Service: Urology;  Laterality: N/A;  . CYSTOSCOPY W/ RETROGRADES Bilateral 06/29/2015   Procedure: CYSTOSCOPY, DILATION OF URETHRAL STRICTURE WITH BILATERAL RETROGRADE PYELOGRAM,SUPRAPUBIC TUBE CHANGE;  Surgeon: Festus Aloe, MD;  [facility];  Service: Urology;  Laterality: Bilateral;  . CYSTOSCOPY WITH URETHRAL DILATATION N/A 12/29/2013   Procedure: CYSTOSCOPY WITH URETHRAL DILATATION;  Surgeon: Marissa Nestle, MD;  [facility];  Service: Urology;  Laterality: N/A;  . ORIF FEMUR FRACTURE Right 11/22/2016   Procedure: OPEN REDUCTION INTERNAL FIXATION (ORIF) DISTAL FEMUR FRACTURE;  Surgeon: Rod Can, MD;  Location: [place];  Service: Orthopedics;  Laterality: Right;  .  PERIPHERAL VASCULAR CATHETERIZATION N/A 10/08/2015   Procedure: A/V Shuntogram;  Surgeon: Angelia Mould, MD;  Location: [place] [facility];  Service: Cardiovascular;  Laterality: N/A;  . TRANSURETHRAL RESECTION OF PROSTATE N/A 01/04/2014   Procedure: TRANSURETHRAL RESECTION OF THE PROSTATE (TURP) (procedure #2);  Surgeon: Marissa Nestle, MD;  [facility]: [facility];  Service: Urology;  Laterality: N/A;    Family History  Problem Relation Age of Onset  . Cancer Mother   . Colon cancer Neg Hx     Social History   Social History  . Marital status: Single    Spouse name: N/A  . Number of children: N/A  . Years of education: N/A   Occupational History  . Not on file.   Social History Main Topics  . Smoking status: Never Smoker  . Smokeless tobacco: Never Used  . Alcohol use No     Comment: occ. use   . Drug use: No  . Sexual activity: No  Other Topics Concern  . Not on file   Social History Narrative   Lives at nursing home.      KCM:[id number] denies  General: Negative for anorexia, weight loss, fever, chills, fatigue, weakness. Eyes: Negative for vision changes.  ENT: Negative for hoarseness, difficulty swallowing, nasal congestion. CV: Negative for chest pain, angina, palpitations, dyspnea on exertion, peripheral edema.  Respiratory: Negative for dyspnea at rest, dyspnea on exertion, cough, sputum, wheezing.  GI: See history of present illness. GU:  Negative for dysuria, hematuria, urinary incontinence, urinary frequency, nocturnal urination.  MS: Negative for joint pain, low back pain.  Derm: Negative for rash or itching.  Neuro: Negative for weakness, abnormal sensation, seizure, frequent headaches, memory loss, confusion.  Psych: Negative for anxiety, depression, suicidal ideation, hallucinations.  Endo: Negative for unusual weight change.  Heme: Negative for bruising or bleeding. Allergy: Negative for rash or hives.    Physical Examination:  BP 104/72    Pulse (!) 104   Temp 98.6 F (37 C) (Oral)    General: Well-nourished, well-developed in no acute distress.  Head: Normocephalic, atraumatic.   Eyes: Conjunctiva pink, no icterus. Mouth: Oropharyngeal mucosa moist and pink , no lesions erythema or exudate. Neck: Supple without thyromegaly, masses, or lymphadenopathy.  Lungs: Clear to auscultation bilaterally.  Heart: Regular rate and rhythm, no murmurs rubs or gallops.  Abdomen: Bowel sounds are normal, nontender, nondistended, no hepatosplenomegaly or masses, no abdominal bruits or    hernia , no rebound or guarding.  Examined in wheelchair Rectal: Deferred Extremities: No lower extremity edema. No clubbing or deformities. Right leg in full length hinged brace Neuro: Alert and oriented x 4 , grossly normal neurologically.  Skin: Warm and dry, no rash or jaundice.   Psych: Alert and cooperative, normal mood and affect.  Labs: Lab Results  Component Value Date   INR 1.84 04/23/2017   INR 2.12 04/09/2017   INR 1.55 04/02/2017   Lab Results  Component Value Date   CREATININE 4.52 (H) 03/04/2017   BUN 65 (H) 03/04/2017   NA 138 03/04/2017   K 4.1 03/04/2017   CL 99 (L) 03/04/2017   CO2 26 03/04/2017   Lab Results  Component Value Date   ALT <5 (L) 11/28/2016   AST 27 11/28/2016   ALKPHOS 68 11/28/2016   BILITOT 0.9 11/28/2016   Lab Results  Component Value Date   WBC 5.3 03/03/2017   HGB 11.2 (L) 03/03/2017   HCT 34.2 (L) 03/03/2017   MCV 103.6 (H) 03/03/2017   PLT 187 03/03/2017   Lab Results  Component Value Date   IRON <5 (L) 11/23/2016   TIBC 148 (L) 11/23/2016   FERRITIN 37 04/05/2016   Lab Results  Component Value Date   VITAMINB12 624 03/03/2017   Lab Results  Component Value Date   FOLATE 21.7 03/03/2017     Imaging Studies: No results found.

## 2017-04-25 DIAGNOSIS — D631 Anemia in chronic kidney disease: Secondary | ICD-10-CM | POA: Diagnosis not present

## 2017-04-25 DIAGNOSIS — D509 Iron deficiency anemia, unspecified: Secondary | ICD-10-CM | POA: Diagnosis not present

## 2017-04-25 DIAGNOSIS — Z992 Dependence on renal dialysis: Secondary | ICD-10-CM | POA: Diagnosis not present

## 2017-04-25 DIAGNOSIS — N186 End stage renal disease: Secondary | ICD-10-CM | POA: Diagnosis not present

## 2017-04-25 DIAGNOSIS — Z23 Encounter for immunization: Secondary | ICD-10-CM | POA: Diagnosis not present

## 2017-04-25 DIAGNOSIS — T82898A Other specified complication of vascular prosthetic devices, implants and grafts, initial encounter: Secondary | ICD-10-CM | POA: Diagnosis not present

## 2017-04-26 DIAGNOSIS — C19 Malignant neoplasm of rectosigmoid junction: Secondary | ICD-10-CM

## 2017-04-26 HISTORY — DX: Malignant neoplasm of rectosigmoid junction: C19

## 2017-04-27 ENCOUNTER — Encounter (HOSPITAL_COMMUNITY): Payer: Self-pay | Admitting: *Deleted

## 2017-04-27 ENCOUNTER — Encounter (HOSPITAL_COMMUNITY)
Admission: RE | Disposition: A | Discharge status: Home / Self Care | Payer: Self-pay | Source: Ambulatory Visit | Attending: Internal Medicine

## 2017-04-27 ENCOUNTER — Inpatient Hospital Stay
Admission: RE | Admit: 2017-04-27 | Discharge: 2017-05-01 | Disposition: A | Discharge status: Nursing Home (Includes ICF) | Payer: Medicare Other | Source: Ambulatory Visit | Attending: Internal Medicine | Admitting: Internal Medicine

## 2017-04-27 ENCOUNTER — Ambulatory Visit (HOSPITAL_COMMUNITY)
Admission: RE | Admit: 2017-04-27 | Discharge: 2017-04-27 | Disposition: A | Discharge status: Home / Self Care | Payer: Medicare Other | Source: Ambulatory Visit | Attending: Internal Medicine | Admitting: Internal Medicine

## 2017-04-27 DIAGNOSIS — E1122 Type 2 diabetes mellitus with diabetic chronic kidney disease: Secondary | ICD-10-CM | POA: Insufficient documentation

## 2017-04-27 DIAGNOSIS — Z9181 History of falling: Secondary | ICD-10-CM | POA: Diagnosis not present

## 2017-04-27 DIAGNOSIS — K5730 Diverticulosis of large intestine without perforation or abscess without bleeding: Secondary | ICD-10-CM | POA: Diagnosis not present

## 2017-04-27 DIAGNOSIS — D631 Anemia in chronic kidney disease: Secondary | ICD-10-CM | POA: Insufficient documentation

## 2017-04-27 DIAGNOSIS — D374 Neoplasm of uncertain behavior of colon: Secondary | ICD-10-CM | POA: Diagnosis not present

## 2017-04-27 DIAGNOSIS — N186 End stage renal disease: Secondary | ICD-10-CM | POA: Diagnosis not present

## 2017-04-27 DIAGNOSIS — Z992 Dependence on renal dialysis: Secondary | ICD-10-CM | POA: Diagnosis not present

## 2017-04-27 DIAGNOSIS — D123 Benign neoplasm of transverse colon: Secondary | ICD-10-CM

## 2017-04-27 DIAGNOSIS — D120 Benign neoplasm of cecum: Secondary | ICD-10-CM | POA: Diagnosis not present

## 2017-04-27 DIAGNOSIS — D125 Benign neoplasm of sigmoid colon: Secondary | ICD-10-CM | POA: Diagnosis not present

## 2017-04-27 DIAGNOSIS — I120 Hypertensive chronic kidney disease with stage 5 chronic kidney disease or end stage renal disease: Secondary | ICD-10-CM | POA: Diagnosis not present

## 2017-04-27 DIAGNOSIS — D122 Benign neoplasm of ascending colon: Secondary | ICD-10-CM | POA: Diagnosis not present

## 2017-04-27 DIAGNOSIS — Z79899 Other long term (current) drug therapy: Secondary | ICD-10-CM | POA: Insufficient documentation

## 2017-04-27 DIAGNOSIS — D124 Benign neoplasm of descending colon: Secondary | ICD-10-CM | POA: Diagnosis not present

## 2017-04-27 DIAGNOSIS — R195 Other fecal abnormalities: Secondary | ICD-10-CM

## 2017-04-27 DIAGNOSIS — D649 Anemia, unspecified: Secondary | ICD-10-CM

## 2017-04-27 DIAGNOSIS — Z7901 Long term (current) use of anticoagulants: Secondary | ICD-10-CM | POA: Insufficient documentation

## 2017-04-27 DIAGNOSIS — M6281 Muscle weakness (generalized): Secondary | ICD-10-CM | POA: Diagnosis not present

## 2017-04-27 DIAGNOSIS — R279 Unspecified lack of coordination: Secondary | ICD-10-CM | POA: Diagnosis not present

## 2017-04-27 DIAGNOSIS — K921 Melena: Secondary | ICD-10-CM

## 2017-04-27 HISTORY — PX: ESOPHAGOGASTRODUODENOSCOPY: SHX5428

## 2017-04-27 HISTORY — PX: COLONOSCOPY: SHX5424

## 2017-04-27 SURGERY — COLONOSCOPY
Anesthesia: Moderate Sedation

## 2017-04-27 MED ORDER — ONDANSETRON HCL 4 MG/2ML IJ SOLN
INTRAMUSCULAR | Status: AC
  Filled 2017-04-27: qty 2

## 2017-04-27 MED ORDER — SODIUM CHLORIDE 0.9 % IV SOLN
INTRAVENOUS | Status: DC
  Administered 2017-04-27: 14:00:00 via INTRAVENOUS

## 2017-04-27 MED ORDER — SPOT INK MARKER SYRINGE KIT
PACK | SUBMUCOSAL | Status: DC | PRN
  Administered 2017-04-27: 4.5 mL via SUBMUCOSAL

## 2017-04-27 MED ORDER — MEPERIDINE HCL 100 MG/ML IJ SOLN
INTRAMUSCULAR | Status: DC | PRN
  Administered 2017-04-27: 50 mg via INTRAVENOUS
  Administered 2017-04-27: 25 mg via INTRAVENOUS

## 2017-04-27 MED ORDER — MIDAZOLAM HCL 5 MG/5ML IJ SOLN
INTRAMUSCULAR | Status: DC
Start: 2017-04-27 — End: 2017-04-27
  Filled 2017-04-27: qty 10

## 2017-04-27 MED ORDER — SIMETHICONE 40 MG/0.6ML PO SUSP
ORAL | Status: DC | PRN
  Administered 2017-04-27: 15:00:00

## 2017-04-27 MED ORDER — MIDAZOLAM HCL 5 MG/5ML IJ SOLN
INTRAMUSCULAR | Status: DC | PRN
  Administered 2017-04-27: 2 mg via INTRAVENOUS
  Administered 2017-04-27: 1 mg via INTRAVENOUS
  Administered 2017-04-27: 2 mg via INTRAVENOUS

## 2017-04-27 MED ORDER — MEPERIDINE HCL 100 MG/ML IJ SOLN
INTRAMUSCULAR | Status: AC
  Filled 2017-04-27: qty 2

## 2017-04-27 MED ORDER — ONDANSETRON HCL 4 MG/2ML IJ SOLN
INTRAMUSCULAR | Status: DC | PRN
  Administered 2017-04-27: 4 mg via INTRAVENOUS

## 2017-04-27 MED ORDER — LIDOCAINE VISCOUS 2 % MT SOLN
OROMUCOSAL | Status: DC | PRN
  Administered 2017-04-27: 4 mL via OROMUCOSAL

## 2017-04-27 MED ORDER — SPOT INK MARKER SYRINGE KIT
PACK | SUBMUCOSAL | Status: AC
  Filled 2017-04-27: qty 5

## 2017-04-27 MED ORDER — LIDOCAINE VISCOUS 2 % MT SOLN
OROMUCOSAL | Status: AC
  Filled 2017-04-27: qty 15

## 2017-04-27 NOTE — H&P (View-Only) (Signed)
Primary Care Physician:  [name], MD  Primary Gastroenterologist:  Garfield Cornea, MD   Chief Complaint  Patient presents with  . update h+p    HPI:  Zachary [name] is a 71 y.o. male here to schedule colonoscopy plus or minus EGD for anemia and heme-positive stool. He has a history of right distal femur fracture back in January, complicated by right femoral DVT now on Coumadin. DVT detected in February. He also has end-stage renal disease on hemodialysis, chronic anemia on Epogen and iron. We initially saw him in April. It was noted that his hemoglobin had been stable since January. He did require blood transfusions postoperatively. His MCV had been climbing. B12 and folate were normal in May. He denied and there was no report of overt GI bleeding but was heme positive. No reported EGD or colonoscopy previously.   Because of acute DVT we decided to postpone workup initially. His hemoglobin was stable. But now it was felt that it is time to pursue colonoscopy with possible EGD with Lovenox bridging.   Patient has a legal guardian, [name]. Phone number [phone] extension [phone]. Patient was deemed incompetent and is ward of the state. I have spoken to Ms. Spell and she is in agreement with TCS+/-EGD and will have her co-worker available at [facility] to sign consent day of procedures. I also discussed plane with Lorne Skeens at [facility] who also confirmed that patient is unable to make his own decisions.   Patient is communicative. Reliability of history questionable. He denies any GI concerns specifically abdominal pain, appetite issues, melena, rectal bleeding, constipation, diarrhea.   No current outpatient prescriptions on file.   No current facility-administered medications for this visit.    Current Outpatient Prescriptions on File Prior to Visit  Medication Sig Dispense Refill  . Amino Acids-Protein Hydrolys (FEEDING SUPPLEMENT, PRO-STAT 64,) LIQD Take 30 mLs by  mouth 2 (two) times daily between meals.    Marland Kitchen atorvastatin (LIPITOR) 40 MG tablet Take 40 mg by mouth every evening.    . enoxaparin (LOVENOX) 100 MG/ML injection Inject 85 mg into the skin 2 (two) times daily.    Marland Kitchen epoetin alfa (EPOGEN,PROCRIT) 4000 UNIT/ML injection Inject 4,000 Units into the vein every 14 (fourteen) days.    Marland Kitchen levothyroxine (SYNTHROID, LEVOTHROID) 75 MCG tablet Take 75 mcg by mouth daily before breakfast.     . Multiple Vitamin (MULTIVITAMIN WITH MINERALS) TABS tablet Take 1 tablet by mouth every evening.     . Neo-Bacit-Poly-Lidocaine (FIRST AID PLUS LIDOCAINE EX) Apply 1 application topically 3 (three) times a week. Prior to dialysis    . Nutritional Supplements (NEPRO) LIQD Take 1 can by mouth twice a day    . omeprazole (PRILOSEC) 40 MG capsule Take 40 mg by mouth every evening.    . tamsulosin (FLOMAX) 0.4 MG CAPS capsule Take 0.4 mg by mouth daily at 8 pm.     . torsemide (DEMADEX) 10 MG tablet Take 5 tablets (50 mg total) by mouth daily. 30 tablet 3  . warfarin (COUMADIN) 4 MG tablet Take 4 mg by mouth daily at 6 PM.  ON HOLD    No current facility-administered medications on file prior to visit.       Allergies as of 04/24/2017  . (No Known Allergies)    Past Medical History:  Diagnosis Date  . Abnormal CT scan, kidney 10/06/2011  . Acute pyelonephritis 10/07/2011  . Anemia    normocytic  . Anxiety  mental retardation  . Bladder wall thickening 10/06/2011  . BPH (benign prostatic hypertrophy)   . Diabetes mellitus   . Edema     history of lower extremity edema  . Heme positive stool   . Hydronephrosis   . Hyperkalemia   . Hyperlipidemia   . Hypernatremia   . Hypertension   . Hypothyroidism   . Impaired speech   . 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 MR (mental retardation)   . Obstructive uropathy   . 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 Poor historian poor historian  . Protein calorie malnutrition ([place])   . Pyelonephritis   .  Renal failure (ARF), acute on chronic (HCC)   . Renal insufficiency    chronic history  . Smoking   . Uremia   . Urinary retention   . UTI (lower urinary tract infection) 10/06/2011  . UTI (lower urinary tract infection)     Past Surgical History:  Procedure Laterality Date  . AV FISTULA PLACEMENT Left 07/06/2015   Procedure:  INSERTION LEFT ARM ARTERIOVENOUS GORTEX GRAFT;  Surgeon: Angelia Mould, MD;  Location: [place];  Service: Vascular;  Laterality: Left;  . AV FISTULA PLACEMENT Right 02/26/2016   Procedure: ARTERIOVENOUS (AV) FISTULA CREATION ;  Surgeon: Angelia Mould, MD;  Location: [place];  Service: Vascular;  Laterality: Right;  . [place] REMOVAL Left 10/09/2015   Procedure: REMOVAL OF ARTERIOVENOUS GORETEX GRAFT ([place]) Evacuation of Lymphocele, Vein Patch angioplasty of brachial artery.;  Surgeon: Angelia Mould, MD;  Location: [place];  Service: Vascular;  Laterality: Left;  . BASCILIC VEIN TRANSPOSITION Right 02/26/2016   Procedure: Right BASCILIC VEIN TRANSPOSITION;  Surgeon: Angelia Mould, MD;  Location: [place];  Service: Vascular;  Laterality: Right;  . CIRCUMCISION N/A 01/04/2014   Procedure: CIRCUMCISION ADULT (procedure #1);  Surgeon: Marissa Nestle, MD;  [facility];  Service: Urology;  Laterality: N/A;  . CYSTOSCOPY W/ RETROGRADES Bilateral 06/29/2015   Procedure: CYSTOSCOPY, DILATION OF URETHRAL STRICTURE WITH BILATERAL RETROGRADE PYELOGRAM,SUPRAPUBIC TUBE CHANGE;  Surgeon: Festus Aloe, MD;  [facility];  Service: Urology;  Laterality: Bilateral;  . CYSTOSCOPY WITH URETHRAL DILATATION N/A 12/29/2013   Procedure: CYSTOSCOPY WITH URETHRAL DILATATION;  Surgeon: Marissa Nestle, MD;  [facility];  Service: Urology;  Laterality: N/A;  . ORIF FEMUR FRACTURE Right 11/22/2016   Procedure: OPEN REDUCTION INTERNAL FIXATION (ORIF) DISTAL FEMUR FRACTURE;  Surgeon: Rod Can, MD;  Location: [place];  Service: Orthopedics;  Laterality: Right;  .  PERIPHERAL VASCULAR CATHETERIZATION N/A 10/08/2015   Procedure: A/V Shuntogram;  Surgeon: Angelia Mould, MD;  Location: [place] [facility];  Service: Cardiovascular;  Laterality: N/A;  . TRANSURETHRAL RESECTION OF PROSTATE N/A 01/04/2014   Procedure: TRANSURETHRAL RESECTION OF THE PROSTATE (TURP) (procedure #2);  Surgeon: Marissa Nestle, MD;  [facility];  Service: Urology;  Laterality: N/A;    Family History  Problem Relation Age of Onset  . Cancer Mother   . Colon cancer Neg Hx     Social History   Social History  . Marital status: Single    Spouse name: N/A  . Number of children: N/A  . Years of education: N/A   Occupational History  . Not on file.   Social History Main Topics  . Smoking status: Never Smoker  . Smokeless tobacco: Never Used  . Alcohol use No     Comment: occ. use   . Drug use: No  . Sexual activity: No  Other Topics Concern  . Not on file   Social History Narrative   Lives at nursing home.      ZOX:WRUEAVW denies  General: Negative for anorexia, weight loss, fever, chills, fatigue, weakness. Eyes: Negative for vision changes.  ENT: Negative for hoarseness, difficulty swallowing, nasal congestion. CV: Negative for chest pain, angina, palpitations, dyspnea on exertion, peripheral edema.  Respiratory: Negative for dyspnea at rest, dyspnea on exertion, cough, sputum, wheezing.  GI: See history of present illness. GU:  Negative for dysuria, hematuria, urinary incontinence, urinary frequency, nocturnal urination.  MS: Negative for joint pain, low back pain.  Derm: Negative for rash or itching.  Neuro: Negative for weakness, abnormal sensation, seizure, frequent headaches, memory loss, confusion.  Psych: Negative for anxiety, depression, suicidal ideation, hallucinations.  Endo: Negative for unusual weight change.  Heme: Negative for bruising or bleeding. Allergy: Negative for rash or hives.    Physical Examination:  BP 104/72    Pulse (!) 104   Temp 98.6 F (37 C) (Oral)    General: Well-nourished, well-developed in no acute distress.  Head: Normocephalic, atraumatic.   Eyes: Conjunctiva pink, no icterus. Mouth: Oropharyngeal mucosa moist and pink , no lesions erythema or exudate. Neck: Supple without thyromegaly, masses, or lymphadenopathy.  Lungs: Clear to auscultation bilaterally.  Heart: Regular rate and rhythm, no murmurs rubs or gallops.  Abdomen: Bowel sounds are normal, nontender, nondistended, no hepatosplenomegaly or masses, no abdominal bruits or    hernia , no rebound or guarding.  Examined in wheelchair Rectal: Deferred Extremities: No lower extremity edema. No clubbing or deformities. Right leg in full length hinged brace Neuro: Alert and oriented x 4 , grossly normal neurologically.  Skin: Warm and dry, no rash or jaundice.   Psych: Alert and cooperative, normal mood and affect.  Labs: Lab Results  Component Value Date   INR 1.84 04/23/2017   INR 2.12 04/09/2017   INR 1.55 04/02/2017   Lab Results  Component Value Date   CREATININE 4.52 (H) 03/04/2017   BUN 65 (H) 03/04/2017   NA 138 03/04/2017   K 4.1 03/04/2017   CL 99 (L) 03/04/2017   CO2 26 03/04/2017   Lab Results  Component Value Date   ALT <5 (L) 11/28/2016   AST 27 11/28/2016   ALKPHOS 68 11/28/2016   BILITOT 0.9 11/28/2016   Lab Results  Component Value Date   WBC 5.3 03/03/2017   HGB 11.2 (L) 03/03/2017   HCT 34.2 (L) 03/03/2017   MCV 103.6 (H) 03/03/2017   PLT 187 03/03/2017   Lab Results  Component Value Date   IRON <5 (L) 11/23/2016   TIBC 148 (L) 11/23/2016   FERRITIN 37 04/05/2016   Lab Results  Component Value Date   VITAMINB12 624 03/03/2017   Lab Results  Component Value Date   FOLATE 21.7 03/03/2017     Imaging Studies: No results found.

## 2017-04-27 NOTE — Progress Notes (Signed)
CC'D TO PCP °

## 2017-04-27 NOTE — Op Note (Addendum)
[facility] Patient Name: Zachary [name] Procedure Date: 04/27/2017 2:34 PM MRN: [id number] Date of Birth: 04/03/1946 Attending MD: Norvel Richards , MD CSN: [id number] Age: 71 Admit Type: Outpatient Procedure:                Colonoscopy Indications:              Heme positive stool Providers:                Norvel Richards, MD, Otis Peak B. Gwenlyn Perking RN, RN,                            Randa Spike, Technician Referring MD:              Medicines:                Midazolam 5 mg IV, Meperidine 75 mg IV, Ondansetron                            4 mg IV Complications:            No immediate complications. Estimated Blood Loss:     Estimated blood loss was minimal. Procedure:                Pre-Anesthesia Assessment:                           - Prior to the procedure, a History and Physical                            was performed, and patient medications and                            allergies were reviewed. [name] tolerance of                            previous anesthesia was also reviewed. The risks                            and benefits of the procedure and the sedation                            options and risks were discussed with the patient.                            All questions were answered, and informed consent                            was obtained. Prior Anticoagulants: The patient                            last took Coumadin (warfarin) 4 days and Lovenox                            (enoxaparin) 1 day prior to the procedure. ASA  Grade Assessment: III - A patient with severe                            systemic disease. After reviewing the risks and                            benefits, the patient was deemed in satisfactory                            condition to undergo the procedure.                           After obtaining informed consent, the colonoscope                            was passed under direct vision. Throughout  the                            procedure, the patient's blood pressure, pulse, and                            oxygen saturations were monitored continuously. The                            EC-[id number] ([id number]) scope was introduced through                            the anus and advanced to the the terminal ileum,                            with identification of the appendiceal orifice and                            IC valve. The ileocecal valve, appendiceal orifice,                            and rectum were photographed. The entire colon was                            well visualized. The colonoscopy was performed                            without difficulty. The patient tolerated the                            procedure well. The quality of the bowel                            preparation was adequate. Scope In: 2:53:03 PM Scope Out: 3:32:01 PM Scope Withdrawal Time: 0 hours 34 minutes 16 seconds  Total Procedure Duration: 0 hours 38 minutes 58 seconds  Findings:      The perianal and digital rectal examinations were normal.      Diverticula were found in the sigmoid colon and descending  colon.      The exam was otherwise without abnormality on direct and retroflexion       views.      4 semi-pedunculated polyps were found in the sigmoid colon, descending       colon and transverse colon. The polyps were 6 to 22 mm in size. These       polyps were removed with a hot snare. Resection and retrieval were       complete. Estimated blood loss was minimal. The 3 largest polyps       underwent prophylactic clipping of their bases prior to resection to       minimize chance of post polypectomy bleeding.      There was an annular mass in the mid ascending colon. 3 x 5 cm. this was       biopsied. There were multiple sessile polyps scattered about ileocecal       valve and cecum which I did not remove. They were upstream of the       annular lesion. I tattooed the mucosa several centimeters  distal to the       annular lesion as a landmark. Impression:               - Diverticulosis in the sigmoid colon and in the                            descending colon.                           - The rectum was otherwise normal on direct and                            retroflexion views. annular mass in the ascending                            colon likely representing colon cancer?biopsied.                            Cecal and ileocecal valve polyps not removed.                           - Multiple 6 to 22 mm polyps in the sigmoid colon,                            in the descending colon and in the transverse                            colon, removed with a hot snare. Resected and                            retrieved. clips placed. All polyps not removed as                            described above.                           Rectum is clean. If distal transverse, descending  or sigmoid polyps contain advanced pathology,                            patient would benefit from a subtotal colectomy. If                            not, would plan for a right hemicolectomy taking                            everything from the tattoo line proximally. Moderate Sedation:      Moderate (conscious) sedation was administered by the endoscopy nurse       and supervised by the endoscopist. The following parameters were       monitored: oxygen saturation, heart rate, blood pressure, respiratory       rate, EKG, adequacy of pulmonary ventilation, and response to care.       Total physician intraservice time was 50 minutes. Recommendation:           - Patient has a contact number available for                            emergencies. The signs and symptoms of potential                            delayed complications were discussed with the                            patient. Return to normal activities tomorrow.                            Written discharge instructions were  provided to the                            patient.                           - Advance diet as tolerated.                           - Continue present medications.                           - Await pathology results.                           - Repeat colonoscopy date to be determined after                            pending pathology results are reviewed for                            surveillance based on pathology results.                           - Resume Coumadin (warfarin) at prior dose  tomorrow. No Lovenox bridging post procedure. Procedure Code(s):        --- Professional ---                           [phone], Colonoscopy, flexible; with removal of                            tumor(s), polyp(s), or other lesion(s) by snare                            technique                           99152, Moderate sedation services provided by the                            same physician or other qualified health care                            professional performing the diagnostic or                            therapeutic service that the sedation supports,                            requiring the presence of an independent trained                            observer to assist in the monitoring of the                            patient's level of consciousness and physiological                            status; initial 15 minutes of intraservice time,                            patient age 75 years or older                           [phone], Moderate sedation services; each additional                            15 minutes intraservice time                           99153, Moderate sedation services; each additional                            15 minutes intraservice time Diagnosis Code(s):        --- Professional ---                           D12.5, Benign neoplasm of sigmoid colon  D12.4, Benign neoplasm of descending colon                            D12.3, Benign neoplasm of transverse colon (hepatic                            flexure or splenic flexure)                           R19.5, Other fecal abnormalities                           K57.30, Diverticulosis of large intestine without                            perforation or abscess without bleeding CPT copyright 2016 [employer]. All rights reserved. The codes documented in this report are preliminary and upon coder review may  be revised to meet current compliance requirements. Cristopher Estimable. Marshe Shrestha, MD Norvel Richards, MD 04/27/2017 4:02:41 PM This report has been signed electronically. Number of Addenda: 0

## 2017-04-27 NOTE — Discharge Instructions (Addendum)
Colonoscopy Discharge Instructions  Read the instructions outlined below and refer to this sheet in the next few weeks. These discharge instructions provide you with general information on caring for yourself after you leave the hospital. Your doctor may also give you specific instructions. While your treatment has been planned according to the most current medical practices available, unavoidable complications occasionally occur. If you have any problems or questions after discharge, call Dr. Gala Romney at [phone]. ACTIVITY  You may resume your regular activity, but move at a slower pace for the next 24 hours.   Take frequent rest periods for the next 24 hours.   Walking will help get rid of the air and reduce the bloated feeling in your belly (abdomen).   No driving for 24 hours (because of the medicine (anesthesia) used during the test).    Do not sign any important legal documents or operate any machinery for 24 hours (because of the anesthesia used during the test).  NUTRITION  Drink plenty of fluids.   You may resume your normal diet as instructed by your doctor.   Begin with a light meal and progress to your normal diet. Heavy or fried foods are harder to digest and may make you feel sick to your stomach (nauseated).   Avoid alcoholic beverages for 24 hours or as instructed.  MEDICATIONS  You may resume your normal medications unless your doctor tells you otherwise.  WHAT YOU CAN EXPECT TODAY  Some feelings of bloating in the abdomen.   Passage of more gas than usual.   Spotting of blood in your stool or on the toilet paper.  IF YOU HAD POLYPS REMOVED DURING THE COLONOSCOPY:  No aspirin products for 7 days or as instructed.   No alcohol for 7 days or as instructed.   Eat a soft diet for the next 24 hours.  FINDING OUT THE RESULTS OF YOUR TEST Not all test results are available during your visit. If your test results are not back during the visit, make an appointment  with your caregiver to find out the results. Do not assume everything is normal if you have not heard from your caregiver or the medical facility. It is important for you to follow up on all of your test results.  SEEK IMMEDIATE MEDICAL ATTENTION IF:  You have more than a spotting of blood in your stool.   Your belly is swollen (abdominal distention).   You are nauseated or vomiting.   You have a temperature over 101.   You have abdominal pain or discomfort that is severe or gets worse throughout the day.     Diverticulosis and colon polyp information provided  Multiple large colon polyps removed today. Multiple metallic clips placed to prevent bleeding.  Mass in the right colon-biopsied  Patient will likely need an operation in the near future.  No future MRI until clips gone:  Begin Coumadin at the usual dosing tomorrow. No Lovenox.  Further recommendations to follow.   Diverticulosis Diverticulosis is a condition that develops when small pouches (diverticula) form in the wall of the large intestine (colon). The colon is where water is absorbed and stool is formed. The pouches form when the inside layer of the colon pushes through weak spots in the outer layers of the colon. You may have a few pouches or many of them. What are the causes? The cause of this condition is not known. What increases the risk? The following factors may make you more likely to develop  this condition:  Being older than age 17. Your risk for this condition increases with age. Diverticulosis is rare among people younger than age 71. By age 34, many people have it.  Eating a low-fiber diet.  Having frequent constipation.  Being overweight.  Not getting enough exercise.  Smoking.  Taking over-the-counter pain medicines, like aspirin and ibuprofen.  Having a family history of diverticulosis.  What are the signs or symptoms? In most people, there are no symptoms of this condition. If you do  have symptoms, they may include:  Bloating.  Cramps in the abdomen.  Constipation or diarrhea.  Pain in the lower left side of the abdomen.  How is this diagnosed? This condition is most often diagnosed during an exam for other colon problems. Because diverticulosis usually has no symptoms, it often cannot be diagnosed independently. This condition may be diagnosed by:  Using a flexible scope to examine the colon (colonoscopy).  Taking an X-ray of the colon after dye has been put into the colon (barium enema).  Doing a CT scan.  How is this treated? You may not need treatment for this condition if you have never developed an infection related to diverticulosis. If you have had an infection before, treatment may include:  Eating a high-fiber diet. This may include eating more fruits, vegetables, and grains.  Taking a fiber supplement.  Taking a live bacteria supplement (probiotic).  Taking medicine to relax your colon.  Taking antibiotic medicines.  Follow these instructions at home:  Drink 6-8 glasses of water or more each day to prevent constipation.  Try not to strain when you have a bowel movement.  If you have had an infection before: ? Eat more fiber as directed by your health care provider or your diet and nutrition specialist (dietitian). ? Take a fiber supplement or probiotic, if your health care provider approves.  Take over-the-counter and prescription medicines only as told by your health care provider.  If you were prescribed an antibiotic, take it as told by your health care provider. Do not stop taking the antibiotic even if you start to feel better.  Keep all follow-up visits as told by your health care provider. This is important. Contact a health care provider if:  You have pain in your abdomen.  You have bloating.  You have cramps.  You have not had a bowel movement in 3 days. Get help right away if:  Your pain gets worse.  Your bloating  becomes very bad.  You have a fever or chills, and your symptoms suddenly get worse.  You vomit.  You have bowel movements that are bloody or black.  You have bleeding from your rectum. Summary  Diverticulosis is a condition that develops when small pouches (diverticula) form in the wall of the large intestine (colon).  You may have a few pouches or many of them.  This condition is most often diagnosed during an exam for other colon problems.  If you have had an infection related to diverticulosis, treatment may include increasing the fiber in your diet, taking supplements, or taking medicines. This information is not intended to replace advice given to you by your health care provider. Make sure you discuss any questions you have with your health care provider. Document Released: 07/10/2004 Document Revised: 09/01/2016 Document Reviewed: 09/01/2016 Elsevier Interactive Patient Education  2017 [place].  Colon Polyps Polyps are tissue growths inside the body. Polyps can grow in many places, including the large intestine (colon). A polyp  may be a round bump or a mushroom-shaped growth. You could have one polyp or several. Most colon polyps are noncancerous (benign). However, some colon polyps can become cancerous over time. What are the causes? The exact cause of colon polyps is not known. What increases the risk? This condition is more likely to develop in people who:  Have a family history of colon cancer or colon polyps.  Are older than 79 or older than 45 if they are African American.  Have inflammatory bowel disease, such as ulcerative colitis or Crohn disease.  Are overweight.  Smoke cigarettes.  Do not get enough exercise.  Drink too much alcohol.  Eat a diet that is: ? High in fat and red meat. ? Low in fiber.  Had childhood cancer that was treated with abdominal radiation.  What are the signs or symptoms? Most polyps do not cause symptoms. If you have  symptoms, they may include:  Blood coming from your rectum when having a bowel movement.  Blood in your stool.The stool may look dark red or black.  A change in bowel habits, such as constipation or diarrhea.  How is this diagnosed? This condition is diagnosed with a colonoscopy. This is a procedure that uses a lighted, flexible scope to look at the inside of your colon. How is this treated? Treatment for this condition involves removing any polyps that are found. Those polyps will then be tested for cancer. If cancer is found, your health care provider will talk to you about options for colon cancer treatment. Follow these instructions at home: Diet  Eat plenty of fiber, such as fruits, vegetables, and whole grains.  Eat foods that are high in calcium and vitamin D, such as milk, cheese, yogurt, eggs, liver, fish, and broccoli.  Limit foods high in fat, red meats, and processed meats, such as hot dogs, sausage, bacon, and lunch meats.  Maintain a healthy weight, or lose weight if recommended by your health care provider. General instructions  Do not smoke cigarettes.  Do not drink alcohol excessively.  Keep all follow-up visits as told by your health care provider. This is important. This includes keeping regularly scheduled colonoscopies. Talk to your health care provider about when you need a colonoscopy.  Exercise every day or as told by your health care provider. Contact a health care provider if:  You have new or worsening bleeding during a bowel movement.  You have new or increased blood in your stool.  You have a change in bowel habits.  You unexpectedly lose weight. This information is not intended to replace advice given to you by your health care provider. Make sure you discuss any questions you have with your health care provider. Document Released: 07/09/2004 Document Revised: 03/20/2016 Document Reviewed: 09/03/2015 Elsevier Interactive Patient Education   [employer].

## 2017-04-27 NOTE — Interval H&P Note (Signed)
History and Physical Interval Note:  04/27/2017 2:40 PM  Shirlyn Goltz  has presented today for surgery, with the diagnosis of ANEMIA/ BLOOD IN STOOL  The various methods of treatment have been discussed with the patient and family. After consideration of risks, benefits and other options for treatment, the patient has consented to  Procedure(s) with comments: COLONOSCOPY (N/A) - 245 ESOPHAGOGASTRODUODENOSCOPY (EGD) (N/A) as a surgical intervention .  [name] history has been reviewed, patient examined, no change in status, stable for surgery.  I have reviewed the patient's chart and labs.  Questions were answered to the patient's satisfaction.     Irineo Gaulin  No change. Coumadin held Lovenox bridging.  Colonoscopy with possible EGD per plan.  The risks, benefits, limitations, imponderables and alternatives regarding both EGD and colonoscopy have been reviewed with the patient. Questions have been answered. All parties agreeable.

## 2017-04-28 DIAGNOSIS — R1110 Vomiting, unspecified: Secondary | ICD-10-CM | POA: Diagnosis not present

## 2017-04-28 DIAGNOSIS — M6281 Muscle weakness (generalized): Secondary | ICD-10-CM | POA: Diagnosis not present

## 2017-04-28 DIAGNOSIS — Z992 Dependence on renal dialysis: Secondary | ICD-10-CM | POA: Diagnosis not present

## 2017-04-28 DIAGNOSIS — N186 End stage renal disease: Secondary | ICD-10-CM | POA: Diagnosis not present

## 2017-04-28 DIAGNOSIS — R110 Nausea: Secondary | ICD-10-CM | POA: Diagnosis not present

## 2017-04-28 DIAGNOSIS — D509 Iron deficiency anemia, unspecified: Secondary | ICD-10-CM | POA: Diagnosis not present

## 2017-04-28 DIAGNOSIS — Z9181 History of falling: Secondary | ICD-10-CM | POA: Diagnosis not present

## 2017-04-28 DIAGNOSIS — D631 Anemia in chronic kidney disease: Secondary | ICD-10-CM | POA: Diagnosis not present

## 2017-04-28 DIAGNOSIS — R279 Unspecified lack of coordination: Secondary | ICD-10-CM | POA: Diagnosis not present

## 2017-04-28 DIAGNOSIS — N2581 Secondary hyperparathyroidism of renal origin: Secondary | ICD-10-CM | POA: Diagnosis not present

## 2017-04-29 DIAGNOSIS — Z9181 History of falling: Secondary | ICD-10-CM | POA: Diagnosis not present

## 2017-04-29 DIAGNOSIS — R279 Unspecified lack of coordination: Secondary | ICD-10-CM | POA: Diagnosis not present

## 2017-04-29 DIAGNOSIS — M6281 Muscle weakness (generalized): Secondary | ICD-10-CM | POA: Diagnosis not present

## 2017-04-30 ENCOUNTER — Other Ambulatory Visit (HOSPITAL_COMMUNITY)
Admission: RE | Admit: 2017-04-30 | Discharge: 2017-04-30 | Disposition: A | Discharge status: Home / Self Care | Payer: Medicare Other | Source: Skilled Nursing Facility | Attending: Internal Medicine | Admitting: Internal Medicine

## 2017-04-30 ENCOUNTER — Telehealth: Payer: Self-pay

## 2017-04-30 ENCOUNTER — Encounter: Payer: Self-pay | Admitting: Internal Medicine

## 2017-04-30 ENCOUNTER — Non-Acute Institutional Stay (SKILLED_NURSING_FACILITY): Payer: Medicare Other | Admitting: Internal Medicine

## 2017-04-30 DIAGNOSIS — Z9181 History of falling: Secondary | ICD-10-CM | POA: Diagnosis not present

## 2017-04-30 DIAGNOSIS — Z7901 Long term (current) use of anticoagulants: Secondary | ICD-10-CM

## 2017-04-30 DIAGNOSIS — M6281 Muscle weakness (generalized): Secondary | ICD-10-CM | POA: Diagnosis not present

## 2017-04-30 DIAGNOSIS — D649 Anemia, unspecified: Secondary | ICD-10-CM

## 2017-04-30 DIAGNOSIS — R279 Unspecified lack of coordination: Secondary | ICD-10-CM | POA: Diagnosis not present

## 2017-04-30 DIAGNOSIS — D509 Iron deficiency anemia, unspecified: Secondary | ICD-10-CM | POA: Diagnosis not present

## 2017-04-30 DIAGNOSIS — K91840 Postprocedural hemorrhage and hematoma of a digestive system organ or structure following a digestive system procedure: Secondary | ICD-10-CM

## 2017-04-30 DIAGNOSIS — N2581 Secondary hyperparathyroidism of renal origin: Secondary | ICD-10-CM | POA: Diagnosis not present

## 2017-04-30 DIAGNOSIS — Z992 Dependence on renal dialysis: Secondary | ICD-10-CM | POA: Diagnosis not present

## 2017-04-30 DIAGNOSIS — R110 Nausea: Secondary | ICD-10-CM | POA: Diagnosis not present

## 2017-04-30 DIAGNOSIS — D631 Anemia in chronic kidney disease: Secondary | ICD-10-CM | POA: Diagnosis not present

## 2017-04-30 DIAGNOSIS — N186 End stage renal disease: Secondary | ICD-10-CM | POA: Diagnosis not present

## 2017-04-30 LAB — PROTIME-INR
INR: 1.09
Prothrombin Time: 14.1 seconds (ref 11.4–15.2)

## 2017-04-30 NOTE — Telephone Encounter (Signed)
Reviewed and no additional comments.

## 2017-04-30 NOTE — Progress Notes (Signed)
Location:   [facility] Room Number: 112/D Place of Service:  SNF (31) Provider:  Glennon Hamilton, MD  Patient Care Team: [name], MD as PCP - General (Internal Medicine) Cassandria Anger, MD as Consulting Physician (Endocrinology) Fran Lowes, MD as Consulting Physician (Nephrology) Gala Romney Cristopher Estimable, MD as Consulting Physician (Gastroenterology)  Extended Emergency Contact Information Primary Emergency Contact: Radene Gunning Address: DSS  [place] of [place] Phone: [phone] Work Phone: [phone] Relation: Legal Guardian Secondary Emergency Contact: Noon,Richard Address: [address]          [place], [place] [address] [place] Phone: [phone] Relation: Brother  Code Status:  Full Code Goals of care: Advanced Directive information Advanced Directives 04/30/2017  Does Patient Have a Medical Advance Directive? Yes  Type of Advance Directive (No Data)  Does patient want to make changes to medical advance directive? No - Patient declined  Copy of Lydia in Chart? -  Would patient like information on creating a medical advance directive? No - Patient declined  Pre-existing out of facility DNR order (yellow form or pink MOST form) -     Chief Complaint  Patient presents with  . Acute Visit    F/UColonoscopy    HPI:  Pt is a 71 y.o. male seen today for an acute visit for Follow-up visit for recent colonoscopy-the colonoscopy did show a mass in the right colon biopsy he also had multiple large colon polyps removed and clips placed secondary to bleeding.  He does have a history of  chronic anemia with a history of end-stage renal disease-does receive dialysis 3 times a week.  He is on Coumadin with a history of DVT-not on Lovenox with recommendation by GI does not start Lovenox as a bridge-INR is subtherapeutic area 1.0 9 GI has recommended no aggressive increase  in anticoagulation secondary to history of bleeding after the procedure.  We appreciate GIs input on this.  According to nursing and patient still has some residual bleeding from procedure this has improved.  He has returned from dialysis today and has no complaints-blood pressure is somewhat low with systolic in the 13K but this has not been greatly unusual at times he does have this.  He's not really complaining of any dizziness or increased weakness is only complaint is he is hungry.     Past Medical History:  Diagnosis Date  . Abnormal CT scan, kidney 10/06/2011  . Acute pyelonephritis 10/07/2011  . Anemia    normocytic  . Anxiety    mental retardation  . Bladder wall thickening 10/06/2011  . BPH (benign prostatic hypertrophy)   . Diabetes mellitus   . Edema     history of lower extremity edema  . Heme positive stool   . Hydronephrosis   . Hyperkalemia   . Hyperlipidemia   . Hypernatremia   . Hypertension   . Hypothyroidism   . Impaired speech   . 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 MR (mental retardation)   . Obstructive uropathy   . 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 Poor historian poor historian  . Protein calorie malnutrition ([place])   . Pyelonephritis   . Renal failure (ARF), acute on chronic (HCC)   . Renal insufficiency    chronic history  . Smoking   . Uremia   . Urinary retention   . UTI (lower urinary tract infection) 10/06/2011  . UTI (lower urinary tract infection)  Past Surgical History:  Procedure Laterality Date  . AV FISTULA PLACEMENT Left 07/06/2015   Procedure:  INSERTION LEFT ARM ARTERIOVENOUS GORTEX GRAFT;  Surgeon: Angelia Mould, MD;  Location: [facility];  Service: Vascular;  Laterality: Left;  . AV FISTULA PLACEMENT Right 02/26/2016   Procedure: ARTERIOVENOUS (AV) FISTULA CREATION ;  Surgeon: Angelia Mould, MD;  Location: [facility];  Service: Vascular;  Laterality: Right;  . [place] REMOVAL Left 10/09/2015   Procedure:  REMOVAL OF ARTERIOVENOUS GORETEX GRAFT ([place]) Evacuation of Lymphocele, Vein Patch angioplasty of brachial artery.;  Surgeon: Angelia Mould, MD;  Location: [facility];  Service: Vascular;  Laterality: Left;  . BASCILIC VEIN TRANSPOSITION Right 02/26/2016   Procedure: Right BASCILIC VEIN TRANSPOSITION;  Surgeon: Angelia Mould, MD;  Location: [facility];  Service: Vascular;  Laterality: Right;  . CIRCUMCISION N/A 01/04/2014   Procedure: CIRCUMCISION ADULT (procedure #1);  Surgeon: Marissa Nestle, MD;  [facility];  Service: Urology;  Laterality: N/A;  . CYSTOSCOPY W/ RETROGRADES Bilateral 06/29/2015   Procedure: CYSTOSCOPY, DILATION OF URETHRAL STRICTURE WITH BILATERAL RETROGRADE PYELOGRAM,SUPRAPUBIC TUBE CHANGE;  Surgeon: Festus Aloe, MD;  [facility];  Service: Urology;  Laterality: Bilateral;  . CYSTOSCOPY WITH URETHRAL DILATATION N/A 12/29/2013   Procedure: CYSTOSCOPY WITH URETHRAL DILATATION;  Surgeon: Marissa Nestle, MD;  [facility];  Service: Urology;  Laterality: N/A;  . ORIF FEMUR FRACTURE Right 11/22/2016   Procedure: OPEN REDUCTION INTERNAL FIXATION (ORIF) DISTAL FEMUR FRACTURE;  Surgeon: Rod Can, MD;  Location: [facility];  Service: Orthopedics;  Laterality: Right;  . PERIPHERAL VASCULAR CATHETERIZATION N/A 10/08/2015   Procedure: A/V Shuntogram;  Surgeon: Angelia Mould, MD;  Location: [facility];  Service: Cardiovascular;  Laterality: N/A;  . TRANSURETHRAL RESECTION OF PROSTATE N/A 01/04/2014   Procedure: TRANSURETHRAL RESECTION OF THE PROSTATE (TURP) (procedure #2);  Surgeon: Marissa Nestle, MD;  [facility];  Service: Urology;  Laterality: N/A;    No Known Allergies  Outpatient Encounter Prescriptions as of 04/30/2017  Medication Sig  . Amino Acids-Protein Hydrolys (FEEDING SUPPLEMENT, PRO-STAT 64,) LIQD Take 30 mLs by mouth 2 (two) times daily between meals.  Marland Kitchen atorvastatin (LIPITOR) 40 MG tablet Take 40 mg by mouth every evening.    Marland Kitchen epoetin alfa (EPOGEN,PROCRIT) 4000 UNIT/ML injection Inject 4,000 Units into the vein every 14 (fourteen) days.  Marland Kitchen levothyroxine (SYNTHROID, LEVOTHROID) 75 MCG tablet Take 75 mcg by mouth daily before breakfast.   . Multiple Vitamin (MULTIVITAMIN WITH MINERALS) TABS tablet Take 1 tablet by mouth every evening.   . Neo-Bacit-Poly-Lidocaine (FIRST AID PLUS LIDOCAINE EX) Apply 1 application topically 3 (three) times a week. Prior to dialysis  . Nutritional Supplements (NEPRO) LIQD Take 1 can by mouth twice a day  . omeprazole (PRILOSEC) 40 MG capsule Take 40 mg by mouth every evening.  . tamsulosin (FLOMAX) 0.4 MG CAPS capsule Take 0.4 mg by mouth daily at 8 pm.   . torsemide (DEMADEX) 10 MG tablet Take 5 tablets (50 mg total) by mouth daily.  Marland Kitchen warfarin (COUMADIN) 4 MG tablet Take 4 mg by mouth daily at 6 PM.    No facility-administered encounter medications on file as of 04/30/2017.     Review of Systems   Patient is somewhat of a poor historian this is limited by nursing as well.  In general no complaints of fever chills.  Skin does not complaining of rashes or itching.  Head ears eyes nose mouth and throat does not  complain of difficulty swallowing sore throat or visual changes.  Respiratory does not complain of shortness of breath or cough.  Cardiac denies chest pain.  I again extensive history as noted above does not complaining of any abdominal discomfort apparently still has some residual bleeding status post procedure.  GU does not complain of dysuria he is on dialysis end-stage renal disease.  Muscle skeletal is not complaining of joint pain or increased weakness from baseline  Neurologic is not complaining of this crease dizziness or headache or numbness.   Psych does not complaining of overt anxiety or depression does have a history of cognitive deficits mental retardation  Immunization History  Administered Date(s) Administered  . Influenza,inj,Quad PF,36+ Mos  12/26/2013, 07/09/2015  . PPD Test 03/07/2015  . Pneumococcal Polysaccharide-23 03/04/2015   Pertinent  Health Maintenance Due  Topic Date Due  . FOOT EXAM  08/23/1956  . OPHTHALMOLOGY EXAM  08/23/1956  . URINE MICROALBUMIN  08/23/1956  . INFLUENZA VACCINE  09/26/2017 (Originally 05/27/2017)  . PNA vac Low Risk Adult (2 of 2 - PCV13) 09/26/2017 (Originally 03/03/2016)  . HEMOGLOBIN A1C  08/18/2017  . COLONOSCOPY  04/28/2027   No flowsheet data found. Functional Status Survey:     She is afebrile pulse is 60 respirations 18 blood pressure 90/58 Physical Exam In general this is a very pleasant middle-age male in no distress lying comfortably in bed.  His skin is warm and dry.  Oropharynx clear mucous membranes moist.  Eyes visual acuity appears grossly intact.  Chest is clear to auscultation with somewhat shallow air entry there is no labored breathing.  Heart is regular rate and rhythm without murmur gallop or rub he does have a bruit appreciated right upper arm.  He has slight lower extremity edema.  Abdomen soft nontender positive bowel sounds.   Muscle skeletal does move all extremities 4 at baseline largely ambulates in a wheelchair .  Has baseline upper extremity strength.  Neurologic is grossly intact to speech is clear no lateralizing findings.  Psych he is pleasant and appropriate with mild mental deficits does well with supportive care   Labs reviewed:  Recent Labs  11/24/16 0339 11/25/16 0428 11/26/16 0613  01/04/17 1500 03/03/17 0802 03/04/17 0700  NA 136 136 136  < > 143 137 138  K 5.6* 4.8 4.5  < > 3.4* 4.6 4.1  CL 100* 97* 97*  < > 106 99* 99*  CO2 23 25 24   < > 27 24 26   GLUCOSE 91 112* 88  < > 197* 91 105*  BUN 55* 41* 64*  < > 33* 114* 65*  CREATININE 7.68* 6.47* 8.80*  < > 3.64* 6.15* 4.52*  CALCIUM 8.3* 8.7* 8.3*  < > 8.7* 8.8* 9.2  PHOS 6.9* 6.7* 8.7*  --   --   --   --   < > = values in this interval not  displayed.  Recent Labs  11/20/16 1446  11/25/16 0428 11/26/16 0613 11/28/16 1400  AST 27  --   --   --  27  ALT 33  --   --   --  <5*  ALKPHOS 77  --   --   --  68  BILITOT 0.4  --   --   --  0.9  PROT 7.1  --   --   --  7.6  ALBUMIN 3.7  < > 2.6* 2.3* 2.9*  < > = values in this interval not displayed.  Recent Labs  01/19/17 0700 02/02/17 0700 03/03/17 0802  WBC 6.0 5.5 5.3  NEUTROABS 3.0 2.9 2.7  HGB 9.2* 9.6* 11.2*  HCT 28.2* 30.4* 34.2*  MCV 103.3* 105.6* 103.6*  PLT 253 192 187   Lab Results  Component Value Date   TSH 3.004 01/16/2017   Lab Results  Component Value Date   HGBA1C 4.8 02/16/2017   Lab Results  Component Value Date   CHOL 98 01/16/2017   HDL 33 (L) 01/16/2017   LDLCALC 50 01/16/2017   TRIG 77 01/16/2017   CHOLHDL 3.0 01/16/2017    Significant Diagnostic Results in last 30 days:  No results found.  Assessment/Plan . #1-history of colonoscopy with mass noted-he is followed closely by GI-still has some residual bleeding which apparently is to be expected-he will need his hemoglobin rechecked his blood pressure is somewhat low but this is not totally unusual he appears to be stable this time but will have to be watched again will order updated lab work most importantly CBC to see where we stand.  #2-anticoagulation management with history of DVT right lower extremity being somewhat conservative here secondary to history of bleeding GI has recommended continuing current Coumadin dose and tlet INR gradually become therapeutic with no Lovenox or increasing Coumadin-appreciate GIs input will update an INR tomorrow as well-   [id number]

## 2017-04-30 NOTE — Telephone Encounter (Signed)
Tried to call the [facility] phone rang several times.  Routing to [employer], please see note from [place].

## 2017-04-30 NOTE — Telephone Encounter (Signed)
Would prefer him not to be bridged because of risk of post polypectomy bleeding. I would continue his standard dose of Coumadin and let his INR become therapeutic on its own and may take a little time, however.

## 2017-04-30 NOTE — Telephone Encounter (Signed)
Zachary [name] from the [facility] called today. She said their PA was in to see the pt and his INR was 1.09 and with his history of DVT, they were wanting to know if they should start him back on his lovenox injections. I spoke with Magda Paganini, she said she has read the procedure notes and it looks like multiple bx's were done and one was a mass and there may have been a concern for uncontrolled bleeding if the pt was on the lovenox. RMR specifically stated in the discharge instructions, no lovenox. Zachary [name] informed the PA of the [facility] and he said he understood this and would not restart the lovenox. They are going to adjust his coumadin. Routing to RMR for review.

## 2017-05-01 ENCOUNTER — Inpatient Hospital Stay (HOSPITAL_COMMUNITY)
Admission: EM | Admit: 2017-05-01 | Discharge: 2017-05-15 | DRG: 907 | Disposition: A | Discharge status: Skilled Nursing Facility | Payer: Medicare Other | Attending: Internal Medicine | Admitting: Internal Medicine

## 2017-05-01 ENCOUNTER — Other Ambulatory Visit (HOSPITAL_COMMUNITY)
Admission: RE | Admit: 2017-05-01 | Discharge: 2017-05-01 | Disposition: A | Discharge status: Home / Self Care | Payer: Medicare Other | Source: Skilled Nursing Facility | Attending: Internal Medicine | Admitting: Internal Medicine

## 2017-05-01 ENCOUNTER — Encounter (HOSPITAL_COMMUNITY): Payer: Self-pay | Admitting: Emergency Medicine

## 2017-05-01 ENCOUNTER — Other Ambulatory Visit: Payer: Self-pay

## 2017-05-01 ENCOUNTER — Observation Stay (HOSPITAL_COMMUNITY): Payer: Medicare Other

## 2017-05-01 DIAGNOSIS — G8918 Other acute postprocedural pain: Secondary | ICD-10-CM

## 2017-05-01 DIAGNOSIS — M7989 Other specified soft tissue disorders: Secondary | ICD-10-CM | POA: Diagnosis not present

## 2017-05-01 DIAGNOSIS — I82401 Acute embolism and thrombosis of unspecified deep veins of right lower extremity: Secondary | ICD-10-CM

## 2017-05-01 DIAGNOSIS — N186 End stage renal disease: Secondary | ICD-10-CM

## 2017-05-01 DIAGNOSIS — D631 Anemia in chronic kidney disease: Secondary | ICD-10-CM | POA: Diagnosis not present

## 2017-05-01 DIAGNOSIS — D122 Benign neoplasm of ascending colon: Secondary | ICD-10-CM | POA: Diagnosis present

## 2017-05-01 DIAGNOSIS — K91840 Postprocedural hemorrhage and hematoma of a digestive system organ or structure following a digestive system procedure: Principal | ICD-10-CM | POA: Diagnosis present

## 2017-05-01 DIAGNOSIS — K639 Disease of intestine, unspecified: Secondary | ICD-10-CM

## 2017-05-01 DIAGNOSIS — R509 Fever, unspecified: Secondary | ICD-10-CM | POA: Diagnosis not present

## 2017-05-01 DIAGNOSIS — K567 Ileus, unspecified: Secondary | ICD-10-CM | POA: Diagnosis not present

## 2017-05-01 DIAGNOSIS — Z992 Dependence on renal dialysis: Secondary | ICD-10-CM

## 2017-05-01 DIAGNOSIS — D124 Benign neoplasm of descending colon: Secondary | ICD-10-CM | POA: Diagnosis present

## 2017-05-01 DIAGNOSIS — F419 Anxiety disorder, unspecified: Secondary | ICD-10-CM | POA: Diagnosis present

## 2017-05-01 DIAGNOSIS — N179 Acute kidney failure, unspecified: Secondary | ICD-10-CM | POA: Diagnosis not present

## 2017-05-01 DIAGNOSIS — R4189 Other symptoms and signs involving cognitive functions and awareness: Secondary | ICD-10-CM | POA: Diagnosis present

## 2017-05-01 DIAGNOSIS — N189 Chronic kidney disease, unspecified: Secondary | ICD-10-CM

## 2017-05-01 DIAGNOSIS — K922 Gastrointestinal hemorrhage, unspecified: Secondary | ICD-10-CM | POA: Diagnosis present

## 2017-05-01 DIAGNOSIS — K5730 Diverticulosis of large intestine without perforation or abscess without bleeding: Secondary | ICD-10-CM | POA: Diagnosis present

## 2017-05-01 DIAGNOSIS — E1165 Type 2 diabetes mellitus with hyperglycemia: Secondary | ICD-10-CM | POA: Diagnosis present

## 2017-05-01 DIAGNOSIS — E1122 Type 2 diabetes mellitus with diabetic chronic kidney disease: Secondary | ICD-10-CM | POA: Diagnosis present

## 2017-05-01 DIAGNOSIS — D62 Acute posthemorrhagic anemia: Secondary | ICD-10-CM | POA: Diagnosis present

## 2017-05-01 DIAGNOSIS — F79 Unspecified intellectual disabilities: Secondary | ICD-10-CM

## 2017-05-01 DIAGNOSIS — E784 Other hyperlipidemia: Secondary | ICD-10-CM | POA: Diagnosis not present

## 2017-05-01 DIAGNOSIS — K635 Polyp of colon: Secondary | ICD-10-CM

## 2017-05-01 DIAGNOSIS — Z79899 Other long term (current) drug therapy: Secondary | ICD-10-CM

## 2017-05-01 DIAGNOSIS — Z7901 Long term (current) use of anticoagulants: Secondary | ICD-10-CM

## 2017-05-01 DIAGNOSIS — K6389 Other specified diseases of intestine: Secondary | ICD-10-CM | POA: Diagnosis not present

## 2017-05-01 DIAGNOSIS — A419 Sepsis, unspecified organism: Secondary | ICD-10-CM | POA: Diagnosis not present

## 2017-05-01 DIAGNOSIS — I824Y1 Acute embolism and thrombosis of unspecified deep veins of right proximal lower extremity: Secondary | ICD-10-CM

## 2017-05-01 DIAGNOSIS — N400 Enlarged prostate without lower urinary tract symptoms: Secondary | ICD-10-CM | POA: Diagnosis present

## 2017-05-01 DIAGNOSIS — E785 Hyperlipidemia, unspecified: Secondary | ICD-10-CM | POA: Diagnosis present

## 2017-05-01 DIAGNOSIS — R6250 Unspecified lack of expected normal physiological development in childhood: Secondary | ICD-10-CM | POA: Diagnosis present

## 2017-05-01 DIAGNOSIS — E039 Hypothyroidism, unspecified: Secondary | ICD-10-CM | POA: Diagnosis present

## 2017-05-01 DIAGNOSIS — R4789 Other speech disturbances: Secondary | ICD-10-CM | POA: Diagnosis present

## 2017-05-01 DIAGNOSIS — I10 Essential (primary) hypertension: Secondary | ICD-10-CM | POA: Diagnosis not present

## 2017-05-01 DIAGNOSIS — I120 Hypertensive chronic kidney disease with stage 5 chronic kidney disease or end stage renal disease: Secondary | ICD-10-CM | POA: Diagnosis present

## 2017-05-01 DIAGNOSIS — Z86718 Personal history of other venous thrombosis and embolism: Secondary | ICD-10-CM

## 2017-05-01 DIAGNOSIS — D125 Benign neoplasm of sigmoid colon: Secondary | ICD-10-CM | POA: Diagnosis present

## 2017-05-01 DIAGNOSIS — I82409 Acute embolism and thrombosis of unspecified deep veins of unspecified lower extremity: Secondary | ICD-10-CM

## 2017-05-01 DIAGNOSIS — Y848 Other medical procedures as the cause of abnormal reaction of the patient, or of later complication, without mention of misadventure at the time of the procedure: Secondary | ICD-10-CM | POA: Diagnosis present

## 2017-05-01 DIAGNOSIS — D123 Benign neoplasm of transverse colon: Secondary | ICD-10-CM | POA: Diagnosis present

## 2017-05-01 DIAGNOSIS — K9189 Other postprocedural complications and disorders of digestive system: Secondary | ICD-10-CM | POA: Diagnosis not present

## 2017-05-01 DIAGNOSIS — K921 Melena: Secondary | ICD-10-CM | POA: Diagnosis present

## 2017-05-01 LAB — CBC WITH DIFFERENTIAL/PLATELET
Basophils Absolute: 0 10*3/uL (ref 0.0–0.1)
Basophils Relative: 0 %
Eosinophils Absolute: 0.1 10*3/uL (ref 0.0–0.7)
Eosinophils Relative: 1 %
HCT: 17.5 % — ABNORMAL LOW (ref 39.0–52.0)
Hemoglobin: 5.8 g/dL — CL (ref 13.0–17.0)
Lymphocytes Relative: 24 %
Lymphs Abs: 2 10*3/uL (ref 0.7–4.0)
MCH: 33.9 pg (ref 26.0–34.0)
MCHC: 33.1 g/dL (ref 30.0–36.0)
MCV: 102.3 fL — ABNORMAL HIGH (ref 78.0–100.0)
Monocytes Absolute: 0.7 10*3/uL (ref 0.1–1.0)
Monocytes Relative: 8 %
Neutro Abs: 5.7 10*3/uL (ref 1.7–7.7)
Neutrophils Relative %: 67 %
Platelets: 155 10*3/uL (ref 150–400)
RBC: 1.71 MIL/uL — ABNORMAL LOW (ref 4.22–5.81)
RDW: 15.1 % (ref 11.5–15.5)
WBC: 8.5 10*3/uL (ref 4.0–10.5)

## 2017-05-01 LAB — RETICULOCYTES
RBC.: 1.64 MIL/uL — ABNORMAL LOW (ref 4.22–5.81)
Retic Count, Absolute: 123 10*3/uL (ref 19.0–186.0)
Retic Ct Pct: 7.5 % — ABNORMAL HIGH (ref 0.4–3.1)

## 2017-05-01 LAB — PHOSPHORUS: Phosphorus: 5 mg/dL — ABNORMAL HIGH (ref 2.5–4.6)

## 2017-05-01 LAB — BASIC METABOLIC PANEL
Anion gap: 8 (ref 5–15)
BUN: 51 mg/dL — ABNORMAL HIGH (ref 6–20)
CO2: 28 mmol/L (ref 22–32)
Calcium: 7.9 mg/dL — ABNORMAL LOW (ref 8.9–10.3)
Chloride: 99 mmol/L — ABNORMAL LOW (ref 101–111)
Creatinine, Ser: 5.61 mg/dL — ABNORMAL HIGH (ref 0.61–1.24)
GFR calc Af Amer: 11 mL/min — ABNORMAL LOW (ref >60)
GFR calc non Af Amer: 9 mL/min — ABNORMAL LOW (ref >60)
Glucose, Bld: 136 mg/dL — ABNORMAL HIGH (ref 65–99)
Potassium: 4.4 mmol/L (ref 3.5–5.1)
Sodium: 135 mmol/L (ref 135–145)

## 2017-05-01 LAB — PROTIME-INR
INR: 1.22
Prothrombin Time: 15.5 seconds — ABNORMAL HIGH (ref 11.4–15.2)

## 2017-05-01 LAB — IRON AND TIBC
Iron: 34 ug/dL — ABNORMAL LOW (ref 45–182)
Saturation Ratios: 15 % — ABNORMAL LOW (ref 17.9–39.5)
TIBC: 231 ug/dL — ABNORMAL LOW (ref 250–450)
UIBC: 197 ug/dL

## 2017-05-01 LAB — HEMOGLOBIN AND HEMATOCRIT, BLOOD
HCT: 17 % — ABNORMAL LOW (ref 39.0–52.0)
Hemoglobin: 5.5 g/dL — CL (ref 13.0–17.0)

## 2017-05-01 LAB — CBC
HCT: 26.6 % — ABNORMAL LOW (ref 39.0–52.0)
Hemoglobin: 9 g/dL — ABNORMAL LOW (ref 13.0–17.0)
MCH: 32.1 pg (ref 26.0–34.0)
MCHC: 33.8 g/dL (ref 30.0–36.0)
MCV: 95 fL (ref 78.0–100.0)
Platelets: 140 10*3/uL — ABNORMAL LOW (ref 150–400)
RBC: 2.8 MIL/uL — ABNORMAL LOW (ref 4.22–5.81)
RDW: 18.9 % — ABNORMAL HIGH (ref 11.5–15.5)
WBC: 9.9 10*3/uL (ref 4.0–10.5)

## 2017-05-01 LAB — FOLATE: Folate: 56.7 ng/mL (ref >5.9)

## 2017-05-01 LAB — LACTATE DEHYDROGENASE: LDH: 104 U/L (ref 98–192)

## 2017-05-01 LAB — VITAMIN B12: Vitamin B-12: 533 pg/mL (ref 180–914)

## 2017-05-01 LAB — FERRITIN: Ferritin: 85 ng/mL (ref 24–336)

## 2017-05-01 LAB — CBG MONITORING, ED: Glucose-Capillary: 151 mg/dL — ABNORMAL HIGH (ref 65–99)

## 2017-05-01 LAB — MRSA PCR SCREENING: MRSA by PCR: POSITIVE — AB

## 2017-05-01 LAB — ALBUMIN: Albumin: 2.9 g/dL — ABNORMAL LOW (ref 3.5–5.0)

## 2017-05-01 LAB — PREPARE RBC (CROSSMATCH)

## 2017-05-01 MED ORDER — ONDANSETRON HCL 4 MG/2ML IJ SOLN
4.0000 mg | Freq: Four times a day (QID) | INTRAMUSCULAR | Status: DC | PRN
  Administered 2017-05-04 – 2017-05-10 (×4): 4 mg via INTRAVENOUS
  Filled 2017-05-01 (×4): qty 2

## 2017-05-01 MED ORDER — PRO-STAT 64 PO LIQD
30.0000 mL | Freq: Two times a day (BID) | ORAL | Status: DC
  Administered 2017-05-01 – 2017-05-09 (×12): 30 mL via ORAL
  Filled 2017-05-01 (×14): qty 30

## 2017-05-01 MED ORDER — ACETAMINOPHEN 650 MG RE SUPP: 650.0000 mg | Freq: Four times a day (QID) | RECTAL | Status: DC | PRN

## 2017-05-01 MED ORDER — PANTOPRAZOLE SODIUM 40 MG PO TBEC
80.0000 mg | DELAYED_RELEASE_TABLET | Freq: Every day | ORAL | Status: DC
  Administered 2017-05-01 – 2017-05-15 (×13): 80 mg via ORAL
  Filled 2017-05-01 (×13): qty 2

## 2017-05-01 MED ORDER — LEVOTHYROXINE SODIUM 75 MCG PO TABS
75.0000 ug | ORAL_TABLET | Freq: Every day | ORAL | Status: DC
  Administered 2017-05-02 – 2017-05-15 (×12): 75 ug via ORAL
  Filled 2017-05-01 (×12): qty 1

## 2017-05-01 MED ORDER — ACETAMINOPHEN 325 MG PO TABS
650.0000 mg | ORAL_TABLET | Freq: Four times a day (QID) | ORAL | Status: DC | PRN
  Administered 2017-05-10: 650 mg via ORAL
  Filled 2017-05-01: qty 2

## 2017-05-01 MED ORDER — ATORVASTATIN CALCIUM 40 MG PO TABS
40.0000 mg | ORAL_TABLET | Freq: Every evening | ORAL | Status: DC
  Administered 2017-05-01 – 2017-05-14 (×14): 40 mg via ORAL
  Filled 2017-05-01 (×15): qty 1

## 2017-05-01 MED ORDER — CHLORHEXIDINE GLUCONATE CLOTH 2 % EX PADS
6.0000 | MEDICATED_PAD | Freq: Every day | CUTANEOUS | Status: AC
Start: 2017-05-02 — End: 2017-05-06
  Administered 2017-05-02 – 2017-05-06 (×5): 6 via TOPICAL

## 2017-05-01 MED ORDER — ADULT MULTIVITAMIN W/MINERALS CH
1.0000 | ORAL_TABLET | Freq: Every evening | ORAL | Status: DC
  Administered 2017-05-01 – 2017-05-14 (×14): 1 via ORAL
  Filled 2017-05-01 (×14): qty 1

## 2017-05-01 MED ORDER — TAMSULOSIN HCL 0.4 MG PO CAPS
0.4000 mg | ORAL_CAPSULE | Freq: Every day | ORAL | Status: DC
  Administered 2017-05-01 – 2017-05-14 (×14): 0.4 mg via ORAL
  Filled 2017-05-01 (×14): qty 1

## 2017-05-01 MED ORDER — ONDANSETRON HCL 4 MG PO TABS
4.0000 mg | ORAL_TABLET | Freq: Four times a day (QID) | ORAL | Status: DC | PRN
  Administered 2017-05-05 – 2017-05-06 (×2): 4 mg via ORAL
  Filled 2017-05-01 (×2): qty 1

## 2017-05-01 MED ORDER — MUPIROCIN 2 % EX OINT
1.0000 | TOPICAL_OINTMENT | Freq: Two times a day (BID) | CUTANEOUS | Status: AC
Start: 2017-05-01 — End: 2017-05-06
  Administered 2017-05-01 – 2017-05-05 (×9): 1 via NASAL
  Filled 2017-05-01 (×2): qty 22

## 2017-05-01 MED ORDER — SODIUM CHLORIDE 0.9 % IV SOLN
10.0000 mL/h | Freq: Once | INTRAVENOUS | Status: AC
  Administered 2017-05-01: 10 mL/h via INTRAVENOUS

## 2017-05-01 MED ORDER — TORSEMIDE 20 MG PO TABS
50.0000 mg | ORAL_TABLET | Freq: Every day | ORAL | Status: DC
  Administered 2017-05-01 – 2017-05-15 (×12): 50 mg via ORAL
  Filled 2017-05-01: qty 1
  Filled 2017-05-01 (×2): qty 3
  Filled 2017-05-01: qty 1
  Filled 2017-05-01 (×3): qty 3
  Filled 2017-05-01: qty 1
  Filled 2017-05-01: qty 3
  Filled 2017-05-01: qty 1
  Filled 2017-05-01 (×2): qty 3

## 2017-05-01 MED ORDER — VITAMIN K1 10 MG/ML IJ SOLN
5.0000 mg | Freq: Once | INTRAVENOUS | Status: AC
  Administered 2017-05-01: 5 mg via INTRAVENOUS
  Filled 2017-05-01: qty 0.5

## 2017-05-01 NOTE — Consult Note (Signed)
Reason for Consult: End-stage renal disease Referring Physician: Dr. Shanon Brow Tat  Zachary [name] is an 71 y.o. male.  HPI: He is a patient was history of hypertension, obstructive uropathy, mental retardation and end-stage renal disease on maintenance hemodialysis presently is sent from pain center because of anemia. Patient had colonoscopy on 04/27/2017. During that time he was found to have multiple polyps including tubular villous adenoma and a mass. Patient had biopsy done which showed possible high-grade dysplasia and one of same. Since patient has history of DVT was started back on Coumadin . Because of his mental retardation very difficult to get information but he states that he has some bleeding. Patient denies any difficulty breathing. Since his hemoglobin is very low possibility of GI bleeding was entertained and admitted to the hospital. Patient has received 1 unit of packed red blood cell.  Past Medical History:  Diagnosis Date  . Abnormal CT scan, kidney 10/06/2011  . Acute pyelonephritis 10/07/2011  . Anemia    normocytic  . Anxiety    mental retardation  . Bladder wall thickening 10/06/2011  . BPH (benign prostatic hypertrophy)   . Diabetes mellitus   . Edema     history of lower extremity edema  . Heme positive stool   . Hydronephrosis   . Hyperkalemia   . Hyperlipidemia   . Hypernatremia   . Hypertension   . Hypothyroidism   . Impaired speech   . 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 MR (mental retardation)   . Obstructive uropathy   . 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 Poor historian poor historian  . Protein calorie malnutrition ([place])   . Pyelonephritis   . Renal failure (ARF), acute on chronic (HCC)   . Renal insufficiency    chronic history  . Smoking   . Uremia   . Urinary retention   . UTI (lower urinary tract infection) 10/06/2011  . UTI (lower urinary tract infection)     Past Surgical History:  Procedure Laterality Date  . AV FISTULA PLACEMENT  Left 07/06/2015   Procedure:  INSERTION LEFT ARM ARTERIOVENOUS GORTEX GRAFT;  Surgeon: Angelia Mould, MD;  Location: [facility];  Service: Vascular;  Laterality: Left;  . AV FISTULA PLACEMENT Right 02/26/2016   Procedure: ARTERIOVENOUS (AV) FISTULA CREATION ;  Surgeon: Angelia Mould, MD;  Location: [facility];  Service: Vascular;  Laterality: Right;  . [place] REMOVAL Left 10/09/2015   Procedure: REMOVAL OF ARTERIOVENOUS GORETEX GRAFT ([place]) Evacuation of Lymphocele, Vein Patch angioplasty of brachial artery.;  Surgeon: Angelia Mould, MD;  Location: [facility];  Service: Vascular;  Laterality: Left;  . BASCILIC VEIN TRANSPOSITION Right 02/26/2016   Procedure: Right BASCILIC VEIN TRANSPOSITION;  Surgeon: Angelia Mould, MD;  Location: [facility];  Service: Vascular;  Laterality: Right;  . CIRCUMCISION N/A 01/04/2014   Procedure: CIRCUMCISION ADULT (procedure #1);  Surgeon: Marissa Nestle, MD;  [facility];  Service: Urology;  Laterality: N/A;  . CYSTOSCOPY W/ RETROGRADES Bilateral 06/29/2015   Procedure: CYSTOSCOPY, DILATION OF URETHRAL STRICTURE WITH BILATERAL RETROGRADE PYELOGRAM,SUPRAPUBIC TUBE CHANGE;  Surgeon: Festus Aloe, MD;  [facility];  Service: Urology;  Laterality: Bilateral;  . CYSTOSCOPY WITH URETHRAL DILATATION N/A 12/29/2013   Procedure: CYSTOSCOPY WITH URETHRAL DILATATION;  Surgeon: Marissa Nestle, MD;  [facility];  Service: Urology;  Laterality: N/A;  . ORIF FEMUR FRACTURE Right 11/22/2016   Procedure: OPEN REDUCTION INTERNAL FIXATION (ORIF) DISTAL FEMUR FRACTURE;  Surgeon: Rod Can, MD;  Location:  [facility];  Service: Orthopedics;  Laterality: Right;  . PERIPHERAL VASCULAR CATHETERIZATION N/A 10/08/2015   Procedure: A/V Shuntogram;  Surgeon: Angelia Mould, MD;  Location: [facility];  Service: Cardiovascular;  Laterality: N/A;  . TRANSURETHRAL RESECTION OF PROSTATE N/A 01/04/2014   Procedure: TRANSURETHRAL RESECTION OF THE PROSTATE (TURP)  (procedure #2);  Surgeon: Marissa Nestle, MD;  [facility];  Service: Urology;  Laterality: N/A;    Family History  Problem Relation Age of Onset  . Cancer Mother   . Colon cancer Neg Hx     Social History:  reports that he has never smoked. He has never used smokeless tobacco. He reports that he does not drink alcohol or use drugs.  Allergies: No Known Allergies  Medications: I have reviewed the patient's current medications.  Results for orders placed or performed during the hospital encounter of 05/01/17 (from the past 48 hour(s))  Type and screen [facility]     Status: None (Preliminary result)   Collection Time: 05/01/17  8:12 AM  Result Value Ref Range   ABO/RH(D) B POS    Antibody Screen NEG    Sample Expiration 05/04/2017    Unit Number [id number]    Blood Component Type RED CELLS,LR    Unit division 00    Status of Unit ISSUED    Transfusion Status OK TO TRANSFUSE    Crossmatch Result Compatible    Unit Number [id number]    Blood Component Type RED CELLS,LR    Unit division 00    Status of Unit ALLOCATED    Transfusion Status OK TO TRANSFUSE    Crossmatch Result Compatible    Unit Number [id number]    Blood Component Type RED CELLS,LR    Unit division 00    Status of Unit ALLOCATED    Transfusion Status OK TO TRANSFUSE    Crossmatch Result Compatible   Reticulocytes     Status: Abnormal   Collection Time: 05/01/17  8:12 AM  Result Value Ref Range   Retic Ct Pct 7.5 (H) 0.4 - 3.1 %   RBC. 1.64 (L) 4.22 - 5.81 MIL/uL   Retic Count, Absolute 123.0 19.0 - 186.0 K/uL  Lactate dehydrogenase     Status: None   Collection Time: 05/01/17  8:12 AM  Result Value Ref Range   LDH 104 98 - 192 U/L  Hemoglobin and hematocrit, blood     Status: Abnormal   Collection Time: 05/01/17  8:12 AM  Result Value Ref Range   Hemoglobin 5.5 (LL) 13.0 - 17.0 g/dL    Comment: REPEATED TO VERIFY CRITICAL RESULT CALLED TO, READ BACK BY AND VERIFIED  WITH: EDWARDS,C AT 0835 BY HFLYNT 05/01/17    HCT 17.0 (L) 39.0 - 52.0 %  POC CBG, ED     Status: Abnormal   Collection Time: 05/01/17  8:20 AM  Result Value Ref Range   Glucose-Capillary 151 (H) 65 - 99 mg/dL  Prepare RBC     Status: None   Collection Time: 05/01/17  9:00 AM  Result Value Ref Range   Order Confirmation ORDER PROCESSED BY BLOOD BANK     No results found.  Review of Systems  Constitutional: Negative for malaise/fatigue.  Respiratory: Negative for shortness of breath.   Cardiovascular: Negative for orthopnea.  Gastrointestinal: Negative for abdominal pain, nausea and vomiting.  Neurological: Negative for weakness.   Blood pressure 111/70, pulse 90, temperature 98.2 F (36.8 C), temperature source Oral, resp. rate 11, height  6' (1.829 m), weight 89 kg (196 lb 3.4 oz), SpO2 100 %. Physical Exam  Constitutional: No distress.  Eyes: No scleral icterus.  Neck: No JVD present.  Cardiovascular: Normal rate and regular rhythm.   Respiratory: No respiratory distress. He has no wheezes. He has no rales.  GI: He exhibits no distension. There is no tenderness.  Musculoskeletal: He exhibits no edema.  Neurological: He is alert.    Assessment/Plan: Problem #1 GI bleeding: Presently patient is getting blood transfusion. Patient is asymptomatic. Problem #2 end-stage renal disease: Potassium is normal and denies any nausea or vomiting. Patient was dialyzed yesterday which is his regular schedule. Problem #3 hypertension: His blood pressure is reasonably controlled Problem #4 history of DVT Problem #5. Mental retardation Problem #6 Bone and mineral disorder: His calcium is range. Plan: Agree with blood transfusion 2] we'll check his hemoglobin after 2 units of blood transfusion. If his hemoglobin is above 7 possibly will transfuse him tomorrow with dialysis. 3] we'll check his CBC and renal panel in the morning 4] we'll make arrangements for patient to get dialysis  tomorrow.  Zachary [name] S 05/01/2017, 12:25 PM

## 2017-05-01 NOTE — ED Notes (Signed)
SS called to speak with guardian. No answer, message left.

## 2017-05-01 NOTE — ED Provider Notes (Signed)
[facility] DEPT Provider Note   CSN: [id number] Arrival date & time: 05/01/17  6295     History   Chief Complaint Chief Complaint  Patient presents with  . Abnormal Lab    low hgb   L5 caveat: Development developmental delay  HPI Zachary [name] is a 71 y.o. male.  HPI Patient is a 71 year old male with end-stage renal disease who dialyzes on Tuesdays Thursdays and Saturdays.  He reports he has normal dialysis yesterday.  He was sent today from the nursing home for a noted hemoglobin of 5.  He recently underwent endoscopy and colonoscopy on 04/27/2017 which demonstrated an ascending colonic mass and he had several polyps removed.  He is on Coumadin for history of acute DVT.  He was not bridged with Lovenox after the procedure and instead they're letting his INR trend up.   Past Medical History:  Diagnosis Date  . Abnormal CT scan, kidney 10/06/2011  . Acute pyelonephritis 10/07/2011  . Anemia    normocytic  . Anxiety    mental retardation  . Bladder wall thickening 10/06/2011  . BPH (benign prostatic hypertrophy)   . Diabetes mellitus   . Edema     history of lower extremity edema  . Heme positive stool   . Hydronephrosis   . Hyperkalemia   . Hyperlipidemia   . Hypernatremia   . Hypertension   . Hypothyroidism   . Impaired speech   . 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 MR (mental retardation)   . Obstructive uropathy   . 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 Poor historian poor historian  . Protein calorie malnutrition ([place])   . Pyelonephritis   . Renal failure (ARF), acute on chronic (HCC)   . Renal insufficiency    chronic history  . Smoking   . Uremia   . Urinary retention   . UTI (lower urinary tract infection) 10/06/2011  . UTI (lower urinary tract infection)     Patient Active Problem List   Diagnosis Date Noted  . Chronic anticoagulation 02/18/2017  . Anticoagulation management encounter 12/29/2016  . DVT (deep venous thrombosis) ([place])  12/25/2016  . Postoperative anemia due to acute blood loss   . Closed comminuted intra-articular fracture of distal femur, right, initial encounter ([place]) 11/22/2016  . Right femoral fracture ([place]) 11/20/2016  . Hydronephrosis, bilateral 03/10/2016  . End stage renal disease ([place]) 10/08/2015  . 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 Heme positive stool   . Abnormal CT scan, esophagus   . Protein-calorie malnutrition, severe ([place]) 07/10/2015  . Essential hypertension 07/08/2015  . Hyperlipidemia 07/08/2015  . Obstructive uropathy 04/17/2015  . Bladder neck contracture   . Anemia, chronic disease 04/16/2015  . Benign prostatic hyperplasia with urinary retention 03/03/2015  . Mental retardation 10/10/2011  . Acute pyelonephritis 10/07/2011  . 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 Bladder wall thickening 10/06/2011  . Iron deficiency anemia 10/02/2011  . Diabetes mellitus ([place]) 10/01/2011  . FX CLOSED FIBULA NOS 02/10/2008    Past Surgical History:  Procedure Laterality Date  . AV FISTULA PLACEMENT Left 07/06/2015   Procedure:  INSERTION LEFT ARM ARTERIOVENOUS GORTEX GRAFT;  Surgeon: Angelia Mould, MD;  Location: [place];  Service: Vascular;  Laterality: Left;  . AV FISTULA PLACEMENT Right 02/26/2016   Procedure: ARTERIOVENOUS (AV) FISTULA CREATION ;  Surgeon: Angelia Mould, MD;  Location: [place];  Service: Vascular;  Laterality: Right;  . [place] REMOVAL Left 10/09/2015   Procedure: REMOVAL OF ARTERIOVENOUS  GORETEX GRAFT ([place]) Evacuation of Lymphocele, Vein Patch angioplasty of brachial artery.;  Surgeon: Angelia Mould, MD;  Location: [place];  Service: Vascular;  Laterality: Left;  . BASCILIC VEIN TRANSPOSITION Right 02/26/2016   Procedure: Right BASCILIC VEIN TRANSPOSITION;  Surgeon: Angelia Mould, MD;  Location: [place];  Service: Vascular;  Laterality: Right;  . CIRCUMCISION N/A 01/04/2014   Procedure: CIRCUMCISION ADULT (procedure #1);  Surgeon:  Marissa Nestle, MD;  Location: [facility];  Service: Urology;  Laterality: N/A;  . CYSTOSCOPY W/ RETROGRADES Bilateral 06/29/2015   Procedure: CYSTOSCOPY, DILATION OF URETHRAL STRICTURE WITH BILATERAL RETROGRADE PYELOGRAM,SUPRAPUBIC TUBE CHANGE;  Surgeon: Festus Aloe, MD;  Location: [facility];  Service: Urology;  Laterality: Bilateral;  . CYSTOSCOPY WITH URETHRAL DILATATION N/A 12/29/2013   Procedure: CYSTOSCOPY WITH URETHRAL DILATATION;  Surgeon: Marissa Nestle, MD;  Location: [facility];  Service: Urology;  Laterality: N/A;  . ORIF FEMUR FRACTURE Right 11/22/2016   Procedure: OPEN REDUCTION INTERNAL FIXATION (ORIF) DISTAL FEMUR FRACTURE;  Surgeon: Rod Can, MD;  Location: [facility];  Service: Orthopedics;  Laterality: Right;  . PERIPHERAL VASCULAR CATHETERIZATION N/A 10/08/2015   Procedure: A/V Shuntogram;  Surgeon: Angelia Mould, MD;  Location: [facility];  Service: Cardiovascular;  Laterality: N/A;  . TRANSURETHRAL RESECTION OF PROSTATE N/A 01/04/2014   Procedure: TRANSURETHRAL RESECTION OF THE PROSTATE (TURP) (procedure #2);  Surgeon: Marissa Nestle, MD;  Location: [facility];  Service: Urology;  Laterality: N/A;       Home Medications    Prior to Admission medications   Medication Sig Start Date End Date Taking? Authorizing Provider  Amino Acids-Protein Hydrolys (FEEDING SUPPLEMENT, PRO-STAT 64,) LIQD Take 30 mLs by mouth 2 (two) times daily between meals.    [provider]  atorvastatin (LIPITOR) 40 MG tablet Take 40 mg by mouth every evening.    [provider]  epoetin alfa (EPOGEN,PROCRIT) 4000 UNIT/ML injection Inject 4,000 Units into the vein every 14 (fourteen) days.    [provider]  levothyroxine (SYNTHROID, LEVOTHROID) 75 MCG tablet Take 75 mcg by mouth daily before breakfast.     [provider]  Multiple Vitamin (MULTIVITAMIN WITH MINERALS) TABS tablet Take 1 tablet by mouth every evening.     [provider]    Neo-Bacit-Poly-Lidocaine (FIRST AID PLUS LIDOCAINE EX) Apply 1 application topically 3 (three) times a week. Prior to dialysis    [provider]  Nutritional Supplements (NEPRO) LIQD Take 1 can by mouth twice a day    [provider]  omeprazole (PRILOSEC) 40 MG capsule Take 40 mg by mouth every evening.    [provider]  tamsulosin (FLOMAX) 0.4 MG CAPS capsule Take 0.4 mg by mouth daily at 8 pm.     [provider]  torsemide (DEMADEX) 10 MG tablet Take 5 tablets (50 mg total) by mouth daily. 04/07/16   [name], MD  warfarin (COUMADIN) 4 MG tablet Take 4 mg by mouth daily at 6 PM.     [provider]    Family History Family History  Problem Relation Age of Onset  . Cancer Mother   . Colon cancer Neg Hx     Social History Social History  Substance Use Topics  . Smoking status: Never Smoker  . Smokeless tobacco: Never Used  . Alcohol use No     Comment: occ. use      Allergies   Patient has no known allergies.   Review of Systems Review  of Systems  Unable to perform ROS: Other     Physical Exam Updated Vital Signs BP 102/60   Pulse 100   Temp 98.7 F (37.1 C) (Oral)   Resp (!) 22   Ht 6' (1.829 m)   Wt 78.5 kg (173 lb)   SpO2 100%   BMI 23.46 kg/m   Physical Exam  Constitutional: He is oriented to person, place, and time. He appears well-developed and well-nourished.  HENT:  Head: Normocephalic and atraumatic.  Eyes: EOM are normal.  Neck: Normal range of motion.  Cardiovascular: Normal rate, regular rhythm and normal heart sounds.   Pulmonary/Chest: Breath sounds normal. No respiratory distress.  Abdominal: Soft. He exhibits no distension. There is no tenderness.  Genitourinary:  Genitourinary Comments: Bright red blood per rectum.  Chaperone present  Musculoskeletal: Normal range of motion.  Neurological: He is alert and oriented to person, place, and time.  Skin: Skin is warm and dry.   Psychiatric: He has a normal mood and affect. Judgment normal.  Nursing note and vitals reviewed.    ED Treatments / Results  Labs (all labs ordered are listed, but only abnormal results are displayed) Labs Reviewed  RETICULOCYTES - Abnormal; Notable for the following:       Result Value   Retic Ct Pct 7.5 (*)    RBC. 1.64 (*)    All other components within normal limits  HEMOGLOBIN AND HEMATOCRIT, BLOOD - Abnormal; Notable for the following:    Hemoglobin 5.5 (*)    HCT 17.0 (*)    All other components within normal limits  CBG MONITORING, ED - Abnormal; Notable for the following:    Glucose-Capillary 151 (*)    All other components within normal limits  LACTATE DEHYDROGENASE  VITAMIN B12  FOLATE  IRON AND TIBC  FERRITIN  TYPE AND SCREEN  PREPARE RBC (CROSSMATCH)   Hemoglobin  Date Value Ref Range Status  05/01/2017 5.5 (LL) 13.0 - 17.0 g/dL Final    Comment:    REPEATED TO VERIFY CRITICAL RESULT CALLED TO, READ BACK BY AND VERIFIED WITH: EDWARDS,C AT 0835 BY HFLYNT 05/01/17   05/01/2017 5.8 (LL) 13.0 - 17.0 g/dL Final    Comment:    RESULT REPEATED AND VERIFIED CRITICAL RESULT CALLED TO, READ BACK BY AND VERIFIED WITH: EANES,C AT 0655 BY HUFFINES,S ON 05/01/17.   03/03/2017 11.2 (L) 13.0 - 17.0 g/dL Final  02/02/2017 9.6 (L) 13.0 - 17.0 g/dL Final    EKG  EKG Interpretation  Date/Time:  Friday May 01 2017 07:40:59 EDT Ventricular Rate:  102 PR Interval:    QRS Duration: 90 QT Interval:  352 QTC Calculation: 459 R Axis:   36 Text Interpretation:  Sinus tachycardia Minimal ST elevation, inferior leads No significant change was found Confirmed by Jola Schmidt [phone]) on 05/01/2017 7:50:00 AM       Radiology No results found.  Procedures .Critical Care Performed by: Jola Schmidt Authorized by: Jola Schmidt    Total critical care time: 35 minutes Critical care time was exclusive of separately billable procedures and treating other  patients. Critical care was necessary to treat or prevent imminent or life-threatening deterioration. Critical care was time spent personally by me on the following activities: development of treatment plan with patient and/or surrogate as well as nursing, discussions with consultants, evaluation of patient's response to treatment, examination of patient, obtaining history from patient or surrogate, ordering and performing treatments and interventions, ordering and review of laboratory studies, ordering and review of  radiographic studies, pulse oximetry and re-evaluation of patient's condition.   Medications Ordered in ED Medications  0.9 %  sodium chloride infusion (not administered)     Initial Impression / Assessment and Plan / ED Course  I have reviewed the triage vital signs and the nursing notes.  Pertinent labs & imaging results that were available during my care of the patient were reviewed by me and considered in my medical decision making (see chart for details).     Patient with a hemoglobin of 5.  His last hemoglobin was 11 and May.  He is likely either bleeding from his ascending colonic mass or his recent polyp removal.  This was performed 4 days ago.  Patient will undergo transfusion of blood at this time.  Document blood pressure 97/61 in the emergency department with his highs heart rate being 100.  Hemodynamically stable at this time but we'll need to continue to follow closely.  Type and screen ordered.  Awaiting blood for blood transfusion at this time.  Patient will be admitted to the hospital.  INR today is 1.22.   Final Clinical Impressions(s) / ED Diagnoses   Final diagnoses:  None    New Prescriptions New Prescriptions   No medications on file     Jola Schmidt, MD 05/01/17 [phone]

## 2017-05-01 NOTE — ED Notes (Signed)
CRITICAL VALUE ALERT  Critical Value:  hgb 5.5  Date & Time Notied:  05/01/17 0835  Provider Notified: dr Venora Maples  Orders Received/Actions taken:

## 2017-05-01 NOTE — ED Notes (Signed)
Zachary [name].

## 2017-05-01 NOTE — H&P (Signed)
History and Physical  LONG BRIMAGE HWE:[id number] DOB: Jan 03, 1946 DOA: 05/01/2017   PCP: [name], MD   Patient coming from: Home  Chief Complaint: low Hgb  HPI:  Zachary [name] is a 71 y.o. male with medical history of ESRD on dialysis Tuesday, Thursday, Saturday hyperlipidemia, impaired glucose tolerance, hypothyroidism presented from [facility] due to Hgb 5.8 on routine labs.  Unfortunately, the patient is unable to provide any detailed history secondary to his cognitive impairment/developed mental delay. The patient had a right femoral fracture status post ORIF on 11/22/2016. This was Dictated by a right distal femoral DVT which was detected on 12/24/2016. The patient was started on warfarin at that time. Subsequently, the patient developed Hemoccult-positive stools. He underwent colonoscopy performed by Dr. Gala Romney on 04/27/2017. The colonoscopy revealed multiple sigmoid, descending colon, and transverse colon polyps which were removed. There was also an annular-appearing mass in the ascending colon. 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 2 of the tubular adenomas had high-grade dysplasia. The patient was restarted back on his warfarin after colonoscopy without Lovenox bridge. The patient says that he has been having some loose stools and hematochezia although he is unable to clarify how long secondary to his cognitive impairment. He complains of some dizziness, but states that has improved since arrival to the hospital. He denies any headache, chest pain, shortness breath, nausea, vomiting, diarrhea, abdominal pain.  In the emergency department, the patient was afebrile and hemodynamically stable albeit with soft blood pressures in the 90s to low 100s.  He was saturating 100% on room air. Hemoglobin was repeated and noted to be 5.5 with increased reticulocyte count. BMP was essentially unremarkable except for serum creatinine of 5.61. INR was 1.22. 3 units PRBC  was ordered for transfusion.  Rectal exam showed bright red blood  Assessment/Plan: Acute blood loss anemia -Consult GI -baseline Hgb ~8-9 -04/27/2017 colonoscopy results as discussed above -Discontinue warfarin--given the risks and benefits, I would favor not restarting warfarin after this hospitalization in the setting of GI bleed -Patient has had approximately 4 months of anticoagulation for a provoked lower extremity DVT -Monitor hemoglobin -3 units PRBC ordered by ED -Clear liquid diet  ESRD -Consult nephrology for maintenance dialysis -Patient normally has dialysis on Tuesday, Thursday, Saturday  Hypothyroidism -Continue Synthroid  Hyperlipidemia -Continue statin  BPH -Continue Flomax  Hyperglycemia/impaired glucose tolerance -02/16/2017 hemoglobin A1c 4.8       Past Medical History:  Diagnosis Date  . Abnormal CT scan, kidney 10/06/2011  . Acute pyelonephritis 10/07/2011  . Anemia    normocytic  . Anxiety    mental retardation  . Bladder wall thickening 10/06/2011  . BPH (benign prostatic hypertrophy)   . Diabetes mellitus   . Edema     history of lower extremity edema  . Heme positive stool   . Hydronephrosis   . Hyperkalemia   . Hyperlipidemia   . Hypernatremia   . Hypertension   . Hypothyroidism   . Impaired speech   . 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 MR (mental retardation)   . Obstructive uropathy   . 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 Poor historian poor historian  . Protein calorie malnutrition ([place])   . Pyelonephritis   . Renal failure (ARF), acute on chronic (HCC)   . Renal insufficiency    chronic history  . Smoking   . Uremia   . Urinary retention   . UTI (lower urinary tract infection) 10/06/2011  .  UTI (lower urinary tract infection)    Past Surgical History:  Procedure Laterality Date  . AV FISTULA PLACEMENT Left 07/06/2015   Procedure:  INSERTION LEFT ARM ARTERIOVENOUS GORTEX GRAFT;  Surgeon: Angelia Mould, MD;  Location: [place];  Service: Vascular;  Laterality: Left;  . AV FISTULA PLACEMENT Right 02/26/2016   Procedure: ARTERIOVENOUS (AV) FISTULA CREATION ;  Surgeon: Angelia Mould, MD;  Location: [place];  Service: Vascular;  Laterality: Right;  . [place] REMOVAL Left 10/09/2015   Procedure: REMOVAL OF ARTERIOVENOUS GORETEX GRAFT ([place]) Evacuation of Lymphocele, Vein Patch angioplasty of brachial artery.;  Surgeon: Angelia Mould, MD;  Location: [place];  Service: Vascular;  Laterality: Left;  . BASCILIC VEIN TRANSPOSITION Right 02/26/2016   Procedure: Right BASCILIC VEIN TRANSPOSITION;  Surgeon: Angelia Mould, MD;  Location: [place];  Service: Vascular;  Laterality: Right;  . CIRCUMCISION N/A 01/04/2014   Procedure: CIRCUMCISION ADULT (procedure #1);  Surgeon: Marissa Nestle, MD;  [facility];  Service: Urology;  Laterality: N/A;  . CYSTOSCOPY W/ RETROGRADES Bilateral 06/29/2015   Procedure: CYSTOSCOPY, DILATION OF URETHRAL STRICTURE WITH BILATERAL RETROGRADE PYELOGRAM,SUPRAPUBIC TUBE CHANGE;  Surgeon: Festus Aloe, MD;  [facility];  Service: Urology;  Laterality: Bilateral;  . CYSTOSCOPY WITH URETHRAL DILATATION N/A 12/29/2013   Procedure: CYSTOSCOPY WITH URETHRAL DILATATION;  Surgeon: Marissa Nestle, MD;  [facility];  Service: Urology;  Laterality: N/A;  . ORIF FEMUR FRACTURE Right 11/22/2016   Procedure: OPEN REDUCTION INTERNAL FIXATION (ORIF) DISTAL FEMUR FRACTURE;  Surgeon: Rod Can, MD;  Location: [place];  Service: Orthopedics;  Laterality: Right;  . PERIPHERAL VASCULAR CATHETERIZATION N/A 10/08/2015   Procedure: A/V Shuntogram;  Surgeon: Angelia Mould, MD;  Location: [facility];  Service: Cardiovascular;  Laterality: N/A;  . TRANSURETHRAL RESECTION OF PROSTATE N/A 01/04/2014   Procedure: TRANSURETHRAL RESECTION OF THE PROSTATE (TURP) (procedure #2);  Surgeon: Marissa Nestle, MD;  [facility];  Service: Urology;  Laterality:  N/A;   Social History:  reports that he has never smoked. He has never used smokeless tobacco. He reports that he does not drink alcohol or use drugs.   Family History  Problem Relation Age of Onset  . Cancer Mother   . Colon cancer Neg Hx      No Known Allergies   Prior to Admission medications   Medication Sig Start Date End Date Taking? Authorizing Provider  Amino Acids-Protein Hydrolys (FEEDING SUPPLEMENT, PRO-STAT 64,) LIQD Take 30 mLs by mouth 2 (two) times daily between meals.   Yes [provider]  atorvastatin (LIPITOR) 40 MG tablet Take 40 mg by mouth every evening.   Yes [provider]  levothyroxine (SYNTHROID, LEVOTHROID) 75 MCG tablet Take 75 mcg by mouth daily before breakfast.    Yes [provider]  Multiple Vitamin (MULTIVITAMIN WITH MINERALS) TABS tablet Take 1 tablet by mouth every evening.    Yes [provider]  Neo-Bacit-Poly-Lidocaine (FIRST AID PLUS LIDOCAINE EX) Apply 1 application topically Every Tuesday,Thursday,and Saturday with dialysis. Prior to dialysis   Yes [provider]  Nutritional Supplements (NEPRO) LIQD Take 1 Can by mouth 2 (two) times daily. Take 1 can by mouth twice a day    Yes [provider]  omeprazole (PRILOSEC) 40 MG capsule Take 40 mg by mouth every evening.   Yes [provider]  sodium phosphate Pediatric (FLEET) 3.5-9.5 GM/59ML enema Place 1 enema rectally once.   Yes [provider]  tamsulosin (FLOMAX) 0.4 MG CAPS capsule Take 0.4 mg by mouth daily at 8 pm. Give 30 minutes after a meal   Yes [provider]  torsemide (DEMADEX) 10 MG tablet Take 5 tablets (50 mg total) by mouth daily. 04/07/16  Yes [name], MD  warfarin (COUMADIN) 4 MG tablet Take 4 mg by mouth daily at 6 PM.    Yes [provider]  enoxaparin (LOVENOX) 100 MG/ML injection Inject 85 mg into the skin every 12 (twelve) hours.    [provider]    Review of  Systems:  Unobtainable other than discussed above secondary to patient's mental retardation Physical Exam: Vitals:   05/01/17 0815 05/01/17 0900 05/01/17 0938 05/01/17 1000  BP: 102/60 (!) 93/55 101/61 108/72  Pulse:  94 100 98  Resp: (!) 22 15 17 19   Temp:   98.4 F (36.9 C) 98.5 F (36.9 C)  TempSrc:   Oral Oral  SpO2:  100%  100%  Weight:      Height:       General:  A&O x 2 , NAD, nontoxic, pleasant/cooperative Head/Eye: No conjunctival hemorrhage, no icterus, [place]/AT, No nystagmus ENT:  No icterus,  No thrush, good dentition, no pharyngeal exudate Neck:  No masses, no lymphadenpathy, no bruits CV:  RRR, no rub, no gallop, no S3 Lung:  CTAB, good air movement, no wheeze, no rhonchi Abdomen: soft/NT, +BS, nondistended, no peritoneal signs Ext: No cyanosis, No rashes, No petechiae, No lymphangitis, No edema Neuro: CNII-XII intact, strength 4/5 in bilateral upper and lower extremities, no dysmetria  Labs on Admission:  Basic Metabolic Panel:  Recent Labs Lab 05/01/17 0440  NA 135  K 4.4  CL 99*  CO2 28  GLUCOSE 136*  BUN 51*  CREATININE 5.61*  CALCIUM 7.9*   Liver Function Tests: No results for input(s): AST, ALT, ALKPHOS, BILITOT, PROT, ALBUMIN in the last 168 hours. No results for input(s): LIPASE, AMYLASE in the last 168 hours. No results for input(s): AMMONIA in the last 168 hours. CBC:  Recent Labs Lab 05/01/17 0440 05/01/17 0812  WBC 8.5  --   NEUTROABS 5.7  --   HGB 5.8* 5.5*  HCT 17.5* 17.0*  MCV 102.3*  --   PLT 155  --    Coagulation Profile:  Recent Labs Lab 04/30/17 0630 05/01/17 0440  INR 1.09 1.22   Cardiac Enzymes: No results for input(s): CKTOTAL, CKMB, CKMBINDEX, TROPONINI in the last 168 hours. BNP: Invalid input(s): POCBNP CBG:  Recent Labs Lab 05/01/17 0820  GLUCAP 151*   Urine analysis:    Component Value Date/Time   COLORURINE AMBER (A) 12/26/2016 1243   APPEARANCEUR CLOUDY (A) 12/26/2016 1243   LABSPEC 1.020  12/26/2016 1243   PHURINE 5.5 12/26/2016 1243   GLUCOSEU NEGATIVE 12/26/2016 1243   HGBUR NEGATIVE 12/26/2016 1243   BILIRUBINUR SMALL (A) 12/26/2016 1243   KETONESUR TRACE (A) 12/26/2016 1243   PROTEINUR 30 (A) 12/26/2016 1243   UROBILINOGEN 0.2 07/20/2015 1850   NITRITE NEGATIVE 12/26/2016 1243   LEUKOCYTESUR MODERATE (A) 12/26/2016 1243   Sepsis Labs: @LABRCNTIP (procalcitonin:4,lacticidven:4) )No results found for this or any previous visit (from the past 240 hour(s)).   Radiological Exams on Admission: No results found.      Time spent:60 minutes Code Status:   FULL Family Communication:  No Family at bedside Disposition Plan: expect 2 day hospitalization Consults called: GI, nephrology DVT Prophylaxis: SCDs  Cesar Rogerson, DO  [facility] Pager [phone]  If 7PM-7AM, please contact  night-coverage [email or web address] Password [email or web address] 05/01/2017, 10:13 AM

## 2017-05-01 NOTE — Consult Note (Signed)
Referring Provider: [facility] Primary Care Physician:  Zachary Fire, MD Primary Gastroenterologist:  Dr. Gala [name]  Date of Admission: 05/01/17 Date of Consultation: 05/01/17  Reason for Consultation:  Lower GI bleed and anemia  HPI:  Zachary [name] is a 71 y.o. male with a past medical history of normocytic anemia (baseline hgb 8-9), mental retardation (he is a ward of the state and has a guardian), anxiety, acute on chronic renal failure/chronic renal insufficiency. He was recently seen in our office 04/24/2017 schedule colonoscopy plus/minus EGD for anemia and heme positive stool. 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 It was noted at that time that the patient has some legal guardian, Zachary [name] [phone]) and is not competent to sign for medical treatment.  He was referred to the emergency department from the nursing home with a noted hemoglobin of 5. On Coumadin for history of acute DVT, no Lovenox bridging. Rectal exam found bright red blood per rectum. Hemoglobin of 5.5 in the emergency department with his previous hemoglobin in May 2018 found to be 11. Transfusion was ordered. Blood pressure soft at 97/61 and heart rate around 100. Deemed hemodynamically stable but need to follow closely. Planned admission to ICU for further monitoring and treatment. Overall his baseline hemoglobin is 8-9. Warfarin has been held.  3 units of PRBC have been ordered.  Colonoscopy was completed on 04/27/2017 which found diverticula in the sigmoid colon and descending colon, 4 semi-pedunculated polyps in the sigmoid colon, descending colon, transverse colon which ranged in size from 6-22 mm status post removal and retrieval. The 3 largest polyps underwent prophylactic lipping prior to resection to minimize chance of post polypectomy bleed. Also noted annular mass in the mid ascending colon 3 cm x 5 cm in size which was biopsied. Multiple sessile polyps scattered about the ileocecal valve  and cecum which were not removed and were upstream from the annular lesion. The lesion area was tattooed. Recommended that if distal transverse, descending colon, or sigmoid colon polyps contained advanced pathology he would benefit from a subtotal colectomy; if not, plan for right hemicolectomy to remove everything from the tattoo line proximally. Recommended resuming Coumadin without Lovenox bridging.  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  Patient is historically a poor historian. Today he states he's feels good. Had some dizziness yesterday but none today. Deneis chest pain, dyspnea. Remembers seeing blood in his stool. Spoke with nursing staff and no blood since this morning at the nursing home. Discussed the possibility of surgery and surgical consult, patient states he doesn't want surgery. However, he is a ward of the state and has a guardian. No other GI complaints at this time.    Past Medical History:  Diagnosis Date  . Abnormal CT scan, kidney 10/06/2011  . Acute pyelonephritis 10/07/2011  . Anemia    normocytic  . Anxiety    mental retardation  . Bladder wall thickening 10/06/2011  . BPH (benign prostatic hypertrophy)   . Diabetes mellitus   . Edema     history of lower extremity edema  . Heme positive stool   . Hydronephrosis   . Hyperkalemia   . Hyperlipidemia   .  Hypernatremia   . Hypertension   . Hypothyroidism   . Impaired speech   . 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 MR (mental retardation)   . Obstructive uropathy   . Perinephric abscess 10/07/2011   . Poor historian poor historian  . Protein calorie malnutrition ([place])   . Pyelonephritis   . Renal failure (ARF), acute on chronic (HCC)   . Renal insufficiency    chronic history  . Smoking   . Uremia   . Urinary retention   . UTI (lower urinary tract infection) 10/06/2011  . UTI (lower urinary tract infection)     Past Surgical History:  Procedure Laterality Date  . AV FISTULA PLACEMENT Left 07/06/2015   Procedure:  INSERTION LEFT ARM ARTERIOVENOUS GORTEX GRAFT;  Surgeon: Angelia Mould, MD;  Location: [place];  Service: Vascular;  Laterality: Left;  . AV FISTULA PLACEMENT Right 02/26/2016   Procedure: ARTERIOVENOUS (AV) FISTULA CREATION ;  Surgeon: Angelia Mould, MD;  Location: [place];  Service: Vascular;  Laterality: Right;  . [place] REMOVAL Left 10/09/2015   Procedure: REMOVAL OF ARTERIOVENOUS GORETEX GRAFT ([place]) Evacuation of Lymphocele, Vein Patch angioplasty of brachial artery.;  Surgeon: Angelia Mould, MD;  Location: [place];  Service: Vascular;  Laterality: Left;  . BASCILIC VEIN TRANSPOSITION Right 02/26/2016   Procedure: Right BASCILIC VEIN TRANSPOSITION;  Surgeon: Angelia Mould, MD;  Location: [place];  Service: Vascular;  Laterality: Right;  . CIRCUMCISION N/A 01/04/2014   Procedure: CIRCUMCISION ADULT (procedure #1);  Surgeon: Marissa Nestle, MD;  Location: [facility];  Service: Urology;  Laterality: N/A;  . CYSTOSCOPY W/ RETROGRADES Bilateral 06/29/2015   Procedure: CYSTOSCOPY, DILATION OF URETHRAL STRICTURE WITH BILATERAL RETROGRADE PYELOGRAM,SUPRAPUBIC TUBE CHANGE;  Surgeon: Festus Aloe, MD;  Location: [facility];  Service: Urology;  Laterality: Bilateral;  . CYSTOSCOPY WITH URETHRAL DILATATION N/A 12/29/2013   Procedure: CYSTOSCOPY WITH URETHRAL DILATATION;  Surgeon: Marissa Nestle, MD;  Location: [facility];  Service: Urology;  Laterality: N/A;  . ORIF FEMUR FRACTURE Right 11/22/2016   Procedure: OPEN REDUCTION INTERNAL FIXATION (ORIF) DISTAL FEMUR  FRACTURE;  Surgeon: Rod Can, MD;  Location: [place];  Service: Orthopedics;  Laterality: Right;  . PERIPHERAL VASCULAR CATHETERIZATION N/A 10/08/2015   Procedure: A/V Shuntogram;  Surgeon: Angelia Mould, MD;  Location: [place] [facility];  Service: Cardiovascular;  Laterality: N/A;  . TRANSURETHRAL RESECTION OF PROSTATE N/A 01/04/2014   Procedure: TRANSURETHRAL RESECTION OF THE PROSTATE (TURP) (procedure #2);  Surgeon: Marissa Nestle, MD;  Location: [facility];  Service: Urology;  Laterality: N/A;    Prior to Admission medications   Medication Sig Start Date End Date Taking? Authorizing Provider  Amino Acids-Protein Hydrolys (FEEDING SUPPLEMENT, PRO-STAT 64,) LIQD Take 30 mLs by mouth 2 (two) times daily between meals.   Yes [provider]  atorvastatin (LIPITOR) 40 MG tablet Take 40 mg by mouth every evening.   Yes [provider]  levothyroxine (SYNTHROID, LEVOTHROID) 75 MCG tablet Take 75 mcg by mouth daily before breakfast.    Yes [provider]  Multiple Vitamin (MULTIVITAMIN WITH MINERALS) TABS tablet Take 1 tablet by mouth every evening.    Yes [provider]  Neo-Bacit-Poly-Lidocaine (FIRST AID PLUS LIDOCAINE EX) Apply 1 application topically Every Tuesday,Thursday,and Saturday with dialysis. Prior to dialysis   Yes [provider]  Nutritional Supplements (NEPRO) LIQD Take 1 Can by mouth 2 (two) times daily. Take 1 can by mouth twice a day  Yes [provider]  omeprazole (PRILOSEC) 40 MG capsule Take 40 mg by mouth every evening.   Yes [provider]  sodium phosphate Pediatric (FLEET) 3.5-9.5 GM/59ML enema Place 1 enema rectally once.   Yes [provider]  tamsulosin (FLOMAX) 0.4 MG CAPS capsule Take 0.4 mg by mouth daily at 8 pm. Give 30 minutes after a meal   Yes [provider]  torsemide (DEMADEX) 10 MG tablet Take 5 tablets (50 mg total) by mouth daily. 04/07/16  Yes Zachary Fire, MD   warfarin (COUMADIN) 4 MG tablet Take 4 mg by mouth daily at 6 PM.    Yes [provider]  enoxaparin (LOVENOX) 100 MG/ML injection Inject 85 mg into the skin every 12 (twelve) hours.    [provider]    Current Facility-Administered Medications  Medication Dose Route Frequency Provider Last Rate Last Dose  . acetaminophen (TYLENOL) tablet 650 mg  650 mg Oral Q6H PRN Tat, Shanon Brow, MD       Or  . acetaminophen (TYLENOL) suppository 650 mg  650 mg Rectal Q6H PRN Tat, Shanon Brow, MD      . atorvastatin (LIPITOR) tablet 40 mg  40 mg Oral QPM Tat, David, MD      . feeding supplement (PRO-STAT 64) liquid 30 mL  30 mL Oral BID BM Tat, Shanon Brow, MD      . Derrill Memo ON 05/02/2017] levothyroxine (SYNTHROID, LEVOTHROID) tablet 75 mcg  75 mcg Oral QAC breakfast Tat, David, MD      . multivitamin with minerals tablet 1 tablet  1 tablet Oral QPM Tat, David, MD      . ondansetron (ZOFRAN) tablet 4 mg  4 mg Oral Q6H PRN Tat, David, MD       Or  . ondansetron (ZOFRAN) injection 4 mg  4 mg Intravenous Q6H PRN Tat, David, MD      . pantoprazole (PROTONIX) EC tablet 80 mg  80 mg Oral Daily Tat, David, MD      . tamsulosin (FLOMAX) capsule 0.4 mg  0.4 mg Oral Q2000 Tat, David, MD      . torsemide (DEMADEX) tablet 50 mg  50 mg Oral Daily Tat, Shanon Brow, MD        Allergies as of 05/01/2017  . (No Known Allergies)    Family History  Problem Relation Age of Onset  . Cancer Mother   . Colon cancer Neg Hx     Social History   Social History  . Marital status: Single    Spouse name: N/A  . Number of children: N/A  . Years of education: N/A   Occupational History  . Not on file.   Social History Main Topics  . Smoking status: Never Smoker  . Smokeless tobacco: Never Used  . Alcohol use No     Comment: occ. use   . Drug use: No  . Sexual activity: No   Other Topics Concern  . Not on file   Social History Narrative   Lives at nursing home.    Review of Systems: LIMITED DUE TO HISTORY OF  MR/POOR HISTORIAN General: Negative for fatigue, weakness. Admits dizziness.  CV: Negative for chest pain, dyspnea.  Respiratory: Negative for dyspnea at rest.  GI: See history of present illness. Heme: Admits rectal bleeding.  Physical Exam: Vital signs in last 24 hours: Temp:  [98.2 F (36.8 C)-98.7 F (37.1 C)] 98.2 F (36.8 C) (07/06 1126) Pulse Rate:  [89-100] 90 (07/06 1200) Resp:  [11-25] 11 (  07/06 1200) BP: (93-117)/(55-72) 111/70 (07/06 1200) SpO2:  [100 %] 100 % (07/06 1200) Weight:  [173 lb (78.5 kg)-196 lb 3.4 oz (89 kg)] 196 lb 3.4 oz (89 kg) (07/06 1200)   General:   Alert,  Well-developed, well-nourished, pleasant and cooperative in NAD Head:  Normocephalic and atraumatic. Eyes:  Sclera clear, no icterus. Conjunctiva pink. Ears:  Normal auditory acuity. Neck:  Supple; no masses or thyromegaly. Lungs:  Clear throughout to auscultation. No wheezes, crackles, or rhonchi. No acute distress. Heart:  Regular rate and rhythm; no murmurs, clicks, rubs,  or gallops. Abdomen:  Rounded but soft, nontender and nondistended. No masses, hepatosplenomegaly or hernias noted. Normal bowel sounds, without guarding, and without rebound.   Rectal:  Deferred.   Msk:  Symmetrical without gross deformities. Pulses:  Normal pulses noted. Extremities:  Noted RLE edema, specifically above the knee (compared to LLE). Neurologic:  Alert and oriented;  grossly normal neurologically. Skin:  Intact without significant lesions or rashes. Psych:  Alert and cooperative. Normal mood and affect.  Intake/Output from previous day: No intake/output data recorded. Intake/Output this shift: Total I/O In: 320 [Blood:320] Out: -   Lab Results:  Recent Labs  05/01/17 0440 05/01/17 0812  WBC 8.5  --   HGB 5.8* 5.5*  HCT 17.5* 17.0*  PLT 155  --    BMET  Recent Labs  05/01/17 0440  NA 135  K 4.4  CL 99*  CO2 28  GLUCOSE 136*  BUN 51*  CREATININE 5.61*  CALCIUM 7.9*   LFT  Recent  Labs  05/01/17 0440  ALBUMIN 2.9*   PT/INR  Recent Labs  04/30/17 0630 05/01/17 0440  LABPROT 14.1 15.5*  INR 1.09 1.22   Hepatitis Panel No results for input(s): HEPBSAG, HCVAB, HEPAIGM, HEPBIGM in the last 72 hours. C-Diff No results for input(s): CDIFFTOX in the last 72 hours.  Studies/Results: No results found.  Impression: 71 year old male with acute GI bleed on top of chronic anemia (baseline hgb 8-9). The patient recently underwent colonoscopy (4 days ago) with multiple large polyps (some resected, some not) and annular mass in the mid-ascending colon s/p tattooing. Multiple polyps and biopsies with noted high-grade dysplasia. Endoscopist impression, likely malignancy with the annular mass. Prepolypectomy clips placed to prevent post-polypectomy bleed. He had GI bleed in the nursing home and presented to the ER with hgb 5.8 -> 5.5. Some dizziness, no other anemia symptoms (chest pain, dyspnea). Anticoagulants have been held. No further bleeding since this morning (in the nursing home). Given the pathology results from his colonoscopy, and endoscopic impression, patient likely having polyp and mass bleeding, would most benefit from subtotal colectomy for suspected colon malignancy and multiple high-grade dysplasia polyps.  He is hemodynamically stable at this time with SBP 113 and HR 90s. No dyspnea or respitatory distress noted. He is a ward of the state and not competent to sign consent (guardian: Zachary [name] at [phone] extension [phone]). He has some residual RLE edema with known DVT. INR 1.22. At this time no benefit to repeat endoscopic evaluation.  Plan: 1. Vitamin K 5mg  IV 2. Hold all/any anticoagulants 3. Monitor closely for further GI bleeding 4. Monitor H/H closely 5. Transfuse as necessary 6. Surgical consult for consideration of subtotal colectomy 7. Supportive measures.   Thank you for allowing [place] to participate in the care of [place],  DNP, AGNP-C Adult & Gerontological Nurse Practitioner [facility]    LOS: 1 day  05/01/2017, 12:23 PM

## 2017-05-01 NOTE — ED Notes (Signed)
One iv stick to left ac with no success. edp in and obtained 18g to left ac on first attempt

## 2017-05-01 NOTE — ED Notes (Signed)
Called [facility] back and they stated will have someone call.

## 2017-05-01 NOTE — ED Notes (Signed)
Melissa Price from [facility] states it is fine to give blood tranfusions. Was also verbalized to Reyes Ivan RN

## 2017-05-01 NOTE — ED Triage Notes (Signed)
Pt from [facility].  Has current DVT. On dialysis.  States hgb was 5 this am. Pt c/o dizziness. Alert/oriented to most. Some chronic confusion noted. rle swelling and warmth noted. Denies pain

## 2017-05-02 DIAGNOSIS — K6389 Other specified diseases of intestine: Secondary | ICD-10-CM | POA: Diagnosis present

## 2017-05-02 DIAGNOSIS — D631 Anemia in chronic kidney disease: Secondary | ICD-10-CM | POA: Diagnosis not present

## 2017-05-02 DIAGNOSIS — F419 Anxiety disorder, unspecified: Secondary | ICD-10-CM | POA: Diagnosis present

## 2017-05-02 DIAGNOSIS — K635 Polyp of colon: Secondary | ICD-10-CM | POA: Diagnosis not present

## 2017-05-02 DIAGNOSIS — C182 Malignant neoplasm of ascending colon: Secondary | ICD-10-CM | POA: Diagnosis not present

## 2017-05-02 DIAGNOSIS — K91840 Postprocedural hemorrhage and hematoma of a digestive system organ or structure following a digestive system procedure: Secondary | ICD-10-CM | POA: Diagnosis present

## 2017-05-02 DIAGNOSIS — R509 Fever, unspecified: Secondary | ICD-10-CM | POA: Diagnosis not present

## 2017-05-02 DIAGNOSIS — E785 Hyperlipidemia, unspecified: Secondary | ICD-10-CM | POA: Diagnosis present

## 2017-05-02 DIAGNOSIS — K828 Other specified diseases of gallbladder: Secondary | ICD-10-CM | POA: Diagnosis not present

## 2017-05-02 DIAGNOSIS — K219 Gastro-esophageal reflux disease without esophagitis: Secondary | ICD-10-CM | POA: Diagnosis not present

## 2017-05-02 DIAGNOSIS — Z992 Dependence on renal dialysis: Secondary | ICD-10-CM | POA: Diagnosis not present

## 2017-05-02 DIAGNOSIS — K639 Disease of intestine, unspecified: Secondary | ICD-10-CM | POA: Diagnosis not present

## 2017-05-02 DIAGNOSIS — N186 End stage renal disease: Secondary | ICD-10-CM | POA: Diagnosis present

## 2017-05-02 DIAGNOSIS — I120 Hypertensive chronic kidney disease with stage 5 chronic kidney disease or end stage renal disease: Secondary | ICD-10-CM | POA: Diagnosis present

## 2017-05-02 DIAGNOSIS — I10 Essential (primary) hypertension: Secondary | ICD-10-CM | POA: Diagnosis not present

## 2017-05-02 DIAGNOSIS — Z9049 Acquired absence of other specified parts of digestive tract: Secondary | ICD-10-CM | POA: Diagnosis not present

## 2017-05-02 DIAGNOSIS — R4789 Other speech disturbances: Secondary | ICD-10-CM | POA: Diagnosis present

## 2017-05-02 DIAGNOSIS — E1129 Type 2 diabetes mellitus with other diabetic kidney complication: Secondary | ICD-10-CM | POA: Diagnosis not present

## 2017-05-02 DIAGNOSIS — S72421D Displaced fracture of lateral condyle of right femur, subsequent encounter for closed fracture with routine healing: Secondary | ICD-10-CM | POA: Diagnosis not present

## 2017-05-02 DIAGNOSIS — D509 Iron deficiency anemia, unspecified: Secondary | ICD-10-CM | POA: Diagnosis not present

## 2017-05-02 DIAGNOSIS — F70 Mild intellectual disabilities: Secondary | ICD-10-CM | POA: Diagnosis not present

## 2017-05-02 DIAGNOSIS — K922 Gastrointestinal hemorrhage, unspecified: Secondary | ICD-10-CM | POA: Diagnosis present

## 2017-05-02 DIAGNOSIS — D62 Acute posthemorrhagic anemia: Secondary | ICD-10-CM | POA: Diagnosis not present

## 2017-05-02 DIAGNOSIS — K5730 Diverticulosis of large intestine without perforation or abscess without bleeding: Secondary | ICD-10-CM | POA: Diagnosis present

## 2017-05-02 DIAGNOSIS — Y848 Other medical procedures as the cause of abnormal reaction of the patient, or of later complication, without mention of misadventure at the time of the procedure: Secondary | ICD-10-CM | POA: Diagnosis present

## 2017-05-02 DIAGNOSIS — D124 Benign neoplasm of descending colon: Secondary | ICD-10-CM | POA: Diagnosis present

## 2017-05-02 DIAGNOSIS — E039 Hypothyroidism, unspecified: Secondary | ICD-10-CM | POA: Diagnosis present

## 2017-05-02 DIAGNOSIS — A419 Sepsis, unspecified organism: Secondary | ICD-10-CM | POA: Diagnosis not present

## 2017-05-02 DIAGNOSIS — R4189 Other symptoms and signs involving cognitive functions and awareness: Secondary | ICD-10-CM | POA: Diagnosis present

## 2017-05-02 DIAGNOSIS — N400 Enlarged prostate without lower urinary tract symptoms: Secondary | ICD-10-CM | POA: Diagnosis present

## 2017-05-02 DIAGNOSIS — D125 Benign neoplasm of sigmoid colon: Secondary | ICD-10-CM | POA: Diagnosis present

## 2017-05-02 DIAGNOSIS — D122 Benign neoplasm of ascending colon: Secondary | ICD-10-CM | POA: Diagnosis present

## 2017-05-02 DIAGNOSIS — F79 Unspecified intellectual disabilities: Secondary | ICD-10-CM | POA: Diagnosis present

## 2017-05-02 DIAGNOSIS — M6281 Muscle weakness (generalized): Secondary | ICD-10-CM | POA: Diagnosis not present

## 2017-05-02 DIAGNOSIS — E1122 Type 2 diabetes mellitus with diabetic chronic kidney disease: Secondary | ICD-10-CM | POA: Diagnosis present

## 2017-05-02 DIAGNOSIS — K921 Melena: Secondary | ICD-10-CM | POA: Diagnosis present

## 2017-05-02 DIAGNOSIS — N179 Acute kidney failure, unspecified: Secondary | ICD-10-CM | POA: Diagnosis present

## 2017-05-02 DIAGNOSIS — E1165 Type 2 diabetes mellitus with hyperglycemia: Secondary | ICD-10-CM | POA: Diagnosis present

## 2017-05-02 DIAGNOSIS — N200 Calculus of kidney: Secondary | ICD-10-CM | POA: Diagnosis not present

## 2017-05-02 DIAGNOSIS — Z9181 History of falling: Secondary | ICD-10-CM | POA: Diagnosis not present

## 2017-05-02 DIAGNOSIS — K567 Ileus, unspecified: Secondary | ICD-10-CM | POA: Diagnosis not present

## 2017-05-02 DIAGNOSIS — R6250 Unspecified lack of expected normal physiological development in childhood: Secondary | ICD-10-CM | POA: Diagnosis present

## 2017-05-02 DIAGNOSIS — D123 Benign neoplasm of transverse colon: Secondary | ICD-10-CM | POA: Diagnosis present

## 2017-05-02 DIAGNOSIS — N401 Enlarged prostate with lower urinary tract symptoms: Secondary | ICD-10-CM | POA: Diagnosis not present

## 2017-05-02 DIAGNOSIS — D649 Anemia, unspecified: Secondary | ICD-10-CM | POA: Diagnosis not present

## 2017-05-02 DIAGNOSIS — N3289 Other specified disorders of bladder: Secondary | ICD-10-CM | POA: Diagnosis not present

## 2017-05-02 DIAGNOSIS — K625 Hemorrhage of anus and rectum: Secondary | ICD-10-CM | POA: Diagnosis not present

## 2017-05-02 LAB — RENAL FUNCTION PANEL
Albumin: 2.9 g/dL — ABNORMAL LOW (ref 3.5–5.0)
Anion gap: 9 (ref 5–15)
BUN: 61 mg/dL — ABNORMAL HIGH (ref 6–20)
CO2: 26 mmol/L (ref 22–32)
Calcium: 8 mg/dL — ABNORMAL LOW (ref 8.9–10.3)
Chloride: 103 mmol/L (ref 101–111)
Creatinine, Ser: 6.16 mg/dL — ABNORMAL HIGH (ref 0.61–1.24)
GFR calc Af Amer: 10 mL/min — ABNORMAL LOW (ref >60)
GFR calc non Af Amer: 8 mL/min — ABNORMAL LOW (ref >60)
Glucose, Bld: 103 mg/dL — ABNORMAL HIGH (ref 65–99)
Phosphorus: 5.5 mg/dL — ABNORMAL HIGH (ref 2.5–4.6)
Potassium: 4.1 mmol/L (ref 3.5–5.1)
Sodium: 138 mmol/L (ref 135–145)

## 2017-05-02 LAB — CBC
HCT: 20.5 % — ABNORMAL LOW (ref 39.0–52.0)
Hemoglobin: 6.9 g/dL — CL (ref 13.0–17.0)
MCH: 31.9 pg (ref 26.0–34.0)
MCHC: 33.7 g/dL (ref 30.0–36.0)
MCV: 94.9 fL (ref 78.0–100.0)
Platelets: 149 10*3/uL — ABNORMAL LOW (ref 150–400)
RBC: 2.16 MIL/uL — ABNORMAL LOW (ref 4.22–5.81)
RDW: 19.9 % — ABNORMAL HIGH (ref 11.5–15.5)
WBC: 6.9 10*3/uL (ref 4.0–10.5)

## 2017-05-02 LAB — PREPARE RBC (CROSSMATCH)

## 2017-05-02 MED ORDER — PENTAFLUOROPROP-TETRAFLUOROETH EX AERO
1.0000 "application " | INHALATION_SPRAY | CUTANEOUS | Status: DC | PRN
  Filled 2017-05-02: qty 30

## 2017-05-02 MED ORDER — SODIUM CHLORIDE 0.9 % IV SOLN
Freq: Once | INTRAVENOUS | Status: AC
  Administered 2017-05-02: 12:00:00 via INTRAVENOUS

## 2017-05-02 MED ORDER — LIDOCAINE HCL (PF) 1 % IJ SOLN: 5.0000 mL | INTRAMUSCULAR | Status: DC | PRN

## 2017-05-02 MED ORDER — SODIUM CHLORIDE 0.9 % IV SOLN
100.0000 mL | INTRAVENOUS | Status: DC | PRN
  Administered 2017-05-04: 10:00:00 via INTRAVENOUS

## 2017-05-02 MED ORDER — LIDOCAINE-PRILOCAINE 2.5-2.5 % EX CREA
1.0000 "application " | TOPICAL_CREAM | CUTANEOUS | Status: DC | PRN
  Filled 2017-05-02: qty 5

## 2017-05-02 MED ORDER — SODIUM CHLORIDE 0.9 % IV SOLN: 100.0000 mL | INTRAVENOUS | Status: DC | PRN

## 2017-05-02 NOTE — Progress Notes (Signed)
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 He has multiple other medical problems including end-stage renal disease, hypertension, hyperlipidemia hypothyroidism poor glucose tolerance and cognitive impairment. His situation is complicated by the fact that he had a right femoral fracture had open reduction internal fixation in January then had right distal femoral DVT discovered in February and he was started on Coumadin. He later developed heme positive stools. He underwent colonoscopy on 04/27/2017 which shows multiple polyps and also an annular appearing mass in the ascending colon. 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 He says he feels okay. He received a blood transfusion yesterday but his hemoglobin has dropped again. He has no complaints of abdominal pain and there is not any overt GI bleeding right now.  Objective: Vital signs in last 24 hours: Temp:  [97.4 F (36.3 C)-98.7 F (37.1 C)] 98.5 F (36.9 C) (07/07 0747) Pulse Rate:  [80-105] 88 (07/07 0900) Resp:  [9-25] 11 (07/07 0900) BP: (98-142)/(60-78) 100/63 (07/07 0900) SpO2:  [98 %-100 %] 100 % (07/07 0900) Weight:  [89 kg (196 lb 3.4 oz)-90.2 kg (198 lb 13.7 oz)] 90.2 kg (198 lb 13.7 oz) (07/07 0400) Weight change:  Last BM Date: 05/01/17  Intake/Output from previous day: 07/06 0701 - 07/07 0700 In: 870 [P.O.:120; Blood:700; IV Piggyback:50] Out: 300 [Urine:300]  PHYSICAL EXAM General appearance: alert, cooperative and no distress Resp: clear to auscultation bilaterally Cardio: regular rate and rhythm, S1, S2 normal, no murmur, click, rub or gallop GI: soft, non-tender; bowel sounds normal; no masses,  no organomegaly Extremities: extremities normal, atraumatic, no cyanosis or edema He appears to have cognitive impairment  Lab Results:  Results for orders placed or performed during the hospital encounter of 05/01/17 (from the past 48 hour(s))  Type and screen [facility]      Status: None (Preliminary result)   Collection Time: 05/01/17  8:12 AM  Result Value Ref Range   ABO/RH(D) B POS    Antibody Screen NEG    Sample Expiration 05/04/2017    Unit Number [id number]    Blood Component Type RED CELLS,LR    Unit division 00    Status of Unit ISSUED,FINAL    Transfusion Status OK TO TRANSFUSE    Crossmatch Result Compatible    Unit Number [id number]    Blood Component Type RED CELLS,LR    Unit division 00    Status of Unit ISSUED,FINAL    Transfusion Status OK TO TRANSFUSE    Crossmatch Result Compatible    Unit Number [id number]    Blood Component Type RED CELLS,LR    Unit division 00    Status of Unit ALLOCATED    Transfusion Status OK TO TRANSFUSE    Crossmatch Result Compatible   Vitamin B12     Status: None   Collection Time: 05/01/17  8:12 AM  Result Value Ref Range   Vitamin B-12 533 180 - 914 pg/mL    Comment: (NOTE) This assay is not validated for testing neonatal or myeloproliferative syndrome specimens for Vitamin B12 levels. Performed at [facility], [place] [address] [place], [place] [address]   Iron and TIBC     Status: Abnormal   Collection Time: 05/01/17  8:12 AM  Result Value Ref Range   Iron 34 (L) 45 - 182 ug/dL   TIBC 231 (L) 250 - 450 ug/dL   Saturation Ratios 15 (L) 17.9 - 39.5 %   UIBC 197 ug/dL  Comment: Performed at [facility], [place] [address], [place], [place] [address]  Ferritin     Status: None   Collection Time: 05/01/17  8:12 AM  Result Value Ref Range   Ferritin 85 24 - 336 ng/mL    Comment: Performed at [facility], [place] [address], [place], [place] [address]  Reticulocytes     Status: Abnormal   Collection Time: 05/01/17  8:12 AM  Result Value Ref Range   Retic Ct Pct 7.5 (H) 0.4 - 3.1 %   RBC. 1.64 (L) 4.22 - 5.81 MIL/uL   Retic Count, Absolute 123.0 19.0 - 186.0 K/uL  Lactate dehydrogenase     Status: None   Collection Time: 05/01/17  8:12 AM  Result Value Ref Range    LDH 104 98 - 192 U/L  Hemoglobin and hematocrit, blood     Status: Abnormal   Collection Time: 05/01/17  8:12 AM  Result Value Ref Range   Hemoglobin 5.5 (LL) 13.0 - 17.0 g/dL    Comment: REPEATED TO VERIFY CRITICAL RESULT CALLED TO, READ BACK BY AND VERIFIED WITH: EDWARDS,C AT 0835 BY HFLYNT 05/01/17    HCT 17.0 (L) 39.0 - 52.0 %  Folate     Status: None   Collection Time: 05/01/17  8:16 AM  Result Value Ref Range   Folate 56.7 >5.9 ng/mL    Comment: RESULTS CONFIRMED BY MANUAL DILUTION Performed at [facility], [place] [address], [place], [place] [address]   POC CBG, ED     Status: Abnormal   Collection Time: 05/01/17  8:20 AM  Result Value Ref Range   Glucose-Capillary 151 (H) 65 - 99 mg/dL  Prepare RBC     Status: None   Collection Time: 05/01/17  9:00 AM  Result Value Ref Range   Order Confirmation ORDER PROCESSED BY BLOOD BANK   MRSA PCR Screening     Status: Abnormal   Collection Time: 05/01/17 11:22 AM  Result Value Ref Range   MRSA by PCR POSITIVE (A) NEGATIVE    Comment:        The GeneXpert MRSA Assay (FDA approved for NASAL specimens only), is one component of a comprehensive MRSA colonization surveillance program. It is not intended to diagnose MRSA infection nor to guide or monitor treatment for MRSA infections. RESULT CALLED TO, READ BACK BY AND VERIFIED WITH: HYLTON L. AT 1406 ON [id number] BY THOMPSON S.   CBC     Status: Abnormal   Collection Time: 05/01/17  3:41 PM  Result Value Ref Range   WBC 9.9 4.0 - 10.5 K/uL   RBC 2.80 (L) 4.22 - 5.81 MIL/uL   Hemoglobin 9.0 (L) 13.0 - 17.0 g/dL    Comment: DELTA CHECK NOTED POST TRANSFUSION SPECIMEN    HCT 26.6 (L) 39.0 - 52.0 %   MCV 95.0 78.0 - 100.0 fL    Comment: DELTA CHECK NOTED POST TRANSFUSION SPECIMEN    MCH 32.1 26.0 - 34.0 pg   MCHC 33.8 30.0 - 36.0 g/dL   RDW 18.9 (H) 11.5 - 15.5 %   Platelets 140 (L) 150 - 400 K/uL  CBC     Status: Abnormal   Collection Time: 05/02/17  4:36 AM  Result  Value Ref Range   WBC 6.9 4.0 - 10.5 K/uL   RBC 2.16 (L) 4.22 - 5.81 MIL/uL   Hemoglobin 6.9 (LL) 13.0 - 17.0 g/dL    Comment: REPEATED TO VERIFY DELTA CHECK NOTED CRITICAL RESULT CALLED  TO, READ BACK BY AND VERIFIED WITH: WAGONER,R RN AT 0620 BY HFLYNT 05/02/17    HCT 20.5 (L) 39.0 - 52.0 %   MCV 94.9 78.0 - 100.0 fL   MCH 31.9 26.0 - 34.0 pg   MCHC 33.7 30.0 - 36.0 g/dL   RDW 19.9 (H) 11.5 - 15.5 %   Platelets 149 (L) 150 - 400 K/uL  Renal function panel     Status: Abnormal   Collection Time: 05/02/17  4:36 AM  Result Value Ref Range   Sodium 138 135 - 145 mmol/L   Potassium 4.1 3.5 - 5.1 mmol/L   Chloride 103 101 - 111 mmol/L   CO2 26 22 - 32 mmol/L   Glucose, Bld 103 (H) 65 - 99 mg/dL   BUN 61 (H) 6 - 20 mg/dL   Creatinine, Ser 6.16 (H) 0.61 - 1.24 mg/dL   Calcium 8.0 (L) 8.9 - 10.3 mg/dL   Phosphorus 5.5 (H) 2.5 - 4.6 mg/dL   Albumin 2.9 (L) 3.5 - 5.0 g/dL   GFR calc non Af Amer 8 (L) >60 mL/min   GFR calc Af Amer 10 (L) >60 mL/min    Comment: (NOTE) The eGFR has been calculated using the CKD EPI equation. This calculation has not been validated in all clinical situations. eGFR's persistently <60 mL/min signify possible Chronic Kidney Disease.    Anion gap 9 5 - 15    ABGS No results for input(s): PHART, PO2ART, TCO2, HCO3 in the last 72 hours.  Invalid input(s): PCO2 CULTURES Recent Results (from the past 240 hour(s))  MRSA PCR Screening     Status: Abnormal   Collection Time: 05/01/17 11:22 AM  Result Value Ref Range Status   MRSA by PCR POSITIVE (A) NEGATIVE Final    Comment:        The GeneXpert MRSA Assay (FDA approved for NASAL specimens only), is one component of a comprehensive MRSA colonization surveillance program. It is not intended to diagnose MRSA infection nor to guide or monitor treatment for MRSA infections. RESULT CALLED TO, READ BACK BY AND VERIFIED WITH: HYLTON L. AT 1406 ON [id number] BY THOMPSON S.    Studies/Results: US Venous Img  Lower Unilateral Right  Result Date: 05/01/2017 CLINICAL DATA:  History of deep venous thrombosis. Right lower extremity swelling and warmth. EXAM: RIGHT LOWER EXTREMITY VENOUS DOPPLER ULTRASOUND TECHNIQUE: Gray-scale sonography with graded compression, as well as color Doppler and duplex ultrasound were performed to evaluate the lower extremity deep venous systems from the level of the common femoral vein and including the common femoral, femoral, profunda femoral, popliteal and calf veins including the posterior tibial, peroneal and gastrocnemius veins when visible. The superficial great saphenous vein was also interrogated. Spectral Doppler was utilized to evaluate flow at rest and with distal augmentation maneuvers in the common femoral, femoral and popliteal veins. COMPARISON:  None. FINDINGS: Contralateral Common Femoral Vein: Respiratory phasicity is normal and symmetric with the symptomatic side. No evidence of thrombus. Normal compressibility. Common Femoral Vein: No evidence of thrombus. Normal compressibility, respiratory phasicity and response to augmentation. Saphenofemoral Junction: No evidence of thrombus. Normal compressibility and flow on color Doppler imaging. Profunda Femoral Vein: No evidence of thrombus. Normal compressibility and flow on color Doppler imaging. Femoral Vein: No evidence of thrombus. Normal compressibility, respiratory phasicity and response to augmentation. Popliteal Vein: No evidence of thrombus. Normal compressibility, respiratory phasicity and response to augmentation. Calf Veins: No evidence of thrombus. Normal compressibility and flow on color Doppler imaging. Superficial Great  Saphenous Vein: No evidence of thrombus. Normal compressibility and flow on color Doppler imaging. Venous Reflux:  None. Other Findings:  None. IMPRESSION: No evidence of DVT within the right lower extremity. Electronically Signed   By: Lajean Manes M.D.   On: 05/01/2017 14:27    Medications:   Prior to Admission:  Prescriptions Prior to Admission  Medication Sig Dispense Refill Last Dose  . Amino Acids-Protein Hydrolys (FEEDING SUPPLEMENT, PRO-STAT 64,) LIQD Take 30 mLs by mouth 2 (two) times daily between meals.   04/30/2017 at Unknown time  . atorvastatin (LIPITOR) 40 MG tablet Take 40 mg by mouth every evening.   04/30/2017 at unkown  . levothyroxine (SYNTHROID, LEVOTHROID) 75 MCG tablet Take 75 mcg by mouth daily before breakfast.    05/01/2017 at Unknown time  . Multiple Vitamin (MULTIVITAMIN WITH MINERALS) TABS tablet Take 1 tablet by mouth every evening.    04/30/2017 at Unknown time  . Neo-Bacit-Poly-Lidocaine (FIRST AID PLUS LIDOCAINE EX) Apply 1 application topically Every Tuesday,Thursday,and Saturday with dialysis. Prior to dialysis   04/30/2017 at Unknown time  . Nutritional Supplements (NEPRO) LIQD Take 1 Can by mouth 2 (two) times daily. Take 1 can by mouth twice a day    04/30/2017 at Unknown time  . omeprazole (PRILOSEC) 40 MG capsule Take 40 mg by mouth every evening.   04/30/2017 at Unknown time  . sodium phosphate Pediatric (FLEET) 3.5-9.5 GM/59ML enema Place 1 enema rectally once.   04/27/2017 at unkown  . tamsulosin (FLOMAX) 0.4 MG CAPS capsule Take 0.4 mg by mouth daily at 8 pm. Give 30 minutes after a meal   04/30/2017 at Unknown time  . torsemide (DEMADEX) 10 MG tablet Take 5 tablets (50 mg total) by mouth daily. 30 tablet 3 04/30/2017 at Unknown time  . warfarin (COUMADIN) 4 MG tablet Take 4 mg by mouth daily at 6 PM.    04/30/2017 at 1800  . enoxaparin (LOVENOX) 100 MG/ML injection Inject 85 mg into the skin every 12 (twelve) hours.   04/26/2017 at 2200   Scheduled: . atorvastatin  40 mg Oral QPM  . Chlorhexidine Gluconate Cloth  6 each Topical Q0600  . feeding supplement (PRO-STAT 64)  30 mL Oral BID BM  . levothyroxine  75 mcg Oral QAC breakfast  . multivitamin with minerals  1 tablet Oral QPM  . mupirocin ointment  1 application Nasal BID  . pantoprazole  80 mg Oral Daily   . tamsulosin  0.4 mg Oral Q2000  . torsemide  50 mg Oral Daily   Continuous:  DDU:[id number] **OR** acetaminophen, ondansetron **OR** ondansetron (ZOFRAN) IV  Assesment: He has lower GI bleed. He has a colonic mass. He is anemic which is acute blood loss anemia. He's going to need some more blood today. He has end-stage renal disease on dialysis and he is going to dialyze today and receive a unit of blood during dialysis. He is apparently a ward of the state so will have to get permission from social services for him to have the needed hemicolectomy. This is not urgent right now Active Problems:   Mental retardation   Hyperlipidemia   Acute blood loss anemia   ESRD (end stage renal disease) ([place])   Anemia in CKD (chronic kidney disease)   Colonic mass   Lower GI bleed   GI bleed    Plan: Continue treatments. Blood transfusion with dialysis. He was in observation status and I have had him converted to inpatient.    LOS: 1  day   Zina Pitzer L 05/02/2017, 9:33 AM

## 2017-05-02 NOTE — Procedures (Signed)
    HEMODIALYSIS TREATMENT NOTE:   4 hour heparin-free dialysis completed via right upper arm AVF (16g/antegrade). Goal met: 2 liters removed without interruption in ultrafiltration.  Two units PRBCs transfused with HD.  All blood was returned and hemostasis was achieved within 10 minutes. Report given to Deno Etienne, RN.  Rockwell Alexandria, RN, CDN

## 2017-05-02 NOTE — Progress Notes (Addendum)
Subjective: No overt GI bleeding. Undergoing dialysis currently. Spoke with nursing staff who verified no overt bleeding. Patient poor historian: denies abdominal pain, N/V, fatigue.   Objective: Vital signs in last 24 hours: Temp:  [97.4 F (36.3 C)-98.7 F (37.1 C)] 98 F (36.7 C) (07/07 1045) Pulse Rate:  [80-105] 96 (07/07 1130) Resp:  [9-20] 12 (07/07 1100) BP: (98-142)/(60-78) 107/68 (07/07 1130) SpO2:  [98 %-100 %] 100 % (07/07 1100) Weight:  [196 lb 3.4 oz (89 kg)-199 lb 4.7 oz (90.4 kg)] 199 lb 4.7 oz (90.4 kg) (07/07 1045) Last BM Date: 05/01/17 General:   Alert and oriented, pleasant Abdomen:  Bowel sounds present, soft, non-tender, non-distended.  Extremities:  Without  edema. Neurologic:  Alert and  oriented to person  Psych:  Alert and cooperative. Normal mood and affect.  Intake/Output from previous day: 07/06 0701 - 07/07 0700 In: 870 [P.O.:120; Blood:700; IV Piggyback:50] Out: 300 [Urine:300] Intake/Output this shift: No intake/output data recorded.  Lab Results:  Recent Labs  05/01/17 0440 05/01/17 0812 05/01/17 1541 05/02/17 0436  WBC 8.5  --  9.9 6.9  HGB 5.8* 5.5* 9.0* 6.9*  HCT 17.5* 17.0* 26.6* 20.5*  PLT 155  --  140* 149*   BMET  Recent Labs  05/01/17 0440 05/02/17 0436  NA 135 138  K 4.4 4.1  CL 99* 103  CO2 28 26  GLUCOSE 136* 103*  BUN 51* 61*  CREATININE 5.61* 6.16*  CALCIUM 7.9* 8.0*   LFT  Recent Labs  05/01/17 0440 05/02/17 0436  ALBUMIN 2.9* 2.9*   PT/INR  Recent Labs  04/30/17 0630 05/01/17 0440  LABPROT 14.1 15.5*  INR 1.09 1.22    Studies/Results: US Venous Img Lower Unilateral Right  Result Date: 05/01/2017 CLINICAL DATA:  History of deep venous thrombosis. Right lower extremity swelling and warmth. EXAM: RIGHT LOWER EXTREMITY VENOUS DOPPLER ULTRASOUND TECHNIQUE: Gray-scale sonography with graded compression, as well as color Doppler and duplex ultrasound were performed to evaluate the lower  extremity deep venous systems from the level of the common femoral vein and including the common femoral, femoral, profunda femoral, popliteal and calf veins including the posterior tibial, peroneal and gastrocnemius veins when visible. The superficial great saphenous vein was also interrogated. Spectral Doppler was utilized to evaluate flow at rest and with distal augmentation maneuvers in the common femoral, femoral and popliteal veins. COMPARISON:  None. FINDINGS: Contralateral Common Femoral Vein: Respiratory phasicity is normal and symmetric with the symptomatic side. No evidence of thrombus. Normal compressibility. Common Femoral Vein: No evidence of thrombus. Normal compressibility, respiratory phasicity and response to augmentation. Saphenofemoral Junction: No evidence of thrombus. Normal compressibility and flow on color Doppler imaging. Profunda Femoral Vein: No evidence of thrombus. Normal compressibility and flow on color Doppler imaging. Femoral Vein: No evidence of thrombus. Normal compressibility, respiratory phasicity and response to augmentation. Popliteal Vein: No evidence of thrombus. Normal compressibility, respiratory phasicity and response to augmentation. Calf Veins: No evidence of thrombus. Normal compressibility and flow on color Doppler imaging. Superficial Great Saphenous Vein: No evidence of thrombus. Normal compressibility and flow on color Doppler imaging. Venous Reflux:  None. Other Findings:  None. IMPRESSION: No evidence of DVT within the right lower extremity. Electronically Signed   By: Lajean Manes M.D.   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  Drop in Hgb again noted with additional PRBCs ordered by attending. Will be best served with subtotal colectomy.  Undergoing dialysis today with blood transfusion. Surgery consult placed. No need for endoscopic intervention currently but will continue to follow clinically..   Plan: Follow H/H Monitor for overt bleeding Surgery consult for colectomy   Annitta Needs, PhD, ANP-BC [facility]    LOS: 1 day    05/02/2017, 11:50 AM

## 2017-05-02 NOTE — Progress Notes (Signed)
Subjective: Interval History: has no complaint of nausea or vomiting. Patient also denies any abdominal pain..  Objective: Vital signs in last 24 hours: Temp:  [97.4 F (36.3 C)-98.7 F (37.1 C)] 98.5 F (36.9 C) (07/07 0747) Pulse Rate:  [80-105] 105 (07/07 0800) Resp:  [9-25] 16 (07/07 0800) BP: (93-142)/(55-78) 117/78 (07/07 0800) SpO2:  [98 %-100 %] 100 % (07/07 0800) Weight:  [89 kg (196 lb 3.4 oz)-90.2 kg (198 lb 13.7 oz)] 90.2 kg (198 lb 13.7 oz) (07/07 0400) Weight change:   Intake/Output from previous day: 07/06 0701 - 07/07 0700 In: 870 [P.O.:120; Blood:700; IV Piggyback:50] Out: 300 [Urine:300] Intake/Output this shift: No intake/output data recorded.  Generally patient is alert tender no apparent distress Chest is clear to auscultation Heart exam regular rate and rhythm no murmur Extremities no edema  Lab Results:  Recent Labs  05/01/17 1541 05/02/17 0436  WBC 9.9 6.9  HGB 9.0* 6.9*  HCT 26.6* 20.5*  PLT 140* 149*   BMET:  Recent Labs  05/01/17 0440 05/02/17 0436  NA 135 138  K 4.4 4.1  CL 99* 103  CO2 28 26  GLUCOSE 136* 103*  BUN 51* 61*  CREATININE 5.61* 6.16*  CALCIUM 7.9* 8.0*   No results for input(s): PTH in the last 72 hours. Iron Studies:  Recent Labs  05/01/17 0812  IRON 34*  TIBC 231*  FERRITIN 85    Studies/Results: US Venous Img Lower Unilateral Right  Result Date: 05/01/2017 CLINICAL DATA:  History of deep venous thrombosis. Right lower extremity swelling and warmth. EXAM: RIGHT LOWER EXTREMITY VENOUS DOPPLER ULTRASOUND TECHNIQUE: Gray-scale sonography with graded compression, as well as color Doppler and duplex ultrasound were performed to evaluate the lower extremity deep venous systems from the level of the common femoral vein and including the common femoral, femoral, profunda femoral, popliteal and calf veins including the posterior tibial, peroneal and gastrocnemius veins when visible. The superficial great saphenous  vein was also interrogated. Spectral Doppler was utilized to evaluate flow at rest and with distal augmentation maneuvers in the common femoral, femoral and popliteal veins. COMPARISON:  None. FINDINGS: Contralateral Common Femoral Vein: Respiratory phasicity is normal and symmetric with the symptomatic side. No evidence of thrombus. Normal compressibility. Common Femoral Vein: No evidence of thrombus. Normal compressibility, respiratory phasicity and response to augmentation. Saphenofemoral Junction: No evidence of thrombus. Normal compressibility and flow on color Doppler imaging. Profunda Femoral Vein: No evidence of thrombus. Normal compressibility and flow on color Doppler imaging. Femoral Vein: No evidence of thrombus. Normal compressibility, respiratory phasicity and response to augmentation. Popliteal Vein: No evidence of thrombus. Normal compressibility, respiratory phasicity and response to augmentation. Calf Veins: No evidence of thrombus. Normal compressibility and flow on color Doppler imaging. Superficial Great Saphenous Vein: No evidence of thrombus. Normal compressibility and flow on color Doppler imaging. Venous Reflux:  None. Other Findings:  None. IMPRESSION: No evidence of DVT within the right lower extremity. Electronically Signed   By: Lajean Manes M.D.   On: 05/01/2017 14:27    I have reviewed the patient's current medications.  Assessment/Plan: Problem #1. Anemia: Secondary to GI bleeding. His hemoglobin has declined today. Patient has received 2 units of pack red blood cells yesterday. Presently being followed by GI. Problem #2 end-stage renal disease: He is status post hemodialysis on Thursday. Presently patient is asymptomatic and potassium is normal. Patient is due for dialysis today Problem #3 bone and metabolic disorder: His calcium is range but phosphorus is high normal 5.5.  Problem #4 hypertension: His blood pressure is reasonably controlled  Problem #5 fluid management:  Patient doesn't have any sign and symptom of fluid overload. Problem #6 history of DVT: He is off Coumadin because of his GI bleeding Problem #7 history of obstructive uropathy Plan: We'll make arrangements for patient to get dialysis today 2] we'll hold heparin 3] we'll transfuse patient on dialysis since he has blood on hold. 4] we'll check his CBC and renal panel in the morning.   LOS: 1 day   Cassey Hurrell S 05/02/2017,8:31 AM

## 2017-05-02 NOTE — Consult Note (Signed)
Patient seen, chart reviewed.  Full consult to follow.

## 2017-05-02 NOTE — Progress Notes (Signed)
hgb of 6.9 called by lab at 0620. MD on call paged.

## 2017-05-03 LAB — HEMOGLOBIN AND HEMATOCRIT, BLOOD
HCT: 32.7 % — ABNORMAL LOW (ref 39.0–52.0)
Hemoglobin: 10.9 g/dL — ABNORMAL LOW (ref 13.0–17.0)

## 2017-05-03 LAB — RENAL FUNCTION PANEL
Albumin: 3.2 g/dL — ABNORMAL LOW (ref 3.5–5.0)
Anion gap: 9 (ref 5–15)
BUN: 38 mg/dL — ABNORMAL HIGH (ref 6–20)
CO2: 28 mmol/L (ref 22–32)
Calcium: 8.4 mg/dL — ABNORMAL LOW (ref 8.9–10.3)
Chloride: 100 mmol/L — ABNORMAL LOW (ref 101–111)
Creatinine, Ser: 4.28 mg/dL — ABNORMAL HIGH (ref 0.61–1.24)
GFR calc Af Amer: 15 mL/min — ABNORMAL LOW (ref >60)
GFR calc non Af Amer: 13 mL/min — ABNORMAL LOW (ref >60)
Glucose, Bld: 123 mg/dL — ABNORMAL HIGH (ref 65–99)
Phosphorus: 5.1 mg/dL — ABNORMAL HIGH (ref 2.5–4.6)
Potassium: 3.4 mmol/L — ABNORMAL LOW (ref 3.5–5.1)
Sodium: 137 mmol/L (ref 135–145)

## 2017-05-03 LAB — PREPARE RBC (CROSSMATCH)

## 2017-05-03 LAB — CBC
HCT: 28.1 % — ABNORMAL LOW (ref 39.0–52.0)
Hemoglobin: 9.7 g/dL — ABNORMAL LOW (ref 13.0–17.0)
MCH: 31.9 pg (ref 26.0–34.0)
MCHC: 34.5 g/dL (ref 30.0–36.0)
MCV: 92.4 fL (ref 78.0–100.0)
Platelets: 154 10*3/uL (ref 150–400)
RBC: 3.04 MIL/uL — ABNORMAL LOW (ref 4.22–5.81)
RDW: 18.7 % — ABNORMAL HIGH (ref 11.5–15.5)
WBC: 7.3 10*3/uL (ref 4.0–10.5)

## 2017-05-03 MED ORDER — SODIUM CHLORIDE 0.9 % IV SOLN
Freq: Once | INTRAVENOUS | Status: AC
  Administered 2017-05-03: 11:00:00 via INTRAVENOUS

## 2017-05-03 MED ORDER — PEG 3350-KCL-NA BICARB-NACL 420 G PO SOLR
4000.0000 mL | Freq: Once | ORAL | Status: AC
  Administered 2017-05-03: 4000 mL via ORAL
  Filled 2017-05-03: qty 4000

## 2017-05-03 MED ORDER — SODIUM CHLORIDE 0.9 % IV SOLN: Freq: Once | INTRAVENOUS | Status: AC

## 2017-05-03 MED ORDER — POTASSIUM CHLORIDE CRYS ER 20 MEQ PO TBCR
20.0000 meq | EXTENDED_RELEASE_TABLET | Freq: Once | ORAL | Status: AC
  Administered 2017-05-03: 20 meq via ORAL
  Filled 2017-05-03: qty 1

## 2017-05-03 MED ORDER — CHLORHEXIDINE GLUCONATE CLOTH 2 % EX PADS: 6.0000 | MEDICATED_PAD | Freq: Once | CUTANEOUS | Status: AC

## 2017-05-03 MED ORDER — CHLORHEXIDINE GLUCONATE CLOTH 2 % EX PADS
6.0000 | MEDICATED_PAD | Freq: Once | CUTANEOUS | Status: AC
  Administered 2017-05-03: 6 via TOPICAL

## 2017-05-03 NOTE — Progress Notes (Signed)
REVIEWED-NO ADDITIONAL RECOMMENDATIONS. 

## 2017-05-03 NOTE — Progress Notes (Signed)
Subjective: Remains without overt bleeding. No abdominal pain, N/V.   Objective: Vital signs in last 24 hours: Temp:  [97.6 F (36.4 C)-98.4 F (36.9 C)] 98.4 F (36.9 C) (07/08 0730) Pulse Rate:  [78-105] 86 (07/08 0730) Resp:  [9-16] 13 (07/08 0730) BP: (94-128)/(60-78) 113/66 (07/07 1800) SpO2:  [100 %] 100 % (07/08 0730) Weight:  [188 lb 11.4 oz (85.6 kg)-199 lb 4.7 oz (90.4 kg)] 188 lb 11.4 oz (85.6 kg) (07/08 0500) Last BM Date: 05/02/17 General:   Alert and oriented to person, pleasant Head:  Normocephalic and atraumatic. Eyes:  No icterus, sclera clear. Conjuctiva pink.  Mouth:  Without lesions, mucosa pink and moist.  Abdomen:  Bowel sounds present, soft, non-tender, non-distended.  Neurologic:  Alert and  oriented to person Psych:   Normal mood and affect.  Intake/Output from previous day: 07/07 0701 - 07/08 0700 In: 670 [Blood:670] Out: 2230 [Urine:200] Intake/Output this shift: No intake/output data recorded.  Lab Results:  Recent Labs  05/01/17 1541 05/02/17 0436 05/03/17 0335  WBC 9.9 6.9 7.3  HGB 9.0* 6.9* 9.7*  HCT 26.6* 20.5* 28.1*  PLT 140* 149* 154   BMET  Recent Labs  05/01/17 0440 05/02/17 0436 05/03/17 0336  NA 135 138 137  K 4.4 4.1 3.4*  CL 99* 103 100*  CO2 28 26 28   GLUCOSE 136* 103* 123*  BUN 51* 61* 38*  CREATININE 5.61* 6.16* 4.28*  CALCIUM 7.9* 8.0* 8.4*   LFT  Recent Labs  05/01/17 0440 05/02/17 0436 05/03/17 0336  ALBUMIN 2.9* 2.9* 3.2*   PT/INR  Recent Labs  05/01/17 0440  LABPROT 15.5*  INR 1.22     Studies/Results: US Venous Img Lower Unilateral Right  Result Date: 05/01/2017 CLINICAL DATA:  History of deep venous thrombosis. Right lower extremity swelling and warmth. EXAM: RIGHT LOWER EXTREMITY VENOUS DOPPLER ULTRASOUND TECHNIQUE: Gray-scale sonography with graded compression, as well as color Doppler and duplex ultrasound were performed to evaluate the lower extremity deep venous systems from the  level of the common femoral vein and including the common femoral, femoral, profunda femoral, popliteal and calf veins including the posterior tibial, peroneal and gastrocnemius veins when visible. The superficial great saphenous vein was also interrogated. Spectral Doppler was utilized to evaluate flow at rest and with distal augmentation maneuvers in the common femoral, femoral and popliteal veins. COMPARISON:  None. FINDINGS: Contralateral Common Femoral Vein: Respiratory phasicity is normal and symmetric with the symptomatic side. No evidence of thrombus. Normal compressibility. Common Femoral Vein: No evidence of thrombus. Normal compressibility, respiratory phasicity and response to augmentation. Saphenofemoral Junction: No evidence of thrombus. Normal compressibility and flow on color Doppler imaging. Profunda Femoral Vein: No evidence of thrombus. Normal compressibility and flow on color Doppler imaging. Femoral Vein: No evidence of thrombus. Normal compressibility, respiratory phasicity and response to augmentation. Popliteal Vein: No evidence of thrombus. Normal compressibility, respiratory phasicity and response to augmentation. Calf Veins: No evidence of thrombus. Normal compressibility and flow on color Doppler imaging. Superficial Great Saphenous Vein: No evidence of thrombus. Normal compressibility and flow on color Doppler imaging. Venous Reflux:  None. Other Findings:  None. IMPRESSION: No evidence of DVT within the right lower extremity. Electronically Signed   By: Lajean Manes M.D.   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 Transfusion yesterday with  improvement in Hgb to 9.7. He is without overt GI bleeding. Will be best served with subtotal colectomy. Undergoing dialysis today  with blood transfusion. Surgery is aware of consult per nursing staff. No need for endoscopic intervention currently but will continue to follow clinically. Will advance diet to low residue.    Plan: Low residue diet Continue serial monitoring of H/H Transfuse as needed Surgery to see patient in future Will continue to follow clinically  Annitta Needs, PhD, ANP-BC [facility]    LOS: 2 days    05/03/2017, 7:59 AM

## 2017-05-03 NOTE — Progress Notes (Signed)
Attempted to call Zachary [name] (Legal Guardian) twice for consent for total colectomy to be done on 05/04/17. The number listed is her office number at social services, which is closed today, attempted to call emergency number and the phone continuously rings. Will ask night shift primary nurse to have day shift make another attempt in the am. Dr. Arnoldo Morale made aware of the unsuccessful attempts today.

## 2017-05-03 NOTE — Consult Note (Signed)
Reason for Consult: Dysplastic polyps of colon, anemia Referring Physician: [name] is an 71 y.o. male.  HPI: Patient is a 71 year old black male multiple medical problems including mental retardation, end-stage renal disease, and hypertension who underwent colonoscopy on 04/27/2017 and was found to have multiple tubular adenomas with several been being high-grade dysplasia. One was in the ascending colon and the other one was in the descending colon. They are otherwise scattered throughout the colon. He has had lower GI bleeding which required multiple blood transfusions. This has since resolved. Due to the pathologic findings, I have been consulted for a subtotal colectomy. History is limited from the patient due to his retardation. He is a ward of the state.  Past Medical History:  Diagnosis Date  . Abnormal CT scan, kidney 10/06/2011  . Acute pyelonephritis 10/07/2011  . Anemia    normocytic  . Anxiety    mental retardation  . Bladder wall thickening 10/06/2011  . BPH (benign prostatic hypertrophy)   . Diabetes mellitus   . Edema     history of lower extremity edema  . Heme positive stool   . Hydronephrosis   . Hyperkalemia   . Hyperlipidemia   . Hypernatremia   . Hypertension   . Hypothyroidism   . Impaired speech   . 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 MR (mental retardation)   . Obstructive uropathy   . 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 Poor historian poor historian  . Protein calorie malnutrition ([place])   . Pyelonephritis   . Renal failure (ARF), acute on chronic (HCC)   . Renal insufficiency    chronic history  . Smoking   . Uremia   . Urinary retention   . UTI (lower urinary tract infection) 10/06/2011  . UTI (lower urinary tract infection)     Past Surgical History:  Procedure Laterality Date  . AV FISTULA PLACEMENT Left 07/06/2015   Procedure:  INSERTION LEFT ARM ARTERIOVENOUS GORTEX GRAFT;  Surgeon: Angelia Mould, MD;   Location: [place];  Service: Vascular;  Laterality: Left;  . AV FISTULA PLACEMENT Right 02/26/2016   Procedure: ARTERIOVENOUS (AV) FISTULA CREATION ;  Surgeon: Angelia Mould, MD;  Location: [place];  Service: Vascular;  Laterality: Right;  . [place] REMOVAL Left 10/09/2015   Procedure: REMOVAL OF ARTERIOVENOUS GORETEX GRAFT ([place]) Evacuation of Lymphocele, Vein Patch angioplasty of brachial artery.;  Surgeon: Angelia Mould, MD;  Location: [place];  Service: Vascular;  Laterality: Left;  . BASCILIC VEIN TRANSPOSITION Right 02/26/2016   Procedure: Right BASCILIC VEIN TRANSPOSITION;  Surgeon: Angelia Mould, MD;  Location: [place];  Service: Vascular;  Laterality: Right;  . CIRCUMCISION N/A 01/04/2014   Procedure: CIRCUMCISION ADULT (procedure #1);  Surgeon: Marissa Nestle, MD;  Location: [facility];  Service: Urology;  Laterality: N/A;  . CYSTOSCOPY W/ RETROGRADES Bilateral 06/29/2015   Procedure: CYSTOSCOPY, DILATION OF URETHRAL STRICTURE WITH BILATERAL RETROGRADE PYELOGRAM,SUPRAPUBIC TUBE CHANGE;  Surgeon: Festus Aloe, MD;  Location: [facility];  Service: Urology;  Laterality: Bilateral;  . CYSTOSCOPY WITH URETHRAL DILATATION N/A 12/29/2013   Procedure: CYSTOSCOPY WITH URETHRAL DILATATION;  Surgeon: Marissa Nestle, MD;  Location: [facility];  Service: Urology;  Laterality: N/A;  . ORIF FEMUR FRACTURE Right 11/22/2016   Procedure: OPEN REDUCTION INTERNAL FIXATION (ORIF) DISTAL FEMUR FRACTURE;  Surgeon: Rod Can, MD;  Location: [place];  Service: Orthopedics;  Laterality: Right;  . PERIPHERAL VASCULAR CATHETERIZATION N/A 10/08/2015   Procedure: A/V Shuntogram;  Surgeon: Angelia Mould, MD;  Location: [place] [facility];  Service: Cardiovascular;  Laterality: N/A;  . TRANSURETHRAL RESECTION OF PROSTATE N/A 01/04/2014   Procedure: TRANSURETHRAL RESECTION OF THE PROSTATE (TURP) (procedure #2);  Surgeon: Marissa Nestle, MD;  Location: [facility];  Service: Urology;  Laterality: N/A;     Family History  Problem Relation Age of Onset  . Cancer Mother   . Colon cancer Neg Hx     Social History:  reports that he has never smoked. He has never used smokeless tobacco. He reports that he does not drink alcohol or use drugs.  Allergies: No Known Allergies  Medications:  Scheduled: . atorvastatin  40 mg Oral QPM  . Chlorhexidine Gluconate Cloth  6 each Topical Q0600  . feeding supplement (PRO-STAT 64)  30 mL Oral BID BM  . levothyroxine  75 mcg Oral QAC breakfast  . multivitamin with minerals  1 tablet Oral QPM  . mupirocin ointment  1 application Nasal BID  . pantoprazole  80 mg Oral Daily  . tamsulosin  0.4 mg Oral Q2000  . torsemide  50 mg Oral Daily    Results for orders placed or performed during the hospital encounter of 05/01/17 (from the past 48 hour(s))  MRSA PCR Screening     Status: Abnormal   Collection Time: 05/01/17 11:22 AM  Result Value Ref Range   MRSA by PCR POSITIVE (A) NEGATIVE    Comment:        The GeneXpert MRSA Assay (FDA approved for NASAL specimens only), is one component of a comprehensive MRSA colonization surveillance program. It is not intended to diagnose MRSA infection nor to guide or monitor treatment for MRSA infections. RESULT CALLED TO, READ BACK BY AND VERIFIED WITH: HYLTON L. AT 1406 ON [id number] BY THOMPSON S.   Prepare RBC     Status: None   Collection Time: 05/01/17 11:39 AM  Result Value Ref Range   Order Confirmation ORDER PROCESSED BY BLOOD BANK   CBC     Status: Abnormal   Collection Time: 05/01/17  3:41 PM  Result Value Ref Range   WBC 9.9 4.0 - 10.5 K/uL   RBC 2.80 (L) 4.22 - 5.81 MIL/uL   Hemoglobin 9.0 (L) 13.0 - 17.0 g/dL    Comment: DELTA CHECK NOTED POST TRANSFUSION SPECIMEN    HCT 26.6 (L) 39.0 - 52.0 %   MCV 95.0 78.0 - 100.0 fL    Comment: DELTA CHECK NOTED POST TRANSFUSION SPECIMEN    MCH 32.1 26.0 - 34.0 pg   MCHC 33.8 30.0 - 36.0 g/dL   RDW 18.9 (H) 11.5 - 15.5 %   Platelets 140 (L) 150  - 400 K/uL  CBC     Status: Abnormal   Collection Time: 05/02/17  4:36 AM  Result Value Ref Range   WBC 6.9 4.0 - 10.5 K/uL   RBC 2.16 (L) 4.22 - 5.81 MIL/uL   Hemoglobin 6.9 (LL) 13.0 - 17.0 g/dL    Comment: REPEATED TO VERIFY DELTA CHECK NOTED CRITICAL RESULT CALLED TO, READ BACK BY AND VERIFIED WITH: WAGONER,R RN AT 3154 BY HFLYNT 05/02/17    HCT 20.5 (L) 39.0 - 52.0 %   MCV 94.9 78.0 - 100.0 fL   MCH 31.9 26.0 - 34.0 pg   MCHC 33.7 30.0 - 36.0 g/dL   RDW 19.9 (H) 11.5 - 15.5 %   Platelets 149 (L) 150 - 400 K/uL  Renal function panel     Status: Abnormal  Collection Time: 05/02/17  4:36 AM  Result Value Ref Range   Sodium 138 135 - 145 mmol/L   Potassium 4.1 3.5 - 5.1 mmol/L   Chloride 103 101 - 111 mmol/L   CO2 26 22 - 32 mmol/L   Glucose, Bld 103 (H) 65 - 99 mg/dL   BUN 61 (H) 6 - 20 mg/dL   Creatinine, Ser 6.16 (H) 0.61 - 1.24 mg/dL   Calcium 8.0 (L) 8.9 - 10.3 mg/dL   Phosphorus 5.5 (H) 2.5 - 4.6 mg/dL   Albumin 2.9 (L) 3.5 - 5.0 g/dL   GFR calc non Af Amer 8 (L) >60 mL/min   GFR calc Af Amer 10 (L) >60 mL/min    Comment: (NOTE) The eGFR has been calculated using the CKD EPI equation. This calculation has not been validated in all clinical situations. eGFR's persistently <60 mL/min signify possible Chronic Kidney Disease.    Anion gap 9 5 - 15  CBC     Status: Abnormal   Collection Time: 05/03/17  3:35 AM  Result Value Ref Range   WBC 7.3 4.0 - 10.5 K/uL   RBC 3.04 (L) 4.22 - 5.81 MIL/uL   Hemoglobin 9.7 (L) 13.0 - 17.0 g/dL    Comment: DELTA CHECK NOTED POST TRANSFUSION SPECIMEN    HCT 28.1 (L) 39.0 - 52.0 %   MCV 92.4 78.0 - 100.0 fL   MCH 31.9 26.0 - 34.0 pg   MCHC 34.5 30.0 - 36.0 g/dL   RDW 18.7 (H) 11.5 - 15.5 %   Platelets 154 150 - 400 K/uL  Renal function panel     Status: Abnormal   Collection Time: 05/03/17  3:36 AM  Result Value Ref Range   Sodium 137 135 - 145 mmol/L   Potassium 3.4 (L) 3.5 - 5.1 mmol/L    Comment: DELTA CHECK NOTED    Chloride 100 (L) 101 - 111 mmol/L   CO2 28 22 - 32 mmol/L   Glucose, Bld 123 (H) 65 - 99 mg/dL   BUN 38 (H) 6 - 20 mg/dL   Creatinine, Ser 4.28 (H) 0.61 - 1.24 mg/dL   Calcium 8.4 (L) 8.9 - 10.3 mg/dL   Phosphorus 5.1 (H) 2.5 - 4.6 mg/dL   Albumin 3.2 (L) 3.5 - 5.0 g/dL   GFR calc non Af Amer 13 (L) >60 mL/min   GFR calc Af Amer 15 (L) >60 mL/min    Comment: (NOTE) The eGFR has been calculated using the CKD EPI equation. This calculation has not been validated in all clinical situations. eGFR's persistently <60 mL/min signify possible Chronic Kidney Disease.    Anion gap 9 5 - 15    US Venous Img Lower Unilateral Right  Result Date: 05/01/2017 CLINICAL DATA:  History of deep venous thrombosis. Right lower extremity swelling and warmth. EXAM: RIGHT LOWER EXTREMITY VENOUS DOPPLER ULTRASOUND TECHNIQUE: Gray-scale sonography with graded compression, as well as color Doppler and duplex ultrasound were performed to evaluate the lower extremity deep venous systems from the level of the common femoral vein and including the common femoral, femoral, profunda femoral, popliteal and calf veins including the posterior tibial, peroneal and gastrocnemius veins when visible. The superficial great saphenous vein was also interrogated. Spectral Doppler was utilized to evaluate flow at rest and with distal augmentation maneuvers in the common femoral, femoral and popliteal veins. COMPARISON:  None. FINDINGS: Contralateral Common Femoral Vein: Respiratory phasicity is normal and symmetric with the symptomatic side. No evidence of thrombus. Normal compressibility. Common Femoral  Vein: No evidence of thrombus. Normal compressibility, respiratory phasicity and response to augmentation. Saphenofemoral Junction: No evidence of thrombus. Normal compressibility and flow on color Doppler imaging. Profunda Femoral Vein: No evidence of thrombus. Normal compressibility and flow on color Doppler imaging. Femoral Vein: No  evidence of thrombus. Normal compressibility, respiratory phasicity and response to augmentation. Popliteal Vein: No evidence of thrombus. Normal compressibility, respiratory phasicity and response to augmentation. Calf Veins: No evidence of thrombus. Normal compressibility and flow on color Doppler imaging. Superficial Great Saphenous Vein: No evidence of thrombus. Normal compressibility and flow on color Doppler imaging. Venous Reflux:  None. Other Findings:  None. IMPRESSION: No evidence of DVT within the right lower extremity. Electronically Signed   By: Lajean Manes M.D.   On: 05/01/2017 14:27    ROS:  Review of systems not obtained due to patient factors.  Blood pressure 107/68, pulse 75, temperature 98.4 F (36.9 C), temperature source Oral, resp. rate 10, height 6' (1.829 m), weight 188 lb 11.4 oz (85.6 kg), SpO2 100 %. Physical Exam: Pleasant black male in no acute distress. Head is normocephalic, atraumatic Neck is supple Lungs clear auscultation with breath sounds bilaterally Heart examination reveals a regular rate and rhythm without S3 or S4 or murmurs Abdomen soft, nontender, nondistended. No hepatosplenomegaly, masses. 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 Will contact patient's social worker as he is a ward of the state. Transfuse 1 unit of packed red blood cells. Preoperative orders written.  Aviva Signs 05/03/2017, 11:18 AM

## 2017-05-03 NOTE — Progress Notes (Signed)
Subjective: He says he feels better. He has no complaints except he is hungry. No overt bleeding. He received  packed red blood cells during dialysis  Objective: Vital signs in last 24 hours: Temp:  [97.6 F (36.4 C)-98.5 F (36.9 C)] 98.2 F (36.8 C) (07/08 0400) Pulse Rate:  [78-105] 87 (07/07 1800) Resp:  [9-16] 12 (07/07 1800) BP: (94-128)/(60-78) 113/66 (07/07 1800) SpO2:  [100 %] 100 % (07/07 1800) Weight:  [85.6 kg (188 lb 11.4 oz)-90.4 kg (199 lb 4.7 oz)] 85.6 kg (188 lb 11.4 oz) (07/08 0500) Weight change: 11.9 kg (26 lb 4.7 oz) Last BM Date: 05/02/17  Intake/Output from previous day: 07/07 0701 - 07/08 0700 In: 670 [Blood:670] Out: 2230 [Urine:200]  PHYSICAL EXAM General appearance: alert, cooperative and no distress Resp: clear to auscultation bilaterally Cardio: regular rate and rhythm, S1, S2 normal, no murmur, click, rub or gallop GI: soft, non-tender; bowel sounds normal; no masses,  no organomegaly Extremities: extremities normal, atraumatic, no cyanosis or edema Skin warm and dry  Lab Results:  Results for orders placed or performed during the hospital encounter of 05/01/17 (from the past 48 hour(s))  Type and screen [facility]     Status: None   Collection Time: 05/01/17  8:12 AM  Result Value Ref Range   ABO/RH(D) B POS    Antibody Screen NEG    Sample Expiration 05/04/2017    Unit Number [id number]    Blood Component Type RED CELLS,LR    Unit division 00    Status of Unit ISSUED,FINAL    Transfusion Status OK TO TRANSFUSE    Crossmatch Result Compatible    Unit Number [id number]    Blood Component Type RED CELLS,LR    Unit division 00    Status of Unit ISSUED,FINAL    Transfusion Status OK TO TRANSFUSE    Crossmatch Result Compatible    Unit Number [id number]    Blood Component Type RED CELLS,LR    Unit division 00    Status of Unit ISSUED,FINAL    Transfusion Status OK TO TRANSFUSE    Crossmatch Result Compatible    Unit  Number [id number]    Blood Component Type RED CELLS,LR    Unit division 00    Status of Unit ISSUED,FINAL    Transfusion Status OK TO TRANSFUSE    Crossmatch Result Compatible   Vitamin B12     Status: None   Collection Time: 05/01/17  8:12 AM  Result Value Ref Range   Vitamin B-12 533 180 - 914 pg/mL    Comment: (NOTE) This assay is not validated for testing neonatal or myeloproliferative syndrome specimens for Vitamin B12 levels. Performed at [facility], [place] [address] [place], [place] [address]   Iron and TIBC     Status: Abnormal   Collection Time: 05/01/17  8:12 AM  Result Value Ref Range   Iron 34 (L) 45 - 182 ug/dL   TIBC 231 (L) 250 - 450 ug/dL   Saturation Ratios 15 (L) 17.9 - 39.5 %   UIBC 197 ug/dL    Comment: Performed at [facility], [place] [address], [place], [place] [address]  Ferritin     Status: None   Collection Time: 05/01/17  8:12 AM  Result Value Ref Range   Ferritin 85 24 - 336 ng/mL    Comment: Performed at [facility], [place] [address] [place], [place] [address]  Reticulocytes     Status: Abnormal  Collection Time: 05/01/17  8:12 AM  Result Value Ref Range   Retic Ct Pct 7.5 (H) 0.4 - 3.1 %   RBC. 1.64 (L) 4.22 - 5.81 MIL/uL   Retic Count, Absolute 123.0 19.0 - 186.0 K/uL  Lactate dehydrogenase     Status: None   Collection Time: 05/01/17  8:12 AM  Result Value Ref Range   LDH 104 98 - 192 U/L  Hemoglobin and hematocrit, blood     Status: Abnormal   Collection Time: 05/01/17  8:12 AM  Result Value Ref Range   Hemoglobin 5.5 (LL) 13.0 - 17.0 g/dL    Comment: REPEATED TO VERIFY CRITICAL RESULT CALLED TO, READ BACK BY AND VERIFIED WITH: EDWARDS,C AT 0835 BY HFLYNT 05/01/17    HCT 17.0 (L) 39.0 - 52.0 %  Folate     Status: None   Collection Time: 05/01/17  8:16 AM  Result Value Ref Range   Folate 56.7 >5.9 ng/mL    Comment: RESULTS CONFIRMED BY MANUAL DILUTION Performed at [facility], [place] [address],  [place], [place] [address]   POC CBG, ED     Status: Abnormal   Collection Time: 05/01/17  8:20 AM  Result Value Ref Range   Glucose-Capillary 151 (H) 65 - 99 mg/dL  Prepare RBC     Status: None   Collection Time: 05/01/17  9:00 AM  Result Value Ref Range   Order Confirmation ORDER PROCESSED BY BLOOD BANK   MRSA PCR Screening     Status: Abnormal   Collection Time: 05/01/17 11:22 AM  Result Value Ref Range   MRSA by PCR POSITIVE (A) NEGATIVE    Comment:        The GeneXpert MRSA Assay (FDA approved for NASAL specimens only), is one component of a comprehensive MRSA colonization surveillance program. It is not intended to diagnose MRSA infection nor to guide or monitor treatment for MRSA infections. RESULT CALLED TO, READ BACK BY AND VERIFIED WITH: HYLTON L. AT 1406 ON [id number] BY THOMPSON S.   Prepare RBC     Status: None   Collection Time: 05/01/17 11:39 AM  Result Value Ref Range   Order Confirmation ORDER PROCESSED BY BLOOD BANK   CBC     Status: Abnormal   Collection Time: 05/01/17  3:41 PM  Result Value Ref Range   WBC 9.9 4.0 - 10.5 K/uL   RBC 2.80 (L) 4.22 - 5.81 MIL/uL   Hemoglobin 9.0 (L) 13.0 - 17.0 g/dL    Comment: DELTA CHECK NOTED POST TRANSFUSION SPECIMEN    HCT 26.6 (L) 39.0 - 52.0 %   MCV 95.0 78.0 - 100.0 fL    Comment: DELTA CHECK NOTED POST TRANSFUSION SPECIMEN    MCH 32.1 26.0 - 34.0 pg   MCHC 33.8 30.0 - 36.0 g/dL   RDW 18.9 (H) 11.5 - 15.5 %   Platelets 140 (L) 150 - 400 K/uL  CBC     Status: Abnormal   Collection Time: 05/02/17  4:36 AM  Result Value Ref Range   WBC 6.9 4.0 - 10.5 K/uL   RBC 2.16 (L) 4.22 - 5.81 MIL/uL   Hemoglobin 6.9 (LL) 13.0 - 17.0 g/dL    Comment: REPEATED TO VERIFY DELTA CHECK NOTED CRITICAL RESULT CALLED TO, READ BACK BY AND VERIFIED WITH: WAGONER,R RN AT 9811 BY HFLYNT 05/02/17    HCT 20.5 (L) 39.0 - 52.0 %   MCV 94.9 78.0 - 100.0 fL   MCH 31.9 26.0 - 34.0 pg  MCHC 33.7 30.0 - 36.0 g/dL   RDW 19.9 (H) 11.5 - 15.5  %   Platelets 149 (L) 150 - 400 K/uL  Renal function panel     Status: Abnormal   Collection Time: 05/02/17  4:36 AM  Result Value Ref Range   Sodium 138 135 - 145 mmol/L   Potassium 4.1 3.5 - 5.1 mmol/L   Chloride 103 101 - 111 mmol/L   CO2 26 22 - 32 mmol/L   Glucose, Bld 103 (H) 65 - 99 mg/dL   BUN 61 (H) 6 - 20 mg/dL   Creatinine, Ser 6.16 (H) 0.61 - 1.24 mg/dL   Calcium 8.0 (L) 8.9 - 10.3 mg/dL   Phosphorus 5.5 (H) 2.5 - 4.6 mg/dL   Albumin 2.9 (L) 3.5 - 5.0 g/dL   GFR calc non Af Amer 8 (L) >60 mL/min   GFR calc Af Amer 10 (L) >60 mL/min    Comment: (NOTE) The eGFR has been calculated using the CKD EPI equation. This calculation has not been validated in all clinical situations. eGFR's persistently <60 mL/min signify possible Chronic Kidney Disease.    Anion gap 9 5 - 15  CBC     Status: Abnormal   Collection Time: 05/03/17  3:35 AM  Result Value Ref Range   WBC 7.3 4.0 - 10.5 K/uL   RBC 3.04 (L) 4.22 - 5.81 MIL/uL   Hemoglobin 9.7 (L) 13.0 - 17.0 g/dL    Comment: DELTA CHECK NOTED POST TRANSFUSION SPECIMEN    HCT 28.1 (L) 39.0 - 52.0 %   MCV 92.4 78.0 - 100.0 fL   MCH 31.9 26.0 - 34.0 pg   MCHC 34.5 30.0 - 36.0 g/dL   RDW 18.7 (H) 11.5 - 15.5 %   Platelets 154 150 - 400 K/uL  Renal function panel     Status: Abnormal   Collection Time: 05/03/17  3:36 AM  Result Value Ref Range   Sodium 137 135 - 145 mmol/L   Potassium 3.4 (L) 3.5 - 5.1 mmol/L    Comment: DELTA CHECK NOTED   Chloride 100 (L) 101 - 111 mmol/L   CO2 28 22 - 32 mmol/L   Glucose, Bld 123 (H) 65 - 99 mg/dL   BUN 38 (H) 6 - 20 mg/dL   Creatinine, Ser 4.28 (H) 0.61 - 1.24 mg/dL   Calcium 8.4 (L) 8.9 - 10.3 mg/dL   Phosphorus 5.1 (H) 2.5 - 4.6 mg/dL   Albumin 3.2 (L) 3.5 - 5.0 g/dL   GFR calc non Af Amer 13 (L) >60 mL/min   GFR calc Af Amer 15 (L) >60 mL/min    Comment: (NOTE) The eGFR has been calculated using the CKD EPI equation. This calculation has not been validated in all clinical  situations. eGFR's persistently <60 mL/min signify possible Chronic Kidney Disease.    Anion gap 9 5 - 15    ABGS No results for input(s): PHART, PO2ART, TCO2, HCO3 in the last 72 hours.  Invalid input(s): PCO2 CULTURES Recent Results (from the past 240 hour(s))  MRSA PCR Screening     Status: Abnormal   Collection Time: 05/01/17 11:22 AM  Result Value Ref Range Status   MRSA by PCR POSITIVE (A) NEGATIVE Final    Comment:        The GeneXpert MRSA Assay (FDA approved for NASAL specimens only), is one component of a comprehensive MRSA colonization surveillance program. It is not intended to diagnose MRSA infection nor to guide or monitor treatment for  MRSA infections. RESULT CALLED TO, READ BACK BY AND VERIFIED WITH: HYLTON L. AT 1406 ON [id number] BY THOMPSON S.    Studies/Results: US Venous Img Lower Unilateral Right  Result Date: 05/01/2017 CLINICAL DATA:  History of deep venous thrombosis. Right lower extremity swelling and warmth. EXAM: RIGHT LOWER EXTREMITY VENOUS DOPPLER ULTRASOUND TECHNIQUE: Gray-scale sonography with graded compression, as well as color Doppler and duplex ultrasound were performed to evaluate the lower extremity deep venous systems from the level of the common femoral vein and including the common femoral, femoral, profunda femoral, popliteal and calf veins including the posterior tibial, peroneal and gastrocnemius veins when visible. The superficial great saphenous vein was also interrogated. Spectral Doppler was utilized to evaluate flow at rest and with distal augmentation maneuvers in the common femoral, femoral and popliteal veins. COMPARISON:  None. FINDINGS: Contralateral Common Femoral Vein: Respiratory phasicity is normal and symmetric with the symptomatic side. No evidence of thrombus. Normal compressibility. Common Femoral Vein: No evidence of thrombus. Normal compressibility, respiratory phasicity and response to augmentation. Saphenofemoral Junction:  No evidence of thrombus. Normal compressibility and flow on color Doppler imaging. Profunda Femoral Vein: No evidence of thrombus. Normal compressibility and flow on color Doppler imaging. Femoral Vein: No evidence of thrombus. Normal compressibility, respiratory phasicity and response to augmentation. Popliteal Vein: No evidence of thrombus. Normal compressibility, respiratory phasicity and response to augmentation. Calf Veins: No evidence of thrombus. Normal compressibility and flow on color Doppler imaging. Superficial Great Saphenous Vein: No evidence of thrombus. Normal compressibility and flow on color Doppler imaging. Venous Reflux:  None. Other Findings:  None. IMPRESSION: No evidence of DVT within the right lower extremity. Electronically Signed   By: Lajean Manes M.D.   On: 05/01/2017 14:27    Medications:  Prior to Admission:  Prescriptions Prior to Admission  Medication Sig Dispense Refill Last Dose  . Amino Acids-Protein Hydrolys (FEEDING SUPPLEMENT, PRO-STAT 64,) LIQD Take 30 mLs by mouth 2 (two) times daily between meals.   04/30/2017 at Unknown time  . atorvastatin (LIPITOR) 40 MG tablet Take 40 mg by mouth every evening.   04/30/2017 at unkown  . levothyroxine (SYNTHROID, LEVOTHROID) 75 MCG tablet Take 75 mcg by mouth daily before breakfast.    05/01/2017 at Unknown time  . Multiple Vitamin (MULTIVITAMIN WITH MINERALS) TABS tablet Take 1 tablet by mouth every evening.    04/30/2017 at Unknown time  . Neo-Bacit-Poly-Lidocaine (FIRST AID PLUS LIDOCAINE EX) Apply 1 application topically Every Tuesday,Thursday,and Saturday with dialysis. Prior to dialysis   04/30/2017 at Unknown time  . Nutritional Supplements (NEPRO) LIQD Take 1 Can by mouth 2 (two) times daily. Take 1 can by mouth twice a day    04/30/2017 at Unknown time  . omeprazole (PRILOSEC) 40 MG capsule Take 40 mg by mouth every evening.   04/30/2017 at Unknown time  . sodium phosphate Pediatric (FLEET) 3.5-9.5 GM/59ML enema Place 1 enema  rectally once.   04/27/2017 at unkown  . tamsulosin (FLOMAX) 0.4 MG CAPS capsule Take 0.4 mg by mouth daily at 8 pm. Give 30 minutes after a meal   04/30/2017 at Unknown time  . torsemide (DEMADEX) 10 MG tablet Take 5 tablets (50 mg total) by mouth daily. 30 tablet 3 04/30/2017 at Unknown time  . warfarin (COUMADIN) 4 MG tablet Take 4 mg by mouth daily at 6 PM.    04/30/2017 at 1800  . enoxaparin (LOVENOX) 100 MG/ML injection Inject 85 mg into the skin every 12 (twelve) hours.   04/26/2017  at 2200   Scheduled: . atorvastatin  40 mg Oral QPM  . Chlorhexidine Gluconate Cloth  6 each Topical Q0600  . feeding supplement (PRO-STAT 64)  30 mL Oral BID BM  . levothyroxine  75 mcg Oral QAC breakfast  . multivitamin with minerals  1 tablet Oral QPM  . mupirocin ointment  1 application Nasal BID  . pantoprazole  80 mg Oral Daily  . tamsulosin  0.4 mg Oral Q2000  . torsemide  50 mg Oral Daily   Continuous: . sodium chloride    . sodium chloride     HBZ:JIRCVE chloride, sodium chloride, acetaminophen **OR** acetaminophen, lidocaine (PF), lidocaine-prilocaine, ondansetron **OR** ondansetron (ZOFRAN) IV, pentafluoroprop-tetrafluoroeth  Assesment: He was admitted with GI bleeding and acute blood loss anemia. He has required blood transfusion. His hemoglobin level this morning is 9.7. He has no complaints. He wants to eat and I will defer that to GI. 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 He has mental retardation at baseline and he is a ward of the state so we'll need to discuss with social services next week. Active Problems:   Mental retardation   Hyperlipidemia   Acute blood loss anemia   ESRD (end stage renal disease) (HCC)   Anemia in CKD (chronic kidney disease)   Colonic mass   Lower GI bleed   GI bleed    Plan: Continue treatments. I'm going to leave him in stepdown today to make sure that his hemoglobin level doesn't drop and that he is closely  monitored. Repeat H&H this afternoon.    LOS: 2 days   Luticia Tadros L 05/03/2017, 7:04 AM

## 2017-05-03 NOTE — Progress Notes (Signed)
Subjective: Interval History: Patient presently denies any difficulty breathing. He will force no complaint except feeling hungry and wants to eat. He does not have any nausea, vomiting or abdominal pain.  Objective: Vital signs in last 24 hours: Temp:  [97.6 F (36.4 C)-98.4 F (36.9 C)] 98.4 F (36.9 C) (07/08 0730) Pulse Rate:  [78-96] 86 (07/08 0730) Resp:  [9-16] 13 (07/08 0730) BP: (94-128)/(60-77) 113/66 (07/07 1800) SpO2:  [100 %] 100 % (07/08 0730) Weight:  [85.6 kg (188 lb 11.4 oz)-90.4 kg (199 lb 4.7 oz)] 85.6 kg (188 lb 11.4 oz) (07/08 0500) Weight change: 11.9 kg (26 lb 4.7 oz)  Intake/Output from previous day: 07/07 0701 - 07/08 0700 In: 670 [Blood:670] Out: 2230 [Urine:200] Intake/Output this shift: No intake/output data recorded.  Generally patient is alert tender no apparent distress Chest is clear to auscultation Heart exam regular rate and rhythm no murmur Extremities no edema  Lab Results:  Recent Labs  05/02/17 0436 05/03/17 0335  WBC 6.9 7.3  HGB 6.9* 9.7*  HCT 20.5* 28.1*  PLT 149* 154   BMET:   Recent Labs  05/02/17 0436 05/03/17 0336  NA 138 137  K 4.1 3.4*  CL 103 100*  CO2 26 28  GLUCOSE 103* 123*  BUN 61* 38*  CREATININE 6.16* 4.28*  CALCIUM 8.0* 8.4*   No results for input(s): PTH in the last 72 hours. Iron Studies:   Recent Labs  05/01/17 0812  IRON 34*  TIBC 231*  FERRITIN 85    Studies/Results: US Venous Img Lower Unilateral Right  Result Date: 05/01/2017 CLINICAL DATA:  History of deep venous thrombosis. Right lower extremity swelling and warmth. EXAM: RIGHT LOWER EXTREMITY VENOUS DOPPLER ULTRASOUND TECHNIQUE: Gray-scale sonography with graded compression, as well as color Doppler and duplex ultrasound were performed to evaluate the lower extremity deep venous systems from the level of the common femoral vein and including the common femoral, femoral, profunda femoral, popliteal and calf veins including the posterior  tibial, peroneal and gastrocnemius veins when visible. The superficial great saphenous vein was also interrogated. Spectral Doppler was utilized to evaluate flow at rest and with distal augmentation maneuvers in the common femoral, femoral and popliteal veins. COMPARISON:  None. FINDINGS: Contralateral Common Femoral Vein: Respiratory phasicity is normal and symmetric with the symptomatic side. No evidence of thrombus. Normal compressibility. Common Femoral Vein: No evidence of thrombus. Normal compressibility, respiratory phasicity and response to augmentation. Saphenofemoral Junction: No evidence of thrombus. Normal compressibility and flow on color Doppler imaging. Profunda Femoral Vein: No evidence of thrombus. Normal compressibility and flow on color Doppler imaging. Femoral Vein: No evidence of thrombus. Normal compressibility, respiratory phasicity and response to augmentation. Popliteal Vein: No evidence of thrombus. Normal compressibility, respiratory phasicity and response to augmentation. Calf Veins: No evidence of thrombus. Normal compressibility and flow on color Doppler imaging. Superficial Great Saphenous Vein: No evidence of thrombus. Normal compressibility and flow on color Doppler imaging. Venous Reflux:  None. Other Findings:  None. IMPRESSION: No evidence of DVT within the right lower extremity. Electronically Signed   By: Lajean Manes M.D.   On: 05/01/2017 14:27    I have reviewed the patient's current medications.  Assessment/Plan: Problem #1. Anemia: Secondary to GI bleeding. Patient has received another blood transfusion yesterday during dialysis. His hemoglobin has come up to 9.7 which is much better. Problem #2 end-stage renal disease: He is status post hemodialysis yesterday. Presently he doesn't have any uremic signs and symptoms. Potassium slightly low 3.4. Problem #  3 bone and metabolic disorder: His calcium and phosphorus is in range. Problem #4 hypertension: His blood  pressure is reasonably controlled  Problem #5 fluid management: Patient doesn't have any sign and symptom of fluid overload. We are able to remove about 2 L with dialysis. Problem #6 history of DVT: He is off Coumadin because of his GI bleeding Problem #7 history of obstructive uropathy Plan: Patient doesn't require dialysis today. 2] since patient is being followed by GI possibly they will address  his feeding request. 3] we'll check his CBC and renal panel in the morning.   LOS: 2 days   Dystany Duffy S 05/03/2017,9:24 AM

## 2017-05-04 ENCOUNTER — Inpatient Hospital Stay (HOSPITAL_COMMUNITY): Payer: Medicare Other | Admitting: Anesthesiology

## 2017-05-04 ENCOUNTER — Encounter (HOSPITAL_COMMUNITY)
Admission: EM | Disposition: A | Discharge status: Skilled Nursing Facility | Payer: Self-pay | Source: Home / Self Care | Attending: Internal Medicine

## 2017-05-04 DIAGNOSIS — K635 Polyp of colon: Secondary | ICD-10-CM

## 2017-05-04 HISTORY — PX: COLECTOMY: SHX59

## 2017-05-04 LAB — CBC WITH DIFFERENTIAL/PLATELET
Basophils Absolute: 0 10*3/uL (ref 0.0–0.1)
Basophils Relative: 0 %
Eosinophils Absolute: 0.1 10*3/uL (ref 0.0–0.7)
Eosinophils Relative: 2 %
HCT: 30.9 % — ABNORMAL LOW (ref 39.0–52.0)
Hemoglobin: 10.2 g/dL — ABNORMAL LOW (ref 13.0–17.0)
Lymphocytes Relative: 21 %
Lymphs Abs: 1.4 10*3/uL (ref 0.7–4.0)
MCH: 31.3 pg (ref 26.0–34.0)
MCHC: 33 g/dL (ref 30.0–36.0)
MCV: 94.8 fL (ref 78.0–100.0)
Monocytes Absolute: 1.1 10*3/uL — ABNORMAL HIGH (ref 0.1–1.0)
Monocytes Relative: 15 %
Neutro Abs: 4.3 10*3/uL (ref 1.7–7.7)
Neutrophils Relative %: 62 %
Platelets: 162 10*3/uL (ref 150–400)
RBC: 3.26 MIL/uL — ABNORMAL LOW (ref 4.22–5.81)
RDW: 18.4 % — ABNORMAL HIGH (ref 11.5–15.5)
WBC: 7 10*3/uL (ref 4.0–10.5)

## 2017-05-04 LAB — RENAL FUNCTION PANEL
Albumin: 3.1 g/dL — ABNORMAL LOW (ref 3.5–5.0)
Anion gap: 10 (ref 5–15)
BUN: 59 mg/dL — ABNORMAL HIGH (ref 6–20)
CO2: 28 mmol/L (ref 22–32)
Calcium: 8.2 mg/dL — ABNORMAL LOW (ref 8.9–10.3)
Chloride: 102 mmol/L (ref 101–111)
Creatinine, Ser: 5.64 mg/dL — ABNORMAL HIGH (ref 0.61–1.24)
GFR calc Af Amer: 11 mL/min — ABNORMAL LOW (ref >60)
GFR calc non Af Amer: 9 mL/min — ABNORMAL LOW (ref >60)
Glucose, Bld: 133 mg/dL — ABNORMAL HIGH (ref 65–99)
Phosphorus: 6 mg/dL — ABNORMAL HIGH (ref 2.5–4.6)
Potassium: 4.1 mmol/L (ref 3.5–5.1)
Sodium: 140 mmol/L (ref 135–145)

## 2017-05-04 LAB — HEPATITIS B SURFACE ANTIGEN: Hepatitis B Surface Ag: NEGATIVE

## 2017-05-04 LAB — GLUCOSE, CAPILLARY
Glucose-Capillary: 123 mg/dL — ABNORMAL HIGH (ref 65–99)
Glucose-Capillary: 159 mg/dL — ABNORMAL HIGH (ref 65–99)

## 2017-05-04 SURGERY — COLECTOMY, TOTAL
Anesthesia: General

## 2017-05-04 MED ORDER — POVIDONE-IODINE 10 % EX OINT
TOPICAL_OINTMENT | CUTANEOUS | Status: AC
  Filled 2017-05-04: qty 1

## 2017-05-04 MED ORDER — ONDANSETRON HCL 4 MG/2ML IJ SOLN
INTRAMUSCULAR | Status: AC
  Filled 2017-05-04: qty 2

## 2017-05-04 MED ORDER — PROMETHAZINE HCL 25 MG/ML IJ SOLN
INTRAMUSCULAR | Status: AC
  Filled 2017-05-04: qty 1

## 2017-05-04 MED ORDER — BUPIVACAINE LIPOSOME 1.3 % IJ SUSP
INTRAMUSCULAR | Status: AC
  Filled 2017-05-04: qty 20

## 2017-05-04 MED ORDER — FENTANYL CITRATE (PF) 100 MCG/2ML IJ SOLN
INTRAMUSCULAR | Status: AC
  Filled 2017-05-04: qty 2

## 2017-05-04 MED ORDER — MIDAZOLAM HCL 2 MG/2ML IJ SOLN
INTRAMUSCULAR | Status: AC
  Filled 2017-05-04: qty 2

## 2017-05-04 MED ORDER — PHENYLEPHRINE HCL 10 MG/ML IJ SOLN
INTRAMUSCULAR | Status: AC
  Filled 2017-05-04: qty 1

## 2017-05-04 MED ORDER — ONDANSETRON HCL 4 MG/2ML IJ SOLN
4.0000 mg | Freq: Once | INTRAMUSCULAR | Status: AC
  Administered 2017-05-04: 4 mg via INTRAVENOUS

## 2017-05-04 MED ORDER — GLYCOPYRROLATE 0.2 MG/ML IV SOSY
PREFILLED_SYRINGE | INTRAVENOUS | Status: DC | PRN
  Administered 2017-05-04: 0.4 mg via INTRAVENOUS

## 2017-05-04 MED ORDER — SODIUM CHLORIDE 0.9% FLUSH
INTRAVENOUS | Status: AC
  Filled 2017-05-04: qty 10

## 2017-05-04 MED ORDER — SUCCINYLCHOLINE CHLORIDE 20 MG/ML IJ SOLN
INTRAMUSCULAR | Status: DC | PRN
  Administered 2017-05-04: 80 mg via INTRAVENOUS

## 2017-05-04 MED ORDER — SODIUM CHLORIDE 0.9 % IV SOLN
INTRAVENOUS | Status: DC
  Administered 2017-05-04: 10:00:00 via INTRAVENOUS

## 2017-05-04 MED ORDER — GLYCOPYRROLATE 0.2 MG/ML IJ SOLN
INTRAMUSCULAR | Status: AC
  Filled 2017-05-04: qty 2

## 2017-05-04 MED ORDER — SODIUM CHLORIDE 0.9 % IV SOLN
INTRAVENOUS | Status: DC
  Administered 2017-05-04 – 2017-05-09 (×9): via INTRAVENOUS

## 2017-05-04 MED ORDER — PROMETHAZINE HCL 25 MG/ML IJ SOLN
12.5000 mg | Freq: Once | INTRAMUSCULAR | Status: AC
  Administered 2017-05-04: 12.5 mg via INTRAVENOUS

## 2017-05-04 MED ORDER — 0.9 % SODIUM CHLORIDE (POUR BTL) OPTIME
TOPICAL | Status: DC | PRN
  Administered 2017-05-04 (×2): 1000 mL

## 2017-05-04 MED ORDER — FENTANYL CITRATE (PF) 100 MCG/2ML IJ SOLN
50.0000 ug | INTRAMUSCULAR | Status: DC | PRN
  Administered 2017-05-04 – 2017-05-11 (×20): 50 ug via INTRAVENOUS
  Filled 2017-05-04 (×20): qty 2

## 2017-05-04 MED ORDER — NEOSTIGMINE METHYLSULFATE 5 MG/5ML IV SOSY
PREFILLED_SYRINGE | INTRAVENOUS | Status: DC | PRN
  Administered 2017-05-04: 3 mg via INTRAVENOUS

## 2017-05-04 MED ORDER — ROCURONIUM BROMIDE 50 MG/5ML IV SOLN
INTRAVENOUS | Status: AC
  Filled 2017-05-04: qty 1

## 2017-05-04 MED ORDER — METOPROLOL TARTRATE 5 MG/5ML IV SOLN
INTRAVENOUS | Status: AC
  Filled 2017-05-04: qty 5

## 2017-05-04 MED ORDER — NEOSTIGMINE METHYLSULFATE 10 MG/10ML IV SOLN
INTRAVENOUS | Status: AC
  Filled 2017-05-04: qty 1

## 2017-05-04 MED ORDER — HYDROMORPHONE HCL 1 MG/ML IJ SOLN
0.2500 mg | INTRAMUSCULAR | Status: DC | PRN
  Administered 2017-05-04 (×2): 0.5 mg via INTRAVENOUS

## 2017-05-04 MED ORDER — ROCURONIUM BROMIDE 10 MG/ML (PF) SYRINGE
PREFILLED_SYRINGE | INTRAVENOUS | Status: DC | PRN
  Administered 2017-05-04: 30 mg via INTRAVENOUS
  Administered 2017-05-04: 10 mg via INTRAVENOUS

## 2017-05-04 MED ORDER — PROPOFOL 10 MG/ML IV BOLUS
INTRAVENOUS | Status: DC | PRN
  Administered 2017-05-04: 130 mg via INTRAVENOUS

## 2017-05-04 MED ORDER — HYDROMORPHONE HCL 1 MG/ML IJ SOLN
INTRAMUSCULAR | Status: AC
  Filled 2017-05-04: qty 1

## 2017-05-04 MED ORDER — POVIDONE-IODINE 10 % EX OINT
TOPICAL_OINTMENT | CUTANEOUS | Status: DC | PRN
  Administered 2017-05-04: 1 via TOPICAL

## 2017-05-04 MED ORDER — MIDAZOLAM HCL 2 MG/2ML IJ SOLN
1.0000 mg | INTRAMUSCULAR | Status: DC
  Administered 2017-05-04: 2 mg via INTRAVENOUS

## 2017-05-04 MED ORDER — OXYCODONE HCL 5 MG PO TABS
5.0000 mg | ORAL_TABLET | ORAL | Status: DC | PRN
  Administered 2017-05-04 – 2017-05-09 (×8): 10 mg via ORAL
  Filled 2017-05-04 (×9): qty 2

## 2017-05-04 MED ORDER — SODIUM CHLORIDE 0.9 % IV SOLN
1.0000 g | INTRAVENOUS | Status: AC
  Administered 2017-05-04: 1 g via INTRAVENOUS
  Filled 2017-05-04: qty 1

## 2017-05-04 MED ORDER — FENTANYL CITRATE (PF) 100 MCG/2ML IJ SOLN
INTRAMUSCULAR | Status: DC | PRN
Start: 2017-05-04 — End: 2017-05-04
  Administered 2017-05-04 (×2): 100 ug via INTRAVENOUS
  Administered 2017-05-04 (×2): 50 ug via INTRAVENOUS

## 2017-05-04 MED ORDER — METOPROLOL TARTRATE 5 MG/5ML IV SOLN
INTRAVENOUS | Status: DC | PRN
  Administered 2017-05-04: 2 mg via INTRAVENOUS

## 2017-05-04 MED ORDER — SODIUM CHLORIDE 0.9 % IJ SOLN
INTRAMUSCULAR | Status: AC
  Filled 2017-05-04: qty 10

## 2017-05-04 MED ORDER — PROPOFOL 10 MG/ML IV BOLUS
INTRAVENOUS | Status: AC
  Filled 2017-05-04: qty 20

## 2017-05-04 MED ORDER — ONDANSETRON HCL 4 MG/2ML IJ SOLN
4.0000 mg | Freq: Once | INTRAMUSCULAR | Status: AC | PRN
  Administered 2017-05-04: 4 mg via INTRAVENOUS

## 2017-05-04 MED ORDER — SIMETHICONE 80 MG PO CHEW
40.0000 mg | CHEWABLE_TABLET | Freq: Four times a day (QID) | ORAL | Status: DC | PRN
Start: 2017-05-04 — End: 2017-05-15

## 2017-05-04 MED ORDER — SUCCINYLCHOLINE CHLORIDE 20 MG/ML IJ SOLN
INTRAMUSCULAR | Status: AC
  Filled 2017-05-04: qty 1

## 2017-05-04 MED ORDER — MICROFIBRILLAR COLL HEMOSTAT EX PADS
MEDICATED_PAD | CUTANEOUS | Status: DC | PRN
  Administered 2017-05-04: 1 via TOPICAL

## 2017-05-04 MED ORDER — GELATIN ABSORBABLE 12-7 MM EX MISC
CUTANEOUS | Status: DC | PRN
  Administered 2017-05-04: 1

## 2017-05-04 MED ORDER — PHENYLEPHRINE HCL 10 MG/ML IJ SOLN
INTRAMUSCULAR | Status: DC | PRN
  Administered 2017-05-04: 80 ug via INTRAVENOUS
  Administered 2017-05-04: 100 ug via INTRAVENOUS
  Administered 2017-05-04 (×3): 80 ug via INTRAVENOUS
  Administered 2017-05-04: 100 ug via INTRAVENOUS
  Administered 2017-05-04: 80 ug via INTRAVENOUS

## 2017-05-04 SURGICAL SUPPLY — 67 items
APPLIER CLIP 11 MED OPEN (CLIP)
APPLIER CLIP 13 LRG OPEN (CLIP)
APR CLP LRG 13 20 CLIP (CLIP)
APR CLP MED 11 20 MLT OPN (CLIP)
BAG HAMPER (MISCELLANEOUS) ×2 IMPLANT
BARRIER SKIN 2 3/4 (OSTOMY) IMPLANT
BRR SKN FLT 2.75X2.25 2 PC (OSTOMY)
CHLORAPREP W/TINT 26ML (MISCELLANEOUS) ×2 IMPLANT
CLAMP POUCH DRAINAGE QUIET (OSTOMY) IMPLANT
CLIP APPLIE 11 MED OPEN (CLIP) IMPLANT
CLIP APPLIE 13 LRG OPEN (CLIP) IMPLANT
CLOTH BEACON ORANGE TIMEOUT ST (SAFETY) ×2 IMPLANT
COVER LIGHT HANDLE STERIS (MISCELLANEOUS) ×4 IMPLANT
COVER MAYO STAND XLG (DRAPE) ×2 IMPLANT
DRAPE WARM FLUID 44X44 (DRAPE) ×2 IMPLANT
DRSG OPSITE POSTOP 4X10 (GAUZE/BANDAGES/DRESSINGS) ×2 IMPLANT
ELECT BLADE 6 FLAT ULTRCLN (ELECTRODE) ×2 IMPLANT
ELECT REM PT RETURN 9FT ADLT (ELECTROSURGICAL) ×2
ELECTRODE REM PT RTRN 9FT ADLT (ELECTROSURGICAL) ×1 IMPLANT
FORMALIN 10 PREFIL 480ML (MISCELLANEOUS) IMPLANT
GAUZE SPONGE 4X4 12PLY STRL (GAUZE/BANDAGES/DRESSINGS) ×2 IMPLANT
GLOVE BIOGEL PI IND STRL 6.5 (GLOVE) ×2 IMPLANT
GLOVE BIOGEL PI IND STRL 7.0 (GLOVE) ×4 IMPLANT
GLOVE BIOGEL PI INDICATOR 6.5 (GLOVE) ×2
GLOVE BIOGEL PI INDICATOR 7.0 (GLOVE) ×4
GLOVE SURG SS PI 6.5 STRL IVOR (GLOVE) ×2 IMPLANT
GLOVE SURG SS PI 7.5 STRL IVOR (GLOVE) ×6 IMPLANT
GOWN STRL REUS W/TWL LRG LVL3 (GOWN DISPOSABLE) ×12 IMPLANT
HANDLE SUCTION POOLE (INSTRUMENTS) ×1 IMPLANT
HEMOSTAT SURGICEL 4X8 (HEMOSTASIS) ×2 IMPLANT
INST SET MAJOR GENERAL (KITS) ×2 IMPLANT
KIT ROOM TURNOVER APOR (KITS) ×2 IMPLANT
LIGASURE IMPACT 36 18CM CVD LR (INSTRUMENTS) ×2 IMPLANT
MANIFOLD NEPTUNE II (INSTRUMENTS) ×2 IMPLANT
NS IRRIG 1000ML POUR BTL (IV SOLUTION) ×4 IMPLANT
PACK ABDOMINAL MAJOR (CUSTOM PROCEDURE TRAY) ×2 IMPLANT
PAD ARMBOARD 7.5X6 YLW CONV (MISCELLANEOUS) ×2 IMPLANT
PENCIL HANDSWITCHING (ELECTRODE) ×2 IMPLANT
POUCH OSTOMY 2 3/4  H 3804 (WOUND CARE)
POUCH OSTOMY 2 3/4 H 3804 (WOUND CARE)
POUCH OSTOMY 2 PC DRNBL 2.25 (WOUND CARE) IMPLANT
POUCH OSTOMY 2 PC DRNBL 2.75 (WOUND CARE) IMPLANT
POUCH OSTOMY DRNBL 2 1/4 (WOUND CARE)
RELOAD LINEAR CUT PROX 55 BLUE (ENDOMECHANICALS) IMPLANT
RELOAD PROXIMATE 75MM BLUE (ENDOMECHANICALS) ×4 IMPLANT
RETRACTOR WND ALEXIS 25 LRG (MISCELLANEOUS) ×1 IMPLANT
RTRCTR WOUND ALEXIS 25CM LRG (MISCELLANEOUS) ×2
SET BASIN LINEN APH (SET/KITS/TRAYS/PACK) ×2 IMPLANT
SPONGE LAP 18X18 X RAY DECT (DISPOSABLE) ×6 IMPLANT
SPONGE SURGIFOAM ABS GEL 100 (HEMOSTASIS) ×2 IMPLANT
STAPLER GUN LINEAR PROX 60 (STAPLE) ×2 IMPLANT
STAPLER PROXIMATE 55 BLUE (STAPLE) IMPLANT
STAPLER PROXIMATE 75MM BLUE (STAPLE) ×2 IMPLANT
STAPLER VISISTAT (STAPLE) ×2 IMPLANT
SUCTION POOLE HANDLE (INSTRUMENTS) ×2
SUT CHROMIC 0 SH (SUTURE) IMPLANT
SUT CHROMIC 3 0 SH 27 (SUTURE) ×2 IMPLANT
SUT NOVA NAB GS-26 0 60 (SUTURE) ×4 IMPLANT
SUT PDS AB 0 CTX 60 (SUTURE) ×4 IMPLANT
SUT PDS AB CT VIOLET #0 27IN (SUTURE) IMPLANT
SUT SILK 2 0 (SUTURE)
SUT SILK 2 0 REEL (SUTURE) IMPLANT
SUT SILK 2-0 18XBRD TIE 12 (SUTURE) IMPLANT
SUT SILK 3 0 SH CR/8 (SUTURE) ×4 IMPLANT
TOWEL BLUE STERILE X RAY DET (MISCELLANEOUS) ×2 IMPLANT
TOWEL OR 17X26 4PK STRL BLUE (TOWEL DISPOSABLE) ×2 IMPLANT
TRAY FOLEY CATH SILVER 16FR (SET/KITS/TRAYS/PACK) ×2 IMPLANT

## 2017-05-04 NOTE — Clinical Social Work Note (Addendum)
Clinical Social Work Assessment  Patient Details  Name: Zachary [name] MRN: [id number] Date of Birth: [birth year]  Date of referral:  05/04/17               Reason for consult:  [employer], Discharge Planning                Permission sought to share information with:  Case Freight forwarder, Customer service manager, Guardian Permission granted to share information::     Name::        Agency::  [facility]  Relationship::  [name] ([place] guardian)  [phone]  [phone]  Contact Information:     Housing/Transportation Living arrangements for the past 2 months:  [place] of Information:  Medical Team, Case Manager, Facility, Guardian Patient Interpreter Needed:  None Criminal Activity/Legal Involvement Pertinent to Current Situation/Hospitalization:  No - Comment as needed Significant Relationships:  [employer] Lives with:  Facility Resident Do you feel safe going back to the place where you live?  Yes Need for family participation in patient care:  Yes (Comment) (guardian)  Care giving concerns:  Patient admitted from [place] facility: [facility] where he was placed in January 2018 for physical therapy.  Patient originally from [place] center, but his legs continue to not heal and he may reside at [facility] from here on out per staff at [facility].  Reports patient is able to return at discharge. Currently patient is in surgery and facility was updated on plans.  Patient has a legal guardian with APS, call placed to APS and message left for [facility].  Message returned around 1615pm.  Agreeable for plan to return to [facility] at discharge.   Updated on patient's progress.  No concerns with care or return noted at this time.   Social Worker assessment / plan:  Assessment and consult completed as patient is admitted from [facility].  Call placed to APS, message left FL2 updated and asked CM to order PT as patient has had 60 days  of wellness and could return under SNF benefit of Medicare for continued rehab.  Will continue to follow and assist with disposition back to SNF when medically stable.  Employment status:  Disabled (Comment on whether or not currently receiving Disability) Insurance information:  Medicare, Medicaid In [place] PT Recommendations:  Not assessed at this time (requesting consult) Information / Referral to community resources:  [place]  Patient/Family's Response to care:  Still assessing  Patient/Family's Understanding of and Emotional Response to Diagnosis, Current Treatment, and Prognosis:  Still assessing, patient currently in surgery. All other members part of patient care agreeable to plan.  Emotional Assessment Appearance:  Appears stated age Attitude/Demeanor/Rapport:  Unable to Assess Affect (typically observed):  Unable to Assess Orientation:    Alcohol / Substance use:  Not Applicable Psych involvement (Current and /or in the community):  No (Comment)  Discharge Needs  Concerns to be addressed:  No discharge needs identified Readmission within the last 30 days:  No Current discharge risk:  None Barriers to Discharge:  No Barriers Identified   [place], LCSW 05/04/2017, 1:19 PM

## 2017-05-04 NOTE — H&P (View-Only) (Signed)
Reason for Consult: Dysplastic polyps of colon, anemia Referring Physician: [name] is an 71 y.o. male.  HPI: Patient is a 71 year old black male multiple medical problems including mental retardation, end-stage renal disease, and hypertension who underwent colonoscopy on 04/27/2017 and was found to have multiple tubular adenomas with several been being high-grade dysplasia. One was in the ascending colon and the other one was in the descending colon. They are otherwise scattered throughout the colon. He has had lower GI bleeding which required multiple blood transfusions. This has since resolved. Due to the pathologic findings, I have been consulted for a subtotal colectomy. History is limited from the patient due to his retardation. He is a ward of the state.  Past Medical History:  Diagnosis Date  . Abnormal CT scan, kidney 10/06/2011  . Acute pyelonephritis 10/07/2011  . Anemia    normocytic  . Anxiety    mental retardation  . Bladder wall thickening 10/06/2011  . BPH (benign prostatic hypertrophy)   . Diabetes mellitus   . Edema     history of lower extremity edema  . Heme positive stool   . Hydronephrosis   . Hyperkalemia   . Hyperlipidemia   . Hypernatremia   . Hypertension   . Hypothyroidism   . Impaired speech   . 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 MR (mental retardation)   . Obstructive uropathy   . 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 Poor historian poor historian  . Protein calorie malnutrition ([place])   . Pyelonephritis   . Renal failure (ARF), acute on chronic (HCC)   . Renal insufficiency    chronic history  . Smoking   . Uremia   . Urinary retention   . UTI (lower urinary tract infection) 10/06/2011  . UTI (lower urinary tract infection)     Past Surgical History:  Procedure Laterality Date  . AV FISTULA PLACEMENT Left 07/06/2015   Procedure:  INSERTION LEFT ARM ARTERIOVENOUS GORTEX GRAFT;  Surgeon: Angelia Mould, MD;   Location: [facility];  Service: Vascular;  Laterality: Left;  . AV FISTULA PLACEMENT Right 02/26/2016   Procedure: ARTERIOVENOUS (AV) FISTULA CREATION ;  Surgeon: Angelia Mould, MD;  Location: [facility];  Service: Vascular;  Laterality: Right;  . [place] REMOVAL Left 10/09/2015   Procedure: REMOVAL OF ARTERIOVENOUS GORETEX GRAFT ([place]) Evacuation of Lymphocele, Vein Patch angioplasty of brachial artery.;  Surgeon: Angelia Mould, MD;  Location: [facility];  Service: Vascular;  Laterality: Left;  . BASCILIC VEIN TRANSPOSITION Right 02/26/2016   Procedure: Right BASCILIC VEIN TRANSPOSITION;  Surgeon: Angelia Mould, MD;  Location: [facility];  Service: Vascular;  Laterality: Right;  . CIRCUMCISION N/A 01/04/2014   Procedure: CIRCUMCISION ADULT (procedure #1);  Surgeon: Marissa Nestle, MD;  Location: [facility];  Service: Urology;  Laterality: N/A;  . CYSTOSCOPY W/ RETROGRADES Bilateral 06/29/2015   Procedure: CYSTOSCOPY, DILATION OF URETHRAL STRICTURE WITH BILATERAL RETROGRADE PYELOGRAM,SUPRAPUBIC TUBE CHANGE;  Surgeon: Festus Aloe, MD;  Location: [facility];  Service: Urology;  Laterality: Bilateral;  . CYSTOSCOPY WITH URETHRAL DILATATION N/A 12/29/2013   Procedure: CYSTOSCOPY WITH URETHRAL DILATATION;  Surgeon: Marissa Nestle, MD;  Location: [facility];  Service: Urology;  Laterality: N/A;  . ORIF FEMUR FRACTURE Right 11/22/2016   Procedure: OPEN REDUCTION INTERNAL FIXATION (ORIF) DISTAL FEMUR FRACTURE;  Surgeon: Rod Can, MD;  Location: [facility];  Service: Orthopedics;  Laterality: Right;  . PERIPHERAL VASCULAR CATHETERIZATION N/A 10/08/2015   Procedure: A/V Shuntogram;  Surgeon: Angelia Mould, MD;  Location: [facility];  Service: Cardiovascular;  Laterality: N/A;  . TRANSURETHRAL RESECTION OF PROSTATE N/A 01/04/2014   Procedure: TRANSURETHRAL RESECTION OF THE PROSTATE (TURP) (procedure #2);  Surgeon: Marissa Nestle, MD;  Location: [facility];  Service: Urology;  Laterality: N/A;     Family History  Problem Relation Age of Onset  . Cancer Mother   . Colon cancer Neg Hx     Social History:  reports that he has never smoked. He has never used smokeless tobacco. He reports that he does not drink alcohol or use drugs.  Allergies: No Known Allergies  Medications:  Scheduled: . atorvastatin  40 mg Oral QPM  . Chlorhexidine Gluconate Cloth  6 each Topical Q0600  . feeding supplement (PRO-STAT 64)  30 mL Oral BID BM  . levothyroxine  75 mcg Oral QAC breakfast  . multivitamin with minerals  1 tablet Oral QPM  . mupirocin ointment  1 application Nasal BID  . pantoprazole  80 mg Oral Daily  . tamsulosin  0.4 mg Oral Q2000  . torsemide  50 mg Oral Daily    Results for orders placed or performed during the hospital encounter of 05/01/17 (from the past 48 hour(s))  MRSA PCR Screening     Status: Abnormal   Collection Time: 05/01/17 11:22 AM  Result Value Ref Range   MRSA by PCR POSITIVE (A) NEGATIVE    Comment:        The GeneXpert MRSA Assay (FDA approved for NASAL specimens only), is one component of a comprehensive MRSA colonization surveillance program. It is not intended to diagnose MRSA infection nor to guide or monitor treatment for MRSA infections. RESULT CALLED TO, READ BACK BY AND VERIFIED WITH: HYLTON L. AT 1406 ON [id number] BY THOMPSON S.   Prepare RBC     Status: None   Collection Time: 05/01/17 11:39 AM  Result Value Ref Range   Order Confirmation ORDER PROCESSED BY BLOOD BANK   CBC     Status: Abnormal   Collection Time: 05/01/17  3:41 PM  Result Value Ref Range   WBC 9.9 4.0 - 10.5 K/uL   RBC 2.80 (L) 4.22 - 5.81 MIL/uL   Hemoglobin 9.0 (L) 13.0 - 17.0 g/dL    Comment: DELTA CHECK NOTED POST TRANSFUSION SPECIMEN    HCT 26.6 (L) 39.0 - 52.0 %   MCV 95.0 78.0 - 100.0 fL    Comment: DELTA CHECK NOTED POST TRANSFUSION SPECIMEN    MCH 32.1 26.0 - 34.0 pg   MCHC 33.8 30.0 - 36.0 g/dL   RDW 18.9 (H) 11.5 - 15.5 %   Platelets 140 (L) 150  - 400 K/uL  CBC     Status: Abnormal   Collection Time: 05/02/17  4:36 AM  Result Value Ref Range   WBC 6.9 4.0 - 10.5 K/uL   RBC 2.16 (L) 4.22 - 5.81 MIL/uL   Hemoglobin 6.9 (LL) 13.0 - 17.0 g/dL    Comment: REPEATED TO VERIFY DELTA CHECK NOTED CRITICAL RESULT CALLED TO, READ BACK BY AND VERIFIED WITH: WAGONER,R RN AT 3154 BY HFLYNT 05/02/17    HCT 20.5 (L) 39.0 - 52.0 %   MCV 94.9 78.0 - 100.0 fL   MCH 31.9 26.0 - 34.0 pg   MCHC 33.7 30.0 - 36.0 g/dL   RDW 19.9 (H) 11.5 - 15.5 %   Platelets 149 (L) 150 - 400 K/uL  Renal function panel     Status: Abnormal  Collection Time: 05/02/17  4:36 AM  Result Value Ref Range   Sodium 138 135 - 145 mmol/L   Potassium 4.1 3.5 - 5.1 mmol/L   Chloride 103 101 - 111 mmol/L   CO2 26 22 - 32 mmol/L   Glucose, Bld 103 (H) 65 - 99 mg/dL   BUN 61 (H) 6 - 20 mg/dL   Creatinine, Ser 6.16 (H) 0.61 - 1.24 mg/dL   Calcium 8.0 (L) 8.9 - 10.3 mg/dL   Phosphorus 5.5 (H) 2.5 - 4.6 mg/dL   Albumin 2.9 (L) 3.5 - 5.0 g/dL   GFR calc non Af Amer 8 (L) >60 mL/min   GFR calc Af Amer 10 (L) >60 mL/min    Comment: (NOTE) The eGFR has been calculated using the CKD EPI equation. This calculation has not been validated in all clinical situations. eGFR's persistently <60 mL/min signify possible Chronic Kidney Disease.    Anion gap 9 5 - 15  CBC     Status: Abnormal   Collection Time: 05/03/17  3:35 AM  Result Value Ref Range   WBC 7.3 4.0 - 10.5 K/uL   RBC 3.04 (L) 4.22 - 5.81 MIL/uL   Hemoglobin 9.7 (L) 13.0 - 17.0 g/dL    Comment: DELTA CHECK NOTED POST TRANSFUSION SPECIMEN    HCT 28.1 (L) 39.0 - 52.0 %   MCV 92.4 78.0 - 100.0 fL   MCH 31.9 26.0 - 34.0 pg   MCHC 34.5 30.0 - 36.0 g/dL   RDW 18.7 (H) 11.5 - 15.5 %   Platelets 154 150 - 400 K/uL  Renal function panel     Status: Abnormal   Collection Time: 05/03/17  3:36 AM  Result Value Ref Range   Sodium 137 135 - 145 mmol/L   Potassium 3.4 (L) 3.5 - 5.1 mmol/L    Comment: DELTA CHECK NOTED    Chloride 100 (L) 101 - 111 mmol/L   CO2 28 22 - 32 mmol/L   Glucose, Bld 123 (H) 65 - 99 mg/dL   BUN 38 (H) 6 - 20 mg/dL   Creatinine, Ser 4.28 (H) 0.61 - 1.24 mg/dL   Calcium 8.4 (L) 8.9 - 10.3 mg/dL   Phosphorus 5.1 (H) 2.5 - 4.6 mg/dL   Albumin 3.2 (L) 3.5 - 5.0 g/dL   GFR calc non Af Amer 13 (L) >60 mL/min   GFR calc Af Amer 15 (L) >60 mL/min    Comment: (NOTE) The eGFR has been calculated using the CKD EPI equation. This calculation has not been validated in all clinical situations. eGFR's persistently <60 mL/min signify possible Chronic Kidney Disease.    Anion gap 9 5 - 15    US Venous Img Lower Unilateral Right  Result Date: 05/01/2017 CLINICAL DATA:  History of deep venous thrombosis. Right lower extremity swelling and warmth. EXAM: RIGHT LOWER EXTREMITY VENOUS DOPPLER ULTRASOUND TECHNIQUE: Gray-scale sonography with graded compression, as well as color Doppler and duplex ultrasound were performed to evaluate the lower extremity deep venous systems from the level of the common femoral vein and including the common femoral, femoral, profunda femoral, popliteal and calf veins including the posterior tibial, peroneal and gastrocnemius veins when visible. The superficial great saphenous vein was also interrogated. Spectral Doppler was utilized to evaluate flow at rest and with distal augmentation maneuvers in the common femoral, femoral and popliteal veins. COMPARISON:  None. FINDINGS: Contralateral Common Femoral Vein: Respiratory phasicity is normal and symmetric with the symptomatic side. No evidence of thrombus. Normal compressibility. Common Femoral  Vein: No evidence of thrombus. Normal compressibility, respiratory phasicity and response to augmentation. Saphenofemoral Junction: No evidence of thrombus. Normal compressibility and flow on color Doppler imaging. Profunda Femoral Vein: No evidence of thrombus. Normal compressibility and flow on color Doppler imaging. Femoral Vein: No  evidence of thrombus. Normal compressibility, respiratory phasicity and response to augmentation. Popliteal Vein: No evidence of thrombus. Normal compressibility, respiratory phasicity and response to augmentation. Calf Veins: No evidence of thrombus. Normal compressibility and flow on color Doppler imaging. Superficial Great Saphenous Vein: No evidence of thrombus. Normal compressibility and flow on color Doppler imaging. Venous Reflux:  None. Other Findings:  None. IMPRESSION: No evidence of DVT within the right lower extremity. Electronically Signed   By: Lajean Manes M.D.   On: 05/01/2017 14:27    ROS:  Review of systems not obtained due to patient factors.  Blood pressure 107/68, pulse 75, temperature 98.4 F (36.9 C), temperature source Oral, resp. rate 10, height 6' (1.829 m), weight 188 lb 11.4 oz (85.6 kg), SpO2 100 %. Physical Exam: Pleasant black male in no acute distress. Head is normocephalic, atraumatic Neck is supple Lungs clear auscultation with breath sounds bilaterally Heart examination reveals a regular rate and rhythm without S3 or S4 or murmurs Abdomen soft, nontender, nondistended. No hepatosplenomegaly, masses. 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 Will contact patient's social worker as he is a ward of the state. Transfuse 1 unit of packed red blood cells. Preoperative orders written.  Aviva Signs 05/03/2017, 11:18 AM

## 2017-05-04 NOTE — Progress Notes (Signed)
Dr Arnoldo Morale notified of pt's nausea. No emesis. Order given for phenergan. No NG needed.

## 2017-05-04 NOTE — NC FL2 (Signed)
[place] LEVEL OF CARE SCREENING TOOL     IDENTIFICATION  Patient Name: Zachary [name] Birthdate: [birth year] Sex: male Admission Date (Current Location): 05/01/2017  [facility] and [place] Number:  [employer] and Address:  [facility] [address], [place]      Provider Number: [id number]  Attending Physician Name and Address:  [name], MD  Relative Name and Phone Number:   Legal Guardian: [name]  [phone]    Current Level of Care: Hospital Recommended Level of Care: [place] Prior Approval Number:    Date Approved/Denied:   PASRR Number:    Discharge Plan: SNF    Current Diagnoses: Patient Active Problem List   Diagnosis Date Noted  . Dysplastic polyp of colon   . GI bleed 05/02/2017  . Acute blood loss anemia 05/01/2017  . ESRD (end stage renal disease) ([place]) 05/01/2017  . Anemia in CKD (chronic kidney disease) 05/01/2017  . Colonoscopy causing post-procedural bleeding   . Colonic mass   . Lower GI bleed   . Chronic anticoagulation 02/18/2017  . Anticoagulation management encounter 12/29/2016  . DVT of leg (deep venous thrombosis) ([place]) 12/25/2016  . Postoperative anemia due to acute blood loss   . Closed comminuted intra-articular fracture of distal femur, right, initial encounter ([place]) 11/22/2016  . Right femoral fracture ([place]) 11/20/2016  . Hydronephrosis, bilateral 03/10/2016  . End stage renal disease ([place]) 10/08/2015  . 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 Heme positive stool   . Abnormal CT scan, esophagus   . Protein-calorie malnutrition, severe ([place]) 07/10/2015  . Essential hypertension 07/08/2015  . Hyperlipidemia 07/08/2015  . Obstructive uropathy 04/17/2015  . Bladder neck contracture   . Anemia, chronic disease 04/16/2015  . Benign prostatic hyperplasia with urinary retention 03/03/2015  . Mental retardation 10/10/2011  . Acute  pyelonephritis 10/07/2011  . 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 Bladder wall thickening 10/06/2011  . Iron deficiency anemia 10/02/2011  . Diabetes mellitus ([place]) 10/01/2011  . FX CLOSED FIBULA NOS 02/10/2008    Orientation RESPIRATION BLADDER Height & Weight     Self, Place  O2 (2L while in hospital) Continent Weight: 190 lb 11.2 oz (86.5 kg) Height:  6' (182.9 cm)  BEHAVIORAL SYMPTOMS/MOOD NEUROLOGICAL BOWEL NUTRITION STATUS      Continent Diet (See DC orders)  AMBULATORY STATUS COMMUNICATION OF NEEDS Skin   Extensive Assist Verbally Surgical wounds, Skin abrasions, Bruising                       Personal Care Assistance Level of Assistance  Bathing, Feeding, Dressing Bathing Assistance: Limited assistance Feeding assistance: Independent Dressing Assistance: Limited assistance     Functional Limitations Info  Sight, Hearing, Speech Sight Info: Adequate Hearing Info: Adequate Speech Info: Adequate    SPECIAL CARE FACTORS FREQUENCY  PT (By licensed PT), OT (By licensed OT)     PT Frequency: 5x OT Frequency: 5x            Contractures Contractures Info: Not present    Additional Factors Info  Code Status, Allergies, Isolation Precautions Code Status Info: Full Code Allergies Info: NKA     Isolation Precautions Info: Contact: MRSA     Current Medications (05/04/2017):  This is the current hospital active medication list Current Facility-Administered Medications  Medication Dose Route Frequency Provider Last Rate Last Dose  . 0.9 %  sodium chloride infusion  100 mL Intravenous PRN  Fran Lowes, MD      . 0.9 %  sodium chloride infusion  100 mL Intravenous PRN Fran Lowes, MD      . acetaminophen (TYLENOL) tablet 650 mg  650 mg Oral Q6H PRN Tat, David, MD       Or  . acetaminophen (TYLENOL) suppository 650 mg  650 mg Rectal Q6H PRN Tat, Shanon Brow, MD      . atorvastatin (LIPITOR) tablet 40 mg  40 mg Oral QPM Orson Eva, MD   40 mg at 05/03/17 1711   . Chlorhexidine Gluconate Cloth 2 % PADS 6 each  6 each Topical Q0600 Tat, David, MD   6 each at 05/04/17 0600  . feeding supplement (PRO-STAT 64) liquid 30 mL  30 mL Oral BID BM Tat, David, MD   30 mL at 05/03/17 1428  . levothyroxine (SYNTHROID, LEVOTHROID) tablet 75 mcg  75 mcg Oral QAC breakfast Tat, Shanon Brow, MD   75 mcg at 05/03/17 0849  . lidocaine (PF) (XYLOCAINE) 1 % injection 5 mL  5 mL Intradermal PRN Fran Lowes, MD      . lidocaine-prilocaine (EMLA) cream 1 application  1 application Topical PRN Fran Lowes, MD      . multivitamin with minerals tablet 1 tablet  1 tablet Oral QPM Orson Eva, MD   1 tablet at 05/03/17 1711  . mupirocin ointment (BACTROBAN) 2 % 1 application  1 application Nasal BID Orson Eva, MD   1 application at 02/40/97 2225  . ondansetron (ZOFRAN) tablet 4 mg  4 mg Oral Q6H PRN Tat, David, MD       Or  . ondansetron (ZOFRAN) injection 4 mg  4 mg Intravenous Q6H PRN Tat, David, MD      . pantoprazole (PROTONIX) EC tablet 80 mg  80 mg Oral Daily Tat, David, MD   80 mg at 05/03/17 0900  . pentafluoroprop-tetrafluoroeth (GEBAUERS) aerosol 1 application  1 application Topical PRN Fran Lowes, MD      . tamsulosin (FLOMAX) capsule 0.4 mg  0.4 mg Oral Q2000 Tat, David, MD   0.4 mg at 05/03/17 2225  . torsemide (DEMADEX) tablet 50 mg  50 mg Oral Daily Tat, David, MD   50 mg at 05/03/17 0900     Discharge Medications: Please see discharge summary for a list of discharge medications.  Relevant Imaging Results:  Relevant Lab Results:   Additional Information SSN# [id number]. Receives HD TTS [address] [place], [place]

## 2017-05-04 NOTE — Anesthesia Preprocedure Evaluation (Signed)
Anesthesia Evaluation  Patient identified by MRN, date of birth, ID band Patient awake    Reviewed: Allergy & Precautions, NPO status , Patient's Chart, lab work & pertinent test results  Airway Mallampati: III  TM Distance: >3 FB     Dental  (+) Poor Dentition, Missing, Loose   Pulmonary neg pulmonary ROS,    Pulmonary exam normal breath sounds clear to auscultation       Cardiovascular hypertension, Pt. on medications Normal cardiovascular exam Rhythm:Regular Rate:Normal     Neuro/Psych PSYCHIATRIC DISORDERS Anxiety Mental retardation   GI/Hepatic   Endo/Other  diabetes, Poorly ControlledHypothyroidism   Renal/GU ESRFRenal disease     Musculoskeletal   Abdominal   Peds  Hematology  (+) anemia ,   Anesthesia Other Findings   Reproductive/Obstetrics                             Anesthesia Physical Anesthesia Plan  ASA: III  Anesthesia Plan: General   Post-op Pain Management:    Induction: Intravenous  PONV Risk Score and Plan:   Airway Management Planned: Oral ETT  Additional Equipment:   Intra-op Plan:   Post-operative Plan: Extubation in OR  Informed Consent: I have reviewed the patients History and Physical, chart, labs and discussed the procedure including the risks, benefits and alternatives for the proposed anesthesia with the patient or authorized representative who has indicated his/her understanding and acceptance.     Plan Discussed with:   Anesthesia Plan Comments:         Anesthesia Quick Evaluation

## 2017-05-04 NOTE — Op Note (Signed)
Patient:  Zachary [name]  DOB:  [birth year]  MRN:  [id number]   Preop Diagnosis:  Multiple dysplastic colon polyps  Postop Diagnosis:  Same  Procedure:  Total colectomy  Surgeon:  Aviva Signs, M.D.  Anes:  Gen. endotracheal  Indications:  Patient is a 71 year old black male with multiple medical problems who presents with high-grade dysplasia multiple polyps in both the ascending and descending colon regions. The patient now presents for total colectomy. The risks and benefits the procedure were explained to the patient's power of attorney, who is a Education officer, museum from the stated [place] as the patient is a ward of the state.  Procedure note:  The patient was placed in supine position. After induction of general endotracheal anesthesia, the abdomen was prepped and draped using usual sterile technique with ChloraPrep. Surgical site confirmation was performed.  A midline incision was made from above the umbilicus to suprapubic region. The peritoneal cavity was entered into without difficulty. The sigmoid colon and descending colon were mobilized along the peritoneal reflection. This was likewise done along the right colon. The hepatic flexure was mobilized without difficulty. A GIA stapler was placed across the terminal ileum and fired. This was likewise done across the distal sigmoid colon. The LigaSure was used to divide the mesentery. The splenic flexure was taken down using the ligature without difficulty. The specimen ultimately was removed and sent to pathology for examination. A side to side colorectal anastomosis was performed using a GIA 70 stapler. The staple line was bolstered using 3-0 silk sutures. Doppler cavity was then copiously irrigated with normal saline. The liver was inspected and noted to be within normal limits. The spleen was inspected and no bleeding was noted. Surgicel and Gelfoam were placed in the left upper quadrant. The bowel was then returned into the abdominal  cavity in an orderly fashion. All operating personnel been changed her gown and gloves. A new setup was used for closure.  The fascia was reapproximated using a looped 0 PDS running suture. The subcutaneous layer was irrigated with normal saline and the skin was closed using staples. Betadine ointment and dry sterile dressings were applied.  All tape and needle counts were correct at the end of the procedure. The patient was extubated in the operating room and transferred to PACU in stable condition.  Complications:  None  EBL:  150 mL  Specimen:  Colon

## 2017-05-04 NOTE — Anesthesia Postprocedure Evaluation (Signed)
Anesthesia Post Note  Patient: Zachary [name]  Procedure(s) Performed: Procedure(s) (LRB): TOTAL COLECTOMY (N/A)  Patient location during evaluation: PACU Anesthesia Type: General Level of consciousness: awake Pain management: satisfactory to patient Vital Signs Assessment: post-procedure vital signs reviewed and stable Respiratory status: spontaneous breathing and patient connected to nasal cannula oxygen Cardiovascular status: stable : Nausea present. See MAR for medications. Anesthetic complications: no     Last Vitals:  Vitals:   05/04/17 0500 05/04/17 0800  BP: 111/68 111/66  Pulse: 83 77  Resp: 11 12  Temp:      Last Pain:  Vitals:   05/04/17 0400  TempSrc: Oral                 Jakaiden Fill

## 2017-05-04 NOTE — Anesthesia Procedure Notes (Signed)
Procedure Name: Intubation Date/Time: 05/04/2017 10:07 AM Performed by: Jonna Munro Pre-anesthesia Checklist: Patient identified, Emergency Drugs available, Suction available, Patient being monitored and Timeout performed Patient Re-evaluated:Patient Re-evaluated prior to inductionOxygen Delivery Method: Circle system utilized Preoxygenation: Pre-oxygenation with 100% oxygen Intubation Type: IV induction Ventilation: Mask ventilation without difficulty Laryngoscope Size: Mac and 3 Grade View: Grade I Tube type: Oral Tube size: 7.0 mm Number of attempts: 1 Airway Equipment and Method: Stylet Placement Confirmation: ETT inserted through vocal cords under direct vision,  positive ETCO2 and breath sounds checked- equal and bilateral Secured at: 22 cm Tube secured with: Tape Dental Injury: Teeth and Oropharynx as per pre-operative assessment

## 2017-05-04 NOTE — Interval H&P Note (Signed)
History and Physical Interval Note:  05/04/2017 9:06 AM  Shirlyn Goltz  has presented today for surgery, with the diagnosis of multiple dysplastic polyps  The various methods of treatment have been discussed with the patient and family. After consideration of risks, benefits and other options for treatment, the patient has consented to  Procedure(s): TOTAL COLECTOMY (N/A) as a surgical intervention .  [name] history has been reviewed, patient examined, no change in status, stable for surgery.  I have reviewed the patient's chart and labs.  Questions were answered to the patient's satisfaction.     Aviva Signs

## 2017-05-04 NOTE — Progress Notes (Signed)
Subjective: Interval History: Patient denies any nausea or vomiting. No abdominal pain. Endocrine he denies also any difficulty breathing.  Objective: Vital signs in last 24 hours: Temp:  [97.2 F (36.2 C)-98.5 F (36.9 C)] 98.2 F (36.8 C) (07/09 0400) Pulse Rate:  [75-93] 83 (07/09 0500) Resp:  [8-21] 11 (07/09 0500) BP: (103-124)/(62-79) 111/68 (07/09 0500) SpO2:  [99 %-100 %] 100 % (07/09 0500) Weight:  [86.5 kg (190 lb 11.2 oz)] 86.5 kg (190 lb 11.2 oz) (07/09 0500) Weight change: -3.9 kg (-8 lb 9.6 oz)  Intake/Output from previous day: 07/08 0701 - 07/09 0700 In: 250 [I.V.:250] Out: 450 [Urine:450] Intake/Output this shift: No intake/output data recorded.  Generally patient is alert tender no apparent distress Chest is clear to auscultation Heart exam regular rate and rhythm no murmur Extremities no edema  Lab Results:  Recent Labs  05/03/17 0335 05/03/17 1923 05/04/17 0444  WBC 7.3  --  7.0  HGB 9.7* 10.9* 10.2*  HCT 28.1* 32.7* 30.9*  PLT 154  --  162   BMET:   Recent Labs  05/03/17 0336 05/04/17 0444  NA 137 140  K 3.4* 4.1  CL 100* 102  CO2 28 28  GLUCOSE 123* 133*  BUN 38* 59*  CREATININE 4.28* 5.64*  CALCIUM 8.4* 8.2*   No results for input(s): PTH in the last 72 hours. Iron Studies:  No results for input(s): IRON, TIBC, TRANSFERRIN, FERRITIN in the last 72 hours.  Studies/Results: No results found.  I have reviewed the patient's current medications.  Assessment/Plan: Problem #1. Anemia: Secondary to GI bleeding. Patient has received another blood transfusion yesterday during dialysis. His hemoglobin has Improved. Problem #2 end-stage renal disease: He is status post hemodialysis on Saturday. Potassium is normal and he doesn't have any uremic sinus symptoms. Problem #3 bone and metabolic disorder: His calcium and phosphorus is in range. Problem #4 hypertension: His blood pressure is reasonably controlled  Problem #5 fluid management:  Patient doesn't have any sign and symptom of fluid overload. . 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 Patient is for possible colectomy today. Plan: 1) Patient doesn't require dialysis today. 2] will make arrangements for patient to get dialysis tomorrow 3] we'll check his CBC and renal panel in the morning.   LOS: 3 days   Branda Chaudhary S 05/04/2017,9:10 AM

## 2017-05-04 NOTE — Progress Notes (Signed)
Incontinent of mod amt of dark red liquid stool. Area cleansed with soap and water. Peri pads x2 applied. Tolerated well.

## 2017-05-04 NOTE — Care Management Note (Signed)
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 Surgery today. From [facility]. Anticipate DC back to SNF when appropriate.                 Action/Plan: CSW aware. CM will follow.   Expected Discharge Date:    05/07/2017            Expected Discharge Plan:  Skilled Nursing Facility  In-House Referral:  Clinical Social Work  Discharge planning Services  CM Consult  Post Acute Care Choice:    Choice offered to:     DME Arranged:    DME Agency:     HH Arranged:    [place] Agency:     Status of Service:  In process, will continue to follow  If discussed at Long Length of Stay Meetings, dates discussed:    Additional Comments:  Chimaobi Casebolt, Chauncey Reading, RN 05/04/2017, 1:09 PM

## 2017-05-04 NOTE — Progress Notes (Signed)
Subjective: Patient was admitted due to anemia. He  Is found to have large bleeding poly and planned for colectomy today.  Objective: Vital signs in last 24 hours: Temp:  [97.2 F (36.2 C)-98.5 F (36.9 C)] 98.2 F (36.8 C) (07/09 0400) Pulse Rate:  [75-93] 83 (07/09 0500) Resp:  [8-21] 11 (07/09 0500) BP: (103-124)/(62-79) 111/68 (07/09 0500) SpO2:  [99 %-100 %] 100 % (07/09 0500) Weight:  [86.5 kg (190 lb 11.2 oz)] 86.5 kg (190 lb 11.2 oz) (07/09 0500) Weight change: -3.9 kg (-8 lb 9.6 oz) Last BM Date: 05/02/17  Intake/Output from previous day: 07/08 0701 - 07/09 0700 In: 250 [I.V.:250] Out: 450 [Urine:450]  PHYSICAL EXAM General appearance: alert and no distress Resp: clear to auscultation bilaterally Cardio: S1, S2 normal GI: soft, non-tender; bowel sounds normal; no masses,  no organomegaly Extremities: extremities normal, atraumatic, no cyanosis or edema  Lab Results:  Results for orders placed or performed during the hospital encounter of 05/01/17 (from the past 48 hour(s))  Hepatitis B surface antigen     Status: None   Collection Time: 05/02/17 11:26 AM  Result Value Ref Range   Hepatitis B Surface Ag Negative Negative    Comment: (NOTE) Performed At: [facility] [place], [place] [id number] Lindon Romp MD IE:[id number]   CBC     Status: Abnormal   Collection Time: 05/03/17  3:35 AM  Result Value Ref Range   WBC 7.3 4.0 - 10.5 K/uL   RBC 3.04 (L) 4.22 - 5.81 MIL/uL   Hemoglobin 9.7 (L) 13.0 - 17.0 g/dL    Comment: DELTA CHECK NOTED POST TRANSFUSION SPECIMEN    HCT 28.1 (L) 39.0 - 52.0 %   MCV 92.4 78.0 - 100.0 fL   MCH 31.9 26.0 - 34.0 pg   MCHC 34.5 30.0 - 36.0 g/dL   RDW 18.7 (H) 11.5 - 15.5 %   Platelets 154 150 - 400 K/uL  Renal function panel     Status: Abnormal   Collection Time: 05/03/17  3:36 AM  Result Value Ref Range   Sodium 137 135 - 145 mmol/L   Potassium 3.4 (L) 3.5 - 5.1 mmol/L    Comment: DELTA CHECK  NOTED   Chloride 100 (L) 101 - 111 mmol/L   CO2 28 22 - 32 mmol/L   Glucose, Bld 123 (H) 65 - 99 mg/dL   BUN 38 (H) 6 - 20 mg/dL   Creatinine, Ser 4.28 (H) 0.61 - 1.24 mg/dL   Calcium 8.4 (L) 8.9 - 10.3 mg/dL   Phosphorus 5.1 (H) 2.5 - 4.6 mg/dL   Albumin 3.2 (L) 3.5 - 5.0 g/dL   GFR calc non Af Amer 13 (L) >60 mL/min   GFR calc Af Amer 15 (L) >60 mL/min    Comment: (NOTE) The eGFR has been calculated using the CKD EPI equation. This calculation has not been validated in all clinical situations. eGFR's persistently <60 mL/min signify possible Chronic Kidney Disease.    Anion gap 9 5 - 15  Hemoglobin and hematocrit, blood     Status: Abnormal   Collection Time: 05/03/17  7:23 PM  Result Value Ref Range   Hemoglobin 10.9 (L) 13.0 - 17.0 g/dL    Comment: POST TRANSFUSION SPECIMEN   HCT 32.7 (L) 39.0 - 52.0 %  Renal function panel     Status: Abnormal   Collection Time: 05/04/17  4:44 AM  Result Value Ref Range   Sodium 140 135 - 145 mmol/L  Potassium 4.1 3.5 - 5.1 mmol/L    Comment: DELTA CHECK NOTED   Chloride 102 101 - 111 mmol/L   CO2 28 22 - 32 mmol/L   Glucose, Bld 133 (H) 65 - 99 mg/dL   BUN 59 (H) 6 - 20 mg/dL   Creatinine, Ser 5.64 (H) 0.61 - 1.24 mg/dL   Calcium 8.2 (L) 8.9 - 10.3 mg/dL   Phosphorus 6.0 (H) 2.5 - 4.6 mg/dL   Albumin 3.1 (L) 3.5 - 5.0 g/dL   GFR calc non Af Amer 9 (L) >60 mL/min   GFR calc Af Amer 11 (L) >60 mL/min    Comment: (NOTE) The eGFR has been calculated using the CKD EPI equation. This calculation has not been validated in all clinical situations. eGFR's persistently <60 mL/min signify possible Chronic Kidney Disease.    Anion gap 10 5 - 15  CBC with Differential/Platelet     Status: Abnormal   Collection Time: 05/04/17  4:44 AM  Result Value Ref Range   WBC 7.0 4.0 - 10.5 K/uL   RBC 3.26 (L) 4.22 - 5.81 MIL/uL   Hemoglobin 10.2 (L) 13.0 - 17.0 g/dL   HCT 30.9 (L) 39.0 - 52.0 %   MCV 94.8 78.0 - 100.0 fL   MCH 31.3 26.0 - 34.0 pg    MCHC 33.0 30.0 - 36.0 g/dL   RDW 18.4 (H) 11.5 - 15.5 %   Platelets 162 150 - 400 K/uL   Neutrophils Relative % 62 %   Neutro Abs 4.3 1.7 - 7.7 K/uL   Lymphocytes Relative 21 %   Lymphs Abs 1.4 0.7 - 4.0 K/uL   Monocytes Relative 15 %   Monocytes Absolute 1.1 (H) 0.1 - 1.0 K/uL   Eosinophils Relative 2 %   Eosinophils Absolute 0.1 0.0 - 0.7 K/uL   Basophils Relative 0 %   Basophils Absolute 0.0 0.0 - 0.1 K/uL    ABGS No results for input(s): PHART, PO2ART, TCO2, HCO3 in the last 72 hours.  Invalid input(s): PCO2 CULTURES Recent Results (from the past 240 hour(s))  MRSA PCR Screening     Status: Abnormal   Collection Time: 05/01/17 11:22 AM  Result Value Ref Range Status   MRSA by PCR POSITIVE (A) NEGATIVE Final    Comment:        The GeneXpert MRSA Assay (FDA approved for NASAL specimens only), is one component of a comprehensive MRSA colonization surveillance program. It is not intended to diagnose MRSA infection nor to guide or monitor treatment for MRSA infections. RESULT CALLED TO, READ BACK BY AND VERIFIED WITH: HYLTON L. AT 1406 ON [id number] BY THOMPSON S.    Studies/Results: No results found.  Medications: I have reviewed the patient's current medications.  Assesment:  Active Problems:   Mental retardation   Hyperlipidemia   Acute blood loss anemia   ESRD (end stage renal disease) (HCC)   Anemia in CKD (chronic kidney disease)   Colonic mass   Lower GI bleed   GI bleed    Plan:  Medications reviewed Continue hemodialysis Surgery as planned    LOS: 3 days   Dorothee Napierkowski 05/04/2017, 8:40 AM

## 2017-05-04 NOTE — Transfer of Care (Signed)
Immediate Anesthesia Transfer of Care Note  Patient: Zachary [name]  Procedure(s) Performed: Procedure(s): TOTAL COLECTOMY (N/A)  Patient Location: PACU  Anesthesia Type:General  Level of Consciousness: awake, alert  and oriented  Airway & Oxygen Therapy: Patient Spontanous Breathing and Patient connected to nasal cannula oxygen  Post-op Assessment: Report given to RN and Post -op Vital signs reviewed and stable  Post vital signs: Reviewed and stable  Last Vitals:  Vitals:   05/04/17 0500 05/04/17 0800  BP: 111/68 111/66  Pulse: 83 77  Resp: 11 12  Temp:      Last Pain:  Vitals:   05/04/17 0400  TempSrc: Oral      Patients Stated Pain Goal: 0 (50/51/07 1252)  Complications: No apparent anesthesia complications

## 2017-05-05 ENCOUNTER — Encounter (HOSPITAL_COMMUNITY): Payer: Self-pay | Admitting: General Surgery

## 2017-05-05 LAB — TYPE AND SCREEN
ABO/RH(D): B POS
Antibody Screen: NEGATIVE
Unit division: 0
Unit division: 0
Unit division: 0
Unit division: 0
Unit division: 0
Unit division: 0
Unit division: 0

## 2017-05-05 LAB — BASIC METABOLIC PANEL
Anion gap: 12 (ref 5–15)
BUN: 72 mg/dL — ABNORMAL HIGH (ref 6–20)
CO2: 24 mmol/L (ref 22–32)
Calcium: 8 mg/dL — ABNORMAL LOW (ref 8.9–10.3)
Chloride: 105 mmol/L (ref 101–111)
Creatinine, Ser: 6.05 mg/dL — ABNORMAL HIGH (ref 0.61–1.24)
GFR calc Af Amer: 10 mL/min — ABNORMAL LOW (ref >60)
GFR calc non Af Amer: 9 mL/min — ABNORMAL LOW (ref >60)
Glucose, Bld: 148 mg/dL — ABNORMAL HIGH (ref 65–99)
Potassium: 5.2 mmol/L — ABNORMAL HIGH (ref 3.5–5.1)
Sodium: 141 mmol/L (ref 135–145)

## 2017-05-05 LAB — BPAM RBC
Blood Product Expiration Date: 201807152359
Blood Product Expiration Date: 201807152359
Blood Product Expiration Date: 201807172359
Blood Product Expiration Date: 201807272359
Blood Product Expiration Date: 201807282359
Blood Product Expiration Date: 201807282359
Blood Product Expiration Date: 201808032359
ISSUE DATE / TIME: 201807060930
ISSUE DATE / TIME: 201807061239
ISSUE DATE / TIME: 201807071137
ISSUE DATE / TIME: 201807071224
ISSUE DATE / TIME: 201807081117
ISSUE DATE / TIME: 201807091357
Unit Type and Rh: 1700
Unit Type and Rh: 1700
Unit Type and Rh: 5100
Unit Type and Rh: 5100
Unit Type and Rh: 5100
Unit Type and Rh: 9500
Unit Type and Rh: 9500

## 2017-05-05 LAB — RENAL FUNCTION PANEL
Albumin: 3 g/dL — ABNORMAL LOW (ref 3.5–5.0)
Anion gap: 10 (ref 5–15)
BUN: 72 mg/dL — ABNORMAL HIGH (ref 6–20)
CO2: 24 mmol/L (ref 22–32)
Calcium: 8.1 mg/dL — ABNORMAL LOW (ref 8.9–10.3)
Chloride: 106 mmol/L (ref 101–111)
Creatinine, Ser: 6.1 mg/dL — ABNORMAL HIGH (ref 0.61–1.24)
GFR calc Af Amer: 10 mL/min — ABNORMAL LOW (ref >60)
GFR calc non Af Amer: 8 mL/min — ABNORMAL LOW (ref >60)
Glucose, Bld: 149 mg/dL — ABNORMAL HIGH (ref 65–99)
Phosphorus: 7.3 mg/dL — ABNORMAL HIGH (ref 2.5–4.6)
Potassium: 5.2 mmol/L — ABNORMAL HIGH (ref 3.5–5.1)
Sodium: 140 mmol/L (ref 135–145)

## 2017-05-05 LAB — CBC
HCT: 33.3 % — ABNORMAL LOW (ref 39.0–52.0)
Hemoglobin: 11 g/dL — ABNORMAL LOW (ref 13.0–17.0)
MCH: 31.4 pg (ref 26.0–34.0)
MCHC: 33 g/dL (ref 30.0–36.0)
MCV: 95.1 fL (ref 78.0–100.0)
Platelets: 201 10*3/uL (ref 150–400)
RBC: 3.5 MIL/uL — ABNORMAL LOW (ref 4.22–5.81)
RDW: 18.9 % — ABNORMAL HIGH (ref 11.5–15.5)
WBC: 13.8 10*3/uL — ABNORMAL HIGH (ref 4.0–10.5)

## 2017-05-05 MED ORDER — SODIUM CHLORIDE 0.9 % IV SOLN: 100.0000 mL | INTRAVENOUS | Status: DC | PRN

## 2017-05-05 MED ORDER — LIDOCAINE-PRILOCAINE 2.5-2.5 % EX CREA
TOPICAL_CREAM | CUTANEOUS | Status: AC
  Filled 2017-05-05: qty 5

## 2017-05-05 MED ORDER — FLUMAZENIL 0.5 MG/5ML IV SOLN
INTRAVENOUS | Status: AC
  Filled 2017-05-05: qty 5

## 2017-05-05 NOTE — Progress Notes (Signed)
1 Day Post-Op  Subjective: Mild incisional pain. No emesis noted.  Objective: Vital signs in last 24 hours: Temp:  [98.2 F (36.8 C)-99.1 F (37.3 C)] 99.1 F (37.3 C) (07/10 0400) Pulse Rate:  [76-124] 113 (07/10 0600) Resp:  [9-22] 12 (07/10 0600) BP: (107-135)/(65-85) 129/73 (07/10 0600) SpO2:  [98 %-100 %] 99 % (07/10 0600) Weight:  [192 lb 10.9 oz (87.4 kg)] 192 lb 10.9 oz (87.4 kg) (07/10 0500) Last BM Date: 05/04/17  Intake/Output from previous day: 07/09 0701 - 07/10 0700 In: 2258.8 [P.O.:440; I.V.:1818.8] Out: 150 [Blood:150] Intake/Output this shift: No intake/output data recorded.  General appearance: alert, cooperative and no distress Resp: clear to auscultation bilaterally Cardio: regular rate and rhythm, S1, S2 normal, no murmur, click, rub or gallop GI: Soft, rare bowel sounds appreciated. Dressing dry and intact.  Lab Results:   Recent Labs  05/03/17 0335 05/03/17 1923 05/04/17 0444  WBC 7.3  --  7.0  HGB 9.7* 10.9* 10.2*  HCT 28.1* 32.7* 30.9*  PLT 154  --  162   BMET  Recent Labs  05/03/17 0336 05/04/17 0444  NA 137 140  K 3.4* 4.1  CL 100* 102  CO2 28 28  GLUCOSE 123* 133*  BUN 38* 59*  CREATININE 4.28* 5.64*  CALCIUM 8.4* 8.2*   PT/INR No results for input(s): LABPROT, INR in the last 72 hours.  Studies/Results: No results found.  Anti-infectives: Anti-infectives    Start     Dose/Rate Route Frequency Ordered Stop   05/04/17 0935  ertapenem (INVANZ) 1 g in sodium chloride 0.9 % 50 mL IVPB     1 g 100 mL/hr over 30 Minutes Intravenous On call to O.R. 05/04/17 0935 05/04/17 1014      Assessment/Plan: s/p Procedure(s): TOTAL COLECTOMY Impression: Stable on postoperative day 1. For dialysis today. Will advance diet once bowel function returns.  LOS: 3 days    Aviva Signs 05/05/2017

## 2017-05-05 NOTE — Progress Notes (Signed)
Subjective: Interval History: Patient denies any difficulty breathing. Complains of some pain.  Objective: Vital signs in last 24 hours: Temp:  [98.2 F (36.8 C)-99.1 F (37.3 C)] 99.1 F (37.3 C) (07/10 0400) Pulse Rate:  [76-124] 113 (07/10 0600) Resp:  [9-22] 12 (07/10 0600) BP: (107-135)/(65-85) 129/73 (07/10 0600) SpO2:  [98 %-100 %] 99 % (07/10 0600) Weight:  [87.4 kg (192 lb 10.9 oz)] 87.4 kg (192 lb 10.9 oz) (07/10 0500) Weight change: 0.9 kg (1 lb 15.7 oz)  Intake/Output from previous day: 07/09 0701 - 07/10 0700 In: 2258.8 [P.O.:440; I.V.:1818.8] Out: 150 [Blood:150] Intake/Output this shift: No intake/output data recorded.  Generally patient is alert tender no apparent distress Chest is clear to auscultation Heart exam regular rate and rhythm no murmur Extremities no edema  Lab Results:  Recent Labs  05/03/17 0335 05/03/17 1923 05/04/17 0444  WBC 7.3  --  7.0  HGB 9.7* 10.9* 10.2*  HCT 28.1* 32.7* 30.9*  PLT 154  --  162   BMET:   Recent Labs  05/03/17 0336 05/04/17 0444  NA 137 140  K 3.4* 4.1  CL 100* 102  CO2 28 28  GLUCOSE 123* 133*  BUN 38* 59*  CREATININE 4.28* 5.64*  CALCIUM 8.4* 8.2*   No results for input(s): PTH in the last 72 hours. Iron Studies:  No results for input(s): IRON, TIBC, TRANSFERRIN, FERRITIN in the last 72 hours.  Studies/Results: No results found.  I have reviewed the patient's current medications.  Assessment/Plan: Problem #1. Anemia: Secondary to GI bleeding. His hemoglobin is stable. Problem #2 end-stage renal disease: He is status post hemodialysis status post hemodialysis on Saturday. Patient is due for dialysis today Problem #3 bone and metabolic disorder: His calcium and phosphorus is in range. Problem #4 hypertension: His blood pressure is reasonably controlled  Problem #5 fluid management: Patient doesn't have any sign and symptom of fluid overload. . Problem #6 history of DVT:  Problem #7 history of  obstructive uropathy Problem #8 history of multiple polyps and  with high-grade dysplasia. Status post colectomy Plan: 1) we'll dialyze patient today 2] no heparin 3] we'll check his CBC and renal panel in the morning.   LOS: 3 days   Pablo Stauffer S 05/05/2017,7:45 AM

## 2017-05-05 NOTE — Procedures (Signed)
    HEMODIALYSIS TREATMENT NOTE:   3 hour heparin-free dialysis completed via right upper arm AVF (16g/antegrade). Goal nearly met: 900cc removed (1L goal).  Ultrafiltration rate was decreased or interrupted in response to SBP low 90s (asymptomatic). All blood was returned and hemostasis was achieved within 15 minutes. Report given to Aldona Lento, RN.  Rockwell Alexandria, RN, CDN

## 2017-05-05 NOTE — Addendum Note (Signed)
Addendum  created 05/05/17 0731 by Vista Deck, CRNA   Charge Capture section accepted

## 2017-05-05 NOTE — Progress Notes (Signed)
Subjective: Patient had total colectomy yesterday. He is alert, awake and resting. He has no speific complaint. Objective: Vital signs in last 24 hours: Temp:  [98.2 F (36.8 C)-99.1 F (37.3 C)] 99.1 F (37.3 C) (07/10 0400) Pulse Rate:  [76-124] 113 (07/10 0600) Resp:  [9-22] 12 (07/10 0600) BP: (107-135)/(65-85) 129/73 (07/10 0600) SpO2:  [98 %-100 %] 99 % (07/10 0600) Weight:  [87.4 kg (192 lb 10.9 oz)] 87.4 kg (192 lb 10.9 oz) (07/10 0500) Weight change: 0.9 kg (1 lb 15.7 oz) Last BM Date: 05/04/17  Intake/Output from previous day: 07/09 0701 - 07/10 0700 In: 2258.8 [P.O.:440; I.V.:1818.8] Out: 150 [Blood:150]  PHYSICAL EXAM General appearance: alert and no distress Resp: clear to auscultation bilaterally Cardio: S1, S2 normal GI: soft, non-tender; bowel sounds normal; no masses,  no organomegaly and midline surgical incision dressed Extremities: extremities normal, atraumatic, no cyanosis or edema  Lab Results:  Results for orders placed or performed during the hospital encounter of 05/01/17 (from the past 48 hour(s))  Hemoglobin and hematocrit, blood     Status: Abnormal   Collection Time: 05/03/17  7:23 PM  Result Value Ref Range   Hemoglobin 10.9 (L) 13.0 - 17.0 g/dL    Comment: POST TRANSFUSION SPECIMEN   HCT 32.7 (L) 39.0 - 52.0 %  Renal function panel     Status: Abnormal   Collection Time: 05/04/17  4:44 AM  Result Value Ref Range   Sodium 140 135 - 145 mmol/L   Potassium 4.1 3.5 - 5.1 mmol/L    Comment: DELTA CHECK NOTED   Chloride 102 101 - 111 mmol/L   CO2 28 22 - 32 mmol/L   Glucose, Bld 133 (H) 65 - 99 mg/dL   BUN 59 (H) 6 - 20 mg/dL   Creatinine, Ser 5.64 (H) 0.61 - 1.24 mg/dL   Calcium 8.2 (L) 8.9 - 10.3 mg/dL   Phosphorus 6.0 (H) 2.5 - 4.6 mg/dL   Albumin 3.1 (L) 3.5 - 5.0 g/dL   GFR calc non Af Amer 9 (L) >60 mL/min   GFR calc Af Amer 11 (L) >60 mL/min    Comment: (NOTE) The eGFR has been calculated using the CKD EPI equation. This  calculation has not been validated in all clinical situations. eGFR's persistently <60 mL/min signify possible Chronic Kidney Disease.    Anion gap 10 5 - 15  CBC with Differential/Platelet     Status: Abnormal   Collection Time: 05/04/17  4:44 AM  Result Value Ref Range   WBC 7.0 4.0 - 10.5 K/uL   RBC 3.26 (L) 4.22 - 5.81 MIL/uL   Hemoglobin 10.2 (L) 13.0 - 17.0 g/dL   HCT 30.9 (L) 39.0 - 52.0 %   MCV 94.8 78.0 - 100.0 fL   MCH 31.3 26.0 - 34.0 pg   MCHC 33.0 30.0 - 36.0 g/dL   RDW 18.4 (H) 11.5 - 15.5 %   Platelets 162 150 - 400 K/uL   Neutrophils Relative % 62 %   Neutro Abs 4.3 1.7 - 7.7 K/uL   Lymphocytes Relative 21 %   Lymphs Abs 1.4 0.7 - 4.0 K/uL   Monocytes Relative 15 %   Monocytes Absolute 1.1 (H) 0.1 - 1.0 K/uL   Eosinophils Relative 2 %   Eosinophils Absolute 0.1 0.0 - 0.7 K/uL   Basophils Relative 0 %   Basophils Absolute 0.0 0.0 - 0.1 K/uL  Glucose, capillary     Status: Abnormal   Collection Time: 05/04/17  9:10 AM  Result Value Ref Range   Glucose-Capillary 123 (H) 65 - 99 mg/dL  Glucose, capillary     Status: Abnormal   Collection Time: 05/04/17 12:14 PM  Result Value Ref Range   Glucose-Capillary 159 (H) 65 - 99 mg/dL    ABGS No results for input(s): PHART, PO2ART, TCO2, HCO3 in the last 72 hours.  Invalid input(s): PCO2 CULTURES Recent Results (from the past 240 hour(s))  MRSA PCR Screening     Status: Abnormal   Collection Time: 05/01/17 11:22 AM  Result Value Ref Range Status   MRSA by PCR POSITIVE (A) NEGATIVE Final    Comment:        The GeneXpert MRSA Assay (FDA approved for NASAL specimens only), is one component of a comprehensive MRSA colonization surveillance program. It is not intended to diagnose MRSA infection nor to guide or monitor treatment for MRSA infections. RESULT CALLED TO, READ BACK BY AND VERIFIED WITH: HYLTON L. AT 1406 ON [id number] BY THOMPSON S.    Studies/Results: No results found.  Medications: I have  reviewed the patient's current medications.  Assesment:  Active Problems:   Mental retardation   Hyperlipidemia   Acute blood loss anemia   ESRD (end stage renal disease) (HCC)   Anemia in CKD (chronic kidney disease)   Colonic mass   Lower GI bleed   GI bleed   Dysplastic polyp of colon    Plan:  Medications reviewed Continue hemodialysis   As per surgery plan    LOS: 3 days   Khai Arrona 05/05/2017, 8:16 AM

## 2017-05-05 NOTE — Addendum Note (Signed)
Addendum  created 05/05/17 0806 by Vista Deck, CRNA   Sign clinical note

## 2017-05-05 NOTE — Consult Note (Signed)
   [facility] CM Inpatient Consult   05/05/2017  DIMITRIOUS MICCICHE [birth year] [id number]   Patient screened for potential [place] Management services. Patient is on the [facility] registry as a benefit of their [employer] medicare . Electronic medical record reveals patient's discharge plan is to return to long term care. [facility] Care Management services not appropriate at this time. If patient's post hospital needs change please place a [facility] Care Management consult. For questions please contact:   Sonika Levins RN, [facility] Liaison  [phone]) Business Mobile [phone]) Toll free office

## 2017-05-05 NOTE — Anesthesia Postprocedure Evaluation (Signed)
Anesthesia Post Note  Patient: Zachary [name]  Procedure(s) Performed: Procedure(s) (LRB): TOTAL COLECTOMY (N/A)  Patient location during evaluation: ICU Anesthesia Type: General Level of consciousness: awake Pain management: satisfactory to patient Vital Signs Assessment: post-procedure vital signs reviewed and stable Respiratory status: spontaneous breathing and patient connected to nasal cannula oxygen Cardiovascular status: stable Postop Assessment: no signs of nausea or vomiting Anesthetic complications: no     Last Vitals:  Vitals:   05/05/17 0500 05/05/17 0600  BP: 134/66 129/73  Pulse: (!) 112 (!) 113  Resp: 15 12  Temp:      Last Pain:  Vitals:   05/05/17 0737  TempSrc:   PainSc: 10-Worst pain ever           No labs this Am. Dialysis scheduled today.      Drucie Opitz

## 2017-05-06 MED ORDER — EPOETIN ALFA 2000 UNIT/ML IJ SOLN
2000.0000 [IU] | INTRAMUSCULAR | Status: DC
  Administered 2017-05-07 – 2017-05-09 (×2): 2000 [IU] via INTRAVENOUS
  Filled 2017-05-06 (×3): qty 1

## 2017-05-06 NOTE — Progress Notes (Signed)
2 Days Post-Op  Subjective: Patient has mild incisional pain. Did have 3 bowel movements yesterday. No nausea or vomiting noted.  Objective: Vital signs in last 24 hours: Temp:  [98.2 F (36.8 C)-100.1 F (37.8 C)] 99.8 F (37.7 C) (07/11 0729) Pulse Rate:  [47-126] 113 (07/11 0729) Resp:  [9-21] 11 (07/11 0729) BP: (86-129)/(47-73) 92/47 (07/11 0600) SpO2:  [58 %-100 %] 100 % (07/11 0729) Weight:  [191 lb 2.2 oz (86.7 kg)-193 lb 9 oz (87.8 kg)] 193 lb 9 oz (87.8 kg) (07/11 0500) Last BM Date: 05/05/17  Intake/Output from previous day: 07/10 0701 - 07/11 0700 In: 1800 [I.V.:1800] Out: 1109 [Urine:200] Intake/Output this shift: No intake/output data recorded.  General appearance: alert, cooperative and no distress GI: Soft, incision healing well. Bowel sounds present.  Lab Results:   Recent Labs  05/04/17 0444 05/05/17 0950  WBC 7.0 13.8*  HGB 10.2* 11.0*  HCT 30.9* 33.3*  PLT 162 201   BMET  Recent Labs  05/04/17 0444 05/05/17 0950  NA 140 140  141  K 4.1 5.2*  5.2*  CL 102 106  105  CO2 28 24  24   GLUCOSE 133* 149*  148*  BUN 59* 72*  72*  CREATININE 5.64* 6.10*  6.05*  CALCIUM 8.2* 8.1*  8.0*   PT/INR No results for input(s): LABPROT, INR in the last 72 hours.  Studies/Results: No results found.  Anti-infectives: Anti-infectives    Start     Dose/Rate Route Frequency Ordered Stop   05/04/17 0935  ertapenem (INVANZ) 1 g in sodium chloride 0.9 % 50 mL IVPB     1 g 100 mL/hr over 30 Minutes Intravenous On call to O.R. 05/04/17 0935 05/04/17 1014      Assessment/Plan: s/p Procedure(s): TOTAL COLECTOMY Impression: Stable on postoperative day 2. Will advance to full liquid diet. Okay for transfer to regular floor from my standpoint. Final pathology pending.  LOS: 4 days    Aviva Signs 05/06/2017

## 2017-05-06 NOTE — Progress Notes (Signed)
Zachary [name]  MRN: [id number]  DOB/AGE: [birth year] 71 y.o.  Primary Care Physician:Fanta, Tesfaye, MD  Admit date: 05/01/2017  Chief Complaint:  Chief Complaint  Patient presents with  . Abnormal Lab    low hgb    S-Pt presented on  05/01/2017 with  Chief Complaint  Patient presents with  . Abnormal Lab    low hgb  .    Pt offers no new complaints      .Pt says " I am okay"   Meds . atorvastatin  40 mg Oral QPM  . feeding supplement (PRO-STAT 64)  30 mL Oral BID BM  . levothyroxine  75 mcg Oral QAC breakfast  . multivitamin with minerals  1 tablet Oral QPM  . pantoprazole  80 mg Oral Daily  . tamsulosin  0.4 mg Oral Q2000  . torsemide  50 mg Oral Daily     Physical Exam: Vital signs in last 24 hours: Temp:  [98.2 F (36.8 C)-100.1 F (37.8 C)] 99.1 F (37.3 C) (07/11 1629) Pulse Rate:  [47-124] 110 (07/11 1400) Resp:  [9-21] 12 (07/11 1629) BP: (86-129)/(47-73) 116/64 (07/11 1000) SpO2:  [58 %-100 %] 99 % (07/11 1400) Weight:  [191 lb 2.2 oz (86.7 kg)-193 lb 9 oz (87.8 kg)] 193 lb 9 oz (87.8 kg) (07/11 0500) Weight change: 7.1 oz (0.2 kg) Last BM Date: 05/05/17  Intake/Output from previous day: 07/10 0701 - 07/11 0700 In: 1800 [I.V.:1800] Out: 1109 [Urine:200] Total I/O In: -  Out: 200 [Urine:200]   Physical Exam: General- pt is awake, follow commands Resp- No acute REsp distress, NO Rhonchi CVS- S1S2 regular in rate and rhythm GIT- BS+, staples in situ, EXT- NO LE Edema, NO Cyanosis Access- AVF +  Lab Results: CBC  Recent Labs  05/04/17 0444 05/05/17 0950  WBC 7.0 13.8*  HGB 10.2* 11.0*  HCT 30.9* 33.3*  PLT 162 201    BMET  Recent Labs  05/04/17 0444 05/05/17 0950  NA 140 140  141  K 4.1 5.2*  5.2*  CL 102 106  105  CO2 28 24  24   GLUCOSE 133* 149*  148*  BUN 59* 72*  72*  CREATININE 5.64* 6.10*  6.05*  CALCIUM 8.2* 8.1*  8.0*     MICRO Recent Results (from the past 240 hour(s))  MRSA PCR Screening     Status:  Abnormal   Collection Time: 05/01/17 11:22 AM  Result Value Ref Range Status   MRSA by PCR POSITIVE (A) NEGATIVE Final    Comment:        The GeneXpert MRSA Assay (FDA approved for NASAL specimens only), is one component of a comprehensive MRSA colonization surveillance program. It is not intended to diagnose MRSA infection nor to guide or monitor treatment for MRSA infections. RESULT CALLED TO, READ BACK BY AND VERIFIED WITH: HYLTON L. AT 1406 ON [id number] BY THOMPSON S.       Lab Results  Component Value Date   PTH 61 10/09/2015   CALCIUM 8.1 (L) 05/05/2017   CALCIUM 8.0 (L) 05/05/2017   CAION 1.24 10/08/2015   PHOS 7.3 (H) 05/05/2017                Impression: 1)Renal  ESRD on HD                Pt is on TTS schedule as outpt                Pt was last dialyzed yesterday  No need of HD today  2)HTN  BP stable  3)Anemia IN ESRD the goal for HGB is 9-11 Will keep on EPO during hd.   4)CKD Mineral-Bone Disorder Calcium when corrected for low albumin is at goal Phosphorus not  at goal.    5)CNS-Hx of mental Retardation  PMD following  6)Electrolytes  Hyperkalemic  Normonatremia    7)Acid base Co2  at goal.  8) GI-pt is s/p total colectomy Sx following  Plan:  Will continue current care Will dialyze in am  Will keep on epo   Zachary [name] S 05/06/2017, 4:46 PM

## 2017-05-06 NOTE — Progress Notes (Signed)
Subjective: Patient feels better. He is started on liquid diet. Objective: Vital signs in last 24 hours: Temp:  [98.2 F (36.8 C)-100.1 F (37.8 C)] 99.8 F (37.7 C) (07/11 0729) Pulse Rate:  [47-126] 113 (07/11 0729) Resp:  [9-21] 11 (07/11 0729) BP: (86-129)/(47-73) 92/47 (07/11 0600) SpO2:  [58 %-100 %] 100 % (07/11 0729) Weight:  [86.7 kg (191 lb 2.2 oz)-87.8 kg (193 lb 9 oz)] 87.8 kg (193 lb 9 oz) (07/11 0500) Weight change: 0.2 kg (7.1 oz) Last BM Date: 05/05/17  Intake/Output from previous day: 07/10 0701 - 07/11 0700 In: 1800 [I.V.:1800] Out: 1109 [Urine:200]  PHYSICAL EXAM General appearance: alert and no distress Resp: clear to auscultation bilaterally Cardio: S1, S2 normal GI: soft, non-tender; bowel sounds normal; no masses,  no organomegaly and midline surgical incision dressed Extremities: extremities normal, atraumatic, no cyanosis or edema  Lab Results:  Results for orders placed or performed during the hospital encounter of 05/01/17 (from the past 48 hour(s))  Glucose, capillary     Status: Abnormal   Collection Time: 05/04/17  9:10 AM  Result Value Ref Range   Glucose-Capillary 123 (H) 65 - 99 mg/dL  Glucose, capillary     Status: Abnormal   Collection Time: 05/04/17 12:14 PM  Result Value Ref Range   Glucose-Capillary 159 (H) 65 - 99 mg/dL  CBC     Status: Abnormal   Collection Time: 05/05/17  9:50 AM  Result Value Ref Range   WBC 13.8 (H) 4.0 - 10.5 K/uL   RBC 3.50 (L) 4.22 - 5.81 MIL/uL   Hemoglobin 11.0 (L) 13.0 - 17.0 g/dL   HCT 33.3 (L) 39.0 - 52.0 %   MCV 95.1 78.0 - 100.0 fL   MCH 31.4 26.0 - 34.0 pg   MCHC 33.0 30.0 - 36.0 g/dL   RDW 18.9 (H) 11.5 - 15.5 %   Platelets 201 150 - 400 K/uL  Renal function panel     Status: Abnormal   Collection Time: 05/05/17  9:50 AM  Result Value Ref Range   Sodium 140 135 - 145 mmol/L   Potassium 5.2 (H) 3.5 - 5.1 mmol/L   Chloride 106 101 - 111 mmol/L   CO2 24 22 - 32 mmol/L   Glucose, Bld 149 (H)  65 - 99 mg/dL   BUN 72 (H) 6 - 20 mg/dL   Creatinine, Ser 6.10 (H) 0.61 - 1.24 mg/dL   Calcium 8.1 (L) 8.9 - 10.3 mg/dL   Phosphorus 7.3 (H) 2.5 - 4.6 mg/dL   Albumin 3.0 (L) 3.5 - 5.0 g/dL   GFR calc non Af Amer 8 (L) >60 mL/min   GFR calc Af Amer 10 (L) >60 mL/min    Comment: (NOTE) The eGFR has been calculated using the CKD EPI equation. This calculation has not been validated in all clinical situations. eGFR's persistently <60 mL/min signify possible Chronic Kidney Disease.    Anion gap 10 5 - 15  Basic metabolic panel     Status: Abnormal   Collection Time: 05/05/17  9:50 AM  Result Value Ref Range   Sodium 141 135 - 145 mmol/L   Potassium 5.2 (H) 3.5 - 5.1 mmol/L    Comment: DELTA CHECK NOTED   Chloride 105 101 - 111 mmol/L   CO2 24 22 - 32 mmol/L   Glucose, Bld 148 (H) 65 - 99 mg/dL   BUN 72 (H) 6 - 20 mg/dL   Creatinine, Ser 6.05 (H) 0.61 - 1.24 mg/dL   Calcium  8.0 (L) 8.9 - 10.3 mg/dL   GFR calc non Af Amer 9 (L) >60 mL/min   GFR calc Af Amer 10 (L) >60 mL/min    Comment: (NOTE) The eGFR has been calculated using the CKD EPI equation. This calculation has not been validated in all clinical situations. eGFR's persistently <60 mL/min signify possible Chronic Kidney Disease.    Anion gap 12 5 - 15    ABGS No results for input(s): PHART, PO2ART, TCO2, HCO3 in the last 72 hours.  Invalid input(s): PCO2 CULTURES Recent Results (from the past 240 hour(s))  MRSA PCR Screening     Status: Abnormal   Collection Time: 05/01/17 11:22 AM  Result Value Ref Range Status   MRSA by PCR POSITIVE (A) NEGATIVE Final    Comment:        The GeneXpert MRSA Assay (FDA approved for NASAL specimens only), is one component of a comprehensive MRSA colonization surveillance program. It is not intended to diagnose MRSA infection nor to guide or monitor treatment for MRSA infections. RESULT CALLED TO, READ BACK BY AND VERIFIED WITH: HYLTON L. AT 1406 ON [id number] BY THOMPSON S.     Studies/Results: No results found.  Medications: I have reviewed the patient's current medications.  Assesment:  Active Problems:   Mental retardation   Hyperlipidemia   Acute blood loss anemia   ESRD (end stage renal disease) (HCC)   Anemia in CKD (chronic kidney disease)   Colonic mass   Lower GI bleed   GI bleed   Dysplastic polyp of colon    Plan:  Medications reviewed Continue hemodialysis   As per surgery plan    LOS: 4 days   Zachary [name] 05/06/2017, 8:22 AM

## 2017-05-07 LAB — CEA: CEA: 5 ng/mL — ABNORMAL HIGH (ref 0.0–4.7)

## 2017-05-07 LAB — RENAL FUNCTION PANEL
Albumin: 2.8 g/dL — ABNORMAL LOW (ref 3.5–5.0)
Albumin: 3 g/dL — ABNORMAL LOW (ref 3.5–5.0)
Anion gap: 10 (ref 5–15)
Anion gap: 11 (ref 5–15)
BUN: 21 mg/dL — ABNORMAL HIGH (ref 6–20)
BUN: 14 mg/dL (ref 6–20)
CO2: 28 mmol/L (ref 22–32)
CO2: 28 mmol/L (ref 22–32)
Calcium: 8.3 mg/dL — ABNORMAL LOW (ref 8.9–10.3)
Calcium: 8.5 mg/dL — ABNORMAL LOW (ref 8.9–10.3)
Chloride: 102 mmol/L (ref 101–111)
Chloride: 99 mmol/L — ABNORMAL LOW (ref 101–111)
Creatinine, Ser: 2.09 mg/dL — ABNORMAL HIGH (ref 0.61–1.24)
Creatinine, Ser: 1.6 mg/dL — ABNORMAL HIGH (ref 0.61–1.24)
GFR calc Af Amer: 36 mL/min — ABNORMAL LOW (ref >60)
GFR calc Af Amer: 49 mL/min — ABNORMAL LOW (ref >60)
GFR calc non Af Amer: 31 mL/min — ABNORMAL LOW (ref >60)
GFR calc non Af Amer: 43 mL/min — ABNORMAL LOW (ref >60)
Glucose, Bld: 93 mg/dL (ref 65–99)
Glucose, Bld: 87 mg/dL (ref 65–99)
Phosphorus: 2.2 mg/dL — ABNORMAL LOW (ref 2.5–4.6)
Phosphorus: 1.7 mg/dL — ABNORMAL LOW (ref 2.5–4.6)
Potassium: 2.8 mmol/L — ABNORMAL LOW (ref 3.5–5.1)
Potassium: 2.6 mmol/L — CL (ref 3.5–5.1)
Sodium: 140 mmol/L (ref 135–145)
Sodium: 138 mmol/L (ref 135–145)

## 2017-05-07 LAB — CBC WITH DIFFERENTIAL/PLATELET
Basophils Absolute: 0 10*3/uL (ref 0.0–0.1)
Basophils Absolute: 0 10*3/uL (ref 0.0–0.1)
Basophils Relative: 0 %
Basophils Relative: 0 %
Eosinophils Absolute: 0.3 10*3/uL (ref 0.0–0.7)
Eosinophils Absolute: 0.4 10*3/uL (ref 0.0–0.7)
Eosinophils Relative: 4 %
Eosinophils Relative: 4 %
HCT: 27.2 % — ABNORMAL LOW (ref 39.0–52.0)
HCT: 29 % — ABNORMAL LOW (ref 39.0–52.0)
Hemoglobin: 9 g/dL — ABNORMAL LOW (ref 13.0–17.0)
Hemoglobin: 9.4 g/dL — ABNORMAL LOW (ref 13.0–17.0)
Lymphocytes Relative: 9 %
Lymphocytes Relative: 8 %
Lymphs Abs: 0.8 10*3/uL (ref 0.7–4.0)
Lymphs Abs: 0.8 10*3/uL (ref 0.7–4.0)
MCH: 31.5 pg (ref 26.0–34.0)
MCH: 31.1 pg (ref 26.0–34.0)
MCHC: 33.1 g/dL (ref 30.0–36.0)
MCHC: 32.4 g/dL (ref 30.0–36.0)
MCV: 95.1 fL (ref 78.0–100.0)
MCV: 96 fL (ref 78.0–100.0)
Monocytes Absolute: 1 10*3/uL (ref 0.1–1.0)
Monocytes Absolute: 1.1 10*3/uL — ABNORMAL HIGH (ref 0.1–1.0)
Monocytes Relative: 11 %
Monocytes Relative: 11 %
Neutro Abs: 7 10*3/uL (ref 1.7–7.7)
Neutro Abs: 7.6 10*3/uL (ref 1.7–7.7)
Neutrophils Relative %: 76 %
Neutrophils Relative %: 77 %
Platelets: 197 10*3/uL (ref 150–400)
Platelets: 216 10*3/uL (ref 150–400)
RBC: 2.86 MIL/uL — ABNORMAL LOW (ref 4.22–5.81)
RBC: 3.02 MIL/uL — ABNORMAL LOW (ref 4.22–5.81)
RDW: 17.6 % — ABNORMAL HIGH (ref 11.5–15.5)
RDW: 17.7 % — ABNORMAL HIGH (ref 11.5–15.5)
WBC: 9.2 10*3/uL (ref 4.0–10.5)
WBC: 9.8 10*3/uL (ref 4.0–10.5)

## 2017-05-07 MED ORDER — EPOETIN ALFA 2000 UNIT/ML IJ SOLN
INTRAMUSCULAR | Status: AC
  Administered 2017-05-07: 2000 [IU] via INTRAVENOUS
  Filled 2017-05-07: qty 1

## 2017-05-07 NOTE — Progress Notes (Signed)
Zachary [name] is a 71 y.o. male patient. 1. Lower GI bleed   2. Colonic mass   3. Colonoscopy causing post-procedural bleeding   4. DVT of leg (deep venous thrombosis) (HCC)    Past Medical History:  Diagnosis Date  . Abnormal CT scan, kidney 10/06/2011  . Acute pyelonephritis 10/07/2011  . Anemia    normocytic  . Anxiety    mental retardation  . Bladder wall thickening 10/06/2011  . BPH (benign prostatic hypertrophy)   . Diabetes mellitus   . Edema     history of lower extremity edema  . Heme positive stool   . Hydronephrosis   . Hyperkalemia   . Hyperlipidemia   . Hypernatremia   . Hypertension   . Hypothyroidism   . Impaired speech   . 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 MR (mental retardation)   . Obstructive uropathy   . 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 Poor historian poor historian  . Protein calorie malnutrition ([place])   . Pyelonephritis   . Renal failure (ARF), acute on chronic (HCC)   . Renal insufficiency    chronic history  . Smoking   . Uremia   . Urinary retention   . UTI (lower urinary tract infection) 10/06/2011  . UTI (lower urinary tract infection)    Current Facility-Administered Medications  Medication Dose Route Frequency Provider Last Rate Last Dose  . 0.9 %  sodium chloride infusion  100 mL Intravenous PRN Befekadu, Eli Phillips, MD      . 0.9 %  sodium chloride infusion  100 mL Intravenous PRN Befekadu, Belayenh, MD      . 0.9 %  sodium chloride infusion  100 mL Intravenous PRN Befekadu, Belayenh, MD      . 0.9 %  sodium chloride infusion  100 mL Intravenous PRN Fran Lowes, MD      . 0.9 %  sodium chloride infusion   Intravenous Continuous Aviva Signs, MD 75 mL/hr at 05/06/17 2210    . acetaminophen (TYLENOL) tablet 650 mg  650 mg Oral Q6H PRN Tat, David, MD       Or  . acetaminophen (TYLENOL) suppository 650 mg  650 mg Rectal Q6H PRN Tat, Shanon Brow, MD      . atorvastatin (LIPITOR) tablet 40 mg  40 mg Oral QPM Orson Eva, MD   40  mg at 05/06/17 1725  . epoetin alfa (EPOGEN,PROCRIT) injection 2,000 Units  2,000 Units Intravenous Q T,Th,Sa-HD Bhutani, Manpreet S, MD      . feeding supplement (PRO-STAT 64) liquid 30 mL  30 mL Oral BID BM Tat, David, MD   30 mL at 05/06/17 1507  . fentaNYL (SUBLIMAZE) injection 50 mcg  50 mcg Intravenous Q2H PRN Aviva Signs, MD   50 mcg at 05/06/17 2209  . levothyroxine (SYNTHROID, LEVOTHROID) tablet 75 mcg  75 mcg Oral QAC breakfast Tat, Shanon Brow, MD   Stopped at 05/07/17 0800  . lidocaine (PF) (XYLOCAINE) 1 % injection 5 mL  5 mL Intradermal PRN Fran Lowes, MD      . lidocaine-prilocaine (EMLA) cream 1 application  1 application Topical PRN Fran Lowes, MD      . multivitamin with minerals tablet 1 tablet  1 tablet Oral QPM Orson Eva, MD   1 tablet at 05/06/17 1725  . ondansetron (ZOFRAN) tablet 4 mg  4 mg Oral Q6H PRN Orson Eva, MD   4 mg at 05/06/17 2108   Or  . ondansetron (ZOFRAN) injection 4 mg  4  mg Intravenous Q6H PRN Orson Eva, MD   4 mg at 05/04/17 1746  . oxyCODONE (Oxy IR/ROXICODONE) immediate release tablet 5-10 mg  5-10 mg Oral Q4H PRN Aviva Signs, MD   10 mg at 05/07/17 0815  . pantoprazole (PROTONIX) EC tablet 80 mg  80 mg Oral Daily Tat, Shanon Brow, MD   Stopped at 05/07/17 1000  . pentafluoroprop-tetrafluoroeth (GEBAUERS) aerosol 1 application  1 application Topical PRN Fran Lowes, MD      . simethicone (MYLICON) chewable tablet 40 mg  40 mg Oral Q6H PRN Aviva Signs, MD      . tamsulosin [facility]) capsule 0.4 mg  0.4 mg Oral Q2000 Tat, David, MD   0.4 mg at 05/06/17 1944  . torsemide (DEMADEX) tablet 50 mg  50 mg Oral Daily Tat, David, MD   50 mg at 05/06/17 0910   No Known Allergies Active Problems:   Mental retardation   Hyperlipidemia   Acute blood loss anemia   ESRD (end stage renal disease) (HCC)   Anemia in CKD (chronic kidney disease)   Colonic mass   Lower GI bleed   GI bleed   Dysplastic polyp of colon  Blood pressure (!) 115/58,  pulse 97, temperature 99.4 F (37.4 C), temperature source Oral, resp. rate 11, height 6' (1.829 m), weight 194 lb 7.1 oz (88.2 kg), SpO2 100 %.  Subjective  Tolerating diet, several small bowel movements overnight, receiving dialysis during exam.  C/o minimal pain Objective  GEN: NAD ADB: soft, mildly distended, approp TTP at midline and RLQ, Inc c/d/i Assessment & Plan 71 y/o M s/p total colectomy -- advance diet as tolerated -- OOB -- Continue to follow along with Hospitalists Andres Labrum 05/07/2017

## 2017-05-07 NOTE — Progress Notes (Signed)
Pt up from ICU to room 318 with belongings.  VSS and charted.  Pt is oriented to the room, call bell and phone within reach.  Bed alarm on.

## 2017-05-07 NOTE — Progress Notes (Signed)
Patient alert. Resting in bed watching tv. IV patent. Dialysis treatment has been completed. Vital signs are stable. Report called to Tacey Heap, RN. Patient to be transferred to room 318.

## 2017-05-07 NOTE — Care Management Note (Signed)
Case Management Note  Patient Details  Name: Zachary [name] MRN: [id number] Date of Birth: Nov 30, 1945   Status of Service:  In process, will continue to follow  If discussed at Long Length of Stay Meetings, dates discussed:  05/07/2017  Additional Comments:  Teela Narducci, Chauncey Reading, RN 05/07/2017, 10:24 AM

## 2017-05-07 NOTE — Progress Notes (Addendum)
CRITICAL VALUE ALERT  Critical Value: K+ 2.6  Date & Time Notied: 05/07/17 1300  Provider Notified: Dr.Fanta  Orders Received/Actions taken: Dr. Legrand Rams asked that Dialysis nurse Caren Griffins, RN) notify nephrologist for further orders. Messaged relayed to Caren Griffins, RN who is going to speak with Dr. Eden Lathe.

## 2017-05-07 NOTE — Progress Notes (Signed)
Zachary [name]  MRN: [id number]  DOB/AGE: 1945-11-15 71 y.o.  Primary Care Physician:Fanta, Tesfaye, MD  Admit date: 05/01/2017  Chief Complaint:  Chief Complaint  Patient presents with  . Abnormal Lab    low hgb    S-Pt presented on  05/01/2017 with  Chief Complaint  Patient presents with  . Abnormal Lab    low hgb  .    Pt offers no new complaints      .Pt says " I am okay"   Meds . atorvastatin  40 mg Oral QPM  . epoetin (EPOGEN/PROCRIT) injection  2,000 Units Intravenous Q T,Th,Sa-HD  . feeding supplement (PRO-STAT 64)  30 mL Oral BID BM  . levothyroxine  75 mcg Oral QAC breakfast  . multivitamin with minerals  1 tablet Oral QPM  . pantoprazole  80 mg Oral Daily  . tamsulosin  0.4 mg Oral Q2000  . torsemide  50 mg Oral Daily     Physical Exam: Vital signs in last 24 hours: Temp:  [98 F (36.7 C)-99.4 F (37.4 C)] 99.4 F (37.4 C) (07/12 0726) Pulse Rate:  [83-114] 98 (07/12 0726) Resp:  [10-18] 14 (07/12 0726) BP: (111-125)/(62-65) 122/63 (07/12 0600) SpO2:  [98 %-100 %] 100 % (07/12 0726) Weight:  [194 lb 7.1 oz (88.2 kg)] 194 lb 7.1 oz (88.2 kg) (07/12 0500) Weight change: 1 lb 5.2 oz (0.6 kg) Last BM Date: 05/05/17  Intake/Output from previous day: 07/11 0701 - 07/12 0700 In: 2040 [P.O.:240; I.V.:1800] Out: 200 [Urine:200] No intake/output data recorded.   Physical Exam: General- pt is awake, follow commands Resp- No acute REsp distress, NO Rhonchi CVS- S1S2 regular in rate and rhythm GIT- BS+, staples in situ, EXT- NO LE Edema, NO Cyanosis Access- AVF +  Lab Results: CBC  Recent Labs  05/05/17 0950  WBC 13.8*  HGB 11.0*  HCT 33.3*  PLT 201    BMET  Recent Labs  05/05/17 0950  NA 140  141  K 5.2*  5.2*  CL 106  105  CO2 24  24  GLUCOSE 149*  148*  BUN 72*  72*  CREATININE 6.10*  6.05*  CALCIUM 8.1*  8.0*     MICRO Recent Results (from the past 240 hour(s))  MRSA PCR Screening     Status: Abnormal   Collection  Time: 05/01/17 11:22 AM  Result Value Ref Range Status   MRSA by PCR POSITIVE (A) NEGATIVE Final    Comment:        The GeneXpert MRSA Assay (FDA approved for NASAL specimens only), is one component of a comprehensive MRSA colonization surveillance program. It is not intended to diagnose MRSA infection nor to guide or monitor treatment for MRSA infections. RESULT CALLED TO, READ BACK BY AND VERIFIED WITH: HYLTON L. AT 1406 ON [id number] BY THOMPSON S.       Lab Results  Component Value Date   PTH 61 10/09/2015   CALCIUM 8.1 (L) 05/05/2017   CALCIUM 8.0 (L) 05/05/2017   CAION 1.24 10/08/2015   PHOS 7.3 (H) 05/05/2017                Impression: 1)Renal  ESRD on HD                Pt is on TTS schedule as outpt                Pt was last dialyzed yesterday  Pt will be dialyzed today  2)HTN  BP stable  3)Anemia IN ESRD the goal for HGB is 9-11 Will keep on EPO during hd.   4)CKD Mineral-Bone Disorder Calcium when corrected for low albumin is at goal Phosphorus not  at goal.    5)CNS-Hx of mental Retardation  PMD following  6)Electrolytes  Hyperkalemic  Normonatremia    7)Acid base Co2  at goal.  8) GI-pt is s/p total colectomy Sx following  Plan:  Will continue current care Will dialyze today Will keep on epo   Liliann File S 05/07/2017, 8:01 AM

## 2017-05-07 NOTE — Progress Notes (Signed)
Per the diaylsis nurse Karen Chafe, she spoke with Dr. Konrad Saha in regards to potassium 2.6. MD felt results not accurate due to labs drawn during dialysis treatment, will recheck labs in am. Message relayed to Tacey Heap, RN.

## 2017-05-07 NOTE — Progress Notes (Signed)
Subjective: Patient is resting. He is being started on hemodialysis. He on liquid diet but hisn pio intake remained poor. No new complaint. Objective: Vital signs in last 24 hours: Temp:  [98 F (36.7 C)-99.4 F (37.4 C)] 99.4 F (37.4 C) (07/12 0726) Pulse Rate:  [83-114] 97 (07/12 0815) Resp:  [10-18] 11 (07/12 0815) BP: (111-131)/(58-65) 115/58 (07/12 0815) SpO2:  [98 %-100 %] 100 % (07/12 0815) Weight:  [88.2 kg (194 lb 7.1 oz)] 88.2 kg (194 lb 7.1 oz) (07/12 0500) Weight change: 0.6 kg (1 lb 5.2 oz) Last BM Date: 05/05/17  Intake/Output from previous day: 07/11 0701 - 07/12 0700 In: 2040 [P.O.:240; I.V.:1800] Out: 200 [Urine:200]  PHYSICAL EXAM General appearance: alert and no distress Resp: clear to auscultation bilaterally Cardio: S1, S2 normal GI: soft, non-tender; bowel sounds normal; no masses,  no organomegaly and midline surgical incision dressed Extremities: extremities normal, atraumatic, no cyanosis or edema  Lab Results:  Results for orders placed or performed during the hospital encounter of 05/01/17 (from the past 48 hour(s))  CBC     Status: Abnormal   Collection Time: 05/05/17  9:50 AM  Result Value Ref Range   WBC 13.8 (H) 4.0 - 10.5 K/uL   RBC 3.50 (L) 4.22 - 5.81 MIL/uL   Hemoglobin 11.0 (L) 13.0 - 17.0 g/dL   HCT 33.3 (L) 39.0 - 52.0 %   MCV 95.1 78.0 - 100.0 fL   MCH 31.4 26.0 - 34.0 pg   MCHC 33.0 30.0 - 36.0 g/dL   RDW 18.9 (H) 11.5 - 15.5 %   Platelets 201 150 - 400 K/uL  Renal function panel     Status: Abnormal   Collection Time: 05/05/17  9:50 AM  Result Value Ref Range   Sodium 140 135 - 145 mmol/L   Potassium 5.2 (H) 3.5 - 5.1 mmol/L   Chloride 106 101 - 111 mmol/L   CO2 24 22 - 32 mmol/L   Glucose, Bld 149 (H) 65 - 99 mg/dL   BUN 72 (H) 6 - 20 mg/dL   Creatinine, Ser 6.10 (H) 0.61 - 1.24 mg/dL   Calcium 8.1 (L) 8.9 - 10.3 mg/dL   Phosphorus 7.3 (H) 2.5 - 4.6 mg/dL   Albumin 3.0 (L) 3.5 - 5.0 g/dL   GFR calc non Af Amer 8 (L)  >60 mL/min   GFR calc Af Amer 10 (L) >60 mL/min    Comment: (NOTE) The eGFR has been calculated using the CKD EPI equation. This calculation has not been validated in all clinical situations. eGFR's persistently <60 mL/min signify possible Chronic Kidney Disease.    Anion gap 10 5 - 15  Basic metabolic panel     Status: Abnormal   Collection Time: 05/05/17  9:50 AM  Result Value Ref Range   Sodium 141 135 - 145 mmol/L   Potassium 5.2 (H) 3.5 - 5.1 mmol/L    Comment: DELTA CHECK NOTED   Chloride 105 101 - 111 mmol/L   CO2 24 22 - 32 mmol/L   Glucose, Bld 148 (H) 65 - 99 mg/dL   BUN 72 (H) 6 - 20 mg/dL   Creatinine, Ser 6.05 (H) 0.61 - 1.24 mg/dL   Calcium 8.0 (L) 8.9 - 10.3 mg/dL   GFR calc non Af Amer 9 (L) >60 mL/min   GFR calc Af Amer 10 (L) >60 mL/min    Comment: (NOTE) The eGFR has been calculated using the CKD EPI equation. This calculation has not been validated in all  clinical situations. eGFR's persistently <60 mL/min signify possible Chronic Kidney Disease.    Anion gap 12 5 - 15  CEA     Status: Abnormal   Collection Time: 05/06/17  8:41 AM  Result Value Ref Range   CEA 5.0 (H) 0.0 - 4.7 ng/mL    Comment: (NOTE)       Roche ECLIA methodology       Nonsmokers  <3.9                                     Smokers     <5.6 Performed At: [facility] [place], [place] [id number] Lindon Romp MD HY:[id number]     ABGS No results for input(s): PHART, PO2ART, TCO2, HCO3 in the last 72 hours.  Invalid input(s): PCO2 CULTURES Recent Results (from the past 240 hour(s))  MRSA PCR Screening     Status: Abnormal   Collection Time: 05/01/17 11:22 AM  Result Value Ref Range Status   MRSA by PCR POSITIVE (A) NEGATIVE Final    Comment:        The GeneXpert MRSA Assay (FDA approved for NASAL specimens only), is one component of a comprehensive MRSA colonization surveillance program. It is not intended to diagnose MRSA infection nor to guide  or monitor treatment for MRSA infections. RESULT CALLED TO, READ BACK BY AND VERIFIED WITH: HYLTON L. AT 1406 ON [id number] BY THOMPSON S.    Studies/Results: No results found.  Medications: I have reviewed the patient's current medications.  Assesment:  Active Problems:   Mental retardation   Hyperlipidemia   Acute blood loss anemia   ESRD (end stage renal disease) (HCC)   Anemia in CKD (chronic kidney disease)   Colonic mass   Lower GI bleed   GI bleed   Dysplastic polyp of colon    Plan:  Medications reviewed Continue hemodialysis   As per surgery plan    LOS: 5 days   Zachary [name] 05/07/2017, 8:35 AM

## 2017-05-08 LAB — BASIC METABOLIC PANEL
Anion gap: 11 (ref 5–15)
BUN: 41 mg/dL — ABNORMAL HIGH (ref 6–20)
CO2: 23 mmol/L (ref 22–32)
Calcium: 7.4 mg/dL — ABNORMAL LOW (ref 8.9–10.3)
Chloride: 108 mmol/L (ref 101–111)
Creatinine, Ser: 4.12 mg/dL — ABNORMAL HIGH (ref 0.61–1.24)
GFR calc Af Amer: 16 mL/min — ABNORMAL LOW (ref >60)
GFR calc non Af Amer: 14 mL/min — ABNORMAL LOW (ref >60)
Glucose, Bld: 75 mg/dL (ref 65–99)
Potassium: 3.8 mmol/L (ref 3.5–5.1)
Sodium: 142 mmol/L (ref 135–145)

## 2017-05-08 MED ORDER — POLYETHYLENE GLYCOL 3350 17 G PO PACK
17.0000 g | PACK | Freq: Two times a day (BID) | ORAL | Status: DC
  Administered 2017-05-08 – 2017-05-14 (×8): 17 g via ORAL
  Filled 2017-05-08 (×9): qty 1

## 2017-05-08 NOTE — Care Management Important Message (Signed)
Important Message  Patient Details  Name: Zachary [name] MRN: [id number] Date of Birth: 06-Jan-1946   Medicare Important Message Given:  Yes    Sherald Barge, RN 05/08/2017, 12:48 PM

## 2017-05-08 NOTE — Progress Notes (Signed)
PT Cancellation Note  Patient Details Name: ANIAS BARTOL MRN: [id number] DOB: 1946-02-22   Cancelled Treatment:    Reason Eval/Treat Not Completed: Medical issues which prohibited therapy (Chart reviewed, RN consulted.  At this time K+ is outside of safe range for OOB c PT per [facility] policy. Note cites pending lab value for today, but no labs have posted for today's date. ) Will monitor remotely and attempt PT eval again at later date/time.   11:10 AM, 05/08/17 Etta Grandchild, PT, DPT Physical Therapist - [place] [phone] (Office)    Jakevious Hollister C 05/08/2017, 11:09 AM

## 2017-05-08 NOTE — Progress Notes (Signed)
Zachary [name] is a 71 y.o. male patient. 1. Lower GI bleed   2. Colonic mass   3. Colonoscopy causing post-procedural bleeding   4. DVT of leg (deep venous thrombosis) (HCC)    Past Medical History:  Diagnosis Date  . Abnormal CT scan, kidney 10/06/2011  . Acute pyelonephritis 10/07/2011  . Anemia    normocytic  . Anxiety    mental retardation  . Bladder wall thickening 10/06/2011  . BPH (benign prostatic hypertrophy)   . Diabetes mellitus   . Edema     history of lower extremity edema  . Heme positive stool   . Hydronephrosis   . Hyperkalemia   . Hyperlipidemia   . Hypernatremia   . Hypertension   . Hypothyroidism   . Impaired speech   . 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 MR (mental retardation)   . Obstructive uropathy   . 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 Poor historian poor historian  . Protein calorie malnutrition ([place])   . Pyelonephritis   . Renal failure (ARF), acute on chronic (HCC)   . Renal insufficiency    chronic history  . Smoking   . Uremia   . Urinary retention   . UTI (lower urinary tract infection) 10/06/2011  . UTI (lower urinary tract infection)    Current Facility-Administered Medications  Medication Dose Route Frequency Provider Last Rate Last Dose  . 0.9 %  sodium chloride infusion  100 mL Intravenous PRN Befekadu, Eli Phillips, MD      . 0.9 %  sodium chloride infusion  100 mL Intravenous PRN Befekadu, Belayenh, MD      . 0.9 %  sodium chloride infusion  100 mL Intravenous PRN Befekadu, Belayenh, MD      . 0.9 %  sodium chloride infusion  100 mL Intravenous PRN Fran Lowes, MD      . 0.9 %  sodium chloride infusion   Intravenous Continuous Aviva Signs, MD 75 mL/hr at 05/08/17 0127    . acetaminophen (TYLENOL) tablet 650 mg  650 mg Oral Q6H PRN Tat, David, MD       Or  . acetaminophen (TYLENOL) suppository 650 mg  650 mg Rectal Q6H PRN Tat, Shanon Brow, MD      . atorvastatin (LIPITOR) tablet 40 mg  40 mg Oral QPM Tat, Shanon Brow, MD   40  mg at 05/07/17 1701  . epoetin alfa (EPOGEN,PROCRIT) injection 2,000 Units  2,000 Units Intravenous Q T,Th,Sa-HD Liana Gerold, MD   2,000 Units at 05/07/17 1212  . feeding supplement (PRO-STAT 64) liquid 30 mL  30 mL Oral BID BM Tat, David, MD   30 mL at 05/07/17 1700  . fentaNYL (SUBLIMAZE) injection 50 mcg  50 mcg Intravenous Q2H PRN Aviva Signs, MD   50 mcg at 05/08/17 [phone]  . levothyroxine (SYNTHROID, LEVOTHROID) tablet 75 mcg  75 mcg Oral QAC breakfast Tat, Shanon Brow, MD   75 mcg at 05/08/17 0859  . lidocaine (PF) (XYLOCAINE) 1 % injection 5 mL  5 mL Intradermal PRN Fran Lowes, MD      . lidocaine-prilocaine (EMLA) cream 1 application  1 application Topical PRN Fran Lowes, MD      . multivitamin with minerals tablet 1 tablet  1 tablet Oral QPM Orson Eva, MD   1 tablet at 05/07/17 1701  . ondansetron (ZOFRAN) tablet 4 mg  4 mg Oral Q6H PRN Orson Eva, MD   4 mg at 05/06/17 2108   Or  . ondansetron [facility]) injection  4 mg  4 mg Intravenous Q6H PRN Orson Eva, MD   4 mg at 05/04/17 1746  . oxyCODONE (Oxy IR/ROXICODONE) immediate release tablet 5-10 mg  5-10 mg Oral Q4H PRN Aviva Signs, MD   10 mg at 05/08/17 0325  . pantoprazole (PROTONIX) EC tablet 80 mg  80 mg Oral Daily Tat, Shanon Brow, MD   80 mg at 05/08/17 0859  . pentafluoroprop-tetrafluoroeth (GEBAUERS) aerosol 1 application  1 application Topical PRN Fran Lowes, MD      . simethicone (MYLICON) chewable tablet 40 mg  40 mg Oral Q6H PRN Aviva Signs, MD      . tamsulosin [facility]) capsule 0.4 mg  0.4 mg Oral Q2000 Tat, David, MD   0.4 mg at 05/07/17 2232  . torsemide (DEMADEX) tablet 50 mg  50 mg Oral Daily Tat, David, MD   50 mg at 05/08/17 9357   No Known Allergies Active Problems:   Mental retardation   Hyperlipidemia   Acute blood loss anemia   ESRD (end stage renal disease) (HCC)   Anemia in CKD (chronic kidney disease)   Colonic mass   Lower GI bleed   GI bleed   Dysplastic polyp of colon  Blood  pressure (!) 122/56, pulse (!) 117, temperature 99.4 F (37.4 C), temperature source Oral, resp. rate 18, height 6' (1.829 m), weight 194 lb 7.1 oz (88.2 kg), SpO2 97 %.  Subjective: Symptoms:  Stable.  He reports anorexia.   Diet:  Poor intake.   Pain:  He complains of pain that is mild.  He reports pain is unchanged.  Pain is well controlled.    Still with min PO intake.  No c/o pain, +BM ObjectiveGEN: NAD ABD: Soft, mildly distended, midline dressing dry Assessment:  Condition: In stable condition.  Unchanged.     Plan:  Out of bed and up to chair.  Advance diet as tolerated.  (Encourage PO intake, will start miralax BID as uncertain if bowel function is accurate as BM not witnessed by RN. ).      Andres Labrum 05/08/2017

## 2017-05-08 NOTE — Progress Notes (Signed)
Subjective: Patient is resting. He slight pain and discomfort in his abdomen. His po intake remained poor. His still on liquid diet. Claims he moved his bowel yesterday. He has not ambulated yet. He had his dialysis yesterday. Objective: Vital signs in last 24 hours: Temp:  [98.6 F (37 C)-100 F (37.8 C)] 99.4 F (37.4 C) (07/13 0520) Pulse Rate:  [102-117] 117 (07/13 0520) Resp:  [10-18] 18 (07/13 0520) BP: (105-127)/(56-69) 122/56 (07/13 0520) SpO2:  [97 %-100 %] 97 % (07/13 0520) Weight:  [88.2 kg (194 lb 7.1 oz)] 88.2 kg (194 lb 7.1 oz) (07/12 0830) Weight change: 0 kg (0 lb) Last BM Date: 05/07/17  Intake/Output from previous day: 07/12 0701 - 07/13 0700 In: 1215 [P.O.:240; I.V.:975] Out: 2200 [Urine:200]  PHYSICAL EXAM General appearance: alert and no distress Resp: clear to auscultation bilaterally Cardio: S1, S2 normal GI: soft, non-tender; bowel sounds normal; no masses,  no organomegaly and midline surgical incision dressed Extremities: extremities normal, atraumatic, no cyanosis or edema  Lab Results:  Results for orders placed or performed during the hospital encounter of 05/01/17 (from the past 48 hour(s))  CEA     Status: Abnormal   Collection Time: 05/06/17  8:41 AM  Result Value Ref Range   CEA 5.0 (H) 0.0 - 4.7 ng/mL    Comment: (NOTE)       Roche ECLIA methodology       Nonsmokers  <3.9                                     Smokers     <5.6 Performed At: [facility] [place], [place] [id number] Lindon Romp MD EQ:[id number]   Renal function panel     Status: Abnormal   Collection Time: 05/07/17 10:48 AM  Result Value Ref Range   Sodium 140 135 - 145 mmol/L   Potassium 2.8 (L) 3.5 - 5.1 mmol/L   Chloride 102 101 - 111 mmol/L   CO2 28 22 - 32 mmol/L   Glucose, Bld 93 65 - 99 mg/dL   BUN 21 (H) 6 - 20 mg/dL   Creatinine, Ser 2.09 (H) 0.61 - 1.24 mg/dL   Calcium 8.3 (L) 8.9 - 10.3 mg/dL   Phosphorus 2.2 (L) 2.5 - 4.6 mg/dL    Albumin 2.8 (L) 3.5 - 5.0 g/dL   GFR calc non Af Amer 31 (L) >60 mL/min   GFR calc Af Amer 36 (L) >60 mL/min    Comment: (NOTE) The eGFR has been calculated using the CKD EPI equation. This calculation has not been validated in all clinical situations. eGFR's persistently <60 mL/min signify possible Chronic Kidney Disease.    Anion gap 10 5 - 15  CBC with Differential/Platelet     Status: Abnormal   Collection Time: 05/07/17 10:48 AM  Result Value Ref Range   WBC 9.2 4.0 - 10.5 K/uL   RBC 2.86 (L) 4.22 - 5.81 MIL/uL   Hemoglobin 9.0 (L) 13.0 - 17.0 g/dL   HCT 27.2 (L) 39.0 - 52.0 %   MCV 95.1 78.0 - 100.0 fL   MCH 31.5 26.0 - 34.0 pg   MCHC 33.1 30.0 - 36.0 g/dL   RDW 17.6 (H) 11.5 - 15.5 %   Platelets 197 150 - 400 K/uL   Neutrophils Relative % 76 %   Neutro Abs 7.0 1.7 - 7.7 K/uL   Lymphocytes Relative 9 %  Lymphs Abs 0.8 0.7 - 4.0 K/uL   Monocytes Relative 11 %   Monocytes Absolute 1.0 0.1 - 1.0 K/uL   Eosinophils Relative 4 %   Eosinophils Absolute 0.3 0.0 - 0.7 K/uL   Basophils Relative 0 %   Basophils Absolute 0.0 0.0 - 0.1 K/uL  CBC with Differential/Platelet     Status: Abnormal   Collection Time: 05/07/17 12:08 PM  Result Value Ref Range   WBC 9.8 4.0 - 10.5 K/uL   RBC 3.02 (L) 4.22 - 5.81 MIL/uL   Hemoglobin 9.4 (L) 13.0 - 17.0 g/dL   HCT 29.0 (L) 39.0 - 52.0 %   MCV 96.0 78.0 - 100.0 fL   MCH 31.1 26.0 - 34.0 pg   MCHC 32.4 30.0 - 36.0 g/dL   RDW 17.7 (H) 11.5 - 15.5 %   Platelets 216 150 - 400 K/uL   Neutrophils Relative % 77 %   Neutro Abs 7.6 1.7 - 7.7 K/uL   Lymphocytes Relative 8 %   Lymphs Abs 0.8 0.7 - 4.0 K/uL   Monocytes Relative 11 %   Monocytes Absolute 1.1 (H) 0.1 - 1.0 K/uL   Eosinophils Relative 4 %   Eosinophils Absolute 0.4 0.0 - 0.7 K/uL   Basophils Relative 0 %   Basophils Absolute 0.0 0.0 - 0.1 K/uL  Renal function panel     Status: Abnormal   Collection Time: 05/07/17 12:08 PM  Result Value Ref Range   Sodium 138 135 - 145  mmol/L   Potassium 2.6 (LL) 3.5 - 5.1 mmol/L    Comment: CRITICAL RESULT CALLED TO, READ BACK BY AND VERIFIED WITH: SPANGLER,E @ 1256 ON 7.12.18 BY BAYSE,L    Chloride 99 (L) 101 - 111 mmol/L   CO2 28 22 - 32 mmol/L   Glucose, Bld 87 65 - 99 mg/dL   BUN 14 6 - 20 mg/dL   Creatinine, Ser 1.60 (H) 0.61 - 1.24 mg/dL   Calcium 8.5 (L) 8.9 - 10.3 mg/dL   Phosphorus 1.7 (L) 2.5 - 4.6 mg/dL   Albumin 3.0 (L) 3.5 - 5.0 g/dL   GFR calc non Af Amer 43 (L) >60 mL/min   GFR calc Af Amer 49 (L) >60 mL/min    Comment: (NOTE) The eGFR has been calculated using the CKD EPI equation. This calculation has not been validated in all clinical situations. eGFR's persistently <60 mL/min signify possible Chronic Kidney Disease.    Anion gap 11 5 - 15    ABGS No results for input(s): PHART, PO2ART, TCO2, HCO3 in the last 72 hours.  Invalid input(s): PCO2 CULTURES Recent Results (from the past 240 hour(s))  MRSA PCR Screening     Status: Abnormal   Collection Time: 05/01/17 11:22 AM  Result Value Ref Range Status   MRSA by PCR POSITIVE (A) NEGATIVE Final    Comment:        The GeneXpert MRSA Assay (FDA approved for NASAL specimens only), is one component of a comprehensive MRSA colonization surveillance program. It is not intended to diagnose MRSA infection nor to guide or monitor treatment for MRSA infections. RESULT CALLED TO, READ BACK BY AND VERIFIED WITH: HYLTON L. AT 1406 ON [id number] BY THOMPSON S.    Studies/Results: No results found.  Medications: I have reviewed the patient's current medications.  Assesment:  Active Problems:   Mental retardation   Hyperlipidemia   Acute blood loss anemia   ESRD (end stage renal disease) (HCC)   Anemia in CKD (chronic kidney  disease)   Colonic mass   Lower GI bleed   GI bleed   Dysplastic polyp of colon    Plan:  Medications reviewed Continue hemodialysis Physical therapy BMP   As per surgery plan    LOS: 6 days    Zachary [name] 05/08/2017, 8:25 AM

## 2017-05-08 NOTE — Evaluation (Signed)
Physical Therapy Evaluation Patient Details Name: CHANAN DETWILER MRN: [id number] DOB: October 25, 1946 Today's Date: 05/08/2017   History of Present Illness  Marguerite Jarboe is a 71yo black male who comes to [facility] on 7/6 d/t low Hb <6.0. He is now s/p transfusion, Hb stable, and s/p total colectomy (7/9). PMH: DM, ID (intellectual disability), HTN, ESRD on HD T/R/Sa, s/p Rt distal femur fracture and ORIF (Jan 2018).   Clinical Impression  Pt admitted with above diagnosis. Pt currently with functional limitations due to the deficits listed below (see "PT Problem List"). Upon entry, the patient is received semirecumbent in bed, no guardian present. The pt is awake and initially not agreeable to participate, citing prior episodes of presyncope and abd pain, however RN encourages the patient and rationalizes with him with education on the risks of prolonged bed rest. Pt is eventually agreeable to participate. Pain is controlled at rest, but is limiting during mobility. The patient requires heavy physical assistance for bed mobility and transfer to standing, and is only tolerant of <5 feet amb, which results in worsening dizziness and nausea. Pt will benefit from skilled PT intervention to increase independence and safety with basic mobility in preparation for discharge to the venue listed below.       Follow Up Recommendations SNF    Equipment Recommendations  None recommended by PT    Recommendations for Other Services       Precautions / Restrictions Precautions Precautions: Fall Restrictions Weight Bearing Restrictions: No      Mobility  Bed Mobility Overal bed mobility: Needs Assistance Bed Mobility: Supine to Sit     Supine to sit: Mod assist;+2 for physical assistance     General bed mobility comments: requires physical assist for trunk flexion and pivot to EOB, high levels of pain inhibition, decreased ability to self assist with BUE.   Transfers Overall transfer level: Needs  assistance Equipment used: Rolling walker (2 wheeled) Transfers: Sit to/from Stand Sit to Stand: Max assist;From elevated surface         General transfer comment: very slow, high effort, high level of physical assistance needed; dizziness upon standing.   Ambulation/Gait Ambulation/Gait assistance: Min guard Ambulation Distance (Feet): 2 Feet Assistive device: Rolling walker (2 wheeled)       General Gait Details: hesitant, slow, mild instability, with decreased utility of RW use. poor control od descent to chair; pt immediately nauseated, asking for vessel to collect anticipated emesis (appears to be saliva only)   Stairs            Wheelchair Mobility    Modified Rankin (Stroke Patients Only)       Balance Overall balance assessment: Needs assistance Sitting-balance support: Single extremity supported;Feet supported Sitting balance-Leahy Scale: Fair     Standing balance support: During functional activity;Bilateral upper extremity supported Standing balance-Leahy Scale: Poor                               Pertinent Vitals/Pain Pain Assessment: Faces Faces Pain Scale: Hurts even more Pain Location: abd  Pain Intervention(s): Limited activity within patient's tolerance;Monitored during session;Repositioned (worse with mobility, decreases at rest)    Home Living Family/patient expects to be discharged to:: Skilled nursing facility                      Prior Function Level of Independence: Needs assistance   Gait / Transfers Assistance Needed: Reports he has  been amb with RW.   ADL's / Homemaking Assistance Needed: requires assistance due to ID (intellectual disability)         Hand Dominance        Extremity/Trunk Assessment   Upper Extremity Assessment Upper Extremity Assessment: Generalized weakness;LUE deficits/detail LUE Deficits / Details: decreased selective use, no in depth assessment made, does eventually use with RW.      Lower Extremity Assessment Lower Extremity Assessment: Generalized weakness;RLE deficits/detail RLE Deficits / Details: decreased knee flexion ROM, decreased RLE contribution to weightbearing in transfers and amb        Communication   Communication: Other (comment) (stuttering/dysathria. )  Cognition Arousal/Alertness: Awake/alert Behavior During Therapy: WFL for tasks assessed/performed;Anxious Overall Cognitive Status: History of cognitive impairments - at baseline Area of Impairment: Awareness;Following commands                       Following Commands: Follows one step commands consistently   Awareness: Intellectual Problem Solving: Slow processing General Comments: mild anxiety related to presyncope since admitted, as well as postop pain. Pt requires heavy encouragement to overcome hesitance.       General Comments      Exercises     Assessment/Plan    PT Assessment Patient needs continued PT services  PT Problem List Decreased strength;Decreased mobility;Decreased activity tolerance;Decreased safety awareness;Decreased knowledge of use of DME;Decreased knowledge of precautions;Pain       PT Treatment Interventions Gait training;Functional mobility training;Therapeutic activities;Therapeutic exercise;Balance training;Patient/family education    PT Goals (Current goals can be found in the Care Plan section)  Acute Rehab PT Goals Patient Stated Goal: decrease pain, regain strength  PT Goal Formulation: With patient Time For Goal Achievement: 05/22/17 Potential to Achieve Goals: Fair    Frequency Min 2X/week   Barriers to discharge        Co-evaluation               AM-PAC PT "6 Clicks" Daily Activity  Outcome Measure Difficulty turning over in bed (including adjusting bedclothes, sheets and blankets)?: Total Difficulty moving from lying on back to sitting on the side of the bed? : Total Difficulty sitting down on and standing up from a  chair with arms (e.g., wheelchair, bedside commode, etc,.)?: Total Help needed moving to and from a bed to chair (including a wheelchair)?: Total Help needed walking in hospital room?: Total Help needed climbing 3-5 steps with a railing? : Total 6 Click Score: 6    End of Session Equipment Utilized During Treatment: Gait belt Activity Tolerance: Patient limited by fatigue;Patient limited by pain;Other (comment) (dizziness, nausea) Patient left: in chair;with call bell/phone within reach Nurse Communication: Mobility status (nausea,) PT Visit Diagnosis: Muscle weakness (generalized) (M62.81);Difficulty in walking, not elsewhere classified (R26.2)    Time: 1340-1411 PT Time Calculation (min) (ACUTE ONLY): 31 min   Charges:   PT Evaluation $PT Eval Moderate Complexity: 1 Procedure PT Treatments $Therapeutic Activity: 8-22 mins   PT G Codes:       2:33 PM, May 29, 2017 Etta Grandchild, PT, DPT Physical Therapist - [place] [phone] (Office)    Lynnel Zanetti C 05/29/17, 2:29 PM

## 2017-05-08 NOTE — Progress Notes (Signed)
Zachary [name]  MRN: [id number]  DOB/AGE: 1946/07/01 71 y.o.  Primary Care Physician:Fanta, Tesfaye, MD  Admit date: 05/01/2017  Chief Complaint:  Chief Complaint  Patient presents with  . Abnormal Lab    low hgb    S-Pt presented on  05/01/2017 with  Chief Complaint  Patient presents with  . Abnormal Lab    low hgb  .    Pt offers no new complaints    Pt's brother is present in the room.   Meds . atorvastatin  40 mg Oral QPM  . epoetin (EPOGEN/PROCRIT) injection  2,000 Units Intravenous Q T,Th,Sa-HD  . feeding supplement (PRO-STAT 64)  30 mL Oral BID BM  . levothyroxine  75 mcg Oral QAC breakfast  . multivitamin with minerals  1 tablet Oral QPM  . pantoprazole  80 mg Oral Daily  . polyethylene glycol  17 g Oral BID  . tamsulosin  0.4 mg Oral Q2000  . torsemide  50 mg Oral Daily     Physical Exam: Vital signs in last 24 hours: Temp:  [99.4 F (37.4 C)-100 F (37.8 C)] 99.4 F (37.4 C) (07/13 0520) Pulse Rate:  [113-117] 117 (07/13 0520) Resp:  [16-18] 18 (07/13 0520) BP: (105-122)/(56) 122/56 (07/13 0520) SpO2:  [97 %-99 %] 97 % (07/13 0520) Weight change: 0 lb (0 kg) Last BM Date: 05/07/17  Intake/Output from previous day: 07/12 0701 - 07/13 0700 In: 1215 [P.O.:240; I.V.:975] Out: 2200 [Urine:200] Total I/O In: 120 [P.O.:120] Out: -    Physical Exam: General- pt is awake, follow commands Resp- No acute REsp distress, NO Rhonchi CVS- S1S2 regular in rate and rhythm GIT- BS+, staples in situ, EXT- NO LE Edema, NO Cyanosis Access- AVF +  Lab Results: CBC  Recent Labs  05/07/17 1048 05/07/17 1208  WBC 9.2 9.8  HGB 9.0* 9.4*  HCT 27.2* 29.0*  PLT 197 216    BMET  Recent Labs  05/07/17 1208 05/08/17 1038  NA 138 142  K 2.6* 3.8  CL 99* 108  CO2 28 23  GLUCOSE 87 75  BUN 14 41*  CREATININE 1.60* 4.12*  CALCIUM 8.5* 7.4*     MICRO Recent Results (from the past 240 hour(s))  MRSA PCR Screening     Status: Abnormal   Collection  Time: 05/01/17 11:22 AM  Result Value Ref Range Status   MRSA by PCR POSITIVE (A) NEGATIVE Final    Comment:        The GeneXpert MRSA Assay (FDA approved for NASAL specimens only), is one component of a comprehensive MRSA colonization surveillance program. It is not intended to diagnose MRSA infection nor to guide or monitor treatment for MRSA infections. RESULT CALLED TO, READ BACK BY AND VERIFIED WITH: HYLTON L. AT 1406 ON [id number] BY THOMPSON S.       Lab Results  Component Value Date   PTH 61 10/09/2015   CALCIUM 7.4 (L) 05/08/2017   CAION 1.24 10/08/2015   PHOS 1.7 (L) 05/07/2017                Impression: 1)Renal  ESRD on HD                Pt is on TTS schedule as outpt                Pt was last dialyzed yesterday                NO need of HD today  2)HTN  BP stable  3)Anemia IN ESRD the goal for HGB is 9-11 Will keep on EPO during hd.   4)CKD Mineral-Bone Disorder Calcium when corrected for low albumin is at goal Phosphorus not  at goal.   On lower side sec to poor po intake    Not on any bninders   5)CNS-Hx of mental Retardation  PMD following  6)Electrolytes  Hyperkalemic   Hypokalemic labs were intra dialytic   Now better Normonatremia    7)Acid base Co2  at goal.  8) GI-pt is s/p total colectomy Sx following  Plan:  Will dialyze in am Will continue current care NO need of HD today    [name] S 05/08/2017, 4:39 PM

## 2017-05-09 ENCOUNTER — Inpatient Hospital Stay (HOSPITAL_COMMUNITY): Payer: Medicare Other

## 2017-05-09 LAB — CBC WITH DIFFERENTIAL/PLATELET
Basophils Absolute: 0 10*3/uL (ref 0.0–0.1)
Basophils Relative: 0 %
Eosinophils Absolute: 0.3 10*3/uL (ref 0.0–0.7)
Eosinophils Relative: 4 %
HCT: 26.8 % — ABNORMAL LOW (ref 39.0–52.0)
Hemoglobin: 8.7 g/dL — ABNORMAL LOW (ref 13.0–17.0)
Lymphocytes Relative: 10 %
Lymphs Abs: 0.9 10*3/uL (ref 0.7–4.0)
MCH: 31.8 pg (ref 26.0–34.0)
MCHC: 32.5 g/dL (ref 30.0–36.0)
MCV: 97.8 fL (ref 78.0–100.0)
Monocytes Absolute: 1.3 10*3/uL — ABNORMAL HIGH (ref 0.1–1.0)
Monocytes Relative: 16 %
Neutro Abs: 5.9 10*3/uL (ref 1.7–7.7)
Neutrophils Relative %: 70 %
Platelets: 246 10*3/uL (ref 150–400)
RBC: 2.74 MIL/uL — ABNORMAL LOW (ref 4.22–5.81)
RDW: 17.1 % — ABNORMAL HIGH (ref 11.5–15.5)
WBC: 8.5 10*3/uL (ref 4.0–10.5)

## 2017-05-09 LAB — RENAL FUNCTION PANEL
Albumin: 2.3 g/dL — ABNORMAL LOW (ref 3.5–5.0)
Anion gap: 12 (ref 5–15)
BUN: 61 mg/dL — ABNORMAL HIGH (ref 6–20)
CO2: 24 mmol/L (ref 22–32)
Calcium: 8.3 mg/dL — ABNORMAL LOW (ref 8.9–10.3)
Chloride: 108 mmol/L (ref 101–111)
Creatinine, Ser: 5.45 mg/dL — ABNORMAL HIGH (ref 0.61–1.24)
GFR calc Af Amer: 11 mL/min — ABNORMAL LOW (ref >60)
GFR calc non Af Amer: 10 mL/min — ABNORMAL LOW (ref >60)
Glucose, Bld: 128 mg/dL — ABNORMAL HIGH (ref 65–99)
Phosphorus: 5.9 mg/dL — ABNORMAL HIGH (ref 2.5–4.6)
Potassium: 4 mmol/L (ref 3.5–5.1)
Sodium: 144 mmol/L (ref 135–145)

## 2017-05-09 MED ORDER — METOCLOPRAMIDE HCL 5 MG/ML IJ SOLN
10.0000 mg | Freq: Three times a day (TID) | INTRAMUSCULAR | Status: DC
  Administered 2017-05-09 – 2017-05-15 (×15): 10 mg via INTRAVENOUS
  Filled 2017-05-09 (×15): qty 2

## 2017-05-09 MED ORDER — EPOETIN ALFA 2000 UNIT/ML IJ SOLN
INTRAMUSCULAR | Status: AC
  Administered 2017-05-09: 2000 [IU] via INTRAVENOUS
  Filled 2017-05-09: qty 1

## 2017-05-09 NOTE — Progress Notes (Signed)
Zachary [name]  MRN: [id number]  DOB/AGE: 69-Jan-1947 69 y.o.  Primary Care Physician:Fanta, Tesfaye, MD  Admit date: 05/01/2017  Chief Complaint:  Chief Complaint  Patient presents with  . Abnormal Lab    low hgb    S-Pt presented on  05/01/2017 with  Chief Complaint  Patient presents with  . Abnormal Lab    low hgb  .    Pt offers no new complaints of pain.   Pt's main concern is " I do not like food here".     Pt is asking for Chicken pot pie, Pt surgeon is so nice that he promised to get him some.   Meds . atorvastatin  40 mg Oral QPM  . epoetin (EPOGEN/PROCRIT) injection  2,000 Units Intravenous Q T,Th,Sa-HD  . feeding supplement (PRO-STAT 64)  30 mL Oral BID BM  . levothyroxine  75 mcg Oral QAC breakfast  . multivitamin with minerals  1 tablet Oral QPM  . pantoprazole  80 mg Oral Daily  . polyethylene glycol  17 g Oral BID  . tamsulosin  0.4 mg Oral Q2000  . torsemide  50 mg Oral Daily     Physical Exam: Vital signs in last 24 hours: Temp:  [99.5 F (37.5 C)] 99.5 F (37.5 C) (07/13 2213) Resp:  [16] 16 (07/13 2213) BP: (113)/(58) 113/58 (07/13 2213) SpO2:  [97 %] 97 % (07/13 2213) Weight change:  Last BM Date: 05/07/17  Intake/Output from previous day: 07/13 0701 - 07/14 0700 In: 120 [P.O.:120] Out: -  No intake/output data recorded.   Physical Exam: General- pt is awake, follow commands Resp- No acute REsp distress, NO Rhonchi CVS- S1S2 regular in rate and rhythm GIT- BS+, staples in situ, EXT- NO LE Edema, NO Cyanosis Access- AVF +  Lab Results: CBC  Recent Labs  05/07/17 1048 05/07/17 1208  WBC 9.2 9.8  HGB 9.0* 9.4*  HCT 27.2* 29.0*  PLT 197 216    BMET  Recent Labs  05/07/17 1208 05/08/17 1038  NA 138 142  K 2.6* 3.8  CL 99* 108  CO2 28 23  GLUCOSE 87 75  BUN 14 41*  CREATININE 1.60* 4.12*  CALCIUM 8.5* 7.4*     MICRO Recent Results (from the past 240 hour(s))  MRSA PCR Screening     Status: Abnormal   Collection  Time: 05/01/17 11:22 AM  Result Value Ref Range Status   MRSA by PCR POSITIVE (A) NEGATIVE Final    Comment:        The GeneXpert MRSA Assay (FDA approved for NASAL specimens only), is one component of a comprehensive MRSA colonization surveillance program. It is not intended to diagnose MRSA infection nor to guide or monitor treatment for MRSA infections. RESULT CALLED TO, READ BACK BY AND VERIFIED WITH: HYLTON L. AT 1406 ON [id number] BY THOMPSON S.       Lab Results  Component Value Date   PTH 61 10/09/2015   CALCIUM 7.4 (L) 05/08/2017   CAION 1.24 10/08/2015   PHOS 1.7 (L) 05/07/2017                Impression: 1)Renal  ESRD on HD                Pt is on TTS schedule as outpt                Pt will be dialyzed today  2)HTN  BP stable  3)Anemia IN ESRD the goal for HGB is  9-11 on EPO during hd.   4)CKD Mineral-Bone Disorder Calcium when corrected for low albumin is at goal Phosphorus not  at goal.   On lower side sec to poor po intake    Not on any bninders   5)CNS-Hx of mental Retardation  PMD following  6)Electrolytes  Hyperkalemic   Hypokalemic labs were intra dialytic   Now better Normonatremia    7)Acid base Co2  at goal.  8) GI-pt is s/p total colectomy Sx following  Plan:  Will dialyze today Will use 3k bath Will keep on epo   BHUTANI,MANPREET S 05/09/2017, 8:16 AM

## 2017-05-09 NOTE — Progress Notes (Signed)
Subjective: Patient is alert and awake. He is trying liquid diet but his po intake remained poor. He is complaining of abdominal discomfort. Objective: Vital signs in last 24 hours: Temp:  [99.5 F (37.5 C)] 99.5 F (37.5 C) (07/13 2213) Resp:  [16] 16 (07/13 2213) BP: (113)/(58) 113/58 (07/13 2213) SpO2:  [97 %] 97 % (07/13 2213) Weight change:  Last BM Date: 05/07/17  Intake/Output from previous day: 07/13 0701 - 07/14 0700 In: 120 [P.O.:120] Out: -   PHYSICAL EXAM General appearance: alert and no distress Resp: clear to auscultation bilaterally Cardio: S1, S2 normal GI: soft, non-tender; bowel sounds normal; no masses,  no organomegaly and midline surgical incision dressed Extremities: extremities normal, atraumatic, no cyanosis or edema  Lab Results:  Results for orders placed or performed during the hospital encounter of 05/01/17 (from the past 48 hour(s))  Renal function panel     Status: Abnormal   Collection Time: 05/07/17 10:48 AM  Result Value Ref Range   Sodium 140 135 - 145 mmol/L   Potassium 2.8 (L) 3.5 - 5.1 mmol/L   Chloride 102 101 - 111 mmol/L   CO2 28 22 - 32 mmol/L   Glucose, Bld 93 65 - 99 mg/dL   BUN 21 (H) 6 - 20 mg/dL   Creatinine, Ser 2.09 (H) 0.61 - 1.24 mg/dL   Calcium 8.3 (L) 8.9 - 10.3 mg/dL   Phosphorus 2.2 (L) 2.5 - 4.6 mg/dL   Albumin 2.8 (L) 3.5 - 5.0 g/dL   GFR calc non Af Amer 31 (L) >60 mL/min   GFR calc Af Amer 36 (L) >60 mL/min    Comment: (NOTE) The eGFR has been calculated using the CKD EPI equation. This calculation has not been validated in all clinical situations. eGFR's persistently <60 mL/min signify possible Chronic Kidney Disease.    Anion gap 10 5 - 15  CBC with Differential/Platelet     Status: Abnormal   Collection Time: 05/07/17 10:48 AM  Result Value Ref Range   WBC 9.2 4.0 - 10.5 K/uL   RBC 2.86 (L) 4.22 - 5.81 MIL/uL   Hemoglobin 9.0 (L) 13.0 - 17.0 g/dL   HCT 27.2 (L) 39.0 - 52.0 %   MCV 95.1 78.0 - 100.0  fL   MCH 31.5 26.0 - 34.0 pg   MCHC 33.1 30.0 - 36.0 g/dL   RDW 17.6 (H) 11.5 - 15.5 %   Platelets 197 150 - 400 K/uL   Neutrophils Relative % 76 %   Neutro Abs 7.0 1.7 - 7.7 K/uL   Lymphocytes Relative 9 %   Lymphs Abs 0.8 0.7 - 4.0 K/uL   Monocytes Relative 11 %   Monocytes Absolute 1.0 0.1 - 1.0 K/uL   Eosinophils Relative 4 %   Eosinophils Absolute 0.3 0.0 - 0.7 K/uL   Basophils Relative 0 %   Basophils Absolute 0.0 0.0 - 0.1 K/uL  CBC with Differential/Platelet     Status: Abnormal   Collection Time: 05/07/17 12:08 PM  Result Value Ref Range   WBC 9.8 4.0 - 10.5 K/uL   RBC 3.02 (L) 4.22 - 5.81 MIL/uL   Hemoglobin 9.4 (L) 13.0 - 17.0 g/dL   HCT 29.0 (L) 39.0 - 52.0 %   MCV 96.0 78.0 - 100.0 fL   MCH 31.1 26.0 - 34.0 pg   MCHC 32.4 30.0 - 36.0 g/dL   RDW 17.7 (H) 11.5 - 15.5 %   Platelets 216 150 - 400 K/uL   Neutrophils Relative % 77 %  Neutro Abs 7.6 1.7 - 7.7 K/uL   Lymphocytes Relative 8 %   Lymphs Abs 0.8 0.7 - 4.0 K/uL   Monocytes Relative 11 %   Monocytes Absolute 1.1 (H) 0.1 - 1.0 K/uL   Eosinophils Relative 4 %   Eosinophils Absolute 0.4 0.0 - 0.7 K/uL   Basophils Relative 0 %   Basophils Absolute 0.0 0.0 - 0.1 K/uL  Renal function panel     Status: Abnormal   Collection Time: 05/07/17 12:08 PM  Result Value Ref Range   Sodium 138 135 - 145 mmol/L   Potassium 2.6 (LL) 3.5 - 5.1 mmol/L    Comment: CRITICAL RESULT CALLED TO, READ BACK BY AND VERIFIED WITH: SPANGLER,E @ 1256 ON 7.12.18 BY BAYSE,L    Chloride 99 (L) 101 - 111 mmol/L   CO2 28 22 - 32 mmol/L   Glucose, Bld 87 65 - 99 mg/dL   BUN 14 6 - 20 mg/dL   Creatinine, Ser 1.60 (H) 0.61 - 1.24 mg/dL   Calcium 8.5 (L) 8.9 - 10.3 mg/dL   Phosphorus 1.7 (L) 2.5 - 4.6 mg/dL   Albumin 3.0 (L) 3.5 - 5.0 g/dL   GFR calc non Af Amer 43 (L) >60 mL/min   GFR calc Af Amer 49 (L) >60 mL/min    Comment: (NOTE) The eGFR has been calculated using the CKD EPI equation. This calculation has not been validated in  all clinical situations. eGFR's persistently <60 mL/min signify possible Chronic Kidney Disease.    Anion gap 11 5 - 15  Basic metabolic panel     Status: Abnormal   Collection Time: 05/08/17 10:38 AM  Result Value Ref Range   Sodium 142 135 - 145 mmol/L   Potassium 3.8 3.5 - 5.1 mmol/L    Comment: DELTA CHECK NOTED   Chloride 108 101 - 111 mmol/L   CO2 23 22 - 32 mmol/L   Glucose, Bld 75 65 - 99 mg/dL   BUN 41 (H) 6 - 20 mg/dL   Creatinine, Ser 4.12 (H) 0.61 - 1.24 mg/dL    Comment: DELTA CHECK NOTED   Calcium 7.4 (L) 8.9 - 10.3 mg/dL   GFR calc non Af Amer 14 (L) >60 mL/min   GFR calc Af Amer 16 (L) >60 mL/min    Comment: (NOTE) The eGFR has been calculated using the CKD EPI equation. This calculation has not been validated in all clinical situations. eGFR's persistently <60 mL/min signify possible Chronic Kidney Disease.    Anion gap 11 5 - 15    ABGS No results for input(s): PHART, PO2ART, TCO2, HCO3 in the last 72 hours.  Invalid input(s): PCO2 CULTURES Recent Results (from the past 240 hour(s))  MRSA PCR Screening     Status: Abnormal   Collection Time: 05/01/17 11:22 AM  Result Value Ref Range Status   MRSA by PCR POSITIVE (A) NEGATIVE Final    Comment:        The GeneXpert MRSA Assay (FDA approved for NASAL specimens only), is one component of a comprehensive MRSA colonization surveillance program. It is not intended to diagnose MRSA infection nor to guide or monitor treatment for MRSA infections. RESULT CALLED TO, READ BACK BY AND VERIFIED WITH: HYLTON L. AT 1406 ON [id number] BY THOMPSON S.    Studies/Results: No results found.  Medications: I have reviewed the patient's current medications.  Assesment:  Active Problems:   Mental retardation   Hyperlipidemia   Acute blood loss anemia   ESRD (end stage  renal disease) ([place])   Anemia in CKD (chronic kidney disease)   Colonic mass   Lower GI bleed   GI bleed   Dysplastic polyp of  colon    Plan:  Medications reviewed Continue hemodialysis Will discharge to SNF according PT recommendation when bed is available   As per surgery plan    LOS: 7 days   Cybill Uriegas 05/09/2017, 8:01 AM

## 2017-05-09 NOTE — Progress Notes (Signed)
Zachary [name] is a 71 y.o. male patient. 1. Lower GI bleed   2. Colonic mass   3. Colonoscopy causing post-procedural bleeding   4. DVT of leg (deep venous thrombosis) (HCC)    Past Medical History:  Diagnosis Date  . Abnormal CT scan, kidney 10/06/2011  . Acute pyelonephritis 10/07/2011  . Anemia    normocytic  . Anxiety    mental retardation  . Bladder wall thickening 10/06/2011  . BPH (benign prostatic hypertrophy)   . Diabetes mellitus   . Edema     history of lower extremity edema  . Heme positive stool   . Hydronephrosis   . Hyperkalemia   . Hyperlipidemia   . Hypernatremia   . Hypertension   . Hypothyroidism   . Impaired speech   . 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 MR (mental retardation)   . Obstructive uropathy   . 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 Poor historian poor historian  . Protein calorie malnutrition ([place])   . Pyelonephritis   . Renal failure (ARF), acute on chronic (HCC)   . Renal insufficiency    chronic history  . Smoking   . Uremia   . Urinary retention   . UTI (lower urinary tract infection) 10/06/2011  . UTI (lower urinary tract infection)    Current Facility-Administered Medications  Medication Dose Route Frequency Provider Last Rate Last Dose  . 0.9 %  sodium chloride infusion  100 mL Intravenous PRN Befekadu, Eli Phillips, MD      . 0.9 %  sodium chloride infusion  100 mL Intravenous PRN Befekadu, Belayenh, MD      . 0.9 %  sodium chloride infusion  100 mL Intravenous PRN Befekadu, Belayenh, MD      . 0.9 %  sodium chloride infusion  100 mL Intravenous PRN Fran Lowes, MD      . 0.9 %  sodium chloride infusion   Intravenous Continuous Aviva Signs, MD 75 mL/hr at 05/09/17 0825    . acetaminophen (TYLENOL) tablet 650 mg  650 mg Oral Q6H PRN Tat, David, MD       Or  . acetaminophen (TYLENOL) suppository 650 mg  650 mg Rectal Q6H PRN Tat, Shanon Brow, MD      . atorvastatin (LIPITOR) tablet 40 mg  40 mg Oral QPM Tat, David, MD   40  mg at 05/08/17 1710  . epoetin alfa (EPOGEN,PROCRIT) injection 2,000 Units  2,000 Units Intravenous Q T,Th,Sa-HD Liana Gerold, MD   2,000 Units at 05/07/17 1212  . feeding supplement (PRO-STAT 64) liquid 30 mL  30 mL Oral BID BM Orson Eva, MD   30 mL at 05/09/17 0826  . fentaNYL (SUBLIMAZE) injection 50 mcg  50 mcg Intravenous Q2H PRN Aviva Signs, MD   50 mcg at 05/08/17 [phone]  . levothyroxine (SYNTHROID, LEVOTHROID) tablet 75 mcg  75 mcg Oral QAC breakfast Tat, Shanon Brow, MD   75 mcg at 05/09/17 0825  . lidocaine (PF) (XYLOCAINE) 1 % injection 5 mL  5 mL Intradermal PRN Fran Lowes, MD      . lidocaine-prilocaine (EMLA) cream 1 application  1 application Topical PRN Fran Lowes, MD      . multivitamin with minerals tablet 1 tablet  1 tablet Oral QPM Orson Eva, MD   1 tablet at 05/08/17 1710  . ondansetron (ZOFRAN) tablet 4 mg  4 mg Oral Q6H PRN Orson Eva, MD   4 mg at 05/06/17 2108   Or  . ondansetron [facility]) injection  4 mg  4 mg Intravenous Q6H PRN Orson Eva, MD   4 mg at 05/04/17 1746  . oxyCODONE (Oxy IR/ROXICODONE) immediate release tablet 5-10 mg  5-10 mg Oral Q4H PRN Aviva Signs, MD   10 mg at 05/09/17 0825  . pantoprazole (PROTONIX) EC tablet 80 mg  80 mg Oral Daily Tat, Shanon Brow, MD   80 mg at 05/09/17 0825  . pentafluoroprop-tetrafluoroeth (GEBAUERS) aerosol 1 application  1 application Topical PRN Fran Lowes, MD      . polyethylene glycol (MIRALAX / GLYCOLAX) packet 17 g  17 g Oral BID Andres Labrum, MD   17 g at 05/09/17 0826  . simethicone (MYLICON) chewable tablet 40 mg  40 mg Oral Q6H PRN Aviva Signs, MD      . tamsulosin [facility]) capsule 0.4 mg  0.4 mg Oral Q2000 Orson Eva, MD   0.4 mg at 05/08/17 2132  . torsemide (DEMADEX) tablet 50 mg  50 mg Oral Daily Tat, David, MD   50 mg at 05/09/17 0825   No Known Allergies Active Problems:   Mental retardation   Hyperlipidemia   Acute blood loss anemia   ESRD (end stage renal disease) (HCC)   Anemia  in CKD (chronic kidney disease)   Colonic mass   Lower GI bleed   GI bleed   Dysplastic polyp of colon  Blood pressure (!) 113/58, pulse (!) 117, temperature 99.5 F (37.5 C), temperature source Oral, resp. rate 16, height 6' (1.829 m), weight 194 lb 7.1 oz (88.2 kg), SpO2 97 %.  Subjective: Symptoms:  Stable.  He reports anorexia.   Diet:  Poor intake.  He reports  nausea.  No vomiting.   Activity level: Impaired due to weakness.   Pain:  He reports no pain.    Objective: General Appearance:  Comfortable, well-appearing and in no acute distress.   Vital signs: (most recent): Blood pressure (!) 113/58, pulse (!) 117, temperature 99.5 F (37.5 C), temperature source Oral, resp. rate 16, height 6' (1.829 m), weight 194 lb 7.1 oz (88.2 kg), SpO2 97 %.  Vital signs are normal.  No fever.   Output: Producing urine and producing stool.   Abdomen: Abdomen is soft and non-distended.  There is incisional tenderness.  (Midline wound healing well.  ).   Assessment:  Condition: In stable condition.  Unchanged.   (Still with min PO intake.  Patient states that he does not like the food and requesting a "pot pie".  ).   Plan:  Advance diet as tolerated.  (Will attempt to give the patient outside food to see if he eats.  If unsuccessful, recommend CT a/p as he is not a patient that complains and has a high threshold for pain and there could be some underlying pathology to his anorexia.  Given his path, he needs a CT chest, a/p despite node negative disease).    Andres Labrum 05/09/2017

## 2017-05-10 MED ORDER — DEXTROSE-NACL 5-0.45 % IV SOLN
INTRAVENOUS | Status: DC
  Administered 2017-05-10 – 2017-05-13 (×5): via INTRAVENOUS

## 2017-05-10 MED ORDER — BISACODYL 10 MG RE SUPP
10.0000 mg | Freq: Once | RECTAL | Status: AC
  Administered 2017-05-10: 10 mg via RECTAL
  Filled 2017-05-10: qty 1

## 2017-05-10 NOTE — Progress Notes (Signed)
Zachary [name]  MRN: [id number]  DOB/AGE: [birth year] 71 y.o.  Primary Care Physician:Fanta, Tesfaye, MD  Admit date: 05/01/2017  Chief Complaint:  Chief Complaint  Patient presents with  . Abnormal Lab    low hgb    S-Pt presented on  05/01/2017 with  Chief Complaint  Patient presents with  . Abnormal Lab    low hgb  .    Pt offers no new complaints of pain.       Meds . atorvastatin  40 mg Oral QPM  . epoetin (EPOGEN/PROCRIT) injection  2,000 Units Intravenous Q T,Th,Sa-HD  . feeding supplement (PRO-STAT 64)  30 mL Oral BID BM  . levothyroxine  75 mcg Oral QAC breakfast  . metoCLOPramide (REGLAN) injection  10 mg Intravenous Q8H  . multivitamin with minerals  1 tablet Oral QPM  . pantoprazole  80 mg Oral Daily  . polyethylene glycol  17 g Oral BID  . tamsulosin  0.4 mg Oral Q2000  . torsemide  50 mg Oral Daily     Physical Exam: Vital signs in last 24 hours: Temp:  [98.6 F (37 C)-99.8 F (37.7 C)] 99.4 F (37.4 C) (07/15 1347) Pulse Rate:  [102-125] 125 (07/15 1347) Resp:  [17-18] 17 (07/15 1347) BP: (104-129)/(54-80) 109/80 (07/15 1347) SpO2:  [98 %-100 %] 98 % (07/15 1347) Weight:  [192 lb 7.4 oz (87.3 kg)-194 lb 7.1 oz (88.2 kg)] 192 lb 7.4 oz (87.3 kg) (07/15 0541) Weight change:  Last BM Date: 05/08/17  Intake/Output from previous day: 07/14 0701 - 07/15 0700 In: 4485 [P.O.:60; I.V.:4425] Out: 1910 [Urine:410] Total I/O In: 411.3 [I.V.:411.3] Out: 325 [Urine:325]   Physical Exam: General- pt is awake, follow commands Resp- No acute REsp distress, NO Rhonchi CVS- S1S2 regular in rate and rhythm GIT- BS+, staples in situ,Distended EXT- NO LE Edema, NO Cyanosis Access- AVF +  Lab Results: CBC  Recent Labs  05/09/17 0943  WBC 8.5  HGB 8.7*  HCT 26.8*  PLT 246    BMET  Recent Labs  05/08/17 1038 05/09/17 0943  NA 142 144  K 3.8 4.0  CL 108 108  CO2 23 24  GLUCOSE 75 128*  BUN 41* 61*  CREATININE 4.12* 5.45*  CALCIUM 7.4*  8.3*     MICRO Recent Results (from the past 240 hour(s))  MRSA PCR Screening     Status: Abnormal   Collection Time: 05/01/17 11:22 AM  Result Value Ref Range Status   MRSA by PCR POSITIVE (A) NEGATIVE Final    Comment:        The GeneXpert MRSA Assay (FDA approved for NASAL specimens only), is one component of a comprehensive MRSA colonization surveillance program. It is not intended to diagnose MRSA infection nor to guide or monitor treatment for MRSA infections. RESULT CALLED TO, READ BACK BY AND VERIFIED WITH: HYLTON L. AT 1406 ON [id number] BY THOMPSON S.       Lab Results  Component Value Date   PTH 61 10/09/2015   CALCIUM 8.3 (L) 05/09/2017   CAION 1.24 10/08/2015   PHOS 5.9 (H) 05/09/2017                Impression: 1)Renal  ESRD on HD                Pt is on TTS schedule as outpt                Pt was dialyzed yesterday  2)HTN  BP stable  3)Anemia IN ESRD the goal for HGB is 9-11. hgb not at goal on EPO during hd.   4)CKD Mineral-Bone Disorder Calcium when corrected for low albumin is at goal Phosphorus not  at goal.   On lower side sec to poor po intake    Not on any binders sec to NPO   5)CNS-Hx of mental Retardation  PMD following  6)Electrolytes  Hyperkalemic   Hypokalemic labs were intra dialytic   Now better Normonatremia    7)Acid base Co2  at goal.  8) GI-pt is s/p total colectomy Now with ileus Sx following   Plan:  Will continue current tx. No need of HD today.     Shekinah Pitones S 05/10/2017, 3:17 PM

## 2017-05-10 NOTE — Progress Notes (Signed)
Zachary [name] is a 71 y.o. male patient. 1. Lower GI bleed   2. Colonic mass   3. Colonoscopy causing post-procedural bleeding   4. DVT of leg (deep venous thrombosis) (HCC)   5. Post procedure discomfort   6. Deep vein thrombosis (DVT) of right lower extremity, unspecified chronicity, unspecified vein (HCC)    Past Medical History:  Diagnosis Date  . Abnormal CT scan, kidney 10/06/2011  . Acute pyelonephritis 10/07/2011  . Anemia    normocytic  . Anxiety    mental retardation  . Bladder wall thickening 10/06/2011  . BPH (benign prostatic hypertrophy)   . Diabetes mellitus   . Edema     history of lower extremity edema  . Heme positive stool   . Hydronephrosis   . Hyperkalemia   . Hyperlipidemia   . Hypernatremia   . Hypertension   . Hypothyroidism   . Impaired speech   . 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 MR (mental retardation)   . Obstructive uropathy   . 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 Poor historian poor historian  . Protein calorie malnutrition ([place])   . Pyelonephritis   . Renal failure (ARF), acute on chronic (HCC)   . Renal insufficiency    chronic history  . Smoking   . Uremia   . Urinary retention   . UTI (lower urinary tract infection) 10/06/2011  . UTI (lower urinary tract infection)    Current Facility-Administered Medications  Medication Dose Route Frequency Provider Last Rate Last Dose  . 0.9 %  sodium chloride infusion  100 mL Intravenous PRN Befekadu, Eli Phillips, MD      . 0.9 %  sodium chloride infusion  100 mL Intravenous PRN Befekadu, Belayenh, MD      . 0.9 %  sodium chloride infusion  100 mL Intravenous PRN Befekadu, Belayenh, MD      . 0.9 %  sodium chloride infusion  100 mL Intravenous PRN Fran Lowes, MD      . acetaminophen (TYLENOL) tablet 650 mg  650 mg Oral Q6H PRN Tat, David, MD       Or  . acetaminophen (TYLENOL) suppository 650 mg  650 mg Rectal Q6H PRN Tat, Shanon Brow, MD      . atorvastatin (LIPITOR) tablet 40 mg  40  mg Oral QPM Orson Eva, MD   40 mg at 05/09/17 1655  . dextrose 5 %-0.45 % sodium chloride infusion   Intravenous Continuous Fanta, Tesfaye, MD      . epoetin alfa (EPOGEN,PROCRIT) injection 2,000 Units  2,000 Units Intravenous Q T,Th,Sa-HD Liana Gerold, MD   2,000 Units at 05/09/17 1735  . feeding supplement (PRO-STAT 64) liquid 30 mL  30 mL Oral BID BM Orson Eva, MD   30 mL at 05/09/17 0826  . fentaNYL (SUBLIMAZE) injection 50 mcg  50 mcg Intravenous Q2H PRN Aviva Signs, MD   50 mcg at 05/09/17 1656  . levothyroxine (SYNTHROID, LEVOTHROID) tablet 75 mcg  75 mcg Oral QAC breakfast Tat, Shanon Brow, MD   75 mcg at 05/09/17 0825  . lidocaine (PF) (XYLOCAINE) 1 % injection 5 mL  5 mL Intradermal PRN Fran Lowes, MD      . lidocaine-prilocaine (EMLA) cream 1 application  1 application Topical PRN Fran Lowes, MD      . metoCLOPramide (REGLAN) injection 10 mg  10 mg Intravenous Q8H Andres Labrum, MD   10 mg at 05/10/17 0641  . multivitamin with minerals tablet 1 tablet  1 tablet  Oral QPM Orson Eva, MD   1 tablet at 05/09/17 1655  . ondansetron (ZOFRAN) tablet 4 mg  4 mg Oral Q6H PRN Orson Eva, MD   4 mg at 05/06/17 2108   Or  . ondansetron (ZOFRAN) injection 4 mg  4 mg Intravenous Q6H PRN Orson Eva, MD   4 mg at 05/09/17 1712  . oxyCODONE (Oxy IR/ROXICODONE) immediate release tablet 5-10 mg  5-10 mg Oral Q4H PRN Aviva Signs, MD   10 mg at 05/09/17 0825  . pantoprazole (PROTONIX) EC tablet 80 mg  80 mg Oral Daily Tat, Shanon Brow, MD   80 mg at 05/09/17 0825  . pentafluoroprop-tetrafluoroeth (GEBAUERS) aerosol 1 application  1 application Topical PRN Fran Lowes, MD      . polyethylene glycol (MIRALAX / GLYCOLAX) packet 17 g  17 g Oral BID Andres Labrum, MD   17 g at 05/09/17 2102  . simethicone (MYLICON) chewable tablet 40 mg  40 mg Oral Q6H PRN Aviva Signs, MD      . tamsulosin [facility]) capsule 0.4 mg  0.4 mg Oral Q2000 Orson Eva, MD   0.4 mg at 05/09/17 2053  . torsemide  (DEMADEX) tablet 50 mg  50 mg Oral Daily Tat, David, MD   50 mg at 05/09/17 0825   No Known Allergies Active Problems:   Mental retardation   Hyperlipidemia   Acute blood loss anemia   ESRD (end stage renal disease) (HCC)   Anemia in CKD (chronic kidney disease)   Colonic mass   Lower GI bleed   GI bleed   Dysplastic polyp of colon  Blood pressure 109/69, pulse (!) 102, temperature 99.8 F (37.7 C), temperature source Oral, resp. rate 18, height 6' (1.829 m), weight 192 lb 7.4 oz (87.3 kg), SpO2 100 %.  Subjective: Symptoms:  (CT a/p yesterday reveals ileus.  Per my review, a moderate sized stool burden below the anastomosis is noted.  Two episodes of emesis yesterday but occurred after receiving IV pain medication.  No complaints today.  Patient states he had a bowel movement this am but not per the RN.  ).   Diet:  NPO.   Activity level: Impaired due to weakness.    Objective: General Appearance:  Comfortable.   Vital signs: (most recent): Blood pressure 109/69, pulse (!) 102, temperature 99.8 F (37.7 C), temperature source Oral, resp. rate 18, height 6' (1.829 m), weight 192 lb 7.4 oz (87.3 kg), SpO2 100 %.  No fever.   Output: No stool output.   Abdomen: Abdomen is soft and distended.  There is incisional tenderness.    Assessment:  Condition: In stable condition.   (S/p subtotal colectomy with ileus).   Plan:  Encourage ambulation.  NPO.  (Continue reglan and bowel rest.  Will give a suppository today to stimulate the colon below the anastomosis.  Await return of bowel function).    Andres Labrum 05/10/2017

## 2017-05-10 NOTE — Progress Notes (Signed)
Patient repeatedly stating that he did not want to talk when asked questions for med administration and assessment. Patient did answer some questions but not all. Patient encouraged to try to urinate and educated about importance of assessing whether or not he can urinate. Patient repeatedly states "in a little bit" when asked to try to urinate. Urinal remains in place per patient request. RN has returned to patient several times to encourage patient to urinate. The answer remains the same. Will continue to monitor.

## 2017-05-10 NOTE — Progress Notes (Signed)
Subjective: Patient is NPO due to ileus. He had CT scan yesterday. He vomited twice yesterday but not today. Patient is a poor communicator due to mental challenge.  Objective: Vital signs in last 24 hours: Temp:  [98.6 F (37 C)-99.8 F (37.7 C)] 99.8 F (37.7 C) (07/15 0541) Pulse Rate:  [102-121] 102 (07/15 0541) Resp:  [18] 18 (07/15 0541) BP: (104-129)/(54-78) 109/69 (07/15 0541) SpO2:  [98 %-100 %] 100 % (07/15 0541) Weight:  [87.3 kg (192 lb 7.4 oz)-88.2 kg (194 lb 7.1 oz)] 87.3 kg (192 lb 7.4 oz) (07/15 0541) Weight change:  Last BM Date: 05/08/17  Intake/Output from previous day: 07/14 0701 - 07/15 0700 In: 4485 [P.O.:60; I.V.:4425] Out: 1910 [Urine:410]  PHYSICAL EXAM General appearance: alert and no distress Resp: clear to auscultation bilaterally Cardio: S1, S2 normal GI: soft, non-tender; bowel sounds normal; no masses,  no organomegaly and midline surgical incision dressed Extremities: extremities normal, atraumatic, no cyanosis or edema  Lab Results:  Results for orders placed or performed during the hospital encounter of 05/01/17 (from the past 48 hour(s))  Basic metabolic panel     Status: Abnormal   Collection Time: 05/08/17 10:38 AM  Result Value Ref Range   Sodium 142 135 - 145 mmol/L   Potassium 3.8 3.5 - 5.1 mmol/L    Comment: DELTA CHECK NOTED   Chloride 108 101 - 111 mmol/L   CO2 23 22 - 32 mmol/L   Glucose, Bld 75 65 - 99 mg/dL   BUN 41 (H) 6 - 20 mg/dL   Creatinine, Ser 4.12 (H) 0.61 - 1.24 mg/dL    Comment: DELTA CHECK NOTED   Calcium 7.4 (L) 8.9 - 10.3 mg/dL   GFR calc non Af Amer 14 (L) >60 mL/min   GFR calc Af Amer 16 (L) >60 mL/min    Comment: (NOTE) The eGFR has been calculated using the CKD EPI equation. This calculation has not been validated in all clinical situations. eGFR's persistently <60 mL/min signify possible Chronic Kidney Disease.    Anion gap 11 5 - 15  CBC with Differential/Platelet     Status: Abnormal   Collection  Time: 05/09/17  9:43 AM  Result Value Ref Range   WBC 8.5 4.0 - 10.5 K/uL   RBC 2.74 (L) 4.22 - 5.81 MIL/uL   Hemoglobin 8.7 (L) 13.0 - 17.0 g/dL   HCT 26.8 (L) 39.0 - 52.0 %   MCV 97.8 78.0 - 100.0 fL   MCH 31.8 26.0 - 34.0 pg   MCHC 32.5 30.0 - 36.0 g/dL   RDW 17.1 (H) 11.5 - 15.5 %   Platelets 246 150 - 400 K/uL   Neutrophils Relative % 70 %   Neutro Abs 5.9 1.7 - 7.7 K/uL   Lymphocytes Relative 10 %   Lymphs Abs 0.9 0.7 - 4.0 K/uL   Monocytes Relative 16 %   Monocytes Absolute 1.3 (H) 0.1 - 1.0 K/uL   Eosinophils Relative 4 %   Eosinophils Absolute 0.3 0.0 - 0.7 K/uL   Basophils Relative 0 %   Basophils Absolute 0.0 0.0 - 0.1 K/uL  Renal function panel     Status: Abnormal   Collection Time: 05/09/17  9:43 AM  Result Value Ref Range   Sodium 144 135 - 145 mmol/L   Potassium 4.0 3.5 - 5.1 mmol/L   Chloride 108 101 - 111 mmol/L   CO2 24 22 - 32 mmol/L   Glucose, Bld 128 (H) 65 - 99 mg/dL   BUN   61 (H) 6 - 20 mg/dL   Creatinine, Ser 5.45 (H) 0.61 - 1.24 mg/dL   Calcium 8.3 (L) 8.9 - 10.3 mg/dL   Phosphorus 5.9 (H) 2.5 - 4.6 mg/dL   Albumin 2.3 (L) 3.5 - 5.0 g/dL   GFR calc non Af Amer 10 (L) >60 mL/min   GFR calc Af Amer 11 (L) >60 mL/min    Comment: (NOTE) The eGFR has been calculated using the CKD EPI equation. This calculation has not been validated in all clinical situations. eGFR's persistently <60 mL/min signify possible Chronic Kidney Disease.    Anion gap 12 5 - 15    ABGS No results for input(s): PHART, PO2ART, TCO2, HCO3 in the last 72 hours.  Invalid input(s): PCO2 CULTURES Recent Results (from the past 240 hour(s))  MRSA PCR Screening     Status: Abnormal   Collection Time: 05/01/17 11:22 AM  Result Value Ref Range Status   MRSA by PCR POSITIVE (A) NEGATIVE Final    Comment:        The GeneXpert MRSA Assay (FDA approved for NASAL specimens only), is one component of a comprehensive MRSA colonization surveillance program. It is not intended to  diagnose MRSA infection nor to guide or monitor treatment for MRSA infections. RESULT CALLED TO, READ BACK BY AND VERIFIED WITH: HYLTON L. AT 1406 ON [id number] BY THOMPSON S.    Studies/Results: Ct Abdomen Pelvis Wo Contrast  Result Date: 05/09/2017 CLINICAL DATA:  71 year old male with history of recent colonoscopy and postprocedural bleeding. 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 EXAM: CT ABDOMEN AND PELVIS WITHOUT CONTRAST TECHNIQUE: Multidetector CT imaging of the abdomen and pelvis was performed following the standard protocol without IV contrast. COMPARISON:  CT of the abdomen and pelvis 03/10/2016. FINDINGS: Lower chest: There is aortic atherosclerosis, as well as the coronary arteries, including calcified atherosclerotic plaque in the left main, left anterior descending, left circumflex and right coronary arteries. Dependent opacities in the lower lobes of the lungs bilaterally, favored to predominantly reflect areas of subsegmental atelectasis, although sequela of aspiration is also suspected in the right lower lobe where there is likely some airspace consolidation. Trace bilateral pleural effusions. Hepatobiliary: No discrete cystic or solid hepatic lesions are confidently identified on today's noncontrast CT examination. Intermediate attenuation material lying dependently in the gallbladder likely reflects biliary sludge. No definite evidence of an acute cholecystitis at this time. Pancreas: No definite pancreatic mass or peripancreatic inflammatory changes noted on today's noncontrast CT examination. Spleen: Unremarkable. Adrenals/Urinary Tract: 2 tiny nonobstructive calculi are noted within the renal collecting systems bilaterally, largest of which is in the interpolar region of the left kidney measuring 2 mm. 2.1 cm low-attenuation lesion in the interpolar region of the left kidney is incompletely characterized on today's noncontrast CT examination, but is statistically likely a cyst. Mild  to moderate bilateral hydroureteronephrosis. Urinary bladder is mildly distended. Stomach/Bowel: Stomach is moderately distended. Multiple dilated loops of small bowel measuring up to 4.1 cm in diameter with multiple air-fluid levels. No discrete transition point between dilated and nondilated small bowel is identified. Patient is status post subtotal colectomy with ileal colicky anastomosis in the central anatomic pelvis. There does appear to be some thickening of the distal ileum, which may reflect some back wash ileitis. Vascular/Lymphatic: Aortic atherosclerosis, without definite aneurysm in the abdominal or pelvic vasculature. No lymphadenopathy noted in the abdomen or pelvis. 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 Seminal vesicles are unremarkable  in appearance. Other: Trace volume of ascites. Small amount of pneumoperitoneum. Healing midline anterior abdominal wall wound with overlying skin staples in place. Musculoskeletal: There are no aggressive appearing lytic or blastic lesions noted in the visualized portions of the skeleton. IMPRESSION: 1. Postoperative changes of recent subtotal colectomy with what appears to be a postoperative ileus, and small amount of postoperative Sitz ascites and pneumoperitoneum. 2. Mild moderate bilateral hydroureteronephrosis. Urinary bladder is also moderately distended. This may reflect bladder outlet obstruction from an enlarged prostate gland with median lobe hypertrophy. No other definite source of obstruction is identified. 3. Two tiny nonobstructive calculi are noted within both renal collecting systems measuring 1-2 mm in size. No definite ureteral stones are noted. 4. Biliary sludge in the gallbladder, without evidence to suggest an acute cholecystitis at this time. 5. Aortic atherosclerosis, in addition to left main and 3 vessel coronary artery disease. Please note that although the presence of coronary  artery calcium documents the presence of coronary artery disease, the severity of this disease and any potential stenosis cannot be assessed on this non-gated CT examination. Assessment for potential risk factor modification, dietary therapy or pharmacologic therapy may be warranted, if clinically indicated. 6. Additional incidental findings, as above. Aortic Atherosclerosis (ICD10-I70.0). Electronically Signed   By: Daniel  Entrikin M.D.   On: 05/09/2017 18:07    Medications: I have reviewed the patient's current medications.  Assesment:  Active Problems:   Mental retardation   Hyperlipidemia   Acute blood loss anemia   ESRD (end stage renal disease) (HCC)   Anemia in CKD (chronic kidney disease)   Colonic mass   Lower GI bleed   GI bleed   Dysplastic polyp of colon    Plan:  Medications reviewed Keep NPO Will change iV fluid to D51/2 normal saline Continue maintenance hemodialysis.   As per surgery plan    LOS: 8 days   FANTA,TESFAYE 05/10/2017, 8:40 AM  

## 2017-05-11 MED ORDER — BISACODYL 10 MG RE SUPP
10.0000 mg | Freq: Two times a day (BID) | RECTAL | Status: DC
  Administered 2017-05-11 – 2017-05-14 (×6): 10 mg via RECTAL
  Filled 2017-05-11 (×6): qty 1

## 2017-05-11 NOTE — Progress Notes (Signed)
Subjective: Patient has no complaint. He is NPO. No vomiting report.   Objective: Vital signs in last 24 hours: Temp:  [99.4 F (37.4 C)-101.1 F (38.4 C)] 99.8 F (37.7 C) (07/16 0540) Pulse Rate:  [121-126] 121 (07/16 0540) Resp:  [14-18] 14 (07/16 0540) BP: (104-110)/(59-80) 104/59 (07/16 0540) SpO2:  [94 %-98 %] 94 % (07/16 0540) Weight:  [88.6 kg (195 lb 6.4 oz)] 88.6 kg (195 lb 6.4 oz) (07/16 0540) Weight change: 0.433 kg (15.3 oz) Last BM Date: 05/08/17  Intake/Output from previous day: 07/15 0701 - 07/16 0700 In: 1371.3 [P.O.:60; I.V.:1311.3] Out: 325 [Urine:325]  PHYSICAL EXAM General appearance: alert and no distress Resp: clear to auscultation bilaterally Cardio: S1, S2 normal GI: distended, bowel sound is hypoactive, tenderness of the incision site Extremities: extremities normal, atraumatic, no cyanosis or edema  Lab Results:  Results for orders placed or performed during the hospital encounter of 05/01/17 (from the past 48 hour(s))  CBC with Differential/Platelet     Status: Abnormal   Collection Time: 05/09/17  9:43 AM  Result Value Ref Range   WBC 8.5 4.0 - 10.5 K/uL   RBC 2.74 (L) 4.22 - 5.81 MIL/uL   Hemoglobin 8.7 (L) 13.0 - 17.0 g/dL   HCT 26.8 (L) 39.0 - 52.0 %   MCV 97.8 78.0 - 100.0 fL   MCH 31.8 26.0 - 34.0 pg   MCHC 32.5 30.0 - 36.0 g/dL   RDW 17.1 (H) 11.5 - 15.5 %   Platelets 246 150 - 400 K/uL   Neutrophils Relative % 70 %   Neutro Abs 5.9 1.7 - 7.7 K/uL   Lymphocytes Relative 10 %   Lymphs Abs 0.9 0.7 - 4.0 K/uL   Monocytes Relative 16 %   Monocytes Absolute 1.3 (H) 0.1 - 1.0 K/uL   Eosinophils Relative 4 %   Eosinophils Absolute 0.3 0.0 - 0.7 K/uL   Basophils Relative 0 %   Basophils Absolute 0.0 0.0 - 0.1 K/uL  Renal function panel     Status: Abnormal   Collection Time: 05/09/17  9:43 AM  Result Value Ref Range   Sodium 144 135 - 145 mmol/L   Potassium 4.0 3.5 - 5.1 mmol/L   Chloride 108 101 - 111 mmol/L   CO2 24 22 - 32  mmol/L   Glucose, Bld 128 (H) 65 - 99 mg/dL   BUN 61 (H) 6 - 20 mg/dL   Creatinine, Ser 5.45 (H) 0.61 - 1.24 mg/dL   Calcium 8.3 (L) 8.9 - 10.3 mg/dL   Phosphorus 5.9 (H) 2.5 - 4.6 mg/dL   Albumin 2.3 (L) 3.5 - 5.0 g/dL   GFR calc non Af Amer 10 (L) >60 mL/min   GFR calc Af Amer 11 (L) >60 mL/min    Comment: (NOTE) The eGFR has been calculated using the CKD EPI equation. This calculation has not been validated in all clinical situations. eGFR's persistently <60 mL/min signify possible Chronic Kidney Disease.    Anion gap 12 5 - 15    ABGS No results for input(s): PHART, PO2ART, TCO2, HCO3 in the last 72 hours.  Invalid input(s): PCO2 CULTURES Recent Results (from the past 240 hour(s))  MRSA PCR Screening     Status: Abnormal   Collection Time: 05/01/17 11:22 AM  Result Value Ref Range Status   MRSA by PCR POSITIVE (A) NEGATIVE Final    Comment:        The GeneXpert MRSA Assay (FDA approved for NASAL specimens only), is one component  of a comprehensive MRSA colonization surveillance program. It is not intended to diagnose MRSA infection nor to guide or monitor treatment for MRSA infections. RESULT CALLED TO, READ BACK BY AND VERIFIED WITH: HYLTON L. AT 1406 ON [id number] BY THOMPSON S.    Studies/Results: Ct Abdomen Pelvis Wo Contrast  Result Date: 05/09/2017 CLINICAL DATA:  71 year old male with history of recent colonoscopy and postprocedural bleeding. 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 EXAM: CT ABDOMEN AND PELVIS WITHOUT CONTRAST TECHNIQUE: Multidetector CT imaging of the abdomen and pelvis was performed following the standard protocol without IV contrast. COMPARISON:  CT of the abdomen and pelvis 03/10/2016. FINDINGS: Lower chest: There is aortic atherosclerosis, as well as the coronary arteries, including calcified atherosclerotic plaque in the left main, left anterior descending, left circumflex and right coronary arteries. Dependent opacities in the lower lobes  of the lungs bilaterally, favored to predominantly reflect areas of subsegmental atelectasis, although sequela of aspiration is also suspected in the right lower lobe where there is likely some airspace consolidation. Trace bilateral pleural effusions. Hepatobiliary: No discrete cystic or solid hepatic lesions are confidently identified on today's noncontrast CT examination. Intermediate attenuation material lying dependently in the gallbladder likely reflects biliary sludge. No definite evidence of an acute cholecystitis at this time. Pancreas: No definite pancreatic mass or peripancreatic inflammatory changes noted on today's noncontrast CT examination. Spleen: Unremarkable. Adrenals/Urinary Tract: 2 tiny nonobstructive calculi are noted within the renal collecting systems bilaterally, largest of which is in the interpolar region of the left kidney measuring 2 mm. 2.1 cm low-attenuation lesion in the interpolar region of the left kidney is incompletely characterized on today's noncontrast CT examination, but is statistically likely a cyst. Mild to moderate bilateral hydroureteronephrosis. Urinary bladder is mildly distended. Stomach/Bowel: Stomach is moderately distended. Multiple dilated loops of small bowel measuring up to 4.1 cm in diameter with multiple air-fluid levels. No discrete transition point between dilated and nondilated small bowel is identified. Patient is status post subtotal colectomy with ileal colicky anastomosis in the central anatomic pelvis. There does appear to be some thickening of the distal ileum, which may reflect some back wash ileitis. Vascular/Lymphatic: Aortic atherosclerosis, without definite aneurysm in the abdominal or pelvic vasculature. No lymphadenopathy noted in the abdomen or pelvis. 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 Seminal vesicles are unremarkable in appearance. Other: Trace volume of ascites. Small amount  of pneumoperitoneum. Healing midline anterior abdominal wall wound with overlying skin staples in place. Musculoskeletal: There are no aggressive appearing lytic or blastic lesions noted in the visualized portions of the skeleton. IMPRESSION: 1. Postoperative changes of recent subtotal colectomy with what appears to be a postoperative ileus, and small amount of postoperative Sitz ascites and pneumoperitoneum. 2. Mild moderate bilateral hydroureteronephrosis. Urinary bladder is also moderately distended. This may reflect bladder outlet obstruction from an enlarged prostate gland with median lobe hypertrophy. No other definite source of obstruction is identified. 3. Two tiny nonobstructive calculi are noted within both renal collecting systems measuring 1-2 mm in size. No definite ureteral stones are noted. 4. Biliary sludge in the gallbladder, without evidence to suggest an acute cholecystitis at this time. 5. Aortic atherosclerosis, in addition to left main and 3 vessel coronary artery disease. Please note that although the presence of coronary artery calcium documents the presence of coronary artery disease, the severity of this disease and any potential stenosis cannot be assessed on this non-gated CT examination. Assessment for potential risk factor  modification, dietary therapy or pharmacologic therapy may be warranted, if clinically indicated. 6. Additional incidental findings, as above. Aortic Atherosclerosis (ICD10-I70.0). Electronically Signed   By: Vinnie Langton M.D.   On: 05/09/2017 18:07    Medications: I have reviewed the patient's current medications.  Assesment:  Active Problems:   Mental retardation   Hyperlipidemia   Acute blood loss anemia   ESRD (end stage renal disease) (HCC)   Anemia in CKD (chronic kidney disease)   Colonic mass   Lower GI bleed   GI bleed   Dysplastic polyp of colon    Plan:  Medications reviewed Keep NPO Continue maintenance  IV fluid  Continue   hemodialysis.   As per surgery plan    LOS: 9 days   Emon Miggins 05/11/2017, 8:13 AM

## 2017-05-11 NOTE — Progress Notes (Signed)
7 Days Post-Op  Subjective: Patient still having episodes of emesis. Mild incisional pain.  Objective: Vital signs in last 24 hours: Temp:  [99.4 F (37.4 C)-101.1 F (38.4 C)] 99.8 F (37.7 C) (07/16 0540) Pulse Rate:  [121-126] 121 (07/16 0540) Resp:  [14-18] 14 (07/16 0540) BP: (104-110)/(59-80) 104/59 (07/16 0540) SpO2:  [94 %-98 %] 94 % (07/16 0540) Weight:  [195 lb 6.4 oz (88.6 kg)] 195 lb 6.4 oz (88.6 kg) (07/16 0540) Last BM Date: 05/08/17  Intake/Output from previous day: 07/15 0701 - 07/16 0700 In: 1371.3 [P.O.:60; I.V.:1311.3] Out: 325 [Urine:325] Intake/Output this shift: No intake/output data recorded.  General appearance: alert, cooperative and no distress GI: Soft but mildly distended. Minimal bowel sounds appreciated. Incision healing well.  Lab Results:   Recent Labs  05/09/17 0943  WBC 8.5  HGB 8.7*  HCT 26.8*  PLT 246   BMET  Recent Labs  05/09/17 0943  NA 144  K 4.0  CL 108  CO2 24  GLUCOSE 128*  BUN 61*  CREATININE 5.45*  CALCIUM 8.3*   PT/INR No results for input(s): LABPROT, INR in the last 72 hours.  Studies/Results: Ct Abdomen Pelvis Wo Contrast  Result Date: 05/09/2017 CLINICAL DATA:  71 year old male with history of recent colonoscopy and postprocedural bleeding. 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 EXAM: CT ABDOMEN AND PELVIS WITHOUT CONTRAST TECHNIQUE: Multidetector CT imaging of the abdomen and pelvis was performed following the standard protocol without IV contrast. COMPARISON:  CT of the abdomen and pelvis 03/10/2016. FINDINGS: Lower chest: There is aortic atherosclerosis, as well as the coronary arteries, including calcified atherosclerotic plaque in the left main, left anterior descending, left circumflex and right coronary arteries. Dependent opacities in the lower lobes of the lungs bilaterally, favored to predominantly reflect areas of subsegmental atelectasis, although sequela of aspiration is also suspected in the  right lower lobe where there is likely some airspace consolidation. Trace bilateral pleural effusions. Hepatobiliary: No discrete cystic or solid hepatic lesions are confidently identified on today's noncontrast CT examination. Intermediate attenuation material lying dependently in the gallbladder likely reflects biliary sludge. No definite evidence of an acute cholecystitis at this time. Pancreas: No definite pancreatic mass or peripancreatic inflammatory changes noted on today's noncontrast CT examination. Spleen: Unremarkable. Adrenals/Urinary Tract: 2 tiny nonobstructive calculi are noted within the renal collecting systems bilaterally, largest of which is in the interpolar region of the left kidney measuring 2 mm. 2.1 cm low-attenuation lesion in the interpolar region of the left kidney is incompletely characterized on today's noncontrast CT examination, but is statistically likely a cyst. Mild to moderate bilateral hydroureteronephrosis. Urinary bladder is mildly distended. Stomach/Bowel: Stomach is moderately distended. Multiple dilated loops of small bowel measuring up to 4.1 cm in diameter with multiple air-fluid levels. No discrete transition point between dilated and nondilated small bowel is identified. Patient is status post subtotal colectomy with ileal colicky anastomosis in the central anatomic pelvis. There does appear to be some thickening of the distal ileum, which may reflect some back wash ileitis. Vascular/Lymphatic: Aortic atherosclerosis, without definite aneurysm in the abdominal or pelvic vasculature. No lymphadenopathy noted in the abdomen or pelvis. 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 Seminal vesicles are unremarkable in appearance. Other: Trace volume of ascites. Small amount of pneumoperitoneum. Healing midline anterior abdominal wall wound with overlying skin staples in place. Musculoskeletal: There are no aggressive  appearing lytic or blastic lesions noted in the visualized portions of  the skeleton. IMPRESSION: 1. Postoperative changes of recent subtotal colectomy with what appears to be a postoperative ileus, and small amount of postoperative Sitz ascites and pneumoperitoneum. 2. Mild moderate bilateral hydroureteronephrosis. Urinary bladder is also moderately distended. This may reflect bladder outlet obstruction from an enlarged prostate gland with median lobe hypertrophy. No other definite source of obstruction is identified. 3. Two tiny nonobstructive calculi are noted within both renal collecting systems measuring 1-2 mm in size. No definite ureteral stones are noted. 4. Biliary sludge in the gallbladder, without evidence to suggest an acute cholecystitis at this time. 5. Aortic atherosclerosis, in addition to left main and 3 vessel coronary artery disease. Please note that although the presence of coronary artery calcium documents the presence of coronary artery disease, the severity of this disease and any potential stenosis cannot be assessed on this non-gated CT examination. Assessment for potential risk factor modification, dietary therapy or pharmacologic therapy may be warranted, if clinically indicated. 6. Additional incidental findings, as above. Aortic Atherosclerosis (ICD10-I70.0). Electronically Signed   By: Vinnie Langton M.D.   On: 05/09/2017 18:07    Anti-infectives: Anti-infectives    Start     Dose/Rate Route Frequency Ordered Stop   05/04/17 0935  ertapenem (INVANZ) 1 g in sodium chloride 0.9 % 50 mL IVPB     1 g 100 mL/hr over 30 Minutes Intravenous On call to O.R. 05/04/17 0935 05/04/17 1014      Assessment/Plan: s/p Procedure(s): TOTAL COLECTOMY Impression: Postoperative day 7, status post total colectomy. Postoperative ileus still present. We'll stop pro-stat as patient still having episodes of emesis. Will start Dulcolax suppositories. Encourage ambulation.  LOS: 9 days    Aviva Signs 05/11/2017

## 2017-05-11 NOTE — Progress Notes (Signed)
Subjective: Interval History: Patient offers no complaints. Nausea is better and no vomiting.  Objective: Vital signs in last 24 hours: Temp:  [99.4 F (37.4 C)-101.1 F (38.4 C)] 99.8 F (37.7 C) (07/16 0540) Pulse Rate:  [121-126] 121 (07/16 0540) Resp:  [14-18] 14 (07/16 0540) BP: (104-110)/(59-80) 104/59 (07/16 0540) SpO2:  [94 %-98 %] 94 % (07/16 0540) Weight:  [88.6 kg (195 lb 6.4 oz)] 88.6 kg (195 lb 6.4 oz) (07/16 0540) Weight change: 0.433 kg (15.3 oz)  Intake/Output from previous day: 07/15 0701 - 07/16 0700 In: 1371.3 [P.O.:60; I.V.:1311.3] Out: 325 [Urine:325] Intake/Output this shift: No intake/output data recorded.  Generally patient is alert tender no apparent distress Chest is clear to auscultation Heart exam regular rate and rhythm no murmur Abdomen: Incision is clean and healing. Extremities no edema  Lab Results:  Recent Labs  05/09/17 0943  WBC 8.5  HGB 8.7*  HCT 26.8*  PLT 246   BMET:   Recent Labs  05/08/17 1038 05/09/17 0943  NA 142 144  K 3.8 4.0  CL 108 108  CO2 23 24  GLUCOSE 75 128*  BUN 41* 61*  CREATININE 4.12* 5.45*  CALCIUM 7.4* 8.3*   No results for input(s): PTH in the last 72 hours. Iron Studies:  No results for input(s): IRON, TIBC, TRANSFERRIN, FERRITIN in the last 72 hours.  Studies/Results: Ct Abdomen Pelvis Wo Contrast  Result Date: 05/09/2017 CLINICAL DATA:  71 year old male with history of recent colonoscopy and postprocedural bleeding. 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 EXAM: CT ABDOMEN AND PELVIS WITHOUT CONTRAST TECHNIQUE: Multidetector CT imaging of the abdomen and pelvis was performed following the standard protocol without IV contrast. COMPARISON:  CT of the abdomen and pelvis 03/10/2016. FINDINGS: Lower chest: There is aortic atherosclerosis, as well as the coronary arteries, including calcified atherosclerotic plaque in the left main, left anterior descending, left circumflex and right coronary  arteries. Dependent opacities in the lower lobes of the lungs bilaterally, favored to predominantly reflect areas of subsegmental atelectasis, although sequela of aspiration is also suspected in the right lower lobe where there is likely some airspace consolidation. Trace bilateral pleural effusions. Hepatobiliary: No discrete cystic or solid hepatic lesions are confidently identified on today's noncontrast CT examination. Intermediate attenuation material lying dependently in the gallbladder likely reflects biliary sludge. No definite evidence of an acute cholecystitis at this time. Pancreas: No definite pancreatic mass or peripancreatic inflammatory changes noted on today's noncontrast CT examination. Spleen: Unremarkable. Adrenals/Urinary Tract: 2 tiny nonobstructive calculi are noted within the renal collecting systems bilaterally, largest of which is in the interpolar region of the left kidney measuring 2 mm. 2.1 cm low-attenuation lesion in the interpolar region of the left kidney is incompletely characterized on today's noncontrast CT examination, but is statistically likely a cyst. Mild to moderate bilateral hydroureteronephrosis. Urinary bladder is mildly distended. Stomach/Bowel: Stomach is moderately distended. Multiple dilated loops of small bowel measuring up to 4.1 cm in diameter with multiple air-fluid levels. No discrete transition point between dilated and nondilated small bowel is identified. Patient is status post subtotal colectomy with ileal colicky anastomosis in the central anatomic pelvis. There does appear to be some thickening of the distal ileum, which may reflect some back wash ileitis. Vascular/Lymphatic: Aortic atherosclerosis, without definite aneurysm in the abdominal or pelvic vasculature. No lymphadenopathy noted in the abdomen or pelvis. 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 Seminal vesicles are unremarkable in  appearance. Other:  Trace volume of ascites. Small amount of pneumoperitoneum. Healing midline anterior abdominal wall wound with overlying skin staples in place. Musculoskeletal: There are no aggressive appearing lytic or blastic lesions noted in the visualized portions of the skeleton. IMPRESSION: 1. Postoperative changes of recent subtotal colectomy with what appears to be a postoperative ileus, and small amount of postoperative Sitz ascites and pneumoperitoneum. 2. Mild moderate bilateral hydroureteronephrosis. Urinary bladder is also moderately distended. This may reflect bladder outlet obstruction from an enlarged prostate gland with median lobe hypertrophy. No other definite source of obstruction is identified. 3. Two tiny nonobstructive calculi are noted within both renal collecting systems measuring 1-2 mm in size. No definite ureteral stones are noted. 4. Biliary sludge in the gallbladder, without evidence to suggest an acute cholecystitis at this time. 5. Aortic atherosclerosis, in addition to left main and 3 vessel coronary artery disease. Please note that although the presence of coronary artery calcium documents the presence of coronary artery disease, the severity of this disease and any potential stenosis cannot be assessed on this non-gated CT examination. Assessment for potential risk factor modification, dietary therapy or pharmacologic therapy may be warranted, if clinically indicated. 6. Additional incidental findings, as above. Aortic Atherosclerosis (ICD10-I70.0). Electronically Signed   By: Vinnie Langton M.D.   On: 05/09/2017 18:07    I have reviewed the patient's current medications.  Assessment/Plan: Problem #1. Anemia: Secondary to GI bleeding. His hemoglobin is below target goal but stable. Problem #2 end-stage renal disease: He is status post hemodialysis status post hemodialysis on Saturday. Presently is asymptomatic. Problem #3 bone and metabolic disorder: His calcium and  phosphorus is in range. Patient is not on a binder since he still nothing by mouth. Problem #4 hypertension: His blood pressure is reasonably controlled  Problem #5 fluid management: Patient doesn't have any sign and symptom of fluid overload. . Patient is on IV fluid. Problem #6 history of DVT:  Problem #7 history of obstructive uropathy Problem #8  Status post colectomy: Presently asymptomatic and nothing by mouth Plan: 1) patient doesn't require dialysis today. 2] we'll make arrangements for tomorrow which is his regular schedule. 3] we'll check his CBC and renal panel in the morning.   LOS: 9 days   Clem Wisenbaker S 05/11/2017,8:14 AM

## 2017-05-11 NOTE — Progress Notes (Signed)
LCSW continues to follow for disposition:  Return to SNF: [facility]   Patient remains acutely medical and plans remain to return to [facility] once ready. Updated [facility] and sent over clinicals. No other needs at this time.  Will continue to follow and assist with placement.  Lane Hacker, MSW Clinical Social Work: [employer] Coverage for :  [phone]

## 2017-05-12 LAB — RENAL FUNCTION PANEL
Albumin: 2.5 g/dL — ABNORMAL LOW (ref 3.5–5.0)
Anion gap: 13 (ref 5–15)
BUN: 52 mg/dL — ABNORMAL HIGH (ref 6–20)
CO2: 25 mmol/L (ref 22–32)
Calcium: 7.6 mg/dL — ABNORMAL LOW (ref 8.9–10.3)
Chloride: 99 mmol/L — ABNORMAL LOW (ref 101–111)
Creatinine, Ser: 4.73 mg/dL — ABNORMAL HIGH (ref 0.61–1.24)
GFR calc Af Amer: 13 mL/min — ABNORMAL LOW (ref >60)
GFR calc non Af Amer: 12 mL/min — ABNORMAL LOW (ref >60)
Glucose, Bld: 114 mg/dL — ABNORMAL HIGH (ref 65–99)
Phosphorus: 4 mg/dL (ref 2.5–4.6)
Potassium: 3.4 mmol/L — ABNORMAL LOW (ref 3.5–5.1)
Sodium: 137 mmol/L (ref 135–145)

## 2017-05-12 LAB — CBC
HCT: 25.9 % — ABNORMAL LOW (ref 39.0–52.0)
Hemoglobin: 8.6 g/dL — ABNORMAL LOW (ref 13.0–17.0)
MCH: 30.8 pg (ref 26.0–34.0)
MCHC: 33.2 g/dL (ref 30.0–36.0)
MCV: 92.8 fL (ref 78.0–100.0)
Platelets: 287 10*3/uL (ref 150–400)
RBC: 2.79 MIL/uL — ABNORMAL LOW (ref 4.22–5.81)
RDW: 16.7 % — ABNORMAL HIGH (ref 11.5–15.5)
WBC: 4.3 10*3/uL (ref 4.0–10.5)

## 2017-05-12 IMAGING — XA IR CATHETER TUBE CHANGE
1 series · 4 of 4 positions shown · non-contrast
Comparison: none

CLINICAL DATA: Suprapubic catheter upsized for cystoscopy

[Series 1: run · 4 of 4 slices shown]
[im 1/4]
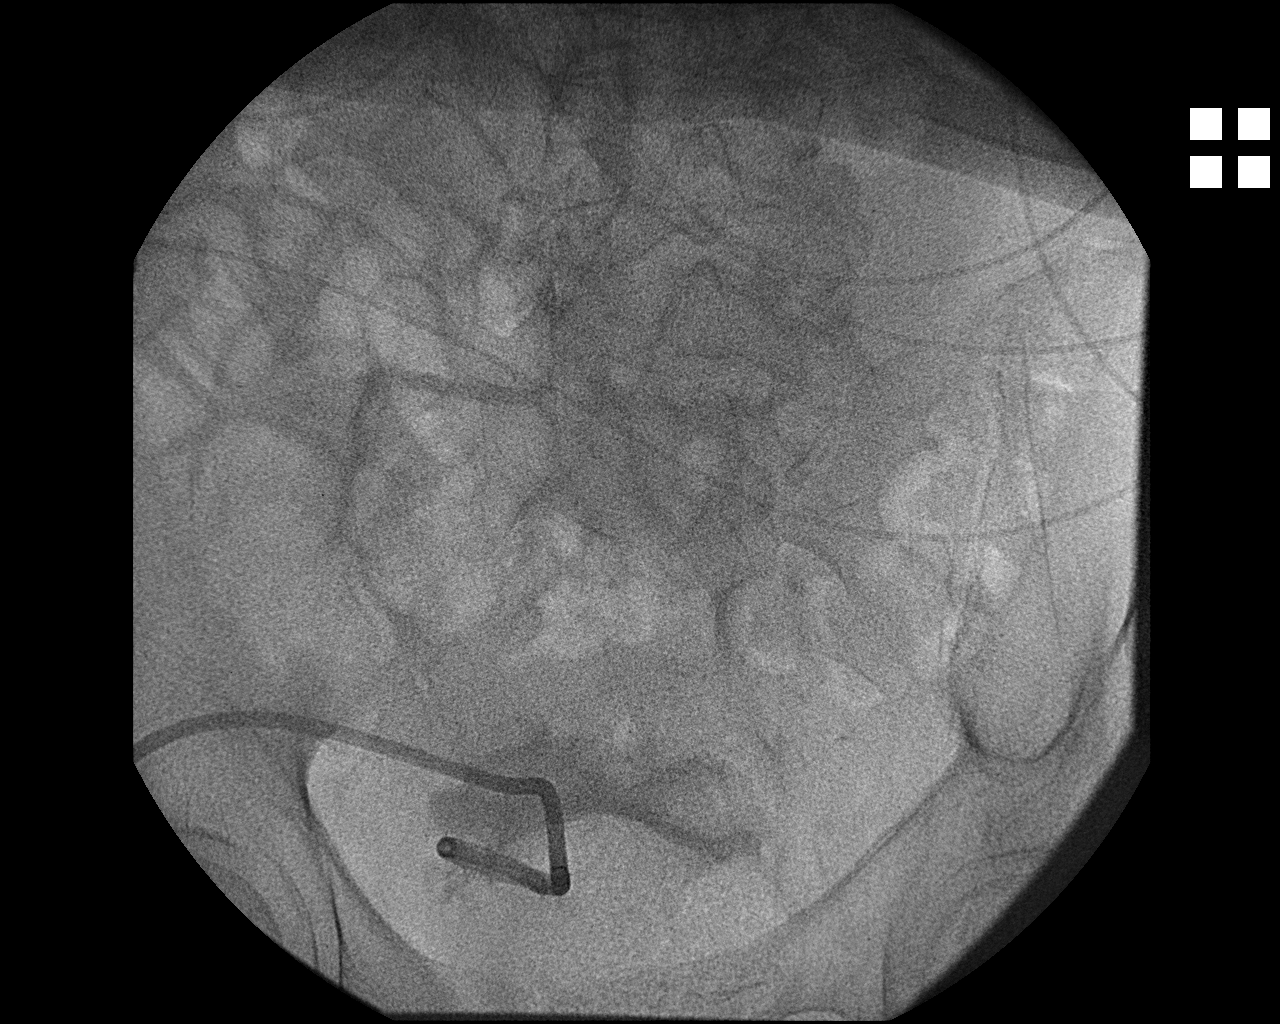
[im 2/4]
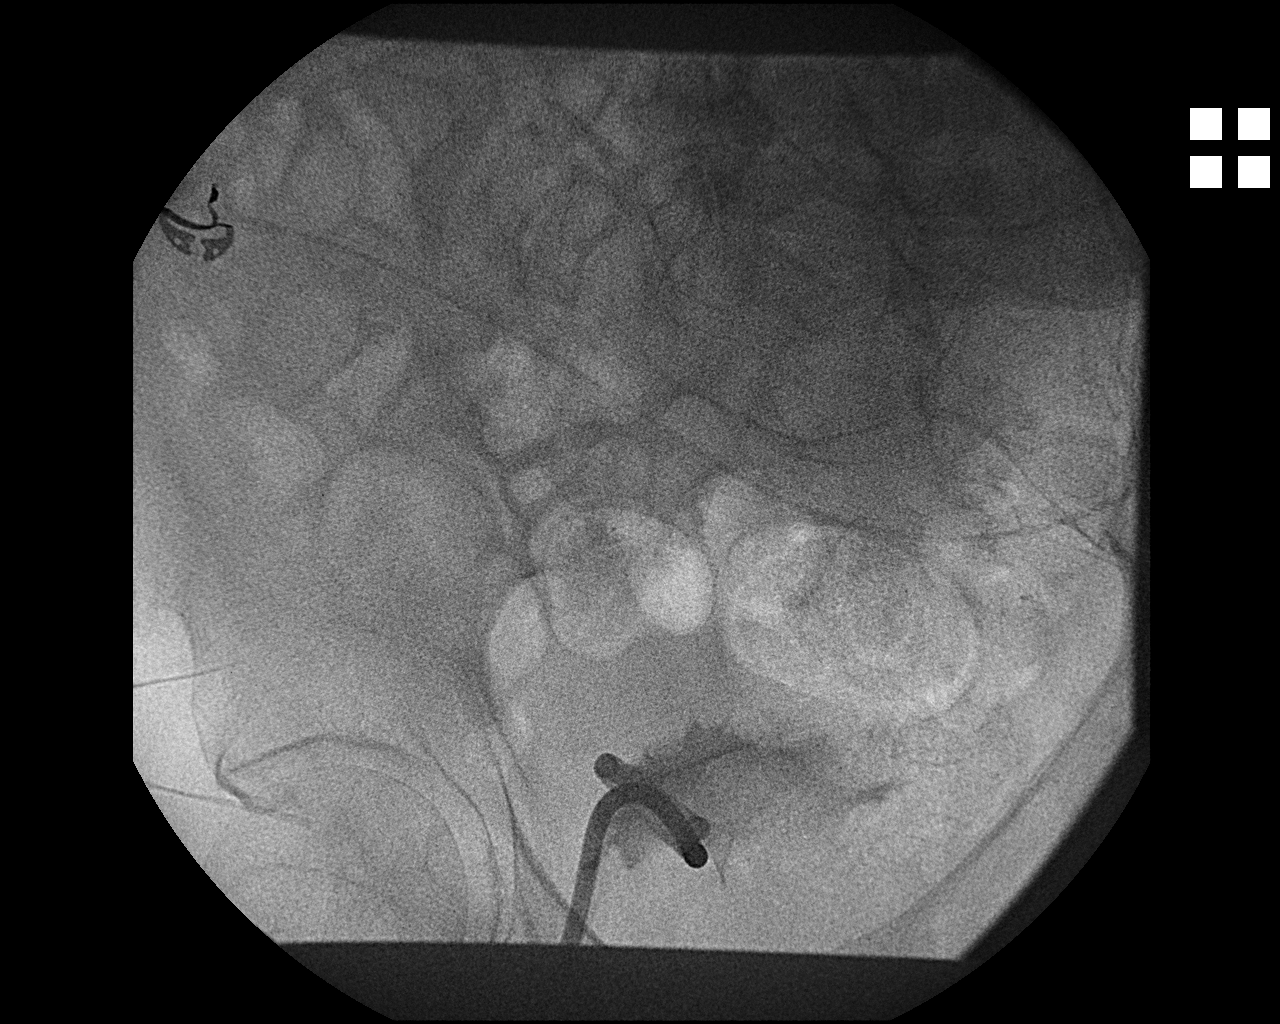
[im 3/4]
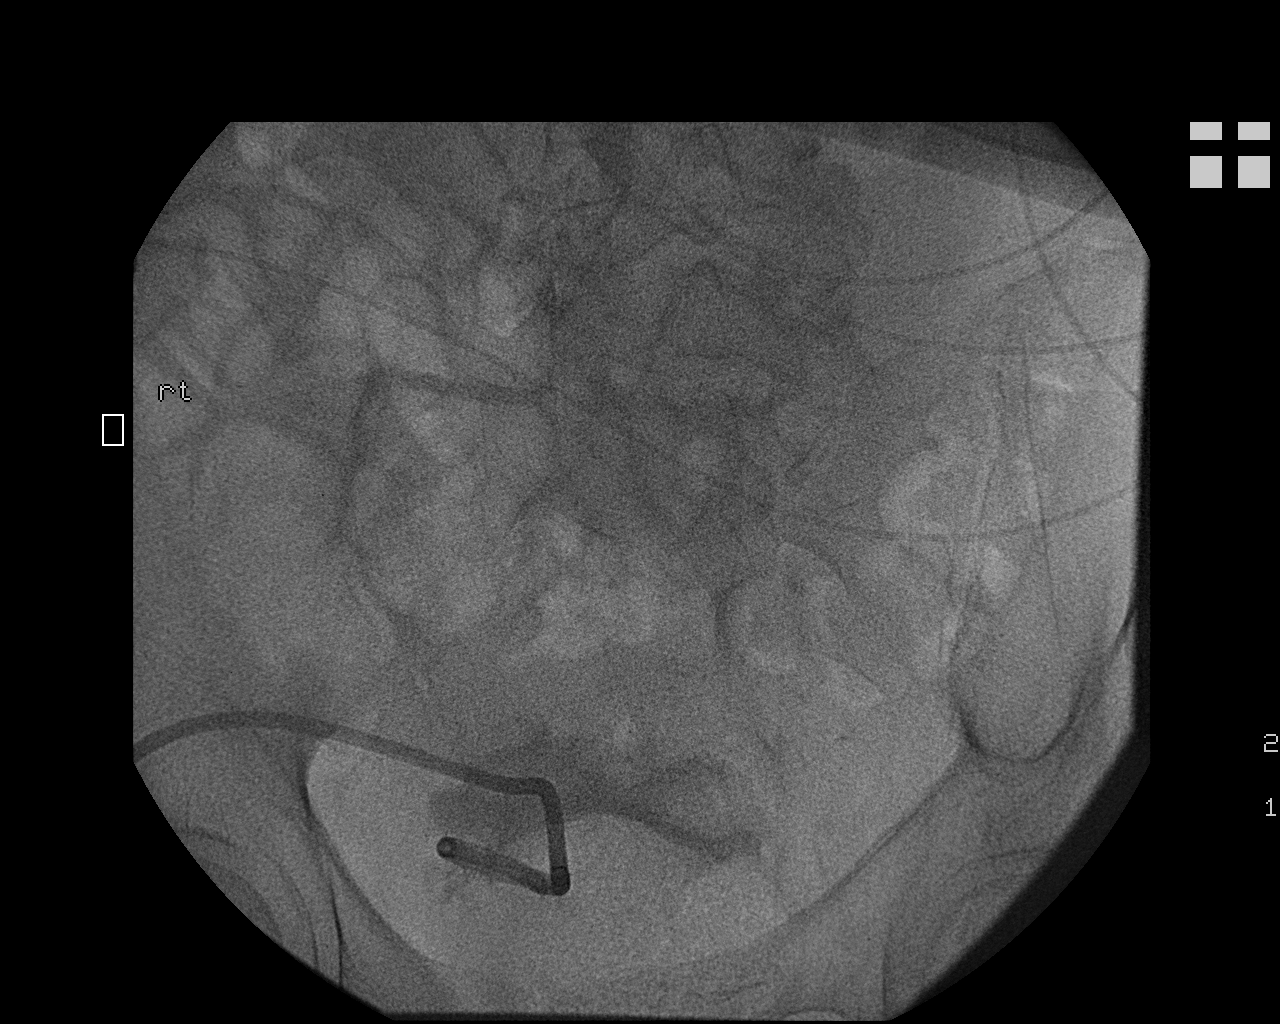
[im 4/4]
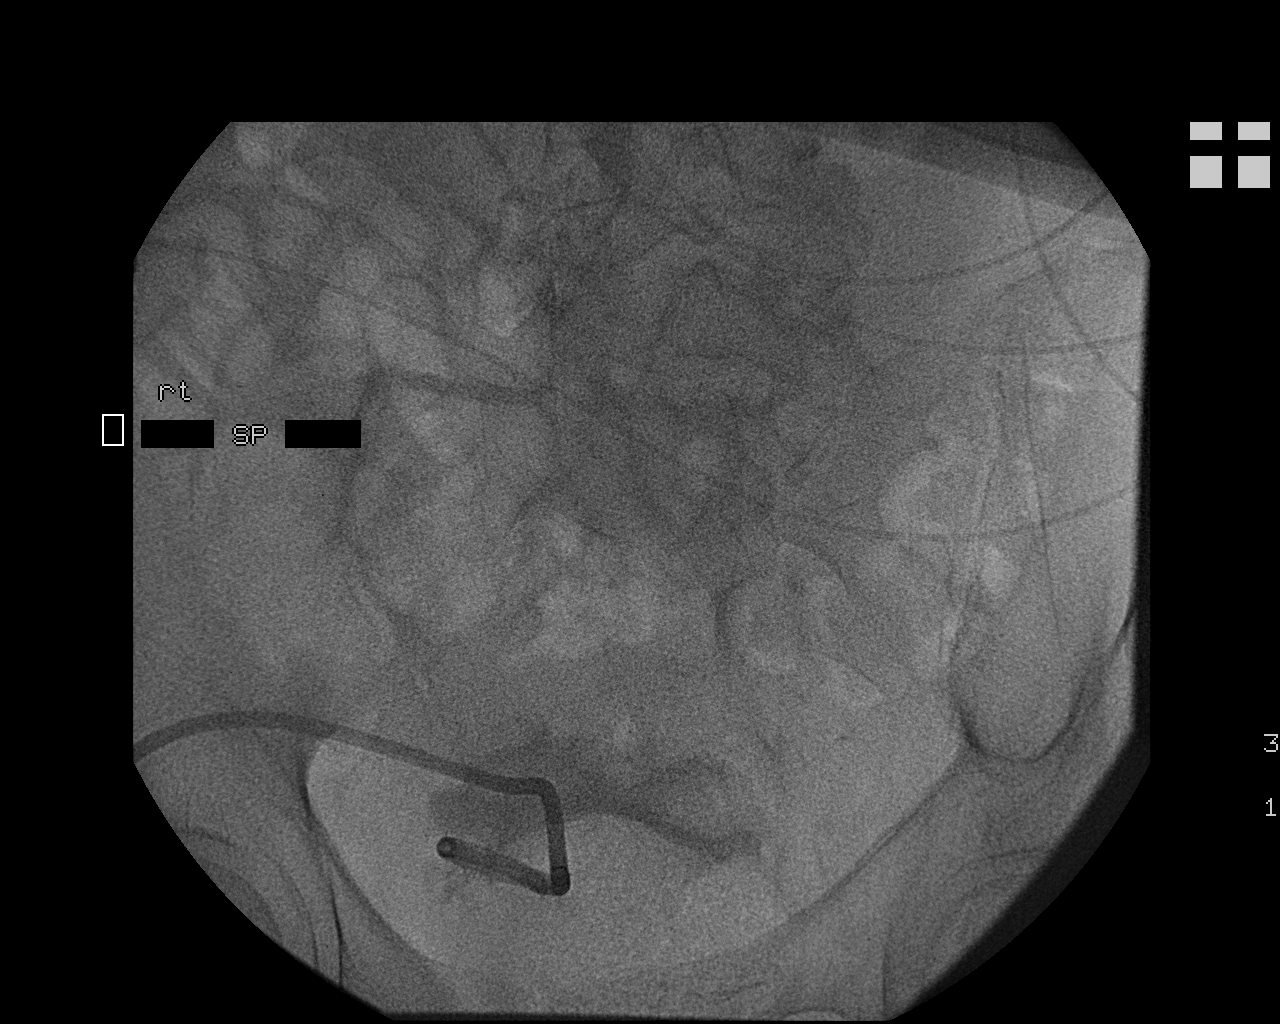

[4 of 4 positions shown; findings below may reference images not displayed]

EXAM:
IR CATHETER TUBE CHANGE

FLUOROSCOPY TIME:  12 seconds

MEDICATIONS AND MEDICAL HISTORY:
None

ANESTHESIA/SEDATION:
None

CONTRAST:  10 cc Omnipaque 300

PROCEDURE:
The procedure, risks, benefits, and alternatives were explained to
the patient. Questions regarding the procedure were encouraged and
answered. The patient understands and consents to the procedure.

The lower abdomen was prepped with Betadine in a sterile fashion,
and a sterile drape was applied covering the operative field. A
sterile gown and sterile gloves were used for the procedure.

The existing 12) she per pubic pigtail catheter was exchanged over
an Amplatz wire for a 14 French pigtail catheter. It was looped and
string fixed in the bladder. Contrast was injected. It was sewn to
the skin within 0 Prolene knot.
FINDINGS: Imaged demonstrates a 14 French suprapubic catheter with its tip in
the bladder.

COMPLICATIONS:
None
IMPRESSION: Successful suprapubic catheter exchange for a 14 French pigtail
catheter.

## 2017-05-12 MED ORDER — SODIUM CHLORIDE 0.9 % IV SOLN: 100.0000 mL | INTRAVENOUS | Status: DC | PRN

## 2017-05-12 MED ORDER — EPOETIN ALFA 10000 UNIT/ML IJ SOLN
10000.0000 [IU] | INTRAMUSCULAR | Status: DC
  Administered 2017-05-12 – 2017-05-14 (×2): 10000 [IU] via INTRAVENOUS
  Filled 2017-05-12 (×3): qty 1

## 2017-05-12 MED ORDER — ALTEPLASE 2 MG IJ SOLR
2.0000 mg | Freq: Once | INTRAMUSCULAR | Status: DC | PRN
  Filled 2017-05-12: qty 2

## 2017-05-12 MED ORDER — EPOETIN ALFA 10000 UNIT/ML IJ SOLN
INTRAMUSCULAR | Status: AC
  Administered 2017-05-12: 10000 [IU] via INTRAVENOUS
  Filled 2017-05-12: qty 1

## 2017-05-12 MED ORDER — HEPARIN SODIUM (PORCINE) 1000 UNIT/ML DIALYSIS
1000.0000 [IU] | INTRAMUSCULAR | Status: DC | PRN
  Filled 2017-05-12: qty 1

## 2017-05-12 NOTE — Care Management Note (Signed)
Case Management Note  Patient Details  Name: EVIE CRUMPLER MRN: [id number] Date of Birth: [birth year]   Status of Service:  In process, will continue to follow  If discussed at Long Length of Stay Meetings, dates discussed:   05/12/2017 Additional Comments:  Consuello Lassalle, Chauncey Reading, RN 05/12/2017, 10:31 AM

## 2017-05-12 NOTE — Progress Notes (Signed)
Initial Nutrition Assessment    INTERVENTION:  Follow diet advancement: Start Nepro BID when patient is cleared by MD for full liquids   NUTRITION DIAGNOSIS:    Inadequate oral intake related to post op ileus as evidenced by prolonged NPO status    GOAL:  Pt to meet >/= 90% of their estimated nutrition needs      MONITOR:  Diet advancement, supplement acceptance, wt changes and labs    REASON FOR ASSESSMENT:   LOS    ASSESSMENT: Zachary [name] is a 71 yo male who has hx of ESRD and receives HD. He is s/p total colectomy (8 days ago). He developed post op ileus which has been slowly resolving. Diet advanced today to clear liquids and his intake (0-25%).  Patient also has dialysis treatment  Today and fluid removed 1.3 liters per HD nurse. The patients wt is suprisingly stable given his lack of nutrition intake for > 7 days. I expect that his wt loss is being masked by edema. He is taking a diuretic (demadex). Unable to diagnose with malnutrition at this time although I fully expect that he meets criteria. Will continue to follow his diet progression and wt changes.    Recent Labs Lab 05/07/17 1208 05/08/17 1038 05/09/17 0943 05/12/17 1242  NA 138 142 144 137  K 2.6* 3.8 4.0 3.4*  CL 99* 108 108 99*  CO2 28 23 24 25   BUN 14 41* 61* 52*  CREATININE 1.60* 4.12* 5.45* 4.73*  CALCIUM 8.5* 7.4* 8.3* 7.6*  PHOS 1.7*  --  5.9* 4.0  GLUCOSE 87 75 128* 114*    Labs and Meds reviewed  Diet Order:  Diet clear liquid Room service appropriate? Yes; Fluid consistency: Thin  Skin:   surgical incision- healing   Last BM:   7/17 large loose stool   Height:   Ht Readings from Last 1 Encounters:  05/01/17 6' (1.829 m)    Weight:   Wt Readings from Last 1 Encounters:  05/12/17 195 lb 5.2 oz (88.6 kg)    Ideal Body Weight:   81 kg  BMI:  Body mass index is 26.49 kg/m.  Estimated Nutritional Needs:  Based on SBW-81 kg  Kcal:   0177-9390  Protein:   97-105 gr  Fluid:    1.2 liters daily  EDUCATION NEEDS: none indicated at this time    Colman Cater MS,RD,CSG,LDN Office: #[phone] Pager: [phone]

## 2017-05-12 NOTE — Progress Notes (Signed)
Subjective: Patient had bowel movement this morning. No vomiting or nausea. He is trying to ambulate with walker.  Objective: Vital signs in last 24 hours: Temp:  [98.7 F (37.1 C)-99.9 F (37.7 C)] 98.7 F (37.1 C) (07/17 0637) Pulse Rate:  [111-128] 111 (07/17 0637) Resp:  [16-18] 18 (07/17 0637) BP: (90-127)/(61-70) 127/67 (07/17 0637) SpO2:  [97 %] 97 % (07/17 4982) Weight change:  Last BM Date: 05/08/17  Intake/Output from previous day: 07/16 0701 - 07/17 0700 In: 0  Out: 375 [Urine:375]  PHYSICAL EXAM General appearance: alert and no distress Resp: clear to auscultation bilaterally Cardio: S1, S2 normal GI: distended, bowel sound is hypoactive, tenderness of the incision site Extremities: extremities normal, atraumatic, no cyanosis or edema  Lab Results:  No results found for this or any previous visit (from the past 48 hour(s)).  ABGS No results for input(s): PHART, PO2ART, TCO2, HCO3 in the last 72 hours.  Invalid input(s): PCO2 CULTURES No results found for this or any previous visit (from the past 240 hour(s)). Studies/Results: No results found.  Medications: I have reviewed the patient's current medications.  Assesment:  Active Problems:   Mental retardation   Hyperlipidemia   Acute blood loss anemia   ESRD (end stage renal disease) (HCC)   Anemia in CKD (chronic kidney disease)   Colonic mass   Lower GI bleed   GI bleed   Dysplastic polyp of colon    Plan:  Medications reviewed Keep NPO Continue maintenance  IV fluid  Continue  hemodialysis.   As per surgery plan    LOS: 10 days   Janet Decesare 05/12/2017, 8:10 AM

## 2017-05-12 NOTE — Progress Notes (Addendum)
Subjective: Interval History: Patient denies any nausea or vomiting.  Objective: Vital signs in last 24 hours: Temp:  [98.7 F (37.1 C)-99.9 F (37.7 C)] 98.7 F (37.1 C) (07/17 0637) Pulse Rate:  [111-128] 111 (07/17 0637) Resp:  [16-18] 18 (07/17 0637) BP: (90-127)/(61-70) 127/67 (07/17 0637) SpO2:  [97 %] 97 % (07/17 6712) Weight change:   Intake/Output from previous day: 07/16 0701 - 07/17 0700 In: 0  Out: 375 [Urine:375] Intake/Output this shift: Total I/O In: 1817.5 [I.V.:1817.5] Out: -   Generally patient is alert tender no apparent distress Chest is clear to auscultation Heart exam regular rate and rhythm no murmur Abdomen: Abdomen is distended. Incision site is clean and healing. Extremities no edema  Lab Results:  Recent Labs  05/09/17 0943  WBC 8.5  HGB 8.7*  HCT 26.8*  PLT 246   BMET:   Recent Labs  05/09/17 0943  NA 144  K 4.0  CL 108  CO2 24  GLUCOSE 128*  BUN 61*  CREATININE 5.45*  CALCIUM 8.3*   No results for input(s): PTH in the last 72 hours. Iron Studies:  No results for input(s): IRON, TIBC, TRANSFERRIN, FERRITIN in the last 72 hours.  Studies/Results: No results found.  I have reviewed the patient's current medications.  Assessment/Plan: Problem #1. Anemia: Secondary to GI bleeding. His hemoglobin is below target goal but stable. Problem #2 end-stage renal disease: He is status post hemodialysis status post hemodialysis on Saturday. Presently is asymptomatic. Problem #3 bone and metabolic disorder: His calcium and phosphorus is in range.  Problem #4 hypertension: His blood pressure is reasonably controlled  Problem #5 fluid management: Patient doesn't have any sign and symptom of fluid overload. . Patient is on IV fluid. Problem #6 history of DVT:  Problem #7 history of obstructive uropathy Problem #8  Status post colectomy: Plan: 1) Will dialyze patient today for 4 hours and remove liters. 2] we'll check his CBC and renal  panel in the morning. 3] we'll continue with Epogen IV after each dialysis.   LOS: 10 days   Favio Moder S 05/12/2017,8:15 AM

## 2017-05-12 NOTE — Progress Notes (Signed)
8 Days Post-Op  Subjective: Patient had a bowel movement yesterday. Denies emesis. Is passing flatus.  Objective: Vital signs in last 24 hours: Temp:  [98.7 F (37.1 C)-99.9 F (37.7 C)] 98.7 F (37.1 C) (07/17 0637) Pulse Rate:  [111-128] 111 (07/17 0637) Resp:  [16-18] 18 (07/17 0637) BP: (90-127)/(61-70) 127/67 (07/17 0637) SpO2:  [97 %] 97 % (07/17 0637) Last BM Date: 05/08/17  Intake/Output from previous day: 07/16 0701 - 07/17 0700 In: 0  Out: 375 [Urine:375] Intake/Output this shift: Total I/O In: 1817.5 [I.V.:1817.5] Out: -   General appearance: alert, cooperative and no distress GI: Soft and mildly distended. Not tense. Incision healing well. Occasional bowel sounds appreciated.  Lab Results:   Recent Labs  05/09/17 0943  WBC 8.5  HGB 8.7*  HCT 26.8*  PLT 246   BMET  Recent Labs  05/09/17 0943  NA 144  K 4.0  CL 108  CO2 24  GLUCOSE 128*  BUN 61*  CREATININE 5.45*  CALCIUM 8.3*   PT/INR No results for input(s): LABPROT, INR in the last 72 hours.  Studies/Results: No results found.  Anti-infectives: Anti-infectives    Start     Dose/Rate Route Frequency Ordered Stop   05/04/17 0935  ertapenem (INVANZ) 1 g in sodium chloride 0.9 % 50 mL IVPB     1 g 100 mL/hr over 30 Minutes Intravenous On call to O.R. 05/04/17 0935 05/04/17 1014      Assessment/Plan: s/p Procedure(s): TOTAL COLECTOMY Impression: Postoperative ileus, slowly resolving. We'll encourage ambulation. Will start clear liquid diet.  LOS: 10 days    Aviva Signs 05/12/2017

## 2017-05-12 NOTE — Progress Notes (Signed)
Pt retained urine with no output from admission to unit until around 0000 05/12/17. Pt bladder was scanner showing a volume of 174ml. Continue to monitor

## 2017-05-13 LAB — CBC
HCT: 29.7 % — ABNORMAL LOW (ref 39.0–52.0)
Hemoglobin: 9.7 g/dL — ABNORMAL LOW (ref 13.0–17.0)
MCH: 30.7 pg (ref 26.0–34.0)
MCHC: 32.7 g/dL (ref 30.0–36.0)
MCV: 94 fL (ref 78.0–100.0)
Platelets: 380 10*3/uL (ref 150–400)
RBC: 3.16 MIL/uL — ABNORMAL LOW (ref 4.22–5.81)
RDW: 16.9 % — ABNORMAL HIGH (ref 11.5–15.5)
WBC: 6.5 10*3/uL (ref 4.0–10.5)

## 2017-05-13 LAB — RENAL FUNCTION PANEL
Albumin: 2.7 g/dL — ABNORMAL LOW (ref 3.5–5.0)
Anion gap: 17 — ABNORMAL HIGH (ref 5–15)
BUN: 60 mg/dL — ABNORMAL HIGH (ref 6–20)
CO2: 25 mmol/L (ref 22–32)
Calcium: 7.7 mg/dL — ABNORMAL LOW (ref 8.9–10.3)
Chloride: 95 mmol/L — ABNORMAL LOW (ref 101–111)
Creatinine, Ser: 5.8 mg/dL — ABNORMAL HIGH (ref 0.61–1.24)
GFR calc Af Amer: 10 mL/min — ABNORMAL LOW (ref >60)
GFR calc non Af Amer: 9 mL/min — ABNORMAL LOW (ref >60)
Glucose, Bld: 116 mg/dL — ABNORMAL HIGH (ref 65–99)
Phosphorus: 5.8 mg/dL — ABNORMAL HIGH (ref 2.5–4.6)
Potassium: 3.6 mmol/L (ref 3.5–5.1)
Sodium: 137 mmol/L (ref 135–145)

## 2017-05-13 MED ORDER — FLEET ENEMA 7-19 GM/118ML RE ENEM
1.0000 | ENEMA | Freq: Once | RECTAL | Status: AC
Start: 2017-05-13 — End: 2017-05-13
  Administered 2017-05-13: 1 via RECTAL

## 2017-05-13 MED ORDER — MAGNESIUM HYDROXIDE 400 MG/5ML PO SUSP
30.0000 mL | Freq: Once | ORAL | Status: AC
  Administered 2017-05-13: 30 mL via ORAL
  Filled 2017-05-13: qty 30

## 2017-05-13 NOTE — Plan of Care (Signed)
Problem: Bowel/Gastric: Goal: Gastrointestinal status for postoperative course will improve Outcome: Not Progressing Patient remains distended, passing liquid stool.  Poor appetite.  Will give fleets enema and MOM per MD orders

## 2017-05-13 NOTE — Progress Notes (Signed)
Zachary [name]  MRN: [id number]  DOB/AGE: [birth year] 71 y.o.  Primary Care Physician:Fanta, Tesfaye, MD  Admit date: 05/01/2017  Chief Complaint:  Chief Complaint  Patient presents with  . Abnormal Lab    low hgb    S-Pt presented on  05/01/2017 with  Chief Complaint  Patient presents with  . Abnormal Lab    low hgb  .    Pt offers no new complaints of pain.       Meds . atorvastatin  40 mg Oral QPM  . bisacodyl  10 mg Rectal BID  . epoetin (EPOGEN/PROCRIT) injection  10,000 Units Intravenous Q T,Th,Sa-HD  . levothyroxine  75 mcg Oral QAC breakfast  . metoCLOPramide (REGLAN) injection  10 mg Intravenous Q8H  . multivitamin with minerals  1 tablet Oral QPM  . pantoprazole  80 mg Oral Daily  . polyethylene glycol  17 g Oral BID  . tamsulosin  0.4 mg Oral Q2000  . torsemide  50 mg Oral Daily     Physical Exam: Vital signs in last 24 hours: Temp:  [98.7 F (37.1 C)-99.9 F (37.7 C)] 99.2 F (37.3 C) (07/18 0432) Pulse Rate:  [110-121] 118 (07/18 0432) Resp:  [18-20] 20 (07/18 0432) BP: (122-148)/(63-83) 134/64 (07/18 0432) SpO2:  [96 %-98 %] 96 % (07/18 0432) Weight:  [195 lb 5.2 oz (88.6 kg)-195 lb 14.4 oz (88.9 kg)] 195 lb 14.4 oz (88.9 kg) (07/18 0432) Weight change:  Last BM Date: 05/08/17  Intake/Output from previous day: 07/17 0701 - 07/18 0700 In: 2916.3 [P.O.:480; I.V.:2436.3] Out: 1505 [Urine:200; Stool:5] No intake/output data recorded.   Physical Exam: General- pt is awake, follow commands Resp- No acute REsp distress, NO Rhonchi CVS- S1S2 regular in rate and rhythm GIT- BS minimal but presemt+, staples in situ,Distended+ EXT- NO LE Edema, NO Cyanosis Access- AVF +  Lab Results: CBC  Recent Labs  05/12/17 1242 05/13/17 0752  WBC 4.3 6.5  HGB 8.6* 9.7*  HCT 25.9* 29.7*  PLT 287 380    BMET  Recent Labs  05/12/17 1242 05/13/17 0752  NA 137 137  K 3.4* 3.6  CL 99* 95*  CO2 25 25  GLUCOSE 114* 116*  BUN 52* 60*  CREATININE  4.73* 5.80*  CALCIUM 7.6* 7.7*     MICRO No results found for this or any previous visit (from the past 240 hour(s)).    Lab Results  Component Value Date   PTH 61 10/09/2015   CALCIUM 7.7 (L) 05/13/2017   CAION 1.24 10/08/2015   PHOS 5.8 (H) 05/13/2017                Impression: 1)Renal  ESRD on HD                Pt is on TTS schedule as outpt                Pt was dialyzed yesterday.                No need of HD today  2)HTN  BP stable  3)Anemia IN ESRD the goal for HGB is 9-11. hgb not at goal on EPO during hd.   4)CKD Mineral-Bone Disorder Calcium when corrected for low albumin is at goal Phosphorus not  at goal.   On lower side sec to poor po intake    Not on any binders sec to NPO   5)CNS-Hx of mental Retardation  PMD following  6)Electrolytes  Hyperkalemic   Hypokalemic  labs were intra dialytic   Now better Normonatremia    7)Acid base Co2  at goal.  8) GI-pt is s/p total colectomy Now with ileus Sx following   Plan:  Will continue current tx. No need of HD today.     BHUTANI,MANPREET S 05/13/2017, 9:29 AM

## 2017-05-13 NOTE — Progress Notes (Signed)
Subjective: Patient feels nauseated. Claims he had vomiting (??). He is not toleraqting diet. His abdomen is distended.  Objective: Vital signs in last 24 hours: Temp:  [98.7 F (37.1 C)-99.9 F (37.7 C)] 99.2 F (37.3 C) (07/18 0432) Pulse Rate:  [110-121] 118 (07/18 0432) Resp:  [18-20] 20 (07/18 0432) BP: (122-148)/(63-83) 134/64 (07/18 0432) SpO2:  [96 %-98 %] 96 % (07/18 0432) Weight:  [88.6 kg (195 lb 5.2 oz)-88.9 kg (195 lb 14.4 oz)] 88.9 kg (195 lb 14.4 oz) (07/18 0432) Weight change:  Last BM Date: 05/08/17  Intake/Output from previous day: 07/17 0701 - 07/18 0700 In: 2916.3 [P.O.:480; I.V.:2436.3] Out: 1505 [Urine:200; Stool:5]  PHYSICAL EXAM General appearance: alert and no distress Resp: clear to auscultation bilaterally Cardio: S1, S2 normal GI: distended, bowel sound is hypoactive, tenderness of the incision site Extremities: extremities normal, atraumatic, no cyanosis or edema  Lab Results:  Results for orders placed or performed during the hospital encounter of 05/01/17 (from the past 48 hour(s))  CBC     Status: Abnormal   Collection Time: 05/12/17 12:42 PM  Result Value Ref Range   WBC 4.3 4.0 - 10.5 K/uL   RBC 2.79 (L) 4.22 - 5.81 MIL/uL   Hemoglobin 8.6 (L) 13.0 - 17.0 g/dL   HCT 25.9 (L) 39.0 - 52.0 %   MCV 92.8 78.0 - 100.0 fL   MCH 30.8 26.0 - 34.0 pg   MCHC 33.2 30.0 - 36.0 g/dL   RDW 16.7 (H) 11.5 - 15.5 %   Platelets 287 150 - 400 K/uL  Renal function panel     Status: Abnormal   Collection Time: 05/12/17 12:42 PM  Result Value Ref Range   Sodium 137 135 - 145 mmol/L   Potassium 3.4 (L) 3.5 - 5.1 mmol/L   Chloride 99 (L) 101 - 111 mmol/L   CO2 25 22 - 32 mmol/L   Glucose, Bld 114 (H) 65 - 99 mg/dL   BUN 52 (H) 6 - 20 mg/dL   Creatinine, Ser 4.73 (H) 0.61 - 1.24 mg/dL   Calcium 7.6 (L) 8.9 - 10.3 mg/dL   Phosphorus 4.0 2.5 - 4.6 mg/dL   Albumin 2.5 (L) 3.5 - 5.0 g/dL   GFR calc non Af Amer 12 (L) >60 mL/min   GFR calc Af Amer 13 (L)  >60 mL/min    Comment: (NOTE) The eGFR has been calculated using the CKD EPI equation. This calculation has not been validated in all clinical situations. eGFR's persistently <60 mL/min signify possible Chronic Kidney Disease.    Anion gap 13 5 - 15    ABGS No results for input(s): PHART, PO2ART, TCO2, HCO3 in the last 72 hours.  Invalid input(s): PCO2 CULTURES No results found for this or any previous visit (from the past 240 hour(s)). Studies/Results: No results found.  Medications: I have reviewed the patient's current medications.  Assesment:  Active Problems:   Mental retardation   Hyperlipidemia   Acute blood loss anemia   ESRD (end stage renal disease) (HCC)   Anemia in CKD (chronic kidney disease)   Colonic mass   Lower GI bleed   GI bleed   Dysplastic polyp of colon    Plan:  Medications reviewed Continue maintenance  IV fluid  Continue  hemodialysis.   As per surgery plan    LOS: 11 days   Zachary [name] 05/13/2017, 8:21 AM

## 2017-05-13 NOTE — Progress Notes (Signed)
9 Days Post-Op  Subjective: Still distended. Is nauseated, but no emesis recorded.  Objective: Vital signs in last 24 hours: Temp:  [98.7 F (37.1 C)-99.9 F (37.7 C)] 99.2 F (37.3 C) (07/18 0432) Pulse Rate:  [110-121] 118 (07/18 0432) Resp:  [18-20] 20 (07/18 0432) BP: (122-148)/(63-83) 134/64 (07/18 0432) SpO2:  [96 %-98 %] 96 % (07/18 0432) Weight:  [195 lb 5.2 oz (88.6 kg)-195 lb 14.4 oz (88.9 kg)] 195 lb 14.4 oz (88.9 kg) (07/18 0432) Last BM Date: 05/08/17  Intake/Output from previous day: 07/17 0701 - 07/18 0700 In: 2916.3 [P.O.:480; I.V.:2436.3] Out: 1505 [Urine:200; Stool:5] Intake/Output this shift: No intake/output data recorded.  General appearance: alert, cooperative and no distress GI: Soft but distended. Minimal bowel sounds appreciated. Incision healing well.  Lab Results:   Recent Labs  05/12/17 1242 05/13/17 0752  WBC 4.3 6.5  HGB 8.6* 9.7*  HCT 25.9* 29.7*  PLT 287 380   BMET  Recent Labs  05/12/17 1242 05/13/17 0752  NA 137 137  K 3.4* 3.6  CL 99* 95*  CO2 25 25  GLUCOSE 114* 116*  BUN 52* 60*  CREATININE 4.73* 5.80*  CALCIUM 7.6* 7.7*   PT/INR No results for input(s): LABPROT, INR in the last 72 hours.  Studies/Results: No results found.  Anti-infectives: Anti-infectives    Start     Dose/Rate Route Frequency Ordered Stop   05/04/17 0935  ertapenem (INVANZ) 1 g in sodium chloride 0.9 % 50 mL IVPB     1 g 100 mL/hr over 30 Minutes Intravenous On call to O.R. 05/04/17 0935 05/04/17 1014      Assessment/Plan: s/p Procedure(s): TOTAL COLECTOMY Impression: Postoperative ileus. Will give gentle fleets enema. Awaiting return of bowel function.  LOS: 11 days    Aviva Signs 05/13/2017

## 2017-05-13 NOTE — Progress Notes (Signed)
Physical Therapy Treatment Patient Details Name: Zachary [name] MRN: [id number] DOB: June 11, 1946 Today's Date: 05/13/2017    History of Present Illness Zachary [name] is a 71yo black male who comes to [facility] on 7/6 d/t low Hb <6.0. He is now s/p transfusion, Hb stable, and s/p total colectomy (7/9). PMH: DM, ID (intellectual disability), HTN, ESRD on HD T/R/Sa, s/p Rt distal femur fracture and ORIF (Jan 2018).  Pt remains somewhat distended in abd, but pain is improved since PT eval.     PT Comments    Pt tolerating session well, fewer limitations from post-op abd pain, and fewer limitations from nausea, compared to 5da at PT eval.Pt initially hesitant to mobilize OOB this date, but is more persuasive to encouragement than in prior session. He requires less physical assistance, and is able to participate in simple exercises in chair. He asks to brush teeth and he is provided set up to do some with modified independence. Seated in chair, pt repositions frequently from reclined to forward flexed, no verbal confirmation, but suspected to be in response to discomfort imposed by abd distension. Near end of session pt becomes suddenly anxious and impulsive, reporting that he insists on returning to bed and that he does not care what the doctor recommends, but eventually is agreeable to trialing some new posturing in geri chair to achieve improved comfort. Lunch tray arrives prior to exit, and meal is presented to patient, initially uninterested, but commencing a meaningful attempt at self-feeding of orange jello as author exits room. Overall, good progress towards goals.    Follow Up Recommendations  SNF     Equipment Recommendations  None recommended by PT    Recommendations for Other Services       Precautions / Restrictions Precautions Precautions: Fall Restrictions Weight Bearing Restrictions: No    Mobility  Bed Mobility Overal bed mobility: Needs Assistance Bed Mobility: Supine to Sit      Supine to sit: Min assist     General bed mobility comments: no demonstrated limitations from postop pain; mild trunk weaness, less required physical assist compared to 5da.   Transfers Overall transfer level: Needs assistance Equipment used: Rolling walker (2 wheeled) Transfers: Sit to/from [facility] Sit to Stand: Min assist Stand pivot transfers: Min guard       General transfer comment: slow, labored, with difficult transition from chair arms to RW. refuses recs for safe RW use and modified form, which may be related to ID. Performed 3 more times for therex from chair, ultimately refusing additional performance d/t c/o Left knee pain   Ambulation/Gait                 Stairs            Wheelchair Mobility    Modified Rankin (Stroke Patients Only)       Balance Overall balance assessment: Needs assistance         Standing balance support: During functional activity;Bilateral upper extremity supported Standing balance-Leahy Scale: Fair                              Cognition Arousal/Alertness: Awake/alert Behavior During Therapy: WFL for tasks assessed/performed (cooperative; mild intermittent anxiety ) Overall Cognitive Status: History of cognitive impairments - at baseline (intellectual disability )                         Following Commands: Follows  multi-step commands consistently       General Comments: communicates concerns calmly adn intelligently, askes to brush teeth, which he is set up to perform modI       Exercises General Exercises - Lower Extremity Long Arc Quad: AROM;Both;10 reps;Seated Hip Flexion/Marching: AROM;Both;10 reps;Seated Other Exercises Other Exercises: STS from chair c RW x3, Pt refuses more due to left knee pain     General Comments        Pertinent Vitals/Pain Pain Assessment: No/denies pain    Home Living                      Prior Function            PT  Goals (current goals can now be found in the care plan section) Acute Rehab PT Goals Patient Stated Goal: decrease pain, regain strength  PT Goal Formulation: With patient Time For Goal Achievement: 05/22/17 Potential to Achieve Goals: Fair Progress towards PT goals: Progressing toward goals    Frequency    Min 2X/week      PT Plan Current plan remains appropriate    Co-evaluation              AM-PAC PT "6 Clicks" Daily Activity  Outcome Measure  Difficulty turning over in bed (including adjusting bedclothes, sheets and blankets)?: A Lot Difficulty moving from lying on back to sitting on the side of the bed? : Total Difficulty sitting down on and standing up from a chair with arms (e.g., wheelchair, bedside commode, etc,.)?: Total Help needed moving to and from a bed to chair (including a wheelchair)?: A Lot Help needed walking in hospital room?: Total Help needed climbing 3-5 steps with a railing? : Total 6 Click Score: 8    End of Session   Activity Tolerance: No increased pain;Treatment limited secondary to agitation;Patient limited by pain (RW) Patient left: in chair;with call bell/phone within reach;Other (comment) (meal tray opened and presented ) Nurse Communication: Mobility status (decreases level of assist, anxiety for return to bed. ) PT Visit Diagnosis: Muscle weakness (generalized) (M62.81);Difficulty in walking, not elsewhere classified (R26.2)     Time: 5625-6389 PT Time Calculation (min) (ACUTE ONLY): 29 min  Charges:  $Therapeutic Activity: 23-37 mins                    G Codes:       12:25 PM, 2017/05/29 Etta Grandchild, PT, DPT Physical Therapist - [place] [phone] (Office)     Buccola,Allan C 2017/05/29, 12:18 PM

## 2017-05-14 ENCOUNTER — Encounter (HOSPITAL_COMMUNITY): Payer: Self-pay | Admitting: Internal Medicine

## 2017-05-14 MED ORDER — NEPRO/CARBSTEADY PO LIQD
237.0000 mL | Freq: Two times a day (BID) | ORAL | Status: DC
  Administered 2017-05-14: 237 mL via ORAL

## 2017-05-14 MED ORDER — EPOETIN ALFA 10000 UNIT/ML IJ SOLN
INTRAMUSCULAR | Status: AC
  Administered 2017-05-14: 10000 [IU] via INTRAVENOUS
  Filled 2017-05-14: qty 1

## 2017-05-14 NOTE — Care Management Note (Signed)
Case Management Note  Patient Details  Name: Zachary [name] MRN: [id number] Date of Birth: 1946/04/04   Status of Service:  In process, will continue to follow  If discussed at Long Length of Stay Meetings, dates discussed:   05/14/2017 Additional Comments:  Brendon Christoffel, Chauncey Reading, RN 05/14/2017, 10:35 AM

## 2017-05-14 NOTE — Procedures (Signed)
   HEMODIALYSIS TREATMENT NOTE:  4 hour heparin-free dialysis completed via right upper arm AVF (16g/antegrade). Goal NOT met: Unable to tolerate removal of 2L as ordered. Ultrafiltration rate was decreased in response to declining intradialytic BP (asymptomatic). Net UF 1.8 L. All blood was returned and hemostasis was achieved within 15 minutes. Report given to Karolee Ohs, RN.  Rockwell Alexandria, RN, CDN

## 2017-05-14 NOTE — Progress Notes (Signed)
Subjective: Patient feels better today. He had bowel movement and his abdominal distension is less. His diet is advanced by Dr. Arnoldo Morale. Objective: Vital signs in last 24 hours: Temp:  [98.4 F (36.9 C)-98.6 F (37 C)] 98.4 F (36.9 C) (07/19 0355) Pulse Rate:  [112-117] 112 (07/19 0355) Resp:  [18-20] 18 (07/19 0355) BP: (123-126)/(66-74) 126/74 (07/19 0355) SpO2:  [97 %-98 %] 98 % (07/19 0355) Weight:  [87.5 kg (193 lb)] 87.5 kg (193 lb) (07/19 0500) Weight change: -1.056 kg (-2 lb 5.2 oz) Last BM Date: 05/13/17  Intake/Output from previous day: 07/18 0701 - 07/19 0700 In: 120 [P.O.:120] Out: -   PHYSICAL EXAM General appearance: alert and no distress Resp: clear to auscultation bilaterally Cardio: S1, S2 normal GI: distended, bowel sound is hypoactive, tenderness of the incision site Extremities: extremities normal, atraumatic, no cyanosis or edema  Lab Results:  Results for orders placed or performed during the hospital encounter of 05/01/17 (from the past 48 hour(s))  CBC     Status: Abnormal   Collection Time: 05/12/17 12:42 PM  Result Value Ref Range   WBC 4.3 4.0 - 10.5 K/uL   RBC 2.79 (L) 4.22 - 5.81 MIL/uL   Hemoglobin 8.6 (L) 13.0 - 17.0 g/dL   HCT 25.9 (L) 39.0 - 52.0 %   MCV 92.8 78.0 - 100.0 fL   MCH 30.8 26.0 - 34.0 pg   MCHC 33.2 30.0 - 36.0 g/dL   RDW 16.7 (H) 11.5 - 15.5 %   Platelets 287 150 - 400 K/uL  Renal function panel     Status: Abnormal   Collection Time: 05/12/17 12:42 PM  Result Value Ref Range   Sodium 137 135 - 145 mmol/L   Potassium 3.4 (L) 3.5 - 5.1 mmol/L   Chloride 99 (L) 101 - 111 mmol/L   CO2 25 22 - 32 mmol/L   Glucose, Bld 114 (H) 65 - 99 mg/dL   BUN 52 (H) 6 - 20 mg/dL   Creatinine, Ser 4.73 (H) 0.61 - 1.24 mg/dL   Calcium 7.6 (L) 8.9 - 10.3 mg/dL   Phosphorus 4.0 2.5 - 4.6 mg/dL   Albumin 2.5 (L) 3.5 - 5.0 g/dL   GFR calc non Af Amer 12 (L) >60 mL/min   GFR calc Af Amer 13 (L) >60 mL/min    Comment: (NOTE) The eGFR  has been calculated using the CKD EPI equation. This calculation has not been validated in all clinical situations. eGFR's persistently <60 mL/min signify possible Chronic Kidney Disease.    Anion gap 13 5 - 15  CBC     Status: Abnormal   Collection Time: 05/13/17  7:52 AM  Result Value Ref Range   WBC 6.5 4.0 - 10.5 K/uL   RBC 3.16 (L) 4.22 - 5.81 MIL/uL   Hemoglobin 9.7 (L) 13.0 - 17.0 g/dL   HCT 29.7 (L) 39.0 - 52.0 %   MCV 94.0 78.0 - 100.0 fL   MCH 30.7 26.0 - 34.0 pg   MCHC 32.7 30.0 - 36.0 g/dL   RDW 16.9 (H) 11.5 - 15.5 %   Platelets 380 150 - 400 K/uL  Renal function panel     Status: Abnormal   Collection Time: 05/13/17  7:52 AM  Result Value Ref Range   Sodium 137 135 - 145 mmol/L   Potassium 3.6 3.5 - 5.1 mmol/L   Chloride 95 (L) 101 - 111 mmol/L   CO2 25 22 - 32 mmol/L   Glucose, Bld 116 (H)  65 - 99 mg/dL   BUN 60 (H) 6 - 20 mg/dL   Creatinine, Ser 5.80 (H) 0.61 - 1.24 mg/dL   Calcium 7.7 (L) 8.9 - 10.3 mg/dL   Phosphorus 5.8 (H) 2.5 - 4.6 mg/dL   Albumin 2.7 (L) 3.5 - 5.0 g/dL   GFR calc non Af Amer 9 (L) >60 mL/min   GFR calc Af Amer 10 (L) >60 mL/min    Comment: (NOTE) The eGFR has been calculated using the CKD EPI equation. This calculation has not been validated in all clinical situations. eGFR's persistently <60 mL/min signify possible Chronic Kidney Disease.    Anion gap 17 (H) 5 - 15    ABGS No results for input(s): PHART, PO2ART, TCO2, HCO3 in the last 72 hours.  Invalid input(s): PCO2 CULTURES No results found for this or any previous visit (from the past 240 hour(s)). Studies/Results: No results found.  Medications: I have reviewed the patient's current medications.  Assesment:  Active Problems:   Mental retardation   Hyperlipidemia   Acute blood loss anemia   ESRD (end stage renal disease) (HCC)   Anemia in CKD (chronic kidney disease)   Colonic mass   Lower GI bleed   GI bleed   Dysplastic polyp of colon    Plan:   Medications reviewed Po diet as recommended by Dr. Arnoldo Morale Continue  hemodialysis.   As per surgery plan    LOS: 12 days   Eilee Schader 05/14/2017, 8:16 AM

## 2017-05-14 NOTE — Progress Notes (Signed)
10 Days Post-Op  Subjective: Has had multiple bowel movements over the past 24 hours. He is hungry.  Objective: Vital signs in last 24 hours: Temp:  [98.4 F (36.9 C)-98.6 F (37 C)] 98.4 F (36.9 C) (07/19 0355) Pulse Rate:  [112-117] 112 (07/19 0355) Resp:  [18-20] 18 (07/19 0355) BP: (123-126)/(66-74) 126/74 (07/19 0355) SpO2:  [97 %-98 %] 98 % (07/19 0355) Weight:  [193 lb (87.5 kg)] 193 lb (87.5 kg) (07/19 0500) Last BM Date: 05/13/17  Intake/Output from previous day: 07/18 0701 - 07/19 0700 In: 120 [P.O.:120] Out: -  Intake/Output this shift: No intake/output data recorded.  General appearance: alert, cooperative and no distress GI: Soft, nontender, nondistended. Incision healing well.  Lab Results:   Recent Labs  05/12/17 1242 05/13/17 0752  WBC 4.3 6.5  HGB 8.6* 9.7*  HCT 25.9* 29.7*  PLT 287 380   BMET  Recent Labs  05/12/17 1242 05/13/17 0752  NA 137 137  K 3.4* 3.6  CL 99* 95*  CO2 25 25  GLUCOSE 114* 116*  BUN 52* 60*  CREATININE 4.73* 5.80*  CALCIUM 7.6* 7.7*   PT/INR No results for input(s): LABPROT, INR in the last 72 hours.  Studies/Results: No results found.  Anti-infectives: Anti-infectives    Start     Dose/Rate Route Frequency Ordered Stop   05/04/17 0935  ertapenem (INVANZ) 1 g in sodium chloride 0.9 % 50 mL IVPB     1 g 100 mL/hr over 30 Minutes Intravenous On call to O.R. 05/04/17 0935 05/04/17 1014      Assessment/Plan: s/p Procedure(s): TOTAL COLECTOMY Impression: Postoperative ileus resolving. Will advance to regular renal diet. Anticipate discharge in next 24-48 hours.  LOS: 12 days    Aviva Signs 05/14/2017

## 2017-05-14 NOTE — Progress Notes (Signed)
Subjective: Interval History: Patient denies any nausea or vomiting.No abdominal pain  Objective: Vital signs in last 24 hours: Temp:  [98.4 F (36.9 C)-98.6 F (37 C)] 98.4 F (36.9 C) (07/19 0355) Pulse Rate:  [112-117] 112 (07/19 0355) Resp:  [18-20] 18 (07/19 0355) BP: (123-126)/(66-74) 126/74 (07/19 0355) SpO2:  [97 %-98 %] 98 % (07/19 0355) Weight:  [87.5 kg (193 lb)] 87.5 kg (193 lb) (07/19 0500) Weight change: -1.056 kg (-2 lb 5.2 oz)  Intake/Output from previous day: 07/18 0701 - 07/19 0700 In: 120 [P.O.:120] Out: -  Intake/Output this shift: No intake/output data recorded.  Generally patient is alert tender no apparent distress Chest is clear to auscultation Heart exam regular rate and rhythm no murmur Abdomen: Abdomen none tender. Incision site  is clean and healing. Positive abdominal sounds Extremities no edema  Lab Results:  Recent Labs  05/12/17 1242 05/13/17 0752  WBC 4.3 6.5  HGB 8.6* 9.7*  HCT 25.9* 29.7*  PLT 287 380   BMET:   Recent Labs  05/12/17 1242 05/13/17 0752  NA 137 137  K 3.4* 3.6  CL 99* 95*  CO2 25 25  GLUCOSE 114* 116*  BUN 52* 60*  CREATININE 4.73* 5.80*  CALCIUM 7.6* 7.7*   No results for input(s): PTH in the last 72 hours. Iron Studies:  No results for input(s): IRON, TIBC, TRANSFERRIN, FERRITIN in the last 72 hours.  Studies/Results: No results found.  I have reviewed the patient's current medications.  Assessment/Plan: Problem #1. Anemia: Secondary to GI bleeding. His hemoglobin is below target goal but Improving. Problem #2 end-stage renal disease: He is status post hemodialysis status post hemodialysis on Tuesday. Presently is asymptomatic. Problem #3 bone and metabolic disorder: His calcium and phosphorus is in range.  Problem #4 hypertension: His blood pressure is reasonably controlled  Problem #5 fluid management: Patient doesn't have any sign and symptom of fluid overload. . Patient is on IV  fluid. Problem #6 history of DVT: Problem #7  Status post colectomy: Presently patient does not have any pain. He is moving his bowels.  Plan: 1) Will dialyze patient today for 4 hours and remove liters. 2] we'll check his CBC and renal panel in the morning. 3] we'll continue with Epogen IV after each dialysis.   LOS: 12 days   Jessica Checketts S 05/14/2017,8:11 AM

## 2017-05-15 DIAGNOSIS — E039 Hypothyroidism, unspecified: Secondary | ICD-10-CM | POA: Diagnosis present

## 2017-05-15 DIAGNOSIS — Z9181 History of falling: Secondary | ICD-10-CM | POA: Diagnosis not present

## 2017-05-15 DIAGNOSIS — S72421D Displaced fracture of lateral condyle of right femur, subsequent encounter for closed fracture with routine healing: Secondary | ICD-10-CM | POA: Diagnosis not present

## 2017-05-15 DIAGNOSIS — Z85038 Personal history of other malignant neoplasm of large intestine: Secondary | ICD-10-CM | POA: Diagnosis not present

## 2017-05-15 DIAGNOSIS — F70 Mild intellectual disabilities: Secondary | ICD-10-CM | POA: Diagnosis not present

## 2017-05-15 DIAGNOSIS — Z79899 Other long term (current) drug therapy: Secondary | ICD-10-CM | POA: Diagnosis not present

## 2017-05-15 DIAGNOSIS — R509 Fever, unspecified: Secondary | ICD-10-CM | POA: Diagnosis not present

## 2017-05-15 DIAGNOSIS — M6281 Muscle weakness (generalized): Secondary | ICD-10-CM | POA: Diagnosis not present

## 2017-05-15 DIAGNOSIS — E1129 Type 2 diabetes mellitus with other diabetic kidney complication: Secondary | ICD-10-CM | POA: Diagnosis not present

## 2017-05-15 DIAGNOSIS — N186 End stage renal disease: Secondary | ICD-10-CM | POA: Diagnosis not present

## 2017-05-15 DIAGNOSIS — K219 Gastro-esophageal reflux disease without esophagitis: Secondary | ICD-10-CM | POA: Diagnosis not present

## 2017-05-15 DIAGNOSIS — E876 Hypokalemia: Secondary | ICD-10-CM | POA: Diagnosis not present

## 2017-05-15 DIAGNOSIS — A419 Sepsis, unspecified organism: Secondary | ICD-10-CM | POA: Diagnosis not present

## 2017-05-15 DIAGNOSIS — D631 Anemia in chronic kidney disease: Secondary | ICD-10-CM | POA: Diagnosis not present

## 2017-05-15 DIAGNOSIS — I132 Hypertensive heart and chronic kidney disease with heart failure and with stage 5 chronic kidney disease, or end stage renal disease: Secondary | ICD-10-CM | POA: Diagnosis present

## 2017-05-15 DIAGNOSIS — K635 Polyp of colon: Secondary | ICD-10-CM | POA: Diagnosis not present

## 2017-05-15 DIAGNOSIS — E1122 Type 2 diabetes mellitus with diabetic chronic kidney disease: Secondary | ICD-10-CM | POA: Diagnosis present

## 2017-05-15 DIAGNOSIS — I10 Essential (primary) hypertension: Secondary | ICD-10-CM | POA: Diagnosis not present

## 2017-05-15 DIAGNOSIS — R188 Other ascites: Secondary | ICD-10-CM | POA: Diagnosis not present

## 2017-05-15 DIAGNOSIS — N400 Enlarged prostate without lower urinary tract symptoms: Secondary | ICD-10-CM | POA: Diagnosis present

## 2017-05-15 DIAGNOSIS — R197 Diarrhea, unspecified: Secondary | ICD-10-CM | POA: Diagnosis not present

## 2017-05-15 DIAGNOSIS — N2581 Secondary hyperparathyroidism of renal origin: Secondary | ICD-10-CM | POA: Diagnosis not present

## 2017-05-15 DIAGNOSIS — Z992 Dependence on renal dialysis: Secondary | ICD-10-CM | POA: Diagnosis not present

## 2017-05-15 DIAGNOSIS — Z86718 Personal history of other venous thrombosis and embolism: Secondary | ICD-10-CM | POA: Diagnosis not present

## 2017-05-15 DIAGNOSIS — D649 Anemia, unspecified: Secondary | ICD-10-CM | POA: Diagnosis not present

## 2017-05-15 DIAGNOSIS — R110 Nausea: Secondary | ICD-10-CM | POA: Diagnosis not present

## 2017-05-15 DIAGNOSIS — E785 Hyperlipidemia, unspecified: Secondary | ICD-10-CM | POA: Diagnosis present

## 2017-05-15 DIAGNOSIS — K529 Noninfective gastroenteritis and colitis, unspecified: Secondary | ICD-10-CM | POA: Diagnosis not present

## 2017-05-15 DIAGNOSIS — E1121 Type 2 diabetes mellitus with diabetic nephropathy: Secondary | ICD-10-CM | POA: Diagnosis not present

## 2017-05-15 DIAGNOSIS — N401 Enlarged prostate with lower urinary tract symptoms: Secondary | ICD-10-CM | POA: Diagnosis not present

## 2017-05-15 DIAGNOSIS — K625 Hemorrhage of anus and rectum: Secondary | ICD-10-CM | POA: Diagnosis not present

## 2017-05-15 DIAGNOSIS — D509 Iron deficiency anemia, unspecified: Secondary | ICD-10-CM | POA: Diagnosis not present

## 2017-05-15 DIAGNOSIS — Z9049 Acquired absence of other specified parts of digestive tract: Secondary | ICD-10-CM | POA: Diagnosis not present

## 2017-05-15 DIAGNOSIS — F79 Unspecified intellectual disabilities: Secondary | ICD-10-CM | POA: Diagnosis not present

## 2017-05-15 DIAGNOSIS — N3289 Other specified disorders of bladder: Secondary | ICD-10-CM | POA: Diagnosis not present

## 2017-05-15 DIAGNOSIS — R1111 Vomiting without nausea: Secondary | ICD-10-CM | POA: Diagnosis not present

## 2017-05-15 NOTE — Progress Notes (Addendum)
LCSW following for disposition.  Patient medically stable for discharge to return to [facility]. Call placed to [facility] and message left regarding return All documents sent to facility for review. Patient will transport but hospital staff. Guardian made aware of plan, agreeable.  Will updated RN as well as patient/family once facility has returned call.  Lane Hacker, MSW Clinical Social Work: [employer] Coverage for :  [phone]

## 2017-05-15 NOTE — Progress Notes (Signed)
11 Days Post-Op  Subjective: Tolerating by mouth well. Is having multiple bowel movements. Has no complaints.  Objective: Vital signs in last 24 hours: Temp:  [98.1 F (36.7 C)-99.5 F (37.5 C)] 99.5 F (37.5 C) (07/20 0349) Pulse Rate:  [100-124] 110 (07/20 0349) Resp:  [16-18] 18 (07/20 0349) BP: (96-128)/(50-71) 113/58 (07/20 0349) SpO2:  [93 %-99 %] 98 % (07/20 0349) Weight:  [191 lb 12.8 oz (87 kg)-192 lb 10.9 oz (87.4 kg)] 191 lb 12.8 oz (87 kg) (07/20 0349) Last BM Date: 05/15/17  Intake/Output from previous day: 07/19 0701 - 07/20 0700 In: 480 [P.O.:480] Out: 3096 [Urine:240] Intake/Output this shift: No intake/output data recorded.  General appearance: alert, cooperative and no distress GI: Soft, incision healing well. Staples removed, sterile strips applied.  Lab Results:   Recent Labs  05/12/17 1242 05/13/17 0752  WBC 4.3 6.5  HGB 8.6* 9.7*  HCT 25.9* 29.7*  PLT 287 380   BMET  Recent Labs  05/12/17 1242 05/13/17 0752  NA 137 137  K 3.4* 3.6  CL 99* 95*  CO2 25 25  GLUCOSE 114* 116*  BUN 52* 60*  CREATININE 4.73* 5.80*  CALCIUM 7.6* 7.7*   PT/INR No results for input(s): LABPROT, INR in the last 72 hours.  Studies/Results: No results found.  Anti-infectives: Anti-infectives    Start     Dose/Rate Route Frequency Ordered Stop   05/04/17 0935  ertapenem (INVANZ) 1 g in sodium chloride 0.9 % 50 mL IVPB     1 g 100 mL/hr over 30 Minutes Intravenous On call to O.R. 05/04/17 0935 05/04/17 1014      Assessment/Plan: s/p Procedure(s): TOTAL COLECTOMY Impression: Has recovered from surgery. Okay for discharge from surgery standpoint. No need for follow-up from my standpoint in my office.  LOS: 13 days    Aviva Signs 05/15/2017

## 2017-05-15 NOTE — Progress Notes (Signed)
PIV removed, pt has blister to left upper arm, near IV site. Unable to obtain peripheral access at this time, will inform oncoming nurse.

## 2017-05-15 NOTE — Progress Notes (Signed)
Informed Dr Legrand Rams of loss of IV access overnight, OK to leave IV out.

## 2017-05-15 NOTE — Progress Notes (Signed)
Report called to [place] at the [facility].  Pt to be transferred with belongings to Room 112 by NT.

## 2017-05-15 NOTE — Discharge Summary (Signed)
Physician Discharge Summary  Patient ID: LENON KUENNEN MRN: [id number] DOB/AGE: Apr 15, 1946 71 y.o. Primary Care Physician:Imir Brumbach, MD Admit date: 05/01/2017 Discharge date: 05/15/2017    Discharge Diagnoses:   Active Problems:   Mental retardation   Hyperlipidemia   Acute blood loss anemia   ESRD (end stage renal disease) (HCC)   Anemia in CKD (chronic kidney disease)   Colonic mass   Lower GI bleed   GI bleed   Dysplastic polyp of colon S/p colectomy DVT of the lower extremity  Allergies as of 05/15/2017   No Known Allergies     Medication List    STOP taking these medications   warfarin 4 MG tablet Commonly known as:  COUMADIN     TAKE these medications   atorvastatin 40 MG tablet Commonly known as:  LIPITOR Take 40 mg by mouth every evening.   enoxaparin 100 MG/ML injection Commonly known as:  LOVENOX Inject 85 mg into the skin every 12 (twelve) hours.   feeding supplement (PRO-STAT 64) Liqd Take 30 mLs by mouth 2 (two) times daily between meals.   FIRST AID PLUS LIDOCAINE EX Apply 1 application topically Every Tuesday,Thursday,and Saturday with dialysis. Prior to dialysis   levothyroxine 75 MCG tablet Commonly known as:  SYNTHROID, LEVOTHROID Take 75 mcg by mouth daily before breakfast.   multivitamin with minerals Tabs tablet Take 1 tablet by mouth every evening.   NEPRO Liqd Take 1 Can by mouth 2 (two) times daily. Take 1 can by mouth twice a day   omeprazole 40 MG capsule Commonly known as:  PRILOSEC Take 40 mg by mouth every evening.   sodium phosphate Pediatric 3.5-9.5 GM/59ML enema Place 1 enema rectally once.   tamsulosin 0.4 MG Caps capsule Commonly known as:  FLOMAX Take 0.4 mg by mouth daily at 8 pm. Give 30 minutes after a meal   torsemide 10 MG tablet Commonly known as:  DEMADEX Take 5 tablets (50 mg total) by mouth daily.       Discharged Condition: improved    Consults: nephrology and general  surgery  Significant Diagnostic Studies: Ct Abdomen Pelvis Wo Contrast  Result Date: 05/09/2017 CLINICAL DATA:  71 year old male with history of recent colonoscopy and postprocedural bleeding. 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 EXAM: CT ABDOMEN AND PELVIS WITHOUT CONTRAST TECHNIQUE: Multidetector CT imaging of the abdomen and pelvis was performed following the standard protocol without IV contrast. COMPARISON:  CT of the abdomen and pelvis 03/10/2016. FINDINGS: Lower chest: There is aortic atherosclerosis, as well as the coronary arteries, including calcified atherosclerotic plaque in the left main, left anterior descending, left circumflex and right coronary arteries. Dependent opacities in the lower lobes of the lungs bilaterally, favored to predominantly reflect areas of subsegmental atelectasis, although sequela of aspiration is also suspected in the right lower lobe where there is likely some airspace consolidation. Trace bilateral pleural effusions. Hepatobiliary: No discrete cystic or solid hepatic lesions are confidently identified on today's noncontrast CT examination. Intermediate attenuation material lying dependently in the gallbladder likely reflects biliary sludge. No definite evidence of an acute cholecystitis at this time. Pancreas: No definite pancreatic mass or peripancreatic inflammatory changes noted on today's noncontrast CT examination. Spleen: Unremarkable. Adrenals/Urinary Tract: 2 tiny nonobstructive calculi are noted within the renal collecting systems bilaterally, largest of which is in the interpolar region of the left kidney measuring 2 mm. 2.1 cm low-attenuation lesion in the interpolar region of the left kidney is incompletely characterized on today's noncontrast CT examination, but  is statistically likely a cyst. Mild to moderate bilateral hydroureteronephrosis. Urinary bladder is mildly distended. Stomach/Bowel: Stomach is moderately distended. Multiple dilated loops of  small bowel measuring up to 4.1 cm in diameter with multiple air-fluid levels. No discrete transition point between dilated and nondilated small bowel is identified. Patient is status post subtotal colectomy with ileal colicky anastomosis in the central anatomic pelvis. There does appear to be some thickening of the distal ileum, which may reflect some back wash ileitis. Vascular/Lymphatic: Aortic atherosclerosis, without definite aneurysm in the abdominal or pelvic vasculature. No lymphadenopathy noted in the abdomen or pelvis. 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 Seminal vesicles are unremarkable in appearance. Other: Trace volume of ascites. Small amount of pneumoperitoneum. Healing midline anterior abdominal wall wound with overlying skin staples in place. Musculoskeletal: There are no aggressive appearing lytic or blastic lesions noted in the visualized portions of the skeleton. IMPRESSION: 1. Postoperative changes of recent subtotal colectomy with what appears to be a postoperative ileus, and small amount of postoperative Sitz ascites and pneumoperitoneum. 2. Mild moderate bilateral hydroureteronephrosis. Urinary bladder is also moderately distended. This may reflect bladder outlet obstruction from an enlarged prostate gland with median lobe hypertrophy. No other definite source of obstruction is identified. 3. Two tiny nonobstructive calculi are noted within both renal collecting systems measuring 1-2 mm in size. No definite ureteral stones are noted. 4. Biliary sludge in the gallbladder, without evidence to suggest an acute cholecystitis at this time. 5. Aortic atherosclerosis, in addition to left main and 3 vessel coronary artery disease. Please note that although the presence of coronary artery calcium documents the presence of coronary artery disease, the severity of this disease and any potential stenosis cannot be assessed on this non-gated  CT examination. Assessment for potential risk factor modification, dietary therapy or pharmacologic therapy may be warranted, if clinically indicated. 6. Additional incidental findings, as above. Aortic Atherosclerosis (ICD10-I70.0). Electronically Signed   By: Vinnie Langton M.D.   On: 05/09/2017 18:07   US Venous Img Lower Unilateral Right  Result Date: 05/01/2017 CLINICAL DATA:  History of deep venous thrombosis. Right lower extremity swelling and warmth. EXAM: RIGHT LOWER EXTREMITY VENOUS DOPPLER ULTRASOUND TECHNIQUE: Gray-scale sonography with graded compression, as well as color Doppler and duplex ultrasound were performed to evaluate the lower extremity deep venous systems from the level of the common femoral vein and including the common femoral, femoral, profunda femoral, popliteal and calf veins including the posterior tibial, peroneal and gastrocnemius veins when visible. The superficial great saphenous vein was also interrogated. Spectral Doppler was utilized to evaluate flow at rest and with distal augmentation maneuvers in the common femoral, femoral and popliteal veins. COMPARISON:  None. FINDINGS: Contralateral Common Femoral Vein: Respiratory phasicity is normal and symmetric with the symptomatic side. No evidence of thrombus. Normal compressibility. Common Femoral Vein: No evidence of thrombus. Normal compressibility, respiratory phasicity and response to augmentation. Saphenofemoral Junction: No evidence of thrombus. Normal compressibility and flow on color Doppler imaging. Profunda Femoral Vein: No evidence of thrombus. Normal compressibility and flow on color Doppler imaging. Femoral Vein: No evidence of thrombus. Normal compressibility, respiratory phasicity and response to augmentation. Popliteal Vein: No evidence of thrombus. Normal compressibility, respiratory phasicity and response to augmentation. Calf Veins: No evidence of thrombus. Normal compressibility and flow on color Doppler  imaging. Superficial Great Saphenous Vein: No evidence of thrombus. Normal compressibility and flow on color Doppler imaging. Venous Reflux:  None. Other  Findings:  None. IMPRESSION: No evidence of DVT within the right lower extremity. Electronically Signed   By: Lajean Manes M.D.   On: 05/01/2017 14:27    Lab Results: Basic Metabolic Panel:  Recent Labs  05/12/17 1242 05/13/17 0752  NA 137 137  K 3.4* 3.6  CL 99* 95*  CO2 25 25  GLUCOSE 114* 116*  BUN 52* 60*  CREATININE 4.73* 5.80*  CALCIUM 7.6* 7.7*  PHOS 4.0 5.8*   Liver Function Tests:  Recent Labs  05/12/17 1242 05/13/17 0752  ALBUMIN 2.5* 2.7*     CBC:  Recent Labs  05/12/17 1242 05/13/17 0752  WBC 4.3 6.5  HGB 8.6* 9.7*  HCT 25.9* 29.7*  MCV 92.8 94.0  PLT 287 380    No results found for this or any previous visit (from the past 240 hour(s)).   Hospital Course:   This is a 10 years ol male admitted to severe anemia and rectal bleed.. Patient was transfused and underwent colonoscopy which showed multiple dysplastic polyps. Patient was evaluated by general surgery and underwent colectomy. Post-operatively patient had abdominal distension and ileus. Now his symptoms has resolved. He is able tolerate regular diet. His staples are removed. Patient will discharged back to nursing home to continue current treatment and physical therapy. Patient will continue his regular maintenance dialysis.  Discharge Exam: Blood pressure (!) 113/58, pulse (!) 110, temperature 99.5 F (37.5 C), temperature source Oral, resp. rate 18, height 6' (1.829 m), weight 87 kg (191 lb 12.8 oz), SpO2 98 %.    Disposition:  Skilled Nursing Home    Follow-up Information    [name], MD.   Specialty:  Internal Medicine Contact information: [place] [address] [phone]           Signed: [name]   05/15/2017, 8:24 AM

## 2017-05-15 NOTE — Progress Notes (Signed)
Subjective: Interval History: Patient offers no complaints. He denies any nausea or vomiting. His moving his bowels.  Objective: Vital signs in last 24 hours: Temp:  [98.1 F (36.7 C)-99.5 F (37.5 C)] 99.5 F (37.5 C) (07/20 0349) Pulse Rate:  [100-124] 110 (07/20 0349) Resp:  [16-18] 18 (07/20 0349) BP: (96-128)/(50-71) 113/58 (07/20 0349) SpO2:  [93 %-99 %] 98 % (07/20 0349) Weight:  [87 kg (191 lb 12.8 oz)-87.4 kg (192 lb 10.9 oz)] 87 kg (191 lb 12.8 oz) (07/20 0349) Weight change: -0.144 kg (-5.1 oz)  Intake/Output from previous day: 07/19 0701 - 07/20 0700 In: 480 [P.O.:480] Out: 3096 [Urine:240] Intake/Output this shift: No intake/output data recorded.  Generally patient is alert tender no apparent distress Chest is clear to auscultation Heart exam regular rate and rhythm no murmur Abdomen: Abdomen none tender. Incision site  is clean and healing. Positive abdominal sounds Extremities no edema  Lab Results:  Recent Labs  05/12/17 1242 05/13/17 0752  WBC 4.3 6.5  HGB 8.6* 9.7*  HCT 25.9* 29.7*  PLT 287 380   BMET:   Recent Labs  05/12/17 1242 05/13/17 0752  NA 137 137  K 3.4* 3.6  CL 99* 95*  CO2 25 25  GLUCOSE 114* 116*  BUN 52* 60*  CREATININE 4.73* 5.80*  CALCIUM 7.6* 7.7*   No results for input(s): PTH in the last 72 hours. Iron Studies:  No results for input(s): IRON, TIBC, TRANSFERRIN, FERRITIN in the last 72 hours.  Studies/Results: No results found.  I have reviewed the patient's current medications.  Assessment/Plan: Problem #1. Anemia: Secondary to GI bleeding. His hemoglobin is below target goal but Improving. Problem #2 end-stage renal disease: He is status post hemodialysis status post hemodialysis Yesterday. Presently is asymptomatic. Problem #3 bone and metabolic disorder: His calcium and phosphorus is in range.  Problem #4 hypertension: His blood pressure is reasonably controlled  Problem #5 fluid management: We will able to  ultrafiltrate about 1.8 L yesterday and no sign of fluid overload. Problem #6 history of DVT: Problem #7  Status post colectomy: Presently patient does not have any pain. Improving. Plan: 1) patient does not need dialysis today. 2] his dialysis will be tomorrow as an outpatient which is his regular schedule.  LOS: 13 days   Zachary [name] S 05/15/2017,8:48 AM

## 2017-05-16 DIAGNOSIS — R110 Nausea: Secondary | ICD-10-CM | POA: Diagnosis not present

## 2017-05-16 DIAGNOSIS — D509 Iron deficiency anemia, unspecified: Secondary | ICD-10-CM | POA: Diagnosis not present

## 2017-05-16 DIAGNOSIS — N186 End stage renal disease: Secondary | ICD-10-CM | POA: Diagnosis not present

## 2017-05-16 DIAGNOSIS — N2581 Secondary hyperparathyroidism of renal origin: Secondary | ICD-10-CM | POA: Diagnosis not present

## 2017-05-16 DIAGNOSIS — D631 Anemia in chronic kidney disease: Secondary | ICD-10-CM | POA: Diagnosis not present

## 2017-05-16 DIAGNOSIS — Z992 Dependence on renal dialysis: Secondary | ICD-10-CM | POA: Diagnosis not present

## 2017-05-17 ENCOUNTER — Encounter (HOSPITAL_COMMUNITY): Payer: Self-pay

## 2017-05-17 ENCOUNTER — Emergency Department (HOSPITAL_COMMUNITY): Payer: Medicare Other

## 2017-05-17 ENCOUNTER — Inpatient Hospital Stay (HOSPITAL_COMMUNITY)
Admission: EM | Admit: 2017-05-17 | Discharge: 2017-05-21 | DRG: 871 | Disposition: A | Discharge status: Skilled Nursing Facility | Payer: Medicare Other | Attending: Internal Medicine | Admitting: Internal Medicine

## 2017-05-17 DIAGNOSIS — I132 Hypertensive heart and chronic kidney disease with heart failure and with stage 5 chronic kidney disease, or end stage renal disease: Secondary | ICD-10-CM | POA: Diagnosis present

## 2017-05-17 DIAGNOSIS — E1122 Type 2 diabetes mellitus with diabetic chronic kidney disease: Secondary | ICD-10-CM | POA: Diagnosis present

## 2017-05-17 DIAGNOSIS — E039 Hypothyroidism, unspecified: Secondary | ICD-10-CM | POA: Diagnosis present

## 2017-05-17 DIAGNOSIS — Z85038 Personal history of other malignant neoplasm of large intestine: Secondary | ICD-10-CM | POA: Diagnosis not present

## 2017-05-17 DIAGNOSIS — Z86718 Personal history of other venous thrombosis and embolism: Secondary | ICD-10-CM | POA: Diagnosis not present

## 2017-05-17 DIAGNOSIS — N186 End stage renal disease: Secondary | ICD-10-CM | POA: Diagnosis not present

## 2017-05-17 DIAGNOSIS — K529 Noninfective gastroenteritis and colitis, unspecified: Secondary | ICD-10-CM | POA: Diagnosis present

## 2017-05-17 DIAGNOSIS — F79 Unspecified intellectual disabilities: Secondary | ICD-10-CM | POA: Diagnosis not present

## 2017-05-17 DIAGNOSIS — D509 Iron deficiency anemia, unspecified: Secondary | ICD-10-CM | POA: Diagnosis not present

## 2017-05-17 DIAGNOSIS — S72421D Displaced fracture of lateral condyle of right femur, subsequent encounter for closed fracture with routine healing: Secondary | ICD-10-CM | POA: Diagnosis not present

## 2017-05-17 DIAGNOSIS — E785 Hyperlipidemia, unspecified: Secondary | ICD-10-CM | POA: Diagnosis present

## 2017-05-17 DIAGNOSIS — R509 Fever, unspecified: Secondary | ICD-10-CM

## 2017-05-17 DIAGNOSIS — R1111 Vomiting without nausea: Secondary | ICD-10-CM | POA: Diagnosis not present

## 2017-05-17 DIAGNOSIS — R197 Diarrhea, unspecified: Secondary | ICD-10-CM

## 2017-05-17 DIAGNOSIS — I10 Essential (primary) hypertension: Secondary | ICD-10-CM | POA: Diagnosis not present

## 2017-05-17 DIAGNOSIS — Z79899 Other long term (current) drug therapy: Secondary | ICD-10-CM

## 2017-05-17 DIAGNOSIS — E876 Hypokalemia: Secondary | ICD-10-CM | POA: Diagnosis not present

## 2017-05-17 DIAGNOSIS — N400 Enlarged prostate without lower urinary tract symptoms: Secondary | ICD-10-CM | POA: Diagnosis present

## 2017-05-17 DIAGNOSIS — N189 Chronic kidney disease, unspecified: Secondary | ICD-10-CM | POA: Diagnosis present

## 2017-05-17 DIAGNOSIS — R188 Other ascites: Secondary | ICD-10-CM | POA: Diagnosis not present

## 2017-05-17 DIAGNOSIS — E1121 Type 2 diabetes mellitus with diabetic nephropathy: Secondary | ICD-10-CM

## 2017-05-17 DIAGNOSIS — E119 Type 2 diabetes mellitus without complications: Secondary | ICD-10-CM

## 2017-05-17 DIAGNOSIS — A4189 Other specified sepsis: Secondary | ICD-10-CM | POA: Diagnosis not present

## 2017-05-17 DIAGNOSIS — K219 Gastro-esophageal reflux disease without esophagitis: Secondary | ICD-10-CM | POA: Diagnosis not present

## 2017-05-17 DIAGNOSIS — Z992 Dependence on renal dialysis: Secondary | ICD-10-CM | POA: Diagnosis not present

## 2017-05-17 DIAGNOSIS — N3289 Other specified disorders of bladder: Secondary | ICD-10-CM | POA: Diagnosis not present

## 2017-05-17 DIAGNOSIS — D631 Anemia in chronic kidney disease: Secondary | ICD-10-CM | POA: Diagnosis not present

## 2017-05-17 DIAGNOSIS — E1129 Type 2 diabetes mellitus with other diabetic kidney complication: Secondary | ICD-10-CM | POA: Diagnosis not present

## 2017-05-17 DIAGNOSIS — M6281 Muscle weakness (generalized): Secondary | ICD-10-CM | POA: Diagnosis not present

## 2017-05-17 DIAGNOSIS — Z7901 Long term (current) use of anticoagulants: Secondary | ICD-10-CM | POA: Diagnosis not present

## 2017-05-17 DIAGNOSIS — N401 Enlarged prostate with lower urinary tract symptoms: Secondary | ICD-10-CM | POA: Diagnosis not present

## 2017-05-17 DIAGNOSIS — R279 Unspecified lack of coordination: Secondary | ICD-10-CM | POA: Diagnosis not present

## 2017-05-17 DIAGNOSIS — F70 Mild intellectual disabilities: Secondary | ICD-10-CM | POA: Diagnosis not present

## 2017-05-17 DIAGNOSIS — A419 Sepsis, unspecified organism: Principal | ICD-10-CM | POA: Diagnosis present

## 2017-05-17 DIAGNOSIS — Z9049 Acquired absence of other specified parts of digestive tract: Secondary | ICD-10-CM | POA: Diagnosis not present

## 2017-05-17 DIAGNOSIS — Z9181 History of falling: Secondary | ICD-10-CM | POA: Diagnosis not present

## 2017-05-17 LAB — COMPREHENSIVE METABOLIC PANEL
ALT: 17 U/L (ref 17–63)
AST: 36 U/L (ref 15–41)
Albumin: 2.3 g/dL — ABNORMAL LOW (ref 3.5–5.0)
Alkaline Phosphatase: 102 U/L (ref 38–126)
Anion gap: 13 (ref 5–15)
BUN: 28 mg/dL — ABNORMAL HIGH (ref 6–20)
CO2: 24 mmol/L (ref 22–32)
Calcium: 7.6 mg/dL — ABNORMAL LOW (ref 8.9–10.3)
Chloride: 102 mmol/L (ref 101–111)
Creatinine, Ser: 4.49 mg/dL — ABNORMAL HIGH (ref 0.61–1.24)
GFR calc Af Amer: 14 mL/min — ABNORMAL LOW (ref >60)
GFR calc non Af Amer: 12 mL/min — ABNORMAL LOW (ref >60)
Glucose, Bld: 131 mg/dL — ABNORMAL HIGH (ref 65–99)
Potassium: 3.6 mmol/L (ref 3.5–5.1)
Sodium: 139 mmol/L (ref 135–145)
Total Bilirubin: 1 mg/dL (ref 0.3–1.2)
Total Protein: 5.9 g/dL — ABNORMAL LOW (ref 6.5–8.1)

## 2017-05-17 LAB — CBC WITH DIFFERENTIAL/PLATELET
Basophils Absolute: 0.1 10*3/uL (ref 0.0–0.1)
Basophils Relative: 1 %
Eosinophils Absolute: 0.1 10*3/uL (ref 0.0–0.7)
Eosinophils Relative: 1 %
HCT: 30.6 % — ABNORMAL LOW (ref 39.0–52.0)
Hemoglobin: 9.7 g/dL — ABNORMAL LOW (ref 13.0–17.0)
Lymphocytes Relative: 21 %
Lymphs Abs: 2.2 10*3/uL (ref 0.7–4.0)
MCH: 30.1 pg (ref 26.0–34.0)
MCHC: 31.7 g/dL (ref 30.0–36.0)
MCV: 95 fL (ref 78.0–100.0)
Monocytes Absolute: 1.5 10*3/uL — ABNORMAL HIGH (ref 0.1–1.0)
Monocytes Relative: 14 %
Neutro Abs: 6.5 10*3/uL (ref 1.7–7.7)
Neutrophils Relative %: 63 %
Platelets: 360 10*3/uL (ref 150–400)
RBC: 3.22 MIL/uL — ABNORMAL LOW (ref 4.22–5.81)
RDW: 17.2 % — ABNORMAL HIGH (ref 11.5–15.5)
WBC: 10.4 10*3/uL (ref 4.0–10.5)

## 2017-05-17 LAB — I-STAT CG4 LACTIC ACID, ED
Lactic Acid, Venous: 3.15 mmol/L (ref 0.5–1.9)
Lactic Acid, Venous: 1.45 mmol/L (ref 0.5–1.9)

## 2017-05-17 MED ORDER — PANTOPRAZOLE SODIUM 40 MG PO TBEC
40.0000 mg | DELAYED_RELEASE_TABLET | Freq: Every day | ORAL | Status: DC
  Administered 2017-05-19 – 2017-05-20 (×2): 40 mg via ORAL
  Filled 2017-05-17 (×2): qty 1

## 2017-05-17 MED ORDER — TAMSULOSIN HCL 0.4 MG PO CAPS
0.4000 mg | ORAL_CAPSULE | Freq: Every day | ORAL | Status: DC
  Administered 2017-05-18 – 2017-05-20 (×3): 0.4 mg via ORAL
  Filled 2017-05-17 (×4): qty 1

## 2017-05-17 MED ORDER — DEXTROSE-NACL 5-0.9 % IV SOLN
INTRAVENOUS | Status: DC
  Administered 2017-05-17: 23:00:00 via INTRAVENOUS

## 2017-05-17 MED ORDER — SODIUM CHLORIDE 0.9% FLUSH
3.0000 mL | Freq: Two times a day (BID) | INTRAVENOUS | Status: DC
  Administered 2017-05-17 – 2017-05-20 (×5): 3 mL via INTRAVENOUS

## 2017-05-17 MED ORDER — SODIUM CHLORIDE 0.9 % IV BOLUS (SEPSIS)
1000.0000 mL | Freq: Once | INTRAVENOUS | Status: AC
  Administered 2017-05-17: 1000 mL via INTRAVENOUS

## 2017-05-17 MED ORDER — ENOXAPARIN SODIUM 100 MG/ML ~~LOC~~ SOLN
85.0000 mg | Freq: Two times a day (BID) | SUBCUTANEOUS | Status: DC
  Administered 2017-05-17: 85 mg via SUBCUTANEOUS
  Filled 2017-05-17: qty 1

## 2017-05-17 MED ORDER — ACETAMINOPHEN 650 MG RE SUPP: 650.0000 mg | Freq: Four times a day (QID) | RECTAL | Status: DC | PRN

## 2017-05-17 MED ORDER — ACETAMINOPHEN 325 MG PO TABS
650.0000 mg | ORAL_TABLET | Freq: Four times a day (QID) | ORAL | Status: DC | PRN
  Administered 2017-05-21: 650 mg via ORAL
  Filled 2017-05-17: qty 2

## 2017-05-17 MED ORDER — ADULT MULTIVITAMIN W/MINERALS CH
1.0000 | ORAL_TABLET | Freq: Every evening | ORAL | Status: DC
  Administered 2017-05-18 – 2017-05-20 (×3): 1 via ORAL
  Filled 2017-05-17 (×5): qty 1

## 2017-05-17 MED ORDER — ATORVASTATIN CALCIUM 40 MG PO TABS
40.0000 mg | ORAL_TABLET | Freq: Every evening | ORAL | Status: DC
  Administered 2017-05-18 – 2017-05-20 (×3): 40 mg via ORAL
  Filled 2017-05-17 (×5): qty 1

## 2017-05-17 MED ORDER — PIPERACILLIN-TAZOBACTAM 3.375 G IVPB
3.3750 g | Freq: Two times a day (BID) | INTRAVENOUS | Status: DC
  Administered 2017-05-18 – 2017-05-20 (×5): 3.375 g via INTRAVENOUS
  Filled 2017-05-17 (×6): qty 50

## 2017-05-17 MED ORDER — LEVOTHYROXINE SODIUM 75 MCG PO TABS
75.0000 ug | ORAL_TABLET | Freq: Every day | ORAL | Status: DC
  Administered 2017-05-19 – 2017-05-20 (×2): 75 ug via ORAL
  Filled 2017-05-17 (×2): qty 1

## 2017-05-17 MED ORDER — PIPERACILLIN-TAZOBACTAM 3.375 G IVPB 30 MIN
3.3750 g | Freq: Once | INTRAVENOUS | Status: AC
  Administered 2017-05-17: 3.375 g via INTRAVENOUS
  Filled 2017-05-17: qty 50

## 2017-05-17 NOTE — ED Notes (Signed)
Urinal placed between pt's legs to attempt to collect urine sample. RN called [facility] for male in and out catheter kits as floor stock is out.

## 2017-05-17 NOTE — H&P (Signed)
History and Physical    Zachary [name] DQQ:[id number] DOB: [birth year] DOA: 05/17/2017  PCP: [name], MD  Patient coming from: SNF  Chief Complaint:   Fever.   HPI: Zachary [name] is an 71 y.o. male with hx of mental retardation, ESRD on HD, DM, HLD, HTN, recent total colectomy, disharged last week, brought back to the ER for fever.  He has chronic diarrhea, and that is not new.  Evaluation in the ER showed no leukocytosis, but he has elevated lactic to 3.15, and no hypotension.  CT of the abdomen showed inflammation at the anstomosis sites, no abscesses seen.  He has a CXR which showed no acute process.  He was given IV Zosyn.  EDP spoke with surgery, who will see him in consultation.  Hospitalist was asked to admit him for sepsis, with possible colitis. His UA was negative.    ED Course:  See above.  Rewiew of Systems:  Constitutional: Negative for malaise, fever and chills. No significant weight loss or weight gain Eyes: Negative for eye pain, redness and discharge, diplopia, visual changes, or flashes of light. ENMT: Negative for ear pain, hoarseness, nasal congestion, sinus pressure and sore throat. No headaches; tinnitus, drooling, or problem swallowing. Cardiovascular: Negative for chest pain, palpitations, diaphoresis, dyspnea and peripheral edema. ; No orthopnea, PND Respiratory: Negative for cough, hemoptysis, wheezing and stridor. No pleuritic chestpain. Gastrointestinal: Negative for diarrhea, constipation,  melena, blood in stool, hematemesis, jaundice and rectal bleeding.    Genitourinary: Negative for frequency, dysuria, incontinence,flank pain and hematuria; Musculoskeletal: Negative for back pain and neck pain. Negative for swelling and trauma.;  Skin: . Negative for pruritus, rash, abrasions, bruising and skin lesion.; ulcerations Neuro: Negative for headache, lightheadedness and neck stiffness. Negative for weakness, altered level of consciousness , altered mental  status, extremity weakness, burning feet, involuntary movement, seizure and syncope.  Psych: negative for anxiety, depression, insomnia, tearfulness, panic attacks, hallucinations, paranoia, suicidal or homicidal ideation   Past Medical History:  Diagnosis Date  . Abnormal CT scan, kidney 10/06/2011  . Acute pyelonephritis 10/07/2011  . Anemia    normocytic  . Anxiety    mental retardation  . Bladder wall thickening 10/06/2011  . BPH (benign prostatic hypertrophy)   . Diabetes mellitus   . Edema     history of lower extremity edema  . Heme positive stool   . Hydronephrosis   . Hyperkalemia   . Hyperlipidemia   . Hypernatremia   . Hypertension   . Hypothyroidism   . Impaired speech   . 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 MR (mental retardation)   . Obstructive uropathy   . 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 Poor historian poor historian  . Protein calorie malnutrition ([place])   . Pyelonephritis   . Renal failure (ARF), acute on chronic (HCC)   . Renal insufficiency    chronic history  . Smoking   . Uremia   . Urinary retention   . UTI (lower urinary tract infection) 10/06/2011  . UTI (lower urinary tract infection)     Past Surgical History:  Procedure Laterality Date  . AV FISTULA PLACEMENT Left 07/06/2015   Procedure:  INSERTION LEFT ARM ARTERIOVENOUS GORTEX GRAFT;  Surgeon: Angelia Mould, MD;  Location: [facility];  Service: Vascular;  Laterality: Left;  . AV FISTULA PLACEMENT Right 02/26/2016   Procedure: ARTERIOVENOUS (AV) FISTULA CREATION ;  Surgeon: Angelia Mould, MD;  Location: [facility];  Service: Vascular;  Laterality: Right;  .  [place] REMOVAL Left 10/09/2015   Procedure: REMOVAL OF ARTERIOVENOUS GORETEX GRAFT ([place]) Evacuation of Lymphocele, Vein Patch angioplasty of brachial artery.;  Surgeon: Angelia Mould, MD;  Location: [facility];  Service: Vascular;  Laterality: Left;  . BASCILIC VEIN TRANSPOSITION Right 02/26/2016   Procedure: Right BASCILIC  VEIN TRANSPOSITION;  Surgeon: Angelia Mould, MD;  Location: [facility];  Service: Vascular;  Laterality: Right;  . CIRCUMCISION N/A 01/04/2014   Procedure: CIRCUMCISION ADULT (procedure #1);  Surgeon: Marissa Nestle, MD;  [facility];  Service: Urology;  Laterality: N/A;  . COLECTOMY N/A 05/04/2017   Procedure: TOTAL COLECTOMY;  Surgeon: Aviva Signs, MD;  [facility]: [facility];  Service: General;  Laterality: N/A;  . COLONOSCOPY N/A 04/27/2017   Procedure: COLONOSCOPY;  Surgeon: Daneil Dolin, MD;  [facility];  Service: Endoscopy;  Laterality: N/A;  245  . CYSTOSCOPY W/ RETROGRADES Bilateral 06/29/2015   Procedure: CYSTOSCOPY, DILATION OF URETHRAL STRICTURE WITH BILATERAL RETROGRADE PYELOGRAM,SUPRAPUBIC TUBE CHANGE;  Surgeon: Festus Aloe, MD;  Location: [facility];  Service: Urology;  Laterality: Bilateral;  . CYSTOSCOPY WITH URETHRAL DILATATION N/A 12/29/2013   Procedure: CYSTOSCOPY WITH URETHRAL DILATATION;  Surgeon: Marissa Nestle, MD;  [facility];  Service: Urology;  Laterality: N/A;  . ESOPHAGOGASTRODUODENOSCOPY N/A 04/27/2017   Procedure: ESOPHAGOGASTRODUODENOSCOPY (EGD);  Surgeon: Daneil Dolin, MD;  [facility];  Service: Endoscopy;  Laterality: N/A;  . ORIF FEMUR FRACTURE Right 11/22/2016   Procedure: OPEN REDUCTION INTERNAL FIXATION (ORIF) DISTAL FEMUR FRACTURE;  Surgeon: Rod Can, MD;  Location: [facility];  Service: Orthopedics;  Laterality: Right;  . PERIPHERAL VASCULAR CATHETERIZATION N/A 10/08/2015   Procedure: A/V Shuntogram;  Surgeon: Angelia Mould, MD;  Location: [facility];  Service: Cardiovascular;  Laterality: N/A;  . TRANSURETHRAL RESECTION OF PROSTATE N/A 01/04/2014   Procedure: TRANSURETHRAL RESECTION OF THE PROSTATE (TURP) (procedure #2);  Surgeon: Marissa Nestle, MD;  Location[facility];  Service: Urology;  Laterality: N/A;     reports that he has never smoked. He has never used smokeless tobacco. He reports that he  does not drink alcohol or use drugs.  No Known Allergies  Family History  Problem Relation Age of Onset  . Cancer Mother   . Colon cancer Neg Hx      Prior to Admission medications   Medication Sig Start Date End Date Taking? Authorizing Provider  Amino Acids-Protein Hydrolys (FEEDING SUPPLEMENT, PRO-STAT 64,) LIQD Take 30 mLs by mouth 2 (two) times daily between meals.   Yes [provider]  atorvastatin (LIPITOR) 40 MG tablet Take 40 mg by mouth every evening.   Yes [provider]  enoxaparin (LOVENOX) 100 MG/ML injection Inject 85 mg into the skin every 12 (twelve) hours.   Yes [provider]  levothyroxine (SYNTHROID, LEVOTHROID) 75 MCG tablet Take 75 mcg by mouth daily before breakfast.    Yes [provider]  Multiple Vitamin (MULTIVITAMIN WITH MINERALS) TABS tablet Take 1 tablet by mouth every evening.    Yes [provider]  Neo-Bacit-Poly-Lidocaine (FIRST AID PLUS LIDOCAINE EX) Apply 1 application topically Every Tuesday,Thursday,and Saturday with dialysis. Prior to dialysis   Yes [provider]  omeprazole (PRILOSEC) 40 MG capsule Take 40 mg by mouth every evening.   Yes [provider]  tamsulosin (FLOMAX) 0.4 MG CAPS capsule Take 0.4 mg by mouth daily at 8 pm. Give 30 minutes after a meal   Yes [provider]  torsemide (DEMADEX)  10 MG tablet Take 5 tablets (50 mg total) by mouth daily. 04/07/16  Yes [name], MD    Physical Exam: Vitals:   05/17/17 1800 05/17/17 1830 05/17/17 1847 05/17/17 1900  BP: 110/61 123/65 123/65 129/76  Pulse: (!) 115 (!) 109 (!) 113   Resp:   18   Temp:      TempSrc:      SpO2: 97% 93% 94%   Weight:      Height:        Constitutional: NAD, calm, comfortable Vitals:   05/17/17 1800 05/17/17 1830 05/17/17 1847 05/17/17 1900  BP: 110/61 123/65 123/65 129/76  Pulse: (!) 115 (!) 109 (!) 113   Resp:   18   Temp:      TempSrc:      SpO2: 97% 93% 94%   Weight:       Height:       Eyes: PERRL, lids and conjunctivae normal ENMT: Mucous membranes are moist. Posterior pharynx clear of any exudate or lesions.Normal dentition.  Neck: normal, supple, no masses, no thyromegaly Respiratory: clear to auscultation bilaterally, no wheezing, no crackles. Normal respiratory effort. No accessory muscle use.  Cardiovascular: Regular rate and rhythm, no murmurs / rubs / gallops. No extremity edema. 2+ pedal pulses. No carotid bruits.  Abdomen: no tenderness, no masses palpated. No hepatosplenomegaly. Bowel sounds positive. Surgical site well healed.  Musculoskeletal: no clubbing / cyanosis. No joint deformity upper and lower extremities. Good ROM, no contractures. Normal muscle tone.  Skin: no rashes, lesions, ulcers. No induration Neurologic: CN 2-12 grossly intact. Sensation intact, DTR normal. Strength 5/5 in all 4.  Psychiatric: Normal judgment and insight. Alert and oriented x 3. Normal mood.    Labs on Admission: I have personally reviewed following labs and imaging studies  CBC:  Recent Labs Lab 05/12/17 1242 05/13/17 0752 05/17/17 1558  WBC 4.3 6.5 10.4  NEUTROABS  --   --  6.5  HGB 8.6* 9.7* 9.7*  HCT 25.9* 29.7* 30.6*  MCV 92.8 94.0 95.0  PLT 287 380 244   Basic Metabolic Panel:  Recent Labs Lab 05/12/17 1242 05/13/17 0752 05/17/17 1558  NA 137 137 139  K 3.4* 3.6 3.6  CL 99* 95* 102  CO2 25 25 24   GLUCOSE 114* 116* 131*  BUN 52* 60* 28*  CREATININE 4.73* 5.80* 4.49*  CALCIUM 7.6* 7.7* 7.6*  PHOS 4.0 5.8*  --    GFR: Estimated Creatinine Clearance: 17.3 mL/min (A) (by C-G formula based on SCr of 4.49 mg/dL (H)). Liver Function Tests:  Recent Labs Lab 05/12/17 1242 05/13/17 0752 05/17/17 1558  AST  --   --  36  ALT  --   --  17  ALKPHOS  --   --  102  BILITOT  --   --  1.0  PROT  --   --  5.9*  ALBUMIN 2.5* 2.7* 2.3*      Component Value Date/Time   COLORURINE AMBER (A) 12/26/2016 1243   APPEARANCEUR CLOUDY (A)  12/26/2016 1243   LABSPEC 1.020 12/26/2016 1243   PHURINE 5.5 12/26/2016 1243   GLUCOSEU NEGATIVE 12/26/2016 1243   HGBUR NEGATIVE 12/26/2016 1243   BILIRUBINUR SMALL (A) 12/26/2016 1243   KETONESUR TRACE (A) 12/26/2016 1243   PROTEINUR 30 (A) 12/26/2016 1243   UROBILINOGEN 0.2 07/20/2015 1850   NITRITE NEGATIVE 12/26/2016 1243   LEUKOCYTESUR MODERATE (A) 12/26/2016 1243    Radiological Exams on Admission: Ct Abdomen Pelvis Wo Contrast  Result Date:  05/17/2017 CLINICAL DATA:  Colectomy May 04, 2017. Diarrhea, fever, and vomiting today. No pain. EXAM: CT ABDOMEN AND PELVIS WITHOUT CONTRAST TECHNIQUE: Multidetector CT imaging of the abdomen and pelvis was performed following the standard protocol without IV contrast. COMPARISON:  May 09, 2017 FINDINGS: Lower chest: Coronary artery calcifications are identified. Dependent atelectasis is seen bilaterally without suspicious infiltrate, nodule, or mass. Hepatobiliary: No focal liver abnormality is seen. No gallstones, gallbladder wall thickening, or biliary dilatation. Pancreas: Unremarkable. No pancreatic ductal dilatation or surrounding inflammatory changes. Spleen: Normal in size without focal abnormality. Adrenals/Urinary Tract: Adrenal glands are normal. A left renal cyst is identified. There is a calcification in the wall of the left renal cyst. No renal obstruction. The bladder is thick walled and poorly distended. Stomach/Bowel: The patient is status post colectomy with an ilealcolic anastomosis in the pelvis. Mild gastric distention persists but is improved in the interval. The proximal small bowel is relatively decompressed. Small bowel loops in the left lateral abdomen on series 2, image 43 may be thick walled but are poorly distended or evaluated without oral contrast. No pneumatosis. More distally, the small bowel is distended measuring up to 4 cm in caliber. There appears to be rectal wall thickening distal to the anastomosis, best  appreciated on coronal image 79. The rectum is poorly distended however somewhat limiting evaluation. Mild thickening of the distal ileum, just proximal to the anastomosis persists is unchanged on axial image 77. Vascular/Lymphatic: Calcified atherosclerosis is associated with the non aneurysmal aorta and iliac vessels. No adenopathy. Reproductive: Prostate is unremarkable. Other: Subcutaneous gas is likely from recent injections or recent surgery. No associated fluid collection or soft tissue stranding. No free air identified. Ascites remains, particularly in the upper abdomen adjacent to the liver and spleen. Musculoskeletal: No acute bony abnormalities. IMPRESSION: 1. There appears to be wall thickening associated with the distal ileum, just proximal to the anastomosis in the pelvis. There also appears to be wall thickening associated with the rectum. A few loops of jejunum in in the lateral left abdomen may demonstrate mild wall thickening as well. These regions of wall thickening suggest an inflammatory or infectious process. The proximal small bowel is decompressed although the distal half of the small bowel is mildly dilated without a discrete site of obstruction identified. 2. Ascites.  Free air has resolved. 3. Atherosclerosis. Electronically Signed   By: Dorise Bullion III M.D   On: 05/17/2017 17:42   Dg Chest 1 View  Result Date: 05/17/2017 CLINICAL DATA:  Fever.  Sepsis. EXAM: CHEST 1 VIEW COMPARISON:  December 26, 2016 FINDINGS: The study is limited due to a low volume portable technique. No pneumothorax. No nodules or masses. Increased density in the right infrahilar region could represent vascular crowding but a superimposed subtle infiltrate is not excluded. A PA and lateral chest x-ray may better evaluate. No other acute abnormalities are identified. IMPRESSION: 1. Increased density in the right infrahilar region could represent vascular crowding versus a subtle superimposed infiltrate. A PA and  lateral chest x-ray may better evaluate. Electronically Signed   By: Dorise Bullion III M.D   On: 05/17/2017 17:01   Dg Chest 2 View  Result Date: 05/17/2017 CLINICAL DATA:  Fever.  Sepsis. EXAM: CHEST  2 VIEW COMPARISON:  Single [facility] view earlier this day. Lung bases from abdominal CT earlier this day. FINDINGS: Low lung volumes. Bibasilar atelectasis, including the right infrahilar opacity seen on prior exam. No confluent consolidation. Normal heart size and mediastinal contours. No  pulmonary edema. No pneumothorax or pleural effusion. No acute osseous abnormalities. IMPRESSION: Bibasilar atelectasis, including right infrahilar opacity on prior radiograph. No consolidation to suggest pneumonia. Low lung volumes persist. Electronically Signed   By: Jeb Levering M.D.   On: 05/17/2017 18:15    EKG: Independently reviewed.   Assessment/Plan Principal Problem:   Sepsis ([place]) Active Problems:   Diabetes mellitus ([place])   Mental retardation   End stage renal disease ([place])   Anemia in CKD (chronic kidney disease)   Colitis    PLAN:   Sepsis:  Recent abdominal surgery, and CT showed colitis.  No other source that is obvious.  Will continue with IV Zosyn.  Made NPO, and give gentle IVF.  Surgery has been consulted given recent abdominal surgery.  DM:  Will give D5NS given NPO.  Follow BS and give sensitive SSI.  ESRD:  Please consult nephrology tomorrow for dialysis support.    Anemia:  Stable.    Diarrhea:  Chronic.  Doubt C diff.   DVT prophylaxis: lovenox.  Code Status: FULL CODE.  Family Communication: None.  Disposition Plan:  SNF when stable.  Consults called: Surgery. Admission status: Inpatient.    Surya Folden MD FACP. [facility]  If 7PM-7AM, please contact night-coverage [email or web address] Password [email or web address]  05/17/2017, 8:20 PM

## 2017-05-17 NOTE — ED Triage Notes (Signed)
Pt sent from [facility].  Staff reports pt had colectomy on July 9th and returned to [facility] Friday.  Pt has had diarrhea, fever, and vomiting today.  Pt had tylenol at approx 1455.  Pt alert and oriented at this time.  Denies pain.  Pt says he had dialysis Saturday.

## 2017-05-17 NOTE — ED Notes (Addendum)
Pt update pt brother Manus Weedman [phone] with patient disposition and room number.

## 2017-05-17 NOTE — Progress Notes (Signed)
Pharmacy Antibiotic Note  Zachary [name] is a 71 y.o. male admitted on 05/17/2017 with sepsis.  Pharmacy has been consulted for zosyn dosing.  Plan: Zosyn 3.375 gm IV q12 hours F/u cultures and clinical course  Height: 6' (182.9 cm) Weight: 191 lb (86.6 kg) IBW/kg (Calculated) : 77.6  Temp (24hrs), Avg:101.3 F (38.5 C), Min:100.2 F (37.9 C), Max:102.3 F (39.1 C)   Recent Labs Lab 05/12/17 1242 05/13/17 0752 05/17/17 1557 05/17/17 1558 05/17/17 2123  WBC 4.3 6.5  --  10.4  --   CREATININE 4.73* 5.80*  --  4.49*  --   LATICACIDVEN  --   --  3.15*  --  1.45    Estimated Creatinine Clearance: 17.3 mL/min (A) (by C-G formula based on SCr of 4.49 mg/dL (H)).    No Known Allergies   Thank you for allowing pharmacy to be a part of this patient's care.  Beverlee Nims 05/17/2017 9:37 PM

## 2017-05-17 NOTE — ED Provider Notes (Signed)
[facility] DEPT Provider Note   CSN: [id number] Arrival date & time: 05/17/17  1514     History   Chief Complaint Chief Complaint  Patient presents with  . Fever  . Diarrhea    HPI Zachary [name] is a 71 y.o. male with a past medical history significant for Mental retardation (MR), ESRD, HTN, hypothyroidism, CHF, HLD and recent colectomy on 7/9 who present with fever, diarrhea and abdominal pain. Patient was recently discharged (7/20) from the hospital following colectomy and returned to [facility] (nursing home). Patient reports multiple episodes of NBNB diarrhea and emesis on 7/21. Patient had HD yesterday and tolerated the session well. Patient has been able to tolerate po. Patient had a fever this morning at the [facility] and receive Tylenol for it and was sent to [facility] ED. Patient has MR so it is difficult at time to obtain an accurate history. Patient denies any CP, SOB, cough, HA, dizziness, vision changes.  HPI  Past Medical History:  Diagnosis Date  . Abnormal CT scan, kidney 10/06/2011  . Acute pyelonephritis 10/07/2011  . Anemia    normocytic  . Anxiety    mental retardation  . Bladder wall thickening 10/06/2011  . BPH (benign prostatic hypertrophy)   . Diabetes mellitus   . Edema     history of lower extremity edema  . Heme positive stool   . Hydronephrosis   . Hyperkalemia   . Hyperlipidemia   . Hypernatremia   . Hypertension   . Hypothyroidism   . Impaired speech   . 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 MR (mental retardation)   . Obstructive uropathy   . 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 Poor historian poor historian  . Protein calorie malnutrition ([place])   . Pyelonephritis   . Renal failure (ARF), acute on chronic (HCC)   . Renal insufficiency    chronic history  . Smoking   . Uremia   . Urinary retention   . UTI (lower urinary tract infection) 10/06/2011  . UTI (lower urinary tract infection)     Patient Active Problem  List   Diagnosis Date Noted  . Dysplastic polyp of colon   . GI bleed 05/02/2017  . Acute blood loss anemia 05/01/2017  . ESRD (end stage renal disease) ([place]) 05/01/2017  . Anemia in CKD (chronic kidney disease) 05/01/2017  . Colonoscopy causing post-procedural bleeding   . Colonic mass   . Lower GI bleed   . Chronic anticoagulation 02/18/2017  . Anticoagulation management encounter 12/29/2016  . DVT of leg (deep venous thrombosis) ([place]) 12/25/2016  . Postoperative anemia due to acute blood loss   . Closed comminuted intra-articular fracture of distal femur, right, initial encounter ([place]) 11/22/2016  . Right femoral fracture ([place]) 11/20/2016  . Hydronephrosis, bilateral 03/10/2016  . End stage renal disease ([place]) 10/08/2015  . 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 Heme positive stool   . Abnormal CT scan, esophagus   . Protein-calorie malnutrition, severe ([place]) 07/10/2015  . Essential hypertension 07/08/2015  . Hyperlipidemia 07/08/2015  . Obstructive uropathy 04/17/2015  . Bladder neck contracture   . Anemia, chronic disease 04/16/2015  . Benign prostatic hyperplasia with urinary retention 03/03/2015  . Mental retardation 10/10/2011  . Acute pyelonephritis 10/07/2011  . 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 Bladder wall thickening 10/06/2011  . Iron deficiency anemia 10/02/2011  . Diabetes mellitus ([place]) 10/01/2011  . FX CLOSED FIBULA NOS 02/10/2008    Past Surgical History:  Procedure Laterality Date  . AV FISTULA PLACEMENT Left 07/06/2015   Procedure:  INSERTION LEFT ARM ARTERIOVENOUS GORTEX GRAFT;  Surgeon: Angelia Mould, MD;  Location: [facility];  Service: Vascular;  Laterality: Left;  . AV FISTULA PLACEMENT Right 02/26/2016   Procedure: ARTERIOVENOUS (AV) FISTULA CREATION ;  Surgeon: Angelia Mould, MD;  Location: [facility];  Service: Vascular;  Laterality: Right;  . [place] REMOVAL Left 10/09/2015   Procedure: REMOVAL OF ARTERIOVENOUS GORETEX GRAFT  ([place]) Evacuation of Lymphocele, Vein Patch angioplasty of brachial artery.;  Surgeon: Angelia Mould, MD;  Location: [facility];  Service: Vascular;  Laterality: Left;  . BASCILIC VEIN TRANSPOSITION Right 02/26/2016   Procedure: Right BASCILIC VEIN TRANSPOSITION;  Surgeon: Angelia Mould, MD;  Location: [facility];  Service: Vascular;  Laterality: Right;  . CIRCUMCISION N/A 01/04/2014   Procedure: CIRCUMCISION ADULT (procedure #1);  Surgeon: Marissa Nestle, MD;  Location: [facility];  Service: Urology;  Laterality: N/A;  . COLECTOMY N/A 05/04/2017   Procedure: TOTAL COLECTOMY;  Surgeon: Aviva Signs, MD;  Location: [facility];  Service: General;  Laterality: N/A;  . COLONOSCOPY N/A 04/27/2017   Procedure: COLONOSCOPY;  Surgeon: Daneil Dolin, MD;  [facility];  Service: Endoscopy;  Laterality: N/A;  245  . CYSTOSCOPY W/ RETROGRADES Bilateral 06/29/2015   Procedure: CYSTOSCOPY, DILATION OF URETHRAL STRICTURE WITH BILATERAL RETROGRADE PYELOGRAM,SUPRAPUBIC TUBE CHANGE;  Surgeon: Festus Aloe, MD;  Location: [facility];  Service: Urology;  Laterality: Bilateral;  . CYSTOSCOPY WITH URETHRAL DILATATION N/A 12/29/2013   Procedure: CYSTOSCOPY WITH URETHRAL DILATATION;  Surgeon: Marissa Nestle, MD;  Location: [facility];  Service: Urology;  Laterality: N/A;  . ESOPHAGOGASTRODUODENOSCOPY N/A 04/27/2017   Procedure: ESOPHAGOGASTRODUODENOSCOPY (EGD);  Surgeon: Daneil Dolin, MD;  [facility];  Service: Endoscopy;  Laterality: N/A;  . ORIF FEMUR FRACTURE Right 11/22/2016   Procedure: OPEN REDUCTION INTERNAL FIXATION (ORIF) DISTAL FEMUR FRACTURE;  Surgeon: Rod Can, MD;  Location: [facility];  Service: Orthopedics;  Laterality: Right;  . PERIPHERAL VASCULAR CATHETERIZATION N/A 10/08/2015   Procedure: A/V Shuntogram;  Surgeon: Angelia Mould, MD;  Location: [facility];  Service: Cardiovascular;  Laterality: N/A;  . TRANSURETHRAL RESECTION OF PROSTATE N/A 01/04/2014   Procedure:  TRANSURETHRAL RESECTION OF THE PROSTATE (TURP) (procedure #2);  Surgeon: Marissa Nestle, MD;  Location: [facility];  Service: Urology;  Laterality: N/A;       Home Medications    Prior to Admission medications   Medication Sig Start Date End Date Taking? Authorizing Provider  Amino Acids-Protein Hydrolys (FEEDING SUPPLEMENT, PRO-STAT 64,) LIQD Take 30 mLs by mouth 2 (two) times daily between meals.   Yes [provider]  atorvastatin (LIPITOR) 40 MG tablet Take 40 mg by mouth every evening.   Yes [provider]  enoxaparin (LOVENOX) 100 MG/ML injection Inject 85 mg into the skin every 12 (twelve) hours.   Yes [provider]  levothyroxine (SYNTHROID, LEVOTHROID) 75 MCG tablet Take 75 mcg by mouth daily before breakfast.    Yes [provider]  Multiple Vitamin (MULTIVITAMIN WITH MINERALS) TABS tablet Take 1 tablet by mouth every evening.    Yes [provider]  Neo-Bacit-Poly-Lidocaine (FIRST AID PLUS LIDOCAINE EX) Apply 1 application topically Every Tuesday,Thursday,and Saturday with dialysis. Prior to dialysis   Yes [provider]  omeprazole (PRILOSEC) 40 MG capsule Take 40 mg by mouth every evening.   Yes [provider]  tamsulosin (FLOMAX) 0.4 MG CAPS capsule  Take 0.4 mg by mouth daily at 8 pm. Give 30 minutes after a meal   Yes [provider]  torsemide (DEMADEX) 10 MG tablet Take 5 tablets (50 mg total) by mouth daily. 04/07/16  Yes [name], MD    Family History Family History  Problem Relation Age of Onset  . Cancer Mother   . Colon cancer Neg Hx     Social History Social History  Substance Use Topics  . Smoking status: Never Smoker  . Smokeless tobacco: Never Used  . Alcohol use No     Comment: occ. use      Allergies   Patient has no known allergies.   Review of Systems Review of Systems  Constitutional: Positive for fever.  HENT: Negative.   Eyes: Negative.   Respiratory:  Negative.   Cardiovascular: Negative.   Gastrointestinal: Positive for abdominal distention, abdominal pain, diarrhea and vomiting.  Endocrine: Negative.   Genitourinary: Negative.   Musculoskeletal: Negative.   Skin: Negative.   Allergic/Immunologic: Negative.   Neurological: Negative.   Hematological: Negative.   Psychiatric/Behavioral: Negative.     Physical Exam Updated Vital Signs BP 123/65   Pulse (!) 113   Temp 100.2 F (37.9 C) (Oral)   Resp 18   Ht 6' (1.829 m)   Wt 86.6 kg (191 lb)   SpO2 94%   BMI 25.90 kg/m   Physical Exam  Constitutional: He is oriented to person, place, and time. He appears well-developed.  HENT:  Head: Normocephalic and atraumatic.  Eyes: Pupils are equal, round, and reactive to light. EOM are normal.  Neck: Normal range of motion. Neck supple.  Cardiovascular: Normal rate and regular rhythm.   Pulmonary/Chest: Effort normal and breath sounds normal. No respiratory distress. He has no wheezes.  Abdominal: Soft. Bowel sounds are normal.  Mild diffused tenderness in all 4 quadrants, increase sensitivity around midline vertical incision s/p colectomy. No drainage noted. Suture are removed and wound is healing appropriately. Mild distention, no fluid wave or signs of ascites  Musculoskeletal: Normal range of motion.  Neurological: He is alert and oriented to person, place, and time.  Psychiatric: He has a normal mood and affect. His behavior is normal.     ED Treatments / Results  Labs (all labs ordered are listed, but only abnormal results are displayed) Labs Reviewed  COMPREHENSIVE METABOLIC PANEL - Abnormal; Notable for the following:       Result Value   Glucose, Bld 131 (*)    BUN 28 (*)    Creatinine, Ser 4.49 (*)    Calcium 7.6 (*)    Total Protein 5.9 (*)    Albumin 2.3 (*)    GFR calc non Af Amer 12 (*)    GFR calc Af Amer 14 (*)    All other components within normal limits  CBC WITH DIFFERENTIAL/PLATELET - Abnormal;  Notable for the following:    RBC 3.22 (*)    Hemoglobin 9.7 (*)    HCT 30.6 (*)    RDW 17.2 (*)    Monocytes Absolute 1.5 (*)    All other components within normal limits  I-STAT CG4 LACTIC ACID, ED - Abnormal; Notable for the following:    Lactic Acid, Venous 3.15 (*)    All other components within normal limits  CULTURE, BLOOD (ROUTINE X 2)  CULTURE, BLOOD (ROUTINE X 2)  C DIFFICILE QUICK SCREEN W PCR REFLEX  URINALYSIS, ROUTINE W REFLEX MICROSCOPIC  I-STAT CG4 LACTIC ACID, ED  EKG  EKG Interpretation  Date/Time:  Sunday May 17 2017 15:19:38 EDT Ventricular Rate:  120 PR Interval:    QRS Duration: 92 QT Interval:  320 QTC Calculation: 453 R Axis:   32 Text Interpretation:  Sinus tachycardia Baseline wander in lead(s) V2 Confirmed by Elnora Morrison [phone]) on 05/17/2017 3:54:28 PM       Radiology Ct Abdomen Pelvis Wo Contrast  Result Date: 05/17/2017 CLINICAL DATA:  Colectomy May 04, 2017. Diarrhea, fever, and vomiting today. No pain. EXAM: CT ABDOMEN AND PELVIS WITHOUT CONTRAST TECHNIQUE: Multidetector CT imaging of the abdomen and pelvis was performed following the standard protocol without IV contrast. COMPARISON:  May 09, 2017 FINDINGS: Lower chest: Coronary artery calcifications are identified. Dependent atelectasis is seen bilaterally without suspicious infiltrate, nodule, or mass. Hepatobiliary: No focal liver abnormality is seen. No gallstones, gallbladder wall thickening, or biliary dilatation. Pancreas: Unremarkable. No pancreatic ductal dilatation or surrounding inflammatory changes. Spleen: Normal in size without focal abnormality. Adrenals/Urinary Tract: Adrenal glands are normal. A left renal cyst is identified. There is a calcification in the wall of the left renal cyst. No renal obstruction. The bladder is thick walled and poorly distended. Stomach/Bowel: The patient is status post colectomy with an ilealcolic anastomosis in the pelvis. Mild gastric  distention persists but is improved in the interval. The proximal small bowel is relatively decompressed. Small bowel loops in the left lateral abdomen on series 2, image 43 may be thick walled but are poorly distended or evaluated without oral contrast. No pneumatosis. More distally, the small bowel is distended measuring up to 4 cm in caliber. There appears to be rectal wall thickening distal to the anastomosis, best appreciated on coronal image 79. The rectum is poorly distended however somewhat limiting evaluation. Mild thickening of the distal ileum, just proximal to the anastomosis persists is unchanged on axial image 77. Vascular/Lymphatic: Calcified atherosclerosis is associated with the non aneurysmal aorta and iliac vessels. No adenopathy. Reproductive: Prostate is unremarkable. Other: Subcutaneous gas is likely from recent injections or recent surgery. No associated fluid collection or soft tissue stranding. No free air identified. Ascites remains, particularly in the upper abdomen adjacent to the liver and spleen. Musculoskeletal: No acute bony abnormalities. IMPRESSION: 1. There appears to be wall thickening associated with the distal ileum, just proximal to the anastomosis in the pelvis. There also appears to be wall thickening associated with the rectum. A few loops of jejunum in in the lateral left abdomen may demonstrate mild wall thickening as well. These regions of wall thickening suggest an inflammatory or infectious process. The proximal small bowel is decompressed although the distal half of the small bowel is mildly dilated without a discrete site of obstruction identified. 2. Ascites.  Free air has resolved. 3. Atherosclerosis. Electronically Signed   By: Dorise Bullion III M.D   On: 05/17/2017 17:42   Dg Chest 1 View  Result Date: 05/17/2017 CLINICAL DATA:  Fever.  Sepsis. EXAM: CHEST 1 VIEW COMPARISON:  December 26, 2016 FINDINGS: The study is limited due to a low volume portable  technique. No pneumothorax. No nodules or masses. Increased density in the right infrahilar region could represent vascular crowding but a superimposed subtle infiltrate is not excluded. A PA and lateral chest x-ray may better evaluate. No other acute abnormalities are identified. IMPRESSION: 1. Increased density in the right infrahilar region could represent vascular crowding versus a subtle superimposed infiltrate. A PA and lateral chest x-ray may better evaluate. Electronically Signed   By: Shanon Brow  Mee Hives M.D   On: 05/17/2017 17:01   Dg Chest 2 View  Result Date: 05/17/2017 CLINICAL DATA:  Fever.  Sepsis. EXAM: CHEST  2 VIEW COMPARISON:  Single AP view earlier this day. Lung bases from abdominal CT earlier this day. FINDINGS: Low lung volumes. Bibasilar atelectasis, including the right infrahilar opacity seen on prior exam. No confluent consolidation. Normal heart size and mediastinal contours. No pulmonary edema. No pneumothorax or pleural effusion. No acute osseous abnormalities. IMPRESSION: Bibasilar atelectasis, including right infrahilar opacity on prior radiograph. No consolidation to suggest pneumonia. Low lung volumes persist. Electronically Signed   By: Jeb Levering M.D.   On: 05/17/2017 18:15    Procedures Procedures (including critical care time)  Medications Ordered in ED Medications  piperacillin-tazobactam (ZOSYN) IVPB 3.375 g (not administered)  sodium chloride 0.9 % bolus 1,000 mL (1,000 mLs Intravenous New Bag/Given 05/17/17 1615)  piperacillin-tazobactam (ZOSYN) IVPB 3.375 g (0 g Intravenous Stopped 05/17/17 1812)     Initial Impression / Assessment and Plan / ED Course  I have reviewed the triage vital signs and the nursing notes.  Pertinent labs & imaging results that were available during my care of the patient were reviewed by me and considered in my medical decision making (see chart for details).   Pateint is 71 yo male recently discharged s/p colectomy who  presents with fever, diarrhea and emesis. On presentation patient is febrile and tachycardic meeting SIRS criteria. Given recent abdominal surgery, concern about possible sepsis. Sepsis protocol initiated. Patient received 1L NS bolus. EKG showed sinus tachycardia with an initial BP of 128/62. CXR was suspicious for possible right infra hilar infiltrate concerning for PNA, repeat CXR (2 views) was less concerning. Initial lactic acid is 3.15. Patient with no leukocytosis, WBC 10.4 and stable hemoglobin. Blood cultures were collected prior to starting patient on Zosyn for possible intra abdominal infection. CT findings with small intestine wall thickening suspicious for possible infectious vs inflammatory process, minimal concern for ileus. Will consult surgery for admission given recent colectomy, SIRS criteria and elevated lactate.  Surgery will consult patient once admitted. Team have a low suspicion  for intraabdominal infection even with recent surgery and initial presentation. Recommend consulting hospitalist service for admission.  [facility] hospitalist consulted, patient will be admitted for sepsis rule out and IV antibiotic while waiting on results of Blood cx.   Final Clinical Impressions(s) / ED Diagnoses   Final diagnoses:  Diarrhea  Fever    New Prescriptions New Prescriptions   No medications on file     Marjie Skiff, MD 05/17/17 2047    Marjie Skiff, MD 05/17/17 4403    Elnora Morrison, MD 05/17/17 2322

## 2017-05-17 NOTE — ED Notes (Signed)
Pt attempted to swallow oral medication. Pt began coughing and stated he did not want to take the oral medication. Pts HR rose to around 150. MD notified.

## 2017-05-18 ENCOUNTER — Encounter (HOSPITAL_COMMUNITY): Payer: Self-pay

## 2017-05-18 LAB — URINALYSIS, ROUTINE W REFLEX MICROSCOPIC
Glucose, UA: NEGATIVE mg/dL
Hgb urine dipstick: NEGATIVE
Leukocytes, UA: NEGATIVE
Nitrite: NEGATIVE
Protein, ur: 30 mg/dL — AB
Specific Gravity, Urine: >1.03 — ABNORMAL HIGH (ref 1.005–1.030)
pH: 5 (ref 5.0–8.0)

## 2017-05-18 LAB — COMPREHENSIVE METABOLIC PANEL
ALT: 15 U/L — ABNORMAL LOW (ref 17–63)
AST: 29 U/L (ref 15–41)
Albumin: 2 g/dL — ABNORMAL LOW (ref 3.5–5.0)
Alkaline Phosphatase: 92 U/L (ref 38–126)
Anion gap: 10 (ref 5–15)
BUN: 35 mg/dL — ABNORMAL HIGH (ref 6–20)
CO2: 25 mmol/L (ref 22–32)
Calcium: 7.6 mg/dL — ABNORMAL LOW (ref 8.9–10.3)
Chloride: 102 mmol/L (ref 101–111)
Creatinine, Ser: 5.35 mg/dL — ABNORMAL HIGH (ref 0.61–1.24)
GFR calc Af Amer: 11 mL/min — ABNORMAL LOW (ref >60)
GFR calc non Af Amer: 10 mL/min — ABNORMAL LOW (ref >60)
Glucose, Bld: 145 mg/dL — ABNORMAL HIGH (ref 65–99)
Potassium: 3.4 mmol/L — ABNORMAL LOW (ref 3.5–5.1)
Sodium: 137 mmol/L (ref 135–145)
Total Bilirubin: 0.9 mg/dL (ref 0.3–1.2)
Total Protein: 5.3 g/dL — ABNORMAL LOW (ref 6.5–8.1)

## 2017-05-18 LAB — C DIFFICILE QUICK SCREEN W PCR REFLEX
C Diff antigen: NEGATIVE
C Diff interpretation: NOT DETECTED
C Diff toxin: NEGATIVE

## 2017-05-18 LAB — CBC
HCT: 26.8 % — ABNORMAL LOW (ref 39.0–52.0)
Hemoglobin: 8.5 g/dL — ABNORMAL LOW (ref 13.0–17.0)
MCH: 30.1 pg (ref 26.0–34.0)
MCHC: 31.7 g/dL (ref 30.0–36.0)
MCV: 95 fL (ref 78.0–100.0)
Platelets: 306 10*3/uL (ref 150–400)
RBC: 2.82 MIL/uL — ABNORMAL LOW (ref 4.22–5.81)
RDW: 17.1 % — ABNORMAL HIGH (ref 11.5–15.5)
WBC: 10.6 10*3/uL — ABNORMAL HIGH (ref 4.0–10.5)

## 2017-05-18 LAB — URINALYSIS, MICROSCOPIC (REFLEX)
RBC / HPF: NONE SEEN RBC/hpf (ref 0–5)
Squamous Epithelial / LPF: NONE SEEN

## 2017-05-18 MED ORDER — ENOXAPARIN SODIUM 100 MG/ML ~~LOC~~ SOLN
85.0000 mg | SUBCUTANEOUS | Status: DC
  Administered 2017-05-18 – 2017-05-20 (×3): 85 mg via SUBCUTANEOUS
  Filled 2017-05-18 (×3): qty 1

## 2017-05-18 NOTE — Consult Note (Signed)
Reason for Consult: Fever of unknown origin, status post near total colectomy Referring Physician: Dr. Legrand Rams    HPI: Patient is a 71 year old black male status post near total colectomy for adenocarcinoma of the colon 2 weeks ago who was referred back to [facility] with fever and low blood pressure, having been discharged just 3 days ago. Review of systems is somewhat limited due to patient's mental retardation. He denies any abdominal pain. He has been having multiple bowel movements. He is hungry.   Past Medical History:  Diagnosis Date  . Abnormal CT scan, kidney 10/06/2011  . Acute pyelonephritis 10/07/2011  . Anemia    normocytic  . Anxiety    mental retardation  . Bladder wall thickening 10/06/2011  . BPH (benign prostatic hypertrophy)   . Diabetes mellitus   . Edema     history of lower extremity edema  . Heme positive stool   . Hydronephrosis   . Hyperkalemia   . Hyperlipidemia   . Hypernatremia   . Hypertension   . Hypothyroidism   . Impaired speech   . 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 MR (mental retardation)   . Obstructive uropathy   . 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 Poor historian poor historian  . Protein calorie malnutrition ([place])   . Pyelonephritis   . Renal failure (ARF), acute on chronic (HCC)   . Renal insufficiency    chronic history  . Smoking   . Uremia   . Urinary retention   . UTI (lower urinary tract infection) 10/06/2011  . UTI (lower urinary tract infection)     Past Surgical History:  Procedure Laterality Date  . AV FISTULA PLACEMENT Left 07/06/2015   Procedure:  INSERTION LEFT ARM ARTERIOVENOUS GORTEX GRAFT;  Surgeon: Angelia Mould, MD;  Location: [place];  Service: Vascular;  Laterality: Left;  . AV FISTULA PLACEMENT Right 02/26/2016   Procedure: ARTERIOVENOUS (AV) FISTULA CREATION ;  Surgeon: Angelia Mould, MD;  Location: [place];  Service: Vascular;  Laterality: Right;  . [place] REMOVAL Left 10/09/2015    Procedure: REMOVAL OF ARTERIOVENOUS GORETEX GRAFT ([place]) Evacuation of Lymphocele, Vein Patch angioplasty of brachial artery.;  Surgeon: Angelia Mould, MD;  Location: [place];  Service: Vascular;  Laterality: Left;  . BASCILIC VEIN TRANSPOSITION Right 02/26/2016   Procedure: Right BASCILIC VEIN TRANSPOSITION;  Surgeon: Angelia Mould, MD;  Location: [place];  Service: Vascular;  Laterality: Right;  . CIRCUMCISION N/A 01/04/2014   Procedure: CIRCUMCISION ADULT (procedure #1);  Surgeon: Marissa Nestle, MD;  [facility];  Service: Urology;  Laterality: N/A;  . COLECTOMY N/A 05/04/2017   Procedure: TOTAL COLECTOMY;  Surgeon: Aviva Signs, MD;  [facility];  Service: General;  Laterality: N/A;  . COLONOSCOPY N/A 04/27/2017   Procedure: COLONOSCOPY;  Surgeon: Daneil Dolin, MD;  [facility];  Service: Endoscopy;  Laterality: N/A;  245  . CYSTOSCOPY W/ RETROGRADES Bilateral 06/29/2015   Procedure: CYSTOSCOPY, DILATION OF URETHRAL STRICTURE WITH BILATERAL RETROGRADE PYELOGRAM,SUPRAPUBIC TUBE CHANGE;  Surgeon: Festus Aloe, MD;  [facility];  Service: Urology;  Laterality: Bilateral;  . CYSTOSCOPY WITH URETHRAL DILATATION N/A 12/29/2013   Procedure: CYSTOSCOPY WITH URETHRAL DILATATION;  Surgeon: Marissa Nestle, MD;  [facility];  Service: Urology;  Laterality: N/A;  . ESOPHAGOGASTRODUODENOSCOPY N/A 04/27/2017   Procedure: ESOPHAGOGASTRODUODENOSCOPY (EGD);  Surgeon: Daneil Dolin, MD;  [facility];  Service: Endoscopy;  Laterality: N/A;  . ORIF FEMUR FRACTURE  Right 11/22/2016   Procedure: OPEN REDUCTION INTERNAL FIXATION (ORIF) DISTAL FEMUR FRACTURE;  Surgeon: Rod Can, MD;  Location: [place];  Service: Orthopedics;  Laterality: Right;  . PERIPHERAL VASCULAR CATHETERIZATION N/A 10/08/2015   Procedure: A/V Shuntogram;  Surgeon: Angelia Mould, MD;  Location: [place] [facility];  Service: Cardiovascular;  Laterality: N/A;  . TRANSURETHRAL  RESECTION OF PROSTATE N/A 01/04/2014   Procedure: TRANSURETHRAL RESECTION OF THE PROSTATE (TURP) (procedure #2);  Surgeon: Marissa Nestle, MD;  [facility];  Service: Urology;  Laterality: N/A;    Family History  Problem Relation Age of Onset  . Cancer Mother   . Colon cancer Neg Hx     Social History:  reports that he has never smoked. He has never used smokeless tobacco. He reports that he does not drink alcohol or use drugs.  Allergies: No Known Allergies  Medications:  Scheduled: . atorvastatin  40 mg Oral QPM  . enoxaparin  85 mg Subcutaneous Q12H  . levothyroxine  75 mcg Oral QAC breakfast  . multivitamin with minerals  1 tablet Oral QPM  . pantoprazole  40 mg Oral Daily  . sodium chloride flush  3 mL Intravenous Q12H  . tamsulosin  0.4 mg Oral Q2000    Results for orders placed or performed during the hospital encounter of 05/17/17 (from the past 48 hour(s))  I-Stat CG4 Lactic Acid, ED  (not at  [facility])     Status: Abnormal   Collection Time: 05/17/17  3:57 PM  Result Value Ref Range   Lactic Acid, Venous 3.15 (HH) 0.5 - 1.9 mmol/L  Comprehensive metabolic panel     Status: Abnormal   Collection Time: 05/17/17  3:58 PM  Result Value Ref Range   Sodium 139 135 - 145 mmol/L   Potassium 3.6 3.5 - 5.1 mmol/L    Comment: SLIGHT HEMOLYSIS   Chloride 102 101 - 111 mmol/L   CO2 24 22 - 32 mmol/L   Glucose, Bld 131 (H) 65 - 99 mg/dL   BUN 28 (H) 6 - 20 mg/dL   Creatinine, Ser 4.49 (H) 0.61 - 1.24 mg/dL   Calcium 7.6 (L) 8.9 - 10.3 mg/dL   Total Protein 5.9 (L) 6.5 - 8.1 g/dL   Albumin 2.3 (L) 3.5 - 5.0 g/dL   AST 36 15 - 41 U/L   ALT 17 17 - 63 U/L   Alkaline Phosphatase 102 38 - 126 U/L   Total Bilirubin 1.0 0.3 - 1.2 mg/dL   GFR calc non Af Amer 12 (L) >60 mL/min   GFR calc Af Amer 14 (L) >60 mL/min    Comment: (NOTE) The eGFR has been calculated using the CKD EPI equation. This calculation has not been validated in all clinical situations. eGFR's  persistently <60 mL/min signify possible Chronic Kidney Disease.    Anion gap 13 5 - 15  CBC WITH DIFFERENTIAL     Status: Abnormal   Collection Time: 05/17/17  3:58 PM  Result Value Ref Range   WBC 10.4 4.0 - 10.5 K/uL   RBC 3.22 (L) 4.22 - 5.81 MIL/uL   Hemoglobin 9.7 (L) 13.0 - 17.0 g/dL   HCT 30.6 (L) 39.0 - 52.0 %   MCV 95.0 78.0 - 100.0 fL   MCH 30.1 26.0 - 34.0 pg   MCHC 31.7 30.0 - 36.0 g/dL   RDW 17.2 (H) 11.5 - 15.5 %   Platelets 360 150 - 400 K/uL   Neutrophils Relative % 63 %  Lymphocytes Relative 21 %   Monocytes Relative 14 %   Eosinophils Relative 1 %   Basophils Relative 1 %   Neutro Abs 6.5 1.7 - 7.7 K/uL   Lymphs Abs 2.2 0.7 - 4.0 K/uL   Monocytes Absolute 1.5 (H) 0.1 - 1.0 K/uL   Eosinophils Absolute 0.1 0.0 - 0.7 K/uL   Basophils Absolute 0.1 0.0 - 0.1 K/uL   RBC Morphology POLYCHROMASIA PRESENT    WBC Morphology WHITE COUNT CONFIRMED ON SMEAR     Comment: ATYPICAL LYMPHOCYTES MILD LEFT SHIFT (1-5% METAS, OCC MYELO, OCC BANDS)   I-Stat CG4 Lactic Acid, ED  (not at  [facility])     Status: None   Collection Time: 05/17/17  9:23 PM  Result Value Ref Range   Lactic Acid, Venous 1.45 0.5 - 1.9 mmol/L  Urinalysis, Routine w reflex microscopic     Status: Abnormal   Collection Time: 05/18/17  3:00 AM  Result Value Ref Range   Color, Urine BROWN (A) YELLOW    Comment: BIOCHEMICALS MAY BE AFFECTED BY COLOR   APPearance CLOUDY (A) CLEAR   Specific Gravity, Urine >1.030 (H) 1.005 - 1.030   pH 5.0 5.0 - 8.0   Glucose, UA NEGATIVE NEGATIVE mg/dL   Hgb urine dipstick NEGATIVE NEGATIVE   Bilirubin Urine SMALL (A) NEGATIVE   Ketones, ur TRACE (A) NEGATIVE mg/dL   Protein, ur 30 (A) NEGATIVE mg/dL   Nitrite NEGATIVE NEGATIVE   Leukocytes, UA NEGATIVE NEGATIVE  Urinalysis, Microscopic (reflex)     Status: Abnormal   Collection Time: 05/18/17  3:00 AM  Result Value Ref Range   RBC / HPF NONE SEEN 0 - 5 RBC/hpf   WBC, UA 0-5 0 - 5 WBC/hpf   Bacteria, UA MANY (A)  NONE SEEN   Squamous Epithelial / LPF NONE SEEN NONE SEEN    Ct Abdomen Pelvis Wo Contrast  Result Date: 05/17/2017 CLINICAL DATA:  Colectomy May 04, 2017. Diarrhea, fever, and vomiting today. No pain. EXAM: CT ABDOMEN AND PELVIS WITHOUT CONTRAST TECHNIQUE: Multidetector CT imaging of the abdomen and pelvis was performed following the standard protocol without IV contrast. COMPARISON:  May 09, 2017 FINDINGS: Lower chest: Coronary artery calcifications are identified. Dependent atelectasis is seen bilaterally without suspicious infiltrate, nodule, or mass. Hepatobiliary: No focal liver abnormality is seen. No gallstones, gallbladder wall thickening, or biliary dilatation. Pancreas: Unremarkable. No pancreatic ductal dilatation or surrounding inflammatory changes. Spleen: Normal in size without focal abnormality. Adrenals/Urinary Tract: Adrenal glands are normal. A left renal cyst is identified. There is a calcification in the wall of the left renal cyst. No renal obstruction. The bladder is thick walled and poorly distended. Stomach/Bowel: The patient is status post colectomy with an ilealcolic anastomosis in the pelvis. Mild gastric distention persists but is improved in the interval. The proximal small bowel is relatively decompressed. Small bowel loops in the left lateral abdomen on series 2, image 43 may be thick walled but are poorly distended or evaluated without oral contrast. No pneumatosis. More distally, the small bowel is distended measuring up to 4 cm in caliber. There appears to be rectal wall thickening distal to the anastomosis, best appreciated on coronal image 79. The rectum is poorly distended however somewhat limiting evaluation. Mild thickening of the distal ileum, just proximal to the anastomosis persists is unchanged on axial image 77. Vascular/Lymphatic: Calcified atherosclerosis is associated with the non aneurysmal aorta and iliac vessels. No adenopathy. Reproductive: Prostate is  unremarkable. Other: Subcutaneous gas is likely  from recent injections or recent surgery. No associated fluid collection or soft tissue stranding. No free air identified. Ascites remains, particularly in the upper abdomen adjacent to the liver and spleen. Musculoskeletal: No acute bony abnormalities. IMPRESSION: 1. There appears to be wall thickening associated with the distal ileum, just proximal to the anastomosis in the pelvis. There also appears to be wall thickening associated with the rectum. A few loops of jejunum in in the lateral left abdomen may demonstrate mild wall thickening as well. These regions of wall thickening suggest an inflammatory or infectious process. The proximal small bowel is decompressed although the distal half of the small bowel is mildly dilated without a discrete site of obstruction identified. 2. Ascites.  Free air has resolved. 3. Atherosclerosis. Electronically Signed   By: Dorise Bullion III M.D   On: 05/17/2017 17:42   Dg Chest 1 View  Result Date: 05/17/2017 CLINICAL DATA:  Fever.  Sepsis. EXAM: CHEST 1 VIEW COMPARISON:  December 26, 2016 FINDINGS: The study is limited due to a low volume portable technique. No pneumothorax. No nodules or masses. Increased density in the right infrahilar region could represent vascular crowding but a superimposed subtle infiltrate is not excluded. A PA and lateral chest x-ray may better evaluate. No other acute abnormalities are identified. IMPRESSION: 1. Increased density in the right infrahilar region could represent vascular crowding versus a subtle superimposed infiltrate. A PA and lateral chest x-ray may better evaluate. Electronically Signed   By: Dorise Bullion III M.D   On: 05/17/2017 17:01   Dg Chest 2 View  Result Date: 05/17/2017 CLINICAL DATA:  Fever.  Sepsis. EXAM: CHEST  2 VIEW COMPARISON:  Single [facility] view earlier this day. Lung bases from abdominal CT earlier this day. FINDINGS: Low lung volumes. Bibasilar atelectasis,  including the right infrahilar opacity seen on prior exam. No confluent consolidation. Normal heart size and mediastinal contours. No pulmonary edema. No pneumothorax or pleural effusion. No acute osseous abnormalities. IMPRESSION: Bibasilar atelectasis, including right infrahilar opacity on prior radiograph. No consolidation to suggest pneumonia. Low lung volumes persist. Electronically Signed   By: Jeb Levering M.D.   On: 05/17/2017 18:15    ROS:  Review of systems not obtained due to patient factors.  Blood pressure (!) 111/52, pulse (!) 108, temperature (!) 101.4 F (38.6 C), temperature source Oral, resp. rate 17, height 6' (1.829 m), weight 184 lb 1.4 oz (83.5 kg), SpO2 97 %. Physical Exam: Pleasant well-developed well-nourished black male no acute distress. Head is normocephalic, atraumatic Neck is supple Lungs clear auscultation with equal breath sounds bilaterally Heart examination reveals a regular rate and rhythm without S3, S4, murmurs Abdomen is soft, nontender, nondistended. A well-healed surgical scars noted.  CT scan images personally reviewed  Assessment/Plan: Impression: Fever of unknown etiology. Patient's CT scan the abdomen is normal for a postoperative patient. There is no intra-abdominal abscess or pneumoperitoneum. He does have diarrhea, thus this may be secondary to C. difficile toxin, though patients can have this after having a near total colectomy. No need for acute surgical intervention. Will advance diet. C. difficile toxin is pending.  Aviva Signs 05/18/2017, 7:29 AM

## 2017-05-18 NOTE — Evaluation (Signed)
Clinical/Bedside Swallow Evaluation Patient Details  Name: Zachary [name] MRN: [id number] Date of Birth: [birth year]  Today's Date: 05/18/2017 Time: SLP Start Time (ACUTE ONLY): 1400 SLP Stop Time (ACUTE ONLY): 1415 SLP Time Calculation (min) (ACUTE ONLY): 15 min  Past Medical History:  Past Medical History:  Diagnosis Date  . Abnormal CT scan, kidney 10/06/2011  . Acute pyelonephritis 10/07/2011  . Anemia    normocytic  . Anxiety    mental retardation  . Bladder wall thickening 10/06/2011  . BPH (benign prostatic hypertrophy)   . Diabetes mellitus   . Edema     history of lower extremity edema  . Heme positive stool   . Hydronephrosis   . Hyperkalemia   . Hyperlipidemia   . Hypernatremia   . Hypertension   . Hypothyroidism   . Impaired speech   . 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 MR (mental retardation)   . Obstructive uropathy   . 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 Poor historian poor historian  . Protein calorie malnutrition ([place])   . Pyelonephritis   . Renal failure (ARF), acute on chronic (HCC)   . Renal insufficiency    chronic history  . Smoking   . Uremia   . Urinary retention   . UTI (lower urinary tract infection) 10/06/2011  . UTI (lower urinary tract infection)    Past Surgical History:  Past Surgical History:  Procedure Laterality Date  . AV FISTULA PLACEMENT Left 07/06/2015   Procedure:  INSERTION LEFT ARM ARTERIOVENOUS GORTEX GRAFT;  Surgeon: Angelia Mould, MD;  Location: [place];  Service: Vascular;  Laterality: Left;  . AV FISTULA PLACEMENT Right 02/26/2016   Procedure: ARTERIOVENOUS (AV) FISTULA CREATION ;  Surgeon: Angelia Mould, MD;  Location: [place];  Service: Vascular;  Laterality: Right;  . [place] REMOVAL Left 10/09/2015   Procedure: REMOVAL OF ARTERIOVENOUS GORETEX GRAFT ([place]) Evacuation of Lymphocele, Vein Patch angioplasty of brachial artery.;  Surgeon: Angelia Mould, MD;  Location: [place];  Service: Vascular;   Laterality: Left;  . BASCILIC VEIN TRANSPOSITION Right 02/26/2016   Procedure: Right BASCILIC VEIN TRANSPOSITION;  Surgeon: Angelia Mould, MD;  Location: [place];  Service: Vascular;  Laterality: Right;  . CIRCUMCISION N/A 01/04/2014   Procedure: CIRCUMCISION ADULT (procedure #1);  Surgeon: Marissa Nestle, MD;  [facility];  Service: Urology;  Laterality: N/A;  . COLECTOMY N/A 05/04/2017   Procedure: TOTAL COLECTOMY;  Surgeon: Aviva Signs, MD;  [facility];  Service: General;  Laterality: N/A;  . COLONOSCOPY N/A 04/27/2017   Procedure: COLONOSCOPY;  Surgeon: Daneil Dolin, MD;  [facility];  Service: Endoscopy;  Laterality: N/A;  245  . CYSTOSCOPY W/ RETROGRADES Bilateral 06/29/2015   Procedure: CYSTOSCOPY, DILATION OF URETHRAL STRICTURE WITH BILATERAL RETROGRADE PYELOGRAM,SUPRAPUBIC TUBE CHANGE;  Surgeon: Festus Aloe, MD;  [facility];  Service: Urology;  Laterality: Bilateral;  . CYSTOSCOPY WITH URETHRAL DILATATION N/A 12/29/2013   Procedure: CYSTOSCOPY WITH URETHRAL DILATATION;  Surgeon: Marissa Nestle, MD;  [facility];  Service: Urology;  Laterality: N/A;  . ESOPHAGOGASTRODUODENOSCOPY N/A 04/27/2017   Procedure: ESOPHAGOGASTRODUODENOSCOPY (EGD);  Surgeon: Daneil Dolin, MD;  [facility];  Service: Endoscopy;  Laterality: N/A;  . ORIF FEMUR FRACTURE Right 11/22/2016   Procedure: OPEN REDUCTION INTERNAL FIXATION (ORIF) DISTAL FEMUR FRACTURE;  Surgeon: Rod Can, MD;  Location: [place];  Service: Orthopedics;  Laterality: Right;  . PERIPHERAL VASCULAR CATHETERIZATION N/A 10/08/2015   Procedure: A/V  Shuntogram;  Surgeon: Angelia Mould, MD;  Location: [place] [facility];  Service: Cardiovascular;  Laterality: N/A;  . TRANSURETHRAL RESECTION OF PROSTATE N/A 01/04/2014   Procedure: TRANSURETHRAL RESECTION OF THE PROSTATE (TURP) (procedure #2);  Surgeon: Marissa Nestle, MD;  [facility];  Service: Urology;  Laterality: N/A;    HPI:  Pt is a 71 y.o. male with PMH of mental retardation, ESRD on HD, DM, HLD, HTN, recent total colectomy, disharged last week, brought back to the ER for fever. He has chronic diarrhea, and that is not new. Evaluation in the ER showed no leukocytosis, but he has elevated lactic to 3.15, and no hypotension. CXR showed "Bibasilar atelectasis, including right infrahilar opacity on prior radiograph. No consolidation to suggest pneumonia." Pt admitted for sepsis and possible colitis. Pt currently NPO and bedside swallow eval was ordered.    Assessment / Plan / Recommendation Clinical Impression  Pt showed no overt s/s of aspiration with numerous trials of thin liquids. Pt declined most other foods offered; declined foods on meal tray, chewed up graham cracker but spit it back out stating he did not want it. Mastication appeared adequate. Pt reportedly had coughing earlier on this admission when attempting meds. Given this, would recommend that meds be crushed in puree for now. Aspiration risk appears mild; recommend initiating regular diet/ thin liquids, intermittent supervision to assist with set-up/ ensure pt taking small bites/ sips. Will f/u x1 to ensure diet tolerance of regular solids. SLP Visit Diagnosis: Dysphagia, unspecified (R13.10)    Aspiration Risk  Mild aspiration risk    Diet Recommendation Regular;Thin liquid   Liquid Administration via: Cup;Straw Medication Administration: Crushed with puree Supervision: Patient able to self feed;Intermittent supervision to cue for compensatory strategies Compensations: Minimize environmental distractions;Slow rate;Small sips/bites Postural Changes: Seated upright at 90 degrees    Other  Recommendations Oral Care Recommendations: Oral care BID   Follow up Recommendations 24 hour supervision/assistance      Frequency and Duration min 1 x/week  1 week       Prognosis        Swallow Study   General HPI: Pt is a 71 y.o. male with  PMH of mental retardation, ESRD on HD, DM, HLD, HTN, recent total colectomy, disharged last week, brought back to the ER for fever. He has chronic diarrhea, and that is not new. Evaluation in the ER showed no leukocytosis, but he has elevated lactic to 3.15, and no hypotension. CXR showed "Bibasilar atelectasis, including right infrahilar opacity on prior radiograph. No consolidation to suggest pneumonia." Pt admitted for sepsis and possible colitis. Pt currently NPO and bedside swallow eval was ordered.  Type of Study: Bedside Swallow Evaluation Previous Swallow Assessment: none in chart Diet Prior to this Study: NPO Temperature Spikes Noted: Yes Respiratory Status: Room air History of Recent Intubation: No Behavior/Cognition: Alert;Cooperative Oral Cavity Assessment: Within Functional Limits Oral Cavity - Dentition: Adequate natural dentition Self-Feeding Abilities: Able to feed self Patient Positioning: Upright in bed Baseline Vocal Quality: Normal Volitional Cough: Strong    Oral/Motor/Sensory Function Overall Oral Motor/Sensory Function: Within functional limits   Ice Chips Ice chips: Not tested   Thin Liquid Thin Liquid: Within functional limits    Nectar Thick Nectar Thick Liquid: Not tested   Honey Thick Honey Thick Liquid: Not tested   Puree Puree: Not tested   Solid   GO   Solid:  (Attempted- pt chewed up food and spit it out) Presentation: Self Fed  Jerzi Tigert Dionicia Abler, MA, CCC-SLP 05/18/2017,2:18 PM

## 2017-05-18 NOTE — Consult Note (Signed)
Reason for Consult: End-stage renal disease Referring Physician: [name] is an 71 y.o. male.  HPI: He is a patient was history of hypertension, diabetes, end-stage renal disease on maintenance hemodialysis was admitted about a week ago and had total colectomy presently sent to the emergency room because of frequent diarrhea and fever. When patient was evaluated and was found to have some inflammation of his colectomy area. Patient however denies any nausea or vomiting. He denies also any abdominal pain. Presently consult is called for end-stage renal disease.  Past Medical History:  Diagnosis Date  . Abnormal CT scan, kidney 10/06/2011  . Acute pyelonephritis 10/07/2011  . Anemia    normocytic  . Anxiety    mental retardation  . Bladder wall thickening 10/06/2011  . BPH (benign prostatic hypertrophy)   . Diabetes mellitus   . Edema     history of lower extremity edema  . Heme positive stool   . Hydronephrosis   . Hyperkalemia   . Hyperlipidemia   . Hypernatremia   . Hypertension   . Hypothyroidism   . Impaired speech   . 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 MR (mental retardation)   . Obstructive uropathy   . 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 Poor historian poor historian  . Protein calorie malnutrition ([place])   . Pyelonephritis   . Renal failure (ARF), acute on chronic (HCC)   . Renal insufficiency    chronic history  . Smoking   . Uremia   . Urinary retention   . UTI (lower urinary tract infection) 10/06/2011  . UTI (lower urinary tract infection)     Past Surgical History:  Procedure Laterality Date  . AV FISTULA PLACEMENT Left 07/06/2015   Procedure:  INSERTION LEFT ARM ARTERIOVENOUS GORTEX GRAFT;  Surgeon: Angelia Mould, MD;  Location: [place];  Service: Vascular;  Laterality: Left;  . AV FISTULA PLACEMENT Right 02/26/2016   Procedure: ARTERIOVENOUS (AV) FISTULA CREATION ;  Surgeon: Angelia Mould, MD;  Location: [place];  Service:  Vascular;  Laterality: Right;  . [place] REMOVAL Left 10/09/2015   Procedure: REMOVAL OF ARTERIOVENOUS GORETEX GRAFT ([place]) Evacuation of Lymphocele, Vein Patch angioplasty of brachial artery.;  Surgeon: Angelia Mould, MD;  Location: [place];  Service: Vascular;  Laterality: Left;  . BASCILIC VEIN TRANSPOSITION Right 02/26/2016   Procedure: Right BASCILIC VEIN TRANSPOSITION;  Surgeon: Angelia Mould, MD;  Location: [place];  Service: Vascular;  Laterality: Right;  . CIRCUMCISION N/A 01/04/2014   Procedure: CIRCUMCISION ADULT (procedure #1);  Surgeon: Marissa Nestle, MD;  Location: [facility];  Service: Urology;  Laterality: N/A;  . COLECTOMY N/A 05/04/2017   Procedure: TOTAL COLECTOMY;  Surgeon: Aviva Signs, MD;  Location: [facility];  Service: General;  Laterality: N/A;  . COLONOSCOPY N/A 04/27/2017   Procedure: COLONOSCOPY;  Surgeon: Daneil Dolin, MD;  [facility];  Service: Endoscopy;  Laterality: N/A;  245  . CYSTOSCOPY W/ RETROGRADES Bilateral 06/29/2015   Procedure: CYSTOSCOPY, DILATION OF URETHRAL STRICTURE WITH BILATERAL RETROGRADE PYELOGRAM,SUPRAPUBIC TUBE CHANGE;  Surgeon: Festus Aloe, MD;  Location: [facility];  Service: Urology;  Laterality: Bilateral;  . CYSTOSCOPY WITH URETHRAL DILATATION N/A 12/29/2013   Procedure: CYSTOSCOPY WITH URETHRAL DILATATION;  Surgeon: Marissa Nestle, MD;  Location[facility];  Service: Urology;  Laterality: N/A;  . ESOPHAGOGASTRODUODENOSCOPY N/A 04/27/2017   Procedure: ESOPHAGOGASTRODUODENOSCOPY (EGD);  Surgeon: Daneil Dolin, MD;  [facility];  Service: Endoscopy;  Laterality: N/A;  . ORIF FEMUR FRACTURE Right 11/22/2016   Procedure: OPEN REDUCTION INTERNAL FIXATION (ORIF) DISTAL FEMUR FRACTURE;  Surgeon: Rod Can, MD;  Location: [place];  Service: Orthopedics;  Laterality: Right;  . PERIPHERAL VASCULAR CATHETERIZATION N/A 10/08/2015   Procedure: A/V Shuntogram;  Surgeon: Angelia Mould, MD;  Location: [place] [facility];   Service: Cardiovascular;  Laterality: N/A;  . TRANSURETHRAL RESECTION OF PROSTATE N/A 01/04/2014   Procedure: TRANSURETHRAL RESECTION OF THE PROSTATE (TURP) (procedure #2);  Surgeon: Marissa Nestle, MD;  Location: [facility];  Service: Urology;  Laterality: N/A;    Family History  Problem Relation Age of Onset  . Cancer Mother   . Colon cancer Neg Hx     Social History:  reports that he has never smoked. He has never used smokeless tobacco. He reports that he does not drink alcohol or use drugs.  Allergies: No Known Allergies  Medications: I have reviewed the patient's current medications.  Results for orders placed or performed during the hospital encounter of 05/17/17 (from the past 48 hour(s))  I-Stat CG4 Lactic Acid, ED  (not at  [facility])     Status: Abnormal   Collection Time: 05/17/17  3:57 PM  Result Value Ref Range   Lactic Acid, Venous 3.15 (HH) 0.5 - 1.9 mmol/L  Comprehensive metabolic panel     Status: Abnormal   Collection Time: 05/17/17  3:58 PM  Result Value Ref Range   Sodium 139 135 - 145 mmol/L   Potassium 3.6 3.5 - 5.1 mmol/L    Comment: SLIGHT HEMOLYSIS   Chloride 102 101 - 111 mmol/L   CO2 24 22 - 32 mmol/L   Glucose, Bld 131 (H) 65 - 99 mg/dL   BUN 28 (H) 6 - 20 mg/dL   Creatinine, Ser 4.49 (H) 0.61 - 1.24 mg/dL   Calcium 7.6 (L) 8.9 - 10.3 mg/dL   Total Protein 5.9 (L) 6.5 - 8.1 g/dL   Albumin 2.3 (L) 3.5 - 5.0 g/dL   AST 36 15 - 41 U/L   ALT 17 17 - 63 U/L   Alkaline Phosphatase 102 38 - 126 U/L   Total Bilirubin 1.0 0.3 - 1.2 mg/dL   GFR calc non Af Amer 12 (L) >60 mL/min   GFR calc Af Amer 14 (L) >60 mL/min    Comment: (NOTE) The eGFR has been calculated using the CKD EPI equation. This calculation has not been validated in all clinical situations. eGFR's persistently <60 mL/min signify possible Chronic Kidney Disease.    Anion gap 13 5 - 15  CBC WITH DIFFERENTIAL     Status: Abnormal   Collection Time: 05/17/17  3:58 PM  Result Value Ref Range    WBC 10.4 4.0 - 10.5 K/uL   RBC 3.22 (L) 4.22 - 5.81 MIL/uL   Hemoglobin 9.7 (L) 13.0 - 17.0 g/dL   HCT 30.6 (L) 39.0 - 52.0 %   MCV 95.0 78.0 - 100.0 fL   MCH 30.1 26.0 - 34.0 pg   MCHC 31.7 30.0 - 36.0 g/dL   RDW 17.2 (H) 11.5 - 15.5 %   Platelets 360 150 - 400 K/uL   Neutrophils Relative % 63 %   Lymphocytes Relative 21 %   Monocytes Relative 14 %   Eosinophils Relative 1 %   Basophils Relative 1 %   Neutro Abs 6.5 1.7 - 7.7 K/uL   Lymphs Abs 2.2 0.7 - 4.0 K/uL   Monocytes Absolute 1.5 (H) 0.1 - 1.0  K/uL   Eosinophils Absolute 0.1 0.0 - 0.7 K/uL   Basophils Absolute 0.1 0.0 - 0.1 K/uL   RBC Morphology POLYCHROMASIA PRESENT    WBC Morphology WHITE COUNT CONFIRMED ON SMEAR     Comment: ATYPICAL LYMPHOCYTES MILD LEFT SHIFT (1-5% METAS, OCC MYELO, OCC BANDS)   I-Stat CG4 Lactic Acid, ED  (not at  [facility])     Status: None   Collection Time: 05/17/17  9:23 PM  Result Value Ref Range   Lactic Acid, Venous 1.45 0.5 - 1.9 mmol/L  Urinalysis, Routine w reflex microscopic     Status: Abnormal   Collection Time: 05/18/17  3:00 AM  Result Value Ref Range   Color, Urine BROWN (A) YELLOW    Comment: BIOCHEMICALS MAY BE AFFECTED BY COLOR   APPearance CLOUDY (A) CLEAR   Specific Gravity, Urine >1.030 (H) 1.005 - 1.030   pH 5.0 5.0 - 8.0   Glucose, UA NEGATIVE NEGATIVE mg/dL   Hgb urine dipstick NEGATIVE NEGATIVE   Bilirubin Urine SMALL (A) NEGATIVE   Ketones, ur TRACE (A) NEGATIVE mg/dL   Protein, ur 30 (A) NEGATIVE mg/dL   Nitrite NEGATIVE NEGATIVE   Leukocytes, UA NEGATIVE NEGATIVE  Urinalysis, Microscopic (reflex)     Status: Abnormal   Collection Time: 05/18/17  3:00 AM  Result Value Ref Range   RBC / HPF NONE SEEN 0 - 5 RBC/hpf   WBC, UA 0-5 0 - 5 WBC/hpf   Bacteria, UA MANY (A) NONE SEEN   Squamous Epithelial / LPF NONE SEEN NONE SEEN    Ct Abdomen Pelvis Wo Contrast  Result Date: 05/17/2017 CLINICAL DATA:  Colectomy May 04, 2017. Diarrhea, fever, and vomiting today. No  pain. EXAM: CT ABDOMEN AND PELVIS WITHOUT CONTRAST TECHNIQUE: Multidetector CT imaging of the abdomen and pelvis was performed following the standard protocol without IV contrast. COMPARISON:  May 09, 2017 FINDINGS: Lower chest: Coronary artery calcifications are identified. Dependent atelectasis is seen bilaterally without suspicious infiltrate, nodule, or mass. Hepatobiliary: No focal liver abnormality is seen. No gallstones, gallbladder wall thickening, or biliary dilatation. Pancreas: Unremarkable. No pancreatic ductal dilatation or surrounding inflammatory changes. Spleen: Normal in size without focal abnormality. Adrenals/Urinary Tract: Adrenal glands are normal. A left renal cyst is identified. There is a calcification in the wall of the left renal cyst. No renal obstruction. The bladder is thick walled and poorly distended. Stomach/Bowel: The patient is status post colectomy with an ilealcolic anastomosis in the pelvis. Mild gastric distention persists but is improved in the interval. The proximal small bowel is relatively decompressed. Small bowel loops in the left lateral abdomen on series 2, image 43 may be thick walled but are poorly distended or evaluated without oral contrast. No pneumatosis. More distally, the small bowel is distended measuring up to 4 cm in caliber. There appears to be rectal wall thickening distal to the anastomosis, best appreciated on coronal image 79. The rectum is poorly distended however somewhat limiting evaluation. Mild thickening of the distal ileum, just proximal to the anastomosis persists is unchanged on axial image 77. Vascular/Lymphatic: Calcified atherosclerosis is associated with the non aneurysmal aorta and iliac vessels. No adenopathy. Reproductive: Prostate is unremarkable. Other: Subcutaneous gas is likely from recent injections or recent surgery. No associated fluid collection or soft tissue stranding. No free air identified. Ascites remains, particularly in the  upper abdomen adjacent to the liver and spleen. Musculoskeletal: No acute bony abnormalities. IMPRESSION: 1. There appears to be wall thickening associated with the distal ileum,  just proximal to the anastomosis in the pelvis. There also appears to be wall thickening associated with the rectum. A few loops of jejunum in in the lateral left abdomen may demonstrate mild wall thickening as well. These regions of wall thickening suggest an inflammatory or infectious process. The proximal small bowel is decompressed although the distal half of the small bowel is mildly dilated without a discrete site of obstruction identified. 2. Ascites.  Free air has resolved. 3. Atherosclerosis. Electronically Signed   By: Dorise Bullion III M.D   On: 05/17/2017 17:42   Dg Chest 1 View  Result Date: 05/17/2017 CLINICAL DATA:  Fever.  Sepsis. EXAM: CHEST 1 VIEW COMPARISON:  December 26, 2016 FINDINGS: The study is limited due to a low volume portable technique. No pneumothorax. No nodules or masses. Increased density in the right infrahilar region could represent vascular crowding but a superimposed subtle infiltrate is not excluded. A PA and lateral chest x-ray may better evaluate. No other acute abnormalities are identified. IMPRESSION: 1. Increased density in the right infrahilar region could represent vascular crowding versus a subtle superimposed infiltrate. A PA and lateral chest x-ray may better evaluate. Electronically Signed   By: Dorise Bullion III M.D   On: 05/17/2017 17:01   Dg Chest 2 View  Result Date: 05/17/2017 CLINICAL DATA:  Fever.  Sepsis. EXAM: CHEST  2 VIEW COMPARISON:  Single AP view earlier this day. Lung bases from abdominal CT earlier this day. FINDINGS: Low lung volumes. Bibasilar atelectasis, including the right infrahilar opacity seen on prior exam. No confluent consolidation. Normal heart size and mediastinal contours. No pulmonary edema. No pneumothorax or pleural effusion. No acute osseous  abnormalities. IMPRESSION: Bibasilar atelectasis, including right infrahilar opacity on prior radiograph. No consolidation to suggest pneumonia. Low lung volumes persist. Electronically Signed   By: Jeb Levering M.D.   On: 05/17/2017 18:15    Review of Systems  Constitutional: Positive for chills and fever.  Respiratory: Negative for cough and shortness of breath.   Cardiovascular: Negative for chest pain.  Gastrointestinal: Positive for diarrhea. Negative for abdominal pain, nausea and vomiting.   Blood pressure (!) 111/52, pulse (!) 108, temperature (!) 101.4 F (38.6 C), temperature source Oral, resp. rate 17, height 6' (1.829 m), weight 83.5 kg (184 lb 1.4 oz), SpO2 97 %. Physical Exam  Constitutional: No distress.  Neck: No JVD present.  Cardiovascular: Normal rate and regular rhythm.   Respiratory: No respiratory distress. He has no wheezes.  GI: He exhibits no distension. There is no tenderness. There is no rebound.  Musculoskeletal: He exhibits no edema.  Neurological: He is alert.    Assessment/Plan: Problem #1 fever : Etiology is this moment is no clear. Since patient has diarrhea is being evaluated for C. difficile colitis. Problem 2 end-stage renal disease: Patient status post hemodialysis on Saturday. Recently he doesn't have any nausea or vomiting. His potassium is normal. Problem #3 history of diabetes Problem #4 hypertension: His blood pressure is reasonably controlled Problem #5 history of colon CA status post total colectomy Problem #6  Bone and mineral disorder: His calcium is range Problem #7 fluid management: Patient presently doesn't have any sign of fluid overload. Plan: 1]We'll decrease IV fluids to 50 mL per hour 2]We'll check his renal panel and CBC in the morning 3]We'll make arrangements for patient to get dialysis tomorrow which is his regular scheduled.   Zachary [name] S 05/18/2017, 7:57 AM

## 2017-05-18 NOTE — Progress Notes (Signed)
Subjective: Patient was re-admitted from nursing home due to fever and diarrhea. He has leucocytosis and elevated lactic acid level. Patient had colectomy for multiple dysplastic polys. Patient feels better this morning. He is receiving  IV antibiotics.  Objective: Vital signs in last 24 hours: Temp:  [100.2 F (37.9 C)-102.3 F (39.1 C)] 101.4 F (38.6 C) (07/23 0241) Pulse Rate:  [107-124] 108 (07/23 0241) Resp:  [17-18] 17 (07/23 0130) BP: (106-132)/(52-76) 111/52 (07/23 0241) SpO2:  [89 %-100 %] 97 % (07/23 0241) Weight:  [83.5 kg (184 lb 1.4 oz)-86.6 kg (191 lb)] 83.5 kg (184 lb 1.4 oz) (07/23 0241) Weight change:  Last BM Date: 05/17/17  Intake/Output from previous day: 07/22 0701 - 07/23 0700 In: 364.3 [I.V.:314.3; IV Piggyback:50] Out: 300 [Urine:300]  PHYSICAL EXAM General appearance: no distress and slowed mentation Resp: clear to auscultation bilaterally Cardio: S1, S2 normal GI: soft, non-tender; bowel sounds normal; no masses,  no organomegaly Extremities: extremities normal, atraumatic, no cyanosis or edema  Lab Results:  Results for orders placed or performed during the hospital encounter of 05/17/17 (from the past 48 hour(s))  I-Stat CG4 Lactic Acid, ED  (not at  [facility])     Status: Abnormal   Collection Time: 05/17/17  3:57 PM  Result Value Ref Range   Lactic Acid, Venous 3.15 (HH) 0.5 - 1.9 mmol/L  Comprehensive metabolic panel     Status: Abnormal   Collection Time: 05/17/17  3:58 PM  Result Value Ref Range   Sodium 139 135 - 145 mmol/L   Potassium 3.6 3.5 - 5.1 mmol/L    Comment: SLIGHT HEMOLYSIS   Chloride 102 101 - 111 mmol/L   CO2 24 22 - 32 mmol/L   Glucose, Bld 131 (H) 65 - 99 mg/dL   BUN 28 (H) 6 - 20 mg/dL   Creatinine, Ser 4.49 (H) 0.61 - 1.24 mg/dL   Calcium 7.6 (L) 8.9 - 10.3 mg/dL   Total Protein 5.9 (L) 6.5 - 8.1 g/dL   Albumin 2.3 (L) 3.5 - 5.0 g/dL   AST 36 15 - 41 U/L   ALT 17 17 - 63 U/L   Alkaline Phosphatase 102 38 - 126 U/L    Total Bilirubin 1.0 0.3 - 1.2 mg/dL   GFR calc non Af Amer 12 (L) >60 mL/min   GFR calc Af Amer 14 (L) >60 mL/min    Comment: (NOTE) The eGFR has been calculated using the CKD EPI equation. This calculation has not been validated in all clinical situations. eGFR's persistently <60 mL/min signify possible Chronic Kidney Disease.    Anion gap 13 5 - 15  CBC WITH DIFFERENTIAL     Status: Abnormal   Collection Time: 05/17/17  3:58 PM  Result Value Ref Range   WBC 10.4 4.0 - 10.5 K/uL   RBC 3.22 (L) 4.22 - 5.81 MIL/uL   Hemoglobin 9.7 (L) 13.0 - 17.0 g/dL   HCT 30.6 (L) 39.0 - 52.0 %   MCV 95.0 78.0 - 100.0 fL   MCH 30.1 26.0 - 34.0 pg   MCHC 31.7 30.0 - 36.0 g/dL   RDW 17.2 (H) 11.5 - 15.5 %   Platelets 360 150 - 400 K/uL   Neutrophils Relative % 63 %   Lymphocytes Relative 21 %   Monocytes Relative 14 %   Eosinophils Relative 1 %   Basophils Relative 1 %   Neutro Abs 6.5 1.7 - 7.7 K/uL   Lymphs Abs 2.2 0.7 - 4.0 K/uL   Monocytes  Absolute 1.5 (H) 0.1 - 1.0 K/uL   Eosinophils Absolute 0.1 0.0 - 0.7 K/uL   Basophils Absolute 0.1 0.0 - 0.1 K/uL   RBC Morphology POLYCHROMASIA PRESENT    WBC Morphology WHITE COUNT CONFIRMED ON SMEAR     Comment: ATYPICAL LYMPHOCYTES MILD LEFT SHIFT (1-5% METAS, OCC MYELO, OCC BANDS)   I-Stat CG4 Lactic Acid, ED  (not at  [facility])     Status: None   Collection Time: 05/17/17  9:23 PM  Result Value Ref Range   Lactic Acid, Venous 1.45 0.5 - 1.9 mmol/L  Urinalysis, Routine w reflex microscopic     Status: Abnormal   Collection Time: 05/18/17  3:00 AM  Result Value Ref Range   Color, Urine BROWN (A) YELLOW    Comment: BIOCHEMICALS MAY BE AFFECTED BY COLOR   APPearance CLOUDY (A) CLEAR   Specific Gravity, Urine >1.030 (H) 1.005 - 1.030   pH 5.0 5.0 - 8.0   Glucose, UA NEGATIVE NEGATIVE mg/dL   Hgb urine dipstick NEGATIVE NEGATIVE   Bilirubin Urine SMALL (A) NEGATIVE   Ketones, ur TRACE (A) NEGATIVE mg/dL   Protein, ur 30 (A) NEGATIVE mg/dL    Nitrite NEGATIVE NEGATIVE   Leukocytes, UA NEGATIVE NEGATIVE  Urinalysis, Microscopic (reflex)     Status: Abnormal   Collection Time: 05/18/17  3:00 AM  Result Value Ref Range   RBC / HPF NONE SEEN 0 - 5 RBC/hpf   WBC, UA 0-5 0 - 5 WBC/hpf   Bacteria, UA MANY (A) NONE SEEN   Squamous Epithelial / LPF NONE SEEN NONE SEEN    ABGS No results for input(s): PHART, PO2ART, TCO2, HCO3 in the last 72 hours.  Invalid input(s): PCO2 CULTURES No results found for this or any previous visit (from the past 240 hour(s)). Studies/Results: Ct Abdomen Pelvis Wo Contrast  Result Date: 05/17/2017 CLINICAL DATA:  Colectomy May 04, 2017. Diarrhea, fever, and vomiting today. No pain. EXAM: CT ABDOMEN AND PELVIS WITHOUT CONTRAST TECHNIQUE: Multidetector CT imaging of the abdomen and pelvis was performed following the standard protocol without IV contrast. COMPARISON:  May 09, 2017 FINDINGS: Lower chest: Coronary artery calcifications are identified. Dependent atelectasis is seen bilaterally without suspicious infiltrate, nodule, or mass. Hepatobiliary: No focal liver abnormality is seen. No gallstones, gallbladder wall thickening, or biliary dilatation. Pancreas: Unremarkable. No pancreatic ductal dilatation or surrounding inflammatory changes. Spleen: Normal in size without focal abnormality. Adrenals/Urinary Tract: Adrenal glands are normal. A left renal cyst is identified. There is a calcification in the wall of the left renal cyst. No renal obstruction. The bladder is thick walled and poorly distended. Stomach/Bowel: The patient is status post colectomy with an ilealcolic anastomosis in the pelvis. Mild gastric distention persists but is improved in the interval. The proximal small bowel is relatively decompressed. Small bowel loops in the left lateral abdomen on series 2, image 43 may be thick walled but are poorly distended or evaluated without oral contrast. No pneumatosis. More distally, the small bowel is  distended measuring up to 4 cm in caliber. There appears to be rectal wall thickening distal to the anastomosis, best appreciated on coronal image 79. The rectum is poorly distended however somewhat limiting evaluation. Mild thickening of the distal ileum, just proximal to the anastomosis persists is unchanged on axial image 77. Vascular/Lymphatic: Calcified atherosclerosis is associated with the non aneurysmal aorta and iliac vessels. No adenopathy. Reproductive: Prostate is unremarkable. Other: Subcutaneous gas is likely from recent injections or recent surgery. No associated fluid  collection or soft tissue stranding. No free air identified. Ascites remains, particularly in the upper abdomen adjacent to the liver and spleen. Musculoskeletal: No acute bony abnormalities. IMPRESSION: 1. There appears to be wall thickening associated with the distal ileum, just proximal to the anastomosis in the pelvis. There also appears to be wall thickening associated with the rectum. A few loops of jejunum in in the lateral left abdomen may demonstrate mild wall thickening as well. These regions of wall thickening suggest an inflammatory or infectious process. The proximal small bowel is decompressed although the distal half of the small bowel is mildly dilated without a discrete site of obstruction identified. 2. Ascites.  Free air has resolved. 3. Atherosclerosis. Electronically Signed   By: Dorise Bullion III M.D   On: 05/17/2017 17:42   Dg Chest 1 View  Result Date: 05/17/2017 CLINICAL DATA:  Fever.  Sepsis. EXAM: CHEST 1 VIEW COMPARISON:  December 26, 2016 FINDINGS: The study is limited due to a low volume portable technique. No pneumothorax. No nodules or masses. Increased density in the right infrahilar region could represent vascular crowding but a superimposed subtle infiltrate is not excluded. A PA and lateral chest x-ray may better evaluate. No other acute abnormalities are identified. IMPRESSION: 1. Increased density  in the right infrahilar region could represent vascular crowding versus a subtle superimposed infiltrate. A PA and lateral chest x-ray may better evaluate. Electronically Signed   By: Dorise Bullion III M.D   On: 05/17/2017 17:01   Dg Chest 2 View  Result Date: 05/17/2017 CLINICAL DATA:  Fever.  Sepsis. EXAM: CHEST  2 VIEW COMPARISON:  Single AP view earlier this day. Lung bases from abdominal CT earlier this day. FINDINGS: Low lung volumes. Bibasilar atelectasis, including the right infrahilar opacity seen on prior exam. No confluent consolidation. Normal heart size and mediastinal contours. No pulmonary edema. No pneumothorax or pleural effusion. No acute osseous abnormalities. IMPRESSION: Bibasilar atelectasis, including right infrahilar opacity on prior radiograph. No consolidation to suggest pneumonia. Low lung volumes persist. Electronically Signed   By: Jeb Levering M.D.   On: 05/17/2017 18:15    Medications: I have reviewed the patient's current medications.  Assesment:  Principal Problem:   Sepsis ([place]) Active Problems:   Diabetes mellitus ([place])   Mental retardation   End stage renal disease ([place])   Anemia in CKD (chronic kidney disease)   Colitis    Plan:  Medications reviewed Will continue IV antibiotics Will follow culture results. Surgical and nephrology consult appreciated    LOS: 1 day   Casara Perrier 05/18/2017, 8:31 AM

## 2017-05-19 LAB — RENAL FUNCTION PANEL
Albumin: 1.8 g/dL — ABNORMAL LOW (ref 3.5–5.0)
Anion gap: 9 (ref 5–15)
BUN: 39 mg/dL — ABNORMAL HIGH (ref 6–20)
CO2: 26 mmol/L (ref 22–32)
Calcium: 7.5 mg/dL — ABNORMAL LOW (ref 8.9–10.3)
Chloride: 103 mmol/L (ref 101–111)
Creatinine, Ser: 5.65 mg/dL — ABNORMAL HIGH (ref 0.61–1.24)
GFR calc Af Amer: 11 mL/min — ABNORMAL LOW (ref >60)
GFR calc non Af Amer: 9 mL/min — ABNORMAL LOW (ref >60)
Glucose, Bld: 78 mg/dL (ref 65–99)
Phosphorus: 4.8 mg/dL — ABNORMAL HIGH (ref 2.5–4.6)
Potassium: 2.7 mmol/L — CL (ref 3.5–5.1)
Sodium: 138 mmol/L (ref 135–145)

## 2017-05-19 LAB — CBC
HCT: 22.5 % — ABNORMAL LOW (ref 39.0–52.0)
Hemoglobin: 7.2 g/dL — ABNORMAL LOW (ref 13.0–17.0)
MCH: 29.8 pg (ref 26.0–34.0)
MCHC: 32 g/dL (ref 30.0–36.0)
MCV: 93 fL (ref 78.0–100.0)
Platelets: 240 10*3/uL (ref 150–400)
RBC: 2.42 MIL/uL — ABNORMAL LOW (ref 4.22–5.81)
RDW: 17.1 % — ABNORMAL HIGH (ref 11.5–15.5)
WBC: 10.1 10*3/uL (ref 4.0–10.5)

## 2017-05-19 MED ORDER — EPOETIN ALFA 10000 UNIT/ML IJ SOLN
10000.0000 [IU] | INTRAMUSCULAR | Status: DC
  Administered 2017-05-19 – 2017-05-21 (×2): 10000 [IU] via INTRAVENOUS
  Filled 2017-05-19 (×2): qty 1

## 2017-05-19 MED ORDER — SODIUM CHLORIDE 0.9 % IV SOLN: 100.0000 mL | INTRAVENOUS | Status: DC | PRN

## 2017-05-19 MED ORDER — PENTAFLUOROPROP-TETRAFLUOROETH EX AERO: 1.0000 "application " | INHALATION_SPRAY | CUTANEOUS | Status: DC | PRN

## 2017-05-19 MED ORDER — LIDOCAINE-PRILOCAINE 2.5-2.5 % EX CREA: 1.0000 "application " | TOPICAL_CREAM | CUTANEOUS | Status: DC | PRN

## 2017-05-19 MED ORDER — LIDOCAINE HCL (PF) 1 % IJ SOLN: 5.0000 mL | INTRAMUSCULAR | Status: DC | PRN

## 2017-05-19 MED ORDER — EPOETIN ALFA 10000 UNIT/ML IJ SOLN
INTRAMUSCULAR | Status: AC
  Administered 2017-05-19: 10000 [IU] via INTRAVENOUS
  Filled 2017-05-19: qty 1

## 2017-05-19 NOTE — Progress Notes (Signed)
Subjective: Interval History: has no complaint of nausea or vomiting. Patient also denies any abdominal pain..  Objective: Vital signs in last 24 hours: Temp:  [98.2 F (36.8 C)-99 F (37.2 C)] 98.2 F (36.8 C) (07/24 0500) Pulse Rate:  [90-95] 90 (07/24 0500) Resp:  [16-18] 16 (07/24 0500) BP: (109-111)/(53-54) 109/54 (07/24 0500) SpO2:  [95 %-99 %] 99 % (07/24 0500) Weight change:   Intake/Output from previous day: 07/23 0701 - 07/24 0700 In: 2038.8 [P.O.:540; I.V.:1448.8; IV Piggyback:50] Out: 1 [Stool:1] Intake/Output this shift: No intake/output data recorded.  General appearance: alert, cooperative and no distress Resp: clear to auscultation bilaterally Cardio: regular rate and rhythm GI: Soft and nontender. He has positive bowel sounds. Extremities: No edema  Lab Results:  Recent Labs  05/18/17 1026 05/19/17 0650  WBC 10.6* 10.1  HGB 8.5* 7.2*  HCT 26.8* 22.5*  PLT 306 240   BMET:  Recent Labs  05/18/17 1026 05/19/17 0653  NA 137 138  K 3.4* 2.7*  CL 102 103  CO2 25 26  GLUCOSE 145* 78  BUN 35* 39*  CREATININE 5.35* 5.65*  CALCIUM 7.6* 7.5*   No results for input(s): PTH in the last 72 hours. Iron Studies: No results for input(s): IRON, TIBC, TRANSFERRIN, FERRITIN in the last 72 hours.  Studies/Results: Ct Abdomen Pelvis Wo Contrast  Result Date: 05/17/2017 CLINICAL DATA:  Colectomy May 04, 2017. Diarrhea, fever, and vomiting today. No pain. EXAM: CT ABDOMEN AND PELVIS WITHOUT CONTRAST TECHNIQUE: Multidetector CT imaging of the abdomen and pelvis was performed following the standard protocol without IV contrast. COMPARISON:  May 09, 2017 FINDINGS: Lower chest: Coronary artery calcifications are identified. Dependent atelectasis is seen bilaterally without suspicious infiltrate, nodule, or mass. Hepatobiliary: No focal liver abnormality is seen. No gallstones, gallbladder wall thickening, or biliary dilatation. Pancreas: Unremarkable. No pancreatic  ductal dilatation or surrounding inflammatory changes. Spleen: Normal in size without focal abnormality. Adrenals/Urinary Tract: Adrenal glands are normal. A left renal cyst is identified. There is a calcification in the wall of the left renal cyst. No renal obstruction. The bladder is thick walled and poorly distended. Stomach/Bowel: The patient is status post colectomy with an ilealcolic anastomosis in the pelvis. Mild gastric distention persists but is improved in the interval. The proximal small bowel is relatively decompressed. Small bowel loops in the left lateral abdomen on series 2, image 43 may be thick walled but are poorly distended or evaluated without oral contrast. No pneumatosis. More distally, the small bowel is distended measuring up to 4 cm in caliber. There appears to be rectal wall thickening distal to the anastomosis, best appreciated on coronal image 79. The rectum is poorly distended however somewhat limiting evaluation. Mild thickening of the distal ileum, just proximal to the anastomosis persists is unchanged on axial image 77. Vascular/Lymphatic: Calcified atherosclerosis is associated with the non aneurysmal aorta and iliac vessels. No adenopathy. Reproductive: Prostate is unremarkable. Other: Subcutaneous gas is likely from recent injections or recent surgery. No associated fluid collection or soft tissue stranding. No free air identified. Ascites remains, particularly in the upper abdomen adjacent to the liver and spleen. Musculoskeletal: No acute bony abnormalities. IMPRESSION: 1. There appears to be wall thickening associated with the distal ileum, just proximal to the anastomosis in the pelvis. There also appears to be wall thickening associated with the rectum. A few loops of jejunum in in the lateral left abdomen may demonstrate mild wall thickening as well. These regions of wall thickening suggest an inflammatory or  infectious process. The proximal small bowel is decompressed  although the distal half of the small bowel is mildly dilated without a discrete site of obstruction identified. 2. Ascites.  Free air has resolved. 3. Atherosclerosis. Electronically Signed   By: Dorise Bullion III M.D   On: 05/17/2017 17:42   Dg Chest 1 View  Result Date: 05/17/2017 CLINICAL DATA:  Fever.  Sepsis. EXAM: CHEST 1 VIEW COMPARISON:  December 26, 2016 FINDINGS: The study is limited due to a low volume portable technique. No pneumothorax. No nodules or masses. Increased density in the right infrahilar region could represent vascular crowding but a superimposed subtle infiltrate is not excluded. A PA and lateral chest x-ray may better evaluate. No other acute abnormalities are identified. IMPRESSION: 1. Increased density in the right infrahilar region could represent vascular crowding versus a subtle superimposed infiltrate. A PA and lateral chest x-ray may better evaluate. Electronically Signed   By: Dorise Bullion III M.D   On: 05/17/2017 17:01   Dg Chest 2 View  Result Date: 05/17/2017 CLINICAL DATA:  Fever.  Sepsis. EXAM: CHEST  2 VIEW COMPARISON:  Single AP view earlier this day. Lung bases from abdominal CT earlier this day. FINDINGS: Low lung volumes. Bibasilar atelectasis, including the right infrahilar opacity seen on prior exam. No confluent consolidation. Normal heart size and mediastinal contours. No pulmonary edema. No pneumothorax or pleural effusion. No acute osseous abnormalities. IMPRESSION: Bibasilar atelectasis, including right infrahilar opacity on prior radiograph. No consolidation to suggest pneumonia. Low lung volumes persist. Electronically Signed   By: Jeb Levering M.D.   On: 05/17/2017 18:15    I have reviewed the patient's current medications.  Assessment/Plan: Problem #1 fever: Etiology at this moment is not clear. Patient is afebrile today was normal. White blood cell count. Problem but to end-stage renal disease: His status post hemodialysis on Saturday.  Patient is due for dialysis today. Problem #3 hypokalemia Problem #4 history of diabetis Problem #5 anemia: His hemoglobin is low and declining. Problem #6 bone and middle disorder: His calcium and phosphorus is in range Plan: 1]We'll make arrangements for patient to get dialysis today 2]We'll use 4 K/2.5 calcium bath 3]We'll use Epogen 10,000 units IV after each dialysis 4]We'll decrease IV fluids to 50 mL per hour 5]We'll check his renal panel and CBC in the morning.    LOS: 2 days   Kelan Pritt S 05/19/2017,8:11 AM

## 2017-05-19 NOTE — Procedures (Signed)
     HEMODIALYSIS TREATMENT NOTE:  3 hour heparin-free dialysis completed via right upper arm AVF (16g/antegrade). Goal met: 2 liters removed without interruption in ultrafiltration. All blood was returned and hemostasis was achieved within 10 minutes. Report called to Selinda Orion, RN.  Rockwell Alexandria, RN, CDN

## 2017-05-19 NOTE — Progress Notes (Signed)
Subjective: Patient is hungry. Denies any abdominal pain.  Objective: Vital signs in last 24 hours: Temp:  [98.2 F (36.8 C)-99 F (37.2 C)] 98.2 F (36.8 C) (07/24 0500) Pulse Rate:  [90-95] 90 (07/24 0500) Resp:  [16-18] 16 (07/24 0500) BP: (109-111)/(53-54) 109/54 (07/24 0500) SpO2:  [95 %-99 %] 99 % (07/24 0500) Last BM Date: 05/17/17  Intake/Output from previous day: 07/23 0701 - 07/24 0700 In: 2038.8 [P.O.:540; I.V.:1448.8; IV Piggyback:50] Out: 1 [Stool:1] Intake/Output this shift: No intake/output data recorded.  General appearance: alert, cooperative and no distress GI: soft, non-tender; bowel sounds normal; no masses,  no organomegaly  Lab Results:   Recent Labs  05/18/17 1026 05/19/17 0650  WBC 10.6* 10.1  HGB 8.5* 7.2*  HCT 26.8* 22.5*  PLT 306 240   BMET  Recent Labs  05/18/17 1026 05/19/17 0653  NA 137 138  K 3.4* 2.7*  CL 102 103  CO2 25 26  GLUCOSE 145* 78  BUN 35* 39*  CREATININE 5.35* 5.65*  CALCIUM 7.6* 7.5*   PT/INR No results for input(s): LABPROT, INR in the last 72 hours.  Studies/Results: Ct Abdomen Pelvis Wo Contrast  Result Date: 05/17/2017 CLINICAL DATA:  Colectomy May 04, 2017. Diarrhea, fever, and vomiting today. No pain. EXAM: CT ABDOMEN AND PELVIS WITHOUT CONTRAST TECHNIQUE: Multidetector CT imaging of the abdomen and pelvis was performed following the standard protocol without IV contrast. COMPARISON:  May 09, 2017 FINDINGS: Lower chest: Coronary artery calcifications are identified. Dependent atelectasis is seen bilaterally without suspicious infiltrate, nodule, or mass. Hepatobiliary: No focal liver abnormality is seen. No gallstones, gallbladder wall thickening, or biliary dilatation. Pancreas: Unremarkable. No pancreatic ductal dilatation or surrounding inflammatory changes. Spleen: Normal in size without focal abnormality. Adrenals/Urinary Tract: Adrenal glands are normal. A left renal cyst is identified. There is a  calcification in the wall of the left renal cyst. No renal obstruction. The bladder is thick walled and poorly distended. Stomach/Bowel: The patient is status post colectomy with an ilealcolic anastomosis in the pelvis. Mild gastric distention persists but is improved in the interval. The proximal small bowel is relatively decompressed. Small bowel loops in the left lateral abdomen on series 2, image 43 may be thick walled but are poorly distended or evaluated without oral contrast. No pneumatosis. More distally, the small bowel is distended measuring up to 4 cm in caliber. There appears to be rectal wall thickening distal to the anastomosis, best appreciated on coronal image 79. The rectum is poorly distended however somewhat limiting evaluation. Mild thickening of the distal ileum, just proximal to the anastomosis persists is unchanged on axial image 77. Vascular/Lymphatic: Calcified atherosclerosis is associated with the non aneurysmal aorta and iliac vessels. No adenopathy. Reproductive: Prostate is unremarkable. Other: Subcutaneous gas is likely from recent injections or recent surgery. No associated fluid collection or soft tissue stranding. No free air identified. Ascites remains, particularly in the upper abdomen adjacent to the liver and spleen. Musculoskeletal: No acute bony abnormalities. IMPRESSION: 1. There appears to be wall thickening associated with the distal ileum, just proximal to the anastomosis in the pelvis. There also appears to be wall thickening associated with the rectum. A few loops of jejunum in in the lateral left abdomen may demonstrate mild wall thickening as well. These regions of wall thickening suggest an inflammatory or infectious process. The proximal small bowel is decompressed although the distal half of the small bowel is mildly dilated without a discrete site of obstruction identified. 2. Ascites.  Free  air has resolved. 3. Atherosclerosis. Electronically Signed   By: Dorise Bullion III M.D   On: 05/17/2017 17:42   Dg Chest 1 View  Result Date: 05/17/2017 CLINICAL DATA:  Fever.  Sepsis. EXAM: CHEST 1 VIEW COMPARISON:  December 26, 2016 FINDINGS: The study is limited due to a low volume portable technique. No pneumothorax. No nodules or masses. Increased density in the right infrahilar region could represent vascular crowding but a superimposed subtle infiltrate is not excluded. A PA and lateral chest x-ray may better evaluate. No other acute abnormalities are identified. IMPRESSION: 1. Increased density in the right infrahilar region could represent vascular crowding versus a subtle superimposed infiltrate. A PA and lateral chest x-ray may better evaluate. Electronically Signed   By: Dorise Bullion III M.D   On: 05/17/2017 17:01   Dg Chest 2 View  Result Date: 05/17/2017 CLINICAL DATA:  Fever.  Sepsis. EXAM: CHEST  2 VIEW COMPARISON:  Single AP view earlier this day. Lung bases from abdominal CT earlier this day. FINDINGS: Low lung volumes. Bibasilar atelectasis, including the right infrahilar opacity seen on prior exam. No confluent consolidation. Normal heart size and mediastinal contours. No pulmonary edema. No pneumothorax or pleural effusion. No acute osseous abnormalities. IMPRESSION: Bibasilar atelectasis, including right infrahilar opacity on prior radiograph. No consolidation to suggest pneumonia. Low lung volumes persist. Electronically Signed   By: Jeb Levering M.D.   On: 05/17/2017 18:15    Anti-infectives: Anti-infectives    Start     Dose/Rate Route Frequency Ordered Stop   05/18/17 0600  piperacillin-tazobactam (ZOSYN) IVPB 3.375 g     3.375 g 12.5 mL/hr over 240 Minutes Intravenous Every 12 hours 05/17/17 1728     05/17/17 1730  piperacillin-tazobactam (ZOSYN) IVPB 3.375 g     3.375 g 100 mL/hr over 30 Minutes Intravenous  Once 05/17/17 1728 05/17/17 1812      Assessment/Plan: Impression: Patient has had multiple bowel movements which is most  likely secondary to his near total colectomy. C. difficile is negative. Would treat symptomatically. Will follow peripherally with you.  LOS: 2 days    Aviva Signs 05/19/2017

## 2017-05-19 NOTE — Progress Notes (Signed)
CRITICAL VALUE ALERT  Critical Value:  K 2.7  Date & Time Notifed:  05/19/17 0741  Provider Notified: Dr. Legrand Rams on unit 0800  Orders Received/Actions taken: Dr. Legrand Rams in room to see patient and notified of Potassium results.

## 2017-05-19 NOTE — Progress Notes (Signed)
  Speech Language Pathology Treatment: Dysphagia  Patient Details Name: Zachary [name] MRN: [id number] DOB: Aug 07, 1946 Today's Date: 05/19/2017 Time: 3159-4585 SLP Time Calculation (min) (ACUTE ONLY): 11 min  Assessment / Plan / Recommendation Clinical Impression  Dysphagia treatment provided to check diet tolerance. Pt tolerated regular solid food consistency and thin liquids without any overt s/s of aspiration. Pt had one instance of oral residuals of solid at back of tongue which was cleared when cued to swallow again. Pt has been afebrile this date. Recommend continuing regular diet/ thin liquids, meds crushed in puree, supervision to assist with setup/ feeding if needed. SLP will sign off at this time; please re-consult if needs arise.   HPI HPI: Pt is a 71 y.o. male with PMH of mental retardation, ESRD on HD, DM, HLD, HTN, recent total colectomy, disharged last week, brought back to the ER for fever. He has chronic diarrhea, and that is not new. Evaluation in the ER showed no leukocytosis, but he has elevated lactic to 3.15, and no hypotension. CXR showed "Bibasilar atelectasis, including right infrahilar opacity on prior radiograph. No consolidation to suggest pneumonia." Pt admitted for sepsis and possible colitis. Pt currently NPO and bedside swallow eval was ordered.       SLP Plan  Discharge SLP treatment due to (comment) (goals met)       Recommendations  Diet recommendations: Regular;Thin liquid Liquids provided via: Cup;Straw Medication Administration: Crushed with puree Supervision: Staff to assist with self feeding Compensations: Minimize environmental distractions;Slow rate;Small sips/bites Postural Changes and/or Swallow Maneuvers: Seated upright 90 degrees                Oral Care Recommendations: Oral care BID Follow up Recommendations: 24 hour supervision/assistance SLP Visit Diagnosis: Dysphagia, unspecified (R13.10) Plan: Discharge SLP treatment due to  (comment) (goals met)       GO                Kern Reap, MA, CCC-SLP 05/19/2017, 1:43 PM

## 2017-05-19 NOTE — Progress Notes (Signed)
Subjective: Patient feels better. He wants to eat. His C.difficili is negative. Blood culture is also showed no growth so far. He is going to have dialysis today and discussed with his nephrologist about low K+ and  It will be adjust on dialysis.  Objective: Vital signs in last 24 hours: Temp:  [98.2 F (36.8 C)-99 F (37.2 C)] 98.2 F (36.8 C) (07/24 0500) Pulse Rate:  [90-95] 90 (07/24 0500) Resp:  [16-18] 16 (07/24 0500) BP: (109-111)/(53-54) 109/54 (07/24 0500) SpO2:  [95 %-99 %] 99 % (07/24 0500) Weight change:  Last BM Date: 05/17/17  Intake/Output from previous day: 07/23 0701 - 07/24 0700 In: 2038.8 [P.O.:540; I.V.:1448.8; IV Piggyback:50] Out: 1 [Stool:1]  PHYSICAL EXAM General appearance: no distress and slowed mentation Resp: clear to auscultation bilaterally Cardio: S1, S2 normal GI: soft, non-tender; bowel sounds normal; no masses,  no organomegaly Extremities: extremities normal, atraumatic, no cyanosis or edema  Lab Results:  Results for orders placed or performed during the hospital encounter of 05/17/17 (from the past 48 hour(s))  I-Stat CG4 Lactic Acid, ED  (not at  [facility])     Status: Abnormal   Collection Time: 05/17/17  3:57 PM  Result Value Ref Range   Lactic Acid, Venous 3.15 (HH) 0.5 - 1.9 mmol/L  Comprehensive metabolic panel     Status: Abnormal   Collection Time: 05/17/17  3:58 PM  Result Value Ref Range   Sodium 139 135 - 145 mmol/L   Potassium 3.6 3.5 - 5.1 mmol/L    Comment: SLIGHT HEMOLYSIS   Chloride 102 101 - 111 mmol/L   CO2 24 22 - 32 mmol/L   Glucose, Bld 131 (H) 65 - 99 mg/dL   BUN 28 (H) 6 - 20 mg/dL   Creatinine, Ser 4.49 (H) 0.61 - 1.24 mg/dL   Calcium 7.6 (L) 8.9 - 10.3 mg/dL   Total Protein 5.9 (L) 6.5 - 8.1 g/dL   Albumin 2.3 (L) 3.5 - 5.0 g/dL   AST 36 15 - 41 U/L   ALT 17 17 - 63 U/L   Alkaline Phosphatase 102 38 - 126 U/L   Total Bilirubin 1.0 0.3 - 1.2 mg/dL   GFR calc non Af Amer 12 (L) >60 mL/min   GFR calc Af Amer  14 (L) >60 mL/min    Comment: (NOTE) The eGFR has been calculated using the CKD EPI equation. This calculation has not been validated in all clinical situations. eGFR's persistently <60 mL/min signify possible Chronic Kidney Disease.    Anion gap 13 5 - 15  CBC WITH DIFFERENTIAL     Status: Abnormal   Collection Time: 05/17/17  3:58 PM  Result Value Ref Range   WBC 10.4 4.0 - 10.5 K/uL   RBC 3.22 (L) 4.22 - 5.81 MIL/uL   Hemoglobin 9.7 (L) 13.0 - 17.0 g/dL   HCT 30.6 (L) 39.0 - 52.0 %   MCV 95.0 78.0 - 100.0 fL   MCH 30.1 26.0 - 34.0 pg   MCHC 31.7 30.0 - 36.0 g/dL   RDW 17.2 (H) 11.5 - 15.5 %   Platelets 360 150 - 400 K/uL   Neutrophils Relative % 63 %   Lymphocytes Relative 21 %   Monocytes Relative 14 %   Eosinophils Relative 1 %   Basophils Relative 1 %   Neutro Abs 6.5 1.7 - 7.7 K/uL   Lymphs Abs 2.2 0.7 - 4.0 K/uL   Monocytes Absolute 1.5 (H) 0.1 - 1.0 K/uL   Eosinophils Absolute 0.1  0.0 - 0.7 K/uL   Basophils Absolute 0.1 0.0 - 0.1 K/uL   RBC Morphology POLYCHROMASIA PRESENT    WBC Morphology WHITE COUNT CONFIRMED ON SMEAR     Comment: ATYPICAL LYMPHOCYTES MILD LEFT SHIFT (1-5% METAS, OCC MYELO, OCC BANDS)   Blood culture (routine x 2)     Status: None (Preliminary result)   Collection Time: 05/17/17  4:10 PM  Result Value Ref Range   Specimen Description BLOOD LEFT ARM    Special Requests Blood Culture adequate volume    Culture NO GROWTH < 24 HOURS    Report Status PENDING   Blood culture (routine x 2)     Status: None (Preliminary result)   Collection Time: 05/17/17  4:29 PM  Result Value Ref Range   Specimen Description BLOOD LEFT HAND    Special Requests      BOTTLES DRAWN AEROBIC ONLY Blood Culture results may not be optimal due to an inadequate volume of blood received in culture bottles   Culture NO GROWTH < 24 HOURS    Report Status PENDING   I-Stat CG4 Lactic Acid, ED  (not at  [facility])     Status: None   Collection Time: 05/17/17  9:23 PM  Result  Value Ref Range   Lactic Acid, Venous 1.45 0.5 - 1.9 mmol/L  Urinalysis, Routine w reflex microscopic     Status: Abnormal   Collection Time: 05/18/17  3:00 AM  Result Value Ref Range   Color, Urine BROWN (A) YELLOW    Comment: BIOCHEMICALS MAY BE AFFECTED BY COLOR   APPearance CLOUDY (A) CLEAR   Specific Gravity, Urine >1.030 (H) 1.005 - 1.030   pH 5.0 5.0 - 8.0   Glucose, UA NEGATIVE NEGATIVE mg/dL   Hgb urine dipstick NEGATIVE NEGATIVE   Bilirubin Urine SMALL (A) NEGATIVE   Ketones, ur TRACE (A) NEGATIVE mg/dL   Protein, ur 30 (A) NEGATIVE mg/dL   Nitrite NEGATIVE NEGATIVE   Leukocytes, UA NEGATIVE NEGATIVE  Urinalysis, Microscopic (reflex)     Status: Abnormal   Collection Time: 05/18/17  3:00 AM  Result Value Ref Range   RBC / HPF NONE SEEN 0 - 5 RBC/hpf   WBC, UA 0-5 0 - 5 WBC/hpf   Bacteria, UA MANY (A) NONE SEEN   Squamous Epithelial / LPF NONE SEEN NONE SEEN  CBC     Status: Abnormal   Collection Time: 05/18/17 10:26 AM  Result Value Ref Range   WBC 10.6 (H) 4.0 - 10.5 K/uL   RBC 2.82 (L) 4.22 - 5.81 MIL/uL   Hemoglobin 8.5 (L) 13.0 - 17.0 g/dL   HCT 26.8 (L) 39.0 - 52.0 %   MCV 95.0 78.0 - 100.0 fL   MCH 30.1 26.0 - 34.0 pg   MCHC 31.7 30.0 - 36.0 g/dL   RDW 17.1 (H) 11.5 - 15.5 %   Platelets 306 150 - 400 K/uL  Comprehensive metabolic panel     Status: Abnormal   Collection Time: 05/18/17 10:26 AM  Result Value Ref Range   Sodium 137 135 - 145 mmol/L   Potassium 3.4 (L) 3.5 - 5.1 mmol/L   Chloride 102 101 - 111 mmol/L   CO2 25 22 - 32 mmol/L   Glucose, Bld 145 (H) 65 - 99 mg/dL   BUN 35 (H) 6 - 20 mg/dL   Creatinine, Ser 5.35 (H) 0.61 - 1.24 mg/dL   Calcium 7.6 (L) 8.9 - 10.3 mg/dL   Total Protein 5.3 (L) 6.5 - 8.1  g/dL   Albumin 2.0 (L) 3.5 - 5.0 g/dL   AST 29 15 - 41 U/L   ALT 15 (L) 17 - 63 U/L   Alkaline Phosphatase 92 38 - 126 U/L   Total Bilirubin 0.9 0.3 - 1.2 mg/dL   GFR calc non Af Amer 10 (L) >60 mL/min   GFR calc Af Amer 11 (L) >60 mL/min     Comment: (NOTE) The eGFR has been calculated using the CKD EPI equation. This calculation has not been validated in all clinical situations. eGFR's persistently <60 mL/min signify possible Chronic Kidney Disease.    Anion gap 10 5 - 15  C difficile quick scan w PCR reflex     Status: None   Collection Time: 05/18/17 12:00 PM  Result Value Ref Range   C Diff antigen NEGATIVE NEGATIVE   C Diff toxin NEGATIVE NEGATIVE   C Diff interpretation No C. difficile detected.   CBC     Status: Abnormal   Collection Time: 05/19/17  6:50 AM  Result Value Ref Range   WBC 10.1 4.0 - 10.5 K/uL   RBC 2.42 (L) 4.22 - 5.81 MIL/uL   Hemoglobin 7.2 (L) 13.0 - 17.0 g/dL   HCT 22.5 (L) 39.0 - 52.0 %   MCV 93.0 78.0 - 100.0 fL   MCH 29.8 26.0 - 34.0 pg   MCHC 32.0 30.0 - 36.0 g/dL   RDW 17.1 (H) 11.5 - 15.5 %   Platelets 240 150 - 400 K/uL  Renal function panel     Status: Abnormal   Collection Time: 05/19/17  6:53 AM  Result Value Ref Range   Sodium 138 135 - 145 mmol/L   Potassium 2.7 (LL) 3.5 - 5.1 mmol/L    Comment: CRITICAL RESULT CALLED TO, READ BACK BY AND VERIFIED WITH: HAWKINS,M AT 7:45AM ON 05/19/17 BY FESTERMAN,C    Chloride 103 101 - 111 mmol/L   CO2 26 22 - 32 mmol/L   Glucose, Bld 78 65 - 99 mg/dL   BUN 39 (H) 6 - 20 mg/dL   Creatinine, Ser 5.65 (H) 0.61 - 1.24 mg/dL   Calcium 7.5 (L) 8.9 - 10.3 mg/dL   Phosphorus 4.8 (H) 2.5 - 4.6 mg/dL   Albumin 1.8 (L) 3.5 - 5.0 g/dL   GFR calc non Af Amer 9 (L) >60 mL/min   GFR calc Af Amer 11 (L) >60 mL/min    Comment: (NOTE) The eGFR has been calculated using the CKD EPI equation. This calculation has not been validated in all clinical situations. eGFR's persistently <60 mL/min signify possible Chronic Kidney Disease.    Anion gap 9 5 - 15    ABGS No results for input(s): PHART, PO2ART, TCO2, HCO3 in the last 72 hours.  Invalid input(s): PCO2 CULTURES Recent Results (from the past 240 hour(s))  Blood culture (routine x 2)      Status: None (Preliminary result)   Collection Time: 05/17/17  4:10 PM  Result Value Ref Range Status   Specimen Description BLOOD LEFT ARM  Final   Special Requests Blood Culture adequate volume  Final   Culture NO GROWTH < 24 HOURS  Final   Report Status PENDING  Incomplete  Blood culture (routine x 2)     Status: None (Preliminary result)   Collection Time: 05/17/17  4:29 PM  Result Value Ref Range Status   Specimen Description BLOOD LEFT HAND  Final   Special Requests   Final    BOTTLES DRAWN AEROBIC ONLY Blood  Culture results may not be optimal due to an inadequate volume of blood received in culture bottles   Culture NO GROWTH < 24 HOURS  Final   Report Status PENDING  Incomplete  C difficile quick scan w PCR reflex     Status: None   Collection Time: 05/18/17 12:00 PM  Result Value Ref Range Status   C Diff antigen NEGATIVE NEGATIVE Final   C Diff toxin NEGATIVE NEGATIVE Final   C Diff interpretation No C. difficile detected.  Final   Studies/Results: Ct Abdomen Pelvis Wo Contrast  Result Date: 05/17/2017 CLINICAL DATA:  Colectomy May 04, 2017. Diarrhea, fever, and vomiting today. No pain. EXAM: CT ABDOMEN AND PELVIS WITHOUT CONTRAST TECHNIQUE: Multidetector CT imaging of the abdomen and pelvis was performed following the standard protocol without IV contrast. COMPARISON:  May 09, 2017 FINDINGS: Lower chest: Coronary artery calcifications are identified. Dependent atelectasis is seen bilaterally without suspicious infiltrate, nodule, or mass. Hepatobiliary: No focal liver abnormality is seen. No gallstones, gallbladder wall thickening, or biliary dilatation. Pancreas: Unremarkable. No pancreatic ductal dilatation or surrounding inflammatory changes. Spleen: Normal in size without focal abnormality. Adrenals/Urinary Tract: Adrenal glands are normal. A left renal cyst is identified. There is a calcification in the wall of the left renal cyst. No renal obstruction. The bladder is thick  walled and poorly distended. Stomach/Bowel: The patient is status post colectomy with an ilealcolic anastomosis in the pelvis. Mild gastric distention persists but is improved in the interval. The proximal small bowel is relatively decompressed. Small bowel loops in the left lateral abdomen on series 2, image 43 may be thick walled but are poorly distended or evaluated without oral contrast. No pneumatosis. More distally, the small bowel is distended measuring up to 4 cm in caliber. There appears to be rectal wall thickening distal to the anastomosis, best appreciated on coronal image 79. The rectum is poorly distended however somewhat limiting evaluation. Mild thickening of the distal ileum, just proximal to the anastomosis persists is unchanged on axial image 77. Vascular/Lymphatic: Calcified atherosclerosis is associated with the non aneurysmal aorta and iliac vessels. No adenopathy. Reproductive: Prostate is unremarkable. Other: Subcutaneous gas is likely from recent injections or recent surgery. No associated fluid collection or soft tissue stranding. No free air identified. Ascites remains, particularly in the upper abdomen adjacent to the liver and spleen. Musculoskeletal: No acute bony abnormalities. IMPRESSION: 1. There appears to be wall thickening associated with the distal ileum, just proximal to the anastomosis in the pelvis. There also appears to be wall thickening associated with the rectum. A few loops of jejunum in in the lateral left abdomen may demonstrate mild wall thickening as well. These regions of wall thickening suggest an inflammatory or infectious process. The proximal small bowel is decompressed although the distal half of the small bowel is mildly dilated without a discrete site of obstruction identified. 2. Ascites.  Free air has resolved. 3. Atherosclerosis. Electronically Signed   By: Dorise Bullion III M.D   On: 05/17/2017 17:42   Dg Chest 1 View  Result Date:  05/17/2017 CLINICAL DATA:  Fever.  Sepsis. EXAM: CHEST 1 VIEW COMPARISON:  December 26, 2016 FINDINGS: The study is limited due to a low volume portable technique. No pneumothorax. No nodules or masses. Increased density in the right infrahilar region could represent vascular crowding but a superimposed subtle infiltrate is not excluded. A PA and lateral chest x-ray may better evaluate. No other acute abnormalities are identified. IMPRESSION: 1. Increased density in  the right infrahilar region could represent vascular crowding versus a subtle superimposed infiltrate. A PA and lateral chest x-ray may better evaluate. Electronically Signed   By: Dorise Bullion III M.D   On: 05/17/2017 17:01   Dg Chest 2 View  Result Date: 05/17/2017 CLINICAL DATA:  Fever.  Sepsis. EXAM: CHEST  2 VIEW COMPARISON:  Single AP view earlier this day. Lung bases from abdominal CT earlier this day. FINDINGS: Low lung volumes. Bibasilar atelectasis, including the right infrahilar opacity seen on prior exam. No confluent consolidation. Normal heart size and mediastinal contours. No pulmonary edema. No pneumothorax or pleural effusion. No acute osseous abnormalities. IMPRESSION: Bibasilar atelectasis, including right infrahilar opacity on prior radiograph. No consolidation to suggest pneumonia. Low lung volumes persist. Electronically Signed   By: Jeb Levering M.D.   On: 05/17/2017 18:15    Medications: I have reviewed the patient's current medications.  Assesment:  Principal Problem:   Sepsis ([place]) Active Problems:   Diabetes mellitus ([place])   Mental retardation   End stage renal disease ([place])   Anemia in CKD (chronic kidney disease)   Colitis  hypokalemia   Plan:  Medications reviewed Will continue IV antibiotics Hemodialysis Continue current treatment   LOS: 2 days   Fleta Borgeson 05/19/2017, 8:25 AM

## 2017-05-20 LAB — RENAL FUNCTION PANEL
Albumin: 1.8 g/dL — ABNORMAL LOW (ref 3.5–5.0)
Anion gap: 10 (ref 5–15)
BUN: 27 mg/dL — ABNORMAL HIGH (ref 6–20)
CO2: 27 mmol/L (ref 22–32)
Calcium: 7.4 mg/dL — ABNORMAL LOW (ref 8.9–10.3)
Chloride: 103 mmol/L (ref 101–111)
Creatinine, Ser: 4.31 mg/dL — ABNORMAL HIGH (ref 0.61–1.24)
GFR calc Af Amer: 15 mL/min — ABNORMAL LOW (ref >60)
GFR calc non Af Amer: 13 mL/min — ABNORMAL LOW (ref >60)
Glucose, Bld: 136 mg/dL — ABNORMAL HIGH (ref 65–99)
Phosphorus: 3.6 mg/dL (ref 2.5–4.6)
Potassium: 3.1 mmol/L — ABNORMAL LOW (ref 3.5–5.1)
Sodium: 140 mmol/L (ref 135–145)

## 2017-05-20 LAB — CBC
HCT: 23.8 % — ABNORMAL LOW (ref 39.0–52.0)
Hemoglobin: 7.6 g/dL — ABNORMAL LOW (ref 13.0–17.0)
MCH: 29.9 pg (ref 26.0–34.0)
MCHC: 31.9 g/dL (ref 30.0–36.0)
MCV: 93.7 fL (ref 78.0–100.0)
Platelets: 258 10*3/uL (ref 150–400)
RBC: 2.54 MIL/uL — ABNORMAL LOW (ref 4.22–5.81)
RDW: 17.5 % — ABNORMAL HIGH (ref 11.5–15.5)
WBC: 8.9 10*3/uL (ref 4.0–10.5)

## 2017-05-20 MED ORDER — POTASSIUM CHLORIDE 20 MEQ PO PACK
20.0000 meq | PACK | Freq: Once | ORAL | Status: AC
  Administered 2017-05-20: 20 meq via ORAL
  Filled 2017-05-20: qty 1

## 2017-05-20 NOTE — Progress Notes (Signed)
Pharmacy Antibiotic Note  Zachary [name] is a 71 y.o. male admitted on 05/17/2017 with sepsis.  Pharmacy has been consulted for zosyn dosing.  Plan: Zosyn 3.375 gm IV q12 hours Duration of therapy per MD F/u cultures and clinical course  Height: 6' (182.9 cm) Weight: 190 lb 1.6 oz (86.2 kg) IBW/kg (Calculated) : 77.6  Temp (24hrs), Avg:98.6 F (37 C), Min:98 F (36.7 C), Max:99.1 F (37.3 C)   Recent Labs Lab 05/17/17 1557 05/17/17 1558 05/17/17 2123 05/18/17 1026 05/19/17 0650 05/19/17 0653 05/20/17 0631  WBC  --  10.4  --  10.6* 10.1  --  8.9  CREATININE  --  4.49*  --  5.35*  --  5.65* 4.31*  LATICACIDVEN 3.15*  --  1.45  --   --   --   --     Estimated Creatinine Clearance: 18 mL/min (A) (by C-G formula based on SCr of 4.31 mg/dL (H)).    No Known Allergies  Thank you for allowing pharmacy to be a part of this patient's care.  Hart Robinsons A 05/20/2017 12:15 PM

## 2017-05-20 NOTE — Progress Notes (Signed)
Zachary [name]  MRN: [id number]  DOB/AGE: 01-Jul-1946 71 y.o.  Primary Care Physician:Fanta, Tesfaye, MD  Admit date: 05/17/2017  Chief Complaint:  Chief Complaint  Patient presents with  . Fever  . Diarrhea    S-Pt presented on  05/17/2017 with  Chief Complaint  Patient presents with  . Fever  . Diarrhea  .    Pt offers no new complaints.   Meds . atorvastatin  40 mg Oral QPM  . enoxaparin  85 mg Subcutaneous Q24H  . epoetin (EPOGEN/PROCRIT) injection  10,000 Units Intravenous Q T,Th,Sa-HD  . levothyroxine  75 mcg Oral QAC breakfast  . multivitamin with minerals  1 tablet Oral QPM  . pantoprazole  40 mg Oral Daily  . potassium chloride  20 mEq Oral Once  . sodium chloride flush  3 mL Intravenous Q12H  . tamsulosin  0.4 mg Oral Q2000     Physical Exam: Vital signs in last 24 hours: Temp:  [98 F (36.7 C)-99.1 F (37.3 C)] 99 F (37.2 C) (07/25 0421) Pulse Rate:  [81-102] 81 (07/25 0421) Resp:  [16-20] 20 (07/25 0421) BP: (98-106)/(50-61) 98/50 (07/25 0421) SpO2:  [99 %] 99 % (07/25 0421) Weight:  [188 lb 11.4 oz (85.6 kg)-191 lb 1.6 oz (86.7 kg)] 190 lb 1.6 oz (86.2 kg) (07/25 0600) Weight change:  Last BM Date: 05/19/17  Intake/Output from previous day: 07/24 0701 - 07/25 0700 In: 240 [P.O.:240] Out: 2400 [Urine:400] No intake/output data recorded.   Physical Exam: General- pt is awake, follow commands Resp- No acute REsp distress, NO Rhonchi CVS- S1S2 regular in rate and rhythm GIT- BS  present+,soft ,Distended+ EXT- NO LE Edema, NO Cyanosis Access- AVF +  Lab Results: CBC  Recent Labs  05/19/17 0650 05/20/17 0631  WBC 10.1 8.9  HGB 7.2* 7.6*  HCT 22.5* 23.8*  PLT 240 258    BMET  Recent Labs  05/19/17 0653 05/20/17 0631  NA 138 140  K 2.7* 3.1*  CL 103 103  CO2 26 27  GLUCOSE 78 136*  BUN 39* 27*  CREATININE 5.65* 4.31*  CALCIUM 7.5* 7.4*     MICRO Recent Results (from the past 240 hour(s))  Blood culture (routine x 2)      Status: None (Preliminary result)   Collection Time: 05/17/17  4:10 PM  Result Value Ref Range Status   Specimen Description BLOOD LEFT ARM  Final   Special Requests Blood Culture adequate volume  Final   Culture NO GROWTH 3 DAYS  Final   Report Status PENDING  Incomplete  Blood culture (routine x 2)     Status: None (Preliminary result)   Collection Time: 05/17/17  4:29 PM  Result Value Ref Range Status   Specimen Description BLOOD LEFT HAND  Final   Special Requests   Final    BOTTLES DRAWN AEROBIC ONLY Blood Culture results may not be optimal due to an inadequate volume of blood received in culture bottles   Culture NO GROWTH 3 DAYS  Final   Report Status PENDING  Incomplete  C difficile quick scan w PCR reflex     Status: None   Collection Time: 05/18/17 12:00 PM  Result Value Ref Range Status   C Diff antigen NEGATIVE NEGATIVE Final   C Diff toxin NEGATIVE NEGATIVE Final   C Diff interpretation No C. difficile detected.  Final      Lab Results  Component Value Date   PTH 61 10/09/2015   CALCIUM 7.4 (L) 05/20/2017  CAION 1.24 10/08/2015   PHOS 3.6 05/20/2017                Impression: 1)Renal  ESRD on HD                Pt is on TTS schedule as outpt                Pt was dialyzed yesterday.                No need of HD today  2)HTN  BP stable  3)Anemia IN ESRD the goal for HGB is 9-11. hgb not at goal on EPO during hd.   4)CKD Mineral-Bone Disorder Calcium when corrected for low albumin is at goal Phosphorus now  at goal.    5)CNS-Hx of mental Retardation  PMD following  6)Electrolytes  Hyokalemic  better Normonatremia    7)Acid base Co2  at goal.  8) GI-pt is s/p total colectomy Clinically better Sx following   Plan:  Will replete K gently. No need of HD today.     Tiffany Talarico S 05/20/2017, 10:25 AM

## 2017-05-20 NOTE — Progress Notes (Signed)
Subjective: Patient feels better. His appetite is improving. He is on IV antibiotics. [place] his culture showed no growth.  Objective: Vital signs in last 24 hours: Temp:  [98 F (36.7 C)-99.1 F (37.3 C)] 99 F (37.2 C) (07/25 0421) Pulse Rate:  [81-102] 81 (07/25 0421) Resp:  [16-20] 20 (07/25 0421) BP: (98-106)/(50-61) 98/50 (07/25 0421) SpO2:  [99 %] 99 % (07/25 0421) Weight:  [85.6 kg (188 lb 11.4 oz)-86.7 kg (191 lb 1.6 oz)] 86.2 kg (190 lb 1.6 oz) (07/25 0600) Weight change:  Last BM Date: 05/19/17  Intake/Output from previous day: 07/24 0701 - 07/25 0700 In: 240 [P.O.:240] Out: 2400 [Urine:400]  PHYSICAL EXAM General appearance: no distress and slowed mentation Resp: clear to auscultation bilaterally Cardio: S1, S2 normal GI: soft, non-tender; bowel sounds normal; no masses,  no organomegaly Extremities: extremities normal, atraumatic, no cyanosis or edema  Lab Results:  Results for orders placed or performed during the hospital encounter of 05/17/17 (from the past 48 hour(s))  CBC     Status: Abnormal   Collection Time: 05/18/17 10:26 AM  Result Value Ref Range   WBC 10.6 (H) 4.0 - 10.5 K/uL   RBC 2.82 (L) 4.22 - 5.81 MIL/uL   Hemoglobin 8.5 (L) 13.0 - 17.0 g/dL   HCT 26.8 (L) 39.0 - 52.0 %   MCV 95.0 78.0 - 100.0 fL   MCH 30.1 26.0 - 34.0 pg   MCHC 31.7 30.0 - 36.0 g/dL   RDW 17.1 (H) 11.5 - 15.5 %   Platelets 306 150 - 400 K/uL  Comprehensive metabolic panel     Status: Abnormal   Collection Time: 05/18/17 10:26 AM  Result Value Ref Range   Sodium 137 135 - 145 mmol/L   Potassium 3.4 (L) 3.5 - 5.1 mmol/L   Chloride 102 101 - 111 mmol/L   CO2 25 22 - 32 mmol/L   Glucose, Bld 145 (H) 65 - 99 mg/dL   BUN 35 (H) 6 - 20 mg/dL   Creatinine, Ser 5.35 (H) 0.61 - 1.24 mg/dL   Calcium 7.6 (L) 8.9 - 10.3 mg/dL   Total Protein 5.3 (L) 6.5 - 8.1 g/dL   Albumin 2.0 (L) 3.5 - 5.0 g/dL   AST 29 15 - 41 U/L   ALT 15 (L) 17 - 63 U/L   Alkaline Phosphatase 92 38 -  126 U/L   Total Bilirubin 0.9 0.3 - 1.2 mg/dL   GFR calc non Af Amer 10 (L) >60 mL/min   GFR calc Af Amer 11 (L) >60 mL/min    Comment: (NOTE) The eGFR has been calculated using the CKD EPI equation. This calculation has not been validated in all clinical situations. eGFR's persistently <60 mL/min signify possible Chronic Kidney Disease.    Anion gap 10 5 - 15  C difficile quick scan w PCR reflex     Status: None   Collection Time: 05/18/17 12:00 PM  Result Value Ref Range   C Diff antigen NEGATIVE NEGATIVE   C Diff toxin NEGATIVE NEGATIVE   C Diff interpretation No C. difficile detected.   CBC     Status: Abnormal   Collection Time: 05/19/17  6:50 AM  Result Value Ref Range   WBC 10.1 4.0 - 10.5 K/uL   RBC 2.42 (L) 4.22 - 5.81 MIL/uL   Hemoglobin 7.2 (L) 13.0 - 17.0 g/dL   HCT 22.5 (L) 39.0 - 52.0 %   MCV 93.0 78.0 - 100.0 fL   MCH 29.8 26.0 -  34.0 pg   MCHC 32.0 30.0 - 36.0 g/dL   RDW 17.1 (H) 11.5 - 15.5 %   Platelets 240 150 - 400 K/uL  Renal function panel     Status: Abnormal   Collection Time: 05/19/17  6:53 AM  Result Value Ref Range   Sodium 138 135 - 145 mmol/L   Potassium 2.7 (LL) 3.5 - 5.1 mmol/L    Comment: CRITICAL RESULT CALLED TO, READ BACK BY AND VERIFIED WITH: HAWKINS,M AT 7:45AM ON 05/19/17 BY FESTERMAN,C    Chloride 103 101 - 111 mmol/L   CO2 26 22 - 32 mmol/L   Glucose, Bld 78 65 - 99 mg/dL   BUN 39 (H) 6 - 20 mg/dL   Creatinine, Ser 5.65 (H) 0.61 - 1.24 mg/dL   Calcium 7.5 (L) 8.9 - 10.3 mg/dL   Phosphorus 4.8 (H) 2.5 - 4.6 mg/dL   Albumin 1.8 (L) 3.5 - 5.0 g/dL   GFR calc non Af Amer 9 (L) >60 mL/min   GFR calc Af Amer 11 (L) >60 mL/min    Comment: (NOTE) The eGFR has been calculated using the CKD EPI equation. This calculation has not been validated in all clinical situations. eGFR's persistently <60 mL/min signify possible Chronic Kidney Disease.    Anion gap 9 5 - 15  CBC     Status: Abnormal   Collection Time: 05/20/17  6:31 AM   Result Value Ref Range   WBC 8.9 4.0 - 10.5 K/uL   RBC 2.54 (L) 4.22 - 5.81 MIL/uL   Hemoglobin 7.6 (L) 13.0 - 17.0 g/dL   HCT 23.8 (L) 39.0 - 52.0 %   MCV 93.7 78.0 - 100.0 fL   MCH 29.9 26.0 - 34.0 pg   MCHC 31.9 30.0 - 36.0 g/dL   RDW 17.5 (H) 11.5 - 15.5 %   Platelets 258 150 - 400 K/uL  Renal function panel     Status: Abnormal   Collection Time: 05/20/17  6:31 AM  Result Value Ref Range   Sodium 140 135 - 145 mmol/L   Potassium 3.1 (L) 3.5 - 5.1 mmol/L   Chloride 103 101 - 111 mmol/L   CO2 27 22 - 32 mmol/L   Glucose, Bld 136 (H) 65 - 99 mg/dL   BUN 27 (H) 6 - 20 mg/dL   Creatinine, Ser 4.31 (H) 0.61 - 1.24 mg/dL   Calcium 7.4 (L) 8.9 - 10.3 mg/dL   Phosphorus 3.6 2.5 - 4.6 mg/dL   Albumin 1.8 (L) 3.5 - 5.0 g/dL   GFR calc non Af Amer 13 (L) >60 mL/min   GFR calc Af Amer 15 (L) >60 mL/min    Comment: (NOTE) The eGFR has been calculated using the CKD EPI equation. This calculation has not been validated in all clinical situations. eGFR's persistently <60 mL/min signify possible Chronic Kidney Disease.    Anion gap 10 5 - 15    ABGS No results for input(s): PHART, PO2ART, TCO2, HCO3 in the last 72 hours.  Invalid input(s): PCO2 CULTURES Recent Results (from the past 240 hour(s))  Blood culture (routine x 2)     Status: None (Preliminary result)   Collection Time: 05/17/17  4:10 PM  Result Value Ref Range Status   Specimen Description BLOOD LEFT ARM  Final   Special Requests Blood Culture adequate volume  Final   Culture NO GROWTH 2 DAYS  Final   Report Status PENDING  Incomplete  Blood culture (routine x 2)     Status:  None (Preliminary result)   Collection Time: 05/17/17  4:29 PM  Result Value Ref Range Status   Specimen Description BLOOD LEFT HAND  Final   Special Requests   Final    BOTTLES DRAWN AEROBIC ONLY Blood Culture results may not be optimal due to an inadequate volume of blood received in culture bottles   Culture NO GROWTH 2 DAYS  Final    Report Status PENDING  Incomplete  C difficile quick scan w PCR reflex     Status: None   Collection Time: 05/18/17 12:00 PM  Result Value Ref Range Status   C Diff antigen NEGATIVE NEGATIVE Final   C Diff toxin NEGATIVE NEGATIVE Final   C Diff interpretation No C. difficile detected.  Final   Studies/Results: No results found.  Medications: I have reviewed the patient's current medications.  Assesment:  Principal Problem:   Sepsis ([place]) Active Problems:   Diabetes mellitus ([place])   Mental retardation   End stage renal disease ([place])   Anemia in CKD (chronic kidney disease)   Colitis    Plan:  Medications reviewed Will continue IV antibiotics Will follow culture results. Surgical and nephrology consult appreciated    LOS: 3 days   Azaya Goedde 05/20/2017, 8:18 AM

## 2017-05-20 NOTE — Care Management Note (Signed)
Case Management Note  Patient Details  Name: Zachary [name] MRN: [id number] Date of Birth: [birth year]  Subjective/Objective:                  Admitted with sepsis. Pt from [facility] as LTR. CSW aware.   Action/Plan: Pt will return to [facility] at DC. CSW to make arrangements for return to facility. No CM needs anticipated. Will follow to DC.   Expected Discharge Date:      05/20/2017            Expected Discharge Plan:  [place]  In-House Referral:  Clinical Social Work  Discharge planning Services  CM Consult  Post Acute Care Choice:  NA Choice offered to:  NA  Status of Service:  Completed, signed off  Sherald Barge, RN 05/20/2017, 3:57 PM

## 2017-05-21 ENCOUNTER — Inpatient Hospital Stay
Admission: RE | Admit: 2017-05-21 | Discharge: 2017-05-30 | Disposition: A | Discharge status: Other Acute Inpatient Hospital | Payer: Medicare Other | Source: Ambulatory Visit | Attending: Internal Medicine | Admitting: Internal Medicine

## 2017-05-21 DIAGNOSIS — R509 Fever, unspecified: Secondary | ICD-10-CM | POA: Diagnosis not present

## 2017-05-21 DIAGNOSIS — E039 Hypothyroidism, unspecified: Secondary | ICD-10-CM | POA: Diagnosis present

## 2017-05-21 DIAGNOSIS — Z86718 Personal history of other venous thrombosis and embolism: Secondary | ICD-10-CM | POA: Diagnosis not present

## 2017-05-21 DIAGNOSIS — Z992 Dependence on renal dialysis: Secondary | ICD-10-CM | POA: Diagnosis not present

## 2017-05-21 DIAGNOSIS — N186 End stage renal disease: Secondary | ICD-10-CM | POA: Diagnosis not present

## 2017-05-21 DIAGNOSIS — N401 Enlarged prostate with lower urinary tract symptoms: Secondary | ICD-10-CM | POA: Diagnosis not present

## 2017-05-21 DIAGNOSIS — N400 Enlarged prostate without lower urinary tract symptoms: Secondary | ICD-10-CM | POA: Diagnosis present

## 2017-05-21 DIAGNOSIS — A4189 Other specified sepsis: Secondary | ICD-10-CM | POA: Diagnosis not present

## 2017-05-21 DIAGNOSIS — M898X9 Other specified disorders of bone, unspecified site: Secondary | ICD-10-CM | POA: Diagnosis present

## 2017-05-21 DIAGNOSIS — N2581 Secondary hyperparathyroidism of renal origin: Secondary | ICD-10-CM | POA: Diagnosis not present

## 2017-05-21 DIAGNOSIS — R740 Nonspecific elevation of levels of transaminase and lactic acid dehydrogenase [LDH]: Secondary | ICD-10-CM | POA: Diagnosis not present

## 2017-05-21 DIAGNOSIS — I82401 Acute embolism and thrombosis of unspecified deep veins of right lower extremity: Secondary | ICD-10-CM | POA: Diagnosis not present

## 2017-05-21 DIAGNOSIS — N3289 Other specified disorders of bladder: Secondary | ICD-10-CM | POA: Diagnosis not present

## 2017-05-21 DIAGNOSIS — J9811 Atelectasis: Secondary | ICD-10-CM | POA: Diagnosis present

## 2017-05-21 DIAGNOSIS — E43 Unspecified severe protein-calorie malnutrition: Secondary | ICD-10-CM | POA: Diagnosis present

## 2017-05-21 DIAGNOSIS — S72001S Fracture of unspecified part of neck of right femur, sequela: Secondary | ICD-10-CM | POA: Diagnosis not present

## 2017-05-21 DIAGNOSIS — E785 Hyperlipidemia, unspecified: Secondary | ICD-10-CM | POA: Diagnosis present

## 2017-05-21 DIAGNOSIS — R110 Nausea: Secondary | ICD-10-CM | POA: Diagnosis not present

## 2017-05-21 DIAGNOSIS — Z9181 History of falling: Secondary | ICD-10-CM | POA: Diagnosis not present

## 2017-05-21 DIAGNOSIS — F70 Mild intellectual disabilities: Secondary | ICD-10-CM | POA: Diagnosis not present

## 2017-05-21 DIAGNOSIS — D509 Iron deficiency anemia, unspecified: Secondary | ICD-10-CM | POA: Diagnosis not present

## 2017-05-21 DIAGNOSIS — Z7901 Long term (current) use of anticoagulants: Secondary | ICD-10-CM | POA: Diagnosis not present

## 2017-05-21 DIAGNOSIS — E1129 Type 2 diabetes mellitus with other diabetic kidney complication: Secondary | ICD-10-CM | POA: Diagnosis not present

## 2017-05-21 DIAGNOSIS — Z79899 Other long term (current) drug therapy: Secondary | ICD-10-CM | POA: Diagnosis not present

## 2017-05-21 DIAGNOSIS — I10 Essential (primary) hypertension: Secondary | ICD-10-CM | POA: Diagnosis not present

## 2017-05-21 DIAGNOSIS — M6281 Muscle weakness (generalized): Secondary | ICD-10-CM | POA: Diagnosis not present

## 2017-05-21 DIAGNOSIS — E1122 Type 2 diabetes mellitus with diabetic chronic kidney disease: Secondary | ICD-10-CM | POA: Diagnosis present

## 2017-05-21 DIAGNOSIS — R279 Unspecified lack of coordination: Secondary | ICD-10-CM | POA: Diagnosis not present

## 2017-05-21 DIAGNOSIS — K635 Polyp of colon: Secondary | ICD-10-CM | POA: Diagnosis not present

## 2017-05-21 DIAGNOSIS — K219 Gastro-esophageal reflux disease without esophagitis: Secondary | ICD-10-CM | POA: Diagnosis present

## 2017-05-21 DIAGNOSIS — D62 Acute posthemorrhagic anemia: Secondary | ICD-10-CM | POA: Diagnosis not present

## 2017-05-21 DIAGNOSIS — A419 Sepsis, unspecified organism: Secondary | ICD-10-CM | POA: Diagnosis not present

## 2017-05-21 DIAGNOSIS — K529 Noninfective gastroenteritis and colitis, unspecified: Secondary | ICD-10-CM | POA: Diagnosis not present

## 2017-05-21 DIAGNOSIS — S72421D Displaced fracture of lateral condyle of right femur, subsequent encounter for closed fracture with routine healing: Secondary | ICD-10-CM | POA: Diagnosis not present

## 2017-05-21 DIAGNOSIS — F79 Unspecified intellectual disabilities: Secondary | ICD-10-CM | POA: Diagnosis present

## 2017-05-21 DIAGNOSIS — E871 Hypo-osmolality and hyponatremia: Secondary | ICD-10-CM | POA: Diagnosis present

## 2017-05-21 DIAGNOSIS — Z9049 Acquired absence of other specified parts of digestive tract: Secondary | ICD-10-CM | POA: Diagnosis not present

## 2017-05-21 DIAGNOSIS — Z9115 Patient's noncompliance with renal dialysis: Secondary | ICD-10-CM | POA: Diagnosis not present

## 2017-05-21 DIAGNOSIS — R197 Diarrhea, unspecified: Secondary | ICD-10-CM | POA: Diagnosis not present

## 2017-05-21 DIAGNOSIS — D631 Anemia in chronic kidney disease: Secondary | ICD-10-CM | POA: Diagnosis not present

## 2017-05-21 DIAGNOSIS — E1121 Type 2 diabetes mellitus with diabetic nephropathy: Secondary | ICD-10-CM | POA: Diagnosis not present

## 2017-05-21 DIAGNOSIS — I120 Hypertensive chronic kidney disease with stage 5 chronic kidney disease or end stage renal disease: Secondary | ICD-10-CM | POA: Diagnosis not present

## 2017-05-21 DIAGNOSIS — Z6823 Body mass index (BMI) 23.0-23.9, adult: Secondary | ICD-10-CM | POA: Diagnosis not present

## 2017-05-21 DIAGNOSIS — D649 Anemia, unspecified: Secondary | ICD-10-CM | POA: Diagnosis not present

## 2017-05-21 MED ORDER — AMOXICILLIN-POT CLAVULANATE 500-125 MG PO TABS: 1.0000 | ORAL_TABLET | Freq: Two times a day (BID) | ORAL | 0 refills | Status: DC

## 2017-05-21 MED ORDER — EPOETIN ALFA 10000 UNIT/ML IJ SOLN
INTRAMUSCULAR | Status: AC
  Administered 2017-05-21: 10000 [IU] via INTRAVENOUS
  Filled 2017-05-21: qty 1

## 2017-05-21 NOTE — Care Management Obs Status (Signed)
[place] NOTIFICATION   Patient Details  Name: Zachary [name] MRN: [id number] Date of Birth: [birth year]   Medicare Observation Status Notification Given:  Other (see comment) (Pt discharged < 24hrs)    Sherald Barge, RN 05/21/2017, 9:35 AM

## 2017-05-21 NOTE — Care Management Important Message (Signed)
Important Message  Patient Details  Name: Zachary [name] MRN: [id number] Date of Birth: 08-01-1946   Medicare Important Message Given:  Yes    Sherald Barge, RN 05/21/2017, 9:16 AM

## 2017-05-21 NOTE — Discharge Summary (Signed)
Physician Discharge Summary  Patient ID: Zachary [name] MRN: [id number] DOB/AGE: Jun 13, 1946 71 y.o. Primary Care Physician:Santiago Stenzel, MD Admit date: 05/17/2017 Discharge date: 05/21/2017    Discharge Diagnoses:   Principal Problem:   Sepsis [facility]) Active Problems:   Diabetes mellitus ([place])   Mental retardation   End stage renal disease ([place])   Anemia in CKD (chronic kidney disease)   Colitis   Allergies as of 05/21/2017   No Known Allergies     Medication List    TAKE these medications   amoxicillin-clavulanate 500-125 MG tablet Commonly known as:  AUGMENTIN Take 1 tablet (500 mg total) by mouth 2 (two) times daily.   atorvastatin 40 MG tablet Commonly known as:  LIPITOR Take 40 mg by mouth every evening.   enoxaparin 100 MG/ML injection Commonly known as:  LOVENOX Inject 85 mg into the skin every 12 (twelve) hours.   feeding supplement (PRO-STAT 64) Liqd Take 30 mLs by mouth 2 (two) times daily between meals.   FIRST AID PLUS LIDOCAINE EX Apply 1 application topically Every Tuesday,Thursday,and Saturday with dialysis. Prior to dialysis   levothyroxine 75 MCG tablet Commonly known as:  SYNTHROID, LEVOTHROID Take 75 mcg by mouth daily before breakfast.   multivitamin with minerals Tabs tablet Take 1 tablet by mouth every evening.   omeprazole 40 MG capsule Commonly known as:  PRILOSEC Take 40 mg by mouth every evening.   tamsulosin 0.4 MG Caps capsule Commonly known as:  FLOMAX Take 0.4 mg by mouth daily at 8 pm. Give 30 minutes after a meal   torsemide 10 MG tablet Commonly known as:  DEMADEX Take 5 tablets (50 mg total) by mouth daily.       Discharged Condition: improved    Consults: nephrology, general surgery  Significant Diagnostic Studies: Ct Abdomen Pelvis Wo Contrast  Result Date: 05/17/2017 CLINICAL DATA:  Colectomy May 04, 2017. Diarrhea, fever, and vomiting today. No pain. EXAM: CT ABDOMEN AND PELVIS WITHOUT CONTRAST TECHNIQUE:  Multidetector CT imaging of the abdomen and pelvis was performed following the standard protocol without IV contrast. COMPARISON:  May 09, 2017 FINDINGS: Lower chest: Coronary artery calcifications are identified. Dependent atelectasis is seen bilaterally without suspicious infiltrate, nodule, or mass. Hepatobiliary: No focal liver abnormality is seen. No gallstones, gallbladder wall thickening, or biliary dilatation. Pancreas: Unremarkable. No pancreatic ductal dilatation or surrounding inflammatory changes. Spleen: Normal in size without focal abnormality. Adrenals/Urinary Tract: Adrenal glands are normal. A left renal cyst is identified. There is a calcification in the wall of the left renal cyst. No renal obstruction. The bladder is thick walled and poorly distended. Stomach/Bowel: The patient is status post colectomy with an ilealcolic anastomosis in the pelvis. Mild gastric distention persists but is improved in the interval. The proximal small bowel is relatively decompressed. Small bowel loops in the left lateral abdomen on series 2, image 43 may be thick walled but are poorly distended or evaluated without oral contrast. No pneumatosis. More distally, the small bowel is distended measuring up to 4 cm in caliber. There appears to be rectal wall thickening distal to the anastomosis, best appreciated on coronal image 79. The rectum is poorly distended however somewhat limiting evaluation. Mild thickening of the distal ileum, just proximal to the anastomosis persists is unchanged on axial image 77. Vascular/Lymphatic: Calcified atherosclerosis is associated with the non aneurysmal aorta and iliac vessels. No adenopathy. Reproductive: Prostate is unremarkable. Other: Subcutaneous gas is likely from recent injections or recent surgery. No associated fluid collection  or soft tissue stranding. No free air identified. Ascites remains, particularly in the upper abdomen adjacent to the liver and spleen.  Musculoskeletal: No acute bony abnormalities. IMPRESSION: 1. There appears to be wall thickening associated with the distal ileum, just proximal to the anastomosis in the pelvis. There also appears to be wall thickening associated with the rectum. A few loops of jejunum in in the lateral left abdomen may demonstrate mild wall thickening as well. These regions of wall thickening suggest an inflammatory or infectious process. The proximal small bowel is decompressed although the distal half of the small bowel is mildly dilated without a discrete site of obstruction identified. 2. Ascites.  Free air has resolved. 3. Atherosclerosis. Electronically Signed   By: Dorise Bullion III M.D   On: 05/17/2017 17:42   Ct Abdomen Pelvis Wo Contrast  Result Date: 05/09/2017 CLINICAL DATA:  71 year old male with history of recent colonoscopy and postprocedural bleeding. 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 EXAM: CT ABDOMEN AND PELVIS WITHOUT CONTRAST TECHNIQUE: Multidetector CT imaging of the abdomen and pelvis was performed following the standard protocol without IV contrast. COMPARISON:  CT of the abdomen and pelvis 03/10/2016. FINDINGS: Lower chest: There is aortic atherosclerosis, as well as the coronary arteries, including calcified atherosclerotic plaque in the left main, left anterior descending, left circumflex and right coronary arteries. Dependent opacities in the lower lobes of the lungs bilaterally, favored to predominantly reflect areas of subsegmental atelectasis, although sequela of aspiration is also suspected in the right lower lobe where there is likely some airspace consolidation. Trace bilateral pleural effusions. Hepatobiliary: No discrete cystic or solid hepatic lesions are confidently identified on today's noncontrast CT examination. Intermediate attenuation material lying dependently in the gallbladder likely reflects biliary sludge. No definite evidence of an acute cholecystitis at this time.  Pancreas: No definite pancreatic mass or peripancreatic inflammatory changes noted on today's noncontrast CT examination. Spleen: Unremarkable. Adrenals/Urinary Tract: 2 tiny nonobstructive calculi are noted within the renal collecting systems bilaterally, largest of which is in the interpolar region of the left kidney measuring 2 mm. 2.1 cm low-attenuation lesion in the interpolar region of the left kidney is incompletely characterized on today's noncontrast CT examination, but is statistically likely a cyst. Mild to moderate bilateral hydroureteronephrosis. Urinary bladder is mildly distended. Stomach/Bowel: Stomach is moderately distended. Multiple dilated loops of small bowel measuring up to 4.1 cm in diameter with multiple air-fluid levels. No discrete transition point between dilated and nondilated small bowel is identified. Patient is status post subtotal colectomy with ileal colicky anastomosis in the central anatomic pelvis. There does appear to be some thickening of the distal ileum, which may reflect some back wash ileitis. Vascular/Lymphatic: Aortic atherosclerosis, without definite aneurysm in the abdominal or pelvic vasculature. No lymphadenopathy noted in the abdomen or pelvis. 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 Seminal vesicles are unremarkable in appearance. Other: Trace volume of ascites. Small amount of pneumoperitoneum. Healing midline anterior abdominal wall wound with overlying skin staples in place. Musculoskeletal: There are no aggressive appearing lytic or blastic lesions noted in the visualized portions of the skeleton. IMPRESSION: 1. Postoperative changes of recent subtotal colectomy with what appears to be a postoperative ileus, and small amount of postoperative Sitz ascites and pneumoperitoneum. 2. Mild moderate bilateral hydroureteronephrosis. Urinary bladder is also moderately distended. This may reflect bladder outlet  obstruction from an enlarged prostate gland with median lobe hypertrophy. No other definite source of obstruction is identified.  3. Two tiny nonobstructive calculi are noted within both renal collecting systems measuring 1-2 mm in size. No definite ureteral stones are noted. 4. Biliary sludge in the gallbladder, without evidence to suggest an acute cholecystitis at this time. 5. Aortic atherosclerosis, in addition to left main and 3 vessel coronary artery disease. Please note that although the presence of coronary artery calcium documents the presence of coronary artery disease, the severity of this disease and any potential stenosis cannot be assessed on this non-gated CT examination. Assessment for potential risk factor modification, dietary therapy or pharmacologic therapy may be warranted, if clinically indicated. 6. Additional incidental findings, as above. Aortic Atherosclerosis (ICD10-I70.0). Electronically Signed   By: Vinnie Langton M.D.   On: 05/09/2017 18:07   Dg Chest 1 View  Result Date: 05/17/2017 CLINICAL DATA:  Fever.  Sepsis. EXAM: CHEST 1 VIEW COMPARISON:  December 26, 2016 FINDINGS: The study is limited due to a low volume portable technique. No pneumothorax. No nodules or masses. Increased density in the right infrahilar region could represent vascular crowding but a superimposed subtle infiltrate is not excluded. A PA and lateral chest x-ray may better evaluate. No other acute abnormalities are identified. IMPRESSION: 1. Increased density in the right infrahilar region could represent vascular crowding versus a subtle superimposed infiltrate. A PA and lateral chest x-ray may better evaluate. Electronically Signed   By: Dorise Bullion III M.D   On: 05/17/2017 17:01   Dg Chest 2 View  Result Date: 05/17/2017 CLINICAL DATA:  Fever.  Sepsis. EXAM: CHEST  2 VIEW COMPARISON:  Single AP view earlier this day. Lung bases from abdominal CT earlier this day. FINDINGS: Low lung volumes. Bibasilar  atelectasis, including the right infrahilar opacity seen on prior exam. No confluent consolidation. Normal heart size and mediastinal contours. No pulmonary edema. No pneumothorax or pleural effusion. No acute osseous abnormalities. IMPRESSION: Bibasilar atelectasis, including right infrahilar opacity on prior radiograph. No consolidation to suggest pneumonia. Low lung volumes persist. Electronically Signed   By: Jeb Levering M.D.   On: 05/17/2017 18:15   US Venous Img Lower Unilateral Right  Result Date: 05/01/2017 CLINICAL DATA:  History of deep venous thrombosis. Right lower extremity swelling and warmth. EXAM: RIGHT LOWER EXTREMITY VENOUS DOPPLER ULTRASOUND TECHNIQUE: Gray-scale sonography with graded compression, as well as color Doppler and duplex ultrasound were performed to evaluate the lower extremity deep venous systems from the level of the common femoral vein and including the common femoral, femoral, profunda femoral, popliteal and calf veins including the posterior tibial, peroneal and gastrocnemius veins when visible. The superficial great saphenous vein was also interrogated. Spectral Doppler was utilized to evaluate flow at rest and with distal augmentation maneuvers in the common femoral, femoral and popliteal veins. COMPARISON:  None. FINDINGS: Contralateral Common Femoral Vein: Respiratory phasicity is normal and symmetric with the symptomatic side. No evidence of thrombus. Normal compressibility. Common Femoral Vein: No evidence of thrombus. Normal compressibility, respiratory phasicity and response to augmentation. Saphenofemoral Junction: No evidence of thrombus. Normal compressibility and flow on color Doppler imaging. Profunda Femoral Vein: No evidence of thrombus. Normal compressibility and flow on color Doppler imaging. Femoral Vein: No evidence of thrombus. Normal compressibility, respiratory phasicity and response to augmentation. Popliteal Vein: No evidence of thrombus. Normal  compressibility, respiratory phasicity and response to augmentation. Calf Veins: No evidence of thrombus. Normal compressibility and flow on color Doppler imaging. Superficial Great Saphenous Vein: No evidence of thrombus. Normal compressibility and flow on color Doppler imaging. Venous Reflux:  None. Other Findings:  None. IMPRESSION: No evidence of DVT within the right lower extremity. Electronically Signed   By: Lajean Manes M.D.   On: 05/01/2017 14:27    Lab Results: Basic Metabolic Panel:  Recent Labs  05/19/17 0653 05/20/17 0631  NA 138 140  K 2.7* 3.1*  CL 103 103  CO2 26 27  GLUCOSE 78 136*  BUN 39* 27*  CREATININE 5.65* 4.31*  CALCIUM 7.5* 7.4*  PHOS 4.8* 3.6   Liver Function Tests:  Recent Labs  05/18/17 1026 05/19/17 0653 05/20/17 0631  AST 29  --   --   ALT 15*  --   --   ALKPHOS 92  --   --   BILITOT 0.9  --   --   PROT 5.3*  --   --   ALBUMIN 2.0* 1.8* 1.8*     CBC:  Recent Labs  05/19/17 0650 05/20/17 0631  WBC 10.1 8.9  HGB 7.2* 7.6*  HCT 22.5* 23.8*  MCV 93.0 93.7  PLT 240 258    Recent Results (from the past 240 hour(s))  Blood culture (routine x 2)     Status: None (Preliminary result)   Collection Time: 05/17/17  4:10 PM  Result Value Ref Range Status   Specimen Description BLOOD LEFT ARM  Final   Special Requests Blood Culture adequate volume  Final   Culture NO GROWTH 3 DAYS  Final   Report Status PENDING  Incomplete  Blood culture (routine x 2)     Status: None (Preliminary result)   Collection Time: 05/17/17  4:29 PM  Result Value Ref Range Status   Specimen Description BLOOD LEFT HAND  Final   Special Requests   Final    BOTTLES DRAWN AEROBIC ONLY Blood Culture results may not be optimal due to an inadequate volume of blood received in culture bottles   Culture NO GROWTH 3 DAYS  Final   Report Status PENDING  Incomplete  C difficile quick scan w PCR reflex     Status: None   Collection Time: 05/18/17 12:00 PM  Result Value  Ref Range Status   C Diff antigen NEGATIVE NEGATIVE Final   C Diff toxin NEGATIVE NEGATIVE Final   C Diff interpretation No C. difficile detected.  Final     Hospital Course:   This is a 71 years old male patient with history of multiple medical illnesses including recent colectomy for multiple polyps. Patient was in skilled nursing home when he developed fever. He had leucocytosis and elevated lactic acid. Patient was empirically started on IV antibiotics and surgical nephrology consult was done. Blood cultures and c.diffi toxins were negative. Patient received his regular dialysis. Patient improved. His fever has subsided. He is tolerating regular diet. He is being discharged to skilled nursing home on oral antibiotics.   Discharge Exam: Blood pressure 115/60, pulse (!) 104, temperature 99.1 F (37.3 C), temperature source Oral, resp. rate 20, height 6' (1.829 m), weight 86.4 kg (190 lb 8 oz), SpO2 99 %.    Disposition:  Skilled nursing home      Signed: Madicyn Mesina   05/21/2017, 8:16 AM

## 2017-05-21 NOTE — Progress Notes (Signed)
Subjective: Interval History: has no complaint  He denies any nausea or vomiting. Diarrhea is getting better. Objective: Vital signs in last 24 hours: Temp:  [98.8 F (37.1 C)-99.3 F (37.4 C)] 99.1 F (37.3 C) (07/26 0508) Pulse Rate:  [94-104] 104 (07/26 0508) Resp:  [19-20] 20 (07/26 0508) BP: (113-115)/(60-64) 115/60 (07/26 0508) SpO2:  [99 %-100 %] 99 % (07/26 0508) Weight:  [86.4 kg (190 lb 8 oz)] 86.4 kg (190 lb 8 oz) (07/26 0508) Weight change: -0.272 kg (-9.6 oz)  Intake/Output from previous day: 07/25 0701 - 07/26 0700 In: 916 [P.O.:240; I.V.:603; IV Piggyback:50] Out: 50 [Urine:50] Intake/Output this shift: No intake/output data recorded.  General appearance: alert, cooperative and no distress Resp: clear to auscultation bilaterally Cardio: regular rate and rhythm GI: Soft and nontender. He has positive bowel sounds. Extremities: No edema  Lab Results:  Recent Labs  05/19/17 0650 05/20/17 0631  WBC 10.1 8.9  HGB 7.2* 7.6*  HCT 22.5* 23.8*  PLT 240 258   BMET:   Recent Labs  05/19/17 0653 05/20/17 0631  NA 138 140  K 2.7* 3.1*  CL 103 103  CO2 26 27  GLUCOSE 78 136*  BUN 39* 27*  CREATININE 5.65* 4.31*  CALCIUM 7.5* 7.4*   No results for input(s): PTH in the last 72 hours. Iron Studies: No results for input(s): IRON, TIBC, TRANSFERRIN, FERRITIN in the last 72 hours.  Studies/Results: No results found.  I have reviewed the patient's current medications.  Assessment/Plan: Problem #1 fever: Etiology at this moment is not clear. Patient Remains afebrile with normal white blood cell count. Problem #3 hypokalemia: Her potassium has corrected  Problem #4 history of diabetis: His blood sugar is reasonably controlled  Problem #5 anemia: His hemoglobin is low is stable. Patient is on Epogen during dialysis. Problem #6 bone and middle disorder: His calcium and phosphorus is in range Plan: 1]We'll make arrangements for patient to get dialysis  today 2]We'll use 4 K/2.5 calcium bath 3]We'll use Epogen 10,000 units IV after each dialysis 4]We'll cc IV fluid  5] we'll follow patient as an outpatient when he is discharged.    LOS: 4 days   Azeem Poorman S 05/21/2017,9:55 AM

## 2017-05-21 NOTE — Clinical Social Work Note (Signed)
Patient from [facility] and will return. Patient does not need FL2. LCSW spoke with Marianna Fuss at [facility] and advised that patient would return after his dialysis today.   LCSW sent discharge clinicals.  LCSW signing off.       Julian Askin, Clydene Pugh, LCSW

## 2017-05-21 NOTE — Progress Notes (Signed)
Patient is to be discharged back to [facility]. Report has been called to [place] at the facility. Patient will be escorted via wheelchair by staff.  Celestia Khat, RN

## 2017-05-22 ENCOUNTER — Encounter: Payer: Self-pay | Admitting: Internal Medicine

## 2017-05-22 ENCOUNTER — Encounter (HOSPITAL_COMMUNITY)
Admission: RE | Admit: 2017-05-22 | Discharge: 2017-05-22 | Disposition: A | Discharge status: Home / Self Care | Payer: Medicare Other | Source: Skilled Nursing Facility | Attending: *Deleted | Admitting: *Deleted

## 2017-05-22 ENCOUNTER — Non-Acute Institutional Stay (SKILLED_NURSING_FACILITY): Payer: Medicare Other | Admitting: Internal Medicine

## 2017-05-22 DIAGNOSIS — R509 Fever, unspecified: Secondary | ICD-10-CM | POA: Diagnosis not present

## 2017-05-22 DIAGNOSIS — A4189 Other specified sepsis: Secondary | ICD-10-CM | POA: Insufficient documentation

## 2017-05-22 DIAGNOSIS — N186 End stage renal disease: Secondary | ICD-10-CM

## 2017-05-22 DIAGNOSIS — D62 Acute posthemorrhagic anemia: Secondary | ICD-10-CM | POA: Diagnosis not present

## 2017-05-22 DIAGNOSIS — I82401 Acute embolism and thrombosis of unspecified deep veins of right lower extremity: Secondary | ICD-10-CM

## 2017-05-22 DIAGNOSIS — N401 Enlarged prostate with lower urinary tract symptoms: Secondary | ICD-10-CM | POA: Insufficient documentation

## 2017-05-22 DIAGNOSIS — Z9049 Acquired absence of other specified parts of digestive tract: Secondary | ICD-10-CM | POA: Insufficient documentation

## 2017-05-22 DIAGNOSIS — K635 Polyp of colon: Secondary | ICD-10-CM | POA: Diagnosis not present

## 2017-05-22 DIAGNOSIS — S72001S Fracture of unspecified part of neck of right femur, sequela: Secondary | ICD-10-CM

## 2017-05-22 DIAGNOSIS — Z7901 Long term (current) use of anticoagulants: Secondary | ICD-10-CM | POA: Insufficient documentation

## 2017-05-22 LAB — CBC WITH DIFFERENTIAL/PLATELET
Basophils Absolute: 0 10*3/uL (ref 0.0–0.1)
Basophils Relative: 0 %
Eosinophils Absolute: 0.3 10*3/uL (ref 0.0–0.7)
Eosinophils Relative: 3 %
HCT: 24.6 % — ABNORMAL LOW (ref 39.0–52.0)
Hemoglobin: 7.8 g/dL — ABNORMAL LOW (ref 13.0–17.0)
Lymphocytes Relative: 19 %
Lymphs Abs: 1.7 10*3/uL (ref 0.7–4.0)
MCH: 30.2 pg (ref 26.0–34.0)
MCHC: 31.7 g/dL (ref 30.0–36.0)
MCV: 95.3 fL (ref 78.0–100.0)
Monocytes Absolute: 1 10*3/uL (ref 0.1–1.0)
Monocytes Relative: 11 %
Neutro Abs: 5.9 10*3/uL (ref 1.7–7.7)
Neutrophils Relative %: 67 %
Platelets: 245 10*3/uL (ref 150–400)
RBC: 2.58 MIL/uL — ABNORMAL LOW (ref 4.22–5.81)
RDW: 17.5 % — ABNORMAL HIGH (ref 11.5–15.5)
WBC: 8.8 10*3/uL (ref 4.0–10.5)

## 2017-05-22 LAB — BASIC METABOLIC PANEL
Anion gap: 7 (ref 5–15)
BUN: 18 mg/dL (ref 6–20)
CO2: 29 mmol/L (ref 22–32)
Calcium: 7.6 mg/dL — ABNORMAL LOW (ref 8.9–10.3)
Chloride: 104 mmol/L (ref 101–111)
Creatinine, Ser: 3.46 mg/dL — ABNORMAL HIGH (ref 0.61–1.24)
GFR calc Af Amer: 19 mL/min — ABNORMAL LOW (ref >60)
GFR calc non Af Amer: 17 mL/min — ABNORMAL LOW (ref >60)
Glucose, Bld: 65 mg/dL (ref 65–99)
Potassium: 3.7 mmol/L (ref 3.5–5.1)
Sodium: 140 mmol/L (ref 135–145)

## 2017-05-22 LAB — CULTURE, BLOOD (ROUTINE X 2)
Culture: NO GROWTH
Culture: NO GROWTH
Special Requests: ADEQUATE

## 2017-05-22 NOTE — Progress Notes (Signed)
Provider: Granville Lewis  Location:   [place] Room Number: 112/D Place of Service:  SNF (31)  PCP: [name], MD Patient Care Team: [name], MD as PCP - General (Internal Medicine) Cassandria Anger, MD as Consulting Physician (Endocrinology) Fran Lowes, MD as Consulting Physician (Nephrology) Gala Romney Cristopher Estimable, MD as Consulting Physician (Gastroenterology)  Extended Emergency Contact Information Primary Emergency Contact: Betsey Amen Address: DSS  [place] of [place] Phone: [phone] Work Phone: [phone] Relation: Legal Guardian Secondary Emergency Contact: Cavey,Richard Address: [address]          [place], [place] [address] [place] Phone: [phone] Relation: Brother  Code Status: Full Code Goals of Care: Advanced Directive information Advanced Directives 05/22/2017  Does Patient Have a Medical Advance Directive? Yes  Type of Advance Directive (No Data)  Does patient want to make changes to medical advance directive? No - Patient declined  Copy of [place] in Chart? -  Would patient like information on creating a medical advance directive? No - Patient declined  Pre-existing out of facility DNR order (yellow form or pink MOST form) -      Chief Complaint  Patient presents with  . 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 male seen today for follow-up of hospitalization most recently for fever of unknown origin.  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  He's also had a history of anemia with occult positive stool-and was evaluated by GI-he did have a colonoscopy in the form a recent hospital admission developed significant rectal bleeding his hemoglobin actually was found to be under 6 and he was sent to the ER where he did undergo a colectomy secondary to adenocarcinoma of the colon he also received a transfusion.  He came back to skilled nursing for a short period but developed a fever and low blood pressure and was sent back to the hospital.  Workup for fever was negative in regards to blood cultures and C. difficile urinalysis was negative chest x-ray was negative he did respond to IV antibiotics however and improved clinically.  He is now back in the facility.  He has no complaints she is so poor historian secondary to mental retardation he has been discharged on Augmentin.  Regards to other medical issues these appear to be relatively stable--I note his blood sugar was 67 this morning he is not on any diabetic meds at this point at bedtime snacks will have to be encouraged.  He    was followed by dialysis in the hospital.  In regards to anemia he does receive Epogen at dialysis hemoglobin today is 7.8 is relatively baseline with recent labs.  He is mildly tachycardic today but this has been somewhat chronic for him blood pressure is stable clinically he appears stable although somewhat weaker than when I seen him previously.  Regards to DVT of right lower extremity he continues on Lovenox.  Blood pressure appears stable today he is not really on any pressure medicines he is on Demadex I note and this is monitored by  dialysis.      Past Medical History:  Diagnosis Date  . Abnormal CT scan, kidney 10/06/2011  . Acute pyelonephritis 10/07/2011  . Anemia    normocytic  . Anxiety    mental retardation  . Bladder wall thickening 10/06/2011  . BPH  (benign prostatic hypertrophy)   . Diabetes mellitus   . Edema     history of lower extremity edema  . Heme positive stool   . Hydronephrosis   . Hyperkalemia   . Hyperlipidemia   . Hypernatremia   . Hypertension   . Hypothyroidism   . Impaired speech   . 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 MR (mental retardation)   . Obstructive uropathy   . 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 Poor historian poor historian  . Protein calorie malnutrition ([place])   . Pyelonephritis   . Renal failure (ARF), acute on chronic (HCC)   . Renal insufficiency    chronic history  . Smoking   . Uremia   . Urinary retention   . UTI (lower urinary tract infection) 10/06/2011  . UTI (lower urinary tract infection)    Past Surgical History:  Procedure Laterality Date  . AV FISTULA PLACEMENT Left 07/06/2015   Procedure:  INSERTION LEFT ARM ARTERIOVENOUS GORTEX GRAFT;  Surgeon: Angelia Mould, MD;  Location: [place];  Service: Vascular;  Laterality: Left;  . AV FISTULA PLACEMENT Right 02/26/2016   Procedure: ARTERIOVENOUS (AV) FISTULA CREATION ;  Surgeon: Angelia Mould, MD;  Location: [place];  Service: Vascular;  Laterality: Right;  . [place] REMOVAL Left 10/09/2015   Procedure: REMOVAL OF ARTERIOVENOUS GORETEX GRAFT ([place]) Evacuation of Lymphocele, Vein Patch angioplasty of brachial artery.;  Surgeon: Angelia Mould, MD;  Location: [place];  Service: Vascular;  Laterality: Left;  . BASCILIC VEIN TRANSPOSITION Right 02/26/2016   Procedure: Right BASCILIC VEIN TRANSPOSITION;  Surgeon: Angelia Mould, MD;  Location: [place];  Service: Vascular;  Laterality: Right;  . CIRCUMCISION N/A 01/04/2014   Procedure: CIRCUMCISION ADULT (procedure #1);  Surgeon: Marissa Nestle, MD;  Location: [facility];  Service: Urology;  Laterality: N/A;  . COLECTOMY N/A 05/04/2017   Procedure: TOTAL COLECTOMY;  Surgeon: Aviva Signs, MD;  Location: [facility];  Service: General;  Laterality: N/A;  . COLONOSCOPY N/A  04/27/2017   Procedure: COLONOSCOPY;  Surgeon: Daneil Dolin, MD;  [facility];  Service: Endoscopy;  Laterality: N/A;  245  . CYSTOSCOPY W/ RETROGRADES Bilateral 06/29/2015   Procedure: CYSTOSCOPY, DILATION OF URETHRAL STRICTURE WITH BILATERAL RETROGRADE PYELOGRAM,SUPRAPUBIC TUBE CHANGE;  Surgeon: Festus Aloe, MD;  Location: [facility];  Service: Urology;  Laterality: Bilateral;  . CYSTOSCOPY WITH URETHRAL DILATATION N/A 12/29/2013   Procedure: CYSTOSCOPY WITH URETHRAL DILATATION;  Surgeon: Marissa Nestle, MD;  [facility];  Service: Urology;  Laterality: N/A;  . ESOPHAGOGASTRODUODENOSCOPY N/A 04/27/2017   Procedure: ESOPHAGOGASTRODUODENOSCOPY (EGD);  Surgeon: Daneil Dolin, MD;  [facility];  Service: Endoscopy;  Laterality: N/A;  . ORIF FEMUR FRACTURE Right 11/22/2016   Procedure: OPEN REDUCTION INTERNAL FIXATION (ORIF) DISTAL FEMUR FRACTURE;  Surgeon: Rod Can, MD;  Location: [place];  Service: Orthopedics;  Laterality: Right;  . PERIPHERAL VASCULAR CATHETERIZATION N/A 10/08/2015   Procedure: A/V Shuntogram;  Surgeon: Angelia Mould, MD;  Location: [facility];  Service: Cardiovascular;  Laterality: N/A;  . TRANSURETHRAL RESECTION OF PROSTATE N/A 01/04/2014   Procedure: TRANSURETHRAL RESECTION OF THE PROSTATE (TURP) (procedure #2);  Surgeon: Marissa Nestle,  MD;  Location[facility];  Service: Urology;  Laterality: N/A;    reports that he has never smoked. He has never used smokeless tobacco. He reports that he does not drink alcohol or use drugs. Social History   Social History  . Marital status: Single    Spouse name: N/A  . Number of children: N/A  . Years of education: N/A   Occupational History  . Not on file.   Social History Main Topics  . Smoking status: Never Smoker  . Smokeless tobacco: Never Used  . Alcohol use No     Comment: occ. use   . Drug use: No  . Sexual activity: No   Other Topics Concern  . Not on file   Social  History Narrative   Lives at nursing home.    Functional Status Survey:    Family History  Problem Relation Age of Onset  . Cancer Mother   . Colon cancer Neg Hx     Health Maintenance  Topic Date Due  . FOOT EXAM  08/23/1958  . OPHTHALMOLOGY EXAM  08/23/1958  . URINE MICROALBUMIN  08/23/1958  . TETANUS/TDAP  08/24/1967  . INFLUENZA VACCINE  09/26/2017 (Originally 05/27/2017)  . PNA vac Low Risk Adult (2 of 2 - PCV13) 09/26/2017 (Originally 03/03/2016)  . HEMOGLOBIN A1C  08/18/2017  . COLONOSCOPY  04/28/2027  . Hepatitis C Screening  Completed    No Known Allergies  Outpatient Encounter Prescriptions as of 05/22/2017  Medication Sig  . Amino Acids-Protein Hydrolys (FEEDING SUPPLEMENT, PRO-STAT 64,) LIQD Take 30 mLs by mouth 2 (two) times daily between meals.  Marland Kitchen amoxicillin-clavulanate (AUGMENTIN) 500-125 MG tablet Take 1 tablet (500 mg total) by mouth 2 (two) times daily.  Marland Kitchen atorvastatin (LIPITOR) 40 MG tablet Take 40 mg by mouth every evening.  . enoxaparin (LOVENOX) 100 MG/ML injection Inject 85 mg into the skin every 12 (twelve) hours.  Marland Kitchen levothyroxine (SYNTHROID, LEVOTHROID) 75 MCG tablet Take 75 mcg by mouth daily before breakfast.   . Multiple Vitamin (MULTIVITAMIN WITH MINERALS) TABS tablet Take 1 tablet by mouth every evening.   . Neo-Bacit-Poly-Lidocaine (FIRST AID PLUS LIDOCAINE EX) Apply 1 application topically Every Tuesday,Thursday,and Saturday with dialysis. Prior to dialysis  . Nutritional Supplements (NEPRO/CARBSTEADY PO) Take 1 Can by mouth 2 (two) times daily.  Marland Kitchen omeprazole (PRILOSEC) 40 MG capsule Take 40 mg by mouth every evening.  . tamsulosin (FLOMAX) 0.4 MG CAPS capsule Take 0.4 mg by mouth daily at 8 pm. Give 30 minutes after a meal  . torsemide (DEMADEX) 10 MG tablet Take 5 tablets (50 mg total) by mouth daily.   No facility-administered encounter medications on file as of 05/22/2017.      Review of Systems   This is limited secondary patient being a  poor story provided by nursing as well.  General no complaints of fever or chills 99.1 is quite low-grade will have to be watched.  Skin does not complain of rashes or itching does have surgical site mid abdomen covered with Steri-Strips which appears benign at this point.  Head ears eyes nose mouth and throat does not complaining of any sore throat or visual changes.  Respiratory does not complain of shortness breath or cough.  Cardiac denies chest pain has some mild lower extremity edema.  GI does not complain of abdominal discomfort nausea vomiting diarrhea constipation.  GU is end-stage dialysis does not complain of dysuria.  Musculoskeletal is not complaining of joint pain moves his extremities at baseline  does have some weakness ambulates largely in a wheelchair does not complain of joint pain.  Neurologic does not complain of dizziness headache or syncope.   psych does have a history mental retardation appears to be at his baseline no overt anxiety depression noted appears to have a good appetite which is his baseline seems to always be hungry   Vitals:   05/22/17 1305  BP: (!) 112/50  Pulse: (!) 104  Resp: 20  Temp: 99.1 F (37.3 C)  TempSrc: Oral    Physical Exam In general this is a very pleasant middle-age male in no distress lying comfortably in bed.--  His skin is warm and dry. He is appreciated shunt upper right arm-she also has a well-healed surgical scar mid abdomen covered with Steri-Strips no sign of infection  Oropharynx clear mucous membranes moist.  Eyes visual acuity appears grossly intact.  Chest is clear to auscultation with somewhat shallow air entry there is no labored breathing.  Heart rate is slightly tachycardic heart sounds are somewhat distant  he does have a bruit appreciated right upper arm.  He has slight lower extremity edema.  Abdomen soft nontender positive bowel sounds.   Muscle skeletal does move all extremities 4 at  baseline  . Limited exam since patient is in bed  Has baseline upper extremity strength.  Neurologic is grossly intact to speech is clear no lateralizing findings.  Psych he is pleasant and appropriate with mild mental deficits does well with supportive care  Labs reviewed: Basic Metabolic Panel:  Recent Labs  05/13/17 0752  05/19/17 0653 05/20/17 0631 05/22/17 0650  NA 137  < > 138 140 140  K 3.6  < > 2.7* 3.1* 3.7  CL 95*  < > 103 103 104  CO2 25  < > 26 27 29   GLUCOSE 116*  < > 78 136* 65  BUN 60*  < > 39* 27* 18  CREATININE 5.80*  < > 5.65* 4.31* 3.46*  CALCIUM 7.7*  < > 7.5* 7.4* 7.6*  PHOS 5.8*  --  4.8* 3.6  --   < > = values in this interval not displayed. Liver Function Tests:  Recent Labs  11/28/16 1400  05/17/17 1558 05/18/17 1026 05/19/17 0653 05/20/17 0631  AST 27  --  36 29  --   --   ALT <5*  --  17 15*  --   --   ALKPHOS 68  --  102 92  --   --   BILITOT 0.9  --  1.0 0.9  --   --   PROT 7.6  --  5.9* 5.3*  --   --   ALBUMIN 2.9*  < > 2.3* 2.0* 1.8* 1.8*  < > = values in this interval not displayed.  Recent Labs  11/28/16 1400  LIPASE 65*  AMYLASE 189*   No results for input(s): AMMONIA in the last 8760 hours. CBC:  Recent Labs  05/09/17 0943  05/17/17 1558  05/19/17 0650 05/20/17 0631 05/22/17 0650  WBC 8.5  < > 10.4  < > 10.1 8.9 8.8  NEUTROABS 5.9  --  6.5  --   --   --  5.9  HGB 8.7*  < > 9.7*  < > 7.2* 7.6* 7.8*  HCT 26.8*  < > 30.6*  < > 22.5* 23.8* 24.6*  MCV 97.8  < > 95.0  < > 93.0 93.7 95.3  PLT 246  < > 360  < > 240 258 245  < > =  values in this interval not displayed. Cardiac Enzymes: No results for input(s): CKTOTAL, CKMB, CKMBINDEX, TROPONINI in the last 8760 hours. BNP: Invalid input(s): POCBNP Lab Results  Component Value Date   HGBA1C 4.8 02/16/2017   Lab Results  Component Value Date   TSH 3.004 01/16/2017   Lab Results  Component Value Date   VITAMINB12 533 05/01/2017   Lab Results  Component  Value Date   FOLATE 56.7 05/01/2017   Lab Results  Component Value Date   IRON 34 (L) 05/01/2017   TIBC 231 (L) 05/01/2017   FERRITIN 85 05/01/2017    Imaging and Procedures obtained prior to SNF admission: No results found.  Assessment/Plan  #1 fever of unknown origin again workup in the hospital was done including blood cultures C. difficile urinalysis and chest x-ray all. Be negative nonetheless he did respond to antibiotics has been discharged on Augmentin will have to clarify length of administration currently is afebrile temperature of 99.1 this will have to be watched she appears to be stable however-tachycardia is somewhat chronic and mild He was evaluated by surgery for concerns of colitis with recent surgery however it was thought canges found on imaging were largely postoperative and not acute.  #2 history of severe anemia-again he was transfused during previous hospital admission also did have a colectomy done for adenocarcinoma of the colon this has been followed by surgery.  Hemoglobin of 7.8 today show stability he does receive Epogen at dialysis-will update labs next week to ensure stability he is followed by dialysis as well.  He continues on a proton pump inhibitor as well  #3 history of end-stage renal disease on dialysis again he does receive this 3 days a week he is scheduled again for tomorrow he was followed by nephrology in the hospital as well apparently has some electrolyte abnormalities which were addressed by nephrology.  #4 history diabetes type 2 this appears diet-controlled CBGs this morning was in the 60s we will have to encourage an at bedtime snack and monitor this.--Hemoglobin A1c in April was 4.8  #5 history of right leg DVT-had been on Coumadin and he is now on Lovenox at this point will monitor and continue Lovenox.  #6 history of hypothyroidism--continues on Synthroid TSH was 3.004 on lab done in March we will update this next week.  #7 history  of hyperlipidemia lipid panel in March  showed an LDL of 50.  #8 history of hypertension actually hypotension is been more of an issue at times he is no longer on any blood pressure medication at this point will monitor blood pressure appears stable today at 112/50 which actually is fairly good for him.  #9-history of fracture of the neck of the right femur status post ORIF this has been followed by orthopedics-as well as by therapy.  [id number] note greater than 40 minutes spent assessing patient-reviewing his chart reviewing his labs and coordinating formulating a plan of care for numerous diagnoses-of note greater than 50% of time spent coordinating plan of care

## 2017-05-23 DIAGNOSIS — D631 Anemia in chronic kidney disease: Secondary | ICD-10-CM | POA: Diagnosis not present

## 2017-05-23 DIAGNOSIS — R110 Nausea: Secondary | ICD-10-CM | POA: Diagnosis not present

## 2017-05-23 DIAGNOSIS — N2581 Secondary hyperparathyroidism of renal origin: Secondary | ICD-10-CM | POA: Diagnosis not present

## 2017-05-23 DIAGNOSIS — Z992 Dependence on renal dialysis: Secondary | ICD-10-CM | POA: Diagnosis not present

## 2017-05-23 DIAGNOSIS — N186 End stage renal disease: Secondary | ICD-10-CM | POA: Diagnosis not present

## 2017-05-23 DIAGNOSIS — D509 Iron deficiency anemia, unspecified: Secondary | ICD-10-CM | POA: Diagnosis not present

## 2017-05-25 ENCOUNTER — Encounter (HOSPITAL_COMMUNITY)
Admission: RE | Admit: 2017-05-25 | Discharge: 2017-05-25 | Disposition: A | Discharge status: Home / Self Care | Payer: Medicare Other | Source: Skilled Nursing Facility | Attending: *Deleted | Admitting: *Deleted

## 2017-05-25 LAB — BASIC METABOLIC PANEL
Anion gap: 11 (ref 5–15)
BUN: 17 mg/dL (ref 6–20)
CO2: 24 mmol/L (ref 22–32)
Calcium: 7.6 mg/dL — ABNORMAL LOW (ref 8.9–10.3)
Chloride: 107 mmol/L (ref 101–111)
Creatinine, Ser: 4.67 mg/dL — ABNORMAL HIGH (ref 0.61–1.24)
GFR calc Af Amer: 14 mL/min — ABNORMAL LOW (ref >60)
GFR calc non Af Amer: 12 mL/min — ABNORMAL LOW (ref >60)
Glucose, Bld: 62 mg/dL — ABNORMAL LOW (ref 65–99)
Potassium: 3.6 mmol/L (ref 3.5–5.1)
Sodium: 142 mmol/L (ref 135–145)

## 2017-05-26 ENCOUNTER — Encounter (HOSPITAL_COMMUNITY)
Admission: RE | Admit: 2017-05-26 | Discharge: 2017-05-26 | Disposition: A | Discharge status: Home / Self Care | Payer: Medicare Other | Source: Skilled Nursing Facility | Attending: *Deleted | Admitting: *Deleted

## 2017-05-26 DIAGNOSIS — A4189 Other specified sepsis: Secondary | ICD-10-CM | POA: Insufficient documentation

## 2017-05-26 DIAGNOSIS — D631 Anemia in chronic kidney disease: Secondary | ICD-10-CM | POA: Diagnosis not present

## 2017-05-26 DIAGNOSIS — N401 Enlarged prostate with lower urinary tract symptoms: Secondary | ICD-10-CM | POA: Insufficient documentation

## 2017-05-26 DIAGNOSIS — N2581 Secondary hyperparathyroidism of renal origin: Secondary | ICD-10-CM | POA: Diagnosis not present

## 2017-05-26 DIAGNOSIS — N186 End stage renal disease: Secondary | ICD-10-CM | POA: Diagnosis not present

## 2017-05-26 DIAGNOSIS — Z7901 Long term (current) use of anticoagulants: Secondary | ICD-10-CM | POA: Insufficient documentation

## 2017-05-26 DIAGNOSIS — Z9049 Acquired absence of other specified parts of digestive tract: Secondary | ICD-10-CM | POA: Insufficient documentation

## 2017-05-26 DIAGNOSIS — Z992 Dependence on renal dialysis: Secondary | ICD-10-CM | POA: Diagnosis not present

## 2017-05-26 DIAGNOSIS — E039 Hypothyroidism, unspecified: Secondary | ICD-10-CM | POA: Insufficient documentation

## 2017-05-26 DIAGNOSIS — D509 Iron deficiency anemia, unspecified: Secondary | ICD-10-CM | POA: Diagnosis not present

## 2017-05-26 DIAGNOSIS — R110 Nausea: Secondary | ICD-10-CM | POA: Diagnosis not present

## 2017-05-26 LAB — CBC WITH DIFFERENTIAL/PLATELET
Basophils Absolute: 0 10*3/uL (ref 0.0–0.1)
Basophils Relative: 0 %
Eosinophils Absolute: 0.2 10*3/uL (ref 0.0–0.7)
Eosinophils Relative: 3 %
HCT: 24.3 % — ABNORMAL LOW (ref 39.0–52.0)
Hemoglobin: 7.6 g/dL — ABNORMAL LOW (ref 13.0–17.0)
Lymphocytes Relative: 25 %
Lymphs Abs: 1.7 10*3/uL (ref 0.7–4.0)
MCH: 30.3 pg (ref 26.0–34.0)
MCHC: 31.3 g/dL (ref 30.0–36.0)
MCV: 96.8 fL (ref 78.0–100.0)
Monocytes Absolute: 0.8 10*3/uL (ref 0.1–1.0)
Monocytes Relative: 11 %
Neutro Abs: 4.2 10*3/uL (ref 1.7–7.7)
Neutrophils Relative %: 61 %
Platelets: 351 10*3/uL (ref 150–400)
RBC: 2.51 MIL/uL — ABNORMAL LOW (ref 4.22–5.81)
RDW: 18.2 % — ABNORMAL HIGH (ref 11.5–15.5)
WBC: 6.9 10*3/uL (ref 4.0–10.5)

## 2017-05-26 LAB — BASIC METABOLIC PANEL
Anion gap: 10 (ref 5–15)
BUN: 25 mg/dL — ABNORMAL HIGH (ref 6–20)
CO2: 25 mmol/L (ref 22–32)
Calcium: 7.8 mg/dL — ABNORMAL LOW (ref 8.9–10.3)
Chloride: 106 mmol/L (ref 101–111)
Creatinine, Ser: 4.99 mg/dL — ABNORMAL HIGH (ref 0.61–1.24)
GFR calc Af Amer: 12 mL/min — ABNORMAL LOW (ref >60)
GFR calc non Af Amer: 11 mL/min — ABNORMAL LOW (ref >60)
Glucose, Bld: 83 mg/dL (ref 65–99)
Potassium: 3.1 mmol/L — ABNORMAL LOW (ref 3.5–5.1)
Sodium: 141 mmol/L (ref 135–145)

## 2017-05-26 LAB — TSH: TSH: 3.527 u[IU]/mL (ref 0.350–4.500)

## 2017-05-27 ENCOUNTER — Encounter: Payer: Self-pay | Admitting: Internal Medicine

## 2017-05-27 ENCOUNTER — Non-Acute Institutional Stay (SKILLED_NURSING_FACILITY): Payer: Medicare Other | Admitting: Internal Medicine

## 2017-05-27 DIAGNOSIS — A419 Sepsis, unspecified organism: Secondary | ICD-10-CM

## 2017-05-27 DIAGNOSIS — D62 Acute posthemorrhagic anemia: Secondary | ICD-10-CM | POA: Diagnosis not present

## 2017-05-27 DIAGNOSIS — I10 Essential (primary) hypertension: Secondary | ICD-10-CM

## 2017-05-27 DIAGNOSIS — E1121 Type 2 diabetes mellitus with diabetic nephropathy: Secondary | ICD-10-CM

## 2017-05-27 NOTE — Patient Instructions (Signed)
See assessment and plan under each diagnosis in the problem list and acutely for this visit 

## 2017-05-27 NOTE — Assessment & Plan Note (Addendum)
05/27/17 relative hypotension; patient is not on antihypertensive medications Monitor for recurrent blood loss

## 2017-05-27 NOTE — Assessment & Plan Note (Signed)
Last A1c on record was 4.8% on 02/16/17, highest A1c was 6.5% on 07/20/15 recheck A1c

## 2017-05-27 NOTE — Progress Notes (Signed)
Provider:  Unice Cobble MD Location:   [facility] Room Number: 112/D Place of Service:  SNF (31)  PCP: [name], MD Patient Care Team: [name], MD as PCP - General (Internal Medicine) Cassandria Anger, MD as Consulting Physician (Endocrinology) Fran Lowes, MD as Consulting Physician (Nephrology) Gala Romney Cristopher Estimable, MD as Consulting Physician (Gastroenterology)  Extended Emergency Contact Information Primary Emergency Contact: Betsey Amen Address: DSS  [place] Phone: [phone] Work Phone: [phone] Relation: Legal Guardian Secondary Emergency Contact: Crocket,Richard Address: [address]          [place], [place] [address] [place] Phone: [phone] Relation: Brother  Code Status: Full Code Goals of Care: Advanced Directive information Advanced Directives 05/27/2017  Does Patient Have a Medical Advance Directive? Yes  Type of Advance Directive (No Data)  Does patient want to make changes to medical advance directive? No - Patient declined  Copy of [place] in Chart? -  Would patient like information on creating a medical advance directive? No - Patient declined  Pre-existing out of facility DNR order (yellow form or pink MOST form) -      Chief Complaint  Patient presents with  . Readmit To SNF   HPI: Patient is a 71 y.o. male seen today [facility]22-7/26/18 for presumed sepsis manifested as fever and elevated lactic acid. This is in the context of chronic diarrhea which was not a new process. He did not exhibit leukocytosis but had an elevated lactic acid level of 3.15. CT of the abdomen revealed inflammation of the anastomosis of the colectomy performed  05/04/17. Empirically patient was started on IV antibiotics pending cultures. MRSA by PCR had been +7/6. The blood cultures and C. difficile toxins were all  subsequently negative. He has significant normochromic, normocytic anemia with hemoglobin 7.2 and hematocrit 22.5.  At discharge his creatinine was 4.99 and GFR 12. With clinical improvement dialysis was reinitiated. . Significantly the patient had been hospitalized 7/6-7/20/18 for severe anemia related to rectal bleeding. Hemoglobin had dropped from a value of 11.2 on 5/8 to a value of 5.8 on 7/6;he was transfused. Colonoscopy revealed multiple dysplastic polyps. Colectomy was performed. Postop he had abdominal distention and ileus which gradually resolved. He was discharged to the SNF until he developed the fever 7/22 and was readmitted. Significant history includes mental retardation, diabetes, end-stage renal disease, dyslipidemia, and essential hypertension  Past Medical History:  Diagnosis Date  . Abnormal CT scan, kidney 10/06/2011  . Acute pyelonephritis 10/07/2011  . Anemia    normocytic  . Anxiety    mental retardation  . Bladder wall thickening 10/06/2011  . BPH (benign prostatic hypertrophy)   . Diabetes mellitus   . Edema     history of lower extremity edema  . Heme positive stool   . Hydronephrosis   . Hyperkalemia   . Hyperlipidemia   . Hypernatremia   . Hypertension   . Hypothyroidism   . Impaired speech   . 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 MR (mental retardation)   . Obstructive uropathy   . 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 Poor historian poor historian  . Protein calorie malnutrition ([place])   . Pyelonephritis   . Renal failure (ARF), acute on chronic (HCC)   . Renal insufficiency    chronic history  . Smoking   . Uremia   . Urinary retention   . UTI (lower urinary tract infection)  10/06/2011  . UTI (lower urinary tract infection)    Past Surgical History:  Procedure Laterality Date  . AV FISTULA PLACEMENT Left 07/06/2015   Procedure:  INSERTION LEFT ARM ARTERIOVENOUS GORTEX GRAFT;  Surgeon: Angelia Mould, MD;  Location: [place];   Service: Vascular;  Laterality: Left;  . AV FISTULA PLACEMENT Right 02/26/2016   Procedure: ARTERIOVENOUS (AV) FISTULA CREATION ;  Surgeon: Angelia Mould, MD;  Location: [place];  Service: Vascular;  Laterality: Right;  . [place] REMOVAL Left 10/09/2015   Procedure: REMOVAL OF ARTERIOVENOUS GORETEX GRAFT ([place]) Evacuation of Lymphocele, Vein Patch angioplasty of brachial artery.;  Surgeon: Angelia Mould, MD;  Location: [place];  Service: Vascular;  Laterality: Left;  . BASCILIC VEIN TRANSPOSITION Right 02/26/2016   Procedure: Right BASCILIC VEIN TRANSPOSITION;  Surgeon: Angelia Mould, MD;  Location: [facility] [place];  Service: Vascular;  Laterality: Right;  . CIRCUMCISION N/A 01/04/2014   Procedure: CIRCUMCISION ADULT (procedure #1);  Surgeon: Marissa Nestle, MD;  Location: [facility];  Service: Urology;  Laterality: N/A;  . COLECTOMY N/A 05/04/2017   Procedure: TOTAL COLECTOMY;  Surgeon: Aviva Signs, MD;  Location: [facility];  Service: General;  Laterality: N/A;  . COLONOSCOPY N/A 04/27/2017   Procedure: COLONOSCOPY;  Surgeon: Daneil Dolin, MD;  [facility];  Service: Endoscopy;  Laterality: N/A;  245  . CYSTOSCOPY W/ RETROGRADES Bilateral 06/29/2015   Procedure: CYSTOSCOPY, DILATION OF URETHRAL STRICTURE WITH BILATERAL RETROGRADE PYELOGRAM,SUPRAPUBIC TUBE CHANGE;  Surgeon: Festus Aloe, MD;  Location: [facility];  Service: Urology;  Laterality: Bilateral;  . CYSTOSCOPY WITH URETHRAL DILATATION N/A 12/29/2013   Procedure: CYSTOSCOPY WITH URETHRAL DILATATION;  Surgeon: Marissa Nestle, MD;  [facility];  Service: Urology;  Laterality: N/A;  . ESOPHAGOGASTRODUODENOSCOPY N/A 04/27/2017   Procedure: ESOPHAGOGASTRODUODENOSCOPY (EGD);  Surgeon: Daneil Dolin, MD;  [facility];  Service: Endoscopy;  Laterality: N/A;  . ORIF FEMUR FRACTURE Right 11/22/2016   Procedure: OPEN REDUCTION INTERNAL FIXATION (ORIF) DISTAL FEMUR FRACTURE;  Surgeon: Rod Can, MD;  Location: [place];   Service: Orthopedics;  Laterality: Right;  . PERIPHERAL VASCULAR CATHETERIZATION N/A 10/08/2015   Procedure: A/V Shuntogram;  Surgeon: Angelia Mould, MD;  Location: [place];  Service: Cardiovascular;  Laterality: N/A;  . TRANSURETHRAL RESECTION OF PROSTATE N/A 01/04/2014   Procedure: TRANSURETHRAL RESECTION OF THE PROSTATE (TURP) (procedure #2);  Surgeon: Marissa Nestle, MD;  [facility];  Service: Urology;  Laterality: N/A;    reports that he has never smoked. He has never used smokeless tobacco. He reports that he does not drink alcohol or use drugs. Social History   Social History  . Marital status: Single    Spouse name: N/A  . Number of children: N/A  . Years of education: N/A   Occupational History  . Not on file.   Social History Main Topics  . Smoking status: Never Smoker  . Smokeless tobacco: Never Used  . Alcohol use No     Comment: occ. use   . Drug use: No  . Sexual activity: No   Other Topics Concern  . Not on file   Social History Narrative   Lives at nursing home.    Functional Status Survey: in Wheelchair, weakness right lower extremity    Family History  Problem Relation Age of Onset  . Cancer Mother   . Colon cancer Neg Hx     Health Maintenance  Topic Date Due  . FOOT  EXAM  06/10/2017 (Originally 08/23/1956)  . OPHTHALMOLOGY EXAM  06/10/2017 (Originally 08/23/1956)  . URINE MICROALBUMIN  06/10/2017 (Originally 08/23/1956)  . INFLUENZA VACCINE  09/26/2017 (Originally 05/27/2017)  . PNA vac Low Risk Adult (2 of 2 - PCV13) 09/26/2017 (Originally 03/03/2016)  . TETANUS/TDAP  10/27/2017 (Originally 08/24/1967)  . HEMOGLOBIN A1C  08/18/2017  . COLONOSCOPY  04/28/2027  . Hepatitis C Screening  Completed    No Known Allergies  Outpatient Encounter Prescriptions as of 05/27/2017  Medication Sig  . Amino Acids-Protein Hydrolys (FEEDING SUPPLEMENT, PRO-STAT 64,) LIQD Take 30 mLs by mouth 2 (two) times daily between meals.  Marland Kitchen  atorvastatin (LIPITOR) 40 MG tablet Take 40 mg by mouth every evening.  . enoxaparin (LOVENOX) 100 MG/ML injection Inject 85 mg into the skin every 12 (twelve) hours.  Marland Kitchen levothyroxine (SYNTHROID, LEVOTHROID) 75 MCG tablet Take 75 mcg by mouth daily before breakfast.   . Multiple Vitamin (MULTIVITAMIN WITH MINERALS) TABS tablet Take 1 tablet by mouth every evening.   . Neo-Bacit-Poly-Lidocaine (FIRST AID PLUS LIDOCAINE EX) Apply 1 application topically Every Tuesday,Thursday,and Saturday with dialysis. Prior to dialysis  . Nutritional Supplements (NEPRO/CARBSTEADY PO) Take 1 Can by mouth 2 (two) times daily.  Marland Kitchen omeprazole (PRILOSEC) 40 MG capsule Take 40 mg by mouth every evening.  . tamsulosin (FLOMAX) 0.4 MG CAPS capsule Take 0.4 mg by mouth daily at 8 pm. Give 30 minutes after a meal  . torsemide (DEMADEX) 10 MG tablet Take 5 tablets (50 mg total) by mouth daily.  . [DISCONTINUED] amoxicillin-clavulanate (AUGMENTIN) 500-125 MG tablet Take 1 tablet (500 mg total) by mouth 2 (two) times daily.   No facility-administered encounter medications on file as of 05/27/2017.      Review of Systems Mental retardation invalidated responses. President named as Freight forwarder then corrected to Trump. Patient unable to give date. His responses to all questions is no. When I asked him why he been in the hospital he said "open stomach". When asked why he went back to the hospital he said "got sick"  Constitutional: No fever,significant weight change, fatigue  Eyes: No redness, discharge, pain, vision change ENT/mouth: No nasal congestion,  purulent discharge, earache,change in hearing ,sore throat  Cardiovascular: No chest pain, palpitations,paroxysmal nocturnal dyspnea, claudication, edema  Respiratory: No cough, sputum production,hemoptysis, DOE , significant snoring,apnea  Gastrointestinal: No heartburn,dysphagia,abdominal pain, nausea / vomiting,rectal bleeding, melena,change in bowels Genitourinary: No  dysuria,hematuria, pyuria,  incontinence, nocturia Musculoskeletal: No joint stiffness, joint swelling, weakness,pain Dermatologic: No rash, pruritus, change in appearance of skin Neurologic: No dizziness,headache,syncope, seizures, numbness , tingling Psychiatric: No significant anxiety , depression, insomnia, anorexia Endocrine: No change in hair/skin/ nails, excessive thirst, excessive hunger, excessive urination  Hematologic/lymphatic: No significant bruising, lymphadenopathy,abnormal bleeding Allergy/immunology: No itchy/ watery eyes, significant sneezing, urticaria, angioedema  Vitals:   05/27/17 1210  BP: (!) 95/58  Pulse: 100  Resp: 18  Temp: 98.5 F (36.9 C)  TempSrc: Oral  SpO2: 100%   There is no height or weight on file to calculate BMI. Physical Exam  Pertinent or positive findings: Exhibits a stammering, almost unintelligible speech pattern. Dentation is very poor with incredibly severe plaque formation over the right maxillary teeth. S4 gallop is suggested. The operative wound of the abdomen is well-healed. There is weakness in the right lower extremity with decreased range of motion. He is in a wheelchair.  General appearance:Adequately nourished; no acute distress , increased work of breathing is present.   Lymphatic: No lymphadenopathy about the head, neck, axilla .  Eyes: No conjunctival inflammation or lid edema is present. There is no scleral icterus. Ears:  External ear exam shows no significant lesions or deformities.   Nose:  External nasal examination shows no deformity or inflammation. Nasal mucosa are pink and moist without lesions ,exudates Oral exam: lips and gums are healthy appearing.There is no oropharyngeal erythema or exudate . Neck:  No thyromegaly, masses, tenderness noted.    Heart:  No murmur, click, rub .  Lungs:Chest clear to auscultation without wheezes, rhonchi,rales , rubs. Abdomen:Bowel sounds are normal. Abdomen is soft and nontender with  no organomegaly, hernias,masses. GU: deferred  Extremities:  No cyanosis, clubbing,edema  Skin: Warm & dry w/o tenting. No significant lesions or rash.   Labs reviewed: Basic Metabolic Panel:  Recent Labs  05/13/17 0752  05/19/17 0653 05/20/17 0631 05/22/17 0650 05/25/17 0700 05/26/17 0701  NA 137  < > 138 140 140 142 141  K 3.6  < > 2.7* 3.1* 3.7 3.6 3.1*  CL 95*  < > 103 103 104 107 106  CO2 25  < > 26 27 29 24 25   GLUCOSE 116*  < > 78 136* 65 62* 83  BUN 60*  < > 39* 27* 18 17 25*  CREATININE 5.80*  < > 5.65* 4.31* 3.46* 4.67* 4.99*  CALCIUM 7.7*  < > 7.5* 7.4* 7.6* 7.6* 7.8*  PHOS 5.8*  --  4.8* 3.6  --   --   --   < > = values in this interval not displayed. Liver Function Tests:  Recent Labs  11/28/16 1400  05/17/17 1558 05/18/17 1026 05/19/17 0653 05/20/17 0631  AST 27  --  36 29  --   --   ALT <5*  --  17 15*  --   --   ALKPHOS 68  --  102 92  --   --   BILITOT 0.9  --  1.0 0.9  --   --   PROT 7.6  --  5.9* 5.3*  --   --   ALBUMIN 2.9*  < > 2.3* 2.0* 1.8* 1.8*  < > = values in this interval not displayed.  Recent Labs  11/28/16 1400  LIPASE 65*  AMYLASE 189*   No results for input(s): AMMONIA in the last 8760 hours. CBC:  Recent Labs  05/17/17 1558  05/20/17 0631 05/22/17 0650 05/26/17 0701  WBC 10.4  < > 8.9 8.8 6.9  NEUTROABS 6.5  --   --  5.9 4.2  HGB 9.7*  < > 7.6* 7.8* 7.6*  HCT 30.6*  < > 23.8* 24.6* 24.3*  MCV 95.0  < > 93.7 95.3 96.8  PLT 360  < > 258 245 351  < > = values in this interval not displayed. Cardiac Enzymes: No results for input(s): CKTOTAL, CKMB, CKMBINDEX, TROPONINI in the last 8760 hours. BNP: Invalid input(s): POCBNP Lab Results  Component Value Date   HGBA1C 4.8 02/16/2017   Lab Results  Component Value Date   TSH 3.527 05/26/2017   Lab Results  Component Value Date   VITAMINB12 533 05/01/2017   Lab Results  Component Value Date   FOLATE 56.7 05/01/2017   Lab Results  Component Value Date   IRON  34 (L) 05/01/2017   TIBC 231 (L) 05/01/2017   FERRITIN 85 05/01/2017    Imaging and Procedures obtained prior to SNF admission: No results found.  Assessment/Plan See summary under each active problem in the Problem List with associated updated therapeutic plan  Family/ staff Communication:   Labs/tests ordered:A1c, ferrous sulfate

## 2017-05-27 NOTE — Assessment & Plan Note (Addendum)
Anemia is actually multifactorial related to GI blood loss, surgery as well as renal insufficiency B12 and folate normal, iron was low as expected Ferrous sulfate X 4 weeks

## 2017-05-27 NOTE — Assessment & Plan Note (Signed)
05/27/17 blood pressure low on no antihypertensive medications; significant anemia present

## 2017-05-28 ENCOUNTER — Encounter (HOSPITAL_COMMUNITY)
Admission: RE | Admit: 2017-05-28 | Discharge: 2017-05-28 | Disposition: A | Discharge status: Home / Self Care | Payer: Medicare Other | Source: Skilled Nursing Facility | Attending: Internal Medicine | Admitting: Internal Medicine

## 2017-05-28 DIAGNOSIS — R279 Unspecified lack of coordination: Secondary | ICD-10-CM | POA: Insufficient documentation

## 2017-05-28 DIAGNOSIS — A4189 Other specified sepsis: Secondary | ICD-10-CM | POA: Insufficient documentation

## 2017-05-28 DIAGNOSIS — D509 Iron deficiency anemia, unspecified: Secondary | ICD-10-CM | POA: Diagnosis not present

## 2017-05-28 DIAGNOSIS — D631 Anemia in chronic kidney disease: Secondary | ICD-10-CM | POA: Diagnosis not present

## 2017-05-28 DIAGNOSIS — N401 Enlarged prostate with lower urinary tract symptoms: Secondary | ICD-10-CM | POA: Insufficient documentation

## 2017-05-28 DIAGNOSIS — Z9049 Acquired absence of other specified parts of digestive tract: Secondary | ICD-10-CM | POA: Insufficient documentation

## 2017-05-28 DIAGNOSIS — N186 End stage renal disease: Secondary | ICD-10-CM | POA: Diagnosis not present

## 2017-05-28 DIAGNOSIS — Z7901 Long term (current) use of anticoagulants: Secondary | ICD-10-CM | POA: Insufficient documentation

## 2017-05-28 DIAGNOSIS — Z992 Dependence on renal dialysis: Secondary | ICD-10-CM | POA: Diagnosis not present

## 2017-05-29 LAB — HEMOGLOBIN A1C
Hgb A1c MFr Bld: 5.4 % (ref 4.8–5.6)
Mean Plasma Glucose: 108 mg/dL

## 2017-05-30 ENCOUNTER — Encounter (HOSPITAL_COMMUNITY): Payer: Self-pay | Admitting: *Deleted

## 2017-05-30 ENCOUNTER — Emergency Department (HOSPITAL_COMMUNITY)
Admission: EM | Admit: 2017-05-30 | Discharge: 2017-05-31 | Disposition: A | Discharge status: Home / Self Care | Payer: Medicare Other | Source: Home / Self Care | Attending: Emergency Medicine | Admitting: Emergency Medicine

## 2017-05-30 DIAGNOSIS — Y742 Prosthetic and other implants, materials and accessory general hospital and personal-use devices associated with adverse incidents: Secondary | ICD-10-CM | POA: Insufficient documentation

## 2017-05-30 DIAGNOSIS — E039 Hypothyroidism, unspecified: Secondary | ICD-10-CM

## 2017-05-30 DIAGNOSIS — I959 Hypotension, unspecified: Secondary | ICD-10-CM

## 2017-05-30 DIAGNOSIS — E1122 Type 2 diabetes mellitus with diabetic chronic kidney disease: Secondary | ICD-10-CM | POA: Insufficient documentation

## 2017-05-30 DIAGNOSIS — R509 Fever, unspecified: Secondary | ICD-10-CM | POA: Diagnosis not present

## 2017-05-30 DIAGNOSIS — F79 Unspecified intellectual disabilities: Secondary | ICD-10-CM | POA: Insufficient documentation

## 2017-05-30 DIAGNOSIS — E871 Hypo-osmolality and hyponatremia: Secondary | ICD-10-CM | POA: Diagnosis not present

## 2017-05-30 DIAGNOSIS — I120 Hypertensive chronic kidney disease with stage 5 chronic kidney disease or end stage renal disease: Secondary | ICD-10-CM

## 2017-05-30 DIAGNOSIS — R7989 Other specified abnormal findings of blood chemistry: Secondary | ICD-10-CM

## 2017-05-30 DIAGNOSIS — D649 Anemia, unspecified: Secondary | ICD-10-CM | POA: Diagnosis not present

## 2017-05-30 DIAGNOSIS — D62 Acute posthemorrhagic anemia: Secondary | ICD-10-CM | POA: Diagnosis not present

## 2017-05-30 DIAGNOSIS — R740 Nonspecific elevation of levels of transaminase and lactic acid dehydrogenase [LDH]: Secondary | ICD-10-CM | POA: Diagnosis not present

## 2017-05-30 DIAGNOSIS — D631 Anemia in chronic kidney disease: Secondary | ICD-10-CM

## 2017-05-30 DIAGNOSIS — D509 Iron deficiency anemia, unspecified: Secondary | ICD-10-CM | POA: Diagnosis not present

## 2017-05-30 DIAGNOSIS — T82838A Hemorrhage of vascular prosthetic devices, implants and grafts, initial encounter: Secondary | ICD-10-CM | POA: Insufficient documentation

## 2017-05-30 DIAGNOSIS — E43 Unspecified severe protein-calorie malnutrition: Secondary | ICD-10-CM | POA: Diagnosis not present

## 2017-05-30 DIAGNOSIS — R000 Tachycardia, unspecified: Secondary | ICD-10-CM | POA: Diagnosis not present

## 2017-05-30 DIAGNOSIS — J9811 Atelectasis: Secondary | ICD-10-CM | POA: Diagnosis not present

## 2017-05-30 DIAGNOSIS — N186 End stage renal disease: Secondary | ICD-10-CM

## 2017-05-30 DIAGNOSIS — Z992 Dependence on renal dialysis: Secondary | ICD-10-CM

## 2017-05-30 DIAGNOSIS — Z79899 Other long term (current) drug therapy: Secondary | ICD-10-CM | POA: Insufficient documentation

## 2017-05-30 DIAGNOSIS — R0989 Other specified symptoms and signs involving the circulatory and respiratory systems: Secondary | ICD-10-CM | POA: Diagnosis not present

## 2017-05-30 DIAGNOSIS — R197 Diarrhea, unspecified: Secondary | ICD-10-CM | POA: Diagnosis not present

## 2017-05-30 HISTORY — DX: Gastro-esophageal reflux disease without esophagitis: K21.9

## 2017-05-30 HISTORY — DX: Sepsis, unspecified organism: A41.9

## 2017-05-30 HISTORY — DX: Muscle weakness (generalized): M62.81

## 2017-05-30 HISTORY — DX: Dependence on renal dialysis: Z99.2

## 2017-05-30 LAB — I-STAT CG4 LACTIC ACID, ED: Lactic Acid, Venous: 2.59 mmol/L (ref 0.5–1.9)

## 2017-05-30 LAB — BASIC METABOLIC PANEL
Anion gap: 11 (ref 5–15)
BUN: 20 mg/dL (ref 6–20)
CO2: 26 mmol/L (ref 22–32)
Calcium: 8.6 mg/dL — ABNORMAL LOW (ref 8.9–10.3)
Chloride: 102 mmol/L (ref 101–111)
Creatinine, Ser: 4.86 mg/dL — ABNORMAL HIGH (ref 0.61–1.24)
GFR calc Af Amer: 13 mL/min — ABNORMAL LOW (ref >60)
GFR calc non Af Amer: 11 mL/min — ABNORMAL LOW (ref >60)
Glucose, Bld: 145 mg/dL — ABNORMAL HIGH (ref 65–99)
Potassium: 3.3 mmol/L — ABNORMAL LOW (ref 3.5–5.1)
Sodium: 139 mmol/L (ref 135–145)

## 2017-05-30 LAB — CBC WITH DIFFERENTIAL/PLATELET
Basophils Absolute: 0 10*3/uL (ref 0.0–0.1)
Basophils Relative: 0 %
Eosinophils Absolute: 0.1 10*3/uL (ref 0.0–0.7)
Eosinophils Relative: 1 %
HCT: 28.6 % — ABNORMAL LOW (ref 39.0–52.0)
Hemoglobin: 8.9 g/dL — ABNORMAL LOW (ref 13.0–17.0)
Lymphocytes Relative: 24 %
Lymphs Abs: 2 10*3/uL (ref 0.7–4.0)
MCH: 30.8 pg (ref 26.0–34.0)
MCHC: 31.1 g/dL (ref 30.0–36.0)
MCV: 99 fL (ref 78.0–100.0)
Monocytes Absolute: 1 10*3/uL (ref 0.1–1.0)
Monocytes Relative: 12 %
Neutro Abs: 5.3 10*3/uL (ref 1.7–7.7)
Neutrophils Relative %: 63 %
Platelets: 376 10*3/uL (ref 150–400)
RBC: 2.89 MIL/uL — ABNORMAL LOW (ref 4.22–5.81)
RDW: 19.8 % — ABNORMAL HIGH (ref 11.5–15.5)
WBC: 8.4 10*3/uL (ref 4.0–10.5)

## 2017-05-30 NOTE — ED Notes (Signed)
Dr Roxanne Mins aware of bp 07/68

## 2017-05-30 NOTE — ED Provider Notes (Signed)
[facility] DEPT Provider Note   CSN: [id number] Arrival date & time: 05/30/17  2250     History   Chief Complaint Chief Complaint  Patient presents with  . Coagulation Disorder    HPI Zachary [name] is a 71 y.o. male.  The history is provided by the nursing home. The history is limited by the condition of the patient (Mental retardation).  He was transferred here from [facility] home where he was found to be bleeding from his AV fistula site. He has a history of end-stage renal disease, and his fistula is in the right upper arm. It is not known how long it had been bleeding.  Past Medical History:  Diagnosis Date  . Abnormal CT scan, kidney 10/06/2011  . Acute pyelonephritis 10/07/2011  . Anemia    normocytic  . Anxiety    mental retardation  . Bladder wall thickening 10/06/2011  . BPH (benign prostatic hypertrophy)   . Diabetes mellitus   . Edema     history of lower extremity edema  . Heme positive stool   . Hydronephrosis   . Hyperkalemia   . Hyperlipidemia   . Hypernatremia   . Hypertension   . Hypothyroidism   . Impaired speech   . 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 MR (mental retardation)   . Obstructive uropathy   . 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 Poor historian poor historian  . Protein calorie malnutrition ([place])   . Pyelonephritis   . Renal failure (ARF), acute on chronic (HCC)   . Renal insufficiency    chronic history  . Smoking   . Uremia   . Urinary retention   . UTI (lower urinary tract infection) 10/06/2011    Patient Active Problem List   Diagnosis Date Noted  . Sepsis ([place]) 05/17/2017  . Colitis 05/17/2017  . Dysplastic polyp of colon   . Acute blood loss anemia 05/01/2017  . ESRD (end stage renal disease) ([place]) 05/01/2017  . Anemia in CKD (chronic kidney disease) 05/01/2017  . Colonoscopy causing post-procedural bleeding   . Colonic mass   . Lower GI bleed   . Chronic anticoagulation 02/18/2017  .  Anticoagulation management encounter 12/29/2016  . DVT of leg (deep venous thrombosis) ([place]) 12/25/2016  . Closed comminuted intra-articular fracture of distal femur, right, initial encounter ([place]) 11/22/2016  . Right femoral fracture ([place]) 11/20/2016  . Hydronephrosis, bilateral 03/10/2016  . 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 Heme positive stool   . Abnormal CT scan, esophagus   . Protein-calorie malnutrition, severe ([place]) 07/10/2015  . Essential hypertension 07/08/2015  . Hyperlipidemia 07/08/2015  . Obstructive uropathy 04/17/2015  . Bladder neck contracture   . Anemia, chronic disease 04/16/2015  . Benign prostatic hyperplasia with urinary retention 03/03/2015  . Mental retardation 10/10/2011  . Acute pyelonephritis 10/07/2011  . 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 Bladder wall thickening 10/06/2011  . Iron deficiency anemia 10/02/2011  . Diabetes mellitus ([place]) 10/01/2011  . FX CLOSED FIBULA NOS 02/10/2008    Past Surgical History:  Procedure Laterality Date  . AV FISTULA PLACEMENT Left 07/06/2015   Procedure:  INSERTION LEFT ARM ARTERIOVENOUS GORTEX GRAFT;  Surgeon: Angelia Mould, MD;  Location: [facility];  Service: Vascular;  Laterality: Left;  . AV FISTULA PLACEMENT Right 02/26/2016   Procedure: ARTERIOVENOUS (AV) FISTULA CREATION ;  Surgeon: Angelia Mould, MD;  Location: [facility];  Service: Vascular;  Laterality: Right;  . [place] REMOVAL Left 10/09/2015  Procedure: REMOVAL OF ARTERIOVENOUS GORETEX GRAFT ([place]) Evacuation of Lymphocele, Vein Patch angioplasty of brachial artery.;  Surgeon: Angelia Mould, MD;  Location: [facility];  Service: Vascular;  Laterality: Left;  . BASCILIC VEIN TRANSPOSITION Right 02/26/2016   Procedure: Right BASCILIC VEIN TRANSPOSITION;  Surgeon: Angelia Mould, MD;  Location: [facility];  Service: Vascular;  Laterality: Right;  . CIRCUMCISION N/A 01/04/2014   Procedure: CIRCUMCISION ADULT (procedure #1);  Surgeon:  Marissa Nestle, MD;  Location: [facility];  Service: Urology;  Laterality: N/A;  . COLECTOMY N/A 05/04/2017   Procedure: TOTAL COLECTOMY;  Surgeon: Aviva Signs, MD;  Location: [facility];  Service: General;  Laterality: N/A;  . COLONOSCOPY N/A 04/27/2017   Procedure: COLONOSCOPY;  Surgeon: Daneil Dolin, MD;  [facility];  Service: Endoscopy;  Laterality: N/A;  245  . CYSTOSCOPY W/ RETROGRADES Bilateral 06/29/2015   Procedure: CYSTOSCOPY, DILATION OF URETHRAL STRICTURE WITH BILATERAL RETROGRADE PYELOGRAM,SUPRAPUBIC TUBE CHANGE;  Surgeon: Festus Aloe, MD;  Location: [facility];  Service: Urology;  Laterality: Bilateral;  . CYSTOSCOPY WITH URETHRAL DILATATION N/A 12/29/2013   Procedure: CYSTOSCOPY WITH URETHRAL DILATATION;  Surgeon: Marissa Nestle, MD;  Location: [facility];  Service: Urology;  Laterality: N/A;  . ESOPHAGOGASTRODUODENOSCOPY N/A 04/27/2017   Procedure: ESOPHAGOGASTRODUODENOSCOPY (EGD);  Surgeon: Daneil Dolin, MD;  [facility];  Service: Endoscopy;  Laterality: N/A;  . ORIF FEMUR FRACTURE Right 11/22/2016   Procedure: OPEN REDUCTION INTERNAL FIXATION (ORIF) DISTAL FEMUR FRACTURE;  Surgeon: Rod Can, MD;  Location: [facility];  Service: Orthopedics;  Laterality: Right;  . PERIPHERAL VASCULAR CATHETERIZATION N/A 10/08/2015   Procedure: A/V Shuntogram;  Surgeon: Angelia Mould, MD;  Location: [facility];  Service: Cardiovascular;  Laterality: N/A;  . TRANSURETHRAL RESECTION OF PROSTATE N/A 01/04/2014   Procedure: TRANSURETHRAL RESECTION OF THE PROSTATE (TURP) (procedure #2);  Surgeon: Marissa Nestle, MD;  Location: [facility];  Service: Urology;  Laterality: N/A;       Home Medications    Prior to Admission medications   Medication Sig Start Date End Date Taking? Authorizing Provider  Amino Acids-Protein Hydrolys (FEEDING SUPPLEMENT, PRO-STAT 64,) LIQD Take 30 mLs by mouth 2 (two) times daily between meals.    [provider]  atorvastatin  (LIPITOR) 40 MG tablet Take 40 mg by mouth every evening.    [provider]  enoxaparin (LOVENOX) 100 MG/ML injection Inject 85 mg into the skin every 12 (twelve) hours.    [provider]  levothyroxine (SYNTHROID, LEVOTHROID) 75 MCG tablet Take 75 mcg by mouth daily before breakfast.     [provider]  Multiple Vitamin (MULTIVITAMIN WITH MINERALS) TABS tablet Take 1 tablet by mouth every evening.     [provider]  Neo-Bacit-Poly-Lidocaine (FIRST AID PLUS LIDOCAINE EX) Apply 1 application topically Every Tuesday,Thursday,and Saturday with dialysis. Prior to dialysis    [provider]  Nutritional Supplements (NEPRO/CARBSTEADY PO) Take 1 Can by mouth 2 (two) times daily.    [provider]  omeprazole (PRILOSEC) 40 MG capsule Take 40 mg by mouth every evening.    [provider]  tamsulosin (FLOMAX) 0.4 MG CAPS capsule Take 0.4 mg by mouth daily at 8 pm. Give 30 minutes after a meal    [provider]  torsemide (DEMADEX) 10 MG tablet Take 5 tablets (50 mg total) by mouth daily. 04/07/16   [name], MD    Family History Family History  Problem Relation Age of Onset  .  Cancer Mother   . Colon cancer Neg Hx     Social History Social History  Substance Use Topics  . Smoking status: Never Smoker  . Smokeless tobacco: Never Used  . Alcohol use No     Comment: occ. use      Allergies   Patient has no known allergies.   Review of Systems Review of Systems  Unable to perform ROS: Other (Mental retardation)     Physical Exam Updated Vital Signs BP 96/64   Pulse (!) 109   Temp 98.7 F (37.1 C) (Oral)   Resp 20   Ht 6' (1.829 m)   Wt 86.2 kg (190 lb)   SpO2 97%   BMI 25.77 kg/m   Physical Exam  Nursing note and vitals reviewed.  71 year old male, resting comfortably and in no acute distress. Vital signs are significant for hypertension and mild tachycardia. Oxygen saturation is 97%, which is  normal. Head is normocephalic and atraumatic. PERRLA, EOMI. Oropharynx is clear. Neck is nontender and supple without adenopathy or JVD. Back is nontender and there is no CVA tenderness. Lungs are clear without rales, wheezes, or rhonchi. Chest is nontender. Heart has regular rate and rhythm without murmur. Abdomen is soft, flat, nontender without masses or hepatosplenomegaly and peristalsis is normoactive. Extremities: Dressing is present in the right upper arm which is soaked in blood. When removed, but is seen spurting from recent puncture site in his AV fistula. Skin is warm and dry without rash. Neurologic: He is awake and alert, cranial nerves are intact, there are no motor or sensory deficits.  ED Treatments / Results  Labs (all labs ordered are listed, but only abnormal results are displayed) Labs Reviewed  BASIC METABOLIC PANEL - Abnormal; Notable for the following:       Result Value   Potassium 3.3 (*)    Glucose, Bld 145 (*)    Creatinine, Ser 4.86 (*)    Calcium 8.6 (*)    GFR calc non Af Amer 11 (*)    GFR calc Af Amer 13 (*)    All other components within normal limits  CBC WITH DIFFERENTIAL/PLATELET - Abnormal; Notable for the following:    RBC 2.89 (*)    Hemoglobin 8.9 (*)    HCT 28.6 (*)    RDW 19.8 (*)    All other components within normal limits  I-STAT CG4 LACTIC ACID, ED - Abnormal; Notable for the following:    Lactic Acid, Venous 2.59 (*)    All other components within normal limits  TYPE AND SCREEN    Procedures Procedures (including critical care time) CRITICAL CARE Performed by: HENID,POEUM Total critical care time: 60 minutes Critical care time was exclusive of separately billable procedures and treating other patients. Critical care was necessary to treat or prevent imminent or life-threatening deterioration. Critical care was time spent personally by me on the following activities: development of treatment plan with patient and/or surrogate  as well as nursing, discussions with consultants, evaluation of patient's response to treatment, examination of patient, obtaining history from patient or surrogate, ordering and performing treatments and interventions, ordering and review of laboratory studies, ordering and review of radiographic studies, pulse oximetry and re-evaluation of patient's condition.  Medications Ordered in ED Medications - No data to display   Initial Impression / Assessment and Plan / ED Course  I have reviewed the triage vital signs and the nursing notes.  Pertinent labs & imaging results that were available during my  care of the patient were reviewed by me and considered in my medical decision making (see chart for details).  Bleeding from AV fistula. Old records are reviewed, and he has no similar prior visits. Pressure will be maintained for 20 minutes, and then fistula site will be reassessed. He is noted to be hypotensive, so screening labs are obtained. Review of recent office visits and hospitalization shows he tends to have a systolic blood pressure in the range of 100-110.  After 20 minutes of direct pressure, bleeding was reduced to a very slow ooze, but I did not stop completely. Additional pressure was held for 10 minutes, and then pressure dressing applied. He has not bled through this dressing. Blood pressure continued to stay low, and lactic acid level came back slightly elevated. Hemoglobin has actually increased over baseline. He is given IV fluid, and blood pressure has stabilized. He is discharged to return back to his skilled nursing facility.  Final Clinical Impressions(s) / ED Diagnoses   Final diagnoses:  Bleeding from dialysis shunt, initial encounter [facility])  End-stage renal disease on hemodialysis ([place])  Anemia in chronic kidney disease, on chronic dialysis (HCC)  Elevated lactic acid level    New Prescriptions New Prescriptions   No medications on file     Delora Fuel,  MD 69/50/72 [phone]

## 2017-05-30 NOTE — ED Triage Notes (Signed)
Pt brought over by staff from [facility]; pt was found to be actively bleeding; pt had dialysis today; staff reports pt has bled through 2 pressure dressings

## 2017-05-30 NOTE — ED Notes (Signed)
Date and time results received: 05/30/17 @23 :40  Test: I stat lactic acid 2.59 Critical Value: 2.59  Name of Provider Notified: Dr Roxanne Mins   Orders Received? Or Actions Taken?: additional orders received,

## 2017-05-30 NOTE — ED Notes (Signed)
Pt sent from [facility] where it was discovered "on ronds" that pt was bleeding from his dialysis site on the R side and the room " was full of blood"

## 2017-05-31 ENCOUNTER — Inpatient Hospital Stay
Admission: RE | Admit: 2017-05-31 | Discharge: 2017-06-02 | Disposition: A | Discharge status: Nursing Home (Includes ICF) | Payer: Medicare Other | Source: Ambulatory Visit | Attending: Internal Medicine | Admitting: Internal Medicine

## 2017-05-31 ENCOUNTER — Encounter (HOSPITAL_COMMUNITY): Payer: Self-pay | Admitting: *Deleted

## 2017-05-31 LAB — TYPE AND SCREEN
ABO/RH(D): B POS
Antibody Screen: NEGATIVE

## 2017-05-31 MED ORDER — SODIUM CHLORIDE 0.9 % IV BOLUS (SEPSIS)
1000.0000 mL | Freq: Once | INTRAVENOUS | Status: AC
  Administered 2017-05-31: 1000 mL via INTRAVENOUS

## 2017-05-31 NOTE — ED Notes (Signed)
Pt updated, denies any complaints,

## 2017-05-31 NOTE — ED Notes (Addendum)
Pt resting will arouse when spoken to, no bleeding from right arm noted,

## 2017-05-31 NOTE — ED Notes (Signed)
[place] dressing to right upper arm still intact, no bleeding noted, pt denies any problems with dressing,

## 2017-05-31 NOTE — ED Notes (Signed)
[place] dressing applied, no further bleeding noted,

## 2017-05-31 NOTE — Discharge Instructions (Signed)
Keep dressing on for another 12 hours.  Resume your normal dialysis schedule.

## 2017-05-31 NOTE — ED Notes (Signed)
Pressure applied to right upper arm dialysis shunt by Otila Kluver RN Dr Roxanne Mins at bedside,

## 2017-05-31 NOTE — ED Notes (Signed)
Report given to [facility] staff

## 2017-05-31 NOTE — ED Notes (Signed)
[facility] staff here to pick pt up,

## 2017-06-02 ENCOUNTER — Inpatient Hospital Stay (HOSPITAL_COMMUNITY)
Admission: EM | Admit: 2017-06-02 | Discharge: 2017-06-10 | DRG: 811 | Disposition: A | Discharge status: Skilled Nursing Facility | Payer: Medicare Other | Attending: Internal Medicine | Admitting: Internal Medicine

## 2017-06-02 DIAGNOSIS — N186 End stage renal disease: Secondary | ICD-10-CM | POA: Diagnosis present

## 2017-06-02 DIAGNOSIS — M898X9 Other specified disorders of bone, unspecified site: Secondary | ICD-10-CM | POA: Diagnosis present

## 2017-06-02 DIAGNOSIS — Z6823 Body mass index (BMI) 23.0-23.9, adult: Secondary | ICD-10-CM

## 2017-06-02 DIAGNOSIS — Z86718 Personal history of other venous thrombosis and embolism: Secondary | ICD-10-CM

## 2017-06-02 DIAGNOSIS — D631 Anemia in chronic kidney disease: Secondary | ICD-10-CM | POA: Diagnosis present

## 2017-06-02 DIAGNOSIS — J9811 Atelectasis: Secondary | ICD-10-CM | POA: Diagnosis present

## 2017-06-02 DIAGNOSIS — F79 Unspecified intellectual disabilities: Secondary | ICD-10-CM

## 2017-06-02 DIAGNOSIS — Z9115 Patient's noncompliance with renal dialysis: Secondary | ICD-10-CM

## 2017-06-02 DIAGNOSIS — Z79899 Other long term (current) drug therapy: Secondary | ICD-10-CM

## 2017-06-02 DIAGNOSIS — K219 Gastro-esophageal reflux disease without esophagitis: Secondary | ICD-10-CM | POA: Diagnosis present

## 2017-06-02 DIAGNOSIS — I120 Hypertensive chronic kidney disease with stage 5 chronic kidney disease or end stage renal disease: Secondary | ICD-10-CM | POA: Diagnosis present

## 2017-06-02 DIAGNOSIS — R197 Diarrhea, unspecified: Secondary | ICD-10-CM | POA: Diagnosis present

## 2017-06-02 DIAGNOSIS — E43 Unspecified severe protein-calorie malnutrition: Secondary | ICD-10-CM | POA: Diagnosis present

## 2017-06-02 DIAGNOSIS — E039 Hypothyroidism, unspecified: Secondary | ICD-10-CM | POA: Diagnosis present

## 2017-06-02 DIAGNOSIS — E1122 Type 2 diabetes mellitus with diabetic chronic kidney disease: Secondary | ICD-10-CM | POA: Diagnosis present

## 2017-06-02 DIAGNOSIS — Z7901 Long term (current) use of anticoagulants: Secondary | ICD-10-CM

## 2017-06-02 DIAGNOSIS — R509 Fever, unspecified: Secondary | ICD-10-CM | POA: Diagnosis present

## 2017-06-02 DIAGNOSIS — N189 Chronic kidney disease, unspecified: Secondary | ICD-10-CM

## 2017-06-02 DIAGNOSIS — E871 Hypo-osmolality and hyponatremia: Secondary | ICD-10-CM | POA: Diagnosis present

## 2017-06-02 DIAGNOSIS — D62 Acute posthemorrhagic anemia: Principal | ICD-10-CM | POA: Diagnosis present

## 2017-06-02 DIAGNOSIS — E785 Hyperlipidemia, unspecified: Secondary | ICD-10-CM | POA: Diagnosis present

## 2017-06-02 DIAGNOSIS — Z9049 Acquired absence of other specified parts of digestive tract: Secondary | ICD-10-CM

## 2017-06-02 DIAGNOSIS — N400 Enlarged prostate without lower urinary tract symptoms: Secondary | ICD-10-CM | POA: Diagnosis present

## 2017-06-03 ENCOUNTER — Encounter (HOSPITAL_COMMUNITY): Payer: Self-pay | Admitting: Emergency Medicine

## 2017-06-03 ENCOUNTER — Emergency Department (HOSPITAL_COMMUNITY): Payer: Medicare Other

## 2017-06-03 DIAGNOSIS — R0989 Other specified symptoms and signs involving the circulatory and respiratory systems: Secondary | ICD-10-CM | POA: Diagnosis not present

## 2017-06-03 DIAGNOSIS — N186 End stage renal disease: Secondary | ICD-10-CM | POA: Diagnosis not present

## 2017-06-03 DIAGNOSIS — M898X9 Other specified disorders of bone, unspecified site: Secondary | ICD-10-CM | POA: Diagnosis present

## 2017-06-03 DIAGNOSIS — R197 Diarrhea, unspecified: Secondary | ICD-10-CM | POA: Diagnosis present

## 2017-06-03 DIAGNOSIS — N400 Enlarged prostate without lower urinary tract symptoms: Secondary | ICD-10-CM | POA: Diagnosis present

## 2017-06-03 DIAGNOSIS — Z9049 Acquired absence of other specified parts of digestive tract: Secondary | ICD-10-CM | POA: Diagnosis not present

## 2017-06-03 DIAGNOSIS — Z992 Dependence on renal dialysis: Secondary | ICD-10-CM | POA: Diagnosis not present

## 2017-06-03 DIAGNOSIS — Z7901 Long term (current) use of anticoagulants: Secondary | ICD-10-CM | POA: Diagnosis not present

## 2017-06-03 DIAGNOSIS — F79 Unspecified intellectual disabilities: Secondary | ICD-10-CM | POA: Diagnosis not present

## 2017-06-03 DIAGNOSIS — E039 Hypothyroidism, unspecified: Secondary | ICD-10-CM | POA: Diagnosis present

## 2017-06-03 DIAGNOSIS — R279 Unspecified lack of coordination: Secondary | ICD-10-CM | POA: Diagnosis not present

## 2017-06-03 DIAGNOSIS — D631 Anemia in chronic kidney disease: Secondary | ICD-10-CM | POA: Diagnosis not present

## 2017-06-03 DIAGNOSIS — I120 Hypertensive chronic kidney disease with stage 5 chronic kidney disease or end stage renal disease: Secondary | ICD-10-CM | POA: Diagnosis present

## 2017-06-03 DIAGNOSIS — Z9181 History of falling: Secondary | ICD-10-CM | POA: Diagnosis not present

## 2017-06-03 DIAGNOSIS — N3289 Other specified disorders of bladder: Secondary | ICD-10-CM | POA: Diagnosis not present

## 2017-06-03 DIAGNOSIS — Z86718 Personal history of other venous thrombosis and embolism: Secondary | ICD-10-CM | POA: Diagnosis not present

## 2017-06-03 DIAGNOSIS — R509 Fever, unspecified: Secondary | ICD-10-CM | POA: Diagnosis not present

## 2017-06-03 DIAGNOSIS — R000 Tachycardia, unspecified: Secondary | ICD-10-CM | POA: Diagnosis not present

## 2017-06-03 DIAGNOSIS — N189 Chronic kidney disease, unspecified: Secondary | ICD-10-CM

## 2017-06-03 DIAGNOSIS — D509 Iron deficiency anemia, unspecified: Secondary | ICD-10-CM | POA: Diagnosis not present

## 2017-06-03 DIAGNOSIS — E43 Unspecified severe protein-calorie malnutrition: Secondary | ICD-10-CM | POA: Diagnosis present

## 2017-06-03 DIAGNOSIS — D500 Iron deficiency anemia secondary to blood loss (chronic): Secondary | ICD-10-CM | POA: Diagnosis not present

## 2017-06-03 DIAGNOSIS — A419 Sepsis, unspecified organism: Secondary | ICD-10-CM | POA: Diagnosis not present

## 2017-06-03 DIAGNOSIS — J9811 Atelectasis: Secondary | ICD-10-CM | POA: Diagnosis present

## 2017-06-03 DIAGNOSIS — N401 Enlarged prostate with lower urinary tract symptoms: Secondary | ICD-10-CM | POA: Diagnosis not present

## 2017-06-03 DIAGNOSIS — D62 Acute posthemorrhagic anemia: Secondary | ICD-10-CM | POA: Diagnosis not present

## 2017-06-03 DIAGNOSIS — K219 Gastro-esophageal reflux disease without esophagitis: Secondary | ICD-10-CM | POA: Diagnosis present

## 2017-06-03 DIAGNOSIS — D649 Anemia, unspecified: Secondary | ICD-10-CM | POA: Diagnosis not present

## 2017-06-03 DIAGNOSIS — I10 Essential (primary) hypertension: Secondary | ICD-10-CM | POA: Diagnosis not present

## 2017-06-03 DIAGNOSIS — E871 Hypo-osmolality and hyponatremia: Secondary | ICD-10-CM | POA: Diagnosis not present

## 2017-06-03 DIAGNOSIS — M6281 Muscle weakness (generalized): Secondary | ICD-10-CM | POA: Diagnosis not present

## 2017-06-03 DIAGNOSIS — E785 Hyperlipidemia, unspecified: Secondary | ICD-10-CM | POA: Diagnosis present

## 2017-06-03 DIAGNOSIS — S72421D Displaced fracture of lateral condyle of right femur, subsequent encounter for closed fracture with routine healing: Secondary | ICD-10-CM | POA: Diagnosis not present

## 2017-06-03 DIAGNOSIS — E1129 Type 2 diabetes mellitus with other diabetic kidney complication: Secondary | ICD-10-CM | POA: Diagnosis not present

## 2017-06-03 DIAGNOSIS — Z6823 Body mass index (BMI) 23.0-23.9, adult: Secondary | ICD-10-CM | POA: Diagnosis not present

## 2017-06-03 DIAGNOSIS — R5081 Fever presenting with conditions classified elsewhere: Secondary | ICD-10-CM | POA: Diagnosis not present

## 2017-06-03 DIAGNOSIS — E1122 Type 2 diabetes mellitus with diabetic chronic kidney disease: Secondary | ICD-10-CM | POA: Diagnosis present

## 2017-06-03 DIAGNOSIS — Z79899 Other long term (current) drug therapy: Secondary | ICD-10-CM | POA: Diagnosis not present

## 2017-06-03 DIAGNOSIS — Z9115 Patient's noncompliance with renal dialysis: Secondary | ICD-10-CM | POA: Diagnosis not present

## 2017-06-03 DIAGNOSIS — F70 Mild intellectual disabilities: Secondary | ICD-10-CM | POA: Diagnosis not present

## 2017-06-03 LAB — CBC WITH DIFFERENTIAL/PLATELET
Basophils Absolute: 0 10*3/uL (ref 0.0–0.1)
Basophils Relative: 0 %
Eosinophils Absolute: 0 10*3/uL (ref 0.0–0.7)
Eosinophils Relative: 1 %
HCT: 18.4 % — ABNORMAL LOW (ref 39.0–52.0)
Hemoglobin: 5.8 g/dL — CL (ref 13.0–17.0)
Lymphocytes Relative: 22 %
Lymphs Abs: 1.4 10*3/uL (ref 0.7–4.0)
MCH: 30.2 pg (ref 26.0–34.0)
MCHC: 31.5 g/dL (ref 30.0–36.0)
MCV: 95.8 fL (ref 78.0–100.0)
Monocytes Absolute: 1.2 10*3/uL — ABNORMAL HIGH (ref 0.1–1.0)
Monocytes Relative: 17 %
Neutro Abs: 4 10*3/uL (ref 1.7–7.7)
Neutrophils Relative %: 61 %
Platelets: 280 10*3/uL (ref 150–400)
RBC: 1.92 MIL/uL — ABNORMAL LOW (ref 4.22–5.81)
RDW: 19.7 % — ABNORMAL HIGH (ref 11.5–15.5)
WBC: 6.6 10*3/uL (ref 4.0–10.5)

## 2017-06-03 LAB — GLUCOSE, CAPILLARY: Glucose-Capillary: 79 mg/dL (ref 65–99)

## 2017-06-03 LAB — CBC
HCT: 19.8 % — ABNORMAL LOW (ref 39.0–52.0)
HCT: 28.7 % — ABNORMAL LOW (ref 39.0–52.0)
Hemoglobin: 6.2 g/dL — CL (ref 13.0–17.0)
Hemoglobin: 9.4 g/dL — ABNORMAL LOW (ref 13.0–17.0)
MCH: 30.4 pg (ref 26.0–34.0)
MCH: 29.9 pg (ref 26.0–34.0)
MCHC: 31.3 g/dL (ref 30.0–36.0)
MCHC: 32.8 g/dL (ref 30.0–36.0)
MCV: 97.1 fL (ref 78.0–100.0)
MCV: 91.4 fL (ref 78.0–100.0)
Platelets: 277 10*3/uL (ref 150–400)
Platelets: 237 10*3/uL (ref 150–400)
RBC: 2.04 MIL/uL — ABNORMAL LOW (ref 4.22–5.81)
RBC: 3.14 MIL/uL — ABNORMAL LOW (ref 4.22–5.81)
RDW: 19.8 % — ABNORMAL HIGH (ref 11.5–15.5)
RDW: 17.3 % — ABNORMAL HIGH (ref 11.5–15.5)
WBC: 6 10*3/uL (ref 4.0–10.5)
WBC: 6.3 10*3/uL (ref 4.0–10.5)

## 2017-06-03 LAB — COMPREHENSIVE METABOLIC PANEL
ALT: 15 U/L — ABNORMAL LOW (ref 17–63)
AST: 16 U/L (ref 15–41)
Albumin: 2.4 g/dL — ABNORMAL LOW (ref 3.5–5.0)
Alkaline Phosphatase: 87 U/L (ref 38–126)
Anion gap: 14 (ref 5–15)
BUN: 54 mg/dL — ABNORMAL HIGH (ref 6–20)
CO2: 21 mmol/L — ABNORMAL LOW (ref 22–32)
Calcium: 7.1 mg/dL — ABNORMAL LOW (ref 8.9–10.3)
Chloride: 96 mmol/L — ABNORMAL LOW (ref 101–111)
Creatinine, Ser: 7.33 mg/dL — ABNORMAL HIGH (ref 0.61–1.24)
GFR calc Af Amer: 8 mL/min — ABNORMAL LOW (ref >60)
GFR calc non Af Amer: 7 mL/min — ABNORMAL LOW (ref >60)
Glucose, Bld: 117 mg/dL — ABNORMAL HIGH (ref 65–99)
Potassium: 3.3 mmol/L — ABNORMAL LOW (ref 3.5–5.1)
Sodium: 131 mmol/L — ABNORMAL LOW (ref 135–145)
Total Bilirubin: 0.5 mg/dL (ref 0.3–1.2)
Total Protein: 6.5 g/dL (ref 6.5–8.1)

## 2017-06-03 LAB — BASIC METABOLIC PANEL
Anion gap: 17 — ABNORMAL HIGH (ref 5–15)
BUN: 57 mg/dL — ABNORMAL HIGH (ref 6–20)
CO2: 18 mmol/L — ABNORMAL LOW (ref 22–32)
Calcium: 7.4 mg/dL — ABNORMAL LOW (ref 8.9–10.3)
Chloride: 99 mmol/L — ABNORMAL LOW (ref 101–111)
Creatinine, Ser: 7.41 mg/dL — ABNORMAL HIGH (ref 0.61–1.24)
GFR calc Af Amer: 8 mL/min — ABNORMAL LOW (ref >60)
GFR calc non Af Amer: 7 mL/min — ABNORMAL LOW (ref >60)
Glucose, Bld: 109 mg/dL — ABNORMAL HIGH (ref 65–99)
Potassium: 3.6 mmol/L (ref 3.5–5.1)
Sodium: 134 mmol/L — ABNORMAL LOW (ref 135–145)

## 2017-06-03 LAB — URINALYSIS, ROUTINE W REFLEX MICROSCOPIC
Glucose, UA: NEGATIVE mg/dL
Hgb urine dipstick: NEGATIVE
Ketones, ur: NEGATIVE mg/dL
Leukocytes, UA: NEGATIVE
Nitrite: NEGATIVE
Protein, ur: NEGATIVE mg/dL
Specific Gravity, Urine: 1.017 (ref 1.005–1.030)
pH: 5 (ref 5.0–8.0)

## 2017-06-03 LAB — MRSA PCR SCREENING: MRSA by PCR: POSITIVE — AB

## 2017-06-03 LAB — PREPARE RBC (CROSSMATCH)

## 2017-06-03 LAB — I-STAT CG4 LACTIC ACID, ED: Lactic Acid, Venous: 1.19 mmol/L (ref 0.5–1.9)

## 2017-06-03 MED ORDER — CHLORHEXIDINE GLUCONATE CLOTH 2 % EX PADS
6.0000 | MEDICATED_PAD | Freq: Every day | CUTANEOUS | Status: AC
  Administered 2017-06-03 – 2017-06-07 (×5): 6 via TOPICAL

## 2017-06-03 MED ORDER — ACETAMINOPHEN 650 MG RE SUPP: 650.0000 mg | Freq: Four times a day (QID) | RECTAL | Status: DC | PRN

## 2017-06-03 MED ORDER — SODIUM CHLORIDE 0.9 % IV SOLN: 100.0000 mL | INTRAVENOUS | Status: DC | PRN

## 2017-06-03 MED ORDER — VANCOMYCIN HCL IN DEXTROSE 750-5 MG/150ML-% IV SOLN
750.0000 mg | INTRAVENOUS | Status: DC
  Administered 2017-06-03 – 2017-06-08 (×3): 750 mg via INTRAVENOUS
  Filled 2017-06-03 (×4): qty 150

## 2017-06-03 MED ORDER — SODIUM CHLORIDE 0.9% FLUSH: 3.0000 mL | INTRAVENOUS | Status: DC | PRN

## 2017-06-03 MED ORDER — EPOETIN ALFA 10000 UNIT/ML IJ SOLN
10000.0000 [IU] | INTRAMUSCULAR | Status: DC
  Administered 2017-06-03 – 2017-06-10 (×4): 10000 [IU] via INTRAVENOUS
  Filled 2017-06-03 (×6): qty 1

## 2017-06-03 MED ORDER — VANCOMYCIN HCL IN DEXTROSE 1-5 GM/200ML-% IV SOLN
1000.0000 mg | Freq: Once | INTRAVENOUS | Status: DC
  Administered 2017-06-03: 1000 mg via INTRAVENOUS
  Filled 2017-06-03: qty 200

## 2017-06-03 MED ORDER — PENTAFLUOROPROP-TETRAFLUOROETH EX AERO
1.0000 "application " | INHALATION_SPRAY | CUTANEOUS | Status: DC | PRN
  Filled 2017-06-03: qty 30

## 2017-06-03 MED ORDER — ACETAMINOPHEN 325 MG PO TABS: 650.0000 mg | ORAL_TABLET | Freq: Once | ORAL | Status: DC

## 2017-06-03 MED ORDER — LIDOCAINE-PRILOCAINE 2.5-2.5 % EX CREA
1.0000 "application " | TOPICAL_CREAM | CUTANEOUS | Status: DC | PRN
  Filled 2017-06-03: qty 5

## 2017-06-03 MED ORDER — LIDOCAINE HCL (PF) 1 % IJ SOLN: 5.0000 mL | INTRAMUSCULAR | Status: DC | PRN

## 2017-06-03 MED ORDER — SODIUM CHLORIDE 0.9% FLUSH
3.0000 mL | Freq: Two times a day (BID) | INTRAVENOUS | Status: DC
  Administered 2017-06-03 – 2017-06-09 (×9): 3 mL via INTRAVENOUS

## 2017-06-03 MED ORDER — MUPIROCIN 2 % EX OINT
1.0000 "application " | TOPICAL_OINTMENT | Freq: Two times a day (BID) | CUTANEOUS | Status: AC
  Administered 2017-06-03 – 2017-06-07 (×10): 1 via NASAL
  Filled 2017-06-03 (×2): qty 22

## 2017-06-03 MED ORDER — TORSEMIDE 20 MG PO TABS
50.0000 mg | ORAL_TABLET | Freq: Every day | ORAL | Status: DC
  Administered 2017-06-03 – 2017-06-05 (×3): 50 mg via ORAL
  Filled 2017-06-03: qty 1
  Filled 2017-06-03: qty 3
  Filled 2017-06-03: qty 1

## 2017-06-03 MED ORDER — EPOETIN ALFA 10000 UNIT/ML IJ SOLN
INTRAMUSCULAR | Status: AC
  Filled 2017-06-03: qty 1

## 2017-06-03 MED ORDER — ACETAMINOPHEN 325 MG PO TABS
650.0000 mg | ORAL_TABLET | Freq: Four times a day (QID) | ORAL | Status: DC | PRN
  Administered 2017-06-03 – 2017-06-06 (×4): 650 mg via ORAL
  Filled 2017-06-03 (×4): qty 2

## 2017-06-03 MED ORDER — VANCOMYCIN HCL IN DEXTROSE 1-5 GM/200ML-% IV SOLN: 1000.0000 mg | Freq: Once | INTRAVENOUS | Status: DC

## 2017-06-03 MED ORDER — SODIUM CHLORIDE 0.9 % IV SOLN: Freq: Once | INTRAVENOUS | Status: DC

## 2017-06-03 MED ORDER — PIPERACILLIN-TAZOBACTAM 3.375 G IVPB 30 MIN
3.3750 g | Freq: Once | INTRAVENOUS | Status: AC
  Administered 2017-06-03: 3.375 g via INTRAVENOUS
  Filled 2017-06-03: qty 50

## 2017-06-03 MED ORDER — SODIUM CHLORIDE 0.9 % IV SOLN
250.0000 mL | INTRAVENOUS | Status: DC | PRN
  Administered 2017-06-06: 250 mL via INTRAVENOUS

## 2017-06-03 MED ORDER — INSULIN ASPART 100 UNIT/ML ~~LOC~~ SOLN
0.0000 [IU] | Freq: Three times a day (TID) | SUBCUTANEOUS | Status: DC
  Administered 2017-06-03 (×2): 2 [IU] via SUBCUTANEOUS
  Administered 2017-06-07 – 2017-06-10 (×4): 1 [IU] via SUBCUTANEOUS

## 2017-06-03 MED ORDER — PIPERACILLIN-TAZOBACTAM 3.375 G IVPB
3.3750 g | Freq: Two times a day (BID) | INTRAVENOUS | Status: DC
  Administered 2017-06-03 – 2017-06-10 (×13): 3.375 g via INTRAVENOUS
  Filled 2017-06-03 (×13): qty 50

## 2017-06-03 NOTE — ED Notes (Signed)
Date and time results received: 06/03/17   0144 (use smartphrase ".now" to insert current time)  Test: Hemoglobin Critical Value: 5.8  Name of Provider Notified: [place]  Orders Received? Or Actions Taken?: notified

## 2017-06-03 NOTE — ED Notes (Signed)
Pt informed of need for urine sample. Pt states he does not have to go at this time but was given a urinal and instructed to inform nursing staff when able to provide sample

## 2017-06-03 NOTE — Progress Notes (Signed)
Patient seen and examined, database reviewed. Patient of Dr. Legrand Rams and he will assume care in a.m. He was sent from his SNF due to a fever. He has a history of mental retardation and end-stage renal disease on dialysis. For unknown reasons he misses dialysis on the day prior to admission. He was noticed to have a hemoglobin of 6.2. Agree with continuing broad-spectrum antibiotics today pending culture data. Source of fever remains unclear. Has had no temperatures since admission. Nephrology has been consulted for dialysis needs. Have requested he be transfused 2 units of PRBCs.  Domingo Mend, MD [facility] Pager: [phone]

## 2017-06-03 NOTE — ED Provider Notes (Addendum)
[facility] DEPT Provider Note   CSN: [id number] Arrival date & time: 06/02/17  2353     History   Chief Complaint Chief Complaint  Patient presents with  . Fever    HPI Zachary [name] is a 71 y.o. male.  HPI  This is a 71 year old male with a history of end-stage renal disease, hyperkalemia, hypertension, mental retardation, recent sepsis who presents with fever. Patient presents from the [facility] with fever to 101.1. Heart rate was 120. No other additional symptoms reported. On my evaluation, history is limited as patient is poor historian. He reports some pain in the upper abdomen. He reports diarrhea. No vomiting. He did miss dialysis today because "I didn't feel good." Reports cough without chest pain or shortness of breath.  During recent hospitalization, he was given broad-spectrum antibiotics. Blood cultures negative. C. difficile negative. He also has had 2 recent abdominal CTs to suggest colitis-like picture.  Level V caveat.  Past Medical History:  Diagnosis Date  . Abnormal CT scan, kidney 10/06/2011  . Acute pyelonephritis 10/07/2011  . Anemia    normocytic  . Anxiety    mental retardation  . Bladder wall thickening 10/06/2011  . BPH (benign prostatic hypertrophy)   . Diabetes mellitus   . Dialysis patient [facility])    Tuesday, Thursday and Saturday,   . Edema     history of lower extremity edema  . GERD (gastroesophageal reflux disease)   . Heme positive stool   . Hydronephrosis   . Hyperkalemia   . Hyperlipidemia   . Hypernatremia   . Hypertension   . Hypothyroidism   . Impaired speech   . 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 MR (mental retardation)   . Muscle weakness   . Obstructive uropathy   . 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 Poor historian poor historian  . Protein calorie malnutrition ([place])   . Pyelonephritis   . Renal failure (ARF), acute on chronic (HCC)   . Renal insufficiency    chronic history  . Sepsis ([place])   . Smoking    . Uremia   . Urinary retention   . UTI (lower urinary tract infection) 10/06/2011    Patient Active Problem List   Diagnosis Date Noted  . Sepsis ([place]) 05/17/2017  . Colitis 05/17/2017  . Dysplastic polyp of colon   . Acute blood loss anemia 05/01/2017  . ESRD (end stage renal disease) ([place]) 05/01/2017  . Anemia in CKD (chronic kidney disease) 05/01/2017  . Colonoscopy causing post-procedural bleeding   . Colonic mass   . Lower GI bleed   . Chronic anticoagulation 02/18/2017  . Anticoagulation management encounter 12/29/2016  . DVT of leg (deep venous thrombosis) ([place]) 12/25/2016  . Closed comminuted intra-articular fracture of distal femur, right, initial encounter ([place]) 11/22/2016  . Right femoral fracture ([place]) 11/20/2016  . Hydronephrosis, bilateral 03/10/2016  . 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 Heme positive stool   . Abnormal CT scan, esophagus   . Protein-calorie malnutrition, severe ([place]) 07/10/2015  . Essential hypertension 07/08/2015  . Hyperlipidemia 07/08/2015  . Obstructive uropathy 04/17/2015  . Bladder neck contracture   . Anemia, chronic disease 04/16/2015  . Benign prostatic hyperplasia with urinary retention 03/03/2015  . Mental retardation 10/10/2011  . Acute pyelonephritis 10/07/2011  . 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 Bladder wall thickening 10/06/2011  . Iron deficiency anemia 10/02/2011  . Diabetes mellitus ([place]) 10/01/2011  . FX CLOSED FIBULA NOS 02/10/2008    Past Surgical  History:  Procedure Laterality Date  . AV FISTULA PLACEMENT Left 07/06/2015   Procedure:  INSERTION LEFT ARM ARTERIOVENOUS GORTEX GRAFT;  Surgeon: Angelia Mould, MD;  Location: [place];  Service: Vascular;  Laterality: Left;  . AV FISTULA PLACEMENT Right 02/26/2016   Procedure: ARTERIOVENOUS (AV) FISTULA CREATION ;  Surgeon: Angelia Mould, MD;  Location: [place] [facility];  Service: Vascular;  Laterality: Right;  . [place] REMOVAL Left 10/09/2015    Procedure: REMOVAL OF ARTERIOVENOUS GORETEX GRAFT ([place]) Evacuation of Lymphocele, Vein Patch angioplasty of brachial artery.;  Surgeon: Angelia Mould, MD;  Location: [place];  Service: Vascular;  Laterality: Left;  . BASCILIC VEIN TRANSPOSITION Right 02/26/2016   Procedure: Right BASCILIC VEIN TRANSPOSITION;  Surgeon: Angelia Mould, MD;  Location: [place];  Service: Vascular;  Laterality: Right;  . CIRCUMCISION N/A 01/04/2014   Procedure: CIRCUMCISION ADULT (procedure #1);  Surgeon: Marissa Nestle, MD;  Location: [facility];  Service: Urology;  Laterality: N/A;  . COLECTOMY N/A 05/04/2017   Procedure: TOTAL COLECTOMY;  Surgeon: Aviva Signs, MD;  Location: [facility];  Service: General;  Laterality: N/A;  . COLONOSCOPY N/A 04/27/2017   Procedure: COLONOSCOPY;  Surgeon: Daneil Dolin, MD;  [facility];  Service: Endoscopy;  Laterality: N/A;  245  . CYSTOSCOPY W/ RETROGRADES Bilateral 06/29/2015   Procedure: CYSTOSCOPY, DILATION OF URETHRAL STRICTURE WITH BILATERAL RETROGRADE PYELOGRAM,SUPRAPUBIC TUBE CHANGE;  Surgeon: Festus Aloe, MD;  Location: [facility];  Service: Urology;  Laterality: Bilateral;  . CYSTOSCOPY WITH URETHRAL DILATATION N/A 12/29/2013   Procedure: CYSTOSCOPY WITH URETHRAL DILATATION;  Surgeon: Marissa Nestle, MD;  Location: [facility];  Service: Urology;  Laterality: N/A;  . ESOPHAGOGASTRODUODENOSCOPY N/A 04/27/2017   Procedure: ESOPHAGOGASTRODUODENOSCOPY (EGD);  Surgeon: Daneil Dolin, MD;  [facility];  Service: Endoscopy;  Laterality: N/A;  . ORIF FEMUR FRACTURE Right 11/22/2016   Procedure: OPEN REDUCTION INTERNAL FIXATION (ORIF) DISTAL FEMUR FRACTURE;  Surgeon: Rod Can, MD;  Location: [facility];  Service: Orthopedics;  Laterality: Right;  . PERIPHERAL VASCULAR CATHETERIZATION N/A 10/08/2015   Procedure: A/V Shuntogram;  Surgeon: Angelia Mould, MD;  Location: [place] [facility];  Service: Cardiovascular;  Laterality: N/A;  . TRANSURETHRAL  RESECTION OF PROSTATE N/A 01/04/2014   Procedure: TRANSURETHRAL RESECTION OF THE PROSTATE (TURP) (procedure #2);  Surgeon: Marissa Nestle, MD;  Location: [facility];  Service: Urology;  Laterality: N/A;       Home Medications    Prior to Admission medications   Medication Sig Start Date End Date Taking? Authorizing Provider  Amino Acids-Protein Hydrolys (FEEDING SUPPLEMENT, PRO-STAT 64,) LIQD Take 30 mLs by mouth 2 (two) times daily between meals.    [provider]  atorvastatin (LIPITOR) 40 MG tablet Take 40 mg by mouth every evening.    [provider]  enoxaparin (LOVENOX) 100 MG/ML injection Inject 85 mg into the skin every 12 (twelve) hours.    [provider]  ferrous sulfate 325 (65 FE) MG tablet Take 325 mg by mouth daily with breakfast.    [provider]  levothyroxine (SYNTHROID, LEVOTHROID) 75 MCG tablet Take 75 mcg by mouth daily before breakfast.     [provider]  Multiple Vitamin (MULTIVITAMIN WITH MINERALS) TABS tablet Take 1 tablet by mouth every evening.     [provider]  Neo-Bacit-Poly-Lidocaine (FIRST AID PLUS LIDOCAINE EX) Apply 1 application topically Every Tuesday,Thursday,and Saturday with dialysis. Prior to dialysis    [provider]  Nutritional  Supplements (NEPRO/CARBSTEADY PO) Take 1 Can by mouth 2 (two) times daily.    [provider]  omeprazole (PRILOSEC) 40 MG capsule Take 40 mg by mouth every evening.    [provider]  tamsulosin (FLOMAX) 0.4 MG CAPS capsule Take 0.4 mg by mouth daily at 8 pm. Give 30 minutes after a meal    [provider]  torsemide (DEMADEX) 10 MG tablet Take 5 tablets (50 mg total) by mouth daily. 04/07/16   [name], MD    Family History Family History  Problem Relation Age of Onset  . Cancer Mother   . Colon cancer Neg Hx     Social History Social History  Substance Use Topics  . Smoking status: Never Smoker  . Smokeless  tobacco: Never Used  . Alcohol use No     Comment: occ. use      Allergies   Patient has no known allergies.   Review of Systems Review of Systems  Constitutional: Positive for fever.  Respiratory: Positive for cough. Negative for shortness of breath.   Cardiovascular: Negative for chest pain.  Gastrointestinal: Positive for abdominal pain and diarrhea. Negative for nausea and vomiting.  Genitourinary: Negative for dysuria.  Skin: Negative for rash.  All other systems reviewed and are negative.    Physical Exam Updated Vital Signs BP 107/63   Pulse (!) 113   Temp 100 F (37.8 C) (Oral)   Resp 13   Ht 6' (1.829 m)   Wt 86.2 kg (190 lb)   SpO2 100%   BMI 25.77 kg/m   Physical Exam  Constitutional: He is oriented to person, place, and time. He appears well-developed and well-nourished. No distress.  Chronically ill-appearing, no acute distress  HENT:  Head: Normocephalic and atraumatic.  Eyes: Pupils are equal, round, and reactive to light.  Cardiovascular: Regular rhythm and normal heart sounds.   No murmur heard. Tachycardia  Pulmonary/Chest: Effort normal and breath sounds normal. No respiratory distress. He has no wheezes.  Abdominal: Soft. There is no tenderness. There is no rebound.  Hyperactive bowel sounds, bruising noted over the left abdomen  Musculoskeletal: He exhibits no edema.  Fistula right upper extremity  Neurological: He is alert and oriented to person, place, and time.  Skin: Skin is warm and dry. No rash noted.  Psychiatric: He has a normal mood and affect.  Nursing note and vitals reviewed.    ED Treatments / Results  Labs (all labs ordered are listed, but only abnormal results are displayed) Labs Reviewed  COMPREHENSIVE METABOLIC PANEL - Abnormal; Notable for the following:       Result Value   Sodium 131 (*)    Potassium 3.3 (*)    Chloride 96 (*)    CO2 21 (*)    Glucose, Bld 117 (*)    BUN 54 (*)    Creatinine, Ser 7.33 (*)     Calcium 7.1 (*)    Albumin 2.4 (*)    ALT 15 (*)    GFR calc non Af Amer 7 (*)    GFR calc Af Amer 8 (*)    All other components within normal limits  CBC WITH DIFFERENTIAL/PLATELET - Abnormal; Notable for the following:    RBC 1.92 (*)    Hemoglobin 5.8 (*)    HCT 18.4 (*)    RDW 19.7 (*)    Monocytes Absolute 1.2 (*)    All other components within normal limits  CULTURE, BLOOD (ROUTINE X 2)  CULTURE,  BLOOD (ROUTINE X 2)  URINALYSIS, ROUTINE W REFLEX MICROSCOPIC  I-STAT CG4 LACTIC ACID, ED  I-STAT CG4 LACTIC ACID, ED  TYPE AND SCREEN    EKG  EKG Interpretation  Date/Time:  Wednesday June 03 2017 00:34:47 EDT Ventricular Rate:  115 PR Interval:    QRS Duration: 89 QT Interval:  327 QTC Calculation: 453 R Axis:   45 Text Interpretation:  Sinus tachycardia Confirmed by Thayer Jew [phone]) on 06/03/2017 12:42:23 AM       Radiology Dg Chest Port 1 View  Result Date: 06/03/2017 CLINICAL DATA:  Acute onset of fever and tachycardia. Initial encounter. EXAM: PORTABLE CHEST 1 VIEW COMPARISON:  Chest radiograph performed 05/17/2017 FINDINGS: The lungs are well-aerated. Mild vascular congestion is noted. There is no evidence of focal opacification, pleural effusion or pneumothorax. The cardiomediastinal silhouette is within normal limits. No acute osseous abnormalities are seen. IMPRESSION: Mild vascular congestion noted.  Lungs remain grossly clear. Electronically Signed   By: Garald Balding M.D.   On: 06/03/2017 00:51    Procedures Procedures (including critical care time)  Angiocath insertion Performed by: Merryl Hacker  Consent: Verbal consent obtained. Risks and benefits: risks, benefits and alternatives were discussed Time out: Immediately prior to procedure a "time out" was called to verify the correct patient, procedure, equipment, support staff and site/side marked as required.  Preparation: Patient was prepped and draped in the usual sterile  fashion.  Vein Location: left basillic  Ultrasound Guided  Gauge: 20  Normal blood return and flush without difficulty Patient tolerance: Patient tolerated the procedure well with no immediate complications.      Medications Ordered in ED Medications  vancomycin (VANCOCIN) IVPB 1000 mg/200 mL premix (not administered)  piperacillin-tazobactam (ZOSYN) IVPB 3.375 g (not administered)     Initial Impression / Assessment and Plan / ED Course  I have reviewed the triage vital signs and the nursing notes.  Pertinent labs & imaging results that were available during my care of the patient were reviewed by me and considered in my medical decision making (see chart for details).     Patient presents with fever. Just recently admitted for a similar presentation. He was covered for sepsis while in the hospital. Blood cultures negative and C. difficile negative. He subsequently was discharged. He was seen subsequently in the ED for an AV fistula bleeding. He returns today. Temperature here 100.0. He is refusing rectal temperature. He missed dialysis today. He is nontoxic but chronically ill-appearing. Reports abdominal pain and diarrhea. Lab workup is notable for a hemoglobin of 5.8. 4 days ago it was 8.9. Baseline around 7. Patient was typed and screened. Given recent history of fistula bleeding, this likely is acute blood loss related. He has a history of chronic anemia secondary to chronic kidney disease. He will need transfusion with dialysis. Infectious workup is so far negative with a negative chest x-ray. Urinalysis is pending. Patient was given broad-spectrum antibiotics. Lactate normal. Blood pressure remained stable. Given his dialysis, he was not given IV fluids as there was concern for volume overload. However, he was given broad-spectrum antibiotics to include vancomycin and Zosyn. Plan to admit to the hospital for further workup and blood transfusion with dialysis tomorrow.   Final  Clinical Impressions(s) / ED Diagnoses   Final diagnoses:  Fever, unspecified fever cause  Anemia in chronic kidney disease, unspecified CKD stage  Diarrhea, unspecified type    New Prescriptions New Prescriptions   No medications on file  Merryl Hacker, MD 06/03/17 [id number]    Merryl Hacker, MD 06/03/17 [phone]

## 2017-06-03 NOTE — Procedures (Signed)
    HEMODIALYSIS TREATMENT NOTE:  3.5 hour heparin-free dialysis completed via right upper arm AVF (16g/antegrade).  NO ultrafiltration, per MD order.  2 units PRBCs transfused with HD.  All blood was returned and hemostasis was achieved within 20 minutes. Report given to Caleen Jobs, RN.  [place], RN, CDN

## 2017-06-03 NOTE — Care Management (Signed)
CM consulted as patient is from SNF. CSW handles all SNF returns. CSW consulted. CM signing off.

## 2017-06-03 NOTE — ED Triage Notes (Signed)
Pt brought over from [facility] with fever per facility was 101.1 and heart rate of 120. They state they gave pt lovenox this am in left side of abd and tonight pt has hemotoma per facility to the left side.

## 2017-06-03 NOTE — ED Notes (Signed)
Pt refused rectal temp.

## 2017-06-03 NOTE — Consult Note (Addendum)
Zachary [name] MRN: [id number] DOB/AGE: September 05, 1946 71 y.o. Primary Care Physician:Fanta, Tesfaye, MD Admit date: 06/02/2017 Chief Complaint:  Chief Complaint  Patient presents with  . Fever   HPI: Pt is a 71 year old  African [place]  male with past  medical history significant of colectomy in over a month ago, mental retardation, DM, ESRD on dialysis who was sent  from SNF with c/o of  Fever.  HPI dates back to yesterday when  At [facility] pt had  Fever of 101.1 and heart rate of 120  . Pt offered no other additional symptoms . Pt history remains limited as patient is poor historian.  Pt only complaints of   pain in the upper abdomen. Pt is unable to qualify it any further. Pt also c/ o diarrhea. Pt missed his dialysis on Tuesday (yesterday) . Pt also about 4 days ago had a significant bleed from his fistula which was controlled and stopped in the ED and sent back to [facility] center.      Past Medical History:  Diagnosis Date  . Abnormal CT scan, kidney 10/06/2011  . Acute pyelonephritis 10/07/2011  . Anemia    normocytic  . Anxiety    mental retardation  . Bladder wall thickening 10/06/2011  . BPH (benign prostatic hypertrophy)   . Diabetes mellitus   . Dialysis patient [facility])    Tuesday, Thursday and Saturday,   . Edema     history of lower extremity edema  . GERD (gastroesophageal reflux disease)   . Heme positive stool   . Hydronephrosis   . Hyperkalemia   . Hyperlipidemia   . Hypernatremia   . Hypertension   . Hypothyroidism   . Impaired speech   . 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 MR (mental retardation)   . Muscle weakness   . Obstructive uropathy   . 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 Poor historian poor historian  . Protein calorie malnutrition ([place])   . Pyelonephritis   . Renal failure (ARF), acute on chronic (HCC)   . Renal insufficiency    chronic history  . Sepsis ([place])   . Smoking   . Uremia   . Urinary retention   . UTI (lower urinary  tract infection) 10/06/2011        Family History  Problem Relation Age of Onset  . Cancer Mother   . Colon cancer Neg Hx     Social History:  reports that he has never smoked. He has never used smokeless tobacco. He reports that he does not drink alcohol or use drugs.   Allergies: No Known Allergies  Medications Prior to Admission  Medication Sig Dispense Refill  . Amino Acids-Protein Hydrolys (FEEDING SUPPLEMENT, PRO-STAT 64,) LIQD Take 30 mLs by mouth 2 (two) times daily between meals.    Marland Kitchen atorvastatin (LIPITOR) 40 MG tablet Take 40 mg by mouth every evening.    . enoxaparin (LOVENOX) 100 MG/ML injection Inject 85 mg into the skin every 12 (twelve) hours.    . ferrous sulfate 325 (65 FE) MG tablet Take 325 mg by mouth daily with breakfast.    . levothyroxine (SYNTHROID, LEVOTHROID) 75 MCG tablet Take 75 mcg by mouth daily before breakfast.     . Multiple Vitamin (MULTIVITAMIN WITH MINERALS) TABS tablet Take 1 tablet by mouth every evening.     . Neo-Bacit-Poly-Lidocaine (FIRST AID PLUS LIDOCAINE EX) Apply 1 application topically Every Tuesday,Thursday,and Saturday with dialysis. Prior to dialysis    . Nutritional  Supplements (NEPRO/CARBSTEADY PO) Take 1 Can by mouth 2 (two) times daily.    Marland Kitchen omeprazole (PRILOSEC) 40 MG capsule Take 40 mg by mouth every evening.    . tamsulosin (FLOMAX) 0.4 MG CAPS capsule Take 0.4 mg by mouth daily at 8 pm. Give 30 minutes after a meal    . torsemide (DEMADEX) 10 MG tablet Take 5 tablets (50 mg total) by mouth daily. 30 tablet 3       IOX:BDZHG from the symptoms mentioned above,there are no other symptoms referable to all systems reviewed.  . Chlorhexidine Gluconate Cloth  6 each Topical Q0600  . insulin aspart  0-9 Units Subcutaneous TID WC  . mupirocin ointment  1 application Nasal BID  . sodium chloride flush  3 mL Intravenous Q12H  . torsemide  50 mg Oral Daily        Physical Exam: Vital signs in last 24 hours: Temp:  [99  F (37.2 C)-100.2 F (37.9 C)] 99 F (37.2 C) (08/08 0733) Pulse Rate:  [108-117] 108 (08/08 0800) Resp:  [10-19] 10 (08/08 0800) BP: (103-125)/(62-70) 111/66 (08/08 0800) SpO2:  [88 %-100 %] 88 % (08/08 0800) Weight:  [170 lb 6.7 oz (77.3 kg)-190 lb (86.2 kg)] 170 lb 6.7 oz (77.3 kg) (08/08 0425) Weight change:  Last BM Date: 06/03/17  Intake/Output from previous day: No intake/output data recorded. No intake/output data recorded.   Physical Exam: General- pt is awake,alert, follows commands. Resp- No acute REsp distress,  NO Rhonchi CVS- S1S2 regular in  Rhythm but tachy GIT- BS+, soft, Distended. Surgical scar well healed EXT- NO LE Edema, NO Cyanosis CNS- CN 2-12 grossly intact. Moving all 4 extremities Psych- normal mood and flat affect Access-  AVF   Lab Results: CBC  Recent Labs  06/03/17 0055 06/03/17 0446  WBC 6.6 6.0  HGB 5.8* 6.2*  HCT 18.4* 19.8*  PLT 280 277    BMET  Recent Labs  06/03/17 0055 06/03/17 0446  NA 131* 134*  K 3.3* 3.6  CL 96* 99*  CO2 21* 18*  GLUCOSE 117* 109*  BUN 54* 57*  CREATININE 7.33* 7.41*  CALCIUM 7.1* 7.4*    MICRO Recent Results (from the past 240 hour(s))  Blood Culture (routine x 2)     Status: None (Preliminary result)   Collection Time: 06/03/17 12:55 AM  Result Value Ref Range Status   Specimen Description BLOOD LEFT HAND INDEX FINGER  Final   Special Requests   Final    BOTTLES DRAWN AEROBIC AND ANAEROBIC Blood Culture results may not be optimal due to an inadequate volume of blood received in culture bottles   Culture NO GROWTH < 12 HOURS  Final   Report Status PENDING  Incomplete  Blood Culture (routine x 2)     Status: None (Preliminary result)   Collection Time: 06/03/17  1:14 AM  Result Value Ref Range Status   Specimen Description BLOOD LEFT HAND THUMB  Final   Special Requests   Final    BOTTLES DRAWN AEROBIC AND ANAEROBIC Blood Culture results may not be optimal due to an inadequate volume of  blood received in culture bottles   Culture NO GROWTH < 12 HOURS  Final   Report Status PENDING  Incomplete  MRSA PCR Screening     Status: Abnormal   Collection Time: 06/03/17  4:06 AM  Result Value Ref Range Status   MRSA by PCR POSITIVE (A) NEGATIVE Final    Comment:  The GeneXpert MRSA Assay (FDA approved for NASAL specimens only), is one component of a comprehensive MRSA colonization surveillance program. It is not intended to diagnose MRSA infection nor to guide or monitor treatment for MRSA infections. RESULT CALLED TO, READ BACK BY AND VERIFIED WITH: MAYS,J AT 0645 BY HUFFINES,S ON 06/03/17.       Lab Results  Component Value Date   PTH 61 10/09/2015   CALCIUM 7.4 (L) 06/03/2017   CAION 1.24 10/08/2015   PHOS 3.6 05/20/2017      Impression: )Renal  ESRD on HD                Pt is on TTS schedule as outpt                Pt was not  dialyzed yesterday.                will dialyze today  2)HTN  BP stable  3)Anemia IN ESRD the goal for HGB is 9-11. hgb not at goal Will keep on EPO during hd. Will transfuse PRBC   4)CKD Mineral-Bone Disorder Calcium when corrected for low albumin is at goal Phosphorus now  at goal.    5)CNS-Hx of mental Retardation  Primary MD following  6)Electrolytes  normokalemic  Hyponatremia  sec to ESRD   7)Acid base Co2  not  at goal. sec to ESRD  8) GI-pt is s/p total colectomy Clinically better   9) ID- admitted with fever On Broad spectrum abx  10) DVT-Hx of recent DVT Primary MD follwoing    Plan:  will dialyze toay Will use 3k bath Will transfuse during hd   Addendum Pt seen on HD.    BHUTANI,MANPREET S 06/03/2017, 10:40 AM

## 2017-06-03 NOTE — H&P (Signed)
History and Physical    Zachary [name]:[id number] DOB: 1945-11-09 DOA: 06/02/2017  PCP: [name], MD  Patient coming from: snf  Chief Complaint:  fever  HPI: Zachary [name] is a 71 y.o. male with medical history significant of colectomy in over a month ago, mental retardation, DM, ESRD on dialysis comes in from SNF for fever.  Pt is dialysis pt and missed his dialysis on Tuesday (yesterday) and is suppose to get dialysis today.  He also about 4 days ago had a significant bleed from his fistula which was controlled and stopped in the ED and sent back to [facility] center.  His hgb then was about 9 then.  He says he has had loose stools.  He really cannot provide much history.  He says his belly hurts.  Denies vomiting.  He does not know how long he has had fever.  No rashes.  Pt referred for admission for worsening anemia, and fever.   Review of Systems: As per HPI otherwise 10 point review of systems negative but unreliable probably  Past Medical History:  Diagnosis Date  . Abnormal CT scan, kidney 10/06/2011  . Acute pyelonephritis 10/07/2011  . Anemia    normocytic  . Anxiety    mental retardation  . Bladder wall thickening 10/06/2011  . BPH (benign prostatic hypertrophy)   . Diabetes mellitus   . Dialysis patient [facility])    Tuesday, Thursday and Saturday,   . Edema     history of lower extremity edema  . GERD (gastroesophageal reflux disease)   . Heme positive stool   . Hydronephrosis   . Hyperkalemia   . Hyperlipidemia   . Hypernatremia   . Hypertension   . Hypothyroidism   . Impaired speech   . 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 MR (mental retardation)   . Muscle weakness   . Obstructive uropathy   . 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 Poor historian poor historian  . Protein calorie malnutrition ([place])   . Pyelonephritis   . Renal failure (ARF), acute on chronic (HCC)   . Renal insufficiency    chronic history  . Sepsis ([place])   . Smoking   . Uremia   .  Urinary retention   . UTI (lower urinary tract infection) 10/06/2011    Past Surgical History:  Procedure Laterality Date  . AV FISTULA PLACEMENT Left 07/06/2015   Procedure:  INSERTION LEFT ARM ARTERIOVENOUS GORTEX GRAFT;  Surgeon: Angelia Mould, MD;  Location: [place];  Service: Vascular;  Laterality: Left;  . AV FISTULA PLACEMENT Right 02/26/2016   Procedure: ARTERIOVENOUS (AV) FISTULA CREATION ;  Surgeon: Angelia Mould, MD;  Location: [place];  Service: Vascular;  Laterality: Right;  . [place] REMOVAL Left 10/09/2015   Procedure: REMOVAL OF ARTERIOVENOUS GORETEX GRAFT ([place]) Evacuation of Lymphocele, Vein Patch angioplasty of brachial artery.;  Surgeon: Angelia Mould, MD;  Location: [place];  Service: Vascular;  Laterality: Left;  . BASCILIC VEIN TRANSPOSITION Right 02/26/2016   Procedure: Right BASCILIC VEIN TRANSPOSITION;  Surgeon: Angelia Mould, MD;  Location: [place];  Service: Vascular;  Laterality: Right;  . CIRCUMCISION N/A 01/04/2014   Procedure: CIRCUMCISION ADULT (procedure #1);  Surgeon: Marissa Nestle, MD;  Location: [facility];  Service: Urology;  Laterality: N/A;  . COLECTOMY N/A 05/04/2017   Procedure: TOTAL COLECTOMY;  Surgeon: Aviva Signs, MD;  Location: [facility];  Service: General;  Laterality: N/A;  . COLONOSCOPY N/A 04/27/2017   Procedure: COLONOSCOPY;  Surgeon: Daneil Dolin, MD;  [facility];  Service: Endoscopy;  Laterality: N/A;  245  . CYSTOSCOPY W/ RETROGRADES Bilateral 06/29/2015   Procedure: CYSTOSCOPY, DILATION OF URETHRAL STRICTURE WITH BILATERAL RETROGRADE PYELOGRAM,SUPRAPUBIC TUBE CHANGE;  Surgeon: Festus Aloe, MD;  Location: [facility];  Service: Urology;  Laterality: Bilateral;  . CYSTOSCOPY WITH URETHRAL DILATATION N/A 12/29/2013   Procedure: CYSTOSCOPY WITH URETHRAL DILATATION;  Surgeon: Marissa Nestle, MD;  Location: [facility];  Service: Urology;  Laterality: N/A;  . ESOPHAGOGASTRODUODENOSCOPY N/A 04/27/2017   Procedure:  ESOPHAGOGASTRODUODENOSCOPY (EGD);  Surgeon: Daneil Dolin, MD;  [facility];  Service: Endoscopy;  Laterality: N/A;  . ORIF FEMUR FRACTURE Right 11/22/2016   Procedure: OPEN REDUCTION INTERNAL FIXATION (ORIF) DISTAL FEMUR FRACTURE;  Surgeon: Rod Can, MD;  Location: [place];  Service: Orthopedics;  Laterality: Right;  . PERIPHERAL VASCULAR CATHETERIZATION N/A 10/08/2015   Procedure: A/V Shuntogram;  Surgeon: Angelia Mould, MD;  Location: [place] [facility];  Service: Cardiovascular;  Laterality: N/A;  . TRANSURETHRAL RESECTION OF PROSTATE N/A 01/04/2014   Procedure: TRANSURETHRAL RESECTION OF THE PROSTATE (TURP) (procedure #2);  Surgeon: Marissa Nestle, MD;  Location: [facility];  Service: Urology;  Laterality: N/A;     reports that he has never smoked. He has never used smokeless tobacco. He reports that he does not drink alcohol or use drugs.  No Known Allergies  Family History  Problem Relation Age of Onset  . Cancer Mother   . Colon cancer Neg Hx     Prior to Admission medications   Medication Sig Start Date End Date Taking? Authorizing Provider  Amino Acids-Protein Hydrolys (FEEDING SUPPLEMENT, PRO-STAT 64,) LIQD Take 30 mLs by mouth 2 (two) times daily between meals.    [provider]  atorvastatin (LIPITOR) 40 MG tablet Take 40 mg by mouth every evening.    [provider]  enoxaparin (LOVENOX) 100 MG/ML injection Inject 85 mg into the skin every 12 (twelve) hours.    [provider]  ferrous sulfate 325 (65 FE) MG tablet Take 325 mg by mouth daily with breakfast.    [provider]  levothyroxine (SYNTHROID, LEVOTHROID) 75 MCG tablet Take 75 mcg by mouth daily before breakfast.     [provider]  Multiple Vitamin (MULTIVITAMIN WITH MINERALS) TABS tablet Take 1 tablet by mouth every evening.     [provider]  Neo-Bacit-Poly-Lidocaine (FIRST AID PLUS LIDOCAINE EX) Apply 1 application topically Every  Tuesday,Thursday,and Saturday with dialysis. Prior to dialysis    [provider]  Nutritional Supplements (NEPRO/CARBSTEADY PO) Take 1 Can by mouth 2 (two) times daily.    [provider]  omeprazole (PRILOSEC) 40 MG capsule Take 40 mg by mouth every evening.    [provider]  tamsulosin (FLOMAX) 0.4 MG CAPS capsule Take 0.4 mg by mouth daily at 8 pm. Give 30 minutes after a meal    [provider]  torsemide (DEMADEX) 10 MG tablet Take 5 tablets (50 mg total) by mouth daily. 04/07/16   [name], MD    Physical Exam: Vitals:   06/03/17 0000 06/03/17 0045 06/03/17 0130 06/03/17 0322  BP: 111/62  107/63 109/64  Pulse: (!) 114 (!) 115 (!) 113 (!) 112  Resp:  15 13 19   Temp:      TempSrc:      SpO2: 98% 99% 100% 97%  Weight:      Height:        Constitutional:  NAD, calm, comfortable Vitals:   06/03/17 0000 06/03/17 0045 06/03/17 0130 06/03/17 0322  BP: 111/62  107/63 109/64  Pulse: (!) 114 (!) 115 (!) 113 (!) 112  Resp:  15 13 19   Temp:      TempSrc:      SpO2: 98% 99% 100% 97%  Weight:      Height:       Eyes: PERRL, lids and conjunctivae normal ENMT: Mucous membranes are moist. Posterior pharynx clear of any exudate or lesions.Normal dentition.  Neck: normal, supple, no masses, no thyromegaly Respiratory: clear to auscultation bilaterally, no wheezing, no crackles. Normal respiratory effort. No accessory muscle use.  Cardiovascular: Regular rate and rhythm, no murmurs / rubs / gallops. No extremity edema. 2+ pedal pulses. No carotid bruits.  Abdomen: no tenderness, no masses palpated. No hepatosplenomegaly. Bowel sounds positive.  Musculoskeletal: no clubbing / cyanosis. No joint deformity upper and lower extremities. Good ROM, no contractures. Normal muscle tone.  Skin: no rashes, lesions, ulcers. No induration Neurologic: CN 2-12 grossly intact. Sensation intact, DTR normal. Strength 5/5 in all 4.  Psychiatric: . Alert and oriented  x 3. Normal mood.    Labs on Admission: I have personally reviewed following labs and imaging studies  CBC:  Recent Labs Lab 05/30/17 2323 06/03/17 0055  WBC 8.4 6.6  NEUTROABS 5.3 4.0  HGB 8.9* 5.8*  HCT 28.6* 18.4*  MCV 99.0 95.8  PLT 376 409   Basic Metabolic Panel:  Recent Labs Lab 05/30/17 2323 06/03/17 0055  NA 139 131*  K 3.3* 3.3*  CL 102 96*  CO2 26 21*  GLUCOSE 145* 117*  BUN 20 54*  CREATININE 4.86* 7.33*  CALCIUM 8.6* 7.1*   GFR: Estimated Creatinine Clearance: 10.6 mL/min (A) (by C-G formula based on SCr of 7.33 mg/dL (H)). Liver Function Tests:  Recent Labs Lab 06/03/17 0055  AST 16  ALT 15*  ALKPHOS 87  BILITOT 0.5  PROT 6.5  ALBUMIN 2.4*    Urine analysis:    Component Value Date/Time   COLORURINE YELLOW 06/03/2017 0019   APPEARANCEUR HAZY (A) 06/03/2017 0019   LABSPEC 1.017 06/03/2017 0019   PHURINE 5.0 06/03/2017 0019   GLUCOSEU NEGATIVE 06/03/2017 0019   HGBUR NEGATIVE 06/03/2017 0019   BILIRUBINUR SMALL (A) 06/03/2017 0019   KETONESUR NEGATIVE 06/03/2017 0019   PROTEINUR NEGATIVE 06/03/2017 0019   UROBILINOGEN 0.2 07/20/2015 1850   NITRITE NEGATIVE 06/03/2017 0019   LEUKOCYTESUR NEGATIVE 06/03/2017 0019    Recent Results (from the past 240 hour(s))  Blood Culture (routine x 2)     Status: None (Preliminary result)   Collection Time: 06/03/17 12:55 AM  Result Value Ref Range Status   Specimen Description BLOOD LEFT HAND INDEX FINGER  Final   Special Requests   Final    BOTTLES DRAWN AEROBIC AND ANAEROBIC Blood Culture results may not be optimal due to an inadequate volume of blood received in culture bottles   Culture PENDING  Incomplete   Report Status PENDING  Incomplete  Blood Culture (routine x 2)     Status: None (Preliminary result)   Collection Time: 06/03/17  1:14 AM  Result Value Ref Range Status   Specimen Description BLOOD LEFT HAND THUMB  Final   Special Requests   Final    BOTTLES DRAWN AEROBIC AND  ANAEROBIC Blood Culture results may not be optimal due to an inadequate volume of blood received in culture bottles   Culture PENDING  Incomplete   Report Status PENDING  Incomplete     Radiological Exams on Admission: Dg Chest Port 1 View  Result Date: 06/03/2017 CLINICAL DATA:  Acute onset of fever and tachycardia. Initial encounter. EXAM: PORTABLE CHEST 1 VIEW COMPARISON:  Chest radiograph performed 05/17/2017 FINDINGS: The lungs are well-aerated. Mild vascular congestion is noted. There is no evidence of focal opacification, pleural effusion or pneumothorax. The cardiomediastinal silhouette is within normal limits. No acute osseous abnormalities are seen. IMPRESSION: Mild vascular congestion noted.  Lungs remain grossly clear. Electronically Signed   By: Garald Balding M.D.   On: 06/03/2017 00:51     Assessment/Plan 71 yo male with fever h/o ESRD dialysis pt with worsening anemia Principal Problem:   Fever- cxr neg.  Iv vanc and zosyn until cx data back.  No rashes.  abd exam is benign and soft.  Source unclear. Lactic normal. Active Problems:   Mental retardation- noted   Acute blood loss anemia from recent fistula bleed - will need blood with dialysis in the am   ESRD (end stage renal disease) [facility])- nephrology consulted for session later today   Anemia in CKD (chronic kidney disease)- noted   Med rec is not complete, It looks like he may be on full dose of lovenox for recent dvt, am holding this for now  DVT prophylaxis:   scds Code Status:  full Family Communication:  none  Disposition Plan:  Per day team Consults called:  nephrology Admission status:  admission   Esmee Fallaw A MD [facility]  If 7PM-7AM, please contact night-coverage [email or web address] Password [email or web address]  06/03/2017, 4:21 AM

## 2017-06-03 NOTE — Progress Notes (Signed)
Pharmacy Antibiotic Note  Zachary [name] is a 71 y.o. male admitted on 06/02/2017 with fever.  Pharmacy has been consulted for Vancomycin and Zosyn dosing.  Plan: Vancomycin 750mg  IV with each dialysis session Zosyn 3.375gm IV q12hrs (dialysis dosing) Monitor labs, progress, c/s  Height: 6' (182.9 cm) Weight: 170 lb 6.7 oz (77.3 kg) IBW/kg (Calculated) : 77.6  Temp (24hrs), Avg:99.8 F (37.7 C), Min:99 F (37.2 C), Max:100.2 F (37.9 C)   Recent Labs Lab 05/30/17 2323 05/30/17 2341 06/03/17 0055 06/03/17 0110 06/03/17 0446  WBC 8.4  --  6.6  --  6.0  CREATININE 4.86*  --  7.33*  --  7.41*  LATICACIDVEN  --  2.59*  --  1.19  --     Estimated Creatinine Clearance: 10.4 mL/min (A) (by C-G formula based on SCr of 7.41 mg/dL (H)).    No Known Allergies  Antimicrobials this admission: Vancomycin 8/8 >>  Zosyn 8/8 >>   Dose adjustments this admission:  Microbiology results:  BCx: pending  UCx: pending   Sputum:    MRSA PCR:   Thank you for allowing pharmacy to be a part of this patient's care.  Hart Robinsons A 06/03/2017 12:24 PM

## 2017-06-04 LAB — TYPE AND SCREEN
ABO/RH(D): B POS
Antibody Screen: NEGATIVE
Unit division: 0
Unit division: 0

## 2017-06-04 LAB — GLUCOSE, CAPILLARY
Glucose-Capillary: 90 mg/dL (ref 65–99)
Glucose-Capillary: 98 mg/dL (ref 65–99)
Glucose-Capillary: 105 mg/dL — ABNORMAL HIGH (ref 65–99)
Glucose-Capillary: 123 mg/dL — ABNORMAL HIGH (ref 65–99)

## 2017-06-04 LAB — BPAM RBC
Blood Product Expiration Date: 201808292359
Blood Product Expiration Date: 201808302359
ISSUE DATE / TIME: 201808081732
ISSUE DATE / TIME: 201808081817
Unit Type and Rh: 5100
Unit Type and Rh: 5100

## 2017-06-04 LAB — HEPATITIS B SURFACE ANTIGEN: Hepatitis B Surface Ag: NEGATIVE

## 2017-06-04 MED ORDER — FERROUS SULFATE 325 (65 FE) MG PO TABS
325.0000 mg | ORAL_TABLET | Freq: Every day | ORAL | Status: DC
  Administered 2017-06-04 – 2017-06-05 (×2): 325 mg via ORAL
  Filled 2017-06-04 (×2): qty 1

## 2017-06-04 MED ORDER — ADULT MULTIVITAMIN W/MINERALS CH
1.0000 | ORAL_TABLET | Freq: Every evening | ORAL | Status: DC
  Administered 2017-06-04 – 2017-06-09 (×6): 1 via ORAL
  Filled 2017-06-04 (×6): qty 1

## 2017-06-04 MED ORDER — ATORVASTATIN CALCIUM 40 MG PO TABS
40.0000 mg | ORAL_TABLET | Freq: Every evening | ORAL | Status: DC
  Administered 2017-06-04 – 2017-06-09 (×6): 40 mg via ORAL
  Filled 2017-06-04 (×6): qty 1

## 2017-06-04 MED ORDER — PANTOPRAZOLE SODIUM 40 MG PO TBEC
40.0000 mg | DELAYED_RELEASE_TABLET | Freq: Every day | ORAL | Status: DC
  Administered 2017-06-04 – 2017-06-10 (×6): 40 mg via ORAL
  Filled 2017-06-04 (×6): qty 1

## 2017-06-04 MED ORDER — ENOXAPARIN SODIUM 100 MG/ML ~~LOC~~ SOLN: 85.0000 mg | Freq: Two times a day (BID) | SUBCUTANEOUS | Status: DC

## 2017-06-04 MED ORDER — TAMSULOSIN HCL 0.4 MG PO CAPS
0.4000 mg | ORAL_CAPSULE | Freq: Every day | ORAL | Status: DC
  Administered 2017-06-04 – 2017-06-09 (×5): 0.4 mg via ORAL
  Filled 2017-06-04 (×5): qty 1

## 2017-06-04 MED ORDER — LEVOTHYROXINE SODIUM 75 MCG PO TABS
75.0000 ug | ORAL_TABLET | Freq: Every day | ORAL | Status: DC
  Administered 2017-06-04 – 2017-06-10 (×7): 75 ug via ORAL
  Filled 2017-06-04 (×7): qty 1

## 2017-06-04 NOTE — Progress Notes (Signed)
Medication: Lovenox  A/P:  71 y.o. male with medical history of ESRD . The patient had a right femoral fracture status post ORIF on 11/22/2016. Shortly after procedure,  Pt was diagnosed with right distal femoral DVT which was detected on 12/24/2016. The patient was started on warfarin at that time. Patient has had over 5  months of anticoagulation for a provoked lower extremity DVT. Discussed with Dr. Legrand Rams and will discontinue lovenox.  Isac Sarna, BS [place], BCPS Clinical Pharmacist Pager [phone]

## 2017-06-04 NOTE — Progress Notes (Signed)
Report called to [place] R.N. Pt. Transferred to room 331 vis wheelchair.

## 2017-06-04 NOTE — Progress Notes (Signed)
Subjective: Interval History: has no complaint of difficulty breathing. Patient also denies any nausea or vomiting..  Objective: Vital signs in last 24 hours: Temp:  [98.9 F (37.2 C)-100.2 F (37.9 C)] 100.2 F (37.9 C) (08/09 0744) Pulse Rate:  [110-122] 120 (08/09 0744) Resp:  [12-27] 27 (08/09 0744) BP: (87-129)/(53-79) 102/65 (08/09 0000) SpO2:  [83 %-100 %] 100 % (08/09 0744) Weight:  [77.3 kg (170 lb 6.7 oz)-78 kg (171 lb 15.3 oz)] 78 kg (171 lb 15.3 oz) (08/09 0500) Weight change: -8.883 kg (-19 lb 9.4 oz)  Intake/Output from previous day: 08/08 0701 - 08/09 0700 In: 628 [Blood:628] Out: -827  Intake/Output this shift: No intake/output data recorded.  Generally patient is alert and in no apparent distress Chest is clear to auscultation Heart exam revealed regular rate and rhythm no murmur Extremities no edema  Lab Results:  Recent Labs  06/03/17 0446 06/03/17 2013  WBC 6.0 6.3  HGB 6.2* 9.4*  HCT 19.8* 28.7*  PLT 277 237   BMET:  Recent Labs  06/03/17 0055 06/03/17 0446  NA 131* 134*  K 3.3* 3.6  CL 96* 99*  CO2 21* 18*  GLUCOSE 117* 109*  BUN 54* 57*  CREATININE 7.33* 7.41*  CALCIUM 7.1* 7.4*   No results for input(s): PTH in the last 72 hours. Iron Studies: No results for input(s): IRON, TIBC, TRANSFERRIN, FERRITIN in the last 72 hours.  Studies/Results: Dg Chest Port 1 View  Result Date: 06/03/2017 CLINICAL DATA:  Acute onset of fever and tachycardia. Initial encounter. EXAM: PORTABLE CHEST 1 VIEW COMPARISON:  Chest radiograph performed 05/17/2017 FINDINGS: The lungs are well-aerated. Mild vascular congestion is noted. There is no evidence of focal opacification, pleural effusion or pneumothorax. The cardiomediastinal silhouette is within normal limits. No acute osseous abnormalities are seen. IMPRESSION: Mild vascular congestion noted.  Lungs remain grossly clear. Electronically Signed   By: Garald Balding M.D.   On: 06/03/2017 00:51    I have  reviewed the patient's current medications.  Assessment/Plan: Problem #1 history of fever : Etiology presently is no clear. Patient presently afebrile and his white blood cell count is normal. Patient on antibiotics. His blood culture is pending Problem #2 anemia: Status post blood transfusion. His hemoglobin has improved. Problem #3 end-stage renal disease: He is status post hemodialysis yesterday. His potassium is normal Problem #4 history of mental retardation Problem #5 bone mineral disorder: His calcium is range  Problem #6 history of colectomy Problem #7 history of diabetes. Plan: 1]We'll make admit for patient to get dialysis tomorrow 2] will continue with Epogen after dialysis 3] will check CBC and renal panel in the morning.   LOS: 1 day   Ivry Pigue S 06/04/2017,9:02 AM

## 2017-06-04 NOTE — Progress Notes (Signed)
Subjective: Patient was readmitted from [facility] center due to fever  And severe anemia. He is started on empiric antibiotics and was transfused 2 units of PRBC. He had hemodialysis yesterday. His fever is subsiding. Culture results are pending.  Objective: Vital signs in last 24 hours: Temp:  [98.9 F (37.2 C)-100.2 F (37.9 C)] 100.2 F (37.9 C) (08/09 0744) Pulse Rate:  [110-122] 120 (08/09 0744) Resp:  [12-27] 27 (08/09 0744) BP: (87-129)/(53-79) 102/65 (08/09 0000) SpO2:  [83 %-100 %] 100 % (08/09 0744) Weight:  [77.3 kg (170 lb 6.7 oz)-78 kg (171 lb 15.3 oz)] 78 kg (171 lb 15.3 oz) (08/09 0500) Weight change: -8.883 kg (-19 lb 9.4 oz) Last BM Date: 06/03/17  Intake/Output from previous day: 08/08 0701 - 08/09 0700 In: 628 [Blood:628] Out: -827   PHYSICAL EXAM General appearance: no distress and slowed mentation Resp: clear to auscultation bilaterally Cardio: S1, S2 normal GI: soft, non-tender; bowel sounds normal; no masses,  no organomegaly Extremities: extremities normal, atraumatic, no cyanosis or edema  Lab Results:  Results for orders placed or performed during the hospital encounter of 06/02/17 (from the past 48 hour(s))  Urinalysis, Routine w reflex microscopic     Status: Abnormal   Collection Time: 06/03/17 12:19 AM  Result Value Ref Range   Color, Urine YELLOW YELLOW   APPearance HAZY (A) CLEAR   Specific Gravity, Urine 1.017 1.005 - 1.030   pH 5.0 5.0 - 8.0   Glucose, UA NEGATIVE NEGATIVE mg/dL   Hgb urine dipstick NEGATIVE NEGATIVE   Bilirubin Urine SMALL (A) NEGATIVE   Ketones, ur NEGATIVE NEGATIVE mg/dL   Protein, ur NEGATIVE NEGATIVE mg/dL   Nitrite NEGATIVE NEGATIVE   Leukocytes, UA NEGATIVE NEGATIVE  Comprehensive metabolic panel     Status: Abnormal   Collection Time: 06/03/17 12:55 AM  Result Value Ref Range   Sodium 131 (L) 135 - 145 mmol/L   Potassium 3.3 (L) 3.5 - 5.1 mmol/L   Chloride 96 (L) 101 - 111 mmol/L   CO2 21 (L) 22 - 32 mmol/L    Glucose, Bld 117 (H) 65 - 99 mg/dL   BUN 54 (H) 6 - 20 mg/dL   Creatinine, Ser 7.33 (H) 0.61 - 1.24 mg/dL   Calcium 7.1 (L) 8.9 - 10.3 mg/dL   Total Protein 6.5 6.5 - 8.1 g/dL   Albumin 2.4 (L) 3.5 - 5.0 g/dL   AST 16 15 - 41 U/L   ALT 15 (L) 17 - 63 U/L   Alkaline Phosphatase 87 38 - 126 U/L   Total Bilirubin 0.5 0.3 - 1.2 mg/dL   GFR calc non Af Amer 7 (L) >60 mL/min   GFR calc Af Amer 8 (L) >60 mL/min    Comment: (NOTE) The eGFR has been calculated using the CKD EPI equation. This calculation has not been validated in all clinical situations. eGFR's persistently <60 mL/min signify possible Chronic Kidney Disease.    Anion gap 14 5 - 15  CBC WITH DIFFERENTIAL     Status: Abnormal   Collection Time: 06/03/17 12:55 AM  Result Value Ref Range   WBC 6.6 4.0 - 10.5 K/uL   RBC 1.92 (L) 4.22 - 5.81 MIL/uL   Hemoglobin 5.8 (LL) 13.0 - 17.0 g/dL    Comment: RESULT REPEATED AND VERIFIED CRITICAL RESULT CALLED TO, READ BACK BY AND VERIFIED WITH:  HAYMORE,R @ 0144 ON 06/03/17 BY JUW    HCT 18.4 (L) 39.0 - 52.0 %   MCV 95.8 78.0 - 100.0  fL   MCH 30.2 26.0 - 34.0 pg   MCHC 31.5 30.0 - 36.0 g/dL   RDW 19.7 (H) 11.5 - 15.5 %   Platelets 280 150 - 400 K/uL   Neutrophils Relative % 61 %   Neutro Abs 4.0 1.7 - 7.7 K/uL   Lymphocytes Relative 22 %   Lymphs Abs 1.4 0.7 - 4.0 K/uL   Monocytes Relative 17 %   Monocytes Absolute 1.2 (H) 0.1 - 1.0 K/uL   Eosinophils Relative 1 %   Eosinophils Absolute 0.0 0.0 - 0.7 K/uL   Basophils Relative 0 %   Basophils Absolute 0.0 0.0 - 0.1 K/uL  Blood Culture (routine x 2)     Status: None (Preliminary result)   Collection Time: 06/03/17 12:55 AM  Result Value Ref Range   Specimen Description BLOOD LEFT HAND INDEX FINGER    Special Requests      BOTTLES DRAWN AEROBIC AND ANAEROBIC Blood Culture results may not be optimal due to an inadequate volume of blood received in culture bottles   Culture NO GROWTH 1 DAY    Report Status PENDING   I-Stat CG4  Lactic Acid, ED  (not at  [facility])     Status: None   Collection Time: 06/03/17  1:10 AM  Result Value Ref Range   Lactic Acid, Venous 1.19 0.5 - 1.9 mmol/L  Blood Culture (routine x 2)     Status: None (Preliminary result)   Collection Time: 06/03/17  1:14 AM  Result Value Ref Range   Specimen Description BLOOD LEFT HAND THUMB    Special Requests      BOTTLES DRAWN AEROBIC AND ANAEROBIC Blood Culture results may not be optimal due to an inadequate volume of blood received in culture bottles   Culture NO GROWTH 1 DAY    Report Status PENDING   Type and screen [facility]     Status: None (Preliminary result)   Collection Time: 06/03/17  2:41 AM  Result Value Ref Range   ABO/RH(D) B POS    Antibody Screen NEG    Sample Expiration 06/06/2017    Unit Number [id number]    Blood Component Type RED CELLS,LR    Unit division 00    Status of Unit ISSUED    Transfusion Status OK TO TRANSFUSE    Crossmatch Result Compatible    Unit Number [id number]    Blood Component Type RBC LR PHER1    Unit division 00    Status of Unit ISSUED    Transfusion Status OK TO TRANSFUSE    Crossmatch Result Compatible   MRSA PCR Screening     Status: Abnormal   Collection Time: 06/03/17  4:06 AM  Result Value Ref Range   MRSA by PCR POSITIVE (A) NEGATIVE    Comment:        The GeneXpert MRSA Assay (FDA approved for NASAL specimens only), is one component of a comprehensive MRSA colonization surveillance program. It is not intended to diagnose MRSA infection nor to guide or monitor treatment for MRSA infections. RESULT CALLED TO, READ BACK BY AND VERIFIED WITH: MAYS,J AT 0645 BY HUFFINES,S ON 06/03/17.   CBC     Status: Abnormal   Collection Time: 06/03/17  4:46 AM  Result Value Ref Range   WBC 6.0 4.0 - 10.5 K/uL   RBC 2.04 (L) 4.22 - 5.81 MIL/uL   Hemoglobin 6.2 (LL) 13.0 - 17.0 g/dL    Comment: CRITICAL VALUE NOTED.  VALUE IS CONSISTENT WITH  PREVIOUSLY REPORTED AND CALLED  VALUE. DELTA CHECK NOTED RESULT REPEATED AND VERIFIED    HCT 19.8 (L) 39.0 - 52.0 %   MCV 97.1 78.0 - 100.0 fL   MCH 30.4 26.0 - 34.0 pg   MCHC 31.3 30.0 - 36.0 g/dL   RDW 19.8 (H) 11.5 - 15.5 %   Platelets 277 150 - 400 K/uL  Basic metabolic panel     Status: Abnormal   Collection Time: 06/03/17  4:46 AM  Result Value Ref Range   Sodium 134 (L) 135 - 145 mmol/L   Potassium 3.6 3.5 - 5.1 mmol/L   Chloride 99 (L) 101 - 111 mmol/L   CO2 18 (L) 22 - 32 mmol/L   Glucose, Bld 109 (H) 65 - 99 mg/dL   BUN 57 (H) 6 - 20 mg/dL   Creatinine, Ser 7.41 (H) 0.61 - 1.24 mg/dL   Calcium 7.4 (L) 8.9 - 10.3 mg/dL   GFR calc non Af Amer 7 (L) >60 mL/min   GFR calc Af Amer 8 (L) >60 mL/min    Comment: (NOTE) The eGFR has been calculated using the CKD EPI equation. This calculation has not been validated in all clinical situations. eGFR's persistently <60 mL/min signify possible Chronic Kidney Disease.    Anion gap 17 (H) 5 - 15  Prepare RBC     Status: None   Collection Time: 06/03/17 10:54 AM  Result Value Ref Range   Order Confirmation ORDER PROCESSED BY BLOOD BANK   Hepatitis B surface antigen     Status: None   Collection Time: 06/03/17  5:30 PM  Result Value Ref Range   Hepatitis B Surface Ag Negative Negative    Comment: (NOTE) Performed At: [facility] [place], [place] [id number] Lindon Romp MD XJ:[id number]   CBC     Status: Abnormal   Collection Time: 06/03/17  8:13 PM  Result Value Ref Range   WBC 6.3 4.0 - 10.5 K/uL   RBC 3.14 (L) 4.22 - 5.81 MIL/uL   Hemoglobin 9.4 (L) 13.0 - 17.0 g/dL    Comment: DELTA CHECK NOTED   HCT 28.7 (L) 39.0 - 52.0 %   MCV 91.4 78.0 - 100.0 fL   MCH 29.9 26.0 - 34.0 pg   MCHC 32.8 30.0 - 36.0 g/dL   RDW 17.3 (H) 11.5 - 15.5 %   Platelets 237 150 - 400 K/uL  Glucose, capillary     Status: None   Collection Time: 06/04/17 12:00 AM  Result Value Ref Range   Glucose-Capillary 79 65 - 99 mg/dL  Glucose, capillary      Status: None   Collection Time: 06/04/17  7:42 AM  Result Value Ref Range   Glucose-Capillary 90 65 - 99 mg/dL    ABGS No results for input(s): PHART, PO2ART, TCO2, HCO3 in the last 72 hours.  Invalid input(s): PCO2 CULTURES Recent Results (from the past 240 hour(s))  Blood Culture (routine x 2)     Status: None (Preliminary result)   Collection Time: 06/03/17 12:55 AM  Result Value Ref Range Status   Specimen Description BLOOD LEFT HAND INDEX FINGER  Final   Special Requests   Final    BOTTLES DRAWN AEROBIC AND ANAEROBIC Blood Culture results may not be optimal due to an inadequate volume of blood received in culture bottles   Culture NO GROWTH 1 DAY  Final   Report Status PENDING  Incomplete  Blood Culture (routine x 2)  Status: None (Preliminary result)   Collection Time: 06/03/17  1:14 AM  Result Value Ref Range Status   Specimen Description BLOOD LEFT HAND THUMB  Final   Special Requests   Final    BOTTLES DRAWN AEROBIC AND ANAEROBIC Blood Culture results may not be optimal due to an inadequate volume of blood received in culture bottles   Culture NO GROWTH 1 DAY  Final   Report Status PENDING  Incomplete  MRSA PCR Screening     Status: Abnormal   Collection Time: 06/03/17  4:06 AM  Result Value Ref Range Status   MRSA by PCR POSITIVE (A) NEGATIVE Final    Comment:        The GeneXpert MRSA Assay (FDA approved for NASAL specimens only), is one component of a comprehensive MRSA colonization surveillance program. It is not intended to diagnose MRSA infection nor to guide or monitor treatment for MRSA infections. RESULT CALLED TO, READ BACK BY AND VERIFIED WITH: MAYS,J AT 0645 BY HUFFINES,S ON 06/03/17.    Studies/Results: Dg Chest Port 1 View  Result Date: 06/03/2017 CLINICAL DATA:  Acute onset of fever and tachycardia. Initial encounter. EXAM: PORTABLE CHEST 1 VIEW COMPARISON:  Chest radiograph performed 05/17/2017 FINDINGS: The lungs are well-aerated. Mild  vascular congestion is noted. There is no evidence of focal opacification, pleural effusion or pneumothorax. The cardiomediastinal silhouette is within normal limits. No acute osseous abnormalities are seen. IMPRESSION: Mild vascular congestion noted.  Lungs remain grossly clear. Electronically Signed   By: Garald Balding M.D.   On: 06/03/2017 00:51    Medications: I have reviewed the patient's current medications.  Assesment:   Principal Problem:   Fever Active Problems:   Mental retardation   Acute blood loss anemia   ESRD (end stage renal disease) ([place])   Anemia in CKD (chronic kidney disease) DVT   Plan:  Medications reviewed Will continue Iv antibiotics Will follow culture result Home medications reordered Hemodialysis as per nephrology    LOS: 1 day   Lenix Benoist 06/04/2017, 8:13 AM

## 2017-06-04 NOTE — Progress Notes (Addendum)
Pt with poor peripheral vascular access. Attempted x3 to place midline on left. PT has a fistula on right. Assessed for PIV site for the interim until an IJ can be placed. No attempt was made as there is no vein that was accessible.  Nurse made aware.

## 2017-06-05 LAB — CBC
HCT: 24 % — ABNORMAL LOW (ref 39.0–52.0)
Hemoglobin: 7.9 g/dL — ABNORMAL LOW (ref 13.0–17.0)
MCH: 30.2 pg (ref 26.0–34.0)
MCHC: 32.9 g/dL (ref 30.0–36.0)
MCV: 91.6 fL (ref 78.0–100.0)
Platelets: 249 10*3/uL (ref 150–400)
RBC: 2.62 MIL/uL — ABNORMAL LOW (ref 4.22–5.81)
RDW: 17.8 % — ABNORMAL HIGH (ref 11.5–15.5)
WBC: 6.6 10*3/uL (ref 4.0–10.5)

## 2017-06-05 LAB — GLUCOSE, CAPILLARY
Glucose-Capillary: 96 mg/dL (ref 65–99)
Glucose-Capillary: 91 mg/dL (ref 65–99)
Glucose-Capillary: 102 mg/dL — ABNORMAL HIGH (ref 65–99)
Glucose-Capillary: 101 mg/dL — ABNORMAL HIGH (ref 65–99)
Glucose-Capillary: 114 mg/dL — ABNORMAL HIGH (ref 65–99)

## 2017-06-05 LAB — RENAL FUNCTION PANEL
Albumin: 2 g/dL — ABNORMAL LOW (ref 3.5–5.0)
Anion gap: 12 (ref 5–15)
BUN: 46 mg/dL — ABNORMAL HIGH (ref 6–20)
CO2: 25 mmol/L (ref 22–32)
Calcium: 7.4 mg/dL — ABNORMAL LOW (ref 8.9–10.3)
Chloride: 97 mmol/L — ABNORMAL LOW (ref 101–111)
Creatinine, Ser: 5.13 mg/dL — ABNORMAL HIGH (ref 0.61–1.24)
GFR calc Af Amer: 12 mL/min — ABNORMAL LOW (ref >60)
GFR calc non Af Amer: 10 mL/min — ABNORMAL LOW (ref >60)
Glucose, Bld: 95 mg/dL (ref 65–99)
Phosphorus: 6.7 mg/dL — ABNORMAL HIGH (ref 2.5–4.6)
Potassium: 3.3 mmol/L — ABNORMAL LOW (ref 3.5–5.1)
Sodium: 134 mmol/L — ABNORMAL LOW (ref 135–145)

## 2017-06-05 MED ORDER — LOPERAMIDE HCL 2 MG PO CAPS
2.0000 mg | ORAL_CAPSULE | ORAL | Status: DC | PRN
  Filled 2017-06-05: qty 1

## 2017-06-05 MED ORDER — SODIUM CHLORIDE 0.9 % IV SOLN: 100.0000 mL | INTRAVENOUS | Status: DC | PRN

## 2017-06-05 MED ORDER — HEPARIN SODIUM (PORCINE) 1000 UNIT/ML DIALYSIS
1000.0000 [IU] | INTRAMUSCULAR | Status: DC | PRN
  Filled 2017-06-05: qty 1

## 2017-06-05 MED ORDER — LOPERAMIDE HCL 2 MG PO CAPS
4.0000 mg | ORAL_CAPSULE | Freq: Once | ORAL | Status: AC
  Administered 2017-06-05: 4 mg via ORAL
  Filled 2017-06-05: qty 2

## 2017-06-05 MED ORDER — ALTEPLASE 2 MG IJ SOLR
2.0000 mg | Freq: Once | INTRAMUSCULAR | Status: DC | PRN
  Filled 2017-06-05: qty 2

## 2017-06-05 MED ORDER — EPOETIN ALFA 10000 UNIT/ML IJ SOLN
INTRAMUSCULAR | Status: AC
  Filled 2017-06-05: qty 1

## 2017-06-05 NOTE — Progress Notes (Signed)
Subjective: Interval History: He has still some abdominal pain. He denies any nausea or vomiting. Patient also denies any difficulty in breathing.  Objective: Vital signs in last 24 hours: Temp:  [98.8 F (37.1 C)-100.6 F (38.1 C)] 98.8 F (37.1 C) (08/10 0515) Pulse Rate:  [104-117] 104 (08/10 0515) Resp:  [15-22] 15 (08/10 0515) BP: (105-110)/(58-69) 110/69 (08/10 0515) SpO2:  [97 %-100 %] 100 % (08/10 0515) Weight change:   Intake/Output from previous day: 08/09 0701 - 08/10 0700 In: 663 [P.O.:360; I.V.:3; IV Piggyback:300] Out: 1 [Stool:1] Intake/Output this shift: No intake/output data recorded.  Generally patient is alert and in no apparent distress Chest is clear to auscultation Heart exam revealed regular rate and rhythm no murmur Extremities no edema  Lab Results:  Recent Labs  06/03/17 0446 06/03/17 2013  WBC 6.0 6.3  HGB 6.2* 9.4*  HCT 19.8* 28.7*  PLT 277 237   BMET:   Recent Labs  06/03/17 0055 06/03/17 0446  NA 131* 134*  K 3.3* 3.6  CL 96* 99*  CO2 21* 18*  GLUCOSE 117* 109*  BUN 54* 57*  CREATININE 7.33* 7.41*  CALCIUM 7.1* 7.4*   No results for input(s): PTH in the last 72 hours. Iron Studies: No results for input(s): IRON, TIBC, TRANSFERRIN, FERRITIN in the last 72 hours.  Studies/Results: No results found.  I have reviewed the patient's current medications.  Assessment/Plan: Problem #1 history of fever : Etiology presently is no clear. Patient Remains febrile. Blood cultures pending until now no gross. Problem #2 anemia: Status post blood transfusion. His hemoglobin has improved. Problem #3 end-stage renal disease: He is status post hemodialysis on Wednesday. Presently is on dialysis. Patient presently does not have any uremic signs and symptoms. Problem #4 history of mental retardation Problem #5 bone mineral disorder: His calcium is range  Problem #6 history of colectomy : Patient states that his abdominal pain is getting  better. Presently abdomen is nontender and positive bowel sounds. Problem #7 history of diabetes. Plan: 1]We'll continue his dialysis and remove about 2 L if blood pressure tolerates. 2] will continue with Epogen after dialysis    LOS: 2 days   [place] Krotz S 06/05/2017,8:11 AM

## 2017-06-05 NOTE — Clinical Social Work Note (Signed)
Clinical Social Work Assessment  Patient Details  Name: Zachary [name] MRN: [id number] Date of Birth: Jun 11, 1946  Date of referral:  06/05/17               Reason for consult:  Other (Comment Required) (return to [facility])                Permission sought to share information with:    Permission granted to share information::     Name::        Agency::     Relationship::     Contact Information:     Housing/Transportation Living arrangements for the past 2 months:  [place] of Information:  Patient Patient Interpreter Needed:  None Criminal Activity/Legal Involvement Pertinent to Current Situation/Hospitalization:  No - Comment as needed Significant Relationships:  Siblings Lives with:  Facility Resident Do you feel safe going back to the place where you live?  Yes Need for family participation in patient care:  Yes (Comment)  Care giving concerns:  None identified. Facility resident.    Social Worker assessment / plan:  Patient from [facility] where he has been a resident since Jan. 2018.  Patient had previously been a resident at [facility].  He is at [facility] for rehab, however he continues to have acute medical issues that create readmission. Patient to return to [facility] at discharge. LCSW left a message for guardian, Zachary [name] regarding patient's status.  Patient does not need a new FL2 at discharge.   Employment status:  Disabled (Comment on whether or not currently receiving Disability) Insurance information:  Medicare, Medicaid [place] PT Recommendations:  Not assessed at this time Information / Referral to community resources:     Patient/Family's Response to care:  Patient is agreeable to return to [facility].   Patient/Family's Understanding of and Emotional Response to Diagnosis, Current Treatment, and Prognosis:  Message left for legal guardian.   Emotional Assessment Appearance:  Appears stated age Attitude/Demeanor/Rapport:    Affect (typically observed):   Accepting Orientation:  Oriented to Self, Oriented to Place Alcohol / Substance use:  Not Applicable Psych involvement (Current and /or in the community):  No (Comment)  Discharge Needs  Concerns to be addressed:  Other (Comment Required (Return to [facility]) Readmission within the last 30 days:  No Current discharge risk:  None Barriers to Discharge:  No Barriers Identified   Zachary Gully, LCSW 06/05/2017, 3:24 PM

## 2017-06-05 NOTE — Progress Notes (Signed)
Pharmacy Antibiotic Note  Zachary [name] is a 71 y.o. male admitted on 06/02/2017 with fever.  Pharmacy has been consulted for Vancomycin and Zosyn dosing.  Plan: Vancomycin 750mg  IV with each dialysis session Zosyn 3.375gm IV q12hrs (dialysis dosing) Monitor labs, progress, c/s  Height: 6' (182.9 cm) Weight: 171 lb 15.3 oz (78 kg) IBW/kg (Calculated) : 77.6  Temp (24hrs), Avg:99.7 F (37.6 C), Min:98.8 F (37.1 C), Max:100.6 F (38.1 C)   Recent Labs Lab 05/30/17 2323 05/30/17 2341 06/03/17 0055 06/03/17 0110 06/03/17 0446 06/03/17 2013 06/05/17 0726  WBC 8.4  --  6.6  --  6.0 6.3 6.6  CREATININE 4.86*  --  7.33*  --  7.41*  --   --   LATICACIDVEN  --  2.59*  --  1.19  --   --   --     Estimated Creatinine Clearance: 10.5 mL/min (A) (by C-G formula based on SCr of 7.41 mg/dL (H)).    No Known Allergies  Antimicrobials this admission: Vancomycin 8/8 >>  Zosyn 8/8 >>   Dose adjustments this admission:  Microbiology results:  BCx: pending  MRSA PCR: positive  Thank you for allowing pharmacy to be a part of this patient's care.  Hart Robinsons A 06/05/2017 8:24 AM

## 2017-06-05 NOTE — Progress Notes (Signed)
Subjective: Patient feels better. His fever is subsiding. He is getting hemodialysus this morning. His Lovenox was discontinued after discussing with pharmacy. He had had over months of anticoagulation treatment for lower extremity DVT.  Objective: Vital signs in last 24 hours: Temp:  [98.8 F (37.1 C)-100.6 F (38.1 C)] 98.8 F (37.1 C) (08/10 0515) Pulse Rate:  [104-117] 104 (08/10 0515) Resp:  [15-22] 15 (08/10 0515) BP: (105-110)/(58-69) 110/69 (08/10 0515) SpO2:  [97 %-100 %] 100 % (08/10 0515) Weight change:  Last BM Date: 06/04/17  Intake/Output from previous day: 08/09 0701 - 08/10 0700 In: 663 [P.O.:360; I.V.:3; IV Piggyback:300] Out: 1 [Stool:1]  PHYSICAL EXAM General appearance: no distress and slowed mentation Resp: clear to auscultation bilaterally Cardio: S1, S2 normal GI: soft, non-tender; bowel sounds normal; no masses,  no organomegaly Extremities: extremities normal, atraumatic, no cyanosis or edema  Lab Results:  Results for orders placed or performed during the hospital encounter of 06/02/17 (from the past 48 hour(s))  Prepare RBC     Status: None   Collection Time: 06/03/17 10:54 AM  Result Value Ref Range   Order Confirmation ORDER PROCESSED BY BLOOD BANK   Hepatitis B surface antigen     Status: None   Collection Time: 06/03/17  5:30 PM  Result Value Ref Range   Hepatitis B Surface Ag Negative Negative    Comment: (NOTE) Performed At: [facility] [place], [place] [id number] Lindon Romp MD [id number]   CBC     Status: Abnormal   Collection Time: 06/03/17  8:13 PM  Result Value Ref Range   WBC 6.3 4.0 - 10.5 K/uL   RBC 3.14 (L) 4.22 - 5.81 MIL/uL   Hemoglobin 9.4 (L) 13.0 - 17.0 g/dL    Comment: DELTA CHECK NOTED   HCT 28.7 (L) 39.0 - 52.0 %   MCV 91.4 78.0 - 100.0 fL   MCH 29.9 26.0 - 34.0 pg   MCHC 32.8 30.0 - 36.0 g/dL   RDW 17.3 (H) 11.5 - 15.5 %   Platelets 237 150 - 400 K/uL  Glucose, capillary      Status: None   Collection Time: 06/04/17 12:00 AM  Result Value Ref Range   Glucose-Capillary 79 65 - 99 mg/dL  Glucose, capillary     Status: None   Collection Time: 06/04/17  7:42 AM  Result Value Ref Range   Glucose-Capillary 90 65 - 99 mg/dL  Glucose, capillary     Status: None   Collection Time: 06/04/17 11:24 AM  Result Value Ref Range   Glucose-Capillary 98 65 - 99 mg/dL  Glucose, capillary     Status: Abnormal   Collection Time: 06/04/17  4:10 PM  Result Value Ref Range   Glucose-Capillary 105 (H) 65 - 99 mg/dL  Glucose, capillary     Status: Abnormal   Collection Time: 06/04/17  8:48 PM  Result Value Ref Range   Glucose-Capillary 123 (H) 65 - 99 mg/dL  CBC     Status: Abnormal   Collection Time: 06/05/17  7:26 AM  Result Value Ref Range   WBC 6.6 4.0 - 10.5 K/uL   RBC 2.62 (L) 4.22 - 5.81 MIL/uL   Hemoglobin 7.9 (L) 13.0 - 17.0 g/dL   HCT 24.0 (L) 39.0 - 52.0 %   MCV 91.6 78.0 - 100.0 fL   MCH 30.2 26.0 - 34.0 pg   MCHC 32.9 30.0 - 36.0 g/dL   RDW 17.8 (H) 11.5 - 15.5 %  Platelets 249 150 - 400 K/uL    ABGS No results for input(s): PHART, PO2ART, TCO2, HCO3 in the last 72 hours.  Invalid input(s): PCO2 CULTURES Recent Results (from the past 240 hour(s))  Blood Culture (routine x 2)     Status: None (Preliminary result)   Collection Time: 06/03/17 12:55 AM  Result Value Ref Range Status   Specimen Description BLOOD LEFT HAND INDEX FINGER  Final   Special Requests   Final    BOTTLES DRAWN AEROBIC AND ANAEROBIC Blood Culture results may not be optimal due to an inadequate volume of blood received in culture bottles   Culture NO GROWTH 2 DAYS  Final   Report Status PENDING  Incomplete  Blood Culture (routine x 2)     Status: None (Preliminary result)   Collection Time: 06/03/17  1:14 AM  Result Value Ref Range Status   Specimen Description BLOOD LEFT HAND THUMB  Final   Special Requests   Final    BOTTLES DRAWN AEROBIC AND ANAEROBIC Blood Culture results may  not be optimal due to an inadequate volume of blood received in culture bottles   Culture NO GROWTH 2 DAYS  Final   Report Status PENDING  Incomplete  MRSA PCR Screening     Status: Abnormal   Collection Time: 06/03/17  4:06 AM  Result Value Ref Range Status   MRSA by PCR POSITIVE (A) NEGATIVE Final    Comment:        The GeneXpert MRSA Assay (FDA approved for NASAL specimens only), is one component of a comprehensive MRSA colonization surveillance program. It is not intended to diagnose MRSA infection nor to guide or monitor treatment for MRSA infections. RESULT CALLED TO, READ BACK BY AND VERIFIED WITH: MAYS,J AT 0645 BY HUFFINES,S ON 06/03/17.    Studies/Results: No results found.  Medications: I have reviewed the patient's current medications.  Assesment:   Principal Problem:   Fever Active Problems:   Mental retardation   Acute blood loss anemia   ESRD (end stage renal disease) ([facility])   Anemia in CKD (chronic kidney disease) DVT   Plan:  Medications reviewed Will continue Iv antibiotics Will follow culture result Home medications reordered Hemodialysis as per nephrology    LOS: 2 days   Alesha Jaffee 06/05/2017, 8:25 AM

## 2017-06-05 NOTE — Progress Notes (Signed)
Am medications were given late due to this was an Dialysis day for the patient and the Dialysis nurse request that the meds be given after the treatment.    The patient has had about 5 mini loose stools MD notified to request imodium.  New orders given and followed 1038 this am.

## 2017-06-05 NOTE — Care Management Important Message (Signed)
Important Message  Patient Details  Name: Zachary [name] MRN: [id number] Date of Birth: [birth year]   Medicare Important Message Given:  Yes    Sherald Barge, RN 06/05/2017, 11:19 AM

## 2017-06-06 LAB — GLUCOSE, CAPILLARY
Glucose-Capillary: 90 mg/dL (ref 65–99)
Glucose-Capillary: 92 mg/dL (ref 65–99)
Glucose-Capillary: 90 mg/dL (ref 65–99)
Glucose-Capillary: 164 mg/dL — ABNORMAL HIGH (ref 65–99)

## 2017-06-06 LAB — C DIFFICILE QUICK SCREEN W PCR REFLEX
C Diff antigen: NEGATIVE
C Diff interpretation: NOT DETECTED
C Diff toxin: NEGATIVE

## 2017-06-06 MED ORDER — SEVELAMER CARBONATE 800 MG PO TABS
800.0000 mg | ORAL_TABLET | ORAL | Status: DC
  Administered 2017-06-07 – 2017-06-08 (×2): 800 mg via ORAL
  Filled 2017-06-06: qty 1

## 2017-06-06 MED ORDER — SEVELAMER CARBONATE 800 MG PO TABS
1600.0000 mg | ORAL_TABLET | Freq: Three times a day (TID) | ORAL | Status: DC
  Administered 2017-06-06 – 2017-06-09 (×2): 1600 mg via ORAL
  Filled 2017-06-06 (×9): qty 2

## 2017-06-06 NOTE — Progress Notes (Signed)
Subjective: Patient feels better today. He had some loose stool yesterday. Blood culture is so far negative. Final report pending. .  Objective: Vital signs in last 24 hours: Temp:  [98.4 F (36.9 C)-99.5 F (37.5 C)] 99.5 F (37.5 C) (08/11 0340) Pulse Rate:  [102-120] 110 (08/11 0340) Resp:  [18] 18 (08/11 0340) BP: (84-111)/(43-62) 84/43 (08/11 0340) SpO2:  [98 %-100 %] 98 % (08/11 0340) Weight:  [78.1 kg (172 lb 2.9 oz)] 78.1 kg (172 lb 2.9 oz) (08/11 0340) Weight change:  Last BM Date: 06/05/17  Intake/Output from previous day: 08/10 0701 - 08/11 0700 In: 243 [P.O.:240; I.V.:3] Out: 2005 [Stool:5]  PHYSICAL EXAM General appearance: no distress and slowed mentation Resp: clear to auscultation bilaterally Cardio: S1, S2 normal GI: soft, non-tender; bowel sounds normal; no masses,  no organomegaly Extremities: extremities normal, atraumatic, no cyanosis or edema  Lab Results:  Results for orders placed or performed during the hospital encounter of 06/02/17 (from the past 48 hour(s))  Glucose, capillary     Status: None   Collection Time: 06/04/17 11:24 AM  Result Value Ref Range   Glucose-Capillary 98 65 - 99 mg/dL  Glucose, capillary     Status: Abnormal   Collection Time: 06/04/17  4:10 PM  Result Value Ref Range   Glucose-Capillary 105 (H) 65 - 99 mg/dL  Glucose, capillary     Status: Abnormal   Collection Time: 06/04/17  8:48 PM  Result Value Ref Range   Glucose-Capillary 123 (H) 65 - 99 mg/dL  C difficile quick scan w PCR reflex     Status: None   Collection Time: 06/05/17  3:50 AM  Result Value Ref Range   C Diff antigen NEGATIVE NEGATIVE   C Diff toxin NEGATIVE NEGATIVE   C Diff interpretation No C. difficile detected.     Comment: VALID  CBC     Status: Abnormal   Collection Time: 06/05/17  7:26 AM  Result Value Ref Range   WBC 6.6 4.0 - 10.5 K/uL   RBC 2.62 (L) 4.22 - 5.81 MIL/uL   Hemoglobin 7.9 (L) 13.0 - 17.0 g/dL   HCT 24.0 (L) 39.0 - 52.0 %   MCV 91.6 78.0 - 100.0 fL   MCH 30.2 26.0 - 34.0 pg   MCHC 32.9 30.0 - 36.0 g/dL   RDW 17.8 (H) 11.5 - 15.5 %   Platelets 249 150 - 400 K/uL  Renal function panel     Status: Abnormal   Collection Time: 06/05/17  7:26 AM  Result Value Ref Range   Sodium 134 (L) 135 - 145 mmol/L   Potassium 3.3 (L) 3.5 - 5.1 mmol/L   Chloride 97 (L) 101 - 111 mmol/L   CO2 25 22 - 32 mmol/L   Glucose, Bld 95 65 - 99 mg/dL   BUN 46 (H) 6 - 20 mg/dL   Creatinine, Ser 5.13 (H) 0.61 - 1.24 mg/dL   Calcium 7.4 (L) 8.9 - 10.3 mg/dL   Phosphorus 6.7 (H) 2.5 - 4.6 mg/dL   Albumin 2.0 (L) 3.5 - 5.0 g/dL   GFR calc non Af Amer 10 (L) >60 mL/min   GFR calc Af Amer 12 (L) >60 mL/min    Comment: (NOTE) The eGFR has been calculated using the CKD EPI equation. This calculation has not been validated in all clinical situations. eGFR's persistently <60 mL/min signify possible Chronic Kidney Disease.    Anion gap 12 5 - 15  Glucose, capillary     Status: None  Collection Time: 06/05/17  8:33 AM  Result Value Ref Range   Glucose-Capillary 96 65 - 99 mg/dL  Glucose, capillary     Status: None   Collection Time: 06/05/17 11:12 AM  Result Value Ref Range   Glucose-Capillary 91 65 - 99 mg/dL  Glucose, capillary     Status: Abnormal   Collection Time: 06/05/17  4:15 PM  Result Value Ref Range   Glucose-Capillary 102 (H) 65 - 99 mg/dL  Glucose, capillary     Status: Abnormal   Collection Time: 06/05/17 10:11 PM  Result Value Ref Range   Glucose-Capillary 101 (H) 65 - 99 mg/dL  Glucose, capillary     Status: Abnormal   Collection Time: 06/05/17 10:31 PM  Result Value Ref Range   Glucose-Capillary 114 (H) 65 - 99 mg/dL   Comment 1 Notify RN    Comment 2 Document in Chart   Glucose, capillary     Status: None   Collection Time: 06/06/17  8:09 AM  Result Value Ref Range   Glucose-Capillary 90 65 - 99 mg/dL    ABGS No results for input(s): PHART, PO2ART, TCO2, HCO3 in the last 72 hours.  Invalid input(s):  PCO2 CULTURES Recent Results (from the past 240 hour(s))  Blood Culture (routine x 2)     Status: None (Preliminary result)   Collection Time: 06/03/17 12:55 AM  Result Value Ref Range Status   Specimen Description BLOOD LEFT HAND INDEX FINGER  Final   Special Requests   Final    BOTTLES DRAWN AEROBIC AND ANAEROBIC Blood Culture results may not be optimal due to an inadequate volume of blood received in culture bottles   Culture NO GROWTH 2 DAYS  Final   Report Status PENDING  Incomplete  Blood Culture (routine x 2)     Status: None (Preliminary result)   Collection Time: 06/03/17  1:14 AM  Result Value Ref Range Status   Specimen Description BLOOD LEFT HAND THUMB  Final   Special Requests   Final    BOTTLES DRAWN AEROBIC AND ANAEROBIC Blood Culture results may not be optimal due to an inadequate volume of blood received in culture bottles   Culture NO GROWTH 2 DAYS  Final   Report Status PENDING  Incomplete  MRSA PCR Screening     Status: Abnormal   Collection Time: 06/03/17  4:06 AM  Result Value Ref Range Status   MRSA by PCR POSITIVE (A) NEGATIVE Final    Comment:        The GeneXpert MRSA Assay (FDA approved for NASAL specimens only), is one component of a comprehensive MRSA colonization surveillance program. It is not intended to diagnose MRSA infection nor to guide or monitor treatment for MRSA infections. RESULT CALLED TO, READ BACK BY AND VERIFIED WITH: MAYS,J AT 0645 BY HUFFINES,S ON 06/03/17.   C difficile quick scan w PCR reflex     Status: None   Collection Time: 06/05/17  3:50 AM  Result Value Ref Range Status   C Diff antigen NEGATIVE NEGATIVE Final   C Diff toxin NEGATIVE NEGATIVE Final   C Diff interpretation No C. difficile detected.  Final    Comment: VALID   Studies/Results: No results found.  Medications: I have reviewed the patient's current medications.  Assesment:   Principal Problem:   Fever Active Problems:   Mental retardation   Acute  blood loss anemia   ESRD (end stage renal disease) ([place])   Anemia in CKD (chronic kidney disease) DVT  Plan:  Medications reviewed Will continue Iv antibiotics Will follow culture result Hemodialysis as per nephrology    LOS: 3 days   Zachary [name] 06/06/2017, 8:38 AM

## 2017-06-06 NOTE — Progress Notes (Signed)
Subjective: Interval History: Patient denies any nausea or vomiting. He denies any difficulty breathing.  Objective: Vital signs in last 24 hours: Temp:  [98.4 F (36.9 C)-99.5 F (37.5 C)] 99.5 F (37.5 C) (08/11 0340) Pulse Rate:  [102-120] 110 (08/11 0340) Resp:  [18] 18 (08/11 0340) BP: (84-111)/(43-62) 84/43 (08/11 0340) SpO2:  [98 %-100 %] 98 % (08/11 0340) Weight:  [78.1 kg (172 lb 2.9 oz)] 78.1 kg (172 lb 2.9 oz) (08/11 0340) Weight change:   Intake/Output from previous day: 08/10 0701 - 08/11 0700 In: 243 [P.O.:240; I.V.:3] Out: 2005 [Stool:5] Intake/Output this shift: No intake/output data recorded.  Generally patient is alert and in no apparent distress Chest is clear to auscultation Heart exam revealed regular rate and rhythm no murmur Extremities no edema  Lab Results:  Recent Labs  06/03/17 2013 06/05/17 0726  WBC 6.3 6.6  HGB 9.4* 7.9*  HCT 28.7* 24.0*  PLT 237 249   BMET:   Recent Labs  06/05/17 0726  NA 134*  K 3.3*  CL 97*  CO2 25  GLUCOSE 95  BUN 46*  CREATININE 5.13*  CALCIUM 7.4*   No results for input(s): PTH in the last 72 hours. Iron Studies: No results for input(s): IRON, TIBC, TRANSFERRIN, FERRITIN in the last 72 hours.  Studies/Results: No results found.  I have reviewed the patient's current medications.  Assessment/Plan: Problem #1 history of fever : Etiology presently is no clear. Patient Remains febrile. Blood cultures Didn't show any growth. Patient remains on empiric antibiotics. Problem #2 anemia: Status post blood transfusion. His last hemoglobin was stable. Problem #3 end-stage renal disease: He is status post hemodialysis yesterday. Presently he denies any nausea or vomiting. Problem #4 history of mental retardation Problem #5 bone mineral disorder: His calcium is range but his phosphorus is high. Patient presently is not on any binder. Problem #6 history of colectomy : He has some loose stool. Problem #7 history  of diabetes. Plan: 1]We'll start patient on Renvella 800 mg 2 tablets by mouth 3 times a day with meals and one with snack.  2] will check his CBC and renal panel in the morning.    LOS: 3 days   Shirah Roseman S 06/06/2017,8:53 AM

## 2017-06-07 LAB — RENAL FUNCTION PANEL
Albumin: 2 g/dL — ABNORMAL LOW (ref 3.5–5.0)
Anion gap: 11 (ref 5–15)
BUN: 43 mg/dL — ABNORMAL HIGH (ref 6–20)
CO2: 27 mmol/L (ref 22–32)
Calcium: 7.9 mg/dL — ABNORMAL LOW (ref 8.9–10.3)
Chloride: 97 mmol/L — ABNORMAL LOW (ref 101–111)
Creatinine, Ser: 5.53 mg/dL — ABNORMAL HIGH (ref 0.61–1.24)
GFR calc Af Amer: 11 mL/min — ABNORMAL LOW (ref >60)
GFR calc non Af Amer: 10 mL/min — ABNORMAL LOW (ref >60)
Glucose, Bld: 100 mg/dL — ABNORMAL HIGH (ref 65–99)
Phosphorus: 5.3 mg/dL — ABNORMAL HIGH (ref 2.5–4.6)
Potassium: 3.1 mmol/L — ABNORMAL LOW (ref 3.5–5.1)
Sodium: 135 mmol/L (ref 135–145)

## 2017-06-07 LAB — CBC
HCT: UNDETERMINED % (ref 39.0–52.0)
HCT: 22.4 % — ABNORMAL LOW (ref 39.0–52.0)
Hemoglobin: UNDETERMINED g/dL (ref 13.0–17.0)
Hemoglobin: 7.3 g/dL — ABNORMAL LOW (ref 13.0–17.0)
MCH: UNDETERMINED pg (ref 26.0–34.0)
MCH: 30.3 pg (ref 26.0–34.0)
MCHC: UNDETERMINED g/dL (ref 30.0–36.0)
MCHC: 32.6 g/dL (ref 30.0–36.0)
MCV: UNDETERMINED fL (ref 78.0–100.0)
MCV: 92.9 fL (ref 78.0–100.0)
Platelets: UNDETERMINED 10*3/uL (ref 150–400)
Platelets: 296 10*3/uL (ref 150–400)
RBC: UNDETERMINED MIL/uL (ref 4.22–5.81)
RBC: 2.41 MIL/uL — ABNORMAL LOW (ref 4.22–5.81)
RDW: UNDETERMINED % (ref 11.5–15.5)
RDW: 16.9 % — ABNORMAL HIGH (ref 11.5–15.5)
WBC: UNDETERMINED 10*3/uL (ref 4.0–10.5)
WBC: 7.9 10*3/uL (ref 4.0–10.5)

## 2017-06-07 LAB — GLUCOSE, CAPILLARY
Glucose-Capillary: 103 mg/dL — ABNORMAL HIGH (ref 65–99)
Glucose-Capillary: 84 mg/dL (ref 65–99)
Glucose-Capillary: 136 mg/dL — ABNORMAL HIGH (ref 65–99)
Glucose-Capillary: 157 mg/dL — ABNORMAL HIGH (ref 65–99)

## 2017-06-07 MED ORDER — POTASSIUM CHLORIDE CRYS ER 20 MEQ PO TBCR
40.0000 meq | EXTENDED_RELEASE_TABLET | Freq: Once | ORAL | Status: AC
  Administered 2017-06-07: 40 meq via ORAL
  Filled 2017-06-07: qty 2

## 2017-06-07 NOTE — Progress Notes (Signed)
Subjective: Patient is resting. He continued to have intermittent diarrhea, but no fever, nausea or vomiting.  Objective: Vital signs in last 24 hours: Temp:  [98.3 F (36.8 C)-98.7 F (37.1 C)] 98.6 F (37 C) (08/12 0518) Pulse Rate:  [99-104] 99 (08/12 0518) Resp:  [16-18] 16 (08/12 0518) BP: (99-113)/(50-53) 113/53 (08/12 0518) SpO2:  [98 %-100 %] 100 % (08/12 0518) Weight:  [78.2 kg (172 lb 6.4 oz)] 78.2 kg (172 lb 6.4 oz) (08/12 0518) Weight change: 0.2 kg (7.1 oz) Last BM Date: 06/07/17  Intake/Output from previous day: 08/11 0701 - 08/12 0700 In: 1016 [P.O.:960; I.V.:6; IV Piggyback:50] Out: -   PHYSICAL EXAM General appearance: no distress and slowed mentation Resp: clear to auscultation bilaterally Cardio: S1, S2 normal GI: soft, non-tender; bowel sounds normal; no masses,  no organomegaly Extremities: extremities normal, atraumatic, no cyanosis or edema  Lab Results:  Results for orders placed or performed during the hospital encounter of 06/02/17 (from the past 48 hour(s))  Glucose, capillary     Status: None   Collection Time: 06/05/17 11:12 AM  Result Value Ref Range   Glucose-Capillary 91 65 - 99 mg/dL  Glucose, capillary     Status: Abnormal   Collection Time: 06/05/17  4:15 PM  Result Value Ref Range   Glucose-Capillary 102 (H) 65 - 99 mg/dL  Glucose, capillary     Status: Abnormal   Collection Time: 06/05/17 10:11 PM  Result Value Ref Range   Glucose-Capillary 101 (H) 65 - 99 mg/dL  Glucose, capillary     Status: Abnormal   Collection Time: 06/05/17 10:31 PM  Result Value Ref Range   Glucose-Capillary 114 (H) 65 - 99 mg/dL   Comment 1 Notify RN    Comment 2 Document in Chart   Glucose, capillary     Status: None   Collection Time: 06/06/17  8:09 AM  Result Value Ref Range   Glucose-Capillary 90 65 - 99 mg/dL  Glucose, capillary     Status: None   Collection Time: 06/06/17 11:46 AM  Result Value Ref Range   Glucose-Capillary 92 65 - 99 mg/dL   Glucose, capillary     Status: None   Collection Time: 06/06/17  4:52 PM  Result Value Ref Range   Glucose-Capillary 90 65 - 99 mg/dL  Glucose, capillary     Status: Abnormal   Collection Time: 06/06/17  9:25 PM  Result Value Ref Range   Glucose-Capillary 164 (H) 65 - 99 mg/dL  CBC     Status: None   Collection Time: 06/07/17  5:00 AM  Result Value Ref Range   WBC QUANTITY NOT SUFFICIENT, UNABLE TO PERFORM TEST 4.0 - 10.5 K/uL    Comment: CORRECTED ON 08/12 AT 0745: PREVIOUSLY REPORTED AS 7.6   RBC QUANTITY NOT SUFFICIENT, UNABLE TO PERFORM TEST 4.22 - 5.81 MIL/uL    Comment: CORRECTED ON 08/12 AT 0745: PREVIOUSLY REPORTED AS 2.61   Hemoglobin QUANTITY NOT SUFFICIENT, UNABLE TO PERFORM TEST 13.0 - 17.0 g/dL    Comment: CORRECTED ON 08/12 AT 0745: PREVIOUSLY REPORTED AS 7.7   HCT QUANTITY NOT SUFFICIENT, UNABLE TO PERFORM TEST 39.0 - 52.0 %    Comment: CORRECTED ON 08/12 AT 0745: PREVIOUSLY REPORTED AS 25.1   MCV QUANTITY NOT SUFFICIENT, UNABLE TO PERFORM TEST 78.0 - 100.0 fL    Comment: CORRECTED ON 08/12 AT 0745: PREVIOUSLY REPORTED AS 96.2   MCH QUANTITY NOT SUFFICIENT, UNABLE TO PERFORM TEST 26.0 - 34.0 pg    Comment:  CORRECTED ON 08/12 AT 0745: PREVIOUSLY REPORTED AS 29.5   MCHC QUANTITY NOT SUFFICIENT, UNABLE TO PERFORM TEST 30.0 - 36.0 g/dL    Comment: CORRECTED ON 08/12 AT 0745: PREVIOUSLY REPORTED AS 30.7   RDW QUANTITY NOT SUFFICIENT, UNABLE TO PERFORM TEST 11.5 - 15.5 %    Comment: CORRECTED ON 08/12 AT 0745: PREVIOUSLY REPORTED AS 17.3   Platelets QUANTITY NOT SUFFICIENT, UNABLE TO PERFORM TEST 150 - 400 K/uL    Comment: CORRECTED ON 08/12 AT 0745: PREVIOUSLY REPORTED AS 157  Glucose, capillary     Status: Abnormal   Collection Time: 06/07/17  7:35 AM  Result Value Ref Range   Glucose-Capillary 103 (H) 65 - 99 mg/dL  Renal function panel     Status: Abnormal   Collection Time: 06/07/17  8:00 AM  Result Value Ref Range   Sodium 135 135 - 145 mmol/L   Potassium 3.1 (L)  3.5 - 5.1 mmol/L   Chloride 97 (L) 101 - 111 mmol/L   CO2 27 22 - 32 mmol/L   Glucose, Bld 100 (H) 65 - 99 mg/dL   BUN 43 (H) 6 - 20 mg/dL   Creatinine, Ser 5.53 (H) 0.61 - 1.24 mg/dL   Calcium 7.9 (L) 8.9 - 10.3 mg/dL   Phosphorus 5.3 (H) 2.5 - 4.6 mg/dL   Albumin 2.0 (L) 3.5 - 5.0 g/dL   GFR calc non Af Amer 10 (L) >60 mL/min   GFR calc Af Amer 11 (L) >60 mL/min    Comment: (NOTE) The eGFR has been calculated using the CKD EPI equation. This calculation has not been validated in all clinical situations. eGFR's persistently <60 mL/min signify possible Chronic Kidney Disease.    Anion gap 11 5 - 15  CBC     Status: Abnormal   Collection Time: 06/07/17  8:00 AM  Result Value Ref Range   WBC 7.9 4.0 - 10.5 K/uL   RBC 2.41 (L) 4.22 - 5.81 MIL/uL   Hemoglobin 7.3 (L) 13.0 - 17.0 g/dL   HCT 22.4 (L) 39.0 - 52.0 %   MCV 92.9 78.0 - 100.0 fL   MCH 30.3 26.0 - 34.0 pg   MCHC 32.6 30.0 - 36.0 g/dL   RDW 16.9 (H) 11.5 - 15.5 %   Platelets 296 150 - 400 K/uL    ABGS No results for input(s): PHART, PO2ART, TCO2, HCO3 in the last 72 hours.  Invalid input(s): PCO2 CULTURES Recent Results (from the past 240 hour(s))  Blood Culture (routine x 2)     Status: None (Preliminary result)   Collection Time: 06/03/17 12:55 AM  Result Value Ref Range Status   Specimen Description BLOOD LEFT HAND INDEX FINGER  Final   Special Requests   Final    BOTTLES DRAWN AEROBIC AND ANAEROBIC Blood Culture results may not be optimal due to an inadequate volume of blood received in culture bottles   Culture NO GROWTH 4 DAYS  Final   Report Status PENDING  Incomplete  Blood Culture (routine x 2)     Status: None (Preliminary result)   Collection Time: 06/03/17  1:14 AM  Result Value Ref Range Status   Specimen Description BLOOD LEFT HAND THUMB  Final   Special Requests   Final    BOTTLES DRAWN AEROBIC AND ANAEROBIC Blood Culture results may not be optimal due to an inadequate volume of blood received in  culture bottles   Culture NO GROWTH 4 DAYS  Final   Report Status PENDING  Incomplete  MRSA PCR Screening     Status: Abnormal   Collection Time: 06/03/17  4:06 AM  Result Value Ref Range Status   MRSA by PCR POSITIVE (A) NEGATIVE Final    Comment:        The GeneXpert MRSA Assay (FDA approved for NASAL specimens only), is one component of a comprehensive MRSA colonization surveillance program. It is not intended to diagnose MRSA infection nor to guide or monitor treatment for MRSA infections. RESULT CALLED TO, READ BACK BY AND VERIFIED WITH: MAYS,J AT 0645 BY HUFFINES,S ON 06/03/17.   C difficile quick scan w PCR reflex     Status: None   Collection Time: 06/05/17  3:50 AM  Result Value Ref Range Status   C Diff antigen NEGATIVE NEGATIVE Final   C Diff toxin NEGATIVE NEGATIVE Final   C Diff interpretation No C. difficile detected.  Final    Comment: VALID   Studies/Results: No results found.  Medications: I have reviewed the patient's current medications.  Assesment:   Principal Problem:   Fever Active Problems:   Mental retardation   Acute blood loss anemia   ESRD (end stage renal disease) ([facility])   Anemia in CKD (chronic kidney disease) DVT   Plan:  Medications reviewed Will continue Iv antibiotics Will follow culture result Hemodialysis as per nephrology    LOS: 4 days   Zachary [name] 06/07/2017, 8:55 AM

## 2017-06-07 NOTE — Progress Notes (Signed)
Subjective: Interval History: Patient offers no complaints.  Objective: Vital signs in last 24 hours: Temp:  [98.3 F (36.8 C)-98.7 F (37.1 C)] 98.6 F (37 C) (08/12 0518) Pulse Rate:  [99-104] 99 (08/12 0518) Resp:  [16-18] 16 (08/12 0518) BP: (99-113)/(50-53) 113/53 (08/12 0518) SpO2:  [98 %-100 %] 100 % (08/12 0518) Weight:  [78.2 kg (172 lb 6.4 oz)] 78.2 kg (172 lb 6.4 oz) (08/12 0518) Weight change: 0.2 kg (7.1 oz)  Intake/Output from previous day: 08/11 0701 - 08/12 0700 In: 1016 [P.O.:960; I.V.:6; IV Piggyback:50] Out: -  Intake/Output this shift: No intake/output data recorded.  Generally patient is alert and in no apparent distress Chest is clear to auscultation Heart exam revealed regular rate and rhythm no murmur Extremities no edema  Lab Results:  Recent Labs  06/07/17 0500 06/07/17 0800  WBC QUANTITY NOT SUFFICIENT, UNABLE TO PERFORM TEST 7.9  HGB QUANTITY NOT SUFFICIENT, UNABLE TO PERFORM TEST 7.3*  HCT QUANTITY NOT SUFFICIENT, UNABLE TO PERFORM TEST 22.4*  PLT QUANTITY NOT SUFFICIENT, UNABLE TO PERFORM TEST 296   BMET:   Recent Labs  06/05/17 0726 06/07/17 0800  NA 134* 135  K 3.3* 3.1*  CL 97* 97*  CO2 25 27  GLUCOSE 95 100*  BUN 46* 43*  CREATININE 5.13* 5.53*  CALCIUM 7.4* 7.9*   No results for input(s): PTH in the last 72 hours. Iron Studies: No results for input(s): IRON, TIBC, TRANSFERRIN, FERRITIN in the last 72 hours.  Studies/Results: No results found.  I have reviewed the patient's current medications.  Assessment/Plan: Problem #1 history of fever : Etiology presently is no clear. Patient Remains afebrile. Blood cultures Didn't show any growth. Patient remains on empiric antibiotics. Problem #2 anemia: Status post blood transfusion. His hemoglobin has declined again. Problem #3 end-stage renal disease: He is status post hemodialysis on Friday. Patient doesn't have any nausea or vomiting. Potassium however is low.. Problem #4  history of mental retardation Problem #5 bone mineral disorder: His calcium is normal and phosphorus has returned to our target goal. Problem #6 history of colectomy : He has some loose stool. Problem #7 history of diabetes. Plan: 1] patient does require dialysis today 2] will check his CBC and renal panel in the morning. 3] will give him KCl 40 mEq by mouth 1 dose 4] will dialyze patient tomorrow and will use 4K/2.5 calcium bath.    LOS: 4 days   Maxtyn Nuzum S 06/07/2017,8:38 AM

## 2017-06-07 NOTE — Progress Notes (Signed)
Late entry : Dr Lowanda Foster notified of the patient not taking his revella.

## 2017-06-08 LAB — RENAL FUNCTION PANEL
Albumin: 2 g/dL — ABNORMAL LOW (ref 3.5–5.0)
Anion gap: 10 (ref 5–15)
BUN: 50 mg/dL — ABNORMAL HIGH (ref 6–20)
CO2: 27 mmol/L (ref 22–32)
Calcium: 8 mg/dL — ABNORMAL LOW (ref 8.9–10.3)
Chloride: 97 mmol/L — ABNORMAL LOW (ref 101–111)
Creatinine, Ser: 5.68 mg/dL — ABNORMAL HIGH (ref 0.61–1.24)
GFR calc Af Amer: 11 mL/min — ABNORMAL LOW (ref >60)
GFR calc non Af Amer: 9 mL/min — ABNORMAL LOW (ref >60)
Glucose, Bld: 96 mg/dL (ref 65–99)
Phosphorus: 5.1 mg/dL — ABNORMAL HIGH (ref 2.5–4.6)
Potassium: 3.5 mmol/L (ref 3.5–5.1)
Sodium: 134 mmol/L — ABNORMAL LOW (ref 135–145)

## 2017-06-08 LAB — CULTURE, BLOOD (ROUTINE X 2)
Culture: NO GROWTH
Culture: NO GROWTH

## 2017-06-08 LAB — CBC
HCT: 22.6 % — ABNORMAL LOW (ref 39.0–52.0)
Hemoglobin: 7.3 g/dL — ABNORMAL LOW (ref 13.0–17.0)
MCH: 30 pg (ref 26.0–34.0)
MCHC: 32.3 g/dL (ref 30.0–36.0)
MCV: 93 fL (ref 78.0–100.0)
Platelets: 315 10*3/uL (ref 150–400)
RBC: 2.43 MIL/uL — ABNORMAL LOW (ref 4.22–5.81)
RDW: 17 % — ABNORMAL HIGH (ref 11.5–15.5)
WBC: 7.1 10*3/uL (ref 4.0–10.5)

## 2017-06-08 LAB — GLUCOSE, CAPILLARY
Glucose-Capillary: 87 mg/dL (ref 65–99)
Glucose-Capillary: 93 mg/dL (ref 65–99)
Glucose-Capillary: 87 mg/dL (ref 65–99)
Glucose-Capillary: 230 mg/dL — ABNORMAL HIGH (ref 65–99)

## 2017-06-08 MED ORDER — SODIUM CHLORIDE 0.9 % IV SOLN: 100.0000 mL | INTRAVENOUS | Status: DC | PRN

## 2017-06-08 MED ORDER — EPOETIN ALFA 10000 UNIT/ML IJ SOLN
INTRAMUSCULAR | Status: AC
  Administered 2017-06-08: 10000 [IU] via INTRAVENOUS
  Filled 2017-06-08: qty 1

## 2017-06-08 NOTE — Progress Notes (Signed)
Subjective: Interval History: Patient denies any difficulty breathing. He denies also any nausea or vomiting.  Objective: Vital signs in last 24 hours: Temp:  [98.4 F (36.9 C)-98.6 F (37 C)] 98.6 F (37 C) (08/13 0425) Pulse Rate:  [86-99] 90 (08/13 0425) Resp:  [16] 16 (08/13 0425) BP: (91-111)/(49-60) 106/60 (08/13 0425) SpO2:  [99 %-100 %] 100 % (08/13 0425) Weight:  [79.8 kg (175 lb 14.8 oz)] 79.8 kg (175 lb 14.8 oz) (08/13 0425) Weight change: 1.6 kg (3 lb 8.4 oz)  Intake/Output from previous day: 08/12 0701 - 08/13 0700 In: 240 [P.O.:240] Out: -  Intake/Output this shift: No intake/output data recorded.  Generally patient is alert and in no apparent distress Chest is clear to auscultation Heart exam revealed regular rate and rhythm no murmur Extremities no edema  Lab Results:  Recent Labs  06/07/17 0800 06/08/17 0518  WBC 7.9 7.1  HGB 7.3* 7.3*  HCT 22.4* 22.6*  PLT 296 315   BMET:   Recent Labs  06/07/17 0800 06/08/17 0519  NA 135 134*  K 3.1* 3.5  CL 97* 97*  CO2 27 27  GLUCOSE 100* 96  BUN 43* 50*  CREATININE 5.53* 5.68*  CALCIUM 7.9* 8.0*   No results for input(s): PTH in the last 72 hours. Iron Studies: No results for input(s): IRON, TIBC, TRANSFERRIN, FERRITIN in the last 72 hours.  Studies/Results: No results found.  I have reviewed the patient's current medications.  Assessment/Plan: Problem #1 history of fever : Patient remains afebrile and his white blood cell count is normal. Blood culture is negative. Problem #2 anemia: Status post blood transfusion. His hemoglobin is low but stable. Problem #3 end-stage renal disease: He is status post hemodialysis on Friday. Patient doesn't have uremic sign and symptoms. His potassium is normal. He was given potassium supplements yesterday. Problem #4 history of mental retardation Problem #5 bone mineral disorder: His calcium is normal and phosphorus has returned to our target goal. Problem #6  history of colectomy : He has some loose stool. Problem #7 history of diabetes. Plan: 1] will dialyze patient and remove about 2 L. 2] will continue his Epogen at present dose. 3] will check his CBC and renal panel in the morning    LOS: 5 days   Geneviene Tesch S 06/08/2017,8:37 AM

## 2017-06-08 NOTE — Progress Notes (Signed)
Pharmacy Antibiotic Note  Zachary [name] is a 71 y.o. male admitted on 06/02/2017 with fever.  Pharmacy has been consulted for Vancomycin and Zosyn dosing.  Plan: Vancomycin 750mg  IV with each dialysis session Zosyn 3.375gm IV q12hrs (dialysis dosing) Monitor labs, progress, c/s  Height: 6' (182.9 cm) Weight: 175 lb 14.8 oz (79.8 kg) IBW/kg (Calculated) : 77.6  Temp (24hrs), Avg:98.6 F (37 C), Min:98.4 F (36.9 C), Max:98.6 F (37 C)   Recent Labs Lab 06/03/17 0055 06/03/17 0110 06/03/17 0446 06/03/17 2013 06/05/17 0726 06/07/17 0500 06/07/17 0800 06/08/17 0518 06/08/17 0519  WBC 6.6  --  6.0 6.3 6.6 QUANTITY NOT SUFFICIENT, UNABLE TO PERFORM TEST 7.9 7.1  --   CREATININE 7.33*  --  7.41*  --  5.13*  --  5.53*  --  5.68*  LATICACIDVEN  --  1.19  --   --   --   --   --   --   --     Estimated Creatinine Clearance: 13.7 mL/min (A) (by C-G formula based on SCr of 5.68 mg/dL (H)).    No Known Allergies  Antimicrobials this admission: Vancomycin 8/8 >>  Zosyn 8/8 >>   Dose adjustments this admission:  Microbiology results:  BCx: pending  MRSA PCR: positive  Thank you for allowing pharmacy to be a part of this patient's care.  Hart Robinsons A 06/08/2017 9:38 AM

## 2017-06-08 NOTE — Progress Notes (Signed)
Subjective: Patient feels better. His diarrhea is improving. No fever or chills..  Objective: Vital signs in last 24 hours: Temp:  [98.4 F (36.9 C)-98.6 F (37 C)] 98.6 F (37 C) (08/13 0830) Pulse Rate:  [86-115] 100 (08/13 1200) Resp:  [16] 16 (08/13 0830) BP: (91-115)/(48-66) 103/57 (08/13 1200) SpO2:  [99 %-100 %] 100 % (08/13 0830) Weight:  [79.8 kg (175 lb 14.8 oz)] 79.8 kg (175 lb 14.8 oz) (08/13 0830) Weight change: 1.6 kg (3 lb 8.4 oz) Last BM Date: 06/07/17  Intake/Output from previous day: 08/12 0701 - 08/13 0700 In: 240 [P.O.:240] Out: -   PHYSICAL EXAM General appearance: no distress and slowed mentation Resp: clear to auscultation bilaterally Cardio: S1, S2 normal GI: soft, non-tender; bowel sounds normal; no masses,  no organomegaly Extremities: extremities normal, atraumatic, no cyanosis or edema  Lab Results:  Results for orders placed or performed during the hospital encounter of 06/02/17 (from the past 48 hour(s))  Glucose, capillary     Status: None   Collection Time: 06/06/17  4:52 PM  Result Value Ref Range   Glucose-Capillary 90 65 - 99 mg/dL  Glucose, capillary     Status: Abnormal   Collection Time: 06/06/17  9:25 PM  Result Value Ref Range   Glucose-Capillary 164 (H) 65 - 99 mg/dL  CBC     Status: None   Collection Time: 06/07/17  5:00 AM  Result Value Ref Range   WBC QUANTITY NOT SUFFICIENT, UNABLE TO PERFORM TEST 4.0 - 10.5 K/uL    Comment: CORRECTED ON 08/12 AT 0745: PREVIOUSLY REPORTED AS 7.6   RBC QUANTITY NOT SUFFICIENT, UNABLE TO PERFORM TEST 4.22 - 5.81 MIL/uL    Comment: CORRECTED ON 08/12 AT 0745: PREVIOUSLY REPORTED AS 2.61   Hemoglobin QUANTITY NOT SUFFICIENT, UNABLE TO PERFORM TEST 13.0 - 17.0 g/dL    Comment: CORRECTED ON 08/12 AT 0745: PREVIOUSLY REPORTED AS 7.7   HCT QUANTITY NOT SUFFICIENT, UNABLE TO PERFORM TEST 39.0 - 52.0 %    Comment: CORRECTED ON 08/12 AT 0745: PREVIOUSLY REPORTED AS 25.1   MCV QUANTITY NOT  SUFFICIENT, UNABLE TO PERFORM TEST 78.0 - 100.0 fL    Comment: CORRECTED ON 08/12 AT 0745: PREVIOUSLY REPORTED AS 96.2   MCH QUANTITY NOT SUFFICIENT, UNABLE TO PERFORM TEST 26.0 - 34.0 pg    Comment: CORRECTED ON 08/12 AT 0745: PREVIOUSLY REPORTED AS 29.5   MCHC QUANTITY NOT SUFFICIENT, UNABLE TO PERFORM TEST 30.0 - 36.0 g/dL    Comment: CORRECTED ON 08/12 AT 0745: PREVIOUSLY REPORTED AS 30.7   RDW QUANTITY NOT SUFFICIENT, UNABLE TO PERFORM TEST 11.5 - 15.5 %    Comment: CORRECTED ON 08/12 AT 0745: PREVIOUSLY REPORTED AS 17.3   Platelets QUANTITY NOT SUFFICIENT, UNABLE TO PERFORM TEST 150 - 400 K/uL    Comment: CORRECTED ON 08/12 AT 0745: PREVIOUSLY REPORTED AS 157  Glucose, capillary     Status: Abnormal   Collection Time: 06/07/17  7:35 AM  Result Value Ref Range   Glucose-Capillary 103 (H) 65 - 99 mg/dL  Renal function panel     Status: Abnormal   Collection Time: 06/07/17  8:00 AM  Result Value Ref Range   Sodium 135 135 - 145 mmol/L   Potassium 3.1 (L) 3.5 - 5.1 mmol/L   Chloride 97 (L) 101 - 111 mmol/L   CO2 27 22 - 32 mmol/L   Glucose, Bld 100 (H) 65 - 99 mg/dL   BUN 43 (H) 6 - 20 mg/dL  Creatinine, Ser 5.53 (H) 0.61 - 1.24 mg/dL   Calcium 7.9 (L) 8.9 - 10.3 mg/dL   Phosphorus 5.3 (H) 2.5 - 4.6 mg/dL   Albumin 2.0 (L) 3.5 - 5.0 g/dL   GFR calc non Af Amer 10 (L) >60 mL/min   GFR calc Af Amer 11 (L) >60 mL/min    Comment: (NOTE) The eGFR has been calculated using the CKD EPI equation. This calculation has not been validated in all clinical situations. eGFR's persistently <60 mL/min signify possible Chronic Kidney Disease.    Anion gap 11 5 - 15  CBC     Status: Abnormal   Collection Time: 06/07/17  8:00 AM  Result Value Ref Range   WBC 7.9 4.0 - 10.5 K/uL   RBC 2.41 (L) 4.22 - 5.81 MIL/uL   Hemoglobin 7.3 (L) 13.0 - 17.0 g/dL   HCT 22.4 (L) 39.0 - 52.0 %   MCV 92.9 78.0 - 100.0 fL   MCH 30.3 26.0 - 34.0 pg   MCHC 32.6 30.0 - 36.0 g/dL   RDW 16.9 (H) 11.5 - 15.5 %    Platelets 296 150 - 400 K/uL  Glucose, capillary     Status: None   Collection Time: 06/07/17 11:31 AM  Result Value Ref Range   Glucose-Capillary 84 65 - 99 mg/dL  Glucose, capillary     Status: Abnormal   Collection Time: 06/07/17  5:15 PM  Result Value Ref Range   Glucose-Capillary 136 (H) 65 - 99 mg/dL  Glucose, capillary     Status: Abnormal   Collection Time: 06/07/17  9:26 PM  Result Value Ref Range   Glucose-Capillary 157 (H) 65 - 99 mg/dL  CBC     Status: Abnormal   Collection Time: 06/08/17  5:18 AM  Result Value Ref Range   WBC 7.1 4.0 - 10.5 K/uL   RBC 2.43 (L) 4.22 - 5.81 MIL/uL   Hemoglobin 7.3 (L) 13.0 - 17.0 g/dL   HCT 22.6 (L) 39.0 - 52.0 %   MCV 93.0 78.0 - 100.0 fL   MCH 30.0 26.0 - 34.0 pg   MCHC 32.3 30.0 - 36.0 g/dL   RDW 17.0 (H) 11.5 - 15.5 %   Platelets 315 150 - 400 K/uL  Renal function panel     Status: Abnormal   Collection Time: 06/08/17  5:19 AM  Result Value Ref Range   Sodium 134 (L) 135 - 145 mmol/L   Potassium 3.5 3.5 - 5.1 mmol/L   Chloride 97 (L) 101 - 111 mmol/L   CO2 27 22 - 32 mmol/L   Glucose, Bld 96 65 - 99 mg/dL   BUN 50 (H) 6 - 20 mg/dL   Creatinine, Ser 5.68 (H) 0.61 - 1.24 mg/dL   Calcium 8.0 (L) 8.9 - 10.3 mg/dL   Phosphorus 5.1 (H) 2.5 - 4.6 mg/dL   Albumin 2.0 (L) 3.5 - 5.0 g/dL   GFR calc non Af Amer 9 (L) >60 mL/min   GFR calc Af Amer 11 (L) >60 mL/min    Comment: (NOTE) The eGFR has been calculated using the CKD EPI equation. This calculation has not been validated in all clinical situations. eGFR's persistently <60 mL/min signify possible Chronic Kidney Disease.    Anion gap 10 5 - 15  Glucose, capillary     Status: None   Collection Time: 06/08/17  7:35 AM  Result Value Ref Range   Glucose-Capillary 87 65 - 99 mg/dL  Glucose, capillary     Status: None  Collection Time: 06/08/17 11:27 AM  Result Value Ref Range   Glucose-Capillary 93 65 - 99 mg/dL    ABGS No results for input(s): PHART, PO2ART, TCO2,  HCO3 in the last 72 hours.  Invalid input(s): PCO2 CULTURES Recent Results (from the past 240 hour(s))  Blood Culture (routine x 2)     Status: None   Collection Time: 06/03/17 12:55 AM  Result Value Ref Range Status   Specimen Description BLOOD LEFT HAND INDEX FINGER  Final   Special Requests   Final    BOTTLES DRAWN AEROBIC AND ANAEROBIC Blood Culture results may not be optimal due to an inadequate volume of blood received in culture bottles   Culture NO GROWTH 5 DAYS  Final   Report Status 06/08/2017 FINAL  Final  Blood Culture (routine x 2)     Status: None   Collection Time: 06/03/17  1:14 AM  Result Value Ref Range Status   Specimen Description BLOOD LEFT HAND THUMB  Final   Special Requests   Final    BOTTLES DRAWN AEROBIC AND ANAEROBIC Blood Culture results may not be optimal due to an inadequate volume of blood received in culture bottles   Culture NO GROWTH 5 DAYS  Final   Report Status 06/08/2017 FINAL  Final  MRSA PCR Screening     Status: Abnormal   Collection Time: 06/03/17  4:06 AM  Result Value Ref Range Status   MRSA by PCR POSITIVE (A) NEGATIVE Final    Comment:        The GeneXpert MRSA Assay (FDA approved for NASAL specimens only), is one component of a comprehensive MRSA colonization surveillance program. It is not intended to diagnose MRSA infection nor to guide or monitor treatment for MRSA infections. RESULT CALLED TO, READ BACK BY AND VERIFIED WITH: MAYS,J AT 0645 BY HUFFINES,S ON 06/03/17.   C difficile quick scan w PCR reflex     Status: None   Collection Time: 06/05/17  3:50 AM  Result Value Ref Range Status   C Diff antigen NEGATIVE NEGATIVE Final   C Diff toxin NEGATIVE NEGATIVE Final   C Diff interpretation No C. difficile detected.  Final    Comment: VALID   Studies/Results: No results found.  Medications: I have reviewed the patient's current medications.  Assesment:   Principal Problem:   Fever Active Problems:   Mental  retardation   Acute blood loss anemia   ESRD (end stage renal disease) (HCC)   Anemia in CKD (chronic kidney disease) DVT   Plan:  Medications reviewed Will continue Iv antibiotics Will follow culture result Hemodialysis as per nephrology    LOS: 5 days   Ketara Cavness 06/08/2017, 1:10 PM

## 2017-06-08 NOTE — Progress Notes (Signed)
Dialysis nurse in patients room currently.

## 2017-06-09 LAB — GLUCOSE, CAPILLARY
Glucose-Capillary: 127 mg/dL — ABNORMAL HIGH (ref 65–99)
Glucose-Capillary: 113 mg/dL — ABNORMAL HIGH (ref 65–99)
Glucose-Capillary: 130 mg/dL — ABNORMAL HIGH (ref 65–99)
Glucose-Capillary: 154 mg/dL — ABNORMAL HIGH (ref 65–99)

## 2017-06-09 LAB — PREPARE RBC (CROSSMATCH)

## 2017-06-09 MED ORDER — SODIUM CHLORIDE 0.9 % IV SOLN
Freq: Once | INTRAVENOUS | Status: AC
  Administered 2017-06-09: 14:00:00 via INTRAVENOUS

## 2017-06-09 NOTE — Consult Note (Signed)
   [facility] CM Inpatient Consult   06/09/2017  Zachary [name] May 25, 1946 [id number]   Patient screened for potential [facility] services. Patient is on the [facility] registry as a benefit of their [employer] medicare . Electronic medical record reveals patient's discharge plan is to return to SNF. [facility] Care Management services not appropriate at this time. If patient's post hospital needs change please place a [facility] Care Management consult. For questions please contact:   Chonte Ricke RN, [facility] Liaison  [phone]) Business Mobile [phone]) Toll free office

## 2017-06-09 NOTE — Progress Notes (Signed)
Subjective: Interval History: Patient offers no complaints. He does have any nausea or abdominal pain.   Objective: Vital signs in last 24 hours: Temp:  [98.7 F (37.1 C)-99.6 F (37.6 C)] 99.4 F (37.4 C) (08/14 0317) Pulse Rate:  [98-117] 103 (08/14 0317) Resp:  [18-20] 20 (08/14 0317) BP: (92-115)/(48-66) 100/62 (08/14 0317) SpO2:  [100 %] 100 % (08/14 0317) Weight:  [78.4 kg (172 lb 14.4 oz)] 78.4 kg (172 lb 14.4 oz) (08/14 0317) Weight change: 0 kg (0 lb)  Intake/Output from previous day: 08/13 0701 - 08/14 0700 In: 600 [P.O.:600] Out: 2000  Intake/Output this shift: No intake/output data recorded.  Generally patient is alert and in no apparent distress Chest is clear to auscultation Heart exam revealed regular rate and rhythm no murmur Extremities no edema  Lab Results:  Recent Labs  06/07/17 0800 06/08/17 0518  WBC 7.9 7.1  HGB 7.3* 7.3*  HCT 22.4* 22.6*  PLT 296 315   BMET:   Recent Labs  06/07/17 0800 06/08/17 0519  NA 135 134*  K 3.1* 3.5  CL 97* 97*  CO2 27 27  GLUCOSE 100* 96  BUN 43* 50*  CREATININE 5.53* 5.68*  CALCIUM 7.9* 8.0*   No results for input(s): PTH in the last 72 hours. Iron Studies: No results for input(s): IRON, TIBC, TRANSFERRIN, FERRITIN in the last 72 hours.  Studies/Results: No results found.  I have reviewed the patient's current medications.  Assessment/Plan: Problem #1 history of fever : Patient remains afebrile and his white blood cell count is normal. Blood culture is negative. Problem #2 anemia: His hemoglobin is 7.3 from yesterday. Low but stable. Patient has received blood transfusion and his hemoglobin went up up to 9 before start declining.   Problem #3 end-stage renal disease: He is status post hemodialysis yesterday.  Problem #4 history of mental retardation Problem #5 bone mineral disorder: His calcium is normal and phosphorus has returned to our target goal. Problem #6 history of colectomy :  Problem #7  history of diabetes. Plan: 1] patient doesn't require dialysis today. We'll dialyze him in the morning. Agree with blood transfusion. 2] will continue his Epogen at present dose. 3] will check his CBC and renal panel in the morning    LOS: 6 days   Ally Knodel S 06/09/2017,8:43 AM

## 2017-06-09 NOTE — Progress Notes (Signed)
Subjective: Patient is resting. His fever and diarrhea has subsided. His blood culture is negative. His hemoglobin is dropped to 7.3. Discussed with Dr. Lowanda Foster his nephrologist. Advised to to transfuse 2 units today and will recheck H/h tomorrow. If patient remained stable will discharge him tomorrow after dialysis.  Objective: Vital signs in last 24 hours: Temp:  [98.6 F (37 C)-99.6 F (37.6 C)] 99.4 F (37.4 C) (08/14 0317) Pulse Rate:  [98-117] 103 (08/14 0317) Resp:  [16-20] 20 (08/14 0317) BP: (92-115)/(48-66) 100/62 (08/14 0317) SpO2:  [100 %] 100 % (08/14 0317) Weight:  [78.4 kg (172 lb 14.4 oz)-79.8 kg (175 lb 14.8 oz)] 78.4 kg (172 lb 14.4 oz) (08/14 0317) Weight change: 0 kg (0 lb) Last BM Date: 06/08/17  Intake/Output from previous day: 08/13 0701 - 08/14 0700 In: 600 [P.O.:600] Out: 2000   PHYSICAL EXAM General appearance: no distress and slowed mentation Resp: clear to auscultation bilaterally Cardio: S1, S2 normal GI: soft, non-tender; bowel sounds normal; no masses,  no organomegaly Extremities: extremities normal, atraumatic, no cyanosis or edema  Lab Results:  Results for orders placed or performed during the hospital encounter of 06/02/17 (from the past 48 hour(s))  Glucose, capillary     Status: None   Collection Time: 06/07/17 11:31 AM  Result Value Ref Range   Glucose-Capillary 84 65 - 99 mg/dL  Glucose, capillary     Status: Abnormal   Collection Time: 06/07/17  5:15 PM  Result Value Ref Range   Glucose-Capillary 136 (H) 65 - 99 mg/dL  Glucose, capillary     Status: Abnormal   Collection Time: 06/07/17  9:26 PM  Result Value Ref Range   Glucose-Capillary 157 (H) 65 - 99 mg/dL  CBC     Status: Abnormal   Collection Time: 06/08/17  5:18 AM  Result Value Ref Range   WBC 7.1 4.0 - 10.5 K/uL   RBC 2.43 (L) 4.22 - 5.81 MIL/uL   Hemoglobin 7.3 (L) 13.0 - 17.0 g/dL   HCT 22.6 (L) 39.0 - 52.0 %   MCV 93.0 78.0 - 100.0 fL   MCH 30.0 26.0 - 34.0 pg   MCHC 32.3 30.0 - 36.0 g/dL   RDW 17.0 (H) 11.5 - 15.5 %   Platelets 315 150 - 400 K/uL  Renal function panel     Status: Abnormal   Collection Time: 06/08/17  5:19 AM  Result Value Ref Range   Sodium 134 (L) 135 - 145 mmol/L   Potassium 3.5 3.5 - 5.1 mmol/L   Chloride 97 (L) 101 - 111 mmol/L   CO2 27 22 - 32 mmol/L   Glucose, Bld 96 65 - 99 mg/dL   BUN 50 (H) 6 - 20 mg/dL   Creatinine, Ser 5.68 (H) 0.61 - 1.24 mg/dL   Calcium 8.0 (L) 8.9 - 10.3 mg/dL   Phosphorus 5.1 (H) 2.5 - 4.6 mg/dL   Albumin 2.0 (L) 3.5 - 5.0 g/dL   GFR calc non Af Amer 9 (L) >60 mL/min   GFR calc Af Amer 11 (L) >60 mL/min    Comment: (NOTE) The eGFR has been calculated using the CKD EPI equation. This calculation has not been validated in all clinical situations. eGFR's persistently <60 mL/min signify possible Chronic Kidney Disease.    Anion gap 10 5 - 15  Glucose, capillary     Status: None   Collection Time: 06/08/17  7:35 AM  Result Value Ref Range   Glucose-Capillary 87 65 - 99 mg/dL  Glucose,  capillary     Status: None   Collection Time: 06/08/17 11:27 AM  Result Value Ref Range   Glucose-Capillary 93 65 - 99 mg/dL  Glucose, capillary     Status: None   Collection Time: 06/08/17  4:30 PM  Result Value Ref Range   Glucose-Capillary 87 65 - 99 mg/dL   Comment 1 Notify RN    Comment 2 Document in Chart   Glucose, capillary     Status: Abnormal   Collection Time: 06/08/17  9:34 PM  Result Value Ref Range   Glucose-Capillary 230 (H) 65 - 99 mg/dL   Comment 1 Notify RN    Comment 2 Document in Chart   Glucose, capillary     Status: Abnormal   Collection Time: 06/09/17  7:32 AM  Result Value Ref Range   Glucose-Capillary 127 (H) 65 - 99 mg/dL    ABGS No results for input(s): PHART, PO2ART, TCO2, HCO3 in the last 72 hours.  Invalid input(s): PCO2 CULTURES Recent Results (from the past 240 hour(s))  Blood Culture (routine x 2)     Status: None   Collection Time: 06/03/17 12:55 AM  Result  Value Ref Range Status   Specimen Description BLOOD LEFT HAND INDEX FINGER  Final   Special Requests   Final    BOTTLES DRAWN AEROBIC AND ANAEROBIC Blood Culture results may not be optimal due to an inadequate volume of blood received in culture bottles   Culture NO GROWTH 5 DAYS  Final   Report Status 06/08/2017 FINAL  Final  Blood Culture (routine x 2)     Status: None   Collection Time: 06/03/17  1:14 AM  Result Value Ref Range Status   Specimen Description BLOOD LEFT HAND THUMB  Final   Special Requests   Final    BOTTLES DRAWN AEROBIC AND ANAEROBIC Blood Culture results may not be optimal due to an inadequate volume of blood received in culture bottles   Culture NO GROWTH 5 DAYS  Final   Report Status 06/08/2017 FINAL  Final  MRSA PCR Screening     Status: Abnormal   Collection Time: 06/03/17  4:06 AM  Result Value Ref Range Status   MRSA by PCR POSITIVE (A) NEGATIVE Final    Comment:        The GeneXpert MRSA Assay (FDA approved for NASAL specimens only), is one component of a comprehensive MRSA colonization surveillance program. It is not intended to diagnose MRSA infection nor to guide or monitor treatment for MRSA infections. RESULT CALLED TO, READ BACK BY AND VERIFIED WITH: MAYS,J AT 0645 BY HUFFINES,S ON 06/03/17.   C difficile quick scan w PCR reflex     Status: None   Collection Time: 06/05/17  3:50 AM  Result Value Ref Range Status   C Diff antigen NEGATIVE NEGATIVE Final   C Diff toxin NEGATIVE NEGATIVE Final   C Diff interpretation No C. difficile detected.  Final    Comment: VALID   Studies/Results: No results found.  Medications: I have reviewed the patient's current medications.  Assesment:   Principal Problem:   Fever Active Problems:   Mental retardation   Acute blood loss anemia   ESRD (end stage renal disease) (HCC)   Anemia in CKD (chronic kidney disease) DVT   Plan:  Medications reviewed Will type and transfuse 2 units PRBC Will  adjust his antibiotics continue maintenance hemodialysis.    LOS: 6 days   Zachary [name] 06/09/2017, 8:12 AM

## 2017-06-09 NOTE — Care Management Note (Signed)
Case Management Note  Patient Details  Name: Zachary [name] MRN: [id number] Date of Birth: 09/15/1946  If discussed at Long Length of Stay Meetings, dates discussed:  06/09/2017  Sherald Barge, RN 06/09/2017, 1:42 PM

## 2017-06-09 NOTE — Care Management (Signed)
From [facility] as long term resident. CSW will follow and make arrangements for return to facility to [place].

## 2017-06-10 ENCOUNTER — Inpatient Hospital Stay
Admission: RE | Admit: 2017-06-10 | Discharge: 2017-11-06 | Disposition: A | Discharge status: Home / Self Care | Payer: Medicare Other | Source: Ambulatory Visit | Attending: Internal Medicine | Admitting: Internal Medicine

## 2017-06-10 DIAGNOSIS — D631 Anemia in chronic kidney disease: Secondary | ICD-10-CM | POA: Diagnosis not present

## 2017-06-10 DIAGNOSIS — Z9049 Acquired absence of other specified parts of digestive tract: Secondary | ICD-10-CM | POA: Diagnosis not present

## 2017-06-10 DIAGNOSIS — Z992 Dependence on renal dialysis: Secondary | ICD-10-CM | POA: Diagnosis not present

## 2017-06-10 DIAGNOSIS — Z9181 History of falling: Secondary | ICD-10-CM | POA: Diagnosis not present

## 2017-06-10 DIAGNOSIS — R279 Unspecified lack of coordination: Secondary | ICD-10-CM | POA: Diagnosis not present

## 2017-06-10 DIAGNOSIS — E039 Hypothyroidism, unspecified: Secondary | ICD-10-CM | POA: Diagnosis not present

## 2017-06-10 DIAGNOSIS — N186 End stage renal disease: Secondary | ICD-10-CM | POA: Diagnosis not present

## 2017-06-10 DIAGNOSIS — K219 Gastro-esophageal reflux disease without esophagitis: Secondary | ICD-10-CM | POA: Diagnosis not present

## 2017-06-10 DIAGNOSIS — D509 Iron deficiency anemia, unspecified: Secondary | ICD-10-CM | POA: Diagnosis not present

## 2017-06-10 DIAGNOSIS — D649 Anemia, unspecified: Secondary | ICD-10-CM | POA: Diagnosis not present

## 2017-06-10 DIAGNOSIS — N401 Enlarged prostate with lower urinary tract symptoms: Secondary | ICD-10-CM | POA: Diagnosis not present

## 2017-06-10 DIAGNOSIS — Z79899 Other long term (current) drug therapy: Secondary | ICD-10-CM | POA: Diagnosis not present

## 2017-06-10 DIAGNOSIS — E785 Hyperlipidemia, unspecified: Secondary | ICD-10-CM | POA: Diagnosis not present

## 2017-06-10 DIAGNOSIS — A419 Sepsis, unspecified organism: Secondary | ICD-10-CM | POA: Diagnosis not present

## 2017-06-10 DIAGNOSIS — R5081 Fever presenting with conditions classified elsewhere: Secondary | ICD-10-CM | POA: Diagnosis not present

## 2017-06-10 DIAGNOSIS — N3289 Other specified disorders of bladder: Secondary | ICD-10-CM | POA: Diagnosis not present

## 2017-06-10 DIAGNOSIS — E1129 Type 2 diabetes mellitus with other diabetic kidney complication: Secondary | ICD-10-CM | POA: Diagnosis not present

## 2017-06-10 DIAGNOSIS — E43 Unspecified severe protein-calorie malnutrition: Secondary | ICD-10-CM | POA: Diagnosis not present

## 2017-06-10 DIAGNOSIS — F70 Mild intellectual disabilities: Secondary | ICD-10-CM | POA: Diagnosis not present

## 2017-06-10 DIAGNOSIS — I10 Essential (primary) hypertension: Secondary | ICD-10-CM | POA: Diagnosis not present

## 2017-06-10 DIAGNOSIS — Z23 Encounter for immunization: Secondary | ICD-10-CM | POA: Diagnosis not present

## 2017-06-10 DIAGNOSIS — D638 Anemia in other chronic diseases classified elsewhere: Secondary | ICD-10-CM | POA: Diagnosis not present

## 2017-06-10 DIAGNOSIS — S72421D Displaced fracture of lateral condyle of right femur, subsequent encounter for closed fracture with routine healing: Secondary | ICD-10-CM | POA: Diagnosis not present

## 2017-06-10 DIAGNOSIS — F79 Unspecified intellectual disabilities: Secondary | ICD-10-CM | POA: Diagnosis not present

## 2017-06-10 DIAGNOSIS — E1121 Type 2 diabetes mellitus with diabetic nephropathy: Secondary | ICD-10-CM | POA: Diagnosis not present

## 2017-06-10 DIAGNOSIS — R509 Fever, unspecified: Secondary | ICD-10-CM | POA: Diagnosis not present

## 2017-06-10 DIAGNOSIS — E871 Hypo-osmolality and hyponatremia: Secondary | ICD-10-CM | POA: Diagnosis not present

## 2017-06-10 DIAGNOSIS — Z7901 Long term (current) use of anticoagulants: Secondary | ICD-10-CM | POA: Diagnosis not present

## 2017-06-10 DIAGNOSIS — D500 Iron deficiency anemia secondary to blood loss (chronic): Secondary | ICD-10-CM | POA: Diagnosis not present

## 2017-06-10 DIAGNOSIS — M6281 Muscle weakness (generalized): Secondary | ICD-10-CM | POA: Diagnosis not present

## 2017-06-10 LAB — RENAL FUNCTION PANEL
Albumin: 2.1 g/dL — ABNORMAL LOW (ref 3.5–5.0)
Anion gap: 11 (ref 5–15)
BUN: 34 mg/dL — ABNORMAL HIGH (ref 6–20)
CO2: 26 mmol/L (ref 22–32)
Calcium: 8.4 mg/dL — ABNORMAL LOW (ref 8.9–10.3)
Chloride: 100 mmol/L — ABNORMAL LOW (ref 101–111)
Creatinine, Ser: 4.96 mg/dL — ABNORMAL HIGH (ref 0.61–1.24)
GFR calc Af Amer: 13 mL/min — ABNORMAL LOW (ref >60)
GFR calc non Af Amer: 11 mL/min — ABNORMAL LOW (ref >60)
Glucose, Bld: 146 mg/dL — ABNORMAL HIGH (ref 65–99)
Phosphorus: 3.7 mg/dL (ref 2.5–4.6)
Potassium: 3.2 mmol/L — ABNORMAL LOW (ref 3.5–5.1)
Sodium: 137 mmol/L (ref 135–145)

## 2017-06-10 LAB — TYPE AND SCREEN
ABO/RH(D): B POS
Antibody Screen: NEGATIVE
Unit division: 0
Unit division: 0

## 2017-06-10 LAB — BPAM RBC
Blood Product Expiration Date: 201808312359
Blood Product Expiration Date: 201809042359
ISSUE DATE / TIME: 201808141401
ISSUE DATE / TIME: 201808142300
Unit Type and Rh: 1700
Unit Type and Rh: 1700

## 2017-06-10 LAB — CBC
HCT: 28.9 % — ABNORMAL LOW (ref 39.0–52.0)
Hemoglobin: 9.3 g/dL — ABNORMAL LOW (ref 13.0–17.0)
MCH: 29.2 pg (ref 26.0–34.0)
MCHC: 32.2 g/dL (ref 30.0–36.0)
MCV: 90.9 fL (ref 78.0–100.0)
Platelets: 323 10*3/uL (ref 150–400)
RBC: 3.18 MIL/uL — ABNORMAL LOW (ref 4.22–5.81)
RDW: 17.7 % — ABNORMAL HIGH (ref 11.5–15.5)
WBC: 8.1 10*3/uL (ref 4.0–10.5)

## 2017-06-10 LAB — GLUCOSE, CAPILLARY
Glucose-Capillary: 106 mg/dL — ABNORMAL HIGH (ref 65–99)
Glucose-Capillary: 123 mg/dL — ABNORMAL HIGH (ref 65–99)

## 2017-06-10 MED ORDER — SODIUM CHLORIDE 0.9 % IV SOLN: 100.0000 mL | INTRAVENOUS | Status: DC | PRN

## 2017-06-10 MED ORDER — AMOXICILLIN-POT CLAVULANATE 500-125 MG PO TABS: 1.0000 | ORAL_TABLET | Freq: Two times a day (BID) | ORAL | 0 refills | Status: DC

## 2017-06-10 MED ORDER — NEPRO/CARBSTEADY PO LIQD
237.0000 mL | Freq: Three times a day (TID) | ORAL | Status: DC
  Administered 2017-06-10: 237 mL via ORAL

## 2017-06-10 NOTE — Progress Notes (Signed)
Zachary [name]  MRN: [id number]  DOB/AGE: 03-21-1946 71 y.o.  Primary Care Physician:Fanta, Tesfaye, MD  Admit date: 06/02/2017  Chief Complaint:  Chief Complaint  Patient presents with  . Fever    S-Pt presented on  06/02/2017 with  Chief Complaint  Patient presents with  . Fever  .    Pt offers no new complaints.   Meds . atorvastatin  40 mg Oral QPM  . epoetin (EPOGEN/PROCRIT) injection  10,000 Units Intravenous Q M,W,F-HD  . feeding supplement (NEPRO CARB STEADY)  237 mL Oral TID BM  . insulin aspart  0-9 Units Subcutaneous TID WC  . levothyroxine  75 mcg Oral QAC breakfast  . multivitamin with minerals  1 tablet Oral QPM  . pantoprazole  40 mg Oral Daily  . sevelamer carbonate  1,600 mg Oral TID WC  . sevelamer carbonate  800 mg Oral With snacks  . sodium chloride flush  3 mL Intravenous Q12H  . tamsulosin  0.4 mg Oral Q2000     Physical Exam: Vital signs in last 24 hours: Temp:  [97.7 F (36.5 C)-99.8 F (37.7 C)] 99 F (37.2 C) (08/15 0645) Pulse Rate:  [84-110] 96 (08/15 0830) Resp:  [20] 20 (08/15 0645) BP: (95-125)/(53-65) 111/64 (08/15 0830) SpO2:  [99 %-100 %] 100 % (08/15 0645) Weight:  [172 lb 13.5 oz (78.4 kg)] 172 lb 13.5 oz (78.4 kg) (08/15 0645) Weight change: -3 lb 1.4 oz (-1.4 kg) Last BM Date: 06/08/17  Intake/Output from previous day: 08/14 0701 - 08/15 0700 In: 870 [P.O.:240; Blood:630] Out: -  No intake/output data recorded.   Physical Exam: General- pt is awake, follow commands Resp- No acute REsp distress, NO Rhonchi CVS- S1S2 regular in rate and rhythm GIT- BS  present+,soft ,Distended+ EXT- NO LE Edema, NO Cyanosis Access- AVF +  Lab Results: CBC  Recent Labs  06/08/17 0518 06/10/17 0727  WBC 7.1 8.1  HGB 7.3* 9.3*  HCT 22.6* 28.9*  PLT 315 323    BMET  Recent Labs  06/08/17 0519 06/10/17 0727  NA 134* 137  K 3.5 3.2*  CL 97* 100*  CO2 27 26  GLUCOSE 96 146*  BUN 50* 34*  CREATININE 5.68* 4.96*   CALCIUM 8.0* 8.4*     MICRO Recent Results (from the past 240 hour(s))  Blood Culture (routine x 2)     Status: None   Collection Time: 06/03/17 12:55 AM  Result Value Ref Range Status   Specimen Description BLOOD LEFT HAND INDEX FINGER  Final   Special Requests   Final    BOTTLES DRAWN AEROBIC AND ANAEROBIC Blood Culture results may not be optimal due to an inadequate volume of blood received in culture bottles   Culture NO GROWTH 5 DAYS  Final   Report Status 06/08/2017 FINAL  Final  Blood Culture (routine x 2)     Status: None   Collection Time: 06/03/17  1:14 AM  Result Value Ref Range Status   Specimen Description BLOOD LEFT HAND THUMB  Final   Special Requests   Final    BOTTLES DRAWN AEROBIC AND ANAEROBIC Blood Culture results may not be optimal due to an inadequate volume of blood received in culture bottles   Culture NO GROWTH 5 DAYS  Final   Report Status 06/08/2017 FINAL  Final  MRSA PCR Screening     Status: Abnormal   Collection Time: 06/03/17  4:06 AM  Result Value Ref Range Status   MRSA by PCR POSITIVE (  A) NEGATIVE Final    Comment:        The GeneXpert MRSA Assay (FDA approved for NASAL specimens only), is one component of a comprehensive MRSA colonization surveillance program. It is not intended to diagnose MRSA infection nor to guide or monitor treatment for MRSA infections. RESULT CALLED TO, READ BACK BY AND VERIFIED WITH: MAYS,J AT 0645 BY HUFFINES,S ON 06/03/17.   C difficile quick scan w PCR reflex     Status: None   Collection Time: 06/05/17  3:50 AM  Result Value Ref Range Status   C Diff antigen NEGATIVE NEGATIVE Final   C Diff toxin NEGATIVE NEGATIVE Final   C Diff interpretation No C. difficile detected.  Final    Comment: VALID      Lab Results  Component Value Date   PTH 61 10/09/2015   CALCIUM 8.4 (L) 06/10/2017   CAION 1.24 10/08/2015   PHOS 3.7 06/10/2017                Impression: 1)Renal  ESRD on HD                 Pt is on TTS schedule as outpt                Pt will be dialyzed  Today.  2)HTN  BP stable  3)Anemia IN ESRD the goal for HGB is 9-11. hgb not at goal Pt reecived PRBC during this admsion on EPO during hd.   4)CKD Mineral-Bone Disorder Calcium when corrected for low albumin is at goal Phosphorus now  at goal.    5)CNS-Hx of mental Retardation  PMD following  6)Electrolytes  Hyokalemic  better  Hyponatremia Sec to ESRD   7)Acid base Co2  at goal.  8) GI-pt is s/p total colectomy Clinically better Sx following   Plan:   Will continue current tx  Addendum. Pt seen on HD. Pt tolerating tx well.      [name] S 06/10/2017, 8:58 AM

## 2017-06-10 NOTE — Discharge Summary (Signed)
Physician Discharge Summary  Patient ID: Zachary [name] MRN: [id number] DOB/AGE: Apr 29, 1946 71 y.o. Primary Care Physician:Elissia Spiewak, MD Admit date: 06/02/2017 Discharge date: 06/10/2017    Discharge Diagnoses:   Principal Problem:   Fever Active Problems:   Mental retardation   Acute blood loss anemia   ESRD (end stage renal disease) ([place])   Anemia in CKD (chronic kidney disease)   Allergies as of 06/10/2017   No Known Allergies     Medication List    STOP taking these medications   enoxaparin 100 MG/ML injection Commonly known as:  LOVENOX     TAKE these medications   amoxicillin-clavulanate 500-125 MG tablet Commonly known as:  AUGMENTIN Take 1 tablet (500 mg total) by mouth 2 (two) times daily.   atorvastatin 40 MG tablet Commonly known as:  LIPITOR Take 40 mg by mouth every evening.   feeding supplement (PRO-STAT 64) Liqd Take 30 mLs by mouth 2 (two) times daily between meals.   ferrous sulfate 325 (65 FE) MG tablet Take 325 mg by mouth daily with breakfast.   FIRST AID PLUS LIDOCAINE EX Apply 1 application topically Every Tuesday,Thursday,and Saturday with dialysis. Prior to dialysis   levothyroxine 75 MCG tablet Commonly known as:  SYNTHROID, LEVOTHROID Take 75 mcg by mouth daily before breakfast.   multivitamin with minerals Tabs tablet Take 1 tablet by mouth every evening.   NEPRO/CARBSTEADY PO Take 1 Can by mouth 2 (two) times daily.   omeprazole 40 MG capsule Commonly known as:  PRILOSEC Take 40 mg by mouth every evening.   tamsulosin 0.4 MG Caps capsule Commonly known as:  FLOMAX Take 0.4 mg by mouth daily at 8 pm. Give 30 minutes after a meal   torsemide 10 MG tablet Commonly known as:  DEMADEX Take 5 tablets (50 mg total) by mouth daily.       Discharged Condition:  improved    Consults: nephrology  Significant Diagnostic Studies: Ct Abdomen Pelvis Wo Contrast  Result Date: 05/17/2017 CLINICAL DATA:  Colectomy May 04, 2017. Diarrhea, fever, and vomiting today. No pain. EXAM: CT ABDOMEN AND PELVIS WITHOUT CONTRAST TECHNIQUE: Multidetector CT imaging of the abdomen and pelvis was performed following the standard protocol without IV contrast. COMPARISON:  May 09, 2017 FINDINGS: Lower chest: Coronary artery calcifications are identified. Dependent atelectasis is seen bilaterally without suspicious infiltrate, nodule, or mass. Hepatobiliary: No focal liver abnormality is seen. No gallstones, gallbladder wall thickening, or biliary dilatation. Pancreas: Unremarkable. No pancreatic ductal dilatation or surrounding inflammatory changes. Spleen: Normal in size without focal abnormality. Adrenals/Urinary Tract: Adrenal glands are normal. A left renal cyst is identified. There is a calcification in the wall of the left renal cyst. No renal obstruction. The bladder is thick walled and poorly distended. Stomach/Bowel: The patient is status post colectomy with an ilealcolic anastomosis in the pelvis. Mild gastric distention persists but is improved in the interval. The proximal small bowel is relatively decompressed. Small bowel loops in the left lateral abdomen on series 2, image 43 may be thick walled but are poorly distended or evaluated without oral contrast. No pneumatosis. More distally, the small bowel is distended measuring up to 4 cm in caliber. There appears to be rectal wall thickening distal to the anastomosis, best appreciated on coronal image 79. The rectum is poorly distended however somewhat limiting evaluation. Mild thickening of the distal ileum, just proximal to the anastomosis persists is unchanged on axial image 77. Vascular/Lymphatic: Calcified atherosclerosis is associated with the non aneurysmal  aorta and iliac vessels. No adenopathy. Reproductive: Prostate is unremarkable. Other: Subcutaneous gas is likely from recent injections or recent surgery. No associated fluid collection or soft tissue stranding. No free air  identified. Ascites remains, particularly in the upper abdomen adjacent to the liver and spleen. Musculoskeletal: No acute bony abnormalities. IMPRESSION: 1. There appears to be wall thickening associated with the distal ileum, just proximal to the anastomosis in the pelvis. There also appears to be wall thickening associated with the rectum. A few loops of jejunum in in the lateral left abdomen may demonstrate mild wall thickening as well. These regions of wall thickening suggest an inflammatory or infectious process. The proximal small bowel is decompressed although the distal half of the small bowel is mildly dilated without a discrete site of obstruction identified. 2. Ascites.  Free air has resolved. 3. Atherosclerosis. Electronically Signed   By: Dorise Bullion III M.D   On: 05/17/2017 17:42   Dg Chest 1 View  Result Date: 05/17/2017 CLINICAL DATA:  Fever.  Sepsis. EXAM: CHEST 1 VIEW COMPARISON:  December 26, 2016 FINDINGS: The study is limited due to a low volume portable technique. No pneumothorax. No nodules or masses. Increased density in the right infrahilar region could represent vascular crowding but a superimposed subtle infiltrate is not excluded. A PA and lateral chest x-ray may better evaluate. No other acute abnormalities are identified. IMPRESSION: 1. Increased density in the right infrahilar region could represent vascular crowding versus a subtle superimposed infiltrate. A PA and lateral chest x-ray may better evaluate. Electronically Signed   By: Dorise Bullion III M.D   On: 05/17/2017 17:01   Dg Chest 2 View  Result Date: 05/17/2017 CLINICAL DATA:  Fever.  Sepsis. EXAM: CHEST  2 VIEW COMPARISON:  Single AP view earlier this day. Lung bases from abdominal CT earlier this day. FINDINGS: Low lung volumes. Bibasilar atelectasis, including the right infrahilar opacity seen on prior exam. No confluent consolidation. Normal heart size and mediastinal contours. No pulmonary edema. No  pneumothorax or pleural effusion. No acute osseous abnormalities. IMPRESSION: Bibasilar atelectasis, including right infrahilar opacity on prior radiograph. No consolidation to suggest pneumonia. Low lung volumes persist. Electronically Signed   By: Jeb Levering M.D.   On: 05/17/2017 18:15   Dg Chest Port 1 View  Result Date: 06/03/2017 CLINICAL DATA:  Acute onset of fever and tachycardia. Initial encounter. EXAM: PORTABLE CHEST 1 VIEW COMPARISON:  Chest radiograph performed 05/17/2017 FINDINGS: The lungs are well-aerated. Mild vascular congestion is noted. There is no evidence of focal opacification, pleural effusion or pneumothorax. The cardiomediastinal silhouette is within normal limits. No acute osseous abnormalities are seen. IMPRESSION: Mild vascular congestion noted.  Lungs remain grossly clear. Electronically Signed   By: Garald Balding M.D.   On: 06/03/2017 00:51    Lab Results: Basic Metabolic Panel:  Recent Labs  06/08/17 0519 06/10/17 0727  NA 134* 137  K 3.5 3.2*  CL 97* 100*  CO2 27 26  GLUCOSE 96 146*  BUN 50* 34*  CREATININE 5.68* 4.96*  CALCIUM 8.0* 8.4*  PHOS 5.1* 3.7   Liver Function Tests:  Recent Labs  06/08/17 0519 06/10/17 0727  ALBUMIN 2.0* 2.1*     CBC:  Recent Labs  06/08/17 0518 06/10/17 0727  WBC 7.1 8.1  HGB 7.3* 9.3*  HCT 22.6* 28.9*  MCV 93.0 90.9  PLT 315 323    Recent Results (from the past 240 hour(s))  Blood Culture (routine x 2)  Status: None   Collection Time: 06/03/17 12:55 AM  Result Value Ref Range Status   Specimen Description BLOOD LEFT HAND INDEX FINGER  Final   Special Requests   Final    BOTTLES DRAWN AEROBIC AND ANAEROBIC Blood Culture results may not be optimal due to an inadequate volume of blood received in culture bottles   Culture NO GROWTH 5 DAYS  Final   Report Status 06/08/2017 FINAL  Final  Blood Culture (routine x 2)     Status: None   Collection Time: 06/03/17  1:14 AM  Result Value Ref Range  Status   Specimen Description BLOOD LEFT HAND THUMB  Final   Special Requests   Final    BOTTLES DRAWN AEROBIC AND ANAEROBIC Blood Culture results may not be optimal due to an inadequate volume of blood received in culture bottles   Culture NO GROWTH 5 DAYS  Final   Report Status 06/08/2017 FINAL  Final  MRSA PCR Screening     Status: Abnormal   Collection Time: 06/03/17  4:06 AM  Result Value Ref Range Status   MRSA by PCR POSITIVE (A) NEGATIVE Final    Comment:        The GeneXpert MRSA Assay (FDA approved for NASAL specimens only), is one component of a comprehensive MRSA colonization surveillance program. It is not intended to diagnose MRSA infection nor to guide or monitor treatment for MRSA infections. RESULT CALLED TO, READ BACK BY AND VERIFIED WITH: MAYS,J AT 0645 BY HUFFINES,S ON 06/03/17.   C difficile quick scan w PCR reflex     Status: None   Collection Time: 06/05/17  3:50 AM  Result Value Ref Range Status   C Diff antigen NEGATIVE NEGATIVE Final   C Diff toxin NEGATIVE NEGATIVE Final   C Diff interpretation No C. difficile detected.  Final    Comment: VALID     Hospital Course:   This is a 71 years old male with history of multiple medical illnesses was admitted due to fever as case of sepsis. He was empirically started on combinations of antibiotics.  His final blood culture showed no growth. Patient clinically improved and his fever subsided. Patient was transfused 2 units of PRBC for severe anemia. He received his maintenance hemodialysis. His Lovenox was disconinued because he received more than 4 months treatment for DVT. [facility].  Discharge Exam: Blood pressure (!) 125/58, pulse 88, temperature 99 F (37.2 C), temperature source Oral, resp. rate 20, height 6' (1.829 m), weight 78.4 kg (172 lb 13.5 oz), SpO2 100 %.    Disposition:  Skilled nursing home.    Contact information for after-discharge care     [facility] .   Specialty:  Skilled Nursing Facility Contact information: [address] [place] [phone]              Signed: [name]   06/10/2017, 8:21 AM

## 2017-06-10 NOTE — Care Management Important Message (Signed)
Important Message  Patient Details  Name: Zachary [name] MRN: [id number] Date of Birth: 21-May-1946   Medicare Important Message Given:  Yes    Sherald Barge, RN 06/10/2017, 11:06 AM

## 2017-06-10 NOTE — Progress Notes (Signed)
Initial Nutrition Assessment  DOCUMENTATION CODES:   Severe malnutrition in context of chronic illness; ongoing  INTERVENTION:  Nepro Shake po TID, each supplement provides 425 kcal and 19 grams protein   Recommend change MVI to Renal specific vitamin   NUTRITION DIAGNOSIS:   Malnutrition ; ongoing acute on chronic related to poor appetite as evidenced by energy intake < or equal to 50% for > or equal to 5 days.   GOAL:   Patient will meet greater than or equal to 90% of their needs  MONITOR:   PO intake, Weight trends, Labs, Supplement acceptance  REASON FOR ASSESSMENT:   Consult Assessment of nutrition requirement/status  ASSESSMENT:  Patient known to RD from [facility]. He presented to [facility] on 8/7 with a fever. He is s/p colectomy in July and had a fistula bleed prior to admission for which he was treated in the ED and returned to SNF. He has a hx of ESRD on diaylsis, anemia, DM 2, MR, GERD and lower extremity edema.  His appetite and po intake have been poor <75% of est needs since his hospitalization in July. His po intake during this admission has been averaging mostly 0-50% of meals.  Physical exam findings: mild-moderate fat depletion,  Moderate muscle depletion, and mild edema.   His weight recently ranging 78-80 kg. It was 78 kg in January. He it to receive HD treatment today.   Recent Labs Lab 06/05/17 0726 06/07/17 0800 06/08/17 0519  NA 134* 135 134*  K 3.3* 3.1* 3.5  CL 97* 97* 97*  CO2 25 27 27   BUN 46* 43* 50*  CREATININE 5.13* 5.53* 5.68*  CALCIUM 7.4* 7.9* 8.0*  PHOS 6.7* 5.3* 5.1*  GLUCOSE 95 100* 96   Labs: BUN 50, Cr 5.68 Phos 5.1, sodium 134  Meds: Epogen, SSI, MVI, Renvela  Diet Order:  Diet heart healthy/carb modified Room service appropriate? Yes; Fluid consistency: Thin  Skin:  Reviewed, no issues  Last BM:  8/13 liquid stool  Height:   Ht Readings from Last 1 Encounters:  06/03/17 6' (1.829 m)    Weight:   Wt Readings from Last  1 Encounters:  06/10/17 172 lb 13.5 oz (78.4 kg)    Ideal Body Weight:  81 kg  BMI:  Body mass index is 23.44 kg/m.  Estimated Nutritional Needs:   Kcal:  2200-2400  Protein:  93-101 gr  Fluid:  1200 ml daily  EDUCATION NEEDS:   No education needs identified at this time  Colman Cater MS,RD,CSG,LDN Office: #[phone] Pager: [phone]

## 2017-06-10 NOTE — Progress Notes (Signed)
Report called and given to [facility], nurse verbalized understanding. Patient taken over safely to his room.

## 2017-06-10 NOTE — Clinical Social Work Note (Signed)
LCSW sent discharge clinicals via epic Hub. LCSW left messages for Kerri at [facility] and [name] at [facility] (legal guardian) advising of patient's discharge.    LCSW signing off.     Ishana Blades, Clydene Pugh, LCSW

## 2017-06-11 ENCOUNTER — Non-Acute Institutional Stay (SKILLED_NURSING_FACILITY): Payer: Medicare Other | Admitting: Internal Medicine

## 2017-06-11 ENCOUNTER — Encounter: Payer: Self-pay | Admitting: Internal Medicine

## 2017-06-11 DIAGNOSIS — R509 Fever, unspecified: Secondary | ICD-10-CM | POA: Diagnosis not present

## 2017-06-11 DIAGNOSIS — D509 Iron deficiency anemia, unspecified: Secondary | ICD-10-CM | POA: Diagnosis not present

## 2017-06-11 DIAGNOSIS — Z992 Dependence on renal dialysis: Secondary | ICD-10-CM | POA: Diagnosis not present

## 2017-06-11 DIAGNOSIS — E1121 Type 2 diabetes mellitus with diabetic nephropathy: Secondary | ICD-10-CM | POA: Diagnosis not present

## 2017-06-11 DIAGNOSIS — N186 End stage renal disease: Secondary | ICD-10-CM

## 2017-06-11 DIAGNOSIS — D638 Anemia in other chronic diseases classified elsewhere: Secondary | ICD-10-CM

## 2017-06-11 DIAGNOSIS — D631 Anemia in chronic kidney disease: Secondary | ICD-10-CM | POA: Diagnosis not present

## 2017-06-11 NOTE — Progress Notes (Signed)
Provider:Kanyla Omeara,Cobi Aldape   Location:   [facility] Room Number: 112/D Place of Service:  SNF (31)  PCP: [name], MD Patient Care Team: [name], MD as PCP - General (Internal Medicine) Cassandria Anger, MD as Consulting Physician (Endocrinology) Fran Lowes, MD as Consulting Physician (Nephrology) Gala Romney Cristopher Estimable, MD as Consulting Physician (Gastroenterology)  Extended Emergency Contact Information Primary Emergency Contact: Betsey Amen Address: DSS  [place] Phone: [phone] Work Phone: [phone] Relation: Legal Guardian Secondary Emergency Contact: Hazelbaker,Richard Address: [address]          [place], [place] [address] [place] Phone: [phone] Relation: Brother  Code Status: Full Code Goals of Care: Advanced Directive information Advanced Directives 06/11/2017  Does Patient Have a Medical Advance Directive? Yes  Type of Advance Directive (No Data)  Does patient want to make changes to medical advance directive? No - Patient declined  Copy of [place] in Chart? -  Would patient like information on creating a medical advance directive? No - Patient declined  Pre-existing out of facility DNR order (yellow form or pink MOST form) -      Chief Complaint  Patient presents with  . Readmit To SNF    From Hospitalization stay    HPI: Patient is a 71 y.o. male seen today for admission to SNF for Long term care  Patient ahas h/o ESRD on dialysis, Type 2 Diabetes, Hypothyroidism, anemia and Mild Mental delay. 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 He was admitted to the hospital with presumed Sepsis on 07/22-07/26 and was treated with Antibiotics. His CT scan did show Inflammation of small bowel. He was discharged to the facility. In here he again spiked fever and was  tachycardic. He was again admitted to the Hospital. He was treated empirically with Antibiotics.. He also was transfused 2 units of PRBC for anemia. His Blood cultures were negative and his Chest Xray wsa negative. No Abdomen CT was done this time. His Lovenox was stopped as he had finished duration treatment for DVT.  Patient was seen today after Dialysis for readmission to facility. He says he still feels nauseated and not very hungry. He denies any Vomiting. No fever or chills. No dysuria. No Coughing or Chest pain. Patient weighs 172 lbs which is down from his weight of 188 lbs few weeks ago.   Past Medical History:  Diagnosis Date  . Abnormal CT scan, kidney 10/06/2011  . Acute pyelonephritis 10/07/2011  . Anemia    normocytic  . Anxiety    mental retardation  . Bladder wall thickening 10/06/2011  . BPH (benign prostatic hypertrophy)   . Diabetes mellitus   . Dialysis patient [facility])    Tuesday, Thursday and Saturday,   . Edema     history of lower extremity edema  . GERD (gastroesophageal reflux disease)   . Heme positive stool   . Hydronephrosis   . Hyperkalemia   . Hyperlipidemia   . Hypernatremia   . Hypertension   . Hypothyroidism   . Impaired speech   . 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 MR (mental retardation)   . Muscle weakness   . Obstructive uropathy   . 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 Poor historian poor historian  . Protein calorie malnutrition ([place])   . Pyelonephritis   . Renal failure (ARF), acute on chronic (HCC)   .  Renal insufficiency    chronic history  . Sepsis ([place])   . Smoking   . Uremia   . Urinary retention   . UTI (lower urinary tract infection) 10/06/2011   Past Surgical History:  Procedure Laterality Date  . AV FISTULA PLACEMENT Left 07/06/2015   Procedure:  INSERTION LEFT ARM ARTERIOVENOUS GORTEX GRAFT;  Surgeon: Angelia Mould, MD;  Location: [facility];  Service: Vascular;  Laterality: Left;  . AV FISTULA PLACEMENT  Right 02/26/2016   Procedure: ARTERIOVENOUS (AV) FISTULA CREATION ;  Surgeon: Angelia Mould, MD;  Location: [facility];  Service: Vascular;  Laterality: Right;  . [place] REMOVAL Left 10/09/2015   Procedure: REMOVAL OF ARTERIOVENOUS GORETEX GRAFT ([place]) Evacuation of Lymphocele, Vein Patch angioplasty of brachial artery.;  Surgeon: Angelia Mould, MD;  Location: [facility];  Service: Vascular;  Laterality: Left;  . BASCILIC VEIN TRANSPOSITION Right 02/26/2016   Procedure: Right BASCILIC VEIN TRANSPOSITION;  Surgeon: Angelia Mould, MD;  Location: [facility];  Service: Vascular;  Laterality: Right;  . CIRCUMCISION N/A 01/04/2014   Procedure: CIRCUMCISION ADULT (procedure #1);  Surgeon: Marissa Nestle, MD;  Location: [facility];  Service: Urology;  Laterality: N/A;  . COLECTOMY N/A 05/04/2017   Procedure: TOTAL COLECTOMY;  Surgeon: Aviva Signs, MD;  Location: [facility];  Service: General;  Laterality: N/A;  . COLONOSCOPY N/A 04/27/2017   Procedure: COLONOSCOPY;  Surgeon: Daneil Dolin, MD;  [facility];  Service: Endoscopy;  Laterality: N/A;  245  . CYSTOSCOPY W/ RETROGRADES Bilateral 06/29/2015   Procedure: CYSTOSCOPY, DILATION OF URETHRAL STRICTURE WITH BILATERAL RETROGRADE PYELOGRAM,SUPRAPUBIC TUBE CHANGE;  Surgeon: Festus Aloe, MD;  Location: [facility];  Service: Urology;  Laterality: Bilateral;  . CYSTOSCOPY WITH URETHRAL DILATATION N/A 12/29/2013   Procedure: CYSTOSCOPY WITH URETHRAL DILATATION;  Surgeon: Marissa Nestle, MD;  Location[facility];  Service: Urology;  Laterality: N/A;  . ESOPHAGOGASTRODUODENOSCOPY N/A 04/27/2017   Procedure: ESOPHAGOGASTRODUODENOSCOPY (EGD);  Surgeon: Daneil Dolin, MD;  [facility];  Service: Endoscopy;  Laterality: N/A;  . ORIF FEMUR FRACTURE Right 11/22/2016   Procedure: OPEN REDUCTION INTERNAL FIXATION (ORIF) DISTAL FEMUR FRACTURE;  Surgeon: Rod Can, MD;  Location: [facility];  Service: Orthopedics;  Laterality: Right;  . PERIPHERAL  VASCULAR CATHETERIZATION N/A 10/08/2015   Procedure: A/V Shuntogram;  Surgeon: Angelia Mould, MD;  Location: [facility];  Service: Cardiovascular;  Laterality: N/A;  . TRANSURETHRAL RESECTION OF PROSTATE N/A 01/04/2014   Procedure: TRANSURETHRAL RESECTION OF THE PROSTATE (TURP) (procedure #2);  Surgeon: Marissa Nestle, MD;  [facility];  Service: Urology;  Laterality: N/A;    reports that he has never smoked. He has never used smokeless tobacco. He reports that he does not drink alcohol or use drugs. Social History   Social History  . Marital status: Single    Spouse name: N/A  . Number of children: N/A  . Years of education: N/A   Occupational History  . Not on file.   Social History Main Topics  . Smoking status: Never Smoker  . Smokeless tobacco: Never Used  . Alcohol use No     Comment: occ. use   . Drug use: No  . Sexual activity: No   Other Topics Concern  . Not on file   Social History Narrative   Lives at nursing home.    Functional Status Survey:    Family History  Problem Relation Age of Onset  . Cancer Mother   .  Colon cancer Neg Hx     Health Maintenance  Topic Date Due  . FOOT EXAM  08/23/1958  . OPHTHALMOLOGY EXAM  08/23/1958  . URINE MICROALBUMIN  08/23/1958  . INFLUENZA VACCINE  09/26/2017 (Originally 05/27/2017)  . PNA vac Low Risk Adult (2 of 2 - PCV13) 09/26/2017 (Originally 03/03/2016)  . TETANUS/TDAP  10/27/2017 (Originally 08/24/1967)  . HEMOGLOBIN A1C  11/28/2017  . COLONOSCOPY  04/28/2027  . Hepatitis C Screening  Completed    No Known Allergies  Allergies as of 06/11/2017   No Known Allergies     Medication List       Accurate as of 06/11/17  9:51 AM. Always use your most recent med list.          amoxicillin-clavulanate 500-125 MG tablet Commonly known as:  AUGMENTIN Take 1 tablet (500 mg total) by mouth 2 (two) times daily.   atorvastatin 40 MG tablet Commonly known as:  LIPITOR Take 40 mg by mouth  every evening.   feeding supplement (PRO-STAT 64) Liqd Take 30 mLs by mouth 2 (two) times daily between meals.   ferrous sulfate 325 (65 FE) MG tablet Take 325 mg by mouth daily with breakfast.   FIRST AID PLUS LIDOCAINE EX Apply 1 application topically Every Tuesday,Thursday,and Saturday with dialysis. Prior to dialysis   levothyroxine 75 MCG tablet Commonly known as:  SYNTHROID, LEVOTHROID Take 75 mcg by mouth daily before breakfast.   multivitamin with minerals Tabs tablet Take 1 tablet by mouth every evening.   NEPRO/CARBSTEADY PO Take 1 Can by mouth 2 (two) times daily.   omeprazole 40 MG capsule Commonly known as:  PRILOSEC Take 40 mg by mouth every evening.   tamsulosin 0.4 MG Caps capsule Commonly known as:  FLOMAX Take 0.4 mg by mouth daily at 8 pm. Give 30 minutes after a meal   torsemide 10 MG tablet Commonly known as:  DEMADEX Take 5 tablets (50 mg total) by mouth daily.       Review of Systems  Constitutional: Positive for appetite change and fatigue. Negative for chills and fever.  HENT: Negative.   Respiratory: Negative.   Cardiovascular: Negative.   Gastrointestinal: Positive for abdominal distention and nausea. Negative for blood in stool, constipation and diarrhea.  Genitourinary: Negative for dysuria and frequency.  Musculoskeletal: Negative.   Skin: Negative.   Neurological: Negative for dizziness, weakness and light-headedness.  Psychiatric/Behavioral: Negative.       Vitals:   06/11/17 0951  BP: (!) 110/57  Pulse: (!) 110  Resp: 16  Temp: 99.6 F (37.6 C) repeat was 99.0  TempSrc: Oral   There is no height or weight on file to calculate BMI. Physical Exam  Constitutional: He is oriented to person, place, and time. He appears well-developed and well-nourished.  HENT:  Head: Normocephalic.  Mouth/Throat: Oropharynx is clear and moist.  Eyes: Pupils are equal, round, and reactive to light.  Neck: Neck supple.  Cardiovascular:  Normal rate and regular rhythm.   No murmur heard. Pulmonary/Chest: Effort normal and breath sounds normal. No respiratory distress. He has no wheezes. He has no rales. He exhibits no tenderness.  Abdominal: Soft. Bowel sounds are normal. He exhibits no distension. There is no tenderness. There is no rebound.  Musculoskeletal: He exhibits no edema.  Neurological: He is alert and oriented to person, place, and time.  Skin: Skin is warm and dry.  Psychiatric: He has a normal mood and affect. His behavior is normal. Thought content normal.  Labs reviewed: Basic Metabolic Panel:  Recent Labs  06/07/17 0800 06/08/17 0519 06/10/17 0727  NA 135 134* 137  K 3.1* 3.5 3.2*  CL 97* 97* 100*  CO2 27 27 26   GLUCOSE 100* 96 146*  BUN 43* 50* 34*  CREATININE 5.53* 5.68* 4.96*  CALCIUM 7.9* 8.0* 8.4*  PHOS 5.3* 5.1* 3.7   Liver Function Tests:  Recent Labs  05/17/17 1558 05/18/17 1026  06/03/17 0055  06/07/17 0800 06/08/17 0519 06/10/17 0727  AST 36 29  --  16  --   --   --   --   ALT 17 15*  --  15*  --   --   --   --   ALKPHOS 102 92  --  87  --   --   --   --   BILITOT 1.0 0.9  --  0.5  --   --   --   --   PROT 5.9* 5.3*  --  6.5  --   --   --   --   ALBUMIN 2.3* 2.0*  < > 2.4*  < > 2.0* 2.0* 2.1*  < > = values in this interval not displayed.  Recent Labs  11/28/16 1400  LIPASE 65*  AMYLASE 189*   No results for input(s): AMMONIA in the last 8760 hours. CBC:  Recent Labs  05/26/17 0701 05/30/17 2323 06/03/17 0055  06/07/17 0800 06/08/17 0518 06/10/17 0727  WBC 6.9 8.4 6.6  < > 7.9 7.1 8.1  NEUTROABS 4.2 5.3 4.0  --   --   --   --   HGB 7.6* 8.9* 5.8*  < > 7.3* 7.3* 9.3*  HCT 24.3* 28.6* 18.4*  < > 22.4* 22.6* 28.9*  MCV 96.8 99.0 95.8  < > 92.9 93.0 90.9  PLT 351 376 280  < > 296 315 323  < > = values in this interval not displayed. Cardiac Enzymes: No results for input(s): CKTOTAL, CKMB, CKMBINDEX, TROPONINI in the last 8760 hours. BNP: Invalid input(s):  POCBNP Lab Results  Component Value Date   HGBA1C 5.4 05/28/2017   Lab Results  Component Value Date   TSH 3.527 05/26/2017   Lab Results  Component Value Date   VITAMINB12 533 05/01/2017   Lab Results  Component Value Date   FOLATE 56.7 05/01/2017   Lab Results  Component Value Date   IRON 34 (L) 05/01/2017   TIBC 231 (L) 05/01/2017   FERRITIN 85 05/01/2017    Imaging and Procedures obtained prior to SNF admission: Dg Chest Port 1 View  Result Date: 06/03/2017 CLINICAL DATA:  Acute onset of fever and tachycardia. Initial encounter. EXAM: PORTABLE CHEST 1 VIEW COMPARISON:  Chest radiograph performed 05/17/2017 FINDINGS: The lungs are well-aerated. Mild vascular congestion is noted. There is no evidence of focal opacification, pleural effusion or pneumothorax. The cardiomediastinal silhouette is within normal limits. No acute osseous abnormalities are seen. IMPRESSION: Mild vascular congestion noted.  Lungs remain grossly clear. Electronically Signed   By: Garald Balding M.D.   On: 06/03/2017 00:51    Assessment/Plan Fever, unspecified fever cause Patient continues to have low grade fever Will check UA as don't see it was checked. Patient on Augmentin for 5 more days If his fever spikes again consider CT scan of Abdomen  Anemia His Hgb was 7.3 and he was transfused in the hospital Continue Iron supplement And prilosec S/P Colectomy Patient has lost weight and still appetite not good Will continue to monitor.[place]  is well healed  End stage renal disease  Patient on Chronic Dialysis.TTS Follows with Nephrology He is also on Demadex  Type 2 diabetes mellitus  His BS was more then 200 this am Usually it has been well controlled. His Tradjenta was discontinued and he is not on anything his A1C was 5.4 in 08/18 .Continue Accu check for now    Essential hypertension His BP has been low many times in the past. His EF in 2016 was normal. His Coreg was  stopped.  Mixed hyperlipidemia LDL was 50 in 03/18 Continue Atorvastatin.  Closed fracture of neck of right femur S/P ORIF Follow with Ortho  Hypothyroid Continue same dose of Synthroid as TSH normal in 03/18  Weight Loss and Low albumin Will get dietary Consult  H/oDVT  of right lower extremity Was On Lovenox. Was d/ced as he already had been treated for 4 months Family/ staff Communication:   Labs/tests ordered:UA, CBC, BMP, Dietary Consult Frequent Vitals   Total time spent in this patient care encounter was 45_ minutes; greater than 50% of the visit spent counseling patient, reviewing records , Labs and coordinating care for problems addressed at this encounter.

## 2017-06-12 ENCOUNTER — Encounter (HOSPITAL_COMMUNITY)
Admission: RE | Admit: 2017-06-12 | Discharge: 2017-06-12 | Disposition: A | Discharge status: Home / Self Care | Payer: Medicare Other | Source: Skilled Nursing Facility | Attending: *Deleted | Admitting: *Deleted

## 2017-06-12 LAB — URINALYSIS, COMPLETE (UACMP) WITH MICROSCOPIC
Bacteria, UA: NONE SEEN
Bilirubin Urine: NEGATIVE
Glucose, UA: NEGATIVE mg/dL
Hgb urine dipstick: NEGATIVE
Ketones, ur: NEGATIVE mg/dL
Leukocytes, UA: NEGATIVE
Nitrite: NEGATIVE
Protein, ur: 30 mg/dL — AB
Specific Gravity, Urine: 1.021 (ref 1.005–1.030)
pH: 5 (ref 5.0–8.0)

## 2017-06-13 DIAGNOSIS — Z992 Dependence on renal dialysis: Secondary | ICD-10-CM | POA: Diagnosis not present

## 2017-06-13 DIAGNOSIS — D509 Iron deficiency anemia, unspecified: Secondary | ICD-10-CM | POA: Diagnosis not present

## 2017-06-13 DIAGNOSIS — N186 End stage renal disease: Secondary | ICD-10-CM | POA: Diagnosis not present

## 2017-06-13 DIAGNOSIS — D631 Anemia in chronic kidney disease: Secondary | ICD-10-CM | POA: Diagnosis not present

## 2017-06-15 ENCOUNTER — Encounter (HOSPITAL_COMMUNITY)
Admission: RE | Admit: 2017-06-15 | Discharge: 2017-06-15 | Disposition: A | Discharge status: Home / Self Care | Payer: Medicare Other | Source: Skilled Nursing Facility | Attending: *Deleted | Admitting: *Deleted

## 2017-06-15 LAB — BASIC METABOLIC PANEL
Anion gap: 10 (ref 5–15)
BUN: 38 mg/dL — ABNORMAL HIGH (ref 6–20)
CO2: 21 mmol/L — ABNORMAL LOW (ref 22–32)
Calcium: 8.6 mg/dL — ABNORMAL LOW (ref 8.9–10.3)
Chloride: 102 mmol/L (ref 101–111)
Creatinine, Ser: 5.13 mg/dL — ABNORMAL HIGH (ref 0.61–1.24)
GFR calc Af Amer: 12 mL/min — ABNORMAL LOW (ref >60)
GFR calc non Af Amer: 10 mL/min — ABNORMAL LOW (ref >60)
Glucose, Bld: 226 mg/dL — ABNORMAL HIGH (ref 65–99)
Potassium: 3 mmol/L — ABNORMAL LOW (ref 3.5–5.1)
Sodium: 133 mmol/L — ABNORMAL LOW (ref 135–145)

## 2017-06-15 LAB — CBC WITH DIFFERENTIAL/PLATELET
Basophils Absolute: 0 10*3/uL (ref 0.0–0.1)
Basophils Relative: 0 %
Eosinophils Absolute: 0.2 10*3/uL (ref 0.0–0.7)
Eosinophils Relative: 2 %
HCT: 30.2 % — ABNORMAL LOW (ref 39.0–52.0)
Hemoglobin: 9.8 g/dL — ABNORMAL LOW (ref 13.0–17.0)
Lymphocytes Relative: 21 %
Lymphs Abs: 1.8 10*3/uL (ref 0.7–4.0)
MCH: 29.9 pg (ref 26.0–34.0)
MCHC: 32.5 g/dL (ref 30.0–36.0)
MCV: 92.1 fL (ref 78.0–100.0)
Monocytes Absolute: 0.8 10*3/uL (ref 0.1–1.0)
Monocytes Relative: 10 %
Neutro Abs: 5.8 10*3/uL (ref 1.7–7.7)
Neutrophils Relative %: 67 %
Platelets: 313 10*3/uL (ref 150–400)
RBC: 3.28 MIL/uL — ABNORMAL LOW (ref 4.22–5.81)
RDW: 17.7 % — ABNORMAL HIGH (ref 11.5–15.5)
WBC: 8.6 10*3/uL (ref 4.0–10.5)

## 2017-06-16 DIAGNOSIS — Z992 Dependence on renal dialysis: Secondary | ICD-10-CM | POA: Diagnosis not present

## 2017-06-16 DIAGNOSIS — N186 End stage renal disease: Secondary | ICD-10-CM | POA: Diagnosis not present

## 2017-06-16 DIAGNOSIS — D509 Iron deficiency anemia, unspecified: Secondary | ICD-10-CM | POA: Diagnosis not present

## 2017-06-16 DIAGNOSIS — D631 Anemia in chronic kidney disease: Secondary | ICD-10-CM | POA: Diagnosis not present

## 2017-06-18 DIAGNOSIS — Z992 Dependence on renal dialysis: Secondary | ICD-10-CM | POA: Diagnosis not present

## 2017-06-18 DIAGNOSIS — N186 End stage renal disease: Secondary | ICD-10-CM | POA: Diagnosis not present

## 2017-06-18 DIAGNOSIS — D509 Iron deficiency anemia, unspecified: Secondary | ICD-10-CM | POA: Diagnosis not present

## 2017-06-18 DIAGNOSIS — D631 Anemia in chronic kidney disease: Secondary | ICD-10-CM | POA: Diagnosis not present

## 2017-06-20 DIAGNOSIS — N186 End stage renal disease: Secondary | ICD-10-CM | POA: Diagnosis not present

## 2017-06-20 DIAGNOSIS — D631 Anemia in chronic kidney disease: Secondary | ICD-10-CM | POA: Diagnosis not present

## 2017-06-20 DIAGNOSIS — D509 Iron deficiency anemia, unspecified: Secondary | ICD-10-CM | POA: Diagnosis not present

## 2017-06-20 DIAGNOSIS — Z992 Dependence on renal dialysis: Secondary | ICD-10-CM | POA: Diagnosis not present

## 2017-06-22 ENCOUNTER — Encounter (HOSPITAL_COMMUNITY)
Admission: RE | Admit: 2017-06-22 | Discharge: 2017-06-22 | Disposition: A | Discharge status: Home / Self Care | Payer: Medicare Other | Source: Skilled Nursing Facility | Attending: Internal Medicine | Admitting: Internal Medicine

## 2017-06-22 LAB — BASIC METABOLIC PANEL
Anion gap: 11 (ref 5–15)
BUN: 43 mg/dL — ABNORMAL HIGH (ref 6–20)
CO2: 24 mmol/L (ref 22–32)
Calcium: 8.6 mg/dL — ABNORMAL LOW (ref 8.9–10.3)
Chloride: 98 mmol/L — ABNORMAL LOW (ref 101–111)
Creatinine, Ser: 4.72 mg/dL — ABNORMAL HIGH (ref 0.61–1.24)
GFR calc Af Amer: 13 mL/min — ABNORMAL LOW (ref >60)
GFR calc non Af Amer: 12 mL/min — ABNORMAL LOW (ref >60)
Glucose, Bld: 189 mg/dL — ABNORMAL HIGH (ref 65–99)
Potassium: 4 mmol/L (ref 3.5–5.1)
Sodium: 133 mmol/L — ABNORMAL LOW (ref 135–145)

## 2017-06-23 DIAGNOSIS — N186 End stage renal disease: Secondary | ICD-10-CM | POA: Diagnosis not present

## 2017-06-23 DIAGNOSIS — Z992 Dependence on renal dialysis: Secondary | ICD-10-CM | POA: Diagnosis not present

## 2017-06-23 DIAGNOSIS — D631 Anemia in chronic kidney disease: Secondary | ICD-10-CM | POA: Diagnosis not present

## 2017-06-23 DIAGNOSIS — D509 Iron deficiency anemia, unspecified: Secondary | ICD-10-CM | POA: Diagnosis not present

## 2017-06-25 DIAGNOSIS — C189 Malignant neoplasm of colon, unspecified: Secondary | ICD-10-CM | POA: Insufficient documentation

## 2017-06-25 DIAGNOSIS — D631 Anemia in chronic kidney disease: Secondary | ICD-10-CM | POA: Diagnosis not present

## 2017-06-25 DIAGNOSIS — N186 End stage renal disease: Secondary | ICD-10-CM | POA: Diagnosis not present

## 2017-06-25 DIAGNOSIS — D509 Iron deficiency anemia, unspecified: Secondary | ICD-10-CM | POA: Diagnosis not present

## 2017-06-25 DIAGNOSIS — Z992 Dependence on renal dialysis: Secondary | ICD-10-CM | POA: Diagnosis not present

## 2017-06-26 DIAGNOSIS — Z992 Dependence on renal dialysis: Secondary | ICD-10-CM | POA: Diagnosis not present

## 2017-06-26 DIAGNOSIS — N186 End stage renal disease: Secondary | ICD-10-CM | POA: Diagnosis not present

## 2017-06-27 DIAGNOSIS — N186 End stage renal disease: Secondary | ICD-10-CM | POA: Diagnosis not present

## 2017-06-27 DIAGNOSIS — Z992 Dependence on renal dialysis: Secondary | ICD-10-CM | POA: Diagnosis not present

## 2017-06-27 DIAGNOSIS — D509 Iron deficiency anemia, unspecified: Secondary | ICD-10-CM | POA: Diagnosis not present

## 2017-06-27 DIAGNOSIS — Z23 Encounter for immunization: Secondary | ICD-10-CM | POA: Diagnosis not present

## 2017-06-27 DIAGNOSIS — D631 Anemia in chronic kidney disease: Secondary | ICD-10-CM | POA: Diagnosis not present

## 2017-06-30 DIAGNOSIS — Z23 Encounter for immunization: Secondary | ICD-10-CM | POA: Diagnosis not present

## 2017-06-30 DIAGNOSIS — D631 Anemia in chronic kidney disease: Secondary | ICD-10-CM | POA: Diagnosis not present

## 2017-06-30 DIAGNOSIS — D509 Iron deficiency anemia, unspecified: Secondary | ICD-10-CM | POA: Diagnosis not present

## 2017-06-30 DIAGNOSIS — N186 End stage renal disease: Secondary | ICD-10-CM | POA: Diagnosis not present

## 2017-06-30 DIAGNOSIS — Z992 Dependence on renal dialysis: Secondary | ICD-10-CM | POA: Diagnosis not present

## 2017-07-02 DIAGNOSIS — Z23 Encounter for immunization: Secondary | ICD-10-CM | POA: Diagnosis not present

## 2017-07-02 DIAGNOSIS — D631 Anemia in chronic kidney disease: Secondary | ICD-10-CM | POA: Diagnosis not present

## 2017-07-02 DIAGNOSIS — D509 Iron deficiency anemia, unspecified: Secondary | ICD-10-CM | POA: Diagnosis not present

## 2017-07-02 DIAGNOSIS — N186 End stage renal disease: Secondary | ICD-10-CM | POA: Diagnosis not present

## 2017-07-02 DIAGNOSIS — Z992 Dependence on renal dialysis: Secondary | ICD-10-CM | POA: Diagnosis not present

## 2017-07-04 DIAGNOSIS — D631 Anemia in chronic kidney disease: Secondary | ICD-10-CM | POA: Diagnosis not present

## 2017-07-04 DIAGNOSIS — D509 Iron deficiency anemia, unspecified: Secondary | ICD-10-CM | POA: Diagnosis not present

## 2017-07-04 DIAGNOSIS — Z23 Encounter for immunization: Secondary | ICD-10-CM | POA: Diagnosis not present

## 2017-07-04 DIAGNOSIS — N186 End stage renal disease: Secondary | ICD-10-CM | POA: Diagnosis not present

## 2017-07-04 DIAGNOSIS — Z992 Dependence on renal dialysis: Secondary | ICD-10-CM | POA: Diagnosis not present

## 2017-07-07 DIAGNOSIS — Z23 Encounter for immunization: Secondary | ICD-10-CM | POA: Diagnosis not present

## 2017-07-07 DIAGNOSIS — D631 Anemia in chronic kidney disease: Secondary | ICD-10-CM | POA: Diagnosis not present

## 2017-07-07 DIAGNOSIS — D509 Iron deficiency anemia, unspecified: Secondary | ICD-10-CM | POA: Diagnosis not present

## 2017-07-07 DIAGNOSIS — N186 End stage renal disease: Secondary | ICD-10-CM | POA: Diagnosis not present

## 2017-07-07 DIAGNOSIS — Z992 Dependence on renal dialysis: Secondary | ICD-10-CM | POA: Diagnosis not present

## 2017-07-09 DIAGNOSIS — Z992 Dependence on renal dialysis: Secondary | ICD-10-CM | POA: Diagnosis not present

## 2017-07-09 DIAGNOSIS — D509 Iron deficiency anemia, unspecified: Secondary | ICD-10-CM | POA: Diagnosis not present

## 2017-07-09 DIAGNOSIS — Z23 Encounter for immunization: Secondary | ICD-10-CM | POA: Diagnosis not present

## 2017-07-09 DIAGNOSIS — D631 Anemia in chronic kidney disease: Secondary | ICD-10-CM | POA: Diagnosis not present

## 2017-07-09 DIAGNOSIS — N186 End stage renal disease: Secondary | ICD-10-CM | POA: Diagnosis not present

## 2017-07-10 ENCOUNTER — Non-Acute Institutional Stay (SKILLED_NURSING_FACILITY): Payer: Medicare Other | Admitting: Internal Medicine

## 2017-07-10 ENCOUNTER — Encounter: Payer: Self-pay | Admitting: Internal Medicine

## 2017-07-10 DIAGNOSIS — I10 Essential (primary) hypertension: Secondary | ICD-10-CM

## 2017-07-10 DIAGNOSIS — E1121 Type 2 diabetes mellitus with diabetic nephropathy: Secondary | ICD-10-CM

## 2017-07-10 DIAGNOSIS — D62 Acute posthemorrhagic anemia: Secondary | ICD-10-CM

## 2017-07-10 DIAGNOSIS — N186 End stage renal disease: Secondary | ICD-10-CM

## 2017-07-10 DIAGNOSIS — K922 Gastrointestinal hemorrhage, unspecified: Secondary | ICD-10-CM

## 2017-07-10 DIAGNOSIS — I82401 Acute embolism and thrombosis of unspecified deep veins of right lower extremity: Secondary | ICD-10-CM | POA: Diagnosis not present

## 2017-07-10 DIAGNOSIS — S72491A Other fracture of lower end of right femur, initial encounter for closed fracture: Secondary | ICD-10-CM | POA: Diagnosis not present

## 2017-07-10 NOTE — Progress Notes (Signed)
Location:   [facility] Room Number: 112/D Place of Service:  SNF (31) Provider:  Glennon Hamilton, MD  Patient Care Team: [name], MD as PCP - General (Internal Medicine) Cassandria Anger, MD as Consulting Physician (Endocrinology) Fran Lowes, MD as Consulting Physician (Nephrology) Gala Romney Cristopher Estimable, MD as Consulting Physician (Gastroenterology)  Extended Emergency Contact Information Primary Emergency Contact: Betsey Amen Address: DSS  [place] Phone: [phone] Work Phone: [phone] Relation: Legal Guardian Secondary Emergency Contact: Dillie,Richard Address: [address]          [place], [place] [address] [place] Phone: [phone] Relation: Brother  Code Status:  Full Code Goals of care: Advanced Directive information Advanced Directives 07/10/2017  Does Patient Have a Medical Advance Directive? Yes  Type of Advance Directive (No Data)  Does patient want to make changes to medical advance directive? No - Patient declined  Copy of [place] in Chart? -  Would patient like information on creating a medical advance directive? No - Patient declined  Pre-existing out of facility DNR order (yellow form or pink MOST form) -     Chief Complaint  Patient presents with  . Medical Management of Chronic Issues    Routine Visit  Medical management of chronic medical conditions including anemia with GI bleed-end-stage renal disease on dialysis-type 2 diabetes-hypothyroidism-history of right lower extremity DVT-history of right femur fracture status post repair-history of sepsis-hypertension-hyperlipidemia-  HPI:  Pt is a 71 y.o. male seen today for medical management of chronic diseases.  As noted above.  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  CT scan showed inflammation of the small bowel.  He was discharged back to skilled nursing here and again spiked the fever was tachycardic he was admitted to the hospitalist service-and treated with antibiotics also required transfusion-blood cultures were negative chest x-ray was negative.  His Lovenox was stopped on the most recent hospital admissions says he had finished duration of treatment for a right lower extremity DVT.  Initially had lost some weight and felt nauseated but he appears now to be tach at his baseline he is gained about 7 pounds over the past several weeks apparently his appetite has come back-.  Regards to other medical issues he continues dialysis 3 days we can appears to be tolerating this well.  He is a type II diabetic hemoglobin A1c was 5.4 on lab done August 2018 blood sugars run largely in the mid 100s.  Also has a history hypothyroidism TSH was normal at 3.5-7 back in late July.  Regards to hypertension he is no longer on Coreg secondary to concerns that he is at times somewhat hypotensive although asymptomatic and back manual blood pressure today was 92/58 but this is not really unusual for him he did not complain of any syncope palpitations or weakness-heart rate appears to be relatively well controlled despite being off the Coreg at times he will be mildly tachycardic he is not tachycardic today I got a pulse of 90.  Regards right femoral fracture this was per surgically and initially was the reason he was admitted to skilled nursing so currently he did develop a DVT again he is completed a course of Lovenox he is followed by orthopedics.  Currently he has no complaints he does have a  history of mild mental deficits but has done very well with supportive care     Past Medical History:  Diagnosis Date  . Abnormal CT scan, kidney 10/06/2011  . Acute pyelonephritis 10/07/2011  . Anemia     normocytic  . Anxiety    mental retardation  . Bladder wall thickening 10/06/2011  . BPH (benign prostatic hypertrophy)   . Diabetes mellitus   . Dialysis patient [facility])    Tuesday, Thursday and Saturday,   . Edema     history of lower extremity edema  . GERD (gastroesophageal reflux disease)   . Heme positive stool   . Hydronephrosis   . Hyperkalemia   . Hyperlipidemia   . Hypernatremia   . Hypertension   . Hypothyroidism   . Impaired speech   . 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 MR (mental retardation)   . Muscle weakness   . Obstructive uropathy   . 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 Poor historian poor historian  . Protein calorie malnutrition ([place])   . Pyelonephritis   . Renal failure (ARF), acute on chronic (HCC)   . Renal insufficiency    chronic history  . Sepsis ([place])   . Smoking   . Uremia   . Urinary retention   . UTI (lower urinary tract infection) 10/06/2011   Past Surgical History:  Procedure Laterality Date  . AV FISTULA PLACEMENT Left 07/06/2015   Procedure:  INSERTION LEFT ARM ARTERIOVENOUS GORTEX GRAFT;  Surgeon: Angelia Mould, MD;  Location: [place] [facility];  Service: Vascular;  Laterality: Left;  . AV FISTULA PLACEMENT Right 02/26/2016   Procedure: ARTERIOVENOUS (AV) FISTULA CREATION ;  Surgeon: Angelia Mould, MD;  Location: [facility];  Service: Vascular;  Laterality: Right;  . [place] REMOVAL Left 10/09/2015   Procedure: REMOVAL OF ARTERIOVENOUS GORETEX GRAFT ([place]) Evacuation of Lymphocele, Vein Patch angioplasty of brachial artery.;  Surgeon: Angelia Mould, MD;  Location: [facility];  Service: Vascular;  Laterality: Left;  . BASCILIC VEIN TRANSPOSITION Right 02/26/2016   Procedure: Right BASCILIC VEIN TRANSPOSITION;  Surgeon: Angelia Mould, MD;  Location: [facility];  Service: Vascular;  Laterality: Right;  . CIRCUMCISION N/A 01/04/2014   Procedure: CIRCUMCISION ADULT (procedure #1);  Surgeon: Marissa Nestle, MD;  Location: [facility];   Service: Urology;  Laterality: N/A;  . COLECTOMY N/A 05/04/2017   Procedure: TOTAL COLECTOMY;  Surgeon: Aviva Signs, MD;  Location: [facility];  Service: General;  Laterality: N/A;  . COLONOSCOPY N/A 04/27/2017   Procedure: COLONOSCOPY;  Surgeon: Daneil Dolin, MD;  [facility];  Service: Endoscopy;  Laterality: N/A;  245  . CYSTOSCOPY W/ RETROGRADES Bilateral 06/29/2015   Procedure: CYSTOSCOPY, DILATION OF URETHRAL STRICTURE WITH BILATERAL RETROGRADE PYELOGRAM,SUPRAPUBIC TUBE CHANGE;  Surgeon: Festus Aloe, MD;  Location: [facility];  Service: Urology;  Laterality: Bilateral;  . CYSTOSCOPY WITH URETHRAL DILATATION N/A 12/29/2013   Procedure: CYSTOSCOPY WITH URETHRAL DILATATION;  Surgeon: Marissa Nestle, MD;  Location[facility];  Service: Urology;  Laterality: N/A;  . ESOPHAGOGASTRODUODENOSCOPY N/A 04/27/2017   Procedure: ESOPHAGOGASTRODUODENOSCOPY (EGD);  Surgeon: Daneil Dolin, MD;  [facility];  Service: Endoscopy;  Laterality: N/A;  . ORIF FEMUR FRACTURE Right 11/22/2016   Procedure: OPEN REDUCTION INTERNAL FIXATION (ORIF) DISTAL FEMUR FRACTURE;  Surgeon: Rod Can, MD;  Location: [facility];  Service: Orthopedics;  Laterality: Right;  . PERIPHERAL VASCULAR CATHETERIZATION N/A 10/08/2015   Procedure: A/V Shuntogram;  Surgeon: Angelia Mould, MD;  Location: [facility]  INVASIVE CV LAB;  Service: Cardiovascular;  Laterality: N/A;  . TRANSURETHRAL RESECTION OF PROSTATE N/A 01/04/2014   Procedure: TRANSURETHRAL RESECTION OF THE PROSTATE (TURP) (procedure #2);  Surgeon: Marissa Nestle, MD;  Location: [facility];  Service: Urology;  Laterality: N/A;    No Known Allergies  Outpatient Encounter Prescriptions as of 07/10/2017  Medication Sig  . Amino Acids-Protein Hydrolys (FEEDING SUPPLEMENT, PRO-STAT 64,) LIQD Take 30 mLs by mouth 2 (two) times daily between meals.  Marland Kitchen atorvastatin (LIPITOR) 40 MG tablet Take 40 mg by mouth every evening.  Marland Kitchen levothyroxine (SYNTHROID, LEVOTHROID) 75  MCG tablet Take 75 mcg by mouth daily before breakfast.   . Multiple Vitamin (MULTIVITAMIN WITH MINERALS) TABS tablet Take 1 tablet by mouth every evening.   . Neo-Bacit-Poly-Lidocaine (FIRST AID PLUS LIDOCAINE EX) Apply 1 application topically Every Tuesday,Thursday,and Saturday with dialysis. Prior to dialysis  . Nutritional Supplements (NEPRO/CARBSTEADY PO) Take 1 Can by mouth 2 (two) times daily.  Marland Kitchen omeprazole (PRILOSEC) 40 MG capsule Take 40 mg by mouth every evening.  . tamsulosin (FLOMAX) 0.4 MG CAPS capsule Take 0.4 mg by mouth daily at 8 pm. Give 30 minutes after a meal  . torsemide (DEMADEX) 10 MG tablet Take 5 tablets (50 mg total) by mouth daily.  . [DISCONTINUED] amoxicillin-clavulanate (AUGMENTIN) 500-125 MG tablet Take 1 tablet (500 mg total) by mouth 2 (two) times daily.  . [DISCONTINUED] ferrous sulfate 325 (65 FE) MG tablet Take 325 mg by mouth daily with breakfast.   No facility-administered encounter medications on file as of 07/10/2017.      Review of Systems  This is somewhat limited since patient is somewhat of a poor historian and does not speak much.  Provided by nursing as well.  In general no complaints of fever chills weight appears to be trending up which is encourage.  Skin does not complain of rashes or itching.  Head ears eyes nose mouth and throat negative for visual changes or sore throat or difficulty swallowing.  Respiratory does not complain of shortness breath or cough.  Cardiac does not complain of chest pain appears to have fairly minimal edema.  GI is not complaining of any abdominal discomfort appetite apparently has improved.  Does not complain of nausea today.  GU does not complain of dysuria again he has end-stage renal disease on dialysis.  Muscle skeletal does not complain of joint pain.  Neurologic does not complain of dizziness headache syncope or lightheadedness.  Psych does have a history of mild cognitive deficits but  does  well with supportive care     Immunization History  Administered Date(s) Administered  . Influenza,inj,Quad PF,6+ Mos 12/26/2013, 07/09/2015  . PPD Test 03/07/2015  . Pneumococcal Polysaccharide-23 03/04/2015   Pertinent  Health Maintenance Due  Topic Date Due  . FOOT EXAM  07/26/2017 (Originally 08/23/1956)  . OPHTHALMOLOGY EXAM  07/26/2017 (Originally 08/23/1956)  . URINE MICROALBUMIN  07/26/2017 (Originally 08/23/1956)  . INFLUENZA VACCINE  09/26/2017 (Originally 05/27/2017)  . PNA vac Low Risk Adult (2 of 2 - PCV13) 09/26/2017 (Originally 03/03/2016)  . HEMOGLOBIN A1C  11/28/2017  . COLONOSCOPY  04/28/2027   No flowsheet data found. Functional Status Survey:    Vitals:   07/10/17 1317  BP: 101/64  Pulse: 90  Resp: 18  Temp: 97.9 F (36.6 C)  TempSrc: Oral  SpO2: 94%  Weight: 178 lb (80.7 kg)  Height: 5\' 8"  (1.727 m)  Manual blood pressure today was 92/58 Body mass index is 27.06 kg/m. Physical  Exam  n general this is pleasant male in no distress sitting comfortably in his wheelchair he is bright alert.  His skin is warm and dry does have a shunt right upper arm with bruit appreciated.  Eyes pupils appear reactive light sclera and conjunctiva are clear visual acuity appears grossly intact.  Oropharynx is clear mucous membranes are moist.  Chest is clear to auscultation there is no labored breathing.  Heart is regular rate and rhythm without murmur gallop or rub he has trace lower extremity edema.  Abdomen is soft nontender with active bowel sounds.  Musculoskeletal largely ambulates in a wheelchair moves all extremities 4 do not see any changes from baseline upper extremity strength appears preserved.  Neurologic is grossly intact has chronic lower extremity weakness cranial nerves appear grossly intact to speech is clear although he does not does not speak whole lot.  Psyche is oriented to self is pleasant and appropriate answers appropriately to  straightforward fairly simple questions baseline with previous exams   Labs reviewed:  Recent Labs  06/07/17 0800 06/08/17 0519 06/10/17 0727 06/15/17 0630 06/22/17 0700  NA 135 134* 137 133* 133*  K 3.1* 3.5 3.2* 3.0* 4.0  CL 97* 97* 100* 102 98*  CO2 27 27 26  21* 24  GLUCOSE 100* 96 146* 226* 189*  BUN 43* 50* 34* 38* 43*  CREATININE 5.53* 5.68* 4.96* 5.13* 4.72*  CALCIUM 7.9* 8.0* 8.4* 8.6* 8.6*  PHOS 5.3* 5.1* 3.7  --   --     Recent Labs  05/17/17 1558 05/18/17 1026  06/03/17 0055  06/07/17 0800 06/08/17 0519 06/10/17 0727  AST 36 29  --  16  --   --   --   --   ALT 17 15*  --  15*  --   --   --   --   ALKPHOS 102 92  --  87  --   --   --   --   BILITOT 1.0 0.9  --  0.5  --   --   --   --   PROT 5.9* 5.3*  --  6.5  --   --   --   --   ALBUMIN 2.3* 2.0*  < > 2.4*  < > 2.0* 2.0* 2.1*  < > = values in this interval not displayed.  Recent Labs  05/30/17 2323 06/03/17 0055  06/08/17 0518 06/10/17 0727 06/15/17 0630  WBC 8.4 6.6  < > 7.1 8.1 8.6  NEUTROABS 5.3 4.0  --   --   --  5.8  HGB 8.9* 5.8*  < > 7.3* 9.3* 9.8*  HCT 28.6* 18.4*  < > 22.6* 28.9* 30.2*  MCV 99.0 95.8  < > 93.0 90.9 92.1  PLT 376 280  < > 315 323 313  < > = values in this interval not displayed. Lab Results  Component Value Date   TSH 3.527 05/26/2017   Lab Results  Component Value Date   HGBA1C 5.4 05/28/2017   Lab Results  Component Value Date   CHOL 98 01/16/2017   HDL 33 (L) 01/16/2017   LDLCALC 50 01/16/2017   TRIG 77 01/16/2017   CHOLHDL 3.0 01/16/2017    Significant Diagnostic Results in last 30 days:  No results found.  Assessment/Plan  #1-recent history of anemia with GI bleed status post colectomy in July-with dysplastic polyps-actually has been stable  did lose weight initially but appears to have made a nice recovery after several hospitalizations-he is on  Prilosec-at this point will monitor he is gaining weight    Will update lab check hemoglobin and this can  be done at dialysis if more convenient.  Last note hemoglobin was 9.8 in late August 2018  #2 history of sepsis again this was treated in the hospital antibiotics he does not show signs of sepsis at this point he has been afebrile appears to be largely back at his baseline which is encouraging.  #3 history of right leg DVT again this was treated for an extended period with Lovenox and has since been DC'd secondary to completing duration of treatment-.  #4-history of end-stage renal disease continues on dialysis he does receive Demadex this is followed by nephrology.  #5 history diabetes type 2 hemoglobin A1c was 5.4 in early August 2018 CBGs appear to be stable ranging from 101-to the low 200s averaging more in the mid 100s-this appears to be diet controlled although at times dietary compliance per nursing is somewhat of a challenge.  #6-history of hypothyroidism TSH was within normal limits in March at 3.5-7 will want her this periodically. He is on Synthroid  #7 history of hypertension actually hypotensive readings are more common-he is now off Coreg-he is not symptomatic despite diastolics at times in the 90s which is the case today at this point will monitor he is not on any blood pressure medication.  #8-history of right femur fracture again he is followed by orthopedics appears have done relatively well-ambulates largely in a wheelchair.  #9 history of hyperlipidemia LDL was 50 on lab done in March 2018 he is on a statin.  #10 history of weight loss this appears to have reversed itself he is eating better has gained about 6 pounds over the past month-again he did have significant GI issues as noted above which I suspect contributed to the weight loss but this appears to be reversing itself which is encouraging.  Again we'll try to update lab work including a CBC that can be done at dialysis if more convenient to keep an eye on his hemoglobin but clinically he appears to be doing  well.  [id number] note greater than 35 minutes spent assessing patient-discussing his status with nursing staff-reviewing his chart   and labs-and coordinating plan of carefor -numerous diagnoses of note greater than 50% of time spent coordinating plan of care

## 2017-07-11 DIAGNOSIS — Z23 Encounter for immunization: Secondary | ICD-10-CM | POA: Diagnosis not present

## 2017-07-11 DIAGNOSIS — D631 Anemia in chronic kidney disease: Secondary | ICD-10-CM | POA: Diagnosis not present

## 2017-07-11 DIAGNOSIS — Z992 Dependence on renal dialysis: Secondary | ICD-10-CM | POA: Diagnosis not present

## 2017-07-11 DIAGNOSIS — N186 End stage renal disease: Secondary | ICD-10-CM | POA: Diagnosis not present

## 2017-07-11 DIAGNOSIS — D509 Iron deficiency anemia, unspecified: Secondary | ICD-10-CM | POA: Diagnosis not present

## 2017-07-13 ENCOUNTER — Encounter (HOSPITAL_COMMUNITY)
Admission: RE | Admit: 2017-07-13 | Discharge: 2017-07-13 | Disposition: A | Discharge status: Home / Self Care | Payer: Medicare Other | Source: Skilled Nursing Facility | Attending: Internal Medicine | Admitting: Internal Medicine

## 2017-07-13 DIAGNOSIS — Z7901 Long term (current) use of anticoagulants: Secondary | ICD-10-CM | POA: Insufficient documentation

## 2017-07-13 DIAGNOSIS — N401 Enlarged prostate with lower urinary tract symptoms: Secondary | ICD-10-CM | POA: Diagnosis not present

## 2017-07-13 DIAGNOSIS — A4189 Other specified sepsis: Secondary | ICD-10-CM | POA: Diagnosis not present

## 2017-07-13 DIAGNOSIS — Z9049 Acquired absence of other specified parts of digestive tract: Secondary | ICD-10-CM | POA: Diagnosis not present

## 2017-07-13 LAB — CBC WITH DIFFERENTIAL/PLATELET
Basophils Absolute: 0 10*3/uL (ref 0.0–0.1)
Basophils Relative: 0 %
Eosinophils Absolute: 0.3 10*3/uL (ref 0.0–0.7)
Eosinophils Relative: 4 %
HCT: 39.6 % (ref 39.0–52.0)
Hemoglobin: 12.6 g/dL — ABNORMAL LOW (ref 13.0–17.0)
Lymphocytes Relative: 28 %
Lymphs Abs: 2 10*3/uL (ref 0.7–4.0)
MCH: 31.9 pg (ref 26.0–34.0)
MCHC: 31.8 g/dL (ref 30.0–36.0)
MCV: 100.3 fL — ABNORMAL HIGH (ref 78.0–100.0)
Monocytes Absolute: 0.8 10*3/uL (ref 0.1–1.0)
Monocytes Relative: 11 %
Neutro Abs: 4.1 10*3/uL (ref 1.7–7.7)
Neutrophils Relative %: 57 %
Platelets: 181 10*3/uL (ref 150–400)
RBC: 3.95 MIL/uL — ABNORMAL LOW (ref 4.22–5.81)
RDW: 21 % — ABNORMAL HIGH (ref 11.5–15.5)
WBC: 7.1 10*3/uL (ref 4.0–10.5)

## 2017-07-13 LAB — BASIC METABOLIC PANEL
Anion gap: 10 (ref 5–15)
BUN: 43 mg/dL — ABNORMAL HIGH (ref 6–20)
CO2: 26 mmol/L (ref 22–32)
Calcium: 8.7 mg/dL — ABNORMAL LOW (ref 8.9–10.3)
Chloride: 100 mmol/L — ABNORMAL LOW (ref 101–111)
Creatinine, Ser: 6.5 mg/dL — ABNORMAL HIGH (ref 0.61–1.24)
GFR calc Af Amer: 9 mL/min — ABNORMAL LOW (ref >60)
GFR calc non Af Amer: 8 mL/min — ABNORMAL LOW (ref >60)
Glucose, Bld: 146 mg/dL — ABNORMAL HIGH (ref 65–99)
Potassium: 4.1 mmol/L (ref 3.5–5.1)
Sodium: 136 mmol/L (ref 135–145)

## 2017-07-13 LAB — LIPID PANEL
Cholesterol: 104 mg/dL (ref 0–200)
HDL: 34 mg/dL — ABNORMAL LOW (ref >40)
LDL Cholesterol: 28 mg/dL (ref 0–99)
Total CHOL/HDL Ratio: 3.1 RATIO
Triglycerides: 208 mg/dL — ABNORMAL HIGH (ref <150)
VLDL: 42 mg/dL — ABNORMAL HIGH (ref 0–40)

## 2017-07-14 DIAGNOSIS — D509 Iron deficiency anemia, unspecified: Secondary | ICD-10-CM | POA: Diagnosis not present

## 2017-07-14 DIAGNOSIS — Z992 Dependence on renal dialysis: Secondary | ICD-10-CM | POA: Diagnosis not present

## 2017-07-14 DIAGNOSIS — N186 End stage renal disease: Secondary | ICD-10-CM | POA: Diagnosis not present

## 2017-07-14 DIAGNOSIS — D631 Anemia in chronic kidney disease: Secondary | ICD-10-CM | POA: Diagnosis not present

## 2017-07-14 DIAGNOSIS — Z23 Encounter for immunization: Secondary | ICD-10-CM | POA: Diagnosis not present

## 2017-07-16 DIAGNOSIS — Z23 Encounter for immunization: Secondary | ICD-10-CM | POA: Diagnosis not present

## 2017-07-16 DIAGNOSIS — D509 Iron deficiency anemia, unspecified: Secondary | ICD-10-CM | POA: Diagnosis not present

## 2017-07-16 DIAGNOSIS — Z992 Dependence on renal dialysis: Secondary | ICD-10-CM | POA: Diagnosis not present

## 2017-07-16 DIAGNOSIS — D631 Anemia in chronic kidney disease: Secondary | ICD-10-CM | POA: Diagnosis not present

## 2017-07-16 DIAGNOSIS — N186 End stage renal disease: Secondary | ICD-10-CM | POA: Diagnosis not present

## 2017-07-17 DIAGNOSIS — S72491D Other fracture of lower end of right femur, subsequent encounter for closed fracture with routine healing: Secondary | ICD-10-CM | POA: Diagnosis not present

## 2017-07-18 DIAGNOSIS — D631 Anemia in chronic kidney disease: Secondary | ICD-10-CM | POA: Diagnosis not present

## 2017-07-18 DIAGNOSIS — N186 End stage renal disease: Secondary | ICD-10-CM | POA: Diagnosis not present

## 2017-07-18 DIAGNOSIS — D509 Iron deficiency anemia, unspecified: Secondary | ICD-10-CM | POA: Diagnosis not present

## 2017-07-18 DIAGNOSIS — Z23 Encounter for immunization: Secondary | ICD-10-CM | POA: Diagnosis not present

## 2017-07-18 DIAGNOSIS — Z992 Dependence on renal dialysis: Secondary | ICD-10-CM | POA: Diagnosis not present

## 2017-07-20 DIAGNOSIS — F0631 Mood disorder due to known physiological condition with depressive features: Secondary | ICD-10-CM | POA: Diagnosis not present

## 2017-07-21 DIAGNOSIS — Z992 Dependence on renal dialysis: Secondary | ICD-10-CM | POA: Diagnosis not present

## 2017-07-21 DIAGNOSIS — N186 End stage renal disease: Secondary | ICD-10-CM | POA: Diagnosis not present

## 2017-07-21 DIAGNOSIS — Z23 Encounter for immunization: Secondary | ICD-10-CM | POA: Diagnosis not present

## 2017-07-21 DIAGNOSIS — D631 Anemia in chronic kidney disease: Secondary | ICD-10-CM | POA: Diagnosis not present

## 2017-07-21 DIAGNOSIS — D509 Iron deficiency anemia, unspecified: Secondary | ICD-10-CM | POA: Diagnosis not present

## 2017-07-22 DIAGNOSIS — E119 Type 2 diabetes mellitus without complications: Secondary | ICD-10-CM | POA: Diagnosis not present

## 2017-07-22 DIAGNOSIS — N186 End stage renal disease: Secondary | ICD-10-CM | POA: Diagnosis not present

## 2017-07-22 DIAGNOSIS — R600 Localized edema: Secondary | ICD-10-CM | POA: Diagnosis not present

## 2017-07-22 DIAGNOSIS — D649 Anemia, unspecified: Secondary | ICD-10-CM | POA: Insufficient documentation

## 2017-07-22 DIAGNOSIS — Z7984 Long term (current) use of oral hypoglycemic drugs: Secondary | ICD-10-CM | POA: Diagnosis not present

## 2017-07-22 DIAGNOSIS — M6281 Muscle weakness (generalized): Secondary | ICD-10-CM | POA: Diagnosis not present

## 2017-07-22 DIAGNOSIS — B351 Tinea unguium: Secondary | ICD-10-CM | POA: Diagnosis not present

## 2017-07-22 DIAGNOSIS — R262 Difficulty in walking, not elsewhere classified: Secondary | ICD-10-CM | POA: Diagnosis not present

## 2017-07-23 DIAGNOSIS — M6281 Muscle weakness (generalized): Secondary | ICD-10-CM | POA: Diagnosis not present

## 2017-07-23 DIAGNOSIS — D631 Anemia in chronic kidney disease: Secondary | ICD-10-CM | POA: Diagnosis not present

## 2017-07-23 DIAGNOSIS — N186 End stage renal disease: Secondary | ICD-10-CM | POA: Diagnosis not present

## 2017-07-23 DIAGNOSIS — D509 Iron deficiency anemia, unspecified: Secondary | ICD-10-CM | POA: Diagnosis not present

## 2017-07-23 DIAGNOSIS — Z23 Encounter for immunization: Secondary | ICD-10-CM | POA: Diagnosis not present

## 2017-07-23 DIAGNOSIS — Z992 Dependence on renal dialysis: Secondary | ICD-10-CM | POA: Diagnosis not present

## 2017-07-23 DIAGNOSIS — R262 Difficulty in walking, not elsewhere classified: Secondary | ICD-10-CM | POA: Diagnosis not present

## 2017-07-24 DIAGNOSIS — M6281 Muscle weakness (generalized): Secondary | ICD-10-CM | POA: Diagnosis not present

## 2017-07-24 DIAGNOSIS — R262 Difficulty in walking, not elsewhere classified: Secondary | ICD-10-CM | POA: Diagnosis not present

## 2017-07-24 DIAGNOSIS — N186 End stage renal disease: Secondary | ICD-10-CM | POA: Diagnosis not present

## 2017-07-25 DIAGNOSIS — Z992 Dependence on renal dialysis: Secondary | ICD-10-CM | POA: Diagnosis not present

## 2017-07-25 DIAGNOSIS — N186 End stage renal disease: Secondary | ICD-10-CM | POA: Diagnosis not present

## 2017-07-25 DIAGNOSIS — D509 Iron deficiency anemia, unspecified: Secondary | ICD-10-CM | POA: Diagnosis not present

## 2017-07-25 DIAGNOSIS — Z23 Encounter for immunization: Secondary | ICD-10-CM | POA: Diagnosis not present

## 2017-07-25 DIAGNOSIS — D631 Anemia in chronic kidney disease: Secondary | ICD-10-CM | POA: Diagnosis not present

## 2017-07-26 DIAGNOSIS — Z992 Dependence on renal dialysis: Secondary | ICD-10-CM | POA: Diagnosis not present

## 2017-07-26 DIAGNOSIS — N186 End stage renal disease: Secondary | ICD-10-CM | POA: Diagnosis not present

## 2017-07-27 DIAGNOSIS — F0631 Mood disorder due to known physiological condition with depressive features: Secondary | ICD-10-CM | POA: Diagnosis not present

## 2017-07-27 DIAGNOSIS — R262 Difficulty in walking, not elsewhere classified: Secondary | ICD-10-CM | POA: Diagnosis not present

## 2017-07-27 DIAGNOSIS — N186 End stage renal disease: Secondary | ICD-10-CM | POA: Diagnosis not present

## 2017-07-27 DIAGNOSIS — M6281 Muscle weakness (generalized): Secondary | ICD-10-CM | POA: Diagnosis not present

## 2017-07-28 DIAGNOSIS — R262 Difficulty in walking, not elsewhere classified: Secondary | ICD-10-CM | POA: Diagnosis not present

## 2017-07-28 DIAGNOSIS — M6281 Muscle weakness (generalized): Secondary | ICD-10-CM | POA: Diagnosis not present

## 2017-07-28 DIAGNOSIS — Z23 Encounter for immunization: Secondary | ICD-10-CM | POA: Diagnosis not present

## 2017-07-28 DIAGNOSIS — D509 Iron deficiency anemia, unspecified: Secondary | ICD-10-CM | POA: Diagnosis not present

## 2017-07-28 DIAGNOSIS — N186 End stage renal disease: Secondary | ICD-10-CM | POA: Diagnosis not present

## 2017-07-28 DIAGNOSIS — N2581 Secondary hyperparathyroidism of renal origin: Secondary | ICD-10-CM | POA: Diagnosis not present

## 2017-07-28 DIAGNOSIS — Z992 Dependence on renal dialysis: Secondary | ICD-10-CM | POA: Diagnosis not present

## 2017-07-28 DIAGNOSIS — D631 Anemia in chronic kidney disease: Secondary | ICD-10-CM | POA: Diagnosis not present

## 2017-07-29 DIAGNOSIS — R262 Difficulty in walking, not elsewhere classified: Secondary | ICD-10-CM | POA: Diagnosis not present

## 2017-07-29 DIAGNOSIS — M6281 Muscle weakness (generalized): Secondary | ICD-10-CM | POA: Diagnosis not present

## 2017-07-29 DIAGNOSIS — N186 End stage renal disease: Secondary | ICD-10-CM | POA: Diagnosis not present

## 2017-07-30 DIAGNOSIS — N186 End stage renal disease: Secondary | ICD-10-CM | POA: Diagnosis not present

## 2017-07-30 DIAGNOSIS — R262 Difficulty in walking, not elsewhere classified: Secondary | ICD-10-CM | POA: Diagnosis not present

## 2017-07-30 DIAGNOSIS — M6281 Muscle weakness (generalized): Secondary | ICD-10-CM | POA: Diagnosis not present

## 2017-07-30 DIAGNOSIS — D509 Iron deficiency anemia, unspecified: Secondary | ICD-10-CM | POA: Diagnosis not present

## 2017-07-30 DIAGNOSIS — Z23 Encounter for immunization: Secondary | ICD-10-CM | POA: Diagnosis not present

## 2017-07-30 DIAGNOSIS — Z992 Dependence on renal dialysis: Secondary | ICD-10-CM | POA: Diagnosis not present

## 2017-07-30 DIAGNOSIS — N2581 Secondary hyperparathyroidism of renal origin: Secondary | ICD-10-CM | POA: Diagnosis not present

## 2017-07-30 DIAGNOSIS — D631 Anemia in chronic kidney disease: Secondary | ICD-10-CM | POA: Diagnosis not present

## 2017-07-31 DIAGNOSIS — N186 End stage renal disease: Secondary | ICD-10-CM | POA: Diagnosis not present

## 2017-07-31 DIAGNOSIS — R262 Difficulty in walking, not elsewhere classified: Secondary | ICD-10-CM | POA: Diagnosis not present

## 2017-07-31 DIAGNOSIS — M6281 Muscle weakness (generalized): Secondary | ICD-10-CM | POA: Diagnosis not present

## 2017-08-01 DIAGNOSIS — Z992 Dependence on renal dialysis: Secondary | ICD-10-CM | POA: Diagnosis not present

## 2017-08-01 DIAGNOSIS — N2581 Secondary hyperparathyroidism of renal origin: Secondary | ICD-10-CM | POA: Diagnosis not present

## 2017-08-01 DIAGNOSIS — D509 Iron deficiency anemia, unspecified: Secondary | ICD-10-CM | POA: Diagnosis not present

## 2017-08-01 DIAGNOSIS — Z23 Encounter for immunization: Secondary | ICD-10-CM | POA: Diagnosis not present

## 2017-08-01 DIAGNOSIS — D631 Anemia in chronic kidney disease: Secondary | ICD-10-CM | POA: Diagnosis not present

## 2017-08-01 DIAGNOSIS — N186 End stage renal disease: Secondary | ICD-10-CM | POA: Diagnosis not present

## 2017-08-03 DIAGNOSIS — R262 Difficulty in walking, not elsewhere classified: Secondary | ICD-10-CM | POA: Diagnosis not present

## 2017-08-03 DIAGNOSIS — M6281 Muscle weakness (generalized): Secondary | ICD-10-CM | POA: Diagnosis not present

## 2017-08-03 DIAGNOSIS — N186 End stage renal disease: Secondary | ICD-10-CM | POA: Diagnosis not present

## 2017-08-03 DIAGNOSIS — F0631 Mood disorder due to known physiological condition with depressive features: Secondary | ICD-10-CM | POA: Diagnosis not present

## 2017-08-04 DIAGNOSIS — M6281 Muscle weakness (generalized): Secondary | ICD-10-CM | POA: Diagnosis not present

## 2017-08-04 DIAGNOSIS — D631 Anemia in chronic kidney disease: Secondary | ICD-10-CM | POA: Diagnosis not present

## 2017-08-04 DIAGNOSIS — N186 End stage renal disease: Secondary | ICD-10-CM | POA: Diagnosis not present

## 2017-08-04 DIAGNOSIS — Z992 Dependence on renal dialysis: Secondary | ICD-10-CM | POA: Diagnosis not present

## 2017-08-04 DIAGNOSIS — Z23 Encounter for immunization: Secondary | ICD-10-CM | POA: Diagnosis not present

## 2017-08-04 DIAGNOSIS — D509 Iron deficiency anemia, unspecified: Secondary | ICD-10-CM | POA: Diagnosis not present

## 2017-08-04 DIAGNOSIS — R262 Difficulty in walking, not elsewhere classified: Secondary | ICD-10-CM | POA: Diagnosis not present

## 2017-08-04 DIAGNOSIS — E119 Type 2 diabetes mellitus without complications: Secondary | ICD-10-CM | POA: Diagnosis not present

## 2017-08-04 DIAGNOSIS — N2581 Secondary hyperparathyroidism of renal origin: Secondary | ICD-10-CM | POA: Diagnosis not present

## 2017-08-05 DIAGNOSIS — N186 End stage renal disease: Secondary | ICD-10-CM | POA: Diagnosis not present

## 2017-08-05 DIAGNOSIS — R262 Difficulty in walking, not elsewhere classified: Secondary | ICD-10-CM | POA: Diagnosis not present

## 2017-08-05 DIAGNOSIS — M6281 Muscle weakness (generalized): Secondary | ICD-10-CM | POA: Diagnosis not present

## 2017-08-06 DIAGNOSIS — Z23 Encounter for immunization: Secondary | ICD-10-CM | POA: Diagnosis not present

## 2017-08-06 DIAGNOSIS — D509 Iron deficiency anemia, unspecified: Secondary | ICD-10-CM | POA: Diagnosis not present

## 2017-08-06 DIAGNOSIS — N186 End stage renal disease: Secondary | ICD-10-CM | POA: Diagnosis not present

## 2017-08-06 DIAGNOSIS — R262 Difficulty in walking, not elsewhere classified: Secondary | ICD-10-CM | POA: Diagnosis not present

## 2017-08-06 DIAGNOSIS — M6281 Muscle weakness (generalized): Secondary | ICD-10-CM | POA: Diagnosis not present

## 2017-08-06 DIAGNOSIS — N2581 Secondary hyperparathyroidism of renal origin: Secondary | ICD-10-CM | POA: Diagnosis not present

## 2017-08-06 DIAGNOSIS — Z992 Dependence on renal dialysis: Secondary | ICD-10-CM | POA: Diagnosis not present

## 2017-08-06 DIAGNOSIS — D631 Anemia in chronic kidney disease: Secondary | ICD-10-CM | POA: Diagnosis not present

## 2017-08-07 DIAGNOSIS — R262 Difficulty in walking, not elsewhere classified: Secondary | ICD-10-CM | POA: Diagnosis not present

## 2017-08-07 DIAGNOSIS — M6281 Muscle weakness (generalized): Secondary | ICD-10-CM | POA: Diagnosis not present

## 2017-08-07 DIAGNOSIS — N186 End stage renal disease: Secondary | ICD-10-CM | POA: Diagnosis not present

## 2017-08-09 DIAGNOSIS — D509 Iron deficiency anemia, unspecified: Secondary | ICD-10-CM | POA: Diagnosis not present

## 2017-08-09 DIAGNOSIS — N186 End stage renal disease: Secondary | ICD-10-CM | POA: Diagnosis not present

## 2017-08-09 DIAGNOSIS — Z992 Dependence on renal dialysis: Secondary | ICD-10-CM | POA: Diagnosis not present

## 2017-08-09 DIAGNOSIS — N2581 Secondary hyperparathyroidism of renal origin: Secondary | ICD-10-CM | POA: Diagnosis not present

## 2017-08-09 DIAGNOSIS — Z23 Encounter for immunization: Secondary | ICD-10-CM | POA: Diagnosis not present

## 2017-08-09 DIAGNOSIS — D631 Anemia in chronic kidney disease: Secondary | ICD-10-CM | POA: Diagnosis not present

## 2017-08-11 DIAGNOSIS — N2581 Secondary hyperparathyroidism of renal origin: Secondary | ICD-10-CM | POA: Diagnosis not present

## 2017-08-11 DIAGNOSIS — Z992 Dependence on renal dialysis: Secondary | ICD-10-CM | POA: Diagnosis not present

## 2017-08-11 DIAGNOSIS — Z23 Encounter for immunization: Secondary | ICD-10-CM | POA: Diagnosis not present

## 2017-08-11 DIAGNOSIS — D631 Anemia in chronic kidney disease: Secondary | ICD-10-CM | POA: Diagnosis not present

## 2017-08-11 DIAGNOSIS — D509 Iron deficiency anemia, unspecified: Secondary | ICD-10-CM | POA: Diagnosis not present

## 2017-08-11 DIAGNOSIS — N186 End stage renal disease: Secondary | ICD-10-CM | POA: Diagnosis not present

## 2017-08-11 DIAGNOSIS — M6281 Muscle weakness (generalized): Secondary | ICD-10-CM | POA: Diagnosis not present

## 2017-08-11 DIAGNOSIS — R262 Difficulty in walking, not elsewhere classified: Secondary | ICD-10-CM | POA: Diagnosis not present

## 2017-08-12 DIAGNOSIS — R262 Difficulty in walking, not elsewhere classified: Secondary | ICD-10-CM | POA: Diagnosis not present

## 2017-08-12 DIAGNOSIS — N186 End stage renal disease: Secondary | ICD-10-CM | POA: Diagnosis not present

## 2017-08-12 DIAGNOSIS — M6281 Muscle weakness (generalized): Secondary | ICD-10-CM | POA: Diagnosis not present

## 2017-08-13 DIAGNOSIS — D509 Iron deficiency anemia, unspecified: Secondary | ICD-10-CM | POA: Diagnosis not present

## 2017-08-13 DIAGNOSIS — R262 Difficulty in walking, not elsewhere classified: Secondary | ICD-10-CM | POA: Diagnosis not present

## 2017-08-13 DIAGNOSIS — N186 End stage renal disease: Secondary | ICD-10-CM | POA: Diagnosis not present

## 2017-08-13 DIAGNOSIS — M6281 Muscle weakness (generalized): Secondary | ICD-10-CM | POA: Diagnosis not present

## 2017-08-13 DIAGNOSIS — D631 Anemia in chronic kidney disease: Secondary | ICD-10-CM | POA: Diagnosis not present

## 2017-08-13 DIAGNOSIS — Z23 Encounter for immunization: Secondary | ICD-10-CM | POA: Diagnosis not present

## 2017-08-13 DIAGNOSIS — Z992 Dependence on renal dialysis: Secondary | ICD-10-CM | POA: Diagnosis not present

## 2017-08-13 DIAGNOSIS — N2581 Secondary hyperparathyroidism of renal origin: Secondary | ICD-10-CM | POA: Diagnosis not present

## 2017-08-14 DIAGNOSIS — M6281 Muscle weakness (generalized): Secondary | ICD-10-CM | POA: Diagnosis not present

## 2017-08-14 DIAGNOSIS — N186 End stage renal disease: Secondary | ICD-10-CM | POA: Diagnosis not present

## 2017-08-14 DIAGNOSIS — R262 Difficulty in walking, not elsewhere classified: Secondary | ICD-10-CM | POA: Diagnosis not present

## 2017-08-15 DIAGNOSIS — M6281 Muscle weakness (generalized): Secondary | ICD-10-CM | POA: Diagnosis not present

## 2017-08-15 DIAGNOSIS — N186 End stage renal disease: Secondary | ICD-10-CM | POA: Diagnosis not present

## 2017-08-15 DIAGNOSIS — Z23 Encounter for immunization: Secondary | ICD-10-CM | POA: Diagnosis not present

## 2017-08-15 DIAGNOSIS — N2581 Secondary hyperparathyroidism of renal origin: Secondary | ICD-10-CM | POA: Diagnosis not present

## 2017-08-15 DIAGNOSIS — D509 Iron deficiency anemia, unspecified: Secondary | ICD-10-CM | POA: Diagnosis not present

## 2017-08-15 DIAGNOSIS — Z992 Dependence on renal dialysis: Secondary | ICD-10-CM | POA: Diagnosis not present

## 2017-08-15 DIAGNOSIS — D631 Anemia in chronic kidney disease: Secondary | ICD-10-CM | POA: Diagnosis not present

## 2017-08-15 DIAGNOSIS — R262 Difficulty in walking, not elsewhere classified: Secondary | ICD-10-CM | POA: Diagnosis not present

## 2017-08-17 DIAGNOSIS — N186 End stage renal disease: Secondary | ICD-10-CM | POA: Diagnosis not present

## 2017-08-17 DIAGNOSIS — F0631 Mood disorder due to known physiological condition with depressive features: Secondary | ICD-10-CM | POA: Diagnosis not present

## 2017-08-17 DIAGNOSIS — M6281 Muscle weakness (generalized): Secondary | ICD-10-CM | POA: Diagnosis not present

## 2017-08-17 DIAGNOSIS — R262 Difficulty in walking, not elsewhere classified: Secondary | ICD-10-CM | POA: Diagnosis not present

## 2017-08-18 DIAGNOSIS — R262 Difficulty in walking, not elsewhere classified: Secondary | ICD-10-CM | POA: Diagnosis not present

## 2017-08-18 DIAGNOSIS — N186 End stage renal disease: Secondary | ICD-10-CM | POA: Diagnosis not present

## 2017-08-18 DIAGNOSIS — Z23 Encounter for immunization: Secondary | ICD-10-CM | POA: Diagnosis not present

## 2017-08-18 DIAGNOSIS — M6281 Muscle weakness (generalized): Secondary | ICD-10-CM | POA: Diagnosis not present

## 2017-08-18 DIAGNOSIS — N2581 Secondary hyperparathyroidism of renal origin: Secondary | ICD-10-CM | POA: Diagnosis not present

## 2017-08-18 DIAGNOSIS — D509 Iron deficiency anemia, unspecified: Secondary | ICD-10-CM | POA: Diagnosis not present

## 2017-08-18 DIAGNOSIS — Z992 Dependence on renal dialysis: Secondary | ICD-10-CM | POA: Diagnosis not present

## 2017-08-18 DIAGNOSIS — D631 Anemia in chronic kidney disease: Secondary | ICD-10-CM | POA: Diagnosis not present

## 2017-08-19 DIAGNOSIS — N186 End stage renal disease: Secondary | ICD-10-CM | POA: Diagnosis not present

## 2017-08-19 DIAGNOSIS — M6281 Muscle weakness (generalized): Secondary | ICD-10-CM | POA: Diagnosis not present

## 2017-08-19 DIAGNOSIS — R262 Difficulty in walking, not elsewhere classified: Secondary | ICD-10-CM | POA: Diagnosis not present

## 2017-08-20 DIAGNOSIS — N186 End stage renal disease: Secondary | ICD-10-CM | POA: Diagnosis not present

## 2017-08-20 DIAGNOSIS — D509 Iron deficiency anemia, unspecified: Secondary | ICD-10-CM | POA: Diagnosis not present

## 2017-08-20 DIAGNOSIS — Z992 Dependence on renal dialysis: Secondary | ICD-10-CM | POA: Diagnosis not present

## 2017-08-20 DIAGNOSIS — D631 Anemia in chronic kidney disease: Secondary | ICD-10-CM | POA: Diagnosis not present

## 2017-08-20 DIAGNOSIS — N2581 Secondary hyperparathyroidism of renal origin: Secondary | ICD-10-CM | POA: Diagnosis not present

## 2017-08-20 DIAGNOSIS — Z23 Encounter for immunization: Secondary | ICD-10-CM | POA: Diagnosis not present

## 2017-08-22 DIAGNOSIS — D631 Anemia in chronic kidney disease: Secondary | ICD-10-CM | POA: Diagnosis not present

## 2017-08-22 DIAGNOSIS — Z23 Encounter for immunization: Secondary | ICD-10-CM | POA: Diagnosis not present

## 2017-08-22 DIAGNOSIS — D509 Iron deficiency anemia, unspecified: Secondary | ICD-10-CM | POA: Diagnosis not present

## 2017-08-22 DIAGNOSIS — N186 End stage renal disease: Secondary | ICD-10-CM | POA: Diagnosis not present

## 2017-08-22 DIAGNOSIS — Z992 Dependence on renal dialysis: Secondary | ICD-10-CM | POA: Diagnosis not present

## 2017-08-22 DIAGNOSIS — N2581 Secondary hyperparathyroidism of renal origin: Secondary | ICD-10-CM | POA: Diagnosis not present

## 2017-08-24 DIAGNOSIS — F0631 Mood disorder due to known physiological condition with depressive features: Secondary | ICD-10-CM | POA: Diagnosis not present

## 2017-08-25 ENCOUNTER — Encounter: Payer: Self-pay | Admitting: Internal Medicine

## 2017-08-25 DIAGNOSIS — D631 Anemia in chronic kidney disease: Secondary | ICD-10-CM | POA: Diagnosis not present

## 2017-08-25 DIAGNOSIS — D509 Iron deficiency anemia, unspecified: Secondary | ICD-10-CM | POA: Diagnosis not present

## 2017-08-25 DIAGNOSIS — Z992 Dependence on renal dialysis: Secondary | ICD-10-CM | POA: Diagnosis not present

## 2017-08-25 DIAGNOSIS — Z23 Encounter for immunization: Secondary | ICD-10-CM | POA: Diagnosis not present

## 2017-08-25 DIAGNOSIS — N2581 Secondary hyperparathyroidism of renal origin: Secondary | ICD-10-CM | POA: Diagnosis not present

## 2017-08-25 DIAGNOSIS — N186 End stage renal disease: Secondary | ICD-10-CM | POA: Diagnosis not present

## 2017-08-25 NOTE — Progress Notes (Signed)
Location:   [facility] Room Number: 112/D Place of Service:  SNF (31) Provider:Arlo,[place]  [name], MD  Patient Care Team: [name], MD as PCP - General (Internal Medicine) Cassandria Anger, MD as Consulting Physician (Endocrinology) Fran Lowes, MD as Consulting Physician (Nephrology) Gala Romney Cristopher Estimable, MD as Consulting Physician (Gastroenterology)  Extended Emergency Contact Information Primary Emergency Contact: Betsey Amen Address: DSS  Johnnette Litter of [place] Phone: [phone] Work Phone: [phone] Relation: Legal Guardian Secondary Emergency Contact: Harlacher,Richard Address: [address]          [place], [place] [address] [place] Phone: [phone] Relation: Brother  Code Status:  Full Code Goals of care: Advanced Directive information Advanced Directives 08/25/2017  Does Patient Have a Medical Advance Directive? Yes  Type of Advance Directive (No Data)  Does patient want to make changes to medical advance directive? No - Patient declined  Copy of [place] in Chart? No - copy requested  Would patient like information on creating a medical advance directive? No - Patient declined  Pre-existing out of facility DNR order (yellow form or pink MOST form) -     Chief Complaint  Patient presents with  . Medical Management of Chronic Issues    Routine Visit    HPI:  Pt is a 71 y.o. male seen today for medical management of chronic diseases.     Past Medical History:  Diagnosis Date  . Abnormal CT scan, kidney 10/06/2011  . Acute pyelonephritis 10/07/2011  . Anemia    normocytic  . Anxiety    mental retardation  . Bladder wall thickening 10/06/2011  . BPH (benign prostatic hypertrophy)   . Diabetes mellitus   . Dialysis patient [facility])    Tuesday, Thursday and Saturday,   . Edema     history of lower extremity edema  . GERD (gastroesophageal reflux disease)    . Heme positive stool   . Hydronephrosis   . Hyperkalemia   . Hyperlipidemia   . Hypernatremia   . Hypertension   . Hypothyroidism   . Impaired speech   . 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 MR (mental retardation)   . Muscle weakness   . Obstructive uropathy   . 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 Poor historian poor historian  . Protein calorie malnutrition ([place])   . Pyelonephritis   . Renal failure (ARF), acute on chronic (HCC)   . Renal insufficiency    chronic history  . Sepsis ([place])   . Smoking   . Uremia   . Urinary retention   . UTI (lower urinary tract infection) 10/06/2011   Past Surgical History:  Procedure Laterality Date  . AV FISTULA PLACEMENT Left 07/06/2015   Procedure:  INSERTION LEFT ARM ARTERIOVENOUS GORTEX GRAFT;  Surgeon: Angelia Mould, MD;  Location: [facility];  Service: Vascular;  Laterality: Left;  . AV FISTULA PLACEMENT Right 02/26/2016   Procedure: ARTERIOVENOUS (AV) FISTULA CREATION ;  Surgeon: Angelia Mould, MD;  Location: [place];  Service: Vascular;  Laterality: Right;  . [place] REMOVAL Left 10/09/2015   Procedure: REMOVAL OF ARTERIOVENOUS GORETEX GRAFT ([place]) Evacuation of Lymphocele, Vein Patch angioplasty of brachial artery.;  Surgeon: Angelia Mould, MD;  Location: [facility];  Service: Vascular;  Laterality: Left;  . BASCILIC VEIN TRANSPOSITION Right 02/26/2016   Procedure: Right BASCILIC VEIN TRANSPOSITION;  Surgeon: Angelia Mould, MD;  Location: [facility];  Service: Vascular;  Laterality: Right;  .  CIRCUMCISION N/A 01/04/2014   Procedure: CIRCUMCISION ADULT (procedure #1);  Surgeon: Marissa Nestle, MD;  Location[facility];  Service: Urology;  Laterality: N/A;  . COLECTOMY N/A 05/04/2017   Procedure: TOTAL COLECTOMY;  Surgeon: Aviva Signs, MD;  Location[facility];  Service: General;  Laterality: N/A;  . COLONOSCOPY N/A 04/27/2017   Procedure: COLONOSCOPY;  Surgeon: Daneil Dolin, MD;  [facility];  Service:  Endoscopy;  Laterality: N/A;  245  . CYSTOSCOPY W/ RETROGRADES Bilateral 06/29/2015   Procedure: CYSTOSCOPY, DILATION OF URETHRAL STRICTURE WITH BILATERAL RETROGRADE PYELOGRAM,SUPRAPUBIC TUBE CHANGE;  Surgeon: Festus Aloe, MD;  [facility];  Service: Urology;  Laterality: Bilateral;  . CYSTOSCOPY WITH URETHRAL DILATATION N/A 12/29/2013   Procedure: CYSTOSCOPY WITH URETHRAL DILATATION;  Surgeon: Marissa Nestle, MD;  [facility];  Service: Urology;  Laterality: N/A;  . ESOPHAGOGASTRODUODENOSCOPY N/A 04/27/2017   Procedure: ESOPHAGOGASTRODUODENOSCOPY (EGD);  Surgeon: Daneil Dolin, MD;  [facility];  Service: Endoscopy;  Laterality: N/A;  . ORIF FEMUR FRACTURE Right 11/22/2016   Procedure: OPEN REDUCTION INTERNAL FIXATION (ORIF) DISTAL FEMUR FRACTURE;  Surgeon: Rod Can, MD;  Location: [facility];  Service: Orthopedics;  Laterality: Right;  . PERIPHERAL VASCULAR CATHETERIZATION N/A 10/08/2015   Procedure: A/V Shuntogram;  Surgeon: Angelia Mould, MD;  Location: [facility];  Service: Cardiovascular;  Laterality: N/A;  . TRANSURETHRAL RESECTION OF PROSTATE N/A 01/04/2014   Procedure: TRANSURETHRAL RESECTION OF THE PROSTATE (TURP) (procedure #2);  Surgeon: Marissa Nestle, MD;  Location: [facility];  Service: Urology;  Laterality: N/A;    No Known Allergies  Outpatient Encounter Prescriptions as of 08/25/2017  Medication Sig  . Amino Acids-Protein Hydrolys (FEEDING SUPPLEMENT, PRO-STAT 64,) LIQD Take 30 mLs by mouth 2 (two) times daily between meals.  Marland Kitchen atorvastatin (LIPITOR) 40 MG tablet Take 40 mg by mouth every evening.  Marland Kitchen levothyroxine (SYNTHROID, LEVOTHROID) 75 MCG tablet Take 75 mcg by mouth daily before breakfast.   . Multiple Vitamin (MULTIVITAMIN WITH MINERALS) TABS tablet Take 1 tablet by mouth every evening.   . Neo-Bacit-Poly-Lidocaine (FIRST AID PLUS LIDOCAINE EX) Apply 1 application topically Every Tuesday,Thursday,and Saturday with dialysis. Prior  to dialysis  . Nutritional Supplements (NEPRO/CARBSTEADY PO) Take 1 Can by mouth 2 (two) times daily.  Marland Kitchen omeprazole (PRILOSEC) 40 MG capsule Take 40 mg by mouth every evening.  . tamsulosin (FLOMAX) 0.4 MG CAPS capsule Take 0.4 mg by mouth daily at 8 pm. Give 30 minutes after a meal  . torsemide (DEMADEX) 10 MG tablet Take 5 tablets (50 mg total) by mouth daily.   No facility-administered encounter medications on file as of 08/25/2017.      Review of Systems  Immunization History  Administered Date(s) Administered  . Influenza,inj,Quad PF,6+ Mos 12/26/2013, 07/09/2015  . Influenza-Unspecified 07/31/2017  . PPD Test 03/07/2015  . Pneumococcal Conjugate-13 07/31/2017  . Pneumococcal Polysaccharide-23 03/04/2015   Pertinent  Health Maintenance Due  Topic Date Due  . OPHTHALMOLOGY EXAM  08/23/1956  . URINE MICROALBUMIN  08/23/1958  . HEMOGLOBIN A1C  11/28/2017  . FOOT EXAM  07/22/2018  . COLONOSCOPY  04/28/2027  . INFLUENZA VACCINE  Completed  . PNA vac Low Risk Adult  Completed   No flowsheet data found. Functional Status Survey:    Vitals:   08/25/17 1512  BP: 129/74  Pulse: (!) 55  Resp: 18  Temp: 97.6 F (36.4 C)  TempSrc: Oral  SpO2: 97%  Weight: 182 lb 9.6 oz (82.8 kg)  Height: 5\' 8"  (1.727 m)   Body mass index is 27.76 kg/m. Physical Exam  Labs reviewed:  Recent Labs  06/07/17 0800 06/08/17 0519 06/10/17 0727 06/15/17 0630 06/22/17 0700 07/13/17 0700  NA 135 134* 137 133* 133* 136  K 3.1* 3.5 3.2* 3.0* 4.0 4.1  CL 97* 97* 100* 102 98* 100*  CO2 27 27 26  21* 24 26  GLUCOSE 100* 96 146* 226* 189* 146*  BUN 43* 50* 34* 38* 43* 43*  CREATININE 5.53* 5.68* 4.96* 5.13* 4.72* 6.50*  CALCIUM 7.9* 8.0* 8.4* 8.6* 8.6* 8.7*  PHOS 5.3* 5.1* 3.7  --   --   --     Recent Labs  05/17/17 1558 05/18/17 1026  06/03/17 0055  06/07/17 0800 06/08/17 0519 06/10/17 0727  AST 36 29  --  16  --   --   --   --   ALT 17 15*  --  15*  --   --   --   --     ALKPHOS 102 92  --  87  --   --   --   --   BILITOT 1.0 0.9  --  0.5  --   --   --   --   PROT 5.9* 5.3*  --  6.5  --   --   --   --   ALBUMIN 2.3* 2.0*  < > 2.4*  < > 2.0* 2.0* 2.1*  < > = values in this interval not displayed.  Recent Labs  06/03/17 0055  06/10/17 0727 06/15/17 0630 07/13/17 0700  WBC 6.6  < > 8.1 8.6 7.1  NEUTROABS 4.0  --   --  5.8 4.1  HGB 5.8*  < > 9.3* 9.8* 12.6*  HCT 18.4*  < > 28.9* 30.2* 39.6  MCV 95.8  < > 90.9 92.1 100.3*  PLT 280  < > 323 313 181  < > = values in this interval not displayed. Lab Results  Component Value Date   TSH 3.527 05/26/2017   Lab Results  Component Value Date   HGBA1C 5.4 05/28/2017   Lab Results  Component Value Date   CHOL 104 07/13/2017   HDL 34 (L) 07/13/2017   LDLCALC 28 07/13/2017   TRIG 208 (H) 07/13/2017   CHOLHDL 3.1 07/13/2017    Significant Diagnostic Results in last 30 days:  No results found.  Assessment/Plan There are no diagnoses linked to this encounter.   Family/ staff Communication:   Labs/tests ordered:       This encounter was created in error - please disregard.

## 2017-08-26 DIAGNOSIS — Z992 Dependence on renal dialysis: Secondary | ICD-10-CM | POA: Diagnosis not present

## 2017-08-26 DIAGNOSIS — N186 End stage renal disease: Secondary | ICD-10-CM | POA: Diagnosis not present

## 2017-08-27 DIAGNOSIS — Z992 Dependence on renal dialysis: Secondary | ICD-10-CM | POA: Diagnosis not present

## 2017-08-27 DIAGNOSIS — Z23 Encounter for immunization: Secondary | ICD-10-CM | POA: Diagnosis not present

## 2017-08-27 DIAGNOSIS — D509 Iron deficiency anemia, unspecified: Secondary | ICD-10-CM | POA: Diagnosis not present

## 2017-08-27 DIAGNOSIS — N186 End stage renal disease: Secondary | ICD-10-CM | POA: Diagnosis not present

## 2017-08-29 DIAGNOSIS — D509 Iron deficiency anemia, unspecified: Secondary | ICD-10-CM | POA: Diagnosis not present

## 2017-08-29 DIAGNOSIS — Z992 Dependence on renal dialysis: Secondary | ICD-10-CM | POA: Diagnosis not present

## 2017-08-29 DIAGNOSIS — Z23 Encounter for immunization: Secondary | ICD-10-CM | POA: Diagnosis not present

## 2017-08-29 DIAGNOSIS — N186 End stage renal disease: Secondary | ICD-10-CM | POA: Diagnosis not present

## 2017-08-31 ENCOUNTER — Non-Acute Institutional Stay (SKILLED_NURSING_FACILITY): Payer: Medicare Other | Admitting: Internal Medicine

## 2017-08-31 ENCOUNTER — Encounter: Payer: Self-pay | Admitting: Internal Medicine

## 2017-08-31 DIAGNOSIS — E1121 Type 2 diabetes mellitus with diabetic nephropathy: Secondary | ICD-10-CM | POA: Diagnosis not present

## 2017-08-31 DIAGNOSIS — N186 End stage renal disease: Secondary | ICD-10-CM

## 2017-08-31 DIAGNOSIS — D509 Iron deficiency anemia, unspecified: Secondary | ICD-10-CM | POA: Diagnosis not present

## 2017-08-31 DIAGNOSIS — E7849 Other hyperlipidemia: Secondary | ICD-10-CM | POA: Diagnosis not present

## 2017-08-31 DIAGNOSIS — N401 Enlarged prostate with lower urinary tract symptoms: Secondary | ICD-10-CM | POA: Diagnosis not present

## 2017-08-31 DIAGNOSIS — Z9049 Acquired absence of other specified parts of digestive tract: Secondary | ICD-10-CM | POA: Diagnosis not present

## 2017-08-31 DIAGNOSIS — R338 Other retention of urine: Secondary | ICD-10-CM | POA: Diagnosis not present

## 2017-08-31 DIAGNOSIS — I82401 Acute embolism and thrombosis of unspecified deep veins of right lower extremity: Secondary | ICD-10-CM | POA: Diagnosis not present

## 2017-08-31 NOTE — Progress Notes (Signed)
Location:   [facility] Room Number: 112/D Place of Service:  SNF (31) Provider:  Geralyn Flash, MD  Patient Care Team: [name], MD as PCP - General (Internal Medicine) Cassandria Anger, MD as Consulting Physician (Endocrinology) Fran Lowes, MD as Consulting Physician (Nephrology) Gala Romney Cristopher Estimable, MD as Consulting Physician (Gastroenterology)  Extended Emergency Contact Information Primary Emergency Contact: Betsey Amen Address: DSS  Johnnette [place] of [place] Phone: [phone] Work Phone: [phone] Relation: Legal Guardian Secondary Emergency Contact: Scovill,Richard Address: [address]          [place], [place] [address] [place] Phone: [phone] Relation: Brother  Code Status:  Full Code Goals of care: Advanced Directive information Advanced Directives 08/25/2017  Does Patient Have a Medical Advance Directive? Yes  Type of Advance Directive (No Data)  Does patient want to make changes to medical advance directive? No - Patient declined  Copy of [place] in Chart? No - copy requested  Would patient like information on creating a medical advance directive? No - Patient declined  Pre-existing out of facility DNR order (yellow form or pink MOST form) -     Chief Complaint  Patient presents with  . Medical Management of Chronic Issues    Routine Visit    HPI:  Pt is a 71 y.o. male seen today for medical management of chronic diseases.   Patient has H/O ESRD on dialysis TTS, Type 2 Diabetes, Hypothyroidism, anemia and Mild Mental delay. H/O Right Distal Fracture S/P ORIF, Anemia, GI bleed s/p Colectomy in 05/04/17 and DVT  Patient doing well in facility. No New Nursing issues. His weight is now 182 pounds which is gain of 10 lbs since he had colectomy done. His appetite has improved and he is eating better. He denies any problems.     Past Medical History:   Diagnosis Date  . Abnormal CT scan, kidney 10/06/2011  . Acute pyelonephritis 10/07/2011  . Anemia    normocytic  . Anxiety    mental retardation  . Bladder wall thickening 10/06/2011  . BPH (benign prostatic hypertrophy)   . Diabetes mellitus   . Dialysis patient [facility])    Tuesday, Thursday and Saturday,   . Edema     history of lower extremity edema  . GERD (gastroesophageal reflux disease)   . Heme positive stool   . Hydronephrosis   . Hyperkalemia   . Hyperlipidemia   . Hypernatremia   . Hypertension   . Hypothyroidism   . Impaired speech   . 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 MR (mental retardation)   . Muscle weakness   . Obstructive uropathy   . 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 Poor historian poor historian  . Protein calorie malnutrition ([place])   . Pyelonephritis   . Renal failure (ARF), acute on chronic (HCC)   . Renal insufficiency    chronic history  . Sepsis ([place])   . Smoking   . Uremia   . Urinary retention   . UTI (lower urinary tract infection) 10/06/2011   History reviewed. No pertinent surgical history.  No Known Allergies  Outpatient Encounter Medications as of 08/31/2017  Medication Sig  . Amino Acids-Protein Hydrolys (FEEDING SUPPLEMENT, PRO-STAT 64,) LIQD Take 30 mLs by mouth 2 (two) times daily between meals.  Marland Kitchen atorvastatin (LIPITOR) 40 MG tablet Take 40 mg by mouth every evening.  Marland Kitchen levothyroxine (SYNTHROID, LEVOTHROID) 75 MCG tablet Take 75 mcg by mouth  daily before breakfast.   . Multiple Vitamin (MULTIVITAMIN WITH MINERALS) TABS tablet Take 1 tablet by mouth every evening.   . Neo-Bacit-Poly-Lidocaine (FIRST AID PLUS LIDOCAINE EX) Apply 1 application topically Every Tuesday,Thursday,and Saturday with dialysis. Prior to dialysis  . Nutritional Supplements (NEPRO/CARBSTEADY PO) Take 1 Can by mouth 2 (two) times daily.  Marland Kitchen omeprazole (PRILOSEC) 40 MG capsule Take 40 mg by mouth every evening.  . tamsulosin (FLOMAX) 0.4 MG CAPS  capsule Take 0.4 mg by mouth daily at 8 pm. Give 30 minutes after a meal  . torsemide (DEMADEX) 10 MG tablet Take 5 tablets (50 mg total) by mouth daily.   No facility-administered encounter medications on file as of 08/31/2017.      Review of Systems Review of Systems  Constitutional: Negative for activity change, appetite change, chills, diaphoresis, fatigue and fever.  HENT: Negative for mouth sores, postnasal drip, rhinorrhea, sinus pain and sore throat.   Respiratory: Negative for apnea, cough, chest tightness, shortness of breath and wheezing.   Cardiovascular: Negative for chest pain, palpitations and leg swelling.  Gastrointestinal: Negative for abdominal distention, abdominal pain, constipation, diarrhea, nausea and vomiting.  Genitourinary: Negative for dysuria and frequency.  Musculoskeletal: Negative for arthralgias, joint swelling and myalgias.  Skin: Negative for rash.  Neurological: Negative for dizziness, syncope, weakness, light-headedness and numbness.  Psychiatric/Behavioral: Negative for behavioral problems, confusion and sleep disturbance.    Immunization History  Administered Date(s) Administered  . Influenza,inj,Quad PF,6+ Mos 12/26/2013, 07/09/2015  . Influenza-Unspecified 07/31/2017  . PPD Test 03/07/2015  . Pneumococcal Conjugate-13 07/31/2017  . Pneumococcal Polysaccharide-23 03/04/2015   Pertinent  Health Maintenance Due  Topic Date Due  . OPHTHALMOLOGY EXAM  08/23/1956  . URINE MICROALBUMIN  [birth year]1958  . HEMOGLOBIN A1C  11/28/2017  . FOOT EXAM  07/22/2018  . COLONOSCOPY  04/28/2027  . INFLUENZA VACCINE  Completed  . PNA vac Low Risk Adult  Completed   No flowsheet data found. Functional Status Survey:    Vitals:   08/31/17 0915  BP: 95/63  Pulse: 86  Resp: 18  Temp: 98.4 F (36.9 C)  TempSrc: Oral  SpO2: 100%  Weight: 182 lb 9.6 oz (82.8 kg)  Height: 5\' 8"  (1.727 m)   Body mass index is 27.76 kg/m. Physical Exam  Constitutional:  He appears well-developed and well-nourished.  HENT:  Head: Normocephalic.  Mouth/Throat: Oropharynx is clear and moist.  Eyes: Pupils are equal, round, and reactive to light.  Neck: Neck supple.  Cardiovascular: Normal rate and normal heart sounds.  Pulmonary/Chest: Effort normal and breath sounds normal. No respiratory distress. He has no wheezes. He has no rales.  Abdominal: Soft. Bowel sounds are normal. He exhibits no distension. There is no tenderness. There is no rebound.  Musculoskeletal:  Trace edema Bilateral.  Neurological: He is alert.  Skin: Skin is warm and dry.  Psychiatric: He has a normal mood and affect. His behavior is normal. Thought content normal.    Labs reviewed: Recent Labs    06/07/17 0800 06/08/17 0519 06/10/17 0727 06/15/17 0630 06/22/17 0700 07/13/17 0700  NA 135 134* 137 133* 133* 136  K 3.1* 3.5 3.2* 3.0* 4.0 4.1  CL 97* 97* 100* 102 98* 100*  CO2 27 27 26  21* 24 26  GLUCOSE 100* 96 146* 226* 189* 146*  BUN 43* 50* 34* 38* 43* 43*  CREATININE 5.53* 5.68* 4.96* 5.13* 4.72* 6.50*  CALCIUM 7.9* 8.0* 8.4* 8.6* 8.6* 8.7*  PHOS 5.3* 5.1* 3.7  --   --   --  Recent Labs    05/17/17 1558 05/18/17 1026  06/03/17 0055  06/07/17 0800 06/08/17 0519 06/10/17 0727  AST 36 29  --  16  --   --   --   --   ALT 17 15*  --  15*  --   --   --   --   ALKPHOS 102 92  --  87  --   --   --   --   BILITOT 1.0 0.9  --  0.5  --   --   --   --   PROT 5.9* 5.3*  --  6.5  --   --   --   --   ALBUMIN 2.3* 2.0*   < > 2.4*   < > 2.0* 2.0* 2.1*   < > = values in this interval not displayed.   Recent Labs    06/03/17 0055  06/10/17 0727 06/15/17 0630 07/13/17 0700  WBC 6.6   < > 8.1 8.6 7.1  NEUTROABS 4.0  --   --  5.8 4.1  HGB 5.8*   < > 9.3* 9.8* 12.6*  HCT 18.4*   < > 28.9* 30.2* 39.6  MCV 95.8   < > 90.9 92.1 100.3*  PLT 280   < > 323 313 181   < > = values in this interval not displayed.   Lab Results  Component Value Date   TSH 3.527 05/26/2017    Lab Results  Component Value Date   HGBA1C 5.4 05/28/2017   Lab Results  Component Value Date   CHOL 104 07/13/2017   HDL 34 (L) 07/13/2017   LDLCALC 28 07/13/2017   TRIG 208 (H) 07/13/2017   CHOLHDL 3.1 07/13/2017    Significant Diagnostic Results in last 30 days:  No results found.  Assessment/Plan  ESRD (end stage renal disease)  Stable and doing well. Also on Demadex   Benign prostatic hyperplasia with urinary retention On Flomax   Type 2 diabetes mellitus with diabetic nephropathy, Fasting BS are running more then 200. Will restart his Tradjenta Repeat A1C  S/P colectomy Appetite has improved and is doing well with his weight  Hyperlipidemia LDL 28 in 09/18 On statin  Iron deficiency anemia, On Nepro Hgb Stable Hypothyroid Continue same dose of Synthroid  TSH normal in 07/18  Family/ staff Communication:   Labs/tests ordered:    BMP, CBC, TSH,A1C

## 2017-09-01 ENCOUNTER — Encounter (HOSPITAL_COMMUNITY)
Admission: RE | Admit: 2017-09-01 | Discharge: 2017-09-01 | Disposition: A | Discharge status: Home / Self Care | Payer: Medicare Other | Source: Skilled Nursing Facility | Attending: Internal Medicine | Admitting: Internal Medicine

## 2017-09-01 DIAGNOSIS — D500 Iron deficiency anemia secondary to blood loss (chronic): Secondary | ICD-10-CM | POA: Diagnosis not present

## 2017-09-01 DIAGNOSIS — E43 Unspecified severe protein-calorie malnutrition: Secondary | ICD-10-CM | POA: Diagnosis not present

## 2017-09-01 DIAGNOSIS — Z7901 Long term (current) use of anticoagulants: Secondary | ICD-10-CM | POA: Diagnosis not present

## 2017-09-01 DIAGNOSIS — Z992 Dependence on renal dialysis: Secondary | ICD-10-CM | POA: Diagnosis not present

## 2017-09-01 DIAGNOSIS — E119 Type 2 diabetes mellitus without complications: Secondary | ICD-10-CM | POA: Diagnosis not present

## 2017-09-01 DIAGNOSIS — Z9049 Acquired absence of other specified parts of digestive tract: Secondary | ICD-10-CM | POA: Diagnosis not present

## 2017-09-01 DIAGNOSIS — D509 Iron deficiency anemia, unspecified: Secondary | ICD-10-CM | POA: Diagnosis not present

## 2017-09-01 DIAGNOSIS — N186 End stage renal disease: Secondary | ICD-10-CM | POA: Diagnosis not present

## 2017-09-01 DIAGNOSIS — Z23 Encounter for immunization: Secondary | ICD-10-CM | POA: Diagnosis not present

## 2017-09-01 LAB — BASIC METABOLIC PANEL
Anion gap: 13 (ref 5–15)
BUN: 108 mg/dL — ABNORMAL HIGH (ref 6–20)
CO2: 20 mmol/L — ABNORMAL LOW (ref 22–32)
Calcium: 8.4 mg/dL — ABNORMAL LOW (ref 8.9–10.3)
Chloride: 105 mmol/L (ref 101–111)
Creatinine, Ser: 8.56 mg/dL — ABNORMAL HIGH (ref 0.61–1.24)
GFR calc Af Amer: 6 mL/min — ABNORMAL LOW (ref >60)
GFR calc non Af Amer: 6 mL/min — ABNORMAL LOW (ref >60)
Glucose, Bld: 196 mg/dL — ABNORMAL HIGH (ref 65–99)
Potassium: 4.2 mmol/L (ref 3.5–5.1)
Sodium: 138 mmol/L (ref 135–145)

## 2017-09-01 LAB — CBC WITH DIFFERENTIAL/PLATELET
Basophils Absolute: 0 10*3/uL (ref 0.0–0.1)
Basophils Relative: 0 %
Eosinophils Absolute: 0.2 10*3/uL (ref 0.0–0.7)
Eosinophils Relative: 3 %
HCT: 35.7 % — ABNORMAL LOW (ref 39.0–52.0)
Hemoglobin: 11.7 g/dL — ABNORMAL LOW (ref 13.0–17.0)
Lymphocytes Relative: 33 %
Lymphs Abs: 1.9 10*3/uL (ref 0.7–4.0)
MCH: 32.9 pg (ref 26.0–34.0)
MCHC: 32.8 g/dL (ref 30.0–36.0)
MCV: 100.3 fL — ABNORMAL HIGH (ref 78.0–100.0)
Monocytes Absolute: 0.5 10*3/uL (ref 0.1–1.0)
Monocytes Relative: 8 %
Neutro Abs: 3.3 10*3/uL (ref 1.7–7.7)
Neutrophils Relative %: 56 %
Platelets: 149 10*3/uL — ABNORMAL LOW (ref 150–400)
RBC: 3.56 MIL/uL — ABNORMAL LOW (ref 4.22–5.81)
RDW: 16.2 % — ABNORMAL HIGH (ref 11.5–15.5)
WBC: 5.8 10*3/uL (ref 4.0–10.5)

## 2017-09-01 LAB — HEMOGLOBIN A1C
Hgb A1c MFr Bld: 6.8 % — ABNORMAL HIGH (ref 4.8–5.6)
Mean Plasma Glucose: 148.46 mg/dL

## 2017-09-01 LAB — TSH: TSH: 1.585 u[IU]/mL (ref 0.350–4.500)

## 2017-09-03 DIAGNOSIS — D509 Iron deficiency anemia, unspecified: Secondary | ICD-10-CM | POA: Diagnosis not present

## 2017-09-03 DIAGNOSIS — N186 End stage renal disease: Secondary | ICD-10-CM | POA: Diagnosis not present

## 2017-09-03 DIAGNOSIS — Z23 Encounter for immunization: Secondary | ICD-10-CM | POA: Diagnosis not present

## 2017-09-03 DIAGNOSIS — Z992 Dependence on renal dialysis: Secondary | ICD-10-CM | POA: Diagnosis not present

## 2017-09-05 DIAGNOSIS — Z992 Dependence on renal dialysis: Secondary | ICD-10-CM | POA: Diagnosis not present

## 2017-09-05 DIAGNOSIS — D509 Iron deficiency anemia, unspecified: Secondary | ICD-10-CM | POA: Diagnosis not present

## 2017-09-05 DIAGNOSIS — N186 End stage renal disease: Secondary | ICD-10-CM | POA: Diagnosis not present

## 2017-09-05 DIAGNOSIS — Z23 Encounter for immunization: Secondary | ICD-10-CM | POA: Diagnosis not present

## 2017-09-07 DIAGNOSIS — F0631 Mood disorder due to known physiological condition with depressive features: Secondary | ICD-10-CM | POA: Diagnosis not present

## 2017-09-08 DIAGNOSIS — N186 End stage renal disease: Secondary | ICD-10-CM | POA: Diagnosis not present

## 2017-09-08 DIAGNOSIS — D509 Iron deficiency anemia, unspecified: Secondary | ICD-10-CM | POA: Diagnosis not present

## 2017-09-08 DIAGNOSIS — Z23 Encounter for immunization: Secondary | ICD-10-CM | POA: Diagnosis not present

## 2017-09-08 DIAGNOSIS — Z992 Dependence on renal dialysis: Secondary | ICD-10-CM | POA: Diagnosis not present

## 2017-09-10 DIAGNOSIS — Z23 Encounter for immunization: Secondary | ICD-10-CM | POA: Diagnosis not present

## 2017-09-10 DIAGNOSIS — Z992 Dependence on renal dialysis: Secondary | ICD-10-CM | POA: Diagnosis not present

## 2017-09-10 DIAGNOSIS — D509 Iron deficiency anemia, unspecified: Secondary | ICD-10-CM | POA: Diagnosis not present

## 2017-09-10 DIAGNOSIS — N186 End stage renal disease: Secondary | ICD-10-CM | POA: Diagnosis not present

## 2017-09-12 DIAGNOSIS — Z23 Encounter for immunization: Secondary | ICD-10-CM | POA: Diagnosis not present

## 2017-09-12 DIAGNOSIS — N186 End stage renal disease: Secondary | ICD-10-CM | POA: Diagnosis not present

## 2017-09-12 DIAGNOSIS — Z992 Dependence on renal dialysis: Secondary | ICD-10-CM | POA: Diagnosis not present

## 2017-09-12 DIAGNOSIS — D509 Iron deficiency anemia, unspecified: Secondary | ICD-10-CM | POA: Diagnosis not present

## 2017-09-14 DIAGNOSIS — F0631 Mood disorder due to known physiological condition with depressive features: Secondary | ICD-10-CM | POA: Diagnosis not present

## 2017-09-15 DIAGNOSIS — Z23 Encounter for immunization: Secondary | ICD-10-CM | POA: Diagnosis not present

## 2017-09-15 DIAGNOSIS — Z992 Dependence on renal dialysis: Secondary | ICD-10-CM | POA: Diagnosis not present

## 2017-09-15 DIAGNOSIS — D509 Iron deficiency anemia, unspecified: Secondary | ICD-10-CM | POA: Diagnosis not present

## 2017-09-15 DIAGNOSIS — N186 End stage renal disease: Secondary | ICD-10-CM | POA: Diagnosis not present

## 2017-09-17 DIAGNOSIS — D509 Iron deficiency anemia, unspecified: Secondary | ICD-10-CM | POA: Diagnosis not present

## 2017-09-17 DIAGNOSIS — N186 End stage renal disease: Secondary | ICD-10-CM | POA: Diagnosis not present

## 2017-09-17 DIAGNOSIS — Z23 Encounter for immunization: Secondary | ICD-10-CM | POA: Diagnosis not present

## 2017-09-17 DIAGNOSIS — Z992 Dependence on renal dialysis: Secondary | ICD-10-CM | POA: Diagnosis not present

## 2017-09-19 DIAGNOSIS — D509 Iron deficiency anemia, unspecified: Secondary | ICD-10-CM | POA: Diagnosis not present

## 2017-09-19 DIAGNOSIS — Z23 Encounter for immunization: Secondary | ICD-10-CM | POA: Diagnosis not present

## 2017-09-19 DIAGNOSIS — Z992 Dependence on renal dialysis: Secondary | ICD-10-CM | POA: Diagnosis not present

## 2017-09-19 DIAGNOSIS — N186 End stage renal disease: Secondary | ICD-10-CM | POA: Diagnosis not present

## 2017-09-21 DIAGNOSIS — F0631 Mood disorder due to known physiological condition with depressive features: Secondary | ICD-10-CM | POA: Diagnosis not present

## 2017-09-22 DIAGNOSIS — Z23 Encounter for immunization: Secondary | ICD-10-CM | POA: Diagnosis not present

## 2017-09-22 DIAGNOSIS — D509 Iron deficiency anemia, unspecified: Secondary | ICD-10-CM | POA: Diagnosis not present

## 2017-09-22 DIAGNOSIS — N186 End stage renal disease: Secondary | ICD-10-CM | POA: Diagnosis not present

## 2017-09-22 DIAGNOSIS — Z992 Dependence on renal dialysis: Secondary | ICD-10-CM | POA: Diagnosis not present

## 2017-09-24 DIAGNOSIS — Z992 Dependence on renal dialysis: Secondary | ICD-10-CM | POA: Diagnosis not present

## 2017-09-24 DIAGNOSIS — Z23 Encounter for immunization: Secondary | ICD-10-CM | POA: Diagnosis not present

## 2017-09-24 DIAGNOSIS — N186 End stage renal disease: Secondary | ICD-10-CM | POA: Diagnosis not present

## 2017-09-24 DIAGNOSIS — D509 Iron deficiency anemia, unspecified: Secondary | ICD-10-CM | POA: Diagnosis not present

## 2017-09-25 DIAGNOSIS — Z992 Dependence on renal dialysis: Secondary | ICD-10-CM | POA: Diagnosis not present

## 2017-09-25 DIAGNOSIS — N186 End stage renal disease: Secondary | ICD-10-CM | POA: Diagnosis not present

## 2017-09-26 DIAGNOSIS — D509 Iron deficiency anemia, unspecified: Secondary | ICD-10-CM | POA: Diagnosis not present

## 2017-09-26 DIAGNOSIS — N186 End stage renal disease: Secondary | ICD-10-CM | POA: Diagnosis not present

## 2017-09-26 DIAGNOSIS — Z992 Dependence on renal dialysis: Secondary | ICD-10-CM | POA: Diagnosis not present

## 2017-09-29 DIAGNOSIS — D509 Iron deficiency anemia, unspecified: Secondary | ICD-10-CM | POA: Diagnosis not present

## 2017-09-29 DIAGNOSIS — Z992 Dependence on renal dialysis: Secondary | ICD-10-CM | POA: Diagnosis not present

## 2017-09-29 DIAGNOSIS — N186 End stage renal disease: Secondary | ICD-10-CM | POA: Diagnosis not present

## 2017-10-01 DIAGNOSIS — D509 Iron deficiency anemia, unspecified: Secondary | ICD-10-CM | POA: Diagnosis not present

## 2017-10-01 DIAGNOSIS — N186 End stage renal disease: Secondary | ICD-10-CM | POA: Diagnosis not present

## 2017-10-01 DIAGNOSIS — Z992 Dependence on renal dialysis: Secondary | ICD-10-CM | POA: Diagnosis not present

## 2017-10-03 DIAGNOSIS — D509 Iron deficiency anemia, unspecified: Secondary | ICD-10-CM | POA: Diagnosis not present

## 2017-10-03 DIAGNOSIS — N186 End stage renal disease: Secondary | ICD-10-CM | POA: Diagnosis not present

## 2017-10-03 DIAGNOSIS — Z992 Dependence on renal dialysis: Secondary | ICD-10-CM | POA: Diagnosis not present

## 2017-10-06 DIAGNOSIS — D509 Iron deficiency anemia, unspecified: Secondary | ICD-10-CM | POA: Diagnosis not present

## 2017-10-06 DIAGNOSIS — F0631 Mood disorder due to known physiological condition with depressive features: Secondary | ICD-10-CM | POA: Diagnosis not present

## 2017-10-06 DIAGNOSIS — Z992 Dependence on renal dialysis: Secondary | ICD-10-CM | POA: Diagnosis not present

## 2017-10-06 DIAGNOSIS — N186 End stage renal disease: Secondary | ICD-10-CM | POA: Diagnosis not present

## 2017-10-08 DIAGNOSIS — D509 Iron deficiency anemia, unspecified: Secondary | ICD-10-CM | POA: Diagnosis not present

## 2017-10-08 DIAGNOSIS — N186 End stage renal disease: Secondary | ICD-10-CM | POA: Diagnosis not present

## 2017-10-08 DIAGNOSIS — Z992 Dependence on renal dialysis: Secondary | ICD-10-CM | POA: Diagnosis not present

## 2017-10-10 DIAGNOSIS — N186 End stage renal disease: Secondary | ICD-10-CM | POA: Diagnosis not present

## 2017-10-10 DIAGNOSIS — Z992 Dependence on renal dialysis: Secondary | ICD-10-CM | POA: Diagnosis not present

## 2017-10-10 DIAGNOSIS — D509 Iron deficiency anemia, unspecified: Secondary | ICD-10-CM | POA: Diagnosis not present

## 2017-10-13 DIAGNOSIS — F0631 Mood disorder due to known physiological condition with depressive features: Secondary | ICD-10-CM | POA: Diagnosis not present

## 2017-10-13 DIAGNOSIS — D509 Iron deficiency anemia, unspecified: Secondary | ICD-10-CM | POA: Diagnosis not present

## 2017-10-13 DIAGNOSIS — Z992 Dependence on renal dialysis: Secondary | ICD-10-CM | POA: Diagnosis not present

## 2017-10-13 DIAGNOSIS — N186 End stage renal disease: Secondary | ICD-10-CM | POA: Diagnosis not present

## 2017-10-14 ENCOUNTER — Encounter: Payer: Self-pay | Admitting: Internal Medicine

## 2017-10-14 ENCOUNTER — Non-Acute Institutional Stay (SKILLED_NURSING_FACILITY): Payer: Medicare Other | Admitting: Internal Medicine

## 2017-10-14 DIAGNOSIS — E7849 Other hyperlipidemia: Secondary | ICD-10-CM | POA: Diagnosis not present

## 2017-10-14 DIAGNOSIS — E039 Hypothyroidism, unspecified: Secondary | ICD-10-CM

## 2017-10-14 DIAGNOSIS — K922 Gastrointestinal hemorrhage, unspecified: Secondary | ICD-10-CM | POA: Diagnosis not present

## 2017-10-14 DIAGNOSIS — E1121 Type 2 diabetes mellitus with diabetic nephropathy: Secondary | ICD-10-CM | POA: Diagnosis not present

## 2017-10-14 DIAGNOSIS — N186 End stage renal disease: Secondary | ICD-10-CM

## 2017-10-14 DIAGNOSIS — N139 Obstructive and reflux uropathy, unspecified: Secondary | ICD-10-CM | POA: Diagnosis not present

## 2017-10-14 NOTE — Progress Notes (Signed)
Location:   [place] Room Number: 112/D Place of Service:  SNF (31) Provider:  Glennon Hamilton, MD  Patient Care Team: [name], MD as PCP - General (Internal Medicine) Cassandria Anger, MD as Consulting Physician (Endocrinology) Fran Lowes, MD as Consulting Physician (Nephrology) Gala Romney Cristopher Estimable, MD as Consulting Physician (Gastroenterology)  Extended Emergency Contact Information Primary Emergency Contact: Betsey Amen Address: [address]  [place] Phone: [phone] Work Phone: [phone] Relation: Legal Guardian Secondary Emergency Contact: Koppel,Richard Address: [address]          [place], [place] [address] [place] Phone: [phone] Relation: Brother  Code Status:  Full Code Goals of care: Advanced Directive information Advanced Directives 10/14/2017  Does Patient Have a Medical Advance Directive? Yes  Type of Advance Directive (No Data)  Does patient want to make changes to medical advance directive? No - Patient declined  Copy of [place] in Chart? No - copy requested  Would patient like information on creating a medical advance directive? No - Patient declined  Pre-existing out of facility DNR order (yellow form or pink MOST form) -     Chief complaint-routine visit for medical management of chronic medical conditions including end-stage renal disease on dialysis-diabetes type 2- anemia with history of GI bleed-hypothyroidism- history of BPH-hyperlipidemia with  HPI:  Pt is a 71 y.o. male seen today for medical management of chronic diseases.  As noted above.  He continues to be quite stable.  He does have end-stage renal disease on chronic hemodialysis and appears to be tolerating this well  Back in July 2018 he did have a GI bleed and required a colectomy at one point-he is made a really good recovery here he is appetite is now back he is  gaining weight.  Hemoglobin which at one point was in the sevens has rebounded up to 11.7 on lab done in November he is monitored by dialysis as well but he appears to be back at baseline.  He does have a history of type 2 diabetes this appears well controlled Tradjenta was recently started secondary to some higher readings and this appears to be beneficial CBGs are largely in the mid to lower 100s hemoglobin A1c was 6.8 back in November.  Regards to hypothyroidism he is on Synthroid TSH was 1.585 back in November.  At times he does have some asymptomatic hypotension and this is been somewhat of a chronic issue--blood pressure earlier today was 124/56- later I got 96/60-this is not really new he is asymptomatic.  He does have a history of mild mental delay but he is doing well with supportive care.  In regards to right lower extremity DVT when he had the GI bleed this summer his Lovenox was stopped secondary to believe he had finished duration of treatment for his right lower extremity DVT  He also has a history of a right femoral fracture that was surgically repaired actually this was the original reason he came to skilled nursing.  He largely ambulates in a wheelchair  Tonight he has no acute complaints vital signs are stable again blood pressure is somewhat low but this is not new    .     Past Medical History:  Diagnosis Date  . Abnormal CT scan, kidney 10/06/2011  . Acute pyelonephritis 10/07/2011  . Anemia    normocytic  . Anxiety    mental retardation  . Bladder wall thickening 10/06/2011  .  BPH (benign prostatic hypertrophy)   . Diabetes mellitus   . Dialysis patient [facility])    Tuesday, Thursday and Saturday,   . Edema     history of lower extremity edema  . GERD (gastroesophageal reflux disease)   . Heme positive stool   . Hydronephrosis   . Hyperkalemia   . Hyperlipidemia   . Hypernatremia   . Hypertension   . Hypothyroidism   . Impaired speech   . 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 MR (mental retardation)   . Muscle weakness   . Obstructive uropathy   . 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 Poor historian poor historian  . Protein calorie malnutrition ([place])   . Pyelonephritis   . Renal failure (ARF), acute on chronic (HCC)   . Renal insufficiency    chronic history  . Sepsis ([place])   . Smoking   . Uremia   . Urinary retention   . UTI (lower urinary tract infection) 10/06/2011   Past Surgical History:  Procedure Laterality Date  . AV FISTULA PLACEMENT Left 07/06/2015   Procedure:  INSERTION LEFT ARM ARTERIOVENOUS GORTEX GRAFT;  Surgeon: Angelia Mould, MD;  Location: [place];  Service: Vascular;  Laterality: Left;  . AV FISTULA PLACEMENT Right 02/26/2016   Procedure: ARTERIOVENOUS (AV) FISTULA CREATION ;  Surgeon: Angelia Mould, MD;  Location: [place];  Service: Vascular;  Laterality: Right;  . [place] REMOVAL Left 10/09/2015   Procedure: REMOVAL OF ARTERIOVENOUS GORETEX GRAFT ([place]) Evacuation of Lymphocele, Vein Patch angioplasty of brachial artery.;  Surgeon: Angelia Mould, MD;  Location: [place];  Service: Vascular;  Laterality: Left;  . BASCILIC VEIN TRANSPOSITION Right 02/26/2016   Procedure: Right BASCILIC VEIN TRANSPOSITION;  Surgeon: Angelia Mould, MD;  Location: [place];  Service: Vascular;  Laterality: Right;  . CIRCUMCISION N/A 01/04/2014   Procedure: CIRCUMCISION ADULT (procedure #1);  Surgeon: Marissa Nestle, MD;  Location[facility];  Service: Urology;  Laterality: N/A;  . COLECTOMY N/A 05/04/2017   Procedure: TOTAL COLECTOMY;  Surgeon: Aviva Signs, MD;  Location: [facility];  Service: General;  Laterality: N/A;  . COLONOSCOPY N/A 04/27/2017   Procedure: COLONOSCOPY;  Surgeon: Daneil Dolin, MD;  [facility];  Service: Endoscopy;  Laterality: N/A;  245  . CYSTOSCOPY W/ RETROGRADES Bilateral 06/29/2015   Procedure: CYSTOSCOPY, DILATION OF URETHRAL STRICTURE WITH BILATERAL RETROGRADE  PYELOGRAM,SUPRAPUBIC TUBE CHANGE;  Surgeon: Festus Aloe, MD;  Location: [facility];  Service: Urology;  Laterality: Bilateral;  . CYSTOSCOPY WITH URETHRAL DILATATION N/A 12/29/2013   Procedure: CYSTOSCOPY WITH URETHRAL DILATATION;  Surgeon: Marissa Nestle, MD;  Location[facility];  Service: Urology;  Laterality: N/A;  . ESOPHAGOGASTRODUODENOSCOPY N/A 04/27/2017   Procedure: ESOPHAGOGASTRODUODENOSCOPY (EGD);  Surgeon: Daneil Dolin, MD;  [facility];  Service: Endoscopy;  Laterality: N/A;  . ORIF FEMUR FRACTURE Right 11/22/2016   Procedure: OPEN REDUCTION INTERNAL FIXATION (ORIF) DISTAL FEMUR FRACTURE;  Surgeon: Rod Can, MD;  Location: [place];  Service: Orthopedics;  Laterality: Right;  . PERIPHERAL VASCULAR CATHETERIZATION N/A 10/08/2015   Procedure: A/V Shuntogram;  Surgeon: Angelia Mould, MD;  Location: [place] CV LAB;  Service: Cardiovascular;  Laterality: N/A;  . TRANSURETHRAL RESECTION OF PROSTATE N/A 01/04/2014   Procedure: TRANSURETHRAL RESECTION OF THE PROSTATE (TURP) (procedure #2);  Surgeon: Marissa Nestle, MD;  Location: [facility];  Service: Urology;  Laterality: N/A;    No Known Allergies  Outpatient Encounter Medications as of 10/14/2017  Medication Sig  . Amino Acids-Protein Hydrolys (FEEDING SUPPLEMENT, PRO-STAT 64,) LIQD Take 30 mLs by mouth 2 (two) times daily between meals.  Marland Kitchen atorvastatin (LIPITOR) 40 MG tablet Take 40 mg by mouth every evening.  Marland Kitchen levothyroxine (SYNTHROID, LEVOTHROID) 75 MCG tablet Take 75 mcg by mouth daily before breakfast.   . linagliptin (TRADJENTA) 5 MG TABS tablet Take 5 mg by mouth daily.  . Multiple Vitamin (MULTIVITAMIN WITH MINERALS) TABS tablet Take 1 tablet by mouth every evening.   . Neo-Bacit-Poly-Lidocaine (FIRST AID PLUS LIDOCAINE EX) Apply 1 application topically Every Tuesday,Thursday,and Saturday with dialysis. Prior to dialysis  . Nutritional Supplements (NEPRO/CARBSTEADY PO) Take 1 Can by mouth 2 (two) times  daily.  Marland Kitchen omeprazole (PRILOSEC) 40 MG capsule Take 40 mg by mouth every evening.  . sevelamer carbonate (RENVELA) 800 MG tablet Take 800 mg by mouth 2 (two) times daily.  . sevelamer carbonate (RENVELA) 800 MG tablet Take 2,400 mg by mouth 3 (three) times daily with meals.  . tamsulosin (FLOMAX) 0.4 MG CAPS capsule Take 0.4 mg by mouth daily at 8 pm. Give 30 minutes after a meal  . torsemide (DEMADEX) 10 MG tablet Take 5 tablets (50 mg total) by mouth daily.   No facility-administered encounter medications on file as of 10/14/2017.      Review of Systems   This is limited secondary to patient being a poor historian-provided by nursing as well.  In general is not complaining of fever chills he is gaining weight eating well.  Skin does not complain of rashes or itching shunt site is in his right arm.  Head ears eyes nose mouth and throat is not complaining of any visual changes or sore throat or difficulty swallowing.  Respiratory denies shortness of breath or cough.  Cardiac does not complain of chest pain has some baseline lower extremity edema.  GI does not complain of abdominal discomfort nausea vomiting diarrhea constipation.  GU no complaints of dysuria--he has minimal urine output.  Musculoskeletal is not complaining of any joint pain.  Neurologic does not complain of dizziness headache or syncope despite at times some low blood pressures.  Psych does have a history of mental delay but he is doing well with supportive care no behaviors have been noted  Immunization History  Administered Date(s) Administered  . Influenza,inj,Quad PF,6+ Mos 12/26/2013, 07/09/2015  . Influenza-Unspecified 07/31/2017  . PPD Test 03/07/2015  . Pneumococcal Conjugate-13 07/31/2017  . Pneumococcal Polysaccharide-23 03/04/2015   Pertinent  Health Maintenance Due  Topic Date Due  . OPHTHALMOLOGY EXAM  08/23/1956  . URINE MICROALBUMIN  08/23/1956  . HEMOGLOBIN A1C  03/01/2018  . FOOT EXAM   07/22/2018  . COLONOSCOPY  04/28/2027  . INFLUENZA VACCINE  Completed  . PNA vac Low Risk Adult  Completed   No flowsheet data found. Functional Status Survey:    Vitals:   10/14/17 1551  BP: (!) 124/56  Pulse: 91  Resp: (!) 22  Temp: 97.7 F (36.5 C)  TempSrc: Oral  SpO2: 97%  Weight: 186 lb (84.4 kg)  Height: 5\' 8"  (1.727 m)  Of note  manual blood pressure this evening was 96/60 Body mass index is 28.28 kg/m. Physical Exam  In general this is a pleasant male in no distress lying comfortably in bed.  His skin is warm and dry shunt site right arm has a bruit.  Eyes visual acuity appears grossly intact.  Oropharynx clear mucous membranes moist.  Chest is clear to auscultation there is no  labored breathing  Heart is regular rate and rhythm without murmur gallop or rub he has mild lower extremity edema appears to be baseline.  Abdomen is soft nontender with positive bowel sounds.  Musculoskeletal does move all extremities x4 limited exam since he is in bed but he is ambulatory largely in a wheelchair do not see any deficits from baseline.  Neurological grossly intact he has chronic lower extremity weakness which is not new cranial nerves appear to be intact his speech is clear but limited.  Psych he is oriented to self pleasant appropriate continues to answer straightforward questions with simple reasonable answers   Labs reviewed: Recent Labs    06/07/17 0800 06/08/17 0519 06/10/17 0727  06/22/17 0700 07/13/17 0700 09/01/17 0731  NA 135 134* 137   < > 133* 136 138  K 3.1* 3.5 3.2*   < > 4.0 4.1 4.2  CL 97* 97* 100*   < > 98* 100* 105  CO2 27 27 26    < > 24 26 20*  GLUCOSE 100* 96 146*   < > 189* 146* 196*  BUN 43* 50* 34*   < > 43* 43* 108*  CREATININE 5.53* 5.68* 4.96*   < > 4.72* 6.50* 8.56*  CALCIUM 7.9* 8.0* 8.4*   < > 8.6* 8.7* 8.4*  PHOS 5.3* 5.1* 3.7  --   --   --   --    < > = values in this interval not displayed.   Recent Labs     05/17/17 1558 05/18/17 1026  06/03/17 0055  06/07/17 0800 06/08/17 0519 06/10/17 0727  AST 36 29  --  16  --   --   --   --   ALT 17 15*  --  15*  --   --   --   --   ALKPHOS 102 92  --  87  --   --   --   --   BILITOT 1.0 0.9  --  0.5  --   --   --   --   PROT 5.9* 5.3*  --  6.5  --   --   --   --   ALBUMIN 2.3* 2.0*   < > 2.4*   < > 2.0* 2.0* 2.1*   < > = values in this interval not displayed.   Recent Labs    06/15/17 0630 07/13/17 0700 09/01/17 0731  WBC 8.6 7.1 5.8  NEUTROABS 5.8 4.1 3.3  HGB 9.8* 12.6* 11.7*  HCT 30.2* 39.6 35.7*  MCV 92.1 100.3* 100.3*  PLT 313 181 149*   Lab Results  Component Value Date   TSH 1.585 09/01/2017   Lab Results  Component Value Date   HGBA1C 6.8 (H) 09/01/2017   Lab Results  Component Value Date   CHOL 104 07/13/2017   HDL 34 (L) 07/13/2017   LDLCALC 28 07/13/2017   TRIG 208 (H) 07/13/2017   CHOLHDL 3.1 07/13/2017    Significant Diagnostic Results in last 30 days:  No results found.  Assessment/Plan  #1 history of end-stage renal disease on chronic dialysis appears to be stable in this regards tolerating dialysis well he continues on Demadex as well edemat appears to be stable is actually gaining weight but I suspect this is from a very good appetite  #2 history of anemia with previous history of GI bleed back in July-he is made a very nice recovery from this hemoglobin appears stabilized at 11.7 this is followed by dialysis as  well- he is on a proton pump inhibitor at this point will monitor.  3.  History of diabetes type 2 Tradjenta was recently added this appears to be effective blood sugars largely in the lower 100s hemoglobin A1c was 6.8 in early November 2018.  4.  Hypothyroidism TSH was 1.585 on lab done in early November he is on Synthroid will monitor periodically.  5.  History of DVT right leg-again Lovenox was DC'd back in July thought to have completed an adequate course for the DVT- clinically appears to be  stable.  6.  History of BPH---continues on Flomax   #7- history of hyperlipidemia LDL was 28 on lipid panel in September will monitor periodically.--He continues on a statin   8.  History of right distal femur fracture status post ORIF again he is largely ambulatory in a wheelchair he does well with supportive care pain appears to be controlled.   #9-  History of low blood pressures-this is not new appears to be tolerated well he ihasbeen taken off his Coreg secondary to this nonetheless heart rate appears to be relatively well controlled in the 90s on exam tonight will monitor   [phone]

## 2017-10-15 DIAGNOSIS — Z992 Dependence on renal dialysis: Secondary | ICD-10-CM | POA: Diagnosis not present

## 2017-10-15 DIAGNOSIS — D509 Iron deficiency anemia, unspecified: Secondary | ICD-10-CM | POA: Diagnosis not present

## 2017-10-15 DIAGNOSIS — N186 End stage renal disease: Secondary | ICD-10-CM | POA: Diagnosis not present

## 2017-10-15 DIAGNOSIS — F70 Mild intellectual disabilities: Secondary | ICD-10-CM | POA: Diagnosis not present

## 2017-10-15 DIAGNOSIS — F4322 Adjustment disorder with anxiety: Secondary | ICD-10-CM | POA: Diagnosis not present

## 2017-10-17 DIAGNOSIS — D509 Iron deficiency anemia, unspecified: Secondary | ICD-10-CM | POA: Diagnosis not present

## 2017-10-17 DIAGNOSIS — Z992 Dependence on renal dialysis: Secondary | ICD-10-CM | POA: Diagnosis not present

## 2017-10-17 DIAGNOSIS — N186 End stage renal disease: Secondary | ICD-10-CM | POA: Diagnosis not present

## 2017-10-19 DIAGNOSIS — N186 End stage renal disease: Secondary | ICD-10-CM | POA: Diagnosis not present

## 2017-10-19 DIAGNOSIS — Z992 Dependence on renal dialysis: Secondary | ICD-10-CM | POA: Diagnosis not present

## 2017-10-19 DIAGNOSIS — D509 Iron deficiency anemia, unspecified: Secondary | ICD-10-CM | POA: Diagnosis not present

## 2017-10-22 DIAGNOSIS — Z992 Dependence on renal dialysis: Secondary | ICD-10-CM | POA: Diagnosis not present

## 2017-10-22 DIAGNOSIS — N186 End stage renal disease: Secondary | ICD-10-CM | POA: Diagnosis not present

## 2017-10-22 DIAGNOSIS — D509 Iron deficiency anemia, unspecified: Secondary | ICD-10-CM | POA: Diagnosis not present

## 2017-10-24 DIAGNOSIS — N186 End stage renal disease: Secondary | ICD-10-CM | POA: Diagnosis not present

## 2017-10-24 DIAGNOSIS — Z992 Dependence on renal dialysis: Secondary | ICD-10-CM | POA: Diagnosis not present

## 2017-10-24 DIAGNOSIS — D509 Iron deficiency anemia, unspecified: Secondary | ICD-10-CM | POA: Diagnosis not present

## 2017-10-26 DIAGNOSIS — N186 End stage renal disease: Secondary | ICD-10-CM | POA: Diagnosis not present

## 2017-10-26 DIAGNOSIS — D509 Iron deficiency anemia, unspecified: Secondary | ICD-10-CM | POA: Diagnosis not present

## 2017-10-26 DIAGNOSIS — Z992 Dependence on renal dialysis: Secondary | ICD-10-CM | POA: Diagnosis not present

## 2017-10-27 DIAGNOSIS — F0631 Mood disorder due to known physiological condition with depressive features: Secondary | ICD-10-CM | POA: Diagnosis not present

## 2017-10-29 ENCOUNTER — Encounter: Payer: Self-pay | Admitting: *Deleted

## 2017-10-29 ENCOUNTER — Other Ambulatory Visit: Payer: Self-pay | Admitting: *Deleted

## 2017-10-29 DIAGNOSIS — E1151 Type 2 diabetes mellitus with diabetic peripheral angiopathy without gangrene: Secondary | ICD-10-CM | POA: Diagnosis not present

## 2017-10-29 DIAGNOSIS — B351 Tinea unguium: Secondary | ICD-10-CM | POA: Diagnosis not present

## 2017-10-29 DIAGNOSIS — N186 End stage renal disease: Secondary | ICD-10-CM | POA: Diagnosis not present

## 2017-10-29 DIAGNOSIS — N2581 Secondary hyperparathyroidism of renal origin: Secondary | ICD-10-CM | POA: Diagnosis not present

## 2017-10-29 DIAGNOSIS — R262 Difficulty in walking, not elsewhere classified: Secondary | ICD-10-CM | POA: Diagnosis not present

## 2017-10-29 DIAGNOSIS — D631 Anemia in chronic kidney disease: Secondary | ICD-10-CM | POA: Diagnosis not present

## 2017-10-29 DIAGNOSIS — D509 Iron deficiency anemia, unspecified: Secondary | ICD-10-CM | POA: Diagnosis not present

## 2017-10-29 DIAGNOSIS — Z992 Dependence on renal dialysis: Secondary | ICD-10-CM | POA: Diagnosis not present

## 2017-10-29 NOTE — Progress Notes (Signed)
Request from [facility] at [facility] for Fistulogram due to complications with fistula "low flow". Referring physician Dr. Berton Bon. HD days are T,TH,SA.

## 2017-10-31 DIAGNOSIS — D631 Anemia in chronic kidney disease: Secondary | ICD-10-CM | POA: Diagnosis not present

## 2017-10-31 DIAGNOSIS — N186 End stage renal disease: Secondary | ICD-10-CM | POA: Diagnosis not present

## 2017-10-31 DIAGNOSIS — Z992 Dependence on renal dialysis: Secondary | ICD-10-CM | POA: Diagnosis not present

## 2017-10-31 DIAGNOSIS — N2581 Secondary hyperparathyroidism of renal origin: Secondary | ICD-10-CM | POA: Diagnosis not present

## 2017-10-31 DIAGNOSIS — D509 Iron deficiency anemia, unspecified: Secondary | ICD-10-CM | POA: Diagnosis not present

## 2017-11-03 DIAGNOSIS — N2581 Secondary hyperparathyroidism of renal origin: Secondary | ICD-10-CM | POA: Diagnosis not present

## 2017-11-03 DIAGNOSIS — D631 Anemia in chronic kidney disease: Secondary | ICD-10-CM | POA: Diagnosis not present

## 2017-11-03 DIAGNOSIS — N186 End stage renal disease: Secondary | ICD-10-CM | POA: Diagnosis not present

## 2017-11-03 DIAGNOSIS — D509 Iron deficiency anemia, unspecified: Secondary | ICD-10-CM | POA: Diagnosis not present

## 2017-11-03 DIAGNOSIS — Z992 Dependence on renal dialysis: Secondary | ICD-10-CM | POA: Diagnosis not present

## 2017-11-05 DIAGNOSIS — N2581 Secondary hyperparathyroidism of renal origin: Secondary | ICD-10-CM | POA: Diagnosis not present

## 2017-11-05 DIAGNOSIS — Z992 Dependence on renal dialysis: Secondary | ICD-10-CM | POA: Diagnosis not present

## 2017-11-05 DIAGNOSIS — D631 Anemia in chronic kidney disease: Secondary | ICD-10-CM | POA: Diagnosis not present

## 2017-11-05 DIAGNOSIS — D509 Iron deficiency anemia, unspecified: Secondary | ICD-10-CM | POA: Diagnosis not present

## 2017-11-05 DIAGNOSIS — N186 End stage renal disease: Secondary | ICD-10-CM | POA: Diagnosis not present

## 2017-11-06 ENCOUNTER — Inpatient Hospital Stay
Admission: RE | Admit: 2017-11-06 | Discharge: 2017-12-10 | Disposition: A | Discharge status: Other Acute Inpatient Hospital | Payer: Medicare Other | Source: Ambulatory Visit | Attending: Internal Medicine | Admitting: Internal Medicine

## 2017-11-06 ENCOUNTER — Ambulatory Visit (HOSPITAL_COMMUNITY)
Admission: RE | Admit: 2017-11-06 | Discharge: 2017-11-06 | Disposition: A | Discharge status: Home / Self Care | Payer: Medicare Other | Source: Ambulatory Visit | Attending: Vascular Surgery | Admitting: Vascular Surgery

## 2017-11-06 ENCOUNTER — Encounter (HOSPITAL_COMMUNITY)
Admission: RE | Disposition: A | Discharge status: Home / Self Care | Payer: Self-pay | Source: Ambulatory Visit | Attending: Vascular Surgery

## 2017-11-06 DIAGNOSIS — N186 End stage renal disease: Secondary | ICD-10-CM | POA: Insufficient documentation

## 2017-11-06 DIAGNOSIS — Z539 Procedure and treatment not carried out, unspecified reason: Secondary | ICD-10-CM | POA: Insufficient documentation

## 2017-11-06 LAB — GLUCOSE, CAPILLARY: Glucose-Capillary: 99 mg/dL (ref 65–99)

## 2017-11-06 SURGERY — A/V FISTULAGRAM
Anesthesia: LOCAL

## 2017-11-06 MED ORDER — SODIUM CHLORIDE 0.9% FLUSH: 3.0000 mL | INTRAVENOUS | Status: DC | PRN

## 2017-11-06 NOTE — Progress Notes (Signed)
Pt unable to confirm name and date of birth.  Pt. Is a ward of the state.  Called [employer], guardian, unaware pt is having a procedure today.  Can not come to hospital to confirm pts identity.  [facility] [place] notified that case will be cancelled and rescheduled for another day. Pt sent via transportation back to [facility] and center called and notified.

## 2017-11-07 DIAGNOSIS — D631 Anemia in chronic kidney disease: Secondary | ICD-10-CM | POA: Diagnosis not present

## 2017-11-07 DIAGNOSIS — Z992 Dependence on renal dialysis: Secondary | ICD-10-CM | POA: Diagnosis not present

## 2017-11-07 DIAGNOSIS — N2581 Secondary hyperparathyroidism of renal origin: Secondary | ICD-10-CM | POA: Diagnosis not present

## 2017-11-07 DIAGNOSIS — D509 Iron deficiency anemia, unspecified: Secondary | ICD-10-CM | POA: Diagnosis not present

## 2017-11-07 DIAGNOSIS — N186 End stage renal disease: Secondary | ICD-10-CM | POA: Diagnosis not present

## 2017-11-10 DIAGNOSIS — N186 End stage renal disease: Secondary | ICD-10-CM | POA: Diagnosis not present

## 2017-11-10 DIAGNOSIS — E119 Type 2 diabetes mellitus without complications: Secondary | ICD-10-CM | POA: Diagnosis not present

## 2017-11-10 DIAGNOSIS — Z992 Dependence on renal dialysis: Secondary | ICD-10-CM | POA: Diagnosis not present

## 2017-11-10 DIAGNOSIS — D509 Iron deficiency anemia, unspecified: Secondary | ICD-10-CM | POA: Diagnosis not present

## 2017-11-10 DIAGNOSIS — D631 Anemia in chronic kidney disease: Secondary | ICD-10-CM | POA: Diagnosis not present

## 2017-11-10 DIAGNOSIS — N2581 Secondary hyperparathyroidism of renal origin: Secondary | ICD-10-CM | POA: Diagnosis not present

## 2017-11-12 DIAGNOSIS — Z992 Dependence on renal dialysis: Secondary | ICD-10-CM | POA: Diagnosis not present

## 2017-11-12 DIAGNOSIS — N186 End stage renal disease: Secondary | ICD-10-CM | POA: Diagnosis not present

## 2017-11-12 DIAGNOSIS — D631 Anemia in chronic kidney disease: Secondary | ICD-10-CM | POA: Diagnosis not present

## 2017-11-12 DIAGNOSIS — D509 Iron deficiency anemia, unspecified: Secondary | ICD-10-CM | POA: Diagnosis not present

## 2017-11-12 DIAGNOSIS — N2581 Secondary hyperparathyroidism of renal origin: Secondary | ICD-10-CM | POA: Diagnosis not present

## 2017-11-13 ENCOUNTER — Non-Acute Institutional Stay (SKILLED_NURSING_FACILITY): Payer: Medicare Other | Admitting: Internal Medicine

## 2017-11-13 ENCOUNTER — Encounter: Payer: Self-pay | Admitting: Internal Medicine

## 2017-11-13 DIAGNOSIS — N186 End stage renal disease: Secondary | ICD-10-CM

## 2017-11-13 DIAGNOSIS — E1121 Type 2 diabetes mellitus with diabetic nephropathy: Secondary | ICD-10-CM

## 2017-11-13 DIAGNOSIS — D638 Anemia in other chronic diseases classified elsewhere: Secondary | ICD-10-CM | POA: Diagnosis not present

## 2017-11-13 DIAGNOSIS — K922 Gastrointestinal hemorrhage, unspecified: Secondary | ICD-10-CM

## 2017-11-13 DIAGNOSIS — I82401 Acute embolism and thrombosis of unspecified deep veins of right lower extremity: Secondary | ICD-10-CM | POA: Diagnosis not present

## 2017-11-13 DIAGNOSIS — E7849 Other hyperlipidemia: Secondary | ICD-10-CM | POA: Diagnosis not present

## 2017-11-13 NOTE — Progress Notes (Signed)
Location:   [place] Room Number: 112/D Place of Service:  SNF (31) Provider:  Sharlene Dory, MD  Patient Care Team: [name], MD as PCP - General (Internal Medicine) Cassandria Anger, MD as Consulting Physician (Endocrinology) Fran Lowes, MD as Consulting Physician (Nephrology) Gala Romney Cristopher Estimable, MD as Consulting Physician (Gastroenterology)  Extended Emergency Contact Information Primary Emergency Contact: Betsey Amen Address: DSS  Johnnette Litter of [place] Phone: [phone] Work Phone: [phone] Relation: Legal Guardian Secondary Emergency Contact: Liming,Richard Address: [address]          [place], [place] [address] [place] Phone: [phone] Relation: Brother  Code Status:  Full Code Goals of care: Advanced Directive information Advanced Directives 11/13/2017  Does Patient Have a Medical Advance Directive? Yes  Type of Advance Directive (No Data)  Does patient want to make changes to medical advance directive? No - Patient declined  Copy of [place] in Chart? No - copy requested  Would patient like information on creating a medical advance directive? No - Patient declined  Pre-existing out of facility DNR order (yellow form or pink MOST form) -   Chief complaint routine visit for medical management of chronic medical conditions including end-stage renal disease on dialysis--type 2 diabetes-anemia with history of GI bleed-hypothyroidism- BPH- history of right femoral fracture- history of right lower extremity DVT-hyperlipidemia  +  HPI:  Pt is a 72 y.o. male seen today for medical management of chronic diseases.  As noted above.  He continues to have a period of stability has gained some weight but this appears to be more appetite related he does snack quite a bit even though nursing staff is trying to get this moderated  He recently came here for rehab  after a right femoral fracture that was surgically repaired.  This was complicated by a right lower extremity DVT-further complicated with a GI bleed this summer.  His Lovenox was stopped secondary to believe that he had finished duration of treatment for the DVT.  At one point he did require a colectomy but is made a good recovery from this and again his appetite is very good.  Hemoglobin at one point was in the sevens but this has rebounded most recently was 11.7-he is monitored by dialysis as well. He continues on a PPI   He does have end-stage renal disease on chronic hemodialysis and appears this is stable he is on Demadex.  He also has a history of type 2 diabetes that appears well controlled he is on Tradjenta hemoglobin A1c was 6.8 back in November blood sugars appear to be largely in the lower to mid 100s with occasional spikes a bit higher but not consistent with.  He also has a history of hypothyroidism on Synthroid TSH was within normal range at 1.585 in November 2018.  Regards to BPH he is on Flomax this apparently is not an issue recently.  Also has a history of hyperlipidemia LDL was 28 on the lab done in September he is on a statin  Currently has no complaints he does have a history of somewhat low blood pressures with systolics at times below 025 but he is asymptomatic I got a manual reading of 110/78 today.  Currently has no complaints he is somewhat a poor historian but nursing staff reports he is doing quite well and again does snack quite a bit which has contributed I suspect to gain of about 5 pounds over  the past month       Past Medical History:  Diagnosis Date  . Abnormal CT scan, kidney 10/06/2011  . Acute pyelonephritis 10/07/2011  . Anemia    normocytic  . Anxiety    mental retardation  . Bladder wall thickening 10/06/2011  . BPH (benign prostatic hypertrophy)   . Diabetes mellitus   . Dialysis patient [facility])    Tuesday, Thursday and Saturday,     . Edema     history of lower extremity edema  . GERD (gastroesophageal reflux disease)   . Heme positive stool   . Hydronephrosis   . Hyperkalemia   . Hyperlipidemia   . Hypernatremia   . Hypertension   . Hypothyroidism   . Impaired speech   . 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 MR (mental retardation)   . Muscle weakness   . Obstructive uropathy   . 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 Poor historian poor historian  . Protein calorie malnutrition ([place])   . Pyelonephritis   . Renal failure (ARF), acute on chronic (HCC)   . Renal insufficiency    chronic history  . Sepsis ([place])   . Smoking   . Uremia   . Urinary retention   . UTI (lower urinary tract infection) 10/06/2011   Past Surgical History:  Procedure Laterality Date  . AV FISTULA PLACEMENT Left 07/06/2015   Procedure:  INSERTION LEFT ARM ARTERIOVENOUS GORTEX GRAFT;  Surgeon: Angelia Mould, MD;  Location: [place];  Service: Vascular;  Laterality: Left;  . AV FISTULA PLACEMENT Right 02/26/2016   Procedure: ARTERIOVENOUS (AV) FISTULA CREATION ;  Surgeon: Angelia Mould, MD;  Location: [place];  Service: Vascular;  Laterality: Right;  . [place] REMOVAL Left 10/09/2015   Procedure: REMOVAL OF ARTERIOVENOUS GORETEX GRAFT ([place]) Evacuation of Lymphocele, Vein Patch angioplasty of brachial artery.;  Surgeon: Angelia Mould, MD;  Location: [place];  Service: Vascular;  Laterality: Left;  . BASCILIC VEIN TRANSPOSITION Right 02/26/2016   Procedure: Right BASCILIC VEIN TRANSPOSITION;  Surgeon: Angelia Mould, MD;  Location: [place];  Service: Vascular;  Laterality: Right;  . CIRCUMCISION N/A 01/04/2014   Procedure: CIRCUMCISION ADULT (procedure #1);  Surgeon: Marissa Nestle, MD;  Location: [facility];  Service: Urology;  Laterality: N/A;  . COLECTOMY N/A 05/04/2017   Procedure: TOTAL COLECTOMY;  Surgeon: Aviva Signs, MD;  Location: [facility];  Service: General;  Laterality: N/A;  . COLONOSCOPY N/A 04/27/2017    Procedure: COLONOSCOPY;  Surgeon: Daneil Dolin, MD;  [facility];  Service: Endoscopy;  Laterality: N/A;  245  . CYSTOSCOPY W/ RETROGRADES Bilateral 06/29/2015   Procedure: CYSTOSCOPY, DILATION OF URETHRAL STRICTURE WITH BILATERAL RETROGRADE PYELOGRAM,SUPRAPUBIC TUBE CHANGE;  Surgeon: Festus Aloe, MD;  Location: [facility];  Service: Urology;  Laterality: Bilateral;  . CYSTOSCOPY WITH URETHRAL DILATATION N/A 12/29/2013   Procedure: CYSTOSCOPY WITH URETHRAL DILATATION;  Surgeon: Marissa Nestle, MD;  Location[facility];  Service: Urology;  Laterality: N/A;  . ESOPHAGOGASTRODUODENOSCOPY N/A 04/27/2017   Procedure: ESOPHAGOGASTRODUODENOSCOPY (EGD);  Surgeon: Daneil Dolin, MD;  [facility];  Service: Endoscopy;  Laterality: N/A;  . ORIF FEMUR FRACTURE Right 11/22/2016   Procedure: OPEN REDUCTION INTERNAL FIXATION (ORIF) DISTAL FEMUR FRACTURE;  Surgeon: Rod Can, MD;  Location: [place];  Service: Orthopedics;  Laterality: Right;  . PERIPHERAL VASCULAR CATHETERIZATION N/A 10/08/2015   Procedure: A/V Shuntogram;  Surgeon: Angelia Mould, MD;  Location: [place] CV LAB;  Service: Cardiovascular;  Laterality: N/A;  . TRANSURETHRAL RESECTION OF PROSTATE N/A 01/04/2014   Procedure: TRANSURETHRAL RESECTION OF THE PROSTATE (TURP) (procedure #2);  Surgeon: Marissa Nestle, MD;  Location: [facility];  Service: Urology;  Laterality: N/A;    No Known Allergies  Outpatient Encounter Medications as of 11/13/2017  Medication Sig  . Amino Acids-Protein Hydrolys (FEEDING SUPPLEMENT, PRO-STAT 64,) LIQD Take 30 mLs by mouth 2 (two) times daily between meals.  Marland Kitchen atorvastatin (LIPITOR) 40 MG tablet Take 40 mg by mouth every evening.  Marland Kitchen levothyroxine (SYNTHROID, LEVOTHROID) 75 MCG tablet Take 75 mcg by mouth daily before breakfast.   . linagliptin (TRADJENTA) 5 MG TABS tablet Take 5 mg by mouth every evening.   . midodrine (PROAMATINE) 10 MG tablet Take 10 mg by mouth. Send medication  with patient on Tue.Thu., Sat.,to dialysis do not give at [facility] give to take with him  . Multiple Vitamin (MULTIVITAMIN WITH MINERALS) TABS tablet Take 1 tablet by mouth every evening.   . Neo-Bacit-Poly-Lidocaine (FIRST AID PLUS LIDOCAINE EX) Apply 1 application topically Every Tuesday,Thursday,and Saturday with dialysis. Prior to dialysis  . Nutritional Supplements (NEPRO/CARBSTEADY PO) Take 1 Can by mouth 2 (two) times daily.  Marland Kitchen omeprazole (PRILOSEC) 40 MG capsule Take 40 mg by mouth at bedtime.   . sevelamer carbonate (RENVELA) 800 MG tablet Take 800 mg by mouth 2 (two) times daily.   . sevelamer carbonate (RENVELA) 800 MG tablet Take 2,400 mg by mouth 3 (three) times daily with meals.  . tamsulosin (FLOMAX) 0.4 MG CAPS capsule Take 0.4 mg by mouth every evening. Give 30 minutes after a meal  . torsemide (DEMADEX) 10 MG tablet Take 5 tablets (50 mg total) by mouth daily.   No facility-administered encounter medications on file as of 11/13/2017.      Review of Systems    This is limited secondary to patient being a poor historian provided by nursing staff as well.  General no complaints of fever chills he has gained weight as noted above.  Skin does not complain of rashes or itching.  Head ears eyes nose mouth and throat does not complain of visual changes sore throat or difficulty swallowing.  Respiratory denies shortness of breath does not have a cough.  Cardiac no complaints of chest pain has some quite mild lower extremity edema.  GI is not complaining of any abdominal discomfort nausea vomiting diarrhea or constipation has a very good appetite.  GU has an history of end-stage renal disease on dialysis does not complain of dysuria he is on Flomax for history of BPH.  Musculoskeletal does not complain of any joint pain.  Neurologic is not complaining of dizziness headache or numbness or syncope.  Psych has a history of mental delay but does well with supportive care no  behaviors noted by nursing staff appears to be doing well in this environment  Immunization History  Administered Date(s) Administered  . Influenza,inj,Quad PF,6+ Mos 12/26/2013, 07/09/2015  . Influenza-Unspecified 07/31/2017  . PPD Test 03/07/2015  . Pneumococcal Conjugate-13 07/31/2017  . Pneumococcal Polysaccharide-23 03/04/2015  . Tdap 08/22/2017   Pertinent  Health Maintenance Due  Topic Date Due  . OPHTHALMOLOGY EXAM  12/14/2017 (Originally 08/23/1956)  . URINE MICROALBUMIN  12/14/2017 (Originally 08/23/1956)  . HEMOGLOBIN A1C  03/01/2018  . FOOT EXAM  07/22/2018  . COLONOSCOPY  04/28/2027  . INFLUENZA VACCINE  Completed  . PNA vac Low Risk Adult  Completed   No flowsheet data found. Functional Status Survey:  Vitals:   11/13/17 1021  BP: 100/64  Pulse: 90  Resp: 20  Temp: 98.9 F (37.2 C)  TempSrc: Oral  SpO2: 98%  Weight: 199 lb 12.8 oz (90.6 kg)  Height: 5\' 8"  (1.727 m)   Body mass index is 30.38 kg/m. Physical Exam   In general this is a pleasant middle-age male in no distress sitting comfortably in his wheelchair.  His skin is warm and dry.  Eyes visual acuity appears grossly intact.  Oropharynx is clear mucous membranes moist.  Chest is clear to auscultation with somewhat shallow air entry there is no labored breathing.  Heart is regular rate and rhythm without murmur gallop or rub he has mild lower extremity edema.  Abdomen is obese soft nontender with positive bowel sounds.  Musculoskeletal is able to move all extremities x4 at baseline ambulates largely in a wheelchair strength appears to be intact upper extremities has some lower extremity weakness.  Neurologic is grossly intact and at baseline no lateralizing findings.  Psych he is oriented to self continues to be pleasant appropriate is able to answer fairly simple questions without difficulty  Labs reviewed: Recent Labs    06/07/17 0800 06/08/17 0519 06/10/17 0727  06/22/17 0700  07/13/17 0700 09/01/17 0731  NA 135 134* 137   < > 133* 136 138  K 3.1* 3.5 3.2*   < > 4.0 4.1 4.2  CL 97* 97* 100*   < > 98* 100* 105  CO2 27 27 26    < > 24 26 20*  GLUCOSE 100* 96 146*   < > 189* 146* 196*  BUN 43* 50* 34*   < > 43* 43* 108*  CREATININE 5.53* 5.68* 4.96*   < > 4.72* 6.50* 8.56*  CALCIUM 7.9* 8.0* 8.4*   < > 8.6* 8.7* 8.4*  PHOS 5.3* 5.1* 3.7  --   --   --   --    < > = values in this interval not displayed.   Recent Labs    05/17/17 1558 05/18/17 1026  06/03/17 0055  06/07/17 0800 06/08/17 0519 06/10/17 0727  AST 36 29  --  16  --   --   --   --   ALT 17 15*  --  15*  --   --   --   --   ALKPHOS 102 92  --  87  --   --   --   --   BILITOT 1.0 0.9  --  0.5  --   --   --   --   PROT 5.9* 5.3*  --  6.5  --   --   --   --   ALBUMIN 2.3* 2.0*   < > 2.4*   < > 2.0* 2.0* 2.1*   < > = values in this interval not displayed.   Recent Labs    06/15/17 0630 07/13/17 0700 09/01/17 0731  WBC 8.6 7.1 5.8  NEUTROABS 5.8 4.1 3.3  HGB 9.8* 12.6* 11.7*  HCT 30.2* 39.6 35.7*  MCV 92.1 100.3* 100.3*  PLT 313 181 149*   Lab Results  Component Value Date   TSH 1.585 09/01/2017   Lab Results  Component Value Date   HGBA1C 6.8 (H) 09/01/2017   Lab Results  Component Value Date   CHOL 104 07/13/2017   HDL 34 (L) 07/13/2017   LDLCALC 28 07/13/2017   TRIG 208 (H) 07/13/2017   CHOLHDL 3.1 07/13/2017    Significant Diagnostic Results in last  30 days:  No results found.  Assessment/Plan   #1-history of weight gain-I suspect this is appetite related edema appears to be at baseline he is followed by dialysis he does continue on Demadex hehas been encouraged him to cut down snacks although I suspect this may be a challenge--clinically he appears to be doing well however.  2.  History of end-stage renal disease continues on dialysis as well as Demadex this is followed by dialysis he appears to be stable.  3.  History of type 2 diabetes as noted above blood sugars  appear to be largely in the lower to mid 100s hemoglobin A1c was 6.8 in November 2018.  He continues on Tradjenta--is  4.  Anemia with history of GI bleed as well as chronic disease-- this has now stabilized it appears hemoglobin was 11.7 on lab in November will update this labs may be drawn in dialysis if possible He does continue on PPI.  5.  History of hypothyroidism TSH is in normal limits.  November labs he is on Synthroid.  6.  History of right lower extremity DVT-again he ihas this completed a course of Lovenox this is complicated with GI bleed nonetheless this appears to be stable at this point.  7.  History of right femoral fracture again this was treated surgically he appears to be stable in this regards pain appears to be controlled he largely ambulates in a wheelchair.  8.  History of hyperlipidemia as noted above LDL was 28 he is on a statin.  9.  History of BPH continues on Flomax this appears to be stable and relatively asymptomatic as well.  10.  History of asymptomatic hypotension at times systolics will be somewhat under 100 but he is asymptomatic will monitor for now he is not on any hypertensive agent.   [id number] note greater than 35 minutes spent assessing patient- reviewing his chart reviewing his labs discussing his status with nursing staff- and coordinating and formulating a plan of care for numerous diagnoses-of note greater than 50% of time spent coordinating plan of care

## 2017-11-14 ENCOUNTER — Encounter (HOSPITAL_COMMUNITY)
Admission: AD | Admit: 2017-11-14 | Discharge: 2017-11-14 | Disposition: A | Discharge status: Home / Self Care | Payer: Medicare Other | Source: Skilled Nursing Facility | Attending: Internal Medicine | Admitting: Internal Medicine

## 2017-11-14 DIAGNOSIS — D500 Iron deficiency anemia secondary to blood loss (chronic): Secondary | ICD-10-CM | POA: Diagnosis not present

## 2017-11-14 DIAGNOSIS — Z7901 Long term (current) use of anticoagulants: Secondary | ICD-10-CM | POA: Diagnosis not present

## 2017-11-14 DIAGNOSIS — D509 Iron deficiency anemia, unspecified: Secondary | ICD-10-CM | POA: Diagnosis not present

## 2017-11-14 DIAGNOSIS — Z9049 Acquired absence of other specified parts of digestive tract: Secondary | ICD-10-CM | POA: Insufficient documentation

## 2017-11-14 DIAGNOSIS — N186 End stage renal disease: Secondary | ICD-10-CM | POA: Diagnosis not present

## 2017-11-14 DIAGNOSIS — Z992 Dependence on renal dialysis: Secondary | ICD-10-CM | POA: Diagnosis not present

## 2017-11-14 DIAGNOSIS — D631 Anemia in chronic kidney disease: Secondary | ICD-10-CM | POA: Diagnosis not present

## 2017-11-14 DIAGNOSIS — E119 Type 2 diabetes mellitus without complications: Secondary | ICD-10-CM | POA: Insufficient documentation

## 2017-11-14 DIAGNOSIS — E43 Unspecified severe protein-calorie malnutrition: Secondary | ICD-10-CM | POA: Insufficient documentation

## 2017-11-14 DIAGNOSIS — N2581 Secondary hyperparathyroidism of renal origin: Secondary | ICD-10-CM | POA: Diagnosis not present

## 2017-11-14 LAB — BASIC METABOLIC PANEL
Anion gap: 11 (ref 5–15)
BUN: 55 mg/dL — ABNORMAL HIGH (ref 6–20)
CO2: 22 mmol/L (ref 22–32)
Calcium: 8.7 mg/dL — ABNORMAL LOW (ref 8.9–10.3)
Chloride: 113 mmol/L — ABNORMAL HIGH (ref 101–111)
Creatinine, Ser: 8.69 mg/dL — ABNORMAL HIGH (ref 0.61–1.24)
GFR calc Af Amer: 6 mL/min — ABNORMAL LOW (ref >60)
GFR calc non Af Amer: 5 mL/min — ABNORMAL LOW (ref >60)
Glucose, Bld: 142 mg/dL — ABNORMAL HIGH (ref 65–99)
Potassium: 5.9 mmol/L — ABNORMAL HIGH (ref 3.5–5.1)
Sodium: 146 mmol/L — ABNORMAL HIGH (ref 135–145)

## 2017-11-14 LAB — CBC WITH DIFFERENTIAL/PLATELET
Basophils Absolute: 0 10*3/uL (ref 0.0–0.1)
Basophils Relative: 0 %
Eosinophils Absolute: 0.2 10*3/uL (ref 0.0–0.7)
Eosinophils Relative: 3 %
HCT: 31.3 % — ABNORMAL LOW (ref 39.0–52.0)
Hemoglobin: 10.3 g/dL — ABNORMAL LOW (ref 13.0–17.0)
Lymphocytes Relative: 34 %
Lymphs Abs: 1.8 10*3/uL (ref 0.7–4.0)
MCH: 35.9 pg — ABNORMAL HIGH (ref 26.0–34.0)
MCHC: 32.9 g/dL (ref 30.0–36.0)
MCV: 109.1 fL — ABNORMAL HIGH (ref 78.0–100.0)
Monocytes Absolute: 0.7 10*3/uL (ref 0.1–1.0)
Monocytes Relative: 13 %
Neutro Abs: 2.6 10*3/uL (ref 1.7–7.7)
Neutrophils Relative %: 50 %
Platelets: 130 10*3/uL — ABNORMAL LOW (ref 150–400)
RBC: 2.87 MIL/uL — ABNORMAL LOW (ref 4.22–5.81)
RDW: 17 % — ABNORMAL HIGH (ref 11.5–15.5)
WBC: 5.2 10*3/uL (ref 4.0–10.5)

## 2017-11-14 LAB — TSH: TSH: 2.197 u[IU]/mL (ref 0.350–4.500)

## 2017-11-15 ENCOUNTER — Encounter: Payer: Self-pay | Admitting: Internal Medicine

## 2017-11-17 DIAGNOSIS — D631 Anemia in chronic kidney disease: Secondary | ICD-10-CM | POA: Diagnosis not present

## 2017-11-17 DIAGNOSIS — Z992 Dependence on renal dialysis: Secondary | ICD-10-CM | POA: Diagnosis not present

## 2017-11-17 DIAGNOSIS — D509 Iron deficiency anemia, unspecified: Secondary | ICD-10-CM | POA: Diagnosis not present

## 2017-11-17 DIAGNOSIS — N2581 Secondary hyperparathyroidism of renal origin: Secondary | ICD-10-CM | POA: Diagnosis not present

## 2017-11-17 DIAGNOSIS — N186 End stage renal disease: Secondary | ICD-10-CM | POA: Diagnosis not present

## 2017-11-19 DIAGNOSIS — D631 Anemia in chronic kidney disease: Secondary | ICD-10-CM | POA: Diagnosis not present

## 2017-11-19 DIAGNOSIS — N2581 Secondary hyperparathyroidism of renal origin: Secondary | ICD-10-CM | POA: Diagnosis not present

## 2017-11-19 DIAGNOSIS — D509 Iron deficiency anemia, unspecified: Secondary | ICD-10-CM | POA: Diagnosis not present

## 2017-11-19 DIAGNOSIS — Z992 Dependence on renal dialysis: Secondary | ICD-10-CM | POA: Diagnosis not present

## 2017-11-19 DIAGNOSIS — N186 End stage renal disease: Secondary | ICD-10-CM | POA: Diagnosis not present

## 2017-11-21 DIAGNOSIS — Z992 Dependence on renal dialysis: Secondary | ICD-10-CM | POA: Diagnosis not present

## 2017-11-21 DIAGNOSIS — D631 Anemia in chronic kidney disease: Secondary | ICD-10-CM | POA: Diagnosis not present

## 2017-11-21 DIAGNOSIS — N2581 Secondary hyperparathyroidism of renal origin: Secondary | ICD-10-CM | POA: Diagnosis not present

## 2017-11-21 DIAGNOSIS — D509 Iron deficiency anemia, unspecified: Secondary | ICD-10-CM | POA: Diagnosis not present

## 2017-11-21 DIAGNOSIS — N186 End stage renal disease: Secondary | ICD-10-CM | POA: Diagnosis not present

## 2017-11-24 DIAGNOSIS — Z992 Dependence on renal dialysis: Secondary | ICD-10-CM | POA: Diagnosis not present

## 2017-11-24 DIAGNOSIS — D631 Anemia in chronic kidney disease: Secondary | ICD-10-CM | POA: Diagnosis not present

## 2017-11-24 DIAGNOSIS — N2581 Secondary hyperparathyroidism of renal origin: Secondary | ICD-10-CM | POA: Diagnosis not present

## 2017-11-24 DIAGNOSIS — F0631 Mood disorder due to known physiological condition with depressive features: Secondary | ICD-10-CM | POA: Diagnosis not present

## 2017-11-24 DIAGNOSIS — D509 Iron deficiency anemia, unspecified: Secondary | ICD-10-CM | POA: Diagnosis not present

## 2017-11-24 DIAGNOSIS — N186 End stage renal disease: Secondary | ICD-10-CM | POA: Diagnosis not present

## 2017-11-26 DIAGNOSIS — Z992 Dependence on renal dialysis: Secondary | ICD-10-CM | POA: Diagnosis not present

## 2017-11-26 DIAGNOSIS — D509 Iron deficiency anemia, unspecified: Secondary | ICD-10-CM | POA: Diagnosis not present

## 2017-11-26 DIAGNOSIS — N186 End stage renal disease: Secondary | ICD-10-CM | POA: Diagnosis not present

## 2017-11-26 DIAGNOSIS — D631 Anemia in chronic kidney disease: Secondary | ICD-10-CM | POA: Diagnosis not present

## 2017-11-26 DIAGNOSIS — N2581 Secondary hyperparathyroidism of renal origin: Secondary | ICD-10-CM | POA: Diagnosis not present

## 2017-11-28 DIAGNOSIS — Z992 Dependence on renal dialysis: Secondary | ICD-10-CM | POA: Diagnosis not present

## 2017-11-28 DIAGNOSIS — N2581 Secondary hyperparathyroidism of renal origin: Secondary | ICD-10-CM | POA: Diagnosis not present

## 2017-11-28 DIAGNOSIS — D509 Iron deficiency anemia, unspecified: Secondary | ICD-10-CM | POA: Diagnosis not present

## 2017-11-28 DIAGNOSIS — N186 End stage renal disease: Secondary | ICD-10-CM | POA: Diagnosis not present

## 2017-11-28 DIAGNOSIS — D631 Anemia in chronic kidney disease: Secondary | ICD-10-CM | POA: Diagnosis not present

## 2017-12-01 DIAGNOSIS — Z992 Dependence on renal dialysis: Secondary | ICD-10-CM | POA: Diagnosis not present

## 2017-12-01 DIAGNOSIS — D509 Iron deficiency anemia, unspecified: Secondary | ICD-10-CM | POA: Diagnosis not present

## 2017-12-01 DIAGNOSIS — D631 Anemia in chronic kidney disease: Secondary | ICD-10-CM | POA: Diagnosis not present

## 2017-12-01 DIAGNOSIS — N186 End stage renal disease: Secondary | ICD-10-CM | POA: Diagnosis not present

## 2017-12-01 DIAGNOSIS — N2581 Secondary hyperparathyroidism of renal origin: Secondary | ICD-10-CM | POA: Diagnosis not present

## 2017-12-03 DIAGNOSIS — D509 Iron deficiency anemia, unspecified: Secondary | ICD-10-CM | POA: Diagnosis not present

## 2017-12-03 DIAGNOSIS — D631 Anemia in chronic kidney disease: Secondary | ICD-10-CM | POA: Diagnosis not present

## 2017-12-03 DIAGNOSIS — N2581 Secondary hyperparathyroidism of renal origin: Secondary | ICD-10-CM | POA: Diagnosis not present

## 2017-12-03 DIAGNOSIS — N186 End stage renal disease: Secondary | ICD-10-CM | POA: Diagnosis not present

## 2017-12-03 DIAGNOSIS — Z992 Dependence on renal dialysis: Secondary | ICD-10-CM | POA: Diagnosis not present

## 2017-12-05 DIAGNOSIS — Z992 Dependence on renal dialysis: Secondary | ICD-10-CM | POA: Diagnosis not present

## 2017-12-05 DIAGNOSIS — D631 Anemia in chronic kidney disease: Secondary | ICD-10-CM | POA: Diagnosis not present

## 2017-12-05 DIAGNOSIS — D509 Iron deficiency anemia, unspecified: Secondary | ICD-10-CM | POA: Diagnosis not present

## 2017-12-05 DIAGNOSIS — N186 End stage renal disease: Secondary | ICD-10-CM | POA: Diagnosis not present

## 2017-12-05 DIAGNOSIS — N2581 Secondary hyperparathyroidism of renal origin: Secondary | ICD-10-CM | POA: Diagnosis not present

## 2017-12-08 ENCOUNTER — Telehealth: Payer: Self-pay | Admitting: *Deleted

## 2017-12-08 DIAGNOSIS — Z992 Dependence on renal dialysis: Secondary | ICD-10-CM | POA: Diagnosis not present

## 2017-12-08 DIAGNOSIS — N186 End stage renal disease: Secondary | ICD-10-CM | POA: Diagnosis not present

## 2017-12-08 DIAGNOSIS — N2581 Secondary hyperparathyroidism of renal origin: Secondary | ICD-10-CM | POA: Diagnosis not present

## 2017-12-08 DIAGNOSIS — D631 Anemia in chronic kidney disease: Secondary | ICD-10-CM | POA: Diagnosis not present

## 2017-12-08 DIAGNOSIS — D509 Iron deficiency anemia, unspecified: Secondary | ICD-10-CM | POA: Diagnosis not present

## 2017-12-08 NOTE — Telephone Encounter (Signed)
Phone call from [place] at [facility]. She states she has sent multiple fax requesting this patient to be seen XI:[id number] with fistula, low flow. Today has had multiple cannulations and needs to be seen  At [facility], ASAP. Appt for 2/13 with Dr. Scot Dock. Heather to handle all  communications  with [facility] nursing to get patient here, and  Contact legal guardian.

## 2017-12-09 ENCOUNTER — Other Ambulatory Visit: Payer: Self-pay | Admitting: *Deleted

## 2017-12-09 ENCOUNTER — Encounter: Payer: Self-pay | Admitting: Vascular Surgery

## 2017-12-09 ENCOUNTER — Ambulatory Visit (INDEPENDENT_AMBULATORY_CARE_PROVIDER_SITE_OTHER): Payer: Medicare Other | Admitting: Vascular Surgery

## 2017-12-09 ENCOUNTER — Other Ambulatory Visit: Payer: Self-pay

## 2017-12-09 ENCOUNTER — Encounter: Payer: Self-pay | Admitting: *Deleted

## 2017-12-09 ENCOUNTER — Encounter (HOSPITAL_COMMUNITY): Payer: Self-pay | Admitting: *Deleted

## 2017-12-09 VITALS — BP 126/76 | HR 86 | Temp 97.3°F | Resp 18 | Ht 67.0 in | Wt 206.0 lb

## 2017-12-09 DIAGNOSIS — N186 End stage renal disease: Secondary | ICD-10-CM

## 2017-12-09 DIAGNOSIS — Z992 Dependence on renal dialysis: Secondary | ICD-10-CM | POA: Diagnosis not present

## 2017-12-09 NOTE — H&P (View-Only) (Signed)
   Patient name: Zachary [name] MRN: [id number] DOB: [birth year] Sex: male  REASON FOR VISIT:   To evaluate AV fistula  HPI:   JUDSON TSAN is a pleasant 72 y.o. male who I placed a right basilic vein transposition on on 02/26/2016.  This had been working well however yesterday after dialysis it stopped functioning.  He was sent to have this evaluated.  He dialyzes on Tuesdays Thursdays and Saturdays and is due for dialysis tomorrow.  I reviewed his medications he is not on Coumadin.  He had been on this in the past after a hip fracture.  He denies any recent uremic symptoms.  No current outpatient medications on file.   No current facility-administered medications for this visit.     REVIEW OF SYSTEMS:  [X]  denotes positive finding, [ ]  denotes negative finding Cardiac  Comments:  Chest pain or chest pressure:    Shortness of breath upon exertion:    Short of breath when lying flat:    Irregular heart rhythm:    Constitutional    Fever or chills:     PHYSICAL EXAM:   Vitals:   12/09/17 1319  BP: 126/76  Pulse: 86  Resp: 18  Temp: (!) 97.3 F (36.3 C)  TempSrc: Oral  SpO2: 99%  Weight: 206 lb (93.4 kg)  Height: 5\' 7"  (1.702 m)    GENERAL: The patient is a well-nourished male, in no acute distress. The vital signs are documented above. CARDIOVASCULAR: There is a regular rate and rhythm. PULMONARY: There is good air exchange bilaterally without wheezing or rales. His fistula has a weak thrill. He has a palpable right radial pulse.  The central portion of the fistula appears to be thrombosed.  DATA:   DUPLEX: I performed a duplex using the SonoSite at the bedside.  This confirms a largely thrombosed segment of the fistula in the mid upper arm.  MEDICAL ISSUES:   THROMBOSED RIGHT UPPER ARM AV FISTULA: The central portion of the fistula is thrombosed.  This is not functioning.  For this reason, I have recommended thrombectomy of the fistula in the operating room  tomorrow.  If we are not able to salvage the fistula then he would require placement of an AV graft and a tunneled dialysis catheter.  I have discussed this with the patient and his care provider today and they are agreeable to proceed tomorrow as he will need a way to be dialyzed.  [name] [facility] [phone]

## 2017-12-09 NOTE — Progress Notes (Signed)
Pt is a resident at [facility]. Pt's legal guardian, [name], social worker for [place] has called and telephone consent done with myself and IT sales professional, Therapist, sports. Spoke with Darcella Cheshire, LPN and pt's nurse at [facility]. She states pt has not had any recent infection, flu like symptoms or fever. Pt does not have a cardiac history. He is a type 2 diabetic. Last A1C was 6.8 on 09/01/17. Estill Bamberg states that his fasting blood sugar is usually between 180-222. I faxed pre-op instructions to [place] at [phone].

## 2017-12-09 NOTE — Pre-Procedure Instructions (Signed)
   Zachary [name]  12/09/2017    Zachary [name]' procedure is scheduled on Thursday, December 10, 2017 at 8:45 AM.   Report to [facility] Entrance "A" Admitting Office at 7:00 AM.   Call this number if you have problems the morning of surgery: [phone]   Remember:  Zachary [name] is not to eat food or drink liquids after midnight tonight.  Take these medicines the morning of surgery with A SIP OF WATER: Levothyroxine (Synthroid)  Please check patient's blood sugar when he wakes up in the AM and every 2 hours until he leaves for the hospital. If blood sugar is 70 or below, treat with 1/2 cup of clear juice (apple or cranberry) and recheck blood sugar 15 minutes after drinking juice. If blood sugar continues to be 70 or below, call the Short Stay department and ask to speak to a nurse.   Do not wear jewelry.  Do not wear lotions, powders, cologne or deodorant.  Men may shave face and neck.  Do not bring valuables to the hospital.  [facility] is not responsible for any belongings or valuables.  Contacts, dentures or bridgework may not be worn into surgery.  Leave your suitcase in the car.  After surgery it may be brought to your room.   Any questions this afternoon, please call me, Lilia Pro, RN at [phone].

## 2017-12-09 NOTE — Progress Notes (Signed)
   Patient name: Zachary [name] MRN: [id number] DOB: December 18, 1945 Sex: male  REASON FOR VISIT:   To evaluate AV fistula  HPI:   Zachary [name] is a pleasant 72 y.o. male who I placed a right basilic vein transposition on on 02/26/2016.  This had been working well however yesterday after dialysis it stopped functioning.  He was sent to have this evaluated.  He dialyzes on Tuesdays Thursdays and Saturdays and is due for dialysis tomorrow.  I reviewed his medications he is not on Coumadin.  He had been on this in the past after a hip fracture.  He denies any recent uremic symptoms.  No current outpatient medications on file.   No current facility-administered medications for this visit.     REVIEW OF SYSTEMS:  [X]  denotes positive finding, [ ]  denotes negative finding Cardiac  Comments:  Chest pain or chest pressure:    Shortness of breath upon exertion:    Short of breath when lying flat:    Irregular heart rhythm:    Constitutional    Fever or chills:     PHYSICAL EXAM:   Vitals:   12/09/17 1319  BP: 126/76  Pulse: 86  Resp: 18  Temp: (!) 97.3 F (36.3 C)  TempSrc: Oral  SpO2: 99%  Weight: 206 lb (93.4 kg)  Height: 5\' 7"  (1.702 m)    GENERAL: The patient is a well-nourished male, in no acute distress. The vital signs are documented above. CARDIOVASCULAR: There is a regular rate and rhythm. PULMONARY: There is good air exchange bilaterally without wheezing or rales. His fistula has a weak thrill. He has a palpable right radial pulse.  The central portion of the fistula appears to be thrombosed.  DATA:   DUPLEX: I performed a duplex using the SonoSite at the bedside.  This confirms a largely thrombosed segment of the fistula in the mid upper arm.  MEDICAL ISSUES:   THROMBOSED RIGHT UPPER ARM AV FISTULA: The central portion of the fistula is thrombosed.  This is not functioning.  For this reason, I have recommended thrombectomy of the fistula in the operating room  tomorrow.  If we are not able to salvage the fistula then he would require placement of an AV graft and a tunneled dialysis catheter.  I have discussed this with the patient and his care provider today and they are agreeable to proceed tomorrow as he will need a way to be dialyzed.  [name] [facility] [phone]

## 2017-12-10 ENCOUNTER — Non-Acute Institutional Stay (SKILLED_NURSING_FACILITY): Payer: Medicare Other | Admitting: Internal Medicine

## 2017-12-10 ENCOUNTER — Ambulatory Visit (HOSPITAL_COMMUNITY): Payer: Medicare Other | Admitting: Anesthesiology

## 2017-12-10 ENCOUNTER — Encounter: Payer: Self-pay | Admitting: Internal Medicine

## 2017-12-10 ENCOUNTER — Encounter (HOSPITAL_COMMUNITY): Payer: Self-pay | Admitting: *Deleted

## 2017-12-10 ENCOUNTER — Inpatient Hospital Stay
Admission: RE | Admit: 2017-12-10 | Discharge: 2018-01-19 | Disposition: A | Discharge status: Nursing Home (Includes ICF) | Payer: Medicare Other | Source: Ambulatory Visit | Attending: Internal Medicine | Admitting: Internal Medicine

## 2017-12-10 ENCOUNTER — Ambulatory Visit (HOSPITAL_COMMUNITY)
Admission: RE | Admit: 2017-12-10 | Discharge: 2017-12-10 | Disposition: A | Discharge status: Skilled Nursing Facility | Payer: Medicare Other | Source: Ambulatory Visit | Attending: Vascular Surgery | Admitting: Vascular Surgery

## 2017-12-10 ENCOUNTER — Telehealth: Payer: Self-pay | Admitting: *Deleted

## 2017-12-10 ENCOUNTER — Encounter (HOSPITAL_COMMUNITY)
Admission: RE | Disposition: A | Discharge status: Skilled Nursing Facility | Payer: Self-pay | Source: Ambulatory Visit | Attending: Vascular Surgery

## 2017-12-10 DIAGNOSIS — Z8679 Personal history of other diseases of the circulatory system: Secondary | ICD-10-CM | POA: Diagnosis not present

## 2017-12-10 DIAGNOSIS — I120 Hypertensive chronic kidney disease with stage 5 chronic kidney disease or end stage renal disease: Secondary | ICD-10-CM | POA: Diagnosis not present

## 2017-12-10 DIAGNOSIS — N186 End stage renal disease: Secondary | ICD-10-CM | POA: Diagnosis not present

## 2017-12-10 DIAGNOSIS — E1121 Type 2 diabetes mellitus with diabetic nephropathy: Secondary | ICD-10-CM | POA: Diagnosis not present

## 2017-12-10 DIAGNOSIS — T82868A Thrombosis of vascular prosthetic devices, implants and grafts, initial encounter: Secondary | ICD-10-CM | POA: Diagnosis not present

## 2017-12-10 DIAGNOSIS — Y832 Surgical operation with anastomosis, bypass or graft as the cause of abnormal reaction of the patient, or of later complication, without mention of misadventure at the time of the procedure: Secondary | ICD-10-CM | POA: Insufficient documentation

## 2017-12-10 DIAGNOSIS — N185 Chronic kidney disease, stage 5: Secondary | ICD-10-CM | POA: Diagnosis not present

## 2017-12-10 DIAGNOSIS — T82898A Other specified complication of vascular prosthetic devices, implants and grafts, initial encounter: Secondary | ICD-10-CM | POA: Diagnosis not present

## 2017-12-10 DIAGNOSIS — K219 Gastro-esophageal reflux disease without esophagitis: Secondary | ICD-10-CM | POA: Diagnosis not present

## 2017-12-10 DIAGNOSIS — E1122 Type 2 diabetes mellitus with diabetic chronic kidney disease: Secondary | ICD-10-CM | POA: Diagnosis not present

## 2017-12-10 DIAGNOSIS — I129 Hypertensive chronic kidney disease with stage 1 through stage 4 chronic kidney disease, or unspecified chronic kidney disease: Secondary | ICD-10-CM | POA: Diagnosis not present

## 2017-12-10 DIAGNOSIS — F419 Anxiety disorder, unspecified: Secondary | ICD-10-CM | POA: Insufficient documentation

## 2017-12-10 DIAGNOSIS — D638 Anemia in other chronic diseases classified elsewhere: Secondary | ICD-10-CM

## 2017-12-10 DIAGNOSIS — E039 Hypothyroidism, unspecified: Secondary | ICD-10-CM | POA: Diagnosis not present

## 2017-12-10 DIAGNOSIS — Z9889 Other specified postprocedural states: Secondary | ICD-10-CM

## 2017-12-10 HISTORY — PX: THROMBECTOMY W/ EMBOLECTOMY: SHX2507

## 2017-12-10 HISTORY — PX: PATCH ANGIOPLASTY: SHX6230

## 2017-12-10 LAB — POCT I-STAT 4, (NA,K, GLUC, HGB,HCT)
Glucose, Bld: 160 mg/dL — ABNORMAL HIGH (ref 65–99)
HCT: 34 % — ABNORMAL LOW (ref 39.0–52.0)
Hemoglobin: 11.6 g/dL — ABNORMAL LOW (ref 13.0–17.0)
Potassium: 5.3 mmol/L — ABNORMAL HIGH (ref 3.5–5.1)
Sodium: 140 mmol/L (ref 135–145)

## 2017-12-10 LAB — GLUCOSE, CAPILLARY: Glucose-Capillary: 147 mg/dL — ABNORMAL HIGH (ref 65–99)

## 2017-12-10 SURGERY — THROMBECTOMY ARTERIOVENOUS FISTULA
Anesthesia: General | Site: Arm Upper | Laterality: Right

## 2017-12-10 MED ORDER — ONDANSETRON HCL 4 MG/2ML IJ SOLN
INTRAMUSCULAR | Status: DC | PRN
  Administered 2017-12-10: 4 mg via INTRAVENOUS

## 2017-12-10 MED ORDER — HEPARIN SODIUM (PORCINE) 1000 UNIT/ML IJ SOLN
INTRAMUSCULAR | Status: DC | PRN
  Administered 2017-12-10: 8000 [IU] via INTRAVENOUS

## 2017-12-10 MED ORDER — MIDAZOLAM HCL 2 MG/2ML IJ SOLN
INTRAMUSCULAR | Status: AC
  Filled 2017-12-10: qty 2

## 2017-12-10 MED ORDER — FENTANYL CITRATE (PF) 250 MCG/5ML IJ SOLN
INTRAMUSCULAR | Status: AC
  Filled 2017-12-10: qty 5

## 2017-12-10 MED ORDER — LIDOCAINE HCL (PF) 1 % IJ SOLN
INTRAMUSCULAR | Status: AC
  Filled 2017-12-10: qty 30

## 2017-12-10 MED ORDER — DEXAMETHASONE SODIUM PHOSPHATE 10 MG/ML IJ SOLN
INTRAMUSCULAR | Status: DC | PRN
  Administered 2017-12-10: 4 mg via INTRAVENOUS

## 2017-12-10 MED ORDER — HEPARIN SODIUM (PORCINE) 1000 UNIT/ML IJ SOLN
INTRAMUSCULAR | Status: AC
  Filled 2017-12-10: qty 1

## 2017-12-10 MED ORDER — FENTANYL CITRATE (PF) 100 MCG/2ML IJ SOLN
INTRAMUSCULAR | Status: DC | PRN
  Administered 2017-12-10: 100 ug via INTRAVENOUS
  Administered 2017-12-10: 25 ug via INTRAVENOUS

## 2017-12-10 MED ORDER — PROTAMINE SULFATE 10 MG/ML IV SOLN
INTRAVENOUS | Status: DC | PRN
  Administered 2017-12-10: 30 mg via INTRAVENOUS
  Administered 2017-12-10: 10 mg via INTRAVENOUS

## 2017-12-10 MED ORDER — SODIUM CHLORIDE 0.9 % IV SOLN
INTRAVENOUS | Status: DC | PRN
  Administered 2017-12-10: 500 mL

## 2017-12-10 MED ORDER — LIDOCAINE 2% (20 MG/ML) 5 ML SYRINGE
INTRAMUSCULAR | Status: DC | PRN
  Administered 2017-12-10: 100 mg via INTRAVENOUS

## 2017-12-10 MED ORDER — SODIUM CHLORIDE 0.9 % IV SOLN
INTRAVENOUS | Status: DC
  Administered 2017-12-10: 10:00:00 via INTRAVENOUS

## 2017-12-10 MED ORDER — SODIUM CHLORIDE 0.9 % IV SOLN
1.5000 g | INTRAVENOUS | Status: AC
  Administered 2017-12-10: 1.5 g via INTRAVENOUS
  Filled 2017-12-10: qty 1.5

## 2017-12-10 MED ORDER — PROPOFOL 10 MG/ML IV BOLUS
INTRAVENOUS | Status: DC | PRN
  Administered 2017-12-10: 200 mg via INTRAVENOUS

## 2017-12-10 MED ORDER — OXYCODONE-ACETAMINOPHEN 5-325 MG PO TABS: 1.0000 | ORAL_TABLET | Freq: Four times a day (QID) | ORAL | 0 refills | Status: DC | PRN

## 2017-12-10 MED ORDER — 0.9 % SODIUM CHLORIDE (POUR BTL) OPTIME
TOPICAL | Status: DC | PRN
  Administered 2017-12-10: 1000 mL

## 2017-12-10 MED ORDER — LIDOCAINE-EPINEPHRINE (PF) 1 %-1:200000 IJ SOLN
INTRAMUSCULAR | Status: AC
  Filled 2017-12-10: qty 30

## 2017-12-10 MED ORDER — PHENYLEPHRINE 40 MCG/ML (10ML) SYRINGE FOR IV PUSH (FOR BLOOD PRESSURE SUPPORT)
PREFILLED_SYRINGE | INTRAVENOUS | Status: DC | PRN
  Administered 2017-12-10 (×2): 80 ug via INTRAVENOUS
  Administered 2017-12-10 (×2): 40 ug via INTRAVENOUS
  Administered 2017-12-10 (×2): 80 ug via INTRAVENOUS

## 2017-12-10 MED ORDER — PHENYLEPHRINE HCL 10 MG/ML IJ SOLN
INTRAMUSCULAR | Status: DC | PRN
  Administered 2017-12-10: 50 ug/min via INTRAVENOUS

## 2017-12-10 SURGICAL SUPPLY — 70 items
ADH SKN CLS APL DERMABOND .7 (GAUZE/BANDAGES/DRESSINGS) ×3
ARMBAND PINK RESTRICT EXTREMIT (MISCELLANEOUS) ×4 IMPLANT
BAG DECANTER FOR FLEXI CONT (MISCELLANEOUS) IMPLANT
BIOPATCH RED 1 DISK 7.0 (GAUZE/BANDAGES/DRESSINGS) IMPLANT
CANISTER SUCT 3000ML PPV (MISCELLANEOUS) ×4 IMPLANT
CANNULA VESSEL 3MM 2 BLNT TIP (CANNULA) ×4 IMPLANT
CATH EMB 4FR 40CM (CATHETERS) ×4 IMPLANT
CATH EMB 4FR 80CM (CATHETERS) IMPLANT
CATH PALINDROME RT-P 15FX19CM (CATHETERS) IMPLANT
CATH PALINDROME RT-P 15FX23CM (CATHETERS) IMPLANT
CATH PALINDROME RT-P 15FX28CM (CATHETERS) IMPLANT
CATH PALINDROME RT-P 15FX55CM (CATHETERS) IMPLANT
CHLORAPREP W/TINT 26ML (MISCELLANEOUS) IMPLANT
CLIP VESOCCLUDE MED 6/CT (CLIP) ×4 IMPLANT
CLIP VESOCCLUDE SM WIDE 6/CT (CLIP) ×8 IMPLANT
COVER PROBE W GEL 5X96 (DRAPES) IMPLANT
COVER SURGICAL LIGHT HANDLE (MISCELLANEOUS) IMPLANT
DECANTER SPIKE VIAL GLASS SM (MISCELLANEOUS) IMPLANT
DERMABOND ADVANCED (GAUZE/BANDAGES/DRESSINGS) ×1
DERMABOND ADVANCED .7 DNX12 (GAUZE/BANDAGES/DRESSINGS) ×3 IMPLANT
DRAPE C-ARM 42X72 X-RAY (DRAPES) IMPLANT
DRAPE CHEST BREAST 15X10 FENES (DRAPES) IMPLANT
DRAPE X-RAY CASS 24X20 (DRAPES) IMPLANT
ELECT REM PT RETURN 9FT ADLT (ELECTROSURGICAL) ×4
ELECTRODE REM PT RTRN 9FT ADLT (ELECTROSURGICAL) ×3 IMPLANT
GAUZE SPONGE 4X4 16PLY XRAY LF (GAUZE/BANDAGES/DRESSINGS) IMPLANT
GLOVE BIO SURGEON STRL SZ7.5 (GLOVE) ×4 IMPLANT
GLOVE BIOGEL PI IND STRL 6.5 (GLOVE) ×6 IMPLANT
GLOVE BIOGEL PI IND STRL 7.0 (GLOVE) ×3 IMPLANT
GLOVE BIOGEL PI IND STRL 7.5 (GLOVE) ×3 IMPLANT
GLOVE BIOGEL PI IND STRL 8 (GLOVE) ×3 IMPLANT
GLOVE BIOGEL PI INDICATOR 6.5 (GLOVE) ×2
GLOVE BIOGEL PI INDICATOR 7.0 (GLOVE) ×1
GLOVE BIOGEL PI INDICATOR 7.5 (GLOVE) ×1
GLOVE BIOGEL PI INDICATOR 8 (GLOVE) ×1
GLOVE ECLIPSE 6.5 STRL STRAW (GLOVE) ×4 IMPLANT
GLOVE ECLIPSE 7.0 STRL STRAW (GLOVE) ×4 IMPLANT
GLOVE SURG SS PI 6.5 STRL IVOR (GLOVE) ×4 IMPLANT
GLOVE SURG SS PI 7.0 STRL IVOR (GLOVE) ×4 IMPLANT
GOWN STRL NON-REIN LRG LVL3 (GOWN DISPOSABLE) ×4 IMPLANT
GOWN STRL REUS W/ TWL LRG LVL3 (GOWN DISPOSABLE) ×12 IMPLANT
GOWN STRL REUS W/TWL LRG LVL3 (GOWN DISPOSABLE) ×16
KIT BASIN OR (CUSTOM PROCEDURE TRAY) ×4 IMPLANT
KIT ROOM TURNOVER OR (KITS) ×4 IMPLANT
NEEDLE 18GX1X1/2 (RX/OR ONLY) (NEEDLE) IMPLANT
NEEDLE HYPO 25GX1X1/2 BEV (NEEDLE) IMPLANT
NS IRRIG 1000ML POUR BTL (IV SOLUTION) ×4 IMPLANT
PACK CV ACCESS (CUSTOM PROCEDURE TRAY) ×4 IMPLANT
PACK SURGICAL SETUP 50X90 (CUSTOM PROCEDURE TRAY) IMPLANT
PAD ARMBOARD 7.5X6 YLW CONV (MISCELLANEOUS) ×8 IMPLANT
PATCH VASC XENOSURE 1CMX6CM (Vascular Products) ×4 IMPLANT
PATCH VASC XENOSURE 1X6 (Vascular Products) ×3 IMPLANT
SET COLLECT BLD 21X3/4 12 (NEEDLE) IMPLANT
SPONGE SURGIFOAM ABS GEL 100 (HEMOSTASIS) IMPLANT
STOPCOCK 4 WAY LG BORE MALE ST (IV SETS) IMPLANT
SUT ETHILON 3 0 PS 1 (SUTURE) IMPLANT
SUT PROLENE 6 0 BV (SUTURE) ×8 IMPLANT
SUT VIC AB 3-0 SH 27 (SUTURE) ×4
SUT VIC AB 3-0 SH 27X BRD (SUTURE) ×3 IMPLANT
SUT VICRYL 4-0 PS2 18IN ABS (SUTURE) ×4 IMPLANT
SYR 10ML LL (SYRINGE) IMPLANT
SYR 20CC LL (SYRINGE) IMPLANT
SYR 5ML LL (SYRINGE) IMPLANT
SYR CONTROL 10ML LL (SYRINGE) IMPLANT
SYR TOOMEY 50ML (SYRINGE) IMPLANT
TOWEL GREEN STERILE (TOWEL DISPOSABLE) ×4 IMPLANT
TOWEL GREEN STERILE FF (TOWEL DISPOSABLE) ×4 IMPLANT
TUBING EXTENTION W/L.L. (IV SETS) IMPLANT
UNDERPAD 30X30 (UNDERPADS AND DIAPERS) ×4 IMPLANT
WATER STERILE IRR 1000ML POUR (IV SOLUTION) ×4 IMPLANT

## 2017-12-10 NOTE — Telephone Encounter (Signed)
-----   Message from Angelia Mould, MD sent at 12/10/2017 12:26 PM EST ----- Regarding: charge  PROCEDURE:   Thrombectomy and revision of right basilic vein transposition (bovine pericardial patch angioplasty of proximal fistula)  SURGEON: Judeth Cornfield. Scot Dock, MD, FACS  ASSIST: Gerri Lins PA   If this graft occludes in the future he would need placement of a tunneled dialysis catheter and a new right arm AV graft.  This fistula has multiple problems.  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  There are multiple areas of stenosis downstream.  I address the intimal hyperplasia in the proximal fistula.  The only other consideration would be a fistulogram and venoplasty if he has problems in the future but the fistula remains patent.

## 2017-12-10 NOTE — Anesthesia Postprocedure Evaluation (Signed)
Anesthesia Post Note  Patient: Zachary [name]  Procedure(s) Performed: THROMBECTOMY REVISION RIGHT ARM  ARTERIOVENOUS FISTULA (Right Arm Upper) PATCH ANGIOPLASTY (Right Arm Upper)     Patient location during evaluation: PACU Anesthesia Type: General Level of consciousness: sedated Pain management: pain level controlled Vital Signs Assessment: post-procedure vital signs reviewed and stable Respiratory status: spontaneous breathing and respiratory function stable Cardiovascular status: stable Postop Assessment: no apparent nausea or vomiting Anesthetic complications: no    Last Vitals:  Vitals:   12/10/17 1300 12/10/17 1339  BP:  131/71  Pulse: 94 91  Resp: 10 12  Temp:  36.7 C  SpO2: 100% 99%    Last Pain:  Vitals:   12/10/17 1300  TempSrc:   PainSc: 0-No pain                 Ahmadou Bolz DANIEL

## 2017-12-10 NOTE — Progress Notes (Signed)
Location:   [facility] Room Number: 112/D Place of Service:  SNF (31) Provider:  Glennon Hamilton, MD  Patient Care Team: [name], MD as PCP - General (Internal Medicine) Cassandria Anger, MD as Consulting Physician (Endocrinology) Fran Lowes, MD as Consulting Physician (Nephrology) Gala Romney Cristopher Estimable, MD as Consulting Physician (Gastroenterology)  Extended Emergency Contact Information Primary Emergency Contact: Betsey Amen Address: DSS  Johnnette Litter of [place] Phone: [phone] Work Phone: [phone] Relation: Legal Guardian Secondary Emergency Contact: Paye,Richard Address: [address]          [place], [place] [address] [place] Phone: [phone] Relation: Brother  Code Status:  Full Code Goals of care: Advanced Directive information Advanced Directives 12/10/2017  Does Patient Have a Medical Advance Directive? Yes  Type of Advance Directive (No Data)  Does patient want to make changes to medical advance directive? No - Patient declined  Copy of [place] in Chart? No - copy requested  Would patient like information on creating a medical advance directive? No - Patient declined  Pre-existing out of facility DNR order (yellow form or pink MOST form) -     Chief complaint-acute visit status post thrombosis   removal in dialysis fistula   History of present illness.  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  Apparently earlier this week his fistula stopped functioning.--He was found to have a thrombosed right upper arm AV fistula.  And thrombectomy was  recommended.  This was performed earlier today it appears successfully by Dr. Scot Dock--- and he has returned to the facility.  He is not really complaining of any pain or discomfort vital signs are stable he  apparently tolerated this procedure very well   He will be receiving dialysis apparently tomorrow and over the weekend as well-he is not complaining of any shortness of breath-he does have some lower extremity edema- but this does not appear to be acutely changed from baseline.  His other medical issues appear to be stable it appears hemoglobin done today was stable at 11.6 his potassium was borderline elevated at 5.3 and again he will be receiving dialysis tomorrow.             a      Past Medical History:  Diagnosis Date  . Abnormal CT scan, kidney 10/06/2011  . Acute pyelonephritis 10/07/2011  . Anemia    normocytic  . Anxiety    mental retardation  . Bladder wall thickening 10/06/2011  . BPH (benign prostatic hypertrophy)   . Diabetes mellitus   . Dialysis patient [facility])    Tuesday, Thursday and Saturday,   . Edema     history of lower extremity edema  . GERD (gastroesophageal reflux disease)   . Heme positive stool   . Hydronephrosis   . Hyperkalemia   . Hyperlipidemia   . Hypernatremia   . Hypertension   . Hypothyroidism   . Impaired speech   . 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 MR (mental retardation)   . Muscle weakness   . Obstructive uropathy   . 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 Poor historian poor historian  . Protein calorie malnutrition ([place])   .  Pyelonephritis   . Renal failure (ARF), acute on chronic (HCC)   . Renal insufficiency    chronic history  . Sepsis ([place])   . Smoking   . Uremia   . Urinary retention   . UTI (lower urinary tract infection) 10/06/2011   Past Surgical History:  Procedure Laterality Date  . AV FISTULA PLACEMENT Left 07/06/2015   Procedure:  INSERTION LEFT ARM ARTERIOVENOUS GORTEX GRAFT;  Surgeon:  Angelia Mould, MD;  Location: [facility];  Service: Vascular;  Laterality: Left;  . AV FISTULA PLACEMENT Right 02/26/2016   Procedure: ARTERIOVENOUS (AV) FISTULA CREATION ;  Surgeon: Angelia Mould, MD;  Location: [facility];  Service: Vascular;  Laterality: Right;  . [place] REMOVAL Left 10/09/2015   Procedure: REMOVAL OF ARTERIOVENOUS GORETEX GRAFT ([place]) Evacuation of Lymphocele, Vein Patch angioplasty of brachial artery.;  Surgeon: Angelia Mould, MD;  Location: [facility] [place];  Service: Vascular;  Laterality: Left;  . BASCILIC VEIN TRANSPOSITION Right 02/26/2016   Procedure: Right BASCILIC VEIN TRANSPOSITION;  Surgeon: Angelia Mould, MD;  Location: [facility] [facility];  Service: Vascular;  Laterality: Right;  . CIRCUMCISION N/A 01/04/2014   Procedure: CIRCUMCISION ADULT (procedure #1);  Surgeon: Marissa Nestle, MD;  [facility];  Service: Urology;  Laterality: N/A;  . COLECTOMY N/A 05/04/2017   Procedure: TOTAL COLECTOMY;  Surgeon: Aviva Signs, MD;  [facility];  Service: General;  Laterality: N/A;  . COLONOSCOPY N/A 04/27/2017   Procedure: COLONOSCOPY;  Surgeon: Daneil Dolin, MD;  [facility];  Service: Endoscopy;  Laterality: N/A;  245  . CYSTOSCOPY W/ RETROGRADES Bilateral 06/29/2015   Procedure: CYSTOSCOPY, DILATION OF URETHRAL STRICTURE WITH BILATERAL RETROGRADE PYELOGRAM,SUPRAPUBIC TUBE CHANGE;  Surgeon: Festus Aloe, MD;  Location[facility];  Service: Urology;  Laterality: Bilateral;  . CYSTOSCOPY WITH URETHRAL DILATATION N/A 12/29/2013   Procedure: CYSTOSCOPY WITH URETHRAL DILATATION;  Surgeon: Marissa Nestle, MD;  [facility];  Service: Urology;  Laterality: N/A;  . ESOPHAGOGASTRODUODENOSCOPY N/A 04/27/2017   Procedure: ESOPHAGOGASTRODUODENOSCOPY (EGD);  Surgeon: Daneil Dolin, MD;  [facility];  Service: Endoscopy;  Laterality: N/A;  . ORIF FEMUR FRACTURE Right 11/22/2016   Procedure: OPEN REDUCTION INTERNAL FIXATION (ORIF) DISTAL FEMUR FRACTURE;   Surgeon: Rod Can, MD;  Location: [facility];  Service: Orthopedics;  Laterality: Right;  . PERIPHERAL VASCULAR CATHETERIZATION N/A 10/08/2015   Procedure: A/V Shuntogram;  Surgeon: Angelia Mould, MD;  Location: [facility];  Service: Cardiovascular;  Laterality: N/A;  . TRANSURETHRAL RESECTION OF PROSTATE N/A 01/04/2014   Procedure: TRANSURETHRAL RESECTION OF THE PROSTATE (TURP) (procedure #2);  Surgeon: Marissa Nestle, MD;  [facility];  Service: Urology;  Laterality: N/A;    No Known Allergies  Outpatient Encounter Medications as of 12/10/2017  Medication Sig  . Amino Acids-Protein Hydrolys (FEEDING SUPPLEMENT, PRO-STAT 64,) LIQD Take 30 mLs by mouth 2 (two) times daily between meals.  Marland Kitchen atorvastatin (LIPITOR) 40 MG tablet Take 40 mg by mouth every evening.  Marland Kitchen levothyroxine (SYNTHROID, LEVOTHROID) 75 MCG tablet Take 75 mcg by mouth daily before breakfast.   . linagliptin (TRADJENTA) 5 MG TABS tablet Take 5 mg by mouth every evening.   . midodrine (PROAMATINE) 10 MG tablet Take 10 mg by mouth. Send medication with patient on Tue.Thu., Sat.,to dialysis do not give at [facility] give to take with him  . Multiple Vitamin (MULTIVITAMIN WITH MINERALS) TABS tablet Take 1 tablet by mouth every evening.   . Neo-Bacit-Poly-Lidocaine (  FIRST AID PLUS LIDOCAINE EX) Apply 1 application topically Every Tuesday,Thursday,and Saturday with dialysis. Prior to dialysis  . Nutritional Supplements (NEPRO/CARBSTEADY PO) Take 1 Can by mouth 2 (two) times daily.  Marland Kitchen omeprazole (PRILOSEC) 40 MG capsule Take 40 mg by mouth at bedtime.   . sevelamer carbonate (RENVELA) 800 MG tablet Take 800 mg by mouth 2 (two) times daily.   . sevelamer carbonate (RENVELA) 800 MG tablet Take 2,400 mg by mouth 3 (three) times daily with meals.  . tamsulosin (FLOMAX) 0.4 MG CAPS capsule Take 0.4 mg by mouth every evening. Give 30 minutes after a meal  . torsemide (DEMADEX) 10 MG tablet Take 5 tablets (50 mg total)  by mouth daily.  . [DISCONTINUED] oxyCODONE-acetaminophen (PERCOCET/ROXICET) 5-325 MG tablet Take 1 tablet by mouth every 6 (six) hours as needed.   Facility-Administered Encounter Medications as of 12/10/2017  Medication  . 0.9 %  sodium chloride infusion    Review of Systems   This is somewhat limited secondary to some cognitive issues which are baseline but he is not complaining of any fever chills shortness of breath chest pain or pain at the fistula site -he says he is hungry which is a chronic comment-he continues to have very good appetite  Immunization History  Administered Date(s) Administered  . Influenza,inj,Quad PF,6+ Mos 12/26/2013, 07/09/2015  . Influenza-Unspecified 07/31/2017  . PPD Test 03/07/2015  . Pneumococcal Conjugate-13 07/31/2017  . Pneumococcal Polysaccharide-23 03/04/2015  . Tdap 08/22/2017   Pertinent  Health Maintenance Due  Topic Date Due  . OPHTHALMOLOGY EXAM  12/14/2017 (Originally 08/23/1956)  . URINE MICROALBUMIN  12/14/2017 (Originally 08/23/1956)  . HEMOGLOBIN A1C  03/01/2018  . FOOT EXAM  07/22/2018  . COLONOSCOPY  04/28/2027  . INFLUENZA VACCINE  Completed  . PNA vac Low Risk Adult  Completed   No flowsheet data found. Functional Status Survey:    Vitals:   12/10/17 1549  BP: 137/77  Pulse: 94  Resp: 20  Temp: 97.9 F (36.6 C)  TempSrc: Oral  SpO2: 98%    Physical Exam  In general this continues to be a pleasant middle-age male in no distress lying comfortably in bed.  Skin is warm and dry at site of fistula right upper arm there is well-healed scarring at the site there is just a very minimal amount of serous drainage there is not really tenderness to palpation of the area no sign of infection-radial pulses intact bruit is appreciated.  Chest is clear to auscultation there is no labored breathing.  Heart is regular rate and rhythm without murmur gallop or rub appears to have relatively baseline lower extremity edema.  His  abdomen is soft nontender with positive bowel sounds it is somewhat obese.  Musculoskeletal Limited exam since he is in bed but appears able to move his extremities at baseline has lower extremity weakness which is not new-grip strength is strong bilaterally.  Neurologic appears to be grossly intact could not really appreciate lateralizing findings.  Psych he is oriented to self is pleasant appropriate answer simple questions without difficulty   Labs reviewed: Recent Labs    06/07/17 0800 06/08/17 0519 06/10/17 0727  07/13/17 0700 09/01/17 0731 11/14/17 0435 12/10/17 0950  NA 135 134* 137   < > 136 138 146* 140  K 3.1* 3.5 3.2*   < > 4.1 4.2 5.9* 5.3*  CL 97* 97* 100*   < > 100* 105 113*  --   CO2 27 27 26    < > 26  20* 22  --   GLUCOSE 100* 96 146*   < > 146* 196* 142* 160*  BUN 43* 50* 34*   < > 43* 108* 55*  --   CREATININE 5.53* 5.68* 4.96*   < > 6.50* 8.56* 8.69*  --   CALCIUM 7.9* 8.0* 8.4*   < > 8.7* 8.4* 8.7*  --   PHOS 5.3* 5.1* 3.7  --   --   --   --   --    < > = values in this interval not displayed.   Recent Labs    05/17/17 1558 05/18/17 1026  06/03/17 0055  06/07/17 0800 06/08/17 0519 06/10/17 0727  AST 36 29  --  16  --   --   --   --   ALT 17 15*  --  15*  --   --   --   --   ALKPHOS 102 92  --  87  --   --   --   --   BILITOT 1.0 0.9  --  0.5  --   --   --   --   PROT 5.9* 5.3*  --  6.5  --   --   --   --   ALBUMIN 2.3* 2.0*   < > 2.4*   < > 2.0* 2.0* 2.1*   < > = values in this interval not displayed.   Recent Labs    07/13/17 0700 09/01/17 0731 11/14/17 0435 12/10/17 0950  WBC 7.1 5.8 5.2  --   NEUTROABS 4.1 3.3 2.6  --   HGB 12.6* 11.7* 10.3* 11.6*  HCT 39.6 35.7* 31.3* 34.0*  MCV 100.3* 100.3* 109.1*  --   PLT 181 149* 130*  --    Lab Results  Component Value Date   TSH 2.197 11/14/2017   Lab Results  Component Value Date   HGBA1C 6.8 (H) 09/01/2017   Lab Results  Component Value Date   CHOL 104 07/13/2017   HDL 34 (L)  07/13/2017   LDLCALC 28 07/13/2017   TRIG 208 (H) 07/13/2017   CHOLHDL 3.1 07/13/2017    Significant Diagnostic Results in last 30 days:  No results found.  Assessment/Plan #1-history of thrombectomy of right upper arm fistula with history of end-stage renal disease on dialysis -he appears to have done quite well with this it appears procedure was successful-he will be receiving dialysis tomorrow- note  potassium is minimally elevated at 5.3 and this will be addressed at dialysis nephrology has been following him as well.  site of fistula looks stable-he is not complaining of pain He continues on Demadex as well with a history of edema this is managed by dialysis  2.  History of anemia with history of chronic kidney disease hemoglobin shows stability at 11.6 on lab done today.  3.  History of diabetes type 2 --he is on Tradjenta-this appears somewhat stable CBGs appear to be largely in the higher 100s occasionally slightly above 200 but this is not consistent it was 154 this morning--at times this is a challenge since patient has a very good appetite and is not always compliant--hemoglobin A1c in November 2018 was 6.8-at this point will need to monitor--but may need adjustments if consistent elevations  [id number]-of note greater than 25 minutes spent assessing patient- reviewing his consult notes surgical notes-labs- and coordinating plan of care.

## 2017-12-10 NOTE — Telephone Encounter (Signed)
Note for future surgical planning.

## 2017-12-10 NOTE — OR Nursing (Signed)
Handoff report called to [facility] [phone].  Report was given to [place] over the telephone.  Discharge instructions and medicines reviewed.  All questions answered. Pt will be prepared for discharge and await transportation via [employer].

## 2017-12-10 NOTE — Op Note (Signed)
    NAME: Zachary [name]    MRN: [id number] DOB: Feb 20, 1946    DATE OF OPERATION: 12/10/2017  PREOP DIAGNOSIS:    End-stage renal disease  POSTOP DIAGNOSIS:    Same  PROCEDURE:    Thrombectomy and revision of right basilic vein transposition (bovine pericardial patch angioplasty of proximal fistula)  SURGEON: Judeth Cornfield. Scot Dock, MD, FACS  ASSIST: Gerri Lins PA  ANESTHESIA: General  EBL: Minimal  INDICATIONS:    [name] is a 72 y.o. male who has a right basilic vein transposition which occluded.  He presents for attempted salvage of the fistula.  FINDINGS:   There was marked intimal hyperplasia over a long length in the proximal fistula.  This was addressed with a bovine pericardial patch.  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  In pulling the Fogarty catheter through the fistula there were multiple areas of irregularity.  If this graft clots in the future I would recommend placement of a tunneled dialysis catheter and a right upper arm graft.  TECHNIQUE:   The patient was taken to the operating room and received a general anesthetic.  The right arm was prepped and draped in the usual sterile fashion.  An incision was made over the proximal fistula and here the vein was sclerotic with marked intimal hyperplasia.  I dissected proximally distally to get to more normal-appearing vein.  Proximally the dissection was carried down to the proximal anastomosis.  The patient was heparinized.  The fistula was clamped proximally and then a longitudinal venotomy was made.  Thrombectomy was performed using a #4 Fogarty catheter.  And pulling the catheter through the fistula there was areas of irregularity in multiple areas.  There was marked intimal hyperplasia in the proximal fistula.  I elected to address this with bovine pericardial patch angioplasty.  The patch was tailored and sewn with running 6-0 Prolene suture.  At the completion there was a good  thrill in the fistula.  Hemostasis was obtained in the wound and the heparin was partially reversed with protamine.  The wound was closed with a deep layer of 3-0 Vicryl in a subcutaneous layer of 4-0 Vicryl.  Dermabond was applied.  Patient tolerated the procedure well was transferred to the recovery room in stable condition.  All needle and sponge counts were correct.  [name], MD, FACS [facility]  DATE OF DICTATION:   12/10/2017

## 2017-12-10 NOTE — Anesthesia Preprocedure Evaluation (Signed)
Anesthesia Evaluation  Patient identified by MRN, date of birth, ID band Patient confused    Reviewed: Allergy & Precautions, NPO status , Patient's Chart, lab work & pertinent test results  History of Anesthesia Complications Negative for: history of anesthetic complications  Airway Mallampati: II  TM Distance: >3 FB Neck ROM: Full    Dental no notable dental hx. (+) Poor Dentition, Dental Advisory Given   Pulmonary neg pulmonary ROS,    Pulmonary exam normal        Cardiovascular hypertension, Normal cardiovascular exam     Neuro/Psych PSYCHIATRIC DISORDERS Anxiety    GI/Hepatic Neg liver ROS, GERD  Medicated,  Endo/Other  diabetesHypothyroidism   Renal/GU ESRF and CRFRenal disease     Musculoskeletal   Abdominal   Peds  Hematology   Anesthesia Other Findings   Reproductive/Obstetrics                             Anesthesia Physical Anesthesia Plan  ASA: III  Anesthesia Plan: General   Post-op Pain Management:    Induction: Intravenous  PONV Risk Score and Plan: 2 and Ondansetron and Dexamethasone  Airway Management Planned: LMA  Additional Equipment:   Intra-op Plan:   Post-operative Plan: Extubation in OR  Informed Consent: I have reviewed the patients History and Physical, chart, labs and discussed the procedure including the risks, benefits and alternatives for the proposed anesthesia with the patient or authorized representative who has indicated his/her understanding and acceptance.   Dental advisory given  Plan Discussed with: CRNA and Anesthesiologist  Anesthesia Plan Comments:         Anesthesia Quick Evaluation

## 2017-12-10 NOTE — Progress Notes (Signed)
Per Dr. Scot Dock, to place IV in left arm

## 2017-12-10 NOTE — Anesthesia Procedure Notes (Signed)
Procedure Name: LMA Insertion Date/Time: 12/10/2017 11:04 AM Performed by: Wilburn Cornelia, CRNA Pre-anesthesia Checklist: Patient identified, Emergency Drugs available, Suction available, Patient being monitored and Timeout performed Patient Re-evaluated:Patient Re-evaluated prior to induction Oxygen Delivery Method: Circle system utilized Preoxygenation: Pre-oxygenation with 100% oxygen Induction Type: IV induction Ventilation: Mask ventilation without difficulty LMA: LMA inserted LMA Size: 5.0 Number of attempts: 1 Placement Confirmation: CO2 detector,  breath sounds checked- equal and bilateral and positive ETCO2 Tube secured with: Tape Dental Injury: Teeth and Oropharynx as per pre-operative assessment

## 2017-12-10 NOTE — Transfer of Care (Signed)
Immediate Anesthesia Transfer of Care Note  Patient: Zachary [name]  Procedure(s) Performed: THROMBECTOMY REVISION RIGHT ARM  ARTERIOVENOUS FISTULA (Right Arm Upper) PATCH ANGIOPLASTY (Right Arm Upper)  Patient Location: PACU  Anesthesia Type:General  Level of Consciousness: awake, alert  and oriented  Airway & Oxygen Therapy: Patient Spontanous Breathing and Patient connected to nasal cannula oxygen  Post-op Assessment: Report given to RN and Post -op Vital signs reviewed and stable  Post vital signs: Reviewed and stable  Last Vitals:  Vitals:   12/10/17 0808 12/10/17 1239  BP: 131/75   Pulse: 89   Resp: 18   Temp: 36.4 C 36.7 C  SpO2: 100%     Last Pain:  Vitals:   12/10/17 0808  TempSrc: Oral      Patients Stated Pain Goal: 3 (19/01/22 2411)  Complications: No apparent anesthesia complications

## 2017-12-10 NOTE — Interval H&P Note (Signed)
History and Physical Interval Note:  12/10/2017 8:40 AM  Zachary [name]  has presented today for surgery, with the diagnosis of End stage renal disease  The various methods of treatment have been discussed with the patient and family. After consideration of risks, benefits and other options for treatment, the patient has consented to  Procedure(s): THROMBECTOMY ARTERIOVENOUS FISTULA ARM (Right) INSERTION OF ARTERIOVENOUS (AV) GORE-TEX GRAFT ARM (Left) INSERTION OF DIALYSIS CATHETER (N/A) as a surgical intervention .  [name] history has been reviewed, patient examined, no change in status, stable for surgery.  I have reviewed the patient's chart and labs.  Questions were answered to the patient's satisfaction.     [name]

## 2017-12-11 ENCOUNTER — Encounter (HOSPITAL_COMMUNITY): Payer: Self-pay | Admitting: Vascular Surgery

## 2017-12-11 DIAGNOSIS — D509 Iron deficiency anemia, unspecified: Secondary | ICD-10-CM | POA: Diagnosis not present

## 2017-12-11 DIAGNOSIS — Z992 Dependence on renal dialysis: Secondary | ICD-10-CM | POA: Diagnosis not present

## 2017-12-11 DIAGNOSIS — R279 Unspecified lack of coordination: Secondary | ICD-10-CM | POA: Diagnosis not present

## 2017-12-11 DIAGNOSIS — N186 End stage renal disease: Secondary | ICD-10-CM | POA: Diagnosis not present

## 2017-12-11 DIAGNOSIS — M6281 Muscle weakness (generalized): Secondary | ICD-10-CM | POA: Diagnosis not present

## 2017-12-11 DIAGNOSIS — D631 Anemia in chronic kidney disease: Secondary | ICD-10-CM | POA: Diagnosis not present

## 2017-12-11 DIAGNOSIS — D500 Iron deficiency anemia secondary to blood loss (chronic): Secondary | ICD-10-CM | POA: Diagnosis not present

## 2017-12-11 DIAGNOSIS — N2581 Secondary hyperparathyroidism of renal origin: Secondary | ICD-10-CM | POA: Diagnosis not present

## 2017-12-11 DIAGNOSIS — Z1159 Encounter for screening for other viral diseases: Secondary | ICD-10-CM | POA: Diagnosis not present

## 2017-12-12 DIAGNOSIS — Z992 Dependence on renal dialysis: Secondary | ICD-10-CM | POA: Diagnosis not present

## 2017-12-12 DIAGNOSIS — N186 End stage renal disease: Secondary | ICD-10-CM | POA: Diagnosis not present

## 2017-12-12 DIAGNOSIS — N2581 Secondary hyperparathyroidism of renal origin: Secondary | ICD-10-CM | POA: Diagnosis not present

## 2017-12-12 DIAGNOSIS — D631 Anemia in chronic kidney disease: Secondary | ICD-10-CM | POA: Diagnosis not present

## 2017-12-12 DIAGNOSIS — D509 Iron deficiency anemia, unspecified: Secondary | ICD-10-CM | POA: Diagnosis not present

## 2017-12-15 DIAGNOSIS — D509 Iron deficiency anemia, unspecified: Secondary | ICD-10-CM | POA: Diagnosis not present

## 2017-12-15 DIAGNOSIS — Z992 Dependence on renal dialysis: Secondary | ICD-10-CM | POA: Diagnosis not present

## 2017-12-15 DIAGNOSIS — D631 Anemia in chronic kidney disease: Secondary | ICD-10-CM | POA: Diagnosis not present

## 2017-12-15 DIAGNOSIS — N186 End stage renal disease: Secondary | ICD-10-CM | POA: Diagnosis not present

## 2017-12-15 DIAGNOSIS — N2581 Secondary hyperparathyroidism of renal origin: Secondary | ICD-10-CM | POA: Diagnosis not present

## 2017-12-16 DIAGNOSIS — M6281 Muscle weakness (generalized): Secondary | ICD-10-CM | POA: Diagnosis not present

## 2017-12-16 DIAGNOSIS — D500 Iron deficiency anemia secondary to blood loss (chronic): Secondary | ICD-10-CM | POA: Diagnosis not present

## 2017-12-16 DIAGNOSIS — R279 Unspecified lack of coordination: Secondary | ICD-10-CM | POA: Diagnosis not present

## 2017-12-17 DIAGNOSIS — R279 Unspecified lack of coordination: Secondary | ICD-10-CM | POA: Diagnosis not present

## 2017-12-17 DIAGNOSIS — D500 Iron deficiency anemia secondary to blood loss (chronic): Secondary | ICD-10-CM | POA: Diagnosis not present

## 2017-12-17 DIAGNOSIS — D631 Anemia in chronic kidney disease: Secondary | ICD-10-CM | POA: Diagnosis not present

## 2017-12-17 DIAGNOSIS — D509 Iron deficiency anemia, unspecified: Secondary | ICD-10-CM | POA: Diagnosis not present

## 2017-12-17 DIAGNOSIS — Z992 Dependence on renal dialysis: Secondary | ICD-10-CM | POA: Diagnosis not present

## 2017-12-17 DIAGNOSIS — N2581 Secondary hyperparathyroidism of renal origin: Secondary | ICD-10-CM | POA: Diagnosis not present

## 2017-12-17 DIAGNOSIS — M6281 Muscle weakness (generalized): Secondary | ICD-10-CM | POA: Diagnosis not present

## 2017-12-17 DIAGNOSIS — N186 End stage renal disease: Secondary | ICD-10-CM | POA: Diagnosis not present

## 2017-12-18 DIAGNOSIS — D500 Iron deficiency anemia secondary to blood loss (chronic): Secondary | ICD-10-CM | POA: Diagnosis not present

## 2017-12-18 DIAGNOSIS — M6281 Muscle weakness (generalized): Secondary | ICD-10-CM | POA: Diagnosis not present

## 2017-12-18 DIAGNOSIS — R279 Unspecified lack of coordination: Secondary | ICD-10-CM | POA: Diagnosis not present

## 2017-12-19 DIAGNOSIS — N2581 Secondary hyperparathyroidism of renal origin: Secondary | ICD-10-CM | POA: Diagnosis not present

## 2017-12-19 DIAGNOSIS — N186 End stage renal disease: Secondary | ICD-10-CM | POA: Diagnosis not present

## 2017-12-19 DIAGNOSIS — Z992 Dependence on renal dialysis: Secondary | ICD-10-CM | POA: Diagnosis not present

## 2017-12-19 DIAGNOSIS — D631 Anemia in chronic kidney disease: Secondary | ICD-10-CM | POA: Diagnosis not present

## 2017-12-19 DIAGNOSIS — D509 Iron deficiency anemia, unspecified: Secondary | ICD-10-CM | POA: Diagnosis not present

## 2017-12-21 DIAGNOSIS — R279 Unspecified lack of coordination: Secondary | ICD-10-CM | POA: Diagnosis not present

## 2017-12-21 DIAGNOSIS — M6281 Muscle weakness (generalized): Secondary | ICD-10-CM | POA: Diagnosis not present

## 2017-12-21 DIAGNOSIS — D500 Iron deficiency anemia secondary to blood loss (chronic): Secondary | ICD-10-CM | POA: Diagnosis not present

## 2017-12-22 DIAGNOSIS — N2581 Secondary hyperparathyroidism of renal origin: Secondary | ICD-10-CM | POA: Diagnosis not present

## 2017-12-22 DIAGNOSIS — D500 Iron deficiency anemia secondary to blood loss (chronic): Secondary | ICD-10-CM | POA: Diagnosis not present

## 2017-12-22 DIAGNOSIS — Z992 Dependence on renal dialysis: Secondary | ICD-10-CM | POA: Diagnosis not present

## 2017-12-22 DIAGNOSIS — D631 Anemia in chronic kidney disease: Secondary | ICD-10-CM | POA: Diagnosis not present

## 2017-12-22 DIAGNOSIS — D509 Iron deficiency anemia, unspecified: Secondary | ICD-10-CM | POA: Diagnosis not present

## 2017-12-22 DIAGNOSIS — M6281 Muscle weakness (generalized): Secondary | ICD-10-CM | POA: Diagnosis not present

## 2017-12-22 DIAGNOSIS — R279 Unspecified lack of coordination: Secondary | ICD-10-CM | POA: Diagnosis not present

## 2017-12-22 DIAGNOSIS — N186 End stage renal disease: Secondary | ICD-10-CM | POA: Diagnosis not present

## 2017-12-24 DIAGNOSIS — D631 Anemia in chronic kidney disease: Secondary | ICD-10-CM | POA: Diagnosis not present

## 2017-12-24 DIAGNOSIS — Z992 Dependence on renal dialysis: Secondary | ICD-10-CM | POA: Diagnosis not present

## 2017-12-24 DIAGNOSIS — M6281 Muscle weakness (generalized): Secondary | ICD-10-CM | POA: Diagnosis not present

## 2017-12-24 DIAGNOSIS — D509 Iron deficiency anemia, unspecified: Secondary | ICD-10-CM | POA: Diagnosis not present

## 2017-12-24 DIAGNOSIS — D500 Iron deficiency anemia secondary to blood loss (chronic): Secondary | ICD-10-CM | POA: Diagnosis not present

## 2017-12-24 DIAGNOSIS — N186 End stage renal disease: Secondary | ICD-10-CM | POA: Diagnosis not present

## 2017-12-24 DIAGNOSIS — N2581 Secondary hyperparathyroidism of renal origin: Secondary | ICD-10-CM | POA: Diagnosis not present

## 2017-12-24 DIAGNOSIS — R279 Unspecified lack of coordination: Secondary | ICD-10-CM | POA: Diagnosis not present

## 2017-12-26 DIAGNOSIS — D631 Anemia in chronic kidney disease: Secondary | ICD-10-CM | POA: Diagnosis not present

## 2017-12-26 DIAGNOSIS — Z23 Encounter for immunization: Secondary | ICD-10-CM | POA: Diagnosis not present

## 2017-12-26 DIAGNOSIS — N186 End stage renal disease: Secondary | ICD-10-CM | POA: Diagnosis not present

## 2017-12-26 DIAGNOSIS — Z992 Dependence on renal dialysis: Secondary | ICD-10-CM | POA: Diagnosis not present

## 2017-12-26 DIAGNOSIS — D509 Iron deficiency anemia, unspecified: Secondary | ICD-10-CM | POA: Diagnosis not present

## 2017-12-26 DIAGNOSIS — N2581 Secondary hyperparathyroidism of renal origin: Secondary | ICD-10-CM | POA: Diagnosis not present

## 2017-12-29 DIAGNOSIS — D509 Iron deficiency anemia, unspecified: Secondary | ICD-10-CM | POA: Diagnosis not present

## 2017-12-29 DIAGNOSIS — N186 End stage renal disease: Secondary | ICD-10-CM | POA: Diagnosis not present

## 2017-12-29 DIAGNOSIS — Z23 Encounter for immunization: Secondary | ICD-10-CM | POA: Diagnosis not present

## 2017-12-29 DIAGNOSIS — N2581 Secondary hyperparathyroidism of renal origin: Secondary | ICD-10-CM | POA: Diagnosis not present

## 2017-12-29 DIAGNOSIS — D631 Anemia in chronic kidney disease: Secondary | ICD-10-CM | POA: Diagnosis not present

## 2017-12-29 DIAGNOSIS — Z992 Dependence on renal dialysis: Secondary | ICD-10-CM | POA: Diagnosis not present

## 2017-12-30 DIAGNOSIS — M6281 Muscle weakness (generalized): Secondary | ICD-10-CM | POA: Diagnosis not present

## 2017-12-30 DIAGNOSIS — R279 Unspecified lack of coordination: Secondary | ICD-10-CM | POA: Diagnosis not present

## 2017-12-30 DIAGNOSIS — D500 Iron deficiency anemia secondary to blood loss (chronic): Secondary | ICD-10-CM | POA: Diagnosis not present

## 2017-12-31 DIAGNOSIS — M6281 Muscle weakness (generalized): Secondary | ICD-10-CM | POA: Diagnosis not present

## 2017-12-31 DIAGNOSIS — R279 Unspecified lack of coordination: Secondary | ICD-10-CM | POA: Diagnosis not present

## 2017-12-31 DIAGNOSIS — Z992 Dependence on renal dialysis: Secondary | ICD-10-CM | POA: Diagnosis not present

## 2017-12-31 DIAGNOSIS — D509 Iron deficiency anemia, unspecified: Secondary | ICD-10-CM | POA: Diagnosis not present

## 2017-12-31 DIAGNOSIS — D500 Iron deficiency anemia secondary to blood loss (chronic): Secondary | ICD-10-CM | POA: Diagnosis not present

## 2017-12-31 DIAGNOSIS — N2581 Secondary hyperparathyroidism of renal origin: Secondary | ICD-10-CM | POA: Diagnosis not present

## 2017-12-31 DIAGNOSIS — D631 Anemia in chronic kidney disease: Secondary | ICD-10-CM | POA: Diagnosis not present

## 2017-12-31 DIAGNOSIS — Z23 Encounter for immunization: Secondary | ICD-10-CM | POA: Diagnosis not present

## 2017-12-31 DIAGNOSIS — N186 End stage renal disease: Secondary | ICD-10-CM | POA: Diagnosis not present

## 2018-01-01 DIAGNOSIS — R279 Unspecified lack of coordination: Secondary | ICD-10-CM | POA: Diagnosis not present

## 2018-01-01 DIAGNOSIS — M6281 Muscle weakness (generalized): Secondary | ICD-10-CM | POA: Diagnosis not present

## 2018-01-01 DIAGNOSIS — D500 Iron deficiency anemia secondary to blood loss (chronic): Secondary | ICD-10-CM | POA: Diagnosis not present

## 2018-01-02 DIAGNOSIS — N186 End stage renal disease: Secondary | ICD-10-CM | POA: Diagnosis not present

## 2018-01-02 DIAGNOSIS — D509 Iron deficiency anemia, unspecified: Secondary | ICD-10-CM | POA: Diagnosis not present

## 2018-01-02 DIAGNOSIS — D631 Anemia in chronic kidney disease: Secondary | ICD-10-CM | POA: Diagnosis not present

## 2018-01-02 DIAGNOSIS — N2581 Secondary hyperparathyroidism of renal origin: Secondary | ICD-10-CM | POA: Diagnosis not present

## 2018-01-02 DIAGNOSIS — Z992 Dependence on renal dialysis: Secondary | ICD-10-CM | POA: Diagnosis not present

## 2018-01-02 DIAGNOSIS — Z23 Encounter for immunization: Secondary | ICD-10-CM | POA: Diagnosis not present

## 2018-01-04 DIAGNOSIS — D500 Iron deficiency anemia secondary to blood loss (chronic): Secondary | ICD-10-CM | POA: Diagnosis not present

## 2018-01-04 DIAGNOSIS — M6281 Muscle weakness (generalized): Secondary | ICD-10-CM | POA: Diagnosis not present

## 2018-01-04 DIAGNOSIS — R279 Unspecified lack of coordination: Secondary | ICD-10-CM | POA: Diagnosis not present

## 2018-01-05 DIAGNOSIS — N186 End stage renal disease: Secondary | ICD-10-CM | POA: Diagnosis not present

## 2018-01-05 DIAGNOSIS — Z992 Dependence on renal dialysis: Secondary | ICD-10-CM | POA: Diagnosis not present

## 2018-01-05 DIAGNOSIS — D631 Anemia in chronic kidney disease: Secondary | ICD-10-CM | POA: Diagnosis not present

## 2018-01-05 DIAGNOSIS — R279 Unspecified lack of coordination: Secondary | ICD-10-CM | POA: Diagnosis not present

## 2018-01-05 DIAGNOSIS — D500 Iron deficiency anemia secondary to blood loss (chronic): Secondary | ICD-10-CM | POA: Diagnosis not present

## 2018-01-05 DIAGNOSIS — Z23 Encounter for immunization: Secondary | ICD-10-CM | POA: Diagnosis not present

## 2018-01-05 DIAGNOSIS — N2581 Secondary hyperparathyroidism of renal origin: Secondary | ICD-10-CM | POA: Diagnosis not present

## 2018-01-05 DIAGNOSIS — M6281 Muscle weakness (generalized): Secondary | ICD-10-CM | POA: Diagnosis not present

## 2018-01-05 DIAGNOSIS — D509 Iron deficiency anemia, unspecified: Secondary | ICD-10-CM | POA: Diagnosis not present

## 2018-01-06 DIAGNOSIS — D500 Iron deficiency anemia secondary to blood loss (chronic): Secondary | ICD-10-CM | POA: Diagnosis not present

## 2018-01-06 DIAGNOSIS — R279 Unspecified lack of coordination: Secondary | ICD-10-CM | POA: Diagnosis not present

## 2018-01-06 DIAGNOSIS — M6281 Muscle weakness (generalized): Secondary | ICD-10-CM | POA: Diagnosis not present

## 2018-01-07 DIAGNOSIS — R279 Unspecified lack of coordination: Secondary | ICD-10-CM | POA: Diagnosis not present

## 2018-01-07 DIAGNOSIS — N2581 Secondary hyperparathyroidism of renal origin: Secondary | ICD-10-CM | POA: Diagnosis not present

## 2018-01-07 DIAGNOSIS — Z992 Dependence on renal dialysis: Secondary | ICD-10-CM | POA: Diagnosis not present

## 2018-01-07 DIAGNOSIS — M6281 Muscle weakness (generalized): Secondary | ICD-10-CM | POA: Diagnosis not present

## 2018-01-07 DIAGNOSIS — N186 End stage renal disease: Secondary | ICD-10-CM | POA: Diagnosis not present

## 2018-01-07 DIAGNOSIS — D509 Iron deficiency anemia, unspecified: Secondary | ICD-10-CM | POA: Diagnosis not present

## 2018-01-07 DIAGNOSIS — Z23 Encounter for immunization: Secondary | ICD-10-CM | POA: Diagnosis not present

## 2018-01-07 DIAGNOSIS — D500 Iron deficiency anemia secondary to blood loss (chronic): Secondary | ICD-10-CM | POA: Diagnosis not present

## 2018-01-07 DIAGNOSIS — D631 Anemia in chronic kidney disease: Secondary | ICD-10-CM | POA: Diagnosis not present

## 2018-01-08 DIAGNOSIS — F4322 Adjustment disorder with anxiety: Secondary | ICD-10-CM | POA: Diagnosis not present

## 2018-01-08 DIAGNOSIS — R279 Unspecified lack of coordination: Secondary | ICD-10-CM | POA: Diagnosis not present

## 2018-01-08 DIAGNOSIS — D500 Iron deficiency anemia secondary to blood loss (chronic): Secondary | ICD-10-CM | POA: Diagnosis not present

## 2018-01-08 DIAGNOSIS — F70 Mild intellectual disabilities: Secondary | ICD-10-CM | POA: Diagnosis not present

## 2018-01-08 DIAGNOSIS — M6281 Muscle weakness (generalized): Secondary | ICD-10-CM | POA: Diagnosis not present

## 2018-01-09 DIAGNOSIS — N2581 Secondary hyperparathyroidism of renal origin: Secondary | ICD-10-CM | POA: Diagnosis not present

## 2018-01-09 DIAGNOSIS — Z23 Encounter for immunization: Secondary | ICD-10-CM | POA: Diagnosis not present

## 2018-01-09 DIAGNOSIS — D631 Anemia in chronic kidney disease: Secondary | ICD-10-CM | POA: Diagnosis not present

## 2018-01-09 DIAGNOSIS — N186 End stage renal disease: Secondary | ICD-10-CM | POA: Diagnosis not present

## 2018-01-09 DIAGNOSIS — D509 Iron deficiency anemia, unspecified: Secondary | ICD-10-CM | POA: Diagnosis not present

## 2018-01-09 DIAGNOSIS — Z992 Dependence on renal dialysis: Secondary | ICD-10-CM | POA: Diagnosis not present

## 2018-01-11 DIAGNOSIS — M6281 Muscle weakness (generalized): Secondary | ICD-10-CM | POA: Diagnosis not present

## 2018-01-11 DIAGNOSIS — D500 Iron deficiency anemia secondary to blood loss (chronic): Secondary | ICD-10-CM | POA: Diagnosis not present

## 2018-01-11 DIAGNOSIS — R279 Unspecified lack of coordination: Secondary | ICD-10-CM | POA: Diagnosis not present

## 2018-01-12 DIAGNOSIS — D509 Iron deficiency anemia, unspecified: Secondary | ICD-10-CM | POA: Diagnosis not present

## 2018-01-12 DIAGNOSIS — R279 Unspecified lack of coordination: Secondary | ICD-10-CM | POA: Diagnosis not present

## 2018-01-12 DIAGNOSIS — N2581 Secondary hyperparathyroidism of renal origin: Secondary | ICD-10-CM | POA: Diagnosis not present

## 2018-01-12 DIAGNOSIS — Z992 Dependence on renal dialysis: Secondary | ICD-10-CM | POA: Diagnosis not present

## 2018-01-12 DIAGNOSIS — Z23 Encounter for immunization: Secondary | ICD-10-CM | POA: Diagnosis not present

## 2018-01-12 DIAGNOSIS — M6281 Muscle weakness (generalized): Secondary | ICD-10-CM | POA: Diagnosis not present

## 2018-01-12 DIAGNOSIS — N186 End stage renal disease: Secondary | ICD-10-CM | POA: Diagnosis not present

## 2018-01-12 DIAGNOSIS — D500 Iron deficiency anemia secondary to blood loss (chronic): Secondary | ICD-10-CM | POA: Diagnosis not present

## 2018-01-12 DIAGNOSIS — D631 Anemia in chronic kidney disease: Secondary | ICD-10-CM | POA: Diagnosis not present

## 2018-01-13 DIAGNOSIS — M6281 Muscle weakness (generalized): Secondary | ICD-10-CM | POA: Diagnosis not present

## 2018-01-13 DIAGNOSIS — R279 Unspecified lack of coordination: Secondary | ICD-10-CM | POA: Diagnosis not present

## 2018-01-13 DIAGNOSIS — D500 Iron deficiency anemia secondary to blood loss (chronic): Secondary | ICD-10-CM | POA: Diagnosis not present

## 2018-01-14 DIAGNOSIS — N2581 Secondary hyperparathyroidism of renal origin: Secondary | ICD-10-CM | POA: Diagnosis not present

## 2018-01-14 DIAGNOSIS — D509 Iron deficiency anemia, unspecified: Secondary | ICD-10-CM | POA: Diagnosis not present

## 2018-01-14 DIAGNOSIS — D631 Anemia in chronic kidney disease: Secondary | ICD-10-CM | POA: Diagnosis not present

## 2018-01-14 DIAGNOSIS — Z23 Encounter for immunization: Secondary | ICD-10-CM | POA: Diagnosis not present

## 2018-01-14 DIAGNOSIS — R279 Unspecified lack of coordination: Secondary | ICD-10-CM | POA: Diagnosis not present

## 2018-01-14 DIAGNOSIS — Z992 Dependence on renal dialysis: Secondary | ICD-10-CM | POA: Diagnosis not present

## 2018-01-14 DIAGNOSIS — N186 End stage renal disease: Secondary | ICD-10-CM | POA: Diagnosis not present

## 2018-01-14 DIAGNOSIS — M6281 Muscle weakness (generalized): Secondary | ICD-10-CM | POA: Diagnosis not present

## 2018-01-14 DIAGNOSIS — D500 Iron deficiency anemia secondary to blood loss (chronic): Secondary | ICD-10-CM | POA: Diagnosis not present

## 2018-01-15 DIAGNOSIS — R279 Unspecified lack of coordination: Secondary | ICD-10-CM | POA: Diagnosis not present

## 2018-01-15 DIAGNOSIS — M6281 Muscle weakness (generalized): Secondary | ICD-10-CM | POA: Diagnosis not present

## 2018-01-15 DIAGNOSIS — D500 Iron deficiency anemia secondary to blood loss (chronic): Secondary | ICD-10-CM | POA: Diagnosis not present

## 2018-01-16 DIAGNOSIS — D509 Iron deficiency anemia, unspecified: Secondary | ICD-10-CM | POA: Diagnosis not present

## 2018-01-16 DIAGNOSIS — Z992 Dependence on renal dialysis: Secondary | ICD-10-CM | POA: Diagnosis not present

## 2018-01-16 DIAGNOSIS — D631 Anemia in chronic kidney disease: Secondary | ICD-10-CM | POA: Diagnosis not present

## 2018-01-16 DIAGNOSIS — N186 End stage renal disease: Secondary | ICD-10-CM | POA: Diagnosis not present

## 2018-01-16 DIAGNOSIS — N2581 Secondary hyperparathyroidism of renal origin: Secondary | ICD-10-CM | POA: Diagnosis not present

## 2018-01-16 DIAGNOSIS — Z23 Encounter for immunization: Secondary | ICD-10-CM | POA: Diagnosis not present

## 2018-01-18 ENCOUNTER — Encounter: Payer: Self-pay | Admitting: Internal Medicine

## 2018-01-18 ENCOUNTER — Non-Acute Institutional Stay (SKILLED_NURSING_FACILITY): Payer: Medicare Other | Admitting: Internal Medicine

## 2018-01-18 DIAGNOSIS — E1121 Type 2 diabetes mellitus with diabetic nephropathy: Secondary | ICD-10-CM

## 2018-01-18 DIAGNOSIS — M6281 Muscle weakness (generalized): Secondary | ICD-10-CM | POA: Diagnosis not present

## 2018-01-18 DIAGNOSIS — R279 Unspecified lack of coordination: Secondary | ICD-10-CM | POA: Diagnosis not present

## 2018-01-18 DIAGNOSIS — E7849 Other hyperlipidemia: Secondary | ICD-10-CM

## 2018-01-18 DIAGNOSIS — D500 Iron deficiency anemia secondary to blood loss (chronic): Secondary | ICD-10-CM | POA: Diagnosis not present

## 2018-01-18 DIAGNOSIS — D509 Iron deficiency anemia, unspecified: Secondary | ICD-10-CM

## 2018-01-18 DIAGNOSIS — N186 End stage renal disease: Secondary | ICD-10-CM | POA: Diagnosis not present

## 2018-01-18 NOTE — Progress Notes (Signed)
Location:   [facility] Room Number: 112/D Place of Service:  SNF (31) Provider:  Geralyn Flash, MD  Patient Care Team: [name], MD as PCP - General (Internal Medicine) Cassandria Anger, MD as Consulting Physician (Endocrinology) Fran Lowes, MD as Consulting Physician (Nephrology) Gala Romney Cristopher Estimable, MD as Consulting Physician (Gastroenterology)  Extended Emergency Contact Information Primary Emergency Contact: Betsey Amen Address: DSS  John[place]te [place] Phone: [phone] Work Phone: [phone] Relation: Legal Guardian Secondary Emergency Contact: Caputo,Richard Address: [address]          [place], [place] [address] [place] Phone: [phone] Relation: Brother  Code Status:  Full Code Goals of care: Advanced Directive information Advanced Directives 01/18/2018  Does Patient Have a Medical Advance Directive? Yes  Type of Advance Directive (No Data)  Does patient want to make changes to medical advance directive? No - Patient declined  Copy of [place] in Chart? No - copy requested  Would patient like information on creating a medical advance directive? No - Patient declined  Pre-existing out of facility DNR order (yellow form or pink MOST form) -     Chief Complaint  Patient presents with  . Medical Management of Chronic Issues    Routine Visit,     HPI:  Pt is a 72 y.o. male seen today for medical management of chronic diseases.    Patient has H/O ESRD on dialysis TTS, Type 2 Diabetes, Hypothyroidism, anemia , Mild Mental delay.H/O Right Distal Fracture S/P ORIF, Anemia, GI bleed s/p Colectomy in 05/04/17 and DVT  Patient doing well in Facility. He has gained more weight and is now 208 lbs from 182 Lbs in my last visit. 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 He did wanted to know if he can go home. As he is walking with the  [facility] and is wondering if his sister can take him with them. Patient was in [place] before coming to this facility. No New Nursing issues.  Past Medical History:  Diagnosis Date  . Abnormal CT scan, kidney 10/06/2011  . Acute pyelonephritis 10/07/2011  . Anemia    normocytic  . Anxiety    mental retardation  . Bladder wall thickening 10/06/2011  . BPH (benign prostatic hypertrophy)   . Diabetes mellitus   . Dialysis patient [facility])    Tuesday, Thursday and Saturday,   . Edema     history of lower extremity edema  . GERD (gastroesophageal reflux disease)   . Heme positive stool   . Hydronephrosis   . Hyperkalemia   . Hyperlipidemia   . Hypernatremia   . Hypertension   . Hypothyroidism   . Impaired speech   . 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 MR (mental retardation)   . Muscle weakness   . Obstructive uropathy   . 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 Poor historian poor historian  . Protein calorie malnutrition ([place])   . Pyelonephritis   . Renal failure (ARF), acute on chronic (HCC)   . Renal insufficiency    chronic history  . Sepsis ([place])   . Smoking   . Uremia   . Urinary retention   . UTI (lower urinary tract infection) 10/06/2011   Past Surgical History:  Procedure Laterality Date  . AV FISTULA PLACEMENT Left 07/06/2015   Procedure:  INSERTION LEFT ARM ARTERIOVENOUS GORTEX GRAFT;  Surgeon: Angelia Mould, MD;  [facility]  OR;  Service: Vascular;  Laterality: Left;  . AV FISTULA PLACEMENT Right 02/26/2016   Procedure: ARTERIOVENOUS (AV) FISTULA CREATION ;  Surgeon: Angelia Mould, MD;  Location: [facility];  Service: Vascular;  Laterality: Right;  . [place] REMOVAL Left 10/09/2015   Procedure: REMOVAL OF ARTERIOVENOUS GORETEX GRAFT ([place]) Evacuation of Lymphocele, Vein Patch angioplasty of brachial artery.;  Surgeon: Angelia Mould, MD;  Location: [facility];  Service: Vascular;  Laterality: Left;  . BASCILIC VEIN TRANSPOSITION Right 02/26/2016    Procedure: Right BASCILIC VEIN TRANSPOSITION;  Surgeon: Angelia Mould, MD;  Location: [place];  Service: Vascular;  Laterality: Right;  . CIRCUMCISION N/A 01/04/2014   Procedure: CIRCUMCISION ADULT (procedure #1);  Surgeon: Marissa Nestle, MD;  Location: [facility];  Service: Urology;  Laterality: N/A;  . COLECTOMY N/A 05/04/2017   Procedure: TOTAL COLECTOMY;  Surgeon: Aviva Signs, MD;  Location: [facility];  Service: General;  Laterality: N/A;  . COLONOSCOPY N/A 04/27/2017   Procedure: COLONOSCOPY;  Surgeon: Daneil Dolin, MD;  [facility];  Service: Endoscopy;  Laterality: N/A;  245  . CYSTOSCOPY W/ RETROGRADES Bilateral 06/29/2015   Procedure: CYSTOSCOPY, DILATION OF URETHRAL STRICTURE WITH BILATERAL RETROGRADE PYELOGRAM,SUPRAPUBIC TUBE CHANGE;  Surgeon: Festus Aloe, MD;  Location: [facility];  Service: Urology;  Laterality: Bilateral;  . CYSTOSCOPY WITH URETHRAL DILATATION N/A 12/29/2013   Procedure: CYSTOSCOPY WITH URETHRAL DILATATION;  Surgeon: Marissa Nestle, MD;  Location[facility];  Service: Urology;  Laterality: N/A;  . ESOPHAGOGASTRODUODENOSCOPY N/A 04/27/2017   Procedure: ESOPHAGOGASTRODUODENOSCOPY (EGD);  Surgeon: Daneil Dolin, MD;  [facility];  Service: Endoscopy;  Laterality: N/A;  . ORIF FEMUR FRACTURE Right 11/22/2016   Procedure: OPEN REDUCTION INTERNAL FIXATION (ORIF) DISTAL FEMUR FRACTURE;  Surgeon: Rod Can, MD;  Location: [facility];  Service: Orthopedics;  Laterality: Right;  . PATCH ANGIOPLASTY Right 12/10/2017   Procedure: PATCH ANGIOPLASTY;  Surgeon: Angelia Mould, MD;  Location: [facility] OR;  Service: Vascular;  Laterality: Right;  . PERIPHERAL VASCULAR CATHETERIZATION N/A 10/08/2015   Procedure: A/V Shuntogram;  Surgeon: Angelia Mould, MD;  Location: [place] [facility] CV LAB;  Service: Cardiovascular;  Laterality: N/A;  . THROMBECTOMY W/ EMBOLECTOMY Right 12/10/2017   Procedure: THROMBECTOMY REVISION RIGHT ARM  ARTERIOVENOUS FISTULA;   Surgeon: Angelia Mould, MD;  Location: [place];  Service: Vascular;  Laterality: Right;  . TRANSURETHRAL RESECTION OF PROSTATE N/A 01/04/2014   Procedure: TRANSURETHRAL RESECTION OF THE PROSTATE (TURP) (procedure #2);  Surgeon: Marissa Nestle, MD;  [facility];  Service: Urology;  Laterality: N/A;    No Known Allergies  Outpatient Encounter Medications as of 01/18/2018  Medication Sig  . atorvastatin (LIPITOR) 40 MG tablet Take 40 mg by mouth every evening.  Marland Kitchen levothyroxine (SYNTHROID, LEVOTHROID) 75 MCG tablet Take 75 mcg by mouth daily before breakfast.   . linagliptin (TRADJENTA) 5 MG TABS tablet Take 5 mg by mouth every evening.   . midodrine (PROAMATINE) 10 MG tablet Take 10 mg by mouth. Send medication with patient on Tue.Thu., Sat.,to dialysis do not give at [facility] give to take with him  . Multiple Vitamin (MULTIVITAMIN WITH MINERALS) TABS tablet Take 1 tablet by mouth every evening.   . Neo-Bacit-Poly-Lidocaine (FIRST AID PLUS LIDOCAINE EX) Apply 1 application topically Every Tuesday,Thursday,and Saturday with dialysis. Prior to dialysis  . Nutritional Supplements (NEPRO/CARBSTEADY PO) Take 1 Can by mouth 2 (two) times daily.  Marland Kitchen omeprazole (PRILOSEC) 40 MG capsule Take 40 mg by  mouth at bedtime.   . sevelamer carbonate (RENVELA) 800 MG tablet Take 1,600 mg by mouth daily.   . sevelamer carbonate (RENVELA) 800 MG tablet Take 2,400 mg by mouth 3 (three) times daily with meals.  . tamsulosin (FLOMAX) 0.4 MG CAPS capsule Take 0.4 mg by mouth every evening. Give 30 minutes after a meal  . torsemide (DEMADEX) 10 MG tablet Take 5 tablets (50 mg total) by mouth daily.  . [DISCONTINUED] Amino Acids-Protein Hydrolys (FEEDING SUPPLEMENT, PRO-STAT 64,) LIQD Take 30 mLs by mouth 2 (two) times daily between meals.   No facility-administered encounter medications on file as of 01/18/2018.      Review of Systems  Review of Systems  Constitutional: Negative for activity change,  appetite change, chills, diaphoresis, fatigue and fever.  HENT: Negative for mouth sores, postnasal drip, rhinorrhea, sinus pain and sore throat.   Respiratory: Negative for apnea, cough, chest tightness, shortness of breath and wheezing.   Cardiovascular: Negative for chest pain, palpitations and leg swelling.  Gastrointestinal: Negative for abdominal distention, abdominal pain, constipation, diarrhea, nausea and vomiting.  Genitourinary: Negative for dysuria and frequency.  Musculoskeletal: Negative for arthralgias, joint swelling and myalgias.  Skin: Negative for rash.  Neurological: Negative for dizziness, syncope, weakness, light-headedness and numbness.  Psychiatric/Behavioral: Negative for behavioral problems, confusion and sleep disturbance.     Immunization History  Administered Date(s) Administered  . Influenza,inj,Quad PF,6+ Mos 12/26/2013, 07/09/2015  . Influenza-Unspecified 07/31/2017  . PPD Test 03/07/2015  . Pneumococcal Conjugate-13 07/31/2017  . Pneumococcal Polysaccharide-23 03/04/2015  . Tdap 08/22/2017   Pertinent  Health Maintenance Due  Topic Date Due  . OPHTHALMOLOGY EXAM  02/18/2018 (Originally 08/23/1956)  . URINE MICROALBUMIN  02/18/2018 (Originally 08/23/1956)  . HEMOGLOBIN A1C  03/01/2018  . FOOT EXAM  07/22/2018  . COLONOSCOPY  04/28/2027  . INFLUENZA VACCINE  Completed  . PNA vac Low Risk Adult  Completed   No flowsheet data found. Functional Status Survey:    Vitals:   01/18/18 1153  BP: 97/63  Pulse: 90  Resp: 18  Temp: (!) 96.5 F (35.8 C)  TempSrc: Oral  SpO2: 91%  Weight: 208 lb 3.2 oz (94.4 kg)  Height: 5\' 8"  (1.727 m)   Body mass index is 31.66 kg/m. Physical Exam  Constitutional: He appears well-developed and well-nourished.  HENT:  Head: Normocephalic.  Mouth/Throat: Oropharynx is clear and moist.  Eyes: Pupils are equal, round, and reactive to light.  Neck: Neck supple.  Cardiovascular: Normal rate and normal heart  sounds.  No murmur heard. Pulmonary/Chest: Effort normal and breath sounds normal. No respiratory distress. He has no wheezes. He has no rales.  Abdominal: Soft. Bowel sounds are normal. He exhibits no distension. There is no tenderness. There is no rebound.  Musculoskeletal: He exhibits no edema.  Lymphadenopathy:    He has no cervical adenopathy.  Neurological: He is alert.  Skin: Skin is warm and dry.  Psychiatric: He has a normal mood and affect. His behavior is normal.      Labs reviewed: Recent Labs    06/07/17 0800 06/08/17 0519 06/10/17 0727  07/13/17 0700 09/01/17 0731 11/14/17 0435 12/10/17 0950  NA 135 134* 137   < > 136 138 146* 140  K 3.1* 3.5 3.2*   < > 4.1 4.2 5.9* 5.3*  CL 97* 97* 100*   < > 100* 105 113*  --   CO2 27 27 26    < > 26 20* 22  --   GLUCOSE 100* 96  146*   < > 146* 196* 142* 160*  BUN 43* 50* 34*   < > 43* 108* 55*  --   CREATININE 5.53* 5.68* 4.96*   < > 6.50* 8.56* 8.69*  --   CALCIUM 7.9* 8.0* 8.4*   < > 8.7* 8.4* 8.7*  --   PHOS 5.3* 5.1* 3.7  --   --   --   --   --    < > = values in this interval not displayed.   Recent Labs    05/17/17 1558 05/18/17 1026  06/03/17 0055  06/07/17 0800 06/08/17 0519 06/10/17 0727  AST 36 29  --  16  --   --   --   --   ALT 17 15*  --  15*  --   --   --   --   ALKPHOS 102 92  --  87  --   --   --   --   BILITOT 1.0 0.9  --  0.5  --   --   --   --   PROT 5.9* 5.3*  --  6.5  --   --   --   --   ALBUMIN 2.3* 2.0*   < > 2.4*   < > 2.0* 2.0* 2.1*   < > = values in this interval not displayed.   Recent Labs    07/13/17 0700 09/01/17 0731 11/14/17 0435 12/10/17 0950  WBC 7.1 5.8 5.2  --   NEUTROABS 4.1 3.3 2.6  --   HGB 12.6* 11.7* 10.3* 11.6*  HCT 39.6 35.7* 31.3* 34.0*  MCV 100.3* 100.3* 109.1*  --   PLT 181 149* 130*  --    Lab Results  Component Value Date   TSH 2.197 11/14/2017   Lab Results  Component Value Date   HGBA1C 6.8 (H) 09/01/2017   Lab Results  Component Value Date    CHOL 104 07/13/2017   HDL 34 (L) 07/13/2017   LDLCALC 28 07/13/2017   TRIG 208 (H) 07/13/2017   CHOLHDL 3.1 07/13/2017    Significant Diagnostic Results in last 30 days:  No results found.  Assessment/Plan  Diabetes Mellitus Patient BS are consistently above 200-250 He has gained almost 30 lbs. He is on Tradjenta Will start him on Low dose Lantus. Increase Accu checks to TID. Repeat A1C Diabetic Eye Exam Hyperlipidemia LDL 28 in 09/18 On statin Will repeat fasting Profile  ESRD On Dialysis Gets Midodrine for His low BP on Dialysis day  S/P colectomy Appetite has improved and is doing well with his weight Benign prostatic hyperplasia  On Flomax   Iron deficiency anemia, On Nepro Hgb Stable Hypothyroid Continue same dose of Synthroid  TSH normal in 01/19 Disposition D/W Nurses Patient family not very eager to take him home. He will continue to stay here in facility. I have told patient he can go and visit them over the [employer] staff Communication:   Labs/tests ordered:

## 2018-01-19 ENCOUNTER — Other Ambulatory Visit: Payer: Self-pay

## 2018-01-19 ENCOUNTER — Inpatient Hospital Stay (HOSPITAL_COMMUNITY)
Admission: EM | Admit: 2018-01-19 | Discharge: 2018-01-22 | DRG: 391 | Disposition: A | Discharge status: Skilled Nursing Facility | Payer: Medicare Other | Attending: Family Medicine | Admitting: Family Medicine

## 2018-01-19 ENCOUNTER — Encounter (HOSPITAL_COMMUNITY)
Admission: RE | Admit: 2018-01-19 | Discharge: 2018-01-19 | Disposition: A | Discharge status: Home / Self Care | Payer: Medicare Other | Source: Skilled Nursing Facility | Attending: Internal Medicine | Admitting: Internal Medicine

## 2018-01-19 ENCOUNTER — Emergency Department (HOSPITAL_COMMUNITY): Payer: Medicare Other

## 2018-01-19 ENCOUNTER — Encounter (HOSPITAL_COMMUNITY): Payer: Self-pay | Admitting: Emergency Medicine

## 2018-01-19 DIAGNOSIS — A084 Viral intestinal infection, unspecified: Secondary | ICD-10-CM | POA: Diagnosis not present

## 2018-01-19 DIAGNOSIS — E1165 Type 2 diabetes mellitus with hyperglycemia: Secondary | ICD-10-CM | POA: Diagnosis present

## 2018-01-19 DIAGNOSIS — Z9181 History of falling: Secondary | ICD-10-CM | POA: Insufficient documentation

## 2018-01-19 DIAGNOSIS — K219 Gastro-esophageal reflux disease without esophagitis: Secondary | ICD-10-CM | POA: Diagnosis present

## 2018-01-19 DIAGNOSIS — Z86010 Personal history of colonic polyps: Secondary | ICD-10-CM

## 2018-01-19 DIAGNOSIS — E039 Hypothyroidism, unspecified: Secondary | ICD-10-CM

## 2018-01-19 DIAGNOSIS — Z8719 Personal history of other diseases of the digestive system: Secondary | ICD-10-CM | POA: Diagnosis not present

## 2018-01-19 DIAGNOSIS — I120 Hypertensive chronic kidney disease with stage 5 chronic kidney disease or end stage renal disease: Secondary | ICD-10-CM | POA: Diagnosis not present

## 2018-01-19 DIAGNOSIS — R000 Tachycardia, unspecified: Secondary | ICD-10-CM | POA: Diagnosis present

## 2018-01-19 DIAGNOSIS — N401 Enlarged prostate with lower urinary tract symptoms: Secondary | ICD-10-CM | POA: Diagnosis present

## 2018-01-19 DIAGNOSIS — R1110 Vomiting, unspecified: Secondary | ICD-10-CM | POA: Diagnosis not present

## 2018-01-19 DIAGNOSIS — R5081 Fever presenting with conditions classified elsewhere: Secondary | ICD-10-CM | POA: Diagnosis not present

## 2018-01-19 DIAGNOSIS — D631 Anemia in chronic kidney disease: Secondary | ICD-10-CM | POA: Diagnosis present

## 2018-01-19 DIAGNOSIS — Z9079 Acquired absence of other genital organ(s): Secondary | ICD-10-CM | POA: Diagnosis not present

## 2018-01-19 DIAGNOSIS — N186 End stage renal disease: Secondary | ICD-10-CM | POA: Diagnosis present

## 2018-01-19 DIAGNOSIS — K8689 Other specified diseases of pancreas: Secondary | ICD-10-CM | POA: Diagnosis present

## 2018-01-19 DIAGNOSIS — E1122 Type 2 diabetes mellitus with diabetic chronic kidney disease: Secondary | ICD-10-CM | POA: Diagnosis not present

## 2018-01-19 DIAGNOSIS — E1129 Type 2 diabetes mellitus with other diabetic kidney complication: Secondary | ICD-10-CM | POA: Diagnosis not present

## 2018-01-19 DIAGNOSIS — I10 Essential (primary) hypertension: Secondary | ICD-10-CM | POA: Diagnosis not present

## 2018-01-19 DIAGNOSIS — R338 Other retention of urine: Secondary | ICD-10-CM | POA: Diagnosis present

## 2018-01-19 DIAGNOSIS — D500 Iron deficiency anemia secondary to blood loss (chronic): Secondary | ICD-10-CM | POA: Insufficient documentation

## 2018-01-19 DIAGNOSIS — N134 Hydroureter: Secondary | ICD-10-CM | POA: Diagnosis present

## 2018-01-19 DIAGNOSIS — Z87448 Personal history of other diseases of urinary system: Secondary | ICD-10-CM

## 2018-01-19 DIAGNOSIS — F79 Unspecified intellectual disabilities: Secondary | ICD-10-CM | POA: Diagnosis present

## 2018-01-19 DIAGNOSIS — E785 Hyperlipidemia, unspecified: Secondary | ICD-10-CM | POA: Diagnosis present

## 2018-01-19 DIAGNOSIS — Z992 Dependence on renal dialysis: Secondary | ICD-10-CM

## 2018-01-19 DIAGNOSIS — Z9049 Acquired absence of other specified parts of digestive tract: Secondary | ICD-10-CM | POA: Insufficient documentation

## 2018-01-19 DIAGNOSIS — Z9862 Peripheral vascular angioplasty status: Secondary | ICD-10-CM

## 2018-01-19 DIAGNOSIS — D509 Iron deficiency anemia, unspecified: Secondary | ICD-10-CM | POA: Diagnosis present

## 2018-01-19 DIAGNOSIS — K529 Noninfective gastroenteritis and colitis, unspecified: Secondary | ICD-10-CM | POA: Diagnosis not present

## 2018-01-19 DIAGNOSIS — R279 Unspecified lack of coordination: Secondary | ICD-10-CM | POA: Insufficient documentation

## 2018-01-19 DIAGNOSIS — A419 Sepsis, unspecified organism: Secondary | ICD-10-CM

## 2018-01-19 DIAGNOSIS — N200 Calculus of kidney: Secondary | ICD-10-CM | POA: Diagnosis not present

## 2018-01-19 DIAGNOSIS — Z79890 Hormone replacement therapy (postmenopausal): Secondary | ICD-10-CM

## 2018-01-19 DIAGNOSIS — E782 Mixed hyperlipidemia: Secondary | ICD-10-CM | POA: Diagnosis not present

## 2018-01-19 DIAGNOSIS — Z79899 Other long term (current) drug therapy: Secondary | ICD-10-CM

## 2018-01-19 DIAGNOSIS — N3289 Other specified disorders of bladder: Secondary | ICD-10-CM | POA: Diagnosis not present

## 2018-01-19 DIAGNOSIS — D638 Anemia in other chronic diseases classified elsewhere: Secondary | ICD-10-CM | POA: Diagnosis not present

## 2018-01-19 DIAGNOSIS — I9589 Other hypotension: Secondary | ICD-10-CM | POA: Diagnosis present

## 2018-01-19 DIAGNOSIS — R509 Fever, unspecified: Secondary | ICD-10-CM

## 2018-01-19 DIAGNOSIS — Z794 Long term (current) use of insulin: Secondary | ICD-10-CM

## 2018-01-19 DIAGNOSIS — M6281 Muscle weakness (generalized): Secondary | ICD-10-CM | POA: Diagnosis not present

## 2018-01-19 DIAGNOSIS — R2689 Other abnormalities of gait and mobility: Secondary | ICD-10-CM | POA: Diagnosis not present

## 2018-01-19 DIAGNOSIS — F70 Mild intellectual disabilities: Secondary | ICD-10-CM | POA: Diagnosis not present

## 2018-01-19 DIAGNOSIS — M898X9 Other specified disorders of bone, unspecified site: Secondary | ICD-10-CM | POA: Diagnosis present

## 2018-01-19 LAB — CBC WITH DIFFERENTIAL/PLATELET
Basophils Absolute: 0 10*3/uL (ref 0.0–0.1)
Basophils Absolute: 0 10*3/uL (ref 0.0–0.1)
Basophils Relative: 0 %
Basophils Relative: 0 %
Eosinophils Absolute: 0 10*3/uL (ref 0.0–0.7)
Eosinophils Absolute: 0 10*3/uL (ref 0.0–0.7)
Eosinophils Relative: 0 %
Eosinophils Relative: 0 %
HCT: 36.3 % — ABNORMAL LOW (ref 39.0–52.0)
HCT: 34.8 % — ABNORMAL LOW (ref 39.0–52.0)
Hemoglobin: 11.6 g/dL — ABNORMAL LOW (ref 13.0–17.0)
Hemoglobin: 11 g/dL — ABNORMAL LOW (ref 13.0–17.0)
Lymphocytes Relative: 13 %
Lymphocytes Relative: 15 %
Lymphs Abs: 1.2 10*3/uL (ref 0.7–4.0)
Lymphs Abs: 1.1 10*3/uL (ref 0.7–4.0)
MCH: 33.9 pg (ref 26.0–34.0)
MCH: 33.6 pg (ref 26.0–34.0)
MCHC: 32 g/dL (ref 30.0–36.0)
MCHC: 31.6 g/dL (ref 30.0–36.0)
MCV: 106.1 fL — ABNORMAL HIGH (ref 78.0–100.0)
MCV: 106.4 fL — ABNORMAL HIGH (ref 78.0–100.0)
Monocytes Absolute: 0.9 10*3/uL (ref 0.1–1.0)
Monocytes Absolute: 1 10*3/uL (ref 0.1–1.0)
Monocytes Relative: 10 %
Monocytes Relative: 14 %
Neutro Abs: 6.8 10*3/uL (ref 1.7–7.7)
Neutro Abs: 5.3 10*3/uL (ref 1.7–7.7)
Neutrophils Relative %: 77 %
Neutrophils Relative %: 71 %
Platelets: 145 10*3/uL — ABNORMAL LOW (ref 150–400)
Platelets: 143 10*3/uL — ABNORMAL LOW (ref 150–400)
RBC: 3.42 MIL/uL — ABNORMAL LOW (ref 4.22–5.81)
RBC: 3.27 MIL/uL — ABNORMAL LOW (ref 4.22–5.81)
RDW: 13.4 % (ref 11.5–15.5)
RDW: 13.6 % (ref 11.5–15.5)
WBC: 8.9 10*3/uL (ref 4.0–10.5)
WBC: 7.4 10*3/uL (ref 4.0–10.5)

## 2018-01-19 LAB — COMPREHENSIVE METABOLIC PANEL
ALT: 18 U/L (ref 17–63)
AST: 22 U/L (ref 15–41)
Albumin: 3.3 g/dL — ABNORMAL LOW (ref 3.5–5.0)
Alkaline Phosphatase: 152 U/L — ABNORMAL HIGH (ref 38–126)
Anion gap: 16 — ABNORMAL HIGH (ref 5–15)
BUN: 88 mg/dL — ABNORMAL HIGH (ref 6–20)
CO2: 20 mmol/L — ABNORMAL LOW (ref 22–32)
Calcium: 7.8 mg/dL — ABNORMAL LOW (ref 8.9–10.3)
Chloride: 99 mmol/L — ABNORMAL LOW (ref 101–111)
Creatinine, Ser: 10.85 mg/dL — ABNORMAL HIGH (ref 0.61–1.24)
GFR calc Af Amer: 5 mL/min — ABNORMAL LOW (ref >60)
GFR calc non Af Amer: 4 mL/min — ABNORMAL LOW (ref >60)
Glucose, Bld: 208 mg/dL — ABNORMAL HIGH (ref 65–99)
Potassium: 5.1 mmol/L (ref 3.5–5.1)
Sodium: 135 mmol/L (ref 135–145)
Total Bilirubin: 0.8 mg/dL (ref 0.3–1.2)
Total Protein: 6.8 g/dL (ref 6.5–8.1)

## 2018-01-19 LAB — HEMOGLOBIN A1C
Hgb A1c MFr Bld: 7.6 % — ABNORMAL HIGH (ref 4.8–5.6)
Mean Plasma Glucose: 171.42 mg/dL

## 2018-01-19 LAB — VITAMIN B12: Vitamin B-12: 345 pg/mL (ref 180–914)

## 2018-01-19 LAB — I-STAT CG4 LACTIC ACID, ED: Lactic Acid, Venous: 2.68 mmol/L (ref 0.5–1.9)

## 2018-01-19 LAB — BASIC METABOLIC PANEL
Anion gap: 18 — ABNORMAL HIGH (ref 5–15)
BUN: 81 mg/dL — ABNORMAL HIGH (ref 6–20)
CO2: 20 mmol/L — ABNORMAL LOW (ref 22–32)
Calcium: 8.4 mg/dL — ABNORMAL LOW (ref 8.9–10.3)
Chloride: 99 mmol/L — ABNORMAL LOW (ref 101–111)
Creatinine, Ser: 10.26 mg/dL — ABNORMAL HIGH (ref 0.61–1.24)
GFR calc Af Amer: 5 mL/min — ABNORMAL LOW (ref >60)
GFR calc non Af Amer: 4 mL/min — ABNORMAL LOW (ref >60)
Glucose, Bld: 217 mg/dL — ABNORMAL HIGH (ref 65–99)
Potassium: 4.5 mmol/L (ref 3.5–5.1)
Sodium: 137 mmol/L (ref 135–145)

## 2018-01-19 LAB — LIPID PANEL
Cholesterol: 130 mg/dL (ref 0–200)
HDL: 22 mg/dL — ABNORMAL LOW (ref >40)
LDL Cholesterol: UNDETERMINED mg/dL (ref 0–99)
Total CHOL/HDL Ratio: 5.9 RATIO
Triglycerides: 520 mg/dL — ABNORMAL HIGH (ref <150)
VLDL: UNDETERMINED mg/dL (ref 0–40)

## 2018-01-19 LAB — I-STAT TROPONIN, ED: Troponin i, poc: 0.01 ng/mL (ref 0.00–0.08)

## 2018-01-19 MED ORDER — TAMSULOSIN HCL 0.4 MG PO CAPS
0.4000 mg | ORAL_CAPSULE | Freq: Every evening | ORAL | Status: DC
  Administered 2018-01-20 – 2018-01-21 (×3): 0.4 mg via ORAL
  Filled 2018-01-19 (×3): qty 1

## 2018-01-19 MED ORDER — HEPARIN SODIUM (PORCINE) 5000 UNIT/ML IJ SOLN
5000.0000 [IU] | Freq: Three times a day (TID) | INTRAMUSCULAR | Status: DC
  Administered 2018-01-20 – 2018-01-21 (×5): 5000 [IU] via SUBCUTANEOUS
  Filled 2018-01-19 (×5): qty 1

## 2018-01-19 MED ORDER — VANCOMYCIN HCL IN DEXTROSE 1-5 GM/200ML-% IV SOLN: 1000.0000 mg | Freq: Once | INTRAVENOUS | Status: DC

## 2018-01-19 MED ORDER — SEVELAMER CARBONATE 800 MG PO TABS
3200.0000 mg | ORAL_TABLET | Freq: Three times a day (TID) | ORAL | Status: DC
  Administered 2018-01-20 – 2018-01-21 (×3): 3200 mg via ORAL
  Filled 2018-01-19 (×6): qty 4

## 2018-01-19 MED ORDER — MIDODRINE HCL 5 MG PO TABS: 10.0000 mg | ORAL_TABLET | ORAL | Status: DC | PRN

## 2018-01-19 MED ORDER — ACETAMINOPHEN 325 MG PO TABS: 650.0000 mg | ORAL_TABLET | Freq: Four times a day (QID) | ORAL | Status: DC | PRN

## 2018-01-19 MED ORDER — INSULIN ASPART 100 UNIT/ML ~~LOC~~ SOLN
0.0000 [IU] | SUBCUTANEOUS | Status: DC
  Administered 2018-01-20: 2 [IU] via SUBCUTANEOUS
  Administered 2018-01-20: 3 [IU] via SUBCUTANEOUS

## 2018-01-19 MED ORDER — ACETAMINOPHEN 650 MG RE SUPP: 650.0000 mg | Freq: Four times a day (QID) | RECTAL | Status: DC | PRN

## 2018-01-19 MED ORDER — VANCOMYCIN HCL IN DEXTROSE 1-5 GM/200ML-% IV SOLN: 1000.0000 mg | INTRAVENOUS | Status: DC

## 2018-01-19 MED ORDER — ADULT MULTIVITAMIN W/MINERALS CH
1.0000 | ORAL_TABLET | Freq: Every evening | ORAL | Status: DC
  Administered 2018-01-20 – 2018-01-21 (×2): 1 via ORAL
  Filled 2018-01-19 (×2): qty 1

## 2018-01-19 MED ORDER — LEVOTHYROXINE SODIUM 100 MCG IV SOLR
37.5000 ug | Freq: Every day | INTRAVENOUS | Status: DC
  Administered 2018-01-20 – 2018-01-22 (×3): 37.5 ug via INTRAVENOUS
  Filled 2018-01-19 (×7): qty 5

## 2018-01-19 MED ORDER — VANCOMYCIN HCL 10 G IV SOLR
2000.0000 mg | Freq: Once | INTRAVENOUS | Status: AC
  Administered 2018-01-19: 2000 mg via INTRAVENOUS
  Filled 2018-01-19: qty 2000

## 2018-01-19 MED ORDER — NEPRO/CARBSTEADY PO LIQD
237.0000 mL | Freq: Two times a day (BID) | ORAL | Status: DC
  Administered 2018-01-20 – 2018-01-22 (×3): 237 mL via ORAL

## 2018-01-19 MED ORDER — SODIUM CHLORIDE 0.9 % IV SOLN: 250.0000 mL | INTRAVENOUS | Status: DC | PRN

## 2018-01-19 MED ORDER — PANTOPRAZOLE SODIUM 40 MG PO TBEC
40.0000 mg | DELAYED_RELEASE_TABLET | Freq: Every day | ORAL | Status: DC
  Administered 2018-01-20 – 2018-01-22 (×3): 40 mg via ORAL
  Filled 2018-01-19 (×3): qty 1

## 2018-01-19 MED ORDER — PIPERACILLIN-TAZOBACTAM 3.375 G IVPB 30 MIN
3.3750 g | Freq: Once | INTRAVENOUS | Status: AC
  Administered 2018-01-19: 3.375 g via INTRAVENOUS
  Filled 2018-01-19: qty 50

## 2018-01-19 MED ORDER — ONDANSETRON HCL 4 MG PO TABS: 4.0000 mg | ORAL_TABLET | Freq: Four times a day (QID) | ORAL | Status: DC | PRN

## 2018-01-19 MED ORDER — IOPAMIDOL (ISOVUE-300) INJECTION 61%
100.0000 mL | Freq: Once | INTRAVENOUS | Status: AC | PRN
Start: 2018-01-19 — End: 2018-01-19
  Administered 2018-01-19: 100 mL via INTRAVENOUS

## 2018-01-19 MED ORDER — SODIUM CHLORIDE 0.9% FLUSH: 3.0000 mL | INTRAVENOUS | Status: DC | PRN

## 2018-01-19 MED ORDER — ONDANSETRON HCL 4 MG/2ML IJ SOLN: 4.0000 mg | Freq: Four times a day (QID) | INTRAMUSCULAR | Status: DC | PRN

## 2018-01-19 MED ORDER — SODIUM CHLORIDE 0.9% FLUSH
3.0000 mL | Freq: Two times a day (BID) | INTRAVENOUS | Status: DC
  Administered 2018-01-20 – 2018-01-22 (×5): 3 mL via INTRAVENOUS

## 2018-01-19 MED ORDER — SODIUM CHLORIDE 0.9 % IV BOLUS
250.0000 mL | Freq: Once | INTRAVENOUS | Status: AC
  Administered 2018-01-19: 250 mL via INTRAVENOUS

## 2018-01-19 MED ORDER — PIPERACILLIN-TAZOBACTAM 3.375 G IVPB
3.3750 g | Freq: Two times a day (BID) | INTRAVENOUS | Status: DC
  Administered 2018-01-20: 3.375 g via INTRAVENOUS
  Filled 2018-01-19: qty 50

## 2018-01-19 NOTE — ED Notes (Signed)
Report given to floor

## 2018-01-19 NOTE — ED Triage Notes (Signed)
Pt from [facility].  C/o Vomiting since last night.  Denies any pain at this time.  Last time pt vomited was last night per pt.

## 2018-01-19 NOTE — H&P (Signed)
History and Physical    Zachary [name]:[id number] DOB: Mar 17, 1946 DOA: 01/19/2018  PCP: [name], MD   Patient coming from: [facility]  Chief Complaint: Vomiting and fever  HPI: Zachary [name] is a 72 y.o. male with medical history significant for ESRD on HD, Tuesday, Thursday, Saturday, type 2 diabetes, hypothyroidism, BPH, dyslipidemia, and chronic anemia who presents to the emergency department after onset of vomiting and abdominal pain in the epigastric region that began last night.  As a result of his symptoms, he did not go to hemodialysis today and could not maintain adequate oral intake.  The patient is a very poor historian as he has mental retardation and cannot seem to answer several questions appropriately.  According to the ED physician 2 brothers were at the bedside earlier, but they did not know much about his situation either.  It appears that he was seen by Dr. Lyndel Safe for a primary care visit just yesterday with plans to start low-dose Lantus on account of poor blood glucose readings.  None of the symptoms appear to have been present during the visit yesterday.   ED Course: Vital signs demonstrate tachycardia, and fever with temperature up to 100.8 Fahrenheit.  His blood pressures appear to remain stable however and he does not appear tachypneic.  He is currently on room air with adequate oxygen saturations in the 90th percentile.  His laboratory data is remarkable for glucose of 208, there appears to be no leukocytosis and he has stable anemia with elevated creatinine levels reflecting his end-stage renal disease.  According to ED physician, lactic acid was approximately 2.5, but has not been entered into the database.  CT of the abdomen and pelvis with contrast demonstrated findings consistent with enteritis of the small bowel with bilateral hydroureter, but no finding of obstruction and a 14 mm pancreatic head lesion.  Chest x-ray with no acute findings aside from low  lung volumes.  Review of Systems: Cannot be adequately obtained due to patient condition.  Past Medical History:  Diagnosis Date  . Abnormal CT scan, kidney 10/06/2011  . Acute pyelonephritis 10/07/2011  . Anemia    normocytic  . Anxiety    mental retardation  . Bladder wall thickening 10/06/2011  . BPH (benign prostatic hypertrophy)   . Diabetes mellitus   . Dialysis patient [facility])    Tuesday, Thursday and Saturday,   . Edema     history of lower extremity edema  . GERD (gastroesophageal reflux disease)   . Heme positive stool   . Hydronephrosis   . Hyperkalemia   . Hyperlipidemia   . Hypernatremia   . Hypertension   . Hypothyroidism   . Impaired speech   . 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 MR (mental retardation)   . Muscle weakness   . Obstructive uropathy   . 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 Poor historian poor historian  . Protein calorie malnutrition ([place])   . Pyelonephritis   . Renal failure (ARF), acute on chronic (HCC)   . Renal insufficiency    chronic history  . Sepsis ([place])   . Smoking   . Uremia   . Urinary retention   . UTI (lower urinary tract infection) 10/06/2011    Past Surgical History:  Procedure Laterality Date  . AV FISTULA PLACEMENT Left 07/06/2015   Procedure:  INSERTION LEFT ARM ARTERIOVENOUS GORTEX GRAFT;  Surgeon: Angelia Mould, MD;  Location: [place];  Service: Vascular;  Laterality: Left;  .  AV FISTULA PLACEMENT Right 02/26/2016   Procedure: ARTERIOVENOUS (AV) FISTULA CREATION ;  Surgeon: Angelia Mould, MD;  Location: [place];  Service: Vascular;  Laterality: Right;  . [place] REMOVAL Left 10/09/2015   Procedure: REMOVAL OF ARTERIOVENOUS GORETEX GRAFT ([place]) Evacuation of Lymphocele, Vein Patch angioplasty of brachial artery.;  Surgeon: Angelia Mould, MD;  Location: [place];  Service: Vascular;  Laterality: Left;  . BASCILIC VEIN TRANSPOSITION Right 02/26/2016   Procedure: Right BASCILIC VEIN TRANSPOSITION;   Surgeon: Angelia Mould, MD;  Location: [place];  Service: Vascular;  Laterality: Right;  . CIRCUMCISION N/A 01/04/2014   Procedure: CIRCUMCISION ADULT (procedure #1);  Surgeon: Marissa Nestle, MD;  Location: [facility];  Service: Urology;  Laterality: N/A;  . COLECTOMY N/A 05/04/2017   Procedure: TOTAL COLECTOMY;  Surgeon: Aviva Signs, MD;  Location: [facility];  Service: General;  Laterality: N/A;  . COLONOSCOPY N/A 04/27/2017   Procedure: COLONOSCOPY;  Surgeon: Daneil Dolin, MD;  [facility];  Service: Endoscopy;  Laterality: N/A;  245  . CYSTOSCOPY W/ RETROGRADES Bilateral 06/29/2015   Procedure: CYSTOSCOPY, DILATION OF URETHRAL STRICTURE WITH BILATERAL RETROGRADE PYELOGRAM,SUPRAPUBIC TUBE CHANGE;  Surgeon: Festus Aloe, MD;  Location: [facility];  Service: Urology;  Laterality: Bilateral;  . CYSTOSCOPY WITH URETHRAL DILATATION N/A 12/29/2013   Procedure: CYSTOSCOPY WITH URETHRAL DILATATION;  Surgeon: Marissa Nestle, MD;  [facility];  Service: Urology;  Laterality: N/A;  . ESOPHAGOGASTRODUODENOSCOPY N/A 04/27/2017   Procedure: ESOPHAGOGASTRODUODENOSCOPY (EGD);  Surgeon: Daneil Dolin, MD;  [facility];  Service: Endoscopy;  Laterality: N/A;  . ORIF FEMUR FRACTURE Right 11/22/2016   Procedure: OPEN REDUCTION INTERNAL FIXATION (ORIF) DISTAL FEMUR FRACTURE;  Surgeon: Rod Can, MD;  Location: [place];  Service: Orthopedics;  Laterality: Right;  . PATCH ANGIOPLASTY Right 12/10/2017   Procedure: PATCH ANGIOPLASTY;  Surgeon: Angelia Mould, MD;  Location: [facility];  Service: Vascular;  Laterality: Right;  . PERIPHERAL VASCULAR CATHETERIZATION N/A 10/08/2015   Procedure: A/V Shuntogram;  Surgeon: Angelia Mould, MD;  Location: [facility];  Service: Cardiovascular;  Laterality: N/A;  . THROMBECTOMY W/ EMBOLECTOMY Right 12/10/2017   Procedure: THROMBECTOMY REVISION RIGHT ARM  ARTERIOVENOUS FISTULA;  Surgeon: Angelia Mould, MD;  Location: [place];   Service: Vascular;  Laterality: Right;  . TRANSURETHRAL RESECTION OF PROSTATE N/A 01/04/2014   Procedure: TRANSURETHRAL RESECTION OF THE PROSTATE (TURP) (procedure #2);  Surgeon: Marissa Nestle, MD;  [facility];  Service: Urology;  Laterality: N/A;     reports that he has never smoked. He has never used smokeless tobacco. He reports that he does not drink alcohol or use drugs.  No Known Allergies  Family History  Problem Relation Age of Onset  . Cancer Mother   . Colon cancer Neg Hx     Prior to Admission medications   Medication Sig Start Date End Date Taking? Authorizing Provider  atorvastatin (LIPITOR) 40 MG tablet Take 40 mg by mouth every evening.   Yes [provider]  insulin glargine (LANTUS) 100 UNIT/ML injection Inject 5 Units into the skin at bedtime.   Yes [provider]  levothyroxine (SYNTHROID, LEVOTHROID) 75 MCG tablet Take 75 mcg by mouth daily before breakfast.    Yes [provider]  linagliptin (TRADJENTA) 5 MG TABS tablet Take 5 mg by mouth every evening.    Yes [provider]  midodrine (PROAMATINE) 10 MG tablet Take 10 mg by mouth. Send medication  with patient on Tue.Thu., Sat.,to dialysis do not give at [facility] give to take with him   Yes [provider]  Multiple Vitamin (MULTIVITAMIN WITH MINERALS) TABS tablet Take 1 tablet by mouth every evening.    Yes [provider]  Nutritional Supplements (NEPRO/CARBSTEADY PO) Take 1 Can by mouth 2 (two) times daily.   Yes [provider]  omeprazole (PRILOSEC) 40 MG capsule Take 40 mg by mouth at bedtime.    Yes [provider]  sevelamer carbonate (RENVELA) 800 MG tablet Take 3,200 mg by mouth 3 (three) times daily with meals.    Yes [provider]  tamsulosin (FLOMAX) 0.4 MG CAPS capsule Take 0.4 mg by mouth every evening. Give 30 minutes after a meal   Yes [provider]  torsemide (DEMADEX) 10 MG tablet Take 5  tablets (50 mg total) by mouth daily. 04/07/16  Yes [name], MD  Neo-Bacit-Poly-Lidocaine (FIRST AID PLUS LIDOCAINE EX) Apply 1 application topically Every Tuesday,Thursday,and Saturday with dialysis. Prior to dialysis    [provider]    Physical Exam: Vitals:   01/19/18 1330 01/19/18 1400 01/19/18 1430 01/19/18 1500  BP: 132/75 130/80 139/73 135/74  Pulse: (!) 116 (!) 113 (!) 120 (!) 115  Resp: 11 (!) 23 17 (!) 24  Temp:      TempSrc:      SpO2: 98% 97% 99% 98%  Weight:      Height:        Constitutional: NAD, calm, comfortable Vitals:   01/19/18 1330 01/19/18 1400 01/19/18 1430 01/19/18 1500  BP: 132/75 130/80 139/73 135/74  Pulse: (!) 116 (!) 113 (!) 120 (!) 115  Resp: 11 (!) 23 17 (!) 24  Temp:      TempSrc:      SpO2: 98% 97% 99% 98%  Weight:      Height:       Eyes: lids and conjunctivae normal ENMT: Mucous membranes are moist.  Neck: normal, supple Respiratory: clear to auscultation bilaterally. Normal respiratory effort. No accessory muscle use.  Cardiovascular: Regular rate and rhythm, no murmurs. No extremity edema. Abdomen: no tenderness, no distention. Bowel sounds positive.  Musculoskeletal:  No joint deformity upper and lower extremities.   Skin: no rashes, lesions, ulcers.  Psychiatric: Normal judgment and insight. Alert and oriented x 3. Normal mood.   Labs on Admission: I have personally reviewed following labs and imaging studies  CBC: Recent Labs  Lab 01/19/18 0700 01/19/18 1608  WBC 8.9 7.4  NEUTROABS 6.8 5.3  HGB 11.6* 11.0*  HCT 36.3* 34.8*  MCV 106.1* 106.4*  PLT 145* 195*   Basic Metabolic Panel: Recent Labs  Lab 01/19/18 0700 01/19/18 1608  NA 137 135  K 4.5 5.1  CL 99* 99*  CO2 20* 20*  GLUCOSE 217* 208*  BUN 81* 88*  CREATININE 10.26* 10.85*  CALCIUM 8.4* 7.8*   GFR: Estimated Creatinine Clearance: 7.4 mL/min (A) (by C-G formula based on SCr of 10.85 mg/dL (H)). Liver Function Tests: Recent Labs  Lab  01/19/18 1608  AST 22  ALT 18  ALKPHOS 152*  BILITOT 0.8  PROT 6.8  ALBUMIN 3.3*   No results for input(s): LIPASE, AMYLASE in the last 168 hours. No results for input(s): AMMONIA in the last 168 hours. Coagulation Profile: No results for input(s): INR, PROTIME in the last 168 hours. Cardiac Enzymes: No results for input(s): CKTOTAL, CKMB, CKMBINDEX, TROPONINI in the last 168 hours. BNP (last 3 results) No results for  input(s): PROBNP in the last 8760 hours. HbA1C: Recent Labs    01/19/18 0700  HGBA1C 7.6*   CBG: No results for input(s): GLUCAP in the last 168 hours. Lipid Profile: Recent Labs    01/19/18 0700  CHOL 130  HDL 22*  LDLCALC UNABLE TO CALCULATE IF TRIGLYCERIDE OVER 400 mg/dL  TRIG 520*  CHOLHDL 5.9   Thyroid Function Tests: No results for input(s): TSH, T4TOTAL, FREET4, T3FREE, THYROIDAB in the last 72 hours. Anemia Panel: Recent Labs    01/19/18 0700  VITAMINB12 345   Urine analysis:    Component Value Date/Time   COLORURINE AMBER (A) 06/12/2017 0615   APPEARANCEUR HAZY (A) 06/12/2017 0615   LABSPEC 1.021 06/12/2017 0615   PHURINE 5.0 06/12/2017 0615   GLUCOSEU NEGATIVE 06/12/2017 0615   HGBUR NEGATIVE 06/12/2017 0615   BILIRUBINUR NEGATIVE 06/12/2017 0615   KETONESUR NEGATIVE 06/12/2017 0615   PROTEINUR 30 (A) 06/12/2017 0615   UROBILINOGEN 0.2 07/20/2015 1850   NITRITE NEGATIVE 06/12/2017 0615   LEUKOCYTESUR NEGATIVE 06/12/2017 0615    Radiological Exams on Admission: Ct Abdomen Pelvis W Contrast  Result Date: 01/19/2018 CLINICAL DATA:  Nausea and vomiting for 1 day. Generalized abdominal pain. End-stage renal disease on dialysis. EXAM: CT ABDOMEN AND PELVIS WITH CONTRAST TECHNIQUE: Multidetector CT imaging of the abdomen and pelvis was performed using the standard protocol following bolus administration of intravenous contrast. CONTRAST:  129mL ISOVUE-300 IOPAMIDOL (ISOVUE-300) INJECTION 61% COMPARISON:  05/17/2017 FINDINGS: Lower chest:  Minimal atelectasis in the lung bases. No pleural effusion. Coronary artery atherosclerosis. Hepatobiliary: There is a small area of ill-defined subtle hypoattenuation in the left hepatic lobe (series 2, image 13) which is unchanged from 10/05/2011 and likely benign. The gallbladder is completely decompressed with high density material in its lumen which may reflect small stones. No significant pericholecystic inflammatory changes are seen. There is no biliary dilatation. Pancreas: 14 x 8 mm low-density lesion in the pancreatic head, not well seen on the prior noncontrast examination and new from 2012. No pancreatic ductal dilatation or acute inflammatory changes. Spleen: Unremarkable. Adrenals/Urinary Tract: Unremarkable adrenal glands. Mildly dilated right extrarenal pelvis. Mild diffuse dilatation of both ureters to the bladder without an obstructing stone. Slight dilatation of the left renal pelvis. Unchanged 1.6 cm left renal cyst with a punctate calcification along its inferior margin. Subcentimeter low-density lesions in both kidneys are too small to fully characterize. There is no significant excreted contrast in the renal collecting systems on delayed images. The bladder is diffusely thick walled. Stomach/Bowel: The stomach is within normal limits. Sequelae of partial colectomy are again identified with an ileocolic anastomosis in the pelvis. There is inflammatory stranding in the right lower quadrant associated with multiple small bowel loops demonstrating mild wall thickening. There is also mild wall thickening of several small bowel loops in the left mid and left lower abdomen. No dilated loops of bowel are seen to indicate obstruction. Vascular/Lymphatic: Abdominal aortic atherosclerosis without aneurysm. No enlarged lymph nodes. Reproductive: Enlarged prostate indenting the bladder base. Other: No significant ascites. Postsurgical changes in the abdominal wall with abdominal wall laxity. Mild  subcutaneous fat stranding in the left lower quadrant abdominal wall, nonspecific but may relate to medication injection. Musculoskeletal: Progressive endplate irregularity at L5-S1 without disc space collapse, favored to be degenerative with disc bulging contributing to bilateral neural foraminal stenosis. No significant paravertebral inflammation to strongly suggest infection. IMPRESSION: 1. Wall thickening of multiple small bowel loops with inflammatory stranding in the right lower quadrant suggestive of  an infectious or inflammatory enteritis. No bowel obstruction. 2. Mild bilateral hydroureter without obstructing stone. Diffuse bladder wall thickening, correlate for urinary tract infection. 3. 14 mm low-density/cystic pancreatic head lesion. Follow-up pancreatic protocol abdominal CT is recommended in 2 years. 4. Collapsed gallbladder possibly containing small stones. 5.  Aortic Atherosclerosis (ICD10-I70.0). Electronically Signed   By: Logan Bores M.D.   On: 01/19/2018 17:51   Dg Chest Port 1 View  Result Date: 01/19/2018 CLINICAL DATA:  Vomiting.  Fever. EXAM: PORTABLE CHEST 1 VIEW COMPARISON:  06/03/2017. FINDINGS: Mediastinum heart size appears stable. Low lung volumes. No focal infiltrate. No pleural effusion or pneumothorax. No acute bony abnormality. IMPRESSION: Low lung volumes.  No acute cardiopulmonary disease. Electronically Signed   By: Marcello Moores  Register   On: 01/19/2018 16:09    EKG: Independently reviewed. Sinus tach 116 bpm.  Assessment/Plan Principal Problem:   Sepsis ([place]) Active Problems:   Anemia, chronic disease   Hyperlipidemia   ESRD (end stage renal disease) (HCC)   Hypothyroidism   Enteritis    1. Sepsis likely secondary to gastroenteritis.  Follow blood cultures which have been obtained.  Continue empiric vancomycin and Zosyn.  Avoid IV fluid at this time.  Lactic acid in a.m.  Patient noted to be hemodynamically stable, can be monitored on telemetry.  Continue on  Midrin for low blood pressure.  Maintain on clear liquid diet for now. 2. ESRD on HD.  Consult to nephrology for hemodialysis as patient has missed his session today on account of his symptoms.  He typically dialyzes Tuesday, Thursday, Saturday with last session on Saturday.  No significant volume overload or respiratory distress noted. 3. Pancreatic head lesion-14 mm.  Suggest follow-up CT in 2 years in the outpatient setting. 4. Type 2 diabetes with hyperglycemia.  Maintain on sliding scale insulin.  He was just started on low-dose Lantus yesterday which I will avoid for now.  He is noted to have blood glucose consistently above 200-250mg /dL range.  Hold oral agents. 5. Dyslipidemia.  Withhold statin on account of symptoms currently. 6. Hypothyroidism.  Continue Synthroid in IV form on account of current symptoms. 7. BPH.  Continue on Flomax. 8. Iron deficiency anemia-stable.  Continue on Nepro.   DVT prophylaxis: Heparin Code Status: Full Family Communication: None at bedside; 2 brothers were available earlier per EDP Disposition Plan: Sepsis treatment with empiric vancomycin and Zosyn and blood cultures to follow.  Inpatient hemodialysis. Consults called: Nephrology in computer Admission status: Inpatient, telemetry   [facility] Pager [phone]  If 7PM-7AM, please contact night-coverage [email or web address] Password [facility]  01/19/2018, 6:34 PM

## 2018-01-19 NOTE — Progress Notes (Addendum)
Pharmacy Antibiotic Note  Zachary [name] is a 73 y.o. male admitted on 01/19/2018 with sepsis. Patient is a dialysis patient who presents to the ER with vomiting, fever, and tachycardia.   Pharmacy has been consulted for Vancomycin and Zosyn dosing.  Plan: Vancomycin 2000 mg IV x 1 dose Vancomycin 1000 mg IV every dialysis (Tu-Th-Sat). Goal trough 15-20 mcg/mL. Zosyn 3.375g IV q12h (4 hour infusion) Monitor labs, c/s, and vanco trough as indicated  Height: 5\' 10"  (177.8 cm) Weight: 208 lb (94.3 kg) IBW/kg (Calculated) : 73  Temp (24hrs), Avg:100.4 F (38 C), Min:100 F (37.8 C), Max:100.8 F (38.2 C)  Recent Labs  Lab 01/19/18 0700  WBC 8.9  CREATININE 10.26*    Estimated Creatinine Clearance: 7.8 mL/min (A) (by C-G formula based on SCr of 10.26 mg/dL (H)).    No Known Allergies  Antimicrobials this admission: Zosyn 3/26 >>  Vanco 3/26 >>   Dose adjustments this admission: Zosyn and Vanco for HD  Microbiology results: 3/26 BCx: pending  Thank you for allowing pharmacy to be a part of this patient's care.  Margot Ables, PharmD Clinical Pharmacist 01/19/2018 4:33 PM

## 2018-01-19 NOTE — ED Provider Notes (Addendum)
[facility] EMERGENCY DEPARTMENT Provider Note   CSN: [id number] Arrival date & time: 01/19/18  0865     History   Chief Complaint Chief Complaint  Patient presents with  . Emesis    HPI Zachary [name] is a 72 y.o. male.  Patient sent in from [facility].  Reported history of onset of vomiting last night patient denied any pain upon arrival.  But on exam seems to have some abdominal pain.  [place] states last time he vomited was last night.  Patient does have a fever and is tachycardic.  He is a dialysis patient normally dialyzed Tuesday Thursdays and Saturdays.  Did not go to dialysis today.  Patient not very good historian difficult to assess exactly what is going on.     Past Medical History:  Diagnosis Date  . Abnormal CT scan, kidney 10/06/2011  . Acute pyelonephritis 10/07/2011  . Anemia    normocytic  . Anxiety    mental retardation  . Bladder wall thickening 10/06/2011  . BPH (benign prostatic hypertrophy)   . Diabetes mellitus   . Dialysis patient [facility])    Tuesday, Thursday and Saturday,   . Edema     history of lower extremity edema  . GERD (gastroesophageal reflux disease)   . Heme positive stool   . Hydronephrosis   . Hyperkalemia   . Hyperlipidemia   . Hypernatremia   . Hypertension   . Hypothyroidism   . Impaired speech   . 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 MR (mental retardation)   . Muscle weakness   . Obstructive uropathy   . 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 Poor historian poor historian  . Protein calorie malnutrition ([place])   . Pyelonephritis   . Renal failure (ARF), acute on chronic (HCC)   . Renal insufficiency    chronic history  . Sepsis ([place])   . Smoking   . Uremia   . Urinary retention   . UTI (lower urinary tract infection) 10/06/2011    Patient Active Problem List   Diagnosis Date Noted  . S/P colectomy 08/31/2017  . Colitis 05/17/2017  . Dysplastic polyp of colon   . Acute blood loss anemia 05/01/2017  .  ESRD (end stage renal disease) ([place]) 05/01/2017  . Colonoscopy causing post-procedural bleeding   . Lower GI bleed   . DVT of leg (deep venous thrombosis) ([place]) 12/25/2016  . Closed comminuted intra-articular fracture of distal femur, right, initial encounter ([place]) 11/22/2016  . Hydronephrosis, bilateral 03/10/2016  . 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 Abnormal CT scan, esophagus   . Protein-calorie malnutrition, severe ([place]) 07/10/2015  . Essential hypertension 07/08/2015  . Hyperlipidemia 07/08/2015  . Obstructive uropathy 04/17/2015  . Bladder neck contracture   . Anemia, chronic disease 04/16/2015  . Benign prostatic hyperplasia with urinary retention 03/03/2015  . Mental retardation 10/10/2011  . Acute pyelonephritis 10/07/2011  . Bladder wall thickening 10/06/2011  . Iron deficiency anemia 10/02/2011  . Diabetes mellitus ([place]) 10/01/2011  . FX CLOSED FIBULA NOS 02/10/2008    Past Surgical History:  Procedure Laterality Date  . AV FISTULA PLACEMENT Left 07/06/2015   Procedure:  INSERTION LEFT ARM ARTERIOVENOUS GORTEX GRAFT;  Surgeon: Angelia Mould, MD;  Location: [place];  Service: Vascular;  Laterality: Left;  . AV FISTULA PLACEMENT Right 02/26/2016   Procedure: ARTERIOVENOUS (AV) FISTULA CREATION ;  Surgeon: Angelia Mould, MD;  Location: [place];  Service: Vascular;  Laterality: Right;  .  [place] REMOVAL Left 10/09/2015   Procedure: REMOVAL OF ARTERIOVENOUS GORETEX GRAFT ([place]) Evacuation of Lymphocele, Vein Patch angioplasty of brachial artery.;  Surgeon: Angelia Mould, MD;  Location: [place];  Service: Vascular;  Laterality: Left;  . BASCILIC VEIN TRANSPOSITION Right 02/26/2016   Procedure: Right BASCILIC VEIN TRANSPOSITION;  Surgeon: Angelia Mould, MD;  Location: [place];  Service: Vascular;  Laterality: Right;  . CIRCUMCISION N/A 01/04/2014   Procedure: CIRCUMCISION ADULT (procedure #1);  Surgeon: Marissa Nestle, MD;  Location: [facility];   Service: Urology;  Laterality: N/A;  . COLECTOMY N/A 05/04/2017   Procedure: TOTAL COLECTOMY;  Surgeon: Aviva Signs, MD;  Location: [facility];  Service: General;  Laterality: N/A;  . COLONOSCOPY N/A 04/27/2017   Procedure: COLONOSCOPY;  Surgeon: Daneil Dolin, MD;  [facility];  Service: Endoscopy;  Laterality: N/A;  245  . CYSTOSCOPY W/ RETROGRADES Bilateral 06/29/2015   Procedure: CYSTOSCOPY, DILATION OF URETHRAL STRICTURE WITH BILATERAL RETROGRADE PYELOGRAM,SUPRAPUBIC TUBE CHANGE;  Surgeon: Festus Aloe, MD;  Location: [facility];  Service: Urology;  Laterality: Bilateral;  . CYSTOSCOPY WITH URETHRAL DILATATION N/A 12/29/2013   Procedure: CYSTOSCOPY WITH URETHRAL DILATATION;  Surgeon: Marissa Nestle, MD;  Location: [facility];  Service: Urology;  Laterality: N/A;  . ESOPHAGOGASTRODUODENOSCOPY N/A 04/27/2017   Procedure: ESOPHAGOGASTRODUODENOSCOPY (EGD);  Surgeon: Daneil Dolin, MD;  [facility];  Service: Endoscopy;  Laterality: N/A;  . ORIF FEMUR FRACTURE Right 11/22/2016   Procedure: OPEN REDUCTION INTERNAL FIXATION (ORIF) DISTAL FEMUR FRACTURE;  Surgeon: Rod Can, MD;  Location: [place];  Service: Orthopedics;  Laterality: Right;  . PATCH ANGIOPLASTY Right 12/10/2017   Procedure: PATCH ANGIOPLASTY;  Surgeon: Angelia Mould, MD;  Location: [facility];  Service: Vascular;  Laterality: Right;  . PERIPHERAL VASCULAR CATHETERIZATION N/A 10/08/2015   Procedure: A/V Shuntogram;  Surgeon: Angelia Mould, MD;  Location: [place] CV LAB;  Service: Cardiovascular;  Laterality: N/A;  . THROMBECTOMY W/ EMBOLECTOMY Right 12/10/2017   Procedure: THROMBECTOMY REVISION RIGHT ARM  ARTERIOVENOUS FISTULA;  Surgeon: Angelia Mould, MD;  Location: [place];  Service: Vascular;  Laterality: Right;  . TRANSURETHRAL RESECTION OF PROSTATE N/A 01/04/2014   Procedure: TRANSURETHRAL RESECTION OF THE PROSTATE (TURP) (procedure #2);  Surgeon: Marissa Nestle, MD;  Location: [facility];   Service: Urology;  Laterality: N/A;        Home Medications    Prior to Admission medications   Medication Sig Start Date End Date Taking? Authorizing Provider  atorvastatin (LIPITOR) 40 MG tablet Take 40 mg by mouth every evening.   Yes [provider]  insulin glargine (LANTUS) 100 UNIT/ML injection Inject 5 Units into the skin at bedtime.   Yes [provider]  levothyroxine (SYNTHROID, LEVOTHROID) 75 MCG tablet Take 75 mcg by mouth daily before breakfast.    Yes [provider]  linagliptin (TRADJENTA) 5 MG TABS tablet Take 5 mg by mouth every evening.    Yes [provider]  midodrine (PROAMATINE) 10 MG tablet Take 10 mg by mouth. Send medication with patient on Tue.Thu., Sat.,to dialysis do not give at [facility] give to take with him   Yes [provider]  Multiple Vitamin (MULTIVITAMIN WITH MINERALS) TABS tablet Take 1 tablet by mouth every evening.    Yes [provider]  Nutritional Supplements (NEPRO/CARBSTEADY PO) Take 1 Can by mouth 2 (two) times daily.   Yes [provider]  omeprazole (PRILOSEC) 40 MG capsule Take 40 mg  by mouth at bedtime.    Yes [provider]  sevelamer carbonate (RENVELA) 800 MG tablet Take 3,200 mg by mouth 3 (three) times daily with meals.    Yes [provider]  tamsulosin (FLOMAX) 0.4 MG CAPS capsule Take 0.4 mg by mouth every evening. Give 30 minutes after a meal   Yes [provider]  torsemide (DEMADEX) 10 MG tablet Take 5 tablets (50 mg total) by mouth daily. 04/07/16  Yes [name], MD  Neo-Bacit-Poly-Lidocaine (FIRST AID PLUS LIDOCAINE EX) Apply 1 application topically Every Tuesday,Thursday,and Saturday with dialysis. Prior to dialysis    [provider]    Family History Family History  Problem Relation Age of Onset  . Cancer Mother   . Colon cancer Neg Hx     Social History Social History   Tobacco Use  . Smoking status: Never  Smoker  . Smokeless tobacco: Never Used  Substance Use Topics  . Alcohol use: No    Alcohol/week: 0.0 oz    Comment: occ. use   . Drug use: No     Allergies   Patient has no known allergies.   Review of Systems Review of Systems  Unable to perform ROS: Mental status change     Physical Exam Updated Vital Signs BP 135/74   Pulse (!) 115   Temp (!) 100.8 F (38.2 C) (Rectal)   Resp (!) 24   Ht 1.778 m (5\' 10" )   Wt 94.3 kg (208 lb)   SpO2 98%   BMI 29.84 kg/m   Physical Exam  Constitutional: He appears well-developed and well-nourished. No distress.  HENT:  Head: Normocephalic and atraumatic.  Mouth/Throat: Oropharynx is clear and moist.  Eyes: Pupils are equal, round, and reactive to light. Conjunctivae and EOM are normal.  Neck: Neck supple.  Cardiovascular: Normal rate, regular rhythm and normal heart sounds.  Pulmonary/Chest: Effort normal and breath sounds normal. No respiratory distress.  Abdominal: Soft. Bowel sounds are normal. There is tenderness.  Questionable diffuse tenderness but no guarding  Musculoskeletal: He exhibits no edema.  AV fistula right arm with good pulse and thrill.  Neurological: He is alert.  Patient seems to have some confusion.  Not certain what day he gets dialysis.  Not certain about the vomiting or abdominal pain.  Skin: Skin is warm.  Nursing note and vitals reviewed.    ED Treatments / Results  Labs (all labs ordered are listed, but only abnormal results are displayed) Labs Reviewed  CULTURE, BLOOD (ROUTINE X 2)  CULTURE, BLOOD (ROUTINE X 2)  COMPREHENSIVE METABOLIC PANEL  CBC WITH DIFFERENTIAL/PLATELET  I-STAT CG4 LACTIC ACID, ED    EKG None   ED ECG REPORT   Date: 01/19/2018  Rate: 123  Rhythm: sinus tachycardia  QRS Axis: normal  Intervals: normal  ST/T Wave abnormalities: ST depressions diffusely  Conduction Disutrbances:none  Narrative Interpretation:   Old EKG Reviewed: none available  I have  personally reviewed the EKG tracing and agree with the computerized printout as noted.   Radiology No results found.  Procedures Procedures (including critical care time)  CRITICAL CARE Performed by: Leyanna Bittman Total critical care time: 30 minutes Critical care time was exclusive of separately billable procedures and treating other patients. Critical care was necessary to treat or prevent imminent or life-threatening deterioration. Critical care was time spent personally by me on the following activities: development of treatment plan with patient and/or surrogate as well as nursing, discussions with consultants, evaluation of patient's response to  treatment, examination of patient, obtaining history from patient or surrogate, ordering and performing treatments and interventions, ordering and review of laboratory studies, ordering and review of radiographic studies, pulse oximetry and re-evaluation of patient's condition.   Medications Ordered in ED Medications  piperacillin-tazobactam (ZOSYN) IVPB 3.375 g (has no administration in time range)  vancomycin (VANCOCIN) 2,000 mg in sodium chloride 0.9 % 500 mL IVPB (has no administration in time range)     Initial Impression / Assessment and Plan / ED Course  I have reviewed the triage vital signs and the nursing notes.  Pertinent labs & imaging results that were available during my care of the patient were reviewed by me and considered in my medical decision making (see chart for details).    Patient with tachycardia fever some increased respiratory rate patient started on sepsis protocol based on this.  Patient not hypotensive.  Patient is a dialysis patient.  Normally dialyzed Tuesday Thursdays and Saturdays.  Not dialyzed today.  Patient not a good historian.  Ordered sepsis protocol including broad-spectrum antibiotics.  IV access has been a problem.  Requested PICC line insertion.  Patient will most likely require admission.  CT  scan of abdomen being done along with the sepsis workup.  On exam patient seemed to have some diffuse generalized abdominal tenderness.  Patient is alert but does seem to have some confusion not clear whether this is baseline or not.   Final Clinical Impressions(s) / ED Diagnoses   Final diagnoses:  None    ED Discharge Orders    None       Fredia Sorrow, MD 01/19/18 1615    Fredia Sorrow, MD 01/19/18 1616

## 2018-01-20 ENCOUNTER — Other Ambulatory Visit: Payer: Self-pay

## 2018-01-20 DIAGNOSIS — E039 Hypothyroidism, unspecified: Secondary | ICD-10-CM

## 2018-01-20 DIAGNOSIS — R509 Fever, unspecified: Secondary | ICD-10-CM

## 2018-01-20 DIAGNOSIS — N186 End stage renal disease: Secondary | ICD-10-CM

## 2018-01-20 DIAGNOSIS — K529 Noninfective gastroenteritis and colitis, unspecified: Secondary | ICD-10-CM

## 2018-01-20 LAB — BASIC METABOLIC PANEL
Anion gap: 18 — ABNORMAL HIGH (ref 5–15)
BUN: 93 mg/dL — ABNORMAL HIGH (ref 6–20)
CO2: 19 mmol/L — ABNORMAL LOW (ref 22–32)
Calcium: 8 mg/dL — ABNORMAL LOW (ref 8.9–10.3)
Chloride: 102 mmol/L (ref 101–111)
Creatinine, Ser: 11.08 mg/dL — ABNORMAL HIGH (ref 0.61–1.24)
GFR calc Af Amer: 5 mL/min — ABNORMAL LOW (ref >60)
GFR calc non Af Amer: 4 mL/min — ABNORMAL LOW (ref >60)
Glucose, Bld: 126 mg/dL — ABNORMAL HIGH (ref 65–99)
Potassium: 4.2 mmol/L (ref 3.5–5.1)
Sodium: 139 mmol/L (ref 135–145)

## 2018-01-20 LAB — GLUCOSE, CAPILLARY
Glucose-Capillary: 133 mg/dL — ABNORMAL HIGH (ref 65–99)
Glucose-Capillary: 112 mg/dL — ABNORMAL HIGH (ref 65–99)
Glucose-Capillary: 117 mg/dL — ABNORMAL HIGH (ref 65–99)
Glucose-Capillary: 121 mg/dL — ABNORMAL HIGH (ref 65–99)
Glucose-Capillary: 102 mg/dL — ABNORMAL HIGH (ref 65–99)
Glucose-Capillary: 92 mg/dL (ref 65–99)

## 2018-01-20 LAB — CBC
HCT: 33.7 % — ABNORMAL LOW (ref 39.0–52.0)
Hemoglobin: 10.8 g/dL — ABNORMAL LOW (ref 13.0–17.0)
MCH: 34.1 pg — ABNORMAL HIGH (ref 26.0–34.0)
MCHC: 32 g/dL (ref 30.0–36.0)
MCV: 106.3 fL — ABNORMAL HIGH (ref 78.0–100.0)
Platelets: 137 10*3/uL — ABNORMAL LOW (ref 150–400)
RBC: 3.17 MIL/uL — ABNORMAL LOW (ref 4.22–5.81)
RDW: 13.5 % (ref 11.5–15.5)
WBC: 7.9 10*3/uL (ref 4.0–10.5)

## 2018-01-20 LAB — BLOOD CULTURE ID PANEL (REFLEXED)

## 2018-01-20 LAB — HEPATITIS B SURFACE ANTIGEN: Hepatitis B Surface Ag: NEGATIVE

## 2018-01-20 LAB — LACTIC ACID, PLASMA: Lactic Acid, Venous: 0.9 mmol/L (ref 0.5–1.9)

## 2018-01-20 LAB — MRSA PCR SCREENING: MRSA by PCR: POSITIVE — AB

## 2018-01-20 MED ORDER — VANCOMYCIN HCL IN DEXTROSE 1-5 GM/200ML-% IV SOLN
1000.0000 mg | Freq: Once | INTRAVENOUS | Status: AC
  Administered 2018-01-20: 1000 mg via INTRAVENOUS
  Filled 2018-01-20: qty 200

## 2018-01-20 MED ORDER — PENTAFLUOROPROP-TETRAFLUOROETH EX AERO: 1.0000 "application " | INHALATION_SPRAY | CUTANEOUS | Status: DC | PRN

## 2018-01-20 MED ORDER — LIDOCAINE HCL (PF) 1 % IJ SOLN: 5.0000 mL | INTRAMUSCULAR | Status: DC | PRN

## 2018-01-20 MED ORDER — EPOETIN ALFA 2000 UNIT/ML IJ SOLN
2000.0000 [IU] | INTRAMUSCULAR | Status: DC
  Administered 2018-01-22: 2000 [IU] via INTRAVENOUS
  Filled 2018-01-20 (×3): qty 1

## 2018-01-20 MED ORDER — SODIUM CHLORIDE 0.9 % IV SOLN: 100.0000 mL | INTRAVENOUS | Status: DC | PRN

## 2018-01-20 MED ORDER — VANCOMYCIN HCL IN DEXTROSE 1-5 GM/200ML-% IV SOLN: 1000.0000 mg | INTRAVENOUS | Status: DC

## 2018-01-20 MED ORDER — VANCOMYCIN HCL IN DEXTROSE 1-5 GM/200ML-% IV SOLN: 1000.0000 mg | Freq: Once | INTRAVENOUS | Status: DC

## 2018-01-20 MED ORDER — EPOETIN ALFA 2000 UNIT/ML IJ SOLN
2000.0000 [IU] | INTRAMUSCULAR | Status: DC
  Filled 2018-01-20 (×2): qty 1

## 2018-01-20 MED ORDER — INSULIN ASPART 100 UNIT/ML ~~LOC~~ SOLN
0.0000 [IU] | Freq: Three times a day (TID) | SUBCUTANEOUS | Status: DC
  Administered 2018-01-21 – 2018-01-22 (×3): 2 [IU] via SUBCUTANEOUS

## 2018-01-20 MED ORDER — HEPARIN SODIUM (PORCINE) 1000 UNIT/ML IJ SOLN
INTRAMUSCULAR | Status: AC
  Administered 2018-01-20: 1800 [IU] via INTRAVENOUS_CENTRAL
  Filled 2018-01-20: qty 2

## 2018-01-20 MED ORDER — EPOETIN ALFA 10000 UNIT/ML IJ SOLN
INTRAMUSCULAR | Status: AC
  Administered 2018-01-20: 2000 [IU]
  Filled 2018-01-20: qty 1

## 2018-01-20 MED ORDER — HEPARIN SODIUM (PORCINE) 1000 UNIT/ML DIALYSIS
20.0000 [IU]/kg | INTRAMUSCULAR | Status: DC | PRN
  Administered 2018-01-20: 1800 [IU] via INTRAVENOUS_CENTRAL
  Filled 2018-01-20 (×2): qty 2

## 2018-01-20 MED ORDER — LIDOCAINE-PRILOCAINE 2.5-2.5 % EX CREA: 1.0000 "application " | TOPICAL_CREAM | CUTANEOUS | Status: DC | PRN

## 2018-01-20 NOTE — Progress Notes (Signed)
[facility] Progress Note  Subjective: feeling better, wants solid food, no abd pain or nausea now.  Having loose stool x 1-2   Vitals:   01/19/18 2247 01/20/18 0001 01/20/18 0449 01/20/18 1115  BP: (!) 108/54 (!) 103/50 114/63   Pulse: 69 (!) 110 (!) 108   Resp: 15     Temp:  (!) 100.5 F (38.1 C) 99.6 F (37.6 C)   TempSrc:  Oral Oral   SpO2: 99% 96% 100% 98%  Weight:  93.3 kg (205 lb 11 oz) 92 kg (202 lb 13.2 oz)   Height:        Inpatient medications: . epoetin (EPOGEN/PROCRIT) injection  2,000 Units Intravenous Q M,W,F-HD  . feeding supplement (NEPRO CARB STEADY)  237 mL Oral BID BM  . heparin  5,000 Units Subcutaneous Q8H  . insulin aspart  0-15 Units Subcutaneous Q4H  . levothyroxine  37.5 mcg Intravenous QAC breakfast  . multivitamin with minerals  1 tablet Oral QPM  . pantoprazole  40 mg Oral Daily  . sevelamer carbonate  3,200 mg Oral TID WC  . sodium chloride flush  3 mL Intravenous Q12H  . tamsulosin  0.4 mg Oral QPM   . sodium chloride    . sodium chloride    . sodium chloride    . piperacillin-tazobactam (ZOSYN)  IV Stopped (01/20/18 1040)  . [START ON 01/21/2018] vancomycin     sodium chloride, sodium chloride, sodium chloride, acetaminophen **OR** acetaminophen, heparin, lidocaine (PF), lidocaine-prilocaine, midodrine, ondansetron **OR** ondansetron (ZOFRAN) IV, pentafluoroprop-tetrafluoroeth, sodium chloride flush  Exam: Eyes: lids and conjunctivae normal ENMT: Mucous membranes are moist.  Neck: normal, supple Respiratory: clear to auscultation bilaterally. Normal respiratory effort. No accessory muscle use.  Cardiovascular: Regular rate and rhythm, no murmurs. No extremity edema. Abdomen: no tenderness, no distention. Bowel sounds positive.  Musculoskeletal:  No joint deformity upper and lower extremities.   Skin: no rashes, lesions, ulcers.  Psychiatric: Normal judgment and insight. Alert and oriented x 3. Normal mood.      Brief Summary:  Zachary [name] is a 72 y.o. male w PMH of ESRD on HD TTS, DM2, BPH, HL, anemia presented with  vomiting and abdominal pain in the epigastric region that began last night.  As a result of his symptoms, he did not go to hemodialysis and could not maintain adequate oral intake.  The patient was a very poor historian as he is known to have mental retardation. According to the ED physician 2 brothers were at the bedside earlier, but they did not know much about his situation either.  It appears that he was seen by Dr. Lyndel Safe for a primary care visit just 1 day prior with plans to start low-dose Lantus on account of poor blood glucose readings.  None of the symptoms appear to have been present during the visit yest.    ED Course: Vital signs showed sinus tach, fever 100.8 deg, stable BP's, stable SpO2 on RA. Glu 208, no ^WBC, stable anemia and ^creat levels c/w esrd. Lactic 2.5 in ED. and fever with temperature up to 100.8 Fahrenheit.  CT of abdomen and pelvis with contrast demonstrated findings consistent with enteritis of the small bowel.  Also shows bilateral hydroureter, but no finding of obstruction and a 14 mm pancreatic head lesion.  Chest x-ray with no acute findings aside from low lung volumes.         Impression/Plan: Principal Problem:   Sepsis ([place]) Active Problems:   Anemia, chronic disease  Hyperlipidemia   ESRD (end stage renal disease) ([place])   Hypothyroidism   Enteritis   Impression/ Plan: 1. Abd pain/ vomiting/ diarrhea/ fever:  likely secondary to gastroenteritis. Blood cx's neg. Send stool studies for PCR and Cdif. DC IV abx for sepsis, pt stalbe.  Has chronic hypotension on midodrine. Pt wants solid food , will advance to renal / carb mod diet.  2. ESRD: on HD TTS. Seen by renal team, plan HD today.  3. Pancreatic head lesion-14 mm.  Suggest follow-up CT in 2 years in the outpatient setting. 4. Type 2 diabetes with hyperglycemia.  Maintain on sliding scale insulin.  He was just  started on low-dose Lantus yesterday which will avoid for now.  Noted to have blood glucose consistently above 200-250mg /dL range. Here BS's are stable. Cont to hold oral agents. 5. Dyslipidemia.  Withhold statin on account of symptoms currently. 6. Hypothyroidism.  Continue Synthroid in IV form on account of current symptoms. 7. BPH.  Continue on Flomax. 8. Iron deficiency anemia-stable.  Continue on Nepro.   DVT prophylaxis: Heparin Code Status: Full Family Communication: None at bedside Disposition Plan: when GI symptoms resolving , pt eating, etc. Needs inpatient hemodialysis. Consults called: Nephrology has seen Admission status: Inpatient, telemetry   Kelly Splinter MD [facility] pgr [phone] 01/20/2018, 12:42 PM   Recent Labs  Lab 01/19/18 0700 01/19/18 1608 01/20/18 0628  NA 137 135 139  K 4.5 5.1 4.2  CL 99* 99* 102  CO2 20* 20* 19*  GLUCOSE 217* 208* 126*  BUN 81* 88* 93*  CREATININE 10.26* 10.85* 11.08*  CALCIUM 8.4* 7.8* 8.0*   Recent Labs  Lab 01/19/18 1608  AST 22  ALT 18  ALKPHOS 152*  BILITOT 0.8  PROT 6.8  ALBUMIN 3.3*   Recent Labs  Lab 01/19/18 0700 01/19/18 1608 01/20/18 0628  WBC 8.9 7.4 7.9  NEUTROABS 6.8 5.3  --   HGB 11.6* 11.0* 10.8*  HCT 36.3* 34.8* 33.7*  MCV 106.1* 106.4* 106.3*  PLT 145* 143* 137*   Iron/TIBC/Ferritin/ %Sat    Component Value Date/Time   IRON 34 (L) 05/01/2017 0812   TIBC 231 (L) 05/01/2017 0812   FERRITIN 85 05/01/2017 0812   IRONPCTSAT 15 (L) 05/01/2017 2162

## 2018-01-20 NOTE — Procedures (Signed)
    HEMODIALYSIS TREATMENT NOTE:   4 hour heparin-free dialysis completed via right upper arm AVF (15g/antegrade). Goal met: 1.5L removed.  No interruption in ultrafiltration.  All blood returned.  Hemostasis achieved in 10 minutes.   Rockwell [place], RN,CDN

## 2018-01-20 NOTE — Progress Notes (Signed)
CRITICAL VALUE ALERT  Critical Value:  Blood culture - gram positive cocci in aerobic bottle  Date & Time Notied:  01/20/2018 1600  Provider Notified: Dr. Jonnie Finner  Orders Received/Actions taken: MD is restarting patient on vancomycin

## 2018-01-20 NOTE — Consult Note (Signed)
Zachary [name] MRN: [id number] DOB/AGE: 1946-06-26 72 y.o. Primary Care Physician:Fanta, Tesfaye, MD Admit date: 01/19/2018 Chief Complaint:  Chief Complaint  Patient presents with  . Emesis   HPI: Pt is a 72 year old  African [place]  male with past  medical history significant of colectomy, mental retardation, DM, ESRD on dialysis who was brought to ER with c/o emesis.  HPI dates back to 01/18/18 when staff at [facility] noticed that pt was having emesis and sent him to ER Upon evaluation in ER pt was found to have abdominal pain, ever and tachycardia. Pt has CT abdomen done which showed  findings consistent with enteritis of the small bowel with bilateral hydroureter, but no finding of obstruction and a 14 mm pancreatic head lesion. Pt was admitted with possible sepsis for further tx. Pt history remains limited as patient is poor historian.  Pt only complaints of   pain in the upper abdomen.  Pt is unable to qualify it any further. Pt missed his dialysis on Tuesday (yesterday) .   Past Medical History:  Diagnosis Date  . Abnormal CT scan, kidney 10/06/2011  . Acute pyelonephritis 10/07/2011  . Anemia    normocytic  . Anxiety    mental retardation  . Bladder wall thickening 10/06/2011  . BPH (benign prostatic hypertrophy)   . Diabetes mellitus   . Dialysis patient [facility])    Tuesday, Thursday and Saturday,   . Edema     history of lower extremity edema  . GERD (gastroesophageal reflux disease)   . Heme positive stool   . Hydronephrosis   . Hyperkalemia   . Hyperlipidemia   . Hypernatremia   . Hypertension   . Hypothyroidism   . Impaired speech   . 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 MR (mental retardation)   . Muscle weakness   . Obstructive uropathy   . 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 Poor historian poor historian  . Protein calorie malnutrition ([place])   . Pyelonephritis   . Renal failure (ARF), acute on chronic (HCC)   . Renal insufficiency    chronic  history  . Sepsis ([place])   . Smoking   . Uremia   . Urinary retention   . UTI (lower urinary tract infection) 10/06/2011        Family History  Problem Relation Age of Onset  . Cancer Mother   . Colon cancer Neg Hx     Social History:  reports that he has never smoked. He has never used smokeless tobacco. He reports that he does not drink alcohol or use drugs.   Allergies: No Known Allergies  Medications Prior to Admission  Medication Sig Dispense Refill  . atorvastatin (LIPITOR) 40 MG tablet Take 40 mg by mouth every evening.    . insulin glargine (LANTUS) 100 UNIT/ML injection Inject 5 Units into the skin at bedtime.    Marland Kitchen levothyroxine (SYNTHROID, LEVOTHROID) 75 MCG tablet Take 75 mcg by mouth daily before breakfast.     . linagliptin (TRADJENTA) 5 MG TABS tablet Take 5 mg by mouth every evening.     . midodrine (PROAMATINE) 10 MG tablet Take 10 mg by mouth. Send medication with patient on Tue.Thu., Sat.,to dialysis do not give at [facility] give to take with him    . Multiple Vitamin (MULTIVITAMIN WITH MINERALS) TABS tablet Take 1 tablet by mouth every evening.     . Nutritional Supplements (NEPRO/CARBSTEADY PO) Take 1 Can by mouth 2 (two) times  daily.    . omeprazole (PRILOSEC) 40 MG capsule Take 40 mg by mouth at bedtime.     . sevelamer carbonate (RENVELA) 800 MG tablet Take 3,200 mg by mouth 3 (three) times daily with meals.     . tamsulosin (FLOMAX) 0.4 MG CAPS capsule Take 0.4 mg by mouth every evening. Give 30 minutes after a meal    . torsemide (DEMADEX) 10 MG tablet Take 5 tablets (50 mg total) by mouth daily. 30 tablet 3  . Neo-Bacit-Poly-Lidocaine (FIRST AID PLUS LIDOCAINE EX) Apply 1 application topically Every Tuesday,Thursday,and Saturday with dialysis. Prior to dialysis         QAS:TMHDQ from the symptoms mentioned above,there are no other symptoms referable to all systems reviewed.  . feeding supplement (NEPRO CARB STEADY)  237 mL Oral BID BM  . heparin   5,000 Units Subcutaneous Q8H  . insulin aspart  0-15 Units Subcutaneous Q4H  . levothyroxine  37.5 mcg Intravenous QAC breakfast  . multivitamin with minerals  1 tablet Oral QPM  . pantoprazole  40 mg Oral Daily  . sevelamer carbonate  3,200 mg Oral TID WC  . sodium chloride flush  3 mL Intravenous Q12H  . tamsulosin  0.4 mg Oral QPM        Physical Exam: Vital signs in last 24 hours: Temp:  [99.6 F (37.6 C)-100.8 F (38.2 C)] 99.6 F (37.6 C) (03/27 0449) Pulse Rate:  [69-120] 108 (03/27 0449) Resp:  [11-25] 15 (03/26 2247) BP: (103-139)/(44-80) 114/63 (03/27 0449) SpO2:  [94 %-100 %] 100 % (03/27 0449) Weight:  [202 lb 13.2 oz (92 kg)-205 lb 11 oz (93.3 kg)] 202 lb 13.2 oz (92 kg) (03/27 0449) Weight change:  Last BM Date: 01/19/18  Intake/Output from previous day: 03/26 0701 - 03/27 0700 In: -  Out: 250 [Urine:250] No intake/output data recorded.   Physical Exam: General- pt is awake,alert, follows commands. Resp- No acute REsp distress,  NO Rhonchi CVS- S1S2 regular in  Rhythm but tachy GIT- BS+, soft, Distended. Surgical scar well healed EXT- NO LE Edema, NO Cyanosis CNS- CN 2-12 grossly intact. Moving all 4 extremities Psych- normal mood and flat affect Access-  AVF   Lab Results: CBC Recent Labs    01/19/18 1608 01/20/18 0628  WBC 7.4 7.9  HGB 11.0* 10.8*  HCT 34.8* 33.7*  PLT 143* 137*    BMET Recent Labs    01/19/18 1608 01/20/18 0628  NA 135 139  K 5.1 4.2  CL 99* 102  CO2 20* 19*  GLUCOSE 208* 126*  BUN 88* 93*  CREATININE 10.85* 11.08*  CALCIUM 7.8* 8.0*    MICRO Recent Results (from the past 240 hour(s))  Blood Culture (routine x 2)     Status: None (Preliminary result)   Collection Time: 01/19/18  4:13 PM  Result Value Ref Range Status   Specimen Description BLOOD RIGHT ANTECUBITAL  Final   Special Requests   Final    BOTTLES DRAWN AEROBIC ONLY Blood Culture adequate volume   Culture   Final    NO GROWTH < 24  HOURS Performed at [facility], [address], [place], [place] [address]    Report Status PENDING  Incomplete  Blood Culture (routine x 2)     Status: None (Preliminary result)   Collection Time: 01/19/18  4:13 PM  Result Value Ref Range Status   Specimen Description BLOOD RIGHT ANTECUBITAL  Final   Special Requests Blood Culture adequate volume  Final  Culture   Final    NO GROWTH < 24 HOURS Performed at [facility], [address], [place], [place] [address]    Report Status PENDING  Incomplete  MRSA PCR Screening     Status: Abnormal   Collection Time: 01/20/18  1:34 AM  Result Value Ref Range Status   MRSA by PCR POSITIVE (A) NEGATIVE Final    Comment:        The GeneXpert MRSA Assay (FDA approved for NASAL specimens only), is one component of a comprehensive MRSA colonization surveillance program. It is not intended to diagnose MRSA infection nor to guide or monitor treatment for MRSA infections. RESULT CALLED TO, READ BACK BY AND VERIFIED WITH: WORKMAN K. AT 0740A ON [id number] BY THOMPSON S. Performed at [facility], [address], [place], [place] [address]       Lab Results  Component Value Date   PTH 61 10/09/2015   CALCIUM 8.0 (L) 01/20/2018   CAION 1.24 10/08/2015   PHOS 3.7 06/10/2017    CX don yesterday Low lung volumes.  No acute cardiopulmonary disease.   Impression: 1)Renal  ESRD on HD                Pt is on TTS schedule as outpt                Pt was not  dialyzed yesterday.                will dialyze today  2)HTN  BP stable  3)Anemia IN ESRD the goal for HGB is 9-11. hgb  at goal Will keep on EPO during hd.    4)CKD Mineral-Bone Disorder Calcium when corrected for low albumin is at goal Phosphorus now  at goal.    5)CNS-Hx of mental Retardation  Primary MD following  6)Electrolytes  normokalemic  normonatremia    7)Acid base Co2  not  at goal. sec to ESRD  8) GI-admitted with enteritis Hx of  total  colectomy Clinically better   9) ID- admitted with fever/possible sepsis On Broad spectrum abx    Plan:  will dialyze toay Will use 3k bath Will keep on low dose epo    Durene Dodge S 01/20/2018, 9:34 AM

## 2018-01-21 DIAGNOSIS — R5081 Fever presenting with conditions classified elsewhere: Secondary | ICD-10-CM

## 2018-01-21 LAB — GLUCOSE, CAPILLARY
Glucose-Capillary: 129 mg/dL — ABNORMAL HIGH (ref 65–99)
Glucose-Capillary: 126 mg/dL — ABNORMAL HIGH (ref 65–99)
Glucose-Capillary: 142 mg/dL — ABNORMAL HIGH (ref 65–99)
Glucose-Capillary: 138 mg/dL — ABNORMAL HIGH (ref 65–99)

## 2018-01-21 LAB — BASIC METABOLIC PANEL
Anion gap: 20 — ABNORMAL HIGH (ref 5–15)
BUN: 44 mg/dL — ABNORMAL HIGH (ref 6–20)
CO2: 22 mmol/L (ref 22–32)
Calcium: 8.3 mg/dL — ABNORMAL LOW (ref 8.9–10.3)
Chloride: 94 mmol/L — ABNORMAL LOW (ref 101–111)
Creatinine, Ser: 7.58 mg/dL — ABNORMAL HIGH (ref 0.61–1.24)
GFR calc Af Amer: 7 mL/min — ABNORMAL LOW (ref >60)
GFR calc non Af Amer: 6 mL/min — ABNORMAL LOW (ref >60)
Glucose, Bld: 125 mg/dL — ABNORMAL HIGH (ref 65–99)
Potassium: 3.7 mmol/L (ref 3.5–5.1)
Sodium: 136 mmol/L (ref 135–145)

## 2018-01-21 LAB — C DIFFICILE QUICK SCREEN W PCR REFLEX
C Diff antigen: NEGATIVE
C Diff interpretation: NOT DETECTED
C Diff toxin: NEGATIVE

## 2018-01-21 MED ORDER — CHLORHEXIDINE GLUCONATE CLOTH 2 % EX PADS
6.0000 | MEDICATED_PAD | Freq: Every day | CUTANEOUS | Status: DC
  Administered 2018-01-22: 6 via TOPICAL

## 2018-01-21 MED ORDER — HEPARIN SODIUM (PORCINE) 5000 UNIT/ML IJ SOLN
5000.0000 [IU] | Freq: Two times a day (BID) | INTRAMUSCULAR | Status: DC
  Administered 2018-01-21: 5000 [IU] via SUBCUTANEOUS
  Filled 2018-01-21 (×2): qty 1

## 2018-01-21 MED ORDER — MUPIROCIN 2 % EX OINT
1.0000 "application " | TOPICAL_OINTMENT | Freq: Two times a day (BID) | CUTANEOUS | Status: DC
  Administered 2018-01-21 – 2018-01-22 (×2): 1 via NASAL
  Filled 2018-01-21: qty 22

## 2018-01-21 NOTE — Progress Notes (Addendum)
[facility] Progress Note  Subjective: feeling better, doesn't like the food here, no nausea/ vomiting or diarrhea.  Had temp max 100.5 yest.  1/2 blood cx's were + for CNSS.    Vitals:   01/20/18 2000 01/20/18 2020 01/20/18 2116 01/21/18 0559  BP: 100/61 112/66 104/64 93/64  Pulse: (!) 108 (!) 102 (!) 111 (!) 110  Resp:  16    Temp:  99.4 F (37.4 C) 99.5 F (37.5 C) 99.5 F (37.5 C)  TempSrc:  Oral Oral Oral  SpO2:  99% 98% 99%  Weight:  90.5 kg (199 lb 8.3 oz)  90.5 kg (199 lb 8.3 oz)  Height:        Inpatient medications: . [START ON 01/22/2018] Chlorhexidine Gluconate Cloth  6 each Topical Q0600  . epoetin (EPOGEN/PROCRIT) injection  2,000 Units Intravenous Q M,W,F-HD  . feeding supplement (NEPRO CARB STEADY)  237 mL Oral BID BM  . heparin  5,000 Units Subcutaneous Q8H  . insulin aspart  0-15 Units Subcutaneous TID AC  . levothyroxine  37.5 mcg Intravenous QAC breakfast  . multivitamin with minerals  1 tablet Oral QPM  . mupirocin ointment  1 application Nasal BID  . pantoprazole  40 mg Oral Daily  . sevelamer carbonate  3,200 mg Oral TID WC  . sodium chloride flush  3 mL Intravenous Q12H  . tamsulosin  0.4 mg Oral QPM   . sodium chloride    . sodium chloride    . sodium chloride    . vancomycin     sodium chloride, sodium chloride, sodium chloride, acetaminophen **OR** acetaminophen, heparin, lidocaine (PF), lidocaine-prilocaine, midodrine, ondansetron **OR** ondansetron (ZOFRAN) IV, pentafluoroprop-tetrafluoroeth, sodium chloride flush  Exam: Eyes: lids and conjunctivae normal ENMT: Mucous membranes are moist.  Neck: normal, supple Respiratory: clear to auscultation bilaterally. Normal respiratory effort. Cardiovascular: Regular rate and rhythm, no murmurs. No extremity edema. Abdomen: no tenderness, no distention. Bowel sounds positive.  Musculoskeletal:  No joint deformity upper and lower extremities.   Skin: no rashes, lesions, ulcers.  Psychiatric:  Normal judgment and insight. Alert and oriented x 3. Normal mood. LUE AV HD access w/o any signs of infection, +bruit NF, Ox 3   Brief Summary: Zachary [name] is a 72 y.o. male w PMH of ESRD on HD TTS, DM2, BPH, HL, anemia presented with  vomiting and abdominal pain in the epigastric region that began last night.  As a result of his symptoms, he did not go to hemodialysis and could not maintain adequate oral intake.  The patient was a very poor historian as he is known to have mental retardation. According to the ED physician 2 brothers were at the bedside earlier, but they did not know much about his situation either.  It appears that he was seen by Dr. Lyndel Safe for a primary care visit just 1 day prior with plans to start low-dose Lantus on account of poor blood glucose readings.  None of the symptoms appear to have been present during the visit yest.    ED Course: Vital signs showed sinus tach, fever 100.8 deg, stable BP's, stable SpO2 on RA. Glu 208, no ^WBC, stable anemia and ^creat levels c/w esrd. Lactic 2.5 in ED. and fever with temperature up to 100.8 Fahrenheit.  CT of abdomen and pelvis with contrast demonstrated findings consistent with enteritis of the small bowel.  Also shows bilateral hydroureter, but no finding of obstruction and a 14 mm pancreatic head lesion.  Chest x-ray with no acute findings  aside from low lung volumes  Home meds: - demedex 50 mg qd / midodrine 10 mg pre HD TTS - lantus 5 units hs/ tradjenta 5 mg hs - lipitor/ synthroid 75 ug/ PPI/ renvela ac - mVI/ Nepro      Impression/Plan: Principal Problem:   Sepsis ([place]) Active Problems:   Anemia, chronic disease   Hyperlipidemia   ESRD (end stage renal disease) ([place])   Hypothyroidism   Enteritis   Impression/ Plan: 1. Abd pain/ vomiting/ diarrhea/ fever:  likely secondary to gastroenteritis.  Symptoms resolving, did not have any diarrhea for stool testing.  Change diet to regular.   2. 1/2 +blood cx's for coag neg  staph: not sure if contaminant or not. Will repeat today. Have d/w renal , will dc IV abx, doesn't look to have a source of infection at this time.  They can f/u in OP setting after dc as well.  3. ESRD: on HD TTS. Had HD yest and will not have today, HD Friday/ tomorrow 4. Pancreatic head lesion-14 mm.  Suggest follow-up CT in 2 years in the outpatient setting. 5. Type 2 diabetes with hyperglycemia; on tradjenta and lantus 5u hs (lantus just started).  SSI here, BS's are in good range.  6. Dyslipidemia: withhold statin on account of symptoms currently. 7. Hypothyroidism.  Continue Synthroid 8. BPH.  Continue on Flomax. 9. Iron deficiency anemia-stable.  Continue on Nepro. 10. Dispo - likely dc tomorrow after HD if remains stable   DVT prophylaxis: Heparin Code Status: Full Family Communication: None at bedside Disposition Plan: poss dc tomorrow after HD Consults called: Nephrology has seen Admission status: Inpatient, telemetry   Kelly Splinter MD [facility] pgr [phone] 01/21/2018, 3:36 PM   Recent Labs  Lab 01/19/18 1608 01/20/18 0628 01/21/18 0825  NA 135 139 136  K 5.1 4.2 3.7  CL 99* 102 94*  CO2 20* 19* 22  GLUCOSE 208* 126* 125*  BUN 88* 93* 44*  CREATININE 10.85* 11.08* 7.58*  CALCIUM 7.8* 8.0* 8.3*   Recent Labs  Lab 01/19/18 1608  AST 22  ALT 18  ALKPHOS 152*  BILITOT 0.8  PROT 6.8  ALBUMIN 3.3*   Recent Labs  Lab 01/19/18 0700 01/19/18 1608 01/20/18 0628  WBC 8.9 7.4 7.9  NEUTROABS 6.8 5.3  --   HGB 11.6* 11.0* 10.8*  HCT 36.3* 34.8* 33.7*  MCV 106.1* 106.4* 106.3*  PLT 145* 143* 137*   Iron/TIBC/Ferritin/ %Sat    Component Value Date/Time   IRON 34 (L) 05/01/2017 0812   TIBC 231 (L) 05/01/2017 0812   FERRITIN 85 05/01/2017 0812   IRONPCTSAT 15 (L) 05/01/2017 5003

## 2018-01-21 NOTE — Progress Notes (Signed)
Subjective: Interval History: has no complaint of diarrhea.  His nausea and vomiting has improved.  He denies also any abdominal pain..  Objective: Vital signs in last 24 hours: Temp:  [99.4 F (37.4 C)-99.5 F (37.5 C)] 99.5 F (37.5 C) (03/28 0559) Pulse Rate:  [102-116] 110 (03/28 0559) Resp:  [16-18] 16 (03/27 2020) BP: (93-125)/(51-66) 93/64 (03/28 0559) SpO2:  [98 %-99 %] 99 % (03/28 0559) Weight:  [90.5 kg (199 lb 8.3 oz)-92 kg (202 lb 13.2 oz)] 90.5 kg (199 lb 8.3 oz) (03/28 0559) Weight change: -2.348 kg (-5 lb 2.8 oz)  Intake/Output from previous day: 03/27 0701 - 03/28 0700 In: 0  Out: 1500  Intake/Output this shift: No intake/output data recorded.  General appearance: alert, cooperative and no distress Resp: clear to auscultation bilaterally Cardio: regular rate and rhythm Extremities: No edema  Lab Results: Recent Labs    01/19/18 1608 01/20/18 0628  WBC 7.4 7.9  HGB 11.0* 10.8*  HCT 34.8* 33.7*  PLT 143* 137*   BMET:  Recent Labs    01/20/18 0628 01/21/18 0825  NA 139 136  K 4.2 3.7  CL 102 94*  CO2 19* 22  GLUCOSE 126* 125*  BUN 93* 44*  CREATININE 11.08* 7.58*  CALCIUM 8.0* 8.3*   No results for input([name]): PTH in the last 72 hours. Iron Studies: No results for input([name]): IRON, TIBC, TRANSFERRIN, FERRITIN in the last 72 hours.  Studies/Results: Ct Abdomen Pelvis W Contrast  Result Date: 01/19/2018 CLINICAL DATA:  Nausea and vomiting for 1 day. Generalized abdominal pain. End-stage renal disease on dialysis. EXAM: CT ABDOMEN AND PELVIS WITH CONTRAST TECHNIQUE: Multidetector CT imaging of the abdomen and pelvis was performed using the standard protocol following bolus administration of intravenous contrast. CONTRAST:  134mL ISOVUE-300 IOPAMIDOL (ISOVUE-300) INJECTION 61% COMPARISON:  05/17/2017 FINDINGS: Lower chest: Minimal atelectasis in the lung bases. No pleural effusion. Coronary artery atherosclerosis. Hepatobiliary: There is a small area of  ill-defined subtle hypoattenuation in the left hepatic lobe (series 2, image 13) which is unchanged from 10/05/2011 and likely benign. The gallbladder is completely decompressed with high density material in its lumen which may reflect small stones. No significant pericholecystic inflammatory changes are seen. There is no biliary dilatation. Pancreas: 14 x 8 mm low-density lesion in the pancreatic head, not well seen on the prior noncontrast examination and new from 2012. No pancreatic ductal dilatation or acute inflammatory changes. Spleen: Unremarkable. Adrenals/Urinary Tract: Unremarkable adrenal glands. Mildly dilated right extrarenal pelvis. Mild diffuse dilatation of both ureters to the bladder without an obstructing stone. Slight dilatation of the left renal pelvis. Unchanged 1.6 cm left renal cyst with a punctate calcification along its inferior margin. Subcentimeter low-density lesions in both kidneys are too small to fully characterize. There is no significant excreted contrast in the renal collecting systems on delayed images. The bladder is diffusely thick walled. Stomach/Bowel: The stomach is within normal limits. Sequelae of partial colectomy are again identified with an ileocolic anastomosis in the pelvis. There is inflammatory stranding in the right lower quadrant associated with multiple small bowel loops demonstrating mild wall thickening. There is also mild wall thickening of several small bowel loops in the left mid and left lower abdomen. No dilated loops of bowel are seen to indicate obstruction. Vascular/Lymphatic: Abdominal aortic atherosclerosis without aneurysm. No enlarged lymph nodes. Reproductive: Enlarged prostate indenting the bladder base. Other: No significant ascites. Postsurgical changes in the abdominal wall with abdominal wall laxity. Mild subcutaneous fat stranding in the left  lower quadrant abdominal wall, nonspecific but may relate to medication injection. Musculoskeletal:  Progressive endplate irregularity at L5-S1 without disc space collapse, favored to be degenerative with disc bulging contributing to bilateral neural foraminal stenosis. No significant paravertebral inflammation to strongly suggest infection. IMPRESSION: 1. Wall thickening of multiple small bowel loops with inflammatory stranding in the right lower quadrant suggestive of an infectious or inflammatory enteritis. No bowel obstruction. 2. Mild bilateral hydroureter without obstructing stone. Diffuse bladder wall thickening, correlate for urinary tract infection. 3. 14 mm low-density/cystic pancreatic head lesion. Follow-up pancreatic protocol abdominal CT is recommended in 2 years. 4. Collapsed gallbladder possibly containing small stones. 5.  Aortic Atherosclerosis (ICD10-I70.0). Electronically Signed   By: Logan Bores M.D.   On: 01/19/2018 17:51   Dg Chest Port 1 View  Result Date: 01/19/2018 CLINICAL DATA:  Vomiting.  Fever. EXAM: PORTABLE CHEST 1 VIEW COMPARISON:  06/03/2017. FINDINGS: Mediastinum heart size appears stable. Low lung volumes. No focal infiltrate. No pleural effusion or pneumothorax. No acute bony abnormality. IMPRESSION: Low lung volumes.  No acute cardiopulmonary disease. Electronically Signed   By: Marcello Moores  Register   On: 01/19/2018 16:09    I have reviewed the patient'[name] current medications.  Assessment/Plan: 1] gastroenteritis: Presently patient is feeling better.  He does not have any nausea or vomiting today. 2] end-stage renal disease: Is status post hemodialysis yesterday.  His potassium is normal 3] anemia: His hemoglobin is within our target goal 4] MRSA infection: Patient is presently positive for MRSA.  Source of infection not clear.  Patient has bilateral hydroureter.  Patient was previous recurrent bilateral hydronephrosis before  hemodialysis was initiated. 5] hypertension: His blood pressure is reasonably controlled 6] fluid management: No sign of fluid overload.  We  are able to remove about 1.5 L yesterday. 7] bone and mineral disorder: His calcium is a range but phosphorus is not available. Plan: 1] patient does not need dialysis today 2] we will dialyze patient tomorrow 3] we will check his CBC and renal panel in the morning.   LOS: 2 days   Zachary [name] 01/21/2018,9:27 AM

## 2018-01-21 NOTE — Progress Notes (Signed)
Patient had positive blood cultures, Mid-level paged with results. Patient is already receiving Vancomycin.

## 2018-01-22 ENCOUNTER — Inpatient Hospital Stay
Admission: RE | Admit: 2018-01-22 | Discharge: 2018-08-13 | Disposition: A | Discharge status: Other Acute Inpatient Hospital | Payer: Medicare Other | Source: Ambulatory Visit | Attending: Internal Medicine | Admitting: Internal Medicine

## 2018-01-22 DIAGNOSIS — K219 Gastro-esophageal reflux disease without esophagitis: Secondary | ICD-10-CM | POA: Diagnosis not present

## 2018-01-22 DIAGNOSIS — D638 Anemia in other chronic diseases classified elsewhere: Secondary | ICD-10-CM

## 2018-01-22 DIAGNOSIS — Z9181 History of falling: Secondary | ICD-10-CM | POA: Diagnosis not present

## 2018-01-22 DIAGNOSIS — N186 End stage renal disease: Principal | ICD-10-CM

## 2018-01-22 DIAGNOSIS — D631 Anemia in chronic kidney disease: Secondary | ICD-10-CM | POA: Diagnosis not present

## 2018-01-22 DIAGNOSIS — Z9049 Acquired absence of other specified parts of digestive tract: Secondary | ICD-10-CM | POA: Diagnosis not present

## 2018-01-22 DIAGNOSIS — A419 Sepsis, unspecified organism: Secondary | ICD-10-CM

## 2018-01-22 DIAGNOSIS — R2689 Other abnormalities of gait and mobility: Secondary | ICD-10-CM | POA: Diagnosis not present

## 2018-01-22 DIAGNOSIS — E1129 Type 2 diabetes mellitus with other diabetic kidney complication: Secondary | ICD-10-CM | POA: Diagnosis not present

## 2018-01-22 DIAGNOSIS — K529 Noninfective gastroenteritis and colitis, unspecified: Secondary | ICD-10-CM | POA: Diagnosis not present

## 2018-01-22 DIAGNOSIS — Z992 Dependence on renal dialysis: Secondary | ICD-10-CM | POA: Diagnosis not present

## 2018-01-22 DIAGNOSIS — I10 Essential (primary) hypertension: Secondary | ICD-10-CM | POA: Diagnosis not present

## 2018-01-22 DIAGNOSIS — N3289 Other specified disorders of bladder: Secondary | ICD-10-CM | POA: Diagnosis not present

## 2018-01-22 DIAGNOSIS — N2581 Secondary hyperparathyroidism of renal origin: Secondary | ICD-10-CM | POA: Diagnosis not present

## 2018-01-22 DIAGNOSIS — E039 Hypothyroidism, unspecified: Secondary | ICD-10-CM | POA: Diagnosis not present

## 2018-01-22 DIAGNOSIS — E119 Type 2 diabetes mellitus without complications: Secondary | ICD-10-CM | POA: Diagnosis not present

## 2018-01-22 DIAGNOSIS — E785 Hyperlipidemia, unspecified: Secondary | ICD-10-CM | POA: Diagnosis not present

## 2018-01-22 DIAGNOSIS — D509 Iron deficiency anemia, unspecified: Secondary | ICD-10-CM | POA: Diagnosis not present

## 2018-01-22 DIAGNOSIS — M6281 Muscle weakness (generalized): Secondary | ICD-10-CM | POA: Diagnosis not present

## 2018-01-22 DIAGNOSIS — Z23 Encounter for immunization: Secondary | ICD-10-CM | POA: Diagnosis not present

## 2018-01-22 DIAGNOSIS — E1121 Type 2 diabetes mellitus with diabetic nephropathy: Secondary | ICD-10-CM | POA: Diagnosis not present

## 2018-01-22 DIAGNOSIS — E782 Mixed hyperlipidemia: Secondary | ICD-10-CM | POA: Diagnosis not present

## 2018-01-22 DIAGNOSIS — R279 Unspecified lack of coordination: Secondary | ICD-10-CM | POA: Diagnosis not present

## 2018-01-22 DIAGNOSIS — F70 Mild intellectual disabilities: Secondary | ICD-10-CM | POA: Diagnosis not present

## 2018-01-22 DIAGNOSIS — N401 Enlarged prostate with lower urinary tract symptoms: Secondary | ICD-10-CM | POA: Diagnosis not present

## 2018-01-22 DIAGNOSIS — N61 Mastitis without abscess: Secondary | ICD-10-CM

## 2018-01-22 DIAGNOSIS — D500 Iron deficiency anemia secondary to blood loss (chronic): Secondary | ICD-10-CM | POA: Diagnosis not present

## 2018-01-22 LAB — CULTURE, BLOOD (ROUTINE X 2): Special Requests: ADEQUATE

## 2018-01-22 LAB — CBC
HCT: 34.3 % — ABNORMAL LOW (ref 39.0–52.0)
Hemoglobin: 10.9 g/dL — ABNORMAL LOW (ref 13.0–17.0)
MCH: 34 pg (ref 26.0–34.0)
MCHC: 31.8 g/dL (ref 30.0–36.0)
MCV: 106.9 fL — ABNORMAL HIGH (ref 78.0–100.0)
Platelets: 153 10*3/uL (ref 150–400)
RBC: 3.21 MIL/uL — ABNORMAL LOW (ref 4.22–5.81)
RDW: 13.5 % (ref 11.5–15.5)
WBC: 6.2 10*3/uL (ref 4.0–10.5)

## 2018-01-22 LAB — RENAL FUNCTION PANEL
Albumin: 3.2 g/dL — ABNORMAL LOW (ref 3.5–5.0)
BUN: 61 mg/dL — ABNORMAL HIGH (ref 6–20)
CO2: 21 mmol/L — ABNORMAL LOW (ref 22–32)
Calcium: 8.2 mg/dL — ABNORMAL LOW (ref 8.9–10.3)
Chloride: 94 mmol/L — ABNORMAL LOW (ref 101–111)
Creatinine, Ser: 10.63 mg/dL — ABNORMAL HIGH (ref 0.61–1.24)
GFR calc Af Amer: 5 mL/min — ABNORMAL LOW (ref >60)
GFR calc non Af Amer: 4 mL/min — ABNORMAL LOW (ref >60)
Glucose, Bld: 143 mg/dL — ABNORMAL HIGH (ref 65–99)
Phosphorus: 7.9 mg/dL — ABNORMAL HIGH (ref 2.5–4.6)
Potassium: 3.4 mmol/L — ABNORMAL LOW (ref 3.5–5.1)
Sodium: 137 mmol/L (ref 135–145)

## 2018-01-22 LAB — GLUCOSE, CAPILLARY
Glucose-Capillary: 135 mg/dL — ABNORMAL HIGH (ref 65–99)
Glucose-Capillary: 124 mg/dL — ABNORMAL HIGH (ref 65–99)
Glucose-Capillary: 95 mg/dL (ref 65–99)

## 2018-01-22 MED ORDER — PANTOPRAZOLE SODIUM 40 MG PO TBEC: 40.0000 mg | DELAYED_RELEASE_TABLET | Freq: Every day | ORAL | 0 refills | Status: DC

## 2018-01-22 MED ORDER — SODIUM CHLORIDE 0.9 % IV SOLN: 100.0000 mL | INTRAVENOUS | Status: DC | PRN

## 2018-01-22 MED ORDER — LEVOTHYROXINE SODIUM 100 MCG IV SOLR: 37.5000 ug | Freq: Every day | INTRAVENOUS | 0 refills | Status: DC

## 2018-01-22 MED ORDER — MIDODRINE HCL 10 MG PO TABS: 5.0000 mg | ORAL_TABLET | ORAL | 0 refills | Status: DC | PRN

## 2018-01-22 MED ORDER — ALTEPLASE 2 MG IJ SOLR
2.0000 mg | Freq: Once | INTRAMUSCULAR | Status: DC | PRN
  Filled 2018-01-22: qty 2

## 2018-01-22 NOTE — Progress Notes (Signed)
Patient is to be discharged and in stable condition. Patient's IV removed, WNL. Patient made aware of transfer and agreeable. Report given to Zachary Bamberg, LPN at [facility]. Patient will be transported upon completion of dialysis.  Celestia Khat, RN

## 2018-01-22 NOTE — Progress Notes (Signed)
Subjective: Interval History: Patient denies any nausea or vomiting.  Denies also any abdominal pain.  His appetite is good  Objective: Vital signs in last 24 hours: Temp:  [98.6 F (37 C)-99.6 F (37.6 C)] 99.2 F (37.3 C) (03/29 0530) Pulse Rate:  [99-108] 105 (03/29 0530) Resp:  [18] 18 (03/28 1500) BP: (96-116)/(57-61) 96/59 (03/29 0530) SpO2:  [96 %-97 %] 96 % (03/29 0530) Weight:  [90.4 kg (199 lb 4.7 oz)] 90.4 kg (199 lb 4.7 oz) (03/29 0530) Weight change: -1.6 kg (-3 lb 8.4 oz)  Intake/Output from previous day: No intake/output data recorded. Intake/Output this shift: No intake/output data recorded.  General appearance: alert, cooperative and no distress Resp: clear to auscultation bilaterally Cardio: regular rate and rhythm Extremities: No edema  Lab Results: Recent Labs    01/19/18 1608 01/20/18 0628  WBC 7.4 7.9  HGB 11.0* 10.8*  HCT 34.8* 33.7*  PLT 143* 137*   BMET:  Recent Labs    01/20/18 0628 01/21/18 0825  NA 139 136  K 4.2 3.7  CL 102 94*  CO2 19* 22  GLUCOSE 126* 125*  BUN 93* 44*  CREATININE 11.08* 7.58*  CALCIUM 8.0* 8.3*   No results for input(s): PTH in the last 72 hours. Iron Studies: No results for input(s): IRON, TIBC, TRANSFERRIN, FERRITIN in the last 72 hours.  Studies/Results: No results found.  I have reviewed the patient's current medications.  Assessment/Plan: 1] gastroenteritis: Presently patient is feeling better.  He does not have any diarrhea.  Patient is tolerating feeding. 2] end-stage renal disease: Is status post hemodialysis on Wednesday.  Presently he is a symptomatic. 3] anemia: His hemoglobin is within our target goal 4] MRSA infection: Patient is presently positive for MRSA.  At this moment source of infection not clear.  Patient has a fistula.  At this moment is afebrile and white blood cell count is normal.  Most likely contaminant.  His antibiotic is discontinued. 5] hypertension: His blood pressure is  reasonably controlled 6] fluid management: No sign of fluid overload. . 7] bone and mineral disorder: His calcium is a range but phosphorus is not available. Plan: 1] will make arrangement for patient to get dialysis today 2] we will remove 2 L if systolic blood pressures above 90 3] we will check his CBC and renal panel in the morning.   LOS: 3 days   Tremane Spurgeon S 01/22/2018,8:34 AM

## 2018-01-22 NOTE — NC FL2 (Signed)
[place] LEVEL OF CARE SCREENING TOOL     IDENTIFICATION  Patient Name: Zachary [name] Birthdate: 1945/12/07 Sex: male Admission Date (Current Location): 01/19/2018  [place] and [place] Number:  [place] [facility] [id number] [place] and Address:  [facility] [address] [place]      Provider Number: [id number]  Attending Physician Name and Address:  Phillips Grout, MD  Relative Name and Phone Number:       Current Level of Care: Hospital Recommended Level of Care: [place] Prior Approval Number:    Date Approved/Denied:   PASRR Number:    Discharge Plan: SNF    Current Diagnoses: Patient Active Problem List   Diagnosis Date Noted  . Gastroenteritis   . Hypothyroidism 01/19/2018  . Enteritis 01/19/2018  . S/P colectomy 08/31/2017  . Sepsis ([place]) 05/17/2017  . Colitis 05/17/2017  . Dysplastic polyp of colon   . Acute blood loss anemia 05/01/2017  . ESRD (end stage renal disease) ([place]) 05/01/2017  . Colonoscopy causing post-procedural bleeding   . Lower GI bleed   . DVT of leg (deep venous thrombosis) ([place]) 12/25/2016  . Closed comminuted intra-articular fracture of distal femur, right, initial encounter ([place]) 11/22/2016  . Hydronephrosis, bilateral 03/10/2016  . 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 Abnormal CT scan, esophagus   . Protein-calorie malnutrition, severe ([place]) 07/10/2015  . Essential hypertension 07/08/2015  . Hyperlipidemia 07/08/2015  . Obstructive uropathy 04/17/2015  . Bladder neck contracture   . Anemia, chronic disease 04/16/2015  . Benign prostatic hyperplasia with urinary retention 03/03/2015  . Mental retardation 10/10/2011  . Acute pyelonephritis 10/07/2011  . Bladder wall thickening 10/06/2011  . Iron deficiency anemia 10/02/2011  . Fever 10/01/2011  . Diabetes mellitus ([place]) 10/01/2011  . FX CLOSED FIBULA NOS 02/10/2008    Orientation RESPIRATION BLADDER  Height & Weight     Self, Place  Normal Incontinent Weight: 199 lb 4.7 oz (90.4 kg) Height:  5\' 10"  (177.8 cm)  BEHAVIORAL SYMPTOMS/MOOD NEUROLOGICAL BOWEL NUTRITION STATUS      Continent (low sodium/heart healthy)  AMBULATORY STATUS COMMUNICATION OF NEEDS Skin   Supervision Verbally Normal                       Personal Care Assistance Level of Assistance  Bathing, Feeding, Dressing Bathing Assistance: Limited assistance Feeding assistance: Independent Dressing Assistance: Limited assistance     Functional Limitations Info  Sight, Hearing, Speech Sight Info: Adequate Hearing Info: Adequate Speech Info: Adequate    SPECIAL CARE FACTORS FREQUENCY                       Contractures Contractures Info: Not present    Additional Factors Info  Code Status, Isolation Precautions Code Status Info: Full code       Isolation Precautions Info: 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 0.9 %  sodium chloride infusion  250 mL Intravenous PRN Manuella Ghazi, Pratik D, DO      . acetaminophen (TYLENOL) tablet 650 mg  650 mg Oral Q6H PRN Manuella Ghazi, Pratik D, DO       Or  . acetaminophen (TYLENOL) suppository 650 mg  650 mg Rectal Q6H PRN Manuella Ghazi, Pratik D, DO      . Chlorhexidine Gluconate Cloth  2 % PADS 6 each  6 each Topical Q0600 Roney Jaffe, MD   6 each at 01/22/18 [phone]  . epoetin alfa (EPOGEN,PROCRIT) injection 2,000 Units  2,000 Units Intravenous Q M,W,F-HD Bhutani, Manpreet S, MD      . feeding supplement (NEPRO CARB STEADY) liquid 237 mL  237 mL Oral BID BM Shah, Pratik D, DO   237 mL at 01/20/18 1048  . heparin injection 1,800 Units  20 Units/kg Dialysis PRN Liana Gerold, MD   1,800 Units at 01/20/18 1620  . heparin injection 5,000 Units  5,000 Units Subcutaneous BID Roney Jaffe, MD    5,000 Units at 01/21/18 2148  . insulin aspart (novoLOG) injection 0-15 Units  0-15 Units Subcutaneous TID Brita Romp, MD   2 Units at 01/21/18 1843  . levothyroxine (SYNTHROID, LEVOTHROID) injection 37.5 mcg  37.5 mcg Intravenous QAC breakfast Manuella Ghazi, Pratik D, DO   37.5 mcg at 01/21/18 1012  . lidocaine (PF) (XYLOCAINE) 1 % injection 5 mL  5 mL Intradermal PRN Bhutani, Manpreet S, MD      . lidocaine-prilocaine (EMLA) cream 1 application  1 application Topical PRN Bhutani, Manpreet S, MD      . midodrine (PROAMATINE) tablet 10 mg  10 mg Oral Q dialysis Manuella Ghazi, Pratik D, DO      . multivitamin with minerals tablet 1 tablet  1 tablet Oral QPM Manuella Ghazi, Pratik D, DO   1 tablet at 01/21/18 1843  . mupirocin ointment (BACTROBAN) 2 % 1 application  1 application Nasal BID Roney Jaffe, MD   1 application at 29/93/71 2148  . ondansetron (ZOFRAN) tablet 4 mg  4 mg Oral Q6H PRN Manuella Ghazi, Pratik D, DO       Or  . ondansetron (ZOFRAN) injection 4 mg  4 mg Intravenous Q6H PRN Manuella Ghazi, Pratik D, DO      . pantoprazole (PROTONIX) EC tablet 40 mg  40 mg Oral Daily Manuella Ghazi, Pratik D, DO   40 mg at 01/21/18 1010  . pentafluoroprop-tetrafluoroeth (GEBAUERS) aerosol 1 application  1 application Topical PRN Bhutani, Manpreet S, MD      . sevelamer carbonate (RENVELA) tablet 3,200 mg  3,200 mg Oral TID WC Shah, Pratik D, DO   3,200 mg at 01/21/18 1843  . sodium chloride flush (NS) 0.9 % injection 3 mL  3 mL Intravenous Q12H Shah, Pratik D, DO   3 mL at 01/21/18 1010  . sodium chloride flush (NS) 0.9 % injection 3 mL  3 mL Intravenous PRN Manuella Ghazi, Pratik D, DO      . tamsulosin (FLOMAX) capsule 0.4 mg  0.4 mg Oral QPM Shah, Pratik D, DO   0.4 mg at 01/21/18 1843     Discharge Medications: Please see discharge summary for a list of discharge medications.  Relevant Imaging Results:  Relevant Lab Results:   Additional Information SSN# [id number]. Receives HD TTS [address] Clydene Pugh, LCSW

## 2018-01-22 NOTE — Clinical Social Work Note (Signed)
Pt has orders for dc back to [facility] today. Updated Kerri at [facility]. Sent dc clinicals through the hub. Pt's RN can call report and transport pt any time. Will inform. No further SW needs at this time.

## 2018-01-22 NOTE — Clinical Social Work Note (Addendum)
Late entry: Patient is a long term resident at [facility]. Patient ambulates with a walker and uses a wheelchair. LCSW spoke with [name], Guardian with [facility], and provided status update.    Shaquita Fort, Clydene Pugh, LCSW

## 2018-01-22 NOTE — Care Management Important Message (Signed)
Important Message  Patient Details  Name: Zachary [name] MRN: [id number] Date of Birth: 01/28/1946   Medicare Important Message Given:  Yes    Shelda Altes 01/22/2018, 11:55 AM

## 2018-01-22 NOTE — Procedures (Signed)
     HEMODIALYSIS TREATMENT NOTE:   4 hour heparin-free dialysis completed via right upper arm AVF (15g/antegrade). Goal met: 2 liters removed without interruption in ultrafiltration.  All blood was returned and hemostasis was achieved in 15 minutes. Report given to Barbra Sarks, RN.   Rockwell Alexandria, RN, CDN

## 2018-01-22 NOTE — Discharge Summary (Addendum)
Physician Discharge Summary  EPIC TRIBBETT BJS:[id number] DOB: 1946-04-30 DOA: 01/19/2018  PCP: [name], MD  Admit date: 01/19/2018 Discharge date: 01/22/2018  Time spent: 35 minutes   Discharge Diagnoses:  Principal Problem:   Sepsis ([place]) ruled out Viral gastroenteritis Active Problems:   Fever   Anemia, chronic disease   Hyperlipidemia   ESRD (end stage renal disease) ([place])   Hypothyroidism   Enteritis   Gastroenteritis   Discharge Condition: Stable and improved   Filed Weights   01/20/18 2020 01/21/18 0559 01/22/18 0530  Weight: 90.5 kg (199 lb 8.3 oz) 90.5 kg (199 lb 8.3 oz) 90.4 kg (199 lb 4.7 oz)    History of present illness:  Zachary L Jonesis a 72 y.o.malew PMH of ESRD on HD TTS, DM2, BPH, HL, anemia presented with  vomiting and abdominal pain in the epigastric region that began last night. As a result of his symptoms, he did not go to hemodialysis and could not maintain adequate oral intake. The patient was a very poor historian as he is known to have mental retardation. According to the ED physician 2 brothers were at the bedside earlier, but they did not know much about his situation either. It appears that he was seen by Dr. Thornton Park a primary care visit just 1 day prior with plans to start low-dose Lantus on account of poor blood glucose readings. None of the symptoms appear to have been present during the visit yest.    Hospital Course:  72 year old male presented with abdominal pain nausea vomiting diarrhea.  There were concerns that he was septic.  However this is all thought to be due to viral gastroenteritis.  He is antibiotics were stopped yesterday.  He has been afebrile.  His symptoms have totally resolved.  Patient will be discharged today home after he receives dialysis today.  He does have a pancreatic head lesion that and will need follow-up CT as an outpatient.  He will need to follow-up with his primary care physician in 1-2 weeks.  Patient  is being discharged home in stable and improved condition after his dialysis session today.  Discharge Exam: Vitals:   01/21/18 2000 01/22/18 0530  BP: (!) 100/57 (!) 96/59  Pulse: (!) 108 (!) 105  Resp:    Temp: 99.6 F (37.6 C) 99.2 F (37.3 C)  SpO2: 97% 96%    General: Alert and oriented x4 Cardiovascular: Regular rate and rhythm without murmurs rubs or gallops Respiratory: Clear to auscultation bilaterally no wheezes rhonchi or rales  Discharge Instructions   Discharge Instructions    Diet - low sodium heart healthy   Complete by:  As directed    Increase activity slowly   Complete by:  As directed      Allergies as of 01/22/2018   No Known Allergies     Medication List    TAKE these medications   atorvastatin 40 MG tablet Commonly known as:  LIPITOR Take 40 mg by mouth every evening.   FIRST AID PLUS LIDOCAINE EX Apply 1 application topically Every Tuesday,Thursday,and Saturday with dialysis. Prior to dialysis   insulin glargine 100 UNIT/ML injection Commonly known as:  LANTUS Inject 5 Units into the skin at bedtime.   levothyroxine 75 MCG tablet Commonly known as:  SYNTHROID, LEVOTHROID Take 75 mcg by mouth daily before breakfast. What changed:  Another medication with the same name was added. Make sure you understand how and when to take each.   levothyroxine 100 MCG Solr injection Commonly  known as:  SYNTHROID, LEVOTHROID Inject 1.88 mLs (37.5 mcg total) into the vein daily before breakfast. What changed:  You were already taking a medication with the same name, and this prescription was added. Make sure you understand how and when to take each.   midodrine 10 MG tablet Commonly known as:  PROAMATINE Take 0.5 tablets (5 mg total) by mouth every dialysis (hypotension). What changed:    how much to take  when to take this  reasons to take this  additional instructions   multivitamin with minerals Tabs tablet Take 1 tablet by mouth every  evening.   NEPRO/CARBSTEADY PO Take 1 Can by mouth 2 (two) times daily.   omeprazole 40 MG capsule Commonly known as:  PRILOSEC Take 40 mg by mouth at bedtime.   sevelamer carbonate 800 MG tablet Commonly known as:  RENVELA Take 3,200 mg by mouth 3 (three) times daily with meals.   tamsulosin 0.4 MG Caps capsule Commonly known as:  FLOMAX Take 0.4 mg by mouth every evening. Give 30 minutes after a meal   torsemide 10 MG tablet Commonly known as:  DEMADEX Take 5 tablets (50 mg total) by mouth daily.   TRADJENTA 5 MG Tabs tablet Generic drug:  linagliptin Take 5 mg by mouth every evening.      No Known Allergies Follow-up Information    [name], MD Follow up in 1 week(s).   Specialty:  Internal Medicine Contact information: [place] [address] [phone]            The results of significant diagnostics from this hospitalization (including imaging, microbiology, ancillary and laboratory) are listed below for reference.    Significant Diagnostic Studies: Ct Abdomen Pelvis W Contrast  Result Date: 01/19/2018 CLINICAL DATA:  Nausea and vomiting for 1 day. Generalized abdominal pain. End-stage renal disease on dialysis. EXAM: CT ABDOMEN AND PELVIS WITH CONTRAST TECHNIQUE: Multidetector CT imaging of the abdomen and pelvis was performed using the standard protocol following bolus administration of intravenous contrast. CONTRAST:  140mL ISOVUE-300 IOPAMIDOL (ISOVUE-300) INJECTION 61% COMPARISON:  05/17/2017 FINDINGS: Lower chest: Minimal atelectasis in the lung bases. No pleural effusion. Coronary artery atherosclerosis. Hepatobiliary: There is a small area of ill-defined subtle hypoattenuation in the left hepatic lobe (series 2, image 13) which is unchanged from 10/05/2011 and likely benign. The gallbladder is completely decompressed with high density material in its lumen which may reflect small stones. No significant pericholecystic  inflammatory changes are seen. There is no biliary dilatation. Pancreas: 14 x 8 mm low-density lesion in the pancreatic head, not well seen on the prior noncontrast examination and new from 2012. No pancreatic ductal dilatation or acute inflammatory changes. Spleen: Unremarkable. Adrenals/Urinary Tract: Unremarkable adrenal glands. Mildly dilated right extrarenal pelvis. Mild diffuse dilatation of both ureters to the bladder without an obstructing stone. Slight dilatation of the left renal pelvis. Unchanged 1.6 cm left renal cyst with a punctate calcification along its inferior margin. Subcentimeter low-density lesions in both kidneys are too small to fully characterize. There is no significant excreted contrast in the renal collecting systems on delayed images. The bladder is diffusely thick walled. Stomach/Bowel: The stomach is within normal limits. Sequelae of partial colectomy are again identified with an ileocolic anastomosis in the pelvis. There is inflammatory stranding in the right lower quadrant associated with multiple small bowel loops demonstrating mild wall thickening. There is also mild wall thickening of several small bowel loops in the left mid and left lower abdomen. No  dilated loops of bowel are seen to indicate obstruction. Vascular/Lymphatic: Abdominal aortic atherosclerosis without aneurysm. No enlarged lymph nodes. Reproductive: Enlarged prostate indenting the bladder base. Other: No significant ascites. Postsurgical changes in the abdominal wall with abdominal wall laxity. Mild subcutaneous fat stranding in the left lower quadrant abdominal wall, nonspecific but may relate to medication injection. Musculoskeletal: Progressive endplate irregularity at L5-S1 without disc space collapse, favored to be degenerative with disc bulging contributing to bilateral neural foraminal stenosis. No significant paravertebral inflammation to strongly suggest infection. IMPRESSION: 1. Wall thickening of  multiple small bowel loops with inflammatory stranding in the right lower quadrant suggestive of an infectious or inflammatory enteritis. No bowel obstruction. 2. Mild bilateral hydroureter without obstructing stone. Diffuse bladder wall thickening, correlate for urinary tract infection. 3. 14 mm low-density/cystic pancreatic head lesion. Follow-up pancreatic protocol abdominal CT is recommended in 2 years. 4. Collapsed gallbladder possibly containing small stones. 5.  Aortic Atherosclerosis (ICD10-I70.0). Electronically Signed   By: Logan Bores M.D.   On: 01/19/2018 17:51   Dg Chest Port 1 View  Result Date: 01/19/2018 CLINICAL DATA:  Vomiting.  Fever. EXAM: PORTABLE CHEST 1 VIEW COMPARISON:  06/03/2017. FINDINGS: Mediastinum heart size appears stable. Low lung volumes. No focal infiltrate. No pleural effusion or pneumothorax. No acute bony abnormality. IMPRESSION: Low lung volumes.  No acute cardiopulmonary disease. Electronically Signed   By: Marcello Moores  Register   On: 01/19/2018 16:09    Microbiology: Recent Results (from the past 240 hour(s))  Blood Culture (routine x 2)     Status: Abnormal (Preliminary result)   Collection Time: 01/19/18  4:13 PM  Result Value Ref Range Status   Specimen Description   Final    BLOOD RIGHT ANTECUBITAL Performed at [facility], [address] [place], [place] [address]    Special Requests   Final    BOTTLES DRAWN AEROBIC ONLY Blood Culture adequate volume Performed at [facility], [address], [place], [place] [address]    Culture  Setup Time   Final    GRAM POSITIVE COCCI AEROBIC BOTTLE ONLY Gram Stain Report Called to,Read Back By and Verified With: WORKMAN K. AT 2229 ON [id number] BY THOMPSON S Organism ID to follow CRITICAL RESULT CALLED TO, READ BACK BY AND VERIFIED WITH: Verlene Mayer RN 2337 01/20/18 A BROWNING    Culture (A)  Final    STAPHYLOCOCCUS SPECIES (COAGULASE NEGATIVE) THE SIGNIFICANCE OF ISOLATING THIS ORGANISM FROM A SINGLE SET OF BLOOD  CULTURES WHEN MULTIPLE SETS ARE DRAWN IS UNCERTAIN. PLEASE NOTIFY THE MICROBIOLOGY DEPARTMENT WITHIN ONE WEEK IF SPECIATION AND SENSITIVITIES ARE REQUIRED. Performed at [facility], [place] [address], [place], [place] [address]    Report Status PENDING  Incomplete  Blood Culture (routine x 2)     Status: None (Preliminary result)   Collection Time: 01/19/18  4:13 PM  Result Value Ref Range Status   Specimen Description BLOOD RIGHT ANTECUBITAL  Final   Special Requests   Final    BOTTLES DRAWN AEROBIC ONLY Blood Culture adequate volume   Culture   Final    NO GROWTH 3 DAYS Performed at [facility], [address], [place], [place] [address]    Report Status PENDING  Incomplete  Blood Culture ID Panel (Reflexed)     Status: Abnormal   Collection Time: 01/19/18  4:13 PM  Result Value Ref Range Status   Enterococcus species NOT DETECTED NOT DETECTED Final   Listeria monocytogenes NOT DETECTED NOT DETECTED Final   Staphylococcus  species DETECTED (A) NOT DETECTED Final    Comment: Methicillin (oxacillin) resistant coagulase negative staphylococcus. Possible blood culture contaminant (unless isolated from more than one blood culture draw or clinical case suggests pathogenicity). No antibiotic treatment is indicated for blood  culture contaminants. CRITICAL RESULT CALLED TO, READ BACK BY AND VERIFIED WITH: Verlene Mayer RN [id number] 01/20/18 A BROWNING    Staphylococcus aureus NOT DETECTED NOT DETECTED Final   Methicillin resistance DETECTED (A) NOT DETECTED Final    Comment: CRITICAL RESULT CALLED TO, READ BACK BY AND VERIFIED WITH: Verlene Mayer RN 2337 01/20/18 A BROWNING    Streptococcus species NOT DETECTED NOT DETECTED Final   Streptococcus agalactiae NOT DETECTED NOT DETECTED Final   Streptococcus pneumoniae NOT DETECTED NOT DETECTED Final   Streptococcus pyogenes NOT DETECTED NOT DETECTED Final   Acinetobacter baumannii NOT DETECTED NOT DETECTED Final   Enterobacteriaceae species NOT DETECTED  NOT DETECTED Final   Enterobacter cloacae complex NOT DETECTED NOT DETECTED Final   Escherichia coli NOT DETECTED NOT DETECTED Final   Klebsiella oxytoca NOT DETECTED NOT DETECTED Final   Klebsiella pneumoniae NOT DETECTED NOT DETECTED Final   Proteus species NOT DETECTED NOT DETECTED Final   Serratia marcescens NOT DETECTED NOT DETECTED Final   Haemophilus influenzae NOT DETECTED NOT DETECTED Final   Neisseria meningitidis NOT DETECTED NOT DETECTED Final   Pseudomonas aeruginosa NOT DETECTED NOT DETECTED Final   Candida albicans NOT DETECTED NOT DETECTED Final   Candida glabrata NOT DETECTED NOT DETECTED Final   Candida krusei NOT DETECTED NOT DETECTED Final   Candida parapsilosis NOT DETECTED NOT DETECTED Final   Candida tropicalis NOT DETECTED NOT DETECTED Final    Comment: Performed at [facility], [place]. [address] [place], [place] [address]  MRSA PCR Screening     Status: Abnormal   Collection Time: 01/20/18  1:34 AM  Result Value Ref Range Status   MRSA by PCR POSITIVE (A) NEGATIVE Final    Comment:        The GeneXpert MRSA Assay (FDA approved for NASAL specimens only), is one component of a comprehensive MRSA colonization surveillance program. It is not intended to diagnose MRSA infection nor to guide or monitor treatment for MRSA infections. RESULT CALLED TO, READ BACK BY AND VERIFIED WITH: WORKMAN K. AT 0740A ON [id number] BY THOMPSON S. Performed at [facility], [address], [place], [place] [address]   C difficile quick scan w PCR reflex     Status: None   Collection Time: 01/21/18  3:46 PM  Result Value Ref Range Status   C Diff antigen NEGATIVE NEGATIVE Final   C Diff toxin NEGATIVE NEGATIVE Final   C Diff interpretation No C. difficile detected.  Final    Comment: Performed at [facility], [address] [place], [place] [address]  Culture, blood (Routine X 2) w Reflex to ID Panel     Status: None (Preliminary result)   Collection Time: 01/21/18  4:43 PM   Result Value Ref Range Status   Specimen Description BLOOD LEFT HAND  Final   Special Requests   Final    BOTTLES DRAWN AEROBIC ONLY Blood Culture results may not be optimal due to an inadequate volume of blood received in culture bottles   Culture   Final    NO GROWTH < 24 HOURS Performed at [facility], [address], [place], [place] [address]    Report Status PENDING  Incomplete  Culture, blood (Routine X 2) w Reflex to ID  Panel     Status: None (Preliminary result)   Collection Time: 01/21/18  8:07 PM  Result Value Ref Range Status   Specimen Description BLOOD LEFT ARM  Final   Special Requests   Final    BOTTLES DRAWN AEROBIC AND ANAEROBIC Blood Culture adequate volume   Culture   Final    NO GROWTH < 12 HOURS Performed at [facility], [address], [place], [place] [address]    Report Status PENDING  Incomplete     Labs: Basic Metabolic Panel: Recent Labs  Lab 01/19/18 0700 01/19/18 1608 01/20/18 0628 01/21/18 0825  NA 137 135 139 136  K 4.5 5.1 4.2 3.7  CL 99* 99* 102 94*  CO2 20* 20* 19* 22  GLUCOSE 217* 208* 126* 125*  BUN 81* 88* 93* 44*  CREATININE 10.26* 10.85* 11.08* 7.58*  CALCIUM 8.4* 7.8* 8.0* 8.3*   Liver Function Tests: Recent Labs  Lab 01/19/18 1608  AST 22  ALT 18  ALKPHOS 152*  BILITOT 0.8  PROT 6.8  ALBUMIN 3.3*   No results for input(s): LIPASE, AMYLASE in the last 168 hours. No results for input(s): AMMONIA in the last 168 hours. CBC: Recent Labs  Lab 01/19/18 0700 01/19/18 1608 01/20/18 0628  WBC 8.9 7.4 7.9  NEUTROABS 6.8 5.3  --   HGB 11.6* 11.0* 10.8*  HCT 36.3* 34.8* 33.7*  MCV 106.1* 106.4* 106.3*  PLT 145* 143* 137*   Cardiac Enzymes: No results for input(s): CKTOTAL, CKMB, CKMBINDEX, TROPONINI in the last 168 hours. BNP: BNP (last 3 results) No results for input(s): BNP in the last 8760 hours.  ProBNP (last 3 results) No results for input(s): PROBNP in the last 8760 hours.  CBG: Recent Labs  Lab  01/21/18 0819 01/21/18 1128 01/21/18 1646 01/21/18 2112 01/22/18 0741  GLUCAP 129* 126* 142* 138* 135*       Signed:  Dewana Ammirati A MD.  [facility] 01/22/2018, 8:08 AM  Iv levothyroxine has been stopped at dc

## 2018-01-23 DIAGNOSIS — D509 Iron deficiency anemia, unspecified: Secondary | ICD-10-CM | POA: Diagnosis not present

## 2018-01-23 DIAGNOSIS — Z992 Dependence on renal dialysis: Secondary | ICD-10-CM | POA: Diagnosis not present

## 2018-01-23 DIAGNOSIS — Z23 Encounter for immunization: Secondary | ICD-10-CM | POA: Diagnosis not present

## 2018-01-23 DIAGNOSIS — N2581 Secondary hyperparathyroidism of renal origin: Secondary | ICD-10-CM | POA: Diagnosis not present

## 2018-01-23 DIAGNOSIS — D631 Anemia in chronic kidney disease: Secondary | ICD-10-CM | POA: Diagnosis not present

## 2018-01-23 DIAGNOSIS — N186 End stage renal disease: Secondary | ICD-10-CM | POA: Diagnosis not present

## 2018-01-24 DIAGNOSIS — N186 End stage renal disease: Secondary | ICD-10-CM | POA: Diagnosis not present

## 2018-01-24 DIAGNOSIS — Z992 Dependence on renal dialysis: Secondary | ICD-10-CM | POA: Diagnosis not present

## 2018-01-24 LAB — CULTURE, BLOOD (ROUTINE X 2)
Culture: NO GROWTH
Special Requests: ADEQUATE

## 2018-01-25 ENCOUNTER — Non-Acute Institutional Stay (SKILLED_NURSING_FACILITY): Payer: Medicare Other | Admitting: Internal Medicine

## 2018-01-25 ENCOUNTER — Encounter: Payer: Self-pay | Admitting: Internal Medicine

## 2018-01-25 DIAGNOSIS — D509 Iron deficiency anemia, unspecified: Secondary | ICD-10-CM | POA: Diagnosis not present

## 2018-01-25 DIAGNOSIS — N186 End stage renal disease: Secondary | ICD-10-CM

## 2018-01-25 DIAGNOSIS — E1121 Type 2 diabetes mellitus with diabetic nephropathy: Secondary | ICD-10-CM

## 2018-01-25 DIAGNOSIS — E039 Hypothyroidism, unspecified: Secondary | ICD-10-CM | POA: Diagnosis not present

## 2018-01-25 NOTE — Progress Notes (Signed)
Provider:Janari Yamada, Lyndel Safe  Location:   [facility] Room Number: 112/D Place of Service:  SNF (31)  PCP: [name], MD Patient Care Team: [name], MD as PCP - General (Internal Medicine) Cassandria Anger, MD as Consulting Physician (Endocrinology) Fran Lowes, MD as Consulting Physician (Nephrology) Gala Romney Cristopher Estimable, MD as Consulting Physician (Gastroenterology)  Extended Emergency Contact Information Primary Emergency Contact: Betsey Amen Address: DSS  [place] Phone: [phone] Work Phone: [phone] Relation: Legal Guardian Secondary Emergency Contact: Ferrin,Richard Address: [address]          [place], [place] [address] [place] Phone: [phone] Relation: Brother  Code Status: Full Code Goals of Care: Advanced Directive information Advanced Directives 01/25/2018  Does Patient Have a Medical Advance Directive? Yes  Type of Advance Directive (No Data)  Does patient want to make changes to medical advance directive? No - Patient declined  Copy of [place] in Chart? No - copy requested  Would patient like information on creating a medical advance directive? No - Patient declined  Pre-existing out of facility DNR order (yellow form or pink MOST form) -      Chief Complaint  Patient presents with  . Readmit To SNF    Readmission Visit    HPI: Patient is a 72 y.o. male seen today for Readmission to SNF for Long term Placement. 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  He also had some tachycardia and low-grade fever.  Initially in ED his lactic acid was elevated at 2.5. CT scan of the abdomen and pelvis was consistent with enteritis of small bowel.  He was  initially started on antibiotics but since he stayed afebrile and did not have any leukocytosis they were stopped.  He improved in 48 hours and was discharged back to the facility.  His blood cultures were negative. Patient is back to his baseline in the facility.  Denied any nausea vomiting or abdominal pain.  According to the nurses he does eat food which his family brings from outside.  Past Medical History:  Diagnosis Date  . Abnormal CT scan, kidney 10/06/2011  . Acute pyelonephritis 10/07/2011  . Anemia    normocytic  . Anxiety    mental retardation  . Bladder wall thickening 10/06/2011  . BPH (benign prostatic hypertrophy)   . Diabetes mellitus   . Dialysis patient [facility])    Tuesday, Thursday and Saturday,   . Edema     history of lower extremity edema  . GERD (gastroesophageal reflux disease)   . Heme positive stool   . Hydronephrosis   . Hyperkalemia   . Hyperlipidemia   . Hypernatremia   . Hypertension   . Hypothyroidism   . Impaired speech   . 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 MR (mental retardation)   . Muscle weakness   . Obstructive uropathy   . 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 Poor historian poor historian  . Protein calorie malnutrition ([place])   . Pyelonephritis   . Renal failure (ARF), acute on chronic (HCC)   . Renal insufficiency    chronic history  . Sepsis ([place])   . Smoking   . Uremia   . Urinary retention   . UTI (lower urinary tract infection) 10/06/2011   Past Surgical  History:  Procedure Laterality Date  . AV FISTULA PLACEMENT Left 07/06/2015   Procedure:  INSERTION LEFT ARM ARTERIOVENOUS GORTEX GRAFT;  Surgeon: Angelia Mould, MD;  Location: [place];  Service: Vascular;  Laterality: Left;  . AV FISTULA PLACEMENT Right 02/26/2016   Procedure: ARTERIOVENOUS (AV) FISTULA CREATION ;  Surgeon: Angelia Mould, MD;  Location: [place];  Service: Vascular;  Laterality: Right;  . [place] REMOVAL Left 10/09/2015   Procedure: REMOVAL OF  ARTERIOVENOUS GORETEX GRAFT ([place]) Evacuation of Lymphocele, Vein Patch angioplasty of brachial artery.;  Surgeon: Angelia Mould, MD;  Location: [place];  Service: Vascular;  Laterality: Left;  . BASCILIC VEIN TRANSPOSITION Right 02/26/2016   Procedure: Right BASCILIC VEIN TRANSPOSITION;  Surgeon: Angelia Mould, MD;  Location: [place];  Service: Vascular;  Laterality: Right;  . CIRCUMCISION N/A 01/04/2014   Procedure: CIRCUMCISION ADULT (procedure #1);  Surgeon: Marissa Nestle, MD;  Location: [facility];  Service: Urology;  Laterality: N/A;  . COLECTOMY N/A 05/04/2017   Procedure: TOTAL COLECTOMY;  Surgeon: Aviva Signs, MD;  Location: [facility];  Service: General;  Laterality: N/A;  . COLONOSCOPY N/A 04/27/2017   Procedure: COLONOSCOPY;  Surgeon: Daneil Dolin, MD;  [facility];  Service: Endoscopy;  Laterality: N/A;  245  . CYSTOSCOPY W/ RETROGRADES Bilateral 06/29/2015   Procedure: CYSTOSCOPY, DILATION OF URETHRAL STRICTURE WITH BILATERAL RETROGRADE PYELOGRAM,SUPRAPUBIC TUBE CHANGE;  Surgeon: Festus Aloe, MD;  Location: [facility];  Service: Urology;  Laterality: Bilateral;  . CYSTOSCOPY WITH URETHRAL DILATATION N/A 12/29/2013   Procedure: CYSTOSCOPY WITH URETHRAL DILATATION;  Surgeon: Marissa Nestle, MD;  Location[facility];  Service: Urology;  Laterality: N/A;  . ESOPHAGOGASTRODUODENOSCOPY N/A 04/27/2017   Procedure: ESOPHAGOGASTRODUODENOSCOPY (EGD);  Surgeon: Daneil Dolin, MD;  [facility];  Service: Endoscopy;  Laterality: N/A;  . ORIF FEMUR FRACTURE Right 11/22/2016   Procedure: OPEN REDUCTION INTERNAL FIXATION (ORIF) DISTAL FEMUR FRACTURE;  Surgeon: Rod Can, MD;  Location: [place];  Service: Orthopedics;  Laterality: Right;  . PATCH ANGIOPLASTY Right 12/10/2017   Procedure: PATCH ANGIOPLASTY;  Surgeon: Angelia Mould, MD;  Location: [facility];  Service: Vascular;  Laterality: Right;  . PERIPHERAL VASCULAR CATHETERIZATION N/A 10/08/2015   Procedure: A/V  Shuntogram;  Surgeon: Angelia Mould, MD;  Location: [place] CV LAB;  Service: Cardiovascular;  Laterality: N/A;  . THROMBECTOMY W/ EMBOLECTOMY Right 12/10/2017   Procedure: THROMBECTOMY REVISION RIGHT ARM  ARTERIOVENOUS FISTULA;  Surgeon: Angelia Mould, MD;  Location: [place];  Service: Vascular;  Laterality: Right;  . TRANSURETHRAL RESECTION OF PROSTATE N/A 01/04/2014   Procedure: TRANSURETHRAL RESECTION OF THE PROSTATE (TURP) (procedure #2);  Surgeon: Marissa Nestle, MD;  Location[facility];  Service: Urology;  Laterality: N/A;    reports that he has never smoked. He has never used smokeless tobacco. He reports that he does not drink alcohol or use drugs. Social History   Socioeconomic History  . Marital status: Single    Spouse name: Not on file  . Number of children: Not on file  . Years of education: Not on file  . Highest education level: Not on file  Occupational History  . Not on file  Social Needs  . Financial resource strain: Not on file  . Food insecurity:    Worry: Not on file    Inability: Not on file  . Transportation needs:    Medical: Not on file    Non-medical: Not on file  Tobacco  Use  . Smoking status: Never Smoker  . Smokeless tobacco: Never Used  Substance and Sexual Activity  . Alcohol use: No    Alcohol/week: 0.0 oz    Comment: occ. use   . Drug use: No  . Sexual activity: Never  Lifestyle  . Physical activity:    Days per week: Not on file    Minutes per session: Not on file  . Stress: Not on file  Relationships  . Social connections:    Talks on phone: Not on file    Gets together: Not on file    Attends religious service: Not on file    Active member of club or organization: Not on file    Attends meetings of clubs or organizations: Not on file    Relationship status: Not on file  . Intimate partner violence:    Fear of current or ex partner: Not on file    Emotionally abused: Not on file    Physically abused: Not on file      Forced sexual activity: Not on file  Other Topics Concern  . Not on file  Social History Narrative   Lives at nursing home.    Functional Status Survey:    Family History  Problem Relation Age of Onset  . Cancer Mother   . Colon cancer Neg Hx     Health Maintenance  Topic Date Due  . OPHTHALMOLOGY EXAM  02/18/2018 (Originally 08/23/1956)  . URINE MICROALBUMIN  02/18/2018 (Originally 08/23/1956)  . INFLUENZA VACCINE  05/27/2018  . FOOT EXAM  07/22/2018  . HEMOGLOBIN A1C  07/22/2018  . COLONOSCOPY  04/28/2027  . TETANUS/TDAP  08/23/2027  . Hepatitis C Screening  Completed  . PNA vac Low Risk Adult  Completed    No Known Allergies  Outpatient Encounter Medications as of 01/25/2018  Medication Sig  . atorvastatin (LIPITOR) 40 MG tablet Take 40 mg by mouth every evening.  . insulin glargine (LANTUS) 100 UNIT/ML injection Inject 5 Units into the skin at bedtime.  Marland Kitchen levothyroxine (SYNTHROID, LEVOTHROID) 75 MCG tablet Take 75 mcg by mouth daily before breakfast.   . linagliptin (TRADJENTA) 5 MG TABS tablet Take 5 mg by mouth every evening.   . midodrine (PROAMATINE) 10 MG tablet Take 0.5 tablets (5 mg total) by mouth every dialysis (hypotension).  . Multiple Vitamin (MULTIVITAMIN WITH MINERALS) TABS tablet Take 1 tablet by mouth every evening.   . Neo-Bacit-Poly-Lidocaine (FIRST AID PLUS LIDOCAINE EX) Apply 1 application topically Every Tuesday,Thursday,and Saturday with dialysis. Prior to dialysis  . Nutritional Supplements (NEPRO/CARBSTEADY PO) Take 1 Can by mouth 2 (two) times daily.  Marland Kitchen omeprazole (PRILOSEC) 40 MG capsule Take 40 mg by mouth at bedtime.   . sevelamer carbonate (RENVELA) 800 MG tablet Take 3,200 mg by mouth 3 (three) times daily with meals.   . sevelamer carbonate (RENVELA) 800 MG tablet Take 1,600 mg by mouth at bedtime.  . tamsulosin (FLOMAX) 0.4 MG CAPS capsule Take 0.4 mg by mouth every evening. Give 30 minutes after a meal  . torsemide (DEMADEX) 10 MG  tablet Take 5 tablets (50 mg total) by mouth daily.   No facility-administered encounter medications on file as of 01/25/2018.      Review of Systems  Review of Systems  Constitutional: Negative for activity change, appetite change, chills, diaphoresis, fatigue and fever.  HENT: Negative for mouth sores, postnasal drip, rhinorrhea, sinus pain and sore throat.   Respiratory: Negative for apnea, cough, chest tightness, shortness of breath  and wheezing.   Cardiovascular: Negative for chest pain, palpitations and leg swelling.  Gastrointestinal: Negative for abdominal distention, abdominal pain, constipation, diarrhea, nausea and vomiting.  Genitourinary: Negative for dysuria and frequency.  Musculoskeletal: Negative for arthralgias, joint swelling and myalgias.  Skin: Negative for rash.  Neurological: Negative for dizziness, syncope, weakness, light-headedness and numbness.  Psychiatric/Behavioral: Negative for behavioral problems, confusion and sleep disturbance.     Vitals:   01/25/18 1037  BP: 116/74  Pulse: 98  Resp: 18  Temp: 98.3 F (36.8 C)  TempSrc: Oral  SpO2: 99%   There is no height or weight on file to calculate BMI. Physical Exam  Constitutional: He appears well-developed and well-nourished.  HENT:  Head: Normocephalic.  Mouth/Throat: Oropharynx is clear and moist.  Eyes: Pupils are equal, round, and reactive to light.  Neck: Neck supple.  Cardiovascular: Normal rate and normal heart sounds.  Pulmonary/Chest: Effort normal and breath sounds normal. No respiratory distress. He has no wheezes.  Abdominal: Soft. Bowel sounds are normal. He exhibits no distension. There is no tenderness. There is no rebound.  Musculoskeletal: He exhibits no edema.  Neurological: He is alert.  Skin: Skin is warm and dry.  Psychiatric: He has a normal mood and affect. His behavior is normal. Thought content normal.    Labs reviewed: Basic Metabolic Panel: Recent Labs     06/08/17 0519 06/10/17 0727  01/20/18 0628 01/21/18 0825 01/22/18 0945  NA 134* 137   < > 139 136 137  K 3.5 3.2*   < > 4.2 3.7 3.4*  CL 97* 100*   < > 102 94* 94*  CO2 27 26   < > 19* 22 21*  GLUCOSE 96 146*   < > 126* 125* 143*  BUN 50* 34*   < > 93* 44* 61*  CREATININE 5.68* 4.96*   < > 11.08* 7.58* 10.63*  CALCIUM 8.0* 8.4*   < > 8.0* 8.3* 8.2*  PHOS 5.1* 3.7  --   --   --  7.9*   < > = values in this interval not displayed.   Liver Function Tests: Recent Labs    05/18/17 1026  06/03/17 0055  06/10/17 0727 01/19/18 1608 01/22/18 0945  AST 29  --  16  --   --  22  --   ALT 15*  --  15*  --   --  18  --   ALKPHOS 92  --  87  --   --  152*  --   BILITOT 0.9  --  0.5  --   --  0.8  --   PROT 5.3*  --  6.5  --   --  6.8  --   ALBUMIN 2.0*   < > 2.4*   < > 2.1* 3.3* 3.2*   < > = values in this interval not displayed.   No results for input(s): LIPASE, AMYLASE in the last 8760 hours. No results for input(s): AMMONIA in the last 8760 hours. CBC: Recent Labs    11/14/17 0435  01/19/18 0700 01/19/18 1608 01/20/18 0628 01/22/18 0945  WBC 5.2  --  8.9 7.4 7.9 6.2  NEUTROABS 2.6  --  6.8 5.3  --   --   HGB 10.3*   < > 11.6* 11.0* 10.8* 10.9*  HCT 31.3*   < > 36.3* 34.8* 33.7* 34.3*  MCV 109.1*  --  106.1* 106.4* 106.3* 106.9*  PLT 130*  --  145* 143* 137* 153   < > =  values in this interval not displayed.   Cardiac Enzymes: No results for input(s): CKTOTAL, CKMB, CKMBINDEX, TROPONINI in the last 8760 hours. BNP: Invalid input(s): POCBNP Lab Results  Component Value Date   HGBA1C 7.6 (H) 01/19/2018   Lab Results  Component Value Date   TSH 2.197 11/14/2017   Lab Results  Component Value Date   VITAMINB12 345 01/19/2018   Lab Results  Component Value Date   FOLATE 56.7 05/01/2017   Lab Results  Component Value Date   IRON 34 (L) 05/01/2017   TIBC 231 (L) 05/01/2017   FERRITIN 85 05/01/2017    Imaging and Procedures obtained prior to SNF  admission: No results found.  Assessment/Plan  Abdominal pain with nausea and vomiting now resolved Discharge diagnosis was viral gastroenteritis. Patient symptoms are completely resolved We will continue to follow him in the facility   [place] abdominal CT scan showed  14 mm low-density/cystic pancreatic head lesion. Follow-up pancreatic protocol abdominal CT is recommended in 2 years.  Diabetes Mellitus Patient BS were consistently above 200-250 He has gained almost 30 lbs. He is on Tradjenta  started him on Low dose Lantus. BS slightly better Repeat A1C was 7.6  Hyperlipidemia Increase Lipitor. Will repeat Lipid Profile   ESRD On Dialysis Gets Midodrine for His low BP on Dialysis day  S/P colectomy Appetite has improved and is doing well with his weight  Benign prostatic hyperplasia  On Flomax Iron deficiency anemia, On Nepro Hgb Stable Hypothyroid Continue same dose of Synthroid TSH normal in 01/19 Family/ staff Communication:   Labs/tests ordered:  Total time spent in this patient care encounter was 45_ minutes; greater than 50% of the visit spent counseling patient, reviewing records , Labs and coordinating care for problems addressed at this encounter.

## 2018-01-26 DIAGNOSIS — D509 Iron deficiency anemia, unspecified: Secondary | ICD-10-CM | POA: Diagnosis not present

## 2018-01-26 DIAGNOSIS — N2581 Secondary hyperparathyroidism of renal origin: Secondary | ICD-10-CM | POA: Diagnosis not present

## 2018-01-26 DIAGNOSIS — N186 End stage renal disease: Secondary | ICD-10-CM | POA: Diagnosis not present

## 2018-01-26 DIAGNOSIS — Z992 Dependence on renal dialysis: Secondary | ICD-10-CM | POA: Diagnosis not present

## 2018-01-26 LAB — CULTURE, BLOOD (ROUTINE X 2)
Culture: NO GROWTH
Culture: NO GROWTH
Special Requests: ADEQUATE

## 2018-01-28 DIAGNOSIS — N186 End stage renal disease: Secondary | ICD-10-CM | POA: Diagnosis not present

## 2018-01-28 DIAGNOSIS — N2581 Secondary hyperparathyroidism of renal origin: Secondary | ICD-10-CM | POA: Diagnosis not present

## 2018-01-28 DIAGNOSIS — D509 Iron deficiency anemia, unspecified: Secondary | ICD-10-CM | POA: Diagnosis not present

## 2018-01-28 DIAGNOSIS — Z992 Dependence on renal dialysis: Secondary | ICD-10-CM | POA: Diagnosis not present

## 2018-01-30 DIAGNOSIS — Z992 Dependence on renal dialysis: Secondary | ICD-10-CM | POA: Diagnosis not present

## 2018-01-30 DIAGNOSIS — D509 Iron deficiency anemia, unspecified: Secondary | ICD-10-CM | POA: Diagnosis not present

## 2018-01-30 DIAGNOSIS — N186 End stage renal disease: Secondary | ICD-10-CM | POA: Diagnosis not present

## 2018-01-30 DIAGNOSIS — N2581 Secondary hyperparathyroidism of renal origin: Secondary | ICD-10-CM | POA: Diagnosis not present

## 2018-02-02 DIAGNOSIS — N186 End stage renal disease: Secondary | ICD-10-CM | POA: Diagnosis not present

## 2018-02-02 DIAGNOSIS — D509 Iron deficiency anemia, unspecified: Secondary | ICD-10-CM | POA: Diagnosis not present

## 2018-02-02 DIAGNOSIS — Z992 Dependence on renal dialysis: Secondary | ICD-10-CM | POA: Diagnosis not present

## 2018-02-02 DIAGNOSIS — N2581 Secondary hyperparathyroidism of renal origin: Secondary | ICD-10-CM | POA: Diagnosis not present

## 2018-02-04 DIAGNOSIS — D509 Iron deficiency anemia, unspecified: Secondary | ICD-10-CM | POA: Diagnosis not present

## 2018-02-04 DIAGNOSIS — Z992 Dependence on renal dialysis: Secondary | ICD-10-CM | POA: Diagnosis not present

## 2018-02-04 DIAGNOSIS — N2581 Secondary hyperparathyroidism of renal origin: Secondary | ICD-10-CM | POA: Diagnosis not present

## 2018-02-04 DIAGNOSIS — N186 End stage renal disease: Secondary | ICD-10-CM | POA: Diagnosis not present

## 2018-02-06 DIAGNOSIS — N2581 Secondary hyperparathyroidism of renal origin: Secondary | ICD-10-CM | POA: Diagnosis not present

## 2018-02-06 DIAGNOSIS — D509 Iron deficiency anemia, unspecified: Secondary | ICD-10-CM | POA: Diagnosis not present

## 2018-02-06 DIAGNOSIS — N186 End stage renal disease: Secondary | ICD-10-CM | POA: Diagnosis not present

## 2018-02-06 DIAGNOSIS — Z992 Dependence on renal dialysis: Secondary | ICD-10-CM | POA: Diagnosis not present

## 2018-02-09 DIAGNOSIS — N186 End stage renal disease: Secondary | ICD-10-CM | POA: Diagnosis not present

## 2018-02-09 DIAGNOSIS — Z992 Dependence on renal dialysis: Secondary | ICD-10-CM | POA: Diagnosis not present

## 2018-02-09 DIAGNOSIS — N2581 Secondary hyperparathyroidism of renal origin: Secondary | ICD-10-CM | POA: Diagnosis not present

## 2018-02-09 DIAGNOSIS — D509 Iron deficiency anemia, unspecified: Secondary | ICD-10-CM | POA: Diagnosis not present

## 2018-02-11 DIAGNOSIS — N2581 Secondary hyperparathyroidism of renal origin: Secondary | ICD-10-CM | POA: Diagnosis not present

## 2018-02-11 DIAGNOSIS — N186 End stage renal disease: Secondary | ICD-10-CM | POA: Diagnosis not present

## 2018-02-11 DIAGNOSIS — Z992 Dependence on renal dialysis: Secondary | ICD-10-CM | POA: Diagnosis not present

## 2018-02-11 DIAGNOSIS — D509 Iron deficiency anemia, unspecified: Secondary | ICD-10-CM | POA: Diagnosis not present

## 2018-02-13 DIAGNOSIS — D509 Iron deficiency anemia, unspecified: Secondary | ICD-10-CM | POA: Diagnosis not present

## 2018-02-13 DIAGNOSIS — Z992 Dependence on renal dialysis: Secondary | ICD-10-CM | POA: Diagnosis not present

## 2018-02-13 DIAGNOSIS — N2581 Secondary hyperparathyroidism of renal origin: Secondary | ICD-10-CM | POA: Diagnosis not present

## 2018-02-13 DIAGNOSIS — N186 End stage renal disease: Secondary | ICD-10-CM | POA: Diagnosis not present

## 2018-02-16 DIAGNOSIS — N2581 Secondary hyperparathyroidism of renal origin: Secondary | ICD-10-CM | POA: Diagnosis not present

## 2018-02-16 DIAGNOSIS — D509 Iron deficiency anemia, unspecified: Secondary | ICD-10-CM | POA: Diagnosis not present

## 2018-02-16 DIAGNOSIS — N186 End stage renal disease: Secondary | ICD-10-CM | POA: Diagnosis not present

## 2018-02-16 DIAGNOSIS — Z992 Dependence on renal dialysis: Secondary | ICD-10-CM | POA: Diagnosis not present

## 2018-02-18 ENCOUNTER — Non-Acute Institutional Stay (SKILLED_NURSING_FACILITY): Payer: Medicare Other

## 2018-02-18 DIAGNOSIS — N2581 Secondary hyperparathyroidism of renal origin: Secondary | ICD-10-CM | POA: Diagnosis not present

## 2018-02-18 DIAGNOSIS — N186 End stage renal disease: Secondary | ICD-10-CM | POA: Diagnosis not present

## 2018-02-18 DIAGNOSIS — Z0000 Encounter for general adult medical examination without abnormal findings: Secondary | ICD-10-CM

## 2018-02-18 DIAGNOSIS — D509 Iron deficiency anemia, unspecified: Secondary | ICD-10-CM | POA: Diagnosis not present

## 2018-02-18 DIAGNOSIS — I739 Peripheral vascular disease, unspecified: Secondary | ICD-10-CM | POA: Diagnosis not present

## 2018-02-18 DIAGNOSIS — L603 Nail dystrophy: Secondary | ICD-10-CM | POA: Diagnosis not present

## 2018-02-18 DIAGNOSIS — I803 Phlebitis and thrombophlebitis of lower extremities, unspecified: Secondary | ICD-10-CM | POA: Diagnosis not present

## 2018-02-18 DIAGNOSIS — Z992 Dependence on renal dialysis: Secondary | ICD-10-CM | POA: Diagnosis not present

## 2018-02-18 NOTE — Progress Notes (Signed)
Subjective:   Zachary [name] is a 72 y.o. male who presents for Medicare Annual/Subsequent preventive examination at [place]  Last AWV-09/12/2016       Objective:    Vitals: BP (!) 122/58 (BP Location: Left Arm, Patient Position: Sitting)   Pulse 100   Temp 98.6 F (37 C) (Oral)   Ht 5\' 8"  (1.727 m)   Wt 200 lb (90.7 kg)   SpO2 98%   BMI 30.41 kg/m   Body mass index is 30.41 kg/m.  Advanced Directives 02/18/2018 01/25/2018 01/20/2018 01/19/2018 01/18/2018 12/10/2017 11/13/2017  Does Patient Have a Medical Advance Directive? Yes Yes No No Yes Yes Yes  Type of Advance Directive (No Data) (No Data) - - (No Data) (No Data) (No Data)  Does patient want to make changes to medical advance directive? No - Patient declined No - Patient declined No - Patient declined No - Patient declined No - Patient declined No - Patient declined No - Patient declined  Copy of [place] in Chart? - No - copy requested - Yes No - copy requested No - copy requested No - copy requested  Would patient like information on creating a medical advance directive? No - Patient declined No - Patient declined No - Patient declined No - Patient declined No - Patient declined No - Patient declined No - Patient declined  Pre-existing out of facility DNR order (yellow form or pink MOST form) - - - - - - -    Tobacco Social History   Tobacco Use  Smoking Status Never Smoker  Smokeless Tobacco Never Used     Counseling given: Not Answered   Clinical Intake:  Pre-visit preparation completed: No  Pain : No/denies pain     Nutritional Risks: None Diabetes: Yes CBG done?: No Did pt. bring in CBG monitor from home?: No  How often do you need to have someone help you when you read instructions, pamphlets, or other written materials from your doctor or pharmacy?: 1 - Never What is the last grade level you completed in school?: 9th grade  Interpreter Needed?:  No  Information entered by :: Tyson Dense, RN  Past Medical History:  Diagnosis Date  . Abnormal CT scan, kidney 10/06/2011  . Acute pyelonephritis 10/07/2011  . Anemia    normocytic  . Anxiety    mental retardation  . Bladder wall thickening 10/06/2011  . BPH (benign prostatic hypertrophy)   . Diabetes mellitus   . Dialysis patient [facility])    Tuesday, Thursday and Saturday,   . Edema     history of lower extremity edema  . GERD (gastroesophageal reflux disease)   . Heme positive stool   . Hydronephrosis   . Hyperkalemia   . Hyperlipidemia   . Hypernatremia   . Hypertension   . Hypothyroidism   . Impaired speech   . 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 MR (mental retardation)   . Muscle weakness   . Obstructive uropathy   . 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 Poor historian poor historian  . Protein calorie malnutrition ([place])   . Pyelonephritis   . Renal failure (ARF), acute on chronic (HCC)   . Renal insufficiency    chronic history  . Sepsis ([place])   . Smoking   . Uremia   . Urinary retention   . UTI (lower urinary tract infection) 10/06/2011   Past Surgical History:  Procedure Laterality Date  . AV FISTULA  PLACEMENT Left 07/06/2015   Procedure:  INSERTION LEFT ARM ARTERIOVENOUS GORTEX GRAFT;  Surgeon: Angelia Mould, MD;  Location: [place];  Service: Vascular;  Laterality: Left;  . AV FISTULA PLACEMENT Right 02/26/2016   Procedure: ARTERIOVENOUS (AV) FISTULA CREATION ;  Surgeon: Angelia Mould, MD;  Location: [place];  Service: Vascular;  Laterality: Right;  . [place] REMOVAL Left 10/09/2015   Procedure: REMOVAL OF ARTERIOVENOUS GORETEX GRAFT ([place]) Evacuation of Lymphocele, Vein Patch angioplasty of brachial artery.;  Surgeon: Angelia Mould, MD;  Location: [place];  Service: Vascular;  Laterality: Left;  . BASCILIC VEIN TRANSPOSITION Right 02/26/2016   Procedure: Right BASCILIC VEIN TRANSPOSITION;  Surgeon: Angelia Mould, MD;  Location: [place];  Service: Vascular;  Laterality: Right;  . CIRCUMCISION N/A 01/04/2014   Procedure: CIRCUMCISION ADULT (procedure #1);  Surgeon: Marissa Nestle, MD;  Location: [facility];  Service: Urology;  Laterality: N/A;  . COLECTOMY N/A 05/04/2017   Procedure: TOTAL COLECTOMY;  Surgeon: Aviva Signs, MD;  Location: [facility];  Service: General;  Laterality: N/A;  . COLONOSCOPY N/A 04/27/2017   Procedure: COLONOSCOPY;  Surgeon: Daneil Dolin, MD;  [facility];  Service: Endoscopy;  Laterality: N/A;  245  . CYSTOSCOPY W/ RETROGRADES Bilateral 06/29/2015   Procedure: CYSTOSCOPY, DILATION OF URETHRAL STRICTURE WITH BILATERAL RETROGRADE PYELOGRAM,SUPRAPUBIC TUBE CHANGE;  Surgeon: Festus Aloe, MD;  Location: [facility];  Service: Urology;  Laterality: Bilateral;  . CYSTOSCOPY WITH URETHRAL DILATATION N/A 12/29/2013   Procedure: CYSTOSCOPY WITH URETHRAL DILATATION;  Surgeon: Marissa Nestle, MD;  [facility];  Service: Urology;  Laterality: N/A;  . ESOPHAGOGASTRODUODENOSCOPY N/A 04/27/2017   Procedure: ESOPHAGOGASTRODUODENOSCOPY (EGD);  Surgeon: Daneil Dolin, MD;  [facility];  Service: Endoscopy;  Laterality: N/A;  . ORIF FEMUR FRACTURE Right 11/22/2016   Procedure: OPEN REDUCTION INTERNAL FIXATION (ORIF) DISTAL FEMUR FRACTURE;  Surgeon: Rod Can, MD;  Location: [place];  Service: Orthopedics;  Laterality: Right;  . PATCH ANGIOPLASTY Right 12/10/2017   Procedure: PATCH ANGIOPLASTY;  Surgeon: Angelia Mould, MD;  Location[facility] OR;  Service: Vascular;  Laterality: Right;  . PERIPHERAL VASCULAR CATHETERIZATION N/A 10/08/2015   Procedure: A/V Shuntogram;  Surgeon: Angelia Mould, MD;  Location: [place] [facility];  Service: Cardiovascular;  Laterality: N/A;  . THROMBECTOMY W/ EMBOLECTOMY Right 12/10/2017   Procedure: THROMBECTOMY REVISION RIGHT ARM  ARTERIOVENOUS FISTULA;  Surgeon: Angelia Mould, MD;  Location: [place];  Service: Vascular;  Laterality: Right;  .  TRANSURETHRAL RESECTION OF PROSTATE N/A 01/04/2014   Procedure: TRANSURETHRAL RESECTION OF THE PROSTATE (TURP) (procedure #2);  Surgeon: Marissa Nestle, MD;  [facility];  Service: Urology;  Laterality: N/A;   Family History  Problem Relation Age of Onset  . Cancer Mother   . Colon cancer Neg Hx    Social History   Socioeconomic History  . Marital status: Single    Spouse name: Not on file  . Number of children: Not on file  . Years of education: Not on file  . Highest education level: Not on file  Occupational History  . Not on file  Social Needs  . Financial resource strain: Not hard at all  . Food insecurity:    Worry: Never true    Inability: Never true  . Transportation needs:    Medical: No    Non-medical: No  Tobacco Use  . Smoking status: Never Smoker  . Smokeless tobacco: Never Used  Substance and  Sexual Activity  . Alcohol use: No  . Drug use: No  . Sexual activity: Never  Lifestyle  . Physical activity:    Days per week: 0 days    Minutes per session: Not on file  . Stress: Not at all  Relationships  . Social connections:    Talks on phone: More than three times a week    Gets together: More than three times a week    Attends religious service: Never    Active member of club or organization: No    Attends meetings of clubs or organizations: Never    Relationship status: Never married  Other Topics Concern  . Not on file  Social History Narrative   Lives at nursing home.    Outpatient Encounter Medications as of 02/18/2018  Medication Sig  . atorvastatin (LIPITOR) 40 MG tablet Take 40 mg by mouth every evening.  . insulin glargine (LANTUS) 100 UNIT/ML injection Inject 5 Units into the skin at bedtime.  Marland Kitchen levothyroxine (SYNTHROID, LEVOTHROID) 75 MCG tablet Take 75 mcg by mouth daily before breakfast.   . linagliptin (TRADJENTA) 5 MG TABS tablet Take 5 mg by mouth every evening.   . midodrine (PROAMATINE) 10 MG tablet Take 0.5 tablets (5 mg  total) by mouth every dialysis (hypotension).  . Multiple Vitamin (MULTIVITAMIN WITH MINERALS) TABS tablet Take 1 tablet by mouth every evening.   . Neo-Bacit-Poly-Lidocaine (FIRST AID PLUS LIDOCAINE EX) Apply 1 application topically Every Tuesday,Thursday,and Saturday with dialysis. Prior to dialysis  . Nutritional Supplements (NEPRO/CARBSTEADY PO) Take 1 Can by mouth 2 (two) times daily.  Marland Kitchen omeprazole (PRILOSEC) 40 MG capsule Take 40 mg by mouth at bedtime.   . sevelamer carbonate (RENVELA) 800 MG tablet Take 3,200 mg by mouth 3 (three) times daily with meals.   . sevelamer carbonate (RENVELA) 800 MG tablet Take 1,600 mg by mouth at bedtime.  . tamsulosin (FLOMAX) 0.4 MG CAPS capsule Take 0.4 mg by mouth every evening. Give 30 minutes after a meal  . torsemide (DEMADEX) 10 MG tablet Take 5 tablets (50 mg total) by mouth daily.   No facility-administered encounter medications on file as of 02/18/2018.     Activities of Daily Living In your present state of health, do you have any difficulty performing the following activities: 02/18/2018 01/20/2018  Hearing? N N  Vision? N N  Difficulty concentrating or making decisions? Tempie Donning  Walking or climbing stairs? Y Y  Dressing or bathing? Y Y  Doing errands, shopping? N N  Preparing Food and eating ? N -  Using the Toilet? N -  In the past six months, have you accidently leaked urine? N -  Do you have problems with loss of bowel control? N -  Managing your Medications? Y -  Managing your Finances? Y -  Housekeeping or managing your Housekeeping? Y -  Some recent data might be hidden    Patient Care Team: [name], MD as PCP - General (Internal Medicine) Cassandria Anger, MD as Consulting Physician (Endocrinology) Fran Lowes, MD as Consulting Physician (Nephrology) Gala Romney Cristopher Estimable, MD as Consulting Physician (Gastroenterology)   Assessment:   This is a routine wellness examination for [place].  Exercise Activities and  Dietary recommendations Current Exercise Habits: Home exercise routine, Type of exercise: walking, Time (Minutes): 20, Frequency (Times/Week): 7, Weekly Exercise (Minutes/Week): 140, Intensity: Mild, Exercise limited by: orthopedic condition(s)  Goals    None      Fall Risk Fall Risk  02/18/2018  Falls in the past year? No   Is the patient's home free of loose throw rugs in walkways, pet beds, electrical cords, etc?   yes      Grab bars in the bathroom? yes      Handrails on the stairs?   yes      Adequate lighting?   yes  Depression Screen PHQ 2/9 Scores 02/18/2018  PHQ - 2 Score 0    Cognitive Function     6CIT Screen 02/18/2018  What Year? 4 points  What month? 3 points  What time? 3 points  Count back from 20 4 points  Months in reverse 4 points  Repeat phrase 10 points  Total Score 28    Immunization History  Administered Date(s) Administered  . Influenza,inj,Quad PF,6+ Mos 12/26/2013, 07/09/2015  . Influenza-Unspecified 07/31/2017  . PPD Test 03/07/2015  . Pneumococcal Conjugate-13 07/31/2017  . Pneumococcal Polysaccharide-23 03/04/2015  . Tdap 08/22/2017    Qualifies for Shingles Vaccine? Not in past records  Screening Tests Health Maintenance  Topic Date Due  . OPHTHALMOLOGY EXAM  02/18/2018 (Originally 08/23/1956)  . URINE MICROALBUMIN  02/18/2018 (Originally 08/23/1956)  . INFLUENZA VACCINE  05/27/2018  . FOOT EXAM  07/22/2018  . HEMOGLOBIN A1C  07/22/2018  . COLONOSCOPY  04/28/2027  . TETANUS/TDAP  08/23/2027  . Hepatitis C Screening  Completed  . PNA vac Low Risk Adult  Completed   Cancer Screenings: Lung: Low Dose CT Chest recommended if Age 31-80 years, 30 pack-year currently smoking OR have quit w/in 15years. Patient does not qualify. Colorectal: up to date  Additional Screenings:  Hepatitis C Screening:declined Diabetic eye exam due-ordered      Plan:    I have personally reviewed and addressed the Medicare Annual Wellness  questionnaire and have noted the following in the patient's chart:  A. Medical and social history B. Use of alcohol, tobacco or illicit drugs  C. Current medications and supplements D. Functional ability and status E.  Nutritional status F.  Physical activity G. Advance directives H. List of other physicians I.  Hospitalizations, surgeries, and ER visits in previous 12 months J.  [place] to include hearing, vision, cognitive, depression L. Referrals and appointments - none  In addition, I have reviewed and discussed with patient certain preventive protocols, quality metrics, and best practice recommendations. A written personalized care plan for preventive services as well as general preventive health recommendations were provided to patient.  See attached scanned questionnaire for additional information.   Signed,   Tyson Dense, RN Nurse Health Advisor  Patient Concerns: None

## 2018-02-18 NOTE — Patient Instructions (Addendum)
Zachary [name] , Thank you for taking time to come for your Medicare Wellness Visit. I appreciate your ongoing commitment to your health goals. Please review the following plan we discussed and let me know if I can assist you in the future.   Screening recommendations/referrals: Colonoscopy up to date, due 04/28/2027 Recommended yearly ophthalmology/optometry visit for glaucoma screening and checkup Recommended yearly dental visit for hygiene and checkup  Vaccinations: Influenza vaccine up to date, due 2019 fall season Pneumococcal vaccine up to date, completed Tdap vaccine up to date, due 08/23/2027 Shingles vaccine not in past records    Advanced directives: need a copy for chart  Conditions/risks identified: none  Next appointment: Dr. Lyndel Safe makes rounds  Preventive Care 68 Years and Older, Male Preventive care refers to lifestyle choices and visits with your health care provider that can promote health and wellness. What does preventive care include?  A yearly physical exam. This is also called an annual well check.  Dental exams once or twice a year.  Routine eye exams. Ask your health care provider how often you should have your eyes checked.  Personal lifestyle choices, including:  Daily care of your teeth and gums.  Regular physical activity.  Eating a healthy diet.  Avoiding tobacco and drug use.  Limiting alcohol use.  Practicing safe sex.  Taking low doses of aspirin every day.  Taking vitamin and mineral supplements as recommended by your health care provider. What happens during an annual well check? The services and screenings done by your health care provider during your annual well check will depend on your age, overall health, lifestyle risk factors, and family history of disease. Counseling  Your health care provider may ask you questions about your:  Alcohol use.  Tobacco use.  Drug use.  Emotional well-being.  Home and relationship  well-being.  Sexual activity.  Eating habits.  History of falls.  Memory and ability to understand (cognition).  Work and work Statistician. Screening  You may have the following tests or measurements:  Height, weight, and BMI.  Blood pressure.  Lipid and cholesterol levels. These may be checked every 5 years, or more frequently if you are over 60 years old.  Skin check.  Lung cancer screening. You may have this screening every year starting at age 70 if you have a 30-pack-year history of smoking and currently smoke or have quit within the past 15 years.  Fecal occult blood test (FOBT) of the stool. You may have this test every year starting at age 47.  Flexible sigmoidoscopy or colonoscopy. You may have a sigmoidoscopy every 5 years or a colonoscopy every 10 years starting at age 41.  Prostate cancer screening. Recommendations will vary depending on your family history and other risks.  Hepatitis C blood test.  Hepatitis B blood test.  Sexually transmitted disease (STD) testing.  Diabetes screening. This is done by checking your blood sugar (glucose) after you have not eaten for a while (fasting). You may have this done every 1-3 years.  Abdominal aortic aneurysm (AAA) screening. You may need this if you are a current or former smoker.  Osteoporosis. You may be screened starting at age 17 if you are at high risk. Talk with your health care provider about your test results, treatment options, and if necessary, the need for more tests. Vaccines  Your health care provider may recommend certain vaccines, such as:  Influenza vaccine. This is recommended every year.  Tetanus, diphtheria, and acellular pertussis (Tdap, Td) vaccine.  You may need a Td booster every 10 years.  Zoster vaccine. You may need this after age 85.  Pneumococcal 13-valent conjugate (PCV13) vaccine. One dose is recommended after age 75.  Pneumococcal polysaccharide (PPSV23) vaccine. One dose is  recommended after age 44. Talk to your health care provider about which screenings and vaccines you need and how often you need them. This information is not intended to replace advice given to you by your health care provider. Make sure you discuss any questions you have with your health care provider. Document Released: 11/09/2015 Document Revised: 07/02/2016 Document Reviewed: 08/14/2015 Elsevier Interactive Patient Education  2017 [place] Prevention in the Home Falls can cause injuries. They can happen to people of all ages. There are many things you can do to make your home safe and to help prevent falls. What can I do on the outside of my home?  Regularly fix the edges of walkways and driveways and fix any cracks.  Remove anything that might make you trip as you walk through a door, such as a raised step or threshold.  Trim any bushes or trees on the path to your home.  Use bright outdoor lighting.  Clear any walking paths of anything that might make someone trip, such as rocks or tools.  Regularly check to see if handrails are loose or broken. Make sure that both sides of any steps have handrails.  Any raised decks and porches should have guardrails on the edges.  Have any leaves, snow, or ice cleared regularly.  Use sand or salt on walking paths during winter.  Clean up any spills in your garage right away. This includes oil or grease spills. What can I do in the bathroom?  Use night lights.  Install grab bars by the toilet and in the tub and shower. Do not use towel bars as grab bars.  Use non-skid mats or decals in the tub or shower.  If you need to sit down in the shower, use a plastic, non-slip stool.  Keep the floor dry. Clean up any water that spills on the floor as soon as it happens.  Remove soap buildup in the tub or shower regularly.  Attach bath mats securely with double-sided non-slip rug tape.  Do not have throw rugs and other things on  the floor that can make you trip. What can I do in the bedroom?  Use night lights.  Make sure that you have a light by your bed that is easy to reach.  Do not use any sheets or blankets that are too big for your bed. They should not hang down onto the floor.  Have a firm chair that has side arms. You can use this for support while you get dressed.  Do not have throw rugs and other things on the floor that can make you trip. What can I do in the kitchen?  Clean up any spills right away.  Avoid walking on wet floors.  Keep items that you use a lot in easy-to-reach places.  If you need to reach something above you, use a strong step stool that has a grab bar.  Keep electrical cords out of the way.  Do not use floor polish or wax that makes floors slippery. If you must use wax, use non-skid floor wax.  Do not have throw rugs and other things on the floor that can make you trip. What can I do with my stairs?  Do not leave any  items on the stairs.  Make sure that there are handrails on both sides of the stairs and use them. Fix handrails that are broken or loose. Make sure that handrails are as long as the stairways.  Check any carpeting to make sure that it is firmly attached to the stairs. Fix any carpet that is loose or worn.  Avoid having throw rugs at the top or bottom of the stairs. If you do have throw rugs, attach them to the floor with carpet tape.  Make sure that you have a light switch at the top of the stairs and the bottom of the stairs. If you do not have them, ask someone to add them for you. What else can I do to help prevent falls?  Wear shoes that:  Do not have high heels.  Have rubber bottoms.  Are comfortable and fit you well.  Are closed at the toe. Do not wear sandals.  If you use a stepladder:  Make sure that it is fully opened. Do not climb a closed stepladder.  Make sure that both sides of the stepladder are locked into place.  Ask someone to  hold it for you, if possible.  Clearly mark and make sure that you can see:  Any grab bars or handrails.  First and last steps.  Where the edge of each step is.  Use tools that help you move around (mobility aids) if they are needed. These include:  Canes.  Walkers.  Scooters.  Crutches.  Turn on the lights when you go into a dark area. Replace any light bulbs as soon as they burn out.  Set up your furniture so you have a clear path. Avoid moving your furniture around.  If any of your floors are uneven, fix them.  If there are any pets around you, be aware of where they are.  Review your medicines with your doctor. Some medicines can make you feel dizzy. This can increase your chance of falling. Ask your doctor what other things that you can do to help prevent falls. This information is not intended to replace advice given to you by your health care provider. Make sure you discuss any questions you have with your health care provider. Document Released: 08/09/2009 Document Revised: 03/20/2016 Document Reviewed: 11/17/2014 Elsevier Interactive Patient Education  2017 [employer].

## 2018-02-20 DIAGNOSIS — D509 Iron deficiency anemia, unspecified: Secondary | ICD-10-CM | POA: Diagnosis not present

## 2018-02-20 DIAGNOSIS — N186 End stage renal disease: Secondary | ICD-10-CM | POA: Diagnosis not present

## 2018-02-20 DIAGNOSIS — N2581 Secondary hyperparathyroidism of renal origin: Secondary | ICD-10-CM | POA: Diagnosis not present

## 2018-02-20 DIAGNOSIS — Z992 Dependence on renal dialysis: Secondary | ICD-10-CM | POA: Diagnosis not present

## 2018-02-23 DIAGNOSIS — D509 Iron deficiency anemia, unspecified: Secondary | ICD-10-CM | POA: Diagnosis not present

## 2018-02-23 DIAGNOSIS — N2581 Secondary hyperparathyroidism of renal origin: Secondary | ICD-10-CM | POA: Diagnosis not present

## 2018-02-23 DIAGNOSIS — Z992 Dependence on renal dialysis: Secondary | ICD-10-CM | POA: Diagnosis not present

## 2018-02-23 DIAGNOSIS — N186 End stage renal disease: Secondary | ICD-10-CM | POA: Diagnosis not present

## 2018-02-25 DIAGNOSIS — D631 Anemia in chronic kidney disease: Secondary | ICD-10-CM | POA: Diagnosis not present

## 2018-02-25 DIAGNOSIS — Z992 Dependence on renal dialysis: Secondary | ICD-10-CM | POA: Diagnosis not present

## 2018-02-25 DIAGNOSIS — N186 End stage renal disease: Secondary | ICD-10-CM | POA: Diagnosis not present

## 2018-02-25 DIAGNOSIS — N2581 Secondary hyperparathyroidism of renal origin: Secondary | ICD-10-CM | POA: Diagnosis not present

## 2018-02-25 DIAGNOSIS — D509 Iron deficiency anemia, unspecified: Secondary | ICD-10-CM | POA: Diagnosis not present

## 2018-02-27 DIAGNOSIS — D631 Anemia in chronic kidney disease: Secondary | ICD-10-CM | POA: Diagnosis not present

## 2018-02-27 DIAGNOSIS — N186 End stage renal disease: Secondary | ICD-10-CM | POA: Diagnosis not present

## 2018-02-27 DIAGNOSIS — D509 Iron deficiency anemia, unspecified: Secondary | ICD-10-CM | POA: Diagnosis not present

## 2018-02-27 DIAGNOSIS — Z992 Dependence on renal dialysis: Secondary | ICD-10-CM | POA: Diagnosis not present

## 2018-02-27 DIAGNOSIS — N2581 Secondary hyperparathyroidism of renal origin: Secondary | ICD-10-CM | POA: Diagnosis not present

## 2018-02-27 IMAGING — CT CT ABD-PELV W/O CM
2 of 4 series · 16 of 46 positions shown, 18 images · non-contrast
Comparison: 09/13/2015

CLINICAL DATA: Right upper quadrant and left upper quadrant
abdominal pain with nausea for 1 week.

EXAM:
CT ABDOMEN AND PELVIS WITHOUT CONTRAST
TECHNIQUE: Multidetector CT imaging of the abdomen and pelvis was performed
following the standard protocol without IV contrast.

[Series 2: abdomen/pelvis w/o contrast · axial · non-contrast · 0.70mm/px · z∈[-706,-262]mm · 13 of 99 slices shown, 15 images]
[im 5/99  soft-tissue]
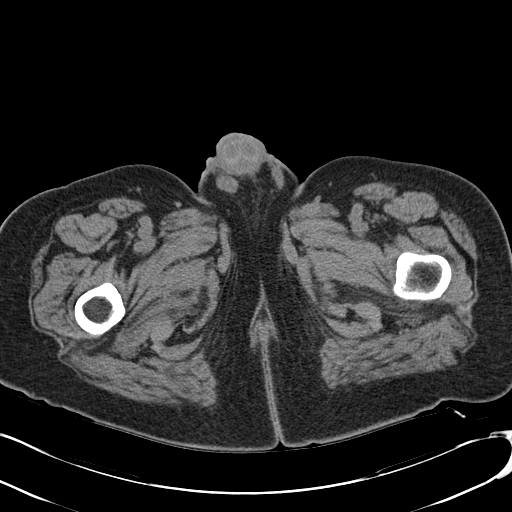
[im 5/99  bone]
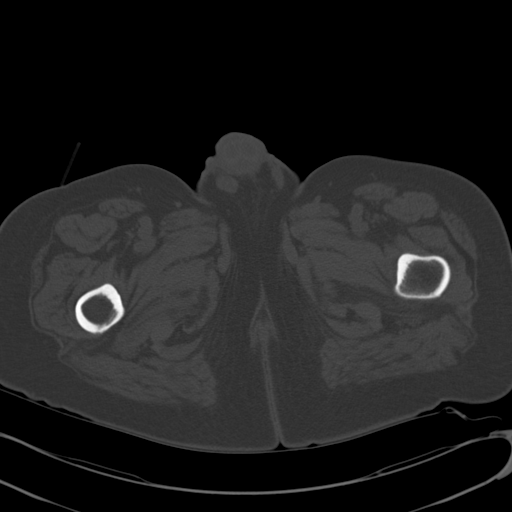
[im 13/99  soft-tissue]
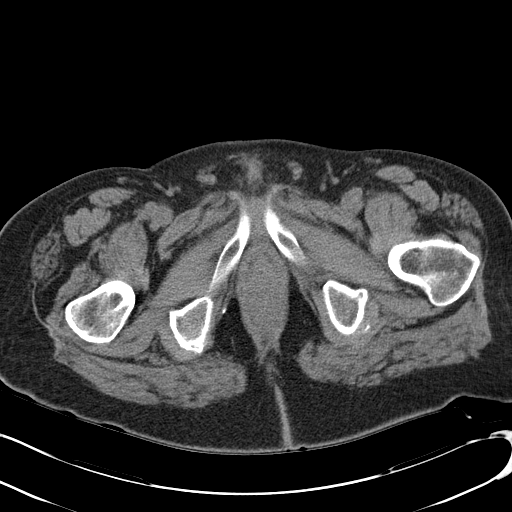
[im 21/99  soft-tissue]
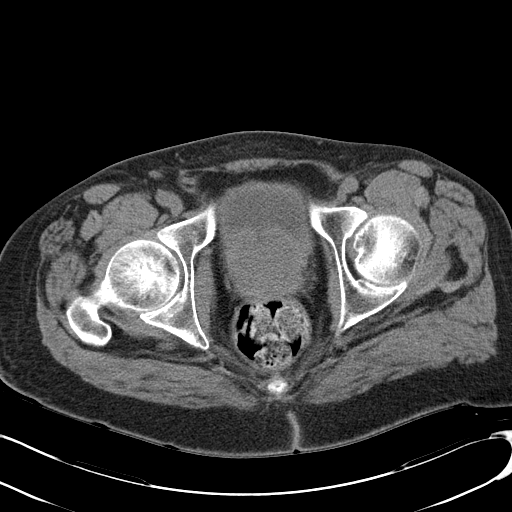
[im 29/99  soft-tissue]
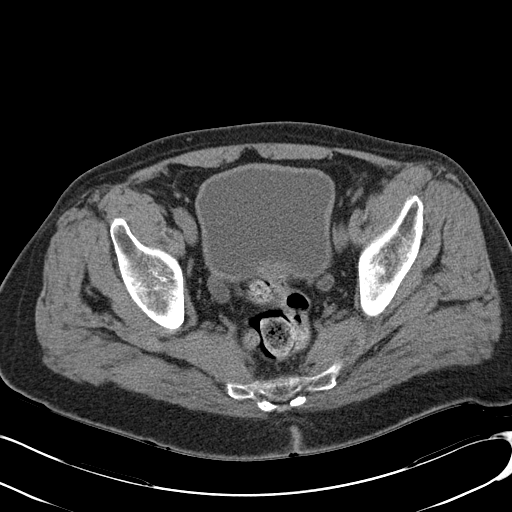
[im 33/99  soft-tissue]
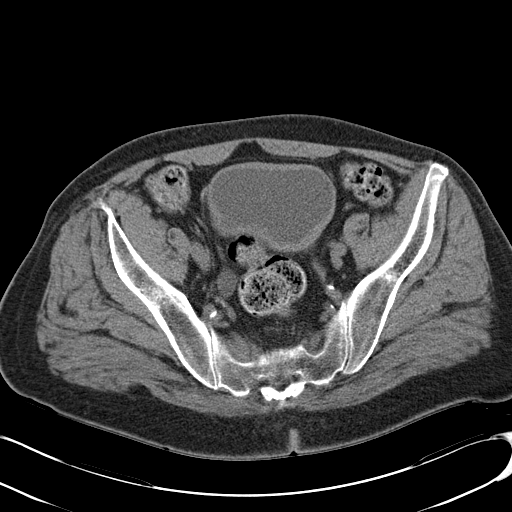
[im 41/99  soft-tissue]
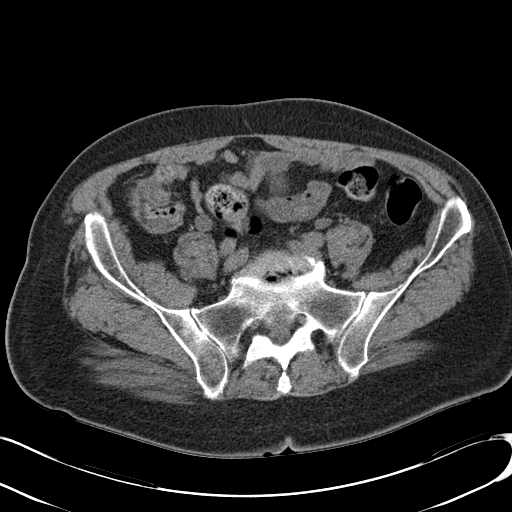
[im 50/99  soft-tissue]
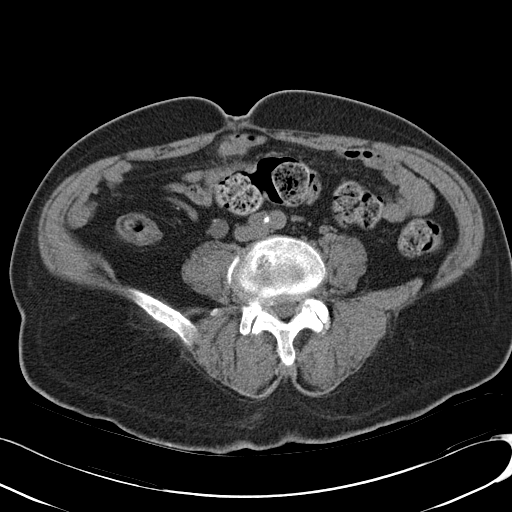
[im 58/99  soft-tissue]
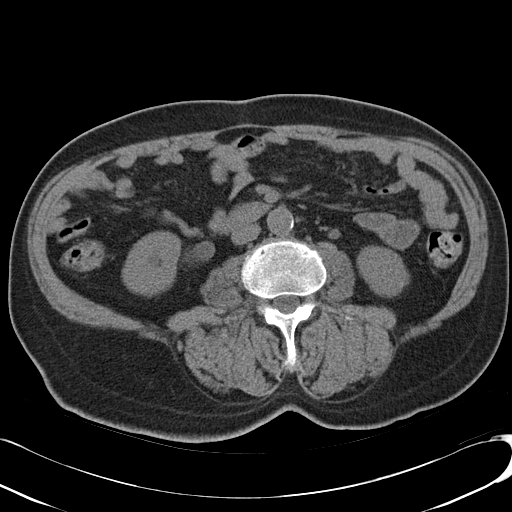
[im 66/99  soft-tissue]
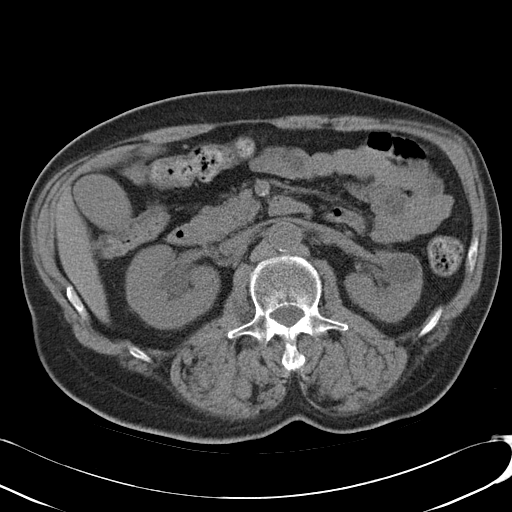
[im 66/99  bone]
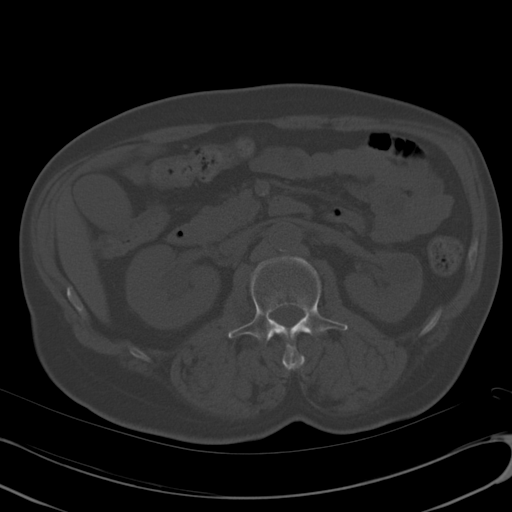
[im 70/99  soft-tissue]
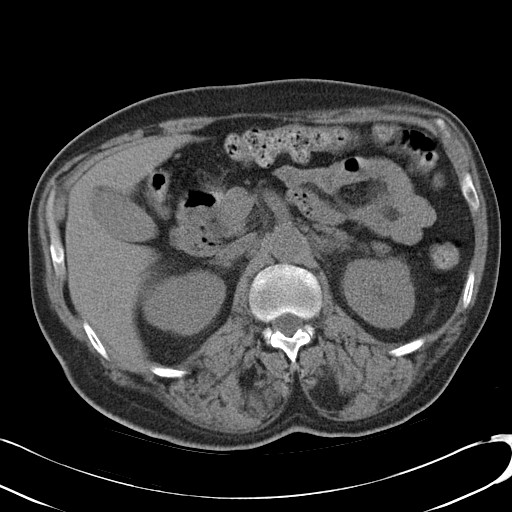
[im 78/99  soft-tissue]
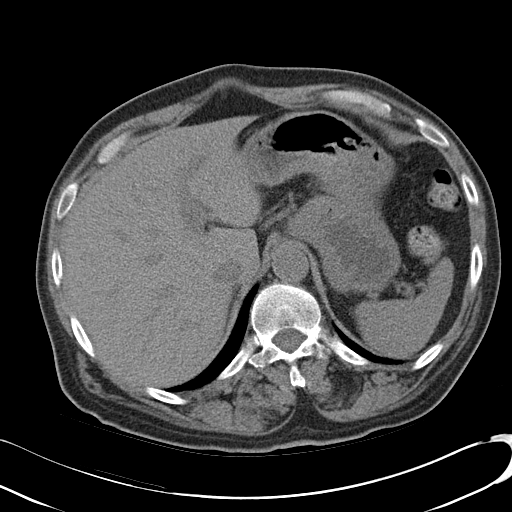
[im 86/99  soft-tissue]
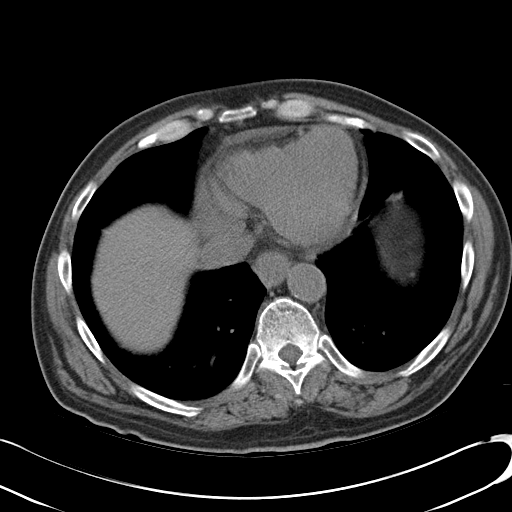
[im 94/99  soft-tissue]
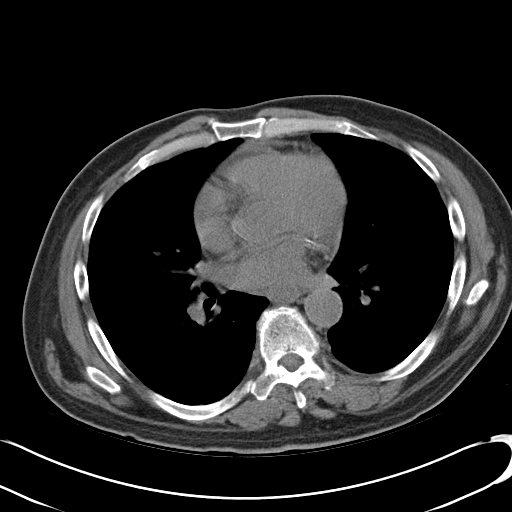

[Series 3: mpr cor (id) · coronal · 0.75mm/px · 3 of 102 slices shown]
[im 34/102  soft-tissue]
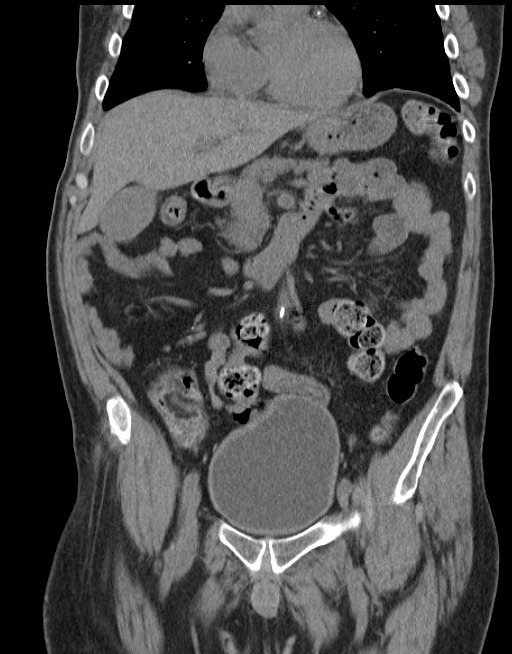
[im 45/102  soft-tissue]
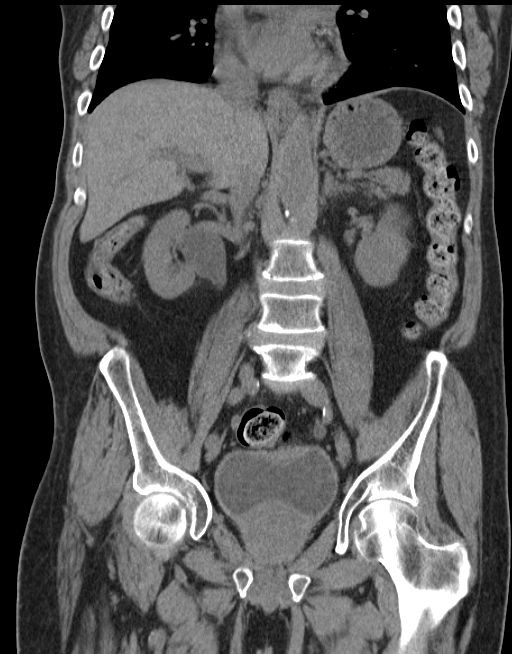
[im 57/102  soft-tissue]
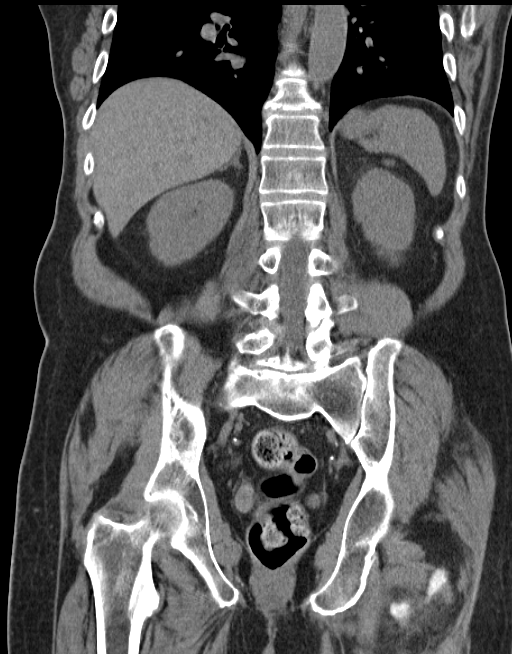

[16 of 46 positions shown; findings below may reference images not displayed]

FINDINGS: Mild dependent changes in the lung bases. Coronary artery
calcifications. Small pericardial effusion.

There is bilateral hydronephrosis and hydroureter with stranding
around the kidneys. No renal, ureteral, or bladder stones are
identified. Bladder wall is diffusely thickened. Prostate gland is
enlarged at 5.2 x 5.5 cm in diameter. Changes could be due to
cystitis and pyelonephritis or bladder outlet obstruction with
bladder wall hypertrophy and reflux.

The unenhanced appearance of the liver, spleen, gallbladder,
pancreas, adrenal glands, abdominal aorta, inferior vena cava, and
retroperitoneal lymph nodes is unremarkable. Stomach, small bowel,
and colon are not abnormally distended. Stool fills the colon. No
free air or free fluid in the abdomen.

Pelvis: Appendix is not identified. No free or loculated pelvic
fluid collections. No pelvic lymphadenopathy. Degenerative changes
in the spine. No destructive bone lesions.
IMPRESSION: Bilateral hydronephrosis and hydroureter. Bladder wall thickening.
Prostate gland enlarged. No stones identified. Changes could be due
to cystitis and pyelonephritis or bladder outlet obstruction with
bladder wall hypertrophy and reflux.

## 2018-03-02 DIAGNOSIS — D631 Anemia in chronic kidney disease: Secondary | ICD-10-CM | POA: Diagnosis not present

## 2018-03-02 DIAGNOSIS — D509 Iron deficiency anemia, unspecified: Secondary | ICD-10-CM | POA: Diagnosis not present

## 2018-03-02 DIAGNOSIS — N186 End stage renal disease: Secondary | ICD-10-CM | POA: Diagnosis not present

## 2018-03-02 DIAGNOSIS — N2581 Secondary hyperparathyroidism of renal origin: Secondary | ICD-10-CM | POA: Diagnosis not present

## 2018-03-02 DIAGNOSIS — Z992 Dependence on renal dialysis: Secondary | ICD-10-CM | POA: Diagnosis not present

## 2018-03-04 DIAGNOSIS — D509 Iron deficiency anemia, unspecified: Secondary | ICD-10-CM | POA: Diagnosis not present

## 2018-03-04 DIAGNOSIS — Z992 Dependence on renal dialysis: Secondary | ICD-10-CM | POA: Diagnosis not present

## 2018-03-04 DIAGNOSIS — N2581 Secondary hyperparathyroidism of renal origin: Secondary | ICD-10-CM | POA: Diagnosis not present

## 2018-03-04 DIAGNOSIS — N186 End stage renal disease: Secondary | ICD-10-CM | POA: Diagnosis not present

## 2018-03-04 DIAGNOSIS — D631 Anemia in chronic kidney disease: Secondary | ICD-10-CM | POA: Diagnosis not present

## 2018-03-06 DIAGNOSIS — N2581 Secondary hyperparathyroidism of renal origin: Secondary | ICD-10-CM | POA: Diagnosis not present

## 2018-03-06 DIAGNOSIS — N186 End stage renal disease: Secondary | ICD-10-CM | POA: Diagnosis not present

## 2018-03-06 DIAGNOSIS — D631 Anemia in chronic kidney disease: Secondary | ICD-10-CM | POA: Diagnosis not present

## 2018-03-06 DIAGNOSIS — Z992 Dependence on renal dialysis: Secondary | ICD-10-CM | POA: Diagnosis not present

## 2018-03-06 DIAGNOSIS — D509 Iron deficiency anemia, unspecified: Secondary | ICD-10-CM | POA: Diagnosis not present

## 2018-03-08 ENCOUNTER — Encounter: Payer: Self-pay | Admitting: Internal Medicine

## 2018-03-08 ENCOUNTER — Non-Acute Institutional Stay (SKILLED_NURSING_FACILITY): Payer: Medicare Other | Admitting: Internal Medicine

## 2018-03-08 DIAGNOSIS — K529 Noninfective gastroenteritis and colitis, unspecified: Secondary | ICD-10-CM

## 2018-03-08 DIAGNOSIS — D638 Anemia in other chronic diseases classified elsewhere: Secondary | ICD-10-CM

## 2018-03-08 DIAGNOSIS — E1121 Type 2 diabetes mellitus with diabetic nephropathy: Secondary | ICD-10-CM | POA: Diagnosis not present

## 2018-03-08 DIAGNOSIS — I82401 Acute embolism and thrombosis of unspecified deep veins of right lower extremity: Secondary | ICD-10-CM | POA: Diagnosis not present

## 2018-03-08 DIAGNOSIS — N186 End stage renal disease: Secondary | ICD-10-CM | POA: Diagnosis not present

## 2018-03-08 DIAGNOSIS — E782 Mixed hyperlipidemia: Secondary | ICD-10-CM | POA: Diagnosis not present

## 2018-03-08 DIAGNOSIS — E039 Hypothyroidism, unspecified: Secondary | ICD-10-CM

## 2018-03-08 NOTE — Progress Notes (Signed)
Location:   [facility] Room Number: 112/D Place of Service:  SNF (31) Provider:  Glennon Hamilton, MD  Patient Care Team: [name], MD as PCP - General (Internal Medicine) Cassandria Anger, MD as Consulting Physician (Endocrinology) Fran Lowes, MD as Consulting Physician (Nephrology) Gala Romney Cristopher Estimable, MD as Consulting Physician (Gastroenterology)  Extended Emergency Contact Information Primary Emergency Contact: Betsey Amen Address: DSS  [place] of [place] Phone: [phone] Work Phone: [phone] Relation: Legal Guardian Secondary Emergency Contact: Farruggia,Richard Address: [address]          [place], [place] [address] [place] Phone: [phone] Relation: Brother  Code Status:  Full Code Goals of care: Advanced Directive information Advanced Directives 03/08/2018  Does Patient Have a Medical Advance Directive? Yes  Type of Advance Directive (No Data)  Does patient want to make changes to medical advance directive? No - Patient declined  Copy of [place] in Chart? No - copy requested  Would patient like information on creating a medical advance directive? No - Patient declined  Pre-existing out of facility DNR order (yellow form or pink MOST form) -    Chief complaint-routine visit for medical management of chronic medical conditions including end-stage renal disease on chronic hemodialysis type 2 diabetes hypothyroidism anemia of chronic disease- history of right femoral fracture status post ORIF-history of right lower extremity DVT---  also recent history of gastroenteritis as noted above.  Acute issue was   HPI:  Pt is a 72 y.o. male seen today for medical management of chronic diseases.  In late March when he presented with abdominal pain vomiting tachycardia and low-grade fever.  CT of the abdomen showed enteritis of the small bowel antibiotics were  stopped secondary to he became afebrile and white count was not elevated he appears to have made a fairly unremarkable recovery there is been no reoccurrence.  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  He appears to be tolerating this fairly well.  He also has a history of a right distal femoral fracture status post ORIF this was complicated with development of a right lower extremity DVT this was further complicated with development of a GI bleed--.  His Lovenox was stopped secondary to finishing duration of treatment for the DVT.  One point he required a colectomy but has made a good recovery his appetite is good weight appears to be stable.  At one point his hemoglobin actually was in the sevens but this has made a nice recovery is now basically at his baseline around 11.  He is on a PPI.  He also is a type II diabetic he is on Tradjenta hemoglobin A1c did rise somewhat up to 7.6 on lab data approximately 6 weeks ago --- he has been started on low-dose Lantus in addition to the [place]-    blood sugars do appear to be somewhat elevated ranging from the higher 100s to low 200s in the morning At noon this also appears to be  comparable-at at bedtime sugars appear to be fairly consistently 2-300 range  Hemoglobin A1c was 8.3 on April lab drawn at dialysis-he is quite noncompliant with his diet and gets food brought in  frequently by family   .  Regards to hypothyroidism is on  Synthroid TSH was within normal limits at 2.197 on January lab.  He also has a history of hyperlipidemia Lipitor was recently increased LDL could not be calculated because of his triglycerides over 400.  Currently has no acute complaints-vital signs appear to be stable at times he will have somewhat low  blood pressures actually has Midodrine  on dialysis days      Past Medical History:  Diagnosis Date  . Abnormal CT scan, kidney 10/06/2011  . Acute pyelonephritis 10/07/2011  . Anemia    normocytic  . Anxiety    mental retardation  . Bladder wall thickening 10/06/2011  . BPH (benign prostatic hypertrophy)   . Diabetes mellitus   . Dialysis patient [facility])    Tuesday, Thursday and Saturday,   . Edema     history of lower extremity edema  . GERD (gastroesophageal reflux disease)   . Heme positive stool   . Hydronephrosis   . Hyperkalemia   . Hyperlipidemia   . Hypernatremia   . Hypertension   . Hypothyroidism   . Impaired speech   . 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 MR (mental retardation)   . Muscle weakness   . Obstructive uropathy   . 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 Poor historian poor historian  . Protein calorie malnutrition ([place])   . Pyelonephritis   . Renal failure (ARF), acute on chronic (HCC)   . Renal insufficiency    chronic history  . Sepsis ([place])   . Smoking   . Uremia   . Urinary retention   . UTI (lower urinary tract infection) 10/06/2011   Past Surgical History:  Procedure Laterality Date  . AV FISTULA PLACEMENT Left 07/06/2015   Procedure:  INSERTION LEFT ARM ARTERIOVENOUS GORTEX GRAFT;  Surgeon: Angelia Mould, MD;  Location: [facility];  Service: Vascular;  Laterality: Left;  . AV FISTULA PLACEMENT Right 02/26/2016   Procedure: ARTERIOVENOUS (AV) FISTULA CREATION ;  Surgeon: Angelia Mould, MD;  Location: [facility];  Service: Vascular;  Laterality: Right;  . [place] REMOVAL Left 10/09/2015   Procedure: REMOVAL OF ARTERIOVENOUS GORETEX GRAFT ([place]) Evacuation of Lymphocele, Vein Patch angioplasty of brachial artery.;  Surgeon: Angelia Mould, MD;  Location: [facility];  Service: Vascular;  Laterality: Left;  . BASCILIC VEIN TRANSPOSITION Right 02/26/2016   Procedure: Right BASCILIC VEIN TRANSPOSITION;  Surgeon: Angelia Mould,  MD;  Location: [facility];  Service: Vascular;  Laterality: Right;  . CIRCUMCISION N/A 01/04/2014   Procedure: CIRCUMCISION ADULT (procedure #1);  Surgeon: Marissa Nestle, MD;  Location: [facility];  Service: Urology;  Laterality: N/A;  . COLECTOMY N/A 05/04/2017   Procedure: TOTAL COLECTOMY;  Surgeon: Aviva Signs, MD;  Location[facility];  Service: General;  Laterality: N/A;  . COLONOSCOPY N/A 04/27/2017   Procedure: COLONOSCOPY;  Surgeon: Daneil Dolin, MD;  [facility];  Service: Endoscopy;  Laterality: N/A;  245  . CYSTOSCOPY W/ RETROGRADES Bilateral 06/29/2015   Procedure: CYSTOSCOPY, DILATION OF URETHRAL STRICTURE WITH BILATERAL RETROGRADE PYELOGRAM,SUPRAPUBIC TUBE CHANGE;  Surgeon: Festus Aloe, MD;  Location: [facility];  Service: Urology;  Laterality: Bilateral;  . CYSTOSCOPY WITH URETHRAL DILATATION N/A 12/29/2013   Procedure: CYSTOSCOPY WITH URETHRAL DILATATION;  Surgeon: Marissa Nestle, MD;  [facility];  Service: Urology;  Laterality: N/A;  . ESOPHAGOGASTRODUODENOSCOPY N/A 04/27/2017   Procedure: ESOPHAGOGASTRODUODENOSCOPY (EGD);  Surgeon: Daneil Dolin, MD;  [facility];  Service: Endoscopy;  Laterality: N/A;  . ORIF FEMUR FRACTURE  Right 11/22/2016   Procedure: OPEN REDUCTION INTERNAL FIXATION (ORIF) DISTAL FEMUR FRACTURE;  Surgeon: Rod Can, MD;  Location: [facility];  Service: Orthopedics;  Laterality: Right;  . PATCH ANGIOPLASTY Right 12/10/2017   Procedure: PATCH ANGIOPLASTY;  Surgeon: Angelia Mould, MD;  [facility];  Service: Vascular;  Laterality: Right;  . PERIPHERAL VASCULAR CATHETERIZATION N/A 10/08/2015   Procedure: A/V Shuntogram;  Surgeon: Angelia Mould, MD;  Location: [facility] CV LAB;  Service: Cardiovascular;  Laterality: N/A;  . THROMBECTOMY W/ EMBOLECTOMY Right 12/10/2017   Procedure: THROMBECTOMY REVISION RIGHT ARM  ARTERIOVENOUS FISTULA;  Surgeon: Angelia Mould, MD;  Location: [facility];  Service: Vascular;   Laterality: Right;  . TRANSURETHRAL RESECTION OF PROSTATE N/A 01/04/2014   Procedure: TRANSURETHRAL RESECTION OF THE PROSTATE (TURP) (procedure #2);  Surgeon: Marissa Nestle, MD;  [facility];  Service: Urology;  Laterality: N/A;    No Known Allergies  Outpatient Encounter Medications as of 03/08/2018  Medication Sig  . atorvastatin (LIPITOR) 40 MG tablet Take 40 mg by mouth every evening.  . insulin glargine (LANTUS) 100 UNIT/ML injection Inject 5 Units into the skin at bedtime.  Marland Kitchen levothyroxine (SYNTHROID, LEVOTHROID) 75 MCG tablet Take 75 mcg by mouth daily before breakfast.   . linagliptin (TRADJENTA) 5 MG TABS tablet Take 5 mg by mouth every evening.   . midodrine (PROAMATINE) 10 MG tablet Take 0.5 tablets (5 mg total) by mouth every dialysis (hypotension).  . Multiple Vitamin (MULTIVITAMIN WITH MINERALS) TABS tablet Take 1 tablet by mouth every evening.   . Neo-Bacit-Poly-Lidocaine (FIRST AID PLUS LIDOCAINE EX) Apply 1 application topically Every Tuesday,Thursday,and Saturday with dialysis. Prior to dialysis  . Nutritional Supplements (NEPRO/CARBSTEADY PO) Take 1 Can by mouth 2 (two) times daily.  Marland Kitchen omeprazole (PRILOSEC) 40 MG capsule Take 40 mg by mouth at bedtime.   . sevelamer carbonate (RENVELA) 800 MG tablet Take 3,200 mg by mouth 3 (three) times daily with meals.   . sevelamer carbonate (RENVELA) 800 MG tablet Take 1,600 mg by mouth at bedtime.  . tamsulosin (FLOMAX) 0.4 MG CAPS capsule Take 0.4 mg by mouth every evening. Give 30 minutes after a meal  . torsemide (DEMADEX) 10 MG tablet Take 5 tablets (50 mg total) by mouth daily.   No facility-administered encounter medications on file as of 03/08/2018.      Review of Systems   This is limited secondary patient being a poor historian.  General is not complaining any fever chills weight appears to have stabilized around 200 pounds.  Skin is not complaining of rashes or itching.  He does have a shunt positive for a  bruit left arm.  Head ears eyes nose mouth and throat is not complaining of any sore throat or visual changes.  Respiratory is not complaining of shortness of breath or cough.  Cardiac does not complain of chest pain or increased lower extremity edema from baseline.  GU has end-stage renal disease on dialysis does not complain of suprapubic pain.  GI does not complain of abdominal pain continues to have a good appetite does not complain of nausea or vomiting diarrhea constipation.  Musculoskeletal does not complain of joint pain currently.  Neurologic does not complain of feeling dizzy although he does have at times somewhat low blood pressure does not complain of syncope.  And psych does have a history some cognitive deficits-question mild mental retardation  Immunization History  Administered Date(s) Administered  . Influenza,inj,Quad PF,6+ Mos 12/26/2013, 07/09/2015  .  Influenza-Unspecified 07/31/2017  . PPD Test 03/07/2015  . Pneumococcal Conjugate-13 07/31/2017  . Pneumococcal Polysaccharide-23 03/04/2015  . Tdap 08/22/2017   Pertinent  Health Maintenance Due  Topic Date Due  . OPHTHALMOLOGY EXAM  04/08/2018 (Originally 08/23/1956)  . URINE MICROALBUMIN  04/08/2018 (Originally 08/23/1956)  . INFLUENZA VACCINE  05/27/2018  . FOOT EXAM  07/22/2018  . HEMOGLOBIN A1C  07/22/2018  . COLONOSCOPY  04/28/2027  . PNA vac Low Risk Adult  Completed   Fall Risk  02/18/2018  Falls in the past year? No   Functional Status Survey:    Vitals:   03/08/18 1403  BP: 105/69  Pulse: 83  Resp: 18  Temp: 97.6 F (36.4 C)  TempSrc: Oral  SpO2: 100%  Weight: 200 lb (90.7 kg)  Height: 5\' 8"  (1.727 m)   Body mass index is 30.41 kg/m. Physical Exam In general this is a pleasantmiddle aged  male in no distress he appears well-developed  His skin is warm and dry he does have a shunt applied left arm with positive bruit   old shunt site right arm appears benign.  Eyes sclera and  conjunctive are clear visual acuity appears grossly intact.  Oropharynx clear mucous membranes moist.  Chest is clear to auscultation there is no labored breathing.  Heart is regular rate and rhythm without murmur gallop or rub he has some mild right lower extremity edema pedal pulses intact as well as capillary refill.  Abdomen is soft nontender with positive bowel sounds.  Musculoskeletal does move all extremities x4 at baseline with some chronic lower extremity weakness.  Neurologic is grossly intact could not really appreciate lateralizing findings his speech is clear.  Psych he is oriented to self pleasant and appropriate follows commands without any difficulty also is able to answer simple questions  Labs reviewed: -February 02, 2018.  Hemoglobin 10.6-.  Potassium 4.5.  Hemoglobin A1c 8.33.    Recent Labs    06/08/17 0519 06/10/17 0727  01/20/18 0628 01/21/18 0825 01/22/18 0945  NA 134* 137   < > 139 136 137  K 3.5 3.2*   < > 4.2 3.7 3.4*  CL 97* 100*   < > 102 94* 94*  CO2 27 26   < > 19* 22 21*  GLUCOSE 96 146*   < > 126* 125* 143*  BUN 50* 34*   < > 93* 44* 61*  CREATININE 5.68* 4.96*   < > 11.08* 7.58* 10.63*  CALCIUM 8.0* 8.4*   < > 8.0* 8.3* 8.2*  PHOS 5.1* 3.7  --   --   --  7.9*   < > = values in this interval not displayed.   Recent Labs    05/18/17 1026  06/03/17 0055  06/10/17 0727 01/19/18 1608 01/22/18 0945  AST 29  --  16  --   --  22  --   ALT 15*  --  15*  --   --  18  --   ALKPHOS 92  --  87  --   --  152*  --   BILITOT 0.9  --  0.5  --   --  0.8  --   PROT 5.3*  --  6.5  --   --  6.8  --   ALBUMIN 2.0*   < > 2.4*   < > 2.1* 3.3* 3.2*   < > = values in this interval not displayed.   Recent Labs    11/14/17 0435  01/19/18 0700  01/19/18 1608 01/20/18 0628 01/22/18 0945  WBC 5.2  --  8.9 7.4 7.9 6.2  NEUTROABS 2.6  --  6.8 5.3  --   --   HGB 10.3*   < > 11.6* 11.0* 10.8* 10.9*  HCT 31.3*   < > 36.3* 34.8* 33.7* 34.3*  MCV 109.1*   --  106.1* 106.4* 106.3* 106.9*  PLT 130*  --  145* 143* 137* 153   < > = values in this interval not displayed.   Lab Results  Component Value Date   TSH 2.197 11/14/2017   Lab Results  Component Value Date   HGBA1C 7.6 (H) 01/19/2018   Lab Results  Component Value Date   CHOL 130 01/19/2018   HDL 22 (L) 01/19/2018   LDLCALC UNABLE TO CALCULATE IF TRIGLYCERIDE OVER 400 mg/dL 01/19/2018   TRIG 520 (H) 01/19/2018   CHOLHDL 5.9 01/19/2018    Significant Diagnostic Results in last 30 days:  No results found.  Assessment/Plan  #1 history of end-stage renal disease on chronic hemodialysis this appears to be relatively stable he is on Demadex with a history of edema this appears relatively well controlled weights are stable.  I do note he is on Midodrine on dialysis days secondary to hypotension.  2.  Type 2 diabetes hemoglobin A1c continues to rise he has been started on low-dose Lantus will increase Lantus dose he continues on Tradjenta as well.--- Will increase Lantus to 8 units and monitor--- this is especially challenging secondary to dietary noncompliance although he has been frequently urged to try to moderate this  3.  History of recent gastroenteritis there is been no evidence of a reoccurrence here he did require hospitalization at one point but this resolved fairly unremarkably.  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  History of anemia with GI bleed in addition of chronic disease-this is stabilized hemoglobin on dialysis lab was 10.6 last month which appears relatively stable he is on a PPI.  5.  History of hypothyroidism TSH was within normal limits in January he is on Synthroid.  6.  History of right lower extremity DVT-again he is completed course of Lovenox this was complicated with GI bleeding- at this point appears to be  stable.  7.  History of right femoral fracture treated surgically with ORIF pain appears to be controlled he does ambulate largely in a wheelchair.  8.  History of hyperlipidemia again LDL was difficult to calculate because of his triglycerides his Lipitor was recently increased.  9.  History of BPH he does continue on Flomax  this has been stable.    [id number] note greater than 35 minutes spent assessing patient-discussing status with nursing staff- and coordinating and formulating a plan of care for numerous diagnoses- of note greater than 50% of time spent coordinating plan of care

## 2018-03-09 DIAGNOSIS — Z992 Dependence on renal dialysis: Secondary | ICD-10-CM | POA: Diagnosis not present

## 2018-03-09 DIAGNOSIS — N2581 Secondary hyperparathyroidism of renal origin: Secondary | ICD-10-CM | POA: Diagnosis not present

## 2018-03-09 DIAGNOSIS — D509 Iron deficiency anemia, unspecified: Secondary | ICD-10-CM | POA: Diagnosis not present

## 2018-03-09 DIAGNOSIS — D631 Anemia in chronic kidney disease: Secondary | ICD-10-CM | POA: Diagnosis not present

## 2018-03-09 DIAGNOSIS — N186 End stage renal disease: Secondary | ICD-10-CM | POA: Diagnosis not present

## 2018-03-11 DIAGNOSIS — N186 End stage renal disease: Secondary | ICD-10-CM | POA: Diagnosis not present

## 2018-03-11 DIAGNOSIS — D631 Anemia in chronic kidney disease: Secondary | ICD-10-CM | POA: Diagnosis not present

## 2018-03-11 DIAGNOSIS — Z992 Dependence on renal dialysis: Secondary | ICD-10-CM | POA: Diagnosis not present

## 2018-03-11 DIAGNOSIS — N2581 Secondary hyperparathyroidism of renal origin: Secondary | ICD-10-CM | POA: Diagnosis not present

## 2018-03-11 DIAGNOSIS — D509 Iron deficiency anemia, unspecified: Secondary | ICD-10-CM | POA: Diagnosis not present

## 2018-03-12 DIAGNOSIS — F70 Mild intellectual disabilities: Secondary | ICD-10-CM | POA: Diagnosis not present

## 2018-03-12 DIAGNOSIS — F4322 Adjustment disorder with anxiety: Secondary | ICD-10-CM | POA: Diagnosis not present

## 2018-03-13 DIAGNOSIS — D631 Anemia in chronic kidney disease: Secondary | ICD-10-CM | POA: Diagnosis not present

## 2018-03-13 DIAGNOSIS — N2581 Secondary hyperparathyroidism of renal origin: Secondary | ICD-10-CM | POA: Diagnosis not present

## 2018-03-13 DIAGNOSIS — Z992 Dependence on renal dialysis: Secondary | ICD-10-CM | POA: Diagnosis not present

## 2018-03-13 DIAGNOSIS — D509 Iron deficiency anemia, unspecified: Secondary | ICD-10-CM | POA: Diagnosis not present

## 2018-03-13 DIAGNOSIS — N186 End stage renal disease: Secondary | ICD-10-CM | POA: Diagnosis not present

## 2018-03-16 DIAGNOSIS — D509 Iron deficiency anemia, unspecified: Secondary | ICD-10-CM | POA: Diagnosis not present

## 2018-03-16 DIAGNOSIS — N186 End stage renal disease: Secondary | ICD-10-CM | POA: Diagnosis not present

## 2018-03-16 DIAGNOSIS — N2581 Secondary hyperparathyroidism of renal origin: Secondary | ICD-10-CM | POA: Diagnosis not present

## 2018-03-16 DIAGNOSIS — Z992 Dependence on renal dialysis: Secondary | ICD-10-CM | POA: Diagnosis not present

## 2018-03-16 DIAGNOSIS — D631 Anemia in chronic kidney disease: Secondary | ICD-10-CM | POA: Diagnosis not present

## 2018-03-18 DIAGNOSIS — D631 Anemia in chronic kidney disease: Secondary | ICD-10-CM | POA: Diagnosis not present

## 2018-03-18 DIAGNOSIS — N186 End stage renal disease: Secondary | ICD-10-CM | POA: Diagnosis not present

## 2018-03-18 DIAGNOSIS — N2581 Secondary hyperparathyroidism of renal origin: Secondary | ICD-10-CM | POA: Diagnosis not present

## 2018-03-18 DIAGNOSIS — Z992 Dependence on renal dialysis: Secondary | ICD-10-CM | POA: Diagnosis not present

## 2018-03-18 DIAGNOSIS — D509 Iron deficiency anemia, unspecified: Secondary | ICD-10-CM | POA: Diagnosis not present

## 2018-03-20 DIAGNOSIS — Z992 Dependence on renal dialysis: Secondary | ICD-10-CM | POA: Diagnosis not present

## 2018-03-20 DIAGNOSIS — D509 Iron deficiency anemia, unspecified: Secondary | ICD-10-CM | POA: Diagnosis not present

## 2018-03-20 DIAGNOSIS — D631 Anemia in chronic kidney disease: Secondary | ICD-10-CM | POA: Diagnosis not present

## 2018-03-20 DIAGNOSIS — N2581 Secondary hyperparathyroidism of renal origin: Secondary | ICD-10-CM | POA: Diagnosis not present

## 2018-03-20 DIAGNOSIS — N186 End stage renal disease: Secondary | ICD-10-CM | POA: Diagnosis not present

## 2018-03-23 DIAGNOSIS — N2581 Secondary hyperparathyroidism of renal origin: Secondary | ICD-10-CM | POA: Diagnosis not present

## 2018-03-23 DIAGNOSIS — D631 Anemia in chronic kidney disease: Secondary | ICD-10-CM | POA: Diagnosis not present

## 2018-03-23 DIAGNOSIS — N186 End stage renal disease: Secondary | ICD-10-CM | POA: Diagnosis not present

## 2018-03-23 DIAGNOSIS — D509 Iron deficiency anemia, unspecified: Secondary | ICD-10-CM | POA: Diagnosis not present

## 2018-03-23 DIAGNOSIS — Z992 Dependence on renal dialysis: Secondary | ICD-10-CM | POA: Diagnosis not present

## 2018-03-25 DIAGNOSIS — Z992 Dependence on renal dialysis: Secondary | ICD-10-CM | POA: Diagnosis not present

## 2018-03-25 DIAGNOSIS — N186 End stage renal disease: Secondary | ICD-10-CM | POA: Diagnosis not present

## 2018-03-25 DIAGNOSIS — D509 Iron deficiency anemia, unspecified: Secondary | ICD-10-CM | POA: Diagnosis not present

## 2018-03-25 DIAGNOSIS — D631 Anemia in chronic kidney disease: Secondary | ICD-10-CM | POA: Diagnosis not present

## 2018-03-25 DIAGNOSIS — N2581 Secondary hyperparathyroidism of renal origin: Secondary | ICD-10-CM | POA: Diagnosis not present

## 2018-03-26 DIAGNOSIS — Z992 Dependence on renal dialysis: Secondary | ICD-10-CM | POA: Diagnosis not present

## 2018-03-26 DIAGNOSIS — N186 End stage renal disease: Secondary | ICD-10-CM | POA: Diagnosis not present

## 2018-03-27 DIAGNOSIS — D509 Iron deficiency anemia, unspecified: Secondary | ICD-10-CM | POA: Diagnosis not present

## 2018-03-27 DIAGNOSIS — N186 End stage renal disease: Secondary | ICD-10-CM | POA: Diagnosis not present

## 2018-03-27 DIAGNOSIS — Z992 Dependence on renal dialysis: Secondary | ICD-10-CM | POA: Diagnosis not present

## 2018-03-27 DIAGNOSIS — D631 Anemia in chronic kidney disease: Secondary | ICD-10-CM | POA: Diagnosis not present

## 2018-03-27 DIAGNOSIS — N2581 Secondary hyperparathyroidism of renal origin: Secondary | ICD-10-CM | POA: Diagnosis not present

## 2018-03-30 DIAGNOSIS — N2581 Secondary hyperparathyroidism of renal origin: Secondary | ICD-10-CM | POA: Diagnosis not present

## 2018-03-30 DIAGNOSIS — N186 End stage renal disease: Secondary | ICD-10-CM | POA: Diagnosis not present

## 2018-03-30 DIAGNOSIS — D509 Iron deficiency anemia, unspecified: Secondary | ICD-10-CM | POA: Diagnosis not present

## 2018-03-30 DIAGNOSIS — D631 Anemia in chronic kidney disease: Secondary | ICD-10-CM | POA: Diagnosis not present

## 2018-03-30 DIAGNOSIS — Z992 Dependence on renal dialysis: Secondary | ICD-10-CM | POA: Diagnosis not present

## 2018-04-01 DIAGNOSIS — D509 Iron deficiency anemia, unspecified: Secondary | ICD-10-CM | POA: Diagnosis not present

## 2018-04-01 DIAGNOSIS — N2581 Secondary hyperparathyroidism of renal origin: Secondary | ICD-10-CM | POA: Diagnosis not present

## 2018-04-01 DIAGNOSIS — N186 End stage renal disease: Secondary | ICD-10-CM | POA: Diagnosis not present

## 2018-04-01 DIAGNOSIS — D631 Anemia in chronic kidney disease: Secondary | ICD-10-CM | POA: Diagnosis not present

## 2018-04-01 DIAGNOSIS — Z992 Dependence on renal dialysis: Secondary | ICD-10-CM | POA: Diagnosis not present

## 2018-04-03 DIAGNOSIS — D631 Anemia in chronic kidney disease: Secondary | ICD-10-CM | POA: Diagnosis not present

## 2018-04-03 DIAGNOSIS — N186 End stage renal disease: Secondary | ICD-10-CM | POA: Diagnosis not present

## 2018-04-03 DIAGNOSIS — N2581 Secondary hyperparathyroidism of renal origin: Secondary | ICD-10-CM | POA: Diagnosis not present

## 2018-04-03 DIAGNOSIS — D509 Iron deficiency anemia, unspecified: Secondary | ICD-10-CM | POA: Diagnosis not present

## 2018-04-03 DIAGNOSIS — Z992 Dependence on renal dialysis: Secondary | ICD-10-CM | POA: Diagnosis not present

## 2018-04-06 DIAGNOSIS — N186 End stage renal disease: Secondary | ICD-10-CM | POA: Diagnosis not present

## 2018-04-06 DIAGNOSIS — Z992 Dependence on renal dialysis: Secondary | ICD-10-CM | POA: Diagnosis not present

## 2018-04-06 DIAGNOSIS — D509 Iron deficiency anemia, unspecified: Secondary | ICD-10-CM | POA: Diagnosis not present

## 2018-04-06 DIAGNOSIS — D631 Anemia in chronic kidney disease: Secondary | ICD-10-CM | POA: Diagnosis not present

## 2018-04-06 DIAGNOSIS — N2581 Secondary hyperparathyroidism of renal origin: Secondary | ICD-10-CM | POA: Diagnosis not present

## 2018-04-08 DIAGNOSIS — D509 Iron deficiency anemia, unspecified: Secondary | ICD-10-CM | POA: Diagnosis not present

## 2018-04-08 DIAGNOSIS — N2581 Secondary hyperparathyroidism of renal origin: Secondary | ICD-10-CM | POA: Diagnosis not present

## 2018-04-08 DIAGNOSIS — D631 Anemia in chronic kidney disease: Secondary | ICD-10-CM | POA: Diagnosis not present

## 2018-04-08 DIAGNOSIS — Z992 Dependence on renal dialysis: Secondary | ICD-10-CM | POA: Diagnosis not present

## 2018-04-08 DIAGNOSIS — N186 End stage renal disease: Secondary | ICD-10-CM | POA: Diagnosis not present

## 2018-04-10 DIAGNOSIS — D631 Anemia in chronic kidney disease: Secondary | ICD-10-CM | POA: Diagnosis not present

## 2018-04-10 DIAGNOSIS — D509 Iron deficiency anemia, unspecified: Secondary | ICD-10-CM | POA: Diagnosis not present

## 2018-04-10 DIAGNOSIS — N2581 Secondary hyperparathyroidism of renal origin: Secondary | ICD-10-CM | POA: Diagnosis not present

## 2018-04-10 DIAGNOSIS — Z992 Dependence on renal dialysis: Secondary | ICD-10-CM | POA: Diagnosis not present

## 2018-04-10 DIAGNOSIS — N186 End stage renal disease: Secondary | ICD-10-CM | POA: Diagnosis not present

## 2018-04-12 DIAGNOSIS — Z992 Dependence on renal dialysis: Secondary | ICD-10-CM | POA: Diagnosis not present

## 2018-04-12 DIAGNOSIS — D509 Iron deficiency anemia, unspecified: Secondary | ICD-10-CM | POA: Diagnosis not present

## 2018-04-12 DIAGNOSIS — N186 End stage renal disease: Secondary | ICD-10-CM | POA: Diagnosis not present

## 2018-04-12 DIAGNOSIS — N2581 Secondary hyperparathyroidism of renal origin: Secondary | ICD-10-CM | POA: Diagnosis not present

## 2018-04-12 DIAGNOSIS — D631 Anemia in chronic kidney disease: Secondary | ICD-10-CM | POA: Diagnosis not present

## 2018-04-14 DIAGNOSIS — N2581 Secondary hyperparathyroidism of renal origin: Secondary | ICD-10-CM | POA: Diagnosis not present

## 2018-04-14 DIAGNOSIS — D631 Anemia in chronic kidney disease: Secondary | ICD-10-CM | POA: Diagnosis not present

## 2018-04-14 DIAGNOSIS — D509 Iron deficiency anemia, unspecified: Secondary | ICD-10-CM | POA: Diagnosis not present

## 2018-04-14 DIAGNOSIS — Z992 Dependence on renal dialysis: Secondary | ICD-10-CM | POA: Diagnosis not present

## 2018-04-14 DIAGNOSIS — N186 End stage renal disease: Secondary | ICD-10-CM | POA: Diagnosis not present

## 2018-04-16 DIAGNOSIS — Z992 Dependence on renal dialysis: Secondary | ICD-10-CM | POA: Diagnosis not present

## 2018-04-16 DIAGNOSIS — D509 Iron deficiency anemia, unspecified: Secondary | ICD-10-CM | POA: Diagnosis not present

## 2018-04-16 DIAGNOSIS — N186 End stage renal disease: Secondary | ICD-10-CM | POA: Diagnosis not present

## 2018-04-16 DIAGNOSIS — N2581 Secondary hyperparathyroidism of renal origin: Secondary | ICD-10-CM | POA: Diagnosis not present

## 2018-04-16 DIAGNOSIS — D631 Anemia in chronic kidney disease: Secondary | ICD-10-CM | POA: Diagnosis not present

## 2018-04-19 DIAGNOSIS — D509 Iron deficiency anemia, unspecified: Secondary | ICD-10-CM | POA: Diagnosis not present

## 2018-04-19 DIAGNOSIS — N2581 Secondary hyperparathyroidism of renal origin: Secondary | ICD-10-CM | POA: Diagnosis not present

## 2018-04-19 DIAGNOSIS — Z992 Dependence on renal dialysis: Secondary | ICD-10-CM | POA: Diagnosis not present

## 2018-04-19 DIAGNOSIS — N186 End stage renal disease: Secondary | ICD-10-CM | POA: Diagnosis not present

## 2018-04-19 DIAGNOSIS — D631 Anemia in chronic kidney disease: Secondary | ICD-10-CM | POA: Diagnosis not present

## 2018-04-20 ENCOUNTER — Encounter: Payer: Self-pay | Admitting: Internal Medicine

## 2018-04-20 ENCOUNTER — Non-Acute Institutional Stay (SKILLED_NURSING_FACILITY): Payer: Medicare Other | Admitting: Internal Medicine

## 2018-04-20 DIAGNOSIS — E039 Hypothyroidism, unspecified: Secondary | ICD-10-CM

## 2018-04-20 DIAGNOSIS — Z9049 Acquired absence of other specified parts of digestive tract: Secondary | ICD-10-CM | POA: Diagnosis not present

## 2018-04-20 DIAGNOSIS — E1121 Type 2 diabetes mellitus with diabetic nephropathy: Secondary | ICD-10-CM | POA: Diagnosis not present

## 2018-04-20 DIAGNOSIS — D638 Anemia in other chronic diseases classified elsewhere: Secondary | ICD-10-CM | POA: Diagnosis not present

## 2018-04-20 DIAGNOSIS — N186 End stage renal disease: Secondary | ICD-10-CM | POA: Diagnosis not present

## 2018-04-20 DIAGNOSIS — E782 Mixed hyperlipidemia: Secondary | ICD-10-CM | POA: Diagnosis not present

## 2018-04-20 NOTE — Progress Notes (Signed)
Location:   [place] Room Number: 112/D Place of Service:  SNF (31) Provider:  Geralyn Flash, MD  Patient Care Team: [name], MD as PCP - General (Internal Medicine) Cassandria Anger, MD as Consulting Physician (Endocrinology) Fran Lowes, MD as Consulting Physician (Nephrology) Gala Romney Cristopher Estimable, MD as Consulting Physician (Gastroenterology)  Extended Emergency Contact Information Primary Emergency Contact: Betsey Amen Address: DSS  [place] Phone: [phone] Work Phone: [phone] Relation: Legal Guardian Secondary Emergency Contact: Turner,Richard Address: [address]          [place], [place] [address] [place] Phone: [phone] Relation: Brother  Code Status:  Full Code Goals of care: Advanced Directive information Advanced Directives 03/08/2018  Does Patient Have a Medical Advance Directive? Yes  Type of Advance Directive (No Data)  Does patient want to make changes to medical advance directive? No - Patient declined  Copy of [place] in Chart? No - copy requested  Would patient like information on creating a medical advance directive? No - Patient declined  Pre-existing out of facility DNR order (yellow form or pink MOST form) -     Chief Complaint  Patient presents with  . Medical Management of Chronic Issues    Routine Visit    HPI:  Pt is a 72 y.o. male seen today for medical management of chronic diseases.   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  His weight has stabilized at 204 pounds He did not have any new nursing issues.  No new complaints  Past Medical History:  Diagnosis Date  .  Abnormal CT scan, kidney 10/06/2011  . Acute pyelonephritis 10/07/2011  . Anemia    normocytic  . Anxiety    mental retardation  . Bladder wall thickening 10/06/2011  . BPH (benign prostatic hypertrophy)   . Diabetes mellitus   . Dialysis patient [facility])    Tuesday, Thursday and Saturday,   . Edema     history of lower extremity edema  . GERD (gastroesophageal reflux disease)   . Heme positive stool   . Hydronephrosis   . Hyperkalemia   . Hyperlipidemia   . Hypernatremia   . Hypertension   . Hypothyroidism   . Impaired speech   . 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 MR (mental retardation)   . Muscle weakness   . Obstructive uropathy   . 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 Poor historian poor historian  . Protein calorie malnutrition ([place])   . Pyelonephritis   . Renal failure (ARF), acute on chronic (HCC)   . Renal insufficiency    chronic history  . Sepsis ([place])   . Smoking   . Uremia   . Urinary retention   . UTI (lower urinary tract infection) 10/06/2011   Past Surgical History:  Procedure Laterality Date  . AV FISTULA PLACEMENT Left 07/06/2015   Procedure:  INSERTION LEFT ARM ARTERIOVENOUS GORTEX GRAFT;  Surgeon: Angelia Mould, MD;  Location: [place];  Service: Vascular;  Laterality: Left;  . AV FISTULA PLACEMENT Right 02/26/2016   Procedure: ARTERIOVENOUS (AV) FISTULA CREATION ;  Surgeon: Angelia Mould, MD;  Location: [place];  Service: Vascular;  Laterality: Right;  . [place] REMOVAL Left 10/09/2015  Procedure: REMOVAL OF ARTERIOVENOUS GORETEX GRAFT ([place]) Evacuation of Lymphocele, Vein Patch angioplasty of brachial artery.;  Surgeon: Angelia Mould, MD;  Location: [place];  Service: Vascular;  Laterality: Left;  . BASCILIC VEIN TRANSPOSITION Right 02/26/2016   Procedure: Right BASCILIC VEIN TRANSPOSITION;  Surgeon: Angelia Mould, MD;  Location: [place];  Service: Vascular;  Laterality: Right;  . CIRCUMCISION N/A 01/04/2014   Procedure:  CIRCUMCISION ADULT (procedure #1);  Surgeon: Marissa Nestle, MD;  Location: [facility];  Service: Urology;  Laterality: N/A;  . COLECTOMY N/A 05/04/2017   Procedure: TOTAL COLECTOMY;  Surgeon: Aviva Signs, MD;  Location: [facility];  Service: General;  Laterality: N/A;  . COLONOSCOPY N/A 04/27/2017   Procedure: COLONOSCOPY;  Surgeon: Daneil Dolin, MD;  [facility];  Service: Endoscopy;  Laterality: N/A;  245  . CYSTOSCOPY W/ RETROGRADES Bilateral 06/29/2015   Procedure: CYSTOSCOPY, DILATION OF URETHRAL STRICTURE WITH BILATERAL RETROGRADE PYELOGRAM,SUPRAPUBIC TUBE CHANGE;  Surgeon: Festus Aloe, MD;  [facility];  Service: Urology;  Laterality: Bilateral;  . CYSTOSCOPY WITH URETHRAL DILATATION N/A 12/29/2013   Procedure: CYSTOSCOPY WITH URETHRAL DILATATION;  Surgeon: Marissa Nestle, MD;  [facility];  Service: Urology;  Laterality: N/A;  . ESOPHAGOGASTRODUODENOSCOPY N/A 04/27/2017   Procedure: ESOPHAGOGASTRODUODENOSCOPY (EGD);  Surgeon: Daneil Dolin, MD;  [facility];  Service: Endoscopy;  Laterality: N/A;  . ORIF FEMUR FRACTURE Right 11/22/2016   Procedure: OPEN REDUCTION INTERNAL FIXATION (ORIF) DISTAL FEMUR FRACTURE;  Surgeon: Rod Can, MD;  Location: [place];  Service: Orthopedics;  Laterality: Right;  . PATCH ANGIOPLASTY Right 12/10/2017   Procedure: PATCH ANGIOPLASTY;  Surgeon: Angelia Mould, MD;  Location: [facility] OR;  Service: Vascular;  Laterality: Right;  . PERIPHERAL VASCULAR CATHETERIZATION N/A 10/08/2015   Procedure: A/V Shuntogram;  Surgeon: Angelia Mould, MD;  Location: [place] CV LAB;  Service: Cardiovascular;  Laterality: N/A;  . THROMBECTOMY W/ EMBOLECTOMY Right 12/10/2017   Procedure: THROMBECTOMY REVISION RIGHT ARM  ARTERIOVENOUS FISTULA;  Surgeon: Angelia Mould, MD;  Location: [place];  Service: Vascular;  Laterality: Right;  . TRANSURETHRAL RESECTION OF PROSTATE N/A 01/04/2014   Procedure: TRANSURETHRAL RESECTION OF THE  PROSTATE (TURP) (procedure #2);  Surgeon: Marissa Nestle, MD;  [facility]: [facility];  Service: Urology;  Laterality: N/A;    No Known Allergies  Outpatient Encounter Medications as of 04/20/2018  Medication Sig  . atorvastatin (LIPITOR) 40 MG tablet Take 40 mg by mouth every evening.  . insulin glargine (LANTUS) 100 UNIT/ML injection Inject 8 Units into the skin at bedtime.   Marland Kitchen levothyroxine (SYNTHROID, LEVOTHROID) 75 MCG tablet Take 75 mcg by mouth daily before breakfast.   . linagliptin (TRADJENTA) 5 MG TABS tablet Take 5 mg by mouth every evening.   . midodrine (PROAMATINE) 10 MG tablet Take 0.5 tablets (5 mg total) by mouth every dialysis (hypotension).  . Multiple Vitamin (MULTIVITAMIN WITH MINERALS) TABS tablet Take 1 tablet by mouth every evening.   . Nutritional Supplements (NEPRO/CARBSTEADY PO) Take 1 Can by mouth 2 (two) times daily.  Marland Kitchen omeprazole (PRILOSEC) 40 MG capsule Take 40 mg by mouth at bedtime.   . polyethylene glycol (MIRALAX / GLYCOLAX) packet Take 17 g by mouth as needed.  . sevelamer carbonate (RENVELA) 800 MG tablet Take 3,200 mg by mouth 3 (three) times daily with meals.   . sevelamer carbonate (RENVELA) 800 MG tablet Take 1,600 mg by mouth at bedtime.  . tamsulosin (FLOMAX) 0.4 MG CAPS capsule  Take 0.4 mg by mouth every evening. Give 30 minutes after a meal  . torsemide (DEMADEX) 10 MG tablet Take 5 tablets (50 mg total) by mouth daily.  . [DISCONTINUED] Neo-Bacit-Poly-Lidocaine (FIRST AID PLUS LIDOCAINE EX) Apply 1 application topically Every Tuesday,Thursday,and Saturday with dialysis. Prior to dialysis   No facility-administered encounter medications on file as of 04/20/2018.      Review of Systems  Review of Systems  Constitutional: Negative for activity change, appetite change, chills, diaphoresis, fatigue and fever.  HENT: Negative for mouth sores, postnasal drip, rhinorrhea, sinus pain and sore throat.   Respiratory: Negative for apnea, cough, chest  tightness, shortness of breath and wheezing.   Cardiovascular: Negative for chest pain, palpitations and leg swelling.  Gastrointestinal: Negative for abdominal distention, abdominal pain, constipation, diarrhea, nausea and vomiting.  Genitourinary: Negative for dysuria and frequency.  Musculoskeletal: Negative for arthralgias, joint swelling and myalgias.  Skin: Negative for rash.  Neurological: Negative for dizziness, syncope, weakness, light-headedness and numbness.  Psychiatric/Behavioral: Negative for behavioral problems, confusion and sleep disturbance.     Immunization History  Administered Date(s) Administered  . Influenza,inj,Quad PF,6+ Mos 12/26/2013, 07/09/2015  . Influenza-Unspecified 07/31/2017  . PPD Test 03/07/2015  . Pneumococcal Conjugate-13 07/31/2017  . Pneumococcal Polysaccharide-23 03/04/2015  . Tdap 08/22/2017   Pertinent  Health Maintenance Due  Topic Date Due  . OPHTHALMOLOGY EXAM  08/23/1956  . URINE MICROALBUMIN  08/[birth year]/1958  . INFLUENZA VACCINE  05/27/2018  . FOOT EXAM  07/22/2018  . HEMOGLOBIN A1C  07/22/2018  . COLONOSCOPY  04/28/2027  . PNA vac Low Risk Adult  Completed   Fall Risk  02/18/2018  Falls in the past year? No   Functional Status Survey:    Vitals:   04/20/18 0932  BP: 108/72  Pulse: 87  Resp: 20  Temp: (!) 97 F (36.1 C)  TempSrc: Oral  SpO2: 94%  Weight: 204 lb 6.4 oz (92.7 kg)  Height: 5\' 8"  (1.727 m)   Body mass index is 31.08 kg/m. Physical Exam  Constitutional: He appears well-developed and well-nourished.  HENT:  Head: Normocephalic.  Mouth/Throat: Oropharynx is clear and moist.  Eyes: Pupils are equal, round, and reactive to light.  Neck: Neck supple.  Cardiovascular: Normal rate and normal heart sounds.  Pulmonary/Chest: Effort normal and breath sounds normal. No respiratory distress. He has no wheezes.  Abdominal: Soft. Bowel sounds are normal. He exhibits no distension. There is no tenderness. There is no  rebound.  Musculoskeletal: He exhibits no edema.  Neurological: He is alert.  Skin: Skin is warm and dry.  Psychiatric: He has a normal mood and affect. His behavior is normal. Thought content normal.    Labs reviewed: Recent Labs    06/08/17 0519 06/10/17 0727  01/20/18 0628 01/21/18 0825 01/22/18 0945  NA 134* 137   < > 139 136 137  K 3.5 3.2*   < > 4.2 3.7 3.4*  CL 97* 100*   < > 102 94* 94*  CO2 27 26   < > 19* 22 21*  GLUCOSE 96 146*   < > 126* 125* 143*  BUN 50* 34*   < > 93* 44* 61*  CREATININE 5.68* 4.96*   < > 11.08* 7.58* 10.63*  CALCIUM 8.0* 8.4*   < > 8.0* 8.3* 8.2*  PHOS 5.1* 3.7  --   --   --  7.9*   < > = values in this interval not displayed.   Recent Labs    05/18/17 1026  06/03/17 0055  06/10/17 0727 01/19/18 1608 01/22/18 0945  AST 29  --  16  --   --  22  --   ALT 15*  --  15*  --   --  18  --   ALKPHOS 92  --  87  --   --  152*  --   BILITOT 0.9  --  0.5  --   --  0.8  --   PROT 5.3*  --  6.5  --   --  6.8  --   ALBUMIN 2.0*   < > 2.4*   < > 2.1* 3.3* 3.2*   < > = values in this interval not displayed.   Recent Labs    11/14/17 0435  01/19/18 0700 01/19/18 1608 01/20/18 0628 01/22/18 0945  WBC 5.2  --  8.9 7.4 7.9 6.2  NEUTROABS 2.6  --  6.8 5.3  --   --   HGB 10.3*   < > 11.6* 11.0* 10.8* 10.9*  HCT 31.3*   < > 36.3* 34.8* 33.7* 34.3*  MCV 109.1*  --  106.1* 106.4* 106.3* 106.9*  PLT 130*  --  145* 143* 137* 153   < > = values in this interval not displayed.   Lab Results  Component Value Date   TSH 2.197 11/14/2017   Lab Results  Component Value Date   HGBA1C 7.6 (H) 01/19/2018   Lab Results  Component Value Date   CHOL 130 01/19/2018   HDL 22 (L) 01/19/2018   LDLCALC UNABLE TO CALCULATE IF TRIGLYCERIDE OVER 400 mg/dL 01/19/2018   TRIG 520 (H) 01/19/2018   CHOLHDL 5.9 01/19/2018    Significant Diagnostic Results in last 30 days:  No results found.  Assessment/Plan Diabetes Mellitus Patient BS are still above 200.    Will increase his Lantus to 10 units Continue Accu Check. Also On Tradjenta Repeat A1C Hyperlipidemia  Lipitor.was increased Last visit Will repeat Lipid Profile   ESRD On Dialysis Gets Midodrine for His low BP on Dialysis day  S/P colectomy Appetite has improved and is doing well with his weight  Benign prostatic hyperplasia  On Flomax  Iron deficiency anemia, On Nepro Hgb Stable Hypothyroid Continue same dose of Synthroid TSH normal in 01/19 Repeat TSH [place] abdominal CT scan showed in 03/19 14 mm low-density/cystic pancreatic head lesion. Follow-up pancreatic protocol abdominal CT is recommended in 2 years.  Patient need In House Diabetic Eye Exam Foot Exam Was done by Podiatry in 04/19   Family/ staff Communication:   Labs/tests ordered:

## 2018-04-21 ENCOUNTER — Encounter (HOSPITAL_COMMUNITY)
Admission: RE | Admit: 2018-04-21 | Discharge: 2018-04-21 | Disposition: A | Discharge status: Home / Self Care | Payer: Medicare Other | Source: Skilled Nursing Facility | Attending: Internal Medicine | Admitting: Internal Medicine

## 2018-04-21 DIAGNOSIS — N2581 Secondary hyperparathyroidism of renal origin: Secondary | ICD-10-CM | POA: Diagnosis not present

## 2018-04-21 DIAGNOSIS — E1129 Type 2 diabetes mellitus with other diabetic kidney complication: Secondary | ICD-10-CM | POA: Insufficient documentation

## 2018-04-21 DIAGNOSIS — N186 End stage renal disease: Secondary | ICD-10-CM | POA: Diagnosis not present

## 2018-04-21 DIAGNOSIS — Z992 Dependence on renal dialysis: Secondary | ICD-10-CM | POA: Diagnosis not present

## 2018-04-21 DIAGNOSIS — D509 Iron deficiency anemia, unspecified: Secondary | ICD-10-CM | POA: Diagnosis not present

## 2018-04-21 DIAGNOSIS — D631 Anemia in chronic kidney disease: Secondary | ICD-10-CM | POA: Diagnosis not present

## 2018-04-21 DIAGNOSIS — D500 Iron deficiency anemia secondary to blood loss (chronic): Secondary | ICD-10-CM | POA: Insufficient documentation

## 2018-04-21 DIAGNOSIS — Z9049 Acquired absence of other specified parts of digestive tract: Secondary | ICD-10-CM | POA: Insufficient documentation

## 2018-04-22 ENCOUNTER — Encounter (HOSPITAL_COMMUNITY)
Admission: RE | Admit: 2018-04-22 | Discharge: 2018-04-22 | Disposition: A | Discharge status: Home / Self Care | Payer: Medicare Other | Source: Skilled Nursing Facility | Attending: Internal Medicine | Admitting: Internal Medicine

## 2018-04-22 DIAGNOSIS — N186 End stage renal disease: Secondary | ICD-10-CM | POA: Insufficient documentation

## 2018-04-22 DIAGNOSIS — D500 Iron deficiency anemia secondary to blood loss (chronic): Secondary | ICD-10-CM | POA: Insufficient documentation

## 2018-04-22 DIAGNOSIS — E1129 Type 2 diabetes mellitus with other diabetic kidney complication: Secondary | ICD-10-CM | POA: Diagnosis not present

## 2018-04-22 LAB — BASIC METABOLIC PANEL
Anion gap: 11 (ref 5–15)
BUN: 51 mg/dL — ABNORMAL HIGH (ref 8–23)
CO2: 25 mmol/L (ref 22–32)
Calcium: 8.9 mg/dL (ref 8.9–10.3)
Chloride: 101 mmol/L (ref 98–111)
Creatinine, Ser: 9.14 mg/dL — ABNORMAL HIGH (ref 0.61–1.24)
GFR calc Af Amer: 6 mL/min — ABNORMAL LOW (ref >60)
GFR calc non Af Amer: 5 mL/min — ABNORMAL LOW (ref >60)
Glucose, Bld: 271 mg/dL — ABNORMAL HIGH (ref 70–99)
Potassium: 4.3 mmol/L (ref 3.5–5.1)
Sodium: 137 mmol/L (ref 135–145)

## 2018-04-22 LAB — CBC WITH DIFFERENTIAL/PLATELET
Basophils Absolute: 0 10*3/uL (ref 0.0–0.1)
Basophils Relative: 0 %
Eosinophils Absolute: 0.1 10*3/uL (ref 0.0–0.7)
Eosinophils Relative: 2 %
HCT: 36 % — ABNORMAL LOW (ref 39.0–52.0)
Hemoglobin: 11.7 g/dL — ABNORMAL LOW (ref 13.0–17.0)
Lymphocytes Relative: 33 %
Lymphs Abs: 2.2 10*3/uL (ref 0.7–4.0)
MCH: 34.3 pg — ABNORMAL HIGH (ref 26.0–34.0)
MCHC: 32.5 g/dL (ref 30.0–36.0)
MCV: 105.6 fL — ABNORMAL HIGH (ref 78.0–100.0)
Monocytes Absolute: 0.8 10*3/uL (ref 0.1–1.0)
Monocytes Relative: 12 %
Neutro Abs: 3.5 10*3/uL (ref 1.7–7.7)
Neutrophils Relative %: 53 %
Platelets: 142 10*3/uL — ABNORMAL LOW (ref 150–400)
RBC: 3.41 MIL/uL — ABNORMAL LOW (ref 4.22–5.81)
RDW: 14.7 % (ref 11.5–15.5)
WBC: 6.6 10*3/uL (ref 4.0–10.5)

## 2018-04-22 LAB — LIPID PANEL
Cholesterol: 89 mg/dL (ref 0–200)
HDL: 23 mg/dL — ABNORMAL LOW (ref >40)
Total CHOL/HDL Ratio: 3.9 RATIO
Triglycerides: 396 mg/dL — ABNORMAL HIGH (ref <150)
VLDL: 79 mg/dL — ABNORMAL HIGH (ref 0–40)

## 2018-04-22 LAB — HEMOGLOBIN A1C
Hgb A1c MFr Bld: 8 % — ABNORMAL HIGH (ref 4.8–5.6)
Mean Plasma Glucose: 182.9 mg/dL

## 2018-04-22 LAB — TSH: TSH: 5.265 u[IU]/mL — ABNORMAL HIGH (ref 0.350–4.500)

## 2018-04-23 DIAGNOSIS — D631 Anemia in chronic kidney disease: Secondary | ICD-10-CM | POA: Diagnosis not present

## 2018-04-23 DIAGNOSIS — Z992 Dependence on renal dialysis: Secondary | ICD-10-CM | POA: Diagnosis not present

## 2018-04-23 DIAGNOSIS — N186 End stage renal disease: Secondary | ICD-10-CM | POA: Diagnosis not present

## 2018-04-23 DIAGNOSIS — D509 Iron deficiency anemia, unspecified: Secondary | ICD-10-CM | POA: Diagnosis not present

## 2018-04-23 DIAGNOSIS — N2581 Secondary hyperparathyroidism of renal origin: Secondary | ICD-10-CM | POA: Diagnosis not present

## 2018-04-25 DIAGNOSIS — Z992 Dependence on renal dialysis: Secondary | ICD-10-CM | POA: Diagnosis not present

## 2018-04-25 DIAGNOSIS — N186 End stage renal disease: Secondary | ICD-10-CM | POA: Diagnosis not present

## 2018-04-26 ENCOUNTER — Encounter (HOSPITAL_COMMUNITY): Payer: Self-pay

## 2018-04-26 ENCOUNTER — Ambulatory Visit (HOSPITAL_COMMUNITY)
Admit: 2018-04-26 | Discharge: 2018-04-26 | Disposition: A | Discharge status: Home / Self Care | Payer: Medicare Other | Source: Ambulatory Visit | Attending: Nephrology | Admitting: Nephrology

## 2018-04-26 ENCOUNTER — Other Ambulatory Visit: Payer: Self-pay | Admitting: Radiology

## 2018-04-26 DIAGNOSIS — N186 End stage renal disease: Secondary | ICD-10-CM | POA: Diagnosis not present

## 2018-04-26 DIAGNOSIS — T82868A Thrombosis of vascular prosthetic devices, implants and grafts, initial encounter: Secondary | ICD-10-CM | POA: Diagnosis not present

## 2018-04-26 DIAGNOSIS — F419 Anxiety disorder, unspecified: Secondary | ICD-10-CM | POA: Diagnosis not present

## 2018-04-26 DIAGNOSIS — Z794 Long term (current) use of insulin: Secondary | ICD-10-CM | POA: Diagnosis not present

## 2018-04-26 DIAGNOSIS — Y832 Surgical operation with anastomosis, bypass or graft as the cause of abnormal reaction of the patient, or of later complication, without mention of misadventure at the time of the procedure: Secondary | ICD-10-CM | POA: Diagnosis not present

## 2018-04-26 DIAGNOSIS — N400 Enlarged prostate without lower urinary tract symptoms: Secondary | ICD-10-CM | POA: Insufficient documentation

## 2018-04-26 DIAGNOSIS — T82858A Stenosis of vascular prosthetic devices, implants and grafts, initial encounter: Secondary | ICD-10-CM | POA: Insufficient documentation

## 2018-04-26 DIAGNOSIS — I120 Hypertensive chronic kidney disease with stage 5 chronic kidney disease or end stage renal disease: Secondary | ICD-10-CM | POA: Diagnosis not present

## 2018-04-26 DIAGNOSIS — E039 Hypothyroidism, unspecified: Secondary | ICD-10-CM | POA: Insufficient documentation

## 2018-04-26 DIAGNOSIS — E1122 Type 2 diabetes mellitus with diabetic chronic kidney disease: Secondary | ICD-10-CM | POA: Insufficient documentation

## 2018-04-26 DIAGNOSIS — E785 Hyperlipidemia, unspecified: Secondary | ICD-10-CM | POA: Insufficient documentation

## 2018-04-26 DIAGNOSIS — K219 Gastro-esophageal reflux disease without esophagitis: Secondary | ICD-10-CM | POA: Diagnosis not present

## 2018-04-26 DIAGNOSIS — F79 Unspecified intellectual disabilities: Secondary | ICD-10-CM | POA: Insufficient documentation

## 2018-04-26 HISTORY — PX: IR US GUIDE VASC ACCESS RIGHT: IMG2390

## 2018-04-26 HISTORY — PX: IR THROMBECTOMY AV FISTULA W/THROMBOLYSIS/PTA INC/SHUNT/IMG RIGHT: IMG6119

## 2018-04-26 LAB — BASIC METABOLIC PANEL
Anion gap: 14 (ref 5–15)
BUN: 62 mg/dL — ABNORMAL HIGH (ref 8–23)
CO2: 21 mmol/L — ABNORMAL LOW (ref 22–32)
Calcium: 8.6 mg/dL — ABNORMAL LOW (ref 8.9–10.3)
Chloride: 102 mmol/L (ref 98–111)
Creatinine, Ser: 11.66 mg/dL — ABNORMAL HIGH (ref 0.61–1.24)
GFR calc Af Amer: 4 mL/min — ABNORMAL LOW (ref >60)
GFR calc non Af Amer: 4 mL/min — ABNORMAL LOW (ref >60)
Glucose, Bld: 165 mg/dL — ABNORMAL HIGH (ref 70–99)
Potassium: 4.8 mmol/L (ref 3.5–5.1)
Sodium: 137 mmol/L (ref 135–145)

## 2018-04-26 LAB — CBC
HCT: 33.7 % — ABNORMAL LOW (ref 39.0–52.0)
Hemoglobin: 11 g/dL — ABNORMAL LOW (ref 13.0–17.0)
MCH: 34.3 pg — ABNORMAL HIGH (ref 26.0–34.0)
MCHC: 32.6 g/dL (ref 30.0–36.0)
MCV: 105 fL — ABNORMAL HIGH (ref 78.0–100.0)
Platelets: 125 10*3/uL — ABNORMAL LOW (ref 150–400)
RBC: 3.21 MIL/uL — ABNORMAL LOW (ref 4.22–5.81)
RDW: 14.5 % (ref 11.5–15.5)
WBC: 8.7 10*3/uL (ref 4.0–10.5)

## 2018-04-26 LAB — PROTIME-INR
INR: 1.04
Prothrombin Time: 13.5 seconds (ref 11.4–15.2)

## 2018-04-26 LAB — GLUCOSE, CAPILLARY
Glucose-Capillary: 175 mg/dL — ABNORMAL HIGH (ref 70–99)
Glucose-Capillary: 121 mg/dL — ABNORMAL HIGH (ref 70–99)

## 2018-04-26 MED ORDER — FENTANYL CITRATE (PF) 100 MCG/2ML IJ SOLN
INTRAMUSCULAR | Status: AC
  Filled 2018-04-26: qty 4

## 2018-04-26 MED ORDER — ALTEPLASE 2 MG IJ SOLR
INTRAMUSCULAR | Status: AC | PRN
  Administered 2018-04-26: 2 mg

## 2018-04-26 MED ORDER — MIDAZOLAM HCL 2 MG/2ML IJ SOLN
INTRAMUSCULAR | Status: AC | PRN
  Administered 2018-04-26 (×7): 0.5 mg via INTRAVENOUS

## 2018-04-26 MED ORDER — ALTEPLASE 2 MG IJ SOLR
INTRAMUSCULAR | Status: AC
  Filled 2018-04-26: qty 2

## 2018-04-26 MED ORDER — LIDOCAINE HCL 1 % IJ SOLN
INTRAMUSCULAR | Status: AC
  Filled 2018-04-26: qty 20

## 2018-04-26 MED ORDER — MIDAZOLAM HCL 2 MG/2ML IJ SOLN
INTRAMUSCULAR | Status: AC
  Filled 2018-04-26: qty 4

## 2018-04-26 MED ORDER — HEPARIN SODIUM (PORCINE) 1000 UNIT/ML IJ SOLN
INTRAMUSCULAR | Status: AC | PRN
  Administered 2018-04-26: 4000 [IU] via INTRAVENOUS

## 2018-04-26 MED ORDER — IOPAMIDOL (ISOVUE-300) INJECTION 61%
INTRAVENOUS | Status: AC
  Filled 2018-04-26: qty 100

## 2018-04-26 MED ORDER — HEPARIN SODIUM (PORCINE) 1000 UNIT/ML IJ SOLN
INTRAMUSCULAR | Status: AC
  Filled 2018-04-26: qty 1

## 2018-04-26 MED ORDER — SODIUM CHLORIDE 0.9 % IV SOLN: INTRAVENOUS | Status: DC

## 2018-04-26 MED ORDER — FENTANYL CITRATE (PF) 100 MCG/2ML IJ SOLN
INTRAMUSCULAR | Status: AC | PRN
  Administered 2018-04-26 (×7): 25 ug via INTRAVENOUS

## 2018-04-26 NOTE — H&P (Signed)
Chief Complaint: Patient was seen in consultation today for right upper arm dialysis access thrombolysis at the request of [place]  Referring Physician(s): Fran Lowes  Supervising Physician: Sandi Mariscal  Patient Status: [facility] - Out-pt  History of Present Illness: Zachary [name] is a 72 y.o. male   Right upper arm dialysis access. Not sure of date of placement Revision and embolectomy 11/2017- Dr Rosalia Hammers  Follows with Dr Lowanda Foster Last attempt at dialysis was this am-- unable to cannulate + pulse; no thrill Previous dialysis without issue  No record of IR intervention noted    Past Medical History:  Diagnosis Date  . Abnormal CT scan, kidney 10/06/2011  . Acute pyelonephritis 10/07/2011  . Anemia    normocytic  . Anxiety    mental retardation  . Bladder wall thickening 10/06/2011  . BPH (benign prostatic hypertrophy)   . Diabetes mellitus   . Dialysis patient [facility])    Tuesday, Thursday and Saturday,   . Edema     history of lower extremity edema  . GERD (gastroesophageal reflux disease)   . Heme positive stool   . Hydronephrosis   . Hyperkalemia   . Hyperlipidemia   . Hypernatremia   . Hypertension   . Hypothyroidism   . Impaired speech   . 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 MR (mental retardation)   . Muscle weakness   . Obstructive uropathy   . 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 Poor historian poor historian  . Protein calorie malnutrition ([place])   . Pyelonephritis   . Renal failure (ARF), acute on chronic (HCC)   . Renal insufficiency    chronic history  . Sepsis ([place])   . Smoking   . Uremia   . Urinary retention   . UTI (lower urinary tract infection) 10/06/2011    Past Surgical History:  Procedure Laterality Date  . AV FISTULA PLACEMENT Left 07/06/2015   Procedure:  INSERTION LEFT ARM ARTERIOVENOUS GORTEX GRAFT;  Surgeon: Angelia Mould, MD;  Location: [place];  Service: Vascular;  Laterality: Left;  . AV  FISTULA PLACEMENT Right 02/26/2016   Procedure: ARTERIOVENOUS (AV) FISTULA CREATION ;  Surgeon: Angelia Mould, MD;  Location: [place];  Service: Vascular;  Laterality: Right;  . [place] REMOVAL Left 10/09/2015   Procedure: REMOVAL OF ARTERIOVENOUS GORETEX GRAFT ([place]) Evacuation of Lymphocele, Vein Patch angioplasty of brachial artery.;  Surgeon: Angelia Mould, MD;  Location: [place];  Service: Vascular;  Laterality: Left;  . BASCILIC VEIN TRANSPOSITION Right 02/26/2016   Procedure: Right BASCILIC VEIN TRANSPOSITION;  Surgeon: Angelia Mould, MD;  Location: [place];  Service: Vascular;  Laterality: Right;  . CIRCUMCISION N/A 01/04/2014   Procedure: CIRCUMCISION ADULT (procedure #1);  Surgeon: Marissa Nestle, MD;  [facility];  Service: Urology;  Laterality: N/A;  . COLECTOMY N/A 05/04/2017   Procedure: TOTAL COLECTOMY;  Surgeon: Aviva Signs, MD;  Location: [facility];  Service: General;  Laterality: N/A;  . COLONOSCOPY N/A 04/27/2017   Procedure: COLONOSCOPY;  Surgeon: Daneil Dolin, MD;  [facility];  Service: Endoscopy;  Laterality: N/A;  245  . CYSTOSCOPY W/ RETROGRADES Bilateral 06/29/2015   Procedure: CYSTOSCOPY, DILATION OF URETHRAL STRICTURE WITH BILATERAL RETROGRADE PYELOGRAM,SUPRAPUBIC TUBE CHANGE;  Surgeon: Festus Aloe, MD;  [facility];  Service: Urology;  Laterality: Bilateral;  . CYSTOSCOPY WITH URETHRAL DILATATION N/A 12/29/2013   Procedure: CYSTOSCOPY WITH URETHRAL DILATATION;  Surgeon: Marissa Nestle, MD;  [facility];  Service:  Urology;  Laterality: N/A;  . ESOPHAGOGASTRODUODENOSCOPY N/A 04/27/2017   Procedure: ESOPHAGOGASTRODUODENOSCOPY (EGD);  Surgeon: Daneil Dolin, MD;  [facility];  Service: Endoscopy;  Laterality: N/A;  . ORIF FEMUR FRACTURE Right 11/22/2016   Procedure: OPEN REDUCTION INTERNAL FIXATION (ORIF) DISTAL FEMUR FRACTURE;  Surgeon: Rod Can, MD;  Location: [place];  Service: Orthopedics;  Laterality: Right;  .  PATCH ANGIOPLASTY Right 12/10/2017   Procedure: PATCH ANGIOPLASTY;  Surgeon: Angelia Mould, MD;  Location: [facility] OR;  Service: Vascular;  Laterality: Right;  . PERIPHERAL VASCULAR CATHETERIZATION N/A 10/08/2015   Procedure: A/V Shuntogram;  Surgeon: Angelia Mould, MD;  Location: [place] CV LAB;  Service: Cardiovascular;  Laterality: N/A;  . THROMBECTOMY W/ EMBOLECTOMY Right 12/10/2017   Procedure: THROMBECTOMY REVISION RIGHT ARM  ARTERIOVENOUS FISTULA;  Surgeon: Angelia Mould, MD;  Location: [place];  Service: Vascular;  Laterality: Right;  . TRANSURETHRAL RESECTION OF PROSTATE N/A 01/04/2014   Procedure: TRANSURETHRAL RESECTION OF THE PROSTATE (TURP) (procedure #2);  Surgeon: Marissa Nestle, MD;  [facility];  Service: Urology;  Laterality: N/A;    Allergies: Patient has no known allergies.  Medications: Prior to Admission medications   Medication Sig Start Date End Date Taking? Authorizing Provider  atorvastatin (LIPITOR) 40 MG tablet Take 40 mg by mouth every evening.   Yes [provider]  Insulin Glargine (BASAGLAR KWIKPEN) 100 UNIT/ML SOPN Inject 10 Units into the skin at bedtime.   Yes [provider]  linagliptin (TRADJENTA) 5 MG TABS tablet Take 5 mg by mouth every evening.    Yes [provider]  midodrine (PROAMATINE) 10 MG tablet Take 0.5 tablets (5 mg total) by mouth every dialysis (hypotension). Patient taking differently: Take 5 mg by mouth every Monday, Wednesday, and Friday with hemodialysis.  01/22/18  Yes Phillips Grout, MD  Multiple Vitamin (MULTIVITAMIN WITH MINERALS) TABS tablet Take 1 tablet by mouth every evening.    Yes [provider]  Nutritional Supplements (NEPRO/CARBSTEADY PO) Take 1 Can by mouth 2 (two) times daily.   Yes [provider]  omeprazole (PRILOSEC) 40 MG capsule Take 40 mg by mouth at bedtime.    Yes [provider]  polyethylene glycol (MIRALAX / GLYCOLAX) packet  Take 17 g by mouth as needed for mild constipation.    Yes [provider]  sevelamer carbonate (RENVELA) 800 MG tablet Take 1,600-3,200 mg by mouth See admin instructions. Take 4 tablets (3200mg ) by mouth 3 times daily with meals, and 2 tablets (1600mg ) by mouth with bedtime snack   Yes [provider]  tamsulosin (FLOMAX) 0.4 MG CAPS capsule Take 0.4 mg by mouth every evening. Give 30 minutes after a meal   Yes [provider]  torsemide (DEMADEX) 10 MG tablet Take 5 tablets (50 mg total) by mouth daily. 04/07/16  Yes [name], MD     Family History  Problem Relation Age of Onset  . Cancer Mother   . Colon cancer Neg Hx     Social History   Socioeconomic History  . Marital status: Single    Spouse name: Not on file  . Number of children: Not on file  . Years of education: Not on file  . Highest education level: Not on file  Occupational History  . Not on file  Social Needs  . Financial resource strain: Not hard at all  . Food insecurity:    Worry: Never true    Inability: Never true  .  Transportation needs:    Medical: No    Non-medical: No  Tobacco Use  . Smoking status: Never Smoker  . Smokeless tobacco: Never Used  Substance and Sexual Activity  . Alcohol use: No  . Drug use: No  . Sexual activity: Never  Lifestyle  . Physical activity:    Days per week: 0 days    Minutes per session: Not on file  . Stress: Not at all  Relationships  . Social connections:    Talks on phone: More than three times a week    Gets together: More than three times a week    Attends religious service: Never    Active member of club or organization: No    Attends meetings of clubs or organizations: Never    Relationship status: Never married  Other Topics Concern  . Not on file  Social History Narrative   Lives at nursing home.    Review of Systems: A 12 point ROS discussed and pertinent positives are indicated in the HPI above.  All other systems  are negative.  Review of Systems  Constitutional: Negative for activity change and fever.  Respiratory: Negative for cough and shortness of breath.   Psychiatric/Behavioral: Positive for confusion and decreased concentration. Negative for behavioral problems.    Vital Signs: BP 131/62   Pulse 92   Temp 98.7 F (37.1 C) (Oral)   Resp 18   SpO2 100%   Physical Exam  Cardiovascular: Normal rate and regular rhythm.  Pulmonary/Chest: Effort normal and breath sounds normal.  Abdominal: Soft. Bowel sounds are normal.  Musculoskeletal: Normal range of motion.  Right arm dialysis access +pulses No thrill Tender to touch  Neurological: He is alert.  Skin: Skin is warm and dry.  Psychiatric: He has a normal mood and affect. His behavior is normal.  Consented with Brother Richard via phone  Nursing note and vitals reviewed.   Imaging: No results found.  Labs:  CBC: Recent Labs    01/19/18 1608 01/20/18 0628 01/22/18 0945 04/22/18 0718  WBC 7.4 7.9 6.2 6.6  HGB 11.0* 10.8* 10.9* 11.7*  HCT 34.8* 33.7* 34.3* 36.0*  PLT 143* 137* 153 142*    COAGS: Recent Labs    04/30/17 0630 05/01/17 0440  INR 1.09 1.22    BMP: Recent Labs    01/20/18 0628 01/21/18 0825 01/22/18 0945 04/22/18 0718  NA 139 136 137 137  K 4.2 3.7 3.4* 4.3  CL 102 94* 94* 101  CO2 19* 22 21* 25  GLUCOSE 126* 125* 143* 271*  BUN 93* 44* 61* 51*  CALCIUM 8.0* 8.3* 8.2* 8.9  CREATININE 11.08* 7.58* 10.63* 9.14*  GFRNONAA 4* 6* 4* 5*  GFRAA 5* 7* 5* 6*    LIVER FUNCTION TESTS: Recent Labs    05/17/17 1558 05/18/17 1026  06/03/17 0055  06/08/17 0519 06/10/17 0727 01/19/18 1608 01/22/18 0945  BILITOT 1.0 0.9  --  0.5  --   --   --  0.8  --   AST 36 29  --  16  --   --   --  22  --   ALT 17 15*  --  15*  --   --   --  18  --   ALKPHOS 102 92  --  87  --   --   --  152*  --   PROT 5.9* 5.3*  --  6.5  --   --   --  6.8  --  ALBUMIN 2.3* 2.0*   < > 2.4*   < > 2.0* 2.1* 3.3* 3.2*   <  > = values in this interval not displayed.    TUMOR MARKERS: No results for input(s): AFPTM, CEA, CA199, CHROMGRNA in the last 8760 hours.  Assessment and Plan:  Right upper arm dialysis access clotted Scheduled now for thrombolysis with possible angioplasty/stent placement Possible tunneled dialysis catheter placement Risks and benefits discussed with the patient's brother Richard via phone including, but not limited to bleeding, infection, vascular injury, pulmonary embolism, need for tunneled HD catheter placement or even death.  All of his questions were answered, he is agreeable to proceed. Consent signed and in chart.    Thank you for this interesting consult.  I greatly enjoyed meeting Zachary [name] and look forward to participating in their care.  A copy of this report was sent to the requesting provider on this date.  Electronically Signed: Lavonia Drafts, PA-C 04/26/2018, 2:02 PM   I spent a total of  30 Minutes   in face to face in clinical consultation, greater than 50% of which was counseling/coordinating care for right arm dialysis access declot

## 2018-04-26 NOTE — Discharge Instructions (Signed)

## 2018-04-26 NOTE — Progress Notes (Signed)
Right upper arm fistula with good bruit and thrill and right radial pulse 3+

## 2018-04-26 NOTE — Procedures (Signed)
Pre procedural Dx: ESRD Post procedural Dx: Same.   Technically successful declot of right upper arm AVF.   Access is ready for immediate use.  EBL: Minimal  Complications: None immediate   Ronny Bacon, MD Pager #: [phone]

## 2018-04-27 DIAGNOSIS — N186 End stage renal disease: Secondary | ICD-10-CM | POA: Diagnosis not present

## 2018-04-27 DIAGNOSIS — N2581 Secondary hyperparathyroidism of renal origin: Secondary | ICD-10-CM | POA: Diagnosis not present

## 2018-04-27 DIAGNOSIS — Z992 Dependence on renal dialysis: Secondary | ICD-10-CM | POA: Diagnosis not present

## 2018-04-27 DIAGNOSIS — D631 Anemia in chronic kidney disease: Secondary | ICD-10-CM | POA: Diagnosis not present

## 2018-04-27 DIAGNOSIS — D509 Iron deficiency anemia, unspecified: Secondary | ICD-10-CM | POA: Diagnosis not present

## 2018-04-28 DIAGNOSIS — N2581 Secondary hyperparathyroidism of renal origin: Secondary | ICD-10-CM | POA: Diagnosis not present

## 2018-04-28 DIAGNOSIS — N186 End stage renal disease: Secondary | ICD-10-CM | POA: Diagnosis not present

## 2018-04-28 DIAGNOSIS — D509 Iron deficiency anemia, unspecified: Secondary | ICD-10-CM | POA: Diagnosis not present

## 2018-04-28 DIAGNOSIS — D631 Anemia in chronic kidney disease: Secondary | ICD-10-CM | POA: Diagnosis not present

## 2018-04-28 DIAGNOSIS — Z992 Dependence on renal dialysis: Secondary | ICD-10-CM | POA: Diagnosis not present

## 2018-04-30 DIAGNOSIS — Z992 Dependence on renal dialysis: Secondary | ICD-10-CM | POA: Diagnosis not present

## 2018-04-30 DIAGNOSIS — N186 End stage renal disease: Secondary | ICD-10-CM | POA: Diagnosis not present

## 2018-04-30 DIAGNOSIS — D509 Iron deficiency anemia, unspecified: Secondary | ICD-10-CM | POA: Diagnosis not present

## 2018-04-30 DIAGNOSIS — D631 Anemia in chronic kidney disease: Secondary | ICD-10-CM | POA: Diagnosis not present

## 2018-04-30 DIAGNOSIS — N2581 Secondary hyperparathyroidism of renal origin: Secondary | ICD-10-CM | POA: Diagnosis not present

## 2018-05-03 DIAGNOSIS — N2581 Secondary hyperparathyroidism of renal origin: Secondary | ICD-10-CM | POA: Diagnosis not present

## 2018-05-03 DIAGNOSIS — Z992 Dependence on renal dialysis: Secondary | ICD-10-CM | POA: Diagnosis not present

## 2018-05-03 DIAGNOSIS — D631 Anemia in chronic kidney disease: Secondary | ICD-10-CM | POA: Diagnosis not present

## 2018-05-03 DIAGNOSIS — N186 End stage renal disease: Secondary | ICD-10-CM | POA: Diagnosis not present

## 2018-05-03 DIAGNOSIS — D509 Iron deficiency anemia, unspecified: Secondary | ICD-10-CM | POA: Diagnosis not present

## 2018-05-03 DIAGNOSIS — E119 Type 2 diabetes mellitus without complications: Secondary | ICD-10-CM | POA: Diagnosis not present

## 2018-05-05 DIAGNOSIS — Z992 Dependence on renal dialysis: Secondary | ICD-10-CM | POA: Diagnosis not present

## 2018-05-05 DIAGNOSIS — D509 Iron deficiency anemia, unspecified: Secondary | ICD-10-CM | POA: Diagnosis not present

## 2018-05-05 DIAGNOSIS — N2581 Secondary hyperparathyroidism of renal origin: Secondary | ICD-10-CM | POA: Diagnosis not present

## 2018-05-05 DIAGNOSIS — N186 End stage renal disease: Secondary | ICD-10-CM | POA: Diagnosis not present

## 2018-05-05 DIAGNOSIS — D631 Anemia in chronic kidney disease: Secondary | ICD-10-CM | POA: Diagnosis not present

## 2018-05-07 DIAGNOSIS — N2581 Secondary hyperparathyroidism of renal origin: Secondary | ICD-10-CM | POA: Diagnosis not present

## 2018-05-07 DIAGNOSIS — D631 Anemia in chronic kidney disease: Secondary | ICD-10-CM | POA: Diagnosis not present

## 2018-05-07 DIAGNOSIS — N186 End stage renal disease: Secondary | ICD-10-CM | POA: Diagnosis not present

## 2018-05-07 DIAGNOSIS — D509 Iron deficiency anemia, unspecified: Secondary | ICD-10-CM | POA: Diagnosis not present

## 2018-05-07 DIAGNOSIS — Z992 Dependence on renal dialysis: Secondary | ICD-10-CM | POA: Diagnosis not present

## 2018-05-10 DIAGNOSIS — B351 Tinea unguium: Secondary | ICD-10-CM | POA: Diagnosis not present

## 2018-05-10 DIAGNOSIS — E1151 Type 2 diabetes mellitus with diabetic peripheral angiopathy without gangrene: Secondary | ICD-10-CM | POA: Diagnosis not present

## 2018-05-12 DIAGNOSIS — D509 Iron deficiency anemia, unspecified: Secondary | ICD-10-CM | POA: Diagnosis not present

## 2018-05-12 DIAGNOSIS — D631 Anemia in chronic kidney disease: Secondary | ICD-10-CM | POA: Diagnosis not present

## 2018-05-12 DIAGNOSIS — N186 End stage renal disease: Secondary | ICD-10-CM | POA: Diagnosis not present

## 2018-05-12 DIAGNOSIS — Z992 Dependence on renal dialysis: Secondary | ICD-10-CM | POA: Diagnosis not present

## 2018-05-12 DIAGNOSIS — N2581 Secondary hyperparathyroidism of renal origin: Secondary | ICD-10-CM | POA: Diagnosis not present

## 2018-05-14 DIAGNOSIS — N2581 Secondary hyperparathyroidism of renal origin: Secondary | ICD-10-CM | POA: Diagnosis not present

## 2018-05-14 DIAGNOSIS — N186 End stage renal disease: Secondary | ICD-10-CM | POA: Diagnosis not present

## 2018-05-14 DIAGNOSIS — D631 Anemia in chronic kidney disease: Secondary | ICD-10-CM | POA: Diagnosis not present

## 2018-05-14 DIAGNOSIS — Z992 Dependence on renal dialysis: Secondary | ICD-10-CM | POA: Diagnosis not present

## 2018-05-14 DIAGNOSIS — D509 Iron deficiency anemia, unspecified: Secondary | ICD-10-CM | POA: Diagnosis not present

## 2018-05-17 DIAGNOSIS — N186 End stage renal disease: Secondary | ICD-10-CM | POA: Diagnosis not present

## 2018-05-17 DIAGNOSIS — N2581 Secondary hyperparathyroidism of renal origin: Secondary | ICD-10-CM | POA: Diagnosis not present

## 2018-05-17 DIAGNOSIS — Z992 Dependence on renal dialysis: Secondary | ICD-10-CM | POA: Diagnosis not present

## 2018-05-17 DIAGNOSIS — D509 Iron deficiency anemia, unspecified: Secondary | ICD-10-CM | POA: Diagnosis not present

## 2018-05-17 DIAGNOSIS — D631 Anemia in chronic kidney disease: Secondary | ICD-10-CM | POA: Diagnosis not present

## 2018-05-18 ENCOUNTER — Encounter: Payer: Self-pay | Admitting: Internal Medicine

## 2018-05-18 ENCOUNTER — Non-Acute Institutional Stay (SKILLED_NURSING_FACILITY): Payer: Medicare Other | Admitting: Internal Medicine

## 2018-05-18 DIAGNOSIS — E039 Hypothyroidism, unspecified: Secondary | ICD-10-CM

## 2018-05-18 DIAGNOSIS — E1121 Type 2 diabetes mellitus with diabetic nephropathy: Secondary | ICD-10-CM

## 2018-05-18 DIAGNOSIS — S72491A Other fracture of lower end of right femur, initial encounter for closed fracture: Secondary | ICD-10-CM | POA: Diagnosis not present

## 2018-05-18 DIAGNOSIS — I82401 Acute embolism and thrombosis of unspecified deep veins of right lower extremity: Secondary | ICD-10-CM | POA: Diagnosis not present

## 2018-05-18 DIAGNOSIS — N186 End stage renal disease: Secondary | ICD-10-CM | POA: Diagnosis not present

## 2018-05-18 DIAGNOSIS — K529 Noninfective gastroenteritis and colitis, unspecified: Secondary | ICD-10-CM | POA: Diagnosis not present

## 2018-05-18 NOTE — Progress Notes (Signed)
Location:   [facility] Room Number: 112/D Place of Service:  SNF (31) Provider:     Fanta, Tesfaye, MD  Patient Care Team: Fanta, Tesfaye, MD as PCP - General (Internal Medicine) Nida, Gebreselassie W, MD as Consulting Physician (Endocrinology) Befekadu, Belayenh, MD as Consulting Physician (Nephrology) Rourk, Robert M, MD as Consulting Physician (Gastroenterology)  Extended Emergency Contact Information Primary Emergency Contact: Price,Melissa Address: [address]  [place] Home Phone: [phone] Work Phone: [phone] Relation: Legal Guardian Secondary Emergency Contact: Brahm,Richard Address: [address]          [place], [place] [address] [place] Home Phone: [phone] Relation: Brother  Code Status: Full Code  Goals of care: Advanced Directive information Advanced Directives 05/18/2018  Does Patient Have a Medical Advance Directive? Yes  Type of Advance Directive (No Data)  Does patient want to make changes to medical advance directive? No - Patient declined  Copy of Healthcare Power of Attorney in Chart? No - copy requested  Would patient like information on creating a medical advance directive? No - Patient declined  Pre-existing out of facility DNR order (yellow form or pink MOST form) -     Chief complaint- routine visit for medical management of chronic medical conditions including end-stage renal disease on dialysis-type 2 diabetes- hypothyroidism- anemia of chronic disease-mild mental delay- history of right distal femur fracture status post ORIF- in addition to right lower extremity DVT- GI bleed status post colectomy-   HPI:  Pt is a 72 y.o. male seen today for medical management of chronic diseases.  As noted above.  Patient is a long-term resident of facility and continues to do well with supportive care.  He does have a history of end-stage renal disease and continues on dialysis which he  appears to be tolerating well- he does have an order for Midodrine secondary to concerns for hypotension on dialysis days-.  Blood pressure today was 110/68 this appears to be relatively stable at times will have systolics in the 90s but is asymptomatic.  His weight is stabilized at 206 pounds.  Regards to type 2 diabetes he is on Lantus and Tradjenta hemoglobin A1c was rising but now has stabilized most recently was 8.0 on June lab it had been 8.3 previously.  Morning blood sugars recently appear to be more in the higher 100s somewhat more variability later in the day it appears averaging more in the lower 200s-he is quite noncompliant at times later in the day with his diet which I suspect contributes which  makes control somewhat challenging  He does have a history of mild mental deficits but does well with supportive care nursing does not report any behavioral issues.  When he first came here he did have a right distal femur fracture repaired- this was complicated with a DVT he had been on anticoagulation but developed a GI bleed- required a colectomy at one point- he is no longer on anticoagulation hemoglobin has shown stability most recently running between 9.8 and 11.  He does also have a history of anemia chronic disease which contributes to this with his chronic kidney disease- he is on  Iron   he did have an incidental finding on abdominal CT back in March that showed a pancreatic head lesion-recommendation for follow-up in 2 years he is asymptomatic CT was done because He was treated for gastroenteritis in March when he experienced abdominal pain vomiting and tachycardia with a low-grade fever- CT of the abdomen did   show enteritis of the small bowel-antibiotics were stopped because he became afebrile and white count was not elevated- there has been no reoccurrence   Currently he is sitting in his wheelchair comfortably he has no complaints     Past Medical History:  Diagnosis  Date  . Abnormal CT scan, kidney 10/06/2011  . Acute pyelonephritis 10/07/2011  . Anemia    normocytic  . Anxiety    mental retardation  . Bladder wall thickening 10/06/2011  . BPH (benign prostatic hypertrophy)   . Diabetes mellitus   . Dialysis patient [facility])    Tuesday, Thursday and Saturday,   . Edema     history of lower extremity edema  . GERD (gastroesophageal reflux disease)   . Heme positive stool   . Hydronephrosis   . Hyperkalemia   . Hyperlipidemia   . Hypernatremia   . Hypertension   . Hypothyroidism   . Impaired speech   . 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 MR (mental retardation)   . Muscle weakness   . Obstructive uropathy   . 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 Poor historian poor historian  . Protein calorie malnutrition ([place])   . Pyelonephritis   . Renal failure (ARF), acute on chronic (HCC)   . Renal insufficiency    chronic history  . Sepsis ([place])   . Smoking   . Uremia   . Urinary retention   . UTI (lower urinary tract infection) 10/06/2011   Past Surgical History:  Procedure Laterality Date  . AV FISTULA PLACEMENT Left 07/06/2015   Procedure:  INSERTION LEFT ARM ARTERIOVENOUS GORTEX GRAFT;  Surgeon: Angelia Mould, MD;  Location: [place];  Service: Vascular;  Laterality: Left;  . AV FISTULA PLACEMENT Right 02/26/2016   Procedure: ARTERIOVENOUS (AV) FISTULA CREATION ;  Surgeon: Angelia Mould, MD;  Location: [place];  Service: Vascular;  Laterality: Right;  . [place] REMOVAL Left 10/09/2015   Procedure: REMOVAL OF ARTERIOVENOUS GORETEX GRAFT ([place]) Evacuation of Lymphocele, Vein Patch angioplasty of brachial artery.;  Surgeon: Angelia Mould, MD;  Location: [place];  Service: Vascular;  Laterality: Left;  . BASCILIC VEIN TRANSPOSITION Right 02/26/2016   Procedure: Right BASCILIC VEIN TRANSPOSITION;  Surgeon: Angelia Mould, MD;  Location: [place];  Service: Vascular;  Laterality: Right;  . CIRCUMCISION N/A 01/04/2014    Procedure: CIRCUMCISION ADULT (procedure #1);  Surgeon: Marissa Nestle, MD;  Location: [facility];  Service: Urology;  Laterality: N/A;  . COLECTOMY N/A 05/04/2017   Procedure: TOTAL COLECTOMY;  Surgeon: Aviva Signs, MD;  Location: [facility];  Service: General;  Laterality: N/A;  . COLONOSCOPY N/A 04/27/2017   Procedure: COLONOSCOPY;  Surgeon: Daneil Dolin, MD;  [facility];  Service: Endoscopy;  Laterality: N/A;  245  . CYSTOSCOPY W/ RETROGRADES Bilateral 06/29/2015   Procedure: CYSTOSCOPY, DILATION OF URETHRAL STRICTURE WITH BILATERAL RETROGRADE PYELOGRAM,SUPRAPUBIC TUBE CHANGE;  Surgeon: Festus Aloe, MD;  Location: [facility];  Service: Urology;  Laterality: Bilateral;  . CYSTOSCOPY WITH URETHRAL DILATATION N/A 12/29/2013   Procedure: CYSTOSCOPY WITH URETHRAL DILATATION;  Surgeon: Marissa Nestle, MD;  Location[facility];  Service: Urology;  Laterality: N/A;  . ESOPHAGOGASTRODUODENOSCOPY N/A 04/27/2017   Procedure: ESOPHAGOGASTRODUODENOSCOPY (EGD);  Surgeon: Daneil Dolin, MD;  [facility];  Service: Endoscopy;  Laterality: N/A;  . IR THROMBECTOMY AV FISTULA W/THROMBOLYSIS/PTA INC/SHUNT/IMG RIGHT Right 04/26/2018  . IR US GUIDE VASC ACCESS RIGHT  04/26/2018  . ORIF FEMUR FRACTURE Right 11/22/2016  Procedure: OPEN REDUCTION INTERNAL FIXATION (ORIF) DISTAL FEMUR FRACTURE;  Surgeon: Rod Can, MD;  Location: [place];  Service: Orthopedics;  Laterality: Right;  . PATCH ANGIOPLASTY Right 12/10/2017   Procedure: PATCH ANGIOPLASTY;  Surgeon: Angelia Mould, MD;  Location[facility] OR;  Service: Vascular;  Laterality: Right;  . PERIPHERAL VASCULAR CATHETERIZATION N/A 10/08/2015   Procedure: A/V Shuntogram;  Surgeon: Angelia Mould, MD;  Location: [place] CV LAB;  Service: Cardiovascular;  Laterality: N/A;  . THROMBECTOMY W/ EMBOLECTOMY Right 12/10/2017   Procedure: THROMBECTOMY REVISION RIGHT ARM  ARTERIOVENOUS FISTULA;  Surgeon: Angelia Mould, MD;  Location: [place];  Service: Vascular;  Laterality: Right;  . TRANSURETHRAL RESECTION OF PROSTATE N/A 01/04/2014   Procedure: TRANSURETHRAL RESECTION OF THE PROSTATE (TURP) (procedure #2);  Surgeon: Marissa Nestle, MD;  [facility]: [facility];  Service: Urology;  Laterality: N/A;    No Known Allergies  Outpatient Encounter Medications as of 05/18/2018  Medication Sig  . atorvastatin (LIPITOR) 40 MG tablet Take 40 mg by mouth every evening.  . Insulin Glargine (BASAGLAR KWIKPEN) 100 UNIT/ML SOPN Inject 10 Units into the skin at bedtime.  Marland Kitchen levothyroxine (SYNTHROID, LEVOTHROID) 88 MCG tablet Take 88 mcg by mouth daily before breakfast.  . linagliptin (TRADJENTA) 5 MG TABS tablet Take 5 mg by mouth every evening.   . midodrine (PROAMATINE) 10 MG tablet Take 0.5 tablets (5 mg total) by mouth every dialysis (hypotension).  . Multiple Vitamin (MULTIVITAMIN WITH MINERALS) TABS tablet Take 1 tablet by mouth every evening.   . Nutritional Supplements (NEPRO/CARBSTEADY PO) Take 1 Can by mouth 2 (two) times daily.  Marland Kitchen omeprazole (PRILOSEC) 40 MG capsule Take 40 mg by mouth at bedtime.   . polyethylene glycol (MIRALAX / GLYCOLAX) packet Take 17 g by mouth as needed for mild constipation.   . sevelamer carbonate (RENVELA) 800 MG tablet Take 1,600-3,200 mg by mouth See admin instructions. Take 4 tablets (322m) by mouth 3 times daily with meals, and 2 tablets (16041m by mouth with bedtime snack  . tamsulosin (FLOMAX) 0.4 MG CAPS capsule Take 0.4 mg by mouth every evening. Give 30 minutes after a meal  . torsemide (DEMADEX) 10 MG tablet Take 5 tablets (50 mg total) by mouth daily.   No facility-administered encounter medications on file as of 05/18/2018.      Review of Systems   General is not complaining of any fever chills his weight appears relatively stabilized  Skin does not complain of rashes or itching fistula site is on his right upper arm with positive bruit.  Head ears eyes nose mouth and throat is not  complain of visual changes sore throat or difficulty swallowing.  RESP-- is not complaining of shortness of breath or cough  Cardiac does not complain of chest pain has mild lower extremity edema.  GI is not complaining of any abdominal pain vomiting diarrhea constipation continues to have a good appetite  GU is not complaining of dysuria he does have end-stage renal disease on dialysis  Musculoskeletal is not complaining of joint pain.  Neurologic does not complain of dizziness headache or numbness or syncope.  Psych does not complain of being depressed or anxious continues to be in good spirits laughing smiling  Immunization History  Administered Date(s) Administered  . Influenza,inj,Quad PF,6+ Mos 12/26/2013, 07/09/2015  . Influenza-Unspecified 07/31/2017  . PPD Test 03/07/2015  . Pneumococcal Conjugate-13 07/31/2017  . Pneumococcal Polysaccharide-23 03/04/2015  . Tdap 08/22/2017   Pertinent  Health Maintenance Due  Topic Date Due  . OPHTHALMOLOGY EXAM  05/20/2018 (Originally 08/23/1956)  . URINE MICROALBUMIN  05/20/2018 (Originally 08/23/1956)  . INFLUENZA VACCINE  05/27/2018  . FOOT EXAM  07/22/2018  . HEMOGLOBIN A1C  10/22/2018  . COLONOSCOPY  04/28/2027  . PNA vac Low Risk Adult  Completed   Fall Risk  02/18/2018  Falls in the past year? No   Functional Status Survey:    Vitals:   05/18/18 1208  BP: 120/88  Pulse: 92  Resp: 18  Temp: 97.7 F (36.5 C)  TempSrc: Oral  SpO2: 97%  Weight: 188 lb (85.3 kg)  Height: 5' 4" (1.626 m)   Body mass index is 32.27 kg/m. Physical Exam   In general this is a very pleasant male in no distress sitting comfortably in his wheelchair   His skin is warm and dry he does have a fistula with positive bruit right upper arm  Eyes sclera and conjunctive are clear visual acuity appears to be grossly intact  Oropharynx is clear tongue is midline with full range of motion-mucous membranes are moist  Chest is clear to  auscultation there is no labored breathing   Heart is regular rate and rhythm without murmur gallop or rub mild lower extremity edema a bit more on the right versus the left which is not new Heart sounds are somewhat distant   Abdomen is soft somewhat obese nontender with positive bowel sounds   Musculoskeletal-moves All extremities at baseline largely ambulates in wheelchair-has some continued lower extremity weakness upper extremity strength appears preserved  Neurologic is grossly intact his speech is clear no lateralizing findings  Psych he is oriented to self is pleasant appropriate does answer straightforward questions appropriately    Labs reviewed: Recent Labs    06/08/17 0519 06/10/17 0727  01/22/18 0945 04/22/18 0718 04/26/18 1236  NA 134* 137   < > 137 137 137  K 3.5 3.2*   < > 3.4* 4.3 4.8  CL 97* 100*   < > 94* 101 102  CO2 27 26   < > 21* 25 21*  GLUCOSE 96 146*   < > 143* 271* 165*  BUN 50* 34*   < > 61* 51* 62*  CREATININE 5.68* 4.96*   < > 10.63* 9.14* 11.66*  CALCIUM 8.0* 8.4*   < > 8.2* 8.9 8.6*  PHOS 5.1* 3.7  --  7.9*  --   --    < > = values in this interval not displayed.   Recent Labs    06/03/17 0055  06/10/17 0727 01/19/18 1608 01/22/18 0945  AST 16  --   --  22  --   ALT 15*  --   --  18  --   ALKPHOS 87  --   --  152*  --   BILITOT 0.5  --   --  0.8  --   PROT 6.5  --   --  6.8  --   ALBUMIN 2.4*   < > 2.1* 3.3* 3.2*   < > = values in this interval not displayed.   Recent Labs    01/19/18 0700 01/19/18 1608  01/22/18 0945 04/22/18 0718 04/26/18 1236  WBC 8.9 7.4   < > 6.2 6.6 8.7  NEUTROABS 6.8 5.3  --   --  3.5  --   HGB 11.6* 11.0*   < > 10.9* 11.7* 11.0*  HCT 36.3* 34.8*   < > 34.3* 36.0* 33.7*  MCV 106.1* 106.4*   < >   106.9* 105.6* 105.0*  PLT 145* 143*   < > 153 142* 125*   < > = values in this interval not displayed.   Lab Results  Component Value Date   TSH 5.265 (H) 04/22/2018   Lab Results  Component Value  Date   HGBA1C 8.0 (H) 04/22/2018   Lab Results  Component Value Date   CHOL 89 04/22/2018   HDL 23 (L) 04/22/2018   LDLCALC NCAL 04/22/2018   TRIG 396 (H) 04/22/2018   CHOLHDL 3.9 04/22/2018    Significant Diagnostic Results in last 30 days:  Ir US Guide Vasc Access Right  Result Date: 04/26/2018 INDICATION: Clotted right upper arm AV fistula. EXAM: 1. FISTULALYSIS 2. ANGIOPLASTY OF VENOUS LIMB AND VENOUS ANASTOMOSIS 3. ULTRASOUND GUIDANCE FOR VENOUS ACCESS COMPARISON:  None. - this is the first intervention on the patient's right upper arm AV fistula MEDICATIONS: Heparin 4000 units IV; TPA 2 mg into thrombosed segment of the fistula. CONTRAST:  90 cc Isovue-300 ANESTHESIA/SEDATION: Moderate (conscious) sedation was employed during this procedure. A total of Versed 4 mg and Fentanyl 200 mcg was administered intravenously. Moderate Sedation Time: 91 minutes. [name] level of consciousness and vital signs were monitored continuously by radiology nursing throughout the procedure under my direct supervision. FLUOROSCOPY TIME:  11 minutes 12 seconds (36 mGy) COMPLICATIONS: None immediate. TECHNIQUE: Informed written consent was obtained from the patient after a discussion of the risks, benefits and alternatives to treatment. Questions regarding the procedure were encouraged and answered. A timeout was performed prior to the initiation of the procedure. On physical examination, a palpable post with noted within the peripheral aspect the right upper arm AV fistula. Sonographic evaluation was then performed demonstrating patency of the arterial anastomosis and peripheral arterial limb with eventual occlusion of the AV fistula at the level of the mid humerus. As such, the skin overlying the right upper arm AV fistula was draped in the usual sterile fashion, and a sterile drape was applied covering the operative field. Maximum barrier sterile technique with sterile gowns and gloves were used for the  procedure. Under ultrasound guidance, the fistula was accessed directed towards the draining venous limb with a micropuncture kit after the overlying soft tissues were anesthetized with 1% lidocaine. An ultrasound image was saved for documentation purposes. The micropuncture sheath was exchange for a 7-French vascular sheath over a guidewire. Over a regular glidewire, a Kumpe catheter was advanced centrally and a central venogram was performed. Pull back venogram was performed with the Kumpe catheter. Heparin was administered systemically and TPA was administered via the Kumpe catheter into the thrombosed segment of the fistula. Next, mechanical thrombectomy was performed thrombosed segment of the fistula with the use of a Angiojet device. Next, the thrombosed segment of the venous limb was angioplastied at multiple stations with a 6 mm x 4 cm Conquest balloon. Fistulagram images was obtained. Next, an additional access was obtained directed towards the anastomoses with a micropuncture kit after the overlying soft tissues anesthetized with 1% lidocaine. This allowed for placement of a 6-French vascular sheath. Tandem areas of moderate (approximately 30%) luminal narrowing within the arterial limb was angioplastied with a 4 mm x 4 cm mustang balloon. Next, several rounds of push-pull mechanical thrombectomy was performed with the use of an occlusion balloon followed by several additional rounds of mechanical thrombectomy with the use of an Angiojet device. Completion fistula images were obtained and the procedure was terminated. All wires, catheters and sheaths were removed from the patient.  Hemostasis was achieved at both access sites with deployment of a swizzle sutures which will be removed at the patient's next dialysis session. Dressings were placed. The patient tolerated the procedure without immediate postprocedural complication. FINDINGS: Preprocedural sonographic evaluation of the patient's right upper arm  brachial-basilic AV fistula demonstrates patency of the arterial anastomosis and arterial limb with eventual occlusion of the AV fistula at the level of the mid humerus. Flow was ultimately restored to the right upper arm AV fistula utilizing a combination pharmacological and mechanical thrombectomy, including with the use of an Angiojet device, as detailed above. Balloon angioplasty was performed at multiple overlapping stations throughout the venous limb to 6 mm diameter. Reflux fistulagram demonstrated 2 focal moderate (approximately 50%) luminal narrowings of the arterial limb which was successfully treated with balloon angioplastied to 4 mm diameter. Completion fistulagram images demonstrate improved flow through the dialysis fistula with a moderate amount of residual nonocclusive mural thrombus within the dominant stick zone of the fistula. On physical examination, easily palpable pulses are noted throughout the entirety of the fistula. The central venous system of the right upper extremity is widely patent to the level the superior aspect the right atrium. IMPRESSION: 1. Technically successful attempted right upper arm brachial-basilic AV fistula thrombolysis. 2. Successful angioplasty of the venous limb to 6 mm diameter. 3. Successful angioplasty of the arterial limb to 4 mm diameter. 4. Completion shuntogram demonstrate restored patency to the right upper arm brachial-basilic AV fistula with moderate amount of non occlusive potentially chronic mural thrombus within the stick zone of the AV fistula, though the fistula now has an easily palpable pulse 5. The central venous system is widely patent. ACCESS: - While a moderate amount of nonocclusive potentially chronic mural thrombus remains within the stick zone of the fistula, the fistula demonstrates an easily palpable pulse and as such, should be utilized for attempted dialysis access. - If the fistula were to experience early rethrombosis (within the next  1-2 months), would recommend surgical revision. Above findings discussed with Lyles, Nephrology PA, at the time of procedure completion. Electronically Signed   By: John  Watts M.D.   On: 04/26/2018 17:08   Ir Thrombectomy Av Fistula/w Thrombolysis/pta Inc Shunt/img Right  Result Date: 04/26/2018 INDICATION: Clotted right upper arm AV fistula. EXAM: 1. FISTULALYSIS 2. ANGIOPLASTY OF VENOUS LIMB AND VENOUS ANASTOMOSIS 3. ULTRASOUND GUIDANCE FOR VENOUS ACCESS COMPARISON:  None. - this is the first intervention on the patient's right upper arm AV fistula MEDICATIONS: Heparin 4000 units IV; TPA 2 mg into thrombosed segment of the fistula. CONTRAST:  90 cc Isovue-300 ANESTHESIA/SEDATION: Moderate (conscious) sedation was employed during this procedure. A total of Versed 4 mg and Fentanyl 200 mcg was administered intravenously. Moderate Sedation Time: 91 minutes. [name] level of consciousness and vital signs were monitored continuously by radiology nursing throughout the procedure under my direct supervision. FLUOROSCOPY TIME:  11 minutes 12 seconds (36 mGy) COMPLICATIONS: None immediate. TECHNIQUE: Informed written consent was obtained from the patient after a discussion of the risks, benefits and alternatives to treatment. Questions regarding the procedure were encouraged and answered. A timeout was performed prior to the initiation of the procedure. On physical examination, a palpable post with noted within the peripheral aspect the right upper arm AV fistula. Sonographic evaluation was then performed demonstrating patency of the arterial anastomosis and peripheral arterial limb with eventual occlusion of the AV fistula at the level of the mid humerus. As such, the skin overlying the right upper arm AV   fistula was draped in the usual sterile fashion, and a sterile drape was applied covering the operative field. Maximum barrier sterile technique with sterile gowns and gloves were used for the procedure. Under  ultrasound guidance, the fistula was accessed directed towards the draining venous limb with a micropuncture kit after the overlying soft tissues were anesthetized with 1% lidocaine. An ultrasound image was saved for documentation purposes. The micropuncture sheath was exchange for a 7-French vascular sheath over a guidewire. Over a regular glidewire, a Kumpe catheter was advanced centrally and a central venogram was performed. Pull back venogram was performed with the Kumpe catheter. Heparin was administered systemically and TPA was administered via the Kumpe catheter into the thrombosed segment of the fistula. Next, mechanical thrombectomy was performed thrombosed segment of the fistula with the use of a Angiojet device. Next, the thrombosed segment of the venous limb was angioplastied at multiple stations with a 6 mm x 4 cm Conquest balloon. Fistulagram images was obtained. Next, an additional access was obtained directed towards the anastomoses with a micropuncture kit after the overlying soft tissues anesthetized with 1% lidocaine. This allowed for placement of a 6-French vascular sheath. Tandem areas of moderate (approximately 30%) luminal narrowing within the arterial limb was angioplastied with a 4 mm x 4 cm mustang balloon. Next, several rounds of push-pull mechanical thrombectomy was performed with the use of an occlusion balloon followed by several additional rounds of mechanical thrombectomy with the use of an Angiojet device. Completion fistula images were obtained and the procedure was terminated. All wires, catheters and sheaths were removed from the patient. Hemostasis was achieved at both access sites with deployment of a swizzle sutures which will be removed at the patient's next dialysis session. Dressings were placed. The patient tolerated the procedure without immediate postprocedural complication. FINDINGS: Preprocedural sonographic evaluation of the patient's right upper arm brachial-basilic  AV fistula demonstrates patency of the arterial anastomosis and arterial limb with eventual occlusion of the AV fistula at the level of the mid humerus. Flow was ultimately restored to the right upper arm AV fistula utilizing a combination pharmacological and mechanical thrombectomy, including with the use of an Angiojet device, as detailed above. Balloon angioplasty was performed at multiple overlapping stations throughout the venous limb to 6 mm diameter. Reflux fistulagram demonstrated 2 focal moderate (approximately 50%) luminal narrowings of the arterial limb which was successfully treated with balloon angioplastied to 4 mm diameter. Completion fistulagram images demonstrate improved flow through the dialysis fistula with a moderate amount of residual nonocclusive mural thrombus within the dominant stick zone of the fistula. On physical examination, easily palpable pulses are noted throughout the entirety of the fistula. The central venous system of the right upper extremity is widely patent to the level the superior aspect the right atrium. IMPRESSION: 1. Technically successful attempted right upper arm brachial-basilic AV fistula thrombolysis. 2. Successful angioplasty of the venous limb to 6 mm diameter. 3. Successful angioplasty of the arterial limb to 4 mm diameter. 4. Completion shuntogram demonstrate restored patency to the right upper arm brachial-basilic AV fistula with moderate amount of non occlusive potentially chronic mural thrombus within the stick zone of the AV fistula, though the fistula now has an easily palpable pulse 5. The central venous system is widely patent. ACCESS: - While a moderate amount of nonocclusive potentially chronic mural thrombus remains within the stick zone of the fistula, the fistula demonstrates an easily palpable pulse and as such, should be utilized for attempted dialysis access. - If   the fistula were to experience early rethrombosis (within the next 1-2 months), would  recommend surgical revision. Above findings discussed with Lyles, Nephrology PA, at the time of procedure completion. Electronically Signed   By: John  Watts M.D.   On: 04/26/2018 17:08    Assessment/Plan  #1- history of end-stage renal disease on dialysis-again he is tolerating dialysis well he does receive Demadex to help with edema--again he is monitored closely by dialysis and has tolerated this well now for some time   #2 history of diabetes type 2 hemoglobin A1c at 8.0 shows some improvement- he is on Lantus and Tradjenta- later in the day blood sugars continue at times to be somewhat elevated but this has been difficult to manage because of noncompliance later in the day- we and nursing staff have spoken several times to patient about this and will continue to encourage compliance  3-hypothyroidism-TSH recently was minimally elevated it appears Synthroid dose was adjusted will update a TSH level in a couple weeks  #4- anemia 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  6.  History of BPH he continues on Flomax.  This appears to be stable.  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    

## 2018-05-19 ENCOUNTER — Encounter (HOSPITAL_COMMUNITY)
Admission: RE | Admit: 2018-05-19 | Discharge: 2018-05-19 | Disposition: A | Discharge status: Home / Self Care | Payer: Medicare Other | Source: Skilled Nursing Facility | Attending: Internal Medicine | Admitting: Internal Medicine

## 2018-05-19 DIAGNOSIS — E1129 Type 2 diabetes mellitus with other diabetic kidney complication: Secondary | ICD-10-CM | POA: Insufficient documentation

## 2018-05-19 DIAGNOSIS — Z9049 Acquired absence of other specified parts of digestive tract: Secondary | ICD-10-CM | POA: Insufficient documentation

## 2018-05-19 DIAGNOSIS — D500 Iron deficiency anemia secondary to blood loss (chronic): Secondary | ICD-10-CM | POA: Insufficient documentation

## 2018-05-19 DIAGNOSIS — Z992 Dependence on renal dialysis: Secondary | ICD-10-CM | POA: Diagnosis not present

## 2018-05-19 DIAGNOSIS — N186 End stage renal disease: Secondary | ICD-10-CM | POA: Insufficient documentation

## 2018-05-19 DIAGNOSIS — D509 Iron deficiency anemia, unspecified: Secondary | ICD-10-CM | POA: Diagnosis not present

## 2018-05-19 DIAGNOSIS — D631 Anemia in chronic kidney disease: Secondary | ICD-10-CM | POA: Diagnosis not present

## 2018-05-19 DIAGNOSIS — N2581 Secondary hyperparathyroidism of renal origin: Secondary | ICD-10-CM | POA: Diagnosis not present

## 2018-05-21 DIAGNOSIS — N186 End stage renal disease: Secondary | ICD-10-CM | POA: Diagnosis not present

## 2018-05-21 DIAGNOSIS — D631 Anemia in chronic kidney disease: Secondary | ICD-10-CM | POA: Diagnosis not present

## 2018-05-21 DIAGNOSIS — D509 Iron deficiency anemia, unspecified: Secondary | ICD-10-CM | POA: Diagnosis not present

## 2018-05-21 DIAGNOSIS — N2581 Secondary hyperparathyroidism of renal origin: Secondary | ICD-10-CM | POA: Diagnosis not present

## 2018-05-21 DIAGNOSIS — Z992 Dependence on renal dialysis: Secondary | ICD-10-CM | POA: Diagnosis not present

## 2018-05-24 DIAGNOSIS — N186 End stage renal disease: Secondary | ICD-10-CM | POA: Diagnosis not present

## 2018-05-24 DIAGNOSIS — Z992 Dependence on renal dialysis: Secondary | ICD-10-CM | POA: Diagnosis not present

## 2018-05-24 DIAGNOSIS — N2581 Secondary hyperparathyroidism of renal origin: Secondary | ICD-10-CM | POA: Diagnosis not present

## 2018-05-24 DIAGNOSIS — D631 Anemia in chronic kidney disease: Secondary | ICD-10-CM | POA: Diagnosis not present

## 2018-05-24 DIAGNOSIS — D509 Iron deficiency anemia, unspecified: Secondary | ICD-10-CM | POA: Diagnosis not present

## 2018-05-25 ENCOUNTER — Encounter: Payer: Self-pay | Admitting: Internal Medicine

## 2018-05-25 ENCOUNTER — Encounter (HOSPITAL_COMMUNITY)
Admission: RE | Admit: 2018-05-25 | Discharge: 2018-05-25 | Disposition: A | Discharge status: Home / Self Care | Payer: Medicare Other | Source: Skilled Nursing Facility | Attending: Internal Medicine | Admitting: Internal Medicine

## 2018-05-25 ENCOUNTER — Non-Acute Institutional Stay (SKILLED_NURSING_FACILITY): Payer: Medicare Other | Admitting: Internal Medicine

## 2018-05-25 DIAGNOSIS — Z9049 Acquired absence of other specified parts of digestive tract: Secondary | ICD-10-CM | POA: Insufficient documentation

## 2018-05-25 DIAGNOSIS — I959 Hypotension, unspecified: Secondary | ICD-10-CM

## 2018-05-25 DIAGNOSIS — I517 Cardiomegaly: Secondary | ICD-10-CM | POA: Diagnosis not present

## 2018-05-25 DIAGNOSIS — Q839 Congenital malformation of breast, unspecified: Secondary | ICD-10-CM

## 2018-05-25 DIAGNOSIS — N186 End stage renal disease: Secondary | ICD-10-CM | POA: Insufficient documentation

## 2018-05-25 DIAGNOSIS — D500 Iron deficiency anemia secondary to blood loss (chronic): Secondary | ICD-10-CM | POA: Insufficient documentation

## 2018-05-25 DIAGNOSIS — E1129 Type 2 diabetes mellitus with other diabetic kidney complication: Secondary | ICD-10-CM | POA: Insufficient documentation

## 2018-05-25 DIAGNOSIS — M1990 Unspecified osteoarthritis, unspecified site: Secondary | ICD-10-CM | POA: Diagnosis not present

## 2018-05-25 LAB — TSH: TSH: 3.556 u[IU]/mL (ref 0.350–4.500)

## 2018-05-25 NOTE — Progress Notes (Signed)
Location:   [place] Room Number: 112/D Place of Service:  SNF (31) Provider:  Glennon Hamilton, MD  Patient Care Team: [name], MD as PCP - General (Internal Medicine) Cassandria Anger, MD as Consulting Physician (Endocrinology) Fran Lowes, MD as Consulting Physician (Nephrology) Gala Romney Cristopher Estimable, MD as Consulting Physician (Gastroenterology)  Extended Emergency Contact Information Primary Emergency Contact: Betsey Amen Address: DSS  [place] of [place] Phone: [phone] Work Phone: [phone] Relation: Legal Guardian Secondary Emergency Contact: Maurin,Richard Address: [address]          [place], [place] [address] [place] Phone: [phone] Relation: Brother  Code Status:  Full Code Goals of care: Advanced Directive information Advanced Directives 05/25/2018  Does Patient Have a Medical Advance Directive? Yes  Type of Advance Directive (No Data)  Does patient want to make changes to medical advance directive? No - Patient declined  Copy of [place] in Chart? No - copy requested  Would patient like information on creating a medical advance directive? No - Patient declined  Pre-existing out of facility DNR order (yellow form or pink MOST form) -    Chief complaint-acute visit follow-up swelling of the left breast--question mastitis   HPI:  Pt is a 72 y.o. male seen today for an acute visit for  Follow-up of swelling of his left breast.  Apparently this was noted yesterday at dialysis and we are following up on this.  Patient is a long-term resident of facility with a history of end-stage renal disease on dialysis- also a type II diabetic in addition hypothyroidism anemia-mild mental delay- as well as a history of a right distal femur fracture that was surgically repaired he also had a right lower extremity DVT- and a GI bleed in the past status post  colectomy  Apparently went to dialysis yesterday and was noted his left breast to be somewhat enlarged and there were concerns for possible developing mastitis.  Today he is at his baseline pleasant jovial wheeling about facility is not complaining of any chest pain or discomfort vital signs are stable he is afebrile    Past Medical History:  Diagnosis Date  . Abnormal CT scan, kidney 10/06/2011  . Acute pyelonephritis 10/07/2011  . Anemia    normocytic  . Anxiety    mental retardation  . Bladder wall thickening 10/06/2011  . BPH (benign prostatic hypertrophy)   . Diabetes mellitus   . Dialysis patient [facility])    Tuesday, Thursday and Saturday,   . Edema     history of lower extremity edema  . GERD (gastroesophageal reflux disease)   . Heme positive stool   . Hydronephrosis   . Hyperkalemia   . Hyperlipidemia   . Hypernatremia   . Hypertension   . Hypothyroidism   . Impaired speech   . 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 MR (mental retardation)   . Muscle weakness   . Obstructive uropathy   . 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 Poor historian poor historian  . Protein calorie malnutrition ([place])   . Pyelonephritis   . Renal failure (ARF), acute on chronic (HCC)   . Renal insufficiency    chronic history  . Sepsis ([place])   . Smoking   . Uremia   . Urinary retention   . UTI (lower urinary tract infection) 10/06/2011   Past Surgical History:  Procedure Laterality Date  . AV FISTULA PLACEMENT Left 07/06/2015  Procedure:  INSERTION LEFT ARM ARTERIOVENOUS GORTEX GRAFT;  Surgeon: Angelia Mould, MD;  Location: [facility];  Service: Vascular;  Laterality: Left;  . AV FISTULA PLACEMENT Right 02/26/2016   Procedure: ARTERIOVENOUS (AV) FISTULA CREATION ;  Surgeon: Angelia Mould, MD;  Location: [facility];  Service: Vascular;  Laterality: Right;  . [place] REMOVAL Left 10/09/2015   Procedure: REMOVAL OF ARTERIOVENOUS GORETEX GRAFT ([place]) Evacuation of Lymphocele, Vein  Patch angioplasty of brachial artery.;  Surgeon: Angelia Mould, MD;  Location: [facility];  Service: Vascular;  Laterality: Left;  . BASCILIC VEIN TRANSPOSITION Right 02/26/2016   Procedure: Right BASCILIC VEIN TRANSPOSITION;  Surgeon: Angelia Mould, MD;  Location: [facility];  Service: Vascular;  Laterality: Right;  . CIRCUMCISION N/A 01/04/2014   Procedure: CIRCUMCISION ADULT (procedure #1);  Surgeon: Marissa Nestle, MD;  Location: [facility];  Service: Urology;  Laterality: N/A;  . COLECTOMY N/A 05/04/2017   Procedure: TOTAL COLECTOMY;  Surgeon: Aviva Signs, MD;  Location: [facility];  Service: General;  Laterality: N/A;  . COLONOSCOPY N/A 04/27/2017   Procedure: COLONOSCOPY;  Surgeon: Daneil Dolin, MD;  [facility];  Service: Endoscopy;  Laterality: N/A;  245  . CYSTOSCOPY W/ RETROGRADES Bilateral 06/29/2015   Procedure: CYSTOSCOPY, DILATION OF URETHRAL STRICTURE WITH BILATERAL RETROGRADE PYELOGRAM,SUPRAPUBIC TUBE CHANGE;  Surgeon: Festus Aloe, MD;  Location: [facility];  Service: Urology;  Laterality: Bilateral;  . CYSTOSCOPY WITH URETHRAL DILATATION N/A 12/29/2013   Procedure: CYSTOSCOPY WITH URETHRAL DILATATION;  Surgeon: Marissa Nestle, MD;  [facility];  Service: Urology;  Laterality: N/A;  . ESOPHAGOGASTRODUODENOSCOPY N/A 04/27/2017   Procedure: ESOPHAGOGASTRODUODENOSCOPY (EGD);  Surgeon: Daneil Dolin, MD;  [facility];  Service: Endoscopy;  Laterality: N/A;  . IR THROMBECTOMY AV FISTULA W/THROMBOLYSIS/PTA INC/SHUNT/IMG RIGHT Right 04/26/2018  . IR US GUIDE VASC ACCESS RIGHT  04/26/2018  . ORIF FEMUR FRACTURE Right 11/22/2016   Procedure: OPEN REDUCTION INTERNAL FIXATION (ORIF) DISTAL FEMUR FRACTURE;  Surgeon: Rod Can, MD;  Location: [facility];  Service: Orthopedics;  Laterality: Right;  . PATCH ANGIOPLASTY Right 12/10/2017   Procedure: PATCH ANGIOPLASTY;  Surgeon: Angelia Mould, MD;  Location: [facility] OR;  Service: Vascular;  Laterality: Right;  .  PERIPHERAL VASCULAR CATHETERIZATION N/A 10/08/2015   Procedure: A/V Shuntogram;  Surgeon: Angelia Mould, MD;  Location: [facility];  Service: Cardiovascular;  Laterality: N/A;  . THROMBECTOMY W/ EMBOLECTOMY Right 12/10/2017   Procedure: THROMBECTOMY REVISION RIGHT ARM  ARTERIOVENOUS FISTULA;  Surgeon: Angelia Mould, MD;  Location: [facility];  Service: Vascular;  Laterality: Right;  . TRANSURETHRAL RESECTION OF PROSTATE N/A 01/04/2014   Procedure: TRANSURETHRAL RESECTION OF THE PROSTATE (TURP) (procedure #2);  Surgeon: Marissa Nestle, MD;  [facility];  Service: Urology;  Laterality: N/A;    No Known Allergies  Outpatient Encounter Medications as of 05/25/2018  Medication Sig  . atorvastatin (LIPITOR) 40 MG tablet Take 80 mg by mouth every evening.   . Insulin Glargine (BASAGLAR KWIKPEN) 100 UNIT/ML SOPN Inject 10 Units into the skin at bedtime.  Marland Kitchen levothyroxine (SYNTHROID, LEVOTHROID) 88 MCG tablet Take 88 mcg by mouth daily before breakfast.  . linagliptin (TRADJENTA) 5 MG TABS tablet Take 5 mg by mouth every evening.   . midodrine (PROAMATINE) 10 MG tablet Take 0.5 tablets (5 mg total) by mouth every dialysis (hypotension).  . Multiple Vitamin (MULTIVITAMIN WITH MINERALS) TABS tablet Take 1 tablet by mouth every evening.   . Nutritional Supplements (  NEPRO/CARBSTEADY PO) Take 1 Can by mouth 2 (two) times daily.  Marland Kitchen omeprazole (PRILOSEC) 40 MG capsule Take 40 mg by mouth at bedtime.   . polyethylene glycol (MIRALAX / GLYCOLAX) packet Take 17 g by mouth as needed for mild constipation.   . sevelamer carbonate (RENVELA) 800 MG tablet Take 1,600-3,200 mg by mouth See admin instructions. Take 4 tablets (3267m) by mouth 3 times daily with meals, and 2 tablets (16095m by mouth with bedtime snack  . tamsulosin (FLOMAX) 0.4 MG CAPS capsule Take 0.4 mg by mouth every evening. Give 30 minutes after a meal  . torsemide (DEMADEX) 10 MG tablet Take 5 tablets (50 mg total) by  mouth daily.   No facility-administered encounter medications on file as of 05/25/2018.     Review of Systems   This is limited somewhat secondary patient being a poor historian provided by nursing as well.  General is not complaining any fever or chills.  Skin not complain of rashes or itching he does have somewhat of a enlarged left breast compared to the right-.  Head ears eyes nose mouth and throat is not complain of visual changes sore throat or any drainage.  Respiratory denies shortness of breath or cough.  Cardiac does not complain of chest pain has some mild baseline lower extremity edema bit more on the right versus the left.  GI is not complaining of any abdominal discomfort nausea vomiting diarrhea constipation continues to have a very good appetite.  GU he is end-stage renal disease does not complain of suprapubic pain.  Musculoskeletal is not complaining of joint pain does not complain of pain in the area of his breasts there is been no discharge from the breast.  Neurologic is not complaining of dizziness headache or syncope he does have some mild mental delay.  Psych as noted above does not complain of being anxious or depressed  Immunization History  Administered Date(s) Administered  . Influenza,inj,Quad PF,6+ Mos 12/26/2013, 07/09/2015  . Influenza-Unspecified 07/31/2017  . PPD Test 03/07/2015  . Pneumococcal Conjugate-13 07/31/2017  . Pneumococcal Polysaccharide-23 03/04/2015  . Tdap 08/22/2017   Pertinent  Health Maintenance Due  Topic Date Due  . OPHTHALMOLOGY EXAM  06/25/2018 (Originally 08/23/1956)  . URINE MICROALBUMIN  06/25/2018 (Originally 08/23/1956)  . INFLUENZA VACCINE  05/27/2018  . FOOT EXAM  07/22/2018  . HEMOGLOBIN A1C  10/22/2018  . COLONOSCOPY  04/28/2027  . PNA vac Low Risk Adult  Completed   Fall Risk  02/18/2018  Falls in the past year? No   Functional Status Survey:    Vitals:   05/25/18 1128  BP: (!) 100/58  Pulse: 76    Resp: 20  Temp: 98.6 F (37 C)  TempSrc: Oral  SpO2: 95%      Physical Exam   Pleasant middle-age male in no distress sitting comfortably in his wheelchair.  His skin is warm and dry-left breast is somewhat larger than the right breast- I do not note any discharge from either nipple-there is no tenderness to palpation of either 1-there is some mild firmness around each nipple but this appears to be baseline- no sign of infection at this point there is no warmth.  Eyes visual acuity appears to be intact sclera and conjunctive are clear.  Chest is clear to auscultation with somewhat poor respiratory effort there is no labored breathing.  Heart is regular rate and rhythm somewhat distant heart sounds he has baseline lower extremity edema which is mild a bit more on the  right versus the left.  Abdomen is soft nontender with positive bowel sounds.  Muscle skeletal moves all extremities at baseline ambulates largely in a wheelchair--occasionally he will use a walker.  Neurologic is grossly intact his speech is clear he does have some lower extremity weakness chronic.  Psych he is oriented to self pleasant appropriate does have some mild mental delay but is largely appropriate  Labs reviewed: Recent Labs    06/08/17 0519 06/10/17 0727  01/22/18 0945 04/22/18 0718 04/26/18 1236  NA 134* 137   < > 137 137 137  K 3.5 3.2*   < > 3.4* 4.3 4.8  CL 97* 100*   < > 94* 101 102  CO2 27 26   < > 21* 25 21*  GLUCOSE 96 146*   < > 143* 271* 165*  BUN 50* 34*   < > 61* 51* 62*  CREATININE 5.68* 4.96*   < > 10.63* 9.14* 11.66*  CALCIUM 8.0* 8.4*   < > 8.2* 8.9 8.6*  PHOS 5.1* 3.7  --  7.9*  --   --    < > = values in this interval not displayed.   Recent Labs    06/03/17 0055  06/10/17 0727 01/19/18 1608 01/22/18 0945  AST 16  --   --  22  --   ALT 15*  --   --  18  --   ALKPHOS 87  --   --  152*  --   BILITOT 0.5  --   --  0.8  --   PROT 6.5  --   --  6.8  --   ALBUMIN 2.4*    < > 2.1* 3.3* 3.2*   < > = values in this interval not displayed.   Recent Labs    01/19/18 0700 01/19/18 1608  01/22/18 0945 04/22/18 0718 04/26/18 1236  WBC 8.9 7.4   < > 6.2 6.6 8.7  NEUTROABS 6.8 5.3  --   --  3.5  --   HGB 11.6* 11.0*   < > 10.9* 11.7* 11.0*  HCT 36.3* 34.8*   < > 34.3* 36.0* 33.7*  MCV 106.1* 106.4*   < > 106.9* 105.6* 105.0*  PLT 145* 143*   < > 153 142* 125*   < > = values in this interval not displayed.   Lab Results  Component Value Date   TSH 3.556 05/25/2018   Lab Results  Component Value Date   HGBA1C 8.0 (H) 04/22/2018   Lab Results  Component Value Date   CHOL 89 04/22/2018   HDL 23 (L) 04/22/2018   LDLCALC NCAL 04/22/2018   TRIG 396 (H) 04/22/2018   CHOLHDL 3.9 04/22/2018    Significant Diagnostic Results in last 30 days:  Ir US Guide Vasc Access Right  Result Date: 04/26/2018 INDICATION: Clotted right upper arm AV fistula. EXAM: 1. FISTULALYSIS 2. ANGIOPLASTY OF VENOUS LIMB AND VENOUS ANASTOMOSIS 3. ULTRASOUND GUIDANCE FOR VENOUS ACCESS COMPARISON:  None. - this is the first intervention on the patient's right upper arm AV fistula MEDICATIONS: Heparin 4000 units IV; TPA 2 mg into thrombosed segment of the fistula. CONTRAST:  90 cc Isovue-300 ANESTHESIA/SEDATION: Moderate (conscious) sedation was employed during this procedure. A total of Versed 4 mg and Fentanyl 200 mcg was administered intravenously. Moderate Sedation Time: 91 minutes. [name] level of consciousness and vital signs were monitored continuously by radiology nursing throughout the procedure under my direct supervision. FLUOROSCOPY TIME:  11 minutes 12 seconds (36 mGy)  COMPLICATIONS: None immediate. TECHNIQUE: Informed written consent was obtained from the patient after a discussion of the risks, benefits and alternatives to treatment. Questions regarding the procedure were encouraged and answered. A timeout was performed prior to the initiation of the procedure. On physical  examination, a palpable post with noted within the peripheral aspect the right upper arm AV fistula. Sonographic evaluation was then performed demonstrating patency of the arterial anastomosis and peripheral arterial limb with eventual occlusion of the AV fistula at the level of the mid humerus. As such, the skin overlying the right upper arm AV fistula was draped in the usual sterile fashion, and a sterile drape was applied covering the operative field. Maximum barrier sterile technique with sterile gowns and gloves were used for the procedure. Under ultrasound guidance, the fistula was accessed directed towards the draining venous limb with a micropuncture kit after the overlying soft tissues were anesthetized with 1% lidocaine. An ultrasound image was saved for documentation purposes. The micropuncture sheath was exchange for a 7-French vascular sheath over a guidewire. Over a regular glidewire, a Kumpe catheter was advanced centrally and a central venogram was performed. Pull back venogram was performed with the Kumpe catheter. Heparin was administered systemically and TPA was administered via the Kumpe catheter into the thrombosed segment of the fistula. Next, mechanical thrombectomy was performed thrombosed segment of the fistula with the use of a Angiojet device. Next, the thrombosed segment of the venous limb was angioplastied at multiple stations with a 6 mm x 4 cm Conquest balloon. Fistulagram images was obtained. Next, an additional access was obtained directed towards the anastomoses with a micropuncture kit after the overlying soft tissues anesthetized with 1% lidocaine. This allowed for placement of a 6-French vascular sheath. Tandem areas of moderate (approximately 30%) luminal narrowing within the arterial limb was angioplastied with a 4 mm x 4 cm mustang balloon. Next, several rounds of push-pull mechanical thrombectomy was performed with the use of an occlusion balloon followed by several  additional rounds of mechanical thrombectomy with the use of an Angiojet device. Completion fistula images were obtained and the procedure was terminated. All wires, catheters and sheaths were removed from the patient. Hemostasis was achieved at both access sites with deployment of a swizzle sutures which will be removed at the patient's next dialysis session. Dressings were placed. The patient tolerated the procedure without immediate postprocedural complication. FINDINGS: Preprocedural sonographic evaluation of the patient's right upper arm brachial-basilic AV fistula demonstrates patency of the arterial anastomosis and arterial limb with eventual occlusion of the AV fistula at the level of the mid humerus. Flow was ultimately restored to the right upper arm AV fistula utilizing a combination pharmacological and mechanical thrombectomy, including with the use of an Angiojet device, as detailed above. Balloon angioplasty was performed at multiple overlapping stations throughout the venous limb to 6 mm diameter. Reflux fistulagram demonstrated 2 focal moderate (approximately 50%) luminal narrowings of the arterial limb which was successfully treated with balloon angioplastied to 4 mm diameter. Completion fistulagram images demonstrate improved flow through the dialysis fistula with a moderate amount of residual nonocclusive mural thrombus within the dominant stick zone of the fistula. On physical examination, easily palpable pulses are noted throughout the entirety of the fistula. The central venous system of the right upper extremity is widely patent to the level the superior aspect the right atrium. IMPRESSION: 1. Technically successful attempted right upper arm brachial-basilic AV fistula thrombolysis. 2. Successful angioplasty of the venous limb to 6 mm diameter.  3. Successful angioplasty of the arterial limb to 4 mm diameter. 4. Completion shuntogram demonstrate restored patency to the right upper arm  brachial-basilic AV fistula with moderate amount of non occlusive potentially chronic mural thrombus within the stick zone of the AV fistula, though the fistula now has an easily palpable pulse 5. The central venous system is widely patent. ACCESS: - While a moderate amount of nonocclusive potentially chronic mural thrombus remains within the stick zone of the fistula, the fistula demonstrates an easily palpable pulse and as such, should be utilized for attempted dialysis access. - If the fistula were to experience early rethrombosis (within the next 1-2 months), would recommend surgical revision. Above findings discussed with Lyles, Nephrology PA, at the time of procedure completion. Electronically Signed   By: Sandi Mariscal M.D.   On: 04/26/2018 17:08   Ir Thrombectomy Av Fistula/w Thrombolysis/pta Inc Shunt/img Right  Result Date: 04/26/2018 INDICATION: Clotted right upper arm AV fistula. EXAM: 1. FISTULALYSIS 2. ANGIOPLASTY OF VENOUS LIMB AND VENOUS ANASTOMOSIS 3. ULTRASOUND GUIDANCE FOR VENOUS ACCESS COMPARISON:  None. - this is the first intervention on the patient's right upper arm AV fistula MEDICATIONS: Heparin 4000 units IV; TPA 2 mg into thrombosed segment of the fistula. CONTRAST:  90 cc Isovue-300 ANESTHESIA/SEDATION: Moderate (conscious) sedation was employed during this procedure. A total of Versed 4 mg and Fentanyl 200 mcg was administered intravenously. Moderate Sedation Time: 91 minutes. [name] level of consciousness and vital signs were monitored continuously by radiology nursing throughout the procedure under my direct supervision. FLUOROSCOPY TIME:  11 minutes 12 seconds (36 mGy) COMPLICATIONS: None immediate. TECHNIQUE: Informed written consent was obtained from the patient after a discussion of the risks, benefits and alternatives to treatment. Questions regarding the procedure were encouraged and answered. A timeout was performed prior to the initiation of the procedure. On physical  examination, a palpable post with noted within the peripheral aspect the right upper arm AV fistula. Sonographic evaluation was then performed demonstrating patency of the arterial anastomosis and peripheral arterial limb with eventual occlusion of the AV fistula at the level of the mid humerus. As such, the skin overlying the right upper arm AV fistula was draped in the usual sterile fashion, and a sterile drape was applied covering the operative field. Maximum barrier sterile technique with sterile gowns and gloves were used for the procedure. Under ultrasound guidance, the fistula was accessed directed towards the draining venous limb with a micropuncture kit after the overlying soft tissues were anesthetized with 1% lidocaine. An ultrasound image was saved for documentation purposes. The micropuncture sheath was exchange for a 7-French vascular sheath over a guidewire. Over a regular glidewire, a Kumpe catheter was advanced centrally and a central venogram was performed. Pull back venogram was performed with the Kumpe catheter. Heparin was administered systemically and TPA was administered via the Kumpe catheter into the thrombosed segment of the fistula. Next, mechanical thrombectomy was performed thrombosed segment of the fistula with the use of a Angiojet device. Next, the thrombosed segment of the venous limb was angioplastied at multiple stations with a 6 mm x 4 cm Conquest balloon. Fistulagram images was obtained. Next, an additional access was obtained directed towards the anastomoses with a micropuncture kit after the overlying soft tissues anesthetized with 1% lidocaine. This allowed for placement of a 6-French vascular sheath. Tandem areas of moderate (approximately 30%) luminal narrowing within the arterial limb was angioplastied with a 4 mm x 4 cm mustang balloon. Next, several rounds of  push-pull mechanical thrombectomy was performed with the use of an occlusion balloon followed by several  additional rounds of mechanical thrombectomy with the use of an Angiojet device. Completion fistula images were obtained and the procedure was terminated. All wires, catheters and sheaths were removed from the patient. Hemostasis was achieved at both access sites with deployment of a swizzle sutures which will be removed at the patient's next dialysis session. Dressings were placed. The patient tolerated the procedure without immediate postprocedural complication. FINDINGS: Preprocedural sonographic evaluation of the patient's right upper arm brachial-basilic AV fistula demonstrates patency of the arterial anastomosis and arterial limb with eventual occlusion of the AV fistula at the level of the mid humerus. Flow was ultimately restored to the right upper arm AV fistula utilizing a combination pharmacological and mechanical thrombectomy, including with the use of an Angiojet device, as detailed above. Balloon angioplasty was performed at multiple overlapping stations throughout the venous limb to 6 mm diameter. Reflux fistulagram demonstrated 2 focal moderate (approximately 50%) luminal narrowings of the arterial limb which was successfully treated with balloon angioplastied to 4 mm diameter. Completion fistulagram images demonstrate improved flow through the dialysis fistula with a moderate amount of residual nonocclusive mural thrombus within the dominant stick zone of the fistula. On physical examination, easily palpable pulses are noted throughout the entirety of the fistula. The central venous system of the right upper extremity is widely patent to the level the superior aspect the right atrium. IMPRESSION: 1. Technically successful attempted right upper arm brachial-basilic AV fistula thrombolysis. 2. Successful angioplasty of the venous limb to 6 mm diameter. 3. Successful angioplasty of the arterial limb to 4 mm diameter. 4. Completion shuntogram demonstrate restored patency to the right upper arm  brachial-basilic AV fistula with moderate amount of non occlusive potentially chronic mural thrombus within the stick zone of the AV fistula, though the fistula now has an easily palpable pulse 5. The central venous system is widely patent. ACCESS: - While a moderate amount of nonocclusive potentially chronic mural thrombus remains within the stick zone of the fistula, the fistula demonstrates an easily palpable pulse and as such, should be utilized for attempted dialysis access. - If the fistula were to experience early rethrombosis (within the next 1-2 months), would recommend surgical revision. Above findings discussed with Lyles, Nephrology PA, at the time of procedure completion. Electronically Signed   By: Sandi Mariscal M.D.   On: 04/26/2018 17:08    Assessment/Plan  #1 enlarged left breast-does not clinically present as mastitis will start with an x-ray of the area-possibly may need an ultrasound pending x-ray results- he does not appear to show signs of infection at this point appears to be at his baseline will monitor   #2 history of hypotension he continues on Midodrine on dialysis days- this appears stable blood pressure today with systolic of 673 appears relatively baseline he has been asymptomatic   [id number]-of note greater than 25 minutes spent assessing patient discussing status with nursing staff reviewing his chart and labs- and coordinating a plan of care

## 2018-05-26 DIAGNOSIS — N2581 Secondary hyperparathyroidism of renal origin: Secondary | ICD-10-CM | POA: Diagnosis not present

## 2018-05-26 DIAGNOSIS — D631 Anemia in chronic kidney disease: Secondary | ICD-10-CM | POA: Diagnosis not present

## 2018-05-26 DIAGNOSIS — D509 Iron deficiency anemia, unspecified: Secondary | ICD-10-CM | POA: Diagnosis not present

## 2018-05-26 DIAGNOSIS — N186 End stage renal disease: Secondary | ICD-10-CM | POA: Diagnosis not present

## 2018-05-26 DIAGNOSIS — Z992 Dependence on renal dialysis: Secondary | ICD-10-CM | POA: Diagnosis not present

## 2018-05-28 DIAGNOSIS — Z992 Dependence on renal dialysis: Secondary | ICD-10-CM | POA: Diagnosis not present

## 2018-05-28 DIAGNOSIS — N186 End stage renal disease: Secondary | ICD-10-CM | POA: Diagnosis not present

## 2018-05-28 DIAGNOSIS — D509 Iron deficiency anemia, unspecified: Secondary | ICD-10-CM | POA: Diagnosis not present

## 2018-05-28 DIAGNOSIS — D631 Anemia in chronic kidney disease: Secondary | ICD-10-CM | POA: Diagnosis not present

## 2018-05-28 DIAGNOSIS — N2581 Secondary hyperparathyroidism of renal origin: Secondary | ICD-10-CM | POA: Diagnosis not present

## 2018-05-31 DIAGNOSIS — D631 Anemia in chronic kidney disease: Secondary | ICD-10-CM | POA: Diagnosis not present

## 2018-05-31 DIAGNOSIS — N186 End stage renal disease: Secondary | ICD-10-CM | POA: Diagnosis not present

## 2018-05-31 DIAGNOSIS — N2581 Secondary hyperparathyroidism of renal origin: Secondary | ICD-10-CM | POA: Diagnosis not present

## 2018-05-31 DIAGNOSIS — Z992 Dependence on renal dialysis: Secondary | ICD-10-CM | POA: Diagnosis not present

## 2018-05-31 DIAGNOSIS — D509 Iron deficiency anemia, unspecified: Secondary | ICD-10-CM | POA: Diagnosis not present

## 2018-06-02 DIAGNOSIS — N186 End stage renal disease: Secondary | ICD-10-CM | POA: Diagnosis not present

## 2018-06-02 DIAGNOSIS — D631 Anemia in chronic kidney disease: Secondary | ICD-10-CM | POA: Diagnosis not present

## 2018-06-02 DIAGNOSIS — D509 Iron deficiency anemia, unspecified: Secondary | ICD-10-CM | POA: Diagnosis not present

## 2018-06-02 DIAGNOSIS — N2581 Secondary hyperparathyroidism of renal origin: Secondary | ICD-10-CM | POA: Diagnosis not present

## 2018-06-02 DIAGNOSIS — Z992 Dependence on renal dialysis: Secondary | ICD-10-CM | POA: Diagnosis not present

## 2018-06-04 DIAGNOSIS — Z992 Dependence on renal dialysis: Secondary | ICD-10-CM | POA: Diagnosis not present

## 2018-06-04 DIAGNOSIS — D631 Anemia in chronic kidney disease: Secondary | ICD-10-CM | POA: Diagnosis not present

## 2018-06-04 DIAGNOSIS — N186 End stage renal disease: Secondary | ICD-10-CM | POA: Diagnosis not present

## 2018-06-04 DIAGNOSIS — D509 Iron deficiency anemia, unspecified: Secondary | ICD-10-CM | POA: Diagnosis not present

## 2018-06-04 DIAGNOSIS — N2581 Secondary hyperparathyroidism of renal origin: Secondary | ICD-10-CM | POA: Diagnosis not present

## 2018-06-07 DIAGNOSIS — N186 End stage renal disease: Secondary | ICD-10-CM | POA: Diagnosis not present

## 2018-06-07 DIAGNOSIS — D631 Anemia in chronic kidney disease: Secondary | ICD-10-CM | POA: Diagnosis not present

## 2018-06-07 DIAGNOSIS — Z992 Dependence on renal dialysis: Secondary | ICD-10-CM | POA: Diagnosis not present

## 2018-06-07 DIAGNOSIS — N2581 Secondary hyperparathyroidism of renal origin: Secondary | ICD-10-CM | POA: Diagnosis not present

## 2018-06-07 DIAGNOSIS — D509 Iron deficiency anemia, unspecified: Secondary | ICD-10-CM | POA: Diagnosis not present

## 2018-06-08 ENCOUNTER — Ambulatory Visit (HOSPITAL_COMMUNITY): Payer: Medicare Other

## 2018-06-08 ENCOUNTER — Ambulatory Visit (HOSPITAL_COMMUNITY)
Admission: RE | Admit: 2018-06-08 | Discharge: 2018-06-08 | Disposition: A | Discharge status: Home / Self Care | Payer: Medicare Other | Source: Ambulatory Visit | Attending: Internal Medicine | Admitting: Internal Medicine

## 2018-06-08 ENCOUNTER — Non-Acute Institutional Stay: Payer: Self-pay | Admitting: Radiology

## 2018-06-08 ENCOUNTER — Ambulatory Visit (HOSPITAL_COMMUNITY): Admit: 2018-06-08 | Payer: Medicare Other

## 2018-06-08 DIAGNOSIS — N62 Hypertrophy of breast: Secondary | ICD-10-CM | POA: Insufficient documentation

## 2018-06-08 DIAGNOSIS — N6489 Other specified disorders of breast: Secondary | ICD-10-CM | POA: Diagnosis not present

## 2018-06-08 DIAGNOSIS — N61 Mastitis without abscess: Secondary | ICD-10-CM | POA: Insufficient documentation

## 2018-06-09 DIAGNOSIS — Z992 Dependence on renal dialysis: Secondary | ICD-10-CM | POA: Diagnosis not present

## 2018-06-09 DIAGNOSIS — D631 Anemia in chronic kidney disease: Secondary | ICD-10-CM | POA: Diagnosis not present

## 2018-06-09 DIAGNOSIS — N186 End stage renal disease: Secondary | ICD-10-CM | POA: Diagnosis not present

## 2018-06-09 DIAGNOSIS — D509 Iron deficiency anemia, unspecified: Secondary | ICD-10-CM | POA: Diagnosis not present

## 2018-06-09 DIAGNOSIS — N2581 Secondary hyperparathyroidism of renal origin: Secondary | ICD-10-CM | POA: Diagnosis not present

## 2018-06-11 DIAGNOSIS — D631 Anemia in chronic kidney disease: Secondary | ICD-10-CM | POA: Diagnosis not present

## 2018-06-11 DIAGNOSIS — Z992 Dependence on renal dialysis: Secondary | ICD-10-CM | POA: Diagnosis not present

## 2018-06-11 DIAGNOSIS — N2581 Secondary hyperparathyroidism of renal origin: Secondary | ICD-10-CM | POA: Diagnosis not present

## 2018-06-11 DIAGNOSIS — N186 End stage renal disease: Secondary | ICD-10-CM | POA: Diagnosis not present

## 2018-06-11 DIAGNOSIS — D509 Iron deficiency anemia, unspecified: Secondary | ICD-10-CM | POA: Diagnosis not present

## 2018-06-14 DIAGNOSIS — N2581 Secondary hyperparathyroidism of renal origin: Secondary | ICD-10-CM | POA: Diagnosis not present

## 2018-06-14 DIAGNOSIS — N186 End stage renal disease: Secondary | ICD-10-CM | POA: Diagnosis not present

## 2018-06-14 DIAGNOSIS — D631 Anemia in chronic kidney disease: Secondary | ICD-10-CM | POA: Diagnosis not present

## 2018-06-14 DIAGNOSIS — Z992 Dependence on renal dialysis: Secondary | ICD-10-CM | POA: Diagnosis not present

## 2018-06-14 DIAGNOSIS — D509 Iron deficiency anemia, unspecified: Secondary | ICD-10-CM | POA: Diagnosis not present

## 2018-06-16 DIAGNOSIS — N2581 Secondary hyperparathyroidism of renal origin: Secondary | ICD-10-CM | POA: Diagnosis not present

## 2018-06-16 DIAGNOSIS — D509 Iron deficiency anemia, unspecified: Secondary | ICD-10-CM | POA: Diagnosis not present

## 2018-06-16 DIAGNOSIS — Z992 Dependence on renal dialysis: Secondary | ICD-10-CM | POA: Diagnosis not present

## 2018-06-16 DIAGNOSIS — D631 Anemia in chronic kidney disease: Secondary | ICD-10-CM | POA: Diagnosis not present

## 2018-06-16 DIAGNOSIS — N186 End stage renal disease: Secondary | ICD-10-CM | POA: Diagnosis not present

## 2018-06-18 DIAGNOSIS — N186 End stage renal disease: Secondary | ICD-10-CM | POA: Diagnosis not present

## 2018-06-18 DIAGNOSIS — D509 Iron deficiency anemia, unspecified: Secondary | ICD-10-CM | POA: Diagnosis not present

## 2018-06-18 DIAGNOSIS — Z992 Dependence on renal dialysis: Secondary | ICD-10-CM | POA: Diagnosis not present

## 2018-06-18 DIAGNOSIS — D631 Anemia in chronic kidney disease: Secondary | ICD-10-CM | POA: Diagnosis not present

## 2018-06-18 DIAGNOSIS — N2581 Secondary hyperparathyroidism of renal origin: Secondary | ICD-10-CM | POA: Diagnosis not present

## 2018-06-21 DIAGNOSIS — D509 Iron deficiency anemia, unspecified: Secondary | ICD-10-CM | POA: Diagnosis not present

## 2018-06-21 DIAGNOSIS — Z992 Dependence on renal dialysis: Secondary | ICD-10-CM | POA: Diagnosis not present

## 2018-06-21 DIAGNOSIS — D631 Anemia in chronic kidney disease: Secondary | ICD-10-CM | POA: Diagnosis not present

## 2018-06-21 DIAGNOSIS — N186 End stage renal disease: Secondary | ICD-10-CM | POA: Diagnosis not present

## 2018-06-21 DIAGNOSIS — N2581 Secondary hyperparathyroidism of renal origin: Secondary | ICD-10-CM | POA: Diagnosis not present

## 2018-06-23 DIAGNOSIS — D631 Anemia in chronic kidney disease: Secondary | ICD-10-CM | POA: Diagnosis not present

## 2018-06-23 DIAGNOSIS — D509 Iron deficiency anemia, unspecified: Secondary | ICD-10-CM | POA: Diagnosis not present

## 2018-06-23 DIAGNOSIS — N186 End stage renal disease: Secondary | ICD-10-CM | POA: Diagnosis not present

## 2018-06-23 DIAGNOSIS — N2581 Secondary hyperparathyroidism of renal origin: Secondary | ICD-10-CM | POA: Diagnosis not present

## 2018-06-23 DIAGNOSIS — Z992 Dependence on renal dialysis: Secondary | ICD-10-CM | POA: Diagnosis not present

## 2018-06-25 DIAGNOSIS — N2581 Secondary hyperparathyroidism of renal origin: Secondary | ICD-10-CM | POA: Diagnosis not present

## 2018-06-25 DIAGNOSIS — D509 Iron deficiency anemia, unspecified: Secondary | ICD-10-CM | POA: Diagnosis not present

## 2018-06-25 DIAGNOSIS — D631 Anemia in chronic kidney disease: Secondary | ICD-10-CM | POA: Diagnosis not present

## 2018-06-25 DIAGNOSIS — N186 End stage renal disease: Secondary | ICD-10-CM | POA: Diagnosis not present

## 2018-06-25 DIAGNOSIS — Z992 Dependence on renal dialysis: Secondary | ICD-10-CM | POA: Diagnosis not present

## 2018-06-26 DIAGNOSIS — N186 End stage renal disease: Secondary | ICD-10-CM | POA: Diagnosis not present

## 2018-06-26 DIAGNOSIS — Z992 Dependence on renal dialysis: Secondary | ICD-10-CM | POA: Diagnosis not present

## 2018-06-28 DIAGNOSIS — Z992 Dependence on renal dialysis: Secondary | ICD-10-CM | POA: Diagnosis not present

## 2018-06-28 DIAGNOSIS — N2581 Secondary hyperparathyroidism of renal origin: Secondary | ICD-10-CM | POA: Diagnosis not present

## 2018-06-28 DIAGNOSIS — D631 Anemia in chronic kidney disease: Secondary | ICD-10-CM | POA: Diagnosis not present

## 2018-06-28 DIAGNOSIS — N186 End stage renal disease: Secondary | ICD-10-CM | POA: Diagnosis not present

## 2018-06-28 DIAGNOSIS — D509 Iron deficiency anemia, unspecified: Secondary | ICD-10-CM | POA: Diagnosis not present

## 2018-06-30 DIAGNOSIS — D631 Anemia in chronic kidney disease: Secondary | ICD-10-CM | POA: Diagnosis not present

## 2018-06-30 DIAGNOSIS — Z992 Dependence on renal dialysis: Secondary | ICD-10-CM | POA: Diagnosis not present

## 2018-06-30 DIAGNOSIS — N2581 Secondary hyperparathyroidism of renal origin: Secondary | ICD-10-CM | POA: Diagnosis not present

## 2018-06-30 DIAGNOSIS — N186 End stage renal disease: Secondary | ICD-10-CM | POA: Diagnosis not present

## 2018-06-30 DIAGNOSIS — D509 Iron deficiency anemia, unspecified: Secondary | ICD-10-CM | POA: Diagnosis not present

## 2018-07-02 ENCOUNTER — Encounter (HOSPITAL_COMMUNITY)
Admission: RE | Admit: 2018-07-02 | Discharge: 2018-07-02 | Disposition: A | Discharge status: Home / Self Care | Payer: Medicare Other | Source: Skilled Nursing Facility | Attending: Internal Medicine | Admitting: Internal Medicine

## 2018-07-02 DIAGNOSIS — E1129 Type 2 diabetes mellitus with other diabetic kidney complication: Secondary | ICD-10-CM | POA: Insufficient documentation

## 2018-07-02 DIAGNOSIS — D631 Anemia in chronic kidney disease: Secondary | ICD-10-CM | POA: Diagnosis not present

## 2018-07-02 DIAGNOSIS — D509 Iron deficiency anemia, unspecified: Secondary | ICD-10-CM | POA: Diagnosis not present

## 2018-07-02 DIAGNOSIS — E785 Hyperlipidemia, unspecified: Secondary | ICD-10-CM | POA: Insufficient documentation

## 2018-07-02 DIAGNOSIS — N186 End stage renal disease: Secondary | ICD-10-CM | POA: Diagnosis not present

## 2018-07-02 DIAGNOSIS — D500 Iron deficiency anemia secondary to blood loss (chronic): Secondary | ICD-10-CM | POA: Insufficient documentation

## 2018-07-02 DIAGNOSIS — Z992 Dependence on renal dialysis: Secondary | ICD-10-CM | POA: Diagnosis not present

## 2018-07-02 DIAGNOSIS — N185 Chronic kidney disease, stage 5: Secondary | ICD-10-CM | POA: Insufficient documentation

## 2018-07-02 DIAGNOSIS — I120 Hypertensive chronic kidney disease with stage 5 chronic kidney disease or end stage renal disease: Secondary | ICD-10-CM | POA: Insufficient documentation

## 2018-07-02 DIAGNOSIS — N2581 Secondary hyperparathyroidism of renal origin: Secondary | ICD-10-CM | POA: Diagnosis not present

## 2018-07-03 ENCOUNTER — Encounter (HOSPITAL_COMMUNITY)
Admission: AD | Admit: 2018-07-03 | Discharge: 2018-07-03 | Disposition: A | Discharge status: Home / Self Care | Payer: Medicare Other | Source: Skilled Nursing Facility

## 2018-07-03 DIAGNOSIS — E1129 Type 2 diabetes mellitus with other diabetic kidney complication: Secondary | ICD-10-CM | POA: Diagnosis not present

## 2018-07-03 DIAGNOSIS — N185 Chronic kidney disease, stage 5: Secondary | ICD-10-CM | POA: Diagnosis not present

## 2018-07-03 DIAGNOSIS — E785 Hyperlipidemia, unspecified: Secondary | ICD-10-CM | POA: Diagnosis not present

## 2018-07-03 DIAGNOSIS — I120 Hypertensive chronic kidney disease with stage 5 chronic kidney disease or end stage renal disease: Secondary | ICD-10-CM | POA: Diagnosis not present

## 2018-07-03 DIAGNOSIS — D500 Iron deficiency anemia secondary to blood loss (chronic): Secondary | ICD-10-CM | POA: Diagnosis not present

## 2018-07-03 LAB — LIPID PANEL
Cholesterol: 80 mg/dL (ref 0–200)
HDL: 19 mg/dL — ABNORMAL LOW (ref >40)
LDL Cholesterol: UNDETERMINED mg/dL (ref 0–99)
Total CHOL/HDL Ratio: 4.2 RATIO
Triglycerides: 481 mg/dL — ABNORMAL HIGH (ref <150)
VLDL: UNDETERMINED mg/dL (ref 0–40)

## 2018-07-05 DIAGNOSIS — N186 End stage renal disease: Secondary | ICD-10-CM | POA: Diagnosis not present

## 2018-07-05 DIAGNOSIS — D509 Iron deficiency anemia, unspecified: Secondary | ICD-10-CM | POA: Diagnosis not present

## 2018-07-05 DIAGNOSIS — D631 Anemia in chronic kidney disease: Secondary | ICD-10-CM | POA: Diagnosis not present

## 2018-07-05 DIAGNOSIS — N2581 Secondary hyperparathyroidism of renal origin: Secondary | ICD-10-CM | POA: Diagnosis not present

## 2018-07-05 DIAGNOSIS — Z992 Dependence on renal dialysis: Secondary | ICD-10-CM | POA: Diagnosis not present

## 2018-07-07 DIAGNOSIS — Z992 Dependence on renal dialysis: Secondary | ICD-10-CM | POA: Diagnosis not present

## 2018-07-07 DIAGNOSIS — D631 Anemia in chronic kidney disease: Secondary | ICD-10-CM | POA: Diagnosis not present

## 2018-07-07 DIAGNOSIS — N2581 Secondary hyperparathyroidism of renal origin: Secondary | ICD-10-CM | POA: Diagnosis not present

## 2018-07-07 DIAGNOSIS — N186 End stage renal disease: Secondary | ICD-10-CM | POA: Diagnosis not present

## 2018-07-07 DIAGNOSIS — D509 Iron deficiency anemia, unspecified: Secondary | ICD-10-CM | POA: Diagnosis not present

## 2018-07-09 DIAGNOSIS — D631 Anemia in chronic kidney disease: Secondary | ICD-10-CM | POA: Diagnosis not present

## 2018-07-09 DIAGNOSIS — N2581 Secondary hyperparathyroidism of renal origin: Secondary | ICD-10-CM | POA: Diagnosis not present

## 2018-07-09 DIAGNOSIS — Z992 Dependence on renal dialysis: Secondary | ICD-10-CM | POA: Diagnosis not present

## 2018-07-09 DIAGNOSIS — N186 End stage renal disease: Secondary | ICD-10-CM | POA: Diagnosis not present

## 2018-07-09 DIAGNOSIS — D509 Iron deficiency anemia, unspecified: Secondary | ICD-10-CM | POA: Diagnosis not present

## 2018-07-12 DIAGNOSIS — N186 End stage renal disease: Secondary | ICD-10-CM | POA: Diagnosis not present

## 2018-07-12 DIAGNOSIS — Z992 Dependence on renal dialysis: Secondary | ICD-10-CM | POA: Diagnosis not present

## 2018-07-12 DIAGNOSIS — N2581 Secondary hyperparathyroidism of renal origin: Secondary | ICD-10-CM | POA: Diagnosis not present

## 2018-07-12 DIAGNOSIS — D509 Iron deficiency anemia, unspecified: Secondary | ICD-10-CM | POA: Diagnosis not present

## 2018-07-12 DIAGNOSIS — D631 Anemia in chronic kidney disease: Secondary | ICD-10-CM | POA: Diagnosis not present

## 2018-07-14 DIAGNOSIS — D509 Iron deficiency anemia, unspecified: Secondary | ICD-10-CM | POA: Diagnosis not present

## 2018-07-14 DIAGNOSIS — N186 End stage renal disease: Secondary | ICD-10-CM | POA: Diagnosis not present

## 2018-07-14 DIAGNOSIS — Z992 Dependence on renal dialysis: Secondary | ICD-10-CM | POA: Diagnosis not present

## 2018-07-14 DIAGNOSIS — N2581 Secondary hyperparathyroidism of renal origin: Secondary | ICD-10-CM | POA: Diagnosis not present

## 2018-07-14 DIAGNOSIS — D631 Anemia in chronic kidney disease: Secondary | ICD-10-CM | POA: Diagnosis not present

## 2018-07-16 DIAGNOSIS — N186 End stage renal disease: Secondary | ICD-10-CM | POA: Diagnosis not present

## 2018-07-16 DIAGNOSIS — Z992 Dependence on renal dialysis: Secondary | ICD-10-CM | POA: Diagnosis not present

## 2018-07-16 DIAGNOSIS — N2581 Secondary hyperparathyroidism of renal origin: Secondary | ICD-10-CM | POA: Diagnosis not present

## 2018-07-16 DIAGNOSIS — D631 Anemia in chronic kidney disease: Secondary | ICD-10-CM | POA: Diagnosis not present

## 2018-07-16 DIAGNOSIS — D509 Iron deficiency anemia, unspecified: Secondary | ICD-10-CM | POA: Diagnosis not present

## 2018-07-19 DIAGNOSIS — D509 Iron deficiency anemia, unspecified: Secondary | ICD-10-CM | POA: Diagnosis not present

## 2018-07-19 DIAGNOSIS — N186 End stage renal disease: Secondary | ICD-10-CM | POA: Diagnosis not present

## 2018-07-19 DIAGNOSIS — N2581 Secondary hyperparathyroidism of renal origin: Secondary | ICD-10-CM | POA: Diagnosis not present

## 2018-07-19 DIAGNOSIS — Z992 Dependence on renal dialysis: Secondary | ICD-10-CM | POA: Diagnosis not present

## 2018-07-19 DIAGNOSIS — D631 Anemia in chronic kidney disease: Secondary | ICD-10-CM | POA: Diagnosis not present

## 2018-07-21 DIAGNOSIS — Z992 Dependence on renal dialysis: Secondary | ICD-10-CM | POA: Diagnosis not present

## 2018-07-21 DIAGNOSIS — N186 End stage renal disease: Secondary | ICD-10-CM | POA: Diagnosis not present

## 2018-07-21 DIAGNOSIS — D631 Anemia in chronic kidney disease: Secondary | ICD-10-CM | POA: Diagnosis not present

## 2018-07-21 DIAGNOSIS — N2581 Secondary hyperparathyroidism of renal origin: Secondary | ICD-10-CM | POA: Diagnosis not present

## 2018-07-21 DIAGNOSIS — D509 Iron deficiency anemia, unspecified: Secondary | ICD-10-CM | POA: Diagnosis not present

## 2018-07-22 ENCOUNTER — Encounter: Payer: Self-pay | Admitting: Internal Medicine

## 2018-07-22 ENCOUNTER — Non-Acute Institutional Stay (SKILLED_NURSING_FACILITY): Payer: Medicare Other | Admitting: Internal Medicine

## 2018-07-22 DIAGNOSIS — E782 Mixed hyperlipidemia: Secondary | ICD-10-CM | POA: Diagnosis not present

## 2018-07-22 DIAGNOSIS — N401 Enlarged prostate with lower urinary tract symptoms: Secondary | ICD-10-CM | POA: Diagnosis not present

## 2018-07-22 DIAGNOSIS — E1121 Type 2 diabetes mellitus with diabetic nephropathy: Secondary | ICD-10-CM | POA: Diagnosis not present

## 2018-07-22 DIAGNOSIS — N186 End stage renal disease: Secondary | ICD-10-CM | POA: Diagnosis not present

## 2018-07-22 DIAGNOSIS — D638 Anemia in other chronic diseases classified elsewhere: Secondary | ICD-10-CM

## 2018-07-22 DIAGNOSIS — Z794 Long term (current) use of insulin: Secondary | ICD-10-CM | POA: Diagnosis not present

## 2018-07-22 DIAGNOSIS — R338 Other retention of urine: Secondary | ICD-10-CM

## 2018-07-22 NOTE — Progress Notes (Signed)
Location:    [facility] Room Number: 112/D Place of Service:  SNF (31) Provider: Veleta Miners MD  [name], MD  Patient Care Team: [name], MD as PCP - General (Internal Medicine) Cassandria Anger, MD as Consulting Physician (Endocrinology) Fran Lowes, MD as Consulting Physician (Nephrology) Gala Romney Cristopher Estimable, MD as Consulting Physician (Gastroenterology)  Extended Emergency Contact Information Primary Emergency Contact: Betsey Amen Address: DSS  [place] Phone: [phone] Work Phone: [phone] Relation: Legal Guardian Secondary Emergency Contact: Schiro,Richard Address: [address]          [place], [place] [address] [place] Phone: [phone] Relation: Brother  Code Status:  Full Code Goals of care: Advanced Directive information Advanced Directives 07/22/2018  Does Patient Have a Medical Advance Directive? Yes  Type of Advance Directive (No Data)  Does patient want to make changes to medical advance directive? No - Patient declined  Copy of [place] in Chart? No - copy requested  Would patient like information on creating a medical advance directive? No - Patient declined  Pre-existing out of facility DNR order (yellow form or pink MOST form) -     Chief Complaint  Patient presents with  . Medical Management of Chronic Issues    Patient is being seen for routine visit of medical management, Due Eye exam and Albumin    HPI:  Pt is a 72 y.o. male seen today for medical management of chronic diseases.   Patient is Long term resident of facility.  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 His weight is stable at 203 lbs. He did  not have any new Nursing issues.  Only complains nurses had was that he is very non compliant with his diet. His family brings him Fast food everyday.  Past Medical History:  Diagnosis Date  . Abnormal CT scan, kidney 10/06/2011  . Acute pyelonephritis 10/07/2011  . Anemia    normocytic  . Anxiety    mental retardation  . Bladder wall thickening 10/06/2011  . BPH (benign prostatic hypertrophy)   . Diabetes mellitus   . Dialysis patient [facility])    Tuesday, Thursday and Saturday,   . Edema     history of lower extremity edema  . GERD (gastroesophageal reflux disease)   . Heme positive stool   . Hydronephrosis   . Hyperkalemia   . Hyperlipidemia   . Hypernatremia   . Hypertension   . Hypothyroidism   . Impaired speech   . 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 MR (mental retardation)   . Muscle weakness   . Obstructive uropathy   . 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 Poor historian poor historian  . Protein calorie malnutrition ([place])   . Pyelonephritis   . Renal failure (ARF), acute on chronic (HCC)   . Renal insufficiency    chronic history  . Sepsis ([place])   . Smoking   . Uremia   . Urinary retention   . UTI (lower urinary tract infection) 10/06/2011   Past Surgical History:  Procedure Laterality Date  . AV FISTULA PLACEMENT Left 07/06/2015   Procedure:  INSERTION LEFT ARM ARTERIOVENOUS GORTEX GRAFT;  Surgeon: Angelia Mould, MD;  Location: [place] [facility];  Service: Vascular;  Laterality: Left;  . AV FISTULA PLACEMENT Right  02/26/2016   Procedure: ARTERIOVENOUS (AV) FISTULA CREATION ;  Surgeon: Angelia Mould, MD;  Location: [place];  Service: Vascular;  Laterality: Right;  . [place] REMOVAL Left 10/09/2015   Procedure: REMOVAL OF ARTERIOVENOUS GORETEX GRAFT ([place]) Evacuation of Lymphocele, Vein Patch angioplasty of brachial artery.;  Surgeon: Angelia Mould, MD;  Location: [facility];  Service: Vascular;  Laterality: Left;  . BASCILIC VEIN TRANSPOSITION Right  02/26/2016   Procedure: Right BASCILIC VEIN TRANSPOSITION;  Surgeon: Angelia Mould, MD;  Location: [facility];  Service: Vascular;  Laterality: Right;  . CIRCUMCISION N/A 01/04/2014   Procedure: CIRCUMCISION ADULT (procedure #1);  Surgeon: Marissa Nestle, MD;  [facility];  Service: Urology;  Laterality: N/A;  . COLECTOMY N/A 05/04/2017   Procedure: TOTAL COLECTOMY;  Surgeon: Aviva Signs, MD;  [facility];  Service: General;  Laterality: N/A;  . COLONOSCOPY N/A 04/27/2017   Procedure: COLONOSCOPY;  Surgeon: Daneil Dolin, MD;  [facility];  Service: Endoscopy;  Laterality: N/A;  245  . CYSTOSCOPY W/ RETROGRADES Bilateral 06/29/2015   Procedure: CYSTOSCOPY, DILATION OF URETHRAL STRICTURE WITH BILATERAL RETROGRADE PYELOGRAM,SUPRAPUBIC TUBE CHANGE;  Surgeon: Festus Aloe, MD;  Location: [facility];  Service: Urology;  Laterality: Bilateral;  . CYSTOSCOPY WITH URETHRAL DILATATION N/A 12/29/2013   Procedure: CYSTOSCOPY WITH URETHRAL DILATATION;  Surgeon: Marissa Nestle, MD;  [facility];  Service: Urology;  Laterality: N/A;  . ESOPHAGOGASTRODUODENOSCOPY N/A 04/27/2017   Procedure: ESOPHAGOGASTRODUODENOSCOPY (EGD);  Surgeon: Daneil Dolin, MD;  [facility];  Service: Endoscopy;  Laterality: N/A;  . IR THROMBECTOMY AV FISTULA W/THROMBOLYSIS/PTA INC/SHUNT/IMG RIGHT Right 04/26/2018  . IR US GUIDE VASC ACCESS RIGHT  04/26/2018  . ORIF FEMUR FRACTURE Right 11/22/2016   Procedure: OPEN REDUCTION INTERNAL FIXATION (ORIF) DISTAL FEMUR FRACTURE;  Surgeon: Rod Can, MD;  Location: [facility];  Service: Orthopedics;  Laterality: Right;  . PATCH ANGIOPLASTY Right 12/10/2017   Procedure: PATCH ANGIOPLASTY;  Surgeon: Angelia Mould, MD;  Location: [facility];  Service: Vascular;  Laterality: Right;  . PERIPHERAL VASCULAR CATHETERIZATION N/A 10/08/2015   Procedure: A/V Shuntogram;  Surgeon: Angelia Mould, MD;  Location: [facility];  Service: Cardiovascular;   Laterality: N/A;  . THROMBECTOMY W/ EMBOLECTOMY Right 12/10/2017   Procedure: THROMBECTOMY REVISION RIGHT ARM  ARTERIOVENOUS FISTULA;  Surgeon: Angelia Mould, MD;  Location: [facility];  Service: Vascular;  Laterality: Right;  . TRANSURETHRAL RESECTION OF PROSTATE N/A 01/04/2014   Procedure: TRANSURETHRAL RESECTION OF THE PROSTATE (TURP) (procedure #2);  Surgeon: Marissa Nestle, MD;  [facility];  Service: Urology;  Laterality: N/A;    No Known Allergies  Outpatient Encounter Medications as of 07/22/2018  Medication Sig  . atorvastatin (LIPITOR) 40 MG tablet Take 80 mg by mouth every evening.   . Insulin Glargine (BASAGLAR KWIKPEN) 100 UNIT/ML SOPN Inject 10 Units into the skin at bedtime.  Marland Kitchen levothyroxine (SYNTHROID, LEVOTHROID) 88 MCG tablet Take 88 mcg by mouth daily before breakfast.  . linagliptin (TRADJENTA) 5 MG TABS tablet Take 5 mg by mouth every evening.   . midodrine (PROAMATINE) 10 MG tablet Take 0.5 tablets (5 mg total) by mouth every dialysis (hypotension).  . Multiple Vitamin (MULTIVITAMIN WITH MINERALS) TABS tablet Take 1 tablet by mouth every evening.   . Nutritional Supplements (NEPRO/CARBSTEADY PO) Take 1 Can by mouth 2 (two) times daily.  Marland Kitchen omeprazole (PRILOSEC) 40 MG capsule Take 40 mg by mouth at bedtime.   . polyethylene glycol (  MIRALAX / GLYCOLAX) packet Take 17 g by mouth as needed for mild constipation.   . sevelamer carbonate (RENVELA) 800 MG tablet Take 1,600-3,200 mg by mouth See admin instructions. Take 4 tablets (3200mg ) by mouth 3 times daily with meals, and 2 tablets (1600mg ) by mouth with bedtime snack  . tamsulosin (FLOMAX) 0.4 MG CAPS capsule Take 0.4 mg by mouth every evening. Give 30 minutes after a meal  . torsemide (DEMADEX) 10 MG tablet Take 5 tablets (50 mg total) by mouth daily.   No facility-administered encounter medications on file as of 07/22/2018.      Review of Systems  Review of Systems  Constitutional: Negative for activity  change, appetite change, chills, diaphoresis, fatigue and fever.  HENT: Negative for mouth sores, postnasal drip, rhinorrhea, sinus pain and sore throat.   Respiratory: Negative for apnea, cough, chest tightness, shortness of breath and wheezing.   Cardiovascular: Negative for chest pain, palpitations and leg swelling.  Gastrointestinal: Negative for abdominal distention, abdominal pain, constipation, diarrhea, nausea and vomiting.  Genitourinary: Negative for dysuria and frequency.  Musculoskeletal: Negative for arthralgias, joint swelling and myalgias.  Skin: Negative for rash.  Neurological: Negative for dizziness, syncope, weakness, light-headedness and numbness.  Psychiatric/Behavioral: Negative for behavioral problems, confusion and sleep disturbance.     Immunization History  Administered Date(s) Administered  . Influenza,inj,Quad PF,6+ Mos 12/26/2013, 07/09/2015  . Influenza-Unspecified 07/31/2017  . PPD Test 03/07/2015  . Pneumococcal Conjugate-13 07/31/2017  . Pneumococcal Polysaccharide-23 03/04/2015  . Tdap 08/22/2017   Pertinent  Health Maintenance Due  Topic Date Due  . INFLUENZA VACCINE  08/21/2018 (Originally 05/27/2018)  . OPHTHALMOLOGY EXAM  08/21/2018 (Originally 08/23/1956)  . URINE MICROALBUMIN  08/21/2018 (Originally 08/23/1956)  . HEMOGLOBIN A1C  10/22/2018  . FOOT EXAM  05/20/2019  . COLONOSCOPY  04/28/2027  . PNA vac Low Risk Adult  Completed   Fall Risk  02/18/2018  Falls in the past year? No   Functional Status Survey:    Vitals:   07/22/18 1154  BP: (!) 96/57  Pulse: 81  Resp: 20  Temp: 98.7 F (37.1 C)  TempSrc: Oral  SpO2: 92%  Weight: 203 lb 3.2 oz (92.2 kg)  Height: 5\' 8"  (1.727 m)   Body mass index is 30.9 kg/m. Physical Exam  Constitutional: He appears well-developed and well-nourished.  HENT:  Head: Normocephalic.  Mouth/Throat: Oropharynx is clear and moist.  Eyes: Pupils are equal, round, and reactive to light.  Neck: Neck  supple.  Cardiovascular: Normal rate and normal heart sounds.  Pulmonary/Chest: Effort normal and breath sounds normal. No respiratory distress. He has no wheezes.  Abdominal: Soft. Bowel sounds are normal. He exhibits no distension. There is no tenderness. There is no rebound.  Musculoskeletal: He exhibits no edema.  Neurological: He is alert.  Skin: Skin is warm and dry.  Psychiatric: He has a normal mood and affect. His behavior is normal. Thought content normal.    Labs reviewed: Recent Labs    01/22/18 0945 04/22/18 0718 04/26/18 1236  NA 137 137 137  K 3.4* 4.3 4.8  CL 94* 101 102  CO2 21* 25 21*  GLUCOSE 143* 271* 165*  BUN 61* 51* 62*  CREATININE 10.63* 9.14* 11.66*  CALCIUM 8.2* 8.9 8.6*  PHOS 7.9*  --   --    Recent Labs    01/19/18 1608 01/22/18 0945  AST 22  --   ALT 18  --   ALKPHOS 152*  --   BILITOT 0.8  --  PROT 6.8  --   ALBUMIN 3.3* 3.2*   Recent Labs    01/19/18 0700 01/19/18 1608  01/22/18 0945 04/22/18 0718 04/26/18 1236  WBC 8.9 7.4   < > 6.2 6.6 8.7  NEUTROABS 6.8 5.3  --   --  3.5  --   HGB 11.6* 11.0*   < > 10.9* 11.7* 11.0*  HCT 36.3* 34.8*   < > 34.3* 36.0* 33.7*  MCV 106.1* 106.4*   < > 106.9* 105.6* 105.0*  PLT 145* 143*   < > 153 142* 125*   < > = values in this interval not displayed.   Lab Results  Component Value Date   TSH 3.556 05/25/2018   Lab Results  Component Value Date   HGBA1C 8.0 (H) 04/22/2018   Lab Results  Component Value Date   CHOL 80 07/03/2018   HDL 19 (L) 07/03/2018   LDLCALC UNABLE TO CALCULATE IF TRIGLYCERIDE OVER 400 mg/dL 07/03/2018   TRIG 481 (H) 07/03/2018   CHOLHDL 4.2 07/03/2018    Significant Diagnostic Results in last 30 days:  No results found.  Assessment/Plan  Type 2 diabetes mellitus His BS are still running more then 200 most of the day Will Increase his Lantus to 15 units. Repeat A1C Also to d/w his family about the food choices for him  ESRD  On Dialysis Midodrine for  BP Also on Demadex  BPH Continue on Flomax  Mixed hyperlipidemia He has high triglycerides but mostly less then 500 On increase dose of Lipitor D/W him again to follow more restrictive diet S/P colectomy Appetite has improved and is doing well with his weight Benign prostatic hyperplasia  On Flomax Iron deficiency anemia, On Nepro Hgb Stable Repeat Hgb Hypothyroid Continue same dose of Synthroid TSH normal in 07/19  Patient abdominal CT scan showedin 03/19 14 mm low-density/cystic pancreatic head lesion. Follow-up pancreatic protocol abdominal CT is recommended in 2 years    Family/ staff Communication:   Labs/tests ordered:

## 2018-07-23 ENCOUNTER — Encounter (HOSPITAL_COMMUNITY)
Admission: RE | Admit: 2018-07-23 | Discharge: 2018-07-23 | Disposition: A | Discharge status: Home / Self Care | Payer: Medicare Other | Source: Skilled Nursing Facility | Attending: Internal Medicine | Admitting: Internal Medicine

## 2018-07-23 DIAGNOSIS — D509 Iron deficiency anemia, unspecified: Secondary | ICD-10-CM | POA: Diagnosis not present

## 2018-07-23 DIAGNOSIS — Z992 Dependence on renal dialysis: Secondary | ICD-10-CM | POA: Diagnosis not present

## 2018-07-23 DIAGNOSIS — D500 Iron deficiency anemia secondary to blood loss (chronic): Secondary | ICD-10-CM | POA: Insufficient documentation

## 2018-07-23 DIAGNOSIS — E1129 Type 2 diabetes mellitus with other diabetic kidney complication: Secondary | ICD-10-CM | POA: Insufficient documentation

## 2018-07-23 DIAGNOSIS — N186 End stage renal disease: Secondary | ICD-10-CM | POA: Diagnosis not present

## 2018-07-23 DIAGNOSIS — I120 Hypertensive chronic kidney disease with stage 5 chronic kidney disease or end stage renal disease: Secondary | ICD-10-CM | POA: Insufficient documentation

## 2018-07-23 DIAGNOSIS — N2581 Secondary hyperparathyroidism of renal origin: Secondary | ICD-10-CM | POA: Diagnosis not present

## 2018-07-23 DIAGNOSIS — D631 Anemia in chronic kidney disease: Secondary | ICD-10-CM | POA: Diagnosis not present

## 2018-07-24 ENCOUNTER — Encounter (HOSPITAL_COMMUNITY)
Admission: RE | Admit: 2018-07-24 | Discharge: 2018-07-24 | Disposition: A | Discharge status: Home / Self Care | Payer: Medicare Other | Source: Skilled Nursing Facility

## 2018-07-24 DIAGNOSIS — E1129 Type 2 diabetes mellitus with other diabetic kidney complication: Secondary | ICD-10-CM | POA: Diagnosis not present

## 2018-07-24 DIAGNOSIS — I120 Hypertensive chronic kidney disease with stage 5 chronic kidney disease or end stage renal disease: Secondary | ICD-10-CM | POA: Diagnosis not present

## 2018-07-24 DIAGNOSIS — F4322 Adjustment disorder with anxiety: Secondary | ICD-10-CM | POA: Diagnosis not present

## 2018-07-24 DIAGNOSIS — D500 Iron deficiency anemia secondary to blood loss (chronic): Secondary | ICD-10-CM | POA: Diagnosis not present

## 2018-07-24 DIAGNOSIS — N185 Chronic kidney disease, stage 5: Secondary | ICD-10-CM | POA: Diagnosis not present

## 2018-07-24 DIAGNOSIS — E785 Hyperlipidemia, unspecified: Secondary | ICD-10-CM | POA: Diagnosis not present

## 2018-07-24 DIAGNOSIS — F70 Mild intellectual disabilities: Secondary | ICD-10-CM | POA: Diagnosis not present

## 2018-07-24 LAB — BASIC METABOLIC PANEL
Anion gap: 11 (ref 5–15)
BUN: 29 mg/dL — ABNORMAL HIGH (ref 8–23)
CO2: 26 mmol/L (ref 22–32)
Calcium: 8.9 mg/dL (ref 8.9–10.3)
Chloride: 99 mmol/L (ref 98–111)
Creatinine, Ser: 8.42 mg/dL — ABNORMAL HIGH (ref 0.61–1.24)
GFR calc Af Amer: 7 mL/min — ABNORMAL LOW (ref >60)
GFR calc non Af Amer: 6 mL/min — ABNORMAL LOW (ref >60)
Glucose, Bld: 215 mg/dL — ABNORMAL HIGH (ref 70–99)
Potassium: 4.7 mmol/L (ref 3.5–5.1)
Sodium: 136 mmol/L (ref 135–145)

## 2018-07-24 LAB — CBC
HCT: 34.6 % — ABNORMAL LOW (ref 39.0–52.0)
Hemoglobin: 11.4 g/dL — ABNORMAL LOW (ref 13.0–17.0)
MCH: 35.3 pg — ABNORMAL HIGH (ref 26.0–34.0)
MCHC: 32.9 g/dL (ref 30.0–36.0)
MCV: 107.1 fL — ABNORMAL HIGH (ref 78.0–100.0)
Platelets: 128 10*3/uL — ABNORMAL LOW (ref 150–400)
RBC: 3.23 MIL/uL — ABNORMAL LOW (ref 4.22–5.81)
RDW: 14 % (ref 11.5–15.5)
WBC: 5.4 10*3/uL (ref 4.0–10.5)

## 2018-07-24 LAB — HEMOGLOBIN A1C
Hgb A1c MFr Bld: 8.5 % — ABNORMAL HIGH (ref 4.8–5.6)
Mean Plasma Glucose: 197.25 mg/dL

## 2018-07-26 DIAGNOSIS — Z992 Dependence on renal dialysis: Secondary | ICD-10-CM | POA: Diagnosis not present

## 2018-07-26 DIAGNOSIS — N186 End stage renal disease: Secondary | ICD-10-CM | POA: Diagnosis not present

## 2018-07-26 DIAGNOSIS — D509 Iron deficiency anemia, unspecified: Secondary | ICD-10-CM | POA: Diagnosis not present

## 2018-07-26 DIAGNOSIS — D631 Anemia in chronic kidney disease: Secondary | ICD-10-CM | POA: Diagnosis not present

## 2018-07-26 DIAGNOSIS — N2581 Secondary hyperparathyroidism of renal origin: Secondary | ICD-10-CM | POA: Diagnosis not present

## 2018-07-26 IMAGING — US US EXTREM LOW VENOUS*R*
1 series · 13 of 24 positions shown · non-contrast
Comparison: None.

CLINICAL DATA: History of deep venous thrombosis. Right lower
extremity swelling and warmth.



[Series 1: us extrem low venous*right* · 0.09mm/px · 13 of 32 slices shown]
[im 1/32]
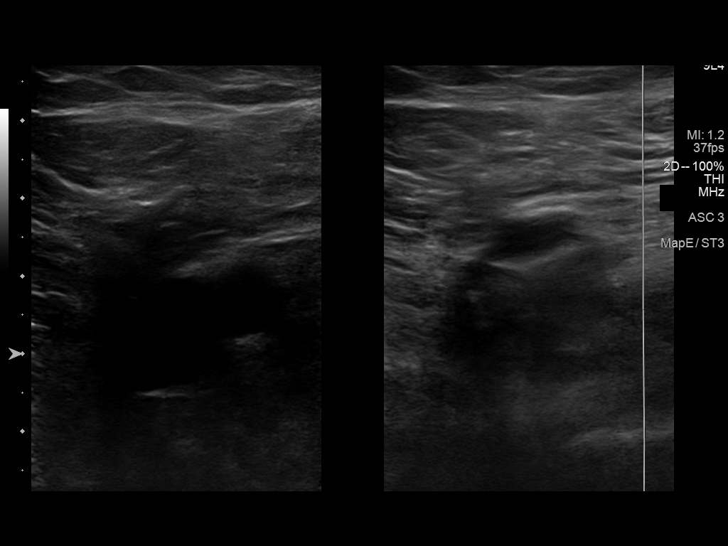
[im 3/32]
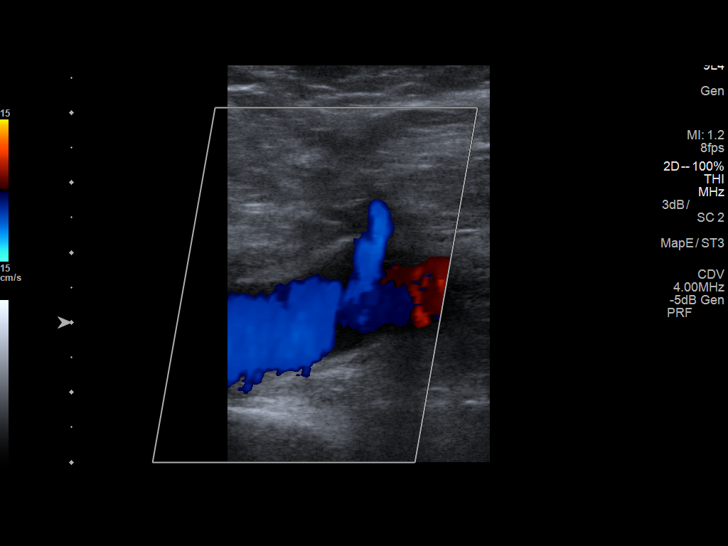
[im 6/32]
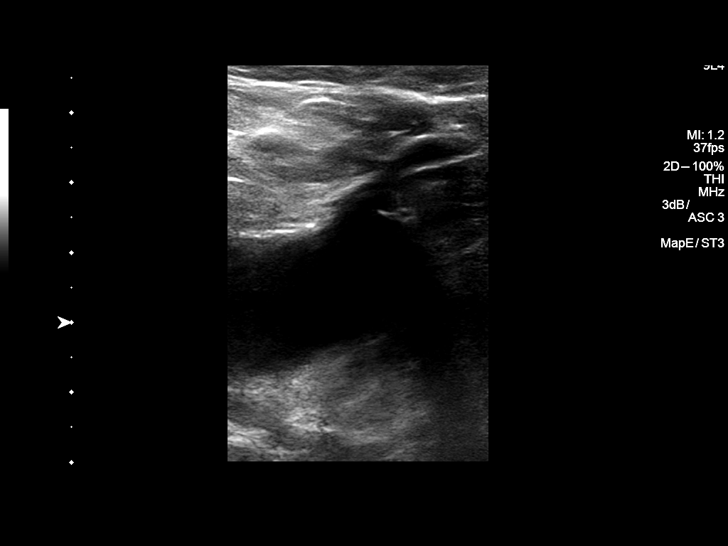
[im 9/32]
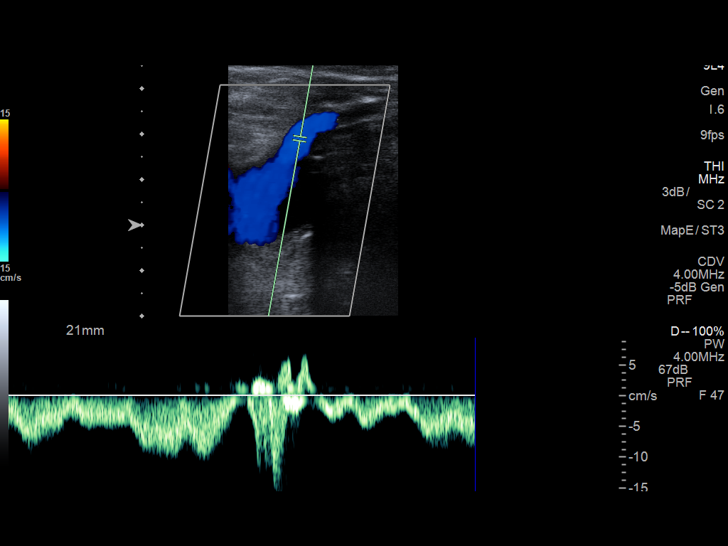
[im 11/32]
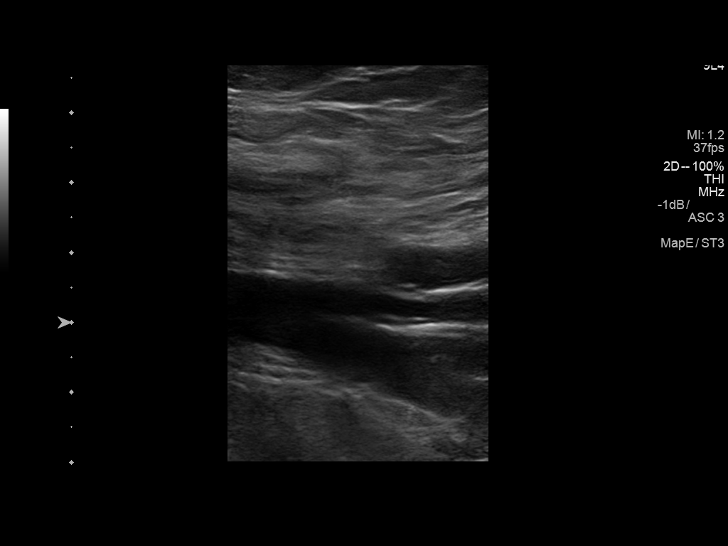
[im 14/32]
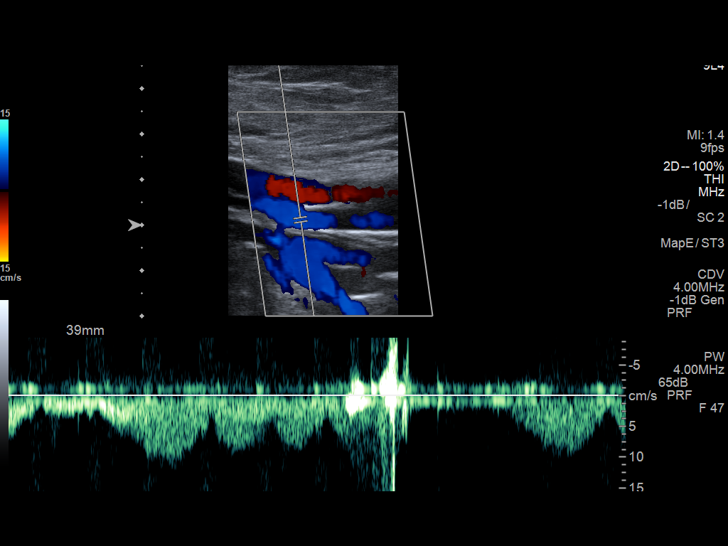
[im 17/32]
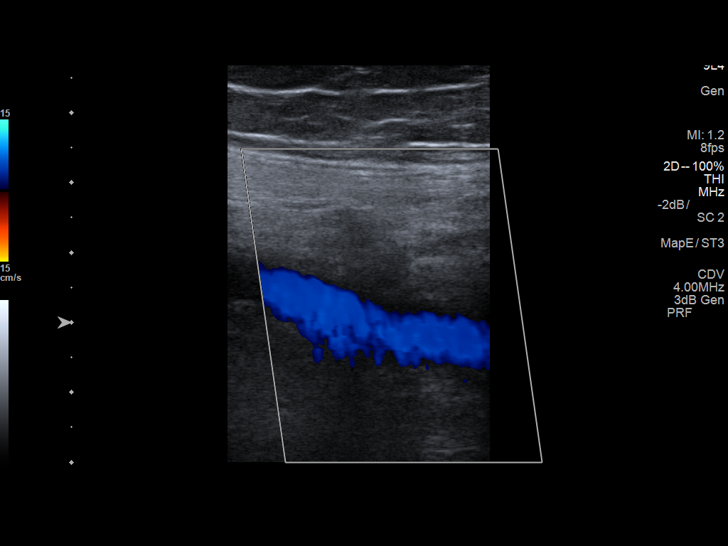
[im 18/32]
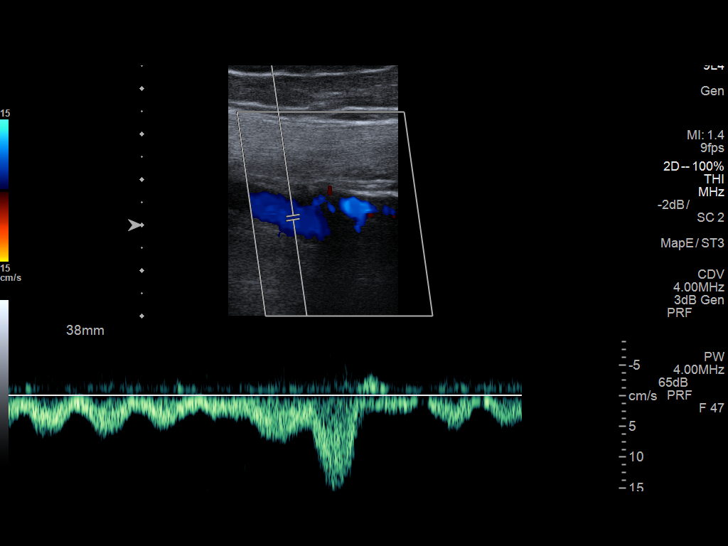
[im 21/32]
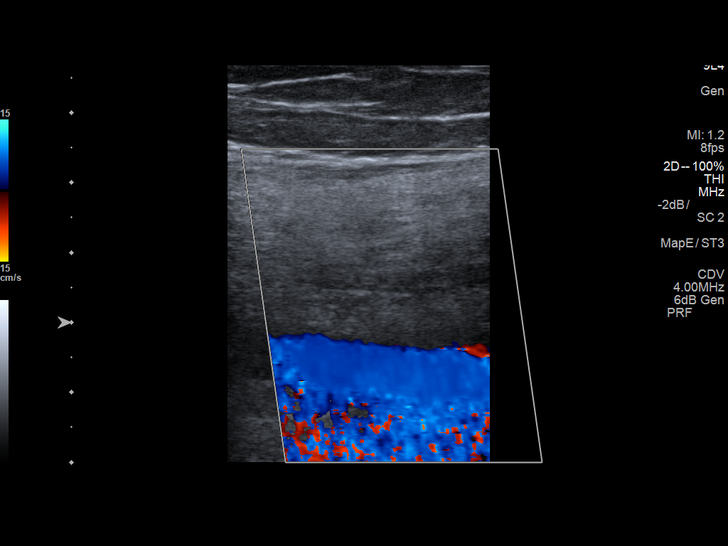
[im 23/32]
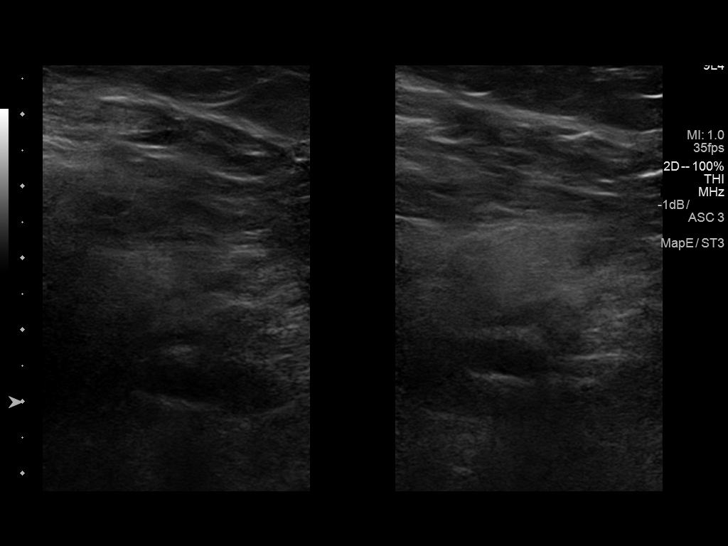
[im 26/32]
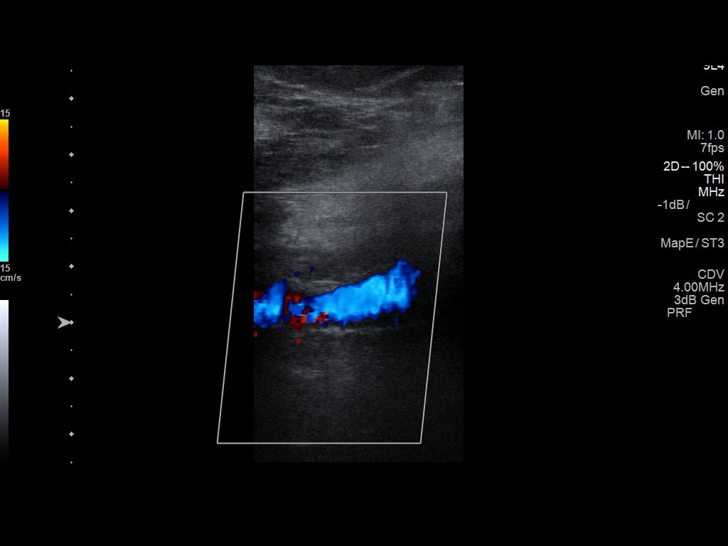
[im 29/32]
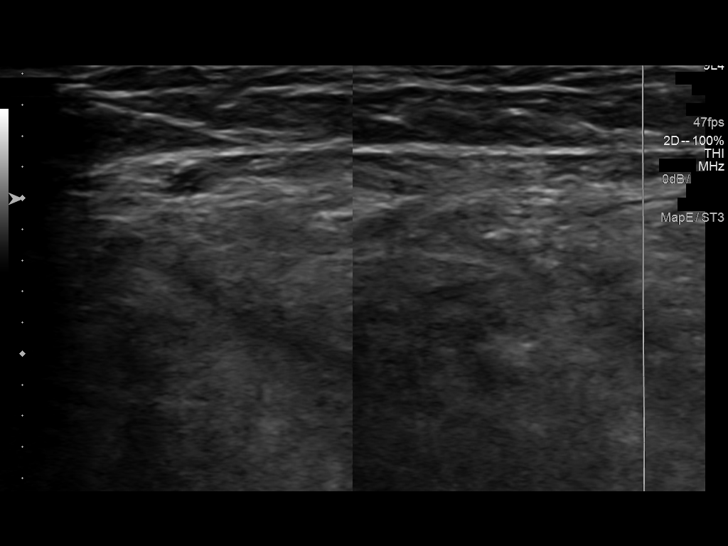
[im 32/32]
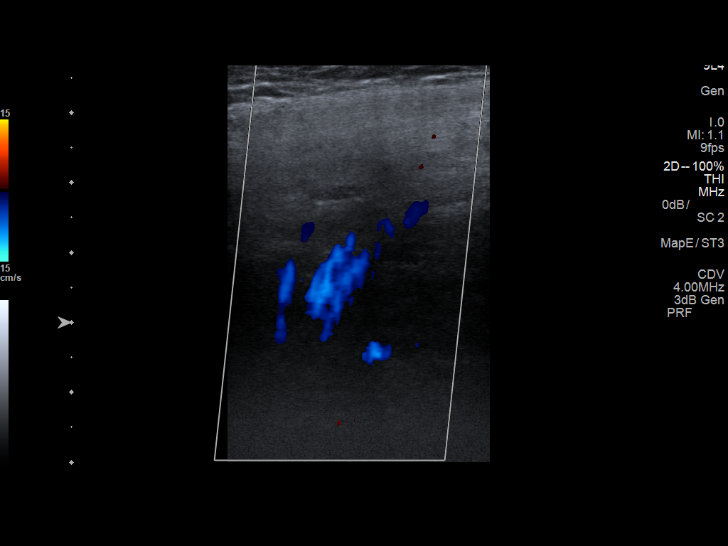

[13 of 24 positions shown; findings below may reference images not displayed]

FINDINGS: Contralateral Common Femoral Vein: Respiratory phasicity is normal
and symmetric with the symptomatic side. No evidence of thrombus.
Normal compressibility.

Common Femoral Vein: No evidence of thrombus. Normal
compressibility, respiratory phasicity and response to augmentation.

Saphenofemoral Junction: No evidence of thrombus. Normal
compressibility and flow on color Doppler imaging.

Profunda Femoral Vein: No evidence of thrombus. Normal
compressibility and flow on color Doppler imaging.

Femoral Vein: No evidence of thrombus. Normal compressibility,
respiratory phasicity and response to augmentation.

Popliteal Vein: No evidence of thrombus. Normal compressibility,
respiratory phasicity and response to augmentation.

Calf Veins: No evidence of thrombus. Normal compressibility and flow
on color Doppler imaging.

Superficial Great Saphenous Vein: No evidence of thrombus. Normal
compressibility and flow on color Doppler imaging.

Venous Reflux:  None.

Other Findings:  None.
IMPRESSION: No evidence of DVT within the right lower extremity.

## 2018-07-27 ENCOUNTER — Encounter: Payer: Self-pay | Admitting: Internal Medicine

## 2018-07-27 ENCOUNTER — Non-Acute Institutional Stay (SKILLED_NURSING_FACILITY): Payer: Medicare Other | Admitting: Internal Medicine

## 2018-07-27 DIAGNOSIS — Z794 Long term (current) use of insulin: Secondary | ICD-10-CM

## 2018-07-27 DIAGNOSIS — E1121 Type 2 diabetes mellitus with diabetic nephropathy: Secondary | ICD-10-CM | POA: Diagnosis not present

## 2018-07-27 NOTE — Progress Notes (Signed)
Location:    [facility] Room Number: 112/D Place of Service:  SNF (31) Provider:  Veleta Miners MD  [name], MD  Patient Care Team: [name], MD as PCP - General (Internal Medicine) Cassandria Anger, MD as Consulting Physician (Endocrinology) Fran Lowes, MD as Consulting Physician (Nephrology) Gala Romney Cristopher Estimable, MD as Consulting Physician (Gastroenterology)  Extended Emergency Contact Information Primary Emergency Contact: Betsey Amen Address: DSS  [place] of [place] Phone: [phone] Work Phone: [phone] Relation: Legal Guardian Secondary Emergency Contact: Ellerbrock,Richard Address: [address]          [place], [place] [address] [place] Phone: [phone] Relation: Brother  Code Status:  Full Code Goals of care: Advanced Directive information Advanced Directives 07/27/2018  Does Patient Have a Medical Advance Directive? Yes  Type of Advance Directive (No Data)  Does patient want to make changes to medical advance directive? No - Patient declined  Copy of [place] in Chart? No - copy requested  Would patient like information on creating a medical advance directive? No - Patient declined  Pre-existing out of facility DNR order (yellow form or pink MOST form) -     Chief Complaint  Patient presents with  . Acute Visit    Patient is being seen for f/u BS    HPI:  Pt is a 72 y.o. male seen today for an acute visit for Uncontrolled Diabetes Mellitus Patient is Long term resident of facility. 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 I had increased his Lantus. This was follow up. His A1C is also  Up  from 8 to 8.5 now. AS per my previous note patient is very non compliant with his diet. He gets outside food and soda pop from vending Machine. Patient did not have any new Complains.   Past Medical History:  Diagnosis Date  . Abnormal CT scan, kidney 10/06/2011  . Acute pyelonephritis 10/07/2011  . Anemia    normocytic  . Anxiety    mental retardation  . Bladder wall thickening 10/06/2011  . BPH (benign prostatic hypertrophy)   . Diabetes mellitus   . Dialysis patient [facility])    Tuesday, Thursday and Saturday,   . Edema     history of lower extremity edema  . GERD (gastroesophageal reflux disease)   . Heme positive stool   . Hydronephrosis   . Hyperkalemia   . Hyperlipidemia   . Hypernatremia   . Hypertension   . Hypothyroidism   . Impaired speech   . 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 MR (mental retardation)   . Muscle weakness   . Obstructive uropathy   . 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 Poor historian poor historian  . Protein calorie malnutrition ([place])   . Pyelonephritis   . Renal failure (ARF), acute on chronic (HCC)   . Renal insufficiency    chronic history  . Sepsis ([place])   . Smoking   . Uremia   . Urinary retention   . UTI (lower urinary tract infection) 10/06/2011   Past Surgical History:  Procedure Laterality Date  . AV FISTULA PLACEMENT Left 07/06/2015   Procedure:  INSERTION LEFT ARM ARTERIOVENOUS GORTEX GRAFT;  Surgeon: Angelia Mould, MD;  Location: [place];  Service: Vascular;  Laterality: Left;  . AV FISTULA PLACEMENT Right 02/26/2016   Procedure: ARTERIOVENOUS (AV) FISTULA CREATION ;  Surgeon: Angelia Mould, MD;  Location: [place];  Service: Vascular;  Laterality: Right;  . [place] REMOVAL Left 10/09/2015   Procedure: REMOVAL OF ARTERIOVENOUS GORETEX GRAFT ([place]) Evacuation of Lymphocele, Vein Patch angioplasty of brachial artery.;  Surgeon: Angelia Mould, MD;  Location: [place];  Service: Vascular;  Laterality: Left;  .  BASCILIC VEIN TRANSPOSITION Right 02/26/2016   Procedure: Right BASCILIC VEIN TRANSPOSITION;  Surgeon: Angelia Mould, MD;  Location: [place];  Service: Vascular;  Laterality: Right;  . CIRCUMCISION N/A 01/04/2014   Procedure: CIRCUMCISION ADULT (procedure #1);  Surgeon: Marissa Nestle, MD;  Location: [facility];  Service: Urology;  Laterality: N/A;  . COLECTOMY N/A 05/04/2017   Procedure: TOTAL COLECTOMY;  Surgeon: Aviva Signs, MD;  [facility];  Service: General;  Laterality: N/A;  . COLONOSCOPY N/A 04/27/2017   Procedure: COLONOSCOPY;  Surgeon: Daneil Dolin, MD;  [facility];  Service: Endoscopy;  Laterality: N/A;  245  . CYSTOSCOPY W/ RETROGRADES Bilateral 06/29/2015   Procedure: CYSTOSCOPY, DILATION OF URETHRAL STRICTURE WITH BILATERAL RETROGRADE PYELOGRAM,SUPRAPUBIC TUBE CHANGE;  Surgeon: Festus Aloe, MD;  Location: [facility];  Service: Urology;  Laterality: Bilateral;  . CYSTOSCOPY WITH URETHRAL DILATATION N/A 12/29/2013   Procedure: CYSTOSCOPY WITH URETHRAL DILATATION;  Surgeon: Marissa Nestle, MD;  [facility];  Service: Urology;  Laterality: N/A;  . ESOPHAGOGASTRODUODENOSCOPY N/A 04/27/2017   Procedure: ESOPHAGOGASTRODUODENOSCOPY (EGD);  Surgeon: Daneil Dolin, MD;  [facility];  Service: Endoscopy;  Laterality: N/A;  . IR THROMBECTOMY AV FISTULA W/THROMBOLYSIS/PTA INC/SHUNT/IMG RIGHT Right 04/26/2018  . IR US GUIDE VASC ACCESS RIGHT  04/26/2018  . ORIF FEMUR FRACTURE Right 11/22/2016   Procedure: OPEN REDUCTION INTERNAL FIXATION (ORIF) DISTAL FEMUR FRACTURE;  Surgeon: Rod Can, MD;  Location: [facility];  Service: Orthopedics;  Laterality: Right;  . PATCH ANGIOPLASTY Right 12/10/2017   Procedure: PATCH ANGIOPLASTY;  Surgeon: Angelia Mould, MD;  Location: [facility];  Service: Vascular;  Laterality: Right;  . PERIPHERAL VASCULAR CATHETERIZATION N/A 10/08/2015   Procedure: A/V Shuntogram;  Surgeon: Angelia Mould, MD;  Location: [facility] CV  LAB;  Service: Cardiovascular;  Laterality: N/A;  . THROMBECTOMY W/ EMBOLECTOMY Right 12/10/2017   Procedure: THROMBECTOMY REVISION RIGHT ARM  ARTERIOVENOUS FISTULA;  Surgeon: Angelia Mould, MD;  Location: [place];  Service: Vascular;  Laterality: Right;  . TRANSURETHRAL RESECTION OF PROSTATE N/A 01/04/2014   Procedure: TRANSURETHRAL RESECTION OF THE PROSTATE (TURP) (procedure #2);  Surgeon: Marissa Nestle, MD;  [facility];  Service: Urology;  Laterality: N/A;    No Known Allergies  Outpatient Encounter Medications as of 07/27/2018  Medication Sig  . atorvastatin (LIPITOR) 40 MG tablet Take 80 mg by mouth every evening.   . Insulin Glargine (BASAGLAR KWIKPEN) 100 UNIT/ML SOPN Inject 14 Units into the skin at bedtime.   [place] Kitchen levothyroxine (SYNTHROID, LEVOTHROID) 88 MCG tablet Take 88 mcg by mouth daily before breakfast.  . linagliptin (TRADJENTA) 5 MG TABS tablet Take 5 mg by mouth every evening.   . midodrine (PROAMATINE) 10 MG tablet Take 0.5 tablets (5 mg total) by mouth every dialysis (hypotension).  . Multiple Vitamin (MULTIVITAMIN WITH MINERALS) TABS tablet Take 1 tablet by mouth every evening.   . Nutritional Supplements (NEPRO/CARBSTEADY PO) Take 1 Can by mouth 2 (two) times daily.  [place] Kitchen omeprazole (PRILOSEC) 40 MG capsule  Take 40 mg by mouth at bedtime.   . polyethylene glycol (MIRALAX / GLYCOLAX) packet Take 17 g by mouth as needed for mild constipation.   . sevelamer carbonate (RENVELA) 800 MG tablet Take 1,600-3,200 mg by mouth See admin instructions. Take 4 tablets (3200mg ) by mouth 3 times daily with meals, and 2 tablets (1600mg ) by mouth with bedtime snack  . tamsulosin (FLOMAX) 0.4 MG CAPS capsule Take 0.4 mg by mouth every evening. Give 30 minutes after a meal  . torsemide (DEMADEX) 10 MG tablet Take 5 tablets (50 mg total) by mouth daily.   No facility-administered encounter medications on file as of 07/27/2018.      Review of Systems  Review of Systems    Constitutional: Negative for activity change, appetite change, chills, diaphoresis, fatigue and fever.  HENT: Negative for mouth sores, postnasal drip, rhinorrhea, sinus pain and sore throat.   Respiratory: Negative for apnea, cough, chest tightness, shortness of breath and wheezing.   Cardiovascular: Negative for chest pain, palpitations and leg swelling.  Gastrointestinal: Negative for abdominal distention, abdominal pain, constipation, diarrhea, nausea and vomiting.  Genitourinary: Negative for dysuria and frequency.  Musculoskeletal: Negative for arthralgias, joint swelling and myalgias.  Skin: Negative for rash.  Neurological: Negative for dizziness, syncope, weakness, light-headedness and numbness.  Psychiatric/Behavioral: Negative for behavioral problems, confusion and sleep disturbance.     Immunization History  Administered Date(s) Administered  . Influenza,inj,Quad PF,6+ Mos 12/26/2013, 07/09/2015  . Influenza-Unspecified 07/31/2017  . PPD Test 03/07/2015  . Pneumococcal Conjugate-13 07/31/2017  . Pneumococcal Polysaccharide-23 03/04/2015  . Tdap 08/22/2017   Pertinent  Health Maintenance Due  Topic Date Due  . INFLUENZA VACCINE  08/21/2018 (Originally 05/27/2018)  . OPHTHALMOLOGY EXAM  08/21/2018 (Originally 08/23/1956)  . URINE MICROALBUMIN  08/21/2018 (Originally 08/23/1956)  . HEMOGLOBIN A1C  01/22/2019  . FOOT EXAM  05/20/2019  . COLONOSCOPY  04/28/2027  . PNA vac Low Risk Adult  Completed   Fall Risk  02/18/2018  Falls in the past year? No   Functional Status Survey:    Vitals:   07/27/18 0941  BP: 99/65  Pulse: 85  Resp: 18  Temp: 97.6 F (36.4 C)  TempSrc: Oral  SpO2: 98%   There is no height or weight on file to calculate BMI. Physical Exam  Constitutional: He appears well-developed and well-nourished.  HENT:  Head: Normocephalic.  Mouth/Throat: Oropharynx is clear and moist.  Eyes: Pupils are equal, round, and reactive to light.  Neck: Neck  supple.  Cardiovascular: Normal rate and normal heart sounds.  Pulmonary/Chest: Effort normal and breath sounds normal. No respiratory distress. He has no wheezes.  Abdominal: Soft. Bowel sounds are normal. He exhibits no distension. There is no tenderness. There is no rebound.  Musculoskeletal: He exhibits no edema.  Neurological: He is alert.  Skin: Skin is warm and dry.  Psychiatric: He has a normal mood and affect. His behavior is normal. Thought content normal.    Labs reviewed: Recent Labs    01/22/18 0945 04/22/18 0718 04/26/18 1236 07/24/18 0737  NA 137 137 137 136  K 3.4* 4.3 4.8 4.7  CL 94* 101 102 99  CO2 21* 25 21* 26  GLUCOSE 143* 271* 165* 215*  BUN 61* 51* 62* 29*  CREATININE 10.63* 9.14* 11.66* 8.42*  CALCIUM 8.2* 8.9 8.6* 8.9  PHOS 7.9*  --   --   --    Recent Labs    01/19/18 1608 01/22/18 0945  AST 22  --  ALT 18  --   ALKPHOS 152*  --   BILITOT 0.8  --   PROT 6.8  --   ALBUMIN 3.3* 3.2*   Recent Labs    01/19/18 0700 01/19/18 1608  04/22/18 0718 04/26/18 1236 07/24/18 0737  WBC 8.9 7.4   < > 6.6 8.7 5.4  NEUTROABS 6.8 5.3  --  3.5  --   --   HGB 11.6* 11.0*   < > 11.7* 11.0* 11.4*  HCT 36.3* 34.8*   < > 36.0* 33.7* 34.6*  MCV 106.1* 106.4*   < > 105.6* 105.0* 107.1*  PLT 145* 143*   < > 142* 125* 128*   < > = values in this interval not displayed.   Lab Results  Component Value Date   TSH 3.556 05/25/2018   Lab Results  Component Value Date   HGBA1C 8.5 (H) 07/24/2018   Lab Results  Component Value Date   CHOL 80 07/03/2018   HDL 19 (L) 07/03/2018   LDLCALC UNABLE TO CALCULATE IF TRIGLYCERIDE OVER 400 mg/dL 07/03/2018   TRIG 481 (H) 07/03/2018   CHOLHDL 4.2 07/03/2018    Significant Diagnostic Results in last 30 days:  No results found.  Assessment/Plan  Uncontrolled Diabetes Mellitus with Hypertriglycerdemia Will Increase his Lantus to 18 units Also Start him on Novolog Bolus BID Continue Diet Modification Patient is  agreeing right now to change his Diet. Also on Januvia Continue Accu Checks  Follow up in 4 weeks   Family/ staff Communication:   Labs/tests ordered:   Total time spent in this patient care encounter was 25_ minutes; greater than 50% of the visit spent counseling patient, reviewing records , Labs and coordinating care for problems addressed at this encounter.

## 2018-07-28 DIAGNOSIS — D631 Anemia in chronic kidney disease: Secondary | ICD-10-CM | POA: Diagnosis not present

## 2018-07-28 DIAGNOSIS — Z992 Dependence on renal dialysis: Secondary | ICD-10-CM | POA: Diagnosis not present

## 2018-07-28 DIAGNOSIS — N2581 Secondary hyperparathyroidism of renal origin: Secondary | ICD-10-CM | POA: Diagnosis not present

## 2018-07-28 DIAGNOSIS — D509 Iron deficiency anemia, unspecified: Secondary | ICD-10-CM | POA: Diagnosis not present

## 2018-07-28 DIAGNOSIS — N186 End stage renal disease: Secondary | ICD-10-CM | POA: Diagnosis not present

## 2018-07-31 DIAGNOSIS — Z992 Dependence on renal dialysis: Secondary | ICD-10-CM | POA: Diagnosis not present

## 2018-07-31 DIAGNOSIS — N186 End stage renal disease: Secondary | ICD-10-CM | POA: Diagnosis not present

## 2018-07-31 DIAGNOSIS — D509 Iron deficiency anemia, unspecified: Secondary | ICD-10-CM | POA: Diagnosis not present

## 2018-07-31 DIAGNOSIS — D631 Anemia in chronic kidney disease: Secondary | ICD-10-CM | POA: Diagnosis not present

## 2018-07-31 DIAGNOSIS — N2581 Secondary hyperparathyroidism of renal origin: Secondary | ICD-10-CM | POA: Diagnosis not present

## 2018-08-03 DIAGNOSIS — D509 Iron deficiency anemia, unspecified: Secondary | ICD-10-CM | POA: Diagnosis not present

## 2018-08-03 DIAGNOSIS — Z992 Dependence on renal dialysis: Secondary | ICD-10-CM | POA: Diagnosis not present

## 2018-08-03 DIAGNOSIS — N186 End stage renal disease: Secondary | ICD-10-CM | POA: Diagnosis not present

## 2018-08-03 DIAGNOSIS — N2581 Secondary hyperparathyroidism of renal origin: Secondary | ICD-10-CM | POA: Diagnosis not present

## 2018-08-03 DIAGNOSIS — D631 Anemia in chronic kidney disease: Secondary | ICD-10-CM | POA: Diagnosis not present

## 2018-08-05 DIAGNOSIS — D509 Iron deficiency anemia, unspecified: Secondary | ICD-10-CM | POA: Diagnosis not present

## 2018-08-05 DIAGNOSIS — N186 End stage renal disease: Secondary | ICD-10-CM | POA: Diagnosis not present

## 2018-08-05 DIAGNOSIS — D631 Anemia in chronic kidney disease: Secondary | ICD-10-CM | POA: Diagnosis not present

## 2018-08-05 DIAGNOSIS — N2581 Secondary hyperparathyroidism of renal origin: Secondary | ICD-10-CM | POA: Diagnosis not present

## 2018-08-05 DIAGNOSIS — Z992 Dependence on renal dialysis: Secondary | ICD-10-CM | POA: Diagnosis not present

## 2018-08-07 DIAGNOSIS — Z992 Dependence on renal dialysis: Secondary | ICD-10-CM | POA: Diagnosis not present

## 2018-08-07 DIAGNOSIS — N2581 Secondary hyperparathyroidism of renal origin: Secondary | ICD-10-CM | POA: Diagnosis not present

## 2018-08-07 DIAGNOSIS — D509 Iron deficiency anemia, unspecified: Secondary | ICD-10-CM | POA: Diagnosis not present

## 2018-08-07 DIAGNOSIS — N186 End stage renal disease: Secondary | ICD-10-CM | POA: Diagnosis not present

## 2018-08-07 DIAGNOSIS — D631 Anemia in chronic kidney disease: Secondary | ICD-10-CM | POA: Diagnosis not present

## 2018-08-10 DIAGNOSIS — E119 Type 2 diabetes mellitus without complications: Secondary | ICD-10-CM | POA: Diagnosis not present

## 2018-08-10 DIAGNOSIS — N2581 Secondary hyperparathyroidism of renal origin: Secondary | ICD-10-CM | POA: Diagnosis not present

## 2018-08-10 DIAGNOSIS — D509 Iron deficiency anemia, unspecified: Secondary | ICD-10-CM | POA: Diagnosis not present

## 2018-08-10 DIAGNOSIS — D631 Anemia in chronic kidney disease: Secondary | ICD-10-CM | POA: Diagnosis not present

## 2018-08-10 DIAGNOSIS — Z992 Dependence on renal dialysis: Secondary | ICD-10-CM | POA: Diagnosis not present

## 2018-08-10 DIAGNOSIS — N186 End stage renal disease: Secondary | ICD-10-CM | POA: Diagnosis not present

## 2018-08-11 ENCOUNTER — Encounter: Payer: Self-pay | Admitting: *Deleted

## 2018-08-11 ENCOUNTER — Other Ambulatory Visit: Payer: Self-pay | Admitting: *Deleted

## 2018-08-11 DIAGNOSIS — E1140 Type 2 diabetes mellitus with diabetic neuropathy, unspecified: Secondary | ICD-10-CM | POA: Diagnosis not present

## 2018-08-11 DIAGNOSIS — E1151 Type 2 diabetes mellitus with diabetic peripheral angiopathy without gangrene: Secondary | ICD-10-CM | POA: Diagnosis not present

## 2018-08-11 DIAGNOSIS — B351 Tinea unguium: Secondary | ICD-10-CM | POA: Diagnosis not present

## 2018-08-11 DIAGNOSIS — Z794 Long term (current) use of insulin: Secondary | ICD-10-CM | POA: Diagnosis not present

## 2018-08-11 DIAGNOSIS — L603 Nail dystrophy: Secondary | ICD-10-CM | POA: Diagnosis not present

## 2018-08-13 ENCOUNTER — Telehealth: Payer: Self-pay | Admitting: *Deleted

## 2018-08-13 ENCOUNTER — Ambulatory Visit (HOSPITAL_COMMUNITY)
Admission: RE | Admit: 2018-08-13 | Discharge: 2018-08-13 | Disposition: A | Discharge status: Home / Self Care | Payer: Medicare Other | Source: Ambulatory Visit | Attending: Vascular Surgery | Admitting: Vascular Surgery

## 2018-08-13 ENCOUNTER — Encounter (HOSPITAL_COMMUNITY)
Admission: RE | Disposition: A | Discharge status: Home / Self Care | Payer: Self-pay | Source: Ambulatory Visit | Attending: Vascular Surgery

## 2018-08-13 DIAGNOSIS — E785 Hyperlipidemia, unspecified: Secondary | ICD-10-CM | POA: Diagnosis not present

## 2018-08-13 DIAGNOSIS — T82858A Stenosis of vascular prosthetic devices, implants and grafts, initial encounter: Secondary | ICD-10-CM | POA: Insufficient documentation

## 2018-08-13 DIAGNOSIS — R338 Other retention of urine: Secondary | ICD-10-CM | POA: Insufficient documentation

## 2018-08-13 DIAGNOSIS — E1122 Type 2 diabetes mellitus with diabetic chronic kidney disease: Secondary | ICD-10-CM | POA: Diagnosis not present

## 2018-08-13 DIAGNOSIS — T82898A Other specified complication of vascular prosthetic devices, implants and grafts, initial encounter: Secondary | ICD-10-CM | POA: Diagnosis not present

## 2018-08-13 DIAGNOSIS — N186 End stage renal disease: Secondary | ICD-10-CM | POA: Insufficient documentation

## 2018-08-13 DIAGNOSIS — F419 Anxiety disorder, unspecified: Secondary | ICD-10-CM | POA: Insufficient documentation

## 2018-08-13 DIAGNOSIS — N401 Enlarged prostate with lower urinary tract symptoms: Secondary | ICD-10-CM | POA: Diagnosis not present

## 2018-08-13 DIAGNOSIS — E039 Hypothyroidism, unspecified: Secondary | ICD-10-CM | POA: Insufficient documentation

## 2018-08-13 DIAGNOSIS — K219 Gastro-esophageal reflux disease without esophagitis: Secondary | ICD-10-CM | POA: Insufficient documentation

## 2018-08-13 DIAGNOSIS — I120 Hypertensive chronic kidney disease with stage 5 chronic kidney disease or end stage renal disease: Secondary | ICD-10-CM | POA: Diagnosis not present

## 2018-08-13 DIAGNOSIS — F79 Unspecified intellectual disabilities: Secondary | ICD-10-CM | POA: Insufficient documentation

## 2018-08-13 DIAGNOSIS — Z992 Dependence on renal dialysis: Secondary | ICD-10-CM | POA: Diagnosis not present

## 2018-08-13 DIAGNOSIS — Y832 Surgical operation with anastomosis, bypass or graft as the cause of abnormal reaction of the patient, or of later complication, without mention of misadventure at the time of the procedure: Secondary | ICD-10-CM | POA: Diagnosis not present

## 2018-08-13 HISTORY — PX: A/V FISTULAGRAM: CATH118298

## 2018-08-13 HISTORY — PX: PERIPHERAL VASCULAR BALLOON ANGIOPLASTY: CATH118281

## 2018-08-13 LAB — POCT I-STAT, CHEM 8
BUN: 57 mg/dL — ABNORMAL HIGH (ref 8–23)
Calcium, Ion: 1.14 mmol/L — ABNORMAL LOW (ref 1.15–1.40)
Chloride: 103 mmol/L (ref 98–111)
Creatinine, Ser: 12.1 mg/dL — ABNORMAL HIGH (ref 0.61–1.24)
Glucose, Bld: 176 mg/dL — ABNORMAL HIGH (ref 70–99)
HCT: 38 % — ABNORMAL LOW (ref 39.0–52.0)
Hemoglobin: 12.9 g/dL — ABNORMAL LOW (ref 13.0–17.0)
Potassium: 5.2 mmol/L — ABNORMAL HIGH (ref 3.5–5.1)
Sodium: 139 mmol/L (ref 135–145)
TCO2: 27 mmol/L (ref 22–32)

## 2018-08-13 LAB — GLUCOSE, CAPILLARY
Glucose-Capillary: 176 mg/dL — ABNORMAL HIGH (ref 70–99)
Glucose-Capillary: 124 mg/dL — ABNORMAL HIGH (ref 70–99)

## 2018-08-13 SURGERY — A/V FISTULAGRAM
Anesthesia: LOCAL

## 2018-08-13 MED ORDER — MORPHINE SULFATE (PF) 10 MG/ML IV SOLN: 2.0000 mg | INTRAVENOUS | Status: DC | PRN

## 2018-08-13 MED ORDER — SODIUM CHLORIDE 0.9 % IV SOLN: 250.0000 mL | INTRAVENOUS | Status: DC | PRN

## 2018-08-13 MED ORDER — SODIUM CHLORIDE 0.9% FLUSH: 3.0000 mL | INTRAVENOUS | Status: DC | PRN

## 2018-08-13 MED ORDER — ONDANSETRON HCL 4 MG/2ML IJ SOLN: 4.0000 mg | Freq: Four times a day (QID) | INTRAMUSCULAR | Status: DC | PRN

## 2018-08-13 MED ORDER — LIDOCAINE HCL (PF) 1 % IJ SOLN
INTRAMUSCULAR | Status: DC | PRN
  Administered 2018-08-13: 5 mL

## 2018-08-13 MED ORDER — SODIUM CHLORIDE 0.9% FLUSH: 3.0000 mL | Freq: Two times a day (BID) | INTRAVENOUS | Status: DC

## 2018-08-13 MED ORDER — HYDRALAZINE HCL 20 MG/ML IJ SOLN: 5.0000 mg | INTRAMUSCULAR | Status: DC | PRN

## 2018-08-13 MED ORDER — ACETAMINOPHEN 325 MG PO TABS: 650.0000 mg | ORAL_TABLET | ORAL | Status: DC | PRN

## 2018-08-13 MED ORDER — HEPARIN SODIUM (PORCINE) 1000 UNIT/ML IJ SOLN
INTRAMUSCULAR | Status: AC
  Filled 2018-08-13: qty 1

## 2018-08-13 MED ORDER — LABETALOL HCL 5 MG/ML IV SOLN: 10.0000 mg | INTRAVENOUS | Status: DC | PRN

## 2018-08-13 MED ORDER — LIDOCAINE HCL (PF) 1 % IJ SOLN
INTRAMUSCULAR | Status: AC
  Filled 2018-08-13: qty 30

## 2018-08-13 MED ORDER — HEPARIN (PORCINE) IN NACL 1000-0.9 UT/500ML-% IV SOLN
INTRAVENOUS | Status: DC | PRN
  Administered 2018-08-13: 500 mL

## 2018-08-13 MED ORDER — OXYCODONE HCL 5 MG PO TABS: 5.0000 mg | ORAL_TABLET | ORAL | Status: DC | PRN

## 2018-08-13 MED ORDER — HEPARIN (PORCINE) IN NACL 1000-0.9 UT/500ML-% IV SOLN
INTRAVENOUS | Status: AC
  Filled 2018-08-13: qty 500

## 2018-08-13 MED ORDER — HEPARIN SODIUM (PORCINE) 1000 UNIT/ML IJ SOLN
INTRAMUSCULAR | Status: DC | PRN
  Administered 2018-08-13: 3000 [IU] via INTRAVENOUS

## 2018-08-13 MED ORDER — IODIXANOL 320 MG/ML IV SOLN
INTRAVENOUS | Status: DC | PRN
  Administered 2018-08-13: 35 mL via INTRAVENOUS

## 2018-08-13 SURGICAL SUPPLY — 25 items
BAG SNAP BAND KOVER 36X36 (MISCELLANEOUS) ×3 IMPLANT
BALLN MUSTANG 5X60X75 (BALLOONS) ×3
BALLN MUSTANG 7.0X20 75 (BALLOONS) ×3
BALLN MUSTANG 7X60X75 (BALLOONS) ×3
BALLN MUSTANG 8X60X75 (BALLOONS) ×3
BALLOON MUSTANG 5X60X75 (BALLOONS) ×2 IMPLANT
BALLOON MUSTANG 7.0X20 75 (BALLOONS) ×2 IMPLANT
BALLOON MUSTANG 7X60X75 (BALLOONS) ×2 IMPLANT
BALLOON MUSTANG 8X60X75 (BALLOONS) ×2 IMPLANT
CATH STRAIGHT 5FR 65CM (CATHETERS) ×3 IMPLANT
COVER DOME SNAP 22 D (MISCELLANEOUS) ×3 IMPLANT
COVER PRB 48X5XTLSCP FOLD TPE (BAG) ×2 IMPLANT
COVER PROBE 5X48 (BAG) ×3
DEVICE TORQUE .025-.038 (MISCELLANEOUS) ×3 IMPLANT
GUIDEWIRE ANGLED .035X150CM (WIRE) ×3 IMPLANT
KIT ENCORE 26 ADVANTAGE (KITS) ×3 IMPLANT
KIT MICROPUNCTURE NIT STIFF (SHEATH) ×3 IMPLANT
PROTECTION STATION PRESSURIZED (MISCELLANEOUS) ×3
SHEATH PINNACLE R/O II 7F 4CM (SHEATH) ×3 IMPLANT
STATION PROTECTION PRESSURIZED (MISCELLANEOUS) ×2 IMPLANT
STOPCOCK MORSE 400PSI 3WAY (MISCELLANEOUS) ×3 IMPLANT
TRAY PV CATH (CUSTOM PROCEDURE TRAY) ×3 IMPLANT
TUBING CIL FLEX 10 FLL-RA (TUBING) ×3 IMPLANT
WIRE BENTSON .035X145CM (WIRE) ×3 IMPLANT
WIRE TORQFLEX AUST .018X40CM (WIRE) ×3 IMPLANT

## 2018-08-13 NOTE — Telephone Encounter (Signed)
-----   Message from Elam Dutch, MD sent at 08/13/2018  3:32 PM EDT ----- Procedure: Right arm fistulogram, angioplasty right arm AV fistula   Patient's right arm AV fistula is ready for use.  If he develops poorly functioning over time he could be candidate once again for angioplasty of this area.  Ruta Hinds, MD [facility] Office: [phone] Pager: [phone]

## 2018-08-13 NOTE — Telephone Encounter (Signed)
Information for future surgery

## 2018-08-13 NOTE — H&P (Signed)
[facility]ORT ST[facility] H&P  CC: poorly functioning hemodialysis access   HPI: Pt states his right arm access works sometimes but not others.  Last thrombectomy was July of this year  Past Medical History:  Diagnosis Date  . Abnormal CT scan, kidney 10/06/2011  . Acute pyelonephritis 10/07/2011  . Anemia    normocytic  . Anxiety    mental retardation  . Bladder wall thickening 10/06/2011  . BPH (benign prostatic hypertrophy)   . Diabetes mellitus   . Dialysis patient [facility]    Tuesday, Thursday and Saturday,   . Edema     history of lower extremity edema  . GERD (gastroesophageal reflux disease)   . Heme positive stool   . Hydronephrosis   . Hyperkalemia   . Hyperlipidemia   . Hypernatremia   . Hypertension   . Hypothyroidism   . Impaired speech   . 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 MR (mental retardation)   . Muscle weakness   . Obstructive uropathy   . 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 Poor historian poor historian  . Protein calorie malnutrition ([place])   . Pyelonephritis   . Renal failure (ARF), acute on chronic (HCC)   . Renal insufficiency    chronic history  . Sepsis ([place])   . Smoking   . Uremia   . Urinary retention   . UTI (lower urinary tract infection) 10/06/2011    FH:  Non-Contributory  Social HX Social History   Tobacco Use  . Smoking status: Never Smoker  . Smokeless tobacco: Never Used  Substance Use Topics  . Alcohol use: No  . Drug use: No    Allergies No Known Allergies  Medications Current Facility-Administered Medications  Medication Dose Route Frequency Provider Last Rate Last Dose  . 0.9 %  sodium chloride infusion  250 mL Intravenous PRN Elam Dutch, MD      . sodium chloride flush (NS) 0.9 % injection 3 mL  3 mL Intravenous Q12H Fields, Charles E, MD      . sodium chloride flush (NS) 0.9 % injection 3 mL  3 mL Intravenous PRN Elam Dutch, MD        PHYSICAL EXAM  Vitals:   08/13/18 0830  BP: 116/74  Pulse: (!) 108  Temp: 98.8 F (37.1 C)  SpO2: 100%    General:  WDWN in NAD HENT: WNL Eyes: Pupils equal Pulmonary: normal non-labored breathing , without Rales, rhonchi,  wheezing Cardiac: RRR, Vascular Exam/Pulses:  Slightly pulsatile right arm AV graft upper arm 2+ radial pulse  Extremities without ischemic changes, no Gangrene , no cellulitis; no open wounds;  Neuro A&O x 3; good sensation; motion in all extremities  Impression: Poorly functioning right arm hemodialysis access  Plan: Shuntogram +/- intervention today Ruta Hinds @TODAY @ 11:51 AM \

## 2018-08-13 NOTE — Op Note (Signed)
Procedure: Right arm fistulogram, angioplasty right arm AV fistula  Preoperative diagnosis: Poorly functioning AV fistula right arm  Postoperative diagnosis: Same  Anesthesia: Local  Operative findings: 4 cm length narrowing of 80% mid right arm AV fistula angioplastied to less than 20% residual stenosis  Operative details: After obtaining informed consent, patient was taken to the [facility] lab.  The patient placed in supine position Angio table.  Patient's right upper extremity prepped and draped in usual sterile fashion.  Ultrasound was used to identify the patient's right arm AV fistula.  A permanent copy was obtained and placed in the patient's medical record.  The fistula was patent.  Extra micro puncture needle was used to cannulate the right upper arm AV fistula.  Micropuncture sheath was advanced into the fistula but there was some resistance initially.  I was able to get enough purchase with the sheath to enter the fistula.  Contrast angiogram was then performed which showed a 4 cm in length narrowing of about 80% in the midportion of the fistula.  An 035 angled Glidewire was then advanced through the micropuncture sheath and I was able to cross the lesion up into the central venous circulation.  Micropuncture sheath was removed and exchanged for 7 French short sheath.  A 5 French straight catheter was then used to exchange the [employer] for an [employer] wire.  The patient was given 3000 units of intravenous heparin.  I then sequentially dilated the area of stenosis with a 5x 40 balloon followed by a 7x 40 balloon both inflated to 20 atm.  I then used a 7 x 20 balloon to try to dilate the central area of narrowing.  There is still a residual waist in the very central portion of the lesion.  A 8x 40 balloon was then advanced and centered on the lesion and inflated to 20 atm for 1 minute.  There is still about a 20% residual waist but on completion angiogram we had a nice 6 mm lumen all the way through  the fistula and the fistula had gone from pulsatile to a thrill.  At this point I decided to conclude the procedure.  The guidewire was removed.  A Monocryl stitch was placed in a figure-of-eight fashion at the sheath insertion site.  Sheath was then removed and hemostasis obtained with direct pressure.  Patient tolerated procedure well and there were no complications.  The patient taken to holding area in stable condition.  Operative management: Patient's right arm AV fistula is ready for use.  If he develops poorly functioning over time he could be candidate once again for angioplasty of this area.  Ruta Hinds, MD [facility] [place] Office: [phone] Pager: [phone]

## 2018-08-13 NOTE — Discharge Instructions (Signed)

## 2018-08-13 NOTE — Progress Notes (Signed)
Reedy Biernat Rosana Hoes, RN and Beverly Gust, RN received permission via telephone from [place] to proceed with fistulogram.

## 2018-08-14 ENCOUNTER — Inpatient Hospital Stay
Admission: RE | Admit: 2018-08-14 | Discharge: 2018-11-22 | Disposition: A | Discharge status: Home / Self Care | Payer: Medicare Other | Source: Ambulatory Visit | Attending: Internal Medicine | Admitting: Internal Medicine

## 2018-08-14 DIAGNOSIS — Z992 Dependence on renal dialysis: Secondary | ICD-10-CM | POA: Diagnosis not present

## 2018-08-14 DIAGNOSIS — N2581 Secondary hyperparathyroidism of renal origin: Secondary | ICD-10-CM | POA: Diagnosis not present

## 2018-08-14 DIAGNOSIS — D631 Anemia in chronic kidney disease: Secondary | ICD-10-CM | POA: Diagnosis not present

## 2018-08-14 DIAGNOSIS — L988 Other specified disorders of the skin and subcutaneous tissue: Principal | ICD-10-CM

## 2018-08-14 DIAGNOSIS — D509 Iron deficiency anemia, unspecified: Secondary | ICD-10-CM | POA: Diagnosis not present

## 2018-08-14 DIAGNOSIS — N186 End stage renal disease: Secondary | ICD-10-CM | POA: Diagnosis not present

## 2018-08-16 ENCOUNTER — Encounter (HOSPITAL_COMMUNITY): Payer: Self-pay | Admitting: Vascular Surgery

## 2018-08-17 ENCOUNTER — Encounter: Payer: Self-pay | Admitting: Internal Medicine

## 2018-08-17 ENCOUNTER — Non-Acute Institutional Stay (SKILLED_NURSING_FACILITY): Payer: Medicare Other | Admitting: Internal Medicine

## 2018-08-17 DIAGNOSIS — D509 Iron deficiency anemia, unspecified: Secondary | ICD-10-CM | POA: Diagnosis not present

## 2018-08-17 DIAGNOSIS — N2581 Secondary hyperparathyroidism of renal origin: Secondary | ICD-10-CM | POA: Diagnosis not present

## 2018-08-17 DIAGNOSIS — E1121 Type 2 diabetes mellitus with diabetic nephropathy: Secondary | ICD-10-CM | POA: Diagnosis not present

## 2018-08-17 DIAGNOSIS — N186 End stage renal disease: Secondary | ICD-10-CM | POA: Diagnosis not present

## 2018-08-17 DIAGNOSIS — Z794 Long term (current) use of insulin: Secondary | ICD-10-CM

## 2018-08-17 DIAGNOSIS — E782 Mixed hyperlipidemia: Secondary | ICD-10-CM

## 2018-08-17 DIAGNOSIS — Z992 Dependence on renal dialysis: Secondary | ICD-10-CM | POA: Diagnosis not present

## 2018-08-17 DIAGNOSIS — D631 Anemia in chronic kidney disease: Secondary | ICD-10-CM | POA: Diagnosis not present

## 2018-08-17 NOTE — Progress Notes (Signed)
Location:    [facility] Room Number: 112/D Place of Service:  SNF (31) Provider: Veleta Miners MD  [name], MD  Patient Care Team: [name], MD as PCP - General (Internal Medicine) Cassandria Anger, MD as Consulting Physician (Endocrinology) Fran Lowes, MD as Consulting Physician (Nephrology) Gala Romney Cristopher Estimable, MD as Consulting Physician (Gastroenterology)  Extended Emergency Contact Information Primary Emergency Contact: Betsey Amen Address: DSS  [place] Phone: [phone] Work Phone: [phone] Relation: Legal Guardian Secondary Emergency Contact: Attia,Richard Address: [address]          [place], [place] [address] [place] Phone: [phone] Relation: Brother  Code Status:  Full Code Goals of care: Advanced Directive information Advanced Directives 08/17/2018  Does Patient Have a Medical Advance Directive? Yes  Type of Advance Directive Out of facility DNR (pink MOST or yellow form)  Does patient want to make changes to medical advance directive? No - Patient declined  Copy of [place] in Chart? No - copy requested  Would patient like information on creating a medical advance directive? No - Patient declined  Pre-existing out of facility DNR order (yellow form or pink MOST form) -     Chief Complaint  Patient presents with  . Acute Visit    F/U BS,     HPI:  Pt is a 72 y.o. male seen today for an acute visit for follow up of his BS.  Patient is Long term resident of facility. 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 Today was a follow up visit.  Patient continues to be Non Compliant with his Diet and his family brings him fast food everyday.He has gained few more Pounds. But his BS are doing well now and staying mostly less then 200. Patient did not have any complains today    Past Medical History:  Diagnosis Date  . Abnormal CT scan, kidney 10/06/2011  . Acute pyelonephritis 10/07/2011  . Anemia    normocytic  . Anxiety    mental retardation  . Bladder wall thickening 10/06/2011  . BPH (benign prostatic hypertrophy)   . Diabetes mellitus   . Dialysis patient [facility])    Tuesday, Thursday and Saturday,   . Edema     history of lower extremity edema  . GERD (gastroesophageal reflux disease)   . Heme positive stool   . Hydronephrosis   . Hyperkalemia   . Hyperlipidemia   . Hypernatremia   . Hypertension   . Hypothyroidism   . Impaired speech   . 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 MR (mental retardation)   . Muscle weakness   . Obstructive uropathy   . 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 Poor historian poor historian  . Protein calorie malnutrition ([place])   . Pyelonephritis   . Renal failure (ARF), acute on chronic (HCC)   . Renal insufficiency    chronic history  . Sepsis ([place])   . Smoking   . Uremia   . Urinary retention   . UTI (lower urinary tract infection) 10/06/2011   Past Surgical History:  Procedure Laterality Date  . A/V FISTULAGRAM N/A 08/13/2018   Procedure: A/V FISTULAGRAM - Right Upper;  Surgeon:  Elam Dutch, MD;  Location: [place] [facility];  Service: Cardiovascular;  Laterality: N/A;  . AV FISTULA PLACEMENT Left 07/06/2015   Procedure:  INSERTION LEFT ARM ARTERIOVENOUS GORTEX GRAFT;  Surgeon: Angelia Mould, MD;  Location: [place];  Service: Vascular;  Laterality: Left;  . AV FISTULA PLACEMENT Right 02/26/2016   Procedure: ARTERIOVENOUS (AV) FISTULA CREATION ;  Surgeon: Angelia Mould, MD;  Location: [place];  Service: Vascular;  Laterality: Right;  . [place] REMOVAL Left  10/09/2015   Procedure: REMOVAL OF ARTERIOVENOUS GORETEX GRAFT ([place]) Evacuation of Lymphocele, Vein Patch angioplasty of brachial artery.;  Surgeon: Angelia Mould, MD;  Location: [place];  Service: Vascular;  Laterality: Left;  . BASCILIC VEIN TRANSPOSITION Right 02/26/2016   Procedure: Right BASCILIC VEIN TRANSPOSITION;  Surgeon: Angelia Mould, MD;  Location: [place];  Service: Vascular;  Laterality: Right;  . CIRCUMCISION N/A 01/04/2014   Procedure: CIRCUMCISION ADULT (procedure #1);  Surgeon: Marissa Nestle, MD;  Location[facility];  Service: Urology;  Laterality: N/A;  . COLECTOMY N/A 05/04/2017   Procedure: TOTAL COLECTOMY;  Surgeon: Aviva Signs, MD;  Location: [facility];  Service: General;  Laterality: N/A;  . COLONOSCOPY N/A 04/27/2017   Procedure: COLONOSCOPY;  Surgeon: Daneil Dolin, MD;  [facility];  Service: Endoscopy;  Laterality: N/A;  245  . CYSTOSCOPY W/ RETROGRADES Bilateral 06/29/2015   Procedure: CYSTOSCOPY, DILATION OF URETHRAL STRICTURE WITH BILATERAL RETROGRADE PYELOGRAM,SUPRAPUBIC TUBE CHANGE;  Surgeon: Festus Aloe, MD;  Location: [facility];  Service: Urology;  Laterality: Bilateral;  . CYSTOSCOPY WITH URETHRAL DILATATION N/A 12/29/2013   Procedure: CYSTOSCOPY WITH URETHRAL DILATATION;  Surgeon: Marissa Nestle, MD;  [facility];  Service: Urology;  Laterality: N/A;  . ESOPHAGOGASTRODUODENOSCOPY N/A 04/27/2017   Procedure: ESOPHAGOGASTRODUODENOSCOPY (EGD);  Surgeon: Daneil Dolin, MD;  [facility];  Service: Endoscopy;  Laterality: N/A;  . IR THROMBECTOMY AV FISTULA W/THROMBOLYSIS/PTA INC/SHUNT/IMG RIGHT Right 04/26/2018  . IR US GUIDE VASC ACCESS RIGHT  04/26/2018  . ORIF FEMUR FRACTURE Right 11/22/2016   Procedure: OPEN REDUCTION INTERNAL FIXATION (ORIF) DISTAL FEMUR FRACTURE;  Surgeon: Rod Can, MD;  Location: [place];  Service: Orthopedics;  Laterality: Right;  . PATCH ANGIOPLASTY Right 12/10/2017   Procedure: PATCH ANGIOPLASTY;   Surgeon: Angelia Mould, MD;  Location: [facility] OR;  Service: Vascular;  Laterality: Right;  . PERIPHERAL VASCULAR BALLOON ANGIOPLASTY  08/13/2018   Procedure: PERIPHERAL VASCULAR BALLOON ANGIOPLASTY;  Surgeon: Elam Dutch, MD;  Location: [place] [facility];  Service: Cardiovascular;;  right AV fistula   . PERIPHERAL VASCULAR CATHETERIZATION N/A 10/08/2015   Procedure: A/V Shuntogram;  Surgeon: Angelia Mould, MD;  Location: [place] [facility];  Service: Cardiovascular;  Laterality: N/A;  . THROMBECTOMY W/ EMBOLECTOMY Right 12/10/2017   Procedure: THROMBECTOMY REVISION RIGHT ARM  ARTERIOVENOUS FISTULA;  Surgeon: Angelia Mould, MD;  Location: [place];  Service: Vascular;  Laterality: Right;  . TRANSURETHRAL RESECTION OF PROSTATE N/A 01/04/2014   Procedure: TRANSURETHRAL RESECTION OF THE PROSTATE (TURP) (procedure #2);  Surgeon: Marissa Nestle, MD;  [facility];  Service: Urology;  Laterality: N/A;    No Known Allergies  Outpatient Encounter Medications as of 08/17/2018  Medication Sig  . atorvastatin (LIPITOR) 80 MG tablet Take 80 mg by mouth every evening.  . insulin aspart (NOVOLOG) 100 UNIT/ML injection Inject 5 Units into the skin 2 (two) times daily. If blood sugar is >150  . Insulin Glargine [facility])  100 UNIT/ML SOPN Inject 18 Units into the skin at bedtime.   Marland Kitchen levothyroxine (SYNTHROID, LEVOTHROID) 88 MCG tablet Take 88 mcg by mouth daily before breakfast.  . linagliptin (TRADJENTA) 5 MG TABS tablet Take 5 mg by mouth every evening.   . midodrine (PROAMATINE) 5 MG tablet Take 5 mg by mouth 3 (three) times a week. Take 5 mg on Tues, Thurs, and Sat at dialysis  . Multiple Vitamin (MULTIVITAMIN WITH MINERALS) TABS tablet Take 1 tablet by mouth every evening.   . Nutritional Supplements (NEPRO/CARBSTEADY PO) Take 1 Can by mouth 2 (two) times daily.  Marland Kitchen omeprazole (PRILOSEC) 40 MG capsule Take 40 mg by mouth at bedtime.   . polyethylene glycol (MIRALAX  / GLYCOLAX) packet Take 17 g by mouth as needed for mild constipation.   . sevelamer carbonate (RENVELA) 800 MG tablet Take 1,600-3,200 mg by mouth See admin instructions. Take 4 tablets (3200mg ) by mouth 3 times daily with meals, and 2 tablets (1600mg ) by mouth with bedtime snack  . tamsulosin (FLOMAX) 0.4 MG CAPS capsule Take 0.4 mg by mouth every evening. Give 30 minutes after a meal  . torsemide (DEMADEX) 10 MG tablet Take 5 tablets (50 mg total) by mouth daily.   No facility-administered encounter medications on file as of 08/17/2018.      Review of Systems  Review of Systems  Constitutional: Negative for activity change, appetite change, chills, diaphoresis, fatigue and fever.  HENT: Negative for mouth sores, postnasal drip, rhinorrhea, sinus pain and sore throat.   Respiratory: Negative for apnea, cough, chest tightness, shortness of breath and wheezing.   Cardiovascular: Negative for chest pain, palpitations and leg swelling.  Gastrointestinal: Negative for abdominal distention, abdominal pain, constipation, diarrhea, nausea and vomiting.  Genitourinary: Negative for dysuria and frequency.  Musculoskeletal: Negative for arthralgias, joint swelling and myalgias.  Skin: Negative for rash.  Neurological: Negative for dizziness, syncope, weakness, light-headedness and numbness.  Psychiatric/Behavioral: Negative for behavioral problems, confusion and sleep disturbance.     Immunization History  Administered Date(s) Administered  . Influenza,inj,Quad PF,6+ Mos 12/26/2013, 07/09/2015  . Influenza-Unspecified 07/31/2017  . PPD Test 03/07/2015  . Pneumococcal Conjugate-13 07/31/2017  . Pneumococcal Polysaccharide-23 03/04/2015  . Tdap 08/22/2017   Pertinent  Health Maintenance Due  Topic Date Due  . OPHTHALMOLOGY EXAM  08/21/2018 (Originally 08/23/1956)  . URINE MICROALBUMIN  08/21/2018 (Originally 08/23/1956)  . HEMOGLOBIN A1C  01/22/2019  . FOOT EXAM  05/20/2019  . COLONOSCOPY   04/28/2027  . INFLUENZA VACCINE  Completed  . PNA vac Low Risk Adult  Completed   Fall Risk  02/18/2018  Falls in the past year? No   Functional Status Survey:    Vitals:   08/17/18 1429  BP: 126/79  Pulse: 82  Resp: 18  Temp: (!) 97.4 F (36.3 C)  TempSrc: Oral  SpO2: 97%   There is no height or weight on file to calculate BMI. Physical Exam  Constitutional: He appears well-developed and well-nourished.  HENT:  Head: Normocephalic.  Mouth/Throat: Oropharynx is clear and moist.  Eyes: Pupils are equal, round, and reactive to light.  Neck: Neck supple.  Cardiovascular: Normal rate and normal heart sounds.  Pulmonary/Chest: Effort normal and breath sounds normal. No respiratory distress. He has no wheezes.  Abdominal: Soft. Bowel sounds are normal. He exhibits no distension. There is no tenderness. There is no rebound.  Musculoskeletal: He exhibits no edema.  Neurological: He is alert.  Skin: Skin is warm and dry.  Psychiatric: He has  a normal mood and affect. His behavior is normal. Thought content normal.    Labs reviewed: Recent Labs    01/22/18 0945 04/22/18 0718 04/26/18 1236 07/24/18 0737 08/13/18 0935  NA 137 137 137 136 139  K 3.4* 4.3 4.8 4.7 5.2*  CL 94* 101 102 99 103  CO2 21* 25 21* 26  --   GLUCOSE 143* 271* 165* 215* 176*  BUN 61* 51* 62* 29* 57*  CREATININE 10.63* 9.14* 11.66* 8.42* 12.10*  CALCIUM 8.2* 8.9 8.6* 8.9  --   PHOS 7.9*  --   --   --   --    Recent Labs    01/19/18 1608 01/22/18 0945  AST 22  --   ALT 18  --   ALKPHOS 152*  --   BILITOT 0.8  --   PROT 6.8  --   ALBUMIN 3.3* 3.2*   Recent Labs    01/19/18 0700 01/19/18 1608  04/22/18 0718 04/26/18 1236 07/24/18 0737 08/13/18 0935  WBC 8.9 7.4   < > 6.6 8.7 5.4  --   NEUTROABS 6.8 5.3  --  3.5  --   --   --   HGB 11.6* 11.0*   < > 11.7* 11.0* 11.4* 12.9*  HCT 36.3* 34.8*   < > 36.0* 33.7* 34.6* 38.0*  MCV 106.1* 106.4*   < > 105.6* 105.0* 107.1*  --   PLT 145* 143*    < > 142* 125* 128*  --    < > = values in this interval not displayed.   Lab Results  Component Value Date   TSH 3.556 05/25/2018   Lab Results  Component Value Date   HGBA1C 8.5 (H) 07/24/2018   Lab Results  Component Value Date   CHOL 80 07/03/2018   HDL 19 (L) 07/03/2018   LDLCALC UNABLE TO CALCULATE IF TRIGLYCERIDE OVER 400 mg/dL 07/03/2018   TRIG 481 (H) 07/03/2018   CHOLHDL 4.2 07/03/2018    Significant Diagnostic Results in last 30 days:  No results found.  Assessment/Plan  Uncontrolled Diabetes Mellitus with Hypertriglyceridemia BS better controlled Continue on Lantus 18 units with Novolog Bolus with Meals BID. Continue Accu check. Continue Januvia Repeat A1C next visit Continue to Councel for Diet Modification Family/ staff Communication:   Labs/tests ordered:   Total time spent in this patient care encounter was _25 minutes; greater than 50% of the visit spent counseling patient, reviewing records , Labs and coordinating care for problems addressed at this encounter.

## 2018-08-19 ENCOUNTER — Non-Acute Institutional Stay (SKILLED_NURSING_FACILITY): Payer: Medicare Other | Admitting: Internal Medicine

## 2018-08-19 ENCOUNTER — Encounter: Payer: Self-pay | Admitting: Internal Medicine

## 2018-08-19 DIAGNOSIS — N186 End stage renal disease: Secondary | ICD-10-CM

## 2018-08-19 DIAGNOSIS — D631 Anemia in chronic kidney disease: Secondary | ICD-10-CM | POA: Diagnosis not present

## 2018-08-19 DIAGNOSIS — D638 Anemia in other chronic diseases classified elsewhere: Secondary | ICD-10-CM

## 2018-08-19 DIAGNOSIS — E039 Hypothyroidism, unspecified: Secondary | ICD-10-CM | POA: Diagnosis not present

## 2018-08-19 DIAGNOSIS — E1121 Type 2 diabetes mellitus with diabetic nephropathy: Secondary | ICD-10-CM | POA: Diagnosis not present

## 2018-08-19 DIAGNOSIS — K529 Noninfective gastroenteritis and colitis, unspecified: Secondary | ICD-10-CM | POA: Diagnosis not present

## 2018-08-19 DIAGNOSIS — Z794 Long term (current) use of insulin: Secondary | ICD-10-CM

## 2018-08-19 DIAGNOSIS — Z992 Dependence on renal dialysis: Secondary | ICD-10-CM | POA: Diagnosis not present

## 2018-08-19 DIAGNOSIS — N2581 Secondary hyperparathyroidism of renal origin: Secondary | ICD-10-CM | POA: Diagnosis not present

## 2018-08-19 DIAGNOSIS — R112 Nausea with vomiting, unspecified: Secondary | ICD-10-CM | POA: Diagnosis not present

## 2018-08-19 DIAGNOSIS — D509 Iron deficiency anemia, unspecified: Secondary | ICD-10-CM | POA: Diagnosis not present

## 2018-08-19 NOTE — Progress Notes (Signed)
Location:    [facility] Room Number: 112/D Place of Service:  SNF (31) Provider:  Glennon Hamilton, MD  Patient Care Team: [name], MD as PCP - General (Internal Medicine) Cassandria Anger, MD as Consulting Physician (Endocrinology) Fran Lowes, MD as Consulting Physician (Nephrology) Gala Romney Cristopher Estimable, MD as Consulting Physician (Gastroenterology)  Extended Emergency Contact Information Primary Emergency Contact: Betsey Amen Address: DSS  [place] Phone: [phone] Work Phone: [phone] Relation: Legal Guardian Secondary Emergency Contact: Thone,Richard Address: [address]          [place], [place] [address] [place] Phone: [phone] Relation: Brother  Code Status:  Full Code Goals of care: Advanced Directive information Advanced Directives 08/19/2018  Does Patient Have a Medical Advance Directive? Yes  Type of Advance Directive (No Data)  Does patient want to make changes to medical advance directive? No - Patient declined  Copy of [place] in Chart? No - copy requested  Would patient like information on creating a medical advance directive? No - Patient declined  Pre-existing out of facility DNR order (yellow form or pink MOST form) -    Chief complaint- acute visit secondary to nausea and vomiting.  Routine visit for medical management of chronic medical conditions including end-stage renal disease on dialysis- diabetes type 2- anemia with history of GI bleed in the past as well as a history of right distal femoral fracture in addition to hyperlipidemia hypertriglyceridemia BPH and hypothyroidism.  History of present illness.  Patient is a pleasant 72 year old male with the above diagnoses.  Acute issue was nausea and vomiting which apparently started earlier today at dialysis- he is also an return to the facility.  He does have a previous  history of gastroenteritis and actually went to the hospital in March  with low-grade temp as well as tachycardia and persistent nausea and vomiting  Initially was treated aggressively with antibiotics but this was stopped after white count returned within normal limits he was afebrile appeared to improve with supportive care- thought this was viral gastroenteritis.--He did have a CT scan done at the time that showed enteritis of the small bowel.    Currently he is not in any distress vital signs are stable he is afebrile he says he is feeling a bit better he did receive Zofran at dialysis as well as a dose here.  His other medical issues appear to be relatively stable he does have a history of type 2 diabetes this is been complicated with some dietary indiscretions- especially later in the day- Dr. Lyndel Safe did recently adjust his regimen he is on Lantus 18 units with NovoLog bolus twice daily with meals as well as Januvia  This appears to be helping some morning blood sugars appear to be in the mid 100s occasionally 200s-later in the day noontime blood sugars appear to be averaging more in the mid 100s- a bit more variability in the evening with frequent readings higher 100s to low 200s but overall trend appears to be improving.  Hemoglobin A1c was 8.5 on recent lab.  Regards end-stage renal disease he appears to be tolerating dialysis well- he does have Midrin as needed for hypotension on dialysis days.  Actually postdialysis today his blood sugar appears fairly stable at 104/66.  Occasionally has tachycardia which is asymptomatic pulse is in the 90s today despite the vomiting  He does have a history of anemia previously with GI bleed with  history of colectomy in the past-  Actually when he first came here he had a right distal femur fracture that was repaired this was complicated with a subsequent DVT-he had been on anticoagulation but developed a GI bleed that again required a colectomy at  one point.  He is no longer on anticoagulation but his hemoglobin has consistently shown relative stability it was 11.4 on lab done in September and updated dialysis labs done on October 22 showed a hemoglobin of 10.8 this appears to be within his normal range.  When he went to the hospital earlier this year he did do a CT scan that showed an incidental pancreatic head lesion there was recommendation for follow-up in approximately 2 years he continues to be asymptomatic    He does have a history of hyperlipidemia as well as elevated triglycerides he is on Lipitor- LDL on recent lab was difficult to calculate because of his elevated triglycerides which were in the 400 range again this is complicated with his noncompliance often with his diet.        HPI:  Pt is a 72 y.o. male seen today for an acute visit for    Past Medical History:  Diagnosis Date  . Abnormal CT scan, kidney 10/06/2011  . Acute pyelonephritis 10/07/2011  . Anemia    normocytic  . Anxiety    mental retardation  . Bladder wall thickening 10/06/2011  . BPH (benign prostatic hypertrophy)   . Diabetes mellitus   . Dialysis patient [facility])    Tuesday, Thursday and Saturday,   . Edema     history of lower extremity edema  . GERD (gastroesophageal reflux disease)   . Heme positive stool   . Hydronephrosis   . Hyperkalemia   . Hyperlipidemia   . Hypernatremia   . Hypertension   . Hypothyroidism   . Impaired speech   . 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 MR (mental retardation)   . Muscle weakness   . Obstructive uropathy   . 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 Poor historian poor historian  . Protein calorie malnutrition ([place])   . Pyelonephritis   . Renal failure (ARF), acute on chronic (HCC)   . Renal insufficiency    chronic history  . Sepsis ([place])   . Smoking   . Uremia   . Urinary retention   . UTI (lower urinary tract infection) 10/06/2011   Past Surgical History:  Procedure Laterality  Date  . A/V FISTULAGRAM N/A 08/13/2018   Procedure: A/V FISTULAGRAM - Right Upper;  Surgeon: Elam Dutch, MD;  Location: [place] CV LAB;  Service: Cardiovascular;  Laterality: N/A;  . AV FISTULA PLACEMENT Left 07/06/2015   Procedure:  INSERTION LEFT ARM ARTERIOVENOUS GORTEX GRAFT;  Surgeon: Angelia Mould, MD;  Location: [place];  Service: Vascular;  Laterality: Left;  . AV FISTULA PLACEMENT Right 02/26/2016   Procedure: ARTERIOVENOUS (AV) FISTULA CREATION ;  Surgeon: Angelia Mould, MD;  Location: [place];  Service: Vascular;  Laterality: Right;  . [place] REMOVAL Left 10/09/2015   Procedure: REMOVAL OF ARTERIOVENOUS GORETEX GRAFT ([place]) Evacuation of Lymphocele, Vein Patch angioplasty of brachial artery.;  Surgeon: Angelia Mould, MD;  Location: [place];  Service: Vascular;  Laterality: Left;  . BASCILIC VEIN TRANSPOSITION Right 02/26/2016   Procedure: Right BASCILIC VEIN TRANSPOSITION;  Surgeon: Angelia Mould, MD;  Location: [place];  Service: Vascular;  Laterality: Right;  . CIRCUMCISION N/A 01/04/2014   Procedure: CIRCUMCISION ADULT (procedure #1);  Surgeon: Marissa Nestle, MD;  Location: [facility];  Service: Urology;  Laterality: N/A;  . COLECTOMY N/A 05/04/2017   Procedure: TOTAL COLECTOMY;  Surgeon: Aviva Signs, MD;  Location: [facility];  Service: General;  Laterality: N/A;  . COLONOSCOPY N/A 04/27/2017   Procedure: COLONOSCOPY;  Surgeon: Daneil Dolin, MD;  [facility];  Service: Endoscopy;  Laterality: N/A;  245  . CYSTOSCOPY W/ RETROGRADES Bilateral 06/29/2015   Procedure: CYSTOSCOPY, DILATION OF URETHRAL STRICTURE WITH BILATERAL RETROGRADE PYELOGRAM,SUPRAPUBIC TUBE CHANGE;  Surgeon: Festus Aloe, MD;  Location: [facility];  Service: Urology;  Laterality: Bilateral;  . CYSTOSCOPY WITH URETHRAL DILATATION N/A 12/29/2013   Procedure: CYSTOSCOPY WITH URETHRAL DILATATION;  Surgeon: Marissa Nestle, MD;  [facility];  Service: Urology;  Laterality: N/A;  .  ESOPHAGOGASTRODUODENOSCOPY N/A 04/27/2017   Procedure: ESOPHAGOGASTRODUODENOSCOPY (EGD);  Surgeon: Daneil Dolin, MD;  [facility];  Service: Endoscopy;  Laterality: N/A;  . IR THROMBECTOMY AV FISTULA W/THROMBOLYSIS/PTA INC/SHUNT/IMG RIGHT Right 04/26/2018  . IR US GUIDE VASC ACCESS RIGHT  04/26/2018  . ORIF FEMUR FRACTURE Right 11/22/2016   Procedure: OPEN REDUCTION INTERNAL FIXATION (ORIF) DISTAL FEMUR FRACTURE;  Surgeon: Rod Can, MD;  Location: [place];  Service: Orthopedics;  Laterality: Right;  . PATCH ANGIOPLASTY Right 12/10/2017   Procedure: PATCH ANGIOPLASTY;  Surgeon: Angelia Mould, MD;  Location[facility] OR;  Service: Vascular;  Laterality: Right;  . PERIPHERAL VASCULAR BALLOON ANGIOPLASTY  08/13/2018   Procedure: PERIPHERAL VASCULAR BALLOON ANGIOPLASTY;  Surgeon: Elam Dutch, MD;  Location: [place] CV LAB;  Service: Cardiovascular;;  right AV fistula   . PERIPHERAL VASCULAR CATHETERIZATION N/A 10/08/2015   Procedure: A/V Shuntogram;  Surgeon: Angelia Mould, MD;  Location: [place] [facility];  Service: Cardiovascular;  Laterality: N/A;  . THROMBECTOMY W/ EMBOLECTOMY Right 12/10/2017   Procedure: THROMBECTOMY REVISION RIGHT ARM  ARTERIOVENOUS FISTULA;  Surgeon: Angelia Mould, MD;  Location: [place];  Service: Vascular;  Laterality: Right;  . TRANSURETHRAL RESECTION OF PROSTATE N/A 01/04/2014   Procedure: TRANSURETHRAL RESECTION OF THE PROSTATE (TURP) (procedure #2);  Surgeon: Marissa Nestle, MD;  [facility];  Service: Urology;  Laterality: N/A;    No Known Allergies  Outpatient Encounter Medications as of 08/19/2018  Medication Sig  . atorvastatin (LIPITOR) 80 MG tablet Take 80 mg by mouth every evening.  . insulin aspart (NOVOLOG) 100 UNIT/ML injection Inject 5 Units into the skin 2 (two) times daily. If blood sugar is >150  . Insulin Glargine (BASAGLAR KWIKPEN) 100 UNIT/ML SOPN Inject 18 Units into the skin at bedtime.   Marland Kitchen  levothyroxine (SYNTHROID, LEVOTHROID) 88 MCG tablet Take 88 mcg by mouth daily before breakfast.  . linagliptin (TRADJENTA) 5 MG TABS tablet Take 5 mg by mouth every evening.   . midodrine (PROAMATINE) 5 MG tablet Take 5 mg by mouth 3 (three) times a week. Take 5 mg on Tues, Thurs, and Sat at dialysis  . Multiple Vitamin (MULTIVITAMIN WITH MINERALS) TABS tablet Take 1 tablet by mouth every evening.   . Nutritional Supplements (NEPRO/CARBSTEADY PO) Take 1 Can by mouth 2 (two) times daily.  Marland Kitchen omeprazole (PRILOSEC) 40 MG capsule Take 40 mg by mouth at bedtime.   . polyethylene glycol (MIRALAX / GLYCOLAX) packet Take 17 g by mouth as needed for mild constipation.   . sevelamer carbonate (RENVELA) 800 MG tablet Take 1,600-3,200 mg by mouth See admin instructions. Take 4 tablets (3200mg ) by mouth 3 times daily with  meals, and 2 tablets (1600mg ) by mouth with bedtime snack  . tamsulosin (FLOMAX) 0.4 MG CAPS capsule Take 0.4 mg by mouth every evening. Give 30 minutes after a meal  . torsemide (DEMADEX) 10 MG tablet Take 5 tablets (50 mg total) by mouth daily.   No facility-administered encounter medications on file as of 08/19/2018.     Review of Systems   This is somewhat limited with patient's diagnosis of mild cognitive deficits.  In general he does not complain of fever chills says he is feeling a bit better now skin is not complain of diaphoresis rashes or itching does have fistula site right upper arm.  Head ears eyes nose mouth and throat not complain of visual changes sore throat or difficulty swallowing.  Elder Love does not complain of being short of breath or having a cough.  Cardiac does not complain of any chest pain has fairly mild lower extremity edema bilaterally.  GI has had some nausea and vomiting but does not complain of abdominal pain currently or diarrhea apparently had a bowel movement this morning per nurses.  GU is not complaining of any burning with  urination.  Musculoskeletal is not complaining of joint pain at this time.  Neurologic does not complain of being dizzy or having a headache or having numbness.  Psych does not complain of being depressed or anxious.    Immunization History  Administered Date(s) Administered  . Influenza,inj,Quad PF,6+ Mos 12/26/2013, 07/09/2015  . Influenza-Unspecified 07/31/2017  . PPD Test 03/07/2015  . Pneumococcal Conjugate-13 07/31/2017  . Pneumococcal Polysaccharide-23 03/04/2015  . Tdap 08/22/2017   Pertinent  Health Maintenance Due  Topic Date Due  . OPHTHALMOLOGY EXAM  08/21/2018 (Originally 08/23/1956)  . URINE MICROALBUMIN  08/21/2018 (Originally 08/23/1956)  . HEMOGLOBIN A1C  01/22/2019  . FOOT EXAM  05/20/2019  . COLONOSCOPY  04/28/2027  . INFLUENZA VACCINE  Completed  . PNA vac Low Risk Adult  Completed   Fall Risk  02/18/2018  Falls in the past year? No   Functional Status Survey:    Vitals:   08/19/18 1516  BP: 104/66  Pulse: 99  Resp: 18  Temp: 98.7 F (37.1 C)  TempSrc: Oral  SpO2: 100%  Weight is 208.4 pounds Physical Exam General this is a pleasant male in no distress he is lying in bed but does not appear to be uncomfortable.  His skin is warm and dry is not diaphoretic he does have a fistula right upper arm.  Eyes sclera and conjunctive are clear visual acuity appears to be intact.  Oropharynx is clear mucous membranes moist.  Chest is clear to auscultation there is no labored breathing respiratory effort is slightly poor.  Heart is regular rate and rhythm in the 80s on auscultation he has quite mild lower extremity edema.  Abdomen is soft it is not tender there are active bowel sounds it is not overtly distended.  Musculoskeletal Limited exam since he is in bed but able to move his upper and lower extremities at baseline he turns in bed without difficulty.  Neurologic is grossly intact his speech is clear no lateralizing findings.  Psych he is  oriented to self is pleasant appropriate follows verbal commands without difficulty appears to answer simple questions appropriately.   Labs reviewed:  Labs reviewed from dialysis.  August 17, 2018.  Hemoglobin was 10.8.  August 10, 2018.  Sodium was 139 potassium 4.7 BUN 54 creatinine was 10.45 Recent Labs    01/22/18 0945 04/22/18 0718 04/26/18  1236 07/24/18 0737 08/13/18 0935  NA 137 137 137 136 139  K 3.4* 4.3 4.8 4.7 5.2*  CL 94* 101 102 99 103  CO2 21* 25 21* 26  --   GLUCOSE 143* 271* 165* 215* 176*  BUN 61* 51* 62* 29* 57*  CREATININE 10.63* 9.14* 11.66* 8.42* 12.10*  CALCIUM 8.2* 8.9 8.6* 8.9  --   PHOS 7.9*  --   --   --   --    Recent Labs    01/19/18 1608 01/22/18 0945  AST 22  --   ALT 18  --   ALKPHOS 152*  --   BILITOT 0.8  --   PROT 6.8  --   ALBUMIN 3.3* 3.2*   Recent Labs    01/19/18 0700 01/19/18 1608  04/22/18 0718 04/26/18 1236 07/24/18 0737 08/13/18 0935  WBC 8.9 7.4   < > 6.6 8.7 5.4  --   NEUTROABS 6.8 5.3  --  3.5  --   --   --   HGB 11.6* 11.0*   < > 11.7* 11.0* 11.4* 12.9*  HCT 36.3* 34.8*   < > 36.0* 33.7* 34.6* 38.0*  MCV 106.1* 106.4*   < > 105.6* 105.0* 107.1*  --   PLT 145* 143*   < > 142* 125* 128*  --    < > = values in this interval not displayed.   Lab Results  Component Value Date   TSH 3.556 05/25/2018   Lab Results  Component Value Date   HGBA1C 8.5 (H) 07/24/2018   Lab Results  Component Value Date   CHOL 80 07/03/2018   HDL 19 (L) 07/03/2018   LDLCALC UNABLE TO CALCULATE IF TRIGLYCERIDE OVER 400 mg/dL 07/03/2018   TRIG 481 (H) 07/03/2018   CHOLHDL 4.2 07/03/2018    Significant Diagnostic Results in last 30 days:  No results found.  Assessment/Plan #1 nausea and vomiting- at this point he does not appear to be uncomfortable appears to have responded to the Zofran is not complaining of abdominal pain vital signs appear to be stable he is afebrile- this does not appear to be acute situation as when  he presented with gastroenteritis in March but this will have to be watched.  At this point will give Zofran 4 mg every 8 hours as needed for now- also monitor vital signs pulse ox every 4 hours x 24 hours and then every shift.  We will prescribe a clear liquid diet for 24 hours and titrate up as tolerated.  This is complicated somewhat with his history of type 2 diabetes since he would not be eating his normal diet will decrease his Lantus to 9 units from 18-also hold his NovoLog at lunch and dinner as well as Tradjenta- monitor CBGs before meals at bedtime and at 3 AM notify provider if CBG is less than 60 or greater than 250.  Again clinically he appears stable but this will have to be watched hopefully this is more of a passing virus type presentation.  2.  Type 2 diabetes as noted above will make temporary changes because of the nausea and vomiting he normally is on 18 units of Lantus with NovoLog boluses twice daily at lunch and supper- as well as Tradjenta- recent adjustments were made and blood sugars appear to be somewhat improved-last hemoglobin A1c was 8.5- again this is somewhat challenging with his history of dietary noncompliance and he has been advised to try to adhere to prescribed diet but again this  has been difficult   3- history of anemia with GI bleed in the past as noted above this is been quite stable last hemoglobin was 10.8 done earlier this week at dialysis will monitor periodically and dialysis does keep an eye on this as well  #4- history of hyperlipidemia with elevated triglycerides-he continues on Lipitor 80 mg a day again this has been somewhat complicated with his dietary noncompliance   5- history of hypothyroidism he continues on Synthroid TSH and July was within normal limits at 3.556.  6- history of lower extremity edema again this is complicated with his renal disease he is on Demadex appears to be relatively stable again there is some variation in dialysis has  been monitoring this and taking off fluids accordingly.  7.  History of end-stage renal disease again he is on dialysis he also has Midrin on dialysis days because of hypotension concerns blood pressure today after dialysis was actually pretty stable at 104/66 he does not complain of dizziness.  8.  History of BPH he continues on Flomax he does urinate per staff he is not complaining of dysuria.  9 history of pancreatic head lesion- again recommendation is for abdominal CT in approximately 18 months he is asymptomatic.  10.  History of mild mental delay he appears to be at baseline he does quite well with supportive care he has a very supportive family-- nursing does not report any behaviors he continues to be a   .  He will need to be monitored closely especially over the next 24 hours-he appears to be comfortable at this point without complaints of abdominal pain says he is feeling a bit better  [id number]-of note greater than 40 minutes spent assessing patient-reviewing his chart and labs- discussing his status with nursing staff- coordinating and formulating a plan of care for numerous diagnoses- note greater than 50% of time spent coordinating a plan of care with input as noted above

## 2018-08-20 ENCOUNTER — Encounter: Payer: Self-pay | Admitting: Internal Medicine

## 2018-08-20 ENCOUNTER — Non-Acute Institutional Stay (SKILLED_NURSING_FACILITY): Payer: Medicare Other | Admitting: Internal Medicine

## 2018-08-20 DIAGNOSIS — E1121 Type 2 diabetes mellitus with diabetic nephropathy: Secondary | ICD-10-CM

## 2018-08-20 DIAGNOSIS — R112 Nausea with vomiting, unspecified: Secondary | ICD-10-CM

## 2018-08-20 DIAGNOSIS — Z794 Long term (current) use of insulin: Secondary | ICD-10-CM

## 2018-08-20 NOTE — Progress Notes (Signed)
Location:    [facility] Room Number: 112/D Place of Service:  SNF (31) Provider:  Glennon [facility], MD  Patient Care Team: [name], MD as PCP - General (Internal Medicine) Cassandria Anger, MD as Consulting Physician (Endocrinology) Fran Lowes, MD as Consulting Physician (Nephrology) Gala Romney Cristopher Estimable, MD as Consulting Physician (Gastroenterology)  Extended Emergency Contact Information Primary Emergency Contact: Betsey Amen Address: DSS  [place] Phone: [phone] Work Phone: [phone] Relation: Legal Guardian Secondary Emergency Contact: Going,Richard Address: [address]          [place], [place] [address] [place] Phone: [phone] Relation: Brother  Code Status:  Full Code Goals of care: Advanced Directive information Advanced Directives 08/20/2018  Does Patient Have a Medical Advance Directive? Yes  Type of Advance Directive (No Data)  Does patient want to make changes to medical advance directive? No - Patient declined  Copy of [place] in Chart? No - copy requested  Would patient like information on creating a medical advance directive? No - Patient declined  Pre-existing out of facility DNR order (yellow form or pink MOST form) -     Chief Complaint  Patient presents with  . Acute Visit    F/U Vomiting    HPI:  Pt is a 72 y.o. male seen today for an acute visit for follow-up of a vomiting episode yesterday.  He is a long-term resident of facility with a history of end-stage renal disease on chronic dialysis as well as type 2 diabetes anemia with history of GI bleed as well as a history of right distal femoral fracture as well as hyperlipidemia elevated triglycerides BPH and hypothyroidism.  He also has a previous history of gastroenteritis and actually went to the hospital in March with a low-grade temp tachycardia and persistent nausea  and vomiting- this did resolve however with fluid resuscitation antibiotics were discontinued since this was thought to be viral.  .  Apparently yesterday he had a vomiting episode at dialysis- and had a subsequent episode when he returned the facility- we did reduce his diabetic meds including holding his NovoLog at lunch and dinner as well as Tradjenta-we reduce his Lantus down to 9 units.  He was given Zofran for the nausea and vomiting.  And started on a clear liquid diet.  Today he appears to be back at his baseline he is tolerating upgraded diet there was no further nausea and vomiting he is wheeling  about facility in his wheelchair and appears to be feeling well he denies any abdominal discomfort vital signs are stable   Past Medical History:  Diagnosis Date  . Abnormal CT scan, kidney 10/06/2011  . Acute pyelonephritis 10/07/2011  . Anemia    normocytic  . Anxiety    mental retardation  . Bladder wall thickening 10/06/2011  . BPH (benign prostatic hypertrophy)   . Diabetes mellitus   . Dialysis patient [facility])    Tuesday, Thursday and Saturday,   . Edema     history of lower extremity edema  . GERD (gastroesophageal reflux disease)   . Heme positive stool   . Hydronephrosis   . Hyperkalemia   . Hyperlipidemia   . Hypernatremia   . Hypertension   . Hypothyroidism   . Impaired speech   . 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 MR (mental retardation)   . Muscle weakness   . Obstructive uropathy   . Perinephric abscess  10/07/2011  . Poor historian poor historian  . Protein calorie malnutrition ([place])   . Pyelonephritis   . Renal failure (ARF), acute on chronic (HCC)   . Renal insufficiency    chronic history  . Sepsis ([place])   . Smoking   . Uremia   . Urinary retention   . UTI (lower urinary tract infection) 10/06/2011   Past Surgical History:  Procedure Laterality Date  . A/V FISTULAGRAM N/A 08/13/2018   Procedure: A/V FISTULAGRAM - Right Upper;   Surgeon: Elam Dutch, MD;  Location: [facility];  Service: Cardiovascular;  Laterality: N/A;  . AV FISTULA PLACEMENT Left 07/06/2015   Procedure:  INSERTION LEFT ARM ARTERIOVENOUS GORTEX GRAFT;  Surgeon: Angelia Mould, MD;  Location: [facility];  Service: Vascular;  Laterality: Left;  . AV FISTULA PLACEMENT Right 02/26/2016   Procedure: ARTERIOVENOUS (AV) FISTULA CREATION ;  Surgeon: Angelia Mould, MD;  Location: [facility];  Service: Vascular;  Laterality: Right;  . [place] REMOVAL Left 10/09/2015   Procedure: REMOVAL OF ARTERIOVENOUS GORETEX GRAFT ([place]) Evacuation of Lymphocele, Vein Patch angioplasty of brachial artery.;  Surgeon: Angelia Mould, MD;  Location: [facility];  Service: Vascular;  Laterality: Left;  . BASCILIC VEIN TRANSPOSITION Right 02/26/2016   Procedure: Right BASCILIC VEIN TRANSPOSITION;  Surgeon: Angelia Mould, MD;  Location: [facility];  Service: Vascular;  Laterality: Right;  . CIRCUMCISION N/A 01/04/2014   Procedure: CIRCUMCISION ADULT (procedure #1);  Surgeon: Marissa Nestle, MD;  Location: [facility];  Service: Urology;  Laterality: N/A;  . COLECTOMY N/A 05/04/2017   Procedure: TOTAL COLECTOMY;  Surgeon: Aviva Signs, MD;  Location: [facility];  Service: General;  Laterality: N/A;  . COLONOSCOPY N/A 04/27/2017   Procedure: COLONOSCOPY;  Surgeon: Daneil Dolin, MD;  [facility];  Service: Endoscopy;  Laterality: N/A;  245  . CYSTOSCOPY W/ RETROGRADES Bilateral 06/29/2015   Procedure: CYSTOSCOPY, DILATION OF URETHRAL STRICTURE WITH BILATERAL RETROGRADE PYELOGRAM,SUPRAPUBIC TUBE CHANGE;  Surgeon: Festus Aloe, MD;  Location: [facility];  Service: Urology;  Laterality: Bilateral;  . CYSTOSCOPY WITH URETHRAL DILATATION N/A 12/29/2013   Procedure: CYSTOSCOPY WITH URETHRAL DILATATION;  Surgeon: Marissa Nestle, MD;  [facility];  Service: Urology;  Laterality: N/A;  . ESOPHAGOGASTRODUODENOSCOPY N/A 04/27/2017   Procedure: ESOPHAGOGASTRODUODENOSCOPY  (EGD);  Surgeon: Daneil Dolin, MD;  [facility];  Service: Endoscopy;  Laterality: N/A;  . IR THROMBECTOMY AV FISTULA W/THROMBOLYSIS/PTA INC/SHUNT/IMG RIGHT Right 04/26/2018  . IR US GUIDE VASC ACCESS RIGHT  04/26/2018  . ORIF FEMUR FRACTURE Right 11/22/2016   Procedure: OPEN REDUCTION INTERNAL FIXATION (ORIF) DISTAL FEMUR FRACTURE;  Surgeon: Rod Can, MD;  Location: [facility];  Service: Orthopedics;  Laterality: Right;  . PATCH ANGIOPLASTY Right 12/10/2017   Procedure: PATCH ANGIOPLASTY;  Surgeon: Angelia Mould, MD;  [facility];  Service: Vascular;  Laterality: Right;  . PERIPHERAL VASCULAR BALLOON ANGIOPLASTY  08/13/2018   Procedure: PERIPHERAL VASCULAR BALLOON ANGIOPLASTY;  Surgeon: Elam Dutch, MD;  Location: [facility];  Service: Cardiovascular;;  right AV fistula   . PERIPHERAL VASCULAR CATHETERIZATION N/A 10/08/2015   Procedure: A/V Shuntogram;  Surgeon: Angelia Mould, MD;  Location: [facility];  Service: Cardiovascular;  Laterality: N/A;  . THROMBECTOMY W/ EMBOLECTOMY Right 12/10/2017   Procedure: THROMBECTOMY REVISION RIGHT ARM  ARTERIOVENOUS FISTULA;  Surgeon: Angelia Mould, MD;  Location: [facility];  Service: Vascular;  Laterality: Right;  . TRANSURETHRAL RESECTION OF PROSTATE  N/A 01/04/2014   Procedure: TRANSURETHRAL RESECTION OF THE PROSTATE (TURP) (procedure #2);  Surgeon: Marissa Nestle, MD;  [facility];  Service: Urology;  Laterality: N/A;    No Known Allergies  Outpatient Encounter Medications as of 08/20/2018  Medication Sig  . atorvastatin (LIPITOR) 80 MG tablet Take 80 mg by mouth every evening.  . Insulin Glargine (BASAGLAR KWIKPEN) 100 UNIT/ML SOPN Inject 9 Units into the skin at bedtime.   Marland Kitchen levothyroxine (SYNTHROID, LEVOTHROID) 88 MCG tablet Take 88 mcg by mouth daily before breakfast.  . midodrine (PROAMATINE) 5 MG tablet Take 5 mg by mouth 3 (three) times a week. Take 5 mg on Tues, Thurs, and Sat at  dialysis  . Multiple Vitamin (MULTIVITAMIN WITH MINERALS) TABS tablet Take 1 tablet by mouth every evening.   . Nutritional Supplements (NEPRO/CARBSTEADY PO) Take 1 Can by mouth 2 (two) times daily.  Marland Kitchen omeprazole (PRILOSEC) 40 MG capsule Take 40 mg by mouth at bedtime.   . ondansetron (ZOFRAN) 4 MG tablet Take 4 mg by mouth every 8 (eight) hours as needed for nausea or vomiting.  . polyethylene glycol (MIRALAX / GLYCOLAX) packet Take 17 g by mouth as needed for mild constipation.   . sevelamer carbonate (RENVELA) 800 MG tablet Take 1,600-3,200 mg by mouth See admin instructions. Take 4 tablets (3200mg ) by mouth 3 times daily with meals, and 2 tablets (1600mg ) by mouth with bedtime snack  . tamsulosin (FLOMAX) 0.4 MG CAPS capsule Take 0.4 mg by mouth every evening. Give 30 minutes after a meal  . torsemide (DEMADEX) 10 MG tablet Take 5 tablets (50 mg total) by mouth daily.  . [DISCONTINUED] insulin aspart (NOVOLOG) 100 UNIT/ML injection Inject 5 Units into the skin 2 (two) times daily. If blood sugar is >150  . [DISCONTINUED] linagliptin (TRADJENTA) 5 MG TABS tablet Take 5 mg by mouth every evening.    No facility-administered encounter medications on file as of 08/20/2018.     Review of Systems   General is not complaining of any fever or chills.  Skin no diaphoresis rashes or itching.  Head ears eyes nose mouth and throat does not complain of visual changes sore throat or difficulty swallowing.  Respiratory denies being short of breath or having a cough.  Cardiac does not complain of chest pain.  GI as noted above no further nausea and vomiting denies abdominal pain or diarrhea.  GU does not complain of dysuria again he has end-stage renal disease on dialysis.  Musculoskeletal denies joint pain.  Neurologic does not complain of being dizzy or having a headache or syncopal  Psych does he have a history of mild cognitive impairment question of mild mental retardation appears to be  at baseline does not complain of being anxious does not appear depressed  Immunization History  Administered Date(s) Administered  . Influenza,inj,Quad PF,6+ Mos 12/26/2013, 07/09/2015  . Influenza-Unspecified 07/31/2017  . PPD Test 03/07/2015  . Pneumococcal Conjugate-13 07/31/2017  . Pneumococcal Polysaccharide-23 03/04/2015  . Tdap 08/22/2017   Pertinent  Health Maintenance Due  Topic Date Due  . OPHTHALMOLOGY EXAM  08/21/2018 (Originally 08/23/1956)  . URINE MICROALBUMIN  08/21/2018 (Originally 08/23/1956)  . HEMOGLOBIN A1C  01/22/2019  . FOOT EXAM  05/20/2019  . COLONOSCOPY  04/28/2027  . INFLUENZA VACCINE  Completed  . PNA vac Low Risk Adult  Completed   Fall Risk  02/18/2018  Falls in the past year? No   Functional Status Survey:    Vitals:   08/20/18 1523  BP: 103/64  Pulse: 94  Resp: 20  Temp: 98.3 F (36.8 C)  TempSrc: Oral       Physical Exam   General this is a very pleasant male in no distress sitting comfortably in his wheelchair.  His skin is warm and dry.  Eyes visual acuity appears to be intact sclera and conjunctive are clear.  Oropharynx is clear mucous membranes moist.  Chest is clear to auscultation there is no labored breathing.  Heart is regular rate and rhythm without murmur gallop or rub he has mild baseline lower extremity edema.  His abdomen is soft with active bowel sounds there is no tenderness.  Musculoskeletal continues to ambulate about facility in his wheelchair moves all extremities x4 at baseline  Neurologic is grossly intact no lateralizing findings.  Psych he is oriented to self pleasant appropriate follow simple verbal commands without difficulty-this is baseline for him  Labs reviewed: Recent Labs    01/22/18 0945 04/22/18 0718 04/26/18 1236 07/24/18 0737 08/13/18 0935  NA 137 137 137 136 139  K 3.4* 4.3 4.8 4.7 5.2*  CL 94* 101 102 99 103  CO2 21* 25 21* 26  --   GLUCOSE 143* 271* 165* 215* 176*  BUN 61*  51* 62* 29* 57*  CREATININE 10.63* 9.14* 11.66* 8.42* 12.10*  CALCIUM 8.2* 8.9 8.6* 8.9  --   PHOS 7.9*  --   --   --   --    Recent Labs    01/19/18 1608 01/22/18 0945  AST 22  --   ALT 18  --   ALKPHOS 152*  --   BILITOT 0.8  --   PROT 6.8  --   ALBUMIN 3.3* 3.2*   Recent Labs    01/19/18 0700 01/19/18 1608  04/22/18 0718 04/26/18 1236 07/24/18 0737 08/13/18 0935  WBC 8.9 7.4   < > 6.6 8.7 5.4  --   NEUTROABS 6.8 5.3  --  3.5  --   --   --   HGB 11.6* 11.0*   < > 11.7* 11.0* 11.4* 12.9*  HCT 36.3* 34.8*   < > 36.0* 33.7* 34.6* 38.0*  MCV 106.1* 106.4*   < > 105.6* 105.0* 107.1*  --   PLT 145* 143*   < > 142* 125* 128*  --    < > = values in this interval not displayed.   Lab Results  Component Value Date   TSH 3.556 05/25/2018   Lab Results  Component Value Date   HGBA1C 8.5 (H) 07/24/2018   Lab Results  Component Value Date   CHOL 80 07/03/2018   HDL 19 (L) 07/03/2018   LDLCALC UNABLE TO CALCULATE IF TRIGLYCERIDE OVER 400 mg/dL 07/03/2018   TRIG 481 (H) 07/03/2018   CHOLHDL 4.2 07/03/2018    Significant Diagnostic Results in last 30 days:  No results found.  Assessment/Plan  #1 history of nausea and vomiting-this appears to have resolved he is back at his baseline suspect this may have been a passing virus- despite only now being on the reduced dose of Lantus his blood sugars appear to be stable 173 this morning-199 at noon- at this point will monitor suspect as his appetite improves we will have to increase his Lantus as well as restart Tradjenta and the NovoLog twice a day but will watch it for now since appears to be stable and would like to avoid hypoglycemia   #2-type 2 diabetes as noted above.  [id number]

## 2018-08-21 ENCOUNTER — Encounter: Payer: Self-pay | Admitting: Internal Medicine

## 2018-08-21 DIAGNOSIS — D631 Anemia in chronic kidney disease: Secondary | ICD-10-CM | POA: Diagnosis not present

## 2018-08-21 DIAGNOSIS — Z992 Dependence on renal dialysis: Secondary | ICD-10-CM | POA: Diagnosis not present

## 2018-08-21 DIAGNOSIS — N2581 Secondary hyperparathyroidism of renal origin: Secondary | ICD-10-CM | POA: Diagnosis not present

## 2018-08-21 DIAGNOSIS — N186 End stage renal disease: Secondary | ICD-10-CM | POA: Diagnosis not present

## 2018-08-21 DIAGNOSIS — D509 Iron deficiency anemia, unspecified: Secondary | ICD-10-CM | POA: Diagnosis not present

## 2018-08-23 DIAGNOSIS — E113293 Type 2 diabetes mellitus with mild nonproliferative diabetic retinopathy without macular edema, bilateral: Secondary | ICD-10-CM | POA: Diagnosis not present

## 2018-08-23 DIAGNOSIS — Z7984 Long term (current) use of oral hypoglycemic drugs: Secondary | ICD-10-CM | POA: Diagnosis not present

## 2018-08-23 DIAGNOSIS — H2513 Age-related nuclear cataract, bilateral: Secondary | ICD-10-CM | POA: Diagnosis not present

## 2018-08-23 DIAGNOSIS — Z794 Long term (current) use of insulin: Secondary | ICD-10-CM | POA: Diagnosis not present

## 2018-08-24 DIAGNOSIS — D509 Iron deficiency anemia, unspecified: Secondary | ICD-10-CM | POA: Diagnosis not present

## 2018-08-24 DIAGNOSIS — N186 End stage renal disease: Secondary | ICD-10-CM | POA: Diagnosis not present

## 2018-08-24 DIAGNOSIS — D631 Anemia in chronic kidney disease: Secondary | ICD-10-CM | POA: Diagnosis not present

## 2018-08-24 DIAGNOSIS — Z992 Dependence on renal dialysis: Secondary | ICD-10-CM | POA: Diagnosis not present

## 2018-08-24 DIAGNOSIS — N2581 Secondary hyperparathyroidism of renal origin: Secondary | ICD-10-CM | POA: Diagnosis not present

## 2018-08-26 DIAGNOSIS — Z992 Dependence on renal dialysis: Secondary | ICD-10-CM | POA: Diagnosis not present

## 2018-08-26 DIAGNOSIS — D631 Anemia in chronic kidney disease: Secondary | ICD-10-CM | POA: Diagnosis not present

## 2018-08-26 DIAGNOSIS — D509 Iron deficiency anemia, unspecified: Secondary | ICD-10-CM | POA: Diagnosis not present

## 2018-08-26 DIAGNOSIS — N186 End stage renal disease: Secondary | ICD-10-CM | POA: Diagnosis not present

## 2018-08-26 DIAGNOSIS — N2581 Secondary hyperparathyroidism of renal origin: Secondary | ICD-10-CM | POA: Diagnosis not present

## 2018-08-28 DIAGNOSIS — N186 End stage renal disease: Secondary | ICD-10-CM | POA: Diagnosis not present

## 2018-08-28 DIAGNOSIS — D631 Anemia in chronic kidney disease: Secondary | ICD-10-CM | POA: Diagnosis not present

## 2018-08-28 DIAGNOSIS — D509 Iron deficiency anemia, unspecified: Secondary | ICD-10-CM | POA: Diagnosis not present

## 2018-08-28 DIAGNOSIS — N2581 Secondary hyperparathyroidism of renal origin: Secondary | ICD-10-CM | POA: Diagnosis not present

## 2018-08-28 DIAGNOSIS — Z992 Dependence on renal dialysis: Secondary | ICD-10-CM | POA: Diagnosis not present

## 2018-08-31 DIAGNOSIS — Z992 Dependence on renal dialysis: Secondary | ICD-10-CM | POA: Diagnosis not present

## 2018-08-31 DIAGNOSIS — D631 Anemia in chronic kidney disease: Secondary | ICD-10-CM | POA: Diagnosis not present

## 2018-08-31 DIAGNOSIS — N2581 Secondary hyperparathyroidism of renal origin: Secondary | ICD-10-CM | POA: Diagnosis not present

## 2018-08-31 DIAGNOSIS — N186 End stage renal disease: Secondary | ICD-10-CM | POA: Diagnosis not present

## 2018-08-31 DIAGNOSIS — D509 Iron deficiency anemia, unspecified: Secondary | ICD-10-CM | POA: Diagnosis not present

## 2018-09-02 DIAGNOSIS — Z992 Dependence on renal dialysis: Secondary | ICD-10-CM | POA: Diagnosis not present

## 2018-09-02 DIAGNOSIS — D509 Iron deficiency anemia, unspecified: Secondary | ICD-10-CM | POA: Diagnosis not present

## 2018-09-02 DIAGNOSIS — D631 Anemia in chronic kidney disease: Secondary | ICD-10-CM | POA: Diagnosis not present

## 2018-09-02 DIAGNOSIS — N186 End stage renal disease: Secondary | ICD-10-CM | POA: Diagnosis not present

## 2018-09-02 DIAGNOSIS — N2581 Secondary hyperparathyroidism of renal origin: Secondary | ICD-10-CM | POA: Diagnosis not present

## 2018-09-04 DIAGNOSIS — N2581 Secondary hyperparathyroidism of renal origin: Secondary | ICD-10-CM | POA: Diagnosis not present

## 2018-09-04 DIAGNOSIS — N186 End stage renal disease: Secondary | ICD-10-CM | POA: Diagnosis not present

## 2018-09-04 DIAGNOSIS — D631 Anemia in chronic kidney disease: Secondary | ICD-10-CM | POA: Diagnosis not present

## 2018-09-04 DIAGNOSIS — D509 Iron deficiency anemia, unspecified: Secondary | ICD-10-CM | POA: Diagnosis not present

## 2018-09-04 DIAGNOSIS — Z992 Dependence on renal dialysis: Secondary | ICD-10-CM | POA: Diagnosis not present

## 2018-09-07 ENCOUNTER — Ambulatory Visit (HOSPITAL_COMMUNITY)
Admit: 2018-09-07 | Discharge: 2018-09-07 | Disposition: A | Discharge status: Home / Self Care | Payer: Medicare Other | Source: Ambulatory Visit | Attending: Medical | Admitting: Medical

## 2018-09-07 ENCOUNTER — Encounter (HOSPITAL_COMMUNITY): Payer: Self-pay | Admitting: Diagnostic Radiology

## 2018-09-07 ENCOUNTER — Other Ambulatory Visit: Payer: Self-pay | Admitting: Radiology

## 2018-09-07 DIAGNOSIS — F79 Unspecified intellectual disabilities: Secondary | ICD-10-CM | POA: Insufficient documentation

## 2018-09-07 DIAGNOSIS — Y832 Surgical operation with anastomosis, bypass or graft as the cause of abnormal reaction of the patient, or of later complication, without mention of misadventure at the time of the procedure: Secondary | ICD-10-CM | POA: Diagnosis not present

## 2018-09-07 DIAGNOSIS — E785 Hyperlipidemia, unspecified: Secondary | ICD-10-CM | POA: Insufficient documentation

## 2018-09-07 DIAGNOSIS — N186 End stage renal disease: Secondary | ICD-10-CM | POA: Diagnosis not present

## 2018-09-07 DIAGNOSIS — E1122 Type 2 diabetes mellitus with diabetic chronic kidney disease: Secondary | ICD-10-CM | POA: Insufficient documentation

## 2018-09-07 DIAGNOSIS — N2581 Secondary hyperparathyroidism of renal origin: Secondary | ICD-10-CM | POA: Diagnosis not present

## 2018-09-07 DIAGNOSIS — N400 Enlarged prostate without lower urinary tract symptoms: Secondary | ICD-10-CM | POA: Insufficient documentation

## 2018-09-07 DIAGNOSIS — F419 Anxiety disorder, unspecified: Secondary | ICD-10-CM | POA: Insufficient documentation

## 2018-09-07 DIAGNOSIS — Z794 Long term (current) use of insulin: Secondary | ICD-10-CM | POA: Insufficient documentation

## 2018-09-07 DIAGNOSIS — I120 Hypertensive chronic kidney disease with stage 5 chronic kidney disease or end stage renal disease: Secondary | ICD-10-CM | POA: Diagnosis not present

## 2018-09-07 DIAGNOSIS — K219 Gastro-esophageal reflux disease without esophagitis: Secondary | ICD-10-CM | POA: Diagnosis not present

## 2018-09-07 DIAGNOSIS — E039 Hypothyroidism, unspecified: Secondary | ICD-10-CM | POA: Diagnosis not present

## 2018-09-07 DIAGNOSIS — T82858A Stenosis of vascular prosthetic devices, implants and grafts, initial encounter: Secondary | ICD-10-CM | POA: Diagnosis not present

## 2018-09-07 DIAGNOSIS — Z992 Dependence on renal dialysis: Secondary | ICD-10-CM | POA: Diagnosis not present

## 2018-09-07 DIAGNOSIS — D509 Iron deficiency anemia, unspecified: Secondary | ICD-10-CM | POA: Diagnosis not present

## 2018-09-07 DIAGNOSIS — T8249XA Other complication of vascular dialysis catheter, initial encounter: Secondary | ICD-10-CM | POA: Diagnosis not present

## 2018-09-07 DIAGNOSIS — T82868A Thrombosis of vascular prosthetic devices, implants and grafts, initial encounter: Secondary | ICD-10-CM | POA: Diagnosis not present

## 2018-09-07 DIAGNOSIS — D631 Anemia in chronic kidney disease: Secondary | ICD-10-CM | POA: Diagnosis not present

## 2018-09-07 HISTORY — PX: IR AV DIALY SHUNT INTRO NEEDLE/INTRACATH INITIAL W/PTA/IMG RIGHT: IMG6116

## 2018-09-07 HISTORY — PX: IR US GUIDE VASC ACCESS RIGHT: IMG2390

## 2018-09-07 LAB — POCT I-STAT, CHEM 8
BUN: 39 mg/dL — ABNORMAL HIGH (ref 8–23)
Calcium, Ion: 1.17 mmol/L (ref 1.15–1.40)
Chloride: 102 mmol/L (ref 98–111)
Creatinine, Ser: 9.8 mg/dL — ABNORMAL HIGH (ref 0.61–1.24)
Glucose, Bld: 210 mg/dL — ABNORMAL HIGH (ref 70–99)
HCT: 42 % (ref 39.0–52.0)
Hemoglobin: 14.3 g/dL (ref 13.0–17.0)
Potassium: 4.6 mmol/L (ref 3.5–5.1)
Sodium: 138 mmol/L (ref 135–145)
TCO2: 28 mmol/L (ref 22–32)

## 2018-09-07 LAB — GLUCOSE, CAPILLARY: Glucose-Capillary: 181 mg/dL — ABNORMAL HIGH (ref 70–99)

## 2018-09-07 MED ORDER — FENTANYL CITRATE (PF) 100 MCG/2ML IJ SOLN
INTRAMUSCULAR | Status: AC
  Filled 2018-09-07: qty 2

## 2018-09-07 MED ORDER — LIDOCAINE HCL 1 % IJ SOLN
INTRAMUSCULAR | Status: AC
  Filled 2018-09-07: qty 20

## 2018-09-07 MED ORDER — LIDOCAINE HCL 1 % IJ SOLN
INTRAMUSCULAR | Status: AC | PRN
  Administered 2018-09-07: 5 mL

## 2018-09-07 MED ORDER — IOPAMIDOL (ISOVUE-300) INJECTION 61%
INTRAVENOUS | Status: AC
  Administered 2018-09-07: 60 mL
  Filled 2018-09-07: qty 100

## 2018-09-07 MED ORDER — MIDAZOLAM HCL 2 MG/2ML IJ SOLN
INTRAMUSCULAR | Status: AC
  Filled 2018-09-07: qty 2

## 2018-09-07 MED ORDER — ALTEPLASE 2 MG IJ SOLR
INTRAMUSCULAR | Status: AC
  Filled 2018-09-07: qty 2

## 2018-09-07 MED ORDER — FENTANYL CITRATE (PF) 100 MCG/2ML IJ SOLN
INTRAMUSCULAR | Status: AC | PRN
  Administered 2018-09-07 (×3): 50 ug via INTRAVENOUS

## 2018-09-07 MED ORDER — MIDAZOLAM HCL 2 MG/2ML IJ SOLN
INTRAMUSCULAR | Status: AC | PRN
  Administered 2018-09-07 (×2): 1 mg via INTRAVENOUS

## 2018-09-07 NOTE — Discharge Instructions (Signed)

## 2018-09-07 NOTE — H&P (Signed)
Chief Complaint: Patient was seen in consultation today for ESRD and fistula malfunction.  Referring Physician(s): Lyles,Charles  Supervising Physician: Markus Daft  Patient Status: [facility] - Out-pt  History of Present Illness: Zachary [name] is a 72 y.o. male with a past medical history of hypertension, hyperlipidemia, GERD, ESRD on HD, hydronephrosis, pyelonephritis, cystitis, BPH, diabetes mellitus, protein calorie malnutrition, hypothyroidism, anemia, anxiety, and tobacco abuse. He receives dialysis via RUE AVF on Tuesdays, Thursdays, and Saturdays. He underwent an image-guided fistulagram with angioplasty and thrombolysis 04/26/2018 by Dr. Pascal Lux. He has had no problems with his AVF until today at dialysis. RN reports pulling clots out of his fistula today.  IR requested by Ramiro Harvest, PA-C for possible image-guided fistulagram and possible intervention. Patient awake and alert laying in bed with no complaints at this time. RUE at location of AVF c/d/i. Denies fever, chills, chest pain, dyspnea, abdominal pain, dizziness, or headache.   Past Medical History:  Diagnosis Date  . Abnormal CT scan, kidney 10/06/2011  . Acute pyelonephritis 10/07/2011  . Anemia    normocytic  . Anxiety    mental retardation  . Bladder wall thickening 10/06/2011  . BPH (benign prostatic hypertrophy)   . Diabetes mellitus   . Dialysis patient [facility])    Tuesday, Thursday and Saturday,   . Edema     history of lower extremity edema  . GERD (gastroesophageal reflux disease)   . Heme positive stool   . Hydronephrosis   . Hyperkalemia   . Hyperlipidemia   . Hypernatremia   . Hypertension   . Hypothyroidism   . Impaired speech   . 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 MR (mental retardation)   . Muscle weakness   . Obstructive uropathy   . 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 Poor historian poor historian  . Protein calorie malnutrition ([place])   . Pyelonephritis   . Renal failure (ARF),  acute on chronic (HCC)   . Renal insufficiency    chronic history  . Sepsis ([place])   . Smoking   . Uremia   . Urinary retention   . UTI (lower urinary tract infection) 10/06/2011    Past Surgical History:  Procedure Laterality Date  . A/V FISTULAGRAM N/A 08/13/2018   Procedure: A/V FISTULAGRAM - Right Upper;  Surgeon: Elam Dutch, MD;  Location: [place] [facility];  Service: Cardiovascular;  Laterality: N/A;  . AV FISTULA PLACEMENT Left 07/06/2015   Procedure:  INSERTION LEFT ARM ARTERIOVENOUS GORTEX GRAFT;  Surgeon: Angelia Mould, MD;  Location: [place];  Service: Vascular;  Laterality: Left;  . AV FISTULA PLACEMENT Right 02/26/2016   Procedure: ARTERIOVENOUS (AV) FISTULA CREATION ;  Surgeon: Angelia Mould, MD;  Location: [place];  Service: Vascular;  Laterality: Right;  . [place] REMOVAL Left 10/09/2015   Procedure: REMOVAL OF ARTERIOVENOUS GORETEX GRAFT ([place]) Evacuation of Lymphocele, Vein Patch angioplasty of brachial artery.;  Surgeon: Angelia Mould, MD;  Location: [place];  Service: Vascular;  Laterality: Left;  . BASCILIC VEIN TRANSPOSITION Right 02/26/2016   Procedure: Right BASCILIC VEIN TRANSPOSITION;  Surgeon: Angelia Mould, MD;  Location: [place];  Service: Vascular;  Laterality: Right;  . CIRCUMCISION N/A 01/04/2014   Procedure: CIRCUMCISION ADULT (procedure #1);  Surgeon: Marissa Nestle, MD;  Location: [facility];  Service: Urology;  Laterality: N/A;  . COLECTOMY N/A 05/04/2017   Procedure: TOTAL COLECTOMY;  Surgeon: Aviva Signs, MD;  Location: [facility];  Service: General;  Laterality: N/A;  .  COLONOSCOPY N/A 04/27/2017   Procedure: COLONOSCOPY;  Surgeon: Daneil Dolin, MD;  [facility];  Service: Endoscopy;  Laterality: N/A;  245  . CYSTOSCOPY W/ RETROGRADES Bilateral 06/29/2015   Procedure: CYSTOSCOPY, DILATION OF URETHRAL STRICTURE WITH BILATERAL RETROGRADE PYELOGRAM,SUPRAPUBIC TUBE CHANGE;  Surgeon: Festus Aloe, MD;  Location: [facility];   Service: Urology;  Laterality: Bilateral;  . CYSTOSCOPY WITH URETHRAL DILATATION N/A 12/29/2013   Procedure: CYSTOSCOPY WITH URETHRAL DILATATION;  Surgeon: Marissa Nestle, MD;  [facility];  Service: Urology;  Laterality: N/A;  . ESOPHAGOGASTRODUODENOSCOPY N/A 04/27/2017   Procedure: ESOPHAGOGASTRODUODENOSCOPY (EGD);  Surgeon: Daneil Dolin, MD;  [facility];  Service: Endoscopy;  Laterality: N/A;  . IR THROMBECTOMY AV FISTULA W/THROMBOLYSIS/PTA INC/SHUNT/IMG RIGHT Right 04/26/2018  . IR US GUIDE VASC ACCESS RIGHT  04/26/2018  . ORIF FEMUR FRACTURE Right 11/22/2016   Procedure: OPEN REDUCTION INTERNAL FIXATION (ORIF) DISTAL FEMUR FRACTURE;  Surgeon: Rod Can, MD;  Location: [place];  Service: Orthopedics;  Laterality: Right;  . PATCH ANGIOPLASTY Right 12/10/2017   Procedure: PATCH ANGIOPLASTY;  Surgeon: Angelia Mould, MD;  Location[facility];  Service: Vascular;  Laterality: Right;  . PERIPHERAL VASCULAR BALLOON ANGIOPLASTY  08/13/2018   Procedure: PERIPHERAL VASCULAR BALLOON ANGIOPLASTY;  Surgeon: Elam Dutch, MD;  Location: [place] [facility];  Service: Cardiovascular;;  right AV fistula   . PERIPHERAL VASCULAR CATHETERIZATION N/A 10/08/2015   Procedure: A/V Shuntogram;  Surgeon: Angelia Mould, MD;  Location: [place] [facility];  Service: Cardiovascular;  Laterality: N/A;  . THROMBECTOMY W/ EMBOLECTOMY Right 12/10/2017   Procedure: THROMBECTOMY REVISION RIGHT ARM  ARTERIOVENOUS FISTULA;  Surgeon: Angelia Mould, MD;  Location: [place];  Service: Vascular;  Laterality: Right;  . TRANSURETHRAL RESECTION OF PROSTATE N/A 01/04/2014   Procedure: TRANSURETHRAL RESECTION OF THE PROSTATE (TURP) (procedure #2);  Surgeon: Marissa Nestle, MD;  [facility];  Service: Urology;  Laterality: N/A;    Allergies: Patient has no known allergies.  Medications: Prior to Admission medications   Medication Sig Start Date End Date Taking? Authorizing Provider    atorvastatin (LIPITOR) 80 MG tablet Take 80 mg by mouth every evening.   Yes [provider]  Insulin Glargine (BASAGLAR KWIKPEN) 100 UNIT/ML SOPN Inject 9 Units into the skin at bedtime.    Yes [provider]  levothyroxine (SYNTHROID, LEVOTHROID) 88 MCG tablet Take 88 mcg by mouth daily before breakfast.   Yes [provider]  midodrine (PROAMATINE) 5 MG tablet Take 5 mg by mouth 3 (three) times a week. Take 5 mg on Tues, Thurs, and Sat at dialysis   Yes [provider]  Multiple Vitamin (MULTIVITAMIN WITH MINERALS) TABS tablet Take 1 tablet by mouth every evening.    Yes [provider]  omeprazole (PRILOSEC) 40 MG capsule Take 40 mg by mouth at bedtime.    Yes [provider]  ondansetron (ZOFRAN) 4 MG tablet Take 4 mg by mouth every 8 (eight) hours as needed for nausea or vomiting.   Yes [provider]  sevelamer carbonate (RENVELA) 800 MG tablet Take 1,600-3,200 mg by mouth See admin instructions. Take 4 tablets (3200mg ) by mouth 3 times daily with meals, and 2 tablets (1600mg ) by mouth with bedtime snack   Yes [provider]  tamsulosin (FLOMAX) 0.4 MG CAPS capsule Take 0.4 mg by mouth every evening. Give 30 minutes after a meal   Yes [provider]  torsemide (DEMADEX) 10 MG tablet  Take 5 tablets (50 mg total) by mouth daily. 04/07/16  Yes [name], MD  Nutritional Supplements (NEPRO/CARBSTEADY PO) Take 1 Can by mouth 2 (two) times daily.    [provider]  polyethylene glycol (MIRALAX / GLYCOLAX) packet Take 17 g by mouth as needed for mild constipation.     [provider]     Family History  Problem Relation Age of Onset  . Cancer Mother   . Colon cancer Neg Hx     Social History   Socioeconomic History  . Marital status: Single    Spouse name: Not on file  . Number of children: Not on file  . Years of education: Not on file  . Highest education level: Not on file   Occupational History  . Not on file  Social Needs  . Financial resource strain: Not hard at all  . Food insecurity:    Worry: Never true    Inability: Never true  . Transportation needs:    Medical: No    Non-medical: No  Tobacco Use  . Smoking status: Never Smoker  . Smokeless tobacco: Never Used  Substance and Sexual Activity  . Alcohol use: No  . Drug use: No  . Sexual activity: Never  Lifestyle  . Physical activity:    Days per week: 0 days    Minutes per session: Not on file  . Stress: Not at all  Relationships  . Social connections:    Talks on phone: More than three times a week    Gets together: More than three times a week    Attends religious service: Never    Active member of club or organization: No    Attends meetings of clubs or organizations: Never    Relationship status: Never married  Other Topics Concern  . Not on file  Social History Narrative   Lives at nursing home.     Review of Systems: A 12 point ROS discussed and pertinent positives are indicated in the HPI above.  All other systems are negative.  Review of Systems  Constitutional: Negative for chills and fever.  Respiratory: Negative for shortness of breath and wheezing.   Cardiovascular: Negative for chest pain and palpitations.  Gastrointestinal: Negative for abdominal pain.  Neurological: Negative for dizziness and headaches.  Psychiatric/Behavioral: Negative for behavioral problems and confusion.    Vital Signs: There were no vitals taken for this visit.  Physical Exam  Constitutional: He is oriented to person, place, and time. He appears well-developed and well-nourished. No distress.  Cardiovascular: Normal rate, regular rhythm and normal heart sounds.  No murmur heard. RUE at site of AVF without erythema, drainage, or tenderness; minimal palpable thrill and soft bruit heard on ascultation.  Pulmonary/Chest: Effort normal and breath sounds normal. No respiratory distress. He  has no wheezes.  Neurological: He is alert and oriented to person, place, and time.  Skin: Skin is warm and dry.  Psychiatric: He has a normal mood and affect. His behavior is normal. Judgment and thought content normal.  Nursing note and vitals reviewed.    MD Evaluation Airway: WNL Heart: WNL Abdomen: WNL Chest/ Lungs: WNL ASA  Classification: 3 Mallampati/Airway Score: Two   Imaging: No results found.  Labs:  CBC: Recent Labs    01/22/18 0945 04/22/18 0718 04/26/18 1236 07/24/18 0737 08/13/18 0935  WBC 6.2 6.6 8.7 5.4  --   HGB 10.9* 11.7* 11.0* 11.4* 12.9*  HCT 34.3* 36.0* 33.7* 34.6* 38.0*  PLT 153  142* 125* 128*  --     COAGS: Recent Labs    04/26/18 1236  INR 1.04    BMP: Recent Labs    01/22/18 0945 04/22/18 0718 04/26/18 1236 07/24/18 0737 08/13/18 0935  NA 137 137 137 136 139  K 3.4* 4.3 4.8 4.7 5.2*  CL 94* 101 102 99 103  CO2 21* 25 21* 26  --   GLUCOSE 143* 271* 165* 215* 176*  BUN 61* 51* 62* 29* 57*  CALCIUM 8.2* 8.9 8.6* 8.9  --   CREATININE 10.63* 9.14* 11.66* 8.42* 12.10*  GFRNONAA 4* 5* 4* 6*  --   GFRAA 5* 6* 4* 7*  --     LIVER FUNCTION TESTS: Recent Labs    01/19/18 1608 01/22/18 0945  BILITOT 0.8  --   AST 22  --   ALT 18  --   ALKPHOS 152*  --   PROT 6.8  --   ALBUMIN 3.3* 3.2*    TUMOR MARKERS: No results for input(s): AFPTM, CEA, CA199, CHROMGRNA in the last 8760 hours.  Assessment and Plan:  ESRD. Fistula malfunction. Plan for image-guided fistulagram with possible angioplasty/stent placement/thrombolysis with possible tunneled hemodialysis catheter placement today with Dr. Anselm Pancoast. Patient is NPO. States he last ate last night. RN reports he last ate at breakfast. Afebrile. He does not take blood thinners. INR pending.  Risks and benefits discussed with the patient including, but not limited to bleeding, infection, vascular injury, pulmonary embolism, need for tunneled HD catheter placement or even  death. All of the patient's questions were answered, patient is agreeable to proceed. Consent signed and in chart.  Risks and benefits discussed with the patient including, but not limited to bleeding, infection, vascular injury, pneumothorax which may require chest tube placement, air embolism or even death. All of the patient's questions were answered, patient is agreeable to proceed. Consent signed and in chart.   Thank you for this interesting consult.  I greatly enjoyed meeting Zachary [name] and look forward to participating in their care.  A copy of this report was sent to the requesting provider on this date.  Electronically Signed: Earley Abide, PA-C 09/07/2018, 1:52 PM   I spent a total of 25 Minutes in face to face in clinical consultation, greater than 50% of which was counseling/coordinating care for ESRD AND fistula malfunction.

## 2018-09-07 NOTE — Procedures (Signed)
  Pre-operative Diagnosis: Right arm fistula with thrombus and stenosis       Post-operative Diagnosis: Right arm fistula with thrombus and stenosis   Indications: Poor flows and difficulty at dialysis  Procedure: Right upper arm fistulogram; PTA of outflow vein and PTA of vein near arterial anastomosis.  Findings: Critical stenoses of vein near anastomosis.  Moderate stenosis of outflow vein in mid humerus.  Improved flow after balloon angioplasty of arterial anastomosis area with 4 mm balloon and angioplasty of mid humeral region with 6 mm balloon  Complications: None     EBL: Minimal  Plan: Fistula is ready to use.   Remove retention sutures tomorrow.

## 2018-09-09 DIAGNOSIS — D509 Iron deficiency anemia, unspecified: Secondary | ICD-10-CM | POA: Diagnosis not present

## 2018-09-09 DIAGNOSIS — N2581 Secondary hyperparathyroidism of renal origin: Secondary | ICD-10-CM | POA: Diagnosis not present

## 2018-09-09 DIAGNOSIS — N186 End stage renal disease: Secondary | ICD-10-CM | POA: Diagnosis not present

## 2018-09-09 DIAGNOSIS — D631 Anemia in chronic kidney disease: Secondary | ICD-10-CM | POA: Diagnosis not present

## 2018-09-09 DIAGNOSIS — Z992 Dependence on renal dialysis: Secondary | ICD-10-CM | POA: Diagnosis not present

## 2018-09-11 DIAGNOSIS — Z992 Dependence on renal dialysis: Secondary | ICD-10-CM | POA: Diagnosis not present

## 2018-09-11 DIAGNOSIS — D631 Anemia in chronic kidney disease: Secondary | ICD-10-CM | POA: Diagnosis not present

## 2018-09-11 DIAGNOSIS — N2581 Secondary hyperparathyroidism of renal origin: Secondary | ICD-10-CM | POA: Diagnosis not present

## 2018-09-11 DIAGNOSIS — D509 Iron deficiency anemia, unspecified: Secondary | ICD-10-CM | POA: Diagnosis not present

## 2018-09-11 DIAGNOSIS — N186 End stage renal disease: Secondary | ICD-10-CM | POA: Diagnosis not present

## 2018-09-14 DIAGNOSIS — D631 Anemia in chronic kidney disease: Secondary | ICD-10-CM | POA: Diagnosis not present

## 2018-09-14 DIAGNOSIS — Z992 Dependence on renal dialysis: Secondary | ICD-10-CM | POA: Diagnosis not present

## 2018-09-14 DIAGNOSIS — D509 Iron deficiency anemia, unspecified: Secondary | ICD-10-CM | POA: Diagnosis not present

## 2018-09-14 DIAGNOSIS — N186 End stage renal disease: Secondary | ICD-10-CM | POA: Diagnosis not present

## 2018-09-14 DIAGNOSIS — N2581 Secondary hyperparathyroidism of renal origin: Secondary | ICD-10-CM | POA: Diagnosis not present

## 2018-09-16 DIAGNOSIS — D509 Iron deficiency anemia, unspecified: Secondary | ICD-10-CM | POA: Diagnosis not present

## 2018-09-16 DIAGNOSIS — N186 End stage renal disease: Secondary | ICD-10-CM | POA: Diagnosis not present

## 2018-09-16 DIAGNOSIS — Z992 Dependence on renal dialysis: Secondary | ICD-10-CM | POA: Diagnosis not present

## 2018-09-16 DIAGNOSIS — N2581 Secondary hyperparathyroidism of renal origin: Secondary | ICD-10-CM | POA: Diagnosis not present

## 2018-09-16 DIAGNOSIS — D631 Anemia in chronic kidney disease: Secondary | ICD-10-CM | POA: Diagnosis not present

## 2018-09-17 ENCOUNTER — Non-Acute Institutional Stay (SKILLED_NURSING_FACILITY): Payer: Medicare Other | Admitting: Internal Medicine

## 2018-09-17 ENCOUNTER — Encounter: Payer: Self-pay | Admitting: Internal Medicine

## 2018-09-17 DIAGNOSIS — Z794 Long term (current) use of insulin: Secondary | ICD-10-CM

## 2018-09-17 DIAGNOSIS — N186 End stage renal disease: Secondary | ICD-10-CM

## 2018-09-17 DIAGNOSIS — E1122 Type 2 diabetes mellitus with diabetic chronic kidney disease: Secondary | ICD-10-CM | POA: Diagnosis not present

## 2018-09-17 DIAGNOSIS — Z992 Dependence on renal dialysis: Secondary | ICD-10-CM | POA: Diagnosis not present

## 2018-09-17 NOTE — Progress Notes (Signed)
Location:    [facility] Room Number: 112/D Place of Service:  SNF (31) Provider:  Glennon Hamilton, MD  Patient Care Team: [name], MD as PCP - General (Internal Medicine) Cassandria Anger, MD as Consulting Physician (Endocrinology) Fran Lowes, MD as Consulting Physician (Nephrology) Gala Romney Cristopher Estimable, MD as Consulting Physician (Gastroenterology)  Extended Emergency Contact Information Primary Emergency Contact: Betsey Amen Address: DSS  [place] Phone: [phone] Work Phone: [phone] Relation: Legal Guardian Secondary Emergency Contact: Antuna,Richard Address: [address]          [place],  [address] [place] Phone: [phone] Relation: Brother  Code Status:  Full Code Goals of care: Advanced Directive information Advanced Directives 09/17/2018  Does Patient Have a Medical Advance Directive? Yes  Type of Advance Directive (No Data)  Does patient want to make changes to medical advance directive? No - Patient declined  Copy of [place] in Chart? No - copy requested  Would patient like information on creating a medical advance directive? No - Patient declined  Pre-existing out of facility DNR order (yellow form or pink MOST form) -     Chief complaint-acute visit follow-up elevated blood sugars with history of type 2 diabetes   HPI:  Pt is a 72 y.o. male seen today for an acute visit for elevated blood sugars.  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  However patient developed  some nausea and vomiting at one point approximately a month ago in the Basaglar insulin was reduced down to 9 units secondary to concerns of risk of hypoglycemia.  However this has resolved he continues to have a vigorous appetite and his blood sugars correspondingly have been somewhat elevated and appears fairly consistently above 200 throughout the day although at times midday he will have blood sugars more in the mid 100s occasionally.  He is no longer on bolus insulin or orders to notify provider if CBG is greater than 250 and he did receive 2 units earlier today for blood sugar in the 270s.  His hemoglobin A1c in September was 8.5--and at that point appears his insulin dose was increased   Clinically appears to be baseline continues to ambulate up about facility in his wheelchair and appears to be doing quite well with supportive care he is tolerating his dialysis well.    Past Medical History:  Diagnosis Date  . Abnormal CT scan, kidney 10/06/2011  . Acute pyelonephritis 10/07/2011  . Anemia    normocytic  . Anxiety    mental retardation  . Bladder wall thickening 10/06/2011  . BPH (benign prostatic hypertrophy)   . Diabetes mellitus   . Dialysis patient [facility])    Tuesday, Thursday and Saturday,   . Edema     history of lower extremity edema  . GERD (gastroesophageal reflux disease)   . Heme positive stool   . Hydronephrosis   . Hyperkalemia   . Hyperlipidemia   . Hypernatremia   . Hypertension   . Hypothyroidism   . Impaired speech   . 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 MR (mental retardation)   .  Muscle weakness   . Obstructive uropathy   . 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 Poor historian poor historian  . Protein calorie malnutrition ([place])   . Pyelonephritis   . Renal failure (ARF), acute on chronic (HCC)   . Renal insufficiency    chronic history  . Sepsis ([place])   . Smoking   . Uremia   . Urinary retention   . UTI (lower urinary tract infection)  10/06/2011   Past Surgical History:  Procedure Laterality Date  . A/V FISTULAGRAM N/A 08/13/2018   Procedure: A/V FISTULAGRAM - Right Upper;  Surgeon: Elam Dutch, MD;  Location: [facility] CV LAB;  Service: Cardiovascular;  Laterality: N/A;  . AV FISTULA PLACEMENT Left 07/06/2015   Procedure:  INSERTION LEFT ARM ARTERIOVENOUS GORTEX GRAFT;  Surgeon: Angelia Mould, MD;  Location: [facility];  Service: Vascular;  Laterality: Left;  . AV FISTULA PLACEMENT Right 02/26/2016   Procedure: ARTERIOVENOUS (AV) FISTULA CREATION ;  Surgeon: Angelia Mould, MD;  Location: [place];  Service: Vascular;  Laterality: Right;  . [place] REMOVAL Left 10/09/2015   Procedure: REMOVAL OF ARTERIOVENOUS GORETEX GRAFT ([place]) Evacuation of Lymphocele, Vein Patch angioplasty of brachial artery.;  Surgeon: Angelia Mould, MD;  Location: [facility];  Service: Vascular;  Laterality: Left;  . BASCILIC VEIN TRANSPOSITION Right 02/26/2016   Procedure: Right BASCILIC VEIN TRANSPOSITION;  Surgeon: Angelia Mould, MD;  Location: [place];  Service: Vascular;  Laterality: Right;  . CIRCUMCISION N/A 01/04/2014   Procedure: CIRCUMCISION ADULT (procedure #1);  Surgeon: Marissa Nestle, MD;  Location: [facility];  Service: Urology;  Laterality: N/A;  . COLECTOMY N/A 05/04/2017   Procedure: TOTAL COLECTOMY;  Surgeon: Aviva Signs, MD;  Location: [facility];  Service: General;  Laterality: N/A;  . COLONOSCOPY N/A 04/27/2017   Procedure: COLONOSCOPY;  Surgeon: Daneil Dolin, MD;  [facility];  Service: Endoscopy;  Laterality: N/A;  245  . CYSTOSCOPY W/ RETROGRADES Bilateral 06/29/2015   Procedure: CYSTOSCOPY, DILATION OF URETHRAL STRICTURE WITH BILATERAL RETROGRADE PYELOGRAM,SUPRAPUBIC TUBE CHANGE;  Surgeon: Festus Aloe, MD;  Location: [facility];  Service: Urology;  Laterality: Bilateral;  . CYSTOSCOPY WITH URETHRAL DILATATION N/A 12/29/2013   Procedure: CYSTOSCOPY WITH URETHRAL DILATATION;  Surgeon: Marissa Nestle, MD;   Location: [facility];  Service: Urology;  Laterality: N/A;  . ESOPHAGOGASTRODUODENOSCOPY N/A 04/27/2017   Procedure: ESOPHAGOGASTRODUODENOSCOPY (EGD);  Surgeon: Daneil Dolin, MD;  [facility];  Service: Endoscopy;  Laterality: N/A;  . IR AV DIALY SHUNT INTRO NEEDLE/INTRACATH INITIAL W/PTA/IMG RIGHT Right 09/07/2018  . IR THROMBECTOMY AV FISTULA W/THROMBOLYSIS/PTA INC/SHUNT/IMG RIGHT Right 04/26/2018  . IR US GUIDE VASC ACCESS RIGHT  04/26/2018  . IR US GUIDE VASC ACCESS RIGHT  09/07/2018  . ORIF FEMUR FRACTURE Right 11/22/2016   Procedure: OPEN REDUCTION INTERNAL FIXATION (ORIF) DISTAL FEMUR FRACTURE;  Surgeon: Rod Can, MD;  Location: [facility];  Service: Orthopedics;  Laterality: Right;  . PATCH ANGIOPLASTY Right 12/10/2017   Procedure: PATCH ANGIOPLASTY;  Surgeon: Angelia Mould, MD;  Location: [facility] OR;  Service: Vascular;  Laterality: Right;  . PERIPHERAL VASCULAR BALLOON ANGIOPLASTY  08/13/2018   Procedure: PERIPHERAL VASCULAR BALLOON ANGIOPLASTY;  Surgeon: Elam Dutch, MD;  Location: [place] [facility];  Service: Cardiovascular;;  right AV fistula   . PERIPHERAL VASCULAR CATHETERIZATION N/A 10/08/2015   Procedure: A/V Shuntogram;  Surgeon: Angelia Mould, MD;  Location: [place] [facility];  Service: Cardiovascular;  Laterality: N/A;  . THROMBECTOMY W/  EMBOLECTOMY Right 12/10/2017   Procedure: THROMBECTOMY REVISION RIGHT ARM  ARTERIOVENOUS FISTULA;  Surgeon: Angelia Mould, MD;  Location: [place];  Service: Vascular;  Laterality: Right;  . TRANSURETHRAL RESECTION OF PROSTATE N/A 01/04/2014   Procedure: TRANSURETHRAL RESECTION OF THE PROSTATE (TURP) (procedure #2);  Surgeon: Marissa Nestle, MD;  [facility];  Service: Urology;  Laterality: N/A;    No Known Allergies  Outpatient Encounter Medications as of 09/17/2018  Medication Sig  . atorvastatin (LIPITOR) 80 MG tablet Take 80 mg by mouth every evening.  . Insulin Glargine (BASAGLAR KWIKPEN) 100  UNIT/ML SOPN Inject 9 Units into the skin at bedtime.   Marland Kitchen levothyroxine (SYNTHROID, LEVOTHROID) 88 MCG tablet Take 88 mcg by mouth daily before breakfast.  . midodrine (PROAMATINE) 5 MG tablet Take 5 mg by mouth 3 (three) times a week. Take 5 mg on Tues, Thurs, and Sat at dialysis  . Multiple Vitamin (MULTIVITAMIN WITH MINERALS) TABS tablet Take 1 tablet by mouth every evening.   . Nutritional Supplements (NEPRO/CARBSTEADY PO) Take 1 Can by mouth 2 (two) times daily.  Marland Kitchen omeprazole (PRILOSEC) 40 MG capsule Take 40 mg by mouth at bedtime.   . polyethylene glycol (MIRALAX / GLYCOLAX) packet Take 17 g by mouth as needed for mild constipation.   . sevelamer carbonate (RENVELA) 800 MG tablet Take 800 mg by mouth daily.   . sevelamer carbonate (RENVELA) 800 MG tablet Take 1,600 mg by mouth 3 (three) times daily with meals.  . tamsulosin (FLOMAX) 0.4 MG CAPS capsule Take 0.4 mg by mouth every evening. Give 30 minutes after a meal  . torsemide (DEMADEX) 10 MG tablet Take 5 tablets (50 mg total) by mouth daily.  . [DISCONTINUED] ondansetron (ZOFRAN) 4 MG tablet Take 4 mg by mouth every 8 (eight) hours as needed for nausea or vomiting.   No facility-administered encounter medications on file as of 09/17/2018.     Review of Systems  This is somewhat limited since patient is somewhat of a poor historian.  General no complaints of fever chills.  Skin not complain of rashes or itching.  Head ears eyes nose mouth and throat does not complain of any visual changes or sore throat.  Respiratory denies shortness of breath or cough.  Cardiac no complaints of chest pain has some mild baseline lower extremity edema.  GI is not complaining of any abdominal pain no further nausea or vomiting.  Musculoskeletal does not complain of joint pain.  Neurologic is not complaining of headache dizziness or syncopal type feelings.  And psych does not complain of being depressed or anxious continues to be very  pleasant smiling in good humor  Immunization History  Administered Date(s) Administered  . Influenza,inj,Quad PF,6+ Mos 12/26/2013, 07/09/2015  . Influenza-Unspecified 07/31/2017  . PPD Test 03/07/2015  . Pneumococcal Conjugate-13 07/31/2017  . Pneumococcal Polysaccharide-23 03/04/2015  . Tdap 08/22/2017   Pertinent  Health Maintenance Due  Topic Date Due  . URINE MICROALBUMIN  10/17/2018 (Originally 08/23/1956)  . HEMOGLOBIN A1C  01/22/2019  . FOOT EXAM  05/20/2019  . OPHTHALMOLOGY EXAM  09/01/2019  . COLONOSCOPY  04/28/2027  . INFLUENZA VACCINE  Completed  . PNA vac Low Risk Adult  Completed   Fall Risk  02/18/2018  Falls in the past year? No   Functional Status Survey:    Vitals:   09/17/18 1405  BP: 116/76  Pulse: 89  Resp: 20  Temp: 97.7 F (36.5 C)  TempSrc: Oral  SpO2: 98%  Physical Exam  In general this is a pleasant male in no distress sitting comfortably in his wheelchair.  His skin is warm and dry.  Eyes sclera and conjunctive are clear visual acuity appears grossly intact.  Oropharynx is clear mucous membranes moist.  Heart is regular rate and rhythm without murmur gallop or rub he has mild baseline lower extremity edema.  Abdomen is soft nontender with positive bowel sounds.  Musculoskeletal moves all the extremities at baseline continues to ambulate about facility in his wheelchair strength appears to be intact all 4 extremities.  Neurologic as noted above he is alert could not really appreciate lateralizing findings.  Psych he is pleasant and appropriate does have some cognitive deficits at baseline   Labs reviewed: Recent Labs    01/22/18 0945 04/22/18 0718 04/26/18 1236 07/24/18 0737 08/13/18 0935 09/07/18 1356  NA 137 137 137 136 139 138  K 3.4* 4.3 4.8 4.7 5.2* 4.6  CL 94* 101 102 99 103 102  CO2 21* 25 21* 26  --   --   GLUCOSE 143* 271* 165* 215* 176* 210*  BUN 61* 51* 62* 29* 57* 39*  CREATININE 10.63* 9.14* 11.66* 8.42*  12.10* 9.80*  CALCIUM 8.2* 8.9 8.6* 8.9  --   --   PHOS 7.9*  --   --   --   --   --    Recent Labs    01/19/18 1608 01/22/18 0945  AST 22  --   ALT 18  --   ALKPHOS 152*  --   BILITOT 0.8  --   PROT 6.8  --   ALBUMIN 3.3* 3.2*   Recent Labs    01/19/18 0700 01/19/18 1608  04/22/18 0718 04/26/18 1236 07/24/18 0737 08/13/18 0935 09/07/18 1356  WBC 8.9 7.4   < > 6.6 8.7 5.4  --   --   NEUTROABS 6.8 5.3  --  3.5  --   --   --   --   HGB 11.6* 11.0*   < > 11.7* 11.0* 11.4* 12.9* 14.3  HCT 36.3* 34.8*   < > 36.0* 33.7* 34.6* 38.0* 42.0  MCV 106.1* 106.4*   < > 105.6* 105.0* 107.1*  --   --   PLT 145* 143*   < > 142* 125* 128*  --   --    < > = values in this interval not displayed.   Lab Results  Component Value Date   TSH 3.556 05/25/2018   Lab Results  Component Value Date   HGBA1C 8.5 (H) 07/24/2018   Lab Results  Component Value Date   CHOL 80 07/03/2018   HDL 19 (L) 07/03/2018   LDLCALC UNABLE TO CALCULATE IF TRIGLYCERIDE OVER 400 mg/dL 07/03/2018   TRIG 481 (H) 07/03/2018   CHOLHDL 4.2 07/03/2018    Significant Diagnostic Results in last 30 days:  Ir US Guide Vasc Access Right  Result Date: 09/07/2018 INDICATION: 72 year old with end-stage renal disease and right basilic vein transposition AV fistula. Fistula was declotted on 04/26/2018. Now the fistula is not working well during dialysis and concern for thrombosis. EXAM: RIGHT UPPER EXTREMITY FISTULOGRAM BALLOON ANGIOPLASTY OF THE VEIN AT THE ANASTOMOSIS AND BALLOON ANGIOPLASTY OF THE OUTFLOW VEIN IN THE MID HUMERAL REGION. ULTRASOUND GUIDANCE FOR VASCULAR ACCESS MEDICATIONS: 2 mg Versed, 150 mcg fentanyl ANESTHESIA/SEDATION: Moderate Sedation Time:  59 minutes The patient was continuously monitored during the procedure by the interventional radiology nurse under my direct supervision. FLUOROSCOPY TIME:  Fluoroscopy Time: 6 minutes  48 seconds (17 mGy). CONTRAST:  60 mL [id number] COMPLICATIONS: None immediate.  PROCEDURE: Informed written consent was obtained from the patient after a thorough discussion of the procedural risks, benefits and alternatives. All questions were addressed. A timeout was performed prior to the initiation of the procedure. Right arm was evaluated with ultrasound. 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 Right arm was prepped and draped in sterile fashion. Maximal barrier sterile technique was utilized including caps, mask, sterile gowns, sterile gloves, sterile drape, hand hygiene and skin antiseptic. Skin was anesthetized with 1% lidocaine. 21 gauge needle directed into the vein near the anastomosis and towards the central veins. Micropuncture dilator set was placed and fistulogram images were obtained. Area of stenosis in mid humerus region was identified. Another area of stenosis was identified in the distal vein near the anastomosis. The existing access was upsized to a 6 [place] vascular sheath. A Bentson wire was advanced centrally. The mid humeral basilic stenosis was angioplastied with a 6 mm x 40 mm balloon. Follow-up venography was performed. Subsequently, a needle was directed towards the arterial anastomosis and a micropuncture dilator set was placed. A 5 French vascular sheath was eventually placed with difficulty. A Kumpe catheter and Bentson wire were advanced into the artery. The arterial anastomosis and distal basilic vein were angioplastied with a 4 mm x 2 mm Mustang balloon. Follow-up fistulogram images were obtained. Follow-up images demonstrated recurrent irregularity in the mid humeral region. The mid humeral venous stenosis was treated again with the 6 mm x 40 mm balloon and final fistulogram images were obtained. Vascular sheath removed with pursestring sutures. FINDINGS: Right upper arm basilic transposition is patent but there is a large amount of  mural thrombus within an aneurysm segment the mid humeral region. Near this aneurysm segment, there is a focal stenosis measuring greater than 50%. This area responded very well to balloon angioplasty and markedly improved flow through the mid humeral outflow vein following balloon angioplasty. Critical stenosis in the distal basilic vein near the anastomosis measuring greater than 70%. This area was treated with a 4 mm balloon. Stenosis resolved following balloon angioplasty. Excellent flow through the AV fistula at the end of procedure. Central veins are patent. IMPRESSION: 1. Critical stenosis in the basilic vein near the arterial anastomosis. The vein and anastomosis were successfully treated with a 4 mm balloon. 2. Focal moderate stenosis in the mid humeral basilic vein was successfully treated with a 6 mm balloon. 3. 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 ACCESS: This access remains amenable to future percutaneous interventions as clinically indicated. Electronically Signed   By: Markus Daft M.D.   On: 09/07/2018 16:40   Ir Av Dialy Shunt Intro Needle/intracath Initial W/pta/img Right  Result Date: 09/07/2018 INDICATION: 71 year old with end-stage renal disease and right basilic vein transposition AV fistula. Fistula was declotted on 04/26/2018. Now the fistula is not working well during dialysis and concern for thrombosis. EXAM: RIGHT UPPER EXTREMITY FISTULOGRAM BALLOON ANGIOPLASTY OF THE VEIN AT THE ANASTOMOSIS AND BALLOON ANGIOPLASTY OF THE OUTFLOW VEIN IN THE MID HUMERAL REGION. ULTRASOUND GUIDANCE FOR VASCULAR ACCESS MEDICATIONS: 2 mg Versed, 150 mcg fentanyl ANESTHESIA/SEDATION: Moderate Sedation Time:  59 minutes The patient was continuously monitored during the procedure by the interventional radiology nurse under my direct supervision. FLUOROSCOPY  TIME:  Fluoroscopy Time: 6 minutes 48 seconds  (17 mGy). CONTRAST:  60 mL [id number] COMPLICATIONS: None immediate. PROCEDURE: Informed written consent was obtained from the patient after a thorough discussion of the procedural risks, benefits and alternatives. All questions were addressed. A timeout was performed prior to the initiation of the procedure. Right arm was evaluated with ultrasound. 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 Right arm was prepped and draped in sterile fashion. Maximal barrier sterile technique was utilized including caps, mask, sterile gowns, sterile gloves, sterile drape, hand hygiene and skin antiseptic. Skin was anesthetized with 1% lidocaine. 21 gauge needle directed into the vein near the anastomosis and towards the central veins. Micropuncture dilator set was placed and fistulogram images were obtained. Area of stenosis in mid humerus region was identified. Another area of stenosis was identified in the distal vein near the anastomosis. The existing access was upsized to a 6 [place] vascular sheath. A Bentson wire was advanced centrally. The mid humeral basilic stenosis was angioplastied with a 6 mm x 40 mm balloon. Follow-up venography was performed. Subsequently, a needle was directed towards the arterial anastomosis and a micropuncture dilator set was placed. A 5 French vascular sheath was eventually placed with difficulty. A Kumpe catheter and Bentson wire were advanced into the artery. The arterial anastomosis and distal basilic vein were angioplastied with a 4 mm x 2 mm Mustang balloon. Follow-up fistulogram images were obtained. Follow-up images demonstrated recurrent irregularity in the mid humeral region. The mid humeral venous stenosis was treated again with the 6 mm x 40 mm balloon and final fistulogram images were obtained. Vascular sheath removed with pursestring sutures. FINDINGS: Right upper  arm basilic transposition is patent but there is a large amount of mural thrombus within an aneurysm segment the mid humeral region. Near this aneurysm segment, there is a focal stenosis measuring greater than 50%. This area responded very well to balloon angioplasty and markedly improved flow through the mid humeral outflow vein following balloon angioplasty. Critical stenosis in the distal basilic vein near the anastomosis measuring greater than 70%. This area was treated with a 4 mm balloon. Stenosis resolved following balloon angioplasty. Excellent flow through the AV fistula at the end of procedure. Central veins are patent. IMPRESSION: 1. Critical stenosis in the basilic vein near the arterial anastomosis. The vein and anastomosis were successfully treated with a 4 mm balloon. 2. Focal moderate stenosis in the mid humeral basilic vein was successfully treated with a 6 mm balloon. 3. 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 ACCESS: This access remains amenable to future percutaneous interventions as clinically indicated. Electronically Signed   By: Markus Daft M.D.   On: 09/07/2018 16:40    Assessment/Plan  #2 type 2 diabetes it appears his blood sugars are fairly consistently elevated above 200 largely in the 200s range rarely above 300 occasionally does have a mid 100 reading this is more so in the middle the day but still baseline is somewhat above 200-will increase his Basaglar insulin up to 12 units and monitor continue calling provider if CBG is greater than 250 I suspect we can probably go up  bit more on this but with his history of end-stage renal disease with dialysis and concerns for any hypoglycemia will be somewhat  more  conservative and monitor for now.  [id number]

## 2018-09-18 DIAGNOSIS — N186 End stage renal disease: Secondary | ICD-10-CM | POA: Diagnosis not present

## 2018-09-18 DIAGNOSIS — D509 Iron deficiency anemia, unspecified: Secondary | ICD-10-CM | POA: Diagnosis not present

## 2018-09-18 DIAGNOSIS — N2581 Secondary hyperparathyroidism of renal origin: Secondary | ICD-10-CM | POA: Diagnosis not present

## 2018-09-18 DIAGNOSIS — Z992 Dependence on renal dialysis: Secondary | ICD-10-CM | POA: Diagnosis not present

## 2018-09-18 DIAGNOSIS — D631 Anemia in chronic kidney disease: Secondary | ICD-10-CM | POA: Diagnosis not present

## 2018-09-21 DIAGNOSIS — D631 Anemia in chronic kidney disease: Secondary | ICD-10-CM | POA: Diagnosis not present

## 2018-09-21 DIAGNOSIS — N2581 Secondary hyperparathyroidism of renal origin: Secondary | ICD-10-CM | POA: Diagnosis not present

## 2018-09-21 DIAGNOSIS — Z992 Dependence on renal dialysis: Secondary | ICD-10-CM | POA: Diagnosis not present

## 2018-09-21 DIAGNOSIS — D509 Iron deficiency anemia, unspecified: Secondary | ICD-10-CM | POA: Diagnosis not present

## 2018-09-21 DIAGNOSIS — N186 End stage renal disease: Secondary | ICD-10-CM | POA: Diagnosis not present

## 2018-09-22 DIAGNOSIS — Z992 Dependence on renal dialysis: Secondary | ICD-10-CM | POA: Diagnosis not present

## 2018-09-22 DIAGNOSIS — D631 Anemia in chronic kidney disease: Secondary | ICD-10-CM | POA: Diagnosis not present

## 2018-09-22 DIAGNOSIS — N186 End stage renal disease: Secondary | ICD-10-CM | POA: Diagnosis not present

## 2018-09-22 DIAGNOSIS — D509 Iron deficiency anemia, unspecified: Secondary | ICD-10-CM | POA: Diagnosis not present

## 2018-09-22 DIAGNOSIS — N2581 Secondary hyperparathyroidism of renal origin: Secondary | ICD-10-CM | POA: Diagnosis not present

## 2018-09-25 DIAGNOSIS — N186 End stage renal disease: Secondary | ICD-10-CM | POA: Diagnosis not present

## 2018-09-25 DIAGNOSIS — Z992 Dependence on renal dialysis: Secondary | ICD-10-CM | POA: Diagnosis not present

## 2018-09-25 DIAGNOSIS — D509 Iron deficiency anemia, unspecified: Secondary | ICD-10-CM | POA: Diagnosis not present

## 2018-09-25 DIAGNOSIS — D631 Anemia in chronic kidney disease: Secondary | ICD-10-CM | POA: Diagnosis not present

## 2018-09-25 DIAGNOSIS — N2581 Secondary hyperparathyroidism of renal origin: Secondary | ICD-10-CM | POA: Diagnosis not present

## 2018-09-28 DIAGNOSIS — Z992 Dependence on renal dialysis: Secondary | ICD-10-CM | POA: Diagnosis not present

## 2018-09-28 DIAGNOSIS — N2581 Secondary hyperparathyroidism of renal origin: Secondary | ICD-10-CM | POA: Diagnosis not present

## 2018-09-28 DIAGNOSIS — D509 Iron deficiency anemia, unspecified: Secondary | ICD-10-CM | POA: Diagnosis not present

## 2018-09-28 DIAGNOSIS — N186 End stage renal disease: Secondary | ICD-10-CM | POA: Diagnosis not present

## 2018-09-28 DIAGNOSIS — D631 Anemia in chronic kidney disease: Secondary | ICD-10-CM | POA: Diagnosis not present

## 2018-09-30 DIAGNOSIS — D509 Iron deficiency anemia, unspecified: Secondary | ICD-10-CM | POA: Diagnosis not present

## 2018-09-30 DIAGNOSIS — Z992 Dependence on renal dialysis: Secondary | ICD-10-CM | POA: Diagnosis not present

## 2018-09-30 DIAGNOSIS — N2581 Secondary hyperparathyroidism of renal origin: Secondary | ICD-10-CM | POA: Diagnosis not present

## 2018-09-30 DIAGNOSIS — N186 End stage renal disease: Secondary | ICD-10-CM | POA: Diagnosis not present

## 2018-09-30 DIAGNOSIS — D631 Anemia in chronic kidney disease: Secondary | ICD-10-CM | POA: Diagnosis not present

## 2018-10-02 DIAGNOSIS — D631 Anemia in chronic kidney disease: Secondary | ICD-10-CM | POA: Diagnosis not present

## 2018-10-02 DIAGNOSIS — N186 End stage renal disease: Secondary | ICD-10-CM | POA: Diagnosis not present

## 2018-10-02 DIAGNOSIS — Z992 Dependence on renal dialysis: Secondary | ICD-10-CM | POA: Diagnosis not present

## 2018-10-02 DIAGNOSIS — D509 Iron deficiency anemia, unspecified: Secondary | ICD-10-CM | POA: Diagnosis not present

## 2018-10-02 DIAGNOSIS — N2581 Secondary hyperparathyroidism of renal origin: Secondary | ICD-10-CM | POA: Diagnosis not present

## 2018-10-05 DIAGNOSIS — Z992 Dependence on renal dialysis: Secondary | ICD-10-CM | POA: Diagnosis not present

## 2018-10-05 DIAGNOSIS — D631 Anemia in chronic kidney disease: Secondary | ICD-10-CM | POA: Diagnosis not present

## 2018-10-05 DIAGNOSIS — N186 End stage renal disease: Secondary | ICD-10-CM | POA: Diagnosis not present

## 2018-10-05 DIAGNOSIS — D509 Iron deficiency anemia, unspecified: Secondary | ICD-10-CM | POA: Diagnosis not present

## 2018-10-05 DIAGNOSIS — N2581 Secondary hyperparathyroidism of renal origin: Secondary | ICD-10-CM | POA: Diagnosis not present

## 2018-10-07 DIAGNOSIS — Z992 Dependence on renal dialysis: Secondary | ICD-10-CM | POA: Diagnosis not present

## 2018-10-07 DIAGNOSIS — D509 Iron deficiency anemia, unspecified: Secondary | ICD-10-CM | POA: Diagnosis not present

## 2018-10-07 DIAGNOSIS — N2581 Secondary hyperparathyroidism of renal origin: Secondary | ICD-10-CM | POA: Diagnosis not present

## 2018-10-07 DIAGNOSIS — D631 Anemia in chronic kidney disease: Secondary | ICD-10-CM | POA: Diagnosis not present

## 2018-10-07 DIAGNOSIS — N186 End stage renal disease: Secondary | ICD-10-CM | POA: Diagnosis not present

## 2018-10-09 DIAGNOSIS — D509 Iron deficiency anemia, unspecified: Secondary | ICD-10-CM | POA: Diagnosis not present

## 2018-10-09 DIAGNOSIS — N2581 Secondary hyperparathyroidism of renal origin: Secondary | ICD-10-CM | POA: Diagnosis not present

## 2018-10-09 DIAGNOSIS — D631 Anemia in chronic kidney disease: Secondary | ICD-10-CM | POA: Diagnosis not present

## 2018-10-09 DIAGNOSIS — Z992 Dependence on renal dialysis: Secondary | ICD-10-CM | POA: Diagnosis not present

## 2018-10-09 DIAGNOSIS — N186 End stage renal disease: Secondary | ICD-10-CM | POA: Diagnosis not present

## 2018-10-12 ENCOUNTER — Non-Acute Institutional Stay (SKILLED_NURSING_FACILITY): Payer: Medicare Other | Admitting: Internal Medicine

## 2018-10-12 ENCOUNTER — Encounter: Payer: Self-pay | Admitting: Internal Medicine

## 2018-10-12 DIAGNOSIS — Z992 Dependence on renal dialysis: Secondary | ICD-10-CM | POA: Diagnosis not present

## 2018-10-12 DIAGNOSIS — E039 Hypothyroidism, unspecified: Secondary | ICD-10-CM

## 2018-10-12 DIAGNOSIS — E1122 Type 2 diabetes mellitus with diabetic chronic kidney disease: Secondary | ICD-10-CM | POA: Diagnosis not present

## 2018-10-12 DIAGNOSIS — N2581 Secondary hyperparathyroidism of renal origin: Secondary | ICD-10-CM | POA: Diagnosis not present

## 2018-10-12 DIAGNOSIS — N186 End stage renal disease: Secondary | ICD-10-CM | POA: Diagnosis not present

## 2018-10-12 DIAGNOSIS — E782 Mixed hyperlipidemia: Secondary | ICD-10-CM

## 2018-10-12 DIAGNOSIS — Z794 Long term (current) use of insulin: Secondary | ICD-10-CM | POA: Diagnosis not present

## 2018-10-12 DIAGNOSIS — D631 Anemia in chronic kidney disease: Secondary | ICD-10-CM | POA: Diagnosis not present

## 2018-10-12 DIAGNOSIS — D509 Iron deficiency anemia, unspecified: Secondary | ICD-10-CM | POA: Diagnosis not present

## 2018-10-12 NOTE — Progress Notes (Addendum)
Location:    [facility] Room Number: 112/D Place of Service:  SNF (31) Provider:  Veleta Miners MD  [name], MD  Patient Care Team: [name], MD as PCP - General (Internal Medicine) Cassandria Anger, MD as Consulting Physician (Endocrinology) Fran Lowes, MD as Consulting Physician (Nephrology) Gala Romney Cristopher Estimable, MD as Consulting Physician (Gastroenterology)  Extended Emergency Contact Information Primary Emergency Contact: Betsey Amen Address: DSS  [place] Phone: [phone] Work Phone: [phone] Relation: Legal Guardian Secondary Emergency Contact: Nissley,Richard Address: [place]          [place], [place] [address] [place] Phone: [phone] Relation: Brother  Code Status:  Full Code Goals of care: Advanced Directive information Advanced Directives 10/12/2018  Does Patient Have a Medical Advance Directive? Yes  Type of Advance Directive (No Data)  Does patient want to make changes to medical advance directive? No - Patient declined  Copy of [place] in Chart? No - copy requested  Would patient like information on creating a medical advance directive? No - Patient declined  Pre-existing out of facility DNR order (yellow form or pink MOST form) -     Chief Complaint  Patient presents with  . Medical Management of Chronic Issues    Routine visit of medical management    HPI:  Pt is a 72 y.o. male seen today for medical management of chronic diseases.   Patient is Long term Resident of facility.  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 Has gained weight of almost 10 lbs is now upto 215 lbs. Per nurses he is non compliant  with his diet and eats fast food which his Family brings almost everyday. No New Nursing issues. No Complain by Patient   Past Medical History:  Diagnosis Date  . Abnormal CT scan, kidney 10/06/2011  . Acute pyelonephritis 10/07/2011  . Anemia    normocytic  . Anxiety    mental retardation  . Bladder wall thickening 10/06/2011  . BPH (benign prostatic hypertrophy)   . Diabetes mellitus   . Dialysis patient [facility])    Tuesday, Thursday and Saturday,   . Edema     history of lower extremity edema  . GERD (gastroesophageal reflux disease)   . Heme positive stool   . Hydronephrosis   . Hyperkalemia   . Hyperlipidemia   . Hypernatremia   . Hypertension   . Hypothyroidism   . Impaired speech   . 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 MR (mental retardation)   . Muscle weakness   . Obstructive uropathy   . 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 Poor historian poor historian  . Protein calorie malnutrition ([place])   . Pyelonephritis   . Renal failure (ARF), acute on chronic (HCC)   . Renal insufficiency    chronic history  . Sepsis ([place])   . Smoking   . Uremia   . Urinary retention   . UTI (lower urinary tract infection) 10/06/2011   Past Surgical History:  Procedure Laterality Date  . A/V FISTULAGRAM N/A 08/13/2018   Procedure: A/V FISTULAGRAM - Right Upper;  Surgeon: Elam Dutch, MD;  Location: [place] [facility];  Service: Cardiovascular;  Laterality: N/A;  . AV FISTULA PLACEMENT Left 07/06/2015   Procedure:  INSERTION LEFT  ARM ARTERIOVENOUS GORTEX GRAFT;  Surgeon: Angelia Mould, MD;  Location: [facility];  Service: Vascular;  Laterality: Left;  . AV FISTULA PLACEMENT Right 02/26/2016   Procedure: ARTERIOVENOUS (AV) FISTULA CREATION ;  Surgeon: Angelia Mould, MD;  Location: [facility];  Service: Vascular;  Laterality: Right;  . [place] REMOVAL Left 10/09/2015   Procedure: REMOVAL OF ARTERIOVENOUS GORETEX GRAFT ([place]) Evacuation of Lymphocele, Vein Patch angioplasty  of brachial artery.;  Surgeon: Angelia Mould, MD;  Location: [facility];  Service: Vascular;  Laterality: Left;  . BASCILIC VEIN TRANSPOSITION Right 02/26/2016   Procedure: Right BASCILIC VEIN TRANSPOSITION;  Surgeon: Angelia Mould, MD;  Location: [facility];  Service: Vascular;  Laterality: Right;  . CIRCUMCISION N/A 01/04/2014   Procedure: CIRCUMCISION ADULT (procedure #1);  Surgeon: Marissa Nestle, MD;  Location: [facility];  Service: Urology;  Laterality: N/A;  . COLECTOMY N/A 05/04/2017   Procedure: TOTAL COLECTOMY;  Surgeon: Aviva Signs, MD;  Location: [facility];  Service: General;  Laterality: N/A;  . COLONOSCOPY N/A 04/27/2017   Procedure: COLONOSCOPY;  Surgeon: Daneil Dolin, MD;  [facility];  Service: Endoscopy;  Laterality: N/A;  245  . CYSTOSCOPY W/ RETROGRADES Bilateral 06/29/2015   Procedure: CYSTOSCOPY, DILATION OF URETHRAL STRICTURE WITH BILATERAL RETROGRADE PYELOGRAM,SUPRAPUBIC TUBE CHANGE;  Surgeon: Festus Aloe, MD;  Location: [facility];  Service: Urology;  Laterality: Bilateral;  . CYSTOSCOPY WITH URETHRAL DILATATION N/A 12/29/2013   Procedure: CYSTOSCOPY WITH URETHRAL DILATATION;  Surgeon: Marissa Nestle, MD;  [facility];  Service: Urology;  Laterality: N/A;  . ESOPHAGOGASTRODUODENOSCOPY N/A 04/27/2017   Procedure: ESOPHAGOGASTRODUODENOSCOPY (EGD);  Surgeon: Daneil Dolin, MD;  [facility];  Service: Endoscopy;  Laterality: N/A;  . IR AV DIALY SHUNT INTRO NEEDLE/INTRACATH INITIAL W/PTA/IMG RIGHT Right 09/07/2018  . IR THROMBECTOMY AV FISTULA W/THROMBOLYSIS/PTA INC/SHUNT/IMG RIGHT Right 04/26/2018  . IR US GUIDE VASC ACCESS RIGHT  04/26/2018  . IR US GUIDE VASC ACCESS RIGHT  09/07/2018  . ORIF FEMUR FRACTURE Right 11/22/2016   Procedure: OPEN REDUCTION INTERNAL FIXATION (ORIF) DISTAL FEMUR FRACTURE;  Surgeon: Rod Can, MD;  Location: [facility];  Service: Orthopedics;  Laterality: Right;  . PATCH ANGIOPLASTY Right 12/10/2017   Procedure: PATCH  ANGIOPLASTY;  Surgeon: Angelia Mould, MD;  Location: [facility];  Service: Vascular;  Laterality: Right;  . PERIPHERAL VASCULAR BALLOON ANGIOPLASTY  08/13/2018   Procedure: PERIPHERAL VASCULAR BALLOON ANGIOPLASTY;  Surgeon: Elam Dutch, MD;  Location: [facility];  Service: Cardiovascular;;  right AV fistula   . PERIPHERAL VASCULAR CATHETERIZATION N/A 10/08/2015   Procedure: A/V Shuntogram;  Surgeon: Angelia Mould, MD;  Location: [facility];  Service: Cardiovascular;  Laterality: N/A;  . THROMBECTOMY W/ EMBOLECTOMY Right 12/10/2017   Procedure: THROMBECTOMY REVISION RIGHT ARM  ARTERIOVENOUS FISTULA;  Surgeon: Angelia Mould, MD;  Location: [facility];  Service: Vascular;  Laterality: Right;  . TRANSURETHRAL RESECTION OF PROSTATE N/A 01/04/2014   Procedure: TRANSURETHRAL RESECTION OF THE PROSTATE (TURP) (procedure #2);  Surgeon: Marissa Nestle, MD;  [facility];  Service: Urology;  Laterality: N/A;    No Known Allergies  Outpatient Encounter Medications as of 10/12/2018  Medication Sig  . atorvastatin (LIPITOR) 80 MG tablet Take 80 mg by mouth every evening.  . Insulin Glargine (BASAGLAR KWIKPEN) 100 UNIT/ML SOPN Inject 12 Units into the skin at bedtime.   Marland Kitchen levothyroxine (SYNTHROID, LEVOTHROID) 88 MCG tablet Take 88 mcg by mouth daily before breakfast.  .  midodrine (PROAMATINE) 5 MG tablet Take 5 mg by mouth 3 (three) times a week. Take 5 mg on Tues, Thurs, and Sat at dialysis  . Multiple Vitamin (MULTIVITAMIN WITH MINERALS) TABS tablet Take 1 tablet by mouth every evening.   . Nutritional Supplements (NEPRO/CARBSTEADY PO) Take 1 Can by mouth 2 (two) times daily.  Marland Kitchen omeprazole (PRILOSEC) 40 MG capsule Take 40 mg by mouth at bedtime.   . polyethylene glycol (MIRALAX / GLYCOLAX) packet Take 17 g by mouth as needed for mild constipation.   . sevelamer carbonate (RENVELA) 800 MG tablet Take 800 mg by mouth daily.   . sevelamer carbonate (RENVELA) 800 MG  tablet Take 1,600 mg by mouth 3 (three) times daily with meals.  . tamsulosin (FLOMAX) 0.4 MG CAPS capsule Take 0.4 mg by mouth every evening. Give 30 minutes after a meal  . torsemide (DEMADEX) 10 MG tablet Take 5 tablets (50 mg total) by mouth daily.   No facility-administered encounter medications on file as of 10/12/2018.      Review of Systems  Review of Systems  Constitutional: Negative for activity change, appetite change, chills, diaphoresis, fatigue and fever.  HENT: Negative for mouth sores, postnasal drip, rhinorrhea, sinus pain and sore throat.   Respiratory: Negative for apnea, cough, chest tightness, shortness of breath and wheezing.   Cardiovascular: Negative for chest pain, palpitations and leg swelling.  Gastrointestinal: Negative for abdominal distention, abdominal pain, constipation, diarrhea, nausea and vomiting.  Genitourinary: Negative for dysuria and frequency.  Musculoskeletal: Negative for arthralgias, joint swelling and myalgias.  Skin: Negative for rash.  Neurological: Negative for dizziness, syncope, weakness, light-headedness and numbness.  Psychiatric/Behavioral: Negative for behavioral problems, confusion and sleep disturbance.     Immunization History  Administered Date(s) Administered  . Influenza,inj,Quad PF,6+ Mos 12/26/2013, 07/09/2015  . Influenza-Unspecified 07/31/2017  . PPD Test 03/07/2015  . Pneumococcal Conjugate-13 07/31/2017  . Pneumococcal Polysaccharide-23 03/04/2015  . Tdap 08/22/2017   Pertinent  Health Maintenance Due  Topic Date Due  . URINE MICROALBUMIN  10/17/2018 (Originally 08/23/1956)  . HEMOGLOBIN A1C  01/22/2019  . FOOT EXAM  05/20/2019  . OPHTHALMOLOGY EXAM  09/01/2019  . COLONOSCOPY  04/28/2027  . INFLUENZA VACCINE  Completed  . PNA vac Low Risk Adult  Completed   Fall Risk  02/18/2018  Falls in the past year? No   Functional Status Survey:    Vitals:   10/12/18 1142  BP: 103/60  Pulse: 66  Resp: 18  Temp:  98 F (36.7 C)  TempSrc: Oral  SpO2: 100%  Weight: 215 lb 6.4 oz (97.7 kg)  Height: 5\' 8"  (1.727 m)   Body mass index is 32.75 kg/m. Physical Exam Constitutional:      Appearance: He is well-developed.  HENT:     Head: Normocephalic.  Eyes:     Pupils: Pupils are equal, round, and reactive to light.  Neck:     Musculoskeletal: Neck supple.  Cardiovascular:     Rate and Rhythm: Normal rate.     Heart sounds: Normal heart sounds.  Pulmonary:     Effort: Pulmonary effort is normal. No respiratory distress.     Breath sounds: Normal breath sounds. No wheezing.  Abdominal:     General: Bowel sounds are normal. There is no distension.     Palpations: Abdomen is soft.     Tenderness: There is no abdominal tenderness. There is no rebound.  Skin:    General: Skin is warm and dry.  Neurological:  Mental Status: He is alert.     Comments: Is mostly Wheelchair Bound. He can walk with Gilford Rile and is independent in his transfers  Psychiatric:        Behavior: Behavior normal.        Thought Content: Thought content normal.     Labs reviewed: Recent Labs    01/22/18 0945 04/22/18 0718 04/26/18 1236 07/24/18 0737 08/13/18 0935 09/07/18 1356  NA 137 137 137 136 139 138  K 3.4* 4.3 4.8 4.7 5.2* 4.6  CL 94* 101 102 99 103 102  CO2 21* 25 21* 26  --   --   GLUCOSE 143* 271* 165* 215* 176* 210*  BUN 61* 51* 62* 29* 57* 39*  CREATININE 10.63* 9.14* 11.66* 8.42* 12.10* 9.80*  CALCIUM 8.2* 8.9 8.6* 8.9  --   --   PHOS 7.9*  --   --   --   --   --    Recent Labs    01/19/18 1608 01/22/18 0945  AST 22  --   ALT 18  --   ALKPHOS 152*  --   BILITOT 0.8  --   PROT 6.8  --   ALBUMIN 3.3* 3.2*   Recent Labs    01/19/18 0700 01/19/18 1608  04/22/18 0718 04/26/18 1236 07/24/18 0737 08/13/18 0935 09/07/18 1356  WBC 8.9 7.4   < > 6.6 8.7 5.4  --   --   NEUTROABS 6.8 5.3  --  3.5  --   --   --   --   HGB 11.6* 11.0*   < > 11.7* 11.0* 11.4* 12.9* 14.3  HCT 36.3* 34.8*    < > 36.0* 33.7* 34.6* 38.0* 42.0  MCV 106.1* 106.4*   < > 105.6* 105.0* 107.1*  --   --   PLT 145* 143*   < > 142* 125* 128*  --   --    < > = values in this interval not displayed.   Lab Results  Component Value Date   TSH 3.556 05/25/2018   Lab Results  Component Value Date   HGBA1C 8.5 (H) 07/24/2018   Lab Results  Component Value Date   CHOL 80 07/03/2018   HDL 19 (L) 07/03/2018   LDLCALC UNABLE TO CALCULATE IF TRIGLYCERIDE OVER 400 mg/dL 07/03/2018   TRIG 481 (H) 07/03/2018   CHOLHDL 4.2 07/03/2018    Significant Diagnostic Results in last 30 days:  No results found.  Assessment/Plan Type 2 diabetes mellitus His BS are running more then 200 most of the day Will Increase his Lantus to 16 units. Repeat A1C Also Start on Novolog Boluses. 6 Units with Meal BID if Sugars more then 150 Eye Exam and Foot exam done in 10/19 ESRD  On Dialysis Midodrine for BP Also on Demadex  BPH Continue on Flomax  Mixed hyperlipidemia He has high triglycerides but mostly less then 500 On increase dose of Lipitor D/W him again to follow more restrictive diet Repeat Lipid Profile S/P colectomy Appetite has improved and is doing well with his weight Benign prostatic hyperplasia  On Flomax Hypothyroid Continue same dose of Synthroid TSH normal in 07/19 Repeat TSH Macrocytosis Will get Vit B12 level. Patient abdominal CT scan showedin 03/19 14 mm low-density/cystic pancreatic head lesion. Follow-up pancreatic protocol abdominal CT is recommended in 2 years   Family/ staff Communication:   Labs/tests ordered:

## 2018-10-14 DIAGNOSIS — N2581 Secondary hyperparathyroidism of renal origin: Secondary | ICD-10-CM | POA: Diagnosis not present

## 2018-10-14 DIAGNOSIS — D509 Iron deficiency anemia, unspecified: Secondary | ICD-10-CM | POA: Diagnosis not present

## 2018-10-14 DIAGNOSIS — D631 Anemia in chronic kidney disease: Secondary | ICD-10-CM | POA: Diagnosis not present

## 2018-10-14 DIAGNOSIS — Z992 Dependence on renal dialysis: Secondary | ICD-10-CM | POA: Diagnosis not present

## 2018-10-14 DIAGNOSIS — N186 End stage renal disease: Secondary | ICD-10-CM | POA: Diagnosis not present

## 2018-10-16 DIAGNOSIS — D631 Anemia in chronic kidney disease: Secondary | ICD-10-CM | POA: Diagnosis not present

## 2018-10-16 DIAGNOSIS — F70 Mild intellectual disabilities: Secondary | ICD-10-CM | POA: Diagnosis not present

## 2018-10-16 DIAGNOSIS — F4322 Adjustment disorder with anxiety: Secondary | ICD-10-CM | POA: Diagnosis not present

## 2018-10-16 DIAGNOSIS — Z992 Dependence on renal dialysis: Secondary | ICD-10-CM | POA: Diagnosis not present

## 2018-10-16 DIAGNOSIS — D509 Iron deficiency anemia, unspecified: Secondary | ICD-10-CM | POA: Diagnosis not present

## 2018-10-16 DIAGNOSIS — N2581 Secondary hyperparathyroidism of renal origin: Secondary | ICD-10-CM | POA: Diagnosis not present

## 2018-10-16 DIAGNOSIS — N186 End stage renal disease: Secondary | ICD-10-CM | POA: Diagnosis not present

## 2018-10-18 DIAGNOSIS — N186 End stage renal disease: Secondary | ICD-10-CM | POA: Diagnosis not present

## 2018-10-18 DIAGNOSIS — D509 Iron deficiency anemia, unspecified: Secondary | ICD-10-CM | POA: Diagnosis not present

## 2018-10-18 DIAGNOSIS — D631 Anemia in chronic kidney disease: Secondary | ICD-10-CM | POA: Diagnosis not present

## 2018-10-18 DIAGNOSIS — N2581 Secondary hyperparathyroidism of renal origin: Secondary | ICD-10-CM | POA: Diagnosis not present

## 2018-10-18 DIAGNOSIS — Z992 Dependence on renal dialysis: Secondary | ICD-10-CM | POA: Diagnosis not present

## 2018-10-19 ENCOUNTER — Encounter: Payer: Self-pay | Admitting: Internal Medicine

## 2018-10-19 ENCOUNTER — Non-Acute Institutional Stay (SKILLED_NURSING_FACILITY): Payer: Medicare Other | Admitting: Internal Medicine

## 2018-10-19 DIAGNOSIS — E119 Type 2 diabetes mellitus without complications: Secondary | ICD-10-CM | POA: Diagnosis not present

## 2018-10-19 DIAGNOSIS — Z794 Long term (current) use of insulin: Secondary | ICD-10-CM

## 2018-10-19 NOTE — Progress Notes (Signed)
Location:    [facility] Room Number: 112/D Place of Service:  SNF (31) Provider:  Veleta Miners MD  [name], MD  Patient Care Team: [name], MD as PCP - General (Internal Medicine) Cassandria Anger, MD as Consulting Physician (Endocrinology) Fran Lowes, MD as Consulting Physician (Nephrology) Gala Romney Cristopher Estimable, MD as Consulting Physician (Gastroenterology)  Extended Emergency Contact Information Primary Emergency Contact: Betsey Amen Address: DSS  [place] of [place] Phone: [phone] Work Phone: [phone] Relation: Legal Guardian Secondary Emergency Contact: Cinco,Richard Address: [address]          [place], [place] [address] [place] Phone: [phone] Relation: Brother  Code Status:  Full Code Goals of care: Advanced Directive information Advanced Directives 10/19/2018  Does Patient Have a Medical Advance Directive? Yes  Type of Advance Directive (No Data)  Does patient want to make changes to medical advance directive? No - Patient declined  Copy of [place] in Chart? No - copy requested  Would patient like information on creating a medical advance directive? No - Patient declined  Pre-existing out of facility DNR order (yellow form or pink MOST form) -     Chief Complaint  Patient presents with  . Acute Visit    BS Management    HPI:  Pt is a 72 y.o. male seen today for an acute visit for BS running more then 300 Patient is Long term Resident of facility.  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 His Sugars are running more then 250 most of the time He denies any Cough,  Fever, Nausea or Vomiting  Past Medical History:  Diagnosis Date  . Abnormal CT scan, kidney 10/06/2011  . Acute pyelonephritis 10/07/2011  . Anemia    normocytic  . Anxiety    mental retardation  . Bladder wall thickening 10/06/2011  . BPH (benign prostatic hypertrophy)   . Diabetes mellitus   . Dialysis patient [facility])    Tuesday, Thursday and Saturday,   . Edema     history of lower extremity edema  . GERD (gastroesophageal reflux disease)   . Heme positive stool   . Hydronephrosis   . Hyperkalemia   . Hyperlipidemia   . Hypernatremia   . Hypertension   . Hypothyroidism   . Impaired speech   . 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 MR (mental retardation)   . Muscle weakness   . Obstructive uropathy   . 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 Poor historian poor historian  . Protein calorie malnutrition ([place])   . Pyelonephritis   . Renal failure (ARF), acute on chronic (HCC)   . Renal insufficiency    chronic history  . Sepsis ([place])   . Smoking   . Uremia   . Urinary retention   . UTI (lower urinary tract infection) 10/06/2011   Past Surgical History:  Procedure Laterality Date  . A/V FISTULAGRAM N/A 08/13/2018   Procedure: A/V FISTULAGRAM - Right Upper;  Surgeon: Elam Dutch, MD;  Location: [place] CV LAB;  Service: Cardiovascular;  Laterality: N/A;  . AV FISTULA PLACEMENT Left 07/06/2015   Procedure:  INSERTION LEFT ARM ARTERIOVENOUS GORTEX GRAFT;  Surgeon: Angelia Mould, MD;  Location[facility]  OR;  Service: Vascular;  Laterality: Left;  . AV FISTULA PLACEMENT Right 02/26/2016   Procedure: ARTERIOVENOUS (AV) FISTULA CREATION ;  Surgeon: Angelia Mould, MD;  Location: [place];  Service: Vascular;  Laterality: Right;  . [place] REMOVAL Left 10/09/2015   Procedure: REMOVAL OF ARTERIOVENOUS GORETEX GRAFT ([place]) Evacuation of Lymphocele, Vein Patch angioplasty of brachial artery.;  Surgeon: Angelia Mould, MD;  Location: [place];  Service: Vascular;   Laterality: Left;  . BASCILIC VEIN TRANSPOSITION Right 02/26/2016   Procedure: Right BASCILIC VEIN TRANSPOSITION;  Surgeon: Angelia Mould, MD;  Location: [place];  Service: Vascular;  Laterality: Right;  . CIRCUMCISION N/A 01/04/2014   Procedure: CIRCUMCISION ADULT (procedure #1);  Surgeon: Marissa Nestle, MD;  Location[facility];  Service: Urology;  Laterality: N/A;  . COLECTOMY N/A 05/04/2017   Procedure: TOTAL COLECTOMY;  Surgeon: Aviva Signs, MD;  Location: [facility];  Service: General;  Laterality: N/A;  . COLONOSCOPY N/A 04/27/2017   Procedure: COLONOSCOPY;  Surgeon: Daneil Dolin, MD;  [facility];  Service: Endoscopy;  Laterality: N/A;  245  . CYSTOSCOPY W/ RETROGRADES Bilateral 06/29/2015   Procedure: CYSTOSCOPY, DILATION OF URETHRAL STRICTURE WITH BILATERAL RETROGRADE PYELOGRAM,SUPRAPUBIC TUBE CHANGE;  Surgeon: Festus Aloe, MD;  Location[facility];  Service: Urology;  Laterality: Bilateral;  . CYSTOSCOPY WITH URETHRAL DILATATION N/A 12/29/2013   Procedure: CYSTOSCOPY WITH URETHRAL DILATATION;  Surgeon: Marissa Nestle, MD;  [facility];  Service: Urology;  Laterality: N/A;  . ESOPHAGOGASTRODUODENOSCOPY N/A 04/27/2017   Procedure: ESOPHAGOGASTRODUODENOSCOPY (EGD);  Surgeon: Daneil Dolin, MD;  [facility];  Service: Endoscopy;  Laterality: N/A;  . IR AV DIALY SHUNT INTRO NEEDLE/INTRACATH INITIAL W/PTA/IMG RIGHT Right 09/07/2018  . IR THROMBECTOMY AV FISTULA W/THROMBOLYSIS/PTA INC/SHUNT/IMG RIGHT Right 04/26/2018  . IR US GUIDE VASC ACCESS RIGHT  04/26/2018  . IR US GUIDE VASC ACCESS RIGHT  09/07/2018  . ORIF FEMUR FRACTURE Right 11/22/2016   Procedure: OPEN REDUCTION INTERNAL FIXATION (ORIF) DISTAL FEMUR FRACTURE;  Surgeon: Rod Can, MD;  Location: [place];  Service: Orthopedics;  Laterality: Right;  . PATCH ANGIOPLASTY Right 12/10/2017   Procedure: PATCH ANGIOPLASTY;  Surgeon: Angelia Mould, MD;  [facility];  Service: Vascular;  Laterality:  Right;  . PERIPHERAL VASCULAR BALLOON ANGIOPLASTY  08/13/2018   Procedure: PERIPHERAL VASCULAR BALLOON ANGIOPLASTY;  Surgeon: Elam Dutch, MD;  Location: [place] CV LAB;  Service: Cardiovascular;;  right AV fistula   . PERIPHERAL VASCULAR CATHETERIZATION N/A 10/08/2015   Procedure: A/V Shuntogram;  Surgeon: Angelia Mould, MD;  Location: [place] CV LAB;  Service: Cardiovascular;  Laterality: N/A;  . THROMBECTOMY W/ EMBOLECTOMY Right 12/10/2017   Procedure: THROMBECTOMY REVISION RIGHT ARM  ARTERIOVENOUS FISTULA;  Surgeon: Angelia Mould, MD;  Location: [place];  Service: Vascular;  Laterality: Right;  . TRANSURETHRAL RESECTION OF PROSTATE N/A 01/04/2014   Procedure: TRANSURETHRAL RESECTION OF THE PROSTATE (TURP) (procedure #2);  Surgeon: Marissa Nestle, MD;  [facility];  Service: Urology;  Laterality: N/A;    No Known Allergies  Outpatient Encounter Medications as of 10/19/2018  Medication Sig  . atorvastatin (LIPITOR) 80 MG tablet Take 80 mg by mouth every evening.  . insulin aspart (NOVOLOG FLEXPEN) 100 UNIT/ML FlexPen Inject 6 Units into the skin 2 (two) times daily.  . Insulin Glargine (BASAGLAR KWIKPEN) 100 UNIT/ML SOPN Inject 16 Units into the skin at bedtime.   Marland Kitchen levothyroxine (SYNTHROID, LEVOTHROID) 88 MCG tablet Take 88 mcg  by mouth daily before breakfast.  . midodrine (PROAMATINE) 5 MG tablet Take 5 mg by mouth 3 (three) times a week. Take 5 mg on Tues, Thurs, and Sat at dialysis  . Multiple Vitamin (MULTIVITAMIN WITH MINERALS) TABS tablet Take 1 tablet by mouth every evening.   . Nutritional Supplements (NEPRO/CARBSTEADY PO) Take 1 Can by mouth 2 (two) times daily.  Marland Kitchen omeprazole (PRILOSEC) 40 MG capsule Take 40 mg by mouth at bedtime.   . polyethylene glycol (MIRALAX / GLYCOLAX) packet Take 17 g by mouth as needed for mild constipation.   . sevelamer carbonate (RENVELA) 800 MG tablet Take 800 mg by mouth daily.   . sevelamer carbonate (RENVELA)  800 MG tablet Take 1,600 mg by mouth 3 (three) times daily with meals.  . tamsulosin (FLOMAX) 0.4 MG CAPS capsule Take 0.4 mg by mouth every evening. Give 30 minutes after a meal  . torsemide (DEMADEX) 10 MG tablet Take 5 tablets (50 mg total) by mouth daily.   No facility-administered encounter medications on file as of 10/19/2018.      Review of Systems  Review of Systems  Constitutional: Negative for activity change, appetite change, chills, diaphoresis, fatigue and fever.  HENT: Negative for mouth sores, postnasal drip, rhinorrhea, sinus pain and sore throat.   Respiratory: Negative for apnea, cough, chest tightness, shortness of breath and wheezing.   Cardiovascular: Negative for chest pain, palpitations and leg swelling.  Gastrointestinal: Negative for abdominal distention, abdominal pain, constipation, diarrhea, nausea and vomiting.  Genitourinary: Negative for dysuria and frequency.  Musculoskeletal: Negative for arthralgias, joint swelling and myalgias.  Skin: Negative for rash.  Neurological: Negative for dizziness, syncope, weakness, light-headedness and numbness.  Psychiatric/Behavioral: Negative for behavioral problems, confusion and sleep disturbance.     Immunization History  Administered Date(s) Administered  . Influenza,inj,Quad PF,6+ Mos 12/26/2013, 07/09/2015  . Influenza-Unspecified 07/31/2017  . PPD Test 03/07/2015  . Pneumococcal Conjugate-13 07/31/2017  . Pneumococcal Polysaccharide-23 03/04/2015  . Tdap 08/22/2017   Pertinent  Health Maintenance Due  Topic Date Due  . URINE MICROALBUMIN  11/19/2018 (Originally 08/23/1956)  . HEMOGLOBIN A1C  01/22/2019  . FOOT EXAM  05/20/2019  . OPHTHALMOLOGY EXAM  09/01/2019  . COLONOSCOPY  04/28/2027  . INFLUENZA VACCINE  Completed  . PNA vac Low Risk Adult  Completed   Fall Risk  02/18/2018  Falls in the past year? No   Functional Status Survey:    Vitals:   10/19/18 1210  BP: 101/60  Pulse: 68  Resp: 19    Temp: 98.3 F (36.8 C)  TempSrc: Oral  SpO2: 97%   There is no height or weight on file to calculate BMI. Physical Exam Constitutional:      Appearance: He is well-developed.  HENT:     Head: Normocephalic.  Eyes:     Pupils: Pupils are equal, round, and reactive to light.  Neck:     Musculoskeletal: Neck supple.  Cardiovascular:     Rate and Rhythm: Normal rate.     Heart sounds: Normal heart sounds.  Pulmonary:     Effort: Pulmonary effort is normal. No respiratory distress.     Breath sounds: Normal breath sounds. No wheezing.  Abdominal:     General: Bowel sounds are normal. There is no distension.     Palpations: Abdomen is soft.     Tenderness: There is no abdominal tenderness. There is no rebound.  Skin:    General: Skin is warm and dry.  Neurological:  Mental Status: He is alert.     Comments: Is mostly Wheelchair Bound. He can walk with Gilford Rile and is independent in his transfers  Psychiatric:        Behavior: Behavior normal.        Thought Content: Thought content normal.     Labs reviewed: Recent Labs    01/22/18 0945 04/22/18 0718 04/26/18 1236 07/24/18 0737 08/13/18 0935 09/07/18 1356  NA 137 137 137 136 139 138  K 3.4* 4.3 4.8 4.7 5.2* 4.6  CL 94* 101 102 99 103 102  CO2 21* 25 21* 26  --   --   GLUCOSE 143* 271* 165* 215* 176* 210*  BUN 61* 51* 62* 29* 57* 39*  CREATININE 10.63* 9.14* 11.66* 8.42* 12.10* 9.80*  CALCIUM 8.2* 8.9 8.6* 8.9  --   --   PHOS 7.9*  --   --   --   --   --    Recent Labs    01/19/18 1608 01/22/18 0945  AST 22  --   ALT 18  --   ALKPHOS 152*  --   BILITOT 0.8  --   PROT 6.8  --   ALBUMIN 3.3* 3.2*   Recent Labs    01/19/18 0700 01/19/18 1608  04/22/18 0718 04/26/18 1236 07/24/18 0737 08/13/18 0935 09/07/18 1356  WBC 8.9 7.4   < > 6.6 8.7 5.4  --   --   NEUTROABS 6.8 5.3  --  3.5  --   --   --   --   HGB 11.6* 11.0*   < > 11.7* 11.0* 11.4* 12.9* 14.3  HCT 36.3* 34.8*   < > 36.0* 33.7* 34.6* 38.0*  42.0  MCV 106.1* 106.4*   < > 105.6* 105.0* 107.1*  --   --   PLT 145* 143*   < > 142* 125* 128*  --   --    < > = values in this interval not displayed.   Lab Results  Component Value Date   TSH 3.556 05/25/2018   Lab Results  Component Value Date   HGBA1C 8.5 (H) 07/24/2018   Lab Results  Component Value Date   CHOL 80 07/03/2018   HDL 19 (L) 07/03/2018   LDLCALC UNABLE TO CALCULATE IF TRIGLYCERIDE OVER 400 mg/dL 07/03/2018   TRIG 481 (H) 07/03/2018   CHOLHDL 4.2 07/03/2018    Significant Diagnostic Results in last 30 days:  No results found.  Assessment/Plan Type 2 diabetes mellitus His BS are running more then 200 most of the day Will Increase his Lantus to 20 units. Will increase his Bolus Novolog to 10 units and 14 if BS more then 300 Repeat A1C Eye Exam and Foot exam done in 10/19 Also Start him on Januvia   Family/ staff Communication:   Labs/tests ordered:

## 2018-10-21 DIAGNOSIS — N186 End stage renal disease: Secondary | ICD-10-CM | POA: Diagnosis not present

## 2018-10-21 DIAGNOSIS — N2581 Secondary hyperparathyroidism of renal origin: Secondary | ICD-10-CM | POA: Diagnosis not present

## 2018-10-21 DIAGNOSIS — Z992 Dependence on renal dialysis: Secondary | ICD-10-CM | POA: Diagnosis not present

## 2018-10-21 DIAGNOSIS — D631 Anemia in chronic kidney disease: Secondary | ICD-10-CM | POA: Diagnosis not present

## 2018-10-21 DIAGNOSIS — D509 Iron deficiency anemia, unspecified: Secondary | ICD-10-CM | POA: Diagnosis not present

## 2018-10-23 ENCOUNTER — Encounter (HOSPITAL_COMMUNITY)
Admission: AD | Admit: 2018-10-23 | Discharge: 2018-10-23 | Disposition: A | Discharge status: Home / Self Care | Payer: Medicare Other | Source: Skilled Nursing Facility | Attending: Internal Medicine | Admitting: Internal Medicine

## 2018-10-23 DIAGNOSIS — D509 Iron deficiency anemia, unspecified: Secondary | ICD-10-CM | POA: Diagnosis not present

## 2018-10-23 DIAGNOSIS — N2581 Secondary hyperparathyroidism of renal origin: Secondary | ICD-10-CM | POA: Diagnosis not present

## 2018-10-23 DIAGNOSIS — E1129 Type 2 diabetes mellitus with other diabetic kidney complication: Secondary | ICD-10-CM | POA: Insufficient documentation

## 2018-10-23 DIAGNOSIS — D631 Anemia in chronic kidney disease: Secondary | ICD-10-CM | POA: Diagnosis not present

## 2018-10-23 DIAGNOSIS — Z992 Dependence on renal dialysis: Secondary | ICD-10-CM | POA: Diagnosis not present

## 2018-10-23 DIAGNOSIS — N186 End stage renal disease: Secondary | ICD-10-CM | POA: Diagnosis not present

## 2018-10-23 LAB — CBC WITH DIFFERENTIAL/PLATELET
Abs Immature Granulocytes: 0.01 10*3/uL (ref 0.00–0.07)
Basophils Absolute: 0 10*3/uL (ref 0.0–0.1)
Basophils Relative: 1 %
Eosinophils Absolute: 0.2 10*3/uL (ref 0.0–0.5)
Eosinophils Relative: 3 %
HCT: 37.7 % — ABNORMAL LOW (ref 39.0–52.0)
Hemoglobin: 11.5 g/dL — ABNORMAL LOW (ref 13.0–17.0)
Immature Granulocytes: 0 %
Lymphocytes Relative: 38 %
Lymphs Abs: 2.1 10*3/uL (ref 0.7–4.0)
MCH: 33.2 pg (ref 26.0–34.0)
MCHC: 30.5 g/dL (ref 30.0–36.0)
MCV: 109 fL — ABNORMAL HIGH (ref 80.0–100.0)
Monocytes Absolute: 0.8 10*3/uL (ref 0.1–1.0)
Monocytes Relative: 15 %
Neutro Abs: 2.3 10*3/uL (ref 1.7–7.7)
Neutrophils Relative %: 43 %
Platelets: 145 10*3/uL — ABNORMAL LOW (ref 150–400)
RBC: 3.46 MIL/uL — ABNORMAL LOW (ref 4.22–5.81)
RDW: 15.1 % (ref 11.5–15.5)
WBC: 5.4 10*3/uL (ref 4.0–10.5)
nRBC: 0 % (ref 0.0–0.2)

## 2018-10-23 LAB — BASIC METABOLIC PANEL
Anion gap: 6 (ref 5–15)
BUN: 43 mg/dL — ABNORMAL HIGH (ref 8–23)
CO2: 24 mmol/L (ref 22–32)
Calcium: 8.9 mg/dL (ref 8.9–10.3)
Chloride: 113 mmol/L — ABNORMAL HIGH (ref 98–111)
Creatinine, Ser: 8.29 mg/dL — ABNORMAL HIGH (ref 0.61–1.24)
GFR calc Af Amer: 7 mL/min — ABNORMAL LOW (ref >60)
GFR calc non Af Amer: 6 mL/min — ABNORMAL LOW (ref >60)
Glucose, Bld: 150 mg/dL — ABNORMAL HIGH (ref 70–99)
Potassium: 5.3 mmol/L — ABNORMAL HIGH (ref 3.5–5.1)
Sodium: 143 mmol/L (ref 135–145)

## 2018-10-23 LAB — LIPID PANEL
Cholesterol: 86 mg/dL (ref 0–200)
HDL: 27 mg/dL — ABNORMAL LOW (ref >40)
LDL Cholesterol: 20 mg/dL (ref 0–99)
Total CHOL/HDL Ratio: 3.2 RATIO
Triglycerides: 197 mg/dL — ABNORMAL HIGH (ref <150)
VLDL: 39 mg/dL (ref 0–40)

## 2018-10-23 LAB — TSH: TSH: 2.666 u[IU]/mL (ref 0.350–4.500)

## 2018-10-23 LAB — HEMOGLOBIN A1C
Hgb A1c MFr Bld: 7.7 % — ABNORMAL HIGH (ref 4.8–5.6)
Mean Plasma Glucose: 174.29 mg/dL

## 2018-10-23 LAB — VITAMIN B12: Vitamin B-12: 540 pg/mL (ref 180–914)

## 2018-10-26 DIAGNOSIS — N2581 Secondary hyperparathyroidism of renal origin: Secondary | ICD-10-CM | POA: Diagnosis not present

## 2018-10-26 DIAGNOSIS — D509 Iron deficiency anemia, unspecified: Secondary | ICD-10-CM | POA: Diagnosis not present

## 2018-10-26 DIAGNOSIS — D631 Anemia in chronic kidney disease: Secondary | ICD-10-CM | POA: Diagnosis not present

## 2018-10-26 DIAGNOSIS — Z992 Dependence on renal dialysis: Secondary | ICD-10-CM | POA: Diagnosis not present

## 2018-10-26 DIAGNOSIS — N186 End stage renal disease: Secondary | ICD-10-CM | POA: Diagnosis not present

## 2018-10-28 DIAGNOSIS — N2581 Secondary hyperparathyroidism of renal origin: Secondary | ICD-10-CM | POA: Diagnosis not present

## 2018-10-28 DIAGNOSIS — D509 Iron deficiency anemia, unspecified: Secondary | ICD-10-CM | POA: Diagnosis not present

## 2018-10-28 DIAGNOSIS — Z992 Dependence on renal dialysis: Secondary | ICD-10-CM | POA: Diagnosis not present

## 2018-10-28 DIAGNOSIS — N186 End stage renal disease: Secondary | ICD-10-CM | POA: Diagnosis not present

## 2018-10-30 DIAGNOSIS — N2581 Secondary hyperparathyroidism of renal origin: Secondary | ICD-10-CM | POA: Diagnosis not present

## 2018-10-30 DIAGNOSIS — D509 Iron deficiency anemia, unspecified: Secondary | ICD-10-CM | POA: Diagnosis not present

## 2018-10-30 DIAGNOSIS — N186 End stage renal disease: Secondary | ICD-10-CM | POA: Diagnosis not present

## 2018-10-30 DIAGNOSIS — Z992 Dependence on renal dialysis: Secondary | ICD-10-CM | POA: Diagnosis not present

## 2018-11-02 DIAGNOSIS — D509 Iron deficiency anemia, unspecified: Secondary | ICD-10-CM | POA: Diagnosis not present

## 2018-11-02 DIAGNOSIS — N2581 Secondary hyperparathyroidism of renal origin: Secondary | ICD-10-CM | POA: Diagnosis not present

## 2018-11-02 DIAGNOSIS — N186 End stage renal disease: Secondary | ICD-10-CM | POA: Diagnosis not present

## 2018-11-02 DIAGNOSIS — Z992 Dependence on renal dialysis: Secondary | ICD-10-CM | POA: Diagnosis not present

## 2018-11-04 DIAGNOSIS — N2581 Secondary hyperparathyroidism of renal origin: Secondary | ICD-10-CM | POA: Diagnosis not present

## 2018-11-04 DIAGNOSIS — Z992 Dependence on renal dialysis: Secondary | ICD-10-CM | POA: Diagnosis not present

## 2018-11-04 DIAGNOSIS — N186 End stage renal disease: Secondary | ICD-10-CM | POA: Diagnosis not present

## 2018-11-04 DIAGNOSIS — D509 Iron deficiency anemia, unspecified: Secondary | ICD-10-CM | POA: Diagnosis not present

## 2018-11-06 DIAGNOSIS — D509 Iron deficiency anemia, unspecified: Secondary | ICD-10-CM | POA: Diagnosis not present

## 2018-11-06 DIAGNOSIS — Z992 Dependence on renal dialysis: Secondary | ICD-10-CM | POA: Diagnosis not present

## 2018-11-06 DIAGNOSIS — N186 End stage renal disease: Secondary | ICD-10-CM | POA: Diagnosis not present

## 2018-11-06 DIAGNOSIS — N2581 Secondary hyperparathyroidism of renal origin: Secondary | ICD-10-CM | POA: Diagnosis not present

## 2018-11-09 DIAGNOSIS — D509 Iron deficiency anemia, unspecified: Secondary | ICD-10-CM | POA: Diagnosis not present

## 2018-11-09 DIAGNOSIS — E119 Type 2 diabetes mellitus without complications: Secondary | ICD-10-CM | POA: Diagnosis not present

## 2018-11-09 DIAGNOSIS — N186 End stage renal disease: Secondary | ICD-10-CM | POA: Diagnosis not present

## 2018-11-09 DIAGNOSIS — N2581 Secondary hyperparathyroidism of renal origin: Secondary | ICD-10-CM | POA: Diagnosis not present

## 2018-11-09 DIAGNOSIS — Z992 Dependence on renal dialysis: Secondary | ICD-10-CM | POA: Diagnosis not present

## 2018-11-11 DIAGNOSIS — D509 Iron deficiency anemia, unspecified: Secondary | ICD-10-CM | POA: Diagnosis not present

## 2018-11-11 DIAGNOSIS — Z992 Dependence on renal dialysis: Secondary | ICD-10-CM | POA: Diagnosis not present

## 2018-11-11 DIAGNOSIS — N186 End stage renal disease: Secondary | ICD-10-CM | POA: Diagnosis not present

## 2018-11-11 DIAGNOSIS — N2581 Secondary hyperparathyroidism of renal origin: Secondary | ICD-10-CM | POA: Diagnosis not present

## 2018-11-11 IMAGING — RF DG KNEE 3 VIEWS*R*
1 series · 4 of 4 positions shown · non-contrast
Comparison: 11/20/2016

CLINICAL DATA: ORIF distal right femoral fracture

EXAM:
DG C-ARM 61-120 MIN; RIGHT KNEE - 3 VIEW

[Series 1: run · 4 of 4 slices shown]
[im 1/4]
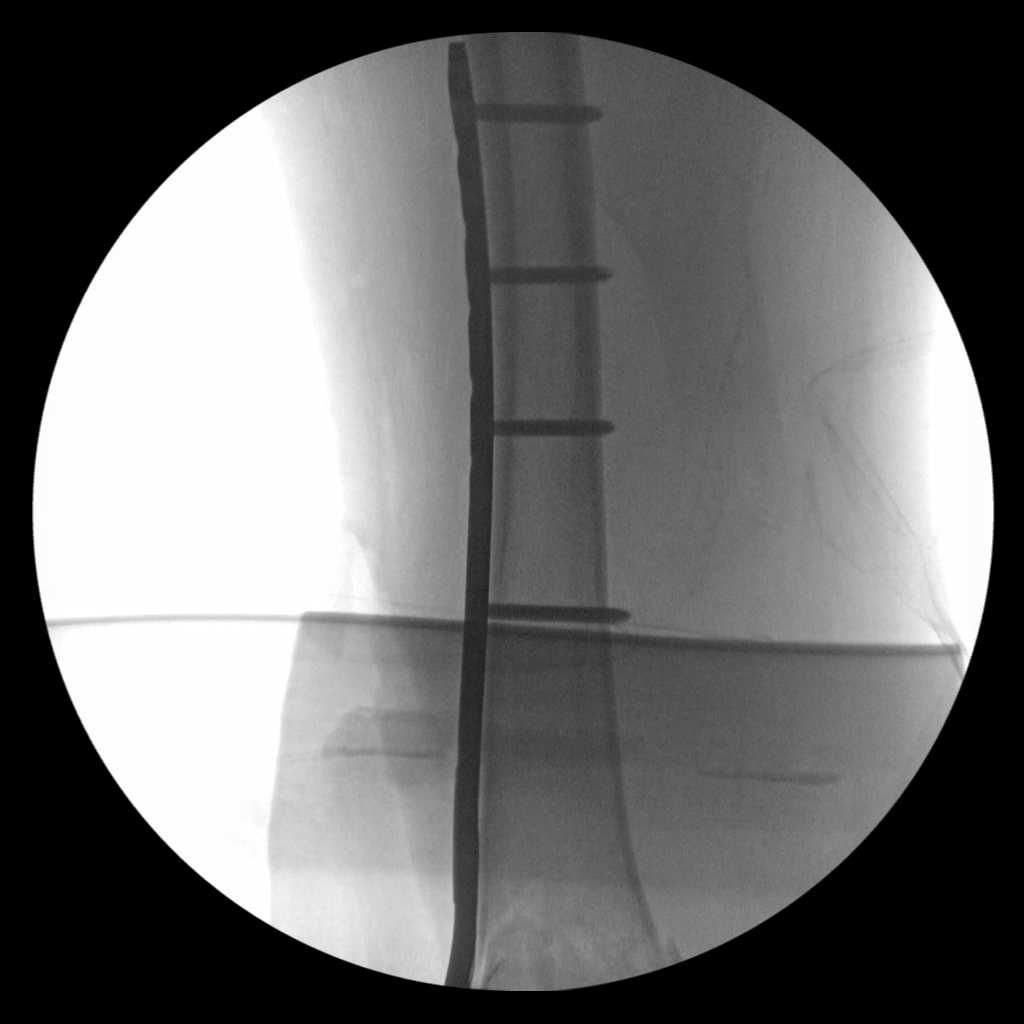
[im 2/4]
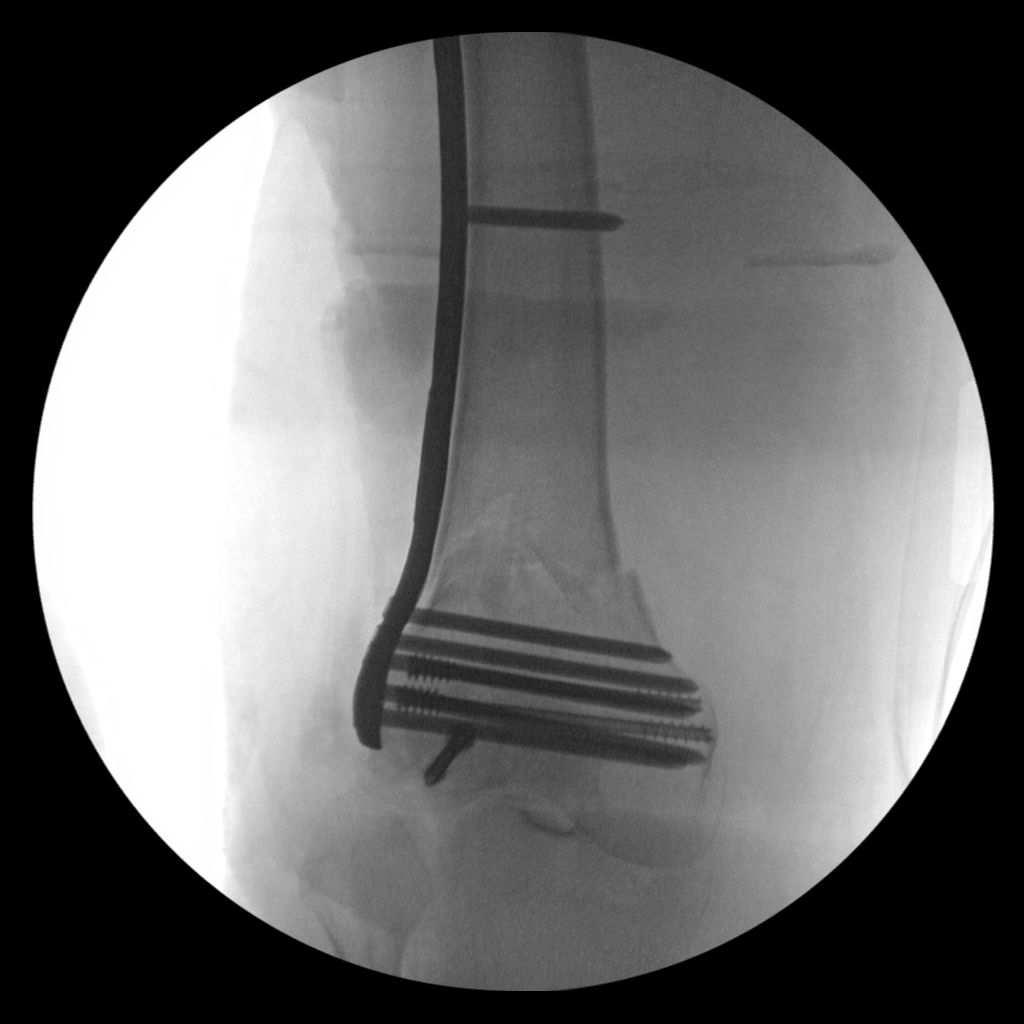
[im 3/4]
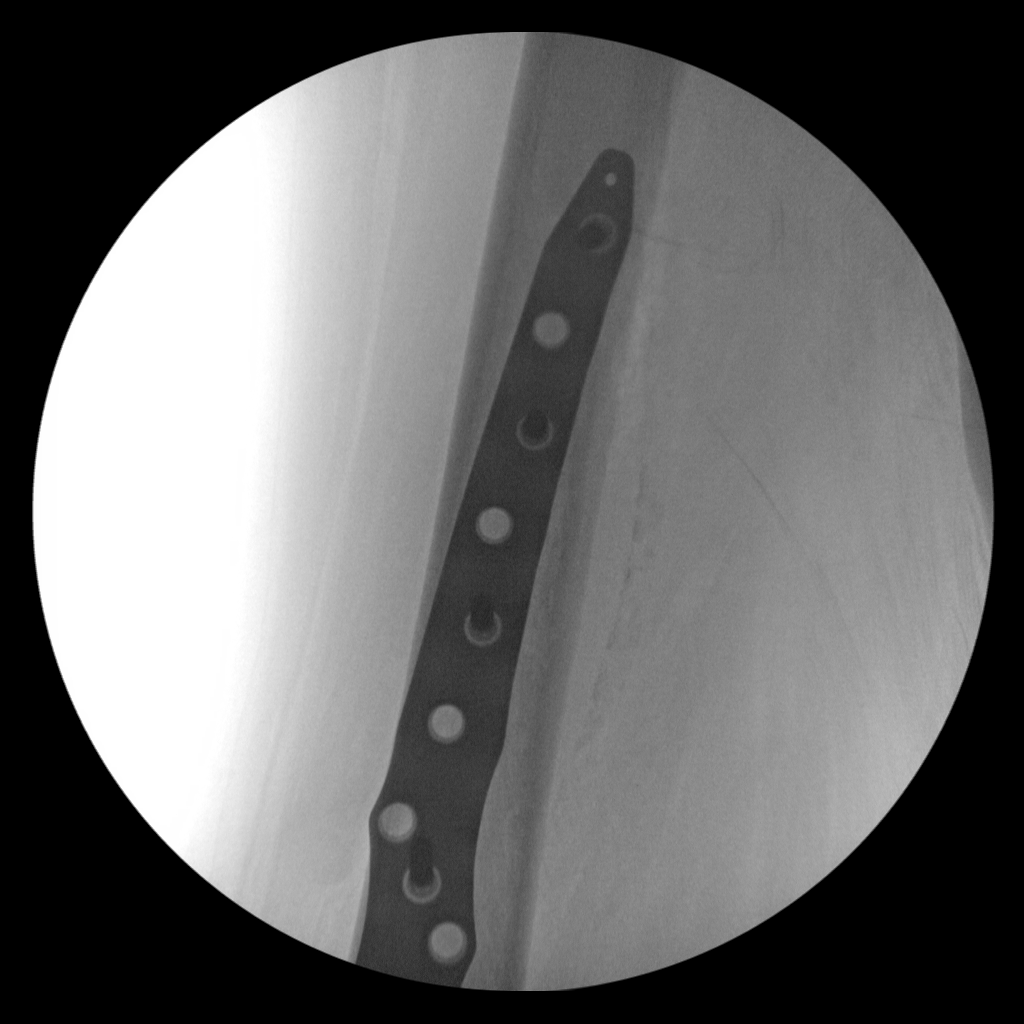
[im 4/4]
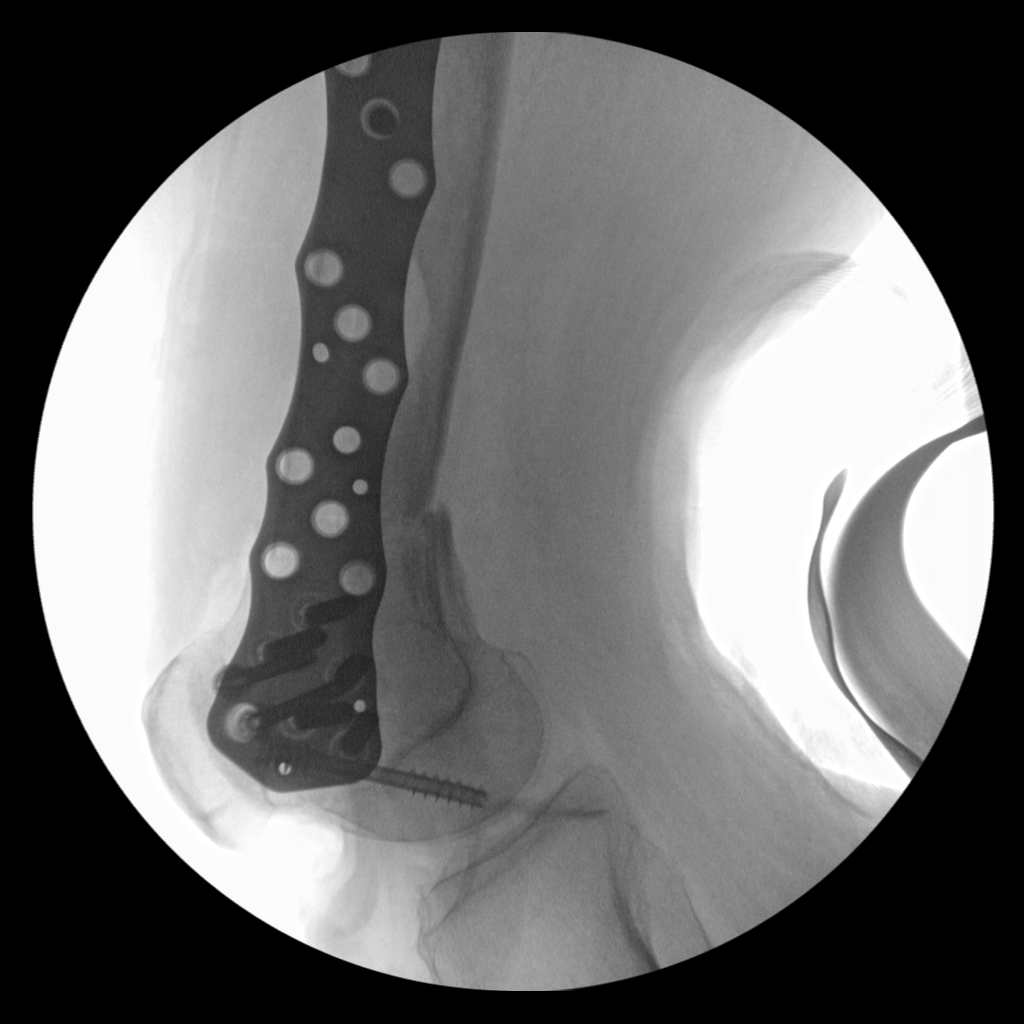

[4 of 4 positions shown; findings below may reference images not displayed]

FINDINGS: Plate and screw fixation across the distal right femoral fracture.
Near anatomic alignment. No hardware complicating feature.
IMPRESSION: Internal fixation across the distal right femoral fracture. No
visible complicating feature.

## 2018-11-13 DIAGNOSIS — N186 End stage renal disease: Secondary | ICD-10-CM | POA: Diagnosis not present

## 2018-11-13 DIAGNOSIS — Z992 Dependence on renal dialysis: Secondary | ICD-10-CM | POA: Diagnosis not present

## 2018-11-13 DIAGNOSIS — D509 Iron deficiency anemia, unspecified: Secondary | ICD-10-CM | POA: Diagnosis not present

## 2018-11-13 DIAGNOSIS — N2581 Secondary hyperparathyroidism of renal origin: Secondary | ICD-10-CM | POA: Diagnosis not present

## 2018-11-16 ENCOUNTER — Other Ambulatory Visit: Payer: Self-pay

## 2018-11-16 DIAGNOSIS — Z992 Dependence on renal dialysis: Secondary | ICD-10-CM | POA: Diagnosis not present

## 2018-11-16 DIAGNOSIS — N2581 Secondary hyperparathyroidism of renal origin: Secondary | ICD-10-CM | POA: Diagnosis not present

## 2018-11-16 DIAGNOSIS — N186 End stage renal disease: Secondary | ICD-10-CM | POA: Diagnosis not present

## 2018-11-16 DIAGNOSIS — D509 Iron deficiency anemia, unspecified: Secondary | ICD-10-CM | POA: Diagnosis not present

## 2018-11-16 NOTE — Progress Notes (Addendum)
HISTORY AND PHYSICAL     CC:  dialysis access Requesting Provider:  [name], MD  HPI: This is a 73 y.o. male here for evaluation for hemodialysis access.  He underwent a right BVT in May 2017 by Dr. Scot Dock.   In February, he underwent T&R of his fistula with bovine pericardial patch angioplasty of proximal fistula.    At that time, he was found to have intimal hyperplasia over a long length in the proximal fistula.  This was addressed with a bovine pericardial patch.  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  In pulling the Fogarty catheter through the fistula there were multiple areas of irregularity.  If this graft clots in the future Dr. Scot Dock would recommend placement of a tunneled dialysis catheter and a right upper arm graft.  In October 2019, he was seen by Dr. Oneida Alar for poorly functioning fistula right arm.  He was taken for a fistulogram that afternoon and was found to have a 4cm length narrowing of 80% mid right AV fistula and was angioplastied to < 20% residual stenosis.  He is here today referred by Dr. Lowanda Foster for low transonic rates on dialysis.  Pt denies any pain or numbness in his right hand.   The pt is left hand dominant.   Pt is on dialysis.    Pt is diabetic and on insulin.  He is on synthroid for hypothyroidism  If pt on Dialysis:   Dialysis days/center:  T/T/S @ [facility] with Dr. Lowanda Foster   Past Medical History:  Diagnosis Date  . Abnormal CT scan, kidney 10/06/2011  . Acute pyelonephritis 10/07/2011  . Anemia    normocytic  . Anxiety    mental retardation  . Bladder wall thickening 10/06/2011  . BPH (benign prostatic hypertrophy)   . Diabetes mellitus   . Dialysis patient [facility])    Tuesday, Thursday and Saturday,   . Edema     history of lower extremity edema  . GERD (gastroesophageal reflux disease)   . Heme positive stool   . Hydronephrosis   . Hyperkalemia   . Hyperlipidemia   . Hypernatremia   .  Hypertension   . Hypothyroidism   . Impaired speech   . 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 MR (mental retardation)   . Muscle weakness   . Obstructive uropathy   . 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 Poor historian poor historian  . Protein calorie malnutrition ([place])   . Pyelonephritis   . Renal failure (ARF), acute on chronic (HCC)   . Renal insufficiency    chronic history  . Sepsis ([place])   . Smoking   . Uremia   . Urinary retention   . UTI (lower urinary tract infection) 10/06/2011    Past Surgical History:  Procedure Laterality Date  . A/V FISTULAGRAM N/A 08/13/2018   Procedure: A/V FISTULAGRAM - Right Upper;  Surgeon: Elam Dutch, MD;  Location: [place] [facility];  Service: Cardiovascular;  Laterality: N/A;  . AV FISTULA PLACEMENT Left 07/06/2015   Procedure:  INSERTION LEFT ARM ARTERIOVENOUS GORTEX GRAFT;  Surgeon: Angelia Mould, MD;  Location: [place];  Service: Vascular;  Laterality: Left;  . AV FISTULA PLACEMENT Right 02/26/2016   Procedure: ARTERIOVENOUS (AV) FISTULA CREATION ;  Surgeon: Angelia Mould, MD;  Location: [place];  Service: Vascular;  Laterality: Right;  . [place] REMOVAL Left 10/09/2015   Procedure: REMOVAL OF ARTERIOVENOUS GORETEX GRAFT ([place]) Evacuation of Lymphocele, Vein  Patch angioplasty of brachial artery.;  Surgeon: Angelia Mould, MD;  Location: [place];  Service: Vascular;  Laterality: Left;  . BASCILIC VEIN TRANSPOSITION Right 02/26/2016   Procedure: Right BASCILIC VEIN TRANSPOSITION;  Surgeon: Angelia Mould, MD;  Location: [place];  Service: Vascular;  Laterality: Right;  . CIRCUMCISION N/A 01/04/2014   Procedure: CIRCUMCISION ADULT (procedure #1);  Surgeon: Marissa Nestle, MD;  Location: [facility];  Service: Urology;  Laterality: N/A;  . COLECTOMY N/A 05/04/2017   Procedure: TOTAL COLECTOMY;  Surgeon: Aviva Signs, MD;  Location: [facility];  Service: General;  Laterality: N/A;  . COLONOSCOPY N/A 04/27/2017   Procedure:  COLONOSCOPY;  Surgeon: Daneil Dolin, MD;  [facility];  Service: Endoscopy;  Laterality: N/A;  245  . CYSTOSCOPY W/ RETROGRADES Bilateral 06/29/2015   Procedure: CYSTOSCOPY, DILATION OF URETHRAL STRICTURE WITH BILATERAL RETROGRADE PYELOGRAM,SUPRAPUBIC TUBE CHANGE;  Surgeon: Festus Aloe, MD;  [facility];  Service: Urology;  Laterality: Bilateral;  . CYSTOSCOPY WITH URETHRAL DILATATION N/A 12/29/2013   Procedure: CYSTOSCOPY WITH URETHRAL DILATATION;  Surgeon: Marissa Nestle, MD;  Location: [facility];  Service: Urology;  Laterality: N/A;  . ESOPHAGOGASTRODUODENOSCOPY N/A 04/27/2017   Procedure: ESOPHAGOGASTRODUODENOSCOPY (EGD);  Surgeon: Daneil Dolin, MD;  [facility];  Service: Endoscopy;  Laterality: N/A;  . IR AV DIALY SHUNT INTRO NEEDLE/INTRACATH INITIAL W/PTA/IMG RIGHT Right 09/07/2018  . IR THROMBECTOMY AV FISTULA W/THROMBOLYSIS/PTA INC/SHUNT/IMG RIGHT Right 04/26/2018  . IR US GUIDE VASC ACCESS RIGHT  04/26/2018  . IR US GUIDE VASC ACCESS RIGHT  09/07/2018  . ORIF FEMUR FRACTURE Right 11/22/2016   Procedure: OPEN REDUCTION INTERNAL FIXATION (ORIF) DISTAL FEMUR FRACTURE;  Surgeon: Rod Can, MD;  Location: [place];  Service: Orthopedics;  Laterality: Right;  . PATCH ANGIOPLASTY Right 12/10/2017   Procedure: PATCH ANGIOPLASTY;  Surgeon: Angelia Mould, MD;  [facility];  Service: Vascular;  Laterality: Right;  . PERIPHERAL VASCULAR BALLOON ANGIOPLASTY  08/13/2018   Procedure: PERIPHERAL VASCULAR BALLOON ANGIOPLASTY;  Surgeon: Elam Dutch, MD;  Location: [place] [facility];  Service: Cardiovascular;;  right AV fistula   . PERIPHERAL VASCULAR CATHETERIZATION N/A 10/08/2015   Procedure: A/V Shuntogram;  Surgeon: Angelia Mould, MD;  Location: [place] [facility];  Service: Cardiovascular;  Laterality: N/A;  . THROMBECTOMY W/ EMBOLECTOMY Right 12/10/2017   Procedure: THROMBECTOMY REVISION RIGHT ARM  ARTERIOVENOUS FISTULA;  Surgeon: Angelia Mould, MD;  Location: [place];  Service: Vascular;  Laterality: Right;  . TRANSURETHRAL RESECTION OF PROSTATE N/A 01/04/2014   Procedure: TRANSURETHRAL RESECTION OF THE PROSTATE (TURP) (procedure #2);  Surgeon: Marissa Nestle, MD;  Location: [facility];  Service: Urology;  Laterality: N/A;    No Known Allergies  No current outpatient medications on file.   No current facility-administered medications for this visit.     Family History  Problem Relation Age of Onset  . Cancer Mother   . Colon cancer Neg Hx     Social History   Socioeconomic History  . Marital status: Single    Spouse name: Not on file  . Number of children: Not on file  . Years of education: Not on file  . Highest education level: Not on file  Occupational History  . Not on file  Social Needs  . Financial resource strain: Not hard at all  . Food insecurity:    Worry: Never true    Inability: Never true  . Transportation needs:  Medical: No    Non-medical: No  Tobacco Use  . Smoking status: Never Smoker  . Smokeless tobacco: Never Used  Substance and Sexual Activity  . Alcohol use: No  . Drug use: No  . Sexual activity: Never  Lifestyle  . Physical activity:    Days per week: 0 days    Minutes per session: Not on file  . Stress: Not at all  Relationships  . Social connections:    Talks on phone: More than three times a week    Gets together: More than three times a week    Attends religious service: Never    Active member of club or organization: No    Attends meetings of clubs or organizations: Never    Relationship status: Never married  . Intimate partner violence:    Fear of current or ex partner: No    Emotionally abused: No    Physically abused: No    Forced sexual activity: No  Other Topics Concern  . Not on file  Social History Narrative   Lives at nursing home.     ROS: [x]  Positive   [ ]  Negative   [ ]  All sytems reviewed and are negative  Cardiac: []  chest  pain/pressure []  SOB []  DOE  Vascular: []  pain in legs while walking []  pain in feet when lying flat [x]  hx of DVT []  swelling in legs  Pulmonary: []  asthma []  wheezing  Neurologic: []  weakness in []  arms []  legs []  numbness in []  arms []  legs [] difficulty speaking or slurred speech []  temporary loss of vision in one eye []  dizziness  Hematologic: []  bleeding problems  GI [x]  Hx GIB  GU: [x]  CKD/renal failure  [x]  HD---[]  M/W/F [x]  T/T/S  Psychiatric: []  hx of major depression  Integumentary: []  rashes []  ulcers  Constitutional: []  fever []  chills  PHYSICAL EXAMINATION:  Today's Vitals   11/17/18 1417  BP: 103/69  Pulse: 97  Resp: 20  Temp: 98.3 F (36.8 C)  SpO2: 99%  Weight: 215 lb (97.5 kg)  Height: 5\' 8"  (1.727 m)   Body mass index is 32.69 kg/m.    General:  WDWN male in NAD Gait: Wheelchair HENT: WNL Pulmonary: normal non-labored breathing Cardiac: regular, without  Murmurs without carotid bruits Abdomen: soft, NT, no masses Skin: without rashes, without ulcers  Vascular Exam/Pulses:   Right Left  Radial 2+ (normal) 2+ (normal)  Ulnar Unable to palpate  Unable to palpate    Extremities:  Easily palpable right radial pulse; the fistula does have a palpable thrill but seems weak. Musculoskeletal: no muscle wasting or atrophy  Neurologic: A&O X 3; Moving all extremities equally;  Speech is fluent/normal  Non-Invasive Vascular Imaging:   none    ASSESSMENT/PLAN: 73 y.o. male with ESRD here for evaluation of his hemodialysis access  -pt having low flow rates at dialysis.  He does not have any evidence of steal as he denies pain or numbness in his right hand.  He has palpable radial pulse bilaterally. -he did have a fistulogram in October with angioplasty.  Discussed with Dr. Oneida Alar and will plan for fistulogram but if he has issues with this fistula in the future, he may need new access. -pt is left handed.  - will plan for  fistulogram on non dialysis day -pt is not on anticoagulation   Leontine Locket, PA-C [facility] [phone]  Clinic MD:   Scot Dock

## 2018-11-17 ENCOUNTER — Encounter: Payer: Self-pay | Admitting: *Deleted

## 2018-11-17 ENCOUNTER — Other Ambulatory Visit: Payer: Self-pay

## 2018-11-17 ENCOUNTER — Ambulatory Visit (INDEPENDENT_AMBULATORY_CARE_PROVIDER_SITE_OTHER): Payer: Medicare Other | Admitting: Physician Assistant

## 2018-11-17 ENCOUNTER — Other Ambulatory Visit: Payer: Self-pay | Admitting: *Deleted

## 2018-11-17 ENCOUNTER — Inpatient Hospital Stay (HOSPITAL_COMMUNITY)
Admit: 2018-11-17 | Discharge: 2018-11-17 | Disposition: A | Discharge status: Home / Self Care | Payer: Medicare Other | Attending: Family | Admitting: Family

## 2018-11-17 VITALS — BP 103/69 | HR 97 | Temp 98.3°F | Resp 20 | Ht 68.0 in | Wt 215.0 lb

## 2018-11-17 DIAGNOSIS — N186 End stage renal disease: Secondary | ICD-10-CM

## 2018-11-17 DIAGNOSIS — Z992 Dependence on renal dialysis: Secondary | ICD-10-CM

## 2018-11-18 DIAGNOSIS — N2581 Secondary hyperparathyroidism of renal origin: Secondary | ICD-10-CM | POA: Diagnosis not present

## 2018-11-18 DIAGNOSIS — N186 End stage renal disease: Secondary | ICD-10-CM | POA: Diagnosis not present

## 2018-11-18 DIAGNOSIS — Z992 Dependence on renal dialysis: Secondary | ICD-10-CM | POA: Diagnosis not present

## 2018-11-18 DIAGNOSIS — D509 Iron deficiency anemia, unspecified: Secondary | ICD-10-CM | POA: Diagnosis not present

## 2018-11-20 DIAGNOSIS — D509 Iron deficiency anemia, unspecified: Secondary | ICD-10-CM | POA: Diagnosis not present

## 2018-11-20 DIAGNOSIS — Z992 Dependence on renal dialysis: Secondary | ICD-10-CM | POA: Diagnosis not present

## 2018-11-20 DIAGNOSIS — N186 End stage renal disease: Secondary | ICD-10-CM | POA: Diagnosis not present

## 2018-11-20 DIAGNOSIS — N2581 Secondary hyperparathyroidism of renal origin: Secondary | ICD-10-CM | POA: Diagnosis not present

## 2018-11-22 ENCOUNTER — Ambulatory Visit (HOSPITAL_COMMUNITY)
Admission: RE | Admit: 2018-11-22 | Discharge: 2018-11-22 | Disposition: A | Discharge status: Home / Self Care | Payer: Medicare Other | Source: Ambulatory Visit | Attending: Vascular Surgery | Admitting: Vascular Surgery

## 2018-11-22 ENCOUNTER — Other Ambulatory Visit: Payer: Self-pay

## 2018-11-22 ENCOUNTER — Inpatient Hospital Stay
Admission: RE | Admit: 2018-11-22 | Discharge: 2018-11-25 | Disposition: A | Discharge status: Other Acute Inpatient Hospital | Payer: Medicare Other | Source: Ambulatory Visit | Attending: Internal Medicine | Admitting: Internal Medicine

## 2018-11-22 ENCOUNTER — Encounter (HOSPITAL_COMMUNITY)
Admission: RE | Disposition: A | Discharge status: Home / Self Care | Payer: Self-pay | Source: Ambulatory Visit | Attending: Vascular Surgery

## 2018-11-22 DIAGNOSIS — I120 Hypertensive chronic kidney disease with stage 5 chronic kidney disease or end stage renal disease: Secondary | ICD-10-CM | POA: Diagnosis not present

## 2018-11-22 DIAGNOSIS — Y841 Kidney dialysis as the cause of abnormal reaction of the patient, or of later complication, without mention of misadventure at the time of the procedure: Secondary | ICD-10-CM | POA: Insufficient documentation

## 2018-11-22 DIAGNOSIS — Z992 Dependence on renal dialysis: Secondary | ICD-10-CM | POA: Insufficient documentation

## 2018-11-22 DIAGNOSIS — Z794 Long term (current) use of insulin: Secondary | ICD-10-CM | POA: Insufficient documentation

## 2018-11-22 DIAGNOSIS — N186 End stage renal disease: Secondary | ICD-10-CM | POA: Insufficient documentation

## 2018-11-22 DIAGNOSIS — K219 Gastro-esophageal reflux disease without esophagitis: Secondary | ICD-10-CM | POA: Insufficient documentation

## 2018-11-22 DIAGNOSIS — E039 Hypothyroidism, unspecified: Secondary | ICD-10-CM | POA: Diagnosis not present

## 2018-11-22 DIAGNOSIS — T82510A Breakdown (mechanical) of surgically created arteriovenous fistula, initial encounter: Secondary | ICD-10-CM | POA: Diagnosis not present

## 2018-11-22 DIAGNOSIS — Z79890 Hormone replacement therapy (postmenopausal): Secondary | ICD-10-CM | POA: Insufficient documentation

## 2018-11-22 DIAGNOSIS — E785 Hyperlipidemia, unspecified: Secondary | ICD-10-CM | POA: Insufficient documentation

## 2018-11-22 DIAGNOSIS — F79 Unspecified intellectual disabilities: Secondary | ICD-10-CM | POA: Insufficient documentation

## 2018-11-22 DIAGNOSIS — F419 Anxiety disorder, unspecified: Secondary | ICD-10-CM | POA: Insufficient documentation

## 2018-11-22 DIAGNOSIS — E1122 Type 2 diabetes mellitus with diabetic chronic kidney disease: Secondary | ICD-10-CM | POA: Insufficient documentation

## 2018-11-22 DIAGNOSIS — T82898A Other specified complication of vascular prosthetic devices, implants and grafts, initial encounter: Secondary | ICD-10-CM | POA: Diagnosis not present

## 2018-11-22 HISTORY — PX: PERIPHERAL VASCULAR BALLOON ANGIOPLASTY: CATH118281

## 2018-11-22 HISTORY — PX: A/V FISTULAGRAM: CATH118298

## 2018-11-22 LAB — BASIC METABOLIC PANEL
Anion gap: 13 (ref 5–15)
BUN: 40 mg/dL — ABNORMAL HIGH (ref 8–23)
CO2: 26 mmol/L (ref 22–32)
Calcium: 9.1 mg/dL (ref 8.9–10.3)
Chloride: 99 mmol/L (ref 98–111)
Creatinine, Ser: 9.66 mg/dL — ABNORMAL HIGH (ref 0.61–1.24)
GFR calc Af Amer: 6 mL/min — ABNORMAL LOW (ref >60)
GFR calc non Af Amer: 5 mL/min — ABNORMAL LOW (ref >60)
Glucose, Bld: 152 mg/dL — ABNORMAL HIGH (ref 70–99)
Potassium: 3.9 mmol/L (ref 3.5–5.1)
Sodium: 138 mmol/L (ref 135–145)

## 2018-11-22 LAB — GLUCOSE, CAPILLARY
Glucose-Capillary: 163 mg/dL — ABNORMAL HIGH (ref 70–99)
Glucose-Capillary: 97 mg/dL (ref 70–99)
Glucose-Capillary: 94 mg/dL (ref 70–99)

## 2018-11-22 LAB — CBC
HCT: 34.9 % — ABNORMAL LOW (ref 39.0–52.0)
Hemoglobin: 11 g/dL — ABNORMAL LOW (ref 13.0–17.0)
MCH: 32.7 pg (ref 26.0–34.0)
MCHC: 31.5 g/dL (ref 30.0–36.0)
MCV: 103.9 fL — ABNORMAL HIGH (ref 80.0–100.0)
Platelets: 102 10*3/uL — ABNORMAL LOW (ref 150–400)
RBC: 3.36 MIL/uL — ABNORMAL LOW (ref 4.22–5.81)
RDW: 13.2 % (ref 11.5–15.5)
WBC: 5.6 10*3/uL (ref 4.0–10.5)
nRBC: 0 % (ref 0.0–0.2)

## 2018-11-22 SURGERY — A/V FISTULAGRAM
Anesthesia: LOCAL

## 2018-11-22 MED ORDER — LIDOCAINE HCL (PF) 1 % IJ SOLN
INTRAMUSCULAR | Status: DC | PRN
  Administered 2018-11-22 (×2): 2 mL

## 2018-11-22 MED ORDER — HEPARIN (PORCINE) IN NACL 1000-0.9 UT/500ML-% IV SOLN
INTRAVENOUS | Status: DC | PRN
  Administered 2018-11-22: 500 mL

## 2018-11-22 MED ORDER — SODIUM CHLORIDE 0.9% FLUSH: 3.0000 mL | INTRAVENOUS | Status: DC | PRN

## 2018-11-22 MED ORDER — IODIXANOL 320 MG/ML IV SOLN
INTRAVENOUS | Status: DC | PRN
  Administered 2018-11-22: 60 mL via INTRAVENOUS

## 2018-11-22 MED ORDER — SODIUM CHLORIDE 0.9% FLUSH: 3.0000 mL | Freq: Two times a day (BID) | INTRAVENOUS | Status: DC

## 2018-11-22 MED ORDER — SODIUM CHLORIDE 0.9 % IV SOLN
INTRAVENOUS | Status: DC
  Administered 2018-11-22: 11:00:00 via INTRAVENOUS

## 2018-11-22 MED ORDER — SODIUM CHLORIDE 0.9 % IV SOLN: 250.0000 mL | INTRAVENOUS | Status: DC | PRN

## 2018-11-22 SURGICAL SUPPLY — 14 items
BAG SNAP BAND KOVER 36X36 (MISCELLANEOUS) ×3 IMPLANT
BALLN MUSTANG 8X80X75 (BALLOONS) ×3
BALLOON MUSTANG 8X80X75 (BALLOONS) ×2 IMPLANT
COVER DOME SNAP 22 D (MISCELLANEOUS) ×3 IMPLANT
KIT ENCORE 26 ADVANTAGE (KITS) ×3 IMPLANT
KIT MICROPUNCTURE NIT STIFF (SHEATH) ×3 IMPLANT
PROTECTION STATION PRESSURIZED (MISCELLANEOUS) ×3
SHEATH PINNACLE R/O II 7F 4CM (SHEATH) ×6 IMPLANT
SHEATH PROBE COVER 6X72 (BAG) ×3 IMPLANT
STATION PROTECTION PRESSURIZED (MISCELLANEOUS) ×2 IMPLANT
STOPCOCK MORSE 400PSI 3WAY (MISCELLANEOUS) ×3 IMPLANT
TRAY PV CATH (CUSTOM PROCEDURE TRAY) ×3 IMPLANT
TUBING CIL FLEX 10 FLL-RA (TUBING) ×3 IMPLANT
WIRE BENTSON .035X145CM (WIRE) ×3 IMPLANT

## 2018-11-22 NOTE — Discharge Instructions (Signed)

## 2018-11-22 NOTE — H&P (Signed)
   History and Physical Update  The patient was interviewed and re-examined.  [name] previous History and Physical has been reviewed and is unchanged from recent office visit. Plan for right arm fistulogram with possible intervention. If cannot be intervened upon will need consideration of catheter and right arm avg.   [name] C. Donzetta Matters, MD [facility] [place] Office: [phone] Pager: [phone]   11/22/2018, 10:32 AM

## 2018-11-22 NOTE — Op Note (Signed)
    Patient name: Zachary [name] MRN: [id number] DOB: 12-01-1945 Sex: male  11/22/2018 Pre-operative Diagnosis: End-stage renal disease, malfunction right arm AV fistula Post-operative diagnosis:  Same Surgeon:  Eda Paschal. Donzetta Matters, MD Procedure Performed: 1.  Ultrasound-guided cannulation right arm AV fistula 2.  Right upper extremity fistulogram 3.  Balloon angioplasty of AV fistula with 8 x 8 mm plain balloon  Indications: 73 year old male with history end-stage renal disease on dialysis via second stage right upper extremity basilic vein fistula.  He has had open and endovascular intervention on this fistula continues to have difficulty with cannulation.  He is now indicated for fistulogram possible intervention.  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  The anastomosis by ultrasound appears to have some disease with sclerosis.  There is a tight stenosis in the fistula proximally 80% and tandem areas.  At completion there is less than 50% and there is a good thrill in the fistula.  Unfortunately I do not think given the multiple areas of disease in this fistula is going to last long-term and would be best converted to a graft that could be done as an interposition.  He will need a tunnel catheter to rest the fistula at that time.  We will get this set up on an outpatient basis on a nondialysis day.   Procedure:  The patient was identified in the holding area and taken to room 8.  The patient was then placed supine on the table and prepped and draped in the usual sterile fashion.  A time out was called.  Ultrasound was used to evaluate the right arm AV fistula the area was anesthetized 1% lidocaine.  We cannulated this with direct visualization with micropuncture needle followed by wire sheath.  An image was saved the permanent record.  Right upper extremity fistulogram was performed.  We placed a Bentson wire exchanged for 7 French sheath.  We performed balloon angioplasty of  the tight tandem stenoses.  With balloon inflated we perform retrograde angiogram but unfortunately could not see the anastomosis very well.  By ultrasound the anastomosis was heavily diseased at least 50%.  Completion angiogram demonstrated residual flow less than 50% stenosis but did not completely resolve it.     Given the long history of disease with this fistula I elected not to intervene further and I think stenting would be detrimental to further open intervention as well as cannulations.  I will schedule him for tunneled dialysis catheter and graft of his right upper extremity the near future.  I discussed with him and he demonstrates good understanding.  Contrast: 60 cc   Chrishonda Hesch C. Donzetta Matters, MD [facility] [place] Office: [phone] Pager: [phone]

## 2018-11-23 ENCOUNTER — Encounter (HOSPITAL_COMMUNITY): Payer: Self-pay | Admitting: Vascular Surgery

## 2018-11-23 ENCOUNTER — Other Ambulatory Visit: Payer: Self-pay | Admitting: *Deleted

## 2018-11-23 ENCOUNTER — Encounter: Payer: Self-pay | Admitting: *Deleted

## 2018-11-23 DIAGNOSIS — N186 End stage renal disease: Secondary | ICD-10-CM | POA: Diagnosis not present

## 2018-11-23 DIAGNOSIS — Z992 Dependence on renal dialysis: Secondary | ICD-10-CM | POA: Diagnosis not present

## 2018-11-23 DIAGNOSIS — N2581 Secondary hyperparathyroidism of renal origin: Secondary | ICD-10-CM | POA: Diagnosis not present

## 2018-11-23 DIAGNOSIS — D509 Iron deficiency anemia, unspecified: Secondary | ICD-10-CM | POA: Diagnosis not present

## 2018-11-24 NOTE — Progress Notes (Signed)
Pro-op instructions faxed to pt nurse, Solmon Ice, LPN, at [facility]. Nurse confirmed receipt of fax and verbalized understanding of all pre-op instructions. Please complete pt assessment on DOS.

## 2018-11-24 NOTE — Pre-Procedure Instructions (Signed)
Zachary [name]  11/24/2018     No Pharmacies Listed   Your procedure is scheduled on Thursday, November 25, 2018  Report to [facility] Admitting at 8:00 A.M. (per MD)  Call this number if you have problems the morning of surgery:  [phone]   Remember: Brush your teeth the morning of surgery with your regular toothpaste  Do not eat or drink after midnight.  Take these medicines the morning of surgery with A SIP OF WATER:  levothyroxine (SYNTHROID), midodrine (PROAMATINE)  Stop taking vitamins, fish oil and herbal medications. Do not take any NSAIDs ie: Ibuprofen, Advil, Naproxen ( Aleve), Motrin, BC and Goody Powder.    How to Manage Your Diabetes Before and After Surgery  Why is it important to control my blood sugar before and after surgery? . Improving blood sugar levels before and after surgery helps healing and can limit problems. . A way of improving blood sugar control is eating a healthy diet by: o  Eating less sugar and carbohydrates o  Increasing activity/exercise o  Talking with your doctor about reaching your blood sugar goals . High blood sugars (greater than 180 mg/dL) can raise your risk of infections and slow your recovery, so you will need to focus on controlling your diabetes during the weeks before surgery. . Make sure that the doctor who takes care of your diabetes knows about your planned surgery including the date and location.  How do I manage my blood sugar before surgery? . Check your blood sugar at least 4 times a day, starting 2 days before surgery, to make sure that the level is not too high or low. o Check your blood sugar the morning of your surgery when you wake up and every 2 hours until you get to the Short Stay unit. . If your blood sugar is less than 70 mg/dL, you will need to treat for low blood sugar: o Do not take insulin. o Treat a low blood sugar (less than 70 mg/dL) with  cup of clear juice (cranberry or apple), 4 glucose  tablets, OR glucose gel. Recheck blood sugar in 15 minutes after treatment (to make sure it is greater than 70 mg/dL). If your blood sugar is not greater than 70 mg/dL on recheck, call [phone] o  for further instructions. . Report your blood sugar to the short stay nurse when you get to Short Stay.  . If you are admitted to the hospital after surgery: o Your blood sugar will be checked by the staff and you will probably be given insulin after surgery (instead of oral diabetes medicines) to make sure you have good blood sugar levels. o The goal for blood sugar control after surgery is 80-180 mg/dL  WHAT DO I DO ABOUT MY DIABETES MEDICATION?   Marland Kitchen Do not take oral diabetes medicines (pills) the morning of surgery such as [place], take 10 units of Insulin Glargine (BASAGLAR KWIKPEN)   . If your CBG is greater than 220 mg/dL, you may take  of your sliding scale (correction) dose of insulin.  Reviewed and Endorsed by [facility] Patient Education Committee, August 2015   Do not wear jewelry, make-up or nail polish.  Do not wear lotions, powders, or perfumes, or deodorant.  Do not shave 48 hours prior to surgery.  Men may shave face and neck.  Do not bring valuables to the hospital.  [facility] is not responsible for any belongings or  valuables.  Contacts, dentures or bridgework may not be worn into surgery.   For patients admitted to the hospital, discharge time will be determined by your treatment team.  Patients discharged the day of surgery will not be allowed to drive home.  Please read over the following fact sheets that you were given.

## 2018-11-25 ENCOUNTER — Encounter (HOSPITAL_COMMUNITY)
Admission: RE | Disposition: A | Discharge status: Skilled Nursing Facility | Payer: Self-pay | Source: Home / Self Care | Attending: Vascular Surgery

## 2018-11-25 ENCOUNTER — Ambulatory Visit (HOSPITAL_COMMUNITY)
Admission: RE | Admit: 2018-11-25 | Discharge: 2018-11-25 | Disposition: A | Discharge status: Skilled Nursing Facility | Payer: Medicare Other | Attending: Vascular Surgery | Admitting: Vascular Surgery

## 2018-11-25 ENCOUNTER — Other Ambulatory Visit: Payer: Self-pay

## 2018-11-25 ENCOUNTER — Ambulatory Visit (HOSPITAL_COMMUNITY): Payer: Medicare Other | Admitting: Anesthesiology

## 2018-11-25 ENCOUNTER — Ambulatory Visit (HOSPITAL_COMMUNITY): Payer: Medicare Other

## 2018-11-25 ENCOUNTER — Encounter (HOSPITAL_COMMUNITY): Payer: Self-pay | Admitting: Surgery

## 2018-11-25 ENCOUNTER — Inpatient Hospital Stay
Admission: RE | Admit: 2018-11-25 | Discharge: 2019-09-16 | Disposition: A | Discharge status: Nursing Home (Includes ICF) | Payer: Medicare Other | Source: Ambulatory Visit | Attending: Internal Medicine | Admitting: Internal Medicine

## 2018-11-25 DIAGNOSIS — Z992 Dependence on renal dialysis: Principal | ICD-10-CM

## 2018-11-25 DIAGNOSIS — K219 Gastro-esophageal reflux disease without esophagitis: Secondary | ICD-10-CM | POA: Insufficient documentation

## 2018-11-25 DIAGNOSIS — Z79899 Other long term (current) drug therapy: Secondary | ICD-10-CM | POA: Diagnosis not present

## 2018-11-25 DIAGNOSIS — I120 Hypertensive chronic kidney disease with stage 5 chronic kidney disease or end stage renal disease: Secondary | ICD-10-CM | POA: Insufficient documentation

## 2018-11-25 DIAGNOSIS — N186 End stage renal disease: Principal | ICD-10-CM

## 2018-11-25 DIAGNOSIS — Z7982 Long term (current) use of aspirin: Secondary | ICD-10-CM | POA: Insufficient documentation

## 2018-11-25 DIAGNOSIS — T82898A Other specified complication of vascular prosthetic devices, implants and grafts, initial encounter: Secondary | ICD-10-CM | POA: Diagnosis not present

## 2018-11-25 DIAGNOSIS — E1122 Type 2 diabetes mellitus with diabetic chronic kidney disease: Secondary | ICD-10-CM | POA: Diagnosis not present

## 2018-11-25 DIAGNOSIS — Z86718 Personal history of other venous thrombosis and embolism: Secondary | ICD-10-CM | POA: Insufficient documentation

## 2018-11-25 DIAGNOSIS — Z79890 Hormone replacement therapy (postmenopausal): Secondary | ICD-10-CM | POA: Insufficient documentation

## 2018-11-25 DIAGNOSIS — F79 Unspecified intellectual disabilities: Secondary | ICD-10-CM | POA: Insufficient documentation

## 2018-11-25 DIAGNOSIS — E785 Hyperlipidemia, unspecified: Secondary | ICD-10-CM | POA: Insufficient documentation

## 2018-11-25 DIAGNOSIS — Z794 Long term (current) use of insulin: Secondary | ICD-10-CM | POA: Insufficient documentation

## 2018-11-25 DIAGNOSIS — E039 Hypothyroidism, unspecified: Secondary | ICD-10-CM | POA: Insufficient documentation

## 2018-11-25 DIAGNOSIS — Z4901 Encounter for fitting and adjustment of extracorporeal dialysis catheter: Secondary | ICD-10-CM | POA: Diagnosis not present

## 2018-11-25 DIAGNOSIS — Y832 Surgical operation with anastomosis, bypass or graft as the cause of abnormal reaction of the patient, or of later complication, without mention of misadventure at the time of the procedure: Secondary | ICD-10-CM | POA: Insufficient documentation

## 2018-11-25 DIAGNOSIS — T8241XA Breakdown (mechanical) of vascular dialysis catheter, initial encounter: Secondary | ICD-10-CM | POA: Diagnosis not present

## 2018-11-25 HISTORY — PX: AV FISTULA PLACEMENT: SHX1204

## 2018-11-25 HISTORY — PX: INSERTION OF DIALYSIS CATHETER: SHX1324

## 2018-11-25 LAB — GLUCOSE, CAPILLARY
Glucose-Capillary: 100 mg/dL — ABNORMAL HIGH (ref 70–99)
Glucose-Capillary: 135 mg/dL — ABNORMAL HIGH (ref 70–99)

## 2018-11-25 LAB — SURGICAL PCR SCREEN
MRSA, PCR: NEGATIVE
Staphylococcus aureus: NEGATIVE

## 2018-11-25 LAB — POCT I-STAT 4, (NA,K, GLUC, HGB,HCT)
Glucose, Bld: 116 mg/dL — ABNORMAL HIGH (ref 70–99)
HCT: 33 % — ABNORMAL LOW (ref 39.0–52.0)
Hemoglobin: 11.2 g/dL — ABNORMAL LOW (ref 13.0–17.0)
Potassium: 4.1 mmol/L (ref 3.5–5.1)
Sodium: 142 mmol/L (ref 135–145)

## 2018-11-25 SURGERY — INSERTION OF ARTERIOVENOUS (AV) GORE-TEX GRAFT ARM
Anesthesia: General | Site: Arm Upper | Laterality: Right

## 2018-11-25 MED ORDER — ONDANSETRON HCL 4 MG/2ML IJ SOLN
INTRAMUSCULAR | Status: DC | PRN
  Administered 2018-11-25: 4 mg via INTRAVENOUS

## 2018-11-25 MED ORDER — VANCOMYCIN HCL IN DEXTROSE 1-5 GM/200ML-% IV SOLN
INTRAVENOUS | Status: AC
  Filled 2018-11-25: qty 200

## 2018-11-25 MED ORDER — CEFAZOLIN SODIUM-DEXTROSE 2-4 GM/100ML-% IV SOLN
INTRAVENOUS | Status: AC
  Filled 2018-11-25: qty 100

## 2018-11-25 MED ORDER — DEXAMETHASONE SODIUM PHOSPHATE 4 MG/ML IJ SOLN
INTRAMUSCULAR | Status: DC | PRN
  Administered 2018-11-25: 5 mg via INTRAVENOUS

## 2018-11-25 MED ORDER — FENTANYL CITRATE (PF) 100 MCG/2ML IJ SOLN: 25.0000 ug | INTRAMUSCULAR | Status: DC | PRN

## 2018-11-25 MED ORDER — ONDANSETRON HCL 4 MG/2ML IJ SOLN: 4.0000 mg | Freq: Once | INTRAMUSCULAR | Status: DC | PRN

## 2018-11-25 MED ORDER — VANCOMYCIN HCL IN DEXTROSE 1-5 GM/200ML-% IV SOLN: 1000.0000 mg | INTRAVENOUS | Status: DC

## 2018-11-25 MED ORDER — PROPOFOL 10 MG/ML IV BOLUS
INTRAVENOUS | Status: DC | PRN
  Administered 2018-11-25: 150 mg via INTRAVENOUS
  Administered 2018-11-25 (×2): 50 mg via INTRAVENOUS

## 2018-11-25 MED ORDER — PROPOFOL 10 MG/ML IV BOLUS
INTRAVENOUS | Status: AC
  Filled 2018-11-25: qty 20

## 2018-11-25 MED ORDER — FENTANYL CITRATE (PF) 250 MCG/5ML IJ SOLN
INTRAMUSCULAR | Status: AC
  Filled 2018-11-25: qty 5

## 2018-11-25 MED ORDER — SODIUM CHLORIDE 0.9 % IV SOLN
INTRAVENOUS | Status: DC | PRN
  Administered 2018-11-25: 11:00:00

## 2018-11-25 MED ORDER — SODIUM CHLORIDE 0.9 % IV SOLN
INTRAVENOUS | Status: DC | PRN
  Administered 2018-11-25: 50 ug/min via INTRAVENOUS

## 2018-11-25 MED ORDER — LIDOCAINE 2% (20 MG/ML) 5 ML SYRINGE
INTRAMUSCULAR | Status: DC | PRN
  Administered 2018-11-25: 40 mg via INTRAVENOUS
  Administered 2018-11-25: 60 mg via INTRAVENOUS

## 2018-11-25 MED ORDER — HYDROCODONE-ACETAMINOPHEN 5-325 MG PO TABS: 1.0000 | ORAL_TABLET | ORAL | 0 refills | Status: DC | PRN

## 2018-11-25 MED ORDER — CEFAZOLIN SODIUM-DEXTROSE 2-4 GM/100ML-% IV SOLN
2.0000 g | INTRAVENOUS | Status: AC
  Administered 2018-11-25: 2 g via INTRAVENOUS

## 2018-11-25 MED ORDER — HEPARIN SODIUM (PORCINE) 1000 UNIT/ML IJ SOLN
INTRAMUSCULAR | Status: AC
  Filled 2018-11-25: qty 1

## 2018-11-25 MED ORDER — FENTANYL CITRATE (PF) 100 MCG/2ML IJ SOLN
INTRAMUSCULAR | Status: DC | PRN
  Administered 2018-11-25: 50 ug via INTRAVENOUS
  Administered 2018-11-25: 25 ug via INTRAVENOUS
  Administered 2018-11-25 (×2): 50 ug via INTRAVENOUS

## 2018-11-25 MED ORDER — SODIUM CHLORIDE 0.9 % IV SOLN
INTRAVENOUS | Status: AC
  Filled 2018-11-25: qty 1.2

## 2018-11-25 MED ORDER — 0.9 % SODIUM CHLORIDE (POUR BTL) OPTIME
TOPICAL | Status: DC | PRN
  Administered 2018-11-25: 1000 mL

## 2018-11-25 MED ORDER — SODIUM CHLORIDE 0.9 % IV SOLN
INTRAVENOUS | Status: DC
  Administered 2018-11-25 (×2): via INTRAVENOUS

## 2018-11-25 MED ORDER — HEPARIN SODIUM (PORCINE) 1000 UNIT/ML IJ SOLN
INTRAMUSCULAR | Status: DC | PRN
  Administered 2018-11-25: 3000 [IU]

## 2018-11-25 MED ORDER — LIDOCAINE-EPINEPHRINE (PF) 1 %-1:200000 IJ SOLN
INTRAMUSCULAR | Status: AC
  Filled 2018-11-25: qty 30

## 2018-11-25 SURGICAL SUPPLY — 65 items
ADH SKN CLS APL DERMABOND .7 (GAUZE/BANDAGES/DRESSINGS) ×8
ARMBAND PINK RESTRICT EXTREMIT (MISCELLANEOUS) ×6 IMPLANT
BAG DECANTER FOR FLEXI CONT (MISCELLANEOUS) ×3 IMPLANT
BANDAGE ELASTIC 4 VELCRO ST LF (GAUZE/BANDAGES/DRESSINGS) ×3 IMPLANT
BIOPATCH RED 1 DISK 7.0 (GAUZE/BANDAGES/DRESSINGS) ×3 IMPLANT
CANISTER SUCT 3000ML PPV (MISCELLANEOUS) ×3 IMPLANT
CATH PALINDROME RT-P 15FX19CM (CATHETERS) ×3 IMPLANT
CATH PALINDROME RT-P 15FX23CM (CATHETERS) IMPLANT
CATH PALINDROME RT-P 15FX28CM (CATHETERS) IMPLANT
CATH PALINDROME RT-P 15FX55CM (CATHETERS) IMPLANT
CLIP VESOCCLUDE MED 6/CT (CLIP) ×3 IMPLANT
CLIP VESOCCLUDE SM WIDE 6/CT (CLIP) ×3 IMPLANT
COVER DOME SNAP 22 D (MISCELLANEOUS) ×3 IMPLANT
COVER PROBE W GEL 5X96 (DRAPES) ×3 IMPLANT
COVER SURGICAL LIGHT HANDLE (MISCELLANEOUS) ×3 IMPLANT
COVER WAND RF STERILE (DRAPES) ×3 IMPLANT
DERMABOND ADVANCED (GAUZE/BANDAGES/DRESSINGS) ×4
DERMABOND ADVANCED .7 DNX12 (GAUZE/BANDAGES/DRESSINGS) ×8 IMPLANT
DRAPE C-ARM 42X72 X-RAY (DRAPES) ×3 IMPLANT
DRAPE CHEST BREAST 15X10 FENES (DRAPES) ×3 IMPLANT
DRSG COVADERM 4X6 (GAUZE/BANDAGES/DRESSINGS) ×3 IMPLANT
ELECT REM PT RETURN 9FT ADLT (ELECTROSURGICAL) ×3
ELECTRODE REM PT RTRN 9FT ADLT (ELECTROSURGICAL) ×2 IMPLANT
GAUZE 4X4 16PLY RFD (DISPOSABLE) ×3 IMPLANT
GLOVE BIO SURGEON STRL SZ7.5 (GLOVE) ×3 IMPLANT
GLOVE BIOGEL PI IND STRL 7.0 (GLOVE) ×2 IMPLANT
GLOVE BIOGEL PI INDICATOR 7.0 (GLOVE) ×1
GLOVE SS BIOGEL STRL SZ 7 (GLOVE) ×6 IMPLANT
GLOVE SUPERSENSE BIOGEL SZ 7 (GLOVE) ×3
GOWN STRL REUS W/ TWL LRG LVL3 (GOWN DISPOSABLE) ×4 IMPLANT
GOWN STRL REUS W/ TWL XL LVL3 (GOWN DISPOSABLE) ×2 IMPLANT
GOWN STRL REUS W/TWL LRG LVL3 (GOWN DISPOSABLE) ×6
GOWN STRL REUS W/TWL XL LVL3 (GOWN DISPOSABLE) ×3
GRAFT COLLAGEN VASCULAR 7X40 (Vascular Products) ×3 IMPLANT
HEMOSTAT SNOW SURGICEL 2X4 (HEMOSTASIS) IMPLANT
INSERT FOGARTY SM (MISCELLANEOUS) ×3 IMPLANT
KIT BASIN OR (CUSTOM PROCEDURE TRAY) ×3 IMPLANT
KIT TURNOVER KIT B (KITS) ×3 IMPLANT
NEEDLE 18GX1X1/2 (RX/OR ONLY) (NEEDLE) ×3 IMPLANT
NEEDLE HYPO 25GX1X1/2 BEV (NEEDLE) ×3 IMPLANT
NS IRRIG 1000ML POUR BTL (IV SOLUTION) ×3 IMPLANT
PACK CV ACCESS (CUSTOM PROCEDURE TRAY) ×3 IMPLANT
PACK SURGICAL SETUP 50X90 (CUSTOM PROCEDURE TRAY) ×3 IMPLANT
PAD ARMBOARD 7.5X6 YLW CONV (MISCELLANEOUS) ×6 IMPLANT
SOAP 2 % CHG 4 OZ (WOUND CARE) ×3 IMPLANT
SPONGE LAP 18X18 RF (DISPOSABLE) ×3 IMPLANT
SUT ETHILON 3 0 PS 1 (SUTURE) ×6 IMPLANT
SUT MNCRL AB 4-0 PS2 18 (SUTURE) ×9 IMPLANT
SUT PROLENE 5 0 C 1 24 (SUTURE) ×6 IMPLANT
SUT PROLENE 6 0 BV (SUTURE) IMPLANT
SUT SILK 2 0 PERMA HAND 18 BK (SUTURE) ×3 IMPLANT
SUT SILK 2 0 SH (SUTURE) IMPLANT
SUT VIC AB 3-0 SH 27 (SUTURE) ×9
SUT VIC AB 3-0 SH 27X BRD (SUTURE) ×6 IMPLANT
SYR 10ML LL (SYRINGE) ×3 IMPLANT
SYR 20CC LL (SYRINGE) ×6 IMPLANT
SYR 50ML LL SCALE MARK (SYRINGE) ×3 IMPLANT
SYR 5ML LL (SYRINGE) ×3 IMPLANT
SYR CONTROL 10ML LL (SYRINGE) ×3 IMPLANT
SYR TOOMEY 50ML (SYRINGE) IMPLANT
TOWEL GREEN STERILE (TOWEL DISPOSABLE) ×3 IMPLANT
TOWEL GREEN STERILE FF (TOWEL DISPOSABLE) ×3 IMPLANT
TOWEL OR 17X26 4PK STRL BLUE (TOWEL DISPOSABLE) ×3 IMPLANT
UNDERPAD 30X30 (UNDERPADS AND DIAPERS) ×3 IMPLANT
WATER STERILE IRR 1000ML POUR (IV SOLUTION) ×3 IMPLANT

## 2018-11-25 NOTE — Anesthesia Postprocedure Evaluation (Signed)
Anesthesia Post Note  Patient: Zachary [name]  Procedure(s) Performed: INSERTION OF ARTERIOVENOUS (AV) ARTEGRAFT RIGHT UPPER ARM (Right Arm Upper) INSERTION OF DIALYSIS CATHETER RIGHT INTERNAL JUGULAR (Right )     Patient location during evaluation: PACU Anesthesia Type: General Level of consciousness: awake Pain management: pain level controlled Vital Signs Assessment: post-procedure vital signs reviewed and stable Respiratory status: spontaneous breathing, nonlabored ventilation, respiratory function stable and patient connected to nasal cannula oxygen Cardiovascular status: blood pressure returned to baseline and stable Postop Assessment: no apparent nausea or vomiting Anesthetic complications: no    Last Vitals:  Vitals:   11/25/18 1236 11/25/18 1306  BP: 121/70 118/78  Pulse: 86 85  Resp: 17 16  Temp: (!) 36.1 C (!) 36.1 C  SpO2: 98% 98%    Last Pain:  Vitals:   11/25/18 1236  TempSrc:   PainSc: 0-No pain                 Ryan P Ellender

## 2018-11-25 NOTE — Progress Notes (Signed)
Telephone report called to [place], RN at [facility]. Discharge instructions reviewed with Delph[place]. Made aware of discharge from hospital and return to facility. Will await transport.

## 2018-11-25 NOTE — Transfer of Care (Signed)
Immediate Anesthesia Transfer of Care Note  Patient: Zachary [name]  Procedure(s) Performed: INSERTION OF ARTERIOVENOUS (AV) ARTEGRAFT RIGHT UPPER ARM (Right Arm Upper) INSERTION OF DIALYSIS CATHETER RIGHT INTERNAL JUGULAR (Right )  Patient Location: PACU  Anesthesia Type:General  Level of Consciousness: awake, alert  and oriented  Airway & Oxygen Therapy: Patient Spontanous Breathing and Patient connected to nasal cannula oxygen  Post-op Assessment: Report given to RN, Post -op Vital signs reviewed and stable and Patient moving all extremities X 4  Post vital signs: Reviewed and stable  Last Vitals:  Vitals Value Taken Time  BP 124/80 11/25/2018 12:15 PM  Temp 36.1 C 11/25/2018 12:14 PM  Pulse 81 11/25/2018 12:18 PM  Resp 16 11/25/2018 12:18 PM  SpO2 99 % 11/25/2018 12:18 PM  Vitals shown include unvalidated device data.  Last Pain:  Vitals:   11/25/18 1214  TempSrc:   PainSc: 0-No pain      Patients Stated Pain Goal: 0 (84/12/82 0813)  Complications: No apparent anesthesia complications

## 2018-11-25 NOTE — H&P (Signed)
   History and Physical Update  The patient was interviewed and re-examined.  [name] previous History and Physical has been reviewed and is unchanged from recent office visit. Plan for right arm avf interposition and tdc in OR today.   Zachary [name] C. Donzetta Matters, MD [facility] [place] Office: [phone] Pager: [phone]  11/25/2018, 9:25 AM

## 2018-11-25 NOTE — Discharge Instructions (Signed)
° ° °  OK TO TAKE ACE WRAP OFF IN 24-48 HOURS     [facility]  Discharge Instructions  AV Fistula or Graft Surgery for Dialysis Access  Please refer to the following instructions for your post-procedure care. Your surgeon or physician assistant will discuss any changes with you.  Activity  You may drive the day following your surgery, if you are comfortable and no longer taking prescription pain medication. Resume full activity as the soreness in your incision resolves.  Bathing/Showering  You may shower after you go home. Keep your incision dry for 48 hours. Do not soak in a bathtub, hot tub, or swim until the incision heals completely. You may not shower if you have a hemodialysis catheter.  Incision Care  Clean your incision with mild soap and water after 48 hours. Pat the area dry with a clean towel. You do not need a bandage unless otherwise instructed. Do not apply any ointments or creams to your incision. You may have skin glue on your incision. Do not peel it off. It will come off on its own in about one week. Your arm may swell a bit after surgery. To reduce swelling use pillows to elevate your arm so it is above your heart. Your doctor will tell you if you need to lightly wrap your arm with an ACE bandage.  Diet  Resume your normal diet. There are not special food restrictions following this procedure. In order to heal from your surgery, it is CRITICAL to get adequate nutrition. Your body requires vitamins, minerals, and protein. Vegetables are the best source of vitamins and minerals. Vegetables also provide the perfect balance of protein. Processed food has little nutritional value, so try to avoid this.  Medications  Resume taking all of your medications. If your incision is causing pain, you may take over-the counter pain relievers such as acetaminophen (Tylenol). If you were prescribed a stronger pain medication, please be aware these  medications can cause nausea and constipation. Prevent nausea by taking the medication with a snack or meal. Avoid constipation by drinking plenty of fluids and eating foods with high amount of fiber, such as fruits, vegetables, and grains. Do not take Tylenol if you are taking prescription pain medications.     Follow up Your surgeon may want to see you in the office following your access surgery. If so, this will be arranged at the time of your surgery.  Please call us immediately for any of the following conditions:  Increased pain, redness, drainage (pus) from your incision site Fever of 101 degrees or higher Severe or worsening pain at your incision site Hand pain or numbness.  Reduce your risk of vascular disease:  Stop smoking. If you would like help, call [facility] at 1-800-QUIT-NOW 939-471-6763) or [place] at [place] your cholesterol Maintain a desired weight Control your diabetes Keep your blood pressure down  Dialysis  It will take several weeks to several months for your new dialysis access to be ready for use. Your surgeon will determine when it is OK to use it. Your nephrologist will continue to direct your dialysis. You can continue to use your Permcath until your new access is ready for use.  If you have any questions, please call the office at [phone].

## 2018-11-25 NOTE — Anesthesia Preprocedure Evaluation (Addendum)
Anesthesia Evaluation  Patient identified by MRN, date of birth, ID band Patient awake    Reviewed: Allergy & Precautions, NPO status , Patient's Chart, lab work & pertinent test results  Airway Mallampati: II  TM Distance: >3 FB Neck ROM: Full    Dental  (+) Missing   Pulmonary neg pulmonary ROS,    Pulmonary exam normal breath sounds clear to auscultation       Cardiovascular hypertension, + DVT  Normal cardiovascular exam Rhythm:Regular Rate:Normal  ECG: ST, rate 116   Neuro/Psych PSYCHIATRIC DISORDERS Anxiety MR (mental retardation)negative neurological ROS     GI/Hepatic Neg liver ROS, GERD  Medicated and Controlled,  Endo/Other  diabetes, Insulin DependentHypothyroidism   Renal/GU ESRF and DialysisRenal disease     Musculoskeletal negative musculoskeletal ROS (+)   Abdominal (+) + obese,   Peds  Hematology  (+) anemia , HLD   Anesthesia Other Findings End stage renal disease  Reproductive/Obstetrics                            Anesthesia Physical Anesthesia Plan  ASA: IV  Anesthesia Plan: General   Post-op Pain Management:    Induction:   PONV Risk Score and Plan: 2 and Treatment may vary due to age or medical condition and Ondansetron  Airway Management Planned: LMA and Oral ETT  Additional Equipment:   Intra-op Plan:   Post-operative Plan: Extubation in OR  Informed Consent: I have reviewed the patients History and Physical, chart, labs and discussed the procedure including the risks, benefits and alternatives for the proposed anesthesia with the patient or authorized representative who has indicated his/her understanding and acceptance.     Dental advisory given  Plan Discussed with: CRNA  Anesthesia Plan Comments:      Anesthesia Quick Evaluation

## 2018-11-25 NOTE — Anesthesia Procedure Notes (Signed)
Procedure Name: LMA Insertion Date/Time: 11/25/2018 10:12 AM Performed by: Lieutenant Diego, CRNA Pre-anesthesia Checklist: Patient identified, Emergency Drugs available, Suction available and Patient being monitored Patient Re-evaluated:Patient Re-evaluated prior to induction Oxygen Delivery Method: Circle system utilized Preoxygenation: Pre-oxygenation with 100% oxygen Induction Type: IV induction Ventilation: Mask ventilation without difficulty LMA: LMA inserted LMA Size: 4.0 Number of attempts: 1 Placement Confirmation: positive ETCO2 and breath sounds checked- equal and bilateral Tube secured with: Tape Dental Injury: Teeth and Oropharynx as per pre-operative assessment

## 2018-11-25 NOTE — Op Note (Signed)
Patient name: Zachary [name] MRN: [id number] DOB: 01-03-1946 Sex: male  11/25/2018 Pre-operative Diagnosis: End-stage renal disease, malfunction right arm AV fistula Post-operative diagnosis:  Same Surgeon:  Eda Paschal. Donzetta Matters, MD Assistant: Arlee Muslim, PA Procedure Performed: 1.  Right IJ 19 cm tunneled dialysis catheter placement with ultrasound guidance 2.  Revision of right arm AV fistula with interposition Artegraft  Indications: 73 year old male on dialysis via right upper arm AV fistula.  He has had issues with cannulation and clearance.  Recent fistulogram demonstrated likely inflow disease as well as outflow disease.  We have elected to replace the fistula with interposition graft.  He will also need tunnel dialysis catheter to rest the fistula in the interim.  Findings: IJ was patent without any previous evidence of chronic disease.  19 cm tunnel dialysis catheter placed terminating at the right atrium.  The fistula itself was quite diseased at the anastomosis.  This was all taken down.  Interposition Artegraft was placed at completion there is a very strong thrill and palpable radial pulse the wrist confirmed with Doppler.   Procedure:  The patient was identified in the holding area and taken to the operating room where general anesthesia was induced.  He was sterilely prepped and draped in the right upper extremity right chest and neck in the usual fashion antibiotics were administered and a timeout was called.  I used ultrasound to evaluate the right IJ which was noted to be patent and compressible.  This was cannulated with 18-gauge needle followed by a wire.  A counterincision was made and a 19 cm catheter was tunneled.  The wire tract was serially dilated and introducer sheath was placed under fluoroscopic guidance.  The catheter was assembled and placed into the introducer sheath to the level of the atrial innominate junction.  Was flushed with heparinized saline affixed the skin  with 3-0 nylon suture.  Neck incision was closed with 4 Monocryl and Dermabond placed to both sides.  Sterile dressing was placed.  Catheter was locked with concentrated heparin 1.5 cc in either port.  We then turned our attention to the right upper extremity.  Ultrasound was used to evaluate where the fistula was healthy near the axilla and incision was made there.  At the anastomosis it appeared heavily diseased and incision was made through the previous incision above the antecubitum.  I dissected down to the axilla identified the fistula there was pulsatility and I placed a vessel loop around a marker for orientation.  We then dissected down to the brachial artery placed Vesseloops around both of these.  I then tunneled an interposition Artegraft marker for orientation.  I first clamped the fistula in the axilla distally and proximally and opened it longitudinally and spatulated.  I tied it off proximally.  I spatulated our Artegraft and sewed end-to-end with 5-0 Prolene suture.  Upon completion I then flushed through the graft itself.  I then turned my attention above the antecubitum.  Brachial artery was clamped distally proximally and I transected the previous fistula at the anastomosis.  I did have to clean up back to healthy-appearing fistula where there was just a small cuff.  I flushed in both directions with heparinized saline.  I then turned my graft to size and sewed end-to-end to the existing cuff with 6-0 Prolene suture.  Prior to completion anastomosis I allowed flushing all directions.  Upon completion there was a very strong thrill in the Artegraft.  There was a palpable radial pulse  the wrist and both these were confirmed with Doppler.  I then irrigated both wounds.  I obtained hemostasis closed in layers with Vicryl Monocryl.  He was then awakened anesthesia having tolerated procedure without immediate complication.  All counts were correct at completion.  EBL: 100 cc    Ashden Sonnenberg C. Donzetta Matters,  MD [facility] [place] Office: [phone] Pager: [phone]

## 2018-11-26 ENCOUNTER — Encounter (HOSPITAL_COMMUNITY): Payer: Self-pay | Admitting: Vascular Surgery

## 2018-11-26 DIAGNOSIS — N2581 Secondary hyperparathyroidism of renal origin: Secondary | ICD-10-CM | POA: Diagnosis not present

## 2018-11-26 DIAGNOSIS — Z992 Dependence on renal dialysis: Secondary | ICD-10-CM | POA: Diagnosis not present

## 2018-11-26 DIAGNOSIS — N186 End stage renal disease: Secondary | ICD-10-CM | POA: Diagnosis not present

## 2018-11-26 DIAGNOSIS — D509 Iron deficiency anemia, unspecified: Secondary | ICD-10-CM | POA: Diagnosis not present

## 2018-11-27 DIAGNOSIS — N2581 Secondary hyperparathyroidism of renal origin: Secondary | ICD-10-CM | POA: Diagnosis not present

## 2018-11-27 DIAGNOSIS — D509 Iron deficiency anemia, unspecified: Secondary | ICD-10-CM | POA: Diagnosis not present

## 2018-11-27 DIAGNOSIS — N186 End stage renal disease: Secondary | ICD-10-CM | POA: Diagnosis not present

## 2018-11-27 DIAGNOSIS — D631 Anemia in chronic kidney disease: Secondary | ICD-10-CM | POA: Diagnosis not present

## 2018-11-27 DIAGNOSIS — Z992 Dependence on renal dialysis: Secondary | ICD-10-CM | POA: Diagnosis not present

## 2018-11-30 DIAGNOSIS — N186 End stage renal disease: Secondary | ICD-10-CM | POA: Diagnosis not present

## 2018-11-30 DIAGNOSIS — D631 Anemia in chronic kidney disease: Secondary | ICD-10-CM | POA: Diagnosis not present

## 2018-11-30 DIAGNOSIS — Z992 Dependence on renal dialysis: Secondary | ICD-10-CM | POA: Diagnosis not present

## 2018-11-30 DIAGNOSIS — D509 Iron deficiency anemia, unspecified: Secondary | ICD-10-CM | POA: Diagnosis not present

## 2018-11-30 DIAGNOSIS — N2581 Secondary hyperparathyroidism of renal origin: Secondary | ICD-10-CM | POA: Diagnosis not present

## 2018-12-02 DIAGNOSIS — N2581 Secondary hyperparathyroidism of renal origin: Secondary | ICD-10-CM | POA: Diagnosis not present

## 2018-12-02 DIAGNOSIS — N186 End stage renal disease: Secondary | ICD-10-CM | POA: Diagnosis not present

## 2018-12-02 DIAGNOSIS — D631 Anemia in chronic kidney disease: Secondary | ICD-10-CM | POA: Diagnosis not present

## 2018-12-02 DIAGNOSIS — Z992 Dependence on renal dialysis: Secondary | ICD-10-CM | POA: Diagnosis not present

## 2018-12-02 DIAGNOSIS — D509 Iron deficiency anemia, unspecified: Secondary | ICD-10-CM | POA: Diagnosis not present

## 2018-12-04 DIAGNOSIS — Z992 Dependence on renal dialysis: Secondary | ICD-10-CM | POA: Diagnosis not present

## 2018-12-04 DIAGNOSIS — D509 Iron deficiency anemia, unspecified: Secondary | ICD-10-CM | POA: Diagnosis not present

## 2018-12-04 DIAGNOSIS — N186 End stage renal disease: Secondary | ICD-10-CM | POA: Diagnosis not present

## 2018-12-04 DIAGNOSIS — N2581 Secondary hyperparathyroidism of renal origin: Secondary | ICD-10-CM | POA: Diagnosis not present

## 2018-12-04 DIAGNOSIS — D631 Anemia in chronic kidney disease: Secondary | ICD-10-CM | POA: Diagnosis not present

## 2018-12-06 ENCOUNTER — Encounter (HOSPITAL_COMMUNITY)
Admission: RE | Admit: 2018-12-06 | Discharge: 2018-12-06 | Disposition: A | Discharge status: Home / Self Care | Payer: Medicare Other | Source: Skilled Nursing Facility | Attending: Internal Medicine | Admitting: Internal Medicine

## 2018-12-06 DIAGNOSIS — D500 Iron deficiency anemia secondary to blood loss (chronic): Secondary | ICD-10-CM | POA: Diagnosis not present

## 2018-12-06 DIAGNOSIS — I120 Hypertensive chronic kidney disease with stage 5 chronic kidney disease or end stage renal disease: Secondary | ICD-10-CM | POA: Diagnosis not present

## 2018-12-06 DIAGNOSIS — E1129 Type 2 diabetes mellitus with other diabetic kidney complication: Secondary | ICD-10-CM | POA: Insufficient documentation

## 2018-12-06 LAB — TSH: TSH: 2.752 u[IU]/mL (ref 0.350–4.500)

## 2018-12-07 DIAGNOSIS — D509 Iron deficiency anemia, unspecified: Secondary | ICD-10-CM | POA: Diagnosis not present

## 2018-12-07 DIAGNOSIS — N186 End stage renal disease: Secondary | ICD-10-CM | POA: Diagnosis not present

## 2018-12-07 DIAGNOSIS — D631 Anemia in chronic kidney disease: Secondary | ICD-10-CM | POA: Diagnosis not present

## 2018-12-07 DIAGNOSIS — N2581 Secondary hyperparathyroidism of renal origin: Secondary | ICD-10-CM | POA: Diagnosis not present

## 2018-12-07 DIAGNOSIS — Z992 Dependence on renal dialysis: Secondary | ICD-10-CM | POA: Diagnosis not present

## 2018-12-09 ENCOUNTER — Non-Acute Institutional Stay (SKILLED_NURSING_FACILITY): Payer: Medicare Other | Admitting: Internal Medicine

## 2018-12-09 ENCOUNTER — Encounter: Payer: Self-pay | Admitting: Internal Medicine

## 2018-12-09 DIAGNOSIS — N186 End stage renal disease: Secondary | ICD-10-CM | POA: Diagnosis not present

## 2018-12-09 DIAGNOSIS — D509 Iron deficiency anemia, unspecified: Secondary | ICD-10-CM | POA: Diagnosis not present

## 2018-12-09 DIAGNOSIS — E039 Hypothyroidism, unspecified: Secondary | ICD-10-CM | POA: Diagnosis not present

## 2018-12-09 DIAGNOSIS — E782 Mixed hyperlipidemia: Secondary | ICD-10-CM | POA: Diagnosis not present

## 2018-12-09 DIAGNOSIS — R338 Other retention of urine: Secondary | ICD-10-CM

## 2018-12-09 DIAGNOSIS — N189 Chronic kidney disease, unspecified: Secondary | ICD-10-CM

## 2018-12-09 DIAGNOSIS — N401 Enlarged prostate with lower urinary tract symptoms: Secondary | ICD-10-CM

## 2018-12-09 DIAGNOSIS — Z992 Dependence on renal dialysis: Secondary | ICD-10-CM | POA: Diagnosis not present

## 2018-12-09 DIAGNOSIS — E1122 Type 2 diabetes mellitus with diabetic chronic kidney disease: Secondary | ICD-10-CM | POA: Diagnosis not present

## 2018-12-09 DIAGNOSIS — D631 Anemia in chronic kidney disease: Secondary | ICD-10-CM

## 2018-12-09 DIAGNOSIS — Z794 Long term (current) use of insulin: Secondary | ICD-10-CM | POA: Diagnosis not present

## 2018-12-09 DIAGNOSIS — N2581 Secondary hyperparathyroidism of renal origin: Secondary | ICD-10-CM | POA: Diagnosis not present

## 2018-12-09 NOTE — Progress Notes (Signed)
This is a routine visit.  Level of care skilled.  Facility is [facility].  Chief complaint team visit for medical management of chronic medical conditions including end-stage renal disease on dialysis 3 days a week- type 2   diabetes-hypothyroidism-anemia- mild mental delay-history of right distal fracture status post ORIF- as well as a history of a DVT- anemia- GI bleed status post colectomy this was in July 2018.  Also hyperlipidemia and hypertriglyceridemia.  History of present illness   Patient is a pleasant 73 year old male with a long-term resident of facility with the above diagnoses- he has been quite stable now for an extended period of time.  Recently had a short hospitalization for a fistula placement and tolerated it well  He was recently seen by Dr. Lyndel Safe for elevated blood sugars his Lantus was increased to 20 units and his bolus NovoLog was increased to 10 units and 14 units of blood sugar was more than 300.  A1c recently done at dialysis was 8.2 which is relatively stable with recent A1c's.  He is also been started on Januvia- blood sugars appear to be somewhat improved there are infrequent readings in the 300s it was 160 this morning 174 at noon and 210 at 4 PM- he still has frequent readings however in the 200s- This is been complicated with patient being noncompliant- he still eats a lot of takeout food although we have spoken to him about limiting this.  Nursing does not report any other issues.  He does have a history of mild mental deficits but is doing very well with supportive care.  When he first came here he had a right distal fracture that was repaired this was complicated with a DVT-he had been on anticoagulation but developed a GI bleed and required a colectomy at one point- he is no longer on anticoagulation hemoglobin has shown relative stability hemoglobin was 11 on the lab done in late January--recent lab at dialysis was down somewhat at 9.5 but  there is some variation -- dialysis is aware of this with his history of chronic kidney disease contributing to anemia   At one point he did have an incidental finding on abdominal CT showed a pancreatic head lesion recommendation for follow-up in 2 years-which would be around March 2021.  Currently he is resting in bed comfortably at times he does have low blood pressures and is on Midrin on dialysis days I got a blood pressure of 100/60 which is not unusual.--He denies any symptoms including dizziness or palpitations  Past Medical History:  Diagnosis Date  . Abnormal CT scan, kidney 10/06/2011  . Acute pyelonephritis 10/07/2011  . Anemia    normocytic  . Anxiety    mental retardation  . Bladder wall thickening 10/06/2011  . BPH (benign prostatic hypertrophy)   . Diabetes mellitus   . Dialysis patient [facility])    Tuesday, Thursday and Saturday,   . Edema     history of lower extremity edema  . GERD (gastroesophageal reflux disease)   . Heme positive stool   . Hydronephrosis   . Hyperkalemia   . Hyperlipidemia   . Hypernatremia   . Hypertension   . Hypothyroidism   . Impaired speech   . 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 MR (mental retardation)   . Muscle weakness   . Obstructive uropathy   . 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 Poor historian poor historian  . Protein calorie malnutrition ([place])   . Pyelonephritis   .  Renal failure (ARF), acute on chronic (HCC)   . Renal insufficiency    chronic history  . Sepsis ([place])   . Smoking   . Uremia   . Urinary retention   . UTI (lower urinary tract infection) 10/06/2011        Past Surgical History:  Procedure Laterality Date  . A/V FISTULAGRAM N/A 08/13/2018   Procedure: A/V FISTULAGRAM - Right Upper;  Surgeon: Elam Dutch, MD;  Location: [facility];  Service: Cardiovascular;  Laterality: N/A;  . AV FISTULA PLACEMENT Left 07/06/2015   Procedure:  INSERTION LEFT ARM  ARTERIOVENOUS GORTEX GRAFT;  Surgeon: Angelia Mould, MD;  Location: [place];  Service: Vascular;  Laterality: Left;  . AV FISTULA PLACEMENT Right 02/26/2016   Procedure: ARTERIOVENOUS (AV) FISTULA CREATION ;  Surgeon: Angelia Mould, MD;  Location: [place];  Service: Vascular;  Laterality: Right;  . [place] REMOVAL Left 10/09/2015   Procedure: REMOVAL OF ARTERIOVENOUS GORETEX GRAFT ([place]) Evacuation of Lymphocele, Vein Patch angioplasty of brachial artery.;  Surgeon: Angelia Mould, MD;  Location: [place];  Service: Vascular;  Laterality: Left;  . BASCILIC VEIN TRANSPOSITION Right 02/26/2016   Procedure: Right BASCILIC VEIN TRANSPOSITION;  Surgeon: Angelia Mould, MD;  Location: [place];  Service: Vascular;  Laterality: Right;  . CIRCUMCISION N/A 01/04/2014   Procedure: CIRCUMCISION ADULT (procedure #1);  Surgeon: Marissa Nestle, MD;  Location: [facility];  Service: Urology;  Laterality: N/A;  . COLECTOMY N/A 05/04/2017   Procedure: TOTAL COLECTOMY;  Surgeon: Aviva Signs, MD;  Location: [facility];  Service: General;  Laterality: N/A;  . COLONOSCOPY N/A 04/27/2017   Procedure: COLONOSCOPY;  Surgeon: Daneil Dolin, MD;  [facility];  Service: Endoscopy;  Laterality: N/A;  245  . CYSTOSCOPY W/ RETROGRADES Bilateral 06/29/2015   Procedure: CYSTOSCOPY, DILATION OF URETHRAL STRICTURE WITH BILATERAL RETROGRADE PYELOGRAM,SUPRAPUBIC TUBE CHANGE;  Surgeon: Festus Aloe, MD;  Location: [facility];  Service: Urology;  Laterality: Bilateral;  . CYSTOSCOPY WITH URETHRAL DILATATION N/A 12/29/2013   Procedure: CYSTOSCOPY WITH URETHRAL DILATATION;  Surgeon: Marissa Nestle, MD;  Location: [facility];  Service: Urology;  Laterality: N/A;  . ESOPHAGOGASTRODUODENOSCOPY N/A 04/27/2017   Procedure: ESOPHAGOGASTRODUODENOSCOPY (EGD);  Surgeon: Daneil Dolin, MD;  [facility];  Service: Endoscopy;  Laterality: N/A;  . IR AV DIALY SHUNT INTRO NEEDLE/INTRACATH INITIAL W/PTA/IMG RIGHT  Right 09/07/2018  . IR THROMBECTOMY AV FISTULA W/THROMBOLYSIS/PTA INC/SHUNT/IMG RIGHT Right 04/26/2018  . IR US GUIDE VASC ACCESS RIGHT  04/26/2018  . IR US GUIDE VASC ACCESS RIGHT  09/07/2018  . ORIF FEMUR FRACTURE Right 11/22/2016   Procedure: OPEN REDUCTION INTERNAL FIXATION (ORIF) DISTAL FEMUR FRACTURE;  Surgeon: Rod Can, MD;  Location: [place];  Service: Orthopedics;  Laterality: Right;  . PATCH ANGIOPLASTY Right 12/10/2017   Procedure: PATCH ANGIOPLASTY;  Surgeon: Angelia Mould, MD;  [facility];  Service: Vascular;  Laterality: Right;  . PERIPHERAL VASCULAR BALLOON ANGIOPLASTY  08/13/2018   Procedure: PERIPHERAL VASCULAR BALLOON ANGIOPLASTY;  Surgeon: Elam Dutch, MD;  Location: [place] [facility];  Service: Cardiovascular;;  right AV fistula   . PERIPHERAL VASCULAR CATHETERIZATION N/A 10/08/2015   Procedure: A/V Shuntogram;  Surgeon: Angelia Mould, MD;  Location: [place] [facility];  Service: Cardiovascular;  Laterality: N/A;  . THROMBECTOMY W/ EMBOLECTOMY Right 12/10/2017   Procedure: THROMBECTOMY REVISION RIGHT ARM  ARTERIOVENOUS FISTULA;  Surgeon: Angelia Mould, MD;  Location: [place];  Service: Vascular;  Laterality: Right;  . TRANSURETHRAL RESECTION OF PROSTATE N/A 01/04/2014   Procedure: TRANSURETHRAL RESECTION OF THE PROSTATE (TURP) (procedure #2);  Surgeon: Marissa Nestle, MD;  Location: [facility];  Service: Urology;  Laterality: N/A;    No Known Allergies    MEDICATIONS      Sig  . atorvastatin (LIPITOR) 80 MG tablet Take 80 mg by mouth every evening.  . insulin aspart (NOVOLOG FLEXPEN) 100 UNIT/ML FlexPen Inject 10to 14 Units into the skin 2 (two) times daily.  . Insulin Glargine (BASAGLAR KWIKPEN) 100 UNIT/ML SOPN Inject 20 Units into the skin at bedtime.   Marland Kitchen levothyroxine (SYNTHROID, LEVOTHROID) 88 MCG tablet Take 88 mcg by mouth daily before breakfast.  . midodrine (PROAMATINE) 5 MG tablet Take 5 mg by mouth 3 (three) times  a week. Take 5 mg on Tues, Thurs, and Sat at dialysis  . Multiple Vitamin (MULTIVITAMIN WITH MINERALS) TABS tablet Take 1 tablet by mouth every evening.   . Nutritional Supplements (NEPRO/CARBSTEADY PO) Take 1 Can by mouth 2 (two) times daily.  Marland Kitchen omeprazole (PRILOSEC) 40 MG capsule Take 40 mg by mouth at bedtime.   . polyethylene glycol (MIRALAX / GLYCOLAX) packet Take 17 g by mouth as needed for mild constipation.   . sevelamer carbonate (RENVELA) 800 MG tablet Take 800 mg by mouth daily.   . sevelamer carbonate (RENVELA) 800 MG tablet Take 1,600 mg by mouth 3 (three) times daily with meals.  . tamsulosin (FLOMAX) 0.4 MG CAPS capsule Take 0.4 mg by mouth every evening. Give 30 minutes after a meal  . torsemide (DEMADEX) 10 MG tablet Take 5 tablets (50 mg total) by mouth daily.         Review of systems.  General is not complaining of any fever or chills.  Skin does not complain of rashes or itching.  Head ears eyes nose mouth and throat is not complaining of visual changes or sore throat.  Respiratory denies shortness of breath or cough.  Cardiac denies chest pain has very mild lower extremity edema.  GI does not complain of abdominal pain continues to have a very good appetite does not complain of vomiting or diarrhea constipation.  GU is on dialysis with end-stage renal disease does not complain of dysuria.  Musculoskeletal largely ambulates in a wheelchair is not complaining of joint pain does have baseline lower extremity weakness.  Neurologic does not complain of dizziness headache numbness.  Psych does have a history of mild mental delay does not complain of being anxious or depressed and nursing has not noted this either   Physical exam.  He is afebrile pulse is 98 respirations of 18 blood pressure taken manually 100/60 weight is 215.6 pounds  In general this is a pleasant elderly male in no distress he appears somewhat younger than his stated age.  His skin is  warm and dry.  Eyes visual acuity appears to be intact sclera and conjunctive are clear.  Oropharynx is clear mucous membranes moist.  Chest is clear to auscultation there is no labored breathing.  Heart is regular rate and rhythm without murmur gallop or rub He has mild lower extremity edema.    Abdomen is soft nontender with positive bowel sounds.  Musculoskeletal moves all extremities x4 at baseline mostly ambulates in a wheelchair but can walk with a walker and can transfer himself.  Neurologic is grossly intact his speech is clear he is alert and pleasant.  Psych he is oriented to self is pleasant appropriate  but does have mild mental delay.  Labs.  Per recent dialysis labs drawn this month hemoglobin A1c was 8.2.  Hemoglobin was 9.5-  Potassium was 5.8 and dialysis is aware.   December 06, 2018.  TSH was 2.752  November 22, 2018.  Sodium was 138 potassium 3.9 BUN 40 creatinine 9.66 Hemoglobin was 11.0 platelets 102,000 white count was 5.6.  Assessment and plan.  1.  End-stage renal disease-he continues on dialysis he has Midodrine for hypotension he also is on Demadex  Currently weight appears to have stabilized at around 215.  2.  History of type 2 diabetes blood pressures are still running somewhat high in the 200s A1c is stable with previous levels approximately at 8.2.  His long-acting insulin has been increased to 20 units and NovoLog bolus is increased as well- will speak with his nurse about his dietary issues- and consider possibly increasing his long-acting insulin but would like to get more data on his readings to establish a true baseline.  3.  History of hypothyroidism TSH was within normal limits last month he is on Synthroid.  4.  History of BPH this appears stable on Flomax.  5.  History of hypotension again he is on Midodrine on dialysis days- he does not appear to be symptomatic.  6.-  History of mixed hyperlipidemia he has high  triglycerides and is on a significantly high dose of Lipitor 80 mg- we have talked to him about a more restrictive diet but this continues to be a challenge Updated lipid panel however in late December did show significant improvement in triglycerides now down to 197.  7.-  History of colectomy he has made a nice recovery from this has a good appetite weight appears to be stable gained about 10 pounds late last year and this appears to have stabilized the last month or 2  8.  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  [id number]     \

## 2018-12-09 NOTE — Progress Notes (Signed)
This encounter was created in error - please disregard.

## 2018-12-11 DIAGNOSIS — N186 End stage renal disease: Secondary | ICD-10-CM | POA: Diagnosis not present

## 2018-12-11 DIAGNOSIS — N2581 Secondary hyperparathyroidism of renal origin: Secondary | ICD-10-CM | POA: Diagnosis not present

## 2018-12-11 DIAGNOSIS — D509 Iron deficiency anemia, unspecified: Secondary | ICD-10-CM | POA: Diagnosis not present

## 2018-12-11 DIAGNOSIS — Z992 Dependence on renal dialysis: Secondary | ICD-10-CM | POA: Diagnosis not present

## 2018-12-11 DIAGNOSIS — D631 Anemia in chronic kidney disease: Secondary | ICD-10-CM | POA: Diagnosis not present

## 2018-12-14 DIAGNOSIS — D509 Iron deficiency anemia, unspecified: Secondary | ICD-10-CM | POA: Diagnosis not present

## 2018-12-14 DIAGNOSIS — N186 End stage renal disease: Secondary | ICD-10-CM | POA: Diagnosis not present

## 2018-12-14 DIAGNOSIS — D631 Anemia in chronic kidney disease: Secondary | ICD-10-CM | POA: Diagnosis not present

## 2018-12-14 DIAGNOSIS — Z992 Dependence on renal dialysis: Secondary | ICD-10-CM | POA: Diagnosis not present

## 2018-12-14 DIAGNOSIS — N2581 Secondary hyperparathyroidism of renal origin: Secondary | ICD-10-CM | POA: Diagnosis not present

## 2018-12-16 DIAGNOSIS — D509 Iron deficiency anemia, unspecified: Secondary | ICD-10-CM | POA: Diagnosis not present

## 2018-12-16 DIAGNOSIS — Z992 Dependence on renal dialysis: Secondary | ICD-10-CM | POA: Diagnosis not present

## 2018-12-16 DIAGNOSIS — N2581 Secondary hyperparathyroidism of renal origin: Secondary | ICD-10-CM | POA: Diagnosis not present

## 2018-12-16 DIAGNOSIS — D631 Anemia in chronic kidney disease: Secondary | ICD-10-CM | POA: Diagnosis not present

## 2018-12-16 DIAGNOSIS — N186 End stage renal disease: Secondary | ICD-10-CM | POA: Diagnosis not present

## 2018-12-18 DIAGNOSIS — Z992 Dependence on renal dialysis: Secondary | ICD-10-CM | POA: Diagnosis not present

## 2018-12-18 DIAGNOSIS — D509 Iron deficiency anemia, unspecified: Secondary | ICD-10-CM | POA: Diagnosis not present

## 2018-12-18 DIAGNOSIS — N2581 Secondary hyperparathyroidism of renal origin: Secondary | ICD-10-CM | POA: Diagnosis not present

## 2018-12-18 DIAGNOSIS — D631 Anemia in chronic kidney disease: Secondary | ICD-10-CM | POA: Diagnosis not present

## 2018-12-18 DIAGNOSIS — N186 End stage renal disease: Secondary | ICD-10-CM | POA: Diagnosis not present

## 2018-12-21 DIAGNOSIS — N2581 Secondary hyperparathyroidism of renal origin: Secondary | ICD-10-CM | POA: Diagnosis not present

## 2018-12-21 DIAGNOSIS — Z992 Dependence on renal dialysis: Secondary | ICD-10-CM | POA: Diagnosis not present

## 2018-12-21 DIAGNOSIS — D631 Anemia in chronic kidney disease: Secondary | ICD-10-CM | POA: Diagnosis not present

## 2018-12-21 DIAGNOSIS — D509 Iron deficiency anemia, unspecified: Secondary | ICD-10-CM | POA: Diagnosis not present

## 2018-12-21 DIAGNOSIS — N186 End stage renal disease: Secondary | ICD-10-CM | POA: Diagnosis not present

## 2018-12-23 DIAGNOSIS — N2581 Secondary hyperparathyroidism of renal origin: Secondary | ICD-10-CM | POA: Diagnosis not present

## 2018-12-23 DIAGNOSIS — Z992 Dependence on renal dialysis: Secondary | ICD-10-CM | POA: Diagnosis not present

## 2018-12-23 DIAGNOSIS — D509 Iron deficiency anemia, unspecified: Secondary | ICD-10-CM | POA: Diagnosis not present

## 2018-12-23 DIAGNOSIS — D631 Anemia in chronic kidney disease: Secondary | ICD-10-CM | POA: Diagnosis not present

## 2018-12-23 DIAGNOSIS — N186 End stage renal disease: Secondary | ICD-10-CM | POA: Diagnosis not present

## 2018-12-24 DIAGNOSIS — Z992 Dependence on renal dialysis: Secondary | ICD-10-CM | POA: Diagnosis not present

## 2018-12-24 DIAGNOSIS — N186 End stage renal disease: Secondary | ICD-10-CM | POA: Diagnosis not present

## 2018-12-25 DIAGNOSIS — D509 Iron deficiency anemia, unspecified: Secondary | ICD-10-CM | POA: Diagnosis not present

## 2018-12-25 DIAGNOSIS — D631 Anemia in chronic kidney disease: Secondary | ICD-10-CM | POA: Diagnosis not present

## 2018-12-25 DIAGNOSIS — N186 End stage renal disease: Secondary | ICD-10-CM | POA: Diagnosis not present

## 2018-12-25 DIAGNOSIS — N2581 Secondary hyperparathyroidism of renal origin: Secondary | ICD-10-CM | POA: Diagnosis not present

## 2018-12-25 DIAGNOSIS — Z992 Dependence on renal dialysis: Secondary | ICD-10-CM | POA: Diagnosis not present

## 2018-12-28 DIAGNOSIS — D631 Anemia in chronic kidney disease: Secondary | ICD-10-CM | POA: Diagnosis not present

## 2018-12-28 DIAGNOSIS — Z992 Dependence on renal dialysis: Secondary | ICD-10-CM | POA: Diagnosis not present

## 2018-12-28 DIAGNOSIS — N2581 Secondary hyperparathyroidism of renal origin: Secondary | ICD-10-CM | POA: Diagnosis not present

## 2018-12-28 DIAGNOSIS — D509 Iron deficiency anemia, unspecified: Secondary | ICD-10-CM | POA: Diagnosis not present

## 2018-12-28 DIAGNOSIS — N186 End stage renal disease: Secondary | ICD-10-CM | POA: Diagnosis not present

## 2018-12-29 DIAGNOSIS — Z794 Long term (current) use of insulin: Secondary | ICD-10-CM | POA: Diagnosis not present

## 2018-12-29 DIAGNOSIS — E1151 Type 2 diabetes mellitus with diabetic peripheral angiopathy without gangrene: Secondary | ICD-10-CM | POA: Diagnosis not present

## 2018-12-29 DIAGNOSIS — B351 Tinea unguium: Secondary | ICD-10-CM | POA: Diagnosis not present

## 2018-12-29 DIAGNOSIS — L603 Nail dystrophy: Secondary | ICD-10-CM | POA: Diagnosis not present

## 2018-12-30 DIAGNOSIS — N186 End stage renal disease: Secondary | ICD-10-CM | POA: Diagnosis not present

## 2018-12-30 DIAGNOSIS — N2581 Secondary hyperparathyroidism of renal origin: Secondary | ICD-10-CM | POA: Diagnosis not present

## 2018-12-30 DIAGNOSIS — D509 Iron deficiency anemia, unspecified: Secondary | ICD-10-CM | POA: Diagnosis not present

## 2018-12-30 DIAGNOSIS — D631 Anemia in chronic kidney disease: Secondary | ICD-10-CM | POA: Diagnosis not present

## 2018-12-30 DIAGNOSIS — Z992 Dependence on renal dialysis: Secondary | ICD-10-CM | POA: Diagnosis not present

## 2019-01-01 DIAGNOSIS — Z992 Dependence on renal dialysis: Secondary | ICD-10-CM | POA: Diagnosis not present

## 2019-01-01 DIAGNOSIS — N186 End stage renal disease: Secondary | ICD-10-CM | POA: Diagnosis not present

## 2019-01-01 DIAGNOSIS — D631 Anemia in chronic kidney disease: Secondary | ICD-10-CM | POA: Diagnosis not present

## 2019-01-01 DIAGNOSIS — D509 Iron deficiency anemia, unspecified: Secondary | ICD-10-CM | POA: Diagnosis not present

## 2019-01-01 DIAGNOSIS — N2581 Secondary hyperparathyroidism of renal origin: Secondary | ICD-10-CM | POA: Diagnosis not present

## 2019-01-04 DIAGNOSIS — N186 End stage renal disease: Secondary | ICD-10-CM | POA: Diagnosis not present

## 2019-01-04 DIAGNOSIS — D631 Anemia in chronic kidney disease: Secondary | ICD-10-CM | POA: Diagnosis not present

## 2019-01-04 DIAGNOSIS — N2581 Secondary hyperparathyroidism of renal origin: Secondary | ICD-10-CM | POA: Diagnosis not present

## 2019-01-04 DIAGNOSIS — D509 Iron deficiency anemia, unspecified: Secondary | ICD-10-CM | POA: Diagnosis not present

## 2019-01-04 DIAGNOSIS — Z992 Dependence on renal dialysis: Secondary | ICD-10-CM | POA: Diagnosis not present

## 2019-01-06 DIAGNOSIS — N186 End stage renal disease: Secondary | ICD-10-CM | POA: Diagnosis not present

## 2019-01-06 DIAGNOSIS — D509 Iron deficiency anemia, unspecified: Secondary | ICD-10-CM | POA: Diagnosis not present

## 2019-01-06 DIAGNOSIS — N2581 Secondary hyperparathyroidism of renal origin: Secondary | ICD-10-CM | POA: Diagnosis not present

## 2019-01-06 DIAGNOSIS — Z992 Dependence on renal dialysis: Secondary | ICD-10-CM | POA: Diagnosis not present

## 2019-01-06 DIAGNOSIS — D631 Anemia in chronic kidney disease: Secondary | ICD-10-CM | POA: Diagnosis not present

## 2019-01-08 DIAGNOSIS — N186 End stage renal disease: Secondary | ICD-10-CM | POA: Diagnosis not present

## 2019-01-08 DIAGNOSIS — Z992 Dependence on renal dialysis: Secondary | ICD-10-CM | POA: Diagnosis not present

## 2019-01-08 DIAGNOSIS — N2581 Secondary hyperparathyroidism of renal origin: Secondary | ICD-10-CM | POA: Diagnosis not present

## 2019-01-08 DIAGNOSIS — D509 Iron deficiency anemia, unspecified: Secondary | ICD-10-CM | POA: Diagnosis not present

## 2019-01-08 DIAGNOSIS — D631 Anemia in chronic kidney disease: Secondary | ICD-10-CM | POA: Diagnosis not present

## 2019-01-10 ENCOUNTER — Encounter: Payer: Self-pay | Admitting: Internal Medicine

## 2019-01-10 ENCOUNTER — Non-Acute Institutional Stay (SKILLED_NURSING_FACILITY): Payer: Medicare Other | Admitting: Internal Medicine

## 2019-01-10 DIAGNOSIS — E119 Type 2 diabetes mellitus without complications: Secondary | ICD-10-CM

## 2019-01-10 DIAGNOSIS — N401 Enlarged prostate with lower urinary tract symptoms: Secondary | ICD-10-CM | POA: Diagnosis not present

## 2019-01-10 DIAGNOSIS — R338 Other retention of urine: Secondary | ICD-10-CM | POA: Diagnosis not present

## 2019-01-10 DIAGNOSIS — E782 Mixed hyperlipidemia: Secondary | ICD-10-CM

## 2019-01-10 DIAGNOSIS — N186 End stage renal disease: Secondary | ICD-10-CM

## 2019-01-10 DIAGNOSIS — Z794 Long term (current) use of insulin: Secondary | ICD-10-CM

## 2019-01-10 NOTE — Progress Notes (Signed)
Location:  [facility] [place] Room Number: 1 D Place of Service:  SNF (31) Provider: Veleta Miners, MD  [name], MD  Patient Care Team: [name], MD as PCP - General (Internal Medicine) Cassandria Anger, MD as Consulting Physician (Endocrinology) Fran Lowes, MD as Consulting Physician (Nephrology) Gala Romney Cristopher Estimable, MD as Consulting Physician (Gastroenterology) [facility] [place] ([place])  Extended Emergency Contact Information Primary Emergency Contact: Betsey Amen Address: DSS  [place] of [place] Phone: [phone] Work Phone: [phone] Relation: Legal Guardian Secondary Emergency Contact: Brennan,Richard Address: [address]          [place], [place] [address] [place] Phone: [phone] Relation: Brother  Code Status:  Full Code Goals of care: Advanced Directive information Advanced Directives 01/10/2019  Does Patient Have a Medical Advance Directive? Yes  Type of Advance Directive -  Does patient want to make changes to medical advance directive? No - Patient declined  Copy of [place] in Chart? -  Would patient like information on creating a medical advance directive? -  Pre-existing out of facility DNR order (yellow form or pink MOST form) -     Chief Complaint  Patient presents with  . Medical Management of Chronic Issues    Hypertension, Diabetes Mellitus Type 2  . Best Practice Recommendations    Due for Urine Microalbumin    HPI:  Pt is a 73 y.o. male seen today for medical management of chronic diseases.   Patient is Long term Resident of facility.  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 He continues to be  non compliant with his Diet.Marland Kitchen His BS in the morning are still more then 200. But afternoon and Evening sugars are mostly 200. His Weight is stable. No Acute issues per Nurses. Patient has no Complains.  Past Medical History:  Diagnosis Date  . Abnormal CT scan, kidney 10/06/2011  . Acute pyelonephritis 10/07/2011  . Anemia    normocytic  . Anxiety    mental retardation  . Bladder wall thickening 10/06/2011  . BPH (benign prostatic hypertrophy)   . Diabetes mellitus   . Dialysis patient [facility])    Tuesday, Thursday and Saturday,   . Edema     history of lower extremity edema  . GERD (gastroesophageal reflux disease)   . Heme positive stool   . Hydronephrosis   . Hyperkalemia   . Hyperlipidemia   . Hypernatremia   . Hypertension   . Hypothyroidism   . Impaired speech   . 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 MR (mental retardation)   . Muscle weakness   . Obstructive uropathy   . 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 Poor historian poor historian  . Protein calorie malnutrition ([place])   . Pyelonephritis   . Renal failure (ARF), acute on chronic (HCC)   . Renal insufficiency    chronic history  . Sepsis ([place])   . Smoking   . Uremia   . Urinary retention   . UTI (lower urinary tract infection) 10/06/2011   Past Surgical History:  Procedure Laterality Date  . A/V FISTULAGRAM N/A 08/13/2018   Procedure: A/V FISTULAGRAM - Right Upper;  Surgeon: Elam Dutch, MD;  Location: [place] CV LAB;  Service: Cardiovascular;  Laterality: N/A;  . A/V FISTULAGRAM N/A  11/22/2018   Procedure: A/V FISTULAGRAM - Right Upper;  Surgeon: Waynetta Sandy, MD;  Location: [place] [facility] LAB;  Service: Cardiovascular;  Laterality: N/A;  . AV FISTULA PLACEMENT Left 07/06/2015   Procedure:  INSERTION LEFT ARM ARTERIOVENOUS GORTEX GRAFT;  Surgeon: Angelia Mould, MD;  Location: [place];  Service: Vascular;  Laterality: Left;  . AV FISTULA PLACEMENT Right 02/26/2016   Procedure:  ARTERIOVENOUS (AV) FISTULA CREATION ;  Surgeon: Angelia Mould, MD;  Location: [place];  Service: Vascular;  Laterality: Right;  . AV FISTULA PLACEMENT Right 11/25/2018   Procedure: INSERTION OF ARTERIOVENOUS (AV) ARTEGRAFT RIGHT UPPER ARM;  Surgeon: Waynetta Sandy, MD;  Location: [place];  Service: Vascular;  Laterality: Right;  . [place] REMOVAL Left 10/09/2015   Procedure: REMOVAL OF ARTERIOVENOUS GORETEX GRAFT ([place]) Evacuation of Lymphocele, Vein Patch angioplasty of brachial artery.;  Surgeon: Angelia Mould, MD;  Location: [place];  Service: Vascular;  Laterality: Left;  . BASCILIC VEIN TRANSPOSITION Right 02/26/2016   Procedure: Right BASCILIC VEIN TRANSPOSITION;  Surgeon: Angelia Mould, MD;  Location: [place];  Service: Vascular;  Laterality: Right;  . CIRCUMCISION N/A 01/04/2014   Procedure: CIRCUMCISION ADULT (procedure #1);  Surgeon: Marissa Nestle, MD;  Location: [facility];  Service: Urology;  Laterality: N/A;  . COLECTOMY N/A 05/04/2017   Procedure: TOTAL COLECTOMY;  Surgeon: Aviva Signs, MD;  Location: [facility];  Service: General;  Laterality: N/A;  . COLONOSCOPY N/A 04/27/2017   Procedure: COLONOSCOPY;  Surgeon: Daneil Dolin, MD;  [facility];  Service: Endoscopy;  Laterality: N/A;  245  . CYSTOSCOPY W/ RETROGRADES Bilateral 06/29/2015   Procedure: CYSTOSCOPY, DILATION OF URETHRAL STRICTURE WITH BILATERAL RETROGRADE PYELOGRAM,SUPRAPUBIC TUBE CHANGE;  Surgeon: Festus Aloe, MD;  Location: [facility];  Service: Urology;  Laterality: Bilateral;  . CYSTOSCOPY WITH URETHRAL DILATATION N/A 12/29/2013   Procedure: CYSTOSCOPY WITH URETHRAL DILATATION;  Surgeon: Marissa Nestle, MD;  [facility];  Service: Urology;  Laterality: N/A;  . ESOPHAGOGASTRODUODENOSCOPY N/A 04/27/2017   Procedure: ESOPHAGOGASTRODUODENOSCOPY (EGD);  Surgeon: Daneil Dolin, MD;  [facility];  Service: Endoscopy;  Laterality: N/A;  . INSERTION OF DIALYSIS CATHETER Right  11/25/2018   Procedure: INSERTION OF DIALYSIS CATHETER RIGHT INTERNAL JUGULAR;  Surgeon: Waynetta Sandy, MD;  Location: [place];  Service: Vascular;  Laterality: Right;  . IR AV DIALY SHUNT INTRO [place] W/PTA/IMG RIGHT Right 09/07/2018  . IR THROMBECTOMY AV FISTULA W/THROMBOLYSIS/PTA INC/SHUNT/IMG RIGHT Right 04/26/2018  . IR US GUIDE VASC ACCESS RIGHT  04/26/2018  . IR US GUIDE VASC ACCESS RIGHT  09/07/2018  . ORIF FEMUR FRACTURE Right 11/22/2016   Procedure: OPEN REDUCTION INTERNAL FIXATION (ORIF) DISTAL FEMUR FRACTURE;  Surgeon: Rod Can, MD;  Location: [place];  Service: Orthopedics;  Laterality: Right;  . PATCH ANGIOPLASTY Right 12/10/2017   Procedure: PATCH ANGIOPLASTY;  Surgeon: Angelia Mould, MD;  [facility];  Service: Vascular;  Laterality: Right;  . PERIPHERAL VASCULAR BALLOON ANGIOPLASTY  08/13/2018   Procedure: PERIPHERAL VASCULAR BALLOON ANGIOPLASTY;  Surgeon: Elam Dutch, MD;  Location: [place] CV LAB;  Service: Cardiovascular;;  right AV fistula   . PERIPHERAL VASCULAR BALLOON ANGIOPLASTY  11/22/2018   Procedure: PERIPHERAL VASCULAR BALLOON ANGIOPLASTY;  Surgeon: Waynetta Sandy, MD;  Location: [place] [facility];  Service: Cardiovascular;;  rt AV fistula  . PERIPHERAL VASCULAR CATHETERIZATION N/A 10/08/2015   Procedure: A/V [place]untogram;  Surgeon: Angelia Mould, MD;  [facility]  INVASIVE CV LAB;  Service: Cardiovascular;  Laterality: N/A;  . THROMBECTOMY W/ EMBOLECTOMY Right 12/10/2017   Procedure: THROMBECTOMY REVISION RIGHT ARM  ARTERIOVENOUS FISTULA;  Surgeon: Angelia Mould, MD;  Location: [place];  Service: Vascular;  Laterality: Right;  . TRANSURETHRAL RESECTION OF PROSTATE N/A 01/04/2014   Procedure: TRANSURETHRAL RESECTION OF THE PROSTATE (TURP) (procedure #2);  Surgeon: Marissa Nestle, MD;  [facility];  Service: Urology;  Laterality: N/A;    No Known Allergies  Outpatient Encounter  Medications as of 01/10/2019  Medication Sig  . atorvastatin (LIPITOR) 80 MG tablet Take 80 mg by mouth every evening.  . insulin aspart (NOVOLOG FLEXPEN) 100 UNIT/ML FlexPen Inject 5-8 Units into the skin 3 (three) times daily. Per sliding scale; < 60 = call MD, 200-300 = 5 units, > 300 = 8 units, > 400 = call MD  . Insulin Glargine (BASAGLAR KWIKPEN) 100 UNIT/ML SOPN Inject 20 Units into the skin at bedtime.   Marland Kitchen JANUVIA 25 MG tablet Take 25 mg by mouth daily.   Marland Kitchen levothyroxine (SYNTHROID, LEVOTHROID) 88 MCG tablet Take 88 mcg by mouth daily before breakfast.  . midodrine (PROAMATINE) 5 MG tablet Take 5 mg by mouth Every Tuesday,Thursday,and Saturday with dialysis.   . Multiple Vitamin (MULTIVITAMIN WITH MINERALS) TABS tablet Take 1 tablet by mouth every evening.   . NON FORMULARY Fluid Restriction - 1200 cc a day 500 cc for days, Evenings 500 cc, Night 200 cc  . NON FORMULARY Diet Type:  NAS, Cons CHO Diet  . Nutritional Supplements (NEPRO/CARBSTEADY PO) Take 1 Can by mouth 2 (two) times daily.  Marland Kitchen omeprazole (PRILOSEC) 40 MG capsule Take 40 mg by mouth at bedtime.   . polyethylene glycol (MIRALAX / GLYCOLAX) packet Take 17 g by mouth as needed for mild constipation.   . sevelamer carbonate (RENVELA) 800 MG tablet Take 800-1,600 mg by mouth See admin instructions. Take 1600 mg by mouth 3 times daily with meals and take 800 mg by mouth with snacks  . tamsulosin (FLOMAX) 0.4 MG CAPS capsule Take 0.4 mg by mouth every evening. Give 30 minutes after a meal  . torsemide (DEMADEX) 10 MG tablet Take 5 tablets (50 mg total) by mouth daily.  . [DISCONTINUED] HYDROcodone-acetaminophen (NORCO) 5-325 MG tablet Take 1 tablet by mouth every 4 (four) hours as needed for moderate pain. (Patient not taking: Reported on 01/10/2019)   No facility-administered encounter medications on file as of 01/10/2019.     Review of Systems  Review of Systems  Constitutional: Negative for activity change, appetite change,  chills, diaphoresis, fatigue and fever.  HENT: Negative for mouth sores, postnasal drip, rhinorrhea, sinus pain and sore throat.   Respiratory: Negative for apnea, cough, chest tightness, shortness of breath and wheezing.   Cardiovascular: Negative for chest pain, palpitations and leg swelling.  Gastrointestinal: Negative for abdominal distention, abdominal pain, constipation, diarrhea, nausea and vomiting.  Genitourinary: Negative for dysuria and frequency.  Musculoskeletal: Negative for arthralgias, joint swelling and myalgias.  Skin: Negative for rash.  Neurological: Negative for dizziness, syncope, weakness, light-headedness and numbness.  Psychiatric/Behavioral: Negative for behavioral problems, confusion and sleep disturbance.     Immunization History  Administered Date(s) Administered  . Influenza,inj,Quad PF,6+ Mos 12/26/2013, 07/09/2015  . Influenza-Unspecified 07/31/2017, 07/29/2018  . PPD Test 03/07/2015  . Pneumococcal Conjugate-13 07/31/2017  . Pneumococcal Polysaccharide-23 03/04/2015  . Pneumococcal-Unspecified 12/03/2016  . Tdap 08/22/2017   Pertinent  Health Maintenance Due  Topic Date Due  . URINE MICROALBUMIN  08/23/1956  . HEMOGLOBIN A1C  04/24/2019  . FOOT EXAM  05/20/2019  . OPHTHALMOLOGY EXAM  09/01/2019  . COLONOSCOPY  04/28/2027  . INFLUENZA VACCINE  Completed  . PNA vac Low Risk Adult  Completed   Fall Risk  02/18/2018  Falls in the past year? No   Functional Status Survey:    Vitals:   01/10/19 0938  BP: (!) 102/55  Pulse: 94  Resp: 20  Temp: 98.5 F (36.9 C)  Weight: 212 lb 9.6 oz (96.4 kg)  Height: 5\' 8"  (1.727 m)   Body mass index is 32.33 kg/m. Physical Exam Constitutional:      Appearance: He is well-developed.  HENT:     Head: Normocephalic.  Eyes:     Pupils: Pupils are equal, round, and reactive to light.  Neck:     Musculoskeletal: Neck supple.  Cardiovascular:     Rate and Rhythm: Normal rate.     Heart sounds: Normal  heart sounds.  Pulmonary:     Effort: Pulmonary effort is normal. No respiratory distress.     Breath sounds: Normal breath sounds. No wheezing.  Abdominal:     General: Bowel sounds are normal. There is no distension.     Palpations: Abdomen is soft.     Tenderness: There is no abdominal tenderness. There is no rebound.  Skin:    General: Skin is warm and dry.  Neurological:     Mental Status: He is alert.     Comments: Is mostly Wheelchair Bound. He can walk with Gilford Rile and is independent in his transfers  Psychiatric:        Behavior: Behavior normal.        Thought Content: Thought content normal.     Labs reviewed: Recent Labs    01/22/18 0945  07/24/18 0737  09/07/18 1356 10/23/18 0504 11/22/18 1048 11/25/18 0929  NA 137   < > 136   < > 138 143 138 142  K 3.4*   < > 4.7   < > 4.6 5.3* 3.9 4.1  CL 94*   < > 99   < > 102 113* 99  --   CO2 21*   < > 26  --   --  24 26  --   GLUCOSE 143*   < > 215*   < > 210* 150* 152* 116*  BUN 61*   < > 29*   < > 39* 43* 40*  --   CREATININE 10.63*   < > 8.42*   < > 9.80* 8.29* 9.66*  --   CALCIUM 8.2*   < > 8.9  --   --  8.9 9.1  --   PHOS 7.9*  --   --   --   --   --   --   --    < > = values in this interval not displayed.   Recent Labs    01/19/18 1608 01/22/18 0945  AST 22  --   ALT 18  --   ALKPHOS 152*  --   BILITOT 0.8  --   PROT 6.8  --   ALBUMIN 3.3* 3.2*   Recent Labs    01/19/18 1608  04/22/18 0718  07/24/18 0737  10/23/18 0504 11/22/18 1047 11/25/18 0929  WBC 7.4   < > 6.6   < > 5.4  --  5.4 5.6  --   NEUTROABS 5.3  --  3.5  --   --   --  2.3  --   --   HGB 11.0*   < > 11.7*   < > 11.4*   < > 11.5* 11.0* 11.2*  HCT 34.8*   < > 36.0*   < > 34.6*   < > 37.7* 34.9* 33.0*  MCV 106.4*   < > 105.6*   < > 107.1*  --  109.0* 103.9*  --   PLT 143*   < > 142*   < > 128*  --  145* 102*  --    < > = values in this interval not displayed.   Lab Results  Component Value Date   TSH 2.752 12/06/2018   Lab  Results  Component Value Date   HGBA1C 7.7 (H) 10/23/2018   Lab Results  Component Value Date   CHOL 86 10/23/2018   HDL 27 (L) 10/23/2018   LDLCALC 20 10/23/2018   TRIG 197 (H) 10/23/2018   CHOLHDL 3.2 10/23/2018    Significant Diagnostic Results in last 30 days:  No results found.  Assessment/Plan Type 2 diabetes mellitus His BS are running more then 200 most of the day Will Increase his Lantus to 22 units. Repeat A1C Also  on Novolog Boluses. 6 Units with Meal BID if Sugars more then 150 Eye Exam and Foot exam done in 10/19 Continue on Januvia ESRD On Dialysis Midodrine for BP Also on Demadex per Nephrology  BPH Continue on Flomax  Mixed hyperlipidemia He has high triglycerides but mostly less then 500 On increase dose of Lipitor D/W him again to follow more restrictive diet Repeat Lipid Profile S/P colectomy Appetite has improved and is doing well with his weight Benign prostatic hyperplasia  On Flomax Hypothyroid Continue same dose of Synthroid TSH normal in 02/20 Repeat TSH Macrocytosis Vit B12 normal Patientabdominal CT scan showedin 03/19 14 mm low-density/cystic pancreatic head lesion. Follow-up pancreatic protocol abdominal CT is recommended in 2 years    Family/ staff Communication:   Labs/tests ordered: Hba1C. Fasting Lipid,  Total time spent in this patient care encounter was 25_ minutes; greater than 50% of the visit spent counseling patient, reviewing records , Labs and coordinating care for problems addressed at this encounter.

## 2019-01-11 DIAGNOSIS — D509 Iron deficiency anemia, unspecified: Secondary | ICD-10-CM | POA: Diagnosis not present

## 2019-01-11 DIAGNOSIS — N2581 Secondary hyperparathyroidism of renal origin: Secondary | ICD-10-CM | POA: Diagnosis not present

## 2019-01-11 DIAGNOSIS — Z992 Dependence on renal dialysis: Secondary | ICD-10-CM | POA: Diagnosis not present

## 2019-01-11 DIAGNOSIS — N186 End stage renal disease: Secondary | ICD-10-CM | POA: Diagnosis not present

## 2019-01-11 DIAGNOSIS — D631 Anemia in chronic kidney disease: Secondary | ICD-10-CM | POA: Diagnosis not present

## 2019-01-12 ENCOUNTER — Ambulatory Visit (HOSPITAL_COMMUNITY)
Admission: RE | Admit: 2019-01-12 | Discharge: 2019-01-12 | Disposition: A | Discharge status: Home / Self Care | Payer: Medicare Other | Source: Ambulatory Visit | Attending: Nephrology | Admitting: Nephrology

## 2019-01-12 ENCOUNTER — Encounter (HOSPITAL_COMMUNITY): Payer: Self-pay | Admitting: Physician Assistant

## 2019-01-12 DIAGNOSIS — N186 End stage renal disease: Secondary | ICD-10-CM | POA: Diagnosis not present

## 2019-01-12 DIAGNOSIS — Z9889 Other specified postprocedural states: Secondary | ICD-10-CM | POA: Diagnosis not present

## 2019-01-12 DIAGNOSIS — Z992 Dependence on renal dialysis: Secondary | ICD-10-CM | POA: Diagnosis not present

## 2019-01-12 DIAGNOSIS — Z452 Encounter for adjustment and management of vascular access device: Secondary | ICD-10-CM | POA: Diagnosis not present

## 2019-01-12 HISTORY — PX: IR REMOVAL TUN CV CATH W/O FL: IMG2289

## 2019-01-12 MED ORDER — CHLORHEXIDINE GLUCONATE 4 % EX LIQD
CUTANEOUS | Status: AC
  Filled 2019-01-12: qty 15

## 2019-01-12 MED ORDER — LIDOCAINE HCL 1 % IJ SOLN
INTRAMUSCULAR | Status: AC
  Filled 2019-01-12: qty 20

## 2019-01-12 MED ORDER — LIDOCAINE HCL (PF) 1 % IJ SOLN
INTRAMUSCULAR | Status: AC | PRN
  Administered 2019-01-12: 10 mL

## 2019-01-12 NOTE — Procedures (Signed)
Pre procedural Dx: ESRD Post procedural Dx: Same  Successful removal of tunneled HD catheter  EBL: None No immediate complications.  Please see imaging section of Epic for full dictation.  Joaquim Nam PA-C 01/12/2019 12:30 PM

## 2019-01-13 DIAGNOSIS — N2581 Secondary hyperparathyroidism of renal origin: Secondary | ICD-10-CM | POA: Diagnosis not present

## 2019-01-13 DIAGNOSIS — D631 Anemia in chronic kidney disease: Secondary | ICD-10-CM | POA: Diagnosis not present

## 2019-01-13 DIAGNOSIS — Z992 Dependence on renal dialysis: Secondary | ICD-10-CM | POA: Diagnosis not present

## 2019-01-13 DIAGNOSIS — D509 Iron deficiency anemia, unspecified: Secondary | ICD-10-CM | POA: Diagnosis not present

## 2019-01-13 DIAGNOSIS — N186 End stage renal disease: Secondary | ICD-10-CM | POA: Diagnosis not present

## 2019-01-14 ENCOUNTER — Telehealth: Payer: Self-pay

## 2019-01-14 DIAGNOSIS — R262 Difficulty in walking, not elsewhere classified: Secondary | ICD-10-CM | POA: Diagnosis not present

## 2019-01-14 DIAGNOSIS — R279 Unspecified lack of coordination: Secondary | ICD-10-CM | POA: Diagnosis not present

## 2019-01-14 DIAGNOSIS — E1129 Type 2 diabetes mellitus with other diabetic kidney complication: Secondary | ICD-10-CM | POA: Diagnosis not present

## 2019-01-14 NOTE — Telephone Encounter (Signed)
Spoke to pt's guardianship social worker (Melissa Price) who needed further clarification regarding Dr. Claretha Cooper post op note.

## 2019-01-15 DIAGNOSIS — N2581 Secondary hyperparathyroidism of renal origin: Secondary | ICD-10-CM | POA: Diagnosis not present

## 2019-01-15 DIAGNOSIS — N186 End stage renal disease: Secondary | ICD-10-CM | POA: Diagnosis not present

## 2019-01-15 DIAGNOSIS — D631 Anemia in chronic kidney disease: Secondary | ICD-10-CM | POA: Diagnosis not present

## 2019-01-15 DIAGNOSIS — D509 Iron deficiency anemia, unspecified: Secondary | ICD-10-CM | POA: Diagnosis not present

## 2019-01-15 DIAGNOSIS — Z992 Dependence on renal dialysis: Secondary | ICD-10-CM | POA: Diagnosis not present

## 2019-01-17 DIAGNOSIS — R262 Difficulty in walking, not elsewhere classified: Secondary | ICD-10-CM | POA: Diagnosis not present

## 2019-01-17 DIAGNOSIS — E1129 Type 2 diabetes mellitus with other diabetic kidney complication: Secondary | ICD-10-CM | POA: Diagnosis not present

## 2019-01-17 DIAGNOSIS — R279 Unspecified lack of coordination: Secondary | ICD-10-CM | POA: Diagnosis not present

## 2019-01-18 DIAGNOSIS — R262 Difficulty in walking, not elsewhere classified: Secondary | ICD-10-CM | POA: Diagnosis not present

## 2019-01-18 DIAGNOSIS — R279 Unspecified lack of coordination: Secondary | ICD-10-CM | POA: Diagnosis not present

## 2019-01-18 DIAGNOSIS — D509 Iron deficiency anemia, unspecified: Secondary | ICD-10-CM | POA: Diagnosis not present

## 2019-01-18 DIAGNOSIS — Z992 Dependence on renal dialysis: Secondary | ICD-10-CM | POA: Diagnosis not present

## 2019-01-18 DIAGNOSIS — N186 End stage renal disease: Secondary | ICD-10-CM | POA: Diagnosis not present

## 2019-01-18 DIAGNOSIS — N2581 Secondary hyperparathyroidism of renal origin: Secondary | ICD-10-CM | POA: Diagnosis not present

## 2019-01-18 DIAGNOSIS — E1129 Type 2 diabetes mellitus with other diabetic kidney complication: Secondary | ICD-10-CM | POA: Diagnosis not present

## 2019-01-18 DIAGNOSIS — D631 Anemia in chronic kidney disease: Secondary | ICD-10-CM | POA: Diagnosis not present

## 2019-01-19 DIAGNOSIS — R279 Unspecified lack of coordination: Secondary | ICD-10-CM | POA: Diagnosis not present

## 2019-01-19 DIAGNOSIS — E1129 Type 2 diabetes mellitus with other diabetic kidney complication: Secondary | ICD-10-CM | POA: Diagnosis not present

## 2019-01-19 DIAGNOSIS — R262 Difficulty in walking, not elsewhere classified: Secondary | ICD-10-CM | POA: Diagnosis not present

## 2019-01-20 DIAGNOSIS — Z992 Dependence on renal dialysis: Secondary | ICD-10-CM | POA: Diagnosis not present

## 2019-01-20 DIAGNOSIS — N186 End stage renal disease: Secondary | ICD-10-CM | POA: Diagnosis not present

## 2019-01-20 DIAGNOSIS — R262 Difficulty in walking, not elsewhere classified: Secondary | ICD-10-CM | POA: Diagnosis not present

## 2019-01-20 DIAGNOSIS — N2581 Secondary hyperparathyroidism of renal origin: Secondary | ICD-10-CM | POA: Diagnosis not present

## 2019-01-20 DIAGNOSIS — R279 Unspecified lack of coordination: Secondary | ICD-10-CM | POA: Diagnosis not present

## 2019-01-20 DIAGNOSIS — D631 Anemia in chronic kidney disease: Secondary | ICD-10-CM | POA: Diagnosis not present

## 2019-01-20 DIAGNOSIS — D509 Iron deficiency anemia, unspecified: Secondary | ICD-10-CM | POA: Diagnosis not present

## 2019-01-20 DIAGNOSIS — E1129 Type 2 diabetes mellitus with other diabetic kidney complication: Secondary | ICD-10-CM | POA: Diagnosis not present

## 2019-01-21 DIAGNOSIS — R262 Difficulty in walking, not elsewhere classified: Secondary | ICD-10-CM | POA: Diagnosis not present

## 2019-01-21 DIAGNOSIS — E1129 Type 2 diabetes mellitus with other diabetic kidney complication: Secondary | ICD-10-CM | POA: Diagnosis not present

## 2019-01-21 DIAGNOSIS — R279 Unspecified lack of coordination: Secondary | ICD-10-CM | POA: Diagnosis not present

## 2019-01-22 DIAGNOSIS — N2581 Secondary hyperparathyroidism of renal origin: Secondary | ICD-10-CM | POA: Diagnosis not present

## 2019-01-22 DIAGNOSIS — Z992 Dependence on renal dialysis: Secondary | ICD-10-CM | POA: Diagnosis not present

## 2019-01-22 DIAGNOSIS — D509 Iron deficiency anemia, unspecified: Secondary | ICD-10-CM | POA: Diagnosis not present

## 2019-01-22 DIAGNOSIS — N186 End stage renal disease: Secondary | ICD-10-CM | POA: Diagnosis not present

## 2019-01-22 DIAGNOSIS — D631 Anemia in chronic kidney disease: Secondary | ICD-10-CM | POA: Diagnosis not present

## 2019-01-24 ENCOUNTER — Encounter (HOSPITAL_COMMUNITY)
Admission: RE | Admit: 2019-01-24 | Discharge: 2019-01-24 | Disposition: A | Discharge status: Home / Self Care | Payer: Medicare Other | Source: Skilled Nursing Facility | Attending: Internal Medicine | Admitting: Internal Medicine

## 2019-01-24 DIAGNOSIS — D509 Iron deficiency anemia, unspecified: Secondary | ICD-10-CM | POA: Diagnosis not present

## 2019-01-24 DIAGNOSIS — E1129 Type 2 diabetes mellitus with other diabetic kidney complication: Secondary | ICD-10-CM | POA: Insufficient documentation

## 2019-01-24 DIAGNOSIS — R262 Difficulty in walking, not elsewhere classified: Secondary | ICD-10-CM | POA: Diagnosis not present

## 2019-01-24 DIAGNOSIS — D631 Anemia in chronic kidney disease: Secondary | ICD-10-CM | POA: Diagnosis not present

## 2019-01-24 DIAGNOSIS — Z992 Dependence on renal dialysis: Secondary | ICD-10-CM | POA: Diagnosis not present

## 2019-01-24 DIAGNOSIS — R279 Unspecified lack of coordination: Secondary | ICD-10-CM | POA: Diagnosis not present

## 2019-01-24 DIAGNOSIS — I120 Hypertensive chronic kidney disease with stage 5 chronic kidney disease or end stage renal disease: Secondary | ICD-10-CM | POA: Insufficient documentation

## 2019-01-24 DIAGNOSIS — D500 Iron deficiency anemia secondary to blood loss (chronic): Secondary | ICD-10-CM | POA: Insufficient documentation

## 2019-01-24 DIAGNOSIS — N186 End stage renal disease: Secondary | ICD-10-CM | POA: Diagnosis not present

## 2019-01-24 DIAGNOSIS — N2581 Secondary hyperparathyroidism of renal origin: Secondary | ICD-10-CM | POA: Diagnosis not present

## 2019-01-25 ENCOUNTER — Encounter: Payer: Self-pay | Admitting: Internal Medicine

## 2019-01-25 ENCOUNTER — Non-Acute Institutional Stay (SKILLED_NURSING_FACILITY): Payer: Medicare Other | Admitting: Internal Medicine

## 2019-01-25 ENCOUNTER — Encounter (HOSPITAL_COMMUNITY)
Admission: RE | Admit: 2019-01-25 | Discharge: 2019-01-25 | Disposition: A | Discharge status: Home / Self Care | Payer: Medicare Other | Source: Skilled Nursing Facility | Attending: Internal Medicine | Admitting: Internal Medicine

## 2019-01-25 DIAGNOSIS — Z992 Dependence on renal dialysis: Secondary | ICD-10-CM | POA: Diagnosis not present

## 2019-01-25 DIAGNOSIS — N186 End stage renal disease: Secondary | ICD-10-CM | POA: Diagnosis not present

## 2019-01-25 DIAGNOSIS — I10 Essential (primary) hypertension: Secondary | ICD-10-CM | POA: Insufficient documentation

## 2019-01-25 DIAGNOSIS — E781 Pure hyperglyceridemia: Secondary | ICD-10-CM | POA: Diagnosis not present

## 2019-01-25 DIAGNOSIS — E119 Type 2 diabetes mellitus without complications: Secondary | ICD-10-CM | POA: Diagnosis not present

## 2019-01-25 DIAGNOSIS — R279 Unspecified lack of coordination: Secondary | ICD-10-CM | POA: Diagnosis not present

## 2019-01-25 DIAGNOSIS — Z794 Long term (current) use of insulin: Secondary | ICD-10-CM | POA: Diagnosis not present

## 2019-01-25 DIAGNOSIS — E1129 Type 2 diabetes mellitus with other diabetic kidney complication: Secondary | ICD-10-CM | POA: Diagnosis not present

## 2019-01-25 DIAGNOSIS — R262 Difficulty in walking, not elsewhere classified: Secondary | ICD-10-CM | POA: Insufficient documentation

## 2019-01-25 LAB — COMPREHENSIVE METABOLIC PANEL
ALT: 27 U/L (ref 0–44)
AST: 21 U/L (ref 15–41)
Albumin: 4 g/dL (ref 3.5–5.0)
Alkaline Phosphatase: 125 U/L (ref 38–126)
Anion gap: 12 (ref 5–15)
BUN: 25 mg/dL — ABNORMAL HIGH (ref 8–23)
CO2: 28 mmol/L (ref 22–32)
Calcium: 9 mg/dL (ref 8.9–10.3)
Chloride: 97 mmol/L — ABNORMAL LOW (ref 98–111)
Creatinine, Ser: 6.78 mg/dL — ABNORMAL HIGH (ref 0.61–1.24)
GFR calc Af Amer: 9 mL/min — ABNORMAL LOW (ref >60)
GFR calc non Af Amer: 8 mL/min — ABNORMAL LOW (ref >60)
Glucose, Bld: 334 mg/dL — ABNORMAL HIGH (ref 70–99)
Potassium: 4.7 mmol/L (ref 3.5–5.1)
Sodium: 137 mmol/L (ref 135–145)
Total Bilirubin: 0.5 mg/dL (ref 0.3–1.2)
Total Protein: 8 g/dL (ref 6.5–8.1)

## 2019-01-25 LAB — LDL CHOLESTEROL, DIRECT: Direct LDL: 16.3 mg/dL (ref 0–99)

## 2019-01-25 LAB — CBC
HCT: 40.8 % (ref 39.0–52.0)
Hemoglobin: 12.9 g/dL — ABNORMAL LOW (ref 13.0–17.0)
MCH: 34.9 pg — ABNORMAL HIGH (ref 26.0–34.0)
MCHC: 31.6 g/dL (ref 30.0–36.0)
MCV: 110.3 fL — ABNORMAL HIGH (ref 80.0–100.0)
Platelets: 146 10*3/uL — ABNORMAL LOW (ref 150–400)
RBC: 3.7 MIL/uL — ABNORMAL LOW (ref 4.22–5.81)
RDW: 15.7 % — ABNORMAL HIGH (ref 11.5–15.5)
WBC: 5.6 10*3/uL (ref 4.0–10.5)
nRBC: 0.4 % — ABNORMAL HIGH (ref 0.0–0.2)

## 2019-01-25 LAB — LIPID PANEL
Cholesterol: 94 mg/dL (ref 0–200)
HDL: 27 mg/dL — ABNORMAL LOW (ref >40)
LDL Cholesterol: UNDETERMINED mg/dL (ref 0–99)
Total CHOL/HDL Ratio: 3.5 RATIO
Triglycerides: 439 mg/dL — ABNORMAL HIGH (ref <150)
VLDL: UNDETERMINED mg/dL (ref 0–40)

## 2019-01-25 LAB — HEMOGLOBIN A1C
Hgb A1c MFr Bld: 6.9 % — ABNORMAL HIGH (ref 4.8–5.6)
Mean Plasma Glucose: 151.33 mg/dL

## 2019-01-25 NOTE — Progress Notes (Signed)
Location:    [name] Room Number: 112/D Place of Service:  SNF (31) Provider:  Veleta Miners MD  [name], MD  Patient Care Team: [name], MD as PCP - General (Internal Medicine) Cassandria Anger, MD as Consulting Physician (Endocrinology) Fran Lowes, MD as Consulting Physician (Nephrology) Gala Romney Cristopher Estimable, MD as Consulting Physician (Gastroenterology) [facility] [place] ([place])  Extended Emergency Contact Information Primary Emergency Contact: Betsey Amen Address: DSS  [place] Phone: [phone] Work Phone: [phone] Relation: Legal Guardian Secondary Emergency Contact: Kelliher,Richard Address: [address]          [place], [place] [address] [place] Phone: [phone] Relation: Brother  Code Status:  Full Code Goals of care: Advanced Directive information Advanced Directives 01/25/2019  Does Patient Have a Medical Advance Directive? Yes  Type of Advance Directive (No Data)  Does patient want to make changes to medical advance directive? No - Patient declined  Copy of Jolly in Chart? -  Would patient like information on creating a medical advance directive? -  Pre-existing out of facility DNR order (yellow form or pink MOST form) -     Chief Complaint  Patient presents with  . Acute Visit    Hyperglycemia    HPI:  Pt is a 73 y.o. 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 His family cannot  bring him outside food so he is snacking all the time His sugars are running more then 200 in the mornings for past 2 weeks Patient did not have any acute issues. No Fever or Chills or Cough  Past Medical History:  Diagnosis Date  . Abnormal CT scan, kidney 10/06/2011  . Acute pyelonephritis 10/07/2011  . Anemia    normocytic  . Anxiety    mental retardation  . Bladder wall thickening 10/06/2011  . BPH (benign prostatic hypertrophy)   . Diabetes mellitus   . Dialysis patient [facility])    Tuesday, Thursday and Saturday,   . Edema     history of lower extremity edema  . GERD (gastroesophageal reflux disease)   . Heme positive stool   . Hydronephrosis   . Hyperkalemia   . Hyperlipidemia   . Hypernatremia   . Hypertension   . Hypothyroidism   . Impaired speech   . 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 MR (mental retardation)   . Muscle weakness   . Obstructive uropathy   . 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 Poor historian poor historian  . Protein calorie malnutrition ([place])   . Pyelonephritis   . Renal failure (ARF), acute on chronic (HCC)   . Renal insufficiency    chronic history  . Sepsis ([place])   . Smoking   . Uremia   . Urinary retention   . UTI (lower urinary tract infection) 10/06/2011   Past Surgical History:  Procedure Laterality Date  . A/V FISTULAGRAM N/A 08/13/2018   Procedure: A/V FISTULAGRAM - Right Upper;  Surgeon: Elam Dutch, MD;  Location: [facility];  Service: Cardiovascular;  Laterality: N/A;  .  A/V FISTULAGRAM N/A 11/22/2018   Procedure: A/V FISTULAGRAM - Right Upper;  Surgeon: Waynetta Sandy, MD;  Location: [facility];  Service: Cardiovascular;  Laterality: N/A;  . AV FISTULA PLACEMENT Left 07/06/2015   Procedure:  INSERTION LEFT ARM ARTERIOVENOUS GORTEX GRAFT;  Surgeon: Angelia Mould, MD;  Location: [facility];  Service: Vascular;  Laterality: Left;  . AV FISTULA PLACEMENT Right 02/26/2016   Procedure:  ARTERIOVENOUS (AV) FISTULA CREATION ;  Surgeon: Angelia Mould, MD;  Location: [facility];  Service: Vascular;  Laterality: Right;  . AV FISTULA PLACEMENT Right 11/25/2018   Procedure: INSERTION OF ARTERIOVENOUS (AV) ARTEGRAFT RIGHT UPPER ARM;  Surgeon: Waynetta Sandy, MD;  Location: [place];  Service: Vascular;  Laterality: Right;  . [place] REMOVAL Left 10/09/2015   Procedure: REMOVAL OF ARTERIOVENOUS GORETEX GRAFT ([place]) Evacuation of Lymphocele, Vein Patch angioplasty of brachial artery.;  Surgeon: Angelia Mould, MD;  Location: [place];  Service: Vascular;  Laterality: Left;  . BASCILIC VEIN TRANSPOSITION Right 02/26/2016   Procedure: Right BASCILIC VEIN TRANSPOSITION;  Surgeon: Angelia Mould, MD;  Location: [facility];  Service: Vascular;  Laterality: Right;  . CIRCUMCISION N/A 01/04/2014   Procedure: CIRCUMCISION ADULT (procedure #1);  Surgeon: Marissa Nestle, MD;  Location[facility];  Service: Urology;  Laterality: N/A;  . COLECTOMY N/A 05/04/2017   Procedure: TOTAL COLECTOMY;  Surgeon: Aviva Signs, MD;  Location: [facility];  Service: General;  Laterality: N/A;  . COLONOSCOPY N/A 04/27/2017   Procedure: COLONOSCOPY;  Surgeon: Daneil Dolin, MD;  [facility];  Service: Endoscopy;  Laterality: N/A;  245  . CYSTOSCOPY W/ RETROGRADES Bilateral 06/29/2015   Procedure: CYSTOSCOPY, DILATION OF URETHRAL STRICTURE WITH BILATERAL RETROGRADE PYELOGRAM,SUPRAPUBIC TUBE CHANGE;  Surgeon: Festus Aloe, MD;  Location: [facility];  Service: Urology;  Laterality: Bilateral;  . CYSTOSCOPY WITH URETHRAL DILATATION N/A 12/29/2013   Procedure: CYSTOSCOPY WITH URETHRAL DILATATION;  Surgeon: Marissa Nestle, MD;  [facility];  Service: Urology;  Laterality: N/A;  . ESOPHAGOGASTRODUODENOSCOPY N/A 04/27/2017   Procedure: ESOPHAGOGASTRODUODENOSCOPY (EGD);  Surgeon: Daneil Dolin, MD;  [facility];  Service: Endoscopy;  Laterality: N/A;  . INSERTION OF DIALYSIS CATHETER Right  11/25/2018   Procedure: INSERTION OF DIALYSIS CATHETER RIGHT INTERNAL JUGULAR;  Surgeon: Waynetta Sandy, MD;  Location: [facility];  Service: Vascular;  Laterality: Right;  . IR AV DIALY SHUNT INTRO [place] W/PTA/IMG RIGHT Right 09/07/2018  . IR REMOVAL TUN CV CATH W/O FL  01/12/2019  . IR THROMBECTOMY AV FISTULA W/THROMBOLYSIS/PTA INC/SHUNT/IMG RIGHT Right 04/26/2018  . IR US GUIDE VASC ACCESS RIGHT  04/26/2018  . IR US GUIDE VASC ACCESS RIGHT  09/07/2018  . ORIF FEMUR FRACTURE Right 11/22/2016   Procedure: OPEN REDUCTION INTERNAL FIXATION (ORIF) DISTAL FEMUR FRACTURE;  Surgeon: Rod Can, MD;  Location: [place] [facility];  Service: Orthopedics;  Laterality: Right;  . PATCH ANGIOPLASTY Right 12/10/2017   Procedure: PATCH ANGIOPLASTY;  Surgeon: Angelia Mould, MD;  Location[facility];  Service: Vascular;  Laterality: Right;  . PERIPHERAL VASCULAR BALLOON ANGIOPLASTY  08/13/2018   Procedure: PERIPHERAL VASCULAR BALLOON ANGIOPLASTY;  Surgeon: Elam Dutch, MD;  Location: [facility];  Service: Cardiovascular;;  right AV fistula   . PERIPHERAL VASCULAR BALLOON ANGIOPLASTY  11/22/2018   Procedure: PERIPHERAL VASCULAR BALLOON ANGIOPLASTY;  Surgeon: Waynetta Sandy, MD;  Location: [place] [facility];  Service: Cardiovascular;;  rt AV fistula  . PERIPHERAL VASCULAR CATHETERIZATION N/A 10/08/2015  Procedure: A/V Shuntogram;  Surgeon: Angelia Mould, MD;  Location: [place] [facility];  Service: Cardiovascular;  Laterality: N/A;  . THROMBECTOMY W/ EMBOLECTOMY Right 12/10/2017   Procedure: THROMBECTOMY REVISION RIGHT ARM  ARTERIOVENOUS FISTULA;  Surgeon: Angelia Mould, MD;  Location: [place];  Service: Vascular;  Laterality: Right;  . TRANSURETHRAL RESECTION OF PROSTATE N/A 01/04/2014   Procedure: TRANSURETHRAL RESECTION OF THE PROSTATE (TURP) (procedure #2);  Surgeon: Marissa Nestle, MD;  [facility];  Service: Urology;  Laterality: N/A;    No  Known Allergies  Outpatient Encounter Medications as of 01/25/2019  Medication Sig  . atorvastatin (LIPITOR) 80 MG tablet Take 80 mg by mouth every evening.  . insulin aspart (NOVOLOG FLEXPEN) 100 UNIT/ML FlexPen Inject 5-8 Units into the skin 3 (three) times daily. Per sliding scale; < 60 = call MD, 200-300 = 5 units, > 300 = 8 units, > 400 = call MD  . Insulin Glargine (BASAGLAR KWIKPEN) 100 UNIT/ML SOPN Inject 22 Units into the skin at bedtime.   Marland Kitchen JANUVIA 25 MG tablet Take 25 mg by mouth daily.   Marland Kitchen levothyroxine (SYNTHROID, LEVOTHROID) 88 MCG tablet Take 88 mcg by mouth daily before breakfast.  . midodrine (PROAMATINE) 5 MG tablet Take 5 mg by mouth Every Tuesday,Thursday,and Saturday with dialysis.   . Multiple Vitamin (MULTIVITAMIN WITH MINERALS) TABS tablet Take 1 tablet by mouth every evening.   . NON FORMULARY Fluid Restriction - 1200 cc a day 500 cc for days, Evenings 500 cc, Night 200 cc  . NON FORMULARY Diet Type:  NAS, Cons CHO Diet  . Nutritional Supplements (NEPRO/CARBSTEADY PO) Take 1 Can by mouth 2 (two) times daily.  Marland Kitchen omeprazole (PRILOSEC) 40 MG capsule Take 40 mg by mouth at bedtime.   . polyethylene glycol (MIRALAX / GLYCOLAX) packet Take 17 g by mouth as needed for mild constipation.   . sevelamer carbonate (RENVELA) 800 MG tablet Take 800-1,600 mg by mouth See admin instructions. Take 1600 mg by mouth 3 times daily with meals and take 800 mg by mouth with snacks  . tamsulosin (FLOMAX) 0.4 MG CAPS capsule Take 0.4 mg by mouth every evening. Give 30 minutes after a meal  . torsemide (DEMADEX) 10 MG tablet Take 5 tablets (50 mg total) by mouth daily.   No facility-administered encounter medications on file as of 01/25/2019.     Review of Systems  Review of Systems  Constitutional: Negative for activity change, appetite change, chills, diaphoresis, fatigue and fever.  HENT: Negative for mouth sores, postnasal drip, rhinorrhea, sinus pain and sore throat.   Respiratory:  Negative for apnea, cough, chest tightness, shortness of breath and wheezing.   Cardiovascular: Negative for chest pain, palpitations and leg swelling.  Gastrointestinal: Negative for abdominal distention, abdominal pain, constipation, diarrhea, nausea and vomiting.  Genitourinary: Negative for dysuria and frequency.  Musculoskeletal: Negative for arthralgias, joint swelling and myalgias.  Skin: Negative for rash.  Neurological: Negative for dizziness, syncope, weakness, light-headedness and numbness.  Psychiatric/Behavioral: Negative for behavioral problems, confusion and sleep disturbance.     Immunization History  Administered Date(s) Administered  . Influenza,inj,Quad PF,6+ Mos 12/26/2013, 07/09/2015  . Influenza-Unspecified 07/31/2017, 07/29/2018  . PPD Test 03/07/2015  . Pneumococcal Conjugate-13 07/31/2017  . Pneumococcal Polysaccharide-23 03/04/2015  . Pneumococcal-Unspecified 12/03/2016  . Tdap 08/22/2017   Pertinent  Health Maintenance Due  Topic Date Due  . URINE MICROALBUMIN  02/24/2019 (Originally 08/23/1956)  . HEMOGLOBIN A1C  04/24/2019  . FOOT EXAM  05/20/2019  .  OPHTHALMOLOGY EXAM  09/01/2019  . COLONOSCOPY  04/28/2027  . INFLUENZA VACCINE  Completed  . PNA vac Low Risk Adult  Completed   Fall Risk  02/18/2018  Falls in the past year? No   Functional Status Survey:    Vitals:   01/25/19 1526  BP: 90/61  Pulse: 90  Resp: 20  Temp: 97.6 F (36.4 C)   There is no height or weight on file to calculate BMI. Physical Exam Vitals signs reviewed.  Constitutional:      Appearance: Normal appearance. He is well-developed.  HENT:     Head: Normocephalic.     Nose: Nose normal.     Mouth/Throat:     Mouth: Mucous membranes are moist.     Pharynx: Oropharynx is clear.  Eyes:     Pupils: Pupils are equal, round, and reactive to light.  Neck:     Musculoskeletal: Neck supple.  Cardiovascular:     Rate and Rhythm: Normal rate.     Heart sounds: Normal  heart sounds.  Pulmonary:     Effort: Pulmonary effort is normal. No respiratory distress.     Breath sounds: Normal breath sounds. No wheezing.  Abdominal:     General: Bowel sounds are normal. There is no distension.     Palpations: Abdomen is soft.     Tenderness: There is no abdominal tenderness. There is no rebound.  Skin:    General: Skin is warm and dry.  Neurological:     Mental Status: He is alert.     Comments: Is mostly Wheelchair Bound. He can walk with [place] Rile and is independent in his transfers  Psychiatric:        Behavior: Behavior normal.        Thought Content: Thought content normal.     Labs reviewed: Recent Labs    10/23/18 0504 11/22/18 1048 11/25/18 0929 01/25/19 0700  NA 143 138 142 137  K 5.3* 3.9 4.1 4.7  CL 113* 99  --  97*  CO2 24 26  --  28  GLUCOSE 150* 152* 116* 334*  BUN 43* 40*  --  25*  CREATININE 8.29* 9.66*  --  6.78*  CALCIUM 8.9 9.1  --  9.0   Recent Labs    01/25/19 0700  AST 21  ALT 27  ALKPHOS 125  BILITOT 0.5  PROT 8.0  ALBUMIN 4.0   Recent Labs    04/22/18 0718  10/23/18 0504 11/22/18 1047 11/25/18 0929 01/25/19 0700  WBC 6.6   < > 5.4 5.6  --  5.6  NEUTROABS 3.5  --  2.3  --   --   --   HGB 11.7*   < > 11.5* 11.0* 11.2* 12.9*  HCT 36.0*   < > 37.7* 34.9* 33.0* 40.8  MCV 105.6*   < > 109.0* 103.9*  --  110.3*  PLT 142*   < > 145* 102*  --  146*   < > = values in this interval not displayed.   Lab Results  Component Value Date   TSH 2.752 12/06/2018   Lab Results  Component Value Date   HGBA1C 6.9 (H) 01/25/2019   Lab Results  Component Value Date   CHOL 94 01/25/2019   HDL 27 (L) 01/25/2019   LDLCALC UNABLE TO CALCULATE IF TRIGLYCERIDE OVER 400 mg/dL 01/25/2019   LDLDIRECT 16.3 01/25/2019   TRIG 439 (H) 01/25/2019   CHOLHDL 3.5 01/25/2019    Significant Diagnostic Results in last 30 days:  Ir Removal Tun Cv Cath W/o Fl  Result Date: 01/12/2019 INDICATION: Patient with history of ESRD on HD now  with working AVF. Request for removal of right tunneled IJ catheter today in IR. EXAM: REMOVAL OF TUNNELED HEMODIALYSIS CATHETER MEDICATIONS: 5 mL 1% lidocaine. COMPLICATIONS: None immediate. PROCEDURE: Informed written consent was obtained from the patient following an explanation of the procedure, risks, benefits and alternatives to treatment. A time out was performed prior to the initiation of the procedure. Maximal barrier sterile technique was utilized including caps, mask, sterile gloves, large sterile drape, hand hygiene, and Hibiclens. 1% lidocaine was injected under sterile conditions along the subcutaneous tunnel. Utilizing a combination of blunt dissection and gentle traction, the catheter was removed intact. Hemostasis was obtained with manual compression. A dressing was placed. The patient tolerated the procedure well without immediate post procedural complication. IMPRESSION: Successful removal of tunneled dialysis catheter. Read by Candiss Norse, PA-C Electronically Signed   By: Jacqulynn Cadet M.D.   On: 01/12/2019 12:32    Assessment/Plan Type 2 diabetes mellitus His BS are running more then 200 most of the day and 300 this morning Will Increase his Lantus to 24 units Also decrease the Novolog bolus to 8 units for 150-300 And 10 units for 301-400 As he is dropping his BS too muchafter the boluses  His A1C looks better Told him again to avoid Snacks but patient usually ignores our advice Eye Exam and Foot exam done in 10/19 Continue on Januvia Mixed hyperlipidemia He has high triglycerides but mostly less then 500 On increase dose of Lipitor D/W him again to follow more restrictive diet Will start on Fish oil 4 gm QD  His other issues reviewed and are stable  ESRD On Dialysis Midodrine for BP Also on Demadex per Nephrology  BPH Continue on Flomax  S/P colectomy Appetite has improved and is doing well with his weight Benign prostatic hyperplasia  On Flomax  Hypothyroid Continue same dose of Synthroid TSH normal in 02/20  Macrocytosis Vit B12 normal Patientabdominal CT scan showedin 03/19 14 mm low-density/cystic pancreatic head lesion. Follow-up pancreatic protocol abdominal CT is recommended in 2 years     Family/ staff Communication:   Labs/tests ordered:   Total time spent in this patient care encounter was _25 minutes; greater than 50% of the visit spent counseling patient, reviewing records , Labs and coordinating care for problems addressed at this encounter.

## 2019-01-26 DIAGNOSIS — R262 Difficulty in walking, not elsewhere classified: Secondary | ICD-10-CM | POA: Diagnosis not present

## 2019-01-26 DIAGNOSIS — R279 Unspecified lack of coordination: Secondary | ICD-10-CM | POA: Diagnosis not present

## 2019-01-26 DIAGNOSIS — N186 End stage renal disease: Secondary | ICD-10-CM | POA: Diagnosis not present

## 2019-01-26 DIAGNOSIS — N2581 Secondary hyperparathyroidism of renal origin: Secondary | ICD-10-CM | POA: Diagnosis not present

## 2019-01-26 DIAGNOSIS — D509 Iron deficiency anemia, unspecified: Secondary | ICD-10-CM | POA: Diagnosis not present

## 2019-01-26 DIAGNOSIS — E1129 Type 2 diabetes mellitus with other diabetic kidney complication: Secondary | ICD-10-CM | POA: Diagnosis not present

## 2019-01-26 DIAGNOSIS — Z992 Dependence on renal dialysis: Secondary | ICD-10-CM | POA: Diagnosis not present

## 2019-01-26 DIAGNOSIS — D631 Anemia in chronic kidney disease: Secondary | ICD-10-CM | POA: Diagnosis not present

## 2019-01-28 DIAGNOSIS — R262 Difficulty in walking, not elsewhere classified: Secondary | ICD-10-CM | POA: Diagnosis not present

## 2019-01-28 DIAGNOSIS — Z992 Dependence on renal dialysis: Secondary | ICD-10-CM | POA: Diagnosis not present

## 2019-01-28 DIAGNOSIS — D631 Anemia in chronic kidney disease: Secondary | ICD-10-CM | POA: Diagnosis not present

## 2019-01-28 DIAGNOSIS — N186 End stage renal disease: Secondary | ICD-10-CM | POA: Diagnosis not present

## 2019-01-28 DIAGNOSIS — E1129 Type 2 diabetes mellitus with other diabetic kidney complication: Secondary | ICD-10-CM | POA: Diagnosis not present

## 2019-01-28 DIAGNOSIS — R279 Unspecified lack of coordination: Secondary | ICD-10-CM | POA: Diagnosis not present

## 2019-01-28 DIAGNOSIS — D509 Iron deficiency anemia, unspecified: Secondary | ICD-10-CM | POA: Diagnosis not present

## 2019-01-28 DIAGNOSIS — N2581 Secondary hyperparathyroidism of renal origin: Secondary | ICD-10-CM | POA: Diagnosis not present

## 2019-01-28 DIAGNOSIS — F4322 Adjustment disorder with anxiety: Secondary | ICD-10-CM | POA: Diagnosis not present

## 2019-01-28 DIAGNOSIS — F70 Mild intellectual disabilities: Secondary | ICD-10-CM | POA: Diagnosis not present

## 2019-01-31 DIAGNOSIS — E1129 Type 2 diabetes mellitus with other diabetic kidney complication: Secondary | ICD-10-CM | POA: Diagnosis not present

## 2019-01-31 DIAGNOSIS — N186 End stage renal disease: Secondary | ICD-10-CM | POA: Diagnosis not present

## 2019-01-31 DIAGNOSIS — D509 Iron deficiency anemia, unspecified: Secondary | ICD-10-CM | POA: Diagnosis not present

## 2019-01-31 DIAGNOSIS — Z992 Dependence on renal dialysis: Secondary | ICD-10-CM | POA: Diagnosis not present

## 2019-01-31 DIAGNOSIS — R262 Difficulty in walking, not elsewhere classified: Secondary | ICD-10-CM | POA: Diagnosis not present

## 2019-01-31 DIAGNOSIS — N2581 Secondary hyperparathyroidism of renal origin: Secondary | ICD-10-CM | POA: Diagnosis not present

## 2019-01-31 DIAGNOSIS — R279 Unspecified lack of coordination: Secondary | ICD-10-CM | POA: Diagnosis not present

## 2019-01-31 DIAGNOSIS — D631 Anemia in chronic kidney disease: Secondary | ICD-10-CM | POA: Diagnosis not present

## 2019-02-01 DIAGNOSIS — R262 Difficulty in walking, not elsewhere classified: Secondary | ICD-10-CM | POA: Diagnosis not present

## 2019-02-01 DIAGNOSIS — E1129 Type 2 diabetes mellitus with other diabetic kidney complication: Secondary | ICD-10-CM | POA: Diagnosis not present

## 2019-02-01 DIAGNOSIS — R279 Unspecified lack of coordination: Secondary | ICD-10-CM | POA: Diagnosis not present

## 2019-02-02 DIAGNOSIS — N186 End stage renal disease: Secondary | ICD-10-CM | POA: Diagnosis not present

## 2019-02-02 DIAGNOSIS — D509 Iron deficiency anemia, unspecified: Secondary | ICD-10-CM | POA: Diagnosis not present

## 2019-02-02 DIAGNOSIS — E1129 Type 2 diabetes mellitus with other diabetic kidney complication: Secondary | ICD-10-CM | POA: Diagnosis not present

## 2019-02-02 DIAGNOSIS — N2581 Secondary hyperparathyroidism of renal origin: Secondary | ICD-10-CM | POA: Diagnosis not present

## 2019-02-02 DIAGNOSIS — Z992 Dependence on renal dialysis: Secondary | ICD-10-CM | POA: Diagnosis not present

## 2019-02-02 DIAGNOSIS — R279 Unspecified lack of coordination: Secondary | ICD-10-CM | POA: Diagnosis not present

## 2019-02-02 DIAGNOSIS — D631 Anemia in chronic kidney disease: Secondary | ICD-10-CM | POA: Diagnosis not present

## 2019-02-02 DIAGNOSIS — R262 Difficulty in walking, not elsewhere classified: Secondary | ICD-10-CM | POA: Diagnosis not present

## 2019-02-03 DIAGNOSIS — E1129 Type 2 diabetes mellitus with other diabetic kidney complication: Secondary | ICD-10-CM | POA: Diagnosis not present

## 2019-02-03 DIAGNOSIS — R279 Unspecified lack of coordination: Secondary | ICD-10-CM | POA: Diagnosis not present

## 2019-02-03 DIAGNOSIS — R262 Difficulty in walking, not elsewhere classified: Secondary | ICD-10-CM | POA: Diagnosis not present

## 2019-02-04 DIAGNOSIS — N2581 Secondary hyperparathyroidism of renal origin: Secondary | ICD-10-CM | POA: Diagnosis not present

## 2019-02-04 DIAGNOSIS — D631 Anemia in chronic kidney disease: Secondary | ICD-10-CM | POA: Diagnosis not present

## 2019-02-04 DIAGNOSIS — D509 Iron deficiency anemia, unspecified: Secondary | ICD-10-CM | POA: Diagnosis not present

## 2019-02-04 DIAGNOSIS — N186 End stage renal disease: Secondary | ICD-10-CM | POA: Diagnosis not present

## 2019-02-04 DIAGNOSIS — R262 Difficulty in walking, not elsewhere classified: Secondary | ICD-10-CM | POA: Diagnosis not present

## 2019-02-04 DIAGNOSIS — Z992 Dependence on renal dialysis: Secondary | ICD-10-CM | POA: Diagnosis not present

## 2019-02-04 DIAGNOSIS — E1129 Type 2 diabetes mellitus with other diabetic kidney complication: Secondary | ICD-10-CM | POA: Diagnosis not present

## 2019-02-04 DIAGNOSIS — R279 Unspecified lack of coordination: Secondary | ICD-10-CM | POA: Diagnosis not present

## 2019-02-07 DIAGNOSIS — N186 End stage renal disease: Secondary | ICD-10-CM | POA: Diagnosis not present

## 2019-02-07 DIAGNOSIS — R279 Unspecified lack of coordination: Secondary | ICD-10-CM | POA: Diagnosis not present

## 2019-02-07 DIAGNOSIS — Z992 Dependence on renal dialysis: Secondary | ICD-10-CM | POA: Diagnosis not present

## 2019-02-07 DIAGNOSIS — D631 Anemia in chronic kidney disease: Secondary | ICD-10-CM | POA: Diagnosis not present

## 2019-02-07 DIAGNOSIS — R262 Difficulty in walking, not elsewhere classified: Secondary | ICD-10-CM | POA: Diagnosis not present

## 2019-02-07 DIAGNOSIS — N2581 Secondary hyperparathyroidism of renal origin: Secondary | ICD-10-CM | POA: Diagnosis not present

## 2019-02-07 DIAGNOSIS — E119 Type 2 diabetes mellitus without complications: Secondary | ICD-10-CM | POA: Diagnosis not present

## 2019-02-07 DIAGNOSIS — E1129 Type 2 diabetes mellitus with other diabetic kidney complication: Secondary | ICD-10-CM | POA: Diagnosis not present

## 2019-02-07 DIAGNOSIS — D509 Iron deficiency anemia, unspecified: Secondary | ICD-10-CM | POA: Diagnosis not present

## 2019-02-08 DIAGNOSIS — R262 Difficulty in walking, not elsewhere classified: Secondary | ICD-10-CM | POA: Diagnosis not present

## 2019-02-08 DIAGNOSIS — R279 Unspecified lack of coordination: Secondary | ICD-10-CM | POA: Diagnosis not present

## 2019-02-08 DIAGNOSIS — E1129 Type 2 diabetes mellitus with other diabetic kidney complication: Secondary | ICD-10-CM | POA: Diagnosis not present

## 2019-02-09 ENCOUNTER — Non-Acute Institutional Stay (SKILLED_NURSING_FACILITY): Payer: Medicare Other | Admitting: Adult Health

## 2019-02-09 ENCOUNTER — Encounter: Payer: Self-pay | Admitting: Adult Health

## 2019-02-09 DIAGNOSIS — N2581 Secondary hyperparathyroidism of renal origin: Secondary | ICD-10-CM | POA: Diagnosis not present

## 2019-02-09 DIAGNOSIS — D631 Anemia in chronic kidney disease: Secondary | ICD-10-CM | POA: Diagnosis not present

## 2019-02-09 DIAGNOSIS — R262 Difficulty in walking, not elsewhere classified: Secondary | ICD-10-CM | POA: Diagnosis not present

## 2019-02-09 DIAGNOSIS — E1165 Type 2 diabetes mellitus with hyperglycemia: Secondary | ICD-10-CM

## 2019-02-09 DIAGNOSIS — Z992 Dependence on renal dialysis: Secondary | ICD-10-CM | POA: Diagnosis not present

## 2019-02-09 DIAGNOSIS — C189 Malignant neoplasm of colon, unspecified: Secondary | ICD-10-CM

## 2019-02-09 DIAGNOSIS — D509 Iron deficiency anemia, unspecified: Secondary | ICD-10-CM | POA: Diagnosis not present

## 2019-02-09 DIAGNOSIS — N186 End stage renal disease: Secondary | ICD-10-CM | POA: Diagnosis not present

## 2019-02-09 DIAGNOSIS — E1122 Type 2 diabetes mellitus with diabetic chronic kidney disease: Secondary | ICD-10-CM | POA: Diagnosis not present

## 2019-02-09 DIAGNOSIS — IMO0002 Reserved for concepts with insufficient information to code with codable children: Secondary | ICD-10-CM

## 2019-02-09 DIAGNOSIS — R279 Unspecified lack of coordination: Secondary | ICD-10-CM | POA: Diagnosis not present

## 2019-02-09 DIAGNOSIS — E1129 Type 2 diabetes mellitus with other diabetic kidney complication: Secondary | ICD-10-CM | POA: Diagnosis not present

## 2019-02-09 NOTE — Progress Notes (Signed)
Location:   [facility] Room Number: 112 D Place of Service:  SNF (31)   CODE STATUS: Full code  No Known Allergies  Chief Complaint  Patient presents with  . Acute Visit    Care Plan Meeting    HPI:  We have come together for his routine care plan meeting there are no family members present. He has scored 8/15 on his BIMS. He is working with therapy for ambulation. He continues with dialysis three days weekly. There are no reports of falls present. No reports of significant weight change. There are no reports of uncontrolled pain;no reports of anxiety or depressive thoughts. There are no reports of fevers present.   Past Medical History:  Diagnosis Date  . Abnormal CT scan, kidney 10/06/2011  . Acute pyelonephritis 10/07/2011  . Anemia    normocytic  . Anxiety    mental retardation  . Bladder wall thickening 10/06/2011  . BPH (benign prostatic hypertrophy)   . Diabetes mellitus   . Dialysis patient [facility])    Tuesday, Thursday and Saturday,   . Edema     history of lower extremity edema  . GERD (gastroesophageal reflux disease)   . Heme positive stool   . Hydronephrosis   . Hyperkalemia   . Hyperlipidemia   . Hypernatremia   . Hypertension   . Hypothyroidism   . Impaired speech   . 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 MR (mental retardation)   . Muscle weakness   . Obstructive uropathy   . 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 Poor historian poor historian  . Protein calorie malnutrition ([place])   . Pyelonephritis   . Renal failure (ARF), acute on chronic (HCC)   . Renal insufficiency    chronic history  . Sepsis ([place])   . Smoking   . Uremia   . Urinary retention   . UTI (lower urinary tract infection) 10/06/2011    Past Surgical History:  Procedure Laterality Date  . A/V FISTULAGRAM N/A 08/13/2018   Procedure: A/V FISTULAGRAM - Right Upper;  Surgeon: Elam Dutch, MD;  Location: [place] CV LAB;  Service:  Cardiovascular;  Laterality: N/A;  . A/V FISTULAGRAM N/A 11/22/2018   Procedure: A/V FISTULAGRAM - Right Upper;  Surgeon: Waynetta Sandy, MD;  Location: [place] CV LAB;  Service: Cardiovascular;  Laterality: N/A;  . AV FISTULA PLACEMENT Left 07/06/2015   Procedure:  INSERTION LEFT ARM ARTERIOVENOUS GORTEX GRAFT;  Surgeon: Angelia Mould, MD;  Location: [place];  Service: Vascular;  Laterality: Left;  . AV FISTULA PLACEMENT Right 02/26/2016   Procedure: ARTERIOVENOUS (AV) FISTULA CREATION ;  Surgeon: Angelia Mould, MD;  Location: [place];  Service: Vascular;  Laterality: Right;  . AV FISTULA PLACEMENT Right 11/25/2018   Procedure: INSERTION OF ARTERIOVENOUS (AV) ARTEGRAFT RIGHT UPPER ARM;  Surgeon: Waynetta Sandy, MD;  Location: [place];  Service: Vascular;  Laterality: Right;  . [place] REMOVAL Left 10/09/2015   Procedure: REMOVAL OF ARTERIOVENOUS GORETEX GRAFT ([place]) Evacuation of Lymphocele, Vein Patch angioplasty of brachial artery.;  Surgeon: Angelia Mould, MD;  Location: [place];  Service: Vascular;  Laterality: Left;  . BASCILIC VEIN TRANSPOSITION Right 02/26/2016   Procedure: Right BASCILIC VEIN TRANSPOSITION;  Surgeon: Angelia Mould, MD;  Location: [place];  Service: Vascular;  Laterality: Right;  . CIRCUMCISION N/A 01/04/2014   Procedure: CIRCUMCISION ADULT (procedure #1);  Surgeon: Marissa Nestle, MD;  [facility];  Service: Urology;  Laterality: N/A;  . COLECTOMY N/A 05/04/2017   Procedure: TOTAL COLECTOMY;  Surgeon: Aviva Signs, MD;  Location[facility];  Service: General;  Laterality: N/A;  . COLONOSCOPY N/A 04/27/2017   Procedure: COLONOSCOPY;  Surgeon: Daneil Dolin, MD;  [facility];  Service: Endoscopy;  Laterality: N/A;  245  . CYSTOSCOPY W/ RETROGRADES Bilateral 06/29/2015   Procedure: CYSTOSCOPY, DILATION OF URETHRAL STRICTURE WITH BILATERAL RETROGRADE PYELOGRAM,SUPRAPUBIC TUBE CHANGE;  Surgeon: Festus Aloe, MD;  Location:  [facility];  Service: Urology;  Laterality: Bilateral;  . CYSTOSCOPY WITH URETHRAL DILATATION N/A 12/29/2013   Procedure: CYSTOSCOPY WITH URETHRAL DILATATION;  Surgeon: Marissa Nestle, MD;  [facility];  Service: Urology;  Laterality: N/A;  . ESOPHAGOGASTRODUODENOSCOPY N/A 04/27/2017   Procedure: ESOPHAGOGASTRODUODENOSCOPY (EGD);  Surgeon: Daneil Dolin, MD;  [facility];  Service: Endoscopy;  Laterality: N/A;  . INSERTION OF DIALYSIS CATHETER Right 11/25/2018   Procedure: INSERTION OF DIALYSIS CATHETER RIGHT INTERNAL JUGULAR;  Surgeon: Waynetta Sandy, MD;  Location: [place];  Service: Vascular;  Laterality: Right;  . IR AV DIALY SHUNT INTRO [place] W/PTA/IMG RIGHT Right 09/07/2018  . IR REMOVAL TUN CV CATH W/O FL  01/12/2019  . IR THROMBECTOMY AV FISTULA W/THROMBOLYSIS/PTA INC/SHUNT/IMG RIGHT Right 04/26/2018  . IR US GUIDE VASC ACCESS RIGHT  04/26/2018  . IR US GUIDE VASC ACCESS RIGHT  09/07/2018  . ORIF FEMUR FRACTURE Right 11/22/2016   Procedure: OPEN REDUCTION INTERNAL FIXATION (ORIF) DISTAL FEMUR FRACTURE;  Surgeon: Rod Can, MD;  Location: [place];  Service: Orthopedics;  Laterality: Right;  . PATCH ANGIOPLASTY Right 12/10/2017   Procedure: PATCH ANGIOPLASTY;  Surgeon: Angelia Mould, MD;  Location: [facility];  Service: Vascular;  Laterality: Right;  . PERIPHERAL VASCULAR BALLOON ANGIOPLASTY  08/13/2018   Procedure: PERIPHERAL VASCULAR BALLOON ANGIOPLASTY;  Surgeon: Elam Dutch, MD;  Location: [place] [facility];  Service: Cardiovascular;;  right AV fistula   . PERIPHERAL VASCULAR BALLOON ANGIOPLASTY  11/22/2018   Procedure: PERIPHERAL VASCULAR BALLOON ANGIOPLASTY;  Surgeon: Waynetta Sandy, MD;  Location: [place] CV LAB;  Service: Cardiovascular;;  rt AV fistula  . PERIPHERAL VASCULAR CATHETERIZATION N/A 10/08/2015   Procedure: A/V Shuntogram;  Surgeon: Angelia Mould, MD;  Location: [place] [facility];  Service:  Cardiovascular;  Laterality: N/A;  . THROMBECTOMY W/ EMBOLECTOMY Right 12/10/2017   Procedure: THROMBECTOMY REVISION RIGHT ARM  ARTERIOVENOUS FISTULA;  Surgeon: Angelia Mould, MD;  Location: [place];  Service: Vascular;  Laterality: Right;  . TRANSURETHRAL RESECTION OF PROSTATE N/A 01/04/2014   Procedure: TRANSURETHRAL RESECTION OF THE PROSTATE (TURP) (procedure #2);  Surgeon: Marissa Nestle, MD;  [facility];  Service: Urology;  Laterality: N/A;    Social History   Socioeconomic History  . Marital status: Single    Spouse name: Not on file  . Number of children: Not on file  . Years of education: Not on file  . Highest education level: Not on file  Occupational History  . Not on file  Social Needs  . Financial resource strain: Not hard at all  . Food insecurity:    Worry: Never true    Inability: Never true  . Transportation needs:    Medical: No    Non-medical: No  Tobacco Use  . Smoking status: Never Smoker  . Smokeless tobacco: Never Used  Substance and Sexual Activity  . Alcohol use: No  . Drug use: No  . Sexual activity: Never  Lifestyle  .  Physical activity:    Days per week: 0 days    Minutes per session: Not on file  . Stress: Not at all  Relationships  . Social connections:    Talks on phone: More than three times a week    Gets together: More than three times a week    Attends religious service: Never    Active member of club or organization: No    Attends meetings of clubs or organizations: Never    Relationship status: Never married  . Intimate partner violence:    Fear of current or ex partner: No    Emotionally abused: No    Physically abused: No    Forced sexual activity: No  Other Topics Concern  . Not on file  Social History Narrative   Lives at nursing home.   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   Family History  Problem Relation Age of Onset  . Cancer Mother   . Colon cancer Neg Hx        VITAL SIGNS BP 99/64   Pulse 93   Temp 98.9 F (37.2 C)   Resp 20   Ht 5\' 8"  (1.727 m)   Wt 212 lb 3.2 oz (96.3 kg)   BMI 32.26 kg/m   Outpatient Encounter Medications as of 02/09/2019  Medication Sig  . atorvastatin (LIPITOR) 80 MG tablet Take 80 mg by mouth every evening.  . insulin aspart (NOVOLOG FLEXPEN) 100 UNIT/ML FlexPen Inject as Per sliding scale before meals:  < 60, call MD 200 - 300 = 8 units 301 - 400 = 10 units  . Insulin Glargine (BASAGLAR KWIKPEN) 100 UNIT/ML SOPN Inject 24 Units into the skin at bedtime.   Marland Kitchen JANUVIA 25 MG tablet Take 25 mg by mouth daily.   Marland Kitchen levothyroxine (SYNTHROID, LEVOTHROID) 88 MCG tablet Take 88 mcg by mouth daily before breakfast.  . midodrine (PROAMATINE) 5 MG tablet Take 5 mg by mouth every Monday, Wednesday, and Friday with hemodialysis. Send med with resident to dialysis.  Do not give at [facility], give to resident to take with hime  . Multiple Vitamin (MULTIVITAMIN WITH MINERALS) TABS tablet Take 1 tablet by mouth every evening.   . NON FORMULARY Fluid Restriction - 1200 cc a day 500 cc for days, Evenings 500 cc, Night 200 cc  . NON FORMULARY Diet Type:  NAS, Cons CHO Diet  . Nutritional Supplements (NEPRO/CARBSTEADY PO) Take 1 Can by mouth 2 (two) times daily.  Marland Kitchen omega-3 acid ethyl esters (LOVAZA) 1 g capsule Give 4 capsules by mouth once daily  . omeprazole (PRILOSEC) 40 MG capsule Take 40 mg by mouth at bedtime.   . polyethylene glycol (MIRALAX / GLYCOLAX) packet Take 17 g by mouth as needed for mild constipation.   . sevelamer carbonate (RENVELA) 800 MG tablet Take 800-1,600 mg by mouth See admin instructions. Take 1600 mg by mouth 3 times daily with meals and take 800 mg by mouth with snacks  . tamsulosin (FLOMAX) 0.4 MG CAPS capsule Take 0.4 mg by mouth every evening. Give 30 minutes after a meal  . torsemide (DEMADEX) 10 MG tablet Take 5 tablets (50 mg total) by mouth daily.   No facility-administered encounter medications  on file as of 02/09/2019.      SIGNIFICANT DIAGNOSTIC EXAMS  LABS REVIEWED TODAY;  12-06-18: tsh 2.752 01-25-19: wbc 5.6; hgb 12.9 hct 40.8; mcv 110.3; plt 146; glucose 334; bun 25; creat 6.78; k+ 4.7; na++ 137; ca 9.0  liver normal albumin 4.0  Hgb a1c 6.9; chol 94 trig 439  Review of Systems  Constitutional: Negative for malaise/fatigue.  Respiratory: Negative for cough and shortness of breath.   Cardiovascular: Negative for chest pain, palpitations and leg swelling.  Gastrointestinal: Negative for abdominal pain, constipation and heartburn.  Musculoskeletal: Negative for back pain, joint pain and myalgias.  Skin: Negative.   Neurological: Negative for dizziness.  Psychiatric/Behavioral: The patient is not nervous/anxious.      Physical Exam Constitutional:      General: He is not in acute distress.    Appearance: He is obese. He is not diaphoretic.  Neck:     Musculoskeletal: Neck supple.     Thyroid: No thyromegaly.  Cardiovascular:     Rate and Rhythm: Normal rate and regular rhythm.     Pulses: Normal pulses.     Heart sounds: Normal heart sounds.  Pulmonary:     Effort: Pulmonary effort is normal. No respiratory distress.     Breath sounds: Normal breath sounds.  Abdominal:     General: Bowel sounds are normal. There is no distension.     Palpations: Abdomen is soft.     Tenderness: There is no abdominal tenderness.  Musculoskeletal: Normal range of motion.     Right lower leg: No edema.     Left lower leg: No edema.  Lymphadenopathy:     Cervical: No cervical adenopathy.  Skin:    General: Skin is warm and dry.     Comments: Right upper extremity a/v fistula: + thrill + bruit   Neurological:     Mental Status: He is alert. Mental status is at baseline.  Psychiatric:        Mood and Affect: Mood normal.        ASSESSMENT/ PLAN:  TODAY   1. 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 End stage renal disease on dialysis due to  type 2 diabetes mellitus 3. Malignant neoplasm of colon unspecified part of colon  Will continue current plan of care Will continue current medication regimen Will continue current plan of care.   MD is aware of resident's narcotic use and is in agreement with current plan of care. We will attempt to wean resident as apropriate   Ok Edwards NP [facility]  Contact [phone] Monday through Friday 8am- 5pm  After hours call [phone]

## 2019-02-10 DIAGNOSIS — R279 Unspecified lack of coordination: Secondary | ICD-10-CM | POA: Diagnosis not present

## 2019-02-10 DIAGNOSIS — E1129 Type 2 diabetes mellitus with other diabetic kidney complication: Secondary | ICD-10-CM | POA: Diagnosis not present

## 2019-02-10 DIAGNOSIS — R262 Difficulty in walking, not elsewhere classified: Secondary | ICD-10-CM | POA: Diagnosis not present

## 2019-02-11 DIAGNOSIS — D509 Iron deficiency anemia, unspecified: Secondary | ICD-10-CM | POA: Diagnosis not present

## 2019-02-11 DIAGNOSIS — R262 Difficulty in walking, not elsewhere classified: Secondary | ICD-10-CM | POA: Diagnosis not present

## 2019-02-11 DIAGNOSIS — N186 End stage renal disease: Secondary | ICD-10-CM | POA: Diagnosis not present

## 2019-02-11 DIAGNOSIS — Z992 Dependence on renal dialysis: Secondary | ICD-10-CM | POA: Diagnosis not present

## 2019-02-11 DIAGNOSIS — N2581 Secondary hyperparathyroidism of renal origin: Secondary | ICD-10-CM | POA: Diagnosis not present

## 2019-02-11 DIAGNOSIS — R279 Unspecified lack of coordination: Secondary | ICD-10-CM | POA: Diagnosis not present

## 2019-02-11 DIAGNOSIS — E1129 Type 2 diabetes mellitus with other diabetic kidney complication: Secondary | ICD-10-CM | POA: Diagnosis not present

## 2019-02-11 DIAGNOSIS — D631 Anemia in chronic kidney disease: Secondary | ICD-10-CM | POA: Diagnosis not present

## 2019-02-13 DIAGNOSIS — Z992 Dependence on renal dialysis: Secondary | ICD-10-CM | POA: Insufficient documentation

## 2019-02-13 DIAGNOSIS — E1165 Type 2 diabetes mellitus with hyperglycemia: Secondary | ICD-10-CM | POA: Insufficient documentation

## 2019-02-13 DIAGNOSIS — E1122 Type 2 diabetes mellitus with diabetic chronic kidney disease: Secondary | ICD-10-CM | POA: Insufficient documentation

## 2019-02-13 DIAGNOSIS — IMO0002 Reserved for concepts with insufficient information to code with codable children: Secondary | ICD-10-CM | POA: Insufficient documentation

## 2019-02-13 DIAGNOSIS — N186 End stage renal disease: Secondary | ICD-10-CM

## 2019-02-14 ENCOUNTER — Encounter: Payer: Self-pay | Admitting: Adult Health

## 2019-02-14 ENCOUNTER — Non-Acute Institutional Stay (SKILLED_NURSING_FACILITY): Payer: Medicare Other | Admitting: Adult Health

## 2019-02-14 DIAGNOSIS — I951 Orthostatic hypotension: Secondary | ICD-10-CM | POA: Diagnosis not present

## 2019-02-14 DIAGNOSIS — E1169 Type 2 diabetes mellitus with other specified complication: Secondary | ICD-10-CM

## 2019-02-14 DIAGNOSIS — E1122 Type 2 diabetes mellitus with diabetic chronic kidney disease: Secondary | ICD-10-CM | POA: Diagnosis not present

## 2019-02-14 DIAGNOSIS — K219 Gastro-esophageal reflux disease without esophagitis: Secondary | ICD-10-CM

## 2019-02-14 DIAGNOSIS — E038 Other specified hypothyroidism: Secondary | ICD-10-CM

## 2019-02-14 DIAGNOSIS — Z992 Dependence on renal dialysis: Secondary | ICD-10-CM | POA: Diagnosis not present

## 2019-02-14 DIAGNOSIS — R600 Localized edema: Secondary | ICD-10-CM

## 2019-02-14 DIAGNOSIS — N2581 Secondary hyperparathyroidism of renal origin: Secondary | ICD-10-CM | POA: Diagnosis not present

## 2019-02-14 DIAGNOSIS — E785 Hyperlipidemia, unspecified: Secondary | ICD-10-CM

## 2019-02-14 DIAGNOSIS — IMO0002 Reserved for concepts with insufficient information to code with codable children: Secondary | ICD-10-CM

## 2019-02-14 DIAGNOSIS — D631 Anemia in chronic kidney disease: Secondary | ICD-10-CM | POA: Diagnosis not present

## 2019-02-14 DIAGNOSIS — N186 End stage renal disease: Secondary | ICD-10-CM | POA: Diagnosis not present

## 2019-02-14 DIAGNOSIS — D509 Iron deficiency anemia, unspecified: Secondary | ICD-10-CM | POA: Diagnosis not present

## 2019-02-14 DIAGNOSIS — E1165 Type 2 diabetes mellitus with hyperglycemia: Secondary | ICD-10-CM | POA: Diagnosis not present

## 2019-02-14 NOTE — Progress Notes (Signed)
Location:   [place] Room Number: 112 D Place of Service:  SNF (31)   CODE STATUS: Full Code  No Known Allergies  Chief Complaint  Patient presents with  . Medical Management of Chronic Issues    Orthostatic hypotension; gerd without esophagitis; uncontrolled type 2 diabetes melltius with end stage renal disease    HPI:  He is a 73 year old long term resident of this facility being seen for the management of his chronic illnesses; orthostatic hypotension; gerd diabetes. He denies any heart burn; no uncontrolled pain; no changes in appetite. There are no reports of fevers present.   Past Medical History:  Diagnosis Date  . Abnormal CT scan, kidney 10/06/2011  . Acute pyelonephritis 10/07/2011  . Anemia    normocytic  . Anxiety    mental retardation  . Bladder wall thickening 10/06/2011  . BPH (benign prostatic hypertrophy)   . Diabetes mellitus   . Dialysis patient [facility])    Tuesday, Thursday and Saturday,   . Edema     history of lower extremity edema  . GERD (gastroesophageal reflux disease)   . Heme positive stool   . Hydronephrosis   . Hyperkalemia   . Hyperlipidemia   . Hypernatremia   . Hypertension   . Hypothyroidism   . Impaired speech   . 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 MR (mental retardation)   . Muscle weakness   . Obstructive uropathy   . 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 Poor historian poor historian  . Protein calorie malnutrition ([place])   . Pyelonephritis   . Renal failure (ARF), acute on chronic (HCC)   . Renal insufficiency    chronic history  . Sepsis ([place])   . Smoking   . Uremia   . Urinary retention   . UTI (lower urinary tract infection) 10/06/2011    Past Surgical History:  Procedure Laterality Date  . A/V FISTULAGRAM N/A 08/13/2018   Procedure: A/V FISTULAGRAM - Right Upper;  Surgeon: Elam Dutch, MD;  Location: [place] [facility];  Service: Cardiovascular;  Laterality: N/A;  . A/V  FISTULAGRAM N/A 11/22/2018   Procedure: A/V FISTULAGRAM - Right Upper;  Surgeon: Waynetta Sandy, MD;  Location: [place] [facility];  Service: Cardiovascular;  Laterality: N/A;  . AV FISTULA PLACEMENT Left 07/06/2015   Procedure:  INSERTION LEFT ARM ARTERIOVENOUS GORTEX GRAFT;  Surgeon: Angelia Mould, MD;  Location: [place];  Service: Vascular;  Laterality: Left;  . AV FISTULA PLACEMENT Right 02/26/2016   Procedure: ARTERIOVENOUS (AV) FISTULA CREATION ;  Surgeon: Angelia Mould, MD;  Location: [place];  Service: Vascular;  Laterality: Right;  . AV FISTULA PLACEMENT Right 11/25/2018   Procedure: INSERTION OF ARTERIOVENOUS (AV) ARTEGRAFT RIGHT UPPER ARM;  Surgeon: Waynetta Sandy, MD;  Location: [place];  Service: Vascular;  Laterality: Right;  . [place] REMOVAL Left 10/09/2015   Procedure: REMOVAL OF ARTERIOVENOUS GORETEX GRAFT ([place]) Evacuation of Lymphocele, Vein Patch angioplasty of brachial artery.;  Surgeon: Angelia Mould, MD;  Location: [place];  Service: Vascular;  Laterality: Left;  . BASCILIC VEIN TRANSPOSITION Right 02/26/2016   Procedure: Right BASCILIC VEIN TRANSPOSITION;  Surgeon: Angelia Mould, MD;  Location: [place];  Service: Vascular;  Laterality: Right;  . CIRCUMCISION N/A 01/04/2014   Procedure: CIRCUMCISION ADULT (procedure #1);  Surgeon: Marissa Nestle, MD;  Location[facility];  Service: Urology;  Laterality: N/A;  . COLECTOMY N/A 05/04/2017   Procedure:  TOTAL COLECTOMY;  Surgeon: Aviva Signs, MD;  Location: [facility];  Service: General;  Laterality: N/A;  . COLONOSCOPY N/A 04/27/2017   Procedure: COLONOSCOPY;  Surgeon: Daneil Dolin, MD;  [facility];  Service: Endoscopy;  Laterality: N/A;  245  . CYSTOSCOPY W/ RETROGRADES Bilateral 06/29/2015   Procedure: CYSTOSCOPY, DILATION OF URETHRAL STRICTURE WITH BILATERAL RETROGRADE PYELOGRAM,SUPRAPUBIC TUBE CHANGE;  Surgeon: Festus Aloe, MD;  Location: [facility];  Service: Urology;  Laterality:  Bilateral;  . CYSTOSCOPY WITH URETHRAL DILATATION N/A 12/29/2013   Procedure: CYSTOSCOPY WITH URETHRAL DILATATION;  Surgeon: Marissa Nestle, MD;  [facility];  Service: Urology;  Laterality: N/A;  . ESOPHAGOGASTRODUODENOSCOPY N/A 04/27/2017   Procedure: ESOPHAGOGASTRODUODENOSCOPY (EGD);  Surgeon: Daneil Dolin, MD;  [facility];  Service: Endoscopy;  Laterality: N/A;  . INSERTION OF DIALYSIS CATHETER Right 11/25/2018   Procedure: INSERTION OF DIALYSIS CATHETER RIGHT INTERNAL JUGULAR;  Surgeon: Waynetta Sandy, MD;  Location: [place];  Service: Vascular;  Laterality: Right;  . IR AV DIALY SHUNT INTRO [place] W/PTA/IMG RIGHT Right 09/07/2018  . IR REMOVAL TUN CV CATH W/O FL  01/12/2019  . IR THROMBECTOMY AV FISTULA W/THROMBOLYSIS/PTA INC/SHUNT/IMG RIGHT Right 04/26/2018  . IR US GUIDE VASC ACCESS RIGHT  04/26/2018  . IR US GUIDE VASC ACCESS RIGHT  09/07/2018  . ORIF FEMUR FRACTURE Right 11/22/2016   Procedure: OPEN REDUCTION INTERNAL FIXATION (ORIF) DISTAL FEMUR FRACTURE;  Surgeon: Rod Can, MD;  Location: [place];  Service: Orthopedics;  Laterality: Right;  . PATCH ANGIOPLASTY Right 12/10/2017   Procedure: PATCH ANGIOPLASTY;  Surgeon: Angelia Mould, MD;  Location: [facility];  Service: Vascular;  Laterality: Right;  . PERIPHERAL VASCULAR BALLOON ANGIOPLASTY  08/13/2018   Procedure: PERIPHERAL VASCULAR BALLOON ANGIOPLASTY;  Surgeon: Elam Dutch, MD;  Location: [place] [facility];  Service: Cardiovascular;;  right AV fistula   . PERIPHERAL VASCULAR BALLOON ANGIOPLASTY  11/22/2018   Procedure: PERIPHERAL VASCULAR BALLOON ANGIOPLASTY;  Surgeon: Waynetta Sandy, MD;  Location: [place] [facility];  Service: Cardiovascular;;  rt AV fistula  . PERIPHERAL VASCULAR CATHETERIZATION N/A 10/08/2015   Procedure: A/V Shuntogram;  Surgeon: Angelia Mould, MD;  Location: [place] [facility];  Service: Cardiovascular;  Laterality: N/A;  .  THROMBECTOMY W/ EMBOLECTOMY Right 12/10/2017   Procedure: THROMBECTOMY REVISION RIGHT ARM  ARTERIOVENOUS FISTULA;  Surgeon: Angelia Mould, MD;  Location: [place];  Service: Vascular;  Laterality: Right;  . TRANSURETHRAL RESECTION OF PROSTATE N/A 01/04/2014   Procedure: TRANSURETHRAL RESECTION OF THE PROSTATE (TURP) (procedure #2);  Surgeon: Marissa Nestle, MD;  [facility];  Service: Urology;  Laterality: N/A;    Social History   Socioeconomic History  . Marital status: Single    Spouse name: Not on file  . Number of children: Not on file  . Years of education: Not on file  . Highest education level: Not on file  Occupational History  . Not on file  Social Needs  . Financial resource strain: Not hard at all  . Food insecurity:    Worry: Never true    Inability: Never true  . Transportation needs:    Medical: No    Non-medical: No  Tobacco Use  . Smoking status: Never Smoker  . Smokeless tobacco: Never Used  Substance and Sexual Activity  . Alcohol use: No  . Drug use: No  . Sexual activity: Never  Lifestyle  . Physical activity:    Days per week:  0 days    Minutes per session: Not on file  . Stress: Not at all  Relationships  . Social connections:    Talks on phone: More than three times a week    Gets together: More than three times a week    Attends religious service: Never    Active member of club or organization: No    Attends meetings of clubs or organizations: Never    Relationship status: Never married  . Intimate partner violence:    Fear of current or ex partner: No    Emotionally abused: No    Physically abused: No    Forced sexual activity: No  Other Topics Concern  . Not on file  Social History Narrative   Lives at nursing home.   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   Family History  Problem Relation Age of Onset  . Cancer Mother   . Colon cancer Neg Hx       VITAL SIGNS BP 119/73   Pulse  83   Temp 98.3 F (36.8 C)   Resp 20   Ht 5\' 8"  (1.727 m)   Wt 212 lb 3.2 oz (96.3 kg)   BMI 32.26 kg/m   Outpatient Encounter Medications as of 02/14/2019  Medication Sig  . atorvastatin (LIPITOR) 80 MG tablet Take 80 mg by mouth every evening.  . insulin aspart (NOVOLOG FLEXPEN) 100 UNIT/ML FlexPen Inject as Per sliding scale before meals:  < 60, call MD 200 - 300 = 8 units 301 - 400 = 10 units  . Insulin Glargine (BASAGLAR KWIKPEN) 100 UNIT/ML SOPN Inject 24 Units into the skin at bedtime.   Marland Kitchen JANUVIA 25 MG tablet Take 25 mg by mouth daily.   Marland Kitchen levothyroxine (SYNTHROID, LEVOTHROID) 88 MCG tablet Take 88 mcg by mouth daily before breakfast.  . midodrine (PROAMATINE) 5 MG tablet Take 5 mg by mouth every Monday, Wednesday, and Friday with hemodialysis. Send med with resident to dialysis.  Do not give at [facility], give to resident to take with hime  . Multiple Vitamin (MULTIVITAMIN WITH MINERALS) TABS tablet Take 1 tablet by mouth every evening.   . NON FORMULARY Fluid Restriction - 1200 cc a day 500 cc for days, Evenings 500 cc, Night 200 cc  . NON FORMULARY Diet Type:  NAS, Cons CHO Diet  . Nutritional Supplements (NEPRO/CARBSTEADY PO) Take 1 Can by mouth 2 (two) times daily.  Marland Kitchen omega-3 acid ethyl esters (LOVAZA) 1 g capsule Give 4 capsules by mouth once daily  . omeprazole (PRILOSEC) 40 MG capsule Take 40 mg by mouth at bedtime.   . polyethylene glycol (MIRALAX / GLYCOLAX) packet Take 17 g by mouth as needed for mild constipation.   . sevelamer carbonate (RENVELA) 800 MG tablet Take 800-1,600 mg by mouth See admin instructions. Take 1600 mg by mouth 3 times daily with meals and take 800 mg by mouth with snacks  . tamsulosin (FLOMAX) 0.4 MG CAPS capsule Take 0.4 mg by mouth every evening. Give 30 minutes after a meal  . torsemide (DEMADEX) 10 MG tablet Take 5 tablets (50 mg total) by mouth daily.   No facility-administered encounter medications on file as of 02/14/2019.       SIGNIFICANT DIAGNOSTIC EXAMS  LABS REVIEWED PREVIOUS;  12-06-18: tsh 2.752 01-25-19: wbc 5.6; hgb 12.9 hct 40.8; mcv 110.3; plt 146; glucose 334; bun 25; creat 6.78; k+ 4.7; na++ 137; ca 9.0 liver normal albumin 4.0  Hgb a1c 6.9;  chol 94 trig 439  NO NEW LABS.   Review of Systems  Constitutional: Negative for malaise/fatigue.  Respiratory: Negative for cough and shortness of breath.   Cardiovascular: Negative for chest pain, palpitations and leg swelling.  Gastrointestinal: Negative for abdominal pain, constipation and heartburn.  Musculoskeletal: Negative for back pain, joint pain and myalgias.  Skin: Negative.   Neurological: Negative for dizziness.  Psychiatric/Behavioral: The patient is not nervous/anxious.     Physical Exam Constitutional:      General: He is not in acute distress.    Appearance: He is obese. He is not diaphoretic.  Neck:     Musculoskeletal: Neck supple.     Thyroid: No thyromegaly.  Cardiovascular:     Rate and Rhythm: Normal rate and regular rhythm.     Pulses: Normal pulses.     Heart sounds: Normal heart sounds.  Pulmonary:     Effort: Pulmonary effort is normal. No respiratory distress.     Breath sounds: Normal breath sounds.  Abdominal:     General: Bowel sounds are normal. There is no distension.     Palpations: Abdomen is soft.     Tenderness: There is no abdominal tenderness.  Musculoskeletal: Normal range of motion.     Right lower leg: No edema.     Left lower leg: No edema.  Lymphadenopathy:     Cervical: No cervical adenopathy.  Skin:    General: Skin is warm and dry.     Comments: Right upper extremity a/v fistula: + thrill + bruit    Neurological:     Mental Status: He is alert. Mental status is at baseline.  Psychiatric:        Mood and Affect: Mood normal.      ASSESSMENT/ PLAN:  TODAY   1. Orthostatic hypotension: is stable b/p 119/73; will continue midodine 5 mg with dialysis  2. GERD without esophagitis: is stable  will continue prilosec 40 mg daily  3. Uncontrolled type 2 diabetes mellitus with end stage renal disease: stable hgb a1c 6.9; will continue basaglar 24 units nightly Novolog SSI; 200-300: 8 units; 301-400: 10 units; januvia 25 mg daily   4.  Dyslipidemia associated with type 2 diabetes mellitus: is stable trig 439; will continue lipitor 80 mg daily and lovaza 4 gm daily   5. End stage renal disease on hemodialysis due to type 2 diabetes mellitus: is stable is on hemodialysis three days weekly; is on 1200 cc fluid restriction; renvela 1600 mg with meals and 800 mg with snacks  6. Other specified hypothyroidism: is stable tsh 2.752; will continue synthroid 88 mcg daily   7. 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 Bilateral lower extremity edema; is stable will continue demadex 50 mg daily         MD is aware of resident's narcotic use and is in agreement with current plan of care. We will attempt to wean resident as apropriate   Ok Edwards NP [facility]  Contact [phone] Monday through Friday 8am- 5pm  After hours call [phone]

## 2019-02-16 DIAGNOSIS — Z992 Dependence on renal dialysis: Secondary | ICD-10-CM | POA: Diagnosis not present

## 2019-02-16 DIAGNOSIS — N186 End stage renal disease: Secondary | ICD-10-CM | POA: Diagnosis not present

## 2019-02-16 DIAGNOSIS — D509 Iron deficiency anemia, unspecified: Secondary | ICD-10-CM | POA: Diagnosis not present

## 2019-02-16 DIAGNOSIS — D631 Anemia in chronic kidney disease: Secondary | ICD-10-CM | POA: Diagnosis not present

## 2019-02-16 DIAGNOSIS — N2581 Secondary hyperparathyroidism of renal origin: Secondary | ICD-10-CM | POA: Diagnosis not present

## 2019-02-17 DIAGNOSIS — E785 Hyperlipidemia, unspecified: Secondary | ICD-10-CM

## 2019-02-17 DIAGNOSIS — K219 Gastro-esophageal reflux disease without esophagitis: Secondary | ICD-10-CM | POA: Insufficient documentation

## 2019-02-17 DIAGNOSIS — R600 Localized edema: Secondary | ICD-10-CM | POA: Insufficient documentation

## 2019-02-17 DIAGNOSIS — I951 Orthostatic hypotension: Secondary | ICD-10-CM | POA: Insufficient documentation

## 2019-02-17 DIAGNOSIS — E1169 Type 2 diabetes mellitus with other specified complication: Secondary | ICD-10-CM | POA: Insufficient documentation

## 2019-02-18 DIAGNOSIS — D509 Iron deficiency anemia, unspecified: Secondary | ICD-10-CM | POA: Diagnosis not present

## 2019-02-18 DIAGNOSIS — Z992 Dependence on renal dialysis: Secondary | ICD-10-CM | POA: Diagnosis not present

## 2019-02-18 DIAGNOSIS — D631 Anemia in chronic kidney disease: Secondary | ICD-10-CM | POA: Diagnosis not present

## 2019-02-18 DIAGNOSIS — N2581 Secondary hyperparathyroidism of renal origin: Secondary | ICD-10-CM | POA: Diagnosis not present

## 2019-02-18 DIAGNOSIS — N186 End stage renal disease: Secondary | ICD-10-CM | POA: Diagnosis not present

## 2019-02-21 DIAGNOSIS — Z992 Dependence on renal dialysis: Secondary | ICD-10-CM | POA: Diagnosis not present

## 2019-02-21 DIAGNOSIS — N2581 Secondary hyperparathyroidism of renal origin: Secondary | ICD-10-CM | POA: Diagnosis not present

## 2019-02-21 DIAGNOSIS — D631 Anemia in chronic kidney disease: Secondary | ICD-10-CM | POA: Diagnosis not present

## 2019-02-21 DIAGNOSIS — D509 Iron deficiency anemia, unspecified: Secondary | ICD-10-CM | POA: Diagnosis not present

## 2019-02-21 DIAGNOSIS — N186 End stage renal disease: Secondary | ICD-10-CM | POA: Diagnosis not present

## 2019-02-23 DIAGNOSIS — N2581 Secondary hyperparathyroidism of renal origin: Secondary | ICD-10-CM | POA: Diagnosis not present

## 2019-02-23 DIAGNOSIS — D631 Anemia in chronic kidney disease: Secondary | ICD-10-CM | POA: Diagnosis not present

## 2019-02-23 DIAGNOSIS — Z992 Dependence on renal dialysis: Secondary | ICD-10-CM | POA: Diagnosis not present

## 2019-02-23 DIAGNOSIS — N186 End stage renal disease: Secondary | ICD-10-CM | POA: Diagnosis not present

## 2019-02-23 DIAGNOSIS — D509 Iron deficiency anemia, unspecified: Secondary | ICD-10-CM | POA: Diagnosis not present

## 2019-02-24 DIAGNOSIS — Z992 Dependence on renal dialysis: Secondary | ICD-10-CM | POA: Diagnosis not present

## 2019-02-24 DIAGNOSIS — N186 End stage renal disease: Secondary | ICD-10-CM | POA: Diagnosis not present

## 2019-02-25 DIAGNOSIS — Z992 Dependence on renal dialysis: Secondary | ICD-10-CM | POA: Diagnosis not present

## 2019-02-25 DIAGNOSIS — N2581 Secondary hyperparathyroidism of renal origin: Secondary | ICD-10-CM | POA: Diagnosis not present

## 2019-02-25 DIAGNOSIS — D509 Iron deficiency anemia, unspecified: Secondary | ICD-10-CM | POA: Diagnosis not present

## 2019-02-25 DIAGNOSIS — F70 Mild intellectual disabilities: Secondary | ICD-10-CM | POA: Diagnosis not present

## 2019-02-25 DIAGNOSIS — N186 End stage renal disease: Secondary | ICD-10-CM | POA: Diagnosis not present

## 2019-02-25 DIAGNOSIS — F4322 Adjustment disorder with anxiety: Secondary | ICD-10-CM | POA: Diagnosis not present

## 2019-02-28 DIAGNOSIS — N186 End stage renal disease: Secondary | ICD-10-CM | POA: Diagnosis not present

## 2019-02-28 DIAGNOSIS — N2581 Secondary hyperparathyroidism of renal origin: Secondary | ICD-10-CM | POA: Diagnosis not present

## 2019-02-28 DIAGNOSIS — Z992 Dependence on renal dialysis: Secondary | ICD-10-CM | POA: Diagnosis not present

## 2019-02-28 DIAGNOSIS — D509 Iron deficiency anemia, unspecified: Secondary | ICD-10-CM | POA: Diagnosis not present

## 2019-03-02 DIAGNOSIS — Z992 Dependence on renal dialysis: Secondary | ICD-10-CM | POA: Diagnosis not present

## 2019-03-02 DIAGNOSIS — N2581 Secondary hyperparathyroidism of renal origin: Secondary | ICD-10-CM | POA: Diagnosis not present

## 2019-03-02 DIAGNOSIS — D509 Iron deficiency anemia, unspecified: Secondary | ICD-10-CM | POA: Diagnosis not present

## 2019-03-02 DIAGNOSIS — N186 End stage renal disease: Secondary | ICD-10-CM | POA: Diagnosis not present

## 2019-03-04 DIAGNOSIS — Z992 Dependence on renal dialysis: Secondary | ICD-10-CM | POA: Diagnosis not present

## 2019-03-04 DIAGNOSIS — D509 Iron deficiency anemia, unspecified: Secondary | ICD-10-CM | POA: Diagnosis not present

## 2019-03-04 DIAGNOSIS — N186 End stage renal disease: Secondary | ICD-10-CM | POA: Diagnosis not present

## 2019-03-04 DIAGNOSIS — N2581 Secondary hyperparathyroidism of renal origin: Secondary | ICD-10-CM | POA: Diagnosis not present

## 2019-03-07 DIAGNOSIS — N186 End stage renal disease: Secondary | ICD-10-CM | POA: Diagnosis not present

## 2019-03-07 DIAGNOSIS — Z992 Dependence on renal dialysis: Secondary | ICD-10-CM | POA: Diagnosis not present

## 2019-03-07 DIAGNOSIS — D509 Iron deficiency anemia, unspecified: Secondary | ICD-10-CM | POA: Diagnosis not present

## 2019-03-07 DIAGNOSIS — N2581 Secondary hyperparathyroidism of renal origin: Secondary | ICD-10-CM | POA: Diagnosis not present

## 2019-03-09 DIAGNOSIS — D509 Iron deficiency anemia, unspecified: Secondary | ICD-10-CM | POA: Diagnosis not present

## 2019-03-09 DIAGNOSIS — N2581 Secondary hyperparathyroidism of renal origin: Secondary | ICD-10-CM | POA: Diagnosis not present

## 2019-03-09 DIAGNOSIS — Z992 Dependence on renal dialysis: Secondary | ICD-10-CM | POA: Diagnosis not present

## 2019-03-09 DIAGNOSIS — N186 End stage renal disease: Secondary | ICD-10-CM | POA: Diagnosis not present

## 2019-03-11 DIAGNOSIS — N186 End stage renal disease: Secondary | ICD-10-CM | POA: Diagnosis not present

## 2019-03-11 DIAGNOSIS — Z992 Dependence on renal dialysis: Secondary | ICD-10-CM | POA: Diagnosis not present

## 2019-03-11 DIAGNOSIS — D509 Iron deficiency anemia, unspecified: Secondary | ICD-10-CM | POA: Diagnosis not present

## 2019-03-11 DIAGNOSIS — N2581 Secondary hyperparathyroidism of renal origin: Secondary | ICD-10-CM | POA: Diagnosis not present

## 2019-03-14 DIAGNOSIS — Z992 Dependence on renal dialysis: Secondary | ICD-10-CM | POA: Diagnosis not present

## 2019-03-14 DIAGNOSIS — N186 End stage renal disease: Secondary | ICD-10-CM | POA: Diagnosis not present

## 2019-03-14 DIAGNOSIS — D509 Iron deficiency anemia, unspecified: Secondary | ICD-10-CM | POA: Diagnosis not present

## 2019-03-14 DIAGNOSIS — N2581 Secondary hyperparathyroidism of renal origin: Secondary | ICD-10-CM | POA: Diagnosis not present

## 2019-03-15 ENCOUNTER — Non-Acute Institutional Stay (SKILLED_NURSING_FACILITY): Payer: Medicare Other | Admitting: Adult Health

## 2019-03-15 ENCOUNTER — Encounter: Payer: Self-pay | Admitting: Adult Health

## 2019-03-15 DIAGNOSIS — E785 Hyperlipidemia, unspecified: Secondary | ICD-10-CM

## 2019-03-15 DIAGNOSIS — Z992 Dependence on renal dialysis: Secondary | ICD-10-CM | POA: Diagnosis not present

## 2019-03-15 DIAGNOSIS — E1169 Type 2 diabetes mellitus with other specified complication: Secondary | ICD-10-CM | POA: Diagnosis not present

## 2019-03-15 DIAGNOSIS — E1122 Type 2 diabetes mellitus with diabetic chronic kidney disease: Secondary | ICD-10-CM | POA: Diagnosis not present

## 2019-03-15 DIAGNOSIS — N186 End stage renal disease: Secondary | ICD-10-CM

## 2019-03-15 DIAGNOSIS — E038 Other specified hypothyroidism: Secondary | ICD-10-CM

## 2019-03-15 NOTE — Progress Notes (Signed)
Location:   [place] Room Number: 112 D Place of Service:  SNF (31)   CODE STATUS: Full Code  No Known Allergies  Chief Complaint  Patient presents with  . Medical Management of Chronic Issues  Dyslipidemia associated with type 2 diabetes mellitus; end stage renal disease on dialysis due to type 2 diabetes mellitus; other specified hypothyroidism.     HPI:  He is a 73 year old long term resident of this facility being seen for the management of his chronic illnesses: dyslipidemia; ESRD; hypothyroidism. He denies any excessive fatigue. He denies any uncontrolled pain; and no insomnia. There are no reports of fevers present.   Past Medical History:  Diagnosis Date  . Abnormal CT scan, kidney 10/06/2011  . Acute pyelonephritis 10/07/2011  . Anemia    normocytic  . Anxiety    mental retardation  . Bladder wall thickening 10/06/2011  . BPH (benign prostatic hypertrophy)   . Diabetes mellitus   . Dialysis patient [facility])    Tuesday, Thursday and Saturday,   . Edema     history of lower extremity edema  . GERD (gastroesophageal reflux disease)   . Heme positive stool   . Hydronephrosis   . Hyperkalemia   . Hyperlipidemia   . Hypernatremia   . Hypertension   . Hypothyroidism   . Impaired speech   . 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 MR (mental retardation)   . Muscle weakness   . Obstructive uropathy   . 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 Poor historian poor historian  . Protein calorie malnutrition ([place])   . Pyelonephritis   . Renal failure (ARF), acute on chronic (HCC)   . Renal insufficiency    chronic history  . Sepsis ([place])   . Smoking   . Uremia   . Urinary retention   . UTI (lower urinary tract infection) 10/06/2011    Past Surgical History:  Procedure Laterality Date  . A/V FISTULAGRAM N/A 08/13/2018   Procedure: A/V FISTULAGRAM - Right Upper;  Surgeon: Elam Dutch, MD;  Location: [place] [facility];   Service: Cardiovascular;  Laterality: N/A;  . A/V FISTULAGRAM N/A 11/22/2018   Procedure: A/V FISTULAGRAM - Right Upper;  Surgeon: Waynetta Sandy, MD;  Location: [place] [facility];  Service: Cardiovascular;  Laterality: N/A;  . AV FISTULA PLACEMENT Left 07/06/2015   Procedure:  INSERTION LEFT ARM ARTERIOVENOUS GORTEX GRAFT;  Surgeon: Angelia Mould, MD;  Location: [place];  Service: Vascular;  Laterality: Left;  . AV FISTULA PLACEMENT Right 02/26/2016   Procedure: ARTERIOVENOUS (AV) FISTULA CREATION ;  Surgeon: Angelia Mould, MD;  Location: [place];  Service: Vascular;  Laterality: Right;  . AV FISTULA PLACEMENT Right 11/25/2018   Procedure: INSERTION OF ARTERIOVENOUS (AV) ARTEGRAFT RIGHT UPPER ARM;  Surgeon: Waynetta Sandy, MD;  Location: [place];  Service: Vascular;  Laterality: Right;  . [place] REMOVAL Left 10/09/2015   Procedure: REMOVAL OF ARTERIOVENOUS GORETEX GRAFT ([place]) Evacuation of Lymphocele, Vein Patch angioplasty of brachial artery.;  Surgeon: Angelia Mould, MD;  Location: [place];  Service: Vascular;  Laterality: Left;  . BASCILIC VEIN TRANSPOSITION Right 02/26/2016   Procedure: Right BASCILIC VEIN TRANSPOSITION;  Surgeon: Angelia Mould, MD;  Location: [place];  Service: Vascular;  Laterality: Right;  . CIRCUMCISION N/A 01/04/2014   Procedure: CIRCUMCISION ADULT (procedure #1);  Surgeon: Marissa Nestle, MD;  Location[facility];  Service: Urology;  Laterality: N/A;  .  COLECTOMY N/A 05/04/2017   Procedure: TOTAL COLECTOMY;  Surgeon: Aviva Signs, MD;  Location: [facility];  Service: General;  Laterality: N/A;  . COLONOSCOPY N/A 04/27/2017   Procedure: COLONOSCOPY;  Surgeon: Daneil Dolin, MD;  [facility];  Service: Endoscopy;  Laterality: N/A;  245  . CYSTOSCOPY W/ RETROGRADES Bilateral 06/29/2015   Procedure: CYSTOSCOPY, DILATION OF URETHRAL STRICTURE WITH BILATERAL RETROGRADE PYELOGRAM,SUPRAPUBIC TUBE CHANGE;  Surgeon: Festus Aloe, MD;   Location: [facility];  Service: Urology;  Laterality: Bilateral;  . CYSTOSCOPY WITH URETHRAL DILATATION N/A 12/29/2013   Procedure: CYSTOSCOPY WITH URETHRAL DILATATION;  Surgeon: Marissa Nestle, MD;  [facility];  Service: Urology;  Laterality: N/A;  . ESOPHAGOGASTRODUODENOSCOPY N/A 04/27/2017   Procedure: ESOPHAGOGASTRODUODENOSCOPY (EGD);  Surgeon: Daneil Dolin, MD;  [facility];  Service: Endoscopy;  Laterality: N/A;  . INSERTION OF DIALYSIS CATHETER Right 11/25/2018   Procedure: INSERTION OF DIALYSIS CATHETER RIGHT INTERNAL JUGULAR;  Surgeon: Waynetta Sandy, MD;  Location: [place];  Service: Vascular;  Laterality: Right;  . IR AV DIALY SHUNT INTRO [place] W/PTA/IMG RIGHT Right 09/07/2018  . IR REMOVAL TUN CV CATH W/O FL  01/12/2019  . IR THROMBECTOMY AV FISTULA W/THROMBOLYSIS/PTA INC/SHUNT/IMG RIGHT Right 04/26/2018  . IR US GUIDE VASC ACCESS RIGHT  04/26/2018  . IR US GUIDE VASC ACCESS RIGHT  09/07/2018  . ORIF FEMUR FRACTURE Right 11/22/2016   Procedure: OPEN REDUCTION INTERNAL FIXATION (ORIF) DISTAL FEMUR FRACTURE;  Surgeon: Rod Can, MD;  Location: [place];  Service: Orthopedics;  Laterality: Right;  . PATCH ANGIOPLASTY Right 12/10/2017   Procedure: PATCH ANGIOPLASTY;  Surgeon: Angelia Mould, MD;  Location: [facility];  Service: Vascular;  Laterality: Right;  . PERIPHERAL VASCULAR BALLOON ANGIOPLASTY  08/13/2018   Procedure: PERIPHERAL VASCULAR BALLOON ANGIOPLASTY;  Surgeon: Elam Dutch, MD;  Location: [place] [facility];  Service: Cardiovascular;;  right AV fistula   . PERIPHERAL VASCULAR BALLOON ANGIOPLASTY  11/22/2018   Procedure: PERIPHERAL VASCULAR BALLOON ANGIOPLASTY;  Surgeon: Waynetta Sandy, MD;  Location: [place] [facility];  Service: Cardiovascular;;  rt AV fistula  . PERIPHERAL VASCULAR CATHETERIZATION N/A 10/08/2015   Procedure: A/V Shuntogram;  Surgeon: Angelia Mould, MD;  Location: [place] [facility];   Service: Cardiovascular;  Laterality: N/A;  . THROMBECTOMY W/ EMBOLECTOMY Right 12/10/2017   Procedure: THROMBECTOMY REVISION RIGHT ARM  ARTERIOVENOUS FISTULA;  Surgeon: Angelia Mould, MD;  Location: [place];  Service: Vascular;  Laterality: Right;  . TRANSURETHRAL RESECTION OF PROSTATE N/A 01/04/2014   Procedure: TRANSURETHRAL RESECTION OF THE PROSTATE (TURP) (procedure #2);  Surgeon: Marissa Nestle, MD;  [facility];  Service: Urology;  Laterality: N/A;    Social History   Socioeconomic History  . Marital status: Single    Spouse name: Not on file  . Number of children: Not on file  . Years of education: Not on file  . Highest education level: Not on file  Occupational History  . Not on file  Social Needs  . Financial resource strain: Not hard at all  . Food insecurity:    Worry: Never true    Inability: Never true  . Transportation needs:    Medical: No    Non-medical: No  Tobacco Use  . Smoking status: Never Smoker  . Smokeless tobacco: Never Used  Substance and Sexual Activity  . Alcohol use: No  . Drug use: No  . Sexual activity: Never  Lifestyle  . Physical activity:  Days per week: 0 days    Minutes per session: Not on file  . Stress: Not at all  Relationships  . Social connections:    Talks on phone: More than three times a week    Gets together: More than three times a week    Attends religious service: Never    Active member of club or organization: No    Attends meetings of clubs or organizations: Never    Relationship status: Never married  . Intimate partner violence:    Fear of current or ex partner: No    Emotionally abused: No    Physically abused: No    Forced sexual activity: No  Other Topics Concern  . Not on file  Social History Narrative   Lives at nursing home.   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   Family History  Problem Relation Age of Onset  . Cancer Mother   . Colon cancer  Neg Hx       VITAL SIGNS BP 129/70   Pulse 72   Temp 98.6 F (37 C)   Resp 19   Ht 5\' 8"  (1.727 m)   Wt 213 lb 6.4 oz (96.8 kg)   BMI 32.45 kg/m   Outpatient Encounter Medications as of 03/15/2019  Medication Sig  . atorvastatin (LIPITOR) 80 MG tablet Take 80 mg by mouth every evening.  . insulin aspart (NOVOLOG FLEXPEN) 100 UNIT/ML FlexPen Inject as Per sliding scale before meals:  < 60, call MD 200 - 300 = 8 units 301 - 400 = 10 units  . Insulin Glargine (BASAGLAR KWIKPEN) 100 UNIT/ML SOPN Inject 24 Units into the skin at bedtime.   Marland Kitchen JANUVIA 25 MG tablet Take 25 mg by mouth daily.   Marland Kitchen levothyroxine (SYNTHROID, LEVOTHROID) 88 MCG tablet Take 88 mcg by mouth daily before breakfast.  . midodrine (PROAMATINE) 5 MG tablet Take 5 mg by mouth every Monday, Wednesday, and Friday with hemodialysis. Send med with resident to dialysis.  Do not give at [facility], give to resident to take with hime  . Multiple Vitamin (MULTIVITAMIN WITH MINERALS) TABS tablet Take 1 tablet by mouth every evening.   . NON FORMULARY Fluid Restriction - 1200 cc a day 500 cc for days, Evenings 500 cc, Night 200 cc  . NON FORMULARY Diet Type:  NAS, Cons CHO Diet  . Nutritional Supplements (NEPRO/CARBSTEADY PO) Take 1 Can by mouth 2 (two) times daily.  Marland Kitchen omega-3 acid ethyl esters (LOVAZA) 1 g capsule Give 4 capsules by mouth once daily  . omeprazole (PRILOSEC) 40 MG capsule Take 40 mg by mouth at bedtime.   . polyethylene glycol (MIRALAX / GLYCOLAX) packet Take 17 g by mouth as needed for mild constipation.   . sevelamer carbonate (RENVELA) 800 MG tablet Take 800-1,600 mg by mouth See admin instructions. Take 1600 mg by mouth 3 times daily with meals and take 800 mg by mouth with snacks  . tamsulosin (FLOMAX) 0.4 MG CAPS capsule Take 0.4 mg by mouth every evening. Give 30 minutes after a meal  . torsemide (DEMADEX) 10 MG tablet Take 5 tablets (50 mg total) by mouth daily.   No facility-administered encounter  medications on file as of 03/15/2019.      SIGNIFICANT DIAGNOSTIC EXAMS  LABS REVIEWED PREVIOUS;  12-06-18: tsh 2.752 01-25-19: wbc 5.6; hgb 12.9 hct 40.8; mcv 110.3; plt 146; glucose 334; bun 25; creat 6.78; k+ 4.7; na++ 137; ca 9.0 liver normal albumin 4.0  Hgb a1c 6.9; chol 94 trig 439 LDL direct 16.3  NO NEW LABS.   Review of Systems  Constitutional: Negative for malaise/fatigue.  Respiratory: Negative for cough and shortness of breath.   Cardiovascular: Negative for chest pain, palpitations and leg swelling.  Gastrointestinal: Negative for abdominal pain, constipation and heartburn.  Musculoskeletal: Negative for back pain, joint pain and myalgias.  Skin: Negative.   Neurological: Negative for dizziness.  Psychiatric/Behavioral: The patient is not nervous/anxious.     Physical Exam Constitutional:      General: He is not in acute distress.    Appearance: He is well-developed. He is obese. He is not diaphoretic.  Neck:     Musculoskeletal: Neck supple.     Thyroid: No thyromegaly.  Cardiovascular:     Rate and Rhythm: Normal rate and regular rhythm.     Pulses: Normal pulses.     Heart sounds: Normal heart sounds.  Pulmonary:     Effort: Pulmonary effort is normal. No respiratory distress.     Breath sounds: Normal breath sounds.  Abdominal:     General: Bowel sounds are normal. There is no distension.     Palpations: Abdomen is soft.     Tenderness: There is no abdominal tenderness.     Comments: History of colectomy   Genitourinary:    Comments: History of TURP Musculoskeletal:     Right lower leg: No edema.     Left lower leg: No edema.     Comments: Is able to move all extremities History of right femur ORIF  Lymphadenopathy:     Cervical: No cervical adenopathy.  Skin:    General: Skin is warm and dry.     Comments: Right upper extremity a/v fistula: + thrill + bruit     Neurological:     Mental Status: He is alert. Mental status is at baseline.   Psychiatric:        Mood and Affect: Mood normal.     ASSESSMENT/ PLAN:  TODAY   1. Dyslipidemia associated with type 2 diabetes mellitus: is stable trig 439; will continue lipitor 80 mg daily and lovaza 4 gm daily   2. End stage renal disease on hemodialysis due to type 2 diabetes mellitus: is stable; will continue dialysis three times weekly 1200 cc fluid restriction; renvela 1600 mg with meals and 800 mg with snacks.   3. Other specified hypothyroidism: is stable tsh 2.752; will continue synthroid 88 mcg daily   PREVIOUS   4. 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 Bilateral lower extremity edema; is stable will continue demadex 50 mg daily   6. Orthostatic hypotension: is stable b/p 129; will continue midodine 5 mg with dialysis  7. GERD without esophagitis: is stable will continue prilosec 40 mg daily  8. Uncontrolled type 2 diabetes mellitus with end stage renal disease: stable hgb a1c 6.9; will continue basaglar 24 units nightly Novolog SSI; 200-300: 8 units; 301-400: 10 units; januvia 25 mg daily   MD is aware of resident's narcotic use and is in agreement with current plan of care. We will attempt to wean resident as apropriate   Ok Edwards NP [facility]  Contact [phone] Monday through Friday 8am- 5pm  After hours call [phone]

## 2019-03-16 DIAGNOSIS — N2581 Secondary hyperparathyroidism of renal origin: Secondary | ICD-10-CM | POA: Diagnosis not present

## 2019-03-16 DIAGNOSIS — D509 Iron deficiency anemia, unspecified: Secondary | ICD-10-CM | POA: Diagnosis not present

## 2019-03-16 DIAGNOSIS — Z992 Dependence on renal dialysis: Secondary | ICD-10-CM | POA: Diagnosis not present

## 2019-03-16 DIAGNOSIS — N186 End stage renal disease: Secondary | ICD-10-CM | POA: Diagnosis not present

## 2019-03-18 DIAGNOSIS — N2581 Secondary hyperparathyroidism of renal origin: Secondary | ICD-10-CM | POA: Diagnosis not present

## 2019-03-18 DIAGNOSIS — D509 Iron deficiency anemia, unspecified: Secondary | ICD-10-CM | POA: Diagnosis not present

## 2019-03-18 DIAGNOSIS — N186 End stage renal disease: Secondary | ICD-10-CM | POA: Diagnosis not present

## 2019-03-18 DIAGNOSIS — Z992 Dependence on renal dialysis: Secondary | ICD-10-CM | POA: Diagnosis not present

## 2019-03-21 DIAGNOSIS — Z992 Dependence on renal dialysis: Secondary | ICD-10-CM | POA: Diagnosis not present

## 2019-03-21 DIAGNOSIS — D509 Iron deficiency anemia, unspecified: Secondary | ICD-10-CM | POA: Diagnosis not present

## 2019-03-21 DIAGNOSIS — N186 End stage renal disease: Secondary | ICD-10-CM | POA: Diagnosis not present

## 2019-03-21 DIAGNOSIS — N2581 Secondary hyperparathyroidism of renal origin: Secondary | ICD-10-CM | POA: Diagnosis not present

## 2019-03-23 ENCOUNTER — Other Ambulatory Visit (HOSPITAL_COMMUNITY)
Admission: AD | Admit: 2019-03-23 | Discharge: 2019-03-23 | Disposition: A | Discharge status: Home / Self Care | Payer: Medicare Other | Source: Skilled Nursing Facility | Attending: Internal Medicine | Admitting: Internal Medicine

## 2019-03-23 DIAGNOSIS — D509 Iron deficiency anemia, unspecified: Secondary | ICD-10-CM | POA: Diagnosis not present

## 2019-03-23 DIAGNOSIS — N2581 Secondary hyperparathyroidism of renal origin: Secondary | ICD-10-CM | POA: Diagnosis not present

## 2019-03-23 DIAGNOSIS — Z992 Dependence on renal dialysis: Secondary | ICD-10-CM | POA: Diagnosis not present

## 2019-03-23 DIAGNOSIS — R05 Cough: Secondary | ICD-10-CM | POA: Diagnosis not present

## 2019-03-23 DIAGNOSIS — Z20828 Contact with and (suspected) exposure to other viral communicable diseases: Secondary | ICD-10-CM | POA: Diagnosis not present

## 2019-03-23 DIAGNOSIS — N186 End stage renal disease: Secondary | ICD-10-CM | POA: Diagnosis not present

## 2019-03-23 LAB — SARS CORONAVIRUS 2 BY RT PCR (HOSPITAL ORDER, PERFORMED IN ~~LOC~~ HOSPITAL LAB): SARS Coronavirus 2: NEGATIVE

## 2019-03-25 DIAGNOSIS — N2581 Secondary hyperparathyroidism of renal origin: Secondary | ICD-10-CM | POA: Diagnosis not present

## 2019-03-25 DIAGNOSIS — D509 Iron deficiency anemia, unspecified: Secondary | ICD-10-CM | POA: Diagnosis not present

## 2019-03-25 DIAGNOSIS — N186 End stage renal disease: Secondary | ICD-10-CM | POA: Diagnosis not present

## 2019-03-25 DIAGNOSIS — Z992 Dependence on renal dialysis: Secondary | ICD-10-CM | POA: Diagnosis not present

## 2019-03-27 DIAGNOSIS — N186 End stage renal disease: Secondary | ICD-10-CM | POA: Diagnosis not present

## 2019-03-27 DIAGNOSIS — Z992 Dependence on renal dialysis: Secondary | ICD-10-CM | POA: Diagnosis not present

## 2019-03-28 DIAGNOSIS — N2581 Secondary hyperparathyroidism of renal origin: Secondary | ICD-10-CM | POA: Diagnosis not present

## 2019-03-28 DIAGNOSIS — D509 Iron deficiency anemia, unspecified: Secondary | ICD-10-CM | POA: Diagnosis not present

## 2019-03-28 DIAGNOSIS — D631 Anemia in chronic kidney disease: Secondary | ICD-10-CM | POA: Diagnosis not present

## 2019-03-28 DIAGNOSIS — N186 End stage renal disease: Secondary | ICD-10-CM | POA: Diagnosis not present

## 2019-03-28 DIAGNOSIS — Z992 Dependence on renal dialysis: Secondary | ICD-10-CM | POA: Diagnosis not present

## 2019-03-30 DIAGNOSIS — N2581 Secondary hyperparathyroidism of renal origin: Secondary | ICD-10-CM | POA: Diagnosis not present

## 2019-03-30 DIAGNOSIS — N186 End stage renal disease: Secondary | ICD-10-CM | POA: Diagnosis not present

## 2019-03-30 DIAGNOSIS — D631 Anemia in chronic kidney disease: Secondary | ICD-10-CM | POA: Diagnosis not present

## 2019-03-30 DIAGNOSIS — D509 Iron deficiency anemia, unspecified: Secondary | ICD-10-CM | POA: Diagnosis not present

## 2019-03-30 DIAGNOSIS — Z992 Dependence on renal dialysis: Secondary | ICD-10-CM | POA: Diagnosis not present

## 2019-04-01 DIAGNOSIS — Z992 Dependence on renal dialysis: Secondary | ICD-10-CM | POA: Diagnosis not present

## 2019-04-01 DIAGNOSIS — D631 Anemia in chronic kidney disease: Secondary | ICD-10-CM | POA: Diagnosis not present

## 2019-04-01 DIAGNOSIS — F70 Mild intellectual disabilities: Secondary | ICD-10-CM | POA: Diagnosis not present

## 2019-04-01 DIAGNOSIS — D509 Iron deficiency anemia, unspecified: Secondary | ICD-10-CM | POA: Diagnosis not present

## 2019-04-01 DIAGNOSIS — F4322 Adjustment disorder with anxiety: Secondary | ICD-10-CM | POA: Diagnosis not present

## 2019-04-01 DIAGNOSIS — N186 End stage renal disease: Secondary | ICD-10-CM | POA: Diagnosis not present

## 2019-04-01 DIAGNOSIS — N2581 Secondary hyperparathyroidism of renal origin: Secondary | ICD-10-CM | POA: Diagnosis not present

## 2019-04-04 DIAGNOSIS — D509 Iron deficiency anemia, unspecified: Secondary | ICD-10-CM | POA: Diagnosis not present

## 2019-04-04 DIAGNOSIS — D631 Anemia in chronic kidney disease: Secondary | ICD-10-CM | POA: Diagnosis not present

## 2019-04-04 DIAGNOSIS — N2581 Secondary hyperparathyroidism of renal origin: Secondary | ICD-10-CM | POA: Diagnosis not present

## 2019-04-04 DIAGNOSIS — Z992 Dependence on renal dialysis: Secondary | ICD-10-CM | POA: Diagnosis not present

## 2019-04-04 DIAGNOSIS — N186 End stage renal disease: Secondary | ICD-10-CM | POA: Diagnosis not present

## 2019-04-05 ENCOUNTER — Non-Acute Institutional Stay (SKILLED_NURSING_FACILITY): Payer: Medicare Other | Admitting: Adult Health

## 2019-04-05 ENCOUNTER — Encounter: Payer: Self-pay | Admitting: Adult Health

## 2019-04-05 DIAGNOSIS — E039 Hypothyroidism, unspecified: Secondary | ICD-10-CM | POA: Diagnosis not present

## 2019-04-05 DIAGNOSIS — Z9181 History of falling: Secondary | ICD-10-CM | POA: Diagnosis not present

## 2019-04-05 DIAGNOSIS — N186 End stage renal disease: Secondary | ICD-10-CM

## 2019-04-05 DIAGNOSIS — E785 Hyperlipidemia, unspecified: Secondary | ICD-10-CM | POA: Diagnosis not present

## 2019-04-05 DIAGNOSIS — K219 Gastro-esophageal reflux disease without esophagitis: Secondary | ICD-10-CM | POA: Diagnosis not present

## 2019-04-05 DIAGNOSIS — K5900 Constipation, unspecified: Secondary | ICD-10-CM | POA: Diagnosis not present

## 2019-04-05 DIAGNOSIS — R600 Localized edema: Secondary | ICD-10-CM

## 2019-04-05 DIAGNOSIS — I951 Orthostatic hypotension: Secondary | ICD-10-CM

## 2019-04-05 DIAGNOSIS — F70 Mild intellectual disabilities: Secondary | ICD-10-CM | POA: Diagnosis not present

## 2019-04-05 DIAGNOSIS — Z992 Dependence on renal dialysis: Secondary | ICD-10-CM

## 2019-04-05 DIAGNOSIS — I120 Hypertensive chronic kidney disease with stage 5 chronic kidney disease or end stage renal disease: Secondary | ICD-10-CM | POA: Diagnosis not present

## 2019-04-05 DIAGNOSIS — R262 Difficulty in walking, not elsewhere classified: Secondary | ICD-10-CM | POA: Diagnosis not present

## 2019-04-05 DIAGNOSIS — D500 Iron deficiency anemia secondary to blood loss (chronic): Secondary | ICD-10-CM | POA: Diagnosis not present

## 2019-04-05 DIAGNOSIS — R279 Unspecified lack of coordination: Secondary | ICD-10-CM | POA: Diagnosis not present

## 2019-04-05 DIAGNOSIS — E1129 Type 2 diabetes mellitus with other diabetic kidney complication: Secondary | ICD-10-CM | POA: Diagnosis not present

## 2019-04-05 DIAGNOSIS — D631 Anemia in chronic kidney disease: Secondary | ICD-10-CM

## 2019-04-05 NOTE — Progress Notes (Signed)
Location:   [place] Room Number: 112 D Place of Service:  SNF (31)   CODE STATUS: Full Code  No Known Allergies  Chief Complaint  Patient presents with  . 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 He is doing well; he denies any pain; he states he has a good appetite. He denies any insomnia. There are no reports of fevers present.   Past Medical History:  Diagnosis Date  . Abnormal CT scan, kidney 10/06/2011  . Acute pyelonephritis 10/07/2011  . Anemia    normocytic  . Anxiety    mental retardation  . Bladder wall thickening 10/06/2011  . BPH (benign prostatic hypertrophy)   . Diabetes mellitus   . Dialysis patient [facility])    Tuesday, Thursday and Saturday,   . Edema     history of lower extremity edema  . GERD (gastroesophageal reflux disease)   . Heme positive stool   . Hydronephrosis   . Hyperkalemia   . Hyperlipidemia   . Hypernatremia   . Hypertension   . Hypothyroidism   . Impaired speech   . 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 MR (mental retardation)   . Muscle weakness   . Obstructive uropathy   . 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 Poor historian poor historian  . Protein calorie malnutrition ([place])   . Pyelonephritis   . Renal failure (ARF), acute on chronic (HCC)   . Renal insufficiency    chronic history  . Sepsis ([place])   . Smoking   . Uremia   . Urinary retention   . UTI (lower urinary tract infection) 10/06/2011    Past Surgical History:  Procedure Laterality Date  . A/V FISTULAGRAM N/A 08/13/2018   Procedure: A/V FISTULAGRAM - Right Upper;  Surgeon: Elam Dutch, MD;  Location: [place] CV [facility];  Service: Cardiovascular;  Laterality: N/A;  . A/V  FISTULAGRAM N/A 11/22/2018   Procedure: A/V FISTULAGRAM - Right Upper;  Surgeon: Waynetta Sandy, MD;  Location: [place] [facility];  Service: Cardiovascular;  Laterality: N/A;  . AV FISTULA PLACEMENT Left 07/06/2015   Procedure:  INSERTION LEFT ARM ARTERIOVENOUS GORTEX GRAFT;  Surgeon: Angelia Mould, MD;  Location: [place];  Service: Vascular;  Laterality: Left;  . AV FISTULA PLACEMENT Right 02/26/2016   Procedure: ARTERIOVENOUS (AV) FISTULA CREATION ;  Surgeon: Angelia Mould, MD;  Location: [place];  Service: Vascular;  Laterality: Right;  . AV FISTULA PLACEMENT Right 11/25/2018   Procedure: INSERTION OF ARTERIOVENOUS (AV) ARTEGRAFT RIGHT UPPER ARM;  Surgeon: Waynetta Sandy, MD;  Location: [place];  Service: Vascular;  Laterality: Right;  . [place] REMOVAL Left 10/09/2015   Procedure: REMOVAL OF ARTERIOVENOUS GORETEX GRAFT ([place]) Evacuation of Lymphocele, Vein Patch angioplasty of brachial artery.;  Surgeon: Angelia Mould, MD;  Location: [place];  Service: Vascular;  Laterality: Left;  . BASCILIC VEIN TRANSPOSITION Right 02/26/2016   Procedure: Right BASCILIC VEIN TRANSPOSITION;  Surgeon: Angelia Mould, MD;  Location: [place];  Service: Vascular;  Laterality: Right;  . CIRCUMCISION N/A 01/04/2014   Procedure: CIRCUMCISION ADULT (procedure #1);  Surgeon: Marissa Nestle, MD;  Location[facility];  Service: Urology;  Laterality: N/A;  . COLECTOMY  N/A 05/04/2017   Procedure: TOTAL COLECTOMY;  Surgeon: Aviva Signs, MD;  Location: [facility];  Service: General;  Laterality: N/A;  . COLONOSCOPY N/A 04/27/2017   Procedure: COLONOSCOPY;  Surgeon: Daneil Dolin, MD;  [facility];  Service: Endoscopy;  Laterality: N/A;  245  . CYSTOSCOPY W/ RETROGRADES Bilateral 06/29/2015   Procedure: CYSTOSCOPY, DILATION OF URETHRAL STRICTURE WITH BILATERAL RETROGRADE PYELOGRAM,SUPRAPUBIC TUBE CHANGE;  Surgeon: Festus Aloe, MD;  Location: [facility];  Service: Urology;  Laterality:  Bilateral;  . CYSTOSCOPY WITH URETHRAL DILATATION N/A 12/29/2013   Procedure: CYSTOSCOPY WITH URETHRAL DILATATION;  Surgeon: Marissa Nestle, MD;  [facility];  Service: Urology;  Laterality: N/A;  . ESOPHAGOGASTRODUODENOSCOPY N/A 04/27/2017   Procedure: ESOPHAGOGASTRODUODENOSCOPY (EGD);  Surgeon: Daneil Dolin, MD;  [facility];  Service: Endoscopy;  Laterality: N/A;  . INSERTION OF DIALYSIS CATHETER Right 11/25/2018   Procedure: INSERTION OF DIALYSIS CATHETER RIGHT INTERNAL JUGULAR;  Surgeon: Waynetta Sandy, MD;  Location: [place];  Service: Vascular;  Laterality: Right;  . IR AV DIALY SHUNT INTRO [place] W/PTA/IMG RIGHT Right 09/07/2018  . IR REMOVAL TUN CV CATH W/O FL  01/12/2019  . IR THROMBECTOMY AV FISTULA W/THROMBOLYSIS/PTA INC/SHUNT/IMG RIGHT Right 04/26/2018  . IR US GUIDE VASC ACCESS RIGHT  04/26/2018  . IR US GUIDE VASC ACCESS RIGHT  09/07/2018  . ORIF FEMUR FRACTURE Right 11/22/2016   Procedure: OPEN REDUCTION INTERNAL FIXATION (ORIF) DISTAL FEMUR FRACTURE;  Surgeon: Rod Can, MD;  Location: [place];  Service: Orthopedics;  Laterality: Right;  . PATCH ANGIOPLASTY Right 12/10/2017   Procedure: PATCH ANGIOPLASTY;  Surgeon: Angelia Mould, MD;  [facility];  Service: Vascular;  Laterality: Right;  . PERIPHERAL VASCULAR BALLOON ANGIOPLASTY  08/13/2018   Procedure: PERIPHERAL VASCULAR BALLOON ANGIOPLASTY;  Surgeon: Elam Dutch, MD;  Location: [place] [facility];  Service: Cardiovascular;;  right AV fistula   . PERIPHERAL VASCULAR BALLOON ANGIOPLASTY  11/22/2018   Procedure: PERIPHERAL VASCULAR BALLOON ANGIOPLASTY;  Surgeon: Waynetta Sandy, MD;  Location: [place] [facility];  Service: Cardiovascular;;  rt AV fistula  . PERIPHERAL VASCULAR CATHETERIZATION N/A 10/08/2015   Procedure: A/V Shuntogram;  Surgeon: Angelia Mould, MD;  Location: [place] [facility];  Service: Cardiovascular;  Laterality: N/A;  .  THROMBECTOMY W/ EMBOLECTOMY Right 12/10/2017   Procedure: THROMBECTOMY REVISION RIGHT ARM  ARTERIOVENOUS FISTULA;  Surgeon: Angelia Mould, MD;  Location: [place];  Service: Vascular;  Laterality: Right;  . TRANSURETHRAL RESECTION OF PROSTATE N/A 01/04/2014   Procedure: TRANSURETHRAL RESECTION OF THE PROSTATE (TURP) (procedure #2);  Surgeon: Marissa Nestle, MD;  [facility];  Service: Urology;  Laterality: N/A;    Social History   Socioeconomic History  . Marital status: Single    Spouse name: Not on file  . Number of children: Not on file  . Years of education: Not on file  . Highest education level: Not on file  Occupational History  . Not on file  Social Needs  . Financial resource strain: Not hard at all  . Food insecurity:    Worry: Never true    Inability: Never true  . Transportation needs:    Medical: No    Non-medical: No  Tobacco Use  . Smoking status: Never Smoker  . Smokeless tobacco: Never Used  Substance and Sexual Activity  . Alcohol use: No  . Drug use: No  . Sexual activity: Never  Lifestyle  . Physical activity:  Days per week: 0 days    Minutes per session: Not on file  . Stress: Not at all  Relationships  . Social connections:    Talks on phone: More than three times a week    Gets together: More than three times a week    Attends religious service: Never    Active member of club or organization: No    Attends meetings of clubs or organizations: Never    Relationship status: Never married  . Intimate partner violence:    Fear of current or ex partner: No    Emotionally abused: No    Physically abused: No    Forced sexual activity: No  Other Topics Concern  . Not on file  Social History Narrative   Lives at nursing home.   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   Family History  Problem Relation Age of Onset  . Cancer Mother   . Colon cancer Neg Hx       VITAL SIGNS BP 129/70   Pulse  72   Temp 97.9 F (36.6 C)   Resp 19   Ht 5\' 8"  (1.727 m)   Wt 212 lb (96.2 kg)   BMI 32.23 kg/m   Outpatient Encounter Medications as of 04/05/2019  Medication Sig  . atorvastatin (LIPITOR) 80 MG tablet Take 80 mg by mouth every evening.  . insulin aspart (NOVOLOG FLEXPEN) 100 UNIT/ML FlexPen Inject as Per sliding scale before meals:  < 60, call MD 200 - 300 = 8 units 301 - 400 = 10 units  . Insulin Glargine (BASAGLAR KWIKPEN) 100 UNIT/ML SOPN Inject 24 Units into the skin at bedtime.   Marland Kitchen JANUVIA 25 MG tablet Take 25 mg by mouth daily.   Marland Kitchen levothyroxine (SYNTHROID, LEVOTHROID) 88 MCG tablet Take 88 mcg by mouth daily before breakfast.  . midodrine (PROAMATINE) 5 MG tablet Take 5 mg by mouth every Monday, Wednesday, and Friday with hemodialysis. Send med with resident to dialysis.  Do not give at [facility], give to resident to take with hime  . Multiple Vitamin (MULTIVITAMIN WITH MINERALS) TABS tablet Take 1 tablet by mouth every evening.   . NON FORMULARY Fluid Restriction - 1200 cc a day 500 cc for days, Evenings 500 cc, Night 200 cc  . NON FORMULARY Diet Type:  NAS, Cons CHO Diet  . Nutritional Supplements (NEPRO/CARBSTEADY PO) Take 1 Can by mouth 2 (two) times daily.  . Omega-3 Fatty Acids (FISH OIL PO) 100-160-1,000 - take one capsule by mouth daily  . omeprazole (PRILOSEC) 40 MG capsule Take 40 mg by mouth at bedtime.   . polyethylene glycol (MIRALAX / GLYCOLAX) packet Take 17 g by mouth as needed for mild constipation.   . sevelamer carbonate (RENVELA) 800 MG tablet Take 800-1,600 mg by mouth See admin instructions. Take 1600 mg by mouth 3 times daily with meals and take 800 mg by mouth with snacks  . tamsulosin (FLOMAX) 0.4 MG CAPS capsule Take 0.4 mg by mouth every evening. Give 30 minutes after a meal  . torsemide (DEMADEX) 10 MG tablet Take 5 tablets (50 mg total) by mouth daily.  . [DISCONTINUED] omega-3 acid ethyl esters (LOVAZA) 1 g capsule Give 4 capsules by mouth once  daily   No facility-administered encounter medications on file as of 04/05/2019.      SIGNIFICANT DIAGNOSTIC EXAMS  LABS REVIEWED PREVIOUS;  12-06-18: tsh 2.752 01-25-19: wbc 5.6; hgb 12.9 hct 40.8; mcv 110.3; plt 146; glucose 334;  bun 25; creat 6.78; k+ 4.7; na++ 137; ca 9.0 liver normal albumin 4.0  Hgb a1c 6.9; chol 94 trig 439 LDL direct 16.3  NO NEW LABS.     Review of Systems  Constitutional: Negative for malaise/fatigue.  Respiratory: Negative for cough and shortness of breath.   Cardiovascular: Negative for chest pain, palpitations and leg swelling.  Gastrointestinal: Negative for abdominal pain, constipation and heartburn.  Musculoskeletal: Negative for back pain, joint pain and myalgias.  Skin: Negative.   Neurological: Negative for dizziness.  Psychiatric/Behavioral: The patient is not nervous/anxious.     Physical Exam Constitutional:      General: He is not in acute distress.    Appearance: He is well-developed. He is obese. He is not diaphoretic.  Neck:     Musculoskeletal: Neck supple.     Thyroid: No thyromegaly.  Cardiovascular:     Rate and Rhythm: Normal rate and regular rhythm.     Pulses: Normal pulses.     Heart sounds: Normal heart sounds.  Pulmonary:     Effort: Pulmonary effort is normal. No respiratory distress.     Breath sounds: Normal breath sounds.  Abdominal:     General: Bowel sounds are normal. There is no distension.     Palpations: Abdomen is soft.     Tenderness: There is no abdominal tenderness.     Comments: History of colectomy   Genitourinary:    Comments: History of turp  Musculoskeletal:     Right lower leg: No edema.     Left lower leg: No edema.     Comments: Is able to move all extremities History of right femur ORIF   Lymphadenopathy:     Cervical: No cervical adenopathy.  Skin:    General: Skin is warm and dry.     Comments: Right upper extremity a/v fistula: + thrill + bruit      Neurological:     Mental Status:  He is alert. Mental status is at baseline.  Psychiatric:        Mood and Affect: Mood normal.     ASSESSMENT/ PLAN:  TODAY   1. 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 Bilateral lower extremity edema; is stable will continue demadex 50 mg daily   3. Orthostatic hypotension: is stable b/p  129/70: will continue midodrine 5 mg prior to dialysis   PREVIOUS   4. GERD without esophagitis: is stable will continue prilosec 40 mg daily  5. 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 Dyslipidemia associated with type 2 diabetes mellitus: is stable trig 439; will continue lipitor 80 mg daily and fish oil daily   7. End stage renal disease on hemodialysis due to type 2 diabetes mellitus: is stable; will continue dialysis three times will continue midodrine 5 mg prior to dialysis  weekly 1200 cc fluid restriction; renvela 1600 mg with meals and 800 mg with snacks.   8. Other specified hypothyroidism: is stable tsh 2.752; will continue synthroid 88 mcg daily      MD is aware of resident's narcotic use and is in agreement with current plan of care. We will attempt to wean resident as apropriate   Ok Edwards NP [facility]  Contact [phone] Monday through Friday 8am- 5pm  After hours call [phone]

## 2019-04-06 DIAGNOSIS — Z992 Dependence on renal dialysis: Secondary | ICD-10-CM | POA: Diagnosis not present

## 2019-04-06 DIAGNOSIS — N2581 Secondary hyperparathyroidism of renal origin: Secondary | ICD-10-CM | POA: Diagnosis not present

## 2019-04-06 DIAGNOSIS — D631 Anemia in chronic kidney disease: Secondary | ICD-10-CM | POA: Diagnosis not present

## 2019-04-06 DIAGNOSIS — N186 End stage renal disease: Secondary | ICD-10-CM | POA: Diagnosis not present

## 2019-04-06 DIAGNOSIS — D509 Iron deficiency anemia, unspecified: Secondary | ICD-10-CM | POA: Diagnosis not present

## 2019-04-08 ENCOUNTER — Encounter: Payer: Self-pay | Admitting: Adult Health

## 2019-04-08 DIAGNOSIS — D631 Anemia in chronic kidney disease: Secondary | ICD-10-CM | POA: Diagnosis not present

## 2019-04-08 DIAGNOSIS — D509 Iron deficiency anemia, unspecified: Secondary | ICD-10-CM | POA: Diagnosis not present

## 2019-04-08 DIAGNOSIS — Z992 Dependence on renal dialysis: Secondary | ICD-10-CM | POA: Diagnosis not present

## 2019-04-08 DIAGNOSIS — N2581 Secondary hyperparathyroidism of renal origin: Secondary | ICD-10-CM | POA: Diagnosis not present

## 2019-04-08 DIAGNOSIS — N186 End stage renal disease: Secondary | ICD-10-CM | POA: Diagnosis not present

## 2019-04-11 DIAGNOSIS — D509 Iron deficiency anemia, unspecified: Secondary | ICD-10-CM | POA: Diagnosis not present

## 2019-04-11 DIAGNOSIS — Z992 Dependence on renal dialysis: Secondary | ICD-10-CM | POA: Diagnosis not present

## 2019-04-11 DIAGNOSIS — D631 Anemia in chronic kidney disease: Secondary | ICD-10-CM | POA: Diagnosis not present

## 2019-04-11 DIAGNOSIS — N186 End stage renal disease: Secondary | ICD-10-CM | POA: Diagnosis not present

## 2019-04-11 DIAGNOSIS — N2581 Secondary hyperparathyroidism of renal origin: Secondary | ICD-10-CM | POA: Diagnosis not present

## 2019-04-13 DIAGNOSIS — N2581 Secondary hyperparathyroidism of renal origin: Secondary | ICD-10-CM | POA: Diagnosis not present

## 2019-04-13 DIAGNOSIS — Z992 Dependence on renal dialysis: Secondary | ICD-10-CM | POA: Diagnosis not present

## 2019-04-13 DIAGNOSIS — N186 End stage renal disease: Secondary | ICD-10-CM | POA: Diagnosis not present

## 2019-04-13 DIAGNOSIS — D631 Anemia in chronic kidney disease: Secondary | ICD-10-CM | POA: Diagnosis not present

## 2019-04-13 DIAGNOSIS — D509 Iron deficiency anemia, unspecified: Secondary | ICD-10-CM | POA: Diagnosis not present

## 2019-04-15 DIAGNOSIS — Z992 Dependence on renal dialysis: Secondary | ICD-10-CM | POA: Diagnosis not present

## 2019-04-15 DIAGNOSIS — D631 Anemia in chronic kidney disease: Secondary | ICD-10-CM | POA: Diagnosis not present

## 2019-04-15 DIAGNOSIS — N2581 Secondary hyperparathyroidism of renal origin: Secondary | ICD-10-CM | POA: Diagnosis not present

## 2019-04-15 DIAGNOSIS — N186 End stage renal disease: Secondary | ICD-10-CM | POA: Diagnosis not present

## 2019-04-15 DIAGNOSIS — D509 Iron deficiency anemia, unspecified: Secondary | ICD-10-CM | POA: Diagnosis not present

## 2019-04-18 DIAGNOSIS — D509 Iron deficiency anemia, unspecified: Secondary | ICD-10-CM | POA: Diagnosis not present

## 2019-04-18 DIAGNOSIS — N2581 Secondary hyperparathyroidism of renal origin: Secondary | ICD-10-CM | POA: Diagnosis not present

## 2019-04-18 DIAGNOSIS — N186 End stage renal disease: Secondary | ICD-10-CM | POA: Diagnosis not present

## 2019-04-18 DIAGNOSIS — D631 Anemia in chronic kidney disease: Secondary | ICD-10-CM | POA: Diagnosis not present

## 2019-04-18 DIAGNOSIS — Z992 Dependence on renal dialysis: Secondary | ICD-10-CM | POA: Diagnosis not present

## 2019-04-20 DIAGNOSIS — D631 Anemia in chronic kidney disease: Secondary | ICD-10-CM | POA: Diagnosis not present

## 2019-04-20 DIAGNOSIS — Z992 Dependence on renal dialysis: Secondary | ICD-10-CM | POA: Diagnosis not present

## 2019-04-20 DIAGNOSIS — N186 End stage renal disease: Secondary | ICD-10-CM | POA: Diagnosis not present

## 2019-04-20 DIAGNOSIS — N2581 Secondary hyperparathyroidism of renal origin: Secondary | ICD-10-CM | POA: Diagnosis not present

## 2019-04-20 DIAGNOSIS — D509 Iron deficiency anemia, unspecified: Secondary | ICD-10-CM | POA: Diagnosis not present

## 2019-04-22 DIAGNOSIS — D631 Anemia in chronic kidney disease: Secondary | ICD-10-CM | POA: Diagnosis not present

## 2019-04-22 DIAGNOSIS — N186 End stage renal disease: Secondary | ICD-10-CM | POA: Diagnosis not present

## 2019-04-22 DIAGNOSIS — D509 Iron deficiency anemia, unspecified: Secondary | ICD-10-CM | POA: Diagnosis not present

## 2019-04-22 DIAGNOSIS — Z992 Dependence on renal dialysis: Secondary | ICD-10-CM | POA: Diagnosis not present

## 2019-04-22 DIAGNOSIS — N2581 Secondary hyperparathyroidism of renal origin: Secondary | ICD-10-CM | POA: Diagnosis not present

## 2019-04-25 DIAGNOSIS — Z992 Dependence on renal dialysis: Secondary | ICD-10-CM | POA: Diagnosis not present

## 2019-04-25 DIAGNOSIS — D631 Anemia in chronic kidney disease: Secondary | ICD-10-CM | POA: Diagnosis not present

## 2019-04-25 DIAGNOSIS — D509 Iron deficiency anemia, unspecified: Secondary | ICD-10-CM | POA: Diagnosis not present

## 2019-04-25 DIAGNOSIS — N2581 Secondary hyperparathyroidism of renal origin: Secondary | ICD-10-CM | POA: Diagnosis not present

## 2019-04-25 DIAGNOSIS — N186 End stage renal disease: Secondary | ICD-10-CM | POA: Diagnosis not present

## 2019-04-26 DIAGNOSIS — N186 End stage renal disease: Secondary | ICD-10-CM | POA: Diagnosis not present

## 2019-04-26 DIAGNOSIS — Z992 Dependence on renal dialysis: Secondary | ICD-10-CM | POA: Diagnosis not present

## 2019-04-27 DIAGNOSIS — N186 End stage renal disease: Secondary | ICD-10-CM | POA: Diagnosis not present

## 2019-04-27 DIAGNOSIS — D631 Anemia in chronic kidney disease: Secondary | ICD-10-CM | POA: Diagnosis not present

## 2019-04-27 DIAGNOSIS — N2581 Secondary hyperparathyroidism of renal origin: Secondary | ICD-10-CM | POA: Diagnosis not present

## 2019-04-27 DIAGNOSIS — Z992 Dependence on renal dialysis: Secondary | ICD-10-CM | POA: Diagnosis not present

## 2019-04-27 DIAGNOSIS — D509 Iron deficiency anemia, unspecified: Secondary | ICD-10-CM | POA: Diagnosis not present

## 2019-04-29 DIAGNOSIS — D509 Iron deficiency anemia, unspecified: Secondary | ICD-10-CM | POA: Diagnosis not present

## 2019-04-29 DIAGNOSIS — D631 Anemia in chronic kidney disease: Secondary | ICD-10-CM | POA: Diagnosis not present

## 2019-04-29 DIAGNOSIS — N186 End stage renal disease: Secondary | ICD-10-CM | POA: Diagnosis not present

## 2019-04-29 DIAGNOSIS — Z992 Dependence on renal dialysis: Secondary | ICD-10-CM | POA: Diagnosis not present

## 2019-04-29 DIAGNOSIS — N2581 Secondary hyperparathyroidism of renal origin: Secondary | ICD-10-CM | POA: Diagnosis not present

## 2019-05-02 DIAGNOSIS — Z992 Dependence on renal dialysis: Secondary | ICD-10-CM | POA: Diagnosis not present

## 2019-05-02 DIAGNOSIS — N186 End stage renal disease: Secondary | ICD-10-CM | POA: Diagnosis not present

## 2019-05-02 DIAGNOSIS — N2581 Secondary hyperparathyroidism of renal origin: Secondary | ICD-10-CM | POA: Diagnosis not present

## 2019-05-02 DIAGNOSIS — D631 Anemia in chronic kidney disease: Secondary | ICD-10-CM | POA: Diagnosis not present

## 2019-05-02 DIAGNOSIS — D509 Iron deficiency anemia, unspecified: Secondary | ICD-10-CM | POA: Diagnosis not present

## 2019-05-04 ENCOUNTER — Encounter: Payer: Self-pay | Admitting: Adult Health

## 2019-05-04 ENCOUNTER — Non-Acute Institutional Stay (SKILLED_NURSING_FACILITY): Payer: Medicare Other | Admitting: Adult Health

## 2019-05-04 DIAGNOSIS — C189 Malignant neoplasm of colon, unspecified: Secondary | ICD-10-CM | POA: Diagnosis not present

## 2019-05-04 DIAGNOSIS — Z794 Long term (current) use of insulin: Secondary | ICD-10-CM | POA: Diagnosis not present

## 2019-05-04 DIAGNOSIS — N2581 Secondary hyperparathyroidism of renal origin: Secondary | ICD-10-CM | POA: Diagnosis not present

## 2019-05-04 DIAGNOSIS — E11319 Type 2 diabetes mellitus with unspecified diabetic retinopathy without macular edema: Secondary | ICD-10-CM

## 2019-05-04 DIAGNOSIS — Z992 Dependence on renal dialysis: Secondary | ICD-10-CM

## 2019-05-04 DIAGNOSIS — E1122 Type 2 diabetes mellitus with diabetic chronic kidney disease: Secondary | ICD-10-CM | POA: Diagnosis not present

## 2019-05-04 DIAGNOSIS — N186 End stage renal disease: Secondary | ICD-10-CM | POA: Diagnosis not present

## 2019-05-04 DIAGNOSIS — D509 Iron deficiency anemia, unspecified: Secondary | ICD-10-CM | POA: Diagnosis not present

## 2019-05-04 DIAGNOSIS — D631 Anemia in chronic kidney disease: Secondary | ICD-10-CM | POA: Diagnosis not present

## 2019-05-04 NOTE — Progress Notes (Signed)
Location:   [place] Room Number: 112 D Place of Service:  SNF (31)   CODE STATUS: Full Code  No Known Allergies  Chief Complaint  Patient presents with  . Acute Visit    Care Plan Meeting    HPI:  We have come together for his care plan meeting. He is ambulating with a walker nondialysis days. There are no reports of recent falls; no significant weight change. No reports of uncontrolled pain; anxiety or agitation.   Past Medical History:  Diagnosis Date  . Abnormal CT scan, kidney 10/06/2011  . Acute pyelonephritis 10/07/2011  . Anemia    normocytic  . Anxiety    mental retardation  . Bladder wall thickening 10/06/2011  . BPH (benign prostatic hypertrophy)   . Diabetes mellitus   . Dialysis patient [facility])    Tuesday, Thursday and Saturday,   . DVT of leg (deep venous thrombosis) ([place]) 12/25/2016  . Edema     history of lower extremity edema  . GERD (gastroesophageal reflux disease)   . Heme positive stool   . Hydronephrosis   . Hyperkalemia   . Hyperlipidemia   . Hypernatremia   . Hypertension   . Hypothyroidism   . Impaired speech   . 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 MR (mental retardation)   . Muscle weakness   . Obstructive uropathy   . 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 Poor historian poor historian  . Protein calorie malnutrition ([place])   . Pyelonephritis   . Renal failure (ARF), acute on chronic (HCC)   . Renal insufficiency    chronic history  . Sepsis ([place])   . Smoking   . Uremia   . Urinary retention   . UTI (lower urinary tract infection) 10/06/2011    Past Surgical History:  Procedure Laterality Date  . A/V FISTULAGRAM N/A 08/13/2018   Procedure: A/V FISTULAGRAM - Right Upper;  Surgeon: Elam Dutch, MD;  Location: [place] CV LAB;  Service: Cardiovascular;  Laterality: N/A;  . A/V FISTULAGRAM N/A 11/22/2018   Procedure: A/V FISTULAGRAM - Right Upper;  Surgeon: Waynetta Sandy, MD;   Location: [place] CV LAB;  Service: Cardiovascular;  Laterality: N/A;  . AV FISTULA PLACEMENT Left 07/06/2015   Procedure:  INSERTION LEFT ARM ARTERIOVENOUS GORTEX GRAFT;  Surgeon: Angelia Mould, MD;  Location: [place];  Service: Vascular;  Laterality: Left;  . AV FISTULA PLACEMENT Right 02/26/2016   Procedure: ARTERIOVENOUS (AV) FISTULA CREATION ;  Surgeon: Angelia Mould, MD;  Location: [place];  Service: Vascular;  Laterality: Right;  . AV FISTULA PLACEMENT Right 11/25/2018   Procedure: INSERTION OF ARTERIOVENOUS (AV) ARTEGRAFT RIGHT UPPER ARM;  Surgeon: Waynetta Sandy, MD;  Location: [place];  Service: Vascular;  Laterality: Right;  . [place] REMOVAL Left 10/09/2015   Procedure: REMOVAL OF ARTERIOVENOUS GORETEX GRAFT ([place]) Evacuation of Lymphocele, Vein Patch angioplasty of brachial artery.;  Surgeon: Angelia Mould, MD;  Location: [place];  Service: Vascular;  Laterality: Left;  . BASCILIC VEIN TRANSPOSITION Right 02/26/2016   Procedure: Right BASCILIC VEIN TRANSPOSITION;  Surgeon: Angelia Mould, MD;  Location: [place];  Service: Vascular;  Laterality: Right;  . CIRCUMCISION N/A 01/04/2014   Procedure: CIRCUMCISION ADULT (procedure #1);  Surgeon: Marissa Nestle, MD;  Location: [facility];  Service: Urology;  Laterality: N/A;  . COLECTOMY N/A 05/04/2017   Procedure: TOTAL COLECTOMY;  Surgeon: Aviva Signs, MD;  Location: [facility];  Service:  General;  Laterality: N/A;  . COLONOSCOPY N/A 04/27/2017   Procedure: COLONOSCOPY;  Surgeon: Daneil Dolin, MD;  [facility];  Service: Endoscopy;  Laterality: N/A;  245  . CYSTOSCOPY W/ RETROGRADES Bilateral 06/29/2015   Procedure: CYSTOSCOPY, DILATION OF URETHRAL STRICTURE WITH BILATERAL RETROGRADE PYELOGRAM,SUPRAPUBIC TUBE CHANGE;  Surgeon: Festus Aloe, MD;  Location: [facility];  Service: Urology;  Laterality: Bilateral;  . CYSTOSCOPY WITH URETHRAL DILATATION N/A 12/29/2013   Procedure: CYSTOSCOPY WITH URETHRAL DILATATION;   Surgeon: Marissa Nestle, MD;  [facility];  Service: Urology;  Laterality: N/A;  . ESOPHAGOGASTRODUODENOSCOPY N/A 04/27/2017   Procedure: ESOPHAGOGASTRODUODENOSCOPY (EGD);  Surgeon: Daneil Dolin, MD;  [facility];  Service: Endoscopy;  Laterality: N/A;  . INSERTION OF DIALYSIS CATHETER Right 11/25/2018   Procedure: INSERTION OF DIALYSIS CATHETER RIGHT INTERNAL JUGULAR;  Surgeon: Waynetta Sandy, MD;  Location: [place];  Service: Vascular;  Laterality: Right;  . IR AV DIALY SHUNT INTRO [place] W/PTA/IMG RIGHT Right 09/07/2018  . IR REMOVAL TUN CV CATH W/O FL  01/12/2019  . IR THROMBECTOMY AV FISTULA W/THROMBOLYSIS/PTA INC/SHUNT/IMG RIGHT Right 04/26/2018  . IR US GUIDE VASC ACCESS RIGHT  04/26/2018  . IR US GUIDE VASC ACCESS RIGHT  09/07/2018  . ORIF FEMUR FRACTURE Right 11/22/2016   Procedure: OPEN REDUCTION INTERNAL FIXATION (ORIF) DISTAL FEMUR FRACTURE;  Surgeon: Rod Can, MD;  Location: [place];  Service: Orthopedics;  Laterality: Right;  . PATCH ANGIOPLASTY Right 12/10/2017   Procedure: PATCH ANGIOPLASTY;  Surgeon: Angelia Mould, MD;  Location: [facility];  Service: Vascular;  Laterality: Right;  . PERIPHERAL VASCULAR BALLOON ANGIOPLASTY  08/13/2018   Procedure: PERIPHERAL VASCULAR BALLOON ANGIOPLASTY;  Surgeon: Elam Dutch, MD;  Location: [place] [facility];  Service: Cardiovascular;;  right AV fistula   . PERIPHERAL VASCULAR BALLOON ANGIOPLASTY  11/22/2018   Procedure: PERIPHERAL VASCULAR BALLOON ANGIOPLASTY;  Surgeon: Waynetta Sandy, MD;  Location: [place] CV LAB;  Service: Cardiovascular;;  rt AV fistula  . PERIPHERAL VASCULAR CATHETERIZATION N/A 10/08/2015   Procedure: A/V Shuntogram;  Surgeon: Angelia Mould, MD;  Location: [place] CV LAB;  Service: Cardiovascular;  Laterality: N/A;  . THROMBECTOMY W/ EMBOLECTOMY Right 12/10/2017   Procedure: THROMBECTOMY REVISION RIGHT ARM  ARTERIOVENOUS FISTULA;  Surgeon:  Angelia Mould, MD;  Location: [place];  Service: Vascular;  Laterality: Right;  . TRANSURETHRAL RESECTION OF PROSTATE N/A 01/04/2014   Procedure: TRANSURETHRAL RESECTION OF THE PROSTATE (TURP) (procedure #2);  Surgeon: Marissa Nestle, MD;  [facility];  Service: Urology;  Laterality: N/A;    Social History   Socioeconomic History  . Marital status: Single    Spouse name: Not on file  . Number of children: Not on file  . Years of education: Not on file  . Highest education level: Not on file  Occupational History  . Not on file  Social Needs  . Financial resource strain: Not hard at all  . Food insecurity    Worry: Never true    Inability: Never true  . Transportation needs    Medical: No    Non-medical: No  Tobacco Use  . Smoking status: Never Smoker  . Smokeless tobacco: Never Used  Substance and Sexual Activity  . Alcohol use: No  . Drug use: No  . Sexual activity: Never  Lifestyle  . Physical activity    Days per week: 0 days    Minutes per session: Not on file  .  Stress: Not at all  Relationships  . Social connections    Talks on phone: More than three times a week    Gets together: More than three times a week    Attends religious service: Never    Active member of club or organization: No    Attends meetings of clubs or organizations: Never    Relationship status: Never married  . Intimate partner violence    Fear of current or ex partner: No    Emotionally abused: No    Physically abused: No    Forced sexual activity: No  Other Topics Concern  . Not on file  Social History Narrative   Lives at nursing home.   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   Family History  Problem Relation Age of Onset  . Cancer Mother   . Colon cancer Neg Hx       VITAL SIGNS BP 137/71   Pulse 74   Temp 98.4 F (36.9 C)   Resp 20   Ht 5\' 8"  (1.727 m)   Wt 207 lb 12.8 oz (94.3 kg)   BMI 31.60 kg/m   Outpatient  Encounter Medications as of 05/04/2019  Medication Sig  . atorvastatin (LIPITOR) 80 MG tablet Take 80 mg by mouth every evening.  . insulin aspart (NOVOLOG FLEXPEN) 100 UNIT/ML FlexPen Inject as Per sliding scale before meals:  < 60, call MD 200 - 300 = 8 units 301 - 400 = 10 units  . Insulin Glargine (BASAGLAR KWIKPEN) 100 UNIT/ML SOPN Inject 24 Units into the skin at bedtime.   Marland Kitchen JANUVIA 25 MG tablet Take 25 mg by mouth daily.   Marland Kitchen levothyroxine (SYNTHROID, LEVOTHROID) 88 MCG tablet Take 88 mcg by mouth daily before breakfast.  . midodrine (PROAMATINE) 5 MG tablet Take 5 mg by mouth every Monday, Wednesday, and Friday with hemodialysis. Send med with resident to dialysis.  Do not give at [facility], give to resident to take with hime  . Multiple Vitamin (MULTIVITAMIN WITH MINERALS) TABS tablet Take 1 tablet by mouth every evening.   . NON FORMULARY Fluid Restriction - 1200 cc a day 500 cc for days, Evenings 500 cc, Night 200 cc  . NON FORMULARY Diet Type:  NAS, Cons CHO Diet  . Nutritional Supplements (NEPRO/CARBSTEADY PO) Take 1 Can by mouth 2 (two) times daily.  . Omega-3 Fatty Acids (FISH OIL PO) 100-160-1,000 - take one capsule by mouth daily  . omeprazole (PRILOSEC) 40 MG capsule Take 40 mg by mouth at bedtime.   . polyethylene glycol (MIRALAX / GLYCOLAX) packet Take 17 g by mouth as needed for mild constipation.   . sevelamer carbonate (RENVELA) 800 MG tablet Take 800-1,600 mg by mouth See admin instructions. Take 1600 mg by mouth 3 times daily with meals and take 800 mg by mouth with snacks  . tamsulosin (FLOMAX) 0.4 MG CAPS capsule Take 0.4 mg by mouth every evening. Give 30 minutes after a meal  . torsemide (DEMADEX) 10 MG tablet Take 5 tablets (50 mg total) by mouth daily.  Marland Kitchen UNABLE TO FIND Microguard Powder - For Yeast / Fungal - Clean well with soap and water.  Apply powder 2-3 times daily for 14 days/  Apply to groin / scrotum area 3 times daily   No facility-administered  encounter medications on file as of 05/04/2019.      SIGNIFICANT DIAGNOSTIC EXAMS  LABS REVIEWED PREVIOUS;  12-06-18: tsh 2.752 01-25-19: wbc 5.6; hgb 12.9 hct 40.8;  mcv 110.3; plt 146; glucose 334; bun 25; creat 6.78; k+ 4.7; na++ 137; ca 9.0 liver normal albumin 4.0  Hgb a1c 6.9; chol 94 trig 439 LDL direct 16.3  NO NEW LABS.    Review of Systems  Constitutional: Negative for malaise/fatigue.  Respiratory: Negative for cough and shortness of breath.   Cardiovascular: Negative for chest pain, palpitations and leg swelling.  Gastrointestinal: Negative for abdominal pain, constipation and heartburn.  Musculoskeletal: Negative for back pain, joint pain and myalgias.  Skin: Negative.   Neurological: Negative for dizziness.  Psychiatric/Behavioral: The patient is not nervous/anxious.     Physical Exam Constitutional:      General: He is not in acute distress.    Appearance: He is well-developed. He is obese. He is not diaphoretic.  Neck:     Musculoskeletal: Neck supple.     Thyroid: No thyromegaly.  Cardiovascular:     Rate and Rhythm: Normal rate and regular rhythm.     Pulses: Normal pulses.     Heart sounds: Normal heart sounds.  Pulmonary:     Effort: Pulmonary effort is normal. No respiratory distress.     Breath sounds: Normal breath sounds.  Abdominal:     General: Bowel sounds are normal. There is no distension.     Palpations: Abdomen is soft.     Tenderness: There is no abdominal tenderness.     Comments: History of colectomy  Genitourinary:    Comments: History of turp Musculoskeletal:     Right lower leg: No edema.     Left lower leg: No edema.     Comments:  Is able to move all extremities History of right femur ORIF    Lymphadenopathy:     Cervical: No cervical adenopathy.  Skin:    General: Skin is warm and dry.     Comments: Right upper extremity a/v fistula: + thrill + bruit       Neurological:     General: No focal deficit present.     Mental  Status: He is alert.  Psychiatric:        Mood and Affect: Mood normal.       ASSESSMENT/ PLAN:  TODAY   1. 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 Malignant neoplasm of colon unspecified part of colon  Will continue therapy as directed Will continue current medications Will continue to monitor her status.      MD is aware of resident's narcotic use and is in agreement with current plan of care. We will attempt to wean resident as apropriate   Ok Edwards NP [facility]  Contact [phone] Monday through Friday 8am- 5pm  After hours call [phone]

## 2019-05-06 ENCOUNTER — Non-Acute Institutional Stay (SKILLED_NURSING_FACILITY): Payer: Medicare Other | Admitting: Adult Health

## 2019-05-06 ENCOUNTER — Encounter: Payer: Self-pay | Admitting: Adult Health

## 2019-05-06 DIAGNOSIS — E1122 Type 2 diabetes mellitus with diabetic chronic kidney disease: Secondary | ICD-10-CM

## 2019-05-06 DIAGNOSIS — Z992 Dependence on renal dialysis: Secondary | ICD-10-CM | POA: Diagnosis not present

## 2019-05-06 DIAGNOSIS — D631 Anemia in chronic kidney disease: Secondary | ICD-10-CM | POA: Diagnosis not present

## 2019-05-06 DIAGNOSIS — K219 Gastro-esophageal reflux disease without esophagitis: Secondary | ICD-10-CM

## 2019-05-06 DIAGNOSIS — E1165 Type 2 diabetes mellitus with hyperglycemia: Secondary | ICD-10-CM

## 2019-05-06 DIAGNOSIS — E1169 Type 2 diabetes mellitus with other specified complication: Secondary | ICD-10-CM

## 2019-05-06 DIAGNOSIS — N2581 Secondary hyperparathyroidism of renal origin: Secondary | ICD-10-CM | POA: Diagnosis not present

## 2019-05-06 DIAGNOSIS — F4322 Adjustment disorder with anxiety: Secondary | ICD-10-CM | POA: Diagnosis not present

## 2019-05-06 DIAGNOSIS — IMO0002 Reserved for concepts with insufficient information to code with codable children: Secondary | ICD-10-CM

## 2019-05-06 DIAGNOSIS — D509 Iron deficiency anemia, unspecified: Secondary | ICD-10-CM | POA: Diagnosis not present

## 2019-05-06 DIAGNOSIS — N186 End stage renal disease: Secondary | ICD-10-CM

## 2019-05-06 DIAGNOSIS — F70 Mild intellectual disabilities: Secondary | ICD-10-CM | POA: Diagnosis not present

## 2019-05-06 DIAGNOSIS — E785 Hyperlipidemia, unspecified: Secondary | ICD-10-CM | POA: Diagnosis not present

## 2019-05-06 NOTE — Progress Notes (Signed)
Location:   [facility] Room Number: 112 D Place of Service:  SNF (31)   CODE STATUS: Full Code  No Known Allergies  Chief Complaint  Patient presents with  . 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 He denies any uncontrolled pain; no changes in his appetite. No excessive fatigue hunger or thirst. There are no reports of fevers present.   Past Medical History:  Diagnosis Date  . Abnormal CT scan, kidney 10/06/2011  . Acute pyelonephritis 10/07/2011  . Anemia    normocytic  . Anxiety    mental retardation  . Bladder wall thickening 10/06/2011  . BPH (benign prostatic hypertrophy)   . Diabetes mellitus   . Dialysis patient [facility])    Tuesday, Thursday and Saturday,   . DVT of leg (deep venous thrombosis) ([place]) 12/25/2016  . Edema     history of lower extremity edema  . GERD (gastroesophageal reflux disease)   . Heme positive stool   . Hydronephrosis   . Hyperkalemia   . Hyperlipidemia   . Hypernatremia   . Hypertension   . Hypothyroidism   . Impaired speech   . 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 MR (mental retardation)   . Muscle weakness   . Obstructive uropathy   . 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 Poor historian poor historian  . Protein calorie malnutrition ([place])   . Pyelonephritis   . Renal failure (ARF), acute on chronic (HCC)   . Renal insufficiency    chronic history  . Sepsis ([place])   . Smoking   . Uremia   . Urinary retention   . UTI (lower urinary tract infection) 10/06/2011    Past Surgical History:  Procedure Laterality Date  . A/V FISTULAGRAM N/A 08/13/2018   Procedure: A/V FISTULAGRAM - Right Upper;   Surgeon: Elam Dutch, MD;  Location: [place] [facility];  Service: Cardiovascular;  Laterality: N/A;  . A/V FISTULAGRAM N/A 11/22/2018   Procedure: A/V FISTULAGRAM - Right Upper;  Surgeon: Waynetta Sandy, MD;  Location: [place] [facility];  Service: Cardiovascular;  Laterality: N/A;  . AV FISTULA PLACEMENT Left 07/06/2015   Procedure:  INSERTION LEFT ARM ARTERIOVENOUS GORTEX GRAFT;  Surgeon: Angelia Mould, MD;  Location: [place];  Service: Vascular;  Laterality: Left;  . AV FISTULA PLACEMENT Right 02/26/2016   Procedure: ARTERIOVENOUS (AV) FISTULA CREATION ;  Surgeon: Angelia Mould, MD;  Location: [place];  Service: Vascular;  Laterality: Right;  . AV FISTULA PLACEMENT Right 11/25/2018   Procedure: INSERTION OF ARTERIOVENOUS (AV) ARTEGRAFT RIGHT UPPER ARM;  Surgeon: Waynetta Sandy, MD;  Location: [place];  Service: Vascular;  Laterality: Right;  . [place] REMOVAL Left 10/09/2015   Procedure: REMOVAL OF ARTERIOVENOUS GORETEX GRAFT ([place]) Evacuation of Lymphocele, Vein Patch angioplasty of brachial artery.;  Surgeon: Angelia Mould, MD;  Location: [place];  Service: Vascular;  Laterality: Left;  . BASCILIC VEIN TRANSPOSITION Right 02/26/2016   Procedure: Right BASCILIC VEIN TRANSPOSITION;  Surgeon: Angelia Mould, MD;  Location: [place];  Service: Vascular;  Laterality: Right;  . CIRCUMCISION N/A 01/04/2014   Procedure: CIRCUMCISION ADULT (procedure #1);  Surgeon:  Marissa Nestle, MD;  [facility];  Service: Urology;  Laterality: N/A;  . COLECTOMY N/A 05/04/2017   Procedure: TOTAL COLECTOMY;  Surgeon: Aviva Signs, MD;  [facility];  Service: General;  Laterality: N/A;  . COLONOSCOPY N/A 04/27/2017   Procedure: COLONOSCOPY;  Surgeon: Daneil Dolin, MD;  [facility];  Service: Endoscopy;  Laterality: N/A;  245  . CYSTOSCOPY W/ RETROGRADES Bilateral 06/29/2015   Procedure: CYSTOSCOPY, DILATION OF URETHRAL STRICTURE WITH BILATERAL RETROGRADE  PYELOGRAM,SUPRAPUBIC TUBE CHANGE;  Surgeon: Festus Aloe, MD;  Location: [facility];  Service: Urology;  Laterality: Bilateral;  . CYSTOSCOPY WITH URETHRAL DILATATION N/A 12/29/2013   Procedure: CYSTOSCOPY WITH URETHRAL DILATATION;  Surgeon: Marissa Nestle, MD;  [facility];  Service: Urology;  Laterality: N/A;  . ESOPHAGOGASTRODUODENOSCOPY N/A 04/27/2017   Procedure: ESOPHAGOGASTRODUODENOSCOPY (EGD);  Surgeon: Daneil Dolin, MD;  [facility];  Service: Endoscopy;  Laterality: N/A;  . INSERTION OF DIALYSIS CATHETER Right 11/25/2018   Procedure: INSERTION OF DIALYSIS CATHETER RIGHT INTERNAL JUGULAR;  Surgeon: Waynetta Sandy, MD;  Location: [place];  Service: Vascular;  Laterality: Right;  . IR AV DIALY SHUNT INTRO [place] W/PTA/IMG RIGHT Right 09/07/2018  . IR REMOVAL TUN CV CATH W/O FL  01/12/2019  . IR THROMBECTOMY AV FISTULA W/THROMBOLYSIS/PTA INC/SHUNT/IMG RIGHT Right 04/26/2018  . IR US GUIDE VASC ACCESS RIGHT  04/26/2018  . IR US GUIDE VASC ACCESS RIGHT  09/07/2018  . ORIF FEMUR FRACTURE Right 11/22/2016   Procedure: OPEN REDUCTION INTERNAL FIXATION (ORIF) DISTAL FEMUR FRACTURE;  Surgeon: Rod Can, MD;  Location: [place];  Service: Orthopedics;  Laterality: Right;  . PATCH ANGIOPLASTY Right 12/10/2017   Procedure: PATCH ANGIOPLASTY;  Surgeon: Angelia Mould, MD;  Location: [facility];  Service: Vascular;  Laterality: Right;  . PERIPHERAL VASCULAR BALLOON ANGIOPLASTY  08/13/2018   Procedure: PERIPHERAL VASCULAR BALLOON ANGIOPLASTY;  Surgeon: Elam Dutch, MD;  Location: [place] [facility];  Service: Cardiovascular;;  right AV fistula   . PERIPHERAL VASCULAR BALLOON ANGIOPLASTY  11/22/2018   Procedure: PERIPHERAL VASCULAR BALLOON ANGIOPLASTY;  Surgeon: Waynetta Sandy, MD;  Location: [place] [facility];  Service: Cardiovascular;;  rt AV fistula  . PERIPHERAL VASCULAR CATHETERIZATION N/A 10/08/2015   Procedure: A/V Shuntogram;  Surgeon:  Angelia Mould, MD;  Location: [place] [facility];  Service: Cardiovascular;  Laterality: N/A;  . THROMBECTOMY W/ EMBOLECTOMY Right 12/10/2017   Procedure: THROMBECTOMY REVISION RIGHT ARM  ARTERIOVENOUS FISTULA;  Surgeon: Angelia Mould, MD;  Location: [place];  Service: Vascular;  Laterality: Right;  . TRANSURETHRAL RESECTION OF PROSTATE N/A 01/04/2014   Procedure: TRANSURETHRAL RESECTION OF THE PROSTATE (TURP) (procedure #2);  Surgeon: Marissa Nestle, MD;  [facility];  Service: Urology;  Laterality: N/A;    Social History   Socioeconomic History  . Marital status: Single    Spouse name: Not on file  . Number of children: Not on file  . Years of education: Not on file  . Highest education level: Not on file  Occupational History  . Not on file  Social Needs  . Financial resource strain: Not hard at all  . Food insecurity    Worry: Never true    Inability: Never true  . Transportation needs    Medical: No    Non-medical: No  Tobacco Use  . Smoking status: Never Smoker  . Smokeless tobacco: Never Used  Substance and Sexual Activity  . Alcohol use: No  .  Drug use: No  . Sexual activity: Never  Lifestyle  . Physical activity    Days per week: 0 days    Minutes per session: Not on file  . Stress: Not at all  Relationships  . Social connections    Talks on phone: More than three times a week    Gets together: More than three times a week    Attends religious service: Never    Active member of club or organization: No    Attends meetings of clubs or organizations: Never    Relationship status: Never married  . Intimate partner violence    Fear of current or ex partner: No    Emotionally abused: No    Physically abused: No    Forced sexual activity: No  Other Topics Concern  . Not on file  Social History Narrative   Lives at nursing home.   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   Family History  Problem  Relation Age of Onset  . Cancer Mother   . Colon cancer Neg Hx       VITAL SIGNS BP (!) 137/51   Pulse 74   Temp 98.4 F (36.9 C)   Resp 20   Ht 5\' 8"  (1.727 m)   Wt 207 lb 12.8 oz (94.3 kg)   BMI 31.60 kg/m   Outpatient Encounter Medications as of 05/06/2019  Medication Sig  . atorvastatin (LIPITOR) 80 MG tablet Take 80 mg by mouth every evening.  . insulin aspart (NOVOLOG FLEXPEN) 100 UNIT/ML FlexPen Inject as Per sliding scale before meals:  < 60, call MD 200 - 300 = 8 units 301 - 400 = 10 units  . Insulin Glargine (BASAGLAR KWIKPEN) 100 UNIT/ML SOPN Inject 24 Units into the skin at bedtime.   Marland Kitchen JANUVIA 25 MG tablet Take 25 mg by mouth daily.   Marland Kitchen levothyroxine (SYNTHROID, LEVOTHROID) 88 MCG tablet Take 88 mcg by mouth daily before breakfast.  . midodrine (PROAMATINE) 5 MG tablet Take 5 mg by mouth every Monday, Wednesday, and Friday with hemodialysis. Send med with resident to dialysis.  Do not give at [facility], give to resident to take with hime  . Multiple Vitamin (MULTIVITAMIN WITH MINERALS) TABS tablet Take 1 tablet by mouth every evening.   . NON FORMULARY Fluid Restriction - 1200 cc a day 500 cc for days, Evenings 500 cc, Night 200 cc  . NON FORMULARY Diet Type:  NAS, Cons CHO Diet  . Nutritional Supplements (NEPRO/CARBSTEADY PO) Take 1 Can by mouth 2 (two) times daily.  . Omega-3 Fatty Acids (FISH OIL PO) 100-160-1,000 - take one capsule by mouth daily  . omeprazole (PRILOSEC) 40 MG capsule Take 40 mg by mouth at bedtime.   . polyethylene glycol (MIRALAX / GLYCOLAX) packet Take 17 g by mouth as needed for mild constipation.   . sevelamer carbonate (RENVELA) 800 MG tablet Take 800-1,600 mg by mouth See admin instructions. Take 1600 mg by mouth 3 times daily with meals and take 800 mg by mouth with snacks  . tamsulosin (FLOMAX) 0.4 MG CAPS capsule Take 0.4 mg by mouth every evening. Give 30 minutes after a meal  . torsemide (DEMADEX) 10 MG tablet Take 5 tablets (50 mg  total) by mouth daily.  Marland Kitchen UNABLE TO FIND Microguard Powder - For Yeast / Fungal - Clean well with soap and water.  Apply powder 2-3 times daily for 14 days/  Apply to groin / scrotum area 3 times daily  No facility-administered encounter medications on file as of 05/06/2019.      SIGNIFICANT DIAGNOSTIC EXAMS   LABS REVIEWED PREVIOUS;  12-06-18: tsh 2.752 01-25-19: wbc 5.6; hgb 12.9 hct 40.8; mcv 110.3; plt 146; glucose 334; bun 25; creat 6.78; k+ 4.7; na++ 137; ca 9.0 liver normal albumin 4.0  Hgb a1c 6.9; chol 94 trig 439 LDL direct 16.3  NO NEW LABS.    Review of Systems  Constitutional: Negative for malaise/fatigue.  Respiratory: Negative for cough and shortness of breath.   Cardiovascular: Negative for chest pain, palpitations and leg swelling.  Gastrointestinal: Negative for abdominal pain, constipation and heartburn.  Musculoskeletal: Negative for back pain, joint pain and myalgias.  Skin: Negative.   Neurological: Negative for dizziness.  Psychiatric/Behavioral: The patient is not nervous/anxious.    Physical Exam Constitutional:      General: He is not in acute distress.    Appearance: He is well-developed. He is obese. He is not diaphoretic.  Neck:     Musculoskeletal: Neck supple.     Thyroid: No thyromegaly.  Cardiovascular:     Rate and Rhythm: Normal rate and regular rhythm.     Pulses: Normal pulses.     Heart sounds: Normal heart sounds.  Pulmonary:     Effort: Pulmonary effort is normal. No respiratory distress.     Breath sounds: Normal breath sounds.  Abdominal:     General: Bowel sounds are normal. There is no distension.     Palpations: Abdomen is soft.     Tenderness: There is no abdominal tenderness.     Comments: History of colectomy   Genitourinary:    Comments: History of turp  Musculoskeletal:     Right lower leg: No edema.     Left lower leg: No edema.     Comments:  Is able to move all extremities History of right femur ORIF      Lymphadenopathy:     Cervical: No cervical adenopathy.  Skin:    General: Skin is warm and dry.     Comments: Right upper extremity a/v fistula: + thrill + bruit        Neurological:     Mental Status: He is alert. Mental status is at baseline.  Psychiatric:        Mood and Affect: Mood normal.       ASSESSMENT/ PLAN:  TODAY   1. GERD without esophagitis: is stable will continue prilosec 40 mg daily   2. 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 Dyslipidemia associated with type 2 diabetes mellitus: is stable trgi 439; will continue lipitor 80 mg daily and fish oil daily    PREVIOUS   4. End stage renal disease on hemodialysis due to type 2 diabetes mellitus: is stable; will continue dialysis three times will continue midodrine 5 mg prior to dialysis  weekly 1200 cc fluid restriction; renvela 1600 mg with meals and 800 mg with snacks.   5. Other specified hypothyroidism: is stable tsh 2.752; will continue synthroid 88 mcg daily   6. 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 Bilateral lower extremity edema; is stable will continue demadex 50 mg daily   8. Orthostatic hypotension: is stable b/p  137/51: will continue midodrine 5 mg prior to dialysis  Will check hgb a1c 05-10-19     MD is aware of resident's narcotic use and is in agreement with current plan of care. We will attempt to wean resident as apropriate   Ok Edwards NP [facility]  Contact [phone] Monday through Friday 8am- 5pm  After hours call [phone]

## 2019-05-09 DIAGNOSIS — D509 Iron deficiency anemia, unspecified: Secondary | ICD-10-CM | POA: Diagnosis not present

## 2019-05-09 DIAGNOSIS — E119 Type 2 diabetes mellitus without complications: Secondary | ICD-10-CM | POA: Diagnosis not present

## 2019-05-09 DIAGNOSIS — N186 End stage renal disease: Secondary | ICD-10-CM | POA: Diagnosis not present

## 2019-05-09 DIAGNOSIS — D631 Anemia in chronic kidney disease: Secondary | ICD-10-CM | POA: Diagnosis not present

## 2019-05-09 DIAGNOSIS — Z992 Dependence on renal dialysis: Secondary | ICD-10-CM | POA: Diagnosis not present

## 2019-05-09 DIAGNOSIS — N2581 Secondary hyperparathyroidism of renal origin: Secondary | ICD-10-CM | POA: Diagnosis not present

## 2019-05-10 ENCOUNTER — Encounter (HOSPITAL_COMMUNITY)
Admission: RE | Admit: 2019-05-10 | Discharge: 2019-05-10 | Disposition: A | Discharge status: Home / Self Care | Payer: Medicare Other | Source: Ambulatory Visit | Attending: Adult Health | Admitting: Adult Health

## 2019-05-10 DIAGNOSIS — E1129 Type 2 diabetes mellitus with other diabetic kidney complication: Secondary | ICD-10-CM | POA: Insufficient documentation

## 2019-05-10 LAB — HEMOGLOBIN A1C
Hgb A1c MFr Bld: 7.6 % — ABNORMAL HIGH (ref 4.8–5.6)
Mean Plasma Glucose: 171.42 mg/dL

## 2019-05-11 DIAGNOSIS — Z992 Dependence on renal dialysis: Secondary | ICD-10-CM | POA: Diagnosis not present

## 2019-05-11 DIAGNOSIS — N186 End stage renal disease: Secondary | ICD-10-CM | POA: Diagnosis not present

## 2019-05-11 DIAGNOSIS — D631 Anemia in chronic kidney disease: Secondary | ICD-10-CM | POA: Diagnosis not present

## 2019-05-11 DIAGNOSIS — N2581 Secondary hyperparathyroidism of renal origin: Secondary | ICD-10-CM | POA: Diagnosis not present

## 2019-05-11 DIAGNOSIS — D509 Iron deficiency anemia, unspecified: Secondary | ICD-10-CM | POA: Diagnosis not present

## 2019-05-13 DIAGNOSIS — N186 End stage renal disease: Secondary | ICD-10-CM | POA: Diagnosis not present

## 2019-05-13 DIAGNOSIS — Z992 Dependence on renal dialysis: Secondary | ICD-10-CM | POA: Diagnosis not present

## 2019-05-13 DIAGNOSIS — D509 Iron deficiency anemia, unspecified: Secondary | ICD-10-CM | POA: Diagnosis not present

## 2019-05-13 DIAGNOSIS — D631 Anemia in chronic kidney disease: Secondary | ICD-10-CM | POA: Diagnosis not present

## 2019-05-13 DIAGNOSIS — N2581 Secondary hyperparathyroidism of renal origin: Secondary | ICD-10-CM | POA: Diagnosis not present

## 2019-05-16 DIAGNOSIS — N186 End stage renal disease: Secondary | ICD-10-CM | POA: Diagnosis not present

## 2019-05-16 DIAGNOSIS — D631 Anemia in chronic kidney disease: Secondary | ICD-10-CM | POA: Diagnosis not present

## 2019-05-16 DIAGNOSIS — Z992 Dependence on renal dialysis: Secondary | ICD-10-CM | POA: Diagnosis not present

## 2019-05-16 DIAGNOSIS — N2581 Secondary hyperparathyroidism of renal origin: Secondary | ICD-10-CM | POA: Diagnosis not present

## 2019-05-16 DIAGNOSIS — D509 Iron deficiency anemia, unspecified: Secondary | ICD-10-CM | POA: Diagnosis not present

## 2019-05-18 DIAGNOSIS — Z992 Dependence on renal dialysis: Secondary | ICD-10-CM | POA: Diagnosis not present

## 2019-05-18 DIAGNOSIS — N186 End stage renal disease: Secondary | ICD-10-CM | POA: Diagnosis not present

## 2019-05-18 DIAGNOSIS — D509 Iron deficiency anemia, unspecified: Secondary | ICD-10-CM | POA: Diagnosis not present

## 2019-05-18 DIAGNOSIS — D631 Anemia in chronic kidney disease: Secondary | ICD-10-CM | POA: Diagnosis not present

## 2019-05-18 DIAGNOSIS — N2581 Secondary hyperparathyroidism of renal origin: Secondary | ICD-10-CM | POA: Diagnosis not present

## 2019-05-20 DIAGNOSIS — N186 End stage renal disease: Secondary | ICD-10-CM | POA: Diagnosis not present

## 2019-05-20 DIAGNOSIS — D509 Iron deficiency anemia, unspecified: Secondary | ICD-10-CM | POA: Diagnosis not present

## 2019-05-20 DIAGNOSIS — Z992 Dependence on renal dialysis: Secondary | ICD-10-CM | POA: Diagnosis not present

## 2019-05-20 DIAGNOSIS — D631 Anemia in chronic kidney disease: Secondary | ICD-10-CM | POA: Diagnosis not present

## 2019-05-20 DIAGNOSIS — N2581 Secondary hyperparathyroidism of renal origin: Secondary | ICD-10-CM | POA: Diagnosis not present

## 2019-05-23 DIAGNOSIS — D631 Anemia in chronic kidney disease: Secondary | ICD-10-CM | POA: Diagnosis not present

## 2019-05-23 DIAGNOSIS — D509 Iron deficiency anemia, unspecified: Secondary | ICD-10-CM | POA: Diagnosis not present

## 2019-05-23 DIAGNOSIS — N2581 Secondary hyperparathyroidism of renal origin: Secondary | ICD-10-CM | POA: Diagnosis not present

## 2019-05-23 DIAGNOSIS — Z992 Dependence on renal dialysis: Secondary | ICD-10-CM | POA: Diagnosis not present

## 2019-05-23 DIAGNOSIS — N186 End stage renal disease: Secondary | ICD-10-CM | POA: Diagnosis not present

## 2019-05-25 DIAGNOSIS — N2581 Secondary hyperparathyroidism of renal origin: Secondary | ICD-10-CM | POA: Diagnosis not present

## 2019-05-25 DIAGNOSIS — N186 End stage renal disease: Secondary | ICD-10-CM | POA: Diagnosis not present

## 2019-05-25 DIAGNOSIS — D509 Iron deficiency anemia, unspecified: Secondary | ICD-10-CM | POA: Diagnosis not present

## 2019-05-25 DIAGNOSIS — D631 Anemia in chronic kidney disease: Secondary | ICD-10-CM | POA: Diagnosis not present

## 2019-05-25 DIAGNOSIS — Z992 Dependence on renal dialysis: Secondary | ICD-10-CM | POA: Diagnosis not present

## 2019-05-26 ENCOUNTER — Other Ambulatory Visit: Payer: Self-pay

## 2019-05-27 DIAGNOSIS — D631 Anemia in chronic kidney disease: Secondary | ICD-10-CM | POA: Diagnosis not present

## 2019-05-27 DIAGNOSIS — Z992 Dependence on renal dialysis: Secondary | ICD-10-CM | POA: Diagnosis not present

## 2019-05-27 DIAGNOSIS — N2581 Secondary hyperparathyroidism of renal origin: Secondary | ICD-10-CM | POA: Diagnosis not present

## 2019-05-27 DIAGNOSIS — D509 Iron deficiency anemia, unspecified: Secondary | ICD-10-CM | POA: Diagnosis not present

## 2019-05-27 DIAGNOSIS — N186 End stage renal disease: Secondary | ICD-10-CM | POA: Diagnosis not present

## 2019-05-30 DIAGNOSIS — N186 End stage renal disease: Secondary | ICD-10-CM | POA: Diagnosis not present

## 2019-05-30 DIAGNOSIS — D509 Iron deficiency anemia, unspecified: Secondary | ICD-10-CM | POA: Diagnosis not present

## 2019-05-30 DIAGNOSIS — Z992 Dependence on renal dialysis: Secondary | ICD-10-CM | POA: Diagnosis not present

## 2019-05-30 DIAGNOSIS — D631 Anemia in chronic kidney disease: Secondary | ICD-10-CM | POA: Diagnosis not present

## 2019-05-30 DIAGNOSIS — N2581 Secondary hyperparathyroidism of renal origin: Secondary | ICD-10-CM | POA: Diagnosis not present

## 2019-05-31 DIAGNOSIS — Z20828 Contact with and (suspected) exposure to other viral communicable diseases: Secondary | ICD-10-CM | POA: Diagnosis not present

## 2019-06-01 DIAGNOSIS — D509 Iron deficiency anemia, unspecified: Secondary | ICD-10-CM | POA: Diagnosis not present

## 2019-06-01 DIAGNOSIS — N2581 Secondary hyperparathyroidism of renal origin: Secondary | ICD-10-CM | POA: Diagnosis not present

## 2019-06-01 DIAGNOSIS — N186 End stage renal disease: Secondary | ICD-10-CM | POA: Diagnosis not present

## 2019-06-01 DIAGNOSIS — Z992 Dependence on renal dialysis: Secondary | ICD-10-CM | POA: Diagnosis not present

## 2019-06-01 DIAGNOSIS — D631 Anemia in chronic kidney disease: Secondary | ICD-10-CM | POA: Diagnosis not present

## 2019-06-02 ENCOUNTER — Encounter: Payer: Self-pay | Admitting: Adult Health

## 2019-06-02 ENCOUNTER — Non-Acute Institutional Stay (SKILLED_NURSING_FACILITY): Payer: Medicare Other | Admitting: Adult Health

## 2019-06-02 DIAGNOSIS — IMO0002 Reserved for concepts with insufficient information to code with codable children: Secondary | ICD-10-CM

## 2019-06-02 DIAGNOSIS — E1169 Type 2 diabetes mellitus with other specified complication: Secondary | ICD-10-CM | POA: Diagnosis not present

## 2019-06-02 DIAGNOSIS — I951 Orthostatic hypotension: Secondary | ICD-10-CM

## 2019-06-02 DIAGNOSIS — E1122 Type 2 diabetes mellitus with diabetic chronic kidney disease: Secondary | ICD-10-CM

## 2019-06-02 DIAGNOSIS — D631 Anemia in chronic kidney disease: Secondary | ICD-10-CM

## 2019-06-02 DIAGNOSIS — R600 Localized edema: Secondary | ICD-10-CM | POA: Diagnosis not present

## 2019-06-02 DIAGNOSIS — E785 Hyperlipidemia, unspecified: Secondary | ICD-10-CM

## 2019-06-02 DIAGNOSIS — E038 Other specified hypothyroidism: Secondary | ICD-10-CM | POA: Diagnosis not present

## 2019-06-02 DIAGNOSIS — K219 Gastro-esophageal reflux disease without esophagitis: Secondary | ICD-10-CM

## 2019-06-02 DIAGNOSIS — Z992 Dependence on renal dialysis: Secondary | ICD-10-CM | POA: Diagnosis not present

## 2019-06-02 DIAGNOSIS — E1165 Type 2 diabetes mellitus with hyperglycemia: Secondary | ICD-10-CM

## 2019-06-02 DIAGNOSIS — N186 End stage renal disease: Secondary | ICD-10-CM

## 2019-06-02 NOTE — Progress Notes (Signed)
Location:   [place] Room Number: 78 D Place of Service:  SNF (31)    CODE STATUS: Full Code  No Known Allergies  Chief Complaint  Patient presents with   Annual Exam        HPI:  He is a 73 year old long term resident of this facility being seen for his annual exam. He has had some work done with his a/v fistula over the past year without hospitalizations. His weight has stayed stable over the past he has not had any falls in the past year. His cbg readings are elevated. He denies any uncontrolled pain; no changes in his appetite; no anxiety or depressive thoughts. He continues to be followed for his chronic illnesses including: diabetes; dyslipidemia; gerd.     Past Medical History:  Diagnosis Date   Abnormal CT scan, kidney 10/06/2011   Acute pyelonephritis 10/07/2011   Anemia    normocytic   Anxiety    mental retardation   Bladder wall thickening 10/06/2011   BPH (benign prostatic hypertrophy)    Diabetes mellitus    Dialysis patient ([place])    Tuesday, Thursday and Saturday,    DVT of leg (deep venous thrombosis) ([place]) 12/25/2016   Edema     history of lower extremity edema   GERD (gastroesophageal reflux disease)    Heme positive stool    Hydronephrosis    Hyperkalemia    Hyperlipidemia    Hypernatremia    Hypertension    Hypothyroidism    Impaired speech    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[place])    MR (mental retardation)    Muscle weakness    Obstructive uropathy    Perinephric abscess 10/07/2011   Poor historian poor historian   Protein calorie malnutrition ([place])    Pyelonephritis    Renal failure (ARF), acute on chronic (HCC)    Renal insufficiency    chronic history   Sepsis ([place])    Smoking    Uremia    Urinary retention    UTI (lower urinary tract infection) 10/06/2011    Past Surgical History:  Procedure Laterality Date   A/V FISTULAGRAM N/A 08/13/2018   Procedure: A/V  FISTULAGRAM - Right Upper;  Surgeon: Elam Dutch, MD;  Location: [facility];  Service: Cardiovascular;  Laterality: N/A;   A/V FISTULAGRAM N/A 11/22/2018   Procedure: A/V FISTULAGRAM - Right Upper;  Surgeon: Waynetta Sandy, MD;  Location: [facility];  Service: Cardiovascular;  Laterality: N/A;   AV FISTULA PLACEMENT Left 07/06/2015   Procedure:  INSERTION LEFT ARM ARTERIOVENOUS GORTEX GRAFT;  Surgeon: Angelia Mould, MD;  Location: [facility];  Service: Vascular;  Laterality: Left;   AV FISTULA PLACEMENT Right 02/26/2016   Procedure: ARTERIOVENOUS (AV) FISTULA CREATION ;  Surgeon: Angelia Mould, MD;  Location: [facility];  Service: Vascular;  Laterality: Right;   AV FISTULA PLACEMENT Right 11/25/2018   Procedure: INSERTION OF ARTERIOVENOUS (AV) ARTEGRAFT RIGHT UPPER ARM;  Surgeon: Waynetta Sandy, MD;  Location: [place];  Service: Vascular;  Laterality: Right;   [place] REMOVAL Left 10/09/2015   Procedure: REMOVAL OF ARTERIOVENOUS GORETEX GRAFT ([facility]) Evacuation of Lymphocele, Vein Patch angioplasty of brachial artery.;  Surgeon: Angelia Mould, MD;  Location: [place];  Service: Vascular;  Laterality: Left;   [place] Right 02/26/2016   Procedure: Right [place];  Surgeon: Angelia Mould, MD;  Location: [facility];  Service: Vascular;  Laterality:  Right;   CIRCUMCISION N/A 01/04/2014   Procedure: CIRCUMCISION ADULT (procedure #1);  Surgeon: Marissa Nestle, MD;  Location: [facility];  Service: Urology;  Laterality: N/A;   COLECTOMY N/A 05/04/2017   Procedure: TOTAL COLECTOMY;  Surgeon: Aviva Signs, MD;  Location: [facility];  Service: General;  Laterality: N/A;   COLONOSCOPY N/A 04/27/2017   Procedure: COLONOSCOPY;  Surgeon: Daneil Dolin, MD;  [facility];  Service: Endoscopy;  Laterality: N/A;  245   CYSTOSCOPY W/ RETROGRADES Bilateral 06/29/2015   Procedure: CYSTOSCOPY, DILATION OF URETHRAL STRICTURE  WITH BILATERAL RETROGRADE PYELOGRAM,SUPRAPUBIC TUBE CHANGE;  Surgeon: Festus Aloe, MD;  Location: [facility];  Service: Urology;  Laterality: Bilateral;   CYSTOSCOPY WITH URETHRAL DILATATION N/A 12/29/2013   Procedure: CYSTOSCOPY WITH URETHRAL DILATATION;  Surgeon: Marissa Nestle, MD;  [facility];  Service: Urology;  Laterality: N/A;   ESOPHAGOGASTRODUODENOSCOPY N/A 04/27/2017   Procedure: ESOPHAGOGASTRODUODENOSCOPY (EGD);  Surgeon: Daneil Dolin, MD;  [facility];  Service: Endoscopy;  Laterality: N/A;   INSERTION OF DIALYSIS CATHETER Right 11/25/2018   Procedure: INSERTION OF DIALYSIS CATHETER RIGHT INTERNAL JUGULAR;  Surgeon: Waynetta Sandy, MD;  Location: [facility] OR;  Service: Vascular;  Laterality: Right;   IR AV DIALY SHUNT INTRO NEEDLE/INTRACATH INITIAL W/PTA/IMG RIGHT Right 09/07/2018   IR REMOVAL TUN CV CATH W/O FL  01/12/2019   IR THROMBECTOMY AV FISTULA W/THROMBOLYSIS/PTA INC/SHUNT/IMG RIGHT Right 04/26/2018   IR US GUIDE VASC ACCESS RIGHT  04/26/2018   IR US GUIDE VASC ACCESS RIGHT  09/07/2018   ORIF FEMUR FRACTURE Right 11/22/2016   Procedure: OPEN REDUCTION INTERNAL FIXATION (ORIF) DISTAL FEMUR FRACTURE;  Surgeon: Rod Can, MD;  Location: [place];  Service: Orthopedics;  Laterality: Right;   PATCH ANGIOPLASTY Right 12/10/2017   Procedure: PATCH ANGIOPLASTY;  Surgeon: Angelia Mould, MD;  Location: [place];  Service: Vascular;  Laterality: Right;   PERIPHERAL VASCULAR BALLOON ANGIOPLASTY  08/13/2018   Procedure: PERIPHERAL VASCULAR BALLOON ANGIOPLASTY;  Surgeon: Elam Dutch, MD;  Location: [place] [facility];  Service: Cardiovascular;;  right AV fistula    PERIPHERAL VASCULAR BALLOON ANGIOPLASTY  11/22/2018   Procedure: PERIPHERAL VASCULAR BALLOON ANGIOPLASTY;  Surgeon: Waynetta Sandy, MD;  Location: [place] [facility];  Service: Cardiovascular;;  rt AV fistula   PERIPHERAL VASCULAR CATHETERIZATION N/A 10/08/2015   Procedure:  A/V Shuntogram;  Surgeon: Angelia Mould, MD;  Location: [place] [facility];  Service: Cardiovascular;  Laterality: N/A;   THROMBECTOMY W/ EMBOLECTOMY Right 12/10/2017   Procedure: THROMBECTOMY REVISION RIGHT ARM  ARTERIOVENOUS FISTULA;  Surgeon: Angelia Mould, MD;  Location: [facility] OR;  Service: Vascular;  Laterality: Right;   TRANSURETHRAL RESECTION OF PROSTATE N/A 01/04/2014   Procedure: TRANSURETHRAL RESECTION OF THE PROSTATE (TURP) (procedure #2);  Surgeon: Marissa Nestle, MD;  [facility];  Service: Urology;  Laterality: N/A;    Social History   Socioeconomic History   Marital status: Single    Spouse name: Not on file   Number of children: Not on file   Years of education: Not on file   Highest education level: Not on file  Occupational History   Not on file  Social Needs   Financial resource strain: Not hard at all   Food insecurity    Worry: Never true    Inability: Never true   Transportation needs    Medical: No    Non-medical: No  Tobacco Use   Smoking status: Never Smoker  Smokeless tobacco: Never Used  Substance and Sexual Activity   Alcohol use: No   Drug use: No   Sexual activity: Never  Lifestyle   Physical activity    Days per week: 0 days    Minutes per session: Not on file   Stress: Not at all  Relationships   Social connections    Talks on phone: More than three times a week    Gets together: More than three times a week    Attends religious service: Never    Active member of club or organization: No    Attends meetings of clubs or organizations: Never    Relationship status: Never married   Intimate partner violence    Fear of current or ex partner: No    Emotionally abused: No    Physically abused: No    Forced sexual activity: No  Other Topics Concern   Not on file  Social History Narrative   Lives at nursing home.   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    Family History  Problem Relation Age of Onset   Cancer Mother    Colon cancer Neg Hx     VITAL SIGNS BP 95/60    Pulse 96    Temp 98.4 F (36.9 C)    Resp 17    Ht 5\' 8"  (1.727 m)    Wt 209 lb 12.8 oz (95.2 kg)    BMI 31.90 kg/m   Patient's Medications  New Prescriptions   No medications on file  Previous Medications   ATORVASTATIN (LIPITOR) 80 MG TABLET    Take 80 mg by mouth every evening.   INSULIN ASPART (NOVOLOG FLEXPEN) 100 UNIT/ML FLEXPEN    Inject as Per sliding scale before meals:  < 60, call MD 200 - 300 = 8 units 301 - 400 = 10 units   INSULIN GLARGINE (BASAGLAR KWIKPEN) 100 UNIT/ML SOPN    Inject 24 Units into the skin at bedtime.    JANUVIA 25 MG TABLET    Take 25 mg by mouth daily.    LEVOTHYROXINE (SYNTHROID, LEVOTHROID) 88 MCG TABLET    Take 88 mcg by mouth daily before breakfast.   MIDODRINE (PROAMATINE) 10 MG TABLET    Take 10 mg by mouth every Monday, Wednesday, and Friday with hemodialysis. Send med with resident to dialysis.  Do not give at [facility], give to resident to take with hime   MIDODRINE (PROAMATINE) 5 MG TABLET    Take 5 mg by mouth. Send with resident on dialysis days.  Mon, Wed, Fri.  Take along with 10 mg to equal 15 mg   MULTIPLE VITAMIN (MULTIVITAMIN WITH MINERALS) TABS TABLET    Take 1 tablet by mouth every evening.    NON FORMULARY    Fluid Restriction - 1200 cc a day 500 cc for days, Evenings 500 cc, Night 200 cc   NON FORMULARY    Diet Type:  NAS, Cons CHO Diet   NUTRITIONAL SUPPLEMENTS (NEPRO/CARBSTEADY PO)    Take 1 Can by mouth 2 (two) times daily.   OMEGA-3 FATTY ACIDS (FISH OIL PO)    100-160-1,000 - take one capsule by mouth daily   OMEPRAZOLE (PRILOSEC) 40 MG CAPSULE    Take 40 mg by mouth at bedtime.    POLYETHYLENE GLYCOL (MIRALAX / GLYCOLAX) PACKET    Take 17 g by mouth as needed for mild constipation.    SEVELAMER CARBONATE (RENVELA) 800 MG TABLET    Take  800-1,600 mg by mouth See admin instructions. Take 1600 mg by mouth 3  times daily with meals and take 800 mg by mouth with snacks   TAMSULOSIN (FLOMAX) 0.4 MG CAPS CAPSULE    Take 0.4 mg by mouth every evening. Give 30 minutes after a meal   TORSEMIDE (DEMADEX) 10 MG TABLET    Take 5 tablets (50 mg total) by mouth daily.  Modified Medications   No medications on file  Discontinued Medications   No medications on file     SIGNIFICANT DIAGNOSTIC EXAMS  LABS REVIEWED PREVIOUS;  12-06-18: tsh 2.752 01-25-19: wbc 5.6; hgb 12.9 hct 40.8; mcv 110.3; plt 146; glucose 334; bun 25; creat 6.78; k+ 4.7; na++ 137; ca 9.0 liver normal albumin 4.0  Hgb a1c 6.9; chol 94 trig 439 LDL direct 16.3  TODAY.  05-10-19 hgb a1c 7.6     Review of Systems  Constitutional: Negative for malaise/fatigue.  Respiratory: Negative for cough and shortness of breath.   Cardiovascular: Negative for chest pain, palpitations and leg swelling.  Gastrointestinal: Negative for abdominal pain, constipation and heartburn.  Musculoskeletal: Negative for back pain, joint pain and myalgias.  Skin: Negative.   Neurological: Negative for dizziness.  Psychiatric/Behavioral: The patient is not nervous/anxious.     Physical Exam Constitutional:      General: He is not in acute distress.    Appearance: He is well-developed. He is obese. He is not diaphoretic.  HENT:     Right Ear: Ear canal normal.     Left Ear: Ear canal normal.     Nose: Nose normal.     Mouth/Throat:     Mouth: Mucous membranes are moist.     Pharynx: Oropharynx is clear.  Eyes:     Conjunctiva/sclera: Conjunctivae normal.  Neck:     Thyroid: No thyromegaly.  Cardiovascular:     Rate and Rhythm: Normal rate and regular rhythm.     Pulses: Normal pulses.     Heart sounds: Normal heart sounds.  Pulmonary:     Effort: Pulmonary effort is normal. No respiratory distress.     Breath sounds: Normal breath sounds.  Abdominal:     General: Bowel sounds are normal. There is no distension.     Palpations: Abdomen is soft.       Tenderness: There is no abdominal tenderness.     Comments: History of colectomy  Genitourinary:    Comments: History of turp Musculoskeletal:     Right lower leg: No edema.     Left lower leg: No edema.     Comments: Is able to move all extremities History of right femur ORIF      Lymphadenopathy:     Cervical: No cervical adenopathy.  Skin:    General: Skin is warm and dry.     Comments: Right upper extremity a/v fistula: + thrill + bruit         Neurological:     Mental Status: He is alert. Mental status is at baseline.  Psychiatric:        Mood and Affect: Mood normal.      ASSESSMENT/ PLAN:  TODAY   1. GERD without esophagitis: is stable will continue prilosec 40 mg daily  2. 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 Dyslipidemia associated with type 2  diabetes mellitus: is stable trig 439; will continue lipitor 80 mg daily and fish oil daily   4. End stage renal disease on hemodialysis due to type 2 diabetes mellitus: is stable will continue dialysis three days weekly is followed by nephrology will continue midodrine 15 mg with dialysis is on 1200 cc fluid restrictionl renveal 1600 mg with meals and 800 mg with snacks.   5. Other specified hypothyroidism: is stable 2.752 will continue synthroid 88 mcg daily   6. 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 Bilateral lower extremity edema will continue demadex 50 mg daily  8. Orthostatic hypotension: is stable b/p 95/60 will continue midodrein 15 mg with dialysis    Will check cbc; cmp lipids psa    Ok Edwards NP [facility]  Contact [phone] Monday through Friday 8am- 5pm  After hours call [phone]

## 2019-06-03 ENCOUNTER — Encounter: Payer: Self-pay | Admitting: Adult Health

## 2019-06-03 ENCOUNTER — Other Ambulatory Visit (HOSPITAL_COMMUNITY)
Admission: RE | Admit: 2019-06-03 | Discharge: 2019-06-03 | Disposition: A | Discharge status: Home / Self Care | Payer: Medicare Other | Source: Ambulatory Visit | Attending: Adult Health | Admitting: Adult Health

## 2019-06-03 ENCOUNTER — Non-Acute Institutional Stay (SKILLED_NURSING_FACILITY): Payer: Medicare Other | Admitting: Adult Health

## 2019-06-03 DIAGNOSIS — N186 End stage renal disease: Secondary | ICD-10-CM

## 2019-06-03 DIAGNOSIS — D509 Iron deficiency anemia, unspecified: Secondary | ICD-10-CM | POA: Diagnosis not present

## 2019-06-03 DIAGNOSIS — Z992 Dependence on renal dialysis: Secondary | ICD-10-CM | POA: Diagnosis not present

## 2019-06-03 DIAGNOSIS — I120 Hypertensive chronic kidney disease with stage 5 chronic kidney disease or end stage renal disease: Secondary | ICD-10-CM | POA: Insufficient documentation

## 2019-06-03 DIAGNOSIS — E1169 Type 2 diabetes mellitus with other specified complication: Secondary | ICD-10-CM

## 2019-06-03 DIAGNOSIS — D631 Anemia in chronic kidney disease: Secondary | ICD-10-CM | POA: Diagnosis not present

## 2019-06-03 DIAGNOSIS — E1165 Type 2 diabetes mellitus with hyperglycemia: Secondary | ICD-10-CM

## 2019-06-03 DIAGNOSIS — E1122 Type 2 diabetes mellitus with diabetic chronic kidney disease: Secondary | ICD-10-CM

## 2019-06-03 DIAGNOSIS — E785 Hyperlipidemia, unspecified: Secondary | ICD-10-CM

## 2019-06-03 DIAGNOSIS — IMO0002 Reserved for concepts with insufficient information to code with codable children: Secondary | ICD-10-CM

## 2019-06-03 DIAGNOSIS — N2581 Secondary hyperparathyroidism of renal origin: Secondary | ICD-10-CM | POA: Diagnosis not present

## 2019-06-03 LAB — LIPID PANEL
Cholesterol: 115 mg/dL (ref 0–200)
HDL: 24 mg/dL — ABNORMAL LOW (ref >40)
LDL Cholesterol: UNDETERMINED mg/dL (ref 0–99)
Total CHOL/HDL Ratio: 4.8 RATIO
Triglycerides: 531 mg/dL — ABNORMAL HIGH (ref <150)
VLDL: UNDETERMINED mg/dL (ref 0–40)

## 2019-06-03 LAB — LDL CHOLESTEROL, DIRECT: Direct LDL: 25.5 mg/dL (ref 0–99)

## 2019-06-03 LAB — CBC
HCT: 33.4 % — ABNORMAL LOW (ref 39.0–52.0)
Hemoglobin: 10.8 g/dL — ABNORMAL LOW (ref 13.0–17.0)
MCH: 34.5 pg — ABNORMAL HIGH (ref 26.0–34.0)
MCHC: 32.3 g/dL (ref 30.0–36.0)
MCV: 106.7 fL — ABNORMAL HIGH (ref 80.0–100.0)
Platelets: 132 10*3/uL — ABNORMAL LOW (ref 150–400)
RBC: 3.13 MIL/uL — ABNORMAL LOW (ref 4.22–5.81)
RDW: 15 % (ref 11.5–15.5)
WBC: 6.3 10*3/uL (ref 4.0–10.5)
nRBC: 0 % (ref 0.0–0.2)

## 2019-06-03 LAB — COMPREHENSIVE METABOLIC PANEL
ALT: 25 U/L (ref 0–44)
AST: 19 U/L (ref 15–41)
Albumin: 3.6 g/dL (ref 3.5–5.0)
Alkaline Phosphatase: 96 U/L (ref 38–126)
Anion gap: 9 (ref 5–15)
BUN: 30 mg/dL — ABNORMAL HIGH (ref 8–23)
CO2: 26 mmol/L (ref 22–32)
Calcium: 9.1 mg/dL (ref 8.9–10.3)
Chloride: 102 mmol/L (ref 98–111)
Creatinine, Ser: 8.46 mg/dL — ABNORMAL HIGH (ref 0.61–1.24)
GFR calc Af Amer: 7 mL/min — ABNORMAL LOW (ref >60)
GFR calc non Af Amer: 6 mL/min — ABNORMAL LOW (ref >60)
Glucose, Bld: 160 mg/dL — ABNORMAL HIGH (ref 70–99)
Potassium: 4.6 mmol/L (ref 3.5–5.1)
Sodium: 137 mmol/L (ref 135–145)
Total Bilirubin: 0.5 mg/dL (ref 0.3–1.2)
Total Protein: 6.9 g/dL (ref 6.5–8.1)

## 2019-06-03 LAB — PSA: Prostatic Specific Antigen: 2.73 ng/mL (ref 0.00–4.00)

## 2019-06-03 NOTE — Progress Notes (Signed)
Location:   [facility] Room Number: 112 D Place of Service:  SNF (31)   CODE STATUS: Full Code  No Known Allergies  Chief Complaint  Patient presents with  . Acute Visit    Lab follow up    HPI:    Past Medical History:  Diagnosis Date  . Abnormal CT scan, kidney 10/06/2011  . Acute pyelonephritis 10/07/2011  . Anemia    normocytic  . Anxiety    mental retardation  . Bladder wall thickening 10/06/2011  . BPH (benign prostatic hypertrophy)   . Diabetes mellitus   . Dialysis patient [facility])    Tuesday, Thursday and Saturday,   . DVT of leg (deep venous thrombosis) ([place]) 12/25/2016  . Edema     history of lower extremity edema  . GERD (gastroesophageal reflux disease)   . Heme positive stool   . Hydronephrosis   . Hyperkalemia   . Hyperlipidemia   . Hypernatremia   . Hypertension   . Hypothyroidism   . Impaired speech   . 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 MR (mental retardation)   . Muscle weakness   . Obstructive uropathy   . 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 Poor historian poor historian  . Protein calorie malnutrition ([place])   . Pyelonephritis   . Renal failure (ARF), acute on chronic (HCC)   . Renal insufficiency    chronic history  . Sepsis ([place])   . Smoking   . Uremia   . Urinary retention   . UTI (lower urinary tract infection) 10/06/2011    Past Surgical History:  Procedure Laterality Date  . A/V FISTULAGRAM N/A 08/13/2018   Procedure: A/V FISTULAGRAM - Right Upper;  Surgeon: Elam Dutch, MD;  Location: [place] CV LAB;  Service: Cardiovascular;  Laterality: N/A;  . A/V FISTULAGRAM N/A 11/22/2018   Procedure: A/V FISTULAGRAM - Right Upper;  Surgeon: Waynetta Sandy, MD;  Location: [place] CV LAB;  Service: Cardiovascular;  Laterality: N/A;  . AV FISTULA PLACEMENT Left 07/06/2015   Procedure:  INSERTION LEFT ARM ARTERIOVENOUS GORTEX GRAFT;  Surgeon: Angelia Mould, MD;  Location: [place];  Service: Vascular;  Laterality: Left;  . AV FISTULA PLACEMENT Right 02/26/2016   Procedure: ARTERIOVENOUS (AV) FISTULA CREATION ;  Surgeon: Angelia Mould, MD;  Location: [place];  Service: Vascular;  Laterality: Right;  . AV FISTULA PLACEMENT Right 11/25/2018   Procedure: INSERTION OF ARTERIOVENOUS (AV) ARTEGRAFT RIGHT UPPER ARM;  Surgeon: Waynetta Sandy, MD;  Location: [place];  Service: Vascular;  Laterality: Right;  . [place] REMOVAL Left 10/09/2015   Procedure: REMOVAL OF ARTERIOVENOUS GORETEX GRAFT ([place]) Evacuation of Lymphocele, Vein Patch angioplasty of brachial artery.;  Surgeon: Angelia Mould, MD;  Location: [place];  Service: Vascular;  Laterality: Left;  . BASCILIC VEIN TRANSPOSITION Right 02/26/2016   Procedure: Right BASCILIC VEIN TRANSPOSITION;  Surgeon: Angelia Mould, MD;  Location: [place];  Service: Vascular;  Laterality: Right;  . CIRCUMCISION N/A 01/04/2014   Procedure: CIRCUMCISION ADULT (procedure #1);  Surgeon: Marissa Nestle, MD;  Location[facility];  Service: Urology;  Laterality: N/A;  . COLECTOMY N/A 05/04/2017   Procedure: TOTAL COLECTOMY;  Surgeon: Aviva Signs, MD;  Location: [facility];  Service: General;  Laterality: N/A;  . COLONOSCOPY N/A 04/27/2017   Procedure: COLONOSCOPY;  Surgeon: Daneil Dolin, MD;  [facility];  Service: Endoscopy;  Laterality: N/A;  245  . CYSTOSCOPY W/  RETROGRADES Bilateral 06/29/2015   Procedure: CYSTOSCOPY, DILATION OF URETHRAL STRICTURE WITH BILATERAL RETROGRADE PYELOGRAM,SUPRAPUBIC TUBE CHANGE;  Surgeon: Festus Aloe, MD;  Location: [facility];  Service: Urology;  Laterality: Bilateral;  . CYSTOSCOPY WITH URETHRAL DILATATION N/A 12/29/2013   Procedure: CYSTOSCOPY WITH URETHRAL DILATATION;  Surgeon: Marissa Nestle, MD;  [facility];  Service: Urology;  Laterality: N/A;  . ESOPHAGOGASTRODUODENOSCOPY N/A 04/27/2017   Procedure: ESOPHAGOGASTRODUODENOSCOPY (EGD);  Surgeon: Daneil Dolin, MD;  [facility];  Service: Endoscopy;  Laterality: N/A;  . INSERTION OF DIALYSIS CATHETER Right 11/25/2018   Procedure: INSERTION OF DIALYSIS CATHETER RIGHT INTERNAL JUGULAR;  Surgeon: Waynetta Sandy, MD;  Location: [place];  Service: Vascular;  Laterality: Right;  . IR AV DIALY SHUNT INTRO [place] W/PTA/IMG RIGHT Right 09/07/2018  . IR REMOVAL TUN CV CATH W/O FL  01/12/2019  . IR THROMBECTOMY AV FISTULA W/THROMBOLYSIS/PTA INC/SHUNT/IMG RIGHT Right 04/26/2018  . IR US GUIDE VASC ACCESS RIGHT  04/26/2018  . IR US GUIDE VASC ACCESS RIGHT  09/07/2018  . ORIF FEMUR FRACTURE Right 11/22/2016   Procedure: OPEN REDUCTION INTERNAL FIXATION (ORIF) DISTAL FEMUR FRACTURE;  Surgeon: Rod Can, MD;  Location: [place];  Service: Orthopedics;  Laterality: Right;  . PATCH ANGIOPLASTY Right 12/10/2017   Procedure: PATCH ANGIOPLASTY;  Surgeon: Angelia Mould, MD;  Location: [facility];  Service: Vascular;  Laterality: Right;  . PERIPHERAL VASCULAR BALLOON ANGIOPLASTY  08/13/2018   Procedure: PERIPHERAL VASCULAR BALLOON ANGIOPLASTY;  Surgeon: Elam Dutch, MD;  Location: [place] CV LAB;  Service: Cardiovascular;;  right AV fistula   . PERIPHERAL VASCULAR BALLOON ANGIOPLASTY  11/22/2018   Procedure: PERIPHERAL VASCULAR BALLOON ANGIOPLASTY;  Surgeon: Waynetta Sandy, MD;  Location: [place] CV LAB;  Service: Cardiovascular;;  rt AV fistula  . PERIPHERAL VASCULAR CATHETERIZATION N/A 10/08/2015   Procedure: A/V Shuntogram;  Surgeon: Angelia Mould, MD;  Location: [place] CV LAB;  Service: Cardiovascular;  Laterality: N/A;  . THROMBECTOMY W/ EMBOLECTOMY Right 12/10/2017   Procedure: THROMBECTOMY REVISION RIGHT ARM  ARTERIOVENOUS FISTULA;  Surgeon: Angelia Mould, MD;  Location: [place];  Service: Vascular;  Laterality: Right;  . TRANSURETHRAL RESECTION OF PROSTATE N/A 01/04/2014   Procedure: TRANSURETHRAL RESECTION OF THE PROSTATE (TURP) (procedure #2);  Surgeon:  Marissa Nestle, MD;  [facility];  Service: Urology;  Laterality: N/A;    Social History   Socioeconomic History  . Marital status: Single    Spouse name: Not on file  . Number of children: Not on file  . Years of education: Not on file  . Highest education level: Not on file  Occupational History  . Not on file  Social Needs  . Financial resource strain: Not hard at all  . Food insecurity    Worry: Never true    Inability: Never true  . Transportation needs    Medical: No    Non-medical: No  Tobacco Use  . Smoking status: Never Smoker  . Smokeless tobacco: Never Used  Substance and Sexual Activity  . Alcohol use: No  . Drug use: No  . Sexual activity: Never  Lifestyle  . Physical activity    Days per week: 0 days    Minutes per session: Not on file  . Stress: Not at all  Relationships  . Social connections    Talks on phone: More than three times a week    Gets together: More than three times a week  Attends religious service: Never    Active member of club or organization: No    Attends meetings of clubs or organizations: Never    Relationship status: Never married  . Intimate partner violence    Fear of current or ex partner: No    Emotionally abused: No    Physically abused: No    Forced sexual activity: No  Other Topics Concern  . Not on file  Social History Narrative   Lives at nursing home.   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   Family History  Problem Relation Age of Onset  . Cancer Mother   . Colon cancer Neg Hx       VITAL SIGNS BP 95/60   Pulse 96   Temp 98.4 F (36.9 C)   Resp 17   Ht 5\' 8"  (1.727 m)   Wt 209 lb 12.8 oz (95.2 kg)   BMI 31.90 kg/m   Outpatient Encounter Medications as of 06/03/2019  Medication Sig  . atorvastatin (LIPITOR) 80 MG tablet Take 80 mg by mouth every evening.  . insulin aspart (NOVOLOG FLEXPEN) 100 UNIT/ML FlexPen Inject as Per sliding scale before meals:   < 60, call MD 200 - 300 = 8 units 301 - 400 = 10 units  . Insulin Glargine (BASAGLAR KWIKPEN) 100 UNIT/ML SOPN Inject 27 Units into the skin at bedtime.   Marland Kitchen JANUVIA 25 MG tablet Take 25 mg by mouth daily.   Marland Kitchen levothyroxine (SYNTHROID, LEVOTHROID) 88 MCG tablet Take 88 mcg by mouth daily before breakfast.  . midodrine (PROAMATINE) 10 MG tablet Take 10 mg by mouth every Monday, Wednesday, and Friday with hemodialysis. Send med with resident to dialysis.  Do not give at [facility], give to resident to take with hime  . midodrine (PROAMATINE) 5 MG tablet Take 5 mg by mouth. Send with resident on dialysis days.  Mon, Wed, Fri.  Take along with 10 mg to equal 15 mg  . Multiple Vitamin (MULTIVITAMIN WITH MINERALS) TABS tablet Take 1 tablet by mouth every evening.   . NON FORMULARY Fluid Restriction - 1200 cc a day 500 cc for days, Evenings 500 cc, Night 200 cc  . NON FORMULARY Diet Type:  NAS, Cons CHO Diet  . Nutritional Supplements (NEPRO/CARBSTEADY PO) Take 1 Can by mouth 2 (two) times daily.  . Omega-3 Fatty Acids (FISH OIL PO) 100-160-1,000 - take one capsule by mouth daily  . omeprazole (PRILOSEC) 40 MG capsule Take 40 mg by mouth at bedtime.   . polyethylene glycol (MIRALAX / GLYCOLAX) packet Take 17 g by mouth as needed for mild constipation.   . sevelamer carbonate (RENVELA) 800 MG tablet Take 800-1,600 mg by mouth See admin instructions. Take 1600 mg by mouth 3 times daily with meals and take 800 mg by mouth with snacks  . tamsulosin (FLOMAX) 0.4 MG CAPS capsule Take 0.4 mg by mouth every evening. Give 30 minutes after a meal  . torsemide (DEMADEX) 10 MG tablet Take 5 tablets (50 mg total) by mouth daily.   No facility-administered encounter medications on file as of 06/03/2019.      SIGNIFICANT DIAGNOSTIC EXAMS   LABS REVIEWED PREVIOUS;  12-06-18: tsh 2.752 01-25-19: wbc 5.6; hgb 12.9 hct 40.8; mcv 110.3; plt 146; glucose 334; bun 25; creat 6.78; k+ 4.7; na++ 137; ca 9.0 liver normal  albumin 4.0  Hgb a1c 6.9; chol 94 trig 439 LDL direct 16.3 05-10-19 hgb a1c 7.6    TODAY;  06-03-19: wbc 6.3; hgb 10.8; hct 33.4; mcv 106.7; plt 132; glucose 160; bun 30; creat 8.46; k+ 4.6; na++ 137; ca 9.1; liver normal albumin 3.6; chol 115; LDL 25.5; trig 531; hdl 24; PSA 2.73   Review of Systems  Constitutional: Negative for malaise/fatigue.  Respiratory: Negative for cough and shortness of breath.   Cardiovascular: Negative for chest pain, palpitations and leg swelling.  Gastrointestinal: Negative for abdominal pain, constipation and heartburn.  Musculoskeletal: Negative for back pain, joint pain and myalgias.  Skin: Negative.   Neurological: Negative for dizziness.  Psychiatric/Behavioral: The patient is not nervous/anxious.      Physical Exam Constitutional:      General: He is not in acute distress.    Appearance: He is well-developed. He is not diaphoretic.  Neck:     Musculoskeletal: Neck supple.     Thyroid: No thyromegaly.  Cardiovascular:     Rate and Rhythm: Normal rate and regular rhythm.     Pulses: Normal pulses.     Heart sounds: Normal heart sounds.  Pulmonary:     Effort: Pulmonary effort is normal. No respiratory distress.     Breath sounds: Normal breath sounds.  Abdominal:     General: Bowel sounds are normal. There is no distension.     Palpations: Abdomen is soft.     Tenderness: There is no abdominal tenderness.     Comments: History of colectomy   Genitourinary:    Comments: History of turp Musculoskeletal:     Right lower leg: No edema.     Left lower leg: No edema.     Comments: Is able to move all extremities History of right femur ORIF       Lymphadenopathy:     Cervical: No cervical adenopathy.  Skin:    General: Skin is warm and dry.     Comments: Right upper extremity a/v fistula: + thrill + bruit          Neurological:     Mental Status: He is alert. Mental status is at baseline.  Psychiatric:        Mood and Affect: Mood normal.        ASSESSMENT/ PLAN:  TODAY;   1. 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 Dyslipidemia associated with type 2 diabetes mellitus  Is worse Will stop fish oil; not effective will begin lovaza 2 gm daily and will monitor his status.      MD is aware of resident's narcotic use and is in agreement with current plan of care. We will attempt to wean resident as apropriate   Ok Edwards NP [facility]  Contact [phone] Monday through Friday 8am- 5pm  After hours call [phone]

## 2019-06-06 DIAGNOSIS — N186 End stage renal disease: Secondary | ICD-10-CM | POA: Diagnosis not present

## 2019-06-06 DIAGNOSIS — D509 Iron deficiency anemia, unspecified: Secondary | ICD-10-CM | POA: Diagnosis not present

## 2019-06-06 DIAGNOSIS — N2581 Secondary hyperparathyroidism of renal origin: Secondary | ICD-10-CM | POA: Diagnosis not present

## 2019-06-06 DIAGNOSIS — D631 Anemia in chronic kidney disease: Secondary | ICD-10-CM | POA: Diagnosis not present

## 2019-06-06 DIAGNOSIS — Z992 Dependence on renal dialysis: Secondary | ICD-10-CM | POA: Diagnosis not present

## 2019-06-08 DIAGNOSIS — N2581 Secondary hyperparathyroidism of renal origin: Secondary | ICD-10-CM | POA: Diagnosis not present

## 2019-06-08 DIAGNOSIS — N186 End stage renal disease: Secondary | ICD-10-CM | POA: Diagnosis not present

## 2019-06-08 DIAGNOSIS — D509 Iron deficiency anemia, unspecified: Secondary | ICD-10-CM | POA: Diagnosis not present

## 2019-06-08 DIAGNOSIS — Z992 Dependence on renal dialysis: Secondary | ICD-10-CM | POA: Diagnosis not present

## 2019-06-08 DIAGNOSIS — D631 Anemia in chronic kidney disease: Secondary | ICD-10-CM | POA: Diagnosis not present

## 2019-06-10 DIAGNOSIS — Z992 Dependence on renal dialysis: Secondary | ICD-10-CM | POA: Diagnosis not present

## 2019-06-10 DIAGNOSIS — N2581 Secondary hyperparathyroidism of renal origin: Secondary | ICD-10-CM | POA: Diagnosis not present

## 2019-06-10 DIAGNOSIS — N186 End stage renal disease: Secondary | ICD-10-CM | POA: Diagnosis not present

## 2019-06-10 DIAGNOSIS — D631 Anemia in chronic kidney disease: Secondary | ICD-10-CM | POA: Diagnosis not present

## 2019-06-10 DIAGNOSIS — D509 Iron deficiency anemia, unspecified: Secondary | ICD-10-CM | POA: Diagnosis not present

## 2019-06-13 DIAGNOSIS — Z992 Dependence on renal dialysis: Secondary | ICD-10-CM | POA: Diagnosis not present

## 2019-06-13 DIAGNOSIS — D509 Iron deficiency anemia, unspecified: Secondary | ICD-10-CM | POA: Diagnosis not present

## 2019-06-13 DIAGNOSIS — N2581 Secondary hyperparathyroidism of renal origin: Secondary | ICD-10-CM | POA: Diagnosis not present

## 2019-06-13 DIAGNOSIS — N186 End stage renal disease: Secondary | ICD-10-CM | POA: Diagnosis not present

## 2019-06-13 DIAGNOSIS — D631 Anemia in chronic kidney disease: Secondary | ICD-10-CM | POA: Diagnosis not present

## 2019-06-15 DIAGNOSIS — N2581 Secondary hyperparathyroidism of renal origin: Secondary | ICD-10-CM | POA: Diagnosis not present

## 2019-06-15 DIAGNOSIS — D509 Iron deficiency anemia, unspecified: Secondary | ICD-10-CM | POA: Diagnosis not present

## 2019-06-15 DIAGNOSIS — N186 End stage renal disease: Secondary | ICD-10-CM | POA: Diagnosis not present

## 2019-06-15 DIAGNOSIS — Z992 Dependence on renal dialysis: Secondary | ICD-10-CM | POA: Diagnosis not present

## 2019-06-15 DIAGNOSIS — D631 Anemia in chronic kidney disease: Secondary | ICD-10-CM | POA: Diagnosis not present

## 2019-06-17 DIAGNOSIS — N2581 Secondary hyperparathyroidism of renal origin: Secondary | ICD-10-CM | POA: Diagnosis not present

## 2019-06-17 DIAGNOSIS — Z992 Dependence on renal dialysis: Secondary | ICD-10-CM | POA: Diagnosis not present

## 2019-06-17 DIAGNOSIS — D509 Iron deficiency anemia, unspecified: Secondary | ICD-10-CM | POA: Diagnosis not present

## 2019-06-17 DIAGNOSIS — D631 Anemia in chronic kidney disease: Secondary | ICD-10-CM | POA: Diagnosis not present

## 2019-06-17 DIAGNOSIS — N186 End stage renal disease: Secondary | ICD-10-CM | POA: Diagnosis not present

## 2019-06-20 DIAGNOSIS — N2581 Secondary hyperparathyroidism of renal origin: Secondary | ICD-10-CM | POA: Diagnosis not present

## 2019-06-20 DIAGNOSIS — Z992 Dependence on renal dialysis: Secondary | ICD-10-CM | POA: Diagnosis not present

## 2019-06-20 DIAGNOSIS — D509 Iron deficiency anemia, unspecified: Secondary | ICD-10-CM | POA: Diagnosis not present

## 2019-06-20 DIAGNOSIS — N186 End stage renal disease: Secondary | ICD-10-CM | POA: Diagnosis not present

## 2019-06-20 DIAGNOSIS — D631 Anemia in chronic kidney disease: Secondary | ICD-10-CM | POA: Diagnosis not present

## 2019-06-22 DIAGNOSIS — Z992 Dependence on renal dialysis: Secondary | ICD-10-CM | POA: Diagnosis not present

## 2019-06-22 DIAGNOSIS — D631 Anemia in chronic kidney disease: Secondary | ICD-10-CM | POA: Diagnosis not present

## 2019-06-22 DIAGNOSIS — N186 End stage renal disease: Secondary | ICD-10-CM | POA: Diagnosis not present

## 2019-06-22 DIAGNOSIS — D509 Iron deficiency anemia, unspecified: Secondary | ICD-10-CM | POA: Diagnosis not present

## 2019-06-22 DIAGNOSIS — N2581 Secondary hyperparathyroidism of renal origin: Secondary | ICD-10-CM | POA: Diagnosis not present

## 2019-06-24 DIAGNOSIS — D509 Iron deficiency anemia, unspecified: Secondary | ICD-10-CM | POA: Diagnosis not present

## 2019-06-24 DIAGNOSIS — N2581 Secondary hyperparathyroidism of renal origin: Secondary | ICD-10-CM | POA: Diagnosis not present

## 2019-06-24 DIAGNOSIS — N186 End stage renal disease: Secondary | ICD-10-CM | POA: Diagnosis not present

## 2019-06-24 DIAGNOSIS — D631 Anemia in chronic kidney disease: Secondary | ICD-10-CM | POA: Diagnosis not present

## 2019-06-24 DIAGNOSIS — Z992 Dependence on renal dialysis: Secondary | ICD-10-CM | POA: Diagnosis not present

## 2019-06-27 DIAGNOSIS — D509 Iron deficiency anemia, unspecified: Secondary | ICD-10-CM | POA: Diagnosis not present

## 2019-06-27 DIAGNOSIS — D631 Anemia in chronic kidney disease: Secondary | ICD-10-CM | POA: Diagnosis not present

## 2019-06-27 DIAGNOSIS — N2581 Secondary hyperparathyroidism of renal origin: Secondary | ICD-10-CM | POA: Diagnosis not present

## 2019-06-27 DIAGNOSIS — N186 End stage renal disease: Secondary | ICD-10-CM | POA: Diagnosis not present

## 2019-06-27 DIAGNOSIS — Z992 Dependence on renal dialysis: Secondary | ICD-10-CM | POA: Diagnosis not present

## 2019-06-29 DIAGNOSIS — N2581 Secondary hyperparathyroidism of renal origin: Secondary | ICD-10-CM | POA: Diagnosis not present

## 2019-06-29 DIAGNOSIS — D509 Iron deficiency anemia, unspecified: Secondary | ICD-10-CM | POA: Diagnosis not present

## 2019-06-29 DIAGNOSIS — N186 End stage renal disease: Secondary | ICD-10-CM | POA: Diagnosis not present

## 2019-06-29 DIAGNOSIS — Z992 Dependence on renal dialysis: Secondary | ICD-10-CM | POA: Diagnosis not present

## 2019-06-29 DIAGNOSIS — D631 Anemia in chronic kidney disease: Secondary | ICD-10-CM | POA: Diagnosis not present

## 2019-07-01 DIAGNOSIS — Z992 Dependence on renal dialysis: Secondary | ICD-10-CM | POA: Diagnosis not present

## 2019-07-01 DIAGNOSIS — D509 Iron deficiency anemia, unspecified: Secondary | ICD-10-CM | POA: Diagnosis not present

## 2019-07-01 DIAGNOSIS — N2581 Secondary hyperparathyroidism of renal origin: Secondary | ICD-10-CM | POA: Diagnosis not present

## 2019-07-01 DIAGNOSIS — N186 End stage renal disease: Secondary | ICD-10-CM | POA: Diagnosis not present

## 2019-07-01 DIAGNOSIS — D631 Anemia in chronic kidney disease: Secondary | ICD-10-CM | POA: Diagnosis not present

## 2019-07-04 DIAGNOSIS — D509 Iron deficiency anemia, unspecified: Secondary | ICD-10-CM | POA: Diagnosis not present

## 2019-07-04 DIAGNOSIS — Z992 Dependence on renal dialysis: Secondary | ICD-10-CM | POA: Diagnosis not present

## 2019-07-04 DIAGNOSIS — D631 Anemia in chronic kidney disease: Secondary | ICD-10-CM | POA: Diagnosis not present

## 2019-07-04 DIAGNOSIS — N2581 Secondary hyperparathyroidism of renal origin: Secondary | ICD-10-CM | POA: Diagnosis not present

## 2019-07-04 DIAGNOSIS — N186 End stage renal disease: Secondary | ICD-10-CM | POA: Diagnosis not present

## 2019-07-06 ENCOUNTER — Encounter: Payer: Self-pay | Admitting: Internal Medicine

## 2019-07-06 ENCOUNTER — Non-Acute Institutional Stay (SKILLED_NURSING_FACILITY): Payer: Medicare Other | Admitting: Internal Medicine

## 2019-07-06 DIAGNOSIS — E785 Hyperlipidemia, unspecified: Secondary | ICD-10-CM

## 2019-07-06 DIAGNOSIS — Z992 Dependence on renal dialysis: Secondary | ICD-10-CM | POA: Diagnosis not present

## 2019-07-06 DIAGNOSIS — E1169 Type 2 diabetes mellitus with other specified complication: Secondary | ICD-10-CM | POA: Diagnosis not present

## 2019-07-06 DIAGNOSIS — I951 Orthostatic hypotension: Secondary | ICD-10-CM

## 2019-07-06 DIAGNOSIS — D631 Anemia in chronic kidney disease: Secondary | ICD-10-CM | POA: Diagnosis not present

## 2019-07-06 DIAGNOSIS — N2581 Secondary hyperparathyroidism of renal origin: Secondary | ICD-10-CM | POA: Diagnosis not present

## 2019-07-06 DIAGNOSIS — K219 Gastro-esophageal reflux disease without esophagitis: Secondary | ICD-10-CM | POA: Diagnosis not present

## 2019-07-06 DIAGNOSIS — D509 Iron deficiency anemia, unspecified: Secondary | ICD-10-CM | POA: Diagnosis not present

## 2019-07-06 DIAGNOSIS — N186 End stage renal disease: Secondary | ICD-10-CM | POA: Diagnosis not present

## 2019-07-06 NOTE — Progress Notes (Signed)
Location:  [facility] Room Number: 112-D Place of Service:  SNF (31)  Hennie Duos, MD  Patient Care Team: Hennie Duos, MD as PCP - General (Internal Medicine) Cassandria Anger, MD as Consulting Physician (Endocrinology) Fran Lowes, MD as Consulting Physician (Nephrology) Gala Romney Cristopher Estimable, MD as Consulting Physician (Gastroenterology) [facility] [place] ([place]) Nyoka Cowden Phylis Bougie, NP as Nurse Practitioner (Geriatric Medicine)  Extended Emergency Contact Information Primary Emergency Contact: Betsey Amen Address: DSS  [place] of [place] Phone: [phone] Work Phone: [phone] Relation: Legal Guardian Secondary Emergency Contact: Kramm,Richard Address: [address]          [place],  [address] [place] Phone: [phone] Relation: Brother    Allergies: Patient has no known allergies.  Chief Complaint  Patient presents with  . Medical Management of Chronic Issues    Routine [facility] visit    HPI: Patient is a 73 y.o. male who is being seen for routine issues of orthostatic hypotension, GERD, and hyperlipidemia.  Past Medical History:  Diagnosis Date  . Abnormal CT scan, kidney 10/06/2011  . Acute pyelonephritis 10/07/2011  . Anemia    normocytic  . Anxiety    mental retardation  . Bladder wall thickening 10/06/2011  . BPH (benign prostatic hypertrophy)   . Diabetes mellitus   . Dialysis patient [facility])    Tuesday, Thursday and Saturday,   . DVT of leg (deep venous thrombosis) ([place]) 12/25/2016  . Edema     history of lower extremity edema  . GERD (gastroesophageal reflux disease)   . Heme positive stool   . Hydronephrosis   . Hyperkalemia   . Hyperlipidemia   . Hypernatremia   . Hypertension   . Hypothyroidism   . Impaired speech   . 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[place])   . MR (mental retardation)   . Muscle weakness   .  Obstructive uropathy   . 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 Poor historian poor historian  . Protein calorie malnutrition ([place])   . Pyelonephritis   . Renal failure (ARF), acute on chronic (HCC)   . Renal insufficiency    chronic history  . Sepsis ([place])   . Smoking   . Uremia   . Urinary retention   . UTI (lower urinary tract infection) 10/06/2011    Past Surgical History:  Procedure Laterality Date  . A/V FISTULAGRAM N/A 08/13/2018   Procedure: A/V FISTULAGRAM - Right Upper;  Surgeon: Elam Dutch, MD;  Location: [place] CV LAB;  Service: Cardiovascular;  Laterality: N/A;  . A/V FISTULAGRAM N/A 11/22/2018   Procedure: A/V FISTULAGRAM - Right Upper;  Surgeon: Waynetta Sandy, MD;  Location: [place] CV LAB;  Service: Cardiovascular;  Laterality: N/A;  . AV FISTULA PLACEMENT Left 07/06/2015   Procedure:  INSERTION LEFT ARM ARTERIOVENOUS GORTEX GRAFT;  Surgeon: Angelia Mould, MD;  Location: [place];  Service: Vascular;  Laterality: Left;  . AV FISTULA PLACEMENT Right 02/26/2016   Procedure: ARTERIOVENOUS (AV) FISTULA CREATION ;  Surgeon: Angelia Mould, MD;  Location: [place];  Service: Vascular;  Laterality: Right;  . AV FISTULA PLACEMENT Right 11/25/2018   Procedure: INSERTION OF ARTERIOVENOUS (AV) ARTEGRAFT RIGHT UPPER ARM;  Surgeon: Waynetta Sandy, MD;  Location: [place];  Service: Vascular;  Laterality: Right;  . [place] REMOVAL Left 10/09/2015   Procedure: REMOVAL OF ARTERIOVENOUS GORETEX GRAFT ([place]) Evacuation of Lymphocele, Vein Patch angioplasty of brachial artery.;  Surgeon: Angelia Mould, MD;  Location: [place];  Service: Vascular;  Laterality: Left;  . BASCILIC VEIN TRANSPOSITION Right 02/26/2016   Procedure: Right BASCILIC VEIN TRANSPOSITION;  Surgeon: Angelia Mould, MD;  Location: [place];  Service: Vascular;  Laterality: Right;  . CIRCUMCISION N/A 01/04/2014   Procedure: CIRCUMCISION ADULT (procedure #1);  Surgeon: Marissa Nestle, MD;  Location: [facility];  Service: Urology;  Laterality: N/A;  . COLECTOMY N/A 05/04/2017   Procedure: TOTAL COLECTOMY;  Surgeon: Aviva Signs, MD;  Location: [facility];  Service: General;  Laterality: N/A;  . COLONOSCOPY N/A 04/27/2017   Procedure: COLONOSCOPY;  Surgeon: Daneil Dolin, MD;  [facility];  Service: Endoscopy;  Laterality: N/A;  245  . CYSTOSCOPY W/ RETROGRADES Bilateral 06/29/2015   Procedure: CYSTOSCOPY, DILATION OF URETHRAL STRICTURE WITH BILATERAL RETROGRADE PYELOGRAM,SUPRAPUBIC TUBE CHANGE;  Surgeon: Festus Aloe, MD;  Location: [facility];  Service: Urology;  Laterality: Bilateral;  . CYSTOSCOPY WITH URETHRAL DILATATION N/A 12/29/2013   Procedure: CYSTOSCOPY WITH URETHRAL DILATATION;  Surgeon: Marissa Nestle, MD;  Location: [facility];  Service: Urology;  Laterality: N/A;  . ESOPHAGOGASTRODUODENOSCOPY N/A 04/27/2017   Procedure: ESOPHAGOGASTRODUODENOSCOPY (EGD);  Surgeon: Daneil Dolin, MD;  [facility];  Service: Endoscopy;  Laterality: N/A;  . INSERTION OF DIALYSIS CATHETER Right 11/25/2018   Procedure: INSERTION OF DIALYSIS CATHETER RIGHT INTERNAL JUGULAR;  Surgeon: Waynetta Sandy, MD;  Location: [place];  Service: Vascular;  Laterality: Right;  . IR AV DIALY SHUNT INTRO [place] W/PTA/IMG RIGHT Right 09/07/2018  . IR REMOVAL TUN CV CATH W/O FL  01/12/2019  . IR THROMBECTOMY AV FISTULA W/THROMBOLYSIS/PTA INC/SHUNT/IMG RIGHT Right 04/26/2018  . IR US GUIDE VASC ACCESS RIGHT  04/26/2018  . IR US GUIDE VASC ACCESS RIGHT  09/07/2018  . ORIF FEMUR FRACTURE Right 11/22/2016   Procedure: OPEN REDUCTION INTERNAL FIXATION (ORIF) DISTAL FEMUR FRACTURE;  Surgeon: Rod Can, MD;  Location: [place];  Service: Orthopedics;  Laterality: Right;  . PATCH ANGIOPLASTY Right 12/10/2017   Procedure: PATCH ANGIOPLASTY;  Surgeon: Angelia Mould, MD;  [facility];  Service: Vascular;  Laterality: Right;  . PERIPHERAL VASCULAR BALLOON  ANGIOPLASTY  08/13/2018   Procedure: PERIPHERAL VASCULAR BALLOON ANGIOPLASTY;  Surgeon: Elam Dutch, MD;  Location: [place] CV LAB;  Service: Cardiovascular;;  right AV fistula   . PERIPHERAL VASCULAR BALLOON ANGIOPLASTY  11/22/2018   Procedure: PERIPHERAL VASCULAR BALLOON ANGIOPLASTY;  Surgeon: Waynetta Sandy, MD;  Location: [place] CV LAB;  Service: Cardiovascular;;  rt AV fistula  . PERIPHERAL VASCULAR CATHETERIZATION N/A 10/08/2015   Procedure: A/V Shuntogram;  Surgeon: Angelia Mould, MD;  Location: [place] [facility] LAB;  Service: Cardiovascular;  Laterality: N/A;  . THROMBECTOMY W/ EMBOLECTOMY Right 12/10/2017   Procedure: THROMBECTOMY REVISION RIGHT ARM  ARTERIOVENOUS FISTULA;  Surgeon: Angelia Mould, MD;  Location: [place];  Service: Vascular;  Laterality: Right;  . TRANSURETHRAL RESECTION OF PROSTATE N/A 01/04/2014   Procedure: TRANSURETHRAL RESECTION OF THE PROSTATE (TURP) (procedure #2);  Surgeon: Marissa Nestle, MD;  [facility]: [facility];  Service: Urology;  Laterality: N/A;    Allergies as of 07/06/2019   No Known Allergies     Medication List    Notice   This visit is during an admission. Changes to the med list made in this visit will be reflected in the After Visit Summary of the admission.     No orders of the defined types were placed  in this encounter.   Immunization History  Administered Date(s) Administered  . Influenza,inj,Quad PF,6+ Mos 12/26/2013, 07/09/2015  . Influenza-Unspecified 07/31/2017, 07/29/2018  . PPD Test 03/07/2015  . Pneumococcal Conjugate-13 07/31/2017  . Pneumococcal Polysaccharide-23 03/04/2015  . Pneumococcal-Unspecified 12/03/2016  . Tdap 08/22/2017    Social History   Tobacco Use  . Smoking status: Never Smoker  . Smokeless tobacco: Never Used  Substance Use Topics  . Alcohol use: No    Review of Systems  DATA OBTAINED: from patient-very limited; nursing-no acute concerns GENERAL:  no fevers,  fatigue, appetite changes SKIN: No itching, rash HEENT: No complaint RESPIRATORY: No cough, wheezing, SOB CARDIAC: No chest pain, palpitations, lower extremity edema  GI: No abdominal pain, No N/V/D or constipation, No heartburn or reflux  GU: No dysuria, frequency or urgency, or incontinence  MUSCULOSKELETAL: No unrelieved bone/joint pain NEUROLOGIC: No headache, dizziness  PSYCHIATRIC: No overt anxiety or sadness  Vitals:   07/06/19 1520  BP: (!) 80/54  Pulse: 88  Resp: 20  Temp: 98.1 F (36.7 C)  SpO2: 100%   Body mass index is 31.23 kg/m. Physical Exam  GENERAL APPEARANCE: Alert, minimally conversant, No acute distress  SKIN: No diaphoresis rash HEENT: Unremarkable RESPIRATORY: Breathing is even, unlabored. Lung sounds are clear   CARDIOVASCULAR: Heart RRR no murmurs, rubs or gallops. No peripheral edema  GASTROINTESTINAL: Abdomen is soft, non-tender, not distended w/ normal bowel sounds.  GENITOURINARY: Bladder non tender, not distended  MUSCULOSKELETAL: No abnormal joints or musculature NEUROLOGIC: Cranial nerves 2-12 grossly intact. Moves all extremities; some cognitive delay PSYCHIATRIC: Mood and affect appropriate to situation, no behavioral issues  Patient Active Problem List   Diagnosis Date Noted  . Orthostatic hypotension 02/17/2019  . GERD without esophagitis 02/17/2019  . Dyslipidemia associated with type 2 diabetes mellitus ([place]) 02/17/2019  . Bilateral lower extremity edema 02/17/2019  . Uncontrolled type 2 diabetes mellitus with end-stage renal disease ([place]) 02/13/2019  . End stage renal disease on dialysis due to type 2 diabetes mellitus ([place]) 02/13/2019  . Hypertriglyceridemia 01/25/2019  . Gastroenteritis   . Hypothyroidism 01/19/2018  . Enteritis 01/19/2018  . S/P colectomy 08/31/2017  . Malignant neoplasm of colon ([place]) 06/25/2017  . Colitis 05/17/2017  . Dysplastic polyp of colon   . ESRD (end stage renal disease) ([place]) 05/01/2017  .  Colonoscopy causing post-procedural bleeding   . Lower GI bleed   . Anemia in chronic kidney disease 08/23/2016  . Dependence on renal dialysis ([place]) 08/23/2016  . Type 2 diabetes mellitus with unspecified diabetic retinopathy without macular edema ([place]) 08/23/2016  . Hydronephrosis, bilateral 03/10/2016  . Essential (primary) hypertension 07/08/2015  . Hyperlipidemia 07/08/2015  . Obstructive and reflux uropathy, unspecified 04/17/2015  . Bladder neck contracture   . Benign prostatic hyperplasia with urinary retention 03/03/2015  . Intellectual disability 10/10/2011  . Acute pyelonephritis 10/07/2011  . Iron deficiency anemia 10/02/2011    CMP     Component Value Date/Time   NA 137 06/03/2019 0325   K 4.6 06/03/2019 0325   CL 102 06/03/2019 0325   CO2 26 06/03/2019 0325   GLUCOSE 160 (H) 06/03/2019 0325   BUN 30 (H) 06/03/2019 0325   CREATININE 8.46 (H) 06/03/2019 0325   CALCIUM 9.1 06/03/2019 0325   CALCIUM 8.3 (L) 03/03/2015 0622   PROT 6.9 06/03/2019 0325   ALBUMIN 3.6 06/03/2019 0325   AST 19 06/03/2019 0325   ALT 25 06/03/2019 0325   ALKPHOS 96 06/03/2019 0325   BILITOT 0.5 06/03/2019 0325  GFRNONAA 6 (L) 06/03/2019 0325   GFRAA 7 (L) 06/03/2019 0325   Recent Labs    11/22/18 1048 11/25/18 0929 01/25/19 0700 06/03/19 0325  NA 138 142 137 137  K 3.9 4.1 4.7 4.6  CL 99  --  97* 102  CO2 26  --  28 26  GLUCOSE 152* 116* 334* 160*  BUN 40*  --  25* 30*  CREATININE 9.66*  --  6.78* 8.46*  CALCIUM 9.1  --  9.0 9.1   Recent Labs    01/25/19 0700 06/03/19 0325  AST 21 19  ALT 27 25  ALKPHOS 125 96  BILITOT 0.5 0.5  PROT 8.0 6.9  ALBUMIN 4.0 3.6   Recent Labs    10/23/18 0504 11/22/18 1047 11/25/18 0929 01/25/19 0700 06/03/19 0325  WBC 5.4 5.6  --  5.6 6.3  NEUTROABS 2.3  --   --   --   --   HGB 11.5* 11.0* 11.2* 12.9* 10.8*  HCT 37.7* 34.9* 33.0* 40.8 33.4*  MCV 109.0* 103.9*  --  110.3* 106.7*  PLT 145* 102*  --  146* 132*   Recent Labs     10/23/18 0504 01/25/19 0700 06/03/19 0325  CHOL 86 94 115  LDLCALC 20 UNABLE TO CALCULATE IF TRIGLYCERIDE OVER 400 mg/dL UNABLE TO CALCULATE IF TRIGLYCERIDE OVER 400 mg/dL  TRIG 197* 439* 531*   No results found for: [facility] Lab Results  Component Value Date   TSH 2.752 12/06/2018   Lab Results  Component Value Date   HGBA1C 7.6 (H) 05/10/2019   Lab Results  Component Value Date   CHOL 115 06/03/2019   HDL 24 (L) 06/03/2019   LDLCALC UNABLE TO CALCULATE IF TRIGLYCERIDE OVER 400 mg/dL 06/03/2019   LDLDIRECT 25.5 06/03/2019   TRIG 531 (H) 06/03/2019   CHOLHDL 4.8 06/03/2019    Significant Diagnostic Results in last 30 days:  No results found.  Assessment and Plan  Orthostatic hypotension Acceptable; continue midodrine 15 mg 3 times daily  GERD without esophagitis No reported problems; continue Prilosec 40 mg daily  Dyslipidemia associated with type 2 diabetes mellitus (HCC) Direct LDL is 25 on Lipitor 80 mg; continue Lipitor 80 mg     Hennie Duos, MD

## 2019-07-07 ENCOUNTER — Encounter: Payer: Self-pay | Admitting: Internal Medicine

## 2019-07-08 DIAGNOSIS — Z992 Dependence on renal dialysis: Secondary | ICD-10-CM | POA: Diagnosis not present

## 2019-07-08 DIAGNOSIS — N2581 Secondary hyperparathyroidism of renal origin: Secondary | ICD-10-CM | POA: Diagnosis not present

## 2019-07-08 DIAGNOSIS — N186 End stage renal disease: Secondary | ICD-10-CM | POA: Diagnosis not present

## 2019-07-08 DIAGNOSIS — D631 Anemia in chronic kidney disease: Secondary | ICD-10-CM | POA: Diagnosis not present

## 2019-07-08 DIAGNOSIS — D509 Iron deficiency anemia, unspecified: Secondary | ICD-10-CM | POA: Diagnosis not present

## 2019-07-09 ENCOUNTER — Encounter: Payer: Self-pay | Admitting: Internal Medicine

## 2019-07-09 NOTE — Assessment & Plan Note (Signed)
Direct LDL is 25 on Lipitor 80 mg; continue Lipitor 80 mg

## 2019-07-09 NOTE — Assessment & Plan Note (Signed)
No reported problems; continue Prilosec 40 mg daily

## 2019-07-09 NOTE — Assessment & Plan Note (Signed)
Acceptable; continue midodrine 15 mg 3 times daily

## 2019-07-11 DIAGNOSIS — N2581 Secondary hyperparathyroidism of renal origin: Secondary | ICD-10-CM | POA: Diagnosis not present

## 2019-07-11 DIAGNOSIS — Z992 Dependence on renal dialysis: Secondary | ICD-10-CM | POA: Diagnosis not present

## 2019-07-11 DIAGNOSIS — D631 Anemia in chronic kidney disease: Secondary | ICD-10-CM | POA: Diagnosis not present

## 2019-07-11 DIAGNOSIS — N186 End stage renal disease: Secondary | ICD-10-CM | POA: Diagnosis not present

## 2019-07-11 DIAGNOSIS — D509 Iron deficiency anemia, unspecified: Secondary | ICD-10-CM | POA: Diagnosis not present

## 2019-07-13 DIAGNOSIS — D509 Iron deficiency anemia, unspecified: Secondary | ICD-10-CM | POA: Diagnosis not present

## 2019-07-13 DIAGNOSIS — N186 End stage renal disease: Secondary | ICD-10-CM | POA: Diagnosis not present

## 2019-07-13 DIAGNOSIS — Z992 Dependence on renal dialysis: Secondary | ICD-10-CM | POA: Diagnosis not present

## 2019-07-13 DIAGNOSIS — D631 Anemia in chronic kidney disease: Secondary | ICD-10-CM | POA: Diagnosis not present

## 2019-07-13 DIAGNOSIS — N2581 Secondary hyperparathyroidism of renal origin: Secondary | ICD-10-CM | POA: Diagnosis not present

## 2019-07-15 DIAGNOSIS — D509 Iron deficiency anemia, unspecified: Secondary | ICD-10-CM | POA: Diagnosis not present

## 2019-07-15 DIAGNOSIS — N186 End stage renal disease: Secondary | ICD-10-CM | POA: Diagnosis not present

## 2019-07-15 DIAGNOSIS — N2581 Secondary hyperparathyroidism of renal origin: Secondary | ICD-10-CM | POA: Diagnosis not present

## 2019-07-15 DIAGNOSIS — Z992 Dependence on renal dialysis: Secondary | ICD-10-CM | POA: Diagnosis not present

## 2019-07-15 DIAGNOSIS — D631 Anemia in chronic kidney disease: Secondary | ICD-10-CM | POA: Diagnosis not present

## 2019-07-18 DIAGNOSIS — D509 Iron deficiency anemia, unspecified: Secondary | ICD-10-CM | POA: Diagnosis not present

## 2019-07-18 DIAGNOSIS — N186 End stage renal disease: Secondary | ICD-10-CM | POA: Diagnosis not present

## 2019-07-18 DIAGNOSIS — N2581 Secondary hyperparathyroidism of renal origin: Secondary | ICD-10-CM | POA: Diagnosis not present

## 2019-07-18 DIAGNOSIS — D631 Anemia in chronic kidney disease: Secondary | ICD-10-CM | POA: Diagnosis not present

## 2019-07-18 DIAGNOSIS — Z992 Dependence on renal dialysis: Secondary | ICD-10-CM | POA: Diagnosis not present

## 2019-07-20 DIAGNOSIS — N186 End stage renal disease: Secondary | ICD-10-CM | POA: Diagnosis not present

## 2019-07-20 DIAGNOSIS — D631 Anemia in chronic kidney disease: Secondary | ICD-10-CM | POA: Diagnosis not present

## 2019-07-20 DIAGNOSIS — Z992 Dependence on renal dialysis: Secondary | ICD-10-CM | POA: Diagnosis not present

## 2019-07-20 DIAGNOSIS — D509 Iron deficiency anemia, unspecified: Secondary | ICD-10-CM | POA: Diagnosis not present

## 2019-07-20 DIAGNOSIS — N2581 Secondary hyperparathyroidism of renal origin: Secondary | ICD-10-CM | POA: Diagnosis not present

## 2019-07-22 DIAGNOSIS — N186 End stage renal disease: Secondary | ICD-10-CM | POA: Diagnosis not present

## 2019-07-22 DIAGNOSIS — D631 Anemia in chronic kidney disease: Secondary | ICD-10-CM | POA: Diagnosis not present

## 2019-07-22 DIAGNOSIS — Z992 Dependence on renal dialysis: Secondary | ICD-10-CM | POA: Diagnosis not present

## 2019-07-22 DIAGNOSIS — D509 Iron deficiency anemia, unspecified: Secondary | ICD-10-CM | POA: Diagnosis not present

## 2019-07-22 DIAGNOSIS — N2581 Secondary hyperparathyroidism of renal origin: Secondary | ICD-10-CM | POA: Diagnosis not present

## 2019-07-25 DIAGNOSIS — N186 End stage renal disease: Secondary | ICD-10-CM | POA: Diagnosis not present

## 2019-07-25 DIAGNOSIS — Z992 Dependence on renal dialysis: Secondary | ICD-10-CM | POA: Diagnosis not present

## 2019-07-25 DIAGNOSIS — D509 Iron deficiency anemia, unspecified: Secondary | ICD-10-CM | POA: Diagnosis not present

## 2019-07-25 DIAGNOSIS — D631 Anemia in chronic kidney disease: Secondary | ICD-10-CM | POA: Diagnosis not present

## 2019-07-25 DIAGNOSIS — N2581 Secondary hyperparathyroidism of renal origin: Secondary | ICD-10-CM | POA: Diagnosis not present

## 2019-07-26 ENCOUNTER — Encounter: Payer: Self-pay | Admitting: Adult Health

## 2019-07-26 NOTE — Progress Notes (Signed)
Location:    [place] Room Number: 112/D Place of Service:  SNF (31)   CODE STATUS: Full Code  No Known Allergies  Chief Complaint  Patient presents with  . Acute Visit    Diabetes Management, Blood Sugar 172 mg/dl    HPI:    Past Medical History:  Diagnosis Date  . Abnormal CT scan, kidney 10/06/2011  . Acute pyelonephritis 10/07/2011  . Anemia    normocytic  . Anxiety    mental retardation  . Bladder wall thickening 10/06/2011  . BPH (benign prostatic hypertrophy)   . Diabetes mellitus   . Dialysis patient [facility])    Tuesday, Thursday and Saturday,   . DVT of leg (deep venous thrombosis) ([place]) 12/25/2016  . Edema     history of lower extremity edema  . GERD (gastroesophageal reflux disease)   . Heme positive stool   . Hydronephrosis   . Hyperkalemia   . Hyperlipidemia   . Hypernatremia   . Hypertension   . Hypothyroidism   . Impaired speech   . 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[place])   . MR (mental retardation)   . Muscle weakness   . Obstructive uropathy   . 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 Poor historian poor historian  . Protein calorie malnutrition ([place])   . Pyelonephritis   . Renal failure (ARF), acute on chronic (HCC)   . Renal insufficiency    chronic history  . Sepsis ([place])   . Smoking   . Uremia   . Urinary retention   . UTI (lower urinary tract infection) 10/06/2011    Past Surgical History:  Procedure Laterality Date  . A/V FISTULAGRAM N/A 08/13/2018   Procedure: A/V FISTULAGRAM - Right Upper;  Surgeon: Elam Dutch, MD;  Location: [place] [facility] LAB;  Service: Cardiovascular;  Laterality: N/A;  . A/V FISTULAGRAM N/A 11/22/2018   Procedure: A/V FISTULAGRAM - Right Upper;  Surgeon: Waynetta Sandy, MD;  Location: [place] [facility] LAB;  Service: Cardiovascular;  Laterality: N/A;  . AV FISTULA PLACEMENT Left 07/06/2015   Procedure:  INSERTION LEFT ARM ARTERIOVENOUS GORTEX GRAFT;  Surgeon: Angelia Mould, MD;  Location: [place];  Service: Vascular;  Laterality: Left;  . AV FISTULA PLACEMENT Right 02/26/2016   Procedure: ARTERIOVENOUS (AV) FISTULA CREATION ;  Surgeon: Angelia Mould, MD;  Location: [place];  Service: Vascular;  Laterality: Right;  . AV FISTULA PLACEMENT Right 11/25/2018   Procedure: INSERTION OF ARTERIOVENOUS (AV) ARTEGRAFT RIGHT UPPER ARM;  Surgeon: Waynetta Sandy, MD;  Location: [place];  Service: Vascular;  Laterality: Right;  . [place] REMOVAL Left 10/09/2015   Procedure: REMOVAL OF ARTERIOVENOUS GORETEX GRAFT ([place]) Evacuation of Lymphocele, Vein Patch angioplasty of brachial artery.;  Surgeon: Angelia Mould, MD;  Location: [place];  Service: Vascular;  Laterality: Left;  . BASCILIC VEIN TRANSPOSITION Right 02/26/2016   Procedure: Right BASCILIC VEIN TRANSPOSITION;  Surgeon: Angelia Mould, MD;  Location: [place];  Service: Vascular;  Laterality: Right;  . CIRCUMCISION N/A 01/04/2014   Procedure: CIRCUMCISION ADULT (procedure #1);  Surgeon: Marissa Nestle, MD;  Location: [facility];  Service: Urology;  Laterality: N/A;  . COLECTOMY N/A 05/04/2017   Procedure: TOTAL COLECTOMY;  Surgeon: Aviva Signs, MD;  Location: [facility];  Service: General;  Laterality: N/A;  . COLONOSCOPY N/A 04/27/2017   Procedure: COLONOSCOPY;  Surgeon: Daneil Dolin, MD;  [facility];  Service: Endoscopy;  Laterality: N/A;  245  .  CYSTOSCOPY W/ RETROGRADES Bilateral 06/29/2015   Procedure: CYSTOSCOPY, DILATION OF URETHRAL STRICTURE WITH BILATERAL RETROGRADE PYELOGRAM,SUPRAPUBIC TUBE CHANGE;  Surgeon: Festus Aloe, MD;  [facility];  Service: Urology;  Laterality: Bilateral;  . CYSTOSCOPY WITH URETHRAL DILATATION N/A 12/29/2013   Procedure: CYSTOSCOPY WITH URETHRAL DILATATION;  Surgeon: Marissa Nestle, MD;  Location: [facility];  Service: Urology;  Laterality: N/A;  . ESOPHAGOGASTRODUODENOSCOPY N/A 04/27/2017   Procedure: ESOPHAGOGASTRODUODENOSCOPY (EGD);  Surgeon: Daneil Dolin, MD;  [facility];  Service: Endoscopy;  Laterality: N/A;  . INSERTION OF DIALYSIS CATHETER Right 11/25/2018   Procedure: INSERTION OF DIALYSIS CATHETER RIGHT INTERNAL JUGULAR;  Surgeon: Waynetta Sandy, MD;  Location: [place];  Service: Vascular;  Laterality: Right;  . IR AV DIALY SHUNT INTRO [place] W/PTA/IMG RIGHT Right 09/07/2018  . IR REMOVAL TUN CV CATH W/O FL  01/12/2019  . IR THROMBECTOMY AV FISTULA W/THROMBOLYSIS/PTA INC/SHUNT/IMG RIGHT Right 04/26/2018  . IR US GUIDE VASC ACCESS RIGHT  04/26/2018  . IR US GUIDE VASC ACCESS RIGHT  09/07/2018  . ORIF FEMUR FRACTURE Right 11/22/2016   Procedure: OPEN REDUCTION INTERNAL FIXATION (ORIF) DISTAL FEMUR FRACTURE;  Surgeon: Rod Can, MD;  Location: [place];  Service: Orthopedics;  Laterality: Right;  . PATCH ANGIOPLASTY Right 12/10/2017   Procedure: PATCH ANGIOPLASTY;  Surgeon: Angelia Mould, MD;  Location: [facility] OR;  Service: Vascular;  Laterality: Right;  . PERIPHERAL VASCULAR BALLOON ANGIOPLASTY  08/13/2018   Procedure: PERIPHERAL VASCULAR BALLOON ANGIOPLASTY;  Surgeon: Elam Dutch, MD;  Location: [place] CV LAB;  Service: Cardiovascular;;  right AV fistula   . PERIPHERAL VASCULAR BALLOON ANGIOPLASTY  11/22/2018   Procedure: PERIPHERAL VASCULAR BALLOON ANGIOPLASTY;  Surgeon: Waynetta Sandy, MD;  Location: [place] CV LAB;  Service: Cardiovascular;;  rt AV fistula  . PERIPHERAL VASCULAR CATHETERIZATION N/A 10/08/2015   Procedure: A/V Shuntogram;  Surgeon: Angelia Mould, MD;  Location: [place] CV LAB;  Service: Cardiovascular;  Laterality: N/A;  . THROMBECTOMY W/ EMBOLECTOMY Right 12/10/2017   Procedure: THROMBECTOMY REVISION RIGHT ARM  ARTERIOVENOUS FISTULA;  Surgeon: Angelia Mould, MD;  Location: [place];  Service: Vascular;  Laterality: Right;  . TRANSURETHRAL RESECTION OF PROSTATE N/A 01/04/2014   Procedure: TRANSURETHRAL RESECTION OF THE PROSTATE  (TURP) (procedure #2);  Surgeon: Marissa Nestle, MD;  Location: [facility];  Service: Urology;  Laterality: N/A;    Social History   Socioeconomic History  . Marital status: Single    Spouse name: Not on file  . Number of children: Not on file  . Years of education: Not on file  . Highest education level: Not on file  Occupational History  . Not on file  Social Needs  . Financial resource strain: Not hard at all  . Food insecurity    Worry: Never true    Inability: Never true  . Transportation needs    Medical: No    Non-medical: No  Tobacco Use  . Smoking status: Never Smoker  . Smokeless tobacco: Never Used  Substance and Sexual Activity  . Alcohol use: No  . Drug use: No  . Sexual activity: Never  Lifestyle  . Physical activity    Days per week: 0 days    Minutes per session: Not on file  . Stress: Not at all  Relationships  . Social connections    Talks on phone: More than three times a week    Gets together: More than three times a week  Attends religious service: Never    Active member of club or organization: No    Attends meetings of clubs or organizations: Never    Relationship status: Never married  . Intimate partner violence    Fear of current or ex partner: No    Emotionally abused: No    Physically abused: No    Forced sexual activity: No  Other Topics Concern  . Not on file  Social History Narrative   Lives at nursing home.   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   Family History  Problem Relation Age of Onset  . Cancer Mother   . Colon cancer Neg Hx       VITAL SIGNS BP 102/70   Pulse 88   Temp 98.2 F (36.8 C) (Oral)   Resp (!) 24   Ht 5\' 8"  (1.727 m)   Wt 205 lb 6.4 oz (93.2 kg)   SpO2 100%   BMI 31.23 kg/m   Outpatient Encounter Medications as of 07/26/2019  Medication Sig  . atorvastatin (LIPITOR) 80 MG tablet Take 80 mg by mouth every evening.  . insulin aspart (NOVOLOG FLEXPEN) 100  UNIT/ML FlexPen Inject as Per sliding scale before meals:  < 60, call MD 200 - 300 = 8 units 301 - 400 = 10 units  . Insulin Glargine (BASAGLAR KWIKPEN) 100 UNIT/ML SOPN Inject 27 Units into the skin at bedtime.   Marland Kitchen JANUVIA 25 MG tablet Take 25 mg by mouth daily.   Marland Kitchen levothyroxine (SYNTHROID, LEVOTHROID) 88 MCG tablet Take 88 mcg by mouth daily before breakfast.  . midodrine (PROAMATINE) 10 MG tablet Take 10 mg by mouth. Send med with resident on Mon/Wed/Fri to dialysis. do not give [facility] give to resident to take with him. send with 5mg  to equal 15mg .  . midodrine (PROAMATINE) 5 MG tablet Take 5 mg by mouth. Send with resident on dialysis days.  Mon, Wed, Fri.  Take along with 10 mg to equal 15 mg  . Multiple Vitamin (MULTIVITAMIN WITH MINERALS) TABS tablet Take 1 tablet by mouth every evening.   . NON FORMULARY Fluid Restriction - 1200 cc a day 500 cc for days, Evenings 500 cc, Night 200 cc  . NON FORMULARY Diet Type:  NAS, Cons CHO Diet  . Nutritional Supplements (NEPRO/CARBSTEADY PO) Take 1 Can by mouth 2 (two) times daily.  . Omega-3 Fatty Acids (FISH OIL PO) Take 1 capsule by mouth at bedtime.   Marland Kitchen omeprazole (PRILOSEC) 40 MG capsule Take 40 mg by mouth at bedtime.   . polyethylene glycol (MIRALAX / GLYCOLAX) packet Take 17 g by mouth as needed for mild constipation.   . sevelamer carbonate (RENVELA) 800 MG tablet Take 1,600 mg by mouth 3 (three) times daily.   . sevelamer carbonate (RENVELA) 800 MG tablet Take 800 mg by mouth daily. With snacks  . tamsulosin (FLOMAX) 0.4 MG CAPS capsule Take 0.4 mg by mouth every evening. Give 30 minutes after a meal  . torsemide (DEMADEX) 10 MG tablet Take 5 tablets (50 mg total) by mouth daily.   No facility-administered encounter medications on file as of 07/26/2019.      SIGNIFICANT DIAGNOSTIC EXAMS       ASSESSMENT/ PLAN:    MD is aware of resident's narcotic use and is in agreement with current plan of care. We will attempt to  wean resident as appropriate.  Ok Edwards NP [facility]  Contact [phone] Monday through Friday 8am- 5pm  After hours call [phone]

## 2019-07-27 ENCOUNTER — Encounter: Payer: Self-pay | Admitting: Adult Health

## 2019-07-27 ENCOUNTER — Non-Acute Institutional Stay (SKILLED_NURSING_FACILITY): Payer: Medicare Other | Admitting: Adult Health

## 2019-07-27 DIAGNOSIS — N2581 Secondary hyperparathyroidism of renal origin: Secondary | ICD-10-CM | POA: Diagnosis not present

## 2019-07-27 DIAGNOSIS — N186 End stage renal disease: Secondary | ICD-10-CM | POA: Diagnosis not present

## 2019-07-27 DIAGNOSIS — D509 Iron deficiency anemia, unspecified: Secondary | ICD-10-CM | POA: Diagnosis not present

## 2019-07-27 DIAGNOSIS — E1122 Type 2 diabetes mellitus with diabetic chronic kidney disease: Secondary | ICD-10-CM | POA: Diagnosis not present

## 2019-07-27 DIAGNOSIS — Z992 Dependence on renal dialysis: Secondary | ICD-10-CM | POA: Diagnosis not present

## 2019-07-27 DIAGNOSIS — I120 Hypertensive chronic kidney disease with stage 5 chronic kidney disease or end stage renal disease: Secondary | ICD-10-CM | POA: Diagnosis not present

## 2019-07-27 DIAGNOSIS — D631 Anemia in chronic kidney disease: Secondary | ICD-10-CM | POA: Diagnosis not present

## 2019-07-27 NOTE — Progress Notes (Signed)
Location:    [place] Room Number: 112/D Place of Service:  SNF (31)   CODE STATUS: Full Code  No Known Allergies  Chief Complaint  Patient presents with  . Acute Visit    Care Plan Meeting, Blood Sugar 161 mg/dl    HPI:  We have come together for his care plan meeting. BIMS 7/15 mood 8/30. He has has not had any falls. No significant changes in his weight. His cbg readings are elevated. He denies any uncontrolled pain. He denies any excessive thirst. He denies any excessive hunger.   Past Medical History:  Diagnosis Date  . Abnormal CT scan, kidney 10/06/2011  . Acute pyelonephritis 10/07/2011  . Anemia    normocytic  . Anxiety    mental retardation  . Bladder wall thickening 10/06/2011  . BPH (benign prostatic hypertrophy)   . Diabetes mellitus   . Dialysis patient [facility])    Tuesday, Thursday and Saturday,   . DVT of leg (deep venous thrombosis) ([place]) 12/25/2016  . Edema     history of lower extremity edema  . GERD (gastroesophageal reflux disease)   . Heme positive stool   . Hydronephrosis   . Hyperkalemia   . Hyperlipidemia   . Hypernatremia   . Hypertension   . Hypothyroidism   . Impaired speech   . 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 MR (mental retardation)   . Muscle weakness   . Obstructive uropathy   . 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 Poor historian poor historian  . Protein calorie malnutrition ([place])   . Pyelonephritis   . Renal failure (ARF), acute on chronic (HCC)   . Renal insufficiency    chronic history  . Sepsis ([place])   . Smoking   . Uremia   . Urinary retention   . UTI (lower urinary tract infection) 10/06/2011    Past Surgical History:  Procedure Laterality Date  . A/V FISTULAGRAM N/A 08/13/2018   Procedure: A/V FISTULAGRAM - Right Upper;  Surgeon: Elam Dutch, MD;  Location: [place] CV LAB;  Service: Cardiovascular;  Laterality: N/A;  . A/V FISTULAGRAM N/A 11/22/2018   Procedure: A/V  FISTULAGRAM - Right Upper;  Surgeon: Waynetta Sandy, MD;  Location: [place] [facility];  Service: Cardiovascular;  Laterality: N/A;  . AV FISTULA PLACEMENT Left 07/06/2015   Procedure:  INSERTION LEFT ARM ARTERIOVENOUS GORTEX GRAFT;  Surgeon: Angelia Mould, MD;  Location: [place];  Service: Vascular;  Laterality: Left;  . AV FISTULA PLACEMENT Right 02/26/2016   Procedure: ARTERIOVENOUS (AV) FISTULA CREATION ;  Surgeon: Angelia Mould, MD;  Location: [place];  Service: Vascular;  Laterality: Right;  . AV FISTULA PLACEMENT Right 11/25/2018   Procedure: INSERTION OF ARTERIOVENOUS (AV) ARTEGRAFT RIGHT UPPER ARM;  Surgeon: Waynetta Sandy, MD;  Location: [place];  Service: Vascular;  Laterality: Right;  . [place] REMOVAL Left 10/09/2015   Procedure: REMOVAL OF ARTERIOVENOUS GORETEX GRAFT ([place]) Evacuation of Lymphocele, Vein Patch angioplasty of brachial artery.;  Surgeon: Angelia Mould, MD;  Location: [place];  Service: Vascular;  Laterality: Left;  . BASCILIC VEIN TRANSPOSITION Right 02/26/2016   Procedure: Right BASCILIC VEIN TRANSPOSITION;  Surgeon: Angelia Mould, MD;  Location: [place];  Service: Vascular;  Laterality: Right;  . CIRCUMCISION N/A 01/04/2014   Procedure: CIRCUMCISION ADULT (procedure #1);  Surgeon: Marissa Nestle, MD;  [facility];  Service: Urology;  Laterality: N/A;  . COLECTOMY N/A 05/04/2017   Procedure:  TOTAL COLECTOMY;  Surgeon: Aviva Signs, MD;  Location: [facility];  Service: General;  Laterality: N/A;  . COLONOSCOPY N/A 04/27/2017   Procedure: COLONOSCOPY;  Surgeon: Daneil Dolin, MD;  [facility];  Service: Endoscopy;  Laterality: N/A;  245  . CYSTOSCOPY W/ RETROGRADES Bilateral 06/29/2015   Procedure: CYSTOSCOPY, DILATION OF URETHRAL STRICTURE WITH BILATERAL RETROGRADE PYELOGRAM,SUPRAPUBIC TUBE CHANGE;  Surgeon: Festus Aloe, MD;  Location: [facility];  Service: Urology;  Laterality: Bilateral;  . CYSTOSCOPY WITH URETHRAL  DILATATION N/A 12/29/2013   Procedure: CYSTOSCOPY WITH URETHRAL DILATATION;  Surgeon: Marissa Nestle, MD;  [facility];  Service: Urology;  Laterality: N/A;  . ESOPHAGOGASTRODUODENOSCOPY N/A 04/27/2017   Procedure: ESOPHAGOGASTRODUODENOSCOPY (EGD);  Surgeon: Daneil Dolin, MD;  [facility];  Service: Endoscopy;  Laterality: N/A;  . INSERTION OF DIALYSIS CATHETER Right 11/25/2018   Procedure: INSERTION OF DIALYSIS CATHETER RIGHT INTERNAL JUGULAR;  Surgeon: Waynetta Sandy, MD;  Location: [place];  Service: Vascular;  Laterality: Right;  . IR AV DIALY SHUNT INTRO [place] W/PTA/IMG RIGHT Right 09/07/2018  . IR REMOVAL TUN CV CATH W/O FL  01/12/2019  . IR THROMBECTOMY AV FISTULA W/THROMBOLYSIS/PTA INC/SHUNT/IMG RIGHT Right 04/26/2018  . IR US GUIDE VASC ACCESS RIGHT  04/26/2018  . IR US GUIDE VASC ACCESS RIGHT  09/07/2018  . ORIF FEMUR FRACTURE Right 11/22/2016   Procedure: OPEN REDUCTION INTERNAL FIXATION (ORIF) DISTAL FEMUR FRACTURE;  Surgeon: Rod Can, MD;  Location: [place];  Service: Orthopedics;  Laterality: Right;  . PATCH ANGIOPLASTY Right 12/10/2017   Procedure: PATCH ANGIOPLASTY;  Surgeon: Angelia Mould, MD;  [facility];  Service: Vascular;  Laterality: Right;  . PERIPHERAL VASCULAR BALLOON ANGIOPLASTY  08/13/2018   Procedure: PERIPHERAL VASCULAR BALLOON ANGIOPLASTY;  Surgeon: Elam Dutch, MD;  Location: [place] [facility];  Service: Cardiovascular;;  right AV fistula   . PERIPHERAL VASCULAR BALLOON ANGIOPLASTY  11/22/2018   Procedure: PERIPHERAL VASCULAR BALLOON ANGIOPLASTY;  Surgeon: Waynetta Sandy, MD;  Location: [place] [facility];  Service: Cardiovascular;;  rt AV fistula  . PERIPHERAL VASCULAR CATHETERIZATION N/A 10/08/2015   Procedure: A/V Shuntogram;  Surgeon: Angelia Mould, MD;  Location: [place] [facility];  Service: Cardiovascular;  Laterality: N/A;  . THROMBECTOMY W/ EMBOLECTOMY Right 12/10/2017    Procedure: THROMBECTOMY REVISION RIGHT ARM  ARTERIOVENOUS FISTULA;  Surgeon: Angelia Mould, MD;  Location: [place];  Service: Vascular;  Laterality: Right;  . TRANSURETHRAL RESECTION OF PROSTATE N/A 01/04/2014   Procedure: TRANSURETHRAL RESECTION OF THE PROSTATE (TURP) (procedure #2);  Surgeon: Marissa Nestle, MD;  [facility];  Service: Urology;  Laterality: N/A;    Social History   Socioeconomic History  . Marital status: Single    Spouse name: Not on file  . Number of children: Not on file  . Years of education: Not on file  . Highest education level: Not on file  Occupational History  . Not on file  Social Needs  . Financial resource strain: Not hard at all  . Food insecurity    Worry: Never true    Inability: Never true  . Transportation needs    Medical: No    Non-medical: No  Tobacco Use  . Smoking status: Never Smoker  . Smokeless tobacco: Never Used  Substance and Sexual Activity  . Alcohol use: No  . Drug use: No  . Sexual activity: Never  Lifestyle  . Physical activity    Days per week:  0 days    Minutes per session: Not on file  . Stress: Not at all  Relationships  . Social connections    Talks on phone: More than three times a week    Gets together: More than three times a week    Attends religious service: Never    Active member of club or organization: No    Attends meetings of clubs or organizations: Never    Relationship status: Never married  . Intimate partner violence    Fear of current or ex partner: No    Emotionally abused: No    Physically abused: No    Forced sexual activity: No  Other Topics Concern  . Not on file  Social History Narrative   Lives at nursing home.   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   Family History  Problem Relation Age of Onset  . Cancer Mother   . Colon cancer Neg Hx       VITAL SIGNS BP 102/70   Pulse 88   Temp 98.2 F (36.8 C) (Oral)   Resp (!) 24    Ht 5\' 8"  (1.727 m)   Wt 205 lb 6.4 oz (93.2 kg)   SpO2 100%   BMI 31.23 kg/m   Outpatient Encounter Medications as of 07/27/2019  Medication Sig  . atorvastatin (LIPITOR) 80 MG tablet Take 80 mg by mouth every evening.  . insulin aspart (NOVOLOG FLEXPEN) 100 UNIT/ML FlexPen Inject as Per sliding scale before meals:  < 60, call MD 200 - 300 = 8 units 301 - 400 = 10 units  . Insulin Glargine (BASAGLAR KWIKPEN) 100 UNIT/ML SOPN Inject 27 Units into the skin at bedtime.   Marland Kitchen JANUVIA 25 MG tablet Take 25 mg by mouth daily.   Marland Kitchen levothyroxine (SYNTHROID, LEVOTHROID) 88 MCG tablet Take 88 mcg by mouth daily before breakfast.  . midodrine (PROAMATINE) 10 MG tablet Take 10 mg by mouth. Send med with resident on Mon/Wed/Fri to dialysis. do not give [facility] give to resident to take with him. send with 5mg  to equal 15mg .  . midodrine (PROAMATINE) 5 MG tablet Take 5 mg by mouth. Send with resident on dialysis days.  Mon, Wed, Fri.  Take along with 10 mg to equal 15 mg  . Multiple Vitamin (MULTIVITAMIN WITH MINERALS) TABS tablet Take 1 tablet by mouth every evening.   . NON FORMULARY Fluid Restriction - 1200 cc a day 500 cc for days, Evenings 500 cc, Night 200 cc  . NON FORMULARY Diet Type:  NAS, Cons CHO Diet  . Nutritional Supplements (NEPRO/CARBSTEADY PO) Take 1 Can by mouth 2 (two) times daily.  . Omega-3 Fatty Acids (FISH OIL PO) Take 2 capsules by mouth at bedtime.   Marland Kitchen omeprazole (PRILOSEC) 40 MG capsule Take 40 mg by mouth at bedtime.   . polyethylene glycol (MIRALAX / GLYCOLAX) packet Take 17 g by mouth as needed for mild constipation.   . sevelamer carbonate (RENVELA) 800 MG tablet Take 1,600 mg by mouth 3 (three) times daily.   . sevelamer carbonate (RENVELA) 800 MG tablet Take 800 mg by mouth daily. With snacks  . tamsulosin (FLOMAX) 0.4 MG CAPS capsule Take 0.4 mg by mouth every evening. Give 30 minutes after a meal  . torsemide (DEMADEX) 10 MG tablet Take 5 tablets (50 mg total) by  mouth daily.   No facility-administered encounter medications on file as of 07/27/2019.      SIGNIFICANT DIAGNOSTIC EXAMS  LABS REVIEWED PREVIOUS;  12-06-18: tsh 2.752 01-25-19: wbc 5.6; hgb 12.9 hct 40.8; mcv 110.3; plt 146; glucose 334; bun 25; creat 6.78; k+ 4.7; na++ 137; ca 9.0 liver normal albumin 4.0  Hgb a1c 6.9; chol 94 trig 439 LDL direct 16.3 05-10-19 hgb a1c 7.6   06-03-19: wbc 6.3; hgb 10.8; hct 33.4; mcv 106.7; plt 132; glucose 160; bun 30; creat 8.46; k+ 4.6; na++ 137; ca 9.1; liver normal albumin 3.6; chol 115; LDL 25.5; trig 531; hdl 24; PSA 2.73  NO NEW LABS.    Review of Systems  Constitutional: Negative for malaise/fatigue.  Respiratory: Negative for cough and shortness of breath.   Cardiovascular: Negative for chest pain, palpitations and leg swelling.  Gastrointestinal: Negative for abdominal pain, constipation and heartburn.  Musculoskeletal: Negative for back pain, joint pain and myalgias.  Skin: Negative.   Neurological: Negative for dizziness.  Psychiatric/Behavioral: The patient is not nervous/anxious.     Physical Exam Constitutional:      General: He is not in acute distress.    Appearance: He is well-developed. He is not diaphoretic.  Neck:     Musculoskeletal: Neck supple.     Thyroid: No thyromegaly.  Cardiovascular:     Rate and Rhythm: Regular rhythm.     Pulses: Normal pulses.     Heart sounds: Normal heart sounds.  Pulmonary:     Effort: Pulmonary effort is normal. No respiratory distress.     Breath sounds: Normal breath sounds.  Abdominal:     General: Bowel sounds are normal. There is no distension.     Palpations: Abdomen is soft.     Tenderness: There is no abdominal tenderness.     Comments: History of colectomy   Genitourinary:    Comments: History of turp  Musculoskeletal:     Right lower leg: No edema.     Left lower leg: No edema.     Comments:  Is able to move all extremities History of right femur ORIF     Lymphadenopathy:     Cervical: No cervical adenopathy.  Skin:    General: Skin is warm and dry.     Comments: Right upper extremity a/v fistula: + thrill + bruit     Neurological:     Mental Status: He is alert. Mental status is at baseline.  Psychiatric:        Mood and Affect: Mood normal.       ASSESSMENT/ PLAN:  TODAY   1. 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 Dependence on dialysis 3. End stage renal disease on dialysis due to type 2 diabetes mellitus  cbgs are elevated  Will increase to novolog 14 units with meals and basaglar to 30 units Will continue current plan of care Will continue to monitor his status.      MD is aware of resident's narcotic use and is in agreement with current plan of care. We will attempt to wean resident as appropriate.  Ok Edwards NP [facility]  Contact [phone] Monday through Friday 8am- 5pm  After hours call [phone]

## 2019-07-29 DIAGNOSIS — N186 End stage renal disease: Secondary | ICD-10-CM | POA: Diagnosis not present

## 2019-07-29 DIAGNOSIS — R1110 Vomiting, unspecified: Secondary | ICD-10-CM | POA: Diagnosis not present

## 2019-07-29 DIAGNOSIS — N2581 Secondary hyperparathyroidism of renal origin: Secondary | ICD-10-CM | POA: Diagnosis not present

## 2019-07-29 DIAGNOSIS — D509 Iron deficiency anemia, unspecified: Secondary | ICD-10-CM | POA: Diagnosis not present

## 2019-07-29 DIAGNOSIS — Z992 Dependence on renal dialysis: Secondary | ICD-10-CM | POA: Diagnosis not present

## 2019-07-29 DIAGNOSIS — Z23 Encounter for immunization: Secondary | ICD-10-CM | POA: Diagnosis not present

## 2019-07-29 DIAGNOSIS — R110 Nausea: Secondary | ICD-10-CM | POA: Diagnosis not present

## 2019-07-29 DIAGNOSIS — D631 Anemia in chronic kidney disease: Secondary | ICD-10-CM | POA: Diagnosis not present

## 2019-07-30 NOTE — Progress Notes (Signed)
This encounter was created in error - please disregard.

## 2019-07-31 DIAGNOSIS — E1122 Type 2 diabetes mellitus with diabetic chronic kidney disease: Secondary | ICD-10-CM | POA: Insufficient documentation

## 2019-07-31 DIAGNOSIS — Z992 Dependence on renal dialysis: Secondary | ICD-10-CM | POA: Insufficient documentation

## 2019-07-31 DIAGNOSIS — I120 Hypertensive chronic kidney disease with stage 5 chronic kidney disease or end stage renal disease: Secondary | ICD-10-CM | POA: Insufficient documentation

## 2019-08-01 DIAGNOSIS — N2581 Secondary hyperparathyroidism of renal origin: Secondary | ICD-10-CM | POA: Diagnosis not present

## 2019-08-01 DIAGNOSIS — Z992 Dependence on renal dialysis: Secondary | ICD-10-CM | POA: Diagnosis not present

## 2019-08-01 DIAGNOSIS — D509 Iron deficiency anemia, unspecified: Secondary | ICD-10-CM | POA: Diagnosis not present

## 2019-08-01 DIAGNOSIS — R110 Nausea: Secondary | ICD-10-CM | POA: Diagnosis not present

## 2019-08-01 DIAGNOSIS — Z23 Encounter for immunization: Secondary | ICD-10-CM | POA: Diagnosis not present

## 2019-08-01 DIAGNOSIS — N186 End stage renal disease: Secondary | ICD-10-CM | POA: Diagnosis not present

## 2019-08-03 ENCOUNTER — Non-Acute Institutional Stay (SKILLED_NURSING_FACILITY): Payer: Medicare Other | Admitting: Adult Health

## 2019-08-03 ENCOUNTER — Encounter: Payer: Self-pay | Admitting: Adult Health

## 2019-08-03 DIAGNOSIS — Z992 Dependence on renal dialysis: Secondary | ICD-10-CM | POA: Diagnosis not present

## 2019-08-03 DIAGNOSIS — F321 Major depressive disorder, single episode, moderate: Secondary | ICD-10-CM

## 2019-08-03 DIAGNOSIS — N186 End stage renal disease: Secondary | ICD-10-CM | POA: Diagnosis not present

## 2019-08-03 DIAGNOSIS — E038 Other specified hypothyroidism: Secondary | ICD-10-CM

## 2019-08-03 DIAGNOSIS — D509 Iron deficiency anemia, unspecified: Secondary | ICD-10-CM | POA: Diagnosis not present

## 2019-08-03 DIAGNOSIS — R110 Nausea: Secondary | ICD-10-CM | POA: Diagnosis not present

## 2019-08-03 DIAGNOSIS — E1122 Type 2 diabetes mellitus with diabetic chronic kidney disease: Secondary | ICD-10-CM | POA: Diagnosis not present

## 2019-08-03 DIAGNOSIS — D631 Anemia in chronic kidney disease: Secondary | ICD-10-CM | POA: Diagnosis not present

## 2019-08-03 DIAGNOSIS — N2581 Secondary hyperparathyroidism of renal origin: Secondary | ICD-10-CM | POA: Diagnosis not present

## 2019-08-03 DIAGNOSIS — Z23 Encounter for immunization: Secondary | ICD-10-CM | POA: Diagnosis not present

## 2019-08-03 NOTE — Progress Notes (Signed)
Location:    [place] Room Number: 112/D Place of Service:  SNF (31)   CODE STATUS: Full Code  No Known Allergies  Chief Complaint  Patient presents with  . 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 He is experiencing depressive thoughts; of sadness; worthlessness. He denies any insomnia; no changes in appetite; no suicidal thoughts.   Past Medical History:  Diagnosis Date  . Abnormal CT scan, kidney 10/06/2011  . Acute pyelonephritis 10/07/2011  . Anemia    normocytic  . Anxiety    mental retardation  . Bladder wall thickening 10/06/2011  . BPH (benign prostatic hypertrophy)   . Diabetes mellitus   . Dialysis patient [facility])    Tuesday, Thursday and Saturday,   . DVT of leg (deep venous thrombosis) ([place]) 12/25/2016  . Edema     history of lower extremity edema  . GERD (gastroesophageal reflux disease)   . Heme positive stool   . Hydronephrosis   . Hyperkalemia   . Hyperlipidemia   . Hypernatremia   . Hypertension   . Hypothyroidism   . Impaired speech   . 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 MR (mental retardation)   . Muscle weakness   . Obstructive uropathy   . 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 Poor historian poor historian  . Protein calorie malnutrition ([place])   . Pyelonephritis   . Renal failure (ARF), acute on chronic (HCC)   . Renal insufficiency    chronic history  . Sepsis ([place])   . Smoking   . Uremia   . Urinary retention   . UTI (lower urinary tract infection) 10/06/2011    Past Surgical History:  Procedure Laterality Date  . A/V FISTULAGRAM N/A 08/13/2018   Procedure:  A/V FISTULAGRAM - Right Upper;  Surgeon: Elam Dutch, MD;  Location: [place] CV LAB;  Service: Cardiovascular;  Laterality: N/A;  . A/V FISTULAGRAM N/A 11/22/2018   Procedure: A/V FISTULAGRAM - Right Upper;  Surgeon: Waynetta Sandy, MD;  Location: [place] CV LAB;  Service: Cardiovascular;  Laterality: N/A;  . AV FISTULA PLACEMENT Left 07/06/2015   Procedure:  INSERTION LEFT ARM ARTERIOVENOUS GORTEX GRAFT;  Surgeon: Angelia Mould, MD;  Location: [place];  Service: Vascular;  Laterality: Left;  . AV FISTULA PLACEMENT Right 02/26/2016   Procedure: ARTERIOVENOUS (AV) FISTULA CREATION ;  Surgeon: Angelia Mould, MD;  Location: [place];  Service: Vascular;  Laterality: Right;  . AV FISTULA PLACEMENT Right 11/25/2018   Procedure: INSERTION OF ARTERIOVENOUS (AV) ARTEGRAFT RIGHT UPPER ARM;  Surgeon: Waynetta Sandy, MD;  Location: [place];  Service: Vascular;  Laterality: Right;  . [place] REMOVAL Left 10/09/2015   Procedure: REMOVAL OF ARTERIOVENOUS GORETEX GRAFT ([place]) Evacuation of Lymphocele, Vein Patch angioplasty of brachial artery.;  Surgeon: Angelia Mould, MD;  Location: [place];  Service: Vascular;  Laterality: Left;  . BASCILIC VEIN TRANSPOSITION Right 02/26/2016   Procedure: Right BASCILIC VEIN TRANSPOSITION;  Surgeon: Angelia Mould, MD;  Location: [place];  Service: Vascular;  Laterality: Right;  . CIRCUMCISION N/A 01/04/2014   Procedure:  CIRCUMCISION ADULT (procedure #1);  Surgeon: Marissa Nestle, MD;  Location: [facility];  Service: Urology;  Laterality: N/A;  . COLECTOMY N/A 05/04/2017   Procedure: TOTAL COLECTOMY;  Surgeon: Aviva Signs, MD;  Location: [facility];  Service: General;  Laterality: N/A;  . COLONOSCOPY N/A 04/27/2017   Procedure: COLONOSCOPY;  Surgeon: Daneil Dolin, MD;  [facility];  Service: Endoscopy;  Laterality: N/A;  245  . CYSTOSCOPY W/ RETROGRADES Bilateral 06/29/2015   Procedure: CYSTOSCOPY, DILATION OF URETHRAL  STRICTURE WITH BILATERAL RETROGRADE PYELOGRAM,SUPRAPUBIC TUBE CHANGE;  Surgeon: Festus Aloe, MD;  Location: [facility];  Service: Urology;  Laterality: Bilateral;  . CYSTOSCOPY WITH URETHRAL DILATATION N/A 12/29/2013   Procedure: CYSTOSCOPY WITH URETHRAL DILATATION;  Surgeon: Marissa Nestle, MD;  [facility];  Service: Urology;  Laterality: N/A;  . ESOPHAGOGASTRODUODENOSCOPY N/A 04/27/2017   Procedure: ESOPHAGOGASTRODUODENOSCOPY (EGD);  Surgeon: Daneil Dolin, MD;  [facility];  Service: Endoscopy;  Laterality: N/A;  . INSERTION OF DIALYSIS CATHETER Right 11/25/2018   Procedure: INSERTION OF DIALYSIS CATHETER RIGHT INTERNAL JUGULAR;  Surgeon: Waynetta Sandy, MD;  Location: [place];  Service: Vascular;  Laterality: Right;  . IR AV DIALY SHUNT INTRO [place] W/PTA/IMG RIGHT Right 09/07/2018  . IR REMOVAL TUN CV CATH W/O FL  01/12/2019  . IR THROMBECTOMY AV FISTULA W/THROMBOLYSIS/PTA INC/SHUNT/IMG RIGHT Right 04/26/2018  . IR US GUIDE VASC ACCESS RIGHT  04/26/2018  . IR US GUIDE VASC ACCESS RIGHT  09/07/2018  . ORIF FEMUR FRACTURE Right 11/22/2016   Procedure: OPEN REDUCTION INTERNAL FIXATION (ORIF) DISTAL FEMUR FRACTURE;  Surgeon: Rod Can, MD;  Location: [place];  Service: Orthopedics;  Laterality: Right;  . PATCH ANGIOPLASTY Right 12/10/2017   Procedure: PATCH ANGIOPLASTY;  Surgeon: Angelia Mould, MD;  Location: [facility] OR;  Service: Vascular;  Laterality: Right;  . PERIPHERAL VASCULAR BALLOON ANGIOPLASTY  08/13/2018   Procedure: PERIPHERAL VASCULAR BALLOON ANGIOPLASTY;  Surgeon: Elam Dutch, MD;  Location: [place] [facility];  Service: Cardiovascular;;  right AV fistula   . PERIPHERAL VASCULAR BALLOON ANGIOPLASTY  11/22/2018   Procedure: PERIPHERAL VASCULAR BALLOON ANGIOPLASTY;  Surgeon: Waynetta Sandy, MD;  Location: [place] [facility];  Service: Cardiovascular;;  rt AV fistula  . PERIPHERAL VASCULAR CATHETERIZATION N/A 10/08/2015    Procedure: A/V Shuntogram;  Surgeon: Angelia Mould, MD;  Location: [place] [facility];  Service: Cardiovascular;  Laterality: N/A;  . THROMBECTOMY W/ EMBOLECTOMY Right 12/10/2017   Procedure: THROMBECTOMY REVISION RIGHT ARM  ARTERIOVENOUS FISTULA;  Surgeon: Angelia Mould, MD;  Location: [place];  Service: Vascular;  Laterality: Right;  . TRANSURETHRAL RESECTION OF PROSTATE N/A 01/04/2014   Procedure: TRANSURETHRAL RESECTION OF THE PROSTATE (TURP) (procedure #2);  Surgeon: Marissa Nestle, MD;  [facility];  Service: Urology;  Laterality: N/A;    Social History   Socioeconomic History  . Marital status: Single    Spouse name: Not on file  . Number of children: Not on file  . Years of education: Not on file  . Highest education level: Not on file  Occupational History  . Not on file  Social Needs  . Financial resource strain: Not hard at all  . Food insecurity    Worry: Never true    Inability: Never true  . Transportation needs    Medical: No    Non-medical: No  Tobacco Use  . Smoking status: Never Smoker  . Smokeless tobacco: Never Used  Substance and Sexual  Activity  . Alcohol use: No  . Drug use: No  . Sexual activity: Never  Lifestyle  . Physical activity    Days per week: 0 days    Minutes per session: Not on file  . Stress: Not at all  Relationships  . Social connections    Talks on phone: More than three times a week    Gets together: More than three times a week    Attends religious service: Never    Active member of club or organization: No    Attends meetings of clubs or organizations: Never    Relationship status: Never married  . Intimate partner violence    Fear of current or ex partner: No    Emotionally abused: No    Physically abused: No    Forced sexual activity: No  Other Topics Concern  . Not on file  Social History Narrative   Lives at nursing home.   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   Family History  Problem Relation Age of Onset  . Cancer Mother   . Colon cancer Neg Hx       VITAL SIGNS BP 96/60   Pulse 74   Temp 98.3 F (36.8 C) (Oral)   Resp 20   Ht 5\' 8"  (1.727 m)   Wt 210 lb 6.4 oz (95.4 kg)   SpO2 100%   BMI 31.99 kg/m   Outpatient Encounter Medications as of 08/03/2019  Medication Sig  . atorvastatin (LIPITOR) 80 MG tablet Take 80 mg by mouth every evening.  . insulin aspart (NOVOLOG FLEXPEN) 100 UNIT/ML FlexPen Inject 13 Units into the skin 3 (three) times daily with meals.   . Insulin Glargine (BASAGLAR KWIKPEN) 100 UNIT/ML SOPN Inject 30 Units into the skin at bedtime.   Marland Kitchen JANUVIA 25 MG tablet Take 25 mg by mouth daily.   Marland Kitchen levothyroxine (SYNTHROID, LEVOTHROID) 88 MCG tablet Take 88 mcg by mouth daily before breakfast.  . midodrine (PROAMATINE) 10 MG tablet Take 10 mg by mouth. Send med with resident on Mon/Wed/Fri to dialysis. do not give [facility] give to resident to take with him. send with 5mg  to equal 15mg .  . midodrine (PROAMATINE) 5 MG tablet Take 5 mg by mouth. Send with resident on dialysis days.  Mon, Wed, Fri.  Take along with 10 mg to equal 15 mg  . Multiple Vitamin (MULTIVITAMIN WITH MINERALS) TABS tablet Take 1 tablet by mouth every evening.   . NON FORMULARY Fluid Restriction - 1200 cc a day 500 cc for days, Evenings 500 cc, Night 200 cc  . NON FORMULARY Diet Type:  NAS, Cons CHO Diet  . Nutritional Supplements (NEPRO/CARBSTEADY PO) Take 1 Can by mouth 2 (two) times daily.  . Omega-3 Fatty Acids (FISH OIL PO) Take 2 capsules by mouth at bedtime.   Marland Kitchen omeprazole (PRILOSEC) 40 MG capsule Take 40 mg by mouth at bedtime.   . polyethylene glycol (MIRALAX / GLYCOLAX) packet Take 17 g by mouth as needed for mild constipation.   . sevelamer carbonate (RENVELA) 800 MG tablet Take 1,600 mg by mouth 3 (three) times daily.   . sevelamer carbonate (RENVELA) 800 MG tablet Take 800 mg by mouth daily. With snacks  . tamsulosin  (FLOMAX) 0.4 MG CAPS capsule Take 0.4 mg by mouth every evening. Give 30 minutes after a meal  . torsemide (DEMADEX) 10 MG tablet Take 5 tablets (50 mg total) by mouth daily.   No facility-administered encounter  medications on file as of 08/03/2019.      SIGNIFICANT DIAGNOSTIC EXAMS   LABS REVIEWED PREVIOUS;  12-06-18: tsh 2.752 01-25-19: wbc 5.6; hgb 12.9 hct 40.8; mcv 110.3; plt 146; glucose 334; bun 25; creat 6.78; k+ 4.7; na++ 137; ca 9.0 liver normal albumin 4.0  Hgb a1c 6.9; chol 94 trig 439 LDL direct 16.3 05-10-19 hgb a1c 7.6   06-03-19: wbc 6.3; hgb 10.8; hct 33.4; mcv 106.7; plt 132; glucose 160; bun 30; creat 8.46; k+ 4.6; na++ 137; ca 9.1; liver normal albumin 3.6; chol 115; LDL 25.5; trig 531; hdl 24; PSA 2.73  NO NEW LABS.   Review of Systems  Constitutional: Negative for malaise/fatigue.  Respiratory: Negative for cough and shortness of breath.   Cardiovascular: Negative for chest pain, palpitations and leg swelling.  Gastrointestinal: Negative for abdominal pain, constipation and heartburn.  Musculoskeletal: Negative for back pain, joint pain and myalgias.  Skin: Negative.   Neurological: Negative for dizziness.  Psychiatric/Behavioral: Positive for depression. The patient is not nervous/anxious.       Physical Exam Constitutional:      General: He is not in acute distress.    Appearance: He is well-developed. He is not diaphoretic.  Neck:     Musculoskeletal: Neck supple.     Thyroid: No thyromegaly.  Cardiovascular:     Rate and Rhythm: Normal rate and regular rhythm.     Pulses: Normal pulses.     Heart sounds: Normal heart sounds.  Pulmonary:     Effort: Pulmonary effort is normal. No respiratory distress.     Breath sounds: Normal breath sounds.  Abdominal:     General: Bowel sounds are normal. There is no distension.     Palpations: Abdomen is soft.     Tenderness: There is no abdominal tenderness.     Comments: History of colectomy   Genitourinary:     Comments: History of turp Musculoskeletal:     Right lower leg: No edema.     Left lower leg: No edema.     Comments:  Is able to move all extremities History of right femur ORIF     Lymphadenopathy:     Cervical: No cervical adenopathy.  Skin:    General: Skin is warm and dry.     Comments: Right upper extremity a/v fistula: + thrill + bruit      Neurological:     Mental Status: He is alert. Mental status is at baseline.  Psychiatric:        Mood and Affect: Mood normal.       ASSESSMENT/ PLAN:  TODAY  1. End stage renal disease on hemodialysis due to type 2 diabetes mellitus: is stable will continue dialysis 3 days weekly is followed by nephrology. Will continue midodrine 15 mg with dialysis 1200 cc fluid restriction; renevala 1600 mg with meals and 800 mg with snacks.   2. Other specified hypothyroidism: is stable tsh 2.752; will continue synthroid 88 mcg daily  3. Anemia of chronic disease on chronic dialysis is stable hgb 10.8 will monitor   4. Major depression single episode moderate: is worse will begin zoloft 50 mg daily and will monitor his status.   PREVIOUS   5. Bilateral lower extremity edema will continue demadex 50 mg daily  6. Orthostatic hypotension: is stable b/p 96/60 will continue midodrein 15 mg with dialysis   7. GERD without esophagitis: is stable will continue prilosec 40 mg daily  8. Uncontrolled type 2 diabetes mellitus with end  stage renal disease: stable  hgb a1c 7.6 will continue novolog 13 units with meals; januvia 25 mg daily  basaglar 30 units nightly   9. Dyslipidemia associated with type 2 diabetes mellitus: is stable trig 439; will continue lipitor 80 mg daily and fish oil 2 gm  daily     MD is aware of resident's narcotic use and is in agreement with current plan of care. We will attempt to wean resident as appropriate.  Ok Edwards NP [facility]  Contact [phone] Monday through Friday 8am- 5pm  After hours call  [phone]

## 2019-08-05 DIAGNOSIS — R110 Nausea: Secondary | ICD-10-CM | POA: Diagnosis not present

## 2019-08-05 DIAGNOSIS — N2581 Secondary hyperparathyroidism of renal origin: Secondary | ICD-10-CM | POA: Diagnosis not present

## 2019-08-05 DIAGNOSIS — N186 End stage renal disease: Secondary | ICD-10-CM | POA: Diagnosis not present

## 2019-08-05 DIAGNOSIS — Z992 Dependence on renal dialysis: Secondary | ICD-10-CM | POA: Diagnosis not present

## 2019-08-05 DIAGNOSIS — D509 Iron deficiency anemia, unspecified: Secondary | ICD-10-CM | POA: Diagnosis not present

## 2019-08-05 DIAGNOSIS — Z23 Encounter for immunization: Secondary | ICD-10-CM | POA: Diagnosis not present

## 2019-08-08 DIAGNOSIS — Z992 Dependence on renal dialysis: Secondary | ICD-10-CM | POA: Diagnosis not present

## 2019-08-08 DIAGNOSIS — N186 End stage renal disease: Secondary | ICD-10-CM | POA: Diagnosis not present

## 2019-08-08 DIAGNOSIS — D509 Iron deficiency anemia, unspecified: Secondary | ICD-10-CM | POA: Diagnosis not present

## 2019-08-08 DIAGNOSIS — R110 Nausea: Secondary | ICD-10-CM | POA: Diagnosis not present

## 2019-08-08 DIAGNOSIS — E119 Type 2 diabetes mellitus without complications: Secondary | ICD-10-CM | POA: Diagnosis not present

## 2019-08-08 DIAGNOSIS — N2581 Secondary hyperparathyroidism of renal origin: Secondary | ICD-10-CM | POA: Diagnosis not present

## 2019-08-08 DIAGNOSIS — Z23 Encounter for immunization: Secondary | ICD-10-CM | POA: Diagnosis not present

## 2019-08-09 DIAGNOSIS — F321 Major depressive disorder, single episode, moderate: Secondary | ICD-10-CM | POA: Insufficient documentation

## 2019-08-10 DIAGNOSIS — Z992 Dependence on renal dialysis: Secondary | ICD-10-CM | POA: Diagnosis not present

## 2019-08-10 DIAGNOSIS — Z23 Encounter for immunization: Secondary | ICD-10-CM | POA: Diagnosis not present

## 2019-08-10 DIAGNOSIS — N2581 Secondary hyperparathyroidism of renal origin: Secondary | ICD-10-CM | POA: Diagnosis not present

## 2019-08-10 DIAGNOSIS — N186 End stage renal disease: Secondary | ICD-10-CM | POA: Diagnosis not present

## 2019-08-10 DIAGNOSIS — R110 Nausea: Secondary | ICD-10-CM | POA: Diagnosis not present

## 2019-08-10 DIAGNOSIS — D509 Iron deficiency anemia, unspecified: Secondary | ICD-10-CM | POA: Diagnosis not present

## 2019-08-12 DIAGNOSIS — N186 End stage renal disease: Secondary | ICD-10-CM | POA: Diagnosis not present

## 2019-08-12 DIAGNOSIS — Z992 Dependence on renal dialysis: Secondary | ICD-10-CM | POA: Diagnosis not present

## 2019-08-12 DIAGNOSIS — Z23 Encounter for immunization: Secondary | ICD-10-CM | POA: Diagnosis not present

## 2019-08-12 DIAGNOSIS — R110 Nausea: Secondary | ICD-10-CM | POA: Diagnosis not present

## 2019-08-12 DIAGNOSIS — D509 Iron deficiency anemia, unspecified: Secondary | ICD-10-CM | POA: Diagnosis not present

## 2019-08-12 DIAGNOSIS — N2581 Secondary hyperparathyroidism of renal origin: Secondary | ICD-10-CM | POA: Diagnosis not present

## 2019-08-15 DIAGNOSIS — N2581 Secondary hyperparathyroidism of renal origin: Secondary | ICD-10-CM | POA: Diagnosis not present

## 2019-08-15 DIAGNOSIS — R110 Nausea: Secondary | ICD-10-CM | POA: Diagnosis not present

## 2019-08-15 DIAGNOSIS — Z23 Encounter for immunization: Secondary | ICD-10-CM | POA: Diagnosis not present

## 2019-08-15 DIAGNOSIS — D509 Iron deficiency anemia, unspecified: Secondary | ICD-10-CM | POA: Diagnosis not present

## 2019-08-15 DIAGNOSIS — N186 End stage renal disease: Secondary | ICD-10-CM | POA: Diagnosis not present

## 2019-08-15 DIAGNOSIS — Z992 Dependence on renal dialysis: Secondary | ICD-10-CM | POA: Diagnosis not present

## 2019-08-17 ENCOUNTER — Other Ambulatory Visit: Payer: Self-pay | Admitting: Adult Health

## 2019-08-17 DIAGNOSIS — N186 End stage renal disease: Secondary | ICD-10-CM | POA: Diagnosis not present

## 2019-08-17 DIAGNOSIS — D509 Iron deficiency anemia, unspecified: Secondary | ICD-10-CM | POA: Diagnosis not present

## 2019-08-17 DIAGNOSIS — R110 Nausea: Secondary | ICD-10-CM | POA: Diagnosis not present

## 2019-08-17 DIAGNOSIS — Z23 Encounter for immunization: Secondary | ICD-10-CM | POA: Diagnosis not present

## 2019-08-17 DIAGNOSIS — N2581 Secondary hyperparathyroidism of renal origin: Secondary | ICD-10-CM | POA: Diagnosis not present

## 2019-08-17 DIAGNOSIS — Z992 Dependence on renal dialysis: Secondary | ICD-10-CM | POA: Diagnosis not present

## 2019-08-19 DIAGNOSIS — N2581 Secondary hyperparathyroidism of renal origin: Secondary | ICD-10-CM | POA: Diagnosis not present

## 2019-08-19 DIAGNOSIS — L603 Nail dystrophy: Secondary | ICD-10-CM | POA: Diagnosis not present

## 2019-08-19 DIAGNOSIS — N186 End stage renal disease: Secondary | ICD-10-CM | POA: Diagnosis not present

## 2019-08-19 DIAGNOSIS — M2042 Other hammer toe(s) (acquired), left foot: Secondary | ICD-10-CM | POA: Diagnosis not present

## 2019-08-19 DIAGNOSIS — Z992 Dependence on renal dialysis: Secondary | ICD-10-CM | POA: Diagnosis not present

## 2019-08-19 DIAGNOSIS — E1151 Type 2 diabetes mellitus with diabetic peripheral angiopathy without gangrene: Secondary | ICD-10-CM | POA: Diagnosis not present

## 2019-08-19 DIAGNOSIS — Z794 Long term (current) use of insulin: Secondary | ICD-10-CM | POA: Diagnosis not present

## 2019-08-19 DIAGNOSIS — M2041 Other hammer toe(s) (acquired), right foot: Secondary | ICD-10-CM | POA: Diagnosis not present

## 2019-08-19 DIAGNOSIS — D509 Iron deficiency anemia, unspecified: Secondary | ICD-10-CM | POA: Diagnosis not present

## 2019-08-19 DIAGNOSIS — B351 Tinea unguium: Secondary | ICD-10-CM | POA: Diagnosis not present

## 2019-08-19 DIAGNOSIS — E1140 Type 2 diabetes mellitus with diabetic neuropathy, unspecified: Secondary | ICD-10-CM | POA: Diagnosis not present

## 2019-08-19 DIAGNOSIS — R110 Nausea: Secondary | ICD-10-CM | POA: Diagnosis not present

## 2019-08-19 DIAGNOSIS — Z23 Encounter for immunization: Secondary | ICD-10-CM | POA: Diagnosis not present

## 2019-08-19 LAB — HM DIABETES FOOT EXAM

## 2019-08-22 DIAGNOSIS — N186 End stage renal disease: Secondary | ICD-10-CM | POA: Diagnosis not present

## 2019-08-22 DIAGNOSIS — Z23 Encounter for immunization: Secondary | ICD-10-CM | POA: Diagnosis not present

## 2019-08-22 DIAGNOSIS — D509 Iron deficiency anemia, unspecified: Secondary | ICD-10-CM | POA: Diagnosis not present

## 2019-08-22 DIAGNOSIS — N2581 Secondary hyperparathyroidism of renal origin: Secondary | ICD-10-CM | POA: Diagnosis not present

## 2019-08-22 DIAGNOSIS — R110 Nausea: Secondary | ICD-10-CM | POA: Diagnosis not present

## 2019-08-22 DIAGNOSIS — Z992 Dependence on renal dialysis: Secondary | ICD-10-CM | POA: Diagnosis not present

## 2019-08-24 DIAGNOSIS — R110 Nausea: Secondary | ICD-10-CM | POA: Diagnosis not present

## 2019-08-24 DIAGNOSIS — D509 Iron deficiency anemia, unspecified: Secondary | ICD-10-CM | POA: Diagnosis not present

## 2019-08-24 DIAGNOSIS — Z992 Dependence on renal dialysis: Secondary | ICD-10-CM | POA: Diagnosis not present

## 2019-08-24 DIAGNOSIS — N2581 Secondary hyperparathyroidism of renal origin: Secondary | ICD-10-CM | POA: Diagnosis not present

## 2019-08-24 DIAGNOSIS — Z23 Encounter for immunization: Secondary | ICD-10-CM | POA: Diagnosis not present

## 2019-08-24 DIAGNOSIS — N186 End stage renal disease: Secondary | ICD-10-CM | POA: Diagnosis not present

## 2019-08-26 DIAGNOSIS — Z992 Dependence on renal dialysis: Secondary | ICD-10-CM | POA: Diagnosis not present

## 2019-08-26 DIAGNOSIS — D509 Iron deficiency anemia, unspecified: Secondary | ICD-10-CM | POA: Diagnosis not present

## 2019-08-26 DIAGNOSIS — R110 Nausea: Secondary | ICD-10-CM | POA: Diagnosis not present

## 2019-08-26 DIAGNOSIS — N2581 Secondary hyperparathyroidism of renal origin: Secondary | ICD-10-CM | POA: Diagnosis not present

## 2019-08-26 DIAGNOSIS — N186 End stage renal disease: Secondary | ICD-10-CM | POA: Diagnosis not present

## 2019-08-26 DIAGNOSIS — Z23 Encounter for immunization: Secondary | ICD-10-CM | POA: Diagnosis not present

## 2019-08-27 DIAGNOSIS — N186 End stage renal disease: Secondary | ICD-10-CM | POA: Diagnosis not present

## 2019-08-27 DIAGNOSIS — Z992 Dependence on renal dialysis: Secondary | ICD-10-CM | POA: Diagnosis not present

## 2019-08-29 DIAGNOSIS — Z992 Dependence on renal dialysis: Secondary | ICD-10-CM | POA: Diagnosis not present

## 2019-08-29 DIAGNOSIS — R1110 Vomiting, unspecified: Secondary | ICD-10-CM | POA: Diagnosis not present

## 2019-08-29 DIAGNOSIS — D631 Anemia in chronic kidney disease: Secondary | ICD-10-CM | POA: Diagnosis not present

## 2019-08-29 DIAGNOSIS — N186 End stage renal disease: Secondary | ICD-10-CM | POA: Diagnosis not present

## 2019-08-29 DIAGNOSIS — N2581 Secondary hyperparathyroidism of renal origin: Secondary | ICD-10-CM | POA: Diagnosis not present

## 2019-08-29 DIAGNOSIS — D509 Iron deficiency anemia, unspecified: Secondary | ICD-10-CM | POA: Diagnosis not present

## 2019-08-29 DIAGNOSIS — R110 Nausea: Secondary | ICD-10-CM | POA: Diagnosis not present

## 2019-08-31 DIAGNOSIS — N186 End stage renal disease: Secondary | ICD-10-CM | POA: Diagnosis not present

## 2019-08-31 DIAGNOSIS — Z992 Dependence on renal dialysis: Secondary | ICD-10-CM | POA: Diagnosis not present

## 2019-08-31 DIAGNOSIS — D509 Iron deficiency anemia, unspecified: Secondary | ICD-10-CM | POA: Diagnosis not present

## 2019-08-31 DIAGNOSIS — N2581 Secondary hyperparathyroidism of renal origin: Secondary | ICD-10-CM | POA: Diagnosis not present

## 2019-08-31 DIAGNOSIS — R110 Nausea: Secondary | ICD-10-CM | POA: Diagnosis not present

## 2019-08-31 DIAGNOSIS — R1110 Vomiting, unspecified: Secondary | ICD-10-CM | POA: Diagnosis not present

## 2019-09-01 ENCOUNTER — Non-Acute Institutional Stay (SKILLED_NURSING_FACILITY): Payer: Medicare Other | Admitting: Adult Health

## 2019-09-01 ENCOUNTER — Encounter: Payer: Self-pay | Admitting: Adult Health

## 2019-09-01 DIAGNOSIS — K219 Gastro-esophageal reflux disease without esophagitis: Secondary | ICD-10-CM

## 2019-09-01 DIAGNOSIS — I951 Orthostatic hypotension: Secondary | ICD-10-CM

## 2019-09-01 DIAGNOSIS — R600 Localized edema: Secondary | ICD-10-CM

## 2019-09-01 NOTE — Progress Notes (Signed)
Location:    [facility] Room Number: 112/D Place of Service:  SNF (31)   CODE STATUS: Full Code  No Known Allergies  Chief Complaint  Patient presents with   Medical Management of Chronic Issues        Bilateral lower extremity edema:. Orthostatic hypotension:   GERD without esophagitis:     HPI:  He is a 73 year old long term resident of this facility being seen for the management of his chronic illnesses: edema; hypotension; gerd. He denies any heart burn; no uncontrolled pain; no insomnia or anxiety.   Past Medical History:  Diagnosis Date   Abnormal CT scan, kidney 10/06/2011   Acute pyelonephritis 10/07/2011   Anemia    normocytic   Anxiety    mental retardation   Bladder wall thickening 10/06/2011   BPH (benign prostatic hypertrophy)    Diabetes mellitus    Dialysis patient ([place])    Tuesday, Thursday and Saturday,    DVT of leg (deep venous thrombosis) ([place]) 12/25/2016   Edema     history of lower extremity edema   GERD (gastroesophageal reflux disease)    Heme positive stool    Hydronephrosis    Hyperkalemia    Hyperlipidemia    Hypernatremia    Hypertension    Hypothyroidism    Impaired speech    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[place])    MR (mental retardation)    Muscle weakness    Obstructive uropathy    Perinephric abscess 10/07/2011   Poor historian poor historian   Protein calorie malnutrition ([place])    Pyelonephritis    Renal failure (ARF), acute on chronic (HCC)    Renal insufficiency    chronic history   Sepsis ([place])    Smoking    Uremia    Urinary retention    UTI (lower urinary tract infection) 10/06/2011    Past Surgical History:  Procedure Laterality Date   A/V FISTULAGRAM N/A 08/13/2018   Procedure: A/V FISTULAGRAM - Right Upper;  Surgeon: Elam Dutch, MD;  Location: [facility];  Service: Cardiovascular;  Laterality: N/A;   A/V FISTULAGRAM N/A 11/22/2018   Procedure: A/V FISTULAGRAM - Right Upper;  Surgeon: Waynetta Sandy, MD;  Location: [facility];  Service: Cardiovascular;  Laterality: N/A;   AV FISTULA PLACEMENT Left 07/06/2015   Procedure:  INSERTION LEFT ARM ARTERIOVENOUS GORTEX GRAFT;  Surgeon: Angelia Mould, MD;  Location: [facility];  Service: Vascular;  Laterality: Left;   AV FISTULA PLACEMENT Right 02/26/2016   Procedure: ARTERIOVENOUS (AV) FISTULA CREATION ;  Surgeon: Angelia Mould, MD;  Location: [facility];  Service: Vascular;  Laterality: Right;   AV FISTULA PLACEMENT Right 11/25/2018   Procedure: INSERTION OF ARTERIOVENOUS (AV) ARTEGRAFT RIGHT UPPER ARM;  Surgeon: Waynetta Sandy, MD;  Location: [facility];  Service: Vascular;  Laterality: Right;   [place] REMOVAL Left 10/09/2015   Procedure: REMOVAL OF ARTERIOVENOUS GORETEX GRAFT ([place]) Evacuation of Lymphocele, Vein Patch angioplasty of brachial artery.;  Surgeon: Angelia Mould, MD;  Location: [place];  Service: Vascular;  Laterality: Left;   [place] Right 02/26/2016   Procedure: Right [place];  Surgeon: Angelia Mould, MD;  Location: [facility];  Service: Vascular;  Laterality: Right;   CIRCUMCISION N/A 01/04/2014   Procedure: CIRCUMCISION ADULT (procedure #1);  Surgeon: Marissa Nestle, MD;  Location: [facility];  Service: Urology;  Laterality: N/A;   COLECTOMY N/A 05/04/2017  Procedure: TOTAL COLECTOMY;  Surgeon: Aviva Signs, MD;  Location: [facility];  Service: General;  Laterality: N/A;   COLONOSCOPY N/A 04/27/2017   Procedure: COLONOSCOPY;  Surgeon: Daneil Dolin, MD;  [facility];  Service: Endoscopy;  Laterality: N/A;  245   CYSTOSCOPY W/ RETROGRADES Bilateral 06/29/2015   Procedure: CYSTOSCOPY, DILATION OF URETHRAL STRICTURE WITH BILATERAL RETROGRADE PYELOGRAM,SUPRAPUBIC TUBE CHANGE;  Surgeon: Festus Aloe, MD;  Location: [facility];  Service: Urology;  Laterality: Bilateral;   CYSTOSCOPY WITH  URETHRAL DILATATION N/A 12/29/2013   Procedure: CYSTOSCOPY WITH URETHRAL DILATATION;  Surgeon: Marissa Nestle, MD;  [facility];  Service: Urology;  Laterality: N/A;   ESOPHAGOGASTRODUODENOSCOPY N/A 04/27/2017   Procedure: ESOPHAGOGASTRODUODENOSCOPY (EGD);  Surgeon: Daneil Dolin, MD;  [facility];  Service: Endoscopy;  Laterality: N/A;   INSERTION OF DIALYSIS CATHETER Right 11/25/2018   Procedure: INSERTION OF DIALYSIS CATHETER RIGHT INTERNAL JUGULAR;  Surgeon: Waynetta Sandy, MD;  Location: [facility] OR;  Service: Vascular;  Laterality: Right;   IR AV DIALY SHUNT INTRO NEEDLE/INTRACATH INITIAL W/PTA/IMG RIGHT Right 09/07/2018   IR REMOVAL TUN CV CATH W/O FL  01/12/2019   IR THROMBECTOMY AV FISTULA W/THROMBOLYSIS/PTA INC/SHUNT/IMG RIGHT Right 04/26/2018   IR US GUIDE VASC ACCESS RIGHT  04/26/2018   IR US GUIDE VASC ACCESS RIGHT  09/07/2018   ORIF FEMUR FRACTURE Right 11/22/2016   Procedure: OPEN REDUCTION INTERNAL FIXATION (ORIF) DISTAL FEMUR FRACTURE;  Surgeon: Rod Can, MD;  Location: [facility];  Service: Orthopedics;  Laterality: Right;   PATCH ANGIOPLASTY Right 12/10/2017   Procedure: PATCH ANGIOPLASTY;  Surgeon: Angelia Mould, MD;  Location: [facility];  Service: Vascular;  Laterality: Right;   PERIPHERAL VASCULAR BALLOON ANGIOPLASTY  08/13/2018   Procedure: PERIPHERAL VASCULAR BALLOON ANGIOPLASTY;  Surgeon: Elam Dutch, MD;  Location: [facility];  Service: Cardiovascular;;  right AV fistula    PERIPHERAL VASCULAR BALLOON ANGIOPLASTY  11/22/2018   Procedure: PERIPHERAL VASCULAR BALLOON ANGIOPLASTY;  Surgeon: Waynetta Sandy, MD;  Location: [facility];  Service: Cardiovascular;;  rt AV fistula   PERIPHERAL VASCULAR CATHETERIZATION N/A 10/08/2015   Procedure: A/V Shuntogram;  Surgeon: Angelia Mould, MD;  Location: [facility];  Service: Cardiovascular;  Laterality: N/A;   THROMBECTOMY W/ EMBOLECTOMY Right  12/10/2017   Procedure: THROMBECTOMY REVISION RIGHT ARM  ARTERIOVENOUS FISTULA;  Surgeon: Angelia Mould, MD;  Location: [facility] OR;  Service: Vascular;  Laterality: Right;   TRANSURETHRAL RESECTION OF PROSTATE N/A 01/04/2014   Procedure: TRANSURETHRAL RESECTION OF THE PROSTATE (TURP) (procedure #2);  Surgeon: Marissa Nestle, MD;  [facility];  Service: Urology;  Laterality: N/A;    Social History   Socioeconomic History   Marital status: Single    Spouse name: Not on file   Number of children: Not on file   Years of education: Not on file   Highest education level: Not on file  Occupational History   Not on file  Social Needs   Financial resource strain: Not hard at all   Food insecurity    Worry: Never true    Inability: Never true   Transportation needs    Medical: No    Non-medical: No  Tobacco Use   Smoking status: Never Smoker   Smokeless tobacco: Never Used  Substance and Sexual Activity   Alcohol use: No   Drug use: No   Sexual activity: Never  Lifestyle   Physical activity    Days per  week: 0 days    Minutes per session: Not on file   Stress: Not at all  Relationships   Social connections    Talks on phone: More than three times a week    Gets together: More than three times a week    Attends religious service: Never    Active member of club or organization: No    Attends meetings of clubs or organizations: Never    Relationship status: Never married   Intimate partner violence    Fear of current or ex partner: No    Emotionally abused: No    Physically abused: No    Forced sexual activity: No  Other Topics Concern   Not on file  Social History Narrative   Lives at nursing home.   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   Family History  Problem Relation Age of Onset   Cancer Mother    Colon cancer Neg Hx       VITAL SIGNS BP 91/60    Pulse 86    Temp (!) 96.7 F (35.9 C) (Oral)     Resp 19    Ht 5\' 8"  (1.727 m)    Wt 213 lb (96.6 kg)    SpO2 100%    BMI 32.39 kg/m   Outpatient Encounter Medications as of 09/01/2019  Medication Sig   atorvastatin (LIPITOR) 80 MG tablet Take 80 mg by mouth every evening.   insulin aspart (NOVOLOG FLEXPEN) 100 UNIT/ML FlexPen Inject 13 Units into the skin 3 (three) times daily with meals.    Insulin Glargine (BASAGLAR KWIKPEN) 100 UNIT/ML SOPN Inject 30 Units into the skin at bedtime.    JANUVIA 25 MG tablet Take 25 mg by mouth daily.    levothyroxine (SYNTHROID, LEVOTHROID) 88 MCG tablet Take 88 mcg by mouth daily before breakfast.   midodrine (PROAMATINE) 10 MG tablet Take 10 mg by mouth. Send med with resident on Mon/Wed/Fri to dialysis. do not give [facility] give to resident to take with him. send with 5mg  to equal 15mg .   midodrine (PROAMATINE) 5 MG tablet Take 5 mg by mouth. Send with resident on dialysis days.  Mon, Wed, Fri.  Take along with 10 mg to equal 15 mg   Multiple Vitamin (MULTIVITAMIN WITH MINERALS) TABS tablet Take 1 tablet by mouth every evening.    NON FORMULARY Fluid Restriction - 1200 cc a day 500 cc for days, Evenings 500 cc, Night 200 cc   NON FORMULARY Diet Type:  NAS, Cons CHO Diet   Nutritional Supplements (NEPRO/CARBSTEADY PO) Take 1 Can by mouth 2 (two) times daily.   Omega-3 Fatty Acids (FISH OIL PO) Take 2 capsules by mouth at bedtime.    omeprazole (PRILOSEC) 40 MG capsule Take 40 mg by mouth at bedtime.    polyethylene glycol (MIRALAX / GLYCOLAX) packet Take 17 g by mouth as needed for mild constipation.    sertraline (ZOLOFT) 50 MG tablet Take 50 mg by mouth daily.   sevelamer carbonate (RENVELA) 800 MG tablet Take 1,600 mg by mouth 3 (three) times daily.    sevelamer carbonate (RENVELA) 800 MG tablet Take 800 mg by mouth daily. With snacks   tamsulosin (FLOMAX) 0.4 MG CAPS capsule Take 0.4 mg by mouth every evening. Give 30 minutes after a meal   torsemide (DEMADEX) 10 MG tablet  Take 5 tablets (50 mg total) by mouth daily.   No facility-administered encounter medications on file as of 09/01/2019.  SIGNIFICANT DIAGNOSTIC EXAMS   LABS REVIEWED PREVIOUS;  12-06-18: tsh 2.752 01-25-19: wbc 5.6; hgb 12.9 hct 40.8; mcv 110.3; plt 146; glucose 334; bun 25; creat 6.78; k+ 4.7; na++ 137; ca 9.0 liver normal albumin 4.0  Hgb a1c 6.9; chol 94 trig 439 LDL direct 16.3 05-10-19 hgb a1c 7.6   06-03-19: wbc 6.3; hgb 10.8; hct 33.4; mcv 106.7; plt 132; glucose 160; bun 30; creat 8.46; k+ 4.6; na++ 137; ca 9.1; liver normal albumin 3.6; chol 115; LDL 25.5; trig 531; hdl 24; PSA 2.73  NO NEW LABS.   Review of Systems  Constitutional: Negative for malaise/fatigue.  Respiratory: Negative for cough and shortness of breath.   Cardiovascular: Negative for chest pain, palpitations and leg swelling.  Gastrointestinal: Negative for abdominal pain, constipation and heartburn.  Musculoskeletal: Negative for back pain, joint pain and myalgias.  Skin: Negative.   Neurological: Negative for dizziness.  Psychiatric/Behavioral: The patient is not nervous/anxious.      Physical Exam Constitutional:      General: He is not in acute distress.    Appearance: He is well-developed. He is not diaphoretic.  Neck:     Musculoskeletal: Neck supple.     Thyroid: No thyromegaly.  Cardiovascular:     Rate and Rhythm: Normal rate and regular rhythm.     Pulses: Normal pulses.     Heart sounds: Normal heart sounds.  Pulmonary:     Effort: Pulmonary effort is normal. No respiratory distress.     Breath sounds: Normal breath sounds.  Abdominal:     General: Bowel sounds are normal. There is no distension.     Palpations: Abdomen is soft.     Tenderness: There is no abdominal tenderness.     Comments: History of colectomy    Genitourinary:    Comments: History of turp Musculoskeletal:     Right lower leg: No edema.     Left lower leg: No edema.     Comments:  Is able to move all  extremities History of right femur ORIF      Lymphadenopathy:     Cervical: No cervical adenopathy.  Skin:    General: Skin is warm and dry.     Comments:  Right upper extremity a/v fistula: + thrill + bruit       Neurological:     Mental Status: He is alert. Mental status is at baseline.  Psychiatric:        Mood and Affect: Mood normal.      ASSESSMENT/ PLAN:  TODAY  1. Bilateral lower extremity edema: stable will continue demadex 50 mg daily   2. Orthostatic hypotension: is stable 91/60 will continue midodrine 15 mg with dialysis   3. GERD without esophagitis: is stable will continue prilosec 40 mg daily    PREVIOUS   4. 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 Dyslipidemia associated with type 2 diabetes mellitus: is stable trig 439; will continue lipitor 80 mg daily and fish oil 2 gm  daily   6. End stage renal disease on hemodialysis due to type 2 diabetes mellitus: is stable will continue dialysis 3 days weekly is followed by nephrology. Will continue midodrine 15 mg with dialysis 1200 cc fluid restriction; renevala 1600 mg with meals and 800 mg with snacks.   7. Other specified hypothyroidism: is stable tsh 2.752;  will continue synthroid 88 mcg daily  8. Anemia of chronic disease on chronic dialysis is stable hgb 10.8 will monitor   9. Major depression single episode moderate: is stable will continue zoloft 50 mg daily and will monitor his status.       MD is aware of resident's narcotic use and is in agreement with current plan of care. We will attempt to wean resident as appropriate.  Ok Edwards NP [facility]  Contact [phone] Monday through Friday 8am- 5pm  After hours call [phone]

## 2019-09-02 ENCOUNTER — Encounter (HOSPITAL_COMMUNITY)
Admission: RE | Admit: 2019-09-02 | Discharge: 2019-09-02 | Disposition: A | Discharge status: Home / Self Care | Payer: Medicare Other | Source: Skilled Nursing Facility | Attending: Adult Health | Admitting: Adult Health

## 2019-09-02 DIAGNOSIS — N186 End stage renal disease: Secondary | ICD-10-CM | POA: Diagnosis not present

## 2019-09-02 DIAGNOSIS — D509 Iron deficiency anemia, unspecified: Secondary | ICD-10-CM | POA: Diagnosis not present

## 2019-09-02 DIAGNOSIS — N2581 Secondary hyperparathyroidism of renal origin: Secondary | ICD-10-CM | POA: Diagnosis not present

## 2019-09-02 DIAGNOSIS — E1129 Type 2 diabetes mellitus with other diabetic kidney complication: Secondary | ICD-10-CM | POA: Insufficient documentation

## 2019-09-02 DIAGNOSIS — F4322 Adjustment disorder with anxiety: Secondary | ICD-10-CM | POA: Diagnosis not present

## 2019-09-02 DIAGNOSIS — I120 Hypertensive chronic kidney disease with stage 5 chronic kidney disease or end stage renal disease: Secondary | ICD-10-CM | POA: Insufficient documentation

## 2019-09-02 DIAGNOSIS — R110 Nausea: Secondary | ICD-10-CM | POA: Diagnosis not present

## 2019-09-02 DIAGNOSIS — Z992 Dependence on renal dialysis: Secondary | ICD-10-CM | POA: Diagnosis not present

## 2019-09-02 DIAGNOSIS — R1110 Vomiting, unspecified: Secondary | ICD-10-CM | POA: Diagnosis not present

## 2019-09-02 DIAGNOSIS — F70 Mild intellectual disabilities: Secondary | ICD-10-CM | POA: Diagnosis not present

## 2019-09-02 DIAGNOSIS — R262 Difficulty in walking, not elsewhere classified: Secondary | ICD-10-CM | POA: Insufficient documentation

## 2019-09-05 DIAGNOSIS — Z794 Long term (current) use of insulin: Secondary | ICD-10-CM | POA: Diagnosis not present

## 2019-09-05 DIAGNOSIS — H524 Presbyopia: Secondary | ICD-10-CM | POA: Diagnosis not present

## 2019-09-05 DIAGNOSIS — Z992 Dependence on renal dialysis: Secondary | ICD-10-CM | POA: Diagnosis not present

## 2019-09-05 DIAGNOSIS — R110 Nausea: Secondary | ICD-10-CM | POA: Diagnosis not present

## 2019-09-05 DIAGNOSIS — H2513 Age-related nuclear cataract, bilateral: Secondary | ICD-10-CM | POA: Diagnosis not present

## 2019-09-05 DIAGNOSIS — N186 End stage renal disease: Secondary | ICD-10-CM | POA: Diagnosis not present

## 2019-09-05 DIAGNOSIS — D509 Iron deficiency anemia, unspecified: Secondary | ICD-10-CM | POA: Diagnosis not present

## 2019-09-05 DIAGNOSIS — R1110 Vomiting, unspecified: Secondary | ICD-10-CM | POA: Diagnosis not present

## 2019-09-05 DIAGNOSIS — N2581 Secondary hyperparathyroidism of renal origin: Secondary | ICD-10-CM | POA: Diagnosis not present

## 2019-09-05 DIAGNOSIS — E113293 Type 2 diabetes mellitus with mild nonproliferative diabetic retinopathy without macular edema, bilateral: Secondary | ICD-10-CM | POA: Diagnosis not present

## 2019-09-05 DIAGNOSIS — H40013 Open angle with borderline findings, low risk, bilateral: Secondary | ICD-10-CM | POA: Diagnosis not present

## 2019-09-07 ENCOUNTER — Other Ambulatory Visit (HOSPITAL_COMMUNITY)
Admission: RE | Admit: 2019-09-07 | Discharge: 2019-09-07 | Disposition: A | Discharge status: Home / Self Care | Payer: Medicare Other | Source: Skilled Nursing Facility | Attending: Adult Health | Admitting: Adult Health

## 2019-09-07 DIAGNOSIS — N186 End stage renal disease: Secondary | ICD-10-CM | POA: Diagnosis not present

## 2019-09-07 DIAGNOSIS — R110 Nausea: Secondary | ICD-10-CM | POA: Diagnosis not present

## 2019-09-07 DIAGNOSIS — E1129 Type 2 diabetes mellitus with other diabetic kidney complication: Secondary | ICD-10-CM | POA: Diagnosis not present

## 2019-09-07 DIAGNOSIS — D509 Iron deficiency anemia, unspecified: Secondary | ICD-10-CM | POA: Diagnosis not present

## 2019-09-07 DIAGNOSIS — N2581 Secondary hyperparathyroidism of renal origin: Secondary | ICD-10-CM | POA: Diagnosis not present

## 2019-09-07 DIAGNOSIS — R1110 Vomiting, unspecified: Secondary | ICD-10-CM | POA: Diagnosis not present

## 2019-09-07 DIAGNOSIS — Z992 Dependence on renal dialysis: Secondary | ICD-10-CM | POA: Diagnosis not present

## 2019-09-07 LAB — HEMOGLOBIN A1C
Hgb A1c MFr Bld: 7.5 % — ABNORMAL HIGH (ref 4.8–5.6)
Mean Plasma Glucose: 168.55 mg/dL

## 2019-09-09 DIAGNOSIS — D509 Iron deficiency anemia, unspecified: Secondary | ICD-10-CM | POA: Diagnosis not present

## 2019-09-09 DIAGNOSIS — R110 Nausea: Secondary | ICD-10-CM | POA: Diagnosis not present

## 2019-09-09 DIAGNOSIS — R1110 Vomiting, unspecified: Secondary | ICD-10-CM | POA: Diagnosis not present

## 2019-09-09 DIAGNOSIS — Z992 Dependence on renal dialysis: Secondary | ICD-10-CM | POA: Diagnosis not present

## 2019-09-09 DIAGNOSIS — N2581 Secondary hyperparathyroidism of renal origin: Secondary | ICD-10-CM | POA: Diagnosis not present

## 2019-09-09 DIAGNOSIS — N186 End stage renal disease: Secondary | ICD-10-CM | POA: Diagnosis not present

## 2019-09-12 DIAGNOSIS — N186 End stage renal disease: Secondary | ICD-10-CM | POA: Diagnosis not present

## 2019-09-12 DIAGNOSIS — D509 Iron deficiency anemia, unspecified: Secondary | ICD-10-CM | POA: Diagnosis not present

## 2019-09-12 DIAGNOSIS — R110 Nausea: Secondary | ICD-10-CM | POA: Diagnosis not present

## 2019-09-12 DIAGNOSIS — R1110 Vomiting, unspecified: Secondary | ICD-10-CM | POA: Diagnosis not present

## 2019-09-12 DIAGNOSIS — Z992 Dependence on renal dialysis: Secondary | ICD-10-CM | POA: Diagnosis not present

## 2019-09-12 DIAGNOSIS — N2581 Secondary hyperparathyroidism of renal origin: Secondary | ICD-10-CM | POA: Diagnosis not present

## 2019-09-14 DIAGNOSIS — Z992 Dependence on renal dialysis: Secondary | ICD-10-CM | POA: Diagnosis not present

## 2019-09-14 DIAGNOSIS — N186 End stage renal disease: Secondary | ICD-10-CM | POA: Diagnosis not present

## 2019-09-14 DIAGNOSIS — R1110 Vomiting, unspecified: Secondary | ICD-10-CM | POA: Diagnosis not present

## 2019-09-14 DIAGNOSIS — N2581 Secondary hyperparathyroidism of renal origin: Secondary | ICD-10-CM | POA: Diagnosis not present

## 2019-09-14 DIAGNOSIS — R110 Nausea: Secondary | ICD-10-CM | POA: Diagnosis not present

## 2019-09-14 DIAGNOSIS — D509 Iron deficiency anemia, unspecified: Secondary | ICD-10-CM | POA: Diagnosis not present

## 2019-09-15 ENCOUNTER — Encounter: Payer: Self-pay | Admitting: Adult Health

## 2019-09-15 ENCOUNTER — Non-Acute Institutional Stay: Payer: Self-pay | Admitting: Adult Health

## 2019-09-15 DIAGNOSIS — Z0000 Encounter for general adult medical examination without abnormal findings: Secondary | ICD-10-CM

## 2019-09-15 DIAGNOSIS — E1122 Type 2 diabetes mellitus with diabetic chronic kidney disease: Secondary | ICD-10-CM

## 2019-09-15 DIAGNOSIS — F321 Major depressive disorder, single episode, moderate: Secondary | ICD-10-CM

## 2019-09-15 DIAGNOSIS — Z992 Dependence on renal dialysis: Secondary | ICD-10-CM

## 2019-09-15 NOTE — Progress Notes (Signed)
Location:  [facility] Room Number: 112/D Place of Service:  SNF (31) Provider: Ok Edwards NP   Patient Care Team: Hennie Duos, MD as PCP - General (Internal Medicine) Cassandria Anger, MD as Consulting Physician (Endocrinology) Fran Lowes, MD as Consulting Physician (Nephrology) Gala Romney Cristopher Estimable, MD as Consulting Physician (Gastroenterology) [facility] [place] ([place]) Gerlene Fee, NP as Nurse Practitioner (Geriatric Medicine)  Extended Emergency Contact Information Primary Emergency Contact: Betsey Amen Address: DSS  [place] Phone: [phone] Work Phone: [phone] Relation: Legal Guardian Secondary Emergency Contact: Ammirati,Richard Address: [address]          [place], [place] [address] [place] Phone: [phone] Relation: Brother  Code Status: full code  Goals of Care: Advanced Directive information Advanced Directives 09/15/2019  Does Patient Have a Medical Advance Directive? Yes  Type of Advance Directive (No Data)  Does patient want to make changes to medical advance directive? No - Patient declined  Copy of [place] in Chart? -  Would patient like information on creating a medical advance directive? -  Pre-existing out of facility DNR order (yellow form or pink MOST form) -     Chief Complaint  Patient presents with  . Medicare Wellness    Annual Wellness Visit    HPI: Patient is a 73 y.o. male seen in today for an annual wellness exam.  He has not been hospitalized in the past year. His weight has remained fairly stable over the past year. He continues with dialysis. He has not had any falls. He denies any uncontrolled pain; no changes in his appetite. He denies any insomnia. He continues to be followed for his chronic illnesses including: dependence on dialysis end stage renal disease and depression.   Depression  screen [facility] 2/9 09/15/2019 06/04/2019 02/18/2018  Decreased Interest 0 0 0  Down, Depressed, Hopeless 0 0 0  PHQ - 2 Score 0 0 0  Some recent data might be hidden    Fall Risk  09/15/2019 06/04/2019 05/26/2019 02/18/2018  Falls in the past year? 0 - (No Data) No  Comment - - [facility]i Telephone Survey: data to providers prior to load -  Number falls in past yr: - 0 (No Data) -  Comment - - Emmi Telephone Survey Actual Response =  -  Risk for fall due to : - Impaired mobility - -     Health Maintenance  Topic Date Due  . OPHTHALMOLOGY EXAM  09/01/2019  . FOOT EXAM  12/29/2019  . HEMOGLOBIN A1C  03/06/2020  . COLONOSCOPY  04/28/2027  . TETANUS/TDAP  08/23/2027  . INFLUENZA VACCINE  Completed  . Hepatitis C Screening  Completed  . PNA vac Low Risk Adult  Completed  . URINE MICROALBUMIN  Discontinued    Functional Status Survey: Is the patient deaf or have difficulty hearing?: No Does the patient have difficulty seeing, even when wearing glasses/contacts?: No Does the patient have difficulty concentrating, remembering, or making decisions?: Yes Does the patient have difficulty walking or climbing stairs?: (long term resident of SNF ) Does the patient have difficulty dressing or bathing?: Yes   Past Medical History:  Diagnosis Date  . Abnormal CT scan, kidney 10/06/2011  . Acute pyelonephritis 10/07/2011  . Anemia    normocytic  . Anxiety    mental retardation  . Bladder wall thickening 10/06/2011  . BPH (benign prostatic hypertrophy)   . Diabetes mellitus   .  Dialysis patient [facility])    Tuesday, Thursday and Saturday,   . DVT of leg (deep venous thrombosis) ([place]) 12/25/2016  . Edema     history of lower extremity edema  . GERD (gastroesophageal reflux disease)   . Heme positive stool   . Hydronephrosis   . Hyperkalemia   . Hyperlipidemia   . Hypernatremia   . Hypertension   . Hypothyroidism   . Impaired speech   . 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 MR (mental  retardation)   . Muscle weakness   . Obstructive uropathy   . 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 Poor historian poor historian  . Protein calorie malnutrition ([place])   . Pyelonephritis   . Renal failure (ARF), acute on chronic (HCC)   . Renal insufficiency    chronic history  . Sepsis ([place])   . Smoking   . Uremia   . Urinary retention   . UTI (lower urinary tract infection) 10/06/2011    Past Surgical History:  Procedure Laterality Date  . A/V FISTULAGRAM N/A 08/13/2018   Procedure: A/V FISTULAGRAM - Right Upper;  Surgeon: Elam Dutch, MD;  Location: [place] CV LAB;  Service: Cardiovascular;  Laterality: N/A;  . A/V FISTULAGRAM N/A 11/22/2018   Procedure: A/V FISTULAGRAM - Right Upper;  Surgeon: Waynetta Sandy, MD;  Location: [employer] CV LAB;  Service: Cardiovascular;  Laterality: N/A;  . AV FISTULA PLACEMENT Left 07/06/2015   Procedure:  INSERTION LEFT ARM ARTERIOVENOUS GORTEX GRAFT;  Surgeon: Angelia Mould, MD;  Location: [place];  Service: Vascular;  Laterality: Left;  . AV FISTULA PLACEMENT Right 02/26/2016   Procedure: ARTERIOVENOUS (AV) FISTULA CREATION ;  Surgeon: Angelia Mould, MD;  Location: [place];  Service: Vascular;  Laterality: Right;  . AV FISTULA PLACEMENT Right 11/25/2018   Procedure: INSERTION OF ARTERIOVENOUS (AV) ARTEGRAFT RIGHT UPPER ARM;  Surgeon: Waynetta Sandy, MD;  Location: [place];  Service: Vascular;  Laterality: Right;  . [place] REMOVAL Left 10/09/2015   Procedure: REMOVAL OF ARTERIOVENOUS GORETEX GRAFT ([place]) Evacuation of Lymphocele, Vein Patch angioplasty of brachial artery.;  Surgeon: Angelia Mould, MD;  Location: [place];  Service: Vascular;  Laterality: Left;  . BASCILIC VEIN TRANSPOSITION Right 02/26/2016   Procedure: Right BASCILIC VEIN TRANSPOSITION;  Surgeon: Angelia Mould, MD;  Location: [place];  Service: Vascular;  Laterality: Right;  . CIRCUMCISION N/A 01/04/2014   Procedure: CIRCUMCISION ADULT  (procedure #1);  Surgeon: Marissa Nestle, MD;  Location[facility];  Service: Urology;  Laterality: N/A;  . COLECTOMY N/A 05/04/2017   Procedure: TOTAL COLECTOMY;  Surgeon: Aviva Signs, MD;  Location[facility];  Service: General;  Laterality: N/A;  . COLONOSCOPY N/A 04/27/2017   Procedure: COLONOSCOPY;  Surgeon: Daneil Dolin, MD;  [facility];  Service: Endoscopy;  Laterality: N/A;  245  . CYSTOSCOPY W/ RETROGRADES Bilateral 06/29/2015   Procedure: CYSTOSCOPY, DILATION OF URETHRAL STRICTURE WITH BILATERAL RETROGRADE PYELOGRAM,SUPRAPUBIC TUBE CHANGE;  Surgeon: Festus Aloe, MD;  Location: [facility];  Service: Urology;  Laterality: Bilateral;  . CYSTOSCOPY WITH URETHRAL DILATATION N/A 12/29/2013   Procedure: CYSTOSCOPY WITH URETHRAL DILATATION;  Surgeon: Marissa Nestle, MD;  [facility];  Service: Urology;  Laterality: N/A;  . ESOPHAGOGASTRODUODENOSCOPY N/A 04/27/2017   Procedure: ESOPHAGOGASTRODUODENOSCOPY (EGD);  Surgeon: Daneil Dolin, MD;  [facility];  Service: Endoscopy;  Laterality: N/A;  . INSERTION OF DIALYSIS CATHETER Right 11/25/2018   Procedure: INSERTION OF DIALYSIS CATHETER  RIGHT INTERNAL JUGULAR;  Surgeon: Waynetta Sandy, MD;  Location: [place];  Service: Vascular;  Laterality: Right;  . IR AV DIALY SHUNT INTRO [place] W/PTA/IMG RIGHT Right 09/07/2018  . IR REMOVAL TUN CV CATH W/O FL  01/12/2019  . IR THROMBECTOMY AV FISTULA W/THROMBOLYSIS/PTA INC/SHUNT/IMG RIGHT Right 04/26/2018  . IR US GUIDE VASC ACCESS RIGHT  04/26/2018  . IR US GUIDE VASC ACCESS RIGHT  09/07/2018  . ORIF FEMUR FRACTURE Right 11/22/2016   Procedure: OPEN REDUCTION INTERNAL FIXATION (ORIF) DISTAL FEMUR FRACTURE;  Surgeon: Rod Can, MD;  Location: [place];  Service: Orthopedics;  Laterality: Right;  . PATCH ANGIOPLASTY Right 12/10/2017   Procedure: PATCH ANGIOPLASTY;  Surgeon: Angelia Mould, MD;  Location: [facility];  Service: Vascular;  Laterality: Right;  .  PERIPHERAL VASCULAR BALLOON ANGIOPLASTY  08/13/2018   Procedure: PERIPHERAL VASCULAR BALLOON ANGIOPLASTY;  Surgeon: Elam Dutch, MD;  Location: [place] CV LAB;  Service: Cardiovascular;;  right AV fistula   . PERIPHERAL VASCULAR BALLOON ANGIOPLASTY  11/22/2018   Procedure: PERIPHERAL VASCULAR BALLOON ANGIOPLASTY;  Surgeon: Waynetta Sandy, MD;  Location: [place] CV LAB;  Service: Cardiovascular;;  rt AV fistula  . PERIPHERAL VASCULAR CATHETERIZATION N/A 10/08/2015   Procedure: A/V Shuntogram;  Surgeon: Angelia Mould, MD;  Location: [place] CV LAB;  Service: Cardiovascular;  Laterality: N/A;  . THROMBECTOMY W/ EMBOLECTOMY Right 12/10/2017   Procedure: THROMBECTOMY REVISION RIGHT ARM  ARTERIOVENOUS FISTULA;  Surgeon: Angelia Mould, MD;  Location: [place];  Service: Vascular;  Laterality: Right;  . TRANSURETHRAL RESECTION OF PROSTATE N/A 01/04/2014   Procedure: TRANSURETHRAL RESECTION OF THE PROSTATE (TURP) (procedure #2);  Surgeon: Marissa Nestle, MD;  [facility];  Service: Urology;  Laterality: N/A;    Family History  Problem Relation Age of Onset  . Cancer Mother   . Colon cancer Neg Hx     Social History   Socioeconomic History  . Marital status: Single    Spouse name: Not on file  . Number of children: Not on file  . Years of education: Not on file  . Highest education level: Not on file  Occupational History  . 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 Financial resource strain: Not hard at all  . Food insecurity    Worry: Never true    Inability: Never true  . Transportation needs    Medical: No    Non-medical: No  Tobacco Use  . Smoking status: Never Smoker  . Smokeless tobacco: Never Used  Substance and Sexual Activity  . Alcohol use: No  . Drug use: No  . Sexual activity: Not Currently  Lifestyle  . Physical activity    Days per week: 0 days    Minutes per session: 0 min  . Stress: Not at all  Relationships  .  Social Herbalist on phone: More than three times a week    Gets together: Never    Attends religious service: Never    Active member of club or organization: No    Attends meetings of clubs or organizations: Never    Relationship status: Never married  Other Topics Concern  . 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   [facility]     reports that he has never smoked. He has never used smokeless tobacco. He reports that he does not drink alcohol or use  drugs.   No Known Allergies  Outpatient Encounter Medications as of 09/15/2019  Medication Sig  . atorvastatin (LIPITOR) 80 MG tablet Take 80 mg by mouth every evening.  . insulin aspart (NOVOLOG FLEXPEN) 100 UNIT/ML FlexPen Inject 13 Units into the skin 3 (three) times daily with meals.   . Insulin Glargine (BASAGLAR KWIKPEN) 100 UNIT/ML SOPN Inject 30 Units into the skin at bedtime.   Marland Kitchen JANUVIA 25 MG tablet Take 25 mg by mouth daily.   Marland Kitchen levothyroxine (SYNTHROID, LEVOTHROID) 88 MCG tablet Take 88 mcg by mouth daily before breakfast.  . midodrine (PROAMATINE) 10 MG tablet Take 10 mg by mouth. Send med with resident on Mon/Wed/Fri to dialysis. do not give [facility] give to resident to take with him. send with 5mg  to equal 15mg .  . midodrine (PROAMATINE) 5 MG tablet Take 5 mg by mouth. Send with resident on dialysis days.  Mon, Wed, Fri.  Take along with 10 mg to equal 15 mg  . Multiple Vitamin (MULTIVITAMIN WITH MINERALS) TABS tablet Take 1 tablet by mouth every evening.   . NON FORMULARY Fluid Restriction - 1200 cc a day 500 cc for days, Evenings 500 cc, Night 200 cc  . NON FORMULARY Diet Type:  NAS, Cons CHO Diet  . Nutritional Supplements (NEPRO/CARBSTEADY PO) Take 1 Can by mouth 2 (two) times daily.  . Omega-3 Fatty Acids (FISH OIL PO) Take 2 capsules by mouth at bedtime.   Marland Kitchen omeprazole (PRILOSEC) 40 MG capsule Take 40 mg by  mouth at bedtime.   . polyethylene glycol (MIRALAX / GLYCOLAX) packet Take 17 g by mouth as needed for mild constipation.   . sertraline (ZOLOFT) 50 MG tablet Take 50 mg by mouth daily.  . sevelamer carbonate (RENVELA) 800 MG tablet Take 1,600 mg by mouth 3 (three) times daily.   . sevelamer carbonate (RENVELA) 800 MG tablet Take 800 mg by mouth daily. With snacks  . tamsulosin (FLOMAX) 0.4 MG CAPS capsule Take 0.4 mg by mouth every evening. Give 30 minutes after a meal  . torsemide (DEMADEX) 10 MG tablet Take 5 tablets (50 mg total) by mouth daily.   No facility-administered encounter medications on file as of 09/15/2019.      Review of Systems:  Review of Systems  Constitutional: Negative for appetite change and fatigue.  HENT: Negative for congestion.   Respiratory: Negative for cough, chest tightness and shortness of breath.   Cardiovascular: Negative for chest pain, palpitations and leg swelling.  Gastrointestinal: Negative for abdominal pain, constipation, diarrhea and nausea.  Musculoskeletal: Negative for arthralgias and myalgias.  Skin: Negative for pallor.  Neurological: Negative for dizziness.  Psychiatric/Behavioral: The patient is not nervous/anxious.     Physical Exam: Vitals:   09/15/19 1255  BP: 90/65  Pulse: 84  Resp: 20  Temp: 97.8 F (36.6 C)  TempSrc: Oral  SpO2: 100%  Weight: 213 lb (96.6 kg)  Height: 5\' 8"  (1.727 m)   Body mass index is 32.39 kg/m. Physical Exam Constitutional:      General: He is not in acute distress.    Appearance: He is well-developed. He is not diaphoretic.  Neck:     Musculoskeletal: Neck supple.     Thyroid: No thyromegaly.  Cardiovascular:     Rate and Rhythm: Normal rate and regular rhythm.     Pulses: Normal pulses.     Heart sounds: Normal heart sounds.  Pulmonary:     Effort: Pulmonary effort is normal. No respiratory distress.  Breath sounds: Normal breath sounds.  Abdominal:     General: Bowel sounds are  normal. There is no distension.     Palpations: Abdomen is soft.     Tenderness: There is no abdominal tenderness.     Comments: History of colectomy   Genitourinary:    Comments: History of turp Musculoskeletal:     Right lower leg: No edema.     Left lower leg: No edema.     Comments:  Is able to move all extremities History of right femur ORIF       Lymphadenopathy:     Cervical: No cervical adenopathy.  Skin:    General: Skin is warm and dry.     Comments: Right upper extremity a/v fistula: + thrill + bruit        Neurological:     Mental Status: He is alert. Mental status is at baseline.  Psychiatric:        Mood and Affect: Mood normal.      SIGNIFICANT DIAGNOSTIC EXAMS   LABS REVIEWED PREVIOUS;  12-06-18: tsh 2.752 01-25-19: wbc 5.6; hgb 12.9 hct 40.8; mcv 110.3; plt 146; glucose 334; bun 25; creat 6.78; k+ 4.7; na++ 137; ca 9.0 liver normal albumin 4.0  Hgb a1c 6.9; chol 94 trig 439 LDL direct 16.3 05-10-19 hgb a1c 7.6   06-03-19: wbc 6.3; hgb 10.8; hct 33.4; mcv 106.7; plt 132; glucose 160; bun 30; creat 8.46; k+ 4.6; na++ 137; ca 9.1; liver normal albumin 3.6; chol 115; LDL 25.5; trig 531; hdl 24; PSA 2.73  NO NEW LABS.    Assessment/Plan  TODAY  1. Dependence on renal dialysis 2. End stage renal disease on dialysis due to type 2 diabetes mellitus.  3. Depression major single episode moderate 4. Medicare annual wellness subsequent encounter  Will continue current medications Will continue current plan of care Will continue to monitor his status     Ok Edwards NP [facility]  Contact [phone] Monday through Friday 8am- 5pm  After hours call [phone]

## 2019-09-16 ENCOUNTER — Emergency Department (HOSPITAL_COMMUNITY)
Admission: EM | Admit: 2019-09-16 | Discharge: 2019-09-16 | Disposition: A | Discharge status: Home / Self Care | Payer: Medicare Other | Attending: Emergency Medicine | Admitting: Emergency Medicine

## 2019-09-16 ENCOUNTER — Encounter (HOSPITAL_COMMUNITY): Payer: Self-pay

## 2019-09-16 ENCOUNTER — Emergency Department (HOSPITAL_COMMUNITY): Payer: Medicare Other

## 2019-09-16 ENCOUNTER — Inpatient Hospital Stay
Admission: RE | Admit: 2019-09-16 | Discharge: 2020-09-10 | Disposition: A | Discharge status: Nursing Home (Includes ICF) | Payer: Medicare Other | Source: Ambulatory Visit | Attending: Internal Medicine | Admitting: Internal Medicine

## 2019-09-16 DIAGNOSIS — I951 Orthostatic hypotension: Secondary | ICD-10-CM

## 2019-09-16 DIAGNOSIS — Z794 Long term (current) use of insulin: Secondary | ICD-10-CM | POA: Diagnosis not present

## 2019-09-16 DIAGNOSIS — Z992 Dependence on renal dialysis: Secondary | ICD-10-CM | POA: Insufficient documentation

## 2019-09-16 DIAGNOSIS — E860 Dehydration: Secondary | ICD-10-CM | POA: Diagnosis not present

## 2019-09-16 DIAGNOSIS — E1122 Type 2 diabetes mellitus with diabetic chronic kidney disease: Secondary | ICD-10-CM | POA: Insufficient documentation

## 2019-09-16 DIAGNOSIS — R1110 Vomiting, unspecified: Secondary | ICD-10-CM | POA: Diagnosis not present

## 2019-09-16 DIAGNOSIS — N186 End stage renal disease: Secondary | ICD-10-CM | POA: Diagnosis not present

## 2019-09-16 DIAGNOSIS — E039 Hypothyroidism, unspecified: Secondary | ICD-10-CM | POA: Diagnosis not present

## 2019-09-16 DIAGNOSIS — Z79899 Other long term (current) drug therapy: Secondary | ICD-10-CM | POA: Insufficient documentation

## 2019-09-16 DIAGNOSIS — R112 Nausea with vomiting, unspecified: Secondary | ICD-10-CM | POA: Diagnosis not present

## 2019-09-16 DIAGNOSIS — I120 Hypertensive chronic kidney disease with stage 5 chronic kidney disease or end stage renal disease: Secondary | ICD-10-CM | POA: Diagnosis not present

## 2019-09-16 LAB — COMPREHENSIVE METABOLIC PANEL
ALT: 32 U/L (ref 0–44)
AST: 20 U/L (ref 15–41)
Albumin: 4.6 g/dL (ref 3.5–5.0)
Alkaline Phosphatase: 107 U/L (ref 38–126)
Anion gap: 15 (ref 5–15)
BUN: 40 mg/dL — ABNORMAL HIGH (ref 8–23)
CO2: 25 mmol/L (ref 22–32)
Calcium: 10.4 mg/dL — ABNORMAL HIGH (ref 8.9–10.3)
Chloride: 95 mmol/L — ABNORMAL LOW (ref 98–111)
Creatinine, Ser: 10.47 mg/dL — ABNORMAL HIGH (ref 0.61–1.24)
GFR calc Af Amer: 5 mL/min — ABNORMAL LOW (ref >60)
GFR calc non Af Amer: 4 mL/min — ABNORMAL LOW (ref >60)
Glucose, Bld: 165 mg/dL — ABNORMAL HIGH (ref 70–99)
Potassium: 3.7 mmol/L (ref 3.5–5.1)
Sodium: 135 mmol/L (ref 135–145)
Total Bilirubin: 1 mg/dL (ref 0.3–1.2)
Total Protein: 9.3 g/dL — ABNORMAL HIGH (ref 6.5–8.1)

## 2019-09-16 LAB — CBC WITH DIFFERENTIAL/PLATELET
Abs Immature Granulocytes: 0.05 10*3/uL (ref 0.00–0.07)
Basophils Absolute: 0 10*3/uL (ref 0.0–0.1)
Basophils Relative: 0 %
Eosinophils Absolute: 0.1 10*3/uL (ref 0.0–0.5)
Eosinophils Relative: 1 %
HCT: 50.6 % (ref 39.0–52.0)
Hemoglobin: 16.4 g/dL (ref 13.0–17.0)
Immature Granulocytes: 1 %
Lymphocytes Relative: 19 %
Lymphs Abs: 1.5 10*3/uL (ref 0.7–4.0)
MCH: 34.7 pg — ABNORMAL HIGH (ref 26.0–34.0)
MCHC: 32.4 g/dL (ref 30.0–36.0)
MCV: 107.2 fL — ABNORMAL HIGH (ref 80.0–100.0)
Monocytes Absolute: 0.9 10*3/uL (ref 0.1–1.0)
Monocytes Relative: 11 %
Neutro Abs: 5.5 10*3/uL (ref 1.7–7.7)
Neutrophils Relative %: 68 %
Platelets: 141 10*3/uL — ABNORMAL LOW (ref 150–400)
RBC: 4.72 MIL/uL (ref 4.22–5.81)
RDW: 14 % (ref 11.5–15.5)
WBC: 8 10*3/uL (ref 4.0–10.5)
nRBC: 0 % (ref 0.0–0.2)

## 2019-09-16 LAB — POC OCCULT BLOOD, ED: Fecal Occult Bld: NEGATIVE

## 2019-09-16 LAB — LIPASE, BLOOD: Lipase: 37 U/L (ref 11–51)

## 2019-09-16 MED ORDER — ONDANSETRON 8 MG PO TBDP
8.0000 mg | ORAL_TABLET | Freq: Once | ORAL | Status: AC
  Administered 2019-09-16: 8 mg via ORAL
  Filled 2019-09-16: qty 1

## 2019-09-16 NOTE — ED Triage Notes (Signed)
Pt from the [facility], dialysis on Wednesday, started vomiting yesterday. Pt denies pain or other complaints.  BP running low.

## 2019-09-16 NOTE — ED Provider Notes (Signed)
[facility] EMERGENCY DEPARTMENT Provider Note   CSN: [id number] Arrival date & time: 09/16/19  5465     History   Chief Complaint Chief Complaint  Patient presents with  . Emesis   Level 5 caveat due to cognitive delay HPI Zachary [name] is a 73 y.o. male.     The history is provided by the patient. The history is limited by the condition of the patient.  Emesis Severity:  Moderate Progression:  Unchanged Chronicity:  New Relieved by:  None tried Worsened by:  Nothing Associated symptoms: no abdominal pain   Patient with history of cognitive delay, anemia, ESRD on dialysis presents with vomiting.  Patient stays at a local nursing facility.  He apparently started vomiting yesterday and had low blood pressure.  Patient tells me he has had blood in his vomit.  He reports feeling dizzy.  He has no pain complaints.  He is due for dialysis later today.  Past Medical History:  Diagnosis Date  . Abnormal CT scan, kidney 10/06/2011  . Acute pyelonephritis 10/07/2011  . Anemia    normocytic  . Anxiety    mental retardation  . Bladder wall thickening 10/06/2011  . BPH (benign prostatic hypertrophy)   . Diabetes mellitus   . Dialysis patient [facility])    Tuesday, Thursday and Saturday,   . DVT of leg (deep venous thrombosis) ([place]) 12/25/2016  . Edema     history of lower extremity edema  . GERD (gastroesophageal reflux disease)   . Heme positive stool   . Hydronephrosis   . Hyperkalemia   . Hyperlipidemia   . Hypernatremia   . Hypertension   . Hypothyroidism   . Impaired speech   . 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 MR (mental retardation)   . Muscle weakness   . Obstructive uropathy   . 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 Poor historian poor historian  . Protein calorie malnutrition ([place])   . Pyelonephritis   . Renal failure (ARF), acute on chronic (HCC)   . Renal insufficiency    chronic history  . Sepsis ([place])   . Smoking   . Uremia   . Urinary retention    . UTI (lower urinary tract infection) 10/06/2011    Patient Active Problem List   Diagnosis Date Noted  . Depression, major, single episode, moderate ([place]) 08/09/2019  . Type 2 diabetes mellitus with hypertension and end stage renal disease on dialysis ([place]) 07/31/2019  . Orthostatic hypotension 02/17/2019  . GERD without esophagitis 02/17/2019  . Dyslipidemia associated with type 2 diabetes mellitus ([place]) 02/17/2019  . Bilateral lower extremity edema 02/17/2019  . End stage renal disease on dialysis due to type 2 diabetes mellitus ([place]) 02/13/2019  . Hypertriglyceridemia 01/25/2019  . Gastroenteritis   . Hypothyroidism 01/19/2018  . Enteritis 01/19/2018  . S/P colectomy 08/31/2017  . Malignant neoplasm of colon ([place]) 06/25/2017  . Colitis 05/17/2017  . Dysplastic polyp of colon   . ESRD (end stage renal disease) ([place]) 05/01/2017  . Colonoscopy causing post-procedural bleeding   . Lower GI bleed   . Anemia in chronic kidney disease 08/23/2016  . Dependence on renal dialysis ([place]) 08/23/2016  . Type 2 diabetes mellitus with unspecified diabetic retinopathy without macular edema ([place]) 08/23/2016  . Hydronephrosis, bilateral 03/10/2016  . Essential (primary) hypertension 07/08/2015  . Hyperlipidemia 07/08/2015  . Obstructive and reflux uropathy, unspecified 04/17/2015  . Bladder neck contracture   . Benign prostatic hyperplasia with urinary retention 03/03/2015  .  Intellectual disability 10/10/2011  . Acute pyelonephritis 10/07/2011  . Iron deficiency anemia 10/02/2011    Past Surgical History:  Procedure Laterality Date  . A/V FISTULAGRAM N/A 08/13/2018   Procedure: A/V FISTULAGRAM - Right Upper;  Surgeon: Elam Dutch, MD;  Location: [place] CV LAB;  Service: Cardiovascular;  Laterality: N/A;  . A/V FISTULAGRAM N/A 11/22/2018   Procedure: A/V FISTULAGRAM - Right Upper;  Surgeon: Waynetta Sandy, MD;  Location: [place] CV LAB;  Service: Cardiovascular;   Laterality: N/A;  . AV FISTULA PLACEMENT Left 07/06/2015   Procedure:  INSERTION LEFT ARM ARTERIOVENOUS GORTEX GRAFT;  Surgeon: Angelia Mould, MD;  Location: [place];  Service: Vascular;  Laterality: Left;  . AV FISTULA PLACEMENT Right 02/26/2016   Procedure: ARTERIOVENOUS (AV) FISTULA CREATION ;  Surgeon: Angelia Mould, MD;  Location: [place];  Service: Vascular;  Laterality: Right;  . AV FISTULA PLACEMENT Right 11/25/2018   Procedure: INSERTION OF ARTERIOVENOUS (AV) ARTEGRAFT RIGHT UPPER ARM;  Surgeon: Waynetta Sandy, MD;  Location: [place];  Service: Vascular;  Laterality: Right;  . [place] REMOVAL Left 10/09/2015   Procedure: REMOVAL OF ARTERIOVENOUS GORETEX GRAFT ([place]) Evacuation of Lymphocele, Vein Patch angioplasty of brachial artery.;  Surgeon: Angelia Mould, MD;  Location: [place];  Service: Vascular;  Laterality: Left;  . BASCILIC VEIN TRANSPOSITION Right 02/26/2016   Procedure: Right BASCILIC VEIN TRANSPOSITION;  Surgeon: Angelia Mould, MD;  Location: [place];  Service: Vascular;  Laterality: Right;  . CIRCUMCISION N/A 01/04/2014   Procedure: CIRCUMCISION ADULT (procedure #1);  Surgeon: Marissa Nestle, MD;  Location[facility];  Service: Urology;  Laterality: N/A;  . COLECTOMY N/A 05/04/2017   Procedure: TOTAL COLECTOMY;  Surgeon: Aviva Signs, MD;  Location: [facility];  Service: General;  Laterality: N/A;  . COLONOSCOPY N/A 04/27/2017   Procedure: COLONOSCOPY;  Surgeon: Daneil Dolin, MD;  [facility];  Service: Endoscopy;  Laterality: N/A;  245  . CYSTOSCOPY W/ RETROGRADES Bilateral 06/29/2015   Procedure: CYSTOSCOPY, DILATION OF URETHRAL STRICTURE WITH BILATERAL RETROGRADE PYELOGRAM,SUPRAPUBIC TUBE CHANGE;  Surgeon: Festus Aloe, MD;  Location: [facility];  Service: Urology;  Laterality: Bilateral;  . CYSTOSCOPY WITH URETHRAL DILATATION N/A 12/29/2013   Procedure: CYSTOSCOPY WITH URETHRAL DILATATION;  Surgeon: Marissa Nestle, MD;  [facility];   Service: Urology;  Laterality: N/A;  . ESOPHAGOGASTRODUODENOSCOPY N/A 04/27/2017   Procedure: ESOPHAGOGASTRODUODENOSCOPY (EGD);  Surgeon: Daneil Dolin, MD;  [facility];  Service: Endoscopy;  Laterality: N/A;  . INSERTION OF DIALYSIS CATHETER Right 11/25/2018   Procedure: INSERTION OF DIALYSIS CATHETER RIGHT INTERNAL JUGULAR;  Surgeon: Waynetta Sandy, MD;  Location: [place];  Service: Vascular;  Laterality: Right;  . IR AV DIALY SHUNT INTRO [place] W/PTA/IMG RIGHT Right 09/07/2018  . IR REMOVAL TUN CV CATH W/O FL  01/12/2019  . IR THROMBECTOMY AV FISTULA W/THROMBOLYSIS/PTA INC/SHUNT/IMG RIGHT Right 04/26/2018  . IR US GUIDE VASC ACCESS RIGHT  04/26/2018  . IR US GUIDE VASC ACCESS RIGHT  09/07/2018  . ORIF FEMUR FRACTURE Right 11/22/2016   Procedure: OPEN REDUCTION INTERNAL FIXATION (ORIF) DISTAL FEMUR FRACTURE;  Surgeon: Rod Can, MD;  Location: [place];  Service: Orthopedics;  Laterality: Right;  . PATCH ANGIOPLASTY Right 12/10/2017   Procedure: PATCH ANGIOPLASTY;  Surgeon: Angelia Mould, MD;  Location: [facility];  Service: Vascular;  Laterality: Right;  . PERIPHERAL VASCULAR BALLOON ANGIOPLASTY  08/13/2018   Procedure: PERIPHERAL VASCULAR BALLOON ANGIOPLASTY;  Surgeon: Oneida Alar,  Jessy Oto, MD;  Location: [place] CV LAB;  Service: Cardiovascular;;  right AV fistula   . PERIPHERAL VASCULAR BALLOON ANGIOPLASTY  11/22/2018   Procedure: PERIPHERAL VASCULAR BALLOON ANGIOPLASTY;  Surgeon: Waynetta Sandy, MD;  Location: [place] CV LAB;  Service: Cardiovascular;;  rt AV fistula  . PERIPHERAL VASCULAR CATHETERIZATION N/A 10/08/2015   Procedure: A/V Shuntogram;  Surgeon: Angelia Mould, MD;  Location: [place] CV [facility];  Service: Cardiovascular;  Laterality: N/A;  . THROMBECTOMY W/ EMBOLECTOMY Right 12/10/2017   Procedure: THROMBECTOMY REVISION RIGHT ARM  ARTERIOVENOUS FISTULA;  Surgeon: Angelia Mould, MD;  Location: [place];   Service: Vascular;  Laterality: Right;  . TRANSURETHRAL RESECTION OF PROSTATE N/A 01/04/2014   Procedure: TRANSURETHRAL RESECTION OF THE PROSTATE (TURP) (procedure #2);  Surgeon: Marissa Nestle, MD;  [facility];  Service: Urology;  Laterality: N/A;        Home Medications    Prior to Admission medications   Medication Sig Start Date End Date Taking? Authorizing Provider  atorvastatin (LIPITOR) 80 MG tablet Take 80 mg by mouth every evening.    [provider]  insulin aspart (NOVOLOG FLEXPEN) 100 UNIT/ML FlexPen Inject 13 Units into the skin 3 (three) times daily with meals.  01/25/19   [provider]  Insulin Glargine (BASAGLAR KWIKPEN) 100 UNIT/ML SOPN Inject 30 Units into the skin at bedtime.  06/02/19   [provider]  JANUVIA 25 MG tablet Take 25 mg by mouth daily.  11/01/18   [provider]  levothyroxine (SYNTHROID, LEVOTHROID) 88 MCG tablet Take 88 mcg by mouth daily before breakfast.    [provider]  midodrine (PROAMATINE) 10 MG tablet Take 10 mg by mouth. Send med with resident on Mon/Wed/Fri to dialysis. [facility] not give [facility] give to resident to take with him. send with 5mg  to equal 15mg . 06/01/19   [provider]  midodrine (PROAMATINE) 5 MG tablet Take 5 mg by mouth. Send with resident on dialysis days.  Mon, Wed, Fri.  Take along with 10 mg to equal 15 mg 06/01/19   [provider]  Multiple Vitamin (MULTIVITAMIN WITH MINERALS) TABS tablet Take 1 tablet by mouth every evening.     [provider]  NON FORMULARY Fluid Restriction - 1200 cc a day 500 cc for days, Evenings 500 cc, Night 200 cc    [provider]  NON FORMULARY Diet Type:  NAS, Cons CHO Diet    [provider]  Nutritional Supplements (NEPRO/CARBSTEADY PO) Take 1 Can by mouth 2 (two) times daily.    [provider]  Omega-3 Fatty Acids (FISH OIL PO) Take 2 capsules by mouth at bedtime.  03/24/19   [provider]  omeprazole (PRILOSEC) 40 MG capsule Take 40 mg by mouth at bedtime.     [provider]  polyethylene glycol (MIRALAX / GLYCOLAX) packet Take 17 g by mouth as needed for mild constipation.     [provider]  sertraline (ZOLOFT) 50 MG tablet Take 50 mg by mouth daily.    [provider]  sevelamer carbonate (RENVELA) 800 MG tablet Take 1,600 mg by mouth 3 (three) times daily.     [provider]  sevelamer carbonate (RENVELA) 800 MG tablet Take 800 mg by mouth daily. With snacks    [provider]  tamsulosin (FLOMAX) 0.4 MG CAPS capsule Take 0.4 mg by mouth every evening. Give 30 minutes after a meal  [provider]  torsemide (DEMADEX) 10 MG tablet Take 5 tablets (50 mg total) by mouth daily. 04/07/16   [name], MD    Family History Family History  Problem Relation Age of Onset  . Cancer Mother   . Colon cancer Neg Hx     Social History Social History   Tobacco Use  . Smoking status: Never Smoker  . Smokeless tobacco: Never Used  Substance Use Topics  . Alcohol use: No  . Drug use: No     Allergies   Patient has no known allergies.   Review of Systems Review of Systems  Unable to perform ROS: Other  Gastrointestinal: Positive for vomiting. Negative for abdominal pain.  Cognitive delay   Physical Exam Updated Vital Signs BP (!) 86/83 (BP Location: Left Arm)   Pulse (!) 108   Temp 98.3 F (36.8 C) (Oral)   Resp 18   Wt 96 kg   SpO2 97%   BMI 32.18 kg/m   Physical Exam CONSTITUTIONAL:chronically ill-appearing HEAD: Normocephalic/atraumatic EYES: EOMI/PERRL ENMT: Mucous membranes dry NECK: supple no meningeal signs SPINE/BACK:entire spine nontender CV: S1/S2 noted, no murmurs/rubs/gallops noted LUNGS: Lungs are clear to auscultation bilaterally, no apparent distress ABDOMEN: soft, nontender, no rebound or guarding, bowel sounds noted throughout abdomen Rectal-no blood or melena.   No mass or abscess.  Chaperone present for exam GU:no cva tenderness NEURO: Pt is awake/alert, moves all extremitiesx4. EXTREMITIES: pulses normal/equal, full ROM SKIN: warm, color normal PSYCH: no abnormalities of mood noted, alert and oriented to situation   ED Treatments / Results  Labs (all labs ordered are listed, but only abnormal results are displayed) Labs Reviewed  CBC WITH DIFFERENTIAL/PLATELET - Abnormal; Notable for the following components:      Result Value   MCV 107.2 (*)    MCH 34.7 (*)    Platelets 141 (*)    All other components within normal limits  COMPREHENSIVE METABOLIC PANEL - Abnormal; Notable for the following components:   Chloride 95 (*)    Glucose, Bld 165 (*)    BUN 40 (*)    Creatinine, Ser 10.47 (*)    Calcium 10.4 (*)    Total Protein 9.3 (*)    GFR calc non Af Amer 4 (*)    GFR calc Af Amer 5 (*)    All other components within normal limits  LIPASE, BLOOD  POC OCCULT BLOOD, ED    EKG EKG Interpretation  Date/Time:  Friday September 16 2019 05:41:41 EST Ventricular Rate:  109 PR Interval:    QRS Duration: 89 QT Interval:  339 QTC Calculation: 457 R Axis:   67 Text Interpretation: Sinus tachycardia Consider anterior infarct No significant change since last tracing Confirmed by Ripley Fraise ([id number]) on 09/16/2019 5:45:33 AM   Radiology Dg Chest Port 1 View  Result Date: 09/16/2019 CLINICAL DATA:  Vomiting.  Dialysis EXAM: PORTABLE CHEST 1 VIEW COMPARISON:  11/25/2018 FINDINGS: Normal heart size. Mild convexity upper right mediastinum that is stable and likely vascular. Normal aortic contours. There is no edema, consolidation, effusion, or pneumothorax. IMPRESSION: No active disease. Electronically Signed   By: Monte Fantasia M.D.   On: 09/16/2019 06:10    Procedures Procedures   Medications Ordered in ED Medications  ondansetron (ZOFRAN-ODT) disintegrating tablet 8 mg (has no administration in time range)     Initial  Impression / Assessment and Plan / ED Course  I have reviewed the triage vital signs and the nursing notes.  Pertinent labs &  imaging results that were available during my care of the patient were reviewed by me and considered in my medical decision making (see chart for details).        5:42 AM Patient presents for nurse facility for vomiting.  Also reports has had low blood pressure, but have found readings in the chart from previous times that were in the 90s. He reports vomiting blood, but his rectal exam was negative for blood. He is a poor historian It is reported he had a negative Covid test over 24 hours ago Imaging and labs are pending this time 7:21 AM Patient overall well-appearing.  Labs reassuring.  He is now taking p.o. fluids.  When I review his chart, he will have episodes of blood pressures in the upper 80s and 90s.  This is not new for patient.  He has known history of orthostatic hypotension and requires midodrine during dialysis He denies any abdominal pain at this time.  Will discharge back to nursing facility Final Clinical Impressions(s) / ED Diagnoses   Final diagnoses:  Dehydration  Non-intractable vomiting with nausea, unspecified vomiting type  Orthostatic hypotension    ED Discharge Orders    None       Ripley Fraise, MD 09/16/19 [phone]

## 2019-09-16 NOTE — ED Notes (Signed)
RN attempted IV access X2 without success. Pt has Upper Right Extremity Restriction.

## 2019-09-17 DIAGNOSIS — R1110 Vomiting, unspecified: Secondary | ICD-10-CM | POA: Diagnosis not present

## 2019-09-17 DIAGNOSIS — N186 End stage renal disease: Secondary | ICD-10-CM | POA: Diagnosis not present

## 2019-09-17 DIAGNOSIS — Z992 Dependence on renal dialysis: Secondary | ICD-10-CM | POA: Diagnosis not present

## 2019-09-17 DIAGNOSIS — R110 Nausea: Secondary | ICD-10-CM | POA: Diagnosis not present

## 2019-09-17 DIAGNOSIS — D509 Iron deficiency anemia, unspecified: Secondary | ICD-10-CM | POA: Diagnosis not present

## 2019-09-17 DIAGNOSIS — N2581 Secondary hyperparathyroidism of renal origin: Secondary | ICD-10-CM | POA: Diagnosis not present

## 2019-09-19 ENCOUNTER — Encounter: Payer: Self-pay | Admitting: Adult Health

## 2019-09-19 DIAGNOSIS — N2581 Secondary hyperparathyroidism of renal origin: Secondary | ICD-10-CM | POA: Diagnosis not present

## 2019-09-19 DIAGNOSIS — N186 End stage renal disease: Secondary | ICD-10-CM | POA: Diagnosis not present

## 2019-09-19 DIAGNOSIS — D509 Iron deficiency anemia, unspecified: Secondary | ICD-10-CM | POA: Diagnosis not present

## 2019-09-19 DIAGNOSIS — R1110 Vomiting, unspecified: Secondary | ICD-10-CM | POA: Diagnosis not present

## 2019-09-19 DIAGNOSIS — R110 Nausea: Secondary | ICD-10-CM | POA: Diagnosis not present

## 2019-09-19 DIAGNOSIS — Z992 Dependence on renal dialysis: Secondary | ICD-10-CM | POA: Diagnosis not present

## 2019-09-19 NOTE — Progress Notes (Signed)
Location:    [place] Room Number: 112/D Place of Service:  SNF (31)   CODE STATUS: Full Code  No Known Allergies  Chief Complaint  Patient presents with  . Acute Visit    Status    HPI:    Past Medical History:  Diagnosis Date  . Abnormal CT scan, kidney 10/06/2011  . Acute pyelonephritis 10/07/2011  . Anemia    normocytic  . Anxiety    mental retardation  . Bladder wall thickening 10/06/2011  . BPH (benign prostatic hypertrophy)   . Diabetes mellitus   . Dialysis patient [facility])    Tuesday, Thursday and Saturday,   . DVT of leg (deep venous thrombosis) ([place]) 12/25/2016  . Edema     history of lower extremity edema  . GERD (gastroesophageal reflux disease)   . Heme positive stool   . Hydronephrosis   . Hyperkalemia   . Hyperlipidemia   . Hypernatremia   . Hypertension   . Hypothyroidism   . Impaired speech   . 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[place])   . MR (mental retardation)   . Muscle weakness   . Obstructive uropathy   . 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 Poor historian poor historian  . Protein calorie malnutrition ([place])   . Pyelonephritis   . Renal failure (ARF), acute on chronic (HCC)   . Renal insufficiency    chronic history  . Sepsis ([place])   . Smoking   . Uremia   . Urinary retention   . UTI (lower urinary tract infection) 10/06/2011    Past Surgical History:  Procedure Laterality Date  . A/V FISTULAGRAM N/A 08/13/2018   Procedure: A/V FISTULAGRAM - Right Upper;  Surgeon: Elam Dutch, MD;  Location: [place] CV LAB;  Service: Cardiovascular;  Laterality: N/A;  . A/V FISTULAGRAM N/A 11/22/2018   Procedure: A/V FISTULAGRAM - Right Upper;  Surgeon: Waynetta Sandy, MD;  Location: [place] CV LAB;  Service: Cardiovascular;  Laterality: N/A;  . AV FISTULA PLACEMENT Left 07/06/2015   Procedure:  INSERTION LEFT ARM ARTERIOVENOUS GORTEX GRAFT;  Surgeon: Angelia Mould, MD;  Location: [place];   Service: Vascular;  Laterality: Left;  . AV FISTULA PLACEMENT Right 02/26/2016   Procedure: ARTERIOVENOUS (AV) FISTULA CREATION ;  Surgeon: Angelia Mould, MD;  Location: [place];  Service: Vascular;  Laterality: Right;  . AV FISTULA PLACEMENT Right 11/25/2018   Procedure: INSERTION OF ARTERIOVENOUS (AV) ARTEGRAFT RIGHT UPPER ARM;  Surgeon: Waynetta Sandy, MD;  Location: [place];  Service: Vascular;  Laterality: Right;  . [place] REMOVAL Left 10/09/2015   Procedure: REMOVAL OF ARTERIOVENOUS GORETEX GRAFT ([place]) Evacuation of Lymphocele, Vein Patch angioplasty of brachial artery.;  Surgeon: Angelia Mould, MD;  Location: [place];  Service: Vascular;  Laterality: Left;  . BASCILIC VEIN TRANSPOSITION Right 02/26/2016   Procedure: Right BASCILIC VEIN TRANSPOSITION;  Surgeon: Angelia Mould, MD;  Location: [place];  Service: Vascular;  Laterality: Right;  . CIRCUMCISION N/A 01/04/2014   Procedure: CIRCUMCISION ADULT (procedure #1);  Surgeon: Marissa Nestle, MD;  Location: [facility] ORS;  Service: Urology;  Laterality: N/A;  . COLECTOMY N/A 05/04/2017   Procedure: TOTAL COLECTOMY;  Surgeon: Aviva Signs, MD;  Location: [facility];  Service: General;  Laterality: N/A;  . COLONOSCOPY N/A 04/27/2017   Procedure: COLONOSCOPY;  Surgeon: Daneil Dolin, MD;  Location: [facility]O [facility];  Service: Endoscopy;  Laterality: N/A;  245  . CYSTOSCOPY W/ RETROGRADES Bilateral 06/29/2015  Procedure: CYSTOSCOPY, DILATION OF URETHRAL STRICTURE WITH BILATERAL RETROGRADE PYELOGRAM,SUPRAPUBIC TUBE CHANGE;  Surgeon: Festus Aloe, MD;  Location: [facility];  Service: Urology;  Laterality: Bilateral;  . CYSTOSCOPY WITH URETHRAL DILATATION N/A 12/29/2013   Procedure: CYSTOSCOPY WITH URETHRAL DILATATION;  Surgeon: Marissa Nestle, MD;  Location: [facility];  Service: Urology;  Laterality: N/A;  . ESOPHAGOGASTRODUODENOSCOPY N/A 04/27/2017   Procedure: ESOPHAGOGASTRODUODENOSCOPY (EGD);  Surgeon: Daneil Dolin, MD;  Location: [facility];  Service: Endoscopy;  Laterality: N/A;  . INSERTION OF DIALYSIS CATHETER Right 11/25/2018   Procedure: INSERTION OF DIALYSIS CATHETER RIGHT INTERNAL JUGULAR;  Surgeon: Waynetta Sandy, MD;  Location: [place];  Service: Vascular;  Laterality: Right;  . IR AV DIALY SHUNT INTRO [place] W/PTA/IMG RIGHT Right 09/07/2018  . IR REMOVAL TUN CV CATH W/O FL  01/12/2019  . IR THROMBECTOMY AV FISTULA W/THROMBOLYSIS/PTA INC/SHUNT/IMG RIGHT Right 04/26/2018  . IR US GUIDE VASC ACCESS RIGHT  04/26/2018  . IR US GUIDE VASC ACCESS RIGHT  09/07/2018  . ORIF FEMUR FRACTURE Right 11/22/2016   Procedure: OPEN REDUCTION INTERNAL FIXATION (ORIF) DISTAL FEMUR FRACTURE;  Surgeon: Rod Can, MD;  Location: [place];  Service: Orthopedics;  Laterality: Right;  . PATCH ANGIOPLASTY Right 12/10/2017   Procedure: PATCH ANGIOPLASTY;  Surgeon: Angelia Mould, MD;  Location: [facility] OR;  Service: Vascular;  Laterality: Right;  . PERIPHERAL VASCULAR BALLOON ANGIOPLASTY  08/13/2018   Procedure: PERIPHERAL VASCULAR BALLOON ANGIOPLASTY;  Surgeon: Elam Dutch, MD;  Location: [place] CV LAB;  Service: Cardiovascular;;  right AV fistula   . PERIPHERAL VASCULAR BALLOON ANGIOPLASTY  11/22/2018   Procedure: PERIPHERAL VASCULAR BALLOON ANGIOPLASTY;  Surgeon: Waynetta Sandy, MD;  Location: [place] CV LAB;  Service: Cardiovascular;;  rt AV fistula  . PERIPHERAL VASCULAR CATHETERIZATION N/A 10/08/2015   Procedure: A/V Shuntogram;  Surgeon: Angelia Mould, MD;  Location: [place] CV LAB;  Service: Cardiovascular;  Laterality: N/A;  . THROMBECTOMY W/ EMBOLECTOMY Right 12/10/2017   Procedure: THROMBECTOMY REVISION RIGHT ARM  ARTERIOVENOUS FISTULA;  Surgeon: Angelia Mould, MD;  Location: [place];  Service: Vascular;  Laterality: Right;  . TRANSURETHRAL RESECTION OF PROSTATE N/A 01/04/2014   Procedure: TRANSURETHRAL RESECTION OF THE PROSTATE (TURP) (procedure #2);  Surgeon:  Marissa Nestle, MD;  Location: [facility];  Service: Urology;  Laterality: N/A;    Social History   Socioeconomic History  . Marital status: Single    Spouse name: Not on file  . Number of children: Not on file  . Years of education: Not on file  . Highest education level: Not on file  Occupational History  . 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 Financial resource strain: Not hard at all  . Food insecurity    Worry: Never true    Inability: Never true  . Transportation needs    Medical: No    Non-medical: No  Tobacco Use  . Smoking status: Never Smoker  . Smokeless tobacco: Never Used  Substance and Sexual Activity  . Alcohol use: No  . Drug use: No  . Sexual activity: Not Currently  Lifestyle  . Physical activity    Days per week: 0 days    Minutes per session: 0 min  . Stress: Not at all  Relationships  . Social connections    Talks on phone: More than three times a week    Gets together: Never    Attends religious service: Never    Active member of  club or organization: No    Attends meetings of clubs or organizations: Never    Relationship status: Never married  . Intimate partner violence    Fear of current or ex partner: No    Emotionally abused: No    Physically abused: No    Forced sexual activity: No  Other Topics Concern  . 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   [facility]    Family History  Problem Relation Age of Onset  . Cancer Mother   . Colon cancer Neg Hx       VITAL SIGNS BP (!) 78/58   Temp 97.9 F (36.6 C) (Oral)   Resp 16   Ht 5\' 8"  (1.727 m)   Wt 213 lb (96.6 kg)   SpO2 100%   BMI 32.39 kg/m   Outpatient Encounter Medications as of 09/19/2019  Medication Sig  . atorvastatin (LIPITOR) 80 MG tablet Take 80 mg by mouth every evening.  . insulin aspart (NOVOLOG FLEXPEN) 100 UNIT/ML FlexPen Inject 13 Units into the skin 3 (three) times  daily with meals.   . Insulin Glargine (BASAGLAR KWIKPEN) 100 UNIT/ML SOPN Inject 30 Units into the skin at bedtime.   Marland Kitchen JANUVIA 25 MG tablet Take 25 mg by mouth daily.   Marland Kitchen levothyroxine (SYNTHROID, LEVOTHROID) 88 MCG tablet Take 88 mcg by mouth daily before breakfast.  . midodrine (PROAMATINE) 10 MG tablet Take 10 mg by mouth. Send med with resident on Mon/Wed/Fri to dialysis. [facility] not give [facility] give to resident to take with him. send with 5mg  to equal 15mg .  . midodrine (PROAMATINE) 5 MG tablet Take 5 mg by mouth. Send with resident on dialysis days.  Mon, Wed, Fri.  Take along with 10 mg to equal 15 mg  . Multiple Vitamin (MULTIVITAMIN WITH MINERALS) TABS tablet Take 1 tablet by mouth every evening.   . NON FORMULARY Fluid Restriction - 1200 cc a day 500 cc for days, Evenings 500 cc, Night 200 cc  . NON FORMULARY Diet Type:  NAS, Cons CHO Diet  . Nutritional Supplements (NEPRO/CARBSTEADY PO) Take 1 Can by mouth 2 (two) times daily.  Marland Kitchen omega-3 acid ethyl esters (LOVAZA) 1 g capsule 1 gram; amt: 2 capsules; oral  Special Instructions: elevated triglycerides At Bedtime 09:00 PM  . omeprazole (PRILOSEC) 40 MG capsule Take 40 mg by mouth at bedtime.   . polyethylene glycol (MIRALAX / GLYCOLAX) packet Take 17 g by mouth as needed for mild constipation.   . sertraline (ZOLOFT) 50 MG tablet Take 50 mg by mouth daily.  . sevelamer carbonate (RENVELA) 800 MG tablet Take 1,600 mg by mouth 3 (three) times daily.   . sevelamer carbonate (RENVELA) 800 MG tablet Take 800 mg by mouth daily. With snacks  . tamsulosin (FLOMAX) 0.4 MG CAPS capsule Take 0.4 mg by mouth every evening. Give 30 minutes after a meal  . torsemide (DEMADEX) 10 MG tablet Take 5 tablets (50 mg total) by mouth daily.  . [DISCONTINUED] Omega-3 Fatty Acids (FISH OIL PO) Take 2 capsules by mouth at bedtime.    No facility-administered encounter medications on file as of 09/19/2019.      SIGNIFICANT DIAGNOSTIC EXAMS        ASSESSMENT/ PLAN:    MD is aware of resident's narcotic use and is in agreement with current plan of care. We will attempt to wean resident as appropriate.  Neoma Laming  [place] NP [facility]  Contact [phone] Monday through Friday 8am- 5pm  After hours call [phone]

## 2019-09-21 DIAGNOSIS — N186 End stage renal disease: Secondary | ICD-10-CM | POA: Diagnosis not present

## 2019-09-21 DIAGNOSIS — D509 Iron deficiency anemia, unspecified: Secondary | ICD-10-CM | POA: Diagnosis not present

## 2019-09-21 DIAGNOSIS — N2581 Secondary hyperparathyroidism of renal origin: Secondary | ICD-10-CM | POA: Diagnosis not present

## 2019-09-21 DIAGNOSIS — R110 Nausea: Secondary | ICD-10-CM | POA: Diagnosis not present

## 2019-09-21 DIAGNOSIS — R1110 Vomiting, unspecified: Secondary | ICD-10-CM | POA: Diagnosis not present

## 2019-09-21 DIAGNOSIS — Z992 Dependence on renal dialysis: Secondary | ICD-10-CM | POA: Diagnosis not present

## 2019-09-24 DIAGNOSIS — N186 End stage renal disease: Secondary | ICD-10-CM | POA: Diagnosis not present

## 2019-09-24 DIAGNOSIS — N2581 Secondary hyperparathyroidism of renal origin: Secondary | ICD-10-CM | POA: Diagnosis not present

## 2019-09-24 DIAGNOSIS — D509 Iron deficiency anemia, unspecified: Secondary | ICD-10-CM | POA: Diagnosis not present

## 2019-09-24 DIAGNOSIS — Z992 Dependence on renal dialysis: Secondary | ICD-10-CM | POA: Diagnosis not present

## 2019-09-24 DIAGNOSIS — R1110 Vomiting, unspecified: Secondary | ICD-10-CM | POA: Diagnosis not present

## 2019-09-24 DIAGNOSIS — R110 Nausea: Secondary | ICD-10-CM | POA: Diagnosis not present

## 2019-09-26 DIAGNOSIS — R110 Nausea: Secondary | ICD-10-CM | POA: Diagnosis not present

## 2019-09-26 DIAGNOSIS — Z0000 Encounter for general adult medical examination without abnormal findings: Secondary | ICD-10-CM | POA: Insufficient documentation

## 2019-09-26 DIAGNOSIS — R1110 Vomiting, unspecified: Secondary | ICD-10-CM | POA: Diagnosis not present

## 2019-09-26 DIAGNOSIS — N186 End stage renal disease: Secondary | ICD-10-CM | POA: Diagnosis not present

## 2019-09-26 DIAGNOSIS — D509 Iron deficiency anemia, unspecified: Secondary | ICD-10-CM | POA: Diagnosis not present

## 2019-09-26 DIAGNOSIS — N2581 Secondary hyperparathyroidism of renal origin: Secondary | ICD-10-CM | POA: Diagnosis not present

## 2019-09-26 DIAGNOSIS — Z992 Dependence on renal dialysis: Secondary | ICD-10-CM | POA: Diagnosis not present

## 2019-09-28 ENCOUNTER — Non-Acute Institutional Stay (SKILLED_NURSING_FACILITY): Payer: Medicare Other | Admitting: Internal Medicine

## 2019-09-28 DIAGNOSIS — E1122 Type 2 diabetes mellitus with diabetic chronic kidney disease: Secondary | ICD-10-CM

## 2019-09-28 DIAGNOSIS — E034 Atrophy of thyroid (acquired): Secondary | ICD-10-CM | POA: Diagnosis not present

## 2019-09-28 DIAGNOSIS — Z992 Dependence on renal dialysis: Secondary | ICD-10-CM

## 2019-09-28 DIAGNOSIS — Z794 Long term (current) use of insulin: Secondary | ICD-10-CM

## 2019-09-28 DIAGNOSIS — N186 End stage renal disease: Secondary | ICD-10-CM | POA: Diagnosis not present

## 2019-09-28 DIAGNOSIS — E11319 Type 2 diabetes mellitus with unspecified diabetic retinopathy without macular edema: Secondary | ICD-10-CM

## 2019-09-28 NOTE — Progress Notes (Signed)
This encounter was created in error - please disregard.

## 2019-09-30 ENCOUNTER — Encounter: Payer: Self-pay | Admitting: Internal Medicine

## 2019-09-30 NOTE — Progress Notes (Signed)
Location:  [place] Room Number: 112-D Place of Service:  SNF (31)  Hennie Duos, MD  Patient Care Team: Hennie Duos, MD as PCP - General (Internal Medicine) Cassandria Anger, MD as Consulting Physician (Endocrinology) Fran Lowes, MD as Consulting Physician (Nephrology) Gala Romney Cristopher Estimable, MD as Consulting Physician (Gastroenterology) [facility] [place] [facility]) Nyoka Cowden Phylis Bougie, NP as Nurse Practitioner (Geriatric Medicine)  Extended Emergency Contact Information Primary Emergency Contact: Betsey Amen Address: DSS  [place] of [place] Phone: [phone] Work Phone: [phone] Relation: Legal Guardian Secondary Emergency Contact: Rockwood,Richard Address: [address]          [place], [place] [address] [place] Phone: [phone] Relation: Brother    Allergies: Patient has no known allergies.  Chief Complaint  Patient presents with  . Medical Management of Chronic Issues    Routine [place] visit    HPI: Patient is a 73 y.o. male who is being seen for routine issues of end-stage renal disease on dialysis, hypothyroidism, and diabetes mellitus type 2.  Past Medical History:  Diagnosis Date  . Abnormal CT scan, kidney 10/06/2011  . Acute pyelonephritis 10/07/2011  . Anemia    normocytic  . Anxiety    mental retardation  . Bladder wall thickening 10/06/2011  . BPH (benign prostatic hypertrophy)   . Diabetes mellitus   . Dialysis patient [facility])    Tuesday, Thursday and Saturday,   . DVT of leg (deep venous thrombosis) ([place]) 12/25/2016  . Edema     history of lower extremity edema  . GERD (gastroesophageal reflux disease)   . Heme positive stool   . Hydronephrosis   . Hyperkalemia   . Hyperlipidemia   . Hypernatremia   . Hypertension   . Hypothyroidism   . Impaired speech   . 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[place])   . MR (mental retardation)   .  Muscle weakness   . Obstructive uropathy   . 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 Poor historian poor historian  . Protein calorie malnutrition ([place])   . Pyelonephritis   . Renal failure (ARF), acute on chronic (HCC)   . Renal insufficiency    chronic history  . Sepsis ([place])   . Smoking   . Uremia   . Urinary retention   . UTI (lower urinary tract infection) 10/06/2011    Past Surgical History:  Procedure Laterality Date  . A/V FISTULAGRAM N/A 08/13/2018   Procedure: A/V FISTULAGRAM - Right Upper;  Surgeon: Elam Dutch, MD;  Location: [place] CV LAB;  Service: Cardiovascular;  Laterality: N/A;  . A/V FISTULAGRAM N/A 11/22/2018   Procedure: A/V FISTULAGRAM - Right Upper;  Surgeon: Waynetta Sandy, MD;  Location: [place] CV LAB;  Service: Cardiovascular;  Laterality: N/A;  . AV FISTULA PLACEMENT Left 07/06/2015   Procedure:  INSERTION LEFT ARM ARTERIOVENOUS GORTEX GRAFT;  Surgeon: Angelia Mould, MD;  Location: [place];  Service: Vascular;  Laterality: Left;  . AV FISTULA PLACEMENT Right 02/26/2016   Procedure: ARTERIOVENOUS (AV) FISTULA CREATION ;  Surgeon: Angelia Mould, MD;  Location: [place];  Service: Vascular;  Laterality: Right;  . AV FISTULA PLACEMENT Right 11/25/2018   Procedure: INSERTION OF ARTERIOVENOUS (AV) ARTEGRAFT RIGHT UPPER ARM;  Surgeon: Waynetta Sandy, MD;  Location: [place];  Service: Vascular;  Laterality: Right;  . [place] REMOVAL Left 10/09/2015   Procedure: REMOVAL OF ARTERIOVENOUS GORETEX GRAFT ([place]) Evacuation of Lymphocele, Vein Patch  angioplasty of brachial artery.;  Surgeon: Angelia Mould, MD;  Location: [place];  Service: Vascular;  Laterality: Left;  . BASCILIC VEIN TRANSPOSITION Right 02/26/2016   Procedure: Right BASCILIC VEIN TRANSPOSITION;  Surgeon: Angelia Mould, MD;  Location: [place];  Service: Vascular;  Laterality: Right;  . CIRCUMCISION N/A 01/04/2014   Procedure: CIRCUMCISION ADULT (procedure #1);   Surgeon: Marissa Nestle, MD;  Location: [facility];  Service: Urology;  Laterality: N/A;  . COLECTOMY N/A 05/04/2017   Procedure: TOTAL COLECTOMY;  Surgeon: Aviva Signs, MD;  Location: [facility];  Service: General;  Laterality: N/A;  . COLONOSCOPY N/A 04/27/2017   Procedure: COLONOSCOPY;  Surgeon: Daneil Dolin, MD;  [facility];  Service: Endoscopy;  Laterality: N/A;  245  . CYSTOSCOPY W/ RETROGRADES Bilateral 06/29/2015   Procedure: CYSTOSCOPY, DILATION OF URETHRAL STRICTURE WITH BILATERAL RETROGRADE PYELOGRAM,SUPRAPUBIC TUBE CHANGE;  Surgeon: Festus Aloe, MD;  Location: [facility];  Service: Urology;  Laterality: Bilateral;  . CYSTOSCOPY WITH URETHRAL DILATATION N/A 12/29/2013   Procedure: CYSTOSCOPY WITH URETHRAL DILATATION;  Surgeon: Marissa Nestle, MD;  Location: [facility];  Service: Urology;  Laterality: N/A;  . ESOPHAGOGASTRODUODENOSCOPY N/A 04/27/2017   Procedure: ESOPHAGOGASTRODUODENOSCOPY (EGD);  Surgeon: Daneil Dolin, MD;  Location: [facility];  Service: Endoscopy;  Laterality: N/A;  . INSERTION OF DIALYSIS CATHETER Right 11/25/2018   Procedure: INSERTION OF DIALYSIS CATHETER RIGHT INTERNAL JUGULAR;  Surgeon: Waynetta Sandy, MD;  Location: [place];  Service: Vascular;  Laterality: Right;  . IR AV DIALY SHUNT INTRO [place] W/PTA/IMG RIGHT Right 09/07/2018  . IR REMOVAL TUN CV CATH W/O FL  01/12/2019  . IR THROMBECTOMY AV FISTULA W/THROMBOLYSIS/PTA INC/SHUNT/IMG RIGHT Right 04/26/2018  . IR US GUIDE VASC ACCESS RIGHT  04/26/2018  . IR US GUIDE VASC ACCESS RIGHT  09/07/2018  . ORIF FEMUR FRACTURE Right 11/22/2016   Procedure: OPEN REDUCTION INTERNAL FIXATION (ORIF) DISTAL FEMUR FRACTURE;  Surgeon: Rod Can, MD;  Location: [place];  Service: Orthopedics;  Laterality: Right;  . PATCH ANGIOPLASTY Right 12/10/2017   Procedure: PATCH ANGIOPLASTY;  Surgeon: Angelia Mould, MD;  [facility]: [facility] OR;  Service: Vascular;  Laterality: Right;  . PERIPHERAL  VASCULAR BALLOON ANGIOPLASTY  08/13/2018   Procedure: PERIPHERAL VASCULAR BALLOON ANGIOPLASTY;  Surgeon: Elam Dutch, MD;  Location: [place] CV LAB;  Service: Cardiovascular;;  right AV fistula   . PERIPHERAL VASCULAR BALLOON ANGIOPLASTY  11/22/2018   Procedure: PERIPHERAL VASCULAR BALLOON ANGIOPLASTY;  Surgeon: Waynetta Sandy, MD;  Location: [place] CV LAB;  Service: Cardiovascular;;  rt AV fistula  . PERIPHERAL VASCULAR CATHETERIZATION N/A 10/08/2015   Procedure: A/V Shuntogram;  Surgeon: Angelia Mould, MD;  Location: [place] CV LAB;  Service: Cardiovascular;  Laterality: N/A;  . THROMBECTOMY W/ EMBOLECTOMY Right 12/10/2017   Procedure: THROMBECTOMY REVISION RIGHT ARM  ARTERIOVENOUS FISTULA;  Surgeon: Angelia Mould, MD;  Location: [place];  Service: Vascular;  Laterality: Right;  . TRANSURETHRAL RESECTION OF PROSTATE N/A 01/04/2014   Procedure: TRANSURETHRAL RESECTION OF THE PROSTATE (TURP) (procedure #2);  Surgeon: Marissa Nestle, MD;  Location: [facility];  Service: Urology;  Laterality: N/A;    Allergies as of 09/28/2019   No Known Allergies     Medication List    Notice   This visit is during an admission. Changes to the med list made in this visit will be reflected in the After Visit Summary of the admission.     No orders of  the defined types were placed in this encounter.   Immunization History  Administered Date(s) Administered  . Influenza,inj,Quad PF,6+ Mos 12/26/2013, 07/09/2015  . Influenza-Unspecified 07/31/2017, 07/29/2018, 08/08/2019  . PPD Test 03/07/2015  . Pneumococcal Conjugate-13 07/31/2017  . Pneumococcal Polysaccharide-23 03/04/2015  . Pneumococcal-Unspecified 12/03/2016  . Tdap 08/22/2017    Social History   Tobacco Use  . Smoking status: Never Smoker  . Smokeless tobacco: Never Used  Substance Use Topics  . Alcohol use: No    Review of Systems  GENERAL:  no fevers, fatigue, appetite changes SKIN: No  itching, rash HEENT: No complaint RESPIRATORY: No cough, wheezing, SOB CARDIAC: No chest pain, palpitations, lower extremity edema  GI: No abdominal pain, No N/V/D or constipation, No heartburn or reflux  GU: No dysuria, frequency or urgency, or incontinence  MUSCULOSKELETAL: No unrelieved bone/joint pain NEUROLOGIC: No headache, dizziness  PSYCHIATRIC: No overt anxiety or sadness  Vitals:   09/30/19 1600  BP: (!) 67/44  Pulse: 97  Resp: 16  Temp: 98.4 F (36.9 C)  SpO2: 100%   Body mass index is 34.64 kg/m. Physical Exam  GENERAL APPEARANCE: Alert, No acute distress  SKIN: No diaphoresis rash HEENT: Unremarkable RESPIRATORY: Breathing is even, unlabored. Lung sounds are clear   CARDIOVASCULAR: Heart RRR no murmurs, rubs or gallops. No peripheral edema  GASTROINTESTINAL: Abdomen is soft, non-tender, not distended w/ normal bowel sounds.  GENITOURINARY: Bladder non tender, not distended  MUSCULOSKELETAL: No abnormal joints or musculature NEUROLOGIC: Cranial nerves 2-12 grossly intact. Moves all extremities; some cognitive delay PSYCHIATRIC: Mood and affect appropriate to situation, no behavioral issues  Patient Active Problem List   Diagnosis Date Noted  . Medicare annual wellness visit, subsequent 09/26/2019  . Depression, major, single episode, moderate ([place]) 08/09/2019  . Type 2 diabetes mellitus with hypertension and end stage renal disease on dialysis ([place]) 07/31/2019  . Orthostatic hypotension 02/17/2019  . GERD without esophagitis 02/17/2019  . Dyslipidemia associated with type 2 diabetes mellitus ([place]) 02/17/2019  . Bilateral lower extremity edema 02/17/2019  . End stage renal disease on dialysis due to type 2 diabetes mellitus ([place]) 02/13/2019  . Hypertriglyceridemia 01/25/2019  . Gastroenteritis   . Hypothyroidism 01/19/2018  . Enteritis 01/19/2018  . S/P colectomy 08/31/2017  . Malignant neoplasm of colon ([place]) 06/25/2017  . Colitis 05/17/2017  .  Dysplastic polyp of colon   . ESRD (end stage renal disease) ([place]) 05/01/2017  . Colonoscopy causing post-procedural bleeding   . Lower GI bleed   . Anemia in chronic kidney disease 08/23/2016  . Dependence on renal dialysis ([place]) 08/23/2016  . Type 2 diabetes mellitus with unspecified diabetic retinopathy without macular edema ([place]) 08/23/2016  . Hydronephrosis, bilateral 03/10/2016  . Essential (primary) hypertension 07/08/2015  . Hyperlipidemia 07/08/2015  . Obstructive and reflux uropathy, unspecified 04/17/2015  . Bladder neck contracture   . Benign prostatic hyperplasia with urinary retention 03/03/2015  . Intellectual disability 10/10/2011  . Acute pyelonephritis 10/07/2011  . Iron deficiency anemia 10/02/2011    CMP     Component Value Date/Time   NA 135 09/16/2019 0634   K 3.7 09/16/2019 0634   CL 95 (L) 09/16/2019 0634   CO2 25 09/16/2019 0634   GLUCOSE 165 (H) 09/16/2019 0634   BUN 40 (H) 09/16/2019 0634   CREATININE 10.47 (H) 09/16/2019 0634   CALCIUM 10.4 (H) 09/16/2019 0634   CALCIUM 8.3 (L) 03/03/2015 0622   PROT 9.3 (H) 09/16/2019 0634   ALBUMIN 4.6 09/16/2019 0634   AST 20  09/16/2019 0634   ALT 32 09/16/2019 0634   ALKPHOS 107 09/16/2019 0634   BILITOT 1.0 09/16/2019 0634   GFRNONAA 4 (L) 09/16/2019 0634   GFRAA 5 (L) 09/16/2019 0634   Recent Labs    01/25/19 0700 06/03/19 0325 09/16/19 0634  NA 137 137 135  K 4.7 4.6 3.7  CL 97* 102 95*  CO2 28 26 25   GLUCOSE 334* 160* 165*  BUN 25* 30* 40*  CREATININE 6.78* 8.46* 10.47*  CALCIUM 9.0 9.1 10.4*   Recent Labs    01/25/19 0700 06/03/19 0325 09/16/19 0634  AST 21 19 20   ALT 27 25 32  ALKPHOS 125 96 107  BILITOT 0.5 0.5 1.0  PROT 8.0 6.9 9.3*  ALBUMIN 4.0 3.6 4.6   Recent Labs    10/23/18 0504 01/25/19 0700 06/03/19 0325 09/16/19 0634  WBC 5.4 5.6 6.3 8.0  NEUTROABS 2.3  --   --  5.5  HGB 11.5* 12.9* 10.8* 16.4  HCT 37.7* 40.8 33.4* 50.6  MCV 109.0* 110.3* 106.7* 107.2*  PLT  145* 146* 132* 141*   Recent Labs    10/23/18 0504 01/25/19 0700 06/03/19 0325  CHOL 86 94 115  LDLCALC 20 UNABLE TO CALCULATE IF TRIGLYCERIDE OVER 400 mg/dL UNABLE TO CALCULATE IF TRIGLYCERIDE OVER 400 mg/dL  TRIG 197* 439* 531*   No results found for: [facility] Lab Results  Component Value Date   TSH 2.752 12/06/2018   Lab Results  Component Value Date   HGBA1C 7.5 (H) 09/07/2019   Lab Results  Component Value Date   CHOL 115 06/03/2019   HDL 24 (L) 06/03/2019   LDLCALC UNABLE TO CALCULATE IF TRIGLYCERIDE OVER 400 mg/dL 06/03/2019   LDLDIRECT 25.5 06/03/2019   TRIG 531 (H) 06/03/2019   CHOLHDL 4.8 06/03/2019    Significant Diagnostic Results in last 30 days:  DG Chest Port 1 View  Result Date: 09/16/2019 CLINICAL DATA:  Vomiting.  Dialysis EXAM: PORTABLE CHEST 1 VIEW COMPARISON:  11/25/2018 FINDINGS: Normal heart size. Mild convexity upper right mediastinum that is stable and likely vascular. Normal aortic contours. There is no edema, consolidation, effusion, or pneumothorax. IMPRESSION: No active disease. Electronically Signed   By: Monte Fantasia M.D.   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

## 2019-09-30 NOTE — Progress Notes (Signed)
This encounter was created in error - please disregard.

## 2019-10-02 ENCOUNTER — Other Ambulatory Visit (HOSPITAL_COMMUNITY)
Admission: RE | Admit: 2019-10-02 | Discharge: 2019-10-02 | Disposition: A | Discharge status: Home / Self Care | Payer: Medicare Other | Source: Ambulatory Visit | Attending: Internal Medicine | Admitting: Internal Medicine

## 2019-10-02 ENCOUNTER — Other Ambulatory Visit: Payer: Self-pay | Admitting: Internal Medicine

## 2019-10-02 DIAGNOSIS — Z20828 Contact with and (suspected) exposure to other viral communicable diseases: Secondary | ICD-10-CM | POA: Insufficient documentation

## 2019-10-02 DIAGNOSIS — Z9189 Other specified personal risk factors, not elsewhere classified: Secondary | ICD-10-CM

## 2019-10-04 LAB — SARS CORONAVIRUS 2 (TAT 6-24 HRS): SARS Coronavirus 2: NEGATIVE

## 2019-10-05 ENCOUNTER — Non-Acute Institutional Stay (SKILLED_NURSING_FACILITY): Payer: Medicare Other | Admitting: Adult Health

## 2019-10-05 DIAGNOSIS — N186 End stage renal disease: Secondary | ICD-10-CM | POA: Diagnosis not present

## 2019-10-05 DIAGNOSIS — Z992 Dependence on renal dialysis: Secondary | ICD-10-CM

## 2019-10-05 DIAGNOSIS — E1122 Type 2 diabetes mellitus with diabetic chronic kidney disease: Secondary | ICD-10-CM | POA: Diagnosis not present

## 2019-10-05 DIAGNOSIS — F79 Unspecified intellectual disabilities: Secondary | ICD-10-CM

## 2019-10-05 DIAGNOSIS — I120 Hypertensive chronic kidney disease with stage 5 chronic kidney disease or end stage renal disease: Secondary | ICD-10-CM

## 2019-10-05 NOTE — Progress Notes (Signed)
Location:  [facility] Room Number: 112-D Place of Service:  SNF (31)   CODE STATUS:  FULL CODE  No Known Allergies  Chief Complaint  Patient presents with   Acute Visit     care plan meeting      HPI:  We have come together for his care plan meeting. BIMS 8/15 mood 3/30. His weight has been stable over the past year. No falls. No reports of uncontrolled pain; no changes in his appetite. No reports of anxiety or agitation. He continues to be followed his chronic illnesses including: intellectual disability; endstage renal disaese; diabetes.   Past Medical History:  Diagnosis Date   Abnormal CT scan, kidney 10/06/2011   Acute pyelonephritis 10/07/2011   Anemia    normocytic   Anxiety    mental retardation   Bladder wall thickening 10/06/2011   BPH (benign prostatic hypertrophy)    Diabetes mellitus    Dialysis patient ([place])    Tuesday, Thursday and Saturday,    DVT of leg (deep venous thrombosis) ([place]) 12/25/2016   Edema     history of lower extremity edema   GERD (gastroesophageal reflux disease)    Heme positive stool    Hydronephrosis    Hyperkalemia    Hyperlipidemia    Hypernatremia    Hypertension    Hypothyroidism    Impaired speech    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)    MR (mental retardation)    Muscle weakness    Obstructive uropathy    Perinephric abscess 10/07/2011   Poor historian poor historian   Protein calorie malnutrition ([place])    Pyelonephritis    Renal failure (ARF), acute on chronic (HCC)    Renal insufficiency    chronic history   Sepsis ([place])    Smoking    Uremia    Urinary retention    UTI (lower urinary tract infection) 10/06/2011    Past Surgical History:  Procedure Laterality Date   A/V FISTULAGRAM N/A 08/13/2018   Procedure: A/V FISTULAGRAM - Right Upper;  Surgeon: Elam Dutch, MD;  Location: [facility];  Service: Cardiovascular;  Laterality: N/A;     A/V FISTULAGRAM N/A 11/22/2018   Procedure: A/V FISTULAGRAM - Right Upper;  Surgeon: Waynetta Sandy, MD;  Location: [facility];  Service: Cardiovascular;  Laterality: N/A;   AV FISTULA PLACEMENT Left 07/06/2015   Procedure:  INSERTION LEFT ARM ARTERIOVENOUS GORTEX GRAFT;  Surgeon: Angelia Mould, MD;  Location: [facility];  Service: Vascular;  Laterality: Left;   AV FISTULA PLACEMENT Right 02/26/2016   Procedure: ARTERIOVENOUS (AV) FISTULA CREATION ;  Surgeon: Angelia Mould, MD;  Location: [facility];  Service: Vascular;  Laterality: Right;   AV FISTULA PLACEMENT Right 11/25/2018   Procedure: INSERTION OF ARTERIOVENOUS (AV) ARTEGRAFT RIGHT UPPER ARM;  Surgeon: Waynetta Sandy, MD;  Location: [facility];  Service: Vascular;  Laterality: Right;   [place] REMOVAL Left 10/09/2015   Procedure: REMOVAL OF ARTERIOVENOUS GORETEX GRAFT ([place]) Evacuation of Lymphocele, Vein Patch angioplasty of brachial artery.;  Surgeon: Angelia Mould, MD;  Location: [place];  Service: Vascular;  Laterality: Left;   [place] Right 02/26/2016   Procedure: Right [place];  Surgeon: Angelia Mould, MD;  Location: [facility];  Service: Vascular;  Laterality: Right;   CIRCUMCISION N/A 01/04/2014   Procedure: CIRCUMCISION ADULT (procedure #1);  Surgeon: Marissa Nestle, MD;  Location[facility];  Service: Urology;  Laterality: N/A;  COLECTOMY N/A 05/04/2017   Procedure: TOTAL COLECTOMY;  Surgeon: Aviva Signs, MD;  Location[facility];  Service: General;  Laterality: N/A;   COLONOSCOPY N/A 04/27/2017   Procedure: COLONOSCOPY;  Surgeon: Daneil Dolin, MD;  [facility];  Service: Endoscopy;  Laterality: N/A;  245   CYSTOSCOPY W/ RETROGRADES Bilateral 06/29/2015   Procedure: CYSTOSCOPY, DILATION OF URETHRAL STRICTURE WITH BILATERAL RETROGRADE PYELOGRAM,SUPRAPUBIC TUBE CHANGE;  Surgeon: Festus Aloe, MD;  Location: [facility];  Service: Urology;   Laterality: Bilateral;   CYSTOSCOPY WITH URETHRAL DILATATION N/A 12/29/2013   Procedure: CYSTOSCOPY WITH URETHRAL DILATATION;  Surgeon: Marissa Nestle, MD;  [facility];  Service: Urology;  Laterality: N/A;   ESOPHAGOGASTRODUODENOSCOPY N/A 04/27/2017   Procedure: ESOPHAGOGASTRODUODENOSCOPY (EGD);  Surgeon: Daneil Dolin, MD;  [facility];  Service: Endoscopy;  Laterality: N/A;   INSERTION OF DIALYSIS CATHETER Right 11/25/2018   Procedure: INSERTION OF DIALYSIS CATHETER RIGHT INTERNAL JUGULAR;  Surgeon: Waynetta Sandy, MD;  Location: [facility];  Service: Vascular;  Laterality: Right;   IR AV DIALY SHUNT INTRO NEEDLE/INTRACATH INITIAL W/PTA/IMG RIGHT Right 09/07/2018   IR REMOVAL TUN CV CATH W/O FL  01/12/2019   IR THROMBECTOMY AV FISTULA W/THROMBOLYSIS/PTA INC/SHUNT/IMG RIGHT Right 04/26/2018   IR US GUIDE VASC ACCESS RIGHT  04/26/2018   IR US GUIDE VASC ACCESS RIGHT  09/07/2018   ORIF FEMUR FRACTURE Right 11/22/2016   Procedure: OPEN REDUCTION INTERNAL FIXATION (ORIF) DISTAL FEMUR FRACTURE;  Surgeon: Rod Can, MD;  Location: [facility];  Service: Orthopedics;  Laterality: Right;   PATCH ANGIOPLASTY Right 12/10/2017   Procedure: PATCH ANGIOPLASTY;  Surgeon: Angelia Mould, MD;  Location: [place] [facility];  Service: Vascular;  Laterality: Right;   PERIPHERAL VASCULAR BALLOON ANGIOPLASTY  08/13/2018   Procedure: PERIPHERAL VASCULAR BALLOON ANGIOPLASTY;  Surgeon: Elam Dutch, MD;  Location: [place] [facility];  Service: Cardiovascular;;  right AV fistula    PERIPHERAL VASCULAR BALLOON ANGIOPLASTY  11/22/2018   Procedure: PERIPHERAL VASCULAR BALLOON ANGIOPLASTY;  Surgeon: Waynetta Sandy, MD;  Location: [facility];  Service: Cardiovascular;;  rt AV fistula   PERIPHERAL VASCULAR CATHETERIZATION N/A 10/08/2015   Procedure: A/V Shuntogram;  Surgeon: Angelia Mould, MD;  Location: [place] [facility];  Service: Cardiovascular;  Laterality: N/A;     THROMBECTOMY W/ EMBOLECTOMY Right 12/10/2017   Procedure: THROMBECTOMY REVISION RIGHT ARM  ARTERIOVENOUS FISTULA;  Surgeon: Angelia Mould, MD;  Location: [facility] OR;  Service: Vascular;  Laterality: Right;   TRANSURETHRAL RESECTION OF PROSTATE N/A 01/04/2014   Procedure: TRANSURETHRAL RESECTION OF THE PROSTATE (TURP) (procedure #2);  Surgeon: Marissa Nestle, MD;  [facility];  Service: Urology;  Laterality: N/A;    Social History   Socioeconomic History   Marital status: Single    Spouse name: Not on file   Number of children: Not on file   Years of education: Not on file   Highest education level: Not on file  Occupational History   Occupation: retired   Tobacco Use   Smoking status: Never Smoker   Smokeless tobacco: Never Used  Substance and Sexual Activity   Alcohol use: No   Drug use: No   Sexual activity: Not Currently  Other Topics Concern   Not on file  Social History Narrative   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   Long term resident of [facility]    Social Determinants of Health   Financial Resource Strain: Low Risk  Difficulty of Paying Living Expenses: Not hard at all  Food Insecurity: No Food Insecurity   Worried About [place] in the Last Year: Never true   Ran Out of Food in the Last Year: Never true  Transportation Needs: No Transportation Needs   Lack of Transportation (Medical): No   Lack of Transportation (Non-Medical): No  Physical Activity: Inactive   Days of Exercise per Week: 0 days   Minutes of Exercise per Session: 0 min  Stress: No Stress Concern Present   Feeling of Stress : Not at all  Social Connections: Moderately Isolated   Frequency of Communication with Friends and Family: More than three times a week   Frequency of Social Gatherings with Friends and Family: Never   Attends Religious Services: Never   Marine scientist or Organizations: No    Attends Music therapist: Never   Marital Status: Never married  Human resources officer Violence: Not At Risk   Fear of Current or Ex-Partner: No   Emotionally Abused: No   Physically Abused: No   Sexually Abused: No   Family History  Problem Relation Age of Onset   Cancer Mother    Colon cancer Neg Hx       VITAL SIGNS BP (!) 85/62    Pulse 92    Temp (!) 97.2 F (36.2 C) (Oral)    Resp 17    Ht 5\' 8"  (1.727 m)    Wt 210 lb (95.3 kg)    SpO2 100%    BMI 31.93 kg/m   Outpatient Encounter Medications as of 10/05/2019  Medication Sig   atorvastatin (LIPITOR) 80 MG tablet Take 80 mg by mouth every evening.   Cholecalciferol (VITAMIN D3) 1.25 MG (50000 UT) CAPS Take 1 capsule by mouth daily.   insulin aspart (NOVOLOG FLEXPEN) 100 UNIT/ML FlexPen Inject 13 Units into the skin 3 (three) times daily with meals.    Insulin Glargine (BASAGLAR KWIKPEN) 100 UNIT/ML SOPN Inject 30 Units into the skin at bedtime.    JANUVIA 25 MG tablet Take 25 mg by mouth daily.    levothyroxine (SYNTHROID, LEVOTHROID) 88 MCG tablet Take 88 mcg by mouth daily before breakfast.   midodrine (PROAMATINE) 10 MG tablet Take 10 mg by mouth. Send med with resident on Mon/Wed/Fri to dialysis. do not give [facility] give to resident to take with him. send with 5mg  to equal 15mg .   midodrine (PROAMATINE) 5 MG tablet Take 5 mg by mouth. Send with resident on dialysis days.  Mon, Wed, Fri.  Take along with 10 mg to equal 15 mg   Multiple Vitamin (MULTIVITAMIN WITH MINERALS) TABS tablet Take 1 tablet by mouth every evening.    NON FORMULARY Fluid Restriction - 1200 cc a day 500 cc for days, Evenings 500 cc, Night 200 cc   NON FORMULARY Diet Type:  NAS, Cons CHO Diet   Nutritional Supplements (NEPRO/CARBSTEADY PO) Take 1 Can by mouth 2 (two) times daily.   omega-3 acid ethyl esters (LOVAZA) 1 g capsule 1 gram; amt: 2 capsules; oral  Special Instructions: elevated triglycerides At  Bedtime 09:00 PM   omeprazole (PRILOSEC) 40 MG capsule Take 40 mg by mouth at bedtime.    polyethylene glycol (MIRALAX / GLYCOLAX) packet Take 17 g by mouth as needed for mild constipation.    sertraline (ZOLOFT) 50 MG tablet Take 50 mg by mouth daily.   sevelamer carbonate (RENVELA) 800 MG tablet Take 1,600 mg by mouth 3 (  three) times daily.    sevelamer carbonate (RENVELA) 800 MG tablet Take 800 mg by mouth daily. With snacks   tamsulosin (FLOMAX) 0.4 MG CAPS capsule Take 0.4 mg by mouth every evening. Give 30 minutes after a meal   torsemide (DEMADEX) 10 MG tablet Take 5 tablets (50 mg total) by mouth daily.   vitamin C (ASCORBIC ACID) 500 MG tablet Take 500 mg by mouth daily.   zinc sulfate 220 (50 Zn) MG capsule Take 220 mg by mouth daily.   No facility-administered encounter medications on file as of 10/05/2019.     SIGNIFICANT DIAGNOSTIC EXAMS   LABS REVIEWED PREVIOUS;  12-06-18: tsh 2.752 01-25-19: wbc 5.6; hgb 12.9 hct 40.8; mcv 110.3; plt 146; glucose 334; bun 25; creat 6.78; k+ 4.7; na++ 137; ca 9.0 liver normal albumin 4.0  Hgb a1c 6.9; chol 94 trig 439 LDL direct 16.3 05-10-19 hgb a1c 7.6   06-03-19: wbc 6.3; hgb 10.8; hct 33.4; mcv 106.7; plt 132; glucose 160; bun 30; creat 8.46; k+ 4.6; na++ 137; ca 9.1; liver normal albumin 3.6; chol 115; LDL 25.5; trig 531; hdl 24; PSA 2.73  NO NEW LABS.   Review of Systems  Constitutional: Negative for malaise/fatigue.  Respiratory: Negative for cough and shortness of breath.   Cardiovascular: Negative for chest pain, palpitations and leg swelling.  Gastrointestinal: Negative for abdominal pain, constipation and heartburn.  Musculoskeletal: Negative for back pain, joint pain and myalgias.  Skin: Negative.   Neurological: Negative for dizziness.  Psychiatric/Behavioral: The patient is not nervous/anxious.     Physical Exam Constitutional:      General: He is not in acute distress.    Appearance: He is well-developed. He  is not diaphoretic.  Neck:     Thyroid: No thyromegaly.  Cardiovascular:     Rate and Rhythm: Normal rate and regular rhythm.     Pulses: Normal pulses.     Heart sounds: Normal heart sounds.  Pulmonary:     Effort: Pulmonary effort is normal. No respiratory distress.     Breath sounds: Normal breath sounds.  Abdominal:     General: Bowel sounds are normal. There is no distension.     Palpations: Abdomen is soft.     Tenderness: There is no abdominal tenderness.     Comments: History of colectomy   Genitourinary:    Comments: History of turp Musculoskeletal:     Cervical back: Neck supple.     Right lower leg: No edema.     Left lower leg: No edema.     Comments: Is able to move all extremities History of right femur ORIF    Lymphadenopathy:     Cervical: No cervical adenopathy.  Skin:    General: Skin is warm and dry.     Comments: Right upper extremity a/v fistula + thrill + bruit  Neurological:     Mental Status: He is alert. Mental status is at baseline.  Psychiatric:        Mood and Affect: Mood normal.        ASSESSMENT/ PLAN:  TODAY  1. 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 intellectual disability 3. End stage renal disease due to type 2 diabetes mellitus  Will continue current medications Will continue current plan of care Will continue to monitor his status    MD is aware of resident's narcotic use and is in agreement with current plan of care. We will attempt to wean resident as  appropriate.  Ok Edwards NP [facility]  Contact [phone] Monday through Friday 8am- 5pm  After hours call [phone]

## 2019-10-08 ENCOUNTER — Other Ambulatory Visit (HOSPITAL_COMMUNITY)
Admission: RE | Admit: 2019-10-08 | Discharge: 2019-10-08 | Disposition: A | Discharge status: Home / Self Care | Payer: Medicare Other | Source: Ambulatory Visit | Attending: Internal Medicine | Admitting: Internal Medicine

## 2019-10-08 ENCOUNTER — Other Ambulatory Visit: Payer: Self-pay | Admitting: Internal Medicine

## 2019-10-08 ENCOUNTER — Encounter: Payer: Self-pay | Admitting: Internal Medicine

## 2019-10-08 DIAGNOSIS — Z9189 Other specified personal risk factors, not elsewhere classified: Secondary | ICD-10-CM

## 2019-10-08 DIAGNOSIS — Z20828 Contact with and (suspected) exposure to other viral communicable diseases: Secondary | ICD-10-CM | POA: Diagnosis present

## 2019-10-08 LAB — SARS CORONAVIRUS 2 (TAT 6-24 HRS): SARS Coronavirus 2: NEGATIVE

## 2019-10-08 NOTE — Assessment & Plan Note (Signed)
Monday Wednesday Friday dialysis; patient takes midodrine 15 mg on those days for hypotension

## 2019-10-08 NOTE — Assessment & Plan Note (Signed)
Hemoglobin A1c 7.5; continue Lantus insulin 30 units daily, NovoLog 13 units with each meal and Januvia 25 mg daily; patient is on statin

## 2019-10-08 NOTE — Assessment & Plan Note (Signed)
TSH 2.75-controlled; continue levothyroxine 88 mcg daily

## 2019-10-14 LAB — NOVEL CORONAVIRUS, NAA (HOSP ORDER, SEND-OUT TO REF LAB; TAT 18-24 HRS): SARS-CoV-2, NAA: NOT DETECTED

## 2019-10-19 ENCOUNTER — Encounter: Payer: Self-pay | Admitting: Adult Health

## 2019-10-19 ENCOUNTER — Non-Acute Institutional Stay (SKILLED_NURSING_FACILITY): Payer: Medicare Other | Admitting: Adult Health

## 2019-10-19 DIAGNOSIS — Z794 Long term (current) use of insulin: Secondary | ICD-10-CM | POA: Diagnosis not present

## 2019-10-19 DIAGNOSIS — E1122 Type 2 diabetes mellitus with diabetic chronic kidney disease: Secondary | ICD-10-CM | POA: Diagnosis not present

## 2019-10-19 DIAGNOSIS — Z992 Dependence on renal dialysis: Secondary | ICD-10-CM

## 2019-10-19 DIAGNOSIS — E11319 Type 2 diabetes mellitus with unspecified diabetic retinopathy without macular edema: Secondary | ICD-10-CM

## 2019-10-19 DIAGNOSIS — N186 End stage renal disease: Secondary | ICD-10-CM | POA: Diagnosis not present

## 2019-10-19 LAB — NOVEL CORONAVIRUS, NAA (HOSP ORDER, SEND-OUT TO REF LAB; TAT 18-24 HRS): SARS-CoV-2, NAA: NOT DETECTED

## 2019-10-19 NOTE — Progress Notes (Signed)
Location:    [place] Room Number: 112/D Place of Service:  SNF (31)   CODE STATUS: Full Code  No Known Allergies  Chief Complaint  Patient presents with  . Acute Visit    COVID Vaccine Permission    HPI:  We have the mederna vaccine available. His risk factors include: long term resident of snf; diabetes and esrd. I have discussed with his case worker at [facility] the 2 injection process; side effects; and possible adverse reactions. She has verbalized understanding and is in agreement with him receiving the vaccine. He denies any uncontrolled pain; no changes in appetite; no cough or shortness of breath.   Past Medical History:  Diagnosis Date  . Abnormal CT scan, kidney 10/06/2011  . Acute pyelonephritis 10/07/2011  . Anemia    normocytic  . Anxiety    mental retardation  . Bladder wall thickening 10/06/2011  . BPH (benign prostatic hypertrophy)   . Diabetes mellitus   . Dialysis patient [facility])    Tuesday, Thursday and Saturday,   . DVT of leg (deep venous thrombosis) ([place]) 12/25/2016  . Edema     history of lower extremity edema  . GERD (gastroesophageal reflux disease)   . Heme positive stool   . Hydronephrosis   . Hyperkalemia   . Hyperlipidemia   . Hypernatremia   . Hypertension   . Hypothyroidism   . Impaired speech   . 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 MR (mental retardation)   . Muscle weakness   . Obstructive uropathy   . 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 Poor historian poor historian  . Protein calorie malnutrition ([place])   . Pyelonephritis   . Renal failure (ARF), acute on chronic (HCC)   . Renal insufficiency    chronic history  . Sepsis ([place])   . Smoking   . Uremia   . Urinary retention   . UTI (lower urinary tract infection) 10/06/2011    Past Surgical History:  Procedure Laterality Date  . A/V FISTULAGRAM N/A 08/13/2018   Procedure: A/V FISTULAGRAM - Right Upper;  Surgeon: Elam Dutch, MD;  Location: [place] CV LAB;  Service: Cardiovascular;  Laterality: N/A;  . A/V FISTULAGRAM N/A 11/22/2018   Procedure: A/V FISTULAGRAM - Right Upper;  Surgeon: Waynetta Sandy, MD;  Location: [place] CV LAB;  Service: Cardiovascular;  Laterality: N/A;  . AV FISTULA PLACEMENT Left 07/06/2015   Procedure:  INSERTION LEFT ARM ARTERIOVENOUS GORTEX GRAFT;  Surgeon: Angelia Mould, MD;  Location: [place];  Service: Vascular;  Laterality: Left;  . AV FISTULA PLACEMENT Right 02/26/2016   Procedure: ARTERIOVENOUS (AV) FISTULA CREATION ;  Surgeon: Angelia Mould, MD;  Location: [place];  Service: Vascular;  Laterality: Right;  . AV FISTULA PLACEMENT Right 11/25/2018   Procedure: INSERTION OF ARTERIOVENOUS (AV) ARTEGRAFT RIGHT UPPER ARM;  Surgeon: Waynetta Sandy, MD;  Location: [place];  Service: Vascular;  Laterality: Right;  . [place] REMOVAL Left 10/09/2015   Procedure: REMOVAL OF ARTERIOVENOUS GORETEX GRAFT ([place]) Evacuation of Lymphocele, Vein Patch angioplasty of brachial artery.;  Surgeon: Angelia Mould, MD;  Location: [place];  Service: Vascular;  Laterality: Left;  . BASCILIC VEIN TRANSPOSITION Right 02/26/2016   Procedure: Right BASCILIC VEIN TRANSPOSITION;  Surgeon: Angelia Mould, MD;  Location: [place];  Service: Vascular;  Laterality: Right;  . CIRCUMCISION N/A 01/04/2014   Procedure: CIRCUMCISION ADULT (procedure #1);  Surgeon: Marissa Nestle, MD;  [facility];  Service: Urology;  Laterality: N/A;  . COLECTOMY N/A 05/04/2017   Procedure: TOTAL COLECTOMY;  Surgeon: Aviva Signs, MD;  Location: [facility];  Service: General;  Laterality: N/A;  . COLONOSCOPY N/A 04/27/2017   Procedure: COLONOSCOPY;  Surgeon: Daneil Dolin, MD;  [facility];  Service: Endoscopy;  Laterality: N/A;  245  . CYSTOSCOPY W/ RETROGRADES Bilateral 06/29/2015   Procedure: CYSTOSCOPY, DILATION OF URETHRAL STRICTURE WITH BILATERAL RETROGRADE PYELOGRAM,SUPRAPUBIC TUBE CHANGE;  Surgeon:  Festus Aloe, MD;  Location: [facility];  Service: Urology;  Laterality: Bilateral;  . CYSTOSCOPY WITH URETHRAL DILATATION N/A 12/29/2013   Procedure: CYSTOSCOPY WITH URETHRAL DILATATION;  Surgeon: Marissa Nestle, MD;  [facility]: [facility];  Service: Urology;  Laterality: N/A;  . ESOPHAGOGASTRODUODENOSCOPY N/A 04/27/2017   Procedure: ESOPHAGOGASTRODUODENOSCOPY (EGD);  Surgeon: Daneil Dolin, MD;  [facility];  Service: Endoscopy;  Laterality: N/A;  . INSERTION OF DIALYSIS CATHETER Right 11/25/2018   Procedure: INSERTION OF DIALYSIS CATHETER RIGHT INTERNAL JUGULAR;  Surgeon: Waynetta Sandy, MD;  Location: [place];  Service: Vascular;  Laterality: Right;  . IR AV DIALY SHUNT INTRO [place] W/PTA/IMG RIGHT Right 09/07/2018  . IR REMOVAL TUN CV CATH W/O FL  01/12/2019  . IR THROMBECTOMY AV FISTULA W/THROMBOLYSIS/PTA INC/SHUNT/IMG RIGHT Right 04/26/2018  . IR US GUIDE VASC ACCESS RIGHT  04/26/2018  . IR US GUIDE VASC ACCESS RIGHT  09/07/2018  . ORIF FEMUR FRACTURE Right 11/22/2016   Procedure: OPEN REDUCTION INTERNAL FIXATION (ORIF) DISTAL FEMUR FRACTURE;  Surgeon: Rod Can, MD;  Location: [place];  Service: Orthopedics;  Laterality: Right;  . PATCH ANGIOPLASTY Right 12/10/2017   Procedure: PATCH ANGIOPLASTY;  Surgeon: Angelia Mould, MD;  [facility];  Service: Vascular;  Laterality: Right;  . PERIPHERAL VASCULAR BALLOON ANGIOPLASTY  08/13/2018   Procedure: PERIPHERAL VASCULAR BALLOON ANGIOPLASTY;  Surgeon: Elam Dutch, MD;  Location: [place] CV LAB;  Service: Cardiovascular;;  right AV fistula   . PERIPHERAL VASCULAR BALLOON ANGIOPLASTY  11/22/2018   Procedure: PERIPHERAL VASCULAR BALLOON ANGIOPLASTY;  Surgeon: Waynetta Sandy, MD;  Location: [place] CV LAB;  Service: Cardiovascular;;  rt AV fistula  . PERIPHERAL VASCULAR CATHETERIZATION N/A 10/08/2015   Procedure: A/V Shuntogram;  Surgeon: Angelia Mould, MD;  Location: [place] CV LAB;  Service: Cardiovascular;  Laterality: N/A;  . THROMBECTOMY W/ EMBOLECTOMY Right 12/10/2017   Procedure: THROMBECTOMY REVISION RIGHT ARM  ARTERIOVENOUS FISTULA;  Surgeon: Angelia Mould, MD;  Location: [place];  Service: Vascular;  Laterality: Right;  . TRANSURETHRAL RESECTION OF PROSTATE N/A 01/04/2014   Procedure: TRANSURETHRAL RESECTION OF THE PROSTATE (TURP) (procedure #2);  Surgeon: Marissa Nestle, MD;  [facility];  Service: Urology;  Laterality: N/A;    Social History   Socioeconomic History  . Marital status: Single    Spouse name: Not on file  . Number of children: Not on file  . Years of education: Not on file  . Highest education level: Not on file  Occupational History  . Occupation: retired   Tobacco Use  . Smoking status: Never Smoker  . Smokeless tobacco: Never Used  Substance and Sexual Activity  . Alcohol use: No  . Drug use: No  . Sexual activity: Not Currently  Other Topics Concern  . 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   [facility] resident of [facility]    Social Determinants of Health  Financial Resource Strain:   . Difficulty of Paying Living Expenses: Not on file  Food Insecurity:   . Worried About Charity fundraiser in the Last Year: Not on file  . Ran Out of Food in the Last Year: Not on file  Transportation Needs:   . Lack of Transportation (Medical): Not on file  . Lack of Transportation (Non-Medical): Not on file  Physical Activity: Unknown  . Days of Exercise per Week: Not on file  . Minutes of Exercise per Session: 0 min  Stress:   . Feeling of Stress : Not on file  Social Connections: Unknown  . Frequency of Communication with Friends and Family: Not on file  . Frequency of Social Gatherings with Friends and Family: Never  . Attends Religious Services: Not on file  . Active Member of Clubs or Organizations: Not on file  . Attends English as a second language teacher Meetings: Not on file  . Marital Status: Not on file  Intimate Partner Violence:   . Fear of Current or Ex-Partner: Not on file  . Emotionally Abused: Not on file  . Physically Abused: Not on file  . Sexually Abused: Not on file   Family History  Problem Relation Age of Onset  . Cancer Mother   . Colon cancer Neg Hx       VITAL SIGNS BP (!) 92/57   Pulse 86   Temp (!) 96.8 F (36 C) (Oral)   Resp 20   Ht 5\' 8"  (1.727 m)   Wt 210 lb (95.3 kg)   SpO2 100%   BMI 31.93 kg/m   Outpatient Encounter Medications as of 10/19/2019  Medication Sig  . atorvastatin (LIPITOR) 80 MG tablet Take 80 mg by mouth every evening.  . Cholecalciferol (VITAMIN D3) 1.25 MG (50000 UT) CAPS Take 1 capsule by mouth once a week.   . insulin aspart (NOVOLOG FLEXPEN) 100 UNIT/ML FlexPen Inject 13 Units into the skin 3 (three) times daily with meals.   . Insulin Glargine (BASAGLAR KWIKPEN) 100 UNIT/ML SOPN Inject 30 Units into the skin at bedtime.   Marland Kitchen JANUVIA 25 MG tablet Take 25 mg by mouth daily.   Marland Kitchen levothyroxine (SYNTHROID, LEVOTHROID) 88 MCG tablet Take 88 mcg by mouth daily before breakfast.  . midodrine (PROAMATINE) 10 MG tablet Take 10 mg by mouth. Send med with resident on Mon/Wed/Fri to dialysis. do not give [facility] give to resident to take with him. send with 5mg  to equal 15mg .  . midodrine (PROAMATINE) 5 MG tablet Take 5 mg by mouth. Send with resident on dialysis days.  Mon, Wed, Fri.  Take along with 10 mg to equal 15 mg  . Multiple Vitamin (MULTIVITAMIN WITH MINERALS) TABS tablet Take 1 tablet by mouth every evening.   . NON FORMULARY Fluid Restriction - 1200 cc a day 500 cc for days, Evenings 500 cc, Night 200 cc  . NON FORMULARY Diet Type:  NAS, Cons CHO Diet  . Nutritional Supplements (NEPRO/CARBSTEADY PO) Take 1 Can by mouth 2 (two) times daily.  Marland Kitchen omega-3 acid ethyl esters (LOVAZA) 1 g capsule 1 gram; amt: 2 capsules; oral  Special Instructions: elevated  triglycerides At Bedtime 09:00 PM  . omeprazole (PRILOSEC) 40 MG capsule Take 40 mg by mouth at bedtime.   . polyethylene glycol (MIRALAX / GLYCOLAX) packet Take 17 g by mouth as needed for mild constipation.   . sertraline (ZOLOFT) 50 MG tablet Take 50 mg by mouth daily.  . sevelamer carbonate (  RENVELA) 800 MG tablet Take 1,600 mg by mouth 3 (three) times daily.   . sevelamer carbonate (RENVELA) 800 MG tablet Take 800 mg by mouth daily. With snacks  . tamsulosin (FLOMAX) 0.4 MG CAPS capsule Take 0.4 mg by mouth every evening. Give 30 minutes after a meal  . torsemide (DEMADEX) 10 MG tablet Take 5 tablets (50 mg total) by mouth daily.  . [DISCONTINUED] vitamin C (ASCORBIC ACID) 500 MG tablet Take 500 mg by mouth daily.  . [DISCONTINUED] zinc sulfate 220 (50 Zn) MG capsule Take 220 mg by mouth daily.   No facility-administered encounter medications on file as of 10/19/2019.     SIGNIFICANT DIAGNOSTIC EXAMS   LABS REVIEWED PREVIOUS;  12-06-18: tsh 2.752 01-25-19: wbc 5.6; hgb 12.9 hct 40.8; mcv 110.3; plt 146; glucose 334; bun 25; creat 6.78; k+ 4.7; na++ 137; ca 9.0 liver normal albumin 4.0  Hgb a1c 6.9; chol 94 trig 439 LDL direct 16.3 05-10-19 hgb a1c 7.6   06-03-19: wbc 6.3; hgb 10.8; hct 33.4; mcv 106.7; plt 132; glucose 160; bun 30; creat 8.46; k+ 4.6; na++ 137; ca 9.1; liver normal albumin 3.6; chol 115; LDL 25.5; trig 531; hdl 24; PSA 2.73  TODAY  09-07-19: hgb a1c 7.5 09-16-19: wbc 8.0; hgb 16.4; hct 50.6; mcv 107.2 plt 141; glucose 165; bun 40; creat 10.47; k+ 3.7; na++ 135; ca 9.3; liver normal albumin 4.6; stool guaiac neg   Review of Systems  Constitutional: Negative for malaise/fatigue.  Respiratory: Negative for cough and shortness of breath.   Cardiovascular: Negative for chest pain, palpitations and leg swelling.  Gastrointestinal: Negative for abdominal pain, constipation and heartburn.  Musculoskeletal: Negative for back pain, joint pain and myalgias.  Skin:  Negative.   Neurological: Negative for dizziness.  Psychiatric/Behavioral: The patient is not nervous/anxious.     Physical Exam Constitutional:      General: He is not in acute distress.    Appearance: He is well-developed. He is not diaphoretic.  Neck:     Thyroid: No thyromegaly.  Cardiovascular:     Rate and Rhythm: Normal rate and regular rhythm.     Pulses: Normal pulses.     Heart sounds: Normal heart sounds.  Pulmonary:     Effort: Pulmonary effort is normal. No respiratory distress.     Breath sounds: Normal breath sounds.  Abdominal:     General: Bowel sounds are normal. There is no distension.     Palpations: Abdomen is soft.     Tenderness: There is no abdominal tenderness.  Musculoskeletal:     Cervical back: Neck supple.     Right lower leg: No edema.     Left lower leg: No edema.     Comments:  Is able to move all extremities History of right femur ORIF   Lymphadenopathy:     Cervical: No cervical adenopathy.  Skin:    General: Skin is warm and dry.     Comments: Right upper extremity a/v fistula + thrill + bruit  Neurological:     Mental Status: He is alert. Mental status is at baseline.  Psychiatric:        Mood and Affect: Mood normal.      ASSESSMENT/ PLAN:  TODAY  1. Long term resident of snf 2. End stage renal disease on hemodialysis due to type 2 diabetes mellitus 3. 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  Will setup for the first injection on 10-25-19.     MD is aware of resident's narcotic use and is in agreement with current plan of care. We will attempt to wean resident as appropriate.  Ok Edwards NP [facility]  Contact [phone] Monday through Friday 8am- 5pm  After hours call [phone]

## 2019-10-23 ENCOUNTER — Other Ambulatory Visit (HOSPITAL_COMMUNITY)
Admission: RE | Admit: 2019-10-23 | Discharge: 2019-10-23 | Disposition: A | Discharge status: Home / Self Care | Payer: Medicare Other | Source: Ambulatory Visit | Attending: Adult Health | Admitting: Adult Health

## 2019-10-23 DIAGNOSIS — Z20828 Contact with and (suspected) exposure to other viral communicable diseases: Secondary | ICD-10-CM | POA: Insufficient documentation

## 2019-10-24 LAB — NOVEL CORONAVIRUS, NAA (HOSP ORDER, SEND-OUT TO REF LAB; TAT 18-24 HRS): SARS-CoV-2, NAA: NOT DETECTED

## 2019-10-27 ENCOUNTER — Encounter: Payer: Self-pay | Admitting: Adult Health

## 2019-10-27 ENCOUNTER — Non-Acute Institutional Stay (SKILLED_NURSING_FACILITY): Payer: Medicare Other | Admitting: Adult Health

## 2019-10-27 DIAGNOSIS — F321 Major depressive disorder, single episode, moderate: Secondary | ICD-10-CM | POA: Diagnosis not present

## 2019-10-27 NOTE — Progress Notes (Signed)
Location:    [place] Room Number: 112/D Place of Service:  SNF (31)   CODE STATUS: Full Code  No Known Allergies  Chief Complaint  Patient presents with  . Acute Visit    Med Management    HPI:  We have come together to discuss his use of zoloft. Since starting this medication his mood state has improved. There are no repots of anxiety; no depressive thought; no changes in appetite.   Past Medical History:  Diagnosis Date  . Abnormal CT scan, kidney 10/06/2011  . Acute pyelonephritis 10/07/2011  . Anemia    normocytic  . Anxiety    mental retardation  . Bladder wall thickening 10/06/2011  . BPH (benign prostatic hypertrophy)   . Diabetes mellitus   . Dialysis patient [facility])    Tuesday, Thursday and Saturday,   . DVT of leg (deep venous thrombosis) ([place]) 12/25/2016  . Edema     history of lower extremity edema  . GERD (gastroesophageal reflux disease)   . Heme positive stool   . Hydronephrosis   . Hyperkalemia   . Hyperlipidemia   . Hypernatremia   . Hypertension   . Hypothyroidism   . Impaired speech   . 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 MR (mental retardation)   . Muscle weakness   . Obstructive uropathy   . 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 Poor historian poor historian  . Protein calorie malnutrition ([place])   . Pyelonephritis   . Renal failure (ARF), acute on chronic (HCC)   . Renal insufficiency    chronic history  . Sepsis ([place])   . Smoking   . Uremia   . Urinary retention   . UTI (lower urinary tract infection) 10/06/2011    Past Surgical History:  Procedure Laterality Date  . A/V FISTULAGRAM N/A 08/13/2018   Procedure: A/V FISTULAGRAM - Right Upper;  Surgeon: Elam Dutch, MD;  Location: [place] CV LAB;  Service: Cardiovascular;  Laterality: N/A;  . A/V FISTULAGRAM N/A 11/22/2018   Procedure: A/V FISTULAGRAM - Right Upper;  Surgeon: Waynetta Sandy, MD;  Location: [place] CV LAB;   Service: Cardiovascular;  Laterality: N/A;  . AV FISTULA PLACEMENT Left 07/06/2015   Procedure:  INSERTION LEFT ARM ARTERIOVENOUS GORTEX GRAFT;  Surgeon: Angelia Mould, MD;  Location: [place];  Service: Vascular;  Laterality: Left;  . AV FISTULA PLACEMENT Right 02/26/2016   Procedure: ARTERIOVENOUS (AV) FISTULA CREATION ;  Surgeon: Angelia Mould, MD;  Location: [place];  Service: Vascular;  Laterality: Right;  . AV FISTULA PLACEMENT Right 11/25/2018   Procedure: INSERTION OF ARTERIOVENOUS (AV) ARTEGRAFT RIGHT UPPER ARM;  Surgeon: Waynetta Sandy, MD;  Location: [place];  Service: Vascular;  Laterality: Right;  . [place] REMOVAL Left 10/09/2015   Procedure: REMOVAL OF ARTERIOVENOUS GORETEX GRAFT ([place]) Evacuation of Lymphocele, Vein Patch angioplasty of brachial artery.;  Surgeon: Angelia Mould, MD;  Location: [place];  Service: Vascular;  Laterality: Left;  . BASCILIC VEIN TRANSPOSITION Right 02/26/2016   Procedure: Right BASCILIC VEIN TRANSPOSITION;  Surgeon: Angelia Mould, MD;  Location: [place];  Service: Vascular;  Laterality: Right;  . CIRCUMCISION N/A 01/04/2014   Procedure: CIRCUMCISION ADULT (procedure #1);  Surgeon: Marissa Nestle, MD;  Location: [facility];  Service: Urology;  Laterality: N/A;  . COLECTOMY N/A 05/04/2017   Procedure: TOTAL COLECTOMY;  Surgeon: Aviva Signs, MD;  Location: [facility];  Service: General;  Laterality: N/A;  .  COLONOSCOPY N/A 04/27/2017   Procedure: COLONOSCOPY;  Surgeon: Daneil Dolin, MD;  [facility];  Service: Endoscopy;  Laterality: N/A;  245  . CYSTOSCOPY W/ RETROGRADES Bilateral 06/29/2015   Procedure: CYSTOSCOPY, DILATION OF URETHRAL STRICTURE WITH BILATERAL RETROGRADE PYELOGRAM,SUPRAPUBIC TUBE CHANGE;  Surgeon: Festus Aloe, MD;  Location: [facility];  Service: Urology;  Laterality: Bilateral;  . CYSTOSCOPY WITH URETHRAL DILATATION N/A 12/29/2013   Procedure: CYSTOSCOPY WITH URETHRAL DILATATION;  Surgeon: Marissa Nestle,  MD;  [facility];  Service: Urology;  Laterality: N/A;  . ESOPHAGOGASTRODUODENOSCOPY N/A 04/27/2017   Procedure: ESOPHAGOGASTRODUODENOSCOPY (EGD);  Surgeon: Daneil Dolin, MD;  [facility];  Service: Endoscopy;  Laterality: N/A;  . INSERTION OF DIALYSIS CATHETER Right 11/25/2018   Procedure: INSERTION OF DIALYSIS CATHETER RIGHT INTERNAL JUGULAR;  Surgeon: Waynetta Sandy, MD;  Location: [place];  Service: Vascular;  Laterality: Right;  . IR AV DIALY SHUNT INTRO [place] W/PTA/IMG RIGHT Right 09/07/2018  . IR REMOVAL TUN CV CATH W/O FL  01/12/2019  . IR THROMBECTOMY AV FISTULA W/THROMBOLYSIS/PTA INC/SHUNT/IMG RIGHT Right 04/26/2018  . IR US GUIDE VASC ACCESS RIGHT  04/26/2018  . IR US GUIDE VASC ACCESS RIGHT  09/07/2018  . ORIF FEMUR FRACTURE Right 11/22/2016   Procedure: OPEN REDUCTION INTERNAL FIXATION (ORIF) DISTAL FEMUR FRACTURE;  Surgeon: Rod Can, MD;  Location: [place];  Service: Orthopedics;  Laterality: Right;  . PATCH ANGIOPLASTY Right 12/10/2017   Procedure: PATCH ANGIOPLASTY;  Surgeon: Angelia Mould, MD;  Location: [facility];  Service: Vascular;  Laterality: Right;  . PERIPHERAL VASCULAR BALLOON ANGIOPLASTY  08/13/2018   Procedure: PERIPHERAL VASCULAR BALLOON ANGIOPLASTY;  Surgeon: Elam Dutch, MD;  Location: [place] CV LAB;  Service: Cardiovascular;;  right AV fistula   . PERIPHERAL VASCULAR BALLOON ANGIOPLASTY  11/22/2018   Procedure: PERIPHERAL VASCULAR BALLOON ANGIOPLASTY;  Surgeon: Waynetta Sandy, MD;  Location: [place] [facility];  Service: Cardiovascular;;  rt AV fistula  . PERIPHERAL VASCULAR CATHETERIZATION N/A 10/08/2015   Procedure: A/V Shuntogram;  Surgeon: Angelia Mould, MD;  Location: [place] [facility];  Service: Cardiovascular;  Laterality: N/A;  . THROMBECTOMY W/ EMBOLECTOMY Right 12/10/2017   Procedure: THROMBECTOMY REVISION RIGHT ARM  ARTERIOVENOUS FISTULA;  Surgeon: Angelia Mould, MD;   Location: [place];  Service: Vascular;  Laterality: Right;  . TRANSURETHRAL RESECTION OF PROSTATE N/A 01/04/2014   Procedure: TRANSURETHRAL RESECTION OF THE PROSTATE (TURP) (procedure #2);  Surgeon: Marissa Nestle, MD;  [facility];  Service: Urology;  Laterality: N/A;    Social History   Socioeconomic History  . Marital status: Single    Spouse name: Not on file  . Number of children: Not on file  . Years of education: Not on file  . Highest education level: Not on file  Occupational History  . Occupation: retired   Tobacco Use  . Smoking status: Never Smoker  . Smokeless tobacco: Never Used  Substance and Sexual Activity  . Alcohol use: No  . Drug use: No  . Sexual activity: Not Currently  Other Topics Concern  . 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   [facility] term resident of [facility]    Social Determinants of Health   Financial Resource Strain:   . Difficulty of Paying Living Expenses: Not on file  Food Insecurity:   . Worried About Charity fundraiser in the Last Year: Not on file  .  Ran Out of Food in the Last Year: Not on file  Transportation Needs:   . Lack of Transportation (Medical): Not on file  . Lack of Transportation (Non-Medical): Not on file  Physical Activity: Unknown  . Days of Exercise per Week: Not on file  . Minutes of Exercise per Session: 0 min  Stress:   . Feeling of Stress : Not on file  Social Connections: Unknown  . Frequency of Communication with Friends and Family: Not on file  . Frequency of Social Gatherings with Friends and Family: Never  . Attends Religious Services: Not on file  . Active Member of Clubs or Organizations: Not on file  . Attends Archivist Meetings: Not on file  . Marital Status: Not on file  Intimate Partner Violence:   . Fear of Current or Ex-Partner: Not on file  . Emotionally Abused: Not on file  . Physically Abused: Not on file   . Sexually Abused: Not on file   Family History  Problem Relation Age of Onset  . Cancer Mother   . Colon cancer Neg Hx       VITAL SIGNS BP (!) 92/57   Pulse 86   Temp 98.4 F (36.9 C) (Oral)   Resp 20   Ht 5\' 8"  (1.727 m)   Wt 210 lb (95.3 kg)   SpO2 100%   BMI 31.93 kg/m   Outpatient Encounter Medications as of 10/27/2019  Medication Sig  . atorvastatin (LIPITOR) 80 MG tablet Take 80 mg by mouth every evening.  . insulin aspart (NOVOLOG FLEXPEN) 100 UNIT/ML FlexPen Inject 13 Units into the skin 3 (three) times daily with meals.   . Insulin Glargine (BASAGLAR KWIKPEN) 100 UNIT/ML SOPN Inject 30 Units into the skin at bedtime.   Marland Kitchen JANUVIA 25 MG tablet Take 25 mg by mouth daily.   Marland Kitchen levothyroxine (SYNTHROID, LEVOTHROID) 88 MCG tablet Take 88 mcg by mouth daily before breakfast.  . midodrine (PROAMATINE) 10 MG tablet Take 10 mg by mouth. Send med with resident on Mon/Wed/Fri to dialysis. do not give [facility] give to resident to take with him. send with 5mg  to equal 15mg .  . midodrine (PROAMATINE) 5 MG tablet Take 5 mg by mouth. Send with resident on dialysis days.  Mon, Wed, Fri.  Take along with 10 mg to equal 15 mg  . NON FORMULARY Fluid Restriction - 1200 cc a day 500 cc for days, Evenings 500 cc, Night 200 cc  . NON FORMULARY Diet Type:  NAS, Cons CHO Diet  . Nutritional Supplements (NEPRO/CARBSTEADY PO) Take 1 Can by mouth 2 (two) times daily.  Marland Kitchen omega-3 acid ethyl esters (LOVAZA) 1 g capsule 1 gram; amt: 2 capsules; oral  Special Instructions: elevated triglycerides At Bedtime 09:00 PM  . omeprazole (PRILOSEC) 40 MG capsule Take 40 mg by mouth at bedtime.   . polyethylene glycol (MIRALAX / GLYCOLAX) packet Take 17 g by mouth as needed for mild constipation.   . sertraline (ZOLOFT) 50 MG tablet Take 50 mg by mouth daily.  . sevelamer carbonate (RENVELA) 800 MG tablet Take 1,600 mg by mouth 3 (three) times daily.   . sevelamer carbonate (RENVELA) 800 MG tablet  Take 800 mg by mouth daily. With snacks  . tamsulosin (FLOMAX) 0.4 MG CAPS capsule Take 0.4 mg by mouth every evening. Give 30 minutes after a meal  . torsemide (DEMADEX) 10 MG tablet Take 5 tablets (50 mg total) by mouth daily.  . [DISCONTINUED] Cholecalciferol (VITAMIN  D3) 1.25 MG (50000 UT) CAPS Take 1 capsule by mouth once a week.   . [DISCONTINUED] Multiple Vitamin (MULTIVITAMIN WITH MINERALS) TABS tablet Take 1 tablet by mouth every evening.    No facility-administered encounter medications on file as of 10/27/2019.     SIGNIFICANT DIAGNOSTIC EXAMS    LABS REVIEWED PREVIOUS;  12-06-18: tsh 2.752 01-25-19: wbc 5.6; hgb 12.9 hct 40.8; mcv 110.3; plt 146; glucose 334; bun 25; creat 6.78; k+ 4.7; na++ 137; ca 9.0 liver normal albumin 4.0  Hgb a1c 6.9; chol 94 trig 439 LDL direct 16.3 05-10-19 hgb a1c 7.6   06-03-19: wbc 6.3; hgb 10.8; hct 33.4; mcv 106.7; plt 132; glucose 160; bun 30; creat 8.46; k+ 4.6; na++ 137; ca 9.1; liver normal albumin 3.6; chol 115; LDL 25.5; trig 531; hdl 24; PSA 2.73 09-07-19: hgb a1c 7.5 09-16-19: wbc 8.0; hgb 16.4; hct 50.6; mcv 107.2 plt 141; glucose 165; bun 40; creat 10.47; k+ 3.7; na++ 135; ca 9.3; liver normal albumin 4.6; stool guaiac neg   NO NEW LABS.   Review of Systems  Constitutional: Negative for malaise/fatigue.  Respiratory: Negative for cough and shortness of breath.   Cardiovascular: Negative for chest pain, palpitations and leg swelling.  Gastrointestinal: Negative for abdominal pain, constipation and heartburn.  Musculoskeletal: Negative for back pain, joint pain and myalgias.  Skin: Negative.   Neurological: Negative for dizziness.  Psychiatric/Behavioral: The patient is not nervous/anxious.     Physical Exam Constitutional:      General: He is not in acute distress.    Appearance: He is well-developed. He is not diaphoretic.  Neck:     Thyroid: No thyromegaly.  Cardiovascular:     Rate and Rhythm: Normal rate and regular rhythm.      Pulses: Normal pulses.     Heart sounds: Normal heart sounds.  Pulmonary:     Effort: Pulmonary effort is normal. No respiratory distress.     Breath sounds: Normal breath sounds.  Abdominal:     General: Bowel sounds are normal. There is no distension.     Palpations: Abdomen is soft.     Tenderness: There is no abdominal tenderness.  Musculoskeletal:     Cervical back: Neck supple.     Right lower leg: No edema.     Left lower leg: No edema.     Comments: Is able to move all extremities History of right femur ORIF    Lymphadenopathy:     Cervical: No cervical adenopathy.  Skin:    General: Skin is warm and dry.     Comments: Right upper extremity a/v fistula + thrill + bruit   Neurological:     Mental Status: He is alert. Mental status is at baseline.  Psychiatric:        Mood and Affect: Mood normal.        ASSESSMENT/ PLAN:  TODAY;   1. Depression major single episode moderate: is stable will continue zoloft 50 mg daily lowering his mediation at this time could cause him emotional harm.    MD is aware of resident's narcotic use and is in agreement with current plan of care. We will attempt to wean resident as appropriate.  Ok Edwards NP [facility]  Contact [phone] Monday through Friday 8am- 5pm  After hours call [phone]

## 2019-10-28 DIAGNOSIS — D631 Anemia in chronic kidney disease: Secondary | ICD-10-CM | POA: Diagnosis not present

## 2019-10-28 DIAGNOSIS — N186 End stage renal disease: Secondary | ICD-10-CM | POA: Diagnosis not present

## 2019-10-28 DIAGNOSIS — Z992 Dependence on renal dialysis: Secondary | ICD-10-CM | POA: Diagnosis not present

## 2019-10-28 DIAGNOSIS — N2581 Secondary hyperparathyroidism of renal origin: Secondary | ICD-10-CM | POA: Diagnosis not present

## 2019-10-28 DIAGNOSIS — D509 Iron deficiency anemia, unspecified: Secondary | ICD-10-CM | POA: Diagnosis not present

## 2019-10-31 DIAGNOSIS — D509 Iron deficiency anemia, unspecified: Secondary | ICD-10-CM | POA: Diagnosis not present

## 2019-10-31 DIAGNOSIS — N186 End stage renal disease: Secondary | ICD-10-CM | POA: Diagnosis not present

## 2019-10-31 DIAGNOSIS — N2581 Secondary hyperparathyroidism of renal origin: Secondary | ICD-10-CM | POA: Diagnosis not present

## 2019-10-31 DIAGNOSIS — D631 Anemia in chronic kidney disease: Secondary | ICD-10-CM | POA: Diagnosis not present

## 2019-10-31 DIAGNOSIS — Z992 Dependence on renal dialysis: Secondary | ICD-10-CM | POA: Diagnosis not present

## 2019-11-01 ENCOUNTER — Non-Acute Institutional Stay (SKILLED_NURSING_FACILITY): Payer: Medicare Other | Admitting: Adult Health

## 2019-11-01 ENCOUNTER — Encounter: Payer: Self-pay | Admitting: Adult Health

## 2019-11-01 DIAGNOSIS — N186 End stage renal disease: Secondary | ICD-10-CM

## 2019-11-01 DIAGNOSIS — E1122 Type 2 diabetes mellitus with diabetic chronic kidney disease: Secondary | ICD-10-CM

## 2019-11-01 DIAGNOSIS — I120 Hypertensive chronic kidney disease with stage 5 chronic kidney disease or end stage renal disease: Secondary | ICD-10-CM

## 2019-11-01 DIAGNOSIS — E785 Hyperlipidemia, unspecified: Secondary | ICD-10-CM

## 2019-11-01 DIAGNOSIS — Z992 Dependence on renal dialysis: Secondary | ICD-10-CM

## 2019-11-01 DIAGNOSIS — E1169 Type 2 diabetes mellitus with other specified complication: Secondary | ICD-10-CM | POA: Diagnosis not present

## 2019-11-01 NOTE — Progress Notes (Signed)
Location:    [facility] Room Number: 112/D Place of Service:  SNF (31)   CODE STATUS: Full Code  No Known Allergies  Chief Complaint  Patient presents with  . 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 There are no reports of uncontrolled pain; no changes in appetite; no anxiety or depressive thoughts.    Past Medical History:  Diagnosis Date  . Abnormal CT scan, kidney 10/06/2011  . Acute pyelonephritis 10/07/2011  . Anemia    normocytic  . Anxiety    mental retardation  . Bladder wall thickening 10/06/2011  . BPH (benign prostatic hypertrophy)   . Diabetes mellitus   . Dialysis patient [facility])    Tuesday, Thursday and Saturday,   . DVT of leg (deep venous thrombosis) ([place]) 12/25/2016  . Edema     history of lower extremity edema  . GERD (gastroesophageal reflux disease)   . Heme positive stool   . Hydronephrosis   . Hyperkalemia   . Hyperlipidemia   . Hypernatremia   . Hypertension   . Hypothyroidism   . Impaired speech   . 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 MR (mental retardation)   . Muscle weakness   . Obstructive uropathy   . 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 Poor historian poor historian  . Protein calorie malnutrition ([place])   . Pyelonephritis   . Renal failure (ARF), acute on chronic (HCC)   . Renal insufficiency    chronic history  . Sepsis ([place])   . Smoking   . Uremia   . Urinary retention   . UTI (lower urinary tract infection) 10/06/2011    Past Surgical History:  Procedure Laterality Date  . A/V FISTULAGRAM N/A 08/13/2018   Procedure: A/V FISTULAGRAM - Right Upper;   Surgeon: Elam Dutch, MD;  Location: [place] CV LAB;  Service: Cardiovascular;  Laterality: N/A;  . A/V FISTULAGRAM N/A 11/22/2018   Procedure: A/V FISTULAGRAM - Right Upper;  Surgeon: Waynetta Sandy, MD;  Location: [place] CV LAB;  Service: Cardiovascular;  Laterality: N/A;  . AV FISTULA PLACEMENT Left 07/06/2015   Procedure:  INSERTION LEFT ARM ARTERIOVENOUS GORTEX GRAFT;  Surgeon: Angelia Mould, MD;  Location: [place];  Service: Vascular;  Laterality: Left;  . AV FISTULA PLACEMENT Right 02/26/2016   Procedure: ARTERIOVENOUS (AV) FISTULA CREATION ;  Surgeon: Angelia Mould, MD;  Location: [place];  Service: Vascular;  Laterality: Right;  . AV FISTULA PLACEMENT Right 11/25/2018   Procedure: INSERTION OF ARTERIOVENOUS (AV) ARTEGRAFT RIGHT UPPER ARM;  Surgeon: Waynetta Sandy, MD;  Location: [place];  Service: Vascular;  Laterality: Right;  . [place] REMOVAL Left 10/09/2015   Procedure: REMOVAL OF ARTERIOVENOUS GORETEX GRAFT ([place]) Evacuation of Lymphocele, Vein Patch angioplasty of brachial artery.;  Surgeon: Angelia Mould, MD;  Location: [place];  Service: Vascular;  Laterality: Left;  . BASCILIC VEIN TRANSPOSITION Right 02/26/2016   Procedure: Right BASCILIC VEIN TRANSPOSITION;  Surgeon: Angelia Mould, MD;  Location: [place];  Service: Vascular;  Laterality: Right;  . CIRCUMCISION N/A 01/04/2014   Procedure:  CIRCUMCISION ADULT (procedure #1);  Surgeon: Marissa Nestle, MD;  Location: [facility];  Service: Urology;  Laterality: N/A;  . COLECTOMY N/A 05/04/2017   Procedure: TOTAL COLECTOMY;  Surgeon: Aviva Signs, MD;  Location[facility];  Service: General;  Laterality: N/A;  . COLONOSCOPY N/A 04/27/2017   Procedure: COLONOSCOPY;  Surgeon: Daneil Dolin, MD;  [facility];  Service: Endoscopy;  Laterality: N/A;  245  . CYSTOSCOPY W/ RETROGRADES Bilateral 06/29/2015   Procedure: CYSTOSCOPY, DILATION OF URETHRAL STRICTURE WITH BILATERAL RETROGRADE  PYELOGRAM,SUPRAPUBIC TUBE CHANGE;  Surgeon: Festus Aloe, MD;  Location: [facility];  Service: Urology;  Laterality: Bilateral;  . CYSTOSCOPY WITH URETHRAL DILATATION N/A 12/29/2013   Procedure: CYSTOSCOPY WITH URETHRAL DILATATION;  Surgeon: Marissa Nestle, MD;  [facility];  Service: Urology;  Laterality: N/A;  . ESOPHAGOGASTRODUODENOSCOPY N/A 04/27/2017   Procedure: ESOPHAGOGASTRODUODENOSCOPY (EGD);  Surgeon: Daneil Dolin, MD;  [facility];  Service: Endoscopy;  Laterality: N/A;  . INSERTION OF DIALYSIS CATHETER Right 11/25/2018   Procedure: INSERTION OF DIALYSIS CATHETER RIGHT INTERNAL JUGULAR;  Surgeon: Waynetta Sandy, MD;  Location: [place];  Service: Vascular;  Laterality: Right;  . IR AV DIALY SHUNT INTRO [place] W/PTA/IMG RIGHT Right 09/07/2018  . IR REMOVAL TUN CV CATH W/O FL  01/12/2019  . IR THROMBECTOMY AV FISTULA W/THROMBOLYSIS/PTA INC/SHUNT/IMG RIGHT Right 04/26/2018  . IR US GUIDE VASC ACCESS RIGHT  04/26/2018  . IR US GUIDE VASC ACCESS RIGHT  09/07/2018  . ORIF FEMUR FRACTURE Right 11/22/2016   Procedure: OPEN REDUCTION INTERNAL FIXATION (ORIF) DISTAL FEMUR FRACTURE;  Surgeon: Rod Can, MD;  Location: [place];  Service: Orthopedics;  Laterality: Right;  . PATCH ANGIOPLASTY Right 12/10/2017   Procedure: PATCH ANGIOPLASTY;  Surgeon: Angelia Mould, MD;  Location: [facility] OR;  Service: Vascular;  Laterality: Right;  . PERIPHERAL VASCULAR BALLOON ANGIOPLASTY  08/13/2018   Procedure: PERIPHERAL VASCULAR BALLOON ANGIOPLASTY;  Surgeon: Elam Dutch, MD;  Location: [place] CV LAB;  Service: Cardiovascular;;  right AV fistula   . PERIPHERAL VASCULAR BALLOON ANGIOPLASTY  11/22/2018   Procedure: PERIPHERAL VASCULAR BALLOON ANGIOPLASTY;  Surgeon: Waynetta Sandy, MD;  Location: [place] CV LAB;  Service: Cardiovascular;;  rt AV fistula  . PERIPHERAL VASCULAR CATHETERIZATION N/A 10/08/2015   Procedure: A/V Shuntogram;  Surgeon:  Angelia Mould, MD;  Location: [place] CV LAB;  Service: Cardiovascular;  Laterality: N/A;  . THROMBECTOMY W/ EMBOLECTOMY Right 12/10/2017   Procedure: THROMBECTOMY REVISION RIGHT ARM  ARTERIOVENOUS FISTULA;  Surgeon: Angelia Mould, MD;  Location: [place];  Service: Vascular;  Laterality: Right;  . TRANSURETHRAL RESECTION OF PROSTATE N/A 01/04/2014   Procedure: TRANSURETHRAL RESECTION OF THE PROSTATE (TURP) (procedure #2);  Surgeon: Marissa Nestle, MD;  [facility];  Service: Urology;  Laterality: N/A;    Social History   Socioeconomic History  . Marital status: Single    Spouse name: Not on file  . Number of children: Not on file  . Years of education: Not on file  . Highest education level: Not on file  Occupational History  . Occupation: retired   Tobacco Use  . Smoking status: Never Smoker  . Smokeless tobacco: Never Used  Substance and Sexual Activity  . Alcohol use: No  . Drug use: No  . Sexual activity: Not Currently  Other Topics Concern  . 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  Long term resident of [facility]    Social Determinants of Health   Financial Resource Strain:   . Difficulty of Paying Living Expenses: Not on file  Food Insecurity:   . Worried About Charity fundraiser in the Last Year: Not on file  . Ran Out of Food in the Last Year: Not on file  Transportation Needs:   . Lack of Transportation (Medical): Not on file  . Lack of Transportation (Non-Medical): Not on file  Physical Activity: Unknown  . Days of Exercise per Week: Not on file  . Minutes of Exercise per Session: 0 min  Stress:   . Feeling of Stress : Not on file  Social Connections: Unknown  . Frequency of Communication with Friends and Family: Not on file  . Frequency of Social Gatherings with Friends and Family: Never  . Attends Religious Services: Not on file  . Active Member of Clubs or  Organizations: Not on file  . Attends Archivist Meetings: Not on file  . Marital Status: Not on file  Intimate Partner Violence:   . Fear of Current or Ex-Partner: Not on file  . Emotionally Abused: Not on file  . Physically Abused: Not on file  . Sexually Abused: Not on file   Family History  Problem Relation Age of Onset  . Cancer Mother   . Colon cancer Neg Hx       VITAL SIGNS BP 102/61   Pulse 64   Temp 98.1 F (36.7 C) (Oral)   Resp 17   Ht 5\' 8"  (1.727 m)   Wt 211 lb 6.4 oz (95.9 kg)   SpO2 100%   BMI 32.14 kg/m   Outpatient Encounter Medications as of 11/01/2019  Medication Sig  . atorvastatin (LIPITOR) 80 MG tablet Take 80 mg by mouth every evening.  . insulin aspart (NOVOLOG FLEXPEN) 100 UNIT/ML FlexPen Inject 13 Units into the skin 3 (three) times daily with meals.   . Insulin Glargine (BASAGLAR KWIKPEN) 100 UNIT/ML SOPN Inject 30 Units into the skin at bedtime.   Marland Kitchen JANUVIA 25 MG tablet Take 25 mg by mouth daily.   Marland Kitchen levothyroxine (SYNTHROID, LEVOTHROID) 88 MCG tablet Take 88 mcg by mouth daily before breakfast.  . midodrine (PROAMATINE) 10 MG tablet Take 10 mg by mouth. Send med with resident on Mon/Wed/Fri to dialysis. do not give [facility] give to resident to take with him. send with 5mg  to equal 15mg .  . midodrine (PROAMATINE) 5 MG tablet Take 5 mg by mouth. Send with resident on dialysis days.  Mon, Wed, Fri.  Take along with 10 mg to equal 15 mg  . NON FORMULARY Fluid Restriction - 1200 cc a day 500 cc for days, Evenings 500 cc, Night 200 cc  . NON FORMULARY Diet Type:  NAS, Cons CHO Diet  . Nutritional Supplements (NEPRO/CARBSTEADY PO) Take 1 Can by mouth 2 (two) times daily.  Marland Kitchen omega-3 acid ethyl esters (LOVAZA) 1 g capsule 1 gram; amt: 2 capsules; oral  Special Instructions: elevated triglycerides At Bedtime 09:00 PM  . omeprazole (PRILOSEC) 40 MG capsule Take 40 mg by mouth at bedtime.   . polyethylene glycol (MIRALAX / GLYCOLAX)  packet Take 17 g by mouth as needed for mild constipation.   . sertraline (ZOLOFT) 50 MG tablet Take 50 mg by mouth daily.  . sevelamer carbonate (RENVELA) 800 MG tablet Take 1,600 mg by mouth 3 (three) times daily.   . sevelamer carbonate (RENVELA) 800 MG tablet  Take 800 mg by mouth daily. With snacks  . tamsulosin (FLOMAX) 0.4 MG CAPS capsule Take 0.4 mg by mouth every evening. Give 30 minutes after a meal  . torsemide (DEMADEX) 10 MG tablet Take 5 tablets (50 mg total) by mouth daily.   No facility-administered encounter medications on file as of 11/01/2019.     SIGNIFICANT DIAGNOSTIC EXAMS   LABS REVIEWED PREVIOUS;  12-06-18: tsh 2.752 01-25-19: wbc 5.6; hgb 12.9 hct 40.8; mcv 110.3; plt 146; glucose 334; bun 25; creat 6.78; k+ 4.7; na++ 137; ca 9.0 liver normal albumin 4.0  Hgb a1c 6.9; chol 94 trig 439 LDL direct 16.3 05-10-19 hgb a1c 7.6   06-03-19: wbc 6.3; hgb 10.8; hct 33.4; mcv 106.7; plt 132; glucose 160; bun 30; creat 8.46; k+ 4.6; na++ 137; ca 9.1; liver normal albumin 3.6; chol 115; LDL 25.5; trig 531; hdl 24; PSA 2.73 09-07-19: hgb a1c 7.5 09-16-19: wbc 8.0; hgb 16.4; hct 50.6; mcv 107.2 plt 141; glucose 165; bun 40; creat 10.47; k+ 3.7; na++ 135; ca 9.3; liver normal albumin 4.6; stool guaiac neg   NO NEW LABS.   Review of Systems  Constitutional: Negative for malaise/fatigue.  Respiratory: Negative for cough and shortness of breath.   Cardiovascular: Negative for chest pain, palpitations and leg swelling.  Gastrointestinal: Negative for abdominal pain, constipation and heartburn.  Musculoskeletal: Negative for back pain, joint pain and myalgias.  Skin: Negative.   Neurological: Negative for dizziness.  Psychiatric/Behavioral: The patient is not nervous/anxious.     Physical Exam Constitutional:      General: He is not in acute distress.    Appearance: He is well-developed. He is not diaphoretic.  Neck:     Thyroid: No thyromegaly.  Cardiovascular:     Rate and  Rhythm: Normal rate and regular rhythm.     Pulses: Normal pulses.     Heart sounds: Normal heart sounds.  Pulmonary:     Effort: Pulmonary effort is normal. No respiratory distress.     Breath sounds: Normal breath sounds.  Abdominal:     General: Bowel sounds are normal. There is no distension.     Palpations: Abdomen is soft.     Tenderness: There is no abdominal tenderness.  Musculoskeletal:     Cervical back: Neck supple.     Right lower leg: No edema.     Left lower leg: No edema.     Comments:  Is able to move all extremities History of right femur ORIF     Lymphadenopathy:     Cervical: No cervical adenopathy.  Skin:    General: Skin is warm and dry.     Comments: Right upper extremity a/v fistula + thrill + bruit    Neurological:     Mental Status: He is alert. Mental status is at baseline.  Psychiatric:        Mood and Affect: Mood normal.        ASSESSMENT/ PLAN:  TODAY  1. Type  2 diabetes mellitus with hypertension and  end stage renal disease: is stable hgb a1c 7.5; will continue januvia 25 mg daily novolog 13 units with meals and basaglar 30 units nightly   2. Dyslipidemia associated with type 2 diabetes mellitus is stable trig 439; will continue lipitor 80 mg daily and lovaza 2 gm daily   3. End stage renal disease on hemodialysis due to type 2 diabetes mellitus: is stable will continue dialysis 3 days weekly followed by nephrology; will continue midodrine 15  mg with dialysis treatments 1200 cc fluid restriction; renvela 1600 mg with meals and 800 mg with snackes  PREVIOUS   4. Other specified hypothyroidism: is stable tsh 2.752; will continue synthroid 88 mcg daily  5. Anemia of chronic disease on chronic dialysis is stable hgb 10.8 will monitor   6. Major depression single episode moderate: is stable will continue zoloft 50 mg daily and will monitor his status.   7. Bilateral lower extremity edema: stable will continue demadex 50 mg daily   8.  Orthostatic hypotension: is stable 102/61  will continue midodrine 15 mg with dialysis   9. GERD without esophagitis: is stable will continue prilosec 40 mg daily      MD is aware of resident's narcotic use and is in agreement with current plan of care. We will attempt to wean resident as appropriate.  Ok Edwards NP [facility]  Contact [phone] Monday through Friday 8am- 5pm  After hours call [phone]

## 2019-11-02 DIAGNOSIS — E1129 Type 2 diabetes mellitus with other diabetic kidney complication: Secondary | ICD-10-CM | POA: Diagnosis not present

## 2019-11-02 DIAGNOSIS — N2581 Secondary hyperparathyroidism of renal origin: Secondary | ICD-10-CM | POA: Diagnosis not present

## 2019-11-02 DIAGNOSIS — Z1159 Encounter for screening for other viral diseases: Secondary | ICD-10-CM | POA: Diagnosis not present

## 2019-11-02 DIAGNOSIS — Z992 Dependence on renal dialysis: Secondary | ICD-10-CM | POA: Diagnosis not present

## 2019-11-02 DIAGNOSIS — I120 Hypertensive chronic kidney disease with stage 5 chronic kidney disease or end stage renal disease: Secondary | ICD-10-CM | POA: Diagnosis not present

## 2019-11-02 DIAGNOSIS — D509 Iron deficiency anemia, unspecified: Secondary | ICD-10-CM | POA: Diagnosis not present

## 2019-11-02 DIAGNOSIS — Z23 Encounter for immunization: Secondary | ICD-10-CM | POA: Diagnosis not present

## 2019-11-02 DIAGNOSIS — N186 End stage renal disease: Secondary | ICD-10-CM | POA: Diagnosis not present

## 2019-11-02 DIAGNOSIS — D631 Anemia in chronic kidney disease: Secondary | ICD-10-CM | POA: Diagnosis not present

## 2019-11-04 DIAGNOSIS — D509 Iron deficiency anemia, unspecified: Secondary | ICD-10-CM | POA: Diagnosis not present

## 2019-11-04 DIAGNOSIS — N2581 Secondary hyperparathyroidism of renal origin: Secondary | ICD-10-CM | POA: Diagnosis not present

## 2019-11-04 DIAGNOSIS — D631 Anemia in chronic kidney disease: Secondary | ICD-10-CM | POA: Diagnosis not present

## 2019-11-04 DIAGNOSIS — Z992 Dependence on renal dialysis: Secondary | ICD-10-CM | POA: Diagnosis not present

## 2019-11-04 DIAGNOSIS — N186 End stage renal disease: Secondary | ICD-10-CM | POA: Diagnosis not present

## 2019-11-07 DIAGNOSIS — D631 Anemia in chronic kidney disease: Secondary | ICD-10-CM | POA: Diagnosis not present

## 2019-11-07 DIAGNOSIS — N2581 Secondary hyperparathyroidism of renal origin: Secondary | ICD-10-CM | POA: Diagnosis not present

## 2019-11-07 DIAGNOSIS — Z992 Dependence on renal dialysis: Secondary | ICD-10-CM | POA: Diagnosis not present

## 2019-11-07 DIAGNOSIS — N186 End stage renal disease: Secondary | ICD-10-CM | POA: Diagnosis not present

## 2019-11-07 DIAGNOSIS — E119 Type 2 diabetes mellitus without complications: Secondary | ICD-10-CM | POA: Diagnosis not present

## 2019-11-07 DIAGNOSIS — D509 Iron deficiency anemia, unspecified: Secondary | ICD-10-CM | POA: Diagnosis not present

## 2019-11-09 DIAGNOSIS — D631 Anemia in chronic kidney disease: Secondary | ICD-10-CM | POA: Diagnosis not present

## 2019-11-09 DIAGNOSIS — D509 Iron deficiency anemia, unspecified: Secondary | ICD-10-CM | POA: Diagnosis not present

## 2019-11-09 DIAGNOSIS — N2581 Secondary hyperparathyroidism of renal origin: Secondary | ICD-10-CM | POA: Diagnosis not present

## 2019-11-09 DIAGNOSIS — Z992 Dependence on renal dialysis: Secondary | ICD-10-CM | POA: Diagnosis not present

## 2019-11-09 DIAGNOSIS — N186 End stage renal disease: Secondary | ICD-10-CM | POA: Diagnosis not present

## 2019-11-10 DIAGNOSIS — I120 Hypertensive chronic kidney disease with stage 5 chronic kidney disease or end stage renal disease: Secondary | ICD-10-CM | POA: Diagnosis not present

## 2019-11-10 DIAGNOSIS — Z1159 Encounter for screening for other viral diseases: Secondary | ICD-10-CM | POA: Diagnosis not present

## 2019-11-10 DIAGNOSIS — E1129 Type 2 diabetes mellitus with other diabetic kidney complication: Secondary | ICD-10-CM | POA: Diagnosis not present

## 2019-11-11 DIAGNOSIS — N186 End stage renal disease: Secondary | ICD-10-CM | POA: Diagnosis not present

## 2019-11-11 DIAGNOSIS — N2581 Secondary hyperparathyroidism of renal origin: Secondary | ICD-10-CM | POA: Diagnosis not present

## 2019-11-11 DIAGNOSIS — Z992 Dependence on renal dialysis: Secondary | ICD-10-CM | POA: Diagnosis not present

## 2019-11-11 DIAGNOSIS — D631 Anemia in chronic kidney disease: Secondary | ICD-10-CM | POA: Diagnosis not present

## 2019-11-11 DIAGNOSIS — D509 Iron deficiency anemia, unspecified: Secondary | ICD-10-CM | POA: Diagnosis not present

## 2019-11-14 DIAGNOSIS — N186 End stage renal disease: Secondary | ICD-10-CM | POA: Diagnosis not present

## 2019-11-14 DIAGNOSIS — D631 Anemia in chronic kidney disease: Secondary | ICD-10-CM | POA: Diagnosis not present

## 2019-11-14 DIAGNOSIS — N2581 Secondary hyperparathyroidism of renal origin: Secondary | ICD-10-CM | POA: Diagnosis not present

## 2019-11-14 DIAGNOSIS — D509 Iron deficiency anemia, unspecified: Secondary | ICD-10-CM | POA: Diagnosis not present

## 2019-11-14 DIAGNOSIS — Z992 Dependence on renal dialysis: Secondary | ICD-10-CM | POA: Diagnosis not present

## 2019-11-16 DIAGNOSIS — N2581 Secondary hyperparathyroidism of renal origin: Secondary | ICD-10-CM | POA: Diagnosis not present

## 2019-11-16 DIAGNOSIS — N186 End stage renal disease: Secondary | ICD-10-CM | POA: Diagnosis not present

## 2019-11-16 DIAGNOSIS — D631 Anemia in chronic kidney disease: Secondary | ICD-10-CM | POA: Diagnosis not present

## 2019-11-16 DIAGNOSIS — D509 Iron deficiency anemia, unspecified: Secondary | ICD-10-CM | POA: Diagnosis not present

## 2019-11-16 DIAGNOSIS — Z992 Dependence on renal dialysis: Secondary | ICD-10-CM | POA: Diagnosis not present

## 2019-11-17 DIAGNOSIS — E1129 Type 2 diabetes mellitus with other diabetic kidney complication: Secondary | ICD-10-CM | POA: Diagnosis not present

## 2019-11-17 DIAGNOSIS — I120 Hypertensive chronic kidney disease with stage 5 chronic kidney disease or end stage renal disease: Secondary | ICD-10-CM | POA: Diagnosis not present

## 2019-11-17 DIAGNOSIS — Z1159 Encounter for screening for other viral diseases: Secondary | ICD-10-CM | POA: Diagnosis not present

## 2019-11-18 DIAGNOSIS — D631 Anemia in chronic kidney disease: Secondary | ICD-10-CM | POA: Diagnosis not present

## 2019-11-18 DIAGNOSIS — N2581 Secondary hyperparathyroidism of renal origin: Secondary | ICD-10-CM | POA: Diagnosis not present

## 2019-11-18 DIAGNOSIS — Z992 Dependence on renal dialysis: Secondary | ICD-10-CM | POA: Diagnosis not present

## 2019-11-18 DIAGNOSIS — N186 End stage renal disease: Secondary | ICD-10-CM | POA: Diagnosis not present

## 2019-11-18 DIAGNOSIS — D509 Iron deficiency anemia, unspecified: Secondary | ICD-10-CM | POA: Diagnosis not present

## 2019-11-21 ENCOUNTER — Non-Acute Institutional Stay (SKILLED_NURSING_FACILITY): Payer: Medicare Other | Admitting: Internal Medicine

## 2019-11-21 ENCOUNTER — Encounter: Payer: Self-pay | Admitting: Internal Medicine

## 2019-11-21 DIAGNOSIS — E11319 Type 2 diabetes mellitus with unspecified diabetic retinopathy without macular edema: Secondary | ICD-10-CM

## 2019-11-21 DIAGNOSIS — D509 Iron deficiency anemia, unspecified: Secondary | ICD-10-CM | POA: Diagnosis not present

## 2019-11-21 DIAGNOSIS — Z992 Dependence on renal dialysis: Secondary | ICD-10-CM | POA: Diagnosis not present

## 2019-11-21 DIAGNOSIS — Z794 Long term (current) use of insulin: Secondary | ICD-10-CM | POA: Diagnosis not present

## 2019-11-21 DIAGNOSIS — D631 Anemia in chronic kidney disease: Secondary | ICD-10-CM | POA: Diagnosis not present

## 2019-11-21 DIAGNOSIS — N186 End stage renal disease: Secondary | ICD-10-CM | POA: Diagnosis not present

## 2019-11-21 DIAGNOSIS — N2581 Secondary hyperparathyroidism of renal origin: Secondary | ICD-10-CM | POA: Diagnosis not present

## 2019-11-21 NOTE — Progress Notes (Signed)
Location:    [facility] Room Number: 112/D Place of Service:  SNF [phone]) Provider:  Henreitta Leber, MD  Patient Care Team: Hennie Duos, MD as PCP - General (Internal Medicine) Cassandria Anger, MD as Consulting Physician (Endocrinology) Fran Lowes, MD (Inactive) as Consulting Physician (Nephrology) Gala Romney Cristopher Estimable, MD as Consulting Physician (Gastroenterology) [facility] ([place]) Nyoka Cowden Phylis Bougie, NP as Nurse Practitioner (Geriatric Medicine)  Extended Emergency Contact Information Primary Emergency Contact: Betsey Amen Address: DSS  [place] of [place] Phone: [phone] Work Phone: [phone] Relation: Legal Guardian Secondary Emergency Contact: Wolfman,Richard Address: [address]          [place], [place] [address] [place] Phone: [phone] Relation: Brother  Code Status:  Full Code Goals of care: Advanced Directive information Advanced Directives 11/21/2019  Does Patient Have a Medical Advance Directive? Yes  Type of Advance Directive (No Data)  Does patient want to make changes to medical advance directive? No - Patient declined  Copy of [place] in Chart? -  Would patient like information on creating a medical advance directive? -  Pre-existing out of facility DNR order (yellow form or pink MOST form) -     Chief Complaint  Patient presents with  . Acute Visit    Diabetic Management    HPI:  Pt is a 74 y.o. male seen today for an acute visit for diabetic management.  Patient has a history of type 2 diabetes and continues on Basaglar insulin 30 units nightly as well as Januvia 25 mg a day and NovoLog 8 units with meals.  His nurse is concerned thinking that blood sugars tends to drop more on dialysis days and would like parameters on giving the NovoLog-it appears blood sugars later in the day at times are in the 60s-she  has used her discretion on holding the NovoLog on dialysis days.  His sugars do have variability ranging from the 60s to higher 100s I do not really see many readings at all above 200.  Hemoglobin A1c in November was 7.5   Past Medical History:  Diagnosis Date  . Abnormal CT scan, kidney 10/06/2011  . Acute pyelonephritis 10/07/2011  . Anemia    normocytic  . Anxiety    mental retardation  . Bladder wall thickening 10/06/2011  . BPH (benign prostatic hypertrophy)   . Diabetes mellitus   . Dialysis patient [facility])    Tuesday, Thursday and Saturday,   . DVT of leg (deep venous thrombosis) ([place]) 12/25/2016  . Edema     history of lower extremity edema  . GERD (gastroesophageal reflux disease)   . Heme positive stool   . Hydronephrosis   . Hyperkalemia   . Hyperlipidemia   . Hypernatremia   . Hypertension   . Hypothyroidism   . Impaired speech   . 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 MR (mental retardation)   . Muscle weakness   . Obstructive uropathy   . 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 Poor historian poor historian  . Protein calorie malnutrition ([place])   . Pyelonephritis   . Renal failure (ARF), acute on chronic (HCC)   . Renal insufficiency    chronic history  . Sepsis ([place])   . Smoking   . Uremia   . Urinary retention   . UTI (lower urinary tract infection) 10/06/2011   Past Surgical History:  Procedure Laterality Date  . A/V FISTULAGRAM  N/A 08/13/2018   Procedure: A/V FISTULAGRAM - Right Upper;  Surgeon: Elam Dutch, MD;  Location: [facility];  Service: Cardiovascular;  Laterality: N/A;  . A/V FISTULAGRAM N/A 11/22/2018   Procedure: A/V FISTULAGRAM - Right Upper;  Surgeon: Waynetta Sandy, MD;  Location: [facility];  Service: Cardiovascular;  Laterality: N/A;  . AV FISTULA PLACEMENT Left 07/06/2015   Procedure:  INSERTION LEFT ARM ARTERIOVENOUS GORTEX GRAFT;  Surgeon: Angelia Mould, MD;  Location: [facility];  Service:  Vascular;  Laterality: Left;  . AV FISTULA PLACEMENT Right 02/26/2016   Procedure: ARTERIOVENOUS (AV) FISTULA CREATION ;  Surgeon: Angelia Mould, MD;  Location: [facility];  Service: Vascular;  Laterality: Right;  . AV FISTULA PLACEMENT Right 11/25/2018   Procedure: INSERTION OF ARTERIOVENOUS (AV) ARTEGRAFT RIGHT UPPER ARM;  Surgeon: Waynetta Sandy, MD;  Location: [facility];  Service: Vascular;  Laterality: Right;  . [place] REMOVAL Left 10/09/2015   Procedure: REMOVAL OF ARTERIOVENOUS GORETEX GRAFT ([place]) Evacuation of Lymphocele, Vein Patch angioplasty of brachial artery.;  Surgeon: Angelia Mould, MD;  Location: [facility];  Service: Vascular;  Laterality: Left;  . BASCILIC VEIN TRANSPOSITION Right 02/26/2016   Procedure: Right BASCILIC VEIN TRANSPOSITION;  Surgeon: Angelia Mould, MD;  Location: [facility];  Service: Vascular;  Laterality: Right;  . CIRCUMCISION N/A 01/04/2014   Procedure: CIRCUMCISION ADULT (procedure #1);  Surgeon: Marissa Nestle, MD;  Location: [facility];  Service: Urology;  Laterality: N/A;  . COLECTOMY N/A 05/04/2017   Procedure: TOTAL COLECTOMY;  Surgeon: Aviva Signs, MD;  Location: [facility];  Service: General;  Laterality: N/A;  . COLONOSCOPY N/A 04/27/2017   Procedure: COLONOSCOPY;  Surgeon: Daneil Dolin, MD;  [facility];  Service: Endoscopy;  Laterality: N/A;  245  . CYSTOSCOPY W/ RETROGRADES Bilateral 06/29/2015   Procedure: CYSTOSCOPY, DILATION OF URETHRAL STRICTURE WITH BILATERAL RETROGRADE PYELOGRAM,SUPRAPUBIC TUBE CHANGE;  Surgeon: Festus Aloe, MD;  Location: [facility];  Service: Urology;  Laterality: Bilateral;  . CYSTOSCOPY WITH URETHRAL DILATATION N/A 12/29/2013   Procedure: CYSTOSCOPY WITH URETHRAL DILATATION;  Surgeon: Marissa Nestle, MD;  [facility];  Service: Urology;  Laterality: N/A;  . ESOPHAGOGASTRODUODENOSCOPY N/A 04/27/2017   Procedure: ESOPHAGOGASTRODUODENOSCOPY (EGD);  Surgeon: Daneil Dolin, MD;  [facility];   Service: Endoscopy;  Laterality: N/A;  . INSERTION OF DIALYSIS CATHETER Right 11/25/2018   Procedure: INSERTION OF DIALYSIS CATHETER RIGHT INTERNAL JUGULAR;  Surgeon: Waynetta Sandy, MD;  Location: [facility];  Service: Vascular;  Laterality: Right;  . IR AV DIALY SHUNT INTRO [place] W/PTA/IMG RIGHT Right 09/07/2018  . IR REMOVAL TUN CV CATH W/O FL  01/12/2019  . IR THROMBECTOMY AV FISTULA W/THROMBOLYSIS/PTA INC/SHUNT/IMG RIGHT Right 04/26/2018  . IR US GUIDE VASC ACCESS RIGHT  04/26/2018  . IR US GUIDE VASC ACCESS RIGHT  09/07/2018  . ORIF FEMUR FRACTURE Right 11/22/2016   Procedure: OPEN REDUCTION INTERNAL FIXATION (ORIF) DISTAL FEMUR FRACTURE;  Surgeon: Rod Can, MD;  Location: [facility];  Service: Orthopedics;  Laterality: Right;  . PATCH ANGIOPLASTY Right 12/10/2017   Procedure: PATCH ANGIOPLASTY;  Surgeon: Angelia Mould, MD;  [facility];  Service: Vascular;  Laterality: Right;  . PERIPHERAL VASCULAR BALLOON ANGIOPLASTY  08/13/2018   Procedure: PERIPHERAL VASCULAR BALLOON ANGIOPLASTY;  Surgeon: Elam Dutch, MD;  Location: [facility];  Service: Cardiovascular;;  right AV fistula   . PERIPHERAL VASCULAR BALLOON ANGIOPLASTY  11/22/2018   Procedure:  PERIPHERAL VASCULAR BALLOON ANGIOPLASTY;  Surgeon: Waynetta Sandy, MD;  Location: [facility];  Service: Cardiovascular;;  rt AV fistula  . PERIPHERAL VASCULAR CATHETERIZATION N/A 10/08/2015   Procedure: A/V Shuntogram;  Surgeon: Angelia Mould, MD;  Location: [facility];  Service: Cardiovascular;  Laterality: N/A;  . THROMBECTOMY W/ EMBOLECTOMY Right 12/10/2017   Procedure: THROMBECTOMY REVISION RIGHT ARM  ARTERIOVENOUS FISTULA;  Surgeon: Angelia Mould, MD;  Location: [facility];  Service: Vascular;  Laterality: Right;  . TRANSURETHRAL RESECTION OF PROSTATE N/A 01/04/2014   Procedure: TRANSURETHRAL RESECTION OF THE PROSTATE (TURP) (procedure #2);  Surgeon: Marissa Nestle, MD;  [facility];  Service: Urology;  Laterality: N/A;    No Known Allergies  Outpatient Encounter Medications as of 11/21/2019  Medication Sig  . atorvastatin (LIPITOR) 80 MG tablet Take 80 mg by mouth every evening.  . insulin aspart (NOVOLOG FLEXPEN) 100 UNIT/ML FlexPen Inject 8 Units into the skin 3 (three) times daily with meals.   . Insulin Glargine (BASAGLAR KWIKPEN) 100 UNIT/ML SOPN Inject 30 Units into the skin at bedtime.   Marland Kitchen JANUVIA 25 MG tablet Take 25 mg by mouth daily.   Marland Kitchen levothyroxine (SYNTHROID, LEVOTHROID) 88 MCG tablet Take 88 mcg by mouth daily before breakfast.  . midodrine (PROAMATINE) 10 MG tablet Take 10 mg by mouth. Send med with resident on Mon/Wed/Fri to dialysis. do not give [facility] give to resident to take with him. send with 5mg  to equal 15mg .  . midodrine (PROAMATINE) 5 MG tablet Take 5 mg by mouth. Send with resident on dialysis days.  Mon, Wed, Fri.  Take along with 10 mg to equal 15 mg  . NON FORMULARY Fluid Restriction - 1200 cc a day 500 cc for days, Evenings 500 cc, Night 200 cc  . NON FORMULARY Diet Type:  NAS, Cons CHO Diet  . Nutritional Supplements (NEPRO/CARBSTEADY PO) Take 1 Can by mouth 2 (two) times daily.  Marland Kitchen omega-3 acid ethyl esters (LOVAZA) 1 g capsule 1 gram; amt: 2 capsules; oral  Special Instructions: elevated triglycerides At Bedtime 09:00 PM  . omeprazole (PRILOSEC) 40 MG capsule Take 40 mg by mouth at bedtime.   . polyethylene glycol (MIRALAX / GLYCOLAX) packet Take 17 g by mouth as needed for mild constipation.   . sertraline (ZOLOFT) 50 MG tablet Take 50 mg by mouth daily.  . sevelamer carbonate (RENVELA) 800 MG tablet Take 1,600 mg by mouth 3 (three) times daily.   . sevelamer carbonate (RENVELA) 800 MG tablet Take 800 mg by mouth daily. With snacks  . tamsulosin (FLOMAX) 0.4 MG CAPS capsule Take 0.4 mg by mouth every evening. Give 30 minutes after a meal  . torsemide (DEMADEX) 10 MG tablet Take 5 tablets (50 mg  total) by mouth daily.   No facility-administered encounter medications on file as of 11/21/2019.    Review of Systems \ This is limited secondary to patient being a poor historian provided by nursing as well.  In general no complaints of fever or chills.  Skin no complaints of rashes or itching.  Head ears eyes nose mouth and throat does not complain of visual changes or sore throat.  Respiratory no complaints of shortness of breath or cough.  Cardiac does not complain of chest pain.  GI is not complaining of abdominal pain nausea vomiting diarrhea constipation continues to have apparently a very good appetite.  GU does have end-stage renal disease on dialysis does not complain  of dysuria.  Musculoskeletal does not complain of joint pain is status right femur ORIF.  Neurologic does not complain of dizziness headache or numbness.  Psych does have a history of some cognitive deficits does not complain of being depressed or anxious continues to be somewhat happy-go-lucky mood  Immunization History  Administered Date(s) Administered  . Influenza,inj,Quad PF,6+ Mos 12/26/2013, 07/09/2015  . Influenza-Unspecified 07/31/2017, 07/29/2018, 08/08/2019  . PPD Test 03/07/2015  . Pneumococcal Conjugate-13 07/31/2017  . Pneumococcal Polysaccharide-23 03/04/2015  . Pneumococcal-Unspecified 12/03/2016  . Tdap 08/22/2017   Pertinent  Health Maintenance Due  Topic Date Due  . FOOT EXAM  12/29/2019  . HEMOGLOBIN A1C  03/06/2020  . OPHTHALMOLOGY EXAM  09/07/2020  . COLONOSCOPY  04/28/2027  . INFLUENZA VACCINE  Completed  . PNA vac Low Risk Adult  Completed  . URINE MICROALBUMIN  Discontinued   Fall Risk  09/15/2019 06/04/2019 05/26/2019 02/18/2018  Falls in the past year? 0 - (No Data) No  Comment - - [facility] Telephone Survey: data to providers prior to load -  Number falls in past yr: - 0 (No Data) -  Comment - - [facility] Telephone Survey Actual Response =  -  Risk for fall due to : - Impaired  mobility - -   Functional Status Survey:    Vitals:   11/21/19 1337  BP: 111/68  Pulse: 72  Resp: 18  Temp: 98.4 F (36.9 C)  TempSrc: Oral  SpO2: 100%  Weight: 211 lb 6.4 oz (95.9 kg)  Height: 5\' 8"  (1.727 m)   Body mass index is 32.14 kg/m. Physical Exam General this is a pleasant elderly male in no distress.  His skin is warm and dry.  Eyes visual acuity appears to be intact.  Chest is clear to auscultation there is no labored breathing.  Heart is regular rate and rhythm without murmur gallop or rub.  Abdomen is soft nontender with positive bowel sounds.  Musculoskeletal appears able to move all extremities x4 does have chronic lower extremity weakness.  Psych he is oriented to self he is pleasant appropriate can answer straightforward questions.  Continues to be in good spirits.   Labs reviewed: Recent Labs    01/25/19 0700 06/03/19 0325 09/16/19 0634  NA 137 137 135  K 4.7 4.6 3.7  CL 97* 102 95*  CO2 28 26 25   GLUCOSE 334* 160* 165*  BUN 25* 30* 40*  CREATININE 6.78* 8.46* 10.47*  CALCIUM 9.0 9.1 10.4*   Recent Labs    01/25/19 0700 06/03/19 0325 09/16/19 0634  AST 21 19 20   ALT 27 25 32  ALKPHOS 125 96 107  BILITOT 0.5 0.5 1.0  PROT 8.0 6.9 9.3*  ALBUMIN 4.0 3.6 4.6   Recent Labs    01/25/19 0700 06/03/19 0325 09/16/19 0634  WBC 5.6 6.3 8.0  NEUTROABS  --   --  5.5  HGB 12.9* 10.8* 16.4  HCT 40.8 33.4* 50.6  MCV 110.3* 106.7* 107.2*  PLT 146* 132* 141*   Lab Results  Component Value Date   TSH 2.752 12/06/2018   Lab Results  Component Value Date   HGBA1C 7.5 (H) 09/07/2019   Lab Results  Component Value Date   CHOL 115 06/03/2019   HDL 24 (L) 06/03/2019   LDLCALC UNABLE TO CALCULATE IF TRIGLYCERIDE OVER 400 mg/dL 06/03/2019   LDLDIRECT 25.5 06/03/2019   TRIG 531 (H) 06/03/2019   CHOLHDL 4.8 06/03/2019    Significant Diagnostic Results in last 30 days:  No results found.  Assessment/Plan #1 history of type 2  diabetes-there are concerns for somewhat lower blood sugar readings on dialysis days will change parameters for NovoLog at meals to not give on dialysis days unless blood sugar is greater than 200-- give if only greater than 150 CBG on nondialysis days   [id number]

## 2019-11-22 ENCOUNTER — Encounter: Payer: Self-pay | Admitting: Internal Medicine

## 2019-11-23 DIAGNOSIS — N186 End stage renal disease: Secondary | ICD-10-CM | POA: Diagnosis not present

## 2019-11-23 DIAGNOSIS — N2581 Secondary hyperparathyroidism of renal origin: Secondary | ICD-10-CM | POA: Diagnosis not present

## 2019-11-23 DIAGNOSIS — D509 Iron deficiency anemia, unspecified: Secondary | ICD-10-CM | POA: Diagnosis not present

## 2019-11-23 DIAGNOSIS — Z992 Dependence on renal dialysis: Secondary | ICD-10-CM | POA: Diagnosis not present

## 2019-11-23 DIAGNOSIS — D631 Anemia in chronic kidney disease: Secondary | ICD-10-CM | POA: Diagnosis not present

## 2019-11-24 DIAGNOSIS — E1129 Type 2 diabetes mellitus with other diabetic kidney complication: Secondary | ICD-10-CM | POA: Diagnosis not present

## 2019-11-24 DIAGNOSIS — I120 Hypertensive chronic kidney disease with stage 5 chronic kidney disease or end stage renal disease: Secondary | ICD-10-CM | POA: Diagnosis not present

## 2019-11-24 DIAGNOSIS — Z1159 Encounter for screening for other viral diseases: Secondary | ICD-10-CM | POA: Diagnosis not present

## 2019-11-25 DIAGNOSIS — Z992 Dependence on renal dialysis: Secondary | ICD-10-CM | POA: Diagnosis not present

## 2019-11-25 DIAGNOSIS — D631 Anemia in chronic kidney disease: Secondary | ICD-10-CM | POA: Diagnosis not present

## 2019-11-25 DIAGNOSIS — D509 Iron deficiency anemia, unspecified: Secondary | ICD-10-CM | POA: Diagnosis not present

## 2019-11-25 DIAGNOSIS — N2581 Secondary hyperparathyroidism of renal origin: Secondary | ICD-10-CM | POA: Diagnosis not present

## 2019-11-25 DIAGNOSIS — N186 End stage renal disease: Secondary | ICD-10-CM | POA: Diagnosis not present

## 2019-11-27 DIAGNOSIS — Z992 Dependence on renal dialysis: Secondary | ICD-10-CM | POA: Diagnosis not present

## 2019-11-27 DIAGNOSIS — N186 End stage renal disease: Secondary | ICD-10-CM | POA: Diagnosis not present

## 2019-11-28 DIAGNOSIS — N186 End stage renal disease: Secondary | ICD-10-CM | POA: Diagnosis not present

## 2019-11-28 DIAGNOSIS — N2581 Secondary hyperparathyroidism of renal origin: Secondary | ICD-10-CM | POA: Diagnosis not present

## 2019-11-28 DIAGNOSIS — D631 Anemia in chronic kidney disease: Secondary | ICD-10-CM | POA: Diagnosis not present

## 2019-11-28 DIAGNOSIS — Z992 Dependence on renal dialysis: Secondary | ICD-10-CM | POA: Diagnosis not present

## 2019-11-28 DIAGNOSIS — D509 Iron deficiency anemia, unspecified: Secondary | ICD-10-CM | POA: Diagnosis not present

## 2019-11-30 DIAGNOSIS — N2581 Secondary hyperparathyroidism of renal origin: Secondary | ICD-10-CM | POA: Diagnosis not present

## 2019-11-30 DIAGNOSIS — D509 Iron deficiency anemia, unspecified: Secondary | ICD-10-CM | POA: Diagnosis not present

## 2019-11-30 DIAGNOSIS — D631 Anemia in chronic kidney disease: Secondary | ICD-10-CM | POA: Diagnosis not present

## 2019-11-30 DIAGNOSIS — N186 End stage renal disease: Secondary | ICD-10-CM | POA: Diagnosis not present

## 2019-11-30 DIAGNOSIS — Z23 Encounter for immunization: Secondary | ICD-10-CM | POA: Diagnosis not present

## 2019-11-30 DIAGNOSIS — Z992 Dependence on renal dialysis: Secondary | ICD-10-CM | POA: Diagnosis not present

## 2019-12-01 DIAGNOSIS — I120 Hypertensive chronic kidney disease with stage 5 chronic kidney disease or end stage renal disease: Secondary | ICD-10-CM | POA: Diagnosis not present

## 2019-12-01 DIAGNOSIS — Z1159 Encounter for screening for other viral diseases: Secondary | ICD-10-CM | POA: Diagnosis not present

## 2019-12-01 DIAGNOSIS — F70 Mild intellectual disabilities: Secondary | ICD-10-CM | POA: Diagnosis not present

## 2019-12-01 DIAGNOSIS — E1129 Type 2 diabetes mellitus with other diabetic kidney complication: Secondary | ICD-10-CM | POA: Diagnosis not present

## 2019-12-01 DIAGNOSIS — F4322 Adjustment disorder with anxiety: Secondary | ICD-10-CM | POA: Diagnosis not present

## 2019-12-02 ENCOUNTER — Non-Acute Institutional Stay (SKILLED_NURSING_FACILITY): Payer: Medicare Other | Admitting: Adult Health

## 2019-12-02 ENCOUNTER — Encounter: Payer: Self-pay | Admitting: Adult Health

## 2019-12-02 DIAGNOSIS — N186 End stage renal disease: Secondary | ICD-10-CM

## 2019-12-02 DIAGNOSIS — E034 Atrophy of thyroid (acquired): Secondary | ICD-10-CM | POA: Diagnosis not present

## 2019-12-02 DIAGNOSIS — N2581 Secondary hyperparathyroidism of renal origin: Secondary | ICD-10-CM | POA: Diagnosis not present

## 2019-12-02 DIAGNOSIS — D631 Anemia in chronic kidney disease: Secondary | ICD-10-CM | POA: Diagnosis not present

## 2019-12-02 DIAGNOSIS — F321 Major depressive disorder, single episode, moderate: Secondary | ICD-10-CM | POA: Diagnosis not present

## 2019-12-02 DIAGNOSIS — Z992 Dependence on renal dialysis: Secondary | ICD-10-CM

## 2019-12-02 DIAGNOSIS — D509 Iron deficiency anemia, unspecified: Secondary | ICD-10-CM | POA: Diagnosis not present

## 2019-12-02 NOTE — Progress Notes (Signed)
Location:    [facility] Room Number: 112D Place of Service:  SNF (31) Phillips Grout NP   CODE STATUS: FULL CODE  No Known Allergies  Chief Complaint  Patient presents with  . 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 there are no reports of uncontrolled pain; no changes in appetite; no reports of anxiety or depressive thoughts.   Past Medical History:  Diagnosis Date  . Abnormal CT scan, kidney 10/06/2011  . Acute pyelonephritis 10/07/2011  . Anemia    normocytic  . Anxiety    mental retardation  . Bladder wall thickening 10/06/2011  . BPH (benign prostatic hypertrophy)   . Diabetes mellitus   . Dialysis patient [facility])    Tuesday, Thursday and Saturday,   . DVT of leg (deep venous thrombosis) ([place]) 12/25/2016  . Edema     history of lower extremity edema  . GERD (gastroesophageal reflux disease)   . Heme positive stool   . Hydronephrosis   . Hyperkalemia   . Hyperlipidemia   . Hypernatremia   . Hypertension   . Hypothyroidism   . Impaired speech   . 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 MR (mental retardation)   . Muscle weakness   . Obstructive uropathy   . 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 Poor historian poor historian  . Protein calorie malnutrition ([place])   . Pyelonephritis   . Renal failure (ARF), acute on chronic (HCC)   . Renal insufficiency    chronic history  . Sepsis ([place])   . Smoking   . Uremia   . Urinary retention   . UTI (lower urinary tract infection) 10/06/2011    Past Surgical History:  Procedure Laterality Date  . A/V FISTULAGRAM N/A 08/13/2018   Procedure: A/V FISTULAGRAM - Right Upper;  Surgeon: Elam Dutch, MD;   Location: [facility];  Service: Cardiovascular;  Laterality: N/A;  . A/V FISTULAGRAM N/A 11/22/2018   Procedure: A/V FISTULAGRAM - Right Upper;  Surgeon: Waynetta Sandy, MD;  Location: [place] [facility];  Service: Cardiovascular;  Laterality: N/A;  . AV FISTULA PLACEMENT Left 07/06/2015   Procedure:  INSERTION LEFT ARM ARTERIOVENOUS GORTEX GRAFT;  Surgeon: Angelia Mould, MD;  Location: [facility];  Service: Vascular;  Laterality: Left;  . AV FISTULA PLACEMENT Right 02/26/2016   Procedure: ARTERIOVENOUS (AV) FISTULA CREATION ;  Surgeon: Angelia Mould, MD;  Location: [facility];  Service: Vascular;  Laterality: Right;  . AV FISTULA PLACEMENT Right 11/25/2018   Procedure: INSERTION OF ARTERIOVENOUS (AV) ARTEGRAFT RIGHT UPPER ARM;  Surgeon: Waynetta Sandy, MD;  Location: [facility];  Service: Vascular;  Laterality: Right;  . [place] REMOVAL Left 10/09/2015   Procedure: REMOVAL OF ARTERIOVENOUS GORETEX GRAFT ([place]) Evacuation of Lymphocele, Vein Patch angioplasty of brachial artery.;  Surgeon: Angelia Mould, MD;  Location: [facility];  Service: Vascular;  Laterality: Left;  . BASCILIC VEIN TRANSPOSITION Right 02/26/2016   Procedure: Right BASCILIC VEIN TRANSPOSITION;  Surgeon: Angelia Mould, MD;  Location: [facility];  Service: Vascular;  Laterality: Right;  . CIRCUMCISION N/A 01/04/2014   Procedure: CIRCUMCISION ADULT (procedure #1);  Surgeon: Marissa Nestle,  MD;  Location: [facility];  Service: Urology;  Laterality: N/A;  . COLECTOMY N/A 05/04/2017   Procedure: TOTAL COLECTOMY;  Surgeon: Aviva Signs, MD;  Location: [facility];  Service: General;  Laterality: N/A;  . COLONOSCOPY N/A 04/27/2017   Procedure: COLONOSCOPY;  Surgeon: Daneil Dolin, MD;  [facility];  Service: Endoscopy;  Laterality: N/A;  245  . CYSTOSCOPY W/ RETROGRADES Bilateral 06/29/2015   Procedure: CYSTOSCOPY, DILATION OF URETHRAL STRICTURE WITH BILATERAL RETROGRADE PYELOGRAM,SUPRAPUBIC TUBE CHANGE;   Surgeon: Festus Aloe, MD;  Location: [facility];  Service: Urology;  Laterality: Bilateral;  . CYSTOSCOPY WITH URETHRAL DILATATION N/A 12/29/2013   Procedure: CYSTOSCOPY WITH URETHRAL DILATATION;  Surgeon: Marissa Nestle, MD;  [facility];  Service: Urology;  Laterality: N/A;  . ESOPHAGOGASTRODUODENOSCOPY N/A 04/27/2017   Procedure: ESOPHAGOGASTRODUODENOSCOPY (EGD);  Surgeon: Daneil Dolin, MD;  [facility];  Service: Endoscopy;  Laterality: N/A;  . INSERTION OF DIALYSIS CATHETER Right 11/25/2018   Procedure: INSERTION OF DIALYSIS CATHETER RIGHT INTERNAL JUGULAR;  Surgeon: Waynetta Sandy, MD;  Location: [facility];  Service: Vascular;  Laterality: Right;  . IR AV DIALY SHUNT INTRO [place] W/PTA/IMG RIGHT Right 09/07/2018  . IR REMOVAL TUN CV CATH W/O FL  01/12/2019  . IR THROMBECTOMY AV FISTULA W/THROMBOLYSIS/PTA INC/SHUNT/IMG RIGHT Right 04/26/2018  . IR US GUIDE VASC ACCESS RIGHT  04/26/2018  . IR US GUIDE VASC ACCESS RIGHT  09/07/2018  . ORIF FEMUR FRACTURE Right 11/22/2016   Procedure: OPEN REDUCTION INTERNAL FIXATION (ORIF) DISTAL FEMUR FRACTURE;  Surgeon: Rod Can, MD;  Location: [facility];  Service: Orthopedics;  Laterality: Right;  . PATCH ANGIOPLASTY Right 12/10/2017   Procedure: PATCH ANGIOPLASTY;  Surgeon: Angelia Mould, MD;  Location[facility];  Service: Vascular;  Laterality: Right;  . PERIPHERAL VASCULAR BALLOON ANGIOPLASTY  08/13/2018   Procedure: PERIPHERAL VASCULAR BALLOON ANGIOPLASTY;  Surgeon: Elam Dutch, MD;  Location: [facility];  Service: Cardiovascular;;  right AV fistula   . PERIPHERAL VASCULAR BALLOON ANGIOPLASTY  11/22/2018   Procedure: PERIPHERAL VASCULAR BALLOON ANGIOPLASTY;  Surgeon: Waynetta Sandy, MD;  Location: [place] [facility];  Service: Cardiovascular;;  rt AV fistula  . PERIPHERAL VASCULAR CATHETERIZATION N/A 10/08/2015   Procedure: A/V Shuntogram;  Surgeon: Angelia Mould, MD;   Location: [place] [facility];  Service: Cardiovascular;  Laterality: N/A;  . THROMBECTOMY W/ EMBOLECTOMY Right 12/10/2017   Procedure: THROMBECTOMY REVISION RIGHT ARM  ARTERIOVENOUS FISTULA;  Surgeon: Angelia Mould, MD;  Location: [facility];  Service: Vascular;  Laterality: Right;  . TRANSURETHRAL RESECTION OF PROSTATE N/A 01/04/2014   Procedure: TRANSURETHRAL RESECTION OF THE PROSTATE (TURP) (procedure #2);  Surgeon: Marissa Nestle, MD;  [facility];  Service: Urology;  Laterality: N/A;    Social History   Socioeconomic History  . Marital status: Single    Spouse name: Not on file  . Number of children: Not on file  . Years of education: Not on file  . Highest education level: Not on file  Occupational History  . Occupation: retired   Tobacco Use  . Smoking status: Never Smoker  . Smokeless tobacco: Never Used  Substance and Sexual Activity  . Alcohol use: No  . Drug use: No  . Sexual activity: Not Currently  Other Topics Concern  . 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   Long term resident of [facility]  Determinants of Health   Financial Resource Strain:   . Difficulty of Paying Living Expenses: Not on file  Food Insecurity:   . Worried About Charity fundraiser in the Last Year: Not on file  . Ran Out of Food in the Last Year: Not on file  Transportation Needs:   . Lack of Transportation (Medical): Not on file  . Lack of Transportation (Non-Medical): Not on file  Physical Activity: Unknown  . Days of Exercise per Week: Not on file  . Minutes of Exercise per Session: 0 min  Stress:   . Feeling of Stress : Not on file  Social Connections: Unknown  . Frequency of Communication with Friends and Family: Not on file  . Frequency of Social Gatherings with Friends and Family: Never  . Attends Religious Services: Not on file  . Active Member of Clubs or Organizations: Not on file  .  Attends Archivist Meetings: Not on file  . Marital Status: Not on file  Intimate Partner Violence:   . Fear of Current or Ex-Partner: Not on file  . Emotionally Abused: Not on file  . Physically Abused: Not on file  . Sexually Abused: Not on file   Family History  Problem Relation Age of Onset  . Cancer Mother   . Colon cancer Neg Hx       VITAL SIGNS BP 129/61   Pulse 74   Temp 98 F (36.7 C) (Oral)   Resp 20   Ht 5\' 8"  (1.727 m)   Wt 210 lb 9.6 oz (95.5 kg)   SpO2 100%   BMI 32.02 kg/m   Outpatient Encounter Medications as of 12/02/2019  Medication Sig  . atorvastatin (LIPITOR) 80 MG tablet Take 80 mg by mouth every evening.  . insulin aspart (NOVOLOG FLEXPEN) 100 UNIT/ML FlexPen Inject 8 Units into the skin 3 (three) times daily with meals. Special Instructions: on dialysis days Monday,wednesday and Friday. 8 units if cbg above 150 on non dialysis days  . Insulin Glargine (BASAGLAR KWIKPEN) 100 UNIT/ML SOPN Inject 30 Units into the skin at bedtime.   Marland Kitchen JANUVIA 25 MG tablet Take 25 mg by mouth daily.   Marland Kitchen levothyroxine (SYNTHROID, LEVOTHROID) 88 MCG tablet Take 88 mcg by mouth daily before breakfast.  . midodrine (PROAMATINE) 10 MG tablet Take 10 mg by mouth. Send med with resident on Mon/Wed/Fri to dialysis. do not give at [facility] give to resident to take with him. send with 5mg  to equal 15mg .  . midodrine (PROAMATINE) 5 MG tablet Take 5 mg by mouth. Send with resident on dialysis days.  Mon, Wed, Fri.  Take along with 10 mg to equal 15 mg  . NON FORMULARY Fluid Restriction - 1200 cc a day 500 cc for days, Evenings 500 cc, Night 200 cc  . NON FORMULARY Diet Type:  NAS, Cons CHO Diet  . Nutritional Supplements (NEPRO/CARBSTEADY PO) Take 1 Can by mouth 2 (two) times daily.  Marland Kitchen omega-3 acid ethyl esters (LOVAZA) 1 g capsule 1 gram; amt: 2 capsules; oral  Special Instructions: elevated triglycerides At Bedtime 09:00 PM  . omeprazole (PRILOSEC) 40 MG capsule Take  40 mg by mouth at bedtime.   . polyethylene glycol (MIRALAX / GLYCOLAX) packet Take 17 g by mouth as needed for mild constipation.   . sertraline (ZOLOFT) 50 MG tablet Take 50 mg by mouth daily.  . sevelamer carbonate (RENVELA) 800 MG tablet Take 1,600 mg by mouth 3 (three) times daily.   Marland Kitchen  sevelamer carbonate (RENVELA) 800 MG tablet Take 800 mg by mouth daily. With snacks  . tamsulosin (FLOMAX) 0.4 MG CAPS capsule Take 0.4 mg by mouth every evening. Give 30 minutes after a meal  . torsemide (DEMADEX) 10 MG tablet Take 5 tablets (50 mg total) by mouth daily.   No facility-administered encounter medications on file as of 12/02/2019.     SIGNIFICANT DIAGNOSTIC EXAMS   LABS REVIEWED PREVIOUS;  12-06-18: tsh 2.752 01-25-19: wbc 5.6; hgb 12.9 hct 40.8; mcv 110.3; plt 146; glucose 334; bun 25; creat 6.78; k+ 4.7; na++ 137; ca 9.0 liver normal albumin 4.0  Hgb a1c 6.9; chol 94 trig 439 LDL direct 16.3 05-10-19 hgb a1c 7.6   06-03-19: wbc 6.3; hgb 10.8; hct 33.4; mcv 106.7; plt 132; glucose 160; bun 30; creat 8.46; k+ 4.6; na++ 137; ca 9.1; liver normal albumin 3.6; chol 115; LDL 25.5; trig 531; hdl 24; PSA 2.73 09-07-19: hgb a1c 7.5 09-16-19: wbc 8.0; hgb 16.4; hct 50.6; mcv 107.2 plt 141; glucose 165; bun 40; creat 10.47; k+ 3.7; na++ 135; ca 9.3; liver normal albumin 4.6; stool guaiac neg   NO NEW LABS.   Review of Systems  Constitutional: Negative for malaise/fatigue.  Respiratory: Negative for cough and shortness of breath.   Cardiovascular: Negative for chest pain, palpitations and leg swelling.  Gastrointestinal: Negative for abdominal pain, constipation and heartburn.  Musculoskeletal: Negative for back pain, joint pain and myalgias.  Skin: Negative.   Neurological: Negative for dizziness.  Psychiatric/Behavioral: The patient is not nervous/anxious.      Physical Exam Constitutional:      General: He is not in acute distress.    Appearance: He is well-developed. He is not  diaphoretic.  Neck:     Thyroid: No thyromegaly.  Cardiovascular:     Rate and Rhythm: Normal rate and regular rhythm.     Pulses: Normal pulses.     Heart sounds: Normal heart sounds.  Pulmonary:     Effort: Pulmonary effort is normal. No respiratory distress.     Breath sounds: Normal breath sounds.  Abdominal:     General: Bowel sounds are normal. There is no distension.     Palpations: Abdomen is soft.     Tenderness: There is no abdominal tenderness.  Genitourinary:    Comments: History of turp  Musculoskeletal:     Cervical back: Neck supple.     Right lower leg: No edema.     Left lower leg: No edema.     Comments: Is able to move all extremities History of right femur ORIF      Lymphadenopathy:     Cervical: No cervical adenopathy.  Skin:    General: Skin is warm and dry.     Comments:  Right upper extremity a/v fistula + thrill + bruit     Neurological:     Mental Status: He is alert. Mental status is at baseline.  Psychiatric:        Mood and Affect: Mood normal.       ASSESSMENT/ PLAN:   TODAY  1. Other specified hypothyroidism: is stable tsh 2.752 will continue synthroid 88 mcg daily   2. 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 Major depression single episode moderate: is stable will continue zoloft 50 mg daily will monitor   PREVIOUS   4. Bilateral lower extremity edema: stable will continue demadex 50 mg daily   5. Orthostatic hypotension: is stable  129/61  will continue midodrine 15 mg with dialysis   6. GERD without esophagitis: is stable will continue prilosec 40 mg daily    7. 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 Dyslipidemia associated with type 2 diabetes mellitus is stable trig 439; will continue lipitor 80 mg daily and lovaza 2 gm daily    9. End stage renal disease on hemodialysis due to type 2 diabetes mellitus: is stable will continue dialysis 3 days weekly followed by nephrology; will continue midodrine 15 mg with dialysis treatments 1200 cc fluid restriction; renvela 1600 mg with meals and 800 mg with snacks  Will check lipids and tsh hgb a1c   MD is aware of resident's narcotic use and is in agreement with current plan of care. We will attempt to wean resident as appropriate.  Ok Edwards NP [facility]  Contact [phone] Monday through Friday 8am- 5pm  After hours call [phone]

## 2019-12-05 DIAGNOSIS — D509 Iron deficiency anemia, unspecified: Secondary | ICD-10-CM | POA: Diagnosis not present

## 2019-12-05 DIAGNOSIS — N186 End stage renal disease: Secondary | ICD-10-CM | POA: Diagnosis not present

## 2019-12-05 DIAGNOSIS — N2581 Secondary hyperparathyroidism of renal origin: Secondary | ICD-10-CM | POA: Diagnosis not present

## 2019-12-05 DIAGNOSIS — D631 Anemia in chronic kidney disease: Secondary | ICD-10-CM | POA: Diagnosis not present

## 2019-12-05 DIAGNOSIS — Z992 Dependence on renal dialysis: Secondary | ICD-10-CM | POA: Diagnosis not present

## 2019-12-09 ENCOUNTER — Other Ambulatory Visit (HOSPITAL_COMMUNITY)
Admission: RE | Admit: 2019-12-09 | Discharge: 2019-12-09 | Disposition: A | Discharge status: Home / Self Care | Payer: Medicare Other | Source: Skilled Nursing Facility | Attending: Adult Health | Admitting: Adult Health

## 2019-12-09 DIAGNOSIS — E1129 Type 2 diabetes mellitus with other diabetic kidney complication: Secondary | ICD-10-CM | POA: Insufficient documentation

## 2019-12-09 DIAGNOSIS — Z992 Dependence on renal dialysis: Secondary | ICD-10-CM | POA: Diagnosis not present

## 2019-12-09 DIAGNOSIS — Z1159 Encounter for screening for other viral diseases: Secondary | ICD-10-CM | POA: Diagnosis not present

## 2019-12-09 DIAGNOSIS — D631 Anemia in chronic kidney disease: Secondary | ICD-10-CM | POA: Diagnosis not present

## 2019-12-09 DIAGNOSIS — D509 Iron deficiency anemia, unspecified: Secondary | ICD-10-CM | POA: Diagnosis not present

## 2019-12-09 DIAGNOSIS — N186 End stage renal disease: Secondary | ICD-10-CM | POA: Diagnosis not present

## 2019-12-09 DIAGNOSIS — I120 Hypertensive chronic kidney disease with stage 5 chronic kidney disease or end stage renal disease: Secondary | ICD-10-CM | POA: Diagnosis not present

## 2019-12-09 DIAGNOSIS — N2581 Secondary hyperparathyroidism of renal origin: Secondary | ICD-10-CM | POA: Diagnosis not present

## 2019-12-09 LAB — HEMOGLOBIN A1C
Hgb A1c MFr Bld: 6.9 % — ABNORMAL HIGH (ref 4.8–5.6)
Mean Plasma Glucose: 151.33 mg/dL

## 2019-12-09 LAB — LIPID PANEL
Cholesterol: 94 mg/dL (ref 0–200)
HDL: 22 mg/dL — ABNORMAL LOW (ref >40)
LDL Cholesterol: 4 mg/dL (ref 0–99)
Total CHOL/HDL Ratio: 4.3 RATIO
Triglycerides: 339 mg/dL — ABNORMAL HIGH (ref <150)
VLDL: 68 mg/dL — ABNORMAL HIGH (ref 0–40)

## 2019-12-09 LAB — TSH: TSH: 3.202 u[IU]/mL (ref 0.350–4.500)

## 2019-12-12 DIAGNOSIS — N186 End stage renal disease: Secondary | ICD-10-CM | POA: Diagnosis not present

## 2019-12-12 DIAGNOSIS — D509 Iron deficiency anemia, unspecified: Secondary | ICD-10-CM | POA: Diagnosis not present

## 2019-12-12 DIAGNOSIS — Z992 Dependence on renal dialysis: Secondary | ICD-10-CM | POA: Diagnosis not present

## 2019-12-12 DIAGNOSIS — D631 Anemia in chronic kidney disease: Secondary | ICD-10-CM | POA: Diagnosis not present

## 2019-12-12 DIAGNOSIS — N2581 Secondary hyperparathyroidism of renal origin: Secondary | ICD-10-CM | POA: Diagnosis not present

## 2019-12-14 DIAGNOSIS — D509 Iron deficiency anemia, unspecified: Secondary | ICD-10-CM | POA: Diagnosis not present

## 2019-12-14 DIAGNOSIS — N2581 Secondary hyperparathyroidism of renal origin: Secondary | ICD-10-CM | POA: Diagnosis not present

## 2019-12-14 DIAGNOSIS — N186 End stage renal disease: Secondary | ICD-10-CM | POA: Diagnosis not present

## 2019-12-14 DIAGNOSIS — D631 Anemia in chronic kidney disease: Secondary | ICD-10-CM | POA: Diagnosis not present

## 2019-12-14 DIAGNOSIS — Z992 Dependence on renal dialysis: Secondary | ICD-10-CM | POA: Diagnosis not present

## 2019-12-16 DIAGNOSIS — D509 Iron deficiency anemia, unspecified: Secondary | ICD-10-CM | POA: Diagnosis not present

## 2019-12-16 DIAGNOSIS — Z992 Dependence on renal dialysis: Secondary | ICD-10-CM | POA: Diagnosis not present

## 2019-12-16 DIAGNOSIS — N186 End stage renal disease: Secondary | ICD-10-CM | POA: Diagnosis not present

## 2019-12-16 DIAGNOSIS — Z1159 Encounter for screening for other viral diseases: Secondary | ICD-10-CM | POA: Diagnosis not present

## 2019-12-16 DIAGNOSIS — E1129 Type 2 diabetes mellitus with other diabetic kidney complication: Secondary | ICD-10-CM | POA: Diagnosis not present

## 2019-12-16 DIAGNOSIS — D631 Anemia in chronic kidney disease: Secondary | ICD-10-CM | POA: Diagnosis not present

## 2019-12-16 DIAGNOSIS — N2581 Secondary hyperparathyroidism of renal origin: Secondary | ICD-10-CM | POA: Diagnosis not present

## 2019-12-16 DIAGNOSIS — I120 Hypertensive chronic kidney disease with stage 5 chronic kidney disease or end stage renal disease: Secondary | ICD-10-CM | POA: Diagnosis not present

## 2019-12-19 DIAGNOSIS — N2581 Secondary hyperparathyroidism of renal origin: Secondary | ICD-10-CM | POA: Diagnosis not present

## 2019-12-19 DIAGNOSIS — D509 Iron deficiency anemia, unspecified: Secondary | ICD-10-CM | POA: Diagnosis not present

## 2019-12-19 DIAGNOSIS — Z992 Dependence on renal dialysis: Secondary | ICD-10-CM | POA: Diagnosis not present

## 2019-12-19 DIAGNOSIS — D631 Anemia in chronic kidney disease: Secondary | ICD-10-CM | POA: Diagnosis not present

## 2019-12-19 DIAGNOSIS — N186 End stage renal disease: Secondary | ICD-10-CM | POA: Diagnosis not present

## 2019-12-21 DIAGNOSIS — N2581 Secondary hyperparathyroidism of renal origin: Secondary | ICD-10-CM | POA: Diagnosis not present

## 2019-12-21 DIAGNOSIS — Z992 Dependence on renal dialysis: Secondary | ICD-10-CM | POA: Diagnosis not present

## 2019-12-21 DIAGNOSIS — N186 End stage renal disease: Secondary | ICD-10-CM | POA: Diagnosis not present

## 2019-12-21 DIAGNOSIS — D631 Anemia in chronic kidney disease: Secondary | ICD-10-CM | POA: Diagnosis not present

## 2019-12-21 DIAGNOSIS — D509 Iron deficiency anemia, unspecified: Secondary | ICD-10-CM | POA: Diagnosis not present

## 2019-12-25 DIAGNOSIS — Z992 Dependence on renal dialysis: Secondary | ICD-10-CM | POA: Diagnosis not present

## 2019-12-25 DIAGNOSIS — N186 End stage renal disease: Secondary | ICD-10-CM | POA: Diagnosis not present

## 2019-12-26 DIAGNOSIS — N186 End stage renal disease: Secondary | ICD-10-CM | POA: Diagnosis not present

## 2019-12-26 DIAGNOSIS — N2581 Secondary hyperparathyroidism of renal origin: Secondary | ICD-10-CM | POA: Diagnosis not present

## 2019-12-26 DIAGNOSIS — Z992 Dependence on renal dialysis: Secondary | ICD-10-CM | POA: Diagnosis not present

## 2019-12-26 DIAGNOSIS — D631 Anemia in chronic kidney disease: Secondary | ICD-10-CM | POA: Diagnosis not present

## 2019-12-26 DIAGNOSIS — D509 Iron deficiency anemia, unspecified: Secondary | ICD-10-CM | POA: Diagnosis not present

## 2019-12-28 DIAGNOSIS — E1129 Type 2 diabetes mellitus with other diabetic kidney complication: Secondary | ICD-10-CM | POA: Diagnosis not present

## 2019-12-28 DIAGNOSIS — I120 Hypertensive chronic kidney disease with stage 5 chronic kidney disease or end stage renal disease: Secondary | ICD-10-CM | POA: Diagnosis not present

## 2019-12-28 DIAGNOSIS — M6281 Muscle weakness (generalized): Secondary | ICD-10-CM | POA: Diagnosis not present

## 2019-12-28 DIAGNOSIS — Z9181 History of falling: Secondary | ICD-10-CM | POA: Diagnosis not present

## 2019-12-28 DIAGNOSIS — R262 Difficulty in walking, not elsewhere classified: Secondary | ICD-10-CM | POA: Diagnosis not present

## 2019-12-28 DIAGNOSIS — Z1159 Encounter for screening for other viral diseases: Secondary | ICD-10-CM | POA: Diagnosis not present

## 2019-12-29 DIAGNOSIS — M6281 Muscle weakness (generalized): Secondary | ICD-10-CM | POA: Diagnosis not present

## 2019-12-29 DIAGNOSIS — R262 Difficulty in walking, not elsewhere classified: Secondary | ICD-10-CM | POA: Diagnosis not present

## 2019-12-29 DIAGNOSIS — E1129 Type 2 diabetes mellitus with other diabetic kidney complication: Secondary | ICD-10-CM | POA: Diagnosis not present

## 2019-12-29 DIAGNOSIS — Z9181 History of falling: Secondary | ICD-10-CM | POA: Diagnosis not present

## 2019-12-30 DIAGNOSIS — D631 Anemia in chronic kidney disease: Secondary | ICD-10-CM | POA: Diagnosis not present

## 2019-12-30 DIAGNOSIS — Z9181 History of falling: Secondary | ICD-10-CM | POA: Diagnosis not present

## 2019-12-30 DIAGNOSIS — R262 Difficulty in walking, not elsewhere classified: Secondary | ICD-10-CM | POA: Diagnosis not present

## 2019-12-30 DIAGNOSIS — E1129 Type 2 diabetes mellitus with other diabetic kidney complication: Secondary | ICD-10-CM | POA: Diagnosis not present

## 2019-12-30 DIAGNOSIS — M6281 Muscle weakness (generalized): Secondary | ICD-10-CM | POA: Diagnosis not present

## 2019-12-30 DIAGNOSIS — N2581 Secondary hyperparathyroidism of renal origin: Secondary | ICD-10-CM | POA: Diagnosis not present

## 2019-12-30 DIAGNOSIS — N186 End stage renal disease: Secondary | ICD-10-CM | POA: Diagnosis not present

## 2019-12-30 DIAGNOSIS — D509 Iron deficiency anemia, unspecified: Secondary | ICD-10-CM | POA: Diagnosis not present

## 2019-12-30 DIAGNOSIS — Z992 Dependence on renal dialysis: Secondary | ICD-10-CM | POA: Diagnosis not present

## 2020-01-02 DIAGNOSIS — E1129 Type 2 diabetes mellitus with other diabetic kidney complication: Secondary | ICD-10-CM | POA: Diagnosis not present

## 2020-01-02 DIAGNOSIS — R262 Difficulty in walking, not elsewhere classified: Secondary | ICD-10-CM | POA: Diagnosis not present

## 2020-01-02 DIAGNOSIS — Z9181 History of falling: Secondary | ICD-10-CM | POA: Diagnosis not present

## 2020-01-02 DIAGNOSIS — D631 Anemia in chronic kidney disease: Secondary | ICD-10-CM | POA: Diagnosis not present

## 2020-01-02 DIAGNOSIS — N2581 Secondary hyperparathyroidism of renal origin: Secondary | ICD-10-CM | POA: Diagnosis not present

## 2020-01-02 DIAGNOSIS — D509 Iron deficiency anemia, unspecified: Secondary | ICD-10-CM | POA: Diagnosis not present

## 2020-01-02 DIAGNOSIS — Z992 Dependence on renal dialysis: Secondary | ICD-10-CM | POA: Diagnosis not present

## 2020-01-02 DIAGNOSIS — N186 End stage renal disease: Secondary | ICD-10-CM | POA: Diagnosis not present

## 2020-01-02 DIAGNOSIS — M6281 Muscle weakness (generalized): Secondary | ICD-10-CM | POA: Diagnosis not present

## 2020-01-03 DIAGNOSIS — R262 Difficulty in walking, not elsewhere classified: Secondary | ICD-10-CM | POA: Diagnosis not present

## 2020-01-03 DIAGNOSIS — Z9181 History of falling: Secondary | ICD-10-CM | POA: Diagnosis not present

## 2020-01-03 DIAGNOSIS — M6281 Muscle weakness (generalized): Secondary | ICD-10-CM | POA: Diagnosis not present

## 2020-01-03 DIAGNOSIS — E1129 Type 2 diabetes mellitus with other diabetic kidney complication: Secondary | ICD-10-CM | POA: Diagnosis not present

## 2020-01-04 ENCOUNTER — Non-Acute Institutional Stay (SKILLED_NURSING_FACILITY): Payer: Medicare Other | Admitting: Internal Medicine

## 2020-01-04 ENCOUNTER — Encounter: Payer: Self-pay | Admitting: Adult Health

## 2020-01-04 ENCOUNTER — Encounter: Payer: Self-pay | Admitting: Internal Medicine

## 2020-01-04 DIAGNOSIS — R600 Localized edema: Secondary | ICD-10-CM

## 2020-01-04 DIAGNOSIS — R338 Other retention of urine: Secondary | ICD-10-CM | POA: Diagnosis not present

## 2020-01-04 DIAGNOSIS — Z992 Dependence on renal dialysis: Secondary | ICD-10-CM | POA: Diagnosis not present

## 2020-01-04 DIAGNOSIS — D631 Anemia in chronic kidney disease: Secondary | ICD-10-CM | POA: Diagnosis not present

## 2020-01-04 DIAGNOSIS — N2581 Secondary hyperparathyroidism of renal origin: Secondary | ICD-10-CM | POA: Diagnosis not present

## 2020-01-04 DIAGNOSIS — N401 Enlarged prostate with lower urinary tract symptoms: Secondary | ICD-10-CM | POA: Diagnosis not present

## 2020-01-04 DIAGNOSIS — F321 Major depressive disorder, single episode, moderate: Secondary | ICD-10-CM

## 2020-01-04 DIAGNOSIS — D509 Iron deficiency anemia, unspecified: Secondary | ICD-10-CM | POA: Diagnosis not present

## 2020-01-04 DIAGNOSIS — N186 End stage renal disease: Secondary | ICD-10-CM | POA: Diagnosis not present

## 2020-01-04 NOTE — Progress Notes (Signed)
Location:  [facility] Room Number: 112-D Place of Service:  SNF (31)  Hennie Duos, MD  Patient Care Team: Hennie Duos, MD as PCP - General (Internal Medicine) Cassandria Anger, MD as Consulting Physician (Endocrinology) Fran Lowes, MD (Inactive) as Consulting Physician (Nephrology) Gala Romney Cristopher Estimable, MD as Consulting Physician (Gastroenterology) [facility] ([place]) Gerlene Fee, NP as Nurse Practitioner (Geriatric Medicine)  Extended Emergency Contact Information Primary Emergency Contact: Betsey Amen Address: DSS  [place] Phone: [phone] Work Phone: [phone] Relation: Legal Guardian Secondary Emergency Contact: Kendzierski,Richard Address: [address]          [place], [place] [address] [place] Phone: [phone] Relation: Brother    Allergies: No known allergies  Chief Complaint  Patient presents with  . Medical Management of Chronic Issues    Routine [place] visit    HPI: Patient is a 74 y.o. male who is being seen for routine issues of BPH, bilateral lower extremity edema, and depression.  Past Medical History:  Diagnosis Date  . Abnormal CT scan, kidney 10/06/2011  . Acute pyelonephritis 10/07/2011  . Anemia    normocytic  . Anxiety    mental retardation  . Bladder wall thickening 10/06/2011  . BPH (benign prostatic hypertrophy)   . Diabetes mellitus   . Dialysis patient [facility])    Tuesday, Thursday and Saturday,   . DVT of leg (deep venous thrombosis) ([place]) 12/25/2016  . Edema     history of lower extremity edema  . GERD (gastroesophageal reflux disease)   . Heme positive stool   . Hydronephrosis   . Hyperkalemia   . Hyperlipidemia   . Hypernatremia   . Hypertension   . Hypothyroidism   . Impaired speech   . 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 MR (mental retardation)   . Muscle weakness   .  Obstructive uropathy   . 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 Poor historian poor historian  . Protein calorie malnutrition ([place])   . Pyelonephritis   . Renal failure (ARF), acute on chronic (HCC)   . Renal insufficiency    chronic history  . Sepsis ([place])   . Smoking   . Uremia   . Urinary retention   . UTI (lower urinary tract infection) 10/06/2011    Past Surgical History:  Procedure Laterality Date  . A/V FISTULAGRAM N/A 08/13/2018   Procedure: A/V FISTULAGRAM - Right Upper;  Surgeon: Elam Dutch, MD;  Location: [place] CV LAB;  Service: Cardiovascular;  Laterality: N/A;  . A/V FISTULAGRAM N/A 11/22/2018   Procedure: A/V FISTULAGRAM - Right Upper;  Surgeon: Waynetta Sandy, MD;  Location: [place] CV LAB;  Service: Cardiovascular;  Laterality: N/A;  . AV FISTULA PLACEMENT Left 07/06/2015   Procedure:  INSERTION LEFT ARM ARTERIOVENOUS GORTEX GRAFT;  Surgeon: Angelia Mould, MD;  Location: [place];  Service: Vascular;  Laterality: Left;  . AV FISTULA PLACEMENT Right 02/26/2016   Procedure: ARTERIOVENOUS (AV) FISTULA CREATION ;  Surgeon: Angelia Mould, MD;  Location: [facility];  Service: Vascular;  Laterality: Right;  . AV FISTULA PLACEMENT Right 11/25/2018   Procedure: INSERTION OF ARTERIOVENOUS (AV) ARTEGRAFT RIGHT UPPER ARM;  Surgeon: Waynetta Sandy, MD;  Location: [place];  Service: Vascular;  Laterality: Right;  . [place] REMOVAL Left 10/09/2015   Procedure: REMOVAL OF ARTERIOVENOUS GORETEX GRAFT ([place]) Evacuation of Lymphocele, Vein Patch angioplasty of brachial artery.;  Surgeon: Angelia Mould, MD;  Location: [place];  Service: Vascular;  Laterality: Left;  . BASCILIC VEIN TRANSPOSITION Right 02/26/2016   Procedure: Right BASCILIC VEIN TRANSPOSITION;  Surgeon: Angelia Mould, MD;  Location: [facility];  Service: Vascular;  Laterality: Right;  . CIRCUMCISION N/A 01/04/2014   Procedure: CIRCUMCISION ADULT (procedure #1);  Surgeon: Marissa Nestle, MD;  Location[facility];  Service: Urology;  Laterality: N/A;  . COLECTOMY N/A 05/04/2017   Procedure: TOTAL COLECTOMY;  Surgeon: Aviva Signs, MD;  Location[facility];  Service: General;  Laterality: N/A;  . COLONOSCOPY N/A 04/27/2017   Procedure: COLONOSCOPY;  Surgeon: Daneil Dolin, MD;  [facility];  Service: Endoscopy;  Laterality: N/A;  245  . CYSTOSCOPY W/ RETROGRADES Bilateral 06/29/2015   Procedure: CYSTOSCOPY, DILATION OF URETHRAL STRICTURE WITH BILATERAL RETROGRADE PYELOGRAM,SUPRAPUBIC TUBE CHANGE;  Surgeon: Festus Aloe, MD;  Location: [facility];  Service: Urology;  Laterality: Bilateral;  . CYSTOSCOPY WITH URETHRAL DILATATION N/A 12/29/2013   Procedure: CYSTOSCOPY WITH URETHRAL DILATATION;  Surgeon: Marissa Nestle, MD;  [facility];  Service: Urology;  Laterality: N/A;  . ESOPHAGOGASTRODUODENOSCOPY N/A 04/27/2017   Procedure: ESOPHAGOGASTRODUODENOSCOPY (EGD);  Surgeon: Daneil Dolin, MD;  [facility];  Service: Endoscopy;  Laterality: N/A;  . INSERTION OF DIALYSIS CATHETER Right 11/25/2018   Procedure: INSERTION OF DIALYSIS CATHETER RIGHT INTERNAL JUGULAR;  Surgeon: Waynetta Sandy, MD;  Location: [place];  Service: Vascular;  Laterality: Right;  . IR AV DIALY SHUNT INTRO [place] W/PTA/IMG RIGHT Right 09/07/2018  . IR REMOVAL TUN CV CATH W/O FL  01/12/2019  . IR THROMBECTOMY AV FISTULA W/THROMBOLYSIS/PTA INC/SHUNT/IMG RIGHT Right 04/26/2018  . IR US GUIDE VASC ACCESS RIGHT  04/26/2018  . IR US GUIDE VASC ACCESS RIGHT  09/07/2018  . ORIF FEMUR FRACTURE Right 11/22/2016   Procedure: OPEN REDUCTION INTERNAL FIXATION (ORIF) DISTAL FEMUR FRACTURE;  Surgeon: Rod Can, MD;  Location: [place];  Service: Orthopedics;  Laterality: Right;  . PATCH ANGIOPLASTY Right 12/10/2017   Procedure: PATCH ANGIOPLASTY;  Surgeon: Angelia Mould, MD;  Location: [facility];  Service: Vascular;  Laterality: Right;  . PERIPHERAL VASCULAR BALLOON  ANGIOPLASTY  08/13/2018   Procedure: PERIPHERAL VASCULAR BALLOON ANGIOPLASTY;  Surgeon: Elam Dutch, MD;  Location: [place] [facility];  Service: Cardiovascular;;  right AV fistula   . PERIPHERAL VASCULAR BALLOON ANGIOPLASTY  11/22/2018   Procedure: PERIPHERAL VASCULAR BALLOON ANGIOPLASTY;  Surgeon: Waynetta Sandy, MD;  Location: [place] CV LAB;  Service: Cardiovascular;;  rt AV fistula  . PERIPHERAL VASCULAR CATHETERIZATION N/A 10/08/2015   Procedure: A/V Shuntogram;  Surgeon: Angelia Mould, MD;  Location: [place] [facility] LAB;  Service: Cardiovascular;  Laterality: N/A;  . THROMBECTOMY W/ EMBOLECTOMY Right 12/10/2017   Procedure: THROMBECTOMY REVISION RIGHT ARM  ARTERIOVENOUS FISTULA;  Surgeon: Angelia Mould, MD;  Location: [facility];  Service: Vascular;  Laterality: Right;  . TRANSURETHRAL RESECTION OF PROSTATE N/A 01/04/2014   Procedure: TRANSURETHRAL RESECTION OF THE PROSTATE (TURP) (procedure #2);  Surgeon: Marissa Nestle, MD;  [facility];  Service: Urology;  Laterality: N/A;    Allergies as of 01/04/2020      Reactions   No Known Allergies       Medication List    Notice   This visit is during an admission. Changes to the med list made in this visit will be reflected in the After Visit Summary of the admission.    Current  Outpatient Medications on File Prior to Visit  Medication Sig Dispense Refill  . atorvastatin (LIPITOR) 80 MG tablet Take 80 mg by mouth every evening.    . insulin aspart (NOVOLOG FLEXPEN) 100 UNIT/ML FlexPen Inject 8 Units into the skin 3 (three) times daily with meals. Special Instructions: on dialysis days Monday,wednesday and Friday. 8 units if cbg above 150 on non dialysis days    . Insulin Glargine (BASAGLAR KWIKPEN) 100 UNIT/ML SOPN Inject 30 Units into the skin at bedtime.     Marland Kitchen JANUVIA 25 MG tablet Take 25 mg by mouth daily.     Marland Kitchen levothyroxine (SYNTHROID, LEVOTHROID) 88 MCG tablet Take 88 mcg by mouth daily before  breakfast.    . midodrine (PROAMATINE) 10 MG tablet Take 10 mg by mouth. Send med with resident on Mon/Wed/Fri to dialysis. do not give at [facility] give to resident to take with him. send with 5mg  to equal 15mg .    . midodrine (PROAMATINE) 5 MG tablet Take 5 mg by mouth. Send with resident on dialysis days.  Mon, Wed, Fri.  Take along with 10 mg to equal 15 mg    . NON FORMULARY Fluid Restriction - 1200 cc a day 500 cc for days, Evenings 500 cc, Night 200 cc    . NON FORMULARY Diet Type:  NAS, Cons CHO Diet    . Nutritional Supplements (NEPRO/CARBSTEADY PO) Take 1 Can by mouth 2 (two) times daily.    Marland Kitchen omega-3 acid ethyl esters (LOVAZA) 1 g capsule 1 gram; amt: 2 capsules; oral  Special Instructions: elevated triglycerides At Bedtime 09:00 PM    . omeprazole (PRILOSEC) 40 MG capsule Take 40 mg by mouth at bedtime.     . polyethylene glycol (MIRALAX / GLYCOLAX) packet Take 17 g by mouth as needed for mild constipation.     . sertraline (ZOLOFT) 50 MG tablet Take 25 mg by mouth daily.     . sevelamer carbonate (RENVELA) 800 MG tablet Take 1,600 mg by mouth 3 (three) times daily.     . sevelamer carbonate (RENVELA) 800 MG tablet Take 800 mg by mouth daily. With snacks    . tamsulosin (FLOMAX) 0.4 MG CAPS capsule Take 0.4 mg by mouth every evening. Give 30 minutes after a meal    . torsemide (DEMADEX) 10 MG tablet Take 5 tablets (50 mg total) by mouth daily. 30 tablet 3   No current facility-administered medications on file prior to visit.     No orders of the defined types were placed in this encounter.   Immunization History  Administered Date(s) Administered  . Hepatitis B, adult 09/30/2016, 10/28/2016, 12/02/2016, 03/28/2017, 07/02/2017, 08/13/2017, 09/12/2017, 01/14/2018  . Influenza,inj,Quad PF,6+ Mos 12/26/2013, 07/09/2015  . Influenza,inj,Quad PF,6-35 Mos 08/08/2019  . Influenza-Unspecified 07/28/2016, 07/31/2017, 07/29/2018, 08/08/2019  . Moderna SARS-COVID-2 Vaccination  11/02/2019, 11/30/2019  . PPD Test 03/07/2015, 08/08/2016, 05/15/2017, 08/31/2018, 10/10/2019  . Pneumococcal Conjugate-13 07/31/2017  . Pneumococcal Polysaccharide-23 03/04/2015  . Pneumococcal-Unspecified 06/12/2015, 12/03/2016  . Tdap 08/22/2017    Social History   Tobacco Use  . Smoking status: Never Smoker  . Smokeless tobacco: Never Used  Substance Use Topics  . Alcohol use: No    Review of Systems   GENERAL:  no fevers, fatigue, appetite changes SKIN: No itching, rash HEENT: No complaint RESPIRATORY: No cough, wheezing, SOB CARDIAC: No chest pain, palpitations, lower extremity edema  GI: No abdominal pain, No N/V/D or constipation, No heartburn or reflux  GU: No dysuria, frequency or urgency,  or incontinence  MUSCULOSKELETAL: No unrelieved bone/joint pain NEUROLOGIC: No headache, dizziness  PSYCHIATRIC: No overt anxiety or sadness  Vitals:   01/04/20 1515  BP: 129/61  Pulse: 74  Resp: 20  Temp: 98.1 F (36.7 C)  SpO2: 100%   Body mass index is 32.39 kg/m. Physical Exam  GENERAL APPEARANCE: Alert, conversant, No acute distress  SKIN: No diaphoresis rash HEENT: Unremarkable RESPIRATORY: Breathing is even, unlabored. Lung sounds are clear   CARDIOVASCULAR: Heart RRR no murmurs, rubs or gallops. No peripheral edema; right upper extremity AV fistula with thrill GASTROINTESTINAL: Abdomen is soft, non-tender, not distended w/ normal bowel sounds.  GENITOURINARY: Bladder non tender, not distended  MUSCULOSKELETAL: No abnormal joints or musculature NEUROLOGIC: Cranial nerves 2-12 grossly intact. Moves all extremities PSYCHIATRIC: Mood and affect appropriate to situation, no behavioral issues  Patient Active Problem List   Diagnosis Date Noted  . Medicare annual wellness visit, subsequent 09/26/2019  . Depression, major, single episode, moderate ([place]) 08/09/2019  . Type 2 diabetes mellitus with hypertension and end stage renal disease on dialysis ([place])  07/31/2019  . Orthostatic hypotension 02/17/2019  . GERD without esophagitis 02/17/2019  . Dyslipidemia associated with type 2 diabetes mellitus ([place]) 02/17/2019  . Bilateral lower extremity edema 02/17/2019  . End stage renal disease on dialysis due to type 2 diabetes mellitus ([place]) 02/13/2019  . Hypertriglyceridemia 01/25/2019  . Gastroenteritis   . Hypothyroidism 01/19/2018  . Enteritis 01/19/2018  . S/P colectomy 08/31/2017  . Malignant neoplasm of colon ([place]) 06/25/2017  . Colitis 05/17/2017  . Dysplastic polyp of colon   . ESRD (end stage renal disease) ([place]) 05/01/2017  . Colonoscopy causing post-procedural bleeding   . Lower GI bleed   . Anemia in chronic kidney disease 08/23/2016  . Dependence on renal dialysis ([place]) 08/23/2016  . Type 2 diabetes mellitus with unspecified diabetic retinopathy without macular edema ([place]) 08/23/2016  . Hydronephrosis, bilateral 03/10/2016  . Essential (primary) hypertension 07/08/2015  . Hyperlipidemia 07/08/2015  . Obstructive and reflux uropathy, unspecified 04/17/2015  . Bladder neck contracture   . Benign prostatic hyperplasia with urinary retention 03/03/2015  . Intellectual disability 10/10/2011  . Acute pyelonephritis 10/07/2011  . Iron deficiency anemia 10/02/2011    CMP     Component Value Date/Time   NA 135 09/16/2019 0634   K 3.7 09/16/2019 0634   CL 95 (L) 09/16/2019 0634   CO2 25 09/16/2019 0634   GLUCOSE 165 (H) 09/16/2019 0634   BUN 40 (H) 09/16/2019 0634   CREATININE 10.47 (H) 09/16/2019 0634   CALCIUM 10.4 (H) 09/16/2019 0634   CALCIUM 8.3 (L) 03/03/2015 0622   PROT 9.3 (H) 09/16/2019 0634   ALBUMIN 4.6 09/16/2019 0634   AST 20 09/16/2019 0634   ALT 32 09/16/2019 0634   ALKPHOS 107 09/16/2019 0634   BILITOT 1.0 09/16/2019 0634   GFRNONAA 4 (L) 09/16/2019 0634   GFRAA 5 (L) 09/16/2019 0634   Recent Labs    01/25/19 0700 06/03/19 0325 09/16/19 0634  NA 137 137 135  K 4.7 4.6 3.7  CL 97* 102 95*  CO2 28  26 25   GLUCOSE 334* 160* 165*  BUN 25* 30* 40*  CREATININE 6.78* 8.46* 10.47*  CALCIUM 9.0 9.1 10.4*   Recent Labs    01/25/19 0700 06/03/19 0325 09/16/19 0634  AST 21 19 20   ALT 27 25 32  ALKPHOS 125 96 107  BILITOT 0.5 0.5 1.0  PROT 8.0 6.9 9.3*  ALBUMIN 4.0 3.6 4.6   Recent  Labs    01/25/19 0700 06/03/19 0325 09/16/19 0634  WBC 5.6 6.3 8.0  NEUTROABS  --   --  5.5  HGB 12.9* 10.8* 16.4  HCT 40.8 33.4* 50.6  MCV 110.3* 106.7* 107.2*  PLT 146* 132* 141*   Recent Labs    01/25/19 0700 06/03/19 0325 12/09/19 0744  CHOL 94 115 94  LDLCALC UNABLE TO CALCULATE IF TRIGLYCERIDE OVER 400 mg/dL UNABLE TO CALCULATE IF TRIGLYCERIDE OVER 400 mg/dL 4  TRIG 439* 531* 339*   No results found for: [facility] Lab Results  Component Value Date   TSH 3.202 12/09/2019   Lab Results  Component Value Date   HGBA1C 6.9 (H) 12/09/2019   Lab Results  Component Value Date   CHOL 94 12/09/2019   HDL 22 (L) 12/09/2019   LDLCALC 4 12/09/2019   LDLDIRECT 25.5 06/03/2019   TRIG 339 (H) 12/09/2019   CHOLHDL 4.3 12/09/2019    Significant Diagnostic Results in last 30 days:  No results found.  Assessment and Plan  Benign prostatic hyperplasia with urinary retention Chronic and stable; continue Flomax 0.4 mg daily  Bilateral lower extremity edema Chronic; continue Demadex 50 mg daily  Depression, major, single episode, moderate (HCC) Chronic and stable; continue Zoloft which has been decreased to 25 mg daily; will monitor progress    Hennie Duos, MD

## 2020-01-04 NOTE — Progress Notes (Signed)
Location:    [facility] Room Number: 112/D Place of Service:  SNF (31)   CODE STATUS: Full Code  Allergies  Allergen Reactions  . No Known Allergies     Chief Complaint  Patient presents with  . Medication Management    Medication Review     HPI:    Past Medical History:  Diagnosis Date  . Abnormal CT scan, kidney 10/06/2011  . Acute pyelonephritis 10/07/2011  . Anemia    normocytic  . Anxiety    mental retardation  . Bladder wall thickening 10/06/2011  . BPH (benign prostatic hypertrophy)   . Diabetes mellitus   . Dialysis patient [facility])    Tuesday, Thursday and Saturday,   . DVT of leg (deep venous thrombosis) ([place]) 12/25/2016  . Edema     history of lower extremity edema  . GERD (gastroesophageal reflux disease)   . Heme positive stool   . Hydronephrosis   . Hyperkalemia   . Hyperlipidemia   . Hypernatremia   . Hypertension   . Hypothyroidism   . Impaired speech   . 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 MR (mental retardation)   . Muscle weakness   . Obstructive uropathy   . 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 Poor historian poor historian  . Protein calorie malnutrition ([place])   . Pyelonephritis   . Renal failure (ARF), acute on chronic (HCC)   . Renal insufficiency    chronic history  . Sepsis ([place])   . Smoking   . Uremia   . Urinary retention   . UTI (lower urinary tract infection) 10/06/2011    Past Surgical History:  Procedure Laterality Date  . A/V FISTULAGRAM N/A 08/13/2018   Procedure: A/V FISTULAGRAM - Right Upper;  Surgeon: Elam Dutch, MD;  Location: [place] CV LAB;  Service: Cardiovascular;  Laterality: N/A;  . A/V FISTULAGRAM N/A 11/22/2018   Procedure: A/V FISTULAGRAM - Right Upper;  Surgeon: Waynetta Sandy, MD;  Location: [place] CV LAB;  Service: Cardiovascular;  Laterality: N/A;  . AV FISTULA PLACEMENT Left 07/06/2015   Procedure:  INSERTION LEFT ARM ARTERIOVENOUS GORTEX GRAFT;   Surgeon: Angelia Mould, MD;  Location: [place];  Service: Vascular;  Laterality: Left;  . AV FISTULA PLACEMENT Right 02/26/2016   Procedure: ARTERIOVENOUS (AV) FISTULA CREATION ;  Surgeon: Angelia Mould, MD;  Location: [place];  Service: Vascular;  Laterality: Right;  . AV FISTULA PLACEMENT Right 11/25/2018   Procedure: INSERTION OF ARTERIOVENOUS (AV) ARTEGRAFT RIGHT UPPER ARM;  Surgeon: Waynetta Sandy, MD;  Location: [place];  Service: Vascular;  Laterality: Right;  . [place] REMOVAL Left 10/09/2015   Procedure: REMOVAL OF ARTERIOVENOUS GORETEX GRAFT ([place]) Evacuation of Lymphocele, Vein Patch angioplasty of brachial artery.;  Surgeon: Angelia Mould, MD;  Location: [place];  Service: Vascular;  Laterality: Left;  . BASCILIC VEIN TRANSPOSITION Right 02/26/2016   Procedure: Right BASCILIC VEIN TRANSPOSITION;  Surgeon: Angelia Mould, MD;  Location: [place];  Service: Vascular;  Laterality: Right;  . CIRCUMCISION N/A 01/04/2014   Procedure: CIRCUMCISION ADULT (procedure #1);  Surgeon: Marissa Nestle, MD;  Location: [facility] ORS;  Service: Urology;  Laterality: N/A;  . COLECTOMY N/A 05/04/2017   Procedure: TOTAL COLECTOMY;  Surgeon: Aviva Signs, MD;  Location: [facility];  Service: General;  Laterality: N/A;  . COLONOSCOPY N/A 04/27/2017   Procedure: COLONOSCOPY;  Surgeon: Daneil Dolin, MD;  Location: [facility];  Service: Endoscopy;  Laterality: N/A;  245  . CYSTOSCOPY W/ RETROGRADES Bilateral 06/29/2015   Procedure: CYSTOSCOPY, DILATION OF URETHRAL STRICTURE WITH BILATERAL RETROGRADE PYELOGRAM,SUPRAPUBIC TUBE CHANGE;  Surgeon: Festus Aloe, MD;  Location: [facility];  Service: Urology;  Laterality: Bilateral;  . CYSTOSCOPY WITH URETHRAL DILATATION N/A 12/29/2013   Procedure: CYSTOSCOPY WITH URETHRAL DILATATION;  Surgeon: Marissa Nestle, MD;  Location: [facility];  Service: Urology;  Laterality: N/A;  . ESOPHAGOGASTRODUODENOSCOPY N/A 04/27/2017   Procedure: ESOPHAGOGASTRODUODENOSCOPY  (EGD);  Surgeon: Daneil Dolin, MD;  Location: [facility];  Service: Endoscopy;  Laterality: N/A;  . INSERTION OF DIALYSIS CATHETER Right 11/25/2018   Procedure: INSERTION OF DIALYSIS CATHETER RIGHT INTERNAL JUGULAR;  Surgeon: Waynetta Sandy, MD;  Location: [place];  Service: Vascular;  Laterality: Right;  . IR AV DIALY SHUNT INTRO [place] W/PTA/IMG RIGHT Right 09/07/2018  . IR REMOVAL TUN CV CATH W/O FL  01/12/2019  . IR THROMBECTOMY AV FISTULA W/THROMBOLYSIS/PTA INC/SHUNT/IMG RIGHT Right 04/26/2018  . IR US GUIDE VASC ACCESS RIGHT  04/26/2018  . IR US GUIDE VASC ACCESS RIGHT  09/07/2018  . ORIF FEMUR FRACTURE Right 11/22/2016   Procedure: OPEN REDUCTION INTERNAL FIXATION (ORIF) DISTAL FEMUR FRACTURE;  Surgeon: Rod Can, MD;  Location: [place];  Service: Orthopedics;  Laterality: Right;  . PATCH ANGIOPLASTY Right 12/10/2017   Procedure: PATCH ANGIOPLASTY;  Surgeon: Angelia Mould, MD;  Location: [facility] OR;  Service: Vascular;  Laterality: Right;  . PERIPHERAL VASCULAR BALLOON ANGIOPLASTY  08/13/2018   Procedure: PERIPHERAL VASCULAR BALLOON ANGIOPLASTY;  Surgeon: Elam Dutch, MD;  Location: [place] CV LAB;  Service: Cardiovascular;;  right AV fistula   . PERIPHERAL VASCULAR BALLOON ANGIOPLASTY  11/22/2018   Procedure: PERIPHERAL VASCULAR BALLOON ANGIOPLASTY;  Surgeon: Waynetta Sandy, MD;  Location: [place] CV LAB;  Service: Cardiovascular;;  rt AV fistula  . PERIPHERAL VASCULAR CATHETERIZATION N/A 10/08/2015   Procedure: A/V Shuntogram;  Surgeon: Angelia Mould, MD;  Location: [place] CV LAB;  Service: Cardiovascular;  Laterality: N/A;  . THROMBECTOMY W/ EMBOLECTOMY Right 12/10/2017   Procedure: THROMBECTOMY REVISION RIGHT ARM  ARTERIOVENOUS FISTULA;  Surgeon: Angelia Mould, MD;  Location: [place];  Service: Vascular;  Laterality: Right;  . TRANSURETHRAL RESECTION OF PROSTATE N/A 01/04/2014   Procedure: TRANSURETHRAL  RESECTION OF THE PROSTATE (TURP) (procedure #2);  Surgeon: Marissa Nestle, MD;  Location: [facility];  Service: Urology;  Laterality: N/A;    Social History   Socioeconomic History  . Marital status: Single    Spouse name: Not on file  . Number of children: Not on file  . Years of education: Not on file  . Highest education level: Not on file  Occupational History  . Occupation: retired   Tobacco Use  . Smoking status: Never Smoker  . Smokeless tobacco: Never Used  Substance and Sexual Activity  . Alcohol use: No  . Drug use: No  . Sexual activity: Not Currently  Other Topics Concern  . 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   Long term resident of [facility]    Social Determinants of Health   Financial Resource Strain:   . Difficulty of Paying Living Expenses: Not on file  Food Insecurity:   . Worried About Charity fundraiser in the Last Year: Not on file  . Ran Out of Food in the Last Year: Not on file  Transportation Needs:   . Lack of Transportation (  Medical): Not on file  . Lack of Transportation (Non-Medical): Not on file  Physical Activity: Unknown  . Days of Exercise per Week: Not on file  . Minutes of Exercise per Session: 0 min  Stress:   . Feeling of Stress : Not on file  Social Connections: Unknown  . Frequency of Communication with Friends and Family: Not on file  . Frequency of Social Gatherings with Friends and Family: Never  . Attends Religious Services: Not on file  . Active Member of Clubs or Organizations: Not on file  . Attends Archivist Meetings: Not on file  . Marital Status: Not on file  Intimate Partner Violence:   . Fear of Current or Ex-Partner: Not on file  . Emotionally Abused: Not on file  . Physically Abused: Not on file  . Sexually Abused: Not on file   Family History  Problem Relation Age of Onset  . Cancer Mother   . Colon cancer Neg Hx        VITAL SIGNS BP (!) 88/55   Pulse 87   Temp (!) 97.1 F (36.2 C) (Oral)   Resp 18   Ht 5\' 8"  (1.727 m)   Wt 213 lb (96.6 kg)   SpO2 95%   BMI 32.39 kg/m   Outpatient Encounter Medications as of 01/04/2020  Medication Sig  . atorvastatin (LIPITOR) 80 MG tablet Take 80 mg by mouth every evening.  . insulin aspart (NOVOLOG FLEXPEN) 100 UNIT/ML FlexPen Inject 8 Units into the skin 3 (three) times daily with meals. Special Instructions: on dialysis days Monday,wednesday and Friday. 8 units if cbg above 150 on non dialysis days  . Insulin Glargine (BASAGLAR KWIKPEN) 100 UNIT/ML SOPN Inject 30 Units into the skin at bedtime.   Marland Kitchen JANUVIA 25 MG tablet Take 25 mg by mouth daily.   Marland Kitchen levothyroxine (SYNTHROID, LEVOTHROID) 88 MCG tablet Take 88 mcg by mouth daily before breakfast.  . midodrine (PROAMATINE) 10 MG tablet Take 10 mg by mouth. Send med with resident on Mon/Wed/Fri to dialysis. [facility] to take with him. send with 5mg  to equal 15mg .  . midodrine (PROAMATINE) 5 MG tablet Take 5 mg by mouth. Send with resident on dialysis days.  Mon, Wed, Fri.  Take along with 10 mg to equal 15 mg  . NON FORMULARY Fluid Restriction - 1200 cc a day 500 cc for days, Evenings 500 cc, Night 200 cc  . NON FORMULARY Diet Type:  NAS, Cons CHO Diet  . Nutritional Supplements (NEPRO/CARBSTEADY PO) Take 1 Can by mouth 2 (two) times daily.  Marland Kitchen omega-3 acid ethyl esters (LOVAZA) 1 g capsule 1 gram; amt: 2 capsules; oral  Special Instructions: elevated triglycerides At Bedtime 09:00 PM  . omeprazole (PRILOSEC) 40 MG capsule Take 40 mg by mouth at bedtime.   . polyethylene glycol (MIRALAX / GLYCOLAX) packet Take 17 g by mouth as needed for mild constipation.   . sertraline (ZOLOFT) 50 MG tablet Take 50 mg by mouth daily.  . sevelamer carbonate (RENVELA) 800 MG tablet Take 1,600 mg by mouth 3 (three) times daily.   . sevelamer carbonate (RENVELA) 800 MG tablet Take 800 mg by  mouth daily. With snacks  . tamsulosin (FLOMAX) 0.4 MG CAPS capsule Take 0.4 mg by mouth every evening. Give 30 minutes after a meal  . torsemide (DEMADEX) 10 MG tablet Take 5 tablets (50 mg total) by mouth daily.   No facility-administered encounter medications on  file as of 01/04/2020.     SIGNIFICANT DIAGNOSTIC EXAMS       ASSESSMENT/ PLAN:    MD is aware of resident's narcotic use and is in agreement with current plan of care. We will attempt to wean resident as appropriate.  Ok Edwards NP [facility]  Contact [phone] Monday through Friday 8am- 5pm  After hours call [phone]   This encounter was created in error - please disregard.

## 2020-01-05 ENCOUNTER — Encounter: Payer: Self-pay | Admitting: Adult Health

## 2020-01-05 ENCOUNTER — Non-Acute Institutional Stay (SKILLED_NURSING_FACILITY): Payer: Medicare Other | Admitting: Adult Health

## 2020-01-05 DIAGNOSIS — Z9181 History of falling: Secondary | ICD-10-CM | POA: Diagnosis not present

## 2020-01-05 DIAGNOSIS — K219 Gastro-esophageal reflux disease without esophagitis: Secondary | ICD-10-CM

## 2020-01-05 DIAGNOSIS — I120 Hypertensive chronic kidney disease with stage 5 chronic kidney disease or end stage renal disease: Secondary | ICD-10-CM | POA: Diagnosis not present

## 2020-01-05 DIAGNOSIS — E1129 Type 2 diabetes mellitus with other diabetic kidney complication: Secondary | ICD-10-CM | POA: Diagnosis not present

## 2020-01-05 DIAGNOSIS — Z992 Dependence on renal dialysis: Secondary | ICD-10-CM

## 2020-01-05 DIAGNOSIS — N186 End stage renal disease: Secondary | ICD-10-CM | POA: Diagnosis not present

## 2020-01-05 DIAGNOSIS — M6281 Muscle weakness (generalized): Secondary | ICD-10-CM | POA: Diagnosis not present

## 2020-01-05 DIAGNOSIS — R262 Difficulty in walking, not elsewhere classified: Secondary | ICD-10-CM | POA: Diagnosis not present

## 2020-01-05 DIAGNOSIS — E1122 Type 2 diabetes mellitus with diabetic chronic kidney disease: Secondary | ICD-10-CM

## 2020-01-05 DIAGNOSIS — Z1159 Encounter for screening for other viral diseases: Secondary | ICD-10-CM | POA: Diagnosis not present

## 2020-01-05 NOTE — Progress Notes (Signed)
Location:    [place] Room Number: 112/D Place of Service:  SNF (31)   CODE STATUS: Full Code  Allergies  Allergen Reactions  . No Known Allergies     Chief Complaint  Patient presents with  . Acute Visit    Care Plan Meeting    HPI:  We have come together for his care plan meeting. BIMS 8/15 mood 0/30. There are no reports of falls. His weight is stable; there are no reports of changes in appetite. No reports of anxiety or agitation. He continues to be followed for his chronic illnesses including: diabetes; esrd; gerd.   Past Medical History:  Diagnosis Date  . Abnormal CT scan, kidney 10/06/2011  . Acute pyelonephritis 10/07/2011  . Anemia    normocytic  . Anxiety    mental retardation  . Bladder wall thickening 10/06/2011  . BPH (benign prostatic hypertrophy)   . Diabetes mellitus   . Dialysis patient [facility])    Tuesday, Thursday and Saturday,   . DVT of leg (deep venous thrombosis) ([place]) 12/25/2016  . Edema     history of lower extremity edema  . GERD (gastroesophageal reflux disease)   . Heme positive stool   . Hydronephrosis   . Hyperkalemia   . Hyperlipidemia   . Hypernatremia   . Hypertension   . Hypothyroidism   . Impaired speech   . 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 MR (mental retardation)   . Muscle weakness   . Obstructive uropathy   . 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 Poor historian poor historian  . Protein calorie malnutrition ([place])   . Pyelonephritis   . Renal failure (ARF), acute on chronic (HCC)   . Renal insufficiency    chronic history  . Sepsis ([place])   . Smoking   . Uremia   . Urinary retention   . UTI (lower urinary tract infection) 10/06/2011    Past Surgical History:  Procedure Laterality Date  . A/V FISTULAGRAM N/A 08/13/2018   Procedure: A/V FISTULAGRAM - Right Upper;  Surgeon: Elam Dutch, MD;  Location: [place] CV LAB;  Service: Cardiovascular;  Laterality: N/A;  . A/V  FISTULAGRAM N/A 11/22/2018   Procedure: A/V FISTULAGRAM - Right Upper;  Surgeon: Waynetta Sandy, MD;  Location: [place] CV LAB;  Service: Cardiovascular;  Laterality: N/A;  . AV FISTULA PLACEMENT Left 07/06/2015   Procedure:  INSERTION LEFT ARM ARTERIOVENOUS GORTEX GRAFT;  Surgeon: Angelia Mould, MD;  Location: [place];  Service: Vascular;  Laterality: Left;  . AV FISTULA PLACEMENT Right 02/26/2016   Procedure: ARTERIOVENOUS (AV) FISTULA CREATION ;  Surgeon: Angelia Mould, MD;  Location: [place];  Service: Vascular;  Laterality: Right;  . AV FISTULA PLACEMENT Right 11/25/2018   Procedure: INSERTION OF ARTERIOVENOUS (AV) ARTEGRAFT RIGHT UPPER ARM;  Surgeon: Waynetta Sandy, MD;  Location: [place];  Service: Vascular;  Laterality: Right;  . [place] REMOVAL Left 10/09/2015   Procedure: REMOVAL OF ARTERIOVENOUS GORETEX GRAFT ([place]) Evacuation of Lymphocele, Vein Patch angioplasty of brachial artery.;  Surgeon: Angelia Mould, MD;  Location: [place];  Service: Vascular;  Laterality: Left;  . BASCILIC VEIN TRANSPOSITION Right 02/26/2016   Procedure: Right BASCILIC VEIN TRANSPOSITION;  Surgeon: Angelia Mould, MD;  Location: [place];  Service: Vascular;  Laterality: Right;  . CIRCUMCISION N/A 01/04/2014   Procedure: CIRCUMCISION ADULT (procedure #1);  Surgeon: Marissa Nestle, MD;  Location: [facility];  Service: Urology;  Laterality:  N/A;  . COLECTOMY N/A 05/04/2017   Procedure: TOTAL COLECTOMY;  Surgeon: Aviva Signs, MD;  Location: [facility];  Service: General;  Laterality: N/A;  . COLONOSCOPY N/A 04/27/2017   Procedure: COLONOSCOPY;  Surgeon: Daneil Dolin, MD;  Location: [facility];  Service: Endoscopy;  Laterality: N/A;  245  . CYSTOSCOPY W/ RETROGRADES Bilateral 06/29/2015   Procedure: CYSTOSCOPY, DILATION OF URETHRAL STRICTURE WITH BILATERAL RETROGRADE PYELOGRAM,SUPRAPUBIC TUBE CHANGE;  Surgeon: Festus Aloe, MD;  Location: [facility];  Service: Urology;  Laterality:  Bilateral;  . CYSTOSCOPY WITH URETHRAL DILATATION N/A 12/29/2013   Procedure: CYSTOSCOPY WITH URETHRAL DILATATION;  Surgeon: Marissa Nestle, MD;  Location: [facility];  Service: Urology;  Laterality: N/A;  . ESOPHAGOGASTRODUODENOSCOPY N/A 04/27/2017   Procedure: ESOPHAGOGASTRODUODENOSCOPY (EGD);  Surgeon: Daneil Dolin, MD;  Location: [facility];  Service: Endoscopy;  Laterality: N/A;  . INSERTION OF DIALYSIS CATHETER Right 11/25/2018   Procedure: INSERTION OF DIALYSIS CATHETER RIGHT INTERNAL JUGULAR;  Surgeon: Waynetta Sandy, MD;  Location: [place];  Service: Vascular;  Laterality: Right;  . IR AV DIALY SHUNT INTRO [place] W/PTA/IMG RIGHT Right 09/07/2018  . IR REMOVAL TUN CV CATH W/O FL  01/12/2019  . IR THROMBECTOMY AV FISTULA W/THROMBOLYSIS/PTA INC/SHUNT/IMG RIGHT Right 04/26/2018  . IR US GUIDE VASC ACCESS RIGHT  04/26/2018  . IR US GUIDE VASC ACCESS RIGHT  09/07/2018  . ORIF FEMUR FRACTURE Right 11/22/2016   Procedure: OPEN REDUCTION INTERNAL FIXATION (ORIF) DISTAL FEMUR FRACTURE;  Surgeon: Rod Can, MD;  Location: [place];  Service: Orthopedics;  Laterality: Right;  . PATCH ANGIOPLASTY Right 12/10/2017   Procedure: PATCH ANGIOPLASTY;  Surgeon: Angelia Mould, MD;  Location: [facility] OR;  Service: Vascular;  Laterality: Right;  . PERIPHERAL VASCULAR BALLOON ANGIOPLASTY  08/13/2018   Procedure: PERIPHERAL VASCULAR BALLOON ANGIOPLASTY;  Surgeon: Elam Dutch, MD;  Location: [place] CV LAB;  Service: Cardiovascular;;  right AV fistula   . PERIPHERAL VASCULAR BALLOON ANGIOPLASTY  11/22/2018   Procedure: PERIPHERAL VASCULAR BALLOON ANGIOPLASTY;  Surgeon: Waynetta Sandy, MD;  Location: [place] CV LAB;  Service: Cardiovascular;;  rt AV fistula  . PERIPHERAL VASCULAR CATHETERIZATION N/A 10/08/2015   Procedure: A/V Shuntogram;  Surgeon: Angelia Mould, MD;  Location: [place] CV LAB;  Service: Cardiovascular;  Laterality: N/A;  .  THROMBECTOMY W/ EMBOLECTOMY Right 12/10/2017   Procedure: THROMBECTOMY REVISION RIGHT ARM  ARTERIOVENOUS FISTULA;  Surgeon: Angelia Mould, MD;  Location: [place];  Service: Vascular;  Laterality: Right;  . TRANSURETHRAL RESECTION OF PROSTATE N/A 01/04/2014   Procedure: TRANSURETHRAL RESECTION OF THE PROSTATE (TURP) (procedure #2);  Surgeon: Marissa Nestle, MD;  Location: [facility];  Service: Urology;  Laterality: N/A;    Social History   Socioeconomic History  . Marital status: Single    Spouse name: Not on file  . Number of children: Not on file  . Years of education: Not on file  . Highest education level: Not on file  Occupational History  . Occupation: retired   Tobacco Use  . Smoking status: Never Smoker  . Smokeless tobacco: Never Used  Substance and Sexual Activity  . Alcohol use: No  . Drug use: No  . Sexual activity: Not Currently  Other Topics Concern  . 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   Long term resident of [facility]    Social Determinants of Health   Financial Resource Strain:   .  Difficulty of Paying Living Expenses:   Food Insecurity:   . Worried About Charity fundraiser in the Last Year:   . Arboriculturist in the Last Year:   Transportation Needs:   . Film/video editor (Medical):   Marland Kitchen Lack of Transportation (Non-Medical):   Physical Activity: Unknown  . Days of Exercise per Week: Not on file  . Minutes of Exercise per Session: 0 min  Stress:   . Feeling of Stress :   Social Connections: Unknown  . Frequency of Communication with Friends and Family: Not on file  . Frequency of Social Gatherings with Friends and Family: Never  . Attends Religious Services: Not on file  . Active Member of Clubs or Organizations: Not on file  . Attends Archivist Meetings: Not on file  . Marital Status: Not on file  Intimate Partner Violence:   . Fear of Current or  Ex-Partner:   . Emotionally Abused:   Marland Kitchen Physically Abused:   . Sexually Abused:    Family History  Problem Relation Age of Onset  . Cancer Mother   . Colon cancer Neg Hx       VITAL SIGNS BP (!) 88/55   Pulse 87   Temp 97.9 F (36.6 C) (Oral)   Resp 18   Ht 5\' 8"  (1.727 m)   Wt 213 lb (96.6 kg)   SpO2 95%   BMI 32.39 kg/m   Outpatient Encounter Medications as of 01/05/2020  Medication Sig  . atorvastatin (LIPITOR) 80 MG tablet Take 80 mg by mouth every evening.  . insulin aspart (NOVOLOG FLEXPEN) 100 UNIT/ML FlexPen Inject 8 Units into the skin 3 (three) times daily with meals. Special Instructions: on dialysis days Monday,wednesday and Friday. 8 units if cbg above 150 on non dialysis days  . Insulin Glargine (BASAGLAR KWIKPEN) 100 UNIT/ML SOPN Inject 30 Units into the skin at bedtime.   Marland Kitchen JANUVIA 25 MG tablet Take 25 mg by mouth daily.   Marland Kitchen levothyroxine (SYNTHROID, LEVOTHROID) 88 MCG tablet Take 88 mcg by mouth daily before breakfast.  . midodrine (PROAMATINE) 10 MG tablet Take 10 mg by mouth. Send med with resident on Mon/Wed/Fri to dialysis. [facility] give to resident to take with him. send with 5mg  to equal 15mg .  . midodrine (PROAMATINE) 5 MG tablet Take 5 mg by mouth. Send with resident on dialysis days.  Mon, Wed, Fri.  Take along with 10 mg to equal 15 mg  . NON FORMULARY Fluid Restriction - 1200 cc a day 500 cc for days, Evenings 500 cc, Night 200 cc  . NON FORMULARY Diet Type:  NAS, Cons CHO Diet  . Nutritional Supplements (NEPRO/CARBSTEADY PO) Take 1 Can by mouth 2 (two) times daily.  Marland Kitchen omega-3 acid ethyl esters (LOVAZA) 1 g capsule 1 gram; amt: 2 capsules; oral  Special Instructions: elevated triglycerides At Bedtime 09:00 PM  . omeprazole (PRILOSEC) 40 MG capsule Take 40 mg by mouth at bedtime.   . polyethylene glycol (MIRALAX / GLYCOLAX) packet Take 17 g by mouth as needed for mild constipation.   . sertraline (ZOLOFT) 50 MG tablet Take 25 mg  by mouth daily.   . sevelamer carbonate (RENVELA) 800 MG tablet Take 1,600 mg by mouth 3 (three) times daily.   . sevelamer carbonate (RENVELA) 800 MG tablet Take 800 mg by mouth daily. With snacks  . tamsulosin (FLOMAX) 0.4 MG CAPS capsule Take 0.4 mg by mouth every evening.  Give 30 minutes after a meal  . torsemide (DEMADEX) 10 MG tablet Take 5 tablets (50 mg total) by mouth daily.   No facility-administered encounter medications on file as of 01/05/2020.     SIGNIFICANT DIAGNOSTIC EXAMS   LABS REVIEWED PREVIOUS;  01-25-19: wbc 5.6; hgb 12.9 hct 40.8; mcv 110.3; plt 146; glucose 334; bun 25; creat 6.78; k+ 4.7; na++ 137; ca 9.0 liver normal albumin 4.0  Hgb a1c 6.9; chol 94 trig 439 LDL direct 16.3 05-10-19 hgb a1c 7.6   06-03-19: wbc 6.3; hgb 10.8; hct 33.4; mcv 106.7; plt 132; glucose 160; bun 30; creat 8.46; k+ 4.6; na++ 137; ca 9.1; liver normal albumin 3.6; chol 115; LDL 25.5; trig 531; hdl 24; PSA 2.73 09-07-19: hgb a1c 7.5 09-16-19: wbc 8.0; hgb 16.4; hct 50.6; mcv 107.2 plt 141; glucose 165; bun 40; creat 10.47; k+ 3.7; na++ 135; ca 9.3; liver normal albumin 4.6; stool guaiac neg   NO NEW LABS.   Review of Systems  Constitutional: Negative for malaise/fatigue.  Respiratory: Negative for cough and shortness of breath.   Cardiovascular: Negative for chest pain, palpitations and leg swelling.  Gastrointestinal: Negative for abdominal pain, constipation and heartburn.  Musculoskeletal: Negative for back pain, joint pain and myalgias.  Skin: Negative.   Neurological: Negative for dizziness.  Psychiatric/Behavioral: The patient is not nervous/anxious.     Physical Exam Constitutional:      General: He is not in acute distress.    Appearance: He is well-developed. He is not diaphoretic.  Neck:     Thyroid: No thyromegaly.  Cardiovascular:     Rate and Rhythm: Normal rate and regular rhythm.     Pulses: Normal pulses.     Heart sounds: Normal heart sounds.  Pulmonary:      Effort: Pulmonary effort is normal. No respiratory distress.     Breath sounds: Normal breath sounds.  Abdominal:     General: Bowel sounds are normal. There is no distension.     Palpations: Abdomen is soft.     Tenderness: There is no abdominal tenderness.  Genitourinary:    Comments: history of turp Musculoskeletal:     Cervical back: Neck supple.     Right lower leg: No edema.     Left lower leg: No edema.     Comments:  Is able to move all extremities History of right femur ORIF       Lymphadenopathy:     Cervical: No cervical adenopathy.  Skin:    General: Skin is warm and dry.     Comments: Right upper extremity a/v fistula + thrill + bruit    Neurological:     Mental Status: He is alert. Mental status is at baseline.  Psychiatric:        Mood and Affect: Mood normal.       ASSESSMENT/ PLAN:  TODAY  1.  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 Dependent on hemodialysis  3. GERD without esophagitis 4.  Type 2 diabetes mellitus with end stage renal disease and hypertension.   Will continue current medications; Will continue current plan of care Will continue to monitor his status.   MD is aware of resident's narcotic use and is in agreement with current plan of care. We will attempt to wean resident as appropriate.  Ok Edwards NP [facility]  Contact [phone] Monday through Friday 8am- 5pm  After hours call [phone]

## 2020-01-06 DIAGNOSIS — D509 Iron deficiency anemia, unspecified: Secondary | ICD-10-CM | POA: Diagnosis not present

## 2020-01-06 DIAGNOSIS — N2581 Secondary hyperparathyroidism of renal origin: Secondary | ICD-10-CM | POA: Diagnosis not present

## 2020-01-06 DIAGNOSIS — Z992 Dependence on renal dialysis: Secondary | ICD-10-CM | POA: Diagnosis not present

## 2020-01-06 DIAGNOSIS — M6281 Muscle weakness (generalized): Secondary | ICD-10-CM | POA: Diagnosis not present

## 2020-01-06 DIAGNOSIS — N186 End stage renal disease: Secondary | ICD-10-CM | POA: Diagnosis not present

## 2020-01-06 DIAGNOSIS — Z9181 History of falling: Secondary | ICD-10-CM | POA: Diagnosis not present

## 2020-01-06 DIAGNOSIS — D631 Anemia in chronic kidney disease: Secondary | ICD-10-CM | POA: Diagnosis not present

## 2020-01-06 DIAGNOSIS — E1129 Type 2 diabetes mellitus with other diabetic kidney complication: Secondary | ICD-10-CM | POA: Diagnosis not present

## 2020-01-06 DIAGNOSIS — R262 Difficulty in walking, not elsewhere classified: Secondary | ICD-10-CM | POA: Diagnosis not present

## 2020-01-08 ENCOUNTER — Encounter: Payer: Self-pay | Admitting: Internal Medicine

## 2020-01-08 NOTE — Assessment & Plan Note (Signed)
Chronic; continue Demadex 50 mg daily

## 2020-01-08 NOTE — Assessment & Plan Note (Signed)
Chronic and stable; continue Zoloft which has been decreased to 25 mg daily; will monitor progress

## 2020-01-08 NOTE — Assessment & Plan Note (Signed)
Chronic and stable; continue Flomax 0.4 mg daily

## 2020-01-09 ENCOUNTER — Non-Acute Institutional Stay (SKILLED_NURSING_FACILITY): Payer: Medicare Other | Admitting: Adult Health

## 2020-01-09 ENCOUNTER — Encounter: Payer: Self-pay | Admitting: Adult Health

## 2020-01-09 DIAGNOSIS — D509 Iron deficiency anemia, unspecified: Secondary | ICD-10-CM | POA: Diagnosis not present

## 2020-01-09 DIAGNOSIS — Z0000 Encounter for general adult medical examination without abnormal findings: Secondary | ICD-10-CM | POA: Diagnosis not present

## 2020-01-09 DIAGNOSIS — N186 End stage renal disease: Secondary | ICD-10-CM | POA: Diagnosis not present

## 2020-01-09 DIAGNOSIS — Z992 Dependence on renal dialysis: Secondary | ICD-10-CM | POA: Diagnosis not present

## 2020-01-09 DIAGNOSIS — N2581 Secondary hyperparathyroidism of renal origin: Secondary | ICD-10-CM | POA: Diagnosis not present

## 2020-01-09 DIAGNOSIS — D631 Anemia in chronic kidney disease: Secondary | ICD-10-CM | POA: Diagnosis not present

## 2020-01-09 NOTE — Progress Notes (Signed)
Subjective:   Zachary [name] is a 74 y.o. male who presents for Medicare Annual/Subsequent preventive examination.  Review of Systems:  Review of Systems  Constitutional: Negative for malaise/fatigue.  Respiratory: Negative for cough and shortness of breath.   Cardiovascular: Negative for chest pain, palpitations and leg swelling.  Gastrointestinal: Negative for abdominal pain, constipation and heartburn.  Musculoskeletal: Negative for back pain, joint pain and myalgias.  Skin: Negative.   Neurological: Negative for dizziness.  Psychiatric/Behavioral: The patient is not nervous/anxious.     Cardiac Risk Factors include: advanced age (>29men, >48 women);diabetes mellitus;dyslipidemia;male gender;hypertension;obesity (BMI >30kg/m2)     Objective:    Vitals: BP (!) 96/58   Pulse 68   Temp 98.6 F (37 C) (Oral)   Resp 20   Ht 5\' 8"  (1.727 m)   Wt 213 lb (96.6 kg)   SpO2 100%   BMI 32.39 kg/m   Body mass index is 32.39 kg/m.  Advanced Directives 01/05/2020 01/04/2020 12/02/2019 11/21/2019 11/01/2019 10/27/2019 10/19/2019  Does Patient Have a Medical Advance Directive? Yes Yes Yes Yes Yes Yes Yes  Type of Advance Directive (No Data) (No Data) (No Data) (No Data) (No Data) (No Data) (No Data)  Does patient want to make changes to medical advance directive? No - Patient declined No - Patient declined No - Patient declined No - Patient declined No - Patient declined No - Patient declined No - Patient declined  Copy of [place] in [place]  Would patient like information on creating a medical advance directive? - - - - - - -  Pre-existing out of facility DNR order (yellow form or pink MOST form) - - - - - - -    Tobacco Social History   Tobacco Use  Smoking Status Never Smoker  Smokeless Tobacco Never Used     Counseling given: Not Answered   Clinical Intake:     Pain : No/denies pain     Nutritional Status: BMI > 30  Obese Diabetes:  Yes CBG done?: Yes CBG resulted in Enter/ Edit results?: Yes Did pt. bring in CBG monitor from home?: Yes(patient long term resident of SNF) Glucose Meter Downloaded?: No  How often do you need to have someone help you when you read instructions, pamphlets, or other written materials from your doctor or pharmacy?: 5 - Always  Interpreter Needed?: No  Comments: patient resident of SNF; has been declared incompentent  Past Medical History:  Diagnosis Date  . Abnormal CT scan, kidney 10/06/2011  . Acute pyelonephritis 10/07/2011  . Anemia    normocytic  . Anxiety    mental retardation  . Bladder wall thickening 10/06/2011  . BPH (benign prostatic hypertrophy)   . Diabetes mellitus   . Dialysis patient [facility])    Tuesday, Thursday and Saturday,   . DVT of leg (deep venous thrombosis) ([place]) 12/25/2016  . Edema     history of lower extremity edema  . GERD (gastroesophageal reflux disease)   . Heme positive stool   . Hydronephrosis   . Hyperkalemia   . Hyperlipidemia   . Hypernatremia   . Hypertension   . Hypothyroidism   . Impaired speech   . 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 MR (mental retardation)   . Muscle weakness   . Obstructive uropathy   . 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 Poor historian poor historian  . Protein calorie malnutrition ([place])   . Pyelonephritis   .  Renal failure (ARF), acute on chronic (HCC)   . Renal insufficiency    chronic history  . Sepsis ([place])   . Smoking   . Uremia   . Urinary retention   . UTI (lower urinary tract infection) 10/06/2011   Past Surgical History:  Procedure Laterality Date  . A/V FISTULAGRAM N/A 08/13/2018   Procedure: A/V FISTULAGRAM - Right Upper;  Surgeon: Elam Dutch, MD;  Location: [place] [facility];  Service: Cardiovascular;  Laterality: N/A;  . A/V FISTULAGRAM N/A 11/22/2018   Procedure: A/V FISTULAGRAM - Right Upper;  Surgeon: Waynetta Sandy, MD;  Location: [place] [facility];  Service:  Cardiovascular;  Laterality: N/A;  . AV FISTULA PLACEMENT Left 07/06/2015   Procedure:  INSERTION LEFT ARM ARTERIOVENOUS GORTEX GRAFT;  Surgeon: Angelia Mould, MD;  Location: [place];  Service: Vascular;  Laterality: Left;  . AV FISTULA PLACEMENT Right 02/26/2016   Procedure: ARTERIOVENOUS (AV) FISTULA CREATION ;  Surgeon: Angelia Mould, MD;  Location: [place];  Service: Vascular;  Laterality: Right;  . AV FISTULA PLACEMENT Right 11/25/2018   Procedure: INSERTION OF ARTERIOVENOUS (AV) ARTEGRAFT RIGHT UPPER ARM;  Surgeon: Waynetta Sandy, MD;  Location: [place];  Service: Vascular;  Laterality: Right;  . [place] REMOVAL Left 10/09/2015   Procedure: REMOVAL OF ARTERIOVENOUS GORETEX GRAFT ([place]) Evacuation of Lymphocele, Vein Patch angioplasty of brachial artery.;  Surgeon: Angelia Mould, MD;  Location: [place];  Service: Vascular;  Laterality: Left;  . BASCILIC VEIN TRANSPOSITION Right 02/26/2016   Procedure: Right BASCILIC VEIN TRANSPOSITION;  Surgeon: Angelia Mould, MD;  Location: [place];  Service: Vascular;  Laterality: Right;  . CIRCUMCISION N/A 01/04/2014   Procedure: CIRCUMCISION ADULT (procedure #1);  Surgeon: Marissa Nestle, MD;  [facility];  Service: Urology;  Laterality: N/A;  . COLECTOMY N/A 05/04/2017   Procedure: TOTAL COLECTOMY;  Surgeon: Aviva Signs, MD;  [facility];  Service: General;  Laterality: N/A;  . COLONOSCOPY N/A 04/27/2017   Procedure: COLONOSCOPY;  Surgeon: Daneil Dolin, MD;  [facility];  Service: Endoscopy;  Laterality: N/A;  245  . CYSTOSCOPY W/ RETROGRADES Bilateral 06/29/2015   Procedure: CYSTOSCOPY, DILATION OF URETHRAL STRICTURE WITH BILATERAL RETROGRADE PYELOGRAM,SUPRAPUBIC TUBE CHANGE;  Surgeon: Festus Aloe, MD;  [facility];  Service: Urology;  Laterality: Bilateral;  . CYSTOSCOPY WITH URETHRAL DILATATION N/A 12/29/2013   Procedure: CYSTOSCOPY WITH URETHRAL DILATATION;  Surgeon: Marissa Nestle, MD;   [facility];  Service: Urology;  Laterality: N/A;  . ESOPHAGOGASTRODUODENOSCOPY N/A 04/27/2017   Procedure: ESOPHAGOGASTRODUODENOSCOPY (EGD);  Surgeon: Daneil Dolin, MD;  [facility];  Service: Endoscopy;  Laterality: N/A;  . INSERTION OF DIALYSIS CATHETER Right 11/25/2018   Procedure: INSERTION OF DIALYSIS CATHETER RIGHT INTERNAL JUGULAR;  Surgeon: Waynetta Sandy, MD;  Location: [place];  Service: Vascular;  Laterality: Right;  . IR AV DIALY SHUNT INTRO [place] W/PTA/IMG RIGHT Right 09/07/2018  . IR REMOVAL TUN CV CATH W/O FL  01/12/2019  . IR THROMBECTOMY AV FISTULA W/THROMBOLYSIS/PTA INC/SHUNT/IMG RIGHT Right 04/26/2018  . IR US GUIDE VASC ACCESS RIGHT  04/26/2018  . IR US GUIDE VASC ACCESS RIGHT  09/07/2018  . ORIF FEMUR FRACTURE Right 11/22/2016   Procedure: OPEN REDUCTION INTERNAL FIXATION (ORIF) DISTAL FEMUR FRACTURE;  Surgeon: Rod Can, MD;  Location: [place];  Service: Orthopedics;  Laterality: Right;  . PATCH ANGIOPLASTY Right 12/10/2017   Procedure: PATCH ANGIOPLASTY;  Surgeon: Angelia Mould, MD;  [facility];  Service: Vascular;  Laterality: Right;  . PERIPHERAL VASCULAR BALLOON ANGIOPLASTY  08/13/2018   Procedure: PERIPHERAL VASCULAR BALLOON ANGIOPLASTY;  Surgeon: Elam Dutch, MD;  Location: [place] [facility];  Service: Cardiovascular;;  right AV fistula   . PERIPHERAL VASCULAR BALLOON ANGIOPLASTY  11/22/2018   Procedure: PERIPHERAL VASCULAR BALLOON ANGIOPLASTY;  Surgeon: Waynetta Sandy, MD;  Location: [place] [facility];  Service: Cardiovascular;;  rt AV fistula  . PERIPHERAL VASCULAR CATHETERIZATION N/A 10/08/2015   Procedure: A/V Shuntogram;  Surgeon: Angelia Mould, MD;  Location: [place] [facility];  Service: Cardiovascular;  Laterality: N/A;  . THROMBECTOMY W/ EMBOLECTOMY Right 12/10/2017   Procedure: THROMBECTOMY REVISION RIGHT ARM  ARTERIOVENOUS FISTULA;  Surgeon: Angelia Mould, MD;   Location: [place];  Service: Vascular;  Laterality: Right;  . TRANSURETHRAL RESECTION OF PROSTATE N/A 01/04/2014   Procedure: TRANSURETHRAL RESECTION OF THE PROSTATE (TURP) (procedure #2);  Surgeon: Marissa Nestle, MD;  [facility];  Service: Urology;  Laterality: N/A;   Family History  Problem Relation Age of Onset  . Cancer Mother   . Colon cancer Neg Hx    Social History   Socioeconomic History  . Marital status: Single    Spouse name: Not on file  . Number of children: Not on file  . Years of education: Not on file  . Highest education level: Not on file  Occupational History  . Occupation: retired   Tobacco Use  . Smoking status: Never Smoker  . Smokeless tobacco: Never Used  Substance and Sexual Activity  . Alcohol use: No  . Drug use: No  . Sexual activity: Not Currently  Other Topics Concern  . 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   Long term resident of [facility]    Social Determinants of Health   Financial Resource Strain:   . Difficulty of Paying Living Expenses:   Food Insecurity:   . Worried About Charity fundraiser in the Last Year:   . Arboriculturist in the Last Year:   Transportation Needs:   . Film/video editor (Medical):   Marland Kitchen Lack of Transportation (Non-Medical):   Physical Activity: Unknown  . Days of Exercise per Week: Not on file  . Minutes of Exercise per Session: 0 min  Stress:   . Feeling of Stress :   Social Connections: Unknown  . Frequency of Communication with Friends and Family: Not on file  . Frequency of Social Gatherings with Friends and Family: Never  . Attends Religious Services: Not on file  . Active Member of Clubs or Organizations: Not on file  . Attends Archivist Meetings: Not on file  . Marital Status: Not on file    Outpatient Encounter Medications as of 01/09/2020  Medication Sig  . atorvastatin (LIPITOR) 80 MG tablet Take 80  mg by mouth every evening.  . insulin aspart (NOVOLOG FLEXPEN) 100 UNIT/ML FlexPen Inject 8 Units into the skin 3 (three) times daily with meals. Special Instructions: on dialysis days Monday,wednesday and Friday. 8 units if cbg above 150 on non dialysis days  . Insulin Glargine (BASAGLAR KWIKPEN) 100 UNIT/ML SOPN Inject 30 Units into the skin at bedtime.   Marland Kitchen JANUVIA 25 MG tablet Take 25 mg by mouth daily.   Marland Kitchen levothyroxine (SYNTHROID, LEVOTHROID) 88 MCG tablet Take 88 mcg by mouth daily before breakfast.  . midodrine (PROAMATINE) 10 MG  tablet Take 10 mg by mouth. Send med with resident on Mon/Wed/Fri to dialysis. do not give at [facility] give to resident to take with him. send with 5mg  to equal 15mg .  . midodrine (PROAMATINE) 5 MG tablet Take 5 mg by mouth. Send with resident on dialysis days.  Mon, Wed, Fri.  Take along with 10 mg to equal 15 mg  . NON FORMULARY Fluid Restriction - 1200 cc a day 500 cc for days, Evenings 500 cc, Night 200 cc  . NON FORMULARY Diet Type:  NAS, Cons CHO Diet  . Nutritional Supplements (NEPRO/CARBSTEADY PO) Take 1 Can by mouth 2 (two) times daily.  Marland Kitchen omega-3 acid ethyl esters (LOVAZA) 1 g capsule 1 gram; amt: 2 capsules; oral  Special Instructions: elevated triglycerides At Bedtime 09:00 PM  . omeprazole (PRILOSEC) 40 MG capsule Take 40 mg by mouth at bedtime.   . polyethylene glycol (MIRALAX / GLYCOLAX) packet Take 17 g by mouth as needed for mild constipation.   . sertraline (ZOLOFT) 50 MG tablet Take 25 mg by mouth daily.   . sevelamer carbonate (RENVELA) 800 MG tablet Take 1,600 mg by mouth 3 (three) times daily.   . sevelamer carbonate (RENVELA) 800 MG tablet Take 800 mg by mouth daily. With snacks  . tamsulosin (FLOMAX) 0.4 MG CAPS capsule Take 0.4 mg by mouth every evening. Give 30 minutes after a meal  . torsemide (DEMADEX) 10 MG tablet Take 5 tablets (50 mg total) by mouth daily.   No facility-administered encounter medications on file as of  01/09/2020.    Activities of Daily Living In your present state of health, do you have any difficulty performing the following activities: 01/09/2020 09/15/2019  Hearing? N N  Vision? N N  Difficulty concentrating or making decisions? Tempie Donning  Walking or climbing stairs? Y (No Data)  Comment - long term resident of SNF   Dressing or bathing? Y Y  Doing errands, shopping? Y -  Conservation officer, nature and eating ? Y -  Using the Toilet? Y -  In the past six months, have you accidently leaked urine? Y -  Do you have problems with loss of bowel control? Y -  Managing your Medications? Y -  Managing your Finances? Y -  Housekeeping or managing your Housekeeping? Y -  Some recent data might be hidden    Patient Care Team: Hennie Duos, MD as PCP - General (Internal Medicine) Nida, Marella Chimes, MD as Consulting Physician (Endocrinology) Fran Lowes, MD (Inactive) as Consulting Physician (Nephrology) Gala Romney Cristopher Estimable, MD as Consulting Physician (Gastroenterology) [facility] [place] [facility]) Nyoka Cowden Phylis Bougie, NP as Nurse Practitioner (Geriatric Medicine)   Assessment:   This is a routine wellness examination for [place].  Exercise Activities and Dietary recommendations Current Exercise Habits: The patient does not participate in regular exercise at present  Goals   None     Fall Risk Fall Risk  01/09/2020 09/15/2019 06/04/2019 05/26/2019 02/18/2018  Falls in the past year? 0 0 - (No Data) No  Comment - - - Emmi Telephone Survey: data to providers prior to load -  Number falls in past yr: - - 0 (No Data) -  Comment - - - Emmi Telephone Survey Actual Response =  -  Risk for fall due to : Impaired mobility - Impaired mobility - -   Is the patient's home free of loose throw rugs in walkways, pet beds, electrical cords, etc?  yes  Grab bars in the bathroom? Yes      Handrails on the stairs?   n/a      Adequate lighting?   yes  Timed Get Up and Go Performed:  patient not ambulatory   Depression Screen PHQ 2/9 Scores 01/09/2020 09/15/2019 06/04/2019 02/18/2018  PHQ - 2 Score 0 0 0 0    Cognitive Function     6CIT Screen 02/18/2018  What Year? 4 points  What month? 3 points  What time? 3 points  Count back from 20 4 points  Months in reverse 4 points  Repeat phrase 10 points  Total Score 28    Immunization History  Administered Date(s) Administered  . Hepatitis B, adult 09/30/2016, 10/28/2016, 12/02/2016, 03/28/2017, 07/02/2017, 08/13/2017, 09/12/2017, 01/14/2018  . Influenza,inj,Quad PF,6+ Mos 12/26/2013, 07/09/2015  . Influenza,inj,Quad PF,6-35 Mos 08/08/2019  . Influenza-Unspecified 07/28/2016, 07/31/2017, 07/29/2018, 08/08/2019  . Moderna SARS-COVID-2 Vaccination 11/02/2019, 11/30/2019  . PPD Test 03/07/2015, 08/08/2016, 05/15/2017, 08/31/2018, 10/10/2019  . Pneumococcal Conjugate-13 07/31/2017  . Pneumococcal Polysaccharide-23 03/04/2015  . Pneumococcal-Unspecified 06/12/2015, 12/03/2016  . Tdap 08/22/2017    Qualifies for Shingles Vaccine? Resident of snf  Screening Tests Health Maintenance  Topic Date Due  . FOOT EXAM  12/29/2019  . HEMOGLOBIN A1C  06/07/2020  . OPHTHALMOLOGY EXAM  09/07/2020  . COLONOSCOPY  04/28/2027  . TETANUS/TDAP  08/23/2027  . INFLUENZA VACCINE  Completed  . Hepatitis C Screening  Completed  . PNA vac Low Risk Adult  Completed  . URINE MICROALBUMIN  Discontinued   Cancer Screenings: Lung: Low Dose CT Chest recommended if Age 69-80 years, 30 pack-year currently smoking OR have quit w/in 15years. Patiennot  qualify. Colorectal: stools guaiac  Additional Screenings:  Hepatitis C Screening:      Plan:   patient long term resident of SNF.   I have personally reviewed and noted the following in the patient's chart:   . Medical and social history . Use of alcohol, tobacco or illicit drugs  . Current medications and supplements . Functional ability and status . Nutritional status . Physical  activity . Advanced directives . List of other physicians . Hospitalizations, surgeries, and ER visits in previous 12 months . Vitals . Screenings to include cognitive, depression, and falls . Referrals and appointments  In addition, I have reviewed and discussed with patient certain preventive protocols, quality metrics, and best practice recommendations. A written personalized care plan for preventive services as well as general preventive health recommendations were provided to patient.     Gerlene Fee, NP  01/09/2020

## 2020-01-09 NOTE — Progress Notes (Signed)
Location:    [place] Room Number: 112/D Place of Service:  SNF (31)   CODE STATUS: Full Code  Allergies  Allergen Reactions  . No Known Allergies     Chief Complaint  Patient presents with  . Medicare Wellness    Annual Wellness Visit    HPI:    Past Medical History:  Diagnosis Date  . Abnormal CT scan, kidney 10/06/2011  . Acute pyelonephritis 10/07/2011  . Anemia    normocytic  . Anxiety    mental retardation  . Bladder wall thickening 10/06/2011  . BPH (benign prostatic hypertrophy)   . Diabetes mellitus   . Dialysis patient [facility])    Tuesday, Thursday and Saturday,   . DVT of leg (deep venous thrombosis) ([place]) 12/25/2016  . Edema     history of lower extremity edema  . GERD (gastroesophageal reflux disease)   . Heme positive stool   . Hydronephrosis   . Hyperkalemia   . Hyperlipidemia   . Hypernatremia   . Hypertension   . Hypothyroidism   . Impaired speech   . 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 MR (mental retardation)   . Muscle weakness   . Obstructive uropathy   . 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 Poor historian poor historian  . Protein calorie malnutrition ([place])   . Pyelonephritis   . Renal failure (ARF), acute on chronic (HCC)   . Renal insufficiency    chronic history  . Sepsis ([place])   . Smoking   . Uremia   . Urinary retention   . UTI (lower urinary tract infection) 10/06/2011    Past Surgical History:  Procedure Laterality Date  . A/V FISTULAGRAM N/A 08/13/2018   Procedure: A/V FISTULAGRAM - Right Upper;  Surgeon: Elam Dutch, MD;  Location: [place] CV LAB;  Service: Cardiovascular;  Laterality: N/A;  . A/V FISTULAGRAM N/A 11/22/2018   Procedure: A/V FISTULAGRAM - Right Upper;  Surgeon: Waynetta Sandy, MD;  Location: [place] CV LAB;  Service: Cardiovascular;  Laterality: N/A;  . AV FISTULA PLACEMENT Left 07/06/2015   Procedure:  INSERTION LEFT ARM ARTERIOVENOUS GORTEX GRAFT;   Surgeon: Angelia Mould, MD;  Location: [place];  Service: Vascular;  Laterality: Left;  . AV FISTULA PLACEMENT Right 02/26/2016   Procedure: ARTERIOVENOUS (AV) FISTULA CREATION ;  Surgeon: Angelia Mould, MD;  Location: [place];  Service: Vascular;  Laterality: Right;  . AV FISTULA PLACEMENT Right 11/25/2018   Procedure: INSERTION OF ARTERIOVENOUS (AV) ARTEGRAFT RIGHT UPPER ARM;  Surgeon: Waynetta Sandy, MD;  Location: [place];  Service: Vascular;  Laterality: Right;  . [place] REMOVAL Left 10/09/2015   Procedure: REMOVAL OF ARTERIOVENOUS GORETEX GRAFT ([place]) Evacuation of Lymphocele, Vein Patch angioplasty of brachial artery.;  Surgeon: Angelia Mould, MD;  Location: [place];  Service: Vascular;  Laterality: Left;  . BASCILIC VEIN TRANSPOSITION Right 02/26/2016   Procedure: Right BASCILIC VEIN TRANSPOSITION;  Surgeon: Angelia Mould, MD;  Location: [place];  Service: Vascular;  Laterality: Right;  . CIRCUMCISION N/A 01/04/2014   Procedure: CIRCUMCISION ADULT (procedure #1);  Surgeon: Marissa Nestle, MD;  [facility];  Service: Urology;  Laterality: N/A;  . COLECTOMY N/A 05/04/2017   Procedure: TOTAL COLECTOMY;  Surgeon: Aviva Signs, MD;  [facility];  Service: General;  Laterality: N/A;  . COLONOSCOPY N/A 04/27/2017   Procedure: COLONOSCOPY;  Surgeon: Daneil Dolin, MD;  [facility];  Service: Endoscopy;  Laterality: N/A;  245  . CYSTOSCOPY W/ RETROGRADES Bilateral 06/29/2015   Procedure: CYSTOSCOPY, DILATION OF URETHRAL STRICTURE WITH BILATERAL RETROGRADE PYELOGRAM,SUPRAPUBIC TUBE CHANGE;  Surgeon: Festus Aloe, MD;  [facility]: [facility];  Service: Urology;  Laterality: Bilateral;  . CYSTOSCOPY WITH URETHRAL DILATATION N/A 12/29/2013   Procedure: CYSTOSCOPY WITH URETHRAL DILATATION;  Surgeon: Marissa Nestle, MD;  [facility];  Service: Urology;  Laterality: N/A;  . ESOPHAGOGASTRODUODENOSCOPY N/A 04/27/2017   Procedure: ESOPHAGOGASTRODUODENOSCOPY  (EGD);  Surgeon: Daneil Dolin, MD;  [facility];  Service: Endoscopy;  Laterality: N/A;  . INSERTION OF DIALYSIS CATHETER Right 11/25/2018   Procedure: INSERTION OF DIALYSIS CATHETER RIGHT INTERNAL JUGULAR;  Surgeon: Waynetta Sandy, MD;  Location: [place];  Service: Vascular;  Laterality: Right;  . IR AV DIALY SHUNT INTRO [place] W/PTA/IMG RIGHT Right 09/07/2018  . IR REMOVAL TUN CV CATH W/O FL  01/12/2019  . IR THROMBECTOMY AV FISTULA W/THROMBOLYSIS/PTA INC/SHUNT/IMG RIGHT Right 04/26/2018  . IR US GUIDE VASC ACCESS RIGHT  04/26/2018  . IR US GUIDE VASC ACCESS RIGHT  09/07/2018  . ORIF FEMUR FRACTURE Right 11/22/2016   Procedure: OPEN REDUCTION INTERNAL FIXATION (ORIF) DISTAL FEMUR FRACTURE;  Surgeon: Rod Can, MD;  Location: [place];  Service: Orthopedics;  Laterality: Right;  . PATCH ANGIOPLASTY Right 12/10/2017   Procedure: PATCH ANGIOPLASTY;  Surgeon: Angelia Mould, MD;  Location: [facility] OR;  Service: Vascular;  Laterality: Right;  . PERIPHERAL VASCULAR BALLOON ANGIOPLASTY  08/13/2018   Procedure: PERIPHERAL VASCULAR BALLOON ANGIOPLASTY;  Surgeon: Elam Dutch, MD;  Location: [place] CV LAB;  Service: Cardiovascular;;  right AV fistula   . PERIPHERAL VASCULAR BALLOON ANGIOPLASTY  11/22/2018   Procedure: PERIPHERAL VASCULAR BALLOON ANGIOPLASTY;  Surgeon: Waynetta Sandy, MD;  Location: [place] CV LAB;  Service: Cardiovascular;;  rt AV fistula  . PERIPHERAL VASCULAR CATHETERIZATION N/A 10/08/2015   Procedure: A/V Shuntogram;  Surgeon: Angelia Mould, MD;  Location: [place] CV LAB;  Service: Cardiovascular;  Laterality: N/A;  . THROMBECTOMY W/ EMBOLECTOMY Right 12/10/2017   Procedure: THROMBECTOMY REVISION RIGHT ARM  ARTERIOVENOUS FISTULA;  Surgeon: Angelia Mould, MD;  Location: [place];  Service: Vascular;  Laterality: Right;  . TRANSURETHRAL RESECTION OF PROSTATE N/A 01/04/2014   Procedure: TRANSURETHRAL  RESECTION OF THE PROSTATE (TURP) (procedure #2);  Surgeon: Marissa Nestle, MD;  [facility];  Service: Urology;  Laterality: N/A;    Social History   Socioeconomic History  . Marital status: Single    Spouse name: Not on file  . Number of children: Not on file  . Years of education: Not on file  . Highest education level: Not on file  Occupational History  . Occupation: retired   Tobacco Use  . Smoking status: Never Smoker  . Smokeless tobacco: Never Used  Substance and Sexual Activity  . Alcohol use: No  . Drug use: No  . Sexual activity: Not Currently  Other Topics Concern  . 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   Long term resident of [facility]    Social Determinants of Health   Financial Resource Strain:   . Difficulty of Paying Living Expenses:   Food Insecurity:   . Worried About Charity fundraiser in the Last Year:   . Arboriculturist in the Last Year:   Transportation Needs:   . Film/video editor (Medical):   Marland Kitchen Lack of  Transportation (Non-Medical):   Physical Activity: Unknown  . Days of Exercise per Week: Not on file  . Minutes of Exercise per Session: 0 min  Stress:   . Feeling of Stress :   Social Connections: Unknown  . Frequency of Communication with Friends and Family: Not on file  . Frequency of Social Gatherings with Friends and Family: Never  . Attends Religious Services: Not on file  . Active Member of Clubs or Organizations: Not on file  . Attends Archivist Meetings: Not on file  . Marital Status: Not on file  Intimate Partner Violence:   . Fear of Current or Ex-Partner:   . Emotionally Abused:   Marland Kitchen Physically Abused:   . Sexually Abused:    Family History  Problem Relation Age of Onset  . Cancer Mother   . Colon cancer Neg Hx       VITAL SIGNS BP (!) 96/58   Pulse 68   Temp 98.6 F (37 C) (Oral)   Resp 20   Ht 5\' 8"  (1.727 m)   Wt  213 lb (96.6 kg)   SpO2 100%   BMI 32.39 kg/m   Outpatient Encounter Medications as of 01/09/2020  Medication Sig  . atorvastatin (LIPITOR) 80 MG tablet Take 80 mg by mouth every evening.  . insulin aspart (NOVOLOG FLEXPEN) 100 UNIT/ML FlexPen Inject 8 Units into the skin 3 (three) times daily with meals. Special Instructions: on dialysis days Monday,wednesday and Friday. 8 units if cbg above 150 on non dialysis days  . Insulin Glargine (BASAGLAR KWIKPEN) 100 UNIT/ML SOPN Inject 30 Units into the skin at bedtime.   Marland Kitchen JANUVIA 25 MG tablet Take 25 mg by mouth daily.   Marland Kitchen levothyroxine (SYNTHROID, LEVOTHROID) 88 MCG tablet Take 88 mcg by mouth daily before breakfast.  . midodrine (PROAMATINE) 10 MG tablet Take 10 mg by mouth. Send med with resident on Mon/Wed/Fri to dialysis. do not give at [facility] give to resident to take with him. send with 5mg  to equal 15mg .  . midodrine (PROAMATINE) 5 MG tablet Take 5 mg by mouth. Send with resident on dialysis days.  Mon, Wed, Fri.  Take along with 10 mg to equal 15 mg  . NON FORMULARY Fluid Restriction - 1200 cc a day 500 cc for days, Evenings 500 cc, Night 200 cc  . NON FORMULARY Diet Type:  NAS, Cons CHO Diet  . Nutritional Supplements (NEPRO/CARBSTEADY PO) Take 1 Can by mouth 2 (two) times daily.  Marland Kitchen omega-3 acid ethyl esters (LOVAZA) 1 g capsule 1 gram; amt: 2 capsules; oral  Special Instructions: elevated triglycerides At Bedtime 09:00 PM  . omeprazole (PRILOSEC) 40 MG capsule Take 40 mg by mouth at bedtime.   . polyethylene glycol (MIRALAX / GLYCOLAX) packet Take 17 g by mouth as needed for mild constipation.   . sertraline (ZOLOFT) 50 MG tablet Take 25 mg by mouth daily.   . sevelamer carbonate (RENVELA) 800 MG tablet Take 1,600 mg by mouth 3 (three) times daily.   . sevelamer carbonate (RENVELA) 800 MG tablet Take 800 mg by mouth daily. With snacks  . tamsulosin (FLOMAX) 0.4 MG CAPS capsule Take 0.4 mg by mouth every evening. Give 30 minutes  after a meal  . torsemide (DEMADEX) 10 MG tablet Take 5 tablets (50 mg total) by mouth daily.   No facility-administered encounter medications on file as of 01/09/2020.     SIGNIFICANT DIAGNOSTIC EXAMS       ASSESSMENT/ PLAN:  MD is aware of resident's narcotic use and is in agreement with current plan of care. We will attempt to wean resident as appropriate.  Deborah Green NP [facility]  Contact [phone] Monday through Friday 8am- 5pm  After hours call [phone]   

## 2020-01-09 NOTE — Patient Instructions (Signed)
  [name] , Thank you for taking time to come for your Medicare Wellness Visit. I appreciate your ongoing commitment to your health goals. Please review the following plan we discussed and let me know if I can assist you in the future.   These are the goals we discussed: Goals   None     This is a list of the screening recommended for you and due dates:  Health Maintenance  Topic Date Due  . Complete foot exam   12/29/2019  . Hemoglobin A1C  06/07/2020  . Eye exam for diabetics  09/07/2020  . Colon Cancer Screening  04/28/2027  . Tetanus Vaccine  08/23/2027  . Flu Shot  Completed  .  Hepatitis C: One time screening is recommended by Center for Disease Control  (CDC) for  adults born from [birth year] through 1963.   Completed  . Pneumonia vaccines  Completed  . Urine Protein Check  Discontinued

## 2020-01-10 DIAGNOSIS — M6281 Muscle weakness (generalized): Secondary | ICD-10-CM | POA: Diagnosis not present

## 2020-01-10 DIAGNOSIS — E1129 Type 2 diabetes mellitus with other diabetic kidney complication: Secondary | ICD-10-CM | POA: Diagnosis not present

## 2020-01-10 DIAGNOSIS — Z9181 History of falling: Secondary | ICD-10-CM | POA: Diagnosis not present

## 2020-01-10 DIAGNOSIS — R262 Difficulty in walking, not elsewhere classified: Secondary | ICD-10-CM | POA: Diagnosis not present

## 2020-01-11 DIAGNOSIS — I120 Hypertensive chronic kidney disease with stage 5 chronic kidney disease or end stage renal disease: Secondary | ICD-10-CM | POA: Diagnosis not present

## 2020-01-11 DIAGNOSIS — M6281 Muscle weakness (generalized): Secondary | ICD-10-CM | POA: Diagnosis not present

## 2020-01-11 DIAGNOSIS — N2581 Secondary hyperparathyroidism of renal origin: Secondary | ICD-10-CM | POA: Diagnosis not present

## 2020-01-11 DIAGNOSIS — D509 Iron deficiency anemia, unspecified: Secondary | ICD-10-CM | POA: Diagnosis not present

## 2020-01-11 DIAGNOSIS — E1129 Type 2 diabetes mellitus with other diabetic kidney complication: Secondary | ICD-10-CM | POA: Diagnosis not present

## 2020-01-11 DIAGNOSIS — D631 Anemia in chronic kidney disease: Secondary | ICD-10-CM | POA: Diagnosis not present

## 2020-01-11 DIAGNOSIS — Z1159 Encounter for screening for other viral diseases: Secondary | ICD-10-CM | POA: Diagnosis not present

## 2020-01-11 DIAGNOSIS — Z992 Dependence on renal dialysis: Secondary | ICD-10-CM | POA: Diagnosis not present

## 2020-01-11 DIAGNOSIS — Z9181 History of falling: Secondary | ICD-10-CM | POA: Diagnosis not present

## 2020-01-11 DIAGNOSIS — R262 Difficulty in walking, not elsewhere classified: Secondary | ICD-10-CM | POA: Diagnosis not present

## 2020-01-11 DIAGNOSIS — N186 End stage renal disease: Secondary | ICD-10-CM | POA: Diagnosis not present

## 2020-01-12 DIAGNOSIS — E1129 Type 2 diabetes mellitus with other diabetic kidney complication: Secondary | ICD-10-CM | POA: Diagnosis not present

## 2020-01-12 DIAGNOSIS — Z9181 History of falling: Secondary | ICD-10-CM | POA: Diagnosis not present

## 2020-01-12 DIAGNOSIS — M6281 Muscle weakness (generalized): Secondary | ICD-10-CM | POA: Diagnosis not present

## 2020-01-12 DIAGNOSIS — R262 Difficulty in walking, not elsewhere classified: Secondary | ICD-10-CM | POA: Diagnosis not present

## 2020-01-13 DIAGNOSIS — D509 Iron deficiency anemia, unspecified: Secondary | ICD-10-CM | POA: Diagnosis not present

## 2020-01-13 DIAGNOSIS — D631 Anemia in chronic kidney disease: Secondary | ICD-10-CM | POA: Diagnosis not present

## 2020-01-13 DIAGNOSIS — N186 End stage renal disease: Secondary | ICD-10-CM | POA: Diagnosis not present

## 2020-01-13 DIAGNOSIS — N2581 Secondary hyperparathyroidism of renal origin: Secondary | ICD-10-CM | POA: Diagnosis not present

## 2020-01-13 DIAGNOSIS — Z992 Dependence on renal dialysis: Secondary | ICD-10-CM | POA: Diagnosis not present

## 2020-01-16 DIAGNOSIS — Z992 Dependence on renal dialysis: Secondary | ICD-10-CM | POA: Diagnosis not present

## 2020-01-16 DIAGNOSIS — N186 End stage renal disease: Secondary | ICD-10-CM | POA: Diagnosis not present

## 2020-01-16 DIAGNOSIS — D631 Anemia in chronic kidney disease: Secondary | ICD-10-CM | POA: Diagnosis not present

## 2020-01-16 DIAGNOSIS — D509 Iron deficiency anemia, unspecified: Secondary | ICD-10-CM | POA: Diagnosis not present

## 2020-01-16 DIAGNOSIS — N2581 Secondary hyperparathyroidism of renal origin: Secondary | ICD-10-CM | POA: Diagnosis not present

## 2020-01-17 DIAGNOSIS — E1129 Type 2 diabetes mellitus with other diabetic kidney complication: Secondary | ICD-10-CM | POA: Diagnosis not present

## 2020-01-17 DIAGNOSIS — Z9181 History of falling: Secondary | ICD-10-CM | POA: Diagnosis not present

## 2020-01-17 DIAGNOSIS — R262 Difficulty in walking, not elsewhere classified: Secondary | ICD-10-CM | POA: Diagnosis not present

## 2020-01-17 DIAGNOSIS — M6281 Muscle weakness (generalized): Secondary | ICD-10-CM | POA: Diagnosis not present

## 2020-01-18 DIAGNOSIS — N186 End stage renal disease: Secondary | ICD-10-CM | POA: Diagnosis not present

## 2020-01-18 DIAGNOSIS — M6281 Muscle weakness (generalized): Secondary | ICD-10-CM | POA: Diagnosis not present

## 2020-01-18 DIAGNOSIS — N2581 Secondary hyperparathyroidism of renal origin: Secondary | ICD-10-CM | POA: Diagnosis not present

## 2020-01-18 DIAGNOSIS — D509 Iron deficiency anemia, unspecified: Secondary | ICD-10-CM | POA: Diagnosis not present

## 2020-01-18 DIAGNOSIS — E1129 Type 2 diabetes mellitus with other diabetic kidney complication: Secondary | ICD-10-CM | POA: Diagnosis not present

## 2020-01-18 DIAGNOSIS — Z9181 History of falling: Secondary | ICD-10-CM | POA: Diagnosis not present

## 2020-01-18 DIAGNOSIS — D631 Anemia in chronic kidney disease: Secondary | ICD-10-CM | POA: Diagnosis not present

## 2020-01-18 DIAGNOSIS — R262 Difficulty in walking, not elsewhere classified: Secondary | ICD-10-CM | POA: Diagnosis not present

## 2020-01-18 DIAGNOSIS — Z992 Dependence on renal dialysis: Secondary | ICD-10-CM | POA: Diagnosis not present

## 2020-01-19 ENCOUNTER — Encounter: Payer: Self-pay | Admitting: Adult Health

## 2020-01-19 DIAGNOSIS — E1129 Type 2 diabetes mellitus with other diabetic kidney complication: Secondary | ICD-10-CM | POA: Diagnosis not present

## 2020-01-19 DIAGNOSIS — Z0000 Encounter for general adult medical examination without abnormal findings: Secondary | ICD-10-CM

## 2020-01-19 DIAGNOSIS — M6281 Muscle weakness (generalized): Secondary | ICD-10-CM | POA: Diagnosis not present

## 2020-01-19 DIAGNOSIS — R262 Difficulty in walking, not elsewhere classified: Secondary | ICD-10-CM | POA: Diagnosis not present

## 2020-01-19 DIAGNOSIS — Z9181 History of falling: Secondary | ICD-10-CM | POA: Diagnosis not present

## 2020-01-19 NOTE — Progress Notes (Signed)
Subjective:   Zachary [name] is a 74 y.o. male who presents for Medicare Annual/Subsequent preventive examination.  Review of Systems:  ROS  Cardiac Risk Factors include: advanced age (>69men, >26 women);diabetes mellitus;hypertension;sedentary lifestyle;male gender;obesity (BMI >30kg/m2)     Objective:    Vitals: BP (!) 96/58   Pulse 68   Temp 98.2 F (36.8 C) (Oral)   Resp 20   Ht 5\' 8"  (1.727 m)   Wt 213 lb (96.6 kg)   SpO2 100%   BMI 32.39 kg/m   Body mass index is 32.39 kg/m.  Advanced Directives 01/09/2020 01/05/2020 01/04/2020 12/02/2019 11/21/2019 11/01/2019 10/27/2019  Does Patient Have a Medical Advance Directive? Yes Yes Yes Yes Yes Yes Yes  Type of Advance Directive (No Data) (No Data) (No Data) (No Data) (No Data) (No Data) (No Data)  Does patient want to make changes to medical advance directive? No - Patient declined No - Patient declined No - Patient declined No - Patient declined No - Patient declined No - Patient declined No - Patient declined  Copy of [place] in [place]  Would patient like information on creating a medical advance directive? - - - - - - -  Pre-existing out of facility DNR order (yellow form or pink MOST form) - - - - - - -    Tobacco Social History   Tobacco Use  Smoking Status Never Smoker  Smokeless Tobacco Never Used     Counseling given: Not Answered   Clinical Intake:  Pre-visit preparation completed: Yes  Pain : No/denies pain     BMI - recorded: 32.39 Nutritional Status: BMI > 30  Obese Diabetes: Yes CBG done?: (done per nursing staff)  How often do you need to have someone help you when you read instructions, pamphlets, or other written materials from your doctor or pharmacy?: 5 - Always  Interpreter Needed?: No     Past Medical History:  Diagnosis Date  . Abnormal CT scan, kidney 10/06/2011  . Acute pyelonephritis 10/07/2011  . Anemia    normocytic  . Anxiety    mental  retardation  . Bladder wall thickening 10/06/2011  . BPH (benign prostatic hypertrophy)   . Diabetes mellitus   . Dialysis patient [facility])    Tuesday, Thursday and Saturday,   . DVT of leg (deep venous thrombosis) ([place]) 12/25/2016  . Edema     history of lower extremity edema  . GERD (gastroesophageal reflux disease)   . Heme positive stool   . Hydronephrosis   . Hyperkalemia   . Hyperlipidemia   . Hypernatremia   . Hypertension   . Hypothyroidism   . Impaired speech   . 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 MR (mental retardation)   . Muscle weakness   . Obstructive uropathy   . 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 Poor historian poor historian  . Protein calorie malnutrition ([place])   . Pyelonephritis   . Renal failure (ARF), acute on chronic (HCC)   . Renal insufficiency    chronic history  . Sepsis ([place])   . Smoking   . Uremia   . Urinary retention   . UTI (lower urinary tract infection) 10/06/2011   Past Surgical History:  Procedure Laterality Date  . A/V FISTULAGRAM N/A 08/13/2018   Procedure: A/V FISTULAGRAM - Right Upper;  Surgeon: Elam Dutch, MD;  Location: [place] [facility];  Service: Cardiovascular;  Laterality: N/A;  .  A/V FISTULAGRAM N/A 11/22/2018   Procedure: A/V FISTULAGRAM - Right Upper;  Surgeon: Waynetta Sandy, MD;  Location: [place] [facility];  Service: Cardiovascular;  Laterality: N/A;  . AV FISTULA PLACEMENT Left 07/06/2015   Procedure:  INSERTION LEFT ARM ARTERIOVENOUS GORTEX GRAFT;  Surgeon: Angelia Mould, MD;  Location: [place];  Service: Vascular;  Laterality: Left;  . AV FISTULA PLACEMENT Right 02/26/2016   Procedure: ARTERIOVENOUS (AV) FISTULA CREATION ;  Surgeon: Angelia Mould, MD;  Location: [place];  Service: Vascular;  Laterality: Right;  . AV FISTULA PLACEMENT Right 11/25/2018   Procedure: INSERTION OF ARTERIOVENOUS (AV) ARTEGRAFT RIGHT UPPER ARM;  Surgeon: Waynetta Sandy, MD;  Location: [place];  Service:  Vascular;  Laterality: Right;  . [place] REMOVAL Left 10/09/2015   Procedure: REMOVAL OF ARTERIOVENOUS GORETEX GRAFT ([place]) Evacuation of Lymphocele, Vein Patch angioplasty of brachial artery.;  Surgeon: Angelia Mould, MD;  Location: [place];  Service: Vascular;  Laterality: Left;  . BASCILIC VEIN TRANSPOSITION Right 02/26/2016   Procedure: Right BASCILIC VEIN TRANSPOSITION;  Surgeon: Angelia Mould, MD;  Location: [place];  Service: Vascular;  Laterality: Right;  . CIRCUMCISION N/A 01/04/2014   Procedure: CIRCUMCISION ADULT (procedure #1);  Surgeon: Marissa Nestle, MD;  [facility];  Service: Urology;  Laterality: N/A;  . COLECTOMY N/A 05/04/2017   Procedure: TOTAL COLECTOMY;  Surgeon: Aviva Signs, MD;  [facility];  Service: General;  Laterality: N/A;  . COLONOSCOPY N/A 04/27/2017   Procedure: COLONOSCOPY;  Surgeon: Daneil Dolin, MD;  [facility];  Service: Endoscopy;  Laterality: N/A;  245  . CYSTOSCOPY W/ RETROGRADES Bilateral 06/29/2015   Procedure: CYSTOSCOPY, DILATION OF URETHRAL STRICTURE WITH BILATERAL RETROGRADE PYELOGRAM,SUPRAPUBIC TUBE CHANGE;  Surgeon: Festus Aloe, MD;  [facility];  Service: Urology;  Laterality: Bilateral;  . CYSTOSCOPY WITH URETHRAL DILATATION N/A 12/29/2013   Procedure: CYSTOSCOPY WITH URETHRAL DILATATION;  Surgeon: Marissa Nestle, MD;  [facility];  Service: Urology;  Laterality: N/A;  . ESOPHAGOGASTRODUODENOSCOPY N/A 04/27/2017   Procedure: ESOPHAGOGASTRODUODENOSCOPY (EGD);  Surgeon: Daneil Dolin, MD;  [facility];  Service: Endoscopy;  Laterality: N/A;  . INSERTION OF DIALYSIS CATHETER Right 11/25/2018   Procedure: INSERTION OF DIALYSIS CATHETER RIGHT INTERNAL JUGULAR;  Surgeon: Waynetta Sandy, MD;  Location: [place];  Service: Vascular;  Laterality: Right;  . IR AV DIALY SHUNT INTRO [place] W/PTA/IMG RIGHT Right 09/07/2018  . IR REMOVAL TUN CV CATH W/O FL  01/12/2019  . IR  THROMBECTOMY AV FISTULA W/THROMBOLYSIS/PTA INC/SHUNT/IMG RIGHT Right 04/26/2018  . IR US GUIDE VASC ACCESS RIGHT  04/26/2018  . IR US GUIDE VASC ACCESS RIGHT  09/07/2018  . ORIF FEMUR FRACTURE Right 11/22/2016   Procedure: OPEN REDUCTION INTERNAL FIXATION (ORIF) DISTAL FEMUR FRACTURE;  Surgeon: Rod Can, MD;  Location: [place];  Service: Orthopedics;  Laterality: Right;  . PATCH ANGIOPLASTY Right 12/10/2017   Procedure: PATCH ANGIOPLASTY;  Surgeon: Angelia Mould, MD;  [facility];  Service: Vascular;  Laterality: Right;  . PERIPHERAL VASCULAR BALLOON ANGIOPLASTY  08/13/2018   Procedure: PERIPHERAL VASCULAR BALLOON ANGIOPLASTY;  Surgeon: Elam Dutch, MD;  Location: [place] [facility];  Service: Cardiovascular;;  right AV fistula   . PERIPHERAL VASCULAR BALLOON ANGIOPLASTY  11/22/2018   Procedure: PERIPHERAL VASCULAR BALLOON ANGIOPLASTY;  Surgeon: Waynetta Sandy, MD;  Location: [place] [facility];  Service: Cardiovascular;;  rt AV fistula  . PERIPHERAL VASCULAR CATHETERIZATION N/A 10/08/2015  Procedure: A/V Shuntogram;  Surgeon: Angelia Mould, MD;  Location: [place] [facility];  Service: Cardiovascular;  Laterality: N/A;  . THROMBECTOMY W/ EMBOLECTOMY Right 12/10/2017   Procedure: THROMBECTOMY REVISION RIGHT ARM  ARTERIOVENOUS FISTULA;  Surgeon: Angelia Mould, MD;  Location: [place];  Service: Vascular;  Laterality: Right;  . TRANSURETHRAL RESECTION OF PROSTATE N/A 01/04/2014   Procedure: TRANSURETHRAL RESECTION OF THE PROSTATE (TURP) (procedure #2);  Surgeon: Marissa Nestle, MD;  [facility];  Service: Urology;  Laterality: N/A;   Family History  Problem Relation Age of Onset  . Cancer Mother   . Colon cancer Neg Hx    Social History   Socioeconomic History  . Marital status: Single    Spouse name: Not on file  . Number of children: Not on file  . Years of education: Not on file  . Highest education level: Not on file  Occupational  History  . Occupation: retired   Tobacco Use  . Smoking status: Never Smoker  . Smokeless tobacco: Never Used  Substance and Sexual Activity  . Alcohol use: No  . Drug use: No  . Sexual activity: Not Currently  Other Topics Concern  . 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   Long term resident of [facility]    Social Determinants of Health   Financial Resource Strain:   . Difficulty of Paying Living Expenses:   Food Insecurity:   . Worried About Charity fundraiser in the Last Year:   . Arboriculturist in the Last Year:   Transportation Needs:   . Film/video editor (Medical):   Marland Kitchen Lack of Transportation (Non-Medical):   Physical Activity: Unknown  . Days of Exercise per Week: Not on file  . Minutes of Exercise per Session: 0 min  Stress:   . Feeling of Stress :   Social Connections: Unknown  . Frequency of Communication with Friends and Family: Not on file  . Frequency of Social Gatherings with Friends and Family: Never  . Attends Religious Services: Not on file  . Active Member of Clubs or Organizations: Not on file  . Attends Archivist Meetings: Not on file  . Marital Status: Not on file    Outpatient Encounter Medications as of 01/19/2020  Medication Sig  . atorvastatin (LIPITOR) 80 MG tablet Take 80 mg by mouth every evening.  . insulin aspart (NOVOLOG FLEXPEN) 100 UNIT/ML FlexPen Inject 8 Units into the skin 3 (three) times daily with meals. Special Instructions: on dialysis days Monday,wednesday and Friday. 8 units if cbg above 150 on non dialysis days  . Insulin Glargine (BASAGLAR KWIKPEN) 100 UNIT/ML SOPN Inject 30 Units into the skin at bedtime.   Marland Kitchen JANUVIA 25 MG tablet Take 25 mg by mouth daily.   Marland Kitchen levothyroxine (SYNTHROID, LEVOTHROID) 88 MCG tablet Take 88 mcg by mouth daily before breakfast.  . midodrine (PROAMATINE) 10 MG tablet Take 10 mg by mouth. Send med with resident on  Mon/Wed/Fri to dialysis. do not give [facility] give to resident to take with him. send with 5mg  to equal 15mg .  . midodrine (PROAMATINE) 5 MG tablet Take 5 mg by mouth. Send with resident on dialysis days.  Mon, Wed, Fri.  Take along with 10 mg to equal 15 mg  . NON FORMULARY Fluid Restriction - 1200 cc a day 500 cc for days, Evenings 500 cc, Night 200 cc  .  NON FORMULARY Diet Type:  NAS, Cons CHO Diet  . Nutritional Supplements (NEPRO/CARBSTEADY PO) Take 1 Can by mouth 2 (two) times daily.  Marland Kitchen omega-3 acid ethyl esters (LOVAZA) 1 g capsule 1 gram; amt: 2 capsules; oral  Special Instructions: elevated triglycerides At Bedtime 09:00 PM  . omeprazole (PRILOSEC) 40 MG capsule Take 40 mg by mouth at bedtime.   . polyethylene glycol (MIRALAX / GLYCOLAX) packet Take 17 g by mouth as needed for mild constipation.   . sertraline (ZOLOFT) 50 MG tablet Take 25 mg by mouth daily.   . sevelamer carbonate (RENVELA) 800 MG tablet Take 1,600 mg by mouth 3 (three) times daily.   . sevelamer carbonate (RENVELA) 800 MG tablet Take 800 mg by mouth daily. With snacks  . tamsulosin (FLOMAX) 0.4 MG CAPS capsule Take 0.4 mg by mouth every evening. Give 30 minutes after a meal  . torsemide (DEMADEX) 10 MG tablet Take 5 tablets (50 mg total) by mouth daily.   No facility-administered encounter medications on file as of 01/19/2020.    Activities of Daily Living In your present state of health, do you have any difficulty performing the following activities: 01/19/2020 01/09/2020  Hearing? N N  Vision? N N  Difficulty concentrating or making decisions? Tempie Donning  Walking or climbing stairs? Y Y  Comment - -  Dressing or bathing? Y Y  Doing errands, shopping? Tempie Donning  Preparing Food and eating ? Y Y  Using the Toilet? Y Y  In the past six months, have you accidently leaked urine? Y Y  Do you have problems with loss of bowel control? Y Y  Managing your Medications? Y Y  Managing your Finances? Tempie Donning  Housekeeping or  managing your Housekeeping? Tempie Donning  Some recent data might be hidden    Patient Care Team: Hennie Duos, MD as PCP - General (Internal Medicine) Nida, Marella Chimes, MD as Consulting Physician (Endocrinology) Fran Lowes, MD (Inactive) as Consulting Physician (Nephrology) Gala Romney Cristopher Estimable, MD as Consulting Physician (Gastroenterology) [facility] Nyoka Cowden Phylis Bougie, NP as Nurse Practitioner (Geriatric Medicine)   Assessment:   This is a routine wellness examination for [place].  Exercise Activities and Dietary recommendations Current Exercise Habits: The patient does not participate in regular exercise at present  Goals   None     Fall Risk Fall Risk  01/19/2020 01/09/2020 09/15/2019 06/04/2019 05/26/2019  Falls in the past year? 0 0 0 - (No Data)  Comment - - - - Emmi Telephone Survey: data to providers prior to load  Number falls in past yr: - - - 0 (No Data)  Comment - - - - Emmi Telephone Survey Actual Response =   Risk for fall due to : Impaired balance/gait;Impaired mobility Impaired mobility - Impaired mobility -   Is the patient's home free of loose throw rugs in walkways, pet beds, electrical cords, etc?         Grab bars in the bathroom?       Handrails on the stairs?         Adequate lighting?     Timed Get Up and Go Performed:   Depression Screen PHQ 2/9 Scores 01/19/2020 01/09/2020 09/15/2019 06/04/2019  PHQ - 2 Score 0 0 0 0    Cognitive Function     6CIT Screen 02/18/2018  What Year? 4 points  What month? 3 points  What time? 3 points  Count back from 20 4 points  Months  in reverse 4 points  Repeat phrase 10 points  Total Score 28    Immunization History  Administered Date(s) Administered  . Hepatitis B, adult 09/30/2016, 10/28/2016, 12/02/2016, 03/28/2017, 07/02/2017, 08/13/2017, 09/12/2017, 01/14/2018  . Influenza,inj,Quad PF,6+ Mos 12/26/2013, 07/09/2015  . Influenza,inj,Quad PF,6-35 Mos 08/08/2019  .  Influenza-Unspecified 07/28/2016, 07/31/2017, 07/29/2018, 08/08/2019  . Moderna SARS-COVID-2 Vaccination 11/02/2019, 11/30/2019  . PPD Test 03/07/2015, 08/08/2016, 05/15/2017, 08/31/2018, 10/10/2019  . Pneumococcal Conjugate-13 07/31/2017  . Pneumococcal Polysaccharide-23 03/04/2015  . Pneumococcal-Unspecified 06/12/2015, 12/03/2016  . Tdap 08/22/2017    Qualifies for Shingles Vaccine?   Screening Tests Health Maintenance  Topic Date Due  . HEMOGLOBIN A1C  06/07/2020  . FOOT EXAM  08/18/2020  . OPHTHALMOLOGY EXAM  09/07/2020  . COLONOSCOPY  04/28/2027  . TETANUS/TDAP  08/23/2027  . INFLUENZA VACCINE  Completed  . Hepatitis C Screening  Completed  . PNA vac Low Risk Adult  Completed  . URINE MICROALBUMIN  Discontinued   Cancer Screenings: Lung: Low Dose CT Chest recommended if Age 49-80 years, 30 pack-year currently smoking OR have quit w/in 15years. Patient  qualify. Colorectal:   Additional Screenings:  Hepatitis C Screening:      Plan:     I have personally reviewed and noted the following in the patient's chart:   . Medical and social history . Use of alcohol, tobacco or illicit drugs  . Current medications and supplements . Functional ability and status . Nutritional status . Physical activity . Advanced directives . List of other physicians . Hospitalizations, surgeries, and ER visits in previous 12 months . Vitals . Screenings to include cognitive, depression, and falls . Referrals and appointments  In addition, I have reviewed and discussed with patient certain preventive protocols, quality metrics, and best practice recommendations. A written personalized care plan for preventive services as well as general preventive health recommendations were provided to patient.     Gerlene Fee, NP  01/19/2020   This encounter was created in error - please disregard.

## 2020-01-20 DIAGNOSIS — D631 Anemia in chronic kidney disease: Secondary | ICD-10-CM | POA: Diagnosis not present

## 2020-01-20 DIAGNOSIS — N2581 Secondary hyperparathyroidism of renal origin: Secondary | ICD-10-CM | POA: Diagnosis not present

## 2020-01-20 DIAGNOSIS — Z992 Dependence on renal dialysis: Secondary | ICD-10-CM | POA: Diagnosis not present

## 2020-01-20 DIAGNOSIS — N186 End stage renal disease: Secondary | ICD-10-CM | POA: Diagnosis not present

## 2020-01-20 DIAGNOSIS — D509 Iron deficiency anemia, unspecified: Secondary | ICD-10-CM | POA: Diagnosis not present

## 2020-01-23 DIAGNOSIS — D509 Iron deficiency anemia, unspecified: Secondary | ICD-10-CM | POA: Diagnosis not present

## 2020-01-23 DIAGNOSIS — Z992 Dependence on renal dialysis: Secondary | ICD-10-CM | POA: Diagnosis not present

## 2020-01-23 DIAGNOSIS — D631 Anemia in chronic kidney disease: Secondary | ICD-10-CM | POA: Diagnosis not present

## 2020-01-23 DIAGNOSIS — N186 End stage renal disease: Secondary | ICD-10-CM | POA: Diagnosis not present

## 2020-01-23 DIAGNOSIS — N2581 Secondary hyperparathyroidism of renal origin: Secondary | ICD-10-CM | POA: Diagnosis not present

## 2020-01-24 DIAGNOSIS — E1129 Type 2 diabetes mellitus with other diabetic kidney complication: Secondary | ICD-10-CM | POA: Diagnosis not present

## 2020-01-24 DIAGNOSIS — Z9181 History of falling: Secondary | ICD-10-CM | POA: Diagnosis not present

## 2020-01-24 DIAGNOSIS — R262 Difficulty in walking, not elsewhere classified: Secondary | ICD-10-CM | POA: Diagnosis not present

## 2020-01-24 DIAGNOSIS — M6281 Muscle weakness (generalized): Secondary | ICD-10-CM | POA: Diagnosis not present

## 2020-01-25 DIAGNOSIS — N186 End stage renal disease: Secondary | ICD-10-CM | POA: Diagnosis not present

## 2020-01-25 DIAGNOSIS — N2581 Secondary hyperparathyroidism of renal origin: Secondary | ICD-10-CM | POA: Diagnosis not present

## 2020-01-25 DIAGNOSIS — D631 Anemia in chronic kidney disease: Secondary | ICD-10-CM | POA: Diagnosis not present

## 2020-01-25 DIAGNOSIS — D509 Iron deficiency anemia, unspecified: Secondary | ICD-10-CM | POA: Diagnosis not present

## 2020-01-25 DIAGNOSIS — Z992 Dependence on renal dialysis: Secondary | ICD-10-CM | POA: Diagnosis not present

## 2020-01-26 DIAGNOSIS — F4322 Adjustment disorder with anxiety: Secondary | ICD-10-CM | POA: Diagnosis not present

## 2020-01-26 DIAGNOSIS — F70 Mild intellectual disabilities: Secondary | ICD-10-CM | POA: Diagnosis not present

## 2020-01-27 DIAGNOSIS — N2581 Secondary hyperparathyroidism of renal origin: Secondary | ICD-10-CM | POA: Diagnosis not present

## 2020-01-27 DIAGNOSIS — Z992 Dependence on renal dialysis: Secondary | ICD-10-CM | POA: Diagnosis not present

## 2020-01-27 DIAGNOSIS — D509 Iron deficiency anemia, unspecified: Secondary | ICD-10-CM | POA: Diagnosis not present

## 2020-01-27 DIAGNOSIS — N186 End stage renal disease: Secondary | ICD-10-CM | POA: Diagnosis not present

## 2020-01-27 DIAGNOSIS — D631 Anemia in chronic kidney disease: Secondary | ICD-10-CM | POA: Diagnosis not present

## 2020-01-30 DIAGNOSIS — Z992 Dependence on renal dialysis: Secondary | ICD-10-CM | POA: Diagnosis not present

## 2020-01-30 DIAGNOSIS — D631 Anemia in chronic kidney disease: Secondary | ICD-10-CM | POA: Diagnosis not present

## 2020-01-30 DIAGNOSIS — N2581 Secondary hyperparathyroidism of renal origin: Secondary | ICD-10-CM | POA: Diagnosis not present

## 2020-01-30 DIAGNOSIS — D509 Iron deficiency anemia, unspecified: Secondary | ICD-10-CM | POA: Diagnosis not present

## 2020-01-30 DIAGNOSIS — N186 End stage renal disease: Secondary | ICD-10-CM | POA: Diagnosis not present

## 2020-01-31 DIAGNOSIS — E1129 Type 2 diabetes mellitus with other diabetic kidney complication: Secondary | ICD-10-CM | POA: Diagnosis not present

## 2020-01-31 DIAGNOSIS — Z1159 Encounter for screening for other viral diseases: Secondary | ICD-10-CM | POA: Diagnosis not present

## 2020-02-01 DIAGNOSIS — D509 Iron deficiency anemia, unspecified: Secondary | ICD-10-CM | POA: Diagnosis not present

## 2020-02-01 DIAGNOSIS — N186 End stage renal disease: Secondary | ICD-10-CM | POA: Diagnosis not present

## 2020-02-01 DIAGNOSIS — Z992 Dependence on renal dialysis: Secondary | ICD-10-CM | POA: Diagnosis not present

## 2020-02-01 DIAGNOSIS — N2581 Secondary hyperparathyroidism of renal origin: Secondary | ICD-10-CM | POA: Diagnosis not present

## 2020-02-01 DIAGNOSIS — D631 Anemia in chronic kidney disease: Secondary | ICD-10-CM | POA: Diagnosis not present

## 2020-02-03 DIAGNOSIS — N186 End stage renal disease: Secondary | ICD-10-CM | POA: Diagnosis not present

## 2020-02-03 DIAGNOSIS — D509 Iron deficiency anemia, unspecified: Secondary | ICD-10-CM | POA: Diagnosis not present

## 2020-02-03 DIAGNOSIS — N2581 Secondary hyperparathyroidism of renal origin: Secondary | ICD-10-CM | POA: Diagnosis not present

## 2020-02-03 DIAGNOSIS — D631 Anemia in chronic kidney disease: Secondary | ICD-10-CM | POA: Diagnosis not present

## 2020-02-03 DIAGNOSIS — Z992 Dependence on renal dialysis: Secondary | ICD-10-CM | POA: Diagnosis not present

## 2020-02-06 DIAGNOSIS — N2581 Secondary hyperparathyroidism of renal origin: Secondary | ICD-10-CM | POA: Diagnosis not present

## 2020-02-06 DIAGNOSIS — N186 End stage renal disease: Secondary | ICD-10-CM | POA: Diagnosis not present

## 2020-02-06 DIAGNOSIS — E119 Type 2 diabetes mellitus without complications: Secondary | ICD-10-CM | POA: Diagnosis not present

## 2020-02-06 DIAGNOSIS — D509 Iron deficiency anemia, unspecified: Secondary | ICD-10-CM | POA: Diagnosis not present

## 2020-02-06 DIAGNOSIS — D631 Anemia in chronic kidney disease: Secondary | ICD-10-CM | POA: Diagnosis not present

## 2020-02-06 DIAGNOSIS — Z992 Dependence on renal dialysis: Secondary | ICD-10-CM | POA: Diagnosis not present

## 2020-02-07 ENCOUNTER — Non-Acute Institutional Stay (SKILLED_NURSING_FACILITY): Payer: Medicare Other | Admitting: Adult Health

## 2020-02-07 ENCOUNTER — Encounter: Payer: Self-pay | Admitting: Adult Health

## 2020-02-07 DIAGNOSIS — K219 Gastro-esophageal reflux disease without esophagitis: Secondary | ICD-10-CM | POA: Diagnosis not present

## 2020-02-07 DIAGNOSIS — R600 Localized edema: Secondary | ICD-10-CM | POA: Diagnosis not present

## 2020-02-07 DIAGNOSIS — I951 Orthostatic hypotension: Secondary | ICD-10-CM | POA: Diagnosis not present

## 2020-02-07 NOTE — Progress Notes (Signed)
Location:    [place] Room Number: 112/D Place of Service:  SNF (31)   CODE STATUS: Full Code  Allergies  Allergen Reactions  . No Known Allergies     Chief Complaint  Patient presents with  . Medical Management of Chronic Issues        Bilateral lower extremity edema:   Orthostatic hypotension: GERD without esophagitis:    HPI:  He is a 74 year old long term resident of this facility being seen for the management of his chronic illnesses: edema; hypotension; gerd. There are no reports of uncontrolled pain; no changes in appetite; no reports of insomnia or anxiety.    Past Medical History:  Diagnosis Date  . Abnormal CT scan, kidney 10/06/2011  . Acute pyelonephritis 10/07/2011  . Anemia    normocytic  . Anxiety    mental retardation  . Bladder wall thickening 10/06/2011  . BPH (benign prostatic hypertrophy)   . Diabetes mellitus   . Dialysis patient [facility])    Tuesday, Thursday and Saturday,   . DVT of leg (deep venous thrombosis) ([place]) 12/25/2016  . Edema     history of lower extremity edema  . GERD (gastroesophageal reflux disease)   . Heme positive stool   . Hydronephrosis   . Hyperkalemia   . Hyperlipidemia   . Hypernatremia   . Hypertension   . Hypothyroidism   . Impaired speech   . 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 MR (mental retardation)   . Muscle weakness   . Obstructive uropathy   . 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 Poor historian poor historian  . Protein calorie malnutrition ([place])   . Pyelonephritis   . Renal failure (ARF), acute on chronic (HCC)   . Renal insufficiency    chronic history  . Sepsis ([place])   . Smoking   . Uremia   . Urinary retention   . UTI (lower urinary tract infection) 10/06/2011    Past Surgical History:  Procedure Laterality Date  . A/V FISTULAGRAM N/A 08/13/2018   Procedure: A/V FISTULAGRAM - Right Upper;  Surgeon: Elam Dutch, MD;  Location: [place] [facility];  Service:  Cardiovascular;  Laterality: N/A;  . A/V FISTULAGRAM N/A 11/22/2018   Procedure: A/V FISTULAGRAM - Right Upper;  Surgeon: Waynetta Sandy, MD;  Location: [place] [facility];  Service: Cardiovascular;  Laterality: N/A;  . AV FISTULA PLACEMENT Left 07/06/2015   Procedure:  INSERTION LEFT ARM ARTERIOVENOUS GORTEX GRAFT;  Surgeon: Angelia Mould, MD;  Location: [place];  Service: Vascular;  Laterality: Left;  . AV FISTULA PLACEMENT Right 02/26/2016   Procedure: ARTERIOVENOUS (AV) FISTULA CREATION ;  Surgeon: Angelia Mould, MD;  Location: [place];  Service: Vascular;  Laterality: Right;  . AV FISTULA PLACEMENT Right 11/25/2018   Procedure: INSERTION OF ARTERIOVENOUS (AV) ARTEGRAFT RIGHT UPPER ARM;  Surgeon: Waynetta Sandy, MD;  Location: [place];  Service: Vascular;  Laterality: Right;  . [place] REMOVAL Left 10/09/2015   Procedure: REMOVAL OF ARTERIOVENOUS GORETEX GRAFT ([place]) Evacuation of Lymphocele, Vein Patch angioplasty of brachial artery.;  Surgeon: Angelia Mould, MD;  Location: [place];  Service: Vascular;  Laterality: Left;  . BASCILIC VEIN TRANSPOSITION Right 02/26/2016   Procedure: Right BASCILIC VEIN TRANSPOSITION;  Surgeon: Angelia Mould, MD;  Location: [place];  Service: Vascular;  Laterality: Right;  . CIRCUMCISION N/A 01/04/2014   Procedure: CIRCUMCISION ADULT (procedure #1);  Surgeon: Marissa Nestle, MD;  [facility];  Service: Urology;  Laterality: N/A;  . COLECTOMY N/A 05/04/2017   Procedure: TOTAL COLECTOMY;  Surgeon: Aviva Signs, MD;  Location: [facility];  Service: General;  Laterality: N/A;  . COLONOSCOPY N/A 04/27/2017   Procedure: COLONOSCOPY;  Surgeon: Daneil Dolin, MD;  [facility];  Service: Endoscopy;  Laterality: N/A;  245  . CYSTOSCOPY W/ RETROGRADES Bilateral 06/29/2015   Procedure: CYSTOSCOPY, DILATION OF URETHRAL STRICTURE WITH BILATERAL RETROGRADE PYELOGRAM,SUPRAPUBIC TUBE CHANGE;  Surgeon: Festus Aloe, MD;  Location:  [facility];  Service: Urology;  Laterality: Bilateral;  . CYSTOSCOPY WITH URETHRAL DILATATION N/A 12/29/2013   Procedure: CYSTOSCOPY WITH URETHRAL DILATATION;  Surgeon: Marissa Nestle, MD;  [facility]: [facility];  Service: Urology;  Laterality: N/A;  . ESOPHAGOGASTRODUODENOSCOPY N/A 04/27/2017   Procedure: ESOPHAGOGASTRODUODENOSCOPY (EGD);  Surgeon: Daneil Dolin, MD;  [facility];  Service: Endoscopy;  Laterality: N/A;  . INSERTION OF DIALYSIS CATHETER Right 11/25/2018   Procedure: INSERTION OF DIALYSIS CATHETER RIGHT INTERNAL JUGULAR;  Surgeon: Waynetta Sandy, MD;  Location: [place];  Service: Vascular;  Laterality: Right;  . IR AV DIALY SHUNT INTRO [place] W/PTA/IMG RIGHT Right 09/07/2018  . IR REMOVAL TUN CV CATH W/O FL  01/12/2019  . IR THROMBECTOMY AV FISTULA W/THROMBOLYSIS/PTA INC/SHUNT/IMG RIGHT Right 04/26/2018  . IR US GUIDE VASC ACCESS RIGHT  04/26/2018  . IR US GUIDE VASC ACCESS RIGHT  09/07/2018  . ORIF FEMUR FRACTURE Right 11/22/2016   Procedure: OPEN REDUCTION INTERNAL FIXATION (ORIF) DISTAL FEMUR FRACTURE;  Surgeon: Rod Can, MD;  Location: [place];  Service: Orthopedics;  Laterality: Right;  . PATCH ANGIOPLASTY Right 12/10/2017   Procedure: PATCH ANGIOPLASTY;  Surgeon: Angelia Mould, MD;  [facility];  Service: Vascular;  Laterality: Right;  . PERIPHERAL VASCULAR BALLOON ANGIOPLASTY  08/13/2018   Procedure: PERIPHERAL VASCULAR BALLOON ANGIOPLASTY;  Surgeon: Elam Dutch, MD;  Location: [place] [facility];  Service: Cardiovascular;;  right AV fistula   . PERIPHERAL VASCULAR BALLOON ANGIOPLASTY  11/22/2018   Procedure: PERIPHERAL VASCULAR BALLOON ANGIOPLASTY;  Surgeon: Waynetta Sandy, MD;  Location: [place] [facility];  Service: Cardiovascular;;  rt AV fistula  . PERIPHERAL VASCULAR CATHETERIZATION N/A 10/08/2015   Procedure: A/V Shuntogram;  Surgeon: Angelia Mould, MD;  Location: [place] [facility];  Service:  Cardiovascular;  Laterality: N/A;  . THROMBECTOMY W/ EMBOLECTOMY Right 12/10/2017   Procedure: THROMBECTOMY REVISION RIGHT ARM  ARTERIOVENOUS FISTULA;  Surgeon: Angelia Mould, MD;  Location: [place];  Service: Vascular;  Laterality: Right;  . TRANSURETHRAL RESECTION OF PROSTATE N/A 01/04/2014   Procedure: TRANSURETHRAL RESECTION OF THE PROSTATE (TURP) (procedure #2);  Surgeon: Marissa Nestle, MD;  [facility];  Service: Urology;  Laterality: N/A;    Social History   Socioeconomic History  . Marital status: Single    Spouse name: Not on file  . Number of children: Not on file  . Years of education: Not on file  . Highest education level: Not on file  Occupational History  . Occupation: retired   Tobacco Use  . Smoking status: Never Smoker  . Smokeless tobacco: Never Used  Substance and Sexual Activity  . Alcohol use: No  . Drug use: No  . Sexual activity: Not Currently  Other Topics Concern  . 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   Long term resident of [facility]    Social Determinants of Health  Financial Resource Strain:   . Difficulty of Paying Living Expenses:   Food Insecurity:   . Worried About Charity fundraiser in the Last Year:   . Arboriculturist in the Last Year:   Transportation Needs:   . Film/video editor (Medical):   Marland Kitchen Lack of Transportation (Non-Medical):   Physical Activity: Unknown  . Days of Exercise per Week: Not on file  . Minutes of Exercise per Session: 0 min  Stress:   . Feeling of Stress :   Social Connections: Unknown  . Frequency of Communication with Friends and Family: Not on file  . Frequency of Social Gatherings with Friends and Family: Never  . Attends Religious Services: Not on file  . Active Member of Clubs or Organizations: Not on file  . Attends Archivist Meetings: Not on file  . Marital Status: Not on file  Intimate Partner  Violence:   . Fear of Current or Ex-Partner:   . Emotionally Abused:   Marland Kitchen Physically Abused:   . Sexually Abused:    Family History  Problem Relation Age of Onset  . Cancer Mother   . Colon cancer Neg Hx       VITAL SIGNS BP 112/62   Pulse 60   Temp 98 F (36.7 C) (Oral)   Resp 18   Ht 5\' 8"  (1.727 m)   Wt 215 lb 12.8 oz (97.9 kg)   SpO2 98%   BMI 32.81 kg/m   Outpatient Encounter Medications as of 02/07/2020  Medication Sig  . atorvastatin (LIPITOR) 80 MG tablet Take 80 mg by mouth every evening.  . insulin aspart (NOVOLOG FLEXPEN) 100 UNIT/ML FlexPen Inject 8 Units into the skin 3 (three) times daily with meals. Special Instructions: on dialysis days Monday,wednesday and Friday. 8 units if cbg above 150 on non dialysis days  . insulin aspart (NOVOLOG FLEXPEN) 100 UNIT/ML FlexPen insulin pen; 100 unit/mL (3 mL); amt: 8 units if above 200; subcutaneous Special Instructions: on dialysis days Monday,wednesday and Friday. With Meals on Mon, Wed, Fri 09:00 AM, 12:00 PM, 06:00 PM  . Insulin Glargine (BASAGLAR KWIKPEN) 100 UNIT/ML SOPN Inject 30 Units into the skin at bedtime.   Marland Kitchen JANUVIA 25 MG tablet Take 25 mg by mouth daily.   Marland Kitchen levothyroxine (SYNTHROID, LEVOTHROID) 88 MCG tablet Take 88 mcg by mouth daily before breakfast.  . midodrine (PROAMATINE) 10 MG tablet Take 10 mg by mouth. Send med with resident on Mon/Wed/Fri to dialysis. do not give at [facility] give to resident to take with him. send with 5mg  to equal 15mg .  . midodrine (PROAMATINE) 5 MG tablet Take 5 mg by mouth. Send with resident on dialysis days.  Mon, Wed, Fri.  Take along with 10 mg to equal 15 mg  . NON FORMULARY Fluid Restriction - 1200 cc a day 500 cc for days, Evenings 500 cc, Night 200 cc  . NON FORMULARY Diet Type:  NAS, Cons CHO Diet  . Nutritional Supplements (NEPRO/CARBSTEADY PO) Take 1 Can by mouth 2 (two) times daily.  Marland Kitchen omega-3 acid ethyl esters (LOVAZA) 1 g capsule 1 gram; amt: 2 capsules; oral    Special Instructions: elevated triglycerides At Bedtime 09:00 PM  . omeprazole (PRILOSEC) 40 MG capsule Take 40 mg by mouth at bedtime.   . polyethylene glycol (MIRALAX / GLYCOLAX) packet Take 17 g by mouth as needed for mild constipation.   . sertraline (ZOLOFT) 50 MG tablet Take 25 mg by mouth daily.   Marland Kitchen  sevelamer carbonate (RENVELA) 800 MG tablet Take 1,600 mg by mouth 3 (three) times daily.   . sevelamer carbonate (RENVELA) 800 MG tablet Take 800 mg by mouth daily. With snacks  . tamsulosin (FLOMAX) 0.4 MG CAPS capsule Take 0.4 mg by mouth every evening. Give 30 minutes after a meal  . torsemide (DEMADEX) 10 MG tablet Take 5 tablets (50 mg total) by mouth daily.   No facility-administered encounter medications on file as of 02/07/2020.     SIGNIFICANT DIAGNOSTIC EXAMS    LABS REVIEWED PREVIOUS;  05-10-19 hgb a1c 7.6   06-03-19: wbc 6.3; hgb 10.8; hct 33.4; mcv 106.7; plt 132; glucose 160; bun 30; creat 8.46; k+ 4.6; na++ 137; ca 9.1; liver normal albumin 3.6; chol 115; LDL 25.5; trig 531; hdl 24; PSA 2.73 09-07-19: hgb a1c 7.5 09-16-19: wbc 8.0; hgb 16.4; hct 50.6; mcv 107.2 plt 141; glucose 165; bun 40; creat 10.47; k+ 3.7; na++ 135; ca 9.3; liver normal albumin 4.6; stool guaiac neg   TODAY  12-09-19: hgb a1c 6.9; hocl 94; ldl 4 trig 339; hdl 22 tsh 3.202     Review of Systems  Constitutional: Negative for malaise/fatigue.  Respiratory: Negative for cough and shortness of breath.   Cardiovascular: Negative for chest pain, palpitations and leg swelling.  Gastrointestinal: Negative for abdominal pain, constipation and heartburn.  Musculoskeletal: Negative for back pain, joint pain and myalgias.  Skin: Negative.   Neurological: Negative for dizziness.  Psychiatric/Behavioral: The patient is not nervous/anxious.    Physical Exam Constitutional:      General: He is not in acute distress.    Appearance: He is well-developed. He is not diaphoretic.  Neck:     Thyroid: No  thyromegaly.  Cardiovascular:     Rate and Rhythm: Normal rate and regular rhythm.     Pulses: Normal pulses.     Heart sounds: Normal heart sounds.  Pulmonary:     Effort: Pulmonary effort is normal. No respiratory distress.     Breath sounds: Normal breath sounds.  Abdominal:     General: Bowel sounds are normal. There is no distension.     Palpations: Abdomen is soft.     Tenderness: There is no abdominal tenderness.  Genitourinary:    Comments: History of turp Musculoskeletal:     Cervical back: Neck supple.     Right lower leg: Edema present.     Left lower leg: Edema present.     Comments: Is able to move all extremities History of right femur ORIF    Trace bilateral lower extremity edema   Lymphadenopathy:     Cervical: No cervical adenopathy.  Skin:    General: Skin is warm and dry.     Comments: Right upper extremity a/v fistula + thrill + bruit  Neurological:     Mental Status: He is alert. Mental status is at baseline.  Psychiatric:        Mood and Affect: Mood normal.       ASSESSMENT/ PLAN:  TODAY  1. Bilateral lower extremity edema: is stable will continue demadex 50 mg daily   2. Orthostatic hypotension: is stable 112/62 will continue midodrine 15 mg with dialysis  3. GERD without esophagitis: is stable will continue prilosec 40 mg daily   PREVIOUS   4. 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 Dyslipidemia associated with type 2 diabetes mellitus is stable trig 339; will continue lipitor 80 mg daily and lovaza 2 gm daily   6. End stage renal disease on hemodialysis due to type 2 diabetes mellitus: is stable will continue dialysis 3 days weekly followed by nephrology; will continue midodrine 15 mg with dialysis treatments 1200 cc fluid restriction; renvela 1600 mg with meals and 800 mg with snacks  7.  Other specified hypothyroidism: is stable tsh 3.202 will continue synthroid 88 mcg daily   8. Anemia of chronic disease on chronic dialysis is stable hgb 10.8; will monitor   9. Major depression single episode moderate: is stable will continue zoloft 50 mg daily will monitor       MD is aware of resident's narcotic use and is in agreement with current plan of care. We will attempt to wean resident as appropriate.  Ok Edwards NP [facility]  Contact [phone] Monday through Friday 8am- 5pm  After hours call [phone]

## 2020-02-08 DIAGNOSIS — N186 End stage renal disease: Secondary | ICD-10-CM | POA: Diagnosis not present

## 2020-02-08 DIAGNOSIS — D509 Iron deficiency anemia, unspecified: Secondary | ICD-10-CM | POA: Diagnosis not present

## 2020-02-08 DIAGNOSIS — N2581 Secondary hyperparathyroidism of renal origin: Secondary | ICD-10-CM | POA: Diagnosis not present

## 2020-02-08 DIAGNOSIS — D631 Anemia in chronic kidney disease: Secondary | ICD-10-CM | POA: Diagnosis not present

## 2020-02-08 DIAGNOSIS — Z992 Dependence on renal dialysis: Secondary | ICD-10-CM | POA: Diagnosis not present

## 2020-02-10 DIAGNOSIS — D509 Iron deficiency anemia, unspecified: Secondary | ICD-10-CM | POA: Diagnosis not present

## 2020-02-10 DIAGNOSIS — Z992 Dependence on renal dialysis: Secondary | ICD-10-CM | POA: Diagnosis not present

## 2020-02-10 DIAGNOSIS — N2581 Secondary hyperparathyroidism of renal origin: Secondary | ICD-10-CM | POA: Diagnosis not present

## 2020-02-10 DIAGNOSIS — D631 Anemia in chronic kidney disease: Secondary | ICD-10-CM | POA: Diagnosis not present

## 2020-02-10 DIAGNOSIS — N186 End stage renal disease: Secondary | ICD-10-CM | POA: Diagnosis not present

## 2020-02-13 DIAGNOSIS — D509 Iron deficiency anemia, unspecified: Secondary | ICD-10-CM | POA: Diagnosis not present

## 2020-02-13 DIAGNOSIS — N2581 Secondary hyperparathyroidism of renal origin: Secondary | ICD-10-CM | POA: Diagnosis not present

## 2020-02-13 DIAGNOSIS — D631 Anemia in chronic kidney disease: Secondary | ICD-10-CM | POA: Diagnosis not present

## 2020-02-13 DIAGNOSIS — N186 End stage renal disease: Secondary | ICD-10-CM | POA: Diagnosis not present

## 2020-02-13 DIAGNOSIS — Z992 Dependence on renal dialysis: Secondary | ICD-10-CM | POA: Diagnosis not present

## 2020-02-15 DIAGNOSIS — Z992 Dependence on renal dialysis: Secondary | ICD-10-CM | POA: Diagnosis not present

## 2020-02-15 DIAGNOSIS — N186 End stage renal disease: Secondary | ICD-10-CM | POA: Diagnosis not present

## 2020-02-15 DIAGNOSIS — D509 Iron deficiency anemia, unspecified: Secondary | ICD-10-CM | POA: Diagnosis not present

## 2020-02-15 DIAGNOSIS — D631 Anemia in chronic kidney disease: Secondary | ICD-10-CM | POA: Diagnosis not present

## 2020-02-15 DIAGNOSIS — N2581 Secondary hyperparathyroidism of renal origin: Secondary | ICD-10-CM | POA: Diagnosis not present

## 2020-02-17 DIAGNOSIS — N186 End stage renal disease: Secondary | ICD-10-CM | POA: Diagnosis not present

## 2020-02-17 DIAGNOSIS — D631 Anemia in chronic kidney disease: Secondary | ICD-10-CM | POA: Diagnosis not present

## 2020-02-17 DIAGNOSIS — D509 Iron deficiency anemia, unspecified: Secondary | ICD-10-CM | POA: Diagnosis not present

## 2020-02-17 DIAGNOSIS — Z992 Dependence on renal dialysis: Secondary | ICD-10-CM | POA: Diagnosis not present

## 2020-02-17 DIAGNOSIS — N2581 Secondary hyperparathyroidism of renal origin: Secondary | ICD-10-CM | POA: Diagnosis not present

## 2020-02-20 DIAGNOSIS — N2581 Secondary hyperparathyroidism of renal origin: Secondary | ICD-10-CM | POA: Diagnosis not present

## 2020-02-20 DIAGNOSIS — Z992 Dependence on renal dialysis: Secondary | ICD-10-CM | POA: Diagnosis not present

## 2020-02-20 DIAGNOSIS — D509 Iron deficiency anemia, unspecified: Secondary | ICD-10-CM | POA: Diagnosis not present

## 2020-02-20 DIAGNOSIS — D631 Anemia in chronic kidney disease: Secondary | ICD-10-CM | POA: Diagnosis not present

## 2020-02-20 DIAGNOSIS — N186 End stage renal disease: Secondary | ICD-10-CM | POA: Diagnosis not present

## 2020-02-22 DIAGNOSIS — D631 Anemia in chronic kidney disease: Secondary | ICD-10-CM | POA: Diagnosis not present

## 2020-02-22 DIAGNOSIS — D509 Iron deficiency anemia, unspecified: Secondary | ICD-10-CM | POA: Diagnosis not present

## 2020-02-22 DIAGNOSIS — E1129 Type 2 diabetes mellitus with other diabetic kidney complication: Secondary | ICD-10-CM | POA: Diagnosis not present

## 2020-02-22 DIAGNOSIS — Z1159 Encounter for screening for other viral diseases: Secondary | ICD-10-CM | POA: Diagnosis not present

## 2020-02-22 DIAGNOSIS — Z992 Dependence on renal dialysis: Secondary | ICD-10-CM | POA: Diagnosis not present

## 2020-02-22 DIAGNOSIS — N186 End stage renal disease: Secondary | ICD-10-CM | POA: Diagnosis not present

## 2020-02-22 DIAGNOSIS — N2581 Secondary hyperparathyroidism of renal origin: Secondary | ICD-10-CM | POA: Diagnosis not present

## 2020-02-23 DIAGNOSIS — E1140 Type 2 diabetes mellitus with diabetic neuropathy, unspecified: Secondary | ICD-10-CM | POA: Diagnosis not present

## 2020-02-23 DIAGNOSIS — Z794 Long term (current) use of insulin: Secondary | ICD-10-CM | POA: Diagnosis not present

## 2020-02-23 DIAGNOSIS — B351 Tinea unguium: Secondary | ICD-10-CM | POA: Diagnosis not present

## 2020-02-23 DIAGNOSIS — E1151 Type 2 diabetes mellitus with diabetic peripheral angiopathy without gangrene: Secondary | ICD-10-CM | POA: Diagnosis not present

## 2020-02-24 DIAGNOSIS — Z992 Dependence on renal dialysis: Secondary | ICD-10-CM | POA: Diagnosis not present

## 2020-02-24 DIAGNOSIS — D509 Iron deficiency anemia, unspecified: Secondary | ICD-10-CM | POA: Diagnosis not present

## 2020-02-24 DIAGNOSIS — N186 End stage renal disease: Secondary | ICD-10-CM | POA: Diagnosis not present

## 2020-02-24 DIAGNOSIS — N2581 Secondary hyperparathyroidism of renal origin: Secondary | ICD-10-CM | POA: Diagnosis not present

## 2020-02-24 DIAGNOSIS — D631 Anemia in chronic kidney disease: Secondary | ICD-10-CM | POA: Diagnosis not present

## 2020-02-27 DIAGNOSIS — N2581 Secondary hyperparathyroidism of renal origin: Secondary | ICD-10-CM | POA: Diagnosis not present

## 2020-02-27 DIAGNOSIS — Z992 Dependence on renal dialysis: Secondary | ICD-10-CM | POA: Diagnosis not present

## 2020-02-27 DIAGNOSIS — T82898A Other specified complication of vascular prosthetic devices, implants and grafts, initial encounter: Secondary | ICD-10-CM | POA: Diagnosis not present

## 2020-02-27 DIAGNOSIS — D631 Anemia in chronic kidney disease: Secondary | ICD-10-CM | POA: Diagnosis not present

## 2020-02-27 DIAGNOSIS — N186 End stage renal disease: Secondary | ICD-10-CM | POA: Diagnosis not present

## 2020-02-29 DIAGNOSIS — Z992 Dependence on renal dialysis: Secondary | ICD-10-CM | POA: Diagnosis not present

## 2020-02-29 DIAGNOSIS — N186 End stage renal disease: Secondary | ICD-10-CM | POA: Diagnosis not present

## 2020-02-29 DIAGNOSIS — D631 Anemia in chronic kidney disease: Secondary | ICD-10-CM | POA: Diagnosis not present

## 2020-02-29 DIAGNOSIS — N2581 Secondary hyperparathyroidism of renal origin: Secondary | ICD-10-CM | POA: Diagnosis not present

## 2020-02-29 DIAGNOSIS — T82898A Other specified complication of vascular prosthetic devices, implants and grafts, initial encounter: Secondary | ICD-10-CM | POA: Diagnosis not present

## 2020-03-05 DIAGNOSIS — I871 Compression of vein: Secondary | ICD-10-CM | POA: Diagnosis not present

## 2020-03-05 DIAGNOSIS — D631 Anemia in chronic kidney disease: Secondary | ICD-10-CM | POA: Diagnosis not present

## 2020-03-05 DIAGNOSIS — N186 End stage renal disease: Secondary | ICD-10-CM | POA: Diagnosis not present

## 2020-03-05 DIAGNOSIS — T82868A Thrombosis of vascular prosthetic devices, implants and grafts, initial encounter: Secondary | ICD-10-CM | POA: Diagnosis not present

## 2020-03-05 DIAGNOSIS — T82898A Other specified complication of vascular prosthetic devices, implants and grafts, initial encounter: Secondary | ICD-10-CM | POA: Diagnosis not present

## 2020-03-05 DIAGNOSIS — N2581 Secondary hyperparathyroidism of renal origin: Secondary | ICD-10-CM | POA: Diagnosis not present

## 2020-03-05 DIAGNOSIS — Z992 Dependence on renal dialysis: Secondary | ICD-10-CM | POA: Diagnosis not present

## 2020-03-07 DIAGNOSIS — D631 Anemia in chronic kidney disease: Secondary | ICD-10-CM | POA: Diagnosis not present

## 2020-03-07 DIAGNOSIS — N186 End stage renal disease: Secondary | ICD-10-CM | POA: Diagnosis not present

## 2020-03-07 DIAGNOSIS — T82898A Other specified complication of vascular prosthetic devices, implants and grafts, initial encounter: Secondary | ICD-10-CM | POA: Diagnosis not present

## 2020-03-07 DIAGNOSIS — N2581 Secondary hyperparathyroidism of renal origin: Secondary | ICD-10-CM | POA: Diagnosis not present

## 2020-03-07 DIAGNOSIS — Z992 Dependence on renal dialysis: Secondary | ICD-10-CM | POA: Diagnosis not present

## 2020-03-09 ENCOUNTER — Non-Acute Institutional Stay (SKILLED_NURSING_FACILITY): Payer: Medicare Other | Admitting: Adult Health

## 2020-03-09 ENCOUNTER — Encounter: Payer: Self-pay | Admitting: Adult Health

## 2020-03-09 DIAGNOSIS — D631 Anemia in chronic kidney disease: Secondary | ICD-10-CM | POA: Diagnosis not present

## 2020-03-09 DIAGNOSIS — N186 End stage renal disease: Secondary | ICD-10-CM

## 2020-03-09 DIAGNOSIS — N2581 Secondary hyperparathyroidism of renal origin: Secondary | ICD-10-CM | POA: Diagnosis not present

## 2020-03-09 DIAGNOSIS — I120 Hypertensive chronic kidney disease with stage 5 chronic kidney disease or end stage renal disease: Secondary | ICD-10-CM | POA: Diagnosis not present

## 2020-03-09 DIAGNOSIS — E785 Hyperlipidemia, unspecified: Secondary | ICD-10-CM | POA: Diagnosis not present

## 2020-03-09 DIAGNOSIS — E1122 Type 2 diabetes mellitus with diabetic chronic kidney disease: Secondary | ICD-10-CM

## 2020-03-09 DIAGNOSIS — E1169 Type 2 diabetes mellitus with other specified complication: Secondary | ICD-10-CM | POA: Diagnosis not present

## 2020-03-09 DIAGNOSIS — Z992 Dependence on renal dialysis: Secondary | ICD-10-CM

## 2020-03-09 DIAGNOSIS — F70 Mild intellectual disabilities: Secondary | ICD-10-CM | POA: Diagnosis not present

## 2020-03-09 DIAGNOSIS — F4322 Adjustment disorder with anxiety: Secondary | ICD-10-CM | POA: Diagnosis not present

## 2020-03-09 DIAGNOSIS — T82898A Other specified complication of vascular prosthetic devices, implants and grafts, initial encounter: Secondary | ICD-10-CM | POA: Diagnosis not present

## 2020-03-09 NOTE — Progress Notes (Signed)
Location:    [facility] Room Number: 112/D Place of Service:  SNF (31)   CODE STATUS: Full Code  Allergies  Allergen Reactions  . No Known Allergies     Chief Complaint  Patient presents with  . 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    Past Medical History:  Diagnosis Date  . Abnormal CT scan, kidney 10/06/2011  . Acute pyelonephritis 10/07/2011  . Anemia    normocytic  . Anxiety    mental retardation  . Bladder wall thickening 10/06/2011  . BPH (benign prostatic hypertrophy)   . Diabetes mellitus   . Dialysis patient [facility])    Tuesday, Thursday and Saturday,   . DVT of leg (deep venous thrombosis) ([place]) 12/25/2016  . Edema     history of lower extremity edema  . GERD (gastroesophageal reflux disease)   . Heme positive stool   . Hydronephrosis   . Hyperkalemia   . Hyperlipidemia   . Hypernatremia   . Hypertension   . Hypothyroidism   . Impaired speech   . 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 MR (mental retardation)   . Muscle weakness   . Obstructive uropathy   . 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 Poor historian poor historian  . Protein calorie malnutrition ([place])   . Pyelonephritis   . Renal failure (ARF), acute on chronic (HCC)   . Renal insufficiency    chronic history  . Sepsis ([place])   . Smoking   . Uremia   . Urinary retention   . UTI (lower urinary tract infection) 10/06/2011    Past Surgical History:  Procedure Laterality Date  . A/V FISTULAGRAM N/A 08/13/2018    Procedure: A/V FISTULAGRAM - Right Upper;  Surgeon: Elam Dutch, MD;  Location: [place] CV LAB;  Service: Cardiovascular;  Laterality: N/A;  . A/V FISTULAGRAM N/A 11/22/2018   Procedure: A/V FISTULAGRAM - Right Upper;  Surgeon: Waynetta Sandy, MD;  Location: [place] CV [facility];  Service: Cardiovascular;  Laterality: N/A;  . AV FISTULA PLACEMENT Left 07/06/2015   Procedure:  INSERTION LEFT ARM ARTERIOVENOUS GORTEX GRAFT;  Surgeon: Angelia Mould, MD;  Location: [place];  Service: Vascular;  Laterality: Left;  . AV FISTULA PLACEMENT Right 02/26/2016   Procedure: ARTERIOVENOUS (AV) FISTULA CREATION ;  Surgeon: Angelia Mould, MD;  Location: [place];  Service: Vascular;  Laterality: Right;  . AV FISTULA PLACEMENT Right 11/25/2018   Procedure: INSERTION OF ARTERIOVENOUS (AV) ARTEGRAFT RIGHT UPPER ARM;  Surgeon: Waynetta Sandy, MD;  Location: [place];  Service: Vascular;  Laterality: Right;  . [place] REMOVAL Left 10/09/2015   Procedure: REMOVAL OF ARTERIOVENOUS GORETEX GRAFT ([place]) Evacuation of Lymphocele, Vein Patch angioplasty of brachial artery.;  Surgeon: Angelia Mould, MD;  Location: [place];  Service: Vascular;  Laterality: Left;  . BASCILIC VEIN TRANSPOSITION Right 02/26/2016   Procedure: Right BASCILIC VEIN TRANSPOSITION;  Surgeon: Angelia Mould, MD;  Location: [place];  Service: Vascular;  Laterality: Right;  . CIRCUMCISION N/A 01/04/2014   Procedure: CIRCUMCISION ADULT (procedure #1);  Surgeon: Marissa Nestle, MD;  Location: [facility];  Service: Urology;  Laterality: N/A;  . COLECTOMY N/A 05/04/2017   Procedure: TOTAL COLECTOMY;  Surgeon: Aviva Signs, MD;  Location: [facility];  Service: General;  Laterality: N/A;  . COLONOSCOPY N/A 04/27/2017   Procedure: COLONOSCOPY;  Surgeon: Daneil Dolin, MD;  [facility];  Service: Endoscopy;  Laterality: N/A;  245  . CYSTOSCOPY W/ RETROGRADES Bilateral 06/29/2015   Procedure: CYSTOSCOPY, DILATION OF  URETHRAL STRICTURE WITH BILATERAL RETROGRADE PYELOGRAM,SUPRAPUBIC TUBE CHANGE;  Surgeon: Festus Aloe, MD;  Location: [facility];  Service: Urology;  Laterality: Bilateral;  . CYSTOSCOPY WITH URETHRAL DILATATION N/A 12/29/2013   Procedure: CYSTOSCOPY WITH URETHRAL DILATATION;  Surgeon: Marissa Nestle, MD;  [facility];  Service: Urology;  Laterality: N/A;  . ESOPHAGOGASTRODUODENOSCOPY N/A 04/27/2017   Procedure: ESOPHAGOGASTRODUODENOSCOPY (EGD);  Surgeon: Daneil Dolin, MD;  [facility];  Service: Endoscopy;  Laterality: N/A;  . INSERTION OF DIALYSIS CATHETER Right 11/25/2018   Procedure: INSERTION OF DIALYSIS CATHETER RIGHT INTERNAL JUGULAR;  Surgeon: Waynetta Sandy, MD;  Location: [place];  Service: Vascular;  Laterality: Right;  . IR AV DIALY SHUNT INTRO [place] W/PTA/IMG RIGHT Right 09/07/2018  . IR REMOVAL TUN CV CATH W/O FL  01/12/2019  . IR THROMBECTOMY AV FISTULA W/THROMBOLYSIS/PTA INC/SHUNT/IMG RIGHT Right 04/26/2018  . IR US GUIDE VASC ACCESS RIGHT  04/26/2018  . IR US GUIDE VASC ACCESS RIGHT  09/07/2018  . ORIF FEMUR FRACTURE Right 11/22/2016   Procedure: OPEN REDUCTION INTERNAL FIXATION (ORIF) DISTAL FEMUR FRACTURE;  Surgeon: Rod Can, MD;  Location: [place];  Service: Orthopedics;  Laterality: Right;  . PATCH ANGIOPLASTY Right 12/10/2017   Procedure: PATCH ANGIOPLASTY;  Surgeon: Angelia Mould, MD;  [facility];  Service: Vascular;  Laterality: Right;  . PERIPHERAL VASCULAR BALLOON ANGIOPLASTY  08/13/2018   Procedure: PERIPHERAL VASCULAR BALLOON ANGIOPLASTY;  Surgeon: Elam Dutch, MD;  Location: [place] [facility];  Service: Cardiovascular;;  right AV fistula   . PERIPHERAL VASCULAR BALLOON ANGIOPLASTY  11/22/2018   Procedure: PERIPHERAL VASCULAR BALLOON ANGIOPLASTY;  Surgeon: Waynetta Sandy, MD;  Location: [place] [facility];  Service: Cardiovascular;;  rt AV fistula  . PERIPHERAL VASCULAR CATHETERIZATION N/A  10/08/2015   Procedure: A/V Shuntogram;  Surgeon: Angelia Mould, MD;  Location: [place] [facility];  Service: Cardiovascular;  Laterality: N/A;  . THROMBECTOMY W/ EMBOLECTOMY Right 12/10/2017   Procedure: THROMBECTOMY REVISION RIGHT ARM  ARTERIOVENOUS FISTULA;  Surgeon: Angelia Mould, MD;  Location: [place];  Service: Vascular;  Laterality: Right;  . TRANSURETHRAL RESECTION OF PROSTATE N/A 01/04/2014   Procedure: TRANSURETHRAL RESECTION OF THE PROSTATE (TURP) (procedure #2);  Surgeon: Marissa Nestle, MD;  [facility];  Service: Urology;  Laterality: N/A;    Social History   Socioeconomic History  . Marital status: Single    Spouse name: Not on file  . Number of children: Not on file  . Years of education: Not on file  . Highest education level: Not on file  Occupational History  . Occupation: retired   Tobacco Use  . Smoking status: Never Smoker  . Smokeless tobacco: Never Used  Substance and Sexual Activity  . Alcohol use: No  . Drug use: No  . Sexual activity: Not Currently  Other Topics Concern  . 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   Long term resident of [facility]    Social Determinants of Health   Financial Resource Strain:   . Difficulty of Paying Living Expenses:   Food Insecurity:   . Worried About Charity fundraiser in the Last Year:   . Arboriculturist in the Last Year:   Transportation Needs:   . Film/video editor (Medical):   Marland Kitchen Lack of Transportation (Non-Medical):   Physical Activity: Unknown  . Days of Exercise per Week: Not on file  . Minutes of Exercise per Session: 0 min  Stress:   . Feeling of Stress :   Social Connections: Unknown  . Frequency of Communication with Friends and Family: Not on file  . Frequency of Social Gatherings with Friends and Family: Never  . Attends Religious Services: Not on file  . Active Member of Clubs or Organizations:  Not on file  . Attends Archivist Meetings: Not on file  . Marital Status: Not on file  Intimate Partner Violence:   . Fear of Current or Ex-Partner:   . Emotionally Abused:   Marland Kitchen Physically Abused:   . Sexually Abused:    Family History  Problem Relation Age of Onset  . Cancer Mother   . Colon cancer Neg Hx       VITAL SIGNS BP (!) 94/56   Pulse 72   Temp 97.6 F (36.4 C) (Oral)   Resp 20   Ht 5\' 8"  (1.727 m)   Wt 212 lb (96.2 kg)   SpO2 98%   BMI 32.23 kg/m   Outpatient Encounter Medications as of 03/09/2020  Medication Sig  . atorvastatin (LIPITOR) 80 MG tablet Take 80 mg by mouth every evening.  . insulin aspart (NOVOLOG FLEXPEN) 100 UNIT/ML FlexPen Inject 8 Units into the skin 3 (three) times daily with meals. Special Instructions: on dialysis days Monday,wednesday and Friday. 8 units if cbg above 150 on non dialysis days  . insulin aspart (NOVOLOG FLEXPEN) 100 UNIT/ML FlexPen insulin pen; 100 unit/mL (3 mL); amt: 8 units if above 200; subcutaneous Special Instructions: on dialysis days Monday,wednesday and Friday. With Meals on Mon, Wed, Fri 09:00 AM, 12:00 PM, 06:00 PM  . Insulin Glargine (BASAGLAR KWIKPEN) 100 UNIT/ML SOPN Inject 30 Units into the skin at bedtime.   Marland Kitchen JANUVIA 25 MG tablet Take 25 mg by mouth daily.   Marland Kitchen levothyroxine (SYNTHROID, LEVOTHROID) 88 MCG tablet Take 88 mcg by mouth daily before breakfast.  . midodrine (PROAMATINE) 10 MG tablet Take 10 mg by mouth. Send med with resident on Mon/Wed/Fri to dialysis. do not give at [facility] give to resident to take with him. send with 5mg  to equal 15mg .  . midodrine (PROAMATINE) 5 MG tablet Take 5 mg by mouth. Send with resident on dialysis days.  Mon, Wed, Fri.  Take along with 10 mg to equal 15 mg  . NON FORMULARY Fluid Restriction - 1200 cc a day 500 cc for days, Evenings 500 cc, Night 200 cc  . NON FORMULARY Diet Type:  NAS, Cons CHO Diet  . Nutritional Supplements (NEPRO/CARBSTEADY PO) Take 1  Can by mouth 2 (two) times daily.  Marland Kitchen omega-3 acid ethyl esters (LOVAZA) 1 g capsule 1 gram; amt: 2 capsules; oral  Special Instructions: elevated triglycerides At Bedtime 09:00 PM  . omeprazole (PRILOSEC) 40 MG capsule Take 40 mg by mouth at bedtime.   . polyethylene glycol (MIRALAX / GLYCOLAX) packet Take 17  g by mouth as needed for mild constipation.   . sertraline (ZOLOFT) 50 MG tablet Take 25 mg by mouth daily.   . sevelamer carbonate (RENVELA) 800 MG tablet Take 1,600 mg by mouth 3 (three) times daily.   . sevelamer carbonate (RENVELA) 800 MG tablet Take 800 mg by mouth daily. With snacks  . tamsulosin (FLOMAX) 0.4 MG CAPS capsule Take 0.4 mg by mouth every evening. Give 30 minutes after a meal  . torsemide (DEMADEX) 10 MG tablet Take 5 tablets (50 mg total) by mouth daily.   No facility-administered encounter medications on file as of 03/09/2020.     SIGNIFICANT DIAGNOSTIC EXAMS  LABS REVIEWED PREVIOUS;  05-10-19 hgb a1c 7.6   06-03-19: wbc 6.3; hgb 10.8; hct 33.4; mcv 106.7; plt 132; glucose 160; bun 30; creat 8.46; k+ 4.6; na++ 137; ca 9.1; liver normal albumin 3.6; chol 115; LDL 25.5; trig 531; hdl 24; PSA 2.73 09-07-19: hgb a1c 7.5 09-16-19: wbc 8.0; hgb 16.4; hct 50.6; mcv 107.2 plt 141; glucose 165; bun 40; creat 10.47; k+ 3.7; na++ 135; ca 9.3; liver normal albumin 4.6; stool guaiac neg  12-09-19: hgb a1c 6.9; hocl 94; ldl 4 trig 339; hdl 22 tsh 3.202  NO NEW LABS.      Review of Systems  Constitutional: Negative for malaise/fatigue.  Respiratory: Negative for cough and shortness of breath.   Cardiovascular: Negative for chest pain, palpitations and leg swelling.  Gastrointestinal: Negative for abdominal pain, constipation and heartburn.  Musculoskeletal: Negative for back pain, joint pain and myalgias.  Skin: Negative.   Neurological: Negative for dizziness.  Psychiatric/Behavioral: The patient is not nervous/anxious.     Physical Exam Constitutional:      General:  He is not in acute distress.    Appearance: He is well-developed. He is not diaphoretic.  Neck:     Thyroid: No thyromegaly.  Cardiovascular:     Rate and Rhythm: Normal rate and regular rhythm.     Pulses: Normal pulses.     Heart sounds: Normal heart sounds.  Pulmonary:     Effort: Pulmonary effort is normal. No respiratory distress.     Breath sounds: Normal breath sounds.  Abdominal:     General: Bowel sounds are normal. There is no distension.     Palpations: Abdomen is soft.     Tenderness: There is no abdominal tenderness.  Genitourinary:    Comments:  History of turp Musculoskeletal:     Cervical back: Neck supple.     Right lower leg: No edema.     Left lower leg: No edema.     Comments: : Is able to move all extremities History of right femur ORIF    Trace bilateral lower extremity edema    Lymphadenopathy:     Cervical: No cervical adenopathy.  Skin:    General: Skin is warm and dry.     Comments: Right upper extremity a/v fistula + thrill + bruit   Neurological:     Mental Status: He is alert. Mental status is at baseline.  Psychiatric:        Mood and Affect: Mood normal.       ASSESSMENT/ PLAN:  TODAY  1. 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 Dyslipidemia associated with type 2 diabetes mellitus: is stable trig 339; will continue lipitor 80 mg  daily and lovaza 2 gm daily   3. End stage renal disease on hemodialysis due to type 2 diabetes mellitus is stable will continue dialysis 3 days weekly followed by nephrology will continue midodrine 15 mg with dialysis 1200 cc fluid restriction renvela 1600 mg with meals and 800 mg with snacks.   PREVIOUS   4. Other specified hypothyroidism: is stable tsh 3.202 will continue synthroid 88 mcg daily   5. Anemia of chronic disease on chronic dialysis is stable hgb  10.8; will monitor   6. Major depression single episode moderate: is stable will continue zoloft 25 mg daily will monitor   7. Bilateral lower extremity edema: is stable will continue demadex 50 mg daily   8. Orthostatic hypotension: is stable 94/56 will continue midodrine 15 mg with dialysis  9. GERD without esophagitis: is stable will continue prilosec 40 mg daily      MD is aware of resident's narcotic use and is in agreement with current plan of care. We will attempt to wean resident as appropriate.  Ok Edwards NP [facility]  Contact [phone] Monday through Friday 8am- 5pm  After hours call [phone]

## 2020-03-12 DIAGNOSIS — D631 Anemia in chronic kidney disease: Secondary | ICD-10-CM | POA: Diagnosis not present

## 2020-03-12 DIAGNOSIS — N186 End stage renal disease: Secondary | ICD-10-CM | POA: Diagnosis not present

## 2020-03-12 DIAGNOSIS — T82898A Other specified complication of vascular prosthetic devices, implants and grafts, initial encounter: Secondary | ICD-10-CM | POA: Diagnosis not present

## 2020-03-12 DIAGNOSIS — N2581 Secondary hyperparathyroidism of renal origin: Secondary | ICD-10-CM | POA: Diagnosis not present

## 2020-03-12 DIAGNOSIS — Z992 Dependence on renal dialysis: Secondary | ICD-10-CM | POA: Diagnosis not present

## 2020-03-14 DIAGNOSIS — T82898A Other specified complication of vascular prosthetic devices, implants and grafts, initial encounter: Secondary | ICD-10-CM | POA: Diagnosis not present

## 2020-03-14 DIAGNOSIS — Z992 Dependence on renal dialysis: Secondary | ICD-10-CM | POA: Diagnosis not present

## 2020-03-14 DIAGNOSIS — N186 End stage renal disease: Secondary | ICD-10-CM | POA: Diagnosis not present

## 2020-03-14 DIAGNOSIS — N2581 Secondary hyperparathyroidism of renal origin: Secondary | ICD-10-CM | POA: Diagnosis not present

## 2020-03-14 DIAGNOSIS — D631 Anemia in chronic kidney disease: Secondary | ICD-10-CM | POA: Diagnosis not present

## 2020-03-16 DIAGNOSIS — N2581 Secondary hyperparathyroidism of renal origin: Secondary | ICD-10-CM | POA: Diagnosis not present

## 2020-03-16 DIAGNOSIS — Z992 Dependence on renal dialysis: Secondary | ICD-10-CM | POA: Diagnosis not present

## 2020-03-16 DIAGNOSIS — D631 Anemia in chronic kidney disease: Secondary | ICD-10-CM | POA: Diagnosis not present

## 2020-03-16 DIAGNOSIS — T82898A Other specified complication of vascular prosthetic devices, implants and grafts, initial encounter: Secondary | ICD-10-CM | POA: Diagnosis not present

## 2020-03-16 DIAGNOSIS — N186 End stage renal disease: Secondary | ICD-10-CM | POA: Diagnosis not present

## 2020-03-19 DIAGNOSIS — D631 Anemia in chronic kidney disease: Secondary | ICD-10-CM | POA: Diagnosis not present

## 2020-03-19 DIAGNOSIS — N2581 Secondary hyperparathyroidism of renal origin: Secondary | ICD-10-CM | POA: Diagnosis not present

## 2020-03-19 DIAGNOSIS — T82898A Other specified complication of vascular prosthetic devices, implants and grafts, initial encounter: Secondary | ICD-10-CM | POA: Diagnosis not present

## 2020-03-19 DIAGNOSIS — N186 End stage renal disease: Secondary | ICD-10-CM | POA: Diagnosis not present

## 2020-03-19 DIAGNOSIS — Z992 Dependence on renal dialysis: Secondary | ICD-10-CM | POA: Diagnosis not present

## 2020-03-21 DIAGNOSIS — T82898A Other specified complication of vascular prosthetic devices, implants and grafts, initial encounter: Secondary | ICD-10-CM | POA: Diagnosis not present

## 2020-03-21 DIAGNOSIS — Z992 Dependence on renal dialysis: Secondary | ICD-10-CM | POA: Diagnosis not present

## 2020-03-21 DIAGNOSIS — N186 End stage renal disease: Secondary | ICD-10-CM | POA: Diagnosis not present

## 2020-03-21 DIAGNOSIS — N2581 Secondary hyperparathyroidism of renal origin: Secondary | ICD-10-CM | POA: Diagnosis not present

## 2020-03-21 DIAGNOSIS — D631 Anemia in chronic kidney disease: Secondary | ICD-10-CM | POA: Diagnosis not present

## 2020-03-23 DIAGNOSIS — Z992 Dependence on renal dialysis: Secondary | ICD-10-CM | POA: Diagnosis not present

## 2020-03-23 DIAGNOSIS — N186 End stage renal disease: Secondary | ICD-10-CM | POA: Diagnosis not present

## 2020-03-23 DIAGNOSIS — T82898A Other specified complication of vascular prosthetic devices, implants and grafts, initial encounter: Secondary | ICD-10-CM | POA: Diagnosis not present

## 2020-03-23 DIAGNOSIS — D631 Anemia in chronic kidney disease: Secondary | ICD-10-CM | POA: Diagnosis not present

## 2020-03-23 DIAGNOSIS — N2581 Secondary hyperparathyroidism of renal origin: Secondary | ICD-10-CM | POA: Diagnosis not present

## 2020-03-26 DIAGNOSIS — Z992 Dependence on renal dialysis: Secondary | ICD-10-CM | POA: Diagnosis not present

## 2020-03-26 DIAGNOSIS — D631 Anemia in chronic kidney disease: Secondary | ICD-10-CM | POA: Diagnosis not present

## 2020-03-26 DIAGNOSIS — N2581 Secondary hyperparathyroidism of renal origin: Secondary | ICD-10-CM | POA: Diagnosis not present

## 2020-03-26 DIAGNOSIS — T82898A Other specified complication of vascular prosthetic devices, implants and grafts, initial encounter: Secondary | ICD-10-CM | POA: Diagnosis not present

## 2020-03-26 DIAGNOSIS — N186 End stage renal disease: Secondary | ICD-10-CM | POA: Diagnosis not present

## 2020-03-29 ENCOUNTER — Encounter: Payer: Self-pay | Admitting: Adult Health

## 2020-03-29 ENCOUNTER — Non-Acute Institutional Stay (SKILLED_NURSING_FACILITY): Payer: Medicare Other | Admitting: Adult Health

## 2020-03-29 DIAGNOSIS — N186 End stage renal disease: Secondary | ICD-10-CM

## 2020-03-29 DIAGNOSIS — F79 Unspecified intellectual disabilities: Secondary | ICD-10-CM

## 2020-03-29 DIAGNOSIS — Z992 Dependence on renal dialysis: Secondary | ICD-10-CM

## 2020-03-29 DIAGNOSIS — I120 Hypertensive chronic kidney disease with stage 5 chronic kidney disease or end stage renal disease: Secondary | ICD-10-CM

## 2020-03-29 DIAGNOSIS — E1122 Type 2 diabetes mellitus with diabetic chronic kidney disease: Secondary | ICD-10-CM

## 2020-03-29 DIAGNOSIS — E034 Atrophy of thyroid (acquired): Secondary | ICD-10-CM

## 2020-03-29 NOTE — [place] Notes (Signed)
Location:    [place] Room Number: 112/D Place of Service:  SNF (31)   CODE STATUS: Full Code  Allergies  Allergen Reactions  . No Known Allergies     Chief Complaint  Patient presents with  . Acute Visit    Care Plan Meeting    HPI:  We have come together for his care plan meeting. BIMS 8/15 mood 0/30. There are no reports of falls. His weight is stable he is independent to supervision with his adls. He is occasionally incontinent. There are no reports of uncontrolled pain. 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   Past Medical History:  Diagnosis Date  . Abnormal CT scan, kidney 10/06/2011  . Acute pyelonephritis 10/07/2011  . Anemia    normocytic  . Anxiety    mental retardation  . Bladder wall thickening 10/06/2011  . BPH (benign prostatic hypertrophy)   . Diabetes mellitus   . Dialysis patient [facility])    Tuesday, Thursday and Saturday,   . DVT of leg (deep venous thrombosis) ([place]) 12/25/2016  . Edema     history of lower extremity edema  . GERD (gastroesophageal reflux disease)   . Heme positive stool   . Hydronephrosis   . Hyperkalemia   . Hyperlipidemia   . Hypernatremia   . Hypertension   . Hypothyroidism   . Impaired speech   . 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 MR (mental retardation)   . Muscle weakness   . Obstructive uropathy   . 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 Poor historian poor historian  . Protein calorie malnutrition ([place])   . Pyelonephritis   . Renal failure (ARF), acute on chronic (HCC)   . Renal insufficiency    chronic history  . Sepsis ([place])   . Smoking   . Uremia   . Urinary retention   . UTI (lower urinary tract infection) 10/06/2011    Past Surgical History:  Procedure Laterality Date  . A/V  FISTULAGRAM N/A 08/13/2018   Procedure: A/V FISTULAGRAM - Right Upper;  Surgeon: Elam Dutch, MD;  Location: [place] CV LAB;  Service: Cardiovascular;  Laterality: N/A;  . A/V FISTULAGRAM N/A 11/22/2018   Procedure: A/V FISTULAGRAM - Right Upper;  Surgeon: Waynetta Sandy, MD;  Location: [place] [facility];  Service: Cardiovascular;  Laterality: N/A;  . AV FISTULA PLACEMENT Left 07/06/2015   Procedure:  INSERTION LEFT ARM ARTERIOVENOUS GORTEX GRAFT;  Surgeon: Angelia Mould, MD;  Location: [place];  Service: Vascular;  Laterality: Left;  . AV FISTULA PLACEMENT Right 02/26/2016   Procedure: ARTERIOVENOUS (AV) FISTULA CREATION ;  Surgeon: Angelia Mould, MD;  Location: [place];  Service: Vascular;  Laterality: Right;  . AV FISTULA PLACEMENT Right 11/25/2018   Procedure: INSERTION OF ARTERIOVENOUS (AV) ARTEGRAFT RIGHT UPPER ARM;  Surgeon: Waynetta Sandy, MD;  Location: [place];  Service: Vascular;  Laterality: Right;  . [place] REMOVAL Left 10/09/2015   Procedure: REMOVAL OF ARTERIOVENOUS GORETEX GRAFT ([place]) Evacuation of Lymphocele, Vein Patch angioplasty of brachial artery.;  Surgeon: Angelia Mould, MD;  Location: [place];  Service: Vascular;  Laterality: Left;  . BASCILIC VEIN TRANSPOSITION Right 02/26/2016   Procedure: Right BASCILIC VEIN TRANSPOSITION;  Surgeon: Angelia Mould, MD;  Location: [place];  Service: Vascular;  Laterality: Right;  . CIRCUMCISION N/A 01/04/2014   Procedure: CIRCUMCISION ADULT (procedure #1);  Surgeon: Marissa Nestle, MD;  Location: [facility];  Service: Urology;  Laterality: N/A;  . COLECTOMY N/A 05/04/2017   Procedure: TOTAL COLECTOMY;  Surgeon: Aviva Signs, MD;  Location: [facility];  Service: General;  Laterality: N/A;  . COLONOSCOPY N/A 04/27/2017   Procedure: COLONOSCOPY;  Surgeon: Daneil Dolin, MD;  [facility];  Service: Endoscopy;  Laterality: N/A;  245  . CYSTOSCOPY W/ RETROGRADES Bilateral 06/29/2015   Procedure:  CYSTOSCOPY, DILATION OF URETHRAL STRICTURE WITH BILATERAL RETROGRADE PYELOGRAM,SUPRAPUBIC TUBE CHANGE;  Surgeon: Festus Aloe, MD;  Location: [facility];  Service: Urology;  Laterality: Bilateral;  . CYSTOSCOPY WITH URETHRAL DILATATION N/A 12/29/2013   Procedure: CYSTOSCOPY WITH URETHRAL DILATATION;  Surgeon: Marissa Nestle, MD;  [facility];  Service: Urology;  Laterality: N/A;  . ESOPHAGOGASTRODUODENOSCOPY N/A 04/27/2017   Procedure: ESOPHAGOGASTRODUODENOSCOPY (EGD);  Surgeon: Daneil Dolin, MD;  [facility];  Service: Endoscopy;  Laterality: N/A;  . INSERTION OF DIALYSIS CATHETER Right 11/25/2018   Procedure: INSERTION OF DIALYSIS CATHETER RIGHT INTERNAL JUGULAR;  Surgeon: Waynetta Sandy, MD;  Location: [place];  Service: Vascular;  Laterality: Right;  . IR AV DIALY SHUNT INTRO [place] W/PTA/IMG RIGHT Right 09/07/2018  . IR REMOVAL TUN CV CATH W/O FL  01/12/2019  . IR THROMBECTOMY AV FISTULA W/THROMBOLYSIS/PTA INC/SHUNT/IMG RIGHT Right 04/26/2018  . IR US GUIDE VASC ACCESS RIGHT  04/26/2018  . IR US GUIDE VASC ACCESS RIGHT  09/07/2018  . ORIF FEMUR FRACTURE Right 11/22/2016   Procedure: OPEN REDUCTION INTERNAL FIXATION (ORIF) DISTAL FEMUR FRACTURE;  Surgeon: Rod Can, MD;  Location: [place];  Service: Orthopedics;  Laterality: Right;  . PATCH ANGIOPLASTY Right 12/10/2017   Procedure: PATCH ANGIOPLASTY;  Surgeon: Angelia Mould, MD;  [facility];  Service: Vascular;  Laterality: Right;  . PERIPHERAL VASCULAR BALLOON ANGIOPLASTY  08/13/2018   Procedure: PERIPHERAL VASCULAR BALLOON ANGIOPLASTY;  Surgeon: Elam Dutch, MD;  Location: [place] [facility];  Service: Cardiovascular;;  right AV fistula   . PERIPHERAL VASCULAR BALLOON ANGIOPLASTY  11/22/2018   Procedure: PERIPHERAL VASCULAR BALLOON ANGIOPLASTY;  Surgeon: Waynetta Sandy, MD;  Location: [place] [facility] LAB;  Service: Cardiovascular;;  rt AV fistula  . PERIPHERAL VASCULAR  CATHETERIZATION N/A 10/08/2015   Procedure: A/V Shuntogram;  Surgeon: Angelia Mould, MD;  Location: [place] CV LAB;  Service: Cardiovascular;  Laterality: N/A;  . THROMBECTOMY W/ EMBOLECTOMY Right 12/10/2017   Procedure: THROMBECTOMY REVISION RIGHT ARM  ARTERIOVENOUS FISTULA;  Surgeon: Angelia Mould, MD;  Location: [place];  Service: Vascular;  Laterality: Right;  . TRANSURETHRAL RESECTION OF PROSTATE N/A 01/04/2014   Procedure: TRANSURETHRAL RESECTION OF THE PROSTATE (TURP) (procedure #2);  Surgeon: Marissa Nestle, MD;  [facility];  Service: Urology;  Laterality: N/A;    Social History   Socioeconomic History  . Marital status: Single    Spouse name: Not on file  . Number of children: Not on file  . Years of education: Not on file  . Highest education level: Not on file  Occupational History  . Occupation: retired   Tobacco Use  . Smoking status: Never Smoker  . Smokeless tobacco: Never Used  Substance and Sexual Activity  . Alcohol use: No  . Drug use: No  . Sexual activity: Not Currently  Other Topics Concern  . 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   [facility] term resident of [facility]    Social Determinants of Health   Financial Resource Strain:   . Difficulty of Paying Living Expenses:   Food Insecurity:   . Worried About Charity fundraiser in the Last Year:   . Arboriculturist in the Last Year:   Transportation Needs:   . Film/video editor (Medical):   Marland Kitchen Lack of Transportation (Non-Medical):   Physical Activity: Unknown  . Days of Exercise per Week: Not on file  . Minutes of Exercise per Session: 0 min  Stress:   . Feeling of Stress :   Social Connections: Unknown  . Frequency of Communication with Friends and Family: Not on file  . Frequency of Social Gatherings with Friends and Family: Never  . Attends Religious Services: Not on file  . Active Member of Clubs  or Organizations: Not on file  . Attends Archivist Meetings: Not on file  . Marital Status: Not on file  Intimate Partner Violence:   . Fear of Current or Ex-Partner:   . Emotionally Abused:   Marland Kitchen Physically Abused:   . Sexually Abused:    Family History  Problem Relation Age of Onset  . Cancer Mother   . Colon cancer Neg Hx       VITAL SIGNS BP (!) 86/50   Pulse 86   Temp 98.1 F (36.7 C) (Oral)   Resp 18   Ht 5\' 8"  (1.727 m)   Wt 216 lb (98 kg)   SpO2 100%   BMI 32.84 kg/m   Outpatient Encounter Medications as of 03/29/2020  Medication Sig  . atorvastatin (LIPITOR) 80 MG tablet Take 80 mg by mouth every evening.  . insulin aspart (NOVOLOG FLEXPEN) 100 UNIT/ML FlexPen Inject 8 Units into the skin 3 (three) times daily with meals. Special Instructions: on dialysis days Monday,wednesday and Friday. 8 units if cbg above 150 on non dialysis days  . insulin aspart (NOVOLOG FLEXPEN) 100 UNIT/ML FlexPen insulin pen; 100 unit/mL (3 mL); amt: 8 units if above 200; subcutaneous Special Instructions: on dialysis days Monday,wednesday and Friday. With Meals on Mon, Wed, Fri 09:00 AM, 12:00 PM, 06:00 PM  . Insulin Glargine (BASAGLAR KWIKPEN) 100 UNIT/ML SOPN Inject 30 Units into the skin at bedtime.   Marland Kitchen JANUVIA 25 MG tablet Take 25 mg by mouth daily.   Marland Kitchen levothyroxine (SYNTHROID, LEVOTHROID) 88 MCG tablet Take 88 mcg by mouth daily before breakfast.  . midodrine (PROAMATINE) 10 MG tablet Take 10 mg by mouth. Send med with resident on Mon/Wed/Fri to dialysis. do not give [facility] give to resident to take with him. send with 5mg  to equal 15mg .  . midodrine (PROAMATINE) 5 MG tablet Take 5 mg by mouth. Send with resident on dialysis days.  Mon, Wed, Fri.  Take along with 10 mg to equal 15 mg  . NON FORMULARY Fluid Restriction - 1200 cc a day 500 cc for days, Evenings 500 cc, Night 200 cc  . NON FORMULARY Diet Type:  NAS, Cons CHO Diet  . Nutritional Supplements  (NEPRO/CARBSTEADY PO) Take 1 Can by mouth 2 (two) times daily.  Marland Kitchen omega-3 acid ethyl esters (LOVAZA) 1 g capsule 1 gram; amt: 2 capsules; oral  Special Instructions: elevated triglycerides At Bedtime 09:00 PM  . omeprazole (PRILOSEC) 40 MG capsule Take 40 mg by mouth at bedtime.   . polyethylene glycol (MIRALAX / GLYCOLAX) packet Take 17  g by mouth as needed for mild constipation.   . sertraline (ZOLOFT) 50 MG tablet Take 25 mg by mouth daily.   . sevelamer carbonate (RENVELA) 800 MG tablet Take 1,600 mg by mouth 3 (three) times daily.   . sevelamer carbonate (RENVELA) 800 MG tablet Take 800 mg by mouth daily. With snacks  . tamsulosin (FLOMAX) 0.4 MG CAPS capsule Take 0.4 mg by mouth every evening. Give 30 minutes after a meal  . torsemide (DEMADEX) 10 MG tablet Take 5 tablets (50 mg total) by mouth daily.   No facility-administered encounter medications on file as of 03/29/2020.     SIGNIFICANT DIAGNOSTIC EXAMS   LABS REVIEWED PREVIOUS;  05-10-19 hgb a1c 7.6   06-03-19: wbc 6.3; hgb 10.8; hct 33.4; mcv 106.7; plt 132; glucose 160; bun 30; creat 8.46; k+ 4.6; na++ 137; ca 9.1; liver normal albumin 3.6; chol 115; LDL 25.5; trig 531; hdl 24; PSA 2.73 09-07-19: hgb a1c 7.5 09-16-19: wbc 8.0; hgb 16.4; hct 50.6; mcv 107.2 plt 141; glucose 165; bun 40; creat 10.47; k+ 3.7; na++ 135; ca 9.3; liver normal albumin 4.6; stool guaiac neg  12-09-19: hgb a1c 6.9; hocl 94; ldl 4 trig 339; hdl 22 tsh 3.202  NO NEW LABS.    Review of Systems  Constitutional: Negative for malaise/fatigue.  Respiratory: Negative for cough and shortness of breath.   Cardiovascular: Negative for chest pain, palpitations and leg swelling.  Gastrointestinal: Negative for abdominal pain, constipation and heartburn.  Musculoskeletal: Negative for back pain, joint pain and myalgias.  Skin: Negative.   Neurological: Negative for dizziness.  Psychiatric/Behavioral: The patient is not nervous/anxious.     Physical Exam  Constitutional:      General: He is not in acute distress.    Appearance: He is well-developed. He is not diaphoretic.  Neck:     Thyroid: No thyromegaly.  Cardiovascular:     Rate and Rhythm: Normal rate and regular rhythm.     Heart sounds: Normal heart sounds.  Pulmonary:     Effort: Pulmonary effort is normal. No respiratory distress.     Breath sounds: Normal breath sounds.  Abdominal:     General: Bowel sounds are normal. There is no distension.     Palpations: Abdomen is soft.     Tenderness: There is no abdominal tenderness.  Genitourinary:    Comments: History of turp Musculoskeletal:     Right lower leg: No edema.     Left lower leg: No edema.     Comments: Is able to move all extremities History of right femur ORIF    Lymphadenopathy:     Cervical: No cervical adenopathy.  Skin:    General: Skin is warm and dry.     Comments: Right upper extremity a/v fistula + thrill + bruit    Neurological:     Mental Status: He is alert. Mental status is at baseline.  Psychiatric:        Mood and Affect: Mood normal.       ASSESSMENT/ PLAN:  TODAY  1. 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 Hypothyroidism due to acquired atrophy of thyroid 3. Dependence on renal dialysis 4. Intellectual disability.   Will continue current medications Will continue current plan of care Will continue to monitor his status.         MD is aware of resident's narcotic use and is in agreement with current plan of care. We will attempt to wean resident as  appropriate.  Ok Edwards NP [facility]  Contact [phone] Monday through Friday 8am- 5pm  After hours call [phone]

## 2020-04-04 ENCOUNTER — Non-Acute Institutional Stay (SKILLED_NURSING_FACILITY): Payer: Medicare Other | Admitting: Adult Health

## 2020-04-04 ENCOUNTER — Encounter: Payer: Self-pay | Admitting: Adult Health

## 2020-04-04 DIAGNOSIS — D631 Anemia in chronic kidney disease: Secondary | ICD-10-CM | POA: Diagnosis not present

## 2020-04-04 DIAGNOSIS — F321 Major depressive disorder, single episode, moderate: Secondary | ICD-10-CM | POA: Diagnosis not present

## 2020-04-04 DIAGNOSIS — E034 Atrophy of thyroid (acquired): Secondary | ICD-10-CM

## 2020-04-04 DIAGNOSIS — N186 End stage renal disease: Secondary | ICD-10-CM

## 2020-04-04 DIAGNOSIS — Z992 Dependence on renal dialysis: Secondary | ICD-10-CM

## 2020-04-04 NOTE — Progress Notes (Signed)
Location:    [place] Room Number: 112/D Place of Service:  SNF (31)   CODE STATUS: Full Code  Allergies  Allergen Reactions  . No Known Allergies     Chief Complaint  Patient presents with  . 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 There are no reports of uncontrolled pain; no reports of constipation. No reports of anxiety or depressive thoughts.   Past Medical History:  Diagnosis Date  . Abnormal CT scan, kidney 10/06/2011  . Acute pyelonephritis 10/07/2011  . Anemia    normocytic  . Anxiety    mental retardation  . Bladder wall thickening 10/06/2011  . BPH (benign prostatic hypertrophy)   . Diabetes mellitus   . Dialysis patient [facility])    Tuesday, Thursday and Saturday,   . DVT of leg (deep venous thrombosis) ([place]) 12/25/2016  . Edema     history of lower extremity edema  . GERD (gastroesophageal reflux disease)   . Heme positive stool   . Hydronephrosis   . Hyperkalemia   . Hyperlipidemia   . Hypernatremia   . Hypertension   . Hypothyroidism   . Impaired speech   . 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 MR (mental retardation)   . Muscle weakness   . Obstructive uropathy   . 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 Poor historian poor historian  . Protein calorie malnutrition ([place])   . Pyelonephritis   . Renal failure (ARF), acute on chronic (HCC)   . Renal insufficiency    chronic history  . Sepsis ([place])   . Smoking   . Uremia   . Urinary retention   . UTI (lower urinary tract infection) 10/06/2011    Past Surgical History:  Procedure Laterality Date  . A/V FISTULAGRAM N/A 08/13/2018   Procedure: A/V FISTULAGRAM - Right Upper;  Surgeon:  Elam Dutch, MD;  Location: [place] CV LAB;  Service: Cardiovascular;  Laterality: N/A;  . A/V FISTULAGRAM N/A 11/22/2018   Procedure: A/V FISTULAGRAM - Right Upper;  Surgeon: Waynetta Sandy, MD;  Location: [place] CV LAB;  Service: Cardiovascular;  Laterality: N/A;  . AV FISTULA PLACEMENT Left 07/06/2015   Procedure:  INSERTION LEFT ARM ARTERIOVENOUS GORTEX GRAFT;  Surgeon: Angelia Mould, MD;  Location: [place];  Service: Vascular;  Laterality: Left;  . AV FISTULA PLACEMENT Right 02/26/2016   Procedure: ARTERIOVENOUS (AV) FISTULA CREATION ;  Surgeon: Angelia Mould, MD;  Location: [place];  Service: Vascular;  Laterality: Right;  . AV FISTULA PLACEMENT Right 11/25/2018   Procedure: INSERTION OF ARTERIOVENOUS (AV) ARTEGRAFT RIGHT UPPER ARM;  Surgeon: Waynetta Sandy, MD;  Location: [place];  Service: Vascular;  Laterality: Right;  . [place] REMOVAL Left 10/09/2015   Procedure: REMOVAL OF ARTERIOVENOUS GORETEX GRAFT ([place]) Evacuation of Lymphocele, Vein Patch angioplasty of brachial artery.;  Surgeon: Angelia Mould, MD;  Location: [place];  Service: Vascular;  Laterality: Left;  . BASCILIC VEIN TRANSPOSITION Right 02/26/2016   Procedure: Right BASCILIC VEIN TRANSPOSITION;  Surgeon: Angelia Mould, MD;  Location: [place];  Service: Vascular;  Laterality: Right;  . CIRCUMCISION N/A 01/04/2014   Procedure: CIRCUMCISION ADULT (  procedure #1);  Surgeon: Marissa Nestle, MD;  [facility];  Service: Urology;  Laterality: N/A;  . COLECTOMY N/A 05/04/2017   Procedure: TOTAL COLECTOMY;  Surgeon: Aviva Signs, MD;  [facility];  Service: General;  Laterality: N/A;  . COLONOSCOPY N/A 04/27/2017   Procedure: COLONOSCOPY;  Surgeon: Daneil Dolin, MD;  [facility];  Service: Endoscopy;  Laterality: N/A;  245  . CYSTOSCOPY W/ RETROGRADES Bilateral 06/29/2015   Procedure: CYSTOSCOPY, DILATION OF URETHRAL STRICTURE WITH BILATERAL RETROGRADE  PYELOGRAM,SUPRAPUBIC TUBE CHANGE;  Surgeon: Festus Aloe, MD;  [facility];  Service: Urology;  Laterality: Bilateral;  . CYSTOSCOPY WITH URETHRAL DILATATION N/A 12/29/2013   Procedure: CYSTOSCOPY WITH URETHRAL DILATATION;  Surgeon: Marissa Nestle, MD;  [facility];  Service: Urology;  Laterality: N/A;  . ESOPHAGOGASTRODUODENOSCOPY N/A 04/27/2017   Procedure: ESOPHAGOGASTRODUODENOSCOPY (EGD);  Surgeon: Daneil Dolin, MD;  [facility];  Service: Endoscopy;  Laterality: N/A;  . INSERTION OF DIALYSIS CATHETER Right 11/25/2018   Procedure: INSERTION OF DIALYSIS CATHETER RIGHT INTERNAL JUGULAR;  Surgeon: Waynetta Sandy, MD;  Location: [place];  Service: Vascular;  Laterality: Right;  . IR AV DIALY SHUNT INTRO [place] W/PTA/IMG RIGHT Right 09/07/2018  . IR REMOVAL TUN CV CATH W/O FL  01/12/2019  . IR THROMBECTOMY AV FISTULA W/THROMBOLYSIS/PTA INC/SHUNT/IMG RIGHT Right 04/26/2018  . IR US GUIDE VASC ACCESS RIGHT  04/26/2018  . IR US GUIDE VASC ACCESS RIGHT  09/07/2018  . ORIF FEMUR FRACTURE Right 11/22/2016   Procedure: OPEN REDUCTION INTERNAL FIXATION (ORIF) DISTAL FEMUR FRACTURE;  Surgeon: Rod Can, MD;  Location: [place];  Service: Orthopedics;  Laterality: Right;  . PATCH ANGIOPLASTY Right 12/10/2017   Procedure: PATCH ANGIOPLASTY;  Surgeon: Angelia Mould, MD;  [facility];  Service: Vascular;  Laterality: Right;  . PERIPHERAL VASCULAR BALLOON ANGIOPLASTY  08/13/2018   Procedure: PERIPHERAL VASCULAR BALLOON ANGIOPLASTY;  Surgeon: Elam Dutch, MD;  Location: [place] CV LAB;  Service: Cardiovascular;;  right AV fistula   . PERIPHERAL VASCULAR BALLOON ANGIOPLASTY  11/22/2018   Procedure: PERIPHERAL VASCULAR BALLOON ANGIOPLASTY;  Surgeon: Waynetta Sandy, MD;  Location: [place] CV LAB;  Service: Cardiovascular;;  rt AV fistula  . PERIPHERAL VASCULAR CATHETERIZATION N/A 10/08/2015   Procedure: A/V Shuntogram;  Surgeon:  Angelia Mould, MD;  Location: [place] CV LAB;  Service: Cardiovascular;  Laterality: N/A;  . THROMBECTOMY W/ EMBOLECTOMY Right 12/10/2017   Procedure: THROMBECTOMY REVISION RIGHT ARM  ARTERIOVENOUS FISTULA;  Surgeon: Angelia Mould, MD;  Location: [place];  Service: Vascular;  Laterality: Right;  . TRANSURETHRAL RESECTION OF PROSTATE N/A 01/04/2014   Procedure: TRANSURETHRAL RESECTION OF THE PROSTATE (TURP) (procedure #2);  Surgeon: Marissa Nestle, MD;  [facility];  Service: Urology;  Laterality: N/A;    Social History   Socioeconomic History  . Marital status: Single    Spouse name: Not on file  . Number of children: Not on file  . Years of education: Not on file  . Highest education level: Not on file  Occupational History  . Occupation: retired   Tobacco Use  . Smoking status: Never Smoker  . Smokeless tobacco: Never Used  Substance and Sexual Activity  . Alcohol use: No  . Drug use: No  . Sexual activity: Not Currently  Other Topics Concern  . 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   Long term  resident of [facility]    Social Determinants of Health   Financial Resource Strain:   . Difficulty of Paying Living Expenses:   Food Insecurity:   . Worried About Charity fundraiser in the Last Year:   . Arboriculturist in the Last Year:   Transportation Needs:   . Film/video editor (Medical):   Marland Kitchen Lack of Transportation (Non-Medical):   Physical Activity: Unknown  . Days of Exercise per Week: Not on file  . Minutes of Exercise per Session: 0 min  Stress:   . Feeling of Stress :   Social Connections: Unknown  . Frequency of Communication with Friends and Family: Not on file  . Frequency of Social Gatherings with Friends and Family: Never  . Attends Religious Services: Not on file  . Active Member of Clubs or Organizations: Not on file  . Attends Archivist  Meetings: Not on file  . Marital Status: Not on file  Intimate Partner Violence:   . Fear of Current or Ex-Partner:   . Emotionally Abused:   Marland Kitchen Physically Abused:   . Sexually Abused:    Family History  Problem Relation Age of Onset  . Cancer Mother   . Colon cancer Neg Hx       VITAL SIGNS BP 128/70   Pulse 80   Temp 97.8 F (36.6 C) (Oral)   Resp 17   Ht 5\' 8"  (1.727 m)   Wt 216 lb (98 kg)   SpO2 98%   BMI 32.84 kg/m   Outpatient Encounter Medications as of 04/04/2020  Medication Sig  . atorvastatin (LIPITOR) 80 MG tablet Take 80 mg by mouth every evening.  . insulin aspart (NOVOLOG FLEXPEN) 100 UNIT/ML FlexPen Inject 8 Units into the skin 3 (three) times daily with meals. Special Instructions: on dialysis days Monday,wednesday and Friday. 8 units if cbg above 150 on non dialysis days  . insulin aspart (NOVOLOG FLEXPEN) 100 UNIT/ML FlexPen insulin pen; 100 unit/mL (3 mL); amt: 8 units if above 200; subcutaneous Special Instructions: on dialysis days Monday,wednesday and Friday. With Meals on Mon, Wed, Fri 09:00 AM, 12:00 PM, 06:00 PM  . Insulin Glargine (BASAGLAR KWIKPEN) 100 UNIT/ML SOPN Inject 30 Units into the skin at bedtime.   Marland Kitchen JANUVIA 25 MG tablet Take 25 mg by mouth daily.   Marland Kitchen levothyroxine (SYNTHROID, LEVOTHROID) 88 MCG tablet Take 88 mcg by mouth daily before breakfast.  . midodrine (PROAMATINE) 10 MG tablet Take 10 mg by mouth. Send med with resident on Mon/Wed/Fri to dialysis. do not give at [facility] give to resident to take with him. send with 5mg  to equal 15mg .  . midodrine (PROAMATINE) 5 MG tablet Take 5 mg by mouth. Send with resident on dialysis days.  Mon, Wed, Fri.  Take along with 10 mg to equal 15 mg  . NON FORMULARY Fluid Restriction - 1200 cc a day 500 cc for days, Evenings 500 cc, Night 200 cc  . NON FORMULARY Diet Type:  NAS, Cons CHO Diet  . Nutritional Supplements (NEPRO/CARBSTEADY PO) Take 1 Can by mouth 2 (two) times daily.  Marland Kitchen omeprazole  (PRILOSEC) 40 MG capsule Take 40 mg by mouth at bedtime.   . polyethylene glycol (MIRALAX / GLYCOLAX) packet Take 17 g by mouth as needed for mild constipation.   . sertraline (ZOLOFT) 50 MG tablet Take 25 mg by mouth daily.   . sevelamer carbonate (RENVELA) 800 MG tablet Take 1,600 mg by mouth 3 (three)  times daily.   . sevelamer carbonate (RENVELA) 800 MG tablet Take 800 mg by mouth daily. With snacks  . tamsulosin (FLOMAX) 0.4 MG CAPS capsule Take 0.4 mg by mouth every evening. Give 30 minutes after a meal  . torsemide (DEMADEX) 10 MG tablet Take 5 tablets (50 mg total) by mouth daily.  . [DISCONTINUED] omega-3 acid ethyl esters (LOVAZA) 1 g capsule 1 gram; amt: 2 capsules; oral  Special Instructions: elevated triglycerides At Bedtime 09:00 PM   No facility-administered encounter medications on file as of 04/04/2020.     SIGNIFICANT DIAGNOSTIC EXAMS   LABS REVIEWED PREVIOUS;  05-10-19 hgb a1c 7.6   06-03-19: wbc 6.3; hgb 10.8; hct 33.4; mcv 106.7; plt 132; glucose 160; bun 30; creat 8.46; k+ 4.6; na++ 137; ca 9.1; liver normal albumin 3.6; chol 115; LDL 25.5; trig 531; hdl 24; PSA 2.73 09-07-19: hgb a1c 7.5 09-16-19: wbc 8.0; hgb 16.4; hct 50.6; mcv 107.2 plt 141; glucose 165; bun 40; creat 10.47; k+ 3.7; na++ 135; ca 9.3; liver normal albumin 4.6; stool guaiac neg  12-09-19: hgb a1c 6.9; hocl 94; ldl 4 trig 339; hdl 22 tsh 3.202  NO NEW LABS.    Review of Systems  Constitutional: Negative for malaise/fatigue.  Respiratory: Negative for cough and shortness of breath.   Cardiovascular: Negative for chest pain, palpitations and leg swelling.  Gastrointestinal: Negative for abdominal pain, constipation and heartburn.  Musculoskeletal: Negative for back pain, joint pain and myalgias.  Skin: Negative.   Neurological: Negative for dizziness.  Psychiatric/Behavioral: The patient is not nervous/anxious.     Physical Exam Constitutional:      General: He is not in acute distress.     Appearance: He is well-developed. He is not diaphoretic.  Neck:     Thyroid: No thyromegaly.  Cardiovascular:     Rate and Rhythm: Normal rate and regular rhythm.     Pulses: Normal pulses.     Heart sounds: Normal heart sounds.  Pulmonary:     Effort: Pulmonary effort is normal. No respiratory distress.     Breath sounds: Normal breath sounds.  Abdominal:     General: Bowel sounds are normal. There is no distension.     Palpations: Abdomen is soft.     Tenderness: There is no abdominal tenderness.  Genitourinary:    Comments: History of turp Musculoskeletal:     Cervical back: Neck supple.     Right lower leg: No edema.     Left lower leg: No edema.     Comments:  Is able to move all extremities History of right femur ORIF     Lymphadenopathy:     Cervical: No cervical adenopathy.  Skin:    General: Skin is warm and dry.     Comments: Right upper extremity a/v fistula + thrill + bruit     Neurological:     Mental Status: He is alert. Mental status is at baseline.  Psychiatric:        Mood and Affect: Mood normal.      ASSESSMENT/ PLAN:  TODAY  1. Other specified hypothyroidism: is stable tsh 3.202 with continue synthroid 88 mcg daily   2. 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 Major depression single episode moderate: is stable will continue zoloft 25 mg daily    PREVIOUS   4. Bilateral lower extremity edema: is stable will continue demadex 50 mg daily   5. Orthostatic hypotension: is stable  94/56 will continue midodrine 15 mg with dialysis  6. GERD without esophagitis: is stable will continue prilosec 40 mg daily   7. 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 Dyslipidemia associated with type 2 diabetes mellitus: is stable trig 339; will continue lipitor 80 mg daily and  lovaza 2 gm daily   9. End stage renal disease on hemodialysis due to type 2 diabetes mellitus is stable will continue dialysis 3 days weekly followed by nephrology will continue midodrine 15 mg with dialysis 1200 cc fluid restriction renvela 1600 mg with meals and 800 mg with snacks.       MD is aware of resident's narcotic use and is in agreement with current plan of care. We will attempt to wean resident as appropriate.  Ok Edwards NP [facility]  Contact [phone] Monday through Friday 8am- 5pm  After hours call [phone]

## 2020-04-09 ENCOUNTER — Encounter (HOSPITAL_COMMUNITY)
Admission: RE | Admit: 2020-04-09 | Discharge: 2020-04-09 | Disposition: A | Discharge status: Home / Self Care | Payer: Medicare Other | Source: Skilled Nursing Facility | Attending: Adult Health | Admitting: Adult Health

## 2020-04-09 DIAGNOSIS — E1129 Type 2 diabetes mellitus with other diabetic kidney complication: Secondary | ICD-10-CM | POA: Insufficient documentation

## 2020-04-09 LAB — COMPREHENSIVE METABOLIC PANEL
ALT: 27 U/L (ref 0–44)
AST: 18 U/L (ref 15–41)
Albumin: 3.7 g/dL (ref 3.5–5.0)
Alkaline Phosphatase: 87 U/L (ref 38–126)
Anion gap: 12 (ref 5–15)
BUN: 43 mg/dL — ABNORMAL HIGH (ref 8–23)
CO2: 21 mmol/L — ABNORMAL LOW (ref 22–32)
Calcium: 8.7 mg/dL — ABNORMAL LOW (ref 8.9–10.3)
Chloride: 104 mmol/L (ref 98–111)
Creatinine, Ser: 10.75 mg/dL — ABNORMAL HIGH (ref 0.61–1.24)
GFR calc Af Amer: 5 mL/min — ABNORMAL LOW (ref >60)
GFR calc non Af Amer: 4 mL/min — ABNORMAL LOW (ref >60)
Glucose, Bld: 185 mg/dL — ABNORMAL HIGH (ref 70–99)
Potassium: 4.3 mmol/L (ref 3.5–5.1)
Sodium: 137 mmol/L (ref 135–145)
Total Bilirubin: 0.5 mg/dL (ref 0.3–1.2)
Total Protein: 7.3 g/dL (ref 6.5–8.1)

## 2020-04-09 LAB — CBC
HCT: 33.5 % — ABNORMAL LOW (ref 39.0–52.0)
Hemoglobin: 10.7 g/dL — ABNORMAL LOW (ref 13.0–17.0)
MCH: 34.9 pg — ABNORMAL HIGH (ref 26.0–34.0)
MCHC: 31.9 g/dL (ref 30.0–36.0)
MCV: 109.1 fL — ABNORMAL HIGH (ref 80.0–100.0)
Platelets: 144 10*3/uL — ABNORMAL LOW (ref 150–400)
RBC: 3.07 MIL/uL — ABNORMAL LOW (ref 4.22–5.81)
RDW: 13.3 % (ref 11.5–15.5)
WBC: 6.2 10*3/uL (ref 4.0–10.5)
nRBC: 0 % (ref 0.0–0.2)

## 2020-04-09 LAB — HEMOGLOBIN A1C
Hgb A1c MFr Bld: 7.5 % — ABNORMAL HIGH (ref 4.8–5.6)
Mean Plasma Glucose: 168.55 mg/dL

## 2020-04-11 ENCOUNTER — Non-Acute Institutional Stay: Payer: Self-pay | Admitting: Adult Health

## 2020-04-11 ENCOUNTER — Encounter: Payer: Self-pay | Admitting: Adult Health

## 2020-04-11 DIAGNOSIS — F321 Major depressive disorder, single episode, moderate: Secondary | ICD-10-CM

## 2020-04-11 NOTE — Progress Notes (Signed)
Location:    [place] Room Number: 112/D Place of Service:  SNF (31)   CODE STATUS: Full Code  Allergies  Allergen Reactions  . No Known Allergies     Chief Complaint  Patient presents with  . Acute Visit    Medication Review    HPI:  He is presently taking zoloft 25 mg daily for depression. There are no reports of anxiety no depressive thoughts. His appetite is stable. He is sleeping at night. The care plan team feels as though he is due to have a dose reduction.   Past Medical History:  Diagnosis Date  . Abnormal CT scan, kidney 10/06/2011  . Acute pyelonephritis 10/07/2011  . Anemia    normocytic  . Anxiety    mental retardation  . Bladder wall thickening 10/06/2011  . BPH (benign prostatic hypertrophy)   . Diabetes mellitus   . Dialysis patient [facility])    Tuesday, Thursday and Saturday,   . DVT of leg (deep venous thrombosis) ([place]) 12/25/2016  . Edema     history of lower extremity edema  . GERD (gastroesophageal reflux disease)   . Heme positive stool   . Hydronephrosis   . Hyperkalemia   . Hyperlipidemia   . Hypernatremia   . Hypertension   . Hypothyroidism   . Impaired speech   . 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 MR (mental retardation)   . Muscle weakness   . Obstructive uropathy   . 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 Poor historian poor historian  . Protein calorie malnutrition ([place])   . Pyelonephritis   . Renal failure (ARF), acute on chronic (HCC)   . Renal insufficiency    chronic history  . Sepsis ([place])   . Smoking   . Uremia   . Urinary retention   . UTI (lower urinary tract infection) 10/06/2011    Past Surgical History:  Procedure Laterality Date  . A/V FISTULAGRAM N/A 08/13/2018   Procedure: A/V FISTULAGRAM - Right Upper;  Surgeon: Elam Dutch, MD;  Location: [place] CV LAB;  Service: Cardiovascular;  Laterality: N/A;  . A/V FISTULAGRAM N/A 11/22/2018   Procedure: A/V FISTULAGRAM - Right  Upper;  Surgeon: Waynetta Sandy, MD;  Location: [place] CV LAB;  Service: Cardiovascular;  Laterality: N/A;  . AV FISTULA PLACEMENT Left 07/06/2015   Procedure:  INSERTION LEFT ARM ARTERIOVENOUS GORTEX GRAFT;  Surgeon: Angelia Mould, MD;  Location: [place];  Service: Vascular;  Laterality: Left;  . AV FISTULA PLACEMENT Right 02/26/2016   Procedure: ARTERIOVENOUS (AV) FISTULA CREATION ;  Surgeon: Angelia Mould, MD;  Location: [place];  Service: Vascular;  Laterality: Right;  . AV FISTULA PLACEMENT Right 11/25/2018   Procedure: INSERTION OF ARTERIOVENOUS (AV) ARTEGRAFT RIGHT UPPER ARM;  Surgeon: Waynetta Sandy, MD;  Location: [place];  Service: Vascular;  Laterality: Right;  . [place] REMOVAL Left 10/09/2015   Procedure: REMOVAL OF ARTERIOVENOUS GORETEX GRAFT ([place]) Evacuation of Lymphocele, Vein Patch angioplasty of brachial artery.;  Surgeon: Angelia Mould, MD;  Location: [place];  Service: Vascular;  Laterality: Left;  . BASCILIC VEIN TRANSPOSITION Right 02/26/2016   Procedure: Right BASCILIC VEIN TRANSPOSITION;  Surgeon: Angelia Mould, MD;  Location: [place];  Service: Vascular;  Laterality: Right;  . CIRCUMCISION N/A 01/04/2014   Procedure: CIRCUMCISION ADULT (procedure #1);  Surgeon: Marissa Nestle, MD;  Location: [facility];  Service: Urology;  Laterality: N/A;  . COLECTOMY N/A 05/04/2017  Procedure: TOTAL COLECTOMY;  Surgeon: Aviva Signs, MD;  Location: [facility];  Service: General;  Laterality: N/A;  . COLONOSCOPY N/A 04/27/2017   Procedure: COLONOSCOPY;  Surgeon: Daneil Dolin, MD;  [facility];  Service: Endoscopy;  Laterality: N/A;  245  . CYSTOSCOPY W/ RETROGRADES Bilateral 06/29/2015   Procedure: CYSTOSCOPY, DILATION OF URETHRAL STRICTURE WITH BILATERAL RETROGRADE PYELOGRAM,SUPRAPUBIC TUBE CHANGE;  Surgeon: Festus Aloe, MD;  [facility]: [facility];  Service: Urology;  Laterality: Bilateral;  . CYSTOSCOPY WITH URETHRAL DILATATION N/A 12/29/2013     Procedure: CYSTOSCOPY WITH URETHRAL DILATATION;  Surgeon: Marissa Nestle, MD;  [facility];  Service: Urology;  Laterality: N/A;  . ESOPHAGOGASTRODUODENOSCOPY N/A 04/27/2017   Procedure: ESOPHAGOGASTRODUODENOSCOPY (EGD);  Surgeon: Daneil Dolin, MD;  [facility];  Service: Endoscopy;  Laterality: N/A;  . INSERTION OF DIALYSIS CATHETER Right 11/25/2018   Procedure: INSERTION OF DIALYSIS CATHETER RIGHT INTERNAL JUGULAR;  Surgeon: Waynetta Sandy, MD;  Location: [place];  Service: Vascular;  Laterality: Right;  . IR AV DIALY SHUNT INTRO [place] W/PTA/IMG RIGHT Right 09/07/2018  . IR REMOVAL TUN CV CATH W/O FL  01/12/2019  . IR THROMBECTOMY AV FISTULA W/THROMBOLYSIS/PTA INC/SHUNT/IMG RIGHT Right 04/26/2018  . IR US GUIDE VASC ACCESS RIGHT  04/26/2018  . IR US GUIDE VASC ACCESS RIGHT  09/07/2018  . ORIF FEMUR FRACTURE Right 11/22/2016   Procedure: OPEN REDUCTION INTERNAL FIXATION (ORIF) DISTAL FEMUR FRACTURE;  Surgeon: Rod Can, MD;  Location: [place];  Service: Orthopedics;  Laterality: Right;  . PATCH ANGIOPLASTY Right 12/10/2017   Procedure: PATCH ANGIOPLASTY;  Surgeon: Angelia Mould, MD;  [facility];  Service: Vascular;  Laterality: Right;  . PERIPHERAL VASCULAR BALLOON ANGIOPLASTY  08/13/2018   Procedure: PERIPHERAL VASCULAR BALLOON ANGIOPLASTY;  Surgeon: Elam Dutch, MD;  Location: [place] [facility] LAB;  Service: Cardiovascular;;  right AV fistula   . PERIPHERAL VASCULAR BALLOON ANGIOPLASTY  11/22/2018   Procedure: PERIPHERAL VASCULAR BALLOON ANGIOPLASTY;  Surgeon: Waynetta Sandy, MD;  Location: [place] CV LAB;  Service: Cardiovascular;;  rt AV fistula  . PERIPHERAL VASCULAR CATHETERIZATION N/A 10/08/2015   Procedure: A/V Shuntogram;  Surgeon: Angelia Mould, MD;  Location: [place] CV LAB;  Service: Cardiovascular;  Laterality: N/A;  . THROMBECTOMY W/ EMBOLECTOMY Right 12/10/2017   Procedure: THROMBECTOMY  REVISION RIGHT ARM  ARTERIOVENOUS FISTULA;  Surgeon: Angelia Mould, MD;  Location: [place];  Service: Vascular;  Laterality: Right;  . TRANSURETHRAL RESECTION OF PROSTATE N/A 01/04/2014   Procedure: TRANSURETHRAL RESECTION OF THE PROSTATE (TURP) (procedure #2);  Surgeon: Marissa Nestle, MD;  [facility];  Service: Urology;  Laterality: N/A;    Social History   Socioeconomic History  . Marital status: Single    Spouse name: Not on file  . Number of children: Not on file  . Years of education: Not on file  . Highest education level: Not on file  Occupational History  . Occupation: retired   Tobacco Use  . Smoking status: Never Smoker  . Smokeless tobacco: Never Used  Vaping Use  . Vaping Use: Never used  Substance and Sexual Activity  . Alcohol use: No  . Drug use: No  . Sexual activity: Not Currently  Other Topics Concern  . 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   [facility] resident of [facility]    Social Determinants of Health   Financial Resource Strain:   .  Difficulty of Paying Living Expenses:   Food Insecurity:   . Worried About Charity fundraiser in the Last Year:   . Arboriculturist in the Last Year:   Transportation Needs:   . Film/video editor (Medical):   Marland Kitchen Lack of Transportation (Non-Medical):   Physical Activity: Unknown  . Days of Exercise per Week: Not on file  . Minutes of Exercise per Session: 0 min  Stress:   . Feeling of Stress :   Social Connections: Unknown  . Frequency of Communication with Friends and Family: Not on file  . Frequency of Social Gatherings with Friends and Family: Never  . Attends Religious Services: Not on file  . Active Member of Clubs or Organizations: Not on file  . Attends Archivist Meetings: Not on file  . Marital Status: Not on file  Intimate Partner Violence:   . Fear of Current or Ex-Partner:   . Emotionally Abused:     Marland Kitchen Physically Abused:   . Sexually Abused:    Family History  Problem Relation Age of Onset  . Cancer Mother   . Colon cancer Neg Hx       VITAL SIGNS BP 107/64   Pulse 91   Temp 98.2 F (36.8 C) (Oral)   Resp 20   Ht 5\' 8"  (1.727 m)   Wt 216 lb (98 kg)   SpO2 98%   BMI 32.84 kg/m   Outpatient Encounter Medications as of 04/11/2020  Medication Sig  . atorvastatin (LIPITOR) 80 MG tablet Take 80 mg by mouth every evening.  . insulin aspart (NOVOLOG FLEXPEN) 100 UNIT/ML FlexPen Inject 8 Units into the skin 3 (three) times daily with meals. Special Instructions: on dialysis days Monday,wednesday and Friday. 8 units if cbg above 150 on non dialysis days  . insulin aspart (NOVOLOG FLEXPEN) 100 UNIT/ML FlexPen insulin pen; 100 unit/mL (3 mL); amt: 8 units if above 200; subcutaneous Special Instructions: on dialysis days Monday,wednesday and Friday. With Meals on Mon, Wed, Fri 09:00 AM, 12:00 PM, 06:00 PM  . Insulin Glargine (BASAGLAR KWIKPEN) 100 UNIT/ML SOPN Inject 30 Units into the skin at bedtime.   Marland Kitchen JANUVIA 25 MG tablet Take 25 mg by mouth daily.   Marland Kitchen levothyroxine (SYNTHROID, LEVOTHROID) 88 MCG tablet Take 88 mcg by mouth daily before breakfast.  . midodrine (PROAMATINE) 10 MG tablet Take 10 mg by mouth. Send med with resident on Mon/Wed/Fri to dialysis. do not give [facility] give to resident to take with him. send with 5mg  to equal 15mg .  . midodrine (PROAMATINE) 5 MG tablet Take 5 mg by mouth. Send with resident on dialysis days.  Mon, Wed, Fri.  Take along with 10 mg to equal 15 mg  . NON FORMULARY Fluid Restriction - 1200 cc a day 500 cc for days, Evenings 500 cc, Night 200 cc  . NON FORMULARY Diet Type:  NAS, Cons CHO Diet  . Nutritional Supplements (NEPRO/CARBSTEADY PO) Take 1 Can by mouth 2 (two) times daily.  Marland Kitchen omeprazole (PRILOSEC) 40 MG capsule Take 40 mg by mouth at bedtime.   . polyethylene glycol (MIRALAX / GLYCOLAX) packet Take 17 g by mouth as needed for  mild constipation.   . sertraline (ZOLOFT) 50 MG tablet Take 25 mg by mouth daily.   . sevelamer carbonate (RENVELA) 800 MG tablet Take 1,600 mg by mouth 3 (three) times daily.   . sevelamer carbonate (RENVELA) 800 MG tablet Take 800 mg by mouth daily.  With snacks  . tamsulosin (FLOMAX) 0.4 MG CAPS capsule Take 0.4 mg by mouth every evening. Give 30 minutes after a meal  . torsemide (DEMADEX) 10 MG tablet Take 5 tablets (50 mg total) by mouth daily.   No facility-administered encounter medications on file as of 04/11/2020.     SIGNIFICANT DIAGNOSTIC EXAMS   LABS REVIEWED PREVIOUS;  05-10-19 hgb a1c 7.6   06-03-19: wbc 6.3; hgb 10.8; hct 33.4; mcv 106.7; plt 132; glucose 160; bun 30; creat 8.46; k+ 4.6; na++ 137; ca 9.1; liver normal albumin 3.6; chol 115; LDL 25.5; trig 531; hdl 24; PSA 2.73 09-07-19: hgb a1c 7.5 09-16-19: wbc 8.0; hgb 16.4; hct 50.6; mcv 107.2 plt 141; glucose 165; bun 40; creat 10.47; k+ 3.7; na++ 135; ca 9.3; liver normal albumin 4.6; stool guaiac neg  12-09-19: hgb a1c 6.9; chol 94; ldl 4 trig 339; hdl 22 tsh 3.202   TODAY  04-09-20: wbc 6.2; hgb 10.2; hct 33.5; mcv 109.1 plt 144; glucose 185; bun 43; creat 10.75 k+ 4.3; na++ 137; ca 8.7; liver normal albumin 3.7 hgb a1c 7.5   Review of Systems  Constitutional: Negative for malaise/fatigue.  Respiratory: Negative for cough and shortness of breath.   Cardiovascular: Negative for chest pain, palpitations and leg swelling.  Gastrointestinal: Negative for abdominal pain, constipation and heartburn.  Musculoskeletal: Negative for back pain, joint pain and myalgias.  Skin: Negative.   Neurological: Negative for dizziness.  Psychiatric/Behavioral: The patient is not nervous/anxious.     Physical Exam Constitutional:      General: He is not in acute distress.    Appearance: He is well-developed. He is not diaphoretic.  Neck:     Thyroid: No thyromegaly.  Cardiovascular:     Rate and Rhythm: Normal rate and regular  rhythm.     Pulses: Normal pulses.     Heart sounds: Normal heart sounds.  Pulmonary:     Effort: Pulmonary effort is normal. No respiratory distress.     Breath sounds: Normal breath sounds.  Abdominal:     General: Bowel sounds are normal. There is no distension.     Palpations: Abdomen is soft.     Tenderness: There is no abdominal tenderness.  Genitourinary:    Comments: History of turp Musculoskeletal:     Cervical back: Neck supple.     Right lower leg: No edema.     Left lower leg: No edema.     Comments: Is able to move all extremities History of right femur ORIF      Lymphadenopathy:     Cervical: No cervical adenopathy.  Skin:    General: Skin is warm and dry.     Comments: Right upper extremity a/v fistula + thrill + bruit   Neurological:     Mental Status: He is alert. Mental status is at baseline.  Psychiatric:        Mood and Affect: Mood normal.        ASSESSMENT/ PLAN:  TODAY  1. Depression major single episode moderate: will change to zoloft 25 mg every other day through 04-18-20 then stop will monitor his status.     MD is aware of resident's narcotic use and is in agreement with current plan of care. We will attempt to wean resident as appropriate.  Ok Edwards NP [facility]  Contact [phone] Monday through Friday 8am- 5pm  After hours call [phone]

## 2020-04-27 DIAGNOSIS — D631 Anemia in chronic kidney disease: Secondary | ICD-10-CM | POA: Diagnosis not present

## 2020-04-27 DIAGNOSIS — D509 Iron deficiency anemia, unspecified: Secondary | ICD-10-CM | POA: Diagnosis not present

## 2020-04-27 DIAGNOSIS — Z992 Dependence on renal dialysis: Secondary | ICD-10-CM | POA: Diagnosis not present

## 2020-04-27 DIAGNOSIS — N2581 Secondary hyperparathyroidism of renal origin: Secondary | ICD-10-CM | POA: Diagnosis not present

## 2020-04-27 DIAGNOSIS — N186 End stage renal disease: Secondary | ICD-10-CM | POA: Diagnosis not present

## 2020-04-30 DIAGNOSIS — D509 Iron deficiency anemia, unspecified: Secondary | ICD-10-CM | POA: Diagnosis not present

## 2020-04-30 DIAGNOSIS — N2581 Secondary hyperparathyroidism of renal origin: Secondary | ICD-10-CM | POA: Diagnosis not present

## 2020-04-30 DIAGNOSIS — Z992 Dependence on renal dialysis: Secondary | ICD-10-CM | POA: Diagnosis not present

## 2020-04-30 DIAGNOSIS — D631 Anemia in chronic kidney disease: Secondary | ICD-10-CM | POA: Diagnosis not present

## 2020-04-30 DIAGNOSIS — N186 End stage renal disease: Secondary | ICD-10-CM | POA: Diagnosis not present

## 2020-05-02 DIAGNOSIS — D631 Anemia in chronic kidney disease: Secondary | ICD-10-CM | POA: Diagnosis not present

## 2020-05-02 DIAGNOSIS — Z992 Dependence on renal dialysis: Secondary | ICD-10-CM | POA: Diagnosis not present

## 2020-05-02 DIAGNOSIS — D509 Iron deficiency anemia, unspecified: Secondary | ICD-10-CM | POA: Diagnosis not present

## 2020-05-02 DIAGNOSIS — N2581 Secondary hyperparathyroidism of renal origin: Secondary | ICD-10-CM | POA: Diagnosis not present

## 2020-05-02 DIAGNOSIS — N186 End stage renal disease: Secondary | ICD-10-CM | POA: Diagnosis not present

## 2020-05-03 ENCOUNTER — Non-Acute Institutional Stay (SKILLED_NURSING_FACILITY): Payer: Medicare Other | Admitting: Adult Health

## 2020-05-03 ENCOUNTER — Encounter: Payer: Self-pay | Admitting: Adult Health

## 2020-05-03 DIAGNOSIS — R600 Localized edema: Secondary | ICD-10-CM | POA: Diagnosis not present

## 2020-05-03 DIAGNOSIS — K219 Gastro-esophageal reflux disease without esophagitis: Secondary | ICD-10-CM

## 2020-05-03 DIAGNOSIS — I951 Orthostatic hypotension: Secondary | ICD-10-CM | POA: Diagnosis not present

## 2020-05-03 NOTE — Progress Notes (Signed)
Location:    [facility] Room Number: 112/D Place of Service:  SNF (31)   CODE STATUS: Full Code  Allergies  Allergen Reactions  . No Known Allergies     Chief Complaint  Patient presents with  . Medical Management of Chronic Issues         Bilateral lower extremity edema:   Orthostatic hypotension;  GERD without esophagitis    HPI:  He is a 74 year old long term resident of this facility being seen for the management of his chronic illnesses: edema; hypotension; gerd. There are no reports of uncontrolled pain; no reports of lower extremity edema; no reports of constipation or diarrhea.   Past Medical History:  Diagnosis Date  . Abnormal CT scan, kidney 10/06/2011  . Acute pyelonephritis 10/07/2011  . Anemia    normocytic  . Anxiety    mental retardation  . Bladder wall thickening 10/06/2011  . BPH (benign prostatic hypertrophy)   . Diabetes mellitus   . Dialysis patient [facility])    Tuesday, Thursday and Saturday,   . DVT of leg (deep venous thrombosis) ([place]) 12/25/2016  . Edema     history of lower extremity edema  . GERD (gastroesophageal reflux disease)   . Heme positive stool   . Hydronephrosis   . Hyperkalemia   . Hyperlipidemia   . Hypernatremia   . Hypertension   . Hypothyroidism   . Impaired speech   . 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 MR (mental retardation)   . Muscle weakness   . Obstructive uropathy   . 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 Poor historian poor historian  . Protein calorie malnutrition ([place])   . Pyelonephritis   . Renal failure (ARF), acute on chronic (HCC)   . Renal insufficiency    chronic history  . Sepsis ([place])   . Smoking   . Uremia   . Urinary retention   . UTI (lower urinary tract infection) 10/06/2011    Past Surgical History:  Procedure Laterality Date  . A/V FISTULAGRAM N/A 08/13/2018   Procedure: A/V FISTULAGRAM - Right Upper;  Surgeon: Elam Dutch, MD;  Location: [place] [facility];  Service: Cardiovascular;  Laterality: N/A;  . A/V FISTULAGRAM N/A 11/22/2018   Procedure: A/V FISTULAGRAM - Right Upper;  Surgeon: Waynetta Sandy, MD;  Location: [place] [facility];  Service: Cardiovascular;  Laterality: N/A;  . AV FISTULA PLACEMENT Left 07/06/2015   Procedure:  INSERTION LEFT ARM ARTERIOVENOUS GORTEX GRAFT;  Surgeon: Angelia Mould, MD;  Location: [place];  Service: Vascular;  Laterality: Left;  . AV FISTULA PLACEMENT Right 02/26/2016   Procedure: ARTERIOVENOUS (AV) FISTULA CREATION ;  Surgeon: Angelia Mould, MD;  Location: [place];  Service: Vascular;  Laterality: Right;  . AV FISTULA PLACEMENT Right 11/25/2018   Procedure: INSERTION OF ARTERIOVENOUS (AV) ARTEGRAFT RIGHT UPPER ARM;  Surgeon: Waynetta Sandy, MD;  Location: [place];  Service: Vascular;  Laterality: Right;  . [place] REMOVAL Left 10/09/2015   Procedure: REMOVAL OF ARTERIOVENOUS GORETEX GRAFT ([place]) Evacuation of Lymphocele, Vein Patch angioplasty of brachial artery.;  Surgeon: Angelia Mould, MD;  Location: [place];  Service: Vascular;  Laterality: Left;  . BASCILIC VEIN TRANSPOSITION Right 02/26/2016   Procedure: Right BASCILIC VEIN TRANSPOSITION;  Surgeon: Angelia Mould, MD;  Location: [place];  Service: Vascular;  Laterality: Right;  . CIRCUMCISION N/A 01/04/2014   Procedure: CIRCUMCISION ADULT (procedure #1);  Surgeon: Marissa Nestle,  MD;  [facility];  Service: Urology;  Laterality: N/A;  . COLECTOMY N/A 05/04/2017   Procedure: TOTAL COLECTOMY;  Surgeon: Aviva Signs, MD;  Location[facility];  Service: General;  Laterality: N/A;  . COLONOSCOPY N/A 04/27/2017   Procedure: COLONOSCOPY;  Surgeon: Daneil Dolin, MD;  [facility];  Service: Endoscopy;  Laterality: N/A;  245  . CYSTOSCOPY W/ RETROGRADES Bilateral 06/29/2015   Procedure: CYSTOSCOPY, DILATION OF URETHRAL STRICTURE WITH BILATERAL RETROGRADE PYELOGRAM,SUPRAPUBIC TUBE CHANGE;  Surgeon: Festus Aloe,  MD;  [facility];  Service: Urology;  Laterality: Bilateral;  . CYSTOSCOPY WITH URETHRAL DILATATION N/A 12/29/2013   Procedure: CYSTOSCOPY WITH URETHRAL DILATATION;  Surgeon: Marissa Nestle, MD;  [facility];  Service: Urology;  Laterality: N/A;  . ESOPHAGOGASTRODUODENOSCOPY N/A 04/27/2017   Procedure: ESOPHAGOGASTRODUODENOSCOPY (EGD);  Surgeon: Daneil Dolin, MD;  [facility];  Service: Endoscopy;  Laterality: N/A;  . INSERTION OF DIALYSIS CATHETER Right 11/25/2018   Procedure: INSERTION OF DIALYSIS CATHETER RIGHT INTERNAL JUGULAR;  Surgeon: Waynetta Sandy, MD;  Location: [place];  Service: Vascular;  Laterality: Right;  . IR AV DIALY SHUNT INTRO [place] W/PTA/IMG RIGHT Right 09/07/2018  . IR REMOVAL TUN CV CATH W/O FL  01/12/2019  . IR THROMBECTOMY AV FISTULA W/THROMBOLYSIS/PTA INC/SHUNT/IMG RIGHT Right 04/26/2018  . IR US GUIDE VASC ACCESS RIGHT  04/26/2018  . IR US GUIDE VASC ACCESS RIGHT  09/07/2018  . ORIF FEMUR FRACTURE Right 11/22/2016   Procedure: OPEN REDUCTION INTERNAL FIXATION (ORIF) DISTAL FEMUR FRACTURE;  Surgeon: Rod Can, MD;  Location: [place];  Service: Orthopedics;  Laterality: Right;  . PATCH ANGIOPLASTY Right 12/10/2017   Procedure: PATCH ANGIOPLASTY;  Surgeon: Angelia Mould, MD;  Location: [facility];  Service: Vascular;  Laterality: Right;  . PERIPHERAL VASCULAR BALLOON ANGIOPLASTY  08/13/2018   Procedure: PERIPHERAL VASCULAR BALLOON ANGIOPLASTY;  Surgeon: Elam Dutch, MD;  Location: [place] [facility];  Service: Cardiovascular;;  right AV fistula   . PERIPHERAL VASCULAR BALLOON ANGIOPLASTY  11/22/2018   Procedure: PERIPHERAL VASCULAR BALLOON ANGIOPLASTY;  Surgeon: Waynetta Sandy, MD;  Location: [place] [facility];  Service: Cardiovascular;;  rt AV fistula  . PERIPHERAL VASCULAR CATHETERIZATION N/A 10/08/2015   Procedure: A/V Shuntogram;  Surgeon: Angelia Mould, MD;  Location: [place] [facility];   Service: Cardiovascular;  Laterality: N/A;  . THROMBECTOMY W/ EMBOLECTOMY Right 12/10/2017   Procedure: THROMBECTOMY REVISION RIGHT ARM  ARTERIOVENOUS FISTULA;  Surgeon: Angelia Mould, MD;  Location: [place];  Service: Vascular;  Laterality: Right;  . TRANSURETHRAL RESECTION OF PROSTATE N/A 01/04/2014   Procedure: TRANSURETHRAL RESECTION OF THE PROSTATE (TURP) (procedure #2);  Surgeon: Marissa Nestle, MD;  [facility];  Service: Urology;  Laterality: N/A;    Social History   Socioeconomic History  . Marital status: Single    Spouse name: Not on file  . Number of children: Not on file  . Years of education: Not on file  . Highest education level: Not on file  Occupational History  . Occupation: retired   Tobacco Use  . Smoking status: Never Smoker  . Smokeless tobacco: Never Used  Vaping Use  . Vaping Use: Never used  Substance and Sexual Activity  . Alcohol use: No  . Drug use: No  . Sexual activity: Not Currently  Other Topics Concern  . 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  [facility]    Social Determinants of Health   Financial Resource Strain:   . Difficulty of Paying Living Expenses:   Food Insecurity:   . Worried About Charity fundraiser in the Last Year:   . Arboriculturist in the Last Year:   Transportation Needs:   . Film/video editor (Medical):   Marland Kitchen Lack of Transportation (Non-Medical):   Physical Activity: Unknown  . Days of Exercise per Week: Not on file  . Minutes of Exercise per Session: 0 min  Stress:   . Feeling of Stress :   Social Connections: Unknown  . Frequency of Communication with Friends and Family: Not on file  . Frequency of Social Gatherings with Friends and Family: Never  . Attends Religious Services: Not on file  . Active Member of Clubs or Organizations: Not on file  . Attends Archivist Meetings: Not on file  .  Marital Status: Not on file  Intimate Partner Violence:   . Fear of Current or Ex-Partner:   . Emotionally Abused:   Marland Kitchen Physically Abused:   . Sexually Abused:    Family History  Problem Relation Age of Onset  . Cancer Mother   . Colon cancer Neg Hx       VITAL SIGNS BP (!) 103/56   Pulse 79   Temp 98.3 F (36.8 C) (Oral)   Resp 18   Ht 5\' 8"  (1.727 m)   Wt 217 lb 12.8 oz (98.8 kg)   SpO2 95%   BMI 33.12 kg/m   Outpatient Encounter Medications as of 05/03/2020  Medication Sig  . atorvastatin (LIPITOR) 80 MG tablet Take 80 mg by mouth every evening.  . insulin aspart (NOVOLOG FLEXPEN) 100 UNIT/ML FlexPen Inject 10 Units into the skin 3 (three) times daily with meals. Hold insulin on dialysis days if cbg's less than 150 on mon,wed,fri  . insulin aspart (NOVOLOG FLEXPEN) 100 UNIT/ML FlexPen insulin pen; 100 unit/mL (3 mL); amt: 10 units; subcutaneous Special Instructions: with meals IF CBG'S MOER THAN 150 Three Times A Day  . Insulin Glargine (BASAGLAR KWIKPEN) 100 UNIT/ML SOPN Inject 35 Units into the skin at bedtime.   Marland Kitchen JANUVIA 25 MG tablet Take 25 mg by mouth daily.   Marland Kitchen levothyroxine (SYNTHROID, LEVOTHROID) 88 MCG tablet Take 88 mcg by mouth daily before breakfast.  . midodrine (PROAMATINE) 10 MG tablet Take 10 mg by mouth. Send med with resident on Mon/Wed/Fri to dialysis. do not give [facility] give to resident to take with him. send with 5mg  to equal 15mg .  . midodrine (PROAMATINE) 5 MG tablet Take 5 mg by mouth. Send with resident on dialysis days.  Mon, Wed, Fri.  Take along with 10 mg to equal 15 mg  . NON FORMULARY Fluid Restriction - 1200 cc a day 500 cc for days, Evenings 500 cc, Night 200 cc  . NON FORMULARY Diet Type:  NAS, Cons CHO Diet  . Nutritional Supplements (NEPRO/CARBSTEADY PO) Take 1 Can by mouth 2 (two) times daily.  Marland Kitchen omega-3 acid ethyl esters (LOVAZA) 1 g capsule Take 2 g by mouth at bedtime.  Marland Kitchen omeprazole (PRILOSEC) 40 MG capsule Take 40 mg by  mouth daily.   . polyethylene glycol (MIRALAX / GLYCOLAX) packet Take 17 g by mouth as needed for mild constipation.   . sevelamer carbonate (RENVELA) 800 MG tablet Take 1,600 mg by mouth 3 (three) times daily.   . sevelamer carbonate (RENVELA) 800  MG tablet Take 800 mg by mouth daily. With snacks  . tamsulosin (FLOMAX) 0.4 MG CAPS capsule Take 0.4 mg by mouth every evening. Give 30 minutes after a meal  . torsemide (DEMADEX) 10 MG tablet Take 5 tablets (50 mg total) by mouth daily.  . [DISCONTINUED] sertraline (ZOLOFT) 50 MG tablet Take 25 mg by mouth daily.    No facility-administered encounter medications on file as of 05/03/2020.     SIGNIFICANT DIAGNOSTIC EXAMS  LABS REVIEWED PREVIOUS;  05-10-19 hgb a1c 7.6   06-03-19: wbc 6.3; hgb 10.8; hct 33.4; mcv 106.7; plt 132; glucose 160; bun 30; creat 8.46; k+ 4.6; na++ 137; ca 9.1; liver normal albumin 3.6; chol 115; LDL 25.5; trig 531; hdl 24; PSA 2.73 09-07-19: hgb a1c 7.5 09-16-19: wbc 8.0; hgb 16.4; hct 50.6; mcv 107.2 plt 141; glucose 165; bun 40; creat 10.47; k+ 3.7; na++ 135; ca 9.3; liver normal albumin 4.6; stool guaiac neg  12-09-19: hgb a1c 6.9; hocl 94; ldl 4 trig 339; hdl 22 tsh 3.202  TODAY  04-09-20: wbc 6.2; hgb 10.7; hct 33.5; mcv 109.1 plt 144;glucose 185; bun 43; creat 10.75; k+ 4.3; na++ 137; ca 8.7 liver normal albumin 3.7 hgb a1c 7.5   Review of Systems  Constitutional: Negative for malaise/fatigue.  Respiratory: Negative for cough and shortness of breath.   Cardiovascular: Negative for chest pain, palpitations and leg swelling.  Gastrointestinal: Negative for abdominal pain, constipation and heartburn.  Musculoskeletal: Negative for back pain, joint pain and myalgias.  Skin: Negative.   Neurological: Negative for dizziness.  Psychiatric/Behavioral: The patient is not nervous/anxious.     Physical Exam Constitutional:      General: He is not in acute distress.    Appearance: He is well-developed. He is not  diaphoretic.  Neck:     Thyroid: No thyromegaly.  Cardiovascular:     Rate and Rhythm: Normal rate and regular rhythm.     Pulses: Normal pulses.     Heart sounds: Normal heart sounds.  Pulmonary:     Effort: Pulmonary effort is normal. No respiratory distress.     Breath sounds: Normal breath sounds.  Abdominal:     General: Bowel sounds are normal. There is no distension.     Palpations: Abdomen is soft.     Tenderness: There is no abdominal tenderness.  Genitourinary:    Comments: History of turp Musculoskeletal:     Cervical back: Neck supple.     Right lower leg: No edema.     Left lower leg: No edema.     Comments: Is able to move all extremities History of right femur ORIF       Lymphadenopathy:     Cervical: No cervical adenopathy.  Skin:    General: Skin is warm and dry.     Comments: Right upper extremity a/v fistula + thrill + bruit    Neurological:     Mental Status: He is alert. Mental status is at baseline.  Psychiatric:        Mood and Affect: Mood normal.         ASSESSMENT/ PLAN:  TODAY  1. Bilateral lower extremity edema: is stable will continue demadex 50 mg daily   2. Orthostatic hypotension; is stable b/p 103/56 will continue midodrine 15 mg with dialysis  3. GERD without esophagitis: is stable will continue prilosec 40 mg daily    PREVIOUS   4. Type 2 diabetes mellitus with hypertension and end stage renal disease is stable hgb  a1c 6.9 will continue januvia 25 mg daily novolog 8 units with meals and basaglar 30 units nightly   5. Dyslipidemia associated with type 2 diabetes mellitus: is stable trig 339; will continue lipitor 80 mg daily and lovaza 2 gm daily   6. End stage renal disease on hemodialysis due to type 2 diabetes mellitus is stable will continue dialysis 3 days weekly followed by nephrology will continue midodrine 15 mg with dialysis 1200 cc fluid restriction renvela 1600 mg with meals and 800 mg with snacks.   7. Other  specified hypothyroidism: is stable tsh 3.202 with continue synthroid 88 mcg daily   8. Anemia of chronic disease on chronic dialysis is stable hgb 10.7 will monitor  9. Major depression single episode moderate: is stable is off zoloft will monitor        MD is aware of resident's narcotic use and is in agreement with current plan of care. We will attempt to wean resident as appropriate.  Ok Edwards NP [facility]  Contact [phone] Monday through Friday 8am- 5pm  After hours call [phone]

## 2020-05-04 DIAGNOSIS — D631 Anemia in chronic kidney disease: Secondary | ICD-10-CM | POA: Diagnosis not present

## 2020-05-04 DIAGNOSIS — N186 End stage renal disease: Secondary | ICD-10-CM | POA: Diagnosis not present

## 2020-05-04 DIAGNOSIS — N2581 Secondary hyperparathyroidism of renal origin: Secondary | ICD-10-CM | POA: Diagnosis not present

## 2020-05-04 DIAGNOSIS — D509 Iron deficiency anemia, unspecified: Secondary | ICD-10-CM | POA: Diagnosis not present

## 2020-05-04 DIAGNOSIS — Z992 Dependence on renal dialysis: Secondary | ICD-10-CM | POA: Diagnosis not present

## 2020-05-07 DIAGNOSIS — D631 Anemia in chronic kidney disease: Secondary | ICD-10-CM | POA: Diagnosis not present

## 2020-05-07 DIAGNOSIS — Z992 Dependence on renal dialysis: Secondary | ICD-10-CM | POA: Diagnosis not present

## 2020-05-07 DIAGNOSIS — E119 Type 2 diabetes mellitus without complications: Secondary | ICD-10-CM | POA: Diagnosis not present

## 2020-05-07 DIAGNOSIS — N186 End stage renal disease: Secondary | ICD-10-CM | POA: Diagnosis not present

## 2020-05-07 DIAGNOSIS — N2581 Secondary hyperparathyroidism of renal origin: Secondary | ICD-10-CM | POA: Diagnosis not present

## 2020-05-07 DIAGNOSIS — D509 Iron deficiency anemia, unspecified: Secondary | ICD-10-CM | POA: Diagnosis not present

## 2020-05-09 DIAGNOSIS — N186 End stage renal disease: Secondary | ICD-10-CM | POA: Diagnosis not present

## 2020-05-09 DIAGNOSIS — D509 Iron deficiency anemia, unspecified: Secondary | ICD-10-CM | POA: Diagnosis not present

## 2020-05-09 DIAGNOSIS — Z992 Dependence on renal dialysis: Secondary | ICD-10-CM | POA: Diagnosis not present

## 2020-05-09 DIAGNOSIS — N2581 Secondary hyperparathyroidism of renal origin: Secondary | ICD-10-CM | POA: Diagnosis not present

## 2020-05-09 DIAGNOSIS — D631 Anemia in chronic kidney disease: Secondary | ICD-10-CM | POA: Diagnosis not present

## 2020-05-11 DIAGNOSIS — D509 Iron deficiency anemia, unspecified: Secondary | ICD-10-CM | POA: Diagnosis not present

## 2020-05-11 DIAGNOSIS — N2581 Secondary hyperparathyroidism of renal origin: Secondary | ICD-10-CM | POA: Diagnosis not present

## 2020-05-11 DIAGNOSIS — D631 Anemia in chronic kidney disease: Secondary | ICD-10-CM | POA: Diagnosis not present

## 2020-05-11 DIAGNOSIS — Z992 Dependence on renal dialysis: Secondary | ICD-10-CM | POA: Diagnosis not present

## 2020-05-11 DIAGNOSIS — N186 End stage renal disease: Secondary | ICD-10-CM | POA: Diagnosis not present

## 2020-05-14 DIAGNOSIS — D631 Anemia in chronic kidney disease: Secondary | ICD-10-CM | POA: Diagnosis not present

## 2020-05-14 DIAGNOSIS — N2581 Secondary hyperparathyroidism of renal origin: Secondary | ICD-10-CM | POA: Diagnosis not present

## 2020-05-14 DIAGNOSIS — D509 Iron deficiency anemia, unspecified: Secondary | ICD-10-CM | POA: Diagnosis not present

## 2020-05-14 DIAGNOSIS — Z992 Dependence on renal dialysis: Secondary | ICD-10-CM | POA: Diagnosis not present

## 2020-05-14 DIAGNOSIS — N186 End stage renal disease: Secondary | ICD-10-CM | POA: Diagnosis not present

## 2020-05-16 DIAGNOSIS — N2581 Secondary hyperparathyroidism of renal origin: Secondary | ICD-10-CM | POA: Diagnosis not present

## 2020-05-16 DIAGNOSIS — D631 Anemia in chronic kidney disease: Secondary | ICD-10-CM | POA: Diagnosis not present

## 2020-05-16 DIAGNOSIS — N186 End stage renal disease: Secondary | ICD-10-CM | POA: Diagnosis not present

## 2020-05-16 DIAGNOSIS — D509 Iron deficiency anemia, unspecified: Secondary | ICD-10-CM | POA: Diagnosis not present

## 2020-05-16 DIAGNOSIS — Z992 Dependence on renal dialysis: Secondary | ICD-10-CM | POA: Diagnosis not present

## 2020-05-18 DIAGNOSIS — N2581 Secondary hyperparathyroidism of renal origin: Secondary | ICD-10-CM | POA: Diagnosis not present

## 2020-05-18 DIAGNOSIS — N186 End stage renal disease: Secondary | ICD-10-CM | POA: Diagnosis not present

## 2020-05-18 DIAGNOSIS — Z992 Dependence on renal dialysis: Secondary | ICD-10-CM | POA: Diagnosis not present

## 2020-05-18 DIAGNOSIS — D631 Anemia in chronic kidney disease: Secondary | ICD-10-CM | POA: Diagnosis not present

## 2020-05-18 DIAGNOSIS — D509 Iron deficiency anemia, unspecified: Secondary | ICD-10-CM | POA: Diagnosis not present

## 2020-05-21 DIAGNOSIS — Z992 Dependence on renal dialysis: Secondary | ICD-10-CM | POA: Diagnosis not present

## 2020-05-21 DIAGNOSIS — N186 End stage renal disease: Secondary | ICD-10-CM | POA: Diagnosis not present

## 2020-05-21 DIAGNOSIS — D509 Iron deficiency anemia, unspecified: Secondary | ICD-10-CM | POA: Diagnosis not present

## 2020-05-21 DIAGNOSIS — D631 Anemia in chronic kidney disease: Secondary | ICD-10-CM | POA: Diagnosis not present

## 2020-05-21 DIAGNOSIS — N2581 Secondary hyperparathyroidism of renal origin: Secondary | ICD-10-CM | POA: Diagnosis not present

## 2020-05-23 DIAGNOSIS — N186 End stage renal disease: Secondary | ICD-10-CM | POA: Diagnosis not present

## 2020-05-23 DIAGNOSIS — N2581 Secondary hyperparathyroidism of renal origin: Secondary | ICD-10-CM | POA: Diagnosis not present

## 2020-05-23 DIAGNOSIS — D631 Anemia in chronic kidney disease: Secondary | ICD-10-CM | POA: Diagnosis not present

## 2020-05-23 DIAGNOSIS — D509 Iron deficiency anemia, unspecified: Secondary | ICD-10-CM | POA: Diagnosis not present

## 2020-05-23 DIAGNOSIS — Z992 Dependence on renal dialysis: Secondary | ICD-10-CM | POA: Diagnosis not present

## 2020-05-25 DIAGNOSIS — D509 Iron deficiency anemia, unspecified: Secondary | ICD-10-CM | POA: Diagnosis not present

## 2020-05-25 DIAGNOSIS — Z992 Dependence on renal dialysis: Secondary | ICD-10-CM | POA: Diagnosis not present

## 2020-05-25 DIAGNOSIS — N186 End stage renal disease: Secondary | ICD-10-CM | POA: Diagnosis not present

## 2020-05-25 DIAGNOSIS — N2581 Secondary hyperparathyroidism of renal origin: Secondary | ICD-10-CM | POA: Diagnosis not present

## 2020-05-25 DIAGNOSIS — D631 Anemia in chronic kidney disease: Secondary | ICD-10-CM | POA: Diagnosis not present

## 2020-05-26 DIAGNOSIS — N186 End stage renal disease: Secondary | ICD-10-CM | POA: Diagnosis not present

## 2020-05-26 DIAGNOSIS — Z992 Dependence on renal dialysis: Secondary | ICD-10-CM | POA: Diagnosis not present

## 2020-05-28 DIAGNOSIS — N186 End stage renal disease: Secondary | ICD-10-CM | POA: Diagnosis not present

## 2020-05-28 DIAGNOSIS — Z992 Dependence on renal dialysis: Secondary | ICD-10-CM | POA: Diagnosis not present

## 2020-05-28 DIAGNOSIS — D509 Iron deficiency anemia, unspecified: Secondary | ICD-10-CM | POA: Diagnosis not present

## 2020-05-28 DIAGNOSIS — D631 Anemia in chronic kidney disease: Secondary | ICD-10-CM | POA: Diagnosis not present

## 2020-05-28 DIAGNOSIS — N2581 Secondary hyperparathyroidism of renal origin: Secondary | ICD-10-CM | POA: Diagnosis not present

## 2020-05-30 DIAGNOSIS — D509 Iron deficiency anemia, unspecified: Secondary | ICD-10-CM | POA: Diagnosis not present

## 2020-05-30 DIAGNOSIS — D631 Anemia in chronic kidney disease: Secondary | ICD-10-CM | POA: Diagnosis not present

## 2020-05-30 DIAGNOSIS — N2581 Secondary hyperparathyroidism of renal origin: Secondary | ICD-10-CM | POA: Diagnosis not present

## 2020-05-30 DIAGNOSIS — Z992 Dependence on renal dialysis: Secondary | ICD-10-CM | POA: Diagnosis not present

## 2020-05-30 DIAGNOSIS — N186 End stage renal disease: Secondary | ICD-10-CM | POA: Diagnosis not present

## 2020-06-01 DIAGNOSIS — D509 Iron deficiency anemia, unspecified: Secondary | ICD-10-CM | POA: Diagnosis not present

## 2020-06-01 DIAGNOSIS — D631 Anemia in chronic kidney disease: Secondary | ICD-10-CM | POA: Diagnosis not present

## 2020-06-01 DIAGNOSIS — N2581 Secondary hyperparathyroidism of renal origin: Secondary | ICD-10-CM | POA: Diagnosis not present

## 2020-06-01 DIAGNOSIS — N186 End stage renal disease: Secondary | ICD-10-CM | POA: Diagnosis not present

## 2020-06-01 DIAGNOSIS — Z992 Dependence on renal dialysis: Secondary | ICD-10-CM | POA: Diagnosis not present

## 2020-06-04 DIAGNOSIS — N2581 Secondary hyperparathyroidism of renal origin: Secondary | ICD-10-CM | POA: Diagnosis not present

## 2020-06-04 DIAGNOSIS — D509 Iron deficiency anemia, unspecified: Secondary | ICD-10-CM | POA: Diagnosis not present

## 2020-06-04 DIAGNOSIS — N186 End stage renal disease: Secondary | ICD-10-CM | POA: Diagnosis not present

## 2020-06-04 DIAGNOSIS — D631 Anemia in chronic kidney disease: Secondary | ICD-10-CM | POA: Diagnosis not present

## 2020-06-04 DIAGNOSIS — Z992 Dependence on renal dialysis: Secondary | ICD-10-CM | POA: Diagnosis not present

## 2020-06-05 ENCOUNTER — Non-Acute Institutional Stay (SKILLED_NURSING_FACILITY): Payer: Medicare Other | Admitting: Adult Health

## 2020-06-05 ENCOUNTER — Encounter: Payer: Self-pay | Admitting: Adult Health

## 2020-06-05 DIAGNOSIS — F321 Major depressive disorder, single episode, moderate: Secondary | ICD-10-CM | POA: Diagnosis not present

## 2020-06-05 DIAGNOSIS — E034 Atrophy of thyroid (acquired): Secondary | ICD-10-CM

## 2020-06-05 DIAGNOSIS — E11319 Type 2 diabetes mellitus with unspecified diabetic retinopathy without macular edema: Secondary | ICD-10-CM

## 2020-06-05 DIAGNOSIS — Z992 Dependence on renal dialysis: Secondary | ICD-10-CM | POA: Diagnosis not present

## 2020-06-05 DIAGNOSIS — I951 Orthostatic hypotension: Secondary | ICD-10-CM

## 2020-06-05 DIAGNOSIS — N186 End stage renal disease: Secondary | ICD-10-CM | POA: Diagnosis not present

## 2020-06-05 DIAGNOSIS — I120 Hypertensive chronic kidney disease with stage 5 chronic kidney disease or end stage renal disease: Secondary | ICD-10-CM

## 2020-06-05 DIAGNOSIS — K219 Gastro-esophageal reflux disease without esophagitis: Secondary | ICD-10-CM

## 2020-06-05 DIAGNOSIS — C189 Malignant neoplasm of colon, unspecified: Secondary | ICD-10-CM

## 2020-06-05 DIAGNOSIS — E1122 Type 2 diabetes mellitus with diabetic chronic kidney disease: Secondary | ICD-10-CM

## 2020-06-05 DIAGNOSIS — Z794 Long term (current) use of insulin: Secondary | ICD-10-CM

## 2020-06-05 DIAGNOSIS — E1169 Type 2 diabetes mellitus with other specified complication: Secondary | ICD-10-CM

## 2020-06-05 DIAGNOSIS — Z9049 Acquired absence of other specified parts of digestive tract: Secondary | ICD-10-CM | POA: Diagnosis not present

## 2020-06-05 DIAGNOSIS — D631 Anemia in chronic kidney disease: Secondary | ICD-10-CM

## 2020-06-05 DIAGNOSIS — E785 Hyperlipidemia, unspecified: Secondary | ICD-10-CM

## 2020-06-05 NOTE — Progress Notes (Signed)
Provider: Phillips Grout NP  Location   [facility]  PCP: Gerlene Fee, NP  Extended Emergency Contact Information Primary Emergency Contact: Price,Melissa Address: [address]  [place] Phone: [phone] Work Phone: [phone] Relation: Legal Guardian Secondary Emergency Contact: Wannamaker,Richard Address: [address]          [place], [place] [address] [place] Phone: [phone] Relation: Brother  Codes status: Full Code Goals of care: advanced directive information Advanced Directives 06/05/2020  Does Patient Have a Medical Advance Directive? Yes  Type of Advance Directive (No Data)  Does patient want to make changes to medical advance directive? No - Patient declined  Copy of [place] in Chart? -  Would patient like information on creating a medical advance directive? -  Pre-existing out of facility DNR order (yellow form or pink MOST form) -     Allergies  Allergen Reactions   No Known Allergies     Chief Complaint  Patient presents with   Annual Exam    Annual Exam    HPI  He is a 74 year old long term resident of this facility being seen for his annual exam. He has not been hospitalized over the past year. His weight is stable without significant change. He continues with dialysis three times weekly. No reports of recent falls. He denies any pain. No changes in appetite; no constipation; no heart burn; no anxiety no depressive thoughts. He continues to be followed for his chronic illnesses including: diabetes; hyperlipidemia; esrd; hypothyroidism; anemia.     Past Medical History:  Diagnosis Date   Abnormal CT scan, kidney 10/06/2011   Acute pyelonephritis 10/07/2011   Anemia    normocytic   Anxiety    mental retardation   Bladder wall thickening 10/06/2011   BPH (benign prostatic hypertrophy)    Diabetes mellitus    Dialysis patient ([place])    Tuesday, Thursday and  Saturday,    DVT of leg (deep venous thrombosis) ([place] 12/25/2016   Edema     history of lower extremity edema   GERD (gastroesophageal reflux disease)    Heme positive stool    Hydronephrosis    Hyperkalemia    Hyperlipidemia    Hypernatremia    Hypertension    Hypothyroidism    Impaired speech    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[place])    MR (mental retardation)    Muscle weakness    Obstructive uropathy    Perinephric abscess 10/07/2011   Poor historian poor historian   Protein calorie malnutrition ([place])    Pyelonephritis    Renal failure (ARF), acute on chronic (HCC)    Renal insufficiency    chronic history   Sepsis ([place])    Smoking    Uremia    Urinary retention    UTI (lower urinary tract infection) 10/06/2011   Past Surgical History:  Procedure Laterality Date   A/V FISTULAGRAM N/A 08/13/2018   Procedure: A/V FISTULAGRAM - Right Upper;  Surgeon: Elam Dutch, MD;  Location: [facility];  Service: Cardiovascular;  Laterality: N/A;   A/V FISTULAGRAM N/A 11/22/2018   Procedure: A/V FISTULAGRAM - Right Upper;  Surgeon: Waynetta Sandy, MD;  Location: [facility];  Service: Cardiovascular;  Laterality: N/A;   AV FISTULA PLACEMENT Left 07/06/2015   Procedure:  INSERTION LEFT ARM ARTERIOVENOUS GORTEX GRAFT;  Surgeon: Angelia Mould, MD;  Location: [facility];  Service: Vascular;  Laterality: Left;  AV FISTULA PLACEMENT Right 02/26/2016   Procedure: ARTERIOVENOUS (AV) FISTULA CREATION ;  Surgeon: Angelia Mould, MD;  Location: [facility];  Service: Vascular;  Laterality: Right;   AV FISTULA PLACEMENT Right 11/25/2018   Procedure: INSERTION OF ARTERIOVENOUS (AV) ARTEGRAFT RIGHT UPPER ARM;  Surgeon: Waynetta Sandy, MD;  Location: [facility];  Service: Vascular;  Laterality: Right;   [place] REMOVAL Left 10/09/2015   Procedure: REMOVAL OF ARTERIOVENOUS GORETEX GRAFT ([place]) Evacuation of Lymphocele, Vein Patch  angioplasty of brachial artery.;  Surgeon: Angelia Mould, MD;  Location: [facility];  Service: Vascular;  Laterality: Left;   [place] Right 02/26/2016   Procedure: Right [place];  Surgeon: Angelia Mould, MD;  Location: [facility];  Service: Vascular;  Laterality: Right;   CIRCUMCISION N/A 01/04/2014   Procedure: CIRCUMCISION ADULT (procedure #1);  Surgeon: Marissa Nestle, MD;  Location[facility];  Service: Urology;  Laterality: N/A;   COLECTOMY N/A 05/04/2017   Procedure: TOTAL COLECTOMY;  Surgeon: Aviva Signs, MD;  Location[facility];  Service: General;  Laterality: N/A;   COLONOSCOPY N/A 04/27/2017   Procedure: COLONOSCOPY;  Surgeon: Daneil Dolin, MD;  [facility];  Service: Endoscopy;  Laterality: N/A;  245   CYSTOSCOPY W/ RETROGRADES Bilateral 06/29/2015   Procedure: CYSTOSCOPY, DILATION OF URETHRAL STRICTURE WITH BILATERAL RETROGRADE PYELOGRAM,SUPRAPUBIC TUBE CHANGE;  Surgeon: Festus Aloe, MD;  Location: [facility];  Service: Urology;  Laterality: Bilateral;   CYSTOSCOPY WITH URETHRAL DILATATION N/A 12/29/2013   Procedure: CYSTOSCOPY WITH URETHRAL DILATATION;  Surgeon: Marissa Nestle, MD;  [facility];  Service: Urology;  Laterality: N/A;   ESOPHAGOGASTRODUODENOSCOPY N/A 04/27/2017   Procedure: ESOPHAGOGASTRODUODENOSCOPY (EGD);  Surgeon: Daneil Dolin, MD;  [facility];  Service: Endoscopy;  Laterality: N/A;   INSERTION OF DIALYSIS CATHETER Right 11/25/2018   Procedure: INSERTION OF DIALYSIS CATHETER RIGHT INTERNAL JUGULAR;  Surgeon: Waynetta Sandy, MD;  [facility];  Service: Vascular;  Laterality: Right;   IR AV DIALY SHUNT INTRO NEEDLE/INTRACATH INITIAL W/PTA/IMG RIGHT Right 09/07/2018   IR REMOVAL TUN CV CATH W/O FL  01/12/2019   IR THROMBECTOMY AV FISTULA W/THROMBOLYSIS/PTA INC/SHUNT/IMG RIGHT Right 04/26/2018   IR US GUIDE VASC ACCESS RIGHT  04/26/2018   IR US GUIDE VASC ACCESS RIGHT   09/07/2018   ORIF FEMUR FRACTURE Right 11/22/2016   Procedure: OPEN REDUCTION INTERNAL FIXATION (ORIF) DISTAL FEMUR FRACTURE;  Surgeon: Rod Can, MD;  Location: [facility];  Service: Orthopedics;  Laterality: Right;   PATCH ANGIOPLASTY Right 12/10/2017   Procedure: PATCH ANGIOPLASTY;  Surgeon: Angelia Mould, MD;  Location: [facility];  Service: Vascular;  Laterality: Right;   PERIPHERAL VASCULAR BALLOON ANGIOPLASTY  08/13/2018   Procedure: PERIPHERAL VASCULAR BALLOON ANGIOPLASTY;  Surgeon: Elam Dutch, MD;  Location: [facility];  Service: Cardiovascular;;  right AV fistula    PERIPHERAL VASCULAR BALLOON ANGIOPLASTY  11/22/2018   Procedure: PERIPHERAL VASCULAR BALLOON ANGIOPLASTY;  Surgeon: Waynetta Sandy, MD;  Location: [facility];  Service: Cardiovascular;;  rt AV fistula   PERIPHERAL VASCULAR CATHETERIZATION N/A 10/08/2015   Procedure: A/V Shuntogram;  Surgeon: Angelia Mould, MD;  Location: [facility];  Service: Cardiovascular;  Laterality: N/A;   THROMBECTOMY W/ EMBOLECTOMY Right 12/10/2017   Procedure: THROMBECTOMY REVISION RIGHT ARM  ARTERIOVENOUS FISTULA;  Surgeon: Angelia Mould, MD;  [facility];  Service: Vascular;  Laterality: Right;   TRANSURETHRAL RESECTION OF PROSTATE N/A 01/04/2014  Procedure: TRANSURETHRAL RESECTION OF THE PROSTATE (TURP) (procedure #2);  Surgeon: Marissa Nestle, MD;  [facility];  Service: Urology;  Laterality: N/A;    reports that he has never smoked. He has never used smokeless tobacco. He reports that he does not drink alcohol and does not use drugs. Social History   Tobacco Use   Smoking status: Never Smoker   Smokeless tobacco: Never Used  Vaping Use   Vaping Use: Never used  Substance Use Topics   Alcohol use: No   Drug use: No   Family History  Problem Relation Age of Onset   Cancer Mother    Colon cancer Neg Hx     Pertinent  Health Maintenance Due  Topic  Date Due   INFLUENZA VACCINE  05/27/2020   FOOT EXAM  08/18/2020   HEMOGLOBIN A1C  10/09/2020   OPHTHALMOLOGY EXAM  11/20/2020   COLONOSCOPY  04/28/2027   PNA vac Low Risk Adult  Completed   URINE MICROALBUMIN  Discontinued   Fall Risk  01/19/2020 01/09/2020 09/15/2019 06/04/2019 05/26/2019  Falls in the past year? 0 0 0 - (No Data)  Comment - - - - [facility] Telephone Survey: data to providers prior to load  Number falls in past yr: - - - 0 (No Data)  Comment - - - - [facility] Telephone Survey Actual Response =   Risk for fall due to : Impaired balance/gait;Impaired mobility Impaired mobility - Impaired mobility -   Depression screen [facility] 2/9 01/19/2020 01/09/2020 09/15/2019 06/04/2019 02/18/2018  Decreased Interest 0 0 0 0 0  Down, Depressed, Hopeless 0 0 0 0 0  PHQ - 2 Score 0 0 0 0 0  Some recent data might be hidden    Functional Status Survey:    Outpatient Encounter Medications as of 06/05/2020  Medication Sig   atorvastatin (LIPITOR) 80 MG tablet Take 80 mg by mouth every evening.   insulin aspart (NOVOLOG FLEXPEN) 100 UNIT/ML FlexPen Inject 10 Units into the skin 3 (three) times daily with meals. Hold insulin on dialysis days if cbg's less than 150 on mon,wed,fri   insulin aspart (NOVOLOG FLEXPEN) 100 UNIT/ML FlexPen insulin pen; 100 unit/mL (3 mL); amt: 10 units; subcutaneous Special Instructions: with meals IF CBG'S MOER THAN 150 Three Times A Day   Insulin Glargine (BASAGLAR KWIKPEN) 100 UNIT/ML SOPN Inject 35 Units into the skin at bedtime.    JANUVIA 25 MG tablet Take 25 mg by mouth daily.    levothyroxine (SYNTHROID, LEVOTHROID) 88 MCG tablet Take 88 mcg by mouth daily before breakfast.   midodrine (PROAMATINE) 10 MG tablet Take 10 mg by mouth. Send med with resident on Mon/Wed/Fri to dialysis. do not give [facility] give to resident to take with him. send with 5mg  to equal 15mg .   midodrine (PROAMATINE) 5 MG tablet Take 5 mg by mouth. Send with resident on dialysis  days.  Mon, Wed, Fri.  Take along with 10 mg to equal 15 mg   NON FORMULARY Fluid Restriction - 1200 cc a day 500 cc for days, Evenings 500 cc, Night 200 cc   NON FORMULARY Diet Type:  NAS, Cons CHO Diet   Nutritional Supplements (NEPRO/CARBSTEADY PO) Take 1 Can by mouth 2 (two) times daily.   omega-3 acid ethyl esters (LOVAZA) 1 g capsule Take 2 g by mouth at bedtime.   omeprazole (PRILOSEC) 40 MG capsule Take 40 mg by mouth daily.    polyethylene glycol (MIRALAX / GLYCOLAX) packet Take 17  g by mouth as needed for mild constipation.    sevelamer carbonate (RENVELA) 800 MG tablet Take 1,600 mg by mouth 3 (three) times daily.    sevelamer carbonate (RENVELA) 800 MG tablet Take 800 mg by mouth daily. With snacks   tamsulosin (FLOMAX) 0.4 MG CAPS capsule Take 0.4 mg by mouth every evening. Give 30 minutes after a meal   torsemide (DEMADEX) 10 MG tablet Take 5 tablets (50 mg total) by mouth daily.   No facility-administered encounter medications on file as of 06/05/2020.     Vitals:   06/05/20 0905  BP: 104/63  Pulse: 86  Resp: 18  Temp: 97.7 F (36.5 C)  TempSrc: Oral  SpO2: 100%  Weight: 216 lb 6.4 oz (98.2 kg)  Height: 5\' 8"  (1.727 m)   Body mass index is 32.9 kg/m.  DIAGNOSTIC EXAMS   LABS REVIEWED PREVIOUS;  06-03-19: wbc 6.3; hgb 10.8; hct 33.4; mcv 106.7; plt 132; glucose 160; bun 30; creat 8.46; k+ 4.6; na++ 137; ca 9.1; liver normal albumin 3.6; chol 115; LDL 25.5; trig 531; hdl 24; PSA 2.73 09-07-19: hgb a1c 7.5 09-16-19: wbc 8.0; hgb 16.4; hct 50.6; mcv 107.2 plt 141; glucose 165; bun 40; creat 10.47; k+ 3.7; na++ 135; ca 9.3; liver normal albumin 4.6; stool guaiac neg  12-09-19: hgb a1c 6.9; hocl 94; ldl 4 trig 339; hdl 22 tsh 3.202 04-09-20: wbc 6.2; hgb 10.7; hct 33.5; mcv 109.1 plt 144;glucose 185; bun 43; creat 10.75; k+ 4.3; na++ 137; ca 8.7 liver normal albumin 3.7 hgb a1c 7.5  NO NEW LABS.    Review of Systems  Constitutional: Negative for  malaise/fatigue.  Respiratory: Negative for cough and shortness of breath.   Cardiovascular: Negative for chest pain, palpitations and leg swelling.  Gastrointestinal: Negative for abdominal pain, constipation and heartburn.  Musculoskeletal: Negative for back pain, joint pain and myalgias.  Skin: Negative.   Neurological: Negative for dizziness.  Psychiatric/Behavioral: The patient is not nervous/anxious.       Physical Exam Constitutional:      General: He is not in acute distress.    Appearance: He is well-developed. He is not diaphoretic.  HENT:     Nose: Nose normal.     Mouth/Throat:     Mouth: Mucous membranes are moist.     Pharynx: Oropharynx is clear.  Eyes:     Conjunctiva/sclera: Conjunctivae normal.  Neck:     Thyroid: No thyromegaly.  Cardiovascular:     Rate and Rhythm: Normal rate and regular rhythm.     Pulses: Normal pulses.     Heart sounds: Normal heart sounds.  Pulmonary:     Effort: Pulmonary effort is normal. No respiratory distress.     Breath sounds: Normal breath sounds.  Abdominal:     General: Bowel sounds are normal. There is no distension.     Palpations: Abdomen is soft.     Tenderness: There is no abdominal tenderness.  Genitourinary:    Comments: History of turp Musculoskeletal:     Cervical back: Neck supple.     Right lower leg: No edema.     Left lower leg: No edema.     Comments: Is able to move all extremities History of right femur ORIF        Lymphadenopathy:     Cervical: No cervical adenopathy.  Skin:    General: Skin is warm and dry.     Comments: Right upper extremity a/v fistula + thrill + bruit  Neurological:     Mental Status: He is alert. Mental status is at baseline.  Psychiatric:        Mood and Affect: Mood normal.      ASSESSMENT/ PLAN:  TODAY  1. 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 Dyslipidemia associated with type 2 diabetes mellitus is stable trig 339; will continue lipitor 80 mg daily and lovaza 2 gm daily   3. 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 is stable will continue dialysis three times weekly and is followed by nephrology will continue midodrein 15 mg with dialysis 1200 cc fluid restriction renvela 1600 mg with meals and 800 mg with snacks.   4. Other specified hypothyroidism is stable tsh 3.202 will continue synthroid 88 mcg daily   5. Anemia of chronic renal  disease on chronic dialysis is stable hgb 10.7 will monitor  6. Major depression single episode moderate; is stable is off zoloft will monitor   7. Bilateral lower extremity edema is stable will continue demadex 50 mg daily   8. Orthostatic hypotension is stable b/p 104/63 will continue midodrine 15 mg with dialysis  9. GERD without esophagitis: is stable will continue prilosec 40 mg daily   10. Malignant neoplasm of colon: is status post colectomy 05-04-17 will monitor    MD is aware of resident's narcotic use and is in agreement with current plan of care. We will attempt to wean resident as appropriate.  Ok Edwards NP [facility]  Contact [phone] Monday through Friday 8am- 5pm  After hours call [phone]

## 2020-06-06 DIAGNOSIS — Z992 Dependence on renal dialysis: Secondary | ICD-10-CM | POA: Diagnosis not present

## 2020-06-06 DIAGNOSIS — N186 End stage renal disease: Secondary | ICD-10-CM | POA: Diagnosis not present

## 2020-06-06 DIAGNOSIS — N2581 Secondary hyperparathyroidism of renal origin: Secondary | ICD-10-CM | POA: Diagnosis not present

## 2020-06-06 DIAGNOSIS — D509 Iron deficiency anemia, unspecified: Secondary | ICD-10-CM | POA: Diagnosis not present

## 2020-06-06 DIAGNOSIS — D631 Anemia in chronic kidney disease: Secondary | ICD-10-CM | POA: Diagnosis not present

## 2020-06-08 DIAGNOSIS — D631 Anemia in chronic kidney disease: Secondary | ICD-10-CM | POA: Diagnosis not present

## 2020-06-08 DIAGNOSIS — N2581 Secondary hyperparathyroidism of renal origin: Secondary | ICD-10-CM | POA: Diagnosis not present

## 2020-06-08 DIAGNOSIS — D509 Iron deficiency anemia, unspecified: Secondary | ICD-10-CM | POA: Diagnosis not present

## 2020-06-08 DIAGNOSIS — N186 End stage renal disease: Secondary | ICD-10-CM | POA: Diagnosis not present

## 2020-06-08 DIAGNOSIS — Z992 Dependence on renal dialysis: Secondary | ICD-10-CM | POA: Diagnosis not present

## 2020-06-11 DIAGNOSIS — N186 End stage renal disease: Secondary | ICD-10-CM | POA: Diagnosis not present

## 2020-06-11 DIAGNOSIS — D509 Iron deficiency anemia, unspecified: Secondary | ICD-10-CM | POA: Diagnosis not present

## 2020-06-11 DIAGNOSIS — D631 Anemia in chronic kidney disease: Secondary | ICD-10-CM | POA: Diagnosis not present

## 2020-06-11 DIAGNOSIS — Z992 Dependence on renal dialysis: Secondary | ICD-10-CM | POA: Diagnosis not present

## 2020-06-11 DIAGNOSIS — N2581 Secondary hyperparathyroidism of renal origin: Secondary | ICD-10-CM | POA: Diagnosis not present

## 2020-06-13 DIAGNOSIS — N186 End stage renal disease: Secondary | ICD-10-CM | POA: Diagnosis not present

## 2020-06-13 DIAGNOSIS — N2581 Secondary hyperparathyroidism of renal origin: Secondary | ICD-10-CM | POA: Diagnosis not present

## 2020-06-13 DIAGNOSIS — D631 Anemia in chronic kidney disease: Secondary | ICD-10-CM | POA: Diagnosis not present

## 2020-06-13 DIAGNOSIS — D509 Iron deficiency anemia, unspecified: Secondary | ICD-10-CM | POA: Diagnosis not present

## 2020-06-13 DIAGNOSIS — Z992 Dependence on renal dialysis: Secondary | ICD-10-CM | POA: Diagnosis not present

## 2020-06-14 DIAGNOSIS — E1129 Type 2 diabetes mellitus with other diabetic kidney complication: Secondary | ICD-10-CM | POA: Diagnosis not present

## 2020-06-14 DIAGNOSIS — I120 Hypertensive chronic kidney disease with stage 5 chronic kidney disease or end stage renal disease: Secondary | ICD-10-CM | POA: Diagnosis not present

## 2020-06-14 DIAGNOSIS — Z1159 Encounter for screening for other viral diseases: Secondary | ICD-10-CM | POA: Diagnosis not present

## 2020-06-15 DIAGNOSIS — D509 Iron deficiency anemia, unspecified: Secondary | ICD-10-CM | POA: Diagnosis not present

## 2020-06-15 DIAGNOSIS — Z992 Dependence on renal dialysis: Secondary | ICD-10-CM | POA: Diagnosis not present

## 2020-06-15 DIAGNOSIS — N186 End stage renal disease: Secondary | ICD-10-CM | POA: Diagnosis not present

## 2020-06-15 DIAGNOSIS — D631 Anemia in chronic kidney disease: Secondary | ICD-10-CM | POA: Diagnosis not present

## 2020-06-15 DIAGNOSIS — N2581 Secondary hyperparathyroidism of renal origin: Secondary | ICD-10-CM | POA: Diagnosis not present

## 2020-06-18 DIAGNOSIS — D509 Iron deficiency anemia, unspecified: Secondary | ICD-10-CM | POA: Diagnosis not present

## 2020-06-18 DIAGNOSIS — I120 Hypertensive chronic kidney disease with stage 5 chronic kidney disease or end stage renal disease: Secondary | ICD-10-CM | POA: Diagnosis not present

## 2020-06-18 DIAGNOSIS — F70 Mild intellectual disabilities: Secondary | ICD-10-CM | POA: Diagnosis not present

## 2020-06-18 DIAGNOSIS — N186 End stage renal disease: Secondary | ICD-10-CM | POA: Diagnosis not present

## 2020-06-18 DIAGNOSIS — D631 Anemia in chronic kidney disease: Secondary | ICD-10-CM | POA: Diagnosis not present

## 2020-06-18 DIAGNOSIS — F4322 Adjustment disorder with anxiety: Secondary | ICD-10-CM | POA: Diagnosis not present

## 2020-06-18 DIAGNOSIS — N2581 Secondary hyperparathyroidism of renal origin: Secondary | ICD-10-CM | POA: Diagnosis not present

## 2020-06-18 DIAGNOSIS — E1129 Type 2 diabetes mellitus with other diabetic kidney complication: Secondary | ICD-10-CM | POA: Diagnosis not present

## 2020-06-18 DIAGNOSIS — Z1159 Encounter for screening for other viral diseases: Secondary | ICD-10-CM | POA: Diagnosis not present

## 2020-06-18 DIAGNOSIS — Z992 Dependence on renal dialysis: Secondary | ICD-10-CM | POA: Diagnosis not present

## 2020-06-20 DIAGNOSIS — N2581 Secondary hyperparathyroidism of renal origin: Secondary | ICD-10-CM | POA: Diagnosis not present

## 2020-06-20 DIAGNOSIS — N186 End stage renal disease: Secondary | ICD-10-CM | POA: Diagnosis not present

## 2020-06-20 DIAGNOSIS — Z992 Dependence on renal dialysis: Secondary | ICD-10-CM | POA: Diagnosis not present

## 2020-06-20 DIAGNOSIS — D509 Iron deficiency anemia, unspecified: Secondary | ICD-10-CM | POA: Diagnosis not present

## 2020-06-20 DIAGNOSIS — D631 Anemia in chronic kidney disease: Secondary | ICD-10-CM | POA: Diagnosis not present

## 2020-06-22 DIAGNOSIS — N2581 Secondary hyperparathyroidism of renal origin: Secondary | ICD-10-CM | POA: Diagnosis not present

## 2020-06-22 DIAGNOSIS — D509 Iron deficiency anemia, unspecified: Secondary | ICD-10-CM | POA: Diagnosis not present

## 2020-06-22 DIAGNOSIS — N186 End stage renal disease: Secondary | ICD-10-CM | POA: Diagnosis not present

## 2020-06-22 DIAGNOSIS — D631 Anemia in chronic kidney disease: Secondary | ICD-10-CM | POA: Diagnosis not present

## 2020-06-22 DIAGNOSIS — Z992 Dependence on renal dialysis: Secondary | ICD-10-CM | POA: Diagnosis not present

## 2020-06-25 DIAGNOSIS — E1129 Type 2 diabetes mellitus with other diabetic kidney complication: Secondary | ICD-10-CM | POA: Diagnosis not present

## 2020-06-25 DIAGNOSIS — I120 Hypertensive chronic kidney disease with stage 5 chronic kidney disease or end stage renal disease: Secondary | ICD-10-CM | POA: Diagnosis not present

## 2020-06-25 DIAGNOSIS — D631 Anemia in chronic kidney disease: Secondary | ICD-10-CM | POA: Diagnosis not present

## 2020-06-25 DIAGNOSIS — Z992 Dependence on renal dialysis: Secondary | ICD-10-CM | POA: Diagnosis not present

## 2020-06-25 DIAGNOSIS — N186 End stage renal disease: Secondary | ICD-10-CM | POA: Diagnosis not present

## 2020-06-25 DIAGNOSIS — N2581 Secondary hyperparathyroidism of renal origin: Secondary | ICD-10-CM | POA: Diagnosis not present

## 2020-06-25 DIAGNOSIS — Z1159 Encounter for screening for other viral diseases: Secondary | ICD-10-CM | POA: Diagnosis not present

## 2020-06-25 DIAGNOSIS — D509 Iron deficiency anemia, unspecified: Secondary | ICD-10-CM | POA: Diagnosis not present

## 2020-06-26 DIAGNOSIS — Z992 Dependence on renal dialysis: Secondary | ICD-10-CM | POA: Diagnosis not present

## 2020-06-26 DIAGNOSIS — I120 Hypertensive chronic kidney disease with stage 5 chronic kidney disease or end stage renal disease: Secondary | ICD-10-CM | POA: Diagnosis not present

## 2020-06-26 DIAGNOSIS — R262 Difficulty in walking, not elsewhere classified: Secondary | ICD-10-CM | POA: Diagnosis not present

## 2020-06-26 DIAGNOSIS — R279 Unspecified lack of coordination: Secondary | ICD-10-CM | POA: Diagnosis not present

## 2020-06-26 DIAGNOSIS — N186 End stage renal disease: Secondary | ICD-10-CM | POA: Diagnosis not present

## 2020-06-27 DIAGNOSIS — I120 Hypertensive chronic kidney disease with stage 5 chronic kidney disease or end stage renal disease: Secondary | ICD-10-CM | POA: Diagnosis not present

## 2020-06-27 DIAGNOSIS — D631 Anemia in chronic kidney disease: Secondary | ICD-10-CM | POA: Diagnosis not present

## 2020-06-27 DIAGNOSIS — Z992 Dependence on renal dialysis: Secondary | ICD-10-CM | POA: Diagnosis not present

## 2020-06-27 DIAGNOSIS — R279 Unspecified lack of coordination: Secondary | ICD-10-CM | POA: Diagnosis not present

## 2020-06-27 DIAGNOSIS — D509 Iron deficiency anemia, unspecified: Secondary | ICD-10-CM | POA: Diagnosis not present

## 2020-06-27 DIAGNOSIS — N186 End stage renal disease: Secondary | ICD-10-CM | POA: Diagnosis not present

## 2020-06-27 DIAGNOSIS — R262 Difficulty in walking, not elsewhere classified: Secondary | ICD-10-CM | POA: Diagnosis not present

## 2020-06-27 DIAGNOSIS — N2581 Secondary hyperparathyroidism of renal origin: Secondary | ICD-10-CM | POA: Diagnosis not present

## 2020-06-28 ENCOUNTER — Non-Acute Institutional Stay (SKILLED_NURSING_FACILITY): Payer: Medicare Other | Admitting: Adult Health

## 2020-06-28 ENCOUNTER — Encounter: Payer: Self-pay | Admitting: Adult Health

## 2020-06-28 DIAGNOSIS — N186 End stage renal disease: Secondary | ICD-10-CM

## 2020-06-28 DIAGNOSIS — F321 Major depressive disorder, single episode, moderate: Secondary | ICD-10-CM

## 2020-06-28 DIAGNOSIS — Z1159 Encounter for screening for other viral diseases: Secondary | ICD-10-CM | POA: Diagnosis not present

## 2020-06-28 DIAGNOSIS — E1122 Type 2 diabetes mellitus with diabetic chronic kidney disease: Secondary | ICD-10-CM | POA: Diagnosis not present

## 2020-06-28 DIAGNOSIS — R279 Unspecified lack of coordination: Secondary | ICD-10-CM | POA: Diagnosis not present

## 2020-06-28 DIAGNOSIS — R262 Difficulty in walking, not elsewhere classified: Secondary | ICD-10-CM | POA: Diagnosis not present

## 2020-06-28 DIAGNOSIS — Z992 Dependence on renal dialysis: Secondary | ICD-10-CM | POA: Diagnosis not present

## 2020-06-28 DIAGNOSIS — E1129 Type 2 diabetes mellitus with other diabetic kidney complication: Secondary | ICD-10-CM | POA: Diagnosis not present

## 2020-06-28 DIAGNOSIS — I120 Hypertensive chronic kidney disease with stage 5 chronic kidney disease or end stage renal disease: Secondary | ICD-10-CM | POA: Diagnosis not present

## 2020-06-28 NOTE — Progress Notes (Signed)
Location:    [facility] Room Number: 112/D Place of Service:  SNF (31)   CODE STATUS: Full Code  Allergies  Allergen Reactions   No Known Allergies     Chief Complaint  Patient presents with   Acute Visit    Care Plan Meeting    HPI:  We have come together for his care plan meeting. Guardian present. BIMS 7/15 mood 0/30. He is independent with his adls. He is continent of bladder and bowel. He is working with therapy for strengthening. There are no reports of falls. Her weight is stable. There are no reports of pain; no changes in appetite; no reports of anxiety or depressive thoughts. He continues to be followed for his chronic illnesses including: Type 2 diabetes mellitus with hypertension and end stage renal disease on dialysis. 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    Past Medical History:  Diagnosis Date   Abnormal CT scan, kidney 10/06/2011   Acute pyelonephritis 10/07/2011   Anemia    normocytic   Anxiety    mental retardation   Bladder wall thickening 10/06/2011   BPH (benign prostatic hypertrophy)    Diabetes mellitus    Dialysis patient ([place])    Tuesday, Thursday and Saturday,    DVT of leg (deep venous thrombosis) ([place]) 12/25/2016   Edema     history of lower extremity edema   GERD (gastroesophageal reflux disease)    Heme positive stool    Hydronephrosis    Hyperkalemia    Hyperlipidemia    Hypernatremia    Hypertension    Hypothyroidism    Impaired speech    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[place])    MR (mental retardation)    Muscle weakness    Obstructive uropathy    Perinephric abscess 10/07/2011   Poor historian poor historian   Protein calorie malnutrition ([place])    Pyelonephritis    Renal failure (ARF), acute on chronic (HCC)    Renal insufficiency    chronic history   Sepsis ([place])    Smoking    Uremia    Urinary retention    UTI (lower urinary  tract infection) 10/06/2011    Past Surgical History:  Procedure Laterality Date   A/V FISTULAGRAM N/A 08/13/2018   Procedure: A/V FISTULAGRAM - Right Upper;  Surgeon: Elam Dutch, MD;  Location: [facility];  Service: Cardiovascular;  Laterality: N/A;   A/V FISTULAGRAM N/A 11/22/2018   Procedure: A/V FISTULAGRAM - Right Upper;  Surgeon: Waynetta Sandy, MD;  Location: [facility];  Service: Cardiovascular;  Laterality: N/A;   AV FISTULA PLACEMENT Left 07/06/2015   Procedure:  INSERTION LEFT ARM ARTERIOVENOUS GORTEX GRAFT;  Surgeon: Angelia Mould, MD;  Location: [place];  Service: Vascular;  Laterality: Left;   AV FISTULA PLACEMENT Right 02/26/2016   Procedure: ARTERIOVENOUS (AV) FISTULA CREATION ;  Surgeon: Angelia Mould, MD;  Location: [place];  Service: Vascular;  Laterality: Right;   AV FISTULA PLACEMENT Right 11/25/2018   Procedure: INSERTION OF ARTERIOVENOUS (AV) ARTEGRAFT RIGHT UPPER ARM;  Surgeon: Waynetta Sandy, MD;  Location: [place];  Service: Vascular;  Laterality: Right;   [place] REMOVAL Left 10/09/2015   Procedure: REMOVAL OF ARTERIOVENOUS GORETEX GRAFT ([place]) Evacuation of Lymphocele, Vein Patch angioplasty of brachial artery.;  Surgeon: Angelia Mould, MD;  Location: [place];  Service: Vascular;  Laterality: Left;   [place] Right 02/26/2016   Procedure: Right BASCILIC  VEIN TRANSPOSITION;  Surgeon: Angelia Mould, MD;  Location: [place];  Service: Vascular;  Laterality: Right;   CIRCUMCISION N/A 01/04/2014   Procedure: CIRCUMCISION ADULT (procedure #1);  Surgeon: Marissa Nestle, MD;  [facility];  Service: Urology;  Laterality: N/A;   COLECTOMY N/A 05/04/2017   Procedure: TOTAL COLECTOMY;  Surgeon: Aviva Signs, MD;  Location: [facility];  Service: General;  Laterality: N/A;   COLONOSCOPY N/A 04/27/2017   Procedure: COLONOSCOPY;  Surgeon: Daneil Dolin, MD;  [facility];  Service:  Endoscopy;  Laterality: N/A;  245   CYSTOSCOPY W/ RETROGRADES Bilateral 06/29/2015   Procedure: CYSTOSCOPY, DILATION OF URETHRAL STRICTURE WITH BILATERAL RETROGRADE PYELOGRAM,SUPRAPUBIC TUBE CHANGE;  Surgeon: Festus Aloe, MD;  Location: [facility];  Service: Urology;  Laterality: Bilateral;   CYSTOSCOPY WITH URETHRAL DILATATION N/A 12/29/2013   Procedure: CYSTOSCOPY WITH URETHRAL DILATATION;  Surgeon: Marissa Nestle, MD;  [facility];  Service: Urology;  Laterality: N/A;   ESOPHAGOGASTRODUODENOSCOPY N/A 04/27/2017   Procedure: ESOPHAGOGASTRODUODENOSCOPY (EGD);  Surgeon: Daneil Dolin, MD;  [facility];  Service: Endoscopy;  Laterality: N/A;   INSERTION OF DIALYSIS CATHETER Right 11/25/2018   Procedure: INSERTION OF DIALYSIS CATHETER RIGHT INTERNAL JUGULAR;  Surgeon: Waynetta Sandy, MD;  Location: [facility];  Service: Vascular;  Laterality: Right;   IR AV DIALY SHUNT INTRO NEEDLE/INTRACATH INITIAL W/PTA/IMG RIGHT Right 09/07/2018   IR REMOVAL TUN CV CATH W/O FL  01/12/2019   IR THROMBECTOMY AV FISTULA W/THROMBOLYSIS/PTA INC/SHUNT/IMG RIGHT Right 04/26/2018   IR US GUIDE VASC ACCESS RIGHT  04/26/2018   IR US GUIDE VASC ACCESS RIGHT  09/07/2018   ORIF FEMUR FRACTURE Right 11/22/2016   Procedure: OPEN REDUCTION INTERNAL FIXATION (ORIF) DISTAL FEMUR FRACTURE;  Surgeon: Rod Can, MD;  Location: [place];  Service: Orthopedics;  Laterality: Right;   PATCH ANGIOPLASTY Right 12/10/2017   Procedure: PATCH ANGIOPLASTY;  Surgeon: Angelia Mould, MD;  Location: [facility];  Service: Vascular;  Laterality: Right;   PERIPHERAL VASCULAR BALLOON ANGIOPLASTY  08/13/2018   Procedure: PERIPHERAL VASCULAR BALLOON ANGIOPLASTY;  Surgeon: Elam Dutch, MD;  Location: [place] [facility];  Service: Cardiovascular;;  right AV fistula    PERIPHERAL VASCULAR BALLOON ANGIOPLASTY  11/22/2018   Procedure: PERIPHERAL VASCULAR BALLOON ANGIOPLASTY;  Surgeon: Waynetta Sandy, MD;   Location: [place] [facility];  Service: Cardiovascular;;  rt AV fistula   PERIPHERAL VASCULAR CATHETERIZATION N/A 10/08/2015   Procedure: A/V Shuntogram;  Surgeon: Angelia Mould, MD;  Location: [place] [facility];  Service: Cardiovascular;  Laterality: N/A;   THROMBECTOMY W/ EMBOLECTOMY Right 12/10/2017   Procedure: THROMBECTOMY REVISION RIGHT ARM  ARTERIOVENOUS FISTULA;  Surgeon: Angelia Mould, MD;  [facility];  Service: Vascular;  Laterality: Right;   TRANSURETHRAL RESECTION OF PROSTATE N/A 01/04/2014   Procedure: TRANSURETHRAL RESECTION OF THE PROSTATE (TURP) (procedure #2);  Surgeon: Marissa Nestle, MD;  [facility];  Service: Urology;  Laterality: N/A;    Social History   Socioeconomic History   Marital status: Single    Spouse name: Not on file   Number of children: Not on file   Years of education: Not on file   Highest education level: Not on file  Occupational History   Occupation: retired   Tobacco Use   Smoking status: Never Smoker   Smokeless tobacco: Never Used  Scientific laboratory technician Use: Never used  Substance and Sexual Activity   Alcohol use: No  Drug use: No   Sexual activity: Not Currently  Other Topics Concern   Not on file  Social History Narrative   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   [facility] resident of [facility]    Social Determinants of Health   Financial Resource Strain:    Difficulty of Paying Living Expenses: Not on file  Food Insecurity:    Worried About Charity fundraiser in the Last Year: Not on file   [employer] of Food in the Last Year: Not on file  Transportation Needs:    Lack of Transportation (Medical): Not on file   Lack of Transportation (Non-Medical): Not on file  Physical Activity: Unknown   Days of Exercise per Week: Not on file   Minutes of Exercise per Session: 0 min  Stress:    Feeling of Stress : Not on file  Social Connections: Unknown    Frequency of Communication with Friends and Family: Not on file   Frequency of Social Gatherings with Friends and Family: Never   Attends Religious Services: Not on Electrical engineer or Organizations: Not on file   Attends Archivist Meetings: Not on file   Marital Status: Not on file  Intimate Partner Violence:    Fear of Current or Ex-Partner: Not on file   Emotionally Abused: Not on file   Physically Abused: Not on file   Sexually Abused: Not on file   Family History  Problem Relation Age of Onset   Cancer Mother    Colon cancer Neg Hx       VITAL SIGNS BP 109/67    Pulse 79    Temp 97.6 F (36.4 C) (Oral)    Resp 18    Ht 5\' 8"  (1.727 m)    Wt 220 lb (99.8 kg)    BMI 33.45 kg/m   Outpatient Encounter Medications as of 06/28/2020  Medication Sig   atorvastatin (LIPITOR) 80 MG tablet Take 80 mg by mouth every evening.   insulin aspart (NOVOLOG FLEXPEN) 100 UNIT/ML FlexPen Inject 10 Units into the skin 3 (three) times daily with meals. Hold insulin on dialysis days if cbg's less than 150 on mon,wed,fri   insulin aspart (NOVOLOG FLEXPEN) 100 UNIT/ML FlexPen insulin pen; 100 unit/mL (3 mL); amt: 10 units; subcutaneous Special Instructions: with meals IF CBG'S MOER THAN 150 Three Times A Day   Insulin Glargine (BASAGLAR KWIKPEN) 100 UNIT/ML SOPN Inject 35 Units into the skin at bedtime.    JANUVIA 25 MG tablet Take 25 mg by mouth daily.    levothyroxine (SYNTHROID, LEVOTHROID) 88 MCG tablet Take 88 mcg by mouth daily before breakfast.   midodrine (PROAMATINE) 10 MG tablet Take 10 mg by mouth. Send med with resident on Mon/Wed/Fri to dialysis. do not give [facility] give to resident to take with him. send with 5mg  to equal 15mg .   midodrine (PROAMATINE) 5 MG tablet Take 5 mg by mouth. Send with resident on dialysis days.  Mon, Wed, Fri.  Take along with 10 mg to equal 15 mg   NON FORMULARY Fluid Restriction - 1200 cc a day 500 cc for  days, Evenings 500 cc, Night 200 cc   NON FORMULARY Diet Type:  NAS, Cons CHO Diet   Nutritional Supplements (NEPRO/CARBSTEADY PO) Take 1 Can by mouth 2 (two) times daily.   omega-3 acid ethyl esters (LOVAZA) 1 g capsule Take 2 g by mouth at bedtime.   omeprazole (PRILOSEC)  40 MG capsule Take 40 mg by mouth daily.    polyethylene glycol (MIRALAX / GLYCOLAX) packet Take 17 g by mouth as needed for mild constipation.    sevelamer carbonate (RENVELA) 800 MG tablet Take 1,600 mg by mouth 3 (three) times daily.    sevelamer carbonate (RENVELA) 800 MG tablet Take 800 mg by mouth daily. With snacks   tamsulosin (FLOMAX) 0.4 MG CAPS capsule Take 0.4 mg by mouth every evening. Give 30 minutes after a meal   torsemide (DEMADEX) 10 MG tablet Take 5 tablets (50 mg total) by mouth daily.   No facility-administered encounter medications on file as of 06/28/2020.     SIGNIFICANT DIAGNOSTIC EXAMS   LABS REVIEWED PREVIOUS;  09-07-19: hgb a1c 7.5 09-16-19: wbc 8.0; hgb 16.4; hct 50.6; mcv 107.2 plt 141; glucose 165; bun 40; creat 10.47; k+ 3.7; na++ 135; ca 9.3; liver normal albumin 4.6; stool guaiac neg  12-09-19: hgb a1c 6.9; hocl 94; ldl 4 trig 339; hdl 22 tsh 3.202 04-09-20: wbc 6.2; hgb 10.7; hct 33.5; mcv 109.1 plt 144;glucose 185; bun 43; creat 10.75; k+ 4.3; na++ 137; ca 8.7 liver normal albumin 3.7 hgb a1c 7.5   NO NEW LABS.   Review of Systems  Constitutional: Negative for malaise/fatigue.  Respiratory: Negative for cough and shortness of breath.   Cardiovascular: Negative for chest pain, palpitations and leg swelling.  Gastrointestinal: Negative for abdominal pain, constipation and heartburn.  Musculoskeletal: Negative for back pain, joint pain and myalgias.  Skin: Negative.   Neurological: Negative for dizziness.  Psychiatric/Behavioral: The patient is not nervous/anxious.      Physical Exam Constitutional:      General: He is not in acute distress.    Appearance: He is  well-developed. He is not diaphoretic.  Neck:     Thyroid: No thyromegaly.  Cardiovascular:     Rate and Rhythm: Normal rate and regular rhythm.     Pulses: Normal pulses.     Heart sounds: Normal heart sounds.  Pulmonary:     Effort: Pulmonary effort is normal. No respiratory distress.     Breath sounds: Normal breath sounds.  Abdominal:     General: Bowel sounds are normal. There is no distension.     Palpations: Abdomen is soft.     Tenderness: There is no abdominal tenderness.  Genitourinary:    Comments: History of turp Musculoskeletal:     Cervical back: Neck supple.     Right lower leg: No edema.     Left lower leg: No edema.     Comments: Is able to move all extremities History of right femur ORIF         Lymphadenopathy:     Cervical: No cervical adenopathy.  Skin:    General: Skin is warm and dry.     Comments: Right upper extremity a/v fistula + thrill   Neurological:     Mental Status: He is alert. Mental status is at baseline.  Psychiatric:        Mood and Affect: Mood normal.      ASSESSMENT/ PLAN:  TODAY  1. Type 2 diabetes mellitus with hypertension and end stage renal disease on dialysis 2. Depression major single episode moderate 3. Dependence on renal dialysis  Will continue current medications Will continue current plan of care Will continue to monitor his status.   MD is aware of resident's narcotic use and is in agreement with current plan of care. We will attempt to wean resident as appropriate.  Ok Edwards NP [facility]  Contact [phone] Monday through Friday 8am- 5pm  After hours call [phone]

## 2020-06-29 DIAGNOSIS — N186 End stage renal disease: Secondary | ICD-10-CM | POA: Diagnosis not present

## 2020-06-29 DIAGNOSIS — N2581 Secondary hyperparathyroidism of renal origin: Secondary | ICD-10-CM | POA: Diagnosis not present

## 2020-06-29 DIAGNOSIS — R279 Unspecified lack of coordination: Secondary | ICD-10-CM | POA: Diagnosis not present

## 2020-06-29 DIAGNOSIS — I120 Hypertensive chronic kidney disease with stage 5 chronic kidney disease or end stage renal disease: Secondary | ICD-10-CM | POA: Diagnosis not present

## 2020-06-29 DIAGNOSIS — Z992 Dependence on renal dialysis: Secondary | ICD-10-CM | POA: Diagnosis not present

## 2020-06-29 DIAGNOSIS — R262 Difficulty in walking, not elsewhere classified: Secondary | ICD-10-CM | POA: Diagnosis not present

## 2020-06-29 DIAGNOSIS — D631 Anemia in chronic kidney disease: Secondary | ICD-10-CM | POA: Diagnosis not present

## 2020-06-29 DIAGNOSIS — D509 Iron deficiency anemia, unspecified: Secondary | ICD-10-CM | POA: Diagnosis not present

## 2020-07-02 DIAGNOSIS — Z992 Dependence on renal dialysis: Secondary | ICD-10-CM | POA: Diagnosis not present

## 2020-07-02 DIAGNOSIS — N2581 Secondary hyperparathyroidism of renal origin: Secondary | ICD-10-CM | POA: Diagnosis not present

## 2020-07-02 DIAGNOSIS — D509 Iron deficiency anemia, unspecified: Secondary | ICD-10-CM | POA: Diagnosis not present

## 2020-07-02 DIAGNOSIS — R262 Difficulty in walking, not elsewhere classified: Secondary | ICD-10-CM | POA: Diagnosis not present

## 2020-07-02 DIAGNOSIS — R279 Unspecified lack of coordination: Secondary | ICD-10-CM | POA: Diagnosis not present

## 2020-07-02 DIAGNOSIS — N186 End stage renal disease: Secondary | ICD-10-CM | POA: Diagnosis not present

## 2020-07-02 DIAGNOSIS — E1129 Type 2 diabetes mellitus with other diabetic kidney complication: Secondary | ICD-10-CM | POA: Diagnosis not present

## 2020-07-02 DIAGNOSIS — Z1159 Encounter for screening for other viral diseases: Secondary | ICD-10-CM | POA: Diagnosis not present

## 2020-07-02 DIAGNOSIS — D631 Anemia in chronic kidney disease: Secondary | ICD-10-CM | POA: Diagnosis not present

## 2020-07-02 DIAGNOSIS — I120 Hypertensive chronic kidney disease with stage 5 chronic kidney disease or end stage renal disease: Secondary | ICD-10-CM | POA: Diagnosis not present

## 2020-07-03 DIAGNOSIS — I120 Hypertensive chronic kidney disease with stage 5 chronic kidney disease or end stage renal disease: Secondary | ICD-10-CM | POA: Diagnosis not present

## 2020-07-03 DIAGNOSIS — Z992 Dependence on renal dialysis: Secondary | ICD-10-CM | POA: Diagnosis not present

## 2020-07-03 DIAGNOSIS — R262 Difficulty in walking, not elsewhere classified: Secondary | ICD-10-CM | POA: Diagnosis not present

## 2020-07-03 DIAGNOSIS — R279 Unspecified lack of coordination: Secondary | ICD-10-CM | POA: Diagnosis not present

## 2020-07-04 ENCOUNTER — Other Ambulatory Visit (HOSPITAL_COMMUNITY)
Admission: RE | Admit: 2020-07-04 | Discharge: 2020-07-04 | Disposition: A | Discharge status: Home / Self Care | Payer: Medicare Other | Source: Skilled Nursing Facility | Attending: Adult Health | Admitting: Adult Health

## 2020-07-04 DIAGNOSIS — N186 End stage renal disease: Secondary | ICD-10-CM | POA: Diagnosis not present

## 2020-07-04 DIAGNOSIS — D509 Iron deficiency anemia, unspecified: Secondary | ICD-10-CM | POA: Diagnosis not present

## 2020-07-04 DIAGNOSIS — D631 Anemia in chronic kidney disease: Secondary | ICD-10-CM | POA: Diagnosis not present

## 2020-07-04 DIAGNOSIS — E1129 Type 2 diabetes mellitus with other diabetic kidney complication: Secondary | ICD-10-CM | POA: Diagnosis not present

## 2020-07-04 DIAGNOSIS — N2581 Secondary hyperparathyroidism of renal origin: Secondary | ICD-10-CM | POA: Diagnosis not present

## 2020-07-04 DIAGNOSIS — Z992 Dependence on renal dialysis: Secondary | ICD-10-CM | POA: Diagnosis not present

## 2020-07-04 LAB — PSA: Prostatic Specific Antigen: 2.79 ng/mL (ref 0.00–4.00)

## 2020-07-05 DIAGNOSIS — E1129 Type 2 diabetes mellitus with other diabetic kidney complication: Secondary | ICD-10-CM | POA: Diagnosis not present

## 2020-07-05 DIAGNOSIS — Z1159 Encounter for screening for other viral diseases: Secondary | ICD-10-CM | POA: Diagnosis not present

## 2020-07-05 DIAGNOSIS — R262 Difficulty in walking, not elsewhere classified: Secondary | ICD-10-CM | POA: Diagnosis not present

## 2020-07-05 DIAGNOSIS — R279 Unspecified lack of coordination: Secondary | ICD-10-CM | POA: Diagnosis not present

## 2020-07-05 DIAGNOSIS — I120 Hypertensive chronic kidney disease with stage 5 chronic kidney disease or end stage renal disease: Secondary | ICD-10-CM | POA: Diagnosis not present

## 2020-07-05 DIAGNOSIS — Z992 Dependence on renal dialysis: Secondary | ICD-10-CM | POA: Diagnosis not present

## 2020-07-06 DIAGNOSIS — D509 Iron deficiency anemia, unspecified: Secondary | ICD-10-CM | POA: Diagnosis not present

## 2020-07-06 DIAGNOSIS — N2581 Secondary hyperparathyroidism of renal origin: Secondary | ICD-10-CM | POA: Diagnosis not present

## 2020-07-06 DIAGNOSIS — Z992 Dependence on renal dialysis: Secondary | ICD-10-CM | POA: Diagnosis not present

## 2020-07-06 DIAGNOSIS — N186 End stage renal disease: Secondary | ICD-10-CM | POA: Diagnosis not present

## 2020-07-06 DIAGNOSIS — D631 Anemia in chronic kidney disease: Secondary | ICD-10-CM | POA: Diagnosis not present

## 2020-07-09 ENCOUNTER — Non-Acute Institutional Stay (SKILLED_NURSING_FACILITY): Payer: Medicare Other | Admitting: Adult Health

## 2020-07-09 ENCOUNTER — Encounter: Payer: Self-pay | Admitting: Adult Health

## 2020-07-09 DIAGNOSIS — E034 Atrophy of thyroid (acquired): Secondary | ICD-10-CM

## 2020-07-09 DIAGNOSIS — D631 Anemia in chronic kidney disease: Secondary | ICD-10-CM | POA: Diagnosis not present

## 2020-07-09 DIAGNOSIS — R262 Difficulty in walking, not elsewhere classified: Secondary | ICD-10-CM | POA: Diagnosis not present

## 2020-07-09 DIAGNOSIS — Z1159 Encounter for screening for other viral diseases: Secondary | ICD-10-CM | POA: Diagnosis not present

## 2020-07-09 DIAGNOSIS — Z992 Dependence on renal dialysis: Secondary | ICD-10-CM

## 2020-07-09 DIAGNOSIS — D509 Iron deficiency anemia, unspecified: Secondary | ICD-10-CM | POA: Diagnosis not present

## 2020-07-09 DIAGNOSIS — N2581 Secondary hyperparathyroidism of renal origin: Secondary | ICD-10-CM | POA: Diagnosis not present

## 2020-07-09 DIAGNOSIS — N186 End stage renal disease: Secondary | ICD-10-CM | POA: Diagnosis not present

## 2020-07-09 DIAGNOSIS — R279 Unspecified lack of coordination: Secondary | ICD-10-CM | POA: Diagnosis not present

## 2020-07-09 DIAGNOSIS — E1129 Type 2 diabetes mellitus with other diabetic kidney complication: Secondary | ICD-10-CM | POA: Diagnosis not present

## 2020-07-09 DIAGNOSIS — F321 Major depressive disorder, single episode, moderate: Secondary | ICD-10-CM

## 2020-07-09 DIAGNOSIS — I120 Hypertensive chronic kidney disease with stage 5 chronic kidney disease or end stage renal disease: Secondary | ICD-10-CM | POA: Diagnosis not present

## 2020-07-09 NOTE — Progress Notes (Addendum)
Location:    [facility] Room Number: 112/D Place of Service:  SNF (31)   CODE STATUS: Full Code  Allergies  Allergen Reactions  . No Known Allergies     Chief Complaint  Patient presents with  . 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 There are no reports of uncontrolled pain. He is ambulating with a walker. There are no reports of constipation or heart burn.    Past Medical History:  Diagnosis Date  . Abnormal CT scan, kidney 10/06/2011  . Acute pyelonephritis 10/07/2011  . Anemia    normocytic  . Anxiety    mental retardation  . Bladder wall thickening 10/06/2011  . BPH (benign prostatic hypertrophy)   . Diabetes mellitus   . Dialysis patient [facility])    Tuesday, Thursday and Saturday,   . DVT of leg (deep venous thrombosis) ([place]) 12/25/2016  . Edema     history of lower extremity edema  . GERD (gastroesophageal reflux disease)   . Heme positive stool   . Hydronephrosis   . Hyperkalemia   . Hyperlipidemia   . Hypernatremia   . Hypertension   . Hypothyroidism   . Impaired speech   . 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 MR (mental retardation)   . Muscle weakness   . Obstructive uropathy   . 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 Poor historian poor historian  . Protein calorie malnutrition ([place])   . Pyelonephritis   . Renal failure (ARF), acute on chronic (HCC)   . Renal insufficiency    chronic history  . Sepsis ([place])   . Smoking   . Uremia   . Urinary retention   . UTI (lower urinary tract infection) 10/06/2011    Past Surgical History:  Procedure  Laterality Date  . A/V FISTULAGRAM N/A 08/13/2018   Procedure: A/V FISTULAGRAM - Right Upper;  Surgeon: Elam Dutch, MD;  Location: [facility];  Service: Cardiovascular;  Laterality: N/A;  . A/V FISTULAGRAM N/A 11/22/2018   Procedure: A/V FISTULAGRAM - Right Upper;  Surgeon: Waynetta Sandy, MD;  Location: [place] [facility];  Service: Cardiovascular;  Laterality: N/A;  . AV FISTULA PLACEMENT Left 07/06/2015   Procedure:  INSERTION LEFT ARM ARTERIOVENOUS GORTEX GRAFT;  Surgeon: Angelia Mould, MD;  Location: [place];  Service: Vascular;  Laterality: Left;  . AV FISTULA PLACEMENT Right 02/26/2016   Procedure: ARTERIOVENOUS (AV) FISTULA CREATION ;  Surgeon: Angelia Mould, MD;  Location: [place];  Service: Vascular;  Laterality: Right;  . AV FISTULA PLACEMENT Right 11/25/2018   Procedure: INSERTION OF ARTERIOVENOUS (AV) ARTEGRAFT RIGHT UPPER ARM;  Surgeon: Waynetta Sandy, MD;  Location: [place];  Service: Vascular;  Laterality: Right;  . [place] REMOVAL Left 10/09/2015   Procedure: REMOVAL OF ARTERIOVENOUS GORETEX GRAFT ([place]) Evacuation of Lymphocele, Vein Patch angioplasty of brachial artery.;  Surgeon: Angelia Mould, MD;  Location: [place];  Service: Vascular;  Laterality: Left;  . BASCILIC VEIN TRANSPOSITION Right 02/26/2016   Procedure: Right BASCILIC VEIN TRANSPOSITION;  Surgeon: Angelia Mould, MD;  [facility];  Service: Vascular;  Laterality: Right;  . CIRCUMCISION N/A 01/04/2014   Procedure: CIRCUMCISION ADULT (procedure #1);  Surgeon: Marissa Nestle, MD;  Location: [facility];  Service: Urology;  Laterality: N/A;  . COLECTOMY N/A 05/04/2017   Procedure: TOTAL COLECTOMY;  Surgeon: Aviva Signs, MD;  Location[facility];  Service: General;  Laterality: N/A;  . COLONOSCOPY N/A 04/27/2017   Procedure: COLONOSCOPY;  Surgeon: Daneil Dolin, MD;  [facility];  Service: Endoscopy;  Laterality: N/A;  245  . CYSTOSCOPY W/ RETROGRADES  Bilateral 06/29/2015   Procedure: CYSTOSCOPY, DILATION OF URETHRAL STRICTURE WITH BILATERAL RETROGRADE PYELOGRAM,SUPRAPUBIC TUBE CHANGE;  Surgeon: Festus Aloe, MD;  Location: [facility];  Service: Urology;  Laterality: Bilateral;  . CYSTOSCOPY WITH URETHRAL DILATATION N/A 12/29/2013   Procedure: CYSTOSCOPY WITH URETHRAL DILATATION;  Surgeon: Marissa Nestle, MD;  [facility];  Service: Urology;  Laterality: N/A;  . ESOPHAGOGASTRODUODENOSCOPY N/A 04/27/2017   Procedure: ESOPHAGOGASTRODUODENOSCOPY (EGD);  Surgeon: Daneil Dolin, MD;  [facility];  Service: Endoscopy;  Laterality: N/A;  . INSERTION OF DIALYSIS CATHETER Right 11/25/2018   Procedure: INSERTION OF DIALYSIS CATHETER RIGHT INTERNAL JUGULAR;  Surgeon: Waynetta Sandy, MD;  Location: [place];  Service: Vascular;  Laterality: Right;  . IR AV DIALY SHUNT INTRO [place] W/PTA/IMG RIGHT Right 09/07/2018  . IR REMOVAL TUN CV CATH W/O FL  01/12/2019  . IR THROMBECTOMY AV FISTULA W/THROMBOLYSIS/PTA INC/SHUNT/IMG RIGHT Right 04/26/2018  . IR US GUIDE VASC ACCESS RIGHT  04/26/2018  . IR US GUIDE VASC ACCESS RIGHT  09/07/2018  . ORIF FEMUR FRACTURE Right 11/22/2016   Procedure: OPEN REDUCTION INTERNAL FIXATION (ORIF) DISTAL FEMUR FRACTURE;  Surgeon: Rod Can, MD;  Location: [place];  Service: Orthopedics;  Laterality: Right;  . PATCH ANGIOPLASTY Right 12/10/2017   Procedure: PATCH ANGIOPLASTY;  Surgeon: Angelia Mould, MD;  Location: [facility] OR;  Service: Vascular;  Laterality: Right;  . PERIPHERAL VASCULAR BALLOON ANGIOPLASTY  08/13/2018   Procedure: PERIPHERAL VASCULAR BALLOON ANGIOPLASTY;  Surgeon: Elam Dutch, MD;  Location: [place] [facility];  Service: Cardiovascular;;  right AV fistula   . PERIPHERAL VASCULAR BALLOON ANGIOPLASTY  11/22/2018   Procedure: PERIPHERAL VASCULAR BALLOON ANGIOPLASTY;  Surgeon: Waynetta Sandy, MD;  Location: [place] [facility];  Service: Cardiovascular;;  rt AV  fistula  . PERIPHERAL VASCULAR CATHETERIZATION N/A 10/08/2015   Procedure: A/V Shuntogram;  Surgeon: Angelia Mould, MD;  Location: [place] [facility];  Service: Cardiovascular;  Laterality: N/A;  . THROMBECTOMY W/ EMBOLECTOMY Right 12/10/2017   Procedure: THROMBECTOMY REVISION RIGHT ARM  ARTERIOVENOUS FISTULA;  Surgeon: Angelia Mould, MD;  Location: [place];  Service: Vascular;  Laterality: Right;  . TRANSURETHRAL RESECTION OF PROSTATE N/A 01/04/2014   Procedure: TRANSURETHRAL RESECTION OF THE PROSTATE (TURP) (procedure #2);  Surgeon: Marissa Nestle, MD;  [facility];  Service: Urology;  Laterality: N/A;    Social History   Socioeconomic History  . Marital status: Single    Spouse name: Not on file  . Number of children: Not on file  . Years of education: Not on file  . Highest education level: Not on file  Occupational History  . Occupation: retired   Tobacco Use  . Smoking status: Never Smoker  . Smokeless tobacco: Never Used  Vaping Use  . Vaping Use: Never used  Substance and Sexual Activity  . Alcohol use: No  . Drug use: No  . Sexual activity: Not  Currently  Other Topics Concern  . 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   Long term resident of [facility]    Social Determinants of Health   Financial Resource Strain:   . Difficulty of Paying Living Expenses: Not on file  Food Insecurity:   . Worried About Charity fundraiser in the Last Year: Not on file  . Ran Out of Food in the Last Year: Not on file  Transportation Needs:   . Lack of Transportation (Medical): Not on file  . Lack of Transportation (Non-Medical): Not on file  Physical Activity: Unknown  . Days of Exercise per Week: Not on file  . Minutes of Exercise per Session: 0 min  Stress:   . Feeling of Stress : Not on file  Social Connections: Unknown  . Frequency of Communication with Friends and Family: Not on file   . Frequency of Social Gatherings with Friends and Family: Never  . Attends Religious Services: Not on file  . Active Member of Clubs or Organizations: Not on file  . Attends Archivist Meetings: Not on file  . Marital Status: Not on file  Intimate Partner Violence:   . Fear of Current or Ex-Partner: Not on file  . Emotionally Abused: Not on file  . Physically Abused: Not on file  . Sexually Abused: Not on file   Family History  Problem Relation Age of Onset  . Cancer Mother   . Colon cancer Neg Hx       VITAL SIGNS BP 100/60   Pulse 82   Temp 98.3 F (36.8 C) (Oral)   Resp 18   Ht 5\' 8"  (1.727 m)   Wt 220 lb (99.8 kg)   BMI 33.45 kg/m   Outpatient Encounter Medications as of 07/09/2020  Medication Sig  . Amino Acids-Protein Hydrolys (FEEDING SUPPLEMENT, PRO-STAT 64,) LIQD Take 30 mLs by mouth 2 (two) times daily between meals.  Marland Kitchen atorvastatin (LIPITOR) 80 MG tablet Take 80 mg by mouth every evening.  . insulin aspart (NOVOLOG FLEXPEN) 100 UNIT/ML FlexPen Inject 10 Units into the skin 3 (three) times daily with meals. Hold insulin on dialysis days if cbg's less than 150 on mon,wed,fri  . insulin aspart (NOVOLOG FLEXPEN) 100 UNIT/ML FlexPen Novolog Flexpen U-100 Insulin (insulin aspart u-100) insulin pen; 100 unit/mL (3 mL); amt: 10 units; subcutaneous Special Instructions: hold insulin on dialysis days if cbg's less than 150 on mon,wed,fri With Meals on Mon, Wed, Fri 09:00 AM, 12:00 PM, 06:00 PM  . Insulin Glargine (BASAGLAR KWIKPEN) 100 UNIT/ML SOPN Inject 40 Units into the skin at bedtime.   Marland Kitchen JANUVIA 25 MG tablet Take 25 mg by mouth daily.   Marland Kitchen levothyroxine (SYNTHROID, LEVOTHROID) 88 MCG tablet Take 88 mcg by mouth daily before breakfast.  . midodrine (PROAMATINE) 10 MG tablet Take 10 mg by mouth. Send med with resident on Mon/Wed/Fri to dialysis. do not give at [facility] give to resident to take with him. send with 5mg  to equal 15mg .  . midodrine  (PROAMATINE) 5 MG tablet Take 5 mg by mouth. Send with resident on dialysis days.  Mon, Wed, Fri.  Take along with 10 mg to equal 15 mg  . NON FORMULARY 1200cc fluid restrictions. Dietary to provide 400cc/24hrs Nursing to give 7-3=140cc/24hrs 3-11=560cc/24hrs 11-7=100cc/24 Special Instructions: Document amount of fluids given each shift. Every Shift Day, Evening, Night  . NON FORMULARY Diet Type:  NAS, Cons CHO  Diet  . omega-3 acid ethyl esters (LOVAZA) 1 g capsule Take 2 g by mouth at bedtime.  Marland Kitchen omeprazole (PRILOSEC) 40 MG capsule Take 40 mg by mouth daily.   . polyethylene glycol (MIRALAX / GLYCOLAX) packet Take 17 g by mouth daily as needed for mild constipation.   . sevelamer carbonate (RENVELA) 800 MG tablet Take 1,600 mg by mouth 3 (three) times daily.   . sevelamer carbonate (RENVELA) 800 MG tablet Take 800 mg by mouth daily. With snacks  . tamsulosin (FLOMAX) 0.4 MG CAPS capsule Take 0.4 mg by mouth every evening. Give 30 minutes after a meal  . torsemide (DEMADEX) 10 MG tablet Take 5 tablets (50 mg total) by mouth daily.  . [DISCONTINUED] Nutritional Supplements (NEPRO/CARBSTEADY PO) Take 1 Can by mouth 2 (two) times daily.   No facility-administered encounter medications on file as of 07/09/2020.     SIGNIFICANT DIAGNOSTIC EXAMS   LABS REVIEWED PREVIOUS;  09-07-19: hgb a1c 7.5 09-16-19: wbc 8.0; hgb 16.4; hct 50.6; mcv 107.2 plt 141; glucose 165; bun 40; creat 10.47; k+ 3.7; na++ 135; ca 9.3; liver normal albumin 4.6; stool guaiac neg  12-09-19: hgb a1c 6.9; chol 94; ldl 4 trig 339; hdl 22 tsh 3.202 04-09-20: wbc 6.2; hgb 10.7; hct 33.5; mcv 109.1 plt 144;glucose 185; bun 43; creat 10.75; k+ 4.3; na++ 137; ca 8.7 liver normal albumin 3.7 hgb a1c 7.5  TODAY  07-04-20: PSA: 2.79    Review of Systems  Constitutional: Negative for malaise/fatigue.  Respiratory: Negative for cough and shortness of breath.   Cardiovascular: Negative for chest pain, palpitations and leg swelling.    Gastrointestinal: Negative for abdominal pain, constipation and heartburn.  Musculoskeletal: Negative for back pain, joint pain and myalgias.  Skin: Negative.   Neurological: Negative for dizziness.  Psychiatric/Behavioral: The patient is not nervous/anxious.    .    Physical Exam Constitutional:      General: He is not in acute distress.    Appearance: He is well-developed. He is not diaphoretic.  Neck:     Thyroid: No thyromegaly.  Cardiovascular:     Rate and Rhythm: Normal rate and regular rhythm.     Pulses: Normal pulses.     Heart sounds: Normal heart sounds.  Pulmonary:     Effort: Pulmonary effort is normal. No respiratory distress.     Breath sounds: Normal breath sounds.  Abdominal:     General: Bowel sounds are normal. There is no distension.     Palpations: Abdomen is soft.     Tenderness: There is no abdominal tenderness.  Genitourinary:    Comments: History of turp Musculoskeletal:     Cervical back: Neck supple.     Right lower leg: No edema.     Left lower leg: No edema.     Comments: Is able to move all extremities History of right femur ORIF          Lymphadenopathy:     Cervical: No cervical adenopathy.  Skin:    General: Skin is warm and dry.     Comments: Right upper extremity a/v fistula + thrill    Neurological:     Mental Status: He is alert. Mental status is at baseline.  Psychiatric:        Mood and Affect: Mood normal.     ASSESSMENT/ PLAN:  TODAY  1. Other specified hypothyroidism: is stable tsh 3.202 will continue synthroid 88 mcg daily   2. Anemia of chronic renal disease on  chronic dialysis is stable hgb 10.7 will monitor   3. Major depression single episode moderate: is presently stable is off medications will monitor     PREVIOUS   4. Bilateral lower extremity edema is stable will continue demadex 50 mg daily   5. Orthostatic hypotension is stable b/p 100/60 will continue midodrine 15 mg with dialysis  6. GERD without  esophagitis: is stable will continue prilosec 40 mg daily   7. Malignant neoplasm of colon: is status post colectomy 05-04-17 will monitor   8. 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 Dyslipidemia associated with type 2 diabetes mellitus is stable trig 339; will continue lipitor 80 mg daily and lovaza 2 gm daily   10. 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 is stable will continue dialysis three times weekly and is followed by nephrology will continue midodrine 15 mg with dialysis 1200 cc fluid restriction renvela 1600 mg with meals and 800 mg with snacks.        MD is aware of resident's narcotic use and is in agreement with current plan of care. We will attempt to wean resident as appropriate.  Ok Edwards NP [facility]  Contact [phone] Monday through Friday 8am- 5pm  After hours call [phone]

## 2020-07-10 DIAGNOSIS — Z992 Dependence on renal dialysis: Secondary | ICD-10-CM | POA: Diagnosis not present

## 2020-07-10 DIAGNOSIS — I120 Hypertensive chronic kidney disease with stage 5 chronic kidney disease or end stage renal disease: Secondary | ICD-10-CM | POA: Diagnosis not present

## 2020-07-10 DIAGNOSIS — R262 Difficulty in walking, not elsewhere classified: Secondary | ICD-10-CM | POA: Diagnosis not present

## 2020-07-10 DIAGNOSIS — R279 Unspecified lack of coordination: Secondary | ICD-10-CM | POA: Diagnosis not present

## 2020-07-11 DIAGNOSIS — R279 Unspecified lack of coordination: Secondary | ICD-10-CM | POA: Diagnosis not present

## 2020-07-11 DIAGNOSIS — D631 Anemia in chronic kidney disease: Secondary | ICD-10-CM | POA: Diagnosis not present

## 2020-07-11 DIAGNOSIS — Z992 Dependence on renal dialysis: Secondary | ICD-10-CM | POA: Diagnosis not present

## 2020-07-11 DIAGNOSIS — I120 Hypertensive chronic kidney disease with stage 5 chronic kidney disease or end stage renal disease: Secondary | ICD-10-CM | POA: Diagnosis not present

## 2020-07-11 DIAGNOSIS — N186 End stage renal disease: Secondary | ICD-10-CM | POA: Diagnosis not present

## 2020-07-11 DIAGNOSIS — N2581 Secondary hyperparathyroidism of renal origin: Secondary | ICD-10-CM | POA: Diagnosis not present

## 2020-07-11 DIAGNOSIS — R262 Difficulty in walking, not elsewhere classified: Secondary | ICD-10-CM | POA: Diagnosis not present

## 2020-07-11 DIAGNOSIS — D509 Iron deficiency anemia, unspecified: Secondary | ICD-10-CM | POA: Diagnosis not present

## 2020-07-12 DIAGNOSIS — E1129 Type 2 diabetes mellitus with other diabetic kidney complication: Secondary | ICD-10-CM | POA: Diagnosis not present

## 2020-07-12 DIAGNOSIS — Z992 Dependence on renal dialysis: Secondary | ICD-10-CM | POA: Diagnosis not present

## 2020-07-12 DIAGNOSIS — R262 Difficulty in walking, not elsewhere classified: Secondary | ICD-10-CM | POA: Diagnosis not present

## 2020-07-12 DIAGNOSIS — Z1159 Encounter for screening for other viral diseases: Secondary | ICD-10-CM | POA: Diagnosis not present

## 2020-07-12 DIAGNOSIS — R279 Unspecified lack of coordination: Secondary | ICD-10-CM | POA: Diagnosis not present

## 2020-07-12 DIAGNOSIS — I120 Hypertensive chronic kidney disease with stage 5 chronic kidney disease or end stage renal disease: Secondary | ICD-10-CM | POA: Diagnosis not present

## 2020-07-13 DIAGNOSIS — D509 Iron deficiency anemia, unspecified: Secondary | ICD-10-CM | POA: Diagnosis not present

## 2020-07-13 DIAGNOSIS — N2581 Secondary hyperparathyroidism of renal origin: Secondary | ICD-10-CM | POA: Diagnosis not present

## 2020-07-13 DIAGNOSIS — R279 Unspecified lack of coordination: Secondary | ICD-10-CM | POA: Diagnosis not present

## 2020-07-13 DIAGNOSIS — R262 Difficulty in walking, not elsewhere classified: Secondary | ICD-10-CM | POA: Diagnosis not present

## 2020-07-13 DIAGNOSIS — N186 End stage renal disease: Secondary | ICD-10-CM | POA: Diagnosis not present

## 2020-07-13 DIAGNOSIS — D631 Anemia in chronic kidney disease: Secondary | ICD-10-CM | POA: Diagnosis not present

## 2020-07-13 DIAGNOSIS — Z992 Dependence on renal dialysis: Secondary | ICD-10-CM | POA: Diagnosis not present

## 2020-07-13 DIAGNOSIS — I120 Hypertensive chronic kidney disease with stage 5 chronic kidney disease or end stage renal disease: Secondary | ICD-10-CM | POA: Diagnosis not present

## 2020-07-16 DIAGNOSIS — Z1159 Encounter for screening for other viral diseases: Secondary | ICD-10-CM | POA: Diagnosis not present

## 2020-07-16 DIAGNOSIS — Z992 Dependence on renal dialysis: Secondary | ICD-10-CM | POA: Diagnosis not present

## 2020-07-16 DIAGNOSIS — I120 Hypertensive chronic kidney disease with stage 5 chronic kidney disease or end stage renal disease: Secondary | ICD-10-CM | POA: Diagnosis not present

## 2020-07-16 DIAGNOSIS — D631 Anemia in chronic kidney disease: Secondary | ICD-10-CM | POA: Diagnosis not present

## 2020-07-16 DIAGNOSIS — N2581 Secondary hyperparathyroidism of renal origin: Secondary | ICD-10-CM | POA: Diagnosis not present

## 2020-07-16 DIAGNOSIS — D509 Iron deficiency anemia, unspecified: Secondary | ICD-10-CM | POA: Diagnosis not present

## 2020-07-16 DIAGNOSIS — E1129 Type 2 diabetes mellitus with other diabetic kidney complication: Secondary | ICD-10-CM | POA: Diagnosis not present

## 2020-07-16 DIAGNOSIS — N186 End stage renal disease: Secondary | ICD-10-CM | POA: Diagnosis not present

## 2020-07-17 ENCOUNTER — Encounter: Payer: Self-pay | Admitting: Adult Health

## 2020-07-17 ENCOUNTER — Non-Acute Institutional Stay (SKILLED_NURSING_FACILITY): Payer: Medicare Other | Admitting: Adult Health

## 2020-07-17 DIAGNOSIS — I120 Hypertensive chronic kidney disease with stage 5 chronic kidney disease or end stage renal disease: Secondary | ICD-10-CM

## 2020-07-17 DIAGNOSIS — R279 Unspecified lack of coordination: Secondary | ICD-10-CM | POA: Diagnosis not present

## 2020-07-17 DIAGNOSIS — E1122 Type 2 diabetes mellitus with diabetic chronic kidney disease: Secondary | ICD-10-CM

## 2020-07-17 DIAGNOSIS — R262 Difficulty in walking, not elsewhere classified: Secondary | ICD-10-CM | POA: Diagnosis not present

## 2020-07-17 DIAGNOSIS — Z992 Dependence on renal dialysis: Secondary | ICD-10-CM | POA: Diagnosis not present

## 2020-07-17 DIAGNOSIS — N186 End stage renal disease: Secondary | ICD-10-CM

## 2020-07-17 NOTE — Progress Notes (Signed)
Location:    [facility] Room Number: 112/D Place of Service:  SNF (31)   CODE STATUS: Full Code  Allergies  Allergen Reactions   No Known Allergies     Chief Complaint  Patient presents with   Acute Visit    Diabetes    HPI:  He is presently taking basaglar 40 units nightly; novolog 10 units three times daily januvia 25 mg daily. His cbg's readings remain elevated. He denies any excessive thirst or hunger. There are no reports of anxiety or agitation.   Past Medical History:  Diagnosis Date   Abnormal CT scan, kidney 10/06/2011   Acute pyelonephritis 10/07/2011   Anemia    normocytic   Anxiety    mental retardation   Bladder wall thickening 10/06/2011   BPH (benign prostatic hypertrophy)    Diabetes mellitus    Dialysis patient ([place])    Tuesday, Thursday and Saturday,    DVT of leg (deep venous thrombosis) ([place]) 12/25/2016   Edema     history of lower extremity edema   GERD (gastroesophageal reflux disease)    Heme positive stool    Hydronephrosis    Hyperkalemia    Hyperlipidemia    Hypernatremia    Hypertension    Hypothyroidism    Impaired speech    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)    MR (mental retardation)    Muscle weakness    Obstructive uropathy    Perinephric abscess 10/07/2011   Poor historian poor historian   Protein calorie malnutrition ([place])    Pyelonephritis    Renal failure (ARF), acute on chronic (HCC)    Renal insufficiency    chronic history   Sepsis ([place])    Smoking    Uremia    Urinary retention    UTI (lower urinary tract infection) 10/06/2011    Past Surgical History:  Procedure Laterality Date   A/V FISTULAGRAM N/A 08/13/2018   Procedure: A/V FISTULAGRAM - Right Upper;  Surgeon: Elam Dutch, MD;  Location: [facility];  Service: Cardiovascular;  Laterality: N/A;   A/V FISTULAGRAM N/A 11/22/2018   Procedure: A/V FISTULAGRAM - Right Upper;   Surgeon: Waynetta Sandy, MD;  Location: [facility];  Service: Cardiovascular;  Laterality: N/A;   AV FISTULA PLACEMENT Left 07/06/2015   Procedure:  INSERTION LEFT ARM ARTERIOVENOUS GORTEX GRAFT;  Surgeon: Angelia Mould, MD;  Location: [facility];  Service: Vascular;  Laterality: Left;   AV FISTULA PLACEMENT Right 02/26/2016   Procedure: ARTERIOVENOUS (AV) FISTULA CREATION ;  Surgeon: Angelia Mould, MD;  Location: [facility];  Service: Vascular;  Laterality: Right;   AV FISTULA PLACEMENT Right 11/25/2018   Procedure: INSERTION OF ARTERIOVENOUS (AV) ARTEGRAFT RIGHT UPPER ARM;  Surgeon: Waynetta Sandy, MD;  Location: [facility];  Service: Vascular;  Laterality: Right;   [place] REMOVAL Left 10/09/2015   Procedure: REMOVAL OF ARTERIOVENOUS GORETEX GRAFT ([place]) Evacuation of Lymphocele, Vein Patch angioplasty of brachial artery.;  Surgeon: Angelia Mould, MD;  Location: [facility];  Service: Vascular;  Laterality: Left;   [place] Right 02/26/2016   Procedure: Right [place];  Surgeon: Angelia Mould, MD;  Location: [facility];  Service: Vascular;  Laterality: Right;   CIRCUMCISION N/A 01/04/2014   Procedure: CIRCUMCISION ADULT (procedure #1);  Surgeon: Marissa Nestle, MD;  Location: [facility];  Service: Urology;  Laterality: N/A;   COLECTOMY N/A 05/04/2017   Procedure: TOTAL COLECTOMY;  Surgeon: Arnoldo Morale,  Elta Guadeloupe, MD;  Location: [facility];  Service: General;  Laterality: N/A;   COLONOSCOPY N/A 04/27/2017   Procedure: COLONOSCOPY;  Surgeon: Daneil Dolin, MD;  [facility];  Service: Endoscopy;  Laterality: N/A;  245   CYSTOSCOPY W/ RETROGRADES Bilateral 06/29/2015   Procedure: CYSTOSCOPY, DILATION OF URETHRAL STRICTURE WITH BILATERAL RETROGRADE PYELOGRAM,SUPRAPUBIC TUBE CHANGE;  Surgeon: Festus Aloe, MD;  Location: [facility];  Service: Urology;  Laterality: Bilateral;   CYSTOSCOPY WITH URETHRAL DILATATION N/A 12/29/2013    Procedure: CYSTOSCOPY WITH URETHRAL DILATATION;  Surgeon: Marissa Nestle, MD;  [facility];  Service: Urology;  Laterality: N/A;   ESOPHAGOGASTRODUODENOSCOPY N/A 04/27/2017   Procedure: ESOPHAGOGASTRODUODENOSCOPY (EGD);  Surgeon: Daneil Dolin, MD;  [facility];  Service: Endoscopy;  Laterality: N/A;   INSERTION OF DIALYSIS CATHETER Right 11/25/2018   Procedure: INSERTION OF DIALYSIS CATHETER RIGHT INTERNAL JUGULAR;  Surgeon: Waynetta Sandy, MD;  [facility];  Service: Vascular;  Laterality: Right;   IR AV DIALY SHUNT INTRO NEEDLE/INTRACATH INITIAL W/PTA/IMG RIGHT Right 09/07/2018   IR REMOVAL TUN CV CATH W/O FL  01/12/2019   IR THROMBECTOMY AV FISTULA W/THROMBOLYSIS/PTA INC/SHUNT/IMG RIGHT Right 04/26/2018   IR US GUIDE VASC ACCESS RIGHT  04/26/2018   IR US GUIDE VASC ACCESS RIGHT  09/07/2018   ORIF FEMUR FRACTURE Right 11/22/2016   Procedure: OPEN REDUCTION INTERNAL FIXATION (ORIF) DISTAL FEMUR FRACTURE;  Surgeon: Rod Can, MD;  Location: [facility];  Service: Orthopedics;  Laterality: Right;   PATCH ANGIOPLASTY Right 12/10/2017   Procedure: PATCH ANGIOPLASTY;  Surgeon: Angelia Mould, MD;  Location: [place];  Service: Vascular;  Laterality: Right;   PERIPHERAL VASCULAR BALLOON ANGIOPLASTY  08/13/2018   Procedure: PERIPHERAL VASCULAR BALLOON ANGIOPLASTY;  Surgeon: Elam Dutch, MD;  Location: [facility];  Service: Cardiovascular;;  right AV fistula    PERIPHERAL VASCULAR BALLOON ANGIOPLASTY  11/22/2018   Procedure: PERIPHERAL VASCULAR BALLOON ANGIOPLASTY;  Surgeon: Waynetta Sandy, MD;  Location: [facility];  Service: Cardiovascular;;  rt AV fistula   PERIPHERAL VASCULAR CATHETERIZATION N/A 10/08/2015   Procedure: A/V Shuntogram;  Surgeon: Angelia Mould, MD;  Location: [facility];  Service: Cardiovascular;  Laterality: N/A;   THROMBECTOMY W/ EMBOLECTOMY Right 12/10/2017   Procedure: THROMBECTOMY  REVISION RIGHT ARM  ARTERIOVENOUS FISTULA;  Surgeon: Angelia Mould, MD;  Location: [facility];  Service: Vascular;  Laterality: Right;   TRANSURETHRAL RESECTION OF PROSTATE N/A 01/04/2014   Procedure: TRANSURETHRAL RESECTION OF THE PROSTATE (TURP) (procedure #2);  Surgeon: Marissa Nestle, MD;  [facility];  Service: Urology;  Laterality: N/A;    Social History   Socioeconomic History   Marital status: Single    Spouse name: Not on file   Number of children: Not on file   Years of education: Not on file   Highest education level: Not on file  Occupational History   Occupation: retired   Tobacco Use   Smoking status: Never Smoker   Smokeless tobacco: Never Used  Scientific laboratory technician Use: Never used  Substance and Sexual Activity   Alcohol use: No   Drug use: No   Sexual activity: Not Currently  Other Topics Concern   Not on file  Social History Narrative   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   Long term resident of [facility]    Social Determinants of Health   Financial Resource Strain:    Difficulty of Paying Living  Expenses: Not on file  Food Insecurity:    Worried About Charity fundraiser in the Last Year: Not on file   [employer] of Food in the Last Year: Not on file  Transportation Needs:    Lack of Transportation (Medical): Not on file   Lack of Transportation (Non-Medical): Not on file  Physical Activity: Unknown   Days of Exercise per Week: Not on file   Minutes of Exercise per Session: 0 min  Stress:    Feeling of Stress : Not on file  Social Connections: Unknown   Frequency of Communication with Friends and Family: Not on file   Frequency of Social Gatherings with Friends and Family: Never   Attends Religious Services: Not on Electrical engineer or Organizations: Not on file   Attends Archivist Meetings: Not on file   Marital Status: Not on file  Intimate Partner  Violence:    Fear of Current or Ex-Partner: Not on file   Emotionally Abused: Not on file   Physically Abused: Not on file   Sexually Abused: Not on file   Family History  Problem Relation Age of Onset   Cancer Mother    Colon cancer Neg Hx       VITAL SIGNS BP 99/61    Pulse 97    Temp (!) 97.2 F (36.2 C)    Resp 18    Ht 5\' 8"  (1.727 m)    Wt 220 lb (99.8 kg)    BMI 33.45 kg/m   Outpatient Encounter Medications as of 07/17/2020  Medication Sig   Amino Acids-Protein Hydrolys (FEEDING SUPPLEMENT, PRO-STAT 64,) LIQD Take 30 mLs by mouth 2 (two) times daily between meals.   atorvastatin (LIPITOR) 80 MG tablet Take 80 mg by mouth every evening.   insulin aspart (NOVOLOG FLEXPEN) 100 UNIT/ML FlexPen Inject 15 Units into the skin 3 (three) times daily with meals. Hold insulin on dialysis days if cbg's less than 150 on mon,wed,fri   insulin aspart (NOVOLOG FLEXPEN) 100 UNIT/ML FlexPen Novolog Flexpen U-100 Insulin (insulin aspart u-100) insulin pen; 100 unit/mL (3 mL); amt: 15 units; subcutaneous Special Instructions: hold insulin on dialysis days if cbg's less than 150 on mon,wed,fri With Meals on Mon, Wed, Fri 09:00 AM, 12:00 PM, 06:00 PM   Insulin Glargine (BASAGLAR KWIKPEN) 100 UNIT/ML SOPN Inject 45 Units into the skin at bedtime.    JANUVIA 25 MG tablet Take 25 mg by mouth daily.    levothyroxine (SYNTHROID, LEVOTHROID) 88 MCG tablet Take 88 mcg by mouth daily before breakfast.   midodrine (PROAMATINE) 10 MG tablet Take 10 mg by mouth. Send med with resident on Mon/Wed/Fri to dialysis. do not give at [facility] give to resident to take with him. send with 5mg  to equal 15mg .   midodrine (PROAMATINE) 5 MG tablet Take 5 mg by mouth. Send with resident on dialysis days.  Mon, Wed, Fri.  Take along with 10 mg to equal 15 mg   NON FORMULARY 1200cc fluid restrictions. Dietary to provide 400cc/24hrs Nursing to give 7-3=140cc/24hrs 3-11=560cc/24hrs 11-7=100cc/24 Special  Instructions: Document amount of fluids given each shift. Every Shift Day, Evening, Night   NON FORMULARY Diet Type:  NAS, Cons CHO Diet   omega-3 acid ethyl esters (LOVAZA) 1 g capsule Take 2 g by mouth at bedtime.   omeprazole (PRILOSEC) 40 MG capsule Take 40 mg by mouth daily.    polyethylene glycol (MIRALAX / GLYCOLAX) packet Take 17 g by  mouth daily as needed for mild constipation.    sevelamer carbonate (RENVELA) 800 MG tablet Take 1,800 mg by mouth 3 (three) times daily.    sevelamer carbonate (RENVELA) 800 MG tablet Take 800 mg by mouth daily. With snacks   tamsulosin (FLOMAX) 0.4 MG CAPS capsule Take 0.4 mg by mouth every evening. Give 30 minutes after a meal   torsemide (DEMADEX) 10 MG tablet Take 5 tablets (50 mg total) by mouth daily.   No facility-administered encounter medications on file as of 07/17/2020.     SIGNIFICANT DIAGNOSTIC EXAMS  LABS REVIEWED PREVIOUS;  09-07-19: hgb a1c 7.5 09-16-19: wbc 8.0; hgb 16.4; hct 50.6; mcv 107.2 plt 141; glucose 165; bun 40; creat 10.47; k+ 3.7; na++ 135; ca 9.3; liver normal albumin 4.6; stool guaiac neg  12-09-19: hgb a1c 6.9; chol 94; ldl 4 trig 339; hdl 22 tsh 3.202 04-09-20: wbc 6.2; hgb 10.7; hct 33.5; mcv 109.1 plt 144;glucose 185; bun 43; creat 10.75; k+ 4.3; na++ 137; ca 8.7 liver normal albumin 3.7 hgb a1c 7.5 07-04-20: PSA: 2.79   NO NEW LABS.    Review of Systems  Constitutional: Negative for malaise/fatigue.  Respiratory: Negative for cough and shortness of breath.   Cardiovascular: Negative for chest pain, palpitations and leg swelling.  Gastrointestinal: Negative for abdominal pain, constipation and heartburn.  Musculoskeletal: Negative for back pain, joint pain and myalgias.  Skin: Negative.   Neurological: Negative for dizziness.  Psychiatric/Behavioral: The patient is not nervous/anxious.     Physical Exam Constitutional:      General: He is not in acute distress.    Appearance: He is well-developed.  He is not diaphoretic.  Neck:     Thyroid: No thyromegaly.  Cardiovascular:     Rate and Rhythm: Normal rate and regular rhythm.     Pulses: Normal pulses.     Heart sounds: Normal heart sounds.  Pulmonary:     Effort: Pulmonary effort is normal. No respiratory distress.     Breath sounds: Normal breath sounds.  Abdominal:     General: Bowel sounds are normal. There is no distension.     Palpations: Abdomen is soft.     Tenderness: There is no abdominal tenderness.  Genitourinary:    Comments: History of turp Musculoskeletal:     Cervical back: Neck supple.     Right lower leg: No edema.     Left lower leg: No edema.     Comments: Is able to move all extremities History of right femur ORIF           Lymphadenopathy:     Cervical: No cervical adenopathy.  Skin:    General: Skin is warm and dry.     Comments: Right upper extremity a/v fistula + thrill     Neurological:     Mental Status: He is alert. Mental status is at baseline.  Psychiatric:        Mood and Affect: Mood normal.      ASSESSMENT/ PLAN:  TODAY  1. 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 cbg's remain elevated: will continue januvia 25 mg daily.  Will increase to: basaglar 45 units nightly and novolog 15 units with meals.   Will check hgb a1c.   MD is aware of resident's narcotic use and is in agreement with current plan of care. We will attempt to wean resident as appropriate.  Ok Edwards NP [facility]  Contact [phone] Monday through Friday  8am- 5pm  After hours call [phone]

## 2020-07-18 DIAGNOSIS — D509 Iron deficiency anemia, unspecified: Secondary | ICD-10-CM | POA: Diagnosis not present

## 2020-07-18 DIAGNOSIS — N2581 Secondary hyperparathyroidism of renal origin: Secondary | ICD-10-CM | POA: Diagnosis not present

## 2020-07-18 DIAGNOSIS — N186 End stage renal disease: Secondary | ICD-10-CM | POA: Diagnosis not present

## 2020-07-18 DIAGNOSIS — I120 Hypertensive chronic kidney disease with stage 5 chronic kidney disease or end stage renal disease: Secondary | ICD-10-CM | POA: Diagnosis not present

## 2020-07-18 DIAGNOSIS — Z992 Dependence on renal dialysis: Secondary | ICD-10-CM | POA: Diagnosis not present

## 2020-07-18 DIAGNOSIS — R279 Unspecified lack of coordination: Secondary | ICD-10-CM | POA: Diagnosis not present

## 2020-07-18 DIAGNOSIS — R262 Difficulty in walking, not elsewhere classified: Secondary | ICD-10-CM | POA: Diagnosis not present

## 2020-07-18 DIAGNOSIS — D631 Anemia in chronic kidney disease: Secondary | ICD-10-CM | POA: Diagnosis not present

## 2020-07-19 ENCOUNTER — Other Ambulatory Visit (HOSPITAL_COMMUNITY)
Admission: RE | Admit: 2020-07-19 | Discharge: 2020-07-19 | Disposition: A | Discharge status: Home / Self Care | Payer: Medicare Other | Source: Skilled Nursing Facility | Attending: Adult Health | Admitting: Adult Health

## 2020-07-19 DIAGNOSIS — E1129 Type 2 diabetes mellitus with other diabetic kidney complication: Secondary | ICD-10-CM | POA: Insufficient documentation

## 2020-07-19 DIAGNOSIS — Z992 Dependence on renal dialysis: Secondary | ICD-10-CM | POA: Diagnosis not present

## 2020-07-19 DIAGNOSIS — I120 Hypertensive chronic kidney disease with stage 5 chronic kidney disease or end stage renal disease: Secondary | ICD-10-CM | POA: Diagnosis not present

## 2020-07-19 DIAGNOSIS — R262 Difficulty in walking, not elsewhere classified: Secondary | ICD-10-CM | POA: Diagnosis not present

## 2020-07-19 DIAGNOSIS — R279 Unspecified lack of coordination: Secondary | ICD-10-CM | POA: Diagnosis not present

## 2020-07-19 LAB — HEMOGLOBIN A1C
Hgb A1c MFr Bld: 7.7 % — ABNORMAL HIGH (ref 4.8–5.6)
Mean Plasma Glucose: 174.29 mg/dL

## 2020-07-20 DIAGNOSIS — R262 Difficulty in walking, not elsewhere classified: Secondary | ICD-10-CM | POA: Diagnosis not present

## 2020-07-20 DIAGNOSIS — I120 Hypertensive chronic kidney disease with stage 5 chronic kidney disease or end stage renal disease: Secondary | ICD-10-CM | POA: Diagnosis not present

## 2020-07-20 DIAGNOSIS — N2581 Secondary hyperparathyroidism of renal origin: Secondary | ICD-10-CM | POA: Diagnosis not present

## 2020-07-20 DIAGNOSIS — D631 Anemia in chronic kidney disease: Secondary | ICD-10-CM | POA: Diagnosis not present

## 2020-07-20 DIAGNOSIS — D509 Iron deficiency anemia, unspecified: Secondary | ICD-10-CM | POA: Diagnosis not present

## 2020-07-20 DIAGNOSIS — R279 Unspecified lack of coordination: Secondary | ICD-10-CM | POA: Diagnosis not present

## 2020-07-20 DIAGNOSIS — Z992 Dependence on renal dialysis: Secondary | ICD-10-CM | POA: Diagnosis not present

## 2020-07-20 DIAGNOSIS — N186 End stage renal disease: Secondary | ICD-10-CM | POA: Diagnosis not present

## 2020-07-23 DIAGNOSIS — R279 Unspecified lack of coordination: Secondary | ICD-10-CM | POA: Diagnosis not present

## 2020-07-23 DIAGNOSIS — N186 End stage renal disease: Secondary | ICD-10-CM | POA: Diagnosis not present

## 2020-07-23 DIAGNOSIS — D509 Iron deficiency anemia, unspecified: Secondary | ICD-10-CM | POA: Diagnosis not present

## 2020-07-23 DIAGNOSIS — D631 Anemia in chronic kidney disease: Secondary | ICD-10-CM | POA: Diagnosis not present

## 2020-07-23 DIAGNOSIS — R262 Difficulty in walking, not elsewhere classified: Secondary | ICD-10-CM | POA: Diagnosis not present

## 2020-07-23 DIAGNOSIS — N2581 Secondary hyperparathyroidism of renal origin: Secondary | ICD-10-CM | POA: Diagnosis not present

## 2020-07-23 DIAGNOSIS — I120 Hypertensive chronic kidney disease with stage 5 chronic kidney disease or end stage renal disease: Secondary | ICD-10-CM | POA: Diagnosis not present

## 2020-07-23 DIAGNOSIS — Z992 Dependence on renal dialysis: Secondary | ICD-10-CM | POA: Diagnosis not present

## 2020-07-24 DIAGNOSIS — R262 Difficulty in walking, not elsewhere classified: Secondary | ICD-10-CM | POA: Diagnosis not present

## 2020-07-24 DIAGNOSIS — I120 Hypertensive chronic kidney disease with stage 5 chronic kidney disease or end stage renal disease: Secondary | ICD-10-CM | POA: Diagnosis not present

## 2020-07-24 DIAGNOSIS — R279 Unspecified lack of coordination: Secondary | ICD-10-CM | POA: Diagnosis not present

## 2020-07-24 DIAGNOSIS — Z992 Dependence on renal dialysis: Secondary | ICD-10-CM | POA: Diagnosis not present

## 2020-07-25 DIAGNOSIS — Z992 Dependence on renal dialysis: Secondary | ICD-10-CM | POA: Diagnosis not present

## 2020-07-25 DIAGNOSIS — D631 Anemia in chronic kidney disease: Secondary | ICD-10-CM | POA: Diagnosis not present

## 2020-07-25 DIAGNOSIS — N2581 Secondary hyperparathyroidism of renal origin: Secondary | ICD-10-CM | POA: Diagnosis not present

## 2020-07-25 DIAGNOSIS — N186 End stage renal disease: Secondary | ICD-10-CM | POA: Diagnosis not present

## 2020-07-25 DIAGNOSIS — D509 Iron deficiency anemia, unspecified: Secondary | ICD-10-CM | POA: Diagnosis not present

## 2020-07-26 DIAGNOSIS — Z992 Dependence on renal dialysis: Secondary | ICD-10-CM | POA: Diagnosis not present

## 2020-07-26 DIAGNOSIS — N186 End stage renal disease: Secondary | ICD-10-CM | POA: Diagnosis not present

## 2020-07-27 DIAGNOSIS — Z992 Dependence on renal dialysis: Secondary | ICD-10-CM | POA: Diagnosis not present

## 2020-07-27 DIAGNOSIS — N186 End stage renal disease: Secondary | ICD-10-CM | POA: Diagnosis not present

## 2020-07-27 DIAGNOSIS — N2581 Secondary hyperparathyroidism of renal origin: Secondary | ICD-10-CM | POA: Diagnosis not present

## 2020-07-27 DIAGNOSIS — D509 Iron deficiency anemia, unspecified: Secondary | ICD-10-CM | POA: Diagnosis not present

## 2020-07-27 DIAGNOSIS — D631 Anemia in chronic kidney disease: Secondary | ICD-10-CM | POA: Diagnosis not present

## 2020-07-30 DIAGNOSIS — Z992 Dependence on renal dialysis: Secondary | ICD-10-CM | POA: Diagnosis not present

## 2020-07-30 DIAGNOSIS — N186 End stage renal disease: Secondary | ICD-10-CM | POA: Diagnosis not present

## 2020-07-30 DIAGNOSIS — D631 Anemia in chronic kidney disease: Secondary | ICD-10-CM | POA: Diagnosis not present

## 2020-07-30 DIAGNOSIS — D509 Iron deficiency anemia, unspecified: Secondary | ICD-10-CM | POA: Diagnosis not present

## 2020-07-30 DIAGNOSIS — N2581 Secondary hyperparathyroidism of renal origin: Secondary | ICD-10-CM | POA: Diagnosis not present

## 2020-08-01 DIAGNOSIS — Z992 Dependence on renal dialysis: Secondary | ICD-10-CM | POA: Diagnosis not present

## 2020-08-01 DIAGNOSIS — D631 Anemia in chronic kidney disease: Secondary | ICD-10-CM | POA: Diagnosis not present

## 2020-08-01 DIAGNOSIS — N2581 Secondary hyperparathyroidism of renal origin: Secondary | ICD-10-CM | POA: Diagnosis not present

## 2020-08-01 DIAGNOSIS — N186 End stage renal disease: Secondary | ICD-10-CM | POA: Diagnosis not present

## 2020-08-01 DIAGNOSIS — D509 Iron deficiency anemia, unspecified: Secondary | ICD-10-CM | POA: Diagnosis not present

## 2020-08-03 DIAGNOSIS — N186 End stage renal disease: Secondary | ICD-10-CM | POA: Diagnosis not present

## 2020-08-03 DIAGNOSIS — D631 Anemia in chronic kidney disease: Secondary | ICD-10-CM | POA: Diagnosis not present

## 2020-08-03 DIAGNOSIS — Z992 Dependence on renal dialysis: Secondary | ICD-10-CM | POA: Diagnosis not present

## 2020-08-03 DIAGNOSIS — N2581 Secondary hyperparathyroidism of renal origin: Secondary | ICD-10-CM | POA: Diagnosis not present

## 2020-08-03 DIAGNOSIS — D509 Iron deficiency anemia, unspecified: Secondary | ICD-10-CM | POA: Diagnosis not present

## 2020-08-06 DIAGNOSIS — D631 Anemia in chronic kidney disease: Secondary | ICD-10-CM | POA: Diagnosis not present

## 2020-08-06 DIAGNOSIS — Z992 Dependence on renal dialysis: Secondary | ICD-10-CM | POA: Diagnosis not present

## 2020-08-06 DIAGNOSIS — D509 Iron deficiency anemia, unspecified: Secondary | ICD-10-CM | POA: Diagnosis not present

## 2020-08-06 DIAGNOSIS — N186 End stage renal disease: Secondary | ICD-10-CM | POA: Diagnosis not present

## 2020-08-06 DIAGNOSIS — N2581 Secondary hyperparathyroidism of renal origin: Secondary | ICD-10-CM | POA: Diagnosis not present

## 2020-08-08 DIAGNOSIS — Z992 Dependence on renal dialysis: Secondary | ICD-10-CM | POA: Diagnosis not present

## 2020-08-08 DIAGNOSIS — D509 Iron deficiency anemia, unspecified: Secondary | ICD-10-CM | POA: Diagnosis not present

## 2020-08-08 DIAGNOSIS — N2581 Secondary hyperparathyroidism of renal origin: Secondary | ICD-10-CM | POA: Diagnosis not present

## 2020-08-08 DIAGNOSIS — D631 Anemia in chronic kidney disease: Secondary | ICD-10-CM | POA: Diagnosis not present

## 2020-08-08 DIAGNOSIS — N186 End stage renal disease: Secondary | ICD-10-CM | POA: Diagnosis not present

## 2020-08-10 DIAGNOSIS — E119 Type 2 diabetes mellitus without complications: Secondary | ICD-10-CM | POA: Diagnosis not present

## 2020-08-10 DIAGNOSIS — D509 Iron deficiency anemia, unspecified: Secondary | ICD-10-CM | POA: Diagnosis not present

## 2020-08-10 DIAGNOSIS — Z992 Dependence on renal dialysis: Secondary | ICD-10-CM | POA: Diagnosis not present

## 2020-08-10 DIAGNOSIS — N2581 Secondary hyperparathyroidism of renal origin: Secondary | ICD-10-CM | POA: Diagnosis not present

## 2020-08-10 DIAGNOSIS — D631 Anemia in chronic kidney disease: Secondary | ICD-10-CM | POA: Diagnosis not present

## 2020-08-10 DIAGNOSIS — N186 End stage renal disease: Secondary | ICD-10-CM | POA: Diagnosis not present

## 2020-08-13 ENCOUNTER — Non-Acute Institutional Stay (SKILLED_NURSING_FACILITY): Payer: Medicare Other | Admitting: Adult Health

## 2020-08-13 ENCOUNTER — Encounter: Payer: Self-pay | Admitting: Adult Health

## 2020-08-13 DIAGNOSIS — D509 Iron deficiency anemia, unspecified: Secondary | ICD-10-CM | POA: Diagnosis not present

## 2020-08-13 DIAGNOSIS — E1122 Type 2 diabetes mellitus with diabetic chronic kidney disease: Secondary | ICD-10-CM | POA: Diagnosis not present

## 2020-08-13 DIAGNOSIS — D631 Anemia in chronic kidney disease: Secondary | ICD-10-CM | POA: Diagnosis not present

## 2020-08-13 DIAGNOSIS — R600 Localized edema: Secondary | ICD-10-CM

## 2020-08-13 DIAGNOSIS — Z992 Dependence on renal dialysis: Secondary | ICD-10-CM | POA: Diagnosis not present

## 2020-08-13 DIAGNOSIS — I120 Hypertensive chronic kidney disease with stage 5 chronic kidney disease or end stage renal disease: Secondary | ICD-10-CM

## 2020-08-13 DIAGNOSIS — N186 End stage renal disease: Secondary | ICD-10-CM | POA: Diagnosis not present

## 2020-08-13 DIAGNOSIS — N2581 Secondary hyperparathyroidism of renal origin: Secondary | ICD-10-CM | POA: Diagnosis not present

## 2020-08-13 DIAGNOSIS — I951 Orthostatic hypotension: Secondary | ICD-10-CM

## 2020-08-13 NOTE — Progress Notes (Signed)
Location:    [facility] Room Number: 112/D Place of Service:  SNF (31)   CODE STATUS: Full Code  Allergies  Allergen Reactions  . No Known Allergies     Chief Complaint  Patient presents with  . 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 There are no reports of uncontrolled pain; no changes in appetite; no insomnia or depressive thoughts.   Past Medical History:  Diagnosis Date  . Abnormal CT scan, kidney 10/06/2011  . Acute pyelonephritis 10/07/2011  . Anemia    normocytic  . Anxiety    mental retardation  . Bladder wall thickening 10/06/2011  . BPH (benign prostatic hypertrophy)   . Diabetes mellitus   . Dialysis patient [facility])    Tuesday, Thursday and Saturday,   . DVT of leg (deep venous thrombosis) ([place]) 12/25/2016  . Edema     history of lower extremity edema  . GERD (gastroesophageal reflux disease)   . Heme positive stool   . Hydronephrosis   . Hyperkalemia   . Hyperlipidemia   . Hypernatremia   . Hypertension   . Hypothyroidism   . Impaired speech   . 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 MR (mental retardation)   . Muscle weakness   . Obstructive uropathy   . 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 Poor historian poor historian  . Protein calorie malnutrition ([place])   . Pyelonephritis   . Renal failure (ARF), acute on chronic (HCC)   . Renal insufficiency    chronic history  . Sepsis ([place])   . Smoking   . Uremia   . Urinary retention   . UTI (lower urinary tract infection) 10/06/2011    Past Surgical History:  Procedure Laterality Date  .  A/V FISTULAGRAM N/A 08/13/2018   Procedure: A/V FISTULAGRAM - Right Upper;  Surgeon: Elam Dutch, MD;  Location: [place] [facility];  Service: Cardiovascular;  Laterality: N/A;  . A/V FISTULAGRAM N/A 11/22/2018   Procedure: A/V FISTULAGRAM - Right Upper;  Surgeon: Waynetta Sandy, MD;  Location: [place] CV [facility];  Service: Cardiovascular;  Laterality: N/A;  . AV FISTULA PLACEMENT Left 07/06/2015   Procedure:  INSERTION LEFT ARM ARTERIOVENOUS GORTEX GRAFT;  Surgeon: Angelia Mould, MD;  Location: [place];  Service: Vascular;  Laterality: Left;  . AV FISTULA PLACEMENT Right 02/26/2016   Procedure: ARTERIOVENOUS (AV) FISTULA CREATION ;  Surgeon: Angelia Mould, MD;  Location: [place];  Service: Vascular;  Laterality: Right;  . AV FISTULA PLACEMENT Right 11/25/2018   Procedure: INSERTION OF ARTERIOVENOUS (AV) ARTEGRAFT RIGHT UPPER ARM;  Surgeon: Waynetta Sandy, MD;  Location: [place];  Service: Vascular;  Laterality: Right;  . [place] REMOVAL Left 10/09/2015   Procedure: REMOVAL OF ARTERIOVENOUS GORETEX GRAFT ([place]) Evacuation of Lymphocele, Vein Patch angioplasty of brachial artery.;  Surgeon: Angelia Mould, MD;  Location: [place];  Service: Vascular;  Laterality: Left;  . BASCILIC VEIN TRANSPOSITION Right 02/26/2016   Procedure: Right BASCILIC VEIN TRANSPOSITION;  Surgeon: Angelia Mould, MD;  Location: [place];  Service: Vascular;  Laterality: Right;  . CIRCUMCISION N/A 01/04/2014   Procedure: CIRCUMCISION ADULT (procedure #1);  Surgeon: Marissa Nestle, MD;  Location[facility];  Service: Urology;  Laterality: N/A;  . COLECTOMY N/A 05/04/2017   Procedure: TOTAL COLECTOMY;  Surgeon: Aviva Signs, MD;  Location[facility];  Service: General;  Laterality: N/A;  . COLONOSCOPY N/A 04/27/2017   Procedure: COLONOSCOPY;  Surgeon: Daneil Dolin, MD;  [facility];  Service: Endoscopy;  Laterality: N/A;  245  . CYSTOSCOPY W/ RETROGRADES Bilateral 06/29/2015    Procedure: CYSTOSCOPY, DILATION OF URETHRAL STRICTURE WITH BILATERAL RETROGRADE PYELOGRAM,SUPRAPUBIC TUBE CHANGE;  Surgeon: Festus Aloe, MD;  [facility]: [facility];  Service: Urology;  Laterality: Bilateral;  . CYSTOSCOPY WITH URETHRAL DILATATION N/A 12/29/2013   Procedure: CYSTOSCOPY WITH URETHRAL DILATATION;  Surgeon: Marissa Nestle, MD;  [facility];  Service: Urology;  Laterality: N/A;  . ESOPHAGOGASTRODUODENOSCOPY N/A 04/27/2017   Procedure: ESOPHAGOGASTRODUODENOSCOPY (EGD);  Surgeon: Daneil Dolin, MD;  [facility];  Service: Endoscopy;  Laterality: N/A;  . INSERTION OF DIALYSIS CATHETER Right 11/25/2018   Procedure: INSERTION OF DIALYSIS CATHETER RIGHT INTERNAL JUGULAR;  Surgeon: Waynetta Sandy, MD;  Location: [place];  Service: Vascular;  Laterality: Right;  . IR AV DIALY SHUNT INTRO [place] W/PTA/IMG RIGHT Right 09/07/2018  . IR REMOVAL TUN CV CATH W/O FL  01/12/2019  . IR THROMBECTOMY AV FISTULA W/THROMBOLYSIS/PTA INC/SHUNT/IMG RIGHT Right 04/26/2018  . IR US GUIDE VASC ACCESS RIGHT  04/26/2018  . IR US GUIDE VASC ACCESS RIGHT  09/07/2018  . ORIF FEMUR FRACTURE Right 11/22/2016   Procedure: OPEN REDUCTION INTERNAL FIXATION (ORIF) DISTAL FEMUR FRACTURE;  Surgeon: Rod Can, MD;  Location: [place];  Service: Orthopedics;  Laterality: Right;  . PATCH ANGIOPLASTY Right 12/10/2017   Procedure: PATCH ANGIOPLASTY;  Surgeon: Angelia Mould, MD;  Location[facility] OR;  Service: Vascular;  Laterality: Right;  . PERIPHERAL VASCULAR BALLOON ANGIOPLASTY  08/13/2018   Procedure: PERIPHERAL VASCULAR BALLOON ANGIOPLASTY;  Surgeon: Elam Dutch, MD;  Location: [place] [facility];  Service: Cardiovascular;;  right AV fistula   . PERIPHERAL VASCULAR BALLOON ANGIOPLASTY  11/22/2018   Procedure: PERIPHERAL VASCULAR BALLOON ANGIOPLASTY;  Surgeon: Waynetta Sandy, MD;  Location: [place] [facility];  Service: Cardiovascular;;  rt AV fistula  . PERIPHERAL  VASCULAR CATHETERIZATION N/A 10/08/2015   Procedure: A/V Shuntogram;  Surgeon: Angelia Mould, MD;  Location: [place] [facility];  Service: Cardiovascular;  Laterality: N/A;  . THROMBECTOMY W/ EMBOLECTOMY Right 12/10/2017   Procedure: THROMBECTOMY REVISION RIGHT ARM  ARTERIOVENOUS FISTULA;  Surgeon: Angelia Mould, MD;  Location: [place];  Service: Vascular;  Laterality: Right;  . TRANSURETHRAL RESECTION OF PROSTATE N/A 01/04/2014   Procedure: TRANSURETHRAL RESECTION OF THE PROSTATE (TURP) (procedure #2);  Surgeon: Marissa Nestle, MD;  [facility];  Service: Urology;  Laterality: N/A;    Social History   Socioeconomic History  . Marital status: Single    Spouse name: Not on file  . Number of children: Not on file  . Years of education: Not on file  . Highest education level: Not on file  Occupational History  . Occupation: retired   Tobacco Use  . Smoking status: Never Smoker  . Smokeless tobacco: Never Used  Vaping Use  . Vaping Use: Never used  Substance and Sexual Activity  . Alcohol use: No  . Drug use: No  . Sexual activity: Not Currently  Other Topics  Concern  . 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   Long term resident of [facility]    Social Determinants of Health   Financial Resource Strain:   . Difficulty of Paying Living Expenses: Not on file  Food Insecurity:   . Worried About Charity fundraiser in the Last Year: Not on file  . Ran Out of Food in the Last Year: Not on file  Transportation Needs:   . Lack of Transportation (Medical): Not on file  . Lack of Transportation (Non-Medical): Not on file  Physical Activity: Unknown  . Days of Exercise per Week: Not on file  . Minutes of Exercise per Session: 0 min  Stress:   . Feeling of Stress : Not on file  Social Connections: Unknown  . Frequency of Communication with Friends and Family: Not on file  . Frequency of  Social Gatherings with Friends and Family: Never  . Attends Religious Services: Not on file  . Active Member of Clubs or Organizations: Not on file  . Attends Archivist Meetings: Not on file  . Marital Status: Not on file  Intimate Partner Violence:   . Fear of Current or Ex-Partner: Not on file  . Emotionally Abused: Not on file  . Physically Abused: Not on file  . Sexually Abused: Not on file   Family History  Problem Relation Age of Onset  . Cancer Mother   . Colon cancer Neg Hx       VITAL SIGNS BP 99/62   Pulse (!) 101   Temp 97.7 F (36.5 C)   Resp 18   Ht 5\' 8"  (1.727 m)   Wt 218 lb (98.9 kg)   SpO2 100%   BMI 33.15 kg/m   Outpatient Encounter Medications as of 08/13/2020  Medication Sig  . Amino Acids-Protein Hydrolys (FEEDING SUPPLEMENT, PRO-STAT 64,) LIQD Take 30 mLs by mouth 2 (two) times daily between meals.  Marland Kitchen atorvastatin (LIPITOR) 80 MG tablet Take 80 mg by mouth every evening.  . insulin aspart (NOVOLOG FLEXPEN) 100 UNIT/ML FlexPen Inject 15 Units into the skin 3 (three) times daily with meals. Hold insulin on dialysis days if cbg's less than 150 on mon,wed,fri  . insulin aspart (NOVOLOG FLEXPEN) 100 UNIT/ML FlexPen Novolog Flexpen U-100 Insulin (insulin aspart u-100) insulin pen; 100 unit/mL (3 mL); amt: 15 units; subcutaneous Special Instructions: hold insulin on dialysis days if cbg's less than 150 on mon,wed,fri With Meals on Mon, Wed, Fri 09:00 AM, 12:00 PM, 06:00 PM  . Insulin Glargine (BASAGLAR KWIKPEN) 100 UNIT/ML SOPN Inject 45 Units into the skin at bedtime.   Marland Kitchen JANUVIA 25 MG tablet Take 25 mg by mouth daily.   Marland Kitchen levothyroxine (SYNTHROID, LEVOTHROID) 88 MCG tablet Take 88 mcg by mouth daily before breakfast.  . midodrine (PROAMATINE) 10 MG tablet Take 10 mg by mouth. Send med with resident on Mon/Wed/Fri to dialysis. do not give [facility] give to resident to take with him. send with 5mg  to equal 15mg .  . midodrine (PROAMATINE) 5  MG tablet Take 5 mg by mouth. Send with resident on dialysis days.  Mon, Wed, Fri.  Take along with 10 mg to equal 15 mg  . NON FORMULARY 1200cc fluid restrictions. Dietary to provide 400cc/24hrs Nursing to give 7-3=140cc/24hrs 3-11=560cc/24hrs 11-7=100cc/24 Special Instructions: Document amount of fluids given each shift. Every Shift Day, Evening, Night  . NON FORMULARY Diet Type:  NAS, Cons CHO  Diet  . omega-3 acid ethyl esters (LOVAZA) 1 g capsule Take 2 g by mouth at bedtime.  Marland Kitchen omeprazole (PRILOSEC) 40 MG capsule Take 40 mg by mouth daily.   . polyethylene glycol (MIRALAX / GLYCOLAX) packet Take 17 g by mouth daily as needed for mild constipation.   . sevelamer carbonate (RENVELA) 800 MG tablet Take 1,800 mg by mouth 3 (three) times daily.   . sevelamer carbonate (RENVELA) 800 MG tablet Take 800 mg by mouth daily. With snacks  . tamsulosin (FLOMAX) 0.4 MG CAPS capsule Take 0.4 mg by mouth every evening. Give 30 minutes after a meal  . torsemide (DEMADEX) 10 MG tablet Take 5 tablets (50 mg total) by mouth daily.   No facility-administered encounter medications on file as of 08/13/2020.     SIGNIFICANT DIAGNOSTIC EXAMS   LABS REVIEWED PREVIOUS;  09-07-19: hgb a1c 7.5 09-16-19: wbc 8.0; hgb 16.4; hct 50.6; mcv 107.2 plt 141; glucose 165; bun 40; creat 10.47; k+ 3.7; na++ 135; ca 9.3; liver normal albumin 4.6; stool guaiac neg  12-09-19: hgb a1c 6.9; chol 94; ldl 4 trig 339; hdl 22 tsh 3.202 04-09-20: wbc 6.2; hgb 10.7; hct 33.5; mcv 109.1 plt 144;glucose 185; bun 43; creat 10.75; k+ 4.3; na++ 137; ca 8.7 liver normal albumin 3.7 hgb a1c 7.5 07-04-20: PSA: 2.79   TODAY;   07-19-20: hgb a1c 7.7    Review of Systems  Constitutional: Negative for malaise/fatigue.  Respiratory: Negative for cough and shortness of breath.   Cardiovascular: Negative for chest pain, palpitations and leg swelling.  Gastrointestinal: Negative for abdominal pain, constipation and heartburn.  Musculoskeletal:  Negative for back pain, joint pain and myalgias.  Skin: Negative.   Neurological: Negative for dizziness.  Psychiatric/Behavioral: The patient is not nervous/anxious.     Physical Exam Constitutional:      General: He is not in acute distress.    Appearance: He is well-developed. He is not diaphoretic.  Neck:     Thyroid: No thyromegaly.  Cardiovascular:     Rate and Rhythm: Normal rate and regular rhythm.     Pulses: Normal pulses.     Heart sounds: Normal heart sounds.  Pulmonary:     Effort: Pulmonary effort is normal. No respiratory distress.     Breath sounds: Normal breath sounds.  Abdominal:     General: Bowel sounds are normal. There is no distension.     Palpations: Abdomen is soft.     Tenderness: There is no abdominal tenderness.  Genitourinary:    Comments: History of turp Musculoskeletal:     Cervical back: Neck supple.     Right lower leg: No edema.     Left lower leg: No edema.     Comments: Is able to move all extremities History of right femur ORIF            Lymphadenopathy:     Cervical: No cervical adenopathy.  Skin:    General: Skin is warm and dry.     Comments: Right upper extremity a/v fistula + thrill      Neurological:     Mental Status: He is alert. Mental status is at baseline.  Psychiatric:        Mood and Affect: Mood normal.    ASSESSMENT/ PLAN:  TODAY  1. Type 2 diabetes mellitus with hypertension and end stage renal disease hgb a1c 7.7 cbg at hs are elevated. Will continue januvia 25 mg daily basaglar 40 units nightly and will change to  novolog 10 units with lunch and supper and 20 units with supper.   2. Bilateral lower extremity edema: is stable will continue demadex 50 mg daily   3. Orthostatic hypotension is stable b/p 99/62 will continue midodrine 15 mg with dialysis      PREVIOUS   4. GERD without esophagitis: is stable will continue prilosec 40 mg daily   5. Malignant neoplasm of colon: is status post colectomy 05-04-17  will monitor   6. Dyslipidemia associated with type 2 diabetes mellitus is stable trig 339; will continue lipitor 80 mg daily and lovaza 2 gm daily   7. 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 is stable will continue dialysis three times weekly and is followed by nephrology will continue midodrine 15 mg with dialysis 1200 cc fluid restriction renvela 1600 mg with meals and 800 mg with snacks.  8. Other specified hypothyroidism: is stable tsh 3.202 will continue synthroid 88 mcg daily   9. 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 Major depression single episode moderate: is presently stable is off medications will monitor      MD is aware of resident's narcotic use and is in agreement with current plan of care. We will attempt to wean resident as appropriate.  Ok Edwards NP [facility]  Contact [phone] Monday through Friday 8am- 5pm  After hours call [phone]

## 2020-08-15 DIAGNOSIS — Z794 Long term (current) use of insulin: Secondary | ICD-10-CM | POA: Diagnosis not present

## 2020-08-15 DIAGNOSIS — N186 End stage renal disease: Secondary | ICD-10-CM | POA: Diagnosis not present

## 2020-08-15 DIAGNOSIS — M2141 Flat foot [pes planus] (acquired), right foot: Secondary | ICD-10-CM | POA: Diagnosis not present

## 2020-08-15 DIAGNOSIS — Z992 Dependence on renal dialysis: Secondary | ICD-10-CM | POA: Diagnosis not present

## 2020-08-15 DIAGNOSIS — D509 Iron deficiency anemia, unspecified: Secondary | ICD-10-CM | POA: Diagnosis not present

## 2020-08-15 DIAGNOSIS — B351 Tinea unguium: Secondary | ICD-10-CM | POA: Diagnosis not present

## 2020-08-15 DIAGNOSIS — D631 Anemia in chronic kidney disease: Secondary | ICD-10-CM | POA: Diagnosis not present

## 2020-08-15 DIAGNOSIS — N2581 Secondary hyperparathyroidism of renal origin: Secondary | ICD-10-CM | POA: Diagnosis not present

## 2020-08-15 DIAGNOSIS — L603 Nail dystrophy: Secondary | ICD-10-CM | POA: Diagnosis not present

## 2020-08-15 DIAGNOSIS — E1151 Type 2 diabetes mellitus with diabetic peripheral angiopathy without gangrene: Secondary | ICD-10-CM | POA: Diagnosis not present

## 2020-08-17 DIAGNOSIS — N186 End stage renal disease: Secondary | ICD-10-CM | POA: Diagnosis not present

## 2020-08-17 DIAGNOSIS — N2581 Secondary hyperparathyroidism of renal origin: Secondary | ICD-10-CM | POA: Diagnosis not present

## 2020-08-17 DIAGNOSIS — D631 Anemia in chronic kidney disease: Secondary | ICD-10-CM | POA: Diagnosis not present

## 2020-08-17 DIAGNOSIS — Z992 Dependence on renal dialysis: Secondary | ICD-10-CM | POA: Diagnosis not present

## 2020-08-17 DIAGNOSIS — D509 Iron deficiency anemia, unspecified: Secondary | ICD-10-CM | POA: Diagnosis not present

## 2020-08-20 DIAGNOSIS — N186 End stage renal disease: Secondary | ICD-10-CM | POA: Diagnosis not present

## 2020-08-20 DIAGNOSIS — Z992 Dependence on renal dialysis: Secondary | ICD-10-CM | POA: Diagnosis not present

## 2020-08-20 DIAGNOSIS — D631 Anemia in chronic kidney disease: Secondary | ICD-10-CM | POA: Diagnosis not present

## 2020-08-20 DIAGNOSIS — N2581 Secondary hyperparathyroidism of renal origin: Secondary | ICD-10-CM | POA: Diagnosis not present

## 2020-08-20 DIAGNOSIS — D509 Iron deficiency anemia, unspecified: Secondary | ICD-10-CM | POA: Diagnosis not present

## 2020-08-22 DIAGNOSIS — N186 End stage renal disease: Secondary | ICD-10-CM | POA: Diagnosis not present

## 2020-08-22 DIAGNOSIS — N2581 Secondary hyperparathyroidism of renal origin: Secondary | ICD-10-CM | POA: Diagnosis not present

## 2020-08-22 DIAGNOSIS — Z992 Dependence on renal dialysis: Secondary | ICD-10-CM | POA: Diagnosis not present

## 2020-08-22 DIAGNOSIS — D509 Iron deficiency anemia, unspecified: Secondary | ICD-10-CM | POA: Diagnosis not present

## 2020-08-22 DIAGNOSIS — D631 Anemia in chronic kidney disease: Secondary | ICD-10-CM | POA: Diagnosis not present

## 2020-08-24 DIAGNOSIS — N2581 Secondary hyperparathyroidism of renal origin: Secondary | ICD-10-CM | POA: Diagnosis not present

## 2020-08-24 DIAGNOSIS — D631 Anemia in chronic kidney disease: Secondary | ICD-10-CM | POA: Diagnosis not present

## 2020-08-24 DIAGNOSIS — Z992 Dependence on renal dialysis: Secondary | ICD-10-CM | POA: Diagnosis not present

## 2020-08-24 DIAGNOSIS — N186 End stage renal disease: Secondary | ICD-10-CM | POA: Diagnosis not present

## 2020-08-24 DIAGNOSIS — D509 Iron deficiency anemia, unspecified: Secondary | ICD-10-CM | POA: Diagnosis not present

## 2020-08-26 DIAGNOSIS — N186 End stage renal disease: Secondary | ICD-10-CM | POA: Diagnosis not present

## 2020-08-26 DIAGNOSIS — Z992 Dependence on renal dialysis: Secondary | ICD-10-CM | POA: Diagnosis not present

## 2020-08-27 DIAGNOSIS — Z992 Dependence on renal dialysis: Secondary | ICD-10-CM | POA: Diagnosis not present

## 2020-08-27 DIAGNOSIS — N2581 Secondary hyperparathyroidism of renal origin: Secondary | ICD-10-CM | POA: Diagnosis not present

## 2020-08-27 DIAGNOSIS — N186 End stage renal disease: Secondary | ICD-10-CM | POA: Diagnosis not present

## 2020-08-27 DIAGNOSIS — D509 Iron deficiency anemia, unspecified: Secondary | ICD-10-CM | POA: Diagnosis not present

## 2020-08-27 DIAGNOSIS — D631 Anemia in chronic kidney disease: Secondary | ICD-10-CM | POA: Diagnosis not present

## 2020-08-29 DIAGNOSIS — N186 End stage renal disease: Secondary | ICD-10-CM | POA: Diagnosis not present

## 2020-08-29 DIAGNOSIS — N2581 Secondary hyperparathyroidism of renal origin: Secondary | ICD-10-CM | POA: Diagnosis not present

## 2020-08-29 DIAGNOSIS — Z992 Dependence on renal dialysis: Secondary | ICD-10-CM | POA: Diagnosis not present

## 2020-08-29 DIAGNOSIS — D631 Anemia in chronic kidney disease: Secondary | ICD-10-CM | POA: Diagnosis not present

## 2020-08-29 DIAGNOSIS — D509 Iron deficiency anemia, unspecified: Secondary | ICD-10-CM | POA: Diagnosis not present

## 2020-08-30 DIAGNOSIS — Z23 Encounter for immunization: Secondary | ICD-10-CM | POA: Diagnosis not present

## 2020-08-31 DIAGNOSIS — D509 Iron deficiency anemia, unspecified: Secondary | ICD-10-CM | POA: Diagnosis not present

## 2020-08-31 DIAGNOSIS — N2581 Secondary hyperparathyroidism of renal origin: Secondary | ICD-10-CM | POA: Diagnosis not present

## 2020-08-31 DIAGNOSIS — Z992 Dependence on renal dialysis: Secondary | ICD-10-CM | POA: Diagnosis not present

## 2020-08-31 DIAGNOSIS — D631 Anemia in chronic kidney disease: Secondary | ICD-10-CM | POA: Diagnosis not present

## 2020-08-31 DIAGNOSIS — N186 End stage renal disease: Secondary | ICD-10-CM | POA: Diagnosis not present

## 2020-09-03 DIAGNOSIS — N2581 Secondary hyperparathyroidism of renal origin: Secondary | ICD-10-CM | POA: Diagnosis not present

## 2020-09-03 DIAGNOSIS — D631 Anemia in chronic kidney disease: Secondary | ICD-10-CM | POA: Diagnosis not present

## 2020-09-03 DIAGNOSIS — Z992 Dependence on renal dialysis: Secondary | ICD-10-CM | POA: Diagnosis not present

## 2020-09-03 DIAGNOSIS — N186 End stage renal disease: Secondary | ICD-10-CM | POA: Diagnosis not present

## 2020-09-03 DIAGNOSIS — D509 Iron deficiency anemia, unspecified: Secondary | ICD-10-CM | POA: Diagnosis not present

## 2020-09-05 DIAGNOSIS — Z992 Dependence on renal dialysis: Secondary | ICD-10-CM | POA: Diagnosis not present

## 2020-09-05 DIAGNOSIS — N186 End stage renal disease: Secondary | ICD-10-CM | POA: Diagnosis not present

## 2020-09-05 DIAGNOSIS — N2581 Secondary hyperparathyroidism of renal origin: Secondary | ICD-10-CM | POA: Diagnosis not present

## 2020-09-05 DIAGNOSIS — D631 Anemia in chronic kidney disease: Secondary | ICD-10-CM | POA: Diagnosis not present

## 2020-09-05 DIAGNOSIS — D509 Iron deficiency anemia, unspecified: Secondary | ICD-10-CM | POA: Diagnosis not present

## 2020-09-07 DIAGNOSIS — N186 End stage renal disease: Secondary | ICD-10-CM | POA: Diagnosis not present

## 2020-09-07 DIAGNOSIS — N2581 Secondary hyperparathyroidism of renal origin: Secondary | ICD-10-CM | POA: Diagnosis not present

## 2020-09-07 DIAGNOSIS — D509 Iron deficiency anemia, unspecified: Secondary | ICD-10-CM | POA: Diagnosis not present

## 2020-09-07 DIAGNOSIS — D631 Anemia in chronic kidney disease: Secondary | ICD-10-CM | POA: Diagnosis not present

## 2020-09-07 DIAGNOSIS — Z992 Dependence on renal dialysis: Secondary | ICD-10-CM | POA: Diagnosis not present

## 2020-09-10 ENCOUNTER — Other Ambulatory Visit: Payer: Self-pay

## 2020-09-10 ENCOUNTER — Encounter (HOSPITAL_COMMUNITY): Payer: Self-pay

## 2020-09-10 ENCOUNTER — Encounter: Payer: Self-pay | Admitting: Adult Health

## 2020-09-10 ENCOUNTER — Non-Acute Institutional Stay (SKILLED_NURSING_FACILITY): Payer: Medicare Other | Admitting: Adult Health

## 2020-09-10 ENCOUNTER — Inpatient Hospital Stay
Admission: RE | Admit: 2020-09-10 | Discharge: 2020-10-16 | Disposition: A | Discharge status: Other Acute Inpatient Hospital | Payer: Medicare Other | Source: Ambulatory Visit | Attending: Internal Medicine | Admitting: Internal Medicine

## 2020-09-10 ENCOUNTER — Emergency Department (HOSPITAL_COMMUNITY)
Admission: EM | Admit: 2020-09-10 | Discharge: 2020-09-10 | Disposition: A | Discharge status: Home / Self Care | Payer: Medicare Other | Attending: Emergency Medicine | Admitting: Emergency Medicine

## 2020-09-10 DIAGNOSIS — R112 Nausea with vomiting, unspecified: Secondary | ICD-10-CM | POA: Diagnosis not present

## 2020-09-10 DIAGNOSIS — R0902 Hypoxemia: Secondary | ICD-10-CM | POA: Diagnosis not present

## 2020-09-10 DIAGNOSIS — E039 Hypothyroidism, unspecified: Secondary | ICD-10-CM | POA: Diagnosis not present

## 2020-09-10 DIAGNOSIS — Z79899 Other long term (current) drug therapy: Secondary | ICD-10-CM | POA: Diagnosis not present

## 2020-09-10 DIAGNOSIS — I959 Hypotension, unspecified: Secondary | ICD-10-CM | POA: Diagnosis not present

## 2020-09-10 DIAGNOSIS — Z992 Dependence on renal dialysis: Secondary | ICD-10-CM | POA: Diagnosis not present

## 2020-09-10 DIAGNOSIS — N186 End stage renal disease: Principal | ICD-10-CM

## 2020-09-10 DIAGNOSIS — R531 Weakness: Secondary | ICD-10-CM | POA: Diagnosis not present

## 2020-09-10 DIAGNOSIS — K219 Gastro-esophageal reflux disease without esophagitis: Secondary | ICD-10-CM | POA: Diagnosis not present

## 2020-09-10 DIAGNOSIS — D631 Anemia in chronic kidney disease: Secondary | ICD-10-CM | POA: Diagnosis not present

## 2020-09-10 DIAGNOSIS — I953 Hypotension of hemodialysis: Secondary | ICD-10-CM

## 2020-09-10 DIAGNOSIS — I120 Hypertensive chronic kidney disease with stage 5 chronic kidney disease or end stage renal disease: Secondary | ICD-10-CM | POA: Diagnosis not present

## 2020-09-10 DIAGNOSIS — Z794 Long term (current) use of insulin: Secondary | ICD-10-CM | POA: Diagnosis not present

## 2020-09-10 DIAGNOSIS — E1122 Type 2 diabetes mellitus with diabetic chronic kidney disease: Secondary | ICD-10-CM | POA: Diagnosis not present

## 2020-09-10 DIAGNOSIS — I700 Atherosclerosis of aorta: Secondary | ICD-10-CM | POA: Diagnosis not present

## 2020-09-10 DIAGNOSIS — C189 Malignant neoplasm of colon, unspecified: Secondary | ICD-10-CM

## 2020-09-10 DIAGNOSIS — D509 Iron deficiency anemia, unspecified: Secondary | ICD-10-CM | POA: Diagnosis not present

## 2020-09-10 DIAGNOSIS — N2581 Secondary hyperparathyroidism of renal origin: Secondary | ICD-10-CM | POA: Diagnosis not present

## 2020-09-10 LAB — CBC WITH DIFFERENTIAL/PLATELET
Abs Immature Granulocytes: 0.1 10*3/uL — ABNORMAL HIGH (ref 0.00–0.07)
Basophils Absolute: 0 10*3/uL (ref 0.0–0.1)
Basophils Relative: 0 %
Eosinophils Absolute: 0 10*3/uL (ref 0.0–0.5)
Eosinophils Relative: 0 %
HCT: 40.4 % (ref 39.0–52.0)
Hemoglobin: 12.9 g/dL — ABNORMAL LOW (ref 13.0–17.0)
Immature Granulocytes: 1 %
Lymphocytes Relative: 11 %
Lymphs Abs: 1.3 10*3/uL (ref 0.7–4.0)
MCH: 34.7 pg — ABNORMAL HIGH (ref 26.0–34.0)
MCHC: 31.9 g/dL (ref 30.0–36.0)
MCV: 108.6 fL — ABNORMAL HIGH (ref 80.0–100.0)
Monocytes Absolute: 0.8 10*3/uL (ref 0.1–1.0)
Monocytes Relative: 7 %
Neutro Abs: 9.6 10*3/uL — ABNORMAL HIGH (ref 1.7–7.7)
Neutrophils Relative %: 81 %
Platelets: 163 10*3/uL (ref 150–400)
RBC: 3.72 MIL/uL — ABNORMAL LOW (ref 4.22–5.81)
RDW: 13.9 % (ref 11.5–15.5)
WBC: 11.8 10*3/uL — ABNORMAL HIGH (ref 4.0–10.5)
nRBC: 0 % (ref 0.0–0.2)

## 2020-09-10 LAB — BASIC METABOLIC PANEL
Anion gap: 17 — ABNORMAL HIGH (ref 5–15)
BUN: 30 mg/dL — ABNORMAL HIGH (ref 8–23)
CO2: 25 mmol/L (ref 22–32)
Calcium: 9.2 mg/dL (ref 8.9–10.3)
Chloride: 96 mmol/L — ABNORMAL LOW (ref 98–111)
Creatinine, Ser: 8.92 mg/dL — ABNORMAL HIGH (ref 0.61–1.24)
GFR, Estimated: 6 mL/min — ABNORMAL LOW (ref >60)
Glucose, Bld: 225 mg/dL — ABNORMAL HIGH (ref 70–99)
Potassium: 3.8 mmol/L (ref 3.5–5.1)
Sodium: 138 mmol/L (ref 135–145)

## 2020-09-10 MED ORDER — SODIUM CHLORIDE 0.9 % IV BOLUS: 500.0000 mL | Freq: Once | INTRAVENOUS | Status: DC

## 2020-09-10 NOTE — Discharge Instructions (Addendum)
Zachary [name] labs today were reassuring.  Follow-up with his primary provider for recheck if needed

## 2020-09-10 NOTE — ED Triage Notes (Signed)
Pt brought to ED via [facility] for hypotension after dialysis treatment today. Pt systolic BP in 00'P. Pt received 500 NS PTA. Pt is a [employer] resident.

## 2020-09-10 NOTE — ED Provider Notes (Signed)
[facility] EMERGENCY DEPARTMENT Provider Note   CSN: [id number] Arrival date & time: 09/10/20  1251     History Chief Complaint  Patient presents with  . Hypotension    Zachary [name] is a 74 y.o. male.  HPI      Zachary [name] is a 74 y.o. male with PMH of HTN, anemia, DM, ESRD on dialysis who presents to the Emergency Department via EMS for hypotension after receiving dialysis this morning.  Per EMS report, pt had a systolic pressure in the 71'G after receiving his dialysis treatment and he reported feeling dizzy.  He received a 500 cc of NS and sent to the ER for further evaluation.  On arrival, he now reports feeling better and his dizziness has resolved.    Pt is poor historian and unable to provide additional history.    Past Medical History:  Diagnosis Date  . Abnormal CT scan, kidney 10/06/2011  . Acute pyelonephritis 10/07/2011  . Anemia    normocytic  . Anxiety    mental retardation  . Bladder wall thickening 10/06/2011  . BPH (benign prostatic hypertrophy)   . Diabetes mellitus   . Dialysis patient [facility])    Tuesday, Thursday and Saturday,   . DVT of leg (deep venous thrombosis) ([place]) 12/25/2016  . Edema     history of lower extremity edema  . GERD (gastroesophageal reflux disease)   . Heme positive stool   . Hydronephrosis   . Hyperkalemia   . Hyperlipidemia   . Hypernatremia   . Hypertension   . Hypothyroidism   . Impaired speech   . 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 MR (mental retardation)   . Muscle weakness   . Obstructive uropathy   . 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 Poor historian poor historian  . Protein calorie malnutrition ([place])   . Pyelonephritis   . Renal failure (ARF), acute on chronic (HCC)   . Renal insufficiency    chronic history  . Sepsis ([place])   . Smoking   . Uremia   . Urinary retention   . UTI (lower urinary tract infection) 10/06/2011    Patient Active Problem List   Diagnosis Date Noted  . Medicare  annual wellness visit, subsequent 09/26/2019  . Depression, major, single episode, moderate ([place]) 08/09/2019  . Type 2 diabetes mellitus with hypertension and end stage renal disease on dialysis ([place]) 07/31/2019  . Orthostatic hypotension 02/17/2019  . GERD without esophagitis 02/17/2019  . Dyslipidemia associated with type 2 diabetes mellitus ([place]) 02/17/2019  . Bilateral lower extremity edema 02/17/2019  . End stage renal disease on dialysis due to type 2 diabetes mellitus ([place]) 02/13/2019  . Hypothyroidism 01/19/2018  . S/P colectomy 08/31/2017  . Malignant neoplasm of colon ([place]) 06/25/2017  . Anemia in chronic kidney disease 08/23/2016  . Dependence on renal dialysis ([place]) 08/23/2016  . Type 2 diabetes mellitus with unspecified diabetic retinopathy without macular edema ([place]) 08/23/2016  . Hydronephrosis, bilateral 03/10/2016  . Essential (primary) hypertension 07/08/2015  . Benign prostatic hyperplasia with urinary retention 03/03/2015  . Intellectual disability 10/10/2011  . Iron deficiency anemia 10/02/2011    Past Surgical History:  Procedure Laterality Date  . A/V FISTULAGRAM N/A 08/13/2018   Procedure: A/V FISTULAGRAM - Right Upper;  Surgeon: Elam Dutch, MD;  Location: [place] [facility];  Service: Cardiovascular;  Laterality: N/A;  . A/V FISTULAGRAM N/A 11/22/2018   Procedure: A/V FISTULAGRAM - Right Upper;  Surgeon: Waynetta Sandy, MD;  Location: [place] [facility];  Service: Cardiovascular;  Laterality: N/A;  . AV FISTULA PLACEMENT Left 07/06/2015   Procedure:  INSERTION LEFT ARM ARTERIOVENOUS GORTEX GRAFT;  Surgeon: Angelia Mould, MD;  Location: [place];  Service: Vascular;  Laterality: Left;  . AV FISTULA PLACEMENT Right 02/26/2016   Procedure: ARTERIOVENOUS (AV) FISTULA CREATION ;  Surgeon: Angelia Mould, MD;  Location: [place];  Service: Vascular;  Laterality: Right;  . AV FISTULA PLACEMENT Right 11/25/2018   Procedure: INSERTION OF  ARTERIOVENOUS (AV) ARTEGRAFT RIGHT UPPER ARM;  Surgeon: Waynetta Sandy, MD;  Location: [place];  Service: Vascular;  Laterality: Right;  . [place] REMOVAL Left 10/09/2015   Procedure: REMOVAL OF ARTERIOVENOUS GORETEX GRAFT ([place]) Evacuation of Lymphocele, Vein Patch angioplasty of brachial artery.;  Surgeon: Angelia Mould, MD;  Location: [place];  Service: Vascular;  Laterality: Left;  . BASCILIC VEIN TRANSPOSITION Right 02/26/2016   Procedure: Right BASCILIC VEIN TRANSPOSITION;  Surgeon: Angelia Mould, MD;  Location: [place];  Service: Vascular;  Laterality: Right;  . CIRCUMCISION N/A 01/04/2014   Procedure: CIRCUMCISION ADULT (procedure #1);  Surgeon: Marissa Nestle, MD;  Location: [facility];  Service: Urology;  Laterality: N/A;  . COLECTOMY N/A 05/04/2017   Procedure: TOTAL COLECTOMY;  Surgeon: Aviva Signs, MD;  Location: [facility];  Service: General;  Laterality: N/A;  . COLONOSCOPY N/A 04/27/2017   Procedure: COLONOSCOPY;  Surgeon: Daneil Dolin, MD;  [facility];  Service: Endoscopy;  Laterality: N/A;  245  . CYSTOSCOPY W/ RETROGRADES Bilateral 06/29/2015   Procedure: CYSTOSCOPY, DILATION OF URETHRAL STRICTURE WITH BILATERAL RETROGRADE PYELOGRAM,SUPRAPUBIC TUBE CHANGE;  Surgeon: Festus Aloe, MD;  Location: [facility];  Service: Urology;  Laterality: Bilateral;  . CYSTOSCOPY WITH URETHRAL DILATATION N/A 12/29/2013   Procedure: CYSTOSCOPY WITH URETHRAL DILATATION;  Surgeon: Marissa Nestle, MD;  [facility];  Service: Urology;  Laterality: N/A;  . ESOPHAGOGASTRODUODENOSCOPY N/A 04/27/2017   Procedure: ESOPHAGOGASTRODUODENOSCOPY (EGD);  Surgeon: Daneil Dolin, MD;  [facility];  Service: Endoscopy;  Laterality: N/A;  . INSERTION OF DIALYSIS CATHETER Right 11/25/2018   Procedure: INSERTION OF DIALYSIS CATHETER RIGHT INTERNAL JUGULAR;  Surgeon: Waynetta Sandy, MD;  Location: [place];  Service: Vascular;  Laterality: Right;  . IR AV DIALY SHUNT INTRO  [place] W/PTA/IMG RIGHT Right 09/07/2018  . IR REMOVAL TUN CV CATH W/O FL  01/12/2019  . IR THROMBECTOMY AV FISTULA W/THROMBOLYSIS/PTA INC/SHUNT/IMG RIGHT Right 04/26/2018  . IR US GUIDE VASC ACCESS RIGHT  04/26/2018  . IR US GUIDE VASC ACCESS RIGHT  09/07/2018  . ORIF FEMUR FRACTURE Right 11/22/2016   Procedure: OPEN REDUCTION INTERNAL FIXATION (ORIF) DISTAL FEMUR FRACTURE;  Surgeon: Rod Can, MD;  Location: [place];  Service: Orthopedics;  Laterality: Right;  . PATCH ANGIOPLASTY Right 12/10/2017   Procedure: PATCH ANGIOPLASTY;  Surgeon: Angelia Mould, MD;  Location: [facility] OR;  Service: Vascular;  Laterality: Right;  . PERIPHERAL VASCULAR BALLOON ANGIOPLASTY  08/13/2018   Procedure: PERIPHERAL VASCULAR BALLOON ANGIOPLASTY;  Surgeon: Elam Dutch, MD;  Location: [place] [facility];  Service: Cardiovascular;;  right AV fistula   . PERIPHERAL VASCULAR BALLOON ANGIOPLASTY  11/22/2018   Procedure: PERIPHERAL VASCULAR BALLOON ANGIOPLASTY;  Surgeon: Waynetta Sandy, MD;  Location: [place] [facility];  Service: Cardiovascular;;  rt AV fistula  . PERIPHERAL VASCULAR CATHETERIZATION N/A 10/08/2015   Procedure: A/V Shuntogram;  Surgeon: Angelia Mould, MD;  Location: [place] [facility];  Service:  Cardiovascular;  Laterality: N/A;  . THROMBECTOMY W/ EMBOLECTOMY Right 12/10/2017   Procedure: THROMBECTOMY REVISION RIGHT ARM  ARTERIOVENOUS FISTULA;  Surgeon: Angelia Mould, MD;  Location: [place];  Service: Vascular;  Laterality: Right;  . TRANSURETHRAL RESECTION OF PROSTATE N/A 01/04/2014   Procedure: TRANSURETHRAL RESECTION OF THE PROSTATE (TURP) (procedure #2);  Surgeon: Marissa Nestle, MD;  [facility];  Service: Urology;  Laterality: N/A;       Family History  Problem Relation Age of Onset  . Cancer Mother   . Colon cancer Neg Hx     Social History   Tobacco Use  . Smoking status: Never Smoker  . Smokeless tobacco: Never Used  Vaping Use    . Vaping Use: Never used  Substance Use Topics  . Alcohol use: No  . Drug use: No    Home Medications Prior to Admission medications   Medication Sig Start Date End Date Taking? Authorizing Provider  Amino Acids-Protein Hydrolys (FEEDING SUPPLEMENT, PRO-STAT 64,) LIQD Take 30 mLs by mouth 2 (two) times daily between meals. 07/07/20   [provider]  atorvastatin (LIPITOR) 80 MG tablet Take 80 mg by mouth every evening.    [provider]  insulin aspart (NOVOLOG FLEXPEN) 100 UNIT/ML FlexPen Inject 15 Units into the skin 3 (three) times daily with meals. Hold insulin on dialysis days if cbg's less than 150 on mon,wed,fri 07/17/20   [provider]  insulin aspart (NOVOLOG FLEXPEN) 100 UNIT/ML FlexPen Novolog Flexpen U-100 Insulin (insulin aspart u-100) insulin pen; 100 unit/mL (3 mL); amt: 15 units; subcutaneous Special Instructions: hold insulin on dialysis days if cbg's less than 150 on mon,wed,fri With Meals on Mon, Wed, Fri 09:00 AM, 12:00 PM, 06:00 PM 07/17/20   [provider]  Insulin Glargine (BASAGLAR KWIKPEN) 100 UNIT/ML SOPN Inject 45 Units into the skin at bedtime.  07/17/20   [provider]  JANUVIA 25 MG tablet Take 25 mg by mouth daily.  11/01/18   [provider]  levothyroxine (SYNTHROID, LEVOTHROID) 88 MCG tablet Take 88 mcg by mouth daily before breakfast.    [provider]  midodrine (PROAMATINE) 10 MG tablet Take 10 mg by mouth. Send med with resident on Mon/Wed/Fri to dialysis. do not give [facility] give to resident to take with him. send with 5mg  to equal 15mg . 06/01/19   [provider]  midodrine (PROAMATINE) 5 MG tablet Take 5 mg by mouth. Send with resident on dialysis days.  Mon, Wed, Fri.  Take along with 10 mg to equal 15 mg 06/01/19   [provider]  NON FORMULARY 1200cc fluid restrictions. Dietary to provide 400cc/24hrs Nursing to give 7-3=140cc/24hrs 3-11=560cc/24hrs  11-7=100cc/24 Special Instructions: Document amount of fluids given each shift. Every Shift Day, Evening, Night 07/06/20   [provider]  NON FORMULARY Diet Type:  NAS, Cons CHO Diet    [provider]  omega-3 acid ethyl esters (LOVAZA) 1 g capsule Take 2 g by mouth at bedtime. 06/03/19   [provider]  omeprazole (PRILOSEC) 40 MG capsule Take 40 mg by mouth daily.  01/23/20   [provider]  polyethylene glycol (MIRALAX / GLYCOLAX) packet Take 17 g by mouth daily as needed for mild constipation.  05/27/20   [provider]  sevelamer carbonate (RENVELA) 800 MG tablet Take 1,800 mg by mouth 3 (three) times daily.  07/11/20   [provider]  sevelamer carbonate (RENVELA) 800 MG tablet Take 800 mg by  mouth daily. With snacks    [provider]  tamsulosin (FLOMAX) 0.4 MG CAPS capsule Take 0.4 mg by mouth every evening. Give 30 minutes after a meal    [provider]  torsemide (DEMADEX) 10 MG tablet Take 5 tablets (50 mg total) by mouth daily. 04/07/16   [name], MD    Allergies    No known allergies  Review of Systems   Review of Systems  Constitutional: Negative for chills, fatigue and fever.  HENT: Negative for sore throat and trouble swallowing.   Respiratory: Negative for cough, shortness of breath and wheezing.   Cardiovascular: Negative for chest pain.  Gastrointestinal: Negative for abdominal pain, diarrhea, nausea and vomiting.  Musculoskeletal: Negative for arthralgias, back pain, myalgias, neck pain and neck stiffness.  Skin: Negative for rash.  Neurological: Positive for dizziness. Negative for syncope, speech difficulty, weakness, numbness and headaches.    Physical Exam Updated Vital Signs BP 102/64 (BP Location: Right Arm)   Pulse 97   Temp 97.8 F (36.6 C) (Oral)   Resp 18   Ht 5\' 8"  (1.727 m)   Wt 99.8 kg   SpO2 97%   BMI 33.45 kg/m   Physical Exam Vitals and nursing note reviewed.   Constitutional:      Appearance: Normal appearance. He is not ill-appearing.  HENT:     Mouth/Throat:     Mouth: Mucous membranes are moist.  Eyes:     Extraocular Movements: Extraocular movements intact.     Conjunctiva/sclera: Conjunctivae normal.     Pupils: Pupils are equal, round, and reactive to light.  Cardiovascular:     Rate and Rhythm: Normal rate and regular rhythm.     Pulses: Normal pulses.  Pulmonary:     Effort: Pulmonary effort is normal.     Breath sounds: Normal breath sounds.  Abdominal:     General: There is no distension.     Palpations: Abdomen is soft.     Tenderness: There is no abdominal tenderness.  Musculoskeletal:        General: Normal range of motion.     Cervical back: Normal range of motion.     Right lower leg: No edema.     Left lower leg: No edema.  Lymphadenopathy:     Cervical: No cervical adenopathy.  Skin:    General: Skin is warm.     Capillary Refill: Capillary refill takes less than 2 seconds.     Findings: No rash.  Neurological:     General: No focal deficit present.     Mental Status: He is alert.     Sensory: Sensation is intact. No sensory deficit.     Motor: Motor function is intact. No weakness.     Coordination: Coordination is intact.     Comments: CN II-XII intact.  Speech clear.  No pronator drift.  Pt is non ambulatory at baseline, but able to move all extremities w/o difficulty     ED Results / Procedures / Treatments   Labs (all labs ordered are listed, but only abnormal results are displayed) Labs Reviewed  BASIC METABOLIC PANEL  CBC WITH DIFFERENTIAL/PLATELET    EKG None  Radiology No results found.  Procedures Procedures (including critical care time)  Medications Ordered in ED Medications - No data to display  ED Course  I have reviewed the triage vital signs and the nursing notes.  Pertinent labs & imaging results that were available during my care of the patient were reviewed  by me and  considered in my medical decision making (see chart for details).    MDM Rules/Calculators/A&P                            1440 pt resting comfortably.  Denies symptoms.  Received 500cc fluid bolus PTA.  Moves all extremities well, no focal neuro deficits.  Does not ambulate at baseline, he is well appearing and vitals reviewed.    The patient appears reasonably screened and/or stabilized for discharge and I doubt any other medical condition or other [facility] requiring further screening, evaluation, or treatment in the ED at this time prior to discharge.  I spoke with [facility], who is pt's guardian, [name], and report given.     Final Clinical Impression(s) / ED Diagnoses Final diagnoses:  Hemodialysis-associated hypotension    Rx / DC Orders ED Discharge Orders    None       Kem Parkinson, PA-C 09/12/20 Walker, Julie, MD 09/20/20 [id number]

## 2020-09-10 NOTE — Progress Notes (Signed)
Location:    [facility] Room Number: 112/D Place of Service:  SNF (31)   CODE STATUS: Full Code  Allergies  Allergen Reactions  . No Known Allergies     Chief Complaint  Patient presents with  . Medical Management of Chronic Issues          Aortic atherosclerosis:  GERD without esophagitis:  Malignant neoplasm of colon:    HPI:  He is a 74 year old long term resident of this facility being seen for the management of his chronic illnesses: Aortic atherosclerosis:  GERD without esophagitis:  Malignant neoplasm of colon. There are no reports of uncontrolled pain. No reports of heart burn; no changes in stool. No reports of agitation or anxiety.   Past Medical History:  Diagnosis Date  . Abnormal CT scan, kidney 10/06/2011  . Acute pyelonephritis 10/07/2011  . Anemia    normocytic  . Anxiety    mental retardation  . Bladder wall thickening 10/06/2011  . BPH (benign prostatic hypertrophy)   . Diabetes mellitus   . Dialysis patient [facility])    Tuesday, Thursday and Saturday,   . DVT of leg (deep venous thrombosis) ([place]) 12/25/2016  . Edema     history of lower extremity edema  . GERD (gastroesophageal reflux disease)   . Heme positive stool   . Hydronephrosis   . Hyperkalemia   . Hyperlipidemia   . Hypernatremia   . Hypertension   . Hypothyroidism   . Impaired speech   . 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[place])   . MR (mental retardation)   . Muscle weakness   . Obstructive uropathy   . 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 Poor historian poor historian  . Protein calorie malnutrition ([place])   . Pyelonephritis   . Renal failure (ARF), acute on chronic (HCC)   . Renal insufficiency    chronic history  . Sepsis ([place])   . Smoking   . Uremia   . Urinary retention   . UTI (lower urinary tract infection) 10/06/2011    Past Surgical History:  Procedure Laterality Date  . A/V FISTULAGRAM N/A 08/13/2018   Procedure: A/V FISTULAGRAM - Right Upper;   Surgeon: Elam Dutch, MD;  Location: [place] [facility];  Service: Cardiovascular;  Laterality: N/A;  . A/V FISTULAGRAM N/A 11/22/2018   Procedure: A/V FISTULAGRAM - Right Upper;  Surgeon: Waynetta Sandy, MD;  Location: [place] [facility];  Service: Cardiovascular;  Laterality: N/A;  . AV FISTULA PLACEMENT Left 07/06/2015   Procedure:  INSERTION LEFT ARM ARTERIOVENOUS GORTEX GRAFT;  Surgeon: Angelia Mould, MD;  Location: [place];  Service: Vascular;  Laterality: Left;  . AV FISTULA PLACEMENT Right 02/26/2016   Procedure: ARTERIOVENOUS (AV) FISTULA CREATION ;  Surgeon: Angelia Mould, MD;  Location: [place];  Service: Vascular;  Laterality: Right;  . AV FISTULA PLACEMENT Right 11/25/2018   Procedure: INSERTION OF ARTERIOVENOUS (AV) ARTEGRAFT RIGHT UPPER ARM;  Surgeon: Waynetta Sandy, MD;  Location: [place];  Service: Vascular;  Laterality: Right;  . [place] REMOVAL Left 10/09/2015   Procedure: REMOVAL OF ARTERIOVENOUS GORETEX GRAFT ([place]) Evacuation of Lymphocele, Vein Patch angioplasty of brachial artery.;  Surgeon: Angelia Mould, MD;  Location: [place];  Service: Vascular;  Laterality: Left;  . BASCILIC VEIN TRANSPOSITION Right 02/26/2016   Procedure: Right BASCILIC VEIN TRANSPOSITION;  Surgeon: Angelia Mould, MD;  Location: [place];  Service: Vascular;  Laterality: Right;  . CIRCUMCISION N/A 01/04/2014  Procedure: CIRCUMCISION ADULT (procedure #1);  Surgeon: Marissa Nestle, MD;  Location: [facility];  Service: Urology;  Laterality: N/A;  . COLECTOMY N/A 05/04/2017   Procedure: TOTAL COLECTOMY;  Surgeon: Aviva Signs, MD;  Location: [facility];  Service: General;  Laterality: N/A;  . COLONOSCOPY N/A 04/27/2017   Procedure: COLONOSCOPY;  Surgeon: Daneil Dolin, MD;  [facility];  Service: Endoscopy;  Laterality: N/A;  245  . CYSTOSCOPY W/ RETROGRADES Bilateral 06/29/2015   Procedure: CYSTOSCOPY, DILATION OF URETHRAL STRICTURE WITH BILATERAL RETROGRADE  PYELOGRAM,SUPRAPUBIC TUBE CHANGE;  Surgeon: Festus Aloe, MD;  Location: [facility];  Service: Urology;  Laterality: Bilateral;  . CYSTOSCOPY WITH URETHRAL DILATATION N/A 12/29/2013   Procedure: CYSTOSCOPY WITH URETHRAL DILATATION;  Surgeon: Marissa Nestle, MD;  [facility];  Service: Urology;  Laterality: N/A;  . ESOPHAGOGASTRODUODENOSCOPY N/A 04/27/2017   Procedure: ESOPHAGOGASTRODUODENOSCOPY (EGD);  Surgeon: Daneil Dolin, MD;  [facility];  Service: Endoscopy;  Laterality: N/A;  . INSERTION OF DIALYSIS CATHETER Right 11/25/2018   Procedure: INSERTION OF DIALYSIS CATHETER RIGHT INTERNAL JUGULAR;  Surgeon: Waynetta Sandy, MD;  Location: [place];  Service: Vascular;  Laterality: Right;  . IR AV DIALY SHUNT INTRO [place] W/PTA/IMG RIGHT Right 09/07/2018  . IR REMOVAL TUN CV CATH W/O FL  01/12/2019  . IR THROMBECTOMY AV FISTULA W/THROMBOLYSIS/PTA INC/SHUNT/IMG RIGHT Right 04/26/2018  . IR US GUIDE VASC ACCESS RIGHT  04/26/2018  . IR US GUIDE VASC ACCESS RIGHT  09/07/2018  . ORIF FEMUR FRACTURE Right 11/22/2016   Procedure: OPEN REDUCTION INTERNAL FIXATION (ORIF) DISTAL FEMUR FRACTURE;  Surgeon: Rod Can, MD;  Location: [place];  Service: Orthopedics;  Laterality: Right;  . PATCH ANGIOPLASTY Right 12/10/2017   Procedure: PATCH ANGIOPLASTY;  Surgeon: Angelia Mould, MD;  Location: [facility];  Service: Vascular;  Laterality: Right;  . PERIPHERAL VASCULAR BALLOON ANGIOPLASTY  08/13/2018   Procedure: PERIPHERAL VASCULAR BALLOON ANGIOPLASTY;  Surgeon: Elam Dutch, MD;  Location: [place] [facility];  Service: Cardiovascular;;  right AV fistula   . PERIPHERAL VASCULAR BALLOON ANGIOPLASTY  11/22/2018   Procedure: PERIPHERAL VASCULAR BALLOON ANGIOPLASTY;  Surgeon: Waynetta Sandy, MD;  Location: [place] [facility];  Service: Cardiovascular;;  rt AV fistula  . PERIPHERAL VASCULAR CATHETERIZATION N/A 10/08/2015   Procedure: A/V Shuntogram;  Surgeon:  Angelia Mould, MD;  Location: [place] [facility];  Service: Cardiovascular;  Laterality: N/A;  . THROMBECTOMY W/ EMBOLECTOMY Right 12/10/2017   Procedure: THROMBECTOMY REVISION RIGHT ARM  ARTERIOVENOUS FISTULA;  Surgeon: Angelia Mould, MD;  Location: [place];  Service: Vascular;  Laterality: Right;  . TRANSURETHRAL RESECTION OF PROSTATE N/A 01/04/2014   Procedure: TRANSURETHRAL RESECTION OF THE PROSTATE (TURP) (procedure #2);  Surgeon: Marissa Nestle, MD;  [facility];  Service: Urology;  Laterality: N/A;    Social History   Socioeconomic History  . Marital status: Single    Spouse name: Not on file  . Number of children: Not on file  . Years of education: Not on file  . Highest education level: Not on file  Occupational History  . Occupation: retired   Tobacco Use  . Smoking status: Never Smoker  . Smokeless tobacco: Never Used  Vaping Use  . Vaping Use: Never used  Substance and Sexual Activity  . Alcohol use: No  . Drug use: No  . Sexual activity: Not Currently  Other Topics Concern  . 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   Long term resident of [facility]    Social Determinants of Health   Financial Resource Strain:   . Difficulty of Paying Living Expenses: Not on file  Food Insecurity:   . Worried About Charity fundraiser in the Last Year: Not on file  . Ran Out of Food in the Last Year: Not on file  Transportation Needs:   . Lack of Transportation (Medical): Not on file  . Lack of Transportation (Non-Medical): Not on file  Physical Activity: Unknown  . Days of Exercise per Week: Not on file  . Minutes of Exercise per Session: 0 min  Stress:   . Feeling of Stress : Not on file  Social Connections: Unknown  . Frequency of Communication with Friends and Family: Not on file  . Frequency of Social Gatherings with Friends and Family: Never  . Attends Religious Services: Not  on file  . Active Member of Clubs or Organizations: Not on file  . Attends Archivist Meetings: Not on file  . Marital Status: Not on file  Intimate Partner Violence:   . Fear of Current or Ex-Partner: Not on file  . Emotionally Abused: Not on file  . Physically Abused: Not on file  . Sexually Abused: Not on file   Family History  Problem Relation Age of Onset  . Cancer Mother   . Colon cancer Neg Hx       VITAL SIGNS BP (!) 95/59   Pulse 94   Temp (!) 97.1 F (36.2 C)   Resp 18   Ht 5\' 8"  (1.727 m)   Wt 217 lb (98.4 kg)   BMI 32.99 kg/m   Outpatient Encounter Medications as of 09/10/2020  Medication Sig  . Amino Acids-Protein Hydrolys (FEEDING SUPPLEMENT, PRO-STAT 64,) LIQD Take 30 mLs by mouth 2 (two) times daily between meals.  Marland Kitchen atorvastatin (LIPITOR) 80 MG tablet Take 80 mg by mouth every evening.  . insulin aspart (NOVOLOG FLEXPEN) 100 UNIT/ML FlexPen Inject 15 Units into the skin 2 (two) times daily. Hold insulin on dialysis days if cbg's less than 150 on mon,wed,fri  . insulin aspart (NOVOLOG FLEXPEN) 100 UNIT/ML FlexPen Inject 20 Units into the skin daily. If cbg>150; subcutaneous  . Insulin Glargine (BASAGLAR KWIKPEN) 100 UNIT/ML SOPN Inject 45 Units into the skin at bedtime.   Marland Kitchen JANUVIA 25 MG tablet Take 25 mg by mouth daily.   Marland Kitchen levothyroxine (SYNTHROID, LEVOTHROID) 88 MCG tablet Take 88 mcg by mouth daily before breakfast.  . midodrine (PROAMATINE) 10 MG tablet Take 10 mg by mouth. Send med with resident on Mon/Wed/Fri to dialysis. do not give [facility] give to resident to take with him. send with 5mg  to equal 15mg .  . midodrine (PROAMATINE) 5 MG tablet Take 5 mg by mouth. Send with resident on dialysis days.  Mon, Wed, Fri.  Take along with 10 mg to equal 15 mg  . NON FORMULARY 1200cc fluid restrictions. Dietary to provide 400cc/24hrs Nursing to give 7-3=140cc/24hrs 3-11=560cc/24hrs 11-7=100cc/24 Special Instructions: Document amount of fluids  given each shift. Every Shift Day, Evening, Night  . NON FORMULARY Diet Type:  NAS, Cons CHO Diet  . omega-3 acid ethyl esters (LOVAZA) 1 g capsule Take 2 g by mouth at bedtime.  Marland Kitchen omeprazole (PRILOSEC) 40 MG capsule Take 40 mg by mouth daily.   . polyethylene glycol (MIRALAX / GLYCOLAX) packet Take 17 g by mouth daily as needed for mild  constipation.   . sevelamer carbonate (RENVELA) 800 MG tablet Take 1,800 mg by mouth 3 (three) times daily.   . sevelamer carbonate (RENVELA) 800 MG tablet Take 800 mg by mouth daily. With snacks  . tamsulosin (FLOMAX) 0.4 MG CAPS capsule Take 0.4 mg by mouth every evening. Give 30 minutes after a meal  . torsemide (DEMADEX) 10 MG tablet Take 5 tablets (50 mg total) by mouth daily.   No facility-administered encounter medications on file as of 09/10/2020.     SIGNIFICANT DIAGNOSTIC EXAMS   LABS REVIEWED PREVIOUS;  09-07-19: hgb a1c 7.5 09-16-19: wbc 8.0; hgb 16.4; hct 50.6; mcv 107.2 plt 141; glucose 165; bun 40; creat 10.47; k+ 3.7; na++ 135; ca 9.3; liver normal albumin 4.6; stool guaiac neg  12-09-19: hgb a1c 6.9; chol 94; ldl 4 trig 339; hdl 22 tsh 3.202 04-09-20: wbc 6.2; hgb 10.7; hct 33.5; mcv 109.1 plt 144;glucose 185; bun 43; creat 10.75; k+ 4.3; na++ 137; ca 8.7 liver normal albumin 3.7 hgb a1c 7.5 07-04-20: PSA: 2.79  07-19-20: hgb a1c 7.7  NO NEW LABS.     Review of Systems  Constitutional: Negative for malaise/fatigue.  Respiratory: Negative for cough and shortness of breath.   Cardiovascular: Negative for chest pain, palpitations and leg swelling.  Gastrointestinal: Negative for abdominal pain, constipation and heartburn.  Musculoskeletal: Negative for back pain, joint pain and myalgias.  Skin: Negative.   Neurological: Negative for dizziness.  Psychiatric/Behavioral: The patient is not nervous/anxious.      Physical Exam Constitutional:      General: He is not in acute distress.    Appearance: He is well-developed. He is not  diaphoretic.  Neck:     Thyroid: No thyromegaly.  Cardiovascular:     Rate and Rhythm: Normal rate and regular rhythm.     Pulses: Normal pulses.     Heart sounds: Normal heart sounds.  Pulmonary:     Effort: Pulmonary effort is normal. No respiratory distress.     Breath sounds: Normal breath sounds.  Abdominal:     General: Bowel sounds are normal. There is no distension.     Palpations: Abdomen is soft.     Tenderness: There is no abdominal tenderness.  Genitourinary:    Comments: History of turp Musculoskeletal:     Cervical back: Neck supple.     Right lower leg: No edema.     Left lower leg: No edema.     Comments: Is able to move all extremities History of right femur ORIF             Lymphadenopathy:     Cervical: No cervical adenopathy.  Skin:    General: Skin is warm and dry.     Comments: Right upper extremity a/v fistula + thrill       Neurological:     Mental Status: He is alert. Mental status is at baseline.  Psychiatric:        Mood and Affect: Mood normal.     ASSESSMENT/ PLAN:  TODAY  1. Aortic atherosclerosis: (CT 2019); is stable will continue to monitor   2. GERD without esophagitis: is stable will continue prilosec 40 mg daily   3. Malignant neoplasm of colon: is status post colectomy 05-04-17 will monitor      PREVIOUS   4. Dyslipidemia associated with type 2 diabetes mellitus is stable trig 339; will continue lipitor 80 mg daily and lovaza 2 gm daily   5. End stage renal disease on hemodialysis  due to type 2 diabetes mellitus/hemodialysis dependent is stable will continue dialysis three times weekly and is followed by nephrology will continue midodrine 15 mg with dialysis 1200 cc fluid restriction renvela 1600 mg with meals and 800 mg with snacks.  6. Other specified hypothyroidism: is stable tsh 3.202 will continue synthroid 88 mcg daily   7. Anemia of chronic renal disease on chronic dialysis is stable hgb 10.7 will monitor   8. Major  depression single episode moderate: is presently stable is off medications will monitor   9. Type 2 diabetes mellitus with hypertension and end stage renal disease hgb a1c 7.7 cbg at hs are elevated. Will continue januvia 25 mg daily basaglar 40 units nightly  novolog 10 units with lunch and supper and 20 units with supper.   10. Bilateral lower extremity edema: is stable will continue demadex 50 mg daily   11. Orthostatic hypotension is stable b/p 95/59 will continue midodrine 15 mg with dialysis        MD is aware of resident's narcotic use and is in agreement with current plan of care. We will attempt to wean resident as appropriate.  Ok Edwards NP [facility]  Contact [phone] Monday through Friday 8am- 5pm  After hours call [phone]

## 2020-09-10 NOTE — ED Notes (Signed)
Pt stated that he is unable to ambulate or stand for orthostatic vital signs.

## 2020-09-11 ENCOUNTER — Non-Acute Institutional Stay (SKILLED_NURSING_FACILITY): Payer: Medicare Other | Admitting: Adult Health

## 2020-09-11 ENCOUNTER — Encounter: Payer: Self-pay | Admitting: Adult Health

## 2020-09-11 DIAGNOSIS — I951 Orthostatic hypotension: Secondary | ICD-10-CM

## 2020-09-11 DIAGNOSIS — I700 Atherosclerosis of aorta: Secondary | ICD-10-CM | POA: Diagnosis not present

## 2020-09-11 DIAGNOSIS — E1122 Type 2 diabetes mellitus with diabetic chronic kidney disease: Secondary | ICD-10-CM

## 2020-09-11 DIAGNOSIS — I120 Hypertensive chronic kidney disease with stage 5 chronic kidney disease or end stage renal disease: Secondary | ICD-10-CM | POA: Diagnosis not present

## 2020-09-11 DIAGNOSIS — N186 End stage renal disease: Secondary | ICD-10-CM | POA: Diagnosis not present

## 2020-09-11 DIAGNOSIS — Z992 Dependence on renal dialysis: Secondary | ICD-10-CM | POA: Diagnosis not present

## 2020-09-11 NOTE — Progress Notes (Signed)
Location:    [place] Room Number: 112-D Place of Service:  SNF (31)   CODE STATUS: Full Code   Allergies  Allergen Reactions   No Known Allergies     Chief Complaint  Patient presents with   Follow-up    Follow-up from recent ED visit     HPI:  He was taken to the ED from dialysis yesterday after having hypotension with nausea and vomiting. He was given 500 cc NS prior to going the ED. He denies any dizziness no weakness; no nausea or vomiting. He continues with dialysis three days weekly. 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 and end stage renal disease on dialysis   Orthostatic hypotension  Past Medical History:  Diagnosis Date   Abnormal CT scan, kidney 10/06/2011   Acute pyelonephritis 10/07/2011   Anemia    normocytic   Anxiety    mental retardation   Bladder wall thickening 10/06/2011   BPH (benign prostatic hypertrophy)    Diabetes mellitus    Dialysis patient ([facility])    Tuesday, Thursday and Saturday,    DVT of leg (deep venous thrombosis) ([place]) 12/25/2016   Edema     history of lower extremity edema   GERD (gastroesophageal reflux disease)    Heme positive stool    Hydronephrosis    Hyperkalemia    Hyperlipidemia    Hypernatremia    Hypertension    Hypothyroidism    Impaired speech    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HCC)    MR (mental retardation)    Muscle weakness    Obstructive uropathy    Perinephric abscess 10/07/2011   Poor historian poor historian   Protein calorie malnutrition (HCC)    Pyelonephritis    Renal failure (ARF), acute on chronic (HCC)    Renal insufficiency    chronic history   Sepsis ([place])    Smoking    Uremia    Urinary retention    UTI (lower urinary tract infection) 10/06/2011    Past Surgical  History:  Procedure Laterality Date   A/V FISTULAGRAM N/A 08/13/2018   Procedure: A/V FISTULAGRAM - Right Upper;  Surgeon: Elam Dutch, MD;  Location: [place] [facility];  Service: Cardiovascular;  Laterality: N/A;   A/V FISTULAGRAM N/A 11/22/2018   Procedure: A/V FISTULAGRAM - Right Upper;  Surgeon: Waynetta Sandy, MD;  Location: [place] [facility];  Service: Cardiovascular;  Laterality: N/A;   AV FISTULA PLACEMENT Left 07/06/2015   Procedure:  INSERTION LEFT ARM ARTERIOVENOUS GORTEX GRAFT;  Surgeon: Angelia Mould, MD;  Location: [place];  Service: Vascular;  Laterality: Left;   AV FISTULA PLACEMENT Right 02/26/2016   Procedure: ARTERIOVENOUS (AV) FISTULA CREATION ;  Surgeon: Angelia Mould, MD;  Location: [place];  Service: Vascular;  Laterality: Right;   AV FISTULA PLACEMENT Right 11/25/2018   Procedure: INSERTION OF ARTERIOVENOUS (AV) ARTEGRAFT RIGHT UPPER ARM;  Surgeon: Waynetta Sandy, MD;  Location: [place];  Service: Vascular;  Laterality: Right;   [place] REMOVAL Left 10/09/2015   Procedure: REMOVAL OF ARTERIOVENOUS GORETEX GRAFT ([place]) Evacuation of Lymphocele, Vein Patch angioplasty of brachial artery.;  Surgeon: Angelia Mould, MD;  Location: [place];  Service: Vascular;  Laterality: Left;   [place] Right 02/26/2016   Procedure: Right [place];  Surgeon: Angelia Mould,  MD;  Location: [facility];  Service: Vascular;  Laterality: Right;   CIRCUMCISION N/A 01/04/2014   Procedure: CIRCUMCISION ADULT (procedure #1);  Surgeon: Marissa Nestle, MD;  Location: [facility];  Service: Urology;  Laterality: N/A;   COLECTOMY N/A 05/04/2017   Procedure: TOTAL COLECTOMY;  Surgeon: Aviva Signs, MD;  Location: [facility];  Service: General;  Laterality: N/A;   COLONOSCOPY N/A 04/27/2017   Procedure: COLONOSCOPY;  Surgeon: Daneil Dolin, MD;  [facility];  Service: Endoscopy;  Laterality: N/A;  245   CYSTOSCOPY W/  RETROGRADES Bilateral 06/29/2015   Procedure: CYSTOSCOPY, DILATION OF URETHRAL STRICTURE WITH BILATERAL RETROGRADE PYELOGRAM,SUPRAPUBIC TUBE CHANGE;  Surgeon: Festus Aloe, MD;  Location: [facility];  Service: Urology;  Laterality: Bilateral;   CYSTOSCOPY WITH URETHRAL DILATATION N/A 12/29/2013   Procedure: CYSTOSCOPY WITH URETHRAL DILATATION;  Surgeon: Marissa Nestle, MD;  [facility];  Service: Urology;  Laterality: N/A;   ESOPHAGOGASTRODUODENOSCOPY N/A 04/27/2017   Procedure: ESOPHAGOGASTRODUODENOSCOPY (EGD);  Surgeon: Daneil Dolin, MD;  [facility];  Service: Endoscopy;  Laterality: N/A;   INSERTION OF DIALYSIS CATHETER Right 11/25/2018   Procedure: INSERTION OF DIALYSIS CATHETER RIGHT INTERNAL JUGULAR;  Surgeon: Waynetta Sandy, MD;  [facility];  Service: Vascular;  Laterality: Right;   IR AV DIALY SHUNT INTRO NEEDLE/INTRACATH INITIAL W/PTA/IMG RIGHT Right 09/07/2018   IR REMOVAL TUN CV CATH W/O FL  01/12/2019   IR THROMBECTOMY AV FISTULA W/THROMBOLYSIS/PTA INC/SHUNT/IMG RIGHT Right 04/26/2018   IR US GUIDE VASC ACCESS RIGHT  04/26/2018   IR US GUIDE VASC ACCESS RIGHT  09/07/2018   ORIF FEMUR FRACTURE Right 11/22/2016   Procedure: OPEN REDUCTION INTERNAL FIXATION (ORIF) DISTAL FEMUR FRACTURE;  Surgeon: Rod Can, MD;  Location: [place];  Service: Orthopedics;  Laterality: Right;   PATCH ANGIOPLASTY Right 12/10/2017   Procedure: PATCH ANGIOPLASTY;  Surgeon: Angelia Mould, MD;  Location: [place];  Service: Vascular;  Laterality: Right;   PERIPHERAL VASCULAR BALLOON ANGIOPLASTY  08/13/2018   Procedure: PERIPHERAL VASCULAR BALLOON ANGIOPLASTY;  Surgeon: Elam Dutch, MD;  Location: [place] [facility];  Service: Cardiovascular;;  right AV fistula    PERIPHERAL VASCULAR BALLOON ANGIOPLASTY  11/22/2018   Procedure: PERIPHERAL VASCULAR BALLOON ANGIOPLASTY;  Surgeon: Waynetta Sandy, MD;  Location: [place] [facility];  Service:  Cardiovascular;;  rt AV fistula   PERIPHERAL VASCULAR CATHETERIZATION N/A 10/08/2015   Procedure: A/V Shuntogram;  Surgeon: Angelia Mould, MD;  Location: [place] [facility];  Service: Cardiovascular;  Laterality: N/A;   THROMBECTOMY W/ EMBOLECTOMY Right 12/10/2017   Procedure: THROMBECTOMY REVISION RIGHT ARM  ARTERIOVENOUS FISTULA;  Surgeon: Angelia Mould, MD;  Location: [facility] OR;  Service: Vascular;  Laterality: Right;   TRANSURETHRAL RESECTION OF PROSTATE N/A 01/04/2014   Procedure: TRANSURETHRAL RESECTION OF THE PROSTATE (TURP) (procedure #2);  Surgeon: Marissa Nestle, MD;  [facility];  Service: Urology;  Laterality: N/A;    Social History   Socioeconomic History   Marital status: Single    Spouse name: Not on file   Number of children: Not on file   Years of education: Not on file   Highest education level: Not on file  Occupational History   Occupation: retired   Tobacco Use   Smoking status: Never Smoker   Smokeless tobacco: Never Used  Scientific laboratory technician Use: Never used  Substance and Sexual Activity   Alcohol use: No   Drug use: No   Sexual  activity: Not Currently  Other Topics Concern   Not on file  Social History Narrative   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   Long term resident of [facility]    Social Determinants of Health   Financial Resource Strain:    Difficulty of Paying Living Expenses: Not on file  Food Insecurity:    Worried About Charity fundraiser in the Last Year: Not on file   [employer] of Food in the Last Year: Not on file  Transportation Needs:    Lack of Transportation (Medical): Not on file   Lack of Transportation (Non-Medical): Not on file  Physical Activity: Unknown   Days of Exercise per Week: Not on file   Minutes of Exercise per Session: 0 min  Stress:    Feeling of Stress : Not on file  Social Connections: Unknown   Frequency of Communication with Friends  and Family: Not on file   Frequency of Social Gatherings with Friends and Family: Never   Attends Religious Services: Not on Electrical engineer or Organizations: Not on file   Attends Archivist Meetings: Not on file   Marital Status: Not on file  Intimate Partner Violence:    Fear of Current or Ex-Partner: Not on file   Emotionally Abused: Not on file   Physically Abused: Not on file   Sexually Abused: Not on file   Family History  Problem Relation Age of Onset   Cancer Mother    Colon cancer Neg Hx       VITAL SIGNS BP (!) 95/59    Pulse 94    Temp (!) 97.1 F (36.2 C)    Resp 18    Ht 5\' 8"  (1.727 m)    Wt 217 lb (98.4 kg)    SpO2 100%    BMI 32.99 kg/m   Outpatient Encounter Medications as of 09/11/2020  Medication Sig   Amino Acids-Protein Hydrolys (FEEDING SUPPLEMENT, PRO-STAT 64,) LIQD Take 30 mLs by mouth 2 (two) times daily between meals.   atorvastatin (LIPITOR) 80 MG tablet Take 80 mg by mouth every evening.   insulin aspart (NOVOLOG FLEXPEN) 100 UNIT/ML FlexPen Inject 15 Units into the skin 2 (two) times daily. Hold insulin on dialysis days if cbg's less than 150 on mon,wed,fri   insulin aspart (NOVOLOG FLEXPEN) 100 UNIT/ML FlexPen Inject 20 Units into the skin daily. If cbg>150; subcutaneous   Insulin Glargine (BASAGLAR KWIKPEN) 100 UNIT/ML SOPN Inject 45 Units into the skin at bedtime.    JANUVIA 25 MG tablet Take 25 mg by mouth daily.    levothyroxine (SYNTHROID, LEVOTHROID) 88 MCG tablet Take 88 mcg by mouth daily before breakfast.   midodrine (PROAMATINE) 10 MG tablet Take 10 mg by mouth. Send med with resident on Mon/Wed/Fri to dialysis. do not give [facility] give to resident to take with him. send with 5mg  to equal 15mg .   midodrine (PROAMATINE) 5 MG tablet Take 5 mg by mouth. Send with resident on dialysis days.  Mon, Wed, Fri.  Take along with 10 mg to equal 15 mg   NON FORMULARY 1200cc fluid restrictions. Dietary  to provide 400cc/24hrs Nursing to give 7-3=140cc/24hrs 3-11=560cc/24hrs 11-7=100cc/24 Special Instructions: Document amount of fluids given each shift. Every Shift Day, Evening, Night   NON FORMULARY Diet Type:  NAS, Cons CHO Diet   omega-3 acid ethyl esters (LOVAZA) 1 g capsule Take 2 g by mouth at bedtime.   omeprazole (  PRILOSEC) 40 MG capsule Take 40 mg by mouth daily.    polyethylene glycol (MIRALAX / GLYCOLAX) packet Take 17 g by mouth daily as needed for mild constipation.    sevelamer carbonate (RENVELA) 800 MG tablet Take 800 mg by mouth 3 (three) times daily with meals. 9 am, 12 pm, and 5 pm   sevelamer carbonate (RENVELA) 800 MG tablet Take 800 mg by mouth daily. With snacks at 9 pm   tamsulosin (FLOMAX) 0.4 MG CAPS capsule Take 0.4 mg by mouth every evening. Give 30 minutes after a meal   torsemide (DEMADEX) 10 MG tablet Take 5 tablets (50 mg total) by mouth daily.   No facility-administered encounter medications on file as of 09/11/2020.     SIGNIFICANT DIAGNOSTIC EXAMS   LABS REVIEWED PREVIOUS;  09-07-19: hgb a1c 7.5 09-16-19: wbc 8.0; hgb 16.4; hct 50.6; mcv 107.2 plt 141; glucose 165; bun 40; creat 10.47; k+ 3.7; na++ 135; ca 9.3; liver normal albumin 4.6; stool guaiac neg  12-09-19: hgb a1c 6.9; chol 94; ldl 4 trig 339; hdl 22 tsh 3.202 04-09-20: wbc 6.2; hgb 10.7; hct 33.5; mcv 109.1 plt 144;glucose 185; bun 43; creat 10.75; k+ 4.3; na++ 137; ca 8.7 liver normal albumin 3.7 hgb a1c 7.5 07-04-20: PSA: 2.79  07-19-20: hgb a1c 7.7  TODAY  09-10-20: wbc 11.8; hgb 12.9; hct 40.4; mcv 108.6 plt 163; glucose 225; bun 30;creat 8.92; k+ 3.8; na++ 138; ca 9.2    Review of Systems  Constitutional: Negative for malaise/fatigue.  Respiratory: Negative for cough and shortness of breath.   Cardiovascular: Negative for chest pain, palpitations and leg swelling.  Gastrointestinal: Negative for abdominal pain, constipation and heartburn.  Musculoskeletal: Negative for back  pain, joint pain and myalgias.  Skin: Negative.   Neurological: Negative for dizziness.  Psychiatric/Behavioral: The patient is not nervous/anxious.     Physical Exam Constitutional:      General: He is not in acute distress.    Appearance: He is well-developed. He is not diaphoretic.  Neck:     Thyroid: No thyromegaly.  Cardiovascular:     Rate and Rhythm: Normal rate and regular rhythm.     Pulses: Normal pulses.     Heart sounds: Normal heart sounds.  Pulmonary:     Effort: Pulmonary effort is normal. No respiratory distress.     Breath sounds: Normal breath sounds.  Abdominal:     General: Bowel sounds are normal. There is no distension.     Palpations: Abdomen is soft.     Tenderness: There is no abdominal tenderness.  Genitourinary:    Comments:  History of turp Musculoskeletal:     Cervical back: Neck supple.     Right lower leg: No edema.     Left lower leg: No edema.     Comments:  Is able to move all extremities History of right femur ORIF              Lymphadenopathy:     Cervical: No cervical adenopathy.  Skin:    General: Skin is warm and dry.     Comments: Right upper extremity a/v fistula + thrill        Neurological:     Mental Status: He is alert. Mental status is at baseline.  Psychiatric:        Mood and Affect: Mood normal.      ASSESSMENT/ PLAN:  TODAY  1. Aortic atherosclerosis  2. End stage renal disease on dialysis due to type 2  diabetes mellitus 3. Type 2 diabetes mellitus with hypertension and end stage renal disease on dialysis  4. Orthostatic hypotension   Will continue current medications Will continue dialysis three days weekly  Will continue to monitor his status.     MD is aware of resident's narcotic use and is in agreement with current plan of care. We will attempt to wean resident as appropriate.  Ok Edwards NP [facility]  Contact [phone] Monday through Friday 8am- 5pm  After hours call  [phone]

## 2020-09-12 DIAGNOSIS — N2581 Secondary hyperparathyroidism of renal origin: Secondary | ICD-10-CM | POA: Diagnosis not present

## 2020-09-12 DIAGNOSIS — N186 End stage renal disease: Secondary | ICD-10-CM | POA: Diagnosis not present

## 2020-09-12 DIAGNOSIS — Z992 Dependence on renal dialysis: Secondary | ICD-10-CM | POA: Diagnosis not present

## 2020-09-12 DIAGNOSIS — D509 Iron deficiency anemia, unspecified: Secondary | ICD-10-CM | POA: Diagnosis not present

## 2020-09-12 DIAGNOSIS — D631 Anemia in chronic kidney disease: Secondary | ICD-10-CM | POA: Diagnosis not present

## 2020-09-14 DIAGNOSIS — D631 Anemia in chronic kidney disease: Secondary | ICD-10-CM | POA: Diagnosis not present

## 2020-09-14 DIAGNOSIS — Z992 Dependence on renal dialysis: Secondary | ICD-10-CM | POA: Diagnosis not present

## 2020-09-14 DIAGNOSIS — D509 Iron deficiency anemia, unspecified: Secondary | ICD-10-CM | POA: Diagnosis not present

## 2020-09-14 DIAGNOSIS — N186 End stage renal disease: Secondary | ICD-10-CM | POA: Diagnosis not present

## 2020-09-14 DIAGNOSIS — N2581 Secondary hyperparathyroidism of renal origin: Secondary | ICD-10-CM | POA: Diagnosis not present

## 2020-09-17 DIAGNOSIS — Z992 Dependence on renal dialysis: Secondary | ICD-10-CM | POA: Diagnosis not present

## 2020-09-17 DIAGNOSIS — N186 End stage renal disease: Secondary | ICD-10-CM | POA: Diagnosis not present

## 2020-09-17 DIAGNOSIS — D509 Iron deficiency anemia, unspecified: Secondary | ICD-10-CM | POA: Diagnosis not present

## 2020-09-17 DIAGNOSIS — D631 Anemia in chronic kidney disease: Secondary | ICD-10-CM | POA: Diagnosis not present

## 2020-09-17 DIAGNOSIS — N2581 Secondary hyperparathyroidism of renal origin: Secondary | ICD-10-CM | POA: Diagnosis not present

## 2020-09-19 DIAGNOSIS — N2581 Secondary hyperparathyroidism of renal origin: Secondary | ICD-10-CM | POA: Diagnosis not present

## 2020-09-19 DIAGNOSIS — Z992 Dependence on renal dialysis: Secondary | ICD-10-CM | POA: Diagnosis not present

## 2020-09-19 DIAGNOSIS — N186 End stage renal disease: Secondary | ICD-10-CM | POA: Diagnosis not present

## 2020-09-19 DIAGNOSIS — D509 Iron deficiency anemia, unspecified: Secondary | ICD-10-CM | POA: Diagnosis not present

## 2020-09-19 DIAGNOSIS — D631 Anemia in chronic kidney disease: Secondary | ICD-10-CM | POA: Diagnosis not present

## 2020-09-21 DIAGNOSIS — D509 Iron deficiency anemia, unspecified: Secondary | ICD-10-CM | POA: Diagnosis not present

## 2020-09-21 DIAGNOSIS — N2581 Secondary hyperparathyroidism of renal origin: Secondary | ICD-10-CM | POA: Diagnosis not present

## 2020-09-21 DIAGNOSIS — Z992 Dependence on renal dialysis: Secondary | ICD-10-CM | POA: Diagnosis not present

## 2020-09-21 DIAGNOSIS — N186 End stage renal disease: Secondary | ICD-10-CM | POA: Diagnosis not present

## 2020-09-21 DIAGNOSIS — D631 Anemia in chronic kidney disease: Secondary | ICD-10-CM | POA: Diagnosis not present

## 2020-09-24 DIAGNOSIS — N2581 Secondary hyperparathyroidism of renal origin: Secondary | ICD-10-CM | POA: Diagnosis not present

## 2020-09-24 DIAGNOSIS — Z992 Dependence on renal dialysis: Secondary | ICD-10-CM | POA: Diagnosis not present

## 2020-09-24 DIAGNOSIS — D631 Anemia in chronic kidney disease: Secondary | ICD-10-CM | POA: Diagnosis not present

## 2020-09-24 DIAGNOSIS — N186 End stage renal disease: Secondary | ICD-10-CM | POA: Diagnosis not present

## 2020-09-24 DIAGNOSIS — D509 Iron deficiency anemia, unspecified: Secondary | ICD-10-CM | POA: Diagnosis not present

## 2020-09-25 DIAGNOSIS — I120 Hypertensive chronic kidney disease with stage 5 chronic kidney disease or end stage renal disease: Secondary | ICD-10-CM | POA: Diagnosis not present

## 2020-09-25 DIAGNOSIS — Z992 Dependence on renal dialysis: Secondary | ICD-10-CM | POA: Diagnosis not present

## 2020-09-25 DIAGNOSIS — Z1159 Encounter for screening for other viral diseases: Secondary | ICD-10-CM | POA: Diagnosis not present

## 2020-09-25 DIAGNOSIS — N186 End stage renal disease: Secondary | ICD-10-CM | POA: Diagnosis not present

## 2020-09-25 DIAGNOSIS — E1129 Type 2 diabetes mellitus with other diabetic kidney complication: Secondary | ICD-10-CM | POA: Diagnosis not present

## 2020-09-26 DIAGNOSIS — Z992 Dependence on renal dialysis: Secondary | ICD-10-CM | POA: Diagnosis not present

## 2020-09-26 DIAGNOSIS — N2581 Secondary hyperparathyroidism of renal origin: Secondary | ICD-10-CM | POA: Diagnosis not present

## 2020-09-26 DIAGNOSIS — D509 Iron deficiency anemia, unspecified: Secondary | ICD-10-CM | POA: Diagnosis not present

## 2020-09-26 DIAGNOSIS — D631 Anemia in chronic kidney disease: Secondary | ICD-10-CM | POA: Diagnosis not present

## 2020-09-26 DIAGNOSIS — N186 End stage renal disease: Secondary | ICD-10-CM | POA: Diagnosis not present

## 2020-09-27 ENCOUNTER — Encounter: Payer: Self-pay | Admitting: Adult Health

## 2020-09-27 ENCOUNTER — Non-Acute Institutional Stay (SKILLED_NURSING_FACILITY): Payer: Medicare Other | Admitting: Adult Health

## 2020-09-27 DIAGNOSIS — N186 End stage renal disease: Secondary | ICD-10-CM | POA: Diagnosis not present

## 2020-09-27 DIAGNOSIS — C189 Malignant neoplasm of colon, unspecified: Secondary | ICD-10-CM

## 2020-09-27 DIAGNOSIS — I700 Atherosclerosis of aorta: Secondary | ICD-10-CM | POA: Diagnosis not present

## 2020-09-27 DIAGNOSIS — Z992 Dependence on renal dialysis: Secondary | ICD-10-CM

## 2020-09-27 DIAGNOSIS — E1122 Type 2 diabetes mellitus with diabetic chronic kidney disease: Secondary | ICD-10-CM

## 2020-09-27 NOTE — Progress Notes (Signed)
Location:    [place] Room Number: 112/D Place of Service:  SNF (31)   CODE STATUS: Full Code  Allergies  Allergen Reactions  . No Known Allergies     Chief Complaint  Patient presents with  . Acute Visit    Care Plan Meeting    HPI:  We have come together for his care plan meeting. Guardian present. BIMS 7/15 mood 0/30. He requires independent to supervision with his adls. He is occasionally incontinent of urine and is frequently incontinent of bowel. He is able to feed himself. There are no reports of falls. He remains on fluid restriction for dialysis. He has gained weight of 4 pounds over the past month to 221.8 pounds; he has a good appetite. There are no reports of pain present. 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 Abnormal CT scan, kidney 10/06/2011  . Acute pyelonephritis 10/07/2011  . Anemia    normocytic  . Anxiety    mental retardation  . Bladder wall thickening 10/06/2011  . BPH (benign prostatic hypertrophy)   . Diabetes mellitus   . Dialysis patient [facility])    Tuesday, Thursday and Saturday,   . DVT of leg (deep venous thrombosis) ([place]) 12/25/2016  . Edema     history of lower extremity edema  . GERD (gastroesophageal reflux disease)   . Heme positive stool   . Hydronephrosis   . Hyperkalemia   . Hyperlipidemia   . Hypernatremia   . Hypertension   . Hypothyroidism   . Impaired speech   . 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 MR (mental retardation)   . Muscle weakness   . Obstructive uropathy   . 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 Poor historian poor historian  . Protein calorie malnutrition ([place])   . Pyelonephritis   . Renal failure (ARF), acute on chronic (HCC)   . Renal insufficiency    chronic  history  . Sepsis ([place])   . Smoking   . Uremia   . Urinary retention   . UTI (lower urinary tract infection) 10/06/2011    Past Surgical History:  Procedure Laterality Date  . A/V FISTULAGRAM N/A 08/13/2018   Procedure: A/V FISTULAGRAM - Right Upper;  Surgeon: Elam Dutch, MD;  Location: [place] CV LAB;  Service: Cardiovascular;  Laterality: N/A;  . A/V FISTULAGRAM N/A 11/22/2018   Procedure: A/V FISTULAGRAM - Right Upper;  Surgeon: Waynetta Sandy, MD;  Location: [place] CV LAB;  Service: Cardiovascular;  Laterality: N/A;  . AV FISTULA PLACEMENT Left 07/06/2015   Procedure:  INSERTION LEFT ARM ARTERIOVENOUS GORTEX GRAFT;  Surgeon: Angelia Mould, MD;  Location: [place];  Service: Vascular;  Laterality: Left;  . AV FISTULA PLACEMENT Right 02/26/2016   Procedure: ARTERIOVENOUS (AV) FISTULA CREATION ;  Surgeon: Angelia Mould, MD;  Location: [place];  Service: Vascular;  Laterality: Right;  . AV FISTULA PLACEMENT Right 11/25/2018   Procedure: INSERTION OF ARTERIOVENOUS (AV) ARTEGRAFT RIGHT UPPER ARM;  Surgeon: Waynetta Sandy, MD;  Location: [place];  Service: Vascular;  Laterality: Right;  . [place] REMOVAL Left 10/09/2015   Procedure: REMOVAL OF ARTERIOVENOUS GORETEX GRAFT ([place]) Evacuation of Lymphocele, Vein Patch angioplasty of brachial artery.;  Surgeon: Angelia Mould, MD;  [facility];  Service: Vascular;  Laterality: Left;  . BASCILIC VEIN TRANSPOSITION Right 02/26/2016   Procedure: Right BASCILIC VEIN TRANSPOSITION;  Surgeon: Angelia Mould, MD;  Location: [place];  Service: Vascular;  Laterality: Right;  . CIRCUMCISION N/A 01/04/2014   Procedure: CIRCUMCISION ADULT (procedure #1);  Surgeon: Marissa Nestle, MD;  Location: [facility];  Service: Urology;  Laterality: N/A;  . COLECTOMY N/A 05/04/2017   Procedure: TOTAL COLECTOMY;  Surgeon: Aviva Signs, MD;  Location: [facility];  Service: General;  Laterality: N/A;  . COLONOSCOPY N/A 04/27/2017    Procedure: COLONOSCOPY;  Surgeon: Daneil Dolin, MD;  [facility];  Service: Endoscopy;  Laterality: N/A;  245  . CYSTOSCOPY W/ RETROGRADES Bilateral 06/29/2015   Procedure: CYSTOSCOPY, DILATION OF URETHRAL STRICTURE WITH BILATERAL RETROGRADE PYELOGRAM,SUPRAPUBIC TUBE CHANGE;  Surgeon: Festus Aloe, MD;  Location: [facility];  Service: Urology;  Laterality: Bilateral;  . CYSTOSCOPY WITH URETHRAL DILATATION N/A 12/29/2013   Procedure: CYSTOSCOPY WITH URETHRAL DILATATION;  Surgeon: Marissa Nestle, MD;  [facility];  Service: Urology;  Laterality: N/A;  . ESOPHAGOGASTRODUODENOSCOPY N/A 04/27/2017   Procedure: ESOPHAGOGASTRODUODENOSCOPY (EGD);  Surgeon: Daneil Dolin, MD;  [facility];  Service: Endoscopy;  Laterality: N/A;  . INSERTION OF DIALYSIS CATHETER Right 11/25/2018   Procedure: INSERTION OF DIALYSIS CATHETER RIGHT INTERNAL JUGULAR;  Surgeon: Waynetta Sandy, MD;  Location: [place];  Service: Vascular;  Laterality: Right;  . IR AV DIALY SHUNT INTRO [place] W/PTA/IMG RIGHT Right 09/07/2018  . IR REMOVAL TUN CV CATH W/O FL  01/12/2019  . IR THROMBECTOMY AV FISTULA W/THROMBOLYSIS/PTA INC/SHUNT/IMG RIGHT Right 04/26/2018  . IR US GUIDE VASC ACCESS RIGHT  04/26/2018  . IR US GUIDE VASC ACCESS RIGHT  09/07/2018  . ORIF FEMUR FRACTURE Right 11/22/2016   Procedure: OPEN REDUCTION INTERNAL FIXATION (ORIF) DISTAL FEMUR FRACTURE;  Surgeon: Rod Can, MD;  Location: [place];  Service: Orthopedics;  Laterality: Right;  . PATCH ANGIOPLASTY Right 12/10/2017   Procedure: PATCH ANGIOPLASTY;  Surgeon: Angelia Mould, MD;  [facility] OR;  Service: Vascular;  Laterality: Right;  . PERIPHERAL VASCULAR BALLOON ANGIOPLASTY  08/13/2018   Procedure: PERIPHERAL VASCULAR BALLOON ANGIOPLASTY;  Surgeon: Elam Dutch, MD;  Location: [place] CV LAB;  Service: Cardiovascular;;  right AV fistula   . PERIPHERAL VASCULAR BALLOON ANGIOPLASTY  11/22/2018    Procedure: PERIPHERAL VASCULAR BALLOON ANGIOPLASTY;  Surgeon: Waynetta Sandy, MD;  Location: [place] CV LAB;  Service: Cardiovascular;;  rt AV fistula  . PERIPHERAL VASCULAR CATHETERIZATION N/A 10/08/2015   Procedure: A/V Shuntogram;  Surgeon: Angelia Mould, MD;  Location: [place] CV LAB;  Service: Cardiovascular;  Laterality: N/A;  . THROMBECTOMY W/ EMBOLECTOMY Right 12/10/2017   Procedure: THROMBECTOMY REVISION RIGHT ARM  ARTERIOVENOUS FISTULA;  Surgeon: Angelia Mould, MD;  Location: [place];  Service: Vascular;  Laterality: Right;  . TRANSURETHRAL RESECTION OF PROSTATE N/A 01/04/2014   Procedure: TRANSURETHRAL RESECTION OF THE PROSTATE (TURP) (procedure #2);  Surgeon: Marissa Nestle, MD;  [facility];  Service: Urology;  Laterality: N/A;    Social History   Socioeconomic History  . Marital status: Single    Spouse name: Not on file  . Number of children: Not on file  . Years of education: Not on file  . Highest education level: Not on file  Occupational History  . Occupation: retired   Tobacco Use  . Smoking status: Never Smoker  . Smokeless tobacco: Never  Used  Vaping Use  . Vaping Use: Never used  Substance and Sexual Activity  . Alcohol use: No  . Drug use: No  . Sexual activity: Not Currently  Other Topics Concern  . 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   Long term resident of [facility]    Social Determinants of Health   Financial Resource Strain:   . Difficulty of Paying Living Expenses: Not on file  Food Insecurity:   . Worried About Charity fundraiser in the Last Year: Not on file  . Ran Out of Food in the Last Year: Not on file  Transportation Needs:   . Lack of Transportation (Medical): Not on file  . Lack of Transportation (Non-Medical): Not on file  Physical Activity:   . Days of Exercise per Week: Not on file  . Minutes of Exercise per Session:  Not on file  Stress:   . Feeling of Stress : Not on file  Social Connections:   . Frequency of Communication with Friends and Family: Not on file  . Frequency of Social Gatherings with Friends and Family: Not on file  . Attends Religious Services: Not on file  . Active Member of Clubs or Organizations: Not on file  . Attends Archivist Meetings: Not on file  . Marital Status: Not on file  Intimate Partner Violence:   . Fear of Current or Ex-Partner: Not on file  . Emotionally Abused: Not on file  . Physically Abused: Not on file  . Sexually Abused: Not on file   Family History  Problem Relation Age of Onset  . Cancer Mother   . Colon cancer Neg Hx       VITAL SIGNS BP (!) 102/56   Pulse 68   Temp 97.7 F (36.5 C)   Resp 20   Ht 5\' 8"  (1.727 m)   Wt 221 lb 12.8 oz (100.6 kg)   SpO2 100%   BMI 33.72 kg/m   Outpatient Encounter Medications as of 09/27/2020  Medication Sig  . Amino Acids-Protein Hydrolys (FEEDING SUPPLEMENT, PRO-STAT 64,) LIQD Take 30 mLs by mouth 2 (two) times daily between meals.  Marland Kitchen atorvastatin (LIPITOR) 80 MG tablet Take 80 mg by mouth every evening.  . insulin aspart (NOVOLOG FLEXPEN) 100 UNIT/ML FlexPen Inject 15 Units into the skin 2 (two) times daily. Hold insulin on dialysis days if cbg's less than 150 on mon,wed,fri  . insulin aspart (NOVOLOG FLEXPEN) 100 UNIT/ML FlexPen Inject 20 Units into the skin daily. If cbg>150; subcutaneous  . Insulin Glargine (BASAGLAR KWIKPEN) 100 UNIT/ML SOPN Inject 45 Units into the skin at bedtime.   Marland Kitchen JANUVIA 25 MG tablet Take 25 mg by mouth daily.   Marland Kitchen levothyroxine (SYNTHROID, LEVOTHROID) 88 MCG tablet Take 88 mcg by mouth daily before breakfast.  . midodrine (PROAMATINE) 10 MG tablet Take 10 mg by mouth. Send med with resident on Mon/Wed/Fri to dialysis. do not give [facility] give to resident to take with him. send with 5mg  to equal 15mg .  . midodrine (PROAMATINE) 5 MG tablet Take 5 mg by mouth. Send  with resident on dialysis days.  Mon, Wed, Fri.  Take along with 10 mg to equal 15 mg  . NON FORMULARY 1200cc fluid restrictions. Dietary to provide 400cc/24hrs Nursing to give 7-3=140cc/24hrs 3-11=560cc/24hrs 11-7=100cc/24 Special Instructions: Document amount of fluids given each shift. Every Shift Day, Evening, Night  . NON FORMULARY Diet Type:  NAS, Cons CHO Diet  . omega-3 acid ethyl esters (LOVAZA) 1 g capsule Take 2 g by mouth at bedtime.  Marland Kitchen omeprazole (PRILOSEC) 40 MG capsule Take 40 mg by mouth daily.   . polyethylene glycol (MIRALAX / GLYCOLAX) packet Take 17 g by mouth daily as needed for mild constipation.   . sevelamer carbonate (RENVELA) 800 MG tablet Take 800 mg by mouth 3 (three) times daily with meals. 9 am, 12 pm, and 5 pm  . sevelamer carbonate (RENVELA) 800 MG tablet Take 800 mg by mouth every Monday, Wednesday, and Friday.   . tamsulosin (FLOMAX) 0.4 MG CAPS capsule Take 0.4 mg by mouth every evening. Give 30 minutes after a meal  . torsemide (DEMADEX) 10 MG tablet Take 5 tablets (50 mg total) by mouth daily.   No facility-administered encounter medications on file as of 09/27/2020.     SIGNIFICANT DIAGNOSTIC EXAMS   LABS REVIEWED PREVIOUS;  12-09-19: hgb a1c 6.9; chol 94; ldl 4 trig 339; hdl 22 tsh 3.202 04-09-20: wbc 6.2; hgb 10.7; hct 33.5; mcv 109.1 plt 144;glucose 185; bun 43; creat 10.75; k+ 4.3; na++ 137; ca 8.7 liver normal albumin 3.7 hgb a1c 7.5 07-04-20: PSA: 2.79  07-19-20: hgb a1c 7.7 09-10-20: wbc 11.8; hgb 12.9; hct 40.4; mcv 108.6 plt 163; glucose 225; bun 30;creat 8.92; k+ 3.8; na++ 138; ca 9.2  NO NEW LABS.     Review of Systems  Constitutional: Negative for malaise/fatigue.  Respiratory: Negative for cough and shortness of breath.   Cardiovascular: Negative for chest pain, palpitations and leg swelling.  Gastrointestinal: Negative for abdominal pain, constipation and heartburn.  Musculoskeletal: Negative for back pain, joint pain and myalgias.   Skin: Negative.   Neurological: Negative for dizziness.  Psychiatric/Behavioral: The patient is not nervous/anxious.     Physical Exam Constitutional:      General: He is not in acute distress.    Appearance: He is well-developed. He is not diaphoretic.  Neck:     Thyroid: No thyromegaly.  Cardiovascular:     Rate and Rhythm: Normal rate and regular rhythm.     Pulses: Normal pulses.     Heart sounds: Normal heart sounds.  Pulmonary:     Effort: Pulmonary effort is normal. No respiratory distress.     Breath sounds: Normal breath sounds.  Abdominal:     General: Bowel sounds are normal. There is no distension.     Palpations: Abdomen is soft.     Tenderness: There is no abdominal tenderness.  Genitourinary:    Comments:  History of turp Musculoskeletal:     Cervical back: Neck supple.     Right lower leg: No edema.     Left lower leg: No edema.     Comments: Is able to move all extremities History of right femur ORIF               Lymphadenopathy:     Cervical: No cervical adenopathy.  Skin:    General: Skin is warm and dry.     Comments: Right upper extremity a/v fistula + thrill         Neurological:     Mental Status: He is alert. Mental status is at baseline.  Psychiatric:        Mood and Affect: Mood normal.      ASSESSMENT/ PLAN:  TODAY  1. Aortic atherosclerosis 2. Malignant neoplasm of colon unspecified part of colon 3. End stage renal disease on dialysis due to type 2  diabetes mellitus:   Will continue current medications Will continue current plan of care Will continue to monitor his stats.   MD is aware of resident's narcotic use and is in agreement with current plan of care. We will attempt to wean resident as appropriate.  Ok Edwards NP [facility]  Contact [phone] Monday through Friday 8am- 5pm  After hours call [phone]

## 2020-09-28 DIAGNOSIS — D509 Iron deficiency anemia, unspecified: Secondary | ICD-10-CM | POA: Diagnosis not present

## 2020-09-28 DIAGNOSIS — D631 Anemia in chronic kidney disease: Secondary | ICD-10-CM | POA: Diagnosis not present

## 2020-09-28 DIAGNOSIS — Z992 Dependence on renal dialysis: Secondary | ICD-10-CM | POA: Diagnosis not present

## 2020-09-28 DIAGNOSIS — N186 End stage renal disease: Secondary | ICD-10-CM | POA: Diagnosis not present

## 2020-09-28 DIAGNOSIS — N2581 Secondary hyperparathyroidism of renal origin: Secondary | ICD-10-CM | POA: Diagnosis not present

## 2020-10-01 DIAGNOSIS — Z992 Dependence on renal dialysis: Secondary | ICD-10-CM | POA: Diagnosis not present

## 2020-10-01 DIAGNOSIS — D509 Iron deficiency anemia, unspecified: Secondary | ICD-10-CM | POA: Diagnosis not present

## 2020-10-01 DIAGNOSIS — N2581 Secondary hyperparathyroidism of renal origin: Secondary | ICD-10-CM | POA: Diagnosis not present

## 2020-10-01 DIAGNOSIS — D631 Anemia in chronic kidney disease: Secondary | ICD-10-CM | POA: Diagnosis not present

## 2020-10-01 DIAGNOSIS — N186 End stage renal disease: Secondary | ICD-10-CM | POA: Diagnosis not present

## 2020-10-03 DIAGNOSIS — D631 Anemia in chronic kidney disease: Secondary | ICD-10-CM | POA: Diagnosis not present

## 2020-10-03 DIAGNOSIS — N2581 Secondary hyperparathyroidism of renal origin: Secondary | ICD-10-CM | POA: Diagnosis not present

## 2020-10-03 DIAGNOSIS — D509 Iron deficiency anemia, unspecified: Secondary | ICD-10-CM | POA: Diagnosis not present

## 2020-10-03 DIAGNOSIS — N186 End stage renal disease: Secondary | ICD-10-CM | POA: Diagnosis not present

## 2020-10-03 DIAGNOSIS — Z992 Dependence on renal dialysis: Secondary | ICD-10-CM | POA: Diagnosis not present

## 2020-10-05 DIAGNOSIS — Z992 Dependence on renal dialysis: Secondary | ICD-10-CM | POA: Diagnosis not present

## 2020-10-05 DIAGNOSIS — D631 Anemia in chronic kidney disease: Secondary | ICD-10-CM | POA: Diagnosis not present

## 2020-10-05 DIAGNOSIS — N2581 Secondary hyperparathyroidism of renal origin: Secondary | ICD-10-CM | POA: Diagnosis not present

## 2020-10-05 DIAGNOSIS — N186 End stage renal disease: Secondary | ICD-10-CM | POA: Diagnosis not present

## 2020-10-05 DIAGNOSIS — D509 Iron deficiency anemia, unspecified: Secondary | ICD-10-CM | POA: Diagnosis not present

## 2020-10-08 ENCOUNTER — Encounter: Payer: Self-pay | Admitting: Adult Health

## 2020-10-08 ENCOUNTER — Non-Acute Institutional Stay (SKILLED_NURSING_FACILITY): Payer: Medicare Other | Admitting: Adult Health

## 2020-10-08 DIAGNOSIS — Z992 Dependence on renal dialysis: Secondary | ICD-10-CM | POA: Diagnosis not present

## 2020-10-08 DIAGNOSIS — E1122 Type 2 diabetes mellitus with diabetic chronic kidney disease: Secondary | ICD-10-CM

## 2020-10-08 DIAGNOSIS — E785 Hyperlipidemia, unspecified: Secondary | ICD-10-CM

## 2020-10-08 DIAGNOSIS — D631 Anemia in chronic kidney disease: Secondary | ICD-10-CM | POA: Diagnosis not present

## 2020-10-08 DIAGNOSIS — E034 Atrophy of thyroid (acquired): Secondary | ICD-10-CM

## 2020-10-08 DIAGNOSIS — E1169 Type 2 diabetes mellitus with other specified complication: Secondary | ICD-10-CM | POA: Diagnosis not present

## 2020-10-08 DIAGNOSIS — N2581 Secondary hyperparathyroidism of renal origin: Secondary | ICD-10-CM | POA: Diagnosis not present

## 2020-10-08 DIAGNOSIS — N186 End stage renal disease: Secondary | ICD-10-CM

## 2020-10-08 DIAGNOSIS — D509 Iron deficiency anemia, unspecified: Secondary | ICD-10-CM | POA: Diagnosis not present

## 2020-10-08 NOTE — Progress Notes (Signed)
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 There are no reports of uncontrolled pain; no excessive sleeping; no changes in appetite; no anxiety.   Past Medical History:  Diagnosis Date   Abnormal CT scan, kidney 10/06/2011   Acute pyelonephritis 10/07/2011   Anemia    normocytic   Anxiety    mental retardation   Bladder wall thickening 10/06/2011   BPH (benign prostatic hypertrophy)    Diabetes mellitus    Dialysis patient ([place])    Tuesday, Thursday and Saturday,    DVT of leg (deep venous thrombosis) ([place]) 12/25/2016   Edema     history of lower extremity edema   GERD (gastroesophageal reflux disease)    Heme positive stool    Hydronephrosis    Hyperkalemia    Hyperlipidemia    Hypernatremia    Hypertension    Hypothyroidism    Impaired speech    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[place])    MR (mental retardation)    Muscle weakness    Obstructive uropathy    Perinephric abscess 10/07/2011   Poor historian poor historian   Protein calorie malnutrition ([place])    Pyelonephritis    Renal failure (ARF), acute on chronic (HCC)    Renal insufficiency    chronic history   Sepsis ([place])    Smoking    Uremia    Urinary retention    UTI  (lower urinary tract infection) 10/06/2011    Past Surgical History:  Procedure Laterality Date   A/V FISTULAGRAM N/A 08/13/2018   Procedure: A/V FISTULAGRAM - Right Upper;  Surgeon: Elam Dutch, MD;  Location: [place] [facility];  Service: Cardiovascular;  Laterality: N/A;   A/V FISTULAGRAM N/A 11/22/2018   Procedure: A/V FISTULAGRAM - Right Upper;  Surgeon: Waynetta Sandy, MD;  Location: [place] [facility];  Service: Cardiovascular;  Laterality: N/A;   AV FISTULA PLACEMENT Left 07/06/2015   Procedure:  INSERTION LEFT ARM ARTERIOVENOUS GORTEX GRAFT;  Surgeon: Angelia Mould, MD;  Location: [place];  Service: Vascular;  Laterality: Left;   AV FISTULA PLACEMENT Right 02/26/2016   Procedure: ARTERIOVENOUS (AV) FISTULA CREATION ;  Surgeon: Angelia Mould, MD;  Location: [place];  Service: Vascular;  Laterality: Right;   AV FISTULA PLACEMENT Right 11/25/2018   Procedure: INSERTION OF ARTERIOVENOUS (AV) ARTEGRAFT RIGHT UPPER ARM;  Surgeon: Waynetta Sandy, MD;  Location: [place];  Service: Vascular;  Laterality: Right;   [place] REMOVAL Left 10/09/2015   Procedure: REMOVAL OF ARTERIOVENOUS GORETEX GRAFT ([place]) Evacuation of Lymphocele, Vein Patch angioplasty of brachial artery.;  Surgeon: Angelia Mould, MD;  Location: [place];  Service: Vascular;  Laterality: Left;   [place] Right 02/26/2016   Procedure: Right [place];  Surgeon:  Angelia Mould, MD;  Location: [facility];  Service: Vascular;  Laterality: Right;   CIRCUMCISION N/A 01/04/2014   Procedure: CIRCUMCISION ADULT (procedure #1);  Surgeon: Marissa Nestle, MD;  [facility];  Service: Urology;  Laterality: N/A;   COLECTOMY N/A 05/04/2017   Procedure: TOTAL COLECTOMY;  Surgeon: Aviva Signs, MD;  Location[facility];  Service: General;  Laterality: N/A;   COLONOSCOPY N/A 04/27/2017   Procedure: COLONOSCOPY;  Surgeon: Daneil Dolin, MD;  [facility];  Service: Endoscopy;  Laterality: N/A;  245   CYSTOSCOPY W/ RETROGRADES Bilateral 06/29/2015   Procedure: CYSTOSCOPY, DILATION OF URETHRAL STRICTURE WITH BILATERAL RETROGRADE PYELOGRAM,SUPRAPUBIC TUBE CHANGE;  Surgeon: Festus Aloe, MD;  Location: [facility];  Service: Urology;  Laterality: Bilateral;   CYSTOSCOPY WITH URETHRAL DILATATION N/A 12/29/2013   Procedure: CYSTOSCOPY WITH URETHRAL DILATATION;  Surgeon: Marissa Nestle, MD;  [facility];  Service: Urology;  Laterality: N/A;   ESOPHAGOGASTRODUODENOSCOPY N/A 04/27/2017   Procedure: ESOPHAGOGASTRODUODENOSCOPY (EGD);  Surgeon: Daneil Dolin, MD;  [facility];  Service: Endoscopy;  Laterality: N/A;   INSERTION OF DIALYSIS CATHETER Right 11/25/2018   Procedure: INSERTION OF DIALYSIS CATHETER RIGHT INTERNAL JUGULAR;  Surgeon: Waynetta Sandy, MD;  Location: [facility] OR;  Service: Vascular;  Laterality: Right;   IR AV DIALY SHUNT INTRO NEEDLE/INTRACATH INITIAL W/PTA/IMG RIGHT Right 09/07/2018   IR REMOVAL TUN CV CATH W/O FL  01/12/2019   IR THROMBECTOMY AV FISTULA W/THROMBOLYSIS/PTA INC/SHUNT/IMG RIGHT Right 04/26/2018   IR US GUIDE VASC ACCESS RIGHT  04/26/2018   IR US GUIDE VASC ACCESS RIGHT  09/07/2018   ORIF FEMUR FRACTURE Right 11/22/2016   Procedure: OPEN REDUCTION INTERNAL FIXATION (ORIF) DISTAL FEMUR FRACTURE;  Surgeon: Rod Can, MD;  Location: [place];  Service: Orthopedics;  Laterality: Right;   PATCH ANGIOPLASTY Right 12/10/2017   Procedure: PATCH ANGIOPLASTY;  Surgeon: Angelia Mould, MD;  Location: [place];  Service: Vascular;  Laterality: Right;   PERIPHERAL VASCULAR BALLOON ANGIOPLASTY  08/13/2018   Procedure: PERIPHERAL VASCULAR BALLOON ANGIOPLASTY;  Surgeon: Elam Dutch, MD;  Location: [place] [facility];  Service: Cardiovascular;;  right AV fistula    PERIPHERAL VASCULAR BALLOON ANGIOPLASTY  11/22/2018   Procedure: PERIPHERAL VASCULAR BALLOON ANGIOPLASTY;  Surgeon: Waynetta Sandy, MD;  Location: [place] [facility];  Service: Cardiovascular;;  rt AV fistula   PERIPHERAL VASCULAR CATHETERIZATION N/A 10/08/2015   Procedure: A/V [place];  Surgeon: Angelia Mould, MD;  Location: [place] [facility];  Service: Cardiovascular;  Laterality: N/A;   THROMBECTOMY W/ EMBOLECTOMY Right 12/10/2017   Procedure: THROMBECTOMY REVISION RIGHT ARM  ARTERIOVENOUS FISTULA;  Surgeon: Angelia Mould, MD;  Location: [facility] OR;  Service: Vascular;  Laterality: Right;   TRANSURETHRAL RESECTION OF PROSTATE N/A 01/04/2014   Procedure: TRANSURETHRAL RESECTION OF THE PROSTATE (TURP) (procedure #2);  Surgeon: Marissa Nestle, MD;  [facility];  Service: Urology;  Laterality: N/A;    Social History   Socioeconomic History   Marital status: Single    Spouse name: Not on file   Number of children: Not on file   Years of education: Not on file   Highest education level: Not on file  Occupational History   Occupation: retired   Tobacco Use   Smoking status: Never Smoker   Smokeless tobacco: Never Used  Scientific laboratory technician Use: Never used  Substance and Sexual Activity   Alcohol use: No   Drug use: No  Sexual activity: Not Currently  Other Topics Concern   Not on file  Social History Narrative   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   [facility]    Social Determinants of Health   Financial Resource Strain: Not on file  Food Insecurity: Not on file  Transportation Needs: Not on file  Physical Activity: Not on file  Stress: Not on file  Social Connections: Not on file  Intimate Partner Violence: Not on file   Family History  Problem Relation Age of Onset   Cancer Mother    Colon cancer Neg Hx       VITAL SIGNS BP (!) 100/56    Pulse 90    Temp 98.1 F (36.7 C)    Resp 18    Ht 5\' 8"  (1.727 m)    Wt 221 lb 12.8 oz (100.6 kg)    BMI 33.72 kg/m   Outpatient Encounter Medications  as of 10/08/2020  Medication Sig   Amino Acids-Protein Hydrolys (FEEDING SUPPLEMENT, PRO-STAT 64,) LIQD Take 30 mLs by mouth 2 (two) times daily between meals.   atorvastatin (LIPITOR) 80 MG tablet Take 80 mg by mouth every evening.   insulin aspart (NOVOLOG FLEXPEN) 100 UNIT/ML FlexPen Inject 20 Units into the skin daily. If cbg>150; subcutaneous   insulin aspart (NOVOLOG) 100 UNIT/ML FlexPen Inject 15 Units into the skin 2 (two) times daily. Hold insulin on dialysis days if cbg's less than 150 on mon,wed,fri   Insulin Glargine (BASAGLAR KWIKPEN) 100 UNIT/ML SOPN Inject 45 Units into the skin at bedtime.    JANUVIA 25 MG tablet Take 25 mg by mouth daily.    levothyroxine (SYNTHROID, LEVOTHROID) 88 MCG tablet Take 88 mcg by mouth daily before breakfast.   midodrine (PROAMATINE) 10 MG tablet Take 10 mg by mouth. Send med with resident on Mon/Wed/Fri to dialysis. do not give [facility] give to resident to take with him. send with 5mg  to equal 15mg .   midodrine (PROAMATINE) 5 MG tablet Take 5 mg by mouth. Send with resident on dialysis days.  Mon, Wed, Fri.  Take along with 10 mg to equal 15 mg   NON FORMULARY 1200cc fluid restrictions. Dietary to provide 400cc/24hrs Nursing to give 7-3=140cc/24hrs 3-11=560cc/24hrs 11-7=100cc/24 Special Instructions: Document amount of fluids given each shift. Every Shift Day, Evening, Night   NON FORMULARY Diet Type:  NAS, Cons CHO Diet   omega-3 acid ethyl esters (LOVAZA) 1 g capsule Take 2 g by mouth at bedtime.   omeprazole (PRILOSEC) 40 MG capsule Take 40 mg by mouth daily.    polyethylene glycol (MIRALAX / GLYCOLAX) packet Take 17 g by mouth daily as needed for mild constipation.    sevelamer carbonate (RENVELA) 800 MG tablet Take 800 mg by mouth 3 (three) times daily with meals. 9 am, 12 pm, and 5 pm   sevelamer carbonate (RENVELA) 800 MG tablet Take 800 mg by mouth every Monday, Wednesday, and Friday.    tamsulosin (FLOMAX) 0.4 MG CAPS  capsule Take 0.4 mg by mouth every evening. Give 30 minutes after a meal   torsemide (DEMADEX) 10 MG tablet Take 5 tablets (50 mg total) by mouth daily.   No facility-administered encounter medications on file as of 10/08/2020.     SIGNIFICANT DIAGNOSTIC EXAMS   LABS REVIEWED PREVIOUS;  12-09-19: hgb a1c 6.9; chol 94; ldl 4 trig 339; hdl 22 tsh 3.202 04-09-20: wbc 6.2; hgb 10.7; hct 33.5; mcv 109.1 plt 144;glucose 185; bun  43; creat 10.75; k+ 4.3; na++ 137; ca 8.7 liver normal albumin 3.7 hgb a1c 7.5 07-04-20: PSA: 2.79  07-19-20: hgb a1c 7.7 09-10-20: wbc 11.8; hgb 12.9; hct 40.4; mcv 108.6 plt 163; glucose 225; bun 30;creat 8.92; k+ 3.8; na++ 138; ca 9.2  NO NEW LABS.     Review of Systems  Constitutional: Negative for malaise/fatigue.  Respiratory: Negative for cough and shortness of breath.   Cardiovascular: Negative for chest pain, palpitations and leg swelling.  Gastrointestinal: Negative for abdominal pain, constipation and heartburn.  Musculoskeletal: Negative for back pain, joint pain and myalgias.  Skin: Negative.   Neurological: Negative for dizziness.  Psychiatric/Behavioral: The patient is not nervous/anxious.       Physical Exam Constitutional:      General: He is not in acute distress.    Appearance: He is well-developed and well-nourished. He is not diaphoretic.  Neck:     Thyroid: No thyromegaly.  Cardiovascular:     Rate and Rhythm: Normal rate and regular rhythm.     Pulses: Normal pulses and intact distal pulses.     Heart sounds: Normal heart sounds.  Pulmonary:     Effort: Pulmonary effort is normal. No respiratory distress.     Breath sounds: Normal breath sounds.  Abdominal:     General: Bowel sounds are normal. There is no distension.     Palpations: Abdomen is soft.     Tenderness: There is no abdominal tenderness.  Genitourinary:    Comments: History of turp Musculoskeletal:        General: No edema.     Cervical back: Neck supple.      Right lower leg: No edema.     Left lower leg: No edema.     Comments:  Is able to move all extremities History of right femur ORIF                Lymphadenopathy:     Cervical: No cervical adenopathy.  Skin:    General: Skin is warm and dry.     Comments: Right upper extremity a/v fistula + thrill          Neurological:     Mental Status: He is alert. Mental status is at baseline.  Psychiatric:        Mood and Affect: Mood and affect and mood normal.        ASSESSMENT/ PLAN:  TODAY  1. Dyslipidemia associated with type 2 diabetes mellitus is stable trig 339; will continue lipitor 80 mg daily and lovaza 2 gm daily   2. End stage renal disease on hemodialysis due to type 2 diabetes mellitus:/hemodialysis dependent: is stable will continue dialysis three days per week; is followed by nephrology; will continue midodrine 15 mg with dialysis  1200 cc fluid restriction renvela 1600 mg with meals and 800 mg with snacks.   3. Other specified hypothyroidism: is stable tsh 3.202 will continue synthroid 88 mcg daily   PREVIOUS   4. 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 Major depression single episode moderate: is presently stable is off medications will monitor   6. Type 2 diabetes mellitus with hypertension and end stage renal disease hgb a1c 7.7 cbg at hs are elevated. Will continue januvia 25 mg daily basaglar 40 units nightly  novolog 10 units with lunch and supper and 20 units with supper.   7. Bilateral lower extremity edema: is stable will continue demadex 50 mg  daily   8. Orthostatic hypotension is stable b/p 100/56 will continue midodrine 15 mg with dialysis   9. Aortic atherosclerosis: (CT 2019); is stable will continue to monitor   10. GERD without esophagitis: is stable will continue prilosec 40 mg daily   11. Malignant neoplasm of colon: is status post colectomy 05-04-17 will monitor    Will check hgb a1c lipids     MD is aware of resident's narcotic use and is in agreement with current plan of care. We will attempt to wean resident as appropriate.  Ok Edwards NP [facility]  Contact [phone] Monday through Friday 8am- 5pm  After hours call [phone]

## 2020-10-10 DIAGNOSIS — N2581 Secondary hyperparathyroidism of renal origin: Secondary | ICD-10-CM | POA: Diagnosis not present

## 2020-10-10 DIAGNOSIS — D631 Anemia in chronic kidney disease: Secondary | ICD-10-CM | POA: Diagnosis not present

## 2020-10-10 DIAGNOSIS — N186 End stage renal disease: Secondary | ICD-10-CM | POA: Diagnosis not present

## 2020-10-10 DIAGNOSIS — D509 Iron deficiency anemia, unspecified: Secondary | ICD-10-CM | POA: Diagnosis not present

## 2020-10-10 DIAGNOSIS — Z992 Dependence on renal dialysis: Secondary | ICD-10-CM | POA: Diagnosis not present

## 2020-10-12 ENCOUNTER — Other Ambulatory Visit (HOSPITAL_COMMUNITY)
Admission: RE | Admit: 2020-10-12 | Discharge: 2020-10-12 | Disposition: A | Discharge status: Home / Self Care | Payer: Medicare Other | Source: Skilled Nursing Facility | Attending: Adult Health | Admitting: Adult Health

## 2020-10-12 DIAGNOSIS — E1129 Type 2 diabetes mellitus with other diabetic kidney complication: Secondary | ICD-10-CM | POA: Insufficient documentation

## 2020-10-12 DIAGNOSIS — Z992 Dependence on renal dialysis: Secondary | ICD-10-CM | POA: Diagnosis not present

## 2020-10-12 DIAGNOSIS — N2581 Secondary hyperparathyroidism of renal origin: Secondary | ICD-10-CM | POA: Diagnosis not present

## 2020-10-12 DIAGNOSIS — D509 Iron deficiency anemia, unspecified: Secondary | ICD-10-CM | POA: Diagnosis not present

## 2020-10-12 DIAGNOSIS — N186 End stage renal disease: Secondary | ICD-10-CM | POA: Diagnosis not present

## 2020-10-12 DIAGNOSIS — D631 Anemia in chronic kidney disease: Secondary | ICD-10-CM | POA: Diagnosis not present

## 2020-10-12 LAB — LIPID PANEL
Cholesterol: 76 mg/dL (ref 0–200)
HDL: 24 mg/dL — ABNORMAL LOW (ref >40)
Total CHOL/HDL Ratio: 3.2 RATIO
Triglycerides: 319 mg/dL — ABNORMAL HIGH (ref <150)
VLDL: 64 mg/dL — ABNORMAL HIGH (ref 0–40)

## 2020-10-12 LAB — HEMOGLOBIN A1C
Hgb A1c MFr Bld: 7.4 % — ABNORMAL HIGH (ref 4.8–5.6)
Mean Plasma Glucose: 165.68 mg/dL

## 2020-10-15 ENCOUNTER — Other Ambulatory Visit: Payer: Self-pay | Admitting: Radiology

## 2020-10-15 ENCOUNTER — Other Ambulatory Visit: Payer: Self-pay | Admitting: Student

## 2020-10-16 ENCOUNTER — Other Ambulatory Visit: Payer: Self-pay | Admitting: Medical

## 2020-10-16 ENCOUNTER — Other Ambulatory Visit: Payer: Self-pay

## 2020-10-16 ENCOUNTER — Ambulatory Visit (HOSPITAL_COMMUNITY)
Admission: RE | Admit: 2020-10-16 | Discharge: 2020-10-16 | Disposition: A | Discharge status: Home / Self Care | Payer: Medicare Other | Source: Ambulatory Visit | Attending: Medical | Admitting: Medical

## 2020-10-16 DIAGNOSIS — Z992 Dependence on renal dialysis: Secondary | ICD-10-CM

## 2020-10-16 DIAGNOSIS — Z79899 Other long term (current) drug therapy: Secondary | ICD-10-CM | POA: Insufficient documentation

## 2020-10-16 DIAGNOSIS — T82868A Thrombosis of vascular prosthetic devices, implants and grafts, initial encounter: Secondary | ICD-10-CM | POA: Diagnosis not present

## 2020-10-16 DIAGNOSIS — E1122 Type 2 diabetes mellitus with diabetic chronic kidney disease: Secondary | ICD-10-CM | POA: Insufficient documentation

## 2020-10-16 DIAGNOSIS — N186 End stage renal disease: Secondary | ICD-10-CM

## 2020-10-16 DIAGNOSIS — Z79890 Hormone replacement therapy (postmenopausal): Secondary | ICD-10-CM | POA: Insufficient documentation

## 2020-10-16 DIAGNOSIS — Y841 Kidney dialysis as the cause of abnormal reaction of the patient, or of later complication, without mention of misadventure at the time of the procedure: Secondary | ICD-10-CM | POA: Insufficient documentation

## 2020-10-16 DIAGNOSIS — Z4901 Encounter for fitting and adjustment of extracorporeal dialysis catheter: Secondary | ICD-10-CM | POA: Diagnosis not present

## 2020-10-16 DIAGNOSIS — Z794 Long term (current) use of insulin: Secondary | ICD-10-CM | POA: Insufficient documentation

## 2020-10-16 HISTORY — PX: IR US GUIDE VASC ACCESS RIGHT: IMG2390

## 2020-10-16 HISTORY — PX: IR FLUORO GUIDE CV LINE RIGHT: IMG2283

## 2020-10-16 LAB — GLUCOSE, CAPILLARY
Glucose-Capillary: 131 mg/dL — ABNORMAL HIGH (ref 70–99)
Glucose-Capillary: 113 mg/dL — ABNORMAL HIGH (ref 70–99)

## 2020-10-16 LAB — POTASSIUM: Potassium: 5.8 mmol/L — ABNORMAL HIGH (ref 3.5–5.1)

## 2020-10-16 MED ORDER — FENTANYL CITRATE (PF) 100 MCG/2ML IJ SOLN
INTRAMUSCULAR | Status: AC | PRN
  Administered 2020-10-16 (×3): 25 ug via INTRAVENOUS

## 2020-10-16 MED ORDER — CEFAZOLIN SODIUM-DEXTROSE 2-4 GM/100ML-% IV SOLN
INTRAVENOUS | Status: AC
  Administered 2020-10-16: 2000 mg
  Filled 2020-10-16: qty 100

## 2020-10-16 MED ORDER — IOHEXOL 300 MG/ML  SOLN: 100.0000 mL | Freq: Once | INTRAMUSCULAR | Status: DC | PRN

## 2020-10-16 MED ORDER — HEPARIN SODIUM (PORCINE) 1000 UNIT/ML IJ SOLN
INTRAMUSCULAR | Status: AC
  Filled 2020-10-16: qty 1

## 2020-10-16 MED ORDER — LIDOCAINE HCL 1 % IJ SOLN
INTRAMUSCULAR | Status: AC
  Filled 2020-10-16: qty 20

## 2020-10-16 MED ORDER — MIDAZOLAM HCL 2 MG/2ML IJ SOLN
INTRAMUSCULAR | Status: AC | PRN
  Administered 2020-10-16: 1 mg via INTRAVENOUS
  Administered 2020-10-16 (×2): 0.5 mg via INTRAVENOUS

## 2020-10-16 MED ORDER — MIDAZOLAM HCL 2 MG/2ML IJ SOLN
INTRAMUSCULAR | Status: AC
  Filled 2020-10-16: qty 2

## 2020-10-16 MED ORDER — LIDOCAINE-EPINEPHRINE 1 %-1:100000 IJ SOLN
INTRAMUSCULAR | Status: AC
  Filled 2020-10-16: qty 1

## 2020-10-16 MED ORDER — ALTEPLASE 2 MG IJ SOLR
INTRAMUSCULAR | Status: AC
  Filled 2020-10-16: qty 8

## 2020-10-16 MED ORDER — SODIUM CHLORIDE 0.9 % IV SOLN: INTRAVENOUS | Status: DC

## 2020-10-16 MED ORDER — LIDOCAINE-EPINEPHRINE 1 %-1:100000 IJ SOLN
INTRAMUSCULAR | Status: AC | PRN
  Administered 2020-10-16: 10 mL

## 2020-10-16 MED ORDER — FENTANYL CITRATE (PF) 100 MCG/2ML IJ SOLN
INTRAMUSCULAR | Status: AC
  Filled 2020-10-16: qty 2

## 2020-10-16 NOTE — Procedures (Signed)
Interventional Radiology Procedure Note  Procedure:  1) Ultrasound guided access of right upper extremity AVG 2) Tunneled hemodialysis catheter placement  Findings: Please refer to procedural dictation for full description.  Unable to access AVG due to extensive, hardened thrombus.  Right IJ tunneled HD catheter placed, 23 cm.  Complications: None immediate  Estimated Blood Loss: <5 mL  Recommendations: HD catheter ready for immediate use.   Ruthann Cancer, MD

## 2020-10-16 NOTE — H&P (Signed)
Chief Complaint: Patient was seen in consultation today for right arm dialysis graft declot at the request of Lyles,Charles  Referring Physician(s): Lyles,Charles  Supervising Physician: Dr Gretta Cool  Patient Status: [place]  History of Present Illness: Zachary [name] is a 74 y.o. male   ESRD DM Mental retardation  Clotted RUA dialysis graft Placed 10/2018 by Dr Donzetta Matters per notes Last use "last week" per pt PA Lyles did send order yesterday  Pt unable to consent for self- I have spoken to [place] social worker Pt is ward of state [facility]  No intervention in IR for this graft  Past Medical History:  Diagnosis Date  . Abnormal CT scan, kidney 10/06/2011  . Acute pyelonephritis 10/07/2011  . Anemia    normocytic  . Anxiety    mental retardation  . Bladder wall thickening 10/06/2011  . BPH (benign prostatic hypertrophy)   . Diabetes mellitus   . Dialysis patient [facility])    Tuesday, Thursday and Saturday,   . DVT of leg (deep venous thrombosis) ([place]) 12/25/2016  . Edema     history of lower extremity edema  . GERD (gastroesophageal reflux disease)   . Heme positive stool   . Hydronephrosis   . Hyperkalemia   . Hyperlipidemia   . Hypernatremia   . Hypertension   . Hypothyroidism   . Impaired speech   . 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 MR (mental retardation)   . Muscle weakness   . Obstructive uropathy   . 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 Poor historian poor historian  . Protein calorie malnutrition ([place])   . Pyelonephritis   . Renal failure (ARF), acute on chronic (HCC)   . Renal insufficiency    chronic history  . Sepsis ([place])   . Smoking   . Uremia   . Urinary retention   . UTI (lower urinary tract infection) 10/06/2011    Past Surgical History:  Procedure Laterality Date  . A/V FISTULAGRAM N/A 08/13/2018   Procedure: A/V FISTULAGRAM - Right Upper;  Surgeon: Elam Dutch, MD;  Location: [place]  CV LAB;  Service: Cardiovascular;  Laterality: N/A;  . A/V FISTULAGRAM N/A 11/22/2018   Procedure: A/V FISTULAGRAM - Right Upper;  Surgeon: Waynetta Sandy, MD;  Location: [place] CV LAB;  Service: Cardiovascular;  Laterality: N/A;  . AV FISTULA PLACEMENT Left 07/06/2015   Procedure:  INSERTION LEFT ARM ARTERIOVENOUS GORTEX GRAFT;  Surgeon: Angelia Mould, MD;  Location: [place];  Service: Vascular;  Laterality: Left;  . AV FISTULA PLACEMENT Right 02/26/2016   Procedure: ARTERIOVENOUS (AV) FISTULA CREATION ;  Surgeon: Angelia Mould, MD;  Location: [place];  Service: Vascular;  Laterality: Right;  . AV FISTULA PLACEMENT Right 11/25/2018   Procedure: INSERTION OF ARTERIOVENOUS (AV) ARTEGRAFT RIGHT UPPER ARM;  Surgeon: Waynetta Sandy, MD;  Location: [place];  Service: Vascular;  Laterality: Right;  . [place] REMOVAL Left 10/09/2015   Procedure: REMOVAL OF ARTERIOVENOUS GORETEX GRAFT ([place]) Evacuation of Lymphocele, Vein Patch angioplasty of brachial artery.;  Surgeon: Angelia Mould, MD;  Location: [place];  Service: Vascular;  Laterality: Left;  . BASCILIC VEIN TRANSPOSITION Right 02/26/2016   Procedure: Right BASCILIC VEIN TRANSPOSITION;  Surgeon: Angelia Mould, MD;  Location: [place];  Service: Vascular;  Laterality: Right;  . CIRCUMCISION N/A 01/04/2014   Procedure: CIRCUMCISION ADULT (procedure #1);  Surgeon: Marissa Nestle, MD;  [facility];  Service: Urology;  Laterality:  N/A;  . COLECTOMY N/A 05/04/2017   Procedure: TOTAL COLECTOMY;  Surgeon: Aviva Signs, MD;  Location: [facility];  Service: General;  Laterality: N/A;  . COLONOSCOPY N/A 04/27/2017   Procedure: COLONOSCOPY;  Surgeon: Daneil Dolin, MD;  [facility];  Service: Endoscopy;  Laterality: N/A;  245  . CYSTOSCOPY W/ RETROGRADES Bilateral 06/29/2015   Procedure: CYSTOSCOPY, DILATION OF URETHRAL STRICTURE WITH BILATERAL RETROGRADE PYELOGRAM,SUPRAPUBIC TUBE CHANGE;  Surgeon: Festus Aloe, MD;  [facility]: [facility];  Service: Urology;  Laterality: Bilateral;  . CYSTOSCOPY WITH URETHRAL DILATATION N/A 12/29/2013   Procedure: CYSTOSCOPY WITH URETHRAL DILATATION;  Surgeon: Marissa Nestle, MD;  [facility];  Service: Urology;  Laterality: N/A;  . ESOPHAGOGASTRODUODENOSCOPY N/A 04/27/2017   Procedure: ESOPHAGOGASTRODUODENOSCOPY (EGD);  Surgeon: Daneil Dolin, MD;  [facility];  Service: Endoscopy;  Laterality: N/A;  . INSERTION OF DIALYSIS CATHETER Right 11/25/2018   Procedure: INSERTION OF DIALYSIS CATHETER RIGHT INTERNAL JUGULAR;  Surgeon: Waynetta Sandy, MD;  Location: [place];  Service: Vascular;  Laterality: Right;  . IR AV DIALY SHUNT INTRO [place] W/PTA/IMG RIGHT Right 09/07/2018  . IR REMOVAL TUN CV CATH W/O FL  01/12/2019  . IR THROMBECTOMY AV FISTULA W/THROMBOLYSIS/PTA INC/SHUNT/IMG RIGHT Right 04/26/2018  . IR US GUIDE VASC ACCESS RIGHT  04/26/2018  . IR US GUIDE VASC ACCESS RIGHT  09/07/2018  . ORIF FEMUR FRACTURE Right 11/22/2016   Procedure: OPEN REDUCTION INTERNAL FIXATION (ORIF) DISTAL FEMUR FRACTURE;  Surgeon: Rod Can, MD;  Location: [place];  Service: Orthopedics;  Laterality: Right;  . PATCH ANGIOPLASTY Right 12/10/2017   Procedure: PATCH ANGIOPLASTY;  Surgeon: Angelia Mould, MD;  [facility];  Service: Vascular;  Laterality: Right;  . PERIPHERAL VASCULAR BALLOON ANGIOPLASTY  08/13/2018   Procedure: PERIPHERAL VASCULAR BALLOON ANGIOPLASTY;  Surgeon: Elam Dutch, MD;  Location: [place] CV LAB;  Service: Cardiovascular;;  right AV fistula   . PERIPHERAL VASCULAR BALLOON ANGIOPLASTY  11/22/2018   Procedure: PERIPHERAL VASCULAR BALLOON ANGIOPLASTY;  Surgeon: Waynetta Sandy, MD;  Location: [place] CV LAB;  Service: Cardiovascular;;  rt AV fistula  . PERIPHERAL VASCULAR CATHETERIZATION N/A 10/08/2015   Procedure: A/V Shuntogram;  Surgeon: Angelia Mould, MD;  Location: [place]  CV LAB;  Service: Cardiovascular;  Laterality: N/A;  . THROMBECTOMY W/ EMBOLECTOMY Right 12/10/2017   Procedure: THROMBECTOMY REVISION RIGHT ARM  ARTERIOVENOUS FISTULA;  Surgeon: Angelia Mould, MD;  Location: [place];  Service: Vascular;  Laterality: Right;  . TRANSURETHRAL RESECTION OF PROSTATE N/A 01/04/2014   Procedure: TRANSURETHRAL RESECTION OF THE PROSTATE (TURP) (procedure #2);  Surgeon: Marissa Nestle, MD;  [facility];  Service: Urology;  Laterality: N/A;    Allergies: No known allergies  Medications: Prior to Admission medications   Medication Sig Start Date End Date Taking? Authorizing Provider  Amino Acids-Protein Hydrolys (FEEDING SUPPLEMENT, PRO-STAT 64,) LIQD Take 30 mLs by mouth 2 (two) times daily between meals. 07/07/20  Yes [provider]  atorvastatin (LIPITOR) 80 MG tablet Take 80 mg by mouth every evening.   Yes [provider]  insulin aspart (NOVOLOG FLEXPEN) 100 UNIT/ML FlexPen Inject 20 Units into the skin daily. If cbg>150; subcutaneous 09/05/20  Yes [provider]  insulin aspart (NOVOLOG) 100 UNIT/ML FlexPen Inject 15 Units into the skin 2 (two) times daily. Hold insulin on dialysis days if cbg's less than 150 on mon,wed,fri 08/13/20  Yes [provider]  Insulin Glargine (BASAGLAR KWIKPEN) 100  UNIT/ML SOPN Inject 45 Units into the skin at bedtime.  07/17/20  Yes [provider]  JANUVIA 25 MG tablet Take 25 mg by mouth daily.  11/01/18  Yes [provider]  levothyroxine (SYNTHROID, LEVOTHROID) 88 MCG tablet Take 88 mcg by mouth daily before breakfast.   Yes [provider]  midodrine (PROAMATINE) 10 MG tablet Take 10 mg by mouth. Send med with resident on Mon/Wed/Fri to dialysis. do not give [facility] give to resident to take with him. send with 5mg  to equal 15mg . 06/01/19  Yes [provider]  NON FORMULARY 1200cc fluid restrictions. Dietary to provide 400cc/24hrs Nursing to  give 7-3=140cc/24hrs 3-11=560cc/24hrs 11-7=100cc/24 Special Instructions: Document amount of fluids given each shift. Every Shift Day, Evening, Night 07/06/20  Yes [provider]  NON FORMULARY Diet Type:  NAS, Cons CHO Diet   Yes [provider]  omega-3 acid ethyl esters (LOVAZA) 1 g capsule Take 2 g by mouth at bedtime. 06/03/19  Yes [provider]  omeprazole (PRILOSEC) 40 MG capsule Take 40 mg by mouth daily.  01/23/20  Yes [provider]  polyethylene glycol (MIRALAX / GLYCOLAX) packet Take 17 g by mouth daily as needed for mild constipation.  05/27/20  Yes [provider]  sevelamer carbonate (RENVELA) 800 MG tablet Take 800 mg by mouth 3 (three) times daily with meals. 9 am, 12 pm, and 5 pm 07/11/20  Yes [provider]  sevelamer carbonate (RENVELA) 800 MG tablet Take 800 mg by mouth every Monday, Wednesday, and Friday.  09/14/20  Yes [provider]  tamsulosin (FLOMAX) 0.4 MG CAPS capsule Take 0.4 mg by mouth every evening. Give 30 minutes after a meal   Yes [provider]  torsemide (DEMADEX) 10 MG tablet Take 5 tablets (50 mg total) by mouth daily. 04/07/16  Yes [name], MD  midodrine (PROAMATINE) 5 MG tablet Take 5 mg by mouth. Send with resident on dialysis days.  Mon, Wed, Fri.  Take along with 10 mg to equal 15 mg 06/01/19   [provider]     Family History  Problem Relation Age of Onset  . Cancer Mother   . Colon cancer Neg Hx     Social History   Socioeconomic History  . Marital status: Single    Spouse name: Not on file  . Number of children: Not on file  . Years of education: Not on file  . Highest education level: Not on file  Occupational History  . Occupation: retired   Tobacco Use  . Smoking status: Never Smoker  . Smokeless tobacco: Never Used  Vaping Use  . Vaping Use: Never used  Substance and Sexual Activity  . Alcohol use: No  . Drug use: No  . Sexual activity: Not  Currently  Other Topics Concern  . 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   [facility]    Social Determinants of Health   Financial Resource Strain: Not on file  Food Insecurity: Not on file  Transportation Needs: Not on file  Physical Activity: Not on file  Stress: Not on file  Social Connections: Not on file     Review of Systems: A 12 point ROS discussed and pertinent positives are indicated in the HPI above.  All other systems are negative.  Review of Systems  Constitutional: Negative for activity change.  Cardiovascular: Negative for chest pain.  Gastrointestinal: Negative for abdominal pain.  Psychiatric/Behavioral: Positive for confusion. Negative for behavioral problems.    Vital Signs: BP (!) 145/78   Pulse 96   Temp (!) 97.5 F (36.4 C) (Oral)   Resp 16   Ht 5\' 8"  (1.727 m)   Wt 221 lb (100.2 kg)   SpO2 100%   BMI 33.60 kg/m   Physical Exam Vitals reviewed.  Cardiovascular:     Rate and Rhythm: Normal rate and regular rhythm.     Heart sounds: Normal heart sounds.  Pulmonary:     Effort: Pulmonary effort is normal.     Breath sounds: Normal breath sounds.  Abdominal:     Palpations: Abdomen is soft.  Musculoskeletal:        General: Normal range of motion.     Comments: RUA dialysis graft no pulse No thrill  Skin:    General: Skin is warm.  Neurological:     Mental Status: He is alert. Mental status is at baseline.  Psychiatric:        Behavior: Behavior normal.     Comments: Spoke to [employer] pts guardian for consent     Imaging: No results found.  Labs:  CBC: Recent Labs    04/09/20 0607 09/10/20 1339  WBC 6.2 11.8*  HGB 10.7* 12.9*  HCT 33.5* 40.4  PLT 144* 163    COAGS: No results for input(s): INR, APTT in the last 8760 hours.  BMP: Recent Labs    04/09/20 0607 09/10/20 1339  NA 137 138  K 4.3 3.8  CL 104 96*  CO2  21* 25  GLUCOSE 185* 225*  BUN 43* 30*  CALCIUM 8.7* 9.2  CREATININE 10.75* 8.92*  GFRNONAA 4* 6*  GFRAA 5*  --     LIVER FUNCTION TESTS: Recent Labs    04/09/20 0607  BILITOT 0.5  AST 18  ALT 27  ALKPHOS 87  PROT 7.3  ALBUMIN 3.7    TUMOR MARKERS: No results for input(s): AFPTM, CEA, CA199, CHROMGRNA in the last 8760 hours.  Assessment and Plan:  ESRD Clotted RUA graft Scheduled for thrombolysis possible angioplasty/stent Possible tunneled dialysis catheter placement Risks and benefits discussed with the patient's guardian Melissa Price via phone including, but not limited to bleeding, infection, vascular injury, pulmonary embolism, need for tunneled HD catheter placement or even death.  All questions were answered, she is agreeable to proceed. Consent signed and in chart.   Thank you for this interesting consult.  I greatly enjoyed meeting Zachary [name] and look forward to participating in their care.  A copy of this report was sent to the requesting provider on this date.  Electronically Signed: Lavonia Drafts, PA-C 10/16/2020, 8:41 AM   I spent a total of  30 Minutes   in face to face in clinical consultation, greater than 50% of which was counseling/coordinating care for RUA dialysis graft declot

## 2020-10-16 NOTE — Progress Notes (Signed)
Initial assessment and report by Karlene Einstein, RN, stated, right brachial fistula attempted to declot, unsuccessful; it has gauze dressing with old blood (cleaned). New tunneled central line started Right chest, clear dressing dry and intact.

## 2020-10-16 NOTE — Discharge Instructions (Addendum)
Central Line Dialysis Access Placement, Care After This sheet gives you information about how to care for yourself after your procedure. Your health care provider may also give you more specific instructions. If you have problems or questions, contact your health care provider. What can I expect after the procedure? After the procedure, it is common to have:  Mild pain or discomfort.  Mild redness, swelling, or bruising around your incision.  A small amount of blood or clear fluid coming from your incision. Follow these instructions at home: Incision care   Follow instructions from your health care provider about how to take care of your incision. Make sure you: ? Wash your hands with soap and water before you change your bandage (dressing). If soap and water are not available, use hand sanitizer. ? Change your dressing as told by your health care provider. ? Leave stitches (sutures) in place.  Check your incision area every day for signs of infection. Check for: ? More redness, swelling, or pain. ? More fluid or blood. ? Warmth. ? Pus or a bad smell.  If directed, put heat on the catheter site as often as told by your health care provider. Use the heat source that your health care provider recommends, such as a moist heat pack or a heating pad. ? Place a towel between your skin and the heat source. ? Leave the heat on for 20-30 minutes. ? Remove the heat if your skin turns bright red. This is especially important if you are unable to feel pain, heat, or cold. You may have a greater risk of getting burned.  If directed, put ice on the catheter site: ? Put ice in a plastic bag. ? Place a towel between your skin and the bag. ? Leave the ice on for 20 minutes, 2-3 times a day. Medicines  Take over-the-counter and prescription medicines only as told by your health care provider.  If you were prescribed an antibiotic medicine, use it as told by your health care provider. Do not stop  using the antibiotic even if you start to feel better. Activity  Return to your normal activities as told by your health care provider. Ask your health care provider what activities are safe for you.  Do not lift anything that is heavier than 10 lb (4.5 kg) until your health care provider says that this is safe. Driving  Do not drive for 24 hours if you were given a medicine to help you relax (sedative) during your procedure.  Do not drive or use heavy machinery while taking prescription pain medicine. Lifestyle  Limit alcohol intake to no more than 1 drink a day for nonpregnant women and 2 drinks a day for men. One drink equals 12 oz of beer, 5 oz of wine, or 1 oz of hard liquor.  Do not use any products that contain nicotine or tobacco, such as cigarettes and e-cigarettes. If you need help quitting, ask your health care provider. General instructions  Do not take baths or showers, swim, or use a hot tub until your health care provider approves. You may only be allowed to take sponge baths for bathing.  Wear compression stockings as told by your health care provider. These stockings help to prevent blood clots and reduce swelling in your legs.  Follow instructions from your health care provider about eating or drinking restrictions.  Keep all follow-up visits as told by your health care provider. This is important. Contact a health care provider if:  Your  catheter gets pulled out of place.  Your catheter site becomes itchy.  You develop a rash around your catheter site.  You have more redness, swelling, or pain around your incision.  You have more fluid or blood coming from your incision.  Your incision area feels warm to the touch.  You have pus or a bad smell coming from your incision.  You have a fever. Get help right away if:  You become light-headed or dizzy.  You faint.  You have difficulty breathing.  Your catheter gets pulled out completely. This  information is not intended to replace advice given to you by your health care provider. Make sure you discuss any questions you have with your health care provider. Document Revised: 09/25/2017 Document Reviewed: 07/07/2016 Elsevier Patient Education  2020 [employer]. Dialysis Vascular Access Malfunction        A vascular access is an entrance to your blood vessels that can be used for dialysis. Dialysis is a treatment used for kidney failure. A health care provider can make a vascular access in many ways, such as by:  Joining an artery to a vein under your skin to make a bigger blood vessel called a fistula.  Joining an artery to a vein under your skin using a soft tube called a graft.  Placing a thin, flexible tube (catheter) in a large vein, usually in your neck, chest, or groin. A vascular access can become blocked or stop working correctly (malfunction). What can cause my vascular access to malfunction?  Infection. This is the most common cause of malfunction.  A blood clot inside a part of the fistula, graft, or catheter. A blood clot can completely or partially block the flow of blood.  A kink in the graft or catheter.  A collection of blood (hematoma or bruise) next to the graft or catheter that pushes against it, blocking the flow of blood. What are signs and symptoms of vascular access malfunction? Signs and symptoms of vascular access malfunction include:  A change in the vibration or pulse (thrill) of your fistula or graft.  The thrill of your fistula or graft being gone.  New or unusual swelling of the area around the access.  An unsuccessful puncture of your access by the dialysis team.  The flow of blood through the fistula, graft, or catheter being too slow for effective dialysis.  Bleeding that cannot be easily controlled when routine dialysis is completed and the needle is removed.  Signs of infection such as pain, swelling, redness, red streaks,  numbness, and blood or pus coming from or around the access. What happens if my vascular access malfunctions? If your vascular access malfunctions, your health care provider may order blood work, cultures, or an X-ray test to find out what went wrong. The X-ray test involves the injection of a dye (contrast) into the vascular access. The contrast shows up on the X-ray and lets your health care provider see if there is a blockage in the vascular access. Treatment varies depending on the cause of the malfunction:  If the vascular access is infected, your health care provider may prescribe antibiotic medicine to control the infection.  If a clot is found in the vascular access, you may need surgery to remove the clot. Blood thinning medicines may also be used.  If a blockage in the vascular access is due to some other cause, such as a kink in a graft, you will likely need surgery to unblock or replace the graft.  If there is a malfunction for any reason, your health care provider may remove and replace your access. Follow these instructions at home: Medicines  Take over-the-counter and prescription medicines only as told by your health care provider.  If you were prescribed an antibiotic medicine, use it as told by your health care provider. Do not stop using the antibiotic even if you start to feel better. Activity  Do not lift heavy things with the arm that has the access.  Try not to bump or hurt the arm that has the access. Lifestyle  Do not wear jewelry or tight clothing around the access.  Do not sleep by placing the access arm under your head or body. General instructions  Wash your hands with soap and water before touching the area around the vascular access. If soap and water are not available, use hand sanitizer.  Keep all follow-up visits as told by your health care provider. This is very important. Any delay in follow-up could cause permanent dysfunction of the vascular  access, which may be dangerous.  Do not measure your blood pressure on the arm that has the access.  Keep the access area clean and do not use makeup, creams, lotions, or ointments on it.  Check your access area every day for signs of infection. Check for: ? More redness, swelling, or pain. ? More fluid or blood. ? Warmth. ? Pus or a bad smell. Contact a health care provider if:  Swelling around the vascular access gets worse.  You develop new pain. Get help right away if:  You have bleeding at the vascular access that cannot be easily controlled.  You have pain, numbness, an unusual pale skin color, or blue fingers or sores at the tips of your fingers in the hand on the side of your fistula.  You have chills.  You have a fever.  You have pus or other fluid (drainage) at the vascular access site.  You develop skin redness or red streaking on the skin around, above, or below the vascular access.  Your access is hot, swollen, red, and very painful.  You can suddenly see the cuff of your catheter used in the access. Summary  A vascular access is an entrance to your blood vessels that can be used for dialysis.  Several things can cause your vascular access to malfunction.  Treatment varies depending on the cause of the malfunction.  Keep all follow-up visits as told by your health care provider. Any delay in follow-up could cause permanent dysfunction of the vascular access, which may be dangerous. This information is not intended to replace advice given to you by your health care provider. Make sure you discuss any questions you have with your health care provider. Document Revised: 01/08/2018 Document Reviewed: 11/07/2016 Elsevier Patient Education  [employer]. Moderate Conscious Sedation, Adult Sedation is the use of medicines to promote relaxation and relieve discomfort and anxiety. Moderate conscious sedation is a type of sedation. Under moderate conscious  sedation, you are less alert than normal, but you are still able to respond to instructions, touch, or both. Moderate conscious sedation is used during short medical and dental procedures. It is milder than deep sedation, which is a type of sedation under which you cannot be easily woken up. It is also milder than general anesthesia, which is the use of medicines to make you unconscious. Moderate conscious sedation allows you to return to your regular activities sooner. Tell a health care provider about:  Any allergies you have.  All medicines you are taking, including vitamins, herbs, eye drops, creams, and over-the-counter medicines.  Use of steroids (by mouth or creams).  Any problems you or family members have had with sedatives and anesthetic medicines.  Any blood disorders you have.  Any surgeries you have had.  Any medical conditions you have, such as sleep apnea.  Whether you are pregnant or may be pregnant.  Any use of cigarettes, alcohol, marijuana, or street drugs. What are the risks? Generally, this is a safe procedure. However, problems may occur, including:  Getting too much medicine (oversedation).  Nausea.  Allergic reaction to medicines.  Trouble breathing. If this happens, a breathing tube may be used to help with breathing. It will be removed when you are awake and breathing on your own.  Heart trouble.  Lung trouble. What happens before the procedure? Staying hydrated Follow instructions from your health care provider about hydration, which may include:  Up to 2 hours before the procedure - you may continue to drink clear liquids, such as water, clear fruit juice, black coffee, and plain tea. Eating and drinking restrictions Follow instructions from your health care provider about eating and drinking, which may include:  8 hours before the procedure - stop eating heavy meals or foods such as meat, fried foods, or fatty foods.  6 hours before the  procedure - stop eating light meals or foods, such as toast or cereal.  6 hours before the procedure - stop drinking milk or drinks that contain milk.  2 hours before the procedure - stop drinking clear liquids. Medicine Ask your health care provider about:  Changing or stopping your regular medicines. This is especially important if you are taking diabetes medicines or blood thinners.  Taking medicines such as aspirin and ibuprofen. These medicines can thin your blood. Do not take these medicines before your procedure if your health care provider instructs you not to.  Tests and exams  You will have a physical exam.  You may have blood tests done to show: ? How well your kidneys and liver are working. ? How well your blood can clot. General instructions  Plan to have someone take you home from the hospital or clinic.  If you will be going home right after the procedure, plan to have someone with you for 24 hours. What happens during the procedure?  An IV tube will be inserted into one of your veins.  Medicine to help you relax (sedative) will be given through the IV tube.  The medical or dental procedure will be performed. What happens after the procedure?  Your blood pressure, heart rate, breathing rate, and blood oxygen level will be monitored often until the medicines you were given have worn off.  Do not drive for 24 hours. This information is not intended to replace advice given to you by your health care provider. Make sure you discuss any questions you have with your health care provider. Document Revised: 09/25/2017 Document Reviewed: 02/02/2016 Elsevier Patient Education  2020 [employer].

## 2020-10-17 ENCOUNTER — Other Ambulatory Visit: Payer: Self-pay

## 2020-10-17 ENCOUNTER — Inpatient Hospital Stay
Admission: RE | Admit: 2020-10-17 | Discharge: 2021-05-28 | Disposition: A | Discharge status: Other Acute Inpatient Hospital | Payer: Medicare Other | Source: Ambulatory Visit | Attending: Internal Medicine | Admitting: Internal Medicine

## 2020-10-17 ENCOUNTER — Encounter (HOSPITAL_COMMUNITY): Payer: Self-pay | Admitting: Emergency Medicine

## 2020-10-17 ENCOUNTER — Emergency Department (HOSPITAL_COMMUNITY)
Admission: EM | Admit: 2020-10-17 | Discharge: 2020-10-17 | Disposition: A | Discharge status: Home / Self Care | Payer: Medicare Other | Attending: Emergency Medicine | Admitting: Emergency Medicine

## 2020-10-17 DIAGNOSIS — I959 Hypotension, unspecified: Secondary | ICD-10-CM | POA: Diagnosis not present

## 2020-10-17 DIAGNOSIS — Z794 Long term (current) use of insulin: Secondary | ICD-10-CM | POA: Insufficient documentation

## 2020-10-17 DIAGNOSIS — E1169 Type 2 diabetes mellitus with other specified complication: Secondary | ICD-10-CM | POA: Diagnosis not present

## 2020-10-17 DIAGNOSIS — Z85038 Personal history of other malignant neoplasm of large intestine: Secondary | ICD-10-CM | POA: Insufficient documentation

## 2020-10-17 DIAGNOSIS — E785 Hyperlipidemia, unspecified: Secondary | ICD-10-CM | POA: Insufficient documentation

## 2020-10-17 DIAGNOSIS — Z992 Dependence on renal dialysis: Secondary | ICD-10-CM | POA: Diagnosis not present

## 2020-10-17 DIAGNOSIS — E113299 Type 2 diabetes mellitus with mild nonproliferative diabetic retinopathy without macular edema, unspecified eye: Secondary | ICD-10-CM | POA: Diagnosis not present

## 2020-10-17 DIAGNOSIS — N186 End stage renal disease: Secondary | ICD-10-CM | POA: Insufficient documentation

## 2020-10-17 DIAGNOSIS — R55 Syncope and collapse: Secondary | ICD-10-CM | POA: Diagnosis not present

## 2020-10-17 DIAGNOSIS — N2581 Secondary hyperparathyroidism of renal origin: Secondary | ICD-10-CM | POA: Diagnosis not present

## 2020-10-17 DIAGNOSIS — E039 Hypothyroidism, unspecified: Secondary | ICD-10-CM | POA: Diagnosis not present

## 2020-10-17 DIAGNOSIS — D631 Anemia in chronic kidney disease: Secondary | ICD-10-CM | POA: Diagnosis not present

## 2020-10-17 DIAGNOSIS — I120 Hypertensive chronic kidney disease with stage 5 chronic kidney disease or end stage renal disease: Secondary | ICD-10-CM | POA: Insufficient documentation

## 2020-10-17 DIAGNOSIS — Z79899 Other long term (current) drug therapy: Secondary | ICD-10-CM | POA: Insufficient documentation

## 2020-10-17 DIAGNOSIS — D509 Iron deficiency anemia, unspecified: Secondary | ICD-10-CM | POA: Diagnosis not present

## 2020-10-17 DIAGNOSIS — R0902 Hypoxemia: Secondary | ICD-10-CM | POA: Diagnosis not present

## 2020-10-17 DIAGNOSIS — E1122 Type 2 diabetes mellitus with diabetic chronic kidney disease: Secondary | ICD-10-CM | POA: Diagnosis not present

## 2020-10-17 DIAGNOSIS — E1159 Type 2 diabetes mellitus with other circulatory complications: Secondary | ICD-10-CM | POA: Diagnosis not present

## 2020-10-17 LAB — CBC WITH DIFFERENTIAL/PLATELET
Abs Immature Granulocytes: 0.08 10*3/uL — ABNORMAL HIGH (ref 0.00–0.07)
Basophils Absolute: 0 10*3/uL (ref 0.0–0.1)
Basophils Relative: 0 %
Eosinophils Absolute: 0.1 10*3/uL (ref 0.0–0.5)
Eosinophils Relative: 1 %
HCT: 34.3 % — ABNORMAL LOW (ref 39.0–52.0)
Hemoglobin: 11 g/dL — ABNORMAL LOW (ref 13.0–17.0)
Immature Granulocytes: 1 %
Lymphocytes Relative: 12 %
Lymphs Abs: 1 10*3/uL (ref 0.7–4.0)
MCH: 34.7 pg — ABNORMAL HIGH (ref 26.0–34.0)
MCHC: 32.1 g/dL (ref 30.0–36.0)
MCV: 108.2 fL — ABNORMAL HIGH (ref 80.0–100.0)
Monocytes Absolute: 0.8 10*3/uL (ref 0.1–1.0)
Monocytes Relative: 10 %
Neutro Abs: 6.3 10*3/uL (ref 1.7–7.7)
Neutrophils Relative %: 76 %
Platelets: 121 10*3/uL — ABNORMAL LOW (ref 150–400)
RBC: 3.17 MIL/uL — ABNORMAL LOW (ref 4.22–5.81)
RDW: 13.8 % (ref 11.5–15.5)
WBC: 8.2 10*3/uL (ref 4.0–10.5)
nRBC: 0.2 % (ref 0.0–0.2)

## 2020-10-17 LAB — BASIC METABOLIC PANEL
Anion gap: 18 — ABNORMAL HIGH (ref 5–15)
BUN: 51 mg/dL — ABNORMAL HIGH (ref 8–23)
CO2: 18 mmol/L — ABNORMAL LOW (ref 22–32)
Calcium: 8.5 mg/dL — ABNORMAL LOW (ref 8.9–10.3)
Chloride: 102 mmol/L (ref 98–111)
Creatinine, Ser: 13.25 mg/dL — ABNORMAL HIGH (ref 0.61–1.24)
GFR, Estimated: 4 mL/min — ABNORMAL LOW (ref >60)
Glucose, Bld: 141 mg/dL — ABNORMAL HIGH (ref 70–99)
Potassium: 3.9 mmol/L (ref 3.5–5.1)
Sodium: 138 mmol/L (ref 135–145)

## 2020-10-17 LAB — TROPONIN I (HIGH SENSITIVITY): Troponin I (High Sensitivity): 17 ng/L (ref <18)

## 2020-10-17 LAB — MAGNESIUM: Magnesium: 2.1 mg/dL (ref 1.7–2.4)

## 2020-10-17 MED ORDER — SODIUM CHLORIDE 0.9 % IV BOLUS
250.0000 mL | Freq: Once | INTRAVENOUS | Status: AC
  Administered 2020-10-17: 250 mL via INTRAVENOUS

## 2020-10-17 NOTE — ED Provider Notes (Signed)
[facility] EMERGENCY DEPARTMENT Provider Note   CSN: [id number] Arrival date & time: 10/17/20  0623     History Chief Complaint  Patient presents with  . Hypotension    Zachary [name] is a 74 y.o. male he has a past medical history of intellectual disability, diabetes, hypertension.  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  The patient is a poor historian and history is gathered from EMS as well as the nursing staff at his long-term care facility. Per the [facility] center- Pt on HD for years. His vasc access clotted off and he is currently using an HD catheter.  Staff states " he always gets hypotensive at dialysis" and has a baseline soft blood pressure.  According to the paramedic they were called out because the patient looked like he was going to pass out and had low blood pressures.  Upon their arrival he was very mildly diaphoretic and had a systolic pressure of 88 however had no complaints.  EMS showed me a log of the patient's blood pressures which were variable throughout.  He took a manual blood pressure.  He states that the patient was awake and alert throughout his examination and had 1+ bilateral radial pulses.  Patient denies chest pain, shortness of breath and states that he did feel dizzy earlier.  Patient was able to get about two thirds of his dialysis treatment prior to arrival here. HPI     Past Medical History:  Diagnosis Date  . Abnormal CT scan, kidney 10/06/2011  . Acute pyelonephritis 10/07/2011  . Anemia    normocytic  . Anxiety    mental retardation  . Bladder wall thickening 10/06/2011  . BPH (benign prostatic hypertrophy)   . Diabetes mellitus   . Dialysis patient [facility])    Tuesday, Thursday and Saturday,   . DVT of leg (deep venous thrombosis) ([place]) 12/25/2016  . Edema     history of lower extremity edema  . GERD (gastroesophageal reflux disease)   . Heme positive stool   . Hydronephrosis   . Hyperkalemia   . Hyperlipidemia   .  Hypernatremia   . Hypertension   . Hypothyroidism   . Impaired speech   . 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 MR (mental retardation)   . Muscle weakness   . Obstructive uropathy   . 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 Poor historian poor historian  . Protein calorie malnutrition ([place])   . Pyelonephritis   . Renal failure (ARF), acute on chronic (HCC)   . Renal insufficiency    chronic history  . Sepsis ([place])   . Smoking   . Uremia   . Urinary retention   . UTI (lower urinary tract infection) 10/06/2011    Patient Active Problem List   Diagnosis Date Noted  . Aortic atherosclerosis ([place]) 09/11/2020  . Medicare annual wellness visit, subsequent 09/26/2019  . Depression, major, single episode, moderate ([place]) 08/09/2019  . Type 2 diabetes mellitus with hypertension and end stage renal disease on dialysis ([place]) 07/31/2019  . Orthostatic hypotension 02/17/2019  . GERD without esophagitis 02/17/2019  . Dyslipidemia associated with type 2 diabetes mellitus ([place]) 02/17/2019  . Bilateral lower extremity edema 02/17/2019  . End stage renal disease on dialysis due to type 2 diabetes mellitus ([place]) 02/13/2019  . Hypothyroidism 01/19/2018  . S/P colectomy 08/31/2017  . Malignant neoplasm of colon ([place]) 06/25/2017  . Anemia in chronic kidney disease 08/23/2016  . Dependence on renal dialysis ([place]) 08/23/2016  .  Type 2 diabetes mellitus with unspecified diabetic retinopathy without macular edema ([place]) 08/23/2016  . Hydronephrosis, bilateral 03/10/2016  . Essential (primary) hypertension 07/08/2015  . Benign prostatic hyperplasia with urinary retention 03/03/2015  . Intellectual disability 10/10/2011  . Iron deficiency anemia 10/02/2011    Past Surgical History:  Procedure Laterality Date  . A/V FISTULAGRAM N/A 08/13/2018   Procedure: A/V FISTULAGRAM - Right Upper;  Surgeon: Elam Dutch, MD;  Location: [place] [facility];  Service: Cardiovascular;  Laterality: N/A;  .  A/V FISTULAGRAM N/A 11/22/2018   Procedure: A/V FISTULAGRAM - Right Upper;  Surgeon: Waynetta Sandy, MD;  Location: [place] [facility];  Service: Cardiovascular;  Laterality: N/A;  . AV FISTULA PLACEMENT Left 07/06/2015   Procedure:  INSERTION LEFT ARM ARTERIOVENOUS GORTEX GRAFT;  Surgeon: Angelia Mould, MD;  Location: [place];  Service: Vascular;  Laterality: Left;  . AV FISTULA PLACEMENT Right 02/26/2016   Procedure: ARTERIOVENOUS (AV) FISTULA CREATION ;  Surgeon: Angelia Mould, MD;  Location: [place];  Service: Vascular;  Laterality: Right;  . AV FISTULA PLACEMENT Right 11/25/2018   Procedure: INSERTION OF ARTERIOVENOUS (AV) ARTEGRAFT RIGHT UPPER ARM;  Surgeon: Waynetta Sandy, MD;  Location: [place];  Service: Vascular;  Laterality: Right;  . [place] REMOVAL Left 10/09/2015   Procedure: REMOVAL OF ARTERIOVENOUS GORETEX GRAFT ([place]) Evacuation of Lymphocele, Vein Patch angioplasty of brachial artery.;  Surgeon: Angelia Mould, MD;  Location: [place];  Service: Vascular;  Laterality: Left;  . BASCILIC VEIN TRANSPOSITION Right 02/26/2016   Procedure: Right BASCILIC VEIN TRANSPOSITION;  Surgeon: Angelia Mould, MD;  Location: [place];  Service: Vascular;  Laterality: Right;  . CIRCUMCISION N/A 01/04/2014   Procedure: CIRCUMCISION ADULT (procedure #1);  Surgeon: Marissa Nestle, MD;  Location: [facility];  Service: Urology;  Laterality: N/A;  . COLECTOMY N/A 05/04/2017   Procedure: TOTAL COLECTOMY;  Surgeon: Aviva Signs, MD;  Location: [facility];  Service: General;  Laterality: N/A;  . COLONOSCOPY N/A 04/27/2017   Procedure: COLONOSCOPY;  Surgeon: Daneil Dolin, MD;  Location: [facility];  Service: Endoscopy;  Laterality: N/A;  245  . CYSTOSCOPY W/ RETROGRADES Bilateral 06/29/2015   Procedure: CYSTOSCOPY, DILATION OF URETHRAL STRICTURE WITH BILATERAL RETROGRADE PYELOGRAM,SUPRAPUBIC TUBE CHANGE;  Surgeon: Festus Aloe, MD;  Location: [facility];  Service: Urology;   Laterality: Bilateral;  . CYSTOSCOPY WITH URETHRAL DILATATION N/A 12/29/2013   Procedure: CYSTOSCOPY WITH URETHRAL DILATATION;  Surgeon: Marissa Nestle, MD;  Location: [facility];  Service: Urology;  Laterality: N/A;  . ESOPHAGOGASTRODUODENOSCOPY N/A 04/27/2017   Procedure: ESOPHAGOGASTRODUODENOSCOPY (EGD);  Surgeon: Daneil Dolin, MD;  [facility];  Service: Endoscopy;  Laterality: N/A;  . INSERTION OF DIALYSIS CATHETER Right 11/25/2018   Procedure: INSERTION OF DIALYSIS CATHETER RIGHT INTERNAL JUGULAR;  Surgeon: Waynetta Sandy, MD;  Location: [place];  Service: Vascular;  Laterality: Right;  . IR AV DIALY SHUNT INTRO [place] W/PTA/IMG RIGHT Right 09/07/2018  . IR FLUORO GUIDE CV LINE RIGHT  10/16/2020  . IR REMOVAL TUN CV CATH W/O FL  01/12/2019  . IR THROMBECTOMY AV FISTULA W/THROMBOLYSIS/PTA INC/SHUNT/IMG RIGHT Right 04/26/2018  . IR US GUIDE VASC ACCESS RIGHT  04/26/2018  . IR US GUIDE VASC ACCESS RIGHT  09/07/2018  . IR US GUIDE VASC ACCESS RIGHT  10/16/2020  . IR US GUIDE VASC ACCESS RIGHT  10/16/2020  . ORIF FEMUR FRACTURE Right 11/22/2016   Procedure: OPEN REDUCTION INTERNAL FIXATION (ORIF) DISTAL FEMUR FRACTURE;  Surgeon: Rod Can, MD;  Location: [place];  Service: Orthopedics;  Laterality: Right;  . PATCH ANGIOPLASTY Right 12/10/2017   Procedure: PATCH ANGIOPLASTY;  Surgeon: Angelia Mould, MD;  Location: [facility];  Service: Vascular;  Laterality: Right;  . PERIPHERAL VASCULAR BALLOON ANGIOPLASTY  08/13/2018   Procedure: PERIPHERAL VASCULAR BALLOON ANGIOPLASTY;  Surgeon: Elam Dutch, MD;  Location: [place] [facility];  Service: Cardiovascular;;  right AV fistula   . PERIPHERAL VASCULAR BALLOON ANGIOPLASTY  11/22/2018   Procedure: PERIPHERAL VASCULAR BALLOON ANGIOPLASTY;  Surgeon: Waynetta Sandy, MD;  Location: [place] [facility];  Service: Cardiovascular;;  rt AV fistula  . PERIPHERAL VASCULAR CATHETERIZATION N/A 10/08/2015    Procedure: A/V Shuntogram;  Surgeon: Angelia Mould, MD;  Location: [place] [facility];  Service: Cardiovascular;  Laterality: N/A;  . THROMBECTOMY W/ EMBOLECTOMY Right 12/10/2017   Procedure: THROMBECTOMY REVISION RIGHT ARM  ARTERIOVENOUS FISTULA;  Surgeon: Angelia Mould, MD;  Location: [place];  Service: Vascular;  Laterality: Right;  . TRANSURETHRAL RESECTION OF PROSTATE N/A 01/04/2014   Procedure: TRANSURETHRAL RESECTION OF THE PROSTATE (TURP) (procedure #2);  Surgeon: Marissa Nestle, MD;  Location: [facility];  Service: Urology;  Laterality: N/A;       Family History  Problem Relation Age of Onset  . Cancer Mother   . Colon cancer Neg Hx     Social History   Tobacco Use  . Smoking status: Never Smoker  . Smokeless tobacco: Never Used  Vaping Use  . Vaping Use: Never used  Substance Use Topics  . Alcohol use: No  . Drug use: No    Home Medications Prior to Admission medications   Medication Sig Start Date End Date Taking? Authorizing Provider  Amino Acids-Protein Hydrolys (FEEDING SUPPLEMENT, PRO-STAT 64,) LIQD Take 30 mLs by mouth 2 (two) times daily between meals. 07/07/20   [provider]  atorvastatin (LIPITOR) 80 MG tablet Take 80 mg by mouth every evening.    [provider]  insulin aspart (NOVOLOG FLEXPEN) 100 UNIT/ML FlexPen Inject 20 Units into the skin daily. If cbg>150; subcutaneous 09/05/20   [provider]  insulin aspart (NOVOLOG) 100 UNIT/ML FlexPen Inject 15 Units into the skin 2 (two) times daily. Hold insulin on dialysis days if cbg's less than 150 on mon,wed,fri 08/13/20   [provider]  Insulin Glargine (BASAGLAR KWIKPEN) 100 UNIT/ML SOPN Inject 45 Units into the skin at bedtime.  07/17/20   [provider]  JANUVIA 25 MG tablet Take 25 mg by mouth daily.  11/01/18   [provider]  levothyroxine (SYNTHROID, LEVOTHROID) 88 MCG tablet Take 88 mcg by mouth daily before breakfast.    [provider]  midodrine (PROAMATINE) 10 MG tablet Take 10 mg by mouth. Send med with resident on Mon/Wed/Fri to dialysis. do not give at [facility] give to resident to take with him. send with 42m to equal 171m 06/01/19   [provider]  midodrine (PROAMATINE) 5 MG tablet Take 5 mg by mouth. Send with resident on dialysis days.  Mon, Wed, Fri.  Take along with 10 mg to equal 15 mg 06/01/19   [provider]  NON FORMULARY 1200cc fluid restrictions. Dietary to provide 400cc/24hrs Nursing to give 7-3=140cc/24hrs 3-11=560cc/24hrs 11-7=100cc/24 Special Instructions: Document amount of fluids given each shift. Every Shift Day, Evening, Night 07/06/20   [provider]  NON FORMULARY Diet Type:  NAS, Cons CHO Diet    [provider]  omega-3 acid ethyl esters (LOVAZA) 1 g capsule Take 2 g by mouth at bedtime. 06/03/19   [provider]  omeprazole (PRILOSEC) 40 MG capsule Take 40 mg by mouth daily.  01/23/20   [provider]  polyethylene glycol (MIRALAX / GLYCOLAX) packet Take 17 g by mouth daily as needed for mild constipation.  05/27/20   [provider]  sevelamer carbonate (RENVELA) 800 MG tablet Take 800 mg by mouth 3 (three) times daily with meals. 9 am, 12 pm, and 5 pm 07/11/20   [provider]  sevelamer carbonate (RENVELA) 800 MG tablet Take 800 mg by mouth every Monday, Wednesday, and Friday.  09/14/20   [provider]  tamsulosin (FLOMAX) 0.4 MG CAPS capsule Take 0.4 mg by mouth every evening. Give 30 minutes after a meal    [provider]  torsemide (DEMADEX) 10 MG tablet Take 5 tablets (50 mg total) by mouth daily. 04/07/16   [name], MD    Allergies    No known allergies  Review of Systems   Review of Systems  Unable to perform ROS: Other  MR- unreliable historian Physical Exam Updated Vital Signs BP 101/72 (BP Location: Right Arm)   Pulse 89   Temp 98.8 F (37.1 C) (Oral)   Resp 16    Ht '5\' 8"'  (1.727 m)   Wt 100 kg   SpO2 100%   BMI 33.52 kg/m   Physical Exam Vitals and nursing note reviewed.  Constitutional:      General: He is not in acute distress.    Appearance: He is well-developed and well-nourished. He is not diaphoretic.  HENT:     Head: Normocephalic and atraumatic.  Eyes:     General: No scleral icterus.    Conjunctiva/sclera: Conjunctivae normal.  Cardiovascular:     Rate and Rhythm: Normal rate and regular rhythm.     Heart sounds: Normal heart sounds.  Pulmonary:     Effort: Pulmonary effort is normal. No respiratory distress.     Breath sounds: Normal breath sounds.  Chest:    Abdominal:     Palpations: Abdomen is soft.     Tenderness: There is no abdominal tenderness.  Musculoskeletal:        General: No edema.     Cervical back: Normal range of motion and neck supple.  Skin:    General: Skin is warm and dry.  Neurological:     Mental Status: He is alert.  Psychiatric:        Behavior: Behavior normal.     ED Results / Procedures / Treatments   Labs (all labs ordered are listed, but only abnormal results are displayed) Labs Reviewed  BASIC METABOLIC PANEL - Abnormal; Notable for the following components:      Result Value   CO2 18 (*)    Glucose, Bld 141 (*)    BUN 51 (*)    Creatinine, Ser 13.25 (*)    Calcium 8.5 (*)    GFR, Estimated 4 (*)    Anion gap 18 (*)    All other components within normal limits  CBC WITH DIFFERENTIAL/PLATELET - Abnormal; Notable for the following components:   RBC 3.17 (*)    Hemoglobin 11.0 (*)    HCT 34.3 (*)    MCV 108.2 (*)    MCH 34.7 (*)    Platelets 121 (*)    Abs Immature Granulocytes 0.08 (*)    All other components within normal limits  MAGNESIUM  TROPONIN I (HIGH SENSITIVITY)    EKG EKG Interpretation  Date/Time:  Wednesday October 17 2020 09:25:18 EST Ventricular Rate:  93 PR Interval:    QRS Duration: 83 QT Interval:  371 QTC Calculation: 462 R Axis:   51 Text  Interpretation: Sinus rhythm Confirmed by Milton Ferguson [phone]) on 10/17/2020 2:33:04 PM   Radiology IR Fluoro Guide CV Line Right  Result Date: 10/16/2020 INDICATION: 74 year old male with history of right upper arm arteriovenous graft for hemodialysis presenting with 11 days of graft occlusion for possible recanalization. EXAM: 1. Ultrasound-guided access of right upper extremity hemodialysis graft. 2. Ultrasound and fluoroscopic guided placement of tunneled hemodialysis catheter. MEDICATIONS: Ancef 2 gm IV . The antibiotic was given in an appropriate time interval prior to skin puncture. ANESTHESIA/SEDATION: Moderate (conscious) sedation was employed during this procedure. A total of Versed 2 mg and Fentanyl 75 mcg was administered intravenously. Moderate Sedation Time: 56 minutes. [name] level of consciousness and vital signs were monitored continuously by radiology nursing throughout the procedure under my direct supervision. FLUOROSCOPY TIME:  Four minutes 6 seconds (8 mGy). COMPLICATIONS: None immediate. PROCEDURE: Informed written consent was obtained from the patient's legal custodian after a discussion of the risks, benefits, and alternatives to treatment. Questions regarding the procedure were encouraged and answered. The right neck, chest, and arm were prepped with chlorhexidine in a sterile fashion, and a sterile drape was applied covering the operative field. Maximum barrier sterile technique with sterile gowns and gloves were used for the procedure. A timeout was performed prior to the initiation of the procedure. Ultrasound evaluation was performed of the right upper extremity graft. There was dense echogenic material occluding the entirety of the graft. Mild perigraft subcutaneous edema was noted. The brachial artery was noted to be patent to the level of the anastomosis. A puncture site was planned near the graft arterial anastomosis. Subdermal Local anesthesia was administered 1%  lidocaine. A small skin nick was made. Under direct ultrasound visualization, a 21 gauge micropuncture needle was directed into the thrombus. A Nitrex wire was then introduced which traveled approximately 10 cm into the graft but met significant resistance. The needle was removed and a micropuncture sheath was inserted. Through the micropuncture sheath, a Wholey wire and a stiff angled glidewire were unable to traverse the on the initial 10 cm from the puncture site. Over the [facility] wire, a 6 French sheath was placed for increased ability in hopes of possible recanalization. Despite these efforts, a Glidewire through a Kumpe the catheter as well as the [facility] wire were unable to recanalized the thrombosed graft. Declot was aborted and attention was then turned to placement of a tunneled hemodialysis catheter. After creating a small venotomy incision, a 21 gauge micropuncture kit was utilized to access the internal jugular vein. Real-time ultrasound guidance was utilized for vascular access including the acquisition of a permanent ultrasound image documenting patency of the accessed vessel. A Wholey wire was advanced to the level of the IVC and the micropuncture sheath was exchanged for an 8 Fr dilator. A 14.5 French tunneled hemodialysis catheter measuring 23 cm from tip to cuff was tunneled in a retrograde fashion from the anterior chest wall to the venotomy incision. Serial dilation was then performed an a peel-away sheath was placed. The catheter was then placed through the peel-away sheath with the catheter tip ultimately positioned within the right atrium. Final catheter positioning was confirmed and documented with a spot radiographic image. The catheter aspirates and flushes normally. The  catheter was flushed with appropriate volume heparin dwells. The catheter exit site was secured with a 0-Silk retention suture. The venotomy incision was closed with Dermabond. Sterile dressings were applied. The patient  tolerated the procedure well without immediate post procedural complication. IMPRESSION: 1. Unsuccessful right upper extremity arteriovenous graft declot. Thrombus burden was too chronic and hardened for catheter and wire recanalization. 2. Successful placement of 23 cm tip to cuff tunneled hemodialysis catheter via the right internal jugular vein with catheter tip terminating within the right atrium. The catheter is ready for immediate use. Ruthann Cancer, MD [facility] Radiology Electronically Signed   By: Ruthann Cancer MD   On: 10/16/2020 13:34   IR US Guide Vasc Access Right  Result Date: 10/16/2020 INDICATION: 74 year old male with history of right upper arm arteriovenous graft for hemodialysis presenting with 11 days of graft occlusion for possible recanalization. EXAM: 1. Ultrasound-guided access of right upper extremity hemodialysis graft. 2. Ultrasound and fluoroscopic guided placement of tunneled hemodialysis catheter. MEDICATIONS: Ancef 2 gm IV . The antibiotic was given in an appropriate time interval prior to skin puncture. ANESTHESIA/SEDATION: Moderate (conscious) sedation was employed during this procedure. A total of Versed 2 mg and Fentanyl 75 mcg was administered intravenously. Moderate Sedation Time: 56 minutes. [name] level of consciousness and vital signs were monitored continuously by radiology nursing throughout the procedure under my direct supervision. FLUOROSCOPY TIME:  Four minutes 6 seconds (8 mGy). COMPLICATIONS: None immediate. PROCEDURE: Informed written consent was obtained from the patient's legal custodian after a discussion of the risks, benefits, and alternatives to treatment. Questions regarding the procedure were encouraged and answered. The right neck, chest, and arm were prepped with chlorhexidine in a sterile fashion, and a sterile drape was applied covering the operative field. Maximum barrier sterile technique with  sterile gowns and gloves were used for the procedure. A timeout was performed prior to the initiation of the procedure. Ultrasound evaluation was performed of the right upper extremity graft. There was dense echogenic material occluding the entirety of the graft. Mild perigraft subcutaneous edema was noted. The brachial artery was noted to be patent to the level of the anastomosis. A puncture site was planned near the graft arterial anastomosis. Subdermal Local anesthesia was administered 1% lidocaine. A small skin nick was made. Under direct ultrasound visualization, a 21 gauge micropuncture needle was directed into the thrombus. A Nitrex wire was then introduced which traveled approximately 10 cm into the graft but met significant resistance. The needle was removed and a micropuncture sheath was inserted. Through the micropuncture sheath, a Wholey wire and a stiff angled glidewire were unable to traverse the on the initial 10 cm from the puncture site. Over the [facility] wire, a 6 French sheath was placed for increased ability in hopes of possible recanalization. Despite these efforts, a Glidewire through a Kumpe the catheter as well as the [facility] wire were unable to recanalized the thrombosed graft. Declot was aborted and attention was then turned to placement of a tunneled hemodialysis catheter. After creating a small venotomy incision, a 21 gauge micropuncture kit was utilized to access the internal jugular vein. Real-time ultrasound guidance was utilized for vascular access including the acquisition of a permanent ultrasound image documenting patency of the accessed vessel. A Wholey wire was advanced to the level of the IVC and the micropuncture sheath was exchanged for an 8 Fr dilator. A 14.5 French tunneled hemodialysis catheter measuring 23 cm from tip to cuff was tunneled  in a retrograde fashion from the anterior chest wall to the venotomy incision. Serial dilation was then performed an a peel-away sheath was  placed. The catheter was then placed through the peel-away sheath with the catheter tip ultimately positioned within the right atrium. Final catheter positioning was confirmed and documented with a spot radiographic image. The catheter aspirates and flushes normally. The catheter was flushed with appropriate volume heparin dwells. The catheter exit site was secured with a 0-Silk retention suture. The venotomy incision was closed with Dermabond. Sterile dressings were applied. The patient tolerated the procedure well without immediate post procedural complication. IMPRESSION: 1. Unsuccessful right upper extremity arteriovenous graft declot. Thrombus burden was too chronic and hardened for catheter and wire recanalization. 2. Successful placement of 23 cm tip to cuff tunneled hemodialysis catheter via the right internal jugular vein with catheter tip terminating within the right atrium. The catheter is ready for immediate use. Ruthann Cancer, MD [facility] Radiology Electronically Signed   By: Ruthann Cancer MD   On: 10/16/2020 13:34   IR US Guide Vasc Access Right  Result Date: 10/16/2020 INDICATION: 74 year old male with history of right upper arm arteriovenous graft for hemodialysis presenting with 11 days of graft occlusion for possible recanalization. EXAM: 1. Ultrasound-guided access of right upper extremity hemodialysis graft. 2. Ultrasound and fluoroscopic guided placement of tunneled hemodialysis catheter. MEDICATIONS: Ancef 2 gm IV . The antibiotic was given in an appropriate time interval prior to skin puncture. ANESTHESIA/SEDATION: Moderate (conscious) sedation was employed during this procedure. A total of Versed 2 mg and Fentanyl 75 mcg was administered intravenously. Moderate Sedation Time: 56 minutes. [name] level of consciousness and vital signs were monitored continuously by radiology nursing throughout the procedure under my direct  supervision. FLUOROSCOPY TIME:  Four minutes 6 seconds (8 mGy). COMPLICATIONS: None immediate. PROCEDURE: Informed written consent was obtained from the patient's legal custodian after a discussion of the risks, benefits, and alternatives to treatment. Questions regarding the procedure were encouraged and answered. The right neck, chest, and arm were prepped with chlorhexidine in a sterile fashion, and a sterile drape was applied covering the operative field. Maximum barrier sterile technique with sterile gowns and gloves were used for the procedure. A timeout was performed prior to the initiation of the procedure. Ultrasound evaluation was performed of the right upper extremity graft. There was dense echogenic material occluding the entirety of the graft. Mild perigraft subcutaneous edema was noted. The brachial artery was noted to be patent to the level of the anastomosis. A puncture site was planned near the graft arterial anastomosis. Subdermal Local anesthesia was administered 1% lidocaine. A small skin nick was made. Under direct ultrasound visualization, a 21 gauge micropuncture needle was directed into the thrombus. A Nitrex wire was then introduced which traveled approximately 10 cm into the graft but met significant resistance. The needle was removed and a micropuncture sheath was inserted. Through the micropuncture sheath, a Wholey wire and a stiff angled glidewire were unable to traverse the on the initial 10 cm from the puncture site. Over the [facility] wire, a 6 French sheath was placed for increased ability in hopes of possible recanalization. Despite these efforts, a Glidewire through a [place] the catheter as well as the [facility] wire were unable to recanalized the thrombosed graft. Declot was aborted and attention was then turned to placement of a tunneled hemodialysis catheter. After creating a small venotomy incision, a 21 gauge micropuncture kit was utilized to access the  internal jugular vein.  Real-time ultrasound guidance was utilized for vascular access including the acquisition of a permanent ultrasound image documenting patency of the accessed vessel. A Wholey wire was advanced to the level of the IVC and the micropuncture sheath was exchanged for an 8 Fr dilator. A 14.5 French tunneled hemodialysis catheter measuring 23 cm from tip to cuff was tunneled in a retrograde fashion from the anterior chest wall to the venotomy incision. Serial dilation was then performed an a peel-away sheath was placed. The catheter was then placed through the peel-away sheath with the catheter tip ultimately positioned within the right atrium. Final catheter positioning was confirmed and documented with a spot radiographic image. The catheter aspirates and flushes normally. The catheter was flushed with appropriate volume heparin dwells. The catheter exit site was secured with a 0-Silk retention suture. The venotomy incision was closed with Dermabond. Sterile dressings were applied. The patient tolerated the procedure well without immediate post procedural complication. IMPRESSION: 1. Unsuccessful right upper extremity arteriovenous graft declot. Thrombus burden was too chronic and hardened for catheter and wire recanalization. 2. Successful placement of 23 cm tip to cuff tunneled hemodialysis catheter via the right internal jugular vein with catheter tip terminating within the right atrium. The catheter is ready for immediate use. Ruthann Cancer, MD [facility] Radiology Electronically Signed   By: Ruthann Cancer MD   On: 10/16/2020 13:34    Procedures Procedures (including critical care time)  Medications Ordered in ED Medications  sodium chloride 0.9 % bolus 250 mL (0 mLs Intravenous Stopped 10/17/20 1049)    ED Course  I have reviewed the triage vital signs and the nursing notes.  Pertinent labs & imaging results that were available during my care of the  patient were reviewed by me and considered in my medical decision making (see chart for details).    MDM Rules/Calculators/A&P                          CC: Hypotension/near syncope VS:  Vitals:   10/17/20 1230 10/17/20 1300 10/17/20 1330 10/17/20 1348  BP: 104/72 101/70 101/72 101/72  Pulse: 85 88 88 89  Resp: 10 (!) '9 14 16  ' Temp:    98.8 F (37.1 C)  TempSrc:    Oral  SpO2: 99% 99% 100% 100%  Weight:      Height:        LY:HTMBPJP is gathered by EMS/EMR and nursing staff at patient's long-term care facility. Previous records obtained and reviewed. DDX:[name] complaint of near syncope involves an extensive number of diagnostic and treatment options, and is a complaint that carries with it a high risk of complications, morbidity, and potential mortality. Given the large differential diagnosis, medical decision making is of high complexity. The differential for syncope is extensive and includes, but is not limited to: arrythmia (Vtach, SVT, SSS, sinus arrest, AV block, bradycardia) aortic stenosis, AMI, HOCM, PE, atrial myxoma, pulmonary hypertension, orthostatic hypotension, (hypovolemia, drug effect, GB syndrome, micturition, cough, swall) carotid sinus sensitivity, Seizure, TIA/CVA, hypoglycemia,  Vertigo. Labs: I ordered reviewed and interpreted labs which include CBC which shows stable macrocytic anemia, mild thrombocytopenia of insignificant value BMP shows elevated creatinine and mildly elevated BUN consistent with her recent inability to access dialysis and only partial treatment on end-stage renal disease, magnesium within normal limits, Troponin is negative. Imaging:  EKG: shows sinus rhythm at a rate of 93 Consults: MDM: 74 year old male with  a history of MR, end-stage renal disease with hypotension today at hemodialysis.  This episode apparently something that happens very frequently has a soft baseline pressure.  Patient given about 250 mL of fluids.  I have reviewed  labs, and EKG.  He has no complaints at this time.  Patient does not appear to have evidence of severe electrolyte abnormality and given his longstanding history of the same I suspect there is no arrhythmia as the cause.  Not appear to have ACS as a cause of his hypertension or diaphoresis.  He is afebrile and hemodynamically stable.  Case reviewed with Dr. Roderic Palau who is in agreement with work-up and plan for discharge at this time. Patient disposition:The patient appears reasonably screened and/or stabilized for discharge and I doubt any other medical condition or other [facility] requiring further screening, evaluation, or treatment in the ED at this time prior to discharge. I have discussed lab and/or imaging findings with the patient and answered all questions/concerns to the best of my ability.I have discussed return precautions and OP follow up.    Final Clinical Impression(s) / ED Diagnoses Final diagnoses:  Hypotension, unspecified hypotension type  Near syncope    Rx / DC Orders ED Discharge Orders    None       Margarita Mail, PA-C 10/17/20 Barth Kirks, MD 10/19/20 1511

## 2020-10-17 NOTE — Discharge Instructions (Addendum)
Get help right away if you: Have chest pain. Have a fast or irregular heartbeat. Develop numbness in any part of your body. Cannot move your arms or your legs. Have trouble speaking. Become sweaty or feel light-headed. Faint. Feel short of breath. Have trouble staying awake. Feel confused. 

## 2020-10-17 NOTE — ED Notes (Signed)
Refused IV stick x3

## 2020-10-17 NOTE — ED Triage Notes (Addendum)
Per EMS, pt was at dialysis and received half a treatment of dialysis and had approximately 2.5 liters pulled off. Per [facility], pt dialysis shunt clotted off 2-3 days ago. Pt had PICC placed for dialysis, patient has history of low bp and similar incidents during dialysis. Pt alert and able to answer questions and follow most commands but does have a hx of MR. EMS reports was sent over for evaluation related to hypotension. Pt alert and oriented. Denies pain. PA at bedside.

## 2020-10-18 ENCOUNTER — Non-Acute Institutional Stay (SKILLED_NURSING_FACILITY): Payer: Medicare Other | Admitting: Adult Health

## 2020-10-18 ENCOUNTER — Encounter: Payer: Self-pay | Admitting: Adult Health

## 2020-10-18 DIAGNOSIS — I951 Orthostatic hypotension: Secondary | ICD-10-CM | POA: Diagnosis not present

## 2020-10-18 DIAGNOSIS — Z992 Dependence on renal dialysis: Secondary | ICD-10-CM

## 2020-10-18 DIAGNOSIS — E1122 Type 2 diabetes mellitus with diabetic chronic kidney disease: Secondary | ICD-10-CM | POA: Diagnosis not present

## 2020-10-18 DIAGNOSIS — N186 End stage renal disease: Secondary | ICD-10-CM

## 2020-10-18 NOTE — Progress Notes (Signed)
Location:  [place] Room Number: 112-D Place of Service:  SNF (31)   CODE STATUS: Full Code  Allergies  Allergen Reactions  . No Known Allergies     Chief Complaint  Patient presents with  . Acute Visit    ED follow-up     HPI:  He was at dialysis and had low blood pressure while on the machine and was taken to the ED for further evaluation. He was given 500 cc of fluids and has returned back to the facility. He denies any weakness; no dizziness; no chest pain or shortness of breath.   Past Medical History:  Diagnosis Date  . Abnormal CT scan, kidney 10/06/2011  . Acute pyelonephritis 10/07/2011  . Anemia    normocytic  . Anxiety    mental retardation  . Bladder wall thickening 10/06/2011  . BPH (benign prostatic hypertrophy)   . Diabetes mellitus   . Dialysis patient [facility])    Tuesday, Thursday and Saturday,   . DVT of leg (deep venous thrombosis) ([place]) 12/25/2016  . Edema     history of lower extremity edema  . GERD (gastroesophageal reflux disease)   . Heme positive stool   . Hydronephrosis   . Hyperkalemia   . Hyperlipidemia   . Hypernatremia   . Hypertension   . Hypothyroidism   . Impaired speech   . 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 MR (mental retardation)   . Muscle weakness   . Obstructive uropathy   . 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 Poor historian poor historian  . Protein calorie malnutrition ([place])   . Pyelonephritis   . Renal failure (ARF), acute on chronic (HCC)   . Renal insufficiency    chronic history  . Sepsis ([place])   . Smoking   . Uremia   . Urinary retention   . UTI (lower urinary tract infection) 10/06/2011    Past Surgical History:  Procedure Laterality Date  . A/V FISTULAGRAM N/A 08/13/2018   Procedure: A/V FISTULAGRAM - Right Upper;  Surgeon: Elam Dutch, MD;  Location: [place] CV LAB;  Service: Cardiovascular;  Laterality: N/A;  . A/V FISTULAGRAM N/A 11/22/2018   Procedure: A/V  FISTULAGRAM - Right Upper;  Surgeon: Waynetta Sandy, MD;  Location: [place] CV LAB;  Service: Cardiovascular;  Laterality: N/A;  . AV FISTULA PLACEMENT Left 07/06/2015   Procedure:  INSERTION LEFT ARM ARTERIOVENOUS GORTEX GRAFT;  Surgeon: Angelia Mould, MD;  Location: [place];  Service: Vascular;  Laterality: Left;  . AV FISTULA PLACEMENT Right 02/26/2016   Procedure: ARTERIOVENOUS (AV) FISTULA CREATION ;  Surgeon: Angelia Mould, MD;  Location: [place];  Service: Vascular;  Laterality: Right;  . AV FISTULA PLACEMENT Right 11/25/2018   Procedure: INSERTION OF ARTERIOVENOUS (AV) ARTEGRAFT RIGHT UPPER ARM;  Surgeon: Waynetta Sandy, MD;  Location: [place];  Service: Vascular;  Laterality: Right;  . [place] REMOVAL Left 10/09/2015   Procedure: REMOVAL OF ARTERIOVENOUS GORETEX GRAFT ([place]) Evacuation of Lymphocele, Vein Patch angioplasty of brachial artery.;  Surgeon: Angelia Mould, MD;  Location: [facility];  Service: Vascular;  Laterality: Left;  . BASCILIC VEIN TRANSPOSITION Right 02/26/2016   Procedure: Right BASCILIC VEIN TRANSPOSITION;  Surgeon: Angelia Mould, MD;  Location: [place];  Service: Vascular;  Laterality: Right;  . CIRCUMCISION N/A 01/04/2014   Procedure: CIRCUMCISION ADULT (procedure #1);  Surgeon: Marissa Nestle, MD;  Location: [facility];  Service: Urology;  Laterality: N/A;  .  COLECTOMY N/A 05/04/2017   Procedure: TOTAL COLECTOMY;  Surgeon: Aviva Signs, MD;  Location: [facility];  Service: General;  Laterality: N/A;  . COLONOSCOPY N/A 04/27/2017   Procedure: COLONOSCOPY;  Surgeon: Daneil Dolin, MD;  [facility];  Service: Endoscopy;  Laterality: N/A;  245  . CYSTOSCOPY W/ RETROGRADES Bilateral 06/29/2015   Procedure: CYSTOSCOPY, DILATION OF URETHRAL STRICTURE WITH BILATERAL RETROGRADE PYELOGRAM,SUPRAPUBIC TUBE CHANGE;  Surgeon: Festus Aloe, MD;  Location: [facility];  Service: Urology;  Laterality: Bilateral;  . CYSTOSCOPY WITH URETHRAL  DILATATION N/A 12/29/2013   Procedure: CYSTOSCOPY WITH URETHRAL DILATATION;  Surgeon: Marissa Nestle, MD;  [facility];  Service: Urology;  Laterality: N/A;  . ESOPHAGOGASTRODUODENOSCOPY N/A 04/27/2017   Procedure: ESOPHAGOGASTRODUODENOSCOPY (EGD);  Surgeon: Daneil Dolin, MD;  [facility];  Service: Endoscopy;  Laterality: N/A;  . INSERTION OF DIALYSIS CATHETER Right 11/25/2018   Procedure: INSERTION OF DIALYSIS CATHETER RIGHT INTERNAL JUGULAR;  Surgeon: Waynetta Sandy, MD;  Location: [place];  Service: Vascular;  Laterality: Right;  . IR AV DIALY SHUNT INTRO [place] W/PTA/IMG RIGHT Right 09/07/2018  . IR FLUORO GUIDE CV LINE RIGHT  10/16/2020  . IR REMOVAL TUN CV CATH W/O FL  01/12/2019  . IR THROMBECTOMY AV FISTULA W/THROMBOLYSIS/PTA INC/SHUNT/IMG RIGHT Right 04/26/2018  . IR US GUIDE VASC ACCESS RIGHT  04/26/2018  . IR US GUIDE VASC ACCESS RIGHT  09/07/2018  . IR US GUIDE VASC ACCESS RIGHT  10/16/2020  . IR US GUIDE VASC ACCESS RIGHT  10/16/2020  . ORIF FEMUR FRACTURE Right 11/22/2016   Procedure: OPEN REDUCTION INTERNAL FIXATION (ORIF) DISTAL FEMUR FRACTURE;  Surgeon: Rod Can, MD;  Location: [place];  Service: Orthopedics;  Laterality: Right;  . PATCH ANGIOPLASTY Right 12/10/2017   Procedure: PATCH ANGIOPLASTY;  Surgeon: Angelia Mould, MD;  Location: [facility];  Service: Vascular;  Laterality: Right;  . PERIPHERAL VASCULAR BALLOON ANGIOPLASTY  08/13/2018   Procedure: PERIPHERAL VASCULAR BALLOON ANGIOPLASTY;  Surgeon: Elam Dutch, MD;  Location: [place] [facility] LAB;  Service: Cardiovascular;;  right AV fistula   . PERIPHERAL VASCULAR BALLOON ANGIOPLASTY  11/22/2018   Procedure: PERIPHERAL VASCULAR BALLOON ANGIOPLASTY;  Surgeon: Waynetta Sandy, MD;  Location: [place] [facility];  Service: Cardiovascular;;  rt AV fistula  . PERIPHERAL VASCULAR CATHETERIZATION N/A 10/08/2015   Procedure: A/V Shuntogram;  Surgeon: Angelia Mould, MD;  Location: [place] [facility];  Service: Cardiovascular;  Laterality: N/A;  . THROMBECTOMY W/ EMBOLECTOMY Right 12/10/2017   Procedure: THROMBECTOMY REVISION RIGHT ARM  ARTERIOVENOUS FISTULA;  Surgeon: Angelia Mould, MD;  Location: [place];  Service: Vascular;  Laterality: Right;  . TRANSURETHRAL RESECTION OF PROSTATE N/A 01/04/2014   Procedure: TRANSURETHRAL RESECTION OF THE PROSTATE (TURP) (procedure #2);  Surgeon: Marissa Nestle, MD;  [facility];  Service: Urology;  Laterality: N/A;    Social History   Socioeconomic History  . Marital status: Single    Spouse name: Not on file  . Number of children: Not on file  . Years of education: Not on file  . Highest education level: Not on file  Occupational History  . Occupation: retired   Tobacco Use  . Smoking status: Never Smoker  . Smokeless tobacco: Never Used  Vaping Use  . Vaping Use: Never used  Substance and Sexual Activity  . Alcohol use: No  . Drug use: No  . Sexual activity: Not Currently  Other Topics Concern  . 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   [facility] term resident of [facility]    Social Determinants of Health   Financial Resource Strain: Not on file  Food Insecurity: Not on file  Transportation Needs: Not on file  Physical Activity: Not on file  Stress: Not on file  Social Connections: Not on file  Intimate Partner Violence: Not on file   Family History  Problem Relation Age of Onset  . Cancer Mother   . Colon cancer Neg Hx       VITAL SIGNS BP (!) 100/58   Pulse 87   Resp 18   Ht 5\' 8"  (1.727 m)   Wt 221 lb 12.8 oz (100.6 kg)   SpO2 100%   BMI 33.72 kg/m   Outpatient Encounter Medications as of 10/18/2020  Medication Sig  . Amino Acids-Protein Hydrolys (FEEDING SUPPLEMENT, PRO-STAT 64,) LIQD Take 30 mLs by mouth 2 (two) times daily between meals.  Marland Kitchen atorvastatin (LIPITOR) 80 MG tablet Take 80  mg by mouth every evening.  . insulin aspart (NOVOLOG FLEXPEN) 100 UNIT/ML FlexPen Inject 20 Units into the skin daily. If cbg>150; subcutaneous  . insulin aspart (NOVOLOG) 100 UNIT/ML FlexPen Inject 15 Units into the skin 2 (two) times daily. Hold insulin on dialysis days if cbg's less than 150 on mon,wed,fri  . Insulin Glargine (BASAGLAR KWIKPEN) 100 UNIT/ML SOPN Inject 45 Units into the skin at bedtime.   Marland Kitchen JANUVIA 25 MG tablet Take 25 mg by mouth daily.   Marland Kitchen levothyroxine (SYNTHROID, LEVOTHROID) 88 MCG tablet Take 88 mcg by mouth daily before breakfast.  . midodrine (PROAMATINE) 10 MG tablet Take 10 mg by mouth. Send med with resident on Mon/Wed/Fri to dialysis. do not give at [facility] give to resident to take with him. send with 5mg  to equal 15mg .  . midodrine (PROAMATINE) 5 MG tablet Take 5 mg by mouth. Send with resident on dialysis days.  Mon, Wed, Fri.  Take along with 10 mg to equal 15 mg  . NON FORMULARY 1200cc fluid restrictions. Dietary to provide 400cc/24hrs Nursing to give 7-3=140cc/24hrs 3-11=560cc/24hrs 11-7=100cc/24 Special Instructions: Document amount of fluids given each shift. Every Shift Day, Evening, Night  . NON FORMULARY Diet Type:  NAS, Cons CHO Diet  . omega-3 acid ethyl esters (LOVAZA) 1 g capsule Take 2 g by mouth at bedtime.  Marland Kitchen omeprazole (PRILOSEC) 40 MG capsule Take 40 mg by mouth daily.   . polyethylene glycol (MIRALAX / GLYCOLAX) packet Take 17 g by mouth daily as needed for mild constipation.   . sevelamer carbonate (RENVELA) 800 MG tablet Take 800 mg by mouth 3 (three) times daily with meals. 9 am, 12 pm, and 5 pm  . tamsulosin (FLOMAX) 0.4 MG CAPS capsule Take 0.4 mg by mouth every evening. Give 30 minutes after a meal  . torsemide (DEMADEX) 10 MG tablet Take 5 tablets (50 mg total) by mouth daily.  . [DISCONTINUED] sevelamer carbonate (RENVELA) 800 MG tablet Take 800 mg by mouth every Monday, Wednesday, and Friday.    No facility-administered encounter  medications on file as of 10/18/2020.     SIGNIFICANT DIAGNOSTIC EXAMS   LABS REVIEWED PREVIOUS;  12-09-19: hgb a1c 6.9; chol 94; ldl 4 trig 339; hdl 22 tsh 3.202 04-09-20: wbc 6.2; hgb 10.7; hct 33.5; mcv 109.1 plt 144;glucose 185; bun 43; creat 10.75; k+ 4.3; na++ 137; ca 8.7 liver normal albumin 3.7 hgb a1c 7.5 07-04-20: PSA: 2.79  07-19-20: hgb a1c 7.7 09-10-20: wbc 11.8; hgb 12.9; hct 40.4; mcv 108.6 plt 163; glucose 225; bun 30;creat 8.92; k+ 3.8; na++ 138; ca 9.2  TODAY  10-17-20: wbc 8.2; hgb 11.0; hct 34.3; mcv 108.2 plt 121; glucose 141; bun 51; creat 13.25; k+ 3.9; na++ 138; ca 8.5 mag 2.1    Review of Systems  Constitutional: Negative for malaise/fatigue.  Respiratory: Negative for cough and shortness of breath.   Cardiovascular: Negative for chest pain, palpitations and leg swelling.  Gastrointestinal: Negative for abdominal pain, constipation and heartburn.  Musculoskeletal: Negative for back pain, joint pain and myalgias.  Skin: Negative.   Neurological: Negative for dizziness.  Psychiatric/Behavioral: The patient is not nervous/anxious.       Physical Exam Constitutional:      General: He is not in acute distress.    Appearance: He is well-developed and well-nourished. He is not diaphoretic.  Neck:     Thyroid: No thyromegaly.  Cardiovascular:     Rate and Rhythm: Normal rate and regular rhythm.     Pulses: Normal pulses and intact distal pulses.     Heart sounds: Normal heart sounds.  Pulmonary:     Effort: Pulmonary effort is normal. No respiratory distress.     Breath sounds: Normal breath sounds.  Abdominal:     General: Bowel sounds are normal. There is no distension.     Palpations: Abdomen is soft.     Tenderness: There is no abdominal tenderness.  Genitourinary:    Comments: History turp  Musculoskeletal:        General: No edema.     Cervical back: Neck supple.     Right lower leg: No edema.     Left lower leg: No edema.     Comments: Is able  to move all extremities History of right femur ORIF                 Lymphadenopathy:     Cervical: No cervical adenopathy.  Skin:    General: Skin is warm and dry.     Comments: Right upper extremity a/v fistula + thrill           Neurological:     Mental Status: He is alert. Mental status is at baseline.  Psychiatric:        Mood and Affect: Mood and affect and mood normal.     ASSESSMENT/ PLAN:  TODAY  1. Dependence on dialysis 2. End stage renal disease on hemodialysis due to type 2 diabetes mellitus 3. Orthostatic hypotension:   He is presently stable will continue dialysis three days weekly Will continue to monitor his status.    MD is aware of resident's narcotic use and is in agreement with current plan of care. We will attempt to wean resident as appropriate.  Ok Edwards NP [facility]  Contact [phone] Monday through Friday 8am- 5pm  After hours call [phone]

## 2020-10-19 DIAGNOSIS — N2581 Secondary hyperparathyroidism of renal origin: Secondary | ICD-10-CM | POA: Diagnosis not present

## 2020-10-19 DIAGNOSIS — Z992 Dependence on renal dialysis: Secondary | ICD-10-CM | POA: Diagnosis not present

## 2020-10-19 DIAGNOSIS — N186 End stage renal disease: Secondary | ICD-10-CM | POA: Diagnosis not present

## 2020-10-19 DIAGNOSIS — D509 Iron deficiency anemia, unspecified: Secondary | ICD-10-CM | POA: Diagnosis not present

## 2020-10-19 DIAGNOSIS — D631 Anemia in chronic kidney disease: Secondary | ICD-10-CM | POA: Diagnosis not present

## 2020-10-22 DIAGNOSIS — Z992 Dependence on renal dialysis: Secondary | ICD-10-CM | POA: Diagnosis not present

## 2020-10-22 DIAGNOSIS — N186 End stage renal disease: Secondary | ICD-10-CM | POA: Diagnosis not present

## 2020-10-22 DIAGNOSIS — D631 Anemia in chronic kidney disease: Secondary | ICD-10-CM | POA: Diagnosis not present

## 2020-10-22 DIAGNOSIS — D509 Iron deficiency anemia, unspecified: Secondary | ICD-10-CM | POA: Diagnosis not present

## 2020-10-22 DIAGNOSIS — N2581 Secondary hyperparathyroidism of renal origin: Secondary | ICD-10-CM | POA: Diagnosis not present

## 2020-10-24 DIAGNOSIS — Z992 Dependence on renal dialysis: Secondary | ICD-10-CM | POA: Diagnosis not present

## 2020-10-24 DIAGNOSIS — D509 Iron deficiency anemia, unspecified: Secondary | ICD-10-CM | POA: Diagnosis not present

## 2020-10-24 DIAGNOSIS — I120 Hypertensive chronic kidney disease with stage 5 chronic kidney disease or end stage renal disease: Secondary | ICD-10-CM | POA: Diagnosis not present

## 2020-10-24 DIAGNOSIS — E1129 Type 2 diabetes mellitus with other diabetic kidney complication: Secondary | ICD-10-CM | POA: Diagnosis not present

## 2020-10-24 DIAGNOSIS — N2581 Secondary hyperparathyroidism of renal origin: Secondary | ICD-10-CM | POA: Diagnosis not present

## 2020-10-24 DIAGNOSIS — Z1159 Encounter for screening for other viral diseases: Secondary | ICD-10-CM | POA: Diagnosis not present

## 2020-10-24 DIAGNOSIS — N186 End stage renal disease: Secondary | ICD-10-CM | POA: Diagnosis not present

## 2020-10-24 DIAGNOSIS — D631 Anemia in chronic kidney disease: Secondary | ICD-10-CM | POA: Diagnosis not present

## 2020-10-26 DIAGNOSIS — N186 End stage renal disease: Secondary | ICD-10-CM | POA: Diagnosis not present

## 2020-10-26 DIAGNOSIS — Z992 Dependence on renal dialysis: Secondary | ICD-10-CM | POA: Diagnosis not present

## 2020-10-26 DIAGNOSIS — D509 Iron deficiency anemia, unspecified: Secondary | ICD-10-CM | POA: Diagnosis not present

## 2020-10-26 DIAGNOSIS — N2581 Secondary hyperparathyroidism of renal origin: Secondary | ICD-10-CM | POA: Diagnosis not present

## 2020-10-26 DIAGNOSIS — D631 Anemia in chronic kidney disease: Secondary | ICD-10-CM | POA: Diagnosis not present

## 2020-10-29 DIAGNOSIS — I120 Hypertensive chronic kidney disease with stage 5 chronic kidney disease or end stage renal disease: Secondary | ICD-10-CM | POA: Diagnosis not present

## 2020-10-29 DIAGNOSIS — Z1159 Encounter for screening for other viral diseases: Secondary | ICD-10-CM | POA: Diagnosis not present

## 2020-10-29 DIAGNOSIS — E1129 Type 2 diabetes mellitus with other diabetic kidney complication: Secondary | ICD-10-CM | POA: Diagnosis not present

## 2020-10-30 DIAGNOSIS — Z992 Dependence on renal dialysis: Secondary | ICD-10-CM | POA: Diagnosis not present

## 2020-10-30 DIAGNOSIS — D509 Iron deficiency anemia, unspecified: Secondary | ICD-10-CM | POA: Diagnosis not present

## 2020-10-30 DIAGNOSIS — D631 Anemia in chronic kidney disease: Secondary | ICD-10-CM | POA: Diagnosis not present

## 2020-10-30 DIAGNOSIS — N2581 Secondary hyperparathyroidism of renal origin: Secondary | ICD-10-CM | POA: Diagnosis not present

## 2020-10-30 DIAGNOSIS — N186 End stage renal disease: Secondary | ICD-10-CM | POA: Diagnosis not present

## 2020-10-31 DIAGNOSIS — D509 Iron deficiency anemia, unspecified: Secondary | ICD-10-CM | POA: Diagnosis not present

## 2020-10-31 DIAGNOSIS — Z992 Dependence on renal dialysis: Secondary | ICD-10-CM | POA: Diagnosis not present

## 2020-10-31 DIAGNOSIS — N186 End stage renal disease: Secondary | ICD-10-CM | POA: Diagnosis not present

## 2020-10-31 DIAGNOSIS — D631 Anemia in chronic kidney disease: Secondary | ICD-10-CM | POA: Diagnosis not present

## 2020-10-31 DIAGNOSIS — N2581 Secondary hyperparathyroidism of renal origin: Secondary | ICD-10-CM | POA: Diagnosis not present

## 2020-11-02 DIAGNOSIS — D509 Iron deficiency anemia, unspecified: Secondary | ICD-10-CM | POA: Diagnosis not present

## 2020-11-02 DIAGNOSIS — Z992 Dependence on renal dialysis: Secondary | ICD-10-CM | POA: Diagnosis not present

## 2020-11-02 DIAGNOSIS — N186 End stage renal disease: Secondary | ICD-10-CM | POA: Diagnosis not present

## 2020-11-02 DIAGNOSIS — D631 Anemia in chronic kidney disease: Secondary | ICD-10-CM | POA: Diagnosis not present

## 2020-11-02 DIAGNOSIS — N2581 Secondary hyperparathyroidism of renal origin: Secondary | ICD-10-CM | POA: Diagnosis not present

## 2020-11-05 DIAGNOSIS — D509 Iron deficiency anemia, unspecified: Secondary | ICD-10-CM | POA: Diagnosis not present

## 2020-11-05 DIAGNOSIS — D631 Anemia in chronic kidney disease: Secondary | ICD-10-CM | POA: Diagnosis not present

## 2020-11-05 DIAGNOSIS — N2581 Secondary hyperparathyroidism of renal origin: Secondary | ICD-10-CM | POA: Diagnosis not present

## 2020-11-05 DIAGNOSIS — N186 End stage renal disease: Secondary | ICD-10-CM | POA: Diagnosis not present

## 2020-11-05 DIAGNOSIS — E119 Type 2 diabetes mellitus without complications: Secondary | ICD-10-CM | POA: Diagnosis not present

## 2020-11-05 DIAGNOSIS — Z992 Dependence on renal dialysis: Secondary | ICD-10-CM | POA: Diagnosis not present

## 2020-11-07 DIAGNOSIS — D509 Iron deficiency anemia, unspecified: Secondary | ICD-10-CM | POA: Diagnosis not present

## 2020-11-07 DIAGNOSIS — Z992 Dependence on renal dialysis: Secondary | ICD-10-CM | POA: Diagnosis not present

## 2020-11-07 DIAGNOSIS — N186 End stage renal disease: Secondary | ICD-10-CM | POA: Diagnosis not present

## 2020-11-07 DIAGNOSIS — D631 Anemia in chronic kidney disease: Secondary | ICD-10-CM | POA: Diagnosis not present

## 2020-11-07 DIAGNOSIS — N2581 Secondary hyperparathyroidism of renal origin: Secondary | ICD-10-CM | POA: Diagnosis not present

## 2020-11-07 DIAGNOSIS — Z1159 Encounter for screening for other viral diseases: Secondary | ICD-10-CM | POA: Diagnosis not present

## 2020-11-07 DIAGNOSIS — I120 Hypertensive chronic kidney disease with stage 5 chronic kidney disease or end stage renal disease: Secondary | ICD-10-CM | POA: Diagnosis not present

## 2020-11-07 DIAGNOSIS — E1129 Type 2 diabetes mellitus with other diabetic kidney complication: Secondary | ICD-10-CM | POA: Diagnosis not present

## 2020-11-09 DIAGNOSIS — D509 Iron deficiency anemia, unspecified: Secondary | ICD-10-CM | POA: Diagnosis not present

## 2020-11-09 DIAGNOSIS — N2581 Secondary hyperparathyroidism of renal origin: Secondary | ICD-10-CM | POA: Diagnosis not present

## 2020-11-09 DIAGNOSIS — D631 Anemia in chronic kidney disease: Secondary | ICD-10-CM | POA: Diagnosis not present

## 2020-11-09 DIAGNOSIS — Z992 Dependence on renal dialysis: Secondary | ICD-10-CM | POA: Diagnosis not present

## 2020-11-09 DIAGNOSIS — N186 End stage renal disease: Secondary | ICD-10-CM | POA: Diagnosis not present

## 2020-11-13 ENCOUNTER — Non-Acute Institutional Stay (SKILLED_NURSING_FACILITY): Payer: Medicare Other | Admitting: Adult Health

## 2020-11-13 ENCOUNTER — Encounter: Payer: Self-pay | Admitting: Adult Health

## 2020-11-13 DIAGNOSIS — I120 Hypertensive chronic kidney disease with stage 5 chronic kidney disease or end stage renal disease: Secondary | ICD-10-CM

## 2020-11-13 DIAGNOSIS — D631 Anemia in chronic kidney disease: Secondary | ICD-10-CM | POA: Diagnosis not present

## 2020-11-13 DIAGNOSIS — E1122 Type 2 diabetes mellitus with diabetic chronic kidney disease: Secondary | ICD-10-CM | POA: Diagnosis not present

## 2020-11-13 DIAGNOSIS — E1151 Type 2 diabetes mellitus with diabetic peripheral angiopathy without gangrene: Secondary | ICD-10-CM | POA: Diagnosis not present

## 2020-11-13 DIAGNOSIS — N186 End stage renal disease: Secondary | ICD-10-CM

## 2020-11-13 DIAGNOSIS — Z992 Dependence on renal dialysis: Secondary | ICD-10-CM

## 2020-11-13 DIAGNOSIS — F321 Major depressive disorder, single episode, moderate: Secondary | ICD-10-CM | POA: Diagnosis not present

## 2020-11-13 DIAGNOSIS — L602 Onychogryphosis: Secondary | ICD-10-CM | POA: Diagnosis not present

## 2020-11-13 DIAGNOSIS — L603 Nail dystrophy: Secondary | ICD-10-CM | POA: Diagnosis not present

## 2020-11-13 NOTE — Progress Notes (Signed)
Location:  [facility] Room Number: 112-D Place of Service:  SNF (31)   CODE STATUS: Full Code  Allergies  Allergen Reactions  . No Known Allergies     Chief Complaint  Patient presents with  . 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 There are no reports of uncontrolled pain. He continues with dialysis three days weekly. Denies any changes in appetite; no insomnia no depressive thoughts.    Past Medical History:  Diagnosis Date  . Abnormal CT scan, kidney 10/06/2011  . Acute pyelonephritis 10/07/2011  . Anemia    normocytic  . Anxiety    mental retardation  . Bladder wall thickening 10/06/2011  . BPH (benign prostatic hypertrophy)   . Diabetes mellitus   . Dialysis patient [facility])    Tuesday, Thursday and Saturday,   . DVT of leg (deep venous thrombosis) ([place]) 12/25/2016  . Edema     history of lower extremity edema  . GERD (gastroesophageal reflux disease)   . Heme positive stool   . Hydronephrosis   . Hyperkalemia   . Hyperlipidemia   . Hypernatremia   . Hypertension   . Hypothyroidism   . Impaired speech   . 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 MR (mental retardation)   . Muscle weakness   . Obstructive uropathy   . 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 Poor historian poor historian  . Protein calorie malnutrition ([place])   . Pyelonephritis   . Renal failure (ARF), acute on chronic (HCC)   . Renal insufficiency    chronic history  . Sepsis ([place])   . Smoking   . Uremia   .  Urinary retention   . UTI (lower urinary tract infection) 10/06/2011    Past Surgical History:  Procedure Laterality Date  . A/V FISTULAGRAM N/A 08/13/2018   Procedure: A/V FISTULAGRAM - Right Upper;  Surgeon: Elam Dutch, MD;  Location: [place];  Service: Cardiovascular;  Laterality: N/A;  . A/V FISTULAGRAM N/A 11/22/2018   Procedure: A/V FISTULAGRAM - Right Upper;  Surgeon: Waynetta Sandy, MD;  Location: [place];  Service: Cardiovascular;  Laterality: N/A;  . AV FISTULA PLACEMENT Left 07/06/2015   Procedure:  INSERTION LEFT ARM ARTERIOVENOUS GORTEX GRAFT;  Surgeon: Angelia Mould, MD;  Location: [place];  Service: Vascular;  Laterality: Left;  . AV FISTULA PLACEMENT Right 02/26/2016   Procedure: ARTERIOVENOUS (AV) FISTULA CREATION ;  Surgeon: Angelia Mould, MD;  Location: [place];  Service: Vascular;  Laterality: Right;  . AV FISTULA PLACEMENT Right 11/25/2018   Procedure: INSERTION OF ARTERIOVENOUS (AV) ARTEGRAFT RIGHT UPPER ARM;  Surgeon: Waynetta Sandy, MD;  Location: [place];  Service: Vascular;  Laterality: Right;  . [place] REMOVAL Left 10/09/2015   Procedure: REMOVAL OF ARTERIOVENOUS GORETEX GRAFT ([place]) Evacuation of Lymphocele, Vein Patch angioplasty of brachial artery.;  Surgeon: Angelia Mould, MD;  Location: [place];  Service: Vascular;  Laterality: Left;  . BASCILIC VEIN TRANSPOSITION  Right 02/26/2016   Procedure: Right BASCILIC VEIN TRANSPOSITION;  Surgeon: Angelia Mould, MD;  Location: [facility];  Service: Vascular;  Laterality: Right;  . CIRCUMCISION N/A 01/04/2014   Procedure: CIRCUMCISION ADULT (procedure #1);  Surgeon: Marissa Nestle, MD;  Location: [facility];  Service: Urology;  Laterality: N/A;  . COLECTOMY N/A 05/04/2017   Procedure: TOTAL COLECTOMY;  Surgeon: Aviva Signs, MD;  Location: [facility];  Service: General;  Laterality: N/A;  . COLONOSCOPY N/A 04/27/2017   Procedure: COLONOSCOPY;  Surgeon: Daneil Dolin,  MD;  [facility];  Service: Endoscopy;  Laterality: N/A;  245  . CYSTOSCOPY W/ RETROGRADES Bilateral 06/29/2015   Procedure: CYSTOSCOPY, DILATION OF URETHRAL STRICTURE WITH BILATERAL RETROGRADE PYELOGRAM,SUPRAPUBIC TUBE CHANGE;  Surgeon: Festus Aloe, MD;  Location: [facility];  Service: Urology;  Laterality: Bilateral;  . CYSTOSCOPY WITH URETHRAL DILATATION N/A 12/29/2013   Procedure: CYSTOSCOPY WITH URETHRAL DILATATION;  Surgeon: Marissa Nestle, MD;  [facility];  Service: Urology;  Laterality: N/A;  . ESOPHAGOGASTRODUODENOSCOPY N/A 04/27/2017   Procedure: ESOPHAGOGASTRODUODENOSCOPY (EGD);  Surgeon: Daneil Dolin, MD;  [facility];  Service: Endoscopy;  Laterality: N/A;  . INSERTION OF DIALYSIS CATHETER Right 11/25/2018   Procedure: INSERTION OF DIALYSIS CATHETER RIGHT INTERNAL JUGULAR;  Surgeon: Waynetta Sandy, MD;  Location: [place];  Service: Vascular;  Laterality: Right;  . IR AV DIALY SHUNT INTRO [place] W/PTA/IMG RIGHT Right 09/07/2018  . IR FLUORO GUIDE CV LINE RIGHT  10/16/2020  . IR REMOVAL TUN CV CATH W/O FL  01/12/2019  . IR THROMBECTOMY AV FISTULA W/THROMBOLYSIS/PTA INC/SHUNT/IMG RIGHT Right 04/26/2018  . IR US GUIDE VASC ACCESS RIGHT  04/26/2018  . IR US GUIDE VASC ACCESS RIGHT  09/07/2018  . IR US GUIDE VASC ACCESS RIGHT  10/16/2020  . IR US GUIDE VASC ACCESS RIGHT  10/16/2020  . ORIF FEMUR FRACTURE Right 11/22/2016   Procedure: OPEN REDUCTION INTERNAL FIXATION (ORIF) DISTAL FEMUR FRACTURE;  Surgeon: Rod Can, MD;  Location: [facility];  Service: Orthopedics;  Laterality: Right;  . PATCH ANGIOPLASTY Right 12/10/2017   Procedure: PATCH ANGIOPLASTY;  Surgeon: Angelia Mould, MD;  [facility];  Service: Vascular;  Laterality: Right;  . PERIPHERAL VASCULAR BALLOON ANGIOPLASTY  08/13/2018   Procedure: PERIPHERAL VASCULAR BALLOON ANGIOPLASTY;  Surgeon: Elam Dutch, MD;  Location: [facility] [facility];  Service:  Cardiovascular;;  right AV fistula   . PERIPHERAL VASCULAR BALLOON ANGIOPLASTY  11/22/2018   Procedure: PERIPHERAL VASCULAR BALLOON ANGIOPLASTY;  Surgeon: Waynetta Sandy, MD;  Location: [place] [facility];  Service: Cardiovascular;;  rt AV fistula  . PERIPHERAL VASCULAR CATHETERIZATION N/A 10/08/2015   Procedure: A/V Shuntogram;  Surgeon: Angelia Mould, MD;  Location: [place] [facility];  Service: Cardiovascular;  Laterality: N/A;  . THROMBECTOMY W/ EMBOLECTOMY Right 12/10/2017   Procedure: THROMBECTOMY REVISION RIGHT ARM  ARTERIOVENOUS FISTULA;  Surgeon: Angelia Mould, MD;  Location: [place];  Service: Vascular;  Laterality: Right;  . TRANSURETHRAL RESECTION OF PROSTATE N/A 01/04/2014   Procedure: TRANSURETHRAL RESECTION OF THE PROSTATE (TURP) (procedure #2);  Surgeon: Marissa Nestle, MD;  [facility];  Service: Urology;  Laterality: N/A;    Social History   Socioeconomic History  . Marital status: Single    Spouse name: Not on file  . Number of children: Not on file  . Years of education: Not on file  . Highest education level: Not on file  Occupational History  . Occupation:  retired   Tobacco Use  . Smoking status: Never Smoker  . Smokeless tobacco: Never Used  Vaping Use  . Vaping Use: Never used  Substance and Sexual Activity  . Alcohol use: No  . Drug use: No  . Sexual activity: Not Currently  Other Topics Concern  . 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   Long term resident of [facility]    Social Determinants of Health   Financial Resource Strain: Not on file  Food Insecurity: Not on file  Transportation Needs: Not on file  Physical Activity: Not on file  Stress: Not on file  Social Connections: Not on file  Intimate Partner Violence: Not on file   Family History  Problem Relation Age of Onset  . Cancer Mother   . Colon cancer Neg Hx       VITAL  SIGNS BP 125/74   Pulse 78   Temp 98.1 F (36.7 C)   Ht 5\' 8"  (1.727 m)   Wt 221 lb (100.2 kg)   SpO2 100%   BMI 33.60 kg/m   Outpatient Encounter Medications as of 11/13/2020  Medication Sig  . Amino Acids-Protein Hydrolys (FEEDING SUPPLEMENT, PRO-STAT 64,) LIQD Take 30 mLs by mouth 2 (two) times daily between meals.  Marland Kitchen atorvastatin (LIPITOR) 80 MG tablet Take 80 mg by mouth every evening.  . insulin aspart (NOVOLOG FLEXPEN) 100 UNIT/ML FlexPen Inject 20 Units into the skin daily. If cbg>150; subcutaneous  . insulin aspart (NOVOLOG) 100 UNIT/ML FlexPen Inject 15 Units into the skin 2 (two) times daily. Hold insulin on dialysis days if cbg's less than 150 on mon,wed,fri  . Insulin Glargine (BASAGLAR KWIKPEN) 100 UNIT/ML SOPN Inject 45 Units into the skin at bedtime.   Marland Kitchen JANUVIA 25 MG tablet Take 25 mg by mouth daily.   Marland Kitchen levothyroxine (SYNTHROID, LEVOTHROID) 88 MCG tablet Take 88 mcg by mouth daily before breakfast.  . midodrine (PROAMATINE) 10 MG tablet Take 10 mg by mouth. Send med with resident on Mon/Wed/Fri to dialysis. do not give [facility] give to resident to take with him. send with 5mg  to equal 15mg .  . midodrine (PROAMATINE) 5 MG tablet Take 5 mg by mouth. Send with resident on dialysis days.  Mon, Wed, Fri.  Take along with 10 mg to equal 15 mg  . NON FORMULARY 1200cc fluid restrictions. Dietary to provide 400cc/24hrs Nursing to give 7-3=140cc/24hrs 3-11=560cc/24hrs 11-7=100cc/24 Special Instructions: Document amount of fluids given each shift. Every Shift Day, Evening, Night  . NON FORMULARY Diet Type:  NAS, Cons CHO Diet  . omega-3 acid ethyl esters (LOVAZA) 1 g capsule Take 2 g by mouth at bedtime.  Marland Kitchen omeprazole (PRILOSEC) 40 MG capsule Take 40 mg by mouth daily.   . polyethylene glycol (MIRALAX / GLYCOLAX) packet Take 17 g by mouth daily as needed for mild constipation.   . sevelamer carbonate (RENVELA) 800 MG tablet Take 800 mg by mouth 3 (three) times daily with  meals. 9 am, 12 pm, and 5 pm  . tamsulosin (FLOMAX) 0.4 MG CAPS capsule Take 0.4 mg by mouth every evening. Give 30 minutes after a meal  . torsemide (DEMADEX) 10 MG tablet Take 5 tablets (50 mg total) by mouth daily.   No facility-administered encounter medications on file as of 11/13/2020.     SIGNIFICANT DIAGNOSTIC EXAMS  LABS REVIEWED PREVIOUS;  12-09-19: hgb a1c 6.9; chol 94; ldl 4 trig 339; hdl 22 tsh  3.202 04-09-20: wbc 6.2; hgb 10.7; hct 33.5; mcv 109.1 plt 144;glucose 185; bun 43; creat 10.75; k+ 4.3; na++ 137; ca 8.7 liver normal albumin 3.7 hgb a1c 7.5 07-04-20: PSA: 2.79  07-19-20: hgb a1c 7.7 09-10-20: wbc 11.8; hgb 12.9; hct 40.4; mcv 108.6 plt 163; glucose 225; bun 30;creat 8.92; k+ 3.8; na++ 138; ca 9.2 10-12-20: chol 76; ldl 64; trig 319; hdl 24; hgb a1c 7.4  10-17-20: wbc 8.2; hgb 11.0; hct 34.3; mcv 108.2 plt 121; glucose 141; bun 51; creat 13.25; k+ 3.9; na++ 138; ca 8.5 mag 2.1   NO NEW LABS.   Review of Systems  Constitutional: Negative for malaise/fatigue.  Respiratory: Negative for cough and shortness of breath.   Cardiovascular: Negative for chest pain, palpitations and leg swelling.  Gastrointestinal: Negative for abdominal pain, constipation and heartburn.  Musculoskeletal: Negative for back pain, joint pain and myalgias.  Skin: Negative.   Neurological: Negative for dizziness.  Psychiatric/Behavioral: The patient is not nervous/anxious.     Physical Exam Constitutional:      General: He is not in acute distress.    Appearance: He is well-developed and well-nourished. He is not diaphoretic.  Neck:     Thyroid: No thyromegaly.  Cardiovascular:     Rate and Rhythm: Normal rate and regular rhythm.     Pulses: Normal pulses and intact distal pulses.     Heart sounds: Normal heart sounds.  Pulmonary:     Effort: Pulmonary effort is normal. No respiratory distress.     Breath sounds: Normal breath sounds.  Abdominal:     General: Bowel sounds are normal.  There is no distension.     Palpations: Abdomen is soft.     Tenderness: There is no abdominal tenderness.  Genitourinary:    Comments: Hx turp  Musculoskeletal:        General: No edema.     Cervical back: Neck supple.     Right lower leg: No edema.     Left lower leg: No edema.     Comments:  Is able to move all extremities History of right femur ORIF                  Lymphadenopathy:     Cervical: No cervical adenopathy.  Skin:    General: Skin is warm and dry.     Comments: Right upper extremity a/v fistula + thrill /bruit   Neurological:     Mental Status: He is alert. Mental status is at baseline.  Psychiatric:        Mood and Affect: Mood and affect and mood normal.     ASSESSMENT/ PLAN:  TODAY  1. 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 Major depression single episode moderate: is presently stable is off medications will monitor   3. Type 2 diabetes mellitus with hypertension and end stage renal disease: is stable hgb a1c 7.2 will continue januvia 25 mg daily basaglar 40 units nightly novolog 10 units with breakfast and lunch 20 units with supper.    PREVIOUS   4. Bilateral lower extremity edema: is stable will continue demadex 50 mg daily   5. Orthostatic hypotension is stable b/p 125/74 will continue midodrine 15 mg with dialysis   6. Aortic atherosclerosis: (CT 2019); is stable will continue to monitor   7. GERD without esophagitis: is stable will continue prilosec 40 mg daily   8. Malignant neoplasm of colon: is status post colectomy  05-04-17 will monitor   9. Dyslipidemia associated with type 2 diabetes mellitus is stable LDL 64 trig 319; will continue lipitor 80 mg daily and lovaza 2 gm daily   10. End stage renal disease on hemodialysis due to type 2 diabetes mellitus:/hemodialysis dependent: is stable will continue dialysis three days per week; is followed by nephrology; will continue midodrine 15 mg with  dialysis  1200 cc fluid restriction renvela 1600 mg with meals and 800 mg with snacks.   11. Other specified hypothyroidism: is stable tsh 3.202 will continue synthroid 88 mcg daily    MD is aware of resident's narcotic use and is in agreement with current plan of care. We will attempt to wean resident as appropriate.  Ok Edwards NP [facility]  Contact [phone] Monday through Friday 8am- 5pm  After hours call [phone]

## 2020-11-14 DIAGNOSIS — N2581 Secondary hyperparathyroidism of renal origin: Secondary | ICD-10-CM | POA: Diagnosis not present

## 2020-11-14 DIAGNOSIS — I120 Hypertensive chronic kidney disease with stage 5 chronic kidney disease or end stage renal disease: Secondary | ICD-10-CM | POA: Diagnosis not present

## 2020-11-14 DIAGNOSIS — D631 Anemia in chronic kidney disease: Secondary | ICD-10-CM | POA: Diagnosis not present

## 2020-11-14 DIAGNOSIS — D509 Iron deficiency anemia, unspecified: Secondary | ICD-10-CM | POA: Diagnosis not present

## 2020-11-14 DIAGNOSIS — Z1159 Encounter for screening for other viral diseases: Secondary | ICD-10-CM | POA: Diagnosis not present

## 2020-11-14 DIAGNOSIS — Z992 Dependence on renal dialysis: Secondary | ICD-10-CM | POA: Diagnosis not present

## 2020-11-14 DIAGNOSIS — E1129 Type 2 diabetes mellitus with other diabetic kidney complication: Secondary | ICD-10-CM | POA: Diagnosis not present

## 2020-11-14 DIAGNOSIS — N186 End stage renal disease: Secondary | ICD-10-CM | POA: Diagnosis not present

## 2020-11-16 DIAGNOSIS — N2581 Secondary hyperparathyroidism of renal origin: Secondary | ICD-10-CM | POA: Diagnosis not present

## 2020-11-16 DIAGNOSIS — E1129 Type 2 diabetes mellitus with other diabetic kidney complication: Secondary | ICD-10-CM | POA: Diagnosis not present

## 2020-11-16 DIAGNOSIS — N186 End stage renal disease: Secondary | ICD-10-CM | POA: Diagnosis not present

## 2020-11-16 DIAGNOSIS — Z1159 Encounter for screening for other viral diseases: Secondary | ICD-10-CM | POA: Diagnosis not present

## 2020-11-16 DIAGNOSIS — I120 Hypertensive chronic kidney disease with stage 5 chronic kidney disease or end stage renal disease: Secondary | ICD-10-CM | POA: Diagnosis not present

## 2020-11-16 DIAGNOSIS — D509 Iron deficiency anemia, unspecified: Secondary | ICD-10-CM | POA: Diagnosis not present

## 2020-11-16 DIAGNOSIS — D631 Anemia in chronic kidney disease: Secondary | ICD-10-CM | POA: Diagnosis not present

## 2020-11-16 DIAGNOSIS — Z992 Dependence on renal dialysis: Secondary | ICD-10-CM | POA: Diagnosis not present

## 2020-11-19 DIAGNOSIS — E1129 Type 2 diabetes mellitus with other diabetic kidney complication: Secondary | ICD-10-CM | POA: Diagnosis not present

## 2020-11-19 DIAGNOSIS — Z1159 Encounter for screening for other viral diseases: Secondary | ICD-10-CM | POA: Diagnosis not present

## 2020-11-19 DIAGNOSIS — D631 Anemia in chronic kidney disease: Secondary | ICD-10-CM | POA: Diagnosis not present

## 2020-11-19 DIAGNOSIS — D509 Iron deficiency anemia, unspecified: Secondary | ICD-10-CM | POA: Diagnosis not present

## 2020-11-19 DIAGNOSIS — Z992 Dependence on renal dialysis: Secondary | ICD-10-CM | POA: Diagnosis not present

## 2020-11-19 DIAGNOSIS — N186 End stage renal disease: Secondary | ICD-10-CM | POA: Diagnosis not present

## 2020-11-19 DIAGNOSIS — I120 Hypertensive chronic kidney disease with stage 5 chronic kidney disease or end stage renal disease: Secondary | ICD-10-CM | POA: Diagnosis not present

## 2020-11-19 DIAGNOSIS — N2581 Secondary hyperparathyroidism of renal origin: Secondary | ICD-10-CM | POA: Diagnosis not present

## 2020-11-21 DIAGNOSIS — Z992 Dependence on renal dialysis: Secondary | ICD-10-CM | POA: Diagnosis not present

## 2020-11-21 DIAGNOSIS — D631 Anemia in chronic kidney disease: Secondary | ICD-10-CM | POA: Diagnosis not present

## 2020-11-21 DIAGNOSIS — E1129 Type 2 diabetes mellitus with other diabetic kidney complication: Secondary | ICD-10-CM | POA: Diagnosis not present

## 2020-11-21 DIAGNOSIS — I120 Hypertensive chronic kidney disease with stage 5 chronic kidney disease or end stage renal disease: Secondary | ICD-10-CM | POA: Diagnosis not present

## 2020-11-21 DIAGNOSIS — Z1159 Encounter for screening for other viral diseases: Secondary | ICD-10-CM | POA: Diagnosis not present

## 2020-11-21 DIAGNOSIS — D509 Iron deficiency anemia, unspecified: Secondary | ICD-10-CM | POA: Diagnosis not present

## 2020-11-21 DIAGNOSIS — N186 End stage renal disease: Secondary | ICD-10-CM | POA: Diagnosis not present

## 2020-11-21 DIAGNOSIS — N2581 Secondary hyperparathyroidism of renal origin: Secondary | ICD-10-CM | POA: Diagnosis not present

## 2020-11-23 DIAGNOSIS — D509 Iron deficiency anemia, unspecified: Secondary | ICD-10-CM | POA: Diagnosis not present

## 2020-11-23 DIAGNOSIS — E1129 Type 2 diabetes mellitus with other diabetic kidney complication: Secondary | ICD-10-CM | POA: Diagnosis not present

## 2020-11-23 DIAGNOSIS — N2581 Secondary hyperparathyroidism of renal origin: Secondary | ICD-10-CM | POA: Diagnosis not present

## 2020-11-23 DIAGNOSIS — Z992 Dependence on renal dialysis: Secondary | ICD-10-CM | POA: Diagnosis not present

## 2020-11-23 DIAGNOSIS — N186 End stage renal disease: Secondary | ICD-10-CM | POA: Diagnosis not present

## 2020-11-23 DIAGNOSIS — I120 Hypertensive chronic kidney disease with stage 5 chronic kidney disease or end stage renal disease: Secondary | ICD-10-CM | POA: Diagnosis not present

## 2020-11-23 DIAGNOSIS — Z1159 Encounter for screening for other viral diseases: Secondary | ICD-10-CM | POA: Diagnosis not present

## 2020-11-23 DIAGNOSIS — D631 Anemia in chronic kidney disease: Secondary | ICD-10-CM | POA: Diagnosis not present

## 2020-11-26 DIAGNOSIS — D631 Anemia in chronic kidney disease: Secondary | ICD-10-CM | POA: Diagnosis not present

## 2020-11-26 DIAGNOSIS — I120 Hypertensive chronic kidney disease with stage 5 chronic kidney disease or end stage renal disease: Secondary | ICD-10-CM | POA: Diagnosis not present

## 2020-11-26 DIAGNOSIS — Z1159 Encounter for screening for other viral diseases: Secondary | ICD-10-CM | POA: Diagnosis not present

## 2020-11-26 DIAGNOSIS — N2581 Secondary hyperparathyroidism of renal origin: Secondary | ICD-10-CM | POA: Diagnosis not present

## 2020-11-26 DIAGNOSIS — Z992 Dependence on renal dialysis: Secondary | ICD-10-CM | POA: Diagnosis not present

## 2020-11-26 DIAGNOSIS — N186 End stage renal disease: Secondary | ICD-10-CM | POA: Diagnosis not present

## 2020-11-26 DIAGNOSIS — D509 Iron deficiency anemia, unspecified: Secondary | ICD-10-CM | POA: Diagnosis not present

## 2020-11-26 DIAGNOSIS — E1129 Type 2 diabetes mellitus with other diabetic kidney complication: Secondary | ICD-10-CM | POA: Diagnosis not present

## 2020-11-28 DIAGNOSIS — D509 Iron deficiency anemia, unspecified: Secondary | ICD-10-CM | POA: Diagnosis not present

## 2020-11-28 DIAGNOSIS — N186 End stage renal disease: Secondary | ICD-10-CM | POA: Diagnosis not present

## 2020-11-28 DIAGNOSIS — D631 Anemia in chronic kidney disease: Secondary | ICD-10-CM | POA: Diagnosis not present

## 2020-11-28 DIAGNOSIS — I120 Hypertensive chronic kidney disease with stage 5 chronic kidney disease or end stage renal disease: Secondary | ICD-10-CM | POA: Diagnosis not present

## 2020-11-28 DIAGNOSIS — N2581 Secondary hyperparathyroidism of renal origin: Secondary | ICD-10-CM | POA: Diagnosis not present

## 2020-11-28 DIAGNOSIS — Z992 Dependence on renal dialysis: Secondary | ICD-10-CM | POA: Diagnosis not present

## 2020-11-28 DIAGNOSIS — Z1159 Encounter for screening for other viral diseases: Secondary | ICD-10-CM | POA: Diagnosis not present

## 2020-11-28 DIAGNOSIS — E1129 Type 2 diabetes mellitus with other diabetic kidney complication: Secondary | ICD-10-CM | POA: Diagnosis not present

## 2020-11-30 DIAGNOSIS — D631 Anemia in chronic kidney disease: Secondary | ICD-10-CM | POA: Diagnosis not present

## 2020-11-30 DIAGNOSIS — I120 Hypertensive chronic kidney disease with stage 5 chronic kidney disease or end stage renal disease: Secondary | ICD-10-CM | POA: Diagnosis not present

## 2020-11-30 DIAGNOSIS — D509 Iron deficiency anemia, unspecified: Secondary | ICD-10-CM | POA: Diagnosis not present

## 2020-11-30 DIAGNOSIS — Z1159 Encounter for screening for other viral diseases: Secondary | ICD-10-CM | POA: Diagnosis not present

## 2020-11-30 DIAGNOSIS — N186 End stage renal disease: Secondary | ICD-10-CM | POA: Diagnosis not present

## 2020-11-30 DIAGNOSIS — N2581 Secondary hyperparathyroidism of renal origin: Secondary | ICD-10-CM | POA: Diagnosis not present

## 2020-11-30 DIAGNOSIS — E1129 Type 2 diabetes mellitus with other diabetic kidney complication: Secondary | ICD-10-CM | POA: Diagnosis not present

## 2020-11-30 DIAGNOSIS — Z992 Dependence on renal dialysis: Secondary | ICD-10-CM | POA: Diagnosis not present

## 2020-12-03 DIAGNOSIS — D509 Iron deficiency anemia, unspecified: Secondary | ICD-10-CM | POA: Diagnosis not present

## 2020-12-03 DIAGNOSIS — I120 Hypertensive chronic kidney disease with stage 5 chronic kidney disease or end stage renal disease: Secondary | ICD-10-CM | POA: Diagnosis not present

## 2020-12-03 DIAGNOSIS — Z1159 Encounter for screening for other viral diseases: Secondary | ICD-10-CM | POA: Diagnosis not present

## 2020-12-03 DIAGNOSIS — D631 Anemia in chronic kidney disease: Secondary | ICD-10-CM | POA: Diagnosis not present

## 2020-12-03 DIAGNOSIS — E1129 Type 2 diabetes mellitus with other diabetic kidney complication: Secondary | ICD-10-CM | POA: Diagnosis not present

## 2020-12-03 DIAGNOSIS — N2581 Secondary hyperparathyroidism of renal origin: Secondary | ICD-10-CM | POA: Diagnosis not present

## 2020-12-03 DIAGNOSIS — Z992 Dependence on renal dialysis: Secondary | ICD-10-CM | POA: Diagnosis not present

## 2020-12-03 DIAGNOSIS — N186 End stage renal disease: Secondary | ICD-10-CM | POA: Diagnosis not present

## 2020-12-05 DIAGNOSIS — E1129 Type 2 diabetes mellitus with other diabetic kidney complication: Secondary | ICD-10-CM | POA: Diagnosis not present

## 2020-12-05 DIAGNOSIS — Z992 Dependence on renal dialysis: Secondary | ICD-10-CM | POA: Diagnosis not present

## 2020-12-05 DIAGNOSIS — D509 Iron deficiency anemia, unspecified: Secondary | ICD-10-CM | POA: Diagnosis not present

## 2020-12-05 DIAGNOSIS — N186 End stage renal disease: Secondary | ICD-10-CM | POA: Diagnosis not present

## 2020-12-05 DIAGNOSIS — D631 Anemia in chronic kidney disease: Secondary | ICD-10-CM | POA: Diagnosis not present

## 2020-12-05 DIAGNOSIS — I120 Hypertensive chronic kidney disease with stage 5 chronic kidney disease or end stage renal disease: Secondary | ICD-10-CM | POA: Diagnosis not present

## 2020-12-05 DIAGNOSIS — Z1159 Encounter for screening for other viral diseases: Secondary | ICD-10-CM | POA: Diagnosis not present

## 2020-12-05 DIAGNOSIS — N2581 Secondary hyperparathyroidism of renal origin: Secondary | ICD-10-CM | POA: Diagnosis not present

## 2020-12-07 DIAGNOSIS — N2581 Secondary hyperparathyroidism of renal origin: Secondary | ICD-10-CM | POA: Diagnosis not present

## 2020-12-07 DIAGNOSIS — D631 Anemia in chronic kidney disease: Secondary | ICD-10-CM | POA: Diagnosis not present

## 2020-12-07 DIAGNOSIS — I120 Hypertensive chronic kidney disease with stage 5 chronic kidney disease or end stage renal disease: Secondary | ICD-10-CM | POA: Diagnosis not present

## 2020-12-07 DIAGNOSIS — Z1159 Encounter for screening for other viral diseases: Secondary | ICD-10-CM | POA: Diagnosis not present

## 2020-12-07 DIAGNOSIS — N186 End stage renal disease: Secondary | ICD-10-CM | POA: Diagnosis not present

## 2020-12-07 DIAGNOSIS — D509 Iron deficiency anemia, unspecified: Secondary | ICD-10-CM | POA: Diagnosis not present

## 2020-12-07 DIAGNOSIS — Z992 Dependence on renal dialysis: Secondary | ICD-10-CM | POA: Diagnosis not present

## 2020-12-07 DIAGNOSIS — E1129 Type 2 diabetes mellitus with other diabetic kidney complication: Secondary | ICD-10-CM | POA: Diagnosis not present

## 2020-12-10 DIAGNOSIS — D509 Iron deficiency anemia, unspecified: Secondary | ICD-10-CM | POA: Diagnosis not present

## 2020-12-10 DIAGNOSIS — Z992 Dependence on renal dialysis: Secondary | ICD-10-CM | POA: Diagnosis not present

## 2020-12-10 DIAGNOSIS — Z1159 Encounter for screening for other viral diseases: Secondary | ICD-10-CM | POA: Diagnosis not present

## 2020-12-10 DIAGNOSIS — E1129 Type 2 diabetes mellitus with other diabetic kidney complication: Secondary | ICD-10-CM | POA: Diagnosis not present

## 2020-12-10 DIAGNOSIS — N2581 Secondary hyperparathyroidism of renal origin: Secondary | ICD-10-CM | POA: Diagnosis not present

## 2020-12-10 DIAGNOSIS — D631 Anemia in chronic kidney disease: Secondary | ICD-10-CM | POA: Diagnosis not present

## 2020-12-10 DIAGNOSIS — N186 End stage renal disease: Secondary | ICD-10-CM | POA: Diagnosis not present

## 2020-12-10 DIAGNOSIS — I120 Hypertensive chronic kidney disease with stage 5 chronic kidney disease or end stage renal disease: Secondary | ICD-10-CM | POA: Diagnosis not present

## 2020-12-12 ENCOUNTER — Non-Acute Institutional Stay (SKILLED_NURSING_FACILITY): Payer: Medicare Other | Admitting: Adult Health

## 2020-12-12 ENCOUNTER — Encounter: Payer: Self-pay | Admitting: Adult Health

## 2020-12-12 DIAGNOSIS — Z992 Dependence on renal dialysis: Secondary | ICD-10-CM | POA: Diagnosis not present

## 2020-12-12 DIAGNOSIS — Z1159 Encounter for screening for other viral diseases: Secondary | ICD-10-CM | POA: Diagnosis not present

## 2020-12-12 DIAGNOSIS — D631 Anemia in chronic kidney disease: Secondary | ICD-10-CM | POA: Diagnosis not present

## 2020-12-12 DIAGNOSIS — N2581 Secondary hyperparathyroidism of renal origin: Secondary | ICD-10-CM | POA: Diagnosis not present

## 2020-12-12 DIAGNOSIS — N186 End stage renal disease: Secondary | ICD-10-CM | POA: Diagnosis not present

## 2020-12-12 DIAGNOSIS — I951 Orthostatic hypotension: Secondary | ICD-10-CM

## 2020-12-12 DIAGNOSIS — R600 Localized edema: Secondary | ICD-10-CM | POA: Diagnosis not present

## 2020-12-12 DIAGNOSIS — E1129 Type 2 diabetes mellitus with other diabetic kidney complication: Secondary | ICD-10-CM | POA: Diagnosis not present

## 2020-12-12 DIAGNOSIS — I700 Atherosclerosis of aorta: Secondary | ICD-10-CM | POA: Diagnosis not present

## 2020-12-12 DIAGNOSIS — D509 Iron deficiency anemia, unspecified: Secondary | ICD-10-CM | POA: Diagnosis not present

## 2020-12-12 DIAGNOSIS — I120 Hypertensive chronic kidney disease with stage 5 chronic kidney disease or end stage renal disease: Secondary | ICD-10-CM | POA: Diagnosis not present

## 2020-12-12 NOTE — Progress Notes (Signed)
Location:  [facility] Room Number: 112/D Place of Service:  SNF (31)   CODE STATUS: Full Code  Allergies  Allergen Reactions  . No Known Allergies     Chief Complaint  Patient presents with  . Medical Management of Chronic Issues           Bilateral lower extremity edema:  Orthostatic hypotension  Aortic atherosclerosis     HPI:  He is a 75 year old long term resident of this facility being seen for the management of his chronic illnesses: Bilateral lower extremity edema:  Orthostatic hypotension  Aortic atherosclerosis. There are no reports of uncontrolled pain; changes in appetite; no dizziness. No insomnia.   Past Medical History:  Diagnosis Date  . Abnormal CT scan, kidney 10/06/2011  . Acute pyelonephritis 10/07/2011  . Anemia    normocytic  . Anxiety    mental retardation  . Bladder wall thickening 10/06/2011  . BPH (benign prostatic hypertrophy)   . Diabetes mellitus   . Dialysis patient [facility])    Tuesday, Thursday and Saturday,   . DVT of leg (deep venous thrombosis) ([place]) 12/25/2016  . Edema     history of lower extremity edema  . GERD (gastroesophageal reflux disease)   . Heme positive stool   . Hydronephrosis   . Hyperkalemia   . Hyperlipidemia   . Hypernatremia   . Hypertension   . Hypothyroidism   . Impaired speech   . 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 MR (mental retardation)   . Muscle weakness   . Obstructive uropathy   . 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 Poor historian poor historian  . Protein calorie malnutrition ([place])   . Pyelonephritis   . Renal failure (ARF), acute on chronic (HCC)   . Renal insufficiency    chronic history  . Sepsis ([place])   . Smoking   . Uremia   . Urinary retention   . UTI (lower urinary tract infection) 10/06/2011    Past Surgical History:  Procedure Laterality Date  . A/V FISTULAGRAM N/A 08/13/2018   Procedure: A/V FISTULAGRAM - Right Upper;  Surgeon: Elam Dutch, MD;   Location: [place] [facility];  Service: Cardiovascular;  Laterality: N/A;  . A/V FISTULAGRAM N/A 11/22/2018   Procedure: A/V FISTULAGRAM - Right Upper;  Surgeon: Waynetta Sandy, MD;  Location: [place] [facility];  Service: Cardiovascular;  Laterality: N/A;  . AV FISTULA PLACEMENT Left 07/06/2015   Procedure:  INSERTION LEFT ARM ARTERIOVENOUS GORTEX GRAFT;  Surgeon: Angelia Mould, MD;  Location: [place];  Service: Vascular;  Laterality: Left;  . AV FISTULA PLACEMENT Right 02/26/2016   Procedure: ARTERIOVENOUS (AV) FISTULA CREATION ;  Surgeon: Angelia Mould, MD;  Location: [place];  Service: Vascular;  Laterality: Right;  . AV FISTULA PLACEMENT Right 11/25/2018   Procedure: INSERTION OF ARTERIOVENOUS (AV) ARTEGRAFT RIGHT UPPER ARM;  Surgeon: Waynetta Sandy, MD;  Location: [place];  Service: Vascular;  Laterality: Right;  . [place] REMOVAL Left 10/09/2015   Procedure: REMOVAL OF ARTERIOVENOUS GORETEX GRAFT ([place]) Evacuation of Lymphocele, Vein Patch angioplasty of brachial artery.;  Surgeon: Angelia Mould, MD;  Location: [place];  Service: Vascular;  Laterality: Left;  . BASCILIC VEIN TRANSPOSITION Right 02/26/2016   Procedure: Right BASCILIC VEIN TRANSPOSITION;  Surgeon: Angelia Mould, MD;  Location: [place];  Service: Vascular;  Laterality: Right;  . CIRCUMCISION N/A 01/04/2014   Procedure: CIRCUMCISION ADULT (procedure #1);  Surgeon: Silvano Rusk  Michela Pitcher, MD;  Location: [facility];  Service: Urology;  Laterality: N/A;  . COLECTOMY N/A 05/04/2017   Procedure: TOTAL COLECTOMY;  Surgeon: Aviva Signs, MD;  Location: [facility];  Service: General;  Laterality: N/A;  . COLONOSCOPY N/A 04/27/2017   Procedure: COLONOSCOPY;  Surgeon: Daneil Dolin, MD;  [facility];  Service: Endoscopy;  Laterality: N/A;  245  . CYSTOSCOPY W/ RETROGRADES Bilateral 06/29/2015   Procedure: CYSTOSCOPY, DILATION OF URETHRAL STRICTURE WITH BILATERAL RETROGRADE PYELOGRAM,SUPRAPUBIC TUBE CHANGE;   Surgeon: Festus Aloe, MD;  Location: [facility];  Service: Urology;  Laterality: Bilateral;  . CYSTOSCOPY WITH URETHRAL DILATATION N/A 12/29/2013   Procedure: CYSTOSCOPY WITH URETHRAL DILATATION;  Surgeon: Marissa Nestle, MD;  [facility];  Service: Urology;  Laterality: N/A;  . ESOPHAGOGASTRODUODENOSCOPY N/A 04/27/2017   Procedure: ESOPHAGOGASTRODUODENOSCOPY (EGD);  Surgeon: Daneil Dolin, MD;  [facility];  Service: Endoscopy;  Laterality: N/A;  . INSERTION OF DIALYSIS CATHETER Right 11/25/2018   Procedure: INSERTION OF DIALYSIS CATHETER RIGHT INTERNAL JUGULAR;  Surgeon: Waynetta Sandy, MD;  Location: [place];  Service: Vascular;  Laterality: Right;  . IR AV DIALY SHUNT INTRO [place] W/PTA/IMG RIGHT Right 09/07/2018  . IR FLUORO GUIDE CV LINE RIGHT  10/16/2020  . IR REMOVAL TUN CV CATH W/O FL  01/12/2019  . IR THROMBECTOMY AV FISTULA W/THROMBOLYSIS/PTA INC/SHUNT/IMG RIGHT Right 04/26/2018  . IR US GUIDE VASC ACCESS RIGHT  04/26/2018  . IR US GUIDE VASC ACCESS RIGHT  09/07/2018  . IR US GUIDE VASC ACCESS RIGHT  10/16/2020  . IR US GUIDE VASC ACCESS RIGHT  10/16/2020  . ORIF FEMUR FRACTURE Right 11/22/2016   Procedure: OPEN REDUCTION INTERNAL FIXATION (ORIF) DISTAL FEMUR FRACTURE;  Surgeon: Rod Can, MD;  Location: [place];  Service: Orthopedics;  Laterality: Right;  . PATCH ANGIOPLASTY Right 12/10/2017   Procedure: PATCH ANGIOPLASTY;  Surgeon: Angelia Mould, MD;  Location: [facility];  Service: Vascular;  Laterality: Right;  . PERIPHERAL VASCULAR BALLOON ANGIOPLASTY  08/13/2018   Procedure: PERIPHERAL VASCULAR BALLOON ANGIOPLASTY;  Surgeon: Elam Dutch, MD;  Location: [place] [facility];  Service: Cardiovascular;;  right AV fistula   . PERIPHERAL VASCULAR BALLOON ANGIOPLASTY  11/22/2018   Procedure: PERIPHERAL VASCULAR BALLOON ANGIOPLASTY;  Surgeon: Waynetta Sandy, MD;  Location: [place] [facility];  Service: Cardiovascular;;  rt AV  fistula  . PERIPHERAL VASCULAR CATHETERIZATION N/A 10/08/2015   Procedure: A/V Shuntogram;  Surgeon: Angelia Mould, MD;  Location: [place] [facility];  Service: Cardiovascular;  Laterality: N/A;  . THROMBECTOMY W/ EMBOLECTOMY Right 12/10/2017   Procedure: THROMBECTOMY REVISION RIGHT ARM  ARTERIOVENOUS FISTULA;  Surgeon: Angelia Mould, MD;  Location: [place];  Service: Vascular;  Laterality: Right;  . TRANSURETHRAL RESECTION OF PROSTATE N/A 01/04/2014   Procedure: TRANSURETHRAL RESECTION OF THE PROSTATE (TURP) (procedure #2);  Surgeon: Marissa Nestle, MD;  [facility];  Service: Urology;  Laterality: N/A;    Social History   Socioeconomic History  . Marital status: Single    Spouse name: Not on file  . Number of children: Not on file  . Years of education: Not on file  . Highest education level: Not on file  Occupational History  . Occupation: retired   Tobacco Use  . Smoking status: Never Smoker  . Smokeless tobacco: Never Used  Vaping Use  . Vaping Use: Never used  Substance and Sexual Activity  . Alcohol use: No  . Drug use: No  .  Sexual activity: Not Currently  Other Topics Concern  . 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   [facility]    Social Determinants of Health   Financial Resource Strain: Not on file  Food Insecurity: Not on file  Transportation Needs: Not on file  Physical Activity: Not on file  Stress: Not on file  Social Connections: Not on file  Intimate Partner Violence: Not on file   Family History  Problem Relation Age of Onset  . Cancer Mother   . Colon cancer Neg Hx       VITAL SIGNS BP (!) 98/57   Pulse 92   Temp 97.6 F (36.4 C)   Resp 20   Ht 5\' 8"  (1.727 m)   Wt 218 lb (98.9 kg)   BMI 33.15 kg/m   Outpatient Encounter Medications as of 12/12/2020  Medication Sig  . Amino Acids-Protein Hydrolys (FEEDING SUPPLEMENT,  PRO-STAT 64,) LIQD Take 30 mLs by mouth 2 (two) times daily between meals.  Marland Kitchen atorvastatin (LIPITOR) 80 MG tablet Take 80 mg by mouth every evening.  . insulin aspart (NOVOLOG FLEXPEN) 100 UNIT/ML FlexPen Inject 20 Units into the skin daily. If cbg>150; subcutaneous  . insulin aspart (NOVOLOG) 100 UNIT/ML FlexPen Inject 15 Units into the skin 2 (two) times daily. Hold insulin on dialysis days if cbg's less than 150 on mon,wed,fri  . Insulin Glargine (BASAGLAR KWIKPEN) 100 UNIT/ML SOPN Inject 45 Units into the skin at bedtime.   Marland Kitchen JANUVIA 25 MG tablet Take 25 mg by mouth daily.   Marland Kitchen levothyroxine (SYNTHROID, LEVOTHROID) 88 MCG tablet Take 88 mcg by mouth daily before breakfast.  . midodrine (PROAMATINE) 10 MG tablet Take 10 mg by mouth. Send med with resident on Mon/Wed/Fri to dialysis. do not give at [facility] give to resident to take with him. send with 5mg  to equal 15mg .  . midodrine (PROAMATINE) 5 MG tablet Take 5 mg by mouth. Send with resident on dialysis days.  Mon, Wed, Fri.  Take along with 10 mg to equal 15 mg  . NON FORMULARY 1200cc fluid restrictions. Dietary to provide 400cc/24hrs Nursing to give 7-3=140cc/24hrs 3-11=560cc/24hrs 11-7=100cc/24 Special Instructions: Document amount of fluids given each shift. Every Shift Day, Evening, Night  . NON FORMULARY Diet Type:  NAS, Cons CHO Diet  . omega-3 acid ethyl esters (LOVAZA) 1 g capsule Take 2 g by mouth at bedtime.  Marland Kitchen omeprazole (PRILOSEC) 40 MG capsule Take 40 mg by mouth daily.   . polyethylene glycol (MIRALAX / GLYCOLAX) packet Take 17 g by mouth daily as needed for mild constipation.   . sevelamer carbonate (RENVELA) 800 MG tablet Take 2,400 mg by mouth 3 (three) times daily with meals.  . tamsulosin (FLOMAX) 0.4 MG CAPS capsule Take 0.4 mg by mouth every evening. Give 30 minutes after a meal  . torsemide (DEMADEX) 10 MG tablet Take 5 tablets (50 mg total) by mouth daily.   No facility-administered encounter medications on file  as of 12/12/2020.     SIGNIFICANT DIAGNOSTIC EXAMS   LABS REVIEWED PREVIOUS;  12-09-19: hgb a1c 6.9; chol 94; ldl 4 trig 339; hdl 22 tsh 3.202 04-09-20: wbc 6.2; hgb 10.7; hct 33.5; mcv 109.1 plt 144;glucose 185; bun 43; creat 10.75; k+ 4.3; na++ 137; ca 8.7 liver normal albumin 3.7 hgb a1c 7.5 07-04-20: PSA: 2.79  07-19-20: hgb a1c 7.7 09-10-20: wbc 11.8; hgb 12.9; hct 40.4; mcv 108.6 plt  163; glucose 225; bun 30;creat 8.92; k+ 3.8; na++ 138; ca 9.2 10-12-20: chol 76; ldl 64; trig 319; hdl 24; hgb a1c 7.4  10-17-20: wbc 8.2; hgb 11.0; hct 34.3; mcv 108.2 plt 121; glucose 141; bun 51; creat 13.25; k+ 3.9; na++ 138; ca 8.5 mag 2.1   NO NEW LABS.   Review of Systems  Constitutional: Negative for malaise/fatigue.  Respiratory: Negative for cough and shortness of breath.   Cardiovascular: Negative for chest pain, palpitations and leg swelling.  Gastrointestinal: Negative for abdominal pain, constipation and heartburn.  Musculoskeletal: Negative for back pain, joint pain and myalgias.  Skin: Negative.   Neurological: Negative for dizziness.  Psychiatric/Behavioral: The patient is not nervous/anxious.       Physical Exam Constitutional:      General: He is not in acute distress.    Appearance: He is well-developed and well-nourished. He is obese. He is not diaphoretic.  Neck:     Thyroid: No thyromegaly.  Cardiovascular:     Rate and Rhythm: Normal rate and regular rhythm.     Pulses: Normal pulses and intact distal pulses.     Heart sounds: Normal heart sounds.  Pulmonary:     Effort: Pulmonary effort is normal. No respiratory distress.     Breath sounds: Normal breath sounds.  Abdominal:     General: Bowel sounds are normal. There is no distension.     Palpations: Abdomen is soft.     Tenderness: There is no abdominal tenderness.  Genitourinary:    Comments: Hx turp Musculoskeletal:        General: No edema.     Cervical back: Neck supple.     Right lower leg: No edema.      Left lower leg: No edema.     Comments: Is able to move all extremities History of right femur ORIF                   Lymphadenopathy:     Cervical: No cervical adenopathy.  Skin:    General: Skin is warm and dry.     Comments: Right upper extremity a/v fistula + thrill /bruit    Neurological:     Mental Status: He is alert. Mental status is at baseline.  Psychiatric:        Mood and Affect: Mood and affect and mood normal.     ASSESSMENT/ PLAN:  TODAY  1. Bilateral lower extremity edema: is stable will continue demedex 50 mg daily   2. Orthostatic hypotension stable b/p 98/57 will continue midodrine 15 mg with dialysis   3. Aortic atherosclerosis (ct 2019) is stable will monitor    PREVIOUS   4. GERD without esophagitis: is stable will continue prilosec 40 mg daily   5. Malignant neoplasm of colon: is status post colectomy 05-04-17 will monitor   6. Dyslipidemia associated with type 2 diabetes mellitus is stable LDL 64 trig 319; will continue lipitor 80 mg daily and lovaza 2 gm daily   7. End stage renal disease on hemodialysis due to type 2 diabetes mellitus:/hemodialysis dependent: is stable will continue dialysis three days per week; is followed by nephrology; will continue midodrine 15 mg with dialysis  1200 cc fluid restriction renvela 1600 mg with meals and 800 mg with snacks.   8. Other specified hypothyroidism: is stable tsh 3.202 will continue synthroid 88 mcg daily   9. 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 Major depression  single episode moderate: is presently stable is off medications will monitor   11. Type 2 diabetes mellitus with hypertension and end stage renal disease: is stable hgb a1c 7.2 will continue januvia 25 mg daily basaglar 40 units nightly novolog 10 units with breakfast and lunch 20 units with supper.         MD is aware of resident's narcotic use and is in agreement with current plan of  care. We will attempt to wean resident as appropriate.  Ok Edwards NP [facility]  Contact [phone] Monday through Friday 8am- 5pm  After hours call [phone]

## 2020-12-14 DIAGNOSIS — N186 End stage renal disease: Secondary | ICD-10-CM | POA: Diagnosis not present

## 2020-12-14 DIAGNOSIS — F4322 Adjustment disorder with anxiety: Secondary | ICD-10-CM | POA: Diagnosis not present

## 2020-12-14 DIAGNOSIS — D631 Anemia in chronic kidney disease: Secondary | ICD-10-CM | POA: Diagnosis not present

## 2020-12-14 DIAGNOSIS — N2581 Secondary hyperparathyroidism of renal origin: Secondary | ICD-10-CM | POA: Diagnosis not present

## 2020-12-14 DIAGNOSIS — D509 Iron deficiency anemia, unspecified: Secondary | ICD-10-CM | POA: Diagnosis not present

## 2020-12-14 DIAGNOSIS — F70 Mild intellectual disabilities: Secondary | ICD-10-CM | POA: Diagnosis not present

## 2020-12-14 DIAGNOSIS — Z992 Dependence on renal dialysis: Secondary | ICD-10-CM | POA: Diagnosis not present

## 2020-12-17 DIAGNOSIS — N186 End stage renal disease: Secondary | ICD-10-CM | POA: Diagnosis not present

## 2020-12-17 DIAGNOSIS — D509 Iron deficiency anemia, unspecified: Secondary | ICD-10-CM | POA: Diagnosis not present

## 2020-12-17 DIAGNOSIS — Z992 Dependence on renal dialysis: Secondary | ICD-10-CM | POA: Diagnosis not present

## 2020-12-17 DIAGNOSIS — N2581 Secondary hyperparathyroidism of renal origin: Secondary | ICD-10-CM | POA: Diagnosis not present

## 2020-12-17 DIAGNOSIS — D631 Anemia in chronic kidney disease: Secondary | ICD-10-CM | POA: Diagnosis not present

## 2020-12-19 DIAGNOSIS — N2581 Secondary hyperparathyroidism of renal origin: Secondary | ICD-10-CM | POA: Diagnosis not present

## 2020-12-19 DIAGNOSIS — Z992 Dependence on renal dialysis: Secondary | ICD-10-CM | POA: Diagnosis not present

## 2020-12-19 DIAGNOSIS — D509 Iron deficiency anemia, unspecified: Secondary | ICD-10-CM | POA: Diagnosis not present

## 2020-12-19 DIAGNOSIS — D631 Anemia in chronic kidney disease: Secondary | ICD-10-CM | POA: Diagnosis not present

## 2020-12-19 DIAGNOSIS — N186 End stage renal disease: Secondary | ICD-10-CM | POA: Diagnosis not present

## 2020-12-21 DIAGNOSIS — N186 End stage renal disease: Secondary | ICD-10-CM | POA: Diagnosis not present

## 2020-12-21 DIAGNOSIS — N2581 Secondary hyperparathyroidism of renal origin: Secondary | ICD-10-CM | POA: Diagnosis not present

## 2020-12-21 DIAGNOSIS — D509 Iron deficiency anemia, unspecified: Secondary | ICD-10-CM | POA: Diagnosis not present

## 2020-12-21 DIAGNOSIS — Z992 Dependence on renal dialysis: Secondary | ICD-10-CM | POA: Diagnosis not present

## 2020-12-21 DIAGNOSIS — D631 Anemia in chronic kidney disease: Secondary | ICD-10-CM | POA: Diagnosis not present

## 2020-12-24 DIAGNOSIS — N2581 Secondary hyperparathyroidism of renal origin: Secondary | ICD-10-CM | POA: Diagnosis not present

## 2020-12-24 DIAGNOSIS — D509 Iron deficiency anemia, unspecified: Secondary | ICD-10-CM | POA: Diagnosis not present

## 2020-12-24 DIAGNOSIS — D631 Anemia in chronic kidney disease: Secondary | ICD-10-CM | POA: Diagnosis not present

## 2020-12-24 DIAGNOSIS — N186 End stage renal disease: Secondary | ICD-10-CM | POA: Diagnosis not present

## 2020-12-24 DIAGNOSIS — Z992 Dependence on renal dialysis: Secondary | ICD-10-CM | POA: Diagnosis not present

## 2020-12-26 DIAGNOSIS — E113293 Type 2 diabetes mellitus with mild nonproliferative diabetic retinopathy without macular edema, bilateral: Secondary | ICD-10-CM | POA: Diagnosis not present

## 2020-12-26 DIAGNOSIS — Z992 Dependence on renal dialysis: Secondary | ICD-10-CM | POA: Diagnosis not present

## 2020-12-26 DIAGNOSIS — N2581 Secondary hyperparathyroidism of renal origin: Secondary | ICD-10-CM | POA: Diagnosis not present

## 2020-12-26 DIAGNOSIS — N186 End stage renal disease: Secondary | ICD-10-CM | POA: Diagnosis not present

## 2020-12-26 DIAGNOSIS — H524 Presbyopia: Secondary | ICD-10-CM | POA: Diagnosis not present

## 2020-12-26 DIAGNOSIS — D509 Iron deficiency anemia, unspecified: Secondary | ICD-10-CM | POA: Diagnosis not present

## 2020-12-26 DIAGNOSIS — H401134 Primary open-angle glaucoma, bilateral, indeterminate stage: Secondary | ICD-10-CM | POA: Diagnosis not present

## 2020-12-26 DIAGNOSIS — H2513 Age-related nuclear cataract, bilateral: Secondary | ICD-10-CM | POA: Diagnosis not present

## 2020-12-26 DIAGNOSIS — D631 Anemia in chronic kidney disease: Secondary | ICD-10-CM | POA: Diagnosis not present

## 2020-12-26 LAB — HM DIABETES EYE EXAM

## 2020-12-27 ENCOUNTER — Non-Acute Institutional Stay (SKILLED_NURSING_FACILITY): Payer: Medicare Other | Admitting: Adult Health

## 2020-12-27 ENCOUNTER — Encounter: Payer: Self-pay | Admitting: Adult Health

## 2020-12-27 DIAGNOSIS — Z992 Dependence on renal dialysis: Secondary | ICD-10-CM

## 2020-12-27 DIAGNOSIS — N186 End stage renal disease: Secondary | ICD-10-CM | POA: Diagnosis not present

## 2020-12-27 DIAGNOSIS — E1122 Type 2 diabetes mellitus with diabetic chronic kidney disease: Secondary | ICD-10-CM

## 2020-12-27 DIAGNOSIS — I700 Atherosclerosis of aorta: Secondary | ICD-10-CM | POA: Diagnosis not present

## 2020-12-27 NOTE — Progress Notes (Signed)
Location:  [place] Room Number: 112/D Place of Service:  SNF (31)   CODE STATUS: Full Code  Allergies  Allergen Reactions  . No Known Allergies     Chief Complaint  Patient presents with  . Acute Visit    Care Plan Meeting     HPI:  We have come together for his care plan meeting. BIMS 8/15 mood 0/30. he is independent to supervision with his adls. He able to feed himself. He is occasionally incontinent of urine; is continent of bowel. His cbg's are are stable at 109-163. He remains on a 1200 cc fluid restriction (nonadnerent) remains on dialysis three days per week. Weight 218 pounds within his range.    He will continue to be followed for his chronic illnesses including: Aortic atherosclerosis Dependence on renal dialysis   End stage renal disease due to type 2 diabetes mellitus   Past Medical History:  Diagnosis Date  . Abnormal CT scan, kidney 10/06/2011  . Acute pyelonephritis 10/07/2011  . Anemia    normocytic  . Anxiety    mental retardation  . Bladder wall thickening 10/06/2011  . BPH (benign prostatic hypertrophy)   . Diabetes mellitus   . Dialysis patient [facility])    Tuesday, Thursday and Saturday,   . DVT of leg (deep venous thrombosis) ([place]) 12/25/2016  . Edema     history of lower extremity edema  . GERD (gastroesophageal reflux disease)   . Heme positive stool   . Hydronephrosis   . Hyperkalemia   . Hyperlipidemia   . Hypernatremia   . Hypertension   . Hypothyroidism   . Impaired speech   . 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 MR (mental retardation)   . Muscle weakness   . Obstructive uropathy   . 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 Poor historian poor historian  . Protein calorie malnutrition ([place])   . Pyelonephritis   . Renal failure (ARF), acute on chronic (HCC)   . Renal insufficiency    chronic history  . Sepsis ([place])   . Smoking   . Uremia   . Urinary retention   . UTI (lower urinary tract infection)  10/06/2011    Past Surgical History:  Procedure Laterality Date  . A/V FISTULAGRAM N/A 08/13/2018   Procedure: A/V FISTULAGRAM - Right Upper;  Surgeon: Elam Dutch, MD;  Location: [place] CV LAB;  Service: Cardiovascular;  Laterality: N/A;  . A/V FISTULAGRAM N/A 11/22/2018   Procedure: A/V FISTULAGRAM - Right Upper;  Surgeon: Waynetta Sandy, MD;  Location: [place] CV LAB;  Service: Cardiovascular;  Laterality: N/A;  . AV FISTULA PLACEMENT Left 07/06/2015   Procedure:  INSERTION LEFT ARM ARTERIOVENOUS GORTEX GRAFT;  Surgeon: Angelia Mould, MD;  Location: [place];  Service: Vascular;  Laterality: Left;  . AV FISTULA PLACEMENT Right 02/26/2016   Procedure: ARTERIOVENOUS (AV) FISTULA CREATION ;  Surgeon: Angelia Mould, MD;  Location: [place];  Service: Vascular;  Laterality: Right;  . AV FISTULA PLACEMENT Right 11/25/2018   Procedure: INSERTION OF ARTERIOVENOUS (AV) ARTEGRAFT RIGHT UPPER ARM;  Surgeon: Waynetta Sandy, MD;  Location: [place];  Service: Vascular;  Laterality: Right;  . [place] REMOVAL Left 10/09/2015   Procedure: REMOVAL OF ARTERIOVENOUS GORETEX GRAFT ([place]) Evacuation of Lymphocele, Vein Patch angioplasty of brachial artery.;  Surgeon: Angelia Mould, MD;  Location: [place];  Service: Vascular;  Laterality: Left;  . [place] Right 02/26/2016   Procedure: Right  [place];  Surgeon: Angelia Mould, MD;  Location: [place];  Service: Vascular;  Laterality: Right;  . CIRCUMCISION N/A 01/04/2014   Procedure: CIRCUMCISION ADULT (procedure #1);  Surgeon: Marissa Nestle, MD;  Location[facility];  Service: Urology;  Laterality: N/A;  . COLECTOMY N/A 05/04/2017   Procedure: TOTAL COLECTOMY;  Surgeon: Aviva Signs, MD;  Location: [facility];  Service: General;  Laterality: N/A;  . COLONOSCOPY N/A 04/27/2017   Procedure: COLONOSCOPY;  Surgeon: Daneil Dolin, MD;  [facility];  Service: Endoscopy;   Laterality: N/A;  245  . CYSTOSCOPY W/ RETROGRADES Bilateral 06/29/2015   Procedure: CYSTOSCOPY, DILATION OF URETHRAL STRICTURE WITH BILATERAL RETROGRADE PYELOGRAM,SUPRAPUBIC TUBE CHANGE;  Surgeon: Festus Aloe, MD;  Location: [facility];  Service: Urology;  Laterality: Bilateral;  . CYSTOSCOPY WITH URETHRAL DILATATION N/A 12/29/2013   Procedure: CYSTOSCOPY WITH URETHRAL DILATATION;  Surgeon: Marissa Nestle, MD;  [facility];  Service: Urology;  Laterality: N/A;  . ESOPHAGOGASTRODUODENOSCOPY N/A 04/27/2017   Procedure: ESOPHAGOGASTRODUODENOSCOPY (EGD);  Surgeon: Daneil Dolin, MD;  [facility];  Service: Endoscopy;  Laterality: N/A;  . INSERTION OF DIALYSIS CATHETER Right 11/25/2018   Procedure: INSERTION OF DIALYSIS CATHETER RIGHT INTERNAL JUGULAR;  Surgeon: Waynetta Sandy, MD;  Location: [place];  Service: Vascular;  Laterality: Right;  . IR AV DIALY SHUNT INTRO [place] W/PTA/IMG RIGHT Right 09/07/2018  . IR FLUORO GUIDE CV LINE RIGHT  10/16/2020  . IR REMOVAL TUN CV CATH W/O FL  01/12/2019  . IR THROMBECTOMY AV FISTULA W/THROMBOLYSIS/PTA INC/SHUNT/IMG RIGHT Right 04/26/2018  . IR US GUIDE VASC ACCESS RIGHT  04/26/2018  . IR US GUIDE VASC ACCESS RIGHT  09/07/2018  . IR US GUIDE VASC ACCESS RIGHT  10/16/2020  . IR US GUIDE VASC ACCESS RIGHT  10/16/2020  . ORIF FEMUR FRACTURE Right 11/22/2016   Procedure: OPEN REDUCTION INTERNAL FIXATION (ORIF) DISTAL FEMUR FRACTURE;  Surgeon: Rod Can, MD;  Location: [place];  Service: Orthopedics;  Laterality: Right;  . PATCH ANGIOPLASTY Right 12/10/2017   Procedure: PATCH ANGIOPLASTY;  Surgeon: Angelia Mould, MD;  [facility];  Service: Vascular;  Laterality: Right;  . PERIPHERAL VASCULAR BALLOON ANGIOPLASTY  08/13/2018   Procedure: PERIPHERAL VASCULAR BALLOON ANGIOPLASTY;  Surgeon: Elam Dutch, MD;  Location: [place] CV LAB;  Service: Cardiovascular;;  right AV fistula   . PERIPHERAL VASCULAR  BALLOON ANGIOPLASTY  11/22/2018   Procedure: PERIPHERAL VASCULAR BALLOON ANGIOPLASTY;  Surgeon: Waynetta Sandy, MD;  Location: [place] CV LAB;  Service: Cardiovascular;;  rt AV fistula  . PERIPHERAL VASCULAR CATHETERIZATION N/A 10/08/2015   Procedure: A/V Shuntogram;  Surgeon: Angelia Mould, MD;  Location: [place] CV LAB;  Service: Cardiovascular;  Laterality: N/A;  . THROMBECTOMY W/ EMBOLECTOMY Right 12/10/2017   Procedure: THROMBECTOMY REVISION RIGHT ARM  ARTERIOVENOUS FISTULA;  Surgeon: Angelia Mould, MD;  Location: [place];  Service: Vascular;  Laterality: Right;  . TRANSURETHRAL RESECTION OF PROSTATE N/A 01/04/2014   Procedure: TRANSURETHRAL RESECTION OF THE PROSTATE (TURP) (procedure #2);  Surgeon: Marissa Nestle, MD;  [facility];  Service: Urology;  Laterality: N/A;    Social History   Socioeconomic History  . Marital status: Single    Spouse name: Not on file  . Number of children: Not on file  . Years of education: Not on file  . Highest education level: Not on file  Occupational History  . Occupation: retired   Tobacco Use  .  Smoking status: Never Smoker  . Smokeless tobacco: Never Used  Vaping Use  . Vaping Use: Never used  Substance and Sexual Activity  . Alcohol use: No  . Drug use: No  . Sexual activity: Not Currently  Other Topics Concern  . 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   Long term resident of [facility]    Social Determinants of Health   Financial Resource Strain: Not on file  Food Insecurity: Not on file  Transportation Needs: Not on file  Physical Activity: Not on file  Stress: Not on file  Social Connections: Not on file  Intimate Partner Violence: Not on file   Family History  Problem Relation Age of Onset  . Cancer Mother   . Colon cancer Neg Hx       VITAL SIGNS BP 102/60   Pulse 64   Temp (!) 96.8 F (36 C)   Resp 20    Ht 5\' 8"  (1.727 m)   Wt 218 lb (98.9 kg)   BMI 33.15 kg/m   Outpatient Encounter Medications as of 12/27/2020  Medication Sig  . Amino Acids-Protein Hydrolys (FEEDING SUPPLEMENT, PRO-STAT 64,) LIQD Take 30 mLs by mouth 2 (two) times daily between meals.  Marland Kitchen atorvastatin (LIPITOR) 80 MG tablet Take 80 mg by mouth every evening.  . insulin aspart (NOVOLOG FLEXPEN) 100 UNIT/ML FlexPen Inject 20 Units into the skin daily. If cbg>150; subcutaneous  . insulin aspart (NOVOLOG) 100 UNIT/ML FlexPen Inject 15 Units into the skin 2 (two) times daily. Hold insulin on dialysis days if cbg's less than 150 on mon,wed,fri  . Insulin Glargine (BASAGLAR KWIKPEN) 100 UNIT/ML SOPN Inject 45 Units into the skin at bedtime.   Marland Kitchen JANUVIA 25 MG tablet Take 25 mg by mouth daily.   Marland Kitchen levothyroxine (SYNTHROID, LEVOTHROID) 88 MCG tablet Take 88 mcg by mouth daily before breakfast.  . midodrine (PROAMATINE) 10 MG tablet Take 10 mg by mouth. Send med with resident on Mon/Wed/Fri to dialysis. do not give [facility] give to resident to take with him. send with 5mg  to equal 15mg .  . midodrine (PROAMATINE) 5 MG tablet Take 5 mg by mouth. Send with resident on dialysis days.  Mon, Wed, Fri.  Take along with 10 mg to equal 15 mg  . NON FORMULARY 1200cc fluid restrictions. Dietary to provide 400cc/24hrs Nursing to give 7-3=140cc/24hrs 3-11=560cc/24hrs 11-7=100cc/24 Special Instructions: Document amount of fluids given each shift. Every Shift Day, Evening, Night  . NON FORMULARY Diet Type:  NAS, Cons CHO Diet  . omega-3 acid ethyl esters (LOVAZA) 1 g capsule Take 2 g by mouth at bedtime.  Marland Kitchen omeprazole (PRILOSEC) 40 MG capsule Take 40 mg by mouth daily.   . polyethylene glycol (MIRALAX / GLYCOLAX) packet Take 17 g by mouth daily as needed for mild constipation.   . sevelamer carbonate (RENVELA) 800 MG tablet Take 2,400 mg by mouth 3 (three) times daily with meals.  . tamsulosin (FLOMAX) 0.4 MG CAPS capsule Take 0.4 mg by mouth  every evening. Give 30 minutes after a meal  . torsemide (DEMADEX) 10 MG tablet Take 5 tablets (50 mg total) by mouth daily.   No facility-administered encounter medications on file as of 12/27/2020.     SIGNIFICANT DIAGNOSTIC EXAMS   LABS REVIEWED PREVIOUS;  04-09-20: wbc 6.2; hgb 10.7; hct 33.5; mcv 109.1 plt 144;glucose 185; bun 43; creat 10.75; k+ 4.3; na++ 137; ca 8.7 liver normal albumin  3.7 hgb a1c 7.5 07-04-20: PSA: 2.79  07-19-20: hgb a1c 7.7 09-10-20: wbc 11.8; hgb 12.9; hct 40.4; mcv 108.6 plt 163; glucose 225; bun 30;creat 8.92; k+ 3.8; na++ 138; ca 9.2 10-12-20: chol 76; ldl 64; trig 319; hdl 24; hgb a1c 7.4  10-17-20: wbc 8.2; hgb 11.0; hct 34.3; mcv 108.2 plt 121; glucose 141; bun 51; creat 13.25; k+ 3.9; na++ 138; ca 8.5 mag 2.1   NO NEW LABS.   Review of Systems  Constitutional: Negative for malaise/fatigue.  Respiratory: Negative for cough and shortness of breath.   Cardiovascular: Negative for chest pain, palpitations and leg swelling.  Gastrointestinal: Negative for abdominal pain, constipation and heartburn.  Musculoskeletal: Negative for back pain, joint pain and myalgias.  Skin: Negative.   Neurological: Negative for dizziness.  Psychiatric/Behavioral: The patient is not nervous/anxious.    Physical Exam Constitutional:      General: He is not in acute distress.    Appearance: He is well-developed and well-nourished. He is obese. He is not diaphoretic.  Neck:     Thyroid: No thyromegaly.  Cardiovascular:     Rate and Rhythm: Normal rate and regular rhythm.     Pulses: Intact distal pulses.     Heart sounds: Normal heart sounds.  Pulmonary:     Effort: Pulmonary effort is normal. No respiratory distress.     Breath sounds: Normal breath sounds.  Abdominal:     General: Bowel sounds are normal. There is no distension.     Palpations: Abdomen is soft.     Tenderness: There is no abdominal tenderness.  Genitourinary:    Comments: Hx turp Musculoskeletal:         General: No edema.     Right lower leg: No edema.     Left lower leg: No edema.     Comments: Is able to move all extremities History of right femur ORIF                    Lymphadenopathy:     Cervical: No cervical adenopathy.  Skin:    General: Skin is warm and dry.     Comments: Right upper extremity a/v fistula + thrill /bruit     Neurological:     Mental Status: He is alert. Mental status is at baseline.  Psychiatric:        Mood and Affect: Mood and affect and mood normal.       ASSESSMENT/ PLAN:  TODAY  1. Aortic atherosclerosis 2. Dependence on renal dialysis  3. End stage renal disease due to type 2 diabetes mellitus   Will continue current medications Will continue current plan of care Will continue to monitor his status.     Ok Edwards NP [facility]  Contact [phone] Monday through Friday 8am- 5pm  After hours call [phone]

## 2020-12-28 DIAGNOSIS — N186 End stage renal disease: Secondary | ICD-10-CM | POA: Diagnosis not present

## 2020-12-28 DIAGNOSIS — N2581 Secondary hyperparathyroidism of renal origin: Secondary | ICD-10-CM | POA: Diagnosis not present

## 2020-12-28 DIAGNOSIS — D631 Anemia in chronic kidney disease: Secondary | ICD-10-CM | POA: Diagnosis not present

## 2020-12-28 DIAGNOSIS — Z992 Dependence on renal dialysis: Secondary | ICD-10-CM | POA: Diagnosis not present

## 2020-12-28 DIAGNOSIS — D509 Iron deficiency anemia, unspecified: Secondary | ICD-10-CM | POA: Diagnosis not present

## 2020-12-31 DIAGNOSIS — D631 Anemia in chronic kidney disease: Secondary | ICD-10-CM | POA: Diagnosis not present

## 2020-12-31 DIAGNOSIS — D509 Iron deficiency anemia, unspecified: Secondary | ICD-10-CM | POA: Diagnosis not present

## 2020-12-31 DIAGNOSIS — N2581 Secondary hyperparathyroidism of renal origin: Secondary | ICD-10-CM | POA: Diagnosis not present

## 2020-12-31 DIAGNOSIS — N186 End stage renal disease: Secondary | ICD-10-CM | POA: Diagnosis not present

## 2020-12-31 DIAGNOSIS — Z992 Dependence on renal dialysis: Secondary | ICD-10-CM | POA: Diagnosis not present

## 2021-01-02 DIAGNOSIS — D509 Iron deficiency anemia, unspecified: Secondary | ICD-10-CM | POA: Diagnosis not present

## 2021-01-02 DIAGNOSIS — N186 End stage renal disease: Secondary | ICD-10-CM | POA: Diagnosis not present

## 2021-01-02 DIAGNOSIS — D631 Anemia in chronic kidney disease: Secondary | ICD-10-CM | POA: Diagnosis not present

## 2021-01-02 DIAGNOSIS — N2581 Secondary hyperparathyroidism of renal origin: Secondary | ICD-10-CM | POA: Diagnosis not present

## 2021-01-02 DIAGNOSIS — Z992 Dependence on renal dialysis: Secondary | ICD-10-CM | POA: Diagnosis not present

## 2021-01-04 DIAGNOSIS — D509 Iron deficiency anemia, unspecified: Secondary | ICD-10-CM | POA: Diagnosis not present

## 2021-01-04 DIAGNOSIS — N2581 Secondary hyperparathyroidism of renal origin: Secondary | ICD-10-CM | POA: Diagnosis not present

## 2021-01-04 DIAGNOSIS — Z992 Dependence on renal dialysis: Secondary | ICD-10-CM | POA: Diagnosis not present

## 2021-01-04 DIAGNOSIS — N186 End stage renal disease: Secondary | ICD-10-CM | POA: Diagnosis not present

## 2021-01-04 DIAGNOSIS — D631 Anemia in chronic kidney disease: Secondary | ICD-10-CM | POA: Diagnosis not present

## 2021-01-07 DIAGNOSIS — D509 Iron deficiency anemia, unspecified: Secondary | ICD-10-CM | POA: Diagnosis not present

## 2021-01-07 DIAGNOSIS — D631 Anemia in chronic kidney disease: Secondary | ICD-10-CM | POA: Diagnosis not present

## 2021-01-07 DIAGNOSIS — N186 End stage renal disease: Secondary | ICD-10-CM | POA: Diagnosis not present

## 2021-01-07 DIAGNOSIS — N2581 Secondary hyperparathyroidism of renal origin: Secondary | ICD-10-CM | POA: Diagnosis not present

## 2021-01-07 DIAGNOSIS — Z992 Dependence on renal dialysis: Secondary | ICD-10-CM | POA: Diagnosis not present

## 2021-01-08 ENCOUNTER — Encounter: Payer: Self-pay | Admitting: Adult Health

## 2021-01-08 ENCOUNTER — Non-Acute Institutional Stay (SKILLED_NURSING_FACILITY): Payer: Medicare Other | Admitting: Adult Health

## 2021-01-08 DIAGNOSIS — Z0000 Encounter for general adult medical examination without abnormal findings: Secondary | ICD-10-CM | POA: Diagnosis not present

## 2021-01-08 NOTE — Patient Instructions (Signed)
   [name] , Thank you for taking time to come for your Medicare Wellness Visit. I appreciate your ongoing commitment to your health goals. Please review the following plan we discussed and let me know if I can assist you in the future.   These are the goals we discussed: Goals    . Absence of Fall and Fall-Related Injury     Evidence-based guidance:   Assess fall risk using a validated tool when available. Consider balance and gait impairment, muscle weakness, diminished vision or hearing, environmental hazards, presence of urinary or bowel urgency and/or incontinence.   Communicate fall injury risk to interprofessional healthcare team.   Develop a fall prevention plan with the patient and family.   Promote use of personal vision and auditory aids.   Promote reorientation, appropriate sensory stimulation, and routines to decrease risk of fall when changes in mental status are present.   Assess assistance level required for safe and effective self-care; consider referral for home care.   Encourage physical activity, such as performance of self-care at highest level of ability, strength and balance exercise program, and provision of appropriate assistive devices; refer to rehabilitation therapy.   Refer to community-based fall prevention program where available.   If fall occurs, determine the cause and revise fall injury prevention plan.   Regularly review medication contribution to fall risk; consider risk related to polypharmacy and age.   Refer to pharmacist for consultation when concerns about medications are revealed.   Balance adequate pain management with potential for oversedation.   Provide guidance related to environmental modifications.   Consider supplementation with Vitamin D.   Notes:     . Follow up with Provider as scheduled    . General - Client will not be readmitted within 30 days ([facility])       This is a list of the screening recommended for you and due  dates:  Health Maintenance  Topic Date Due  . Eye exam for diabetics  09/07/2020  . COVID-19 Vaccine (4 - Booster for Moderna series) 02/27/2021  . Hemoglobin A1C  04/12/2021  . Complete foot exam   08/15/2021  . Colon Cancer Screening  04/28/2027  . Tetanus Vaccine  08/23/2027  . Flu Shot  Completed  .  Hepatitis C: One time screening is recommended by Center for Disease Control  (CDC) for  adults born from [birth year] through 1963.   Completed  . Pneumonia vaccines  Completed  . HPV Vaccine  Aged Out  . Urine Protein Check  Discontinued

## 2021-01-08 NOTE — Progress Notes (Signed)
Subjective:   Zachary [name] is a 75 y.o. male who presents for Medicare Annual/Subsequent preventive examination.  Cardiac Risk Factors include: advanced age (>71men, >95 women);diabetes mellitus;sedentary lifestyle;male gender;obesity (BMI >30kg/m2) Review of Systems  Constitutional: Negative for malaise/fatigue.  Respiratory: Negative for cough and shortness of breath.   Cardiovascular: Negative for chest pain, palpitations and leg swelling.  Gastrointestinal: Negative for abdominal pain, constipation and heartburn.  Musculoskeletal: Negative for back pain, joint pain and myalgias.  Skin: Negative.   Neurological: Negative for dizziness.  Psychiatric/Behavioral: The patient is not nervous/anxious.         Objective:    Today's Vitals   01/08/21 0917 01/08/21 1342  BP: (!) 97/49   Pulse: 94   Resp: 20   Temp: (!) 97.3 F (36.3 C)   SpO2: 100%   Weight: 218 lb 3.2 oz (99 kg)   Height: 5\' 8"  (1.727 m)   PainSc:  0-No pain   Body mass index is 33.18 kg/m.  Advanced Directives 01/08/2021 12/27/2020 12/12/2020 11/13/2020 10/18/2020 10/17/2020 10/17/2020  Does Patient Have a Medical Advance Directive? No No No No No Yes No  Type of Advance Directive - - - - - - -  Does patient want to make changes to medical advance directive? No - Patient declined No - Patient declined No - Patient declined No - Patient declined No - Patient declined No - Patient declined -  Copy of [place] in [place]  Would patient like information on creating a medical advance directive? - - - - - No - Patient declined -  Pre-existing out of facility DNR order (yellow form or pink MOST form) - - - - - - -    Current Medications (verified) Outpatient Encounter Medications as of 01/08/2021  Medication Sig  . Amino Acids-Protein Hydrolys (FEEDING SUPPLEMENT, PRO-STAT 64,) LIQD Take 30 mLs by mouth 2 (two) times daily between meals.  Marland Kitchen atorvastatin (LIPITOR) 80 MG tablet Take 80  mg by mouth every evening.  . insulin aspart (NOVOLOG FLEXPEN) 100 UNIT/ML FlexPen Inject 20 Units into the skin daily. If cbg>150; subcutaneous  . insulin aspart (NOVOLOG) 100 UNIT/ML FlexPen Inject 15 Units into the skin 2 (two) times daily. Hold insulin on dialysis days if cbg's less than 150 on mon,wed,fri  . Insulin Glargine (BASAGLAR KWIKPEN) 100 UNIT/ML SOPN Inject 45 Units into the skin at bedtime.   Marland Kitchen JANUVIA 25 MG tablet Take 25 mg by mouth daily.   Marland Kitchen latanoprost (XALATAN) 0.005 % ophthalmic solution Place 1 drop into both eyes at bedtime. 9 pm for glaucoma  . levothyroxine (SYNTHROID, LEVOTHROID) 88 MCG tablet Take 88 mcg by mouth daily before breakfast.  . midodrine (PROAMATINE) 10 MG tablet Take 10 mg by mouth. Send med with resident on Mon/Wed/Fri to dialysis. do not give [facility] give to resident to take with him. send with 5mg  to equal 15mg .  . midodrine (PROAMATINE) 5 MG tablet Take 5 mg by mouth. Send with resident on dialysis days.  Mon, Wed, Fri.  Take along with 10 mg to equal 15 mg  . NON FORMULARY 1200cc fluid restrictions. Dietary to provide 400cc/24hrs Nursing to give 7-3=140cc/24hrs 3-11=560cc/24hrs 11-7=100cc/24 Special Instructions: Document amount of fluids given each shift. Every Shift Day, Evening, Night  . NON FORMULARY Diet Type:  NAS, Cons CHO Diet  . omega-3 acid ethyl esters (LOVAZA) 1 g capsule Take 2 g by mouth at bedtime.  Marland Kitchen  omeprazole (PRILOSEC) 40 MG capsule Take 40 mg by mouth daily.   . polyethylene glycol (MIRALAX / GLYCOLAX) packet Take 17 g by mouth daily as needed for mild constipation.   . sevelamer carbonate (RENVELA) 800 MG tablet Take 2,400 mg by mouth 3 (three) times daily with meals.  . tamsulosin (FLOMAX) 0.4 MG CAPS capsule Take 0.4 mg by mouth every evening. Give 30 minutes after a meal  . torsemide (DEMADEX) 10 MG tablet Take 5 tablets (50 mg total) by mouth daily.   No facility-administered encounter medications on file as of  01/08/2021.    Allergies (verified) No known allergies   History: Past Medical History:  Diagnosis Date  . Abnormal CT scan, kidney 10/06/2011  . Acute pyelonephritis 10/07/2011  . Anemia    normocytic  . Anxiety    mental retardation  . Bladder wall thickening 10/06/2011  . BPH (benign prostatic hypertrophy)   . Diabetes mellitus   . Dialysis patient [facility])    Tuesday, Thursday and Saturday,   . DVT of leg (deep venous thrombosis) ([place]) 12/25/2016  . Edema     history of lower extremity edema  . GERD (gastroesophageal reflux disease)   . Heme positive stool   . Hydronephrosis   . Hyperkalemia   . Hyperlipidemia   . Hypernatremia   . Hypertension   . Hypothyroidism   . Impaired speech   . 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 MR (mental retardation)   . Muscle weakness   . Obstructive uropathy   . 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 Poor historian poor historian  . Protein calorie malnutrition ([place])   . Pyelonephritis   . Renal failure (ARF), acute on chronic (HCC)   . Renal insufficiency    chronic history  . Sepsis ([place])   . Smoking   . Uremia   . Urinary retention   . UTI (lower urinary tract infection) 10/06/2011   Past Surgical History:  Procedure Laterality Date  . A/V FISTULAGRAM N/A 08/13/2018   Procedure: A/V FISTULAGRAM - Right Upper;  Surgeon: Elam Dutch, MD;  Location: [place] [facility];  Service: Cardiovascular;  Laterality: N/A;  . A/V FISTULAGRAM N/A 11/22/2018   Procedure: A/V FISTULAGRAM - Right Upper;  Surgeon: Waynetta Sandy, MD;  Location: [place] [facility];  Service: Cardiovascular;  Laterality: N/A;  . AV FISTULA PLACEMENT Left 07/06/2015   Procedure:  INSERTION LEFT ARM ARTERIOVENOUS GORTEX GRAFT;  Surgeon: Angelia Mould, MD;  Location: [place];  Service: Vascular;  Laterality: Left;  . AV FISTULA PLACEMENT Right 02/26/2016   Procedure: ARTERIOVENOUS (AV) FISTULA CREATION ;  Surgeon: Angelia Mould, MD;   Location: [place];  Service: Vascular;  Laterality: Right;  . AV FISTULA PLACEMENT Right 11/25/2018   Procedure: INSERTION OF ARTERIOVENOUS (AV) ARTEGRAFT RIGHT UPPER ARM;  Surgeon: Waynetta Sandy, MD;  Location: [place];  Service: Vascular;  Laterality: Right;  . [place] REMOVAL Left 10/09/2015   Procedure: REMOVAL OF ARTERIOVENOUS GORETEX GRAFT ([place]) Evacuation of Lymphocele, Vein Patch angioplasty of brachial artery.;  Surgeon: Angelia Mould, MD;  Location: [place];  Service: Vascular;  Laterality: Left;  . BASCILIC VEIN TRANSPOSITION Right 02/26/2016   Procedure: Right BASCILIC VEIN TRANSPOSITION;  Surgeon: Angelia Mould, MD;  Location: [place];  Service: Vascular;  Laterality: Right;  . CIRCUMCISION N/A 01/04/2014   Procedure: CIRCUMCISION ADULT (procedure #1);  Surgeon: Marissa Nestle, MD;  [facility];  Service: Urology;  Laterality: N/A;  .  COLECTOMY N/A 05/04/2017   Procedure: TOTAL COLECTOMY;  Surgeon: Aviva Signs, MD;  [facility];  Service: General;  Laterality: N/A;  . COLONOSCOPY N/A 04/27/2017   Procedure: COLONOSCOPY;  Surgeon: Daneil Dolin, MD;  [facility];  Service: Endoscopy;  Laterality: N/A;  245  . CYSTOSCOPY W/ RETROGRADES Bilateral 06/29/2015   Procedure: CYSTOSCOPY, DILATION OF URETHRAL STRICTURE WITH BILATERAL RETROGRADE PYELOGRAM,SUPRAPUBIC TUBE CHANGE;  Surgeon: Festus Aloe, MD;  [facility];  Service: Urology;  Laterality: Bilateral;  . CYSTOSCOPY WITH URETHRAL DILATATION N/A 12/29/2013   Procedure: CYSTOSCOPY WITH URETHRAL DILATATION;  Surgeon: Marissa Nestle, MD;  [facility];  Service: Urology;  Laterality: N/A;  . ESOPHAGOGASTRODUODENOSCOPY N/A 04/27/2017   Procedure: ESOPHAGOGASTRODUODENOSCOPY (EGD);  Surgeon: Daneil Dolin, MD;  [facility];  Service: Endoscopy;  Laterality: N/A;  . INSERTION OF DIALYSIS CATHETER Right 11/25/2018   Procedure: INSERTION OF DIALYSIS CATHETER RIGHT INTERNAL JUGULAR;   Surgeon: Waynetta Sandy, MD;  Location: [place];  Service: Vascular;  Laterality: Right;  . IR AV DIALY SHUNT INTRO [place] W/PTA/IMG RIGHT Right 09/07/2018  . IR FLUORO GUIDE CV LINE RIGHT  10/16/2020  . IR REMOVAL TUN CV CATH W/O FL  01/12/2019  . IR THROMBECTOMY AV FISTULA W/THROMBOLYSIS/PTA INC/SHUNT/IMG RIGHT Right 04/26/2018  . IR US GUIDE VASC ACCESS RIGHT  04/26/2018  . IR US GUIDE VASC ACCESS RIGHT  09/07/2018  . IR US GUIDE VASC ACCESS RIGHT  10/16/2020  . IR US GUIDE VASC ACCESS RIGHT  10/16/2020  . ORIF FEMUR FRACTURE Right 11/22/2016   Procedure: OPEN REDUCTION INTERNAL FIXATION (ORIF) DISTAL FEMUR FRACTURE;  Surgeon: Rod Can, MD;  Location: [place];  Service: Orthopedics;  Laterality: Right;  . PATCH ANGIOPLASTY Right 12/10/2017   Procedure: PATCH ANGIOPLASTY;  Surgeon: Angelia Mould, MD;  [facility];  Service: Vascular;  Laterality: Right;  . PERIPHERAL VASCULAR BALLOON ANGIOPLASTY  08/13/2018   Procedure: PERIPHERAL VASCULAR BALLOON ANGIOPLASTY;  Surgeon: Elam Dutch, MD;  Location[facility] [place] [facility];  Service: Cardiovascular;;  right AV fistula   . PERIPHERAL VASCULAR BALLOON ANGIOPLASTY  11/22/2018   Procedure: PERIPHERAL VASCULAR BALLOON ANGIOPLASTY;  Surgeon: Waynetta Sandy, MD;  Location: [place] [facility];  Service: Cardiovascular;;  rt AV fistula  . PERIPHERAL VASCULAR CATHETERIZATION N/A 10/08/2015   Procedure: A/V Shuntogram;  Surgeon: Angelia Mould, MD;  Location: [place] [facility];  Service: Cardiovascular;  Laterality: N/A;  . THROMBECTOMY W/ EMBOLECTOMY Right 12/10/2017   Procedure: THROMBECTOMY REVISION RIGHT ARM  ARTERIOVENOUS FISTULA;  Surgeon: Angelia Mould, MD;  Location: [place];  Service: Vascular;  Laterality: Right;  . TRANSURETHRAL RESECTION OF PROSTATE N/A 01/04/2014   Procedure: TRANSURETHRAL RESECTION OF THE PROSTATE (TURP) (procedure #2);  Surgeon: Marissa Nestle, MD;  [facility];  Service: Urology;  Laterality: N/A;   Family History  Problem Relation Age of Onset  . Cancer Mother   . Colon cancer Neg Hx    Social History   Socioeconomic History  . Marital status: Single    Spouse name: Not on file  . Number of children: Not on file  . Years of education: Not on file  . Highest education level: Not on file  Occupational History  . Occupation: retired   Tobacco Use  . Smoking status: Never Smoker  . Smokeless tobacco: Never Used  Vaping Use  . Vaping Use: Never used  Substance and Sexual Activity  . Alcohol  use: No  . Drug use: No  . Sexual activity: Not Currently  Other Topics Concern  . 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   [facility]    Social Determinants of Health   Financial Resource Strain: Not on file  Food Insecurity: Not on file  Transportation Needs: Not on file  Physical Activity: Not on file  Stress: Not on file  Social Connections: Not on file    Tobacco Counseling Counseling given: Not Answered   Clinical Intake:  Pre-visit preparation completed: Yes  Pain : No/denies pain Pain Score: 0-No pain     BMI - recorded: 33.18 Nutritional Status: BMI > 30  Obese Diabetes: Yes CBG done?: Yes CBG resulted in Enter/ Edit results?: Yes Did pt. bring in CBG monitor from home?: No  How often do you need to have someone help you when you read instructions, pamphlets, or other written materials from your doctor or pharmacy?: 4 - Often  Diabetic?yes  Interpreter Needed?: No      Activities of Daily Living In your present state of health, do you have any difficulty performing the following activities: 01/08/2021 10/16/2020  Hearing? N N  Vision? N -  Difficulty concentrating or making decisions? Tempie Donning  Walking or climbing stairs? Y Y  Dressing or bathing? Y Y  Doing errands, shopping? Y -  Conservation officer, nature and eating ? Y  -  Using the Toilet? Y -  In the past six months, have you accidently leaked urine? Y -  Do you have problems with loss of bowel control? Y -  Managing your Medications? Y -  Managing your Finances? Y -  Housekeeping or managing your Housekeeping? Y -  Some recent data might be hidden    Patient Care Team: Gerlene Fee, NP as PCP - General (Geriatric Medicine) Fran Lowes, MD (Inactive) as Consulting Physician (Nephrology) Gala Romney Cristopher Estimable, MD as Consulting Physician (Gastroenterology) [facility] [place] [facility])  Indicate any recent Medical Services you may have received from other than [facility] providers in the past year (date may be approximate).     Assessment:   This is a routine wellness examination for [place].  Hearing/Vision screen  Hearing Screening   125Hz  250Hz  500Hz  1000Hz  2000Hz  3000Hz  4000Hz  6000Hz  8000Hz   Right ear:           Left ear:           Vision Screening Comments: Last eye exam 01/01/2021, Doctor Fredda Hammed OD  Dietary issues and exercise activities discussed: Current Exercise Habits: The patient does not participate in regular exercise at present  Goals    . Absence of Fall and Fall-Related Injury     Evidence-based guidance:   Assess fall risk using a validated tool when available. Consider balance and gait impairment, muscle weakness, diminished vision or hearing, environmental hazards, presence of urinary or bowel urgency and/or incontinence.   Communicate fall injury risk to interprofessional healthcare team.   Develop a fall prevention plan with the patient and family.   Promote use of personal vision and auditory aids.   Promote reorientation, appropriate sensory stimulation, and routines to decrease risk of fall when changes in mental status are present.   Assess assistance level required for safe and effective self-care; consider referral for home care.   Encourage physical activity, such as performance of  self-care at highest level of ability, strength and balance exercise  program, and provision of appropriate assistive devices; refer to rehabilitation therapy.   Refer to community-based fall prevention program where available.   If fall occurs, determine the cause and revise fall injury prevention plan.   Regularly review medication contribution to fall risk; consider risk related to polypharmacy and age.   Refer to pharmacist for consultation when concerns about medications are revealed.   Balance adequate pain management with potential for oversedation.   Provide guidance related to environmental modifications.   Consider supplementation with Vitamin D.   Notes:     . Follow up with Provider as scheduled    . General - Client will not be readmitted within 30 days ([facility])      Depression Screen PHQ 2/9 Scores 01/08/2021 01/19/2020 01/09/2020 09/15/2019 06/04/2019 02/18/2018  PHQ - 2 Score 0 0 0 0 0 0    Fall Risk Fall Risk  01/08/2021 01/19/2020 01/09/2020 09/15/2019 06/04/2019  Falls in the past year? 0 0 0 0 -  Comment - - - - -  Number falls in past yr: 0 - - - 0  Comment - - - - -  Injury with Fall? 0 - - - -  Risk for fall due to : Impaired balance/gait Impaired balance/gait;Impaired mobility Impaired mobility - Impaired mobility    FALL RISK PREVENTION PERTAINING TO THE HOME:  Any stairs in or around the home? no If so, are there any without handrails?yes  Home free of loose throw rugs in walkways, pet beds, electrical cords, etc? yes Adequate lighting in your home to reduce risk of falls?yes  ASSISTIVE DEVICES UTILIZED TO PREVENT FALLS:  Life alert? n/a Use of a cane, walker or w/c? w/c Grab bars in the bathroom? Yes  Shower chair or bench in shower? Yes  Elevated toilet seat or a handicapped toilet? Yes   TIMED UP AND GO:  Was the test performed? no .   Wheelchair bound   Cognitive Function:     6CIT Screen 01/08/2021 02/18/2018  What Year? 0 points 4 points   What month? 0 points 3 points  What time? 0 points 3 points  Count back from 20 2 points 4 points  Months in reverse 4 points 4 points  Repeat phrase 4 points 10 points  Total Score 10 28    Immunizations Immunization History  Administered Date(s) Administered  . Hepatitis B, adult 09/30/2016, 10/28/2016, 12/02/2016, 03/28/2017, 07/02/2017, 08/13/2017, 09/12/2017, 01/14/2018  . Influenza,inj,Quad PF,6+ Mos 12/26/2013, 07/09/2015  . Influenza,inj,Quad PF,6-35 Mos 08/08/2019  . Influenza-Unspecified 07/28/2016, 07/31/2017, 07/29/2018, 08/08/2019, 07/20/2020, 08/03/2020  . Moderna Sars-Covid-2 Vaccination 11/02/2019, 11/30/2019, 08/30/2020  . PPD Test 03/07/2015, 08/08/2016, 05/15/2017, 08/31/2018, 10/10/2019, 10/10/2020  . Pneumococcal Conjugate-13 07/31/2017  . Pneumococcal Polysaccharide-23 03/04/2015  . Pneumococcal-Unspecified 06/12/2015, 12/03/2016  . Tdap 08/22/2017   Vaccines per facility   Screening Tests Health Maintenance  Topic Date Due  . OPHTHALMOLOGY EXAM  09/07/2020  . COVID-19 Vaccine (4 - Booster for Moderna series) 02/27/2021  . HEMOGLOBIN A1C  04/12/2021  . FOOT EXAM  08/15/2021  . COLONOSCOPY (Pts 45-70yrs Insurance coverage will need to be confirmed)  04/28/2027  . TETANUS/TDAP  08/23/2027  . INFLUENZA VACCINE  Completed  . Hepatitis C Screening  Completed  . PNA vac Low Risk Adult  Completed  . HPV VACCINES  Aged Out  . URINE MICROALBUMIN  Discontinued    Health Maintenance  Health Maintenance Due  Topic Date Due  . OPHTHALMOLOGY EXAM  09/07/2020     Lung Cancer Screening: (Low  Dose CT Chest recommended if Age 54-80 years, 30 pack-year currently smoking OR have quit w/in 15years.) does not  qualify.   Lung Cancer Screening Referral: n/a  Additional Screening:  Hepatitis C Screening: per facility   Vision Screening: Recommended annual ophthalmology exams for early detection of glaucoma and other disorders of the eye. Is the patient up to  date with their annual eye exam?  yes  Who is the provider or what is the name of the office in which the patient attends annual eye exams? At facility  If pt is not established with a provider, would they like to be referred to a provider to establish care? no.   Dental Screening: Recommended annual dental exams for proper oral hygiene  Community Resource Referral / Chronic Care Management: CRR required this visit? no   CCM required this visit?  no     Plan:     I have personally reviewed and noted the following in the patient's chart:   . Medical and social history . Use of alcohol, tobacco or illicit drugs  . Current medications and supplements . Functional ability and status . Nutritional status . Physical activity . Advanced directives . List of other physicians . Hospitalizations, surgeries, and ER visits in previous 12 months . Vitals . Screenings to include cognitive, depression, and falls . Referrals and appointments  In addition, I have reviewed and discussed with patient certain preventive protocols, quality metrics, and best practice recommendations. A written personalized care plan for preventive services as well as general preventive health recommendations were provided to patient.     Gerlene Fee, NP   01/08/2021

## 2021-01-09 DIAGNOSIS — D631 Anemia in chronic kidney disease: Secondary | ICD-10-CM | POA: Diagnosis not present

## 2021-01-09 DIAGNOSIS — N2581 Secondary hyperparathyroidism of renal origin: Secondary | ICD-10-CM | POA: Diagnosis not present

## 2021-01-09 DIAGNOSIS — D509 Iron deficiency anemia, unspecified: Secondary | ICD-10-CM | POA: Diagnosis not present

## 2021-01-09 DIAGNOSIS — N186 End stage renal disease: Secondary | ICD-10-CM | POA: Diagnosis not present

## 2021-01-09 DIAGNOSIS — Z992 Dependence on renal dialysis: Secondary | ICD-10-CM | POA: Diagnosis not present

## 2021-01-11 DIAGNOSIS — N2581 Secondary hyperparathyroidism of renal origin: Secondary | ICD-10-CM | POA: Diagnosis not present

## 2021-01-11 DIAGNOSIS — N186 End stage renal disease: Secondary | ICD-10-CM | POA: Diagnosis not present

## 2021-01-11 DIAGNOSIS — Z992 Dependence on renal dialysis: Secondary | ICD-10-CM | POA: Diagnosis not present

## 2021-01-11 DIAGNOSIS — D631 Anemia in chronic kidney disease: Secondary | ICD-10-CM | POA: Diagnosis not present

## 2021-01-11 DIAGNOSIS — D509 Iron deficiency anemia, unspecified: Secondary | ICD-10-CM | POA: Diagnosis not present

## 2021-01-14 DIAGNOSIS — D509 Iron deficiency anemia, unspecified: Secondary | ICD-10-CM | POA: Diagnosis not present

## 2021-01-14 DIAGNOSIS — D631 Anemia in chronic kidney disease: Secondary | ICD-10-CM | POA: Diagnosis not present

## 2021-01-14 DIAGNOSIS — N186 End stage renal disease: Secondary | ICD-10-CM | POA: Diagnosis not present

## 2021-01-14 DIAGNOSIS — Z992 Dependence on renal dialysis: Secondary | ICD-10-CM | POA: Diagnosis not present

## 2021-01-14 DIAGNOSIS — N2581 Secondary hyperparathyroidism of renal origin: Secondary | ICD-10-CM | POA: Diagnosis not present

## 2021-01-14 DIAGNOSIS — R5383 Other fatigue: Secondary | ICD-10-CM | POA: Diagnosis not present

## 2021-01-15 DIAGNOSIS — B351 Tinea unguium: Secondary | ICD-10-CM | POA: Diagnosis not present

## 2021-01-15 DIAGNOSIS — Z794 Long term (current) use of insulin: Secondary | ICD-10-CM | POA: Diagnosis not present

## 2021-01-15 DIAGNOSIS — L603 Nail dystrophy: Secondary | ICD-10-CM | POA: Diagnosis not present

## 2021-01-15 DIAGNOSIS — M2142 Flat foot [pes planus] (acquired), left foot: Secondary | ICD-10-CM | POA: Diagnosis not present

## 2021-01-15 DIAGNOSIS — E1151 Type 2 diabetes mellitus with diabetic peripheral angiopathy without gangrene: Secondary | ICD-10-CM | POA: Diagnosis not present

## 2021-01-16 DIAGNOSIS — D631 Anemia in chronic kidney disease: Secondary | ICD-10-CM | POA: Diagnosis not present

## 2021-01-16 DIAGNOSIS — D509 Iron deficiency anemia, unspecified: Secondary | ICD-10-CM | POA: Diagnosis not present

## 2021-01-16 DIAGNOSIS — N2581 Secondary hyperparathyroidism of renal origin: Secondary | ICD-10-CM | POA: Diagnosis not present

## 2021-01-16 DIAGNOSIS — Z992 Dependence on renal dialysis: Secondary | ICD-10-CM | POA: Diagnosis not present

## 2021-01-16 DIAGNOSIS — N186 End stage renal disease: Secondary | ICD-10-CM | POA: Diagnosis not present

## 2021-01-18 DIAGNOSIS — D509 Iron deficiency anemia, unspecified: Secondary | ICD-10-CM | POA: Diagnosis not present

## 2021-01-18 DIAGNOSIS — N2581 Secondary hyperparathyroidism of renal origin: Secondary | ICD-10-CM | POA: Diagnosis not present

## 2021-01-18 DIAGNOSIS — N186 End stage renal disease: Secondary | ICD-10-CM | POA: Diagnosis not present

## 2021-01-18 DIAGNOSIS — Z992 Dependence on renal dialysis: Secondary | ICD-10-CM | POA: Diagnosis not present

## 2021-01-18 DIAGNOSIS — D631 Anemia in chronic kidney disease: Secondary | ICD-10-CM | POA: Diagnosis not present

## 2021-01-21 DIAGNOSIS — D509 Iron deficiency anemia, unspecified: Secondary | ICD-10-CM | POA: Diagnosis not present

## 2021-01-21 DIAGNOSIS — D631 Anemia in chronic kidney disease: Secondary | ICD-10-CM | POA: Diagnosis not present

## 2021-01-21 DIAGNOSIS — N2581 Secondary hyperparathyroidism of renal origin: Secondary | ICD-10-CM | POA: Diagnosis not present

## 2021-01-21 DIAGNOSIS — N186 End stage renal disease: Secondary | ICD-10-CM | POA: Diagnosis not present

## 2021-01-21 DIAGNOSIS — Z992 Dependence on renal dialysis: Secondary | ICD-10-CM | POA: Diagnosis not present

## 2021-01-23 ENCOUNTER — Non-Acute Institutional Stay (SKILLED_NURSING_FACILITY): Payer: Medicare Other | Admitting: Adult Health

## 2021-01-23 ENCOUNTER — Encounter: Payer: Self-pay | Admitting: Adult Health

## 2021-01-23 DIAGNOSIS — E1169 Type 2 diabetes mellitus with other specified complication: Secondary | ICD-10-CM | POA: Diagnosis not present

## 2021-01-23 DIAGNOSIS — E785 Hyperlipidemia, unspecified: Secondary | ICD-10-CM

## 2021-01-23 DIAGNOSIS — D631 Anemia in chronic kidney disease: Secondary | ICD-10-CM | POA: Diagnosis not present

## 2021-01-23 DIAGNOSIS — D509 Iron deficiency anemia, unspecified: Secondary | ICD-10-CM | POA: Diagnosis not present

## 2021-01-23 DIAGNOSIS — N186 End stage renal disease: Secondary | ICD-10-CM | POA: Diagnosis not present

## 2021-01-23 DIAGNOSIS — K219 Gastro-esophageal reflux disease without esophagitis: Secondary | ICD-10-CM | POA: Diagnosis not present

## 2021-01-23 DIAGNOSIS — Z85038 Personal history of other malignant neoplasm of large intestine: Secondary | ICD-10-CM

## 2021-01-23 DIAGNOSIS — Z992 Dependence on renal dialysis: Secondary | ICD-10-CM | POA: Diagnosis not present

## 2021-01-23 DIAGNOSIS — N2581 Secondary hyperparathyroidism of renal origin: Secondary | ICD-10-CM | POA: Diagnosis not present

## 2021-01-23 NOTE — Progress Notes (Signed)
Location:  [place] Room Number: 112/D Place of Service:  SNF (31)   CODE STATUS: Full Code  Allergies  Allergen Reactions  . No Known Allergies     Chief Complaint  Patient presents with  . Medical Management of Chronic Issues          GERD without esophagitis:  History of colon cancer:   Dyslipidemia associated with type 2 diabetes mellitus:      HPI:  Zachary [name] is a 75 year old long term resident of this facility being seen for the management of his chronic illnesses: GERD without esophagitis:  History of colon cancer:   Dyslipidemia associated with type 2 diabetes mellitus. There are no reports of uncontrolled pain; no reports of heart burn; no reports of agitation or anxiety. Zachary [name] does not adhere to a fluid restriction.   Past Medical History:  Diagnosis Date  . Abnormal CT scan, kidney 10/06/2011  . Acute pyelonephritis 10/07/2011  . Anemia    normocytic  . Anxiety    mental retardation  . Bladder wall thickening 10/06/2011  . BPH (benign prostatic hypertrophy)   . Diabetes mellitus   . Dialysis patient [facility])    Tuesday, Thursday and Saturday,   . DVT of leg (deep venous thrombosis) ([place]) 12/25/2016  . Edema     history of lower extremity edema  . GERD (gastroesophageal reflux disease)   . Heme positive stool   . Hydronephrosis   . Hyperkalemia   . Hyperlipidemia   . Hypernatremia   . Hypertension   . Hypothyroidism   . Impaired speech   . 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 MR (mental retardation)   . Muscle weakness   . Obstructive uropathy   . 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 Poor historian poor historian  . Protein calorie malnutrition ([place])   . Pyelonephritis   . Renal failure (ARF), acute on chronic (HCC)   . Renal insufficiency    chronic history  . Sepsis ([place])   . Smoking   . Uremia   . Urinary retention   . UTI (lower urinary tract infection) 10/06/2011    Past Surgical History:  Procedure Laterality Date  .  A/V FISTULAGRAM N/A 08/13/2018   Procedure: A/V FISTULAGRAM - Right Upper;  Surgeon: Elam Dutch, MD;  Location: [place] [facility];  Service: Cardiovascular;  Laterality: N/A;  . A/V FISTULAGRAM N/A 11/22/2018   Procedure: A/V FISTULAGRAM - Right Upper;  Surgeon: Waynetta Sandy, MD;  Location: [place] [facility];  Service: Cardiovascular;  Laterality: N/A;  . AV FISTULA PLACEMENT Left 07/06/2015   Procedure:  INSERTION LEFT ARM ARTERIOVENOUS GORTEX GRAFT;  Surgeon: Angelia Mould, MD;  Location: [place];  Service: Vascular;  Laterality: Left;  . AV FISTULA PLACEMENT Right 02/26/2016   Procedure: ARTERIOVENOUS (AV) FISTULA CREATION ;  Surgeon: Angelia Mould, MD;  Location: [place];  Service: Vascular;  Laterality: Right;  . AV FISTULA PLACEMENT Right 11/25/2018   Procedure: INSERTION OF ARTERIOVENOUS (AV) ARTEGRAFT RIGHT UPPER ARM;  Surgeon: Waynetta Sandy, MD;  Location: [place];  Service: Vascular;  Laterality: Right;  . [place] REMOVAL Left 10/09/2015   Procedure: REMOVAL OF ARTERIOVENOUS GORETEX GRAFT ([place]) Evacuation of Lymphocele, Vein Patch angioplasty of brachial artery.;  Surgeon: Angelia Mould, MD;  Location: [place];  Service: Vascular;  Laterality: Left;  . BASCILIC VEIN TRANSPOSITION Right 02/26/2016   Procedure: Right BASCILIC VEIN TRANSPOSITION;  Surgeon: Angelia Mould, MD;  [facility];  Service: Vascular;  Laterality: Right;  . CIRCUMCISION N/A 01/04/2014   Procedure: CIRCUMCISION ADULT (procedure #1);  Surgeon: Marissa Nestle, MD;  Location: [facility];  Service: Urology;  Laterality: N/A;  . COLECTOMY N/A 05/04/2017   Procedure: TOTAL COLECTOMY;  Surgeon: Aviva Signs, MD;  Location: [facility];  Service: General;  Laterality: N/A;  . COLONOSCOPY N/A 04/27/2017   Procedure: COLONOSCOPY;  Surgeon: Daneil Dolin, MD;  [facility];  Service: Endoscopy;  Laterality: N/A;  245  . CYSTOSCOPY W/ RETROGRADES Bilateral 06/29/2015    Procedure: CYSTOSCOPY, DILATION OF URETHRAL STRICTURE WITH BILATERAL RETROGRADE PYELOGRAM,SUPRAPUBIC TUBE CHANGE;  Surgeon: Festus Aloe, MD;  Location: [facility];  Service: Urology;  Laterality: Bilateral;  . CYSTOSCOPY WITH URETHRAL DILATATION N/A 12/29/2013   Procedure: CYSTOSCOPY WITH URETHRAL DILATATION;  Surgeon: Marissa Nestle, MD;  [facility];  Service: Urology;  Laterality: N/A;  . ESOPHAGOGASTRODUODENOSCOPY N/A 04/27/2017   Procedure: ESOPHAGOGASTRODUODENOSCOPY (EGD);  Surgeon: Daneil Dolin, MD;  [facility];  Service: Endoscopy;  Laterality: N/A;  . INSERTION OF DIALYSIS CATHETER Right 11/25/2018   Procedure: INSERTION OF DIALYSIS CATHETER RIGHT INTERNAL JUGULAR;  Surgeon: Waynetta Sandy, MD;  Location: [place];  Service: Vascular;  Laterality: Right;  . IR AV DIALY SHUNT INTRO [place] W/PTA/IMG RIGHT Right 09/07/2018  . IR FLUORO GUIDE CV LINE RIGHT  10/16/2020  . IR REMOVAL TUN CV CATH W/O FL  01/12/2019  . IR THROMBECTOMY AV FISTULA W/THROMBOLYSIS/PTA INC/SHUNT/IMG RIGHT Right 04/26/2018  . IR US GUIDE VASC ACCESS RIGHT  04/26/2018  . IR US GUIDE VASC ACCESS RIGHT  09/07/2018  . IR US GUIDE VASC ACCESS RIGHT  10/16/2020  . IR US GUIDE VASC ACCESS RIGHT  10/16/2020  . ORIF FEMUR FRACTURE Right 11/22/2016   Procedure: OPEN REDUCTION INTERNAL FIXATION (ORIF) DISTAL FEMUR FRACTURE;  Surgeon: Rod Can, MD;  Location: [place];  Service: Orthopedics;  Laterality: Right;  . PATCH ANGIOPLASTY Right 12/10/2017   Procedure: PATCH ANGIOPLASTY;  Surgeon: Angelia Mould, MD;  Location: [facility] OR;  Service: Vascular;  Laterality: Right;  . PERIPHERAL VASCULAR BALLOON ANGIOPLASTY  08/13/2018   Procedure: PERIPHERAL VASCULAR BALLOON ANGIOPLASTY;  Surgeon: Elam Dutch, MD;  Location: [place] [facility];  Service: Cardiovascular;;  right AV fistula   . PERIPHERAL VASCULAR BALLOON ANGIOPLASTY  11/22/2018   Procedure: PERIPHERAL VASCULAR BALLOON  ANGIOPLASTY;  Surgeon: Waynetta Sandy, MD;  Location: [place] [facility];  Service: Cardiovascular;;  rt AV fistula  . PERIPHERAL VASCULAR CATHETERIZATION N/A 10/08/2015   Procedure: A/V Shuntogram;  Surgeon: Angelia Mould, MD;  Location: [place] [facility];  Service: Cardiovascular;  Laterality: N/A;  . THROMBECTOMY W/ EMBOLECTOMY Right 12/10/2017   Procedure: THROMBECTOMY REVISION RIGHT ARM  ARTERIOVENOUS FISTULA;  Surgeon: Angelia Mould, MD;  Location: [place];  Service: Vascular;  Laterality: Right;  . TRANSURETHRAL RESECTION OF PROSTATE N/A 01/04/2014   Procedure: TRANSURETHRAL RESECTION OF THE PROSTATE (TURP) (procedure #2);  Surgeon: Marissa Nestle, MD;  [facility];  Service: Urology;  Laterality: N/A;    Social History   Socioeconomic History  . Marital status: Single    Spouse name: Not on file  . Number of children: Not on file  . Years of education: Not on file  . Highest education level: Not on file  Occupational History  . Occupation: retired   Tobacco Use  . Smoking status: Never Smoker  . Smokeless tobacco: Never  Used  Vaping Use  . Vaping Use: Never used  Substance and Sexual Activity  . Alcohol use: No  . Drug use: No  . Sexual activity: Not Currently  Other Topics Concern  . 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   [facility]    Social Determinants of Health   Financial Resource Strain: Not on file  Food Insecurity: Not on file  Transportation Needs: Not on file  Physical Activity: Not on file  Stress: Not on file  Social Connections: Not on file  Intimate Partner Violence: Not on file   Family History  Problem Relation Age of Onset  . Cancer Mother   . Colon cancer Neg Hx       VITAL SIGNS BP 115/74   Pulse 82   Temp (!) 96.9 F (36.1 C)   Ht 5\' 8"  (1.727 m)   Wt 218 lb 3.2 oz (99 kg)   BMI 33.18 kg/m   Outpatient  Encounter Medications as of 01/23/2021  Medication Sig  . Amino Acids-Protein Hydrolys (FEEDING SUPPLEMENT, PRO-STAT 64,) LIQD Take 30 mLs by mouth 2 (two) times daily between meals.  Marland Kitchen atorvastatin (LIPITOR) 80 MG tablet Take 80 mg by mouth every evening.  . insulin aspart (NOVOLOG FLEXPEN) 100 UNIT/ML FlexPen Inject 20 Units into the skin daily. If cbg>150; subcutaneous  . insulin aspart (NOVOLOG) 100 UNIT/ML FlexPen Inject 15 Units into the skin 2 (two) times daily. Hold insulin on dialysis days if cbg's less than 150 on mon,wed,fri  . Insulin Glargine (BASAGLAR KWIKPEN) 100 UNIT/ML SOPN Inject 45 Units into the skin at bedtime.   Marland Kitchen JANUVIA 25 MG tablet Take 25 mg by mouth daily.   Marland Kitchen latanoprost (XALATAN) 0.005 % ophthalmic solution Place 1 drop into both eyes at bedtime. 9 pm for glaucoma  . levothyroxine (SYNTHROID, LEVOTHROID) 88 MCG tablet Take 88 mcg by mouth daily before breakfast.  . midodrine (PROAMATINE) 10 MG tablet Take 10 mg by mouth. Send med with resident on Mon/Wed/Fri to dialysis. do not give at [facility] give to resident to take with him. send with 5mg  to equal 15mg .  . midodrine (PROAMATINE) 5 MG tablet Take 5 mg by mouth. Send with resident on dialysis days.  Mon, Wed, Fri.  Take along with 10 mg to equal 15 mg  . NON FORMULARY 1200cc fluid restrictions. Dietary to provide 400cc/24hrs Nursing to give 7-3=140cc/24hrs 3-11=560cc/24hrs 11-7=100cc/24 Special Instructions: Document amount of fluids given each shift. Every Shift Day, Evening, Night  . NON FORMULARY Diet Type:  NAS, Cons CHO Diet  . omega-3 acid ethyl esters (LOVAZA) 1 g capsule Take 2 g by mouth at bedtime.  Marland Kitchen omeprazole (PRILOSEC) 40 MG capsule Take 40 mg by mouth daily.   . polyethylene glycol (MIRALAX / GLYCOLAX) packet Take 17 g by mouth daily as needed for mild constipation.   . sevelamer carbonate (RENVELA) 800 MG tablet Take 2,400 mg by mouth 3 (three) times daily with meals.  . tamsulosin (FLOMAX) 0.4  MG CAPS capsule Take 0.4 mg by mouth every evening. Give 30 minutes after a meal  . torsemide (DEMADEX) 10 MG tablet Take 5 tablets (50 mg total) by mouth daily.   No facility-administered encounter medications on file as of 01/23/2021.     SIGNIFICANT DIAGNOSTIC EXAMS   LABS REVIEWED PREVIOUS;  04-09-20: wbc 6.2; hgb 10.7; hct 33.5; mcv 109.1 plt 144;glucose 185; bun 43; creat 10.75;  k+ 4.3; na++ 137; ca 8.7 liver normal albumin 3.7 hgb a1c 7.5 07-04-20: PSA: 2.79  07-19-20: hgb a1c 7.7 09-10-20: wbc 11.8; hgb 12.9; hct 40.4; mcv 108.6 plt 163; glucose 225; bun 30;creat 8.92; k+ 3.8; na++ 138; ca 9.2 10-12-20: chol 76; ldl 64; trig 319; hdl 24; hgb a1c 7.4  10-17-20: wbc 8.2; hgb 11.0; hct 34.3; mcv 108.2 plt 121; glucose 141; bun 51; creat 13.25; k+ 3.9; na++ 138; ca 8.5 mag 2.1   NO NEW LABS.   Review of Systems  Constitutional: Negative for malaise/fatigue.  Respiratory: Negative for cough and shortness of breath.   Cardiovascular: Negative for chest pain, palpitations and leg swelling.  Gastrointestinal: Negative for abdominal pain, constipation and heartburn.  Musculoskeletal: Negative for back pain, joint pain and myalgias.  Skin: Negative.   Neurological: Negative for dizziness.  Psychiatric/Behavioral: The patient is not nervous/anxious.       Physical Exam Constitutional:      General: Zachary [name] is not in acute distress.    Appearance: Zachary [name] is well-developed. Zachary [name] is obese. Zachary [name] is not diaphoretic.  Neck:     Thyroid: No thyromegaly.  Cardiovascular:     Rate and Rhythm: Normal rate and regular rhythm.     Pulses: Normal pulses.     Heart sounds: Normal heart sounds.  Pulmonary:     Effort: Pulmonary effort is normal. No respiratory distress.     Breath sounds: Normal breath sounds.  Abdominal:     General: Bowel sounds are normal. There is no distension.     Palpations: Abdomen is soft.     Tenderness: There is no abdominal tenderness.  Genitourinary:    Comments: Turp hx   Musculoskeletal:        General: Normal range of motion.     Cervical back: Neck supple.     Comments:  Is able to move all extremities History of right femur ORIF   Lymphadenopathy:     Cervical: No cervical adenopathy.  Skin:    General: Skin is warm and dry.     Comments:  Right upper extremity a/v fistula + thrill /bruit   Neurological:     Mental Status: Zachary [name] is alert. Mental status is at baseline.  Psychiatric:        Mood and Affect: Mood normal.     ASSESSMENT/ PLAN:  TODAY  1. GERD without esophagitis: is stable will continue prilosec 40 mg daily   2. History of colon cancer: is stable is status post colectomy 05-04-17.   3. Dyslipidemia associated with type 2 diabetes mellitus: LDL 64 trig 319 will continue lipitor 80 mg daily and lovaza 2 gm daily    PREVIOUS   4. End stage renal disease on hemodialysis due to type 2 diabetes mellitus:/hemodialysis dependent: is stable will continue dialysis three days per week; is followed by nephrology; will continue midodrine 15 mg with dialysis  1200 cc fluid restriction renvela 1600 mg with meals and 800 mg with snacks.   5. Other specified hypothyroidism: is stable tsh 3.202 will continue synthroid 88 mcg daily   6. 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 Major depression single episode moderate: is presently stable is off medications will monitor   8. Type 2 diabetes mellitus with hypertension and end stage renal disease: is stable hgb a1c 7.2 will continue januvia 25 mg daily basaglar 40 units nightly novolog 10 units with breakfast and lunch 20 units with supper.  9. Bilateral lower extremity edema: is stable will continue demedex 50 mg daily   10. Orthostatic hypotension stable b/p 115/74 will continue midodrine 15 mg with dialysis   11. Aortic atherosclerosis (ct 2019) is stable will monitor    Will check hgb a1c   Ok Edwards NP [facility]   Contact [phone] Monday through Friday 8am- 5pm  After hours call [phone]

## 2021-01-24 DIAGNOSIS — N186 End stage renal disease: Secondary | ICD-10-CM | POA: Diagnosis not present

## 2021-01-24 DIAGNOSIS — Z992 Dependence on renal dialysis: Secondary | ICD-10-CM | POA: Diagnosis not present

## 2021-01-25 DIAGNOSIS — N2581 Secondary hyperparathyroidism of renal origin: Secondary | ICD-10-CM | POA: Diagnosis not present

## 2021-01-25 DIAGNOSIS — Z992 Dependence on renal dialysis: Secondary | ICD-10-CM | POA: Diagnosis not present

## 2021-01-25 DIAGNOSIS — Z85038 Personal history of other malignant neoplasm of large intestine: Secondary | ICD-10-CM | POA: Insufficient documentation

## 2021-01-25 DIAGNOSIS — D509 Iron deficiency anemia, unspecified: Secondary | ICD-10-CM | POA: Diagnosis not present

## 2021-01-25 DIAGNOSIS — N186 End stage renal disease: Secondary | ICD-10-CM | POA: Diagnosis not present

## 2021-01-25 DIAGNOSIS — D631 Anemia in chronic kidney disease: Secondary | ICD-10-CM | POA: Diagnosis not present

## 2021-01-28 ENCOUNTER — Encounter (HOSPITAL_COMMUNITY)
Admission: RE | Admit: 2021-01-28 | Discharge: 2021-01-28 | Disposition: A | Discharge status: Home / Self Care | Payer: Medicare Other | Source: Skilled Nursing Facility | Attending: Adult Health | Admitting: Adult Health

## 2021-01-28 DIAGNOSIS — D509 Iron deficiency anemia, unspecified: Secondary | ICD-10-CM | POA: Diagnosis not present

## 2021-01-28 DIAGNOSIS — Z992 Dependence on renal dialysis: Secondary | ICD-10-CM | POA: Diagnosis not present

## 2021-01-28 DIAGNOSIS — E1129 Type 2 diabetes mellitus with other diabetic kidney complication: Secondary | ICD-10-CM | POA: Diagnosis not present

## 2021-01-28 DIAGNOSIS — N186 End stage renal disease: Secondary | ICD-10-CM | POA: Diagnosis not present

## 2021-01-28 DIAGNOSIS — D631 Anemia in chronic kidney disease: Secondary | ICD-10-CM | POA: Diagnosis not present

## 2021-01-28 DIAGNOSIS — N2581 Secondary hyperparathyroidism of renal origin: Secondary | ICD-10-CM | POA: Diagnosis not present

## 2021-01-28 LAB — HEMOGLOBIN A1C
Hgb A1c MFr Bld: 6.5 % — ABNORMAL HIGH (ref 4.8–5.6)
Mean Plasma Glucose: 139.85 mg/dL

## 2021-01-30 DIAGNOSIS — N186 End stage renal disease: Secondary | ICD-10-CM | POA: Diagnosis not present

## 2021-01-30 DIAGNOSIS — Z992 Dependence on renal dialysis: Secondary | ICD-10-CM | POA: Diagnosis not present

## 2021-01-30 DIAGNOSIS — N2581 Secondary hyperparathyroidism of renal origin: Secondary | ICD-10-CM | POA: Diagnosis not present

## 2021-01-30 DIAGNOSIS — D631 Anemia in chronic kidney disease: Secondary | ICD-10-CM | POA: Diagnosis not present

## 2021-01-30 DIAGNOSIS — D509 Iron deficiency anemia, unspecified: Secondary | ICD-10-CM | POA: Diagnosis not present

## 2021-02-01 DIAGNOSIS — D631 Anemia in chronic kidney disease: Secondary | ICD-10-CM | POA: Diagnosis not present

## 2021-02-01 DIAGNOSIS — Z992 Dependence on renal dialysis: Secondary | ICD-10-CM | POA: Diagnosis not present

## 2021-02-01 DIAGNOSIS — N2581 Secondary hyperparathyroidism of renal origin: Secondary | ICD-10-CM | POA: Diagnosis not present

## 2021-02-01 DIAGNOSIS — D509 Iron deficiency anemia, unspecified: Secondary | ICD-10-CM | POA: Diagnosis not present

## 2021-02-01 DIAGNOSIS — N186 End stage renal disease: Secondary | ICD-10-CM | POA: Diagnosis not present

## 2021-02-04 DIAGNOSIS — D631 Anemia in chronic kidney disease: Secondary | ICD-10-CM | POA: Diagnosis not present

## 2021-02-04 DIAGNOSIS — Z992 Dependence on renal dialysis: Secondary | ICD-10-CM | POA: Diagnosis not present

## 2021-02-04 DIAGNOSIS — D509 Iron deficiency anemia, unspecified: Secondary | ICD-10-CM | POA: Diagnosis not present

## 2021-02-04 DIAGNOSIS — N186 End stage renal disease: Secondary | ICD-10-CM | POA: Diagnosis not present

## 2021-02-04 DIAGNOSIS — N2581 Secondary hyperparathyroidism of renal origin: Secondary | ICD-10-CM | POA: Diagnosis not present

## 2021-02-06 DIAGNOSIS — Z23 Encounter for immunization: Secondary | ICD-10-CM | POA: Diagnosis not present

## 2021-02-06 DIAGNOSIS — E119 Type 2 diabetes mellitus without complications: Secondary | ICD-10-CM | POA: Diagnosis not present

## 2021-02-06 DIAGNOSIS — N186 End stage renal disease: Secondary | ICD-10-CM | POA: Diagnosis not present

## 2021-02-06 DIAGNOSIS — Z992 Dependence on renal dialysis: Secondary | ICD-10-CM | POA: Diagnosis not present

## 2021-02-06 DIAGNOSIS — N2581 Secondary hyperparathyroidism of renal origin: Secondary | ICD-10-CM | POA: Diagnosis not present

## 2021-02-06 DIAGNOSIS — D509 Iron deficiency anemia, unspecified: Secondary | ICD-10-CM | POA: Diagnosis not present

## 2021-02-06 DIAGNOSIS — D631 Anemia in chronic kidney disease: Secondary | ICD-10-CM | POA: Diagnosis not present

## 2021-02-08 DIAGNOSIS — D509 Iron deficiency anemia, unspecified: Secondary | ICD-10-CM | POA: Diagnosis not present

## 2021-02-08 DIAGNOSIS — N186 End stage renal disease: Secondary | ICD-10-CM | POA: Diagnosis not present

## 2021-02-08 DIAGNOSIS — Z992 Dependence on renal dialysis: Secondary | ICD-10-CM | POA: Diagnosis not present

## 2021-02-08 DIAGNOSIS — D631 Anemia in chronic kidney disease: Secondary | ICD-10-CM | POA: Diagnosis not present

## 2021-02-08 DIAGNOSIS — N2581 Secondary hyperparathyroidism of renal origin: Secondary | ICD-10-CM | POA: Diagnosis not present

## 2021-02-11 DIAGNOSIS — D631 Anemia in chronic kidney disease: Secondary | ICD-10-CM | POA: Diagnosis not present

## 2021-02-11 DIAGNOSIS — N2581 Secondary hyperparathyroidism of renal origin: Secondary | ICD-10-CM | POA: Diagnosis not present

## 2021-02-11 DIAGNOSIS — D509 Iron deficiency anemia, unspecified: Secondary | ICD-10-CM | POA: Diagnosis not present

## 2021-02-11 DIAGNOSIS — N186 End stage renal disease: Secondary | ICD-10-CM | POA: Diagnosis not present

## 2021-02-11 DIAGNOSIS — Z992 Dependence on renal dialysis: Secondary | ICD-10-CM | POA: Diagnosis not present

## 2021-02-13 DIAGNOSIS — Z992 Dependence on renal dialysis: Secondary | ICD-10-CM | POA: Diagnosis not present

## 2021-02-13 DIAGNOSIS — N2581 Secondary hyperparathyroidism of renal origin: Secondary | ICD-10-CM | POA: Diagnosis not present

## 2021-02-13 DIAGNOSIS — D631 Anemia in chronic kidney disease: Secondary | ICD-10-CM | POA: Diagnosis not present

## 2021-02-13 DIAGNOSIS — N186 End stage renal disease: Secondary | ICD-10-CM | POA: Diagnosis not present

## 2021-02-13 DIAGNOSIS — D509 Iron deficiency anemia, unspecified: Secondary | ICD-10-CM | POA: Diagnosis not present

## 2021-02-15 DIAGNOSIS — Z992 Dependence on renal dialysis: Secondary | ICD-10-CM | POA: Diagnosis not present

## 2021-02-15 DIAGNOSIS — D509 Iron deficiency anemia, unspecified: Secondary | ICD-10-CM | POA: Diagnosis not present

## 2021-02-15 DIAGNOSIS — N186 End stage renal disease: Secondary | ICD-10-CM | POA: Diagnosis not present

## 2021-02-15 DIAGNOSIS — D631 Anemia in chronic kidney disease: Secondary | ICD-10-CM | POA: Diagnosis not present

## 2021-02-15 DIAGNOSIS — N2581 Secondary hyperparathyroidism of renal origin: Secondary | ICD-10-CM | POA: Diagnosis not present

## 2021-02-18 DIAGNOSIS — N186 End stage renal disease: Secondary | ICD-10-CM | POA: Diagnosis not present

## 2021-02-18 DIAGNOSIS — D509 Iron deficiency anemia, unspecified: Secondary | ICD-10-CM | POA: Diagnosis not present

## 2021-02-18 DIAGNOSIS — N2581 Secondary hyperparathyroidism of renal origin: Secondary | ICD-10-CM | POA: Diagnosis not present

## 2021-02-18 DIAGNOSIS — Z992 Dependence on renal dialysis: Secondary | ICD-10-CM | POA: Diagnosis not present

## 2021-02-18 DIAGNOSIS — D631 Anemia in chronic kidney disease: Secondary | ICD-10-CM | POA: Diagnosis not present

## 2021-02-19 ENCOUNTER — Non-Acute Institutional Stay (SKILLED_NURSING_FACILITY): Payer: Medicare Other | Admitting: Adult Health

## 2021-02-19 ENCOUNTER — Encounter: Payer: Self-pay | Admitting: Adult Health

## 2021-02-19 DIAGNOSIS — D631 Anemia in chronic kidney disease: Secondary | ICD-10-CM | POA: Diagnosis not present

## 2021-02-19 DIAGNOSIS — E034 Atrophy of thyroid (acquired): Secondary | ICD-10-CM

## 2021-02-19 DIAGNOSIS — Z992 Dependence on renal dialysis: Secondary | ICD-10-CM

## 2021-02-19 DIAGNOSIS — N186 End stage renal disease: Secondary | ICD-10-CM

## 2021-02-19 DIAGNOSIS — E1122 Type 2 diabetes mellitus with diabetic chronic kidney disease: Secondary | ICD-10-CM | POA: Diagnosis not present

## 2021-02-20 DIAGNOSIS — Z992 Dependence on renal dialysis: Secondary | ICD-10-CM | POA: Diagnosis not present

## 2021-02-20 DIAGNOSIS — N186 End stage renal disease: Secondary | ICD-10-CM | POA: Diagnosis not present

## 2021-02-20 DIAGNOSIS — D509 Iron deficiency anemia, unspecified: Secondary | ICD-10-CM | POA: Diagnosis not present

## 2021-02-20 DIAGNOSIS — N2581 Secondary hyperparathyroidism of renal origin: Secondary | ICD-10-CM | POA: Diagnosis not present

## 2021-02-20 DIAGNOSIS — D631 Anemia in chronic kidney disease: Secondary | ICD-10-CM | POA: Diagnosis not present

## 2021-02-20 NOTE — Progress Notes (Signed)
Location:  [facility] Room Number: 112/D Place of Service:  SNF (31)   CODE STATUS: Full Code  Allergies  Allergen Reactions  . No Known Allergies     Chief Complaint  Patient presents with  . 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  There are no reports of changes in appetite; no reports of uncontrolled pain; no reports anxiety.   Past Medical History:  Diagnosis Date  . Abnormal CT scan, kidney 10/06/2011  . Acute pyelonephritis 10/07/2011  . Anemia    normocytic  . Anxiety    mental retardation  . Bladder wall thickening 10/06/2011  . BPH (benign prostatic hypertrophy)   . Diabetes mellitus   . Dialysis patient [facility])    Tuesday, Thursday and Saturday,   . DVT of leg (deep venous thrombosis) ([place]) 12/25/2016  . Edema     history of lower extremity edema  . GERD (gastroesophageal reflux disease)   . Heme positive stool   . Hydronephrosis   . Hyperkalemia   . Hyperlipidemia   . Hypernatremia   . Hypertension   . Hypothyroidism   . Impaired speech   . 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 MR (mental retardation)   . Muscle weakness   . Obstructive uropathy   . 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 Poor historian poor historian  . Protein calorie malnutrition ([place])   . Pyelonephritis   . Renal failure (ARF), acute on chronic (HCC)   . Renal insufficiency    chronic history  . Sepsis ([place])   . Smoking   . Uremia   . Urinary retention   . UTI  (lower urinary tract infection) 10/06/2011    Past Surgical History:  Procedure Laterality Date  . A/V FISTULAGRAM N/A 08/13/2018   Procedure: A/V FISTULAGRAM - Right Upper;  Surgeon: Elam Dutch, MD;  Location: [place] [facility];  Service: Cardiovascular;  Laterality: N/A;  . A/V FISTULAGRAM N/A 11/22/2018   Procedure: A/V FISTULAGRAM - Right Upper;  Surgeon: Waynetta Sandy, MD;  Location: [place] [facility];  Service: Cardiovascular;  Laterality: N/A;  . AV FISTULA PLACEMENT Left 07/06/2015   Procedure:  INSERTION LEFT ARM ARTERIOVENOUS GORTEX GRAFT;  Surgeon: Angelia Mould, MD;  Location: [place];  Service: Vascular;  Laterality: Left;  . AV FISTULA PLACEMENT Right 02/26/2016   Procedure: ARTERIOVENOUS (AV) FISTULA CREATION ;  Surgeon: Angelia Mould, MD;  Location: [place];  Service: Vascular;  Laterality: Right;  . AV FISTULA PLACEMENT Right 11/25/2018   Procedure: INSERTION OF ARTERIOVENOUS (AV) ARTEGRAFT RIGHT UPPER ARM;  Surgeon: Waynetta Sandy, MD;  Location: [place];  Service: Vascular;  Laterality: Right;  . [place] REMOVAL Left 10/09/2015   Procedure: REMOVAL OF ARTERIOVENOUS GORETEX GRAFT ([place]) Evacuation of Lymphocele, Vein Patch angioplasty of brachial artery.;  Surgeon: Angelia Mould, MD;  Location: [place];  Service: Vascular;  Laterality: Left;  . BASCILIC VEIN TRANSPOSITION Right 02/26/2016  Procedure: Right BASCILIC VEIN TRANSPOSITION;  Surgeon: Angelia Mould, MD;  Location: [place];  Service: Vascular;  Laterality: Right;  . CIRCUMCISION N/A 01/04/2014   Procedure: CIRCUMCISION ADULT (procedure #1);  Surgeon: Marissa Nestle, MD;  Location: [facility];  Service: Urology;  Laterality: N/A;  . COLECTOMY N/A 05/04/2017   Procedure: TOTAL COLECTOMY;  Surgeon: Aviva Signs, MD;  Location: [facility];  Service: General;  Laterality: N/A;  . COLONOSCOPY N/A 04/27/2017   Procedure: COLONOSCOPY;  Surgeon: Daneil Dolin, MD;  [facility];  Service: Endoscopy;  Laterality: N/A;  245  . CYSTOSCOPY W/ RETROGRADES Bilateral 06/29/2015   Procedure: CYSTOSCOPY, DILATION OF URETHRAL STRICTURE WITH BILATERAL RETROGRADE PYELOGRAM,SUPRAPUBIC TUBE CHANGE;  Surgeon: Festus Aloe, MD;  Location: [facility];  Service: Urology;  Laterality: Bilateral;  . CYSTOSCOPY WITH URETHRAL DILATATION N/A 12/29/2013   Procedure: CYSTOSCOPY WITH URETHRAL DILATATION;  Surgeon: Marissa Nestle, MD;  [facility];  Service: Urology;  Laterality: N/A;  . ESOPHAGOGASTRODUODENOSCOPY N/A 04/27/2017   Procedure: ESOPHAGOGASTRODUODENOSCOPY (EGD);  Surgeon: Daneil Dolin, MD;  [facility];  Service: Endoscopy;  Laterality: N/A;  . INSERTION OF DIALYSIS CATHETER Right 11/25/2018   Procedure: INSERTION OF DIALYSIS CATHETER RIGHT INTERNAL JUGULAR;  Surgeon: Waynetta Sandy, MD;  Location: [place];  Service: Vascular;  Laterality: Right;  . IR AV DIALY SHUNT INTRO [place] W/PTA/IMG RIGHT Right 09/07/2018  . IR FLUORO GUIDE CV LINE RIGHT  10/16/2020  . IR REMOVAL TUN CV CATH W/O FL  01/12/2019  . IR THROMBECTOMY AV FISTULA W/THROMBOLYSIS/PTA INC/SHUNT/IMG RIGHT Right 04/26/2018  . IR US GUIDE VASC ACCESS RIGHT  04/26/2018  . IR US GUIDE VASC ACCESS RIGHT  09/07/2018  . IR US GUIDE VASC ACCESS RIGHT  10/16/2020  . IR US GUIDE VASC ACCESS RIGHT  10/16/2020  . ORIF FEMUR FRACTURE Right 11/22/2016   Procedure: OPEN REDUCTION INTERNAL FIXATION (ORIF) DISTAL FEMUR FRACTURE;  Surgeon: Rod Can, MD;  Location: [place];  Service: Orthopedics;  Laterality: Right;  . PATCH ANGIOPLASTY Right 12/10/2017   Procedure: PATCH ANGIOPLASTY;  Surgeon: Angelia Mould, MD;  Location: [facility];  Service: Vascular;  Laterality: Right;  . PERIPHERAL VASCULAR BALLOON ANGIOPLASTY  08/13/2018   Procedure: PERIPHERAL VASCULAR BALLOON ANGIOPLASTY;  Surgeon: Elam Dutch, MD;  Location: [place] [facility];  Service: Cardiovascular;;  right AV  fistula   . PERIPHERAL VASCULAR BALLOON ANGIOPLASTY  11/22/2018   Procedure: PERIPHERAL VASCULAR BALLOON ANGIOPLASTY;  Surgeon: Waynetta Sandy, MD;  Location: [place] [facility];  Service: Cardiovascular;;  rt AV fistula  . PERIPHERAL VASCULAR CATHETERIZATION N/A 10/08/2015   Procedure: A/V Shuntogram;  Surgeon: Angelia Mould, MD;  Location: [place] [facility];  Service: Cardiovascular;  Laterality: N/A;  . THROMBECTOMY W/ EMBOLECTOMY Right 12/10/2017   Procedure: THROMBECTOMY REVISION RIGHT ARM  ARTERIOVENOUS FISTULA;  Surgeon: Angelia Mould, MD;  Location: [place];  Service: Vascular;  Laterality: Right;  . TRANSURETHRAL RESECTION OF PROSTATE N/A 01/04/2014   Procedure: TRANSURETHRAL RESECTION OF THE PROSTATE (TURP) (procedure #2);  Surgeon: Marissa Nestle, MD;  [facility];  Service: Urology;  Laterality: N/A;    Social History   Socioeconomic History  . Marital status: Single    Spouse name: Not on file  . Number of children: Not on file  . Years of education: Not on file  . Highest education level: Not on file  Occupational History  . Occupation: retired   Tobacco  Use  . Smoking status: Never Smoker  . Smokeless tobacco: Never Used  Vaping Use  . Vaping Use: Never used  Substance and Sexual Activity  . Alcohol use: No  . Drug use: No  . Sexual activity: Not Currently  Other Topics Concern  . 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   Long term resident of [facility]    Social Determinants of Health   Financial Resource Strain: Not on file  Food Insecurity: Not on file  Transportation Needs: Not on file  Physical Activity: Not on file  Stress: Not on file  Social Connections: Not on file  Intimate Partner Violence: Not on file   Family History  Problem Relation Age of Onset  . Cancer Mother   . Colon cancer Neg Hx       VITAL SIGNS BP 136/70   Pulse 88    Temp 98.8 F (37.1 C)   Ht 5\' 8"  (1.727 m)   Wt 218 lb 6.4 oz (99.1 kg)   BMI 33.21 kg/m   Outpatient Encounter Medications as of 02/19/2021  Medication Sig  . atorvastatin (LIPITOR) 80 MG tablet Take 80 mg by mouth every evening.  . insulin aspart (NOVOLOG FLEXPEN) 100 UNIT/ML FlexPen Inject 20 Units into the skin daily. If cbg>150; subcutaneous  . insulin aspart (NOVOLOG) 100 UNIT/ML FlexPen Inject 15 Units into the skin 2 (two) times daily. Hold insulin on dialysis days if cbg's less than 150 on mon,wed,fri  . Insulin Glargine (BASAGLAR KWIKPEN) 100 UNIT/ML SOPN Inject 45 Units into the skin at bedtime.   Marland Kitchen JANUVIA 25 MG tablet Take 25 mg by mouth daily.   Marland Kitchen latanoprost (XALATAN) 0.005 % ophthalmic solution Place 1 drop into both eyes at bedtime. 9 pm for glaucoma  . levothyroxine (SYNTHROID, LEVOTHROID) 88 MCG tablet Take 88 mcg by mouth daily before breakfast.  . midodrine (PROAMATINE) 10 MG tablet Take 10 mg by mouth. Send med with resident on Mon/Wed/Fri to dialysis. do not give [facility] give to resident to take with him. send with 5mg  to equal 15mg .  . midodrine (PROAMATINE) 5 MG tablet Take 5 mg by mouth. Send with resident on dialysis days.  Mon, Wed, Fri.  Take along with 10 mg to equal 15 mg  . NON FORMULARY 1200cc fluid restrictions. Dietary to provide 400cc/24hrs Nursing to give 7-3=140cc/24hrs 3-11=560cc/24hrs 11-7=100cc/24 Special Instructions: Document amount of fluids given each shift. Every Shift Day, Evening, Night  . NON FORMULARY Diet Type:  NAS, Cons CHO Diet  . omega-3 acid ethyl esters (LOVAZA) 1 g capsule Take 2 g by mouth at bedtime.  Marland Kitchen omeprazole (PRILOSEC) 40 MG capsule Take 40 mg by mouth daily.   . polyethylene glycol (MIRALAX / GLYCOLAX) packet Take 17 g by mouth daily as needed for mild constipation.   . sevelamer carbonate (RENVELA) 800 MG tablet Take 2,400 mg by mouth 3 (three) times daily with meals.  . tamsulosin (FLOMAX) 0.4 MG CAPS capsule Take  0.4 mg by mouth every evening. Give 30 minutes after a meal  . torsemide (DEMADEX) 10 MG tablet Take 5 tablets (50 mg total) by mouth daily.  . [DISCONTINUED] Amino Acids-Protein Hydrolys (FEEDING SUPPLEMENT, PRO-STAT 64,) LIQD Take 30 mLs by mouth 2 (two) times daily between meals.   No facility-administered encounter medications on file as of 02/19/2021.     SIGNIFICANT DIAGNOSTIC EXAMS  LABS REVIEWED PREVIOUS;  04-09-20: wbc 6.2; hgb 10.7;  hct 33.5; mcv 109.1 plt 144;glucose 185; bun 43; creat 10.75; k+ 4.3; na++ 137; ca 8.7 liver normal albumin 3.7 hgb a1c 7.5 07-04-20: PSA: 2.79  07-19-20: hgb a1c 7.7 09-10-20: wbc 11.8; hgb 12.9; hct 40.4; mcv 108.6 plt 163; glucose 225; bun 30;creat 8.92; k+ 3.8; na++ 138; ca 9.2 10-12-20: chol 76; ldl 64; trig 319; hdl 24; hgb a1c 7.4  10-17-20: wbc 8.2; hgb 11.0; hct 34.3; mcv 108.2 plt 121; glucose 141; bun 51; creat 13.25; k+ 3.9; na++ 138; ca 8.5 mag 2.1   TODAY  01-28-21: hgb a1c 6.5   Review of Systems  Constitutional: Negative for malaise/fatigue.  Respiratory: Negative for cough and shortness of breath.   Cardiovascular: Negative for chest pain, palpitations and leg swelling.  Gastrointestinal: Negative for abdominal pain, constipation and heartburn.  Musculoskeletal: Negative for back pain, joint pain and myalgias.  Skin: Negative.   Neurological: Negative for dizziness.  Psychiatric/Behavioral: The patient is not nervous/anxious.    Physical Exam Constitutional:      General: He is not in acute distress.    Appearance: He is well-developed. He is obese. He is not diaphoretic.  Neck:     Thyroid: No thyromegaly.  Cardiovascular:     Rate and Rhythm: Normal rate and regular rhythm.     Pulses: Normal pulses.     Heart sounds: Normal heart sounds.  Pulmonary:     Effort: Pulmonary effort is normal. No respiratory distress.     Breath sounds: Normal breath sounds.  Abdominal:     General: Bowel sounds are normal. There is no  distension.     Palpations: Abdomen is soft.     Tenderness: There is no abdominal tenderness.  Genitourinary:    Comments: Hx turp  Musculoskeletal:     Cervical back: Neck supple.     Right lower leg: No edema.     Left lower leg: No edema.     Comments:  Is able to move all extremities History of right femur ORIF  Lymphadenopathy:     Cervical: No cervical adenopathy.  Skin:    General: Skin is warm and dry.  Neurological:     Mental Status: He is alert. Mental status is at baseline.  Psychiatric:        Mood and Affect: Mood normal.         ASSESSMENT/ PLAN:  TODAY  1. 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: is stable will continue dialysis three days per week. Is followed by nephrology. Will continue midodrine 15 mg with dialysis 1200 cc fluid restriction; renvela 1600 mg with meals and 800 mg with snacks  2. Other specified hypothyroidism: is stable tsh 3.202 will continue synthroid 88 mcg daily   3. Anemia of chronic renal disease on chronic dialysis is stable hgb 11.0 will monitor   PREVIOUS   4. Major depression single episode moderate: is presently stable is off medications will monitor   5. Type 2 diabetes mellitus with hypertension and end stage renal disease: is stable hgb a1c 7.2 will continue januvia 25 mg daily basaglar 40 units nightly novolog 10 units with breakfast and lunch 20 units with supper.   6. Bilateral lower extremity edema: is stable will continue demedex 50 mg daily   7. Orthostatic hypotension stable b/p 115/74 will continue midodrine 15 mg with dialysis   8. Aortic atherosclerosis (ct 2019) is stable will monitor   9. GERD without esophagitis: is stable will continue  prilosec 40 mg daily   10. History of colon cancer: is stable is status post colectomy 05-04-17.   11. Dyslipidemia associated with type 2 diabetes mellitus: LDL 64 trig 319 will continue lipitor 80 mg daily and lovaza  2 gm daily     Ok Edwards NP [facility]  Contact [phone] Monday through Friday 8am- 5pm  After hours call [phone]

## 2021-02-22 DIAGNOSIS — F70 Mild intellectual disabilities: Secondary | ICD-10-CM | POA: Diagnosis not present

## 2021-02-22 DIAGNOSIS — D631 Anemia in chronic kidney disease: Secondary | ICD-10-CM | POA: Diagnosis not present

## 2021-02-22 DIAGNOSIS — N186 End stage renal disease: Secondary | ICD-10-CM | POA: Diagnosis not present

## 2021-02-22 DIAGNOSIS — D509 Iron deficiency anemia, unspecified: Secondary | ICD-10-CM | POA: Diagnosis not present

## 2021-02-22 DIAGNOSIS — N2581 Secondary hyperparathyroidism of renal origin: Secondary | ICD-10-CM | POA: Diagnosis not present

## 2021-02-22 DIAGNOSIS — F4322 Adjustment disorder with anxiety: Secondary | ICD-10-CM | POA: Diagnosis not present

## 2021-02-22 DIAGNOSIS — Z992 Dependence on renal dialysis: Secondary | ICD-10-CM | POA: Diagnosis not present

## 2021-02-23 DIAGNOSIS — N186 End stage renal disease: Secondary | ICD-10-CM | POA: Diagnosis not present

## 2021-02-23 DIAGNOSIS — Z992 Dependence on renal dialysis: Secondary | ICD-10-CM | POA: Diagnosis not present

## 2021-02-25 DIAGNOSIS — D509 Iron deficiency anemia, unspecified: Secondary | ICD-10-CM | POA: Diagnosis not present

## 2021-02-25 DIAGNOSIS — D631 Anemia in chronic kidney disease: Secondary | ICD-10-CM | POA: Diagnosis not present

## 2021-02-25 DIAGNOSIS — Z992 Dependence on renal dialysis: Secondary | ICD-10-CM | POA: Diagnosis not present

## 2021-02-25 DIAGNOSIS — N2581 Secondary hyperparathyroidism of renal origin: Secondary | ICD-10-CM | POA: Diagnosis not present

## 2021-02-25 DIAGNOSIS — N186 End stage renal disease: Secondary | ICD-10-CM | POA: Diagnosis not present

## 2021-02-27 DIAGNOSIS — D509 Iron deficiency anemia, unspecified: Secondary | ICD-10-CM | POA: Diagnosis not present

## 2021-02-27 DIAGNOSIS — N186 End stage renal disease: Secondary | ICD-10-CM | POA: Diagnosis not present

## 2021-02-27 DIAGNOSIS — D631 Anemia in chronic kidney disease: Secondary | ICD-10-CM | POA: Diagnosis not present

## 2021-02-27 DIAGNOSIS — N2581 Secondary hyperparathyroidism of renal origin: Secondary | ICD-10-CM | POA: Diagnosis not present

## 2021-02-27 DIAGNOSIS — Z992 Dependence on renal dialysis: Secondary | ICD-10-CM | POA: Diagnosis not present

## 2021-03-04 DIAGNOSIS — N2581 Secondary hyperparathyroidism of renal origin: Secondary | ICD-10-CM | POA: Diagnosis not present

## 2021-03-04 DIAGNOSIS — D631 Anemia in chronic kidney disease: Secondary | ICD-10-CM | POA: Diagnosis not present

## 2021-03-04 DIAGNOSIS — Z992 Dependence on renal dialysis: Secondary | ICD-10-CM | POA: Diagnosis not present

## 2021-03-04 DIAGNOSIS — D509 Iron deficiency anemia, unspecified: Secondary | ICD-10-CM | POA: Diagnosis not present

## 2021-03-04 DIAGNOSIS — N186 End stage renal disease: Secondary | ICD-10-CM | POA: Diagnosis not present

## 2021-03-06 DIAGNOSIS — N2581 Secondary hyperparathyroidism of renal origin: Secondary | ICD-10-CM | POA: Diagnosis not present

## 2021-03-06 DIAGNOSIS — Z992 Dependence on renal dialysis: Secondary | ICD-10-CM | POA: Diagnosis not present

## 2021-03-06 DIAGNOSIS — D509 Iron deficiency anemia, unspecified: Secondary | ICD-10-CM | POA: Diagnosis not present

## 2021-03-06 DIAGNOSIS — D631 Anemia in chronic kidney disease: Secondary | ICD-10-CM | POA: Diagnosis not present

## 2021-03-06 DIAGNOSIS — N186 End stage renal disease: Secondary | ICD-10-CM | POA: Diagnosis not present

## 2021-03-07 ENCOUNTER — Other Ambulatory Visit (HOSPITAL_COMMUNITY)
Admission: RE | Admit: 2021-03-07 | Discharge: 2021-03-07 | Disposition: A | Discharge status: Home / Self Care | Payer: Medicare Other | Source: Skilled Nursing Facility | Attending: Adult Health | Admitting: Adult Health

## 2021-03-07 ENCOUNTER — Encounter: Payer: Self-pay | Admitting: Adult Health

## 2021-03-07 ENCOUNTER — Non-Acute Institutional Stay (SKILLED_NURSING_FACILITY): Payer: Medicare Other | Admitting: Adult Health

## 2021-03-07 DIAGNOSIS — E039 Hypothyroidism, unspecified: Secondary | ICD-10-CM | POA: Diagnosis not present

## 2021-03-07 DIAGNOSIS — E034 Atrophy of thyroid (acquired): Secondary | ICD-10-CM | POA: Diagnosis not present

## 2021-03-07 LAB — TSH: TSH: 5.008 u[IU]/mL — ABNORMAL HIGH (ref 0.350–4.500)

## 2021-03-07 NOTE — Progress Notes (Signed)
Location:  [facility] Room Number: 112-D Place of Service:  SNF (31)   CODE STATUS: Full Code  Allergies  Allergen Reactions  . No Known Allergies     Chief Complaint  Patient presents with  . Acute Visit    Lab follow up    HPI:  He is presently taking synthroid 88 mcg daily. His tsh has returned at 5.008. there are no reports of excessive fatigue. No reports of lower extremity edema; no changes in appetite    Past Medical History:  Diagnosis Date  . Abnormal CT scan, kidney 10/06/2011  . Acute pyelonephritis 10/07/2011  . Anemia    normocytic  . Anxiety    mental retardation  . Bladder wall thickening 10/06/2011  . BPH (benign prostatic hypertrophy)   . Diabetes mellitus   . Dialysis patient [facility])    Tuesday, Thursday and Saturday,   . DVT of leg (deep venous thrombosis) ([place]) 12/25/2016  . Edema     history of lower extremity edema  . GERD (gastroesophageal reflux disease)   . Heme positive stool   . Hydronephrosis   . Hyperkalemia   . Hyperlipidemia   . Hypernatremia   . Hypertension   . Hypothyroidism   . Impaired speech   . 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 MR (mental retardation)   . Muscle weakness   . Obstructive uropathy   . 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 Poor historian poor historian  . Protein calorie malnutrition ([place])   . Pyelonephritis   . Renal failure (ARF), acute on chronic (HCC)   . Renal insufficiency    chronic history  . Sepsis ([place])   . Smoking   . Uremia   . Urinary retention   . UTI (lower urinary tract infection) 10/06/2011    Past Surgical History:  Procedure Laterality Date  . A/V FISTULAGRAM N/A 08/13/2018   Procedure: A/V FISTULAGRAM - Right Upper;  Surgeon: Elam Dutch, MD;  Location: [facility];  Service: Cardiovascular;  Laterality: N/A;  . A/V FISTULAGRAM N/A 11/22/2018   Procedure: A/V FISTULAGRAM - Right Upper;  Surgeon: Waynetta Sandy, MD;  Location:  [facility];  Service: Cardiovascular;  Laterality: N/A;  . AV FISTULA PLACEMENT Left 07/06/2015   Procedure:  INSERTION LEFT ARM ARTERIOVENOUS GORTEX GRAFT;  Surgeon: Angelia Mould, MD;  Location: [facility];  Service: Vascular;  Laterality: Left;  . AV FISTULA PLACEMENT Right 02/26/2016   Procedure: ARTERIOVENOUS (AV) FISTULA CREATION ;  Surgeon: Angelia Mould, MD;  Location: [facility];  Service: Vascular;  Laterality: Right;  . AV FISTULA PLACEMENT Right 11/25/2018   Procedure: INSERTION OF ARTERIOVENOUS (AV) ARTEGRAFT RIGHT UPPER ARM;  Surgeon: Waynetta Sandy, MD;  Location: [facility];  Service: Vascular;  Laterality: Right;  . [place] REMOVAL Left 10/09/2015   Procedure: REMOVAL OF ARTERIOVENOUS GORETEX GRAFT ([place]) Evacuation of Lymphocele, Vein Patch angioplasty of brachial artery.;  Surgeon: Angelia Mould, MD;  Location: [facility];  Service: Vascular;  Laterality: Left;  . BASCILIC VEIN TRANSPOSITION Right 02/26/2016   Procedure: Right BASCILIC VEIN TRANSPOSITION;  Surgeon: Angelia Mould, MD;  Location: [facility];  Service: Vascular;  Laterality: Right;  . CIRCUMCISION N/A 01/04/2014   Procedure: CIRCUMCISION ADULT (procedure #1);  Surgeon: Marissa Nestle, MD;  Location: [facility];  Service: Urology;  Laterality: N/A;  . COLECTOMY N/A 05/04/2017   Procedure: TOTAL COLECTOMY;  Surgeon: Aviva Signs, MD;  Location: [facility];  Service: General;  Laterality: N/A;  . COLONOSCOPY N/A 04/27/2017   Procedure: COLONOSCOPY;  Surgeon: Daneil Dolin, MD;  [facility];  Service: Endoscopy;  Laterality: N/A;  245  . CYSTOSCOPY W/ RETROGRADES Bilateral 06/29/2015   Procedure: CYSTOSCOPY, DILATION OF URETHRAL STRICTURE WITH BILATERAL RETROGRADE PYELOGRAM,SUPRAPUBIC TUBE CHANGE;  Surgeon: Festus Aloe, MD;  Location: [facility];  Service: Urology;  Laterality: Bilateral;  . CYSTOSCOPY WITH URETHRAL DILATATION N/A 12/29/2013   Procedure: CYSTOSCOPY WITH URETHRAL DILATATION;  Surgeon:  Marissa Nestle, MD;  Location[facility];  Service: Urology;  Laterality: N/A;  . ESOPHAGOGASTRODUODENOSCOPY N/A 04/27/2017   Procedure: ESOPHAGOGASTRODUODENOSCOPY (EGD);  Surgeon: Daneil Dolin, MD;  [facility];  Service: Endoscopy;  Laterality: N/A;  . INSERTION OF DIALYSIS CATHETER Right 11/25/2018   Procedure: INSERTION OF DIALYSIS CATHETER RIGHT INTERNAL JUGULAR;  Surgeon: Waynetta Sandy, MD;  Location: [facility];  Service: Vascular;  Laterality: Right;  . IR AV DIALY SHUNT INTRO [place] W/PTA/IMG RIGHT Right 09/07/2018  . IR FLUORO GUIDE CV LINE RIGHT  10/16/2020  . IR REMOVAL TUN CV CATH W/O FL  01/12/2019  . IR THROMBECTOMY AV FISTULA W/THROMBOLYSIS/PTA INC/SHUNT/IMG RIGHT Right 04/26/2018  . IR US GUIDE VASC ACCESS RIGHT  04/26/2018  . IR US GUIDE VASC ACCESS RIGHT  09/07/2018  . IR US GUIDE VASC ACCESS RIGHT  10/16/2020  . IR US GUIDE VASC ACCESS RIGHT  10/16/2020  . ORIF FEMUR FRACTURE Right 11/22/2016   Procedure: OPEN REDUCTION INTERNAL FIXATION (ORIF) DISTAL FEMUR FRACTURE;  Surgeon: Rod Can, MD;  Location: [facility];  Service: Orthopedics;  Laterality: Right;  . PATCH ANGIOPLASTY Right 12/10/2017   Procedure: PATCH ANGIOPLASTY;  Surgeon: Angelia Mould, MD;  Location: [facility] OR;  Service: Vascular;  Laterality: Right;  . PERIPHERAL VASCULAR BALLOON ANGIOPLASTY  08/13/2018   Procedure: PERIPHERAL VASCULAR BALLOON ANGIOPLASTY;  Surgeon: Elam Dutch, MD;  Location: [facility];  Service: Cardiovascular;;  right AV fistula   . PERIPHERAL VASCULAR BALLOON ANGIOPLASTY  11/22/2018   Procedure: PERIPHERAL VASCULAR BALLOON ANGIOPLASTY;  Surgeon: Waynetta Sandy, MD;  Location: [facility];  Service: Cardiovascular;;  rt AV fistula  . PERIPHERAL VASCULAR CATHETERIZATION N/A 10/08/2015   Procedure: A/V Shuntogram;  Surgeon: Angelia Mould, MD;  Location: [facility];  Service: Cardiovascular;  Laterality: N/A;   . THROMBECTOMY W/ EMBOLECTOMY Right 12/10/2017   Procedure: THROMBECTOMY REVISION RIGHT ARM  ARTERIOVENOUS FISTULA;  Surgeon: Angelia Mould, MD;  Location: [facility];  Service: Vascular;  Laterality: Right;  . TRANSURETHRAL RESECTION OF PROSTATE N/A 01/04/2014   Procedure: TRANSURETHRAL RESECTION OF THE PROSTATE (TURP) (procedure #2);  Surgeon: Marissa Nestle, MD;  [facility];  Service: Urology;  Laterality: N/A;    Social History   Socioeconomic History  . Marital status: Single    Spouse name: Not on file  . Number of children: Not on file  . Years of education: Not on file  . Highest education level: Not on file  Occupational History  . Occupation: retired   Tobacco Use  . Smoking status: Never Smoker  . Smokeless tobacco: Never Used  Vaping Use  . Vaping Use: Never used  Substance and Sexual Activity  . Alcohol use: No  . Drug use: No  . Sexual activity: Not Currently  Other Topics Concern  . 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   Long  term resident of [facility]    Social Determinants of Health   Financial Resource Strain: Not on file  Food Insecurity: Not on file  Transportation Needs: Not on file  Physical Activity: Not on file  Stress: Not on file  Social Connections: Not on file  Intimate Partner Violence: Not on file   Family History  Problem Relation Age of Onset  . Cancer Mother   . Colon cancer Neg Hx       VITAL SIGNS BP 100/70   Pulse 87   Temp 97.9 F (36.6 C)   Resp 20   Ht 5\' 8"  (1.727 m)   Wt 219 lb 3.2 oz (99.4 kg)   SpO2 100%   BMI 33.33 kg/m   Outpatient Encounter Medications as of 03/07/2021  Medication Sig  . atorvastatin (LIPITOR) 80 MG tablet Take 80 mg by mouth every evening.  . insulin aspart (NOVOLOG FLEXPEN) 100 UNIT/ML FlexPen Inject 20 Units into the skin daily. If cbg>150; subcutaneous  . insulin aspart (NOVOLOG) 100 UNIT/ML FlexPen  Inject 15 Units into the skin 2 (two) times daily. Hold insulin on dialysis days if cbg's less than 150 on mon,wed,fri  . Insulin Glargine (BASAGLAR KWIKPEN) 100 UNIT/ML SOPN Inject 45 Units into the skin at bedtime.   Marland Kitchen JANUVIA 25 MG tablet Take 25 mg by mouth daily.   Marland Kitchen latanoprost (XALATAN) 0.005 % ophthalmic solution Place 1 drop into both eyes at bedtime. 9 pm for glaucoma  . levothyroxine (SYNTHROID, LEVOTHROID) 88 MCG tablet Take 88 mcg by mouth daily before breakfast.  . midodrine (PROAMATINE) 10 MG tablet Take 10 mg by mouth. Send med with resident on Mon/Wed/Fri to dialysis. do not give at [facility] give to resident to take with him. send with 5mg  to equal 15mg .  . midodrine (PROAMATINE) 5 MG tablet Take 5 mg by mouth. Send with resident on dialysis days.  Mon, Wed, Fri.  Take along with 10 mg to equal 15 mg  . NON FORMULARY Diet Type:  NAS, Cons CHO Diet  . omega-3 acid ethyl esters (LOVAZA) 1 g capsule Take 2 g by mouth at bedtime.  Marland Kitchen omeprazole (PRILOSEC) 40 MG capsule Take 40 mg by mouth daily.   . polyethylene glycol (MIRALAX / GLYCOLAX) packet Take 17 g by mouth daily as needed for mild constipation.   . sevelamer carbonate (RENVELA) 800 MG tablet Take 2,400 mg by mouth 3 (three) times daily with meals.  . tamsulosin (FLOMAX) 0.4 MG CAPS capsule Take 0.4 mg by mouth every evening. Give 30 minutes after a meal  . torsemide (DEMADEX) 10 MG tablet Take 5 tablets (50 mg total) by mouth daily.  . [DISCONTINUED] NON FORMULARY 1200cc fluid restrictions. Dietary to provide 400cc/24hrs Nursing to give 7-3=140cc/24hrs 3-11=560cc/24hrs 11-7=100cc/24 Special Instructions: Document amount of fluids given each shift. Every Shift Day, Evening, Night   No facility-administered encounter medications on file as of 03/07/2021.     SIGNIFICANT DIAGNOSTIC EXAMS   LABS REVIEWED PREVIOUS;  04-09-20: wbc 6.2; hgb 10.7; hct 33.5; mcv 109.1 plt 144;glucose 185; bun 43; creat 10.75; k+ 4.3; na++  137; ca 8.7 liver normal albumin 3.7 hgb a1c 7.5 07-04-20: PSA: 2.79  07-19-20: hgb a1c 7.7 09-10-20: wbc 11.8; hgb 12.9; hct 40.4; mcv 108.6 plt 163; glucose 225; bun 30;creat 8.92; k+ 3.8; na++ 138; ca 9.2 10-12-20: chol 76; ldl 64; trig 319; hdl 24; hgb a1c 7.4  10-17-20: wbc 8.2; hgb 11.0; hct 34.3; mcv 108.2 plt 121; glucose 141; bun  51; creat 13.25; k+ 3.9; na++ 138; ca 8.5 mag 2.1  01-28-21: hgb a1c 6.5   TODAY  03-07-21: tsh 5.008   Review of Systems  Constitutional: Negative for malaise/fatigue.  Respiratory: Negative for cough and shortness of breath.   Cardiovascular: Negative for chest pain, palpitations and leg swelling.  Gastrointestinal: Negative for abdominal pain, constipation and heartburn.  Musculoskeletal: Negative for back pain, joint pain and myalgias.  Skin: Negative.   Neurological: Negative for dizziness.  Psychiatric/Behavioral: The patient is not nervous/anxious.     Physical Exam Constitutional:      General: He is not in acute distress.    Appearance: He is well-developed. He is not diaphoretic.  Neck:     Thyroid: No thyromegaly.  Cardiovascular:     Rate and Rhythm: Normal rate and regular rhythm.     Pulses: Normal pulses.     Heart sounds: Normal heart sounds.  Pulmonary:     Effort: Pulmonary effort is normal. No respiratory distress.     Breath sounds: Normal breath sounds.  Abdominal:     General: Bowel sounds are normal. There is no distension.     Palpations: Abdomen is soft.     Tenderness: There is no abdominal tenderness.  Genitourinary:    Comments: Hx turp Musculoskeletal:     Cervical back: Neck supple.     Right lower leg: No edema.     Left lower leg: No edema.     Comments: Is able to move all extremities History of right femur ORIF   Lymphadenopathy:     Cervical: No cervical adenopathy.  Skin:    General: Skin is warm and dry.     Comments: Right upper extremity a/v fistula + thrill /bruit   Neurological:     Mental  Status: He is alert. Mental status is at baseline.  Psychiatric:        Mood and Affect: Mood normal.       ASSESSMENT/ PLAN:  TODAY  1. Hypothyroidism due to acquired atrophy: is worse:  tsh 5.008; will increase synthroid to 100 mcg daily  Will continue to monitor his status.    Ok Edwards NP [facility]  Contact [phone] Monday through Friday 8am- 5pm  After hours call [phone]

## 2021-03-08 DIAGNOSIS — Z992 Dependence on renal dialysis: Secondary | ICD-10-CM | POA: Diagnosis not present

## 2021-03-08 DIAGNOSIS — D631 Anemia in chronic kidney disease: Secondary | ICD-10-CM | POA: Diagnosis not present

## 2021-03-08 DIAGNOSIS — N2581 Secondary hyperparathyroidism of renal origin: Secondary | ICD-10-CM | POA: Diagnosis not present

## 2021-03-08 DIAGNOSIS — N186 End stage renal disease: Secondary | ICD-10-CM | POA: Diagnosis not present

## 2021-03-08 DIAGNOSIS — D509 Iron deficiency anemia, unspecified: Secondary | ICD-10-CM | POA: Diagnosis not present

## 2021-03-11 DIAGNOSIS — N2581 Secondary hyperparathyroidism of renal origin: Secondary | ICD-10-CM | POA: Diagnosis not present

## 2021-03-11 DIAGNOSIS — D631 Anemia in chronic kidney disease: Secondary | ICD-10-CM | POA: Diagnosis not present

## 2021-03-11 DIAGNOSIS — Z992 Dependence on renal dialysis: Secondary | ICD-10-CM | POA: Diagnosis not present

## 2021-03-11 DIAGNOSIS — D509 Iron deficiency anemia, unspecified: Secondary | ICD-10-CM | POA: Diagnosis not present

## 2021-03-11 DIAGNOSIS — N186 End stage renal disease: Secondary | ICD-10-CM | POA: Diagnosis not present

## 2021-03-13 DIAGNOSIS — N2581 Secondary hyperparathyroidism of renal origin: Secondary | ICD-10-CM | POA: Diagnosis not present

## 2021-03-13 DIAGNOSIS — D631 Anemia in chronic kidney disease: Secondary | ICD-10-CM | POA: Diagnosis not present

## 2021-03-13 DIAGNOSIS — N186 End stage renal disease: Secondary | ICD-10-CM | POA: Diagnosis not present

## 2021-03-13 DIAGNOSIS — Z992 Dependence on renal dialysis: Secondary | ICD-10-CM | POA: Diagnosis not present

## 2021-03-13 DIAGNOSIS — D509 Iron deficiency anemia, unspecified: Secondary | ICD-10-CM | POA: Diagnosis not present

## 2021-03-15 DIAGNOSIS — N2581 Secondary hyperparathyroidism of renal origin: Secondary | ICD-10-CM | POA: Diagnosis not present

## 2021-03-15 DIAGNOSIS — D509 Iron deficiency anemia, unspecified: Secondary | ICD-10-CM | POA: Diagnosis not present

## 2021-03-15 DIAGNOSIS — D631 Anemia in chronic kidney disease: Secondary | ICD-10-CM | POA: Diagnosis not present

## 2021-03-15 DIAGNOSIS — Z992 Dependence on renal dialysis: Secondary | ICD-10-CM | POA: Diagnosis not present

## 2021-03-15 DIAGNOSIS — N186 End stage renal disease: Secondary | ICD-10-CM | POA: Diagnosis not present

## 2021-03-18 ENCOUNTER — Encounter: Payer: Self-pay | Admitting: Adult Health

## 2021-03-18 ENCOUNTER — Non-Acute Institutional Stay (SKILLED_NURSING_FACILITY): Payer: Medicare Other | Admitting: Adult Health

## 2021-03-18 DIAGNOSIS — N186 End stage renal disease: Secondary | ICD-10-CM | POA: Diagnosis not present

## 2021-03-18 DIAGNOSIS — E1122 Type 2 diabetes mellitus with diabetic chronic kidney disease: Secondary | ICD-10-CM

## 2021-03-18 DIAGNOSIS — Z992 Dependence on renal dialysis: Secondary | ICD-10-CM

## 2021-03-18 DIAGNOSIS — D509 Iron deficiency anemia, unspecified: Secondary | ICD-10-CM | POA: Diagnosis not present

## 2021-03-18 DIAGNOSIS — F321 Major depressive disorder, single episode, moderate: Secondary | ICD-10-CM

## 2021-03-18 DIAGNOSIS — I951 Orthostatic hypotension: Secondary | ICD-10-CM | POA: Diagnosis not present

## 2021-03-18 DIAGNOSIS — R600 Localized edema: Secondary | ICD-10-CM | POA: Diagnosis not present

## 2021-03-18 DIAGNOSIS — D631 Anemia in chronic kidney disease: Secondary | ICD-10-CM | POA: Diagnosis not present

## 2021-03-18 DIAGNOSIS — I120 Hypertensive chronic kidney disease with stage 5 chronic kidney disease or end stage renal disease: Secondary | ICD-10-CM | POA: Diagnosis not present

## 2021-03-18 DIAGNOSIS — N2581 Secondary hyperparathyroidism of renal origin: Secondary | ICD-10-CM | POA: Diagnosis not present

## 2021-03-18 NOTE — Progress Notes (Signed)
Location:  [place] Room Number: 112-D Place of Service:  SNF (31)   CODE STATUS: Full  Allergies  Allergen Reactions  . No Known Allergies     Chief Complaint  Patient presents with  . 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 There are no reports of pain; no reports of changes in appetite; no reports of anxiety or agitation.   Past Medical History:  Diagnosis Date  . Abnormal CT scan, kidney 10/06/2011  . Acute pyelonephritis 10/07/2011  . Anemia    normocytic  . Anxiety    mental retardation  . Bladder wall thickening 10/06/2011  . BPH (benign prostatic hypertrophy)   . Diabetes mellitus   . Dialysis patient [facility])    Tuesday, Thursday and Saturday,   . DVT of leg (deep venous thrombosis) ([place]) 12/25/2016  . Edema     history of lower extremity edema  . GERD (gastroesophageal reflux disease)   . Heme positive stool   . Hydronephrosis   . Hyperkalemia   . Hyperlipidemia   . Hypernatremia   . Hypertension   . Hypothyroidism   . Impaired speech   . 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 MR (mental retardation)   . Muscle weakness   . Obstructive uropathy   . 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 Poor historian poor historian  . Protein calorie malnutrition ([place])   . Pyelonephritis   . Renal failure (ARF), acute on chronic (HCC)   . Renal insufficiency    chronic history  . Sepsis ([place])   . Smoking   . Uremia   . Urinary retention   . UTI (lower urinary  tract infection) 10/06/2011    Past Surgical History:  Procedure Laterality Date  . A/V FISTULAGRAM N/A 08/13/2018   Procedure: A/V FISTULAGRAM - Right Upper;  Surgeon: Elam Dutch, MD;  Location: [place] CV LAB;  Service: Cardiovascular;  Laterality: N/A;  . A/V FISTULAGRAM N/A 11/22/2018   Procedure: A/V FISTULAGRAM - Right Upper;  Surgeon: Waynetta Sandy, MD;  Location: [place] CV LAB;  Service: Cardiovascular;  Laterality: N/A;  . AV FISTULA PLACEMENT Left 07/06/2015   Procedure:  INSERTION LEFT ARM ARTERIOVENOUS GORTEX GRAFT;  Surgeon: Angelia Mould, MD;  Location: [place];  Service: Vascular;  Laterality: Left;  . AV FISTULA PLACEMENT Right 02/26/2016   Procedure: ARTERIOVENOUS (AV) FISTULA CREATION ;  Surgeon: Angelia Mould, MD;  Location: [place];  Service: Vascular;  Laterality: Right;  . AV FISTULA PLACEMENT Right 11/25/2018   Procedure: INSERTION OF ARTERIOVENOUS (AV) ARTEGRAFT RIGHT UPPER ARM;  Surgeon: Waynetta Sandy, MD;  Location: [place];  Service: Vascular;  Laterality: Right;  . [place] REMOVAL Left 10/09/2015   Procedure: REMOVAL OF ARTERIOVENOUS GORETEX GRAFT ([place]) Evacuation of Lymphocele, Vein Patch angioplasty of brachial artery.;  Surgeon: Angelia Mould, MD;  Location: [place];  Service: Vascular;  Laterality: Left;  .  BASCILIC VEIN TRANSPOSITION Right 02/26/2016   Procedure: Right BASCILIC VEIN TRANSPOSITION;  Surgeon: Angelia Mould, MD;  Location: [place];  Service: Vascular;  Laterality: Right;  . CIRCUMCISION N/A 01/04/2014   Procedure: CIRCUMCISION ADULT (procedure #1);  Surgeon: Marissa Nestle, MD;  Location: [facility];  Service: Urology;  Laterality: N/A;  . COLECTOMY N/A 05/04/2017   Procedure: TOTAL COLECTOMY;  Surgeon: Aviva Signs, MD;  Location[facility];  Service: General;  Laterality: N/A;  . COLONOSCOPY N/A 04/27/2017   Procedure: COLONOSCOPY;  Surgeon: Daneil Dolin, MD;  [facility];  Service:  Endoscopy;  Laterality: N/A;  245  . CYSTOSCOPY W/ RETROGRADES Bilateral 06/29/2015   Procedure: CYSTOSCOPY, DILATION OF URETHRAL STRICTURE WITH BILATERAL RETROGRADE PYELOGRAM,SUPRAPUBIC TUBE CHANGE;  Surgeon: Festus Aloe, MD;  [facility]: [facility];  Service: Urology;  Laterality: Bilateral;  . CYSTOSCOPY WITH URETHRAL DILATATION N/A 12/29/2013   Procedure: CYSTOSCOPY WITH URETHRAL DILATATION;  Surgeon: Marissa Nestle, MD;  [facility];  Service: Urology;  Laterality: N/A;  . ESOPHAGOGASTRODUODENOSCOPY N/A 04/27/2017   Procedure: ESOPHAGOGASTRODUODENOSCOPY (EGD);  Surgeon: Daneil Dolin, MD;  [facility];  Service: Endoscopy;  Laterality: N/A;  . INSERTION OF DIALYSIS CATHETER Right 11/25/2018   Procedure: INSERTION OF DIALYSIS CATHETER RIGHT INTERNAL JUGULAR;  Surgeon: Waynetta Sandy, MD;  Location: [place];  Service: Vascular;  Laterality: Right;  . IR AV DIALY SHUNT INTRO [place] W/PTA/IMG RIGHT Right 09/07/2018  . IR FLUORO GUIDE CV LINE RIGHT  10/16/2020  . IR REMOVAL TUN CV CATH W/O FL  01/12/2019  . IR THROMBECTOMY AV FISTULA W/THROMBOLYSIS/PTA INC/SHUNT/IMG RIGHT Right 04/26/2018  . IR US GUIDE VASC ACCESS RIGHT  04/26/2018  . IR US GUIDE VASC ACCESS RIGHT  09/07/2018  . IR US GUIDE VASC ACCESS RIGHT  10/16/2020  . IR US GUIDE VASC ACCESS RIGHT  10/16/2020  . ORIF FEMUR FRACTURE Right 11/22/2016   Procedure: OPEN REDUCTION INTERNAL FIXATION (ORIF) DISTAL FEMUR FRACTURE;  Surgeon: Rod Can, MD;  Location: [place];  Service: Orthopedics;  Laterality: Right;  . PATCH ANGIOPLASTY Right 12/10/2017   Procedure: PATCH ANGIOPLASTY;  Surgeon: Angelia Mould, MD;  Location: [facility];  Service: Vascular;  Laterality: Right;  . PERIPHERAL VASCULAR BALLOON ANGIOPLASTY  08/13/2018   Procedure: PERIPHERAL VASCULAR BALLOON ANGIOPLASTY;  Surgeon: Elam Dutch, MD;  Location: [place] CV LAB;  Service: Cardiovascular;;  right AV fistula   . PERIPHERAL  VASCULAR BALLOON ANGIOPLASTY  11/22/2018   Procedure: PERIPHERAL VASCULAR BALLOON ANGIOPLASTY;  Surgeon: Waynetta Sandy, MD;  Location: [place] CV LAB;  Service: Cardiovascular;;  rt AV fistula  . PERIPHERAL VASCULAR CATHETERIZATION N/A 10/08/2015   Procedure: A/V Shuntogram;  Surgeon: Angelia Mould, MD;  Location: [place] CV LAB;  Service: Cardiovascular;  Laterality: N/A;  . THROMBECTOMY W/ EMBOLECTOMY Right 12/10/2017   Procedure: THROMBECTOMY REVISION RIGHT ARM  ARTERIOVENOUS FISTULA;  Surgeon: Angelia Mould, MD;  Location: [place];  Service: Vascular;  Laterality: Right;  . TRANSURETHRAL RESECTION OF PROSTATE N/A 01/04/2014   Procedure: TRANSURETHRAL RESECTION OF THE PROSTATE (TURP) (procedure #2);  Surgeon: Marissa Nestle, MD;  [facility];  Service: Urology;  Laterality: N/A;    Social History   Socioeconomic History  . Marital status: Single    Spouse name: Not on file  . Number of children: Not on file  . Years of education: Not on file  . Highest education level: Not on file  Occupational History  .  Occupation: retired   Tobacco Use  . Smoking status: Never Smoker  . Smokeless tobacco: Never Used  Vaping Use  . Vaping Use: Never used  Substance and Sexual Activity  . Alcohol use: No  . Drug use: No  . Sexual activity: Not Currently  Other Topics Concern  . 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   Long term resident of [facility]    Social Determinants of Health   Financial Resource Strain: Not on file  Food Insecurity: Not on file  Transportation Needs: Not on file  Physical Activity: Not on file  Stress: Not on file  Social Connections: Not on file  Intimate Partner Violence: Not on file   Family History  Problem Relation Age of Onset  . Cancer Mother   . Colon cancer Neg Hx       VITAL SIGNS BP 110/74   Pulse 90   Temp (!) 97.2 F (36.2 C)    Ht 5\' 8"  (1.727 m)   Wt 219 lb 3.2 oz (99.4 kg)   SpO2 100%   BMI 33.33 kg/m   Outpatient Encounter Medications as of 03/18/2021  Medication Sig  . atorvastatin (LIPITOR) 80 MG tablet Take 80 mg by mouth every evening.  . insulin aspart (NOVOLOG FLEXPEN) 100 UNIT/ML FlexPen Inject 20 Units into the skin daily. If cbg>150; subcutaneous  . insulin aspart (NOVOLOG) 100 UNIT/ML FlexPen Inject 15 Units into the skin 2 (two) times daily. Hold insulin on dialysis days if cbg's less than 150 on mon,wed,fri  . Insulin Glargine (BASAGLAR KWIKPEN) 100 UNIT/ML SOPN Inject 45 Units into the skin at bedtime.   Marland Kitchen JANUVIA 25 MG tablet Take 25 mg by mouth daily.   Marland Kitchen latanoprost (XALATAN) 0.005 % ophthalmic solution Place 1 drop into both eyes at bedtime. 9 pm for glaucoma  . levothyroxine (SYNTHROID) 100 MCG tablet Take 100 mcg by mouth daily before breakfast.  . midodrine (PROAMATINE) 10 MG tablet Take 10 mg by mouth. Send med with resident on Mon/Wed/Fri to dialysis. do not give [facility] give to resident to take with him. send with 5mg  to equal 15mg .  . midodrine (PROAMATINE) 5 MG tablet Take 5 mg by mouth. Send with resident on dialysis days.  Mon, Wed, Fri.  Take along with 10 mg to equal 15 mg  . NON FORMULARY Diet Type:  NAS, Cons CHO Diet  . omega-3 acid ethyl esters (LOVAZA) 1 g capsule Take 2 g by mouth at bedtime.  Marland Kitchen omeprazole (PRILOSEC) 40 MG capsule Take 40 mg by mouth daily.   . polyethylene glycol (MIRALAX / GLYCOLAX) packet Take 17 g by mouth daily as needed for mild constipation.   . sevelamer carbonate (RENVELA) 800 MG tablet Take 2,400 mg by mouth 3 (three) times daily with meals.  . tamsulosin (FLOMAX) 0.4 MG CAPS capsule Take 0.4 mg by mouth every evening. Give 30 minutes after a meal  . torsemide (DEMADEX) 10 MG tablet Take 5 tablets (50 mg total) by mouth daily.  . [DISCONTINUED] levothyroxine (SYNTHROID, LEVOTHROID) 88 MCG tablet Take 88 mcg by mouth daily before breakfast.    No facility-administered encounter medications on file as of 03/18/2021.     SIGNIFICANT DIAGNOSTIC EXAMS   LABS REVIEWED PREVIOUS;  04-09-20: wbc 6.2; hgb 10.7; hct 33.5; mcv 109.1 plt 144;glucose 185; bun 43; creat 10.75; k+ 4.3; na++ 137; ca 8.7 liver normal albumin 3.7 hgb a1c 7.5 07-04-20: PSA:  2.79  07-19-20: hgb a1c 7.7 09-10-20: wbc 11.8; hgb 12.9; hct 40.4; mcv 108.6 plt 163; glucose 225; bun 30;creat 8.92; k+ 3.8; na++ 138; ca 9.2 10-12-20: chol 76; ldl 64; trig 319; hdl 24; hgb a1c 7.4  10-17-20: wbc 8.2; hgb 11.0; hct 34.3; mcv 108.2 plt 121; glucose 141; bun 51; creat 13.25; k+ 3.9; na++ 138; ca 8.5 mag 2.1  01-28-21: hgb a1c 6.5  03-07-21: tsh 5.008   NO NEW LABS.   Review of Systems  Constitutional: Negative for malaise/fatigue.  Respiratory: Negative for cough and shortness of breath.   Cardiovascular: Negative for chest pain, palpitations and leg swelling.  Gastrointestinal: Negative for abdominal pain, constipation and heartburn.  Musculoskeletal: Negative for back pain, joint pain and myalgias.  Skin: Negative.   Neurological: Negative for dizziness.  Psychiatric/Behavioral: The patient is not nervous/anxious.      Physical Exam Constitutional:      General: He is not in acute distress.    Appearance: He is well-developed. He is not diaphoretic.  Neck:     Thyroid: No thyromegaly.  Cardiovascular:     Rate and Rhythm: Normal rate and regular rhythm.     Pulses: Normal pulses.     Heart sounds: Normal heart sounds.  Pulmonary:     Effort: Pulmonary effort is normal. No respiratory distress.     Breath sounds: Normal breath sounds.  Abdominal:     General: Bowel sounds are normal. There is no distension.     Palpations: Abdomen is soft.     Tenderness: There is no abdominal tenderness.  Genitourinary:    Comments: Hx of turp Musculoskeletal:        General: Normal range of motion.     Cervical back: Neck supple.     Right lower leg: No edema.      Left lower leg: No edema.     Comments: History of right femur ORIF    Lymphadenopathy:     Cervical: No cervical adenopathy.  Skin:    General: Skin is warm and dry.     Comments: Right upper extremity a/v fistula + thrill /bruit    Neurological:     Mental Status: He is alert. Mental status is at baseline.  Psychiatric:        Mood and Affect: Mood normal.      ASSESSMENT/ PLAN:  TODAY  1. Major depression single episode moderate: is stable is off medications will monitor   2. Type 2 diabetes mellitus with hypertension and end stage renal disease is stable hgb a1c 72; will continue januvia 25 mg daily basaglar 40 units nightly novolog 10 units with breakfast and lunch; 20 units with lunch  3. Bilateral lower extremity edema: is stable will continue demadex 50 mg daily   4. orthostatic hypotension: is stable b/p 110/74 will continue midodrine 15 mg with dialysis    PREVIOUS   5. Aortic atherosclerosis (ct 2019) is stable will monitor   6. GERD without esophagitis: is stable will continue prilosec 40 mg daily   7. History of colon cancer: is stable is status post colectomy 05-04-17.   8. Dyslipidemia associated with type 2 diabetes mellitus: LDL 64 trig 319 will continue lipitor 80 mg daily and lovaza 2 gm daily   9. 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: is stable will continue dialysis three days per week. Is followed by nephrology. Will continue midodrine 15 mg with dialysis 1200 cc fluid restriction; renvela  1600 mg with meals and 800 mg with snacks  10. Other specified hypothyroidism: is stable tsh 5.008  will continue synthroid 100 mcg daily   11. 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

## 2021-03-19 DIAGNOSIS — F4322 Adjustment disorder with anxiety: Secondary | ICD-10-CM | POA: Diagnosis not present

## 2021-03-19 DIAGNOSIS — F70 Mild intellectual disabilities: Secondary | ICD-10-CM | POA: Diagnosis not present

## 2021-03-20 DIAGNOSIS — D631 Anemia in chronic kidney disease: Secondary | ICD-10-CM | POA: Diagnosis not present

## 2021-03-20 DIAGNOSIS — N186 End stage renal disease: Secondary | ICD-10-CM | POA: Diagnosis not present

## 2021-03-20 DIAGNOSIS — Z992 Dependence on renal dialysis: Secondary | ICD-10-CM | POA: Diagnosis not present

## 2021-03-20 DIAGNOSIS — N2581 Secondary hyperparathyroidism of renal origin: Secondary | ICD-10-CM | POA: Diagnosis not present

## 2021-03-20 DIAGNOSIS — D509 Iron deficiency anemia, unspecified: Secondary | ICD-10-CM | POA: Diagnosis not present

## 2021-03-21 ENCOUNTER — Other Ambulatory Visit (HOSPITAL_COMMUNITY)
Admission: RE | Admit: 2021-03-21 | Discharge: 2021-03-21 | Disposition: A | Discharge status: Home / Self Care | Payer: Medicare Other | Source: Skilled Nursing Facility | Attending: Adult Health | Admitting: Adult Health

## 2021-03-21 DIAGNOSIS — I120 Hypertensive chronic kidney disease with stage 5 chronic kidney disease or end stage renal disease: Secondary | ICD-10-CM | POA: Diagnosis not present

## 2021-03-21 LAB — CBC
HCT: 36.5 % — ABNORMAL LOW (ref 39.0–52.0)
Hemoglobin: 12.1 g/dL — ABNORMAL LOW (ref 13.0–17.0)
MCH: 34.5 pg — ABNORMAL HIGH (ref 26.0–34.0)
MCHC: 33.2 g/dL (ref 30.0–36.0)
MCV: 104 fL — ABNORMAL HIGH (ref 80.0–100.0)
Platelets: 116 10*3/uL — ABNORMAL LOW (ref 150–400)
RBC: 3.51 MIL/uL — ABNORMAL LOW (ref 4.22–5.81)
RDW: 12.6 % (ref 11.5–15.5)
WBC: 5.9 10*3/uL (ref 4.0–10.5)
nRBC: 0 % (ref 0.0–0.2)

## 2021-03-21 LAB — COMPREHENSIVE METABOLIC PANEL
ALT: 25 U/L (ref 0–44)
AST: 21 U/L (ref 15–41)
Albumin: 3.6 g/dL (ref 3.5–5.0)
Alkaline Phosphatase: 86 U/L (ref 38–126)
Anion gap: 8 (ref 5–15)
BUN: 18 mg/dL (ref 8–23)
CO2: 26 mmol/L (ref 22–32)
Calcium: 8.9 mg/dL (ref 8.9–10.3)
Chloride: 99 mmol/L (ref 98–111)
Creatinine, Ser: 6.41 mg/dL — ABNORMAL HIGH (ref 0.61–1.24)
GFR, Estimated: 9 mL/min — ABNORMAL LOW (ref >60)
Glucose, Bld: 100 mg/dL — ABNORMAL HIGH (ref 70–99)
Potassium: 3.4 mmol/L — ABNORMAL LOW (ref 3.5–5.1)
Sodium: 133 mmol/L — ABNORMAL LOW (ref 135–145)
Total Bilirubin: 0.8 mg/dL (ref 0.3–1.2)
Total Protein: 7.6 g/dL (ref 6.5–8.1)

## 2021-03-21 LAB — LIPID PANEL
Cholesterol: 66 mg/dL (ref 0–200)
HDL: 21 mg/dL — ABNORMAL LOW (ref >40)
LDL Cholesterol: 5 mg/dL (ref 0–99)
Total CHOL/HDL Ratio: 3.1 RATIO
Triglycerides: 201 mg/dL — ABNORMAL HIGH (ref <150)
VLDL: 40 mg/dL (ref 0–40)

## 2021-03-21 LAB — TSH: TSH: 4.459 u[IU]/mL (ref 0.350–4.500)

## 2021-03-22 DIAGNOSIS — N186 End stage renal disease: Secondary | ICD-10-CM | POA: Diagnosis not present

## 2021-03-22 DIAGNOSIS — D631 Anemia in chronic kidney disease: Secondary | ICD-10-CM | POA: Diagnosis not present

## 2021-03-22 DIAGNOSIS — N2581 Secondary hyperparathyroidism of renal origin: Secondary | ICD-10-CM | POA: Diagnosis not present

## 2021-03-22 DIAGNOSIS — D509 Iron deficiency anemia, unspecified: Secondary | ICD-10-CM | POA: Diagnosis not present

## 2021-03-22 DIAGNOSIS — Z992 Dependence on renal dialysis: Secondary | ICD-10-CM | POA: Diagnosis not present

## 2021-03-25 DIAGNOSIS — D631 Anemia in chronic kidney disease: Secondary | ICD-10-CM | POA: Diagnosis not present

## 2021-03-25 DIAGNOSIS — N186 End stage renal disease: Secondary | ICD-10-CM | POA: Diagnosis not present

## 2021-03-25 DIAGNOSIS — N2581 Secondary hyperparathyroidism of renal origin: Secondary | ICD-10-CM | POA: Diagnosis not present

## 2021-03-25 DIAGNOSIS — Z992 Dependence on renal dialysis: Secondary | ICD-10-CM | POA: Diagnosis not present

## 2021-03-25 DIAGNOSIS — D509 Iron deficiency anemia, unspecified: Secondary | ICD-10-CM | POA: Diagnosis not present

## 2021-03-26 DIAGNOSIS — Z992 Dependence on renal dialysis: Secondary | ICD-10-CM | POA: Diagnosis not present

## 2021-03-26 DIAGNOSIS — N186 End stage renal disease: Secondary | ICD-10-CM | POA: Diagnosis not present

## 2021-03-27 DIAGNOSIS — N186 End stage renal disease: Secondary | ICD-10-CM | POA: Diagnosis not present

## 2021-03-27 DIAGNOSIS — N2581 Secondary hyperparathyroidism of renal origin: Secondary | ICD-10-CM | POA: Diagnosis not present

## 2021-03-27 DIAGNOSIS — D631 Anemia in chronic kidney disease: Secondary | ICD-10-CM | POA: Diagnosis not present

## 2021-03-27 DIAGNOSIS — Z992 Dependence on renal dialysis: Secondary | ICD-10-CM | POA: Diagnosis not present

## 2021-03-27 DIAGNOSIS — D509 Iron deficiency anemia, unspecified: Secondary | ICD-10-CM | POA: Diagnosis not present

## 2021-03-28 ENCOUNTER — Non-Acute Institutional Stay (SKILLED_NURSING_FACILITY): Payer: Medicare Other | Admitting: Adult Health

## 2021-03-28 ENCOUNTER — Encounter: Payer: Self-pay | Admitting: Adult Health

## 2021-03-28 DIAGNOSIS — I700 Atherosclerosis of aorta: Secondary | ICD-10-CM

## 2021-03-28 DIAGNOSIS — E1122 Type 2 diabetes mellitus with diabetic chronic kidney disease: Secondary | ICD-10-CM | POA: Diagnosis not present

## 2021-03-28 DIAGNOSIS — N186 End stage renal disease: Secondary | ICD-10-CM | POA: Diagnosis not present

## 2021-03-28 DIAGNOSIS — F321 Major depressive disorder, single episode, moderate: Secondary | ICD-10-CM

## 2021-03-28 DIAGNOSIS — Z992 Dependence on renal dialysis: Secondary | ICD-10-CM

## 2021-03-28 NOTE — Progress Notes (Signed)
Location:  [place] Room Number: 112-D Place of Service:  SNF (31)   CODE STATUS: Full  Allergies  Allergen Reactions  . No Known Allergies     Chief Complaint  Patient presents with  . Acute Visit    Care plan meeting.    HPI:  We have come together for his care plan meeting.  BIMS 8/15; mood 0/30. He is independent to supervision with her adls. He does feed himself. He is nonambulatory. There have been no falls. He is occasionally incontinent of bladder and bowel. He is noncompliant with his fluid restriction; is on hemodialysis three times weekly. Therapy none   Dietary weight is 219 pounds; which is stable.  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 Abnormal CT scan, kidney 10/06/2011  . Acute pyelonephritis 10/07/2011  . Anemia    normocytic  . Anxiety    mental retardation  . Bladder wall thickening 10/06/2011  . BPH (benign prostatic hypertrophy)   . Diabetes mellitus   . Dialysis patient [facility])    Tuesday, Thursday and Saturday,   . DVT of leg (deep venous thrombosis) ([place]) 12/25/2016  . Edema     history of lower extremity edema  . GERD (gastroesophageal reflux disease)   . Heme positive stool   . Hydronephrosis   . Hyperkalemia   . Hyperlipidemia   . Hypernatremia   . Hypertension   . Hypothyroidism   . Impaired speech   . 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 MR (mental retardation)   . Muscle weakness   . Obstructive uropathy   . 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 Poor historian poor historian  . Protein calorie malnutrition ([place])   . Pyelonephritis   . Renal failure (ARF), acute on chronic (HCC)   . Renal insufficiency    chronic history  . Sepsis ([place])   . Smoking   . Uremia   . Urinary retention   . UTI  (lower urinary tract infection) 10/06/2011    Past Surgical History:  Procedure Laterality Date  . A/V FISTULAGRAM N/A 08/13/2018   Procedure: A/V FISTULAGRAM - Right Upper;  Surgeon: Elam Dutch, MD;  Location: [place] CV LAB;  Service: Cardiovascular;  Laterality: N/A;  . A/V FISTULAGRAM N/A 11/22/2018   Procedure: A/V FISTULAGRAM - Right Upper;  Surgeon: Waynetta Sandy, MD;  Location: [place] [facility];  Service: Cardiovascular;  Laterality: N/A;  . AV FISTULA PLACEMENT Left 07/06/2015   Procedure:  INSERTION LEFT ARM ARTERIOVENOUS GORTEX GRAFT;  Surgeon: Angelia Mould, MD;  Location: [place];  Service: Vascular;  Laterality: Left;  . AV FISTULA PLACEMENT Right 02/26/2016   Procedure: ARTERIOVENOUS (AV) FISTULA CREATION ;  Surgeon: Angelia Mould, MD;  Location: [place];  Service: Vascular;  Laterality: Right;  . AV FISTULA PLACEMENT Right 11/25/2018   Procedure: INSERTION OF ARTERIOVENOUS (AV) ARTEGRAFT RIGHT UPPER ARM;  Surgeon: Waynetta Sandy, MD;  Location: [place];  Service: Vascular;  Laterality: Right;  . [place] REMOVAL Left 10/09/2015   Procedure: REMOVAL OF ARTERIOVENOUS GORETEX GRAFT ([place]) Evacuation of Lymphocele, Vein Patch angioplasty of brachial artery.;  Surgeon: Angelia Mould, MD;  Location: [place];  Service: Vascular;  Laterality: Left;  . BASCILIC VEIN TRANSPOSITION Right 02/26/2016   Procedure: Right BASCILIC  VEIN TRANSPOSITION;  Surgeon: Angelia Mould, MD;  Location: [place];  Service: Vascular;  Laterality: Right;  . CIRCUMCISION N/A 01/04/2014   Procedure: CIRCUMCISION ADULT (procedure #1);  Surgeon: Marissa Nestle, MD;  Location[facility];  Service: Urology;  Laterality: N/A;  . COLECTOMY N/A 05/04/2017   Procedure: TOTAL COLECTOMY;  Surgeon: Aviva Signs, MD;  Location: [facility];  Service: General;  Laterality: N/A;  . COLONOSCOPY N/A 04/27/2017   Procedure: COLONOSCOPY;  Surgeon: Daneil Dolin, MD;  [facility];  Service: Endoscopy;  Laterality: N/A;  245  . CYSTOSCOPY W/ RETROGRADES Bilateral 06/29/2015   Procedure: CYSTOSCOPY, DILATION OF URETHRAL STRICTURE WITH BILATERAL RETROGRADE PYELOGRAM,SUPRAPUBIC TUBE CHANGE;  Surgeon: Festus Aloe, MD;  Location: [facility];  Service: Urology;  Laterality: Bilateral;  . CYSTOSCOPY WITH URETHRAL DILATATION N/A 12/29/2013   Procedure: CYSTOSCOPY WITH URETHRAL DILATATION;  Surgeon: Marissa Nestle, MD;  [facility];  Service: Urology;  Laterality: N/A;  . ESOPHAGOGASTRODUODENOSCOPY N/A 04/27/2017   Procedure: ESOPHAGOGASTRODUODENOSCOPY (EGD);  Surgeon: Daneil Dolin, MD;  [facility];  Service: Endoscopy;  Laterality: N/A;  . INSERTION OF DIALYSIS CATHETER Right 11/25/2018   Procedure: INSERTION OF DIALYSIS CATHETER RIGHT INTERNAL JUGULAR;  Surgeon: Waynetta Sandy, MD;  Location: [place];  Service: Vascular;  Laterality: Right;  . IR AV DIALY SHUNT INTRO [place] W/PTA/IMG RIGHT Right 09/07/2018  . IR FLUORO GUIDE CV LINE RIGHT  10/16/2020  . IR REMOVAL TUN CV CATH W/O FL  01/12/2019  . IR THROMBECTOMY AV FISTULA W/THROMBOLYSIS/PTA INC/SHUNT/IMG RIGHT Right 04/26/2018  . IR US GUIDE VASC ACCESS RIGHT  04/26/2018  . IR US GUIDE VASC ACCESS RIGHT  09/07/2018  . IR US GUIDE VASC ACCESS RIGHT  10/16/2020  . IR US GUIDE VASC ACCESS RIGHT  10/16/2020  . ORIF FEMUR FRACTURE Right 11/22/2016   Procedure: OPEN REDUCTION INTERNAL FIXATION (ORIF) DISTAL FEMUR FRACTURE;  Surgeon: Rod Can, MD;  Location: [place];  Service: Orthopedics;  Laterality: Right;  . PATCH ANGIOPLASTY Right 12/10/2017   Procedure: PATCH ANGIOPLASTY;  Surgeon: Angelia Mould, MD;  Location: [facility] OR;  Service: Vascular;  Laterality: Right;  . PERIPHERAL VASCULAR BALLOON ANGIOPLASTY  08/13/2018   Procedure: PERIPHERAL VASCULAR BALLOON ANGIOPLASTY;  Surgeon: Elam Dutch, MD;  Location: [place] CV LAB;  Service: Cardiovascular;;  right AV  fistula   . PERIPHERAL VASCULAR BALLOON ANGIOPLASTY  11/22/2018   Procedure: PERIPHERAL VASCULAR BALLOON ANGIOPLASTY;  Surgeon: Waynetta Sandy, MD;  Location: [place] [facility];  Service: Cardiovascular;;  rt AV fistula  . PERIPHERAL VASCULAR CATHETERIZATION N/A 10/08/2015   Procedure: A/V Shuntogram;  Surgeon: Angelia Mould, MD;  Location: [place] CV LAB;  Service: Cardiovascular;  Laterality: N/A;  . THROMBECTOMY W/ EMBOLECTOMY Right 12/10/2017   Procedure: THROMBECTOMY REVISION RIGHT ARM  ARTERIOVENOUS FISTULA;  Surgeon: Angelia Mould, MD;  Location: [place];  Service: Vascular;  Laterality: Right;  . TRANSURETHRAL RESECTION OF PROSTATE N/A 01/04/2014   Procedure: TRANSURETHRAL RESECTION OF THE PROSTATE (TURP) (procedure #2);  Surgeon: Marissa Nestle, MD;  [facility];  Service: Urology;  Laterality: N/A;    Social History   Socioeconomic History  . Marital status: Single    Spouse name: Not on file  . Number of children: Not on file  . Years of education: Not on file  . Highest education level: Not on file  Occupational History  . Occupation: retired   Tobacco Use  .  Smoking status: Never Smoker  . Smokeless tobacco: Never Used  Vaping Use  . Vaping Use: Never used  Substance and Sexual Activity  . Alcohol use: No  . Drug use: No  . Sexual activity: Not Currently  Other Topics Concern  . 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   Long [facility] resident of [facility]    Social Determinants of Health   Financial Resource Strain: Not on file  Food Insecurity: Not on file  Transportation Needs: Not on file  Physical Activity: Not on file  Stress: Not on file  Social Connections: Not on file  Intimate Partner Violence: Not on file   Family History  Problem Relation Age of Onset  . Cancer Mother   . Colon cancer Neg Hx       VITAL SIGNS BP 112/76   Pulse 88    Temp (!) 96.9 F (36.1 C)   Ht 5\' 8"  (1.727 m)   Wt 218 lb 9.6 oz (99.2 kg)   SpO2 100%   BMI 33.24 kg/m   Outpatient Encounter Medications as of 03/28/2021  Medication Sig  . atorvastatin (LIPITOR) 80 MG tablet Take 80 mg by mouth every evening.  . insulin aspart (NOVOLOG FLEXPEN) 100 UNIT/ML FlexPen Inject 20 Units into the skin daily. If cbg>150; subcutaneous  . insulin aspart (NOVOLOG) 100 UNIT/ML FlexPen Inject 15 Units into the skin 2 (two) times daily. Hold insulin on dialysis days if cbg's less than 150 on mon,wed,fri  . Insulin Glargine (BASAGLAR KWIKPEN) 100 UNIT/ML SOPN Inject 45 Units into the skin at bedtime.   Marland Kitchen JANUVIA 25 MG tablet Take 25 mg by mouth daily.   Marland Kitchen latanoprost (XALATAN) 0.005 % ophthalmic solution Place 1 drop into both eyes at bedtime. 9 pm for glaucoma  . levothyroxine (SYNTHROID) 100 MCG tablet Take 100 mcg by mouth daily before breakfast.  . midodrine (PROAMATINE) 10 MG tablet Take 10 mg by mouth. Send med with resident on Mon/Wed/Fri to dialysis. do not give at [facility] give to resident to take with him. send with 5mg  to equal 15mg .  . midodrine (PROAMATINE) 5 MG tablet Take 5 mg by mouth. Send with resident on dialysis days.  Mon, Wed, Fri.  Take along with 10 mg to equal 15 mg  . NON FORMULARY Diet Type:  NAS, Cons CHO Diet  . omega-3 acid ethyl esters (LOVAZA) 1 g capsule Take 2 g by mouth at bedtime.  Marland Kitchen omeprazole (PRILOSEC) 40 MG capsule Take 40 mg by mouth daily.   . polyethylene glycol (MIRALAX / GLYCOLAX) packet Take 17 g by mouth daily as needed for mild constipation.   . sevelamer carbonate (RENVELA) 800 MG tablet Take 2,400 mg by mouth 3 (three) times daily with meals.  . tamsulosin (FLOMAX) 0.4 MG CAPS capsule Take 0.4 mg by mouth every evening. Give 30 minutes after a meal  . torsemide (DEMADEX) 10 MG tablet Take 5 tablets (50 mg total) by mouth daily.   No facility-administered encounter medications on file as of 03/28/2021.      SIGNIFICANT DIAGNOSTIC EXAMS   LABS REVIEWED PREVIOUS;  04-09-20: wbc 6.2; hgb 10.7; hct 33.5; mcv 109.1 plt 144;glucose 185; bun 43; creat 10.75; k+ 4.3; na++ 137; ca 8.7 liver normal albumin 3.7 hgb a1c 7.5 07-04-20: PSA: 2.79  07-19-20: hgb a1c 7.7 09-10-20: wbc 11.8; hgb 12.9; hct 40.4; mcv 108.6 plt 163; glucose 225; bun 30;creat 8.92; k+ 3.8; na++  138; ca 9.2 10-12-20: chol 76; ldl 64; trig 319; hdl 24; hgb a1c 7.4  10-17-20: wbc 8.2; hgb 11.0; hct 34.3; mcv 108.2 plt 121; glucose 141; bun 51; creat 13.25; k+ 3.9; na++ 138; ca 8.5 mag 2.1  01-28-21: hgb a1c 6.5  03-07-21: tsh 5.008   NO NEW LABS.   Review of Systems  Constitutional: Negative for malaise/fatigue.  Respiratory: Negative for cough and shortness of breath.   Cardiovascular: Negative for chest pain, palpitations and leg swelling.  Gastrointestinal: Negative for abdominal pain, constipation and heartburn.  Musculoskeletal: Negative for back pain, joint pain and myalgias.  Skin: Negative.   Neurological: Negative for dizziness.  Psychiatric/Behavioral: The patient is not nervous/anxious.      Physical Exam Constitutional:      General: He is not in acute distress.    Appearance: He is well-developed. He is not diaphoretic.  Neck:     Thyroid: No thyromegaly.  Cardiovascular:     Rate and Rhythm: Normal rate and regular rhythm.     Pulses: Normal pulses.     Heart sounds: Normal heart sounds.  Pulmonary:     Effort: Pulmonary effort is normal. No respiratory distress.     Breath sounds: Normal breath sounds.  Abdominal:     General: Bowel sounds are normal. There is no distension.     Palpations: Abdomen is soft.     Tenderness: There is no abdominal tenderness.  Genitourinary:    Comments: Hx of turp Musculoskeletal:        General: Normal range of motion.     Cervical back: Neck supple.     Comments: History of right femur ORIF     Lymphadenopathy:     Cervical: No cervical adenopathy.  Skin:     General: Skin is warm and dry.     Comments:  Right upper extremity a/v fistula + thrill /bruit     Neurological:     Mental Status: He is alert. Mental status is at baseline.  Psychiatric:        Mood and Affect: Mood normal.       ASSESSMENT/ PLAN:  TODAY  1. Aortic atherosclerosis 2.  End stage renal disease due to type 2 diabetes mellitus 3. Depression major single episode moderate 4. Dependence of dialysis   Will continue current medications Will continue  Current plan of care.  Will continue to monitor his status.   Time spent with patient: 40 minutes: medications; care plan; goals of care; dialysis     Ok Edwards NP [facility]  Contact [phone] Monday through Friday 8am- 5pm  After hours call [phone]

## 2021-03-29 DIAGNOSIS — N186 End stage renal disease: Secondary | ICD-10-CM | POA: Diagnosis not present

## 2021-03-29 DIAGNOSIS — D631 Anemia in chronic kidney disease: Secondary | ICD-10-CM | POA: Diagnosis not present

## 2021-03-29 DIAGNOSIS — D509 Iron deficiency anemia, unspecified: Secondary | ICD-10-CM | POA: Diagnosis not present

## 2021-03-29 DIAGNOSIS — N2581 Secondary hyperparathyroidism of renal origin: Secondary | ICD-10-CM | POA: Diagnosis not present

## 2021-03-29 DIAGNOSIS — Z992 Dependence on renal dialysis: Secondary | ICD-10-CM | POA: Diagnosis not present

## 2021-04-01 DIAGNOSIS — Z992 Dependence on renal dialysis: Secondary | ICD-10-CM | POA: Diagnosis not present

## 2021-04-01 DIAGNOSIS — N186 End stage renal disease: Secondary | ICD-10-CM | POA: Diagnosis not present

## 2021-04-01 DIAGNOSIS — D631 Anemia in chronic kidney disease: Secondary | ICD-10-CM | POA: Diagnosis not present

## 2021-04-01 DIAGNOSIS — N2581 Secondary hyperparathyroidism of renal origin: Secondary | ICD-10-CM | POA: Diagnosis not present

## 2021-04-01 DIAGNOSIS — D509 Iron deficiency anemia, unspecified: Secondary | ICD-10-CM | POA: Diagnosis not present

## 2021-04-03 DIAGNOSIS — N186 End stage renal disease: Secondary | ICD-10-CM | POA: Diagnosis not present

## 2021-04-03 DIAGNOSIS — Z992 Dependence on renal dialysis: Secondary | ICD-10-CM | POA: Diagnosis not present

## 2021-04-03 DIAGNOSIS — D509 Iron deficiency anemia, unspecified: Secondary | ICD-10-CM | POA: Diagnosis not present

## 2021-04-03 DIAGNOSIS — D631 Anemia in chronic kidney disease: Secondary | ICD-10-CM | POA: Diagnosis not present

## 2021-04-03 DIAGNOSIS — N2581 Secondary hyperparathyroidism of renal origin: Secondary | ICD-10-CM | POA: Diagnosis not present

## 2021-04-05 DIAGNOSIS — N2581 Secondary hyperparathyroidism of renal origin: Secondary | ICD-10-CM | POA: Diagnosis not present

## 2021-04-05 DIAGNOSIS — Z992 Dependence on renal dialysis: Secondary | ICD-10-CM | POA: Diagnosis not present

## 2021-04-05 DIAGNOSIS — D631 Anemia in chronic kidney disease: Secondary | ICD-10-CM | POA: Diagnosis not present

## 2021-04-05 DIAGNOSIS — D509 Iron deficiency anemia, unspecified: Secondary | ICD-10-CM | POA: Diagnosis not present

## 2021-04-05 DIAGNOSIS — N186 End stage renal disease: Secondary | ICD-10-CM | POA: Diagnosis not present

## 2021-04-08 DIAGNOSIS — D631 Anemia in chronic kidney disease: Secondary | ICD-10-CM | POA: Diagnosis not present

## 2021-04-08 DIAGNOSIS — D509 Iron deficiency anemia, unspecified: Secondary | ICD-10-CM | POA: Diagnosis not present

## 2021-04-08 DIAGNOSIS — Z992 Dependence on renal dialysis: Secondary | ICD-10-CM | POA: Diagnosis not present

## 2021-04-08 DIAGNOSIS — N186 End stage renal disease: Secondary | ICD-10-CM | POA: Diagnosis not present

## 2021-04-08 DIAGNOSIS — N2581 Secondary hyperparathyroidism of renal origin: Secondary | ICD-10-CM | POA: Diagnosis not present

## 2021-04-10 DIAGNOSIS — N2581 Secondary hyperparathyroidism of renal origin: Secondary | ICD-10-CM | POA: Diagnosis not present

## 2021-04-10 DIAGNOSIS — D509 Iron deficiency anemia, unspecified: Secondary | ICD-10-CM | POA: Diagnosis not present

## 2021-04-10 DIAGNOSIS — N186 End stage renal disease: Secondary | ICD-10-CM | POA: Diagnosis not present

## 2021-04-10 DIAGNOSIS — D631 Anemia in chronic kidney disease: Secondary | ICD-10-CM | POA: Diagnosis not present

## 2021-04-10 DIAGNOSIS — Z992 Dependence on renal dialysis: Secondary | ICD-10-CM | POA: Diagnosis not present

## 2021-04-12 DIAGNOSIS — N186 End stage renal disease: Secondary | ICD-10-CM | POA: Diagnosis not present

## 2021-04-12 DIAGNOSIS — D631 Anemia in chronic kidney disease: Secondary | ICD-10-CM | POA: Diagnosis not present

## 2021-04-12 DIAGNOSIS — Z992 Dependence on renal dialysis: Secondary | ICD-10-CM | POA: Diagnosis not present

## 2021-04-12 DIAGNOSIS — D509 Iron deficiency anemia, unspecified: Secondary | ICD-10-CM | POA: Diagnosis not present

## 2021-04-12 DIAGNOSIS — N2581 Secondary hyperparathyroidism of renal origin: Secondary | ICD-10-CM | POA: Diagnosis not present

## 2021-04-15 DIAGNOSIS — D631 Anemia in chronic kidney disease: Secondary | ICD-10-CM | POA: Diagnosis not present

## 2021-04-15 DIAGNOSIS — E1151 Type 2 diabetes mellitus with diabetic peripheral angiopathy without gangrene: Secondary | ICD-10-CM | POA: Diagnosis not present

## 2021-04-15 DIAGNOSIS — L602 Onychogryphosis: Secondary | ICD-10-CM | POA: Diagnosis not present

## 2021-04-15 DIAGNOSIS — N2581 Secondary hyperparathyroidism of renal origin: Secondary | ICD-10-CM | POA: Diagnosis not present

## 2021-04-15 DIAGNOSIS — Z992 Dependence on renal dialysis: Secondary | ICD-10-CM | POA: Diagnosis not present

## 2021-04-15 DIAGNOSIS — D509 Iron deficiency anemia, unspecified: Secondary | ICD-10-CM | POA: Diagnosis not present

## 2021-04-15 DIAGNOSIS — N186 End stage renal disease: Secondary | ICD-10-CM | POA: Diagnosis not present

## 2021-04-16 DIAGNOSIS — F70 Mild intellectual disabilities: Secondary | ICD-10-CM | POA: Diagnosis not present

## 2021-04-16 DIAGNOSIS — F4322 Adjustment disorder with anxiety: Secondary | ICD-10-CM | POA: Diagnosis not present

## 2021-04-17 DIAGNOSIS — D631 Anemia in chronic kidney disease: Secondary | ICD-10-CM | POA: Diagnosis not present

## 2021-04-17 DIAGNOSIS — N186 End stage renal disease: Secondary | ICD-10-CM | POA: Diagnosis not present

## 2021-04-17 DIAGNOSIS — N2581 Secondary hyperparathyroidism of renal origin: Secondary | ICD-10-CM | POA: Diagnosis not present

## 2021-04-17 DIAGNOSIS — Z992 Dependence on renal dialysis: Secondary | ICD-10-CM | POA: Diagnosis not present

## 2021-04-17 DIAGNOSIS — D509 Iron deficiency anemia, unspecified: Secondary | ICD-10-CM | POA: Diagnosis not present

## 2021-04-19 DIAGNOSIS — N2581 Secondary hyperparathyroidism of renal origin: Secondary | ICD-10-CM | POA: Diagnosis not present

## 2021-04-19 DIAGNOSIS — N186 End stage renal disease: Secondary | ICD-10-CM | POA: Diagnosis not present

## 2021-04-19 DIAGNOSIS — D631 Anemia in chronic kidney disease: Secondary | ICD-10-CM | POA: Diagnosis not present

## 2021-04-19 DIAGNOSIS — Z992 Dependence on renal dialysis: Secondary | ICD-10-CM | POA: Diagnosis not present

## 2021-04-19 DIAGNOSIS — D509 Iron deficiency anemia, unspecified: Secondary | ICD-10-CM | POA: Diagnosis not present

## 2021-04-22 DIAGNOSIS — D631 Anemia in chronic kidney disease: Secondary | ICD-10-CM | POA: Diagnosis not present

## 2021-04-22 DIAGNOSIS — N2581 Secondary hyperparathyroidism of renal origin: Secondary | ICD-10-CM | POA: Diagnosis not present

## 2021-04-22 DIAGNOSIS — N186 End stage renal disease: Secondary | ICD-10-CM | POA: Diagnosis not present

## 2021-04-22 DIAGNOSIS — D509 Iron deficiency anemia, unspecified: Secondary | ICD-10-CM | POA: Diagnosis not present

## 2021-04-22 DIAGNOSIS — Z992 Dependence on renal dialysis: Secondary | ICD-10-CM | POA: Diagnosis not present

## 2021-04-24 DIAGNOSIS — Z992 Dependence on renal dialysis: Secondary | ICD-10-CM | POA: Diagnosis not present

## 2021-04-24 DIAGNOSIS — N186 End stage renal disease: Secondary | ICD-10-CM | POA: Diagnosis not present

## 2021-04-24 DIAGNOSIS — D631 Anemia in chronic kidney disease: Secondary | ICD-10-CM | POA: Diagnosis not present

## 2021-04-24 DIAGNOSIS — D509 Iron deficiency anemia, unspecified: Secondary | ICD-10-CM | POA: Diagnosis not present

## 2021-04-24 DIAGNOSIS — N2581 Secondary hyperparathyroidism of renal origin: Secondary | ICD-10-CM | POA: Diagnosis not present

## 2021-04-25 ENCOUNTER — Other Ambulatory Visit (HOSPITAL_COMMUNITY)
Admission: RE | Admit: 2021-04-25 | Discharge: 2021-04-25 | Disposition: A | Discharge status: Home / Self Care | Payer: Medicare Other | Source: Skilled Nursing Facility | Attending: Anesthesiology | Admitting: Anesthesiology

## 2021-04-25 DIAGNOSIS — N186 End stage renal disease: Secondary | ICD-10-CM | POA: Diagnosis not present

## 2021-04-25 DIAGNOSIS — Z992 Dependence on renal dialysis: Secondary | ICD-10-CM | POA: Diagnosis not present

## 2021-04-25 DIAGNOSIS — U071 COVID-19: Secondary | ICD-10-CM | POA: Diagnosis not present

## 2021-04-25 LAB — SARS CORONAVIRUS 2 (TAT 6-24 HRS): SARS Coronavirus 2: NEGATIVE

## 2021-04-26 DIAGNOSIS — N2581 Secondary hyperparathyroidism of renal origin: Secondary | ICD-10-CM | POA: Diagnosis not present

## 2021-04-26 DIAGNOSIS — Z992 Dependence on renal dialysis: Secondary | ICD-10-CM | POA: Diagnosis not present

## 2021-04-26 DIAGNOSIS — D509 Iron deficiency anemia, unspecified: Secondary | ICD-10-CM | POA: Diagnosis not present

## 2021-04-26 DIAGNOSIS — D631 Anemia in chronic kidney disease: Secondary | ICD-10-CM | POA: Diagnosis not present

## 2021-04-26 DIAGNOSIS — N186 End stage renal disease: Secondary | ICD-10-CM | POA: Diagnosis not present

## 2021-04-29 DIAGNOSIS — N2581 Secondary hyperparathyroidism of renal origin: Secondary | ICD-10-CM | POA: Diagnosis not present

## 2021-04-29 DIAGNOSIS — Z992 Dependence on renal dialysis: Secondary | ICD-10-CM | POA: Diagnosis not present

## 2021-04-29 DIAGNOSIS — N186 End stage renal disease: Secondary | ICD-10-CM | POA: Diagnosis not present

## 2021-04-29 DIAGNOSIS — D509 Iron deficiency anemia, unspecified: Secondary | ICD-10-CM | POA: Diagnosis not present

## 2021-04-29 DIAGNOSIS — D631 Anemia in chronic kidney disease: Secondary | ICD-10-CM | POA: Diagnosis not present

## 2021-04-30 DIAGNOSIS — E1129 Type 2 diabetes mellitus with other diabetic kidney complication: Secondary | ICD-10-CM | POA: Diagnosis not present

## 2021-04-30 DIAGNOSIS — I120 Hypertensive chronic kidney disease with stage 5 chronic kidney disease or end stage renal disease: Secondary | ICD-10-CM | POA: Diagnosis not present

## 2021-04-30 DIAGNOSIS — Z1159 Encounter for screening for other viral diseases: Secondary | ICD-10-CM | POA: Diagnosis not present

## 2021-05-01 DIAGNOSIS — D509 Iron deficiency anemia, unspecified: Secondary | ICD-10-CM | POA: Diagnosis not present

## 2021-05-01 DIAGNOSIS — N186 End stage renal disease: Secondary | ICD-10-CM | POA: Diagnosis not present

## 2021-05-01 DIAGNOSIS — D631 Anemia in chronic kidney disease: Secondary | ICD-10-CM | POA: Diagnosis not present

## 2021-05-01 DIAGNOSIS — Z992 Dependence on renal dialysis: Secondary | ICD-10-CM | POA: Diagnosis not present

## 2021-05-01 DIAGNOSIS — N2581 Secondary hyperparathyroidism of renal origin: Secondary | ICD-10-CM | POA: Diagnosis not present

## 2021-05-02 ENCOUNTER — Encounter (HOSPITAL_COMMUNITY)
Admission: RE | Admit: 2021-05-02 | Discharge: 2021-05-02 | Disposition: A | Discharge status: Home / Self Care | Payer: Medicare Other | Source: Skilled Nursing Facility | Attending: Adult Health | Admitting: Adult Health

## 2021-05-02 DIAGNOSIS — U071 COVID-19: Secondary | ICD-10-CM | POA: Diagnosis not present

## 2021-05-02 LAB — D-DIMER, QUANTITATIVE: D-Dimer, Quant: 0.95 ug/mL-FEU — ABNORMAL HIGH (ref 0.00–0.50)

## 2021-05-02 LAB — BASIC METABOLIC PANEL
Anion gap: 11 (ref 5–15)
BUN: 17 mg/dL (ref 8–23)
CO2: 27 mmol/L (ref 22–32)
Calcium: 9.1 mg/dL (ref 8.9–10.3)
Chloride: 100 mmol/L (ref 98–111)
Creatinine, Ser: 7.94 mg/dL — ABNORMAL HIGH (ref 0.61–1.24)
GFR, Estimated: 7 mL/min — ABNORMAL LOW (ref >60)
Glucose, Bld: 105 mg/dL — ABNORMAL HIGH (ref 70–99)
Potassium: 3.8 mmol/L (ref 3.5–5.1)
Sodium: 138 mmol/L (ref 135–145)

## 2021-05-02 LAB — C-REACTIVE PROTEIN: CRP: 0.7 mg/dL (ref <1.0)

## 2021-05-03 ENCOUNTER — Non-Acute Institutional Stay (SKILLED_NURSING_FACILITY): Payer: Medicare Other | Admitting: Adult Health

## 2021-05-03 ENCOUNTER — Encounter: Payer: Self-pay | Admitting: Adult Health

## 2021-05-03 DIAGNOSIS — E119 Type 2 diabetes mellitus without complications: Secondary | ICD-10-CM

## 2021-05-03 DIAGNOSIS — U071 COVID-19: Secondary | ICD-10-CM

## 2021-05-03 LAB — CBC
HCT: 33.7 % — ABNORMAL LOW (ref 39.0–52.0)
Hemoglobin: 10.9 g/dL — ABNORMAL LOW (ref 13.0–17.0)
MCH: 34.2 pg — ABNORMAL HIGH (ref 26.0–34.0)
MCHC: 32.3 g/dL (ref 30.0–36.0)
MCV: 105.6 fL — ABNORMAL HIGH (ref 80.0–100.0)
Platelets: 106 10*3/uL — ABNORMAL LOW (ref 150–400)
RBC: 3.19 MIL/uL — ABNORMAL LOW (ref 4.22–5.81)
RDW: 12.6 % (ref 11.5–15.5)
WBC: 3.8 10*3/uL — ABNORMAL LOW (ref 4.0–10.5)
nRBC: 0 % (ref 0.0–0.2)

## 2021-05-03 NOTE — Progress Notes (Signed)
l Location:  [facility] Room Number: 112-D Place of Service:  SNF (31)   CODE STATUS: Full  Allergies  Allergen Reactions  . No Known Allergies     Chief Complaint  Patient presents with  . Acute Visit    Covid positive     HPI:  He has tested positive for covid. There are no reports of fevers; no body aches; no fatigue; no loss of smell or taste. No reports of fevers present. His d-dimer is slightly elevated at 0.95; he will need asa. He is presently on antiviral medication.     Past Medical History:  Diagnosis Date  . Abnormal CT scan, kidney 10/06/2011  . Acute pyelonephritis 10/07/2011  . Anemia    normocytic  . Anxiety    mental retardation  . Bladder wall thickening 10/06/2011  . BPH (benign prostatic hypertrophy)   . Diabetes mellitus   . Dialysis patient [facility])    Tuesday, Thursday and Saturday,   . DVT of leg (deep venous thrombosis) ([place]) 12/25/2016  . Edema     history of lower extremity edema  . GERD (gastroesophageal reflux disease)   . Heme positive stool   . Hydronephrosis   . Hyperkalemia   . Hyperlipidemia   . Hypernatremia   . Hypertension   . Hypothyroidism   . Impaired speech   . 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 MR (mental retardation)   . Muscle weakness   . Obstructive uropathy   . 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 Poor historian poor historian  . Protein calorie malnutrition ([place])   . Pyelonephritis   . Renal failure (ARF), acute on chronic (HCC)   . Renal insufficiency    chronic history  . Sepsis ([place])   . Smoking   . Uremia   . Urinary retention   . UTI (lower urinary tract infection) 10/06/2011    Past Surgical History:  Procedure Laterality Date  . A/V FISTULAGRAM N/A 08/13/2018   Procedure: A/V FISTULAGRAM - Right Upper;  Surgeon: Elam Dutch, MD;  Location: [place] CV LAB;  Service: Cardiovascular;  Laterality: N/A;  . A/V FISTULAGRAM N/A 11/22/2018   Procedure: A/V  FISTULAGRAM - Right Upper;  Surgeon: Waynetta Sandy, MD;  Location: [place] CV LAB;  Service: Cardiovascular;  Laterality: N/A;  . AV FISTULA PLACEMENT Left 07/06/2015   Procedure:  INSERTION LEFT ARM ARTERIOVENOUS GORTEX GRAFT;  Surgeon: Angelia Mould, MD;  Location: [place];  Service: Vascular;  Laterality: Left;  . AV FISTULA PLACEMENT Right 02/26/2016   Procedure: ARTERIOVENOUS (AV) FISTULA CREATION ;  Surgeon: Angelia Mould, MD;  Location: [place];  Service: Vascular;  Laterality: Right;  . AV FISTULA PLACEMENT Right 11/25/2018   Procedure: INSERTION OF ARTERIOVENOUS (AV) ARTEGRAFT RIGHT UPPER ARM;  Surgeon: Waynetta Sandy, MD;  Location: [place];  Service: Vascular;  Laterality: Right;  . [place] REMOVAL Left 10/09/2015   Procedure: REMOVAL OF ARTERIOVENOUS GORETEX GRAFT ([place]) Evacuation of Lymphocele, Vein Patch angioplasty of brachial artery.;  Surgeon: Angelia Mould, MD;  Location: [place];  Service: Vascular;  Laterality: Left;  . BASCILIC VEIN TRANSPOSITION Right 02/26/2016   Procedure: Right BASCILIC VEIN TRANSPOSITION;  Surgeon: Angelia Mould, MD;  Location: [place];  Service: Vascular;  Laterality: Right;  . CIRCUMCISION N/A 01/04/2014   Procedure: CIRCUMCISION ADULT (procedure #1);  Surgeon: Marissa Nestle, MD;  [facility];  Service: Urology;  Laterality: N/A;  . COLECTOMY N/A  05/04/2017   Procedure: TOTAL COLECTOMY;  Surgeon: Aviva Signs, MD;  Location: [facility];  Service: General;  Laterality: N/A;  . COLONOSCOPY N/A 04/27/2017   Procedure: COLONOSCOPY;  Surgeon: Daneil Dolin, MD;  [facility];  Service: Endoscopy;  Laterality: N/A;  245  . CYSTOSCOPY W/ RETROGRADES Bilateral 06/29/2015   Procedure: CYSTOSCOPY, DILATION OF URETHRAL STRICTURE WITH BILATERAL RETROGRADE PYELOGRAM,SUPRAPUBIC TUBE CHANGE;  Surgeon: Festus Aloe, MD;  [facility];  Service: Urology;  Laterality: Bilateral;  . CYSTOSCOPY WITH URETHRAL  DILATATION N/A 12/29/2013   Procedure: CYSTOSCOPY WITH URETHRAL DILATATION;  Surgeon: Marissa Nestle, MD;  [facility];  Service: Urology;  Laterality: N/A;  . ESOPHAGOGASTRODUODENOSCOPY N/A 04/27/2017   Procedure: ESOPHAGOGASTRODUODENOSCOPY (EGD);  Surgeon: Daneil Dolin, MD;  [facility];  Service: Endoscopy;  Laterality: N/A;  . INSERTION OF DIALYSIS CATHETER Right 11/25/2018   Procedure: INSERTION OF DIALYSIS CATHETER RIGHT INTERNAL JUGULAR;  Surgeon: Waynetta Sandy, MD;  Location: [place];  Service: Vascular;  Laterality: Right;  . IR AV DIALY SHUNT INTRO [place] W/PTA/IMG RIGHT Right 09/07/2018  . IR FLUORO GUIDE CV LINE RIGHT  10/16/2020  . IR REMOVAL TUN CV CATH W/O FL  01/12/2019  . IR THROMBECTOMY AV FISTULA W/THROMBOLYSIS/PTA INC/SHUNT/IMG RIGHT Right 04/26/2018  . IR US GUIDE VASC ACCESS RIGHT  04/26/2018  . IR US GUIDE VASC ACCESS RIGHT  09/07/2018  . IR US GUIDE VASC ACCESS RIGHT  10/16/2020  . IR US GUIDE VASC ACCESS RIGHT  10/16/2020  . ORIF FEMUR FRACTURE Right 11/22/2016   Procedure: OPEN REDUCTION INTERNAL FIXATION (ORIF) DISTAL FEMUR FRACTURE;  Surgeon: Rod Can, MD;  Location: [place];  Service: Orthopedics;  Laterality: Right;  . PATCH ANGIOPLASTY Right 12/10/2017   Procedure: PATCH ANGIOPLASTY;  Surgeon: Angelia Mould, MD;  Location: [facility] OR;  Service: Vascular;  Laterality: Right;  . PERIPHERAL VASCULAR BALLOON ANGIOPLASTY  08/13/2018   Procedure: PERIPHERAL VASCULAR BALLOON ANGIOPLASTY;  Surgeon: Elam Dutch, MD;  Location: [place] CV LAB;  Service: Cardiovascular;;  right AV fistula   . PERIPHERAL VASCULAR BALLOON ANGIOPLASTY  11/22/2018   Procedure: PERIPHERAL VASCULAR BALLOON ANGIOPLASTY;  Surgeon: Waynetta Sandy, MD;  Location: [place] CV LAB;  Service: Cardiovascular;;  rt AV fistula  . PERIPHERAL VASCULAR CATHETERIZATION N/A 10/08/2015   Procedure: A/V Shuntogram;  Surgeon: Angelia Mould, MD;  Location: [place] CV LAB;  Service: Cardiovascular;  Laterality: N/A;  . THROMBECTOMY W/ EMBOLECTOMY Right 12/10/2017   Procedure: THROMBECTOMY REVISION RIGHT ARM  ARTERIOVENOUS FISTULA;  Surgeon: Angelia Mould, MD;  Location: [place];  Service: Vascular;  Laterality: Right;  . TRANSURETHRAL RESECTION OF PROSTATE N/A 01/04/2014   Procedure: TRANSURETHRAL RESECTION OF THE PROSTATE (TURP) (procedure #2);  Surgeon: Marissa Nestle, MD;  [facility];  Service: Urology;  Laterality: N/A;    Social History   Socioeconomic History  . Marital status: Single    Spouse name: Not on file  . Number of children: Not on file  . Years of education: Not on file  . Highest education level: Not on file  Occupational History  . Occupation: retired   Tobacco Use  . Smoking status: Never  . Smokeless tobacco: Never  Vaping Use  . Vaping Use: Never used  Substance and Sexual Activity  . Alcohol use: No  . Drug use: No  . Sexual activity: Not Currently  Other Topics Concern  . 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   [facility] resident of [facility]    Social Determinants of Health   Financial Resource Strain: Not on file  Food Insecurity: Not on file  Transportation Needs: Not on file  Physical Activity: Not on file  Stress: Not on file  Social Connections: Not on file  Intimate Partner Violence: Not on file   Family History  Problem Relation Age of Onset  . Cancer Mother   . Colon cancer Neg Hx       VITAL SIGNS BP 109/74   Pulse 88   Temp (!) 97.2 F (36.2 C)   Resp 20   Ht 5\' 8"  (1.727 m)   Wt 218 lb 9.6 oz (99.2 kg)   SpO2 96%   BMI 33.24 kg/m   Outpatient Encounter Medications as of 05/03/2021  Medication Sig  . aspirin EC 81 MG tablet Take 81 mg by mouth daily. for elevated d-dimer  . atorvastatin (LIPITOR) 80 MG tablet Take 80 mg by mouth every evening.  .  Ergocalciferol (VITAMIN D2 PO) Take by mouth. ; 1,250 mcg (50,000 unit); oral Special Instructions: Give once a week x 2 doses  . insulin aspart (NOVOLOG FLEXPEN) 100 UNIT/ML FlexPen Inject 20 Units into the skin daily. If cbg>150; subcutaneous  . insulin aspart (NOVOLOG) 100 UNIT/ML FlexPen Inject 15 Units into the skin 2 (two) times daily. Hold insulin on dialysis days if cbg's less than 150 on mon,wed,fri  . Insulin Glargine (BASAGLAR KWIKPEN) 100 UNIT/ML SOPN Inject 45 Units into the skin at bedtime.   Marland Kitchen JANUVIA 25 MG tablet Take 25 mg by mouth daily.   Marland Kitchen latanoprost (XALATAN) 0.005 % ophthalmic solution Place 1 drop into both eyes at bedtime. 9 pm for glaucoma  . levothyroxine (SYNTHROID) 100 MCG tablet Take 100 mcg by mouth daily before breakfast.  . midodrine (PROAMATINE) 10 MG tablet Take 10 mg by mouth. Send med with resident on Mon/Wed/Fri to dialysis. do not give at [facility] give to resident to take with him. send with 5mg  to equal 15mg .  . midodrine (PROAMATINE) 5 MG tablet Take 5 mg by mouth. Send with resident on dialysis days.  Mon, Wed, Fri.  Take along with 10 mg to equal 15 mg  . Molnupiravir 200 MG CAPS Take 800 mg by mouth 2 (two) times daily.  . Multiple Vitamins-Minerals (ZINC) LOZG Take by mouth. (vit a-vit c-zinc-propolis) lozenge; oral Special Instructions: Give 5x/day x 2 weeks  . NON FORMULARY Diet Type:  NAS, Cons CHO Diet  . omega-3 acid ethyl esters (LOVAZA) 1 g capsule Take 2 g by mouth at bedtime.  Marland Kitchen omeprazole (PRILOSEC) 40 MG capsule Take 40 mg by mouth daily.   . polyethylene glycol (MIRALAX / GLYCOLAX) packet Take 17 g by mouth daily as needed for mild constipation.   . sevelamer carbonate (RENVELA) 800 MG tablet Take 2,400 mg by mouth 3 (three) times daily with meals.  . tamsulosin (FLOMAX) 0.4 MG CAPS capsule Take 0.4 mg by mouth every evening. Give 30 minutes after a meal  . torsemide (DEMADEX) 10 MG tablet Take 5 tablets (50 mg total) by mouth daily.  .  vitamin C (ASCORBIC ACID) 500 MG tablet Take 500 mg by mouth 2 (two) times daily.   No facility-administered encounter medications on file as of 05/03/2021.     SIGNIFICANT DIAGNOSTIC EXAMS  TODAY  05-02-21: Chest x-ray: dialysis cath demonstrated on right. No acute cardiopulmonary process  LABS REVIEWED PREVIOUS;  07-04-20: PSA: 2.79  07-19-20: hgb a1c 7.7 09-10-20: wbc 11.8; hgb 12.9; hct 40.4; mcv 108.6 plt 163; glucose 225; bun 30;creat 8.92; k+ 3.8; na++ 138; ca 9.2 10-12-20: chol 76; ldl 64; trig 319; hdl 24; hgb a1c 7.4  10-17-20: wbc 8.2; hgb 11.0; hct 34.3; mcv 108.2 plt 121; glucose 141; bun 51; creat 13.25; k+ 3.9; na++ 138; ca 8.5 mag 2.1  01-28-21: hgb a1c 6.5  03-07-21: tsh 5.008   TODAY  05-02-21: wbc 3.8; hgb 10.9; hct 33.7; mcv 105.6 plt 106; glucose 105; bun 17; creat 7.94; k+ 3.8; na++ 138; ca 9.1; GFR 7; d-dimer: 0.95; CRP 0.7  Review of Systems  Constitutional:  Negative for malaise/fatigue.  Respiratory:  Negative for cough and shortness of breath.   Cardiovascular:  Negative for chest pain, palpitations and leg swelling.  Gastrointestinal:  Negative for abdominal pain, constipation and heartburn.  Musculoskeletal:  Negative for back pain, joint pain and myalgias.  Skin: Negative.   Neurological:  Negative for dizziness.  Psychiatric/Behavioral:  The patient is not nervous/anxious.    Physical Exam Constitutional:      General: He is not in acute distress.    Appearance: He is well-developed. He is not diaphoretic.  Neck:     Thyroid: No thyromegaly.  Cardiovascular:     Rate and Rhythm: Normal rate and regular rhythm.     Pulses: Normal pulses.     Heart sounds: Normal heart sounds.  Pulmonary:     Effort: Pulmonary effort is normal. No respiratory distress.     Breath sounds: Normal breath sounds.  Abdominal:     General: Bowel sounds are normal. There is no distension.     Palpations: Abdomen is soft.     Tenderness: There is no abdominal tenderness.   Genitourinary:    Comments: Hx turp Musculoskeletal:        General: Normal range of motion.     Cervical back: Neck supple.     Right lower leg: No edema.     Left lower leg: No edema.     Comments: History of right femur ORIF     Lymphadenopathy:     Cervical: No cervical adenopathy.  Skin:    General: Skin is warm and dry.     Comments: Right upper extremity a/v fistula + thrill /bruit      Neurological:     Mental Status: He is alert. Mental status is at baseline.  Psychiatric:        Mood and Affect: Mood normal.     ASSESSMENT/ PLAN:  TODAY  Covid 19 with comorbid diabetes mellitus  Will complete antiviral medication Will begin asa 81 mg daily  Will continue vit c, d, zinc   Ok Edwards NP [facility] Adult Medicine  Contact [phone] Monday through Friday 8am- 5pm  After hours call [phone]

## 2021-05-04 DIAGNOSIS — D509 Iron deficiency anemia, unspecified: Secondary | ICD-10-CM | POA: Diagnosis not present

## 2021-05-04 DIAGNOSIS — D631 Anemia in chronic kidney disease: Secondary | ICD-10-CM | POA: Diagnosis not present

## 2021-05-04 DIAGNOSIS — Z992 Dependence on renal dialysis: Secondary | ICD-10-CM | POA: Diagnosis not present

## 2021-05-04 DIAGNOSIS — N2581 Secondary hyperparathyroidism of renal origin: Secondary | ICD-10-CM | POA: Diagnosis not present

## 2021-05-04 DIAGNOSIS — N186 End stage renal disease: Secondary | ICD-10-CM | POA: Diagnosis not present

## 2021-05-07 DIAGNOSIS — E119 Type 2 diabetes mellitus without complications: Secondary | ICD-10-CM | POA: Insufficient documentation

## 2021-05-07 DIAGNOSIS — Z992 Dependence on renal dialysis: Secondary | ICD-10-CM | POA: Diagnosis not present

## 2021-05-07 DIAGNOSIS — D509 Iron deficiency anemia, unspecified: Secondary | ICD-10-CM | POA: Diagnosis not present

## 2021-05-07 DIAGNOSIS — U071 COVID-19: Secondary | ICD-10-CM | POA: Insufficient documentation

## 2021-05-07 DIAGNOSIS — D631 Anemia in chronic kidney disease: Secondary | ICD-10-CM | POA: Diagnosis not present

## 2021-05-07 DIAGNOSIS — N186 End stage renal disease: Secondary | ICD-10-CM | POA: Diagnosis not present

## 2021-05-07 DIAGNOSIS — N2581 Secondary hyperparathyroidism of renal origin: Secondary | ICD-10-CM | POA: Diagnosis not present

## 2021-05-10 ENCOUNTER — Other Ambulatory Visit: Payer: Self-pay

## 2021-05-10 DIAGNOSIS — N186 End stage renal disease: Secondary | ICD-10-CM

## 2021-05-10 DIAGNOSIS — E1122 Type 2 diabetes mellitus with diabetic chronic kidney disease: Secondary | ICD-10-CM

## 2021-05-11 DIAGNOSIS — Z992 Dependence on renal dialysis: Secondary | ICD-10-CM | POA: Diagnosis not present

## 2021-05-11 DIAGNOSIS — N2581 Secondary hyperparathyroidism of renal origin: Secondary | ICD-10-CM | POA: Diagnosis not present

## 2021-05-11 DIAGNOSIS — N186 End stage renal disease: Secondary | ICD-10-CM | POA: Diagnosis not present

## 2021-05-11 DIAGNOSIS — D509 Iron deficiency anemia, unspecified: Secondary | ICD-10-CM | POA: Diagnosis not present

## 2021-05-11 DIAGNOSIS — D631 Anemia in chronic kidney disease: Secondary | ICD-10-CM | POA: Diagnosis not present

## 2021-05-13 DIAGNOSIS — D631 Anemia in chronic kidney disease: Secondary | ICD-10-CM | POA: Diagnosis not present

## 2021-05-13 DIAGNOSIS — Z992 Dependence on renal dialysis: Secondary | ICD-10-CM | POA: Diagnosis not present

## 2021-05-13 DIAGNOSIS — N2581 Secondary hyperparathyroidism of renal origin: Secondary | ICD-10-CM | POA: Diagnosis not present

## 2021-05-13 DIAGNOSIS — N186 End stage renal disease: Secondary | ICD-10-CM | POA: Diagnosis not present

## 2021-05-13 DIAGNOSIS — D509 Iron deficiency anemia, unspecified: Secondary | ICD-10-CM | POA: Diagnosis not present

## 2021-05-13 DIAGNOSIS — E119 Type 2 diabetes mellitus without complications: Secondary | ICD-10-CM | POA: Diagnosis not present

## 2021-05-15 DIAGNOSIS — N2581 Secondary hyperparathyroidism of renal origin: Secondary | ICD-10-CM | POA: Diagnosis not present

## 2021-05-15 DIAGNOSIS — F4322 Adjustment disorder with anxiety: Secondary | ICD-10-CM | POA: Diagnosis not present

## 2021-05-15 DIAGNOSIS — D631 Anemia in chronic kidney disease: Secondary | ICD-10-CM | POA: Diagnosis not present

## 2021-05-15 DIAGNOSIS — Z992 Dependence on renal dialysis: Secondary | ICD-10-CM | POA: Diagnosis not present

## 2021-05-15 DIAGNOSIS — F70 Mild intellectual disabilities: Secondary | ICD-10-CM | POA: Diagnosis not present

## 2021-05-15 DIAGNOSIS — D509 Iron deficiency anemia, unspecified: Secondary | ICD-10-CM | POA: Diagnosis not present

## 2021-05-15 DIAGNOSIS — N186 End stage renal disease: Secondary | ICD-10-CM | POA: Diagnosis not present

## 2021-05-17 ENCOUNTER — Encounter: Payer: Self-pay | Admitting: Internal Medicine

## 2021-05-17 ENCOUNTER — Ambulatory Visit (INDEPENDENT_AMBULATORY_CARE_PROVIDER_SITE_OTHER): Payer: Medicare Other | Admitting: Vascular Surgery

## 2021-05-17 ENCOUNTER — Inpatient Hospital Stay (HOSPITAL_COMMUNITY)
Admit: 2021-05-17 | Discharge: 2021-05-17 | Disposition: A | Discharge status: Home / Self Care | Payer: No Typology Code available for payment source | Attending: Vascular Surgery | Admitting: Vascular Surgery

## 2021-05-17 ENCOUNTER — Encounter: Payer: Self-pay | Admitting: Vascular Surgery

## 2021-05-17 ENCOUNTER — Other Ambulatory Visit: Payer: Self-pay

## 2021-05-17 VITALS — BP 87/55 | HR 103 | Temp 98.6°F | Resp 20 | Ht 68.0 in | Wt 218.0 lb

## 2021-05-17 DIAGNOSIS — D509 Iron deficiency anemia, unspecified: Secondary | ICD-10-CM | POA: Diagnosis not present

## 2021-05-17 DIAGNOSIS — Z0000 Encounter for general adult medical examination without abnormal findings: Secondary | ICD-10-CM

## 2021-05-17 DIAGNOSIS — D631 Anemia in chronic kidney disease: Secondary | ICD-10-CM | POA: Diagnosis not present

## 2021-05-17 DIAGNOSIS — Z992 Dependence on renal dialysis: Secondary | ICD-10-CM

## 2021-05-17 DIAGNOSIS — N186 End stage renal disease: Secondary | ICD-10-CM | POA: Diagnosis not present

## 2021-05-17 DIAGNOSIS — E1122 Type 2 diabetes mellitus with diabetic chronic kidney disease: Secondary | ICD-10-CM | POA: Diagnosis not present

## 2021-05-17 DIAGNOSIS — N2581 Secondary hyperparathyroidism of renal origin: Secondary | ICD-10-CM | POA: Diagnosis not present

## 2021-05-17 NOTE — Progress Notes (Signed)
   NURSING HOME LOCATION:  [facility] ROOM NUMBER:    CODE STATUS:    PCP:  Ok Edwards NP  This is a nursing facility follow up visit of chronic medical diagnoses & to document compliance with Regulation 483.30 (c) in [employer] [place] Manual Phase 2 which mandates caregiver visit ( visits can alternate among physician, PA or NP as per statutes) within 10 days of 30 days / 60 days/ 90 days post admission to SNF date    Interim medical record and care since last SNF visit was updated with review of diagnostic studies and change in clinical status since last visit were documented.  HPI:  Review of systems: Dementia invalidated responses. Date given as   Constitutional: No fever, significant weight change, fatigue  Eyes: No redness, discharge, pain, vision change ENT/mouth: No nasal congestion,  purulent discharge, earache, change in hearing, sore throat  Cardiovascular: No chest pain, palpitations, paroxysmal nocturnal dyspnea, claudication, edema  Respiratory: No cough, sputum production, hemoptysis, DOE, significant snoring, apnea   Gastrointestinal: No heartburn, dysphagia, abdominal pain, nausea /vomiting, rectal bleeding, melena, change in bowels Genitourinary: No dysuria, hematuria, pyuria, incontinence, nocturia Musculoskeletal: No joint stiffness, joint swelling, weakness, pain Dermatologic: No rash, pruritus, change in appearance of skin Neurologic: No dizziness, headache, syncope, seizures, numbness, tingling Psychiatric: No significant anxiety, depression, insomnia, anorexia Endocrine: No change in hair/skin/nails, excessive thirst, excessive hunger, excessive urination  Hematologic/lymphatic: No significant bruising, lymphadenopathy, abnormal bleeding Allergy/immunology: No itchy/watery eyes, significant sneezing, urticaria, angioedema  Physical exam:  Pertinent or positive findings: General appearance: Adequately nourished; no acute distress,  increased work of breathing is present.   Lymphatic: No lymphadenopathy about the head, neck, axilla. Eyes: No conjunctival inflammation or lid edema is present. There is no scleral icterus. Ears:  External ear exam shows no significant lesions or deformities.   Nose:  External nasal examination shows no deformity or inflammation. Nasal mucosa are pink and moist without lesions, exudates Oral exam:  Lips and gums are healthy appearing. There is no oropharyngeal erythema or exudate. Neck:  No thyromegaly, masses, tenderness noted.    Heart:  Normal rate and regular rhythm. S1 and S2 normal without gallop, murmur, click, rub .  Lungs: Chest clear to auscultation without wheezes, rhonchi, rales, rubs. Abdomen: Bowel sounds are normal. Abdomen is soft and nontender with no organomegaly, hernias, masses. GU: Deferred  Extremities:  No cyanosis, clubbing, edema  Neurologic exam : Cn 2-7 intact Strength equal  in upper & lower extremities Balance, Rhomberg, finger to nose testing could not be completed due to clinical state Deep tendon reflexes are equal Skin: Warm & dry w/o tenting. No significant lesions or rash.  See summary under each active problem in the Problem List with associated updated therapeutic plan

## 2021-05-17 NOTE — H&P (View-Only) (Signed)
Patient ID: Zachary [name], male   DOB: June 25, 1946, 75 y.o.   MRN: [id number]  Reason for Consult: Follow-up   Referred by Gerlene Fee, NP  Subjective:     HPI:  Zachary [name] is a 75 y.o. 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  He is now here for discussion of further dialysis access.  He is currently dialyzing via catheter.  He is right-hand dominant.  He is a ward of the state he is accompanied by a Education officer, museum.  She states that the catheter is working well.  He takes aspirin no blood thinners.  Past Medical History:  Diagnosis Date   Abnormal CT scan, kidney 10/06/2011   Acute pyelonephritis 10/07/2011   Anemia    normocytic   Anxiety    mental retardation   Bladder wall thickening 10/06/2011   BPH (benign prostatic hypertrophy)    Diabetes mellitus    Dialysis patient ([place])    Tuesday, Thursday and Saturday,    DVT of leg (deep venous thrombosis) ([place]) 12/25/2016   Edema     history of lower extremity edema   GERD (gastroesophageal reflux disease)    Heme positive stool    Hydronephrosis    Hyperkalemia    Hyperlipidemia    Hypernatremia    Hypertension    Hypothyroidism    Impaired speech    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HCC)    MR (mental retardation)    Muscle weakness    Obstructive uropathy    Perinephric abscess 10/07/2011   Poor historian poor historian   Protein calorie malnutrition (HCC)    Pyelonephritis    Renal failure (ARF), acute on chronic (HCC)    Renal insufficiency    chronic history   Sepsis ([place])    Smoking    Uremia    Urinary retention    UTI (lower urinary tract infection) 10/06/2011   Family History  Problem Relation Age of Onset   Cancer Mother    Colon cancer Neg Hx    Past Surgical History:  Procedure Laterality Date   A/V FISTULAGRAM N/A 08/13/2018   Procedure: A/V FISTULAGRAM - Right Upper;  Surgeon: Elam Dutch, MD;  Location: [facility];  Service: Cardiovascular;   Laterality: N/A;   A/V FISTULAGRAM N/A 11/22/2018   Procedure: A/V FISTULAGRAM - Right Upper;  Surgeon: Waynetta Sandy, MD;  Location: [facility];  Service: Cardiovascular;  Laterality: N/A;   AV FISTULA PLACEMENT Left 07/06/2015   Procedure:  INSERTION LEFT ARM ARTERIOVENOUS GORTEX GRAFT;  Surgeon: Angelia Mould, MD;  Location: [place];  Service: Vascular;  Laterality: Left;   AV FISTULA PLACEMENT Right 02/26/2016   Procedure: ARTERIOVENOUS (AV) FISTULA CREATION ;  Surgeon: Angelia Mould, MD;  Location: [place];  Service: Vascular;  Laterality: Right;   AV FISTULA PLACEMENT Right 11/25/2018   Procedure: INSERTION OF ARTERIOVENOUS (AV) ARTEGRAFT RIGHT UPPER ARM;  Surgeon: Waynetta Sandy, MD;  Location: [place];  Service: Vascular;  Laterality: Right;   [place] REMOVAL Left 10/09/2015   Procedure: REMOVAL OF ARTERIOVENOUS GORETEX GRAFT ([place]) Evacuation of Lymphocele, Vein Patch angioplasty of brachial artery.;  Surgeon: Angelia Mould, MD;  Location: [place];  Service: Vascular;  Laterality: Left;   [place] Right 02/26/2016   Procedure: Right [place];  Surgeon: Angelia Mould, MD;  Location: [place];  Service: Vascular;  Laterality: Right;   CIRCUMCISION N/A 01/04/2014  Procedure: CIRCUMCISION ADULT (procedure #1);  Surgeon: Marissa Nestle, MD;  Location: [facility];  Service: Urology;  Laterality: N/A;   COLECTOMY N/A 05/04/2017   Procedure: TOTAL COLECTOMY;  Surgeon: Aviva Signs, MD;  Location: [facility];  Service: General;  Laterality: N/A;   COLONOSCOPY N/A 04/27/2017   Procedure: COLONOSCOPY;  Surgeon: Daneil Dolin, MD;  [facility];  Service: Endoscopy;  Laterality: N/A;  245   CYSTOSCOPY W/ RETROGRADES Bilateral 06/29/2015   Procedure: CYSTOSCOPY, DILATION OF URETHRAL STRICTURE WITH BILATERAL RETROGRADE PYELOGRAM,SUPRAPUBIC TUBE CHANGE;  Surgeon: Festus Aloe, MD;  Location: [facility];  Service: Urology;   Laterality: Bilateral;   CYSTOSCOPY WITH URETHRAL DILATATION N/A 12/29/2013   Procedure: CYSTOSCOPY WITH URETHRAL DILATATION;  Surgeon: Marissa Nestle, MD;  [facility];  Service: Urology;  Laterality: N/A;   ESOPHAGOGASTRODUODENOSCOPY N/A 04/27/2017   Procedure: ESOPHAGOGASTRODUODENOSCOPY (EGD);  Surgeon: Daneil Dolin, MD;  [facility];  Service: Endoscopy;  Laterality: N/A;   INSERTION OF DIALYSIS CATHETER Right 11/25/2018   Procedure: INSERTION OF DIALYSIS CATHETER RIGHT INTERNAL JUGULAR;  Surgeon: Waynetta Sandy, MD;  Location: [facility];  Service: Vascular;  Laterality: Right;   IR AV DIALY SHUNT INTRO NEEDLE/INTRACATH INITIAL W/PTA/IMG RIGHT Right 09/07/2018   IR FLUORO GUIDE CV LINE RIGHT  10/16/2020   IR REMOVAL TUN CV CATH W/O FL  01/12/2019   IR THROMBECTOMY AV FISTULA W/THROMBOLYSIS/PTA INC/SHUNT/IMG RIGHT Right 04/26/2018   IR US GUIDE VASC ACCESS RIGHT  04/26/2018   IR US GUIDE VASC ACCESS RIGHT  09/07/2018   IR US GUIDE VASC ACCESS RIGHT  10/16/2020   IR US GUIDE VASC ACCESS RIGHT  10/16/2020   ORIF FEMUR FRACTURE Right 11/22/2016   Procedure: OPEN REDUCTION INTERNAL FIXATION (ORIF) DISTAL FEMUR FRACTURE;  Surgeon: Rod Can, MD;  Location: [place];  Service: Orthopedics;  Laterality: Right;   PATCH ANGIOPLASTY Right 12/10/2017   Procedure: PATCH ANGIOPLASTY;  Surgeon: Angelia Mould, MD;  Location: [place];  Service: Vascular;  Laterality: Right;   PERIPHERAL VASCULAR BALLOON ANGIOPLASTY  08/13/2018   Procedure: PERIPHERAL VASCULAR BALLOON ANGIOPLASTY;  Surgeon: Elam Dutch, MD;  Location: [facility];  Service: Cardiovascular;;  right AV fistula    PERIPHERAL VASCULAR BALLOON ANGIOPLASTY  11/22/2018   Procedure: PERIPHERAL VASCULAR BALLOON ANGIOPLASTY;  Surgeon: Waynetta Sandy, MD;  Location: [facility];  Service: Cardiovascular;;  rt AV fistula   PERIPHERAL VASCULAR CATHETERIZATION N/A 10/08/2015   Procedure: A/V  Shuntogram;  Surgeon: Angelia Mould, MD;  Location: [facility];  Service: Cardiovascular;  Laterality: N/A;   THROMBECTOMY W/ EMBOLECTOMY Right 12/10/2017   Procedure: THROMBECTOMY REVISION RIGHT ARM  ARTERIOVENOUS FISTULA;  Surgeon: Angelia Mould, MD;  Location: [facility];  Service: Vascular;  Laterality: Right;   TRANSURETHRAL RESECTION OF PROSTATE N/A 01/04/2014   Procedure: TRANSURETHRAL RESECTION OF THE PROSTATE (TURP) (procedure #2);  Surgeon: Marissa Nestle, MD;  [facility];  Service: Urology;  Laterality: N/A;    Short Social History:  Social History   Tobacco Use   Smoking status: Never   Smokeless tobacco: Never  Substance Use Topics   Alcohol use: No    Allergies  Allergen Reactions   No Known Allergies     No current outpatient medications on file.   No current facility-administered medications for this visit.    Review of Systems Unable to perform ROS      Objective:  Objective  Vitals:  05/17/21 1532  BP: (!) 87/55  Pulse: (!) 103  Resp: 20  Temp: 98.6 F (37 C)  SpO2: 94%    Physical Exam HENT:     Head: Normocephalic.     Nose:     Comments: Wearing a mask Eyes:     Pupils: Pupils are equal, round, and reactive to light.  Cardiovascular:     Pulses:          Radial pulses are 2+ on the right side and 2+ on the left side.  Pulmonary:     Effort: Pulmonary effort is normal.  Abdominal:     General: Abdomen is flat.  Musculoskeletal:        General: Normal range of motion.     Cervical back: Normal range of motion and neck supple.  Skin:    General: Skin is warm.     Capillary Refill: Capillary refill takes less than 2 seconds.  Neurological:     Mental Status: He is alert.  Psychiatric:        Mood and Affect: Mood normal.        Behavior: Behavior normal.        Thought Content: Thought content normal.        Judgment: Judgment normal.    Data: +-----------------+-------------+----------+--------+   Right Cephalic   Diameter (cm)Depth (cm)Findings  +-----------------+-------------+----------+--------+  Shoulder             0.64                         +-----------------+-------------+----------+--------+  Prox upper arm       0.35                         +-----------------+-------------+----------+--------+  Mid upper arm        0.38                         +-----------------+-------------+----------+--------+  Dist upper arm       0.34                         +-----------------+-------------+----------+--------+  Antecubital fossa    0.31                         +-----------------+-------------+----------+--------+   +-----------------+-------------+----------+-----------------+  Left Cephalic    Diameter (cm)Depth (cm)    Findings       +-----------------+-------------+----------+-----------------+  Shoulder             0.31                                  +-----------------+-------------+----------+-----------------+  Prox upper arm       0.26                                  +-----------------+-------------+----------+-----------------+  Mid upper arm        0.23                                  +-----------------+-------------+----------+-----------------+  Dist upper arm       0.24                                  +-----------------+-------------+----------+-----------------+  Antecubital fossa    0.27                                  +-----------------+-------------+----------+-----------------+  Prox forearm                            chronic occlusion  +-----------------+-------------+----------+-----------------+  Mid forearm                             chronic occlusion  +-----------------+-------------+----------+-----------------+   +-----------------+-------------+----------+---------+  Left Basilic     Diameter (cm)Depth (cm)Findings    +-----------------+-------------+----------+---------+  Dist upper arm       0.49                          +-----------------+-------------+----------+---------+  Antecubital fossa    0.33               branching  +-----------------+-------------+----------+---------+   Summary: Right: Patent cephalic vein in the upper arm. Basilic used for         prior AVF.  Left: Patent cephalic vein in the upper arm. 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 Patent basilic vein.    Right Pre-Dialysis Findings:  +-----------------------+----------+--------------------+---------+--------  +  Location               PSV (cm/s)Intralum. Diam. (cm)Waveform  Comments  +-----------------------+----------+--------------------+---------+--------  +  Brachial Antecub. fossa69        0.52                triphasic            +-----------------------+----------+--------------------+---------+--------  +  Radial Art at Wrist    75        0.24                triphasic            +-----------------------+----------+--------------------+---------+--------  +  Ulnar Art at Wrist     39        0.17                triphasic            +-----------------------+----------+--------------------+---------+--------  +    Left Pre-Dialysis Findings:  +-----------------------+----------+--------------------+---------+--------  +  Location               PSV (cm/s)Intralum. Diam. (cm)Waveform  Comments  +-----------------------+----------+--------------------+---------+--------  +  Brachial Antecub. fossa60        0.53                triphasic            +-----------------------+----------+--------------------+---------+--------  +  Radial Art at Wrist    73        0.25                triphasic            +-----------------------+----------+--------------------+---------+--------  +  Ulnar Art at Wrist     63        0.17                 triphasic            +-----------------------+----------+--------------------+---------+--------  +       Assessment/Plan:     75 year old male with  failed right upper extremity access.  He is now on dialysis via catheter.  He is right-hand dominant.  I evaluated his veins in the office personally appears that he may have a basilic vein on the left side above the antecubitum.  His cephalic vein appears to have been used for fistula in the past on the left.  Basilic vein on the right appears very diminutive.  We will plan for left arm AV fistula versus graft in the near future.  As patient stays at [facility] I have offered him operation with Dr. Donnetta Hutching at [facility] and he is agreeable to this.     Waynetta Sandy MD [facility]

## 2021-05-17 NOTE — Progress Notes (Signed)
Patient ID: Zachary [name], male   DOB: 10-Jun-1946, 75 y.o.   MRN: [id number]  Reason for Consult: Follow-up   Referred by Gerlene Fee, NP  Subjective:     HPI:  Zachary [name] is a 75 y.o. 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  He is now here for discussion of further dialysis access.  He is currently dialyzing via catheter.  He is right-hand dominant.  He is a ward of the state he is accompanied by a Education officer, museum.  She states that the catheter is working well.  He takes aspirin no blood thinners.  Past Medical History:  Diagnosis Date   Abnormal CT scan, kidney 10/06/2011   Acute pyelonephritis 10/07/2011   Anemia    normocytic   Anxiety    mental retardation   Bladder wall thickening 10/06/2011   BPH (benign prostatic hypertrophy)    Diabetes mellitus    Dialysis patient ([place])    Tuesday, Thursday and Saturday,    DVT of leg (deep venous thrombosis) ([place]) 12/25/2016   Edema     history of lower extremity edema   GERD (gastroesophageal reflux disease)    Heme positive stool    Hydronephrosis    Hyperkalemia    Hyperlipidemia    Hypernatremia    Hypertension    Hypothyroidism    Impaired speech    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HCC)    MR (mental retardation)    Muscle weakness    Obstructive uropathy    Perinephric abscess 10/07/2011   Poor historian poor historian   Protein calorie malnutrition (HCC)    Pyelonephritis    Renal failure (ARF), acute on chronic (HCC)    Renal insufficiency    chronic history   Sepsis ([place])    Smoking    Uremia    Urinary retention    UTI (lower urinary tract infection) 10/06/2011   Family History  Problem Relation Age of Onset   Cancer Mother    Colon cancer Neg Hx    Past Surgical History:  Procedure Laterality Date   A/V FISTULAGRAM N/A 08/13/2018   Procedure: A/V FISTULAGRAM - Right Upper;  Surgeon: Elam Dutch, MD;  Location: [place] [facility];  Service: Cardiovascular;   Laterality: N/A;   A/V FISTULAGRAM N/A 11/22/2018   Procedure: A/V FISTULAGRAM - Right Upper;  Surgeon: Waynetta Sandy, MD;  Location: [place] [facility];  Service: Cardiovascular;  Laterality: N/A;   AV FISTULA PLACEMENT Left 07/06/2015   Procedure:  INSERTION LEFT ARM ARTERIOVENOUS GORTEX GRAFT;  Surgeon: Angelia Mould, MD;  Location: [place];  Service: Vascular;  Laterality: Left;   AV FISTULA PLACEMENT Right 02/26/2016   Procedure: ARTERIOVENOUS (AV) FISTULA CREATION ;  Surgeon: Angelia Mould, MD;  Location: [place];  Service: Vascular;  Laterality: Right;   AV FISTULA PLACEMENT Right 11/25/2018   Procedure: INSERTION OF ARTERIOVENOUS (AV) ARTEGRAFT RIGHT UPPER ARM;  Surgeon: Waynetta Sandy, MD;  Location: [place];  Service: Vascular;  Laterality: Right;   [place] REMOVAL Left 10/09/2015   Procedure: REMOVAL OF ARTERIOVENOUS GORETEX GRAFT ([place]) Evacuation of Lymphocele, Vein Patch angioplasty of brachial artery.;  Surgeon: Angelia Mould, MD;  Location: [place];  Service: Vascular;  Laterality: Left;   [place] Right 02/26/2016   Procedure: Right [place];  Surgeon: Angelia Mould, MD;  Location: [place];  Service: Vascular;  Laterality: Right;   CIRCUMCISION N/A 01/04/2014  Procedure: CIRCUMCISION ADULT (procedure #1);  Surgeon: Marissa Nestle, MD;  Location: [facility];  Service: Urology;  Laterality: N/A;   COLECTOMY N/A 05/04/2017   Procedure: TOTAL COLECTOMY;  Surgeon: Aviva Signs, MD;  Location: [facility];  Service: General;  Laterality: N/A;   COLONOSCOPY N/A 04/27/2017   Procedure: COLONOSCOPY;  Surgeon: Daneil Dolin, MD;  [facility];  Service: Endoscopy;  Laterality: N/A;  245   CYSTOSCOPY W/ RETROGRADES Bilateral 06/29/2015   Procedure: CYSTOSCOPY, DILATION OF URETHRAL STRICTURE WITH BILATERAL RETROGRADE PYELOGRAM,SUPRAPUBIC TUBE CHANGE;  Surgeon: Festus Aloe, MD;  Location: [facility];  Service: Urology;   Laterality: Bilateral;   CYSTOSCOPY WITH URETHRAL DILATATION N/A 12/29/2013   Procedure: CYSTOSCOPY WITH URETHRAL DILATATION;  Surgeon: Marissa Nestle, MD;  [facility];  Service: Urology;  Laterality: N/A;   ESOPHAGOGASTRODUODENOSCOPY N/A 04/27/2017   Procedure: ESOPHAGOGASTRODUODENOSCOPY (EGD);  Surgeon: Daneil Dolin, MD;  [facility];  Service: Endoscopy;  Laterality: N/A;   INSERTION OF DIALYSIS CATHETER Right 11/25/2018   Procedure: INSERTION OF DIALYSIS CATHETER RIGHT INTERNAL JUGULAR;  Surgeon: Waynetta Sandy, MD;  Location: [place];  Service: Vascular;  Laterality: Right;   IR AV DIALY SHUNT INTRO NEEDLE/INTRACATH INITIAL W/PTA/IMG RIGHT Right 09/07/2018   IR FLUORO GUIDE CV LINE RIGHT  10/16/2020   IR REMOVAL TUN CV CATH W/O FL  01/12/2019   IR THROMBECTOMY AV FISTULA W/THROMBOLYSIS/PTA INC/SHUNT/IMG RIGHT Right 04/26/2018   IR US GUIDE VASC ACCESS RIGHT  04/26/2018   IR US GUIDE VASC ACCESS RIGHT  09/07/2018   IR US GUIDE VASC ACCESS RIGHT  10/16/2020   IR US GUIDE VASC ACCESS RIGHT  10/16/2020   ORIF FEMUR FRACTURE Right 11/22/2016   Procedure: OPEN REDUCTION INTERNAL FIXATION (ORIF) DISTAL FEMUR FRACTURE;  Surgeon: Rod Can, MD;  Location: [place];  Service: Orthopedics;  Laterality: Right;   PATCH ANGIOPLASTY Right 12/10/2017   Procedure: PATCH ANGIOPLASTY;  Surgeon: Angelia Mould, MD;  Location: [place];  Service: Vascular;  Laterality: Right;   PERIPHERAL VASCULAR BALLOON ANGIOPLASTY  08/13/2018   Procedure: PERIPHERAL VASCULAR BALLOON ANGIOPLASTY;  Surgeon: Elam Dutch, MD;  Location: [place] [facility];  Service: Cardiovascular;;  right AV fistula    PERIPHERAL VASCULAR BALLOON ANGIOPLASTY  11/22/2018   Procedure: PERIPHERAL VASCULAR BALLOON ANGIOPLASTY;  Surgeon: Waynetta Sandy, MD;  Location: [facility];  Service: Cardiovascular;;  rt AV fistula   PERIPHERAL VASCULAR CATHETERIZATION N/A 10/08/2015   Procedure: A/V  Shuntogram;  Surgeon: Angelia Mould, MD;  Location: [place] [facility];  Service: Cardiovascular;  Laterality: N/A;   THROMBECTOMY W/ EMBOLECTOMY Right 12/10/2017   Procedure: THROMBECTOMY REVISION RIGHT ARM  ARTERIOVENOUS FISTULA;  Surgeon: Angelia Mould, MD;  [facility];  Service: Vascular;  Laterality: Right;   TRANSURETHRAL RESECTION OF PROSTATE N/A 01/04/2014   Procedure: TRANSURETHRAL RESECTION OF THE PROSTATE (TURP) (procedure #2);  Surgeon: Marissa Nestle, MD;  Location: [facility];  Service: Urology;  Laterality: N/A;    Short Social History:  Social History   Tobacco Use   Smoking status: Never   Smokeless tobacco: Never  Substance Use Topics   Alcohol use: No    Allergies  Allergen Reactions   No Known Allergies     No current outpatient medications on file.   No current facility-administered medications for this visit.    Review of Systems Unable to perform ROS      Objective:  Objective  Vitals:  05/17/21 1532  BP: (!) 87/55  Pulse: (!) 103  Resp: 20  Temp: 98.6 F (37 C)  SpO2: 94%    Physical Exam HENT:     Head: Normocephalic.     Nose:     Comments: Wearing a mask Eyes:     Pupils: Pupils are equal, round, and reactive to light.  Cardiovascular:     Pulses:          Radial pulses are 2+ on the right side and 2+ on the left side.  Pulmonary:     Effort: Pulmonary effort is normal.  Abdominal:     General: Abdomen is flat.  Musculoskeletal:        General: Normal range of motion.     Cervical back: Normal range of motion and neck supple.  Skin:    General: Skin is warm.     Capillary Refill: Capillary refill takes less than 2 seconds.  Neurological:     Mental Status: He is alert.  Psychiatric:        Mood and Affect: Mood normal.        Behavior: Behavior normal.        Thought Content: Thought content normal.        Judgment: Judgment normal.    Data: +-----------------+-------------+----------+--------+   Right Cephalic   Diameter (cm)Depth (cm)Findings  +-----------------+-------------+----------+--------+  Shoulder             0.64                         +-----------------+-------------+----------+--------+  Prox upper arm       0.35                         +-----------------+-------------+----------+--------+  Mid upper arm        0.38                         +-----------------+-------------+----------+--------+  Dist upper arm       0.34                         +-----------------+-------------+----------+--------+  Antecubital fossa    0.31                         +-----------------+-------------+----------+--------+   +-----------------+-------------+----------+-----------------+  Left Cephalic    Diameter (cm)Depth (cm)    Findings       +-----------------+-------------+----------+-----------------+  Shoulder             0.31                                  +-----------------+-------------+----------+-----------------+  Prox upper arm       0.26                                  +-----------------+-------------+----------+-----------------+  Mid upper arm        0.23                                  +-----------------+-------------+----------+-----------------+  Dist upper arm       0.24                                  +-----------------+-------------+----------+-----------------+  Antecubital fossa    0.27                                  +-----------------+-------------+----------+-----------------+  Prox forearm                            chronic occlusion  +-----------------+-------------+----------+-----------------+  Mid forearm                             chronic occlusion  +-----------------+-------------+----------+-----------------+   +-----------------+-------------+----------+---------+  Left Basilic     Diameter (cm)Depth (cm)Findings    +-----------------+-------------+----------+---------+  Dist upper arm       0.49                          +-----------------+-------------+----------+---------+  Antecubital fossa    0.33               branching  +-----------------+-------------+----------+---------+   Summary: Right: Patent cephalic vein in the upper arm. Basilic used for         prior AVF.  Left: Patent cephalic vein in the upper arm. 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 Patent basilic vein.    Right Pre-Dialysis Findings:  +-----------------------+----------+--------------------+---------+--------  +  Location               PSV (cm/s)Intralum. Diam. (cm)Waveform  Comments  +-----------------------+----------+--------------------+---------+--------  +  Brachial Antecub. fossa69        0.52                triphasic            +-----------------------+----------+--------------------+---------+--------  +  Radial Art at Wrist    75        0.24                triphasic            +-----------------------+----------+--------------------+---------+--------  +  Ulnar Art at Wrist     39        0.17                triphasic            +-----------------------+----------+--------------------+---------+--------  +    Left Pre-Dialysis Findings:  +-----------------------+----------+--------------------+---------+--------  +  Location               PSV (cm/s)Intralum. Diam. (cm)Waveform  Comments  +-----------------------+----------+--------------------+---------+--------  +  Brachial Antecub. fossa60        0.53                triphasic            +-----------------------+----------+--------------------+---------+--------  +  Radial Art at Wrist    73        0.25                triphasic            +-----------------------+----------+--------------------+---------+--------  +  Ulnar Art at Wrist     63        0.17                 triphasic            +-----------------------+----------+--------------------+---------+--------  +       Assessment/Plan:     75 year old male with  failed right upper extremity access.  He is now on dialysis via catheter.  He is right-hand dominant.  I evaluated his veins in the office personally appears that he may have a basilic vein on the left side above the antecubitum.  His cephalic vein appears to have been used for fistula in the past on the left.  Basilic vein on the right appears very diminutive.  We will plan for left arm AV fistula versus graft in the near future.  As patient stays at [facility] I have offered him operation with Dr. Donnetta Hutching at [facility] and he is agreeable to this.     Waynetta Sandy MD [facility]

## 2021-05-17 NOTE — Progress Notes (Signed)
This encounter was created in error - please disregard.

## 2021-05-20 DIAGNOSIS — Z992 Dependence on renal dialysis: Secondary | ICD-10-CM | POA: Diagnosis not present

## 2021-05-20 DIAGNOSIS — N186 End stage renal disease: Secondary | ICD-10-CM | POA: Diagnosis not present

## 2021-05-20 DIAGNOSIS — D509 Iron deficiency anemia, unspecified: Secondary | ICD-10-CM | POA: Diagnosis not present

## 2021-05-20 DIAGNOSIS — N2581 Secondary hyperparathyroidism of renal origin: Secondary | ICD-10-CM | POA: Diagnosis not present

## 2021-05-20 DIAGNOSIS — D631 Anemia in chronic kidney disease: Secondary | ICD-10-CM | POA: Diagnosis not present

## 2021-05-20 NOTE — Progress Notes (Signed)
This encounter was created in error - please disregard.

## 2021-05-21 ENCOUNTER — Non-Acute Institutional Stay (SKILLED_NURSING_FACILITY): Payer: Medicare Other | Admitting: Internal Medicine

## 2021-05-21 ENCOUNTER — Encounter: Payer: Self-pay | Admitting: Internal Medicine

## 2021-05-21 DIAGNOSIS — U071 COVID-19: Secondary | ICD-10-CM

## 2021-05-21 DIAGNOSIS — D631 Anemia in chronic kidney disease: Secondary | ICD-10-CM

## 2021-05-21 DIAGNOSIS — E034 Atrophy of thyroid (acquired): Secondary | ICD-10-CM | POA: Diagnosis not present

## 2021-05-21 DIAGNOSIS — I120 Hypertensive chronic kidney disease with stage 5 chronic kidney disease or end stage renal disease: Secondary | ICD-10-CM | POA: Diagnosis not present

## 2021-05-21 DIAGNOSIS — N186 End stage renal disease: Secondary | ICD-10-CM | POA: Diagnosis not present

## 2021-05-21 DIAGNOSIS — Z992 Dependence on renal dialysis: Secondary | ICD-10-CM

## 2021-05-21 DIAGNOSIS — E1122 Type 2 diabetes mellitus with diabetic chronic kidney disease: Secondary | ICD-10-CM | POA: Diagnosis not present

## 2021-05-21 NOTE — Assessment & Plan Note (Addendum)
Labs are monitored at hemodialysis Monday, Wednesday, and Friday.  Most recent creatinine/GFR @ SNF were on 05/02/2021 with creatinine of 7.94 and GFR of 7. Continue M,W & F HD

## 2021-05-21 NOTE — Assessment & Plan Note (Addendum)
Accuchecks ac & qhs 60-226 Current A1c 01/28/2021 was 6.5% down from a value of 7.4%.  No change in therapy is indicated.  Discordance would be expected with his ESRD.

## 2021-05-21 NOTE — Assessment & Plan Note (Signed)
Asymptomatic; O2 sats 99% on RA. Afebrile.

## 2021-05-21 NOTE — Assessment & Plan Note (Signed)
Current TSH is therapeutic; no change in L-thyroxine dose indicated.

## 2021-05-21 NOTE — Progress Notes (Signed)
NURSING HOME LOCATION: [facility] ROOM NUMBER:    CODE STATUS:  Full Code  PCP:  Ok Edwards NP  This is a nursing facility follow up visit of chronic medical diagnoses & to document compliance with Regulation 483.30 (c) in [employer] [place] Manual Phase 2 which mandates caregiver visit ( visits can alternate among physician, PA or NP as per statutes) within 10 days of 30 days / 60 days/ 90 days post admission to SNF date    Interim medical record and care since last SNF visit was updated with review of diagnostic studies and change in clinical status since last visit were documented.  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 Surgeries and procedures include colectomy in 2018 for colon cancer, TURP, as well as multiple vascular procedures related to his ESRD.  Review of systems: He exhibited dysarthria and all replies were essentially monosyllabic.  He denied any active symptoms in relationship to COVID, bleeding dyscrasias, or GI symptoms despite the positive labs.  Constitutional: No fever, significant weight change, fatigue  Eyes: No redness, discharge, pain, vision change ENT/mouth: No nasal congestion,  purulent discharge, earache, change in hearing, sore throat  Cardiovascular: No chest pain, palpitations, paroxysmal nocturnal dyspnea, claudication, edema  Respiratory: No cough, sputum production, hemoptysis, DOE, significant snoring, apnea   Gastrointestinal: No heartburn, dysphagia, abdominal pain, nausea /vomiting, rectal bleeding, melena, change in bowels Genitourinary: No dysuria, hematuria, pyuria, incontinence, nocturia Musculoskeletal: No joint stiffness, joint swelling, weakness, pain Dermatologic: No rash, pruritus, change in appearance  of skin Neurologic: No dizziness, headache, syncope, seizures, numbness, tingling Psychiatric: No significant anxiety, depression, insomnia, anorexia Endocrine: No change in hair/skin/nails, excessive thirst, excessive hunger, excessive urination  Hematologic/lymphatic: No significant bruising, lymphadenopathy, abnormal bleeding Allergy/immunology: No itchy/watery eyes, significant sneezing, urticaria, angioedema  Physical exam:  Pertinent or positive findings: As noted he does exhibit some dysarthria.  His responses were essentially monosyllabic and all negative in relationship to the review of systems.  Ptosis is present, greater on the right.  There is slight asymmetry of the nasolabial folds with the left being decreased compared to the right.  He is missing multiple teeth and has dense plaque.  Breath sounds are decreased.  There is a grade 1/2 systolic murmur at the base.  Abdomen is protuberant.  Pedal pulses are decreased.  Shunt is present in the right biceps area.  There is hyperpigmentation of the right thumbnail and third  fingernail and the left fourth fingernail.  Strength to opposition is good.  He did have difficulty following commands however.  General appearance: Adequately nourished; no acute distress, increased work of breathing is present.   Lymphatic: No lymphadenopathy about the head, neck, axilla. Eyes: No conjunctival inflammation or lid edema is present. There is no scleral icterus. Ears:  External ear exam shows no significant lesions or deformities.   Nose:  External nasal examination shows no deformity or inflammation. Nasal mucosa are pink and moist without lesions, exudates Neck:  No thyromegaly, masses, tenderness noted.    Heart:  Normal rate and regular rhythm. S1 and S2 normal without gallop, click, rub .  Lungs:  without wheezes, rhonchi, rales, rubs. Abdomen: Bowel sounds are normal. Abdomen is soft and nontender with no organomegaly, hernias, masses. GU:  Deferred  Extremities:  No cyanosis, clubbing, edema  Neurologic  exam :Balance, Rhomberg, finger to nose testing could not be completed due to clinical state Skin: Warm & dry w/o tenting. No significant lesions or rash.  See summary under each active problem in the Problem List with associated updated therapeutic plan

## 2021-05-21 NOTE — Patient Instructions (Signed)
See assessment and plan under each diagnosis in the problem list and acutely for this visit 

## 2021-05-21 NOTE — Assessment & Plan Note (Signed)
05/02/2021 H/H 10.9/33.7 with prior values 12.1/36.5.  With history of colon cancer; monitor will be necessary.  If progressive, FOBT will be collected.

## 2021-05-22 DIAGNOSIS — N2581 Secondary hyperparathyroidism of renal origin: Secondary | ICD-10-CM | POA: Diagnosis not present

## 2021-05-22 DIAGNOSIS — D509 Iron deficiency anemia, unspecified: Secondary | ICD-10-CM | POA: Diagnosis not present

## 2021-05-22 DIAGNOSIS — Z992 Dependence on renal dialysis: Secondary | ICD-10-CM | POA: Diagnosis not present

## 2021-05-22 DIAGNOSIS — D631 Anemia in chronic kidney disease: Secondary | ICD-10-CM | POA: Diagnosis not present

## 2021-05-22 DIAGNOSIS — N186 End stage renal disease: Secondary | ICD-10-CM | POA: Diagnosis not present

## 2021-05-24 ENCOUNTER — Encounter (HOSPITAL_COMMUNITY)
Admit: 2021-05-24 | Discharge: 2021-05-24 | Disposition: A | Discharge status: Home / Self Care | Payer: Medicare Other | Attending: Ophthalmology | Admitting: Ophthalmology

## 2021-05-24 DIAGNOSIS — D509 Iron deficiency anemia, unspecified: Secondary | ICD-10-CM | POA: Diagnosis not present

## 2021-05-24 DIAGNOSIS — N2581 Secondary hyperparathyroidism of renal origin: Secondary | ICD-10-CM | POA: Diagnosis not present

## 2021-05-24 DIAGNOSIS — N186 End stage renal disease: Secondary | ICD-10-CM | POA: Diagnosis not present

## 2021-05-24 DIAGNOSIS — Z992 Dependence on renal dialysis: Secondary | ICD-10-CM | POA: Diagnosis not present

## 2021-05-24 DIAGNOSIS — D631 Anemia in chronic kidney disease: Secondary | ICD-10-CM | POA: Diagnosis not present

## 2021-05-26 DIAGNOSIS — Z992 Dependence on renal dialysis: Secondary | ICD-10-CM | POA: Diagnosis not present

## 2021-05-26 DIAGNOSIS — N186 End stage renal disease: Secondary | ICD-10-CM | POA: Diagnosis not present

## 2021-05-27 DIAGNOSIS — N2581 Secondary hyperparathyroidism of renal origin: Secondary | ICD-10-CM | POA: Diagnosis not present

## 2021-05-27 DIAGNOSIS — D631 Anemia in chronic kidney disease: Secondary | ICD-10-CM | POA: Diagnosis not present

## 2021-05-27 DIAGNOSIS — N186 End stage renal disease: Secondary | ICD-10-CM | POA: Diagnosis not present

## 2021-05-27 DIAGNOSIS — Z992 Dependence on renal dialysis: Secondary | ICD-10-CM | POA: Diagnosis not present

## 2021-05-27 DIAGNOSIS — D509 Iron deficiency anemia, unspecified: Secondary | ICD-10-CM | POA: Diagnosis not present

## 2021-05-27 NOTE — Pre-Procedure Instructions (Signed)
[facility] and spoke with Ola, patients nurse and went over instructions verbally and faxed them over as well for preop information.

## 2021-05-27 NOTE — Patient Instructions (Signed)
    Zachary [name]  05/27/2021     @PREFPERIOPPHARMACY @   Your procedure is scheduled on  05/28/2021.   Report to [facility] at  [phone] A.M.   Call this number if you have problems the morning of surgery:  [phone]   Remember:  Do not eat or drink after midnight.  Take 22.5 units of glargine the night before your procedure.  DO NOT take any medications for diabetes the morning of your procedure.      Take these medicines the morning of surgery with A SIP OF WATER       levothyroxine, prilosec.      Do not wear jewelry, make-up or nail polish.  Do not wear lotions, powders, or perfumes, or deodorant.  Do not shave 48 hours prior to surgery.  Men may shave face and neck.  Do not bring valuables to the hospital.  [facility] is not responsible for any belongings or valuables.  Contacts, dentures or bridgework may not be worn into surgery.  Leave your suitcase in the car.  After surgery it may be brought to your room.  For patients admitted to the hospital, discharge time will be determined by your treatment team.

## 2021-05-28 ENCOUNTER — Inpatient Hospital Stay
Admission: RE | Admit: 2021-05-28 | Discharge: 2021-07-30 | Disposition: A | Discharge status: Nursing Home (Includes ICF) | Payer: Medicare Other | Source: Ambulatory Visit | Attending: Internal Medicine | Admitting: Internal Medicine

## 2021-05-28 ENCOUNTER — Encounter (HOSPITAL_COMMUNITY): Payer: Self-pay | Admitting: Vascular Surgery

## 2021-05-28 ENCOUNTER — Encounter (HOSPITAL_COMMUNITY)
Admission: RE | Disposition: A | Discharge status: Skilled Nursing Facility | Payer: Self-pay | Source: Home / Self Care | Attending: Vascular Surgery

## 2021-05-28 ENCOUNTER — Ambulatory Visit (HOSPITAL_COMMUNITY): Payer: Medicare Other | Admitting: Anesthesiology

## 2021-05-28 ENCOUNTER — Ambulatory Visit (HOSPITAL_COMMUNITY)
Admission: RE | Admit: 2021-05-28 | Discharge: 2021-05-28 | Disposition: A | Discharge status: Skilled Nursing Facility | Payer: Medicare Other | Attending: Vascular Surgery | Admitting: Vascular Surgery

## 2021-05-28 DIAGNOSIS — Z992 Dependence on renal dialysis: Secondary | ICD-10-CM | POA: Diagnosis not present

## 2021-05-28 DIAGNOSIS — X58XXXA Exposure to other specified factors, initial encounter: Secondary | ICD-10-CM | POA: Insufficient documentation

## 2021-05-28 DIAGNOSIS — N186 End stage renal disease: Secondary | ICD-10-CM | POA: Insufficient documentation

## 2021-05-28 DIAGNOSIS — D631 Anemia in chronic kidney disease: Secondary | ICD-10-CM | POA: Diagnosis not present

## 2021-05-28 DIAGNOSIS — E1122 Type 2 diabetes mellitus with diabetic chronic kidney disease: Secondary | ICD-10-CM | POA: Diagnosis not present

## 2021-05-28 DIAGNOSIS — I120 Hypertensive chronic kidney disease with stage 5 chronic kidney disease or end stage renal disease: Secondary | ICD-10-CM | POA: Diagnosis not present

## 2021-05-28 DIAGNOSIS — Z7982 Long term (current) use of aspirin: Secondary | ICD-10-CM | POA: Diagnosis not present

## 2021-05-28 DIAGNOSIS — Z809 Family history of malignant neoplasm, unspecified: Secondary | ICD-10-CM | POA: Diagnosis not present

## 2021-05-28 DIAGNOSIS — T82898A Other specified complication of vascular prosthetic devices, implants and grafts, initial encounter: Secondary | ICD-10-CM | POA: Diagnosis not present

## 2021-05-28 DIAGNOSIS — N185 Chronic kidney disease, stage 5: Secondary | ICD-10-CM | POA: Diagnosis not present

## 2021-05-28 HISTORY — PX: AV FISTULA PLACEMENT: SHX1204

## 2021-05-28 LAB — POCT I-STAT, CHEM 8
BUN: 13 mg/dL (ref 8–23)
Calcium, Ion: 0.79 mmol/L — CL (ref 1.15–1.40)
Chloride: 102 mmol/L (ref 98–111)
Creatinine, Ser: 8.3 mg/dL — ABNORMAL HIGH (ref 0.61–1.24)
Glucose, Bld: 89 mg/dL (ref 70–99)
HCT: 38 % — ABNORMAL LOW (ref 39.0–52.0)
Hemoglobin: 12.9 g/dL — ABNORMAL LOW (ref 13.0–17.0)
Potassium: 4.6 mmol/L (ref 3.5–5.1)
Sodium: 134 mmol/L — ABNORMAL LOW (ref 135–145)
TCO2: 26 mmol/L (ref 22–32)

## 2021-05-28 SURGERY — ARTERIOVENOUS (AV) FISTULA CREATION
Anesthesia: General | Site: Arm Lower | Laterality: Left

## 2021-05-28 MED ORDER — FENTANYL CITRATE (PF) 100 MCG/2ML IJ SOLN
INTRAMUSCULAR | Status: DC | PRN
  Administered 2021-05-28 (×2): 25 ug via INTRAVENOUS

## 2021-05-28 MED ORDER — PHENYLEPHRINE 40 MCG/ML (10ML) SYRINGE FOR IV PUSH (FOR BLOOD PRESSURE SUPPORT)
PREFILLED_SYRINGE | INTRAVENOUS | Status: AC
  Filled 2021-05-28: qty 10

## 2021-05-28 MED ORDER — SODIUM CHLORIDE 0.9 % IV SOLN: INTRAVENOUS | Status: DC

## 2021-05-28 MED ORDER — 0.9 % SODIUM CHLORIDE (POUR BTL) OPTIME
TOPICAL | Status: DC | PRN
  Administered 2021-05-28: 1000 mL

## 2021-05-28 MED ORDER — LIDOCAINE-EPINEPHRINE 0.5 %-1:200000 IJ SOLN
INTRAMUSCULAR | Status: DC | PRN
  Administered 2021-05-28: 9 mL

## 2021-05-28 MED ORDER — OXYCODONE-ACETAMINOPHEN 5-325 MG PO TABS: 1.0000 | ORAL_TABLET | Freq: Four times a day (QID) | ORAL | 0 refills | Status: DC | PRN

## 2021-05-28 MED ORDER — CEFAZOLIN SODIUM-DEXTROSE 2-4 GM/100ML-% IV SOLN
2.0000 g | INTRAVENOUS | Status: AC
  Administered 2021-05-28: 2 g via INTRAVENOUS

## 2021-05-28 MED ORDER — PROPOFOL 10 MG/ML IV BOLUS
INTRAVENOUS | Status: DC | PRN
Start: 2021-05-28 — End: 2021-05-28
  Administered 2021-05-28: 20 mg via INTRAVENOUS

## 2021-05-28 MED ORDER — MIDAZOLAM HCL 2 MG/2ML IJ SOLN
INTRAMUSCULAR | Status: DC | PRN
  Administered 2021-05-28: 1 mg via INTRAVENOUS

## 2021-05-28 MED ORDER — CHLORHEXIDINE GLUCONATE 0.12 % MT SOLN
15.0000 mL | Freq: Once | OROMUCOSAL | Status: AC
  Administered 2021-05-28: 15 mL via OROMUCOSAL

## 2021-05-28 MED ORDER — BUPIVACAINE-EPINEPHRINE (PF) 0.5% -1:200000 IJ SOLN
INTRAMUSCULAR | Status: AC
  Filled 2021-05-28: qty 30

## 2021-05-28 MED ORDER — HEPARIN 6000 UNIT IRRIGATION SOLUTION
Status: DC | PRN
  Administered 2021-05-28: 1

## 2021-05-28 MED ORDER — MIDAZOLAM HCL 2 MG/2ML IJ SOLN
INTRAMUSCULAR | Status: AC
  Filled 2021-05-28: qty 2

## 2021-05-28 MED ORDER — ONDANSETRON HCL 4 MG/2ML IJ SOLN: 4.0000 mg | Freq: Once | INTRAMUSCULAR | Status: DC | PRN

## 2021-05-28 MED ORDER — PROPOFOL 10 MG/ML IV BOLUS
INTRAVENOUS | Status: AC
  Filled 2021-05-28: qty 20

## 2021-05-28 MED ORDER — FENTANYL CITRATE (PF) 100 MCG/2ML IJ SOLN
INTRAMUSCULAR | Status: AC
  Filled 2021-05-28: qty 2

## 2021-05-28 MED ORDER — HEPARIN SODIUM (PORCINE) 1000 UNIT/ML IJ SOLN
INTRAMUSCULAR | Status: AC
  Filled 2021-05-28: qty 6

## 2021-05-28 MED ORDER — PHENYLEPHRINE HCL (PRESSORS) 10 MG/ML IV SOLN
INTRAVENOUS | Status: DC | PRN
  Administered 2021-05-28 (×2): 80 ug via INTRAVENOUS

## 2021-05-28 MED ORDER — PROPOFOL 500 MG/50ML IV EMUL
INTRAVENOUS | Status: DC | PRN
  Administered 2021-05-28: 25 ug/kg/min via INTRAVENOUS

## 2021-05-28 MED ORDER — CEFAZOLIN SODIUM-DEXTROSE 2-4 GM/100ML-% IV SOLN
INTRAVENOUS | Status: AC
  Filled 2021-05-28: qty 100

## 2021-05-28 MED ORDER — CHLORHEXIDINE GLUCONATE 4 % EX LIQD: 60.0000 mL | Freq: Once | CUTANEOUS | Status: DC

## 2021-05-28 MED ORDER — ORAL CARE MOUTH RINSE: 15.0000 mL | Freq: Once | OROMUCOSAL | Status: AC

## 2021-05-28 MED ORDER — FENTANYL CITRATE (PF) 100 MCG/2ML IJ SOLN: 25.0000 ug | INTRAMUSCULAR | Status: DC | PRN

## 2021-05-28 SURGICAL SUPPLY — 41 items
ADH SKN CLS APL DERMABOND .7 (GAUZE/BANDAGES/DRESSINGS) ×1
ARMBAND PINK RESTRICT EXTREMIT (MISCELLANEOUS) ×2 IMPLANT
BAG HAMPER (MISCELLANEOUS) ×2 IMPLANT
CANNULA VESSEL 3MM 2 BLNT TIP (CANNULA) ×2 IMPLANT
CLIP LIGATING EXTRA MED SLVR (CLIP) ×2 IMPLANT
CLIP LIGATING EXTRA SM BLUE (MISCELLANEOUS) ×2 IMPLANT
COVER LIGHT HANDLE STERIS (MISCELLANEOUS) ×4 IMPLANT
COVER MAYO STAND XLG (MISCELLANEOUS) ×2 IMPLANT
COVER PROBE W GEL 5X96 (DRAPES) IMPLANT
DECANTER SPIKE VIAL GLASS SM (MISCELLANEOUS) ×2 IMPLANT
DERMABOND ADVANCED (GAUZE/BANDAGES/DRESSINGS) ×1
DERMABOND ADVANCED .7 DNX12 (GAUZE/BANDAGES/DRESSINGS) ×1 IMPLANT
ELECT REM PT RETURN 9FT ADLT (ELECTROSURGICAL) ×2
ELECTRODE REM PT RTRN 9FT ADLT (ELECTROSURGICAL) ×1 IMPLANT
GAUZE SPONGE 4X4 12PLY STRL (GAUZE/BANDAGES/DRESSINGS) ×2 IMPLANT
GLOVE SS BIOGEL STRL SZ 7.5 (GLOVE) ×1 IMPLANT
GLOVE SUPERSENSE BIOGEL SZ 7.5 (GLOVE) ×1
GLOVE SURG UNDER POLY LF SZ7 (GLOVE) ×6 IMPLANT
GOWN STRL REUS W/TWL LRG LVL3 (GOWN DISPOSABLE) ×6 IMPLANT
IV NS 500ML (IV SOLUTION) ×2
IV NS 500ML BAXH (IV SOLUTION) ×1 IMPLANT
KIT BLADEGUARD II DBL (SET/KITS/TRAYS/PACK) ×2 IMPLANT
KIT TURNOVER KIT A (KITS) ×2 IMPLANT
MANIFOLD NEPTUNE II (INSTRUMENTS) ×2 IMPLANT
MARKER SKIN DUAL TIP RULER LAB (MISCELLANEOUS) ×4 IMPLANT
NEEDLE HYPO 18GX1.5 BLUNT FILL (NEEDLE) ×4 IMPLANT
NS IRRIG 1000ML POUR BTL (IV SOLUTION) ×2 IMPLANT
PACK CV ACCESS (CUSTOM PROCEDURE TRAY) ×2 IMPLANT
PAD ARMBOARD 7.5X6 YLW CONV (MISCELLANEOUS) ×4 IMPLANT
SET BASIN LINEN APH (SET/KITS/TRAYS/PACK) ×2 IMPLANT
SOL PREP POV-IOD 4OZ 10% (MISCELLANEOUS) ×2 IMPLANT
SOL PREP PROV IODINE SCRUB 4OZ (MISCELLANEOUS) ×2 IMPLANT
SUT PROLENE 6 0 CC (SUTURE) ×2 IMPLANT
SUT SILK 2 0 SH (SUTURE) IMPLANT
SUT VIC AB 3-0 SH 27 (SUTURE) ×2
SUT VIC AB 3-0 SH 27X BRD (SUTURE) ×1 IMPLANT
SYR 10ML LL (SYRINGE) ×2 IMPLANT
SYR 20ML LL LF (SYRINGE) ×2 IMPLANT
SYR BULB IRRIG 60ML STRL (SYRINGE) ×2 IMPLANT
SYR CONTROL 10ML LL (SYRINGE) ×2 IMPLANT
UNDERPAD 30X36 HEAVY ABSORB (UNDERPADS AND DIAPERS) ×2 IMPLANT

## 2021-05-28 NOTE — Anesthesia Preprocedure Evaluation (Signed)
Anesthesia Evaluation  Patient identified by MRN, date of birth, ID band Patient awake    Reviewed: Allergy & Precautions, NPO status , Patient's Chart, lab work & pertinent test results  History of Anesthesia Complications Negative for: history of anesthetic complications  Airway Mallampati: III  TM Distance: >3 FB Neck ROM: Full    Dental  (+) Dental Advisory Given, Poor Dentition, Missing   Pulmonary neg pulmonary ROS,    Pulmonary exam normal breath sounds clear to auscultation       Cardiovascular hypertension, Pt. on medications Normal cardiovascular exam Rhythm:Regular Rate:Normal     Neuro/Psych PSYCHIATRIC DISORDERS Anxiety Depression Mental retardation     GI/Hepatic Neg liver ROS, GERD  Medicated and Controlled,  Endo/Other  diabetes, Well Controlled, Type 2, Insulin Dependent, Oral Hypoglycemic AgentsHypothyroidism   Renal/GU ESRF and DialysisRenal disease     Musculoskeletal   Abdominal   Peds  Hematology  (+) anemia ,   Anesthesia Other Findings   Reproductive/Obstetrics                            Anesthesia Physical Anesthesia Plan  ASA: 3  Anesthesia Plan: General   Post-op Pain Management:    Induction: Intravenous  PONV Risk Score and Plan: Propofol infusion  Airway Management Planned: Nasal Cannula, Natural Airway and Simple Face Mask  Additional Equipment:   Intra-op Plan:   Post-operative Plan:   Informed Consent: I have reviewed the patients History and Physical, chart, labs and discussed the procedure including the risks, benefits and alternatives for the proposed anesthesia with the patient or authorized representative who has indicated his/her understanding and acceptance.     Dental advisory given  Plan Discussed with: CRNA and Surgeon  Anesthesia Plan Comments: (Possible GA with LMA)       Anesthesia Quick Evaluation

## 2021-05-28 NOTE — Interval H&P Note (Signed)
History and Physical Interval Note:  05/28/2021 7:14 AM  Zachary [name]  has presented today for surgery, with the diagnosis of ESRD.  The various methods of treatment have been discussed with the patient and family. After consideration of risks, benefits and other options for treatment, the patient has consented to  Procedure(s): LEFT ARM ARTERIOVENOUS (AV) FISTULA VERSUS ARTERIOVENOUS GRAFT CREATION (Left) as a surgical intervention.  [name] history has been reviewed, patient examined, no change in status, stable for surgery.  I have reviewed the patient's chart and labs.  Questions were answered to the patient's satisfaction.     Curt Jews

## 2021-05-28 NOTE — Op Note (Signed)
    OPERATIVE REPORT  DATE OF SURGERY: 05/28/2021  PATIENT: Zachary GUTRIDGE, 75 y.o. male MRN: [id number]  DOB: 01/10/1946  PRE-OPERATIVE DIAGNOSIS end-stage renal disease  POST-OPERATIVE DIAGNOSIS:  Same  PROCEDURE: Left first stage brachiobasilic fistula  SURGEON:  Curt Jews, M.D.  PHYSICIAN ASSISTANT: Fulton Mole, RNFA  The assistant was needed for exposure and to expedite the case  ANESTHESIA: Local with sedation  EBL: per anesthesia record  Total I/O In: 400 [I.V.:300; IV Piggyback:100] Out: 5 [Blood:5]  BLOOD ADMINISTERED: none  DRAINS: none  SPECIMEN: none  COUNTS CORRECT:  YES  PATIENT DISPOSITION:  PACU - hemodynamically stable  PROCEDURE DETAILS: The patient was taken operating placed supine position where the area of the left arm prepped draped you sterile fashion.  SonoSite ultrasound was used to visualize the basilic vein which was of good caliber.  Incision was made between the level of the basilic vein and the brachial artery at the antecubital space and the basilic vein was identified.  The patient had several branches and the largest of these was chosen.  Side branches were ligated with 3-0 and 4-0 silk ties and divided.  The brachial artery was exposed through the same incision.  The old cephalic vein anastomosis was exposed.  The artery was occluded proximal distal to the old cephalic vein anastomosis and the old cephalic vein anastomosis was reopened.  The basilic vein was mobilized and was cut to the appropriate length and was spatulated and sewn end-to-side to the brachial artery with a running 6-0 Prolene suture.  Clamps removed and excellent thrill was noted.  The wounds irrigated with saline.  Hemostasis attained after cautery.  Wounds were closed with 3-0 Vicryl in the subcutaneous and subcuticular tissue.  Sterile dressing was applied and the patient was transferred to the recovery room in stable condition   Rosetta Posner, M.D., [facility] 05/28/2021 9:10  AM  Note: Portions of this report may have been transcribed using voice recognition software.  Every effort has been made to ensure accuracy; however, inadvertent computerized transcription errors may still be present.

## 2021-05-28 NOTE — Anesthesia Postprocedure Evaluation (Signed)
Anesthesia Post Note  Patient: Zachary [name]  Procedure(s) Performed: LEFT ARM ARTERIOVENOUS (AV) FISTULA (Left: Arm Lower)  Patient location during evaluation: PACU Anesthesia Type: General Level of consciousness: awake and alert and oriented Pain management: pain level controlled Vital Signs Assessment: post-procedure vital signs reviewed and stable Respiratory status: spontaneous breathing and respiratory function stable Cardiovascular status: blood pressure returned to baseline and stable Postop Assessment: no apparent nausea or vomiting Anesthetic complications: no   No notable events documented.   Last Vitals:  Vitals:   05/28/21 0704 05/28/21 0907  BP: 128/77 116/66  Pulse: 99 81  Resp: 18 18  Temp: 37.2 C 36.7 C  SpO2: 100% 96%    Last Pain:  Vitals:   05/28/21 0907  TempSrc: Oral  PainSc: 0-No pain                 [name]

## 2021-05-28 NOTE — Progress Notes (Signed)
Report to Inez Catalina, Therapist, sports at [place] [facility]

## 2021-05-28 NOTE — Discharge Instructions (Signed)
[facility]  Discharge Instructions  AV Fistula or Graft Surgery for Dialysis Access  Please refer to the following instructions for your post-procedure care. Your surgeon or physician assistant will discuss any changes with you.  Activity  You may drive the day following your surgery, if you are comfortable and no longer taking prescription pain medication. Resume full activity as the soreness in your incision resolves.  Bathing/Showering  You may shower after you go home. Keep your incision dry for 48 hours. Do not soak in a bathtub, hot tub, or swim until the incision heals completely. You may not shower if you have a hemodialysis catheter.  Incision Care  Clean your incision with mild soap and water after 48 hours. Pat the area dry with a clean towel. You do not need a bandage unless otherwise instructed. Do not apply any ointments or creams to your incision. You may have skin glue on your incision. Do not peel it off. It will come off on its own in about one week. Your arm may swell a bit after surgery. To reduce swelling use pillows to elevate your arm so it is above your heart. Your doctor will tell you if you need to lightly wrap your arm with an ACE bandage.  Diet  Resume your normal diet. There are not special food restrictions following this procedure. In order to heal from your surgery, it is CRITICAL to get adequate nutrition. Your body requires vitamins, minerals, and protein. Vegetables are the best source of vitamins and minerals. Vegetables also provide the perfect balance of protein. Processed food has little nutritional value, so try to avoid this.  Medications  Resume taking all of your medications. If your incision is causing pain, you may take over-the counter pain relievers such as acetaminophen (Tylenol). If you were prescribed a stronger pain medication, please be aware these medications can cause nausea and constipation. Prevent  nausea by taking the medication with a snack or meal. Avoid constipation by drinking plenty of fluids and eating foods with high amount of fiber, such as fruits, vegetables, and grains.  Do not take Tylenol if you are taking prescription pain medications.  Follow up Your surgeon may want to see you in the office following your access surgery. If so, this will be arranged at the time of your surgery.  Please call us immediately for any of the following conditions:  Increased pain, redness, drainage (pus) from your incision site Fever of 101 degrees or higher Severe or worsening pain at your incision site Hand pain or numbness.  Reduce your risk of vascular disease:  Stop smoking. If you would like help, call [facility] at 1-800-QUIT-NOW 708-377-8217) or [place] at [place] your cholesterol Maintain a desired weight Control your diabetes Keep your blood pressure down  Dialysis  It will take several weeks to several months for your new dialysis access to be ready for use. Your surgeon will determine when it is okay to use it. Your nephrologist will continue to direct your dialysis. You can continue to use your Permcath until your new access is ready for use.   05/28/2021 Zachary [name] [id number] 09-25-1946  Surgeon(s): Alanee Ting, Arvilla Meres, MD  Procedure(s): LEFT ARM ARTERIOVENOUS (AV) FISTULA VERSUS ARTERIOVENOUS GRAFT CREATION   May stick graft immediately   May stick graft on designated area only:    Do not stick fistula for 12 weeks    If you have any questions, please call the  office at [phone].

## 2021-05-28 NOTE — Transfer of Care (Signed)
Immediate Anesthesia Transfer of Care Note  Patient: Zachary [name]  Procedure(s) Performed: LEFT ARM ARTERIOVENOUS (AV) FISTULA VERSUS ARTERIOVENOUS GRAFT CREATION (Left)  Patient Location: Short Stay  Anesthesia Type:General  Level of Consciousness: awake, alert  and oriented  Airway & Oxygen Therapy: Patient Spontanous Breathing  Post-op Assessment: Report given to RN and Post -op Vital signs reviewed and stable  Post vital signs: Reviewed and stable  Last Vitals:  Vitals Value Taken Time  BP    Temp    Pulse    Resp    SpO2      Last Pain:  Vitals:   05/28/21 0704  TempSrc: Oral  PainSc: 0-No pain      Patients Stated Pain Goal: 6 (54/56/25 6389)  Complications: No notable events documented.

## 2021-05-28 NOTE — Interval H&P Note (Signed)
History and Physical Interval Note:  05/28/2021 7:17 AM  Zachary [name]  has presented today for surgery, with the diagnosis of ESRD.  The various methods of treatment have been discussed with the patient and family. After consideration of risks, benefits and other options for treatment, the patient has consented to  Procedure(s): LEFT ARM ARTERIOVENOUS (AV) FISTULA VERSUS ARTERIOVENOUS GRAFT CREATION (Left) as a surgical intervention.  [name] history has been reviewed, patient examined, no change in status, stable for surgery.  I have reviewed the patient's chart and labs.  Questions were answered to the patient's satisfaction.     Curt Jews

## 2021-05-29 ENCOUNTER — Encounter (HOSPITAL_COMMUNITY): Payer: Self-pay | Admitting: Vascular Surgery

## 2021-05-29 DIAGNOSIS — D631 Anemia in chronic kidney disease: Secondary | ICD-10-CM | POA: Diagnosis not present

## 2021-05-29 DIAGNOSIS — N186 End stage renal disease: Secondary | ICD-10-CM | POA: Diagnosis not present

## 2021-05-29 DIAGNOSIS — N2581 Secondary hyperparathyroidism of renal origin: Secondary | ICD-10-CM | POA: Diagnosis not present

## 2021-05-29 DIAGNOSIS — D509 Iron deficiency anemia, unspecified: Secondary | ICD-10-CM | POA: Diagnosis not present

## 2021-05-29 DIAGNOSIS — Z992 Dependence on renal dialysis: Secondary | ICD-10-CM | POA: Diagnosis not present

## 2021-05-31 DIAGNOSIS — D509 Iron deficiency anemia, unspecified: Secondary | ICD-10-CM | POA: Diagnosis not present

## 2021-05-31 DIAGNOSIS — D631 Anemia in chronic kidney disease: Secondary | ICD-10-CM | POA: Diagnosis not present

## 2021-05-31 DIAGNOSIS — N2581 Secondary hyperparathyroidism of renal origin: Secondary | ICD-10-CM | POA: Diagnosis not present

## 2021-05-31 DIAGNOSIS — Z992 Dependence on renal dialysis: Secondary | ICD-10-CM | POA: Diagnosis not present

## 2021-05-31 DIAGNOSIS — N186 End stage renal disease: Secondary | ICD-10-CM | POA: Diagnosis not present

## 2021-06-03 DIAGNOSIS — N2581 Secondary hyperparathyroidism of renal origin: Secondary | ICD-10-CM | POA: Diagnosis not present

## 2021-06-03 DIAGNOSIS — Z992 Dependence on renal dialysis: Secondary | ICD-10-CM | POA: Diagnosis not present

## 2021-06-03 DIAGNOSIS — D509 Iron deficiency anemia, unspecified: Secondary | ICD-10-CM | POA: Diagnosis not present

## 2021-06-03 DIAGNOSIS — N186 End stage renal disease: Secondary | ICD-10-CM | POA: Diagnosis not present

## 2021-06-03 DIAGNOSIS — D631 Anemia in chronic kidney disease: Secondary | ICD-10-CM | POA: Diagnosis not present

## 2021-06-05 DIAGNOSIS — D509 Iron deficiency anemia, unspecified: Secondary | ICD-10-CM | POA: Diagnosis not present

## 2021-06-05 DIAGNOSIS — N2581 Secondary hyperparathyroidism of renal origin: Secondary | ICD-10-CM | POA: Diagnosis not present

## 2021-06-05 DIAGNOSIS — Z992 Dependence on renal dialysis: Secondary | ICD-10-CM | POA: Diagnosis not present

## 2021-06-05 DIAGNOSIS — D631 Anemia in chronic kidney disease: Secondary | ICD-10-CM | POA: Diagnosis not present

## 2021-06-05 DIAGNOSIS — N186 End stage renal disease: Secondary | ICD-10-CM | POA: Diagnosis not present

## 2021-06-07 DIAGNOSIS — N2581 Secondary hyperparathyroidism of renal origin: Secondary | ICD-10-CM | POA: Diagnosis not present

## 2021-06-07 DIAGNOSIS — N186 End stage renal disease: Secondary | ICD-10-CM | POA: Diagnosis not present

## 2021-06-07 DIAGNOSIS — Z992 Dependence on renal dialysis: Secondary | ICD-10-CM | POA: Diagnosis not present

## 2021-06-07 DIAGNOSIS — D509 Iron deficiency anemia, unspecified: Secondary | ICD-10-CM | POA: Diagnosis not present

## 2021-06-07 DIAGNOSIS — D631 Anemia in chronic kidney disease: Secondary | ICD-10-CM | POA: Diagnosis not present

## 2021-06-10 DIAGNOSIS — N186 End stage renal disease: Secondary | ICD-10-CM | POA: Diagnosis not present

## 2021-06-10 DIAGNOSIS — Z992 Dependence on renal dialysis: Secondary | ICD-10-CM | POA: Diagnosis not present

## 2021-06-10 DIAGNOSIS — N2581 Secondary hyperparathyroidism of renal origin: Secondary | ICD-10-CM | POA: Diagnosis not present

## 2021-06-10 DIAGNOSIS — D509 Iron deficiency anemia, unspecified: Secondary | ICD-10-CM | POA: Diagnosis not present

## 2021-06-10 DIAGNOSIS — D631 Anemia in chronic kidney disease: Secondary | ICD-10-CM | POA: Diagnosis not present

## 2021-06-12 ENCOUNTER — Other Ambulatory Visit (HOSPITAL_COMMUNITY)
Admission: RE | Admit: 2021-06-12 | Discharge: 2021-06-12 | Disposition: A | Discharge status: Home / Self Care | Payer: Medicare Other | Source: Skilled Nursing Facility | Attending: Adult Health | Admitting: Adult Health

## 2021-06-12 ENCOUNTER — Encounter: Payer: Self-pay | Admitting: Adult Health

## 2021-06-12 ENCOUNTER — Non-Acute Institutional Stay (SKILLED_NURSING_FACILITY): Payer: Medicare Other | Admitting: Adult Health

## 2021-06-12 DIAGNOSIS — Z992 Dependence on renal dialysis: Secondary | ICD-10-CM | POA: Diagnosis not present

## 2021-06-12 DIAGNOSIS — N2581 Secondary hyperparathyroidism of renal origin: Secondary | ICD-10-CM | POA: Diagnosis not present

## 2021-06-12 DIAGNOSIS — R946 Abnormal results of thyroid function studies: Secondary | ICD-10-CM | POA: Insufficient documentation

## 2021-06-12 DIAGNOSIS — E034 Atrophy of thyroid (acquired): Secondary | ICD-10-CM | POA: Diagnosis not present

## 2021-06-12 DIAGNOSIS — D631 Anemia in chronic kidney disease: Secondary | ICD-10-CM | POA: Diagnosis not present

## 2021-06-12 DIAGNOSIS — N186 End stage renal disease: Secondary | ICD-10-CM | POA: Diagnosis not present

## 2021-06-12 DIAGNOSIS — D509 Iron deficiency anemia, unspecified: Secondary | ICD-10-CM | POA: Diagnosis not present

## 2021-06-12 LAB — TSH: TSH: 5.269 u[IU]/mL — ABNORMAL HIGH (ref 0.350–4.500)

## 2021-06-12 NOTE — Progress Notes (Signed)
Location:  [place] Room Number: 112 Place of Service:  SNF (31)   CODE STATUS: FULL CODE  Allergies  Allergen Reactions   No Known Allergies     Chief Complaint  Patient presents with   Acute Visit   Follow-up    Lab follow up    HPI:  His tsh is slightly elevated at 5.269. he denies any increased lethargy. His weight is stable. There are no reports of changes in appetite; no excessive dry skin. 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[place])    Tuesday, Thursday and Saturday,    DVT of leg (deep venous thrombosis) ([place]) 12/25/2016   Edema     history of lower extremity edema   GERD (gastroesophageal reflux disease)    Heme positive stool    Hydronephrosis    Hyperkalemia    Hyperlipidemia    Hypernatremia    Hypertension    Hypothyroidism    Impaired speech    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[place])    MR (mental retardation)    Muscle weakness    Obstructive uropathy    Perinephric abscess 10/07/2011   Poor historian poor historian   Protein calorie malnutrition ([place])    Pyelonephritis    Renal failure (ARF), acute on chronic (HCC)    Renal insufficiency    chronic history   Sepsis ([place])    Smoking    Uremia    Urinary retention    UTI (lower urinary tract infection) 10/06/2011    Past Surgical History:  Procedure Laterality Date   A/V FISTULAGRAM N/A 08/13/2018   Procedure: A/V FISTULAGRAM - Right Upper;  Surgeon: Elam Dutch, MD;  Location: [facility] [facility] [facility];  Service: Cardiovascular;  Laterality: N/A;   A/V FISTULAGRAM N/A 11/22/2018   Procedure: A/V FISTULAGRAM - Right Upper;  Surgeon: Waynetta Sandy, MD;  Location:  [facility];  Service: Cardiovascular;  Laterality: N/A;   AV FISTULA PLACEMENT Left 07/06/2015   Procedure:  INSERTION LEFT ARM ARTERIOVENOUS GORTEX GRAFT;  Surgeon: Angelia Mould, MD;  Location: [facility];  Service: Vascular;  Laterality: Left;   AV FISTULA PLACEMENT Right 02/26/2016   Procedure: ARTERIOVENOUS (AV) FISTULA CREATION ;  Surgeon: Angelia Mould, MD;  Location: [place];  Service: Vascular;  Laterality: Right;   AV FISTULA PLACEMENT Right 11/25/2018   Procedure: INSERTION OF ARTERIOVENOUS (AV) ARTEGRAFT RIGHT UPPER ARM;  Surgeon: Waynetta Sandy, MD;  Location: [place];  Service: Vascular;  Laterality: Right;   AV FISTULA PLACEMENT Left 05/28/2021   Procedure: LEFT ARM ARTERIOVENOUS (AV) FISTULA;  Surgeon: Rosetta Posner, MD;  Location: [facility];  Service: Vascular;  Laterality: Left;   [place] REMOVAL Left 10/09/2015   Procedure: REMOVAL OF ARTERIOVENOUS GORETEX GRAFT ([place]) Evacuation of Lymphocele, Vein Patch angioplasty of brachial artery.;  Surgeon: Angelia Mould, MD;  Location: [facility];  Service: Vascular;  Laterality: Left;   [place] Right 02/26/2016   Procedure: Right [place];  Surgeon: Angelia Mould, MD;  Location: [place] [facility];  Service: Vascular;  Laterality: Right;   CIRCUMCISION N/A 01/04/2014   Procedure: CIRCUMCISION  ADULT (procedure #1);  Surgeon: Marissa Nestle, MD;  Location: [facility];  Service: Urology;  Laterality: N/A;   COLECTOMY N/A 05/04/2017   Procedure: TOTAL COLECTOMY;  Surgeon: Aviva Signs, MD;  Location: [facility];  Service: General;  Laterality: N/A;   COLONOSCOPY N/A 04/27/2017   Procedure: COLONOSCOPY;  Surgeon: Daneil Dolin, MD;  [facility];  Service: Endoscopy;  Laterality: N/A;  245   CYSTOSCOPY W/ RETROGRADES Bilateral 06/29/2015   Procedure: CYSTOSCOPY, DILATION OF URETHRAL STRICTURE WITH BILATERAL RETROGRADE PYELOGRAM,SUPRAPUBIC TUBE CHANGE;  Surgeon: Festus Aloe, MD;   Location: [facility];  Service: Urology;  Laterality: Bilateral;   CYSTOSCOPY WITH URETHRAL DILATATION N/A 12/29/2013   Procedure: CYSTOSCOPY WITH URETHRAL DILATATION;  Surgeon: Marissa Nestle, MD;  Location: [facility];  Service: Urology;  Laterality: N/A;   ESOPHAGOGASTRODUODENOSCOPY N/A 04/27/2017   Procedure: ESOPHAGOGASTRODUODENOSCOPY (EGD);  Surgeon: Daneil Dolin, MD;  [facility];  Service: Endoscopy;  Laterality: N/A;   INSERTION OF DIALYSIS CATHETER Right 11/25/2018   Procedure: INSERTION OF DIALYSIS CATHETER RIGHT INTERNAL JUGULAR;  Surgeon: Waynetta Sandy, MD;  Location: [place];  Service: Vascular;  Laterality: Right;   IR AV DIALY SHUNT INTRO NEEDLE/INTRACATH INITIAL W/PTA/IMG RIGHT Right 09/07/2018   IR FLUORO GUIDE CV LINE RIGHT  10/16/2020   IR REMOVAL TUN CV CATH W/O FL  01/12/2019   IR THROMBECTOMY AV FISTULA W/THROMBOLYSIS/PTA INC/SHUNT/IMG RIGHT Right 04/26/2018   IR US GUIDE VASC ACCESS RIGHT  04/26/2018   IR US GUIDE VASC ACCESS RIGHT  09/07/2018   IR US GUIDE VASC ACCESS RIGHT  10/16/2020   IR US GUIDE VASC ACCESS RIGHT  10/16/2020   ORIF FEMUR FRACTURE Right 11/22/2016   Procedure: OPEN REDUCTION INTERNAL FIXATION (ORIF) DISTAL FEMUR FRACTURE;  Surgeon: Rod Can, MD;  Location: [facility];  Service: Orthopedics;  Laterality: Right;   PATCH ANGIOPLASTY Right 12/10/2017   Procedure: PATCH ANGIOPLASTY;  Surgeon: Angelia Mould, MD;  Location: [place];  Service: Vascular;  Laterality: Right;   PERIPHERAL VASCULAR BALLOON ANGIOPLASTY  08/13/2018   Procedure: PERIPHERAL VASCULAR BALLOON ANGIOPLASTY;  Surgeon: Elam Dutch, MD;  Location: [place] [facility];  Service: Cardiovascular;;  right AV fistula    PERIPHERAL VASCULAR BALLOON ANGIOPLASTY  11/22/2018   Procedure: PERIPHERAL VASCULAR BALLOON ANGIOPLASTY;  Surgeon: Waynetta Sandy, MD;  Location: [facility] [facility];  Service: Cardiovascular;;  rt AV fistula   PERIPHERAL VASCULAR CATHETERIZATION  N/A 10/08/2015   Procedure: A/V Shuntogram;  Surgeon: Angelia Mould, MD;  Location: [place] [facility];  Service: Cardiovascular;  Laterality: N/A;   THROMBECTOMY W/ EMBOLECTOMY Right 12/10/2017   Procedure: THROMBECTOMY REVISION RIGHT ARM  ARTERIOVENOUS FISTULA;  Surgeon: Angelia Mould, MD;  Location: [facility];  Service: Vascular;  Laterality: Right;   TRANSURETHRAL RESECTION OF PROSTATE N/A 01/04/2014   Procedure: TRANSURETHRAL RESECTION OF THE PROSTATE (TURP) (procedure #2);  Surgeon: Marissa Nestle, MD;  [facility];  Service: Urology;  Laterality: N/A;    Social History   Socioeconomic History   Marital status: Single    Spouse name: Not on file   Number of children: Not on file   Years of education: Not on file   Highest education level: Not on file  Occupational History   Occupation: retired   Tobacco Use   Smoking status: Never   Smokeless tobacco: Never  Vaping Use   Vaping Use: Never used  Substance and Sexual Activity   Alcohol use: No  Drug use: No   Sexual activity: Not Currently  Other Topics Concern   Not on file  Social History Narrative   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   Long term resident of [facility]    Social Determinants of Health   Financial Resource Strain: Not on file  Food Insecurity: Not on file  Transportation Needs: Not on file  Physical Activity: Not on file  Stress: Not on file  Social Connections: Not on file  Intimate Partner Violence: Not on file   Family History  Problem Relation Age of Onset   Cancer Mother    Colon cancer Neg Hx       VITAL SIGNS BP (!) 100/57   Pulse 80   Temp (!) 96.9 F (36.1 C)   Resp 20   Ht 5\' 8"  (1.727 m)   Wt 215 lb 6.4 oz (97.7 kg)   SpO2 99%   BMI 32.75 kg/m   Outpatient Encounter Medications as of 06/12/2021  Medication Sig   aspirin EC 81 MG tablet Take 81 mg by mouth daily. for elevated d-dimer   atorvastatin (LIPITOR) 80 MG  tablet Take 80 mg by mouth every evening.   insulin aspart (NOVOLOG) 100 UNIT/ML FlexPen Inject 15-20 Units into the skin See admin instructions. Give 15 units with breakfast and lunch and give 20 units with evening meal (Hold if CBG <150)   Insulin Glargine (BASAGLAR KWIKPEN) 100 UNIT/ML SOPN Inject 45 Units into the skin at bedtime.    JANUVIA 25 MG tablet Take 25 mg by mouth daily with supper.   latanoprost (XALATAN) 0.005 % ophthalmic solution Place 1 drop into both eyes at bedtime. 9 pm for glaucoma   levothyroxine (SYNTHROID) 100 MCG tablet Take 100 mcg by mouth daily before breakfast.   midodrine (PROAMATINE) 10 MG tablet Take 10 mg by mouth. Send med with resident on Mon/Wed/Fri to dialysis. do not give at [facility] give to resident to take with him. send with 5mg  to equal 15mg .   midodrine (PROAMATINE) 5 MG tablet Take 5 mg by mouth. Send with resident on dialysis days.  Mon, Wed, Fri.  Take along with 10 mg to equal 15 mg   omega-3 acid ethyl esters (LOVAZA) 1 g capsule Take 2 g by mouth at bedtime.   omeprazole (PRILOSEC) 40 MG capsule Take 40 mg by mouth daily.    polyethylene glycol (MIRALAX / GLYCOLAX) packet Take 17 g by mouth daily as needed for mild constipation.    sevelamer carbonate (RENVELA) 800 MG tablet Take 2,400 mg by mouth 3 (three) times daily with meals.   tamsulosin (FLOMAX) 0.4 MG CAPS capsule Take 0.4 mg by mouth every evening. Give 30 minutes after a meal   torsemide (DEMADEX) 10 MG tablet Take 50 mg by mouth daily with supper. 50mg  (10mg  tablets x 5)   [DISCONTINUED] oxyCODONE-acetaminophen (PERCOCET) 5-325 MG tablet Take 1 tablet by mouth every 6 (six) hours as needed for severe pain.   No facility-administered encounter medications on file as of 06/12/2021.     SIGNIFICANT DIAGNOSTIC EXAMS   PREVIOUS   05-02-21: Chest x-ray: dialysis cath demonstrated on right. No acute cardiopulmonary process  NO NEW EXAMS.    LABS REVIEWED PREVIOUS;  07-04-20: PSA:  2.79  07-19-20: hgb a1c 7.7 09-10-20: wbc 11.8; hgb 12.9; hct 40.4; mcv 108.6 plt 163; glucose 225; bun 30;creat 8.92; k+ 3.8; na++ 138; ca 9.2 10-12-20: chol 76; ldl 64; trig 319; hdl 24; hgb a1c 7.4  10-17-20: wbc 8.2; hgb 11.0; hct 34.3; mcv 108.2 plt 121; glucose 141; bun 51; creat 13.25; k+ 3.9; na++ 138; ca 8.5 mag 2.1  01-28-21: hgb a1c 6.5  03-07-21: tsh 5.008  05-02-21: wbc 3.8; hgb 10.9; hct 33.7; mcv 105.6 plt 106; glucose 105; bun 17; creat 7.94; k+ 3.8; na++ 138; ca 9.1; GFR 7; d-dimer: 0.95; CRP 0.7  TODAY  06-12-21: tsh 5.269   Review of Systems  Constitutional:  Negative for malaise/fatigue.  Respiratory:  Negative for cough and shortness of breath.   Cardiovascular:  Negative for chest pain, palpitations and leg swelling.  Gastrointestinal:  Negative for abdominal pain, constipation and heartburn.  Musculoskeletal:  Negative for back pain, joint pain and myalgias.  Skin: Negative.   Neurological:  Negative for dizziness.  Psychiatric/Behavioral:  The patient is not nervous/anxious.    Physical Exam Constitutional:      General: He is not in acute distress.    Appearance: He is well-developed. He is not diaphoretic.  Neck:     Thyroid: No thyromegaly.  Cardiovascular:     Rate and Rhythm: Normal rate and regular rhythm.     Heart sounds: Normal heart sounds.  Pulmonary:     Effort: Pulmonary effort is normal. No respiratory distress.     Breath sounds: Normal breath sounds.  Abdominal:     General: Bowel sounds are normal. There is no distension.     Palpations: Abdomen is soft.     Tenderness: There is no abdominal tenderness.  Genitourinary:    Comments: Hx turp  Musculoskeletal:        General: Normal range of motion.     Cervical back: Neck supple.     Right lower leg: No edema.     Left lower leg: No edema.     Comments: History of right femur ORIF   Lymphadenopathy:     Cervical: No cervical adenopathy.  Skin:    General: Skin is warm and dry.      Comments: Right chest wall access   Neurological:     Mental Status: He is alert. Mental status is at baseline.  Psychiatric:        Mood and Affect: Mood normal.     ASSESSMENT/ PLAN:  TODAY  Hypothyroidism due to acquired atrophy of thyroid: is worse: level is 5.269 will change his synthroid to 125 mcg daily  will repeat labs 08-12-21    Ok Edwards NP [facility]  Contact [phone] Monday through Friday 8am- 5pm  After hours call [phone]

## 2021-06-14 DIAGNOSIS — D509 Iron deficiency anemia, unspecified: Secondary | ICD-10-CM | POA: Diagnosis not present

## 2021-06-14 DIAGNOSIS — N2581 Secondary hyperparathyroidism of renal origin: Secondary | ICD-10-CM | POA: Diagnosis not present

## 2021-06-14 DIAGNOSIS — D631 Anemia in chronic kidney disease: Secondary | ICD-10-CM | POA: Diagnosis not present

## 2021-06-14 DIAGNOSIS — Z992 Dependence on renal dialysis: Secondary | ICD-10-CM | POA: Diagnosis not present

## 2021-06-14 DIAGNOSIS — N186 End stage renal disease: Secondary | ICD-10-CM | POA: Diagnosis not present

## 2021-06-17 DIAGNOSIS — D631 Anemia in chronic kidney disease: Secondary | ICD-10-CM | POA: Diagnosis not present

## 2021-06-17 DIAGNOSIS — D509 Iron deficiency anemia, unspecified: Secondary | ICD-10-CM | POA: Diagnosis not present

## 2021-06-17 DIAGNOSIS — N186 End stage renal disease: Secondary | ICD-10-CM | POA: Diagnosis not present

## 2021-06-17 DIAGNOSIS — N2581 Secondary hyperparathyroidism of renal origin: Secondary | ICD-10-CM | POA: Diagnosis not present

## 2021-06-17 DIAGNOSIS — Z992 Dependence on renal dialysis: Secondary | ICD-10-CM | POA: Diagnosis not present

## 2021-06-19 DIAGNOSIS — Z992 Dependence on renal dialysis: Secondary | ICD-10-CM | POA: Diagnosis not present

## 2021-06-19 DIAGNOSIS — N186 End stage renal disease: Secondary | ICD-10-CM | POA: Diagnosis not present

## 2021-06-19 DIAGNOSIS — D509 Iron deficiency anemia, unspecified: Secondary | ICD-10-CM | POA: Diagnosis not present

## 2021-06-19 DIAGNOSIS — D631 Anemia in chronic kidney disease: Secondary | ICD-10-CM | POA: Diagnosis not present

## 2021-06-19 DIAGNOSIS — N2581 Secondary hyperparathyroidism of renal origin: Secondary | ICD-10-CM | POA: Diagnosis not present

## 2021-06-21 DIAGNOSIS — Z992 Dependence on renal dialysis: Secondary | ICD-10-CM | POA: Diagnosis not present

## 2021-06-21 DIAGNOSIS — D509 Iron deficiency anemia, unspecified: Secondary | ICD-10-CM | POA: Diagnosis not present

## 2021-06-21 DIAGNOSIS — N2581 Secondary hyperparathyroidism of renal origin: Secondary | ICD-10-CM | POA: Diagnosis not present

## 2021-06-21 DIAGNOSIS — N186 End stage renal disease: Secondary | ICD-10-CM | POA: Diagnosis not present

## 2021-06-21 DIAGNOSIS — D631 Anemia in chronic kidney disease: Secondary | ICD-10-CM | POA: Diagnosis not present

## 2021-06-24 DIAGNOSIS — N2581 Secondary hyperparathyroidism of renal origin: Secondary | ICD-10-CM | POA: Diagnosis not present

## 2021-06-24 DIAGNOSIS — N186 End stage renal disease: Secondary | ICD-10-CM | POA: Diagnosis not present

## 2021-06-24 DIAGNOSIS — Z992 Dependence on renal dialysis: Secondary | ICD-10-CM | POA: Diagnosis not present

## 2021-06-24 DIAGNOSIS — D509 Iron deficiency anemia, unspecified: Secondary | ICD-10-CM | POA: Diagnosis not present

## 2021-06-24 DIAGNOSIS — D631 Anemia in chronic kidney disease: Secondary | ICD-10-CM | POA: Diagnosis not present

## 2021-06-25 ENCOUNTER — Non-Acute Institutional Stay (SKILLED_NURSING_FACILITY): Payer: Medicare Other | Admitting: Adult Health

## 2021-06-25 ENCOUNTER — Encounter: Payer: Self-pay | Admitting: Adult Health

## 2021-06-25 DIAGNOSIS — Z992 Dependence on renal dialysis: Secondary | ICD-10-CM | POA: Diagnosis not present

## 2021-06-25 DIAGNOSIS — I120 Hypertensive chronic kidney disease with stage 5 chronic kidney disease or end stage renal disease: Secondary | ICD-10-CM

## 2021-06-25 DIAGNOSIS — I951 Orthostatic hypotension: Secondary | ICD-10-CM

## 2021-06-25 DIAGNOSIS — Z9049 Acquired absence of other specified parts of digestive tract: Secondary | ICD-10-CM | POA: Diagnosis not present

## 2021-06-25 DIAGNOSIS — E785 Hyperlipidemia, unspecified: Secondary | ICD-10-CM

## 2021-06-25 DIAGNOSIS — K219 Gastro-esophageal reflux disease without esophagitis: Secondary | ICD-10-CM

## 2021-06-25 DIAGNOSIS — I700 Atherosclerosis of aorta: Secondary | ICD-10-CM

## 2021-06-25 DIAGNOSIS — E1169 Type 2 diabetes mellitus with other specified complication: Secondary | ICD-10-CM

## 2021-06-25 DIAGNOSIS — F321 Major depressive disorder, single episode, moderate: Secondary | ICD-10-CM | POA: Diagnosis not present

## 2021-06-25 DIAGNOSIS — C189 Malignant neoplasm of colon, unspecified: Secondary | ICD-10-CM | POA: Diagnosis not present

## 2021-06-25 DIAGNOSIS — N186 End stage renal disease: Secondary | ICD-10-CM | POA: Diagnosis not present

## 2021-06-25 DIAGNOSIS — E1122 Type 2 diabetes mellitus with diabetic chronic kidney disease: Secondary | ICD-10-CM

## 2021-06-25 NOTE — Progress Notes (Signed)
Location:  [facility] Room Number: 112-D Place of Service:  SNF (31)   CODE STATUS: FULL CODE  Allergies  Allergen Reactions   No Known Allergies     Chief Complaint  Patient presents with   Annual Exam              HPI:  He is a 75 year old long term resident of this facility being seen for his annual exam. He has had a fistula placed in his left arm and does have a chest access placed for his dialysis. Overall there is no significant change in his status. He continues with dialysis three days per week. There are no reports of uncontrolled pain. There are no reports of changes in appetite; his weight is stable. He continues to be followed for his chronic illnesses including: Aortic atherosclerosis  GERD without esophagitis:. History of colon cancer:  Dyslipidemia associated with type 2 diabetes mellitus  Past Medical History:  Diagnosis Date   Abnormal CT scan, kidney 10/06/2011   Acute pyelonephritis 10/07/2011   Anemia    normocytic   Anxiety    mental retardation   Bladder wall thickening 10/06/2011   BPH (benign prostatic hypertrophy)    Diabetes mellitus    Dialysis patient ([place])    Tuesday, Thursday and Saturday,    DVT of leg (deep venous thrombosis) ([place]) 12/25/2016   Edema     history of lower extremity edema   GERD (gastroesophageal reflux disease)    Heme positive stool    Hydronephrosis    Hyperkalemia    Hyperlipidemia    Hypernatremia    Hypertension    Hypothyroidism    Impaired speech    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[place])    MR (mental retardation)    Muscle weakness    Obstructive uropathy    Perinephric abscess 10/07/2011   Poor historian poor historian   Protein calorie malnutrition ([place])    Pyelonephritis    Renal failure (ARF), acute on chronic (HCC)    Renal insufficiency    chronic history   Sepsis ([place])    Smoking    Uremia    Urinary retention    UTI (lower urinary tract infection) 10/06/2011     Past Surgical History:  Procedure Laterality Date   A/V FISTULAGRAM N/A 08/13/2018   Procedure: A/V FISTULAGRAM - Right Upper;  Surgeon: Elam Dutch, MD;  Location: [facility];  Service: Cardiovascular;  Laterality: N/A;   A/V FISTULAGRAM N/A 11/22/2018   Procedure: A/V FISTULAGRAM - Right Upper;  Surgeon: Waynetta Sandy, MD;  Location: [facility];  Service: Cardiovascular;  Laterality: N/A;   AV FISTULA PLACEMENT Left 07/06/2015   Procedure:  INSERTION LEFT ARM ARTERIOVENOUS GORTEX GRAFT;  Surgeon: Angelia Mould, MD;  Location: [facility];  Service: Vascular;  Laterality: Left;   AV FISTULA PLACEMENT Right 02/26/2016   Procedure: ARTERIOVENOUS (AV) FISTULA CREATION ;  Surgeon: Angelia Mould, MD;  Location: [facility];  Service: Vascular;  Laterality: Right;   AV FISTULA PLACEMENT Right 11/25/2018   Procedure: INSERTION OF ARTERIOVENOUS (AV) ARTEGRAFT RIGHT UPPER ARM;  Surgeon: Waynetta Sandy, MD;  Location: [place];  Service: Vascular;  Laterality: Right;   AV FISTULA PLACEMENT Left 05/28/2021   Procedure: LEFT ARM ARTERIOVENOUS (AV) FISTULA;  Surgeon: Rosetta Posner, MD;  [facility];  Service: Vascular;  Laterality: Left;   [place] REMOVAL Left 10/09/2015   Procedure: REMOVAL OF ARTERIOVENOUS GORETEX  GRAFT ([place]) Evacuation of Lymphocele, Vein Patch angioplasty of brachial artery.;  Surgeon: Angelia Mould, MD;  Location: [place];  Service: Vascular;  Laterality: Left;   [place] Right 02/26/2016   Procedure: Right [place];  Surgeon: Angelia Mould, MD;  Location: [place];  Service: Vascular;  Laterality: Right;   CIRCUMCISION N/A 01/04/2014   Procedure: CIRCUMCISION ADULT (procedure #1);  Surgeon: Marissa Nestle, MD;  [facility];  Service: Urology;  Laterality: N/A;   COLECTOMY N/A 05/04/2017   Procedure: TOTAL COLECTOMY;  Surgeon: Aviva Signs, MD;  [facility];  Service: General;   Laterality: N/A;   COLONOSCOPY N/A 04/27/2017   Procedure: COLONOSCOPY;  Surgeon: Daneil Dolin, MD;  [facility];  Service: Endoscopy;  Laterality: N/A;  245   CYSTOSCOPY W/ RETROGRADES Bilateral 06/29/2015   Procedure: CYSTOSCOPY, DILATION OF URETHRAL STRICTURE WITH BILATERAL RETROGRADE PYELOGRAM,SUPRAPUBIC TUBE CHANGE;  Surgeon: Festus Aloe, MD;  [facility];  Service: Urology;  Laterality: Bilateral;   CYSTOSCOPY WITH URETHRAL DILATATION N/A 12/29/2013   Procedure: CYSTOSCOPY WITH URETHRAL DILATATION;  Surgeon: Marissa Nestle, MD;  [facility];  Service: Urology;  Laterality: N/A;   ESOPHAGOGASTRODUODENOSCOPY N/A 04/27/2017   Procedure: ESOPHAGOGASTRODUODENOSCOPY (EGD);  Surgeon: Daneil Dolin, MD;  [facility];  Service: Endoscopy;  Laterality: N/A;   INSERTION OF DIALYSIS CATHETER Right 11/25/2018   Procedure: INSERTION OF DIALYSIS CATHETER RIGHT INTERNAL JUGULAR;  Surgeon: Waynetta Sandy, MD;  Location: [facility];  Service: Vascular;  Laterality: Right;   IR AV DIALY SHUNT INTRO NEEDLE/INTRACATH INITIAL W/PTA/IMG RIGHT Right 09/07/2018   IR FLUORO GUIDE CV LINE RIGHT  10/16/2020   IR REMOVAL TUN CV CATH W/O FL  01/12/2019   IR THROMBECTOMY AV FISTULA W/THROMBOLYSIS/PTA INC/SHUNT/IMG RIGHT Right 04/26/2018   IR US GUIDE VASC ACCESS RIGHT  04/26/2018   IR US GUIDE VASC ACCESS RIGHT  09/07/2018   IR US GUIDE VASC ACCESS RIGHT  10/16/2020   IR US GUIDE VASC ACCESS RIGHT  10/16/2020   ORIF FEMUR FRACTURE Right 11/22/2016   Procedure: OPEN REDUCTION INTERNAL FIXATION (ORIF) DISTAL FEMUR FRACTURE;  Surgeon: Rod Can, MD;  Location: [facility];  Service: Orthopedics;  Laterality: Right;   PATCH ANGIOPLASTY Right 12/10/2017   Procedure: PATCH ANGIOPLASTY;  Surgeon: Angelia Mould, MD;  Location: [facility];  Service: Vascular;  Laterality: Right;   PERIPHERAL VASCULAR BALLOON ANGIOPLASTY  08/13/2018   Procedure: PERIPHERAL VASCULAR BALLOON ANGIOPLASTY;   Surgeon: Elam Dutch, MD;  Location: [facility];  Service: Cardiovascular;;  right AV fistula    PERIPHERAL VASCULAR BALLOON ANGIOPLASTY  11/22/2018   Procedure: PERIPHERAL VASCULAR BALLOON ANGIOPLASTY;  Surgeon: Waynetta Sandy, MD;  Location: [facility] [facility];  Service: Cardiovascular;;  rt AV fistula   PERIPHERAL VASCULAR CATHETERIZATION N/A 10/08/2015   Procedure: A/V Shuntogram;  Surgeon: Angelia Mould, MD;  Location: [place] [facility];  Service: Cardiovascular;  Laterality: N/A;   THROMBECTOMY W/ EMBOLECTOMY Right 12/10/2017   Procedure: THROMBECTOMY REVISION RIGHT ARM  ARTERIOVENOUS FISTULA;  Surgeon: Angelia Mould, MD;  [facility];  Service: Vascular;  Laterality: Right;   TRANSURETHRAL RESECTION OF PROSTATE N/A 01/04/2014   Procedure: TRANSURETHRAL RESECTION OF THE PROSTATE (TURP) (procedure #2);  Surgeon: Marissa Nestle, MD;  [facility];  Service: Urology;  Laterality: N/A;    Social History   Socioeconomic History   Marital status: Single    Spouse name: Not  on file   Number of children: Not on file   Years of education: Not on file   Highest education level: Not on file  Occupational History   Occupation: retired   Tobacco Use   Smoking status: Never   Smokeless tobacco: Never  Vaping Use   Vaping Use: Never used  Substance and Sexual Activity   Alcohol use: No   Drug use: No   Sexual activity: Not Currently  Other Topics Concern   Not on file  Social History Narrative   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   Long term resident of [facility]    Social Determinants of Health   Financial Resource Strain: Not on file  Food Insecurity: Not on file  Transportation Needs: Not on file  Physical Activity: Not on file  Stress: Not on file  Social Connections: Not on file  Intimate Partner Violence: Not on file   Family History  Problem Relation Age of Onset   Cancer Mother     Colon cancer Neg Hx       VITAL SIGNS BP 117/74   Pulse 74   Temp (!) 97.4 F (36.3 C)   Resp 20   Ht 5\' 8"  (1.727 m)   Wt 215 lb 6.4 oz (97.7 kg)   SpO2 99%   BMI 32.75 kg/m   Outpatient Encounter Medications as of 06/25/2021  Medication Sig   aspirin EC 81 MG tablet Take 81 mg by mouth daily. for elevated d-dimer   atorvastatin (LIPITOR) 80 MG tablet Take 80 mg by mouth every evening.   insulin aspart (NOVOLOG) 100 UNIT/ML FlexPen Inject 15-20 Units into the skin See admin instructions. Give 15 units with breakfast and lunch and give 20 units with evening meal (Hold if CBG <150)   Insulin Glargine (BASAGLAR KWIKPEN) 100 UNIT/ML SOPN Inject 45 Units into the skin at bedtime.    JANUVIA 25 MG tablet Take 25 mg by mouth daily with supper.   latanoprost (XALATAN) 0.005 % ophthalmic solution Place 1 drop into both eyes at bedtime. 9 pm for glaucoma   levothyroxine (SYNTHROID) 125 MCG tablet Take 125 mcg by mouth daily before breakfast.   midodrine (PROAMATINE) 10 MG tablet Take 10 mg by mouth. Send med with resident on Mon/Wed/Fri to dialysis. do not give [facility] give to resident to take with him. send with 5mg  to equal 15mg .   midodrine (PROAMATINE) 5 MG tablet Take 5 mg by mouth. Send with resident on dialysis days.  Mon, Wed, Fri.  Take along with 10 mg to equal 15 mg   omega-3 acid ethyl esters (LOVAZA) 1 g capsule Take 2 g by mouth at bedtime.   omeprazole (PRILOSEC) 40 MG capsule Take 40 mg by mouth daily.    polyethylene glycol (MIRALAX / GLYCOLAX) packet Take 17 g by mouth daily as needed for mild constipation.    sevelamer carbonate (RENVELA) 800 MG tablet Take 2,400 mg by mouth 3 (three) times daily with meals.   tamsulosin (FLOMAX) 0.4 MG CAPS capsule Take 0.4 mg by mouth every evening. Give 30 minutes after a meal   torsemide (DEMADEX) 10 MG tablet Take 50 mg by mouth daily with supper. 50mg  (10mg  tablets x 5)   [DISCONTINUED] levothyroxine (SYNTHROID) 100 MCG tablet  Take 100 mcg by mouth daily before breakfast.   No facility-administered encounter medications on file as of 06/25/2021.     SIGNIFICANT DIAGNOSTIC EXAMS  PREVIOUS   05-02-21: Chest x-ray: dialysis cath  demonstrated on right. No acute cardiopulmonary process  NO NEW EXAMS.    LABS REVIEWED PREVIOUS;  07-04-20: PSA: 2.79  07-19-20: hgb a1c 7.7 09-10-20: wbc 11.8; hgb 12.9; hct 40.4; mcv 108.6 plt 163; glucose 225; bun 30;creat 8.92; k+ 3.8; na++ 138; ca 9.2 10-12-20: chol 76; ldl 64; trig 319; hdl 24; hgb a1c 7.4  10-17-20: wbc 8.2; hgb 11.0; hct 34.3; mcv 108.2 plt 121; glucose 141; bun 51; creat 13.25; k+ 3.9; na++ 138; ca 8.5 mag 2.1  01-28-21: hgb a1c 6.5  03-07-21: tsh 5.008  05-02-21: wbc 3.8; hgb 10.9; hct 33.7; mcv 105.6 plt 106; glucose 105; bun 17; creat 7.94; k+ 3.8; na++ 138; ca 9.1; GFR 7; d-dimer: 0.95; CRP 0.7 06-12-21: tsh 5.269   NO NEW LABS.   Review of Systems  Constitutional:  Negative for malaise/fatigue.  Respiratory:  Negative for cough and shortness of breath.   Cardiovascular:  Negative for chest pain, palpitations and leg swelling.  Gastrointestinal:  Negative for abdominal pain, constipation and heartburn.  Musculoskeletal:  Negative for back pain, joint pain and myalgias.  Skin: Negative.   Neurological:  Negative for dizziness.  Psychiatric/Behavioral:  The patient is not nervous/anxious.     Physical Exam Constitutional:      General: He is not in acute distress.    Appearance: He is well-developed. He is not diaphoretic.  Neck:     Thyroid: No thyromegaly.  Cardiovascular:     Rate and Rhythm: Normal rate and regular rhythm.     Heart sounds: Normal heart sounds.  Pulmonary:     Effort: Pulmonary effort is normal. No respiratory distress.     Breath sounds: Normal breath sounds.  Abdominal:     General: Bowel sounds are normal. There is no distension.     Palpations: Abdomen is soft.     Tenderness: There is no abdominal tenderness.   Genitourinary:    Comments: Hx turp  Musculoskeletal:     Cervical back: Neck supple.     Right lower leg: No edema.     Left lower leg: No edema.     Comments:  History of right femur ORIF   Lymphadenopathy:     Cervical: No cervical adenopathy.  Skin:    General: Skin is warm and dry.     Comments: Chest wall dialysis access Left arm fistula + thrill + bruit  Neurological:     Mental Status: He is alert. Mental status is at baseline.  Psychiatric:        Mood and Affect: Mood normal.      ASSESSMENT/ PLAN:  TODAY  Aortic atherosclerosis (ct 2019) will monitor   2. GERD without esophagitis: is stable will continue prilosec 40 mg daily  3. History of colon cancer: is stable is status post colectomy 05-04-17  4. Dyslipidemia associated with type 2 diabetes mellitus: LDL 64; trig 319; will continue lipitor 80 mg daily and lovaza 2 gm daily   PREVIOUS   5. 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: is stable will continue dialysis three days per week. Is followed by nephrology. Will continue midodrine 15 mg with dialysis 1200 cc fluid restriction; renvela 1600 mg with meals and 800 mg with snacks  6. Other specified hypothyroidism: is stable tsh 5.269  will continue synthroid 125 mcg daily follow up lab due in October   7. 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 Major depression single episode moderate: is stable is off medications will monitor   9. Type 2 diabetes mellitus with hypertension and end stage renal disease is stable hgb a1c 7.2; will continue januvia 25 mg daily basaglar 40 units nightly novolog 10 units with breakfast and lunch; 20 units with lunch  10. Bilateral lower extremity edema: is stable will continue demadex 50 mg daily   11. orthostatic hypotension: is stable b/p 117/74 will continue midodrine 15 mg with dialysis    Will check hgb a1c GI consult  for follow up colon cancer   Ok Edwards NP [facility]  Contact [phone] Monday through Friday 8am- 5pm  After hours call [phone]

## 2021-06-26 DIAGNOSIS — M6281 Muscle weakness (generalized): Secondary | ICD-10-CM | POA: Diagnosis not present

## 2021-06-26 DIAGNOSIS — I120 Hypertensive chronic kidney disease with stage 5 chronic kidney disease or end stage renal disease: Secondary | ICD-10-CM | POA: Diagnosis not present

## 2021-06-26 DIAGNOSIS — Z992 Dependence on renal dialysis: Secondary | ICD-10-CM | POA: Diagnosis not present

## 2021-06-26 DIAGNOSIS — N2581 Secondary hyperparathyroidism of renal origin: Secondary | ICD-10-CM | POA: Diagnosis not present

## 2021-06-26 DIAGNOSIS — D509 Iron deficiency anemia, unspecified: Secondary | ICD-10-CM | POA: Diagnosis not present

## 2021-06-26 DIAGNOSIS — R262 Difficulty in walking, not elsewhere classified: Secondary | ICD-10-CM | POA: Diagnosis not present

## 2021-06-26 DIAGNOSIS — N186 End stage renal disease: Secondary | ICD-10-CM | POA: Diagnosis not present

## 2021-06-26 DIAGNOSIS — D631 Anemia in chronic kidney disease: Secondary | ICD-10-CM | POA: Diagnosis not present

## 2021-06-27 ENCOUNTER — Encounter (HOSPITAL_COMMUNITY)
Admission: RE | Admit: 2021-06-27 | Discharge: 2021-06-27 | Disposition: A | Discharge status: Home / Self Care | Payer: Medicare Other | Source: Skilled Nursing Facility | Attending: Adult Health | Admitting: Adult Health

## 2021-06-27 DIAGNOSIS — Z992 Dependence on renal dialysis: Secondary | ICD-10-CM | POA: Diagnosis not present

## 2021-06-27 DIAGNOSIS — M6281 Muscle weakness (generalized): Secondary | ICD-10-CM | POA: Diagnosis not present

## 2021-06-27 DIAGNOSIS — E1129 Type 2 diabetes mellitus with other diabetic kidney complication: Secondary | ICD-10-CM | POA: Diagnosis not present

## 2021-06-27 DIAGNOSIS — I120 Hypertensive chronic kidney disease with stage 5 chronic kidney disease or end stage renal disease: Secondary | ICD-10-CM | POA: Diagnosis not present

## 2021-06-27 DIAGNOSIS — R262 Difficulty in walking, not elsewhere classified: Secondary | ICD-10-CM | POA: Diagnosis not present

## 2021-06-27 LAB — HEMOGLOBIN A1C
Hgb A1c MFr Bld: 5.6 % (ref 4.8–5.6)
Mean Plasma Glucose: 114.02 mg/dL

## 2021-06-28 DIAGNOSIS — M6281 Muscle weakness (generalized): Secondary | ICD-10-CM | POA: Diagnosis not present

## 2021-06-28 DIAGNOSIS — D509 Iron deficiency anemia, unspecified: Secondary | ICD-10-CM | POA: Diagnosis not present

## 2021-06-28 DIAGNOSIS — Z992 Dependence on renal dialysis: Secondary | ICD-10-CM | POA: Diagnosis not present

## 2021-06-28 DIAGNOSIS — D631 Anemia in chronic kidney disease: Secondary | ICD-10-CM | POA: Diagnosis not present

## 2021-06-28 DIAGNOSIS — I120 Hypertensive chronic kidney disease with stage 5 chronic kidney disease or end stage renal disease: Secondary | ICD-10-CM | POA: Diagnosis not present

## 2021-06-28 DIAGNOSIS — N186 End stage renal disease: Secondary | ICD-10-CM | POA: Diagnosis not present

## 2021-06-28 DIAGNOSIS — R262 Difficulty in walking, not elsewhere classified: Secondary | ICD-10-CM | POA: Diagnosis not present

## 2021-07-01 DIAGNOSIS — D509 Iron deficiency anemia, unspecified: Secondary | ICD-10-CM | POA: Diagnosis not present

## 2021-07-01 DIAGNOSIS — Z992 Dependence on renal dialysis: Secondary | ICD-10-CM | POA: Diagnosis not present

## 2021-07-01 DIAGNOSIS — N186 End stage renal disease: Secondary | ICD-10-CM | POA: Diagnosis not present

## 2021-07-01 DIAGNOSIS — D631 Anemia in chronic kidney disease: Secondary | ICD-10-CM | POA: Diagnosis not present

## 2021-07-02 DIAGNOSIS — M6281 Muscle weakness (generalized): Secondary | ICD-10-CM | POA: Diagnosis not present

## 2021-07-02 DIAGNOSIS — Z992 Dependence on renal dialysis: Secondary | ICD-10-CM | POA: Diagnosis not present

## 2021-07-02 DIAGNOSIS — R262 Difficulty in walking, not elsewhere classified: Secondary | ICD-10-CM | POA: Diagnosis not present

## 2021-07-02 DIAGNOSIS — I120 Hypertensive chronic kidney disease with stage 5 chronic kidney disease or end stage renal disease: Secondary | ICD-10-CM | POA: Diagnosis not present

## 2021-07-03 DIAGNOSIS — D509 Iron deficiency anemia, unspecified: Secondary | ICD-10-CM | POA: Diagnosis not present

## 2021-07-03 DIAGNOSIS — I120 Hypertensive chronic kidney disease with stage 5 chronic kidney disease or end stage renal disease: Secondary | ICD-10-CM | POA: Diagnosis not present

## 2021-07-03 DIAGNOSIS — M6281 Muscle weakness (generalized): Secondary | ICD-10-CM | POA: Diagnosis not present

## 2021-07-03 DIAGNOSIS — Z992 Dependence on renal dialysis: Secondary | ICD-10-CM | POA: Diagnosis not present

## 2021-07-03 DIAGNOSIS — R262 Difficulty in walking, not elsewhere classified: Secondary | ICD-10-CM | POA: Diagnosis not present

## 2021-07-03 DIAGNOSIS — D631 Anemia in chronic kidney disease: Secondary | ICD-10-CM | POA: Diagnosis not present

## 2021-07-03 DIAGNOSIS — N186 End stage renal disease: Secondary | ICD-10-CM | POA: Diagnosis not present

## 2021-07-04 ENCOUNTER — Non-Acute Institutional Stay (SKILLED_NURSING_FACILITY): Payer: Medicare Other | Admitting: Adult Health

## 2021-07-04 ENCOUNTER — Encounter: Payer: Self-pay | Admitting: Adult Health

## 2021-07-04 ENCOUNTER — Encounter: Payer: Self-pay | Admitting: Internal Medicine

## 2021-07-04 DIAGNOSIS — I700 Atherosclerosis of aorta: Secondary | ICD-10-CM

## 2021-07-04 DIAGNOSIS — Z992 Dependence on renal dialysis: Secondary | ICD-10-CM | POA: Diagnosis not present

## 2021-07-04 DIAGNOSIS — E1122 Type 2 diabetes mellitus with diabetic chronic kidney disease: Secondary | ICD-10-CM

## 2021-07-04 DIAGNOSIS — N186 End stage renal disease: Secondary | ICD-10-CM

## 2021-07-04 DIAGNOSIS — R262 Difficulty in walking, not elsewhere classified: Secondary | ICD-10-CM | POA: Diagnosis not present

## 2021-07-04 DIAGNOSIS — M6281 Muscle weakness (generalized): Secondary | ICD-10-CM | POA: Diagnosis not present

## 2021-07-04 DIAGNOSIS — I120 Hypertensive chronic kidney disease with stage 5 chronic kidney disease or end stage renal disease: Secondary | ICD-10-CM | POA: Diagnosis not present

## 2021-07-04 NOTE — Progress Notes (Signed)
Location:  [place] Room Number: 112 Place of Service:  SNF (31)   CODE STATUS: full code   Allergies  Allergen Reactions   No Known Allergies     Chief Complaint  Patient presents with   Acute Visit    Care plan meeting     HPI:  We have come together for his care plan meeting.  BIMS 8/15 mood 0/30. He is wheelchair bound. He is independent to supervision with his adls. He is continent of bladder and bowel. He is able to feed himself. There have been no falls. Dietary weight is 215.4 pounds loss of 2.6 pounds usual body weight 2111-217 pounds diet is: NAS/consistent CHO with 1200 cc fluid restrictions.   Therapy PT: has left knee pain. Ambulation and strengthening.   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   Type 2 diabetes mellitus with hypertension and end stage renal disease on dialysis   Past Medical History:  Diagnosis Date   Abnormal CT scan, kidney 10/06/2011   Acute pyelonephritis 10/07/2011   Anemia    normocytic   Anxiety    mental retardation   Bladder wall thickening 10/06/2011   BPH (benign prostatic hypertrophy)    Diabetes mellitus    Dialysis patient ([place])    Tuesday, Thursday and Saturday,    DVT of leg (deep venous thrombosis) ([place]) 12/25/2016   Edema     history of lower extremity edema   GERD (gastroesophageal reflux disease)    Heme positive stool    Hydronephrosis    Hyperkalemia    Hyperlipidemia    Hypernatremia    Hypertension    Hypothyroidism    Impaired speech    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HCC)    MR (mental retardation)    Muscle weakness    Obstructive uropathy    Perinephric abscess 10/07/2011   Poor historian poor historian   Protein calorie malnutrition ([place])    Pyelonephritis    Renal failure (ARF), acute on chronic (HCC)    Renal insufficiency    chronic  history   Sepsis ([place])    Smoking    Uremia    Urinary retention    UTI (lower urinary tract infection) 10/06/2011    Past Surgical History:  Procedure Laterality Date   A/V FISTULAGRAM N/A 08/13/2018   Procedure: A/V FISTULAGRAM - Right Upper;  Surgeon: Elam Dutch, MD;  Location: [place] [facility];  Service: Cardiovascular;  Laterality: N/A;   A/V FISTULAGRAM N/A 11/22/2018   Procedure: A/V FISTULAGRAM - Right Upper;  Surgeon: Waynetta Sandy, MD;  Location: [facility] [facility];  Service: Cardiovascular;  Laterality: N/A;   AV FISTULA PLACEMENT Left 07/06/2015   Procedure:  INSERTION LEFT ARM ARTERIOVENOUS GORTEX GRAFT;  Surgeon: Angelia Mould, MD;  Location: [place];  Service: Vascular;  Laterality: Left;   AV FISTULA PLACEMENT Right 02/26/2016   Procedure: ARTERIOVENOUS (AV) FISTULA CREATION ;  Surgeon: Angelia Mould, MD;  Location: [place];  Service: Vascular;  Laterality: Right;   AV FISTULA PLACEMENT Right 11/25/2018   Procedure: INSERTION OF ARTERIOVENOUS (AV) ARTEGRAFT RIGHT UPPER ARM;  Surgeon: Waynetta Sandy, MD;  Location: [place];  Service: Vascular;  Laterality: Right;   AV FISTULA PLACEMENT Left 05/28/2021   Procedure: LEFT ARM ARTERIOVENOUS (AV) FISTULA;  Surgeon: Rosetta Posner, MD;  [facility];  Service: Vascular;  Laterality: Left;   Regino Ramirez Left 10/09/2015   Procedure: REMOVAL OF ARTERIOVENOUS GORETEX GRAFT ([place]) Evacuation of Lymphocele, Vein Patch angioplasty of brachial artery.;  Surgeon: Angelia Mould, MD;  Location: [place];  Service: Vascular;  Laterality: Left;   [place] Right 02/26/2016   Procedure: Right [place];  Surgeon: Angelia Mould, MD;  Location: [place];  Service: Vascular;  Laterality: Right;   CIRCUMCISION N/A 01/04/2014   Procedure: CIRCUMCISION ADULT (procedure #1);  Surgeon: Marissa Nestle, MD;  Location: [facility];  Service: Urology;  Laterality: N/A;    COLECTOMY N/A 05/04/2017   Procedure: TOTAL COLECTOMY;  Surgeon: Aviva Signs, MD;  Location: [facility];  Service: General;  Laterality: N/A;   COLONOSCOPY N/A 04/27/2017   Procedure: COLONOSCOPY;  Surgeon: Daneil Dolin, MD;  [facility];  Service: Endoscopy;  Laterality: N/A;  245   CYSTOSCOPY W/ RETROGRADES Bilateral 06/29/2015   Procedure: CYSTOSCOPY, DILATION OF URETHRAL STRICTURE WITH BILATERAL RETROGRADE PYELOGRAM,SUPRAPUBIC TUBE CHANGE;  Surgeon: Festus Aloe, MD;  Location: [facility];  Service: Urology;  Laterality: Bilateral;   CYSTOSCOPY WITH URETHRAL DILATATION N/A 12/29/2013   Procedure: CYSTOSCOPY WITH URETHRAL DILATATION;  Surgeon: Marissa Nestle, MD;  [facility];  Service: Urology;  Laterality: N/A;   ESOPHAGOGASTRODUODENOSCOPY N/A 04/27/2017   Procedure: ESOPHAGOGASTRODUODENOSCOPY (EGD);  Surgeon: Daneil Dolin, MD;  [facility];  Service: Endoscopy;  Laterality: N/A;   INSERTION OF DIALYSIS CATHETER Right 11/25/2018   Procedure: INSERTION OF DIALYSIS CATHETER RIGHT INTERNAL JUGULAR;  Surgeon: Waynetta Sandy, MD;  Location: [facility];  Service: Vascular;  Laterality: Right;   IR AV DIALY SHUNT INTRO NEEDLE/INTRACATH INITIAL W/PTA/IMG RIGHT Right 09/07/2018   IR FLUORO GUIDE CV LINE RIGHT  10/16/2020   IR REMOVAL TUN CV CATH W/O FL  01/12/2019   IR THROMBECTOMY AV FISTULA W/THROMBOLYSIS/PTA INC/SHUNT/IMG RIGHT Right 04/26/2018   IR US GUIDE VASC ACCESS RIGHT  04/26/2018   IR US GUIDE VASC ACCESS RIGHT  09/07/2018   IR US GUIDE VASC ACCESS RIGHT  10/16/2020   IR US GUIDE VASC ACCESS RIGHT  10/16/2020   ORIF FEMUR FRACTURE Right 11/22/2016   Procedure: OPEN REDUCTION INTERNAL FIXATION (ORIF) DISTAL FEMUR FRACTURE;  Surgeon: Rod Can, MD;  Location: [place];  Service: Orthopedics;  Laterality: Right;   PATCH ANGIOPLASTY Right 12/10/2017   Procedure: PATCH ANGIOPLASTY;  Surgeon: Angelia Mould, MD;  Location: [place];  Service: Vascular;   Laterality: Right;   PERIPHERAL VASCULAR BALLOON ANGIOPLASTY  08/13/2018   Procedure: PERIPHERAL VASCULAR BALLOON ANGIOPLASTY;  Surgeon: Elam Dutch, MD;  Location: [facility];  Service: Cardiovascular;;  right AV fistula    PERIPHERAL VASCULAR BALLOON ANGIOPLASTY  11/22/2018   Procedure: PERIPHERAL VASCULAR BALLOON ANGIOPLASTY;  Surgeon: Waynetta Sandy, MD;  Location: [facility];  Service: Cardiovascular;;  rt AV fistula   PERIPHERAL VASCULAR CATHETERIZATION N/A 10/08/2015   Procedure: A/V Shuntogram;  Surgeon: Angelia Mould, MD;  Location: [facility];  Service: Cardiovascular;  Laterality: N/A;   THROMBECTOMY W/ EMBOLECTOMY Right 12/10/2017   Procedure: THROMBECTOMY REVISION RIGHT ARM  ARTERIOVENOUS FISTULA;  Surgeon: Angelia Mould, MD;  [facility]: [facility];  Service: Vascular;  Laterality: Right;   TRANSURETHRAL RESECTION OF PROSTATE N/A 01/04/2014   Procedure: TRANSURETHRAL RESECTION OF THE PROSTATE (TURP) (procedure #2);  Surgeon: Marissa Nestle, MD;  [facility];  Service: Urology;  Laterality:  N/A;    Social History   Socioeconomic History   Marital status: Single    Spouse name: Not on file   Number of children: Not on file   Years of education: Not on file   Highest education level: Not on file  Occupational History   Occupation: retired   Tobacco Use   Smoking status: Never   Smokeless tobacco: Never  Vaping Use   Vaping Use: Never used  Substance and Sexual Activity   Alcohol use: No   Drug use: No   Sexual activity: Not Currently  Other Topics Concern   Not on file  Social History Narrative   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   Long term resident of [facility]    Social Determinants of Health   Financial Resource Strain: Not on file  Food Insecurity: Not on file  Transportation Needs: Not on file  Physical Activity: Not on file  Stress: Not on file  Social  Connections: Not on file  Intimate Partner Violence: Not on file   Family History  Problem Relation Age of Onset   Cancer Mother    Colon cancer Neg Hx       VITAL SIGNS BP (!) 90/50   Pulse 88   Temp 98.1 F (36.7 C)   Ht 5\' 8"  (1.727 m)   Wt 215 lb 12.8 oz (97.9 kg)   BMI 32.81 kg/m   Outpatient Encounter Medications as of 07/04/2021  Medication Sig   aspirin EC 81 MG tablet Take 81 mg by mouth daily. for elevated d-dimer   atorvastatin (LIPITOR) 80 MG tablet Take 80 mg by mouth every evening.   insulin aspart (NOVOLOG) 100 UNIT/ML FlexPen Inject 15-20 Units into the skin See admin instructions. Give 15 units with breakfast and lunch and give 20 units with evening meal (Hold if CBG <150)   Insulin Glargine (BASAGLAR KWIKPEN) 100 UNIT/ML SOPN Inject 45 Units into the skin at bedtime.    JANUVIA 25 MG tablet Take 25 mg by mouth daily with supper.   latanoprost (XALATAN) 0.005 % ophthalmic solution Place 1 drop into both eyes at bedtime. 9 pm for glaucoma   levothyroxine (SYNTHROID) 125 MCG tablet Take 125 mcg by mouth daily before breakfast.   midodrine (PROAMATINE) 10 MG tablet Take 10 mg by mouth. Send med with resident on Mon/Wed/Fri to dialysis. do not give [facility] give to resident to take with him. send with 5mg  to equal 15mg .   midodrine (PROAMATINE) 5 MG tablet Take 5 mg by mouth. Send with resident on dialysis days.  Mon, Wed, Fri.  Take along with 10 mg to equal 15 mg   omega-3 acid ethyl esters (LOVAZA) 1 g capsule Take 2 g by mouth at bedtime.   omeprazole (PRILOSEC) 40 MG capsule Take 40 mg by mouth daily.    polyethylene glycol (MIRALAX / GLYCOLAX) packet Take 17 g by mouth daily as needed for mild constipation.    sevelamer carbonate (RENVELA) 800 MG tablet Take 2,400 mg by mouth 3 (three) times daily with meals.   tamsulosin (FLOMAX) 0.4 MG CAPS capsule Take 0.4 mg by mouth every evening. Give 30 minutes after a meal   torsemide (DEMADEX) 10 MG tablet Take 50  mg by mouth daily with supper. 50mg  (10mg  tablets x 5)   No facility-administered encounter medications on file as of 07/04/2021.     SIGNIFICANT DIAGNOSTIC EXAMS  PREVIOUS   05-02-21: Chest x-ray: dialysis cath demonstrated on right.  No acute cardiopulmonary process  NO NEW EXAMS.    LABS REVIEWED PREVIOUS;  07-04-20: PSA: 2.79  07-19-20: hgb a1c 7.7 09-10-20: wbc 11.8; hgb 12.9; hct 40.4; mcv 108.6 plt 163; glucose 225; bun 30;creat 8.92; k+ 3.8; na++ 138; ca 9.2 10-12-20: chol 76; ldl 64; trig 319; hdl 24; hgb a1c 7.4  10-17-20: wbc 8.2; hgb 11.0; hct 34.3; mcv 108.2 plt 121; glucose 141; bun 51; creat 13.25; k+ 3.9; na++ 138; ca 8.5 mag 2.1  01-28-21: hgb a1c 6.5  03-07-21: tsh 5.008  05-02-21: wbc 3.8; hgb 10.9; hct 33.7; mcv 105.6 plt 106; glucose 105; bun 17; creat 7.94; k+ 3.8; na++ 138; ca 9.1; GFR 7; d-dimer: 0.95; CRP 0.7 06-12-21: tsh 5.269   NO NEW LABS.   Review of Systems  Constitutional:  Negative for malaise/fatigue.  Respiratory:  Negative for cough and shortness of breath.   Cardiovascular:  Negative for chest pain, palpitations and leg swelling.  Gastrointestinal:  Negative for abdominal pain, constipation and heartburn.  Musculoskeletal:  Negative for back pain, joint pain and myalgias.  Skin: Negative.   Neurological:  Negative for dizziness.  Psychiatric/Behavioral:  The patient is not nervous/anxious.    Physical Exam Constitutional:      General: He is not in acute distress.    Appearance: He is well-developed. He is not diaphoretic.  Neck:     Thyroid: No thyromegaly.  Cardiovascular:     Rate and Rhythm: Normal rate and regular rhythm.     Pulses: Normal pulses.     Heart sounds: Normal heart sounds.  Pulmonary:     Effort: Pulmonary effort is normal. No respiratory distress.     Breath sounds: Normal breath sounds.  Abdominal:     General: Bowel sounds are normal. There is no distension.     Palpations: Abdomen is soft.     Tenderness: There is no  abdominal tenderness.  Genitourinary:    Comments: Hx turp  Musculoskeletal:        General: Normal range of motion.     Cervical back: Neck supple.     Right lower leg: No edema.     Left lower leg: No edema.     Comments: History of right femur ORIF  Lymphadenopathy:     Cervical: No cervical adenopathy.  Skin:    General: Skin is warm and dry.     Comments:  Chest wall dialysis access Left arm fistula + thrill + bruit   Neurological:     Mental Status: He is alert. Mental status is at baseline.  Psychiatric:        Mood and Affect: Mood normal.       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    Time spent with patient: 40 minutes: medications; care plan; goals of care.    Ok Edwards NP [facility]  Contact [phone] Monday through Friday 8am- 5pm  After hours call [phone]

## 2021-07-05 DIAGNOSIS — M6281 Muscle weakness (generalized): Secondary | ICD-10-CM | POA: Diagnosis not present

## 2021-07-05 DIAGNOSIS — D509 Iron deficiency anemia, unspecified: Secondary | ICD-10-CM | POA: Diagnosis not present

## 2021-07-05 DIAGNOSIS — Z992 Dependence on renal dialysis: Secondary | ICD-10-CM | POA: Diagnosis not present

## 2021-07-05 DIAGNOSIS — R262 Difficulty in walking, not elsewhere classified: Secondary | ICD-10-CM | POA: Diagnosis not present

## 2021-07-05 DIAGNOSIS — I120 Hypertensive chronic kidney disease with stage 5 chronic kidney disease or end stage renal disease: Secondary | ICD-10-CM | POA: Diagnosis not present

## 2021-07-05 DIAGNOSIS — D631 Anemia in chronic kidney disease: Secondary | ICD-10-CM | POA: Diagnosis not present

## 2021-07-05 DIAGNOSIS — N186 End stage renal disease: Secondary | ICD-10-CM | POA: Diagnosis not present

## 2021-07-06 DIAGNOSIS — R262 Difficulty in walking, not elsewhere classified: Secondary | ICD-10-CM | POA: Diagnosis not present

## 2021-07-06 DIAGNOSIS — I120 Hypertensive chronic kidney disease with stage 5 chronic kidney disease or end stage renal disease: Secondary | ICD-10-CM | POA: Diagnosis not present

## 2021-07-06 DIAGNOSIS — M6281 Muscle weakness (generalized): Secondary | ICD-10-CM | POA: Diagnosis not present

## 2021-07-06 DIAGNOSIS — Z992 Dependence on renal dialysis: Secondary | ICD-10-CM | POA: Diagnosis not present

## 2021-07-08 DIAGNOSIS — Z992 Dependence on renal dialysis: Secondary | ICD-10-CM | POA: Diagnosis not present

## 2021-07-08 DIAGNOSIS — M6281 Muscle weakness (generalized): Secondary | ICD-10-CM | POA: Diagnosis not present

## 2021-07-08 DIAGNOSIS — R262 Difficulty in walking, not elsewhere classified: Secondary | ICD-10-CM | POA: Diagnosis not present

## 2021-07-08 DIAGNOSIS — D631 Anemia in chronic kidney disease: Secondary | ICD-10-CM | POA: Diagnosis not present

## 2021-07-08 DIAGNOSIS — N186 End stage renal disease: Secondary | ICD-10-CM | POA: Diagnosis not present

## 2021-07-08 DIAGNOSIS — I120 Hypertensive chronic kidney disease with stage 5 chronic kidney disease or end stage renal disease: Secondary | ICD-10-CM | POA: Diagnosis not present

## 2021-07-08 DIAGNOSIS — D509 Iron deficiency anemia, unspecified: Secondary | ICD-10-CM | POA: Diagnosis not present

## 2021-07-09 DIAGNOSIS — I120 Hypertensive chronic kidney disease with stage 5 chronic kidney disease or end stage renal disease: Secondary | ICD-10-CM | POA: Diagnosis not present

## 2021-07-09 DIAGNOSIS — Z992 Dependence on renal dialysis: Secondary | ICD-10-CM | POA: Diagnosis not present

## 2021-07-09 DIAGNOSIS — R262 Difficulty in walking, not elsewhere classified: Secondary | ICD-10-CM | POA: Diagnosis not present

## 2021-07-09 DIAGNOSIS — M6281 Muscle weakness (generalized): Secondary | ICD-10-CM | POA: Diagnosis not present

## 2021-07-10 DIAGNOSIS — D631 Anemia in chronic kidney disease: Secondary | ICD-10-CM | POA: Diagnosis not present

## 2021-07-10 DIAGNOSIS — Z992 Dependence on renal dialysis: Secondary | ICD-10-CM | POA: Diagnosis not present

## 2021-07-10 DIAGNOSIS — N186 End stage renal disease: Secondary | ICD-10-CM | POA: Diagnosis not present

## 2021-07-10 DIAGNOSIS — D509 Iron deficiency anemia, unspecified: Secondary | ICD-10-CM | POA: Diagnosis not present

## 2021-07-11 DIAGNOSIS — Z992 Dependence on renal dialysis: Secondary | ICD-10-CM | POA: Diagnosis not present

## 2021-07-11 DIAGNOSIS — I120 Hypertensive chronic kidney disease with stage 5 chronic kidney disease or end stage renal disease: Secondary | ICD-10-CM | POA: Diagnosis not present

## 2021-07-11 DIAGNOSIS — M6281 Muscle weakness (generalized): Secondary | ICD-10-CM | POA: Diagnosis not present

## 2021-07-11 DIAGNOSIS — R262 Difficulty in walking, not elsewhere classified: Secondary | ICD-10-CM | POA: Diagnosis not present

## 2021-07-12 DIAGNOSIS — M6281 Muscle weakness (generalized): Secondary | ICD-10-CM | POA: Diagnosis not present

## 2021-07-12 DIAGNOSIS — R262 Difficulty in walking, not elsewhere classified: Secondary | ICD-10-CM | POA: Diagnosis not present

## 2021-07-12 DIAGNOSIS — D509 Iron deficiency anemia, unspecified: Secondary | ICD-10-CM | POA: Diagnosis not present

## 2021-07-12 DIAGNOSIS — N186 End stage renal disease: Secondary | ICD-10-CM | POA: Diagnosis not present

## 2021-07-12 DIAGNOSIS — D631 Anemia in chronic kidney disease: Secondary | ICD-10-CM | POA: Diagnosis not present

## 2021-07-12 DIAGNOSIS — I120 Hypertensive chronic kidney disease with stage 5 chronic kidney disease or end stage renal disease: Secondary | ICD-10-CM | POA: Diagnosis not present

## 2021-07-12 DIAGNOSIS — Z992 Dependence on renal dialysis: Secondary | ICD-10-CM | POA: Diagnosis not present

## 2021-07-15 DIAGNOSIS — D509 Iron deficiency anemia, unspecified: Secondary | ICD-10-CM | POA: Diagnosis not present

## 2021-07-15 DIAGNOSIS — Z992 Dependence on renal dialysis: Secondary | ICD-10-CM | POA: Diagnosis not present

## 2021-07-15 DIAGNOSIS — R262 Difficulty in walking, not elsewhere classified: Secondary | ICD-10-CM | POA: Diagnosis not present

## 2021-07-15 DIAGNOSIS — I120 Hypertensive chronic kidney disease with stage 5 chronic kidney disease or end stage renal disease: Secondary | ICD-10-CM | POA: Diagnosis not present

## 2021-07-15 DIAGNOSIS — M6281 Muscle weakness (generalized): Secondary | ICD-10-CM | POA: Diagnosis not present

## 2021-07-15 DIAGNOSIS — N186 End stage renal disease: Secondary | ICD-10-CM | POA: Diagnosis not present

## 2021-07-15 DIAGNOSIS — D631 Anemia in chronic kidney disease: Secondary | ICD-10-CM | POA: Diagnosis not present

## 2021-07-16 DIAGNOSIS — R262 Difficulty in walking, not elsewhere classified: Secondary | ICD-10-CM | POA: Diagnosis not present

## 2021-07-16 DIAGNOSIS — Z992 Dependence on renal dialysis: Secondary | ICD-10-CM | POA: Diagnosis not present

## 2021-07-16 DIAGNOSIS — M6281 Muscle weakness (generalized): Secondary | ICD-10-CM | POA: Diagnosis not present

## 2021-07-16 DIAGNOSIS — I120 Hypertensive chronic kidney disease with stage 5 chronic kidney disease or end stage renal disease: Secondary | ICD-10-CM | POA: Diagnosis not present

## 2021-07-17 DIAGNOSIS — Z992 Dependence on renal dialysis: Secondary | ICD-10-CM | POA: Diagnosis not present

## 2021-07-17 DIAGNOSIS — D509 Iron deficiency anemia, unspecified: Secondary | ICD-10-CM | POA: Diagnosis not present

## 2021-07-17 DIAGNOSIS — R262 Difficulty in walking, not elsewhere classified: Secondary | ICD-10-CM | POA: Diagnosis not present

## 2021-07-17 DIAGNOSIS — N186 End stage renal disease: Secondary | ICD-10-CM | POA: Diagnosis not present

## 2021-07-17 DIAGNOSIS — D631 Anemia in chronic kidney disease: Secondary | ICD-10-CM | POA: Diagnosis not present

## 2021-07-17 DIAGNOSIS — I120 Hypertensive chronic kidney disease with stage 5 chronic kidney disease or end stage renal disease: Secondary | ICD-10-CM | POA: Diagnosis not present

## 2021-07-17 DIAGNOSIS — M6281 Muscle weakness (generalized): Secondary | ICD-10-CM | POA: Diagnosis not present

## 2021-07-18 DIAGNOSIS — R262 Difficulty in walking, not elsewhere classified: Secondary | ICD-10-CM | POA: Diagnosis not present

## 2021-07-18 DIAGNOSIS — M6281 Muscle weakness (generalized): Secondary | ICD-10-CM | POA: Diagnosis not present

## 2021-07-18 DIAGNOSIS — Z992 Dependence on renal dialysis: Secondary | ICD-10-CM | POA: Diagnosis not present

## 2021-07-18 DIAGNOSIS — I120 Hypertensive chronic kidney disease with stage 5 chronic kidney disease or end stage renal disease: Secondary | ICD-10-CM | POA: Diagnosis not present

## 2021-07-19 DIAGNOSIS — D509 Iron deficiency anemia, unspecified: Secondary | ICD-10-CM | POA: Diagnosis not present

## 2021-07-19 DIAGNOSIS — Z992 Dependence on renal dialysis: Secondary | ICD-10-CM | POA: Diagnosis not present

## 2021-07-19 DIAGNOSIS — M6281 Muscle weakness (generalized): Secondary | ICD-10-CM | POA: Diagnosis not present

## 2021-07-19 DIAGNOSIS — N186 End stage renal disease: Secondary | ICD-10-CM | POA: Diagnosis not present

## 2021-07-19 DIAGNOSIS — R262 Difficulty in walking, not elsewhere classified: Secondary | ICD-10-CM | POA: Diagnosis not present

## 2021-07-19 DIAGNOSIS — D631 Anemia in chronic kidney disease: Secondary | ICD-10-CM | POA: Diagnosis not present

## 2021-07-19 DIAGNOSIS — I120 Hypertensive chronic kidney disease with stage 5 chronic kidney disease or end stage renal disease: Secondary | ICD-10-CM | POA: Diagnosis not present

## 2021-07-22 DIAGNOSIS — Z992 Dependence on renal dialysis: Secondary | ICD-10-CM | POA: Diagnosis not present

## 2021-07-22 DIAGNOSIS — D631 Anemia in chronic kidney disease: Secondary | ICD-10-CM | POA: Diagnosis not present

## 2021-07-22 DIAGNOSIS — N186 End stage renal disease: Secondary | ICD-10-CM | POA: Diagnosis not present

## 2021-07-22 DIAGNOSIS — D509 Iron deficiency anemia, unspecified: Secondary | ICD-10-CM | POA: Diagnosis not present

## 2021-07-22 DIAGNOSIS — M6281 Muscle weakness (generalized): Secondary | ICD-10-CM | POA: Diagnosis not present

## 2021-07-22 DIAGNOSIS — I120 Hypertensive chronic kidney disease with stage 5 chronic kidney disease or end stage renal disease: Secondary | ICD-10-CM | POA: Diagnosis not present

## 2021-07-22 DIAGNOSIS — R262 Difficulty in walking, not elsewhere classified: Secondary | ICD-10-CM | POA: Diagnosis not present

## 2021-07-23 DIAGNOSIS — M6281 Muscle weakness (generalized): Secondary | ICD-10-CM | POA: Diagnosis not present

## 2021-07-23 DIAGNOSIS — R262 Difficulty in walking, not elsewhere classified: Secondary | ICD-10-CM | POA: Diagnosis not present

## 2021-07-23 DIAGNOSIS — Z992 Dependence on renal dialysis: Secondary | ICD-10-CM | POA: Diagnosis not present

## 2021-07-23 DIAGNOSIS — I120 Hypertensive chronic kidney disease with stage 5 chronic kidney disease or end stage renal disease: Secondary | ICD-10-CM | POA: Diagnosis not present

## 2021-07-24 ENCOUNTER — Inpatient Hospital Stay (INDEPENDENT_AMBULATORY_CARE_PROVIDER_SITE_OTHER): Payer: Medicare Other

## 2021-07-24 ENCOUNTER — Ambulatory Visit (INDEPENDENT_AMBULATORY_CARE_PROVIDER_SITE_OTHER): Payer: Medicare Other | Admitting: Vascular Surgery

## 2021-07-24 ENCOUNTER — Encounter: Payer: Self-pay | Admitting: Vascular Surgery

## 2021-07-24 ENCOUNTER — Other Ambulatory Visit: Payer: Self-pay

## 2021-07-24 VITALS — BP 143/74 | HR 84 | Temp 97.8°F | Resp 18 | Ht 68.0 in | Wt 218.0 lb

## 2021-07-24 DIAGNOSIS — R262 Difficulty in walking, not elsewhere classified: Secondary | ICD-10-CM | POA: Diagnosis not present

## 2021-07-24 DIAGNOSIS — N186 End stage renal disease: Secondary | ICD-10-CM

## 2021-07-24 DIAGNOSIS — M6281 Muscle weakness (generalized): Secondary | ICD-10-CM | POA: Diagnosis not present

## 2021-07-24 DIAGNOSIS — I120 Hypertensive chronic kidney disease with stage 5 chronic kidney disease or end stage renal disease: Secondary | ICD-10-CM | POA: Diagnosis not present

## 2021-07-24 DIAGNOSIS — Z9889 Other specified postprocedural states: Secondary | ICD-10-CM

## 2021-07-24 DIAGNOSIS — D631 Anemia in chronic kidney disease: Secondary | ICD-10-CM | POA: Diagnosis not present

## 2021-07-24 DIAGNOSIS — D509 Iron deficiency anemia, unspecified: Secondary | ICD-10-CM | POA: Diagnosis not present

## 2021-07-24 DIAGNOSIS — Z992 Dependence on renal dialysis: Secondary | ICD-10-CM

## 2021-07-24 DIAGNOSIS — E1122 Type 2 diabetes mellitus with diabetic chronic kidney disease: Secondary | ICD-10-CM

## 2021-07-24 NOTE — H&P (View-Only) (Signed)
   Vascular and Vein Specialist of [place]  Patient name: DRAYDON CLAIRMONT MRN: [id number] DOB: 1946/04/24 Sex: male  REASON FOR VISIT: Follow-up left first stage basilic vein fistula on 7/0/0174  HPI: YAKUB LODES is a 75 y.o. male here today for follow-up.  Had a for stage basilic vein fistula creation on 05/28/2021 as an outpatient at [facility].  He has no steal symptoms.  He has had good healing.  He is dialyzed via a right IJ tunneled catheter and reports no difficulty with this.  No current outpatient medications on file.   No current facility-administered medications for this visit.     PHYSICAL EXAM: Vitals:   07/24/21 1022  BP: (!) 143/74  Pulse: 84  Resp: 18  Temp: 97.8 F (36.6 C)  TempSrc: Temporal  SpO2: 100%  Weight: 218 lb (98.9 kg)  Height: 5\' 8"  (1.727 m)    GENERAL: The patient is a well-nourished male, in no acute distress. The vital signs are documented above. Excellent thrill in his left arm fistula.  Palpable left radial pulse with no evidence of steal  Duplex today shows good flow through the fistula with good size maturation  MEDICAL ISSUES: Discussed plan for second stage transposition.  Explained this would be done as an outpatient at [facility].  He has hemodialysis on Monday Wednesday and Friday so we will plan this for this coming Tuesday 07/30/21.   Rosetta Posner, MD FACS [facility] [phone]  Note: Portions of this report may have been transcribed using voice recognition software.  Every effort has been made to ensure accuracy; however, inadvertent computerized transcription errors may still be present.

## 2021-07-24 NOTE — Progress Notes (Signed)
   Vascular and Vein Specialist of [place]  Patient name: Zachary [name] MRN: [id number] DOB: 02/09/1946 Sex: male  REASON FOR VISIT: Follow-up left first stage basilic vein fistula on 05/29/7289  HPI: Zachary [name] is a 75 y.o. male here today for follow-up.  Had a for stage basilic vein fistula creation on 05/28/2021 as an outpatient at [facility].  He has no steal symptoms.  He has had good healing.  He is dialyzed via a right IJ tunneled catheter and reports no difficulty with this.  No current outpatient medications on file.   No current facility-administered medications for this visit.     PHYSICAL EXAM: Vitals:   07/24/21 1022  BP: (!) 143/74  Pulse: 84  Resp: 18  Temp: 97.8 F (36.6 C)  TempSrc: Temporal  SpO2: 100%  Weight: 218 lb (98.9 kg)  Height: 5\' 8"  (1.727 m)    GENERAL: The patient is a well-nourished male, in no acute distress. The vital signs are documented above. Excellent thrill in his left arm fistula.  Palpable left radial pulse with no evidence of steal  Duplex today shows good flow through the fistula with good size maturation  MEDICAL ISSUES: Discussed plan for second stage transposition.  Explained this would be done as an outpatient at [facility].  He has hemodialysis on Monday Wednesday and Friday so we will plan this for this coming Tuesday 07/30/21.   Rosetta Posner, MD FACS [facility] Special[facility] [phone]  Note: Portions of this report may have been transcribed using voice recognition software.  Every effort has been made to ensure accuracy; however, inadvertent computerized transcription errors may still be present.

## 2021-07-25 ENCOUNTER — Other Ambulatory Visit (HOSPITAL_COMMUNITY): Payer: Self-pay

## 2021-07-25 DIAGNOSIS — I120 Hypertensive chronic kidney disease with stage 5 chronic kidney disease or end stage renal disease: Secondary | ICD-10-CM | POA: Diagnosis not present

## 2021-07-25 DIAGNOSIS — M6281 Muscle weakness (generalized): Secondary | ICD-10-CM | POA: Diagnosis not present

## 2021-07-25 DIAGNOSIS — R262 Difficulty in walking, not elsewhere classified: Secondary | ICD-10-CM | POA: Diagnosis not present

## 2021-07-25 DIAGNOSIS — Z992 Dependence on renal dialysis: Secondary | ICD-10-CM | POA: Diagnosis not present

## 2021-07-26 ENCOUNTER — Encounter (HOSPITAL_COMMUNITY): Payer: Medicare Other | Attending: Vascular Surgery

## 2021-07-26 ENCOUNTER — Non-Acute Institutional Stay: Payer: Self-pay | Admitting: *Deleted

## 2021-07-26 ENCOUNTER — Other Ambulatory Visit: Payer: Self-pay

## 2021-07-26 DIAGNOSIS — N186 End stage renal disease: Secondary | ICD-10-CM | POA: Diagnosis not present

## 2021-07-26 DIAGNOSIS — D631 Anemia in chronic kidney disease: Secondary | ICD-10-CM | POA: Diagnosis not present

## 2021-07-26 DIAGNOSIS — Z992 Dependence on renal dialysis: Secondary | ICD-10-CM | POA: Diagnosis not present

## 2021-07-26 DIAGNOSIS — D509 Iron deficiency anemia, unspecified: Secondary | ICD-10-CM | POA: Diagnosis not present

## 2021-07-26 NOTE — Pre-Procedure Instructions (Signed)
[place] and spoke with Estill Bamberg, RN and went over preop instructions with her. Patient has a legal guardian, Betsey Amen. Attempted to call Betsey Amen about telephone consent and left her a message to call us back.

## 2021-07-29 DIAGNOSIS — D631 Anemia in chronic kidney disease: Secondary | ICD-10-CM | POA: Diagnosis not present

## 2021-07-29 DIAGNOSIS — Z992 Dependence on renal dialysis: Secondary | ICD-10-CM | POA: Diagnosis not present

## 2021-07-29 DIAGNOSIS — N186 End stage renal disease: Secondary | ICD-10-CM | POA: Diagnosis not present

## 2021-07-29 DIAGNOSIS — N2581 Secondary hyperparathyroidism of renal origin: Secondary | ICD-10-CM | POA: Diagnosis not present

## 2021-07-29 DIAGNOSIS — D509 Iron deficiency anemia, unspecified: Secondary | ICD-10-CM | POA: Diagnosis not present

## 2021-07-30 ENCOUNTER — Inpatient Hospital Stay
Admission: RE | Admit: 2021-07-30 | Discharge: 2021-12-02 | Disposition: A | Discharge status: Other Acute Inpatient Hospital | Payer: Medicare Other | Source: Ambulatory Visit | Attending: Internal Medicine | Admitting: Internal Medicine

## 2021-07-30 ENCOUNTER — Ambulatory Visit (HOSPITAL_COMMUNITY): Payer: Medicare Other | Admitting: Certified Registered Nurse Anesthetist

## 2021-07-30 ENCOUNTER — Encounter (HOSPITAL_COMMUNITY): Payer: Self-pay | Admitting: Vascular Surgery

## 2021-07-30 ENCOUNTER — Encounter (HOSPITAL_COMMUNITY)
Admission: RE | Disposition: A | Discharge status: Home / Self Care | Payer: Self-pay | Source: Home / Self Care | Attending: Vascular Surgery

## 2021-07-30 ENCOUNTER — Ambulatory Visit (HOSPITAL_COMMUNITY)
Admission: RE | Admit: 2021-07-30 | Discharge: 2021-07-30 | Disposition: A | Discharge status: Home / Self Care | Payer: Medicare Other | Attending: Vascular Surgery | Admitting: Vascular Surgery

## 2021-07-30 DIAGNOSIS — E1122 Type 2 diabetes mellitus with diabetic chronic kidney disease: Secondary | ICD-10-CM

## 2021-07-30 DIAGNOSIS — Z992 Dependence on renal dialysis: Secondary | ICD-10-CM | POA: Insufficient documentation

## 2021-07-30 DIAGNOSIS — T82898A Other specified complication of vascular prosthetic devices, implants and grafts, initial encounter: Secondary | ICD-10-CM | POA: Diagnosis not present

## 2021-07-30 DIAGNOSIS — Z7984 Long term (current) use of oral hypoglycemic drugs: Secondary | ICD-10-CM | POA: Insufficient documentation

## 2021-07-30 DIAGNOSIS — I120 Hypertensive chronic kidney disease with stage 5 chronic kidney disease or end stage renal disease: Secondary | ICD-10-CM | POA: Diagnosis not present

## 2021-07-30 DIAGNOSIS — N186 End stage renal disease: Secondary | ICD-10-CM | POA: Diagnosis not present

## 2021-07-30 HISTORY — PX: BASCILIC VEIN TRANSPOSITION: SHX5742

## 2021-07-30 LAB — POCT I-STAT, CHEM 8
BUN: 12 mg/dL (ref 8–23)
Calcium, Ion: 0.81 mmol/L — CL (ref 1.15–1.40)
Chloride: 106 mmol/L (ref 98–111)
Creatinine, Ser: 7.3 mg/dL — ABNORMAL HIGH (ref 0.61–1.24)
Glucose, Bld: 104 mg/dL — ABNORMAL HIGH (ref 70–99)
HCT: 34 % — ABNORMAL LOW (ref 39.0–52.0)
Hemoglobin: 11.6 g/dL — ABNORMAL LOW (ref 13.0–17.0)
Potassium: 5.2 mmol/L — ABNORMAL HIGH (ref 3.5–5.1)
Sodium: 135 mmol/L (ref 135–145)
TCO2: 26 mmol/L (ref 22–32)

## 2021-07-30 LAB — GLUCOSE, CAPILLARY: Glucose-Capillary: 104 mg/dL — ABNORMAL HIGH (ref 70–99)

## 2021-07-30 SURGERY — TRANSPOSITION, VEIN, BASILIC
Anesthesia: General | Laterality: Left

## 2021-07-30 MED ORDER — LIDOCAINE HCL (PF) 2 % IJ SOLN
INTRAMUSCULAR | Status: AC
  Filled 2021-07-30: qty 25

## 2021-07-30 MED ORDER — CHLORHEXIDINE GLUCONATE 0.12 % MT SOLN
15.0000 mL | Freq: Once | OROMUCOSAL | Status: AC
  Administered 2021-07-30: 15 mL via OROMUCOSAL

## 2021-07-30 MED ORDER — PHENYLEPHRINE HCL-NACL 20-0.9 MG/250ML-% IV SOLN
INTRAVENOUS | Status: DC | PRN
  Administered 2021-07-30: 40 ug/min via INTRAVENOUS

## 2021-07-30 MED ORDER — ONDANSETRON HCL 4 MG/2ML IJ SOLN
INTRAMUSCULAR | Status: DC | PRN
  Administered 2021-07-30: 4 mg via INTRAVENOUS

## 2021-07-30 MED ORDER — CHLORHEXIDINE GLUCONATE 4 % EX LIQD: 60.0000 mL | Freq: Once | CUTANEOUS | Status: DC

## 2021-07-30 MED ORDER — PHENYLEPHRINE HCL-NACL 20-0.9 MG/250ML-% IV SOLN
INTRAVENOUS | Status: AC
  Filled 2021-07-30: qty 250

## 2021-07-30 MED ORDER — PHENYLEPHRINE 40 MCG/ML (10ML) SYRINGE FOR IV PUSH (FOR BLOOD PRESSURE SUPPORT)
PREFILLED_SYRINGE | INTRAVENOUS | Status: AC
  Filled 2021-07-30: qty 10

## 2021-07-30 MED ORDER — SODIUM CHLORIDE 0.9 % IV SOLN
INTRAVENOUS | Status: DC
  Administered 2021-07-30: 1000 mL via INTRAVENOUS

## 2021-07-30 MED ORDER — OXYCODONE-ACETAMINOPHEN 5-325 MG PO TABS: 1.0000 | ORAL_TABLET | ORAL | 0 refills | Status: DC | PRN

## 2021-07-30 MED ORDER — CEFAZOLIN SODIUM-DEXTROSE 2-4 GM/100ML-% IV SOLN
2.0000 g | INTRAVENOUS | Status: AC
  Administered 2021-07-30: 2 g via INTRAVENOUS

## 2021-07-30 MED ORDER — LIDOCAINE-EPINEPHRINE 0.5 %-1:200000 IJ SOLN
INTRAMUSCULAR | Status: DC | PRN
  Administered 2021-07-30: 30 mL

## 2021-07-30 MED ORDER — LIDOCAINE HCL (CARDIAC) PF 100 MG/5ML IV SOSY
PREFILLED_SYRINGE | INTRAVENOUS | Status: DC | PRN
  Administered 2021-07-30: 80 mg via INTRAVENOUS

## 2021-07-30 MED ORDER — ORAL CARE MOUTH RINSE: 15.0000 mL | Freq: Once | OROMUCOSAL | Status: AC

## 2021-07-30 MED ORDER — LACTATED RINGERS IV SOLN: INTRAVENOUS | Status: DC

## 2021-07-30 MED ORDER — HEPARIN 6000 UNIT IRRIGATION SOLUTION
Status: DC | PRN
  Administered 2021-07-30: 1

## 2021-07-30 MED ORDER — PROPOFOL 10 MG/ML IV BOLUS
INTRAVENOUS | Status: DC | PRN
  Administered 2021-07-30: 150 mg via INTRAVENOUS

## 2021-07-30 MED ORDER — LIDOCAINE HCL (PF) 2 % IJ SOLN
INTRAMUSCULAR | Status: AC
  Filled 2021-07-30: qty 5

## 2021-07-30 MED ORDER — FENTANYL CITRATE (PF) 100 MCG/2ML IJ SOLN
INTRAMUSCULAR | Status: AC
  Filled 2021-07-30: qty 2

## 2021-07-30 MED ORDER — HEPARIN SODIUM (PORCINE) 1000 UNIT/ML IJ SOLN
INTRAMUSCULAR | Status: AC
  Filled 2021-07-30: qty 6

## 2021-07-30 MED ORDER — EPHEDRINE 5 MG/ML INJ
INTRAVENOUS | Status: AC
  Filled 2021-07-30: qty 15

## 2021-07-30 MED ORDER — ONDANSETRON HCL 4 MG/2ML IJ SOLN
INTRAMUSCULAR | Status: AC
  Filled 2021-07-30: qty 2

## 2021-07-30 MED ORDER — ROCURONIUM BROMIDE 10 MG/ML (PF) SYRINGE
PREFILLED_SYRINGE | INTRAVENOUS | Status: AC
  Filled 2021-07-30: qty 10

## 2021-07-30 MED ORDER — LIDOCAINE-EPINEPHRINE 0.5 %-1:200000 IJ SOLN
INTRAMUSCULAR | Status: AC
  Filled 2021-07-30: qty 1

## 2021-07-30 MED ORDER — PROPOFOL 10 MG/ML IV BOLUS
INTRAVENOUS | Status: AC
  Filled 2021-07-30: qty 40

## 2021-07-30 MED ORDER — FENTANYL CITRATE (PF) 250 MCG/5ML IJ SOLN
INTRAMUSCULAR | Status: DC | PRN
  Administered 2021-07-30 (×8): 25 ug via INTRAVENOUS

## 2021-07-30 MED ORDER — CEFAZOLIN SODIUM-DEXTROSE 2-4 GM/100ML-% IV SOLN
INTRAVENOUS | Status: AC
  Filled 2021-07-30: qty 100

## 2021-07-30 MED ORDER — PHENYLEPHRINE HCL (PRESSORS) 10 MG/ML IV SOLN
INTRAVENOUS | Status: DC | PRN
  Administered 2021-07-30 (×3): 80 ug via INTRAVENOUS

## 2021-07-30 MED ORDER — PROMETHAZINE HCL 25 MG/ML IJ SOLN
INTRAMUSCULAR | Status: DC | PRN
  Administered 2021-07-30: 6.25 mg via INTRAVENOUS

## 2021-07-30 MED ORDER — 0.9 % SODIUM CHLORIDE (POUR BTL) OPTIME
TOPICAL | Status: DC | PRN
  Administered 2021-07-30: 1000 mL

## 2021-07-30 MED ORDER — EPHEDRINE SULFATE 50 MG/ML IJ SOLN
INTRAMUSCULAR | Status: DC | PRN
  Administered 2021-07-30 (×3): 5 mg via INTRAVENOUS

## 2021-07-30 MED ORDER — FENTANYL CITRATE PF 50 MCG/ML IJ SOSY: 25.0000 ug | PREFILLED_SYRINGE | INTRAMUSCULAR | Status: DC | PRN

## 2021-07-30 MED ORDER — ONDANSETRON HCL 4 MG/2ML IJ SOLN
4.0000 mg | Freq: Once | INTRAMUSCULAR | Status: AC | PRN
  Administered 2021-07-30: 4 mg via INTRAVENOUS
  Filled 2021-07-30: qty 2

## 2021-07-30 SURGICAL SUPPLY — 45 items
ADH SKN CLS APL DERMABOND .7 (GAUZE/BANDAGES/DRESSINGS) ×2
ARMBAND PINK RESTRICT EXTREMIT (MISCELLANEOUS) ×2 IMPLANT
BAG HAMPER (MISCELLANEOUS) ×2 IMPLANT
BNDG ELASTIC 4X5.8 VLCR STR LF (GAUZE/BANDAGES/DRESSINGS) ×2 IMPLANT
BNDG GAUZE ELAST 4 BULKY (GAUZE/BANDAGES/DRESSINGS) ×2 IMPLANT
CANNULA VESSEL 3MM 2 BLNT TIP (CANNULA) ×2 IMPLANT
CLIP LIGATING EXTRA MED SLVR (CLIP) ×2 IMPLANT
CLIP LIGATING EXTRA SM BLUE (MISCELLANEOUS) ×2 IMPLANT
COVER LIGHT HANDLE STERIS (MISCELLANEOUS) ×4 IMPLANT
COVER MAYO STAND XLG (MISCELLANEOUS) ×2 IMPLANT
COVER PROBE U/S 5X48 (MISCELLANEOUS) IMPLANT
DERMABOND ADVANCED (GAUZE/BANDAGES/DRESSINGS) ×2
DERMABOND ADVANCED .7 DNX12 (GAUZE/BANDAGES/DRESSINGS) ×2 IMPLANT
ELECT REM PT RETURN 9FT ADLT (ELECTROSURGICAL) ×2
ELECTRODE REM PT RTRN 9FT ADLT (ELECTROSURGICAL) ×1 IMPLANT
GAUZE SPONGE 4X4 12PLY STRL (GAUZE/BANDAGES/DRESSINGS) ×2 IMPLANT
GLOVE SS BIOGEL STRL SZ 7.5 (GLOVE) ×1 IMPLANT
GLOVE SUPERSENSE BIOGEL SZ 7.5 (GLOVE) ×1
GLOVE SURG UNDER POLY LF SZ7 (GLOVE) ×6 IMPLANT
GOWN STRL REUS W/TWL LRG LVL3 (GOWN DISPOSABLE) ×6 IMPLANT
IV NS 500ML (IV SOLUTION) ×2
IV NS 500ML BAXH (IV SOLUTION) ×1 IMPLANT
KIT BLADEGUARD II DBL (SET/KITS/TRAYS/PACK) ×2 IMPLANT
KIT TURNOVER KIT A (KITS) ×2 IMPLANT
MANIFOLD NEPTUNE II (INSTRUMENTS) ×4 IMPLANT
MARKER SKIN DUAL TIP RULER LAB (MISCELLANEOUS) ×4 IMPLANT
NEEDLE HYPO 18GX1.5 BLUNT FILL (NEEDLE) ×4 IMPLANT
NS IRRIG 1000ML POUR BTL (IV SOLUTION) ×2 IMPLANT
PACK CV ACCESS (CUSTOM PROCEDURE TRAY) ×2 IMPLANT
PAD ARMBOARD 7.5X6 YLW CONV (MISCELLANEOUS) ×2 IMPLANT
SET BASIN LINEN APH (SET/KITS/TRAYS/PACK) ×2 IMPLANT
SOL PREP POV-IOD 4OZ 10% (MISCELLANEOUS) ×2 IMPLANT
SOL PREP PROV IODINE SCRUB 4OZ (MISCELLANEOUS) ×2 IMPLANT
SPONGE T-LAP 18X18 ~~LOC~~+RFID (SPONGE) ×2 IMPLANT
SUT PROLENE 6 0 CC (SUTURE) ×4 IMPLANT
SUT SILK 2 0 SH (SUTURE) ×2 IMPLANT
SUT SILK 3 0 (SUTURE) ×4
SUT SILK 3-0 18XBRD TIE 12 (SUTURE) ×2 IMPLANT
SUT VIC AB 3-0 SH 27 (SUTURE) ×2
SUT VIC AB 3-0 SH 27X BRD (SUTURE) ×1 IMPLANT
SYR 10ML LL (SYRINGE) ×2 IMPLANT
SYR 50ML LL SCALE MARK (SYRINGE) IMPLANT
SYR BULB IRRIG 60ML STRL (SYRINGE) ×2 IMPLANT
UNDERPAD 30X36 HEAVY ABSORB (UNDERPADS AND DIAPERS) ×2 IMPLANT
WATER STERILE IRR 1000ML POUR (IV SOLUTION) ×2 IMPLANT

## 2021-07-30 NOTE — Op Note (Signed)
    OPERATIVE REPORT  DATE OF SURGERY: 07/30/2021  PATIENT: Zachary [name], 76 y.o. male MRN: [id number]  DOB: August 13, 1946  PRE-OPERATIVE DIAGNOSIS: End-stage renal disease  POST-OPERATIVE DIAGNOSIS:  Same  PROCEDURE: Second stage left basilic vein transposition fistula  SURGEON:  Curt Jews, M.D.  PHYSICIAN ASSISTANT: Nurse  The assistant was needed for exposure and to expedite the case  ANESTHESIA: LMA  EBL: per anesthesia record  Total I/O In: 900 [I.V.:800; IV Piggyback:100] Out: 150 [Blood:150]  BLOOD ADMINISTERED: none  DRAINS: none  SPECIMEN: none  COUNTS CORRECT:  YES  PATIENT DISPOSITION:  PACU - hemodynamically stable  PROCEDURE DETAILS: The patient was taken operating placed supine position where the area of the left arm left axilla were prepped draped you sterile fashion.  SonoSite ultrasound was used to visualize the vein.  The basilic vein joining the deep system just above the elbow and was a very good size through the brachial vein to the axilla.  Incision was made over the old anastomosis and carried down through the scar to isolate the brachial artery to basilic vein anastomosis.  Several additional incisions were made leaving skin bridges in the arm.  The juncture of the basilic vein to the brachial vein was identified and the multiple large tributary branches around this area were ligated and divided.  The brachial vein was then exposed up to the level of the axilla.  Tributary branches were ligated with 3-0 and 4-0 silk ties and divided.  The basilic vein was occluded near the brachial artery anastomosis and the vein was transected.  The vein was brought out through the native tunnel.  The vein was marked to reduce risk of twisting.  The vein was gently dilated and was of excellent caliber.  A subcutaneous tunnel was created from the antecubital incision to the axilla and the vein was brought through the tunnel.  The vein was cut to the appropriate length and  spatulated and sewn into and to the vein cuff at the brachial artery with a running 6-0 Prolene suture.  Clamps removed and excellent thrill was noted.  The wounds were irrigated with saline.  Hemostasis to electrocautery.  The wounds were closed with 3-0 Vicryl in the subcutaneous and subcuticular tissue.  Sterile dressing was applied and the patient was transferred to the recovery room in stable condition.  Patient did have significant amount of general oozing throughout the case and therefore a Curlex and 4 inch Ace were placed on the upper arm.   Rosetta Posner, M.D., [facility] 07/30/2021 11:09 AM  Note: Portions of this report may have been transcribed using voice recognition software.  Every effort has been made to ensure accuracy; however, inadvertent computerized transcription errors may still be present.

## 2021-07-30 NOTE — Transfer of Care (Signed)
Immediate Anesthesia Transfer of Care Note  Patient: Zachary [name]  Procedure(s) Performed: LEFT ARM SECOND STAGE BASILIC VEIN TRANSPOSITION (Left)  Patient Location: PACU  Anesthesia Type:General  Level of Consciousness: awake and alert   Airway & Oxygen Therapy: Patient Spontanous Breathing  Post-op Assessment: Report given to RN and Post -op Vital signs reviewed and stable  Post vital signs: Reviewed and stable  Last Vitals:  Vitals Value Taken Time  BP 143/57 07/30/21 1105  Temp    Pulse 110 07/30/21 1107  Resp 16 07/30/21 1107  SpO2 97 % 07/30/21 1107  Vitals shown include unvalidated device data.  Last Pain:  Vitals:   07/30/21 0735  TempSrc: Oral  PainSc:       Patients Stated Pain Goal: 6 (30/94/07 6808)  Complications: No notable events documented.

## 2021-07-30 NOTE — Anesthesia Procedure Notes (Signed)
Procedure Name: LMA Insertion Date/Time: 07/30/2021 7:42 AM Performed by: Karna Dupes, CRNA Pre-anesthesia Checklist: Patient identified, Emergency Drugs available, Suction available and Patient being monitored Patient Re-evaluated:Patient Re-evaluated prior to induction Oxygen Delivery Method: [place] system utilized Preoxygenation: Pre-oxygenation with 100% oxygen Induction Type: IV induction Ventilation: Mask ventilation without difficulty LMA: LMA inserted LMA Size: 4.0 Number of attempts: 1 Placement Confirmation: positive ETCO2 and breath sounds checked- equal and bilateral Tube secured with: Tape Dental Injury: Teeth and Oropharynx as per pre-operative assessment

## 2021-07-30 NOTE — Discharge Instructions (Signed)
[facility]  Discharge Instructions  AV Fistula or Graft Surgery for Dialysis Access  Please refer to the following instructions for your post-procedure care. Your surgeon or physician assistant will discuss any changes with you.  Activity  You may drive the day following your surgery, if you are comfortable and no longer taking prescription pain medication. Resume full activity as the soreness in your incision resolves.  Bathing/Showering  You may shower after you go home. Keep your incision dry for 48 hours. Do not soak in a bathtub, hot tub, or swim until the incision heals completely. You may not shower if you have a hemodialysis catheter.  Incision Care  Clean your incision with mild soap and water after 48 hours. Pat the area dry with a clean towel. You do not need a bandage unless otherwise instructed. Do not apply any ointments or creams to your incision. You may have skin glue on your incision. Do not peel it off. It will come off on its own in about one week. Your arm may swell a bit after surgery. To reduce swelling use pillows to elevate your arm so it is above your heart. Your doctor will tell you if you need to lightly wrap your arm with an ACE bandage.  Keep the Ace wrap and dressing intact for 48 hours and then can be removed.  Diet  Resume your normal diet. There are not special food restrictions following this procedure. In order to heal from your surgery, it is CRITICAL to get adequate nutrition. Your body requires vitamins, minerals, and protein. Vegetables are the best source of vitamins and minerals. Vegetables also provide the perfect balance of protein. Processed food has little nutritional value, so try to avoid this.  Medications  Resume taking all of your medications. If your incision is causing pain, you may take over-the counter pain relievers such as acetaminophen (Tylenol). If you were prescribed a stronger pain  medication, please be aware these medications can cause nausea and constipation. Prevent nausea by taking the medication with a snack or meal. Avoid constipation by drinking plenty of fluids and eating foods with high amount of fiber, such as fruits, vegetables, and grains.  Do not take Tylenol if you are taking prescription pain medications.  Follow up Your surgeon may want to see you in the office following your access surgery. If so, this will be arranged at the time of your surgery.  Please call us immediately for any of the following conditions:  Increased pain, redness, drainage (pus) from your incision site Fever of 101 degrees or higher Severe or worsening pain at your incision site Hand pain or numbness.  Reduce your risk of vascular disease:  Stop smoking. If you would like help, call [facility] at 1-800-QUIT-NOW 732-446-9929) or [place] at [place] your cholesterol Maintain a desired weight Control your diabetes Keep your blood pressure down  Dialysis  It will take several weeks to several months for your new dialysis access to be ready for use. Your surgeon will determine when it is okay to use it. Your nephrologist will continue to direct your dialysis. You can continue to use your Permcath until your new access is ready for use.   07/30/2021 Zachary [name] [id number] 02/24/1946  Surgeon(s): Venetta Knee, Arvilla Meres, MD  Procedure(s): LEFT ARM SECOND STAGE BASILIC VEIN TRANSPOSITION   May stick graft immediately   May stick graft on designated area only:    Do not stick to  fistula for 6 weeks    If you have any questions, please call the office at [phone].

## 2021-07-30 NOTE — Anesthesia Postprocedure Evaluation (Signed)
Anesthesia Post Note  Patient: Shirlyn Goltz  Procedure(s) Performed: LEFT ARM SECOND STAGE BASILIC VEIN TRANSPOSITION (Left)  Patient location during evaluation: Phase II Anesthesia Type: General Level of consciousness: awake Pain management: pain level controlled Vital Signs Assessment: post-procedure vital signs reviewed and stable Respiratory status: spontaneous breathing and respiratory function stable Cardiovascular status: blood pressure returned to baseline and stable Postop Assessment: no headache and no apparent nausea or vomiting Anesthetic complications: no Comments: Late entry   No notable events documented.   Last Vitals:  Vitals:   07/30/21 1145 07/30/21 1202  BP: (!) 121/44 (!) 152/101  Pulse: (!) 105 (!) 103  Resp: 10 14  Temp:  37.1 C  SpO2: 97% 97%    Last Pain:  Vitals:   07/30/21 1202  TempSrc: Oral  PainSc: 0-No pain                 [name]

## 2021-07-30 NOTE — Anesthesia Preprocedure Evaluation (Signed)
Anesthesia Evaluation  Patient identified by MRN, date of birth, ID band Patient awake    Reviewed: Allergy & Precautions, NPO status , Patient's Chart, lab work & pertinent test results  History of Anesthesia Complications Negative for: history of anesthetic complications  Airway Mallampati: III  TM Distance: >3 FB Neck ROM: Full    Dental  (+) Dental Advisory Given, Poor Dentition, Missing   Pulmonary neg pulmonary ROS,    Pulmonary exam normal breath sounds clear to auscultation       Cardiovascular hypertension, Pt. on medications Normal cardiovascular exam Rhythm:Regular Rate:Normal     Neuro/Psych PSYCHIATRIC DISORDERS Anxiety Depression Mental retardation     GI/Hepatic Neg liver ROS, GERD  Medicated and Controlled,  Endo/Other  diabetes, Well Controlled, Type 2, Insulin Dependent, Oral Hypoglycemic AgentsHypothyroidism   Renal/GU ESRF and DialysisRenal disease     Musculoskeletal   Abdominal   Peds  Hematology  (+) Blood dyscrasia, anemia ,   Anesthesia Other Findings   Reproductive/Obstetrics                             Anesthesia Physical  Anesthesia Plan  ASA: 3  Anesthesia Plan: General   Post-op Pain Management:    Induction: Intravenous  PONV Risk Score and Plan: Propofol infusion  Airway Management Planned: Nasal Cannula, Natural Airway and Simple Face Mask  Additional Equipment:   Intra-op Plan:   Post-operative Plan:   Informed Consent: I have reviewed the patients History and Physical, chart, labs and discussed the procedure including the risks, benefits and alternatives for the proposed anesthesia with the patient or authorized representative who has indicated his/her understanding and acceptance.     Dental advisory given  Plan Discussed with: CRNA and Surgeon  Anesthesia Plan Comments: (Possible GA with LMA)        Anesthesia Quick  Evaluation

## 2021-07-30 NOTE — Interval H&P Note (Signed)
History and Physical Interval Note:  07/30/2021 7:17 AM  Shirlyn Goltz  has presented today for surgery, with the diagnosis of ESRD.  The various methods of treatment have been discussed with the patient and family. After consideration of risks, benefits and other options for treatment, the patient has consented to  Procedure(s): LEFT ARM SECOND STAGE BASILIC VEIN TRANSPOSITION (Left) as a surgical intervention.  [name] history has been reviewed, patient examined, no change in status, stable for surgery.  I have reviewed the patient's chart and labs.  Questions were answered to the patient's satisfaction.     Curt Jews

## 2021-07-31 ENCOUNTER — Encounter (HOSPITAL_COMMUNITY): Payer: Self-pay | Admitting: Vascular Surgery

## 2021-07-31 DIAGNOSIS — D631 Anemia in chronic kidney disease: Secondary | ICD-10-CM | POA: Diagnosis not present

## 2021-07-31 DIAGNOSIS — N2581 Secondary hyperparathyroidism of renal origin: Secondary | ICD-10-CM | POA: Diagnosis not present

## 2021-07-31 DIAGNOSIS — Z992 Dependence on renal dialysis: Secondary | ICD-10-CM | POA: Diagnosis not present

## 2021-07-31 DIAGNOSIS — D509 Iron deficiency anemia, unspecified: Secondary | ICD-10-CM | POA: Diagnosis not present

## 2021-07-31 DIAGNOSIS — N186 End stage renal disease: Secondary | ICD-10-CM | POA: Diagnosis not present

## 2021-07-31 DIAGNOSIS — Z23 Encounter for immunization: Secondary | ICD-10-CM | POA: Diagnosis not present

## 2021-08-02 DIAGNOSIS — D509 Iron deficiency anemia, unspecified: Secondary | ICD-10-CM | POA: Diagnosis not present

## 2021-08-02 DIAGNOSIS — Z992 Dependence on renal dialysis: Secondary | ICD-10-CM | POA: Diagnosis not present

## 2021-08-02 DIAGNOSIS — Z23 Encounter for immunization: Secondary | ICD-10-CM | POA: Diagnosis not present

## 2021-08-02 DIAGNOSIS — N186 End stage renal disease: Secondary | ICD-10-CM | POA: Diagnosis not present

## 2021-08-02 DIAGNOSIS — N2581 Secondary hyperparathyroidism of renal origin: Secondary | ICD-10-CM | POA: Diagnosis not present

## 2021-08-02 DIAGNOSIS — D631 Anemia in chronic kidney disease: Secondary | ICD-10-CM | POA: Diagnosis not present

## 2021-08-05 DIAGNOSIS — N2581 Secondary hyperparathyroidism of renal origin: Secondary | ICD-10-CM | POA: Diagnosis not present

## 2021-08-05 DIAGNOSIS — N186 End stage renal disease: Secondary | ICD-10-CM | POA: Diagnosis not present

## 2021-08-05 DIAGNOSIS — D631 Anemia in chronic kidney disease: Secondary | ICD-10-CM | POA: Diagnosis not present

## 2021-08-05 DIAGNOSIS — E119 Type 2 diabetes mellitus without complications: Secondary | ICD-10-CM | POA: Diagnosis not present

## 2021-08-05 DIAGNOSIS — D509 Iron deficiency anemia, unspecified: Secondary | ICD-10-CM | POA: Diagnosis not present

## 2021-08-05 DIAGNOSIS — Z992 Dependence on renal dialysis: Secondary | ICD-10-CM | POA: Diagnosis not present

## 2021-08-07 ENCOUNTER — Encounter: Payer: Self-pay | Admitting: Adult Health

## 2021-08-07 ENCOUNTER — Non-Acute Institutional Stay (SKILLED_NURSING_FACILITY): Payer: Medicare Other | Admitting: Adult Health

## 2021-08-07 DIAGNOSIS — E11319 Type 2 diabetes mellitus with unspecified diabetic retinopathy without macular edema: Secondary | ICD-10-CM

## 2021-08-07 DIAGNOSIS — D631 Anemia in chronic kidney disease: Secondary | ICD-10-CM | POA: Diagnosis not present

## 2021-08-07 DIAGNOSIS — Z992 Dependence on renal dialysis: Secondary | ICD-10-CM | POA: Diagnosis not present

## 2021-08-07 DIAGNOSIS — Z794 Long term (current) use of insulin: Secondary | ICD-10-CM

## 2021-08-07 DIAGNOSIS — N2581 Secondary hyperparathyroidism of renal origin: Secondary | ICD-10-CM | POA: Diagnosis not present

## 2021-08-07 DIAGNOSIS — N186 End stage renal disease: Secondary | ICD-10-CM | POA: Diagnosis not present

## 2021-08-07 DIAGNOSIS — D509 Iron deficiency anemia, unspecified: Secondary | ICD-10-CM | POA: Diagnosis not present

## 2021-08-07 NOTE — Progress Notes (Signed)
Location:  [facility] Room Number: 112-D Place of Service:  SNF (31)   CODE STATUS: Full code  No Known Allergies  Chief Complaint  Patient presents with   Acute Visit    diabetes     HPI:  He is presently taking basaglar 45 units nightly novolog 5 units with meals for cbg >150; januvia 25 mg daily. AM readings are 106-175; most readings are <150. There are no reports of excessive hunger or thirst.   Past Medical History:  Diagnosis Date   Abnormal CT scan, kidney 10/06/2011   Acute pyelonephritis 10/07/2011   Anemia    normocytic   Anxiety    mental retardation   Bladder wall thickening 10/06/2011   BPH (benign prostatic hypertrophy)    Diabetes mellitus    Dialysis patient ([place])    Tuesday, Thursday and Saturday,    DVT of leg (deep venous thrombosis) ([place]) 12/25/2016   Edema     history of lower extremity edema   GERD (gastroesophageal reflux disease)    Heme positive stool    Hydronephrosis    Hyperkalemia    Hyperlipidemia    Hypernatremia    Hypertension    Hypothyroidism    Impaired speech    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[place])    MR (mental retardation)    Muscle weakness    Obstructive uropathy    Perinephric abscess 10/07/2011   Poor historian poor historian   Protein calorie malnutrition ([place])    Pyelonephritis    Renal failure (ARF), acute on chronic (HCC)    Renal insufficiency    chronic history   Sepsis ([place])    Smoking    Uremia    Urinary retention    UTI (lower urinary tract infection) 10/06/2011    Past Surgical History:  Procedure Laterality Date   A/V FISTULAGRAM N/A 08/13/2018   Procedure: A/V FISTULAGRAM - Right Upper;  Surgeon: Elam Dutch, MD;  Location: [place] [facility];  Service: Cardiovascular;  Laterality: N/A;   A/V FISTULAGRAM N/A 11/22/2018   Procedure: A/V FISTULAGRAM - Right Upper;  Surgeon: Waynetta Sandy, MD;  Location: [place] [facility];  Service: Cardiovascular;   Laterality: N/A;   AV FISTULA PLACEMENT Left 07/06/2015   Procedure:  INSERTION LEFT ARM ARTERIOVENOUS GORTEX GRAFT;  Surgeon: Angelia Mould, MD;  Location: [facility];  Service: Vascular;  Laterality: Left;   AV FISTULA PLACEMENT Right 02/26/2016   Procedure: ARTERIOVENOUS (AV) FISTULA CREATION ;  Surgeon: Angelia Mould, MD;  Location: [facility];  Service: Vascular;  Laterality: Right;   AV FISTULA PLACEMENT Right 11/25/2018   Procedure: INSERTION OF ARTERIOVENOUS (AV) ARTEGRAFT RIGHT UPPER ARM;  Surgeon: Waynetta Sandy, MD;  Location: [facility];  Service: Vascular;  Laterality: Right;   AV FISTULA PLACEMENT Left 05/28/2021   Procedure: LEFT ARM ARTERIOVENOUS (AV) FISTULA;  Surgeon: Rosetta Posner, MD;  Location: [facility];  Service: Vascular;  Laterality: Left;   [place] REMOVAL Left 10/09/2015   Procedure: REMOVAL OF ARTERIOVENOUS GORETEX GRAFT ([place]) Evacuation of Lymphocele, Vein Patch angioplasty of brachial artery.;  Surgeon: Angelia Mould, MD;  Location: [facility];  Service: Vascular;  Laterality: Left;   [place] Right 02/26/2016   Procedure: Right [place];  Surgeon: Angelia Mould, MD;  Location: [facility];  Service: Vascular;  Laterality: Right;   [place] Left 07/30/2021   Procedure: LEFT ARM SECOND STAGE BASILIC VEIN TRANSPOSITION;  Surgeon: Curt Jews  F, MD;  Location: [facility];  Service: Vascular;  Laterality: Left;   CIRCUMCISION N/A 01/04/2014   Procedure: CIRCUMCISION ADULT (procedure #1);  Surgeon: Marissa Nestle, MD;  Location: [facility];  Service: Urology;  Laterality: N/A;   COLECTOMY N/A 05/04/2017   Procedure: TOTAL COLECTOMY;  Surgeon: Aviva Signs, MD;  Location: [facility];  Service: General;  Laterality: N/A;   COLONOSCOPY N/A 04/27/2017   Procedure: COLONOSCOPY;  Surgeon: Daneil Dolin, MD;  [facility];  Service: Endoscopy;  Laterality: N/A;  245   CYSTOSCOPY W/ RETROGRADES Bilateral 06/29/2015    Procedure: CYSTOSCOPY, DILATION OF URETHRAL STRICTURE WITH BILATERAL RETROGRADE PYELOGRAM,SUPRAPUBIC TUBE CHANGE;  Surgeon: Festus Aloe, MD;  Location: [facility];  Service: Urology;  Laterality: Bilateral;   CYSTOSCOPY WITH URETHRAL DILATATION N/A 12/29/2013   Procedure: CYSTOSCOPY WITH URETHRAL DILATATION;  Surgeon: Marissa Nestle, MD;  Location[facility];  Service: Urology;  Laterality: N/A;   ESOPHAGOGASTRODUODENOSCOPY N/A 04/27/2017   Procedure: ESOPHAGOGASTRODUODENOSCOPY (EGD);  Surgeon: Daneil Dolin, MD;  [facility];  Service: Endoscopy;  Laterality: N/A;   INSERTION OF DIALYSIS CATHETER Right 11/25/2018   Procedure: INSERTION OF DIALYSIS CATHETER RIGHT INTERNAL JUGULAR;  Surgeon: Waynetta Sandy, MD;  Location: [facility];  Service: Vascular;  Laterality: Right;   IR AV DIALY SHUNT INTRO NEEDLE/INTRACATH INITIAL W/PTA/IMG RIGHT Right 09/07/2018   IR FLUORO GUIDE CV LINE RIGHT  10/16/2020   IR REMOVAL TUN CV CATH W/O FL  01/12/2019   IR THROMBECTOMY AV FISTULA W/THROMBOLYSIS/PTA INC/SHUNT/IMG RIGHT Right 04/26/2018   IR US GUIDE VASC ACCESS RIGHT  04/26/2018   IR US GUIDE VASC ACCESS RIGHT  09/07/2018   IR US GUIDE VASC ACCESS RIGHT  10/16/2020   IR US GUIDE VASC ACCESS RIGHT  10/16/2020   ORIF FEMUR FRACTURE Right 11/22/2016   Procedure: OPEN REDUCTION INTERNAL FIXATION (ORIF) DISTAL FEMUR FRACTURE;  Surgeon: Rod Can, MD;  Location: [facility];  Service: Orthopedics;  Laterality: Right;   PATCH ANGIOPLASTY Right 12/10/2017   Procedure: PATCH ANGIOPLASTY;  Surgeon: Angelia Mould, MD;  Location: [facility];  Service: Vascular;  Laterality: Right;   PERIPHERAL VASCULAR BALLOON ANGIOPLASTY  08/13/2018   Procedure: PERIPHERAL VASCULAR BALLOON ANGIOPLASTY;  Surgeon: Elam Dutch, MD;  Location: [facility];  Service: Cardiovascular;;  right AV fistula    PERIPHERAL VASCULAR BALLOON ANGIOPLASTY  11/22/2018   Procedure: PERIPHERAL VASCULAR BALLOON ANGIOPLASTY;   Surgeon: Waynetta Sandy, MD;  Location: [facility];  Service: Cardiovascular;;  rt AV fistula   PERIPHERAL VASCULAR CATHETERIZATION N/A 10/08/2015   Procedure: A/V Shuntogram;  Surgeon: Angelia Mould, MD;  Location: [facility] [facility];  Service: Cardiovascular;  Laterality: N/A;   THROMBECTOMY W/ EMBOLECTOMY Right 12/10/2017   Procedure: THROMBECTOMY REVISION RIGHT ARM  ARTERIOVENOUS FISTULA;  Surgeon: Angelia Mould, MD;  Location[facility];  Service: Vascular;  Laterality: Right;   TRANSURETHRAL RESECTION OF PROSTATE N/A 01/04/2014   Procedure: TRANSURETHRAL RESECTION OF THE PROSTATE (TURP) (procedure #2);  Surgeon: Marissa Nestle, MD;  [facility];  Service: Urology;  Laterality: N/A;    Social History   Socioeconomic History   Marital status: Single    Spouse name: Not on file   Number of children: Not on file   Years of education: Not on file   Highest education level: Not on file  Occupational History   Occupation: retired   Tobacco Use   Smoking status: Never   Smokeless  tobacco: Never  Vaping Use   Vaping Use: Never used  Substance and Sexual Activity   Alcohol use: No   Drug use: No   Sexual activity: Not Currently  Other Topics Concern   Not on file  Social History Narrative   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   [facility] resident of [facility]    Social Determinants of Health   Financial Resource Strain: Not on file  Food Insecurity: Not on file  Transportation Needs: Not on file  Physical Activity: Not on file  Stress: Not on file  Social Connections: Not on file  Intimate Partner Violence: Not on file   Family History  Problem Relation Age of Onset   Cancer Mother    Colon cancer Neg Hx       VITAL SIGNS BP 114/68   Pulse 78   Temp (!) 97.3 F (36.3 C)   Resp 20   Ht 5\' 8"  (1.727 m)   Wt 212 lb 6.4 oz (96.3 kg)   SpO2 99%   BMI 32.30 kg/m   Outpatient Encounter  Medications as of 08/07/2021  Medication Sig Note   aspirin EC 81 MG tablet Take 81 mg by mouth daily. 0900 07/25/2021: For elevated D-Dimer   atorvastatin (LIPITOR) 80 MG tablet Take 80 mg by mouth at bedtime. 2100    Insulin Glargine (BASAGLAR KWIKPEN) 100 UNIT/ML SOPN Inject 45 Units into the skin at bedtime. 2100 DO NOT HOLD this insulin without provider notification    latanoprost (XALATAN) 0.005 % ophthalmic solution Place 1 drop into both eyes at bedtime. 2100    levothyroxine (SYNTHROID) 125 MCG tablet Take 125 mcg by mouth daily. 2100    midodrine (PROAMATINE) 10 MG tablet Take 10 mg by mouth See admin instructions. Send med with resident on Mon/Wed/Fri to dialysis. do not give [facility] give to resident to take with him. send with 5mg  to equal 15mg . 11:30    midodrine (PROAMATINE) 5 MG tablet Take 5 mg by mouth See admin instructions. Send with resident on dialysis days.  Mon/Wed/Fri.  Take along with 10 mg to equal 15 mg 11:30    NON FORMULARY Diet:NAS, Cons CHO    omega-3 acid ethyl esters (LOVAZA) 1 g capsule Take 2 g by mouth at bedtime. 2100    omeprazole (PRILOSEC) 40 MG capsule Take 40 mg by mouth daily. 0900    polyethylene glycol (MIRALAX / GLYCOLAX) packet Take 17 g by mouth daily as needed for mild constipation.     sevelamer carbonate (RENVELA) 800 MG tablet Take 2,400 mg by mouth 3 (three) times daily with meals. 0900, 1130, 1800    tamsulosin (FLOMAX) 0.4 MG CAPS capsule Take 0.4 mg by mouth daily at 6 PM. Give 30 minutes after a meal. Do not crush or chew    torsemide (DEMADEX) 10 MG tablet Take 50 mg by mouth daily at 6 PM.    [DISCONTINUED] insulin aspart (NOVOLOG) 100 UNIT/ML FlexPen Inject 15-20 Units into the skin See admin instructions. Inject 15 units into the skin with breakfast and lunch for CBG > 150, inject 20 units at 6 pm if CBG> 150    [DISCONTINUED] JANUVIA 25 MG tablet Take 25 mg by mouth daily at 6 PM.    [DISCONTINUED] oxyCODONE-acetaminophen  (PERCOCET) 5-325 MG tablet Take 1 tablet by mouth every 4 (four) hours as needed for severe pain.    No facility-administered encounter medications on file as of 08/07/2021.  SIGNIFICANT DIAGNOSTIC EXAMS  PREVIOUS   05-02-21: Chest x-ray: dialysis cath demonstrated on right. No acute cardiopulmonary process  NO NEW EXAMS.    LABS REVIEWED PREVIOUS;   09-10-20: wbc 11.8; hgb 12.9; hct 40.4; mcv 108.6 plt 163; glucose 225; bun 30;creat 8.92; k+ 3.8; na++ 138; ca 9.2 10-12-20: chol 76; ldl 64; trig 319; hdl 24; hgb a1c 7.4  10-17-20: wbc 8.2; hgb 11.0; hct 34.3; mcv 108.2 plt 121; glucose 141; bun 51; creat 13.25; k+ 3.9; na++ 138; ca 8.5 mag 2.1  01-28-21: hgb a1c 6.5  03-07-21: tsh 5.008  05-02-21: wbc 3.8; hgb 10.9; hct 33.7; mcv 105.6 plt 106; glucose 105; bun 17; creat 7.94; k+ 3.8; na++ 138; ca 9.1; GFR 7; d-dimer: 0.95; CRP 0.7 06-12-21: tsh 5.269   TODAY  06-27-21: hgb a1c 5.6 07-30-21: hgb 11.6; hct 34.0; glucose 104; bun 12; creat 7.30; k+ 5.2; na++ 135; ionized ca++ 0.8   Review of Systems  Constitutional:  Negative for malaise/fatigue.  Respiratory:  Negative for cough and shortness of breath.   Cardiovascular:  Negative for chest pain, palpitations and leg swelling.  Gastrointestinal:  Negative for abdominal pain, constipation and heartburn.  Musculoskeletal:  Negative for back pain, joint pain and myalgias.  Skin: Negative.   Neurological:  Negative for dizziness.  Psychiatric/Behavioral:  The patient is not nervous/anxious.    Physical Exam Constitutional:      General: He is not in acute distress.    Appearance: He is well-developed. He is not diaphoretic.  Neck:     Thyroid: No thyromegaly.  Cardiovascular:     Rate and Rhythm: Normal rate and regular rhythm.     Pulses: Normal pulses.     Heart sounds: Normal heart sounds.  Pulmonary:     Effort: Pulmonary effort is normal. No respiratory distress.     Breath sounds: Normal breath sounds.  Abdominal:      General: Bowel sounds are normal. There is no distension.     Palpations: Abdomen is soft.     Tenderness: There is no abdominal tenderness.  Genitourinary:    Comments: Hx turp  Musculoskeletal:        General: Normal range of motion.     Cervical back: Neck supple.     Right lower leg: No edema.     Left lower leg: No edema.     Comments: History of right femur ORIF   Lymphadenopathy:     Cervical: No cervical adenopathy.  Skin:    General: Skin is warm and dry.     Comments: Chest wall dialysis access Left arm fistula + thrill + bruit    Neurological:     Mental Status: He is alert. Mental status is at baseline.  Psychiatric:        Mood and Affect: Mood normal.      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    Ok Edwards NP [facility]  Contact [phone] Monday through Friday 8am- 5pm  After hours call [phone]

## 2021-08-09 DIAGNOSIS — D631 Anemia in chronic kidney disease: Secondary | ICD-10-CM | POA: Diagnosis not present

## 2021-08-09 DIAGNOSIS — D509 Iron deficiency anemia, unspecified: Secondary | ICD-10-CM | POA: Diagnosis not present

## 2021-08-09 DIAGNOSIS — Z992 Dependence on renal dialysis: Secondary | ICD-10-CM | POA: Diagnosis not present

## 2021-08-09 DIAGNOSIS — N2581 Secondary hyperparathyroidism of renal origin: Secondary | ICD-10-CM | POA: Diagnosis not present

## 2021-08-09 DIAGNOSIS — N186 End stage renal disease: Secondary | ICD-10-CM | POA: Diagnosis not present

## 2021-08-12 ENCOUNTER — Other Ambulatory Visit (HOSPITAL_COMMUNITY)
Admission: RE | Admit: 2021-08-12 | Discharge: 2021-08-12 | Disposition: A | Discharge status: Home / Self Care | Payer: Medicare Other | Source: Skilled Nursing Facility | Attending: Adult Health | Admitting: Adult Health

## 2021-08-12 DIAGNOSIS — N2581 Secondary hyperparathyroidism of renal origin: Secondary | ICD-10-CM | POA: Diagnosis not present

## 2021-08-12 DIAGNOSIS — N186 End stage renal disease: Secondary | ICD-10-CM | POA: Diagnosis not present

## 2021-08-12 DIAGNOSIS — D631 Anemia in chronic kidney disease: Secondary | ICD-10-CM | POA: Diagnosis not present

## 2021-08-12 DIAGNOSIS — D509 Iron deficiency anemia, unspecified: Secondary | ICD-10-CM | POA: Diagnosis not present

## 2021-08-12 DIAGNOSIS — E039 Hypothyroidism, unspecified: Secondary | ICD-10-CM | POA: Insufficient documentation

## 2021-08-12 DIAGNOSIS — Z992 Dependence on renal dialysis: Secondary | ICD-10-CM | POA: Diagnosis not present

## 2021-08-12 LAB — TSH: TSH: 0.822 u[IU]/mL (ref 0.350–4.500)

## 2021-08-13 ENCOUNTER — Encounter: Payer: Self-pay | Admitting: Internal Medicine

## 2021-08-13 ENCOUNTER — Non-Acute Institutional Stay (SKILLED_NURSING_FACILITY): Payer: Medicare Other | Admitting: Internal Medicine

## 2021-08-13 DIAGNOSIS — D631 Anemia in chronic kidney disease: Secondary | ICD-10-CM | POA: Diagnosis not present

## 2021-08-13 DIAGNOSIS — E034 Atrophy of thyroid (acquired): Secondary | ICD-10-CM

## 2021-08-13 DIAGNOSIS — E11319 Type 2 diabetes mellitus with unspecified diabetic retinopathy without macular edema: Secondary | ICD-10-CM | POA: Diagnosis not present

## 2021-08-13 DIAGNOSIS — N186 End stage renal disease: Secondary | ICD-10-CM | POA: Diagnosis not present

## 2021-08-13 DIAGNOSIS — Z794 Long term (current) use of insulin: Secondary | ICD-10-CM | POA: Diagnosis not present

## 2021-08-13 DIAGNOSIS — Z992 Dependence on renal dialysis: Secondary | ICD-10-CM

## 2021-08-13 DIAGNOSIS — E1122 Type 2 diabetes mellitus with diabetic chronic kidney disease: Secondary | ICD-10-CM

## 2021-08-13 NOTE — Assessment & Plan Note (Signed)
Current A1c is 5.6%.  Discordance between glucoses and A1c is quite likely with the ESRD.

## 2021-08-13 NOTE — Progress Notes (Signed)
NURSING HOME LOCATION: [facility] ROOM NUMBER:  112 D  CODE STATUS:  Full Code  PCP: Ok Edwards NP  This is a nursing facility follow up visit of chronic medical diagnoses & to document compliance with Regulation 483.30 (c) in [employer] Phase 2 which mandates caregiver visit ( visits can alternate among physician, PA or NP as per statutes) within 10 days of 30 days / 60 days/ 90 days post admission to SNF date    Interim medical record and care since last SNF visit was updated with review of diagnostic studies and change in clinical status since last visit were documented. Most recent lab results are summarized & compared to prior results under Current Assessment & Plan in the Problem List.  HPI: He is a permanent resident of the facility with medical diagnoses of dyslipidemia, insulin-dependent diabetes with ESRD on hemodialysis, chronic anemia, BPH, hypothyroidism, GERD, essential hypertension, and history of DVT. Procedures include colectomy for multiple dysplastic polyps in 2018 and multiple vascular procedures related to his ESRD and hemodialysis.  Review of systems: Dysarthria significantly impaired history.  Apparently he was trying to tell me about the vascular procedure he had on 10/4.  Dr. Sherren Mocha Early performed a second stage left basilic vein transposition fistula.  That operative note was reviewed.  Dr. Donnetta Hutching describes significant general oozing throughout the case prompting application of a Kerlix and 4 inch Ace over the upper arm. Staff has applied the dressing and pack as needed for low-grade bleeding from the operative sites since that date.  Constitutional: No fever, significant weight change, fatigue  Eyes: No redness, discharge, pain, vision change ENT/mouth: No nasal congestion,  purulent discharge, earache, change in hearing, sore throat  Cardiovascular: No chest p at the shunt site at the left upper extremity medially but  speech was essentially unintelligible.  He denies other active symptoms.  ain, palpitations, paroxysmal nocturnal dyspnea, claudication, edema  Respiratory: No cough, sputum production, hemoptysis, DOE, significant snoring, apnea   Gastrointestinal: No heartburn, dysphagia, abdominal pain, nausea /vomiting, rectal bleeding, melena, change in bowels Genitourinary: No dysuria, hematuria, pyuria, incontinence, nocturia Musculoskeletal: No joint stiffness, joint swelling, weakness, pain Dermatologic: No rash, pruritus, change in appearance of skin Heme: no other bleeding or bruising beyond that noted above @ LUE op site Neurologic: No dizziness, headache, syncope, seizures, numbness, tingling Psychiatric: No significant anxiety, depression, insomnia, anorexia Endocrine: No change in hair/skin/nails, excessive thirst, excessive hunger Allergy/immunology: No itchy/watery eyes, significant sneezing, urticaria, angioedema  Physical exam:  Pertinent or positive findings: As noted he has profound dysarthria.  He had difficulty following commands.  He has slight alternating exotropia.  He is missing multiple maxillary teeth and has malalignment of the mandibular teeth.  Cath is present at the right chest.  Abdomen is protuberant.  Pedal pulses are decreased.  He has 1/2+ edema at the sock line.  Operative scars are present at the medial aspect of the left upper extremity.  Active shunt is present on the right.  There is discoloration and deformity of the right distal thumbnail.  Strength to opposition is fair in the lower extremities.  General appearance: Adequately nourished; no acute distress, increased work of breathing is present.   Lymphatic: No lymphadenopathy about the head, neck, axilla. Eyes: No conjunctival inflammation or lid edema is present. There is no scleral icterus. Ears:  External ear exam shows no significant lesions or deformities.   Nose:  External nasal examination shows no deformity  or  inflammation. Nasal mucosa are pink and moist without lesions, exudates Neck:  No thyromegaly, masses, tenderness noted.    Heart:  Normal rate and regular rhythm. S1 and S2 normal without gallop, murmur, click, rub .  Lungs: Chest clear to auscultation without wheezes, rhonchi, rales, rubs. Abdomen: Bowel sounds are normal. Abdomen is soft and nontender with no organomegaly, hernias, masses. GU: Deferred  Extremities:  No cyanosis, clubbing  Neurologic exam :Balance, Rhomberg, finger to nose testing could not be completed due to clinical state Skin: Warm & dry w/o tenting. No significant rash.  See summary under each active problem in the Problem List with associated updated therapeutic plan

## 2021-08-13 NOTE — Assessment & Plan Note (Addendum)
TSH was previously 5.269, mildly elevated.  Current TSH is 0.822.  Close monitor necessary to rule out overcorrection with resultant iatrogenic subclinical hyperthyroidism.

## 2021-08-13 NOTE — Patient Instructions (Signed)
See assessment and plan under each diagnosis in the problem list and acutely for this visit 

## 2021-08-13 NOTE — Assessment & Plan Note (Addendum)
Renal function is monitored at hemodialysis 3 times weekly.  The most current creatinine in Epic was 7.31.  No nephrotoxic drugs identified in the med list.

## 2021-08-13 NOTE — Assessment & Plan Note (Addendum)
Anemia has progressed slightly recently with H/H currently of 11.6/34.  Prior values were 12.9/38.  Macrocytic indices were present with an MCV of 105.6.  The B12 level was previously therapeutic but it is not current as last checked in 2019.  Update clinically indicated.

## 2021-08-14 DIAGNOSIS — N2581 Secondary hyperparathyroidism of renal origin: Secondary | ICD-10-CM | POA: Diagnosis not present

## 2021-08-14 DIAGNOSIS — Z992 Dependence on renal dialysis: Secondary | ICD-10-CM | POA: Diagnosis not present

## 2021-08-14 DIAGNOSIS — D631 Anemia in chronic kidney disease: Secondary | ICD-10-CM | POA: Diagnosis not present

## 2021-08-14 DIAGNOSIS — D509 Iron deficiency anemia, unspecified: Secondary | ICD-10-CM | POA: Diagnosis not present

## 2021-08-14 DIAGNOSIS — N186 End stage renal disease: Secondary | ICD-10-CM | POA: Diagnosis not present

## 2021-08-16 ENCOUNTER — Encounter: Payer: Self-pay | Admitting: Adult Health

## 2021-08-16 ENCOUNTER — Non-Acute Institutional Stay (SKILLED_NURSING_FACILITY): Payer: Medicare Other | Admitting: Adult Health

## 2021-08-16 DIAGNOSIS — I120 Hypertensive chronic kidney disease with stage 5 chronic kidney disease or end stage renal disease: Secondary | ICD-10-CM

## 2021-08-16 DIAGNOSIS — D509 Iron deficiency anemia, unspecified: Secondary | ICD-10-CM | POA: Diagnosis not present

## 2021-08-16 DIAGNOSIS — Z992 Dependence on renal dialysis: Secondary | ICD-10-CM

## 2021-08-16 DIAGNOSIS — N186 End stage renal disease: Secondary | ICD-10-CM

## 2021-08-16 DIAGNOSIS — E1122 Type 2 diabetes mellitus with diabetic chronic kidney disease: Secondary | ICD-10-CM

## 2021-08-16 DIAGNOSIS — N2581 Secondary hyperparathyroidism of renal origin: Secondary | ICD-10-CM | POA: Diagnosis not present

## 2021-08-16 DIAGNOSIS — D631 Anemia in chronic kidney disease: Secondary | ICD-10-CM | POA: Diagnosis not present

## 2021-08-16 NOTE — Progress Notes (Signed)
Location:  [place] Room Number: 112 Place of Service:  SNF (31)   CODE STATUS: full code   No Known Allergies  Chief Complaint  Patient presents with   Acute Visit    Diabetes     HPI:  He is presently taking basaglar 45 units nightly. His novolog and [place] have been stopped due to low cbg readings. He continues to have low AM readings in the 50-60's. He denies any weakness; no excessive hunger or thirst.   Past Medical History:  Diagnosis Date   Abnormal CT scan, kidney 10/06/2011   Acute pyelonephritis 10/07/2011   Anemia    normocytic   Anxiety    mental retardation   Bladder wall thickening 10/06/2011   BPH (benign prostatic hypertrophy)    Diabetes mellitus    Dialysis patient ([place])    Tuesday, Thursday and Saturday,    DVT of leg (deep venous thrombosis) ([place]) 12/25/2016   Edema     history of lower extremity edema   GERD (gastroesophageal reflux disease)    Heme positive stool    Hydronephrosis    Hyperkalemia    Hyperlipidemia    Hypernatremia    Hypertension    Hypothyroidism    Impaired speech    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[place])    MR (mental retardation)    Muscle weakness    Obstructive uropathy    Perinephric abscess 10/07/2011   Poor historian poor historian   Protein calorie malnutrition ([place])    Pyelonephritis    Renal failure (ARF), acute on chronic (HCC)    Renal insufficiency    chronic history   Sepsis ([place])    Smoking    Uremia    Urinary retention    UTI (lower urinary tract infection) 10/06/2011    Past Surgical History:  Procedure Laterality Date   A/V FISTULAGRAM N/A 08/13/2018   Procedure: A/V FISTULAGRAM - Right Upper;  Surgeon: Elam Dutch, MD;  Location: [place] [facility];  Service: Cardiovascular;  Laterality: N/A;   A/V FISTULAGRAM N/A 11/22/2018   Procedure: A/V FISTULAGRAM - Right Upper;  Surgeon: Waynetta Sandy, MD;  Location: [place] [facility];  Service:  Cardiovascular;  Laterality: N/A;   AV FISTULA PLACEMENT Left 07/06/2015   Procedure:  INSERTION LEFT ARM ARTERIOVENOUS GORTEX GRAFT;  Surgeon: Angelia Mould, MD;  Location: [place];  Service: Vascular;  Laterality: Left;   AV FISTULA PLACEMENT Right 02/26/2016   Procedure: ARTERIOVENOUS (AV) FISTULA CREATION ;  Surgeon: Angelia Mould, MD;  Location: [place];  Service: Vascular;  Laterality: Right;   AV FISTULA PLACEMENT Right 11/25/2018   Procedure: INSERTION OF ARTERIOVENOUS (AV) ARTEGRAFT RIGHT UPPER ARM;  Surgeon: Waynetta Sandy, MD;  Location: [place];  Service: Vascular;  Laterality: Right;   AV FISTULA PLACEMENT Left 05/28/2021   Procedure: LEFT ARM ARTERIOVENOUS (AV) FISTULA;  Surgeon: Rosetta Posner, MD;  Location: [facility];  Service: Vascular;  Laterality: Left;   [place] REMOVAL Left 10/09/2015   Procedure: REMOVAL OF ARTERIOVENOUS GORETEX GRAFT ([place]) Evacuation of Lymphocele, Vein Patch angioplasty of brachial artery.;  Surgeon: Angelia Mould, MD;  Location: [place];  Service: Vascular;  Laterality: Left;   [place] Right 02/26/2016   Procedure: Right [place];  Surgeon: Angelia Mould, MD;  Location: [place];  Service: Vascular;  Laterality: Right;   [place] Left 07/30/2021   Procedure: LEFT ARM SECOND STAGE BASILIC VEIN TRANSPOSITION;  Surgeon: Rosetta Posner, MD;  Location: [facility];  Service: Vascular;  Laterality: Left;   CIRCUMCISION N/A 01/04/2014   Procedure: CIRCUMCISION ADULT (procedure #1);  Surgeon: Marissa Nestle, MD;  Location: [facility];  Service: Urology;  Laterality: N/A;   COLECTOMY N/A 05/04/2017   Procedure: TOTAL COLECTOMY;  Surgeon: Aviva Signs, MD;  Location: [facility];  Service: General;  Laterality: N/A;   COLONOSCOPY N/A 04/27/2017   Procedure: COLONOSCOPY;  Surgeon: Daneil Dolin, MD;  [facility];  Service: Endoscopy;  Laterality: N/A;  245   CYSTOSCOPY W/ RETROGRADES Bilateral  06/29/2015   Procedure: CYSTOSCOPY, DILATION OF URETHRAL STRICTURE WITH BILATERAL RETROGRADE PYELOGRAM,SUPRAPUBIC TUBE CHANGE;  Surgeon: Festus Aloe, MD;  Location: [facility];  Service: Urology;  Laterality: Bilateral;   CYSTOSCOPY WITH URETHRAL DILATATION N/A 12/29/2013   Procedure: CYSTOSCOPY WITH URETHRAL DILATATION;  Surgeon: Marissa Nestle, MD;  [facility];  Service: Urology;  Laterality: N/A;   ESOPHAGOGASTRODUODENOSCOPY N/A 04/27/2017   Procedure: ESOPHAGOGASTRODUODENOSCOPY (EGD);  Surgeon: Daneil Dolin, MD;  [facility];  Service: Endoscopy;  Laterality: N/A;   INSERTION OF DIALYSIS CATHETER Right 11/25/2018   Procedure: INSERTION OF DIALYSIS CATHETER RIGHT INTERNAL JUGULAR;  Surgeon: Waynetta Sandy, MD;  Location: [place];  Service: Vascular;  Laterality: Right;   IR AV DIALY SHUNT INTRO NEEDLE/INTRACATH INITIAL W/PTA/IMG RIGHT Right 09/07/2018   IR FLUORO GUIDE CV LINE RIGHT  10/16/2020   IR REMOVAL TUN CV CATH W/O FL  01/12/2019   IR THROMBECTOMY AV FISTULA W/THROMBOLYSIS/PTA INC/SHUNT/IMG RIGHT Right 04/26/2018   IR US GUIDE VASC ACCESS RIGHT  04/26/2018   IR US GUIDE VASC ACCESS RIGHT  09/07/2018   IR US GUIDE VASC ACCESS RIGHT  10/16/2020   IR US GUIDE VASC ACCESS RIGHT  10/16/2020   ORIF FEMUR FRACTURE Right 11/22/2016   Procedure: OPEN REDUCTION INTERNAL FIXATION (ORIF) DISTAL FEMUR FRACTURE;  Surgeon: Rod Can, MD;  Location: [place];  Service: Orthopedics;  Laterality: Right;   PATCH ANGIOPLASTY Right 12/10/2017   Procedure: PATCH ANGIOPLASTY;  Surgeon: Angelia Mould, MD;  Location: [place];  Service: Vascular;  Laterality: Right;   PERIPHERAL VASCULAR BALLOON ANGIOPLASTY  08/13/2018   Procedure: PERIPHERAL VASCULAR BALLOON ANGIOPLASTY;  Surgeon: Elam Dutch, MD;  Location: [place] [facility];  Service: Cardiovascular;;  right AV fistula    PERIPHERAL VASCULAR BALLOON ANGIOPLASTY  11/22/2018   Procedure: PERIPHERAL VASCULAR BALLOON  ANGIOPLASTY;  Surgeon: Waynetta Sandy, MD;  Location: [place] [facility];  Service: Cardiovascular;;  rt AV fistula   PERIPHERAL VASCULAR CATHETERIZATION N/A 10/08/2015   Procedure: A/V Shuntogram;  Surgeon: Angelia Mould, MD;  Location: [place] [facility];  Service: Cardiovascular;  Laterality: N/A;   THROMBECTOMY W/ EMBOLECTOMY Right 12/10/2017   Procedure: THROMBECTOMY REVISION RIGHT ARM  ARTERIOVENOUS FISTULA;  Surgeon: Angelia Mould, MD;  Location[facility];  Service: Vascular;  Laterality: Right;   TRANSURETHRAL RESECTION OF PROSTATE N/A 01/04/2014   Procedure: TRANSURETHRAL RESECTION OF THE PROSTATE (TURP) (procedure #2);  Surgeon: Marissa Nestle, MD;  [facility];  Service: Urology;  Laterality: N/A;    Social History   Socioeconomic History   Marital status: Single    Spouse name: Not on file   Number of children: Not on file   Years of education: Not on file   Highest education level: Not on file  Occupational History   Occupation: retired   Tobacco Use   Smoking status: Never  Smokeless tobacco: Never  Vaping Use   Vaping Use: Never used  Substance and Sexual Activity   Alcohol use: No   Drug use: No   Sexual activity: Not Currently  Other Topics Concern   Not on file  Social History Narrative   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   Long term resident of [facility]    Social Determinants of Health   Financial Resource Strain: Not on file  Food Insecurity: Not on file  Transportation Needs: Not on file  Physical Activity: Not on file  Stress: Not on file  Social Connections: Not on file  Intimate Partner Violence: Not on file   Family History  Problem Relation Age of Onset   Cancer Mother    Colon cancer Neg Hx       VITAL SIGNS BP (!) 108/57   Pulse 78   Temp 97.9 F (36.6 C)   Ht 5\' 8"  (1.727 m)   Wt 212 lb 6.4 oz (96.3 kg)   BMI 32.30 kg/m   Outpatient Encounter Medications as  of 08/16/2021  Medication Sig Note   aspirin EC 81 MG tablet Take 81 mg by mouth daily. 0900 07/25/2021: For elevated D-Dimer   atorvastatin (LIPITOR) 80 MG tablet Take 80 mg by mouth at bedtime. 2100    Insulin Glargine (BASAGLAR KWIKPEN) 100 UNIT/ML SOPN Inject 45 Units into the skin at bedtime. 2100 DO NOT HOLD this insulin without provider notification    latanoprost (XALATAN) 0.005 % ophthalmic solution Place 1 drop into both eyes at bedtime. 2100    levothyroxine (SYNTHROID) 125 MCG tablet Take 125 mcg by mouth daily. 2100    midodrine (PROAMATINE) 10 MG tablet Take 10 mg by mouth See admin instructions. Send med with resident on Mon/Wed/Fri to dialysis. [facility] not [facility] give to resident to take with him. send with 5mg  to equal 15mg . 11:30    midodrine (PROAMATINE) 5 MG tablet Take 5 mg by mouth See admin instructions. Send with resident on dialysis days.  Mon/Wed/Fri.  Take along with 10 mg to equal 15 mg 11:30    NON FORMULARY Diet:NAS, Cons CHO    omega-3 acid ethyl esters (LOVAZA) 1 g capsule Take 2 g by mouth at bedtime. 2100    omeprazole (PRILOSEC) 40 MG capsule Take 40 mg by mouth daily. 0900    polyethylene glycol (MIRALAX / GLYCOLAX) packet Take 17 g by mouth daily as needed for mild constipation.     sevelamer carbonate (RENVELA) 800 MG tablet Take 2,400 mg by mouth 3 (three) times daily with meals. 0900, 1130, 1800    tamsulosin (FLOMAX) 0.4 MG CAPS capsule Take 0.4 mg by mouth daily at 6 PM. Give 30 minutes after a meal. Do not crush or chew    torsemide (DEMADEX) 10 MG tablet Take 50 mg by mouth daily at 6 PM.    No facility-administered encounter medications on file as of 08/16/2021.     SIGNIFICANT DIAGNOSTIC EXAMS  PREVIOUS   05-02-21: Chest x-ray: dialysis cath demonstrated on right. No acute cardiopulmonary process  NO NEW EXAMS.    LABS REVIEWED PREVIOUS;  09-10-20: wbc 11.8; hgb 12.9; hct 40.4; mcv 108.6 plt 163; glucose 225; bun 30;creat 8.92; k+  3.8; na++ 138; ca 9.2 10-12-20: chol 76; ldl 64; trig 319; hdl 24; hgb a1c 7.4  10-17-20: wbc 8.2; hgb 11.0; hct 34.3; mcv 108.2 plt 121; glucose 141; bun 51; creat 13.25; k+ 3.9; na++ 138; ca 8.5  mag 2.1  01-28-21: hgb a1c 6.5  03-07-21: tsh 5.008  05-02-21: wbc 3.8; hgb 10.9; hct 33.7; mcv 105.6 plt 106; glucose 105; bun 17; creat 7.94; k+ 3.8; na++ 138; ca 9.1; GFR 7; d-dimer: 0.95; CRP 0.7 06-12-21: tsh 5.269  06-27-21: hgb a1c 5.6 07-30-21: hgb 11.6; hct 34.0; glucose 104; bun 12; creat 7.30; k+ 5.2; na++ 135; ionized ca++ 0.8   NO NEW LABS.   Review of Systems  Constitutional:  Negative for malaise/fatigue.  Respiratory:  Negative for cough and shortness of breath.   Cardiovascular:  Negative for chest pain, palpitations and leg swelling.  Gastrointestinal:  Negative for abdominal pain, constipation and heartburn.  Musculoskeletal:  Negative for back pain, joint pain and myalgias.  Skin: Negative.   Neurological:  Negative for dizziness.  Psychiatric/Behavioral:  The patient is not nervous/anxious.     Physical Exam Constitutional:      General: He is not in acute distress.    Appearance: He is well-developed. He is obese. He is not diaphoretic.  Neck:     Thyroid: No thyromegaly.  Cardiovascular:     Rate and Rhythm: Normal rate and regular rhythm.     Heart sounds: Normal heart sounds.  Pulmonary:     Effort: Pulmonary effort is normal. No respiratory distress.     Breath sounds: Normal breath sounds.  Abdominal:     General: Bowel sounds are normal. There is no distension.     Palpations: Abdomen is soft.     Tenderness: There is no abdominal tenderness.  Genitourinary:    Comments: Hx turp  Musculoskeletal:        General: Normal range of motion.     Cervical back: Neck supple.     Right lower leg: No edema.     Left lower leg: No edema.     Comments: History of right femur ORIF    Lymphadenopathy:     Cervical: No cervical adenopathy.  Skin:    General: Skin is  warm and dry.     Comments: Chest wall dialysis access Left arm fistula + thrill + bruit     Neurological:     Mental Status: He is alert. Mental status is at baseline.  Psychiatric:        Mood and Affect: Mood normal.     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    Ok Edwards NP [facility]  Contact [phone] Monday through Friday 8am- 5pm  After hours call [phone]

## 2021-08-19 DIAGNOSIS — D631 Anemia in chronic kidney disease: Secondary | ICD-10-CM | POA: Diagnosis not present

## 2021-08-19 DIAGNOSIS — F70 Mild intellectual disabilities: Secondary | ICD-10-CM | POA: Diagnosis not present

## 2021-08-19 DIAGNOSIS — F4322 Adjustment disorder with anxiety: Secondary | ICD-10-CM | POA: Diagnosis not present

## 2021-08-19 DIAGNOSIS — Z992 Dependence on renal dialysis: Secondary | ICD-10-CM | POA: Diagnosis not present

## 2021-08-19 DIAGNOSIS — N186 End stage renal disease: Secondary | ICD-10-CM | POA: Diagnosis not present

## 2021-08-19 DIAGNOSIS — N2581 Secondary hyperparathyroidism of renal origin: Secondary | ICD-10-CM | POA: Diagnosis not present

## 2021-08-19 DIAGNOSIS — D509 Iron deficiency anemia, unspecified: Secondary | ICD-10-CM | POA: Diagnosis not present

## 2021-08-20 ENCOUNTER — Other Ambulatory Visit (HOSPITAL_COMMUNITY)
Admission: RE | Admit: 2021-08-20 | Discharge: 2021-08-20 | Disposition: A | Discharge status: Home / Self Care | Payer: Medicare Other | Source: Skilled Nursing Facility | Attending: Adult Health | Admitting: Adult Health

## 2021-08-20 DIAGNOSIS — Z23 Encounter for immunization: Secondary | ICD-10-CM | POA: Diagnosis not present

## 2021-08-20 DIAGNOSIS — Z20822 Contact with and (suspected) exposure to covid-19: Secondary | ICD-10-CM | POA: Insufficient documentation

## 2021-08-20 DIAGNOSIS — B963 Hemophilus influenzae [H. influenzae] as the cause of diseases classified elsewhere: Secondary | ICD-10-CM | POA: Diagnosis not present

## 2021-08-20 LAB — RESP PANEL BY RT-PCR (FLU A&B, COVID) ARPGX2
Influenza A by PCR: NEGATIVE
Influenza B by PCR: NEGATIVE
SARS Coronavirus 2 by RT PCR: NEGATIVE

## 2021-08-21 DIAGNOSIS — Z992 Dependence on renal dialysis: Secondary | ICD-10-CM | POA: Diagnosis not present

## 2021-08-21 DIAGNOSIS — N2581 Secondary hyperparathyroidism of renal origin: Secondary | ICD-10-CM | POA: Diagnosis not present

## 2021-08-21 DIAGNOSIS — N186 End stage renal disease: Secondary | ICD-10-CM | POA: Diagnosis not present

## 2021-08-21 DIAGNOSIS — D509 Iron deficiency anemia, unspecified: Secondary | ICD-10-CM | POA: Diagnosis not present

## 2021-08-21 DIAGNOSIS — D631 Anemia in chronic kidney disease: Secondary | ICD-10-CM | POA: Diagnosis not present

## 2021-08-23 ENCOUNTER — Non-Acute Institutional Stay (SKILLED_NURSING_FACILITY): Payer: Medicare Other | Admitting: Adult Health

## 2021-08-23 ENCOUNTER — Encounter: Payer: Self-pay | Admitting: Adult Health

## 2021-08-23 DIAGNOSIS — I120 Hypertensive chronic kidney disease with stage 5 chronic kidney disease or end stage renal disease: Secondary | ICD-10-CM

## 2021-08-23 DIAGNOSIS — Z992 Dependence on renal dialysis: Secondary | ICD-10-CM

## 2021-08-23 DIAGNOSIS — N186 End stage renal disease: Secondary | ICD-10-CM | POA: Diagnosis not present

## 2021-08-23 DIAGNOSIS — D631 Anemia in chronic kidney disease: Secondary | ICD-10-CM | POA: Diagnosis not present

## 2021-08-23 DIAGNOSIS — D509 Iron deficiency anemia, unspecified: Secondary | ICD-10-CM | POA: Diagnosis not present

## 2021-08-23 DIAGNOSIS — N2581 Secondary hyperparathyroidism of renal origin: Secondary | ICD-10-CM | POA: Diagnosis not present

## 2021-08-23 DIAGNOSIS — E1122 Type 2 diabetes mellitus with diabetic chronic kidney disease: Secondary | ICD-10-CM

## 2021-08-23 NOTE — Progress Notes (Signed)
Location:  [name] Room Number: 112 Place of Service:      CODE STATUS: full code   No Known Allergies  Chief Complaint  Patient presents with   Acute Visit    diabetes    HPI:  His AM cbg readings remain low in the 40-60's. He has been eating an HS snack. He is asymptomatic with these low cbg's. His hgb a1c is 5.6   Past Medical History:  Diagnosis Date   Abnormal CT scan, kidney 10/06/2011   Acute pyelonephritis 10/07/2011   Anemia    normocytic   Anxiety    mental retardation   Bladder wall thickening 10/06/2011   BPH (benign prostatic hypertrophy)    Diabetes mellitus    Dialysis patient ([place])    Tuesday, Thursday and Saturday,    DVT of leg (deep venous thrombosis) ([place]) 12/25/2016   Edema     history of lower extremity edema   GERD (gastroesophageal reflux disease)    Heme positive stool    Hydronephrosis    Hyperkalemia    Hyperlipidemia    Hypernatremia    Hypertension    Hypothyroidism    Impaired speech    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[place])    MR (mental retardation)    Muscle weakness    Obstructive uropathy    Perinephric abscess 10/07/2011   Poor historian poor historian   Protein calorie malnutrition ([place])    Pyelonephritis    Renal failure (ARF), acute on chronic (HCC)    Renal insufficiency    chronic history   Sepsis ([place])    Smoking    Uremia    Urinary retention    UTI (lower urinary tract infection) 10/06/2011    Past Surgical History:  Procedure Laterality Date   A/V FISTULAGRAM N/A 08/13/2018   Procedure: A/V FISTULAGRAM - Right Upper;  Surgeon: Elam Dutch, MD;  Location: [place] [facility];  Service: Cardiovascular;  Laterality: N/A;   A/V FISTULAGRAM N/A 11/22/2018   Procedure: A/V FISTULAGRAM - Right Upper;  Surgeon: Waynetta Sandy, MD;  Location: [place] [facility];  Service: Cardiovascular;  Laterality: N/A;   AV FISTULA PLACEMENT Left 07/06/2015   Procedure:  INSERTION LEFT  ARM ARTERIOVENOUS GORTEX GRAFT;  Surgeon: Angelia Mould, MD;  Location: [place];  Service: Vascular;  Laterality: Left;   AV FISTULA PLACEMENT Right 02/26/2016   Procedure: ARTERIOVENOUS (AV) FISTULA CREATION ;  Surgeon: Angelia Mould, MD;  Location: [place];  Service: Vascular;  Laterality: Right;   AV FISTULA PLACEMENT Right 11/25/2018   Procedure: INSERTION OF ARTERIOVENOUS (AV) ARTEGRAFT RIGHT UPPER ARM;  Surgeon: Waynetta Sandy, MD;  Location: [place];  Service: Vascular;  Laterality: Right;   AV FISTULA PLACEMENT Left 05/28/2021   Procedure: LEFT ARM ARTERIOVENOUS (AV) FISTULA;  Surgeon: Rosetta Posner, MD;  Location: [facility];  Service: Vascular;  Laterality: Left;   [place] REMOVAL Left 10/09/2015   Procedure: REMOVAL OF ARTERIOVENOUS GORETEX GRAFT ([place]) Evacuation of Lymphocele, Vein Patch angioplasty of brachial artery.;  Surgeon: Angelia Mould, MD;  Location: [place];  Service: Vascular;  Laterality: Left;   [place] Right 02/26/2016   Procedure: Right [place];  Surgeon: Angelia Mould, MD;  Location: [place];  Service: Vascular;  Laterality: Right;   [place] Left 07/30/2021   Procedure: LEFT ARM SECOND STAGE BASILIC VEIN TRANSPOSITION;  Surgeon: Rosetta Posner, MD;  Location: [facility];  Service:  Vascular;  Laterality: Left;   CIRCUMCISION N/A 01/04/2014   Procedure: CIRCUMCISION ADULT (procedure #1);  Surgeon: Marissa Nestle, MD;  Location: [facility];  Service: Urology;  Laterality: N/A;   COLECTOMY N/A 05/04/2017   Procedure: TOTAL COLECTOMY;  Surgeon: Aviva Signs, MD;  Location: [facility];  Service: General;  Laterality: N/A;   COLONOSCOPY N/A 04/27/2017   Procedure: COLONOSCOPY;  Surgeon: Daneil Dolin, MD;  [facility];  Service: Endoscopy;  Laterality: N/A;  245   CYSTOSCOPY W/ RETROGRADES Bilateral 06/29/2015   Procedure: CYSTOSCOPY, DILATION OF URETHRAL STRICTURE WITH BILATERAL RETROGRADE  PYELOGRAM,SUPRAPUBIC TUBE CHANGE;  Surgeon: Festus Aloe, MD;  Location: [facility];  Service: Urology;  Laterality: Bilateral;   CYSTOSCOPY WITH URETHRAL DILATATION N/A 12/29/2013   Procedure: CYSTOSCOPY WITH URETHRAL DILATATION;  Surgeon: Marissa Nestle, MD;  Location[facility];  Service: Urology;  Laterality: N/A;   ESOPHAGOGASTRODUODENOSCOPY N/A 04/27/2017   Procedure: ESOPHAGOGASTRODUODENOSCOPY (EGD);  Surgeon: Daneil Dolin, MD;  [facility];  Service: Endoscopy;  Laterality: N/A;   INSERTION OF DIALYSIS CATHETER Right 11/25/2018   Procedure: INSERTION OF DIALYSIS CATHETER RIGHT INTERNAL JUGULAR;  Surgeon: Waynetta Sandy, MD;  Location: [place];  Service: Vascular;  Laterality: Right;   IR AV DIALY SHUNT INTRO NEEDLE/INTRACATH INITIAL W/PTA/IMG RIGHT Right 09/07/2018   IR FLUORO GUIDE CV LINE RIGHT  10/16/2020   IR REMOVAL TUN CV CATH W/O FL  01/12/2019   IR THROMBECTOMY AV FISTULA W/THROMBOLYSIS/PTA INC/SHUNT/IMG RIGHT Right 04/26/2018   IR US GUIDE VASC ACCESS RIGHT  04/26/2018   IR US GUIDE VASC ACCESS RIGHT  09/07/2018   IR US GUIDE VASC ACCESS RIGHT  10/16/2020   IR US GUIDE VASC ACCESS RIGHT  10/16/2020   ORIF FEMUR FRACTURE Right 11/22/2016   Procedure: OPEN REDUCTION INTERNAL FIXATION (ORIF) DISTAL FEMUR FRACTURE;  Surgeon: Rod Can, MD;  Location: [place];  Service: Orthopedics;  Laterality: Right;   PATCH ANGIOPLASTY Right 12/10/2017   Procedure: PATCH ANGIOPLASTY;  Surgeon: Angelia Mould, MD;  Location: [place];  Service: Vascular;  Laterality: Right;   PERIPHERAL VASCULAR BALLOON ANGIOPLASTY  08/13/2018   Procedure: PERIPHERAL VASCULAR BALLOON ANGIOPLASTY;  Surgeon: Elam Dutch, MD;  Location: [place] [facility];  Service: Cardiovascular;;  right AV fistula    PERIPHERAL VASCULAR BALLOON ANGIOPLASTY  11/22/2018   Procedure: PERIPHERAL VASCULAR BALLOON ANGIOPLASTY;  Surgeon: Waynetta Sandy, MD;  Location: [place] [facility];  Service:  Cardiovascular;;  rt AV fistula   PERIPHERAL VASCULAR CATHETERIZATION N/A 10/08/2015   Procedure: A/V Shuntogram;  Surgeon: Angelia Mould, MD;  Location: [place] [facility];  Service: Cardiovascular;  Laterality: N/A;   THROMBECTOMY W/ EMBOLECTOMY Right 12/10/2017   Procedure: THROMBECTOMY REVISION RIGHT ARM  ARTERIOVENOUS FISTULA;  Surgeon: Angelia Mould, MD;  Location: [facility];  Service: Vascular;  Laterality: Right;   TRANSURETHRAL RESECTION OF PROSTATE N/A 01/04/2014   Procedure: TRANSURETHRAL RESECTION OF THE PROSTATE (TURP) (procedure #2);  Surgeon: Marissa Nestle, MD;  [facility];  Service: Urology;  Laterality: N/A;    Social History   Socioeconomic History   Marital status: Single    Spouse name: Not on file   Number of children: Not on file   Years of education: Not on file   Highest education level: Not on file  Occupational History   Occupation: retired   Tobacco Use   Smoking status: Never   Smokeless tobacco: Never  Vaping Use   Vaping  Use: Never used  Substance and Sexual Activity   Alcohol use: No   Drug use: No   Sexual activity: Not Currently  Other Topics Concern   Not on file  Social History Narrative   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   [facility]    Social Determinants of Health   Financial Resource Strain: Not on file  Food Insecurity: Not on file  Transportation Needs: Not on file  Physical Activity: Not on file  Stress: Not on file  Social Connections: Not on file  Intimate Partner Violence: Not on file   Family History  Problem Relation Age of Onset   Cancer Mother    Colon cancer Neg Hx       VITAL SIGNS BP 110/68   Pulse 70   Temp 97.8 F (36.6 C)   Ht 5\' 8"  (1.727 m)   Wt 212 lb (96.2 kg)   BMI 32.23 kg/m   Outpatient Encounter Medications as of 08/23/2021  Medication Sig Note   aspirin EC 81 MG tablet Take 81 mg by mouth daily. 0900 07/25/2021:  For elevated D-Dimer   atorvastatin (LIPITOR) 80 MG tablet Take 80 mg by mouth at bedtime. 2100    Insulin Glargine (BASAGLAR KWIKPEN) 100 UNIT/ML SOPN Inject 40 Units into the skin at bedtime. 2100 DO NOT HOLD this insulin without provider notification    latanoprost (XALATAN) 0.005 % ophthalmic solution Place 1 drop into both eyes at bedtime. 2100    levothyroxine (SYNTHROID) 125 MCG tablet Take 125 mcg by mouth daily. 2100    midodrine (PROAMATINE) 10 MG tablet Take 10 mg by mouth See admin instructions. Send med with resident on Mon/Wed/Fri to dialysis. do not give [facility] give to resident to take with him. send with 5mg  to equal 15mg . 11:30    midodrine (PROAMATINE) 5 MG tablet Take 5 mg by mouth See admin instructions. Send with resident on dialysis days.  Mon/Wed/Fri.  Take along with 10 mg to equal 15 mg 11:30    NON FORMULARY Diet:NAS, Cons CHO    omega-3 acid ethyl esters (LOVAZA) 1 g capsule Take 2 g by mouth at bedtime. 2100    omeprazole (PRILOSEC) 40 MG capsule Take 40 mg by mouth daily. 0900    polyethylene glycol (MIRALAX / GLYCOLAX) packet Take 17 g by mouth daily as needed for mild constipation.     sevelamer carbonate (RENVELA) 800 MG tablet Take 2,400 mg by mouth 3 (three) times daily with meals. 0900, 1130, 1800    tamsulosin (FLOMAX) 0.4 MG CAPS capsule Take 0.4 mg by mouth daily at 6 PM. Give 30 minutes after a meal. Do not crush or chew    torsemide (DEMADEX) 10 MG tablet Take 50 mg by mouth daily at 6 PM.    No facility-administered encounter medications on file as of 08/23/2021.     SIGNIFICANT DIAGNOSTIC EXAMS   PREVIOUS   05-02-21: Chest x-ray: dialysis cath demonstrated on right. No acute cardiopulmonary process  NO NEW EXAMS.    LABS REVIEWED PREVIOUS;  09-10-20: wbc 11.8; hgb 12.9; hct 40.4; mcv 108.6 plt 163; glucose 225; bun 30;creat 8.92; k+ 3.8; na++ 138; ca 9.2 10-12-20: chol 76; ldl 64; trig 319; hdl 24; hgb a1c 7.4  10-17-20: wbc 8.2; hgb  11.0; hct 34.3; mcv 108.2 plt 121; glucose 141; bun 51; creat 13.25; k+ 3.9; na++ 138; ca 8.5 mag 2.1  01-28-21: hgb a1c 6.5  03-07-21: tsh 5.008  05-02-21: wbc 3.8; hgb 10.9; hct 33.7; mcv 105.6 plt 106; glucose 105; bun 17; creat 7.94; k+ 3.8; na++ 138; ca 9.1; GFR 7; d-dimer: 0.95; CRP 0.7 06-12-21: tsh 5.269  06-27-21: hgb a1c 5.6 07-30-21: hgb 11.6; hct 34.0; glucose 104; bun 12; creat 7.30; k+ 5.2; na++ 135; ionized ca++ 0.8   NO NEW LABS.   Review of Systems  Constitutional:  Negative for malaise/fatigue.  Respiratory:  Negative for cough and shortness of breath.   Cardiovascular:  Negative for chest pain, palpitations and leg swelling.  Gastrointestinal:  Negative for abdominal pain, constipation and heartburn.  Musculoskeletal:  Negative for back pain, joint pain and myalgias.  Skin: Negative.   Neurological:  Negative for dizziness.  Psychiatric/Behavioral:  The patient is not nervous/anxious.    Physical Exam Constitutional:      General: He is not in acute distress.    Appearance: He is well-developed. He is obese. He is not diaphoretic.  Neck:     Thyroid: No thyromegaly.  Cardiovascular:     Rate and Rhythm: Normal rate and regular rhythm.     Pulses: Normal pulses.     Heart sounds: Normal heart sounds.  Pulmonary:     Effort: Pulmonary effort is normal. No respiratory distress.     Breath sounds: Normal breath sounds.  Abdominal:     General: Bowel sounds are normal. There is no distension.     Palpations: Abdomen is soft.     Tenderness: There is no abdominal tenderness.  Genitourinary:    Comments: Hx turp Musculoskeletal:        General: Normal range of motion.     Cervical back: Neck supple.     Right lower leg: No edema.     Left lower leg: No edema.     Comments:  History of right femur ORIF     Lymphadenopathy:     Cervical: No cervical adenopathy.  Skin:    General: Skin is warm and dry.     Comments:  Chest wall dialysis access Left arm fistula +  thrill + bruit     Neurological:     Mental Status: He is alert. Mental status is at baseline.  Psychiatric:        Mood and Affect: Mood normal.     ASSESSMENT/ PLAN:  TODAY  Type 2 diabetes mellitus with hypertension and end stage renal disease on dialysis  Will lower her basaglar to 30 units nightly and will monitor his status.    Zachary Edwards NP [facility]  Contact [phone] Monday through Friday 8am- 5pm  After hours call [phone]

## 2021-08-26 DIAGNOSIS — Z992 Dependence on renal dialysis: Secondary | ICD-10-CM | POA: Diagnosis not present

## 2021-08-26 DIAGNOSIS — N2581 Secondary hyperparathyroidism of renal origin: Secondary | ICD-10-CM | POA: Diagnosis not present

## 2021-08-26 DIAGNOSIS — D509 Iron deficiency anemia, unspecified: Secondary | ICD-10-CM | POA: Diagnosis not present

## 2021-08-26 DIAGNOSIS — D631 Anemia in chronic kidney disease: Secondary | ICD-10-CM | POA: Diagnosis not present

## 2021-08-26 DIAGNOSIS — N186 End stage renal disease: Secondary | ICD-10-CM | POA: Diagnosis not present

## 2021-08-28 DIAGNOSIS — Z992 Dependence on renal dialysis: Secondary | ICD-10-CM | POA: Diagnosis not present

## 2021-08-28 DIAGNOSIS — D631 Anemia in chronic kidney disease: Secondary | ICD-10-CM | POA: Diagnosis not present

## 2021-08-28 DIAGNOSIS — N186 End stage renal disease: Secondary | ICD-10-CM | POA: Diagnosis not present

## 2021-08-28 DIAGNOSIS — D509 Iron deficiency anemia, unspecified: Secondary | ICD-10-CM | POA: Diagnosis not present

## 2021-08-30 DIAGNOSIS — D509 Iron deficiency anemia, unspecified: Secondary | ICD-10-CM | POA: Diagnosis not present

## 2021-08-30 DIAGNOSIS — N186 End stage renal disease: Secondary | ICD-10-CM | POA: Diagnosis not present

## 2021-08-30 DIAGNOSIS — Z992 Dependence on renal dialysis: Secondary | ICD-10-CM | POA: Diagnosis not present

## 2021-08-30 DIAGNOSIS — D631 Anemia in chronic kidney disease: Secondary | ICD-10-CM | POA: Diagnosis not present

## 2021-09-02 DIAGNOSIS — N186 End stage renal disease: Secondary | ICD-10-CM | POA: Diagnosis not present

## 2021-09-02 DIAGNOSIS — D631 Anemia in chronic kidney disease: Secondary | ICD-10-CM | POA: Diagnosis not present

## 2021-09-02 DIAGNOSIS — D509 Iron deficiency anemia, unspecified: Secondary | ICD-10-CM | POA: Diagnosis not present

## 2021-09-02 DIAGNOSIS — Z992 Dependence on renal dialysis: Secondary | ICD-10-CM | POA: Diagnosis not present

## 2021-09-04 ENCOUNTER — Encounter: Payer: Medicare Other | Admitting: Vascular Surgery

## 2021-09-04 DIAGNOSIS — N186 End stage renal disease: Secondary | ICD-10-CM | POA: Diagnosis not present

## 2021-09-04 DIAGNOSIS — D509 Iron deficiency anemia, unspecified: Secondary | ICD-10-CM | POA: Diagnosis not present

## 2021-09-04 DIAGNOSIS — D631 Anemia in chronic kidney disease: Secondary | ICD-10-CM | POA: Diagnosis not present

## 2021-09-04 DIAGNOSIS — Z992 Dependence on renal dialysis: Secondary | ICD-10-CM | POA: Diagnosis not present

## 2021-09-06 DIAGNOSIS — Z992 Dependence on renal dialysis: Secondary | ICD-10-CM | POA: Diagnosis not present

## 2021-09-06 DIAGNOSIS — D631 Anemia in chronic kidney disease: Secondary | ICD-10-CM | POA: Diagnosis not present

## 2021-09-06 DIAGNOSIS — N186 End stage renal disease: Secondary | ICD-10-CM | POA: Diagnosis not present

## 2021-09-06 DIAGNOSIS — D509 Iron deficiency anemia, unspecified: Secondary | ICD-10-CM | POA: Diagnosis not present

## 2021-09-10 DIAGNOSIS — D631 Anemia in chronic kidney disease: Secondary | ICD-10-CM | POA: Diagnosis not present

## 2021-09-10 DIAGNOSIS — N186 End stage renal disease: Secondary | ICD-10-CM | POA: Diagnosis not present

## 2021-09-10 DIAGNOSIS — D509 Iron deficiency anemia, unspecified: Secondary | ICD-10-CM | POA: Diagnosis not present

## 2021-09-10 DIAGNOSIS — Z992 Dependence on renal dialysis: Secondary | ICD-10-CM | POA: Diagnosis not present

## 2021-09-11 ENCOUNTER — Encounter: Payer: Medicare Other | Admitting: Vascular Surgery

## 2021-09-12 ENCOUNTER — Non-Acute Institutional Stay (SKILLED_NURSING_FACILITY): Payer: Medicare Other | Admitting: Adult Health

## 2021-09-12 ENCOUNTER — Encounter: Payer: Self-pay | Admitting: Adult Health

## 2021-09-12 DIAGNOSIS — E1122 Type 2 diabetes mellitus with diabetic chronic kidney disease: Secondary | ICD-10-CM

## 2021-09-12 DIAGNOSIS — D631 Anemia in chronic kidney disease: Secondary | ICD-10-CM | POA: Diagnosis not present

## 2021-09-12 DIAGNOSIS — Z992 Dependence on renal dialysis: Secondary | ICD-10-CM | POA: Diagnosis not present

## 2021-09-12 DIAGNOSIS — E034 Atrophy of thyroid (acquired): Secondary | ICD-10-CM | POA: Diagnosis not present

## 2021-09-12 DIAGNOSIS — N186 End stage renal disease: Secondary | ICD-10-CM

## 2021-09-12 DIAGNOSIS — D509 Iron deficiency anemia, unspecified: Secondary | ICD-10-CM | POA: Diagnosis not present

## 2021-09-12 DIAGNOSIS — F321 Major depressive disorder, single episode, moderate: Secondary | ICD-10-CM

## 2021-09-12 NOTE — Progress Notes (Signed)
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 There are no reports of uncontrolled pain. There are no reports of anxiety or agitation. He does get out of bed daily.   Past Medical History:  Diagnosis Date   Abnormal CT scan, kidney 10/06/2011   Acute pyelonephritis 10/07/2011   Anemia    normocytic   Anxiety    mental retardation   Bladder wall thickening 10/06/2011   BPH (benign prostatic hypertrophy)    Diabetes mellitus    Dialysis patient ([place])    Tuesday, Thursday and Saturday,    DVT of leg (deep venous thrombosis) ([place]) 12/25/2016   Edema     history of lower extremity edema   GERD (gastroesophageal reflux disease)    Heme positive stool    Hydronephrosis    Hyperkalemia    Hyperlipidemia    Hypernatremia    Hypertension    Hypothyroidism    Impaired speech    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[place])    MR (mental retardation)    Muscle weakness    Obstructive uropathy    Perinephric abscess 10/07/2011   Poor historian poor historian   Protein calorie malnutrition ([place])    Pyelonephritis    Renal failure (ARF), acute on chronic (HCC)    Renal insufficiency    chronic history   Sepsis ([place])    Smoking     Uremia    Urinary retention    UTI (lower urinary tract infection) 10/06/2011    Past Surgical History:  Procedure Laterality Date   A/V FISTULAGRAM N/A 08/13/2018   Procedure: A/V FISTULAGRAM - Right Upper;  Surgeon: Elam Dutch, MD;  Location: [place];  Service: Cardiovascular;  Laterality: N/A;   A/V FISTULAGRAM N/A 11/22/2018   Procedure: A/V FISTULAGRAM - Right Upper;  Surgeon: Waynetta Sandy, MD;  Location: [place];  Service: Cardiovascular;  Laterality: N/A;   AV FISTULA PLACEMENT Left 07/06/2015   Procedure:  INSERTION LEFT ARM ARTERIOVENOUS GORTEX GRAFT;  Surgeon: Angelia Mould, MD;  Location: [place];  Service: Vascular;  Laterality: Left;   AV FISTULA PLACEMENT Right 02/26/2016   Procedure: ARTERIOVENOUS (AV) FISTULA CREATION ;  Surgeon: Angelia Mould, MD;  Location: [place];  Service: Vascular;  Laterality: Right;   AV FISTULA PLACEMENT Right 11/25/2018   Procedure: INSERTION OF ARTERIOVENOUS (AV) ARTEGRAFT RIGHT UPPER ARM;  Surgeon: Waynetta Sandy, MD;  Location: [place];  Service: Vascular;  Laterality: Right;   AV FISTULA PLACEMENT Left 05/28/2021   Procedure: LEFT ARM ARTERIOVENOUS (AV) FISTULA;  Surgeon: Rosetta Posner, MD;  Location: [facility];  Service: Vascular;  Laterality: Left;  [place] REMOVAL Left 10/09/2015   Procedure: REMOVAL OF ARTERIOVENOUS GORETEX GRAFT ([place]) Evacuation of Lymphocele, Vein Patch angioplasty of brachial artery.;  Surgeon: Angelia Mould, MD;  Location: [place];  Service: Vascular;  Laterality: Left;   [place] Right 02/26/2016   Procedure: Right [place];  Surgeon: Angelia Mould, MD;  Location: [place];  Service: Vascular;  Laterality: Right;   [place] Left 07/30/2021   Procedure: LEFT ARM SECOND STAGE BASILIC VEIN TRANSPOSITION;  Surgeon: Rosetta Posner, MD;  Location[facility];  Service: Vascular;  Laterality: Left;   CIRCUMCISION N/A  01/04/2014   Procedure: CIRCUMCISION ADULT (procedure #1);  Surgeon: Marissa Nestle, MD;  Location[facility];  Service: Urology;  Laterality: N/A;   COLECTOMY N/A 05/04/2017   Procedure: TOTAL COLECTOMY;  Surgeon: Aviva Signs, MD;  Location[facility];  Service: General;  Laterality: N/A;   COLONOSCOPY N/A 04/27/2017   Procedure: COLONOSCOPY;  Surgeon: Daneil Dolin, MD;  [facility];  Service: Endoscopy;  Laterality: N/A;  245   CYSTOSCOPY W/ RETROGRADES Bilateral 06/29/2015   Procedure: CYSTOSCOPY, DILATION OF URETHRAL STRICTURE WITH BILATERAL RETROGRADE PYELOGRAM,SUPRAPUBIC TUBE CHANGE;  Surgeon: Festus Aloe, MD;  Location: [facility];  Service: Urology;  Laterality: Bilateral;   CYSTOSCOPY WITH URETHRAL DILATATION N/A 12/29/2013   Procedure: CYSTOSCOPY WITH URETHRAL DILATATION;  Surgeon: Marissa Nestle, MD;  [facility];  Service: Urology;  Laterality: N/A;   ESOPHAGOGASTRODUODENOSCOPY N/A 04/27/2017   Procedure: ESOPHAGOGASTRODUODENOSCOPY (EGD);  Surgeon: Daneil Dolin, MD;  [facility];  Service: Endoscopy;  Laterality: N/A;   INSERTION OF DIALYSIS CATHETER Right 11/25/2018   Procedure: INSERTION OF DIALYSIS CATHETER RIGHT INTERNAL JUGULAR;  Surgeon: Waynetta Sandy, MD;  Location: [place];  Service: Vascular;  Laterality: Right;   IR AV DIALY SHUNT INTRO NEEDLE/INTRACATH INITIAL W/PTA/IMG RIGHT Right 09/07/2018   IR FLUORO GUIDE CV LINE RIGHT  10/16/2020   IR REMOVAL TUN CV CATH W/O FL  01/12/2019   IR THROMBECTOMY AV FISTULA W/THROMBOLYSIS/PTA INC/SHUNT/IMG RIGHT Right 04/26/2018   IR US GUIDE VASC ACCESS RIGHT  04/26/2018   IR US GUIDE VASC ACCESS RIGHT  09/07/2018   IR US GUIDE VASC ACCESS RIGHT  10/16/2020   IR US GUIDE VASC ACCESS RIGHT  10/16/2020   ORIF FEMUR FRACTURE Right 11/22/2016   Procedure: OPEN REDUCTION INTERNAL FIXATION (ORIF) DISTAL FEMUR FRACTURE;  Surgeon: Rod Can, MD;  Location: [place];  Service: Orthopedics;  Laterality: Right;    PATCH ANGIOPLASTY Right 12/10/2017   Procedure: PATCH ANGIOPLASTY;  Surgeon: Angelia Mould, MD;  Location: [place];  Service: Vascular;  Laterality: Right;   PERIPHERAL VASCULAR BALLOON ANGIOPLASTY  08/13/2018   Procedure: PERIPHERAL VASCULAR BALLOON ANGIOPLASTY;  Surgeon: Elam Dutch, MD;  Location: [place] [facility];  Service: Cardiovascular;;  right AV fistula    PERIPHERAL VASCULAR BALLOON ANGIOPLASTY  11/22/2018   Procedure: PERIPHERAL VASCULAR BALLOON ANGIOPLASTY;  Surgeon: Waynetta Sandy, MD;  Location: [place] [facility];  Service: Cardiovascular;;  rt AV fistula   PERIPHERAL VASCULAR CATHETERIZATION N/A 10/08/2015   Procedure: A/V Shuntogram;  Surgeon: Angelia Mould, MD;  Location: [place] [facility];  Service: Cardiovascular;  Laterality: N/A;   THROMBECTOMY W/ EMBOLECTOMY Right 12/10/2017   Procedure: THROMBECTOMY REVISION RIGHT ARM  ARTERIOVENOUS FISTULA;  Surgeon: Angelia Mould, MD;  Location: [place];  Service: Vascular;  Laterality: Right;   TRANSURETHRAL RESECTION OF PROSTATE N/A 01/04/2014   Procedure:  TRANSURETHRAL RESECTION OF THE PROSTATE (TURP) (procedure #2);  Surgeon: Marissa Nestle, MD;  [facility];  Service: Urology;  Laterality: N/A;    Social History   Socioeconomic History   Marital status: Single    Spouse name: Not on file   Number of children: Not on file   Years of education: Not on file   Highest education level: Not on file  Occupational History   Occupation: retired   Tobacco Use   Smoking status: Never   Smokeless tobacco: Never  Vaping Use   Vaping Use: Never used  Substance and Sexual Activity   Alcohol use: No   Drug use: No   Sexual activity: Not Currently  Other Topics Concern   Not on file  Social History Narrative   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   [facility] resident of [facility]    Social Determinants of Health   Financial Resource Strain: Not on  file  Food Insecurity: Not on file  Transportation Needs: Not on file  Physical Activity: Not on file  Stress: Not on file  Social Connections: Not on file  Intimate Partner Violence: Not on file   Family History  Problem Relation Age of Onset   Cancer Mother    Colon cancer Neg Hx       VITAL SIGNS BP 137/72   Pulse 78   Temp (!) 97.5 F (36.4 C)   Resp 18   Ht 5\' 8"  (1.727 m)   Wt 213 lb 11.2 oz (96.9 kg)   BMI 32.49 kg/m   Outpatient Encounter Medications as of 09/12/2021  Medication Sig Note   aspirin EC 81 MG tablet Take 81 mg by mouth daily. 0900 07/25/2021: For elevated D-Dimer   atorvastatin (LIPITOR) 80 MG tablet Take 80 mg by mouth at bedtime. 2100    Insulin Glargine (BASAGLAR KWIKPEN) 100 UNIT/ML SOPN Inject 30 Units into the skin at bedtime. 2100 DO NOT HOLD this insulin without provider notification    latanoprost (XALATAN) 0.005 % ophthalmic solution Place 1 drop into both eyes at bedtime. 2100    levothyroxine (SYNTHROID) 125 MCG tablet Take 125 mcg by mouth daily. 2100    midodrine (PROAMATINE) 10 MG tablet Take 10 mg by mouth See admin instructions. Send med with resident on Mon/Wed/Fri to dialysis. do not give [facility] give to resident to take with him. send with 5mg  to equal 15mg . 11:30    midodrine (PROAMATINE) 5 MG tablet Take 5 mg by mouth See admin instructions. Send with resident on dialysis days.  Mon/Wed/Fri.  Take along with 10 mg to equal 15 mg 11:30    NON FORMULARY Diet:NAS, Cons CHO    omega-3 acid ethyl esters (LOVAZA) 1 g capsule Take 2 g by mouth at bedtime. 2100    omeprazole (PRILOSEC) 40 MG capsule Take 40 mg by mouth daily. 0900    polyethylene glycol (MIRALAX / GLYCOLAX) packet Take 17 g by mouth daily as needed for mild constipation.     sevelamer carbonate (RENVELA) 800 MG tablet Take 2,400 mg by mouth 3 (three) times daily with meals. 0900, 1130, 1800    tamsulosin (FLOMAX) 0.4 MG CAPS capsule Take 0.4 mg by mouth daily at 6 PM.  Give 30 minutes after a meal. Do not crush or chew    torsemide (DEMADEX) 10 MG tablet Take 50 mg by mouth daily at 6 PM.    No facility-administered encounter medications on file as of 09/12/2021.  SIGNIFICANT DIAGNOSTIC EXAMS  PREVIOUS   05-02-21: Chest x-ray: dialysis cath demonstrated on right. No acute cardiopulmonary process  NO NEW EXAMS.    LABS REVIEWED PREVIOUS;  09-10-20: wbc 11.8; hgb 12.9; hct 40.4; mcv 108.6 plt 163; glucose 225; bun 30;creat 8.92; k+ 3.8; na++ 138; ca 9.2 10-12-20: chol 76; ldl 64; trig 319; hdl 24; hgb a1c 7.4  10-17-20: wbc 8.2; hgb 11.0; hct 34.3; mcv 108.2 plt 121; glucose 141; bun 51; creat 13.25; k+ 3.9; na++ 138; ca 8.5 mag 2.1  01-28-21: hgb a1c 6.5  03-07-21: tsh 5.008  05-02-21: wbc 3.8; hgb 10.9; hct 33.7; mcv 105.6 plt 106; glucose 105; bun 17; creat 7.94; k+ 3.8; na++ 138; ca 9.1; GFR 7; d-dimer: 0.95; CRP 0.7 06-12-21: tsh 5.269  06-27-21: hgb a1c 5.6 07-30-21: hgb 11.6; hct 34.0; glucose 104; bun 12; creat 7.30; k+ 5.2; na++ 135; ionized ca++ 0.8   TODAY  08-12-21: tsh 0.822   Review of Systems  Constitutional:  Negative for malaise/fatigue.  Respiratory:  Negative for cough and shortness of breath.   Cardiovascular:  Negative for chest pain, palpitations and leg swelling.  Gastrointestinal:  Negative for abdominal pain, constipation and heartburn.  Musculoskeletal:  Negative for back pain, joint pain and myalgias.  Skin: Negative.   Neurological:  Negative for dizziness.  Psychiatric/Behavioral:  The patient is not nervous/anxious.     Physical Exam Constitutional:      General: He is not in acute distress.    Appearance: He is well-developed. He is not diaphoretic.  Neck:     Thyroid: No thyromegaly.  Cardiovascular:     Rate and Rhythm: Normal rate and regular rhythm.     Heart sounds: Normal heart sounds.  Pulmonary:     Effort: Pulmonary effort is normal. No respiratory distress.     Breath sounds: Normal breath sounds.   Abdominal:     General: Bowel sounds are normal. There is no distension.     Palpations: Abdomen is soft.     Tenderness: There is no abdominal tenderness.  Genitourinary:    Comments: Hx turp  Musculoskeletal:        General: Normal range of motion.     Cervical back: Neck supple.     Right lower leg: No edema.     Left lower leg: No edema.     Comments: History of right femur ORIF   Lymphadenopathy:     Cervical: No cervical adenopathy.  Skin:    General: Skin is warm and dry.  Neurological:     Mental Status: He is alert and oriented to person, place, and time.  Psychiatric:        Mood and Affect: Mood normal.     ASSESSMENT/ PLAN:  TODAY  End stage renal disease on hemodialysis due to type 2 diabetes mellitus/ hemodialysis dependent: is stable will continue dialysis three days per week; is followed nephrology. Will continue midodrine 15 mg with dialysis 1200 cc fluid restriction; renvela 1600 mg with meals 800 mg with snacks  2. Other specified hypothyroidism: is stable tsh 0.822 will continue synthroid 125 mcg daily   3. Anemia of chronic renal disease on chronic dialysis: is stable hgb 11.0 will monitor   4. Major depression single episode moderate: is stable is off medications will monitor   PREVIOUS   5. Type 2 diabetes mellitus with hypertension and end stage renal disease is stable hgb a1c 7.2; will continue januvia 25 mg daily basaglar 30 units nightly novolog  10 units with breakfast and lunch; 20 units with lunch  6. Bilateral lower extremity edema: is stable will continue demadex 50 mg daily   7. orthostatic hypotension: is stable b/p 117/74 will continue midodrine 15 mg with dialysis   8. Aortic atherosclerosis (ct 2019) will monitor   9. GERD without esophagitis: is stable will continue prilosec 40 mg daily  10. History of colon cancer: is stable is status post colectomy 05-04-17  11. Dyslipidemia associated with type 2 diabetes mellitus: LDL 64; trig  319; will continue lipitor 80 mg daily and lovaza 2 gm daily    Ok Edwards NP [facility]  Contact [phone] Monday through Friday 8am- 5pm  After hours call [phone]

## 2021-09-17 DIAGNOSIS — D631 Anemia in chronic kidney disease: Secondary | ICD-10-CM | POA: Diagnosis not present

## 2021-09-17 DIAGNOSIS — Z992 Dependence on renal dialysis: Secondary | ICD-10-CM | POA: Diagnosis not present

## 2021-09-17 DIAGNOSIS — N186 End stage renal disease: Secondary | ICD-10-CM | POA: Diagnosis not present

## 2021-09-17 DIAGNOSIS — D509 Iron deficiency anemia, unspecified: Secondary | ICD-10-CM | POA: Diagnosis not present

## 2021-09-19 DIAGNOSIS — D631 Anemia in chronic kidney disease: Secondary | ICD-10-CM | POA: Diagnosis not present

## 2021-09-19 DIAGNOSIS — Z992 Dependence on renal dialysis: Secondary | ICD-10-CM | POA: Diagnosis not present

## 2021-09-19 DIAGNOSIS — N186 End stage renal disease: Secondary | ICD-10-CM | POA: Diagnosis not present

## 2021-09-19 DIAGNOSIS — D509 Iron deficiency anemia, unspecified: Secondary | ICD-10-CM | POA: Diagnosis not present

## 2021-09-21 DIAGNOSIS — N186 End stage renal disease: Secondary | ICD-10-CM | POA: Diagnosis not present

## 2021-09-21 DIAGNOSIS — D631 Anemia in chronic kidney disease: Secondary | ICD-10-CM | POA: Diagnosis not present

## 2021-09-21 DIAGNOSIS — Z992 Dependence on renal dialysis: Secondary | ICD-10-CM | POA: Diagnosis not present

## 2021-09-21 DIAGNOSIS — D509 Iron deficiency anemia, unspecified: Secondary | ICD-10-CM | POA: Diagnosis not present

## 2021-09-24 DIAGNOSIS — Z992 Dependence on renal dialysis: Secondary | ICD-10-CM | POA: Diagnosis not present

## 2021-09-24 DIAGNOSIS — D509 Iron deficiency anemia, unspecified: Secondary | ICD-10-CM | POA: Diagnosis not present

## 2021-09-24 DIAGNOSIS — N186 End stage renal disease: Secondary | ICD-10-CM | POA: Diagnosis not present

## 2021-09-24 DIAGNOSIS — D631 Anemia in chronic kidney disease: Secondary | ICD-10-CM | POA: Diagnosis not present

## 2021-09-25 DIAGNOSIS — Z992 Dependence on renal dialysis: Secondary | ICD-10-CM | POA: Diagnosis not present

## 2021-09-25 DIAGNOSIS — N186 End stage renal disease: Secondary | ICD-10-CM | POA: Diagnosis not present

## 2021-09-26 ENCOUNTER — Non-Acute Institutional Stay (SKILLED_NURSING_FACILITY): Payer: Medicare Other | Admitting: Adult Health

## 2021-09-26 ENCOUNTER — Encounter: Payer: Self-pay | Admitting: Adult Health

## 2021-09-26 DIAGNOSIS — I120 Hypertensive chronic kidney disease with stage 5 chronic kidney disease or end stage renal disease: Secondary | ICD-10-CM

## 2021-09-26 DIAGNOSIS — N186 End stage renal disease: Secondary | ICD-10-CM

## 2021-09-26 DIAGNOSIS — D509 Iron deficiency anemia, unspecified: Secondary | ICD-10-CM | POA: Diagnosis not present

## 2021-09-26 DIAGNOSIS — Z992 Dependence on renal dialysis: Secondary | ICD-10-CM | POA: Diagnosis not present

## 2021-09-26 DIAGNOSIS — E1122 Type 2 diabetes mellitus with diabetic chronic kidney disease: Secondary | ICD-10-CM | POA: Diagnosis not present

## 2021-09-26 DIAGNOSIS — N2581 Secondary hyperparathyroidism of renal origin: Secondary | ICD-10-CM | POA: Diagnosis not present

## 2021-09-26 DIAGNOSIS — D631 Anemia in chronic kidney disease: Secondary | ICD-10-CM | POA: Diagnosis not present

## 2021-09-26 NOTE — Progress Notes (Signed)
Location:  [facility] Room Number: 112-D Place of Service:  SNF (31)   CODE STATUS: Full Code  No Known Allergies  Chief Complaint  Patient presents with   Acute Visit    Diabetes     HPI:  He is presently taking basaglar 30 units nightly he has has some low cbg readings. There are no reports of missed doses. He is nonadherent to his dietary restrictions.   Past Medical History:  Diagnosis Date   Abnormal CT scan, kidney 10/06/2011   Acute pyelonephritis 10/07/2011   Anemia    normocytic   Anxiety    mental retardation   Bladder wall thickening 10/06/2011   BPH (benign prostatic hypertrophy)    Diabetes mellitus    Dialysis patient ([place])    Tuesday, Thursday and Saturday,    DVT of leg (deep venous thrombosis) ([place]) 12/25/2016   Edema     history of lower extremity edema   GERD (gastroesophageal reflux disease)    Heme positive stool    Hydronephrosis    Hyperkalemia    Hyperlipidemia    Hypernatremia    Hypertension    Hypothyroidism    Impaired speech    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[place])    MR (mental retardation)    Muscle weakness    Obstructive uropathy    Perinephric abscess 10/07/2011   Poor historian poor historian   Protein calorie malnutrition ([place])    Pyelonephritis    Renal failure (ARF), acute on chronic (HCC)    Renal insufficiency    chronic history   Sepsis ([place])    Smoking    Uremia    Urinary retention    UTI (lower urinary tract infection) 10/06/2011    Past Surgical History:  Procedure Laterality Date   A/V FISTULAGRAM N/A 08/13/2018   Procedure: A/V FISTULAGRAM - Right Upper;  Surgeon: Elam Dutch, MD;  Location: [facility];  Service: Cardiovascular;  Laterality: N/A;   A/V FISTULAGRAM N/A 11/22/2018   Procedure: A/V FISTULAGRAM - Right Upper;  Surgeon: Waynetta Sandy, MD;  Location: [facility];  Service: Cardiovascular;  Laterality: N/A;   AV FISTULA PLACEMENT Left  07/06/2015   Procedure:  INSERTION LEFT ARM ARTERIOVENOUS GORTEX GRAFT;  Surgeon: Angelia Mould, MD;  Location: [facility];  Service: Vascular;  Laterality: Left;   AV FISTULA PLACEMENT Right 02/26/2016   Procedure: ARTERIOVENOUS (AV) FISTULA CREATION ;  Surgeon: Angelia Mould, MD;  Location: [facility];  Service: Vascular;  Laterality: Right;   AV FISTULA PLACEMENT Right 11/25/2018   Procedure: INSERTION OF ARTERIOVENOUS (AV) ARTEGRAFT RIGHT UPPER ARM;  Surgeon: Waynetta Sandy, MD;  Location: [facility];  Service: Vascular;  Laterality: Right;   AV FISTULA PLACEMENT Left 05/28/2021   Procedure: LEFT ARM ARTERIOVENOUS (AV) FISTULA;  Surgeon: Rosetta Posner, MD;  Location: [facility];  Service: Vascular;  Laterality: Left;   [place] REMOVAL Left 10/09/2015   Procedure: REMOVAL OF ARTERIOVENOUS GORETEX GRAFT ([place]) Evacuation of Lymphocele, Vein Patch angioplasty of brachial artery.;  Surgeon: Angelia Mould, MD;  Location: [facility];  Service: Vascular;  Laterality: Left;   [place] Right 02/26/2016   Procedure: Right [place];  Surgeon: Angelia Mould, MD;  Location: [facility];  Service: Vascular;  Laterality: Right;   [place] Left 07/30/2021   Procedure: LEFT ARM SECOND STAGE BASILIC VEIN TRANSPOSITION;  Surgeon: Rosetta Posner, MD;  Location: [facility];  Service:  Vascular;  Laterality: Left;   CIRCUMCISION N/A 01/04/2014   Procedure: CIRCUMCISION ADULT (procedure #1);  Surgeon: Marissa Nestle, MD;  Location: [facility];  Service: Urology;  Laterality: N/A;   COLECTOMY N/A 05/04/2017   Procedure: TOTAL COLECTOMY;  Surgeon: Aviva Signs, MD;  Location: [facility];  Service: General;  Laterality: N/A;   COLONOSCOPY N/A 04/27/2017   Procedure: COLONOSCOPY;  Surgeon: Daneil Dolin, MD;  [facility];  Service: Endoscopy;  Laterality: N/A;  245   CYSTOSCOPY W/ RETROGRADES Bilateral 06/29/2015   Procedure: CYSTOSCOPY, DILATION OF URETHRAL  STRICTURE WITH BILATERAL RETROGRADE PYELOGRAM,SUPRAPUBIC TUBE CHANGE;  Surgeon: Festus Aloe, MD;  Location: [facility];  Service: Urology;  Laterality: Bilateral;   CYSTOSCOPY WITH URETHRAL DILATATION N/A 12/29/2013   Procedure: CYSTOSCOPY WITH URETHRAL DILATATION;  Surgeon: Marissa Nestle, MD;  [facility];  Service: Urology;  Laterality: N/A;   ESOPHAGOGASTRODUODENOSCOPY N/A 04/27/2017   Procedure: ESOPHAGOGASTRODUODENOSCOPY (EGD);  Surgeon: Daneil Dolin, MD;  [facility];  Service: Endoscopy;  Laterality: N/A;   INSERTION OF DIALYSIS CATHETER Right 11/25/2018   Procedure: INSERTION OF DIALYSIS CATHETER RIGHT INTERNAL JUGULAR;  Surgeon: Waynetta Sandy, MD;  Location: [facility];  Service: Vascular;  Laterality: Right;   IR AV DIALY SHUNT INTRO NEEDLE/INTRACATH INITIAL W/PTA/IMG RIGHT Right 09/07/2018   IR FLUORO GUIDE CV LINE RIGHT  10/16/2020   IR REMOVAL TUN CV CATH W/O FL  01/12/2019   IR THROMBECTOMY AV FISTULA W/THROMBOLYSIS/PTA INC/SHUNT/IMG RIGHT Right 04/26/2018   IR US GUIDE VASC ACCESS RIGHT  04/26/2018   IR US GUIDE VASC ACCESS RIGHT  09/07/2018   IR US GUIDE VASC ACCESS RIGHT  10/16/2020   IR US GUIDE VASC ACCESS RIGHT  10/16/2020   ORIF FEMUR FRACTURE Right 11/22/2016   Procedure: OPEN REDUCTION INTERNAL FIXATION (ORIF) DISTAL FEMUR FRACTURE;  Surgeon: Rod Can, MD;  Location: [facility];  Service: Orthopedics;  Laterality: Right;   PATCH ANGIOPLASTY Right 12/10/2017   Procedure: PATCH ANGIOPLASTY;  Surgeon: Angelia Mould, MD;  Location: [facility];  Service: Vascular;  Laterality: Right;   PERIPHERAL VASCULAR BALLOON ANGIOPLASTY  08/13/2018   Procedure: PERIPHERAL VASCULAR BALLOON ANGIOPLASTY;  Surgeon: Elam Dutch, MD;  Location: [facility];  Service: Cardiovascular;;  right AV fistula    PERIPHERAL VASCULAR BALLOON ANGIOPLASTY  11/22/2018   Procedure: PERIPHERAL VASCULAR BALLOON ANGIOPLASTY;  Surgeon: Waynetta Sandy, MD;   Location: [facility];  Service: Cardiovascular;;  rt AV fistula   PERIPHERAL VASCULAR CATHETERIZATION N/A 10/08/2015   Procedure: A/V Shuntogram;  Surgeon: Angelia Mould, MD;  Location: [facility];  Service: Cardiovascular;  Laterality: N/A;   THROMBECTOMY W/ EMBOLECTOMY Right 12/10/2017   Procedure: THROMBECTOMY REVISION RIGHT ARM  ARTERIOVENOUS FISTULA;  Surgeon: Angelia Mould, MD;  Location: [facility];  Service: Vascular;  Laterality: Right;   TRANSURETHRAL RESECTION OF PROSTATE N/A 01/04/2014   Procedure: TRANSURETHRAL RESECTION OF THE PROSTATE (TURP) (procedure #2);  Surgeon: Marissa Nestle, MD;  [facility];  Service: Urology;  Laterality: N/A;    Social History   Socioeconomic History   Marital status: Single    Spouse name: Not on file   Number of children: Not on file   Years of education: Not on file   Highest education level: Not on file  Occupational History   Occupation: retired   Tobacco Use   Smoking status: Never   Smokeless tobacco: Never  Vaping Use   Vaping  Use: Never used  Substance and Sexual Activity   Alcohol use: No   Drug use: No   Sexual activity: Not Currently  Other Topics Concern   Not on file  Social History Narrative   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   Long term resident of [facility]    Social Determinants of Health   Financial Resource Strain: Not on file  Food Insecurity: Not on file  Transportation Needs: Not on file  Physical Activity: Not on file  Stress: Not on file  Social Connections: Not on file  Intimate Partner Violence: Not on file   Family History  Problem Relation Age of Onset   Cancer Mother    Colon cancer Neg Hx       VITAL SIGNS BP 135/81   Pulse 80   Temp 97.6 F (36.4 C)   Resp 18   Ht 5\' 8"  (1.727 m)   Wt 213 lb 11.2 oz (96.9 kg)   SpO2 99%   BMI 32.49 kg/m   Outpatient Encounter Medications as of 09/26/2021  Medication Sig Note    aspirin EC 81 MG tablet Take 81 mg by mouth daily. 0900 07/25/2021: For elevated D-Dimer   atorvastatin (LIPITOR) 80 MG tablet Take 80 mg by mouth at bedtime. 2100    Insulin Glargine (BASAGLAR KWIKPEN) 100 UNIT/ML SOPN Inject 30 Units into the skin at bedtime. 2100 DO NOT HOLD this insulin without provider notification    latanoprost (XALATAN) 0.005 % ophthalmic solution Place 1 drop into both eyes at bedtime. 2100    levothyroxine (SYNTHROID) 125 MCG tablet Take 125 mcg by mouth daily. 2100    midodrine (PROAMATINE) 10 MG tablet Take 10 mg by mouth See admin instructions. Send med with resident on Mon/Wed/Fri to dialysis. do not give [facility] give to resident to take with him. send with 5mg  to equal 15mg . 11:30    midodrine (PROAMATINE) 5 MG tablet Take 5 mg by mouth See admin instructions. Send with resident on dialysis days.  Mon/Wed/Fri.  Take along with 10 mg to equal 15 mg 11:30    NON FORMULARY Diet:NAS, Cons CHO    omega-3 acid ethyl esters (LOVAZA) 1 g capsule Take 2 g by mouth at bedtime. 2100    omeprazole (PRILOSEC) 40 MG capsule Take 40 mg by mouth daily. 0900    polyethylene glycol (MIRALAX / GLYCOLAX) packet Take 17 g by mouth daily as needed for mild constipation.     sevelamer carbonate (RENVELA) 800 MG tablet Take 2,400 mg by mouth 3 (three) times daily with meals. 0900, 1130, 1800    tamsulosin (FLOMAX) 0.4 MG CAPS capsule Take 0.4 mg by mouth daily at 6 PM. Give 30 minutes after a meal. Do not crush or chew    torsemide (DEMADEX) 10 MG tablet Take 50 mg by mouth daily at 6 PM.    No facility-administered encounter medications on file as of 09/26/2021.     SIGNIFICANT DIAGNOSTIC EXAMS   PREVIOUS   05-02-21: Chest x-ray: dialysis cath demonstrated on right. No acute cardiopulmonary process  NO NEW EXAMS.    LABS REVIEWED PREVIOUS;  10-12-20: chol 76; ldl 64; trig 319; hdl 24; hgb a1c 7.4  10-17-20: wbc 8.2; hgb 11.0; hct 34.3; mcv 108.2 plt 121; glucose 141;  bun 51; creat 13.25; k+ 3.9; na++ 138; ca 8.5 mag 2.1  01-28-21: hgb a1c 6.5  03-07-21: tsh 5.008  05-02-21: wbc 3.8; hgb 10.9; hct 33.7; mcv 105.6 plt 106;  glucose 105; bun 17; creat 7.94; k+ 3.8; na++ 138; ca 9.1; GFR 7; d-dimer: 0.95; CRP 0.7 06-12-21: tsh 5.269  06-27-21: hgb a1c 5.6 07-30-21: hgb 11.6; hct 34.0; glucose 104; bun 12; creat 7.30; k+ 5.2; na++ 135; ionized ca++ 0.8  08-12-21: tsh 0.822   NO NEW LABS.   Review of Systems  Constitutional:  Negative for malaise/fatigue.  Respiratory:  Negative for cough and shortness of breath.   Cardiovascular:  Negative for chest pain, palpitations and leg swelling.  Gastrointestinal:  Negative for abdominal pain, constipation and heartburn.  Musculoskeletal:  Negative for back pain, joint pain and myalgias.  Skin: Negative.   Neurological:  Negative for dizziness.  Psychiatric/Behavioral:  The patient is not nervous/anxious.     Physical Exam Constitutional:      General: He is not in acute distress.    Appearance: He is well-developed. He is not diaphoretic.  Neck:     Thyroid: No thyromegaly.  Cardiovascular:     Rate and Rhythm: Normal rate and regular rhythm.     Pulses: Normal pulses.     Heart sounds: Normal heart sounds.  Pulmonary:     Effort: Pulmonary effort is normal. No respiratory distress.     Breath sounds: Normal breath sounds.  Abdominal:     General: Bowel sounds are normal. There is no distension.     Palpations: Abdomen is soft.     Tenderness: There is no abdominal tenderness.  Genitourinary:    Comments: Hx turp  Musculoskeletal:        General: Normal range of motion.     Cervical back: Neck supple.     Right lower leg: No edema.     Left lower leg: No edema.     Comments: History of right femur ORIF    Lymphadenopathy:     Cervical: No cervical adenopathy.  Skin:    General: Skin is warm and dry.  Neurological:     Mental Status: He is alert. Mental status is at baseline.  Psychiatric:         Mood and Affect: Mood normal.     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    Ok Edwards NP [facility]  Contact [phone] Monday through Friday 8am- 5pm  After hours call [phone]

## 2021-09-27 ENCOUNTER — Encounter: Payer: Self-pay | Admitting: Adult Health

## 2021-09-27 ENCOUNTER — Non-Acute Institutional Stay (SKILLED_NURSING_FACILITY): Payer: Medicare Other | Admitting: Adult Health

## 2021-09-27 DIAGNOSIS — I120 Hypertensive chronic kidney disease with stage 5 chronic kidney disease or end stage renal disease: Secondary | ICD-10-CM | POA: Diagnosis not present

## 2021-09-27 DIAGNOSIS — I700 Atherosclerosis of aorta: Secondary | ICD-10-CM | POA: Diagnosis not present

## 2021-09-27 DIAGNOSIS — E1122 Type 2 diabetes mellitus with diabetic chronic kidney disease: Secondary | ICD-10-CM

## 2021-09-27 DIAGNOSIS — Z992 Dependence on renal dialysis: Secondary | ICD-10-CM | POA: Diagnosis not present

## 2021-09-27 DIAGNOSIS — N186 End stage renal disease: Secondary | ICD-10-CM | POA: Diagnosis not present

## 2021-09-27 NOTE — Progress Notes (Signed)
Location:  [facility] Room Number: 112-D Place of Service:  SNF (31) Provider: Ok Edwards, NP  CODE STATUS: FULL CODE  No Known Allergies  Chief Complaint  Patient presents with   Acute Visit    Care plan meeting.     HPI:  We have come together for his care plan meeting. BIMS 3/15 mod 2/30: nervous at times. He is supervision to limited assist with adls. Occasionally incontinent of bladder and frequently incontinent of bowel. He is nonambulatory no falls. Dietary: NAS CON CHO diet 1200 cc fluid restriction; good appetite weight stable at 213.7 pounds. Therapy none at this time. 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  Past Medical History:  Diagnosis Date   Abnormal CT scan, kidney 10/06/2011   Acute pyelonephritis 10/07/2011   Anemia    normocytic   Anxiety    mental retardation   Bladder wall thickening 10/06/2011   BPH (benign prostatic hypertrophy)    Diabetes mellitus    Dialysis patient ([place])    Tuesday, Thursday and Saturday,    DVT of leg (deep venous thrombosis) ([place]) 12/25/2016   Edema     history of lower extremity edema   GERD (gastroesophageal reflux disease)    Heme positive stool    Hydronephrosis    Hyperkalemia    Hyperlipidemia    Hypernatremia    Hypertension    Hypothyroidism    Impaired speech    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HCC)    MR (mental retardation)    Muscle weakness    Obstructive uropathy    Perinephric abscess 10/07/2011   Poor historian poor historian   Protein calorie malnutrition (HCC)    Pyelonephritis    Renal failure (ARF), acute on chronic (HCC)    Renal insufficiency    chronic history   Sepsis ([place])    Smoking    Uremia    Urinary retention    UTI (lower urinary tract infection) 10/06/2011    Past Surgical History:   Procedure Laterality Date   A/V FISTULAGRAM N/A 08/13/2018   Procedure: A/V FISTULAGRAM - Right Upper;  Surgeon: Elam Dutch, MD;  Location: [facility];  Service: Cardiovascular;  Laterality: N/A;   A/V FISTULAGRAM N/A 11/22/2018   Procedure: A/V FISTULAGRAM - Right Upper;  Surgeon: Waynetta Sandy, MD;  Location: [facility];  Service: Cardiovascular;  Laterality: N/A;   AV FISTULA PLACEMENT Left 07/06/2015   Procedure:  INSERTION LEFT ARM ARTERIOVENOUS GORTEX GRAFT;  Surgeon: Angelia Mould, MD;  Location: [facility];  Service: Vascular;  Laterality: Left;   AV FISTULA PLACEMENT Right 02/26/2016   Procedure: ARTERIOVENOUS (AV) FISTULA CREATION ;  Surgeon: Angelia Mould, MD;  Location: [place];  Service: Vascular;  Laterality: Right;   AV FISTULA PLACEMENT Right 11/25/2018   Procedure: INSERTION OF ARTERIOVENOUS (AV) ARTEGRAFT RIGHT UPPER ARM;  Surgeon: Waynetta Sandy, MD;  Location: [facility];  Service: Vascular;  Laterality: Right;   AV FISTULA PLACEMENT Left 05/28/2021   Procedure: LEFT ARM ARTERIOVENOUS (AV) FISTULA;  Surgeon: Rosetta Posner, MD;  [facility];  Service: Vascular;  Laterality: Left;   [place] REMOVAL Left 10/09/2015   Procedure: REMOVAL OF ARTERIOVENOUS GORETEX GRAFT ([place]) Evacuation of Lymphocele, Vein Patch angioplasty of brachial artery.;  Surgeon: Angelia Mould, MD;  Location: [place];  Service: Vascular;  Laterality: Left;   BASCILIC VEIN TRANSPOSITION Right 02/26/2016   Procedure: Right BASCILIC VEIN TRANSPOSITION;  Surgeon: Angelia Mould, MD;  Location: [place];  Service: Vascular;  Laterality: Right;   [place] Left 07/30/2021   Procedure: LEFT ARM SECOND STAGE BASILIC VEIN TRANSPOSITION;  Surgeon: Rosetta Posner, MD;  Location[facility];  Service: Vascular;  Laterality: Left;   CIRCUMCISION N/A 01/04/2014   Procedure: CIRCUMCISION ADULT (procedure #1);  Surgeon: Marissa Nestle, MD;  [facility];  Service: Urology;  Laterality: N/A;   COLECTOMY N/A 05/04/2017   Procedure: TOTAL COLECTOMY;  Surgeon: Aviva Signs, MD;  Location[facility];  Service: General;  Laterality: N/A;   COLONOSCOPY N/A 04/27/2017   Procedure: COLONOSCOPY;  Surgeon: Daneil Dolin, MD;  [facility];  Service: Endoscopy;  Laterality: N/A;  245   CYSTOSCOPY W/ RETROGRADES Bilateral 06/29/2015   Procedure: CYSTOSCOPY, DILATION OF URETHRAL STRICTURE WITH BILATERAL RETROGRADE PYELOGRAM,SUPRAPUBIC TUBE CHANGE;  Surgeon: Festus Aloe, MD;  Location: [facility];  Service: Urology;  Laterality: Bilateral;   CYSTOSCOPY WITH URETHRAL DILATATION N/A 12/29/2013   Procedure: CYSTOSCOPY WITH URETHRAL DILATATION;  Surgeon: Marissa Nestle, MD;  [facility];  Service: Urology;  Laterality: N/A;   ESOPHAGOGASTRODUODENOSCOPY N/A 04/27/2017   Procedure: ESOPHAGOGASTRODUODENOSCOPY (EGD);  Surgeon: Daneil Dolin, MD;  [facility];  Service: Endoscopy;  Laterality: N/A;   INSERTION OF DIALYSIS CATHETER Right 11/25/2018   Procedure: INSERTION OF DIALYSIS CATHETER RIGHT INTERNAL JUGULAR;  Surgeon: Waynetta Sandy, MD;  Location: [facility];  Service: Vascular;  Laterality: Right;   IR AV DIALY SHUNT INTRO NEEDLE/INTRACATH INITIAL W/PTA/IMG RIGHT Right 09/07/2018   IR FLUORO GUIDE CV LINE RIGHT  10/16/2020   IR REMOVAL TUN CV CATH W/O FL  01/12/2019   IR THROMBECTOMY AV FISTULA W/THROMBOLYSIS/PTA INC/SHUNT/IMG RIGHT Right 04/26/2018   IR US GUIDE VASC ACCESS RIGHT  04/26/2018   IR US GUIDE VASC ACCESS RIGHT  09/07/2018   IR US GUIDE VASC ACCESS RIGHT  10/16/2020   IR US GUIDE VASC ACCESS RIGHT  10/16/2020   ORIF FEMUR FRACTURE Right 11/22/2016   Procedure: OPEN REDUCTION INTERNAL FIXATION (ORIF) DISTAL FEMUR FRACTURE;  Surgeon: Rod Can, MD;  Location: [facility];  Service: Orthopedics;  Laterality: Right;   PATCH ANGIOPLASTY Right 12/10/2017   Procedure: PATCH ANGIOPLASTY;  Surgeon: Angelia Mould, MD;   Location: [place];  Service: Vascular;  Laterality: Right;   PERIPHERAL VASCULAR BALLOON ANGIOPLASTY  08/13/2018   Procedure: PERIPHERAL VASCULAR BALLOON ANGIOPLASTY;  Surgeon: Elam Dutch, MD;  Location: [facility];  Service: Cardiovascular;;  right AV fistula    PERIPHERAL VASCULAR BALLOON ANGIOPLASTY  11/22/2018   Procedure: PERIPHERAL VASCULAR BALLOON ANGIOPLASTY;  Surgeon: Waynetta Sandy, MD;  Location: [facility];  Service: Cardiovascular;;  rt AV fistula   PERIPHERAL VASCULAR CATHETERIZATION N/A 10/08/2015   Procedure: A/V Shuntogram;  Surgeon: Angelia Mould, MD;  Location: [facility];  Service: Cardiovascular;  Laterality: N/A;   THROMBECTOMY W/ EMBOLECTOMY Right 12/10/2017   Procedure: THROMBECTOMY REVISION RIGHT ARM  ARTERIOVENOUS FISTULA;  Surgeon: Angelia Mould, MD;  [facility];  Service: Vascular;  Laterality: Right;   TRANSURETHRAL RESECTION OF PROSTATE N/A 01/04/2014   Procedure: TRANSURETHRAL RESECTION OF THE PROSTATE (TURP) (procedure #2);  Surgeon: Marissa Nestle, MD;  [facility];  Service: Urology;  Laterality: N/A;    Social History   Socioeconomic History  Marital status: Single    Spouse name: Not on file   Number of children: Not on file   Years of education: Not on file   Highest education level: Not on file  Occupational History   Occupation: retired   Tobacco Use   Smoking status: Never   Smokeless tobacco: Never  Vaping Use   Vaping Use: Never used  Substance and Sexual Activity   Alcohol use: No   Drug use: No   Sexual activity: Not Currently  Other Topics Concern   Not on file  Social History Narrative   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   [facility] resident of [facility]    Social Determinants of Health   Financial Resource Strain: Not on file  Food Insecurity: Not on file  Transportation Needs: Not on file  Physical Activity: Not on file   Stress: Not on file  Social Connections: Not on file  Intimate Partner Violence: Not on file   Family History  Problem Relation Age of Onset   Cancer Mother    Colon cancer Neg Hx       VITAL SIGNS BP 133/79   Pulse 65   Temp 97.6 F (36.4 C)   Resp 18   Ht 5\' 8"  (1.727 m)   Wt 214 lb 6.4 oz (97.3 kg)   SpO2 99%   BMI 32.60 kg/m   Outpatient Encounter Medications as of 09/27/2021  Medication Sig   aspirin EC 81 MG tablet Take 81 mg by mouth daily. 0900   atorvastatin (LIPITOR) 80 MG tablet Take 80 mg by mouth at bedtime. 2100   Insulin Glargine (BASAGLAR KWIKPEN) 100 UNIT/ML SOPN Inject 25 Units into the skin at bedtime. 2100 DO NOT HOLD this insulin without provider notification   latanoprost (XALATAN) 0.005 % ophthalmic solution Place 1 drop into both eyes at bedtime. 2100   levothyroxine (SYNTHROID) 125 MCG tablet Take 125 mcg by mouth daily. 2100   midodrine (PROAMATINE) 10 MG tablet Take 10 mg by mouth See admin instructions. Send med with resident on Mon/Wed/Fri to dialysis. do not give [facility] give to resident to take with him. send with 5mg  to equal 15mg . 11:30   midodrine (PROAMATINE) 5 MG tablet Take 5 mg by mouth See admin instructions. Send with resident on dialysis days.  Mon/Wed/Fri.  Take along with 10 mg to equal 15 mg 11:30   NON FORMULARY Diet:NAS, Cons CHO   omega-3 acid ethyl esters (LOVAZA) 1 g capsule Take 2 g by mouth at bedtime. 2100   omeprazole (PRILOSEC) 40 MG capsule Take 40 mg by mouth daily. 0900   polyethylene glycol (MIRALAX / GLYCOLAX) packet Take 17 g by mouth daily as needed for mild constipation.    sevelamer carbonate (RENVELA) 800 MG tablet Take 2,400 mg by mouth 3 (three) times daily with meals. 0900, 1130, 1800   tamsulosin (FLOMAX) 0.4 MG CAPS capsule Take 0.4 mg by mouth daily at 6 PM. Give 30 minutes after a meal. Do not crush or chew   torsemide (DEMADEX) 10 MG tablet Take 50 mg by mouth daily at 6 PM.   No  facility-administered encounter medications on file as of 09/27/2021.     SIGNIFICANT DIAGNOSTIC EXAMS  PREVIOUS   05-02-21: Chest x-ray: dialysis cath demonstrated on right. No acute cardiopulmonary process  NO NEW EXAMS.    LABS REVIEWED PREVIOUS;  10-12-20: chol 76; ldl 64; trig 319; hdl 24; hgb a1c 7.4  10-17-20: wbc 8.2;  hgb 11.0; hct 34.3; mcv 108.2 plt 121; glucose 141; bun 51; creat 13.25; k+ 3.9; na++ 138; ca 8.5 mag 2.1  01-28-21: hgb a1c 6.5  03-07-21: tsh 5.008  05-02-21: wbc 3.8; hgb 10.9; hct 33.7; mcv 105.6 plt 106; glucose 105; bun 17; creat 7.94; k+ 3.8; na++ 138; ca 9.1; GFR 7; d-dimer: 0.95; CRP 0.7 06-12-21: tsh 5.269  06-27-21: hgb a1c 5.6 07-30-21: hgb 11.6; hct 34.0; glucose 104; bun 12; creat 7.30; k+ 5.2; na++ 135; ionized ca++ 0.8  08-12-21: tsh 0.822   NO NEW LABS.   Review of Systems  Constitutional:  Negative for malaise/fatigue.  Respiratory:  Negative for cough and shortness of breath.   Cardiovascular:  Negative for chest pain, palpitations and leg swelling.  Gastrointestinal:  Negative for abdominal pain, constipation and heartburn.  Musculoskeletal:  Negative for back pain, joint pain and myalgias.  Skin: Negative.   Neurological:  Negative for dizziness.  Psychiatric/Behavioral:  The patient is not nervous/anxious.    Physical Exam Constitutional:      General: He is not in acute distress.    Appearance: He is well-developed. He is not diaphoretic.  Neck:     Thyroid: No thyromegaly.  Cardiovascular:     Rate and Rhythm: Normal rate and regular rhythm.     Pulses: Normal pulses.     Heart sounds: Normal heart sounds.  Pulmonary:     Effort: Pulmonary effort is normal. No respiratory distress.     Breath sounds: Normal breath sounds.  Abdominal:     General: Bowel sounds are normal. There is no distension.     Palpations: Abdomen is soft.     Tenderness: There is no abdominal tenderness.  Genitourinary:    Comments: Hx  turp Musculoskeletal:        General: Normal range of motion.     Cervical back: Neck supple.     Comments: History of right femur ORIF     Lymphadenopathy:     Cervical: No cervical adenopathy.  Skin:    General: Skin is warm and dry.  Neurological:     Mental Status: He is alert. Mental status is at baseline.  Psychiatric:        Mood and Affect: Mood normal.      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   Time spent with patient: 40 minutes: medications; plan of care.    Ok Edwards NP [facility]  Contact [phone] Monday through Friday 8am- 5pm  After hours call [phone]

## 2021-09-28 DIAGNOSIS — D631 Anemia in chronic kidney disease: Secondary | ICD-10-CM | POA: Diagnosis not present

## 2021-09-28 DIAGNOSIS — D509 Iron deficiency anemia, unspecified: Secondary | ICD-10-CM | POA: Diagnosis not present

## 2021-09-28 DIAGNOSIS — N2581 Secondary hyperparathyroidism of renal origin: Secondary | ICD-10-CM | POA: Diagnosis not present

## 2021-09-28 DIAGNOSIS — Z992 Dependence on renal dialysis: Secondary | ICD-10-CM | POA: Diagnosis not present

## 2021-09-28 DIAGNOSIS — N186 End stage renal disease: Secondary | ICD-10-CM | POA: Diagnosis not present

## 2021-09-30 DIAGNOSIS — L602 Onychogryphosis: Secondary | ICD-10-CM | POA: Diagnosis not present

## 2021-09-30 DIAGNOSIS — L603 Nail dystrophy: Secondary | ICD-10-CM | POA: Diagnosis not present

## 2021-09-30 DIAGNOSIS — E1151 Type 2 diabetes mellitus with diabetic peripheral angiopathy without gangrene: Secondary | ICD-10-CM | POA: Diagnosis not present

## 2021-10-01 DIAGNOSIS — Z992 Dependence on renal dialysis: Secondary | ICD-10-CM | POA: Diagnosis not present

## 2021-10-01 DIAGNOSIS — N186 End stage renal disease: Secondary | ICD-10-CM | POA: Diagnosis not present

## 2021-10-01 DIAGNOSIS — D509 Iron deficiency anemia, unspecified: Secondary | ICD-10-CM | POA: Diagnosis not present

## 2021-10-01 DIAGNOSIS — N2581 Secondary hyperparathyroidism of renal origin: Secondary | ICD-10-CM | POA: Diagnosis not present

## 2021-10-01 DIAGNOSIS — D631 Anemia in chronic kidney disease: Secondary | ICD-10-CM | POA: Diagnosis not present

## 2021-10-02 DIAGNOSIS — H2513 Age-related nuclear cataract, bilateral: Secondary | ICD-10-CM | POA: Diagnosis not present

## 2021-10-02 DIAGNOSIS — H401134 Primary open-angle glaucoma, bilateral, indeterminate stage: Secondary | ICD-10-CM | POA: Diagnosis not present

## 2021-10-02 DIAGNOSIS — E113293 Type 2 diabetes mellitus with mild nonproliferative diabetic retinopathy without macular edema, bilateral: Secondary | ICD-10-CM | POA: Diagnosis not present

## 2021-10-03 DIAGNOSIS — D509 Iron deficiency anemia, unspecified: Secondary | ICD-10-CM | POA: Diagnosis not present

## 2021-10-03 DIAGNOSIS — N2581 Secondary hyperparathyroidism of renal origin: Secondary | ICD-10-CM | POA: Diagnosis not present

## 2021-10-03 DIAGNOSIS — Z992 Dependence on renal dialysis: Secondary | ICD-10-CM | POA: Diagnosis not present

## 2021-10-03 DIAGNOSIS — D631 Anemia in chronic kidney disease: Secondary | ICD-10-CM | POA: Diagnosis not present

## 2021-10-03 DIAGNOSIS — N186 End stage renal disease: Secondary | ICD-10-CM | POA: Diagnosis not present

## 2021-10-05 DIAGNOSIS — N186 End stage renal disease: Secondary | ICD-10-CM | POA: Diagnosis not present

## 2021-10-05 DIAGNOSIS — D509 Iron deficiency anemia, unspecified: Secondary | ICD-10-CM | POA: Diagnosis not present

## 2021-10-05 DIAGNOSIS — Z992 Dependence on renal dialysis: Secondary | ICD-10-CM | POA: Diagnosis not present

## 2021-10-05 DIAGNOSIS — N2581 Secondary hyperparathyroidism of renal origin: Secondary | ICD-10-CM | POA: Diagnosis not present

## 2021-10-05 DIAGNOSIS — D631 Anemia in chronic kidney disease: Secondary | ICD-10-CM | POA: Diagnosis not present

## 2021-10-07 ENCOUNTER — Non-Acute Institutional Stay (SKILLED_NURSING_FACILITY): Payer: Medicare Other | Admitting: Adult Health

## 2021-10-07 ENCOUNTER — Encounter: Payer: Self-pay | Admitting: Adult Health

## 2021-10-07 DIAGNOSIS — E1169 Type 2 diabetes mellitus with other specified complication: Secondary | ICD-10-CM

## 2021-10-07 DIAGNOSIS — E785 Hyperlipidemia, unspecified: Secondary | ICD-10-CM | POA: Diagnosis not present

## 2021-10-07 DIAGNOSIS — C189 Malignant neoplasm of colon, unspecified: Secondary | ICD-10-CM | POA: Diagnosis not present

## 2021-10-07 DIAGNOSIS — K219 Gastro-esophageal reflux disease without esophagitis: Secondary | ICD-10-CM | POA: Diagnosis not present

## 2021-10-07 NOTE — Progress Notes (Signed)
Location:  [facility] Room Number: 112-D Place of Service:  SNF (31)   CODE STATUS: Full Code  No Known Allergies  Chief Complaint  Patient presents with   Medical Management of Chronic Issues                GERD without esophagitis:   History of colon cancer:  Dyslipidemia associated with type 2 diabetes mellitus:    HPI:  Zachary [name] is a 75 year old long term resident of this facility being seen for the management of his chronic illnesses: GERD without esophagitis:   History of colon cancer:  Dyslipidemia associated with type 2 diabetes mellitus. There are no reports of uncontrolled pain; no reports of changes in appetite; weight is without significant change. Zachary [name] continues on hemodialysis three days weekly.   Past Medical History:  Diagnosis Date   Abnormal CT scan, kidney 10/06/2011   Acute pyelonephritis 10/07/2011   Anemia    normocytic   Anxiety    mental retardation   Bladder wall thickening 10/06/2011   BPH (benign prostatic hypertrophy)    Diabetes mellitus    Dialysis patient ([place])    Tuesday, Thursday and Saturday,    DVT of leg (deep venous thrombosis) ([place]) 12/25/2016   Edema     history of lower extremity edema   GERD (gastroesophageal reflux disease)    Heme positive stool    Hydronephrosis    Hyperkalemia    Hyperlipidemia    Hypernatremia    Hypertension    Hypothyroidism    Impaired speech    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[place])    MR (mental retardation)    Muscle weakness    Obstructive uropathy    Perinephric abscess 10/07/2011   Poor historian poor historian   Protein calorie malnutrition ([place])    Pyelonephritis    Renal failure (ARF), acute on chronic (HCC)    Renal insufficiency    chronic history   Sepsis ([place])    Smoking    Uremia    Urinary retention    UTI (lower urinary tract infection) 10/06/2011    Past Surgical History:  Procedure Laterality Date   A/V FISTULAGRAM N/A 08/13/2018   Procedure: A/V  FISTULAGRAM - Right Upper;  Surgeon: Elam Dutch, MD;  Location: [facility];  Service: Cardiovascular;  Laterality: N/A;   A/V FISTULAGRAM N/A 11/22/2018   Procedure: A/V FISTULAGRAM - Right Upper;  Surgeon: Waynetta Sandy, MD;  Location: [facility];  Service: Cardiovascular;  Laterality: N/A;   AV FISTULA PLACEMENT Left 07/06/2015   Procedure:  INSERTION LEFT ARM ARTERIOVENOUS GORTEX GRAFT;  Surgeon: Angelia Mould, MD;  Location: [facility];  Service: Vascular;  Laterality: Left;   AV FISTULA PLACEMENT Right 02/26/2016   Procedure: ARTERIOVENOUS (AV) FISTULA CREATION ;  Surgeon: Angelia Mould, MD;  Location: [facility];  Service: Vascular;  Laterality: Right;   AV FISTULA PLACEMENT Right 11/25/2018   Procedure: INSERTION OF ARTERIOVENOUS (AV) ARTEGRAFT RIGHT UPPER ARM;  Surgeon: Waynetta Sandy, MD;  Location: [facility];  Service: Vascular;  Laterality: Right;   AV FISTULA PLACEMENT Left 05/28/2021   Procedure: LEFT ARM ARTERIOVENOUS (AV) FISTULA;  Surgeon: Rosetta Posner, MD;  Location: [facility];  Service: Vascular;  Laterality: Left;   [place] REMOVAL Left 10/09/2015   Procedure: REMOVAL OF ARTERIOVENOUS GORETEX GRAFT ([place]) Evacuation of Lymphocele, Vein Patch angioplasty of brachial artery.;  Surgeon: Angelia Mould, MD;  Location: [facility];  Service: Vascular;  Laterality: Left;   BASCILIC VEIN TRANSPOSITION Right 02/26/2016   Procedure: Right BASCILIC VEIN TRANSPOSITION;  Surgeon: Angelia Mould, MD;  Location: [facility];  Service: Vascular;  Laterality: Right;   [place] Left 07/30/2021   Procedure: LEFT ARM SECOND STAGE BASILIC VEIN TRANSPOSITION;  Surgeon: Rosetta Posner, MD;  Location: [facility];  Service: Vascular;  Laterality: Left;   CIRCUMCISION N/A 01/04/2014   Procedure: CIRCUMCISION ADULT (procedure #1);  Surgeon: Marissa Nestle, MD;  Location[facility];  Service: Urology;  Laterality: N/A;   COLECTOMY N/A 05/04/2017   Procedure:  TOTAL COLECTOMY;  Surgeon: Aviva Signs, MD;  Location[facility];  Service: General;  Laterality: N/A;   COLONOSCOPY N/A 04/27/2017   Procedure: COLONOSCOPY;  Surgeon: Daneil Dolin, MD;  [facility];  Service: Endoscopy;  Laterality: N/A;  245   CYSTOSCOPY W/ RETROGRADES Bilateral 06/29/2015   Procedure: CYSTOSCOPY, DILATION OF URETHRAL STRICTURE WITH BILATERAL RETROGRADE PYELOGRAM,SUPRAPUBIC TUBE CHANGE;  Surgeon: Festus Aloe, MD;  Location: [facility];  Service: Urology;  Laterality: Bilateral;   CYSTOSCOPY WITH URETHRAL DILATATION N/A 12/29/2013   Procedure: CYSTOSCOPY WITH URETHRAL DILATATION;  Surgeon: Marissa Nestle, MD;  [facility];  Service: Urology;  Laterality: N/A;   ESOPHAGOGASTRODUODENOSCOPY N/A 04/27/2017   Procedure: ESOPHAGOGASTRODUODENOSCOPY (EGD);  Surgeon: Daneil Dolin, MD;  [facility];  Service: Endoscopy;  Laterality: N/A;   INSERTION OF DIALYSIS CATHETER Right 11/25/2018   Procedure: INSERTION OF DIALYSIS CATHETER RIGHT INTERNAL JUGULAR;  Surgeon: Waynetta Sandy, MD;  Location: [facility];  Service: Vascular;  Laterality: Right;   IR AV DIALY SHUNT INTRO NEEDLE/INTRACATH INITIAL W/PTA/IMG RIGHT Right 09/07/2018   IR FLUORO GUIDE CV LINE RIGHT  10/16/2020   IR REMOVAL TUN CV CATH W/O FL  01/12/2019   IR THROMBECTOMY AV FISTULA W/THROMBOLYSIS/PTA INC/SHUNT/IMG RIGHT Right 04/26/2018   IR US GUIDE VASC ACCESS RIGHT  04/26/2018   IR US GUIDE VASC ACCESS RIGHT  09/07/2018   IR US GUIDE VASC ACCESS RIGHT  10/16/2020   IR US GUIDE VASC ACCESS RIGHT  10/16/2020   ORIF FEMUR FRACTURE Right 11/22/2016   Procedure: OPEN REDUCTION INTERNAL FIXATION (ORIF) DISTAL FEMUR FRACTURE;  Surgeon: Rod Can, MD;  Location: [facility];  Service: Orthopedics;  Laterality: Right;   PATCH ANGIOPLASTY Right 12/10/2017   Procedure: PATCH ANGIOPLASTY;  Surgeon: Angelia Mould, MD;  Location: [facility];  Service: Vascular;  Laterality: Right;   PERIPHERAL VASCULAR  BALLOON ANGIOPLASTY  08/13/2018   Procedure: PERIPHERAL VASCULAR BALLOON ANGIOPLASTY;  Surgeon: Elam Dutch, MD;  Location: [facility] [place] [facility];  Service: Cardiovascular;;  right AV fistula    PERIPHERAL VASCULAR BALLOON ANGIOPLASTY  11/22/2018   Procedure: PERIPHERAL VASCULAR BALLOON ANGIOPLASTY;  Surgeon: Waynetta Sandy, MD;  Location: [facility];  Service: Cardiovascular;;  rt AV fistula   PERIPHERAL VASCULAR CATHETERIZATION N/A 10/08/2015   Procedure: A/V Shuntogram;  Surgeon: Angelia Mould, MD;  Location: [facility];  Service: Cardiovascular;  Laterality: N/A;   THROMBECTOMY W/ EMBOLECTOMY Right 12/10/2017   Procedure: THROMBECTOMY REVISION RIGHT ARM  ARTERIOVENOUS FISTULA;  Surgeon: Angelia Mould, MD;  Location: [facility];  Service: Vascular;  Laterality: Right;   TRANSURETHRAL RESECTION OF PROSTATE N/A 01/04/2014   Procedure: TRANSURETHRAL RESECTION OF THE PROSTATE (TURP) (procedure #2);  Surgeon: Marissa Nestle, MD;  [facility];  Service: Urology;  Laterality: N/A;    Social History   Socioeconomic History  Marital status: Single    Spouse name: Not on file   Number of children: Not on file   Years of education: Not on file   Highest education level: Not on file  Occupational History   Occupation: retired   Tobacco Use   Smoking status: Never   Smokeless tobacco: Never  Vaping Use   Vaping Use: Never used  Substance and Sexual Activity   Alcohol use: No   Drug use: No   Sexual activity: Not Currently  Other Topics Concern   Not on file  Social History Narrative   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   Long term resident of [facility]    Social Determinants of Health   Financial Resource Strain: Not on file  Food Insecurity: Not on file  Transportation Needs: Not on file  Physical Activity: Not on file  Stress: Not on file  Social Connections: Not on file  Intimate Partner  Violence: Not on file   Family History  Problem Relation Age of Onset   Cancer Mother    Colon cancer Neg Hx       VITAL SIGNS BP (!) 144/86   Pulse 74   Temp 97.8 F (36.6 C)   Resp 18   Ht 5\' 8"  (1.727 m)   Wt 214 lb 6.4 oz (97.3 kg)   SpO2 99%   BMI 32.60 kg/m   Outpatient Encounter Medications as of 10/07/2021  Medication Sig   aspirin EC 81 MG tablet Take 81 mg by mouth daily. 0900   atorvastatin (LIPITOR) 80 MG tablet Take 80 mg by mouth at bedtime. 2100   Insulin Glargine (BASAGLAR KWIKPEN) 100 UNIT/ML SOPN Inject 25 Units into the skin at bedtime. 2100 DO NOT HOLD this insulin without provider notification   levothyroxine (SYNTHROID) 125 MCG tablet Take 125 mcg by mouth daily. 2100   midodrine (PROAMATINE) 10 MG tablet Take 10 mg by mouth See admin instructions. Send med with resident on Mon/Wed/Fri to dialysis. do not give at [facility] give to resident to take with him. send with 5mg  to equal 15mg . 11:30   midodrine (PROAMATINE) 5 MG tablet Take 5 mg by mouth See admin instructions. Send with resident on dialysis days.  Mon/Wed/Fri.  Take along with 10 mg to equal 15 mg 11:30   NON FORMULARY Diet:NAS, Cons CHO   omega-3 acid ethyl esters (LOVAZA) 1 g capsule Take 2 g by mouth at bedtime. 2100   omeprazole (PRILOSEC) 40 MG capsule Take 40 mg by mouth daily. 0900   polyethylene glycol (MIRALAX / GLYCOLAX) packet Take 17 g by mouth daily as needed for mild constipation.    sevelamer carbonate (RENVELA) 800 MG tablet Take 2,400 mg by mouth 3 (three) times daily with meals. 0900, 1130, 1800   tamsulosin (FLOMAX) 0.4 MG CAPS capsule Take 0.4 mg by mouth daily at 6 PM. Give 30 minutes after a meal. Do not crush or chew   torsemide (DEMADEX) 10 MG tablet Take 50 mg by mouth daily at 6 PM.   [DISCONTINUED] latanoprost (XALATAN) 0.005 % ophthalmic solution Place 1 drop into both eyes at bedtime. 2100   No facility-administered encounter medications on file as of 10/07/2021.      SIGNIFICANT DIAGNOSTIC EXAMS  PREVIOUS   05-02-21: Chest x-ray: dialysis cath demonstrated on right. No acute cardiopulmonary process  NO NEW EXAMS.    LABS REVIEWED PREVIOUS;  10-12-20: chol 76; ldl 64; trig 319; hdl 24; hgb a1c 7.4  10-17-20:  wbc 8.2; hgb 11.0; hct 34.3; mcv 108.2 plt 121; glucose 141; bun 51; creat 13.25; k+ 3.9; na++ 138; ca 8.5 mag 2.1  01-28-21: hgb a1c 6.5  03-07-21: tsh 5.008  05-02-21: wbc 3.8; hgb 10.9; hct 33.7; mcv 105.6 plt 106; glucose 105; bun 17; creat 7.94; k+ 3.8; na++ 138; ca 9.1; GFR 7; d-dimer: 0.95; CRP 0.7 06-12-21: tsh 5.269  06-27-21: hgb a1c 5.6 07-30-21: hgb 11.6; hct 34.0; glucose 104; bun 12; creat 7.30; k+ 5.2; na++ 135; ionized ca++ 0.8  08-12-21: tsh 0.822  NO NEW LABS.    Review of Systems  Constitutional:  Negative for malaise/fatigue.  Respiratory:  Negative for cough and shortness of breath.   Cardiovascular:  Negative for chest pain, palpitations and leg swelling.  Gastrointestinal:  Negative for abdominal pain, constipation and heartburn.  Musculoskeletal:  Negative for back pain, joint pain and myalgias.  Skin: Negative.   Neurological:  Negative for dizziness.  Psychiatric/Behavioral:  The patient is not nervous/anxious.     Physical Exam Constitutional:      General: Zachary [name] is not in acute distress.    Appearance: Zachary [name] is well-developed. Zachary [name] is not diaphoretic.  Neck:     Thyroid: No thyromegaly.  Cardiovascular:     Rate and Rhythm: Normal rate and regular rhythm.     Pulses: Normal pulses.     Heart sounds: Normal heart sounds.  Pulmonary:     Effort: Pulmonary effort is normal. No respiratory distress.     Breath sounds: Normal breath sounds.  Abdominal:     General: Bowel sounds are normal. There is no distension.     Palpations: Abdomen is soft.     Tenderness: There is no abdominal tenderness.  Genitourinary:    Comments: Hx turp Musculoskeletal:        General: Normal range of motion.     Cervical back: Neck  supple.     Right lower leg: No edema.     Left lower leg: No edema.     Comments:  History of right femur ORIF    Lymphadenopathy:     Cervical: No cervical adenopathy.  Skin:    General: Skin is warm and dry.  Neurological:     Mental Status: Zachary [name] is alert. Mental status is at baseline.  Psychiatric:        Mood and Affect: Mood normal.    ASSESSMENT/ PLAN:  TODAY  GERD without esophagitis: is stable will continue prilosec 40 mg daily   2. History of colon cancer: is stable is status post colectomy 05-04-17  3. Dyslipidemia associated with type 2 diabetes mellitus: LDL 64 trig 319 will continue lipitor 80 mg daily and lovaza 2 gm daily   PREVIOUS   4. 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 Will continue midodrine 15 mg with dialysis 1200 cc fluid restriction; renvela 1600 mg with meals 800 mg with snacks  5. Other specified hypothyroidism: is stable tsh 0.822 will continue synthroid 125 mcg daily   6. 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 Major depression single episode moderate: is stable is off medications will monitor   8. Other specified hypothyroidism: is stable tsh 0.822 will continue synthroid 125 mcg daily   9. Anemia of chronic renal disease on chronic dialysis: is stable hgb 11.0 will monitor   10. Major  depression single episode moderate: is stable is off medications will monitor : (ct 2019) will monitor      Ok Edwards NP [facility]  [phone]

## 2021-10-08 DIAGNOSIS — N186 End stage renal disease: Secondary | ICD-10-CM | POA: Diagnosis not present

## 2021-10-08 DIAGNOSIS — D631 Anemia in chronic kidney disease: Secondary | ICD-10-CM | POA: Diagnosis not present

## 2021-10-08 DIAGNOSIS — Z992 Dependence on renal dialysis: Secondary | ICD-10-CM | POA: Diagnosis not present

## 2021-10-08 DIAGNOSIS — N2581 Secondary hyperparathyroidism of renal origin: Secondary | ICD-10-CM | POA: Diagnosis not present

## 2021-10-08 DIAGNOSIS — D509 Iron deficiency anemia, unspecified: Secondary | ICD-10-CM | POA: Diagnosis not present

## 2021-10-10 DIAGNOSIS — Z992 Dependence on renal dialysis: Secondary | ICD-10-CM | POA: Diagnosis not present

## 2021-10-10 DIAGNOSIS — N2581 Secondary hyperparathyroidism of renal origin: Secondary | ICD-10-CM | POA: Diagnosis not present

## 2021-10-10 DIAGNOSIS — D509 Iron deficiency anemia, unspecified: Secondary | ICD-10-CM | POA: Diagnosis not present

## 2021-10-10 DIAGNOSIS — D631 Anemia in chronic kidney disease: Secondary | ICD-10-CM | POA: Diagnosis not present

## 2021-10-10 DIAGNOSIS — N186 End stage renal disease: Secondary | ICD-10-CM | POA: Diagnosis not present

## 2021-10-12 DIAGNOSIS — D509 Iron deficiency anemia, unspecified: Secondary | ICD-10-CM | POA: Diagnosis not present

## 2021-10-12 DIAGNOSIS — D631 Anemia in chronic kidney disease: Secondary | ICD-10-CM | POA: Diagnosis not present

## 2021-10-12 DIAGNOSIS — N2581 Secondary hyperparathyroidism of renal origin: Secondary | ICD-10-CM | POA: Diagnosis not present

## 2021-10-12 DIAGNOSIS — Z992 Dependence on renal dialysis: Secondary | ICD-10-CM | POA: Diagnosis not present

## 2021-10-12 DIAGNOSIS — N186 End stage renal disease: Secondary | ICD-10-CM | POA: Diagnosis not present

## 2021-10-15 DIAGNOSIS — D631 Anemia in chronic kidney disease: Secondary | ICD-10-CM | POA: Diagnosis not present

## 2021-10-15 DIAGNOSIS — F4322 Adjustment disorder with anxiety: Secondary | ICD-10-CM | POA: Diagnosis not present

## 2021-10-15 DIAGNOSIS — F70 Mild intellectual disabilities: Secondary | ICD-10-CM | POA: Diagnosis not present

## 2021-10-15 DIAGNOSIS — N2581 Secondary hyperparathyroidism of renal origin: Secondary | ICD-10-CM | POA: Diagnosis not present

## 2021-10-15 DIAGNOSIS — D509 Iron deficiency anemia, unspecified: Secondary | ICD-10-CM | POA: Diagnosis not present

## 2021-10-15 DIAGNOSIS — N186 End stage renal disease: Secondary | ICD-10-CM | POA: Diagnosis not present

## 2021-10-15 DIAGNOSIS — Z992 Dependence on renal dialysis: Secondary | ICD-10-CM | POA: Diagnosis not present

## 2021-10-17 DIAGNOSIS — D631 Anemia in chronic kidney disease: Secondary | ICD-10-CM | POA: Diagnosis not present

## 2021-10-17 DIAGNOSIS — N186 End stage renal disease: Secondary | ICD-10-CM | POA: Diagnosis not present

## 2021-10-17 DIAGNOSIS — D509 Iron deficiency anemia, unspecified: Secondary | ICD-10-CM | POA: Diagnosis not present

## 2021-10-17 DIAGNOSIS — Z992 Dependence on renal dialysis: Secondary | ICD-10-CM | POA: Diagnosis not present

## 2021-10-17 DIAGNOSIS — N2581 Secondary hyperparathyroidism of renal origin: Secondary | ICD-10-CM | POA: Diagnosis not present

## 2021-10-19 DIAGNOSIS — N2581 Secondary hyperparathyroidism of renal origin: Secondary | ICD-10-CM | POA: Diagnosis not present

## 2021-10-19 DIAGNOSIS — D631 Anemia in chronic kidney disease: Secondary | ICD-10-CM | POA: Diagnosis not present

## 2021-10-19 DIAGNOSIS — N186 End stage renal disease: Secondary | ICD-10-CM | POA: Diagnosis not present

## 2021-10-19 DIAGNOSIS — D509 Iron deficiency anemia, unspecified: Secondary | ICD-10-CM | POA: Diagnosis not present

## 2021-10-19 DIAGNOSIS — Z992 Dependence on renal dialysis: Secondary | ICD-10-CM | POA: Diagnosis not present

## 2021-10-22 DIAGNOSIS — D509 Iron deficiency anemia, unspecified: Secondary | ICD-10-CM | POA: Diagnosis not present

## 2021-10-22 DIAGNOSIS — N2581 Secondary hyperparathyroidism of renal origin: Secondary | ICD-10-CM | POA: Diagnosis not present

## 2021-10-22 DIAGNOSIS — Z992 Dependence on renal dialysis: Secondary | ICD-10-CM | POA: Diagnosis not present

## 2021-10-22 DIAGNOSIS — D631 Anemia in chronic kidney disease: Secondary | ICD-10-CM | POA: Diagnosis not present

## 2021-10-22 DIAGNOSIS — N186 End stage renal disease: Secondary | ICD-10-CM | POA: Diagnosis not present

## 2021-10-24 DIAGNOSIS — N2581 Secondary hyperparathyroidism of renal origin: Secondary | ICD-10-CM | POA: Diagnosis not present

## 2021-10-24 DIAGNOSIS — Z992 Dependence on renal dialysis: Secondary | ICD-10-CM | POA: Diagnosis not present

## 2021-10-24 DIAGNOSIS — D631 Anemia in chronic kidney disease: Secondary | ICD-10-CM | POA: Diagnosis not present

## 2021-10-24 DIAGNOSIS — N186 End stage renal disease: Secondary | ICD-10-CM | POA: Diagnosis not present

## 2021-10-24 DIAGNOSIS — D509 Iron deficiency anemia, unspecified: Secondary | ICD-10-CM | POA: Diagnosis not present

## 2021-10-26 DIAGNOSIS — N2581 Secondary hyperparathyroidism of renal origin: Secondary | ICD-10-CM | POA: Diagnosis not present

## 2021-10-26 DIAGNOSIS — Z992 Dependence on renal dialysis: Secondary | ICD-10-CM | POA: Diagnosis not present

## 2021-10-26 DIAGNOSIS — D509 Iron deficiency anemia, unspecified: Secondary | ICD-10-CM | POA: Diagnosis not present

## 2021-10-26 DIAGNOSIS — N186 End stage renal disease: Secondary | ICD-10-CM | POA: Diagnosis not present

## 2021-10-26 DIAGNOSIS — D631 Anemia in chronic kidney disease: Secondary | ICD-10-CM | POA: Diagnosis not present

## 2021-10-29 DIAGNOSIS — N186 End stage renal disease: Secondary | ICD-10-CM | POA: Diagnosis not present

## 2021-10-29 DIAGNOSIS — D509 Iron deficiency anemia, unspecified: Secondary | ICD-10-CM | POA: Diagnosis not present

## 2021-10-29 DIAGNOSIS — Z992 Dependence on renal dialysis: Secondary | ICD-10-CM | POA: Diagnosis not present

## 2021-10-29 DIAGNOSIS — D631 Anemia in chronic kidney disease: Secondary | ICD-10-CM | POA: Diagnosis not present

## 2021-10-29 DIAGNOSIS — N2581 Secondary hyperparathyroidism of renal origin: Secondary | ICD-10-CM | POA: Diagnosis not present

## 2021-10-31 DIAGNOSIS — N186 End stage renal disease: Secondary | ICD-10-CM | POA: Diagnosis not present

## 2021-10-31 DIAGNOSIS — I120 Hypertensive chronic kidney disease with stage 5 chronic kidney disease or end stage renal disease: Secondary | ICD-10-CM | POA: Diagnosis not present

## 2021-10-31 DIAGNOSIS — Z992 Dependence on renal dialysis: Secondary | ICD-10-CM | POA: Diagnosis not present

## 2021-10-31 DIAGNOSIS — D509 Iron deficiency anemia, unspecified: Secondary | ICD-10-CM | POA: Diagnosis not present

## 2021-10-31 DIAGNOSIS — N2581 Secondary hyperparathyroidism of renal origin: Secondary | ICD-10-CM | POA: Diagnosis not present

## 2021-10-31 DIAGNOSIS — Z1159 Encounter for screening for other viral diseases: Secondary | ICD-10-CM | POA: Diagnosis not present

## 2021-10-31 DIAGNOSIS — E1129 Type 2 diabetes mellitus with other diabetic kidney complication: Secondary | ICD-10-CM | POA: Diagnosis not present

## 2021-10-31 DIAGNOSIS — D631 Anemia in chronic kidney disease: Secondary | ICD-10-CM | POA: Diagnosis not present

## 2021-11-02 DIAGNOSIS — N186 End stage renal disease: Secondary | ICD-10-CM | POA: Diagnosis not present

## 2021-11-02 DIAGNOSIS — D509 Iron deficiency anemia, unspecified: Secondary | ICD-10-CM | POA: Diagnosis not present

## 2021-11-02 DIAGNOSIS — Z992 Dependence on renal dialysis: Secondary | ICD-10-CM | POA: Diagnosis not present

## 2021-11-02 DIAGNOSIS — N2581 Secondary hyperparathyroidism of renal origin: Secondary | ICD-10-CM | POA: Diagnosis not present

## 2021-11-02 DIAGNOSIS — D631 Anemia in chronic kidney disease: Secondary | ICD-10-CM | POA: Diagnosis not present

## 2021-11-05 DIAGNOSIS — N2581 Secondary hyperparathyroidism of renal origin: Secondary | ICD-10-CM | POA: Diagnosis not present

## 2021-11-05 DIAGNOSIS — N186 End stage renal disease: Secondary | ICD-10-CM | POA: Diagnosis not present

## 2021-11-05 DIAGNOSIS — E119 Type 2 diabetes mellitus without complications: Secondary | ICD-10-CM | POA: Diagnosis not present

## 2021-11-05 DIAGNOSIS — Z992 Dependence on renal dialysis: Secondary | ICD-10-CM | POA: Diagnosis not present

## 2021-11-05 DIAGNOSIS — D631 Anemia in chronic kidney disease: Secondary | ICD-10-CM | POA: Diagnosis not present

## 2021-11-05 DIAGNOSIS — D509 Iron deficiency anemia, unspecified: Secondary | ICD-10-CM | POA: Diagnosis not present

## 2021-11-07 ENCOUNTER — Encounter (HOSPITAL_COMMUNITY)
Admission: RE | Admit: 2021-11-07 | Discharge: 2021-11-07 | Disposition: A | Discharge status: Home / Self Care | Payer: Medicare Other | Source: Skilled Nursing Facility | Attending: Adult Health | Admitting: Adult Health

## 2021-11-07 ENCOUNTER — Non-Acute Institutional Stay (SKILLED_NURSING_FACILITY): Payer: Medicare Other | Admitting: Internal Medicine

## 2021-11-07 ENCOUNTER — Encounter: Payer: Self-pay | Admitting: Internal Medicine

## 2021-11-07 DIAGNOSIS — N189 Chronic kidney disease, unspecified: Secondary | ICD-10-CM | POA: Insufficient documentation

## 2021-11-07 DIAGNOSIS — E1169 Type 2 diabetes mellitus with other specified complication: Secondary | ICD-10-CM | POA: Diagnosis not present

## 2021-11-07 DIAGNOSIS — D631 Anemia in chronic kidney disease: Secondary | ICD-10-CM | POA: Diagnosis not present

## 2021-11-07 DIAGNOSIS — D696 Thrombocytopenia, unspecified: Secondary | ICD-10-CM

## 2021-11-07 DIAGNOSIS — Z992 Dependence on renal dialysis: Secondary | ICD-10-CM

## 2021-11-07 DIAGNOSIS — N2581 Secondary hyperparathyroidism of renal origin: Secondary | ICD-10-CM | POA: Diagnosis not present

## 2021-11-07 DIAGNOSIS — N186 End stage renal disease: Secondary | ICD-10-CM | POA: Diagnosis not present

## 2021-11-07 DIAGNOSIS — C189 Malignant neoplasm of colon, unspecified: Secondary | ICD-10-CM

## 2021-11-07 DIAGNOSIS — E785 Hyperlipidemia, unspecified: Secondary | ICD-10-CM | POA: Diagnosis not present

## 2021-11-07 DIAGNOSIS — E1122 Type 2 diabetes mellitus with diabetic chronic kidney disease: Secondary | ICD-10-CM | POA: Insufficient documentation

## 2021-11-07 DIAGNOSIS — E034 Atrophy of thyroid (acquired): Secondary | ICD-10-CM | POA: Diagnosis not present

## 2021-11-07 DIAGNOSIS — I120 Hypertensive chronic kidney disease with stage 5 chronic kidney disease or end stage renal disease: Secondary | ICD-10-CM | POA: Diagnosis not present

## 2021-11-07 DIAGNOSIS — Z1159 Encounter for screening for other viral diseases: Secondary | ICD-10-CM | POA: Diagnosis not present

## 2021-11-07 DIAGNOSIS — D509 Iron deficiency anemia, unspecified: Secondary | ICD-10-CM | POA: Diagnosis not present

## 2021-11-07 DIAGNOSIS — E1129 Type 2 diabetes mellitus with other diabetic kidney complication: Secondary | ICD-10-CM | POA: Diagnosis not present

## 2021-11-07 DIAGNOSIS — I700 Atherosclerosis of aorta: Secondary | ICD-10-CM

## 2021-11-07 LAB — COMPREHENSIVE METABOLIC PANEL
ALT: 20 U/L (ref 0–44)
AST: 17 U/L (ref 15–41)
Albumin: 3.1 g/dL — ABNORMAL LOW (ref 3.5–5.0)
Alkaline Phosphatase: 78 U/L (ref 38–126)
Anion gap: 7 (ref 5–15)
BUN: 28 mg/dL — ABNORMAL HIGH (ref 8–23)
CO2: 26 mmol/L (ref 22–32)
Calcium: 8.6 mg/dL — ABNORMAL LOW (ref 8.9–10.3)
Chloride: 105 mmol/L (ref 98–111)
Creatinine, Ser: 9.01 mg/dL — ABNORMAL HIGH (ref 0.61–1.24)
GFR, Estimated: 6 mL/min — ABNORMAL LOW (ref >60)
Glucose, Bld: 129 mg/dL — ABNORMAL HIGH (ref 70–99)
Potassium: 4.5 mmol/L (ref 3.5–5.1)
Sodium: 138 mmol/L (ref 135–145)
Total Bilirubin: 0.6 mg/dL (ref 0.3–1.2)
Total Protein: 6.2 g/dL — ABNORMAL LOW (ref 6.5–8.1)

## 2021-11-07 LAB — CBC
HCT: 29.5 % — ABNORMAL LOW (ref 39.0–52.0)
Hemoglobin: 9.5 g/dL — ABNORMAL LOW (ref 13.0–17.0)
MCH: 33.5 pg (ref 26.0–34.0)
MCHC: 32.2 g/dL (ref 30.0–36.0)
MCV: 103.9 fL — ABNORMAL HIGH (ref 80.0–100.0)
Platelets: 107 10*3/uL — ABNORMAL LOW (ref 150–400)
RBC: 2.84 MIL/uL — ABNORMAL LOW (ref 4.22–5.81)
RDW: 12.8 % (ref 11.5–15.5)
WBC: 4.5 10*3/uL (ref 4.0–10.5)
nRBC: 0 % (ref 0.0–0.2)

## 2021-11-07 LAB — TSH: TSH: 1.538 u[IU]/mL (ref 0.350–4.500)

## 2021-11-07 LAB — LIPID PANEL
Cholesterol: 60 mg/dL (ref 0–200)
HDL: 28 mg/dL — ABNORMAL LOW (ref >40)
LDL Cholesterol: 18 mg/dL (ref 0–99)
Total CHOL/HDL Ratio: 2.1 RATIO
Triglycerides: 71 mg/dL (ref <150)
VLDL: 14 mg/dL (ref 0–40)

## 2021-11-07 LAB — HEMOGLOBIN A1C
Hgb A1c MFr Bld: 5.8 % — ABNORMAL HIGH (ref 4.8–5.6)
Mean Plasma Glucose: 119.76 mg/dL

## 2021-11-07 NOTE — Progress Notes (Signed)
NURSING HOME LOCATION:  [facility] ROOM NUMBER:    CODE STATUS:  Full Code  PCP: Ok Edwards NP,PSC  This is a nursing facility follow up visit of chronic medical diagnoses & to document compliance with Regulation 483.30 (c) in [employer] [place] Manual Phase 2 which mandates caregiver visit ( visits can alternate among physician, PA or NP as per statutes) within 10 days of 30 days / 60 days/ 90 days post admission to SNF date    Interim medical record and care since last SNF visit was updated with review of diagnostic studies and change in clinical status since last visit were documented.  HPI: He is a permanent resident of facility with medical diagnoses of history of colon cancer, dyslipidemia, essential hypertension, hypothyroidism, protein/caloric malnutrition, and diabetes with ESRD.  He is on hemodialysis 3 days a week.  Surgeries include colectomy, TURP, and multiple vascular procedures related to the ESRD.  Most recent labs in [place] revealed a creatinine of 9.01 with a GFR of 6 indicating end-stage renal disease.  Lipids revealed an LDL of 18.  Chronic macrocytic anemia is present with the most recent H/H 9.5/29.5 with MCV of 103.9.  He also has thrombocytopenia with a platelet count of 107,000. He did have an A1c performed on 06/27/2021 which revealed a value of 5.6% .glucoses at the facility ranged from a low of 51 up to 146.  Averages 92.  TSH on 08/12/2021 was 0.822.  Review of systems: His responses to all review of systems questions was a monosyllabic "no" without elaboration.  He stated "I am fine".  He does confirm that he has oliguria the context of hemodialysis dependent ESRD.  Constitutional: No fever, significant weight change, fatigue  Eyes: No redness, discharge, pain, vision change ENT/mouth: No nasal congestion,  purulent discharge, earache, change in hearing, sore throat  Cardiovascular: No chest pain, palpitations, paroxysmal nocturnal  dyspnea, claudication, edema  Respiratory: No cough, sputum production, hemoptysis, DOE, significant snoring, apnea   Gastrointestinal: No heartburn, dysphagia, abdominal pain, nausea /vomiting, rectal bleeding, melena, change in bowels Genitourinary: No dysuria, hematuria, pyuria, incontinence, nocturia Musculoskeletal: No joint stiffness, joint swelling, weakness, pain Dermatologic: No rash, pruritus, change in appearance of skin Neurologic: No dizziness, headache, syncope, seizures, numbness, tingling Psychiatric: No significant anxiety, depression, insomnia, anorexia Endocrine: No change in hair/skin/nails, excessive thirst, excessive hunger, excessive urination  Hematologic/lymphatic: No significant bruising, lymphadenopathy, abnormal bleeding Allergy/immunology: No itchy/watery eyes, significant sneezing, urticaria, angioedema  Physical exam:  Pertinent or positive findings: Arcus senilis is present.  He is missing anterior maxillary teeth.  The remaining teeth or malalignment with dense plaque coating.  There is suggestion of a basilar faint systolic murmur.  He has minor diffuse low-grade rales..  Abdomen is protuberant.  He has trace edema at the sock line.  Pedal pulses are palpable.  General appearance: Adequately nourished; no acute distress, increased work of breathing is present.   Lymphatic: No lymphadenopathy about the head, neck, axilla. Eyes: No conjunctival inflammation or lid edema is present. There is no scleral icterus. Ears:  External ear exam shows no significant lesions or deformities.   Nose:  External nasal examination shows no deformity or inflammation. Nasal mucosa are pink and moist without lesions, exudates Neck:  No thyromegaly, masses, tenderness noted.    Heart:  Normal rate and regular rhythm. S1 and S2 normal without gallop, click, rub .  Lungs:  without wheezes, rhonchi,rubs. Abdomen: Bowel sounds are normal. Abdomen is soft  and nontender with no  organomegaly, hernias, masses. GU: Deferred  Extremities:  No cyanosis, clubbing  Neurologic exam :Balance, Rhomberg, finger to nose testing could not be completed due to clinical state Skin: Warm & dry w/o tenting. No significant visible lesions or rash.  See summary under each active problem in the Problem List with associated updated therapeutic plan

## 2021-11-08 ENCOUNTER — Encounter: Payer: Self-pay | Admitting: Internal Medicine

## 2021-11-08 NOTE — Assessment & Plan Note (Signed)
Current platelet count is 107,000.  No bleeding dyscrasias reported at the SNF.  Monitor will continue.

## 2021-11-08 NOTE — Assessment & Plan Note (Signed)
LDL is 18 on 80 mg of atorvastatin.  LDL goal would be less than 70.  Either decreasing the atorvastatin dose or changing to low-dose rosuvastatin will be discussed with the [facility] NP.

## 2021-11-08 NOTE — Patient Instructions (Signed)
See assessment and plan under each diagnosis in the problem list and acutely for this visit 

## 2021-11-08 NOTE — Assessment & Plan Note (Addendum)
TSH on 08/02/2021 was 0.822 on 125 mcg of L-thyroxine.  Decreasing the dose to 100 mcg will be discussed with the [facility] NP if TSH remains < 1.00.

## 2021-11-08 NOTE — Assessment & Plan Note (Signed)
Maintenance hemodialysis Tuesday, Thursday, and Saturday will continue.

## 2021-11-08 NOTE — Assessment & Plan Note (Signed)
GI follow-up scheduled in the near future.

## 2021-11-08 NOTE — Assessment & Plan Note (Signed)
He denies any anginal symptoms at present.

## 2021-11-09 DIAGNOSIS — D631 Anemia in chronic kidney disease: Secondary | ICD-10-CM | POA: Diagnosis not present

## 2021-11-09 DIAGNOSIS — N186 End stage renal disease: Secondary | ICD-10-CM | POA: Diagnosis not present

## 2021-11-09 DIAGNOSIS — Z992 Dependence on renal dialysis: Secondary | ICD-10-CM | POA: Diagnosis not present

## 2021-11-09 DIAGNOSIS — N2581 Secondary hyperparathyroidism of renal origin: Secondary | ICD-10-CM | POA: Diagnosis not present

## 2021-11-09 DIAGNOSIS — D509 Iron deficiency anemia, unspecified: Secondary | ICD-10-CM | POA: Diagnosis not present

## 2021-11-11 ENCOUNTER — Non-Acute Institutional Stay (SKILLED_NURSING_FACILITY): Payer: Medicare Other | Admitting: Adult Health

## 2021-11-11 ENCOUNTER — Encounter: Payer: Self-pay | Admitting: Adult Health

## 2021-11-11 DIAGNOSIS — Z794 Long term (current) use of insulin: Secondary | ICD-10-CM

## 2021-11-11 DIAGNOSIS — D696 Thrombocytopenia, unspecified: Secondary | ICD-10-CM

## 2021-11-11 DIAGNOSIS — E11319 Type 2 diabetes mellitus with unspecified diabetic retinopathy without macular edema: Secondary | ICD-10-CM | POA: Diagnosis not present

## 2021-11-11 DIAGNOSIS — E785 Hyperlipidemia, unspecified: Secondary | ICD-10-CM

## 2021-11-11 DIAGNOSIS — E1169 Type 2 diabetes mellitus with other specified complication: Secondary | ICD-10-CM | POA: Diagnosis not present

## 2021-11-11 NOTE — Progress Notes (Signed)
Location:  [place] Room Number: 112-D Place of Service:  SNF (31)   CODE STATUS: Full Code  No Known Allergies  Chief Complaint  Patient presents with   Acute Visit    Follow-up labs    HPI:  His hgb a1c is 5.8. his AM cbg readings are less than 100. His ldl is 18. His plt count is 107. There are no reports of excessive thirst or hunger. There are no reports of bleeding present.   Past Medical History:  Diagnosis Date   Abnormal CT scan, kidney 10/06/2011   Acute pyelonephritis 10/07/2011   Anemia    normocytic   Anxiety    mental retardation   Bladder wall thickening 10/06/2011   BPH (benign prostatic hypertrophy)    Diabetes mellitus    Dialysis patient ([place])    Tuesday, Thursday and Saturday,    DVT of leg (deep venous thrombosis) ([place]) 12/25/2016   Edema     history of lower extremity edema   GERD (gastroesophageal reflux disease)    Heme positive stool    Hydronephrosis    Hyperkalemia    Hyperlipidemia    Hypernatremia    Hypertension    Hypothyroidism    Impaired speech    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[place])    MR (mental retardation)    Muscle weakness    Obstructive uropathy    Perinephric abscess 10/07/2011   Poor historian poor historian   Protein calorie malnutrition ([place])    Pyelonephritis    Renal failure (ARF), acute on chronic (HCC)    Renal insufficiency    chronic history   Sepsis ([place])    Smoking    Uremia    Urinary retention    UTI (lower urinary tract infection) 10/06/2011    Past Surgical History:  Procedure Laterality Date   A/V FISTULAGRAM N/A 08/13/2018   Procedure: A/V FISTULAGRAM - Right Upper;  Surgeon: Elam Dutch, MD;  Location: [place] [facility];  Service: Cardiovascular;  Laterality: N/A;   A/V FISTULAGRAM N/A 11/22/2018   Procedure: A/V FISTULAGRAM - Right Upper;  Surgeon: Waynetta Sandy, MD;  Location: [place] [facility];  Service: Cardiovascular;  Laterality: N/A;    AV FISTULA PLACEMENT Left 07/06/2015   Procedure:  INSERTION LEFT ARM ARTERIOVENOUS GORTEX GRAFT;  Surgeon: Angelia Mould, MD;  Location: [place];  Service: Vascular;  Laterality: Left;   AV FISTULA PLACEMENT Right 02/26/2016   Procedure: ARTERIOVENOUS (AV) FISTULA CREATION ;  Surgeon: Angelia Mould, MD;  Location: [place];  Service: Vascular;  Laterality: Right;   AV FISTULA PLACEMENT Right 11/25/2018   Procedure: INSERTION OF ARTERIOVENOUS (AV) ARTEGRAFT RIGHT UPPER ARM;  Surgeon: Waynetta Sandy, MD;  Location: [place];  Service: Vascular;  Laterality: Right;   AV FISTULA PLACEMENT Left 05/28/2021   Procedure: LEFT ARM ARTERIOVENOUS (AV) FISTULA;  Surgeon: Rosetta Posner, MD;  Location[facility];  Service: Vascular;  Laterality: Left;   [place] REMOVAL Left 10/09/2015   Procedure: REMOVAL OF ARTERIOVENOUS GORETEX GRAFT ([place]) Evacuation of Lymphocele, Vein Patch angioplasty of brachial artery.;  Surgeon: Angelia Mould, MD;  Location: [place];  Service: Vascular;  Laterality: Left;   [place] Right 02/26/2016   Procedure: Right [place];  Surgeon: Angelia Mould, MD;  Location: [place];  Service: Vascular;  Laterality: Right;   [place] Left 07/30/2021   Procedure: LEFT ARM SECOND STAGE BASILIC VEIN TRANSPOSITION;  Surgeon: Donnetta Hutching,  Arvilla Meres, MD;  Location: [facility];  Service: Vascular;  Laterality: Left;   CIRCUMCISION N/A 01/04/2014   Procedure: CIRCUMCISION ADULT (procedure #1);  Surgeon: Marissa Nestle, MD;  Location: [facility];  Service: Urology;  Laterality: N/A;   COLECTOMY N/A 05/04/2017   Procedure: TOTAL COLECTOMY;  Surgeon: Aviva Signs, MD;  Location: [facility];  Service: General;  Laterality: N/A;   COLONOSCOPY N/A 04/27/2017   Procedure: COLONOSCOPY;  Surgeon: Daneil Dolin, MD;  [facility];  Service: Endoscopy;  Laterality: N/A;  245   CYSTOSCOPY W/ RETROGRADES Bilateral 06/29/2015   Procedure: CYSTOSCOPY,  DILATION OF URETHRAL STRICTURE WITH BILATERAL RETROGRADE PYELOGRAM,SUPRAPUBIC TUBE CHANGE;  Surgeon: Festus Aloe, MD;  [facility];  Service: Urology;  Laterality: Bilateral;   CYSTOSCOPY WITH URETHRAL DILATATION N/A 12/29/2013   Procedure: CYSTOSCOPY WITH URETHRAL DILATATION;  Surgeon: Marissa Nestle, MD;  Location[facility];  Service: Urology;  Laterality: N/A;   ESOPHAGOGASTRODUODENOSCOPY N/A 04/27/2017   Procedure: ESOPHAGOGASTRODUODENOSCOPY (EGD);  Surgeon: Daneil Dolin, MD;  [facility];  Service: Endoscopy;  Laterality: N/A;   INSERTION OF DIALYSIS CATHETER Right 11/25/2018   Procedure: INSERTION OF DIALYSIS CATHETER RIGHT INTERNAL JUGULAR;  Surgeon: Waynetta Sandy, MD;  Location: [place];  Service: Vascular;  Laterality: Right;   IR AV DIALY SHUNT INTRO NEEDLE/INTRACATH INITIAL W/PTA/IMG RIGHT Right 09/07/2018   IR FLUORO GUIDE CV LINE RIGHT  10/16/2020   IR REMOVAL TUN CV CATH W/O FL  01/12/2019   IR THROMBECTOMY AV FISTULA W/THROMBOLYSIS/PTA INC/SHUNT/IMG RIGHT Right 04/26/2018   IR US GUIDE VASC ACCESS RIGHT  04/26/2018   IR US GUIDE VASC ACCESS RIGHT  09/07/2018   IR US GUIDE VASC ACCESS RIGHT  10/16/2020   IR US GUIDE VASC ACCESS RIGHT  10/16/2020   ORIF FEMUR FRACTURE Right 11/22/2016   Procedure: OPEN REDUCTION INTERNAL FIXATION (ORIF) DISTAL FEMUR FRACTURE;  Surgeon: Rod Can, MD;  Location: [place];  Service: Orthopedics;  Laterality: Right;   PATCH ANGIOPLASTY Right 12/10/2017   Procedure: PATCH ANGIOPLASTY;  Surgeon: Angelia Mould, MD;  Location: [place];  Service: Vascular;  Laterality: Right;   PERIPHERAL VASCULAR BALLOON ANGIOPLASTY  08/13/2018   Procedure: PERIPHERAL VASCULAR BALLOON ANGIOPLASTY;  Surgeon: Elam Dutch, MD;  Location: [place] [facility];  Service: Cardiovascular;;  right AV fistula    PERIPHERAL VASCULAR BALLOON ANGIOPLASTY  11/22/2018   Procedure: PERIPHERAL VASCULAR BALLOON ANGIOPLASTY;  Surgeon: Waynetta Sandy, MD;  Location: [place] [facility];  Service: Cardiovascular;;  rt AV fistula   PERIPHERAL VASCULAR CATHETERIZATION N/A 10/08/2015   Procedure: A/V Shuntogram;  Surgeon: Angelia Mould, MD;  Location: [place] [facility];  Service: Cardiovascular;  Laterality: N/A;   THROMBECTOMY W/ EMBOLECTOMY Right 12/10/2017   Procedure: THROMBECTOMY REVISION RIGHT ARM  ARTERIOVENOUS FISTULA;  Surgeon: Angelia Mould, MD;  Location: [facility];  Service: Vascular;  Laterality: Right;   TRANSURETHRAL RESECTION OF PROSTATE N/A 01/04/2014   Procedure: TRANSURETHRAL RESECTION OF THE PROSTATE (TURP) (procedure #2);  Surgeon: Marissa Nestle, MD;  [facility];  Service: Urology;  Laterality: N/A;    Social History   Socioeconomic History   Marital status: Single    Spouse name: Not on file   Number of children: Not on file   Years of education: Not on file   Highest education level: Not on file  Occupational History   Occupation: retired   Tobacco Use   Smoking status: Never  Smokeless tobacco: Never  Vaping Use   Vaping Use: Never used  Substance and Sexual Activity   Alcohol use: No   Drug use: No   Sexual activity: Not Currently  Other Topics Concern   Not on file  Social History Narrative   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   [facility] resident of [facility]    Social Determinants of Health   Financial Resource Strain: Not on file  Food Insecurity: Not on file  Transportation Needs: Not on file  Physical Activity: Not on file  Stress: Not on file  Social Connections: Not on file  Intimate Partner Violence: Not on file   Family History  Problem Relation Age of Onset   Cancer Mother    Colon cancer Neg Hx       VITAL SIGNS BP 127/80    Pulse 72    Temp 97.7 F (36.5 C)    Resp 18    Ht 5\' 8"  (1.727 m)    Wt 215 lb (97.5 kg)    SpO2 99%    BMI 32.69 kg/m   Outpatient Encounter Medications as of 11/11/2021  Medication  Sig   aspirin EC 81 MG tablet Take 81 mg by mouth daily. 0900   atorvastatin (LIPITOR) 80 MG tablet Take 80 mg by mouth at bedtime. 2100   Insulin Glargine (BASAGLAR KWIKPEN) 100 UNIT/ML SOPN Inject 25 Units into the skin at bedtime. 2100 DO NOT HOLD this insulin without provider notification   levothyroxine (SYNTHROID) 125 MCG tablet Take 125 mcg by mouth daily. 2100   midodrine (PROAMATINE) 10 MG tablet Take 10 mg by mouth See admin instructions. Send med with resident on Mon/Wed/Fri to dialysis. do not give [facility] give to resident to take with him. send with 5mg  to equal 15mg . 11:30   midodrine (PROAMATINE) 5 MG tablet Take 5 mg by mouth See admin instructions. Send with resident on dialysis days.  Mon/Wed/Fri.  Take along with 10 mg to equal 15 mg 11:30   NON FORMULARY Diet:NAS, Cons CHO   omega-3 acid ethyl esters (LOVAZA) 1 g capsule Take 2 g by mouth at bedtime. 2100   omeprazole (PRILOSEC) 40 MG capsule Take 40 mg by mouth daily. 0900   polyethylene glycol (MIRALAX / GLYCOLAX) packet Take 17 g by mouth daily as needed for mild constipation.    sevelamer carbonate (RENVELA) 800 MG tablet Take 2,400 mg by mouth 3 (three) times daily with meals. 0900, 1130, 1800   tamsulosin (FLOMAX) 0.4 MG CAPS capsule Take 0.4 mg by mouth daily at 6 PM. Give 30 minutes after a meal. Do not crush or chew   torsemide (DEMADEX) 10 MG tablet Take 50 mg by mouth daily at 6 PM.   No facility-administered encounter medications on file as of 11/11/2021.     SIGNIFICANT DIAGNOSTIC EXAMS  PREVIOUS   05-02-21: Chest x-ray: dialysis cath demonstrated on right. No acute cardiopulmonary process  NO NEW EXAMS.    LABS REVIEWED PREVIOUS;   01-28-21: hgb a1c 6.5  03-07-21: tsh 5.008  05-02-21: wbc 3.8; hgb 10.9; hct 33.7; mcv 105.6 plt 106; glucose 105; bun 17; creat 7.94; k+ 3.8; na++ 138; ca 9.1; GFR 7; d-dimer: 0.95; CRP 0.7 06-12-21: tsh 5.269  06-27-21: hgb a1c 5.6 07-30-21: hgb 11.6; hct 34.0; glucose 104;  bun 12; creat 7.30; k+ 5.2; na++ 135; ionized ca++ 0.8  08-12-21: tsh 0.822  TODAY  11-07-21: wbc 4,5 hgb 9.5; hct 29.5; mcv 103.9  plt 107; glucose 129; bun 28; creat 9.01; k+ 4.5; na++ 138; ca 8.6; GFR 6; liver normal albumin 3.1; hgb a1c 5.8; chol 60 ldl 18; trig 71; hdl 28; tsh 1.538     Review of Systems  Constitutional:  Negative for malaise/fatigue.  Respiratory:  Negative for cough and shortness of breath.   Cardiovascular:  Negative for chest pain, palpitations and leg swelling.  Gastrointestinal:  Negative for abdominal pain, constipation and heartburn.  Musculoskeletal:  Negative for back pain, joint pain and myalgias.  Skin: Negative.   Neurological:  Negative for dizziness.  Psychiatric/Behavioral:  The patient is not nervous/anxious.     Physical Exam Constitutional:      General: He is not in acute distress.    Appearance: He is well-developed. He is not diaphoretic.  Neck:     Thyroid: No thyromegaly.  Cardiovascular:     Rate and Rhythm: Normal rate and regular rhythm.     Pulses: Normal pulses.     Heart sounds: Normal heart sounds.  Pulmonary:     Effort: Pulmonary effort is normal. No respiratory distress.     Breath sounds: Normal breath sounds.  Abdominal:     General: Bowel sounds are normal. There is no distension.     Palpations: Abdomen is soft.     Tenderness: There is no abdominal tenderness.  Genitourinary:    Comments: Hx turp  Musculoskeletal:        General: Normal range of motion.     Cervical back: Neck supple.     Right lower leg: No edema.     Left lower leg: No edema.     Comments:  History of right femur ORIF    Lymphadenopathy:     Cervical: No cervical adenopathy.  Skin:    General: Skin is warm and dry.     Comments: Left arm fistula + bruit + thrill   Neurological:     Mental Status: He is alert. Mental status is at baseline.  Psychiatric:        Mood and Affect: Mood normal.      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 Thrombocytopenia  3. Hyperlipidemia associated with type 2 diabetes mellitus  Will lower basaglar to 20 units nightly  Will continue to plt Will lower lipitor to 20 mg daily   Ok Edwards NP [facility]   call [phone]

## 2021-11-12 DIAGNOSIS — D631 Anemia in chronic kidney disease: Secondary | ICD-10-CM | POA: Diagnosis not present

## 2021-11-12 DIAGNOSIS — N2581 Secondary hyperparathyroidism of renal origin: Secondary | ICD-10-CM | POA: Diagnosis not present

## 2021-11-12 DIAGNOSIS — D509 Iron deficiency anemia, unspecified: Secondary | ICD-10-CM | POA: Diagnosis not present

## 2021-11-12 DIAGNOSIS — Z992 Dependence on renal dialysis: Secondary | ICD-10-CM | POA: Diagnosis not present

## 2021-11-12 DIAGNOSIS — N186 End stage renal disease: Secondary | ICD-10-CM | POA: Diagnosis not present

## 2021-11-14 DIAGNOSIS — E1129 Type 2 diabetes mellitus with other diabetic kidney complication: Secondary | ICD-10-CM | POA: Diagnosis not present

## 2021-11-14 DIAGNOSIS — D509 Iron deficiency anemia, unspecified: Secondary | ICD-10-CM | POA: Diagnosis not present

## 2021-11-14 DIAGNOSIS — N186 End stage renal disease: Secondary | ICD-10-CM | POA: Diagnosis not present

## 2021-11-14 DIAGNOSIS — I120 Hypertensive chronic kidney disease with stage 5 chronic kidney disease or end stage renal disease: Secondary | ICD-10-CM | POA: Diagnosis not present

## 2021-11-14 DIAGNOSIS — D631 Anemia in chronic kidney disease: Secondary | ICD-10-CM | POA: Diagnosis not present

## 2021-11-14 DIAGNOSIS — Z1159 Encounter for screening for other viral diseases: Secondary | ICD-10-CM | POA: Diagnosis not present

## 2021-11-14 DIAGNOSIS — N2581 Secondary hyperparathyroidism of renal origin: Secondary | ICD-10-CM | POA: Diagnosis not present

## 2021-11-14 DIAGNOSIS — Z992 Dependence on renal dialysis: Secondary | ICD-10-CM | POA: Diagnosis not present

## 2021-11-16 ENCOUNTER — Encounter: Payer: Self-pay | Admitting: Gastroenterology

## 2021-11-16 DIAGNOSIS — D631 Anemia in chronic kidney disease: Secondary | ICD-10-CM | POA: Diagnosis not present

## 2021-11-16 DIAGNOSIS — N2581 Secondary hyperparathyroidism of renal origin: Secondary | ICD-10-CM | POA: Diagnosis not present

## 2021-11-16 DIAGNOSIS — D509 Iron deficiency anemia, unspecified: Secondary | ICD-10-CM | POA: Diagnosis not present

## 2021-11-16 DIAGNOSIS — N186 End stage renal disease: Secondary | ICD-10-CM | POA: Diagnosis not present

## 2021-11-16 DIAGNOSIS — Z992 Dependence on renal dialysis: Secondary | ICD-10-CM | POA: Diagnosis not present

## 2021-11-16 NOTE — H&P (View-Only) (Signed)
Referring Provider: Gerlene Fee, NP Primary Care Physician:  Gerlene Fee, NP Primary Gastroenterologist:  Dr. Gala Romney  Chief Complaint  Patient presents with   history of colon cancer    Last tcs 2018. Doing ok    HPI:   Zachary [name] is a 76 y.o. male presenting today at the request of Ok Edwards, NP due to history of colon cancer.  Patient has history of multiple adenomatous colon polyps and invasive colorectal adenocarcinoma in 2018 s/p total colectomy with side-to-side colorectal anastomosis in July 2018.  Pathology with margins not involved, 38 benign lymph nodes.  Last endoscopic evaluation was prior to total colectomy.    Patient has a legal guardian, Zachary [name]. Patient has been deemed incompetent and is ward of the state.  Today:  Patient states he is doing well.  He has no concerns for me today.  Denies constipation, diarrhea, BRBPR, melena, abdominal pain, unintentional weight loss.  Has a good appetite.  Denies nausea, vomiting, dysphagia, or GERD symptoms.  He is on omeprazole 40 mg daily for history of chronic GERD.  This is managed by PCP.   Past Medical History:  Diagnosis Date   Abnormal CT scan, kidney 10/06/2011   Acute pyelonephritis 10/07/2011   Anemia    normocytic   Anxiety    mental retardation   Bladder wall thickening 10/06/2011   BPH (benign prostatic hypertrophy)    Diabetes mellitus    Dialysis patient ([place])    Tuesday, Thursday and Saturday,    DVT of leg (deep venous thrombosis) ([place]) 12/25/2016   Edema     history of lower extremity edema   GERD (gastroesophageal reflux disease)    Heme positive stool    Hydronephrosis    Hyperkalemia    Hyperlipidemia    Hypernatremia    Hypertension    Hypothyroidism    Impaired speech    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[place])    MR (mental retardation)    Muscle weakness    Obstructive uropathy    Perinephric abscess 10/07/2011   Poor historian poor historian   Primary colorectal  adenocarcinoma ([place]) 04/2017   S/p total colectomy   Protein calorie malnutrition ([place])    Pyelonephritis    Renal failure (ARF), acute on chronic (HCC)    Renal insufficiency    chronic history   Sepsis ([place])    Smoking    Uremia    Urinary retention    UTI (lower urinary tract infection) 10/06/2011    Past Surgical History:  Procedure Laterality Date   A/V FISTULAGRAM N/A 08/13/2018   Procedure: A/V FISTULAGRAM - Right Upper;  Surgeon: Elam Dutch, MD;  Location: [place] [facility];  Service: Cardiovascular;  Laterality: N/A;   A/V FISTULAGRAM N/A 11/22/2018   Procedure: A/V FISTULAGRAM - Right Upper;  Surgeon: Waynetta Sandy, MD;  Location: [place] [facility];  Service: Cardiovascular;  Laterality: N/A;   AV FISTULA PLACEMENT Left 07/06/2015   Procedure:  INSERTION LEFT ARM ARTERIOVENOUS GORTEX GRAFT;  Surgeon: Angelia Mould, MD;  Location: [place];  Service: Vascular;  Laterality: Left;   AV FISTULA PLACEMENT Right 02/26/2016   Procedure: ARTERIOVENOUS (AV) FISTULA CREATION ;  Surgeon: Angelia Mould, MD;  Location: [place];  Service: Vascular;  Laterality: Right;   AV FISTULA PLACEMENT Right 11/25/2018   Procedure: INSERTION OF ARTERIOVENOUS (AV) ARTEGRAFT RIGHT UPPER ARM;  Surgeon: Waynetta Sandy, MD;  Location: [place];  Service: Vascular;  Laterality:  Right;   AV FISTULA PLACEMENT Left 05/28/2021   Procedure: LEFT ARM ARTERIOVENOUS (AV) FISTULA;  Surgeon: Rosetta Posner, MD;  [facility];  Service: Vascular;  Laterality: Left;   [place] Left 10/09/2015   Procedure: REMOVAL OF ARTERIOVENOUS GORETEX GRAFT ([place]) Evacuation of Lymphocele, Vein Patch angioplasty of brachial artery.;  Surgeon: Angelia Mould, MD;  Location: [place];  Service: Vascular;  Laterality: Left;   [place] Right 02/26/2016   Procedure: Right [place];  Surgeon: Angelia Mould, MD;  Location: [place];  Service:  Vascular;  Laterality: Right;   [place] Left 07/30/2021   Procedure: LEFT ARM SECOND STAGE BASILIC VEIN TRANSPOSITION;  Surgeon: Rosetta Posner, MD;  Location[facility];  Service: Vascular;  Laterality: Left;   CIRCUMCISION N/A 01/04/2014   Procedure: CIRCUMCISION ADULT (procedure #1);  Surgeon: Marissa Nestle, MD;  [facility];  Service: Urology;  Laterality: N/A;   COLECTOMY N/A 05/04/2017   Procedure: TOTAL COLECTOMY;  Surgeon: Aviva Signs, MD;  Location[facility];  Service: General;  Laterality: N/A;   COLONOSCOPY N/A 04/27/2017   Surgeon: Daneil Dolin, MD; annular mass in the ascending colon likely representing cancer biopsied, multiple 6-22 mm polyps removed, clean rectum.  Pathology with multiple tubular adenomas, high-grade dysplasia noted in ascending colon and splenic flexure.   CYSTOSCOPY W/ RETROGRADES Bilateral 06/29/2015   Procedure: CYSTOSCOPY, DILATION OF URETHRAL STRICTURE WITH BILATERAL RETROGRADE PYELOGRAM,SUPRAPUBIC TUBE CHANGE;  Surgeon: Festus Aloe, MD;  [facility];  Service: Urology;  Laterality: Bilateral;   CYSTOSCOPY WITH URETHRAL DILATATION N/A 12/29/2013   Procedure: CYSTOSCOPY WITH URETHRAL DILATATION;  Surgeon: Marissa Nestle, MD;  [facility];  Service: Urology;  Laterality: N/A;   ESOPHAGOGASTRODUODENOSCOPY N/A 04/27/2017   Procedure: ESOPHAGOGASTRODUODENOSCOPY (EGD);  Surgeon: Daneil Dolin, MD;  [facility];  Service: Endoscopy;  Laterality: N/A;   INSERTION OF DIALYSIS CATHETER Right 11/25/2018   Procedure: INSERTION OF DIALYSIS CATHETER RIGHT INTERNAL JUGULAR;  Surgeon: Waynetta Sandy, MD;  Location: [place];  Service: Vascular;  Laterality: Right;   IR AV DIALY SHUNT INTRO NEEDLE/INTRACATH INITIAL W/PTA/IMG RIGHT Right 09/07/2018   IR FLUORO GUIDE CV LINE RIGHT  10/16/2020   IR REMOVAL TUN CV CATH W/O FL  01/12/2019   IR THROMBECTOMY AV FISTULA W/THROMBOLYSIS/PTA INC/SHUNT/IMG RIGHT Right  04/26/2018   IR US GUIDE VASC ACCESS RIGHT  04/26/2018   IR US GUIDE VASC ACCESS RIGHT  09/07/2018   IR US GUIDE VASC ACCESS RIGHT  10/16/2020   IR US GUIDE VASC ACCESS RIGHT  10/16/2020   ORIF FEMUR FRACTURE Right 11/22/2016   Procedure: OPEN REDUCTION INTERNAL FIXATION (ORIF) DISTAL FEMUR FRACTURE;  Surgeon: Rod Can, MD;  Location: [place];  Service: Orthopedics;  Laterality: Right;   PATCH ANGIOPLASTY Right 12/10/2017   Procedure: PATCH ANGIOPLASTY;  Surgeon: Angelia Mould, MD;  Location: [place];  Service: Vascular;  Laterality: Right;   PERIPHERAL VASCULAR BALLOON ANGIOPLASTY  08/13/2018   Procedure: PERIPHERAL VASCULAR BALLOON ANGIOPLASTY;  Surgeon: Elam Dutch, MD;  Location: [place] [facility];  Service: Cardiovascular;;  right AV fistula    PERIPHERAL VASCULAR BALLOON ANGIOPLASTY  11/22/2018   Procedure: PERIPHERAL VASCULAR BALLOON ANGIOPLASTY;  Surgeon: Waynetta Sandy, MD;  Location: [place] [facility];  Service: Cardiovascular;;  rt AV fistula   PERIPHERAL VASCULAR CATHETERIZATION N/A 10/08/2015   Procedure: A/V Shuntogram;  Surgeon: Angelia Mould, MD;  Location: [place] [facility];  Service: Cardiovascular;  Laterality: N/A;   THROMBECTOMY W/ EMBOLECTOMY Right 12/10/2017   Procedure: THROMBECTOMY REVISION RIGHT ARM  ARTERIOVENOUS FISTULA;  Surgeon: Angelia Mould, MD;  Location: [facility];  Service: Vascular;  Laterality: Right;   TRANSURETHRAL RESECTION OF PROSTATE N/A 01/04/2014   Procedure: TRANSURETHRAL RESECTION OF THE PROSTATE (TURP) (procedure #2);  Surgeon: Marissa Nestle, MD;  [facility];  Service: Urology;  Laterality: N/A;    No current outpatient medications on file.   No current facility-administered medications for this visit.    Allergies as of 11/18/2021   (No Known Allergies)    Family History  Problem Relation Age of Onset   Cancer Mother    Colon cancer Neg Hx     Social History   Socioeconomic  History   Marital status: Single    Spouse name: Not on file   Number of children: Not on file   Years of education: Not on file   Highest education level: Not on file  Occupational History   Occupation: retired   Tobacco Use   Smoking status: Never   Smokeless tobacco: Never  Vaping Use   Vaping Use: Never used  Substance and Sexual Activity   Alcohol use: No   Drug use: No   Sexual activity: Not Currently  Other Topics Concern   Not on file  Social History Narrative   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   Long term resident of [facility]    Social Determinants of Health   Financial Resource Strain: Not on file  Food Insecurity: Not on file  Transportation Needs: Not on file  Physical Activity: Not on file  Stress: Not on file  Social Connections: Not on file  Intimate Partner Violence: Not on file    Review of Systems: Gen: Denies any fever, chills, cold or flulike symptoms, presyncope, syncope. CV: Denies chest pain, heart palpitations. Resp: Denies shortness of breath or cough. GI: See HPI GU : Denies urinary burning, urinary frequency, urinary hesitancy MS: Denies joint pain Derm: Denies rash Psych: Denies depression, anxiety. Heme: See HPI  Physical Exam: BP 137/68    Pulse 91    Temp (!) 96.8 F (36 C) (Temporal)    Ht 5\' 8"  (1.727 m)    Wt 214 lb 12.8 oz (97.4 kg)    BMI 32.66 kg/m  General:   Alert and oriented. Pleasant and cooperative. Well-nourished and well-developed.  In wheelchair. Head:  Normocephalic and atraumatic. Eyes:  Without icterus, sclera clear and conjunctiva pink.  Ears:  Normal auditory acuity. Lungs:  Clear to auscultation bilaterally. No wheezes, rales, or rhonchi. No distress.  Heart:  S1, S2 present without murmurs appreciated.  Abdomen:  +BS, soft, non-tender and non-distended. No HSM noted. No guarding or rebound. No masses appreciated.  Rectal:  Deferred  Msk:  Symmetrical without gross  deformities. Normal posture. Extremities:  Without edema. Neurologic:  Alert and  oriented;  grossly normal neurologically. Skin:  Intact without significant lesions or rashes. Psych:  Normal mood and affect.    Assessment:  76 year old male with history of ESRD on dialysis, chronic anemia, HTN, HLD, hypothyroidism, diabetes, multiple adenomatous colon polyps, and invasive colorectal adenocarcinoma in 2018 s/p total colectomy with side-to-side ileorectal anastomosis in July 2018 (pathology with margins not involved, 38 benign lymph nodes), presenting today at the request of Ok Edwards, NP for surveillance.  Patient has not had follow-up endoscopy since his total colectomy in 2018.  Denies any significant GI  symptoms.  Denies alarm symptoms including hematochezia, melena, or unintentional weight loss.  Discussed scheduling flexible sigmoidoscopy with patient today, but he stated he did not want to proceed with this.  As he has is a ward of the state and has a legal guardian Zachary [name] # [phone]  ext [phone]), I will reach out to her to discuss further.   After patient left office, I reviewed recent labs. I did note that his hemoglobin recently declined to 9.5 on 11/07/2021, MCV elevated at 103.9 .  Hemoglobin was 11.6 in October 2022, 12.9 in August 2022.  Appears baseline had been in the upper 10-12 range.   I also note chronic thrombocytopenia with platelets most recently 107,000.  Platelets have not dropped below 100,000.  No known liver disease.  I reviewed a CT scan from March 2019 that showed a ill-defined subtle hypoattenuation in the left hepatic lobe, unchanged from 2012 and likely benign.  No other significant liver findings.  He also had a 14 x 8 mm low-density/cystic pancreatic head lesion with recommendations to have a follow-up CT with pancreatic protocol in 2 years.  This has not been completed.  We will have this arranged in the near future.  Plan:  I will reach out to patient's  legal guardian regarding possibility of Flex sig due to history of colon cancer. Further recommendations to follow.  Recommend iron panel with ferritin, vitamin B12, and folate due to decline in hemoglobin. Will have nurse arrange as this was decided after patient left office.  CT with pancreatic protocol to follow-up on prior pancreatic lesion.    Aliene Altes, PA-C [facility] 11/18/2021

## 2021-11-16 NOTE — Progress Notes (Signed)
Referring Provider: Gerlene Fee, NP Primary Care Physician:  Gerlene Fee, NP Primary Gastroenterologist:  Dr. Gala Romney  Chief Complaint  Patient presents with   history of colon cancer    Last tcs 2018. Doing ok    HPI:   Zachary [name] is a 76 y.o. male presenting today at the request of Ok Edwards, NP due to history of colon cancer.  Patient has history of multiple adenomatous colon polyps and invasive colorectal adenocarcinoma in 2018 s/p total colectomy with side-to-side colorectal anastomosis in July 2018.  Pathology with margins not involved, 38 benign lymph nodes.  Last endoscopic evaluation was prior to total colectomy.    Patient has a legal guardian, Doretha Imus. Patient has been deemed incompetent and is ward of the state.  Today:  Patient states he is doing well.  He has no concerns for me today.  Denies constipation, diarrhea, BRBPR, melena, abdominal pain, unintentional weight loss.  Has a good appetite.  Denies nausea, vomiting, dysphagia, or GERD symptoms.  He is on omeprazole 40 mg daily for history of chronic GERD.  This is managed by PCP.   Past Medical History:  Diagnosis Date   Abnormal CT scan, kidney 10/06/2011   Acute pyelonephritis 10/07/2011   Anemia    normocytic   Anxiety    mental retardation   Bladder wall thickening 10/06/2011   BPH (benign prostatic hypertrophy)    Diabetes mellitus    Dialysis patient ([place])    Tuesday, Thursday and Saturday,    DVT of leg (deep venous thrombosis) ([place]) 12/25/2016   Edema     history of lower extremity edema   GERD (gastroesophageal reflux disease)    Heme positive stool    Hydronephrosis    Hyperkalemia    Hyperlipidemia    Hypernatremia    Hypertension    Hypothyroidism    Impaired speech    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[place])    MR (mental retardation)    Muscle weakness    Obstructive uropathy    Perinephric abscess 10/07/2011   Poor historian poor historian   Primary colorectal  adenocarcinoma ([place]) 04/2017   S/p total colectomy   Protein calorie malnutrition ([place])    Pyelonephritis    Renal failure (ARF), acute on chronic (HCC)    Renal insufficiency    chronic history   Sepsis ([place])    Smoking    Uremia    Urinary retention    UTI (lower urinary tract infection) 10/06/2011    Past Surgical History:  Procedure Laterality Date   A/V FISTULAGRAM N/A 08/13/2018   Procedure: A/V FISTULAGRAM - Right Upper;  Surgeon: Elam Dutch, MD;  Location: [place] [facility];  Service: Cardiovascular;  Laterality: N/A;   A/V FISTULAGRAM N/A 11/22/2018   Procedure: A/V FISTULAGRAM - Right Upper;  Surgeon: Waynetta Sandy, MD;  Location: [place] [facility];  Service: Cardiovascular;  Laterality: N/A;   AV FISTULA PLACEMENT Left 07/06/2015   Procedure:  INSERTION LEFT ARM ARTERIOVENOUS GORTEX GRAFT;  Surgeon: Angelia Mould, MD;  Location: [place] [facility];  Service: Vascular;  Laterality: Left;   AV FISTULA PLACEMENT Right 02/26/2016   Procedure: ARTERIOVENOUS (AV) FISTULA CREATION ;  Surgeon: Angelia Mould, MD;  Location: [place];  Service: Vascular;  Laterality: Right;   AV FISTULA PLACEMENT Right 11/25/2018   Procedure: INSERTION OF ARTERIOVENOUS (AV) ARTEGRAFT RIGHT UPPER ARM;  Surgeon: Waynetta Sandy, MD;  Location: [place];  Service: Vascular;  Laterality:  Right;   AV FISTULA PLACEMENT Left 05/28/2021   Procedure: LEFT ARM ARTERIOVENOUS (AV) FISTULA;  Surgeon: Rosetta Posner, MD;  [facility];  Service: Vascular;  Laterality: Left;   [place] Left 10/09/2015   Procedure: REMOVAL OF ARTERIOVENOUS GORETEX GRAFT ([place]) Evacuation of Lymphocele, Vein Patch angioplasty of brachial artery.;  Surgeon: Angelia Mould, MD;  Location: [place];  Service: Vascular;  Laterality: Left;   [place] Right 02/26/2016   Procedure: Right [place];  Surgeon: Angelia Mould, MD;  Location: [place];  Service:  Vascular;  Laterality: Right;   [place] Left 07/30/2021   Procedure: LEFT ARM SECOND STAGE BASILIC VEIN TRANSPOSITION;  Surgeon: Rosetta Posner, MD;  Location[facility];  Service: Vascular;  Laterality: Left;   CIRCUMCISION N/A 01/04/2014   Procedure: CIRCUMCISION ADULT (procedure #1);  Surgeon: Marissa Nestle, MD;  [facility];  Service: Urology;  Laterality: N/A;   COLECTOMY N/A 05/04/2017   Procedure: TOTAL COLECTOMY;  Surgeon: Aviva Signs, MD;  [facility];  Service: General;  Laterality: N/A;   COLONOSCOPY N/A 04/27/2017   Surgeon: Daneil Dolin, MD; annular mass in the ascending colon likely representing cancer biopsied, multiple 6-22 mm polyps removed, clean rectum.  Pathology with multiple tubular adenomas, high-grade dysplasia noted in ascending colon and splenic flexure.   CYSTOSCOPY W/ RETROGRADES Bilateral 06/29/2015   Procedure: CYSTOSCOPY, DILATION OF URETHRAL STRICTURE WITH BILATERAL RETROGRADE PYELOGRAM,SUPRAPUBIC TUBE CHANGE;  Surgeon: Festus Aloe, MD;  Location[facility];  Service: Urology;  Laterality: Bilateral;   CYSTOSCOPY WITH URETHRAL DILATATION N/A 12/29/2013   Procedure: CYSTOSCOPY WITH URETHRAL DILATATION;  Surgeon: Marissa Nestle, MD;  [facility];  Service: Urology;  Laterality: N/A;   ESOPHAGOGASTRODUODENOSCOPY N/A 04/27/2017   Procedure: ESOPHAGOGASTRODUODENOSCOPY (EGD);  Surgeon: Daneil Dolin, MD;  [facility];  Service: Endoscopy;  Laterality: N/A;   INSERTION OF DIALYSIS CATHETER Right 11/25/2018   Procedure: INSERTION OF DIALYSIS CATHETER RIGHT INTERNAL JUGULAR;  Surgeon: Waynetta Sandy, MD;  Location: [place];  Service: Vascular;  Laterality: Right;   IR AV DIALY SHUNT INTRO NEEDLE/INTRACATH INITIAL W/PTA/IMG RIGHT Right 09/07/2018   IR FLUORO GUIDE CV LINE RIGHT  10/16/2020   IR REMOVAL TUN CV CATH W/O FL  01/12/2019   IR THROMBECTOMY AV FISTULA W/THROMBOLYSIS/PTA INC/SHUNT/IMG RIGHT Right  04/26/2018   IR US GUIDE VASC ACCESS RIGHT  04/26/2018   IR US GUIDE VASC ACCESS RIGHT  09/07/2018   IR US GUIDE VASC ACCESS RIGHT  10/16/2020   IR US GUIDE VASC ACCESS RIGHT  10/16/2020   ORIF FEMUR FRACTURE Right 11/22/2016   Procedure: OPEN REDUCTION INTERNAL FIXATION (ORIF) DISTAL FEMUR FRACTURE;  Surgeon: Rod Can, MD;  Location: [place];  Service: Orthopedics;  Laterality: Right;   PATCH ANGIOPLASTY Right 12/10/2017   Procedure: PATCH ANGIOPLASTY;  Surgeon: Angelia Mould, MD;  Location: [place];  Service: Vascular;  Laterality: Right;   PERIPHERAL VASCULAR BALLOON ANGIOPLASTY  08/13/2018   Procedure: PERIPHERAL VASCULAR BALLOON ANGIOPLASTY;  Surgeon: Elam Dutch, MD;  Location: [place] [facility];  Service: Cardiovascular;;  right AV fistula    PERIPHERAL VASCULAR BALLOON ANGIOPLASTY  11/22/2018   Procedure: PERIPHERAL VASCULAR BALLOON ANGIOPLASTY;  Surgeon: Waynetta Sandy, MD;  Location: [place] [facility];  Service: Cardiovascular;;  rt AV fistula   PERIPHERAL VASCULAR CATHETERIZATION N/A 10/08/2015   Procedure: A/V Shuntogram;  Surgeon: Angelia Mould, MD;  Location: [place] [facility];  Service: Cardiovascular;  Laterality: N/A;   THROMBECTOMY W/ EMBOLECTOMY Right 12/10/2017   Procedure: THROMBECTOMY REVISION RIGHT ARM  ARTERIOVENOUS FISTULA;  Surgeon: Angelia Mould, MD;  [facility];  Service: Vascular;  Laterality: Right;   TRANSURETHRAL RESECTION OF PROSTATE N/A 01/04/2014   Procedure: TRANSURETHRAL RESECTION OF THE PROSTATE (TURP) (procedure #2);  Surgeon: Marissa Nestle, MD;  [facility];  Service: Urology;  Laterality: N/A;    No current outpatient medications on file.   No current facility-administered medications for this visit.    Allergies as of 11/18/2021   (No Known Allergies)    Family History  Problem Relation Age of Onset   Cancer Mother    Colon cancer Neg Hx     Social History   Socioeconomic  History   Marital status: Single    Spouse name: Not on file   Number of children: Not on file   Years of education: Not on file   Highest education level: Not on file  Occupational History   Occupation: retired   Tobacco Use   Smoking status: Never   Smokeless tobacco: Never  Vaping Use   Vaping Use: Never used  Substance and Sexual Activity   Alcohol use: No   Drug use: No   Sexual activity: Not Currently  Other Topics Concern   Not on file  Social History Narrative   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   [facility] resident of [facility]    Social Determinants of Health   Financial Resource Strain: Not on file  Food Insecurity: Not on file  Transportation Needs: Not on file  Physical Activity: Not on file  Stress: Not on file  Social Connections: Not on file  Intimate Partner Violence: Not on file    Review of Systems: Gen: Denies any fever, chills, cold or flulike symptoms, presyncope, syncope. CV: Denies chest pain, heart palpitations. Resp: Denies shortness of breath or cough. GI: See HPI GU : Denies urinary burning, urinary frequency, urinary hesitancy MS: Denies joint pain Derm: Denies rash Psych: Denies depression, anxiety. Heme: See HPI  Physical Exam: BP 137/68    Pulse 91    Temp (!) 96.8 F (36 C) (Temporal)    Ht 5\' 8"  (1.727 m)    Wt 214 lb 12.8 oz (97.4 kg)    BMI 32.66 kg/m  General:   Alert and oriented. Pleasant and cooperative. Well-nourished and well-developed.  In wheelchair. Head:  Normocephalic and atraumatic. Eyes:  Without icterus, sclera clear and conjunctiva pink.  Ears:  Normal auditory acuity. Lungs:  Clear to auscultation bilaterally. No wheezes, rales, or rhonchi. No distress.  Heart:  S1, S2 present without murmurs appreciated.  Abdomen:  +BS, soft, non-tender and non-distended. No HSM noted. No guarding or rebound. No masses appreciated.  Rectal:  Deferred  Msk:  Symmetrical without gross  deformities. Normal posture. Extremities:  Without edema. Neurologic:  Alert and  oriented;  grossly normal neurologically. Skin:  Intact without significant lesions or rashes. Psych:  Normal mood and affect.    Assessment:  76 year old male with history of ESRD on dialysis, chronic anemia, HTN, HLD, hypothyroidism, diabetes, multiple adenomatous colon polyps, and invasive colorectal adenocarcinoma in 2018 s/p total colectomy with side-to-side ileorectal anastomosis in July 2018 (pathology with margins not involved, 38 benign lymph nodes), presenting today at the request of Ok Edwards, NP for surveillance.  Patient has not had follow-up endoscopy since his total colectomy in 2018.  Denies any significant GI  symptoms.  Denies alarm symptoms including hematochezia, melena, or unintentional weight loss.  Discussed scheduling flexible sigmoidoscopy with patient today, but he stated he did not want to proceed with this.  As he has is a ward of the state and has a legal guardian Doretha Imus # [phone]  ext [phone]), I will reach out to her to discuss further.   After patient left office, I reviewed recent labs. I did note that his hemoglobin recently declined to 9.5 on 11/07/2021, MCV elevated at 103.9 .  Hemoglobin was 11.6 in October 2022, 12.9 in August 2022.  Appears baseline had been in the upper 10-12 range.   I also note chronic thrombocytopenia with platelets most recently 107,000.  Platelets have not dropped below 100,000.  No known liver disease.  I reviewed a CT scan from March 2019 that showed a ill-defined subtle hypoattenuation in the left hepatic lobe, unchanged from 2012 and likely benign.  No other significant liver findings.  He also had a 14 x 8 mm low-density/cystic pancreatic head lesion with recommendations to have a follow-up CT with pancreatic protocol in 2 years.  This has not been completed.  We will have this arranged in the near future.  Plan:  I will reach out to patient's  legal guardian regarding possibility of Flex sig due to history of colon cancer. Further recommendations to follow.  Recommend iron panel with ferritin, vitamin B12, and folate due to decline in hemoglobin. Will have nurse arrange as this was decided after patient left office.  CT with pancreatic protocol to follow-up on prior pancreatic lesion.    Aliene Altes, PA-C [facility] 11/18/2021

## 2021-11-18 ENCOUNTER — Telehealth: Payer: Self-pay | Admitting: Gastroenterology

## 2021-11-18 ENCOUNTER — Ambulatory Visit (INDEPENDENT_AMBULATORY_CARE_PROVIDER_SITE_OTHER): Payer: Medicare Other | Admitting: Gastroenterology

## 2021-11-18 ENCOUNTER — Other Ambulatory Visit: Payer: Self-pay | Admitting: *Deleted

## 2021-11-18 ENCOUNTER — Encounter: Payer: Self-pay | Admitting: Gastroenterology

## 2021-11-18 VITALS — BP 137/68 | HR 91 | Temp 96.8°F | Ht 68.0 in | Wt 214.8 lb

## 2021-11-18 DIAGNOSIS — D696 Thrombocytopenia, unspecified: Secondary | ICD-10-CM | POA: Diagnosis not present

## 2021-11-18 DIAGNOSIS — D649 Anemia, unspecified: Secondary | ICD-10-CM

## 2021-11-18 DIAGNOSIS — K869 Disease of pancreas, unspecified: Secondary | ICD-10-CM | POA: Diagnosis not present

## 2021-11-18 DIAGNOSIS — Z85038 Personal history of other malignant neoplasm of large intestine: Secondary | ICD-10-CM | POA: Diagnosis not present

## 2021-11-18 NOTE — Telephone Encounter (Signed)
After patient left the office, I spoke with patient's legal guardian, Doretha Imus, regarding recommendations for flexible sigmoidoscopy and patient's preference not to proceed with the procedure.  Ms. Norville Haggard has recommended we proceed with flexible sigmoidoscopy; she is agreeable for Dr. Abbey Chatters to perform the procedure in Dr. Roseanne Kaufman absence.  I also reviewed recent labs and noted a decline in his hemoglobin to 9.5 on 11/07/2021.  He denies any overt GI bleeding to me at his office visit.  Recommend checking iron panel with ferritin, folate, and B12.  I also reviewed prior CT completed in March 2019 that revealed a 14 mm pancreatic head lesion with recommendations for repeat CT with pancreatic protocol in 2 years which has not been arranged. We will need to get this arranged in the near future.   [place]:  Please update patient/nursing facility on the above recommendations.  Please arrange iron panel with ferritin, vitamin B12, and folate.  DX: Anemia.  [facility] clinical pool: Please arrange flexible sigmoidoscopy with propofol with Dr. Abbey Chatters. ASA III No AM diabetes medications.  Please note, he is a hemodialysis patient.   Please also arrange CT with pancreatic protocol. Dx: Pancreatic lesion on CT in March 2019.

## 2021-11-18 NOTE — Patient Instructions (Signed)
We will hold off on scheduling you for a flexible sigmoidoscopy for now. We will reach out to you after I have discussed with your legal guardian.   It was a pleasure to meet you today!  Aliene Altes, PA-C [facility]

## 2021-11-18 NOTE — Telephone Encounter (Signed)
Spoke to [employer] nurse at [facility], informed her of results and recommendations. She voiced understanding. Informed her that pt needs labs drawn. Labs entered into [employer]

## 2021-11-18 NOTE — Telephone Encounter (Addendum)
Called facility and spoke with [facility]. Flex sig scheduled for 2/6 at 10:30am. Aware will fax prep instructions to her. She also stated can fax CT appt details to her at [phone].   CT scheduled for 3/6 at 3:00pm, arrival 2:45pm, npo 4 hrs prior

## 2021-11-18 NOTE — Addendum Note (Signed)
Addended by: Cheron Every on: 11/18/2021 02:09 PM   Modules accepted: Orders

## 2021-11-19 DIAGNOSIS — D509 Iron deficiency anemia, unspecified: Secondary | ICD-10-CM | POA: Diagnosis not present

## 2021-11-19 DIAGNOSIS — D631 Anemia in chronic kidney disease: Secondary | ICD-10-CM | POA: Diagnosis not present

## 2021-11-19 DIAGNOSIS — Z992 Dependence on renal dialysis: Secondary | ICD-10-CM | POA: Diagnosis not present

## 2021-11-19 DIAGNOSIS — N186 End stage renal disease: Secondary | ICD-10-CM | POA: Diagnosis not present

## 2021-11-19 DIAGNOSIS — N2581 Secondary hyperparathyroidism of renal origin: Secondary | ICD-10-CM | POA: Diagnosis not present

## 2021-11-21 DIAGNOSIS — N2581 Secondary hyperparathyroidism of renal origin: Secondary | ICD-10-CM | POA: Diagnosis not present

## 2021-11-21 DIAGNOSIS — E1129 Type 2 diabetes mellitus with other diabetic kidney complication: Secondary | ICD-10-CM | POA: Diagnosis not present

## 2021-11-21 DIAGNOSIS — D631 Anemia in chronic kidney disease: Secondary | ICD-10-CM | POA: Diagnosis not present

## 2021-11-21 DIAGNOSIS — Z992 Dependence on renal dialysis: Secondary | ICD-10-CM | POA: Diagnosis not present

## 2021-11-21 DIAGNOSIS — N186 End stage renal disease: Secondary | ICD-10-CM | POA: Diagnosis not present

## 2021-11-21 DIAGNOSIS — I120 Hypertensive chronic kidney disease with stage 5 chronic kidney disease or end stage renal disease: Secondary | ICD-10-CM | POA: Diagnosis not present

## 2021-11-21 DIAGNOSIS — D509 Iron deficiency anemia, unspecified: Secondary | ICD-10-CM | POA: Diagnosis not present

## 2021-11-21 DIAGNOSIS — Z1159 Encounter for screening for other viral diseases: Secondary | ICD-10-CM | POA: Diagnosis not present

## 2021-11-22 ENCOUNTER — Other Ambulatory Visit (HOSPITAL_COMMUNITY)
Admission: RE | Admit: 2021-11-22 | Discharge: 2021-11-22 | Disposition: A | Discharge status: Home / Self Care | Payer: Medicare Other | Source: Skilled Nursing Facility | Attending: Adult Health | Admitting: Adult Health

## 2021-11-22 DIAGNOSIS — D500 Iron deficiency anemia secondary to blood loss (chronic): Secondary | ICD-10-CM | POA: Diagnosis not present

## 2021-11-22 LAB — FOLATE: Folate: 6.6 ng/mL (ref >5.9)

## 2021-11-22 LAB — HEMOGLOBIN AND HEMATOCRIT, BLOOD
HCT: 28.4 % — ABNORMAL LOW (ref 39.0–52.0)
Hemoglobin: 9.3 g/dL — ABNORMAL LOW (ref 13.0–17.0)

## 2021-11-22 LAB — IRON AND TIBC
Iron: 80 ug/dL (ref 45–182)
Saturation Ratios: 39 % (ref 17.9–39.5)
TIBC: 203 ug/dL — ABNORMAL LOW (ref 250–450)
UIBC: 123 ug/dL

## 2021-11-22 LAB — FERRITIN: Ferritin: 474 ng/mL — ABNORMAL HIGH (ref 24–336)

## 2021-11-22 LAB — VITAMIN B12: Vitamin B-12: 272 pg/mL (ref 180–914)

## 2021-11-22 NOTE — Patient Instructions (Signed)
° °   Zachary [name]  11/22/2021     @PREFPERIOPPHARMACY @   Your procedure is scheduled on  12/02/2021.   Report to [facility] at  [place] A.[facility].   Call this number if you have problems the morning of surgery:  [phone]   Remember:  Follow the diet and prep instructions given to you by the office.      Take 12.5 units of glargine the night before your procedure.      DO NOT take any medications for diabetes the morning of you procedure.      Take these medicines the morning of surgery with A SIP OF WATER                                                levothyroxine, prilosec, flomax.       Do not wear jewelry, make-up or nail polish.  Do not wear lotions, powders, or perfumes, or deodorant.  Do not shave 48 hours prior to surgery.  Men may shave face and neck.  Do not bring valuables to the hospital.  [facility] is not responsible for any belongings or valuables.  Contacts, dentures or bridgework may not be worn into surgery.  Leave your suitcase in the car.  After surgery it may be brought to your room.  For patients admitted to the hospital, discharge time will be determined by your treatment team.  Patients discharged the day of surgery will not be allowed to drive home.    Special instructions:   DO NOT smoke tobacco or vape for 24 hours before your procedure.  Please read over the following fact sheets that you were given. Care and Recovery After Surgery

## 2021-11-23 DIAGNOSIS — D631 Anemia in chronic kidney disease: Secondary | ICD-10-CM | POA: Diagnosis not present

## 2021-11-23 DIAGNOSIS — D509 Iron deficiency anemia, unspecified: Secondary | ICD-10-CM | POA: Diagnosis not present

## 2021-11-23 DIAGNOSIS — N2581 Secondary hyperparathyroidism of renal origin: Secondary | ICD-10-CM | POA: Diagnosis not present

## 2021-11-23 DIAGNOSIS — N186 End stage renal disease: Secondary | ICD-10-CM | POA: Diagnosis not present

## 2021-11-23 DIAGNOSIS — Z992 Dependence on renal dialysis: Secondary | ICD-10-CM | POA: Diagnosis not present

## 2021-11-26 ENCOUNTER — Non-Acute Institutional Stay (SKILLED_NURSING_FACILITY): Payer: Medicare Other | Admitting: Adult Health

## 2021-11-26 ENCOUNTER — Encounter: Payer: Self-pay | Admitting: Adult Health

## 2021-11-26 DIAGNOSIS — D509 Iron deficiency anemia, unspecified: Secondary | ICD-10-CM | POA: Diagnosis not present

## 2021-11-26 DIAGNOSIS — E538 Deficiency of other specified B group vitamins: Secondary | ICD-10-CM | POA: Diagnosis not present

## 2021-11-26 DIAGNOSIS — Z992 Dependence on renal dialysis: Secondary | ICD-10-CM

## 2021-11-26 DIAGNOSIS — N186 End stage renal disease: Secondary | ICD-10-CM | POA: Diagnosis not present

## 2021-11-26 DIAGNOSIS — D631 Anemia in chronic kidney disease: Secondary | ICD-10-CM | POA: Diagnosis not present

## 2021-11-26 DIAGNOSIS — N2581 Secondary hyperparathyroidism of renal origin: Secondary | ICD-10-CM | POA: Diagnosis not present

## 2021-11-26 NOTE — Progress Notes (Signed)
Location:  [facility] Room Number: 112 Place of Service:  SNF (31)   CODE STATUS: full   No Known Allergies  Chief Complaint  Patient presents with   Acute Visit    Lab follow up     HPI:  His vitamin B 12 level is 272; which is low normal. He will require supplementation. There are no reports of pain; no excessive lethargy.   Past Medical History:  Diagnosis Date   Abnormal CT scan, kidney 10/06/2011   Acute pyelonephritis 10/07/2011   Anemia    normocytic   Anxiety    mental retardation   Bladder wall thickening 10/06/2011   BPH (benign prostatic hypertrophy)    Diabetes mellitus    Dialysis patient ([place])    Tuesday, Thursday and Saturday,    DVT of leg (deep venous thrombosis) ([place]) 12/25/2016   Edema     history of lower extremity edema   GERD (gastroesophageal reflux disease)    Heme positive stool    Hydronephrosis    Hyperkalemia    Hyperlipidemia    Hypernatremia    Hypertension    Hypothyroidism    Impaired speech    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[place])    MR (mental retardation)    Muscle weakness    Obstructive uropathy    Perinephric abscess 10/07/2011   Poor historian poor historian   Primary colorectal adenocarcinoma ([place]) 04/2017   S/p total colectomy   Protein calorie malnutrition ([place])    Pyelonephritis    Renal failure (ARF), acute on chronic (HCC)    Renal insufficiency    chronic history   Sepsis ([place])    Smoking    Uremia    Urinary retention    UTI (lower urinary tract infection) 10/06/2011    Past Surgical History:  Procedure Laterality Date   A/V FISTULAGRAM N/A 08/13/2018   Procedure: A/V FISTULAGRAM - Right Upper;  Surgeon: Elam Dutch, MD;  Location: [place] [facility];  Service: Cardiovascular;  Laterality: N/A;   A/V FISTULAGRAM N/A 11/22/2018   Procedure: A/V FISTULAGRAM - Right Upper;  Surgeon: Waynetta Sandy, MD;  Location: [place] [facility];  Service: Cardiovascular;   Laterality: N/A;   AV FISTULA PLACEMENT Left 07/06/2015   Procedure:  INSERTION LEFT ARM ARTERIOVENOUS GORTEX GRAFT;  Surgeon: Angelia Mould, MD;  Location: [facility];  Service: Vascular;  Laterality: Left;   AV FISTULA PLACEMENT Right 02/26/2016   Procedure: ARTERIOVENOUS (AV) FISTULA CREATION ;  Surgeon: Angelia Mould, MD;  Location: [facility];  Service: Vascular;  Laterality: Right;   AV FISTULA PLACEMENT Right 11/25/2018   Procedure: INSERTION OF ARTERIOVENOUS (AV) ARTEGRAFT RIGHT UPPER ARM;  Surgeon: Waynetta Sandy, MD;  Location: [facility];  Service: Vascular;  Laterality: Right;   AV FISTULA PLACEMENT Left 05/28/2021   Procedure: LEFT ARM ARTERIOVENOUS (AV) FISTULA;  Surgeon: Rosetta Posner, MD;  Location: [facility];  Service: Vascular;  Laterality: Left;   [place] REMOVAL Left 10/09/2015   Procedure: REMOVAL OF ARTERIOVENOUS GORETEX GRAFT ([place]) Evacuation of Lymphocele, Vein Patch angioplasty of brachial artery.;  Surgeon: Angelia Mould, MD;  Location: [facility];  Service: Vascular;  Laterality: Left;   [place] Right 02/26/2016   Procedure: Right [place];  Surgeon: Angelia Mould, MD;  Location: [facility];  Service: Vascular;  Laterality: Right;   [place] Left 07/30/2021   Procedure: LEFT ARM SECOND STAGE BASILIC VEIN TRANSPOSITION;  Surgeon:  Rosetta Posner, MD;  Location: [facility];  Service: Vascular;  Laterality: Left;   CIRCUMCISION N/A 01/04/2014   Procedure: CIRCUMCISION ADULT (procedure #1);  Surgeon: Marissa Nestle, MD;  Location[facility];  Service: Urology;  Laterality: N/A;   COLECTOMY N/A 05/04/2017   Procedure: TOTAL COLECTOMY;  Surgeon: Aviva Signs, MD;  Location: [facility];  Service: General;  Laterality: N/A;   COLONOSCOPY N/A 04/27/2017   Surgeon: Daneil Dolin, MD; annular mass in the ascending colon likely representing cancer biopsied, multiple 6-22 mm polyps removed, clean rectum.  Pathology with  multiple tubular adenomas, high-grade dysplasia noted in ascending colon and splenic flexure.   CYSTOSCOPY W/ RETROGRADES Bilateral 06/29/2015   Procedure: CYSTOSCOPY, DILATION OF URETHRAL STRICTURE WITH BILATERAL RETROGRADE PYELOGRAM,SUPRAPUBIC TUBE CHANGE;  Surgeon: Festus Aloe, MD;  Location: [facility];  Service: Urology;  Laterality: Bilateral;   CYSTOSCOPY WITH URETHRAL DILATATION N/A 12/29/2013   Procedure: CYSTOSCOPY WITH URETHRAL DILATATION;  Surgeon: Marissa Nestle, MD;  [facility];  Service: Urology;  Laterality: N/A;   ESOPHAGOGASTRODUODENOSCOPY N/A 04/27/2017   Procedure: ESOPHAGOGASTRODUODENOSCOPY (EGD);  Surgeon: Daneil Dolin, MD;  [facility];  Service: Endoscopy;  Laterality: N/A;   INSERTION OF DIALYSIS CATHETER Right 11/25/2018   Procedure: INSERTION OF DIALYSIS CATHETER RIGHT INTERNAL JUGULAR;  Surgeon: Waynetta Sandy, MD;  Location: [facility];  Service: Vascular;  Laterality: Right;   IR AV DIALY SHUNT INTRO NEEDLE/INTRACATH INITIAL W/PTA/IMG RIGHT Right 09/07/2018   IR FLUORO GUIDE CV LINE RIGHT  10/16/2020   IR REMOVAL TUN CV CATH W/O FL  01/12/2019   IR THROMBECTOMY AV FISTULA W/THROMBOLYSIS/PTA INC/SHUNT/IMG RIGHT Right 04/26/2018   IR US GUIDE VASC ACCESS RIGHT  04/26/2018   IR US GUIDE VASC ACCESS RIGHT  09/07/2018   IR US GUIDE VASC ACCESS RIGHT  10/16/2020   IR US GUIDE VASC ACCESS RIGHT  10/16/2020   ORIF FEMUR FRACTURE Right 11/22/2016   Procedure: OPEN REDUCTION INTERNAL FIXATION (ORIF) DISTAL FEMUR FRACTURE;  Surgeon: Rod Can, MD;  Location: [facility];  Service: Orthopedics;  Laterality: Right;   PATCH ANGIOPLASTY Right 12/10/2017   Procedure: PATCH ANGIOPLASTY;  Surgeon: Angelia Mould, MD;  Location: [facility];  Service: Vascular;  Laterality: Right;   PERIPHERAL VASCULAR BALLOON ANGIOPLASTY  08/13/2018   Procedure: PERIPHERAL VASCULAR BALLOON ANGIOPLASTY;  Surgeon: Elam Dutch, MD;  Location: [facility];   Service: Cardiovascular;;  right AV fistula    PERIPHERAL VASCULAR BALLOON ANGIOPLASTY  11/22/2018   Procedure: PERIPHERAL VASCULAR BALLOON ANGIOPLASTY;  Surgeon: Waynetta Sandy, MD;  Location: [facility];  Service: Cardiovascular;;  rt AV fistula   PERIPHERAL VASCULAR CATHETERIZATION N/A 10/08/2015   Procedure: A/V Shuntogram;  Surgeon: Angelia Mould, MD;  Location: [facility];  Service: Cardiovascular;  Laterality: N/A;   THROMBECTOMY W/ EMBOLECTOMY Right 12/10/2017   Procedure: THROMBECTOMY REVISION RIGHT ARM  ARTERIOVENOUS FISTULA;  Surgeon: Angelia Mould, MD;  Location: [facility];  Service: Vascular;  Laterality: Right;   TRANSURETHRAL RESECTION OF PROSTATE N/A 01/04/2014   Procedure: TRANSURETHRAL RESECTION OF THE PROSTATE (TURP) (procedure #2);  Surgeon: Marissa Nestle, MD;  [facility];  Service: Urology;  Laterality: N/A;    Social History   Socioeconomic History   Marital status: Single    Spouse name: Not on file   Number of children: Not on file   Years of education: Not on file   Highest education level: Not on file  Occupational History   Occupation: retired   Tobacco Use   Smoking status: Never   Smokeless tobacco: Never  Vaping Use   Vaping Use: Never used  Substance and Sexual Activity   Alcohol use: No   Drug use: No   Sexual activity: Not Currently  Other Topics Concern   Not on file  Social History Narrative   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   [facility] resident of [facility]    Social Determinants of Health   Financial Resource Strain: Not on file  Food Insecurity: Not on file  Transportation Needs: Not on file  Physical Activity: Not on file  Stress: Not on file  Social Connections: Not on file  Intimate Partner Violence: Not on file   Family History  Problem Relation Age of Onset   Cancer Mother    Colon cancer Neg Hx       VITAL SIGNS BP 120/67    Pulse  85    Temp 98 F (36.7 C)    Resp 18    Ht 5\' 8"  (1.727 m)    Wt 215 lb (97.5 kg)    BMI 32.69 kg/m   Outpatient Encounter Medications as of 11/26/2021  Medication Sig   aspirin EC 81 MG tablet Take 81 mg by mouth daily. 0900   atorvastatin (LIPITOR) 20 MG tablet Take 20 mg by mouth at bedtime. 2000   Insulin Glargine (BASAGLAR KWIKPEN) 100 UNIT/ML SOPN Inject 20 Units into the skin at bedtime. 2100 DO NOT HOLD this insulin without provider notification   levothyroxine (SYNTHROID) 125 MCG tablet Take 125 mcg by mouth at bedtime. 2100   midodrine (PROAMATINE) 10 MG tablet Take 10 mg by mouth See admin instructions. Send med with resident on  Tues/Thurs/Sat to dialysis. do not give [facility], give to resident to take with him. send with 5mg  to equal 15mg . 6:00   midodrine (PROAMATINE) 5 MG tablet Take 5 mg by mouth See admin instructions. Send with resident on dialysis days.  Tues/Thurs/Sat  Take along with 10 mg to equal 15 mg 6:00   NON FORMULARY Diet: NAS, Cons CHO   omega-3 acid ethyl esters (LOVAZA) 1 g capsule Take 2 g by mouth at bedtime. 2100   omeprazole (PRILOSEC) 40 MG capsule Take 40 mg by mouth daily. 0900   polyethylene glycol (MIRALAX / GLYCOLAX) packet Take 17 g by mouth daily as needed for mild constipation.    sevelamer carbonate (RENVELA) 800 MG tablet Take 2,400 mg by mouth 3 (three) times daily with meals. 0900, 1200, 1700   tamsulosin (FLOMAX) 0.4 MG CAPS capsule Take 0.4 mg by mouth daily at 6 PM. Give 30 minutes after a meal. Do not crush or chew   torsemide (DEMADEX) 10 MG tablet Take 50 mg by mouth daily. 1700   No facility-administered encounter medications on file as of 11/26/2021.     SIGNIFICANT DIAGNOSTIC EXAMS   PREVIOUS   05-02-21: Chest x-ray: dialysis cath demonstrated on right. No acute cardiopulmonary process  NO NEW EXAMS.    LABS REVIEWED PREVIOUS;   01-28-21: hgb a1c 6.5  03-07-21: tsh 5.008  05-02-21: wbc 3.8; hgb 10.9; hct 33.7; mcv 105.6 plt  106; glucose 105; bun 17; creat 7.94; k+ 3.8; na++ 138; ca 9.1; GFR 7; d-dimer: 0.95; CRP 0.7 06-12-21: tsh 5.269  06-27-21: hgb a1c 5.6 07-30-21: hgb 11.6; hct 34.0; glucose 104; bun 12; creat 7.30; k+ 5.2; na++ 135; ionized ca++ 0.8  08-12-21:  tsh 0.822  TODAY  11-07-21: wbc 4,5 hgb 9.5; hct 29.5; mcv 103.9 plt 107; glucose 129; bun 28; creat 9.01; k+ 4.5; na++ 138; ca 8.6; GFR 6; liver normal albumin 3.1; hgb a1c 5.8; chol 60 ldl 18; trig 71; hdl 28; tsh 1.538  11-22-21: hgb 9,3; hct 28.4; iron 80; tibc 203; ferritin 474; vitamin B 12: 272 folate 6.6     Review of Systems  Constitutional:  Negative for malaise/fatigue.  Respiratory:  Negative for cough and shortness of breath.   Cardiovascular:  Negative for chest pain, palpitations and leg swelling.  Gastrointestinal:  Negative for abdominal pain, constipation and heartburn.  Musculoskeletal:  Negative for back pain, joint pain and myalgias.  Skin: Negative.   Neurological:  Negative for dizziness.  Psychiatric/Behavioral:  The patient is not nervous/anxious.     Physical Exam Constitutional:      General: He is not in acute distress.    Appearance: He is well-developed. He is not diaphoretic.  Neck:     Thyroid: No thyromegaly.  Cardiovascular:     Rate and Rhythm: Normal rate and regular rhythm.     Pulses: Normal pulses.     Heart sounds: Normal heart sounds.  Pulmonary:     Effort: Pulmonary effort is normal. No respiratory distress.     Breath sounds: Normal breath sounds.  Abdominal:     General: Bowel sounds are normal. There is no distension.     Palpations: Abdomen is soft.     Tenderness: There is no abdominal tenderness.  Genitourinary:    Comments: Hx turp  Musculoskeletal:        General: Normal range of motion.     Cervical back: Neck supple.     Right lower leg: No edema.     Left lower leg: No edema.     Comments: History of right femur ORIF     Lymphadenopathy:     Cervical: No cervical adenopathy.   Skin:    General: Skin is warm and dry.     Comments: Left arm fistula + bruit + thrill    Neurological:     Mental Status: He is alert. Mental status is at baseline.  Psychiatric:        Mood and Affect: Mood normal.      ASSESSMENT/ PLAN:  TODAY  Vitamin B 12 deficiency: is worse will begin 1,000 mcg daily will monitor    Ok Edwards NP [facility]  call [phone]

## 2021-11-27 ENCOUNTER — Encounter (HOSPITAL_COMMUNITY)
Admit: 2021-11-27 | Discharge: 2021-11-27 | Disposition: A | Discharge status: Home / Self Care | Payer: Medicare Other | Attending: Internal Medicine | Admitting: Internal Medicine

## 2021-11-27 ENCOUNTER — Telehealth: Payer: Self-pay

## 2021-11-27 NOTE — Telephone Encounter (Signed)
Ramiro Harvest, PA from [facility] called to schedule pt's [facility] removal with Dr. Donnetta Hutching in [place].

## 2021-11-28 ENCOUNTER — Other Ambulatory Visit: Payer: Self-pay

## 2021-11-28 DIAGNOSIS — N2581 Secondary hyperparathyroidism of renal origin: Secondary | ICD-10-CM | POA: Diagnosis not present

## 2021-11-28 DIAGNOSIS — N186 End stage renal disease: Secondary | ICD-10-CM | POA: Diagnosis not present

## 2021-11-28 DIAGNOSIS — Z992 Dependence on renal dialysis: Secondary | ICD-10-CM | POA: Diagnosis not present

## 2021-11-28 DIAGNOSIS — D509 Iron deficiency anemia, unspecified: Secondary | ICD-10-CM | POA: Diagnosis not present

## 2021-11-28 DIAGNOSIS — E538 Deficiency of other specified B group vitamins: Secondary | ICD-10-CM | POA: Insufficient documentation

## 2021-11-28 DIAGNOSIS — D631 Anemia in chronic kidney disease: Secondary | ICD-10-CM | POA: Diagnosis not present

## 2021-11-28 DIAGNOSIS — Z1159 Encounter for screening for other viral diseases: Secondary | ICD-10-CM | POA: Diagnosis not present

## 2021-11-28 DIAGNOSIS — E1129 Type 2 diabetes mellitus with other diabetic kidney complication: Secondary | ICD-10-CM | POA: Diagnosis not present

## 2021-11-28 DIAGNOSIS — I120 Hypertensive chronic kidney disease with stage 5 chronic kidney disease or end stage renal disease: Secondary | ICD-10-CM | POA: Diagnosis not present

## 2021-11-28 NOTE — Pre-Procedure Instructions (Signed)
Pre-op phone call amse to [facility]. I spoke with Inez Catalina and faxed pre-op instructions to her. She states POA, legal guardian, Doretha Imus is planning to be here to sign consent. Called Doretha Imus and left her a message to call us back to veify that she would be here to sign consent.

## 2021-11-29 ENCOUNTER — Other Ambulatory Visit: Payer: Self-pay | Admitting: *Deleted

## 2021-11-29 ENCOUNTER — Other Ambulatory Visit (HOSPITAL_COMMUNITY): Payer: Medicare Other

## 2021-11-29 DIAGNOSIS — D649 Anemia, unspecified: Secondary | ICD-10-CM

## 2021-11-29 NOTE — Telephone Encounter (Signed)
Zachary [name], can we follow-up on patient's labs? I have not received anything yet.

## 2021-11-30 DIAGNOSIS — D509 Iron deficiency anemia, unspecified: Secondary | ICD-10-CM | POA: Diagnosis not present

## 2021-11-30 DIAGNOSIS — Z992 Dependence on renal dialysis: Secondary | ICD-10-CM | POA: Diagnosis not present

## 2021-11-30 DIAGNOSIS — N186 End stage renal disease: Secondary | ICD-10-CM | POA: Diagnosis not present

## 2021-11-30 DIAGNOSIS — N2581 Secondary hyperparathyroidism of renal origin: Secondary | ICD-10-CM | POA: Diagnosis not present

## 2021-11-30 DIAGNOSIS — D631 Anemia in chronic kidney disease: Secondary | ICD-10-CM | POA: Diagnosis not present

## 2021-12-02 ENCOUNTER — Encounter (HOSPITAL_COMMUNITY): Payer: Self-pay

## 2021-12-02 ENCOUNTER — Encounter (HOSPITAL_COMMUNITY)
Admission: RE | Disposition: A | Discharge status: Skilled Nursing Facility | Payer: Self-pay | Source: Home / Self Care | Attending: Internal Medicine

## 2021-12-02 ENCOUNTER — Ambulatory Visit (HOSPITAL_COMMUNITY): Payer: Medicare Other | Admitting: Anesthesiology

## 2021-12-02 ENCOUNTER — Ambulatory Visit (HOSPITAL_COMMUNITY)
Admission: RE | Admit: 2021-12-02 | Discharge: 2021-12-02 | Disposition: A | Discharge status: Skilled Nursing Facility | Payer: Medicare Other | Attending: Internal Medicine | Admitting: Internal Medicine

## 2021-12-02 ENCOUNTER — Other Ambulatory Visit: Payer: Self-pay

## 2021-12-02 ENCOUNTER — Inpatient Hospital Stay
Admission: RE | Admit: 2021-12-02 | Discharge: 2021-12-03 | Disposition: A | Discharge status: Other Acute Inpatient Hospital | Payer: Medicare Other | Source: Ambulatory Visit | Attending: Internal Medicine | Admitting: Internal Medicine

## 2021-12-02 DIAGNOSIS — I120 Hypertensive chronic kidney disease with stage 5 chronic kidney disease or end stage renal disease: Secondary | ICD-10-CM | POA: Diagnosis not present

## 2021-12-02 DIAGNOSIS — Z85048 Personal history of other malignant neoplasm of rectum, rectosigmoid junction, and anus: Secondary | ICD-10-CM | POA: Insufficient documentation

## 2021-12-02 DIAGNOSIS — Z980 Intestinal bypass and anastomosis status: Secondary | ICD-10-CM | POA: Insufficient documentation

## 2021-12-02 DIAGNOSIS — Z85038 Personal history of other malignant neoplasm of large intestine: Secondary | ICD-10-CM

## 2021-12-02 DIAGNOSIS — Z9049 Acquired absence of other specified parts of digestive tract: Secondary | ICD-10-CM | POA: Insufficient documentation

## 2021-12-02 DIAGNOSIS — N186 End stage renal disease: Secondary | ICD-10-CM | POA: Insufficient documentation

## 2021-12-02 DIAGNOSIS — Z79899 Other long term (current) drug therapy: Secondary | ICD-10-CM | POA: Diagnosis not present

## 2021-12-02 DIAGNOSIS — Z1211 Encounter for screening for malignant neoplasm of colon: Secondary | ICD-10-CM | POA: Diagnosis not present

## 2021-12-02 DIAGNOSIS — E1122 Type 2 diabetes mellitus with diabetic chronic kidney disease: Secondary | ICD-10-CM | POA: Insufficient documentation

## 2021-12-02 DIAGNOSIS — Z86010 Personal history of colonic polyps: Secondary | ICD-10-CM | POA: Insufficient documentation

## 2021-12-02 DIAGNOSIS — E039 Hypothyroidism, unspecified: Secondary | ICD-10-CM | POA: Diagnosis not present

## 2021-12-02 DIAGNOSIS — F79 Unspecified intellectual disabilities: Secondary | ICD-10-CM | POA: Diagnosis not present

## 2021-12-02 DIAGNOSIS — Z08 Encounter for follow-up examination after completed treatment for malignant neoplasm: Secondary | ICD-10-CM | POA: Diagnosis present

## 2021-12-02 DIAGNOSIS — K219 Gastro-esophageal reflux disease without esophagitis: Secondary | ICD-10-CM | POA: Diagnosis not present

## 2021-12-02 HISTORY — PX: FLEXIBLE SIGMOIDOSCOPY: SHX5431

## 2021-12-02 LAB — POCT I-STAT, CHEM 8
BUN: 25 mg/dL — ABNORMAL HIGH (ref 8–23)
Calcium, Ion: 1.12 mmol/L — ABNORMAL LOW (ref 1.15–1.40)
Chloride: 101 mmol/L (ref 98–111)
Creatinine, Ser: 9.8 mg/dL — ABNORMAL HIGH (ref 0.61–1.24)
Glucose, Bld: 85 mg/dL (ref 70–99)
HCT: 31 % — ABNORMAL LOW (ref 39.0–52.0)
Hemoglobin: 10.5 g/dL — ABNORMAL LOW (ref 13.0–17.0)
Potassium: 3.9 mmol/L (ref 3.5–5.1)
Sodium: 140 mmol/L (ref 135–145)
TCO2: 28 mmol/L (ref 22–32)

## 2021-12-02 SURGERY — SIGMOIDOSCOPY, FLEXIBLE
Anesthesia: General

## 2021-12-02 MED ORDER — PROPOFOL 10 MG/ML IV BOLUS
INTRAVENOUS | Status: DC | PRN
  Administered 2021-12-02: 50 mg via INTRAVENOUS
  Administered 2021-12-02: 30 mg via INTRAVENOUS

## 2021-12-02 MED ORDER — SODIUM CHLORIDE 0.9 % IV SOLN
INTRAVENOUS | Status: DC | PRN
Start: 2021-12-02 — End: 2021-12-02

## 2021-12-02 MED ORDER — LACTATED RINGERS IV SOLN: INTRAVENOUS | Status: DC | PRN

## 2021-12-02 MED ORDER — LIDOCAINE HCL (CARDIAC) PF 100 MG/5ML IV SOSY
PREFILLED_SYRINGE | INTRAVENOUS | Status: DC | PRN
  Administered 2021-12-02: 50 mg via INTRAVENOUS

## 2021-12-02 NOTE — Anesthesia Preprocedure Evaluation (Addendum)
Anesthesia Evaluation  Patient identified by MRN, date of birth, ID band Patient awake    Reviewed: Allergy & Precautions, H&P , NPO status , Patient's Chart, lab work & pertinent test results, reviewed documented beta blocker date and time   Airway Mallampati: II  TM Distance: >3 FB Neck ROM: full    Dental no notable dental hx.    Pulmonary neg pulmonary ROS,    Pulmonary exam normal breath sounds clear to auscultation       Cardiovascular Exercise Tolerance: Good hypertension, negative cardio ROS   Rhythm:regular Rate:Normal     Neuro/Psych PSYCHIATRIC DISORDERS Anxiety Depression negative neurological ROS     GI/Hepatic Neg liver ROS, GERD  Medicated,  Endo/Other  diabetes, Well Controlled, Type 2Hypothyroidism   Renal/GU ESRF and DialysisRenal disease  negative genitourinary   Musculoskeletal   Abdominal   Peds  Hematology  (+) Blood dyscrasia, anemia ,   Anesthesia Other Findings   Reproductive/Obstetrics negative OB ROS                            Anesthesia Physical Anesthesia Plan  ASA: 3  Anesthesia Plan: General   Post-op Pain Management:    Induction:   PONV Risk Score and Plan: Propofol infusion  Airway Management Planned:   Additional Equipment:   Intra-op Plan:   Post-operative Plan:   Informed Consent: I have reviewed the patients History and Physical, chart, labs and discussed the procedure including the risks, benefits and alternatives for the proposed anesthesia with the patient or authorized representative who has indicated his/her understanding and acceptance.     Dental Advisory Given  Plan Discussed with: CRNA  Anesthesia Plan Comments:         Anesthesia Quick Evaluation

## 2021-12-02 NOTE — Anesthesia Postprocedure Evaluation (Signed)
Anesthesia Post Note  Patient: Zachary [name]  Procedure(s) Performed: [place]  Patient location during evaluation: Phase II Anesthesia Type: General Level of consciousness: awake Pain management: pain level controlled Vital Signs Assessment: post-procedure vital signs reviewed and stable Respiratory status: spontaneous breathing and respiratory function stable Cardiovascular status: blood pressure returned to baseline and stable Postop Assessment: no headache and no apparent nausea or vomiting Anesthetic complications: no Comments: Late entry   No notable events documented.   Last Vitals:  Vitals:   12/02/21 0943 12/02/21 1040  BP:  130/72  Pulse: 78 77  Resp: 16 14  Temp: 37 C 36.9 C  SpO2: 98% 100%    Last Pain:  Vitals:   12/02/21 1040  TempSrc: Oral  PainSc: 0-No pain                 [name]

## 2021-12-02 NOTE — Op Note (Signed)
[facility] Patient Name: Zachary [name] Procedure Date: 12/02/2021 10:21 AM MRN: [id number] Date of Birth: Feb 16, 1946 Attending MD: Elon Alas. Edgar Frisk CSN: [id number] Age: 76 Admit Type: Outpatient Procedure:                Flexible Sigmoidoscopy Indications:              High risk colon cancer surveillance: Personal                            history of colon cancer Providers:                Elon Alas. Abbey Chatters, DO, Lambert Mody, Lorin Picket, Technician Referring MD:              Medicines:                See the Anesthesia note for documentation of the                            administered medications Complications:            No immediate complications. Estimated Blood Loss:     Estimated blood loss: none. Procedure:                Pre-Anesthesia Assessment:                           - The anesthesia plan was to use monitored                            anesthesia care (MAC).                           After obtaining informed consent, the scope was                            passed under direct vision. The PCF-HQ190L                            ([id number]) scope was introduced through the anus and                            advanced to the the ileo-sigmoid anastomosis. The                            flexible sigmoidoscopy was accomplished without                            difficulty. The patient tolerated the procedure                            well. The quality of the bowel preparation was good. Scope In: 10:31:58 AM Scope Out: 10:35:46 AM Total Procedure Duration: 0 hours 3 minutes 48 seconds  Findings:      The perianal and digital rectal examinations were normal.  There was evidence of a prior side-to-side ileo-colonic anastomosis in       the sigmoid colon. This was patent and was characterized by healthy       appearing mucosa. The anastomosis was traversed.      The exam was otherwise without  abnormality. Impression:               - Patent side-to-side ileo-colonic anastomosis,                            characterized by healthy appearing mucosa.                           - The examination was otherwise normal.                           - No specimens collected. Moderate Sedation:      Per Anesthesia Care Recommendation:           - Patient has a contact number available for                            emergencies. The signs and symptoms of potential                            delayed complications were discussed with the                            patient. Return to normal activities tomorrow.                            Written discharge instructions were provided to the                            patient.                           - Resume previous diet.                           - Repeat flexible sigmoidoscopy in 5 years for                            surveillance. Procedure Code(s):        --- Professional ---                           [phone], Sigmoidoscopy, flexible; diagnostic,                            including collection of specimen(s) by brushing or                            washing, when performed (separate procedure) Diagnosis Code(s):        --- Professional ---                           M35.597, Personal history of other malignant  neoplasm of large intestine                           Z98.0, Intestinal bypass and anastomosis status CPT copyright 2019 American Medical Association. All rights reserved. The codes documented in this report are preliminary and upon coder review may  be revised to meet current compliance requirements. Elon Alas. Abbey Chatters, DO [place] Abbey Chatters, DO 12/02/2021 10:38:49 AM This report has been signed electronically. Number of Addenda: 0

## 2021-12-02 NOTE — Transfer of Care (Signed)
Immediate Anesthesia Transfer of Care Note  Patient: Zachary [name]  Procedure(s) Performed: FLEXIBLE SIGMOIDOSCOPY  Patient Location: Short Stay  Anesthesia Type:General  Level of Consciousness: awake and alert   Airway & Oxygen Therapy: Patient Spontanous Breathing  Post-op Assessment: Report given to RN and Post -op Vital signs reviewed and stable  Post vital signs: Reviewed and stable  Last Vitals:  Vitals Value Taken Time  BP 130/72 12/02/21 1040  Temp 36.9 C 12/02/21 1040  Pulse 77 12/02/21 1040  Resp 14 12/02/21 1040  SpO2 100 % 12/02/21 1040    Last Pain:  Vitals:   12/02/21 1040  TempSrc: Oral  PainSc: 0-No pain      Patients Stated Pain Goal: 6 (35/52/17 4715)  Complications: No notable events documented.

## 2021-12-02 NOTE — Telephone Encounter (Signed)
Called and spoke to [place] the nurse at [facility]. She stated pt had labs done. Will fax over results.

## 2021-12-02 NOTE — Interval H&P Note (Signed)
History and Physical Interval Note:  12/02/2021 10:01 AM  Zachary [name]  has presented today for surgery, with the diagnosis of h/o colon cancer.  The various methods of treatment have been discussed with the patient and family. After consideration of risks, benefits and other options for treatment, the patient has consented to  Procedure(s) with comments: FLEXIBLE SIGMOIDOSCOPY (N/A) - 10:30am, dialysis patient as a surgical intervention.  [name] history has been reviewed, patient examined, no change in status, stable for surgery.  I have reviewed the patient's chart and labs.  Questions were answered to the patient's satisfaction.     Eloise Harman

## 2021-12-02 NOTE — Discharge Instructions (Addendum)
°  Flexible sigmoidoscopy Discharge Instructions  Read the instructions outlined below and refer to this sheet in the next few weeks. These discharge instructions provide you with general information on caring for yourself after you leave the hospital. Your doctor may also give you specific instructions. While your treatment has been planned according to the most current medical practices available, unavoidable complications occasionally occur.   ACTIVITY You may resume your regular activity, but move at a slower pace for the next 24 hours.  Take frequent rest periods for the next 24 hours.  Walking will help get rid of the air and reduce the bloated feeling in your belly (abdomen).  No driving for 24 hours (because of the medicine (anesthesia) used during the test).   Do not sign any important legal documents or operate any machinery for 24 hours (because of the anesthesia used during the test).  NUTRITION Drink plenty of fluids.  You may resume your normal diet as instructed by your doctor.  Begin with a light meal and progress to your normal diet. Heavy or fried foods are harder to digest and may make you feel sick to your stomach (nauseated).  Avoid alcoholic beverages for 24 hours or as instructed.  MEDICATIONS You may resume your normal medications unless your doctor tells you otherwise.  WHAT YOU CAN EXPECT TODAY Some feelings of bloating in the abdomen.  Passage of more gas than usual.  Spotting of blood in your stool or on the toilet paper.  IF YOU HAD POLYPS REMOVED DURING THE COLONOSCOPY: No aspirin products for 7 days or as instructed.  No alcohol for 7 days or as instructed.  Eat a soft diet for the next 24 hours.  FINDING OUT THE RESULTS OF YOUR TEST Not all test results are available during your visit. If your test results are not back during the visit, make an appointment with your caregiver to find out the results. Do not assume everything is normal if you have not heard  from your caregiver or the medical facility. It is important for you to follow up on all of your test results.  SEEK IMMEDIATE MEDICAL ATTENTION IF: You have more than a spotting of blood in your stool.  Your belly is swollen (abdominal distention).  You are nauseated or vomiting.  You have a temperature over 101.  You have abdominal pain or discomfort that is severe or gets worse throughout the day.   Everything looked good today.  No residual polyps or cancer identified.  Recommend repeat flexible sigmoidoscopy in 5 years.  Otherwise follow-up with GI as needed.  I hope you have a great rest of your week!  Elon Alas. Abbey Chatters, D.O. Gastroenterology and Hepatology [facility]

## 2021-12-03 ENCOUNTER — Ambulatory Visit (HOSPITAL_COMMUNITY)
Admission: RE | Admit: 2021-12-03 | Discharge: 2021-12-03 | Disposition: A | Discharge status: Home / Self Care | Payer: Medicare Other | Source: Ambulatory Visit | Attending: Vascular Surgery | Admitting: Vascular Surgery

## 2021-12-03 ENCOUNTER — Encounter (HOSPITAL_COMMUNITY)
Admission: RE | Disposition: A | Discharge status: Home / Self Care | Payer: Self-pay | Source: Ambulatory Visit | Attending: Vascular Surgery

## 2021-12-03 ENCOUNTER — Inpatient Hospital Stay
Admission: RE | Admit: 2021-12-03 | Discharge: 2022-10-01 | Disposition: A | Discharge status: Skilled Nursing Facility | Payer: Medicare Other | Source: Ambulatory Visit | Attending: Internal Medicine | Admitting: Internal Medicine

## 2021-12-03 ENCOUNTER — Telehealth: Payer: Self-pay | Admitting: *Deleted

## 2021-12-03 DIAGNOSIS — Z4901 Encounter for fitting and adjustment of extracorporeal dialysis catheter: Secondary | ICD-10-CM | POA: Insufficient documentation

## 2021-12-03 DIAGNOSIS — N186 End stage renal disease: Secondary | ICD-10-CM | POA: Diagnosis not present

## 2021-12-03 DIAGNOSIS — Z992 Dependence on renal dialysis: Secondary | ICD-10-CM | POA: Diagnosis not present

## 2021-12-03 HISTORY — PX: REMOVAL OF A DIALYSIS CATHETER: SHX6053

## 2021-12-03 SURGERY — REMOVAL, DIALYSIS CATHETER
Anesthesia: LOCAL

## 2021-12-03 MED ORDER — LIDOCAINE HCL (PF) 1 % IJ SOLN
INTRAMUSCULAR | Status: AC
  Filled 2021-12-03: qty 30

## 2021-12-03 SURGICAL SUPPLY — 11 items
APL PRP STRL LF ISPRP CHG 10.5 (MISCELLANEOUS) ×1
APPLICATOR CHLORAPREP 10.5 ORG (MISCELLANEOUS) ×2 IMPLANT
DECANTER SPIKE VIAL GLASS SM (MISCELLANEOUS) ×2 IMPLANT
DRAPE HALF SHEET 40X57 (DRAPES) ×1 IMPLANT
GLOVE SURG POLYISO LF SZ7.5 (GLOVE) ×1 IMPLANT
GLOVE SURG UNDER POLY LF SZ7 (GLOVE) ×1 IMPLANT
NDL HYPO 25X1 1.5 SAFETY (NEEDLE) ×1 IMPLANT
NEEDLE HYPO 25X1 1.5 SAFETY (NEEDLE) ×2 IMPLANT
SPONGE GAUZE 2X2 8PLY STRL LF (GAUZE/BANDAGES/DRESSINGS) ×2 IMPLANT
SYR CONTROL 10ML LL (SYRINGE) ×2 IMPLANT
TOWEL OR 17X26 4PK STRL BLUE (TOWEL DISPOSABLE) ×2 IMPLANT

## 2021-12-03 NOTE — Op Note (Signed)
° ° °  OPERATIVE REPORT  DATE OF SURGERY: 12/03/2021  PATIENT: Zachary [name], 76 y.o. male MRN: [id number]  DOB: [birth year]  PRE-OPERATIVE DIAGNOSIS: End-stage renal disease with functioning arm access  POST-OPERATIVE DIAGNOSIS:  Same  PROCEDURE: Removal of right IJ tunneled hemodialysis catheter  SURGEON:  Curt Jews, M.D.  PHYSICIAN ASSISTANT: Nurse  The assistant was needed for exposure and to expedite the case  ANESTHESIA: 1% lidocaine local  EBL: per anesthesia record  No intake/output data recorded.  BLOOD ADMINISTERED: none  DRAINS: none  SPECIMEN: none  COUNTS CORRECT:  YES  PATIENT DISPOSITION:  PACU - hemodynamically stable  PROCEDURE DETAILS: The area of the right chest and neck and catheter were prepped and draped in usual sterile fashion.  1% lidocaine local was used to infiltrate around the exit site.  The cath was just inside the exit site.  Traction on the catheter was used and hemostat was used to mobilize the felt cuff from the subcutaneous tissue.  The catheter was removed in its entirety.  Pressure was held for hemostasis.  The patient was discharged to home   Zachary [name], M.D., [facility] 12/03/2021 11:28 AM  Note: Portions of this report may have been transcribed using voice recognition software.  Every effort has been made to ensure accuracy; however, inadvertent computerized transcription errors may still be present.

## 2021-12-03 NOTE — Telephone Encounter (Signed)
Transition Care Management Unsuccessful Follow-up Telephone Call  Date of discharge and from where:  12/02/2021  Attempts:  1st Attempt  Reason for unsuccessful TCM follow-up call:  Unable to reach patient [facility]

## 2021-12-04 ENCOUNTER — Encounter: Payer: Self-pay | Admitting: *Deleted

## 2021-12-04 ENCOUNTER — Encounter (HOSPITAL_COMMUNITY): Payer: Self-pay | Admitting: Vascular Surgery

## 2021-12-04 ENCOUNTER — Telehealth: Payer: Self-pay | Admitting: *Deleted

## 2021-12-04 DIAGNOSIS — N186 End stage renal disease: Secondary | ICD-10-CM | POA: Diagnosis not present

## 2021-12-04 DIAGNOSIS — N2581 Secondary hyperparathyroidism of renal origin: Secondary | ICD-10-CM | POA: Diagnosis not present

## 2021-12-04 DIAGNOSIS — D631 Anemia in chronic kidney disease: Secondary | ICD-10-CM | POA: Diagnosis not present

## 2021-12-04 DIAGNOSIS — Z992 Dependence on renal dialysis: Secondary | ICD-10-CM | POA: Diagnosis not present

## 2021-12-04 DIAGNOSIS — D509 Iron deficiency anemia, unspecified: Secondary | ICD-10-CM | POA: Diagnosis not present

## 2021-12-04 NOTE — Telephone Encounter (Signed)
Spoke to [employer] nurse at [facility]. Informed her of results and recommendations. She voiced understanding.

## 2021-12-04 NOTE — Op Note (Signed)
° ° °  OPERATIVE REPORT  DATE OF SURGERY: 12/04/2021  PATIENT: Zachary [name], 76 y.o. male MRN: [id number]  DOB: [birth year]  PRE-OPERATIVE DIAGNOSIS: End-stage renal disease  POST-OPERATIVE DIAGNOSIS:  Same  PROCEDURE: Removal of tunneled hemodialysis catheter  SURGEON:  Curt Jews, M.D.  PHYSICIAN ASSISTANT: Nurse  The assistant was needed for exposure and to expedite the case  ANESTHESIA: 1% lidocaine local  EBL: per anesthesia record  No intake/output data recorded.  BLOOD ADMINISTERED: none  DRAINS: none  SPECIMEN: none  COUNTS CORRECT:  YES  PATIENT DISPOSITION:  PACU - hemodynamically stable  PROCEDURE DETAILS: The area of the catheter and chest were prepped and draped in usual sterile fashion.  1% lidocaine local was used to infiltrate around the catheter exit site and the felt cuff.  The cuff Was mobilized bluntly using hemostats through the insertion site.  Once the catheter cuff was mobilized the catheter was removed in its entirety.  Pressure was held for hemostasis.  Sterile dressing was applied and the patient was discharged home   Zachary [name], M.D., [facility] 12/04/2021 12:36 PM  Note: Portions of this report may have been transcribed using voice recognition software.  Every effort has been made to ensure accuracy; however, inadvertent computerized transcription errors may still be present.

## 2021-12-04 NOTE — H&P (Signed)
° °  [facility] [place]  Patient name: Zachary [name] MRN: [id number] DOB: [birth year] Sex: male  REASON FOR VISIT: Removal of tunneled hemodialysis catheter  HPI: Zachary [name] is a 76 y.o. male here today for removal of tunneled hemodialysis catheter.  Has functioning arm access  No current facility-administered medications for this encounter.   No current outpatient medications on file.     PHYSICAL EXAM: Vitals:   12/03/21 1005  BP: (!) 155/74  Pulse: 70  Resp: 18  Temp: 98.1 F (36.7 C)  SpO2: 100%    GENERAL: The patient is a well-nourished male, in no acute distress. The vital signs are documented above. Excellent thrill in the basilic transposition fistula  MEDICAL ISSUES: Successful use of arm access.  For removal of tunneled catheter and minor procedure room today   Rosetta Posner, MD FACS [facility] Tel [phone]  Note: Portions of this report may have been transcribed using voice recognition software.  Every effort has been made to ensure accuracy; however, inadvertent computerized transcription errors may still be present.

## 2021-12-04 NOTE — Telephone Encounter (Signed)
Received and reviewed labs dated 11/22/2021.  Hemoglobin 9.3 (L), folate 6.6, vitamin B12 272 (low normal), iron 80, iron saturation 39%, ferritin 474 (H).  Most recent hemoglobin in the system 10.5 on 2/6. This is more consistent with his baseline fluctuations.   Called [facility] (dialysis) and verified that patient is getting 50 mg of Venofer Weekly. Spoke with Zachary [name], [place] who stated they usually continue weekly Venofer unless Ferritin gets too high, but at 474, they will continue iron.   It also appears that Zachary Edwards, NP recommended starting vitamin B12 1000 mcg daily on 1/31.   Zachary [name]: Please let patient/his nurse know that I have reviewed his blood work. Looks like his hemoglobin is improving and back within his baseline fluctuations. No iron deficiency.   At this point, I recommend continuing to monitor his hemoglobin with primary care and/or dialysis. Recommend referring back to GI if declining hemoglobin, melena, rectal bleeding, or new onset GI symptoms. These recommendations also need to get to Zachary Edwards, NP who follows with patient at his facility.

## 2021-12-04 NOTE — Telephone Encounter (Signed)
Transition Care Management Unsuccessful Follow-up Telephone Call  Date of discharge and from where:  12/03/2021 [facility]  Attempts:  1st Attempt  Reason for unsuccessful TCM follow-up call:  Unable to reach patient [facility] (Current Admission)

## 2021-12-05 DIAGNOSIS — D631 Anemia in chronic kidney disease: Secondary | ICD-10-CM | POA: Diagnosis not present

## 2021-12-05 DIAGNOSIS — D509 Iron deficiency anemia, unspecified: Secondary | ICD-10-CM | POA: Diagnosis not present

## 2021-12-05 DIAGNOSIS — I120 Hypertensive chronic kidney disease with stage 5 chronic kidney disease or end stage renal disease: Secondary | ICD-10-CM | POA: Diagnosis not present

## 2021-12-05 DIAGNOSIS — N186 End stage renal disease: Secondary | ICD-10-CM | POA: Diagnosis not present

## 2021-12-05 DIAGNOSIS — Z992 Dependence on renal dialysis: Secondary | ICD-10-CM | POA: Diagnosis not present

## 2021-12-05 DIAGNOSIS — N2581 Secondary hyperparathyroidism of renal origin: Secondary | ICD-10-CM | POA: Diagnosis not present

## 2021-12-05 DIAGNOSIS — Z1159 Encounter for screening for other viral diseases: Secondary | ICD-10-CM | POA: Diagnosis not present

## 2021-12-05 DIAGNOSIS — E1129 Type 2 diabetes mellitus with other diabetic kidney complication: Secondary | ICD-10-CM | POA: Diagnosis not present

## 2021-12-06 ENCOUNTER — Encounter: Payer: Self-pay | Admitting: Adult Health

## 2021-12-06 NOTE — Progress Notes (Signed)
Location:  [place] Room Number: 112-D Place of Service:  SNF [phone]) Provider: Ok Edwards, NP  CODE STATUS: FULL CODE  No Known Allergies  Chief Complaint  Patient presents with   Acute Visit    Fever    HPI:    Past Medical History:  Diagnosis Date   Abnormal CT scan, kidney 10/06/2011   Acute pyelonephritis 10/07/2011   Anemia    normocytic   Anxiety    mental retardation   Bladder wall thickening 10/06/2011   BPH (benign prostatic hypertrophy)    Diabetes mellitus    Dialysis patient ([place])    Tuesday, Thursday and Saturday,    DVT of leg (deep venous thrombosis) ([place]) 12/25/2016   Edema     history of lower extremity edema   GERD (gastroesophageal reflux disease)    Heme positive stool    Hydronephrosis    Hyperkalemia    Hyperlipidemia    Hypernatremia    Hypertension    Hypothyroidism    Impaired speech    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[place])    MR (mental retardation)    Muscle weakness    Obstructive uropathy    Perinephric abscess 10/07/2011   Poor historian poor historian   Primary colorectal adenocarcinoma ([place]) 04/2017   S/p total colectomy   Protein calorie malnutrition ([place])    Pyelonephritis    Renal failure (ARF), acute on chronic (HCC)    Renal insufficiency    chronic history   Sepsis ([place])    Smoking    Uremia    Urinary retention    UTI (lower urinary tract infection) 10/06/2011    Past Surgical History:  Procedure Laterality Date   A/V FISTULAGRAM N/A 08/13/2018   Procedure: A/V FISTULAGRAM - Right Upper;  Surgeon: Elam Dutch, MD;  Location: [place] CV LAB;  Service: Cardiovascular;  Laterality: N/A;   A/V FISTULAGRAM N/A 11/22/2018   Procedure: A/V FISTULAGRAM - Right Upper;  Surgeon: Waynetta Sandy, MD;  Location: [place] CV LAB;  Service: Cardiovascular;  Laterality: N/A;   AV FISTULA PLACEMENT Left 07/06/2015   Procedure:  INSERTION LEFT ARM ARTERIOVENOUS GORTEX  GRAFT;  Surgeon: Angelia Mould, MD;  Location: [place];  Service: Vascular;  Laterality: Left;   AV FISTULA PLACEMENT Right 02/26/2016   Procedure: ARTERIOVENOUS (AV) FISTULA CREATION ;  Surgeon: Angelia Mould, MD;  Location: [place];  Service: Vascular;  Laterality: Right;   AV FISTULA PLACEMENT Right 11/25/2018   Procedure: INSERTION OF ARTERIOVENOUS (AV) ARTEGRAFT RIGHT UPPER ARM;  Surgeon: Waynetta Sandy, MD;  Location: [place];  Service: Vascular;  Laterality: Right;   AV FISTULA PLACEMENT Left 05/28/2021   Procedure: LEFT ARM ARTERIOVENOUS (AV) FISTULA;  Surgeon: Rosetta Posner, MD;  [facility];  Service: Vascular;  Laterality: Left;   [place] REMOVAL Left 10/09/2015   Procedure: REMOVAL OF ARTERIOVENOUS GORETEX GRAFT ([place]) Evacuation of Lymphocele, Vein Patch angioplasty of brachial artery.;  Surgeon: Angelia Mould, MD;  Location: [place];  Service: Vascular;  Laterality: Left;   [place] Right 02/26/2016   Procedure: Right [place];  Surgeon: Angelia Mould, MD;  Location: [place];  Service: Vascular;  Laterality: Right;   [place] Left 07/30/2021   Procedure: LEFT ARM SECOND STAGE BASILIC VEIN TRANSPOSITION;  Surgeon: Rosetta Posner, MD;  [facility];  Service: Vascular;  Laterality: Left;   CIRCUMCISION N/A 01/04/2014   Procedure: CIRCUMCISION ADULT (procedure #  1);  Surgeon: Marissa Nestle, MD;  [facility];  Service: Urology;  Laterality: N/A;   COLECTOMY N/A 05/04/2017   Procedure: TOTAL COLECTOMY;  Surgeon: Aviva Signs, MD;  [facility];  Service: General;  Laterality: N/A;   COLONOSCOPY N/A 04/27/2017   Surgeon: Daneil Dolin, MD; annular mass in the ascending colon likely representing cancer biopsied, multiple 6-22 mm polyps removed, clean rectum.  Pathology with multiple tubular adenomas, high-grade dysplasia noted in ascending colon and splenic flexure.   CYSTOSCOPY W/  RETROGRADES Bilateral 06/29/2015   Procedure: CYSTOSCOPY, DILATION OF URETHRAL STRICTURE WITH BILATERAL RETROGRADE PYELOGRAM,SUPRAPUBIC TUBE CHANGE;  Surgeon: Festus Aloe, MD;  Location: [facility];  Service: Urology;  Laterality: Bilateral;   CYSTOSCOPY WITH URETHRAL DILATATION N/A 12/29/2013   Procedure: CYSTOSCOPY WITH URETHRAL DILATATION;  Surgeon: Marissa Nestle, MD;  [facility];  Service: Urology;  Laterality: N/A;   ESOPHAGOGASTRODUODENOSCOPY N/A 04/27/2017   Procedure: ESOPHAGOGASTRODUODENOSCOPY (EGD);  Surgeon: Daneil Dolin, MD;  [facility];  Service: Endoscopy;  Laterality: N/A;   FLEXIBLE SIGMOIDOSCOPY N/A 12/02/2021   Procedure: FLEXIBLE SIGMOIDOSCOPY;  Surgeon: Eloise Harman, DO;  [facility];  Service: Endoscopy;  Laterality: N/A;  10:30am, dialysis patient   INSERTION OF DIALYSIS CATHETER Right 11/25/2018   Procedure: INSERTION OF DIALYSIS CATHETER RIGHT INTERNAL JUGULAR;  Surgeon: Waynetta Sandy, MD;  Location: [place];  Service: Vascular;  Laterality: Right;   IR AV DIALY SHUNT INTRO NEEDLE/INTRACATH INITIAL W/PTA/IMG RIGHT Right 09/07/2018   IR FLUORO GUIDE CV LINE RIGHT  10/16/2020   IR REMOVAL TUN CV CATH W/O FL  01/12/2019   IR THROMBECTOMY AV FISTULA W/THROMBOLYSIS/PTA INC/SHUNT/IMG RIGHT Right 04/26/2018   IR US GUIDE VASC ACCESS RIGHT  04/26/2018   IR US GUIDE VASC ACCESS RIGHT  09/07/2018   IR US GUIDE VASC ACCESS RIGHT  10/16/2020   IR US GUIDE VASC ACCESS RIGHT  10/16/2020   ORIF FEMUR FRACTURE Right 11/22/2016   Procedure: OPEN REDUCTION INTERNAL FIXATION (ORIF) DISTAL FEMUR FRACTURE;  Surgeon: Rod Can, MD;  Location: [place];  Service: Orthopedics;  Laterality: Right;   PATCH ANGIOPLASTY Right 12/10/2017   Procedure: PATCH ANGIOPLASTY;  Surgeon: Angelia Mould, MD;  Location: [place];  Service: Vascular;  Laterality: Right;   PERIPHERAL VASCULAR BALLOON ANGIOPLASTY  08/13/2018   Procedure: PERIPHERAL  VASCULAR BALLOON ANGIOPLASTY;  Surgeon: Elam Dutch, MD;  Location: [place] CV LAB;  Service: Cardiovascular;;  right AV fistula    PERIPHERAL VASCULAR BALLOON ANGIOPLASTY  11/22/2018   Procedure: PERIPHERAL VASCULAR BALLOON ANGIOPLASTY;  Surgeon: Waynetta Sandy, MD;  Location: [place] CV LAB;  Service: Cardiovascular;;  rt AV fistula   PERIPHERAL VASCULAR CATHETERIZATION N/A 10/08/2015   Procedure: A/V Shuntogram;  Surgeon: Angelia Mould, MD;  Location: [place] CV [facility];  Service: Cardiovascular;  Laterality: N/A;   REMOVAL OF A DIALYSIS CATHETER N/A 12/03/2021   Procedure: MINOR REMOVAL OF A TUNNELED DIALYSIS CATHETER;  Surgeon: Rosetta Posner, MD;  [facility];  Service: Vascular;  Laterality: N/A;   THROMBECTOMY W/ EMBOLECTOMY Right 12/10/2017   Procedure: THROMBECTOMY REVISION RIGHT ARM  ARTERIOVENOUS FISTULA;  Surgeon: Angelia Mould, MD;  Location[facility];  Service: Vascular;  Laterality: Right;   TRANSURETHRAL RESECTION OF PROSTATE N/A 01/04/2014   Procedure: TRANSURETHRAL RESECTION OF THE PROSTATE (TURP) (procedure #2);  Surgeon: Marissa Nestle, MD;  [facility];  Service: Urology;  Laterality: N/A;    Social History  Socioeconomic History   Marital status: Single    Spouse name: Not on file   Number of children: Not on file   Years of education: Not on file   Highest education level: Not on file  Occupational History   Occupation: retired   Tobacco Use   Smoking status: Never   Smokeless tobacco: Never  Vaping Use   Vaping Use: Never used  Substance and Sexual Activity   Alcohol use: No   Drug use: No   Sexual activity: Not Currently  Other Topics Concern   Not on file  Social History Narrative   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   [facility] resident of [facility]    Social Determinants of Health   Financial Resource Strain: Not on file  Food Insecurity: Not on file   Transportation Needs: Not on file  Physical Activity: Not on file  Stress: Not on file  Social Connections: Not on file  Intimate Partner Violence: Not on file   Family History  Problem Relation Age of Onset   Cancer Mother    Colon cancer Neg Hx       VITAL SIGNS BP 108/68    Pulse 78    Temp 98.6 F (37 C)    Resp 18    Ht 5\' 8"  (1.727 m)    Wt 214 lb 12.8 oz (97.4 kg)    SpO2 99%    BMI 32.66 kg/m   Outpatient Encounter Medications as of 12/06/2021  Medication Sig   aspirin EC 81 MG tablet Take 81 mg by mouth daily. 0900   atorvastatin (LIPITOR) 20 MG tablet Take 20 mg by mouth at bedtime. 2000   Insulin Glargine (BASAGLAR KWIKPEN) 100 UNIT/ML SOPN Inject 20 Units into the skin at bedtime. 2100 DO NOT HOLD this insulin without provider notification   levothyroxine (SYNTHROID) 125 MCG tablet Take 125 mcg by mouth at bedtime. 2100   midodrine (PROAMATINE) 10 MG tablet Take 10 mg by mouth See admin instructions. Send med with resident on  Tues/Thurs/Sat to dialysis. do not give at [facility], give to resident to take with him. send with 5mg  to equal 15mg . 6:00   midodrine (PROAMATINE) 5 MG tablet Take 5 mg by mouth See admin instructions. Send with resident on dialysis days.  Tues/Thurs/Sat  Take along with 10 mg to equal 15 mg 6:00   NON FORMULARY Diet: NAS, Cons CHO   omega-3 acid ethyl esters (LOVAZA) 1 g capsule Take 2 g by mouth at bedtime. 2100   omeprazole (PRILOSEC) 40 MG capsule Take 40 mg by mouth daily. 0900   polyethylene glycol (MIRALAX / GLYCOLAX) packet Take 17 g by mouth daily as needed for mild constipation.    sevelamer carbonate (RENVELA) 800 MG tablet Take 2,400 mg by mouth 3 (three) times daily with meals. 0900, 1200, 1700   tamsulosin (FLOMAX) 0.4 MG CAPS capsule Take 0.4 mg by mouth daily at 6 PM. Give 30 minutes after a meal. Do not crush or chew   torsemide (DEMADEX) 10 MG tablet Take 50 mg by mouth daily. 1700   vitamin B-12 (CYANOCOBALAMIN) 1000 MCG  tablet Take 1,000 mcg by mouth daily.   No facility-administered encounter medications on file as of 12/06/2021.     SIGNIFICANT DIAGNOSTIC EXAMS       ASSESSMENT/ PLAN:     Ok Edwards NP [facility]  Contact [phone] Monday through Friday 8am- 5pm  After hours call [phone]

## 2021-12-07 DIAGNOSIS — N2581 Secondary hyperparathyroidism of renal origin: Secondary | ICD-10-CM | POA: Diagnosis not present

## 2021-12-07 DIAGNOSIS — Z992 Dependence on renal dialysis: Secondary | ICD-10-CM | POA: Diagnosis not present

## 2021-12-07 DIAGNOSIS — D509 Iron deficiency anemia, unspecified: Secondary | ICD-10-CM | POA: Diagnosis not present

## 2021-12-07 DIAGNOSIS — D631 Anemia in chronic kidney disease: Secondary | ICD-10-CM | POA: Diagnosis not present

## 2021-12-07 DIAGNOSIS — N186 End stage renal disease: Secondary | ICD-10-CM | POA: Diagnosis not present

## 2021-12-09 DIAGNOSIS — Z9181 History of falling: Secondary | ICD-10-CM | POA: Diagnosis not present

## 2021-12-09 DIAGNOSIS — M6281 Muscle weakness (generalized): Secondary | ICD-10-CM | POA: Diagnosis not present

## 2021-12-09 DIAGNOSIS — R262 Difficulty in walking, not elsewhere classified: Secondary | ICD-10-CM | POA: Diagnosis not present

## 2021-12-09 DIAGNOSIS — I120 Hypertensive chronic kidney disease with stage 5 chronic kidney disease or end stage renal disease: Secondary | ICD-10-CM | POA: Diagnosis not present

## 2021-12-09 NOTE — Progress Notes (Signed)
This encounter was created in error - please disregard.

## 2021-12-10 DIAGNOSIS — I120 Hypertensive chronic kidney disease with stage 5 chronic kidney disease or end stage renal disease: Secondary | ICD-10-CM | POA: Diagnosis not present

## 2021-12-10 DIAGNOSIS — N186 End stage renal disease: Secondary | ICD-10-CM | POA: Diagnosis not present

## 2021-12-10 DIAGNOSIS — N2581 Secondary hyperparathyroidism of renal origin: Secondary | ICD-10-CM | POA: Diagnosis not present

## 2021-12-10 DIAGNOSIS — D631 Anemia in chronic kidney disease: Secondary | ICD-10-CM | POA: Diagnosis not present

## 2021-12-10 DIAGNOSIS — Z9181 History of falling: Secondary | ICD-10-CM | POA: Diagnosis not present

## 2021-12-10 DIAGNOSIS — R262 Difficulty in walking, not elsewhere classified: Secondary | ICD-10-CM | POA: Diagnosis not present

## 2021-12-10 DIAGNOSIS — Z992 Dependence on renal dialysis: Secondary | ICD-10-CM | POA: Diagnosis not present

## 2021-12-10 DIAGNOSIS — M6281 Muscle weakness (generalized): Secondary | ICD-10-CM | POA: Diagnosis not present

## 2021-12-10 DIAGNOSIS — D509 Iron deficiency anemia, unspecified: Secondary | ICD-10-CM | POA: Diagnosis not present

## 2021-12-11 DIAGNOSIS — Z9181 History of falling: Secondary | ICD-10-CM | POA: Diagnosis not present

## 2021-12-11 DIAGNOSIS — R262 Difficulty in walking, not elsewhere classified: Secondary | ICD-10-CM | POA: Diagnosis not present

## 2021-12-11 DIAGNOSIS — M6281 Muscle weakness (generalized): Secondary | ICD-10-CM | POA: Diagnosis not present

## 2021-12-11 DIAGNOSIS — I120 Hypertensive chronic kidney disease with stage 5 chronic kidney disease or end stage renal disease: Secondary | ICD-10-CM | POA: Diagnosis not present

## 2021-12-12 DIAGNOSIS — E1129 Type 2 diabetes mellitus with other diabetic kidney complication: Secondary | ICD-10-CM | POA: Diagnosis not present

## 2021-12-12 DIAGNOSIS — D509 Iron deficiency anemia, unspecified: Secondary | ICD-10-CM | POA: Diagnosis not present

## 2021-12-12 DIAGNOSIS — I120 Hypertensive chronic kidney disease with stage 5 chronic kidney disease or end stage renal disease: Secondary | ICD-10-CM | POA: Diagnosis not present

## 2021-12-12 DIAGNOSIS — Z1159 Encounter for screening for other viral diseases: Secondary | ICD-10-CM | POA: Diagnosis not present

## 2021-12-12 DIAGNOSIS — D631 Anemia in chronic kidney disease: Secondary | ICD-10-CM | POA: Diagnosis not present

## 2021-12-12 DIAGNOSIS — Z992 Dependence on renal dialysis: Secondary | ICD-10-CM | POA: Diagnosis not present

## 2021-12-12 DIAGNOSIS — Z9181 History of falling: Secondary | ICD-10-CM | POA: Diagnosis not present

## 2021-12-12 DIAGNOSIS — R262 Difficulty in walking, not elsewhere classified: Secondary | ICD-10-CM | POA: Diagnosis not present

## 2021-12-12 DIAGNOSIS — N2581 Secondary hyperparathyroidism of renal origin: Secondary | ICD-10-CM | POA: Diagnosis not present

## 2021-12-12 DIAGNOSIS — M6281 Muscle weakness (generalized): Secondary | ICD-10-CM | POA: Diagnosis not present

## 2021-12-12 DIAGNOSIS — N186 End stage renal disease: Secondary | ICD-10-CM | POA: Diagnosis not present

## 2021-12-13 DIAGNOSIS — M6281 Muscle weakness (generalized): Secondary | ICD-10-CM | POA: Diagnosis not present

## 2021-12-13 DIAGNOSIS — Z9181 History of falling: Secondary | ICD-10-CM | POA: Diagnosis not present

## 2021-12-13 DIAGNOSIS — I120 Hypertensive chronic kidney disease with stage 5 chronic kidney disease or end stage renal disease: Secondary | ICD-10-CM | POA: Diagnosis not present

## 2021-12-13 DIAGNOSIS — R262 Difficulty in walking, not elsewhere classified: Secondary | ICD-10-CM | POA: Diagnosis not present

## 2021-12-14 DIAGNOSIS — D509 Iron deficiency anemia, unspecified: Secondary | ICD-10-CM | POA: Diagnosis not present

## 2021-12-14 DIAGNOSIS — D631 Anemia in chronic kidney disease: Secondary | ICD-10-CM | POA: Diagnosis not present

## 2021-12-14 DIAGNOSIS — N2581 Secondary hyperparathyroidism of renal origin: Secondary | ICD-10-CM | POA: Diagnosis not present

## 2021-12-14 DIAGNOSIS — Z992 Dependence on renal dialysis: Secondary | ICD-10-CM | POA: Diagnosis not present

## 2021-12-14 DIAGNOSIS — N186 End stage renal disease: Secondary | ICD-10-CM | POA: Diagnosis not present

## 2021-12-17 DIAGNOSIS — N186 End stage renal disease: Secondary | ICD-10-CM | POA: Diagnosis not present

## 2021-12-17 DIAGNOSIS — M6281 Muscle weakness (generalized): Secondary | ICD-10-CM | POA: Diagnosis not present

## 2021-12-17 DIAGNOSIS — I120 Hypertensive chronic kidney disease with stage 5 chronic kidney disease or end stage renal disease: Secondary | ICD-10-CM | POA: Diagnosis not present

## 2021-12-17 DIAGNOSIS — Z9181 History of falling: Secondary | ICD-10-CM | POA: Diagnosis not present

## 2021-12-17 DIAGNOSIS — R262 Difficulty in walking, not elsewhere classified: Secondary | ICD-10-CM | POA: Diagnosis not present

## 2021-12-17 DIAGNOSIS — Z992 Dependence on renal dialysis: Secondary | ICD-10-CM | POA: Diagnosis not present

## 2021-12-17 DIAGNOSIS — D631 Anemia in chronic kidney disease: Secondary | ICD-10-CM | POA: Diagnosis not present

## 2021-12-17 DIAGNOSIS — D509 Iron deficiency anemia, unspecified: Secondary | ICD-10-CM | POA: Diagnosis not present

## 2021-12-17 DIAGNOSIS — N2581 Secondary hyperparathyroidism of renal origin: Secondary | ICD-10-CM | POA: Diagnosis not present

## 2021-12-18 DIAGNOSIS — Z9181 History of falling: Secondary | ICD-10-CM | POA: Diagnosis not present

## 2021-12-18 DIAGNOSIS — R262 Difficulty in walking, not elsewhere classified: Secondary | ICD-10-CM | POA: Diagnosis not present

## 2021-12-18 DIAGNOSIS — I120 Hypertensive chronic kidney disease with stage 5 chronic kidney disease or end stage renal disease: Secondary | ICD-10-CM | POA: Diagnosis not present

## 2021-12-18 DIAGNOSIS — M6281 Muscle weakness (generalized): Secondary | ICD-10-CM | POA: Diagnosis not present

## 2021-12-19 DIAGNOSIS — Z9181 History of falling: Secondary | ICD-10-CM | POA: Diagnosis not present

## 2021-12-19 DIAGNOSIS — R262 Difficulty in walking, not elsewhere classified: Secondary | ICD-10-CM | POA: Diagnosis not present

## 2021-12-19 DIAGNOSIS — N186 End stage renal disease: Secondary | ICD-10-CM | POA: Diagnosis not present

## 2021-12-19 DIAGNOSIS — Z1159 Encounter for screening for other viral diseases: Secondary | ICD-10-CM | POA: Diagnosis not present

## 2021-12-19 DIAGNOSIS — N2581 Secondary hyperparathyroidism of renal origin: Secondary | ICD-10-CM | POA: Diagnosis not present

## 2021-12-19 DIAGNOSIS — M6281 Muscle weakness (generalized): Secondary | ICD-10-CM | POA: Diagnosis not present

## 2021-12-19 DIAGNOSIS — D509 Iron deficiency anemia, unspecified: Secondary | ICD-10-CM | POA: Diagnosis not present

## 2021-12-19 DIAGNOSIS — I120 Hypertensive chronic kidney disease with stage 5 chronic kidney disease or end stage renal disease: Secondary | ICD-10-CM | POA: Diagnosis not present

## 2021-12-19 DIAGNOSIS — E1129 Type 2 diabetes mellitus with other diabetic kidney complication: Secondary | ICD-10-CM | POA: Diagnosis not present

## 2021-12-19 DIAGNOSIS — D631 Anemia in chronic kidney disease: Secondary | ICD-10-CM | POA: Diagnosis not present

## 2021-12-19 DIAGNOSIS — Z992 Dependence on renal dialysis: Secondary | ICD-10-CM | POA: Diagnosis not present

## 2021-12-20 ENCOUNTER — Non-Acute Institutional Stay (SKILLED_NURSING_FACILITY): Payer: Medicare Other | Admitting: Adult Health

## 2021-12-20 ENCOUNTER — Encounter: Payer: Self-pay | Admitting: Adult Health

## 2021-12-20 DIAGNOSIS — I700 Atherosclerosis of aorta: Secondary | ICD-10-CM | POA: Diagnosis not present

## 2021-12-20 DIAGNOSIS — E1122 Type 2 diabetes mellitus with diabetic chronic kidney disease: Secondary | ICD-10-CM | POA: Diagnosis not present

## 2021-12-20 DIAGNOSIS — Z992 Dependence on renal dialysis: Secondary | ICD-10-CM

## 2021-12-20 DIAGNOSIS — N186 End stage renal disease: Secondary | ICD-10-CM

## 2021-12-20 DIAGNOSIS — R262 Difficulty in walking, not elsewhere classified: Secondary | ICD-10-CM | POA: Diagnosis not present

## 2021-12-20 DIAGNOSIS — M6281 Muscle weakness (generalized): Secondary | ICD-10-CM | POA: Diagnosis not present

## 2021-12-20 DIAGNOSIS — Z9181 History of falling: Secondary | ICD-10-CM | POA: Diagnosis not present

## 2021-12-20 DIAGNOSIS — I120 Hypertensive chronic kidney disease with stage 5 chronic kidney disease or end stage renal disease: Secondary | ICD-10-CM | POA: Diagnosis not present

## 2021-12-20 NOTE — Progress Notes (Signed)
Location:  [place] Room Number: 112-D Place of Service:  SNF (31)   CODE STATUS: Full Code  No Known Allergies  Chief Complaint  Patient presents with   Acute Visit    Care plan meeting    HPI:  We have come together for her care plan meeting. BIMS 6/15 mood 0/30. He is independent to supervision for his adl care. He is continent of bladder and bowel. There have been no recent falls. Dietary: NAS CON CHO 214.8 pounds 1200 cc fluid restriction; good appetite; feeds self; he does not adherent to restrictions. Therapy: strengthening; balance endurance. He continues to be followed for his chronic illnesses including:  Aortic atherosclerosis  Dependence on renal dialysis  End stage renal disease on dialysis due to type 2 diabetes mellitus  Past Medical History:  Diagnosis Date   Abnormal CT scan, kidney 10/06/2011   Acute pyelonephritis 10/07/2011   Anemia    normocytic   Anxiety    mental retardation   Bladder wall thickening 10/06/2011   BPH (benign prostatic hypertrophy)    Diabetes mellitus    Dialysis patient ([place])    Tuesday, Thursday and Saturday,    DVT of leg (deep venous thrombosis) ([place]) 12/25/2016   Edema     history of lower extremity edema   GERD (gastroesophageal reflux disease)    Heme positive stool    Hydronephrosis    Hyperkalemia    Hyperlipidemia    Hypernatremia    Hypertension    Hypothyroidism    Impaired speech    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[place])    MR (mental retardation)    Muscle weakness    Obstructive uropathy    Perinephric abscess 10/07/2011   Poor historian poor historian   Primary colorectal adenocarcinoma ([place]) 04/2017   S/p total colectomy   Protein calorie malnutrition ([place])    Pyelonephritis    Renal failure (ARF), acute on chronic (HCC)    Renal insufficiency    chronic history   Sepsis ([place])    Smoking    Uremia    Urinary retention    UTI (lower urinary tract infection) 10/06/2011     Past Surgical History:  Procedure Laterality Date   A/V FISTULAGRAM N/A 08/13/2018   Procedure: A/V FISTULAGRAM - Right Upper;  Surgeon: Elam Dutch, MD;  Location: [facility];  Service: Cardiovascular;  Laterality: N/A;   A/V FISTULAGRAM N/A 11/22/2018   Procedure: A/V FISTULAGRAM - Right Upper;  Surgeon: Waynetta Sandy, MD;  Location: [facility];  Service: Cardiovascular;  Laterality: N/A;   AV FISTULA PLACEMENT Left 07/06/2015   Procedure:  INSERTION LEFT ARM ARTERIOVENOUS GORTEX GRAFT;  Surgeon: Angelia Mould, MD;  Location: [facility];  Service: Vascular;  Laterality: Left;   AV FISTULA PLACEMENT Right 02/26/2016   Procedure: ARTERIOVENOUS (AV) FISTULA CREATION ;  Surgeon: Angelia Mould, MD;  Location: [facility];  Service: Vascular;  Laterality: Right;   AV FISTULA PLACEMENT Right 11/25/2018   Procedure: INSERTION OF ARTERIOVENOUS (AV) ARTEGRAFT RIGHT UPPER ARM;  Surgeon: Waynetta Sandy, MD;  Location: [facility];  Service: Vascular;  Laterality: Right;   AV FISTULA PLACEMENT Left 05/28/2021   Procedure: LEFT ARM ARTERIOVENOUS (AV) FISTULA;  Surgeon: Rosetta Posner, MD;  [facility];  Service: Vascular;  Laterality: Left;   [place] REMOVAL Left 10/09/2015   Procedure: REMOVAL OF ARTERIOVENOUS GORETEX GRAFT ([place]) Evacuation of Lymphocele, Vein Patch angioplasty of brachial artery.;  Surgeon:  Angelia Mould, MD;  Location: [facility];  Service: Vascular;  Laterality: Left;   [place] Right 02/26/2016   Procedure: Right [place];  Surgeon: Angelia Mould, MD;  Location: [facility];  Service: Vascular;  Laterality: Right;   [place] Left 07/30/2021   Procedure: LEFT ARM SECOND STAGE BASILIC VEIN TRANSPOSITION;  Surgeon: Rosetta Posner, MD;  [facility];  Service: Vascular;  Laterality: Left;   CIRCUMCISION N/A 01/04/2014   Procedure: CIRCUMCISION ADULT (procedure #1);  Surgeon:  Marissa Nestle, MD;  [facility];  Service: Urology;  Laterality: N/A;   COLECTOMY N/A 05/04/2017   Procedure: TOTAL COLECTOMY;  Surgeon: Aviva Signs, MD;  [facility];  Service: General;  Laterality: N/A;   COLONOSCOPY N/A 04/27/2017   Surgeon: Daneil Dolin, MD; annular mass in the ascending colon likely representing cancer biopsied, multiple 6-22 mm polyps removed, clean rectum.  Pathology with multiple tubular adenomas, high-grade dysplasia noted in ascending colon and splenic flexure.   CYSTOSCOPY W/ RETROGRADES Bilateral 06/29/2015   Procedure: CYSTOSCOPY, DILATION OF URETHRAL STRICTURE WITH BILATERAL RETROGRADE PYELOGRAM,SUPRAPUBIC TUBE CHANGE;  Surgeon: Festus Aloe, MD;  Location: [facility];  Service: Urology;  Laterality: Bilateral;   CYSTOSCOPY WITH URETHRAL DILATATION N/A 12/29/2013   Procedure: CYSTOSCOPY WITH URETHRAL DILATATION;  Surgeon: Marissa Nestle, MD;  [facility];  Service: Urology;  Laterality: N/A;   ESOPHAGOGASTRODUODENOSCOPY N/A 04/27/2017   Procedure: ESOPHAGOGASTRODUODENOSCOPY (EGD);  Surgeon: Daneil Dolin, MD;  [facility];  Service: Endoscopy;  Laterality: N/A;   FLEXIBLE SIGMOIDOSCOPY N/A 12/02/2021   Procedure: FLEXIBLE SIGMOIDOSCOPY;  Surgeon: Eloise Harman, DO;  [facility];  Service: Endoscopy;  Laterality: N/A;  10:30am, dialysis patient   INSERTION OF DIALYSIS CATHETER Right 11/25/2018   Procedure: INSERTION OF DIALYSIS CATHETER RIGHT INTERNAL JUGULAR;  Surgeon: Waynetta Sandy, MD;  Location: [place];  Service: Vascular;  Laterality: Right;   IR AV DIALY SHUNT INTRO NEEDLE/INTRACATH INITIAL W/PTA/IMG RIGHT Right 09/07/2018   IR FLUORO GUIDE CV LINE RIGHT  10/16/2020   IR REMOVAL TUN CV CATH W/O FL  01/12/2019   IR THROMBECTOMY AV FISTULA W/THROMBOLYSIS/PTA INC/SHUNT/IMG RIGHT Right 04/26/2018   IR US GUIDE VASC ACCESS RIGHT  04/26/2018   IR US GUIDE VASC ACCESS RIGHT  09/07/2018   IR US GUIDE  VASC ACCESS RIGHT  10/16/2020   IR US GUIDE VASC ACCESS RIGHT  10/16/2020   ORIF FEMUR FRACTURE Right 11/22/2016   Procedure: OPEN REDUCTION INTERNAL FIXATION (ORIF) DISTAL FEMUR FRACTURE;  Surgeon: Rod Can, MD;  Location: [facility];  Service: Orthopedics;  Laterality: Right;   PATCH ANGIOPLASTY Right 12/10/2017   Procedure: PATCH ANGIOPLASTY;  Surgeon: Angelia Mould, MD;  Location: [facility];  Service: Vascular;  Laterality: Right;   PERIPHERAL VASCULAR BALLOON ANGIOPLASTY  08/13/2018   Procedure: PERIPHERAL VASCULAR BALLOON ANGIOPLASTY;  Surgeon: Elam Dutch, MD;  Location: [facility];  Service: Cardiovascular;;  right AV fistula    PERIPHERAL VASCULAR BALLOON ANGIOPLASTY  11/22/2018   Procedure: PERIPHERAL VASCULAR BALLOON ANGIOPLASTY;  Surgeon: Waynetta Sandy, MD;  Location: [facility];  Service: Cardiovascular;;  rt AV fistula   PERIPHERAL VASCULAR CATHETERIZATION N/A 10/08/2015   Procedure: A/V Shuntogram;  Surgeon: Angelia Mould, MD;  Location: [facility];  Service: Cardiovascular;  Laterality: N/A;   REMOVAL OF A DIALYSIS CATHETER N/A 12/03/2021   Procedure: MINOR REMOVAL OF A TUNNELED DIALYSIS CATHETER;  Surgeon: Rosetta Posner, MD;  [facility];  Service: Vascular;  Laterality: N/A;   THROMBECTOMY W/ EMBOLECTOMY Right 12/10/2017   Procedure: THROMBECTOMY REVISION RIGHT ARM  ARTERIOVENOUS FISTULA;  Surgeon: Angelia Mould, MD;  [facility]: [facility];  Service: Vascular;  Laterality: Right;   TRANSURETHRAL RESECTION OF PROSTATE N/A 01/04/2014   Procedure: TRANSURETHRAL RESECTION OF THE PROSTATE (TURP) (procedure #2);  Surgeon: Marissa Nestle, MD;  [facility];  Service: Urology;  Laterality: N/A;    Social History   Socioeconomic History   Marital status: Single    Spouse name: Not on file   Number of children: Not on file   Years of education: Not on file   Highest education level: Not on file  Occupational  History   Occupation: retired   Tobacco Use   Smoking status: Never   Smokeless tobacco: Never  Vaping Use   Vaping Use: Never used  Substance and Sexual Activity   Alcohol use: No   Drug use: No   Sexual activity: Not Currently  Other Topics Concern   Not on file  Social History Narrative   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   [facility]    Social Determinants of Health   Financial Resource Strain: Not on file  Food Insecurity: Not on file  Transportation Needs: Not on file  Physical Activity: Not on file  Stress: Not on file  Social Connections: Not on file  Intimate Partner Violence: Not on file   Family History  Problem Relation Age of Onset   Cancer Mother    Colon cancer Neg Hx       VITAL SIGNS BP (!) 100/54    Pulse 88    Temp 97.8 F (36.6 C)    Ht 5\' 8"  (1.727 m)    Wt 214 lb 12.8 oz (97.4 kg)    BMI 32.66 kg/m   Outpatient Encounter Medications as of 12/20/2021  Medication Sig   aspirin EC 81 MG tablet Take 81 mg by mouth daily. 0900   atorvastatin (LIPITOR) 20 MG tablet Take 20 mg by mouth at bedtime. 2000   Insulin Glargine (BASAGLAR KWIKPEN) 100 UNIT/ML SOPN Inject 20 Units into the skin at bedtime. 2100 DO NOT HOLD this insulin without provider notification   levothyroxine (SYNTHROID) 125 MCG tablet Take 125 mcg by mouth at bedtime. 2100   midodrine (PROAMATINE) 10 MG tablet Take 10 mg by mouth See admin instructions. Send med with resident on  Tues/Thurs/Sat to dialysis. do not give [facility], give to resident to take with him. send with 5mg  to equal 15mg . 6:00   midodrine (PROAMATINE) 5 MG tablet Take 5 mg by mouth See admin instructions. Send with resident on dialysis days.  Tues/Thurs/Sat  Take along with 10 mg to equal 15 mg 6:00   NON FORMULARY Diet: NAS, Cons CHO   omega-3 acid ethyl esters (LOVAZA) 1 g capsule Take 2 g by mouth at bedtime. 2100   omeprazole (PRILOSEC) 40 MG capsule  Take 40 mg by mouth daily. 0900   polyethylene glycol (MIRALAX / GLYCOLAX) packet Take 17 g by mouth daily as needed for mild constipation.    sevelamer carbonate (RENVELA) 800 MG tablet Take 2,400 mg by mouth 3 (three) times daily with meals. 0900, 1200, 1700   tamsulosin (FLOMAX) 0.4 MG CAPS capsule Take 0.4 mg by mouth daily at 6 PM. Give 30 minutes after a meal. Do not crush or  chew   vitamin B-12 (CYANOCOBALAMIN) 1000 MCG tablet Take 1,000 mcg by mouth daily.   [DISCONTINUED] torsemide (DEMADEX) 10 MG tablet Take 50 mg by mouth daily. 1700   No facility-administered encounter medications on file as of 12/20/2021.     SIGNIFICANT DIAGNOSTIC EXAMS  PREVIOUS   05-02-21: Chest x-ray: dialysis cath demonstrated on right. No acute cardiopulmonary process  NO NEW EXAMS.    LABS REVIEWED PREVIOUS;   01-28-21: hgb a1c 6.5  03-07-21: tsh 5.008  05-02-21: wbc 3.8; hgb 10.9; hct 33.7; mcv 105.6 plt 106; glucose 105; bun 17; creat 7.94; k+ 3.8; na++ 138; ca 9.1; GFR 7; d-dimer: 0.95; CRP 0.7 06-12-21: tsh 5.269  06-27-21: hgb a1c 5.6 07-30-21: hgb 11.6; hct 34.0; glucose 104; bun 12; creat 7.30; k+ 5.2; na++ 135; ionized ca++ 0.8  08-12-21: tsh 0.822 11-07-21: wbc 4,5 hgb 9.5; hct 29.5; mcv 103.9 plt 107; glucose 129; bun 28; creat 9.01; k+ 4.5; na++ 138; ca 8.6; GFR 6; liver normal albumin 3.1; hgb a1c 5.8; chol 60 ldl 18; trig 71; hdl 28; tsh 1.538  11-22-21: hgb 9,3; hct 28.4; iron 80; tibc 203; ferritin 474; vitamin B 12: 272 folate 6.6   NO NEW LABS.   Review of Systems  Constitutional:  Negative for malaise/fatigue.  Respiratory:  Negative for cough and shortness of breath.   Cardiovascular:  Negative for chest pain, palpitations and leg swelling.  Gastrointestinal:  Negative for abdominal pain, constipation and heartburn.  Musculoskeletal:  Negative for back pain, joint pain and myalgias.  Skin: Negative.   Neurological:  Negative for dizziness.  Psychiatric/Behavioral:  The patient is not  nervous/anxious.    Physical Exam Constitutional:      General: He is not in acute distress.    Appearance: He is well-developed. He is not diaphoretic.  Neck:     Thyroid: No thyromegaly.  Cardiovascular:     Rate and Rhythm: Normal rate and regular rhythm.     Pulses: Normal pulses.     Heart sounds: Normal heart sounds.  Pulmonary:     Effort: Pulmonary effort is normal. No respiratory distress.     Breath sounds: Normal breath sounds.  Abdominal:     General: Bowel sounds are normal. There is no distension.     Palpations: Abdomen is soft.     Tenderness: There is no abdominal tenderness.  Musculoskeletal:        General: Normal range of motion.     Cervical back: Neck supple.  Lymphadenopathy:     Cervical: No cervical adenopathy.  Skin:    General: Skin is warm and dry.     Comments: Left arm fistula + bruit + thrill    Neurological:     Mental Status: He is alert. Mental status is at baseline.  Psychiatric:        Mood and Affect: Mood normal.      ASSESSMENT/ PLAN:  TODAY  Aortic atherosclerosis Dependence on renal dialysis  End stage renal disease on dialysis due to type 2 diabetes mellitus  Will continue current medications Will continue current plan of care Will continue to monitor his status   Time spent with patient: 40 minutes: plan of care therapy and medications.    Ok Edwards NP [facility]   call [phone]

## 2021-12-21 DIAGNOSIS — Z992 Dependence on renal dialysis: Secondary | ICD-10-CM | POA: Diagnosis not present

## 2021-12-21 DIAGNOSIS — R262 Difficulty in walking, not elsewhere classified: Secondary | ICD-10-CM | POA: Diagnosis not present

## 2021-12-21 DIAGNOSIS — N186 End stage renal disease: Secondary | ICD-10-CM | POA: Diagnosis not present

## 2021-12-21 DIAGNOSIS — N2581 Secondary hyperparathyroidism of renal origin: Secondary | ICD-10-CM | POA: Diagnosis not present

## 2021-12-21 DIAGNOSIS — D631 Anemia in chronic kidney disease: Secondary | ICD-10-CM | POA: Diagnosis not present

## 2021-12-21 DIAGNOSIS — M6281 Muscle weakness (generalized): Secondary | ICD-10-CM | POA: Diagnosis not present

## 2021-12-21 DIAGNOSIS — Z9181 History of falling: Secondary | ICD-10-CM | POA: Diagnosis not present

## 2021-12-21 DIAGNOSIS — I120 Hypertensive chronic kidney disease with stage 5 chronic kidney disease or end stage renal disease: Secondary | ICD-10-CM | POA: Diagnosis not present

## 2021-12-21 DIAGNOSIS — D509 Iron deficiency anemia, unspecified: Secondary | ICD-10-CM | POA: Diagnosis not present

## 2021-12-23 DIAGNOSIS — I120 Hypertensive chronic kidney disease with stage 5 chronic kidney disease or end stage renal disease: Secondary | ICD-10-CM | POA: Diagnosis not present

## 2021-12-23 DIAGNOSIS — R262 Difficulty in walking, not elsewhere classified: Secondary | ICD-10-CM | POA: Diagnosis not present

## 2021-12-23 DIAGNOSIS — M6281 Muscle weakness (generalized): Secondary | ICD-10-CM | POA: Diagnosis not present

## 2021-12-23 DIAGNOSIS — Z9181 History of falling: Secondary | ICD-10-CM | POA: Diagnosis not present

## 2021-12-24 DIAGNOSIS — N2581 Secondary hyperparathyroidism of renal origin: Secondary | ICD-10-CM | POA: Diagnosis not present

## 2021-12-24 DIAGNOSIS — D631 Anemia in chronic kidney disease: Secondary | ICD-10-CM | POA: Diagnosis not present

## 2021-12-24 DIAGNOSIS — M6281 Muscle weakness (generalized): Secondary | ICD-10-CM | POA: Diagnosis not present

## 2021-12-24 DIAGNOSIS — R262 Difficulty in walking, not elsewhere classified: Secondary | ICD-10-CM | POA: Diagnosis not present

## 2021-12-24 DIAGNOSIS — Z9181 History of falling: Secondary | ICD-10-CM | POA: Diagnosis not present

## 2021-12-24 DIAGNOSIS — N186 End stage renal disease: Secondary | ICD-10-CM | POA: Diagnosis not present

## 2021-12-24 DIAGNOSIS — D509 Iron deficiency anemia, unspecified: Secondary | ICD-10-CM | POA: Diagnosis not present

## 2021-12-24 DIAGNOSIS — I120 Hypertensive chronic kidney disease with stage 5 chronic kidney disease or end stage renal disease: Secondary | ICD-10-CM | POA: Diagnosis not present

## 2021-12-24 DIAGNOSIS — Z992 Dependence on renal dialysis: Secondary | ICD-10-CM | POA: Diagnosis not present

## 2021-12-25 DIAGNOSIS — Z9181 History of falling: Secondary | ICD-10-CM | POA: Diagnosis not present

## 2021-12-25 DIAGNOSIS — M6281 Muscle weakness (generalized): Secondary | ICD-10-CM | POA: Diagnosis not present

## 2021-12-25 DIAGNOSIS — R262 Difficulty in walking, not elsewhere classified: Secondary | ICD-10-CM | POA: Diagnosis not present

## 2021-12-25 DIAGNOSIS — I120 Hypertensive chronic kidney disease with stage 5 chronic kidney disease or end stage renal disease: Secondary | ICD-10-CM | POA: Diagnosis not present

## 2021-12-26 DIAGNOSIS — Z1159 Encounter for screening for other viral diseases: Secondary | ICD-10-CM | POA: Diagnosis not present

## 2021-12-26 DIAGNOSIS — Z9181 History of falling: Secondary | ICD-10-CM | POA: Diagnosis not present

## 2021-12-26 DIAGNOSIS — N2581 Secondary hyperparathyroidism of renal origin: Secondary | ICD-10-CM | POA: Diagnosis not present

## 2021-12-26 DIAGNOSIS — Z992 Dependence on renal dialysis: Secondary | ICD-10-CM | POA: Diagnosis not present

## 2021-12-26 DIAGNOSIS — D631 Anemia in chronic kidney disease: Secondary | ICD-10-CM | POA: Diagnosis not present

## 2021-12-26 DIAGNOSIS — R262 Difficulty in walking, not elsewhere classified: Secondary | ICD-10-CM | POA: Diagnosis not present

## 2021-12-26 DIAGNOSIS — M6281 Muscle weakness (generalized): Secondary | ICD-10-CM | POA: Diagnosis not present

## 2021-12-26 DIAGNOSIS — I120 Hypertensive chronic kidney disease with stage 5 chronic kidney disease or end stage renal disease: Secondary | ICD-10-CM | POA: Diagnosis not present

## 2021-12-26 DIAGNOSIS — E1129 Type 2 diabetes mellitus with other diabetic kidney complication: Secondary | ICD-10-CM | POA: Diagnosis not present

## 2021-12-26 DIAGNOSIS — N186 End stage renal disease: Secondary | ICD-10-CM | POA: Diagnosis not present

## 2021-12-26 DIAGNOSIS — D509 Iron deficiency anemia, unspecified: Secondary | ICD-10-CM | POA: Diagnosis not present

## 2021-12-27 DIAGNOSIS — M6281 Muscle weakness (generalized): Secondary | ICD-10-CM | POA: Diagnosis not present

## 2021-12-27 DIAGNOSIS — R262 Difficulty in walking, not elsewhere classified: Secondary | ICD-10-CM | POA: Diagnosis not present

## 2021-12-27 DIAGNOSIS — Z9181 History of falling: Secondary | ICD-10-CM | POA: Diagnosis not present

## 2021-12-27 DIAGNOSIS — I120 Hypertensive chronic kidney disease with stage 5 chronic kidney disease or end stage renal disease: Secondary | ICD-10-CM | POA: Diagnosis not present

## 2021-12-28 DIAGNOSIS — D631 Anemia in chronic kidney disease: Secondary | ICD-10-CM | POA: Diagnosis not present

## 2021-12-28 DIAGNOSIS — Z992 Dependence on renal dialysis: Secondary | ICD-10-CM | POA: Diagnosis not present

## 2021-12-28 DIAGNOSIS — N2581 Secondary hyperparathyroidism of renal origin: Secondary | ICD-10-CM | POA: Diagnosis not present

## 2021-12-28 DIAGNOSIS — D509 Iron deficiency anemia, unspecified: Secondary | ICD-10-CM | POA: Diagnosis not present

## 2021-12-28 DIAGNOSIS — N186 End stage renal disease: Secondary | ICD-10-CM | POA: Diagnosis not present

## 2021-12-30 ENCOUNTER — Ambulatory Visit (HOSPITAL_COMMUNITY)
Admission: RE | Admit: 2021-12-30 | Discharge: 2021-12-30 | Disposition: A | Discharge status: Home / Self Care | Payer: Medicare Other | Source: Ambulatory Visit | Attending: Gastroenterology | Admitting: Gastroenterology

## 2021-12-30 DIAGNOSIS — M6281 Muscle weakness (generalized): Secondary | ICD-10-CM | POA: Diagnosis not present

## 2021-12-30 DIAGNOSIS — R262 Difficulty in walking, not elsewhere classified: Secondary | ICD-10-CM | POA: Diagnosis not present

## 2021-12-30 DIAGNOSIS — I120 Hypertensive chronic kidney disease with stage 5 chronic kidney disease or end stage renal disease: Secondary | ICD-10-CM | POA: Diagnosis not present

## 2021-12-30 DIAGNOSIS — K869 Disease of pancreas, unspecified: Secondary | ICD-10-CM | POA: Diagnosis not present

## 2021-12-30 DIAGNOSIS — K8020 Calculus of gallbladder without cholecystitis without obstruction: Secondary | ICD-10-CM | POA: Diagnosis not present

## 2021-12-30 DIAGNOSIS — Z9181 History of falling: Secondary | ICD-10-CM | POA: Diagnosis not present

## 2021-12-30 DIAGNOSIS — N200 Calculus of kidney: Secondary | ICD-10-CM | POA: Diagnosis not present

## 2021-12-30 DIAGNOSIS — N261 Atrophy of kidney (terminal): Secondary | ICD-10-CM | POA: Diagnosis not present

## 2021-12-30 DIAGNOSIS — N281 Cyst of kidney, acquired: Secondary | ICD-10-CM | POA: Diagnosis not present

## 2021-12-30 MED ORDER — IOHEXOL 350 MG/ML SOLN
50.0000 mL | Freq: Once | INTRAVENOUS | Status: AC | PRN
  Administered 2021-12-30: 80 mL via INTRAVENOUS

## 2021-12-31 DIAGNOSIS — N2581 Secondary hyperparathyroidism of renal origin: Secondary | ICD-10-CM | POA: Diagnosis not present

## 2021-12-31 DIAGNOSIS — D509 Iron deficiency anemia, unspecified: Secondary | ICD-10-CM | POA: Diagnosis not present

## 2021-12-31 DIAGNOSIS — Z992 Dependence on renal dialysis: Secondary | ICD-10-CM | POA: Diagnosis not present

## 2021-12-31 DIAGNOSIS — D631 Anemia in chronic kidney disease: Secondary | ICD-10-CM | POA: Diagnosis not present

## 2021-12-31 DIAGNOSIS — N186 End stage renal disease: Secondary | ICD-10-CM | POA: Diagnosis not present

## 2022-01-01 DIAGNOSIS — I120 Hypertensive chronic kidney disease with stage 5 chronic kidney disease or end stage renal disease: Secondary | ICD-10-CM | POA: Diagnosis not present

## 2022-01-01 DIAGNOSIS — Z9181 History of falling: Secondary | ICD-10-CM | POA: Diagnosis not present

## 2022-01-01 DIAGNOSIS — R262 Difficulty in walking, not elsewhere classified: Secondary | ICD-10-CM | POA: Diagnosis not present

## 2022-01-01 DIAGNOSIS — M6281 Muscle weakness (generalized): Secondary | ICD-10-CM | POA: Diagnosis not present

## 2022-01-02 DIAGNOSIS — M6281 Muscle weakness (generalized): Secondary | ICD-10-CM | POA: Diagnosis not present

## 2022-01-02 DIAGNOSIS — D631 Anemia in chronic kidney disease: Secondary | ICD-10-CM | POA: Diagnosis not present

## 2022-01-02 DIAGNOSIS — N2581 Secondary hyperparathyroidism of renal origin: Secondary | ICD-10-CM | POA: Diagnosis not present

## 2022-01-02 DIAGNOSIS — I120 Hypertensive chronic kidney disease with stage 5 chronic kidney disease or end stage renal disease: Secondary | ICD-10-CM | POA: Diagnosis not present

## 2022-01-02 DIAGNOSIS — R262 Difficulty in walking, not elsewhere classified: Secondary | ICD-10-CM | POA: Diagnosis not present

## 2022-01-02 DIAGNOSIS — Z992 Dependence on renal dialysis: Secondary | ICD-10-CM | POA: Diagnosis not present

## 2022-01-02 DIAGNOSIS — D509 Iron deficiency anemia, unspecified: Secondary | ICD-10-CM | POA: Diagnosis not present

## 2022-01-02 DIAGNOSIS — Z9181 History of falling: Secondary | ICD-10-CM | POA: Diagnosis not present

## 2022-01-02 DIAGNOSIS — N186 End stage renal disease: Secondary | ICD-10-CM | POA: Diagnosis not present

## 2022-01-03 DIAGNOSIS — R262 Difficulty in walking, not elsewhere classified: Secondary | ICD-10-CM | POA: Diagnosis not present

## 2022-01-03 DIAGNOSIS — M6281 Muscle weakness (generalized): Secondary | ICD-10-CM | POA: Diagnosis not present

## 2022-01-03 DIAGNOSIS — I120 Hypertensive chronic kidney disease with stage 5 chronic kidney disease or end stage renal disease: Secondary | ICD-10-CM | POA: Diagnosis not present

## 2022-01-03 DIAGNOSIS — Z9181 History of falling: Secondary | ICD-10-CM | POA: Diagnosis not present

## 2022-01-04 DIAGNOSIS — D631 Anemia in chronic kidney disease: Secondary | ICD-10-CM | POA: Diagnosis not present

## 2022-01-04 DIAGNOSIS — Z992 Dependence on renal dialysis: Secondary | ICD-10-CM | POA: Diagnosis not present

## 2022-01-04 DIAGNOSIS — N2581 Secondary hyperparathyroidism of renal origin: Secondary | ICD-10-CM | POA: Diagnosis not present

## 2022-01-04 DIAGNOSIS — N186 End stage renal disease: Secondary | ICD-10-CM | POA: Diagnosis not present

## 2022-01-04 DIAGNOSIS — D509 Iron deficiency anemia, unspecified: Secondary | ICD-10-CM | POA: Diagnosis not present

## 2022-01-06 DIAGNOSIS — R262 Difficulty in walking, not elsewhere classified: Secondary | ICD-10-CM | POA: Diagnosis not present

## 2022-01-06 DIAGNOSIS — I120 Hypertensive chronic kidney disease with stage 5 chronic kidney disease or end stage renal disease: Secondary | ICD-10-CM | POA: Diagnosis not present

## 2022-01-06 DIAGNOSIS — M6281 Muscle weakness (generalized): Secondary | ICD-10-CM | POA: Diagnosis not present

## 2022-01-06 DIAGNOSIS — Z9181 History of falling: Secondary | ICD-10-CM | POA: Diagnosis not present

## 2022-01-07 DIAGNOSIS — R262 Difficulty in walking, not elsewhere classified: Secondary | ICD-10-CM | POA: Diagnosis not present

## 2022-01-07 DIAGNOSIS — D631 Anemia in chronic kidney disease: Secondary | ICD-10-CM | POA: Diagnosis not present

## 2022-01-07 DIAGNOSIS — M6281 Muscle weakness (generalized): Secondary | ICD-10-CM | POA: Diagnosis not present

## 2022-01-07 DIAGNOSIS — Z992 Dependence on renal dialysis: Secondary | ICD-10-CM | POA: Diagnosis not present

## 2022-01-07 DIAGNOSIS — Z9181 History of falling: Secondary | ICD-10-CM | POA: Diagnosis not present

## 2022-01-07 DIAGNOSIS — D509 Iron deficiency anemia, unspecified: Secondary | ICD-10-CM | POA: Diagnosis not present

## 2022-01-07 DIAGNOSIS — N2581 Secondary hyperparathyroidism of renal origin: Secondary | ICD-10-CM | POA: Diagnosis not present

## 2022-01-07 DIAGNOSIS — N186 End stage renal disease: Secondary | ICD-10-CM | POA: Diagnosis not present

## 2022-01-07 DIAGNOSIS — I120 Hypertensive chronic kidney disease with stage 5 chronic kidney disease or end stage renal disease: Secondary | ICD-10-CM | POA: Diagnosis not present

## 2022-01-09 ENCOUNTER — Encounter: Payer: Self-pay | Admitting: Adult Health

## 2022-01-09 ENCOUNTER — Non-Acute Institutional Stay (SKILLED_NURSING_FACILITY): Payer: Medicare Other | Admitting: Adult Health

## 2022-01-09 DIAGNOSIS — Z992 Dependence on renal dialysis: Secondary | ICD-10-CM | POA: Diagnosis not present

## 2022-01-09 DIAGNOSIS — Z0000 Encounter for general adult medical examination without abnormal findings: Secondary | ICD-10-CM

## 2022-01-09 DIAGNOSIS — N186 End stage renal disease: Secondary | ICD-10-CM | POA: Diagnosis not present

## 2022-01-09 DIAGNOSIS — N2581 Secondary hyperparathyroidism of renal origin: Secondary | ICD-10-CM | POA: Diagnosis not present

## 2022-01-09 DIAGNOSIS — D509 Iron deficiency anemia, unspecified: Secondary | ICD-10-CM | POA: Diagnosis not present

## 2022-01-09 DIAGNOSIS — D631 Anemia in chronic kidney disease: Secondary | ICD-10-CM | POA: Diagnosis not present

## 2022-01-09 NOTE — Progress Notes (Signed)
Subjective:   Zachary [name] is a 76 y.o. male who presents for Medicare Annual/Subsequent preventive examination.  Review of Systems    Review of Systems  Constitutional:  Negative for malaise/fatigue.  Respiratory:  Negative for cough and shortness of breath.   Cardiovascular:  Negative for chest pain, palpitations and leg swelling.  Gastrointestinal:  Negative for abdominal pain, constipation and heartburn.  Musculoskeletal:  Negative for back pain, joint pain and myalgias.  Skin: Negative.   Neurological:  Negative for dizziness.  Psychiatric/Behavioral:  The patient is not nervous/anxious.    Cardiac Risk Factors include: advanced age (>16men, >34 women);diabetes mellitus;dyslipidemia;hypertension;male gender     Objective:    Today's Vitals   01/09/22 1026 01/09/22 1322  BP: (!) 123/52   Pulse: 89   Temp: 98.6 F (37 C)   SpO2: 99%   Weight: 214 lb (97.1 kg)   Height: 5\' 8"  (1.727 m)   PainSc:  0-No pain   Body mass index is 32.54 kg/m.  Advanced Directives 01/09/2022 12/20/2021 12/06/2021 12/02/2021 11/11/2021 10/07/2021 09/26/2021  Does Patient Have a Medical Advance Directive? No No No No No No No  Type of Advance Directive - - - - - - -  Does patient want to make changes to medical advance directive? No - Patient declined No - Patient declined No - Patient declined - No - Patient declined No - Patient declined No - Patient declined  Copy of Healthcare Power of Attorney in Chart? - - - - - - -  Would patient like information on creating a medical advance directive? - - - - - - -  Pre-existing out of facility DNR order (yellow form or pink MOST form) - - - - - - -    Current Medications (verified) Outpatient Encounter Medications as of 01/09/2022  Medication Sig   aspirin EC 81 MG tablet Take 81 mg by mouth daily. 0900   atorvastatin (LIPITOR) 20 MG tablet Take 20 mg by mouth at bedtime. 2000   Insulin Glargine (BASAGLAR KWIKPEN) 100 UNIT/ML SOPN Inject 20 Units into  the skin at bedtime. 2100 DO NOT HOLD this insulin without provider notification   levothyroxine (SYNTHROID) 125 MCG tablet Take 125 mcg by mouth at bedtime. 2100   midodrine (PROAMATINE) 10 MG tablet Take 10 mg by mouth See admin instructions. Send med with resident on  Tues/Thurs/Sat to dialysis. do not give at [facility], give to resident to take with him. send with 5mg  to equal 15mg . 6:00   midodrine (PROAMATINE) 5 MG tablet Take 5 mg by mouth See admin instructions. Send with resident on dialysis days.  Tues/Thurs/Sat  Take along with 10 mg to equal 15 mg 6:00   NON FORMULARY Diet: NAS, Cons CHO   omega-3 acid ethyl esters (LOVAZA) 1 g capsule Take 2 g by mouth at bedtime. 2100   omeprazole (PRILOSEC) 40 MG capsule Take 40 mg by mouth daily. 0900   polyethylene glycol (MIRALAX / GLYCOLAX) packet Take 17 g by mouth daily as needed for mild constipation.    sevelamer carbonate (RENVELA) 800 MG tablet Take 2,400 mg by mouth 3 (three) times daily with meals. 0900, 1200, 1700   tamsulosin (FLOMAX) 0.4 MG CAPS capsule Take 0.4 mg by mouth daily at 6 PM. Give 30 minutes after a meal. Do not crush or chew   torsemide (DEMADEX) 20 MG tablet Take 20 mg by mouth as directed. Sunday, Monday, Wednesday, and Friday   vitamin B-12 (CYANOCOBALAMIN) 1000 MCG  tablet Take 1,000 mcg by mouth daily.   No facility-administered encounter medications on file as of 01/09/2022.    Allergies (verified) Patient has no known allergies.   History: Past Medical History:  Diagnosis Date   Abnormal CT scan, kidney 10/06/2011   Acute pyelonephritis 10/07/2011   Anemia    normocytic   Anxiety    mental retardation   Bladder wall thickening 10/06/2011   BPH (benign prostatic hypertrophy)    Diabetes mellitus    Dialysis patient (HCC)    Tuesday, Thursday and Saturday,    DVT of leg (deep venous thrombosis) (HCC) 12/25/2016   Edema     history of lower extremity edema   GERD (gastroesophageal reflux disease)     Heme positive stool    Hydronephrosis    Hyperkalemia    Hyperlipidemia    Hypernatremia    Hypertension    Hypothyroidism    Impaired speech    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HCC)    MR (mental retardation)    Muscle weakness    Obstructive uropathy    Perinephric abscess 10/07/2011   Poor historian poor historian   Primary colorectal adenocarcinoma (HCC) 04/2017   S/p total colectomy   Protein calorie malnutrition (HCC)    Pyelonephritis    Renal failure (ARF), acute on chronic (HCC)    Renal insufficiency    chronic history   Sepsis (HCC)    Smoking    Uremia    Urinary retention    UTI (lower urinary tract infection) 10/06/2011   Past Surgical History:  Procedure Laterality Date   A/V FISTULAGRAM N/A 08/13/2018   Procedure: A/V FISTULAGRAM - Right Upper;  Surgeon: Sherren Kerns, MD;  [facility]: [facility];  Service: Cardiovascular;  Laterality: N/A;   A/V FISTULAGRAM N/A 11/22/2018   Procedure: A/V FISTULAGRAM - Right Upper;  Surgeon: Maeola Harman, MD;  Location: [facility] INVASIVE CV LAB;  Service: Cardiovascular;  Laterality: N/A;   AV FISTULA PLACEMENT Left 07/06/2015   Procedure:  INSERTION LEFT ARM ARTERIOVENOUS GORTEX GRAFT;  Surgeon: Chuck Hint, MD;  Location: [facility] OR;  Service: Vascular;  Laterality: Left;   AV FISTULA PLACEMENT Right 02/26/2016   Procedure: ARTERIOVENOUS (AV) FISTULA CREATION ;  Surgeon: Chuck Hint, MD;  Location: [facility] OR;  Service: Vascular;  Laterality: Right;   AV FISTULA PLACEMENT Right 11/25/2018   Procedure: INSERTION OF ARTERIOVENOUS (AV) ARTEGRAFT RIGHT UPPER ARM;  Surgeon: Maeola Harman, MD;  Location: [facility] OR;  Service: Vascular;  Laterality: Right;   AV FISTULA PLACEMENT Left 05/28/2021   Procedure: LEFT ARM ARTERIOVENOUS (AV) FISTULA;  Surgeon: Larina Earthly, MD;  Location: [facility] ORS;  Service: Vascular;  Laterality: Left;   AVGG REMOVAL Left 10/09/2015   Procedure: REMOVAL OF  ARTERIOVENOUS GORETEX GRAFT (AVGG) Evacuation of Lymphocele, Vein Patch angioplasty of brachial artery.;  Surgeon: Chuck Hint, MD;  Location: [facility] OR;  Service: Vascular;  Laterality: Left;   BASCILIC VEIN TRANSPOSITION Right 02/26/2016   Procedure: Right BASCILIC VEIN TRANSPOSITION;  Surgeon: Chuck Hint, MD;  Location: [facility] OR;  Service: Vascular;  Laterality: Right;   BASCILIC VEIN TRANSPOSITION Left 07/30/2021   Procedure: LEFT ARM SECOND STAGE BASILIC VEIN TRANSPOSITION;  Surgeon: Larina Earthly, MD;  Location: [facility];  Service: Vascular;  Laterality: Left;   CIRCUMCISION N/A 01/04/2014   Procedure: CIRCUMCISION ADULT (procedure #1);  Surgeon: Ky Barban, MD;  Location: [facility];  Service: Urology;  Laterality: N/A;   COLECTOMY  N/A 05/04/2017   Procedure: TOTAL COLECTOMY;  Surgeon: Franky Macho, MD;  Location: [facility];  Service: General;  Laterality: N/A;   COLONOSCOPY N/A 04/27/2017   Surgeon: Corbin Ade, MD; annular mass in the ascending colon likely representing cancer biopsied, multiple 6-22 mm polyps removed, clean rectum.  Pathology with multiple tubular adenomas, high-grade dysplasia noted in ascending colon and splenic flexure.   CYSTOSCOPY W/ RETROGRADES Bilateral 06/29/2015   Procedure: CYSTOSCOPY, DILATION OF URETHRAL STRICTURE WITH BILATERAL RETROGRADE PYELOGRAM,SUPRAPUBIC TUBE CHANGE;  Surgeon: Jerilee Field, MD;  Location: [facility] ORS;  Service: Urology;  Laterality: Bilateral;   CYSTOSCOPY WITH URETHRAL DILATATION N/A 12/29/2013   Procedure: CYSTOSCOPY WITH URETHRAL DILATATION;  Surgeon: Ky Barban, MD;  Location: [facility];  Service: Urology;  Laterality: N/A;   ESOPHAGOGASTRODUODENOSCOPY N/A 04/27/2017   Procedure: ESOPHAGOGASTRODUODENOSCOPY (EGD);  Surgeon: Corbin Ade, MD;  [facility];  Service: Endoscopy;  Laterality: N/A;   FLEXIBLE SIGMOIDOSCOPY N/A 12/02/2021   Procedure: FLEXIBLE SIGMOIDOSCOPY;  Surgeon: Lanelle Bal, DO;   [facility];  Service: Endoscopy;  Laterality: N/A;  10:30am, dialysis patient   INSERTION OF DIALYSIS CATHETER Right 11/25/2018   Procedure: INSERTION OF DIALYSIS CATHETER RIGHT INTERNAL JUGULAR;  Surgeon: Maeola Harman, MD;  Location: [facility] OR;  Service: Vascular;  Laterality: Right;   IR AV DIALY SHUNT INTRO NEEDLE/INTRACATH INITIAL W/PTA/IMG RIGHT Right 09/07/2018   IR FLUORO GUIDE CV LINE RIGHT  10/16/2020   IR REMOVAL TUN CV CATH W/O FL  01/12/2019   IR THROMBECTOMY AV FISTULA W/THROMBOLYSIS/PTA INC/SHUNT/IMG RIGHT Right 04/26/2018   IR US GUIDE VASC ACCESS RIGHT  04/26/2018   IR US GUIDE VASC ACCESS RIGHT  09/07/2018   IR US GUIDE VASC ACCESS RIGHT  10/16/2020   IR US GUIDE VASC ACCESS RIGHT  10/16/2020   ORIF FEMUR FRACTURE Right 11/22/2016   Procedure: OPEN REDUCTION INTERNAL FIXATION (ORIF) DISTAL FEMUR FRACTURE;  Surgeon: Samson Frederic, MD;  Location: [facility] OR;  Service: Orthopedics;  Laterality: Right;   PATCH ANGIOPLASTY Right 12/10/2017   Procedure: PATCH ANGIOPLASTY;  Surgeon: Chuck Hint, MD;  Location: [facility] OR;  Service: Vascular;  Laterality: Right;   PERIPHERAL VASCULAR BALLOON ANGIOPLASTY  08/13/2018   Procedure: PERIPHERAL VASCULAR BALLOON ANGIOPLASTY;  Surgeon: Sherren Kerns, MD;  [facility]: [facility];  Service: Cardiovascular;;  right AV fistula    PERIPHERAL VASCULAR BALLOON ANGIOPLASTY  11/22/2018   Procedure: PERIPHERAL VASCULAR BALLOON ANGIOPLASTY;  Surgeon: Maeola Harman, MD;  Location: [facility] INVASIVE CV LAB;  Service: Cardiovascular;;  rt AV fistula   PERIPHERAL VASCULAR CATHETERIZATION N/A 10/08/2015   Procedure: A/V Shuntogram;  Surgeon: Chuck Hint, MD;  Location: [facility] INVASIVE CV LAB;  Service: Cardiovascular;  Laterality: N/A;   REMOVAL OF A DIALYSIS CATHETER N/A 12/03/2021   Procedure: MINOR REMOVAL OF A TUNNELED DIALYSIS CATHETER;  Surgeon: Larina Earthly, MD;  Location: [facility];  Service: Vascular;   Laterality: N/A;   THROMBECTOMY W/ EMBOLECTOMY Right 12/10/2017   Procedure: THROMBECTOMY REVISION RIGHT ARM  ARTERIOVENOUS FISTULA;  Surgeon: Chuck Hint, MD;  Location: [facility] OR;  Service: Vascular;  Laterality: Right;   TRANSURETHRAL RESECTION OF PROSTATE N/A 01/04/2014   Procedure: TRANSURETHRAL RESECTION OF THE PROSTATE (TURP) (procedure #2);  Surgeon: Ky Barban, MD;  Location: [facility];  Service: Urology;  Laterality: N/A;   Family History  Problem Relation Age of Onset   Cancer Mother    Colon cancer Neg Hx    Social  History   Socioeconomic History   Marital status: Single    Spouse name: Not on file   Number of children: Not on file   Years of education: Not on file   Highest education level: Not on file  Occupational History   Occupation: retired   Tobacco Use   Smoking status: Never   Smokeless tobacco: Never  Vaping Use   Vaping Use: Never used  Substance and Sexual Activity   Alcohol use: No   Drug use: No   Sexual activity: Not Currently  Other Topics Concern   Not on file  Social History Narrative   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   [facility] resident of [facility]    Social Determinants of Health   Financial Resource Strain: Not on file  Food Insecurity: Not on file  Transportation Needs: Not on file  Physical Activity: Not on file  Stress: Not on file  Social Connections: Not on file    Tobacco Counseling Counseling given: Not Answered   Clinical Intake:  Pre-visit preparation completed: Yes  Pain : No/denies pain Pain Score: 0-No pain     BMI - recorded: 32.54 Nutritional Status: BMI > 30  Obese Nutritional Risks: Unintentional weight gain Diabetes: Yes CBG done?: Yes CBG resulted in Enter/ Edit results?:  (per nursing staff) Did pt. bring in CBG monitor from home?: No  How often do you need to have someone help you when you read instructions, pamphlets, or other written materials  from your doctor or pharmacy?: 4 - Often  Diabetic?yes  Interpreter Needed?: No      Activities of Daily Living In your present state of health, do you have any difficulty performing the following activities: 01/09/2022 07/26/2021  Hearing? N N  Vision? N N  Difficulty concentrating or making decisions? Malvin Johns  Walking or climbing stairs? Y Y  Dressing or bathing? Y Y  Doing errands, shopping? Malvin Johns  Preparing Food and eating ? Y -  Using the Toilet? Y -  In the past six months, have you accidently leaked urine? Y -  Do you have problems with loss of bowel control? Y -  Managing your Medications? Y -  Managing your Finances? Y -  Housekeeping or managing your Housekeeping? Y -  Some recent data might be hidden    Patient Care Team: Sharee Holster, NP as PCP - General (Geriatric Medicine) Salomon Mast, MD (Inactive) as Consulting Physician (Nephrology) [facility] Dialysis, [facility], Hennie Duos, DO as Consulting Physician (Gastroenterology)  Indicate any recent Medical Services you may have received from other than [facility] providers in the past year (date may be approximate).     Assessment:   This is a routine wellness examination for [place].  Hearing/Vision screen No results found.  Dietary issues and exercise activities discussed: Current Exercise Habits: The patient does not participate in regular exercise at present, Exercise limited by: None identified   Goals Addressed             This Visit's Progress    Absence of Fall and Fall-Related Injury   On track    Evidence-based guidance:  Assess fall risk using a validated tool when available. Consider balance and gait impairment, muscle weakness, diminished vision or hearing, environmental hazards, presence of urinary or bowel urgency and/or incontinence.  Communicate fall injury risk to interprofessional healthcare team.  Develop a fall prevention plan with  the patient and family.  Promote use of personal vision and auditory aids.  Promote reorientation, appropriate sensory stimulation, and routines to decrease risk of fall when changes in mental status are present.  Assess assistance level required for safe and effective self-care; consider referral for home care.  Encourage physical activity, such as performance of self-care at highest level of ability, strength and balance exercise program, and provision of appropriate assistive devices; refer to rehabilitation therapy.  Refer to community-based fall prevention program where available.  If fall occurs, determine the cause and revise fall injury prevention plan.  Regularly review medication contribution to fall risk; consider risk related to polypharmacy and age.  Refer to pharmacist for consultation when concerns about medications are revealed.  Balance adequate pain management with potential for oversedation.  Provide guidance related to environmental modifications.  Consider supplementation with Vitamin D.   Notes:      Follow up with Provider as scheduled   On track    General - Client will not be readmitted within 30 days ([facility])   On track      Depression Screen PHQ 2/9 Scores 01/09/2022 11/13/2021 10/10/2021 01/08/2021 01/19/2020 01/09/2020 09/15/2019  PHQ - 2 Score 0 0 0 0 0 0 0  PHQ- 9 Score - 0 0 - - - -    Fall Risk Fall Risk  01/09/2022 11/26/2021 11/13/2021 01/08/2021 01/19/2020  Falls in the past year? 0 0 0 0 0  Comment - - - - -  Number falls in past yr: 0 0 0 0 -  Comment - - - - -  Injury with Fall? 0 0 0 0 -  Risk for fall due to : Impaired balance/gait;Impaired mobility Impaired balance/gait;Impaired mobility - Impaired balance/gait Impaired balance/gait;Impaired mobility    FALL RISK PREVENTION PERTAINING TO THE HOME:  Any stairs in or around the home? Yes  If so, are there any without handrails? No  Home free of loose throw rugs in walkways, pet beds, electrical  cords, etc? Yes  Adequate lighting in your home to reduce risk of falls? Yes   ASSISTIVE DEVICES UTILIZED TO PREVENT FALLS:  Life alert? No  Use of a cane, walker or w/c? Yes  Grab bars in the bathroom? Yes  Shower chair or bench in shower? Yes  Elevated toilet seat or a handicapped toilet? Yes   TIMED UP AND GO:  Was the test performed? No .  Length of time to ambulate n/a    Cognitive Function: MMSE - Mini Mental State Exam 01/09/2022 01/08/2021  Not completed: Unable to complete Refused     6CIT Screen 01/08/2021 02/18/2018  What Year? 0 points 4 points  What month? 0 points 3 points  What time? 0 points 3 points  Count back from 20 2 points 4 points  Months in reverse 4 points 4 points  Repeat phrase 4 points 10 points  Total Score 10 28    Immunizations Immunization History  Administered Date(s) Administered   Hepatitis B, adult 09/30/2016, 10/28/2016, 12/02/2016, 03/28/2017, 07/02/2017, 08/13/2017, 09/12/2017, 01/14/2018   Influenza,inj,Quad PF,6+ Mos 12/26/2013, 07/09/2015, 07/31/2021   Influenza,inj,Quad PF,6-35 Mos 08/08/2019   Influenza-Unspecified 07/28/2016, 07/31/2017, 07/29/2018, 08/08/2019, 07/20/2020, 08/03/2020   Moderna Covid-19 Vaccine Bivalent Booster 55yrs & up 08/20/2021   Moderna SARS-COV2 Booster Vaccination 02/06/2021   Moderna Sars-Covid-2 Vaccination 11/02/2019, 11/30/2019, 08/30/2020   PNEUMOCOCCAL CONJUGATE-20 08/02/2021   PPD Test 03/07/2015, 08/08/2016, 05/15/2017, 08/31/2018, 10/10/2019, 10/10/2020, 09/12/2021   Pneumococcal Conjugate-13 12/03/2016   Pneumococcal Polysaccharide-23 03/04/2015, 07/31/2017   Pneumococcal-Unspecified 06/12/2015  Tdap 08/22/2017   Unspecified SARS-COV-2 Vaccination 11/02/2019   Zoster Recombinat (Shingrix) 05/30/2015, 09/27/2021    TDAP status: Up to date  Flu Vaccine status: Up to date  Pneumococcal vaccine status: Up to date  Covid-19 vaccine status: Completed vaccines  Qualifies for Shingles  Vaccine? Yes   Zostavax completed Yes   Shingrix Completed?: Yes  Screening Tests Health Maintenance  Topic Date Due   HEMOGLOBIN A1C  05/07/2022   OPHTHALMOLOGY EXAM  10/02/2022   FOOT EXAM  11/11/2022   COLONOSCOPY (Pts 45-41yrs Insurance coverage will need to be confirmed)  04/28/2027   TETANUS/TDAP  08/23/2027   Pneumonia Vaccine 84+ Years old  Completed   INFLUENZA VACCINE  Completed   COVID-19 Vaccine  Completed   Hepatitis C Screening  Completed   Zoster Vaccines- Shingrix  Completed   HPV VACCINES  Aged Out   URINE MICROALBUMIN  Discontinued    Health Maintenance  There are no preventive care reminders to display for this patient.   Lung Cancer Screening: (Low Dose CT Chest recommended if Age 60-80 years, 30 pack-year currently smoking OR have quit w/in 15years.) does not qualify.   Lung Cancer Screening Referral: n/a   Additional Screening:  Hepatitis C Screening: does qualify; Completed   Vision Screening: Recommended annual ophthalmology exams for early detection of glaucoma and other disorders of the eye. Is the patient up to date with their annual eye exam?  Yes  Who is the provider or what is the name of the office in which the patient attends annual eye exams?  If pt is not established with a provider, would they like to be referred to a provider to establish care? Yes .   Dental Screening: Recommended annual dental exams for proper oral hygiene  Community Resource Referral / Chronic Care Management: CRR required this visit?  No   CCM required this visit?  No      Plan:     I have personally reviewed and noted the following in the patients chart:   Medical and social history Use of alcohol, tobacco or illicit drugs  Current medications and supplements including opioid prescriptions. Patient is not currently taking opioid prescriptions. Functional ability and status Nutritional status Physical activity Advanced directives List of other  physicians Hospitalizations, surgeries, and ER visits in previous 12 months Vitals Screenings to include cognitive, depression, and falls Referrals and appointments  In addition, I have reviewed and discussed with patient certain preventive protocols, quality metrics, and best practice recommendations. A written personalized care plan for preventive services as well as general preventive health recommendations were provided to patient.     Sharee Holster, NP   01/09/2022   Nurse Notes:

## 2022-01-11 DIAGNOSIS — D631 Anemia in chronic kidney disease: Secondary | ICD-10-CM | POA: Diagnosis not present

## 2022-01-11 DIAGNOSIS — D509 Iron deficiency anemia, unspecified: Secondary | ICD-10-CM | POA: Diagnosis not present

## 2022-01-11 DIAGNOSIS — Z992 Dependence on renal dialysis: Secondary | ICD-10-CM | POA: Diagnosis not present

## 2022-01-11 DIAGNOSIS — N2581 Secondary hyperparathyroidism of renal origin: Secondary | ICD-10-CM | POA: Diagnosis not present

## 2022-01-11 DIAGNOSIS — N186 End stage renal disease: Secondary | ICD-10-CM | POA: Diagnosis not present

## 2022-01-14 ENCOUNTER — Encounter: Payer: Self-pay | Admitting: Adult Health

## 2022-01-14 ENCOUNTER — Non-Acute Institutional Stay (SKILLED_NURSING_FACILITY): Payer: Medicare Other | Admitting: Adult Health

## 2022-01-14 DIAGNOSIS — E1122 Type 2 diabetes mellitus with diabetic chronic kidney disease: Secondary | ICD-10-CM

## 2022-01-14 DIAGNOSIS — N186 End stage renal disease: Secondary | ICD-10-CM

## 2022-01-14 DIAGNOSIS — D631 Anemia in chronic kidney disease: Secondary | ICD-10-CM | POA: Diagnosis not present

## 2022-01-14 DIAGNOSIS — D509 Iron deficiency anemia, unspecified: Secondary | ICD-10-CM | POA: Diagnosis not present

## 2022-01-14 DIAGNOSIS — E034 Atrophy of thyroid (acquired): Secondary | ICD-10-CM | POA: Diagnosis not present

## 2022-01-14 DIAGNOSIS — N2581 Secondary hyperparathyroidism of renal origin: Secondary | ICD-10-CM | POA: Diagnosis not present

## 2022-01-14 DIAGNOSIS — F321 Major depressive disorder, single episode, moderate: Secondary | ICD-10-CM

## 2022-01-14 DIAGNOSIS — Z992 Dependence on renal dialysis: Secondary | ICD-10-CM

## 2022-01-14 NOTE — Progress Notes (Signed)
? ?Location:  [place] ?Nursing Home Room Number: 628 ?Place of Service:  SNF (31) ? ? ?CODE STATUS: full  ? ?No Known Allergies ? ?Chief Complaint  ?Patient presents with  ? Medical Management of Chronic Issues ? ?             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 ? ?HPI: ? 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 He does get out of bed daily. Self propels wheelchair. No reports of anxiety or depressive thoughts.  ? ?Past Medical History:  ?Diagnosis Date  ? Abnormal CT scan, kidney 10/06/2011  ? Acute pyelonephritis 10/07/2011  ? Anemia   ? normocytic  ? Anxiety   ? mental retardation  ? Bladder wall thickening 10/06/2011  ? BPH (benign prostatic hypertrophy)   ? Diabetes mellitus   ? Dialysis patient [facility])   ? Tuesday, Thursday and Saturday,   ? DVT of leg (deep venous thrombosis) ([place]) 12/25/2016  ? Edema   ?  history of lower extremity edema  ? GERD (gastroesophageal reflux disease)   ? Heme positive stool   ? Hydronephrosis   ? Hyperkalemia   ? Hyperlipidemia   ? Hypernatremia   ? Hypertension   ? Hypothyroidism   ? Impaired speech   ? 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 MR (mental retardation)   ? Muscle weakness   ? Obstructive uropathy   ? Perinephric abscess 10/07/2011  ? Poor historian poor historian  ? Primary colorectal adenocarcinoma ([place]) 04/2017  ? S/p total colectomy  ? Protein calorie malnutrition ([place])   ? Pyelonephritis   ? Renal failure (ARF), acute on  chronic (HCC)   ? Renal insufficiency   ? chronic history  ? Sepsis ([place])   ? Smoking   ? Uremia   ? Urinary retention   ? UTI (lower urinary tract infection) 10/06/2011  ? ? ?Past Surgical History:  ?Procedure Laterality Date  ? A/V FISTULAGRAM N/A 08/13/2018  ? Procedure: A/V FISTULAGRAM - Right Upper;  Surgeon: Elam Dutch, MD;  Location: [place] CV LAB;  Service: Cardiovascular;  Laterality: N/A;  ? A/V FISTULAGRAM N/A 11/22/2018  ? Procedure: A/V FISTULAGRAM - Right Upper;  Surgeon: Waynetta Sandy, MD;  Location: [place] CV LAB;  Service: Cardiovascular;  Laterality: N/A;  ? AV FISTULA PLACEMENT Left 07/06/2015  ? Procedure:  INSERTION LEFT ARM ARTERIOVENOUS GORTEX GRAFT;  Surgeon: Angelia Mould, MD;  Location: [place];  Service: Vascular;  Laterality: Left;  ? AV FISTULA PLACEMENT Right 02/26/2016  ? Procedure: ARTERIOVENOUS (AV) FISTULA CREATION ;  Surgeon: Angelia Mould, MD;  Location: [place];  Service: Vascular;  Laterality: Right;  ? AV FISTULA PLACEMENT Right 11/25/2018  ? Procedure: INSERTION OF ARTERIOVENOUS (AV) ARTEGRAFT RIGHT UPPER ARM;  Surgeon: Waynetta Sandy, MD;  Location: [place];  Service: Vascular;  Laterality: Right;  ? AV FISTULA PLACEMENT Left 05/28/2021  ? Procedure: LEFT ARM ARTERIOVENOUS (AV) FISTULA;  Surgeon: Rosetta Posner, MD;  Location: [facility];  Service: Vascular;  Laterality: Left;  ? [name] Left 10/09/2015  ? Procedure: REMOVAL OF ARTERIOVENOUS GORETEX GRAFT ([place]) Evacuation of Lymphocele, Vein Patch angioplasty of brachial artery.;  Surgeon: Angelia Mould, MD;  Location: [place];  Service: Vascular;  Laterality: Left;  ? BASCILIC VEIN TRANSPOSITION Right 02/26/2016  ? Procedure: Right BASCILIC VEIN TRANSPOSITION;  Surgeon: Angelia Mould, MD;  Location: [place];  Service: Vascular;  Laterality: Right;  ? BASCILIC VEIN TRANSPOSITION Left 07/30/2021  ? Procedure: LEFT ARM SECOND STAGE BASILIC VEIN TRANSPOSITION;   Surgeon: Rosetta Posner, MD;  Location: [facility];  Service: Vascular;  Laterality: Left;  ? CIRCUMCISION N/A 01/04/2014  ? Procedure: CIRCUMCISION ADULT (procedure #1);  Surgeon: Marissa Nestle, MD;  Location: [facility];  Service: Urology;  Laterality: N/A;  ? COLECTOMY N/A 05/04/2017  ? Procedure: TOTAL COLECTOMY;  Surgeon: Aviva Signs, MD;  Location: [facility];  Service: General;  Laterality: N/A;  ? COLONOSCOPY N/A 04/27/2017  ? Surgeon: Daneil Dolin, MD; annular mass in the ascending colon likely representing cancer biopsied, multiple 6-22 mm polyps removed, clean rectum.  Pathology with multiple tubular adenomas, high-grade dysplasia noted in ascending colon and splenic flexure.  ? CYSTOSCOPY W/ RETROGRADES Bilateral 06/29/2015  ? Procedure: CYSTOSCOPY, DILATION OF URETHRAL STRICTURE WITH BILATERAL RETROGRADE PYELOGRAM,SUPRAPUBIC TUBE CHANGE;  Surgeon: Festus Aloe, MD;  [facility];  Service: Urology;  Laterality: Bilateral;  ? CYSTOSCOPY WITH URETHRAL DILATATION N/A 12/29/2013  ? Procedure: CYSTOSCOPY WITH URETHRAL DILATATION;  Surgeon: Marissa Nestle, MD;  [facility]: [facility];  Service: Urology;  Laterality: N/A;  ? ESOPHAGOGASTRODUODENOSCOPY N/A 04/27/2017  ? Procedure: ESOPHAGOGASTRODUODENOSCOPY (EGD);  Surgeon: Daneil Dolin, MD;  [facility];  Service: Endoscopy;  Laterality: N/A;  ? FLEXIBLE SIGMOIDOSCOPY N/A 12/02/2021  ? Procedure: FLEXIBLE SIGMOIDOSCOPY;  Surgeon: Eloise Harman, DO;  [facility];  Service: Endoscopy;  Laterality: N/A;  10:30am, dialysis patient  ? INSERTION OF DIALYSIS CATHETER Right 11/25/2018  ? Procedure: INSERTION OF DIALYSIS CATHETER RIGHT INTERNAL JUGULAR;  Surgeon: Waynetta Sandy, MD;  Location: [place];  Service: Vascular;  Laterality: Right;  ? IR AV DIALY SHUNT INTRO [place] W/PTA/IMG RIGHT Right 09/07/2018  ? IR FLUORO GUIDE CV LINE RIGHT  10/16/2020  ? IR REMOVAL TUN CV CATH W/O FL  01/12/2019  ? IR  THROMBECTOMY AV FISTULA W/THROMBOLYSIS/PTA INC/SHUNT/IMG RIGHT Right 04/26/2018  ? IR US GUIDE VASC ACCESS RIGHT  04/26/2018  ? IR US GUIDE VASC ACCESS RIGHT  09/07/2018  ? IR US GUIDE VASC ACCESS RIGHT  10/16/2020  ? IR US GUIDE VASC ACCESS RIGHT  10/16/2020  ? ORIF FEMUR FRACTURE Right 11/22/2016  ? Procedure: OPEN REDUCTION INTERNAL FIXATION (ORIF) DISTAL FEMUR FRACTURE;  Surgeon: Rod Can, MD;  Location: [place];  Service: Orthopedics;  Laterality: Right;  ? PATCH ANGIOPLASTY Right 12/10/2017  ? Procedure: PATCH ANGIOPLASTY;  Surgeon: Angelia Mould, MD;  Location: [place];  Service: Vascular;  Laterality: Right;  ? PERIPHERAL VASCULAR BALLOON ANGIOPLASTY  08/13/2018  ? Procedure: PERIPHERAL VASCULAR BALLOON ANGIOPLASTY;  Surgeon: Elam Dutch, MD;  Location: [place] CV LAB;  Service: Cardiovascular;;  right AV fistula ?  ? PERIPHERAL VASCULAR BALLOON ANGIOPLASTY  11/22/2018  ? Procedure: PERIPHERAL VASCULAR BALLOON ANGIOPLASTY;  Surgeon: Waynetta Sandy, MD;  Location: [place] CV LAB;  Service: Cardiovascular;;  rt AV fistula  ? PERIPHERAL VASCULAR CATHETERIZATION N/A 10/08/2015  ? Procedure: A/V Shuntogram;  Surgeon: Angelia Mould, MD;  Location: [place] CV LAB;  Service: Cardiovascular;  Laterality: N/A;  ? REMOVAL OF A DIALYSIS CATHETER N/A 12/03/2021  ? Procedure: MINOR REMOVAL OF A TUNNELED DIALYSIS CATHETER;  Surgeon: Rosetta Posner, MD;  [facility];  Service: Vascular;  Laterality: N/A;  ? THROMBECTOMY W/ EMBOLECTOMY Right 12/10/2017  ? Procedure: THROMBECTOMY REVISION RIGHT ARM  ARTERIOVENOUS FISTULA;  Surgeon: Angelia Mould, MD;  Location: [facility] OR;  Service: Vascular;  Laterality: Right;  ? TRANSURETHRAL RESECTION OF PROSTATE N/A 01/04/2014  ? Procedure: TRANSURETHRAL RESECTION OF THE PROSTATE (TURP) (procedure #2);  Surgeon: Marissa Nestle, MD;  [facility];  Service: Urology;  Laterality: N/A;  ? ? ?Social History  ? ?Socioeconomic  History  ? Marital status: Single  ?  Spouse name: Not on file  ? Number of children: Not on file  ? Years of education: Not on file  ? Highest education level: Not on file  ?Occupational History  ? Occupation: retired   ?

## 2022-01-16 DIAGNOSIS — N2581 Secondary hyperparathyroidism of renal origin: Secondary | ICD-10-CM | POA: Diagnosis not present

## 2022-01-16 DIAGNOSIS — Z992 Dependence on renal dialysis: Secondary | ICD-10-CM | POA: Diagnosis not present

## 2022-01-16 DIAGNOSIS — D631 Anemia in chronic kidney disease: Secondary | ICD-10-CM | POA: Diagnosis not present

## 2022-01-16 DIAGNOSIS — D509 Iron deficiency anemia, unspecified: Secondary | ICD-10-CM | POA: Diagnosis not present

## 2022-01-16 DIAGNOSIS — N186 End stage renal disease: Secondary | ICD-10-CM | POA: Diagnosis not present

## 2022-01-18 DIAGNOSIS — D509 Iron deficiency anemia, unspecified: Secondary | ICD-10-CM | POA: Diagnosis not present

## 2022-01-18 DIAGNOSIS — N2581 Secondary hyperparathyroidism of renal origin: Secondary | ICD-10-CM | POA: Diagnosis not present

## 2022-01-18 DIAGNOSIS — N186 End stage renal disease: Secondary | ICD-10-CM | POA: Diagnosis not present

## 2022-01-18 DIAGNOSIS — D631 Anemia in chronic kidney disease: Secondary | ICD-10-CM | POA: Diagnosis not present

## 2022-01-18 DIAGNOSIS — Z992 Dependence on renal dialysis: Secondary | ICD-10-CM | POA: Diagnosis not present

## 2022-01-21 DIAGNOSIS — N186 End stage renal disease: Secondary | ICD-10-CM | POA: Diagnosis not present

## 2022-01-21 DIAGNOSIS — D631 Anemia in chronic kidney disease: Secondary | ICD-10-CM | POA: Diagnosis not present

## 2022-01-21 DIAGNOSIS — N2581 Secondary hyperparathyroidism of renal origin: Secondary | ICD-10-CM | POA: Diagnosis not present

## 2022-01-21 DIAGNOSIS — Z992 Dependence on renal dialysis: Secondary | ICD-10-CM | POA: Diagnosis not present

## 2022-01-21 DIAGNOSIS — D509 Iron deficiency anemia, unspecified: Secondary | ICD-10-CM | POA: Diagnosis not present

## 2022-01-22 DIAGNOSIS — F4322 Adjustment disorder with anxiety: Secondary | ICD-10-CM | POA: Diagnosis not present

## 2022-01-22 DIAGNOSIS — F70 Mild intellectual disabilities: Secondary | ICD-10-CM | POA: Diagnosis not present

## 2022-01-23 DIAGNOSIS — D631 Anemia in chronic kidney disease: Secondary | ICD-10-CM | POA: Diagnosis not present

## 2022-01-23 DIAGNOSIS — Z992 Dependence on renal dialysis: Secondary | ICD-10-CM | POA: Diagnosis not present

## 2022-01-23 DIAGNOSIS — N186 End stage renal disease: Secondary | ICD-10-CM | POA: Diagnosis not present

## 2022-01-23 DIAGNOSIS — N2581 Secondary hyperparathyroidism of renal origin: Secondary | ICD-10-CM | POA: Diagnosis not present

## 2022-01-23 DIAGNOSIS — D509 Iron deficiency anemia, unspecified: Secondary | ICD-10-CM | POA: Diagnosis not present

## 2022-01-24 DIAGNOSIS — Z992 Dependence on renal dialysis: Secondary | ICD-10-CM | POA: Diagnosis not present

## 2022-01-24 DIAGNOSIS — N186 End stage renal disease: Secondary | ICD-10-CM | POA: Diagnosis not present

## 2022-01-25 DIAGNOSIS — D631 Anemia in chronic kidney disease: Secondary | ICD-10-CM | POA: Diagnosis not present

## 2022-01-25 DIAGNOSIS — N2581 Secondary hyperparathyroidism of renal origin: Secondary | ICD-10-CM | POA: Diagnosis not present

## 2022-01-25 DIAGNOSIS — D509 Iron deficiency anemia, unspecified: Secondary | ICD-10-CM | POA: Diagnosis not present

## 2022-01-25 DIAGNOSIS — N250 Renal osteodystrophy: Secondary | ICD-10-CM | POA: Diagnosis not present

## 2022-01-25 DIAGNOSIS — Z992 Dependence on renal dialysis: Secondary | ICD-10-CM | POA: Diagnosis not present

## 2022-01-25 DIAGNOSIS — N186 End stage renal disease: Secondary | ICD-10-CM | POA: Diagnosis not present

## 2022-01-28 ENCOUNTER — Other Ambulatory Visit (HOSPITAL_COMMUNITY)
Admission: RE | Admit: 2022-01-28 | Discharge: 2022-01-28 | Disposition: A | Discharge status: Home / Self Care | Payer: Medicare Other | Source: Skilled Nursing Facility | Attending: Internal Medicine | Admitting: Internal Medicine

## 2022-01-28 ENCOUNTER — Non-Acute Institutional Stay (SKILLED_NURSING_FACILITY): Payer: Medicare Other | Admitting: Adult Health

## 2022-01-28 ENCOUNTER — Encounter: Payer: Self-pay | Admitting: Adult Health

## 2022-01-28 DIAGNOSIS — D631 Anemia in chronic kidney disease: Secondary | ICD-10-CM | POA: Diagnosis not present

## 2022-01-28 DIAGNOSIS — D509 Iron deficiency anemia, unspecified: Secondary | ICD-10-CM | POA: Diagnosis not present

## 2022-01-28 DIAGNOSIS — N2581 Secondary hyperparathyroidism of renal origin: Secondary | ICD-10-CM | POA: Diagnosis not present

## 2022-01-28 DIAGNOSIS — E119 Type 2 diabetes mellitus without complications: Secondary | ICD-10-CM | POA: Diagnosis not present

## 2022-01-28 DIAGNOSIS — U071 COVID-19: Secondary | ICD-10-CM

## 2022-01-28 DIAGNOSIS — Z992 Dependence on renal dialysis: Secondary | ICD-10-CM | POA: Diagnosis not present

## 2022-01-28 DIAGNOSIS — N250 Renal osteodystrophy: Secondary | ICD-10-CM | POA: Diagnosis not present

## 2022-01-28 DIAGNOSIS — N186 End stage renal disease: Secondary | ICD-10-CM | POA: Diagnosis not present

## 2022-01-28 LAB — CBC
HCT: 26 % — ABNORMAL LOW (ref 39.0–52.0)
Hemoglobin: 8.7 g/dL — ABNORMAL LOW (ref 13.0–17.0)
MCH: 35.2 pg — ABNORMAL HIGH (ref 26.0–34.0)
MCHC: 33.5 g/dL (ref 30.0–36.0)
MCV: 105.3 fL — ABNORMAL HIGH (ref 80.0–100.0)
Platelets: 113 10*3/uL — ABNORMAL LOW (ref 150–400)
RBC: 2.47 MIL/uL — ABNORMAL LOW (ref 4.22–5.81)
RDW: 13.6 % (ref 11.5–15.5)
WBC: 4.8 10*3/uL (ref 4.0–10.5)
nRBC: 0 % (ref 0.0–0.2)

## 2022-01-28 LAB — D-DIMER, QUANTITATIVE: D-Dimer, Quant: 0.43 ug/mL-FEU (ref 0.00–0.50)

## 2022-01-28 LAB — BASIC METABOLIC PANEL
Anion gap: 11 (ref 5–15)
BUN: 40 mg/dL — ABNORMAL HIGH (ref 8–23)
CO2: 25 mmol/L (ref 22–32)
Calcium: 8.8 mg/dL — ABNORMAL LOW (ref 8.9–10.3)
Chloride: 105 mmol/L (ref 98–111)
Creatinine, Ser: 11.35 mg/dL — ABNORMAL HIGH (ref 0.61–1.24)
GFR, Estimated: 4 mL/min — ABNORMAL LOW (ref >60)
Glucose, Bld: 71 mg/dL (ref 70–99)
Potassium: 3.8 mmol/L (ref 3.5–5.1)
Sodium: 141 mmol/L (ref 135–145)

## 2022-01-28 LAB — C-REACTIVE PROTEIN: CRP: <0.5 mg/dL (ref <1.0)

## 2022-01-28 NOTE — Progress Notes (Signed)
?Location:  [place] ?Nursing Home Room Number: 112-D ?Place of Service:  SNF (31) ? ? ?CODE STATUS: Full Code  ? ?No Known Allergies ? ?Chief Complaint  ?Patient presents with  ? Acute Visit  ?  Covid positive   ? ? ?HPI: ? ?He has tested positive for covid 19. There are no reports of fevers present. No cough; no shortness of breath; no body aches or pains.  ? ?Past Medical History:  ?Diagnosis Date  ? Abnormal CT scan, kidney 10/06/2011  ? Acute pyelonephritis 10/07/2011  ? Anemia   ? normocytic  ? Anxiety   ? mental retardation  ? Bladder wall thickening 10/06/2011  ? BPH (benign prostatic hypertrophy)   ? Diabetes mellitus   ? Dialysis patient [facility])   ? Tuesday, Thursday and Saturday,   ? DVT of leg (deep venous thrombosis) ([place]) 12/25/2016  ? Edema   ?  history of lower extremity edema  ? GERD (gastroesophageal reflux disease)   ? Heme positive stool   ? Hydronephrosis   ? Hyperkalemia   ? Hyperlipidemia   ? Hypernatremia   ? Hypertension   ? Hypothyroidism   ? Impaired speech   ? 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 MR (mental retardation)   ? Muscle weakness   ? Obstructive uropathy   ? Perinephric abscess 10/07/2011  ? Poor historian poor historian  ? Primary colorectal adenocarcinoma ([place]) 04/2017  ? S/p total colectomy  ? Protein calorie malnutrition ([place])   ? Pyelonephritis   ? Renal failure (ARF), acute on chronic (HCC)   ? Renal insufficiency   ? chronic history  ? Sepsis ([place])   ? Smoking   ? Uremia   ? Urinary retention   ? UTI (lower urinary tract infection) 10/06/2011  ? ? ?Past Surgical History:  ?Procedure Laterality Date  ? A/V FISTULAGRAM N/A 08/13/2018  ? Procedure: A/V FISTULAGRAM - Right Upper;  Surgeon: Elam Dutch, MD;  Location: [place] CV LAB;  Service: Cardiovascular;  Laterality: N/A;  ? A/V FISTULAGRAM N/A 11/22/2018  ? Procedure: A/V FISTULAGRAM - Right Upper;  Surgeon: Waynetta Sandy, MD;  Location: [place] CV LAB;  Service:  Cardiovascular;  Laterality: N/A;  ? AV FISTULA PLACEMENT Left 07/06/2015  ? Procedure:  INSERTION LEFT ARM ARTERIOVENOUS GORTEX GRAFT;  Surgeon: Angelia Mould, MD;  Location: [place];  Service: Vascular;  Laterality: Left;  ? AV FISTULA PLACEMENT Right 02/26/2016  ? Procedure: ARTERIOVENOUS (AV) FISTULA CREATION ;  Surgeon: Angelia Mould, MD;  Location: [place];  Service: Vascular;  Laterality: Right;  ? AV FISTULA PLACEMENT Right 11/25/2018  ? Procedure: INSERTION OF ARTERIOVENOUS (AV) ARTEGRAFT RIGHT UPPER ARM;  Surgeon: Waynetta Sandy, MD;  Location: [place];  Service: Vascular;  Laterality: Right;  ? AV FISTULA PLACEMENT Left 05/28/2021  ? Procedure: LEFT ARM ARTERIOVENOUS (AV) FISTULA;  Surgeon: Rosetta Posner, MD;  Location[facility];  Service: Vascular;  Laterality: Left;  ? [place] Left 10/09/2015  ? Procedure: REMOVAL OF ARTERIOVENOUS GORETEX GRAFT ([place]) Evacuation of Lymphocele, Vein Patch angioplasty of brachial artery.;  Surgeon: Angelia Mould, MD;  Location: [place];  Service: Vascular;  Laterality: Left;  ? BASCILIC VEIN TRANSPOSITION Right 02/26/2016  ? Procedure: Right BASCILIC VEIN TRANSPOSITION;  Surgeon: Angelia Mould, MD;  Location: [place];  Service: Vascular;  Laterality: Right;  ? BASCILIC VEIN TRANSPOSITION Left 07/30/2021  ? Procedure: LEFT ARM SECOND STAGE BASILIC VEIN TRANSPOSITION;  Surgeon:  Rosetta Posner, MD;  Location: [facility];  Service: Vascular;  Laterality: Left;  ? CIRCUMCISION N/A 01/04/2014  ? Procedure: CIRCUMCISION ADULT (procedure #1);  Surgeon: Marissa Nestle, MD;  Location: [facility];  Service: Urology;  Laterality: N/A;  ? COLECTOMY N/A 05/04/2017  ? Procedure: TOTAL COLECTOMY;  Surgeon: Aviva Signs, MD;  Location: [facility];  Service: General;  Laterality: N/A;  ? COLONOSCOPY N/A 04/27/2017  ? Surgeon: Daneil Dolin, MD; annular mass in the ascending colon likely representing cancer biopsied, multiple 6-22 mm polyps removed, clean  rectum.  Pathology with multiple tubular adenomas, high-grade dysplasia noted in ascending colon and splenic flexure.  ? CYSTOSCOPY W/ RETROGRADES Bilateral 06/29/2015  ? Procedure: CYSTOSCOPY, DILATION OF URETHRAL STRICTURE WITH BILATERAL RETROGRADE PYELOGRAM,SUPRAPUBIC TUBE CHANGE;  Surgeon: Festus Aloe, MD;  [facility];  Service: Urology;  Laterality: Bilateral;  ? CYSTOSCOPY WITH URETHRAL DILATATION N/A 12/29/2013  ? Procedure: CYSTOSCOPY WITH URETHRAL DILATATION;  Surgeon: Marissa Nestle, MD;  [facility];  Service: Urology;  Laterality: N/A;  ? ESOPHAGOGASTRODUODENOSCOPY N/A 04/27/2017  ? Procedure: ESOPHAGOGASTRODUODENOSCOPY (EGD);  Surgeon: Daneil Dolin, MD;  [facility];  Service: Endoscopy;  Laterality: N/A;  ? FLEXIBLE SIGMOIDOSCOPY N/A 12/02/2021  ? Procedure: FLEXIBLE SIGMOIDOSCOPY;  Surgeon: Eloise Harman, DO;  [facility];  Service: Endoscopy;  Laterality: N/A;  10:30am, dialysis patient  ? INSERTION OF DIALYSIS CATHETER Right 11/25/2018  ? Procedure: INSERTION OF DIALYSIS CATHETER RIGHT INTERNAL JUGULAR;  Surgeon: Waynetta Sandy, MD;  Location: [place];  Service: Vascular;  Laterality: Right;  ? IR AV DIALY SHUNT INTRO [place] W/PTA/IMG RIGHT Right 09/07/2018  ? IR FLUORO GUIDE CV LINE RIGHT  10/16/2020  ? IR REMOVAL TUN CV CATH W/O FL  01/12/2019  ? IR THROMBECTOMY AV FISTULA W/THROMBOLYSIS/PTA INC/SHUNT/IMG RIGHT Right 04/26/2018  ? IR US GUIDE VASC ACCESS RIGHT  04/26/2018  ? IR US GUIDE VASC ACCESS RIGHT  09/07/2018  ? IR US GUIDE VASC ACCESS RIGHT  10/16/2020  ? IR US GUIDE VASC ACCESS RIGHT  10/16/2020  ? ORIF FEMUR FRACTURE Right 11/22/2016  ? Procedure: OPEN REDUCTION INTERNAL FIXATION (ORIF) DISTAL FEMUR FRACTURE;  Surgeon: Rod Can, MD;  Location: [place];  Service: Orthopedics;  Laterality: Right;  ? PATCH ANGIOPLASTY Right 12/10/2017  ? Procedure: PATCH ANGIOPLASTY;  Surgeon: Angelia Mould, MD;   Location: [place];  Service: Vascular;  Laterality: Right;  ? PERIPHERAL VASCULAR BALLOON ANGIOPLASTY  08/13/2018  ? Procedure: PERIPHERAL VASCULAR BALLOON ANGIOPLASTY;  Surgeon: Elam Dutch, MD;  Location: [place] CV LAB;  Service: Cardiovascular;;  right AV fistula ?  ? PERIPHERAL VASCULAR BALLOON ANGIOPLASTY  11/22/2018  ? Procedure: PERIPHERAL VASCULAR BALLOON ANGIOPLASTY;  Surgeon: Waynetta Sandy, MD;  Location: [place] CV LAB;  Service: Cardiovascular;;  rt AV fistula  ? PERIPHERAL VASCULAR CATHETERIZATION N/A 10/08/2015  ? Procedure: A/V Shuntogram;  Surgeon: Angelia Mould, MD;  Location: [place] CV LAB;  Service: Cardiovascular;  Laterality: N/A;  ? REMOVAL OF A DIALYSIS CATHETER N/A 12/03/2021  ? Procedure: MINOR REMOVAL OF A TUNNELED DIALYSIS CATHETER;  Surgeon: Rosetta Posner, MD;  [facility];  Service: Vascular;  Laterality: N/A;  ? THROMBECTOMY W/ EMBOLECTOMY Right 12/10/2017  ? Procedure: THROMBECTOMY REVISION RIGHT ARM  ARTERIOVENOUS FISTULA;  Surgeon: Angelia Mould, MD;  Location: [facility] OR;  Service: Vascular;  Laterality: Right;  ? TRANSURETHRAL RESECTION OF PROSTATE N/A 01/04/2014  ? Procedure: TRANSURETHRAL RESECTION OF THE  PROSTATE (TURP) (procedure #2);  Surgeon: Marissa Nestle, MD;  Location: [facility];  Service: Urology;  Laterality: N/A;  ? ? ?Social History  ? ?Socioeconomic History  ? Marital status: Single  ?  Spouse name: Not on file  ? Number of children: Not on file  ? Years of education: Not on file  ? Highest education level: Not on file  ?Occupational History  ? Occupation: retired   ?Tobacco Use  ? Smoking status: Never  ? Smokeless tobacco: Never  ?Vaping Use  ? Vaping Use: Never used  ?Substance and Sexual Activity  ? Alcohol use: No  ? Drug use: No  ? Sexual activity: Not Currently  ?Other Topics Concern  ? Not on file  ?Social History Narrative  ? 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  ? [facility] term  resident of [facility]   ? ?Social Determinants of Health  ? ?Financial Resource Strain: Not on file  ?Food Insecurity: Not on file  ?Transportation Needs: Not on file  ?Physical Activity: Not on file  ?Stress: Not on file  ?Social Co

## 2022-01-30 DIAGNOSIS — D631 Anemia in chronic kidney disease: Secondary | ICD-10-CM | POA: Diagnosis not present

## 2022-01-30 DIAGNOSIS — Z992 Dependence on renal dialysis: Secondary | ICD-10-CM | POA: Diagnosis not present

## 2022-01-30 DIAGNOSIS — N250 Renal osteodystrophy: Secondary | ICD-10-CM | POA: Diagnosis not present

## 2022-01-30 DIAGNOSIS — D509 Iron deficiency anemia, unspecified: Secondary | ICD-10-CM | POA: Diagnosis not present

## 2022-01-30 DIAGNOSIS — N2581 Secondary hyperparathyroidism of renal origin: Secondary | ICD-10-CM | POA: Diagnosis not present

## 2022-01-30 DIAGNOSIS — N186 End stage renal disease: Secondary | ICD-10-CM | POA: Diagnosis not present

## 2022-02-01 DIAGNOSIS — N250 Renal osteodystrophy: Secondary | ICD-10-CM | POA: Diagnosis not present

## 2022-02-01 DIAGNOSIS — N186 End stage renal disease: Secondary | ICD-10-CM | POA: Diagnosis not present

## 2022-02-01 DIAGNOSIS — Z992 Dependence on renal dialysis: Secondary | ICD-10-CM | POA: Diagnosis not present

## 2022-02-01 DIAGNOSIS — N2581 Secondary hyperparathyroidism of renal origin: Secondary | ICD-10-CM | POA: Diagnosis not present

## 2022-02-01 DIAGNOSIS — D631 Anemia in chronic kidney disease: Secondary | ICD-10-CM | POA: Diagnosis not present

## 2022-02-01 DIAGNOSIS — D509 Iron deficiency anemia, unspecified: Secondary | ICD-10-CM | POA: Diagnosis not present

## 2022-02-04 DIAGNOSIS — D631 Anemia in chronic kidney disease: Secondary | ICD-10-CM | POA: Diagnosis not present

## 2022-02-04 DIAGNOSIS — N186 End stage renal disease: Secondary | ICD-10-CM | POA: Diagnosis not present

## 2022-02-04 DIAGNOSIS — N2581 Secondary hyperparathyroidism of renal origin: Secondary | ICD-10-CM | POA: Diagnosis not present

## 2022-02-04 DIAGNOSIS — D509 Iron deficiency anemia, unspecified: Secondary | ICD-10-CM | POA: Diagnosis not present

## 2022-02-04 DIAGNOSIS — N250 Renal osteodystrophy: Secondary | ICD-10-CM | POA: Diagnosis not present

## 2022-02-04 DIAGNOSIS — Z992 Dependence on renal dialysis: Secondary | ICD-10-CM | POA: Diagnosis not present

## 2022-02-06 DIAGNOSIS — D509 Iron deficiency anemia, unspecified: Secondary | ICD-10-CM | POA: Diagnosis not present

## 2022-02-06 DIAGNOSIS — N250 Renal osteodystrophy: Secondary | ICD-10-CM | POA: Diagnosis not present

## 2022-02-06 DIAGNOSIS — N2581 Secondary hyperparathyroidism of renal origin: Secondary | ICD-10-CM | POA: Diagnosis not present

## 2022-02-06 DIAGNOSIS — Z992 Dependence on renal dialysis: Secondary | ICD-10-CM | POA: Diagnosis not present

## 2022-02-06 DIAGNOSIS — N186 End stage renal disease: Secondary | ICD-10-CM | POA: Diagnosis not present

## 2022-02-06 DIAGNOSIS — D631 Anemia in chronic kidney disease: Secondary | ICD-10-CM | POA: Diagnosis not present

## 2022-02-08 DIAGNOSIS — N2581 Secondary hyperparathyroidism of renal origin: Secondary | ICD-10-CM | POA: Diagnosis not present

## 2022-02-08 DIAGNOSIS — N250 Renal osteodystrophy: Secondary | ICD-10-CM | POA: Diagnosis not present

## 2022-02-08 DIAGNOSIS — D631 Anemia in chronic kidney disease: Secondary | ICD-10-CM | POA: Diagnosis not present

## 2022-02-08 DIAGNOSIS — Z992 Dependence on renal dialysis: Secondary | ICD-10-CM | POA: Diagnosis not present

## 2022-02-08 DIAGNOSIS — N186 End stage renal disease: Secondary | ICD-10-CM | POA: Diagnosis not present

## 2022-02-08 DIAGNOSIS — D509 Iron deficiency anemia, unspecified: Secondary | ICD-10-CM | POA: Diagnosis not present

## 2022-02-11 DIAGNOSIS — N2581 Secondary hyperparathyroidism of renal origin: Secondary | ICD-10-CM | POA: Diagnosis not present

## 2022-02-11 DIAGNOSIS — N186 End stage renal disease: Secondary | ICD-10-CM | POA: Diagnosis not present

## 2022-02-11 DIAGNOSIS — D631 Anemia in chronic kidney disease: Secondary | ICD-10-CM | POA: Diagnosis not present

## 2022-02-11 DIAGNOSIS — D509 Iron deficiency anemia, unspecified: Secondary | ICD-10-CM | POA: Diagnosis not present

## 2022-02-11 DIAGNOSIS — E119 Type 2 diabetes mellitus without complications: Secondary | ICD-10-CM | POA: Diagnosis not present

## 2022-02-11 DIAGNOSIS — Z992 Dependence on renal dialysis: Secondary | ICD-10-CM | POA: Diagnosis not present

## 2022-02-11 DIAGNOSIS — N250 Renal osteodystrophy: Secondary | ICD-10-CM | POA: Diagnosis not present

## 2022-02-13 DIAGNOSIS — Z992 Dependence on renal dialysis: Secondary | ICD-10-CM | POA: Diagnosis not present

## 2022-02-13 DIAGNOSIS — D631 Anemia in chronic kidney disease: Secondary | ICD-10-CM | POA: Diagnosis not present

## 2022-02-13 DIAGNOSIS — D509 Iron deficiency anemia, unspecified: Secondary | ICD-10-CM | POA: Diagnosis not present

## 2022-02-13 DIAGNOSIS — N186 End stage renal disease: Secondary | ICD-10-CM | POA: Diagnosis not present

## 2022-02-13 DIAGNOSIS — N250 Renal osteodystrophy: Secondary | ICD-10-CM | POA: Diagnosis not present

## 2022-02-13 DIAGNOSIS — N2581 Secondary hyperparathyroidism of renal origin: Secondary | ICD-10-CM | POA: Diagnosis not present

## 2022-02-15 DIAGNOSIS — N250 Renal osteodystrophy: Secondary | ICD-10-CM | POA: Diagnosis not present

## 2022-02-15 DIAGNOSIS — N186 End stage renal disease: Secondary | ICD-10-CM | POA: Diagnosis not present

## 2022-02-15 DIAGNOSIS — D509 Iron deficiency anemia, unspecified: Secondary | ICD-10-CM | POA: Diagnosis not present

## 2022-02-15 DIAGNOSIS — D631 Anemia in chronic kidney disease: Secondary | ICD-10-CM | POA: Diagnosis not present

## 2022-02-15 DIAGNOSIS — Z992 Dependence on renal dialysis: Secondary | ICD-10-CM | POA: Diagnosis not present

## 2022-02-15 DIAGNOSIS — N2581 Secondary hyperparathyroidism of renal origin: Secondary | ICD-10-CM | POA: Diagnosis not present

## 2022-02-17 ENCOUNTER — Encounter: Payer: Self-pay | Admitting: Internal Medicine

## 2022-02-17 ENCOUNTER — Non-Acute Institutional Stay (SKILLED_NURSING_FACILITY): Payer: Medicare Other | Admitting: Internal Medicine

## 2022-02-17 DIAGNOSIS — D696 Thrombocytopenia, unspecified: Secondary | ICD-10-CM | POA: Diagnosis not present

## 2022-02-17 DIAGNOSIS — E119 Type 2 diabetes mellitus without complications: Secondary | ICD-10-CM

## 2022-02-17 DIAGNOSIS — E034 Atrophy of thyroid (acquired): Secondary | ICD-10-CM

## 2022-02-17 DIAGNOSIS — U071 COVID-19: Secondary | ICD-10-CM

## 2022-02-17 DIAGNOSIS — E538 Deficiency of other specified B group vitamins: Secondary | ICD-10-CM | POA: Diagnosis not present

## 2022-02-17 DIAGNOSIS — R600 Localized edema: Secondary | ICD-10-CM | POA: Diagnosis not present

## 2022-02-17 DIAGNOSIS — I120 Hypertensive chronic kidney disease with stage 5 chronic kidney disease or end stage renal disease: Secondary | ICD-10-CM | POA: Diagnosis not present

## 2022-02-17 DIAGNOSIS — E1122 Type 2 diabetes mellitus with diabetic chronic kidney disease: Secondary | ICD-10-CM

## 2022-02-17 DIAGNOSIS — Z992 Dependence on renal dialysis: Secondary | ICD-10-CM | POA: Diagnosis not present

## 2022-02-17 DIAGNOSIS — N186 End stage renal disease: Secondary | ICD-10-CM

## 2022-02-17 NOTE — Assessment & Plan Note (Signed)
S/p full course of molnupiravir.  He has no residual COVID symptoms. ?

## 2022-02-17 NOTE — Assessment & Plan Note (Signed)
Current A1c is prediabetic at 5.8%.  No change in present regimen indicated. ?

## 2022-02-17 NOTE — Patient Instructions (Signed)
See assessment and plan under each diagnosis in the problem list and acutely for this visit 

## 2022-02-17 NOTE — Assessment & Plan Note (Signed)
TSH is current and therapeutic; no change in relatively high dose of L-thyroxine. ?

## 2022-02-17 NOTE — Assessment & Plan Note (Signed)
Trace edema at the sock line is not significant.  There is no clinical evidence of cardiac decompensation. ?

## 2022-02-17 NOTE — Assessment & Plan Note (Addendum)
Platelet count has improved slightly to 113,000.  No bleeding dyscrasias reported. ?

## 2022-02-17 NOTE — Assessment & Plan Note (Signed)
B12 level is low normal on 1000 mcg supplement daily ?

## 2022-02-17 NOTE — Progress Notes (Signed)
? ?NURSING HOME LOCATION:  [place] ?ROOM NUMBER:  112 D ? ?CODE STATUS:  Full ? ?PCP: Ok Edwards NP,PSC ? ?This is a nursing facility follow up visit of chronic medical diagnoses & to document compliance with Regulation 483.30 (c) in [employer] [place] Phase [employer] which mandates caregiver visit ( visits can alternate among physician, PA or NP as per statutes) within 10 days of 30 days / 60 days/ 90 days post admission to SNF date   ? ?Interim medical record and care since last SNF visit was updated with review of diagnostic studies and change in clinical status since last visit were documented. ? ?HPI: He is a permanent resident of the facility with medical diagnoses of hypothyroidism, diabetes with ESRD on dialysis, dyslipidemia, GERD, PVD symptomatic BPH, history of DVT, protein/caloric malnutrition, and history of primary colorectal adenocarcinoma. ?He has had multiple vascular procedures related to requiring hemodialysis as well as peripheral vascular balloon angioplasty.  He underwent colectomy in 2018. ? ?He was treated for active COVID 19 infection early this month with oral anti-viral course of molnupiravir. D-dimer was 0.43 & CRP < 0.5. ? ?Renal function is checked 3 days a week at hemodialysis on Tuesday, Thursday, and Saturday.  Most recent renal function in Epic is 4/4 with a creatinine of 11.35 and GFR of 4.  In the context of ESRD he has chronic anemia with H/H of 8.7/26 with macrocytic indices. B12 level was low normal at 272 on 11/22/2021.  MCV is 105.3.  Thrombocytopenia is present with a platelet count of 113,000.  A1c was prediabetic at 5.8%.  TSH was therapeutic in January on relatively high-dose L-thyroxine. ? ?Review of systems: Complete review of systems was negative except for some validation of peripheral edema.  This is not associated with any significant cardiopulmonary symptoms.  He also denies any active COVID symptoms. ? ?Constitutional: No fever,  significant weight change, fatigue  ?Eyes: No redness, discharge, pain, vision change ?ENT/mouth: No nasal congestion,  purulent discharge, earache, change in hearing, sore throat  ?Cardiovascular: No chest pain, palpitations, paroxysmal nocturnal dyspnea ?Respiratory: No cough, sputum production, hemoptysis, DOE, significant snoring, apnea   ?Gastrointestinal: No heartburn, dysphagia, abdominal pain, nausea /vomiting, rectal bleeding, melena, change in bowels ?Genitourinary: No dysuria, hematuria, pyuria, incontinence, nocturia ?Musculoskeletal: No joint stiffness, joint swelling, weakness, pain ?Dermatologic: No rash, pruritus, change in appearance of skin ?Neurologic: No dizziness, headache, syncope, seizures, numbness, tingling ?Psychiatric: No significant anxiety, depression, insomnia, anorexia ?Endocrine: No change in hair/skin/nails, excessive thirst, excessive hunger, excessive urination  ?Hematologic/lymphatic: No significant bruising, lymphadenopathy, abnormal bleeding ?Allergy/immunology: No itchy/watery eyes, significant sneezing, urticaria, angioedema ? ?Physical exam:  ?Pertinent or positive findings: Initially he was in his wheelchair on the patio.  As he mobilized back to his room; he seemed to neglect the left upper extremity somewhat.  He denied any asymmetric weakness.  There is very slight exotropia of the right eye.  Arcus senilis is present.  He has dense plaque of multiple teeth.  He is missing the anterior maxillary teeth.  Breath sounds are decreased.  Indeed he does have trace edema at the sock line.  Dorsalis pedis pulses are stronger than posterior tibial pulses. Strength to opposition is fair. The right extremities may be slightly stronger to opposition than the left.  ? ?General appearance: Adequately nourished; no acute distress, increased work of breathing is present.   ?Lymphatic: No lymphadenopathy about the head, neck, axilla. ?Eyes: No conjunctival inflammation or lid  edema is  present. There is no scleral icterus. ?Ears:  External ear exam shows no significant lesions or deformities.   ?Nose:  External nasal examination shows no deformity or inflammation. Nasal mucosa are pink and moist without lesions, exudates ?Oral exam:  There is no oropharyngeal erythema or exudate. ?Neck:  No thyromegaly, masses, tenderness noted.    ?Heart:  Normal rate and regular rhythm. S1 and S2 normal without gallop, murmur, click, rub .  ?Lungs: without wheezes, rhonchi, rales, rubs. ?Abdomen: Bowel sounds are normal. Abdomen is soft and nontender with no organomegaly, hernias, masses. ?GU: Deferred  ?Extremities:  No cyanosis, clubbing  ?Neurologic exam :Balance, [place], finger to nose testing could not be completed due to clinical state ?Skin: Warm & dry w/o tenting. ?No significant lesions or rash. ? ?See summary under each active problem in the Problem List with associated updated therapeutic plan ? ? ?

## 2022-02-18 DIAGNOSIS — N186 End stage renal disease: Secondary | ICD-10-CM | POA: Diagnosis not present

## 2022-02-18 DIAGNOSIS — D509 Iron deficiency anemia, unspecified: Secondary | ICD-10-CM | POA: Diagnosis not present

## 2022-02-18 DIAGNOSIS — N2581 Secondary hyperparathyroidism of renal origin: Secondary | ICD-10-CM | POA: Diagnosis not present

## 2022-02-18 DIAGNOSIS — N250 Renal osteodystrophy: Secondary | ICD-10-CM | POA: Diagnosis not present

## 2022-02-18 DIAGNOSIS — D631 Anemia in chronic kidney disease: Secondary | ICD-10-CM | POA: Diagnosis not present

## 2022-02-18 DIAGNOSIS — Z992 Dependence on renal dialysis: Secondary | ICD-10-CM | POA: Diagnosis not present

## 2022-02-19 DIAGNOSIS — F4322 Adjustment disorder with anxiety: Secondary | ICD-10-CM | POA: Diagnosis not present

## 2022-02-19 DIAGNOSIS — F70 Mild intellectual disabilities: Secondary | ICD-10-CM | POA: Diagnosis not present

## 2022-02-20 DIAGNOSIS — N250 Renal osteodystrophy: Secondary | ICD-10-CM | POA: Diagnosis not present

## 2022-02-20 DIAGNOSIS — N2581 Secondary hyperparathyroidism of renal origin: Secondary | ICD-10-CM | POA: Diagnosis not present

## 2022-02-20 DIAGNOSIS — N186 End stage renal disease: Secondary | ICD-10-CM | POA: Diagnosis not present

## 2022-02-20 DIAGNOSIS — Z992 Dependence on renal dialysis: Secondary | ICD-10-CM | POA: Diagnosis not present

## 2022-02-20 DIAGNOSIS — D509 Iron deficiency anemia, unspecified: Secondary | ICD-10-CM | POA: Diagnosis not present

## 2022-02-20 DIAGNOSIS — D631 Anemia in chronic kidney disease: Secondary | ICD-10-CM | POA: Diagnosis not present

## 2022-02-22 DIAGNOSIS — D509 Iron deficiency anemia, unspecified: Secondary | ICD-10-CM | POA: Diagnosis not present

## 2022-02-22 DIAGNOSIS — N2581 Secondary hyperparathyroidism of renal origin: Secondary | ICD-10-CM | POA: Diagnosis not present

## 2022-02-22 DIAGNOSIS — N186 End stage renal disease: Secondary | ICD-10-CM | POA: Diagnosis not present

## 2022-02-22 DIAGNOSIS — N250 Renal osteodystrophy: Secondary | ICD-10-CM | POA: Diagnosis not present

## 2022-02-22 DIAGNOSIS — D631 Anemia in chronic kidney disease: Secondary | ICD-10-CM | POA: Diagnosis not present

## 2022-02-22 DIAGNOSIS — Z992 Dependence on renal dialysis: Secondary | ICD-10-CM | POA: Diagnosis not present

## 2022-02-23 DIAGNOSIS — Z992 Dependence on renal dialysis: Secondary | ICD-10-CM | POA: Diagnosis not present

## 2022-02-23 DIAGNOSIS — N186 End stage renal disease: Secondary | ICD-10-CM | POA: Diagnosis not present

## 2022-02-25 DIAGNOSIS — Z992 Dependence on renal dialysis: Secondary | ICD-10-CM | POA: Diagnosis not present

## 2022-02-25 DIAGNOSIS — N250 Renal osteodystrophy: Secondary | ICD-10-CM | POA: Diagnosis not present

## 2022-02-25 DIAGNOSIS — D509 Iron deficiency anemia, unspecified: Secondary | ICD-10-CM | POA: Diagnosis not present

## 2022-02-25 DIAGNOSIS — D631 Anemia in chronic kidney disease: Secondary | ICD-10-CM | POA: Diagnosis not present

## 2022-02-25 DIAGNOSIS — N186 End stage renal disease: Secondary | ICD-10-CM | POA: Diagnosis not present

## 2022-02-25 DIAGNOSIS — N2581 Secondary hyperparathyroidism of renal origin: Secondary | ICD-10-CM | POA: Diagnosis not present

## 2022-02-27 DIAGNOSIS — N250 Renal osteodystrophy: Secondary | ICD-10-CM | POA: Diagnosis not present

## 2022-02-27 DIAGNOSIS — D509 Iron deficiency anemia, unspecified: Secondary | ICD-10-CM | POA: Diagnosis not present

## 2022-02-27 DIAGNOSIS — D631 Anemia in chronic kidney disease: Secondary | ICD-10-CM | POA: Diagnosis not present

## 2022-02-27 DIAGNOSIS — N186 End stage renal disease: Secondary | ICD-10-CM | POA: Diagnosis not present

## 2022-02-27 DIAGNOSIS — N2581 Secondary hyperparathyroidism of renal origin: Secondary | ICD-10-CM | POA: Diagnosis not present

## 2022-02-27 DIAGNOSIS — Z992 Dependence on renal dialysis: Secondary | ICD-10-CM | POA: Diagnosis not present

## 2022-03-01 DIAGNOSIS — D631 Anemia in chronic kidney disease: Secondary | ICD-10-CM | POA: Diagnosis not present

## 2022-03-01 DIAGNOSIS — N186 End stage renal disease: Secondary | ICD-10-CM | POA: Diagnosis not present

## 2022-03-01 DIAGNOSIS — D509 Iron deficiency anemia, unspecified: Secondary | ICD-10-CM | POA: Diagnosis not present

## 2022-03-01 DIAGNOSIS — N2581 Secondary hyperparathyroidism of renal origin: Secondary | ICD-10-CM | POA: Diagnosis not present

## 2022-03-01 DIAGNOSIS — Z992 Dependence on renal dialysis: Secondary | ICD-10-CM | POA: Diagnosis not present

## 2022-03-01 DIAGNOSIS — N250 Renal osteodystrophy: Secondary | ICD-10-CM | POA: Diagnosis not present

## 2022-03-04 DIAGNOSIS — Z992 Dependence on renal dialysis: Secondary | ICD-10-CM | POA: Diagnosis not present

## 2022-03-04 DIAGNOSIS — N250 Renal osteodystrophy: Secondary | ICD-10-CM | POA: Diagnosis not present

## 2022-03-04 DIAGNOSIS — D509 Iron deficiency anemia, unspecified: Secondary | ICD-10-CM | POA: Diagnosis not present

## 2022-03-04 DIAGNOSIS — D631 Anemia in chronic kidney disease: Secondary | ICD-10-CM | POA: Diagnosis not present

## 2022-03-04 DIAGNOSIS — N2581 Secondary hyperparathyroidism of renal origin: Secondary | ICD-10-CM | POA: Diagnosis not present

## 2022-03-04 DIAGNOSIS — N186 End stage renal disease: Secondary | ICD-10-CM | POA: Diagnosis not present

## 2022-03-06 DIAGNOSIS — D631 Anemia in chronic kidney disease: Secondary | ICD-10-CM | POA: Diagnosis not present

## 2022-03-06 DIAGNOSIS — D509 Iron deficiency anemia, unspecified: Secondary | ICD-10-CM | POA: Diagnosis not present

## 2022-03-06 DIAGNOSIS — N250 Renal osteodystrophy: Secondary | ICD-10-CM | POA: Diagnosis not present

## 2022-03-06 DIAGNOSIS — N186 End stage renal disease: Secondary | ICD-10-CM | POA: Diagnosis not present

## 2022-03-06 DIAGNOSIS — N2581 Secondary hyperparathyroidism of renal origin: Secondary | ICD-10-CM | POA: Diagnosis not present

## 2022-03-06 DIAGNOSIS — Z992 Dependence on renal dialysis: Secondary | ICD-10-CM | POA: Diagnosis not present

## 2022-03-08 DIAGNOSIS — N2581 Secondary hyperparathyroidism of renal origin: Secondary | ICD-10-CM | POA: Diagnosis not present

## 2022-03-08 DIAGNOSIS — D631 Anemia in chronic kidney disease: Secondary | ICD-10-CM | POA: Diagnosis not present

## 2022-03-08 DIAGNOSIS — N186 End stage renal disease: Secondary | ICD-10-CM | POA: Diagnosis not present

## 2022-03-08 DIAGNOSIS — D509 Iron deficiency anemia, unspecified: Secondary | ICD-10-CM | POA: Diagnosis not present

## 2022-03-08 DIAGNOSIS — N250 Renal osteodystrophy: Secondary | ICD-10-CM | POA: Diagnosis not present

## 2022-03-08 DIAGNOSIS — Z992 Dependence on renal dialysis: Secondary | ICD-10-CM | POA: Diagnosis not present

## 2022-03-11 DIAGNOSIS — D631 Anemia in chronic kidney disease: Secondary | ICD-10-CM | POA: Diagnosis not present

## 2022-03-11 DIAGNOSIS — N2581 Secondary hyperparathyroidism of renal origin: Secondary | ICD-10-CM | POA: Diagnosis not present

## 2022-03-11 DIAGNOSIS — N250 Renal osteodystrophy: Secondary | ICD-10-CM | POA: Diagnosis not present

## 2022-03-11 DIAGNOSIS — N186 End stage renal disease: Secondary | ICD-10-CM | POA: Diagnosis not present

## 2022-03-11 DIAGNOSIS — D509 Iron deficiency anemia, unspecified: Secondary | ICD-10-CM | POA: Diagnosis not present

## 2022-03-11 DIAGNOSIS — Z992 Dependence on renal dialysis: Secondary | ICD-10-CM | POA: Diagnosis not present

## 2022-03-13 DIAGNOSIS — N250 Renal osteodystrophy: Secondary | ICD-10-CM | POA: Diagnosis not present

## 2022-03-13 DIAGNOSIS — Z992 Dependence on renal dialysis: Secondary | ICD-10-CM | POA: Diagnosis not present

## 2022-03-13 DIAGNOSIS — D631 Anemia in chronic kidney disease: Secondary | ICD-10-CM | POA: Diagnosis not present

## 2022-03-13 DIAGNOSIS — N2581 Secondary hyperparathyroidism of renal origin: Secondary | ICD-10-CM | POA: Diagnosis not present

## 2022-03-13 DIAGNOSIS — D509 Iron deficiency anemia, unspecified: Secondary | ICD-10-CM | POA: Diagnosis not present

## 2022-03-13 DIAGNOSIS — N186 End stage renal disease: Secondary | ICD-10-CM | POA: Diagnosis not present

## 2022-03-15 DIAGNOSIS — N186 End stage renal disease: Secondary | ICD-10-CM | POA: Diagnosis not present

## 2022-03-15 DIAGNOSIS — D631 Anemia in chronic kidney disease: Secondary | ICD-10-CM | POA: Diagnosis not present

## 2022-03-15 DIAGNOSIS — N2581 Secondary hyperparathyroidism of renal origin: Secondary | ICD-10-CM | POA: Diagnosis not present

## 2022-03-15 DIAGNOSIS — D509 Iron deficiency anemia, unspecified: Secondary | ICD-10-CM | POA: Diagnosis not present

## 2022-03-15 DIAGNOSIS — N250 Renal osteodystrophy: Secondary | ICD-10-CM | POA: Diagnosis not present

## 2022-03-15 DIAGNOSIS — Z992 Dependence on renal dialysis: Secondary | ICD-10-CM | POA: Diagnosis not present

## 2022-03-18 DIAGNOSIS — D631 Anemia in chronic kidney disease: Secondary | ICD-10-CM | POA: Diagnosis not present

## 2022-03-18 DIAGNOSIS — Z23 Encounter for immunization: Secondary | ICD-10-CM | POA: Diagnosis not present

## 2022-03-18 DIAGNOSIS — D509 Iron deficiency anemia, unspecified: Secondary | ICD-10-CM | POA: Diagnosis not present

## 2022-03-18 DIAGNOSIS — N2581 Secondary hyperparathyroidism of renal origin: Secondary | ICD-10-CM | POA: Diagnosis not present

## 2022-03-18 DIAGNOSIS — Z992 Dependence on renal dialysis: Secondary | ICD-10-CM | POA: Diagnosis not present

## 2022-03-18 DIAGNOSIS — N250 Renal osteodystrophy: Secondary | ICD-10-CM | POA: Diagnosis not present

## 2022-03-18 DIAGNOSIS — N186 End stage renal disease: Secondary | ICD-10-CM | POA: Diagnosis not present

## 2022-03-20 ENCOUNTER — Non-Acute Institutional Stay (SKILLED_NURSING_FACILITY): Payer: Medicare Other | Admitting: Adult Health

## 2022-03-20 ENCOUNTER — Encounter: Payer: Self-pay | Admitting: Adult Health

## 2022-03-20 DIAGNOSIS — D696 Thrombocytopenia, unspecified: Secondary | ICD-10-CM | POA: Diagnosis not present

## 2022-03-20 DIAGNOSIS — N186 End stage renal disease: Secondary | ICD-10-CM | POA: Diagnosis not present

## 2022-03-20 DIAGNOSIS — Z992 Dependence on renal dialysis: Secondary | ICD-10-CM | POA: Diagnosis not present

## 2022-03-20 DIAGNOSIS — K219 Gastro-esophageal reflux disease without esophagitis: Secondary | ICD-10-CM

## 2022-03-20 DIAGNOSIS — E1169 Type 2 diabetes mellitus with other specified complication: Secondary | ICD-10-CM

## 2022-03-20 DIAGNOSIS — N2581 Secondary hyperparathyroidism of renal origin: Secondary | ICD-10-CM | POA: Diagnosis not present

## 2022-03-20 DIAGNOSIS — D631 Anemia in chronic kidney disease: Secondary | ICD-10-CM | POA: Diagnosis not present

## 2022-03-20 DIAGNOSIS — N250 Renal osteodystrophy: Secondary | ICD-10-CM | POA: Diagnosis not present

## 2022-03-20 DIAGNOSIS — E785 Hyperlipidemia, unspecified: Secondary | ICD-10-CM | POA: Diagnosis not present

## 2022-03-20 DIAGNOSIS — C189 Malignant neoplasm of colon, unspecified: Secondary | ICD-10-CM

## 2022-03-20 DIAGNOSIS — D509 Iron deficiency anemia, unspecified: Secondary | ICD-10-CM | POA: Diagnosis not present

## 2022-03-20 NOTE — Progress Notes (Signed)
Location:  [place] Room Number: 112D Place of Service:  SNF (31) Provider:  Ok Edwards, NP  CODE STATUS: FULL CODE  No Known Allergies  Chief Complaint  Patient presents with   Medical Management of Chronic Issues                                       GERD without esophagitis:  History of colon cancer:  Dyslipidemia associated with type 2 diabetes mellitus: Thrombocytopenia:    HPI:  He is a 76 year old long term resident of this facility being seen for the management of his chronic illnesses: GERD without esophagitis:  History of colon cancer:  Dyslipidemia associated with type 2 diabetes mellitus: Thrombocytopenia. There are no reports of uncontrolled pain. No heart burn. He had covid in April without complication.   Past Medical History:  Diagnosis Date   Abnormal CT scan, kidney 10/06/2011   Acute pyelonephritis 10/07/2011   Anemia    normocytic   Anxiety    mental retardation   Bladder wall thickening 10/06/2011   BPH (benign prostatic hypertrophy)    Diabetes mellitus    Dialysis patient ([place])    Tuesday, Thursday and Saturday,    DVT of leg (deep venous thrombosis) ([place]) 12/25/2016   Edema     history of lower extremity edema   GERD (gastroesophageal reflux disease)    Heme positive stool    Hydronephrosis    Hyperkalemia    Hyperlipidemia    Hypernatremia    Hypertension    Hypothyroidism    Impaired speech    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[place])    MR (mental retardation)    Muscle weakness    Obstructive uropathy    Perinephric abscess 10/07/2011   Poor historian poor historian   Primary colorectal adenocarcinoma ([place]) 04/2017   S/p total colectomy   Protein calorie malnutrition ([place])    Pyelonephritis    Renal failure (ARF), acute on chronic (HCC)    Renal insufficiency    chronic history   Sepsis ([place])    Smoking    Uremia    Urinary retention    UTI (lower urinary tract infection) 10/06/2011    Past  Surgical History:  Procedure Laterality Date   A/V FISTULAGRAM N/A 08/13/2018   Procedure: A/V FISTULAGRAM - Right Upper;  Surgeon: Elam Dutch, MD;  Location: [facility];  Service: Cardiovascular;  Laterality: N/A;   A/V FISTULAGRAM N/A 11/22/2018   Procedure: A/V FISTULAGRAM - Right Upper;  Surgeon: Waynetta Sandy, MD;  Location: [place] [facility];  Service: Cardiovascular;  Laterality: N/A;   AV FISTULA PLACEMENT Left 07/06/2015   Procedure:  INSERTION LEFT ARM ARTERIOVENOUS GORTEX GRAFT;  Surgeon: Angelia Mould, MD;  Location: [place];  Service: Vascular;  Laterality: Left;   AV FISTULA PLACEMENT Right 02/26/2016   Procedure: ARTERIOVENOUS (AV) FISTULA CREATION ;  Surgeon: Angelia Mould, MD;  Location: [place];  Service: Vascular;  Laterality: Right;   AV FISTULA PLACEMENT Right 11/25/2018   Procedure: INSERTION OF ARTERIOVENOUS (AV) ARTEGRAFT RIGHT UPPER ARM;  Surgeon: Waynetta Sandy, MD;  Location: [place];  Service: Vascular;  Laterality: Right;   AV FISTULA PLACEMENT Left 05/28/2021   Procedure: LEFT ARM ARTERIOVENOUS (AV) FISTULA;  Surgeon: Rosetta Posner, MD;  [facility];  Service: Vascular;  Laterality: Left;   [place]  Left 10/09/2015   Procedure: REMOVAL OF ARTERIOVENOUS GORETEX GRAFT ([place]) Evacuation of Lymphocele, Vein Patch angioplasty of brachial artery.;  Surgeon: Angelia Mould, MD;  Location: [place];  Service: Vascular;  Laterality: Left;   [place] Right 02/26/2016   Procedure: Right [place];  Surgeon: Angelia Mould, MD;  Location: [place];  Service: Vascular;  Laterality: Right;   [place] Left 07/30/2021   Procedure: LEFT ARM SECOND STAGE BASILIC VEIN TRANSPOSITION;  Surgeon: Rosetta Posner, MD;  [facility];  Service: Vascular;  Laterality: Left;   CIRCUMCISION N/A 01/04/2014   Procedure: CIRCUMCISION ADULT (procedure #1);  Surgeon: Marissa Nestle, MD;  [facility];  Service: Urology;  Laterality: N/A;   COLECTOMY N/A 05/04/2017   Procedure: TOTAL COLECTOMY;  Surgeon: Aviva Signs, MD;  [facility];  Service: General;  Laterality: N/A;   COLONOSCOPY N/A 04/27/2017   Surgeon: Daneil Dolin, MD; annular mass in the ascending colon likely representing cancer biopsied, multiple 6-22 mm polyps removed, clean rectum.  Pathology with multiple tubular adenomas, high-grade dysplasia noted in ascending colon and splenic flexure.   CYSTOSCOPY W/ RETROGRADES Bilateral 06/29/2015   Procedure: CYSTOSCOPY, DILATION OF URETHRAL STRICTURE WITH BILATERAL RETROGRADE PYELOGRAM,SUPRAPUBIC TUBE CHANGE;  Surgeon: Festus Aloe, MD;  Location[facility];  Service: Urology;  Laterality: Bilateral;   CYSTOSCOPY WITH URETHRAL DILATATION N/A 12/29/2013   Procedure: CYSTOSCOPY WITH URETHRAL DILATATION;  Surgeon: Marissa Nestle, MD;  [facility];  Service: Urology;  Laterality: N/A;   ESOPHAGOGASTRODUODENOSCOPY N/A 04/27/2017   Procedure: ESOPHAGOGASTRODUODENOSCOPY (EGD);  Surgeon: Daneil Dolin, MD;  [facility];  Service: Endoscopy;  Laterality: N/A;   FLEXIBLE SIGMOIDOSCOPY N/A 12/02/2021   Procedure: FLEXIBLE SIGMOIDOSCOPY;  Surgeon: Eloise Harman, DO;  [facility];  Service: Endoscopy;  Laterality: N/A;  10:30am, dialysis patient   INSERTION OF DIALYSIS CATHETER Right 11/25/2018   Procedure: INSERTION OF DIALYSIS CATHETER RIGHT INTERNAL JUGULAR;  Surgeon: Waynetta Sandy, MD;  Location: [place];  Service: Vascular;  Laterality: Right;   IR AV DIALY SHUNT INTRO NEEDLE/INTRACATH INITIAL W/PTA/IMG RIGHT Right 09/07/2018   IR FLUORO GUIDE CV LINE RIGHT  10/16/2020   IR REMOVAL TUN CV CATH W/O FL  01/12/2019   IR THROMBECTOMY AV FISTULA W/THROMBOLYSIS/PTA INC/SHUNT/IMG RIGHT Right 04/26/2018   IR US GUIDE VASC ACCESS RIGHT  04/26/2018   IR US GUIDE VASC ACCESS RIGHT  09/07/2018   IR US GUIDE VASC ACCESS  RIGHT  10/16/2020   IR US GUIDE VASC ACCESS RIGHT  10/16/2020   ORIF FEMUR FRACTURE Right 11/22/2016   Procedure: OPEN REDUCTION INTERNAL FIXATION (ORIF) DISTAL FEMUR FRACTURE;  Surgeon: Rod Can, MD;  Location: [place];  Service: Orthopedics;  Laterality: Right;   PATCH ANGIOPLASTY Right 12/10/2017   Procedure: PATCH ANGIOPLASTY;  Surgeon: Angelia Mould, MD;  Location: [place];  Service: Vascular;  Laterality: Right;   PERIPHERAL VASCULAR BALLOON ANGIOPLASTY  08/13/2018   Procedure: PERIPHERAL VASCULAR BALLOON ANGIOPLASTY;  Surgeon: Elam Dutch, MD;  Location: [place] [facility];  Service: Cardiovascular;;  right AV fistula    PERIPHERAL VASCULAR BALLOON ANGIOPLASTY  11/22/2018   Procedure: PERIPHERAL VASCULAR BALLOON ANGIOPLASTY;  Surgeon: Waynetta Sandy, MD;  Location: [place] [facility];  Service: Cardiovascular;;  rt AV fistula   PERIPHERAL VASCULAR CATHETERIZATION N/A 10/08/2015   Procedure: A/V Shuntogram;  Surgeon: Angelia Mould, MD;  Location: [place] [facility];  Service: Cardiovascular;  Laterality: N/A;   REMOVAL OF A DIALYSIS CATHETER N/A 12/03/2021   Procedure: MINOR REMOVAL OF A TUNNELED DIALYSIS CATHETER;  Surgeon: Rosetta Posner, MD;  [facility];  Service: Vascular;  Laterality: N/A;   THROMBECTOMY W/ EMBOLECTOMY Right 12/10/2017   Procedure: THROMBECTOMY REVISION RIGHT ARM  ARTERIOVENOUS FISTULA;  Surgeon: Angelia Mould, MD;  Location: [facility];  Service: Vascular;  Laterality: Right;   TRANSURETHRAL RESECTION OF PROSTATE N/A 01/04/2014   Procedure: TRANSURETHRAL RESECTION OF THE PROSTATE (TURP) (procedure #2);  Surgeon: Marissa Nestle, MD;  [facility];  Service: Urology;  Laterality: N/A;    Social History   Socioeconomic History   Marital status: Single    Spouse name: Not on file   Number of children: Not on file   Years of education: Not on file   Highest education level: Not on file  Occupational History    Occupation: retired   Tobacco Use   Smoking status: Never   Smokeless tobacco: Never  Vaping Use   Vaping Use: Never used  Substance and Sexual Activity   Alcohol use: No   Drug use: No   Sexual activity: Not Currently  Other Topics Concern   Not on file  Social History Narrative   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   Long term resident of [facility]    Social Determinants of Health   Financial Resource Strain: Not on file  Food Insecurity: Not on file  Transportation Needs: Not on file  Physical Activity: Not on file  Stress: Not on file  Social Connections: Not on file  Intimate Partner Violence: Not on file   Family History  Problem Relation Age of Onset   Cancer Mother    Colon cancer Neg Hx       VITAL SIGNS BP 116/74   Pulse 95   Temp 97.8 F (36.6 C)   Ht '5\' 8"'$  (1.727 m)   Wt 215 lb 6.4 oz (97.7 kg)   BMI 32.75 kg/m   Outpatient Encounter Medications as of 03/20/2022  Medication Sig   aspirin EC 81 MG tablet Take 81 mg by mouth daily. 0900   atorvastatin (LIPITOR) 20 MG tablet Take 20 mg by mouth at bedtime. 2000   Insulin Glargine (BASAGLAR KWIKPEN) 100 UNIT/ML SOPN Inject 20 Units into the skin at bedtime. 2100 DO NOT HOLD this insulin without provider notification   Insulin Pen Needle (BD AUTOSHIELD DUO) 30G X 5 MM MISC by Does not apply route. 3/16"   levothyroxine (SYNTHROID) 125 MCG tablet Take 125 mcg by mouth at bedtime. 2100   midodrine (PROAMATINE) 10 MG tablet Take 10 mg by mouth See admin instructions. Send med with resident on  Tues/Thurs/Sat to dialysis. do not give at [facility], give to resident to take with him. send with '5mg'$  to equal '15mg'$ . 6:00   midodrine (PROAMATINE) 5 MG tablet Take 5 mg by mouth See admin instructions. Send with resident on dialysis days.  Tues/Thurs/Sat  Take along with 10 mg to equal 15 mg 6:00   NON FORMULARY Diet: NAS, Cons CHO   omega-3 acid ethyl esters (LOVAZA) 1 g capsule Take  2 g by mouth at bedtime. 2100   omeprazole (PRILOSEC) 40 MG capsule Take 40 mg by mouth daily. 0900   polyethylene glycol (MIRALAX / GLYCOLAX) packet Take 17 g by mouth daily as needed for mild constipation.    sevelamer carbonate (RENVELA) 800 MG tablet Take 2,400 mg by mouth 3 (  three) times daily with meals. 0900, 1200, 1700   tamsulosin (FLOMAX) 0.4 MG CAPS capsule Take 0.4 mg by mouth daily at 6 PM. Give 30 minutes after a meal. Do not crush or chew   torsemide (DEMADEX) 20 MG tablet Take 20 mg by mouth as directed. Sunday, Monday, Wednesday, and Friday   vitamin B-12 (CYANOCOBALAMIN) 1000 MCG tablet Take 1,000 mcg by mouth daily.   No facility-administered encounter medications on file as of 03/20/2022.     SIGNIFICANT DIAGNOSTIC EXAMS  PREVIOUS   05-02-21: Chest x-ray: dialysis cath demonstrated on right. No acute cardiopulmonary process  NO NEW EXAMS.    LABS REVIEWED PREVIOUS;   03-07-21: tsh 5.008  05-02-21: wbc 3.8; hgb 10.9; hct 33.7; mcv 105.6 plt 106; glucose 105; bun 17; creat 7.94; k+ 3.8; na++ 138; ca 9.1; GFR 7; d-dimer: 0.95; CRP 0.7 06-12-21: tsh 5.269  06-27-21: hgb a1c 5.6 07-30-21: hgb 11.6; hct 34.0; glucose 104; bun 12; creat 7.30; k+ 5.2; na++ 135; ionized ca++ 0.8  08-12-21: tsh 0.822 11-07-21: wbc 4,5 hgb 9.5; hct 29.5; mcv 103.9 plt 107; glucose 129; bun 28; creat 9.01; k+ 4.5; na++ 138; ca 8.6; GFR 6; liver normal albumin 3.1; hgb a1c 5.8; chol 60 ldl 18; trig 71; hdl 28; tsh 1.538  11-22-21: hgb 9,3; hct 28.4; iron 80; tibc 203; ferritin 474; vitamin B 12: 272 folate 6.6   TODAY  12-02-21: hgb 10.5; hct 31.0; glucose 85; bun 25; creat 9.80; k+ 3.9; na++ 140;   01-28-22; wbc 4.8; hgb 8.7; hct 26.0; mcv 105.3; plt 113; glucose 1; bun 40; creat 11.35; k+ 3.8; na++ 141; ca 8.8; GFR 4 d-dimer 0.43; CRP <0.5   Review of Systems  Constitutional:  Negative for malaise/fatigue.  Respiratory:  Negative for cough and shortness of breath.   Cardiovascular:  Negative for  chest pain, palpitations and leg swelling.  Gastrointestinal:  Negative for abdominal pain, constipation and heartburn.  Musculoskeletal:  Negative for back pain, joint pain and myalgias.  Skin: Negative.   Neurological:  Negative for dizziness.  Psychiatric/Behavioral:  The patient is not nervous/anxious.    Physical Exam Constitutional:      General: He is not in acute distress.    Appearance: He is well-developed. He is obese. He is not diaphoretic.  Neck:     Thyroid: No thyromegaly.  Cardiovascular:     Rate and Rhythm: Normal rate and regular rhythm.     Pulses: Normal pulses.     Heart sounds: Normal heart sounds.  Pulmonary:     Effort: Pulmonary effort is normal. No respiratory distress.     Breath sounds: Normal breath sounds.  Abdominal:     General: Bowel sounds are normal. There is no distension.     Palpations: Abdomen is soft.     Tenderness: There is no abdominal tenderness.  Musculoskeletal:     Cervical back: Neck supple.     Right lower leg: No edema.     Left lower leg: No edema.     Comments: Left arm fistula + bruit + thrill     Lymphadenopathy:     Cervical: No cervical adenopathy.  Skin:    General: Skin is warm and dry.  Neurological:     Mental Status: He is alert. Mental status is at baseline.  Psychiatric:        Mood and Affect: Mood normal.     ASSESSMENT/ PLAN:  TODAY  GERD without esophagitis: will continue prilosec 40 mg daily  2. History of colon cancer: s/p colectomy last seen on 11-18-21.  3. Dyslipidemia associated with type 2 diabetes mellitus: LDL 64; trig 319; will continue lipitor 80 mg daily and lovaza 2 gm daily   4. Thrombocytopenia: platelet 113.    PREVIOUS   5. End stage renal disease on hemodialysis  due to type 2 diabetes mellitus/hemodialysis dependent: on dialysis three days weekly midodrine 15 mg with dialysis 1200 cc fluid restriction renvela 1600 mg with meals and 800 mg with snacks  6. Other specified  hypothyroidism: is stable tsh 0.822 will continue synthroid 125 mcg daily   7. 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 Major depression single episode moderate: is stable off medications  will monitor    Ok Edwards NP [facility]  call [phone]

## 2022-03-21 ENCOUNTER — Encounter: Payer: Self-pay | Admitting: Adult Health

## 2022-03-21 ENCOUNTER — Non-Acute Institutional Stay (SKILLED_NURSING_FACILITY): Payer: Medicare Other | Admitting: Adult Health

## 2022-03-21 DIAGNOSIS — E1122 Type 2 diabetes mellitus with diabetic chronic kidney disease: Secondary | ICD-10-CM

## 2022-03-21 DIAGNOSIS — N186 End stage renal disease: Secondary | ICD-10-CM | POA: Diagnosis not present

## 2022-03-21 DIAGNOSIS — Z992 Dependence on renal dialysis: Secondary | ICD-10-CM | POA: Diagnosis not present

## 2022-03-21 DIAGNOSIS — I700 Atherosclerosis of aorta: Secondary | ICD-10-CM | POA: Diagnosis not present

## 2022-03-21 DIAGNOSIS — I120 Hypertensive chronic kidney disease with stage 5 chronic kidney disease or end stage renal disease: Secondary | ICD-10-CM | POA: Diagnosis not present

## 2022-03-21 NOTE — Progress Notes (Signed)
Location:  [facility] Room Number: 112-D Place of Service:  SNF (31)   CODE STATUS: Full Code  No Known Allergies  Chief Complaint  Patient presents with   Acute Visit    Care plan meeting    HPI:  We have come together for her care plan meeting. Family present  BIMS 5/15 mood 0/30. He uses wheelchair with no falls. He requires independent to supervision for his adl care. He is continent of bladder and bowel. Dietary:  weight is 215.4 pounds; NOS CON   CHO diet 1200 cc fluid restriction; good appetite. Does not adhere to fluid restriction Therapy: none at this time . Activities:  spends most of time outside; does not come to group activities.    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 Dependence on renal dialysis  Past Medical History:  Diagnosis Date   Abnormal CT scan, kidney 10/06/2011   Acute pyelonephritis 10/07/2011   Anemia    normocytic   Anxiety    mental retardation   Bladder wall thickening 10/06/2011   BPH (benign prostatic hypertrophy)    Diabetes mellitus    Dialysis patient ([place])    Tuesday, Thursday and Saturday,    DVT of leg (deep venous thrombosis) ([place]) 12/25/2016   Edema     history of lower extremity edema   GERD (gastroesophageal reflux disease)    Heme positive stool    Hydronephrosis    Hyperkalemia    Hyperlipidemia    Hypernatremia    Hypertension    Hypothyroidism    Impaired speech    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[place])    MR (mental retardation)    Muscle weakness    Obstructive uropathy    Perinephric abscess 10/07/2011   Poor historian poor historian   Primary colorectal adenocarcinoma ([employer]) 04/2017   S/p total colectomy   Protein calorie malnutrition ([place])    Pyelonephritis    Renal failure (ARF), acute on chronic (HCC)    Renal insufficiency    chronic history   Sepsis  ([place])    Smoking    Uremia    Urinary retention    UTI (lower urinary tract infection) 10/06/2011    Past Surgical History:  Procedure Laterality Date   A/V FISTULAGRAM N/A 08/13/2018   Procedure: A/V FISTULAGRAM - Right Upper;  Surgeon: Elam Dutch, MD;  Location: [facility];  Service: Cardiovascular;  Laterality: N/A;   A/V FISTULAGRAM N/A 11/22/2018   Procedure: A/V FISTULAGRAM - Right Upper;  Surgeon: Waynetta Sandy, MD;  Location: [facility];  Service: Cardiovascular;  Laterality: N/A;   AV FISTULA PLACEMENT Left 07/06/2015   Procedure:  INSERTION LEFT ARM ARTERIOVENOUS GORTEX GRAFT;  Surgeon: Angelia Mould, MD;  Location: [facility];  Service: Vascular;  Laterality: Left;   AV FISTULA PLACEMENT Right 02/26/2016   Procedure: ARTERIOVENOUS (AV) FISTULA CREATION ;  Surgeon: Angelia Mould, MD;  Location: [place] [facility];  Service: Vascular;  Laterality: Right;   AV FISTULA PLACEMENT Right 11/25/2018   Procedure: INSERTION OF ARTERIOVENOUS (AV) ARTEGRAFT RIGHT UPPER ARM;  Surgeon: Waynetta Sandy, MD;  Location: [facility];  Service: Vascular;  Laterality: Right;   AV FISTULA PLACEMENT Left 05/28/2021   Procedure: LEFT ARM ARTERIOVENOUS (AV) FISTULA;  Surgeon: Rosetta Posner, MD;  [facility];  Service: Vascular;  Laterality: Left;  [place] REMOVAL Left 10/09/2015   Procedure: REMOVAL OF ARTERIOVENOUS GORETEX GRAFT ([place]) Evacuation of Lymphocele, Vein Patch angioplasty of brachial artery.;  Surgeon: Angelia Mould, MD;  Location: [place];  Service: Vascular;  Laterality: Left;   [place] Right 02/26/2016   Procedure: Right [place];  Surgeon: Angelia Mould, MD;  Location: [facility];  Service: Vascular;  Laterality: Right;   [place] Left 07/30/2021   Procedure: LEFT ARM SECOND STAGE BASILIC VEIN TRANSPOSITION;  Surgeon: Rosetta Posner, MD;  Location[facility];  Service: Vascular;   Laterality: Left;   CIRCUMCISION N/A 01/04/2014   Procedure: CIRCUMCISION ADULT (procedure #1);  Surgeon: Marissa Nestle, MD;  [facility];  Service: Urology;  Laterality: N/A;   COLECTOMY N/A 05/04/2017   Procedure: TOTAL COLECTOMY;  Surgeon: Aviva Signs, MD;  [facility];  Service: General;  Laterality: N/A;   COLONOSCOPY N/A 04/27/2017   Surgeon: Daneil Dolin, MD; annular mass in the ascending colon likely representing cancer biopsied, multiple 6-22 mm polyps removed, clean rectum.  Pathology with multiple tubular adenomas, high-grade dysplasia noted in ascending colon and splenic flexure.   CYSTOSCOPY W/ RETROGRADES Bilateral 06/29/2015   Procedure: CYSTOSCOPY, DILATION OF URETHRAL STRICTURE WITH BILATERAL RETROGRADE PYELOGRAM,SUPRAPUBIC TUBE CHANGE;  Surgeon: Festus Aloe, MD;  [facility];  Service: Urology;  Laterality: Bilateral;   CYSTOSCOPY WITH URETHRAL DILATATION N/A 12/29/2013   Procedure: CYSTOSCOPY WITH URETHRAL DILATATION;  Surgeon: Marissa Nestle, MD;  [facility];  Service: Urology;  Laterality: N/A;   ESOPHAGOGASTRODUODENOSCOPY N/A 04/27/2017   Procedure: ESOPHAGOGASTRODUODENOSCOPY (EGD);  Surgeon: Daneil Dolin, MD;  [facility];  Service: Endoscopy;  Laterality: N/A;   FLEXIBLE SIGMOIDOSCOPY N/A 12/02/2021   Procedure: FLEXIBLE SIGMOIDOSCOPY;  Surgeon: Eloise Harman, DO;  [facility];  Service: Endoscopy;  Laterality: N/A;  10:30am, dialysis patient   INSERTION OF DIALYSIS CATHETER Right 11/25/2018   Procedure: INSERTION OF DIALYSIS CATHETER RIGHT INTERNAL JUGULAR;  Surgeon: Waynetta Sandy, MD;  Location: [facility];  Service: Vascular;  Laterality: Right;   IR AV DIALY SHUNT INTRO NEEDLE/INTRACATH INITIAL W/PTA/IMG RIGHT Right 09/07/2018   IR FLUORO GUIDE CV LINE RIGHT  10/16/2020   IR REMOVAL TUN CV CATH W/O FL  01/12/2019   IR THROMBECTOMY AV FISTULA W/THROMBOLYSIS/PTA INC/SHUNT/IMG RIGHT Right 04/26/2018    IR US GUIDE VASC ACCESS RIGHT  04/26/2018   IR US GUIDE VASC ACCESS RIGHT  09/07/2018   IR US GUIDE VASC ACCESS RIGHT  10/16/2020   IR US GUIDE VASC ACCESS RIGHT  10/16/2020   ORIF FEMUR FRACTURE Right 11/22/2016   Procedure: OPEN REDUCTION INTERNAL FIXATION (ORIF) DISTAL FEMUR FRACTURE;  Surgeon: Rod Can, MD;  Location: [facility];  Service: Orthopedics;  Laterality: Right;   PATCH ANGIOPLASTY Right 12/10/2017   Procedure: PATCH ANGIOPLASTY;  Surgeon: Angelia Mould, MD;  Location: [facility];  Service: Vascular;  Laterality: Right;   PERIPHERAL VASCULAR BALLOON ANGIOPLASTY  08/13/2018   Procedure: PERIPHERAL VASCULAR BALLOON ANGIOPLASTY;  Surgeon: Elam Dutch, MD;  Location: [facility];  Service: Cardiovascular;;  right AV fistula    PERIPHERAL VASCULAR BALLOON ANGIOPLASTY  11/22/2018   Procedure: PERIPHERAL VASCULAR BALLOON ANGIOPLASTY;  Surgeon: Waynetta Sandy, MD;  Location: [facility];  Service: Cardiovascular;;  rt AV fistula   PERIPHERAL VASCULAR CATHETERIZATION N/A 10/08/2015   Procedure: A/V Shuntogram;  Surgeon: Angelia Mould, MD;  Location: [facility];  Service:  Cardiovascular;  Laterality: N/A;   REMOVAL OF A DIALYSIS CATHETER N/A 12/03/2021   Procedure: MINOR REMOVAL OF A TUNNELED DIALYSIS CATHETER;  Surgeon: Rosetta Posner, MD;  [facility];  Service: Vascular;  Laterality: N/A;   THROMBECTOMY W/ EMBOLECTOMY Right 12/10/2017   Procedure: THROMBECTOMY REVISION RIGHT ARM  ARTERIOVENOUS FISTULA;  Surgeon: Angelia Mould, MD;  Location: [facility];  Service: Vascular;  Laterality: Right;   TRANSURETHRAL RESECTION OF PROSTATE N/A 01/04/2014   Procedure: TRANSURETHRAL RESECTION OF THE PROSTATE (TURP) (procedure #2);  Surgeon: Marissa Nestle, MD;  [facility];  Service: Urology;  Laterality: N/A;    Social History   Socioeconomic History   Marital status: Single    Spouse name: Not on file   Number of children:  Not on file   Years of education: Not on file   Highest education level: Not on file  Occupational History   Occupation: retired   Tobacco Use   Smoking status: Never   Smokeless tobacco: Never  Vaping Use   Vaping Use: Never used  Substance and Sexual Activity   Alcohol use: No   Drug use: No   Sexual activity: Not Currently  Other Topics Concern   Not on file  Social History Narrative   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   [facility]    Social Determinants of Health   Financial Resource Strain: Not on file  Food Insecurity: Not on file  Transportation Needs: Not on file  Physical Activity: Not on file  Stress: Not on file  Social Connections: Not on file  Intimate Partner Violence: Not on file   Family History  Problem Relation Age of Onset   Cancer Mother    Colon cancer Neg Hx       VITAL SIGNS BP 105/63   Pulse 73   Temp (!) 97.1 F (36.2 C)   Resp 20   Ht '5\' 8"'$  (1.727 m)   Wt 215 lb 6.4 oz (97.7 kg)   SpO2 98%   BMI 32.75 kg/m   Outpatient Encounter Medications as of 03/21/2022  Medication Sig   aspirin EC 81 MG tablet Take 81 mg by mouth daily. 0900   atorvastatin (LIPITOR) 20 MG tablet Take 20 mg by mouth at bedtime. 2000   Insulin Glargine (BASAGLAR KWIKPEN) 100 UNIT/ML SOPN Inject 20 Units into the skin at bedtime. 2100 DO NOT HOLD this insulin without provider notification   Insulin Pen Needle (BD AUTOSHIELD DUO) 30G X 5 MM MISC by Does not apply route. 3/16"   levothyroxine (SYNTHROID) 125 MCG tablet Take 125 mcg by mouth at bedtime. 2100   midodrine (PROAMATINE) 10 MG tablet Take 10 mg by mouth See admin instructions. Send med with resident on  Tues/Thurs/Sat to dialysis. do not give at [facility], give to resident to take with him. send with '5mg'$  to equal '15mg'$ . 6:00   midodrine (PROAMATINE) 5 MG tablet Take 5 mg by mouth See admin instructions. Send with resident on dialysis days.   Tues/Thurs/Sat  Take along with 10 mg to equal 15 mg 6:00   NON FORMULARY Diet: NAS, Cons CHO   omega-3 acid ethyl esters (LOVAZA) 1 g capsule Take 2 g by mouth at bedtime. 2100   omeprazole (PRILOSEC) 40 MG capsule Take 40 mg by mouth daily. 0900   polyethylene glycol (MIRALAX / GLYCOLAX) packet Take 17 g by mouth daily as needed for mild constipation.    sevelamer  carbonate (RENVELA) 800 MG tablet Take 2,400 mg by mouth 3 (three) times daily with meals. 0900, 1200, 1700 Twice A Day; 12:00 PM, 05:00 PM   tamsulosin (FLOMAX) 0.4 MG CAPS capsule Take 0.4 mg by mouth daily at 6 PM. Give 30 minutes after a meal. Do not crush or chew   torsemide (DEMADEX) 20 MG tablet Take 20 mg by mouth as directed. Sunday, Monday, Wednesday, and Friday   vitamin B-12 (CYANOCOBALAMIN) 1000 MCG tablet Take 1,000 mcg by mouth daily.   No facility-administered encounter medications on file as of 03/21/2022.     SIGNIFICANT DIAGNOSTIC EXAMS   PREVIOUS   05-02-21: Chest x-ray: dialysis cath demonstrated on right. No acute cardiopulmonary process  NO NEW EXAMS.    LABS REVIEWED PREVIOUS;   03-07-21: tsh 5.008  05-02-21: wbc 3.8; hgb 10.9; hct 33.7; mcv 105.6 plt 106; glucose 105; bun 17; creat 7.94; k+ 3.8; na++ 138; ca 9.1; GFR 7; d-dimer: 0.95; CRP 0.7 06-12-21: tsh 5.269  06-27-21: hgb a1c 5.6 07-30-21: hgb 11.6; hct 34.0; glucose 104; bun 12; creat 7.30; k+ 5.2; na++ 135; ionized ca++ 0.8  08-12-21: tsh 0.822 11-07-21: wbc 4,5 hgb 9.5; hct 29.5; mcv 103.9 plt 107; glucose 129; bun 28; creat 9.01; k+ 4.5; na++ 138; ca 8.6; GFR 6; liver normal albumin 3.1; hgb a1c 5.8; chol 60 ldl 18; trig 71; hdl 28; tsh 1.538  11-22-21: hgb 9,3; hct 28.4; iron 80; tibc 203; ferritin 474; vitamin B 12: 272 folate 6.6  12-02-21: hgb 10.5; hct 31.0; glucose 85; bun 25; creat 9.80; k+ 3.9; na++ 140;   01-28-22; wbc 4.8; hgb 8.7; hct 26.0; mcv 105.3; plt 113; glucose 1; bun 40; creat 11.35; k+ 3.8; na++ 141; ca 8.8; GFR 4 d-dimer 0.43; CRP  <0.5   NO NEW LABS.   Review of Systems  Constitutional:  Negative for malaise/fatigue.  Respiratory:  Negative for cough and shortness of breath.   Cardiovascular:  Negative for chest pain, palpitations and leg swelling.  Gastrointestinal:  Negative for abdominal pain, constipation and heartburn.  Musculoskeletal:  Negative for back pain, joint pain and myalgias.  Skin: Negative.   Neurological:  Negative for dizziness.  Psychiatric/Behavioral:  The patient is not nervous/anxious.    Physical Exam Constitutional:      General: He is not in acute distress.    Appearance: He is well-developed. He is not diaphoretic.  Neck:     Thyroid: No thyromegaly.  Cardiovascular:     Rate and Rhythm: Normal rate and regular rhythm.     Pulses: Normal pulses.     Heart sounds: Normal heart sounds.  Pulmonary:     Effort: Pulmonary effort is normal. No respiratory distress.     Breath sounds: Normal breath sounds.  Abdominal:     General: Bowel sounds are normal. There is no distension.     Palpations: Abdomen is soft.     Tenderness: There is no abdominal tenderness.  Musculoskeletal:        General: Normal range of motion.     Cervical back: Neck supple.  Lymphadenopathy:     Cervical: No cervical adenopathy.  Skin:    General: Skin is warm and dry.     Comments:  Left arm fistula + bruit + thrill     Neurological:     Mental Status: He is alert. Mental status is at baseline.  Psychiatric:        Mood and Affect: Mood normal.     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   Time spent with patient: 40 minutes: care plan medications; dietary    Ok Edwards NP [facility]   call [phone]

## 2022-03-22 DIAGNOSIS — N186 End stage renal disease: Secondary | ICD-10-CM | POA: Diagnosis not present

## 2022-03-22 DIAGNOSIS — Z992 Dependence on renal dialysis: Secondary | ICD-10-CM | POA: Diagnosis not present

## 2022-03-22 DIAGNOSIS — N2581 Secondary hyperparathyroidism of renal origin: Secondary | ICD-10-CM | POA: Diagnosis not present

## 2022-03-22 DIAGNOSIS — D509 Iron deficiency anemia, unspecified: Secondary | ICD-10-CM | POA: Diagnosis not present

## 2022-03-22 DIAGNOSIS — N250 Renal osteodystrophy: Secondary | ICD-10-CM | POA: Diagnosis not present

## 2022-03-22 DIAGNOSIS — D631 Anemia in chronic kidney disease: Secondary | ICD-10-CM | POA: Diagnosis not present

## 2022-03-25 DIAGNOSIS — N186 End stage renal disease: Secondary | ICD-10-CM | POA: Diagnosis not present

## 2022-03-25 DIAGNOSIS — D631 Anemia in chronic kidney disease: Secondary | ICD-10-CM | POA: Diagnosis not present

## 2022-03-25 DIAGNOSIS — N250 Renal osteodystrophy: Secondary | ICD-10-CM | POA: Diagnosis not present

## 2022-03-25 DIAGNOSIS — Z992 Dependence on renal dialysis: Secondary | ICD-10-CM | POA: Diagnosis not present

## 2022-03-25 DIAGNOSIS — N2581 Secondary hyperparathyroidism of renal origin: Secondary | ICD-10-CM | POA: Diagnosis not present

## 2022-03-25 DIAGNOSIS — D509 Iron deficiency anemia, unspecified: Secondary | ICD-10-CM | POA: Diagnosis not present

## 2022-03-26 DIAGNOSIS — Z992 Dependence on renal dialysis: Secondary | ICD-10-CM | POA: Diagnosis not present

## 2022-03-26 DIAGNOSIS — N186 End stage renal disease: Secondary | ICD-10-CM | POA: Diagnosis not present

## 2022-03-27 DIAGNOSIS — N2581 Secondary hyperparathyroidism of renal origin: Secondary | ICD-10-CM | POA: Diagnosis not present

## 2022-03-27 DIAGNOSIS — N186 End stage renal disease: Secondary | ICD-10-CM | POA: Diagnosis not present

## 2022-03-27 DIAGNOSIS — D631 Anemia in chronic kidney disease: Secondary | ICD-10-CM | POA: Diagnosis not present

## 2022-03-27 DIAGNOSIS — D509 Iron deficiency anemia, unspecified: Secondary | ICD-10-CM | POA: Diagnosis not present

## 2022-03-27 DIAGNOSIS — N250 Renal osteodystrophy: Secondary | ICD-10-CM | POA: Diagnosis not present

## 2022-03-27 DIAGNOSIS — Z992 Dependence on renal dialysis: Secondary | ICD-10-CM | POA: Diagnosis not present

## 2022-03-29 DIAGNOSIS — N250 Renal osteodystrophy: Secondary | ICD-10-CM | POA: Diagnosis not present

## 2022-03-29 DIAGNOSIS — D509 Iron deficiency anemia, unspecified: Secondary | ICD-10-CM | POA: Diagnosis not present

## 2022-03-29 DIAGNOSIS — N2581 Secondary hyperparathyroidism of renal origin: Secondary | ICD-10-CM | POA: Diagnosis not present

## 2022-03-29 DIAGNOSIS — Z992 Dependence on renal dialysis: Secondary | ICD-10-CM | POA: Diagnosis not present

## 2022-03-29 DIAGNOSIS — D631 Anemia in chronic kidney disease: Secondary | ICD-10-CM | POA: Diagnosis not present

## 2022-03-29 DIAGNOSIS — N186 End stage renal disease: Secondary | ICD-10-CM | POA: Diagnosis not present

## 2022-04-01 DIAGNOSIS — N186 End stage renal disease: Secondary | ICD-10-CM | POA: Diagnosis not present

## 2022-04-01 DIAGNOSIS — N2581 Secondary hyperparathyroidism of renal origin: Secondary | ICD-10-CM | POA: Diagnosis not present

## 2022-04-01 DIAGNOSIS — N250 Renal osteodystrophy: Secondary | ICD-10-CM | POA: Diagnosis not present

## 2022-04-01 DIAGNOSIS — D631 Anemia in chronic kidney disease: Secondary | ICD-10-CM | POA: Diagnosis not present

## 2022-04-01 DIAGNOSIS — Z992 Dependence on renal dialysis: Secondary | ICD-10-CM | POA: Diagnosis not present

## 2022-04-01 DIAGNOSIS — D509 Iron deficiency anemia, unspecified: Secondary | ICD-10-CM | POA: Diagnosis not present

## 2022-04-02 DIAGNOSIS — H524 Presbyopia: Secondary | ICD-10-CM | POA: Diagnosis not present

## 2022-04-02 DIAGNOSIS — H2513 Age-related nuclear cataract, bilateral: Secondary | ICD-10-CM | POA: Diagnosis not present

## 2022-04-02 DIAGNOSIS — H401134 Primary open-angle glaucoma, bilateral, indeterminate stage: Secondary | ICD-10-CM | POA: Diagnosis not present

## 2022-04-02 DIAGNOSIS — E113293 Type 2 diabetes mellitus with mild nonproliferative diabetic retinopathy without macular edema, bilateral: Secondary | ICD-10-CM | POA: Diagnosis not present

## 2022-04-03 DIAGNOSIS — Z992 Dependence on renal dialysis: Secondary | ICD-10-CM | POA: Diagnosis not present

## 2022-04-03 DIAGNOSIS — N186 End stage renal disease: Secondary | ICD-10-CM | POA: Diagnosis not present

## 2022-04-03 DIAGNOSIS — N2581 Secondary hyperparathyroidism of renal origin: Secondary | ICD-10-CM | POA: Diagnosis not present

## 2022-04-03 DIAGNOSIS — D509 Iron deficiency anemia, unspecified: Secondary | ICD-10-CM | POA: Diagnosis not present

## 2022-04-03 DIAGNOSIS — D631 Anemia in chronic kidney disease: Secondary | ICD-10-CM | POA: Diagnosis not present

## 2022-04-03 DIAGNOSIS — N250 Renal osteodystrophy: Secondary | ICD-10-CM | POA: Diagnosis not present

## 2022-04-05 DIAGNOSIS — N2581 Secondary hyperparathyroidism of renal origin: Secondary | ICD-10-CM | POA: Diagnosis not present

## 2022-04-05 DIAGNOSIS — Z992 Dependence on renal dialysis: Secondary | ICD-10-CM | POA: Diagnosis not present

## 2022-04-05 DIAGNOSIS — D631 Anemia in chronic kidney disease: Secondary | ICD-10-CM | POA: Diagnosis not present

## 2022-04-05 DIAGNOSIS — N250 Renal osteodystrophy: Secondary | ICD-10-CM | POA: Diagnosis not present

## 2022-04-05 DIAGNOSIS — N186 End stage renal disease: Secondary | ICD-10-CM | POA: Diagnosis not present

## 2022-04-05 DIAGNOSIS — D509 Iron deficiency anemia, unspecified: Secondary | ICD-10-CM | POA: Diagnosis not present

## 2022-04-08 DIAGNOSIS — D509 Iron deficiency anemia, unspecified: Secondary | ICD-10-CM | POA: Diagnosis not present

## 2022-04-08 DIAGNOSIS — N2581 Secondary hyperparathyroidism of renal origin: Secondary | ICD-10-CM | POA: Diagnosis not present

## 2022-04-08 DIAGNOSIS — D631 Anemia in chronic kidney disease: Secondary | ICD-10-CM | POA: Diagnosis not present

## 2022-04-08 DIAGNOSIS — Z992 Dependence on renal dialysis: Secondary | ICD-10-CM | POA: Diagnosis not present

## 2022-04-08 DIAGNOSIS — N250 Renal osteodystrophy: Secondary | ICD-10-CM | POA: Diagnosis not present

## 2022-04-08 DIAGNOSIS — N186 End stage renal disease: Secondary | ICD-10-CM | POA: Diagnosis not present

## 2022-04-10 DIAGNOSIS — N250 Renal osteodystrophy: Secondary | ICD-10-CM | POA: Diagnosis not present

## 2022-04-10 DIAGNOSIS — N186 End stage renal disease: Secondary | ICD-10-CM | POA: Diagnosis not present

## 2022-04-10 DIAGNOSIS — N2581 Secondary hyperparathyroidism of renal origin: Secondary | ICD-10-CM | POA: Diagnosis not present

## 2022-04-10 DIAGNOSIS — D631 Anemia in chronic kidney disease: Secondary | ICD-10-CM | POA: Diagnosis not present

## 2022-04-10 DIAGNOSIS — Z992 Dependence on renal dialysis: Secondary | ICD-10-CM | POA: Diagnosis not present

## 2022-04-10 DIAGNOSIS — D509 Iron deficiency anemia, unspecified: Secondary | ICD-10-CM | POA: Diagnosis not present

## 2022-04-12 DIAGNOSIS — N250 Renal osteodystrophy: Secondary | ICD-10-CM | POA: Diagnosis not present

## 2022-04-12 DIAGNOSIS — D631 Anemia in chronic kidney disease: Secondary | ICD-10-CM | POA: Diagnosis not present

## 2022-04-12 DIAGNOSIS — N186 End stage renal disease: Secondary | ICD-10-CM | POA: Diagnosis not present

## 2022-04-12 DIAGNOSIS — Z992 Dependence on renal dialysis: Secondary | ICD-10-CM | POA: Diagnosis not present

## 2022-04-12 DIAGNOSIS — N2581 Secondary hyperparathyroidism of renal origin: Secondary | ICD-10-CM | POA: Diagnosis not present

## 2022-04-12 DIAGNOSIS — D509 Iron deficiency anemia, unspecified: Secondary | ICD-10-CM | POA: Diagnosis not present

## 2022-04-15 DIAGNOSIS — N2581 Secondary hyperparathyroidism of renal origin: Secondary | ICD-10-CM | POA: Diagnosis not present

## 2022-04-15 DIAGNOSIS — Z992 Dependence on renal dialysis: Secondary | ICD-10-CM | POA: Diagnosis not present

## 2022-04-15 DIAGNOSIS — N186 End stage renal disease: Secondary | ICD-10-CM | POA: Diagnosis not present

## 2022-04-15 DIAGNOSIS — D509 Iron deficiency anemia, unspecified: Secondary | ICD-10-CM | POA: Diagnosis not present

## 2022-04-15 DIAGNOSIS — D631 Anemia in chronic kidney disease: Secondary | ICD-10-CM | POA: Diagnosis not present

## 2022-04-15 DIAGNOSIS — N250 Renal osteodystrophy: Secondary | ICD-10-CM | POA: Diagnosis not present

## 2022-04-17 DIAGNOSIS — N186 End stage renal disease: Secondary | ICD-10-CM | POA: Diagnosis not present

## 2022-04-17 DIAGNOSIS — Z992 Dependence on renal dialysis: Secondary | ICD-10-CM | POA: Diagnosis not present

## 2022-04-17 DIAGNOSIS — D631 Anemia in chronic kidney disease: Secondary | ICD-10-CM | POA: Diagnosis not present

## 2022-04-17 DIAGNOSIS — N2581 Secondary hyperparathyroidism of renal origin: Secondary | ICD-10-CM | POA: Diagnosis not present

## 2022-04-17 DIAGNOSIS — D509 Iron deficiency anemia, unspecified: Secondary | ICD-10-CM | POA: Diagnosis not present

## 2022-04-17 DIAGNOSIS — N250 Renal osteodystrophy: Secondary | ICD-10-CM | POA: Diagnosis not present

## 2022-04-19 DIAGNOSIS — N186 End stage renal disease: Secondary | ICD-10-CM | POA: Diagnosis not present

## 2022-04-19 DIAGNOSIS — D631 Anemia in chronic kidney disease: Secondary | ICD-10-CM | POA: Diagnosis not present

## 2022-04-19 DIAGNOSIS — D509 Iron deficiency anemia, unspecified: Secondary | ICD-10-CM | POA: Diagnosis not present

## 2022-04-19 DIAGNOSIS — N2581 Secondary hyperparathyroidism of renal origin: Secondary | ICD-10-CM | POA: Diagnosis not present

## 2022-04-19 DIAGNOSIS — Z992 Dependence on renal dialysis: Secondary | ICD-10-CM | POA: Diagnosis not present

## 2022-04-19 DIAGNOSIS — N250 Renal osteodystrophy: Secondary | ICD-10-CM | POA: Diagnosis not present

## 2022-04-21 ENCOUNTER — Non-Acute Institutional Stay (SKILLED_NURSING_FACILITY): Payer: Medicare Other | Admitting: Adult Health

## 2022-04-21 ENCOUNTER — Encounter: Payer: Self-pay | Admitting: Adult Health

## 2022-04-21 DIAGNOSIS — F321 Major depressive disorder, single episode, moderate: Secondary | ICD-10-CM

## 2022-04-21 DIAGNOSIS — E1122 Type 2 diabetes mellitus with diabetic chronic kidney disease: Secondary | ICD-10-CM

## 2022-04-21 DIAGNOSIS — D631 Anemia in chronic kidney disease: Secondary | ICD-10-CM

## 2022-04-21 DIAGNOSIS — N186 End stage renal disease: Secondary | ICD-10-CM | POA: Diagnosis not present

## 2022-04-21 DIAGNOSIS — Z992 Dependence on renal dialysis: Secondary | ICD-10-CM | POA: Diagnosis not present

## 2022-04-21 DIAGNOSIS — Z794 Long term (current) use of insulin: Secondary | ICD-10-CM | POA: Diagnosis not present

## 2022-04-21 DIAGNOSIS — E1151 Type 2 diabetes mellitus with diabetic peripheral angiopathy without gangrene: Secondary | ICD-10-CM | POA: Diagnosis not present

## 2022-04-21 DIAGNOSIS — M2142 Flat foot [pes planus] (acquired), left foot: Secondary | ICD-10-CM | POA: Diagnosis not present

## 2022-04-21 DIAGNOSIS — B351 Tinea unguium: Secondary | ICD-10-CM | POA: Diagnosis not present

## 2022-04-21 NOTE — Progress Notes (Signed)
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 He continues to get out of bed daily. There are no reports of anxiety or or depressive thoughts.   Past Medical History:  Diagnosis Date   Abnormal CT scan, kidney 10/06/2011   Acute pyelonephritis 10/07/2011   Anemia    normocytic   Anxiety    mental retardation   Bladder wall thickening 10/06/2011   BPH (benign prostatic hypertrophy)    Diabetes mellitus    Dialysis patient ([place])    Tuesday, Thursday and Saturday,    DVT of leg (deep venous thrombosis) ([place]) 12/25/2016   Edema     history of lower extremity edema   GERD (gastroesophageal reflux disease)    Heme positive stool    Hydronephrosis    Hyperkalemia    Hyperlipidemia    Hypernatremia    Hypertension    Hypothyroidism    Impaired speech    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[place])    MR (mental retardation)    Muscle weakness    Obstructive uropathy    Perinephric abscess 10/07/2011   Poor historian poor historian   Primary colorectal adenocarcinoma ([place]) 04/2017   S/p total colectomy   Protein calorie malnutrition ([place])    Pyelonephritis    Renal failure (ARF), acute on chronic (HCC)     Renal insufficiency    chronic history   Sepsis ([place])    Smoking    Uremia    Urinary retention    UTI (lower urinary tract infection) 10/06/2011    Past Surgical History:  Procedure Laterality Date   A/V FISTULAGRAM N/A 08/13/2018   Procedure: A/V FISTULAGRAM - Right Upper;  Surgeon: Elam Dutch, MD;  Location: [facility];  Service: Cardiovascular;  Laterality: N/A;   A/V FISTULAGRAM N/A 11/22/2018   Procedure: A/V FISTULAGRAM - Right Upper;  Surgeon: Waynetta Sandy, MD;  Location: [place] [facility] [facility];  Service: Cardiovascular;  Laterality: N/A;   AV FISTULA PLACEMENT Left 07/06/2015   Procedure:  INSERTION LEFT ARM ARTERIOVENOUS GORTEX GRAFT;  Surgeon: Angelia Mould, MD;  Location: [place];  Service: Vascular;  Laterality: Left;   AV FISTULA PLACEMENT Right 02/26/2016   Procedure: ARTERIOVENOUS (AV) FISTULA CREATION ;  Surgeon: Angelia Mould, MD;  Location: [place];  Service: Vascular;  Laterality: Right;   AV FISTULA PLACEMENT Right 11/25/2018   Procedure: INSERTION OF ARTERIOVENOUS (AV) ARTEGRAFT RIGHT UPPER ARM;  Surgeon: Waynetta Sandy, MD;  Location: [place];  Service: Vascular;  Laterality: Right;   AV FISTULA  PLACEMENT Left 05/28/2021   Procedure: LEFT ARM ARTERIOVENOUS (AV) FISTULA;  Surgeon: Rosetta Posner, MD;  [facility]: [facility];  Service: Vascular;  Laterality: Left;   [place] Left 10/09/2015   Procedure: REMOVAL OF ARTERIOVENOUS GORETEX GRAFT ([place]) Evacuation of Lymphocele, Vein Patch angioplasty of brachial artery.;  Surgeon: Angelia Mould, MD;  Location: [place];  Service: Vascular;  Laterality: Left;   [place] Right 02/26/2016   Procedure: Right [place];  Surgeon: Angelia Mould, MD;  Location: [place];  Service: Vascular;  Laterality: Right;   [place] Left 07/30/2021   Procedure: LEFT ARM SECOND STAGE BASILIC VEIN TRANSPOSITION;  Surgeon: Rosetta Posner,  MD;  Location: [facility];  Service: Vascular;  Laterality: Left;   CIRCUMCISION N/A 01/04/2014   Procedure: CIRCUMCISION ADULT (procedure #1);  Surgeon: Marissa Nestle, MD;  Location[facility];  Service: Urology;  Laterality: N/A;   COLECTOMY N/A 05/04/2017   Procedure: TOTAL COLECTOMY;  Surgeon: Aviva Signs, MD;  Location: [facility];  Service: General;  Laterality: N/A;   COLONOSCOPY N/A 04/27/2017   Surgeon: Daneil Dolin, MD; annular mass in the ascending colon likely representing cancer biopsied, multiple 6-22 mm polyps removed, clean rectum.  Pathology with multiple tubular adenomas, high-grade dysplasia noted in ascending colon and splenic flexure.   CYSTOSCOPY W/ RETROGRADES Bilateral 06/29/2015   Procedure: CYSTOSCOPY, DILATION OF URETHRAL STRICTURE WITH BILATERAL RETROGRADE PYELOGRAM,SUPRAPUBIC TUBE CHANGE;  Surgeon: Festus Aloe, MD;  Location: [facility];  Service: Urology;  Laterality: Bilateral;   CYSTOSCOPY WITH URETHRAL DILATATION N/A 12/29/2013   Procedure: CYSTOSCOPY WITH URETHRAL DILATATION;  Surgeon: Marissa Nestle, MD;  [facility];  Service: Urology;  Laterality: N/A;   ESOPHAGOGASTRODUODENOSCOPY N/A 04/27/2017   Procedure: ESOPHAGOGASTRODUODENOSCOPY (EGD);  Surgeon: Daneil Dolin, MD;  [facility];  Service: Endoscopy;  Laterality: N/A;   FLEXIBLE SIGMOIDOSCOPY N/A 12/02/2021   Procedure: FLEXIBLE SIGMOIDOSCOPY;  Surgeon: Eloise Harman, DO;  [facility];  Service: Endoscopy;  Laterality: N/A;  10:30am, dialysis patient   INSERTION OF DIALYSIS CATHETER Right 11/25/2018   Procedure: INSERTION OF DIALYSIS CATHETER RIGHT INTERNAL JUGULAR;  Surgeon: Waynetta Sandy, MD;  Location: [place];  Service: Vascular;  Laterality: Right;   IR AV DIALY SHUNT INTRO NEEDLE/INTRACATH INITIAL W/PTA/IMG RIGHT Right 09/07/2018   IR FLUORO GUIDE CV LINE RIGHT  10/16/2020   IR REMOVAL TUN CV CATH W/O FL  01/12/2019   IR THROMBECTOMY AV FISTULA  W/THROMBOLYSIS/PTA INC/SHUNT/IMG RIGHT Right 04/26/2018   IR US GUIDE VASC ACCESS RIGHT  04/26/2018   IR US GUIDE VASC ACCESS RIGHT  09/07/2018   IR US GUIDE VASC ACCESS RIGHT  10/16/2020   IR US GUIDE VASC ACCESS RIGHT  10/16/2020   ORIF FEMUR FRACTURE Right 11/22/2016   Procedure: OPEN REDUCTION INTERNAL FIXATION (ORIF) DISTAL FEMUR FRACTURE;  Surgeon: Rod Can, MD;  Location: [place];  Service: Orthopedics;  Laterality: Right;   PATCH ANGIOPLASTY Right 12/10/2017   Procedure: PATCH ANGIOPLASTY;  Surgeon: Angelia Mould, MD;  Location: [facility];  Service: Vascular;  Laterality: Right;   PERIPHERAL VASCULAR BALLOON ANGIOPLASTY  08/13/2018   Procedure: PERIPHERAL VASCULAR BALLOON ANGIOPLASTY;  Surgeon: Elam Dutch, MD;  Location: [place] [facility];  Service: Cardiovascular;;  right AV fistula    PERIPHERAL VASCULAR BALLOON ANGIOPLASTY  11/22/2018   Procedure: PERIPHERAL VASCULAR BALLOON ANGIOPLASTY;  Surgeon: Waynetta Sandy, MD;  Location: [place] [facility];  Service: Cardiovascular;;  rt AV fistula   PERIPHERAL VASCULAR CATHETERIZATION N/A 10/08/2015   Procedure: A/V Shuntogram;  Surgeon: Angelia Mould, MD;  Location: [place] [facility];  Service: Cardiovascular;  Laterality: N/A;   REMOVAL OF A DIALYSIS CATHETER N/A 12/03/2021   Procedure: MINOR REMOVAL OF A TUNNELED DIALYSIS CATHETER;  Surgeon: Rosetta Posner, MD;  [facility];  Service: Vascular;  Laterality: N/A;   THROMBECTOMY W/ EMBOLECTOMY Right 12/10/2017   Procedure: THROMBECTOMY REVISION RIGHT ARM  ARTERIOVENOUS FISTULA;  Surgeon: Angelia Mould, MD;  Location[facility];  Service: Vascular;  Laterality: Right;   TRANSURETHRAL RESECTION OF PROSTATE N/A 01/04/2014   Procedure: TRANSURETHRAL RESECTION OF THE PROSTATE (TURP) (procedure #2);  Surgeon: Marissa Nestle, MD;  [facility];  Service: Urology;  Laterality: N/A;    Social History   Socioeconomic History   Marital status:  Single    Spouse name: Not on file   Number of children: Not on file   Years of education: Not on file   Highest education level: Not on file  Occupational History   Occupation: retired   Tobacco Use   Smoking status: Never   Smokeless tobacco: Never  Vaping Use   Vaping Use: Never used  Substance and Sexual Activity   Alcohol use: No   Drug use: No   Sexual activity: Not Currently  Other Topics Concern   Not on file  Social History Narrative   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   [facility]    Social Determinants of Health   Financial Resource Strain: Low Risk  (02/18/2018)   Overall Financial Resource Strain (CARDIA)    Difficulty of Paying Living Expenses: Not hard at all  Food Insecurity: No Food Insecurity (02/18/2018)   Hunger Vital Sign    Worried About Running Out of Food in the Last Year: Never true    Ran Out of Food in the Last Year: Never true  Transportation Needs: No Transportation Needs (02/18/2018)   PRAPARE - Hydrologist (Medical): No    Lack of Transportation (Non-Medical): No  Physical Activity: Unknown (09/15/2019)   Exercise Vital Sign    Days of Exercise per Week: Not on file    Minutes of Exercise per Session: 0 min  Stress: No Stress Concern Present (02/18/2018)   [place]    Feeling of Stress : Not at all  Social Connections: Unknown (09/15/2019)   Social Connection and Isolation Panel [NHANES]    Frequency of Communication with Friends and Family: Not on file    Frequency of Social Gatherings with Friends and Family: Never    Attends Religious Services: Not on Diplomatic Services operational officer of Clubs or Organizations: Not on file    Attends Archivist Meetings: Not on file    Marital Status: Not on file  Intimate Partner Violence: Not At Risk (02/18/2018)   Humiliation, Afraid, Rape, and Kick  questionnaire    Fear of Current or Ex-Partner: No    Emotionally Abused: No    Physically Abused: No    Sexually Abused: No   Family History  Problem Relation Age of Onset   Cancer Mother    Colon cancer Neg Hx       VITAL SIGNS BP 113/60   Pulse 70   Temp (!) 97.5 F (36.4 C)   Resp 18   Ht '5\' 8"'$  (  1.727 m)   Wt 211 lb 3.2 oz (95.8 kg)   SpO2 96%   BMI 32.11 kg/m   Outpatient Encounter Medications as of 04/21/2022  Medication Sig   aspirin EC 81 MG tablet Take 81 mg by mouth daily. 0900   atorvastatin (LIPITOR) 20 MG tablet Take 20 mg by mouth at bedtime. 2000   Insulin Glargine (BASAGLAR KWIKPEN) 100 UNIT/ML SOPN Inject 20 Units into the skin at bedtime. 2100 DO NOT HOLD this insulin without provider notification   Insulin Pen Needle (BD AUTOSHIELD DUO) 30G X 5 MM MISC by Does not apply route. 3/16"   levothyroxine (SYNTHROID) 125 MCG tablet Take 125 mcg by mouth at bedtime. 2100   midodrine (PROAMATINE) 10 MG tablet Take 10 mg by mouth See admin instructions. Send med with resident on  Tues/Thurs/Sat to dialysis. do not give [facility], give to resident to take with him. send with '5mg'$  to equal '15mg'$ . 6:00   midodrine (PROAMATINE) 5 MG tablet Take 5 mg by mouth See admin instructions. Send with resident on dialysis days.  Tues/Thurs/Sat  Take along with 10 mg to equal 15 mg 6:00   NON FORMULARY Diet: NAS, Cons CHO   omega-3 acid ethyl esters (LOVAZA) 1 g capsule Take 2 g by mouth at bedtime. 2100   omeprazole (PRILOSEC) 40 MG capsule Take 40 mg by mouth daily. 0900   polyethylene glycol (MIRALAX / GLYCOLAX) packet Take 17 g by mouth daily as needed for mild constipation.    sevelamer carbonate (RENVELA) 800 MG tablet Take 2,400 mg by mouth 3 (three) times daily with meals. 0900, 1200, 1700 Twice A Day; 12:00 PM, 05:00 PM   tamsulosin (FLOMAX) 0.4 MG CAPS capsule Take 0.4 mg by mouth daily at 6 PM. Give 30 minutes after a meal. Do not crush or chew   torsemide (DEMADEX)  20 MG tablet Take 20 mg by mouth as directed. Sunday, Monday, Wednesday, and Friday   vitamin B-12 (CYANOCOBALAMIN) 1000 MCG tablet Take 1,000 mcg by mouth daily.   No facility-administered encounter medications on file as of 04/21/2022.     SIGNIFICANT DIAGNOSTIC EXAMS   PREVIOUS   05-02-21: Chest x-ray: dialysis cath demonstrated on right. No acute cardiopulmonary process  NO NEW EXAMS.    LABS REVIEWED PREVIOUS;    05-02-21: wbc 3.8; hgb 10.9; hct 33.7; mcv 105.6 plt 106; glucose 105; bun 17; creat 7.94; k+ 3.8; na++ 138; ca 9.1; GFR 7; d-dimer: 0.95; CRP 0.7 06-12-21: tsh 5.269  06-27-21: hgb a1c 5.6 07-30-21: hgb 11.6; hct 34.0; glucose 104; bun 12; creat 7.30; k+ 5.2; na++ 135; ionized ca++ 0.8  08-12-21: tsh 0.822 11-07-21: wbc 4,5 hgb 9.5; hct 29.5; mcv 103.9 plt 107; glucose 129; bun 28; creat 9.01; k+ 4.5; na++ 138; ca 8.6; GFR 6; liver normal albumin 3.1; hgb a1c 5.8; chol 60 ldl 18; trig 71; hdl 28; tsh 1.538  11-22-21: hgb 9,3; hct 28.4; iron 80; tibc 203; ferritin 474; vitamin B 12: 272 folate 6.6  12-02-21: hgb 10.5; hct 31.0; glucose 85; bun 25; creat 9.80; k+ 3.9; na++ 140;   01-28-22; wbc 4.8; hgb 8.7; hct 26.0; mcv 105.3; plt 113; glucose 1; bun 40; creat 11.35; k+ 3.8; na++ 141; ca 8.8; GFR 4 d-dimer 0.43; CRP <0.5   NO NEW LABS.   Review of Systems  Constitutional:  Negative for malaise/fatigue.  Respiratory:  Negative for cough and shortness of breath.   Cardiovascular:  Negative for chest pain, palpitations and leg  swelling.  Gastrointestinal:  Negative for abdominal pain, constipation and heartburn.  Musculoskeletal:  Negative for back pain, joint pain and myalgias.  Skin: Negative.   Neurological:  Negative for dizziness.  Psychiatric/Behavioral:  The patient is not nervous/anxious.    Physical Exam Constitutional:      General: He is not in acute distress.    Appearance: He is well-developed. He is obese. He is not diaphoretic.  Neck:     Thyroid: No  thyromegaly.  Cardiovascular:     Rate and Rhythm: Normal rate and regular rhythm.     Pulses: Normal pulses.     Heart sounds: Normal heart sounds.  Pulmonary:     Effort: Pulmonary effort is normal. No respiratory distress.     Breath sounds: Normal breath sounds.  Abdominal:     General: Bowel sounds are normal. There is no distension.     Palpations: Abdomen is soft.     Tenderness: There is no abdominal tenderness.  Musculoskeletal:        General: Normal range of motion.     Cervical back: Neck supple.     Right lower leg: No edema.     Left lower leg: No edema.  Lymphadenopathy:     Cervical: No cervical adenopathy.  Skin:    General: Skin is warm and dry.     Comments:  Left arm fistula + bruit + thrill    Neurological:     Mental Status: He is alert. Mental status is at baseline.  Psychiatric:        Mood and Affect: Mood normal.      ASSESSMENT/ PLAN:  TODAY  End stage renal disease on hemodialysis due to type 2 diabetes mellitus/hemodialysis  dependent: remains on hemodialysis three days weekly; on 1200 cc fluid restriction; does not adhere; take midodrine 15 mg with dialysis renvela 1600 mg with meals and 800 mg with snacks  2. Other specified hypothyroidism: tsh 0.822 will continue synthroid 125 mcg daily   3. Anemia of chronic renal disease on chronic dialysis hgb 8.7; hct 26.0.   4. Major depression single episode moderate: is presently off medications will monitor   PREVIOUS   5. GERD without esophagitis: will continue prilosec 40 mg daily   6. History of colon cancer: s/p colectomy last seen on 11-18-21.  7. Dyslipidemia associated with type 2 diabetes mellitus: LDL 64; trig 319; will continue lipitor 80 mg daily and lovaza 2 gm daily   8. Thrombocytopenia: platelet 113.   9, aortic atherosclerosis (ct 12-30-21) is on statin      Ok Edwards NP [facility]  call [phone]

## 2022-04-22 DIAGNOSIS — N250 Renal osteodystrophy: Secondary | ICD-10-CM | POA: Diagnosis not present

## 2022-04-22 DIAGNOSIS — D509 Iron deficiency anemia, unspecified: Secondary | ICD-10-CM | POA: Diagnosis not present

## 2022-04-22 DIAGNOSIS — D631 Anemia in chronic kidney disease: Secondary | ICD-10-CM | POA: Diagnosis not present

## 2022-04-22 DIAGNOSIS — N186 End stage renal disease: Secondary | ICD-10-CM | POA: Diagnosis not present

## 2022-04-22 DIAGNOSIS — Z992 Dependence on renal dialysis: Secondary | ICD-10-CM | POA: Diagnosis not present

## 2022-04-22 DIAGNOSIS — N2581 Secondary hyperparathyroidism of renal origin: Secondary | ICD-10-CM | POA: Diagnosis not present

## 2022-04-24 DIAGNOSIS — Z992 Dependence on renal dialysis: Secondary | ICD-10-CM | POA: Diagnosis not present

## 2022-04-24 DIAGNOSIS — N186 End stage renal disease: Secondary | ICD-10-CM | POA: Diagnosis not present

## 2022-04-24 DIAGNOSIS — N250 Renal osteodystrophy: Secondary | ICD-10-CM | POA: Diagnosis not present

## 2022-04-24 DIAGNOSIS — D509 Iron deficiency anemia, unspecified: Secondary | ICD-10-CM | POA: Diagnosis not present

## 2022-04-24 DIAGNOSIS — N2581 Secondary hyperparathyroidism of renal origin: Secondary | ICD-10-CM | POA: Diagnosis not present

## 2022-04-24 DIAGNOSIS — D631 Anemia in chronic kidney disease: Secondary | ICD-10-CM | POA: Diagnosis not present

## 2022-04-25 DIAGNOSIS — Z992 Dependence on renal dialysis: Secondary | ICD-10-CM | POA: Diagnosis not present

## 2022-04-25 DIAGNOSIS — N186 End stage renal disease: Secondary | ICD-10-CM | POA: Diagnosis not present

## 2022-04-26 DIAGNOSIS — N186 End stage renal disease: Secondary | ICD-10-CM | POA: Diagnosis not present

## 2022-04-26 DIAGNOSIS — N250 Renal osteodystrophy: Secondary | ICD-10-CM | POA: Diagnosis not present

## 2022-04-26 DIAGNOSIS — Z992 Dependence on renal dialysis: Secondary | ICD-10-CM | POA: Diagnosis not present

## 2022-04-26 DIAGNOSIS — N2581 Secondary hyperparathyroidism of renal origin: Secondary | ICD-10-CM | POA: Diagnosis not present

## 2022-04-26 DIAGNOSIS — D509 Iron deficiency anemia, unspecified: Secondary | ICD-10-CM | POA: Diagnosis not present

## 2022-04-26 DIAGNOSIS — D631 Anemia in chronic kidney disease: Secondary | ICD-10-CM | POA: Diagnosis not present

## 2022-04-29 DIAGNOSIS — N250 Renal osteodystrophy: Secondary | ICD-10-CM | POA: Diagnosis not present

## 2022-04-29 DIAGNOSIS — D509 Iron deficiency anemia, unspecified: Secondary | ICD-10-CM | POA: Diagnosis not present

## 2022-04-29 DIAGNOSIS — N186 End stage renal disease: Secondary | ICD-10-CM | POA: Diagnosis not present

## 2022-04-29 DIAGNOSIS — D631 Anemia in chronic kidney disease: Secondary | ICD-10-CM | POA: Diagnosis not present

## 2022-04-29 DIAGNOSIS — N2581 Secondary hyperparathyroidism of renal origin: Secondary | ICD-10-CM | POA: Diagnosis not present

## 2022-04-29 DIAGNOSIS — Z992 Dependence on renal dialysis: Secondary | ICD-10-CM | POA: Diagnosis not present

## 2022-05-01 DIAGNOSIS — Z992 Dependence on renal dialysis: Secondary | ICD-10-CM | POA: Diagnosis not present

## 2022-05-01 DIAGNOSIS — D631 Anemia in chronic kidney disease: Secondary | ICD-10-CM | POA: Diagnosis not present

## 2022-05-01 DIAGNOSIS — N250 Renal osteodystrophy: Secondary | ICD-10-CM | POA: Diagnosis not present

## 2022-05-01 DIAGNOSIS — N186 End stage renal disease: Secondary | ICD-10-CM | POA: Diagnosis not present

## 2022-05-01 DIAGNOSIS — N2581 Secondary hyperparathyroidism of renal origin: Secondary | ICD-10-CM | POA: Diagnosis not present

## 2022-05-01 DIAGNOSIS — D509 Iron deficiency anemia, unspecified: Secondary | ICD-10-CM | POA: Diagnosis not present

## 2022-05-03 DIAGNOSIS — N186 End stage renal disease: Secondary | ICD-10-CM | POA: Diagnosis not present

## 2022-05-03 DIAGNOSIS — N2581 Secondary hyperparathyroidism of renal origin: Secondary | ICD-10-CM | POA: Diagnosis not present

## 2022-05-03 DIAGNOSIS — N250 Renal osteodystrophy: Secondary | ICD-10-CM | POA: Diagnosis not present

## 2022-05-03 DIAGNOSIS — D631 Anemia in chronic kidney disease: Secondary | ICD-10-CM | POA: Diagnosis not present

## 2022-05-03 DIAGNOSIS — Z992 Dependence on renal dialysis: Secondary | ICD-10-CM | POA: Diagnosis not present

## 2022-05-03 DIAGNOSIS — D509 Iron deficiency anemia, unspecified: Secondary | ICD-10-CM | POA: Diagnosis not present

## 2022-05-06 DIAGNOSIS — D509 Iron deficiency anemia, unspecified: Secondary | ICD-10-CM | POA: Diagnosis not present

## 2022-05-06 DIAGNOSIS — N186 End stage renal disease: Secondary | ICD-10-CM | POA: Diagnosis not present

## 2022-05-06 DIAGNOSIS — E119 Type 2 diabetes mellitus without complications: Secondary | ICD-10-CM | POA: Diagnosis not present

## 2022-05-06 DIAGNOSIS — Z992 Dependence on renal dialysis: Secondary | ICD-10-CM | POA: Diagnosis not present

## 2022-05-06 DIAGNOSIS — N250 Renal osteodystrophy: Secondary | ICD-10-CM | POA: Diagnosis not present

## 2022-05-06 DIAGNOSIS — D631 Anemia in chronic kidney disease: Secondary | ICD-10-CM | POA: Diagnosis not present

## 2022-05-06 DIAGNOSIS — N2581 Secondary hyperparathyroidism of renal origin: Secondary | ICD-10-CM | POA: Diagnosis not present

## 2022-05-08 ENCOUNTER — Encounter: Payer: Self-pay | Admitting: Internal Medicine

## 2022-05-08 ENCOUNTER — Non-Acute Institutional Stay (SKILLED_NURSING_FACILITY): Payer: Medicare Other | Admitting: Internal Medicine

## 2022-05-08 DIAGNOSIS — Z992 Dependence on renal dialysis: Secondary | ICD-10-CM | POA: Diagnosis not present

## 2022-05-08 DIAGNOSIS — D509 Iron deficiency anemia, unspecified: Secondary | ICD-10-CM

## 2022-05-08 DIAGNOSIS — E538 Deficiency of other specified B group vitamins: Secondary | ICD-10-CM

## 2022-05-08 DIAGNOSIS — N186 End stage renal disease: Secondary | ICD-10-CM | POA: Diagnosis not present

## 2022-05-08 DIAGNOSIS — N2581 Secondary hyperparathyroidism of renal origin: Secondary | ICD-10-CM

## 2022-05-08 DIAGNOSIS — E1122 Type 2 diabetes mellitus with diabetic chronic kidney disease: Secondary | ICD-10-CM

## 2022-05-08 DIAGNOSIS — D631 Anemia in chronic kidney disease: Secondary | ICD-10-CM | POA: Diagnosis not present

## 2022-05-08 DIAGNOSIS — N250 Renal osteodystrophy: Secondary | ICD-10-CM | POA: Diagnosis not present

## 2022-05-08 NOTE — Progress Notes (Signed)
NURSING HOME LOCATION:  [facility] ROOM NUMBER: 112 D  CODE STATUS: Full code  PCP:  Ok Edwards NP  This is a nursing facility follow up visit of chronic medical diagnoses & to document compliance with Regulation 483.30 (c) in [employer] Manual Phase 2 which mandates caregiver visit ( visits can alternate among physician, PA or NP as per statutes) within 10 days of 30 days / 60 days/ 90 days post admission to SNF date    Interim medical record and care since last SNF visit was updated with review of diagnostic studies and change in clinical status since last visit were documented.  HPI: He is a permanent resident of this facility with medical diagnoses of essential hypertension, diabetes complicated by ESRD on hemodialysis, aortic atherosclerosis, history of colon cancer, GERD, hypothyroidism, dyslipidemia, thrombocytopenia, and anemia with history of vitamin B12 as well as iron deficiency.  Epic labs were reviewed.  In April of this year GFR was 4 and creatinine 11.35.  Macrocytic anemia was present with H/H of 8.7/26.  B12 level was low normal at 272 on 11/22/2021.   Most current A1c on record in Epic was 5.8% on 11/07/2021.  This is essentially stable. Serially. TSH was therapeutic on that date with a value of 1.538. Labs are checked at dialysis periodically.  On 5/9 phosphorus was 6.1; he is on Renvela.  The goal was to keep phosphorus below 5.5 to improve secondary hyperparathyroidism.  His PTH intact was 602 on 5/9.  Albumin was 3.6.  Hemoglobin had improved to 10.1. A1c had been repeated at dialysis 4/18 and was 5.7%.  Review of systems: He denies any active symptoms; he replied negatively to all ROS queries.  He did not eat his lunch upon return from dialysis; but he denies anorexia or any active GI symptoms.  Constitutional: No fever, significant weight change, fatigue  Eyes: No redness, discharge, pain, vision change ENT/mouth: No nasal  congestion,  purulent discharge, earache, change in hearing, sore throat  Cardiovascular: No chest pain, palpitations, paroxysmal nocturnal dyspnea, claudication, edema  Respiratory: No cough, sputum production, hemoptysis, DOE, significant snoring, apnea   Gastrointestinal: No heartburn, dysphagia, abdominal pain, nausea /vomiting, rectal bleeding, melena, change in bowels Genitourinary: No dysuria, hematuria, pyuria, incontinence, nocturia Musculoskeletal: No joint stiffness, joint swelling, weakness, pain Dermatologic: No rash, pruritus, change in appearance of skin Neurologic: No dizziness, headache, syncope, seizures, numbness, tingling Psychiatric: No significant anxiety, depression, insomnia, anorexia Endocrine: No change in hair/skin/nails, excessive thirst, excessive hunger, excessive urination  Hematologic/lymphatic: No significant bruising, lymphadenopathy, abnormal bleeding Allergy/immunology: No itchy/watery eyes, significant sneezing, urticaria, angioedema  Physical exam:  Pertinent or positive findings: He was mobilizing in the hallway in his wheelchair when I found him.  He appears younger than his stated age.  He clinically is somewhat hard of hearing.  He exhibits dysarthria.  Intermittently there is minimal exotropia of the left eye.  There is ptosis on the right.  Slight arcus senilis is suggested.  He is missing maxillary teeth and most of the teeth are encrusted with plaque.  Slight gallop cadence is present.  Abdomen is protuberant.  Posterior tibial pulses are stronger than dorsalis pedis pulses.  There is a wartlike growth over the DIP area of the right thumb dorsally.  The right thumbnail is dark.  Vascular access sites present bilaterally over the biceps.  General appearance: Adequately nourished; no acute distress, increased work of breathing is present.   Lymphatic: No lymphadenopathy  about the head, neck, axilla. Eyes: No conjunctival inflammation or lid edema is  present. There is no scleral icterus. Ears:  External ear exam shows no significant lesions or deformities.   Nose:  External nasal examination shows no deformity or inflammation. Nasal mucosa are pink and moist without lesions, exudates Neck:  No thyromegaly, masses, tenderness noted.    Heart:  No murmur, click, rub .  Lungs: Chest clear to auscultation without wheezes, rhonchi, rales, rubs. Abdomen: Bowel sounds are normal. Abdomen is soft and nontender with no organomegaly, hernias, masses. GU: Deferred  Extremities:  No cyanosis, clubbing, edema  Neurologic exam :Balance, Rhomberg, finger to nose testing could not be completed due to clinical state Skin: Warm & dry w/o tenting. No significant rash.  See summary under each active problem in the Problem List with associated updated therapeutic plan

## 2022-05-08 NOTE — Assessment & Plan Note (Signed)
He continues to be on hemodialysis 3 times a week.  Most recent A1c was 5.7% on 4/18 indicating prediabetes.

## 2022-05-08 NOTE — Assessment & Plan Note (Addendum)
03/04/2022 PTH intact 602.  Phosphorus 6.1.  He is on Renvela with a goal of phosphorus less than 5.5 to help improve the secondary hyperparathyroidism

## 2022-05-08 NOTE — Patient Instructions (Signed)
See assessment and plan under each diagnosis in the problem list and acutely for this visit 

## 2022-05-08 NOTE — Assessment & Plan Note (Signed)
03/04/2022 hemoglobin had improved to 10.1 and iron saturation was normal at 35% with normal range 20-50%.

## 2022-05-08 NOTE — Assessment & Plan Note (Signed)
In January B12 level was low normal at 272.  This can be updated at next SNF blood draw.

## 2022-05-10 DIAGNOSIS — N186 End stage renal disease: Secondary | ICD-10-CM | POA: Diagnosis not present

## 2022-05-10 DIAGNOSIS — N250 Renal osteodystrophy: Secondary | ICD-10-CM | POA: Diagnosis not present

## 2022-05-10 DIAGNOSIS — N2581 Secondary hyperparathyroidism of renal origin: Secondary | ICD-10-CM | POA: Diagnosis not present

## 2022-05-10 DIAGNOSIS — D631 Anemia in chronic kidney disease: Secondary | ICD-10-CM | POA: Diagnosis not present

## 2022-05-10 DIAGNOSIS — Z992 Dependence on renal dialysis: Secondary | ICD-10-CM | POA: Diagnosis not present

## 2022-05-10 DIAGNOSIS — D509 Iron deficiency anemia, unspecified: Secondary | ICD-10-CM | POA: Diagnosis not present

## 2022-05-13 DIAGNOSIS — N250 Renal osteodystrophy: Secondary | ICD-10-CM | POA: Diagnosis not present

## 2022-05-13 DIAGNOSIS — Z992 Dependence on renal dialysis: Secondary | ICD-10-CM | POA: Diagnosis not present

## 2022-05-13 DIAGNOSIS — D631 Anemia in chronic kidney disease: Secondary | ICD-10-CM | POA: Diagnosis not present

## 2022-05-13 DIAGNOSIS — D509 Iron deficiency anemia, unspecified: Secondary | ICD-10-CM | POA: Diagnosis not present

## 2022-05-13 DIAGNOSIS — N2581 Secondary hyperparathyroidism of renal origin: Secondary | ICD-10-CM | POA: Diagnosis not present

## 2022-05-13 DIAGNOSIS — N186 End stage renal disease: Secondary | ICD-10-CM | POA: Diagnosis not present

## 2022-05-15 DIAGNOSIS — N2581 Secondary hyperparathyroidism of renal origin: Secondary | ICD-10-CM | POA: Diagnosis not present

## 2022-05-15 DIAGNOSIS — Z992 Dependence on renal dialysis: Secondary | ICD-10-CM | POA: Diagnosis not present

## 2022-05-15 DIAGNOSIS — D631 Anemia in chronic kidney disease: Secondary | ICD-10-CM | POA: Diagnosis not present

## 2022-05-15 DIAGNOSIS — D509 Iron deficiency anemia, unspecified: Secondary | ICD-10-CM | POA: Diagnosis not present

## 2022-05-15 DIAGNOSIS — N250 Renal osteodystrophy: Secondary | ICD-10-CM | POA: Diagnosis not present

## 2022-05-15 DIAGNOSIS — N186 End stage renal disease: Secondary | ICD-10-CM | POA: Diagnosis not present

## 2022-05-17 DIAGNOSIS — N2581 Secondary hyperparathyroidism of renal origin: Secondary | ICD-10-CM | POA: Diagnosis not present

## 2022-05-17 DIAGNOSIS — Z992 Dependence on renal dialysis: Secondary | ICD-10-CM | POA: Diagnosis not present

## 2022-05-17 DIAGNOSIS — D631 Anemia in chronic kidney disease: Secondary | ICD-10-CM | POA: Diagnosis not present

## 2022-05-17 DIAGNOSIS — N250 Renal osteodystrophy: Secondary | ICD-10-CM | POA: Diagnosis not present

## 2022-05-17 DIAGNOSIS — N186 End stage renal disease: Secondary | ICD-10-CM | POA: Diagnosis not present

## 2022-05-17 DIAGNOSIS — D509 Iron deficiency anemia, unspecified: Secondary | ICD-10-CM | POA: Diagnosis not present

## 2022-05-20 DIAGNOSIS — N186 End stage renal disease: Secondary | ICD-10-CM | POA: Diagnosis not present

## 2022-05-20 DIAGNOSIS — N250 Renal osteodystrophy: Secondary | ICD-10-CM | POA: Diagnosis not present

## 2022-05-20 DIAGNOSIS — D631 Anemia in chronic kidney disease: Secondary | ICD-10-CM | POA: Diagnosis not present

## 2022-05-20 DIAGNOSIS — D509 Iron deficiency anemia, unspecified: Secondary | ICD-10-CM | POA: Diagnosis not present

## 2022-05-20 DIAGNOSIS — Z992 Dependence on renal dialysis: Secondary | ICD-10-CM | POA: Diagnosis not present

## 2022-05-20 DIAGNOSIS — N2581 Secondary hyperparathyroidism of renal origin: Secondary | ICD-10-CM | POA: Diagnosis not present

## 2022-05-22 DIAGNOSIS — N186 End stage renal disease: Secondary | ICD-10-CM | POA: Diagnosis not present

## 2022-05-22 DIAGNOSIS — D509 Iron deficiency anemia, unspecified: Secondary | ICD-10-CM | POA: Diagnosis not present

## 2022-05-22 DIAGNOSIS — Z992 Dependence on renal dialysis: Secondary | ICD-10-CM | POA: Diagnosis not present

## 2022-05-22 DIAGNOSIS — N2581 Secondary hyperparathyroidism of renal origin: Secondary | ICD-10-CM | POA: Diagnosis not present

## 2022-05-22 DIAGNOSIS — N250 Renal osteodystrophy: Secondary | ICD-10-CM | POA: Diagnosis not present

## 2022-05-22 DIAGNOSIS — D631 Anemia in chronic kidney disease: Secondary | ICD-10-CM | POA: Diagnosis not present

## 2022-05-24 DIAGNOSIS — N250 Renal osteodystrophy: Secondary | ICD-10-CM | POA: Diagnosis not present

## 2022-05-24 DIAGNOSIS — Z992 Dependence on renal dialysis: Secondary | ICD-10-CM | POA: Diagnosis not present

## 2022-05-24 DIAGNOSIS — D631 Anemia in chronic kidney disease: Secondary | ICD-10-CM | POA: Diagnosis not present

## 2022-05-24 DIAGNOSIS — D509 Iron deficiency anemia, unspecified: Secondary | ICD-10-CM | POA: Diagnosis not present

## 2022-05-24 DIAGNOSIS — N2581 Secondary hyperparathyroidism of renal origin: Secondary | ICD-10-CM | POA: Diagnosis not present

## 2022-05-24 DIAGNOSIS — N186 End stage renal disease: Secondary | ICD-10-CM | POA: Diagnosis not present

## 2022-05-26 DIAGNOSIS — Z992 Dependence on renal dialysis: Secondary | ICD-10-CM | POA: Diagnosis not present

## 2022-05-26 DIAGNOSIS — N186 End stage renal disease: Secondary | ICD-10-CM | POA: Diagnosis not present

## 2022-05-27 DIAGNOSIS — N2581 Secondary hyperparathyroidism of renal origin: Secondary | ICD-10-CM | POA: Diagnosis not present

## 2022-05-27 DIAGNOSIS — Z992 Dependence on renal dialysis: Secondary | ICD-10-CM | POA: Diagnosis not present

## 2022-05-27 DIAGNOSIS — N186 End stage renal disease: Secondary | ICD-10-CM | POA: Diagnosis not present

## 2022-05-27 DIAGNOSIS — N250 Renal osteodystrophy: Secondary | ICD-10-CM | POA: Diagnosis not present

## 2022-05-27 DIAGNOSIS — D509 Iron deficiency anemia, unspecified: Secondary | ICD-10-CM | POA: Diagnosis not present

## 2022-05-29 DIAGNOSIS — N186 End stage renal disease: Secondary | ICD-10-CM | POA: Diagnosis not present

## 2022-05-29 DIAGNOSIS — N250 Renal osteodystrophy: Secondary | ICD-10-CM | POA: Diagnosis not present

## 2022-05-29 DIAGNOSIS — D509 Iron deficiency anemia, unspecified: Secondary | ICD-10-CM | POA: Diagnosis not present

## 2022-05-29 DIAGNOSIS — Z992 Dependence on renal dialysis: Secondary | ICD-10-CM | POA: Diagnosis not present

## 2022-05-29 DIAGNOSIS — N2581 Secondary hyperparathyroidism of renal origin: Secondary | ICD-10-CM | POA: Diagnosis not present

## 2022-05-31 DIAGNOSIS — N250 Renal osteodystrophy: Secondary | ICD-10-CM | POA: Diagnosis not present

## 2022-05-31 DIAGNOSIS — N2581 Secondary hyperparathyroidism of renal origin: Secondary | ICD-10-CM | POA: Diagnosis not present

## 2022-05-31 DIAGNOSIS — N186 End stage renal disease: Secondary | ICD-10-CM | POA: Diagnosis not present

## 2022-05-31 DIAGNOSIS — D509 Iron deficiency anemia, unspecified: Secondary | ICD-10-CM | POA: Diagnosis not present

## 2022-05-31 DIAGNOSIS — Z992 Dependence on renal dialysis: Secondary | ICD-10-CM | POA: Diagnosis not present

## 2022-06-03 DIAGNOSIS — Z992 Dependence on renal dialysis: Secondary | ICD-10-CM | POA: Diagnosis not present

## 2022-06-03 DIAGNOSIS — N186 End stage renal disease: Secondary | ICD-10-CM | POA: Diagnosis not present

## 2022-06-03 DIAGNOSIS — N2581 Secondary hyperparathyroidism of renal origin: Secondary | ICD-10-CM | POA: Diagnosis not present

## 2022-06-03 DIAGNOSIS — D509 Iron deficiency anemia, unspecified: Secondary | ICD-10-CM | POA: Diagnosis not present

## 2022-06-03 DIAGNOSIS — N250 Renal osteodystrophy: Secondary | ICD-10-CM | POA: Diagnosis not present

## 2022-06-05 DIAGNOSIS — N2581 Secondary hyperparathyroidism of renal origin: Secondary | ICD-10-CM | POA: Diagnosis not present

## 2022-06-05 DIAGNOSIS — Z992 Dependence on renal dialysis: Secondary | ICD-10-CM | POA: Diagnosis not present

## 2022-06-05 DIAGNOSIS — N250 Renal osteodystrophy: Secondary | ICD-10-CM | POA: Diagnosis not present

## 2022-06-05 DIAGNOSIS — D509 Iron deficiency anemia, unspecified: Secondary | ICD-10-CM | POA: Diagnosis not present

## 2022-06-05 DIAGNOSIS — N186 End stage renal disease: Secondary | ICD-10-CM | POA: Diagnosis not present

## 2022-06-07 DIAGNOSIS — N2581 Secondary hyperparathyroidism of renal origin: Secondary | ICD-10-CM | POA: Diagnosis not present

## 2022-06-07 DIAGNOSIS — N250 Renal osteodystrophy: Secondary | ICD-10-CM | POA: Diagnosis not present

## 2022-06-07 DIAGNOSIS — D509 Iron deficiency anemia, unspecified: Secondary | ICD-10-CM | POA: Diagnosis not present

## 2022-06-07 DIAGNOSIS — Z992 Dependence on renal dialysis: Secondary | ICD-10-CM | POA: Diagnosis not present

## 2022-06-07 DIAGNOSIS — N186 End stage renal disease: Secondary | ICD-10-CM | POA: Diagnosis not present

## 2022-06-10 DIAGNOSIS — N2581 Secondary hyperparathyroidism of renal origin: Secondary | ICD-10-CM | POA: Diagnosis not present

## 2022-06-10 DIAGNOSIS — N250 Renal osteodystrophy: Secondary | ICD-10-CM | POA: Diagnosis not present

## 2022-06-10 DIAGNOSIS — D509 Iron deficiency anemia, unspecified: Secondary | ICD-10-CM | POA: Diagnosis not present

## 2022-06-10 DIAGNOSIS — N186 End stage renal disease: Secondary | ICD-10-CM | POA: Diagnosis not present

## 2022-06-10 DIAGNOSIS — Z992 Dependence on renal dialysis: Secondary | ICD-10-CM | POA: Diagnosis not present

## 2022-06-12 DIAGNOSIS — D509 Iron deficiency anemia, unspecified: Secondary | ICD-10-CM | POA: Diagnosis not present

## 2022-06-12 DIAGNOSIS — Z992 Dependence on renal dialysis: Secondary | ICD-10-CM | POA: Diagnosis not present

## 2022-06-12 DIAGNOSIS — N250 Renal osteodystrophy: Secondary | ICD-10-CM | POA: Diagnosis not present

## 2022-06-12 DIAGNOSIS — N2581 Secondary hyperparathyroidism of renal origin: Secondary | ICD-10-CM | POA: Diagnosis not present

## 2022-06-12 DIAGNOSIS — N186 End stage renal disease: Secondary | ICD-10-CM | POA: Diagnosis not present

## 2022-06-14 DIAGNOSIS — Z992 Dependence on renal dialysis: Secondary | ICD-10-CM | POA: Diagnosis not present

## 2022-06-14 DIAGNOSIS — N186 End stage renal disease: Secondary | ICD-10-CM | POA: Diagnosis not present

## 2022-06-14 DIAGNOSIS — N2581 Secondary hyperparathyroidism of renal origin: Secondary | ICD-10-CM | POA: Diagnosis not present

## 2022-06-14 DIAGNOSIS — N250 Renal osteodystrophy: Secondary | ICD-10-CM | POA: Diagnosis not present

## 2022-06-14 DIAGNOSIS — D509 Iron deficiency anemia, unspecified: Secondary | ICD-10-CM | POA: Diagnosis not present

## 2022-06-17 ENCOUNTER — Encounter: Payer: Self-pay | Admitting: Adult Health

## 2022-06-17 ENCOUNTER — Non-Acute Institutional Stay (SKILLED_NURSING_FACILITY): Payer: Medicare Other | Admitting: Adult Health

## 2022-06-17 DIAGNOSIS — N250 Renal osteodystrophy: Secondary | ICD-10-CM | POA: Diagnosis not present

## 2022-06-17 DIAGNOSIS — K219 Gastro-esophageal reflux disease without esophagitis: Secondary | ICD-10-CM

## 2022-06-17 DIAGNOSIS — Z992 Dependence on renal dialysis: Secondary | ICD-10-CM | POA: Diagnosis not present

## 2022-06-17 DIAGNOSIS — E785 Hyperlipidemia, unspecified: Secondary | ICD-10-CM | POA: Diagnosis not present

## 2022-06-17 DIAGNOSIS — D509 Iron deficiency anemia, unspecified: Secondary | ICD-10-CM | POA: Diagnosis not present

## 2022-06-17 DIAGNOSIS — N186 End stage renal disease: Secondary | ICD-10-CM | POA: Diagnosis not present

## 2022-06-17 DIAGNOSIS — Z85038 Personal history of other malignant neoplasm of large intestine: Secondary | ICD-10-CM

## 2022-06-17 DIAGNOSIS — E1169 Type 2 diabetes mellitus with other specified complication: Secondary | ICD-10-CM

## 2022-06-17 DIAGNOSIS — N2581 Secondary hyperparathyroidism of renal origin: Secondary | ICD-10-CM | POA: Diagnosis not present

## 2022-06-17 NOTE — Progress Notes (Unsigned)
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[place])    Tuesday, Thursday and Saturday,    DVT of leg (deep venous thrombosis) ([place]) 12/25/2016   Edema     history of lower extremity edema   GERD (gastroesophageal reflux disease)    Heme positive stool    Hydronephrosis    Hyperkalemia    Hyperlipidemia    Hypernatremia    Hypertension    Hypothyroidism    Impaired speech    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[place])    MR (mental retardation)    Muscle weakness    Obstructive uropathy    Perinephric abscess 10/07/2011   Poor historian poor historian   Primary colorectal adenocarcinoma ([place]) 04/2017   S/p total colectomy   Protein calorie malnutrition ([place])    Pyelonephritis    Renal failure (ARF), acute on chronic ([place])    Renal insufficiency    chronic history   Sepsis ([place])    Smoking    Uremia    Urinary retention    UTI (lower urinary tract infection) 10/06/2011    Past Surgical History:  Procedure Laterality Date   A/V FISTULAGRAM N/A 08/13/2018   Procedure: A/V FISTULAGRAM - Right Upper;  Surgeon: Elam Dutch, MD;  Location: [facility];  Service: Cardiovascular;  Laterality: N/A;   A/V FISTULAGRAM N/A 11/22/2018   Procedure: A/V FISTULAGRAM - Right Upper;  Surgeon: Waynetta Sandy, MD;  Location: [facility];  Service: Cardiovascular;  Laterality: N/A;   AV FISTULA PLACEMENT Left 07/06/2015   Procedure:   INSERTION LEFT ARM ARTERIOVENOUS GORTEX GRAFT;  Surgeon: Angelia Mould, MD;  Location: [facility];  Service: Vascular;  Laterality: Left;   AV FISTULA PLACEMENT Right 02/26/2016   Procedure: ARTERIOVENOUS (AV) FISTULA CREATION ;  Surgeon: Angelia Mould, MD;  Location: [facility];  Service: Vascular;  Laterality: Right;   AV FISTULA PLACEMENT Right 11/25/2018   Procedure: INSERTION OF ARTERIOVENOUS (AV) ARTEGRAFT RIGHT UPPER ARM;  Surgeon: Waynetta Sandy, MD;  Location: [facility];  Service: Vascular;  Laterality: Right;   AV FISTULA PLACEMENT Left 05/28/2021   Procedure: LEFT ARM ARTERIOVENOUS (AV) FISTULA;  Surgeon: Rosetta Posner, MD;  [facility];  Service: Vascular;  Laterality: Left;   [place] REMOVAL Left 10/09/2015   Procedure: REMOVAL OF ARTERIOVENOUS GORETEX GRAFT ([place]) Evacuation of Lymphocele, Vein Patch angioplasty of brachial artery.;  Surgeon: Angelia Mould, MD;  Location: [facility];  Service: Vascular;  Laterality: Left;   [place] Right 02/26/2016   Procedure: Right [place];  Surgeon: Angelia Mould, MD;  Location: [facility];  Service: Vascular;  Laterality: Right;   [place] Left 07/30/2021   Procedure: LEFT ARM SECOND STAGE BASILIC VEIN TRANSPOSITION;  Surgeon: Rosetta Posner,  MD;  [facility];  Service: Vascular;  Laterality: Left;   CIRCUMCISION N/A 01/04/2014   Procedure: CIRCUMCISION ADULT (procedure #1);  Surgeon: Marissa Nestle, MD;  [facility];  Service: Urology;  Laterality: N/A;   COLECTOMY N/A 05/04/2017   Procedure: TOTAL COLECTOMY;  Surgeon: Aviva Signs, MD;  [facility];  Service: General;  Laterality: N/A;   COLONOSCOPY N/A 04/27/2017   Surgeon: Daneil Dolin, MD; annular mass in the ascending colon likely representing cancer biopsied, multiple 6-22 mm polyps removed, clean rectum.  Pathology with multiple tubular adenomas, high-grade dysplasia noted in ascending  colon and splenic flexure.   CYSTOSCOPY W/ RETROGRADES Bilateral 06/29/2015   Procedure: CYSTOSCOPY, DILATION OF URETHRAL STRICTURE WITH BILATERAL RETROGRADE PYELOGRAM,SUPRAPUBIC TUBE CHANGE;  Surgeon: Festus Aloe, MD;  [facility];  Service: Urology;  Laterality: Bilateral;   CYSTOSCOPY WITH URETHRAL DILATATION N/A 12/29/2013   Procedure: CYSTOSCOPY WITH URETHRAL DILATATION;  Surgeon: Marissa Nestle, MD;  [facility];  Service: Urology;  Laterality: N/A;   ESOPHAGOGASTRODUODENOSCOPY N/A 04/27/2017   Procedure: ESOPHAGOGASTRODUODENOSCOPY (EGD);  Surgeon: Daneil Dolin, MD;  [facility];  Service: Endoscopy;  Laterality: N/A;   FLEXIBLE SIGMOIDOSCOPY N/A 12/02/2021   Procedure: FLEXIBLE SIGMOIDOSCOPY;  Surgeon: Eloise Harman, DO;  [facility];  Service: Endoscopy;  Laterality: N/A;  10:30am, dialysis patient   INSERTION OF DIALYSIS CATHETER Right 11/25/2018   Procedure: INSERTION OF DIALYSIS CATHETER RIGHT INTERNAL JUGULAR;  Surgeon: Waynetta Sandy, MD;  Location: [facility];  Service: Vascular;  Laterality: Right;   IR AV DIALY SHUNT INTRO NEEDLE/INTRACATH INITIAL W/PTA/IMG RIGHT Right 09/07/2018   IR FLUORO GUIDE CV LINE RIGHT  10/16/2020   IR REMOVAL TUN CV CATH W/O FL  01/12/2019   IR THROMBECTOMY AV FISTULA W/THROMBOLYSIS/PTA INC/SHUNT/IMG RIGHT Right 04/26/2018   IR US GUIDE VASC ACCESS RIGHT  04/26/2018   IR US GUIDE VASC ACCESS RIGHT  09/07/2018   IR US GUIDE VASC ACCESS RIGHT  10/16/2020   IR US GUIDE VASC ACCESS RIGHT  10/16/2020   ORIF FEMUR FRACTURE Right 11/22/2016   Procedure: OPEN REDUCTION INTERNAL FIXATION (ORIF) DISTAL FEMUR FRACTURE;  Surgeon: Rod Can, MD;  Location: [facility];  Service: Orthopedics;  Laterality: Right;   PATCH ANGIOPLASTY Right 12/10/2017   Procedure: PATCH ANGIOPLASTY;  Surgeon: Angelia Mould, MD;  Location: [facility];  Service: Vascular;  Laterality: Right;   PERIPHERAL VASCULAR BALLOON  ANGIOPLASTY  08/13/2018   Procedure: PERIPHERAL VASCULAR BALLOON ANGIOPLASTY;  Surgeon: Elam Dutch, MD;  Location: [facility];  Service: Cardiovascular;;  right AV fistula    PERIPHERAL VASCULAR BALLOON ANGIOPLASTY  11/22/2018   Procedure: PERIPHERAL VASCULAR BALLOON ANGIOPLASTY;  Surgeon: Waynetta Sandy, MD;  Location: [facility];  Service: Cardiovascular;;  rt AV fistula   PERIPHERAL VASCULAR CATHETERIZATION N/A 10/08/2015   Procedure: A/V Shuntogram;  Surgeon: Angelia Mould, MD;  Location: [facility];  Service: Cardiovascular;  Laterality: N/A;   REMOVAL OF A DIALYSIS CATHETER N/A 12/03/2021   Procedure: MINOR REMOVAL OF A TUNNELED DIALYSIS CATHETER;  Surgeon: Rosetta Posner, MD;  [facility];  Service: Vascular;  Laterality: N/A;   THROMBECTOMY W/ EMBOLECTOMY Right 12/10/2017   Procedure: THROMBECTOMY REVISION RIGHT ARM  ARTERIOVENOUS FISTULA;  Surgeon: Angelia Mould, MD;  Location: [facility];  Service: Vascular;  Laterality: Right;   TRANSURETHRAL RESECTION OF PROSTATE N/A 01/04/2014   Procedure: TRANSURETHRAL RESECTION OF THE PROSTATE (TURP) (procedure #  2);  Surgeon: Marissa Nestle, MD;  [facility];  Service: Urology;  Laterality: N/A;    Social History   Socioeconomic History   Marital status: Single    Spouse name: Not on file   Number of children: Not on file   Years of education: Not on file   Highest education level: Not on file  Occupational History   Occupation: retired   Tobacco Use   Smoking status: Never   Smokeless tobacco: Never  Vaping Use   Vaping Use: Never used  Substance and Sexual Activity   Alcohol use: No   Drug use: No   Sexual activity: Not Currently  Other Topics Concern   Not on file  Social History Narrative   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   [facility] resident of [facility]    Social Determinants of Health   Financial Resource Strain: Low  Risk  (02/18/2018)   Overall Financial Resource Strain (CARDIA)    Difficulty of Paying Living Expenses: Not hard at all  Food Insecurity: No Food Insecurity (02/18/2018)   Hunger Vital Sign    Worried About Running Out of Food in the Last Year: Never true    Ran Out of Food in the Last Year: Never true  Transportation Needs: No Transportation Needs (02/18/2018)   PRAPARE - Hydrologist (Medical): No    Lack of Transportation (Non-Medical): No  Physical Activity: Unknown (09/15/2019)   Exercise Vital Sign    Days of Exercise per Week: Not on file    Minutes of Exercise per Session: 0 min  Stress: No Stress Concern Present (02/18/2018)   [place]    Feeling of Stress : Not at all  Social Connections: Unknown (09/15/2019)   Social Connection and Isolation Panel [NHANES]    Frequency of Communication with Friends and Family: Not on file    Frequency of Social Gatherings with Friends and Family: Never    Attends Religious Services: Not on file    Active Member of Clubs or Organizations: Not on file    Attends Archivist Meetings: Not on file    Marital Status: Not on file  Intimate Partner Violence: Not At Risk (02/18/2018)   Humiliation, Afraid, Rape, and Kick questionnaire    Fear of Current or Ex-Partner: No    Emotionally Abused: No    Physically Abused: No    Sexually Abused: No   Family History  Problem Relation Age of Onset   Cancer Mother    Colon cancer Neg Hx       VITAL SIGNS BP 127/77   Pulse 73   Temp (!) 97.4 F (36.3 C)   Resp 20   Ht '5\' 8"'$  (1.727 m)   Wt 211 lb 6.4 oz (95.9 kg)   SpO2 95%   BMI 32.14 kg/m   Outpatient Encounter Medications as of 06/17/2022  Medication Sig   aspirin EC 81 MG tablet Take 81 mg by mouth daily. 0900   atorvastatin (LIPITOR) 20 MG tablet Take 20 mg by mouth at bedtime. 2000   Insulin Glargine (BASAGLAR KWIKPEN) 100  UNIT/ML SOPN Inject 20 Units into the skin at bedtime. 2100 DO NOT HOLD this insulin without provider notification   Insulin Pen Needle (BD AUTOSHIELD DUO) 30G X 5 MM MISC by Does not apply route. 3/16"   levothyroxine (SYNTHROID) 125 MCG tablet Take 125 mcg by mouth at bedtime.  2100   midodrine (PROAMATINE) 10 MG tablet Take 10 mg by mouth See admin instructions. Send med with resident on  Tues/Thurs/Sat to dialysis. do not give [facility], give to resident to take with him. send with '5mg'$  to equal '15mg'$ . 6:00   midodrine (PROAMATINE) 5 MG tablet Take 5 mg by mouth See admin instructions. Send with resident on dialysis days.  Tues/Thurs/Sat  Take along with 10 mg to equal 15 mg 6:00   NON FORMULARY Diet: NAS, Cons CHO   omega-3 acid ethyl esters (LOVAZA) 1 g capsule Take 2 g by mouth at bedtime. 2100   omeprazole (PRILOSEC) 40 MG capsule Take 40 mg by mouth daily. 0900   polyethylene glycol (MIRALAX / GLYCOLAX) packet Take 17 g by mouth daily as needed for mild constipation.    sevelamer carbonate (RENVELA) 800 MG tablet Take 2,400 mg by mouth in the morning and at bedtime.   sevelamer carbonate (RENVELA) 800 MG tablet Take 2,400 mg by mouth daily.   tamsulosin (FLOMAX) 0.4 MG CAPS capsule Take 0.4 mg by mouth daily at 6 PM. Give 30 minutes after a meal. Do not crush or chew   torsemide (DEMADEX) 20 MG tablet Take 20 mg by mouth as directed. Sunday, Monday, Wednesday, and Friday   vitamin B-12 (CYANOCOBALAMIN) 1000 MCG tablet Take 1,000 mcg by mouth daily.   No facility-administered encounter medications on file as of 06/17/2022.     SIGNIFICANT DIAGNOSTIC EXAMS       ASSESSMENT/ PLAN:     Ok Edwards NP [facility]  Contact [phone] Monday through Friday 8am- 5pm  After hours call [phone]  This encounter was created in error - please disregard.

## 2022-06-17 NOTE — Progress Notes (Signed)
Location:  [facility] Room Number: 2 D Place of Service:  SNF (31) Provider:  Ok Edwards, NP   CODE STATUS: FULL CODE  No Known Allergies  Chief Complaint  Patient presents with   Medical Management of Chronic Issues                        GERD without esophagitis:  History of colon cancer:  Dyslipidemia associated with type 2 diabetes mellitus    HPI:  He is a 76 year [name] long term resident of this facility being seen for the management of her chronic illnesses: GERD without esophagitis:  History of colon cancer:  Dyslipidemia associated with type 2 diabetes mellitus. There are no reports of uncontrolled pain; he continues with dialysis three times weekly.   Past Medical History:  Diagnosis Date   Abnormal CT scan, kidney 10/06/2011   Acute pyelonephritis 10/07/2011   Anemia    normocytic   Anxiety    mental retardation   Bladder wall thickening 10/06/2011   BPH (benign prostatic hypertrophy)    Diabetes mellitus    Dialysis patient ([place])    Tuesday, Thursday and Saturday,    DVT of leg (deep venous thrombosis) ([place]) 12/25/2016   Edema     history of lower extremity edema   GERD (gastroesophageal reflux disease)    Heme positive stool    Hydronephrosis    Hyperkalemia    Hyperlipidemia    Hypernatremia    Hypertension    Hypothyroidism    Impaired speech    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[place])    MR (mental retardation)    Muscle weakness    Obstructive uropathy    Perinephric abscess 10/07/2011   Poor historian poor historian   Primary colorectal adenocarcinoma ([place]) 04/2017   S/p total colectomy   Protein calorie malnutrition ([place])    Pyelonephritis    Renal failure (ARF), acute on chronic (HCC)    Renal insufficiency    chronic history   Sepsis ([place])    Smoking    Uremia    Urinary retention    UTI (lower urinary tract infection) 10/06/2011    Past Surgical History:  Procedure Laterality Date   A/V FISTULAGRAM  N/A 08/13/2018   Procedure: A/V FISTULAGRAM - Right Upper;  Surgeon: Elam Dutch, MD;  Location: [facility];  Service: Cardiovascular;  Laterality: N/A;   A/V FISTULAGRAM N/A 11/22/2018   Procedure: A/V FISTULAGRAM - Right Upper;  Surgeon: Waynetta Sandy, MD;  Location: [facility];  Service: Cardiovascular;  Laterality: N/A;   AV FISTULA PLACEMENT Left 07/06/2015   Procedure:  INSERTION LEFT ARM ARTERIOVENOUS GORTEX GRAFT;  Surgeon: Angelia Mould, MD;  Location: [facility];  Service: Vascular;  Laterality: Left;   AV FISTULA PLACEMENT Right 02/26/2016   Procedure: ARTERIOVENOUS (AV) FISTULA CREATION ;  Surgeon: Angelia Mould, MD;  Location: [facility];  Service: Vascular;  Laterality: Right;   AV FISTULA PLACEMENT Right 11/25/2018   Procedure: INSERTION OF ARTERIOVENOUS (AV) ARTEGRAFT RIGHT UPPER ARM;  Surgeon: Waynetta Sandy, MD;  Location: [facility];  Service: Vascular;  Laterality: Right;   AV FISTULA PLACEMENT Left 05/28/2021   Procedure: LEFT ARM ARTERIOVENOUS (AV) FISTULA;  Surgeon: Rosetta Posner, MD;  [facility];  Service: Vascular;  Laterality: Left;   [place] Left 10/09/2015   Procedure: REMOVAL OF ARTERIOVENOUS GORETEX GRAFT ([place]) Evacuation of Lymphocele, Vein Patch angioplasty of  brachial artery.;  Surgeon: Angelia Mould, MD;  Location: [place];  Service: Vascular;  Laterality: Left;   [place] Right 02/26/2016   Procedure: Right [place];  Surgeon: Angelia Mould, MD;  Location: [place];  Service: Vascular;  Laterality: Right;   [place] Left 07/30/2021   Procedure: LEFT ARM SECOND STAGE BASILIC VEIN TRANSPOSITION;  Surgeon: Rosetta Posner, MD;  [facility];  Service: Vascular;  Laterality: Left;   CIRCUMCISION N/A 01/04/2014   Procedure: CIRCUMCISION ADULT (procedure #1);  Surgeon: Marissa Nestle, MD;  [facility];  Service: Urology;  Laterality:  N/A;   COLECTOMY N/A 05/04/2017   Procedure: TOTAL COLECTOMY;  Surgeon: Aviva Signs, MD;  Location[facility];  Service: General;  Laterality: N/A;   COLONOSCOPY N/A 04/27/2017   Surgeon: Daneil Dolin, MD; annular mass in the ascending colon likely representing cancer biopsied, multiple 6-22 mm polyps removed, clean rectum.  Pathology with multiple tubular adenomas, high-grade dysplasia noted in ascending colon and splenic flexure.   CYSTOSCOPY W/ RETROGRADES Bilateral 06/29/2015   Procedure: CYSTOSCOPY, DILATION OF URETHRAL STRICTURE WITH BILATERAL RETROGRADE PYELOGRAM,SUPRAPUBIC TUBE CHANGE;  Surgeon: Festus Aloe, MD;  [facility];  Service: Urology;  Laterality: Bilateral;   CYSTOSCOPY WITH URETHRAL DILATATION N/A 12/29/2013   Procedure: CYSTOSCOPY WITH URETHRAL DILATATION;  Surgeon: Marissa Nestle, MD;  [facility];  Service: Urology;  Laterality: N/A;   ESOPHAGOGASTRODUODENOSCOPY N/A 04/27/2017   Procedure: ESOPHAGOGASTRODUODENOSCOPY (EGD);  Surgeon: Daneil Dolin, MD;  [facility];  Service: Endoscopy;  Laterality: N/A;   FLEXIBLE SIGMOIDOSCOPY N/A 12/02/2021   Procedure: FLEXIBLE SIGMOIDOSCOPY;  Surgeon: Eloise Harman, DO;  [facility];  Service: Endoscopy;  Laterality: N/A;  10:30am, dialysis patient   INSERTION OF DIALYSIS CATHETER Right 11/25/2018   Procedure: INSERTION OF DIALYSIS CATHETER RIGHT INTERNAL JUGULAR;  Surgeon: Waynetta Sandy, MD;  Location: [facility];  Service: Vascular;  Laterality: Right;   IR AV DIALY SHUNT INTRO NEEDLE/INTRACATH INITIAL W/PTA/IMG RIGHT Right 09/07/2018   IR FLUORO GUIDE CV LINE RIGHT  10/16/2020   IR REMOVAL TUN CV CATH W/O FL  01/12/2019   IR THROMBECTOMY AV FISTULA W/THROMBOLYSIS/PTA INC/SHUNT/IMG RIGHT Right 04/26/2018   IR US GUIDE VASC ACCESS RIGHT  04/26/2018   IR US GUIDE VASC ACCESS RIGHT  09/07/2018   IR US GUIDE VASC ACCESS RIGHT  10/16/2020   IR US GUIDE VASC ACCESS RIGHT  10/16/2020    ORIF FEMUR FRACTURE Right 11/22/2016   Procedure: OPEN REDUCTION INTERNAL FIXATION (ORIF) DISTAL FEMUR FRACTURE;  Surgeon: Rod Can, MD;  Location: [facility];  Service: Orthopedics;  Laterality: Right;   PATCH ANGIOPLASTY Right 12/10/2017   Procedure: PATCH ANGIOPLASTY;  Surgeon: Angelia Mould, MD;  Location: [facility];  Service: Vascular;  Laterality: Right;   PERIPHERAL VASCULAR BALLOON ANGIOPLASTY  08/13/2018   Procedure: PERIPHERAL VASCULAR BALLOON ANGIOPLASTY;  Surgeon: Elam Dutch, MD;  Location: [facility];  Service: Cardiovascular;;  right AV fistula    PERIPHERAL VASCULAR BALLOON ANGIOPLASTY  11/22/2018   Procedure: PERIPHERAL VASCULAR BALLOON ANGIOPLASTY;  Surgeon: Waynetta Sandy, MD;  Location: [facility];  Service: Cardiovascular;;  rt AV fistula   PERIPHERAL VASCULAR CATHETERIZATION N/A 10/08/2015   Procedure: A/V Shuntogram;  Surgeon: Angelia Mould, MD;  Location: [facility];  Service: Cardiovascular;  Laterality: N/A;   REMOVAL OF A DIALYSIS CATHETER N/A 12/03/2021   Procedure: MINOR REMOVAL OF A  TUNNELED DIALYSIS CATHETER;  Surgeon: Rosetta Posner, MD;  [facility];  Service: Vascular;  Laterality: N/A;   THROMBECTOMY W/ EMBOLECTOMY Right 12/10/2017   Procedure: THROMBECTOMY REVISION RIGHT ARM  ARTERIOVENOUS FISTULA;  Surgeon: Angelia Mould, MD;  [facility];  Service: Vascular;  Laterality: Right;   TRANSURETHRAL RESECTION OF PROSTATE N/A 01/04/2014   Procedure: TRANSURETHRAL RESECTION OF THE PROSTATE (TURP) (procedure #2);  Surgeon: Marissa Nestle, MD;  [facility];  Service: Urology;  Laterality: N/A;    Social History   Socioeconomic History   Marital status: Single    Spouse name: Not on file   Number of children: Not on file   Years of education: Not on file   Highest education level: Not on file  Occupational History   Occupation: retired   Tobacco Use   Smoking status: Never    Smokeless tobacco: Never  Vaping Use   Vaping Use: Never used  Substance and Sexual Activity   Alcohol use: No   Drug use: No   Sexual activity: Not Currently  Other Topics Concern   Not on file  Social History Narrative   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   Long term resident of [facility]    Social Determinants of Health   Financial Resource Strain: Low Risk  (02/18/2018)   Overall Financial Resource Strain (CARDIA)    Difficulty of Paying Living Expenses: Not hard at all  Food Insecurity: No Food Insecurity (02/18/2018)   Hunger Vital Sign    Worried About Running Out of Food in the Last Year: Never true    Ran Out of Food in the Last Year: Never true  Transportation Needs: No Transportation Needs (02/18/2018)   PRAPARE - Hydrologist (Medical): No    Lack of Transportation (Non-Medical): No  Physical Activity: Unknown (09/15/2019)   Exercise Vital Sign    Days of Exercise per Week: Not on file    Minutes of Exercise per Session: 0 min  Stress: No Stress Concern Present (02/18/2018)   [place]    Feeling of Stress : Not at all  Social Connections: Unknown (09/15/2019)   Social Connection and Isolation Panel [NHANES]    Frequency of Communication with Friends and Family: Not on file    Frequency of Social Gatherings with Friends and Family: Never    Attends Religious Services: Not on file    Active Member of Clubs or Organizations: Not on file    Attends Archivist Meetings: Not on file    Marital Status: Not on file  Intimate Partner Violence: Not At Risk (02/18/2018)   Humiliation, Afraid, Rape, and Kick questionnaire    Fear of Current or Ex-Partner: No    Emotionally Abused: No    Physically Abused: No    Sexually Abused: No   Family History  Problem Relation Age of Onset   Cancer Mother    Colon cancer Neg Hx       VITAL  SIGNS BP 127/77   Pulse 73   Temp (!) 97.4 F (36.3 C)   Resp 20   Ht '5\' 8"'$  (1.727 m)   Wt 211 lb 6.4 oz (95.9 kg)   SpO2 95%   BMI 32.14 kg/m   Outpatient Encounter Medications as of 06/17/2022  Medication Sig   aspirin EC 81 MG tablet Take 81 mg by mouth daily. 0900   atorvastatin (LIPITOR)  20 MG tablet Take 20 mg by mouth at bedtime. 2000   Insulin Glargine (BASAGLAR KWIKPEN) 100 UNIT/ML SOPN Inject 20 Units into the skin at bedtime. 2100 DO NOT HOLD this insulin without provider notification   Insulin Pen Needle (BD AUTOSHIELD DUO) 30G X 5 MM MISC by Does not apply route. 3/16"   levothyroxine (SYNTHROID) 125 MCG tablet Take 125 mcg by mouth at bedtime. 2100   midodrine (PROAMATINE) 10 MG tablet Take 10 mg by mouth See admin instructions. Send med with resident on  Tues/Thurs/Sat to dialysis. do not give at [facility], give to resident to take with him. send with '5mg'$  to equal '15mg'$ . 6:00   midodrine (PROAMATINE) 5 MG tablet Take 5 mg by mouth See admin instructions. Send with resident on dialysis days.  Tues/Thurs/Sat  Take along with 10 mg to equal 15 mg 6:00   NON FORMULARY Diet: NAS, Cons CHO   omega-3 acid ethyl esters (LOVAZA) 1 g capsule Take 2 g by mouth at bedtime. 2100   omeprazole (PRILOSEC) 40 MG capsule Take 40 mg by mouth daily. 0900   polyethylene glycol (MIRALAX / GLYCOLAX) packet Take 17 g by mouth daily as needed for mild constipation.    sevelamer carbonate (RENVELA) 800 MG tablet Take 2,400 mg by mouth in the morning and at bedtime.   sevelamer carbonate (RENVELA) 800 MG tablet Take 2,400 mg by mouth daily.   tamsulosin (FLOMAX) 0.4 MG CAPS capsule Take 0.4 mg by mouth daily at 6 PM. Give 30 minutes after a meal. Do not crush or chew   torsemide (DEMADEX) 20 MG tablet Take 20 mg by mouth as directed. Sunday, Monday, Wednesday, and Friday   vitamin B-12 (CYANOCOBALAMIN) 1000 MCG tablet Take 1,000 mcg by mouth daily.   No facility-administered encounter medications  on file as of 06/17/2022.     SIGNIFICANT DIAGNOSTIC EXAMS  TODAY  06-11-22: dexa: t score: -2.052  NO NEW EXAMS.    LABS REVIEWED PREVIOUS;    06-12-21: tsh 5.269  06-27-21: hgb a1c 5.6 07-30-21: hgb 11.6; hct 34.0; glucose 104; bun 12; creat 7.30; k+ 5.2; na++ 135; ionized ca++ 0.8  08-12-21: tsh 0.822 11-07-21: wbc 4,5 hgb 9.5; hct 29.5; mcv 103.9 plt 107; glucose 129; bun 28; creat 9.01; k+ 4.5; na++ 138; ca 8.6; GFR 6; liver normal albumin 3.1; hgb a1c 5.8; chol 60 ldl 18; trig 71; hdl 28; tsh 1.538  11-22-21: hgb 9,3; hct 28.4; iron 80; tibc 203; ferritin 474; vitamin B 12: 272 folate 6.6  12-02-21: hgb 10.5; hct 31.0; glucose 85; bun 25; creat 9.80; k+ 3.9; na++ 140;   01-28-22; wbc 4.8; hgb 8.7; hct 26.0; mcv 105.3; plt 113; glucose 1; bun 40; creat 11.35; k+ 3.8; na++ 141; ca 8.8; GFR 4 d-dimer 0.43; CRP <0.5   NO NEW LABS.   Review of Systems  Constitutional:  Negative for malaise/fatigue.  Respiratory:  Negative for cough and shortness of breath.   Cardiovascular:  Negative for chest pain, palpitations and leg swelling.  Gastrointestinal:  Negative for abdominal pain, constipation and heartburn.  Musculoskeletal:  Negative for back pain, joint pain and myalgias.  Skin: Negative.   Neurological:  Negative for dizziness.  Psychiatric/Behavioral:  The patient is not nervous/anxious.    Physical Exam Constitutional:      General: He is not in acute distress.    Appearance: He is well-developed. He is obese. He is not diaphoretic.  Neck:     Thyroid: No thyromegaly.  Cardiovascular:  Rate and Rhythm: Normal rate and regular rhythm.     Pulses: Normal pulses.     Heart sounds: Normal heart sounds.  Pulmonary:     Effort: Pulmonary effort is normal. No respiratory distress.     Breath sounds: Normal breath sounds.  Abdominal:     General: Bowel sounds are normal. There is no distension.     Palpations: Abdomen is soft.     Tenderness: There is no abdominal tenderness.   Musculoskeletal:        General: Normal range of motion.     Cervical back: Neck supple.     Right lower leg: No edema.     Left lower leg: No edema.  Lymphadenopathy:     Cervical: No cervical adenopathy.  Skin:    General: Skin is warm and dry.     Comments:  Left arm fistula + bruit + thrill     Neurological:     Mental Status: He is alert. Mental status is at baseline.  Psychiatric:        Mood and Affect: Mood normal.      ASSESSMENT/ PLAN:  TODAY  GERD without esophagitis: will continue prilosec 40 mg daily   2. History of colon cancer: s/p colectomy last seen on 11-18-21.   3. Dyslipidemia associated with type 2 diabetes mellitus LDL 64; trig 319; will continue lipitor 80 mg daily and lovaza 2 gm daily    PREVIOUS   4. Thrombocytopenia: platelet 113.   5.  aortic atherosclerosis (ct 12-30-21) is on statin  6. End stage renal disease on hemodialysis due to type 2 diabetes mellitus/hemodialysis  dependent: remains on hemodialysis three days weekly; on 1200 cc fluid restriction; does not adhere; take midodrine 15 mg with dialysis renvela 1600 mg with meals and 800 mg with snacks  7. Other specified hypothyroidism: tsh 0.822 will continue synthroid 125 mcg daily   8. Anemia of chronic renal disease on chronic dialysis hgb 8.7; hct 26.0.   9. Major depression single episode moderate: is presently off medications will monitor  10. Osteopenia: t score: -2.052 will continue ca++ supplements and vitamin D supplement       Ok Edwards NP [facility]   call [phone]

## 2022-06-19 ENCOUNTER — Encounter (HOSPITAL_COMMUNITY)
Admission: RE | Admit: 2022-06-19 | Discharge: 2022-06-19 | Disposition: A | Discharge status: Home / Self Care | Payer: Medicare Other | Source: Skilled Nursing Facility | Attending: Adult Health | Admitting: Adult Health

## 2022-06-19 DIAGNOSIS — N250 Renal osteodystrophy: Secondary | ICD-10-CM | POA: Diagnosis not present

## 2022-06-19 DIAGNOSIS — D509 Iron deficiency anemia, unspecified: Secondary | ICD-10-CM | POA: Diagnosis not present

## 2022-06-19 DIAGNOSIS — E1129 Type 2 diabetes mellitus with other diabetic kidney complication: Secondary | ICD-10-CM | POA: Diagnosis not present

## 2022-06-19 DIAGNOSIS — N186 End stage renal disease: Secondary | ICD-10-CM | POA: Diagnosis not present

## 2022-06-19 DIAGNOSIS — Z992 Dependence on renal dialysis: Secondary | ICD-10-CM | POA: Diagnosis not present

## 2022-06-19 DIAGNOSIS — N2581 Secondary hyperparathyroidism of renal origin: Secondary | ICD-10-CM | POA: Diagnosis not present

## 2022-06-19 LAB — HEMOGLOBIN A1C
Hgb A1c MFr Bld: 5.8 % — ABNORMAL HIGH (ref 4.8–5.6)
Mean Plasma Glucose: 119.76 mg/dL

## 2022-06-19 LAB — TSH: TSH: 2.539 u[IU]/mL (ref 0.350–4.500)

## 2022-06-19 LAB — T4, FREE: Free T4: 0.98 ng/dL (ref 0.61–1.12)

## 2022-06-20 ENCOUNTER — Encounter: Payer: Self-pay | Admitting: Adult Health

## 2022-06-20 ENCOUNTER — Non-Acute Institutional Stay (SKILLED_NURSING_FACILITY): Payer: Medicare Other | Admitting: Adult Health

## 2022-06-20 DIAGNOSIS — I700 Atherosclerosis of aorta: Secondary | ICD-10-CM

## 2022-06-20 DIAGNOSIS — Z992 Dependence on renal dialysis: Secondary | ICD-10-CM

## 2022-06-20 DIAGNOSIS — E1122 Type 2 diabetes mellitus with diabetic chronic kidney disease: Secondary | ICD-10-CM | POA: Diagnosis not present

## 2022-06-20 DIAGNOSIS — N186 End stage renal disease: Secondary | ICD-10-CM

## 2022-06-20 NOTE — Progress Notes (Signed)
Location:  [facility] Room Number: 112 Place of Service:  SNF (31)   CODE STATUS: full   No Known Allergies  Chief Complaint  Patient presents with   Acute Visit    Care plan meeting     HPI:  We have come together for his care plan meeting. BIMS 07/15 mood 0/30. He is nonambulatory with no falls. He is independent to supervision with his adls. He is continent of bladder bowel. Dietary: CONCHO NAS with fluid restriction feeds self weight is 211.4 pounds has a good appetite; does not follow diet or fluid restrictions. Therapy none at this time. Activities: will attend some group activities especially with food. He will continue to be followed for his chronic illnesses including:  End stage renal disease on dialysis due to type 2 diabetes mellitus:  Dependence on hemodialysis  Aortic atherosclerosis  Past Medical History:  Diagnosis Date   Abnormal CT scan, kidney 10/06/2011   Acute pyelonephritis 10/07/2011   Anemia    normocytic   Anxiety    mental retardation   Bladder wall thickening 10/06/2011   BPH (benign prostatic hypertrophy)    Diabetes mellitus    Dialysis patient ([place])    Tuesday, Thursday and Saturday,    DVT of leg (deep venous thrombosis) ([place]) 12/25/2016   Edema     history of lower extremity edema   GERD (gastroesophageal reflux disease)    Heme positive stool    Hydronephrosis    Hyperkalemia    Hyperlipidemia    Hypernatremia    Hypertension    Hypothyroidism    Impaired speech    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[facility])    MR (mental retardation)    Muscle weakness    Obstructive uropathy    Perinephric abscess 10/07/2011   Poor historian poor historian   Primary colorectal adenocarcinoma ([place]) 04/2017   S/p total colectomy   Protein calorie malnutrition ([place])    Pyelonephritis    Renal failure (ARF), acute on chronic (HCC)    Renal insufficiency    chronic history   Sepsis ([place])    Smoking    Uremia    Urinary  retention    UTI (lower urinary tract infection) 10/06/2011    Past Surgical History:  Procedure Laterality Date   A/V FISTULAGRAM N/A 08/13/2018   Procedure: A/V FISTULAGRAM - Right Upper;  Surgeon: Elam Dutch, MD;  Location: [facility];  Service: Cardiovascular;  Laterality: N/A;   A/V FISTULAGRAM N/A 11/22/2018   Procedure: A/V FISTULAGRAM - Right Upper;  Surgeon: Waynetta Sandy, MD;  Location: [facility];  Service: Cardiovascular;  Laterality: N/A;   AV FISTULA PLACEMENT Left 07/06/2015   Procedure:  INSERTION LEFT ARM ARTERIOVENOUS GORTEX GRAFT;  Surgeon: Angelia Mould, MD;  Location: [facility];  Service: Vascular;  Laterality: Left;   AV FISTULA PLACEMENT Right 02/26/2016   Procedure: ARTERIOVENOUS (AV) FISTULA CREATION ;  Surgeon: Angelia Mould, MD;  Location: [facility];  Service: Vascular;  Laterality: Right;   AV FISTULA PLACEMENT Right 11/25/2018   Procedure: INSERTION OF ARTERIOVENOUS (AV) ARTEGRAFT RIGHT UPPER ARM;  Surgeon: Waynetta Sandy, MD;  Location: [facility];  Service: Vascular;  Laterality: Right;   AV FISTULA PLACEMENT Left 05/28/2021   Procedure: LEFT ARM ARTERIOVENOUS (AV) FISTULA;  Surgeon: Rosetta Posner, MD;  Location[facility];  Service: Vascular;  Laterality: Left;   [place] REMOVAL Left 10/09/2015   Procedure: REMOVAL OF ARTERIOVENOUS GORETEX  GRAFT ([place]) Evacuation of Lymphocele, Vein Patch angioplasty of brachial artery.;  Surgeon: Angelia Mould, MD;  Location: [facility];  Service: Vascular;  Laterality: Left;   [place] Right 02/26/2016   Procedure: Right [place];  Surgeon: Angelia Mould, MD;  Location: [facility];  Service: Vascular;  Laterality: Right;   [place] Left 07/30/2021   Procedure: LEFT ARM SECOND STAGE BASILIC VEIN TRANSPOSITION;  Surgeon: Rosetta Posner, MD;  Location: [facility];  Service: Vascular;  Laterality: Left;   CIRCUMCISION N/A  01/04/2014   Procedure: CIRCUMCISION ADULT (procedure #1);  Surgeon: Marissa Nestle, MD;  Location[facility];  Service: Urology;  Laterality: N/A;   COLECTOMY N/A 05/04/2017   Procedure: TOTAL COLECTOMY;  Surgeon: Aviva Signs, MD;  Location[facility];  Service: General;  Laterality: N/A;   COLONOSCOPY N/A 04/27/2017   Surgeon: Daneil Dolin, MD; annular mass in the ascending colon likely representing cancer biopsied, multiple 6-22 mm polyps removed, clean rectum.  Pathology with multiple tubular adenomas, high-grade dysplasia noted in ascending colon and splenic flexure.   CYSTOSCOPY W/ RETROGRADES Bilateral 06/29/2015   Procedure: CYSTOSCOPY, DILATION OF URETHRAL STRICTURE WITH BILATERAL RETROGRADE PYELOGRAM,SUPRAPUBIC TUBE CHANGE;  Surgeon: Festus Aloe, MD;  Location: [facility];  Service: Urology;  Laterality: Bilateral;   CYSTOSCOPY WITH URETHRAL DILATATION N/A 12/29/2013   Procedure: CYSTOSCOPY WITH URETHRAL DILATATION;  Surgeon: Marissa Nestle, MD;  [facility];  Service: Urology;  Laterality: N/A;   ESOPHAGOGASTRODUODENOSCOPY N/A 04/27/2017   Procedure: ESOPHAGOGASTRODUODENOSCOPY (EGD);  Surgeon: Daneil Dolin, MD;  [facility];  Service: Endoscopy;  Laterality: N/A;   FLEXIBLE SIGMOIDOSCOPY N/A 12/02/2021   Procedure: FLEXIBLE SIGMOIDOSCOPY;  Surgeon: Eloise Harman, DO;  [facility];  Service: Endoscopy;  Laterality: N/A;  10:30am, dialysis patient   INSERTION OF DIALYSIS CATHETER Right 11/25/2018   Procedure: INSERTION OF DIALYSIS CATHETER RIGHT INTERNAL JUGULAR;  Surgeon: Waynetta Sandy, MD;  Location: [facility];  Service: Vascular;  Laterality: Right;   IR AV DIALY SHUNT INTRO NEEDLE/INTRACATH INITIAL W/PTA/IMG RIGHT Right 09/07/2018   IR FLUORO GUIDE CV LINE RIGHT  10/16/2020   IR REMOVAL TUN CV CATH W/O FL  01/12/2019   IR THROMBECTOMY AV FISTULA W/THROMBOLYSIS/PTA INC/SHUNT/IMG RIGHT Right 04/26/2018   IR US GUIDE VASC ACCESS RIGHT   04/26/2018   IR US GUIDE VASC ACCESS RIGHT  09/07/2018   IR US GUIDE VASC ACCESS RIGHT  10/16/2020   IR US GUIDE VASC ACCESS RIGHT  10/16/2020   ORIF FEMUR FRACTURE Right 11/22/2016   Procedure: OPEN REDUCTION INTERNAL FIXATION (ORIF) DISTAL FEMUR FRACTURE;  Surgeon: Rod Can, MD;  Location: [facility];  Service: Orthopedics;  Laterality: Right;   PATCH ANGIOPLASTY Right 12/10/2017   Procedure: PATCH ANGIOPLASTY;  Surgeon: Angelia Mould, MD;  Location: [facility];  Service: Vascular;  Laterality: Right;   PERIPHERAL VASCULAR BALLOON ANGIOPLASTY  08/13/2018   Procedure: PERIPHERAL VASCULAR BALLOON ANGIOPLASTY;  Surgeon: Elam Dutch, MD;  Location: [facility];  Service: Cardiovascular;;  right AV fistula    PERIPHERAL VASCULAR BALLOON ANGIOPLASTY  11/22/2018   Procedure: PERIPHERAL VASCULAR BALLOON ANGIOPLASTY;  Surgeon: Waynetta Sandy, MD;  Location: [facility];  Service: Cardiovascular;;  rt AV fistula   PERIPHERAL VASCULAR CATHETERIZATION N/A 10/08/2015   Procedure: A/V Shuntogram;  Surgeon: Angelia Mould, MD;  Location: [facility];  Service: Cardiovascular;  Laterality: N/A;   REMOVAL OF A DIALYSIS CATHETER  N/A 12/03/2021   Procedure: MINOR REMOVAL OF A TUNNELED DIALYSIS CATHETER;  Surgeon: Rosetta Posner, MD;  [facility];  Service: Vascular;  Laterality: N/A;   THROMBECTOMY W/ EMBOLECTOMY Right 12/10/2017   Procedure: THROMBECTOMY REVISION RIGHT ARM  ARTERIOVENOUS FISTULA;  Surgeon: Angelia Mould, MD;  Location: [facility];  Service: Vascular;  Laterality: Right;   TRANSURETHRAL RESECTION OF PROSTATE N/A 01/04/2014   Procedure: TRANSURETHRAL RESECTION OF THE PROSTATE (TURP) (procedure #2);  Surgeon: Marissa Nestle, MD;  [facility];  Service: Urology;  Laterality: N/A;    Social History   Socioeconomic History   Marital status: Single    Spouse name: Not on file   Number of children: Not on file   Years of education:  Not on file   Highest education level: Not on file  Occupational History   Occupation: retired   Tobacco Use   Smoking status: Never   Smokeless tobacco: Never  Vaping Use   Vaping Use: Never used  Substance and Sexual Activity   Alcohol use: No   Drug use: No   Sexual activity: Not Currently  Other Topics Concern   Not on file  Social History Narrative   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   Long term resident of [facility]    Social Determinants of Health   Financial Resource Strain: Low Risk  (02/18/2018)   Overall Financial Resource Strain (CARDIA)    Difficulty of Paying Living Expenses: Not hard at all  Food Insecurity: No Food Insecurity (02/18/2018)   Hunger Vital Sign    Worried About Running Out of Food in the Last Year: Never true    Ran Out of Food in the Last Year: Never true  Transportation Needs: No Transportation Needs (02/18/2018)   PRAPARE - Hydrologist (Medical): No    Lack of Transportation (Non-Medical): No  Physical Activity: Unknown (09/15/2019)   Exercise Vital Sign    Days of Exercise per Week: Not on file    Minutes of Exercise per Session: 0 min  Stress: No Stress Concern Present (02/18/2018)   [place]    Feeling of Stress : Not at all  Social Connections: Unknown (09/15/2019)   Social Connection and Isolation Panel [NHANES]    Frequency of Communication with Friends and Family: Not on file    Frequency of Social Gatherings with Friends and Family: Never    Attends Religious Services: Not on file    Active Member of Clubs or Organizations: Not on file    Attends Archivist Meetings: Not on file    Marital Status: Not on file  Intimate Partner Violence: Not At Risk (02/18/2018)   Humiliation, Afraid, Rape, and Kick questionnaire    Fear of Current or Ex-Partner: No    Emotionally Abused: No    Physically  Abused: No    Sexually Abused: No   Family History  Problem Relation Age of Onset   Cancer Mother    Colon cancer Neg Hx       VITAL SIGNS BP 126/72   Pulse 75   Temp 97.6 F (36.4 C)   Resp 18   Ht '5\' 8"'$  (1.727 m)   Wt 211 lb 6.4 oz (95.9 kg)   SpO2 97%   BMI 32.14 kg/m   Outpatient Encounter Medications as of 06/20/2022  Medication Sig   aspirin EC 81 MG tablet Take 81 mg  by mouth daily. 0900   atorvastatin (LIPITOR) 20 MG tablet Take 20 mg by mouth at bedtime. 2000   Insulin Glargine (BASAGLAR KWIKPEN) 100 UNIT/ML SOPN Inject 20 Units into the skin at bedtime. 2100 DO NOT HOLD this insulin without provider notification   Insulin Pen Needle (BD AUTOSHIELD DUO) 30G X 5 MM MISC by Does not apply route. 3/16"   levothyroxine (SYNTHROID) 125 MCG tablet Take 125 mcg by mouth at bedtime. 2100   midodrine (PROAMATINE) 10 MG tablet Take 10 mg by mouth See admin instructions. Send med with resident on  Tues/Thurs/Sat to dialysis. do not give at [facility], give to resident to take with him. send with '5mg'$  to equal '15mg'$ . 6:00   midodrine (PROAMATINE) 5 MG tablet Take 5 mg by mouth See admin instructions. Send with resident on dialysis days.  Tues/Thurs/Sat  Take along with 10 mg to equal 15 mg 6:00   NON FORMULARY Diet: NAS, Cons CHO   omega-3 acid ethyl esters (LOVAZA) 1 g capsule Take 2 g by mouth at bedtime. 2100   omeprazole (PRILOSEC) 40 MG capsule Take 40 mg by mouth daily. 0900   polyethylene glycol (MIRALAX / GLYCOLAX) packet Take 17 g by mouth daily as needed for mild constipation.    sevelamer carbonate (RENVELA) 800 MG tablet Take 2,400 mg by mouth in the morning and at bedtime.   sevelamer carbonate (RENVELA) 800 MG tablet Take 2,400 mg by mouth daily.   tamsulosin (FLOMAX) 0.4 MG CAPS capsule Take 0.4 mg by mouth daily at 6 PM. Give 30 minutes after a meal. Do not crush or chew   torsemide (DEMADEX) 20 MG tablet Take 20 mg by mouth as directed. Sunday, Monday, Wednesday,  and Friday   vitamin B-12 (CYANOCOBALAMIN) 1000 MCG tablet Take 1,000 mcg by mouth daily.   No facility-administered encounter medications on file as of 06/20/2022.     SIGNIFICANT DIAGNOSTIC EXAMS   TODAY  06-11-22: dexa: t score: -2.052  NO NEW EXAMS.    LABS REVIEWED PREVIOUS;    06-12-21: tsh 5.269  06-27-21: hgb a1c 5.6 07-30-21: hgb 11.6; hct 34.0; glucose 104; bun 12; creat 7.30; k+ 5.2; na++ 135; ionized ca++ 0.8  08-12-21: tsh 0.822 11-07-21: wbc 4,5 hgb 9.5; hct 29.5; mcv 103.9 plt 107; glucose 129; bun 28; creat 9.01; k+ 4.5; na++ 138; ca 8.6; GFR 6; liver normal albumin 3.1; hgb a1c 5.8; chol 60 ldl 18; trig 71; hdl 28; tsh 1.538  11-22-21: hgb 9,3; hct 28.4; iron 80; tibc 203; ferritin 474; vitamin B 12: 272 folate 6.6  12-02-21: hgb 10.5; hct 31.0; glucose 85; bun 25; creat 9.80; k+ 3.9; na++ 140;   01-28-22; wbc 4.8; hgb 8.7; hct 26.0; mcv 105.3; plt 113; glucose 1; bun 40; creat 11.35; k+ 3.8; na++ 141; ca 8.8; GFR 4 d-dimer 0.43; CRP <0.5   NO NEW LABS.   Review of Systems  Constitutional:  Negative for malaise/fatigue.  Respiratory:  Negative for cough and shortness of breath.   Cardiovascular:  Negative for chest pain, palpitations and leg swelling.  Gastrointestinal:  Negative for abdominal pain, constipation and heartburn.  Musculoskeletal:  Negative for back pain, joint pain and myalgias.  Skin: Negative.   Neurological:  Negative for dizziness.  Psychiatric/Behavioral:  The patient is not nervous/anxious.    Physical Exam Constitutional:      General: He is not in acute distress.    Appearance: He is well-developed. He is not diaphoretic.  Neck:  Thyroid: No thyromegaly.  Cardiovascular:     Rate and Rhythm: Normal rate and regular rhythm.     Pulses: Normal pulses.     Heart sounds: Normal heart sounds.  Pulmonary:     Effort: Pulmonary effort is normal. No respiratory distress.     Breath sounds: Normal breath sounds.  Abdominal:     General:  Bowel sounds are normal. There is no distension.     Palpations: Abdomen is soft.     Tenderness: There is no abdominal tenderness.  Musculoskeletal:        General: Normal range of motion.     Cervical back: Neck supple.     Right lower leg: No edema.     Left lower leg: No edema.     Comments: Left upper fistula: + buitt and + thrill   Lymphadenopathy:     Cervical: No cervical adenopathy.  Skin:    General: Skin is warm and dry.  Neurological:     Mental Status: He is alert. Mental status is at baseline.  Psychiatric:        Mood and Affect: Mood normal.      ASSESSMENT/ PLAN:  TODAY  End stage renal disease on dialysis due to type 2 diabetes mellitus:  Dependence on hemodialysis Aortic atherosclerosis   Will continue current medication Will continue current plan of care Will continue to monitor    Time spent with patient: 40 minutes; medications; plan of care; dietary   Ok Edwards NP [facility]  call [phone]

## 2022-06-21 DIAGNOSIS — D509 Iron deficiency anemia, unspecified: Secondary | ICD-10-CM | POA: Diagnosis not present

## 2022-06-21 DIAGNOSIS — N2581 Secondary hyperparathyroidism of renal origin: Secondary | ICD-10-CM | POA: Diagnosis not present

## 2022-06-21 DIAGNOSIS — N186 End stage renal disease: Secondary | ICD-10-CM | POA: Diagnosis not present

## 2022-06-21 DIAGNOSIS — Z992 Dependence on renal dialysis: Secondary | ICD-10-CM | POA: Diagnosis not present

## 2022-06-21 DIAGNOSIS — N250 Renal osteodystrophy: Secondary | ICD-10-CM | POA: Diagnosis not present

## 2022-06-24 DIAGNOSIS — N2581 Secondary hyperparathyroidism of renal origin: Secondary | ICD-10-CM | POA: Diagnosis not present

## 2022-06-24 DIAGNOSIS — N250 Renal osteodystrophy: Secondary | ICD-10-CM | POA: Diagnosis not present

## 2022-06-24 DIAGNOSIS — N186 End stage renal disease: Secondary | ICD-10-CM | POA: Diagnosis not present

## 2022-06-24 DIAGNOSIS — D509 Iron deficiency anemia, unspecified: Secondary | ICD-10-CM | POA: Diagnosis not present

## 2022-06-24 DIAGNOSIS — Z992 Dependence on renal dialysis: Secondary | ICD-10-CM | POA: Diagnosis not present

## 2022-06-26 DIAGNOSIS — D509 Iron deficiency anemia, unspecified: Secondary | ICD-10-CM | POA: Diagnosis not present

## 2022-06-26 DIAGNOSIS — N250 Renal osteodystrophy: Secondary | ICD-10-CM | POA: Diagnosis not present

## 2022-06-26 DIAGNOSIS — N186 End stage renal disease: Secondary | ICD-10-CM | POA: Diagnosis not present

## 2022-06-26 DIAGNOSIS — Z992 Dependence on renal dialysis: Secondary | ICD-10-CM | POA: Diagnosis not present

## 2022-06-26 DIAGNOSIS — N2581 Secondary hyperparathyroidism of renal origin: Secondary | ICD-10-CM | POA: Diagnosis not present

## 2022-06-28 DIAGNOSIS — N186 End stage renal disease: Secondary | ICD-10-CM | POA: Diagnosis not present

## 2022-06-28 DIAGNOSIS — N250 Renal osteodystrophy: Secondary | ICD-10-CM | POA: Diagnosis not present

## 2022-06-28 DIAGNOSIS — D509 Iron deficiency anemia, unspecified: Secondary | ICD-10-CM | POA: Diagnosis not present

## 2022-06-28 DIAGNOSIS — D631 Anemia in chronic kidney disease: Secondary | ICD-10-CM | POA: Diagnosis not present

## 2022-06-28 DIAGNOSIS — N2581 Secondary hyperparathyroidism of renal origin: Secondary | ICD-10-CM | POA: Diagnosis not present

## 2022-06-28 DIAGNOSIS — Z992 Dependence on renal dialysis: Secondary | ICD-10-CM | POA: Diagnosis not present

## 2022-07-01 DIAGNOSIS — Z992 Dependence on renal dialysis: Secondary | ICD-10-CM | POA: Diagnosis not present

## 2022-07-01 DIAGNOSIS — D509 Iron deficiency anemia, unspecified: Secondary | ICD-10-CM | POA: Diagnosis not present

## 2022-07-01 DIAGNOSIS — D631 Anemia in chronic kidney disease: Secondary | ICD-10-CM | POA: Diagnosis not present

## 2022-07-01 DIAGNOSIS — N2581 Secondary hyperparathyroidism of renal origin: Secondary | ICD-10-CM | POA: Diagnosis not present

## 2022-07-01 DIAGNOSIS — N250 Renal osteodystrophy: Secondary | ICD-10-CM | POA: Diagnosis not present

## 2022-07-01 DIAGNOSIS — N186 End stage renal disease: Secondary | ICD-10-CM | POA: Diagnosis not present

## 2022-07-03 DIAGNOSIS — Z992 Dependence on renal dialysis: Secondary | ICD-10-CM | POA: Diagnosis not present

## 2022-07-03 DIAGNOSIS — D509 Iron deficiency anemia, unspecified: Secondary | ICD-10-CM | POA: Diagnosis not present

## 2022-07-03 DIAGNOSIS — N250 Renal osteodystrophy: Secondary | ICD-10-CM | POA: Diagnosis not present

## 2022-07-03 DIAGNOSIS — D631 Anemia in chronic kidney disease: Secondary | ICD-10-CM | POA: Diagnosis not present

## 2022-07-03 DIAGNOSIS — N186 End stage renal disease: Secondary | ICD-10-CM | POA: Diagnosis not present

## 2022-07-03 DIAGNOSIS — N2581 Secondary hyperparathyroidism of renal origin: Secondary | ICD-10-CM | POA: Diagnosis not present

## 2022-07-05 DIAGNOSIS — D631 Anemia in chronic kidney disease: Secondary | ICD-10-CM | POA: Diagnosis not present

## 2022-07-05 DIAGNOSIS — N186 End stage renal disease: Secondary | ICD-10-CM | POA: Diagnosis not present

## 2022-07-05 DIAGNOSIS — N2581 Secondary hyperparathyroidism of renal origin: Secondary | ICD-10-CM | POA: Diagnosis not present

## 2022-07-05 DIAGNOSIS — D509 Iron deficiency anemia, unspecified: Secondary | ICD-10-CM | POA: Diagnosis not present

## 2022-07-05 DIAGNOSIS — Z992 Dependence on renal dialysis: Secondary | ICD-10-CM | POA: Diagnosis not present

## 2022-07-05 DIAGNOSIS — N250 Renal osteodystrophy: Secondary | ICD-10-CM | POA: Diagnosis not present

## 2022-07-08 DIAGNOSIS — D631 Anemia in chronic kidney disease: Secondary | ICD-10-CM | POA: Diagnosis not present

## 2022-07-08 DIAGNOSIS — D509 Iron deficiency anemia, unspecified: Secondary | ICD-10-CM | POA: Diagnosis not present

## 2022-07-08 DIAGNOSIS — N186 End stage renal disease: Secondary | ICD-10-CM | POA: Diagnosis not present

## 2022-07-08 DIAGNOSIS — N250 Renal osteodystrophy: Secondary | ICD-10-CM | POA: Diagnosis not present

## 2022-07-08 DIAGNOSIS — Z992 Dependence on renal dialysis: Secondary | ICD-10-CM | POA: Diagnosis not present

## 2022-07-08 DIAGNOSIS — N2581 Secondary hyperparathyroidism of renal origin: Secondary | ICD-10-CM | POA: Diagnosis not present

## 2022-07-10 DIAGNOSIS — N250 Renal osteodystrophy: Secondary | ICD-10-CM | POA: Diagnosis not present

## 2022-07-10 DIAGNOSIS — N186 End stage renal disease: Secondary | ICD-10-CM | POA: Diagnosis not present

## 2022-07-10 DIAGNOSIS — D631 Anemia in chronic kidney disease: Secondary | ICD-10-CM | POA: Diagnosis not present

## 2022-07-10 DIAGNOSIS — D509 Iron deficiency anemia, unspecified: Secondary | ICD-10-CM | POA: Diagnosis not present

## 2022-07-10 DIAGNOSIS — N2581 Secondary hyperparathyroidism of renal origin: Secondary | ICD-10-CM | POA: Diagnosis not present

## 2022-07-10 DIAGNOSIS — Z992 Dependence on renal dialysis: Secondary | ICD-10-CM | POA: Diagnosis not present

## 2022-07-12 DIAGNOSIS — N250 Renal osteodystrophy: Secondary | ICD-10-CM | POA: Diagnosis not present

## 2022-07-12 DIAGNOSIS — Z992 Dependence on renal dialysis: Secondary | ICD-10-CM | POA: Diagnosis not present

## 2022-07-12 DIAGNOSIS — N186 End stage renal disease: Secondary | ICD-10-CM | POA: Diagnosis not present

## 2022-07-12 DIAGNOSIS — D509 Iron deficiency anemia, unspecified: Secondary | ICD-10-CM | POA: Diagnosis not present

## 2022-07-12 DIAGNOSIS — D631 Anemia in chronic kidney disease: Secondary | ICD-10-CM | POA: Diagnosis not present

## 2022-07-12 DIAGNOSIS — N2581 Secondary hyperparathyroidism of renal origin: Secondary | ICD-10-CM | POA: Diagnosis not present

## 2022-07-15 DIAGNOSIS — Z992 Dependence on renal dialysis: Secondary | ICD-10-CM | POA: Diagnosis not present

## 2022-07-15 DIAGNOSIS — D631 Anemia in chronic kidney disease: Secondary | ICD-10-CM | POA: Diagnosis not present

## 2022-07-15 DIAGNOSIS — N2581 Secondary hyperparathyroidism of renal origin: Secondary | ICD-10-CM | POA: Diagnosis not present

## 2022-07-15 DIAGNOSIS — N186 End stage renal disease: Secondary | ICD-10-CM | POA: Diagnosis not present

## 2022-07-15 DIAGNOSIS — N250 Renal osteodystrophy: Secondary | ICD-10-CM | POA: Diagnosis not present

## 2022-07-15 DIAGNOSIS — D509 Iron deficiency anemia, unspecified: Secondary | ICD-10-CM | POA: Diagnosis not present

## 2022-07-17 DIAGNOSIS — N250 Renal osteodystrophy: Secondary | ICD-10-CM | POA: Diagnosis not present

## 2022-07-17 DIAGNOSIS — Z992 Dependence on renal dialysis: Secondary | ICD-10-CM | POA: Diagnosis not present

## 2022-07-17 DIAGNOSIS — D509 Iron deficiency anemia, unspecified: Secondary | ICD-10-CM | POA: Diagnosis not present

## 2022-07-17 DIAGNOSIS — N186 End stage renal disease: Secondary | ICD-10-CM | POA: Diagnosis not present

## 2022-07-17 DIAGNOSIS — N2581 Secondary hyperparathyroidism of renal origin: Secondary | ICD-10-CM | POA: Diagnosis not present

## 2022-07-17 DIAGNOSIS — D631 Anemia in chronic kidney disease: Secondary | ICD-10-CM | POA: Diagnosis not present

## 2022-07-19 DIAGNOSIS — Z992 Dependence on renal dialysis: Secondary | ICD-10-CM | POA: Diagnosis not present

## 2022-07-19 DIAGNOSIS — N250 Renal osteodystrophy: Secondary | ICD-10-CM | POA: Diagnosis not present

## 2022-07-19 DIAGNOSIS — N186 End stage renal disease: Secondary | ICD-10-CM | POA: Diagnosis not present

## 2022-07-19 DIAGNOSIS — D631 Anemia in chronic kidney disease: Secondary | ICD-10-CM | POA: Diagnosis not present

## 2022-07-19 DIAGNOSIS — D509 Iron deficiency anemia, unspecified: Secondary | ICD-10-CM | POA: Diagnosis not present

## 2022-07-19 DIAGNOSIS — N2581 Secondary hyperparathyroidism of renal origin: Secondary | ICD-10-CM | POA: Diagnosis not present

## 2022-07-22 DIAGNOSIS — D509 Iron deficiency anemia, unspecified: Secondary | ICD-10-CM | POA: Diagnosis not present

## 2022-07-22 DIAGNOSIS — D631 Anemia in chronic kidney disease: Secondary | ICD-10-CM | POA: Diagnosis not present

## 2022-07-22 DIAGNOSIS — Z992 Dependence on renal dialysis: Secondary | ICD-10-CM | POA: Diagnosis not present

## 2022-07-22 DIAGNOSIS — N186 End stage renal disease: Secondary | ICD-10-CM | POA: Diagnosis not present

## 2022-07-22 DIAGNOSIS — N2581 Secondary hyperparathyroidism of renal origin: Secondary | ICD-10-CM | POA: Diagnosis not present

## 2022-07-22 DIAGNOSIS — N250 Renal osteodystrophy: Secondary | ICD-10-CM | POA: Diagnosis not present

## 2022-07-24 DIAGNOSIS — D509 Iron deficiency anemia, unspecified: Secondary | ICD-10-CM | POA: Diagnosis not present

## 2022-07-24 DIAGNOSIS — J111 Influenza due to unidentified influenza virus with other respiratory manifestations: Secondary | ICD-10-CM | POA: Diagnosis not present

## 2022-07-24 DIAGNOSIS — E1129 Type 2 diabetes mellitus with other diabetic kidney complication: Secondary | ICD-10-CM | POA: Diagnosis not present

## 2022-07-24 DIAGNOSIS — Z992 Dependence on renal dialysis: Secondary | ICD-10-CM | POA: Diagnosis not present

## 2022-07-24 DIAGNOSIS — Z1159 Encounter for screening for other viral diseases: Secondary | ICD-10-CM | POA: Diagnosis not present

## 2022-07-24 DIAGNOSIS — Z1383 Encounter for screening for respiratory disorder NEC: Secondary | ICD-10-CM | POA: Diagnosis not present

## 2022-07-24 DIAGNOSIS — D631 Anemia in chronic kidney disease: Secondary | ICD-10-CM | POA: Diagnosis not present

## 2022-07-24 DIAGNOSIS — R059 Cough, unspecified: Secondary | ICD-10-CM | POA: Diagnosis not present

## 2022-07-24 DIAGNOSIS — I120 Hypertensive chronic kidney disease with stage 5 chronic kidney disease or end stage renal disease: Secondary | ICD-10-CM | POA: Diagnosis not present

## 2022-07-24 DIAGNOSIS — Z03818 Encounter for observation for suspected exposure to other biological agents ruled out: Secondary | ICD-10-CM | POA: Diagnosis not present

## 2022-07-24 DIAGNOSIS — N186 End stage renal disease: Secondary | ICD-10-CM | POA: Diagnosis not present

## 2022-07-24 DIAGNOSIS — N250 Renal osteodystrophy: Secondary | ICD-10-CM | POA: Diagnosis not present

## 2022-07-24 DIAGNOSIS — N2581 Secondary hyperparathyroidism of renal origin: Secondary | ICD-10-CM | POA: Diagnosis not present

## 2022-07-26 DIAGNOSIS — D631 Anemia in chronic kidney disease: Secondary | ICD-10-CM | POA: Diagnosis not present

## 2022-07-26 DIAGNOSIS — N2581 Secondary hyperparathyroidism of renal origin: Secondary | ICD-10-CM | POA: Diagnosis not present

## 2022-07-26 DIAGNOSIS — D509 Iron deficiency anemia, unspecified: Secondary | ICD-10-CM | POA: Diagnosis not present

## 2022-07-26 DIAGNOSIS — Z992 Dependence on renal dialysis: Secondary | ICD-10-CM | POA: Diagnosis not present

## 2022-07-26 DIAGNOSIS — N186 End stage renal disease: Secondary | ICD-10-CM | POA: Diagnosis not present

## 2022-07-26 DIAGNOSIS — N250 Renal osteodystrophy: Secondary | ICD-10-CM | POA: Diagnosis not present

## 2022-07-29 ENCOUNTER — Encounter: Payer: Self-pay | Admitting: Adult Health

## 2022-07-29 ENCOUNTER — Non-Acute Institutional Stay (SKILLED_NURSING_FACILITY): Payer: Medicare Other | Admitting: Adult Health

## 2022-07-29 DIAGNOSIS — R338 Other retention of urine: Secondary | ICD-10-CM

## 2022-07-29 DIAGNOSIS — I700 Atherosclerosis of aorta: Secondary | ICD-10-CM

## 2022-07-29 DIAGNOSIS — N401 Enlarged prostate with lower urinary tract symptoms: Secondary | ICD-10-CM | POA: Diagnosis not present

## 2022-07-29 DIAGNOSIS — D696 Thrombocytopenia, unspecified: Secondary | ICD-10-CM

## 2022-07-29 DIAGNOSIS — E538 Deficiency of other specified B group vitamins: Secondary | ICD-10-CM

## 2022-07-29 DIAGNOSIS — F321 Major depressive disorder, single episode, moderate: Secondary | ICD-10-CM | POA: Diagnosis not present

## 2022-07-29 DIAGNOSIS — E1122 Type 2 diabetes mellitus with diabetic chronic kidney disease: Secondary | ICD-10-CM

## 2022-07-29 DIAGNOSIS — N186 End stage renal disease: Secondary | ICD-10-CM

## 2022-07-29 DIAGNOSIS — E034 Atrophy of thyroid (acquired): Secondary | ICD-10-CM | POA: Diagnosis not present

## 2022-07-29 DIAGNOSIS — Z992 Dependence on renal dialysis: Secondary | ICD-10-CM | POA: Diagnosis not present

## 2022-07-29 DIAGNOSIS — I120 Hypertensive chronic kidney disease with stage 5 chronic kidney disease or end stage renal disease: Secondary | ICD-10-CM

## 2022-07-29 DIAGNOSIS — N2581 Secondary hyperparathyroidism of renal origin: Secondary | ICD-10-CM

## 2022-07-29 DIAGNOSIS — K219 Gastro-esophageal reflux disease without esophagitis: Secondary | ICD-10-CM

## 2022-07-29 DIAGNOSIS — D631 Anemia in chronic kidney disease: Secondary | ICD-10-CM

## 2022-07-29 DIAGNOSIS — E785 Hyperlipidemia, unspecified: Secondary | ICD-10-CM

## 2022-07-29 DIAGNOSIS — D509 Iron deficiency anemia, unspecified: Secondary | ICD-10-CM | POA: Diagnosis not present

## 2022-07-29 DIAGNOSIS — N250 Renal osteodystrophy: Secondary | ICD-10-CM | POA: Diagnosis not present

## 2022-07-29 DIAGNOSIS — Z23 Encounter for immunization: Secondary | ICD-10-CM | POA: Diagnosis not present

## 2022-07-29 DIAGNOSIS — E1169 Type 2 diabetes mellitus with other specified complication: Secondary | ICD-10-CM | POA: Diagnosis not present

## 2022-07-29 DIAGNOSIS — Z85038 Personal history of other malignant neoplasm of large intestine: Secondary | ICD-10-CM

## 2022-07-29 DIAGNOSIS — R600 Localized edema: Secondary | ICD-10-CM | POA: Diagnosis not present

## 2022-07-29 NOTE — Progress Notes (Unsigned)
Location:  [facility] Room Number: 112-D Place of Service:  SNF (31)   CODE STATUS: Full Code  No Known Allergies  Chief Complaint  Patient presents with   Annual Exam    HPI:  He is a 76 year old long term resident of this facility being seen for his annual exam. He has not been hospitalized for acute illnesses over this past year; with no visits to the ED. He has had his left arm fistula placed in Oct 2022 with removal of tunneled dialysis access in Feb 2023. He continues to get out of bed daily. There are no reports of uncontrolled pain. His weight is stable. He continues to be followed for his chronic illnesses including: Type 2 diabetes mellitus with hypertension and end stage renal disease on hemodialysis:  Thrombocytopenia:  Aortic atherosclerosis. 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:   Past Medical History:  Diagnosis Date   Abnormal CT scan, kidney 10/06/2011   Acute pyelonephritis 10/07/2011   Anemia    normocytic   Anxiety    mental retardation   Bladder wall thickening 10/06/2011   BPH (benign prostatic hypertrophy)    Diabetes mellitus    Dialysis patient ([place])    Tuesday, Thursday and Saturday,    DVT of leg (deep venous thrombosis) ([place]) 12/25/2016   Edema     history of lower extremity edema   GERD (gastroesophageal reflux disease)    Heme positive stool    Hydronephrosis    Hyperkalemia    Hyperlipidemia    Hypernatremia    Hypertension    Hypothyroidism    Impaired speech    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[place])    MR (mental retardation)    Muscle weakness    Obstructive uropathy    Perinephric abscess 10/07/2011   Poor historian poor historian   Primary colorectal adenocarcinoma ([place]) 04/2017   S/p total colectomy   Protein calorie malnutrition ([place])    Pyelonephritis    Renal failure (ARF), acute on chronic (HCC)    Renal insufficiency    chronic history    Sepsis ([place])    Smoking    Uremia    Urinary retention    UTI (lower urinary tract infection) 10/06/2011    Past Surgical History:  Procedure Laterality Date   A/V FISTULAGRAM N/A 08/13/2018   Procedure: A/V FISTULAGRAM - Right Upper;  Surgeon: Elam Dutch, MD;  Location: [facility];  Service: Cardiovascular;  Laterality: N/A;   A/V FISTULAGRAM N/A 11/22/2018   Procedure: A/V FISTULAGRAM - Right Upper;  Surgeon: Waynetta Sandy, MD;  Location: [facility];  Service: Cardiovascular;  Laterality: N/A;   AV FISTULA PLACEMENT Left 07/06/2015   Procedure:  INSERTION LEFT ARM ARTERIOVENOUS GORTEX GRAFT;  Surgeon: Angelia Mould, MD;  Location: [facility];  Service: Vascular;  Laterality: Left;   AV FISTULA PLACEMENT Right 02/26/2016   Procedure: ARTERIOVENOUS (AV) FISTULA CREATION ;  Surgeon: Angelia Mould, MD;  Location: [facility];  Service: Vascular;  Laterality: Right;   AV FISTULA PLACEMENT Right 11/25/2018   Procedure: INSERTION OF ARTERIOVENOUS (AV) ARTEGRAFT RIGHT UPPER ARM;  Surgeon: Waynetta Sandy, MD;  Location: [facility];  Service: Vascular;  Laterality: Right;   AV FISTULA PLACEMENT Left 05/28/2021   Procedure: LEFT ARM ARTERIOVENOUS (AV) FISTULA;  Surgeon: Rosetta Posner, MD;  [facility];  Service: Vascular;  Laterality: Left;   [place] REMOVAL Left  10/09/2015   Procedure: REMOVAL OF ARTERIOVENOUS GORETEX GRAFT ([place]) Evacuation of Lymphocele, Vein Patch angioplasty of brachial artery.;  Surgeon: Angelia Mould, MD;  Location: [facility];  Service: Vascular;  Laterality: Left;   [place] Right 02/26/2016   Procedure: Right [place];  Surgeon: Angelia Mould, MD;  Location: [facility];  Service: Vascular;  Laterality: Right;   [place] Left 07/30/2021   Procedure: LEFT ARM SECOND STAGE BASILIC VEIN TRANSPOSITION;  Surgeon: Rosetta Posner, MD;  Location[facility];  Service: Vascular;   Laterality: Left;   CIRCUMCISION N/A 01/04/2014   Procedure: CIRCUMCISION ADULT (procedure #1);  Surgeon: Marissa Nestle, MD;  Location[facility];  Service: Urology;  Laterality: N/A;   COLECTOMY N/A 05/04/2017   Procedure: TOTAL COLECTOMY;  Surgeon: Aviva Signs, MD;  Location: [facility];  Service: General;  Laterality: N/A;   COLONOSCOPY N/A 04/27/2017   Surgeon: Daneil Dolin, MD; annular mass in the ascending colon likely representing cancer biopsied, multiple 6-22 mm polyps removed, clean rectum.  Pathology with multiple tubular adenomas, high-grade dysplasia noted in ascending colon and splenic flexure.   CYSTOSCOPY W/ RETROGRADES Bilateral 06/29/2015   Procedure: CYSTOSCOPY, DILATION OF URETHRAL STRICTURE WITH BILATERAL RETROGRADE PYELOGRAM,SUPRAPUBIC TUBE CHANGE;  Surgeon: Festus Aloe, MD;  Location: [facility];  Service: Urology;  Laterality: Bilateral;   CYSTOSCOPY WITH URETHRAL DILATATION N/A 12/29/2013   Procedure: CYSTOSCOPY WITH URETHRAL DILATATION;  Surgeon: Marissa Nestle, MD;  [facility];  Service: Urology;  Laterality: N/A;   ESOPHAGOGASTRODUODENOSCOPY N/A 04/27/2017   Procedure: ESOPHAGOGASTRODUODENOSCOPY (EGD);  Surgeon: Daneil Dolin, MD;  [facility];  Service: Endoscopy;  Laterality: N/A;   FLEXIBLE SIGMOIDOSCOPY N/A 12/02/2021   Procedure: FLEXIBLE SIGMOIDOSCOPY;  Surgeon: Eloise Harman, DO;  [facility];  Service: Endoscopy;  Laterality: N/A;  10:30am, dialysis patient   INSERTION OF DIALYSIS CATHETER Right 11/25/2018   Procedure: INSERTION OF DIALYSIS CATHETER RIGHT INTERNAL JUGULAR;  Surgeon: Waynetta Sandy, MD;  Location: [facility];  Service: Vascular;  Laterality: Right;   IR AV DIALY SHUNT INTRO NEEDLE/INTRACATH INITIAL W/PTA/IMG RIGHT Right 09/07/2018   IR FLUORO GUIDE CV LINE RIGHT  10/16/2020   IR REMOVAL TUN CV CATH W/O FL  01/12/2019   IR THROMBECTOMY AV FISTULA W/THROMBOLYSIS/PTA INC/SHUNT/IMG RIGHT Right  04/26/2018   IR US GUIDE VASC ACCESS RIGHT  04/26/2018   IR US GUIDE VASC ACCESS RIGHT  09/07/2018   IR US GUIDE VASC ACCESS RIGHT  10/16/2020   IR US GUIDE VASC ACCESS RIGHT  10/16/2020   ORIF FEMUR FRACTURE Right 11/22/2016   Procedure: OPEN REDUCTION INTERNAL FIXATION (ORIF) DISTAL FEMUR FRACTURE;  Surgeon: Rod Can, MD;  Location: [facility];  Service: Orthopedics;  Laterality: Right;   PATCH ANGIOPLASTY Right 12/10/2017   Procedure: PATCH ANGIOPLASTY;  Surgeon: Angelia Mould, MD;  Location: [facility];  Service: Vascular;  Laterality: Right;   PERIPHERAL VASCULAR BALLOON ANGIOPLASTY  08/13/2018   Procedure: PERIPHERAL VASCULAR BALLOON ANGIOPLASTY;  Surgeon: Elam Dutch, MD;  Location: [facility];  Service: Cardiovascular;;  right AV fistula    PERIPHERAL VASCULAR BALLOON ANGIOPLASTY  11/22/2018   Procedure: PERIPHERAL VASCULAR BALLOON ANGIOPLASTY;  Surgeon: Waynetta Sandy, MD;  Location: [facility];  Service: Cardiovascular;;  rt AV fistula   PERIPHERAL VASCULAR CATHETERIZATION N/A 10/08/2015   Procedure: A/V Shuntogram;  Surgeon: Angelia Mould, MD;  Location: [facility];  Service: Cardiovascular;  Laterality:  N/A;   REMOVAL OF A DIALYSIS CATHETER N/A 12/03/2021   Procedure: MINOR REMOVAL OF A TUNNELED DIALYSIS CATHETER;  Surgeon: Rosetta Posner, MD;  [facility];  Service: Vascular;  Laterality: N/A;   THROMBECTOMY W/ EMBOLECTOMY Right 12/10/2017   Procedure: THROMBECTOMY REVISION RIGHT ARM  ARTERIOVENOUS FISTULA;  Surgeon: Angelia Mould, MD;  [facility];  Service: Vascular;  Laterality: Right;   TRANSURETHRAL RESECTION OF PROSTATE N/A 01/04/2014   Procedure: TRANSURETHRAL RESECTION OF THE PROSTATE (TURP) (procedure #2);  Surgeon: Marissa Nestle, MD;  [facility];  Service: Urology;  Laterality: N/A;    Social History   Socioeconomic History   Marital status: Single    Spouse name: Not on file   Number of  children: Not on file   Years of education: Not on file   Highest education level: Not on file  Occupational History   Occupation: retired   Tobacco Use   Smoking status: Never   Smokeless tobacco: Never  Vaping Use   Vaping Use: Never used  Substance and Sexual Activity   Alcohol use: No   Drug use: No   Sexual activity: Not Currently  Other Topics Concern   Not on file  Social History Narrative   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   Long term resident of [facility]    Social Determinants of Health   Financial Resource Strain: Low Risk  (02/18/2018)   Overall Financial Resource Strain (CARDIA)    Difficulty of Paying Living Expenses: Not hard at all  Food Insecurity: No Food Insecurity (02/18/2018)   Hunger Vital Sign    Worried About Running Out of Food in the Last Year: Never true    Ran Out of Food in the Last Year: Never true  Transportation Needs: No Transportation Needs (02/18/2018)   PRAPARE - Hydrologist (Medical): No    Lack of Transportation (Non-Medical): No  Physical Activity: Unknown (09/15/2019)   Exercise Vital Sign    Days of Exercise per Week: Not on file    Minutes of Exercise per Session: 0 min  Stress: No Stress Concern Present (02/18/2018)   [place]    Feeling of Stress : Not at all  Social Connections: Unknown (09/15/2019)   Social Connection and Isolation Panel [NHANES]    Frequency of Communication with Friends and Family: Not on file    Frequency of Social Gatherings with Friends and Family: Never    Attends Religious Services: Not on file    Active Member of Clubs or Organizations: Not on file    Attends Archivist Meetings: Not on file    Marital Status: Not on file  Intimate Partner Violence: Not At Risk (02/18/2018)   Humiliation, Afraid, Rape, and Kick questionnaire    Fear of Current or Ex-Partner: No     Emotionally Abused: No    Physically Abused: No    Sexually Abused: No   Family History  Problem Relation Age of Onset   Cancer Mother    Colon cancer Neg Hx       VITAL SIGNS BP 112/70   Pulse 70   Temp 97.8 F (36.6 C)   Resp 20   Ht '5\' 8"'$  (1.727 m)   Wt 208 lb 12.8 oz (94.7 kg)   SpO2 94%   BMI 31.75 kg/m   Outpatient Encounter Medications as of 07/29/2022  Medication Sig  aspirin EC 81 MG tablet Take 81 mg by mouth daily. 0900   atorvastatin (LIPITOR) 20 MG tablet Take 20 mg by mouth at bedtime. 2000   Insulin Glargine (BASAGLAR KWIKPEN) 100 UNIT/ML SOPN Inject 20 Units into the skin at bedtime. 2100 DO NOT HOLD this insulin without provider notification   Insulin Pen Needle (BD AUTOSHIELD DUO) 30G X 5 MM MISC by Does not apply route. 3/16"   levothyroxine (SYNTHROID) 125 MCG tablet Take 125 mcg by mouth at bedtime. 2100   midodrine (PROAMATINE) 10 MG tablet Take 10 mg by mouth See admin instructions. Send med with resident on  Tues/Thurs/Sat to dialysis. do not give at [facility], give to resident to take with him. send with '5mg'$  to equal '15mg'$ . 6:00   midodrine (PROAMATINE) 5 MG tablet Take 5 mg by mouth See admin instructions. Send with resident on dialysis days.  Tues/Thurs/Sat  Take along with 10 mg to equal 15 mg 6:00   NON FORMULARY Diet: NAS, Cons CHO   omega-3 acid ethyl esters (LOVAZA) 1 g capsule Take 2 g by mouth at bedtime. 2100   omeprazole (PRILOSEC) 40 MG capsule Take 40 mg by mouth daily. 0900   polyethylene glycol (MIRALAX / GLYCOLAX) packet Take 17 g by mouth daily as needed for mild constipation.    sevelamer carbonate (RENVELA) 800 MG tablet Take 2,400 mg by mouth in the morning and at bedtime.   sevelamer carbonate (RENVELA) 800 MG tablet Take 2,400 mg by mouth daily.   tamsulosin (FLOMAX) 0.4 MG CAPS capsule Take 0.4 mg by mouth daily at 6 PM. Give 30 minutes after a meal. Do not crush or chew   torsemide (DEMADEX) 20 MG tablet Take 20 mg by mouth  as directed. Sunday, Monday, Wednesday, and Friday   vitamin B-12 (CYANOCOBALAMIN) 1000 MCG tablet Take 1,000 mcg by mouth daily.   No facility-administered encounter medications on file as of 07/29/2022.     SIGNIFICANT DIAGNOSTIC EXAMS   PREVIOUS   12-30-21: ct of abdomen and pelvis:  1. No significant change in size or appearance of the hypodense lesion in the head of the pancreas, possible cyst or IPMN. Recommend continued follow-up with contrast-enhanced MRI or CT in 2 years. 2. Cholelithiasis. 3. Left renal calculus. Bilateral renal mild atrophy with multiple renal cysts. 4. Other chronic findings as described.  06-11-22: dexa: t score: -2.052  NO NEW EXAMS.    LABS REVIEWED PREVIOUS;    07-30-21: hgb 11.6; hct 34.0; glucose 104; bun 12; creat 7.30; k+ 5.2; na++ 135; ionized ca++ 0.8  08-12-21: tsh 0.822 11-07-21: wbc 4,5 hgb 9.5; hct 29.5; mcv 103.9 plt 107; glucose 129; bun 28; creat 9.01; k+ 4.5; na++ 138; ca 8.6; GFR 6; liver normal albumin 3.1; hgb a1c 5.8; chol 60 ldl 18; trig 71; hdl 28; tsh 1.538  11-22-21: hgb 9,3; hct 28.4; iron 80; tibc 203; ferritin 474; vitamin B 12: 272 folate 6.6  12-02-21: hgb 10.5; hct 31.0; glucose 85; bun 25; creat 9.80; k+ 3.9; na++ 140;   01-28-22; wbc 4.8; hgb 8.7; hct 26.0; mcv 105.3; plt 113; glucose 1; bun 40; creat 11.35; k+ 3.8; na++ 141; ca 8.8; GFR 4 d-dimer 0.43; CRP <0.5   TODAY  06-19-22: hgb a1c 5.8; tsh 2.539 free t4: 0.98  Review of Systems  Constitutional:  Negative for malaise/fatigue.  HENT:  Negative for congestion and nosebleeds.   Respiratory:  Negative for cough and shortness of breath.   Cardiovascular:  Negative for chest  pain, palpitations and leg swelling.  Gastrointestinal:  Negative for abdominal pain, constipation and heartburn.  Musculoskeletal:  Negative for back pain, joint pain and myalgias.  Skin: Negative.   Neurological:  Negative for dizziness.  Psychiatric/Behavioral:  Negative for depression. The  patient is not nervous/anxious and does not have insomnia.    Physical Exam Constitutional:      General: He is not in acute distress.    Appearance: He is well-developed. He is obese. He is not diaphoretic.  HENT:     Head: Atraumatic.     Nose: Nose normal.     Mouth/Throat:     Mouth: Mucous membranes are moist.     Pharynx: Oropharynx is clear.  Eyes:     Conjunctiva/sclera: Conjunctivae normal.  Neck:     Thyroid: No thyromegaly.  Cardiovascular:     Rate and Rhythm: Normal rate and regular rhythm.     Heart sounds: Normal heart sounds.  Pulmonary:     Effort: Pulmonary effort is normal. No respiratory distress.     Breath sounds: Normal breath sounds.  Abdominal:     General: Bowel sounds are normal. There is no distension.     Palpations: Abdomen is soft.     Tenderness: There is no abdominal tenderness.  Musculoskeletal:        General: Normal range of motion.     Cervical back: Neck supple.  Lymphadenopathy:     Cervical: No cervical adenopathy.  Skin:    General: Skin is warm and dry.     Comments: Left upper fistula: + buitt and + thrill    Neurological:     Mental Status: He is alert. Mental status is at baseline.  Psychiatric:        Mood and Affect: Mood normal.      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 Thrombocytopenia: platelet 113  3. Aortic atherosclerosis (ct 12-30-21) is on asa statin   4. 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: is on hemodialysis three days weekly 1200 cc fluid restriction; does not adhere; midodrine 15 mg with dialysis; renvela 2400 mg three times daily   5. Other specified hypthyroidism: tsh 2.539 will continue synthroid 125 mcg daily   6. 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 Major  depression single episode, moderate: is currently off medications  8. Osteopenia; t score -2.052 is on supplements  9. GERD without esophagitis: will continue prilosec 40 mg daily   10. History of colon cancer: s/p colectomy last seen by GI on 12-19-21; ct scan done 12-30-21.   11. Dyslipidemia associated with type 2 diabetes mellitus: LDL 64 trig 319 will continue lipitor 20 mg daily lovaza 2 gm daily   12. Vitamin B 12 deficiency; is on daily supplement  13. Secondary hyperparathyroidism of renal origin: will monitor  14. Bilateral lower extremity edema: will continue demedex 20 mg 4 times weekly        Ok Edwards NP [facility]   call [phone]

## 2022-07-31 DIAGNOSIS — N250 Renal osteodystrophy: Secondary | ICD-10-CM | POA: Diagnosis not present

## 2022-07-31 DIAGNOSIS — N186 End stage renal disease: Secondary | ICD-10-CM | POA: Diagnosis not present

## 2022-07-31 DIAGNOSIS — D509 Iron deficiency anemia, unspecified: Secondary | ICD-10-CM | POA: Diagnosis not present

## 2022-07-31 DIAGNOSIS — Z992 Dependence on renal dialysis: Secondary | ICD-10-CM | POA: Diagnosis not present

## 2022-07-31 DIAGNOSIS — N2581 Secondary hyperparathyroidism of renal origin: Secondary | ICD-10-CM | POA: Diagnosis not present

## 2022-07-31 DIAGNOSIS — D631 Anemia in chronic kidney disease: Secondary | ICD-10-CM | POA: Diagnosis not present

## 2022-08-02 DIAGNOSIS — N2581 Secondary hyperparathyroidism of renal origin: Secondary | ICD-10-CM | POA: Diagnosis not present

## 2022-08-02 DIAGNOSIS — N186 End stage renal disease: Secondary | ICD-10-CM | POA: Diagnosis not present

## 2022-08-02 DIAGNOSIS — Z992 Dependence on renal dialysis: Secondary | ICD-10-CM | POA: Diagnosis not present

## 2022-08-02 DIAGNOSIS — D631 Anemia in chronic kidney disease: Secondary | ICD-10-CM | POA: Diagnosis not present

## 2022-08-02 DIAGNOSIS — N250 Renal osteodystrophy: Secondary | ICD-10-CM | POA: Diagnosis not present

## 2022-08-02 DIAGNOSIS — D509 Iron deficiency anemia, unspecified: Secondary | ICD-10-CM | POA: Diagnosis not present

## 2022-08-04 DIAGNOSIS — I120 Hypertensive chronic kidney disease with stage 5 chronic kidney disease or end stage renal disease: Secondary | ICD-10-CM | POA: Diagnosis not present

## 2022-08-04 DIAGNOSIS — Z1159 Encounter for screening for other viral diseases: Secondary | ICD-10-CM | POA: Diagnosis not present

## 2022-08-04 DIAGNOSIS — E1129 Type 2 diabetes mellitus with other diabetic kidney complication: Secondary | ICD-10-CM | POA: Diagnosis not present

## 2022-08-05 DIAGNOSIS — N2581 Secondary hyperparathyroidism of renal origin: Secondary | ICD-10-CM | POA: Diagnosis not present

## 2022-08-05 DIAGNOSIS — N250 Renal osteodystrophy: Secondary | ICD-10-CM | POA: Diagnosis not present

## 2022-08-05 DIAGNOSIS — D631 Anemia in chronic kidney disease: Secondary | ICD-10-CM | POA: Diagnosis not present

## 2022-08-05 DIAGNOSIS — Z992 Dependence on renal dialysis: Secondary | ICD-10-CM | POA: Diagnosis not present

## 2022-08-05 DIAGNOSIS — N186 End stage renal disease: Secondary | ICD-10-CM | POA: Diagnosis not present

## 2022-08-05 DIAGNOSIS — D509 Iron deficiency anemia, unspecified: Secondary | ICD-10-CM | POA: Diagnosis not present

## 2022-08-06 DIAGNOSIS — Z1159 Encounter for screening for other viral diseases: Secondary | ICD-10-CM | POA: Diagnosis not present

## 2022-08-06 DIAGNOSIS — E1129 Type 2 diabetes mellitus with other diabetic kidney complication: Secondary | ICD-10-CM | POA: Diagnosis not present

## 2022-08-06 DIAGNOSIS — I120 Hypertensive chronic kidney disease with stage 5 chronic kidney disease or end stage renal disease: Secondary | ICD-10-CM | POA: Diagnosis not present

## 2022-08-07 DIAGNOSIS — D631 Anemia in chronic kidney disease: Secondary | ICD-10-CM | POA: Diagnosis not present

## 2022-08-07 DIAGNOSIS — N186 End stage renal disease: Secondary | ICD-10-CM | POA: Diagnosis not present

## 2022-08-07 DIAGNOSIS — N2581 Secondary hyperparathyroidism of renal origin: Secondary | ICD-10-CM | POA: Diagnosis not present

## 2022-08-07 DIAGNOSIS — D509 Iron deficiency anemia, unspecified: Secondary | ICD-10-CM | POA: Diagnosis not present

## 2022-08-07 DIAGNOSIS — N250 Renal osteodystrophy: Secondary | ICD-10-CM | POA: Diagnosis not present

## 2022-08-07 DIAGNOSIS — Z992 Dependence on renal dialysis: Secondary | ICD-10-CM | POA: Diagnosis not present

## 2022-08-09 DIAGNOSIS — N186 End stage renal disease: Secondary | ICD-10-CM | POA: Diagnosis not present

## 2022-08-09 DIAGNOSIS — D631 Anemia in chronic kidney disease: Secondary | ICD-10-CM | POA: Diagnosis not present

## 2022-08-09 DIAGNOSIS — D509 Iron deficiency anemia, unspecified: Secondary | ICD-10-CM | POA: Diagnosis not present

## 2022-08-09 DIAGNOSIS — N2581 Secondary hyperparathyroidism of renal origin: Secondary | ICD-10-CM | POA: Diagnosis not present

## 2022-08-09 DIAGNOSIS — N250 Renal osteodystrophy: Secondary | ICD-10-CM | POA: Diagnosis not present

## 2022-08-09 DIAGNOSIS — Z992 Dependence on renal dialysis: Secondary | ICD-10-CM | POA: Diagnosis not present

## 2022-08-12 DIAGNOSIS — N250 Renal osteodystrophy: Secondary | ICD-10-CM | POA: Diagnosis not present

## 2022-08-12 DIAGNOSIS — D509 Iron deficiency anemia, unspecified: Secondary | ICD-10-CM | POA: Diagnosis not present

## 2022-08-12 DIAGNOSIS — Z992 Dependence on renal dialysis: Secondary | ICD-10-CM | POA: Diagnosis not present

## 2022-08-12 DIAGNOSIS — N186 End stage renal disease: Secondary | ICD-10-CM | POA: Diagnosis not present

## 2022-08-12 DIAGNOSIS — N2581 Secondary hyperparathyroidism of renal origin: Secondary | ICD-10-CM | POA: Diagnosis not present

## 2022-08-12 DIAGNOSIS — D631 Anemia in chronic kidney disease: Secondary | ICD-10-CM | POA: Diagnosis not present

## 2022-08-14 DIAGNOSIS — D631 Anemia in chronic kidney disease: Secondary | ICD-10-CM | POA: Diagnosis not present

## 2022-08-14 DIAGNOSIS — D509 Iron deficiency anemia, unspecified: Secondary | ICD-10-CM | POA: Diagnosis not present

## 2022-08-14 DIAGNOSIS — Z992 Dependence on renal dialysis: Secondary | ICD-10-CM | POA: Diagnosis not present

## 2022-08-14 DIAGNOSIS — N2581 Secondary hyperparathyroidism of renal origin: Secondary | ICD-10-CM | POA: Diagnosis not present

## 2022-08-14 DIAGNOSIS — N250 Renal osteodystrophy: Secondary | ICD-10-CM | POA: Diagnosis not present

## 2022-08-14 DIAGNOSIS — N186 End stage renal disease: Secondary | ICD-10-CM | POA: Diagnosis not present

## 2022-08-16 DIAGNOSIS — N250 Renal osteodystrophy: Secondary | ICD-10-CM | POA: Diagnosis not present

## 2022-08-16 DIAGNOSIS — N2581 Secondary hyperparathyroidism of renal origin: Secondary | ICD-10-CM | POA: Diagnosis not present

## 2022-08-16 DIAGNOSIS — Z992 Dependence on renal dialysis: Secondary | ICD-10-CM | POA: Diagnosis not present

## 2022-08-16 DIAGNOSIS — N186 End stage renal disease: Secondary | ICD-10-CM | POA: Diagnosis not present

## 2022-08-16 DIAGNOSIS — D509 Iron deficiency anemia, unspecified: Secondary | ICD-10-CM | POA: Diagnosis not present

## 2022-08-16 DIAGNOSIS — D631 Anemia in chronic kidney disease: Secondary | ICD-10-CM | POA: Diagnosis not present

## 2022-08-19 ENCOUNTER — Non-Acute Institutional Stay (SKILLED_NURSING_FACILITY): Payer: Medicare Other | Admitting: Internal Medicine

## 2022-08-19 ENCOUNTER — Encounter: Payer: Self-pay | Admitting: Internal Medicine

## 2022-08-19 DIAGNOSIS — I120 Hypertensive chronic kidney disease with stage 5 chronic kidney disease or end stage renal disease: Secondary | ICD-10-CM | POA: Diagnosis not present

## 2022-08-19 DIAGNOSIS — I951 Orthostatic hypotension: Secondary | ICD-10-CM

## 2022-08-19 DIAGNOSIS — N250 Renal osteodystrophy: Secondary | ICD-10-CM | POA: Diagnosis not present

## 2022-08-19 DIAGNOSIS — E1122 Type 2 diabetes mellitus with diabetic chronic kidney disease: Secondary | ICD-10-CM

## 2022-08-19 DIAGNOSIS — N186 End stage renal disease: Secondary | ICD-10-CM | POA: Diagnosis not present

## 2022-08-19 DIAGNOSIS — D509 Iron deficiency anemia, unspecified: Secondary | ICD-10-CM | POA: Diagnosis not present

## 2022-08-19 DIAGNOSIS — Z992 Dependence on renal dialysis: Secondary | ICD-10-CM | POA: Diagnosis not present

## 2022-08-19 DIAGNOSIS — D631 Anemia in chronic kidney disease: Secondary | ICD-10-CM | POA: Diagnosis not present

## 2022-08-19 DIAGNOSIS — N2581 Secondary hyperparathyroidism of renal origin: Secondary | ICD-10-CM | POA: Diagnosis not present

## 2022-08-19 DIAGNOSIS — E034 Atrophy of thyroid (acquired): Secondary | ICD-10-CM

## 2022-08-19 NOTE — Progress Notes (Unsigned)
NURSING HOME LOCATION:  [facility] ROOM NUMBER:  [id number] D  CODE STATUS:  Full Code  PCP:  Ok Edwards NP  This is a nursing facility follow up visit of chronic medical diagnoses & to document compliance with Regulation 483.30 (c) in The [place] Manual Phase 2 which mandates caregiver visit ( visits can alternate among physician, PA or NP as per statutes) within 10 days of 30 days / 60 days/ 90 days post admission to SNF date    Interim medical record and care since last SNF visit was updated with review of diagnostic studies and change in clinical status since last visit were documented.  HPI: He is a permanent resident of this facility with history of acute pyelonephritis, BPH, diabetes with end-stage renal disease on dialysis, history of DVT, GERD, essential hypertension, history of hypothyroidism, history of colorectal adenocarcinoma, and protein/caloric malnutrition. Recent procedures include colectomy, cystoscopy with urethral dilation, multiple vascular access procedures, EGD, thrombectomy with embolectomy, and TURP.  He has renal dialysis 3 times a week and labs are monitored at the dialysis center.  A1c is 5.8% in the context of the end-stage renal disease.  TSH is therapeutic at 2.539.  Review of systems: Complete review of systems was negative.  His responses are a monosyllabic "no."  Constitutional: No fever, significant weight change, fatigue  Eyes: No redness, discharge, pain, vision change ENT/mouth: No nasal congestion,  purulent discharge, earache, change in hearing, sore throat  Cardiovascular: No chest pain, palpitations, paroxysmal nocturnal dyspnea, claudication, edema  Respiratory: No cough, sputum production, hemoptysis, DOE, significant snoring, apnea   Gastrointestinal: No heartburn, dysphagia, abdominal pain, nausea /vomiting, rectal bleeding, melena, change in bowels Genitourinary: No dysuria, hematuria, pyuria, incontinence,  nocturia Musculoskeletal: No joint stiffness, joint swelling, weakness, pain Dermatologic: No rash, pruritus, change in appearance of skin Neurologic: No dizziness, headache, syncope, seizures, numbness, tingling Psychiatric: No significant anxiety, depression, insomnia, anorexia Endocrine: No change in hair/skin/nails, excessive thirst, excessive hunger, excessive urination  Hematologic/lymphatic: No significant bruising, lymphadenopathy, abnormal bleeding Allergy/immunology: No itchy/watery eyes, significant sneezing, urticaria, angioedema  Physical exam:  Pertinent or positive findings:  Most the time he exhibits almost a humming cadence to his speech which is slurred.  Slight exotropia is present on the right.  Dental hygiene is extremely poor with multiple missing maxillary teeth and severely plaque deposits of the mandibular teeth.  Pedal pulses are decreased to palpation.  There is trace edema at the sock line.  The right extremities are clinically stronger to opposition than the left.  He tends to use his feet to propel himself in the wheelchair.  The right thumbnail is distorted and discolored.  General appearance: Adequately nourished; no acute distress, increased work of breathing is present.   Lymphatic: No lymphadenopathy about the head, neck, axilla. Eyes: No conjunctival inflammation or lid edema is present. There is no scleral icterus. Ears:  External ear exam shows no significant lesions or deformities.   Nose:  External nasal examination shows no deformity or inflammation. Nasal mucosa are pink and moist without lesions, exudates Neck:  No thyromegaly, masses, tenderness noted.    Heart:  Normal rate and regular rhythm. S1 and S2 normal without gallop, murmur, click, rub .  Lungs: Chest clear to auscultation without wheezes, rhonchi, rales, rubs. Abdomen: Bowel sounds are normal. Abdomen is soft and nontender with no organomegaly, hernias, masses. GU: Deferred  Extremities:   No cyanosis, clubbing Neurologic exam :Balance, Rhomberg, finger to nose testing  could not be completed due to clinical state Skin: Warm & dry w/o tenting. No significant lesions or rash.  See summary under each active problem in the Problem List with associated updated therapeutic plan

## 2022-08-19 NOTE — Assessment & Plan Note (Signed)
TSH is therapeutic at 2.539; no change indicated.

## 2022-08-19 NOTE — Assessment & Plan Note (Signed)
Today's blood pressure is an outlier; systolic blood pressure serially has been as low as 105 with a high of 127.  No change indicated.

## 2022-08-19 NOTE — Patient Instructions (Signed)
See assessment and plan under each diagnosis in the problem list and acutely for this visit 

## 2022-08-19 NOTE — Assessment & Plan Note (Signed)
He continues renal dialysis 3 times weekly.  Labs are checked at dialysis.

## 2022-08-19 NOTE — Assessment & Plan Note (Addendum)
A1C is stable with a value of 5.8%, in the prediabetic range.  With his end-stage renal disease discordance is likely.

## 2022-08-20 DIAGNOSIS — F70 Mild intellectual disabilities: Secondary | ICD-10-CM | POA: Diagnosis not present

## 2022-08-20 DIAGNOSIS — Z23 Encounter for immunization: Secondary | ICD-10-CM | POA: Diagnosis not present

## 2022-08-20 DIAGNOSIS — F4322 Adjustment disorder with anxiety: Secondary | ICD-10-CM | POA: Diagnosis not present

## 2022-08-21 DIAGNOSIS — N2581 Secondary hyperparathyroidism of renal origin: Secondary | ICD-10-CM | POA: Diagnosis not present

## 2022-08-21 DIAGNOSIS — N186 End stage renal disease: Secondary | ICD-10-CM | POA: Diagnosis not present

## 2022-08-21 DIAGNOSIS — D631 Anemia in chronic kidney disease: Secondary | ICD-10-CM | POA: Diagnosis not present

## 2022-08-21 DIAGNOSIS — N250 Renal osteodystrophy: Secondary | ICD-10-CM | POA: Diagnosis not present

## 2022-08-21 DIAGNOSIS — Z992 Dependence on renal dialysis: Secondary | ICD-10-CM | POA: Diagnosis not present

## 2022-08-21 DIAGNOSIS — D509 Iron deficiency anemia, unspecified: Secondary | ICD-10-CM | POA: Diagnosis not present

## 2022-08-23 DIAGNOSIS — N2581 Secondary hyperparathyroidism of renal origin: Secondary | ICD-10-CM | POA: Diagnosis not present

## 2022-08-23 DIAGNOSIS — N250 Renal osteodystrophy: Secondary | ICD-10-CM | POA: Diagnosis not present

## 2022-08-23 DIAGNOSIS — D631 Anemia in chronic kidney disease: Secondary | ICD-10-CM | POA: Diagnosis not present

## 2022-08-23 DIAGNOSIS — Z992 Dependence on renal dialysis: Secondary | ICD-10-CM | POA: Diagnosis not present

## 2022-08-23 DIAGNOSIS — D509 Iron deficiency anemia, unspecified: Secondary | ICD-10-CM | POA: Diagnosis not present

## 2022-08-23 DIAGNOSIS — N186 End stage renal disease: Secondary | ICD-10-CM | POA: Diagnosis not present

## 2022-08-26 DIAGNOSIS — D509 Iron deficiency anemia, unspecified: Secondary | ICD-10-CM | POA: Diagnosis not present

## 2022-08-26 DIAGNOSIS — N2581 Secondary hyperparathyroidism of renal origin: Secondary | ICD-10-CM | POA: Diagnosis not present

## 2022-08-26 DIAGNOSIS — N186 End stage renal disease: Secondary | ICD-10-CM | POA: Diagnosis not present

## 2022-08-26 DIAGNOSIS — D631 Anemia in chronic kidney disease: Secondary | ICD-10-CM | POA: Diagnosis not present

## 2022-08-26 DIAGNOSIS — Z992 Dependence on renal dialysis: Secondary | ICD-10-CM | POA: Diagnosis not present

## 2022-08-26 DIAGNOSIS — N250 Renal osteodystrophy: Secondary | ICD-10-CM | POA: Diagnosis not present

## 2022-08-28 DIAGNOSIS — Z992 Dependence on renal dialysis: Secondary | ICD-10-CM | POA: Diagnosis not present

## 2022-08-28 DIAGNOSIS — N250 Renal osteodystrophy: Secondary | ICD-10-CM | POA: Diagnosis not present

## 2022-08-28 DIAGNOSIS — N186 End stage renal disease: Secondary | ICD-10-CM | POA: Diagnosis not present

## 2022-08-28 DIAGNOSIS — D631 Anemia in chronic kidney disease: Secondary | ICD-10-CM | POA: Diagnosis not present

## 2022-08-28 DIAGNOSIS — N2581 Secondary hyperparathyroidism of renal origin: Secondary | ICD-10-CM | POA: Diagnosis not present

## 2022-08-28 DIAGNOSIS — D509 Iron deficiency anemia, unspecified: Secondary | ICD-10-CM | POA: Diagnosis not present

## 2022-08-30 DIAGNOSIS — N186 End stage renal disease: Secondary | ICD-10-CM | POA: Diagnosis not present

## 2022-08-30 DIAGNOSIS — D631 Anemia in chronic kidney disease: Secondary | ICD-10-CM | POA: Diagnosis not present

## 2022-08-30 DIAGNOSIS — N2581 Secondary hyperparathyroidism of renal origin: Secondary | ICD-10-CM | POA: Diagnosis not present

## 2022-08-30 DIAGNOSIS — Z992 Dependence on renal dialysis: Secondary | ICD-10-CM | POA: Diagnosis not present

## 2022-08-30 DIAGNOSIS — D509 Iron deficiency anemia, unspecified: Secondary | ICD-10-CM | POA: Diagnosis not present

## 2022-08-30 DIAGNOSIS — N250 Renal osteodystrophy: Secondary | ICD-10-CM | POA: Diagnosis not present

## 2022-09-02 DIAGNOSIS — D631 Anemia in chronic kidney disease: Secondary | ICD-10-CM | POA: Diagnosis not present

## 2022-09-02 DIAGNOSIS — D509 Iron deficiency anemia, unspecified: Secondary | ICD-10-CM | POA: Diagnosis not present

## 2022-09-02 DIAGNOSIS — N2581 Secondary hyperparathyroidism of renal origin: Secondary | ICD-10-CM | POA: Diagnosis not present

## 2022-09-02 DIAGNOSIS — Z992 Dependence on renal dialysis: Secondary | ICD-10-CM | POA: Diagnosis not present

## 2022-09-02 DIAGNOSIS — N250 Renal osteodystrophy: Secondary | ICD-10-CM | POA: Diagnosis not present

## 2022-09-02 DIAGNOSIS — N186 End stage renal disease: Secondary | ICD-10-CM | POA: Diagnosis not present

## 2022-09-04 DIAGNOSIS — D509 Iron deficiency anemia, unspecified: Secondary | ICD-10-CM | POA: Diagnosis not present

## 2022-09-04 DIAGNOSIS — N186 End stage renal disease: Secondary | ICD-10-CM | POA: Diagnosis not present

## 2022-09-04 DIAGNOSIS — D631 Anemia in chronic kidney disease: Secondary | ICD-10-CM | POA: Diagnosis not present

## 2022-09-04 DIAGNOSIS — Z992 Dependence on renal dialysis: Secondary | ICD-10-CM | POA: Diagnosis not present

## 2022-09-04 DIAGNOSIS — N250 Renal osteodystrophy: Secondary | ICD-10-CM | POA: Diagnosis not present

## 2022-09-04 DIAGNOSIS — N2581 Secondary hyperparathyroidism of renal origin: Secondary | ICD-10-CM | POA: Diagnosis not present

## 2022-09-06 DIAGNOSIS — N186 End stage renal disease: Secondary | ICD-10-CM | POA: Diagnosis not present

## 2022-09-06 DIAGNOSIS — D509 Iron deficiency anemia, unspecified: Secondary | ICD-10-CM | POA: Diagnosis not present

## 2022-09-06 DIAGNOSIS — N2581 Secondary hyperparathyroidism of renal origin: Secondary | ICD-10-CM | POA: Diagnosis not present

## 2022-09-06 DIAGNOSIS — Z992 Dependence on renal dialysis: Secondary | ICD-10-CM | POA: Diagnosis not present

## 2022-09-06 DIAGNOSIS — N250 Renal osteodystrophy: Secondary | ICD-10-CM | POA: Diagnosis not present

## 2022-09-06 DIAGNOSIS — D631 Anemia in chronic kidney disease: Secondary | ICD-10-CM | POA: Diagnosis not present

## 2022-09-08 ENCOUNTER — Other Ambulatory Visit: Payer: Self-pay | Admitting: Adult Health

## 2022-09-09 DIAGNOSIS — N2581 Secondary hyperparathyroidism of renal origin: Secondary | ICD-10-CM | POA: Diagnosis not present

## 2022-09-09 DIAGNOSIS — N250 Renal osteodystrophy: Secondary | ICD-10-CM | POA: Diagnosis not present

## 2022-09-09 DIAGNOSIS — Z992 Dependence on renal dialysis: Secondary | ICD-10-CM | POA: Diagnosis not present

## 2022-09-09 DIAGNOSIS — D631 Anemia in chronic kidney disease: Secondary | ICD-10-CM | POA: Diagnosis not present

## 2022-09-09 DIAGNOSIS — D509 Iron deficiency anemia, unspecified: Secondary | ICD-10-CM | POA: Diagnosis not present

## 2022-09-09 DIAGNOSIS — N186 End stage renal disease: Secondary | ICD-10-CM | POA: Diagnosis not present

## 2022-09-11 DIAGNOSIS — N186 End stage renal disease: Secondary | ICD-10-CM | POA: Diagnosis not present

## 2022-09-11 DIAGNOSIS — Z992 Dependence on renal dialysis: Secondary | ICD-10-CM | POA: Diagnosis not present

## 2022-09-11 DIAGNOSIS — N250 Renal osteodystrophy: Secondary | ICD-10-CM | POA: Diagnosis not present

## 2022-09-11 DIAGNOSIS — N2581 Secondary hyperparathyroidism of renal origin: Secondary | ICD-10-CM | POA: Diagnosis not present

## 2022-09-11 DIAGNOSIS — D631 Anemia in chronic kidney disease: Secondary | ICD-10-CM | POA: Diagnosis not present

## 2022-09-11 DIAGNOSIS — D509 Iron deficiency anemia, unspecified: Secondary | ICD-10-CM | POA: Diagnosis not present

## 2022-09-12 ENCOUNTER — Non-Acute Institutional Stay (SKILLED_NURSING_FACILITY): Payer: Medicare Other | Admitting: Adult Health

## 2022-09-12 ENCOUNTER — Encounter: Payer: Self-pay | Admitting: Adult Health

## 2022-09-12 DIAGNOSIS — E1122 Type 2 diabetes mellitus with diabetic chronic kidney disease: Secondary | ICD-10-CM | POA: Diagnosis not present

## 2022-09-12 DIAGNOSIS — I700 Atherosclerosis of aorta: Secondary | ICD-10-CM

## 2022-09-12 DIAGNOSIS — N186 End stage renal disease: Secondary | ICD-10-CM | POA: Diagnosis not present

## 2022-09-12 DIAGNOSIS — I120 Hypertensive chronic kidney disease with stage 5 chronic kidney disease or end stage renal disease: Secondary | ICD-10-CM | POA: Diagnosis not present

## 2022-09-12 DIAGNOSIS — Z992 Dependence on renal dialysis: Secondary | ICD-10-CM | POA: Diagnosis not present

## 2022-09-12 DIAGNOSIS — N2581 Secondary hyperparathyroidism of renal origin: Secondary | ICD-10-CM

## 2022-09-12 NOTE — Progress Notes (Signed)
Location:  [facility] Room Number: 112 Place of Service:  SNF (31)   CODE STATUS: full   No Known Allergies  Chief Complaint  Patient presents with   Acute Visit    Care plan meeting    HPI:  We have come together for his care plan meeting. BIMS 8/15 mood 0/30. Uses wheelchair without falls. He is independent to supervision for his adl care. Occasional bladder incontinence and frequent bowel incontinence. Dietary: NAS CON CHO diet appetite 50-100% weight is 211 pounds feeds self; 1200 cc fluid restriction does not adhere. Therapy: none at this time. 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   Secondary hyperparathyroidism of renal origin   Past Medical History:  Diagnosis Date   Abnormal CT scan, kidney 10/06/2011   Acute pyelonephritis 10/07/2011   Anemia    normocytic   Anxiety    mental retardation   Bladder wall thickening 10/06/2011   BPH (benign prostatic hypertrophy)    Diabetes mellitus    Dialysis patient ([facility])    Tuesday, Thursday and Saturday,    DVT of leg (deep venous thrombosis) ([place]) 12/25/2016   Edema     history of lower extremity edema   GERD (gastroesophageal reflux disease)    Heme positive stool    Hydronephrosis    Hyperkalemia    Hyperlipidemia    Hypernatremia    Hypertension    Hypothyroidism    Impaired speech    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[place])    MR (mental retardation)    Muscle weakness    Obstructive uropathy    Perinephric abscess 10/07/2011   Poor historian poor historian   Primary colorectal adenocarcinoma ([place]) 04/2017   S/p total colectomy   Protein calorie malnutrition ([place])    Pyelonephritis    Renal failure (ARF), acute on chronic (HCC)    Renal insufficiency    chronic history   Sepsis ([place])    Smoking    Uremia    Urinary retention    UTI (lower  urinary tract infection) 10/06/2011    Past Surgical History:  Procedure Laterality Date   A/V FISTULAGRAM N/A 08/13/2018   Procedure: A/V FISTULAGRAM - Right Upper;  Surgeon: Elam Dutch, MD;  Location: [facility];  Service: Cardiovascular;  Laterality: N/A;   A/V FISTULAGRAM N/A 11/22/2018   Procedure: A/V FISTULAGRAM - Right Upper;  Surgeon: Waynetta Sandy, MD;  Location: [place] [facility];  Service: Cardiovascular;  Laterality: N/A;   AV FISTULA PLACEMENT Left 07/06/2015   Procedure:  INSERTION LEFT ARM ARTERIOVENOUS GORTEX GRAFT;  Surgeon: Angelia Mould, MD;  Location: [place];  Service: Vascular;  Laterality: Left;   AV FISTULA PLACEMENT Right 02/26/2016   Procedure: ARTERIOVENOUS (AV) FISTULA CREATION ;  Surgeon: Angelia Mould, MD;  Location: [place];  Service: Vascular;  Laterality: Right;   AV FISTULA PLACEMENT Right 11/25/2018   Procedure: INSERTION OF ARTERIOVENOUS (AV) ARTEGRAFT RIGHT UPPER ARM;  Surgeon: Waynetta Sandy, MD;  Location: [place];  Service: Vascular;  Laterality: Right;   AV FISTULA PLACEMENT Left 05/28/2021   Procedure: LEFT ARM ARTERIOVENOUS (AV) FISTULA;  Surgeon: Rosetta Posner, MD;  Location: [facility];  Service: Vascular;  Laterality: Left;   [place] Left 10/09/2015   Procedure: REMOVAL OF ARTERIOVENOUS GORETEX GRAFT ([place]) Evacuation of Lymphocele, Vein Patch  angioplasty of brachial artery.;  Surgeon: Angelia Mould, MD;  Location: [facility];  Service: Vascular;  Laterality: Left;   [place] Right 02/26/2016   Procedure: Right [place];  Surgeon: Angelia Mould, MD;  Location: [place];  Service: Vascular;  Laterality: Right;   [place] Left 07/30/2021   Procedure: LEFT ARM SECOND STAGE BASILIC VEIN TRANSPOSITION;  Surgeon: Rosetta Posner, MD;  Location: [facility];  Service: Vascular;  Laterality: Left;   CIRCUMCISION N/A 01/04/2014   Procedure:  CIRCUMCISION ADULT (procedure #1);  Surgeon: Marissa Nestle, MD;  Location[facility];  Service: Urology;  Laterality: N/A;   COLECTOMY N/A 05/04/2017   Procedure: TOTAL COLECTOMY;  Surgeon: Aviva Signs, MD;  Location: [facility];  Service: General;  Laterality: N/A;   COLONOSCOPY N/A 04/27/2017   Surgeon: Daneil Dolin, MD; annular mass in the ascending colon likely representing cancer biopsied, multiple 6-22 mm polyps removed, clean rectum.  Pathology with multiple tubular adenomas, high-grade dysplasia noted in ascending colon and splenic flexure.   CYSTOSCOPY W/ RETROGRADES Bilateral 06/29/2015   Procedure: CYSTOSCOPY, DILATION OF URETHRAL STRICTURE WITH BILATERAL RETROGRADE PYELOGRAM,SUPRAPUBIC TUBE CHANGE;  Surgeon: Festus Aloe, MD;  Location: [facility];  Service: Urology;  Laterality: Bilateral;   CYSTOSCOPY WITH URETHRAL DILATATION N/A 12/29/2013   Procedure: CYSTOSCOPY WITH URETHRAL DILATATION;  Surgeon: Marissa Nestle, MD;  [facility];  Service: Urology;  Laterality: N/A;   ESOPHAGOGASTRODUODENOSCOPY N/A 04/27/2017   Procedure: ESOPHAGOGASTRODUODENOSCOPY (EGD);  Surgeon: Daneil Dolin, MD;  [facility];  Service: Endoscopy;  Laterality: N/A;   FLEXIBLE SIGMOIDOSCOPY N/A 12/02/2021   Procedure: FLEXIBLE SIGMOIDOSCOPY;  Surgeon: Eloise Harman, DO;  [facility];  Service: Endoscopy;  Laterality: N/A;  10:30am, dialysis patient   INSERTION OF DIALYSIS CATHETER Right 11/25/2018   Procedure: INSERTION OF DIALYSIS CATHETER RIGHT INTERNAL JUGULAR;  Surgeon: Waynetta Sandy, MD;  Location: [place];  Service: Vascular;  Laterality: Right;   IR AV DIALY SHUNT INTRO NEEDLE/INTRACATH INITIAL W/PTA/IMG RIGHT Right 09/07/2018   IR FLUORO GUIDE CV LINE RIGHT  10/16/2020   IR REMOVAL TUN CV CATH W/O FL  01/12/2019   IR THROMBECTOMY AV FISTULA W/THROMBOLYSIS/PTA INC/SHUNT/IMG RIGHT Right 04/26/2018   IR US GUIDE VASC ACCESS RIGHT  04/26/2018   IR US GUIDE  VASC ACCESS RIGHT  09/07/2018   IR US GUIDE VASC ACCESS RIGHT  10/16/2020   IR US GUIDE VASC ACCESS RIGHT  10/16/2020   ORIF FEMUR FRACTURE Right 11/22/2016   Procedure: OPEN REDUCTION INTERNAL FIXATION (ORIF) DISTAL FEMUR FRACTURE;  Surgeon: [place] Can, MD;  Location: [facility];  Service: Orthopedics;  Laterality: Right;   PATCH ANGIOPLASTY Right 12/10/2017   Procedure: PATCH ANGIOPLASTY;  Surgeon: Angelia Mould, MD;  Location: [place];  Service: Vascular;  Laterality: Right;   PERIPHERAL VASCULAR BALLOON ANGIOPLASTY  08/13/2018   Procedure: PERIPHERAL VASCULAR BALLOON ANGIOPLASTY;  Surgeon: Elam Dutch, MD;  Location: [place] [facility];  Service: Cardiovascular;;  right AV fistula    PERIPHERAL VASCULAR BALLOON ANGIOPLASTY  11/22/2018   Procedure: PERIPHERAL VASCULAR BALLOON ANGIOPLASTY;  Surgeon: Waynetta Sandy, MD;  Location: [place] [facility];  Service: Cardiovascular;;  rt AV fistula   PERIPHERAL VASCULAR CATHETERIZATION N/A 10/08/2015   Procedure: A/V Shuntogram;  Surgeon: Angelia Mould, MD;  Location: [place] [facility];  Service: Cardiovascular;  Laterality: N/A;   REMOVAL OF A DIALYSIS CATHETER N/A 12/03/2021   Procedure: MINOR REMOVAL  OF A TUNNELED DIALYSIS CATHETER;  Surgeon: [facility] Posner, MD;  [facility];  Service: Vascular;  Laterality: N/A;   THROMBECTOMY W/ EMBOLECTOMY Right 12/10/2017   Procedure: THROMBECTOMY REVISION RIGHT ARM  ARTERIOVENOUS FISTULA;  Surgeon: Angelia Mould, MD;  Location: [facility];  Service: Vascular;  Laterality: Right;   TRANSURETHRAL RESECTION OF PROSTATE N/A 01/04/2014   Procedure: TRANSURETHRAL RESECTION OF THE PROSTATE (TURP) (procedure #2);  Surgeon: Marissa Nestle, MD;  [facility];  Service: Urology;  Laterality: N/A;    Social History   Socioeconomic History   Marital status: Single    Spouse name: Not on file   Number of children: Not on file   Years of education: Not on file   Highest  education level: Not on file  Occupational History   Occupation: retired   Tobacco Use   Smoking status: Never   Smokeless tobacco: Never  Vaping Use   Vaping Use: Never used  Substance and Sexual Activity   Alcohol use: No   Drug use: No   Sexual activity: Not Currently  Other Topics Concern   Not on file  Social History Narrative   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   [facility] resident of [facility]    Social Determinants of Health   Financial Resource Strain: Low Risk  (02/18/2018)   Overall Financial Resource Strain (CARDIA)    Difficulty of Paying Living Expenses: Not hard at all  Food Insecurity: No Food Insecurity (02/18/2018)   Hunger Vital Sign    Worried About Running Out of Food in the Last Year: Never true    Ran Out of Food in the Last Year: Never true  Transportation Needs: No Transportation Needs (02/18/2018)   PRAPARE - Hydrologist (Medical): No    Lack of Transportation (Non-Medical): No  Physical Activity: Unknown (09/15/2019)   Exercise Vital Sign    Days of Exercise per Week: Not on file    Minutes of Exercise per Session: 0 min  Stress: No Stress Concern Present (02/18/2018)   [place]    Feeling of Stress : Not at all  Social Connections: Unknown (09/15/2019)   Social Connection and Isolation Panel [NHANES]    Frequency of Communication with Friends and Family: Not on file    Frequency of Social Gatherings with Friends and Family: Never    Attends Religious Services: Not on file    Active Member of Clubs or Organizations: Not on file    Attends Archivist Meetings: Not on file    Marital Status: Not on file  Intimate Partner Violence: Not At Risk (02/18/2018)   Humiliation, Afraid, Rape, and Kick questionnaire    Fear of Current or Ex-Partner: No    Emotionally Abused: No    Physically Abused: No    Sexually  Abused: No   Family History  Problem Relation Age of Onset   Cancer Mother    Colon cancer Neg Hx       VITAL SIGNS BP (!) 140/72   Pulse 74   Temp 98.6 F (37 C)   Resp 18   Ht '5\' 8"'$  (1.727 m)   Wt 211 lb (95.7 kg)   SpO2 98%   BMI 32.08 kg/m   Outpatient Encounter Medications as of 09/12/2022  Medication Sig   aspirin EC 81 MG tablet Take 81 mg by mouth daily. 0900   atorvastatin (LIPITOR)  20 MG tablet Take 20 mg by mouth at bedtime. 2000   Insulin Glargine (BASAGLAR KWIKPEN) 100 UNIT/ML SOPN Inject 20 Units into the skin at bedtime. 2100 DO NOT HOLD this insulin without provider notification   Insulin Pen Needle (BD AUTOSHIELD DUO) 30G X 5 MM MISC by Does not apply route. 3/16"   levothyroxine (SYNTHROID) 125 MCG tablet Take 125 mcg by mouth at bedtime. 2100   midodrine (PROAMATINE) 10 MG tablet Take 10 mg by mouth See admin instructions. Send med with resident on  Tues/Thurs/Sat to dialysis. do not give at [facility], give to resident to take with him. send with '5mg'$  to equal '15mg'$ . 6:00   midodrine (PROAMATINE) 5 MG tablet Take 5 mg by mouth See admin instructions. Send with resident on dialysis days.  Tues/Thurs/Sat  Take along with 10 mg to equal 15 mg 6:00   NON FORMULARY Diet: NAS, Cons CHO   omega-3 acid ethyl esters (LOVAZA) 1 g capsule Take 2 g by mouth at bedtime. 2100   omeprazole (PRILOSEC) 40 MG capsule Take 40 mg by mouth daily. 0900   polyethylene glycol (MIRALAX / GLYCOLAX) packet Take 17 g by mouth daily as needed for mild constipation.    sevelamer carbonate (RENVELA) 800 MG tablet Take 2,400 mg by mouth in the morning and at bedtime.   sevelamer carbonate (RENVELA) 800 MG tablet Take 2,400 mg by mouth daily.   tamsulosin (FLOMAX) 0.4 MG CAPS capsule Take 0.4 mg by mouth daily at 6 PM. Give 30 minutes after a meal. Do not crush or chew   torsemide (DEMADEX) 20 MG tablet Take 20 mg by mouth as directed. Sunday, Monday, Wednesday, and Friday   vitamin B-12  (CYANOCOBALAMIN) 1000 MCG tablet Take 1,000 mcg by mouth daily.   No facility-administered encounter medications on file as of 09/12/2022.     SIGNIFICANT DIAGNOSTIC EXAMS  PREVIOUS   12-30-21: ct of abdomen and pelvis:  1. No significant change in size or appearance of the hypodense lesion in the head of the pancreas, possible cyst or IPMN. Recommend continued follow-up with contrast-enhanced MRI or CT in 2 years. 2. Cholelithiasis. 3. Left renal calculus. Bilateral renal mild atrophy with multiple renal cysts. 4. Other chronic findings as described.  06-11-22: dexa: t score: -2.052  NO NEW EXAMS.    LABS REVIEWED PREVIOUS;    11-07-21: wbc 4,5 hgb 9.5; hct 29.5; mcv 103.9 plt 107; glucose 129; bun 28; creat 9.01; k+ 4.5; na++ 138; ca 8.6; GFR 6; liver normal albumin 3.1; hgb a1c 5.8; chol 60 ldl 18; trig 71; hdl 28; tsh 1.538  11-22-21: hgb 9,3; hct 28.4; iron 80; tibc 203; ferritin 474; vitamin B 12: 272 folate 6.6  12-02-21: hgb 10.5; hct 31.0; glucose 85; bun 25; creat 9.80; k+ 3.9; na++ 140;   01-28-22; wbc 4.8; hgb 8.7; hct 26.0; mcv 105.3; plt 113; glucose 1; bun 40; creat 11.35; k+ 3.8; na++ 141; ca 8.8; GFR 4 d-dimer 0.43; CRP <0.5   TODAY  06-19-22: hgb a1c 5.8; tsh 2.539 free t4: 0.98  Review of Systems  Constitutional:  Negative for malaise/fatigue.  Respiratory:  Negative for cough and shortness of breath.   Cardiovascular:  Negative for chest pain, palpitations and leg swelling.  Gastrointestinal:  Negative for abdominal pain, constipation and heartburn.  Musculoskeletal:  Negative for back pain, joint pain and myalgias.  Skin: Negative.   Neurological:  Negative for dizziness.  Psychiatric/Behavioral:  The patient is not nervous/anxious.  Physical Exam Constitutional:      General: He is not in acute distress.    Appearance: He is well-developed. He is not diaphoretic.  Neck:     Thyroid: No thyromegaly.  Cardiovascular:     Rate and Rhythm: Normal rate and  regular rhythm.     Pulses: Normal pulses.     Heart sounds: Normal heart sounds.  Pulmonary:     Effort: Pulmonary effort is normal. No respiratory distress.     Breath sounds: Normal breath sounds.  Abdominal:     General: Bowel sounds are normal. There is no distension.     Palpations: Abdomen is soft.     Tenderness: There is no abdominal tenderness.  Musculoskeletal:        General: Normal range of motion.     Cervical back: Neck supple.     Right lower leg: No edema.     Left lower leg: No edema.     Comments:  Left upper fistula  Lymphadenopathy:     Cervical: No cervical adenopathy.  Skin:    General: Skin is warm and dry.  Neurological:     Mental Status: He is alert. Mental status is at baseline.  Psychiatric:        Mood and Affect: Mood normal.       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   Time spent with patient: 40 minutes plan of care medications.    Ok Edwards NP [facility]  call [phone]

## 2022-09-13 DIAGNOSIS — D509 Iron deficiency anemia, unspecified: Secondary | ICD-10-CM | POA: Diagnosis not present

## 2022-09-13 DIAGNOSIS — D631 Anemia in chronic kidney disease: Secondary | ICD-10-CM | POA: Diagnosis not present

## 2022-09-13 DIAGNOSIS — N250 Renal osteodystrophy: Secondary | ICD-10-CM | POA: Diagnosis not present

## 2022-09-13 DIAGNOSIS — Z992 Dependence on renal dialysis: Secondary | ICD-10-CM | POA: Diagnosis not present

## 2022-09-13 DIAGNOSIS — N186 End stage renal disease: Secondary | ICD-10-CM | POA: Diagnosis not present

## 2022-09-13 DIAGNOSIS — N2581 Secondary hyperparathyroidism of renal origin: Secondary | ICD-10-CM | POA: Diagnosis not present

## 2022-09-16 DIAGNOSIS — D631 Anemia in chronic kidney disease: Secondary | ICD-10-CM | POA: Diagnosis not present

## 2022-09-16 DIAGNOSIS — N186 End stage renal disease: Secondary | ICD-10-CM | POA: Diagnosis not present

## 2022-09-16 DIAGNOSIS — D509 Iron deficiency anemia, unspecified: Secondary | ICD-10-CM | POA: Diagnosis not present

## 2022-09-16 DIAGNOSIS — N2581 Secondary hyperparathyroidism of renal origin: Secondary | ICD-10-CM | POA: Diagnosis not present

## 2022-09-16 DIAGNOSIS — Z992 Dependence on renal dialysis: Secondary | ICD-10-CM | POA: Diagnosis not present

## 2022-09-16 DIAGNOSIS — N250 Renal osteodystrophy: Secondary | ICD-10-CM | POA: Diagnosis not present

## 2022-09-18 DIAGNOSIS — D509 Iron deficiency anemia, unspecified: Secondary | ICD-10-CM | POA: Diagnosis not present

## 2022-09-18 DIAGNOSIS — N250 Renal osteodystrophy: Secondary | ICD-10-CM | POA: Diagnosis not present

## 2022-09-18 DIAGNOSIS — D631 Anemia in chronic kidney disease: Secondary | ICD-10-CM | POA: Diagnosis not present

## 2022-09-18 DIAGNOSIS — Z992 Dependence on renal dialysis: Secondary | ICD-10-CM | POA: Diagnosis not present

## 2022-09-18 DIAGNOSIS — N2581 Secondary hyperparathyroidism of renal origin: Secondary | ICD-10-CM | POA: Diagnosis not present

## 2022-09-18 DIAGNOSIS — N186 End stage renal disease: Secondary | ICD-10-CM | POA: Diagnosis not present

## 2022-09-20 DIAGNOSIS — N186 End stage renal disease: Secondary | ICD-10-CM | POA: Diagnosis not present

## 2022-09-20 DIAGNOSIS — D509 Iron deficiency anemia, unspecified: Secondary | ICD-10-CM | POA: Diagnosis not present

## 2022-09-20 DIAGNOSIS — D631 Anemia in chronic kidney disease: Secondary | ICD-10-CM | POA: Diagnosis not present

## 2022-09-20 DIAGNOSIS — N250 Renal osteodystrophy: Secondary | ICD-10-CM | POA: Diagnosis not present

## 2022-09-20 DIAGNOSIS — N2581 Secondary hyperparathyroidism of renal origin: Secondary | ICD-10-CM | POA: Diagnosis not present

## 2022-09-20 DIAGNOSIS — Z992 Dependence on renal dialysis: Secondary | ICD-10-CM | POA: Diagnosis not present

## 2022-09-23 DIAGNOSIS — I871 Compression of vein: Secondary | ICD-10-CM | POA: Diagnosis not present

## 2022-09-23 DIAGNOSIS — N250 Renal osteodystrophy: Secondary | ICD-10-CM | POA: Diagnosis not present

## 2022-09-23 DIAGNOSIS — D509 Iron deficiency anemia, unspecified: Secondary | ICD-10-CM | POA: Diagnosis not present

## 2022-09-23 DIAGNOSIS — Z992 Dependence on renal dialysis: Secondary | ICD-10-CM | POA: Diagnosis not present

## 2022-09-23 DIAGNOSIS — N186 End stage renal disease: Secondary | ICD-10-CM | POA: Diagnosis not present

## 2022-09-23 DIAGNOSIS — D631 Anemia in chronic kidney disease: Secondary | ICD-10-CM | POA: Diagnosis not present

## 2022-09-23 DIAGNOSIS — N2581 Secondary hyperparathyroidism of renal origin: Secondary | ICD-10-CM | POA: Diagnosis not present

## 2022-09-24 ENCOUNTER — Non-Acute Institutional Stay (SKILLED_NURSING_FACILITY): Payer: Medicare Other | Admitting: Adult Health

## 2022-09-24 ENCOUNTER — Encounter: Payer: Self-pay | Admitting: Adult Health

## 2022-09-24 DIAGNOSIS — D696 Thrombocytopenia, unspecified: Secondary | ICD-10-CM

## 2022-09-24 DIAGNOSIS — I120 Hypertensive chronic kidney disease with stage 5 chronic kidney disease or end stage renal disease: Secondary | ICD-10-CM | POA: Diagnosis not present

## 2022-09-24 DIAGNOSIS — Z992 Dependence on renal dialysis: Secondary | ICD-10-CM

## 2022-09-24 DIAGNOSIS — E1122 Type 2 diabetes mellitus with diabetic chronic kidney disease: Secondary | ICD-10-CM | POA: Diagnosis not present

## 2022-09-24 DIAGNOSIS — N186 End stage renal disease: Secondary | ICD-10-CM | POA: Diagnosis not present

## 2022-09-24 DIAGNOSIS — I700 Atherosclerosis of aorta: Secondary | ICD-10-CM | POA: Diagnosis not present

## 2022-09-24 DIAGNOSIS — Z794 Long term (current) use of insulin: Secondary | ICD-10-CM

## 2022-09-24 NOTE — Progress Notes (Signed)
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 He continues to get out of bed daily; has a good appetite; no reports of uncontrolled pain.   Past Medical History:  Diagnosis Date   Abnormal CT scan, kidney 10/06/2011   Acute pyelonephritis 10/07/2011   Anemia    normocytic   Anxiety    mental retardation   Bladder wall thickening 10/06/2011   BPH (benign prostatic hypertrophy)    Diabetes mellitus    Dialysis patient ([place])    Tuesday, Thursday and Saturday,    DVT of leg (deep venous thrombosis) ([place]) 12/25/2016   Edema     history of lower extremity edema   GERD (gastroesophageal reflux disease)    Heme positive stool    Hydronephrosis    Hyperkalemia    Hyperlipidemia    Hypernatremia    Hypertension    Hypothyroidism    Impaired speech    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)    Smoking    Uremia    Urinary retention    UTI (lower urinary tract infection) 10/06/2011    Past Surgical History:  Procedure Laterality Date   A/V FISTULAGRAM N/A 08/13/2018   Procedure: A/V FISTULAGRAM - Right Upper;  Surgeon: Elam Dutch, MD;  Location: [facility];  Service: Cardiovascular;  Laterality: N/A;   A/V FISTULAGRAM N/A 11/22/2018   Procedure: A/V FISTULAGRAM - Right Upper;  Surgeon: Waynetta Sandy, MD;  Location: [place] [facility];  Service: Cardiovascular;  Laterality: N/A;   AV FISTULA PLACEMENT Left 07/06/2015   Procedure:  INSERTION LEFT ARM ARTERIOVENOUS GORTEX GRAFT;  Surgeon: Angelia Mould, MD;  Location: [place];  Service: Vascular;  Laterality: Left;   AV FISTULA PLACEMENT Right 02/26/2016   Procedure: ARTERIOVENOUS (AV) FISTULA CREATION ;  Surgeon: Angelia Mould, MD;  Location: [place];  Service: Vascular;  Laterality: Right;   AV FISTULA PLACEMENT Right 11/25/2018   Procedure: INSERTION OF ARTERIOVENOUS (AV) ARTEGRAFT RIGHT UPPER ARM;  Surgeon: Waynetta Sandy, MD;  Location: [facility];  Service: Vascular;  Laterality: Right;   AV FISTULA PLACEMENT Left 05/28/2021   Procedure: LEFT ARM ARTERIOVENOUS (AV) FISTULA;  Surgeon: Rosetta Posner, MD;  [facility];  Service: Vascular;  Laterality: Left;   [place] Left 10/09/2015   Procedure: REMOVAL OF ARTERIOVENOUS GORETEX GRAFT ([place]) Evacuation of Lymphocele, Vein Patch angioplasty of brachial artery.;  Surgeon: Angelia Mould, MD;  Location: [place];  Service: Vascular;  Laterality: Left;   [place] Right 02/26/2016   Procedure: Right [place];  Surgeon: Angelia Mould, MD;  Location: [place];  Service: Vascular;  Laterality: Right;   [place] Left 07/30/2021   Procedure: LEFT ARM SECOND STAGE BASILIC VEIN TRANSPOSITION;  Surgeon: Rosetta Posner, MD;  [facility];   Service: Vascular;  Laterality: Left;   CIRCUMCISION N/A 01/04/2014   Procedure: CIRCUMCISION ADULT (procedure #1);  Surgeon: Marissa Nestle, MD;  [facility];  Service: Urology;  Laterality: N/A;   COLECTOMY N/A 05/04/2017   Procedure: TOTAL COLECTOMY;  Surgeon: Aviva Signs, MD;  [facility];  Service: General;  Laterality: N/A;   COLONOSCOPY N/A 04/27/2017   Surgeon: Daneil Dolin, MD; annular mass in the ascending colon likely representing cancer biopsied, multiple 6-22 mm polyps removed, clean rectum.  Pathology with multiple tubular adenomas, high-grade dysplasia noted in ascending colon and splenic flexure.   CYSTOSCOPY W/ RETROGRADES Bilateral 06/29/2015   Procedure: CYSTOSCOPY, DILATION OF URETHRAL STRICTURE WITH BILATERAL RETROGRADE PYELOGRAM,SUPRAPUBIC TUBE CHANGE;  Surgeon: Festus Aloe, MD;  Location: [facility];  Service: Urology;  Laterality: Bilateral;   CYSTOSCOPY WITH URETHRAL DILATATION N/A 12/29/2013   Procedure: CYSTOSCOPY WITH URETHRAL DILATATION;  Surgeon: Marissa Nestle, MD;  [facility];  Service: Urology;  Laterality: N/A;   ESOPHAGOGASTRODUODENOSCOPY N/A 04/27/2017   Procedure: ESOPHAGOGASTRODUODENOSCOPY (EGD);  Surgeon: Daneil Dolin, MD;  [facility];  Service: Endoscopy;  Laterality: N/A;   FLEXIBLE SIGMOIDOSCOPY N/A 12/02/2021   Procedure: FLEXIBLE SIGMOIDOSCOPY;  Surgeon: Eloise Harman, DO;  [facility];  Service: Endoscopy;  Laterality: N/A;  10:30am, dialysis patient   INSERTION OF DIALYSIS CATHETER Right 11/25/2018   Procedure: INSERTION OF DIALYSIS CATHETER RIGHT INTERNAL JUGULAR;  Surgeon: Waynetta Sandy, MD;  Location: [place];  Service: Vascular;  Laterality: Right;   IR AV DIALY SHUNT INTRO NEEDLE/INTRACATH INITIAL W/PTA/IMG RIGHT Right 09/07/2018   IR FLUORO GUIDE CV LINE RIGHT  10/16/2020   IR REMOVAL TUN CV CATH W/O FL  01/12/2019   IR THROMBECTOMY AV FISTULA W/THROMBOLYSIS/PTA INC/SHUNT/IMG  RIGHT Right 04/26/2018   IR US GUIDE VASC ACCESS RIGHT  04/26/2018   IR US GUIDE VASC ACCESS RIGHT  09/07/2018   IR US GUIDE VASC ACCESS RIGHT  10/16/2020   IR US GUIDE VASC ACCESS RIGHT  10/16/2020   ORIF FEMUR FRACTURE Right 11/22/2016   Procedure: OPEN REDUCTION INTERNAL FIXATION (ORIF) DISTAL FEMUR FRACTURE;  Surgeon: Rod Can, MD;  Location: [facility];  Service: Orthopedics;  Laterality: Right;   PATCH ANGIOPLASTY Right 12/10/2017   Procedure: PATCH ANGIOPLASTY;  Surgeon: Angelia Mould, MD;  Location: [place];  Service: Vascular;  Laterality: Right;   PERIPHERAL VASCULAR BALLOON ANGIOPLASTY  08/13/2018   Procedure: PERIPHERAL VASCULAR BALLOON ANGIOPLASTY;  Surgeon: Elam Dutch, MD;  Location: [place] [facility];  Service: Cardiovascular;;  right AV fistula    PERIPHERAL VASCULAR BALLOON ANGIOPLASTY  11/22/2018   Procedure: PERIPHERAL VASCULAR BALLOON ANGIOPLASTY;  Surgeon: Waynetta Sandy, MD;  Location: [place] [facility];  Service: Cardiovascular;;  rt AV fistula   PERIPHERAL VASCULAR CATHETERIZATION N/A 10/08/2015   Procedure: A/V Shuntogram;  Surgeon: Angelia Mould, MD;  Location: [place] [facility];  Service: Cardiovascular;  Laterality: N/A;   REMOVAL OF A DIALYSIS CATHETER N/A 12/03/2021   Procedure: MINOR REMOVAL OF A TUNNELED DIALYSIS CATHETER;  Surgeon: Rosetta Posner, MD;  [facility];  Service: Vascular;  Laterality: N/A;   THROMBECTOMY W/ EMBOLECTOMY Right 12/10/2017   Procedure: THROMBECTOMY REVISION RIGHT ARM  ARTERIOVENOUS FISTULA;  Surgeon: Angelia Mould, MD;  Location: [facility];  Service: Vascular;  Laterality: Right;   TRANSURETHRAL RESECTION OF PROSTATE N/A 01/04/2014   Procedure: TRANSURETHRAL RESECTION OF THE PROSTATE (TURP) (procedure #2);  Surgeon: Marissa Nestle, MD;  [facility];  Service: Urology;  Laterality: N/A;    Social History   Socioeconomic History   Marital status: Single    Spouse name: Not on file    Number of children: Not on file   Years of education: Not on file   Highest education level: Not on file  Occupational History   Occupation: retired   Tobacco Use   Smoking status: Never   Smokeless tobacco: Never  Vaping Use   Vaping Use: Never used  Substance and Sexual Activity   Alcohol use: No   Drug use: No   Sexual activity: Not Currently  Other Topics Concern   Not on file  Social History Narrative   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   Long term resident of [facility]    Social Determinants of Health   Financial Resource Strain: Low Risk  (02/18/2018)   Overall Financial Resource Strain (CARDIA)    Difficulty of Paying Living Expenses: Not hard at all  Food Insecurity: No Food Insecurity (02/18/2018)   Hunger Vital Sign    Worried About Running Out of Food in the Last Year: Never true    Ran Out of Food in the Last Year: Never true  Transportation Needs: No Transportation Needs (02/18/2018)   PRAPARE - Hydrologist (Medical): No    Lack of Transportation (Non-Medical): No  Physical Activity: Unknown (09/15/2019)   Exercise Vital Sign    Days of Exercise per Week: Not on file    Minutes of Exercise per Session: 0 min  Stress: No Stress Concern Present (02/18/2018)   [place]    Feeling of Stress : Not at all  Social Connections: Unknown (09/15/2019)   Social Connection and Isolation Panel [NHANES]    Frequency of Communication with Friends and Family: Not on file    Frequency of Social Gatherings with Friends and Family: Never    Attends Religious Services: Not on file    Active Member of Clubs or Organizations: Not on file    Attends Archivist Meetings: Not on file    Marital Status: Not on file  Intimate Partner Violence: Not At Risk (02/18/2018)   Humiliation, Afraid, Rape, and Kick questionnaire    Fear of Current or  Ex-Partner: No    Emotionally Abused: No    Physically Abused: No    Sexually Abused: No   Family History  Problem Relation Age of Onset   Cancer Mother    Colon cancer Neg Hx       VITAL SIGNS BP 113/67   Pulse 64   Temp (!) 97.1 F (36.2 C)   Resp 18   Ht '5\' 8"'$  (1.727 m)   Wt 211 lb (95.7 kg)   SpO2 98%   BMI 32.08 kg/m   Outpatient Encounter  Medications as of 09/24/2022  Medication Sig   aspirin EC 81 MG tablet Take 81 mg by mouth daily. 0900   atorvastatin (LIPITOR) 20 MG tablet Take 20 mg by mouth at bedtime. 2000   Insulin Glargine (BASAGLAR KWIKPEN) 100 UNIT/ML SOPN Inject 20 Units into the skin at bedtime. 2100 DO NOT HOLD this insulin without provider notification   Insulin Pen Needle (BD AUTOSHIELD DUO) 30G X 5 MM MISC by Does not apply route. 3/16"   levothyroxine (SYNTHROID) 125 MCG tablet Take 125 mcg by mouth at bedtime. 2100   midodrine (PROAMATINE) 10 MG tablet Take 10 mg by mouth See admin instructions. Send med with resident on  Tues/Thurs/Sat to dialysis. do not give at [facility], give to resident to take with him. send with '5mg'$  to equal '15mg'$ . 6:00   midodrine (PROAMATINE) 5 MG tablet Take 5 mg by mouth See admin instructions. Send with resident on dialysis days.  Tues/Thurs/Sat  Take along with 10 mg to equal 15 mg 6:00   NON FORMULARY Diet: NAS, Cons CHO   omega-3 acid ethyl esters (LOVAZA) 1 g capsule Take 2 g by mouth at bedtime. 2100   omeprazole (PRILOSEC) 40 MG capsule Take 40 mg by mouth daily. 0900   polyethylene glycol (MIRALAX / GLYCOLAX) packet Take 17 g by mouth daily as needed for mild constipation.    sevelamer carbonate (RENVELA) 800 MG tablet Take 2,400 mg by mouth in the morning and at bedtime.   sevelamer carbonate (RENVELA) 800 MG tablet Take 2,400 mg by mouth daily.   tamsulosin (FLOMAX) 0.4 MG CAPS capsule Take 0.4 mg by mouth daily at 6 PM. Give 30 minutes after a meal. Do not crush or chew   torsemide (DEMADEX) 20 MG tablet Take 20  mg by mouth as directed. Sunday, Monday, Wednesday, and Friday   vitamin B-12 (CYANOCOBALAMIN) 1000 MCG tablet Take 1,000 mcg by mouth daily.   No facility-administered encounter medications on file as of 09/24/2022.     SIGNIFICANT DIAGNOSTIC EXAMS  PREVIOUS   12-30-21: ct of abdomen and pelvis:  1. No significant change in size or appearance of the hypodense lesion in the head of the pancreas, possible cyst or IPMN. Recommend continued follow-up with contrast-enhanced MRI or CT in 2 years. 2. Cholelithiasis. 3. Left renal calculus. Bilateral renal mild atrophy with multiple renal cysts. 4. Other chronic findings as described.  06-11-22: dexa: t score: -2.052  NO NEW EXAMS.    LABS REVIEWED PREVIOUS;    07-30-21: hgb 11.6; hct 34.0; glucose 104; bun 12; creat 7.30; k+ 5.2; na++ 135; ionized ca++ 0.8  08-12-21: tsh 0.822 11-07-21: wbc 4,5 hgb 9.5; hct 29.5; mcv 103.9 plt 107; glucose 129; bun 28; creat 9.01; k+ 4.5; na++ 138; ca 8.6; GFR 6; liver normal albumin 3.1; hgb a1c 5.8; chol 60 ldl 18; trig 71; hdl 28; tsh 1.538  11-22-21: hgb 9,3; hct 28.4; iron 80; tibc 203; ferritin 474; vitamin B 12: 272 folate 6.6  12-02-21: hgb 10.5; hct 31.0; glucose 85; bun 25; creat 9.80; k+ 3.9; na++ 140;   01-28-22; wbc 4.8; hgb 8.7; hct 26.0; mcv 105.3; plt 113; glucose 1; bun 40; creat 11.35; k+ 3.8; na++ 141; ca 8.8; GFR 4 d-dimer 0.43; CRP <0.5   TODAY  06-19-22: hgb a1c 5.8; tsh 2.539 free t4: 0.98  Review of Systems  Constitutional:  Negative for malaise/fatigue.  Respiratory:  Negative for cough and shortness of breath.   Cardiovascular:  Negative for chest pain,  palpitations and leg swelling.  Gastrointestinal:  Negative for abdominal pain, constipation and heartburn.  Musculoskeletal:  Negative for back pain, joint pain and myalgias.  Skin: Negative.   Neurological:  Negative for dizziness.  Psychiatric/Behavioral:  The patient is not nervous/anxious.    Physical Exam Constitutional:       General: He is not in acute distress.    Appearance: He is well-developed. He is not diaphoretic.  Neck:     Thyroid: No thyromegaly.  Cardiovascular:     Rate and Rhythm: Normal rate and regular rhythm.     Pulses: Normal pulses.     Heart sounds: Normal heart sounds.  Pulmonary:     Effort: Pulmonary effort is normal. No respiratory distress.     Breath sounds: Normal breath sounds.  Abdominal:     General: Bowel sounds are normal. There is no distension.     Palpations: Abdomen is soft.     Tenderness: There is no abdominal tenderness.  Musculoskeletal:        General: Normal range of motion.     Cervical back: Neck supple.     Right lower leg: No edema.     Left lower leg: No edema.  Lymphadenopathy:     Cervical: No cervical adenopathy.  Skin:    General: Skin is warm and dry.     Comments: Left upper fistula   Neurological:     Mental Status: He is alert.  Psychiatric:        Mood and Affect: Mood normal.        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 Thrombocytopenia: platelet 113  3. Aortic atherosclerosis (ct 12-30-21) is on asa statin   4. 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 Other specified hypthyroidism: tsh 2.539 will continue synthroid 125 mcg daily   6. 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 Major depression single episode, moderate: is currently off medications  8. Osteopenia; t score -2.052 is on supplements  9. GERD without esophagitis: will continue prilosec 40 mg daily   10. History of colon cancer: s/p colectomy last seen by GI on 12-19-21;  ct scan done 12-30-21.   11. Dyslipidemia associated with type 2 diabetes mellitus: LDL 64 trig 319 will continue lipitor 20 mg daily lovaza 2 gm daily   12. Vitamin B 12 deficiency; is on daily supplement  13. Secondary hyperparathyroidism of renal origin: will monitor  14. Bilateral lower extremity edema: will continue demedex 20 mg 4 times weekly     Ok Edwards NP [facility]   call [phone]

## 2022-09-25 DIAGNOSIS — D509 Iron deficiency anemia, unspecified: Secondary | ICD-10-CM | POA: Diagnosis not present

## 2022-09-25 DIAGNOSIS — N186 End stage renal disease: Secondary | ICD-10-CM | POA: Diagnosis not present

## 2022-09-25 DIAGNOSIS — N2581 Secondary hyperparathyroidism of renal origin: Secondary | ICD-10-CM | POA: Diagnosis not present

## 2022-09-25 DIAGNOSIS — Z992 Dependence on renal dialysis: Secondary | ICD-10-CM | POA: Diagnosis not present

## 2022-09-25 DIAGNOSIS — D631 Anemia in chronic kidney disease: Secondary | ICD-10-CM | POA: Diagnosis not present

## 2022-09-25 DIAGNOSIS — N250 Renal osteodystrophy: Secondary | ICD-10-CM | POA: Diagnosis not present

## 2022-09-27 DIAGNOSIS — D509 Iron deficiency anemia, unspecified: Secondary | ICD-10-CM | POA: Diagnosis not present

## 2022-09-27 DIAGNOSIS — N186 End stage renal disease: Secondary | ICD-10-CM | POA: Diagnosis not present

## 2022-09-27 DIAGNOSIS — D631 Anemia in chronic kidney disease: Secondary | ICD-10-CM | POA: Diagnosis not present

## 2022-09-27 DIAGNOSIS — N2581 Secondary hyperparathyroidism of renal origin: Secondary | ICD-10-CM | POA: Diagnosis not present

## 2022-09-27 DIAGNOSIS — Z992 Dependence on renal dialysis: Secondary | ICD-10-CM | POA: Diagnosis not present

## 2022-09-29 ENCOUNTER — Non-Acute Institutional Stay (SKILLED_NURSING_FACILITY): Payer: Medicare Other | Admitting: Adult Health

## 2022-09-29 ENCOUNTER — Encounter: Payer: Self-pay | Admitting: Family Medicine

## 2022-09-29 ENCOUNTER — Encounter: Payer: Self-pay | Admitting: Adult Health

## 2022-09-29 ENCOUNTER — Non-Acute Institutional Stay (INDEPENDENT_AMBULATORY_CARE_PROVIDER_SITE_OTHER): Payer: Medicare Other | Admitting: Family Medicine

## 2022-09-29 ENCOUNTER — Other Ambulatory Visit (HOSPITAL_COMMUNITY)
Admission: RE | Admit: 2022-09-29 | Discharge: 2022-09-29 | Disposition: A | Discharge status: Home / Self Care | Payer: Medicare Other | Source: Skilled Nursing Facility | Attending: Adult Health | Admitting: Adult Health

## 2022-09-29 DIAGNOSIS — T82858A Stenosis of vascular prosthetic devices, implants and grafts, initial encounter: Secondary | ICD-10-CM | POA: Diagnosis not present

## 2022-09-29 DIAGNOSIS — D696 Thrombocytopenia, unspecified: Secondary | ICD-10-CM | POA: Diagnosis not present

## 2022-09-29 DIAGNOSIS — D631 Anemia in chronic kidney disease: Secondary | ICD-10-CM | POA: Diagnosis not present

## 2022-09-29 DIAGNOSIS — E034 Atrophy of thyroid (acquired): Secondary | ICD-10-CM

## 2022-09-29 DIAGNOSIS — E11319 Type 2 diabetes mellitus with unspecified diabetic retinopathy without macular edema: Secondary | ICD-10-CM | POA: Diagnosis not present

## 2022-09-29 DIAGNOSIS — E7800 Pure hypercholesterolemia, unspecified: Secondary | ICD-10-CM | POA: Diagnosis present

## 2022-09-29 DIAGNOSIS — N2581 Secondary hyperparathyroidism of renal origin: Secondary | ICD-10-CM | POA: Diagnosis not present

## 2022-09-29 DIAGNOSIS — R4182 Altered mental status, unspecified: Secondary | ICD-10-CM | POA: Diagnosis not present

## 2022-09-29 DIAGNOSIS — E1122 Type 2 diabetes mellitus with diabetic chronic kidney disease: Secondary | ICD-10-CM | POA: Diagnosis not present

## 2022-09-29 DIAGNOSIS — E669 Obesity, unspecified: Secondary | ICD-10-CM | POA: Diagnosis not present

## 2022-09-29 DIAGNOSIS — Z6831 Body mass index (BMI) 31.0-31.9, adult: Secondary | ICD-10-CM | POA: Diagnosis not present

## 2022-09-29 DIAGNOSIS — N250 Renal osteodystrophy: Secondary | ICD-10-CM | POA: Diagnosis not present

## 2022-09-29 DIAGNOSIS — E1129 Type 2 diabetes mellitus with other diabetic kidney complication: Secondary | ICD-10-CM | POA: Insufficient documentation

## 2022-09-29 DIAGNOSIS — Z992 Dependence on renal dialysis: Secondary | ICD-10-CM | POA: Diagnosis not present

## 2022-09-29 DIAGNOSIS — Z86718 Personal history of other venous thrombosis and embolism: Secondary | ICD-10-CM | POA: Diagnosis not present

## 2022-09-29 DIAGNOSIS — T82590A Other mechanical complication of surgically created arteriovenous fistula, initial encounter: Secondary | ICD-10-CM | POA: Diagnosis not present

## 2022-09-29 DIAGNOSIS — F79 Unspecified intellectual disabilities: Secondary | ICD-10-CM | POA: Diagnosis not present

## 2022-09-29 DIAGNOSIS — N401 Enlarged prostate with lower urinary tract symptoms: Secondary | ICD-10-CM | POA: Diagnosis not present

## 2022-09-29 DIAGNOSIS — Z794 Long term (current) use of insulin: Secondary | ICD-10-CM | POA: Insufficient documentation

## 2022-09-29 DIAGNOSIS — Y832 Surgical operation with anastomosis, bypass or graft as the cause of abnormal reaction of the patient, or of later complication, without mention of misadventure at the time of the procedure: Secondary | ICD-10-CM | POA: Diagnosis present

## 2022-09-29 DIAGNOSIS — E8889 Other specified metabolic disorders: Secondary | ICD-10-CM | POA: Diagnosis not present

## 2022-09-29 DIAGNOSIS — Z85048 Personal history of other malignant neoplasm of rectum, rectosigmoid junction, and anus: Secondary | ICD-10-CM | POA: Diagnosis not present

## 2022-09-29 DIAGNOSIS — K219 Gastro-esophageal reflux disease without esophagitis: Secondary | ICD-10-CM | POA: Diagnosis present

## 2022-09-29 DIAGNOSIS — I951 Orthostatic hypotension: Secondary | ICD-10-CM | POA: Diagnosis present

## 2022-09-29 DIAGNOSIS — R338 Other retention of urine: Secondary | ICD-10-CM | POA: Diagnosis not present

## 2022-09-29 DIAGNOSIS — E875 Hyperkalemia: Secondary | ICD-10-CM | POA: Diagnosis not present

## 2022-09-29 DIAGNOSIS — E1169 Type 2 diabetes mellitus with other specified complication: Secondary | ICD-10-CM | POA: Diagnosis present

## 2022-09-29 DIAGNOSIS — I120 Hypertensive chronic kidney disease with stage 5 chronic kidney disease or end stage renal disease: Secondary | ICD-10-CM | POA: Diagnosis not present

## 2022-09-29 DIAGNOSIS — E039 Hypothyroidism, unspecified: Secondary | ICD-10-CM | POA: Diagnosis not present

## 2022-09-29 DIAGNOSIS — E8779 Other fluid overload: Secondary | ICD-10-CM | POA: Diagnosis not present

## 2022-09-29 DIAGNOSIS — R531 Weakness: Secondary | ICD-10-CM | POA: Diagnosis not present

## 2022-09-29 DIAGNOSIS — N186 End stage renal disease: Secondary | ICD-10-CM | POA: Diagnosis not present

## 2022-09-29 DIAGNOSIS — Z7401 Bed confinement status: Secondary | ICD-10-CM | POA: Diagnosis not present

## 2022-09-29 DIAGNOSIS — Z4901 Encounter for fitting and adjustment of extracorporeal dialysis catheter: Secondary | ICD-10-CM | POA: Diagnosis not present

## 2022-09-29 DIAGNOSIS — J811 Chronic pulmonary edema: Secondary | ICD-10-CM | POA: Diagnosis not present

## 2022-09-29 LAB — LIPID PANEL
Cholesterol: 89 mg/dL (ref 0–200)
HDL: 35 mg/dL — ABNORMAL LOW (ref >40)
LDL Cholesterol: 41 mg/dL (ref 0–99)
Total CHOL/HDL Ratio: 2.5 RATIO
Triglycerides: 63 mg/dL (ref <150)
VLDL: 13 mg/dL (ref 0–40)

## 2022-09-29 LAB — COMPREHENSIVE METABOLIC PANEL
ALT: 12 U/L (ref 0–44)
AST: 14 U/L — ABNORMAL LOW (ref 15–41)
Albumin: 3.5 g/dL (ref 3.5–5.0)
Alkaline Phosphatase: 54 U/L (ref 38–126)
Anion gap: 12 (ref 5–15)
BUN: 44 mg/dL — ABNORMAL HIGH (ref 8–23)
CO2: 21 mmol/L — ABNORMAL LOW (ref 22–32)
Calcium: 9.3 mg/dL (ref 8.9–10.3)
Chloride: 106 mmol/L (ref 98–111)
Creatinine, Ser: 16.87 mg/dL — ABNORMAL HIGH (ref 0.61–1.24)
GFR, Estimated: 3 mL/min — ABNORMAL LOW (ref >60)
Glucose, Bld: 112 mg/dL — ABNORMAL HIGH (ref 70–99)
Potassium: 4.7 mmol/L (ref 3.5–5.1)
Sodium: 139 mmol/L (ref 135–145)
Total Bilirubin: 0.6 mg/dL (ref 0.3–1.2)
Total Protein: 7 g/dL (ref 6.5–8.1)

## 2022-09-29 LAB — CBC
HCT: 41.1 % (ref 39.0–52.0)
Hemoglobin: 12.9 g/dL — ABNORMAL LOW (ref 13.0–17.0)
MCH: 34 pg (ref 26.0–34.0)
MCHC: 31.4 g/dL (ref 30.0–36.0)
MCV: 108.4 fL — ABNORMAL HIGH (ref 80.0–100.0)
Platelets: 108 10*3/uL — ABNORMAL LOW (ref 150–400)
RBC: 3.79 MIL/uL — ABNORMAL LOW (ref 4.22–5.81)
RDW: 15.9 % — ABNORMAL HIGH (ref 11.5–15.5)
WBC: 4.3 10*3/uL (ref 4.0–10.5)
nRBC: 0 % (ref 0.0–0.2)

## 2022-09-29 LAB — TSH: TSH: 5.861 u[IU]/mL — ABNORMAL HIGH (ref 0.350–4.500)

## 2022-09-29 NOTE — Progress Notes (Unsigned)
Location:  [facility] Room Number: 112-D Place of Service:  SNF (31)   CODE STATUS: Full Code  No Known Allergies  Chief Complaint  Patient presents with   Acute Visit    Elevated TSH    HPI:  His tsh is elevated at 5.861. there are no reports of missed doses. He is taking synthroid with other medications. Will need to change the time of day in order to better manage his medication regimen. There are no reports of uncontrolled pain. He states that his appetite is good.   Past Medical History:  Diagnosis Date   Abnormal CT scan, kidney 10/06/2011   Acute pyelonephritis 10/07/2011   Anemia    normocytic   Anxiety    mental retardation   Bladder wall thickening 10/06/2011   BPH (benign prostatic hypertrophy)    Diabetes mellitus    Dialysis patient ([place])    Tuesday, Thursday and Saturday,    DVT of leg (deep venous thrombosis) ([place]) 12/25/2016   Edema     history of lower extremity edema   GERD (gastroesophageal reflux disease)    Heme positive stool    Hydronephrosis    Hyperkalemia    Hyperlipidemia    Hypernatremia    Hypertension    Hypothyroidism    Impaired speech    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[place])    MR (mental retardation)    Muscle weakness    Obstructive uropathy    Perinephric abscess 10/07/2011   Poor historian poor historian   Primary colorectal adenocarcinoma ([place]) 04/2017   S/p total colectomy   Protein calorie malnutrition ([place])    Pyelonephritis    Renal failure (ARF), acute on chronic (HCC)    Renal insufficiency    chronic history   Sepsis ([place])    Smoking    Uremia    Urinary retention    UTI (lower urinary tract infection) 10/06/2011    Past Surgical History:  Procedure Laterality Date   A/V FISTULAGRAM N/A 08/13/2018   Procedure: A/V FISTULAGRAM - Right Upper;  Surgeon: Elam Dutch, MD;  Location: [facility];  Service: Cardiovascular;  Laterality: N/A;   A/V FISTULAGRAM N/A 11/22/2018    Procedure: A/V FISTULAGRAM - Right Upper;  Surgeon: Waynetta Sandy, MD;  Location: [facility];  Service: Cardiovascular;  Laterality: N/A;   AV FISTULA PLACEMENT Left 07/06/2015   Procedure:  INSERTION LEFT ARM ARTERIOVENOUS GORTEX GRAFT;  Surgeon: Angelia Mould, MD;  Location: [facility];  Service: Vascular;  Laterality: Left;   AV FISTULA PLACEMENT Right 02/26/2016   Procedure: ARTERIOVENOUS (AV) FISTULA CREATION ;  Surgeon: Angelia Mould, MD;  Location: [facility];  Service: Vascular;  Laterality: Right;   AV FISTULA PLACEMENT Right 11/25/2018   Procedure: INSERTION OF ARTERIOVENOUS (AV) ARTEGRAFT RIGHT UPPER ARM;  Surgeon: Waynetta Sandy, MD;  Location: [facility];  Service: Vascular;  Laterality: Right;   AV FISTULA PLACEMENT Left 05/28/2021   Procedure: LEFT ARM ARTERIOVENOUS (AV) FISTULA;  Surgeon: Rosetta Posner, MD;  [facility];  Service: Vascular;  Laterality: Left;   [place] REMOVAL Left 10/09/2015   Procedure: REMOVAL OF ARTERIOVENOUS GORETEX GRAFT ([place]) Evacuation of Lymphocele, Vein Patch angioplasty of brachial artery.;  Surgeon: Angelia Mould, MD;  Location: [place] [facility];  Service: Vascular;  Laterality: Left;   [place] Right 02/26/2016   Procedure: Right [place];  Surgeon: Angelia Mould, MD;  Location: [facility];  Service:  Vascular;  Laterality: Right;   BASCILIC VEIN TRANSPOSITION Left 07/30/2021   Procedure: LEFT ARM SECOND STAGE BASILIC VEIN TRANSPOSITION;  Surgeon: Rosetta Posner, MD;  [facility];  Service: Vascular;  Laterality: Left;   CIRCUMCISION N/A 01/04/2014   Procedure: CIRCUMCISION ADULT (procedure #1);  Surgeon: Marissa Nestle, MD;  [facility];  Service: Urology;  Laterality: N/A;   COLECTOMY N/A 05/04/2017   Procedure: TOTAL COLECTOMY;  Surgeon: Aviva Signs, MD;  Location[facility];  Service: General;  Laterality: N/A;   COLONOSCOPY N/A 04/27/2017   Surgeon: Daneil Dolin, MD; annular mass in the ascending colon likely representing cancer biopsied, multiple 6-22 mm polyps removed, clean rectum.  Pathology with multiple tubular adenomas, high-grade dysplasia noted in ascending colon and splenic flexure.   CYSTOSCOPY W/ RETROGRADES Bilateral 06/29/2015   Procedure: CYSTOSCOPY, DILATION OF URETHRAL STRICTURE WITH BILATERAL RETROGRADE PYELOGRAM,SUPRAPUBIC TUBE CHANGE;  Surgeon: Festus Aloe, MD;  Location: [facility];  Service: Urology;  Laterality: Bilateral;   CYSTOSCOPY WITH URETHRAL DILATATION N/A 12/29/2013   Procedure: CYSTOSCOPY WITH URETHRAL DILATATION;  Surgeon: Marissa Nestle, MD;  [facility];  Service: Urology;  Laterality: N/A;   ESOPHAGOGASTRODUODENOSCOPY N/A 04/27/2017   Procedure: ESOPHAGOGASTRODUODENOSCOPY (EGD);  Surgeon: Daneil Dolin, MD;  [facility];  Service: Endoscopy;  Laterality: N/A;   FLEXIBLE SIGMOIDOSCOPY N/A 12/02/2021   Procedure: FLEXIBLE SIGMOIDOSCOPY;  Surgeon: Eloise Harman, DO;  [facility];  Service: Endoscopy;  Laterality: N/A;  10:30am, dialysis patient   INSERTION OF DIALYSIS CATHETER Right 11/25/2018   Procedure: INSERTION OF DIALYSIS CATHETER RIGHT INTERNAL JUGULAR;  Surgeon: Waynetta Sandy, MD;  Location: [facility];  Service: Vascular;  Laterality: Right;   IR AV DIALY SHUNT INTRO NEEDLE/INTRACATH INITIAL W/PTA/IMG RIGHT Right 09/07/2018   IR FLUORO GUIDE CV LINE RIGHT  10/16/2020   IR REMOVAL TUN CV CATH W/O FL  01/12/2019   IR THROMBECTOMY AV FISTULA W/THROMBOLYSIS/PTA INC/SHUNT/IMG RIGHT Right 04/26/2018   IR US GUIDE VASC ACCESS RIGHT  04/26/2018   IR US GUIDE VASC ACCESS RIGHT  09/07/2018   IR US GUIDE VASC ACCESS RIGHT  10/16/2020   IR US GUIDE VASC ACCESS RIGHT  10/16/2020   ORIF FEMUR FRACTURE Right 11/22/2016   Procedure: OPEN REDUCTION INTERNAL FIXATION (ORIF) DISTAL FEMUR FRACTURE;  Surgeon: Rod Can, MD;  Location: [facility];  Service: Orthopedics;   Laterality: Right;   PATCH ANGIOPLASTY Right 12/10/2017   Procedure: PATCH ANGIOPLASTY;  Surgeon: Angelia Mould, MD;  Location: [facility];  Service: Vascular;  Laterality: Right;   PERIPHERAL VASCULAR BALLOON ANGIOPLASTY  08/13/2018   Procedure: PERIPHERAL VASCULAR BALLOON ANGIOPLASTY;  Surgeon: Elam Dutch, MD;  Location: [facility];  Service: Cardiovascular;;  right AV fistula    PERIPHERAL VASCULAR BALLOON ANGIOPLASTY  11/22/2018   Procedure: PERIPHERAL VASCULAR BALLOON ANGIOPLASTY;  Surgeon: Waynetta Sandy, MD;  Location: [facility];  Service: Cardiovascular;;  rt AV fistula   PERIPHERAL VASCULAR CATHETERIZATION N/A 10/08/2015   Procedure: A/V Shuntogram;  Surgeon: Angelia Mould, MD;  Location: [facility];  Service: Cardiovascular;  Laterality: N/A;   REMOVAL OF A DIALYSIS CATHETER N/A 12/03/2021   Procedure: MINOR REMOVAL OF A TUNNELED DIALYSIS CATHETER;  Surgeon: Rosetta Posner, MD;  [facility];  Service: Vascular;  Laterality: N/A;   THROMBECTOMY W/ EMBOLECTOMY Right 12/10/2017   Procedure: THROMBECTOMY REVISION RIGHT ARM  ARTERIOVENOUS FISTULA;  Surgeon: Angelia Mould, MD;  [facility];  Service: Vascular;  Laterality: Right;   TRANSURETHRAL RESECTION OF PROSTATE N/A 01/04/2014   Procedure: TRANSURETHRAL RESECTION OF THE PROSTATE (TURP) (procedure #2);  Surgeon: Marissa Nestle, MD;  [facility];  Service: Urology;  Laterality: N/A;    Social History   Socioeconomic History   Marital status: Single    Spouse name: Not on file   Number of children: Not on file   Years of education: Not on file   Highest education level: Not on file  Occupational History   Occupation: retired   Tobacco Use   Smoking status: Never   Smokeless tobacco: Never  Vaping Use   Vaping Use: Never used  Substance and Sexual Activity   Alcohol use: No   Drug use: No   Sexual activity: Not Currently  Other Topics Concern    Not on file  Social History Narrative   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   [facility] resident of [facility]    Social Determinants of Health   Financial Resource Strain: Low Risk  (02/18/2018)   Overall Financial Resource Strain (CARDIA)    Difficulty of Paying Living Expenses: Not hard at all  Food Insecurity: No Food Insecurity (02/18/2018)   Hunger Vital Sign    Worried About Running Out of Food in the Last Year: Never true    Ran Out of Food in the Last Year: Never true  Transportation Needs: No Transportation Needs (02/18/2018)   PRAPARE - Hydrologist (Medical): No    Lack of Transportation (Non-Medical): No  Physical Activity: Unknown (09/15/2019)   Exercise Vital Sign    Days of Exercise per Week: Not on file    Minutes of Exercise per Session: 0 min  Stress: No Stress Concern Present (02/18/2018)   [place]    Feeling of Stress : Not at all  Social Connections: Unknown (09/15/2019)   Social Connection and Isolation Panel [NHANES]    Frequency of Communication with Friends and Family: Not on file    Frequency of Social Gatherings with Friends and Family: Never    Attends Religious Services: Not on file    Active Member of Clubs or Organizations: Not on file    Attends Archivist Meetings: Not on file    Marital Status: Not on file  Intimate Partner Violence: Not At Risk (02/18/2018)   Humiliation, Afraid, Rape, and Kick questionnaire    Fear of Current or Ex-Partner: No    Emotionally Abused: No    Physically Abused: No    Sexually Abused: No   Family History  Problem Relation Age of Onset   Cancer Mother    Colon cancer Neg Hx       VITAL SIGNS BP 136/68   Pulse 68   Temp 97.6 F (36.4 C)   Resp 18   Ht '5\' 8"'$  (1.727 m)   Wt 205 lb 12.8 oz (93.4 kg)   SpO2 100%   BMI 31.29 kg/m   Outpatient Encounter Medications as  of 09/29/2022  Medication Sig   aspirin EC 81 MG tablet Take 81 mg by mouth daily. 0900   atorvastatin (LIPITOR) 20 MG tablet Take 20 mg by mouth at bedtime. 2000   Insulin Glargine (BASAGLAR KWIKPEN) 100 UNIT/ML SOPN Inject 20 Units into the skin at bedtime. 2100 DO NOT HOLD this insulin without provider notification   Insulin Pen Needle (BD AUTOSHIELD DUO)  30G X 5 MM MISC by Does not apply route. 3/16"   midodrine (PROAMATINE) 10 MG tablet Take 10 mg by mouth See admin instructions. Send med with resident on  Tues/Thurs/Sat to dialysis. do not give at [facility], give to resident to take with him. send with '5mg'$  to equal '15mg'$ . 6:00   midodrine (PROAMATINE) 5 MG tablet Take 5 mg by mouth See admin instructions. Send with resident on dialysis days.  Tues/Thurs/Sat  Take along with 10 mg to equal 15 mg 6:00   NON FORMULARY Diet: NAS, Cons CHO   omega-3 acid ethyl esters (LOVAZA) 1 g capsule Take 2 g by mouth at bedtime. 2100   omeprazole (PRILOSEC) 40 MG capsule Take 40 mg by mouth daily. 0900   polyethylene glycol (MIRALAX / GLYCOLAX) packet Take 17 g by mouth daily as needed for mild constipation.    sevelamer carbonate (RENVELA) 800 MG tablet Take 2,400 mg by mouth in the morning and at bedtime.   sevelamer carbonate (RENVELA) 800 MG tablet Take 2,400 mg by mouth daily.   tamsulosin (FLOMAX) 0.4 MG CAPS capsule Take 0.4 mg by mouth daily at 6 PM. Give 30 minutes after a meal. Do not crush or chew   torsemide (DEMADEX) 20 MG tablet Take 20 mg by mouth as directed. Sunday, Monday, Wednesday, and Friday   vitamin B-12 (CYANOCOBALAMIN) 1000 MCG tablet Take 1,000 mcg by mouth daily.   levothyroxine (SYNTHROID) 125 MCG tablet Take 125 mcg by mouth at bedtime. 2100   No facility-administered encounter medications on file as of 09/29/2022.     SIGNIFICANT DIAGNOSTIC EXAMS  PREVIOUS   12-30-21: ct of abdomen and pelvis:  1. No significant change in size or appearance of the hypodense lesion in the  head of the pancreas, possible cyst or IPMN. Recommend continued follow-up with contrast-enhanced MRI or CT in 2 years. 2. Cholelithiasis. 3. Left renal calculus. Bilateral renal mild atrophy with multiple renal cysts. 4. Other chronic findings as described.  06-11-22: dexa: t score: -2.052  NO NEW EXAMS.    LABS REVIEWED PREVIOUS;    11-07-21: wbc 4,5 hgb 9.5; hct 29.5; mcv 103.9 plt 107; glucose 129; bun 28; creat 9.01; k+ 4.5; na++ 138; ca 8.6; GFR 6; liver normal albumin 3.1; hgb a1c 5.8; chol 60 ldl 18; trig 71; hdl 28; tsh 1.538  11-22-21: hgb 9,3; hct 28.4; iron 80; tibc 203; ferritin 474; vitamin B 12: 272 folate 6.6  12-02-21: hgb 10.5; hct 31.0; glucose 85; bun 25; creat 9.80; k+ 3.9; na++ 140;   01-28-22; wbc 4.8; hgb 8.7; hct 26.0; mcv 105.3; plt 113; glucose 1; bun 40; creat 11.35; k+ 3.8; na++ 141; ca 8.8; GFR 4 d-dimer 0.43; CRP <0.5  06-19-22: hgb a1c 5.8; tsh 2.539 free t4: 0.98  TODAY  09-29-22: wbc 4.3; hgb 12.9; hct 41.1; mcv 108.4 plt 108; glucose 112; bun 44; creat 16.87; k+ 4.7; na++ 139; ca 9.3; gfr 3; protein 7.0; albumin 3.5 hgb A1c 5.0; tsh 5.861; chol 89; ldl 41; trig 63; hdl 35   Review of Systems  Constitutional:  Negative for malaise/fatigue.  Respiratory:  Negative for cough and shortness of breath.   Cardiovascular:  Negative for chest pain, palpitations and leg swelling.  Gastrointestinal:  Negative for abdominal pain, constipation and heartburn.  Musculoskeletal:  Negative for back pain, joint pain and myalgias.  Skin: Negative.   Neurological:  Negative for dizziness.  Psychiatric/Behavioral:  The patient is not nervous/anxious.     Physical Exam  Constitutional:      General: He is not in acute distress.    Appearance: He is well-developed. He is not diaphoretic.  Neck:     Thyroid: No thyromegaly.  Cardiovascular:     Rate and Rhythm: Normal rate and regular rhythm.     Pulses: Normal pulses.     Heart sounds: Normal heart sounds.  Pulmonary:      Effort: Pulmonary effort is normal. No respiratory distress.     Breath sounds: Normal breath sounds.  Abdominal:     General: Bowel sounds are normal. There is no distension.     Palpations: Abdomen is soft.     Tenderness: There is no abdominal tenderness.  Musculoskeletal:        General: Normal range of motion.     Cervical back: Neck supple.     Right lower leg: No edema.     Left lower leg: No edema.  Lymphadenopathy:     Cervical: No cervical adenopathy.  Skin:    General: Skin is warm and dry.     Comments: Left upper arm fistula   Neurological:     Mental Status: He is alert. Mental status is at baseline.  Psychiatric:        Mood and Affect: Mood normal.       ASSESSMENT/ PLAN:  TODAY  Hypothyroidism due to acquired atrophy of thyroid: is 5.861; will continue synthroid 125 mcg daily and will change to the time to 2 pm; in between meals and without other medications.    Ok Edwards NP [facility]   call [phone]

## 2022-09-29 NOTE — Assessment & Plan Note (Signed)
Established problem Uncontrolled, TSH elevated 5.86 from prior normal level in August   TSH elevation concerning for inadequate serum levothyroxine   There may be a contribution from concurrently administered meds and snacks with patient's levothyroxine at 9 pm.  Plan: Change levothyroxine administration to 2 pm.           Recheck TSH in 4 to 6 weeks, if TSH still elevated, increased dose of levothyroxine may be needed.

## 2022-09-29 NOTE — Progress Notes (Signed)
Location:  [place] of Service:  [place]: Full Code  No Known Allergies  Chief Complaint  Patient presents with   Abnormal Lab    HPI:  Long-standing Hypo thyroid (2008) Levothyroxine dose 125 microgram daily TSH 5.86 09/29/22.  Previously 2.539 (05/30/22)  Levothyroxine administered at 9 pm Other medications: omeprazole, Lovaza, and snacks also administered at 9 pm.  Past Medical History:  Diagnosis Date   Abnormal CT scan, kidney 10/06/2011   Acute pyelonephritis 10/07/2011   Anemia    normocytic   Anxiety    mental retardation   Bladder wall thickening 10/06/2011   BPH (benign prostatic hypertrophy)    Diabetes mellitus    Dialysis patient ([place])    Tuesday, Thursday and Saturday,    DVT of leg (deep venous thrombosis) ([place]) 12/25/2016   Edema     history of lower extremity edema   GERD (gastroesophageal reflux disease)    Heme positive stool    Hydronephrosis    Hyperkalemia    Hyperlipidemia    Hypernatremia    Hypertension    Hypothyroidism    Impaired speech    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[place])    MR (mental retardation)    Muscle weakness    Obstructive uropathy    Perinephric abscess 10/07/2011   Poor historian poor historian   Primary colorectal adenocarcinoma ([place]) 04/2017   S/p total colectomy   Protein calorie malnutrition ([place])    Pyelonephritis    Renal failure (ARF), acute on chronic ([place])    Renal insufficiency    chronic history   Sepsis ([place])    Smoking    Uremia    Urinary retention    UTI (lower urinary tract infection) 10/06/2011    Past Surgical History:  Procedure Laterality Date   A/V FISTULAGRAM N/A 08/13/2018   Procedure: A/V FISTULAGRAM - Right Upper;  Surgeon: Elam Dutch, MD;  Location: [place] [facility];  Service: Cardiovascular;  Laterality: N/A;   A/V FISTULAGRAM N/A 11/22/2018   Procedure: A/V FISTULAGRAM - Right Upper;  Surgeon: Waynetta Sandy, MD;  Location: [place] [facility];  Service: Cardiovascular;  Laterality: N/A;   AV FISTULA PLACEMENT Left 07/06/2015   Procedure:  INSERTION LEFT ARM ARTERIOVENOUS GORTEX GRAFT;  Surgeon: Angelia Mould, MD;  Location: [place];  Service: Vascular;  Laterality: Left;   AV FISTULA PLACEMENT Right 02/26/2016   Procedure: ARTERIOVENOUS (AV) FISTULA CREATION ;  Surgeon: Angelia Mould, MD;  Location: [place];  Service: Vascular;  Laterality: Right;   AV FISTULA PLACEMENT Right 11/25/2018   Procedure: INSERTION OF ARTERIOVENOUS (AV) ARTEGRAFT RIGHT UPPER ARM;  Surgeon: Waynetta Sandy, MD;  Location: [place];  Service: Vascular;  Laterality: Right;   AV FISTULA PLACEMENT Left 05/28/2021   Procedure: LEFT ARM ARTERIOVENOUS (AV) FISTULA;  Surgeon: Rosetta Posner, MD;  Location: [facility];  Service: Vascular;  Laterality: Left;   [place] REMOVAL Left 10/09/2015   Procedure: REMOVAL OF ARTERIOVENOUS GORETEX GRAFT ([place]) Evacuation of Lymphocele, Vein Patch angioplasty of brachial artery.;  Surgeon: Angelia Mould, MD;  Location: [place];  Service: Vascular;  Laterality: Left;   [place] Right 02/26/2016   Procedure: Right [place];  Surgeon: Angelia Mould, MD;  Location: [place];  Service: Vascular;  Laterality: Right;   [place] Left 07/30/2021   Procedure: LEFT ARM SECOND STAGE BASILIC VEIN TRANSPOSITION;  Surgeon: Donnetta Hutching,  Arvilla Meres, MD;  Location: [facility];  Service: Vascular;  Laterality: Left;   CIRCUMCISION N/A 01/04/2014   Procedure: CIRCUMCISION ADULT (procedure #1);  Surgeon: Marissa Nestle, MD;  Location: [facility];  Service: Urology;  Laterality: N/A;   COLECTOMY N/A 05/04/2017   Procedure: TOTAL COLECTOMY;  Surgeon: Aviva Signs, MD;  Location: [facility];  Service: General;  Laterality: N/A;   COLONOSCOPY N/A 04/27/2017   Surgeon: Daneil Dolin, MD; annular mass in the ascending colon likely representing  cancer biopsied, multiple 6-22 mm polyps removed, clean rectum.  Pathology with multiple tubular adenomas, high-grade dysplasia noted in ascending colon and splenic flexure.   CYSTOSCOPY W/ RETROGRADES Bilateral 06/29/2015   Procedure: CYSTOSCOPY, DILATION OF URETHRAL STRICTURE WITH BILATERAL RETROGRADE PYELOGRAM,SUPRAPUBIC TUBE CHANGE;  Surgeon: Festus Aloe, MD;  Location: [facility];  Service: Urology;  Laterality: Bilateral;   CYSTOSCOPY WITH URETHRAL DILATATION N/A 12/29/2013   Procedure: CYSTOSCOPY WITH URETHRAL DILATATION;  Surgeon: Marissa Nestle, MD;  Location: [facility];  Service: Urology;  Laterality: N/A;   ESOPHAGOGASTRODUODENOSCOPY N/A 04/27/2017   Procedure: ESOPHAGOGASTRODUODENOSCOPY (EGD);  Surgeon: Daneil Dolin, MD;  [facility];  Service: Endoscopy;  Laterality: N/A;   FLEXIBLE SIGMOIDOSCOPY N/A 12/02/2021   Procedure: FLEXIBLE SIGMOIDOSCOPY;  Surgeon: Eloise Harman, DO;  [facility];  Service: Endoscopy;  Laterality: N/A;  10:30am, dialysis patient   INSERTION OF DIALYSIS CATHETER Right 11/25/2018   Procedure: INSERTION OF DIALYSIS CATHETER RIGHT INTERNAL JUGULAR;  Surgeon: Waynetta Sandy, MD;  Location: [place];  Service: Vascular;  Laterality: Right;   IR AV DIALY SHUNT INTRO NEEDLE/INTRACATH INITIAL W/PTA/IMG RIGHT Right 09/07/2018   IR FLUORO GUIDE CV LINE RIGHT  10/16/2020   IR REMOVAL TUN CV CATH W/O FL  01/12/2019   IR THROMBECTOMY AV FISTULA W/THROMBOLYSIS/PTA INC/SHUNT/IMG RIGHT Right 04/26/2018   IR US GUIDE VASC ACCESS RIGHT  04/26/2018   IR US GUIDE VASC ACCESS RIGHT  09/07/2018   IR US GUIDE VASC ACCESS RIGHT  10/16/2020   IR US GUIDE VASC ACCESS RIGHT  10/16/2020   ORIF FEMUR FRACTURE Right 11/22/2016   Procedure: OPEN REDUCTION INTERNAL FIXATION (ORIF) DISTAL FEMUR FRACTURE;  Surgeon: Rod Can, MD;  Location: [place];  Service: Orthopedics;  Laterality: Right;   PATCH ANGIOPLASTY Right 12/10/2017   Procedure: PATCH  ANGIOPLASTY;  Surgeon: Angelia Mould, MD;  Location: [place];  Service: Vascular;  Laterality: Right;   PERIPHERAL VASCULAR BALLOON ANGIOPLASTY  08/13/2018   Procedure: PERIPHERAL VASCULAR BALLOON ANGIOPLASTY;  Surgeon: Elam Dutch, MD;  Location: [place] [facility];  Service: Cardiovascular;;  right AV fistula    PERIPHERAL VASCULAR BALLOON ANGIOPLASTY  11/22/2018   Procedure: PERIPHERAL VASCULAR BALLOON ANGIOPLASTY;  Surgeon: Waynetta Sandy, MD;  Location: [place] [facility];  Service: Cardiovascular;;  rt AV fistula   PERIPHERAL VASCULAR CATHETERIZATION N/A 10/08/2015   Procedure: A/V Shuntogram;  Surgeon: Angelia Mould, MD;  Location: [place] [facility];  Service: Cardiovascular;  Laterality: N/A;   REMOVAL OF A DIALYSIS CATHETER N/A 12/03/2021   Procedure: MINOR REMOVAL OF A TUNNELED DIALYSIS CATHETER;  Surgeon: Rosetta Posner, MD;  Location: [facility];  Service: Vascular;  Laterality: N/A;   THROMBECTOMY W/ EMBOLECTOMY Right 12/10/2017   Procedure: THROMBECTOMY REVISION RIGHT ARM  ARTERIOVENOUS FISTULA;  Surgeon: Angelia Mould, MD;  Location: [facility];  Service: Vascular;  Laterality: Right;   TRANSURETHRAL RESECTION OF PROSTATE N/A 01/04/2014   Procedure: TRANSURETHRAL RESECTION OF THE PROSTATE (  TURP) (procedure #2);  Surgeon: Marissa Nestle, MD;  Location: [facility];  Service: Urology;  Laterality: N/A;    Social History   Socioeconomic History   Marital status: Single    Spouse name: Not on file   Number of children: Not on file   Years of education: Not on file   Highest education level: Not on file  Occupational History   Occupation: retired   Tobacco Use   Smoking status: Never   Smokeless tobacco: Never  Vaping Use   Vaping Use: Never used  Substance and Sexual Activity   Alcohol use: No   Drug use: No   Sexual activity: Not Currently  Other Topics Concern   Not on file  Social History Narrative   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   [facility] resident of [facility]    Social Determinants of Health   Financial Resource Strain: Low Risk  (02/18/2018)   Overall Financial Resource Strain (CARDIA)    Difficulty of Paying Living Expenses: Not hard at all  Food Insecurity: No Food Insecurity (02/18/2018)   Hunger Vital Sign    Worried About Running Out of Food in the Last Year: Never true    Ran Out of Food in the Last Year: Never true  Transportation Needs: No Transportation Needs (02/18/2018)   PRAPARE - Hydrologist (Medical): No    Lack of Transportation (Non-Medical): No  Physical Activity: Unknown (09/15/2019)   Exercise Vital Sign    Days of Exercise per Week: Not on file    Minutes of Exercise per Session: 0 min  Stress: No Stress Concern Present (02/18/2018)   [place]    Feeling of Stress : Not at all  Social Connections: Unknown (09/15/2019)   Social Connection and Isolation Panel [NHANES]    Frequency of Communication with Friends and Family: Not on file    Frequency of Social Gatherings with Friends and Family: Never    Attends Religious Services: Not on Diplomatic Services operational officer of Clubs or Organizations: Not on file    Attends Archivist Meetings: Not on file    Marital Status: Not on file  Intimate Partner Violence: Not At Risk (02/18/2018)   Humiliation, Afraid, Rape, and Kick questionnaire    Fear of Current or Ex-Partner: No    Emotionally Abused: No    Physically Abused: No    Sexually Abused: No   Family History  Problem Relation Age of Onset   Cancer Mother    Colon cancer Neg Hx       VITAL SIGNS There were no vitals taken for this visit.  Outpatient Encounter Medications as of 09/29/2022  Medication Sig   aspirin EC 81 MG tablet Take 81 mg by mouth daily. 0900   atorvastatin (LIPITOR) 20 MG tablet Take 20 mg by mouth at bedtime. 2000    Insulin Glargine (BASAGLAR KWIKPEN) 100 UNIT/ML SOPN Inject 20 Units into the skin at bedtime. 2100 DO NOT HOLD this insulin without provider notification   Insulin Pen Needle (BD AUTOSHIELD DUO) 30G X 5 MM MISC by Does not apply route. 3/16"   levothyroxine (SYNTHROID) 125 MCG tablet Take 125 mcg by mouth at bedtime. 2100   midodrine (PROAMATINE) 10 MG tablet Take 10 mg by mouth See admin instructions. Send med with resident on  Tues/Thurs/Sat to dialysis. do not give at [facility], give to resident  to take with him. send with '5mg'$  to equal '15mg'$ . 6:00   midodrine (PROAMATINE) 5 MG tablet Take 5 mg by mouth See admin instructions. Send with resident on dialysis days.  Tues/Thurs/Sat  Take along with 10 mg to equal 15 mg 6:00   NON FORMULARY Diet: NAS, Cons CHO   omega-3 acid ethyl esters (LOVAZA) 1 g capsule Take 2 g by mouth at bedtime. 2100   omeprazole (PRILOSEC) 40 MG capsule Take 40 mg by mouth daily. 0900   polyethylene glycol (MIRALAX / GLYCOLAX) packet Take 17 g by mouth daily as needed for mild constipation.    sevelamer carbonate (RENVELA) 800 MG tablet Take 2,400 mg by mouth in the morning and at bedtime.   sevelamer carbonate (RENVELA) 800 MG tablet Take 2,400 mg by mouth daily.   tamsulosin (FLOMAX) 0.4 MG CAPS capsule Take 0.4 mg by mouth daily at 6 PM. Give 30 minutes after a meal. Do not crush or chew   torsemide (DEMADEX) 20 MG tablet Take 20 mg by mouth as directed. Sunday, Monday, Wednesday, and Friday   vitamin B-12 (CYANOCOBALAMIN) 1000 MCG tablet Take 1,000 mcg by mouth daily.   No facility-administered encounter medications on file as of 09/29/2022.     SIGNIFICANT DIAGNOSTIC EXAMS No exam   Visit Problem List with A/P  Hypothyroidism Established problem Uncontrolled, TSH elevated 5.86 from prior normal level in August   TSH elevation concerning for inadequate serum levothyroxine   There may be a contribution from concurrently administered meds and snacks with  patient's levothyroxine at 9 pm.  Plan: Change levothyroxine administration to 2 pm.           Recheck TSH in 4 to 6 weeks, if TSH still elevated, increased dose of levothyroxine may be needed.         ASSESSMENT/ PLAN:     Ok Edwards NP [facility]  Contact [phone] Monday through Friday 8am- 5pm  After hours call [phone]

## 2022-09-30 ENCOUNTER — Telehealth: Payer: Self-pay

## 2022-09-30 LAB — HEMOGLOBIN A1C
Hgb A1c MFr Bld: 5 % (ref 4.8–5.6)
Mean Plasma Glucose: 97 mg/dL

## 2022-09-30 NOTE — Telephone Encounter (Signed)
Betsy at [facility] called stating that the pt's fistula clotted during HD and he was unable to get treatment today. The provider gave an order for a surgical revision for the LUA AVF. She requested an appt with Dr. Donnetta Hutching asap.  Reviewed pt's chart, returned call for clarification, two identifiers used. Pt has no other access. Asked her to fax order and notes to this office and gave fax number.  Spoke with Herma Ard RN in surgery scheduling who advised for her to call [facility] so that pt could get [facility] for HD treatment since he would not be able to be added to [facility] surgical schedule for today. Called [place] and relayed information. Confirmed understanding.

## 2022-10-01 ENCOUNTER — Encounter: Payer: Self-pay | Admitting: Adult Health

## 2022-10-01 ENCOUNTER — Emergency Department (HOSPITAL_COMMUNITY): Payer: Medicare Other

## 2022-10-01 ENCOUNTER — Encounter (HOSPITAL_COMMUNITY): Payer: Self-pay

## 2022-10-01 ENCOUNTER — Inpatient Hospital Stay (HOSPITAL_COMMUNITY)
Admission: EM | Admit: 2022-10-01 | Discharge: 2022-10-03 | DRG: 314 | Disposition: A | Discharge status: Skilled Nursing Facility | Payer: Medicare Other | Source: Skilled Nursing Facility | Attending: Internal Medicine | Admitting: Internal Medicine

## 2022-10-01 ENCOUNTER — Non-Acute Institutional Stay (SKILLED_NURSING_FACILITY): Payer: Medicare Other | Admitting: Adult Health

## 2022-10-01 ENCOUNTER — Other Ambulatory Visit: Payer: Self-pay

## 2022-10-01 ENCOUNTER — Encounter (HOSPITAL_COMMUNITY): Payer: Self-pay | Admitting: *Deleted

## 2022-10-01 DIAGNOSIS — Z7401 Bed confinement status: Secondary | ICD-10-CM | POA: Diagnosis not present

## 2022-10-01 DIAGNOSIS — E1122 Type 2 diabetes mellitus with diabetic chronic kidney disease: Secondary | ICD-10-CM

## 2022-10-01 DIAGNOSIS — E875 Hyperkalemia: Secondary | ICD-10-CM | POA: Diagnosis present

## 2022-10-01 DIAGNOSIS — N401 Enlarged prostate with lower urinary tract symptoms: Secondary | ICD-10-CM | POA: Diagnosis present

## 2022-10-01 DIAGNOSIS — Z794 Long term (current) use of insulin: Secondary | ICD-10-CM

## 2022-10-01 DIAGNOSIS — E1169 Type 2 diabetes mellitus with other specified complication: Secondary | ICD-10-CM | POA: Diagnosis present

## 2022-10-01 DIAGNOSIS — N250 Renal osteodystrophy: Secondary | ICD-10-CM | POA: Diagnosis not present

## 2022-10-01 DIAGNOSIS — Z992 Dependence on renal dialysis: Secondary | ICD-10-CM | POA: Diagnosis not present

## 2022-10-01 DIAGNOSIS — Y832 Surgical operation with anastomosis, bypass or graft as the cause of abnormal reaction of the patient, or of later complication, without mention of misadventure at the time of the procedure: Secondary | ICD-10-CM | POA: Diagnosis present

## 2022-10-01 DIAGNOSIS — E7800 Pure hypercholesterolemia, unspecified: Secondary | ICD-10-CM | POA: Diagnosis present

## 2022-10-01 DIAGNOSIS — D696 Thrombocytopenia, unspecified: Secondary | ICD-10-CM | POA: Diagnosis present

## 2022-10-01 DIAGNOSIS — E8889 Other specified metabolic disorders: Secondary | ICD-10-CM | POA: Diagnosis present

## 2022-10-01 DIAGNOSIS — Z6831 Body mass index (BMI) 31.0-31.9, adult: Secondary | ICD-10-CM

## 2022-10-01 DIAGNOSIS — I120 Hypertensive chronic kidney disease with stage 5 chronic kidney disease or end stage renal disease: Secondary | ICD-10-CM | POA: Diagnosis present

## 2022-10-01 DIAGNOSIS — Z85048 Personal history of other malignant neoplasm of rectum, rectosigmoid junction, and anus: Secondary | ICD-10-CM | POA: Diagnosis not present

## 2022-10-01 DIAGNOSIS — N2581 Secondary hyperparathyroidism of renal origin: Secondary | ICD-10-CM | POA: Diagnosis present

## 2022-10-01 DIAGNOSIS — E669 Obesity, unspecified: Secondary | ICD-10-CM | POA: Diagnosis present

## 2022-10-01 DIAGNOSIS — Z91158 Patient's noncompliance with renal dialysis for other reason: Secondary | ICD-10-CM

## 2022-10-01 DIAGNOSIS — E11319 Type 2 diabetes mellitus with unspecified diabetic retinopathy without macular edema: Secondary | ICD-10-CM | POA: Diagnosis present

## 2022-10-01 DIAGNOSIS — E039 Hypothyroidism, unspecified: Secondary | ICD-10-CM | POA: Diagnosis present

## 2022-10-01 DIAGNOSIS — Z7982 Long term (current) use of aspirin: Secondary | ICD-10-CM

## 2022-10-01 DIAGNOSIS — F79 Unspecified intellectual disabilities: Secondary | ICD-10-CM

## 2022-10-01 DIAGNOSIS — I951 Orthostatic hypotension: Secondary | ICD-10-CM | POA: Diagnosis present

## 2022-10-01 DIAGNOSIS — N186 End stage renal disease: Secondary | ICD-10-CM

## 2022-10-01 DIAGNOSIS — D631 Anemia in chronic kidney disease: Secondary | ICD-10-CM | POA: Diagnosis present

## 2022-10-01 DIAGNOSIS — E8779 Other fluid overload: Secondary | ICD-10-CM | POA: Diagnosis not present

## 2022-10-01 DIAGNOSIS — Z86718 Personal history of other venous thrombosis and embolism: Secondary | ICD-10-CM | POA: Diagnosis not present

## 2022-10-01 DIAGNOSIS — K219 Gastro-esophageal reflux disease without esophagitis: Secondary | ICD-10-CM | POA: Diagnosis present

## 2022-10-01 DIAGNOSIS — R531 Weakness: Secondary | ICD-10-CM | POA: Diagnosis not present

## 2022-10-01 DIAGNOSIS — F419 Anxiety disorder, unspecified: Secondary | ICD-10-CM | POA: Diagnosis present

## 2022-10-01 DIAGNOSIS — I10 Essential (primary) hypertension: Secondary | ICD-10-CM

## 2022-10-01 DIAGNOSIS — T82590A Other mechanical complication of surgically created arteriovenous fistula, initial encounter: Secondary | ICD-10-CM | POA: Diagnosis not present

## 2022-10-01 DIAGNOSIS — R338 Other retention of urine: Secondary | ICD-10-CM | POA: Diagnosis present

## 2022-10-01 DIAGNOSIS — T82858A Stenosis of vascular prosthetic devices, implants and grafts, initial encounter: Secondary | ICD-10-CM | POA: Diagnosis present

## 2022-10-01 DIAGNOSIS — Z9049 Acquired absence of other specified parts of digestive tract: Secondary | ICD-10-CM

## 2022-10-01 DIAGNOSIS — Z79890 Hormone replacement therapy (postmenopausal): Secondary | ICD-10-CM

## 2022-10-01 DIAGNOSIS — Z4901 Encounter for fitting and adjustment of extracorporeal dialysis catheter: Secondary | ICD-10-CM | POA: Diagnosis not present

## 2022-10-01 DIAGNOSIS — J811 Chronic pulmonary edema: Secondary | ICD-10-CM | POA: Diagnosis not present

## 2022-10-01 DIAGNOSIS — Z9079 Acquired absence of other genital organ(s): Secondary | ICD-10-CM

## 2022-10-01 DIAGNOSIS — E785 Hyperlipidemia, unspecified: Secondary | ICD-10-CM | POA: Diagnosis present

## 2022-10-01 DIAGNOSIS — Z79899 Other long term (current) drug therapy: Secondary | ICD-10-CM

## 2022-10-01 DIAGNOSIS — R4182 Altered mental status, unspecified: Secondary | ICD-10-CM | POA: Diagnosis not present

## 2022-10-01 LAB — BASIC METABOLIC PANEL
Anion gap: 14 (ref 5–15)
BUN: 61 mg/dL — ABNORMAL HIGH (ref 8–23)
CO2: 22 mmol/L (ref 22–32)
Calcium: 9.3 mg/dL (ref 8.9–10.3)
Chloride: 103 mmol/L (ref 98–111)
Creatinine, Ser: 21.78 mg/dL — ABNORMAL HIGH (ref 0.61–1.24)
GFR, Estimated: 2 mL/min — ABNORMAL LOW (ref >60)
Glucose, Bld: 97 mg/dL (ref 70–99)
Potassium: 5.3 mmol/L — ABNORMAL HIGH (ref 3.5–5.1)
Sodium: 139 mmol/L (ref 135–145)

## 2022-10-01 LAB — CBC
HCT: 39 % (ref 39.0–52.0)
Hemoglobin: 12.5 g/dL — ABNORMAL LOW (ref 13.0–17.0)
MCH: 34.6 pg — ABNORMAL HIGH (ref 26.0–34.0)
MCHC: 32.1 g/dL (ref 30.0–36.0)
MCV: 108 fL — ABNORMAL HIGH (ref 80.0–100.0)
Platelets: 97 10*3/uL — ABNORMAL LOW (ref 150–400)
RBC: 3.61 MIL/uL — ABNORMAL LOW (ref 4.22–5.81)
RDW: 15.5 % (ref 11.5–15.5)
WBC: 4 10*3/uL (ref 4.0–10.5)
nRBC: 0 % (ref 0.0–0.2)

## 2022-10-01 LAB — CBG MONITORING, ED: Glucose-Capillary: 82 mg/dL (ref 70–99)

## 2022-10-01 MED ORDER — ACETAMINOPHEN 650 MG RE SUPP: 650.0000 mg | Freq: Four times a day (QID) | RECTAL | Status: DC | PRN

## 2022-10-01 MED ORDER — OMEGA-3-ACID ETHYL ESTERS 1 G PO CAPS
2.0000 g | ORAL_CAPSULE | Freq: Every day | ORAL | Status: DC
  Filled 2022-10-01 (×3): qty 2

## 2022-10-01 MED ORDER — PANTOPRAZOLE SODIUM 40 MG PO TBEC
40.0000 mg | DELAYED_RELEASE_TABLET | Freq: Every day | ORAL | Status: DC
  Administered 2022-10-01 – 2022-10-03 (×2): 40 mg via ORAL
  Filled 2022-10-01 (×2): qty 1

## 2022-10-01 MED ORDER — TORSEMIDE 20 MG PO TABS: 20.0000 mg | ORAL_TABLET | ORAL | Status: DC

## 2022-10-01 MED ORDER — INSULIN ASPART 100 UNIT/ML IJ SOLN: 0.0000 [IU] | Freq: Three times a day (TID) | INTRAMUSCULAR | Status: DC

## 2022-10-01 MED ORDER — ATORVASTATIN CALCIUM 10 MG PO TABS
20.0000 mg | ORAL_TABLET | Freq: Every day | ORAL | Status: DC
Start: 2022-10-01 — End: 2022-10-04
  Administered 2022-10-01 – 2022-10-02 (×2): 20 mg via ORAL
  Filled 2022-10-01 (×2): qty 2

## 2022-10-01 MED ORDER — SODIUM ZIRCONIUM CYCLOSILICATE 5 G PO PACK
10.0000 g | PACK | ORAL | Status: AC
  Administered 2022-10-01: 10 g via ORAL
  Filled 2022-10-01: qty 2

## 2022-10-01 MED ORDER — MIDODRINE HCL 5 MG PO TABS
15.0000 mg | ORAL_TABLET | ORAL | Status: DC
  Administered 2022-10-03: 15 mg via ORAL

## 2022-10-01 MED ORDER — ACETAMINOPHEN 325 MG PO TABS: 650.0000 mg | ORAL_TABLET | Freq: Four times a day (QID) | ORAL | Status: DC | PRN

## 2022-10-01 MED ORDER — INSULIN GLARGINE-YFGN 100 UNIT/ML ~~LOC~~ SOLN
8.0000 [IU] | Freq: Every day | SUBCUTANEOUS | Status: DC
  Administered 2022-10-02: 8 [IU] via SUBCUTANEOUS
  Filled 2022-10-01 (×3): qty 0.08

## 2022-10-01 MED ORDER — HEPARIN SODIUM (PORCINE) 5000 UNIT/ML IJ SOLN
5000.0000 [IU] | Freq: Three times a day (TID) | INTRAMUSCULAR | Status: DC
  Administered 2022-10-02 – 2022-10-03 (×2): 5000 [IU] via SUBCUTANEOUS
  Filled 2022-10-01 (×2): qty 1

## 2022-10-01 MED ORDER — BASAGLAR KWIKPEN 100 UNIT/ML ~~LOC~~ SOPN
8.0000 [IU] | PEN_INJECTOR | Freq: Every day | SUBCUTANEOUS | Status: DC
Start: 2022-10-01 — End: 2022-10-01

## 2022-10-01 MED ORDER — SEVELAMER CARBONATE 800 MG PO TABS
2400.0000 mg | ORAL_TABLET | Freq: Three times a day (TID) | ORAL | Status: DC
  Administered 2022-10-03: 2400 mg via ORAL
  Filled 2022-10-01 (×3): qty 3

## 2022-10-01 MED ORDER — TAMSULOSIN HCL 0.4 MG PO CAPS
0.4000 mg | ORAL_CAPSULE | Freq: Every day | ORAL | Status: DC
  Administered 2022-10-02 – 2022-10-03 (×2): 0.4 mg via ORAL
  Filled 2022-10-01 (×2): qty 1

## 2022-10-01 MED ORDER — LEVOTHYROXINE SODIUM 25 MCG PO TABS
125.0000 ug | ORAL_TABLET | Freq: Every day | ORAL | Status: DC
  Administered 2022-10-02 – 2022-10-03 (×2): 125 ug via ORAL
  Filled 2022-10-01 (×3): qty 1

## 2022-10-01 MED ORDER — POLYETHYLENE GLYCOL 3350 17 G PO PACK: 17.0000 g | PACK | Freq: Every day | ORAL | Status: DC | PRN

## 2022-10-01 MED ORDER — VITAMIN B-12 1000 MCG PO TABS
1000.0000 ug | ORAL_TABLET | Freq: Every day | ORAL | Status: DC
  Administered 2022-10-03: 1000 ug via ORAL
  Filled 2022-10-01: qty 1

## 2022-10-01 NOTE — ED Notes (Signed)
Pt had cell phone in hand when being transferred by [facility]- pt WC is here in ED- [facility] called and informed that they need to come and get pt WC as he has been transported to [facility]

## 2022-10-01 NOTE — ED Notes (Signed)
Pt bib [facility] from [facility] for vascular issue in AV fistula on left side. Pt has missed dialysis for approx 1 week - unsure when last session is. Alert, oriented, and denies pain on arrival.

## 2022-10-01 NOTE — ED Notes (Signed)
Spoke with pt brother, Delfino Lovett and informed that pt is going to [facility] for further treatment and evaluation.

## 2022-10-01 NOTE — Progress Notes (Unsigned)
Location:  [place] Room Number: Q412K Place of Service:  SNF (31)   CODE STATUS: Full Code  No Known Allergies  Chief Complaint  Patient presents with   Acute Visit    Diabetes.     HPI:  His hgb A1c is 5.0; he is presently taking basaglar 20 units nightly. He has had some cbg readings in the 80-90 range. There are no reports of hypoglycemic symptoms.   Past Medical History:  Diagnosis Date   Abnormal CT scan, kidney 10/06/2011   Acute pyelonephritis 10/07/2011   Anemia    normocytic   Anxiety    mental retardation   Bladder wall thickening 10/06/2011   BPH (benign prostatic hypertrophy)    Diabetes mellitus    Dialysis patient ([place])    Tuesday, Thursday and Saturday,    DVT of leg (deep venous thrombosis) ([place]) 12/25/2016   Edema     history of lower extremity edema   GERD (gastroesophageal reflux disease)    Heme positive stool    Hydronephrosis    Hyperkalemia    Hyperlipidemia    Hypernatremia    Hypertension    Hypothyroidism    Impaired speech    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[place])    MR (mental retardation)    Muscle weakness    Obstructive uropathy    Perinephric abscess 10/07/2011   Poor historian poor historian   Primary colorectal adenocarcinoma ([place]) 04/2017   S/p total colectomy   Protein calorie malnutrition ([place])    Pyelonephritis    Renal failure (ARF), acute on chronic (HCC)    Renal insufficiency    chronic history   Sepsis ([place])    Smoking    Uremia    Urinary retention    UTI (lower urinary tract infection) 10/06/2011    Past Surgical History:  Procedure Laterality Date   A/V FISTULAGRAM N/A 08/13/2018   Procedure: A/V FISTULAGRAM - Right Upper;  Surgeon: Elam Dutch, MD;  Location: [facility];  Service: Cardiovascular;  Laterality: N/A;   A/V FISTULAGRAM N/A 11/22/2018   Procedure: A/V FISTULAGRAM - Right Upper;  Surgeon: Waynetta Sandy, MD;  Location: [place] [facility];   Service: Cardiovascular;  Laterality: N/A;   AV FISTULA PLACEMENT Left 07/06/2015   Procedure:  INSERTION LEFT ARM ARTERIOVENOUS GORTEX GRAFT;  Surgeon: Angelia Mould, MD;  Location: [place];  Service: Vascular;  Laterality: Left;   AV FISTULA PLACEMENT Right 02/26/2016   Procedure: ARTERIOVENOUS (AV) FISTULA CREATION ;  Surgeon: Angelia Mould, MD;  Location: [place];  Service: Vascular;  Laterality: Right;   AV FISTULA PLACEMENT Right 11/25/2018   Procedure: INSERTION OF ARTERIOVENOUS (AV) ARTEGRAFT RIGHT UPPER ARM;  Surgeon: Waynetta Sandy, MD;  Location: [place];  Service: Vascular;  Laterality: Right;   AV FISTULA PLACEMENT Left 05/28/2021   Procedure: LEFT ARM ARTERIOVENOUS (AV) FISTULA;  Surgeon: Rosetta Posner, MD;  Location: [facility];  Service: Vascular;  Laterality: Left;   [place] REMOVAL Left 10/09/2015   Procedure: REMOVAL OF ARTERIOVENOUS GORETEX GRAFT ([place]) Evacuation of Lymphocele, Vein Patch angioplasty of brachial artery.;  Surgeon: Angelia Mould, MD;  Location: [place];  Service: Vascular;  Laterality: Left;   [employer] Right 02/26/2016   Procedure: Right [place];  Surgeon: Angelia Mould, MD;  Location: [place];  Service: Vascular;  Laterality: Right;   [place] Left 07/30/2021   Procedure: LEFT ARM SECOND STAGE BASILIC VEIN  TRANSPOSITION;  Surgeon: Rosetta Posner, MD;  Location: [facility];  Service: Vascular;  Laterality: Left;   CIRCUMCISION N/A 01/04/2014   Procedure: CIRCUMCISION ADULT (procedure #1);  Surgeon: Marissa Nestle, MD;  Location[facility];  Service: Urology;  Laterality: N/A;   COLECTOMY N/A 05/04/2017   Procedure: TOTAL COLECTOMY;  Surgeon: Aviva Signs, MD;  Location: [facility];  Service: General;  Laterality: N/A;   COLONOSCOPY N/A 04/27/2017   Surgeon: Daneil Dolin, MD; annular mass in the ascending colon likely representing cancer biopsied, multiple 6-22 mm polyps removed,  clean rectum.  Pathology with multiple tubular adenomas, high-grade dysplasia noted in ascending colon and splenic flexure.   CYSTOSCOPY W/ RETROGRADES Bilateral 06/29/2015   Procedure: CYSTOSCOPY, DILATION OF URETHRAL STRICTURE WITH BILATERAL RETROGRADE PYELOGRAM,SUPRAPUBIC TUBE CHANGE;  Surgeon: Festus Aloe, MD;  Location[facility];  Service: Urology;  Laterality: Bilateral;   CYSTOSCOPY WITH URETHRAL DILATATION N/A 12/29/2013   Procedure: CYSTOSCOPY WITH URETHRAL DILATATION;  Surgeon: Marissa Nestle, MD;  [facility];  Service: Urology;  Laterality: N/A;   ESOPHAGOGASTRODUODENOSCOPY N/A 04/27/2017   Procedure: ESOPHAGOGASTRODUODENOSCOPY (EGD);  Surgeon: Daneil Dolin, MD;  [facility];  Service: Endoscopy;  Laterality: N/A;   FLEXIBLE SIGMOIDOSCOPY N/A 12/02/2021   Procedure: FLEXIBLE SIGMOIDOSCOPY;  Surgeon: Eloise Harman, DO;  [facility];  Service: Endoscopy;  Laterality: N/A;  10:30am, dialysis patient   INSERTION OF DIALYSIS CATHETER Right 11/25/2018   Procedure: INSERTION OF DIALYSIS CATHETER RIGHT INTERNAL JUGULAR;  Surgeon: Waynetta Sandy, MD;  Location: [place];  Service: Vascular;  Laterality: Right;   IR AV DIALY SHUNT INTRO NEEDLE/INTRACATH INITIAL W/PTA/IMG RIGHT Right 09/07/2018   IR FLUORO GUIDE CV LINE RIGHT  10/16/2020   IR REMOVAL TUN CV CATH W/O FL  01/12/2019   IR THROMBECTOMY AV FISTULA W/THROMBOLYSIS/PTA INC/SHUNT/IMG RIGHT Right 04/26/2018   IR US GUIDE VASC ACCESS RIGHT  04/26/2018   IR US GUIDE VASC ACCESS RIGHT  09/07/2018   IR US GUIDE VASC ACCESS RIGHT  10/16/2020   IR US GUIDE VASC ACCESS RIGHT  10/16/2020   ORIF FEMUR FRACTURE Right 11/22/2016   Procedure: OPEN REDUCTION INTERNAL FIXATION (ORIF) DISTAL FEMUR FRACTURE;  Surgeon: Rod Can, MD;  Location: [place];  Service: Orthopedics;  Laterality: Right;   PATCH ANGIOPLASTY Right 12/10/2017   Procedure: PATCH ANGIOPLASTY;  Surgeon: Angelia Mould, MD;   Location: [place];  Service: Vascular;  Laterality: Right;   PERIPHERAL VASCULAR BALLOON ANGIOPLASTY  08/13/2018   Procedure: PERIPHERAL VASCULAR BALLOON ANGIOPLASTY;  Surgeon: Elam Dutch, MD;  Location: [facility];  Service: Cardiovascular;;  right AV fistula    PERIPHERAL VASCULAR BALLOON ANGIOPLASTY  11/22/2018   Procedure: PERIPHERAL VASCULAR BALLOON ANGIOPLASTY;  Surgeon: Waynetta Sandy, MD;  Location: [facility] [facility];  Service: Cardiovascular;;  rt AV fistula   PERIPHERAL VASCULAR CATHETERIZATION N/A 10/08/2015   Procedure: A/V Shuntogram;  Surgeon: Angelia Mould, MD;  Location: [facility];  Service: Cardiovascular;  Laterality: N/A;   REMOVAL OF A DIALYSIS CATHETER N/A 12/03/2021   Procedure: MINOR REMOVAL OF A TUNNELED DIALYSIS CATHETER;  Surgeon: Rosetta Posner, MD;  [facility];  Service: Vascular;  Laterality: N/A;   THROMBECTOMY W/ EMBOLECTOMY Right 12/10/2017   Procedure: THROMBECTOMY REVISION RIGHT ARM  ARTERIOVENOUS FISTULA;  Surgeon: Angelia Mould, MD;  Location: [place];  Service: Vascular;  Laterality: Right;   TRANSURETHRAL RESECTION OF PROSTATE N/A 01/04/2014   Procedure: TRANSURETHRAL  RESECTION OF THE PROSTATE (TURP) (procedure #2);  Surgeon: Marissa Nestle, MD;  [facility];  Service: Urology;  Laterality: N/A;    Social History   Socioeconomic History   Marital status: Single    Spouse name: Not on file   Number of children: Not on file   Years of education: Not on file   Highest education level: Not on file  Occupational History   Occupation: retired   Tobacco Use   Smoking status: Never   Smokeless tobacco: Never  Vaping Use   Vaping Use: Never used  Substance and Sexual Activity   Alcohol use: No   Drug use: No   Sexual activity: Not Currently  Other Topics Concern   Not on file  Social History Narrative   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   [facility] term  resident [facility]    Social Determinants of Health   Financial Resource Strain: Low Risk  (02/18/2018)   Overall Financial Resource Strain (CARDIA)    Difficulty of Paying Living Expenses: Not hard at all  Food Insecurity: No Food Insecurity (02/18/2018)   Hunger Vital Sign    Worried About Running Out of Food in the Last Year: Never true    Ran Out of Food in the Last Year: Never true  Transportation Needs: No Transportation Needs (02/18/2018)   PRAPARE - Hydrologist (Medical): No    Lack of Transportation (Non-Medical): No  Physical Activity: Unknown (09/15/2019)   Exercise Vital Sign    Days of Exercise per Week: Not on file    Minutes of Exercise per Session: 0 min  Stress: No Stress Concern Present (02/18/2018)   [place]    Feeling of Stress : Not at all  Social Connections: Unknown (09/15/2019)   Social Connection and Isolation Panel [NHANES]    Frequency of Communication with Friends and Family: Not on file    Frequency of Social Gatherings with Friends and Family: Never    Attends Religious Services: Not on file    Active Member of Clubs or Organizations: Not on file    Attends Archivist Meetings: Not on file    Marital Status: Not on file  Intimate Partner Violence: Not At Risk (02/18/2018)   Humiliation, Afraid, Rape, and Kick questionnaire    Fear of Current or Ex-Partner: No    Emotionally Abused: No    Physically Abused: No    Sexually Abused: No   Family History  Problem Relation Age of Onset   Cancer Mother    Colon cancer Neg Hx       VITAL SIGNS BP (!) 140/68   Pulse 74   Temp 97.6 F (36.4 C)   Resp 18   Ht '5\' 8"'$  (1.727 m)   Wt 205 lb 12.8 oz (93.4 kg)   SpO2 100%   BMI 31.29 kg/m   Outpatient Encounter Medications as of 10/01/2022  Medication Sig   aspirin EC 81 MG tablet Take 81 mg by mouth daily. 0900   atorvastatin (LIPITOR) 20 MG  tablet Take 20 mg by mouth at bedtime. 2000   Insulin Glargine (BASAGLAR KWIKPEN) 100 UNIT/ML SOPN Inject 15 Units into the skin at bedtime. 2100 DO NOT HOLD this insulin without provider notification   Insulin Pen Needle (BD AUTOSHIELD DUO) 30G X 5 MM MISC by Does not apply route. 3/16"   levothyroxine (SYNTHROID) 125 MCG tablet Take  125 mcg by mouth at bedtime. 2100   midodrine (PROAMATINE) 10 MG tablet Take 10 mg by mouth See admin instructions. Send med with resident on  Tues/Thurs/Sat to dialysis. do not give [facility], give to resident to take with him. send with '5mg'$  to equal '15mg'$ . 6:00   midodrine (PROAMATINE) 5 MG tablet Take 5 mg by mouth See admin instructions. Send with resident on dialysis days.  Tues/Thurs/Sat  Take along with 10 mg to equal 15 mg 6:00   NON FORMULARY Diet: NAS, Cons CHO   omega-3 acid ethyl esters (LOVAZA) 1 g capsule Take 2 g by mouth at bedtime. 2100   omeprazole (PRILOSEC) 40 MG capsule Take 40 mg by mouth daily. 0900   polyethylene glycol (MIRALAX / GLYCOLAX) packet Take 17 g by mouth daily as needed for mild constipation.    sevelamer carbonate (RENVELA) 800 MG tablet Take 2,400 mg by mouth in the morning and at bedtime.   sevelamer carbonate (RENVELA) 800 MG tablet Take 2,400 mg by mouth daily.   tamsulosin (FLOMAX) 0.4 MG CAPS capsule Take 0.4 mg by mouth daily at 6 PM. Give 30 minutes after a meal. Do not crush or chew   torsemide (DEMADEX) 20 MG tablet Take 20 mg by mouth as directed. Sunday, Monday, Wednesday, and Friday   vitamin B-12 (CYANOCOBALAMIN) 1000 MCG tablet Take 1,000 mcg by mouth daily.   No facility-administered encounter medications on file as of 10/01/2022.     SIGNIFICANT DIAGNOSTIC EXAMS  PREVIOUS   12-30-21: ct of abdomen and pelvis:  1. No significant change in size or appearance of the hypodense lesion in the head of the pancreas, possible cyst or IPMN. Recommend continued follow-up with contrast-enhanced MRI or CT in 2  years. 2. Cholelithiasis. 3. Left renal calculus. Bilateral renal mild atrophy with multiple renal cysts. 4. Other chronic findings as described.  06-11-22: dexa: t score: -2.052  NO NEW EXAMS.    LABS REVIEWED PREVIOUS;    11-07-21: wbc 4,5 hgb 9.5; hct 29.5; mcv 103.9 plt 107; glucose 129; bun 28; creat 9.01; k+ 4.5; na++ 138; ca 8.6; GFR 6; liver normal albumin 3.1; hgb a1c 5.8; chol 60 ldl 18; trig 71; hdl 28; tsh 1.538  11-22-21: hgb 9,3; hct 28.4; iron 80; tibc 203; ferritin 474; vitamin B 12: 272 folate 6.6  12-02-21: hgb 10.5; hct 31.0; glucose 85; bun 25; creat 9.80; k+ 3.9; na++ 140;   01-28-22; wbc 4.8; hgb 8.7; hct 26.0; mcv 105.3; plt 113; glucose 1; bun 40; creat 11.35; k+ 3.8; na++ 141; ca 8.8; GFR 4 d-dimer 0.43; CRP <0.5  06-19-22: hgb a1c 5.8; tsh 2.539 free t4: 0.98  TODAY  09-29-22: wbc 4.3; hgb 12.9; hct 41.1; mcv 108.4 plt 108; glucose 112; bun 44; creat 16.87; k+ 4.7; na++ 139; ca 9.3; gfr 3; protein 7.0; albumin 3.5 hgb A1c 5.0; tsh 5.861; chol 89; ldl 41; trig 63; hdl 35  Review of Systems  Constitutional:  Negative for malaise/fatigue.  Respiratory:  Negative for cough and shortness of breath.   Cardiovascular:  Negative for chest pain, palpitations and leg swelling.  Gastrointestinal:  Negative for abdominal pain, constipation and heartburn.  Musculoskeletal:  Negative for back pain, joint pain and myalgias.  Skin: Negative.   Neurological:  Negative for dizziness.  Psychiatric/Behavioral:  The patient is not nervous/anxious.      Physical Exam Constitutional:      General: He is not in acute distress.    Appearance: He is  well-developed. He is not diaphoretic.  Neck:     Thyroid: No thyromegaly.  Cardiovascular:     Rate and Rhythm: Normal rate and regular rhythm.     Pulses: Normal pulses.     Heart sounds: Normal heart sounds.  Pulmonary:     Effort: Pulmonary effort is normal. No respiratory distress.     Breath sounds: Normal breath sounds.   Abdominal:     General: Bowel sounds are normal. There is no distension.     Palpations: Abdomen is soft.     Tenderness: There is no abdominal tenderness.  Musculoskeletal:        General: Normal range of motion.     Cervical back: Neck supple.     Right lower leg: No edema.     Left lower leg: No edema.  Lymphadenopathy:     Cervical: No cervical adenopathy.  Skin:    General: Skin is warm and dry.     Comments: Left upper arm fistula   Neurological:     Mental Status: He is alert. Mental status is at baseline.  Psychiatric:        Mood and Affect: Mood normal.       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  call [phone]

## 2022-10-01 NOTE — Telephone Encounter (Signed)
Received a call from [facility] at [facility] stating that patient is getting his CVC placed on this afternoon and requests to get patient scheduled for revision of LUA AVG with Dr. Donnetta Hutching asap. Leodis Rains that she would need to fax the referral request to our office and then once referral processed, scheduling will contact the [facility] to get patient scheduled for a office visit with Dr. Donnetta Hutching for evaluation for surgery. Advised patient has to be evaluated first prior to scheduling surgery. She verbalized understanding.

## 2022-10-01 NOTE — ED Notes (Signed)
Nephrology paged at this time

## 2022-10-01 NOTE — H&P (Signed)
TRH H&P   Patient Demographics:    Zachary [name], is a 76 y.o. male  MRN: [id number]   DOB - 07-08-1946  Admit Date - 10/01/2022  Outpatient Primary MD for the patient is Zachary Fee, NP  Referring MD/NP/PA: Dr Sabra Heck   [facility]  Chief Complaint  Patient presents with   Vascular Access Problem      HPI:    Zachary [name]  is a 76 y.o. male, with past medical history of diabetes mellitus, insulin-dependent, end-stage renal disease on hemodialysis via left upper extremity fistula, hyperlipidemia, patient with intellectual disability, he himself denies any complaints, but he was sent by his facility today to malfunctioning vascular access, it has been received from facility patient has not been dialyzed for 5 to 7 days, but patient report he was dialyzed on Saturday, unclear how long he stayed exactly the dialysis, by reviewing records, it appears he just met hemodialysis yesterday. -In ED his workup significant for potassium of 5.3, creatinine of 21.78, BUN of 61, platelet count of 97, hemoglobin of 12.5, he was in no respiratory distress, has no leg edema, ED physician discussed with renal Dr. Hollie Salk who requested admission to [facility] where he can have declotting by IR in a.m. where he can receive dialysis after that, [facility] hospital[facility] consulted to admit.    Review of systems:     A full 10 point Review of Systems was done, except as stated above, all other Review of Systems were negative.   With Past History of the following :    Past Medical History:  Diagnosis Date   Abnormal CT scan, kidney 10/06/2011   Acute pyelonephritis 10/07/2011   Anemia    normocytic   Anxiety    mental retardation   Bladder wall thickening 10/06/2011   BPH (benign prostatic hypertrophy)    Diabetes mellitus    Dialysis patient ([place])    Tuesday, Thursday and  Saturday,    DVT of leg (deep venous thrombosis) ([place]) 12/25/2016   Edema     history of lower extremity edema   GERD (gastroesophageal reflux disease)    Heme positive stool    Hydronephrosis    Hyperkalemia    Hyperlipidemia    Hypernatremia    Hypertension    Hypothyroidism    Impaired speech    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HCC)    MR (mental retardation)    Muscle weakness    Obstructive uropathy    Perinephric abscess 10/07/2011   Poor historian poor historian   Primary colorectal adenocarcinoma ([place]) 04/2017   S/p total colectomy   Protein calorie malnutrition ([place])    Pyelonephritis    Renal failure (ARF), acute on chronic (HCC)    Renal insufficiency    chronic history   Sepsis ([place])    Smoking    Uremia    Urinary retention  UTI (lower urinary tract infection) 10/06/2011      Past Surgical History:  Procedure Laterality Date   A/V FISTULAGRAM N/A 08/13/2018   Procedure: A/V FISTULAGRAM - Right Upper;  Surgeon: Elam Dutch, MD;  Location: [place] [facility];  Service: Cardiovascular;  Laterality: N/A;   A/V FISTULAGRAM N/A 11/22/2018   Procedure: A/V FISTULAGRAM - Right Upper;  Surgeon: Waynetta Sandy, MD;  Location: [place] [facility];  Service: Cardiovascular;  Laterality: N/A;   AV FISTULA PLACEMENT Left 07/06/2015   Procedure:  INSERTION LEFT ARM ARTERIOVENOUS GORTEX GRAFT;  Surgeon: Angelia Mould, MD;  Location: [place];  Service: Vascular;  Laterality: Left;   AV FISTULA PLACEMENT Right 02/26/2016   Procedure: ARTERIOVENOUS (AV) FISTULA CREATION ;  Surgeon: Angelia Mould, MD;  Location: [place];  Service: Vascular;  Laterality: Right;   AV FISTULA PLACEMENT Right 11/25/2018   Procedure: INSERTION OF ARTERIOVENOUS (AV) ARTEGRAFT RIGHT UPPER ARM;  Surgeon: Waynetta Sandy, MD;  Location: [place];  Service: Vascular;  Laterality: Right;   AV FISTULA PLACEMENT Left 05/28/2021   Procedure: LEFT ARM ARTERIOVENOUS (AV)  FISTULA;  Surgeon: Rosetta Posner, MD;  [facility];  Service: Vascular;  Laterality: Left;   [place] REMOVAL Left 10/09/2015   Procedure: REMOVAL OF ARTERIOVENOUS GORETEX GRAFT ([place]) Evacuation of Lymphocele, Vein Patch angioplasty of brachial artery.;  Surgeon: Angelia Mould, MD;  Location: [place];  Service: Vascular;  Laterality: Left;   [place] Right 02/26/2016   Procedure: Right [place];  Surgeon: Angelia Mould, MD;  Location: [place];  Service: Vascular;  Laterality: Right;   [place] Left 07/30/2021   Procedure: LEFT ARM SECOND STAGE BASILIC VEIN TRANSPOSITION;  Surgeon: Rosetta Posner, MD;  Location: [facility];  Service: Vascular;  Laterality: Left;   CIRCUMCISION N/A 01/04/2014   Procedure: CIRCUMCISION ADULT (procedure #1);  Surgeon: Marissa Nestle, MD;  [facility]: [facility];  Service: Urology;  Laterality: N/A;   COLECTOMY N/A 05/04/2017   Procedure: TOTAL COLECTOMY;  Surgeon: Aviva Signs, MD;  Location: [facility];  Service: General;  Laterality: N/A;   COLONOSCOPY N/A 04/27/2017   Surgeon: Daneil Dolin, MD; annular mass in the ascending colon likely representing cancer biopsied, multiple 6-22 mm polyps removed, clean rectum.  Pathology with multiple tubular adenomas, high-grade dysplasia noted in ascending colon and splenic flexure.   CYSTOSCOPY W/ RETROGRADES Bilateral 06/29/2015   Procedure: CYSTOSCOPY, DILATION OF URETHRAL STRICTURE WITH BILATERAL RETROGRADE PYELOGRAM,SUPRAPUBIC TUBE CHANGE;  Surgeon: Festus Aloe, MD;  Location: [facility];  Service: Urology;  Laterality: Bilateral;   CYSTOSCOPY WITH URETHRAL DILATATION N/A 12/29/2013   Procedure: CYSTOSCOPY WITH URETHRAL DILATATION;  Surgeon: Marissa Nestle, MD;  [facility];  Service: Urology;  Laterality: N/A;   ESOPHAGOGASTRODUODENOSCOPY N/A 04/27/2017   Procedure: ESOPHAGOGASTRODUODENOSCOPY (EGD);  Surgeon: Daneil Dolin, MD;  [facility];  Service: Endoscopy;  Laterality: N/A;   FLEXIBLE SIGMOIDOSCOPY N/A 12/02/2021   Procedure: FLEXIBLE SIGMOIDOSCOPY;  Surgeon: Eloise Harman, DO;  [facility];  Service: Endoscopy;  Laterality: N/A;  10:30am, dialysis patient   INSERTION OF DIALYSIS CATHETER Right 11/25/2018   Procedure: INSERTION OF DIALYSIS CATHETER RIGHT INTERNAL JUGULAR;  Surgeon: Waynetta Sandy, MD;  Location: [place];  Service: Vascular;  Laterality: Right;   IR AV DIALY SHUNT INTRO NEEDLE/INTRACATH INITIAL W/PTA/IMG RIGHT Right 09/07/2018   IR FLUORO GUIDE CV LINE RIGHT  10/16/2020   IR REMOVAL  TUN CV CATH W/O FL  01/12/2019   IR THROMBECTOMY AV FISTULA W/THROMBOLYSIS/PTA INC/SHUNT/IMG RIGHT Right 04/26/2018   IR US GUIDE VASC ACCESS RIGHT  04/26/2018   IR US GUIDE VASC ACCESS RIGHT  09/07/2018   IR US GUIDE VASC ACCESS RIGHT  10/16/2020   IR US GUIDE VASC ACCESS RIGHT  10/16/2020   ORIF FEMUR FRACTURE Right 11/22/2016   Procedure: OPEN REDUCTION INTERNAL FIXATION (ORIF) DISTAL FEMUR FRACTURE;  Surgeon: Rod Can, MD;  Location: [place];  Service: Orthopedics;  Laterality: Right;   PATCH ANGIOPLASTY Right 12/10/2017   Procedure: PATCH ANGIOPLASTY;  Surgeon: Angelia Mould, MD;  Location: [place];  Service: Vascular;  Laterality: Right;   PERIPHERAL VASCULAR BALLOON ANGIOPLASTY  08/13/2018   Procedure: PERIPHERAL VASCULAR BALLOON ANGIOPLASTY;  Surgeon: Elam Dutch, MD;  Location: [place] [facility];  Service: Cardiovascular;;  right AV fistula    PERIPHERAL VASCULAR BALLOON ANGIOPLASTY  11/22/2018   Procedure: PERIPHERAL VASCULAR BALLOON ANGIOPLASTY;  Surgeon: Waynetta Sandy, MD;  Location: [place] [facility];  Service: Cardiovascular;;  rt AV fistula   PERIPHERAL VASCULAR CATHETERIZATION N/A 10/08/2015   Procedure: A/V Shuntogram;  Surgeon: Angelia Mould, MD;  Location: [place] [facility];  Service: Cardiovascular;  Laterality: N/A;   REMOVAL OF A DIALYSIS  CATHETER N/A 12/03/2021   Procedure: MINOR REMOVAL OF A TUNNELED DIALYSIS CATHETER;  Surgeon: Rosetta Posner, MD;  [facility];  Service: Vascular;  Laterality: N/A;   THROMBECTOMY W/ EMBOLECTOMY Right 12/10/2017   Procedure: THROMBECTOMY REVISION RIGHT ARM  ARTERIOVENOUS FISTULA;  Surgeon: Angelia Mould, MD;  Location: [facility];  Service: Vascular;  Laterality: Right;   TRANSURETHRAL RESECTION OF PROSTATE N/A 01/04/2014   Procedure: TRANSURETHRAL RESECTION OF THE PROSTATE (TURP) (procedure #2);  Surgeon: Marissa Nestle, MD;  [facility];  Service: Urology;  Laterality: N/A;      Social History:     Social History   Tobacco Use   Smoking status: Never   Smokeless tobacco: Never  Substance Use Topics   Alcohol use: No        Family History :     Family History  Problem Relation Age of Onset   Cancer Mother    Colon cancer Neg Hx       Home Medications:   Prior to Admission medications   Medication Sig Start Date End Date Taking? Authorizing Provider  aspirin EC 81 MG tablet Take 81 mg by mouth daily. 0900   Yes [provider]  atorvastatin (LIPITOR) 20 MG tablet Take 20 mg by mouth at bedtime. 2000   Yes [provider]  Insulin Glargine (BASAGLAR KWIKPEN) 100 UNIT/ML SOPN Inject 15 Units into the skin at bedtime. 2100 DO NOT HOLD this insulin without provider notification 07/17/20  Yes [provider]  levothyroxine (SYNTHROID) 125 MCG tablet Take 125 mcg by mouth at bedtime. 2100   Yes [provider]  midodrine (PROAMATINE) 10 MG tablet Take 10 mg by mouth See admin instructions. Send med with resident on  Tues/Thurs/Sat to dialysis. [facility] not [facility], give to resident to take with him. send with 5m to equal 159m 6:00 06/01/19  Yes [provider]  midodrine (PROAMATINE) 5 MG tablet Take 5 mg by mouth See admin instructions. Send with resident on dialysis days.  Tues/Thurs/Sat  Take along with 10 mg to  equal 15 mg 6:00 06/01/19  Yes [provider]  omega-3 acid ethyl esters (LOVAZA)  1 g capsule Take 2 g by mouth at bedtime. 2100 06/03/19  Yes [provider]  omeprazole (PRILOSEC) 40 MG capsule Take 40 mg by mouth daily. 0900 01/23/20  Yes [provider]  polyethylene glycol (MIRALAX / GLYCOLAX) packet Take 17 g by mouth daily as needed for mild constipation.  05/27/20  Yes [provider]  sevelamer carbonate (RENVELA) 800 MG tablet Take 2,400 mg by mouth in the morning and at bedtime. 07/11/20  Yes [provider]  sevelamer carbonate (RENVELA) 800 MG tablet Take 2,400 mg by mouth daily.   Yes [provider]  tamsulosin (FLOMAX) 0.4 MG CAPS capsule Take 0.4 mg by mouth daily at 6 PM. Give 30 minutes after a meal. Do not crush or chew   Yes [provider]  torsemide (DEMADEX) 20 MG tablet Take 20 mg by mouth as directed. Sunday, Monday, Wednesday, and Friday   Yes [provider]  vitamin B-12 (CYANOCOBALAMIN) 1000 MCG tablet Take 1,000 mcg by mouth daily.   Yes [provider]  Insulin Pen Needle (BD AUTOSHIELD DUO) 30G X 5 MM MISC by Does not apply route. 3/16"    [provider]  NON FORMULARY Diet: NAS, Cons CHO    [provider]     Allergies:    No Known Allergies   Physical Exam:   Vitals  Blood pressure (!) 166/96, pulse 70, temperature 98.6 F (37 C), temperature source Oral, resp. rate 18, height _0  (1.727 m), weight 93.3 kg, SpO2 100 %.   1. General developed, elderly male, laying in bed, in no apparent distress  2.  Patient  with normal affect, awake, oriented x 2, appears to be with impaired cognition and insight.  3. No F.N deficits, ALL C.Nerves Intact, Strength 5/5 all 4 extremities, Sensation intact all 4 extremities, Plantars down going.  4. Ears and Eyes appear Normal, Conjunctivae clear, PERRLA. Moist Oral Mucosa.  5. Supple Neck, No JVD, No cervical  lymphadenopathy appriciated, No Carotid Bruits.  6. Symmetrical Chest wall movement, Good air movement bilaterally, CTAB.  7. RRR, No Gallops, Rubs or Murmurs, No Parasternal Heave.  8. Positive Bowel Sounds, Abdomen Soft, No tenderness, No organomegaly appriciated,No rebound -guarding or rigidity.  9.  No Cyanosis, Normal Skin Turgor, No Skin Rash or Bruise.  10. Good muscle tone,  joints appear normal , no effusions, Normal ROM.  Upper extremity AV fistula present, could not appreciate any thrills, no erythema or discharge noted.   Data Review:    CBC Recent Labs  Lab 09/29/22 0818 10/01/22 1611  WBC 4.3 4.0  HGB 12.9* 12.5*  HCT 41.1 39.0  PLT 108* 97*  MCV 108.4* 108.0*  MCH 34.0 34.6*  MCHC 31.4 32.1  RDW 15.9* 15.5   ------------------------------------------------------------------------------------------------------------------  Chemistries  Recent Labs  Lab 09/29/22 0818 10/01/22 1611  NA 139 139  K 4.7 5.3*  CL 106 103  CO2 21* 22  GLUCOSE 112* 97  BUN 44* 61*  CREATININE 16.87* 21.78*  CALCIUM 9.3 9.3  AST 14*  --   ALT 12  --   ALKPHOS 54  --   BILITOT 0.6  --    ------------------------------------------------------------------------------------------------------------------ estimated creatinine clearance is 3.3 mL/min (A) (by C-G formula based on SCr of 21.78 mg/dL (H)). ------------------------------------------------------------------------------------------------------------------ Recent Labs    09/29/22 0640  TSH 5.861*    Coagulation profile No results for input(s): "INR", "PROTIME" in the last 168 hours. ------------------------------------------------------------------------------------------------------------------- No results for input(s): "DDIMER" in the  last 72 hours. -------------------------------------------------------------------------------------------------------------------  Cardiac Enzymes No results for input(s):  "CKMB", "TROPONINI", "MYOGLOBIN" in the last 168 hours.  Invalid input(s): "CK" ------------------------------------------------------------------------------------------------------------------ No results found for: "BNP"   ---------------------------------------------------------------------------------------------------------------  Urinalysis    Component Value Date/Time   COLORURINE AMBER (A) 06/12/2017 0615   APPEARANCEUR HAZY (A) 06/12/2017 0615   LABSPEC 1.021 06/12/2017 0615   PHURINE 5.0 06/12/2017 0615   GLUCOSEU NEGATIVE 06/12/2017 0615   HGBUR NEGATIVE 06/12/2017 0615   BILIRUBINUR NEGATIVE 06/12/2017 0615   KETONESUR NEGATIVE 06/12/2017 0615   PROTEINUR 30 (A) 06/12/2017 0615   UROBILINOGEN 0.2 07/20/2015 1850   NITRITE NEGATIVE 06/12/2017 0615   LEUKOCYTESUR NEGATIVE 06/12/2017 0615    ----------------------------------------------------------------------------------------------------------------   Imaging Results:    DG Chest Port 1 View  Result Date: 10/01/2022 CLINICAL DATA:  Missed dialysis. EXAM: PORTABLE CHEST 1 VIEW COMPARISON:  CXR 09/16/19 FINDINGS: No pleural effusion. No pneumothorax. Enlarged cardiac and mediastinal contours, likely unchanged from prior exam when accounting for differences in lung volumes. No focal airspace opacity. No definite evidence of overt pulmonary edema. No displaced rib fractures. Visualized upper abdomen is unremarkable. IMPRESSION: Enlarged cardiac and mediastinal contours, likely unchanged from prior exam when accounting for differences in lung volumes. There is mild pulmonary venous congestion without evidence of overt pulmonary edema. Electronically Signed   By: Marin Roberts M.D.   On: 10/01/2022 16:45     Vent. 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 -Admitted to [facility], discussed with renal, IR consult already entered by renal team for declotting tomorrow -Hold aspirin until procedure is done  ESRD -HD yesterday, he denies any nausea, vomiting. -Dialysis per renal -Continue with home dose torsemide  Hyperkalemia -potassium at 5.3, continue with telemetry monitoring, received Lokelma   hypothyroidism - Continue with Synthroid   Diabetes mellitus -Resume home dose insulin at a lower dose of 15> 8 units as BMP after midnight for procedure, will add insulin sliding scale  Orthostatic hypotension -Continue with home dose midodrine  GERD -Continue with PPI  Intellectual disability -Continue with supportive care  BPH -Continue with Flomax    DVT Prophylaxis Heparin to start tomorrow after procedure.  AM Labs Ordered, also please review Full Orders  Family Communication: Admission, patients condition and plan of care including tests being ordered have been discussed with the patient(left voicemail to his legal guardian Doretha Imus )  Code Status full  Likely DC to  back to SNF  Condition GUARDED    Consults called: renal Dr Hollie Salk    Admission status: inpatient    Time spent in minutes : 52 minutes   Phillips Climes M.D on 10/01/2022 at 8:44 PM   [facility] - Office  [phone]

## 2022-10-01 NOTE — ED Notes (Signed)
[facility] called to page vascular for EDP.

## 2022-10-01 NOTE — ED Triage Notes (Signed)
Pt brought here from the [facility] center due to clogged port per the staff; pt states he has not had dialysis in a week  Pt denies any pain

## 2022-10-01 NOTE — ED Notes (Signed)
Pt assisted to bathroom with wheelchair.  Pt states he had a large bowl movement

## 2022-10-01 NOTE — ED Provider Notes (Addendum)
[facility] EMERGENCY DEPARTMENT Provider Note   CSN: [id number] Arrival date & time: 10/01/22  1517     History  Chief Complaint  Patient presents with   Vascular Access Problem    Zachary [name] is a 76 y.o. male.  HPI   This patient is a 76 year old male with a history of diabetes on insulin, end-stage renal disease on dialysis with his left upper extremity fistula.  He is also on high cholesterol medications and a baby aspirin.  He presents from the [facility] with the reports that his dialysis fistula is not working.  The patient has no complaints, he has no fevers no shortness of breath no swelling, in fact he states the only reason he is here is because of his "arm not working".  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 Authorizing Provider  aspirin EC 81 MG tablet Take 81 mg by mouth daily. 0900   Yes [provider]  atorvastatin (LIPITOR) 20 MG tablet Take 20 mg by mouth at bedtime. 2000   Yes [provider]  Insulin Glargine (BASAGLAR KWIKPEN) 100 UNIT/ML SOPN Inject 15 Units into the skin at bedtime. 2100 DO NOT HOLD this insulin without provider notification 07/17/20  Yes [provider]  levothyroxine (SYNTHROID) 125 MCG tablet Take 125 mcg by mouth at bedtime. 2100   Yes [provider]  midodrine (PROAMATINE) 10 MG tablet Take 10 mg by mouth See admin instructions. Send med with resident on  Tues/Thurs/Sat to dialysis. do not give at [facility], give to resident to take with him. send with '5mg'$  to equal '15mg'$ . 6:00 06/01/19  Yes [provider]  midodrine (PROAMATINE) 5 MG tablet Take 5 mg by mouth See admin instructions. Send with resident on dialysis days.  Tues/Thurs/Sat  Take along with 10 mg to equal 15 mg 6:00 06/01/19  Yes [provider]  omega-3 acid ethyl esters (LOVAZA) 1 g  capsule Take 2 g by mouth at bedtime. 2100 06/03/19  Yes [provider]  omeprazole (PRILOSEC) 40 MG capsule Take 40 mg by mouth daily. 0900 01/23/20  Yes [provider]  polyethylene glycol (MIRALAX / GLYCOLAX) packet Take 17 g by mouth daily as needed for mild constipation.  05/27/20  Yes [provider]  sevelamer carbonate (RENVELA) 800 MG tablet Take 2,400 mg by mouth in the morning and at bedtime. 07/11/20  Yes [provider]  sevelamer carbonate (RENVELA) 800 MG tablet Take 2,400 mg by mouth daily.   Yes [provider]  tamsulosin (FLOMAX) 0.4 MG CAPS capsule Take 0.4 mg by mouth daily at 6 PM. Give 30 minutes after a meal. Do not crush or chew   Yes [provider]  torsemide (DEMADEX) 20 MG tablet Take 20 mg by mouth as directed. Sunday, Monday, Wednesday, and Friday   Yes [provider]  vitamin B-12 (CYANOCOBALAMIN) 1000 MCG tablet Take 1,000 mcg by mouth daily.   Yes [provider]  Insulin Pen Needle (BD AUTOSHIELD DUO) 30G X 5 MM MISC by Does not apply route. 3/16"    [provider]  NON FORMULARY Diet: NAS, Cons CHO    [provider]      Allergies    Patient has no known allergies.    Review of Systems   Review of Systems  Unable to perform ROS: Other  Physical Exam Updated Vital Signs BP (!) 149/89 (BP Location: Right Arm)   Pulse 78   Temp 98.7 F (37.1 C) (Oral)   Resp 18   Ht 1.727 m ('5\' 8"'$ )   Wt 93.3 kg   SpO2 100%   BMI 31.29 kg/m  Physical Exam Vitals and nursing note reviewed.  Constitutional:      General: He is not in acute distress.    Appearance: He is well-developed.  HENT:     Head: Normocephalic and atraumatic.     Mouth/Throat:     Pharynx: No oropharyngeal exudate.  Eyes:     General: No scleral icterus.       Right eye: No discharge.        Left eye: No discharge.     Conjunctiva/sclera: Conjunctivae normal.     Pupils: Pupils are equal, round,  and reactive to light.  Neck:     Thyroid: No thyromegaly.     Vascular: No JVD.  Cardiovascular:     Rate and Rhythm: Normal rate and regular rhythm.     Heart sounds: Normal heart sounds. No murmur heard.    No friction rub. No gallop.  Pulmonary:     Effort: Pulmonary effort is normal. No respiratory distress.     Breath sounds: Normal breath sounds. No wheezing or rales.  Abdominal:     General: Bowel sounds are normal. There is no distension.     Palpations: Abdomen is soft. There is no mass.     Tenderness: There is no abdominal tenderness.  Musculoskeletal:        General: No tenderness. Normal range of motion.     Cervical back: Normal range of motion and neck supple.     Comments: Fistula in the left upper extremity has no thrill, mild pulsations are felt, there is no redness or tenderness or overlying erythema  Lymphadenopathy:     Cervical: No cervical adenopathy.  Skin:    General: Skin is warm and dry.     Findings: No erythema or rash.  Neurological:     Mental Status: He is alert.     Coordination: Coordination normal.     Comments: Able to follow commands without difficulty, very pleasant  Psychiatric:        Behavior: Behavior normal.     ED Results / Procedures / Treatments   Labs (all labs ordered are listed, but only abnormal results are displayed) Labs Reviewed  BASIC METABOLIC PANEL - Abnormal; Notable for the following components:      Result Value   Potassium 5.3 (*)    BUN 61 (*)    Creatinine, Ser 21.78 (*)    GFR, Estimated 2 (*)    All other components within normal limits  CBC - Abnormal; Notable for the following components:   RBC 3.61 (*)    Hemoglobin 12.5 (*)    MCV 108.0 (*)    MCH 34.6 (*)    Platelets 97 (*)    All other components within normal limits    EKG EKG Interpretation  Date/Time:  Wednesday October 01 2022 16:34:14 EST Ventricular Rate:  75 PR Interval:  204 QRS Duration: 80 QT Interval:  388 QTC  Calculation: 433 R Axis:   17 Text Interpretation: Normal sinus rhythm Normal ECG When compared with ECG of 17-Oct-2020 09:25, PREVIOUS ECG IS PRESENT Confirmed by Noemi Chapel [phone]) on 10/01/2022 4:35:46 PM  Radiology DG Chest Port 1 View  Result Date: 10/01/2022 CLINICAL DATA:  Missed dialysis. EXAM: PORTABLE CHEST 1 VIEW COMPARISON:  CXR 09/16/19 FINDINGS: No pleural effusion. No pneumothorax. Enlarged cardiac and mediastinal contours, likely unchanged from prior exam when accounting for differences in lung volumes. No focal airspace opacity. No definite evidence of overt pulmonary edema. No displaced rib fractures. Visualized upper abdomen is unremarkable. IMPRESSION: Enlarged cardiac and mediastinal contours, likely unchanged from prior exam when accounting for differences in lung volumes. There is mild pulmonary venous congestion without evidence of overt pulmonary edema. Electronically Signed   By: Marin Roberts M.D.   On: 10/01/2022 16:45    Procedures Procedures    Medications Ordered in ED Medications  sodium zirconium cyclosilicate (LOKELMA) packet 10 g (10 g Oral Given 10/01/22 1840)    ED Course/ Medical Decision Making/ A&P                           Medical Decision Making Amount and/or Complexity of Data Reviewed Labs: ordered. Radiology: ordered. ECG/medicine tests: ordered.  Risk Prescription drug management. Decision regarding hospitalization.   This patient presents to the ED for concern of dysfunctional dialysis fistula, this involves an extensive number of treatment options, and is a complaint that carries with it a high risk of complications and morbidity.  The differential diagnosis includes clot, aneurysm, hemorrhage, infection though that seems less likely   Co morbidities that complicate the patient evaluation  Intellectual disability Hypertension End-stage renal disease on dialysis   Additional history obtained:  Additional history obtained from  electronic medical record and talking with the nursing facility.  I have reviewed the notes from the facility, she was seen by [facility] nurse practitioner today however there is no notes for this encounter.  There are notes from yesterday at [facility] hemodialysis, evidently the patient's fistula had clotted during hemodialysis and they were unable to get treatment yesterday.  Based on this I would assume that the patient dialyzes Tuesday Thursday Saturday thus has not had dialysis since Saturday. External records from outside source obtained and reviewed including notes from the nursing home yesterday,  Lab Tests:  I Ordered, and personally interpreted labs.  The pertinent results include: Potassium of 5.3   Imaging Studies ordered:  I ordered imaging studies including chest x-ray I independently visualized and interpreted imaging which showed cardiomegaly but no significant fluid overload I agree with the radiologist interpretation   Cardiac Monitoring: / EKG:  The patient was maintained on a cardiac monitor.  I personally viewed and interpreted the cardiac monitored which showed an underlying rhythm of: Normal sinus rhythm   Consultations Obtained:  I requested consultation with the radiologist Dr. Thornton Papas,  and discussed lab and imaging findings as well as pertinent plan - they recommend: They cannot perform any IR procedures here, patient will need to be transferred if needing acute intervention Dr. Hollie Salk will coordinate who fixes the fistula when the patient gets to [facility].  They do agree with Milly Jakob, have requested hospitalist admit I did discuss the case with Dr. Trula Slade who referred me to nephrology   Problem List / ED Course / Critical interventions / Medication management  Patient is in no distress but has a borderline hyperkalemia and will be given a single dose of Lokelma.  There is no EKG changes I ordered medication including Lokelma for mild  hyperkalemia Reevaluation of the patient after these medicines showed that the patient stable I have reviewed the patients home medicines and have made adjustments as needed  Social Determinants of Health:  Intellectual disability, in a nursing facility   Test / Admission - Considered:  Will be admitted I discussed the case with Dr. Waldron Labs, he has been kind enough to do the admission orders.  I have contacted the accepting team at [facility] from the ER standpoint, they are aware, I have contacted [facility], they will come to transport that the patient can be evaluated by the nephrology team and have his access fixed tonight as he is hyperkalemic         Final Clinical Impression(s) / ED Diagnoses Final diagnoses:  Hyperkalemia  ESRD (end stage renal disease) [facility])  Essential hypertension    Rx / DC Orders ED Discharge Orders     None         Noemi Chapel, MD 10/01/22 Johnnye Lana    Noemi Chapel, MD 10/01/22 1912

## 2022-10-01 NOTE — ED Notes (Signed)
[facility] at bedside to transport pt 

## 2022-10-02 ENCOUNTER — Encounter (HOSPITAL_COMMUNITY): Payer: Medicare Other

## 2022-10-02 DIAGNOSIS — N401 Enlarged prostate with lower urinary tract symptoms: Secondary | ICD-10-CM

## 2022-10-02 DIAGNOSIS — E875 Hyperkalemia: Secondary | ICD-10-CM

## 2022-10-02 DIAGNOSIS — N186 End stage renal disease: Secondary | ICD-10-CM | POA: Diagnosis not present

## 2022-10-02 DIAGNOSIS — T82590A Other mechanical complication of surgically created arteriovenous fistula, initial encounter: Secondary | ICD-10-CM | POA: Diagnosis not present

## 2022-10-02 DIAGNOSIS — R338 Other retention of urine: Secondary | ICD-10-CM

## 2022-10-02 LAB — BASIC METABOLIC PANEL
Anion gap: 12 (ref 5–15)
BUN: 70 mg/dL — ABNORMAL HIGH (ref 8–23)
CO2: 19 mmol/L — ABNORMAL LOW (ref 22–32)
Calcium: 9.4 mg/dL (ref 8.9–10.3)
Chloride: 109 mmol/L (ref 98–111)
Creatinine, Ser: 24.28 mg/dL — ABNORMAL HIGH (ref 0.61–1.24)
GFR, Estimated: 2 mL/min — ABNORMAL LOW (ref >60)
Glucose, Bld: 73 mg/dL (ref 70–99)
Potassium: 6.2 mmol/L — ABNORMAL HIGH (ref 3.5–5.1)
Sodium: 140 mmol/L (ref 135–145)

## 2022-10-02 LAB — GLUCOSE, CAPILLARY
Glucose-Capillary: 119 mg/dL — ABNORMAL HIGH (ref 70–99)
Glucose-Capillary: 81 mg/dL (ref 70–99)

## 2022-10-02 LAB — RENAL FUNCTION PANEL
Albumin: 3.3 g/dL — ABNORMAL LOW (ref 3.5–5.0)
Anion gap: 11 (ref 5–15)
BUN: 67 mg/dL — ABNORMAL HIGH (ref 8–23)
CO2: 15 mmol/L — ABNORMAL LOW (ref 22–32)
Calcium: 9.2 mg/dL (ref 8.9–10.3)
Chloride: 111 mmol/L (ref 98–111)
Creatinine, Ser: 23.39 mg/dL — ABNORMAL HIGH (ref 0.61–1.24)
GFR, Estimated: 2 mL/min — ABNORMAL LOW (ref >60)
Glucose, Bld: 88 mg/dL (ref 70–99)
Phosphorus: 8.1 mg/dL — ABNORMAL HIGH (ref 2.5–4.6)
Potassium: 6 mmol/L — ABNORMAL HIGH (ref 3.5–5.1)
Sodium: 137 mmol/L (ref 135–145)

## 2022-10-02 LAB — CBG MONITORING, ED
Glucose-Capillary: 88 mg/dL (ref 70–99)
Glucose-Capillary: 85 mg/dL (ref 70–99)

## 2022-10-02 LAB — HEPATITIS B SURFACE ANTIGEN: Hepatitis B Surface Ag: NONREACTIVE

## 2022-10-02 MED ORDER — CHLORHEXIDINE GLUCONATE CLOTH 2 % EX PADS
6.0000 | MEDICATED_PAD | Freq: Every day | CUTANEOUS | Status: DC
  Administered 2022-10-02 – 2022-10-03 (×2): 6 via TOPICAL

## 2022-10-02 MED ORDER — DEXTROSE 50 % IV SOLN
1.0000 | Freq: Once | INTRAVENOUS | Status: AC
  Administered 2022-10-02: 50 mL via INTRAVENOUS
  Filled 2022-10-02: qty 50

## 2022-10-02 MED ORDER — CEFAZOLIN SODIUM-DEXTROSE 2-4 GM/100ML-% IV SOLN: 2.0000 g | INTRAVENOUS | Status: AC

## 2022-10-02 MED ORDER — SODIUM ZIRCONIUM CYCLOSILICATE 10 G PO PACK
10.0000 g | PACK | Freq: Two times a day (BID) | ORAL | Status: DC
  Administered 2022-10-02: 10 g via ORAL
  Filled 2022-10-02: qty 1

## 2022-10-02 MED ORDER — INSULIN ASPART 100 UNIT/ML IV SOLN
6.0000 [IU] | Freq: Once | INTRAVENOUS | Status: AC
  Administered 2022-10-02: 6 [IU] via INTRAVENOUS

## 2022-10-02 MED ORDER — SODIUM ZIRCONIUM CYCLOSILICATE 10 G PO PACK: 10.0000 g | PACK | Freq: Three times a day (TID) | ORAL | Status: DC

## 2022-10-02 MED ORDER — SODIUM ZIRCONIUM CYCLOSILICATE 10 G PO PACK
10.0000 g | PACK | ORAL | Status: AC
  Filled 2022-10-02 (×2): qty 1

## 2022-10-02 NOTE — ED Notes (Signed)
Patient is now back in bed on the monitor with call bell in reach 

## 2022-10-02 NOTE — Progress Notes (Signed)
Notified Dr. Sloan Leiter that patient is refusing his lokelma stating he can't take it. MD acknowledged and asked RN to attempt again at a later time due to patient's intellectual disability. IV dextrose and insulin have been given. RN will attempt at a later time to give lokelma.

## 2022-10-02 NOTE — Consult Note (Addendum)
[facility] Renal Consultation Note    Indication for Consultation:  Management of ESRD/hemodialysis, anemia, hypertension/volume, and secondary hyperparathyroidism. PCP:  HPI: Zachary [name] is a 76 y.o. male with ESRD on HD TTS, HTN, T2DM, intellectual disability, Hx colon cancer s/p total colectomy with colorectal anastomosis 2018 who was transferred from [facility] with concern for hyperkalemia and dialysis access dysfunction.  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 It was felt that this AVF was not amenable to intervention, plan was to be referred to vascular surgery for [facility] and new access placement in the near future. He was dialyzed after this on 11/30 and 12/2 without issue. On 12/5, he presented to dialysis and nurse felt that the AVF was "clotted" - he was unable to be dialyzed on that day. The clinic called around to try to get him an urgent appt for [facility], but apparently they couldn't find anything so he was directed to [facility] ED. From there - he was treated for hyperkalemia and transferred to [facility].  Seen in ED today - d/t his MR it is difficult to have a full conversation. He denies CP or dyspnea. No other acute symptoms.  IR has been consulted  -- initially it was for a declot, I spoke to the lab and changed the order to [facility] placement.  Past Medical History:  Diagnosis Date   Abnormal CT scan, kidney 10/06/2011   Acute pyelonephritis 10/07/2011   Anemia    normocytic   Anxiety    mental retardation   Bladder wall thickening 10/06/2011   BPH (benign prostatic hypertrophy)    Diabetes mellitus    Dialysis patient ([place])    Tuesday, Thursday and  Saturday,    DVT of leg (deep venous thrombosis) ([place]) 12/25/2016   Edema     history of lower extremity edema   GERD (gastroesophageal reflux disease)    Heme positive stool    Hydronephrosis    Hyperkalemia    Hyperlipidemia    Hypernatremia    Hypertension    Hypothyroidism    Impaired speech    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[place])    MR (mental retardation)    Muscle weakness    Obstructive uropathy    Perinephric abscess 10/07/2011   Poor historian poor historian   Primary colorectal adenocarcinoma ([place]) 04/2017   S/p total colectomy   Protein calorie malnutrition ([place])    Pyelonephritis    Renal failure (ARF), acute on chronic (HCC)    Renal insufficiency    chronic history   Sepsis ([place])    Smoking    Uremia    Urinary retention    UTI (lower urinary tract infection) 10/06/2011   Past Surgical History:  Procedure Laterality Date   A/V FISTULAGRAM N/A 08/13/2018   Procedure: A/V FISTULAGRAM - Right Upper;  Surgeon: Elam Dutch, MD;  Location: [place] [facility];  Service: Cardiovascular;  Laterality: N/A;   A/V FISTULAGRAM N/A 11/22/2018   Procedure: A/V FISTULAGRAM - Right Upper;  Surgeon: Waynetta Sandy, MD;  Location: [place] [facility];  Service: Cardiovascular;  Laterality: N/A;   AV FISTULA PLACEMENT Left  07/06/2015   Procedure:  INSERTION LEFT ARM ARTERIOVENOUS GORTEX GRAFT;  Surgeon: Angelia Mould, MD;  Location: [place];  Service: Vascular;  Laterality: Left;   AV FISTULA PLACEMENT Right 02/26/2016   Procedure: ARTERIOVENOUS (AV) FISTULA CREATION ;  Surgeon: Angelia Mould, MD;  Location: [place];  Service: Vascular;  Laterality: Right;   AV FISTULA PLACEMENT Right 11/25/2018   Procedure: INSERTION OF ARTERIOVENOUS (AV) ARTEGRAFT RIGHT UPPER ARM;  Surgeon: Waynetta Sandy, MD;  Location: [place];  Service: Vascular;  Laterality: Right;   AV FISTULA PLACEMENT Left 05/28/2021   Procedure: LEFT ARM ARTERIOVENOUS (AV)  FISTULA;  Surgeon: Rosetta Posner, MD;  Location: [facility];  Service: Vascular;  Laterality: Left;   [place] REMOVAL Left 10/09/2015   Procedure: REMOVAL OF ARTERIOVENOUS GORETEX GRAFT ([place]) Evacuation of Lymphocele, Vein Patch angioplasty of brachial artery.;  Surgeon: Angelia Mould, MD;  Location: [place];  Service: Vascular;  Laterality: Left;   [place] Right 02/26/2016   Procedure: Right [place];  Surgeon: Angelia Mould, MD;  Location: [place];  Service: Vascular;  Laterality: Right;   [place] Left 07/30/2021   Procedure: LEFT ARM SECOND STAGE BASILIC VEIN TRANSPOSITION;  Surgeon: Rosetta Posner, MD;  Location: [facility];  Service: Vascular;  Laterality: Left;   CIRCUMCISION N/A 01/04/2014   Procedure: CIRCUMCISION ADULT (procedure #1);  Surgeon: Marissa Nestle, MD;  Location: [facility];  Service: Urology;  Laterality: N/A;   COLECTOMY N/A 05/04/2017   Procedure: TOTAL COLECTOMY;  Surgeon: Aviva Signs, MD;  Location: [facility];  Service: General;  Laterality: N/A;   COLONOSCOPY N/A 04/27/2017   Surgeon: Daneil Dolin, MD; annular mass in the ascending colon likely representing cancer biopsied, multiple 6-22 mm polyps removed, clean rectum.  Pathology with multiple tubular adenomas, high-grade dysplasia noted in ascending colon and splenic flexure.   CYSTOSCOPY W/ RETROGRADES Bilateral 06/29/2015   Procedure: CYSTOSCOPY, DILATION OF URETHRAL STRICTURE WITH BILATERAL RETROGRADE PYELOGRAM,SUPRAPUBIC TUBE CHANGE;  Surgeon: Festus Aloe, MD;  Location: [facility];  Service: Urology;  Laterality: Bilateral;   CYSTOSCOPY WITH URETHRAL DILATATION N/A 12/29/2013   Procedure: CYSTOSCOPY WITH URETHRAL DILATATION;  Surgeon: Marissa Nestle, MD;  Location: [facility];  Service: Urology;  Laterality: N/A;   ESOPHAGOGASTRODUODENOSCOPY N/A 04/27/2017   Procedure: ESOPHAGOGASTRODUODENOSCOPY (EGD);  Surgeon: Daneil Dolin, MD;  Location[facility];  Service: Endoscopy;  Laterality: N/A;   FLEXIBLE SIGMOIDOSCOPY N/A 12/02/2021   Procedure: FLEXIBLE SIGMOIDOSCOPY;  Surgeon: Eloise Harman, DO;  [facility];  Service: Endoscopy;  Laterality: N/A;  10:30am, dialysis patient   INSERTION OF DIALYSIS CATHETER Right 11/25/2018   Procedure: INSERTION OF DIALYSIS CATHETER RIGHT INTERNAL JUGULAR;  Surgeon: Waynetta Sandy, MD;  Location: [place];  Service: Vascular;  Laterality: Right;   IR AV DIALY SHUNT INTRO NEEDLE/INTRACATH INITIAL W/PTA/IMG RIGHT Right 09/07/2018   IR FLUORO GUIDE CV LINE RIGHT  10/16/2020   IR REMOVAL TUN CV CATH W/O FL  01/12/2019   IR THROMBECTOMY AV FISTULA W/THROMBOLYSIS/PTA INC/SHUNT/IMG RIGHT Right 04/26/2018   IR US GUIDE VASC ACCESS RIGHT  04/26/2018   IR US GUIDE VASC ACCESS RIGHT  09/07/2018   IR US GUIDE VASC ACCESS RIGHT  10/16/2020   IR US GUIDE VASC ACCESS RIGHT  10/16/2020   ORIF FEMUR FRACTURE Right 11/22/2016   Procedure: OPEN REDUCTION INTERNAL FIXATION (ORIF) DISTAL FEMUR FRACTURE;  Surgeon: Rod Can, MD;  Location: [place];  Service: Orthopedics;  Laterality: Right;   PATCH ANGIOPLASTY Right 12/10/2017   Procedure: PATCH ANGIOPLASTY;  Surgeon: Angelia Mould, MD;  Location: [place];  Service: Vascular;  Laterality: Right;   PERIPHERAL VASCULAR BALLOON ANGIOPLASTY  08/13/2018   Procedure: PERIPHERAL VASCULAR BALLOON ANGIOPLASTY;  Surgeon: Elam Dutch, MD;  Location: [place] [facility];  Service: Cardiovascular;;  right AV fistula    PERIPHERAL VASCULAR BALLOON ANGIOPLASTY  11/22/2018   Procedure: PERIPHERAL VASCULAR BALLOON ANGIOPLASTY;  Surgeon: Waynetta Sandy, MD;  Location: [place] [facility];  Service: Cardiovascular;;  rt AV fistula   PERIPHERAL VASCULAR CATHETERIZATION N/A 10/08/2015   Procedure: A/V Shuntogram;  Surgeon: Angelia Mould, MD;  Location: [place] [facility];  Service: Cardiovascular;  Laterality: N/A;   REMOVAL OF A DIALYSIS  CATHETER N/A 12/03/2021   Procedure: MINOR REMOVAL OF A TUNNELED DIALYSIS CATHETER;  Surgeon: Rosetta Posner, MD;  Location: [facility];  Service: Vascular;  Laterality: N/A;   THROMBECTOMY W/ EMBOLECTOMY Right 12/10/2017   Procedure: THROMBECTOMY REVISION RIGHT ARM  ARTERIOVENOUS FISTULA;  Surgeon: Angelia Mould, MD;  Location: [facility];  Service: Vascular;  Laterality: Right;   TRANSURETHRAL RESECTION OF PROSTATE N/A 01/04/2014   Procedure: TRANSURETHRAL RESECTION OF THE PROSTATE (TURP) (procedure #2);  Surgeon: Marissa Nestle, MD;  Location: [facility];  Service: Urology;  Laterality: N/A;   Family History  Problem Relation Age of Onset   Cancer Mother    Colon cancer Neg Hx    Social History:  reports that he has never smoked. He has never used smokeless tobacco. He reports that he does not drink alcohol and does not use drugs.  ROS: As per HPI otherwise negative.  Physical Exam: Vitals:   10/02/22 0900 10/02/22 1126 10/02/22 1230 10/02/22 1245  BP: (!) 169/82  (!) 170/90   Pulse: 68  78 69  Resp: '12  20 12  '$ Temp:  98.1 F (36.7 C)    TempSrc:  Oral    SpO2: 100%  100% 100%  Weight:      Height:         General: Well appearing man, NAD. Room air. Head: Normocephalic, atraumatic, sclera non-icteric, mucus membranes are moist. Neck: Supple without lymphadenopathy/masses.  Lungs: Clear bilaterally to auscultation without wheezes, rales, or rhonchi. Breathing is unlabored. Heart: RRR with normal S1, S2. No murmurs, rubs, or gallops appreciated. Abdomen: Soft, non-tender, non-distended with normoactive bowel sounds.  Musculoskeletal:  Strength and tone appear normal for age. Lower extremities: No edema or ischemic changes, no open wounds. Neuro: Alert and oriented X 3. Moves all extremities spontaneously. Dialysis Access: LUE AVF with pulsatile bruit with whistle  No Known Allergies Prior to Admission medications   Medication Sig Start Date End Date Taking? Authorizing  Provider  aspirin EC 81 MG tablet Take 81 mg by mouth daily. 0900   Yes [provider]  atorvastatin (LIPITOR) 20 MG tablet Take 20 mg by mouth at bedtime. 2000   Yes [provider]  Insulin Glargine (BASAGLAR KWIKPEN) 100 UNIT/ML SOPN Inject 15 Units into the skin at bedtime. 2100 DO NOT HOLD this insulin without provider notification 07/17/20  Yes [provider]  levothyroxine (SYNTHROID) 125 MCG tablet Take 125 mcg by mouth at bedtime. 2100   Yes [provider]  midodrine (PROAMATINE) 10 MG tablet Take 10 mg by mouth See admin instructions. Send med with resident on  Tues/Thurs/Sat to dialysis. [facility] not [facility], give to resident to take with  him. send with '5mg'$  to equal '15mg'$ . 6:00 06/01/19  Yes [provider]  midodrine (PROAMATINE) 5 MG tablet Take 5 mg by mouth See admin instructions. Send with resident on dialysis days.  Tues/Thurs/Sat  Take along with 10 mg to equal 15 mg 6:00 06/01/19  Yes [provider]  omega-3 acid ethyl esters (LOVAZA) 1 g capsule Take 2 g by mouth at bedtime. 2100 06/03/19  Yes [provider]  omeprazole (PRILOSEC) 40 MG capsule Take 40 mg by mouth daily. 0900 01/23/20  Yes [provider]  polyethylene glycol (MIRALAX / GLYCOLAX) packet Take 17 g by mouth daily as needed for mild constipation.  05/27/20  Yes [provider]  sevelamer carbonate (RENVELA) 800 MG tablet Take 2,400 mg by mouth in the morning and at bedtime. 07/11/20  Yes [provider]  sevelamer carbonate (RENVELA) 800 MG tablet Take 2,400 mg by mouth daily.   Yes [provider]  tamsulosin (FLOMAX) 0.4 MG CAPS capsule Take 0.4 mg by mouth daily at 6 PM. Give 30 minutes after a meal. Do not crush or chew   Yes [provider]  torsemide (DEMADEX) 20 MG tablet Take 20 mg by mouth as directed. Sunday, Monday, Wednesday, and Friday   Yes [provider]  vitamin B-12 (CYANOCOBALAMIN) 1000  MCG tablet Take 1,000 mcg by mouth daily.   Yes [provider]  Insulin Pen Needle (BD AUTOSHIELD DUO) 30G X 5 MM MISC by Does not apply route. 3/16"    [provider]  NON FORMULARY Diet: NAS, Cons CHO    [provider]   Current Facility-Administered Medications  Medication Dose Route Frequency Provider Last Rate Last Admin   acetaminophen (TYLENOL) tablet 650 mg  650 mg Oral Q6H PRN Elgergawy, Silver Huguenin, MD       Or   acetaminophen (TYLENOL) suppository 650 mg  650 mg Rectal Q6H PRN Elgergawy, Silver Huguenin, MD       atorvastatin (LIPITOR) tablet 20 mg  20 mg Oral QHS Elgergawy, Silver Huguenin, MD   20 mg at 10/01/22 2137   cyanocobalamin (VITAMIN B12) tablet 1,000 mcg  1,000 mcg Oral Daily Elgergawy, Silver Huguenin, MD       heparin injection 5,000 Units  5,000 Units Subcutaneous Q8H Elgergawy, Dawood S, MD       insulin aspart (novoLOG) injection 0-6 Units  0-6 Units Subcutaneous TID WC Elgergawy, Silver Huguenin, MD       insulin glargine-yfgn (SEMGLEE) injection 8 Units  8 Units Subcutaneous QHS Elgergawy, Silver Huguenin, MD       levothyroxine (SYNTHROID) tablet 125 mcg  125 mcg Oral Daily Elgergawy, Silver Huguenin, MD   125 mcg at 10/02/22 0611   midodrine (PROAMATINE) tablet 15 mg  15 mg Oral Q T,Th,Sat-1800 Elgergawy, Silver Huguenin, MD       omega-3 acid ethyl esters (LOVAZA) capsule 2 g  2 g Oral QHS Elgergawy, Silver Huguenin, MD       pantoprazole (PROTONIX) EC tablet 40 mg  40 mg Oral Daily Elgergawy, Silver Huguenin, MD   40 mg at 10/01/22 2136   polyethylene glycol (MIRALAX / GLYCOLAX) packet 17 g  17 g Oral Daily PRN Elgergawy, Silver Huguenin, MD       sevelamer carbonate (RENVELA) tablet 2,400 mg  2,400 mg Oral TID WC Elgergawy, Silver Huguenin, MD       sodium zirconium cyclosilicate (LOKELMA) packet 10 g  10 g Oral BID Ghimire, Henreitta Leber, MD  tamsulosin (FLOMAX) capsule 0.4 mg  0.4 mg Oral q1800 Elgergawy, Silver Huguenin, MD       [START ON 10/03/2022] torsemide [facility]) tablet 20 mg  20 mg Oral Once per day on  Sun Mon Wed Fri Elgergawy, Silver Huguenin, MD       Current Outpatient Medications  Medication Sig Dispense Refill   aspirin EC 81 MG tablet Take 81 mg by mouth daily. 0900     atorvastatin (LIPITOR) 20 MG tablet Take 20 mg by mouth at bedtime. 2000     Insulin Glargine (BASAGLAR KWIKPEN) 100 UNIT/ML SOPN Inject 15 Units into the skin at bedtime. 2100 DO NOT HOLD this insulin without provider notification     levothyroxine (SYNTHROID) 125 MCG tablet Take 125 mcg by mouth at bedtime. 2100     midodrine (PROAMATINE) 10 MG tablet Take 10 mg by mouth See admin instructions. Send med with resident on  Tues/Thurs/Sat to dialysis. [facility] not [facility], give to resident to take with him. send with '5mg'$  to equal '15mg'$ . 6:00     midodrine (PROAMATINE) 5 MG tablet Take 5 mg by mouth See admin instructions. Send with resident on dialysis days.  Tues/Thurs/Sat  Take along with 10 mg to equal 15 mg 6:00     omega-3 acid ethyl esters (LOVAZA) 1 g capsule Take 2 g by mouth at bedtime. 2100     omeprazole (PRILOSEC) 40 MG capsule Take 40 mg by mouth daily. 0900     polyethylene glycol (MIRALAX / GLYCOLAX) packet Take 17 g by mouth daily as needed for mild constipation.      sevelamer carbonate (RENVELA) 800 MG tablet Take 2,400 mg by mouth in the morning and at bedtime.     sevelamer carbonate (RENVELA) 800 MG tablet Take 2,400 mg by mouth daily.     tamsulosin (FLOMAX) 0.4 MG CAPS capsule Take 0.4 mg by mouth daily at 6 PM. Give 30 minutes after a meal. Do not crush or chew     torsemide (DEMADEX) 20 MG tablet Take 20 mg by mouth as directed. Sunday, Monday, Wednesday, and Friday     vitamin B-12 (CYANOCOBALAMIN) 1000 MCG tablet Take 1,000 mcg by mouth daily.     Insulin Pen Needle (BD AUTOSHIELD DUO) 30G X 5 MM MISC by Does not apply route. 3/16"     NON FORMULARY Diet: NAS, Cons CHO     Labs: Basic Metabolic Panel: Recent Labs  Lab 09/29/22 0818 10/01/22 1611  NA 139 139  K 4.7 5.3*  CL 106 103  CO2  21* 22  GLUCOSE 112* 97  BUN 44* 61*  CREATININE 16.87* 21.78*  CALCIUM 9.3 9.3   Liver Function Tests: Recent Labs  Lab 09/29/22 0818  AST 14*  ALT 12  ALKPHOS 54  BILITOT 0.6  PROT 7.0  ALBUMIN 3.5   CBC: Recent Labs  Lab 09/29/22 0818 10/01/22 1611  WBC 4.3 4.0  HGB 12.9* 12.5*  HCT 41.1 39.0  MCV 108.4* 108.0*  PLT 108* 97*   Studies/Results: DG Chest Port 1 View  Result Date: 10/01/2022 CLINICAL DATA:  Missed dialysis. EXAM: PORTABLE CHEST 1 VIEW COMPARISON:  CXR 09/16/19 FINDINGS: No pleural effusion. No pneumothorax. Enlarged cardiac and mediastinal contours, likely unchanged from prior exam when accounting for differences in lung volumes. No focal airspace opacity. No definite evidence of overt pulmonary edema. No displaced rib fractures. Visualized upper abdomen is unremarkable. IMPRESSION: Enlarged cardiac and mediastinal contours, likely unchanged from prior exam when  accounting for differences in lung volumes. There is mild pulmonary venous congestion without evidence of overt pulmonary edema. Electronically Signed   By: Marin Roberts M.D.   On: 10/01/2022 16:45    Dialysis Orders:  TTS at [facility] 4:15hr, 400/500, EDW 95kg (getting under recently), AVF, 2K/2.5Ca, heparin 1600units/hr - no ESA - calcitriol 65mg PO q HD  Assessment/Plan:  LUE AVF dysfunction: After speaking with outpatient unit and [facility] interventional nephrologist who performed fistulogram 11/28 -> felt that this AVF is unsalvageable at this time. Needs TDC placement and can pursue new access as outpatient. Although technically not clotted - having issues with prolonged bleeding so I'd rather not use it.  ESRD:  Usual TTS schedule - last HD was 12/2. Ordered for Lokelma 10g BID today and plan to dialyze after new TDC placement tomorrow.  Hypertension/volume: BP high, no edema on exam. No acute respiratory issues at the moment, should improve with HD tomorrow.  Anemia: Hgb > 12, no ESA  needed.  Metabolic bone disease: Ca ok, Phos pending. Would continue home meds. T2DM: per primary.  Intellectual disability: Has POA KCandiss Norse [place]12/04/2022, 1:17 PM  [employer]

## 2022-10-02 NOTE — Progress Notes (Signed)
Patient refused Lokelma again. Patient re-educated by RN about importance of taking medicine.

## 2022-10-02 NOTE — Progress Notes (Addendum)
PROGRESS NOTE        PATIENT DETAILS Name: Zachary [name] Age: 76 y.o. Sex: male Date of Birth: [birth year] Admit Date: 10/01/2022 Admitting Physician Albertine Patricia, MD OAC:ZYSAY, Phylis Bougie, NP  Brief Summary: Patient is a 76 y.o.  male with history of ESRD on HD-[facility] SNF to [facility] for malfunctioning [facility]F, subsequently referred to [facility] for IR eval for declot procedure.  Significant events: 12/6>> admit to [facility]-malfunctioning AVF.  Significant studies: 12/6>> CXR: No PNA.  Significant microbiology data: None  Procedures: None  Consults: IR Nephrology  Subjective: Lying comfortably in bed-denies any chest pain or shortness of breath.  Objective: Vitals: Blood pressure (!) 170/84, pulse 66, temperature 98.3 F (36.8 C), temperature source Oral, resp. rate 11, height '5\' 8"'$  (1.727 m), weight 93.3 kg, SpO2 98 %.   Exam: Gen Exam:not in any distress HEENT:atraumatic, normocephalic Chest: B/L clear to auscultation anteriorly CVS:S1S2 regular Abdomen:soft non tender, non distended Extremities:no edema Neurology: Non focal Skin: no rash  Pertinent Labs/Radiology:    Latest Ref Rng & Units 10/01/2022    4:11 PM 09/29/2022    8:18 AM 01/28/2022    4:00 AM  CBC  WBC 4.0 - 10.5 K/uL 4.0  4.3  4.8   Hemoglobin 13.0 - 17.0 g/dL 12.5  12.9  8.7   Hematocrit 39.0 - 52.0 % 39.0  41.1  26.0   Platelets 150 - 400 K/uL 97  108  113     Lab Results  Component Value Date   NA 139 10/01/2022   K 5.3 (H) 10/01/2022   CL 103 10/01/2022   CO2 22 10/01/2022      Assessment/Plan: Malfunctioning AV fistula Keep n.p.o. IR eval for declot today.  Addendum Spoke with IR team-they are unable to do his declot today given their busy schedule.  Spoke with vascular-Dr. Dorisann Frames that he will see regarding long-term access, but advised that we continue with IR eval for declot/TDC if needed.  Subsequently spoke with nephrologist-Dr. Patel-unclear when  patient actually had his last HD-lab work ordered stat this morning never done.  I have reached out to RN to see if we can get morning labs done to see where his electrolytes today.    ESRD HD per nephrology once AV fistula is declotted.  Hyperkalemia Mild Given Lokelma yesterday Await repeat electrolytes today  DM-2 (A1c 5.0 on 12/4) CBG stable with SSI and 8 units of Semglee.  Recent Labs    10/01/22 2356 10/02/22 0840  GLUCAP 82 88     Orthostatic hypotension Continue with midodrine  Hypothyroidism Synthroid Most recent TSH minimally elevated-follow-up with PCP for repeat TSH in the next few weeks  GERD PPI  BPH Flomax  Intellectual disability Seems to be at baseline  Obesity: Estimated body mass index is 31.29 kg/m as calculated from the following:   Height as of this encounter: '5\' 8"'$  (1.727 m).   Weight as of this encounter: 93.3 kg.   Code status:   Code Status: Full Code   DVT Prophylaxis: heparin injection 5,000 Units Start: 10/02/22 2200 SCDs Start: 10/01/22 2043   Family Communication: None at bedside   Disposition Plan: Status is: Inpatient Remains inpatient appropriate because: Malfunctioning arteriovenous fistula   Planned Discharge Destination:Skilled nursing facility   Diet: Diet Order  Diet NPO time specified Except for: Sips with Meds  Diet effective midnight                     Antimicrobial agents: Anti-infectives (From admission, onward)    None        MEDICATIONS: Scheduled Meds:  atorvastatin  20 mg Oral QHS   cyanocobalamin  1,000 mcg Oral Daily   heparin  5,000 Units Subcutaneous Q8H   insulin aspart  0-6 Units Subcutaneous TID WC   insulin glargine-yfgn  8 Units Subcutaneous QHS   levothyroxine  125 mcg Oral Daily   midodrine  15 mg Oral Q T,Th,Sat-1800   omega-3 acid ethyl esters  2 g Oral QHS   pantoprazole  40 mg Oral Daily   sevelamer carbonate  2,400 mg Oral TID WC   tamsulosin  0.4  mg Oral q1800   [START ON 10/03/2022] torsemide  20 mg Oral Once per day on Sun Mon Wed Fri   Continuous Infusions: PRN Meds:.acetaminophen **OR** acetaminophen, polyethylene glycol   I have personally reviewed following labs and imaging studies  LABORATORY DATA: CBC: Recent Labs  Lab 09/29/22 0818 10/01/22 1611  WBC 4.3 4.0  HGB 12.9* 12.5*  HCT 41.1 39.0  MCV 108.4* 108.0*  PLT 108* 97*    Basic Metabolic Panel: Recent Labs  Lab 09/29/22 0818 10/01/22 1611  NA 139 139  K 4.7 5.3*  CL 106 103  CO2 21* 22  GLUCOSE 112* 97  BUN 44* 61*  CREATININE 16.87* 21.78*  CALCIUM 9.3 9.3    GFR: Estimated Creatinine Clearance: 3.3 mL/min (A) (by C-G formula based on SCr of 21.78 mg/dL (H)).  Liver Function Tests: Recent Labs  Lab 09/29/22 0818  AST 14*  ALT 12  ALKPHOS 54  BILITOT 0.6  PROT 7.0  ALBUMIN 3.5   No results for input(s): "LIPASE", "AMYLASE" in the last 168 hours. No results for input(s): "AMMONIA" in the last 168 hours.  Coagulation Profile: No results for input(s): "INR", "PROTIME" in the last 168 hours.  Cardiac Enzymes: No results for input(s): "CKTOTAL", "CKMB", "CKMBINDEX", "TROPONINI" in the last 168 hours.  BNP (last 3 results) No results for input(s): "PROBNP" in the last 8760 hours.  Lipid Profile: No results for input(s): "CHOL", "HDL", "LDLCALC", "TRIG", "CHOLHDL", "LDLDIRECT" in the last 72 hours.  Thyroid Function Tests: No results for input(s): "TSH", "T4TOTAL", "FREET4", "T3FREE", "THYROIDAB" in the last 72 hours.  Anemia Panel: No results for input(s): "VITAMINB12", "FOLATE", "FERRITIN", "TIBC", "IRON", "RETICCTPCT" in the last 72 hours.  Urine analysis:    Component Value Date/Time   COLORURINE AMBER (A) 06/12/2017 0615   APPEARANCEUR HAZY (A) 06/12/2017 0615   LABSPEC 1.021 06/12/2017 0615   PHURINE 5.0 06/12/2017 0615   GLUCOSEU NEGATIVE 06/12/2017 0615   HGBUR NEGATIVE 06/12/2017 0615   BILIRUBINUR NEGATIVE  06/12/2017 0615   KETONESUR NEGATIVE 06/12/2017 0615   PROTEINUR 30 (A) 06/12/2017 0615   UROBILINOGEN 0.2 07/20/2015 1850   NITRITE NEGATIVE 06/12/2017 0615   LEUKOCYTESUR NEGATIVE 06/12/2017 0615    Sepsis Labs: Lactic Acid, Venous    Component Value Date/Time   LATICACIDVEN 0.9 01/20/2018 2505    MICROBIOLOGY: No results found for this or any previous visit (from the past 240 hour(s)).  RADIOLOGY STUDIES/RESULTS: DG Chest Port 1 View  Result Date: 10/01/2022 CLINICAL DATA:  Missed dialysis. EXAM: PORTABLE CHEST 1 VIEW COMPARISON:  CXR 09/16/19 FINDINGS: No pleural effusion. No pneumothorax. Enlarged cardiac and mediastinal contours, likely unchanged from  prior exam when accounting for differences in lung volumes. No focal airspace opacity. No definite evidence of overt pulmonary edema. No displaced rib fractures. Visualized upper abdomen is unremarkable. IMPRESSION: Enlarged cardiac and mediastinal contours, likely unchanged from prior exam when accounting for differences in lung volumes. There is mild pulmonary venous congestion without evidence of overt pulmonary edema. Electronically Signed   By: Marin Roberts M.D.   On: 10/01/2022 16:45     LOS: 1 day   Oren Binet, MD  [facility]    To contact the attending provider between 7A-7P or the covering provider during after hours 7P-7A, please log into the web site [email or web address] and access using universal [place] password for that web site. If you do not have the password, please call the hospital operator.  10/02/2022, 9:32 AM

## 2022-10-02 NOTE — ED Notes (Signed)
Patient provided with something to eat and drink at this time.  ?

## 2022-10-02 NOTE — ED Notes (Signed)
Patient is now back in bed on the monitor checked patient cbg it was 79 patient is resting with call bell in reach

## 2022-10-02 NOTE — ED Notes (Signed)
Wheeled patient to the bathroom patient did well 

## 2022-10-02 NOTE — ED Notes (Signed)
ED TO INPATIENT HANDOFF REPORT  ED Nurse Name and Phone #: Kristene Liberati RN [phone]  S Name/Age/Gender Zachary [name] 76 y.o. male Room/Bed: 037C/037C  Code Status   Code Status: Full Code  Home/SNF/Other Home Patient oriented to: self, place, and situation Is this baseline? Yes   Triage Complete: Triage complete  Chief Complaint Dialysis AV fistula malfunction ([place]) [T82.590A]  Triage Note Pt brought here from the [facility] due to clogged port per the staff; pt states he has not had dialysis in a week  Pt denies any pain   Allergies No Known Allergies  Level of Care/Admitting Diagnosis ED Disposition     ED Disposition  Admit   Condition  --   [facility]: [place] [[id number]]  Level of Care: Telemetry Medical [104]  May admit patient to [facility] or [facility] if equivalent level of care is available:: No  Covid Evaluation: Asymptomatic - no recent exposure (last 10 days) testing not required  Diagnosis: Dialysis AV fistula malfunction [facility]) [[id number]]  Admitting Physician: Manfred Shirts  Attending Physician: Waldron Labs, DAWOOD S [[id number]]  Certification:: I certify this patient will need inpatient services for at least 2 midnights  Estimated Length of Stay: 2          B Medical/Surgery History Past Medical History:  Diagnosis Date   Abnormal CT scan, kidney 10/06/2011   Acute pyelonephritis 10/07/2011   Anemia    normocytic   Anxiety    mental retardation   Bladder wall thickening 10/06/2011   BPH (benign prostatic hypertrophy)    Diabetes mellitus    Dialysis patient ([place])    Tuesday, Thursday and Saturday,    DVT of leg (deep venous thrombosis) ([place]) 12/25/2016   Edema     history of lower extremity edema   GERD (gastroesophageal reflux disease)    Heme positive stool    Hydronephrosis    Hyperkalemia    Hyperlipidemia    Hypernatremia    Hypertension    Hypothyroidism    Impaired speech    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[name])    MR (mental retardation)    Muscle weakness    Obstructive uropathy    Perinephric abscess 10/07/2011   Poor historian poor historian   Primary colorectal adenocarcinoma ([place]) 04/2017   S/p total colectomy   Protein calorie malnutrition ([place])    Pyelonephritis    Renal failure (ARF), acute on chronic (HCC)    Renal insufficiency    chronic history   Sepsis ([place])    Smoking    Uremia    Urinary retention    UTI (lower urinary tract infection) 10/06/2011   Past Surgical History:  Procedure Laterality Date   A/V FISTULAGRAM N/A 08/13/2018   Procedure: A/V FISTULAGRAM - Right Upper;  Surgeon: Elam Dutch, MD;  Location: [place] CV LAB;  Service: Cardiovascular;  Laterality: N/A;   A/V FISTULAGRAM N/A 11/22/2018   Procedure: A/V FISTULAGRAM - Right Upper;  Surgeon: Waynetta Sandy, MD;  Location: [place] CV [facility];  Service: Cardiovascular;  Laterality: N/A;   AV FISTULA PLACEMENT Left 07/06/2015   Procedure:  INSERTION LEFT ARM ARTERIOVENOUS GORTEX GRAFT;  Surgeon: Angelia Mould, MD;  Location: [place];  Service: Vascular;  Laterality: Left;   AV FISTULA PLACEMENT Right 02/26/2016   Procedure: ARTERIOVENOUS (AV) FISTULA CREATION ;  Surgeon: Angelia Mould, MD;  Location: [place];  Service: Vascular;  Laterality: Right;   AV  FISTULA PLACEMENT Right 11/25/2018   Procedure: INSERTION OF ARTERIOVENOUS (AV) ARTEGRAFT RIGHT UPPER ARM;  Surgeon: Waynetta Sandy, MD;  Location: [place];  Service: Vascular;  Laterality: Right;   AV FISTULA PLACEMENT Left 05/28/2021   Procedure: LEFT ARM ARTERIOVENOUS (AV) FISTULA;  Surgeon: Rosetta Posner, MD;  Location: [facility];  Service: Vascular;  Laterality: Left;   [place] REMOVAL Left 10/09/2015   Procedure: REMOVAL OF ARTERIOVENOUS GORETEX GRAFT ([place]) Evacuation of Lymphocele, Vein Patch angioplasty of brachial artery.;  Surgeon: Angelia Mould, MD;  Location: [place];  Service:  Vascular;  Laterality: Left;   [place] Right 02/26/2016   Procedure: Right [place];  Surgeon: Angelia Mould, MD;  Location: [place];  Service: Vascular;  Laterality: Right;   [place] Left 07/30/2021   Procedure: LEFT ARM SECOND STAGE BASILIC VEIN TRANSPOSITION;  Surgeon: Rosetta Posner, MD;  Location: [facility];  Service: Vascular;  Laterality: Left;   CIRCUMCISION N/A 01/04/2014   Procedure: CIRCUMCISION ADULT (procedure #1);  Surgeon: Marissa Nestle, MD;  Location: [facility];  Service: Urology;  Laterality: N/A;   COLECTOMY N/A 05/04/2017   Procedure: TOTAL COLECTOMY;  Surgeon: Aviva Signs, MD;  Location: [facility];  Service: General;  Laterality: N/A;   COLONOSCOPY N/A 04/27/2017   Surgeon: Daneil Dolin, MD; annular mass in the ascending colon likely representing cancer biopsied, multiple 6-22 mm polyps removed, clean rectum.  Pathology with multiple tubular adenomas, high-grade dysplasia noted in ascending colon and splenic flexure.   CYSTOSCOPY W/ RETROGRADES Bilateral 06/29/2015   Procedure: CYSTOSCOPY, DILATION OF URETHRAL STRICTURE WITH BILATERAL RETROGRADE PYELOGRAM,SUPRAPUBIC TUBE CHANGE;  Surgeon: Festus Aloe, MD;  Location: [facility];  Service: Urology;  Laterality: Bilateral;   CYSTOSCOPY WITH URETHRAL DILATATION N/A 12/29/2013   Procedure: CYSTOSCOPY WITH URETHRAL DILATATION;  Surgeon: Marissa Nestle, MD;  Location: [facility];  Service: Urology;  Laterality: N/A;   ESOPHAGOGASTRODUODENOSCOPY N/A 04/27/2017   Procedure: ESOPHAGOGASTRODUODENOSCOPY (EGD);  Surgeon: Daneil Dolin, MD;  [facility];  Service: Endoscopy;  Laterality: N/A;   FLEXIBLE SIGMOIDOSCOPY N/A 12/02/2021   Procedure: FLEXIBLE SIGMOIDOSCOPY;  Surgeon: Eloise Harman, DO;  [facility];  Service: Endoscopy;  Laterality: N/A;  10:30am, dialysis patient   INSERTION OF DIALYSIS CATHETER Right 11/25/2018   Procedure: INSERTION OF  DIALYSIS CATHETER RIGHT INTERNAL JUGULAR;  Surgeon: Waynetta Sandy, MD;  Location: [place];  Service: Vascular;  Laterality: Right;   IR AV DIALY SHUNT INTRO NEEDLE/INTRACATH INITIAL W/PTA/IMG RIGHT Right 09/07/2018   IR FLUORO GUIDE CV LINE RIGHT  10/16/2020   IR REMOVAL TUN CV CATH W/O FL  01/12/2019   IR THROMBECTOMY AV FISTULA W/THROMBOLYSIS/PTA INC/SHUNT/IMG RIGHT Right 04/26/2018   IR US GUIDE VASC ACCESS RIGHT  04/26/2018   IR US GUIDE VASC ACCESS RIGHT  09/07/2018   IR US GUIDE VASC ACCESS RIGHT  10/16/2020   IR US GUIDE VASC ACCESS RIGHT  10/16/2020   ORIF FEMUR FRACTURE Right 11/22/2016   Procedure: OPEN REDUCTION INTERNAL FIXATION (ORIF) DISTAL FEMUR FRACTURE;  Surgeon: Rod Can, MD;  Location: [place];  Service: Orthopedics;  Laterality: Right;   PATCH ANGIOPLASTY Right 12/10/2017   Procedure: PATCH ANGIOPLASTY;  Surgeon: Angelia Mould, MD;  Location: [place];  Service: Vascular;  Laterality: Right;   PERIPHERAL VASCULAR BALLOON ANGIOPLASTY  08/13/2018   Procedure: PERIPHERAL VASCULAR BALLOON ANGIOPLASTY;  Surgeon: Elam Dutch, MD;  Location: [place] [facility];  Service: Cardiovascular;;  right AV fistula    PERIPHERAL VASCULAR BALLOON ANGIOPLASTY  11/22/2018   Procedure: PERIPHERAL VASCULAR BALLOON ANGIOPLASTY;  Surgeon: Waynetta Sandy, MD;  Location: [place] [facility];  Service: Cardiovascular;;  rt AV fistula   PERIPHERAL VASCULAR CATHETERIZATION N/A 10/08/2015   Procedure: A/V Shuntogram;  Surgeon: Angelia Mould, MD;  Location: [place] [facility];  Service: Cardiovascular;  Laterality: N/A;   REMOVAL OF A DIALYSIS CATHETER N/A 12/03/2021   Procedure: MINOR REMOVAL OF A TUNNELED DIALYSIS CATHETER;  Surgeon: Rosetta Posner, MD;  Location[facility];  Service: Vascular;  Laterality: N/A;   THROMBECTOMY W/ EMBOLECTOMY Right 12/10/2017   Procedure: THROMBECTOMY REVISION RIGHT ARM  ARTERIOVENOUS FISTULA;  Surgeon: Angelia Mould, MD;   Location[facility];  Service: Vascular;  Laterality: Right;   TRANSURETHRAL RESECTION OF PROSTATE N/A 01/04/2014   Procedure: TRANSURETHRAL RESECTION OF THE PROSTATE (TURP) (procedure #2);  Surgeon: Marissa Nestle, MD;  Location: [facility];  Service: Urology;  Laterality: N/A;     A IV Location/Drains/Wounds Patient Lines/Drains/Airways Status     Active Line/Drains/Airways     Name Placement date Placement time Site Days   Peripheral IV 10/02/22 Anterior;Right 10/02/22  0430  --  less than 1   Fistula / Graft Right Upper arm Arteriovenous fistula 02/26/16  1351  Upper arm  2410   Fistula / Graft 08/13/18  --  --  1511   Fistula / Graft Right Upper arm Arteriovenous vein graft 11/25/18  1131  Upper arm  1407   Fistula / Graft Left Forearm Arteriovenous fistula 05/28/21  0807  Forearm  492   Hemodialysis Catheter Right Internal jugular Double lumen Permanent (Tunneled) 10/16/20  1057  Internal jugular  716   Incision (Closed) 05/28/21 Arm Left 05/28/21  0853  -- 492   Incision (Closed) 07/30/21 Arm Left 07/30/21  0819  -- 429            Intake/Output Last 24 hours No intake or output data in the 24 hours ending 10/02/22 1435  Labs/Imaging Results for orders placed or performed during the hospital encounter of 10/01/22 (from the past 48 hour(s))  Basic metabolic panel     Status: Abnormal   Collection Time: 10/01/22  4:11 PM  Result Value Ref Range   Sodium 139 135 - 145 mmol/L   Potassium 5.3 (H) 3.5 - 5.1 mmol/L   Chloride 103 98 - 111 mmol/L   CO2 22 22 - 32 mmol/L   Glucose, Bld 97 70 - 99 mg/dL    Comment: Glucose reference range applies only to samples taken after fasting for at least 8 hours.   BUN 61 (H) 8 - 23 mg/dL   Creatinine, Ser 21.78 (H) 0.61 - 1.24 mg/dL   Calcium 9.3 8.9 - 10.3 mg/dL   GFR, Estimated 2 (L) >60 mL/min    Comment: (NOTE) Calculated using the CKD-EPI Creatinine Equation (2021)    Anion gap 14 5 - 15    Comment: Performed at [facility], [address], [place], [place] [address]  CBC     Status: Abnormal   Collection Time: 10/01/22  4:11 PM  Result Value Ref Range   WBC 4.0 4.0 - 10.5 K/uL   RBC 3.61 (L) 4.22 - 5.81 MIL/uL   Hemoglobin 12.5 (L) 13.0 - 17.0 g/dL   HCT 39.0 39.0 - 52.0 %   MCV 108.0 (H) 80.0 - 100.0 fL   MCH 34.6 (H) 26.0 - 34.0 pg  MCHC 32.1 30.0 - 36.0 g/dL   RDW 15.5 11.5 - 15.5 %   Platelets 97 (L) 150 - 400 K/uL    Comment: SPECIMEN CHECKED FOR CLOTS PLATELET COUNT CONFIRMED BY SMEAR    nRBC 0.0 0.0 - 0.2 %    Comment: Performed at [facility], [address], [place], [place] [address]  CBG monitoring, ED     Status: None   Collection Time: 10/01/22 11:56 PM  Result Value Ref Range   Glucose-Capillary 82 70 - 99 mg/dL    Comment: Glucose reference range applies only to samples taken after fasting for at least 8 hours.  CBG monitoring, ED     Status: None   Collection Time: 10/02/22  8:40 AM  Result Value Ref Range   Glucose-Capillary 88 70 - 99 mg/dL    Comment: Glucose reference range applies only to samples taken after fasting for at least 8 hours.  CBG monitoring, ED     Status: None   Collection Time: 10/02/22 12:26 PM  Result Value Ref Range   Glucose-Capillary 85 70 - 99 mg/dL    Comment: Glucose reference range applies only to samples taken after fasting for at least 8 hours.   DG Chest Port 1 View  Result Date: 10/01/2022 CLINICAL DATA:  Missed dialysis. EXAM: PORTABLE CHEST 1 VIEW COMPARISON:  CXR 09/16/19 FINDINGS: No pleural effusion. No pneumothorax. Enlarged cardiac and mediastinal contours, likely unchanged from prior exam when accounting for differences in lung volumes. No focal airspace opacity. No definite evidence of overt pulmonary edema. No displaced rib fractures. Visualized upper abdomen is unremarkable. IMPRESSION: Enlarged cardiac and mediastinal contours, likely unchanged from prior exam when accounting for differences in lung volumes. There is mild pulmonary  venous congestion without evidence of overt pulmonary edema. Electronically Signed   By: Marin Roberts M.D.   On: 10/01/2022 16:45    Pending Labs Unresulted Labs (From admission, onward)     Start     Ordered   10/03/22 0500  Renal function panel  Tomorrow morning,   R        10/02/22 0940   10/02/22 1226  Hepatitis B surface antigen  (New Admission Hemo Labs (Hepatitis B))  Once,   R        10/02/22 1226   10/02/22 1226  Hepatitis B surface antibody,quantitative  (New Admission [facility]o Labs (Hepatitis B))  Once,   R        10/02/22 1226   10/02/22 0630  Renal function panel  ONCE - STAT,   STAT        10/02/22 0629   10/02/22 0500  CBC  Tomorrow morning,   R        10/01/22 2043            Vitals/Pain Today's Vitals   10/02/22 1230 10/02/22 1245 10/02/22 1400 10/02/22 1415  BP: (!) 170/90  (!) 152/87   Pulse: 78 69 67 65  Resp: '20 12  13  '$ Temp:      TempSrc:      SpO2: 100% 100% 100% 100%  Weight:      Height:      PainSc:        Isolation Precautions No active isolations  Medications Medications  atorvastatin (LIPITOR) tablet 20 mg (20 mg Oral Given 10/01/22 2137)  midodrine (PROAMATINE) tablet 15 mg (has no administration in time range)  omega-3 acid ethyl esters (LOVAZA) capsule 2 g (0 g Oral Hold 10/01/22 2137)  torsemide (DEMADEX) tablet 20 mg (has no administration in time range)  levothyroxine (SYNTHROID) tablet 125 mcg (125 mcg Oral Given 10/02/22 0611)  pantoprazole (PROTONIX) EC tablet 40 mg (40 mg Oral Not Given 10/02/22 0907)  polyethylene glycol (MIRALAX / GLYCOLAX) packet 17 g (has no administration in time range)  sevelamer carbonate (RENVELA) tablet 2,400 mg (2,400 mg Oral Not Given 10/02/22 1249)  tamsulosin (FLOMAX) capsule 0.4 mg (has no administration in time range)  cyanocobalamin (VITAMIN B12) tablet 1,000 mcg (1,000 mcg Oral Not Given 10/02/22 0907)  heparin injection 5,000 Units (has no administration in time range)  acetaminophen (TYLENOL) tablet  650 mg (has no administration in time range)    Or  acetaminophen (TYLENOL) suppository 650 mg (has no administration in time range)  insulin aspart (novoLOG) injection 0-6 Units ( Subcutaneous Not Given 10/02/22 1232)  insulin glargine-yfgn (SEMGLEE) injection 8 Units (8 Units Subcutaneous Not Given 10/02/22 0009)  sodium zirconium cyclosilicate (LOKELMA) packet 10 g (10 g Oral Given 10/02/22 1424)  sodium zirconium cyclosilicate (LOKELMA) packet 10 g (10 g Oral Given 10/01/22 1840)    Mobility manual wheelchair Moderate fall risk   Focused Assessments Cardiac Assessment Handoff:    Lab Results  Component Value Date   TROPONINI <0.03 09/13/2015   Lab Results  Component Value Date   DDIMER 0.43 01/28/2022   Does the Patient currently have chest pain? No   , Pulmonary Assessment Handoff:  Lung sounds:   O2 Device: Room Air      R Recommendations: See Admitting Provider Note  Report given to:   Additional Notes:

## 2022-10-02 NOTE — Progress Notes (Signed)
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 I attempted to cross the stenosis to offer percutaneous endovascular treatment but was unsuccessful due to the aneurysmal termination adjacent to the stenosis. I recommended [place] ing the fistula and referring him to vascular surgery for a surgical repair of the fistula (?jump graft) or attempt at percutaneous angioplasty.

## 2022-10-02 NOTE — Consult Note (Addendum)
Hospital Consult    Reason for Consult:  malfunctioning left AV fistula Requesting Physician:  Dr. Sloan Leiter MRN #:  [id number]  History of Present Illness: This is a 76 y.o. male with pertinent medical history including Type II diabetes mellitus, intellectual disability, HLD, HTN, and ESRD on HD who presented from SNF from [facility] for malfunctioning AV Fistula. He has a left basilic vein fistula that was created by Dr. Donnetta Hutching in August of 2022 and subsequently transposed in October of 2022. The patient has been using fistula since February of this year. He reports last attempted HD two days ago however he is poor historian secondary to his MR. His dialysis center had contacted our office on 12/5 to report issues with the fistula clotting at his session. Last successful dialysis was on 12/2. Per Nephrology note, he underwent recent fistulogram on 11/28 by [facility]. At that time it was not amenable to intervention and felt to be non salvageable. He has prior failed right AV fistula and left cephalic AV fistula. He is right hand dominant.  Past Medical History:  Diagnosis Date   Abnormal CT scan, kidney 10/06/2011   Acute pyelonephritis 10/07/2011   Anemia    normocytic   Anxiety    mental retardation   Bladder wall thickening 10/06/2011   BPH (benign prostatic hypertrophy)    Diabetes mellitus    Dialysis patient ([place])    Tuesday, Thursday and Saturday,    DVT of leg (deep venous thrombosis) ([place]) 12/25/2016   Edema     history of lower extremity edema   GERD (gastroesophageal reflux disease)    Heme positive stool    Hydronephrosis    Hyperkalemia    Hyperlipidemia    Hypernatremia    Hypertension    Hypothyroidism    Impaired speech    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[place])    MR (mental retardation)    Muscle weakness    Obstructive uropathy    Perinephric abscess 10/07/2011   Poor historian poor historian   Primary colorectal adenocarcinoma ([place]) 04/2017   S/p total  colectomy   Protein calorie malnutrition ([place])    Pyelonephritis    Renal failure (ARF), acute on chronic (HCC)    Renal insufficiency    chronic history   Sepsis ([place])    Smoking    Uremia    Urinary retention    UTI (lower urinary tract infection) 10/06/2011    Past Surgical History:  Procedure Laterality Date   A/V FISTULAGRAM N/A 08/13/2018   Procedure: A/V FISTULAGRAM - Right Upper;  Surgeon: Elam Dutch, MD;  Location: [place] [facility];  Service: Cardiovascular;  Laterality: N/A;   A/V FISTULAGRAM N/A 11/22/2018   Procedure: A/V FISTULAGRAM - Right Upper;  Surgeon: Waynetta Sandy, MD;  Location: [place] [facility];  Service: Cardiovascular;  Laterality: N/A;   AV FISTULA PLACEMENT Left 07/06/2015   Procedure:  INSERTION LEFT ARM ARTERIOVENOUS GORTEX GRAFT;  Surgeon: Angelia Mould, MD;  Location: [place];  Service: Vascular;  Laterality: Left;   AV FISTULA PLACEMENT Right 02/26/2016   Procedure: ARTERIOVENOUS (AV) FISTULA CREATION ;  Surgeon: Angelia Mould, MD;  Location: [place];  Service: Vascular;  Laterality: Right;   AV FISTULA PLACEMENT Right 11/25/2018   Procedure: INSERTION OF ARTERIOVENOUS (AV) ARTEGRAFT RIGHT UPPER ARM;  Surgeon: Waynetta Sandy, MD;  Location: [place];  Service: Vascular;  Laterality: Right;   AV FISTULA PLACEMENT Left 05/28/2021   Procedure: LEFT ARM ARTERIOVENOUS (  AV) FISTULA;  Surgeon: Rosetta Posner, MD;  [facility];  Service: Vascular;  Laterality: Left;   [place] Left 10/09/2015   Procedure: REMOVAL OF ARTERIOVENOUS GORETEX GRAFT ([place]) Evacuation of Lymphocele, Vein Patch angioplasty of brachial artery.;  Surgeon: Angelia Mould, MD;  Location: [place];  Service: Vascular;  Laterality: Left;   [place] Right 02/26/2016   Procedure: Right [place];  Surgeon: Angelia Mould, MD;  Location: [place];  Service: Vascular;  Laterality: Right;   [place] Left 07/30/2021   Procedure: LEFT ARM SECOND STAGE BASILIC VEIN TRANSPOSITION;  Surgeon: Rosetta Posner, MD;  Location: [facility];  Service: Vascular;  Laterality: Left;   CIRCUMCISION N/A 01/04/2014   Procedure: CIRCUMCISION ADULT (procedure #1);  Surgeon: Marissa Nestle, MD;  Location: [facility];  Service: Urology;  Laterality: N/A;   COLECTOMY N/A 05/04/2017   Procedure: TOTAL COLECTOMY;  Surgeon: Aviva Signs, MD;  Location: [facility];  Service: General;  Laterality: N/A;   COLONOSCOPY N/A 04/27/2017   Surgeon: Daneil Dolin, MD; annular mass in the ascending colon likely representing cancer biopsied, multiple 6-22 mm polyps removed, clean rectum.  Pathology with multiple tubular adenomas, high-grade dysplasia noted in ascending colon and splenic flexure.   CYSTOSCOPY W/ RETROGRADES Bilateral 06/29/2015   Procedure: CYSTOSCOPY, DILATION OF URETHRAL STRICTURE WITH BILATERAL RETROGRADE PYELOGRAM,SUPRAPUBIC TUBE CHANGE;  Surgeon: Festus Aloe, MD;  Location[facility];  Service: Urology;  Laterality: Bilateral;   CYSTOSCOPY WITH URETHRAL DILATATION N/A 12/29/2013   Procedure: CYSTOSCOPY WITH URETHRAL DILATATION;  Surgeon: Marissa Nestle, MD;  [facility];  Service: Urology;  Laterality: N/A;   ESOPHAGOGASTRODUODENOSCOPY N/A 04/27/2017   Procedure: ESOPHAGOGASTRODUODENOSCOPY (EGD);  Surgeon: Daneil Dolin, MD;  [facility];  Service: Endoscopy;  Laterality: N/A;   FLEXIBLE SIGMOIDOSCOPY N/A 12/02/2021   Procedure: FLEXIBLE SIGMOIDOSCOPY;  Surgeon: Eloise Harman, DO;  [facility];  Service: Endoscopy;  Laterality: N/A;  10:30am, dialysis patient   INSERTION OF DIALYSIS CATHETER Right 11/25/2018   Procedure: INSERTION OF DIALYSIS CATHETER RIGHT INTERNAL JUGULAR;  Surgeon: Waynetta Sandy, MD;  Location: [place];  Service: Vascular;  Laterality: Right;   IR AV DIALY SHUNT INTRO NEEDLE/INTRACATH INITIAL W/PTA/IMG RIGHT Right 09/07/2018   IR FLUORO  GUIDE CV LINE RIGHT  10/16/2020   IR REMOVAL TUN CV CATH W/O FL  01/12/2019   IR THROMBECTOMY AV FISTULA W/THROMBOLYSIS/PTA INC/SHUNT/IMG RIGHT Right 04/26/2018   IR US GUIDE VASC ACCESS RIGHT  04/26/2018   IR US GUIDE VASC ACCESS RIGHT  09/07/2018   IR US GUIDE VASC ACCESS RIGHT  10/16/2020   IR US GUIDE VASC ACCESS RIGHT  10/16/2020   ORIF FEMUR FRACTURE Right 11/22/2016   Procedure: OPEN REDUCTION INTERNAL FIXATION (ORIF) DISTAL FEMUR FRACTURE;  Surgeon: Rod Can, MD;  Location: [place];  Service: Orthopedics;  Laterality: Right;   PATCH ANGIOPLASTY Right 12/10/2017   Procedure: PATCH ANGIOPLASTY;  Surgeon: Angelia Mould, MD;  Location: [place];  Service: Vascular;  Laterality: Right;   PERIPHERAL VASCULAR BALLOON ANGIOPLASTY  08/13/2018   Procedure: PERIPHERAL VASCULAR BALLOON ANGIOPLASTY;  Surgeon: Elam Dutch, MD;  Location: [place] [facility];  Service: Cardiovascular;;  right AV fistula    PERIPHERAL VASCULAR BALLOON ANGIOPLASTY  11/22/2018   Procedure: PERIPHERAL VASCULAR BALLOON ANGIOPLASTY;  Surgeon: Waynetta Sandy, MD;  Location: [place] [facility];  Service: Cardiovascular;;  rt AV fistula   PERIPHERAL VASCULAR CATHETERIZATION N/A  10/08/2015   Procedure: A/V Shuntogram;  Surgeon: Angelia Mould, MD;  Location: [place] [facility];  Service: Cardiovascular;  Laterality: N/A;   REMOVAL OF A DIALYSIS CATHETER N/A 12/03/2021   Procedure: MINOR REMOVAL OF A TUNNELED DIALYSIS CATHETER;  Surgeon: Rosetta Posner, MD;  [facility];  Service: Vascular;  Laterality: N/A;   THROMBECTOMY W/ EMBOLECTOMY Right 12/10/2017   Procedure: THROMBECTOMY REVISION RIGHT ARM  ARTERIOVENOUS FISTULA;  Surgeon: Angelia Mould, MD;  Location[facility];  Service: Vascular;  Laterality: Right;   TRANSURETHRAL RESECTION OF PROSTATE N/A 01/04/2014   Procedure: TRANSURETHRAL RESECTION OF THE PROSTATE (TURP) (procedure #2);  Surgeon: Marissa Nestle, MD;  [facility];   Service: Urology;  Laterality: N/A;    No Known Allergies  Prior to Admission medications   Medication Sig Start Date End Date Taking? Authorizing Provider  aspirin EC 81 MG tablet Take 81 mg by mouth daily. 0900   Yes [provider]  atorvastatin (LIPITOR) 20 MG tablet Take 20 mg by mouth at bedtime. 2000   Yes [provider]  Insulin Glargine (BASAGLAR KWIKPEN) 100 UNIT/ML SOPN Inject 15 Units into the skin at bedtime. 2100 DO NOT HOLD this insulin without provider notification 07/17/20  Yes [provider]  levothyroxine (SYNTHROID) 125 MCG tablet Take 125 mcg by mouth at bedtime. 2100   Yes [provider]  midodrine (PROAMATINE) 10 MG tablet Take 10 mg by mouth See admin instructions. Send med with resident on  Tues/Thurs/Sat to dialysis. do not give [facility], give to resident to take with him. send with '5mg'$  to equal '15mg'$ . 6:00 06/01/19  Yes [provider]  midodrine (PROAMATINE) 5 MG tablet Take 5 mg by mouth See admin instructions. Send with resident on dialysis days.  Tues/Thurs/Sat  Take along with 10 mg to equal 15 mg 6:00 06/01/19  Yes [provider]  omega-3 acid ethyl esters (LOVAZA) 1 g capsule Take 2 g by mouth at bedtime. 2100 06/03/19  Yes [provider]  omeprazole (PRILOSEC) 40 MG capsule Take 40 mg by mouth daily. 0900 01/23/20  Yes [provider]  polyethylene glycol (MIRALAX / GLYCOLAX) packet Take 17 g by mouth daily as needed for mild constipation.  05/27/20  Yes [provider]  sevelamer carbonate (RENVELA) 800 MG tablet Take 2,400 mg by mouth in the morning and at bedtime. 07/11/20  Yes [provider]  sevelamer carbonate (RENVELA) 800 MG tablet Take 2,400 mg by mouth daily.   Yes [provider]  tamsulosin (FLOMAX) 0.4 MG CAPS capsule Take 0.4 mg by mouth daily at 6 PM. Give 30 minutes after a meal. Do not crush or chew   Yes [provider]  torsemide  (DEMADEX) 20 MG tablet Take 20 mg by mouth as directed. Sunday, Monday, Wednesday, and Friday   Yes [provider]  vitamin B-12 (CYANOCOBALAMIN) 1000 MCG tablet Take 1,000 mcg by mouth daily.   Yes [provider]  Insulin Pen Needle (BD AUTOSHIELD DUO) 30G X 5 MM MISC by Does not apply route. 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   [facility] resident [facility]    Social Determinants of Health   Financial Resource Strain: Low Risk  (02/18/2018)   Overall Financial Resource Strain (CARDIA)    Difficulty of Paying Living Expenses: Not hard at all  Food Insecurity: No Food Insecurity (02/18/2018)   Hunger Vital Sign    Worried About Running Out of Food in the Last Year: Never true    Ran Out of Food in the Last Year: Never true  Transportation Needs: No Transportation Needs (02/18/2018)   PRAPARE - Hydrologist (Medical): No    Lack of Transportation (Non-Medical): No  Physical Activity: Unknown (09/15/2019)   Exercise Vital Sign    Days of Exercise per Week: Not on file    Minutes of Exercise per Session: 0 min  Stress: No Stress Concern Present (02/18/2018)   [place]    Feeling of Stress : Not at all  Social Connections:  Unknown (09/15/2019)   Social Connection and Isolation Panel [NHANES]    Frequency of Communication with Friends and Family: Not on file    Frequency of Social Gatherings with Friends and Family: Never    Attends Religious Services: Not on Diplomatic Services operational officer of Clubs or Organizations: Not on file    Attends Archivist Meetings: Not on file    Marital Status: Not on file  Intimate Partner Violence: Not At Risk (02/18/2018)   Humiliation, Afraid, Rape, and Kick questionnaire    Fear of Current or Ex-Partner: No    Emotionally Abused: No    Physically Abused: No    Sexually Abused: No     Family History  Problem Relation Age of Onset   Cancer Mother    Colon cancer Neg Hx     ROS: Otherwise negative unless mentioned in HPI  Physical Examination  Vitals:   10/02/22 1230 10/02/22 1245  BP: (!) 170/90   Pulse: 78 69  Resp: 20 12  Temp:    SpO2: 100% 100%   Body mass index is 31.29 kg/m.  General:  WDWN in NAD Gait: Not observed HENT: WNL, normocephalic Pulmonary: normal non-labored breathing Cardiac: regular rate and rhythm Abdomen: soft, NT/ND Vascular Exam/Pulses: 2+  bilateral radial pulses, hands warm and well perfused Extremities: without ischemic changes, without Gangrene , without cellulitis; without open wounds;  Musculoskeletal: no muscle wasting or atrophy  Neurologic: A&O X 3;  No focal weakness or paresthesias are detected; speech is fluent/normal Psychiatric:  The pt has normal affect.  CBC    Component Value Date/Time   WBC 4.0 10/01/2022 1611   RBC 3.61 (L) 10/01/2022 1611   HGB 12.5 (L) 10/01/2022 1611   HCT 39.0 10/01/2022 1611   PLT 97 (L) 10/01/2022 1611   MCV 108.0 (H) 10/01/2022 1611   MCH 34.6 (H) 10/01/2022 1611   MCHC 32.1 10/01/2022 1611   RDW 15.5 10/01/2022 1611   LYMPHSABS 1.0 10/17/2020 0928   MONOABS 0.8 10/17/2020 0928   EOSABS 0.1 10/17/2020 5643  BASOSABS 0.0 10/17/2020 0928    BMET    Component Value  Date/Time   NA 139 10/01/2022 1611   K 5.3 (H) 10/01/2022 1611   CL 103 10/01/2022 1611   CO2 22 10/01/2022 1611   GLUCOSE 97 10/01/2022 1611   BUN 61 (H) 10/01/2022 1611   CREATININE 21.78 (H) 10/01/2022 1611   CALCIUM 9.3 10/01/2022 1611   CALCIUM 8.3 (L) 03/03/2015 0622   GFRNONAA 2 (L) 10/01/2022 1611   GFRAA 5 (L) 04/09/2020 0607    COAGS: Lab Results  Component Value Date   INR 1.04 04/26/2018   INR 1.22 05/01/2017   INR 1.09 04/30/2017     Non-Invasive Vascular Imaging:   none  Statin:  Yes.   Beta Blocker:  No. Aspirin:  Yes.   ACEI:  No. ARB:  No. CCB use:  No Other antiplatelets/anticoagulants:  No.    ASSESSMENT/PLAN: This is a 76 y.o. male with ESRD with malfunctioning left BC AV fistula. Left Brachiocephalic AV fistula felt to be non salvageable. He just underwent Fistulogram by [facility] on 11/28. Do not have this report but per Nephrology note they were not able to perform successful intervention and recommended new access.  The fistula is currently still functioning on exam. It is pulsatile and aneurysmal. Due to recurrent issues with cannulation of the fistula would recommend new access. IR to place [facility] tomorrow. Patient will need a new upper arm access likely a graft. Will order a bilateral upper extremity vein mapping as last was performed in July of 2022. Will plan to arrange new access likely on Wednesday of next week. Patient certainly will not need to stay in house until surgery next week. On call vascular surgeon, Dr. Stanford Breed will see patient this afternoon to discuss the plans further with him.    Karoline Caldwell PA-C [facility] [phone] 10/02/2022  1:07 PM  VASCULAR STAFF ADDENDUM: I have independently interviewed and examined the patient. I agree with the above.  The left arm basilic vein AVF feels patent, but does have a weak, pulsatile thrill. Centrally, there is a weak, but more normal thrill. I agree with placement  of TDC for reliable HD access going forward. Revision of the fistula with aneurysmorrhaphy and angioplasty of the stenotic segment could be successful.  Will plan to do this early next week.   Yevonne Aline. Stanford Breed, MD [facility] Phone Number: [phone] 10/02/2022 5:58 PM

## 2022-10-02 NOTE — Consult Note (Signed)
Chief Complaint: Patient was seen in consultation today for  Chief Complaint  Patient presents with   Vascular Access Problem   at the request of nephrology  Referring Physician(s): Stephania Fragmin, PA-C  Supervising Physician: Michaelle Birks  Patient Status: [facility] - ED  History of Present Illness: Zachary [name] is a 76 y.o. male with ESRD on HD TTS, HTN, T2DM, intellectual disability, Hx colon cancer s/p total colectomy with colorectal anastomosis 2018 who was transferred from [facility] with con.cern for hyperkalemia and dialysis access dysfunction.  His legal guardian is Doretha Imus with [facility] [employer] [facility]  Has LUE AVF which had reported prolonged bleeding after a dialysis session.  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 It was felt that this AVF was not amenable to intervention, and the plan was to be referred to vascular surgery for [facility] and new access placement in the near future.   He was successfully dialyzed after this on 11/30 and 12/2 without issue. On 12/5, he presented to dialysis and nurse felt that the AVF was "clotted" - he was unable to be dialyzed on that day.   He was transferred from [facility] and seen in the ED.  IR consulted for placement of TDC at this time.  Patient denies any pain or SOB at this time.  Reports using walker for ambulation.  Past Medical History:  Diagnosis Date   Abnormal CT scan, kidney 10/06/2011   Acute pyelonephritis 10/07/2011   Anemia    normocytic   Anxiety    mental retardation   Bladder wall thickening 10/06/2011   BPH (benign prostatic hypertrophy)    Diabetes mellitus    Dialysis patient ([place])    Tuesday, Thursday and Saturday,    DVT of leg (deep venous thrombosis) ([place]) 12/25/2016   Edema     history of lower extremity edema   GERD (gastroesophageal  reflux disease)    Heme positive stool    Hydronephrosis    Hyperkalemia    Hyperlipidemia    Hypernatremia    Hypertension    Hypothyroidism    Impaired speech    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[place])    MR (mental retardation)    Muscle weakness    Obstructive uropathy    Perinephric abscess 10/07/2011   Poor historian poor historian   Primary colorectal adenocarcinoma ([place]) 04/2017   S/p total colectomy   Protein calorie malnutrition ([place])    Pyelonephritis    Renal failure (ARF), acute on chronic (HCC)    Renal insufficiency    chronic history   Sepsis ([place])    Smoking    Uremia    Urinary retention    UTI (lower urinary tract infection) 10/06/2011    Past Surgical History:  Procedure Laterality Date   A/V FISTULAGRAM N/A 08/13/2018   Procedure: A/V FISTULAGRAM - Right Upper;  Surgeon: Elam Dutch, MD;  Location: [place] [facility];  Service: Cardiovascular;  Laterality: N/A;   A/V FISTULAGRAM N/A 11/22/2018   Procedure: A/V FISTULAGRAM - Right Upper;  Surgeon: Waynetta Sandy, MD;  Location: [place] [facility];  Service: Cardiovascular;  Laterality: N/A;   AV FISTULA PLACEMENT Left 07/06/2015   Procedure:  INSERTION LEFT ARM ARTERIOVENOUS GORTEX GRAFT;  Surgeon: Angelia Mould, MD;  Location: [place];  Service: Vascular;  Laterality: Left;   AV FISTULA PLACEMENT Right 02/26/2016   Procedure: ARTERIOVENOUS (AV) FISTULA CREATION ;  Surgeon: Angelia Mould, MD;  Location: [place];  Service: Vascular;  Laterality: Right;   AV FISTULA PLACEMENT Right 11/25/2018   Procedure: INSERTION OF ARTERIOVENOUS (AV) ARTEGRAFT RIGHT UPPER ARM;  Surgeon: Waynetta Sandy, MD;  Location: [place];  Service: Vascular;  Laterality: Right;   AV FISTULA PLACEMENT Left 05/28/2021   Procedure: LEFT ARM ARTERIOVENOUS (AV) FISTULA;  Surgeon: Rosetta Posner, MD;  [facility]: [facility];  Service: Vascular;  Laterality: Left;   [place] REMOVAL Left 10/09/2015   Procedure:  REMOVAL OF ARTERIOVENOUS GORETEX GRAFT ([place]) Evacuation of Lymphocele, Vein Patch angioplasty of brachial artery.;  Surgeon: Angelia Mould, MD;  Location: [place];  Service: Vascular;  Laterality: Left;   [place] Right 02/26/2016   Procedure: Right [place];  Surgeon: Angelia Mould, MD;  Location: [place];  Service: Vascular;  Laterality: Right;   [place] Left 07/30/2021   Procedure: LEFT ARM SECOND STAGE BASILIC VEIN TRANSPOSITION;  Surgeon: Rosetta Posner, MD;  Location: [facility];  Service: Vascular;  Laterality: Left;   CIRCUMCISION N/A 01/04/2014   Procedure: CIRCUMCISION ADULT (procedure #1);  Surgeon: Marissa Nestle, MD;  Location: [facility];  Service: Urology;  Laterality: N/A;   COLECTOMY N/A 05/04/2017   Procedure: TOTAL COLECTOMY;  Surgeon: Aviva Signs, MD;  [facility]: [facility];  Service: General;  Laterality: N/A;   COLONOSCOPY N/A 04/27/2017   Surgeon: Daneil Dolin, MD; annular mass in the ascending colon likely representing cancer biopsied, multiple 6-22 mm polyps removed, clean rectum.  Pathology with multiple tubular adenomas, high-grade dysplasia noted in ascending colon and splenic flexure.   CYSTOSCOPY W/ RETROGRADES Bilateral 06/29/2015   Procedure: CYSTOSCOPY, DILATION OF URETHRAL STRICTURE WITH BILATERAL RETROGRADE PYELOGRAM,SUPRAPUBIC TUBE CHANGE;  Surgeon: Festus Aloe, MD;  Location: [facility];  Service: Urology;  Laterality: Bilateral;   CYSTOSCOPY WITH URETHRAL DILATATION N/A 12/29/2013   Procedure: CYSTOSCOPY WITH URETHRAL DILATATION;  Surgeon: Marissa Nestle, MD;  [facility];  Service: Urology;  Laterality: N/A;   ESOPHAGOGASTRODUODENOSCOPY N/A 04/27/2017   Procedure: ESOPHAGOGASTRODUODENOSCOPY (EGD);  Surgeon: Daneil Dolin, MD;  [facility];  Service: Endoscopy;  Laterality: N/A;   FLEXIBLE SIGMOIDOSCOPY N/A 12/02/2021   Procedure: FLEXIBLE SIGMOIDOSCOPY;  Surgeon: Eloise Harman, DO;  [facility];  Service: Endoscopy;  Laterality: N/A;  10:30am, dialysis patient   INSERTION OF DIALYSIS CATHETER Right 11/25/2018   Procedure: INSERTION OF DIALYSIS CATHETER RIGHT INTERNAL JUGULAR;  Surgeon: Waynetta Sandy, MD;  Location: [place];  Service: Vascular;  Laterality: Right;   IR AV DIALY SHUNT INTRO NEEDLE/INTRACATH INITIAL W/PTA/IMG RIGHT Right 09/07/2018   IR FLUORO GUIDE CV LINE RIGHT  10/16/2020   IR REMOVAL TUN CV CATH W/O FL  01/12/2019   IR THROMBECTOMY AV FISTULA W/THROMBOLYSIS/PTA INC/SHUNT/IMG RIGHT Right 04/26/2018   IR US GUIDE VASC ACCESS RIGHT  04/26/2018   IR US GUIDE VASC ACCESS RIGHT  09/07/2018   IR US GUIDE VASC ACCESS RIGHT  10/16/2020   IR US GUIDE VASC ACCESS RIGHT  10/16/2020   ORIF FEMUR FRACTURE Right 11/22/2016   Procedure: OPEN REDUCTION INTERNAL FIXATION (ORIF) DISTAL FEMUR FRACTURE;  Surgeon: Rod Can, MD;  Location: [place];  Service: Orthopedics;  Laterality: Right;   PATCH ANGIOPLASTY Right 12/10/2017   Procedure: PATCH ANGIOPLASTY;  Surgeon: Angelia Mould, MD;  Location: [place];  Service: Vascular;  Laterality: Right;   PERIPHERAL VASCULAR BALLOON ANGIOPLASTY  08/13/2018   Procedure: PERIPHERAL VASCULAR BALLOON ANGIOPLASTY;  Surgeon: Elam Dutch, MD;  Location: [place] [facility];  Service: Cardiovascular;;  right AV fistula    PERIPHERAL VASCULAR BALLOON ANGIOPLASTY  11/22/2018   Procedure: PERIPHERAL VASCULAR BALLOON ANGIOPLASTY;  Surgeon: Waynetta Sandy, MD;  Location: [place] [facility];  Service: Cardiovascular;;  rt AV fistula   PERIPHERAL VASCULAR CATHETERIZATION N/A 10/08/2015   Procedure: A/V Shuntogram;  Surgeon: Angelia Mould, MD;  Location: [place] [facility];  Service: Cardiovascular;  Laterality: N/A;   REMOVAL OF A DIALYSIS CATHETER N/A 12/03/2021   Procedure: MINOR REMOVAL OF A TUNNELED DIALYSIS CATHETER;  Surgeon: Rosetta Posner, MD;  [facility];  Service:  Vascular;  Laterality: N/A;   THROMBECTOMY W/ EMBOLECTOMY Right 12/10/2017   Procedure: THROMBECTOMY REVISION RIGHT ARM  ARTERIOVENOUS FISTULA;  Surgeon: Angelia Mould, MD;  Location: [facility];  Service: Vascular;  Laterality: Right;   TRANSURETHRAL RESECTION OF PROSTATE N/A 01/04/2014   Procedure: TRANSURETHRAL RESECTION OF THE PROSTATE (TURP) (procedure #2);  Surgeon: Marissa Nestle, MD;  [facility];  Service: Urology;  Laterality: N/A;    Allergies: Patient has no known allergies.  Medications: Prior to Admission medications   Medication Sig Start Date End Date Taking? Authorizing Provider  aspirin EC 81 MG tablet Take 81 mg by mouth daily. 0900   Yes [provider]  atorvastatin (LIPITOR) 20 MG tablet Take 20 mg by mouth at bedtime. 2000   Yes [provider]  Insulin Glargine (BASAGLAR KWIKPEN) 100 UNIT/ML SOPN Inject 15 Units into the skin at bedtime. 2100 DO NOT HOLD this insulin without provider notification 07/17/20  Yes [provider]  levothyroxine (SYNTHROID) 125 MCG tablet Take 125 mcg by mouth at bedtime. 2100   Yes [provider]  midodrine (PROAMATINE) 10 MG tablet Take 10 mg by mouth See admin instructions. Send med with resident on  Tues/Thurs/Sat to dialysis. do not give at [facility], give to resident to take with him. send with '5mg'$  to equal '15mg'$ . 6:00 06/01/19  Yes [provider]  midodrine (PROAMATINE) 5 MG tablet Take 5 mg by mouth See admin instructions. Send with resident on dialysis days.  Tues/Thurs/Sat  Take along with 10 mg to equal 15 mg 6:00 06/01/19  Yes [provider]  omega-3 acid ethyl esters (LOVAZA) 1 g capsule Take 2 g by mouth at bedtime. 2100 06/03/19  Yes [provider]  omeprazole (PRILOSEC) 40 MG capsule Take 40 mg by mouth daily. 0900 01/23/20  Yes [provider]  polyethylene glycol (MIRALAX / GLYCOLAX) packet Take 17 g by mouth daily as needed for mild  constipation.  05/27/20  Yes [provider]  sevelamer carbonate (RENVELA) 800 MG tablet Take 2,400 mg by mouth in the morning and at bedtime. 07/11/20  Yes [provider]  sevelamer carbonate (RENVELA) 800 MG tablet Take 2,400 mg by mouth daily.   Yes [provider]  tamsulosin (FLOMAX) 0.4 MG CAPS capsule Take 0.4 mg by mouth daily at 6 PM. Give 30 minutes after a meal. Do not crush or chew   Yes [provider]  torsemide (DEMADEX) 20 MG tablet Take 20 mg by mouth as directed. Sunday, Monday, Wednesday, and Friday   Yes [provider]  vitamin B-12 (CYANOCOBALAMIN) 1000 MCG tablet Take 1,000 mcg by mouth daily.   Yes [provider]  Insulin Pen Needle (BD AUTOSHIELD DUO) 30G X 5 MM MISC by Does not apply route. 3/16"    [provider]  NON FORMULARY Diet: NAS, Cons CHO    [provider]     Family History  Problem Relation Age of Onset   Cancer Mother    Colon cancer Neg Hx     Social History   Socioeconomic History   Marital status: Single    Spouse name: Not on file   Number of children: Not on file   Years of education: Not on file   Highest education level: Not on file  Occupational History   Occupation: retired   Tobacco Use   Smoking status: Never   Smokeless tobacco: Never  Vaping Use   Vaping Use: Never used  Substance and Sexual Activity   Alcohol use: No   Drug use: No   Sexual activity: Not Currently  Other Topics Concern   Not on file  Social History Narrative   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   [facility] resident of [facility]    Social Determinants of Health   Financial Resource Strain: Low Risk  (02/18/2018)   Overall Financial Resource Strain (CARDIA)    Difficulty of Paying Living Expenses: Not hard at all  Food Insecurity: No Food Insecurity (02/18/2018)   Hunger Vital Sign    Worried About Running Out of Food in the Last Year: Never true     Ran Out of Food in the Last Year: Never true  Transportation Needs: No Transportation Needs (02/18/2018)   PRAPARE - Hydrologist (Medical): No    Lack of Transportation (Non-Medical): No  Physical Activity: Unknown (09/15/2019)   Exercise Vital Sign    Days of Exercise per Week: Not on file    Minutes of Exercise per Session: 0 min  Stress: No Stress Concern Present (02/18/2018)   [place]    Feeling of Stress : Not at all  Social Connections: Unknown (09/15/2019)   Social Connection and Isolation Panel [NHANES]    Frequency of Communication with Friends and Family: Not on file    Frequency of Social Gatherings with Friends and Family: Never    Attends Religious Services: Not on Advertising copywriter or Organizations: Not on file    Attends Archivist Meetings: Not on file    Marital Status: Not on file    Review of Systems: Difficult to obtain secondary to intellectual disability   Vital Signs: BP (!) 170/90   Pulse 69   Temp 98.1 F (36.7 C) (Oral)   Resp 12   Ht '5\' 8"'$  (1.727 m)   Wt 205 lb 12.8 oz (93.3 kg)   SpO2 100%   BMI 31.29 kg/m   Physical Exam Constitutional:      General: He is not in acute distress.    Appearance: He is ill-appearing.  HENT:     Mouth/Throat:     Mouth: Mucous membranes are moist.     Pharynx: Oropharynx is clear.  Eyes:     Conjunctiva/sclera: Conjunctivae normal.  Cardiovascular:     Rate and Rhythm: Normal rate.     Pulses: Normal pulses.  Pulmonary:     Effort: Pulmonary effort is normal. No respiratory distress.     Breath sounds: Normal breath sounds.  Abdominal:     General: Abdomen is flat. There is no  distension.     Palpations: Abdomen is soft.  Skin:    General: Skin is warm and dry.  Neurological:     Mental Status: He is alert.     Comments: Oriented to self and surroundings.  Expresses awareness of  fistula problems.  Understands he is getting a TDC in his neck.  Not oriented to birthday/age, family, day/month/year     Imaging: DG Chest Port 1 View  Result Date: 10/01/2022 CLINICAL DATA:  Missed dialysis. EXAM: PORTABLE CHEST 1 VIEW COMPARISON:  CXR 09/16/19 FINDINGS: No pleural effusion. No pneumothorax. Enlarged cardiac and mediastinal contours, likely unchanged from prior exam when accounting for differences in lung volumes. No focal airspace opacity. No definite evidence of overt pulmonary edema. No displaced rib fractures. Visualized upper abdomen is unremarkable. IMPRESSION: Enlarged cardiac and mediastinal contours, likely unchanged from prior exam when accounting for differences in lung volumes. There is mild pulmonary venous congestion without evidence of overt pulmonary edema. Electronically Signed   By: Marin Roberts M.D.   On: 10/01/2022 16:45    Labs:  CBC: Recent Labs    11/07/21 0415 11/22/21 0400 12/02/21 1006 01/28/22 0400 09/29/22 0818 10/01/22 1611  WBC 4.5  --   --  4.8 4.3 4.0  HGB 9.5*   < > 10.5* 8.7* 12.9* 12.5*  HCT 29.5*   < > 31.0* 26.0* 41.1 39.0  PLT 107*  --   --  113* 108* 97*   < > = values in this interval not displayed.    BMP: Recent Labs    11/07/21 0415 12/02/21 1006 01/28/22 0400 09/29/22 0818 10/01/22 1611  NA 138 140 141 139 139  K 4.5 3.9 3.8 4.7 5.3*  CL 105 101 105 106 103  CO2 26  --  25 21* 22  GLUCOSE 129* 85 71 112* 97  BUN 28* 25* 40* 44* 61*  CALCIUM 8.6*  --  8.8* 9.3 9.3  CREATININE 9.01* 9.80* 11.35* 16.87* 21.78*  GFRNONAA 6*  --  4* 3* 2*    LIVER FUNCTION TESTS: Recent Labs    11/07/21 0415 09/29/22 0818  BILITOT 0.6 0.6  AST 17 14*  ALT 20 12  ALKPHOS 78 54  PROT 6.2* 7.0  ALBUMIN 3.1* 3.5    Assessment and Plan:  Malfunctioning AVF --unable to access at most recent dialysis session, reportedly stenosed on outside study with no intervention done at that time.  Recommendation was to abandon this  site and set up for [facility]. --Nephrology asking for [facility] at this time.  Case reviewed and approved by Dr. Laurence Ferrari --Will tentatively plan on placement tomorrow, 12/8, pending IR schedule.  Abx ordered, NPO at midnight.  Risks and benefits discussed with the [facility] representative, Patriciaann Clan, including, but not limited to bleeding, infection, vascular injury, pneumothorax which may require chest tube placement, air embolism or even death  All questions were answered, patient and guardian are agreeable to proceed. Consent signed and in IR control room.   Thank you for this interesting consult.  I greatly enjoyed meeting AADI BORDNER and look forward to participating in their care.  A copy of this report was sent to the requesting provider on this date.  Electronically Signed: Pasty Spillers, PA 10/02/2022, 2:07 PM   I spent a total of 40 Minutes in face to face in clinical consultation, greater than 50% of which was counseling/coordinating care for dialysis access.

## 2022-10-02 NOTE — ED Notes (Signed)
WHEELED PATIENT TO THE BATHROOM PATIENT DID WELL

## 2022-10-03 ENCOUNTER — Inpatient Hospital Stay (HOSPITAL_COMMUNITY): Payer: Medicare Other

## 2022-10-03 ENCOUNTER — Encounter (HOSPITAL_COMMUNITY): Payer: Medicare Other

## 2022-10-03 DIAGNOSIS — N401 Enlarged prostate with lower urinary tract symptoms: Secondary | ICD-10-CM | POA: Diagnosis not present

## 2022-10-03 DIAGNOSIS — T82590A Other mechanical complication of surgically created arteriovenous fistula, initial encounter: Secondary | ICD-10-CM | POA: Diagnosis not present

## 2022-10-03 DIAGNOSIS — E875 Hyperkalemia: Secondary | ICD-10-CM | POA: Diagnosis not present

## 2022-10-03 DIAGNOSIS — N186 End stage renal disease: Secondary | ICD-10-CM | POA: Diagnosis not present

## 2022-10-03 HISTORY — PX: IR US GUIDE VASC ACCESS RIGHT: IMG2390

## 2022-10-03 HISTORY — PX: IR FLUORO GUIDE CV LINE RIGHT: IMG2283

## 2022-10-03 LAB — GLUCOSE, CAPILLARY
Glucose-Capillary: 87 mg/dL (ref 70–99)
Glucose-Capillary: 98 mg/dL (ref 70–99)
Glucose-Capillary: 87 mg/dL (ref 70–99)

## 2022-10-03 LAB — RENAL FUNCTION PANEL
Albumin: 3.1 g/dL — ABNORMAL LOW (ref 3.5–5.0)
Anion gap: 14 (ref 5–15)
BUN: 73 mg/dL — ABNORMAL HIGH (ref 8–23)
CO2: 16 mmol/L — ABNORMAL LOW (ref 22–32)
Calcium: 8.9 mg/dL (ref 8.9–10.3)
Chloride: 107 mmol/L (ref 98–111)
Creatinine, Ser: 24.77 mg/dL — ABNORMAL HIGH (ref 0.61–1.24)
GFR, Estimated: 2 mL/min — ABNORMAL LOW (ref >60)
Glucose, Bld: 99 mg/dL (ref 70–99)
Phosphorus: 8.4 mg/dL — ABNORMAL HIGH (ref 2.5–4.6)
Potassium: 6.4 mmol/L (ref 3.5–5.1)
Sodium: 137 mmol/L (ref 135–145)

## 2022-10-03 LAB — HEPATITIS B SURFACE ANTIBODY, QUANTITATIVE: Hep B S AB Quant (Post): 387.6 m[IU]/mL (ref >9.9)

## 2022-10-03 LAB — CBC
HCT: 37.5 % — ABNORMAL LOW (ref 39.0–52.0)
Hemoglobin: 12.4 g/dL — ABNORMAL LOW (ref 13.0–17.0)
MCH: 34.9 pg — ABNORMAL HIGH (ref 26.0–34.0)
MCHC: 33.1 g/dL (ref 30.0–36.0)
MCV: 105.6 fL — ABNORMAL HIGH (ref 80.0–100.0)
Platelets: 81 10*3/uL — ABNORMAL LOW (ref 150–400)
RBC: 3.55 MIL/uL — ABNORMAL LOW (ref 4.22–5.81)
RDW: 14.8 % (ref 11.5–15.5)
WBC: 3 10*3/uL — ABNORMAL LOW (ref 4.0–10.5)
nRBC: 0 % (ref 0.0–0.2)

## 2022-10-03 MED ORDER — INSULIN ASPART 100 UNIT/ML IV SOLN
8.0000 [IU] | Freq: Once | INTRAVENOUS | Status: AC
  Administered 2022-10-03: 8 [IU] via INTRAVENOUS

## 2022-10-03 MED ORDER — MIDODRINE HCL 5 MG PO TABS
15.0000 mg | ORAL_TABLET | Freq: Once | ORAL | Status: DC
  Filled 2022-10-03: qty 3

## 2022-10-03 MED ORDER — SODIUM CHLORIDE 0.9 % IV SOLN
INTRAVENOUS | Status: AC | PRN
  Administered 2022-10-03: 10 mL/h via INTRAVENOUS

## 2022-10-03 MED ORDER — INSULIN ASPART 100 UNIT/ML IJ SOLN: INTRAMUSCULAR | 11 refills | Status: DC

## 2022-10-03 MED ORDER — HEPARIN SODIUM (PORCINE) 1000 UNIT/ML IJ SOLN
INTRAMUSCULAR | Status: AC
  Filled 2022-10-03: qty 4

## 2022-10-03 MED ORDER — FENTANYL CITRATE (PF) 100 MCG/2ML IJ SOLN
INTRAMUSCULAR | Status: AC | PRN
  Administered 2022-10-03: 50 ug via INTRAVENOUS

## 2022-10-03 MED ORDER — SEVELAMER CARBONATE 800 MG PO TABS: 2400.0000 mg | ORAL_TABLET | Freq: Three times a day (TID) | ORAL | Status: AC

## 2022-10-03 MED ORDER — MIDAZOLAM HCL 2 MG/2ML IJ SOLN
INTRAMUSCULAR | Status: AC | PRN
  Administered 2022-10-03: 1 mg via INTRAVENOUS

## 2022-10-03 MED ORDER — SODIUM ZIRCONIUM CYCLOSILICATE 10 G PO PACK
10.0000 g | PACK | Freq: Three times a day (TID) | ORAL | Status: DC
  Administered 2022-10-03 (×2): 10 g via ORAL
  Filled 2022-10-03 (×2): qty 1

## 2022-10-03 MED ORDER — ONDANSETRON HCL 4 MG/2ML IJ SOLN
INTRAMUSCULAR | Status: AC | PRN
  Administered 2022-10-03: 4 mg via INTRAVENOUS

## 2022-10-03 MED ORDER — FENTANYL CITRATE (PF) 100 MCG/2ML IJ SOLN
INTRAMUSCULAR | Status: AC
  Filled 2022-10-03: qty 2

## 2022-10-03 MED ORDER — LIDOCAINE-EPINEPHRINE 1 %-1:100000 IJ SOLN
INTRAMUSCULAR | Status: AC
  Administered 2022-10-03: 15 mL
  Filled 2022-10-03: qty 1

## 2022-10-03 MED ORDER — BASAGLAR KWIKPEN 100 UNIT/ML ~~LOC~~ SOPN: 8.0000 [IU] | PEN_INJECTOR | Freq: Every day | SUBCUTANEOUS | Status: DC

## 2022-10-03 MED ORDER — MIDAZOLAM HCL 2 MG/2ML IJ SOLN
INTRAMUSCULAR | Status: AC
  Filled 2022-10-03: qty 2

## 2022-10-03 MED ORDER — HEPARIN SODIUM (PORCINE) 5000 UNIT/ML IJ SOLN: 5000.0000 [IU] | Freq: Three times a day (TID) | INTRAMUSCULAR | Status: DC

## 2022-10-03 MED ORDER — HEPARIN SODIUM (PORCINE) 1000 UNIT/ML IJ SOLN
INTRAMUSCULAR | Status: AC
  Administered 2022-10-03: 3200 mL
  Filled 2022-10-03: qty 10

## 2022-10-03 MED ORDER — DEXTROSE 50 % IV SOLN
1.0000 | Freq: Once | INTRAVENOUS | Status: AC
  Administered 2022-10-03: 50 mL via INTRAVENOUS
  Filled 2022-10-03: qty 50

## 2022-10-03 MED ORDER — TORSEMIDE 20 MG PO TABS: 20.0000 mg | ORAL_TABLET | ORAL | Status: DC

## 2022-10-03 MED ORDER — GELATIN ABSORBABLE 12-7 MM EX MISC
CUTANEOUS | Status: AC
  Filled 2022-10-03: qty 1

## 2022-10-03 MED ORDER — ONDANSETRON HCL 4 MG/2ML IJ SOLN
INTRAMUSCULAR | Status: AC
  Filled 2022-10-03: qty 2

## 2022-10-03 MED ORDER — CEFAZOLIN SODIUM-DEXTROSE 2-4 GM/100ML-% IV SOLN
INTRAVENOUS | Status: AC
  Administered 2022-10-03: 2 g via INTRAVENOUS
  Filled 2022-10-03: qty 100

## 2022-10-03 MED ORDER — HEPARIN SODIUM (PORCINE) 1000 UNIT/ML IJ SOLN
INTRAMUSCULAR | Status: AC
  Filled 2022-10-03: qty 2

## 2022-10-03 NOTE — Progress Notes (Signed)
PT Cancellation Note  Patient Details Name: Zachary [name] MRN: [id number] DOB: 1946/05/24   Cancelled Treatment:    Reason Eval/Treat Not Completed: Patient not medically ready  Pt just back from interventional radiology to get fistula repaired. Nurse stated pt potassium 6+ and has not get been corrected. Asked to hold therapy as pt with run of SVT in procedure. Will attempt back as able.   Reports scheduled to have HD at some point today.   Candie Mile, PT, DPT Physical Therapist Acute Rehabilitation Services [facility]  10/03/2022, 10:33 AM

## 2022-10-03 NOTE — Discharge Summary (Signed)
PATIENT DETAILS Name: Zachary [name] Age: 76 y.o. Sex: male Date of Birth: 1946-01-08 MRN: [id number]. Admitting Physician: Albertine Patricia, MD HER:DEYCX, Phylis Bougie, NP  Admit Date: 10/01/2022 Discharge date: 10/03/2022  Recommendations for Outpatient Follow-up:  Follow up with PCP in 1-2 weeks Please obtain CMP/CBC in one week Please ensure  follow-up with nephrology and vascular surgery  Admitted From:  SNF  Disposition: Skilled nursing facility   Discharge Condition: fair  CODE STATUS:   Code Status: Full Code   Diet recommendation:  Diet Order             Diet renal/carb modified with fluid restriction Diet-HS Snack? Nothing; Fluid restriction: 1200 mL Fluid; Room service appropriate? Yes; Fluid consistency: Thin  Diet effective now           Diet - low sodium heart healthy           Diet Carb Modified                    Brief Summary: Patient is a 76 y.o.  male with history of ESRD on HD-sent from SNF to [facility] for malfunctioning AVF, subsequently referred to [facility] for IR eval for declot procedure.   Significant events: 12/6>> admit to [facility]-malfunctioning AVF.   Significant studies: 12/6>> CXR: No PNA.   Significant microbiology data: None   Procedures: 12/8>> TDC placement by IR   Consults: IR Nephrology  Brief Hospital Course: Malfunctioning AV fistula Felt to be unsalvageable TDC placed 12/8 Discussed with nephrologist-Dr. Reesa Chew surgery follow-up will be done in the outpatient setting for permanent access.   ESRD S/p [facility] placement 12/7-will get dialysis today prior to discharge, and then will resume outpatient HD as previous on TTS schedule. Nephrology followed during this hospitalization   Hyperkalemia Refused Lokelma yesterday evening and this morning Given insulin/D50 Scheduled for dialysis today   DM-2 (A1c 5.0 on 12/4) CBG stable with SSI and 8 units of Semglee.  Orthostatic hypotension Continue with  midodrine   Hypothyroidism Synthroid Most recent TSH minimally elevated-follow-up with PCP for repeat TSH in the next few weeks   Thrombocytopenia Mild-seems to be a chronic issue. Follow CBC as outpatient   GERD PPI   BPH Flomax   Intellectual disability Seems to be at baseline   Obesity: Estimated body mass index is 30.81 kg/m as calculated from the following:   Height as of this encounter: '5\' 8"'$  (1.727 m).   Weight as of this encounter: 91.9 kg.    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 (HCC)   End stage renal disease on dialysis due to type 2 diabetes mellitus (HCC)   Orthostatic hypotension   GERD without esophagitis   Dyslipidemia associated with type 2 diabetes mellitus ([place])   Discharge Instructions:  Activity:  As tolerated with Full fall precautions use walker/cane & assistance as needed   Discharge Instructions     Diet - low sodium heart healthy   Complete by: As directed    Diet Carb Modified   Complete by: As directed    Discharge instructions   Complete by: As directed    Follow with Primary MD  Gerlene Fee, NP in 1-2 weeks  Please get  a complete blood count and chemistry panel checked by your Primary MD at your next visit, and again as instructed by your Primary MD.  Get Medicines reviewed and adjusted: Please take all your medications with you for your next visit with your Primary MD  Laboratory/radiological data: Please request your Primary MD to go over all hospital tests and procedure/radiological results at the follow up, please ask your Primary MD to get all Hospital records sent to his/her office.  In some cases, they will be blood  work, cultures and biopsy results pending at the time of your discharge. Please request that your primary care M.D. follows up on these results.  Also Note the following: If you experience worsening of your admission symptoms, develop shortness of breath, life threatening emergency, suicidal or homicidal thoughts you must seek medical attention immediately by calling 911 or calling your MD immediately  if symptoms less severe.  You must read complete instructions/literature along with all the possible adverse reactions/side effects for all the Medicines you take and that have been prescribed to you. Take any new Medicines after you have completely understood and accpet all the possible adverse reactions/side effects.   Do not drive when taking Pain medications or sleeping medications (Benzodaizepines)  Do not take more than prescribed Pain, Sleep and Anxiety Medications. It is not advisable to combine anxiety,sleep and pain medications without talking with your primary care practitioner  Special Instructions: If you have smoked or chewed Tobacco  in the last 2 yrs please stop smoking, stop any regular Alcohol  and or any Recreational drug use.  Wear Seat belts while driving.  Please note: You were cared for by a hospitalist during your hospital stay. Once you are discharged, your primary care physician will handle any further medical issues. Please note that NO REFILLS for any discharge medications will be authorized once you are discharged, as it is imperative that you return to your primary care physician (or establish a relationship with a primary care physician if you do not have one) for your post hospital discharge needs so that they can reassess your need for medications and monitor your lab values.   Increase activity slowly   Complete by: As directed    No dressing needed   Complete by: As directed       Allergies as of 10/03/2022   No Known Allergies      Medication List      TAKE these medications    aspirin EC 81 MG tablet Take 81 mg by mouth daily. 0900   atorvastatin 20 MG tablet Commonly known as: LIPITOR Take 20 mg by mouth at bedtime. 2000   Basaglar KwikPen 100 UNIT/ML Inject 8 Units into the skin at bedtime. 2100 DO NOT HOLD this insulin without provider notification What changed: how much to take   BD AutoShield Duo 30G X 5 MM Misc Generic drug: Insulin Pen Needle by Does not apply route. 3/16"   cyanocobalamin 1000 MCG tablet Commonly known as: VITAMIN B12 Take 1,000 mcg by mouth daily.   insulin aspart 100 UNIT/ML injection Commonly known as: novoLOG -6 Units, Subcutaneous, 3 times daily with meals CBG < 70: Implement Hypoglycemia measures CBG 70 - 120: 0 units CBG 121 - 150: 0 units CBG 151 - 200: 1 unit CBG 201-250: 2 units CBG 251-300: 3 units CBG 301-350: 4 units CBG 351-400: 5 units CBG > 400: Give 6 units and call MD   levothyroxine 125 MCG tablet Commonly known as: SYNTHROID  Take 125 mcg by mouth at bedtime. 2100   midodrine 10 MG tablet Commonly known as: PROAMATINE Take 10 mg by mouth See admin instructions. Send med with resident on  Tues/Thurs/Sat to dialysis. do not give at [facility], give to resident to take with him. send with '5mg'$  to equal '15mg'$ . 6:00   midodrine 5 MG tablet Commonly known as: PROAMATINE Take 5 mg by mouth See admin instructions. Send with resident on dialysis days.  Tues/Thurs/Sat  Take along with 10 mg to equal 15 mg 6:00   NON FORMULARY Diet: NAS, Cons CHO   omega-3 acid ethyl esters 1 g capsule Commonly known as: LOVAZA Take 2 g by mouth at bedtime. 2100   omeprazole 40 MG capsule Commonly known as: PRILOSEC Take 40 mg by mouth daily. 0900   polyethylene glycol 17 g packet Commonly known as: MIRALAX / GLYCOLAX Take 17 g by mouth daily as needed for mild constipation.   sevelamer carbonate 800 MG tablet Commonly known as: RENVELA Take 3 tablets (2,400 mg total) by mouth 3 (three)  times daily with meals. What changed:  when to take this Another medication with the same name was removed. Continue taking this medication, and follow the directions you see here.   tamsulosin 0.4 MG Caps capsule Commonly known as: FLOMAX Take 0.4 mg by mouth daily at 6 PM. Give 30 minutes after a meal. Do not crush or chew   torsemide 20 MG tablet Commonly known as: DEMADEX Take 20 mg by mouth as directed. 'Sunday, Monday, Wednesday, and Friday               Discharge Care Instructions  (From admission, onward)           Start     Ordered   10/03/22 0000  No dressing needed        12'$ /08/23 1333            Follow-up Information     Gerlene Fee, NP. Schedule an appointment as soon as possible for a visit in 1 week(s).   Specialty: Geriatric Medicine Contact information: [place] [address] [phone]         Outpatient hemodialysis center on Tuesdays Thursdays Saturdays Follow up.   Why: Resume usual schedule.        Cherre Robins, MD. Schedule an appointment as soon as possible for a visit in 1 week(s).   Specialties: Vascular Surgery, Interventional Cardiology Contact information: [address] [place] [address] [phone]                No Known Allergies   Other Procedures/Studies: IR Fluoro Guide CV Line Right  Result Date: 10/03/2022 INDICATION: End-stage renal disease, no current ACCESS FOR DIALYSIS EXAM: ULTRASOUND GUIDANCE FOR VASCULAR ACCESS RIGHT INTERNAL JUGULAR PERMANENT HEMODIALYSIS CATHETER Date:  10/03/2022 10/03/2022 9:20 am Radiologist:  M. Daryll Brod, MD Guidance:  Ultrasound and fluoroscopic FLUOROSCOPY: Fluoroscopy Time: 1 minutes 30 seconds (9 mGy). MEDICATIONS: 2 g Ancef within 1 hour of the procedure ANESTHESIA/SEDATION: Versed 1 mg IV; Fentanyl 50 mcg IV; Moderate Sedation Time:  20 minutes The patient was continuously monitored during the procedure by the interventional radiology  nurse under my direct supervision. CONTRAST:  None. COMPLICATIONS: None immediate. PROCEDURE: Informed consent was obtained from the patient following explanation of the procedure, risks, benefits and alternatives. The patient understands, agrees and consents for the procedure. All questions were addressed. A time out was performed. Maximal barrier sterile technique  utilized including caps, mask, sterile gowns, sterile gloves, large sterile drape, hand hygiene, and 2% chlorhexidine scrub. Under sterile conditions and local anesthesia, right internal jugular micropuncture venous access was performed with ultrasound. Images were obtained for documentation of the patent right internal jugular. A guide wire was inserted followed by a transitional dilator. Next, a 0.035 guidewire was advanced into the IVC with a 5-French catheter. Measurements were obtained from the right venotomy site to the proximal right atrium. In the right infraclavicular chest, a subcutaneous tunnel was created under sterile conditions and local anesthesia. 1% lidocaine with epinephrine was utilized for this. The 19 cm tip to cuff palindrome catheter was tunneled subcutaneously to the venotomy site and inserted into the SVC/RA junction through a valved peel-away sheath. Position was confirmed with fluoroscopy. Images were obtained for documentation. Blood was aspirated from the catheter followed by saline and heparin flushes. The appropriate volume and strength of heparin was instilled in each lumen. Caps were applied. The catheter was secured at the tunnel site with Gelfoam and a pursestring suture. The venotomy site was closed with subcuticular Vicryl suture. Dermabond was applied to the small right neck incision. A dry sterile dressing was applied. The catheter is ready for use. No immediate complications. IMPRESSION: Ultrasound and fluoroscopically guided right internal jugular tunneled hemodialysis catheter (19 cm tip to cuff palindrome  catheter). Electronically Signed   By: Jerilynn Mages.  Shick M.D.   On: 10/03/2022 09:37   IR US Guide Vasc Access Right  Result Date: 10/03/2022 INDICATION: End-stage renal disease, no current ACCESS FOR DIALYSIS EXAM: ULTRASOUND GUIDANCE FOR VASCULAR ACCESS RIGHT INTERNAL JUGULAR PERMANENT HEMODIALYSIS CATHETER Date:  10/03/2022 10/03/2022 9:20 am Radiologist:  M. Daryll Brod, MD Guidance:  Ultrasound and fluoroscopic FLUOROSCOPY: Fluoroscopy Time: 1 minutes 30 seconds (9 mGy). MEDICATIONS: 2 g Ancef within 1 hour of the procedure ANESTHESIA/SEDATION: Versed 1 mg IV; Fentanyl 50 mcg IV; Moderate Sedation Time:  20 minutes The patient was continuously monitored during the procedure by the interventional radiology nurse under my direct supervision. CONTRAST:  None. COMPLICATIONS: None immediate. PROCEDURE: Informed consent was obtained from the patient following explanation of the procedure, risks, benefits and alternatives. The patient understands, agrees and consents for the procedure. All questions were addressed. A time out was performed. Maximal barrier sterile technique utilized including caps, mask, sterile gowns, sterile gloves, large sterile drape, hand hygiene, and 2% chlorhexidine scrub. Under sterile conditions and local anesthesia, right internal jugular micropuncture venous access was performed with ultrasound. Images were obtained for documentation of the patent right internal jugular. A guide wire was inserted followed by a transitional dilator. Next, a 0.035 guidewire was advanced into the IVC with a 5-French catheter. Measurements were obtained from the right venotomy site to the proximal right atrium. In the right infraclavicular chest, a subcutaneous tunnel was created under sterile conditions and local anesthesia. 1% lidocaine with epinephrine was utilized for this. The 19 cm tip to cuff palindrome catheter was tunneled subcutaneously to the venotomy site and inserted into the SVC/RA junction through a  valved peel-away sheath. Position was confirmed with fluoroscopy. Images were obtained for documentation. Blood was aspirated from the catheter followed by saline and heparin flushes. The appropriate volume and strength of heparin was instilled in each lumen. Caps were applied. The catheter was secured at the tunnel site with Gelfoam and a pursestring suture. The venotomy site was closed with subcuticular Vicryl suture. Dermabond was applied to the small right neck incision. A dry sterile dressing was applied. The  catheter is ready for use. No immediate complications. IMPRESSION: Ultrasound and fluoroscopically guided right internal jugular tunneled hemodialysis catheter (19 cm tip to cuff palindrome catheter). Electronically Signed   By: Jerilynn Mages.  Shick M.D.   On: 10/03/2022 09:37   DG Chest Port 1 View  Result Date: 10/01/2022 CLINICAL DATA:  Missed dialysis. EXAM: PORTABLE CHEST 1 VIEW COMPARISON:  CXR 09/16/19 FINDINGS: No pleural effusion. No pneumothorax. Enlarged cardiac and mediastinal contours, likely unchanged from prior exam when accounting for differences in lung volumes. No focal airspace opacity. No definite evidence of overt pulmonary edema. No displaced rib fractures. Visualized upper abdomen is unremarkable. IMPRESSION: Enlarged cardiac and mediastinal contours, likely unchanged from prior exam when accounting for differences in lung volumes. There is mild pulmonary venous congestion without evidence of overt pulmonary edema. Electronically Signed   By: Marin Roberts M.D.   On: 10/01/2022 16:45     TODAY-DAY OF DISCHARGE:  Subjective:   Zachary [name] today has no headache,no chest abdominal pain,no new weakness tingling or numbness, feels much better wants to go home today.   Objective:   Blood pressure (!) 147/90, pulse 97, temperature (!) 97.4 F (36.3 C), temperature source Oral, resp. rate 18, height '5\' 8"'$  (1.727 m), weight 91.9 kg, SpO2 98 %.  Intake/Output Summary (Last 24 hours) at  10/03/2022 1334 Last data filed at 10/02/2022 2200 Gross per 24 hour  Intake 360 ml  Output --  Net 360 ml   Filed Weights   10/01/22 1556 10/02/22 1612  Weight: 93.3 kg 91.9 kg    Exam: Awake Alert, Oriented *3, No new F.N deficits, Normal affect [place].AT,PERRAL Supple Neck,No JVD, No cervical lymphadenopathy appriciated.  Symmetrical Chest wall movement, Good air movement bilaterally, CTAB RRR,No Gallops,Rubs or new Murmurs, No Parasternal Heave +ve B.Sounds, Abd Soft, Non tender, No organomegaly appriciated, No rebound -guarding or rigidity. No Cyanosis, Clubbing or edema, No new Rash or bruise   PERTINENT RADIOLOGIC STUDIES: IR Fluoro Guide CV Line Right  Result Date: 10/03/2022 INDICATION: End-stage renal disease, no current ACCESS FOR DIALYSIS EXAM: ULTRASOUND GUIDANCE FOR VASCULAR ACCESS RIGHT INTERNAL JUGULAR PERMANENT HEMODIALYSIS CATHETER Date:  10/03/2022 10/03/2022 9:20 am Radiologist:  M. Daryll Brod, MD Guidance:  Ultrasound and fluoroscopic FLUOROSCOPY: Fluoroscopy Time: 1 minutes 30 seconds (9 mGy). MEDICATIONS: 2 g Ancef within 1 hour of the procedure ANESTHESIA/SEDATION: Versed 1 mg IV; Fentanyl 50 mcg IV; Moderate Sedation Time:  20 minutes The patient was continuously monitored during the procedure by the interventional radiology nurse under my direct supervision. CONTRAST:  None. COMPLICATIONS: None immediate. PROCEDURE: Informed consent was obtained from the patient following explanation of the procedure, risks, benefits and alternatives. The patient understands, agrees and consents for the procedure. All questions were addressed. A time out was performed. Maximal barrier sterile technique utilized including caps, mask, sterile gowns, sterile gloves, large sterile drape, hand hygiene, and 2% chlorhexidine scrub. Under sterile conditions and local anesthesia, right internal jugular micropuncture venous access was performed with ultrasound. Images were obtained for  documentation of the patent right internal jugular. A guide wire was inserted followed by a transitional dilator. Next, a 0.035 guidewire was advanced into the IVC with a 5-French catheter. Measurements were obtained from the right venotomy site to the proximal right atrium. In the right infraclavicular chest, a subcutaneous tunnel was created under sterile conditions and local anesthesia. 1% lidocaine with epinephrine was utilized for this. The 19 cm tip to cuff palindrome catheter was tunneled subcutaneously to the venotomy site and  inserted into the SVC/RA junction through a valved peel-away sheath. Position was confirmed with fluoroscopy. Images were obtained for documentation. Blood was aspirated from the catheter followed by saline and heparin flushes. The appropriate volume and strength of heparin was instilled in each lumen. Caps were applied. The catheter was secured at the tunnel site with Gelfoam and a pursestring suture. The venotomy site was closed with subcuticular Vicryl suture. Dermabond was applied to the small right neck incision. A dry sterile dressing was applied. The catheter is ready for use. No immediate complications. IMPRESSION: Ultrasound and fluoroscopically guided right internal jugular tunneled hemodialysis catheter (19 cm tip to cuff palindrome catheter). Electronically Signed   By: Jerilynn Mages.  Shick M.D.   On: 10/03/2022 09:37   IR US Guide Vasc Access Right  Result Date: 10/03/2022 INDICATION: End-stage renal disease, no current ACCESS FOR DIALYSIS EXAM: ULTRASOUND GUIDANCE FOR VASCULAR ACCESS RIGHT INTERNAL JUGULAR PERMANENT HEMODIALYSIS CATHETER Date:  10/03/2022 10/03/2022 9:20 am Radiologist:  M. Daryll Brod, MD Guidance:  Ultrasound and fluoroscopic FLUOROSCOPY: Fluoroscopy Time: 1 minutes 30 seconds (9 mGy). MEDICATIONS: 2 g Ancef within 1 hour of the procedure ANESTHESIA/SEDATION: Versed 1 mg IV; Fentanyl 50 mcg IV; Moderate Sedation Time:  20 minutes The patient was continuously  monitored during the procedure by the interventional radiology nurse under my direct supervision. CONTRAST:  None. COMPLICATIONS: None immediate. PROCEDURE: Informed consent was obtained from the patient following explanation of the procedure, risks, benefits and alternatives. The patient understands, agrees and consents for the procedure. All questions were addressed. A time out was performed. Maximal barrier sterile technique utilized including caps, mask, sterile gowns, sterile gloves, large sterile drape, hand hygiene, and 2% chlorhexidine scrub. Under sterile conditions and local anesthesia, right internal jugular micropuncture venous access was performed with ultrasound. Images were obtained for documentation of the patent right internal jugular. A guide wire was inserted followed by a transitional dilator. Next, a 0.035 guidewire was advanced into the IVC with a 5-French catheter. Measurements were obtained from the right venotomy site to the proximal right atrium. In the right infraclavicular chest, a subcutaneous tunnel was created under sterile conditions and local anesthesia. 1% lidocaine with epinephrine was utilized for this. The 19 cm tip to cuff palindrome catheter was tunneled subcutaneously to the venotomy site and inserted into the SVC/RA junction through a valved peel-away sheath. Position was confirmed with fluoroscopy. Images were obtained for documentation. Blood was aspirated from the catheter followed by saline and heparin flushes. The appropriate volume and strength of heparin was instilled in each lumen. Caps were applied. The catheter was secured at the tunnel site with Gelfoam and a pursestring suture. The venotomy site was closed with subcuticular Vicryl suture. Dermabond was applied to the small right neck incision. A dry sterile dressing was applied. The catheter is ready for use. No immediate complications. IMPRESSION: Ultrasound and fluoroscopically guided right internal jugular  tunneled hemodialysis catheter (19 cm tip to cuff palindrome catheter). Electronically Signed   By: Jerilynn Mages.  Shick M.D.   On: 10/03/2022 09:37   DG Chest Port 1 View  Result Date: 10/01/2022 CLINICAL DATA:  Missed dialysis. EXAM: PORTABLE CHEST 1 VIEW COMPARISON:  CXR 09/16/19 FINDINGS: No pleural effusion. No pneumothorax. Enlarged cardiac and mediastinal contours, likely unchanged from prior exam when accounting for differences in lung volumes. No focal airspace opacity. No definite evidence of overt pulmonary edema. No displaced rib fractures. Visualized upper abdomen is unremarkable. IMPRESSION: Enlarged cardiac and mediastinal contours, likely unchanged from prior exam when  accounting for differences in lung volumes. There is mild pulmonary venous congestion without evidence of overt pulmonary edema. Electronically Signed   By: Marin Roberts M.D.   On: 10/01/2022 16:45     PERTINENT LAB RESULTS: CBC: Recent Labs    10/01/22 1611 10/03/22 0605  WBC 4.0 3.0*  HGB 12.5* 12.4*  HCT 39.0 37.5*  PLT 97* 81*   CMET CMP     Component Value Date/Time   NA 137 10/03/2022 0605   K 6.4 (HH) 10/03/2022 0605   CL 107 10/03/2022 0605   CO2 16 (L) 10/03/2022 0605   GLUCOSE 99 10/03/2022 0605   BUN 73 (H) 10/03/2022 0605   CREATININE 24.77 (H) 10/03/2022 0605   CALCIUM 8.9 10/03/2022 0605   CALCIUM 8.3 (L) 03/03/2015 0622   PROT 7.0 09/29/2022 0818   ALBUMIN 3.1 (L) 10/03/2022 0605   AST 14 (L) 09/29/2022 0818   ALT 12 09/29/2022 0818   ALKPHOS 54 09/29/2022 0818   BILITOT 0.6 09/29/2022 0818   GFRNONAA 2 (L) 10/03/2022 0605   GFRAA 5 (L) 04/09/2020 0607    GFR Estimated Creatinine Clearance: 2.9 mL/min (A) (by C-G formula based on SCr of 24.77 mg/dL (H)). No results for input(s): "LIPASE", "AMYLASE" in the last 72 hours. No results for input(s): "CKTOTAL", "CKMB", "CKMBINDEX", "TROPONINI" in the last 72 hours. Invalid input(s): "POCBNP" No results for input(s): "DDIMER" in the last 72  hours. No results for input(s): "HGBA1C" in the last 72 hours. No results for input(s): "CHOL", "HDL", "LDLCALC", "TRIG", "CHOLHDL", "LDLDIRECT" in the last 72 hours. No results for input(s): "TSH", "T4TOTAL", "T3FREE", "THYROIDAB" in the last 72 hours.  Invalid input(s): "FREET3" No results for input(s): "VITAMINB12", "FOLATE", "FERRITIN", "TIBC", "IRON", "RETICCTPCT" in the last 72 hours. Coags: No results for input(s): "INR" in the last 72 hours.  Invalid input(s): "PT" Microbiology: No results found for this or any previous visit (from the past 240 hour(s)).  FURTHER DISCHARGE INSTRUCTIONS:  Get Medicines reviewed and adjusted: Please take all your medications with you for your next visit with your Primary MD  Laboratory/radiological data: Please request your Primary MD to go over all hospital tests and procedure/radiological results at the follow up, please ask your Primary MD to get all Hospital records sent to his/her office.  In some cases, they will be blood work, cultures and biopsy results pending at the time of your discharge. Please request that your primary care M.D. goes through all the records of your hospital data and follows up on these results.  Also Note the following: If you experience worsening of your admission symptoms, develop shortness of breath, life threatening emergency, suicidal or homicidal thoughts you must seek medical attention immediately by calling 911 or calling your MD immediately  if symptoms less severe.  You must read complete instructions/literature along with all the possible adverse reactions/side effects for all the Medicines you take and that have been prescribed to you. Take any new Medicines after you have completely understood and accpet all the possible adverse reactions/side effects.   Do not drive when taking Pain medications or sleeping medications (Benzodaizepines)  Do not take more than prescribed Pain, Sleep and Anxiety  Medications. It is not advisable to combine anxiety,sleep and pain medications without talking with your primary care practitioner  Special Instructions: If you have smoked or chewed Tobacco  in the last 2 yrs please stop smoking, stop any regular Alcohol  and or any Recreational drug use.  Wear Seat belts while driving.  Please note: You were cared for by a hospitalist during your hospital stay. Once you are discharged, your primary care physician will handle any further medical issues. Please note that NO REFILLS for any discharge medications will be authorized once you are discharged, as it is imperative that you return to your primary care physician (or establish a relationship with a primary care physician if you do not have one) for your post hospital discharge needs so that they can reassess your need for medications and monitor your lab values.  Total Time spent coordinating discharge including counseling, education and face to face time equals greater than 30 minutes.  SignedOren Binet 10/03/2022 1:34 PM

## 2022-10-03 NOTE — Progress Notes (Signed)
  Transition of Care [facility]) Screening Note   Patient Details  Name: Zachary [name] Date of Birth: 11/22/1945   Transition of Care [facility]) CM/SW Contact:    Tom-Johnson, Renea Ee, RN Phone Number: 10/03/2022, 12:34 PM  Transition of Care Department [facility]) has reviewed patient and no TOC needs or recommendations have been identified at this time. TOC will continue to monitor patient advancement through interdisciplinary progression rounds. If new patient transition needs arise, please place a TOC consult.

## 2022-10-03 NOTE — Sedation Documentation (Signed)
In [place], in sinus tachycardia rate 103. Other VS stable. Nausea resolved. No pain. Feels fine.

## 2022-10-03 NOTE — Progress Notes (Addendum)
Advised pt will d/c to snf later today. Contacted [facility] and spoke to [place]. Clinic aware pt will d/c today and resume care tomorrow. Will fax d/c summary to clinic once completed for continuation of care.   Melven Sartorius Renal Navigator [phone]  Addendum at 1:43 pm: D/C summary and last renal note faxed to clinic for continuation of care.

## 2022-10-03 NOTE — Progress Notes (Signed)
PROGRESS NOTE        PATIENT DETAILS Name: Zachary [name] Age: 76 y.o. Sex: male Date of Birth: Jun 03, 1946 Admit Date: 10/01/2022 Admitting Physician Albertine Patricia, MD ERX:[id number], Phylis Bougie, NP  Brief Summary: Patient is a 76 y.o.  male with history of ESRD on HD-sent from SNF to [facility] for malfunctioning AVF, subsequently referred to [facility] for IR eval for declot procedure.  Significant events: 12/6>> admit to [facility]-malfunctioning AVF.  Significant studies: 12/6>> CXR: No PNA.  Significant microbiology data: None  Procedures: 12/8>> TDC placement by IR  Consults: IR Nephrology  Subjective: No major issues overnight-refused Lokelma yesterday and again this morning.  Objective: Vitals: Blood pressure (!) 147/90, pulse 97, temperature (!) 97.4 F (36.3 C), temperature source Oral, resp. rate 18, height '5\' 8"'$  (1.727 m), weight 91.9 kg, SpO2 98 %.   Exam: Gen Exam:not in any distress HEENT:atraumatic, normocephalic Chest: B/L clear to auscultation anteriorly CVS:S1S2 regular Abdomen:soft non tender, non distended Extremities:no edema Neurology: Non focal Skin: no rash  Pertinent Labs/Radiology:    Latest Ref Rng & Units 10/03/2022    6:05 AM 10/01/2022    4:11 PM 09/29/2022    8:18 AM  CBC  WBC 4.0 - 10.5 K/uL 3.0  4.0  4.3   Hemoglobin 13.0 - 17.0 g/dL 12.4  12.5  12.9   Hematocrit 39.0 - 52.0 % 37.5  39.0  41.1   Platelets 150 - 400 K/uL 81  97  108     Lab Results  Component Value Date   NA 137 10/03/2022   K 6.4 (HH) 10/03/2022   CL 107 10/03/2022   CO2 16 (L) 10/03/2022      Assessment/Plan: Malfunctioning AV fistula Felt to be unsalvageable TDC placed 12/8 Vascular following for permanent access placement  ESRD Nephrology following Defer HD to nephrology  Hyperkalemia Refused Lokelma yesterday evening and this morning TDC placed 12/8-awaiting hemodialysis In the interim-reattempt Lokelma, give 1 dose of IV  insulin/D50  DM-2 (A1c 5.0 on 12/4) CBG stable with SSI and 8 units of Semglee.  Recent Labs    10/02/22 1643 10/02/22 2031 10/03/22 0723  GLUCAP 119* 81 87     Orthostatic hypotension Continue with midodrine  Hypothyroidism Synthroid Most recent TSH minimally elevated-follow-up with PCP for repeat TSH in the next few weeks  Thrombocytopenia Mild-seems to be a chronic issue. Follow CBC  GERD PPI  BPH Flomax  Intellectual disability Seems to be at baseline  Obesity: Estimated body mass index is 30.81 kg/m as calculated from the following:   Height as of this encounter: '5\' 8"'$  (1.727 m).   Weight as of this encounter: 91.9 kg.   Code status:   Code Status: Full Code   DVT Prophylaxis: heparin injection 5,000 Units Start: 10/04/22 1000 SCDs Start: 10/01/22 2043   Family Communication: None at bedside   Disposition Plan: Status is: Inpatient Remains inpatient appropriate because: Malfunctioning arteriovenous fistula   Planned Discharge Destination:Skilled nursing facility   Diet: Diet Order             Diet renal/carb modified with fluid restriction Diet-HS Snack? Nothing; Fluid restriction: 1200 mL Fluid; Room service appropriate? Yes; Fluid consistency: Thin  Diet effective now                     Antimicrobial agents: Anti-infectives (From admission,  onward)    Start     Dose/Rate Route Frequency Ordered Stop   10/02/22 1530  ceFAZolin (ANCEF) IVPB 2g/100 mL premix        2 g 200 mL/hr over 30 Minutes Intravenous To Radiology 10/02/22 1522 10/03/22 0910        MEDICATIONS: Scheduled Meds:  atorvastatin  20 mg Oral QHS   Chlorhexidine Gluconate Cloth  6 each Topical Daily   cyanocobalamin  1,000 mcg Oral Daily   fentaNYL       [START ON 10/04/2022] heparin injection (subcutaneous)  5,000 Units Subcutaneous Q8H   insulin aspart  0-6 Units Subcutaneous TID WC   insulin glargine-yfgn  8 Units Subcutaneous QHS   levothyroxine  125 mcg  Oral Daily   midazolam       midodrine  15 mg Oral Q T,Th,Sat-1800   midodrine  15 mg Oral Once   omega-3 acid ethyl esters  2 g Oral QHS   ondansetron       pantoprazole  40 mg Oral Daily   sevelamer carbonate  2,400 mg Oral TID WC   sodium zirconium cyclosilicate  10 g Oral TID   tamsulosin  0.4 mg Oral q1800   [START ON 10/05/2022] torsemide  20 mg Oral Once per day on Sun Mon Wed Fri   Continuous Infusions: PRN Meds:.acetaminophen **OR** acetaminophen, fentaNYL, midazolam, ondansetron, polyethylene glycol   I have personally reviewed following labs and imaging studies  LABORATORY DATA: CBC: Recent Labs  Lab 09/29/22 0818 10/01/22 1611 10/03/22 0605  WBC 4.3 4.0 3.0*  HGB 12.9* 12.5* 12.4*  HCT 41.1 39.0 37.5*  MCV 108.4* 108.0* 105.6*  PLT 108* 97* 81*     Basic Metabolic Panel: Recent Labs  Lab 09/29/22 0818 10/01/22 1611 10/02/22 1245 10/02/22 1907 10/03/22 0605  NA 139 139 137 140 137  K 4.7 5.3* 6.0* 6.2* 6.4*  CL 106 103 111 109 107  CO2 21* 22 15* 19* 16*  GLUCOSE 112* 97 88 73 99  BUN 44* 61* 67* 70* 73*  CREATININE 16.87* 21.78* 23.39* 24.28* 24.77*  CALCIUM 9.3 9.3 9.2 9.4 8.9  PHOS  --   --  8.1*  --  8.4*     GFR: Estimated Creatinine Clearance: 2.9 mL/min (A) (by C-G formula based on SCr of 24.77 mg/dL (H)).  Liver Function Tests: Recent Labs  Lab 09/29/22 0818 10/02/22 1245 10/03/22 0605  AST 14*  --   --   ALT 12  --   --   ALKPHOS 54  --   --   BILITOT 0.6  --   --   PROT 7.0  --   --   ALBUMIN 3.5 3.3* 3.1*    No results for input(s): "LIPASE", "AMYLASE" in the last 168 hours. No results for input(s): "AMMONIA" in the last 168 hours.  Coagulation Profile: No results for input(s): "INR", "PROTIME" in the last 168 hours.  Cardiac Enzymes: No results for input(s): "CKTOTAL", "CKMB", "CKMBINDEX", "TROPONINI" in the last 168 hours.  BNP (last 3 results) No results for input(s): "PROBNP" in the last 8760 hours.  Lipid  Profile: No results for input(s): "CHOL", "HDL", "LDLCALC", "TRIG", "CHOLHDL", "LDLDIRECT" in the last 72 hours.  Thyroid Function Tests: No results for input(s): "TSH", "T4TOTAL", "FREET4", "T3FREE", "THYROIDAB" in the last 72 hours.  Anemia Panel: No results for input(s): "VITAMINB12", "FOLATE", "FERRITIN", "TIBC", "IRON", "RETICCTPCT" in the last 72 hours.  Urine analysis:    Component Value Date/Time   COLORURINE AMBER (  A) 06/12/2017 0615   APPEARANCEUR HAZY (A) 06/12/2017 0615   LABSPEC 1.021 06/12/2017 0615   PHURINE 5.0 06/12/2017 0615   GLUCOSEU NEGATIVE 06/12/2017 0615   HGBUR NEGATIVE 06/12/2017 0615   BILIRUBINUR NEGATIVE 06/12/2017 0615   KETONESUR NEGATIVE 06/12/2017 0615   PROTEINUR 30 (A) 06/12/2017 0615   UROBILINOGEN 0.2 07/20/2015 1850   NITRITE NEGATIVE 06/12/2017 0615   LEUKOCYTESUR NEGATIVE 06/12/2017 0615    Sepsis Labs: Lactic Acid, Venous    Component Value Date/Time   LATICACIDVEN 0.9 01/20/2018 9892    MICROBIOLOGY: No results found for this or any previous visit (from the past 240 hour(s)).  RADIOLOGY STUDIES/RESULTS: IR Fluoro Guide CV Line Right  Result Date: 10/03/2022 INDICATION: End-stage renal disease, no current ACCESS FOR DIALYSIS EXAM: ULTRASOUND GUIDANCE FOR VASCULAR ACCESS RIGHT INTERNAL JUGULAR PERMANENT HEMODIALYSIS CATHETER Date:  10/03/2022 10/03/2022 9:20 am Radiologist:  M. Daryll Brod, MD Guidance:  Ultrasound and fluoroscopic FLUOROSCOPY: Fluoroscopy Time: 1 minutes 30 seconds (9 mGy). MEDICATIONS: 2 g Ancef within 1 hour of the procedure ANESTHESIA/SEDATION: Versed 1 mg IV; Fentanyl 50 mcg IV; Moderate Sedation Time:  20 minutes The patient was continuously monitored during the procedure by the interventional radiology nurse under my direct supervision. CONTRAST:  None. COMPLICATIONS: None immediate. PROCEDURE: Informed consent was obtained from the patient following explanation of the procedure, risks, benefits and alternatives.  The patient understands, agrees and consents for the procedure. All questions were addressed. A time out was performed. Maximal barrier sterile technique utilized including caps, mask, sterile gowns, sterile gloves, large sterile drape, hand hygiene, and 2% chlorhexidine scrub. Under sterile conditions and local anesthesia, right internal jugular micropuncture venous access was performed with ultrasound. Images were obtained for documentation of the patent right internal jugular. A guide wire was inserted followed by a transitional dilator. Next, a 0.035 guidewire was advanced into the IVC with a 5-French catheter. Measurements were obtained from the right venotomy site to the proximal right atrium. In the right infraclavicular chest, a subcutaneous tunnel was created under sterile conditions and local anesthesia. 1% lidocaine with epinephrine was utilized for this. The 19 cm tip to cuff palindrome catheter was tunneled subcutaneously to the venotomy site and inserted into the SVC/RA junction through a valved peel-away sheath. Position was confirmed with fluoroscopy. Images were obtained for documentation. Blood was aspirated from the catheter followed by saline and heparin flushes. The appropriate volume and strength of heparin was instilled in each lumen. Caps were applied. The catheter was secured at the tunnel site with Gelfoam and a pursestring suture. The venotomy site was closed with subcuticular Vicryl suture. Dermabond was applied to the small right neck incision. A dry sterile dressing was applied. The catheter is ready for use. No immediate complications. IMPRESSION: Ultrasound and fluoroscopically guided right internal jugular tunneled hemodialysis catheter (19 cm tip to cuff palindrome catheter). Electronically Signed   By: Jerilynn Mages.  Shick M.D.   On: 10/03/2022 09:37   IR US Guide Vasc Access Right  Result Date: 10/03/2022 INDICATION: End-stage renal disease, no current ACCESS FOR DIALYSIS EXAM:  ULTRASOUND GUIDANCE FOR VASCULAR ACCESS RIGHT INTERNAL JUGULAR PERMANENT HEMODIALYSIS CATHETER Date:  10/03/2022 10/03/2022 9:20 am Radiologist:  M. Daryll Brod, MD Guidance:  Ultrasound and fluoroscopic FLUOROSCOPY: Fluoroscopy Time: 1 minutes 30 seconds (9 mGy). MEDICATIONS: 2 g Ancef within 1 hour of the procedure ANESTHESIA/SEDATION: Versed 1 mg IV; Fentanyl 50 mcg IV; Moderate Sedation Time:  20 minutes The patient was continuously monitored during the procedure by the interventional radiology  nurse under my direct supervision. CONTRAST:  None. COMPLICATIONS: None immediate. PROCEDURE: Informed consent was obtained from the patient following explanation of the procedure, risks, benefits and alternatives. The patient understands, agrees and consents for the procedure. All questions were addressed. A time out was performed. Maximal barrier sterile technique utilized including caps, mask, sterile gowns, sterile gloves, large sterile drape, hand hygiene, and 2% chlorhexidine scrub. Under sterile conditions and local anesthesia, right internal jugular micropuncture venous access was performed with ultrasound. Images were obtained for documentation of the patent right internal jugular. A guide wire was inserted followed by a transitional dilator. Next, a 0.035 guidewire was advanced into the IVC with a 5-French catheter. Measurements were obtained from the right venotomy site to the proximal right atrium. In the right infraclavicular chest, a subcutaneous tunnel was created under sterile conditions and local anesthesia. 1% lidocaine with epinephrine was utilized for this. The 19 cm tip to cuff palindrome catheter was tunneled subcutaneously to the venotomy site and inserted into the SVC/RA junction through a valved peel-away sheath. Position was confirmed with fluoroscopy. Images were obtained for documentation. Blood was aspirated from the catheter followed by saline and heparin flushes. The appropriate volume and  strength of heparin was instilled in each lumen. Caps were applied. The catheter was secured at the tunnel site with Gelfoam and a pursestring suture. The venotomy site was closed with subcuticular Vicryl suture. Dermabond was applied to the small right neck incision. A dry sterile dressing was applied. The catheter is ready for use. No immediate complications. IMPRESSION: Ultrasound and fluoroscopically guided right internal jugular tunneled hemodialysis catheter (19 cm tip to cuff palindrome catheter). Electronically Signed   By: Jerilynn Mages.  Shick M.D.   On: 10/03/2022 09:37   DG Chest Port 1 View  Result Date: 10/01/2022 CLINICAL DATA:  Missed dialysis. EXAM: PORTABLE CHEST 1 VIEW COMPARISON:  CXR 09/16/19 FINDINGS: No pleural effusion. No pneumothorax. Enlarged cardiac and mediastinal contours, likely unchanged from prior exam when accounting for differences in lung volumes. No focal airspace opacity. No definite evidence of overt pulmonary edema. No displaced rib fractures. Visualized upper abdomen is unremarkable. IMPRESSION: Enlarged cardiac and mediastinal contours, likely unchanged from prior exam when accounting for differences in lung volumes. There is mild pulmonary venous congestion without evidence of overt pulmonary edema. Electronically Signed   By: Marin Roberts M.D.   On: 10/01/2022 16:45     LOS: 2 days   Oren Binet, MD  [facility]    To contact the attending provider between 7A-7P or the covering provider during after hours 7P-7A, please log into the web site [email or web address] and access using universal [place] password for that web site. If you do not have the password, please call the hospital operator.  10/03/2022, 11:22 AM

## 2022-10-03 NOTE — Progress Notes (Signed)
Pt has been DC today, he is currently at HD and will be DC back to [facility] after HD. Report was given to [facility]. Cat[place], guardian was called to update the pt for DC but with no answer, RN left a voice mail.

## 2022-10-03 NOTE — TOC Transition Note (Signed)
Transition of Care [facility]) - CM/SW Discharge Note   Patient Details  Name: Zachary [name] MRN: [id number] Date of Birth: 06-Jun-1946  Transition of Care [facility]) CM/SW Contact:  Milinda Antis, [place] Phone Number: 10/03/2022, 1:41 PM   Clinical Narrative:    Patient will DC to: [facility] Anticipated DC date: 10/03/2022 Family notified: Legal guardian Transport by: Corey Harold   Per MD patient ready for DC to SNF. RN to call report prior to discharge [phone] room 112). RN,  patient's legal guardian, and facility notified of DC. Discharge Summary sent to facility. DC packet on chart. Ambulance transport requested for patient.   CSW will sign off for now as social work intervention is no longer needed. Please consult [place] again if new needs arise.    Final next level of care: [place] Barriers to Discharge: No Barriers Identified   Patient Goals and CMS Choice        Discharge Placement              Patient chooses bed at:  [facility]) Patient to be transferred to facility by: [employer] Name of family member notified: Amos,Katie (Legal Guardian) [phone] Patient and family notified of of transfer: 10/03/22  Discharge Plan and Services                                     Social Determinants of Health (SDOH) Interventions     Readmission Risk Interventions     No data to display

## 2022-10-03 NOTE — Sedation Documentation (Signed)
Patient became nauseated and wretching. Vomited a small amout of bile. Went into SVT at 150. Remained alert. Dr Annamaria Boots called into room and is present. Denied chest pain.

## 2022-10-03 NOTE — Care Management Important Message (Signed)
Important Message  Patient Details  Name: Zachary [name] MRN: [id number] Date of Birth: 03-17-1946   Medicare Important Message Given:  Yes     Orbie Pyo 10/03/2022, 3:29 PM

## 2022-10-03 NOTE — Progress Notes (Signed)
Subjective:  s/p TDC placement this AM-  awaiting HD -  leaving now Objective Vital signs in last 24 hours: Vitals:   10/03/22 0921 10/03/22 0923 10/03/22 0936 10/03/22 0955  BP:  (!) 146/99 (!) 146/93 (!) 147/90  Pulse: (!) 150 (!) 150 (!) 103 97  Resp:   12 18  Temp:    (!) 97.4 F (36.3 C)  TempSrc:    Oral  SpO2: 100%  100% 98%  Weight:      Height:       Weight change: -1.45 kg  Intake/Output Summary (Last 24 hours) at 10/03/2022 1211 Last data filed at 10/02/2022 2200 Gross per 24 hour  Intake 360 ml  Output --  Net 360 ml    Dialysis Orders:  TTS at [facility] 4:15hr, 400/500, EDW 95kg (getting under recently), AVF, 2K/2.5Ca, heparin 1600units/hr - no ESA - calcitriol 18mg PO q HD   Assessment/Plan:  LUE AVF dysfunction: After speaking with outpatient unit and [facility] interventional nephrologist who performed fistulogram 11/28 -> felt that this AVF is unsalvageable at this time. Needs TDC placement and can pursue new access as outpatient. Although technically not clotted - having issues with prolonged bleeding so I'd rather not use it.  S/p TDC [name] -  then for HD-  will do for one hour on a 1 K and then on 2 K  ESRD:  Usual TTS schedule - last HD was 12/2. Given Lokelma 10g BID yesterday and plan to dialyze [name] on a lower K bath   Hypertension/volume: BP high, no edema on exam. No acute respiratory issues at the moment, should improve with HD tomorrow.  Anemia: Hgb > 12, no ESA needed.  Metabolic bone disease: Ca ok, Phos pending. Would continue home meds. T2DM: per primary.  Intellectual disability: Has POA Zachary [name] to resume his HD tomorrow at his OP unit -  have let the care team know      Zachary [name]   Labs: Basic Metabolic Panel: Recent Labs  Lab 10/02/22 1245 10/02/22 1907 10/03/22 0605  NA 137 140 137  K 6.0* 6.2* 6.4*  CL 111 109 107  CO2 15* 19* 16*  GLUCOSE 88 73 99  BUN 67*  70* 73*  CREATININE 23.39* 24.28* 24.77*  CALCIUM 9.2 9.4 8.9  PHOS 8.1*  --  8.4*   Liver Function Tests: Recent Labs  Lab 09/29/22 0818 10/02/22 1245 10/03/22 0605  AST 14*  --   --   ALT 12  --   --   ALKPHOS 54  --   --   BILITOT 0.6  --   --   PROT 7.0  --   --   ALBUMIN 3.5 3.3* 3.1*   No results for input(s): "LIPASE", "AMYLASE" in the last 168 hours. No results for input(s): "AMMONIA" in the last 168 hours. CBC: Recent Labs  Lab 09/29/22 0818 10/01/22 1611 10/03/22 0605  WBC 4.3 4.0 3.0*  HGB 12.9* 12.5* 12.4*  HCT 41.1 39.0 37.5*  MCV 108.4* 108.0* 105.6*  PLT 108* 97* 81*   Cardiac Enzymes: No results for input(s): "CKTOTAL", "CKMB", "CKMBINDEX", "TROPONINI" in the last 168 hours. CBG: Recent Labs  Lab 10/02/22 1226 10/02/22 1643 10/02/22 2031 10/03/22 0723 10/03/22 1152  GLUCAP 85 119* 81 87 98    Iron Studies: No results for input(s): "IRON", "TIBC", "TRANSFERRIN", "FERRITIN" in the last 72 hours. Studies/Results: IR Fluoro Guide CV Line Right  Result  Date: 10/03/2022 INDICATION: End-stage renal disease, no current ACCESS FOR DIALYSIS EXAM: ULTRASOUND GUIDANCE FOR VASCULAR ACCESS RIGHT INTERNAL JUGULAR PERMANENT HEMODIALYSIS CATHETER Date:  10/03/2022 10/03/2022 9:20 am Radiologist:  M. Daryll Brod, MD Guidance:  Ultrasound and fluoroscopic FLUOROSCOPY: Fluoroscopy Time: 1 minutes 30 seconds (9 mGy). MEDICATIONS: 2 g Ancef within 1 hour of the procedure ANESTHESIA/SEDATION: Versed 1 mg IV; Fentanyl 50 mcg IV; Moderate Sedation Time:  20 minutes The patient was continuously monitored during the procedure by the interventional radiology nurse under my direct supervision. CONTRAST:  None. COMPLICATIONS: None immediate. PROCEDURE: Informed consent was obtained from the patient following explanation of the procedure, risks, benefits and alternatives. The patient understands, agrees and consents for the procedure. All questions were addressed. A time out was  performed. Maximal barrier sterile technique utilized including caps, mask, sterile gowns, sterile gloves, large sterile drape, hand hygiene, and 2% chlorhexidine scrub. Under sterile conditions and local anesthesia, right internal jugular micropuncture venous access was performed with ultrasound. Images were obtained for documentation of the patent right internal jugular. A guide wire was inserted followed by a transitional dilator. Next, a 0.035 guidewire was advanced into the IVC with a 5-French catheter. Measurements were obtained from the right venotomy site to the proximal right atrium. In the right infraclavicular chest, a subcutaneous tunnel was created under sterile conditions and local anesthesia. 1% lidocaine with epinephrine was utilized for this. The 19 cm tip to cuff palindrome catheter was tunneled subcutaneously to the venotomy site and inserted into the SVC/RA junction through a valved peel-away sheath. Position was confirmed with fluoroscopy. Images were obtained for documentation. Blood was aspirated from the catheter followed by saline and heparin flushes. The appropriate volume and strength of heparin was instilled in each lumen. Caps were applied. The catheter was secured at the tunnel site with Gelfoam and a pursestring suture. The venotomy site was closed with subcuticular Vicryl suture. Dermabond was applied to the small right neck incision. A dry sterile dressing was applied. The catheter is ready for use. No immediate complications. IMPRESSION: Ultrasound and fluoroscopically guided right internal jugular tunneled hemodialysis catheter (19 cm tip to cuff palindrome catheter). Electronically Signed   By: Jerilynn Mages.  Shick M.D.   On: 10/03/2022 09:37   IR US Guide Vasc Access Right  Result Date: 10/03/2022 INDICATION: End-stage renal disease, no current ACCESS FOR DIALYSIS EXAM: ULTRASOUND GUIDANCE FOR VASCULAR ACCESS RIGHT INTERNAL JUGULAR PERMANENT HEMODIALYSIS CATHETER Date:  10/03/2022  10/03/2022 9:20 am Radiologist:  M. Daryll Brod, MD Guidance:  Ultrasound and fluoroscopic FLUOROSCOPY: Fluoroscopy Time: 1 minutes 30 seconds (9 mGy). MEDICATIONS: 2 g Ancef within 1 hour of the procedure ANESTHESIA/SEDATION: Versed 1 mg IV; Fentanyl 50 mcg IV; Moderate Sedation Time:  20 minutes The patient was continuously monitored during the procedure by the interventional radiology nurse under my direct supervision. CONTRAST:  None. COMPLICATIONS: None immediate. PROCEDURE: Informed consent was obtained from the patient following explanation of the procedure, risks, benefits and alternatives. The patient understands, agrees and consents for the procedure. All questions were addressed. A time out was performed. Maximal barrier sterile technique utilized including caps, mask, sterile gowns, sterile gloves, large sterile drape, hand hygiene, and 2% chlorhexidine scrub. Under sterile conditions and local anesthesia, right internal jugular micropuncture venous access was performed with ultrasound. Images were obtained for documentation of the patent right internal jugular. A guide wire was inserted followed by a transitional dilator. Next, a 0.035 guidewire was advanced into the IVC with a 5-French catheter. Measurements were obtained  from the right venotomy site to the proximal right atrium. In the right infraclavicular chest, a subcutaneous tunnel was created under sterile conditions and local anesthesia. 1% lidocaine with epinephrine was utilized for this. The 19 cm tip to cuff palindrome catheter was tunneled subcutaneously to the venotomy site and inserted into the SVC/RA junction through a valved peel-away sheath. Position was confirmed with fluoroscopy. Images were obtained for documentation. Blood was aspirated from the catheter followed by saline and heparin flushes. The appropriate volume and strength of heparin was instilled in each lumen. Caps were applied. The catheter was secured at the tunnel site  with Gelfoam and a pursestring suture. The venotomy site was closed with subcuticular Vicryl suture. Dermabond was applied to the small right neck incision. A dry sterile dressing was applied. The catheter is ready for use. No immediate complications. IMPRESSION: Ultrasound and fluoroscopically guided right internal jugular tunneled hemodialysis catheter (19 cm tip to cuff palindrome catheter). Electronically Signed   By: Jerilynn Mages.  Shick M.D.   On: 10/03/2022 09:37   DG Chest Port 1 View  Result Date: 10/01/2022 CLINICAL DATA:  Missed dialysis. EXAM: PORTABLE CHEST 1 VIEW COMPARISON:  CXR 09/16/19 FINDINGS: No pleural effusion. No pneumothorax. Enlarged cardiac and mediastinal contours, likely unchanged from prior exam when accounting for differences in lung volumes. No focal airspace opacity. No definite evidence of overt pulmonary edema. No displaced rib fractures. Visualized upper abdomen is unremarkable. IMPRESSION: Enlarged cardiac and mediastinal contours, likely unchanged from prior exam when accounting for differences in lung volumes. There is mild pulmonary venous congestion without evidence of overt pulmonary edema. Electronically Signed   By: Marin Roberts M.D.   On: 10/01/2022 16:45   Medications: Infusions:   Scheduled Medications:  atorvastatin  20 mg Oral QHS   Chlorhexidine Gluconate Cloth  6 each Topical Daily   cyanocobalamin  1,000 mcg Oral Daily   fentaNYL       [START ON 10/04/2022] heparin injection (subcutaneous)  5,000 Units Subcutaneous Q8H   insulin aspart  0-6 Units Subcutaneous TID WC   insulin glargine-yfgn  8 Units Subcutaneous QHS   levothyroxine  125 mcg Oral Daily   midazolam       midodrine  15 mg Oral Q T,Th,Sat-1800   midodrine  15 mg Oral Once   omega-3 acid ethyl esters  2 g Oral QHS   ondansetron       pantoprazole  40 mg Oral Daily   sevelamer carbonate  2,400 mg Oral TID WC   sodium zirconium cyclosilicate  10 g Oral TID   tamsulosin  0.4 mg Oral q1800    [START ON 10/05/2022] torsemide  20 mg Oral Once per day on Sun Mon Wed Fri    have reviewed scheduled and prn medications.  Physical Exam: General:  alert, nad Heart: RRR Lungs: mostly clear Abdomen: soft, non tender Extremities:min edema Dialysis Access: new TDC-  left AVF is patent     10/03/2022,12:11 PM  LOS: 2 days

## 2022-10-03 NOTE — Progress Notes (Signed)
Attempted to see pt. Pt just back from interventional radiology to get fistula repaired. Nurse stated pt potassium 6+ and has not get been corrected. Asked to hold therapy as pt with run of SVT in procedure. Will attempt back as able. Jinger Neighbors, [place] [phone]

## 2022-10-03 NOTE — Progress Notes (Signed)
Received patient in bed to unit.  Alert and oriented.  Informed consent signed and in chart.   Treatment initiated: 1316 Treatment completed: 1700  Patient tolerated well.  Transported back to the room  Alert, without acute distress.  Hand-off given to patient's nurse.   Access used: Catheter  Access issues: none  Total UF removed: 1.5L Medication(s) given: none Post HD VS: 97/65,101,11,98%,97.9 Post HD weight: 90.9kg   [facility]

## 2022-10-03 NOTE — Progress Notes (Addendum)
[facility]  PROGRESS NOTE  ASSESSMENT / PLAN: GWENDOLYN NISHI is a 76 y.o. male with malfunctioning left arm arteriovenous fistula. TDC placed today. Plan revision of left arm arteriovenous fistula as outpatient. Likely next week. Please call for questions.  SUBJECTIVE: No complaints. TDC placed today.  OBJECTIVE: BP (!) 144/98 (BP Location: Right Arm)   Pulse 90   Temp (!) 97.5 F (36.4 C) (Oral)   Resp 15   Ht '5\' 8"'$  (1.727 m)   Wt 92.7 kg   SpO2 98%   BMI 31.07 kg/m   Intake/Output Summary (Last 24 hours) at 10/03/2022 1615 Last data filed at 10/02/2022 2200 Gross per 24 hour  Intake 360 ml  Output --  Net 360 ml   No acute distress Regular rate and rhythm Unlabored breathing Weak thrill in left arm fistula     Latest Ref Rng & Units 10/03/2022    6:05 AM 10/01/2022    4:11 PM 09/29/2022    8:18 AM  CBC  WBC 4.0 - 10.5 K/uL 3.0  4.0  4.3   Hemoglobin 13.0 - 17.0 g/dL 12.4  12.5  12.9   Hematocrit 39.0 - 52.0 % 37.5  39.0  41.1   Platelets 150 - 400 K/uL 81  97  108         Latest Ref Rng & Units 10/03/2022    6:05 AM 10/02/2022    7:07 PM 10/02/2022   12:45 PM  CMP  Glucose 70 - 99 mg/dL 99  73  88   BUN 8 - 23 mg/dL 73  70  67   Creatinine 0.61 - 1.24 mg/dL 24.77  24.28  23.39   Sodium 135 - 145 mmol/L 137  140  137   Potassium 3.5 - 5.1 mmol/L 6.4  6.2  6.0   Chloride 98 - 111 mmol/L 107  109  111   CO2 22 - 32 mmol/L '16  19  15   '$ Calcium 8.9 - 10.3 mg/dL 8.9  9.4  9.2     Estimated Creatinine Clearance: 2.9 mL/min (A) (by C-G formula based on SCr of 24.77 mg/dL (H)).  Yevonne Aline. Stanford Breed, MD [facility] Phone Number: [phone] 10/03/2022 4:15 PM

## 2022-10-03 NOTE — Procedures (Signed)
Interventional Radiology Procedure Note ? ?Procedure: RT IJ HD CATH   ? ?Complications: None ? ?Estimated Blood Loss:  MIN ? ?Findings: ?TIP SVCRA   ? ?M. TREVOR Zachary Pendergraph, MD ? ? ? ?

## 2022-10-04 DIAGNOSIS — Z992 Dependence on renal dialysis: Secondary | ICD-10-CM | POA: Diagnosis not present

## 2022-10-04 DIAGNOSIS — D631 Anemia in chronic kidney disease: Secondary | ICD-10-CM | POA: Diagnosis not present

## 2022-10-04 DIAGNOSIS — N2581 Secondary hyperparathyroidism of renal origin: Secondary | ICD-10-CM | POA: Diagnosis not present

## 2022-10-04 DIAGNOSIS — D509 Iron deficiency anemia, unspecified: Secondary | ICD-10-CM | POA: Diagnosis not present

## 2022-10-04 DIAGNOSIS — N186 End stage renal disease: Secondary | ICD-10-CM | POA: Diagnosis not present

## 2022-10-06 ENCOUNTER — Non-Acute Institutional Stay (INDEPENDENT_AMBULATORY_CARE_PROVIDER_SITE_OTHER): Payer: Medicare Other | Admitting: Family Medicine

## 2022-10-06 ENCOUNTER — Encounter: Payer: Self-pay | Admitting: Adult Health

## 2022-10-06 ENCOUNTER — Non-Acute Institutional Stay (SKILLED_NURSING_FACILITY): Payer: Medicare Other | Admitting: Adult Health

## 2022-10-06 ENCOUNTER — Other Ambulatory Visit: Payer: Self-pay

## 2022-10-06 ENCOUNTER — Telehealth: Payer: Self-pay

## 2022-10-06 DIAGNOSIS — F321 Major depressive disorder, single episode, moderate: Secondary | ICD-10-CM | POA: Diagnosis not present

## 2022-10-06 DIAGNOSIS — D696 Thrombocytopenia, unspecified: Secondary | ICD-10-CM

## 2022-10-06 DIAGNOSIS — N186 End stage renal disease: Secondary | ICD-10-CM

## 2022-10-06 DIAGNOSIS — E1169 Type 2 diabetes mellitus with other specified complication: Secondary | ICD-10-CM | POA: Diagnosis not present

## 2022-10-06 DIAGNOSIS — E1122 Type 2 diabetes mellitus with diabetic chronic kidney disease: Secondary | ICD-10-CM

## 2022-10-06 DIAGNOSIS — N2581 Secondary hyperparathyroidism of renal origin: Secondary | ICD-10-CM

## 2022-10-06 DIAGNOSIS — D631 Anemia in chronic kidney disease: Secondary | ICD-10-CM

## 2022-10-06 DIAGNOSIS — I700 Atherosclerosis of aorta: Secondary | ICD-10-CM

## 2022-10-06 DIAGNOSIS — R600 Localized edema: Secondary | ICD-10-CM | POA: Diagnosis not present

## 2022-10-06 DIAGNOSIS — E785 Hyperlipidemia, unspecified: Secondary | ICD-10-CM

## 2022-10-06 DIAGNOSIS — K219 Gastro-esophageal reflux disease without esophagitis: Secondary | ICD-10-CM

## 2022-10-06 DIAGNOSIS — E034 Atrophy of thyroid (acquired): Secondary | ICD-10-CM | POA: Diagnosis not present

## 2022-10-06 DIAGNOSIS — Z992 Dependence on renal dialysis: Secondary | ICD-10-CM | POA: Diagnosis not present

## 2022-10-06 DIAGNOSIS — Z794 Long term (current) use of insulin: Secondary | ICD-10-CM

## 2022-10-06 DIAGNOSIS — T82590D Other mechanical complication of surgically created arteriovenous fistula, subsequent encounter: Secondary | ICD-10-CM

## 2022-10-06 DIAGNOSIS — I120 Hypertensive chronic kidney disease with stage 5 chronic kidney disease or end stage renal disease: Secondary | ICD-10-CM

## 2022-10-06 MED ORDER — SODIUM CHLORIDE 0.9 % IV SOLN: 250.0000 mL | INTRAVENOUS | Status: DC | PRN

## 2022-10-06 MED ORDER — SODIUM CHLORIDE 0.9% FLUSH: 3.0000 mL | Freq: Two times a day (BID) | INTRAVENOUS | Status: DC

## 2022-10-06 MED FILL — Heparin Sodium (Porcine) Inj 1000 Unit/ML: INTRAMUSCULAR | Qty: 3200 | Status: AC

## 2022-10-06 NOTE — Progress Notes (Signed)
Location:  [place] of Service:   [place]: Full  No Known Allergies  Chief complain: Hospital Follow-up  HPI: Patient was admitted on 12/6 to [facility] due to AVF failure in the setting of ESRD on HD TTS. Patient was admitted and was found to have an unsalvageable AVF, IR was consulted and patient had a temporary dialysis catheter placed on 12/8 and resumed his regular dialysis schedule afterwards.    Past Medical History:  Diagnosis Date   Abnormal CT scan, kidney 10/06/2011   Acute pyelonephritis 10/07/2011   Anemia    normocytic   Anxiety    mental retardation   Bladder wall thickening 10/06/2011   BPH (benign prostatic hypertrophy)    Diabetes mellitus    Dialysis patient ([place])    Tuesday, Thursday and Saturday,    DVT of leg (deep venous thrombosis) ([place]) 12/25/2016   Edema     history of lower extremity edema   GERD (gastroesophageal reflux disease)    Heme positive stool    Hydronephrosis    Hyperkalemia    Hyperlipidemia    Hypernatremia    Hypertension    Hypothyroidism    Impaired speech    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[place])    MR (mental retardation)    Muscle weakness    Obstructive uropathy    Perinephric abscess 10/07/2011   Poor historian poor historian   Primary colorectal adenocarcinoma ([place]) 04/2017   S/p total colectomy   Protein calorie malnutrition ([place])    Pyelonephritis    Renal failure (ARF), acute on chronic (HCC)    Renal insufficiency    chronic history   Sepsis ([place])    Smoking    Uremia    Urinary retention    UTI (lower urinary tract infection) 10/06/2011    Past Surgical History:  Procedure Laterality Date   A/V FISTULAGRAM N/A 08/13/2018   Procedure: A/V FISTULAGRAM - Right Upper;  Surgeon: Elam Dutch, MD;  Location: [place] [facility];  Service: Cardiovascular;  Laterality: N/A;   A/V FISTULAGRAM N/A 11/22/2018   Procedure: A/V FISTULAGRAM - Right  Upper;  Surgeon: Waynetta Sandy, MD;  Location: [place] [facility];  Service: Cardiovascular;  Laterality: N/A;   AV FISTULA PLACEMENT Left 07/06/2015   Procedure:  INSERTION LEFT ARM ARTERIOVENOUS GORTEX GRAFT;  Surgeon: Angelia Mould, MD;  Location: [place];  Service: Vascular;  Laterality: Left;   AV FISTULA PLACEMENT Right 02/26/2016   Procedure: ARTERIOVENOUS (AV) FISTULA CREATION ;  Surgeon: Angelia Mould, MD;  Location: [place];  Service: Vascular;  Laterality: Right;   AV FISTULA PLACEMENT Right 11/25/2018   Procedure: INSERTION OF ARTERIOVENOUS (AV) ARTEGRAFT RIGHT UPPER ARM;  Surgeon: Waynetta Sandy, MD;  Location: [place];  Service: Vascular;  Laterality: Right;   AV FISTULA PLACEMENT Left 05/28/2021   Procedure: LEFT ARM ARTERIOVENOUS (AV) FISTULA;  Surgeon: Rosetta Posner, MD;  Location: [facility];  Service: Vascular;  Laterality: Left;   [place] REMOVAL Left 10/09/2015   Procedure: REMOVAL OF ARTERIOVENOUS GORETEX GRAFT ([place]) Evacuation of Lymphocele, Vein Patch angioplasty of brachial artery.;  Surgeon: Angelia Mould, MD;  Location: [place];  Service: Vascular;  Laterality: Left;   [place] Right 02/26/2016   Procedure: Right [place];  Surgeon: Angelia Mould, MD;  Location: [place];  Service: Vascular;  Laterality: Right;   [place] Left 07/30/2021  Procedure: LEFT ARM SECOND STAGE BASILIC VEIN TRANSPOSITION;  Surgeon: Rosetta Posner, MD;  Location: [facility];  Service: Vascular;  Laterality: Left;   CIRCUMCISION N/A 01/04/2014   Procedure: CIRCUMCISION ADULT (procedure #1);  Surgeon: Marissa Nestle, MD;  Location: [facility];  Service: Urology;  Laterality: N/A;   COLECTOMY N/A 05/04/2017   Procedure: TOTAL COLECTOMY;  Surgeon: Aviva Signs, MD;  Location: [facility];  Service: General;  Laterality: N/A;   COLONOSCOPY N/A 04/27/2017   Surgeon: Daneil Dolin, MD; annular mass in the ascending  colon likely representing cancer biopsied, multiple 6-22 mm polyps removed, clean rectum.  Pathology with multiple tubular adenomas, high-grade dysplasia noted in ascending colon and splenic flexure.   CYSTOSCOPY W/ RETROGRADES Bilateral 06/29/2015   Procedure: CYSTOSCOPY, DILATION OF URETHRAL STRICTURE WITH BILATERAL RETROGRADE PYELOGRAM,SUPRAPUBIC TUBE CHANGE;  Surgeon: Festus Aloe, MD;  Location: [facility];  Service: Urology;  Laterality: Bilateral;   CYSTOSCOPY WITH URETHRAL DILATATION N/A 12/29/2013   Procedure: CYSTOSCOPY WITH URETHRAL DILATATION;  Surgeon: Marissa Nestle, MD;  Location: [facility];  Service: Urology;  Laterality: N/A;   ESOPHAGOGASTRODUODENOSCOPY N/A 04/27/2017   Procedure: ESOPHAGOGASTRODUODENOSCOPY (EGD);  Surgeon: Daneil Dolin, MD;  [facility];  Service: Endoscopy;  Laterality: N/A;   FLEXIBLE SIGMOIDOSCOPY N/A 12/02/2021   Procedure: FLEXIBLE SIGMOIDOSCOPY;  Surgeon: Eloise Harman, DO;  [facility];  Service: Endoscopy;  Laterality: N/A;  10:30am, dialysis patient   INSERTION OF DIALYSIS CATHETER Right 11/25/2018   Procedure: INSERTION OF DIALYSIS CATHETER RIGHT INTERNAL JUGULAR;  Surgeon: Waynetta Sandy, MD;  Location: [place];  Service: Vascular;  Laterality: Right;   IR AV DIALY SHUNT INTRO NEEDLE/INTRACATH INITIAL W/PTA/IMG RIGHT Right 09/07/2018   IR FLUORO GUIDE CV LINE RIGHT  10/16/2020   IR FLUORO GUIDE CV LINE RIGHT  10/03/2022   IR REMOVAL TUN CV CATH W/O FL  01/12/2019   IR THROMBECTOMY AV FISTULA W/THROMBOLYSIS/PTA INC/SHUNT/IMG RIGHT Right 04/26/2018   IR US GUIDE VASC ACCESS RIGHT  04/26/2018   IR US GUIDE VASC ACCESS RIGHT  09/07/2018   IR US GUIDE VASC ACCESS RIGHT  10/16/2020   IR US GUIDE VASC ACCESS RIGHT  10/16/2020   IR US GUIDE VASC ACCESS RIGHT  10/03/2022   ORIF FEMUR FRACTURE Right 11/22/2016   Procedure: OPEN REDUCTION INTERNAL FIXATION (ORIF) DISTAL FEMUR FRACTURE;  Surgeon: Rod Can, MD;   Location: [place];  Service: Orthopedics;  Laterality: Right;   PATCH ANGIOPLASTY Right 12/10/2017   Procedure: PATCH ANGIOPLASTY;  Surgeon: Angelia Mould, MD;  Location: [place];  Service: Vascular;  Laterality: Right;   PERIPHERAL VASCULAR BALLOON ANGIOPLASTY  08/13/2018   Procedure: PERIPHERAL VASCULAR BALLOON ANGIOPLASTY;  Surgeon: Elam Dutch, MD;  Location: [place] [facility];  Service: Cardiovascular;;  right AV fistula    PERIPHERAL VASCULAR BALLOON ANGIOPLASTY  11/22/2018   Procedure: PERIPHERAL VASCULAR BALLOON ANGIOPLASTY;  Surgeon: Waynetta Sandy, MD;  Location: [place] [facility];  Service: Cardiovascular;;  rt AV fistula   PERIPHERAL VASCULAR CATHETERIZATION N/A 10/08/2015   Procedure: A/V Shuntogram;  Surgeon: Angelia Mould, MD;  Location: [place] [facility];  Service: Cardiovascular;  Laterality: N/A;   REMOVAL OF A DIALYSIS CATHETER N/A 12/03/2021   Procedure: MINOR REMOVAL OF A TUNNELED DIALYSIS CATHETER;  Surgeon: Rosetta Posner, MD;  Location: [facility];  Service: Vascular;  Laterality: N/A;   THROMBECTOMY W/ EMBOLECTOMY Right 12/10/2017   Procedure: THROMBECTOMY REVISION RIGHT ARM  ARTERIOVENOUS FISTULA;  Surgeon: Angelia Mould, MD;  Location[facility];  Service: Vascular;  Laterality: Right;   TRANSURETHRAL RESECTION OF PROSTATE N/A 01/04/2014   Procedure: TRANSURETHRAL RESECTION OF THE PROSTATE (TURP) (procedure #2);  Surgeon: Marissa Nestle, MD;  [facility];  Service: Urology;  Laterality: N/A;    Social History   Socioeconomic History   Marital status: Single    Spouse name: Not on file   Number of children: Not on file   Years of education: Not on file   Highest education level: Not on file  Occupational History   Occupation: retired   Tobacco Use   Smoking status: Never   Smokeless tobacco: Never  Vaping Use   Vaping Use: Never used  Substance and Sexual Activity   Alcohol use: No   Drug use: No   Sexual activity:  Not Currently  Other Topics Concern   Not on file  Social History Narrative   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   [facility] of [facility]    Social Determinants of Health   Financial Resource Strain: Low Risk  (02/18/2018)   Overall Financial Resource Strain (CARDIA)    Difficulty of Paying Living Expenses: Not hard at all  Food Insecurity: No Food Insecurity (02/18/2018)   Hunger Vital Sign    Worried About Running Out of Food in the Last Year: Never true    Ran Out of Food in the Last Year: Never true  Transportation Needs: No Transportation Needs (02/18/2018)   PRAPARE - Hydrologist (Medical): No    Lack of Transportation (Non-Medical): No  Physical Activity: Unknown (09/15/2019)   Exercise Vital Sign    Days of Exercise per Week: Not on file    Minutes of Exercise per Session: 0 min  Stress: No Stress Concern Present (02/18/2018)   [place]    Feeling of Stress : Not at all  Social Connections: Unknown (09/15/2019)   Social Connection and Isolation Panel [NHANES]    Frequency of Communication with Friends and Family: Not on file    Frequency of Social Gatherings with Friends and Family: Never    Attends Religious Services: Not on Diplomatic Services operational officer of Clubs or Organizations: Not on file    Attends Archivist Meetings: Not on file    Marital Status: Not on file  Intimate Partner Violence: Not At Risk (02/18/2018)   Humiliation, Afraid, Rape, and Kick questionnaire    Fear of Current or Ex-Partner: No    Emotionally Abused: No    Physically Abused: No    Sexually Abused: No   Family History  Problem Relation Age of Onset   Cancer Mother    Colon cancer Neg Hx       VITAL SIGNS Vitals: Temp 40F, HR 78, RR 20, BP 113/72, O2 sat 97%  Outpatient Encounter Medications as of 10/06/2022  Medication Sig   aspirin EC 81 MG  tablet Take 81 mg by mouth daily. 0900   atorvastatin (LIPITOR) 20 MG tablet Take 20 mg by mouth at bedtime. 2000   insulin aspart (NOVOLOG) 100 UNIT/ML injection -6 Units, Subcutaneous, 3 times daily with meals CBG < 70: Implement Hypoglycemia measures CBG 70 - 120: 0 units CBG 121 - 150: 0 units CBG 151 - 200: 1 unit CBG 201-250: 2 units CBG 251-300: 3 units CBG 301-350: 4 units CBG 351-400: 5 units CBG >  400: Give 6 units and call MD   Insulin Glargine (BASAGLAR KWIKPEN) 100 UNIT/ML Inject 8 Units into the skin at bedtime. 2100 DO NOT HOLD this insulin without provider notification   Insulin Pen Needle (BD AUTOSHIELD DUO) 30G X 5 MM MISC by Does not apply route. 3/16"   levothyroxine (SYNTHROID) 125 MCG tablet Take 125 mcg by mouth at bedtime. 2100   midodrine (PROAMATINE) 10 MG tablet Take 10 mg by mouth See admin instructions. Send med with resident on  Tues/Thurs/Sat to dialysis. do not give [facility], give to resident to take with him. send with '5mg'$  to equal '15mg'$ . 6:00   midodrine (PROAMATINE) 5 MG tablet Take 5 mg by mouth See admin instructions. Send with resident on dialysis days.  Tues/Thurs/Sat  Take along with 10 mg to equal 15 mg 6:00   NON FORMULARY Diet: NAS, Cons CHO   omega-3 acid ethyl esters (LOVAZA) 1 g capsule Take 2 g by mouth at bedtime. 2100   omeprazole (PRILOSEC) 40 MG capsule Take 40 mg by mouth daily. 0900   polyethylene glycol (MIRALAX / GLYCOLAX) packet Take 17 g by mouth daily as needed for mild constipation.    sevelamer carbonate (RENVELA) 800 MG tablet Take 3 tablets (2,400 mg total) by mouth 3 (three) times daily with meals.   tamsulosin (FLOMAX) 0.4 MG CAPS capsule Take 0.4 mg by mouth daily at 6 PM. Give 30 minutes after a meal. Do not crush or chew   torsemide (DEMADEX) 20 MG tablet Take 20 mg by mouth as directed. Sunday, Monday, Wednesday, and Friday   vitamin B-12 (CYANOCOBALAMIN) 1000 MCG tablet Take 1,000 mcg by mouth daily.   No facility-administered  encounter medications on file as of 10/06/2022.     SIGNIFICANT DIAGNOSTIC EXAMS General: NAD elderly male, supine in bed  Chest: temporary HD catheter placed on right chest with no signs of erythema and dressing that is CDI Resp: breathing comfortably on room air   ASSESSMENT/ PLAN:  ESRD on HD Currently stable with no acute concerns, is awaiting more permanent access for HD. - Repeat BMP 12/14  - Patient resumed HD schedule of TTS - Needs follow-up with nephrology - VVS follow-up for fistula  Hyperkalemia Noted K of 6.4 prior to discharge, had subsequent HD. - Repeat BMP 12/14  Hypothyroidism TSH during hospitalization was 5.861 and is currently on levothyroxine 137mg - Recommend rechecking in 2-4 weeks - Adjust levothyroxine at that time if appropriate  Thrombocytopenia Appears to be chronic per last several CBC labs, most recently was 81 (which is the lowest measurement this year) - Repeat CBC in 1 month   ARise Patience DO  PGY-3 [facility] Medicine Resident

## 2022-10-06 NOTE — Progress Notes (Signed)
Location:  [facility] [place] Room Number: NO/112/D Place of Service:  SNF (31) Ok Edwards S.,NP  CODE STATUS: FULL  No Known Allergies  Chief Complaint  Patient presents with   Hospitalization Follow-up    HPI:  He is a 76 year old long term resident of this facility who has been hospitalized from 10-01-22 through 10-03-22. His medical history includes: esrd; diabetes; hypertension; hypothyroidism. He presented to [facility] from dialysis for hyperkalemia and nonfunctional left upper extremity dialysis access. Left arm fistula was found to be non salvageable. Vascular surgery consulted will be followed up on an outpatient basis. For his hyperkalemia was give D50 and insulin. Today he has ambulated to the day room with a walker. Denies any concerns; states is feeling good. 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:     Past Medical History:  Diagnosis Date   Abnormal CT scan, kidney 10/06/2011   Acute pyelonephritis 10/07/2011   Anemia    normocytic   Anxiety    mental retardation   Bladder wall thickening 10/06/2011   BPH (benign prostatic hypertrophy)    Diabetes mellitus    Dialysis patient ([place])    Tuesday, Thursday and Saturday,    DVT of leg (deep venous thrombosis) ([place]) 12/25/2016   Edema     history of lower extremity edema   GERD (gastroesophageal reflux disease)    Heme positive stool    Hydronephrosis    Hyperkalemia    Hyperlipidemia    Hypernatremia    Hypertension    Hypothyroidism    Impaired speech    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[place])    MR (mental retardation)    Muscle weakness    Obstructive uropathy    Perinephric abscess 10/07/2011   Poor historian poor historian   Primary  colorectal adenocarcinoma ([place]) 04/2017   S/p total colectomy   Protein calorie malnutrition ([place])    Pyelonephritis    Renal failure (ARF), acute on chronic (HCC)    Renal insufficiency    chronic history   Sepsis ([place])    Smoking    Uremia    Urinary retention    UTI (lower urinary tract infection) 10/06/2011    Past Surgical History:  Procedure Laterality Date   A/V FISTULAGRAM N/A 08/13/2018   Procedure: A/V FISTULAGRAM - Right Upper;  Surgeon: Elam Dutch, MD;  Location: [facility];  Service: Cardiovascular;  Laterality: N/A;   A/V FISTULAGRAM N/A 11/22/2018   Procedure: A/V FISTULAGRAM - Right Upper;  Surgeon: Waynetta Sandy, MD;  Location: [facility];  Service: Cardiovascular;  Laterality: N/A;   A/V FISTULAGRAM Left 10/08/2022   Procedure: A/V Fistulagram;  Surgeon: Broadus John, MD;  Location: [facility];  Service: Cardiovascular;  Laterality: Left;   AV FISTULA PLACEMENT Left 07/06/2015   Procedure:  INSERTION LEFT ARM ARTERIOVENOUS GORTEX GRAFT;  Surgeon: Angelia Mould, MD;  Location: [facility];  Service: Vascular;  Laterality: Left;   AV FISTULA PLACEMENT Right 02/26/2016   Procedure: ARTERIOVENOUS (AV) FISTULA CREATION ;  Surgeon: Angelia Mould, MD;  Location: [place];  Service: Vascular;  Laterality: Right;   AV FISTULA PLACEMENT Right 11/25/2018   Procedure: INSERTION OF ARTERIOVENOUS (AV) ARTEGRAFT RIGHT UPPER ARM;  Surgeon: Waynetta Sandy,  MD;  Location: [facility];  Service: Vascular;  Laterality: Right;   AV FISTULA PLACEMENT Left 05/28/2021   Procedure: LEFT ARM ARTERIOVENOUS (AV) FISTULA;  Surgeon: Rosetta Posner, MD;  Location: [facility];  Service: Vascular;  Laterality: Left;   [place] Left 10/09/2015   Procedure: REMOVAL OF ARTERIOVENOUS GORETEX GRAFT ([place]) Evacuation of Lymphocele, Vein Patch angioplasty of brachial artery.;  Surgeon: Angelia Mould, MD;  Location: [facility];  Service: Vascular;   Laterality: Left;   [place] Right 02/26/2016   Procedure: Right [place];  Surgeon: Angelia Mould, MD;  Location: [place];  Service: Vascular;  Laterality: Right;   [place] Left 07/30/2021   Procedure: LEFT ARM SECOND STAGE BASILIC VEIN TRANSPOSITION;  Surgeon: Rosetta Posner, MD;  Location: [facility];  Service: Vascular;  Laterality: Left;   CIRCUMCISION N/A 01/04/2014   Procedure: CIRCUMCISION ADULT (procedure #1);  Surgeon: Marissa Nestle, MD;  Location: [facility];  Service: Urology;  Laterality: N/A;   COLECTOMY N/A 05/04/2017   Procedure: TOTAL COLECTOMY;  Surgeon: Aviva Signs, MD;  Location: [facility];  Service: General;  Laterality: N/A;   COLONOSCOPY N/A 04/27/2017   Surgeon: Daneil Dolin, MD; annular mass in the ascending colon likely representing cancer biopsied, multiple 6-22 mm polyps removed, clean rectum.  Pathology with multiple tubular adenomas, high-grade dysplasia noted in ascending colon and splenic flexure.   CYSTOSCOPY W/ RETROGRADES Bilateral 06/29/2015   Procedure: CYSTOSCOPY, DILATION OF URETHRAL STRICTURE WITH BILATERAL RETROGRADE PYELOGRAM,SUPRAPUBIC TUBE CHANGE;  Surgeon: Festus Aloe, MD;  Location[facility];  Service: Urology;  Laterality: Bilateral;   CYSTOSCOPY WITH URETHRAL DILATATION N/A 12/29/2013   Procedure: CYSTOSCOPY WITH URETHRAL DILATATION;  Surgeon: Marissa Nestle, MD;  [facility];  Service: Urology;  Laterality: N/A;   ESOPHAGOGASTRODUODENOSCOPY N/A 04/27/2017   Procedure: ESOPHAGOGASTRODUODENOSCOPY (EGD);  Surgeon: Daneil Dolin, MD;  [facility];  Service: Endoscopy;  Laterality: N/A;   FLEXIBLE SIGMOIDOSCOPY N/A 12/02/2021   Procedure: FLEXIBLE SIGMOIDOSCOPY;  Surgeon: Eloise Harman, DO;  [facility];  Service: Endoscopy;  Laterality: N/A;  10:30am, dialysis patient   INSERTION OF DIALYSIS CATHETER Right 11/25/2018   Procedure: INSERTION OF DIALYSIS  CATHETER RIGHT INTERNAL JUGULAR;  Surgeon: Waynetta Sandy, MD;  Location: [facility];  Service: Vascular;  Laterality: Right;   IR AV DIALY SHUNT INTRO NEEDLE/INTRACATH INITIAL W/PTA/IMG RIGHT Right 09/07/2018   IR FLUORO GUIDE CV LINE RIGHT  10/16/2020   IR FLUORO GUIDE CV LINE RIGHT  10/03/2022   IR REMOVAL TUN CV CATH W/O FL  01/12/2019   IR THROMBECTOMY AV FISTULA W/THROMBOLYSIS/PTA INC/SHUNT/IMG RIGHT Right 04/26/2018   IR US GUIDE VASC ACCESS RIGHT  04/26/2018   IR US GUIDE VASC ACCESS RIGHT  09/07/2018   IR US GUIDE VASC ACCESS RIGHT  10/16/2020   IR US GUIDE VASC ACCESS RIGHT  10/16/2020   IR US GUIDE VASC ACCESS RIGHT  10/03/2022   ORIF FEMUR FRACTURE Right 11/22/2016   Procedure: OPEN REDUCTION INTERNAL FIXATION (ORIF) DISTAL FEMUR FRACTURE;  Surgeon: Rod Can, MD;  Location: [facility];  Service: Orthopedics;  Laterality: Right;   PATCH ANGIOPLASTY Right 12/10/2017   Procedure: PATCH ANGIOPLASTY;  Surgeon: Angelia Mould, MD;  Location: [place];  Service: Vascular;  Laterality: Right;   PERIPHERAL VASCULAR BALLOON ANGIOPLASTY  08/13/2018   Procedure: PERIPHERAL VASCULAR BALLOON ANGIOPLASTY;  Surgeon: Elam Dutch, MD;  Location: [facility];  Service: Cardiovascular;;  right AV fistula    PERIPHERAL VASCULAR BALLOON ANGIOPLASTY  11/22/2018   Procedure: PERIPHERAL VASCULAR BALLOON ANGIOPLASTY;  Surgeon: Waynetta Sandy, MD;  Location: [facility];  Service: Cardiovascular;;  rt AV fistula   PERIPHERAL VASCULAR BALLOON ANGIOPLASTY  10/08/2022   Procedure: PERIPHERAL VASCULAR BALLOON ANGIOPLASTY;  Surgeon: Broadus John, MD;  Location: [facility];  Service: Cardiovascular;;  arterial anastamosis   PERIPHERAL VASCULAR CATHETERIZATION N/A 10/08/2015   Procedure: A/V Shuntogram;  Surgeon: Angelia Mould, MD;  Location: [facility];  Service: Cardiovascular;  Laterality: N/A;   PERIPHERAL VASCULAR INTERVENTION Left  10/08/2022   Procedure: PERIPHERAL VASCULAR INTERVENTION;  Surgeon: Broadus John, MD;  Location: [facility];  Service: Cardiovascular;  Laterality: Left;  left AVF   REMOVAL OF A DIALYSIS CATHETER N/A 12/03/2021   Procedure: MINOR REMOVAL OF A TUNNELED DIALYSIS CATHETER;  Surgeon: Rosetta Posner, MD;  [facility];  Service: Vascular;  Laterality: N/A;   THROMBECTOMY W/ EMBOLECTOMY Right 12/10/2017   Procedure: THROMBECTOMY REVISION RIGHT ARM  ARTERIOVENOUS FISTULA;  Surgeon: Angelia Mould, MD;  Location: [facility] OR;  Service: Vascular;  Laterality: Right;   TRANSURETHRAL RESECTION OF PROSTATE N/A 01/04/2014   Procedure: TRANSURETHRAL RESECTION OF THE PROSTATE (TURP) (procedure #2);  Surgeon: Marissa Nestle, MD;  [facility];  Service: Urology;  Laterality: N/A;    Social History   Socioeconomic History   Marital status: Single    Spouse name: Not on file   Number of children: Not on file   Years of education: Not on file   Highest education level: Not on file  Occupational History   Occupation: retired   Tobacco Use   Smoking status: Never   Smokeless tobacco: Never  Vaping Use   Vaping Use: Never used  Substance and Sexual Activity   Alcohol use: No   Drug use: No   Sexual activity: Not Currently  Other Topics Concern   Not on file  Social History Narrative   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   [facility] term resident of [facility]    Social Determinants of Health   Financial Resource Strain: Low Risk  (02/18/2018)   Overall Financial Resource Strain (CARDIA)    Difficulty of Paying Living Expenses: Not hard at all  Food Insecurity: No Food Insecurity (02/18/2018)   Hunger Vital Sign    Worried About Running Out of Food in the Last Year: Never true    Ran Out of Food in the Last Year: Never true  Transportation Needs: No Transportation Needs (02/18/2018)   PRAPARE - Hydrologist (Medical):  No    Lack of Transportation (Non-Medical): No  Physical Activity: Unknown (09/15/2019)   Exercise Vital Sign    Days of Exercise per Week: Not on file    Minutes of Exercise per Session: 0 min  Stress: No Stress Concern Present (02/18/2018)   [place]    Feeling of Stress : Not at all  Social Connections: Unknown (09/15/2019)   Social Connection and Isolation Panel [NHANES]    Frequency of Communication with Friends and Family: Not on file    Frequency of Social Gatherings with Friends and Family: Never    Attends Religious Services: Not on file    Active Member of Clubs or Organizations: Not on file    Attends Archivist Meetings: Not on file  Marital Status: Not on file  Intimate Partner Violence: Not At Risk (02/18/2018)   Humiliation, Afraid, Rape, and Kick questionnaire    Fear of Current or Ex-Partner: No    Emotionally Abused: No    Physically Abused: No    Sexually Abused: No   Family History  Problem Relation Age of Onset   Cancer Mother    Colon cancer Neg Hx       VITAL SIGNS BP 113/72   Pulse 78   Temp (!) 97 F (36.1 C)   Resp 20   Ht '5\' 8"'$  (1.727 m)   Wt 202 lb (91.6 kg)   SpO2 97%   BMI 30.71 kg/m   Outpatient Encounter Medications as of 10/06/2022  Medication Sig Note   aspirin EC 81 MG tablet Take 81 mg by mouth daily. 0900    atorvastatin (LIPITOR) 20 MG tablet Take 20 mg by mouth at bedtime. 2000    Insulin Glargine (BASAGLAR KWIKPEN) 100 UNIT/ML Inject 8 Units into the skin at bedtime. 2100 DO NOT HOLD this insulin without provider notification 10/08/2022: Gave 4 units last night.    Insulin Pen Needle (BD AUTOSHIELD DUO) 30G X 5 MM MISC by Does not apply route. 3/16"    levothyroxine (SYNTHROID) 125 MCG tablet Take 125 mcg by mouth at bedtime. 2100    midodrine (PROAMATINE) 10 MG tablet Take 10 mg by mouth See admin instructions. Send med with resident on   Tues/Thurs/Sat to dialysis. do not give at [facility], give to resident to take with him. send with '5mg'$  to equal '15mg'$ . 6:00    midodrine (PROAMATINE) 5 MG tablet Take 5 mg by mouth See admin instructions. Send with resident on dialysis days.  Tues/Thurs/Sat  Take along with 10 mg to equal 15 mg 6:00    NON FORMULARY Diet: NAS, Cons CHO    omega-3 acid ethyl esters (LOVAZA) 1 g capsule Take 2 g by mouth at bedtime. 2100    omeprazole (PRILOSEC) 40 MG capsule Take 40 mg by mouth daily. 0900    polyethylene glycol (MIRALAX / GLYCOLAX) packet Take 17 g by mouth daily as needed for mild constipation.     sevelamer carbonate (RENVELA) 800 MG tablet Take 3 tablets (2,400 mg total) by mouth 3 (three) times daily with meals.    tamsulosin (FLOMAX) 0.4 MG CAPS capsule Take 0.4 mg by mouth daily at 6 PM. Give 30 minutes after a meal. Do not crush or chew    torsemide (DEMADEX) 20 MG tablet Take 20 mg by mouth as directed. Sunday, Monday, Wednesday, and Friday    vitamin B-12 (CYANOCOBALAMIN) 1000 MCG tablet Take 1,000 mcg by mouth daily.    [DISCONTINUED] insulin aspart (NOVOLOG) 100 UNIT/ML injection -6 Units, Subcutaneous, 3 times daily with meals CBG < 70: Implement Hypoglycemia measures CBG 70 - 120: 0 units CBG 121 - 150: 0 units CBG 151 - 200: 1 unit CBG 201-250: 2 units CBG 251-300: 3 units CBG 301-350: 4 units CBG 351-400: 5 units CBG > 400: Give 6 units and call MD    No facility-administered encounter medications on file as of 10/06/2022.     SIGNIFICANT DIAGNOSTIC EXAMS  PREVIOUS   12-30-21: ct of abdomen and pelvis:  1. No significant change in size or appearance of the hypodense lesion in the head of the pancreas, possible cyst or IPMN. Recommend continued follow-up with contrast-enhanced MRI or CT in 2 years. 2. Cholelithiasis. 3. Left renal calculus. Bilateral renal mild atrophy  with multiple renal cysts. 4. Other chronic findings as described.  06-11-22: dexa: t score: -2.052  NO NEW  EXAMS.    LABS REVIEWED PREVIOUS;    11-07-21: wbc 4,5 hgb 9.5; hct 29.5; mcv 103.9 plt 107; glucose 129; bun 28; creat 9.01; k+ 4.5; na++ 138; ca 8.6; GFR 6; liver normal albumin 3.1; hgb a1c 5.8; chol 60 ldl 18; trig 71; hdl 28; tsh 1.538  11-22-21: hgb 9,3; hct 28.4; iron 80; tibc 203; ferritin 474; vitamin B 12: 272 folate 6.6  12-02-21: hgb 10.5; hct 31.0; glucose 85; bun 25; creat 9.80; k+ 3.9; na++ 140;   01-28-22; wbc 4.8; hgb 8.7; hct 26.0; mcv 105.3; plt 113; glucose 1; bun 40; creat 11.35; k+ 3.8; na++ 141; ca 8.8; GFR 4 d-dimer 0.43; CRP <0.5  06-19-22: hgb a1c 5.8; tsh 2.539 free t4: 0.98  TODAY  09-29-22: wbc 4.3; hgb 12.9; hct 41.1; mcv 108.4 plt 108; glucose 112; bun 44; creat 16.87; k+ 4.7; na++ 139; ca 9.3; gfr 3; protein 7.0; albumin 3.5 hgb A1c 5.0; tsh 5.861; chol 89; ldl 41; trig 63; hdl 35 10-01-22: wbc 4.0; hgb 12.5; hct 39.0; mcv 108.0; plt 97; glucose 97; bun 61; creat 21.78; k+ 5.3; na++ 139; ca 9.3; gfr 2 10-03-22; wbc 3.0; hgb 12.4 hct 37.5; mcv 105.6 plt 81; glucose 99; bun 73; creat 6.4; na++ 137; ca 8.9; gfr 2 phos 8.4; albumin 3.1   Review of Systems  Constitutional:  Negative for malaise/fatigue.  Respiratory:  Negative for cough and shortness of breath.   Cardiovascular:  Negative for chest pain, palpitations and leg swelling.  Gastrointestinal:  Negative for abdominal pain, constipation and heartburn.  Musculoskeletal:  Negative for back pain, joint pain and myalgias.  Skin: Negative.   Neurological:  Negative for dizziness.  Psychiatric/Behavioral:  The patient is not nervous/anxious.     Physical Exam Constitutional:      General: He is not in acute distress.    Appearance: He is well-developed. He is not diaphoretic.  Neck:     Thyroid: No thyromegaly.  Cardiovascular:     Rate and Rhythm: Normal rate and regular rhythm.     Pulses: Normal pulses.     Heart sounds: Normal heart sounds.  Pulmonary:     Effort: Pulmonary effort is normal. No  respiratory distress.     Breath sounds: Normal breath sounds.  Abdominal:     General: Bowel sounds are normal. There is no distension.     Palpations: Abdomen is soft.     Tenderness: There is no abdominal tenderness.  Musculoskeletal:        General: Normal range of motion.     Cervical back: Neck supple.     Right lower leg: No edema.     Left lower leg: No edema.  Lymphadenopathy:     Cervical: No cervical adenopathy.  Skin:    General: Skin is warm and dry.     Comments: Chest wall dialysis access Left upper arm AV fistula  Neurological:     Mental Status: He is alert. Mental status is at baseline.  Psychiatric:        Mood and Affect: Mood normal.      ASSESSMENT/ PLAN:   TODAY  Type 2 diabetes mellitus with hypertension and end stage renal disease on hemodialysis: hgb a1c 5.8 will continue basaglar 8 units nightly is on  asa and statin   2. Thrombocytopenia: platelet 81  3. Aortic atherosclerosis (ct 12-30-21) is on  asa statin   4. 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 He has required a chest wall access for dialysis; k+ level remains elevated; left arm fistula nonfunctional    5. Other specified hypthyroidism: tsh 2.539 will continue synthroid 125 mcg daily   6. 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 Major depression single episode, moderate: is currently off medications  8. Osteopenia; t score -2.052 is on supplements  9. GERD without esophagitis: will continue prilosec 40 mg daily   10. History of colon cancer: s/p colectomy last seen by GI on 12-19-21; ct scan done 12-30-21.   11. Dyslipidemia associated with type 2 diabetes mellitus: LDL 64 trig 319 will continue lipitor 20 mg daily lovaza 2 gm daily   12. Vitamin B 12  deficiency; is on daily supplement  13. Secondary hyperparathyroidism of renal origin: will monitor  14. Bilateral lower extremity edema: will continue demedex 20 mg 4 times weekly     Ok Edwards NP [facility]   call [phone]

## 2022-10-06 NOTE — Telephone Encounter (Signed)
Patient in SNF now. Seeing providers there. Juanda Crumble, [place] Nurse Health Advisor Direct Dial [phone]

## 2022-10-07 DIAGNOSIS — Z992 Dependence on renal dialysis: Secondary | ICD-10-CM | POA: Diagnosis not present

## 2022-10-07 DIAGNOSIS — N186 End stage renal disease: Secondary | ICD-10-CM | POA: Diagnosis not present

## 2022-10-07 DIAGNOSIS — D509 Iron deficiency anemia, unspecified: Secondary | ICD-10-CM | POA: Diagnosis not present

## 2022-10-07 DIAGNOSIS — N2581 Secondary hyperparathyroidism of renal origin: Secondary | ICD-10-CM | POA: Diagnosis not present

## 2022-10-07 DIAGNOSIS — D631 Anemia in chronic kidney disease: Secondary | ICD-10-CM | POA: Diagnosis not present

## 2022-10-08 ENCOUNTER — Ambulatory Visit (HOSPITAL_COMMUNITY)
Admission: RE | Admit: 2022-10-08 | Discharge: 2022-10-08 | Disposition: A | Discharge status: Home / Self Care | Payer: Medicare Other | Attending: Vascular Surgery | Admitting: Vascular Surgery

## 2022-10-08 ENCOUNTER — Encounter (HOSPITAL_COMMUNITY)
Admission: RE | Disposition: A | Discharge status: Home / Self Care | Payer: Self-pay | Source: Home / Self Care | Attending: Vascular Surgery

## 2022-10-08 ENCOUNTER — Encounter (HOSPITAL_COMMUNITY): Payer: Self-pay | Admitting: Vascular Surgery

## 2022-10-08 DIAGNOSIS — T82858A Stenosis of vascular prosthetic devices, implants and grafts, initial encounter: Secondary | ICD-10-CM | POA: Diagnosis not present

## 2022-10-08 DIAGNOSIS — N185 Chronic kidney disease, stage 5: Secondary | ICD-10-CM | POA: Diagnosis not present

## 2022-10-08 DIAGNOSIS — N186 End stage renal disease: Secondary | ICD-10-CM | POA: Diagnosis not present

## 2022-10-08 DIAGNOSIS — Z992 Dependence on renal dialysis: Secondary | ICD-10-CM | POA: Insufficient documentation

## 2022-10-08 DIAGNOSIS — T82590D Other mechanical complication of surgically created arteriovenous fistula, subsequent encounter: Secondary | ICD-10-CM

## 2022-10-08 DIAGNOSIS — Y832 Surgical operation with anastomosis, bypass or graft as the cause of abnormal reaction of the patient, or of later complication, without mention of misadventure at the time of the procedure: Secondary | ICD-10-CM | POA: Diagnosis not present

## 2022-10-08 DIAGNOSIS — T82898A Other specified complication of vascular prosthetic devices, implants and grafts, initial encounter: Secondary | ICD-10-CM | POA: Diagnosis not present

## 2022-10-08 HISTORY — PX: PERIPHERAL VASCULAR INTERVENTION: CATH118257

## 2022-10-08 HISTORY — PX: PERIPHERAL VASCULAR BALLOON ANGIOPLASTY: CATH118281

## 2022-10-08 HISTORY — PX: A/V FISTULAGRAM: CATH118298

## 2022-10-08 LAB — POCT I-STAT, CHEM 8
BUN: 22 mg/dL (ref 8–23)
Calcium, Ion: 1.14 mmol/L — ABNORMAL LOW (ref 1.15–1.40)
Chloride: 104 mmol/L (ref 98–111)
Creatinine, Ser: 12.3 mg/dL — ABNORMAL HIGH (ref 0.61–1.24)
Glucose, Bld: 101 mg/dL — ABNORMAL HIGH (ref 70–99)
HCT: 40 % (ref 39.0–52.0)
Hemoglobin: 13.6 g/dL (ref 13.0–17.0)
Potassium: 5.2 mmol/L — ABNORMAL HIGH (ref 3.5–5.1)
Sodium: 141 mmol/L (ref 135–145)
TCO2: 29 mmol/L (ref 22–32)

## 2022-10-08 LAB — GLUCOSE, CAPILLARY: Glucose-Capillary: 88 mg/dL (ref 70–99)

## 2022-10-08 SURGERY — A/V FISTULAGRAM
Anesthesia: LOCAL | Laterality: Left

## 2022-10-08 MED ORDER — ONDANSETRON HCL 4 MG/2ML IJ SOLN: 4.0000 mg | Freq: Four times a day (QID) | INTRAMUSCULAR | Status: DC | PRN

## 2022-10-08 MED ORDER — HEPARIN SODIUM (PORCINE) 1000 UNIT/ML IJ SOLN
INTRAMUSCULAR | Status: DC | PRN
  Administered 2022-10-08: 3000 [IU] via INTRAVENOUS

## 2022-10-08 MED ORDER — SODIUM CHLORIDE 0.9 % WEIGHT BASED INFUSION: 1.0000 mL/kg/h | INTRAVENOUS | Status: DC

## 2022-10-08 MED ORDER — SODIUM CHLORIDE 0.9% FLUSH: 3.0000 mL | INTRAVENOUS | Status: DC | PRN

## 2022-10-08 MED ORDER — HYDRALAZINE HCL 20 MG/ML IJ SOLN: 5.0000 mg | INTRAMUSCULAR | Status: DC | PRN

## 2022-10-08 MED ORDER — FENTANYL CITRATE (PF) 100 MCG/2ML IJ SOLN
INTRAMUSCULAR | Status: DC | PRN
  Administered 2022-10-08: 50 ug via INTRAVENOUS

## 2022-10-08 MED ORDER — LIDOCAINE HCL (PF) 1 % IJ SOLN
INTRAMUSCULAR | Status: DC | PRN
  Administered 2022-10-08: 10 mL

## 2022-10-08 MED ORDER — FENTANYL CITRATE (PF) 100 MCG/2ML IJ SOLN
INTRAMUSCULAR | Status: AC
  Filled 2022-10-08: qty 2

## 2022-10-08 MED ORDER — ACETAMINOPHEN 325 MG PO TABS: 650.0000 mg | ORAL_TABLET | ORAL | Status: DC | PRN

## 2022-10-08 MED ORDER — HEPARIN (PORCINE) IN NACL 1000-0.9 UT/500ML-% IV SOLN
INTRAVENOUS | Status: DC | PRN
  Administered 2022-10-08: 1000 mL

## 2022-10-08 MED ORDER — HEPARIN SODIUM (PORCINE) 1000 UNIT/ML IJ SOLN
INTRAMUSCULAR | Status: AC
  Filled 2022-10-08: qty 10

## 2022-10-08 MED ORDER — LIDOCAINE HCL (PF) 1 % IJ SOLN
INTRAMUSCULAR | Status: AC
  Filled 2022-10-08: qty 30

## 2022-10-08 MED ORDER — SODIUM CHLORIDE 0.9 % IV SOLN: 250.0000 mL | INTRAVENOUS | Status: DC | PRN

## 2022-10-08 MED ORDER — IODIXANOL 320 MG/ML IV SOLN
INTRAVENOUS | Status: DC | PRN
  Administered 2022-10-08: 70 mL

## 2022-10-08 MED ORDER — LABETALOL HCL 5 MG/ML IV SOLN: 10.0000 mg | INTRAVENOUS | Status: DC | PRN

## 2022-10-08 MED ORDER — MIDAZOLAM HCL 2 MG/2ML IJ SOLN
INTRAMUSCULAR | Status: DC | PRN
  Administered 2022-10-08 (×2): 1 mg via INTRAVENOUS

## 2022-10-08 MED ORDER — MIDAZOLAM HCL 2 MG/2ML IJ SOLN
INTRAMUSCULAR | Status: AC
  Filled 2022-10-08: qty 2

## 2022-10-08 MED ORDER — HEPARIN (PORCINE) IN NACL 1000-0.9 UT/500ML-% IV SOLN
INTRAVENOUS | Status: AC
  Filled 2022-10-08: qty 500

## 2022-10-08 MED ORDER — SODIUM CHLORIDE 0.9% FLUSH: 3.0000 mL | Freq: Two times a day (BID) | INTRAVENOUS | Status: DC

## 2022-10-08 SURGICAL SUPPLY — 26 items
BAG SNAP BAND KOVER 36X36 (MISCELLANEOUS) ×2 IMPLANT
BALLN MUSTANG 6X60X75 (BALLOONS) ×2
BALLN MUSTANG 8X60X75 (BALLOONS) ×2
BALLN STERLING OTW 3X20X150 (BALLOONS) ×2
BALLOON MUSTANG 6X60X75 (BALLOONS) IMPLANT
BALLOON MUSTANG 8X60X75 (BALLOONS) IMPLANT
BALLOON STERLING OTW 3X20X150 (BALLOONS) IMPLANT
CATH NAVICROSS ST 65CM (CATHETERS) IMPLANT
CATHETER NAVICROSS ST 65CM (CATHETERS) ×2
COVER DOME SNAP 22 D (MISCELLANEOUS) ×2 IMPLANT
DEVICE RAD COMP TR BAND LRG (VASCULAR PRODUCTS) IMPLANT
KIT ENCORE 26 ADVANTAGE (KITS) IMPLANT
KIT MICROPUNCTURE NIT STIFF (SHEATH) IMPLANT
PROTECTION STATION PRESSURIZED (MISCELLANEOUS) ×4
SHEATH GLIDE SLENDER 4/5FR (SHEATH) IMPLANT
SHEATH PINNACLE R/O II 5F 6CM (SHEATH) IMPLANT
SHEATH PINNACLE R/O II 7F 4CM (SHEATH) IMPLANT
SHEATH PROBE COVER 6X72 (BAG) ×2 IMPLANT
STATION PROTECTION PRESSURIZED (MISCELLANEOUS) ×2 IMPLANT
STENT VIABAHN 7X50X120 (Permanent Stent) ×2 IMPLANT
STENT VIABAHN 7X5X120 7FR (Permanent Stent) IMPLANT
STOPCOCK MORSE 400PSI 3WAY (MISCELLANEOUS) ×2 IMPLANT
TRAY PV CATH (CUSTOM PROCEDURE TRAY) ×2 IMPLANT
TUBING CIL FLEX 10 FLL-RA (TUBING) ×2 IMPLANT
WIRE BENTSON .035X145CM (WIRE) IMPLANT
WIRE G V18X300CM (WIRE) IMPLANT

## 2022-10-08 NOTE — Progress Notes (Signed)
Discharge instructions was given to patient and Education officer, museum. Both verbalized understanding.

## 2022-10-08 NOTE — Progress Notes (Signed)
Notified MD of needing Med rec. Completed.

## 2022-10-08 NOTE — Op Note (Signed)
    NAME: Zachary [name]    MRN: [id number] DOB: 1946-07-30    DATE OF OPERATION: 10/08/2022  PREOP DIAGNOSIS:    End-stage renal disease with malfunctioning left brachiocephalic fistula  POSTOP DIAGNOSIS:    Same  PROCEDURE:    Ultrasound-guided micropuncture access of the left arm brachiocephalic fistula Fistulogram 7 mm x 5 cm Gore Viabahn stenting of the fistula at the mid humerus Ultrasound-guided micropuncture access of the left radial artery in retrograde fashion 3 mm x 20 mm balloon angioplasty of the proximal anastomosis  SURGEON: Broadus John  ASSIST: None  ANESTHESIA: Moderate   INDICATIONS:    Zachary [name] is a 76 y.o. male with end-stage renal disease who has had a malfunctioning left brachiocephalic fistula.  He is currently being dialyzed through a tunneled HD line.  FINDINGS:   Focal, 99% stenosis 5 mm distal to the arterial anastomosis 4 cm, tapering 99% stenosis at the mid humerus No central stenosis  TECHNIQUE:   Patient was brought to the Cath Lab laid in supine position.  A timeout was performed.  He was prepped and draped in standard fashion and moderate anesthesia was induced.  The case began with local anesthetic injected 2 cm from the arterial anastomosis.  The fistula was accessed under ultrasound guidance.  Fistulogram followed through micropuncture sheath.  See results above.  I elected to intervene on the mid humerus and and to the anastomotic lesion.  The patient was heparinized with 3000 units IV heparin and to the diagnostic sheath was exchanged for a 7 [place] intervention sheath.  Being that the lesion was so stenotic, my concern with primary balloon angioplasty was rupture.  I elected to use a 7 mm x 5 cm Viabahn stent which was laid across the lesion.  This was postdilated with a 6 mm x 60 mm balloon.  Follow-up angiography demonstrated excellent result with resolution of flow-limiting stenosis.  Next, my attention turned to the  anastomotic stenosis.  I elected to use an ultrasound-guided micropuncture needle to access the left radial artery in retrograde fashion.  From this lesion, a wire was run into the fistula, across the lesion.  Next, a 3 mm x 20 mm balloon was inflated to profile for 2 minutes.  Follow-up angiography demonstrated improvement, however there was residual stenosis.  Therefore, I used the same balloon to 16 atm in an effort to increase the diameter to 3.2 mm.  Follow-up angiography demonstrated excellent result with resolution of flow-limiting stenosis.  At case completion, the patient had a palpable thrill within his fistula.  The fistula access site was controlled with a Monocryl suture.  The left radial access site was controlled with the use of a TR band.  Impression: Successful balloon angioplasty and stenting of left-sided brachiocephalic fistula to improve flow for dialysis.  The fistula can be accessed.  Macie Burows, MD [facility] DATE OF DICTATION:   10/08/2022

## 2022-10-08 NOTE — H&P (Signed)
[facility] [place] PROGRESS NOTE  Patient seen and examined in preop holding.  No complaints. No changes to medication history or physical exam since last seen in clinic. After discussing the risks and benefits of left arm fistulagram, Zachary [name] elected to proceed.   Broadus John MD   ASSESSMENT / PLAN: Zachary [name] is a 76 y.o. male with malfunctioning left arm arteriovenous fistula. TDC placed today. Plan revision of left arm arteriovenous fistula as outpatient. Likely next week. Please call for questions.  SUBJECTIVE: No complaints. TDC placed today.  OBJECTIVE: BP 135/86   Pulse 88   Temp 98.4 F (36.9 C) (Oral)   Resp 11   Ht '5\' 8"'$  (1.727 m)   Wt 91.6 kg   SpO2 100%   BMI 30.71 kg/m  No intake or output data in the 24 hours ending 10/08/22 1447  No acute distress Regular rate and rhythm Unlabored breathing Weak thrill in left arm fistula     Latest Ref Rng & Units 10/08/2022    8:03 AM 10/03/2022    6:05 AM 10/01/2022    4:11 PM  CBC  WBC 4.0 - 10.5 K/uL  3.0  4.0   Hemoglobin 13.0 - 17.0 g/dL 13.6  12.4  12.5   Hematocrit 39.0 - 52.0 % 40.0  37.5  39.0   Platelets 150 - 400 K/uL  81  97         Latest Ref Rng & Units 10/08/2022    8:03 AM 10/03/2022    6:05 AM 10/02/2022    7:07 PM  CMP  Glucose 70 - 99 mg/dL 101  99  73   BUN 8 - 23 mg/dL 22  73  70   Creatinine 0.61 - 1.24 mg/dL 12.30  24.77  24.28   Sodium 135 - 145 mmol/L 141  137  140   Potassium 3.5 - 5.1 mmol/L 5.2  6.4  6.2   Chloride 98 - 111 mmol/L 104  107  109   CO2 22 - 32 mmol/L  16  19   Calcium 8.9 - 10.3 mg/dL  8.9  9.4     Estimated Creatinine Clearance: 5.8 mL/min (A) (by C-G formula based on SCr of 12.3 mg/dL (H)).  Yevonne Aline. Stanford Breed, MD [facility] Phone Number: [phone] 10/08/2022 2:47 PM

## 2022-10-09 ENCOUNTER — Encounter (HOSPITAL_COMMUNITY): Payer: Self-pay | Admitting: Vascular Surgery

## 2022-10-09 DIAGNOSIS — D509 Iron deficiency anemia, unspecified: Secondary | ICD-10-CM | POA: Diagnosis not present

## 2022-10-09 DIAGNOSIS — Z992 Dependence on renal dialysis: Secondary | ICD-10-CM | POA: Diagnosis not present

## 2022-10-09 DIAGNOSIS — N2581 Secondary hyperparathyroidism of renal origin: Secondary | ICD-10-CM | POA: Diagnosis not present

## 2022-10-09 DIAGNOSIS — D631 Anemia in chronic kidney disease: Secondary | ICD-10-CM | POA: Diagnosis not present

## 2022-10-09 DIAGNOSIS — N186 End stage renal disease: Secondary | ICD-10-CM | POA: Diagnosis not present

## 2022-10-11 DIAGNOSIS — Z992 Dependence on renal dialysis: Secondary | ICD-10-CM | POA: Diagnosis not present

## 2022-10-11 DIAGNOSIS — N186 End stage renal disease: Secondary | ICD-10-CM | POA: Diagnosis not present

## 2022-10-11 DIAGNOSIS — D631 Anemia in chronic kidney disease: Secondary | ICD-10-CM | POA: Diagnosis not present

## 2022-10-11 DIAGNOSIS — N2581 Secondary hyperparathyroidism of renal origin: Secondary | ICD-10-CM | POA: Diagnosis not present

## 2022-10-11 DIAGNOSIS — D509 Iron deficiency anemia, unspecified: Secondary | ICD-10-CM | POA: Diagnosis not present

## 2022-10-14 DIAGNOSIS — Z992 Dependence on renal dialysis: Secondary | ICD-10-CM | POA: Diagnosis not present

## 2022-10-14 DIAGNOSIS — N2581 Secondary hyperparathyroidism of renal origin: Secondary | ICD-10-CM | POA: Diagnosis not present

## 2022-10-14 DIAGNOSIS — D509 Iron deficiency anemia, unspecified: Secondary | ICD-10-CM | POA: Diagnosis not present

## 2022-10-14 DIAGNOSIS — N186 End stage renal disease: Secondary | ICD-10-CM | POA: Diagnosis not present

## 2022-10-14 DIAGNOSIS — D631 Anemia in chronic kidney disease: Secondary | ICD-10-CM | POA: Diagnosis not present

## 2022-10-15 ENCOUNTER — Encounter: Payer: Self-pay | Admitting: Vascular Surgery

## 2022-10-15 ENCOUNTER — Ambulatory Visit (INDEPENDENT_AMBULATORY_CARE_PROVIDER_SITE_OTHER): Payer: Medicare Other | Admitting: Vascular Surgery

## 2022-10-15 VITALS — BP 169/82 | HR 81 | Temp 97.0°F | Ht 68.0 in | Wt 202.0 lb

## 2022-10-15 DIAGNOSIS — Z992 Dependence on renal dialysis: Secondary | ICD-10-CM

## 2022-10-15 DIAGNOSIS — N186 End stage renal disease: Secondary | ICD-10-CM

## 2022-10-15 NOTE — Progress Notes (Signed)
[facility]ist of [place]  Patient name: Zachary [name] MRN: [id number] DOB: 08/10/1946 Sex: male  REASON FOR VISIT: Today for follow-up of his left upper arm AV fistula  HPI: Zachary [name] is a 76 y.o. male here for follow-up.  He had initial for stage basilic vein fistula by myself on 05/28/2021 and a second stage transposition on 07/30/2021.  He recently was having difficulty with access and on 08/08/2022 underwent fistulogram and Viabahn placement high-grade stenosis in the midportion of the fistula.  Also had plain angioplasty near the brachial artery to basilic vein anastomosis.  Past Medical History:  Diagnosis Date   Abnormal CT scan, kidney 10/06/2011   Acute pyelonephritis 10/07/2011   Anemia    normocytic   Anxiety    mental retardation   Bladder wall thickening 10/06/2011   BPH (benign prostatic hypertrophy)    Diabetes mellitus    Dialysis patient ([facility])    Tuesday, Thursday and Saturday,    DVT of leg (deep venous thrombosis) ([facility]) 12/25/2016   Edema     history of lower extremity edema   GERD (gastroesophageal reflux disease)    Heme positive stool    Hydronephrosis    Hyperkalemia    Hyperlipidemia    Hypernatremia    Hypertension    Hypothyroidism    Impaired speech    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HCC)    MR (mental retardation)    Muscle weakness    Obstructive uropathy    Perinephric abscess 10/07/2011   Poor historian poor historian   Primary colorectal adenocarcinoma ([place]) 04/2017   S/p total colectomy   Protein calorie malnutrition ([place])    Pyelonephritis    Renal failure (ARF), acute on chronic (HCC)    Renal insufficiency    chronic history   Sepsis ([place])    Smoking    Uremia    Urinary retention    UTI (lower urinary tract infection) 10/06/2011    Family History  Problem Relation Age of Onset   Cancer Mother    Colon cancer Neg Hx     SOCIAL HISTORY: Social History    Tobacco Use   Smoking status: Never   Smokeless tobacco: Never  Substance Use Topics   Alcohol use: No    No Known Allergies  Current Outpatient Medications  Medication Sig Dispense Refill   aspirin EC 81 MG tablet Take 81 mg by mouth daily. 0900     atorvastatin (LIPITOR) 20 MG tablet Take 20 mg by mouth at bedtime. 2000     Insulin Glargine (BASAGLAR KWIKPEN) 100 UNIT/ML Inject 8 Units into the skin at bedtime. 2100 DO NOT HOLD this insulin without provider notification     levothyroxine (SYNTHROID) 125 MCG tablet Take 125 mcg by mouth at bedtime. 2100     midodrine (PROAMATINE) 10 MG tablet Take 10 mg by mouth See admin instructions. Send med with resident on  Tues/Thurs/Sat to dialysis. do not give at [facility], give to resident to take with him. send with '5mg'$  to equal '15mg'$ . 6:00     midodrine (PROAMATINE) 5 MG tablet Take 5 mg by mouth See admin instructions. Send with resident on dialysis days.  Tues/Thurs/Sat  Take along with 10 mg to equal 15 mg 6:00     omega-3 acid ethyl esters (LOVAZA) 1 g capsule Take 2 g by mouth at bedtime. 2100     omeprazole (PRILOSEC) 40 MG capsule Take 40 mg by mouth daily. 0900  polyethylene glycol (MIRALAX / GLYCOLAX) packet Take 17 g by mouth daily as needed for mild constipation.      sevelamer carbonate (RENVELA) 800 MG tablet Take 3 tablets (2,400 mg total) by mouth 3 (three) times daily with meals.     tamsulosin (FLOMAX) 0.4 MG CAPS capsule Take 0.4 mg by mouth daily at 6 PM. Give 30 minutes after a meal. Do not crush or [name]     torsemide (DEMADEX) 20 MG tablet Take 20 mg by mouth as directed. Sunday, Monday, Wednesday, and Friday     vitamin B-12 (CYANOCOBALAMIN) 1000 MCG tablet Take 1,000 mcg by mouth daily.     Insulin Pen Needle (BD AUTOSHIELD DUO) 30G X 5 MM MISC by Does not apply route. 3/16"     NON FORMULARY Diet: NAS, Cons CHO     Current Facility-Administered Medications  Medication Dose Route Frequency Provider Last Rate Last  Admin   0.9 %  sodium chloride infusion  250 mL Intravenous PRN Broadus John, MD       sodium chloride flush (NS) 0.9 % injection 3 mL  3 mL Intravenous Q12H Broadus John, MD        REVIEW OF SYSTEMS:  '[X]'$  denotes positive finding, '[ ]'$  denotes negative finding Cardiac  Comments:  Chest pain or chest pressure:    Shortness of breath upon exertion:    Short of breath when lying flat:    Irregular heart rhythm:        Vascular    Pain in calf, thigh, or hip brought on by ambulation:    Pain in feet at night that wakes you up from your sleep:     Blood clot in your veins:    Leg swelling:           PHYSICAL EXAM: Vitals:   10/15/22 1500  BP: (!) 169/82  Pulse: 81  Temp: (!) 97 F (36.1 C)  TempSrc: Temporal  SpO2: 97%  Weight: 202 lb (91.6 kg)  Height: '5\' 8"'$  (1.727 m)    GENERAL: The patient is a well-nourished male, in no acute distress. The vital signs are documented above. CARDIOVASCULAR: Excellent thrill in his left upper arm AV fistula.  Well-perfused left hand. PULMONARY: There is good air exchange  MUSCULOSKELETAL: There are no major deformities or cyanosis. NEUROLOGIC: No focal weakness or paresthesias are detected. SKIN: There are no ulcers or rashes noted. PSYCHIATRIC: The patient has a normal affect.  DATA:  None  MEDICAL ISSUES: I discussed his case with Blenda Nicely, PA with [facility] to assure that there was no difficulty at the hemodialysis center.  She spoke with [facility] in [place] and confirmed that this appears to be working well after the intervention on 10/08/2022.  He will continue use of his fistula.  He does have a catheter and if he has successful use over several sessions can have his catheter removed.    Rosetta Posner, MD FACS [facility] [phone]  Note: Portions of this report may have been transcribed using voice recognition software.  Every effort has  been made to ensure accuracy; however, inadvertent computerized transcription errors may still be present.

## 2022-10-16 DIAGNOSIS — Z992 Dependence on renal dialysis: Secondary | ICD-10-CM | POA: Diagnosis not present

## 2022-10-16 DIAGNOSIS — D631 Anemia in chronic kidney disease: Secondary | ICD-10-CM | POA: Diagnosis not present

## 2022-10-16 DIAGNOSIS — D509 Iron deficiency anemia, unspecified: Secondary | ICD-10-CM | POA: Diagnosis not present

## 2022-10-16 DIAGNOSIS — N2581 Secondary hyperparathyroidism of renal origin: Secondary | ICD-10-CM | POA: Diagnosis not present

## 2022-10-16 DIAGNOSIS — N186 End stage renal disease: Secondary | ICD-10-CM | POA: Diagnosis not present

## 2022-10-18 DIAGNOSIS — Z992 Dependence on renal dialysis: Secondary | ICD-10-CM | POA: Diagnosis not present

## 2022-10-18 DIAGNOSIS — D631 Anemia in chronic kidney disease: Secondary | ICD-10-CM | POA: Diagnosis not present

## 2022-10-18 DIAGNOSIS — N186 End stage renal disease: Secondary | ICD-10-CM | POA: Diagnosis not present

## 2022-10-18 DIAGNOSIS — D509 Iron deficiency anemia, unspecified: Secondary | ICD-10-CM | POA: Diagnosis not present

## 2022-10-18 DIAGNOSIS — N2581 Secondary hyperparathyroidism of renal origin: Secondary | ICD-10-CM | POA: Diagnosis not present

## 2022-10-21 DIAGNOSIS — N186 End stage renal disease: Secondary | ICD-10-CM | POA: Diagnosis not present

## 2022-10-21 DIAGNOSIS — N2581 Secondary hyperparathyroidism of renal origin: Secondary | ICD-10-CM | POA: Diagnosis not present

## 2022-10-21 DIAGNOSIS — D509 Iron deficiency anemia, unspecified: Secondary | ICD-10-CM | POA: Diagnosis not present

## 2022-10-21 DIAGNOSIS — D631 Anemia in chronic kidney disease: Secondary | ICD-10-CM | POA: Diagnosis not present

## 2022-10-21 DIAGNOSIS — Z992 Dependence on renal dialysis: Secondary | ICD-10-CM | POA: Diagnosis not present

## 2022-10-23 DIAGNOSIS — N2581 Secondary hyperparathyroidism of renal origin: Secondary | ICD-10-CM | POA: Diagnosis not present

## 2022-10-23 DIAGNOSIS — N186 End stage renal disease: Secondary | ICD-10-CM | POA: Diagnosis not present

## 2022-10-23 DIAGNOSIS — Z992 Dependence on renal dialysis: Secondary | ICD-10-CM | POA: Diagnosis not present

## 2022-10-23 DIAGNOSIS — D509 Iron deficiency anemia, unspecified: Secondary | ICD-10-CM | POA: Diagnosis not present

## 2022-10-23 DIAGNOSIS — D631 Anemia in chronic kidney disease: Secondary | ICD-10-CM | POA: Diagnosis not present

## 2022-10-25 DIAGNOSIS — D509 Iron deficiency anemia, unspecified: Secondary | ICD-10-CM | POA: Diagnosis not present

## 2022-10-25 DIAGNOSIS — N2581 Secondary hyperparathyroidism of renal origin: Secondary | ICD-10-CM | POA: Diagnosis not present

## 2022-10-25 DIAGNOSIS — D631 Anemia in chronic kidney disease: Secondary | ICD-10-CM | POA: Diagnosis not present

## 2022-10-25 DIAGNOSIS — Z992 Dependence on renal dialysis: Secondary | ICD-10-CM | POA: Diagnosis not present

## 2022-10-25 DIAGNOSIS — N186 End stage renal disease: Secondary | ICD-10-CM | POA: Diagnosis not present

## 2022-10-26 DIAGNOSIS — M6281 Muscle weakness (generalized): Secondary | ICD-10-CM | POA: Diagnosis not present

## 2022-10-26 DIAGNOSIS — I120 Hypertensive chronic kidney disease with stage 5 chronic kidney disease or end stage renal disease: Secondary | ICD-10-CM | POA: Diagnosis not present

## 2022-10-26 DIAGNOSIS — N186 End stage renal disease: Secondary | ICD-10-CM | POA: Diagnosis not present

## 2022-10-26 DIAGNOSIS — R262 Difficulty in walking, not elsewhere classified: Secondary | ICD-10-CM | POA: Diagnosis not present

## 2022-10-26 DIAGNOSIS — Z9181 History of falling: Secondary | ICD-10-CM | POA: Diagnosis not present

## 2022-10-26 DIAGNOSIS — Z992 Dependence on renal dialysis: Secondary | ICD-10-CM | POA: Diagnosis not present

## 2022-10-27 DIAGNOSIS — M6281 Muscle weakness (generalized): Secondary | ICD-10-CM | POA: Diagnosis not present

## 2022-10-27 DIAGNOSIS — Z9181 History of falling: Secondary | ICD-10-CM | POA: Diagnosis not present

## 2022-10-27 DIAGNOSIS — R262 Difficulty in walking, not elsewhere classified: Secondary | ICD-10-CM | POA: Diagnosis not present

## 2022-10-27 DIAGNOSIS — I120 Hypertensive chronic kidney disease with stage 5 chronic kidney disease or end stage renal disease: Secondary | ICD-10-CM | POA: Diagnosis not present

## 2022-10-28 DIAGNOSIS — Z992 Dependence on renal dialysis: Secondary | ICD-10-CM | POA: Diagnosis not present

## 2022-10-28 DIAGNOSIS — D509 Iron deficiency anemia, unspecified: Secondary | ICD-10-CM | POA: Diagnosis not present

## 2022-10-28 DIAGNOSIS — N2581 Secondary hyperparathyroidism of renal origin: Secondary | ICD-10-CM | POA: Diagnosis not present

## 2022-10-28 DIAGNOSIS — N250 Renal osteodystrophy: Secondary | ICD-10-CM | POA: Diagnosis not present

## 2022-10-28 DIAGNOSIS — D631 Anemia in chronic kidney disease: Secondary | ICD-10-CM | POA: Diagnosis not present

## 2022-10-28 DIAGNOSIS — N186 End stage renal disease: Secondary | ICD-10-CM | POA: Diagnosis not present

## 2022-10-30 ENCOUNTER — Encounter: Payer: Self-pay | Admitting: Internal Medicine

## 2022-10-30 ENCOUNTER — Non-Acute Institutional Stay (SKILLED_NURSING_FACILITY): Payer: Medicare Other | Admitting: Internal Medicine

## 2022-10-30 DIAGNOSIS — E11319 Type 2 diabetes mellitus with unspecified diabetic retinopathy without macular edema: Secondary | ICD-10-CM

## 2022-10-30 DIAGNOSIS — E1122 Type 2 diabetes mellitus with diabetic chronic kidney disease: Secondary | ICD-10-CM | POA: Diagnosis not present

## 2022-10-30 DIAGNOSIS — Z992 Dependence on renal dialysis: Secondary | ICD-10-CM

## 2022-10-30 DIAGNOSIS — D631 Anemia in chronic kidney disease: Secondary | ICD-10-CM | POA: Diagnosis not present

## 2022-10-30 DIAGNOSIS — Z794 Long term (current) use of insulin: Secondary | ICD-10-CM

## 2022-10-30 DIAGNOSIS — D509 Iron deficiency anemia, unspecified: Secondary | ICD-10-CM | POA: Diagnosis not present

## 2022-10-30 DIAGNOSIS — N2581 Secondary hyperparathyroidism of renal origin: Secondary | ICD-10-CM | POA: Diagnosis not present

## 2022-10-30 DIAGNOSIS — E034 Atrophy of thyroid (acquired): Secondary | ICD-10-CM

## 2022-10-30 DIAGNOSIS — N186 End stage renal disease: Secondary | ICD-10-CM

## 2022-10-30 DIAGNOSIS — N250 Renal osteodystrophy: Secondary | ICD-10-CM | POA: Diagnosis not present

## 2022-10-30 NOTE — Assessment & Plan Note (Signed)
Current H/H is 13.6/40 indicating resolution of the anemia.

## 2022-10-30 NOTE — Assessment & Plan Note (Signed)
Minimal elevation in TSH of 5.861 will be reassessed in early February.  This mild elevation does not warrant immediate concern.

## 2022-10-30 NOTE — Assessment & Plan Note (Signed)
09/29/2022 A1c was prediabetic at 5%.  Glucose average here at the SNF has been approximately 120.  No change in medications indicated.

## 2022-10-30 NOTE — Patient Instructions (Signed)
See assessment and plan under each diagnosis in the problem list and acutely for this visit 

## 2022-10-30 NOTE — Progress Notes (Signed)
NURSING HOME LOCATION:  [facility] ROOM NUMBER:  [id number]  CODE STATUS:  Full Code  PCP:  Ok Edwards NP  This is a nursing facility follow up visit of chronic medical diagnoses & to document compliance with Regulation 483.30 (c) in The [place] Manual Phase 2 which mandates caregiver visit ( visits can alternate among physician, PA or NP as per statutes) within 10 days of 30 days / 60 days/ 90 days post admission to SNF date    Interim medical record and care since last SNF visit was updated with review of diagnostic studies and change in clinical status since last visit were documented.  HPI: He is a permanent resident of this facility with diagnoses of diabetes with ESRD on hemodialysis, GERD, dyslipidemia, essential hypertension, hypothyroidism, history of colorectal adenocarcinoma, protein/caloric malnutrition, and mental retardation. He has had multiple vascular procedures related to the ESRD; AV fistulogram and peripheral vascular balloon angioplasty were performed 10/08/2022.  Total colectomy was performed in 2018.  Labs are performed 3 times a week at hemodialysis.  The most recent labs recorded in [facility] were 10/08/2022.  Creatinine was 12.30.  Despite the ESRD no anemia was present; H/H was 13.6/40.  TSH is mildly elevated at 5.861, up from 2.539 on 06/19/2022. Here at the SNF glucoses average approximately 120.  A1c was prediabetic at 5% on 09/29/2022.  He remains on low-dose basal insulin.  Review of systems: Dementia invalidated responses.  When I asked about his recent hospitalization he simply stated "my arm", pointing to the LUE vascular access site. When asked what was done his response was "cut it a little bit."  Complete review of systems resulted in monosyllabic "no" responses.  Constitutional: No fever, significant weight change, fatigue  Eyes: No redness, discharge, pain, vision change ENT/mouth: No nasal congestion,  purulent discharge,  earache, change in hearing, sore throat  Cardiovascular: No chest pain, palpitations, paroxysmal nocturnal dyspnea, claudication, edema  Respiratory: No cough, sputum production, hemoptysis, DOE, significant snoring, apnea   Gastrointestinal: No heartburn, dysphagia, abdominal pain, nausea /vomiting, rectal bleeding, melena, change in bowels Genitourinary: No dysuria, hematuria, pyuria, incontinence, nocturia Musculoskeletal: No joint stiffness, joint swelling, weakness, pain Dermatologic: No rash, pruritus, change in appearance of skin Neurologic: No dizziness, headache, syncope, seizures, numbness, tingling Psychiatric: No significant anxiety, depression, insomnia, anorexia Endocrine: No change in hair/skin/nails, excessive thirst, excessive hunger, excessive urination  Hematologic/lymphatic: No significant bruising, lymphadenopathy, abnormal bleeding Allergy/immunology: No itchy/watery eyes, significant sneezing, urticaria, angioedema  Physical exam:  Pertinent or positive findings: Facies tend to be blank.  Hair is closely cropped.  His mouth appears to sag slightly to the left.  Slight exotropia is present in the right eye.  He appears to have only 2 upper maxillary teeth.  The lower mandibular teeth are malaligned and densely plaque encrusted.  There is a suggestion of an insignificant right basilar systolic murmur.  Abdomen slightly protuberant.  Pedal pulses are not palpable.  He has nonpitting edema of the extremities.  Deep tendon reflexes are 0+.  Strength to opposition is fair-good and equal in all extremities.  There is a dressing over the left biceps.  General appearance: Adequately nourished; no acute distress, increased work of breathing is present.   Lymphatic: No lymphadenopathy about the head, neck, axilla. Eyes: No conjunctival inflammation or lid edema is present. There is no scleral icterus. Ears:  External ear exam shows no significant lesions or deformities.   Nose:   External nasal  examination shows no deformity or inflammation. Nasal mucosa are pink and moist without lesions, exudates Neck:  No thyromegaly, masses, tenderness noted.    Heart:  Normal rate and regular rhythm. S1 and S2 normal without gallop,click, rub .  Lungs: Chest clear to auscultation without wheezes, rhonchi, rales, rubs. Abdomen: Bowel sounds are normal. Abdomen is soft and nontender with no organomegaly, hernias, masses. GU: Deferred  Extremities:  No cyanosis, clubbing  Neurologic exam :Balance, [place], finger to nose testing could not be completed due to clinical state Skin: Warm & dry w/o tenting. No significant lesions or rash.  See summary under each active problem in the Problem List with associated updated therapeutic plan

## 2022-10-30 NOTE — Assessment & Plan Note (Signed)
He remains on hemodialysis 3 times a week.

## 2022-10-31 DIAGNOSIS — Z9181 History of falling: Secondary | ICD-10-CM | POA: Diagnosis not present

## 2022-10-31 DIAGNOSIS — R262 Difficulty in walking, not elsewhere classified: Secondary | ICD-10-CM | POA: Diagnosis not present

## 2022-10-31 DIAGNOSIS — I120 Hypertensive chronic kidney disease with stage 5 chronic kidney disease or end stage renal disease: Secondary | ICD-10-CM | POA: Diagnosis not present

## 2022-10-31 DIAGNOSIS — M6281 Muscle weakness (generalized): Secondary | ICD-10-CM | POA: Diagnosis not present

## 2022-11-01 DIAGNOSIS — D631 Anemia in chronic kidney disease: Secondary | ICD-10-CM | POA: Diagnosis not present

## 2022-11-01 DIAGNOSIS — D509 Iron deficiency anemia, unspecified: Secondary | ICD-10-CM | POA: Diagnosis not present

## 2022-11-01 DIAGNOSIS — N250 Renal osteodystrophy: Secondary | ICD-10-CM | POA: Diagnosis not present

## 2022-11-01 DIAGNOSIS — N2581 Secondary hyperparathyroidism of renal origin: Secondary | ICD-10-CM | POA: Diagnosis not present

## 2022-11-01 DIAGNOSIS — Z992 Dependence on renal dialysis: Secondary | ICD-10-CM | POA: Diagnosis not present

## 2022-11-01 DIAGNOSIS — N186 End stage renal disease: Secondary | ICD-10-CM | POA: Diagnosis not present

## 2022-11-03 DIAGNOSIS — E1140 Type 2 diabetes mellitus with diabetic neuropathy, unspecified: Secondary | ICD-10-CM | POA: Diagnosis not present

## 2022-11-03 DIAGNOSIS — B351 Tinea unguium: Secondary | ICD-10-CM | POA: Diagnosis not present

## 2022-11-03 DIAGNOSIS — R262 Difficulty in walking, not elsewhere classified: Secondary | ICD-10-CM | POA: Diagnosis not present

## 2022-11-03 DIAGNOSIS — M2042 Other hammer toe(s) (acquired), left foot: Secondary | ICD-10-CM | POA: Diagnosis not present

## 2022-11-03 DIAGNOSIS — Z20828 Contact with and (suspected) exposure to other viral communicable diseases: Secondary | ICD-10-CM | POA: Diagnosis not present

## 2022-11-03 DIAGNOSIS — I120 Hypertensive chronic kidney disease with stage 5 chronic kidney disease or end stage renal disease: Secondary | ICD-10-CM | POA: Diagnosis not present

## 2022-11-03 DIAGNOSIS — E1129 Type 2 diabetes mellitus with other diabetic kidney complication: Secondary | ICD-10-CM | POA: Diagnosis not present

## 2022-11-03 DIAGNOSIS — M6281 Muscle weakness (generalized): Secondary | ICD-10-CM | POA: Diagnosis not present

## 2022-11-03 DIAGNOSIS — Z1152 Encounter for screening for COVID-19: Secondary | ICD-10-CM | POA: Diagnosis not present

## 2022-11-03 DIAGNOSIS — Z9181 History of falling: Secondary | ICD-10-CM | POA: Diagnosis not present

## 2022-11-03 DIAGNOSIS — M2041 Other hammer toe(s) (acquired), right foot: Secondary | ICD-10-CM | POA: Diagnosis not present

## 2022-11-04 DIAGNOSIS — N2581 Secondary hyperparathyroidism of renal origin: Secondary | ICD-10-CM | POA: Diagnosis not present

## 2022-11-04 DIAGNOSIS — Z992 Dependence on renal dialysis: Secondary | ICD-10-CM | POA: Diagnosis not present

## 2022-11-04 DIAGNOSIS — N186 End stage renal disease: Secondary | ICD-10-CM | POA: Diagnosis not present

## 2022-11-04 DIAGNOSIS — N250 Renal osteodystrophy: Secondary | ICD-10-CM | POA: Diagnosis not present

## 2022-11-04 DIAGNOSIS — D631 Anemia in chronic kidney disease: Secondary | ICD-10-CM | POA: Diagnosis not present

## 2022-11-04 DIAGNOSIS — D509 Iron deficiency anemia, unspecified: Secondary | ICD-10-CM | POA: Diagnosis not present

## 2022-11-05 DIAGNOSIS — Z9181 History of falling: Secondary | ICD-10-CM | POA: Diagnosis not present

## 2022-11-05 DIAGNOSIS — M6281 Muscle weakness (generalized): Secondary | ICD-10-CM | POA: Diagnosis not present

## 2022-11-05 DIAGNOSIS — R262 Difficulty in walking, not elsewhere classified: Secondary | ICD-10-CM | POA: Diagnosis not present

## 2022-11-05 DIAGNOSIS — I120 Hypertensive chronic kidney disease with stage 5 chronic kidney disease or end stage renal disease: Secondary | ICD-10-CM | POA: Diagnosis not present

## 2022-11-06 DIAGNOSIS — N2581 Secondary hyperparathyroidism of renal origin: Secondary | ICD-10-CM | POA: Diagnosis not present

## 2022-11-06 DIAGNOSIS — N250 Renal osteodystrophy: Secondary | ICD-10-CM | POA: Diagnosis not present

## 2022-11-06 DIAGNOSIS — Z992 Dependence on renal dialysis: Secondary | ICD-10-CM | POA: Diagnosis not present

## 2022-11-06 DIAGNOSIS — D631 Anemia in chronic kidney disease: Secondary | ICD-10-CM | POA: Diagnosis not present

## 2022-11-06 DIAGNOSIS — N186 End stage renal disease: Secondary | ICD-10-CM | POA: Diagnosis not present

## 2022-11-06 DIAGNOSIS — D509 Iron deficiency anemia, unspecified: Secondary | ICD-10-CM | POA: Diagnosis not present

## 2022-11-07 DIAGNOSIS — M6281 Muscle weakness (generalized): Secondary | ICD-10-CM | POA: Diagnosis not present

## 2022-11-07 DIAGNOSIS — Z9181 History of falling: Secondary | ICD-10-CM | POA: Diagnosis not present

## 2022-11-07 DIAGNOSIS — R262 Difficulty in walking, not elsewhere classified: Secondary | ICD-10-CM | POA: Diagnosis not present

## 2022-11-07 DIAGNOSIS — I120 Hypertensive chronic kidney disease with stage 5 chronic kidney disease or end stage renal disease: Secondary | ICD-10-CM | POA: Diagnosis not present

## 2022-11-08 DIAGNOSIS — Z992 Dependence on renal dialysis: Secondary | ICD-10-CM | POA: Diagnosis not present

## 2022-11-08 DIAGNOSIS — D631 Anemia in chronic kidney disease: Secondary | ICD-10-CM | POA: Diagnosis not present

## 2022-11-08 DIAGNOSIS — N186 End stage renal disease: Secondary | ICD-10-CM | POA: Diagnosis not present

## 2022-11-08 DIAGNOSIS — N2581 Secondary hyperparathyroidism of renal origin: Secondary | ICD-10-CM | POA: Diagnosis not present

## 2022-11-08 DIAGNOSIS — N250 Renal osteodystrophy: Secondary | ICD-10-CM | POA: Diagnosis not present

## 2022-11-08 DIAGNOSIS — D509 Iron deficiency anemia, unspecified: Secondary | ICD-10-CM | POA: Diagnosis not present

## 2022-11-10 ENCOUNTER — Telehealth (HOSPITAL_COMMUNITY): Payer: Self-pay | Admitting: *Deleted

## 2022-11-10 ENCOUNTER — Other Ambulatory Visit: Payer: Self-pay

## 2022-11-10 DIAGNOSIS — R262 Difficulty in walking, not elsewhere classified: Secondary | ICD-10-CM | POA: Diagnosis not present

## 2022-11-10 DIAGNOSIS — I120 Hypertensive chronic kidney disease with stage 5 chronic kidney disease or end stage renal disease: Secondary | ICD-10-CM | POA: Diagnosis not present

## 2022-11-10 DIAGNOSIS — Z9181 History of falling: Secondary | ICD-10-CM | POA: Diagnosis not present

## 2022-11-10 DIAGNOSIS — M6281 Muscle weakness (generalized): Secondary | ICD-10-CM | POA: Diagnosis not present

## 2022-11-10 NOTE — Telephone Encounter (Signed)
Spoke with Psychologist, occupational at [employer]. Scheduled patient for [facility] removal with Dr. Donnetta Hutching at [facility] on 11/18/22. Instructions provided. She verbalized understanding and will notify [facility].

## 2022-11-10 NOTE — Telephone Encounter (Signed)
Received fax from [facility] dialysis requesting CVC removal by Dr Donnetta Hutching. Will give to [facility] to schedule.

## 2022-11-11 DIAGNOSIS — D631 Anemia in chronic kidney disease: Secondary | ICD-10-CM | POA: Diagnosis not present

## 2022-11-11 DIAGNOSIS — D509 Iron deficiency anemia, unspecified: Secondary | ICD-10-CM | POA: Diagnosis not present

## 2022-11-11 DIAGNOSIS — N186 End stage renal disease: Secondary | ICD-10-CM | POA: Diagnosis not present

## 2022-11-11 DIAGNOSIS — Z992 Dependence on renal dialysis: Secondary | ICD-10-CM | POA: Diagnosis not present

## 2022-11-11 DIAGNOSIS — N2581 Secondary hyperparathyroidism of renal origin: Secondary | ICD-10-CM | POA: Diagnosis not present

## 2022-11-11 DIAGNOSIS — N250 Renal osteodystrophy: Secondary | ICD-10-CM | POA: Diagnosis not present

## 2022-11-12 DIAGNOSIS — I120 Hypertensive chronic kidney disease with stage 5 chronic kidney disease or end stage renal disease: Secondary | ICD-10-CM | POA: Diagnosis not present

## 2022-11-12 DIAGNOSIS — Z9181 History of falling: Secondary | ICD-10-CM | POA: Diagnosis not present

## 2022-11-12 DIAGNOSIS — R262 Difficulty in walking, not elsewhere classified: Secondary | ICD-10-CM | POA: Diagnosis not present

## 2022-11-12 DIAGNOSIS — M6281 Muscle weakness (generalized): Secondary | ICD-10-CM | POA: Diagnosis not present

## 2022-11-13 DIAGNOSIS — Z992 Dependence on renal dialysis: Secondary | ICD-10-CM | POA: Diagnosis not present

## 2022-11-13 DIAGNOSIS — N2581 Secondary hyperparathyroidism of renal origin: Secondary | ICD-10-CM | POA: Diagnosis not present

## 2022-11-13 DIAGNOSIS — N250 Renal osteodystrophy: Secondary | ICD-10-CM | POA: Diagnosis not present

## 2022-11-13 DIAGNOSIS — D509 Iron deficiency anemia, unspecified: Secondary | ICD-10-CM | POA: Diagnosis not present

## 2022-11-13 DIAGNOSIS — N186 End stage renal disease: Secondary | ICD-10-CM | POA: Diagnosis not present

## 2022-11-13 DIAGNOSIS — D631 Anemia in chronic kidney disease: Secondary | ICD-10-CM | POA: Diagnosis not present

## 2022-11-14 ENCOUNTER — Telehealth: Payer: Self-pay

## 2022-11-14 DIAGNOSIS — M6281 Muscle weakness (generalized): Secondary | ICD-10-CM | POA: Diagnosis not present

## 2022-11-14 DIAGNOSIS — I120 Hypertensive chronic kidney disease with stage 5 chronic kidney disease or end stage renal disease: Secondary | ICD-10-CM | POA: Diagnosis not present

## 2022-11-14 DIAGNOSIS — R262 Difficulty in walking, not elsewhere classified: Secondary | ICD-10-CM | POA: Diagnosis not present

## 2022-11-14 DIAGNOSIS — Z9181 History of falling: Secondary | ICD-10-CM | POA: Diagnosis not present

## 2022-11-14 NOTE — Telephone Encounter (Signed)
Spoke to [place] at [facility] to give her time of arrival of 0815 at [facility] on 11/18/22.

## 2022-11-15 DIAGNOSIS — Z992 Dependence on renal dialysis: Secondary | ICD-10-CM | POA: Diagnosis not present

## 2022-11-15 DIAGNOSIS — D509 Iron deficiency anemia, unspecified: Secondary | ICD-10-CM | POA: Diagnosis not present

## 2022-11-15 DIAGNOSIS — N250 Renal osteodystrophy: Secondary | ICD-10-CM | POA: Diagnosis not present

## 2022-11-15 DIAGNOSIS — N2581 Secondary hyperparathyroidism of renal origin: Secondary | ICD-10-CM | POA: Diagnosis not present

## 2022-11-15 DIAGNOSIS — N186 End stage renal disease: Secondary | ICD-10-CM | POA: Diagnosis not present

## 2022-11-15 DIAGNOSIS — D631 Anemia in chronic kidney disease: Secondary | ICD-10-CM | POA: Diagnosis not present

## 2022-11-18 ENCOUNTER — Ambulatory Visit (HOSPITAL_COMMUNITY)
Admission: RE | Admit: 2022-11-18 | Discharge: 2022-11-18 | Disposition: A | Discharge status: Home / Self Care | Payer: Medicare Other | Source: Ambulatory Visit | Attending: Vascular Surgery | Admitting: Vascular Surgery

## 2022-11-18 ENCOUNTER — Encounter (HOSPITAL_COMMUNITY): Payer: Self-pay | Admitting: Vascular Surgery

## 2022-11-18 ENCOUNTER — Encounter (HOSPITAL_COMMUNITY)
Admission: RE | Disposition: A | Discharge status: Home / Self Care | Payer: Self-pay | Source: Ambulatory Visit | Attending: Vascular Surgery

## 2022-11-18 DIAGNOSIS — N186 End stage renal disease: Secondary | ICD-10-CM | POA: Diagnosis not present

## 2022-11-18 DIAGNOSIS — Z9181 History of falling: Secondary | ICD-10-CM | POA: Diagnosis not present

## 2022-11-18 DIAGNOSIS — R262 Difficulty in walking, not elsewhere classified: Secondary | ICD-10-CM | POA: Diagnosis not present

## 2022-11-18 DIAGNOSIS — M6281 Muscle weakness (generalized): Secondary | ICD-10-CM | POA: Diagnosis not present

## 2022-11-18 DIAGNOSIS — E1122 Type 2 diabetes mellitus with diabetic chronic kidney disease: Secondary | ICD-10-CM

## 2022-11-18 DIAGNOSIS — I120 Hypertensive chronic kidney disease with stage 5 chronic kidney disease or end stage renal disease: Secondary | ICD-10-CM | POA: Diagnosis not present

## 2022-11-18 DIAGNOSIS — N185 Chronic kidney disease, stage 5: Secondary | ICD-10-CM | POA: Diagnosis not present

## 2022-11-18 HISTORY — PX: REMOVAL OF A DIALYSIS CATHETER: SHX6053

## 2022-11-18 LAB — GLUCOSE, CAPILLARY
Glucose-Capillary: 71 mg/dL (ref 70–99)
Glucose-Capillary: 90 mg/dL (ref 70–99)

## 2022-11-18 SURGERY — REMOVAL, DIALYSIS CATHETER
Anesthesia: LOCAL

## 2022-11-18 MED ORDER — LIDOCAINE HCL (PF) 1 % IJ SOLN
INTRAMUSCULAR | Status: AC
  Filled 2022-11-18: qty 30

## 2022-11-18 SURGICAL SUPPLY — 20 items
ADH SKN CLS APL DERMABOND .7 (GAUZE/BANDAGES/DRESSINGS) ×1
APL PRP STRL LF ISPRP CHG 10.5 (MISCELLANEOUS) ×1
APPLICATOR CHLORAPREP 10.5 ORG (MISCELLANEOUS) ×1 IMPLANT
CLOTH BEACON ORANGE TIMEOUT ST (SAFETY) ×1 IMPLANT
DECANTER SPIKE VIAL GLASS SM (MISCELLANEOUS) ×1 IMPLANT
DERMABOND ADVANCED .7 DNX12 (GAUZE/BANDAGES/DRESSINGS) ×1 IMPLANT
DRAPE HALF SHEET 40X57 (DRAPES) IMPLANT
ELECT REM PT RETURN 9FT ADLT (ELECTROSURGICAL) ×1
ELECTRODE REM PT RTRN 9FT ADLT (ELECTROSURGICAL) ×1 IMPLANT
GLOVE BIOGEL PI IND STRL 7.0 (GLOVE) ×2 IMPLANT
GOWN STRL REUS W/TWL LRG LVL3 (GOWN DISPOSABLE) IMPLANT
NDL HYPO 25X1 1.5 SAFETY (NEEDLE) ×1 IMPLANT
NEEDLE HYPO 25X1 1.5 SAFETY (NEEDLE) ×1 IMPLANT
PENCIL SMOKE EVACUATOR COATED (MISCELLANEOUS) IMPLANT
SPONGE GAUZE 2X2 8PLY STRL LF (GAUZE/BANDAGES/DRESSINGS) ×1 IMPLANT
SUT MNCRL AB 4-0 PS2 18 (SUTURE) ×1 IMPLANT
SUT VIC AB 3-0 SH 27 (SUTURE) ×1
SUT VIC AB 3-0 SH 27X BRD (SUTURE) ×1 IMPLANT
SYR CONTROL 10ML LL (SYRINGE) ×1 IMPLANT
TOWEL OR 17X26 4PK STRL BLUE (TOWEL DISPOSABLE) ×1 IMPLANT

## 2022-11-18 NOTE — H&P (Signed)
  Here today for removal of tunneled hemodialysis catheter.  Has had good use of his AV fistula since my office visit with him

## 2022-11-18 NOTE — Op Note (Signed)
    OPERATIVE REPORT  DATE OF SURGERY: 11/18/2022  PATIENT: Zachary [name], 77 y.o. male MRN: [id number]  DOB: May 17, 1946  PRE-OPERATIVE DIAGNOSIS: End-stage renal disease  POST-OPERATIVE DIAGNOSIS:  Same  PROCEDURE: Removal of right tunneled IJ catheter  SURGEON:  Curt Jews, M.D.  PHYSICIAN ASSISTANT: Nurse  The assistant was needed for exposure and to expedite the case  ANESTHESIA: 1% lidocaine local  EBL: per anesthesia record  No intake/output data recorded.  BLOOD ADMINISTERED: none  DRAINS: none  SPECIMEN: none  COUNTS CORRECT:  YES  PATIENT DISPOSITION:  PACU - hemodynamically stable  PROCEDURE DETAILS: Patient was placed on the procedure room.  The area of the catheter and right neck and chest were prepped and draped in usual sterile fashion.  Using local anesthesia, incision was made over the Dacron cuff on the catheter and this was mobilized circumferentially after making a small incision over the cuff.  The catheter was removed in its entirety.  Pressure was held for hemostasis.  The incision was closed with a subcuticular 4-0 Monocryl suture.  Dermabond was applied and the patient was discharged   Rosetta Posner, M.D., [facility] 11/18/2022 9:02 AM  Note: Portions of this report may have been transcribed using voice recognition software.  Every effort has been made to ensure accuracy; however, inadvertent computerized transcription errors may still be present.

## 2022-11-18 NOTE — Progress Notes (Addendum)
Report given to [place], at [facility] . PT noted to have scant blood on gauze at discharge.

## 2022-11-19 ENCOUNTER — Encounter (HOSPITAL_COMMUNITY): Payer: Self-pay | Admitting: Vascular Surgery

## 2022-11-19 DIAGNOSIS — N186 End stage renal disease: Secondary | ICD-10-CM | POA: Diagnosis not present

## 2022-11-19 DIAGNOSIS — D631 Anemia in chronic kidney disease: Secondary | ICD-10-CM | POA: Diagnosis not present

## 2022-11-19 DIAGNOSIS — N250 Renal osteodystrophy: Secondary | ICD-10-CM | POA: Diagnosis not present

## 2022-11-19 DIAGNOSIS — N2581 Secondary hyperparathyroidism of renal origin: Secondary | ICD-10-CM | POA: Diagnosis not present

## 2022-11-19 DIAGNOSIS — Z992 Dependence on renal dialysis: Secondary | ICD-10-CM | POA: Diagnosis not present

## 2022-11-19 DIAGNOSIS — D509 Iron deficiency anemia, unspecified: Secondary | ICD-10-CM | POA: Diagnosis not present

## 2022-11-20 DIAGNOSIS — D631 Anemia in chronic kidney disease: Secondary | ICD-10-CM | POA: Diagnosis not present

## 2022-11-20 DIAGNOSIS — D509 Iron deficiency anemia, unspecified: Secondary | ICD-10-CM | POA: Diagnosis not present

## 2022-11-20 DIAGNOSIS — N186 End stage renal disease: Secondary | ICD-10-CM | POA: Diagnosis not present

## 2022-11-20 DIAGNOSIS — Z9181 History of falling: Secondary | ICD-10-CM | POA: Diagnosis not present

## 2022-11-20 DIAGNOSIS — R262 Difficulty in walking, not elsewhere classified: Secondary | ICD-10-CM | POA: Diagnosis not present

## 2022-11-20 DIAGNOSIS — I120 Hypertensive chronic kidney disease with stage 5 chronic kidney disease or end stage renal disease: Secondary | ICD-10-CM | POA: Diagnosis not present

## 2022-11-20 DIAGNOSIS — N250 Renal osteodystrophy: Secondary | ICD-10-CM | POA: Diagnosis not present

## 2022-11-20 DIAGNOSIS — N2581 Secondary hyperparathyroidism of renal origin: Secondary | ICD-10-CM | POA: Diagnosis not present

## 2022-11-20 DIAGNOSIS — M6281 Muscle weakness (generalized): Secondary | ICD-10-CM | POA: Diagnosis not present

## 2022-11-20 DIAGNOSIS — Z992 Dependence on renal dialysis: Secondary | ICD-10-CM | POA: Diagnosis not present

## 2022-11-21 DIAGNOSIS — R262 Difficulty in walking, not elsewhere classified: Secondary | ICD-10-CM | POA: Diagnosis not present

## 2022-11-21 DIAGNOSIS — M6281 Muscle weakness (generalized): Secondary | ICD-10-CM | POA: Diagnosis not present

## 2022-11-21 DIAGNOSIS — Z9181 History of falling: Secondary | ICD-10-CM | POA: Diagnosis not present

## 2022-11-21 DIAGNOSIS — I120 Hypertensive chronic kidney disease with stage 5 chronic kidney disease or end stage renal disease: Secondary | ICD-10-CM | POA: Diagnosis not present

## 2022-11-22 DIAGNOSIS — N186 End stage renal disease: Secondary | ICD-10-CM | POA: Diagnosis not present

## 2022-11-22 DIAGNOSIS — D509 Iron deficiency anemia, unspecified: Secondary | ICD-10-CM | POA: Diagnosis not present

## 2022-11-22 DIAGNOSIS — Z992 Dependence on renal dialysis: Secondary | ICD-10-CM | POA: Diagnosis not present

## 2022-11-22 DIAGNOSIS — D631 Anemia in chronic kidney disease: Secondary | ICD-10-CM | POA: Diagnosis not present

## 2022-11-22 DIAGNOSIS — N250 Renal osteodystrophy: Secondary | ICD-10-CM | POA: Diagnosis not present

## 2022-11-22 DIAGNOSIS — N2581 Secondary hyperparathyroidism of renal origin: Secondary | ICD-10-CM | POA: Diagnosis not present

## 2022-11-24 DIAGNOSIS — Z9181 History of falling: Secondary | ICD-10-CM | POA: Diagnosis not present

## 2022-11-24 DIAGNOSIS — I120 Hypertensive chronic kidney disease with stage 5 chronic kidney disease or end stage renal disease: Secondary | ICD-10-CM | POA: Diagnosis not present

## 2022-11-24 DIAGNOSIS — R262 Difficulty in walking, not elsewhere classified: Secondary | ICD-10-CM | POA: Diagnosis not present

## 2022-11-24 DIAGNOSIS — M6281 Muscle weakness (generalized): Secondary | ICD-10-CM | POA: Diagnosis not present

## 2022-11-25 DIAGNOSIS — D631 Anemia in chronic kidney disease: Secondary | ICD-10-CM | POA: Diagnosis not present

## 2022-11-25 DIAGNOSIS — Z992 Dependence on renal dialysis: Secondary | ICD-10-CM | POA: Diagnosis not present

## 2022-11-25 DIAGNOSIS — N250 Renal osteodystrophy: Secondary | ICD-10-CM | POA: Diagnosis not present

## 2022-11-25 DIAGNOSIS — D509 Iron deficiency anemia, unspecified: Secondary | ICD-10-CM | POA: Diagnosis not present

## 2022-11-25 DIAGNOSIS — N2581 Secondary hyperparathyroidism of renal origin: Secondary | ICD-10-CM | POA: Diagnosis not present

## 2022-11-25 DIAGNOSIS — N186 End stage renal disease: Secondary | ICD-10-CM | POA: Diagnosis not present

## 2022-11-26 DIAGNOSIS — I120 Hypertensive chronic kidney disease with stage 5 chronic kidney disease or end stage renal disease: Secondary | ICD-10-CM | POA: Diagnosis not present

## 2022-11-26 DIAGNOSIS — Z992 Dependence on renal dialysis: Secondary | ICD-10-CM | POA: Diagnosis not present

## 2022-11-26 DIAGNOSIS — R262 Difficulty in walking, not elsewhere classified: Secondary | ICD-10-CM | POA: Diagnosis not present

## 2022-11-26 DIAGNOSIS — Z9181 History of falling: Secondary | ICD-10-CM | POA: Diagnosis not present

## 2022-11-26 DIAGNOSIS — M6281 Muscle weakness (generalized): Secondary | ICD-10-CM | POA: Diagnosis not present

## 2022-11-26 DIAGNOSIS — N186 End stage renal disease: Secondary | ICD-10-CM | POA: Diagnosis not present

## 2022-11-27 DIAGNOSIS — D631 Anemia in chronic kidney disease: Secondary | ICD-10-CM | POA: Diagnosis not present

## 2022-11-27 DIAGNOSIS — N2581 Secondary hyperparathyroidism of renal origin: Secondary | ICD-10-CM | POA: Diagnosis not present

## 2022-11-27 DIAGNOSIS — N186 End stage renal disease: Secondary | ICD-10-CM | POA: Diagnosis not present

## 2022-11-27 DIAGNOSIS — Z992 Dependence on renal dialysis: Secondary | ICD-10-CM | POA: Diagnosis not present

## 2022-11-27 DIAGNOSIS — N250 Renal osteodystrophy: Secondary | ICD-10-CM | POA: Diagnosis not present

## 2022-11-27 DIAGNOSIS — D509 Iron deficiency anemia, unspecified: Secondary | ICD-10-CM | POA: Diagnosis not present

## 2022-11-27 DIAGNOSIS — R262 Difficulty in walking, not elsewhere classified: Secondary | ICD-10-CM | POA: Diagnosis not present

## 2022-11-27 DIAGNOSIS — Z9181 History of falling: Secondary | ICD-10-CM | POA: Diagnosis not present

## 2022-11-27 DIAGNOSIS — M6281 Muscle weakness (generalized): Secondary | ICD-10-CM | POA: Diagnosis not present

## 2022-11-27 DIAGNOSIS — I120 Hypertensive chronic kidney disease with stage 5 chronic kidney disease or end stage renal disease: Secondary | ICD-10-CM | POA: Diagnosis not present

## 2022-11-28 DIAGNOSIS — Z9181 History of falling: Secondary | ICD-10-CM | POA: Diagnosis not present

## 2022-11-28 DIAGNOSIS — R262 Difficulty in walking, not elsewhere classified: Secondary | ICD-10-CM | POA: Diagnosis not present

## 2022-11-28 DIAGNOSIS — I120 Hypertensive chronic kidney disease with stage 5 chronic kidney disease or end stage renal disease: Secondary | ICD-10-CM | POA: Diagnosis not present

## 2022-11-28 DIAGNOSIS — M6281 Muscle weakness (generalized): Secondary | ICD-10-CM | POA: Diagnosis not present

## 2022-11-29 DIAGNOSIS — D509 Iron deficiency anemia, unspecified: Secondary | ICD-10-CM | POA: Diagnosis not present

## 2022-11-29 DIAGNOSIS — N250 Renal osteodystrophy: Secondary | ICD-10-CM | POA: Diagnosis not present

## 2022-11-29 DIAGNOSIS — Z992 Dependence on renal dialysis: Secondary | ICD-10-CM | POA: Diagnosis not present

## 2022-11-29 DIAGNOSIS — N2581 Secondary hyperparathyroidism of renal origin: Secondary | ICD-10-CM | POA: Diagnosis not present

## 2022-11-29 DIAGNOSIS — D631 Anemia in chronic kidney disease: Secondary | ICD-10-CM | POA: Diagnosis not present

## 2022-11-29 DIAGNOSIS — N186 End stage renal disease: Secondary | ICD-10-CM | POA: Diagnosis not present

## 2022-12-01 ENCOUNTER — Non-Acute Institutional Stay (SKILLED_NURSING_FACILITY): Payer: Medicare Other | Admitting: Adult Health

## 2022-12-01 ENCOUNTER — Encounter: Payer: Self-pay | Admitting: Adult Health

## 2022-12-01 DIAGNOSIS — F321 Major depressive disorder, single episode, moderate: Secondary | ICD-10-CM | POA: Diagnosis not present

## 2022-12-01 DIAGNOSIS — Z9181 History of falling: Secondary | ICD-10-CM | POA: Diagnosis not present

## 2022-12-01 DIAGNOSIS — N186 End stage renal disease: Secondary | ICD-10-CM | POA: Diagnosis not present

## 2022-12-01 DIAGNOSIS — D631 Anemia in chronic kidney disease: Secondary | ICD-10-CM

## 2022-12-01 DIAGNOSIS — M6281 Muscle weakness (generalized): Secondary | ICD-10-CM | POA: Diagnosis not present

## 2022-12-01 DIAGNOSIS — E034 Atrophy of thyroid (acquired): Secondary | ICD-10-CM | POA: Diagnosis not present

## 2022-12-01 DIAGNOSIS — I120 Hypertensive chronic kidney disease with stage 5 chronic kidney disease or end stage renal disease: Secondary | ICD-10-CM | POA: Diagnosis not present

## 2022-12-01 DIAGNOSIS — Z992 Dependence on renal dialysis: Secondary | ICD-10-CM | POA: Diagnosis not present

## 2022-12-01 DIAGNOSIS — R262 Difficulty in walking, not elsewhere classified: Secondary | ICD-10-CM | POA: Diagnosis not present

## 2022-12-01 DIAGNOSIS — Z794 Long term (current) use of insulin: Secondary | ICD-10-CM

## 2022-12-01 NOTE — Progress Notes (Unsigned)
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 There are no reports of uncontrolled pain. He continues to get out of bed daily; continues on hemodialysis   Past Medical History:  Diagnosis Date   Abnormal CT scan, kidney 10/06/2011   Acute pyelonephritis 10/07/2011   Anemia    normocytic   Anxiety    mental retardation   Bladder wall thickening 10/06/2011   BPH (benign prostatic hypertrophy)    Diabetes mellitus    Dialysis patient ([place])    Tuesday, Thursday and Saturday,    DVT of leg (deep venous thrombosis) ([place]) 12/25/2016   Edema     history of lower extremity edema   GERD (gastroesophageal reflux disease)    Heme positive stool    Hydronephrosis    Hyperkalemia    Hyperlipidemia    Hypernatremia    Hypertension    Hypothyroidism    Impaired speech    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[place])    MR (mental retardation)    Muscle weakness    Obstructive uropathy    Perinephric abscess 10/07/2011   Poor historian poor historian   Primary colorectal adenocarcinoma ([place]) 04/2017   S/p total colectomy   Protein calorie malnutrition ([place])    Pyelonephritis    Renal failure (ARF), acute on chronic (HCC)    Renal insufficiency    chronic history   Sepsis ([place])    Smoking    Uremia    Urinary retention    UTI (lower urinary tract infection) 10/06/2011    Past Surgical History:   Procedure Laterality Date   A/V FISTULAGRAM N/A 08/13/2018   Procedure: A/V FISTULAGRAM - Right Upper;  Surgeon: Elam Dutch, MD;  Location: [facility];  Service: Cardiovascular;  Laterality: N/A;   A/V FISTULAGRAM N/A 11/22/2018   Procedure: A/V FISTULAGRAM - Right Upper;  Surgeon: Waynetta Sandy, MD;  Location: [facility];  Service: Cardiovascular;  Laterality: N/A;   A/V FISTULAGRAM Left 10/08/2022   Procedure: A/V Fistulagram;  Surgeon: Broadus John, MD;  Location: [facility];  Service: Cardiovascular;  Laterality: Left;   AV FISTULA PLACEMENT Left 07/06/2015   Procedure:  INSERTION LEFT ARM ARTERIOVENOUS GORTEX GRAFT;  Surgeon: Angelia Mould, MD;  Location: [facility];  Service: Vascular;  Laterality: Left;   AV FISTULA PLACEMENT Right 02/26/2016   Procedure: ARTERIOVENOUS (AV) FISTULA CREATION ;  Surgeon: Angelia Mould, MD;  Location: [facility];  Service: Vascular;  Laterality: Right;   AV FISTULA PLACEMENT Right 11/25/2018   Procedure: INSERTION OF ARTERIOVENOUS (AV) ARTEGRAFT RIGHT UPPER ARM;  Surgeon: Waynetta Sandy, MD;  Location: [facility];  Service: Vascular;  Laterality: Right;   AV FISTULA PLACEMENT Left 05/28/2021   Procedure: LEFT ARM ARTERIOVENOUS (AV) FISTULA;  Surgeon: Rosetta Posner, MD;  Location: [facility];  Service: Vascular;  Laterality: Left;   [place] Left 10/09/2015   Procedure: REMOVAL OF ARTERIOVENOUS GORETEX GRAFT ([place]) Evacuation of Lymphocele, Vein Patch angioplasty of brachial artery.;  Surgeon: Angelia Mould, MD;  Location: [facility];  Service: Vascular;  Laterality: Left;   [place] Right 02/26/2016   Procedure: Right [place];  Surgeon: Angelia Mould, MD;  Location: [facility];  Service: Vascular;  Laterality: Right;   [place] Left 07/30/2021   Procedure: LEFT ARM SECOND STAGE BASILIC VEIN TRANSPOSITION;  Surgeon: Rosetta Posner, MD;   Location[facility];  Service: Vascular;  Laterality: Left;   CIRCUMCISION N/A 01/04/2014   Procedure: CIRCUMCISION ADULT (procedure #1);  Surgeon: Marissa Nestle, MD;  [facility];  Service: Urology;  Laterality: N/A;   COLECTOMY N/A 05/04/2017   Procedure: TOTAL COLECTOMY;  Surgeon: Aviva Signs, MD;  Location: [facility];  Service: General;  Laterality: N/A;   COLONOSCOPY N/A 04/27/2017   Surgeon: Daneil Dolin, MD; annular mass in the ascending colon likely representing cancer biopsied, multiple 6-22 mm polyps removed, clean rectum.  Pathology with multiple tubular adenomas, high-grade dysplasia noted in ascending colon and splenic flexure.   CYSTOSCOPY W/ RETROGRADES Bilateral 06/29/2015   Procedure: CYSTOSCOPY, DILATION OF URETHRAL STRICTURE WITH BILATERAL RETROGRADE PYELOGRAM,SUPRAPUBIC TUBE CHANGE;  Surgeon: Festus Aloe, MD;  Location: [facility];  Service: Urology;  Laterality: Bilateral;   CYSTOSCOPY WITH URETHRAL DILATATION N/A 12/29/2013   Procedure: CYSTOSCOPY WITH URETHRAL DILATATION;  Surgeon: Marissa Nestle, MD;  [facility];  Service: Urology;  Laterality: N/A;   ESOPHAGOGASTRODUODENOSCOPY N/A 04/27/2017   Procedure: ESOPHAGOGASTRODUODENOSCOPY (EGD);  Surgeon: Daneil Dolin, MD;  [facility];  Service: Endoscopy;  Laterality: N/A;   FLEXIBLE SIGMOIDOSCOPY N/A 12/02/2021   Procedure: FLEXIBLE SIGMOIDOSCOPY;  Surgeon: Eloise Harman, DO;  [facility];  Service: Endoscopy;  Laterality: N/A;  10:30am, dialysis patient   INSERTION OF DIALYSIS CATHETER Right 11/25/2018   Procedure: INSERTION OF DIALYSIS CATHETER RIGHT INTERNAL JUGULAR;  Surgeon: Waynetta Sandy, MD;  Location: [facility];  Service: Vascular;  Laterality: Right;   IR AV DIALY SHUNT INTRO NEEDLE/INTRACATH INITIAL W/PTA/IMG RIGHT Right 09/07/2018   IR FLUORO GUIDE CV LINE RIGHT  10/16/2020   IR FLUORO GUIDE CV LINE RIGHT  10/03/2022   IR REMOVAL TUN CV CATH W/O FL  01/12/2019    IR THROMBECTOMY AV FISTULA W/THROMBOLYSIS/PTA INC/SHUNT/IMG RIGHT Right 04/26/2018   IR US GUIDE VASC ACCESS RIGHT  04/26/2018   IR US GUIDE VASC ACCESS RIGHT  09/07/2018   IR US GUIDE VASC ACCESS RIGHT  10/16/2020   IR US GUIDE VASC ACCESS RIGHT  10/16/2020   IR US GUIDE VASC ACCESS RIGHT  10/03/2022   ORIF FEMUR FRACTURE Right 11/22/2016   Procedure: OPEN REDUCTION INTERNAL FIXATION (ORIF) DISTAL FEMUR FRACTURE;  Surgeon: Rod Can, MD;  Location: [facility];  Service: Orthopedics;  Laterality: Right;   PATCH ANGIOPLASTY Right 12/10/2017   Procedure: PATCH ANGIOPLASTY;  Surgeon: Angelia Mould, MD;  Location: [facility];  Service: Vascular;  Laterality: Right;   PERIPHERAL VASCULAR BALLOON ANGIOPLASTY  08/13/2018   Procedure: PERIPHERAL VASCULAR BALLOON ANGIOPLASTY;  Surgeon: Elam Dutch, MD;  Location: [facility];  Service: Cardiovascular;;  right AV fistula    PERIPHERAL VASCULAR BALLOON ANGIOPLASTY  11/22/2018   Procedure: PERIPHERAL VASCULAR BALLOON ANGIOPLASTY;  Surgeon: Waynetta Sandy, MD;  Location: [facility];  Service: Cardiovascular;;  rt AV  fistula   PERIPHERAL VASCULAR BALLOON ANGIOPLASTY  10/08/2022   Procedure: PERIPHERAL VASCULAR BALLOON ANGIOPLASTY;  Surgeon: Broadus John, MD;  Location: [facility];  Service: Cardiovascular;;  arterial anastamosis   PERIPHERAL VASCULAR CATHETERIZATION N/A 10/08/2015   Procedure: A/V Shuntogram;  Surgeon: Angelia Mould, MD;  Location: [facility];  Service: Cardiovascular;  Laterality: N/A;   PERIPHERAL VASCULAR INTERVENTION Left 10/08/2022   Procedure: PERIPHERAL VASCULAR INTERVENTION;  Surgeon: Broadus John, MD;  Location: [facility];  Service: Cardiovascular;  Laterality: Left;  left AVF   REMOVAL OF A DIALYSIS CATHETER N/A 12/03/2021   Procedure: MINOR REMOVAL OF A TUNNELED DIALYSIS CATHETER;  Surgeon: Rosetta Posner, MD;  [facility];  Service: Vascular;   Laterality: N/A;   REMOVAL OF A DIALYSIS CATHETER N/A 11/18/2022   Procedure: MINOR REMOVAL OF A TUNNELED DIALYSIS CATHETER;  Surgeon: Rosetta Posner, MD;  [facility];  Service: Vascular;  Laterality: N/A;   THROMBECTOMY W/ EMBOLECTOMY Right 12/10/2017   Procedure: THROMBECTOMY REVISION RIGHT ARM  ARTERIOVENOUS FISTULA;  Surgeon: Angelia Mould, MD;  Location[facility];  Service: Vascular;  Laterality: Right;   TRANSURETHRAL RESECTION OF PROSTATE N/A 01/04/2014   Procedure: TRANSURETHRAL RESECTION OF THE PROSTATE (TURP) (procedure #2);  Surgeon: Marissa Nestle, MD;  [facility];  Service: Urology;  Laterality: N/A;    Social History   Socioeconomic History   Marital status: Single    Spouse name: Not on file   Number of children: Not on file   Years of education: Not on file   Highest education level: Not on file  Occupational History   Occupation: retired   Tobacco Use   Smoking status: Never   Smokeless tobacco: Never  Vaping Use   Vaping Use: Never used  Substance and Sexual Activity   Alcohol use: No   Drug use: No   Sexual activity: Not Currently  Other Topics Concern   Not on file  Social History Narrative   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   [facility] resident [facility]    Social Determinants of Health   Financial Resource Strain: Low Risk  (02/18/2018)   Overall Financial Resource Strain (CARDIA)    Difficulty of Paying Living Expenses: Not hard at all  Food Insecurity: No Food Insecurity (02/18/2018)   Hunger Vital Sign    Worried About Running Out of Food in the Last Year: Never true    Ran Out of Food in the Last Year: Never true  Transportation Needs: No Transportation Needs (02/18/2018)   PRAPARE - Hydrologist (Medical): No    Lack of Transportation (Non-Medical): No  Physical Activity: Unknown (09/15/2019)   Exercise Vital Sign    Days of Exercise per Week: Not on file     Minutes of Exercise per Session: 0 min  Stress: No Stress Concern Present (02/18/2018)   [place]    Feeling of Stress : Not at all  Social Connections: Unknown (09/15/2019)   Social Connection and Isolation Panel [NHANES]    Frequency of Communication with Friends and Family: Not on file    Frequency of Social Gatherings with Friends and Family: Never    Attends Religious Services: Not on file    Active Member of Clubs or Organizations: Not on file    Attends Archivist Meetings: Not on file  Marital Status: Not on file  Intimate Partner Violence: Not At Risk (02/18/2018)   Humiliation, Afraid, Rape, and Kick questionnaire    Fear of Current or Ex-Partner: No    Emotionally Abused: No    Physically Abused: No    Sexually Abused: No   Family History  Problem Relation Age of Onset   Cancer Mother    Colon cancer Neg Hx       VITAL SIGNS BP 118/64   Pulse 82   Temp 97.7 F (36.5 C)   Resp 20   Ht '5\' 8"'$  (1.727 m)   Wt 205 lb 12.8 oz (93.4 kg)   SpO2 96%   BMI 31.29 kg/m   Outpatient Encounter Medications as of 12/01/2022  Medication Sig   aspirin EC 81 MG tablet Take 81 mg by mouth daily. 0900   atorvastatin (LIPITOR) 20 MG tablet Take 20 mg by mouth at bedtime. 2000   Insulin Glargine (BASAGLAR KWIKPEN) 100 UNIT/ML Inject 8 Units into the skin at bedtime. 2100 DO NOT HOLD this insulin without provider notification   Insulin Pen Needle (BD AUTOSHIELD DUO) 30G X 5 MM MISC by Does not apply route. 3/16"   levothyroxine (SYNTHROID) 125 MCG tablet Take 125 mcg by mouth at bedtime. 2100   midodrine (PROAMATINE) 10 MG tablet Take 10 mg by mouth See admin instructions. Send med with resident on  Tues/Thurs/Sat to dialysis. do not give [facility], give to resident to take with him. send with '5mg'$  to equal '15mg'$ . 6:00   midodrine (PROAMATINE) 5 MG tablet Take 5 mg by mouth See admin instructions. Send  with resident on dialysis days.  Tues/Thurs/Sat  Take along with 10 mg to equal 15 mg 6:00   NON FORMULARY Diet: NAS, Cons CHO   omega-3 acid ethyl esters (LOVAZA) 1 g capsule Take 2 g by mouth at bedtime. 2100   omeprazole (PRILOSEC) 40 MG capsule Take 40 mg by mouth daily. 0900   polyethylene glycol (MIRALAX / GLYCOLAX) packet Take 17 g by mouth daily as needed for mild constipation.    sevelamer carbonate (RENVELA) 800 MG tablet Take 3 tablets (2,400 mg total) by mouth 3 (three) times daily with meals.   tamsulosin (FLOMAX) 0.4 MG CAPS capsule Take 0.4 mg by mouth daily at 6 PM. Give 30 minutes after a meal. Do not crush or chew   torsemide (DEMADEX) 20 MG tablet Take 20 mg by mouth as directed. Sunday, Monday, Wednesday, and Friday   vitamin B-12 (CYANOCOBALAMIN) 1000 MCG tablet Take 1,000 mcg by mouth daily.   No facility-administered encounter medications on file as of 12/01/2022.     SIGNIFICANT DIAGNOSTIC EXAMS  PREVIOUS   12-30-21: ct of abdomen and pelvis:  1. No significant change in size or appearance of the hypodense lesion in the head of the pancreas, possible cyst or IPMN. Recommend continued follow-up with contrast-enhanced MRI or CT in 2 years. 2. Cholelithiasis. 3. Left renal calculus. Bilateral renal mild atrophy with multiple renal cysts. 4. Other chronic findings as described.  06-11-22: dexa: t score: -2.052  NO NEW EXAMS.    LABS REVIEWED PREVIOUS;     12-02-21: hgb 10.5; hct 31.0; glucose 85; bun 25; creat 9.80; k+ 3.9; na++ 140;   01-28-22; wbc 4.8; hgb 8.7; hct 26.0; mcv 105.3; plt 113; glucose 1; bun 40; creat 11.35; k+ 3.8; na++ 141; ca 8.8; GFR 4 d-dimer 0.43; CRP <0.5   TODAY  06-19-22: hgb a1c 5.8; tsh 2.539 free t4:  0.98 09-29-22: wbc 4.3; hgb 12.9; hct 41.1; mcv 108.4 plt 108; glucose 112; bun 44; creat 16.87; k+ 4.6; na++ 139; ca 9.3 gfr 3; protein 7.0; albumin 3.5; hgb A1c 5.0; tsh 5.861 chol 89; ldl 41; trig 68; hdl 35 10-08-22: glucose 101; bun 22;  creat 12.30; k+ 5.2; na++ 141; ion ca 1.14;   Review of Systems  Constitutional:  Negative for malaise/fatigue.  Respiratory:  Negative for cough and shortness of breath.   Cardiovascular:  Negative for chest pain, palpitations and leg swelling.  Gastrointestinal:  Negative for abdominal pain, constipation and heartburn.  Musculoskeletal:  Negative for back pain, joint pain and myalgias.  Skin: Negative.   Neurological:  Negative for dizziness.  Psychiatric/Behavioral:  The patient is not nervous/anxious.     Physical Exam Constitutional:      General: He is not in acute distress.    Appearance: He is well-developed. He is obese. He is not diaphoretic.  Neck:     Thyroid: No thyromegaly.  Cardiovascular:     Rate and Rhythm: Normal rate and regular rhythm.     Pulses: Normal pulses.     Heart sounds: Normal heart sounds.  Pulmonary:     Effort: Pulmonary effort is normal. No respiratory distress.     Breath sounds: Normal breath sounds.  Abdominal:     General: Bowel sounds are normal. There is no distension.     Palpations: Abdomen is soft.     Tenderness: There is no abdominal tenderness.  Musculoskeletal:        General: Normal range of motion.     Cervical back: Neck supple.     Right lower leg: No edema.     Left lower leg: No edema.  Lymphadenopathy:     Cervical: No cervical adenopathy.  Skin:    General: Skin is warm and dry.     Comments: Left upper arm AV fistula   Neurological:     Mental Status: He is alert. Mental status is at baseline.  Psychiatric:        Mood and Affect: Mood normal.      ASSESSMENT/ PLAN:  TODAY  Other specified hypothyroidism: tsh 5.861 will continue synthroid 125 mcg daily   2. 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 Major depression single episode moderate: is presently off medications.   PREVIOUS   4. Osteopenia; t score -2.052 is on supplements  5. GERD without esophagitis: will  continue prilosec 40 mg daily   6. History of colon cancer: s/p colectomy last seen by GI on 12-19-21; ct scan done 12-30-21.   7. Dyslipidemia associated with type 2 diabetes mellitus: LDL 41 trig 63 will continue lipitor 20 mg daily lovaza 2 gm daily   8. Vitamin B 12 deficiency; is on daily supplement  9. Secondary hyperparathyroidism of renal origin: will monitor  10. Bilateral lower extremity edema: will continue demedex 20 mg 4 times weekly   11. 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 Thrombocytopenia: platelet 108  13. Aortic atherosclerosis (ct 12-30-21) is on asa statin   14. 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: is on hemodialysis three days weekly 1200 cc fluid restriction; does not adhere; midodrine 15 mg with dialysis; renvela 2400 mg three times daily     Neoma Laming  Jameek Bruntz NP [facility]  call [phone]

## 2022-12-02 DIAGNOSIS — N186 End stage renal disease: Secondary | ICD-10-CM | POA: Diagnosis not present

## 2022-12-02 DIAGNOSIS — Z9181 History of falling: Secondary | ICD-10-CM | POA: Diagnosis not present

## 2022-12-02 DIAGNOSIS — I120 Hypertensive chronic kidney disease with stage 5 chronic kidney disease or end stage renal disease: Secondary | ICD-10-CM | POA: Diagnosis not present

## 2022-12-02 DIAGNOSIS — D509 Iron deficiency anemia, unspecified: Secondary | ICD-10-CM | POA: Diagnosis not present

## 2022-12-02 DIAGNOSIS — M6281 Muscle weakness (generalized): Secondary | ICD-10-CM | POA: Diagnosis not present

## 2022-12-02 DIAGNOSIS — N250 Renal osteodystrophy: Secondary | ICD-10-CM | POA: Diagnosis not present

## 2022-12-02 DIAGNOSIS — N2581 Secondary hyperparathyroidism of renal origin: Secondary | ICD-10-CM | POA: Diagnosis not present

## 2022-12-02 DIAGNOSIS — R262 Difficulty in walking, not elsewhere classified: Secondary | ICD-10-CM | POA: Diagnosis not present

## 2022-12-02 DIAGNOSIS — Z992 Dependence on renal dialysis: Secondary | ICD-10-CM | POA: Diagnosis not present

## 2022-12-02 DIAGNOSIS — D631 Anemia in chronic kidney disease: Secondary | ICD-10-CM | POA: Diagnosis not present

## 2022-12-03 DIAGNOSIS — Z9181 History of falling: Secondary | ICD-10-CM | POA: Diagnosis not present

## 2022-12-03 DIAGNOSIS — R262 Difficulty in walking, not elsewhere classified: Secondary | ICD-10-CM | POA: Diagnosis not present

## 2022-12-03 DIAGNOSIS — I120 Hypertensive chronic kidney disease with stage 5 chronic kidney disease or end stage renal disease: Secondary | ICD-10-CM | POA: Diagnosis not present

## 2022-12-03 DIAGNOSIS — M6281 Muscle weakness (generalized): Secondary | ICD-10-CM | POA: Diagnosis not present

## 2022-12-04 DIAGNOSIS — N2581 Secondary hyperparathyroidism of renal origin: Secondary | ICD-10-CM | POA: Diagnosis not present

## 2022-12-04 DIAGNOSIS — Z992 Dependence on renal dialysis: Secondary | ICD-10-CM | POA: Diagnosis not present

## 2022-12-04 DIAGNOSIS — N250 Renal osteodystrophy: Secondary | ICD-10-CM | POA: Diagnosis not present

## 2022-12-04 DIAGNOSIS — N186 End stage renal disease: Secondary | ICD-10-CM | POA: Diagnosis not present

## 2022-12-04 DIAGNOSIS — D631 Anemia in chronic kidney disease: Secondary | ICD-10-CM | POA: Diagnosis not present

## 2022-12-04 DIAGNOSIS — D509 Iron deficiency anemia, unspecified: Secondary | ICD-10-CM | POA: Diagnosis not present

## 2022-12-06 DIAGNOSIS — D631 Anemia in chronic kidney disease: Secondary | ICD-10-CM | POA: Diagnosis not present

## 2022-12-06 DIAGNOSIS — N186 End stage renal disease: Secondary | ICD-10-CM | POA: Diagnosis not present

## 2022-12-06 DIAGNOSIS — N2581 Secondary hyperparathyroidism of renal origin: Secondary | ICD-10-CM | POA: Diagnosis not present

## 2022-12-06 DIAGNOSIS — D509 Iron deficiency anemia, unspecified: Secondary | ICD-10-CM | POA: Diagnosis not present

## 2022-12-06 DIAGNOSIS — Z992 Dependence on renal dialysis: Secondary | ICD-10-CM | POA: Diagnosis not present

## 2022-12-06 DIAGNOSIS — N250 Renal osteodystrophy: Secondary | ICD-10-CM | POA: Diagnosis not present

## 2022-12-09 DIAGNOSIS — D631 Anemia in chronic kidney disease: Secondary | ICD-10-CM | POA: Diagnosis not present

## 2022-12-09 DIAGNOSIS — Z992 Dependence on renal dialysis: Secondary | ICD-10-CM | POA: Diagnosis not present

## 2022-12-09 DIAGNOSIS — N250 Renal osteodystrophy: Secondary | ICD-10-CM | POA: Diagnosis not present

## 2022-12-09 DIAGNOSIS — D509 Iron deficiency anemia, unspecified: Secondary | ICD-10-CM | POA: Diagnosis not present

## 2022-12-09 DIAGNOSIS — N186 End stage renal disease: Secondary | ICD-10-CM | POA: Diagnosis not present

## 2022-12-09 DIAGNOSIS — N2581 Secondary hyperparathyroidism of renal origin: Secondary | ICD-10-CM | POA: Diagnosis not present

## 2022-12-11 DIAGNOSIS — D631 Anemia in chronic kidney disease: Secondary | ICD-10-CM | POA: Diagnosis not present

## 2022-12-11 DIAGNOSIS — N250 Renal osteodystrophy: Secondary | ICD-10-CM | POA: Diagnosis not present

## 2022-12-11 DIAGNOSIS — Z992 Dependence on renal dialysis: Secondary | ICD-10-CM | POA: Diagnosis not present

## 2022-12-11 DIAGNOSIS — D509 Iron deficiency anemia, unspecified: Secondary | ICD-10-CM | POA: Diagnosis not present

## 2022-12-11 DIAGNOSIS — N186 End stage renal disease: Secondary | ICD-10-CM | POA: Diagnosis not present

## 2022-12-11 DIAGNOSIS — N2581 Secondary hyperparathyroidism of renal origin: Secondary | ICD-10-CM | POA: Diagnosis not present

## 2022-12-12 ENCOUNTER — Non-Acute Institutional Stay (SKILLED_NURSING_FACILITY): Payer: Medicare Other | Admitting: Adult Health

## 2022-12-12 ENCOUNTER — Encounter: Payer: Self-pay | Admitting: Adult Health

## 2022-12-12 DIAGNOSIS — I700 Atherosclerosis of aorta: Secondary | ICD-10-CM

## 2022-12-12 DIAGNOSIS — Z992 Dependence on renal dialysis: Secondary | ICD-10-CM | POA: Diagnosis not present

## 2022-12-12 DIAGNOSIS — N2581 Secondary hyperparathyroidism of renal origin: Secondary | ICD-10-CM | POA: Diagnosis not present

## 2022-12-12 NOTE — Progress Notes (Signed)
Location:  [facility] Room Number: NO/112/D Place of Service:  SNF (31)   CODE STATUS: full   No Known Allergies  Chief Complaint  Patient presents with   Acute Visit     care plan meeting    HPI:  We have come together for his care plan meeting. Guardian present. He is nonambulatory per choice with no falls. He is independent with his adl care. He is occasionally incontinent of bladder and bowel. Dietary: weight is 205.8 pounds feeds self regular diet: appetite 50-100% . Therapy: none at this time .  activities: enjoys watching people and eating snacks. He continues to be followed for his chronic illnesses including:  Aortic atherosclerosis  Dependence on renal dialysis  Secondary hyperparathyroidism renal origin. We have discussed his advanced directives with MOST form filled out.    Past Medical History:  Diagnosis Date   Abnormal CT scan, kidney 10/06/2011   Acute pyelonephritis 10/07/2011   Anemia    normocytic   Anxiety    mental retardation   Bladder wall thickening 10/06/2011   BPH (benign prostatic hypertrophy)    Diabetes mellitus    Dialysis patient ([place])    Tuesday, Thursday and Saturday,    DVT of leg (deep venous thrombosis) ([place]) 12/25/2016   Edema     history of lower extremity edema   GERD (gastroesophageal reflux disease)    Heme positive stool    Hydronephrosis    Hyperkalemia    Hyperlipidemia    Hypernatremia    Hypertension    Hypothyroidism    Impaired speech    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[place])    MR (mental retardation)    Muscle weakness    Obstructive uropathy    Perinephric abscess 10/07/2011   Poor historian poor historian   Primary colorectal adenocarcinoma ([place]) 04/2017   S/p total colectomy   Protein calorie malnutrition ([place])    Pyelonephritis    Renal failure (ARF), acute on chronic (HCC)    Renal insufficiency    chronic history   Sepsis ([place])    Smoking    Uremia    Urinary retention     UTI (lower urinary tract infection) 10/06/2011    Past Surgical History:  Procedure Laterality Date   A/V FISTULAGRAM N/A 08/13/2018   Procedure: A/V FISTULAGRAM - Right Upper;  Surgeon: Elam Dutch, MD;  Location: [facility];  Service: Cardiovascular;  Laterality: N/A;   A/V FISTULAGRAM N/A 11/22/2018   Procedure: A/V FISTULAGRAM - Right Upper;  Surgeon: Waynetta Sandy, MD;  Location: [facility];  Service: Cardiovascular;  Laterality: N/A;   A/V FISTULAGRAM Left 10/08/2022   Procedure: A/V Fistulagram;  Surgeon: Broadus John, MD;  Location: [facility];  Service: Cardiovascular;  Laterality: Left;   AV FISTULA PLACEMENT Left 07/06/2015   Procedure:  INSERTION LEFT ARM ARTERIOVENOUS GORTEX GRAFT;  Surgeon: Angelia Mould, MD;  Location: [place];  Service: Vascular;  Laterality: Left;   AV FISTULA PLACEMENT Right 02/26/2016   Procedure: ARTERIOVENOUS (AV) FISTULA CREATION ;  Surgeon: Angelia Mould, MD;  Location: [facility];  Service: Vascular;  Laterality: Right;   AV FISTULA PLACEMENT Right 11/25/2018   Procedure: INSERTION OF ARTERIOVENOUS (AV) ARTEGRAFT RIGHT UPPER ARM;  Surgeon: Waynetta Sandy, MD;  Location: [facility];  Service: Vascular;  Laterality: Right;   AV FISTULA PLACEMENT Left 05/28/2021   Procedure: LEFT ARM ARTERIOVENOUS (AV) FISTULA;  Surgeon: Rosetta Posner,  MD;  [facility];  Service: Vascular;  Laterality: Left;   [place] Left 10/09/2015   Procedure: REMOVAL OF ARTERIOVENOUS GORETEX GRAFT ([place]) Evacuation of Lymphocele, Vein Patch angioplasty of brachial artery.;  Surgeon: Angelia Mould, MD;  Location: [facility];  Service: Vascular;  Laterality: Left;   [place] Right 02/26/2016   Procedure: Right [place];  Surgeon: Angelia Mould, MD;  Location: [facility];  Service: Vascular;  Laterality: Right;   [place] Left 07/30/2021   Procedure: LEFT  ARM SECOND STAGE BASILIC VEIN TRANSPOSITION;  Surgeon: Rosetta Posner, MD;  Location[facility];  Service: Vascular;  Laterality: Left;   CIRCUMCISION N/A 01/04/2014   Procedure: CIRCUMCISION ADULT (procedure #1);  Surgeon: Marissa Nestle, MD;  [facility];  Service: Urology;  Laterality: N/A;   COLECTOMY N/A 05/04/2017   Procedure: TOTAL COLECTOMY;  Surgeon: Aviva Signs, MD;  Location[facility];  Service: General;  Laterality: N/A;   COLONOSCOPY N/A 04/27/2017   Surgeon: Daneil Dolin, MD; annular mass in the ascending colon likely representing cancer biopsied, multiple 6-22 mm polyps removed, clean rectum.  Pathology with multiple tubular adenomas, high-grade dysplasia noted in ascending colon and splenic flexure.   CYSTOSCOPY W/ RETROGRADES Bilateral 06/29/2015   Procedure: CYSTOSCOPY, DILATION OF URETHRAL STRICTURE WITH BILATERAL RETROGRADE PYELOGRAM,SUPRAPUBIC TUBE CHANGE;  Surgeon: Festus Aloe, MD;  [facility];  Service: Urology;  Laterality: Bilateral;   CYSTOSCOPY WITH URETHRAL DILATATION N/A 12/29/2013   Procedure: CYSTOSCOPY WITH URETHRAL DILATATION;  Surgeon: Marissa Nestle, MD;  [facility];  Service: Urology;  Laterality: N/A;   ESOPHAGOGASTRODUODENOSCOPY N/A 04/27/2017   Procedure: ESOPHAGOGASTRODUODENOSCOPY (EGD);  Surgeon: Daneil Dolin, MD;  [facility];  Service: Endoscopy;  Laterality: N/A;   FLEXIBLE SIGMOIDOSCOPY N/A 12/02/2021   Procedure: FLEXIBLE SIGMOIDOSCOPY;  Surgeon: Eloise Harman, DO;  [facility];  Service: Endoscopy;  Laterality: N/A;  10:30am, dialysis patient   INSERTION OF DIALYSIS CATHETER Right 11/25/2018   Procedure: INSERTION OF DIALYSIS CATHETER RIGHT INTERNAL JUGULAR;  Surgeon: Waynetta Sandy, MD;  Location: [facility];  Service: Vascular;  Laterality: Right;   IR AV DIALY SHUNT INTRO NEEDLE/INTRACATH INITIAL W/PTA/IMG RIGHT Right 09/07/2018   IR FLUORO GUIDE CV LINE RIGHT  10/16/2020   IR FLUORO  GUIDE CV LINE RIGHT  10/03/2022   IR REMOVAL TUN CV CATH W/O FL  01/12/2019   IR THROMBECTOMY AV FISTULA W/THROMBOLYSIS/PTA INC/SHUNT/IMG RIGHT Right 04/26/2018   IR US GUIDE VASC ACCESS RIGHT  04/26/2018   IR US GUIDE VASC ACCESS RIGHT  09/07/2018   IR US GUIDE VASC ACCESS RIGHT  10/16/2020   IR US GUIDE VASC ACCESS RIGHT  10/16/2020   IR US GUIDE VASC ACCESS RIGHT  10/03/2022   ORIF FEMUR FRACTURE Right 11/22/2016   Procedure: OPEN REDUCTION INTERNAL FIXATION (ORIF) DISTAL FEMUR FRACTURE;  Surgeon: Rod Can, MD;  Location: [facility];  Service: Orthopedics;  Laterality: Right;   PATCH ANGIOPLASTY Right 12/10/2017   Procedure: PATCH ANGIOPLASTY;  Surgeon: Angelia Mould, MD;  Location: [facility];  Service: Vascular;  Laterality: Right;   PERIPHERAL VASCULAR BALLOON ANGIOPLASTY  08/13/2018   Procedure: PERIPHERAL VASCULAR BALLOON ANGIOPLASTY;  Surgeon: Elam Dutch, MD;  Location: [facility];  Service: Cardiovascular;;  right AV fistula    PERIPHERAL VASCULAR BALLOON ANGIOPLASTY  11/22/2018   Procedure: PERIPHERAL VASCULAR BALLOON ANGIOPLASTY;  Surgeon: Waynetta Sandy, MD;  Location: [facility];  Service: Cardiovascular;;  rt AV fistula   PERIPHERAL VASCULAR BALLOON ANGIOPLASTY  10/08/2022   Procedure: PERIPHERAL VASCULAR BALLOON ANGIOPLASTY;  Surgeon: Broadus John, MD;  Location: [facility];  Service: Cardiovascular;;  arterial anastamosis   PERIPHERAL VASCULAR CATHETERIZATION N/A 10/08/2015   Procedure: A/V Shuntogram;  Surgeon: Angelia Mould, MD;  Location: [facility];  Service: Cardiovascular;  Laterality: N/A;   PERIPHERAL VASCULAR INTERVENTION Left 10/08/2022   Procedure: PERIPHERAL VASCULAR INTERVENTION;  Surgeon: Broadus John, MD;  Location: [facility];  Service: Cardiovascular;  Laterality: Left;  left AVF   REMOVAL OF A DIALYSIS CATHETER N/A 12/03/2021   Procedure: MINOR REMOVAL OF A TUNNELED DIALYSIS CATHETER;   Surgeon: Rosetta Posner, MD;  [facility];  Service: Vascular;  Laterality: N/A;   REMOVAL OF A DIALYSIS CATHETER N/A 11/18/2022   Procedure: MINOR REMOVAL OF A TUNNELED DIALYSIS CATHETER;  Surgeon: Rosetta Posner, MD;  [facility];  Service: Vascular;  Laterality: N/A;   THROMBECTOMY W/ EMBOLECTOMY Right 12/10/2017   Procedure: THROMBECTOMY REVISION RIGHT ARM  ARTERIOVENOUS FISTULA;  Surgeon: Angelia Mould, MD;  Location: [facility];  Service: Vascular;  Laterality: Right;   TRANSURETHRAL RESECTION OF PROSTATE N/A 01/04/2014   Procedure: TRANSURETHRAL RESECTION OF THE PROSTATE (TURP) (procedure #2);  Surgeon: Marissa Nestle, MD;  [facility];  Service: Urology;  Laterality: N/A;    Social History   Socioeconomic History   Marital status: Single    Spouse name: Not on file   Number of children: Not on file   Years of education: Not on file   Highest education level: Not on file  Occupational History   Occupation: retired   Tobacco Use   Smoking status: Never   Smokeless tobacco: Never  Vaping Use   Vaping Use: Never used  Substance and Sexual Activity   Alcohol use: No   Drug use: No   Sexual activity: Not Currently  Other Topics Concern   Not on file  Social History Narrative   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   [facility] resident [facility]    Social Determinants of Health   Financial Resource Strain: Low Risk  (02/18/2018)   Overall Financial Resource Strain (CARDIA)    Difficulty of Paying Living Expenses: Not hard at all  Food Insecurity: No Food Insecurity (02/18/2018)   Hunger Vital Sign    Worried About Running Out of Food in the Last Year: Never true    Ran Out of Food in the Last Year: Never true  Transportation Needs: No Transportation Needs (02/18/2018)   PRAPARE - Hydrologist (Medical): No    Lack of Transportation (Non-Medical): No  Physical Activity: Unknown (09/15/2019)    Exercise Vital Sign    Days of Exercise per Week: Not on file    Minutes of Exercise per Session: 0 min  Stress: No Stress Concern Present (02/18/2018)   [place]    Feeling of Stress : Not at all  Social Connections: Unknown (09/15/2019)   Social Connection and Isolation Panel [NHANES]    Frequency of Communication with Friends and Family: Not on file    Frequency of Social Gatherings with Friends and Family: Never    Attends Religious Services: Not on Diplomatic Services operational officer of Clubs or Organizations: Not on file    Attends Archivist Meetings: Not  on file    Marital Status: Not on file  Intimate Partner Violence: Not At Risk (02/18/2018)   Humiliation, Afraid, Rape, and Kick questionnaire    Fear of Current or Ex-Partner: No    Emotionally Abused: No    Physically Abused: No    Sexually Abused: No   Family History  Problem Relation Age of Onset   Cancer Mother    Colon cancer Neg Hx       VITAL SIGNS BP 122/68   Pulse 82   Temp 98 F (36.7 C)   Resp 20   Ht 5' 8"$  (1.727 m)   Wt 205 lb 12.8 oz (93.4 kg)   SpO2 97%   BMI 31.29 kg/m   Outpatient Encounter Medications as of 12/12/2022  Medication Sig   aspirin EC 81 MG tablet Take 81 mg by mouth daily. 0900   atorvastatin (LIPITOR) 20 MG tablet Take 20 mg by mouth at bedtime. 2000   Insulin Glargine (BASAGLAR KWIKPEN) 100 UNIT/ML Inject 8 Units into the skin at bedtime. 2100 DO NOT HOLD this insulin without provider notification   Insulin Pen Needle (BD AUTOSHIELD DUO) 30G X 5 MM MISC by Does not apply route. 3/16"   levothyroxine (SYNTHROID) 125 MCG tablet Take 125 mcg by mouth at bedtime. 2100   midodrine (PROAMATINE) 10 MG tablet Take 10 mg by mouth See admin instructions. Send med with resident on  Tues/Thurs/Sat to dialysis. do not give at [facility], give to resident to take with him. send with 73m to equal 167m 6:00   midodrine  (PROAMATINE) 5 MG tablet Take 5 mg by mouth See admin instructions. Send with resident on dialysis days.  Tues/Thurs/Sat  Take along with 10 mg to equal 15 mg 6:00   NON FORMULARY Diet: NAS, Cons CHO   omega-3 acid ethyl esters (LOVAZA) 1 g capsule Take 2 g by mouth at bedtime. 2100   omeprazole (PRILOSEC) 40 MG capsule Take 40 mg by mouth daily. 0900   polyethylene glycol (MIRALAX / GLYCOLAX) packet Take 17 g by mouth daily as needed for mild constipation.    sevelamer carbonate (RENVELA) 800 MG tablet Take 3 tablets (2,400 mg total) by mouth 3 (three) times daily with meals.   tamsulosin (FLOMAX) 0.4 MG CAPS capsule Take 0.4 mg by mouth daily at 6 PM. Give 30 minutes after a meal. Do not crush or chew   torsemide (DEMADEX) 20 MG tablet Take 20 mg by mouth as directed. Sunday, Monday, Wednesday, and Friday   vitamin B-12 (CYANOCOBALAMIN) 1000 MCG tablet Take 1,000 mcg by mouth daily.   No facility-administered encounter medications on file as of 12/12/2022.     SIGNIFICANT DIAGNOSTIC EXAMS   PREVIOUS   12-30-21: ct of abdomen and pelvis:  1. No significant change in size or appearance of the hypodense lesion in the head of the pancreas, possible cyst or IPMN. Recommend continued follow-up with contrast-enhanced MRI or CT in 2 years. 2. Cholelithiasis. 3. Left renal calculus. Bilateral renal mild atrophy with multiple renal cysts. 4. Other chronic findings as described.  06-11-22: dexa: t score: -2.052  NO NEW EXAMS.    LABS REVIEWED PREVIOUS;     12-02-21: hgb 10.5; hct 31.0; glucose 85; bun 25; creat 9.80; k+ 3.9; na++ 140;   01-28-22; wbc 4.8; hgb 8.7; hct 26.0; mcv 105.3; plt 113; glucose 1; bun 40; creat 11.35; k+ 3.8; na++ 141; ca 8.8; GFR 4 d-dimer 0.43; CRP <0.5  06-19-22: hgb a1c 5.8; tsh  2.539 free t4: 0.98 09-29-22: wbc 4.3; hgb 12.9; hct 41.1; mcv 108.4 plt 108; glucose 112; bun 44; creat 16.87; k+ 4.6; na++ 139; ca 9.3 gfr 3; protein 7.0; albumin 3.5; hgb A1c 5.0; tsh 5.861 chol  89; ldl 41; trig 68; hdl 35 10-08-22: glucose 101; bun 22; creat 12.30; k+ 5.2; na++ 141; ion ca 1.14;   NO NEW LABS   Review of Systems  Constitutional:  Negative for malaise/fatigue.  Respiratory:  Negative for cough and shortness of breath.   Cardiovascular:  Negative for chest pain, palpitations and leg swelling.  Gastrointestinal:  Negative for abdominal pain, constipation and heartburn.  Musculoskeletal:  Negative for back pain, joint pain and myalgias.  Skin: Negative.   Neurological:  Negative for dizziness.  Psychiatric/Behavioral:  The patient is not nervous/anxious.    Physical Exam Constitutional:      General: He is not in acute distress.    Appearance: He is well-developed. He is not diaphoretic.  Neck:     Thyroid: No thyromegaly.  Cardiovascular:     Rate and Rhythm: Normal rate and regular rhythm.     Pulses: Normal pulses.     Heart sounds: Normal heart sounds.  Pulmonary:     Effort: Pulmonary effort is normal. No respiratory distress.     Breath sounds: Normal breath sounds.  Abdominal:     General: Bowel sounds are normal. There is no distension.     Palpations: Abdomen is soft.     Tenderness: There is no abdominal tenderness.  Musculoskeletal:        General: Normal range of motion.     Cervical back: Neck supple.     Right lower leg: No edema.     Left lower leg: No edema.  Lymphadenopathy:     Cervical: No cervical adenopathy.  Skin:    General: Skin is warm and dry.     Comments: Left arm fistula +thrill+bruit   Neurological:     Mental Status: He is alert. Mental status is at baseline.  Psychiatric:        Mood and Affect: Mood normal.       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 (20 minutes MOST FORM filled out)   Ok Edwards NP [facility] Adult [facility]  call [phone]

## 2022-12-13 DIAGNOSIS — N2581 Secondary hyperparathyroidism of renal origin: Secondary | ICD-10-CM | POA: Diagnosis not present

## 2022-12-13 DIAGNOSIS — D631 Anemia in chronic kidney disease: Secondary | ICD-10-CM | POA: Diagnosis not present

## 2022-12-13 DIAGNOSIS — N250 Renal osteodystrophy: Secondary | ICD-10-CM | POA: Diagnosis not present

## 2022-12-13 DIAGNOSIS — D509 Iron deficiency anemia, unspecified: Secondary | ICD-10-CM | POA: Diagnosis not present

## 2022-12-13 DIAGNOSIS — Z992 Dependence on renal dialysis: Secondary | ICD-10-CM | POA: Diagnosis not present

## 2022-12-13 DIAGNOSIS — N186 End stage renal disease: Secondary | ICD-10-CM | POA: Diagnosis not present

## 2022-12-16 DIAGNOSIS — N2581 Secondary hyperparathyroidism of renal origin: Secondary | ICD-10-CM | POA: Diagnosis not present

## 2022-12-16 DIAGNOSIS — N186 End stage renal disease: Secondary | ICD-10-CM | POA: Diagnosis not present

## 2022-12-16 DIAGNOSIS — D509 Iron deficiency anemia, unspecified: Secondary | ICD-10-CM | POA: Diagnosis not present

## 2022-12-16 DIAGNOSIS — Z992 Dependence on renal dialysis: Secondary | ICD-10-CM | POA: Diagnosis not present

## 2022-12-16 DIAGNOSIS — N250 Renal osteodystrophy: Secondary | ICD-10-CM | POA: Diagnosis not present

## 2022-12-16 DIAGNOSIS — D631 Anemia in chronic kidney disease: Secondary | ICD-10-CM | POA: Diagnosis not present

## 2022-12-18 DIAGNOSIS — N2581 Secondary hyperparathyroidism of renal origin: Secondary | ICD-10-CM | POA: Diagnosis not present

## 2022-12-18 DIAGNOSIS — N186 End stage renal disease: Secondary | ICD-10-CM | POA: Diagnosis not present

## 2022-12-18 DIAGNOSIS — Z992 Dependence on renal dialysis: Secondary | ICD-10-CM | POA: Diagnosis not present

## 2022-12-18 DIAGNOSIS — D509 Iron deficiency anemia, unspecified: Secondary | ICD-10-CM | POA: Diagnosis not present

## 2022-12-18 DIAGNOSIS — D631 Anemia in chronic kidney disease: Secondary | ICD-10-CM | POA: Diagnosis not present

## 2022-12-18 DIAGNOSIS — N250 Renal osteodystrophy: Secondary | ICD-10-CM | POA: Diagnosis not present

## 2022-12-20 DIAGNOSIS — N250 Renal osteodystrophy: Secondary | ICD-10-CM | POA: Diagnosis not present

## 2022-12-20 DIAGNOSIS — N2581 Secondary hyperparathyroidism of renal origin: Secondary | ICD-10-CM | POA: Diagnosis not present

## 2022-12-20 DIAGNOSIS — D631 Anemia in chronic kidney disease: Secondary | ICD-10-CM | POA: Diagnosis not present

## 2022-12-20 DIAGNOSIS — D509 Iron deficiency anemia, unspecified: Secondary | ICD-10-CM | POA: Diagnosis not present

## 2022-12-20 DIAGNOSIS — N186 End stage renal disease: Secondary | ICD-10-CM | POA: Diagnosis not present

## 2022-12-20 DIAGNOSIS — Z992 Dependence on renal dialysis: Secondary | ICD-10-CM | POA: Diagnosis not present

## 2022-12-23 DIAGNOSIS — D509 Iron deficiency anemia, unspecified: Secondary | ICD-10-CM | POA: Diagnosis not present

## 2022-12-23 DIAGNOSIS — D631 Anemia in chronic kidney disease: Secondary | ICD-10-CM | POA: Diagnosis not present

## 2022-12-23 DIAGNOSIS — N186 End stage renal disease: Secondary | ICD-10-CM | POA: Diagnosis not present

## 2022-12-23 DIAGNOSIS — Z992 Dependence on renal dialysis: Secondary | ICD-10-CM | POA: Diagnosis not present

## 2022-12-23 DIAGNOSIS — N250 Renal osteodystrophy: Secondary | ICD-10-CM | POA: Diagnosis not present

## 2022-12-23 DIAGNOSIS — N2581 Secondary hyperparathyroidism of renal origin: Secondary | ICD-10-CM | POA: Diagnosis not present

## 2022-12-25 DIAGNOSIS — N2581 Secondary hyperparathyroidism of renal origin: Secondary | ICD-10-CM | POA: Diagnosis not present

## 2022-12-25 DIAGNOSIS — Z992 Dependence on renal dialysis: Secondary | ICD-10-CM | POA: Diagnosis not present

## 2022-12-25 DIAGNOSIS — D509 Iron deficiency anemia, unspecified: Secondary | ICD-10-CM | POA: Diagnosis not present

## 2022-12-25 DIAGNOSIS — D631 Anemia in chronic kidney disease: Secondary | ICD-10-CM | POA: Diagnosis not present

## 2022-12-25 DIAGNOSIS — N250 Renal osteodystrophy: Secondary | ICD-10-CM | POA: Diagnosis not present

## 2022-12-25 DIAGNOSIS — N186 End stage renal disease: Secondary | ICD-10-CM | POA: Diagnosis not present

## 2022-12-27 DIAGNOSIS — D509 Iron deficiency anemia, unspecified: Secondary | ICD-10-CM | POA: Diagnosis not present

## 2022-12-27 DIAGNOSIS — N186 End stage renal disease: Secondary | ICD-10-CM | POA: Diagnosis not present

## 2022-12-27 DIAGNOSIS — N2581 Secondary hyperparathyroidism of renal origin: Secondary | ICD-10-CM | POA: Diagnosis not present

## 2022-12-27 DIAGNOSIS — Z992 Dependence on renal dialysis: Secondary | ICD-10-CM | POA: Diagnosis not present

## 2022-12-27 DIAGNOSIS — N250 Renal osteodystrophy: Secondary | ICD-10-CM | POA: Diagnosis not present

## 2022-12-27 DIAGNOSIS — D631 Anemia in chronic kidney disease: Secondary | ICD-10-CM | POA: Diagnosis not present

## 2022-12-30 DIAGNOSIS — Z992 Dependence on renal dialysis: Secondary | ICD-10-CM | POA: Diagnosis not present

## 2022-12-30 DIAGNOSIS — N186 End stage renal disease: Secondary | ICD-10-CM | POA: Diagnosis not present

## 2022-12-30 DIAGNOSIS — N2581 Secondary hyperparathyroidism of renal origin: Secondary | ICD-10-CM | POA: Diagnosis not present

## 2022-12-30 DIAGNOSIS — N250 Renal osteodystrophy: Secondary | ICD-10-CM | POA: Diagnosis not present

## 2022-12-30 DIAGNOSIS — D509 Iron deficiency anemia, unspecified: Secondary | ICD-10-CM | POA: Diagnosis not present

## 2022-12-30 DIAGNOSIS — D631 Anemia in chronic kidney disease: Secondary | ICD-10-CM | POA: Diagnosis not present

## 2023-01-01 ENCOUNTER — Encounter: Payer: Self-pay | Admitting: Adult Health

## 2023-01-01 ENCOUNTER — Non-Acute Institutional Stay (SKILLED_NURSING_FACILITY): Payer: Medicare Other | Admitting: Adult Health

## 2023-01-01 DIAGNOSIS — N186 End stage renal disease: Secondary | ICD-10-CM | POA: Diagnosis not present

## 2023-01-01 DIAGNOSIS — K219 Gastro-esophageal reflux disease without esophagitis: Secondary | ICD-10-CM

## 2023-01-01 DIAGNOSIS — E1169 Type 2 diabetes mellitus with other specified complication: Secondary | ICD-10-CM

## 2023-01-01 DIAGNOSIS — N250 Renal osteodystrophy: Secondary | ICD-10-CM | POA: Diagnosis not present

## 2023-01-01 DIAGNOSIS — Z992 Dependence on renal dialysis: Secondary | ICD-10-CM | POA: Diagnosis not present

## 2023-01-01 DIAGNOSIS — M8580 Other specified disorders of bone density and structure, unspecified site: Secondary | ICD-10-CM | POA: Diagnosis not present

## 2023-01-01 DIAGNOSIS — E785 Hyperlipidemia, unspecified: Secondary | ICD-10-CM

## 2023-01-01 DIAGNOSIS — N2581 Secondary hyperparathyroidism of renal origin: Secondary | ICD-10-CM | POA: Diagnosis not present

## 2023-01-01 DIAGNOSIS — Z85038 Personal history of other malignant neoplasm of large intestine: Secondary | ICD-10-CM

## 2023-01-01 DIAGNOSIS — D509 Iron deficiency anemia, unspecified: Secondary | ICD-10-CM | POA: Diagnosis not present

## 2023-01-01 DIAGNOSIS — D631 Anemia in chronic kidney disease: Secondary | ICD-10-CM | POA: Diagnosis not present

## 2023-01-01 NOTE — Progress Notes (Signed)
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 He continues with dialysis three days per week. There are no reports of uncontrolled pain. He continues to get out of bed daily; and does socialize with others.   Past Medical History:  Diagnosis Date   Abnormal CT scan, kidney 10/06/2011   Acute pyelonephritis 10/07/2011   Anemia    normocytic   Anxiety    mental retardation   Bladder wall thickening 10/06/2011   BPH (benign prostatic hypertrophy)    Diabetes mellitus    Dialysis patient ([place])    Tuesday, Thursday and Saturday,    DVT of leg (deep venous thrombosis) ([place]) 12/25/2016   Edema     history of lower extremity edema   GERD (gastroesophageal reflux disease)    Heme positive stool    Hydronephrosis    Hyperkalemia    Hyperlipidemia    Hypernatremia    Hypertension    Hypothyroidism    Impaired speech    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[place])    MR (mental retardation)    Muscle weakness    Obstructive uropathy    Perinephric abscess 10/07/2011   Poor historian poor historian   Primary colorectal adenocarcinoma ([place]) 04/2017   S/p total colectomy   Protein calorie malnutrition ([place])    Pyelonephritis    Renal failure (ARF), acute on chronic (HCC)    Renal insufficiency    chronic history   Sepsis ([place])    Smoking    Uremia    Urinary retention    UTI (lower urinary tract infection) 10/06/2011    Past Surgical  History:  Procedure Laterality Date   A/V FISTULAGRAM N/A 08/13/2018   Procedure: A/V FISTULAGRAM - Right Upper;  Surgeon: Elam Dutch, MD;  Location: [facility];  Service: Cardiovascular;  Laterality: N/A;   A/V FISTULAGRAM N/A 11/22/2018   Procedure: A/V FISTULAGRAM - Right Upper;  Surgeon: Waynetta Sandy, MD;  Location: [facility];  Service: Cardiovascular;  Laterality: N/A;   A/V FISTULAGRAM Left 10/08/2022   Procedure: A/V Fistulagram;  Surgeon: Broadus John, MD;  Location: [facility];  Service: Cardiovascular;  Laterality: Left;   AV FISTULA PLACEMENT Left 07/06/2015   Procedure:  INSERTION LEFT ARM ARTERIOVENOUS GORTEX GRAFT;  Surgeon: Angelia Mould, MD;  Location: [facility];  Service: Vascular;  Laterality: Left;   AV FISTULA PLACEMENT Right 02/26/2016   Procedure: ARTERIOVENOUS (AV) FISTULA CREATION ;  Surgeon: Angelia Mould, MD;  Location: [facility];  Service: Vascular;  Laterality: Right;   AV FISTULA PLACEMENT Right 11/25/2018   Procedure: INSERTION OF ARTERIOVENOUS (AV) ARTEGRAFT RIGHT UPPER ARM;  Surgeon: Waynetta Sandy, MD;  Location: [facility];  Service: Vascular;  Laterality: Right;   AV FISTULA PLACEMENT Left 05/28/2021   Procedure: LEFT ARM ARTERIOVENOUS (AV) FISTULA;  Surgeon: Curt Jews  F, MD;  Location: [facility];  Service: Vascular;  Laterality: Left;   [place] Left 10/09/2015   Procedure: REMOVAL OF ARTERIOVENOUS GORETEX GRAFT ([place]) Evacuation of Lymphocele, Vein Patch angioplasty of brachial artery.;  Surgeon: Angelia Mould, MD;  Location: [facility];  Service: Vascular;  Laterality: Left;   [place] Right 02/26/2016   Procedure: Right [place];  Surgeon: Angelia Mould, MD;  Location: [facility];  Service: Vascular;  Laterality: Right;   [place] Left 07/30/2021   Procedure: LEFT ARM SECOND STAGE BASILIC VEIN TRANSPOSITION;  Surgeon: Rosetta Posner, MD;  Location[facility];  Service: Vascular;  Laterality: Left;   CIRCUMCISION N/A 01/04/2014   Procedure: CIRCUMCISION ADULT (procedure #1);  Surgeon: Marissa Nestle, MD;  [facility];  Service: Urology;  Laterality: N/A;   COLECTOMY N/A 05/04/2017   Procedure: TOTAL COLECTOMY;  Surgeon: Aviva Signs, MD;  [facility];  Service: General;  Laterality: N/A;   COLONOSCOPY N/A 04/27/2017   Surgeon: Daneil Dolin, MD; annular mass in the ascending colon likely representing cancer biopsied, multiple 6-22 mm polyps removed, clean rectum.  Pathology with multiple tubular adenomas, high-grade dysplasia noted in ascending colon and splenic flexure.   CYSTOSCOPY W/ RETROGRADES Bilateral 06/29/2015   Procedure: CYSTOSCOPY, DILATION OF URETHRAL STRICTURE WITH BILATERAL RETROGRADE PYELOGRAM,SUPRAPUBIC TUBE CHANGE;  Surgeon: Festus Aloe, MD;  [facility];  Service: Urology;  Laterality: Bilateral;   CYSTOSCOPY WITH URETHRAL DILATATION N/A 12/29/2013   Procedure: CYSTOSCOPY WITH URETHRAL DILATATION;  Surgeon: Marissa Nestle, MD;  [facility];  Service: Urology;  Laterality: N/A;   ESOPHAGOGASTRODUODENOSCOPY N/A 04/27/2017   Procedure: ESOPHAGOGASTRODUODENOSCOPY (EGD);  Surgeon: Daneil Dolin, MD;  [facility];  Service: Endoscopy;  Laterality: N/A;   FLEXIBLE SIGMOIDOSCOPY N/A 12/02/2021   Procedure: FLEXIBLE SIGMOIDOSCOPY;  Surgeon: Eloise Harman, DO;  [facility];  Service: Endoscopy;  Laterality: N/A;  10:30am, dialysis patient   INSERTION OF DIALYSIS CATHETER Right 11/25/2018   Procedure: INSERTION OF DIALYSIS CATHETER RIGHT INTERNAL JUGULAR;  Surgeon: Waynetta Sandy, MD;  Location: [facility];  Service: Vascular;  Laterality: Right;   IR AV DIALY SHUNT INTRO NEEDLE/INTRACATH INITIAL W/PTA/IMG RIGHT Right 09/07/2018   IR FLUORO GUIDE CV LINE RIGHT  10/16/2020   IR FLUORO GUIDE CV LINE RIGHT  10/03/2022   IR REMOVAL TUN CV CATH W/O FL   01/12/2019   IR THROMBECTOMY AV FISTULA W/THROMBOLYSIS/PTA INC/SHUNT/IMG RIGHT Right 04/26/2018   IR US GUIDE VASC ACCESS RIGHT  04/26/2018   IR US GUIDE VASC ACCESS RIGHT  09/07/2018   IR US GUIDE VASC ACCESS RIGHT  10/16/2020   IR US GUIDE VASC ACCESS RIGHT  10/16/2020   IR US GUIDE VASC ACCESS RIGHT  10/03/2022   ORIF FEMUR FRACTURE Right 11/22/2016   Procedure: OPEN REDUCTION INTERNAL FIXATION (ORIF) DISTAL FEMUR FRACTURE;  Surgeon: Rod Can, MD;  Location: [facility];  Service: Orthopedics;  Laterality: Right;   PATCH ANGIOPLASTY Right 12/10/2017   Procedure: PATCH ANGIOPLASTY;  Surgeon: Angelia Mould, MD;  Location: [facility];  Service: Vascular;  Laterality: Right;   PERIPHERAL VASCULAR BALLOON ANGIOPLASTY  08/13/2018   Procedure: PERIPHERAL VASCULAR BALLOON ANGIOPLASTY;  Surgeon: Elam Dutch, MD;  Location: [facility];  Service: Cardiovascular;;  right AV fistula    PERIPHERAL VASCULAR BALLOON ANGIOPLASTY  11/22/2018   Procedure: PERIPHERAL VASCULAR BALLOON ANGIOPLASTY;  Surgeon: Waynetta Sandy, MD;  Location: [facility];  Service: Cardiovascular;;  rt AV fistula   PERIPHERAL VASCULAR BALLOON ANGIOPLASTY  10/08/2022   Procedure: PERIPHERAL VASCULAR BALLOON ANGIOPLASTY;  Surgeon: Broadus John, MD;  Location: [facility];  Service: Cardiovascular;;  arterial anastamosis   PERIPHERAL VASCULAR CATHETERIZATION N/A 10/08/2015   Procedure: A/V Shuntogram;  Surgeon: Angelia Mould, MD;  Location: [facility];  Service: Cardiovascular;  Laterality: N/A;   PERIPHERAL VASCULAR INTERVENTION Left 10/08/2022   Procedure: PERIPHERAL VASCULAR INTERVENTION;  Surgeon: Broadus John, MD;  Location: [facility];  Service: Cardiovascular;  Laterality: Left;  left AVF   REMOVAL OF A DIALYSIS CATHETER N/A 12/03/2021   Procedure: MINOR REMOVAL OF A TUNNELED DIALYSIS CATHETER;  Surgeon: Rosetta Posner, MD;  [facility];  Service:  Vascular;  Laterality: N/A;   REMOVAL OF A DIALYSIS CATHETER N/A 11/18/2022   Procedure: MINOR REMOVAL OF A TUNNELED DIALYSIS CATHETER;  Surgeon: Rosetta Posner, MD;  [facility];  Service: Vascular;  Laterality: N/A;   THROMBECTOMY W/ EMBOLECTOMY Right 12/10/2017   Procedure: THROMBECTOMY REVISION RIGHT ARM  ARTERIOVENOUS FISTULA;  Surgeon: Angelia Mould, MD;  Location[facility];  Service: Vascular;  Laterality: Right;   TRANSURETHRAL RESECTION OF PROSTATE N/A 01/04/2014   Procedure: TRANSURETHRAL RESECTION OF THE PROSTATE (TURP) (procedure #2);  Surgeon: Marissa Nestle, MD;  [facility];  Service: Urology;  Laterality: N/A;    Social History   Socioeconomic History   Marital status: Single    Spouse name: Not on file   Number of children: Not on file   Years of education: Not on file   Highest education level: Not on file  Occupational History   Occupation: retired   Tobacco Use   Smoking status: Never   Smokeless tobacco: Never  Vaping Use   Vaping Use: Never used  Substance and Sexual Activity   Alcohol use: No   Drug use: No   Sexual activity: Not Currently  Other Topics Concern   Not on file  Social History Narrative   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   Long term resident of [facility]    Social Determinants of Health   Financial Resource Strain: Low Risk  (02/18/2018)   Overall Financial Resource Strain (CARDIA)    Difficulty of Paying Living Expenses: Not hard at all  Food Insecurity: No Food Insecurity (02/18/2018)   Hunger Vital Sign    Worried About Running Out of Food in the Last Year: Never true    Ran Out of Food in the Last Year: Never true  Transportation Needs: No Transportation Needs (02/18/2018)   PRAPARE - Hydrologist (Medical): No    Lack of Transportation (Non-Medical): No  Physical Activity: Unknown (09/15/2019)   Exercise Vital Sign    Days of Exercise per Week: Not  on file    Minutes of Exercise per Session: 0 min  Stress: No Stress Concern Present (02/18/2018)   [place]    Feeling of Stress : Not at all  Social Connections: Unknown (09/15/2019)   Social Connection and Isolation Panel [NHANES]    Frequency of Communication with Friends and Family: Not on file    Frequency of Social Gatherings with Friends and Family: Never    Attends Religious Services: Not on Diplomatic Services operational officer of Clubs or Organizations: Not on file    Attends Archivist  Meetings: Not on file    Marital Status: Not on file  Intimate Partner Violence: Not At Risk (02/18/2018)   Humiliation, Afraid, Rape, and Kick questionnaire    Fear of Current or Ex-Partner: No    Emotionally Abused: No    Physically Abused: No    Sexually Abused: No   Family History  Problem Relation Age of Onset   Cancer Mother    Colon cancer Neg Hx       VITAL SIGNS BP 122/68   Pulse 72   Temp 98.2 F (36.8 C)   Resp 20   Ht '5\' 8"'$  (1.727 m)   Wt 210 lb 3.2 oz (95.3 kg)   SpO2 98%   BMI 31.96 kg/m   Outpatient Encounter Medications as of 01/01/2023  Medication Sig   aspirin EC 81 MG tablet Take 81 mg by mouth daily. 0900   atorvastatin (LIPITOR) 20 MG tablet Take 20 mg by mouth at bedtime. 2000   Insulin Glargine (BASAGLAR KWIKPEN) 100 UNIT/ML Inject 8 Units into the skin at bedtime. 2100 DO NOT HOLD this insulin without provider notification   Insulin Pen Needle (BD AUTOSHIELD DUO) 30G X 5 MM MISC by Does not apply route. 3/16"   levothyroxine (SYNTHROID) 125 MCG tablet Take 125 mcg by mouth at bedtime. 2100   midodrine (PROAMATINE) 10 MG tablet Take 10 mg by mouth See admin instructions. Send med with resident on  Tues/Thurs/Sat to dialysis. do not give [facility], give to resident to take with him. send with '5mg'$  to equal '15mg'$ . 6:00   midodrine (PROAMATINE) 5 MG tablet Take 5 mg by mouth See admin  instructions. Send with resident on dialysis days.  Tues/Thurs/Sat  Take along with 10 mg to equal 15 mg 6:00   NON FORMULARY Diet: NAS, Cons CHO   omega-3 acid ethyl esters (LOVAZA) 1 g capsule Take 2 g by mouth at bedtime. 2100   omeprazole (PRILOSEC) 40 MG capsule Take 40 mg by mouth daily. 0900   polyethylene glycol (MIRALAX / GLYCOLAX) packet Take 17 g by mouth daily as needed for mild constipation.    sevelamer carbonate (RENVELA) 800 MG tablet Take 3 tablets (2,400 mg total) by mouth 3 (three) times daily with meals.   tamsulosin (FLOMAX) 0.4 MG CAPS capsule Take 0.4 mg by mouth daily at 6 PM. Give 30 minutes after a meal. Do not crush or chew   torsemide (DEMADEX) 20 MG tablet Take 20 mg by mouth as directed. Sunday, Monday, Wednesday, and Friday   vitamin B-12 (CYANOCOBALAMIN) 1000 MCG tablet Take 1,000 mcg by mouth daily.   No facility-administered encounter medications on file as of 01/01/2023.     SIGNIFICANT DIAGNOSTIC EXAMS  PREVIOUS   12-30-21: ct of abdomen and pelvis:  1. No significant change in size or appearance of the hypodense lesion in the head of the pancreas, possible cyst or IPMN. Recommend continued follow-up with contrast-enhanced MRI or CT in 2 years. 2. Cholelithiasis. 3. Left renal calculus. Bilateral renal mild atrophy with multiple renal cysts. 4. Other chronic findings as described.  06-11-22: dexa: t score: -2.052  NO NEW EXAMS.    LABS REVIEWED PREVIOUS;      01-28-22; wbc 4.8; hgb 8.7; hct 26.0; mcv 105.3; plt 113; glucose 1; bun 40; creat 11.35; k+ 3.8; na++ 141; ca 8.8; GFR 4 d-dimer 0.43; CRP <0.5  06-19-22: hgb a1c 5.8; tsh 2.539 free t4: 0.98 09-29-22: wbc 4.3; hgb 12.9; hct 41.1; mcv 108.4 plt 108;  glucose 112; bun 44; creat 16.87; k+ 4.6; na++ 139; ca 9.3 gfr 3; protein 7.0; albumin 3.5; hgb A1c 5.0; tsh 5.861 chol 89; ldl 41; trig 68; hdl 35 10-08-22: glucose 101; bun 22; creat 12.30; k+ 5.2; na++ 141; ion ca 1.14;   NO NEW LABS.   Review of  Systems  Constitutional:  Negative for malaise/fatigue.  Respiratory:  Negative for cough and shortness of breath.   Cardiovascular:  Negative for chest pain, palpitations and leg swelling.  Gastrointestinal:  Negative for abdominal pain, constipation and heartburn.  Musculoskeletal:  Negative for back pain, joint pain and myalgias.  Skin: Negative.   Neurological:  Negative for dizziness.  Psychiatric/Behavioral:  The patient is not nervous/anxious.    Physical Exam Constitutional:      General: He is not in acute distress.    Appearance: He is well-developed. He is obese. He is not diaphoretic.  Neck:     Thyroid: No thyromegaly.  Cardiovascular:     Rate and Rhythm: Normal rate and regular rhythm.     Pulses: Normal pulses.     Heart sounds: Normal heart sounds.  Pulmonary:     Effort: Pulmonary effort is normal. No respiratory distress.     Breath sounds: Normal breath sounds.  Abdominal:     General: Bowel sounds are normal. There is no distension.     Palpations: Abdomen is soft.     Tenderness: There is no abdominal tenderness.  Musculoskeletal:        General: Normal range of motion.     Cervical back: Neck supple.     Right lower leg: No edema.     Left lower leg: No edema.  Lymphadenopathy:     Cervical: No cervical adenopathy.  Skin:    General: Skin is warm and dry.     Comments: Left arm fistula +thrill+bruit    Neurological:     Mental Status: He is alert. Mental status is at baseline.  Psychiatric:        Mood and Affect: Mood normal.       ASSESSMENT/ PLAN:  TODAY  Osteopenia: t score -2.052 on supplements  2. GERD without esophagitis: will continue prilosec 40 mg daily   3. History of colon cancder: s/p colectomy last seen by GI on 12-19-21; ct scan done 12-30-21  4. Dyslipidemia associate with type 2 diabetes mellitus: ldl 41; trig 63; will continue lipitor 20 mg daily lovaza 2 gm daily    PREVIOUS   5. Vitamin B 12 deficiency; is on daily  supplement  6. Secondary hyperparathyroidism of renal origin: will monitor  7. Bilateral lower extremity edema: will continue demedex 20 mg 4 times weekly   8. Type 2 diabetes mellitus with hypertension and end stage renal disease on hemodialysis: hgb a1c 5.0 will continue basaglar 8 units nightly is on  asa and statin  9. Thrombocytopenia: platelet 108  10. Aortic atherosclerosis (ct 12-30-21) is on asa statin   11. 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: is on hemodialysis three days weekly 1200 cc fluid restriction; does not adhere; midodrine 15 mg with dialysis; renvela 2400 mg three times daily   12. Other specified hypothyroidism: tsh 5.861 will continue synthroid 125 mcg daily   13. 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 Major depression single episode moderate: is presently off medications.     Ok Edwards NP [facility]   call  [phone]   

## 2023-01-03 DIAGNOSIS — D631 Anemia in chronic kidney disease: Secondary | ICD-10-CM | POA: Diagnosis not present

## 2023-01-03 DIAGNOSIS — D509 Iron deficiency anemia, unspecified: Secondary | ICD-10-CM | POA: Diagnosis not present

## 2023-01-03 DIAGNOSIS — N186 End stage renal disease: Secondary | ICD-10-CM | POA: Diagnosis not present

## 2023-01-03 DIAGNOSIS — N2581 Secondary hyperparathyroidism of renal origin: Secondary | ICD-10-CM | POA: Diagnosis not present

## 2023-01-03 DIAGNOSIS — N250 Renal osteodystrophy: Secondary | ICD-10-CM | POA: Diagnosis not present

## 2023-01-03 DIAGNOSIS — Z992 Dependence on renal dialysis: Secondary | ICD-10-CM | POA: Diagnosis not present

## 2023-01-06 DIAGNOSIS — D631 Anemia in chronic kidney disease: Secondary | ICD-10-CM | POA: Diagnosis not present

## 2023-01-06 DIAGNOSIS — N2581 Secondary hyperparathyroidism of renal origin: Secondary | ICD-10-CM | POA: Diagnosis not present

## 2023-01-06 DIAGNOSIS — D509 Iron deficiency anemia, unspecified: Secondary | ICD-10-CM | POA: Diagnosis not present

## 2023-01-06 DIAGNOSIS — N186 End stage renal disease: Secondary | ICD-10-CM | POA: Diagnosis not present

## 2023-01-06 DIAGNOSIS — Z992 Dependence on renal dialysis: Secondary | ICD-10-CM | POA: Diagnosis not present

## 2023-01-06 DIAGNOSIS — N250 Renal osteodystrophy: Secondary | ICD-10-CM | POA: Diagnosis not present

## 2023-01-08 DIAGNOSIS — D631 Anemia in chronic kidney disease: Secondary | ICD-10-CM | POA: Diagnosis not present

## 2023-01-08 DIAGNOSIS — D509 Iron deficiency anemia, unspecified: Secondary | ICD-10-CM | POA: Diagnosis not present

## 2023-01-08 DIAGNOSIS — N186 End stage renal disease: Secondary | ICD-10-CM | POA: Diagnosis not present

## 2023-01-08 DIAGNOSIS — Z992 Dependence on renal dialysis: Secondary | ICD-10-CM | POA: Diagnosis not present

## 2023-01-08 DIAGNOSIS — N2581 Secondary hyperparathyroidism of renal origin: Secondary | ICD-10-CM | POA: Diagnosis not present

## 2023-01-08 DIAGNOSIS — N250 Renal osteodystrophy: Secondary | ICD-10-CM | POA: Diagnosis not present

## 2023-01-10 DIAGNOSIS — D631 Anemia in chronic kidney disease: Secondary | ICD-10-CM | POA: Diagnosis not present

## 2023-01-10 DIAGNOSIS — N2581 Secondary hyperparathyroidism of renal origin: Secondary | ICD-10-CM | POA: Diagnosis not present

## 2023-01-10 DIAGNOSIS — N250 Renal osteodystrophy: Secondary | ICD-10-CM | POA: Diagnosis not present

## 2023-01-10 DIAGNOSIS — Z992 Dependence on renal dialysis: Secondary | ICD-10-CM | POA: Diagnosis not present

## 2023-01-10 DIAGNOSIS — D509 Iron deficiency anemia, unspecified: Secondary | ICD-10-CM | POA: Diagnosis not present

## 2023-01-10 DIAGNOSIS — N186 End stage renal disease: Secondary | ICD-10-CM | POA: Diagnosis not present

## 2023-01-13 DIAGNOSIS — N186 End stage renal disease: Secondary | ICD-10-CM | POA: Diagnosis not present

## 2023-01-13 DIAGNOSIS — D631 Anemia in chronic kidney disease: Secondary | ICD-10-CM | POA: Diagnosis not present

## 2023-01-13 DIAGNOSIS — Z992 Dependence on renal dialysis: Secondary | ICD-10-CM | POA: Diagnosis not present

## 2023-01-13 DIAGNOSIS — N2581 Secondary hyperparathyroidism of renal origin: Secondary | ICD-10-CM | POA: Diagnosis not present

## 2023-01-13 DIAGNOSIS — D509 Iron deficiency anemia, unspecified: Secondary | ICD-10-CM | POA: Diagnosis not present

## 2023-01-13 DIAGNOSIS — N250 Renal osteodystrophy: Secondary | ICD-10-CM | POA: Diagnosis not present

## 2023-01-15 ENCOUNTER — Non-Acute Institutional Stay (INDEPENDENT_AMBULATORY_CARE_PROVIDER_SITE_OTHER): Payer: Medicare Other | Admitting: Adult Health

## 2023-01-15 ENCOUNTER — Encounter: Payer: Self-pay | Admitting: Adult Health

## 2023-01-15 DIAGNOSIS — N250 Renal osteodystrophy: Secondary | ICD-10-CM | POA: Diagnosis not present

## 2023-01-15 DIAGNOSIS — Z992 Dependence on renal dialysis: Secondary | ICD-10-CM | POA: Diagnosis not present

## 2023-01-15 DIAGNOSIS — Z0000 Encounter for general adult medical examination without abnormal findings: Secondary | ICD-10-CM | POA: Diagnosis not present

## 2023-01-15 DIAGNOSIS — D509 Iron deficiency anemia, unspecified: Secondary | ICD-10-CM | POA: Diagnosis not present

## 2023-01-15 DIAGNOSIS — D631 Anemia in chronic kidney disease: Secondary | ICD-10-CM | POA: Diagnosis not present

## 2023-01-15 DIAGNOSIS — N186 End stage renal disease: Secondary | ICD-10-CM | POA: Diagnosis not present

## 2023-01-15 DIAGNOSIS — N2581 Secondary hyperparathyroidism of renal origin: Secondary | ICD-10-CM | POA: Diagnosis not present

## 2023-01-15 NOTE — Progress Notes (Signed)
Subjective:   Zachary [name] is a 77 y.o. male who presents for Medicare Annual/Subsequent preventive examination.  Review of Systems    Review of Systems  Constitutional:  Negative for malaise/fatigue.  Respiratory:  Negative for cough and shortness of breath.   Cardiovascular:  Negative for chest pain, palpitations and leg swelling.  Gastrointestinal:  Negative for abdominal pain, constipation and heartburn.  Musculoskeletal:  Negative for back pain, joint pain and myalgias.  Skin: Negative.   Neurological:  Negative for dizziness.  Psychiatric/Behavioral:  The patient is not nervous/anxious.     Cardiac Risk Factors include: advanced age (>62men, >76 women);diabetes mellitus;dyslipidemia;hypertension;male gender;obesity (BMI >30kg/m2)     Objective:    Today's Vitals   01/15/23 1107  BP: 127/72  Pulse: 100  Resp: 18  Temp: 98.5 F (36.9 C)  SpO2: 95%  Weight: 210 lb 3.2 oz (95.3 kg)  Height: 5\' 8"  (1.727 m)   Body mass index is 31.96 kg/m.     01/01/2023   10:47 AM 12/12/2022    9:34 AM 12/01/2022   10:23 AM 10/08/2022    8:06 AM 10/01/2022    3:57 PM 10/01/2022   11:01 AM 09/29/2022   11:35 AM  Advanced Directives  Does Patient Have a Medical Advance Directive? No No No Yes No No No  Type of Advance Directive    [place]     Does patient want to make changes to medical advance directive?    No - Patient declined  No - Patient declined No - Patient declined  Copy of [place] in Chart?    Yes - validated most recent copy scanned in chart (See row information)     Would patient like information on creating a medical advance directive? No - Patient declined No - Patient declined No - Patient declined        Current Medications (verified) Outpatient Encounter Medications as of 01/15/2023  Medication Sig   aspirin EC 81 MG tablet Take 81 mg by mouth daily. 0900   atorvastatin (LIPITOR) 20 MG tablet Take 20 mg by mouth at bedtime.  2000   Insulin Glargine (BASAGLAR KWIKPEN) 100 UNIT/ML Inject 8 Units into the skin at bedtime. 2100 DO NOT HOLD this insulin without provider notification   Insulin Pen Needle (BD AUTOSHIELD DUO) 30G X 5 MM MISC by Does not apply route. 3/16"   levothyroxine (SYNTHROID) 125 MCG tablet Take 125 mcg by mouth at bedtime. 2100   midodrine (PROAMATINE) 10 MG tablet Take 10 mg by mouth See admin instructions. Send med with resident on  Tues/Thurs/Sat to dialysis. do not give [facility], give to resident to take with him. send with 5mg  to equal 15mg . 6:00   midodrine (PROAMATINE) 5 MG tablet Take 5 mg by mouth See admin instructions. Send with resident on dialysis days.  Tues/Thurs/Sat  Take along with 10 mg to equal 15 mg 6:00   NON FORMULARY Diet: NAS, Cons CHO   omega-3 acid ethyl esters (LOVAZA) 1 g capsule Take 2 g by mouth at bedtime. 2100   omeprazole (PRILOSEC) 40 MG capsule Take 40 mg by mouth daily. 0900   ondansetron (ZOFRAN) 8 MG tablet Take 8 mg by mouth every 6 (six) hours as needed for nausea or vomiting.   polyethylene glycol (MIRALAX / GLYCOLAX) packet Take 17 g by mouth daily as needed for mild constipation.    sevelamer carbonate (RENVELA) 800 MG tablet Take 3 tablets (2,400 mg  total) by mouth 3 (three) times daily with meals.   tamsulosin (FLOMAX) 0.4 MG CAPS capsule Take 0.4 mg by mouth daily at 6 PM. Give 30 minutes after a meal. Do not crush or chew   torsemide (DEMADEX) 20 MG tablet Take 20 mg by mouth as directed. Sunday, Monday, Wednesday, and Friday   vitamin B-12 (CYANOCOBALAMIN) 1000 MCG tablet Take 1,000 mcg by mouth daily.   No facility-administered encounter medications on file as of 01/15/2023.    Allergies (verified) Patient has no known allergies.   History: Past Medical History:  Diagnosis Date   Abnormal CT scan, kidney 10/06/2011   Acute pyelonephritis 10/07/2011   Anemia    normocytic   Anxiety    mental retardation   Bladder wall thickening  10/06/2011   BPH (benign prostatic hypertrophy)    Diabetes mellitus    Dialysis patient ([place])    Tuesday, Thursday and Saturday,    DVT of leg (deep venous thrombosis) ([place]) 12/25/2016   Edema     history of lower extremity edema   GERD (gastroesophageal reflux disease)    Heme positive stool    Hydronephrosis    Hyperkalemia    Hyperlipidemia    Hypernatremia    Hypertension    Hypothyroidism    Impaired speech    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[place])    MR (mental retardation)    Muscle weakness    Obstructive uropathy    Perinephric abscess 10/07/2011   Poor historian poor historian   Primary colorectal adenocarcinoma ([place]) 04/2017   S/p total colectomy   Protein calorie malnutrition ([place])    Pyelonephritis    Renal failure (ARF), acute on chronic (HCC)    Renal insufficiency    chronic history   Sepsis ([place])    Smoking    Uremia    Urinary retention    UTI (lower urinary tract infection) 10/06/2011   Past Surgical History:  Procedure Laterality Date   A/V FISTULAGRAM N/A 08/13/2018   Procedure: A/V FISTULAGRAM - Right Upper;  Surgeon: Elam Dutch, MD;  Location: [facility];  Service: Cardiovascular;  Laterality: N/A;   A/V FISTULAGRAM N/A 11/22/2018   Procedure: A/V FISTULAGRAM - Right Upper;  Surgeon: Waynetta Sandy, MD;  Location: [facility];  Service: Cardiovascular;  Laterality: N/A;   A/V FISTULAGRAM Left 10/08/2022   Procedure: A/V Fistulagram;  Surgeon: Broadus John, MD;  Location: [facility];  Service: Cardiovascular;  Laterality: Left;   AV FISTULA PLACEMENT Left 07/06/2015   Procedure:  INSERTION LEFT ARM ARTERIOVENOUS GORTEX GRAFT;  Surgeon: Angelia Mould, MD;  Location: [facility];  Service: Vascular;  Laterality: Left;   AV FISTULA PLACEMENT Right 02/26/2016   Procedure: ARTERIOVENOUS (AV) FISTULA CREATION ;  Surgeon: Angelia Mould, MD;  Location: [facility];  Service: Vascular;  Laterality: Right;    AV FISTULA PLACEMENT Right 11/25/2018   Procedure: INSERTION OF ARTERIOVENOUS (AV) ARTEGRAFT RIGHT UPPER ARM;  Surgeon: Waynetta Sandy, MD;  Location: [facility];  Service: Vascular;  Laterality: Right;   AV FISTULA PLACEMENT Left 05/28/2021   Procedure: LEFT ARM ARTERIOVENOUS (AV) FISTULA;  Surgeon: Rosetta Posner, MD;  [facility];  Service: Vascular;  Laterality: Left;   [place] REMOVAL Left 10/09/2015   Procedure: REMOVAL OF ARTERIOVENOUS GORETEX GRAFT ([place]) Evacuation of Lymphocele, Vein Patch angioplasty of brachial artery.;  Surgeon: Angelia Mould, MD;  Location: [facility];  Service: Vascular;  Laterality: Left;  BASCILIC VEIN TRANSPOSITION Right 02/26/2016   Procedure: Right BASCILIC VEIN TRANSPOSITION;  Surgeon: Angelia Mould, MD;  Location: [facility];  Service: Vascular;  Laterality: Right;   [place] Left 07/30/2021   Procedure: LEFT ARM SECOND STAGE BASILIC VEIN TRANSPOSITION;  Surgeon: Rosetta Posner, MD;  [facility];  Service: Vascular;  Laterality: Left;   CIRCUMCISION N/A 01/04/2014   Procedure: CIRCUMCISION ADULT (procedure #1);  Surgeon: Marissa Nestle, MD;  [facility];  Service: Urology;  Laterality: N/A;   COLECTOMY N/A 05/04/2017   Procedure: TOTAL COLECTOMY;  Surgeon: Aviva Signs, MD;  [facility];  Service: General;  Laterality: N/A;   COLONOSCOPY N/A 04/27/2017   Surgeon: Daneil Dolin, MD; annular mass in the ascending colon likely representing cancer biopsied, multiple 6-22 mm polyps removed, clean rectum.  Pathology with multiple tubular adenomas, high-grade dysplasia noted in ascending colon and splenic flexure.   CYSTOSCOPY W/ RETROGRADES Bilateral 06/29/2015   Procedure: CYSTOSCOPY, DILATION OF URETHRAL STRICTURE WITH BILATERAL RETROGRADE PYELOGRAM,SUPRAPUBIC TUBE CHANGE;  Surgeon: Festus Aloe, MD;  [facility];  Service: Urology;  Laterality: Bilateral;   CYSTOSCOPY WITH URETHRAL DILATATION N/A  12/29/2013   Procedure: CYSTOSCOPY WITH URETHRAL DILATATION;  Surgeon: Marissa Nestle, MD;  [facility];  Service: Urology;  Laterality: N/A;   ESOPHAGOGASTRODUODENOSCOPY N/A 04/27/2017   Procedure: ESOPHAGOGASTRODUODENOSCOPY (EGD);  Surgeon: Daneil Dolin, MD;  [facility];  Service: Endoscopy;  Laterality: N/A;   FLEXIBLE SIGMOIDOSCOPY N/A 12/02/2021   Procedure: FLEXIBLE SIGMOIDOSCOPY;  Surgeon: Eloise Harman, DO;  [facility];  Service: Endoscopy;  Laterality: N/A;  10:30am, dialysis patient   INSERTION OF DIALYSIS CATHETER Right 11/25/2018   Procedure: INSERTION OF DIALYSIS CATHETER RIGHT INTERNAL JUGULAR;  Surgeon: Waynetta Sandy, MD;  Location: [facility];  Service: Vascular;  Laterality: Right;   IR AV DIALY SHUNT INTRO NEEDLE/INTRACATH INITIAL W/PTA/IMG RIGHT Right 09/07/2018   IR FLUORO GUIDE CV LINE RIGHT  10/16/2020   IR FLUORO GUIDE CV LINE RIGHT  10/03/2022   IR REMOVAL TUN CV CATH W/O FL  01/12/2019   IR THROMBECTOMY AV FISTULA W/THROMBOLYSIS/PTA INC/SHUNT/IMG RIGHT Right 04/26/2018   IR US GUIDE VASC ACCESS RIGHT  04/26/2018   IR US GUIDE VASC ACCESS RIGHT  09/07/2018   IR US GUIDE VASC ACCESS RIGHT  10/16/2020   IR US GUIDE VASC ACCESS RIGHT  10/16/2020   IR US GUIDE VASC ACCESS RIGHT  10/03/2022   ORIF FEMUR FRACTURE Right 11/22/2016   Procedure: OPEN REDUCTION INTERNAL FIXATION (ORIF) DISTAL FEMUR FRACTURE;  Surgeon: Rod Can, MD;  Location: [facility];  Service: Orthopedics;  Laterality: Right;   PATCH ANGIOPLASTY Right 12/10/2017   Procedure: PATCH ANGIOPLASTY;  Surgeon: Angelia Mould, MD;  Location: [facility];  Service: Vascular;  Laterality: Right;   PERIPHERAL VASCULAR BALLOON ANGIOPLASTY  08/13/2018   Procedure: PERIPHERAL VASCULAR BALLOON ANGIOPLASTY;  Surgeon: Elam Dutch, MD;  Location: [facility];  Service: Cardiovascular;;  right AV fistula    PERIPHERAL VASCULAR BALLOON ANGIOPLASTY  11/22/2018    Procedure: PERIPHERAL VASCULAR BALLOON ANGIOPLASTY;  Surgeon: Waynetta Sandy, MD;  Location: [facility];  Service: Cardiovascular;;  rt AV fistula   PERIPHERAL VASCULAR BALLOON ANGIOPLASTY  10/08/2022   Procedure: PERIPHERAL VASCULAR BALLOON ANGIOPLASTY;  Surgeon: Broadus John, MD;  Location: [facility];  Service: Cardiovascular;;  arterial anastamosis   PERIPHERAL VASCULAR CATHETERIZATION N/A 10/08/2015   Procedure: A/V Shuntogram;  Surgeon: Angelia Mould, MD;  Location: [facility];  Service: Cardiovascular;  Laterality: N/A;   PERIPHERAL VASCULAR INTERVENTION Left 10/08/2022   Procedure: PERIPHERAL VASCULAR INTERVENTION;  Surgeon: Broadus John, MD;  Location: [facility];  Service: Cardiovascular;  Laterality: Left;  left AVF   REMOVAL OF A DIALYSIS CATHETER N/A 12/03/2021   Procedure: MINOR REMOVAL OF A TUNNELED DIALYSIS CATHETER;  Surgeon: Rosetta Posner, MD;  [facility];  Service: Vascular;  Laterality: N/A;   REMOVAL OF A DIALYSIS CATHETER N/A 11/18/2022   Procedure: MINOR REMOVAL OF A TUNNELED DIALYSIS CATHETER;  Surgeon: Rosetta Posner, MD;  [facility];  Service: Vascular;  Laterality: N/A;   THROMBECTOMY W/ EMBOLECTOMY Right 12/10/2017   Procedure: THROMBECTOMY REVISION RIGHT ARM  ARTERIOVENOUS FISTULA;  Surgeon: Angelia Mould, MD;  Location: [facility];  Service: Vascular;  Laterality: Right;   TRANSURETHRAL RESECTION OF PROSTATE N/A 01/04/2014   Procedure: TRANSURETHRAL RESECTION OF THE PROSTATE (TURP) (procedure #2);  Surgeon: Marissa Nestle, MD;  [facility];  Service: Urology;  Laterality: N/A;   Family History  Problem Relation Age of Onset   Cancer Mother    Colon cancer Neg Hx    Social History   Socioeconomic History   Marital status: Single    Spouse name: Not on file   Number of children: Not on file   Years of education: Not on file   Highest education level: Not on file  Occupational  History   Occupation: retired   Tobacco Use   Smoking status: Never   Smokeless tobacco: Never  Vaping Use   Vaping Use: Never used  Substance and Sexual Activity   Alcohol use: No   Drug use: No   Sexual activity: Not Currently  Other Topics Concern   Not on file  Social History Narrative   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   [facility]    Social Determinants of Health   Financial Resource Strain: Low Risk  (02/18/2018)   Overall Financial Resource Strain (CARDIA)    Difficulty of Paying Living Expenses: Not hard at all  Food Insecurity: No Food Insecurity (02/18/2018)   Hunger Vital Sign    Worried About Running Out of Food in the Last Year: Never true    Ran Out of Food in the Last Year: Never true  Transportation Needs: No Transportation Needs (02/18/2018)   PRAPARE - Hydrologist (Medical): No    Lack of Transportation (Non-Medical): No  Physical Activity: Unknown (09/15/2019)   Exercise Vital Sign    Days of Exercise per Week: Not on file    Minutes of Exercise per Session: 0 min  Stress: No Stress Concern Present (02/18/2018)   [place]    Feeling of Stress : Not at all  Social Connections: Unknown (09/15/2019)   Social Connection and Isolation Panel [NHANES]    Frequency of Communication with Friends and Family: Not on file    Frequency of Social Gatherings with Friends and Family: Never    Attends Religious Services: Not on Advertising copywriter or Organizations: Not on file    Attends Archivist Meetings: Not on file    Marital Status: Not on file    Tobacco Counseling Counseling given: Not Answered   Clinical Intake:  Pre-visit preparation completed: Yes  Pain : No/denies  pain     BMI - recorded: 31.96 Nutritional Status: BMI > 30  Obese Nutritional Risks: Unintentional weight  gain Diabetes: Yes CBG done?: Yes CBG resulted in Enter/ Edit results?: Yes Did pt. bring in CBG monitor from home?: No  How often do you need to have someone help you when you read instructions, pamphlets, or other written materials from your doctor or pharmacy?: 5 - Always  Diabetic?yes  Interpreter Needed?: No      Activities of Daily Living    01/15/2023   11:10 AM  In your present state of health, do you have any difficulty performing the following activities:  Hearing? 0  Vision? 0  Difficulty concentrating or making decisions? 1  Walking or climbing stairs? 1  Dressing or bathing? 1  Doing errands, shopping? 1  Preparing Food and eating ? Y  Using the Toilet? Y  In the past six months, have you accidently leaked urine? N  Do you have problems with loss of bowel control? N  Managing your Medications? Y  Managing your Finances? Y  Housekeeping or managing your Housekeeping? Y    Patient Care Team: Gerlene Fee, NP as PCP - General (Geriatric Medicine) Fran Lowes, MD (Inactive) as Consulting Physician (Nephrology) [facility], [facility] Malva Cogan, Elon Alas, DO as Consulting Physician (Gastroenterology)  Indicate any recent Medical Services you may have received from other than [facility] providers in the past year (date may be approximate).     Assessment:   This is a routine wellness examination for [place].  Hearing/Vision screen No results found.  Dietary issues and exercise activities discussed: Current Exercise Habits: The patient does not participate in regular exercise at present, Exercise limited by: None identified   Goals Addressed             This Visit's Progress    Absence of Fall and Fall-Related Injury   On track    Evidence-based guidance:  Assess fall risk using a validated tool when available. Consider balance and gait impairment, muscle weakness, diminished vision or hearing,  environmental hazards, presence of urinary or bowel urgency and/or incontinence.  Communicate fall injury risk to interprofessional healthcare team.  Develop a fall prevention plan with the patient and family.  Promote use of personal vision and auditory aids.  Promote reorientation, appropriate sensory stimulation, and routines to decrease risk of fall when changes in mental status are present.  Assess assistance level required for safe and effective self-care; consider referral for home care.  Encourage physical activity, such as performance of self-care at highest level of ability, strength and balance exercise program, and provision of appropriate assistive devices; refer to rehabilitation therapy.  Refer to community-based fall prevention program where available.  If fall occurs, determine the cause and revise fall injury prevention plan.  Regularly review medication contribution to fall risk; consider risk related to polypharmacy and age.  Refer to pharmacist for consultation when concerns about medications are revealed.  Balance adequate pain management with potential for oversedation.  Provide guidance related to environmental modifications.  Consider supplementation with Vitamin D.   Notes:      Follow up with Provider as scheduled   On track    General - Client will not be readmitted within 30 days ([facility])   On track      Depression Screen    01/15/2023   11:10 AM 01/01/2023   10:45 AM 12/01/2022   12:13 PM 07/30/2022   11:28 AM  01/14/2022   11:30 AM 01/09/2022    1:24 PM 11/13/2021   10:33 AM  PHQ 2/9 Scores  PHQ - 2 Score 0 0 0 0 0 0 0  PHQ- 9 Score 0 0 0 0 0  0    Fall Risk    01/15/2023   11:10 AM 01/01/2023   10:44 AM 12/01/2022   10:23 AM 07/30/2022   11:28 AM 06/17/2022    9:47 AM  Fall Risk   Falls in the past year? 0 0 0 0 0  Number falls in past yr: 0 0 0 0   Injury with Fall? 0 0 0 0   Risk for fall due to : Impaired mobility;Impaired balance/gait No Fall Risks No  Fall Risks Impaired balance/gait;Impaired mobility History of fall(s);Impaired balance/gait;Impaired mobility  Follow up  Falls evaluation completed Falls evaluation completed  Falls evaluation completed    FALL RISK PREVENTION PERTAINING TO THE HOME:  Any stairs in or around the home? Yes  If so, are there any without handrails? No  Home free of loose throw rugs in walkways, pet beds, electrical cords, etc? Yes  Adequate lighting in your home to reduce risk of falls? Yes   ASSISTIVE DEVICES UTILIZED TO PREVENT FALLS:  Life alert? No  Use of a cane, walker or w/c? Yes  Grab bars in the bathroom? Yes  Shower chair or bench in shower? Yes  Elevated toilet seat or a handicapped toilet? Yes   TIMED UP AND GO:  Was the test performed? No .  Does not ambulate   Cognitive Function:    01/09/2022    1:25 PM 01/08/2021    1:49 PM  MMSE - Mini Mental State Exam  Not completed: Unable to complete Refused        01/08/2021    1:46 PM 02/18/2018   10:48 AM  6CIT Screen  What Year? 0 points 4 points  What month? 0 points 3 points  What time? 0 points 3 points  Count back from 20 2 points 4 points  Months in reverse 4 points 4 points  Repeat phrase 4 points 10 points  Total Score 10 points 28 points    Immunizations Immunization History  Administered Date(s) Administered   Hepatitis B, ADULT 09/30/2016, 10/28/2016, 12/02/2016, 03/28/2017, 07/02/2017, 08/13/2017, 09/12/2017, 01/14/2018   Influenza,inj,Quad PF,6+ Mos 12/26/2013, 07/09/2015, 07/31/2021   Influenza,inj,Quad PF,6-35 Mos 08/08/2019   Influenza-Unspecified 07/28/2016, 07/31/2017, 07/29/2018, 08/08/2019, 07/20/2020, 08/03/2020, 07/29/2022   Moderna Covid-19 Vaccine Bivalent Booster 74yrs & up 08/20/2021   Moderna SARS-COV2 Booster Vaccination 02/06/2021   Moderna Sars-Covid-2 Vaccination 11/02/2019, 11/30/2019, 08/30/2020, 08/20/2022   PNEUMOCOCCAL CONJUGATE-20 08/02/2021   PPD Test 03/07/2015, 08/08/2016, 05/15/2017,  08/31/2018, 10/10/2019, 10/10/2020, 09/12/2021   Pneumococcal Conjugate-13 12/03/2016   Pneumococcal Polysaccharide-23 03/04/2015, 07/31/2017   Pneumococcal-Unspecified 06/12/2015   RSV,unspecified 09/29/2022   Tdap 08/22/2017   Unspecified SARS-COV-2 Vaccination 11/02/2019   Zoster Recombinat (Shingrix) 05/30/2015, 09/27/2021    TDAP status: Up to date  Flu Vaccine status: Up to date  Pneumococcal vaccine status: Up to date  Covid-19 vaccine status: Completed vaccines  Qualifies for Shingles Vaccine? Yes   Zostavax completed Yes   Shingrix Completed?: No.    Education has been provided regarding the importance of this vaccine. Patient has been advised to call insurance company to determine out of pocket expense if they have not yet received this vaccine. Advised may also receive vaccine at local pharmacy or Health Dept. Verbalized acceptance and understanding.  Screening Tests Health Maintenance  Topic Date Due   Medicare Annual Wellness (AWV)  01/10/2023   OPHTHALMOLOGY EXAM  08/06/2023 (Originally 10/02/2022)   COVID-19 Vaccine (7 - 2023-24 season) 08/18/2023 (Originally 10/15/2022)   HEMOGLOBIN A1C  03/31/2023   FOOT EXAM  12/02/2023   COLONOSCOPY (Pts 45-30yrs Insurance coverage will need to be confirmed)  04/28/2027   DTaP/Tdap/Td (2 - Td or Tdap) 08/23/2027   Pneumonia Vaccine 53+ Years old  Completed   INFLUENZA VACCINE  Completed   Hepatitis C Screening  Completed   Zoster Vaccines- Shingrix  Completed   HPV VACCINES  Aged Out    Health Maintenance  Health Maintenance Due  Topic Date Due   Medicare Annual Wellness (AWV)  01/10/2023    Colorectal cancer screening: Type of screening: Sigmoidoscopy. Completed 12-02-21. Repeat every 5 years  Lung Cancer Screening: (Low Dose CT Chest recommended if Age 6-80 years, 30 pack-year currently smoking OR have quit w/in 15years.) does not qualify.   Lung Cancer Screening Referral: no  Additional Screening:  Hepatitis C  Screening: does not qualify; Completed 08-11-16  Vision Screening: Recommended annual ophthalmology exams for early detection of glaucoma and other disorders of the eye. Is the patient up to date with their annual eye exam?  No  Who is the provider or what is the name of the office in which the patient attends annual eye exams?  If pt is not established with a provider, would they like to be referred to a provider to establish care? No .   Dental Screening: Recommended annual dental exams for proper oral hygiene  Community Resource Referral / Chronic Care Management: CRR required this visit?  No   CCM required this visit?  No      Plan:     I have personally reviewed and noted the following in the patient's chart:   Medical and social history Use of alcohol, tobacco or illicit drugs  Current medications and supplements including opioid prescriptions. Patient is not currently taking opioid prescriptions. Functional ability and status Nutritional status Physical activity Advanced directives List of other physicians Hospitalizations, surgeries, and ER visits in previous 12 months Vitals Screenings to include cognitive, depression, and falls Referrals and appointments  In addition, I have reviewed and discussed with patient certain preventive protocols, quality metrics, and best practice recommendations. A written personalized care plan for preventive services as well as general preventive health recommendations were provided to patient.     Gerlene Fee, NP   01/15/2023   Nurse Notes: this exam was performed by myself at this facility

## 2023-01-15 NOTE — Patient Instructions (Signed)
  [name] , Thank you for taking time to come for your Medicare Wellness Visit. I appreciate your ongoing commitment to your health goals. Please review the following plan we discussed and let me know if I can assist you in the future.   These are the goals we discussed:  Goals      Absence of Fall and Fall-Related Injury     Evidence-based guidance:  Assess fall risk using a validated tool when available. Consider balance and gait impairment, muscle weakness, diminished vision or hearing, environmental hazards, presence of urinary or bowel urgency and/or incontinence.  Communicate fall injury risk to interprofessional healthcare team.  Develop a fall prevention plan with the patient and family.  Promote use of personal vision and auditory aids.  Promote reorientation, appropriate sensory stimulation, and routines to decrease risk of fall when changes in mental status are present.  Assess assistance level required for safe and effective self-care; consider referral for home care.  Encourage physical activity, such as performance of self-care at highest level of ability, strength and balance exercise program, and provision of appropriate assistive devices; refer to rehabilitation therapy.  Refer to community-based fall prevention program where available.  If fall occurs, determine the cause and revise fall injury prevention plan.  Regularly review medication contribution to fall risk; consider risk related to polypharmacy and age.  Refer to pharmacist for consultation when concerns about medications are revealed.  Balance adequate pain management with potential for oversedation.  Provide guidance related to environmental modifications.  Consider supplementation with Vitamin D.   Notes:      Follow up with Provider as scheduled     General - Client will not be readmitted within 30 days (C-[facility])        This is a list of the screening recommended for you and due dates:  Health Maintenance   Topic Date Due   Medicare Annual Wellness Visit  01/10/2023   Eye exam for diabetics  08/06/2023*   COVID-19 Vaccine (7 - 2023-24 season) 08/18/2023*   Hemoglobin A1C  03/31/2023   Complete foot exam   12/02/2023   Colon Cancer Screening  04/28/2027   DTaP/Tdap/Td vaccine (2 - Td or Tdap) 08/23/2027   Pneumonia Vaccine  Completed   Flu Shot  Completed   Hepatitis C Screening: USPSTF Recommendation to screen - Ages 42-79 yo.  Completed   Zoster (Shingles) Vaccine  Completed   HPV Vaccine  Aged Out  *Topic was postponed. The date shown is not the original due date.

## 2023-01-17 DIAGNOSIS — N186 End stage renal disease: Secondary | ICD-10-CM | POA: Diagnosis not present

## 2023-01-17 DIAGNOSIS — N2581 Secondary hyperparathyroidism of renal origin: Secondary | ICD-10-CM | POA: Diagnosis not present

## 2023-01-17 DIAGNOSIS — D509 Iron deficiency anemia, unspecified: Secondary | ICD-10-CM | POA: Diagnosis not present

## 2023-01-17 DIAGNOSIS — N250 Renal osteodystrophy: Secondary | ICD-10-CM | POA: Diagnosis not present

## 2023-01-17 DIAGNOSIS — Z992 Dependence on renal dialysis: Secondary | ICD-10-CM | POA: Diagnosis not present

## 2023-01-17 DIAGNOSIS — D631 Anemia in chronic kidney disease: Secondary | ICD-10-CM | POA: Diagnosis not present

## 2023-01-20 DIAGNOSIS — D509 Iron deficiency anemia, unspecified: Secondary | ICD-10-CM | POA: Diagnosis not present

## 2023-01-20 DIAGNOSIS — N250 Renal osteodystrophy: Secondary | ICD-10-CM | POA: Diagnosis not present

## 2023-01-20 DIAGNOSIS — N186 End stage renal disease: Secondary | ICD-10-CM | POA: Diagnosis not present

## 2023-01-20 DIAGNOSIS — Z992 Dependence on renal dialysis: Secondary | ICD-10-CM | POA: Diagnosis not present

## 2023-01-20 DIAGNOSIS — D631 Anemia in chronic kidney disease: Secondary | ICD-10-CM | POA: Diagnosis not present

## 2023-01-20 DIAGNOSIS — N2581 Secondary hyperparathyroidism of renal origin: Secondary | ICD-10-CM | POA: Diagnosis not present

## 2023-01-22 DIAGNOSIS — D509 Iron deficiency anemia, unspecified: Secondary | ICD-10-CM | POA: Diagnosis not present

## 2023-01-22 DIAGNOSIS — D631 Anemia in chronic kidney disease: Secondary | ICD-10-CM | POA: Diagnosis not present

## 2023-01-22 DIAGNOSIS — N186 End stage renal disease: Secondary | ICD-10-CM | POA: Diagnosis not present

## 2023-01-22 DIAGNOSIS — N2581 Secondary hyperparathyroidism of renal origin: Secondary | ICD-10-CM | POA: Diagnosis not present

## 2023-01-22 DIAGNOSIS — Z992 Dependence on renal dialysis: Secondary | ICD-10-CM | POA: Diagnosis not present

## 2023-01-22 DIAGNOSIS — N250 Renal osteodystrophy: Secondary | ICD-10-CM | POA: Diagnosis not present

## 2023-01-24 DIAGNOSIS — N186 End stage renal disease: Secondary | ICD-10-CM | POA: Diagnosis not present

## 2023-01-24 DIAGNOSIS — N2581 Secondary hyperparathyroidism of renal origin: Secondary | ICD-10-CM | POA: Diagnosis not present

## 2023-01-24 DIAGNOSIS — N250 Renal osteodystrophy: Secondary | ICD-10-CM | POA: Diagnosis not present

## 2023-01-24 DIAGNOSIS — Z992 Dependence on renal dialysis: Secondary | ICD-10-CM | POA: Diagnosis not present

## 2023-01-24 DIAGNOSIS — D631 Anemia in chronic kidney disease: Secondary | ICD-10-CM | POA: Diagnosis not present

## 2023-01-24 DIAGNOSIS — D509 Iron deficiency anemia, unspecified: Secondary | ICD-10-CM | POA: Diagnosis not present

## 2023-01-25 DIAGNOSIS — Z992 Dependence on renal dialysis: Secondary | ICD-10-CM | POA: Diagnosis not present

## 2023-01-25 DIAGNOSIS — N186 End stage renal disease: Secondary | ICD-10-CM | POA: Diagnosis not present

## 2023-01-27 DIAGNOSIS — D509 Iron deficiency anemia, unspecified: Secondary | ICD-10-CM | POA: Diagnosis not present

## 2023-01-27 DIAGNOSIS — K922 Gastrointestinal hemorrhage, unspecified: Secondary | ICD-10-CM | POA: Diagnosis not present

## 2023-01-27 DIAGNOSIS — N2581 Secondary hyperparathyroidism of renal origin: Secondary | ICD-10-CM | POA: Diagnosis not present

## 2023-01-27 DIAGNOSIS — Z992 Dependence on renal dialysis: Secondary | ICD-10-CM | POA: Diagnosis not present

## 2023-01-27 DIAGNOSIS — N186 End stage renal disease: Secondary | ICD-10-CM | POA: Diagnosis not present

## 2023-01-27 DIAGNOSIS — D631 Anemia in chronic kidney disease: Secondary | ICD-10-CM | POA: Diagnosis not present

## 2023-01-27 DIAGNOSIS — K920 Hematemesis: Secondary | ICD-10-CM | POA: Diagnosis not present

## 2023-01-27 DIAGNOSIS — N250 Renal osteodystrophy: Secondary | ICD-10-CM | POA: Diagnosis not present

## 2023-01-29 DIAGNOSIS — D509 Iron deficiency anemia, unspecified: Secondary | ICD-10-CM | POA: Diagnosis not present

## 2023-01-29 DIAGNOSIS — Z992 Dependence on renal dialysis: Secondary | ICD-10-CM | POA: Diagnosis not present

## 2023-01-29 DIAGNOSIS — D631 Anemia in chronic kidney disease: Secondary | ICD-10-CM | POA: Diagnosis not present

## 2023-01-29 DIAGNOSIS — N186 End stage renal disease: Secondary | ICD-10-CM | POA: Diagnosis not present

## 2023-01-29 DIAGNOSIS — N2581 Secondary hyperparathyroidism of renal origin: Secondary | ICD-10-CM | POA: Diagnosis not present

## 2023-01-29 DIAGNOSIS — N250 Renal osteodystrophy: Secondary | ICD-10-CM | POA: Diagnosis not present

## 2023-01-31 DIAGNOSIS — D631 Anemia in chronic kidney disease: Secondary | ICD-10-CM | POA: Diagnosis not present

## 2023-01-31 DIAGNOSIS — N2581 Secondary hyperparathyroidism of renal origin: Secondary | ICD-10-CM | POA: Diagnosis not present

## 2023-01-31 DIAGNOSIS — N186 End stage renal disease: Secondary | ICD-10-CM | POA: Diagnosis not present

## 2023-01-31 DIAGNOSIS — N250 Renal osteodystrophy: Secondary | ICD-10-CM | POA: Diagnosis not present

## 2023-01-31 DIAGNOSIS — Z992 Dependence on renal dialysis: Secondary | ICD-10-CM | POA: Diagnosis not present

## 2023-01-31 DIAGNOSIS — D509 Iron deficiency anemia, unspecified: Secondary | ICD-10-CM | POA: Diagnosis not present

## 2023-02-03 DIAGNOSIS — Z992 Dependence on renal dialysis: Secondary | ICD-10-CM | POA: Diagnosis not present

## 2023-02-03 DIAGNOSIS — D509 Iron deficiency anemia, unspecified: Secondary | ICD-10-CM | POA: Diagnosis not present

## 2023-02-03 DIAGNOSIS — N186 End stage renal disease: Secondary | ICD-10-CM | POA: Diagnosis not present

## 2023-02-03 DIAGNOSIS — N2581 Secondary hyperparathyroidism of renal origin: Secondary | ICD-10-CM | POA: Diagnosis not present

## 2023-02-03 DIAGNOSIS — N250 Renal osteodystrophy: Secondary | ICD-10-CM | POA: Diagnosis not present

## 2023-02-03 DIAGNOSIS — D631 Anemia in chronic kidney disease: Secondary | ICD-10-CM | POA: Diagnosis not present

## 2023-02-05 DIAGNOSIS — N250 Renal osteodystrophy: Secondary | ICD-10-CM | POA: Diagnosis not present

## 2023-02-05 DIAGNOSIS — D509 Iron deficiency anemia, unspecified: Secondary | ICD-10-CM | POA: Diagnosis not present

## 2023-02-05 DIAGNOSIS — Z992 Dependence on renal dialysis: Secondary | ICD-10-CM | POA: Diagnosis not present

## 2023-02-05 DIAGNOSIS — N186 End stage renal disease: Secondary | ICD-10-CM | POA: Diagnosis not present

## 2023-02-05 DIAGNOSIS — D631 Anemia in chronic kidney disease: Secondary | ICD-10-CM | POA: Diagnosis not present

## 2023-02-05 DIAGNOSIS — N2581 Secondary hyperparathyroidism of renal origin: Secondary | ICD-10-CM | POA: Diagnosis not present

## 2023-02-07 DIAGNOSIS — N186 End stage renal disease: Secondary | ICD-10-CM | POA: Diagnosis not present

## 2023-02-07 DIAGNOSIS — D509 Iron deficiency anemia, unspecified: Secondary | ICD-10-CM | POA: Diagnosis not present

## 2023-02-07 DIAGNOSIS — Z992 Dependence on renal dialysis: Secondary | ICD-10-CM | POA: Diagnosis not present

## 2023-02-07 DIAGNOSIS — N2581 Secondary hyperparathyroidism of renal origin: Secondary | ICD-10-CM | POA: Diagnosis not present

## 2023-02-07 DIAGNOSIS — N250 Renal osteodystrophy: Secondary | ICD-10-CM | POA: Diagnosis not present

## 2023-02-07 DIAGNOSIS — D631 Anemia in chronic kidney disease: Secondary | ICD-10-CM | POA: Diagnosis not present

## 2023-02-09 ENCOUNTER — Encounter: Payer: Self-pay | Admitting: Internal Medicine

## 2023-02-09 ENCOUNTER — Non-Acute Institutional Stay (SKILLED_NURSING_FACILITY): Payer: Medicare Other | Admitting: Internal Medicine

## 2023-02-09 DIAGNOSIS — Z992 Dependence on renal dialysis: Secondary | ICD-10-CM

## 2023-02-09 DIAGNOSIS — E1122 Type 2 diabetes mellitus with diabetic chronic kidney disease: Secondary | ICD-10-CM | POA: Diagnosis not present

## 2023-02-09 DIAGNOSIS — R059 Cough, unspecified: Secondary | ICD-10-CM

## 2023-02-09 DIAGNOSIS — D508 Other iron deficiency anemias: Secondary | ICD-10-CM

## 2023-02-09 DIAGNOSIS — N186 End stage renal disease: Secondary | ICD-10-CM | POA: Diagnosis not present

## 2023-02-09 DIAGNOSIS — I120 Hypertensive chronic kidney disease with stage 5 chronic kidney disease or end stage renal disease: Secondary | ICD-10-CM | POA: Diagnosis not present

## 2023-02-09 NOTE — Assessment & Plan Note (Addendum)
Most recent A1c was nondiabetic at 5% on 10/08/2022.  Update with next blood draw at SNF. In the context of his ESRD on HD; there may be discordance between random glucoses and the A1c.

## 2023-02-09 NOTE — Patient Instructions (Signed)
See assessment and plan under each diagnosis in the problem list and acutely for this visit 

## 2023-02-09 NOTE — Progress Notes (Signed)
NURSING HOME LOCATION:  [facility] ROOM NUMBER:  [id number]  CODE STATUS:  Full Code  PCP:  Synthia Innocent NP  This is a nursing facility follow up visit of chronic medical diagnoses & to document compliance with Regulation 483.30 (c) in The Long Term Care Survey Manual Phase 2 which mandates caregiver visit ( visits can alternate among physician, PA or NP as per statutes) within 10 days of 30 days / 60 days/ 90 days post admission to SNF date    Interim medical record and care since last SNF visit was updated with review of diagnostic studies and change in clinical status since last visit were documented.  HPI: He is a permanent resident of this facility with medical diagnoses of diabetes with ESRD, history of DVT, GERD, dyslipidemia, essential hypertension, hypothyroidism, and history of primary colorectal adenocarcinoma.He has end-stage renal disease and on dialysis Tuesday, Thursday, and Saturday. He has had multiple vascular procedures related to hemodialysis access. Colectomy was performed in 2019. Labs are performed at hemodialysis Tuesday, Thursday, and Saturday each week.  The most recent chemistries in Epic were 10/08/2022 revealing a creatinine of 12.30; GFR was 2.  Surprisingly at that time H/H was 13.8/40.  A1c was 5.0%, down from a prior value of 5.8%.  Review of systems:Staff reports that he has had a productive cough. He validates that he was coughing but this has resolved.  He also stated that he "throwed up" apparently yesterday morning.  He denies any specific infectious or extrinsic symptoms at this time.  Constitutional: No fever, significant weight change, fatigue  Eyes: No redness, discharge, pain, vision change ENT/mouth: No nasal congestion,  purulent discharge, earache, change in hearing, sore throat  Cardiovascular: No chest pain, palpitations, paroxysmal nocturnal dyspnea, claudication, edema  Respiratory: No hemoptysis, DOE, significant snoring,  apnea   Gastrointestinal: No heartburn, dysphagia, abdominal pain, rectal bleeding, melena, change in bowels Genitourinary: No dysuria, hematuria, pyuria, incontinence, nocturia Musculoskeletal: No joint stiffness, joint swelling, weakness, pain Dermatologic: No rash, pruritus, change in appearance of skin Neurologic: No dizziness, headache, syncope, seizures, numbness, tingling Psychiatric: No significant anxiety, depression, insomnia, anorexia Endocrine: No change in hair/skin/nails, excessive thirst, excessive hunger, excessive urination  Hematologic/lymphatic: No significant bruising, lymphadenopathy, abnormal bleeding Allergy/immunology: No itchy/watery eyes, significant sneezing, urticaria, angioedema  Physical exam:  Pertinent or positive findings: When initially seen he was in a wheelchair in the hallway.  He gave me his room number as "12."  He does exhibit some dysarthria and it is difficult to discern his verbage. He has slight exotropia on the right.  Clear rhinitis is noted emanating from the left nare.  Dentition is poor with malalignment, plaque formation, and multiple missing teeth especially of the maxilla.  Breath sounds are decreased.  There are insignificant rales of the right lower lobe posteriorly.  Resting tachycardia is present with a flow murmur.  Abdomen is protuberant.  Bowel sounds are decreased; there is no evidence of ileus.  He has trace edema at the sock line.  Pedal pulses are decreased to palpation.  Strength to opposition is fair and essentially symmetric in all extremities.  General appearance: Adequately nourished; no acute distress, increased work of breathing is present.   Lymphatic: No lymphadenopathy about the head, neck, axilla. Eyes: No conjunctival inflammation or lid edema is present. There is no scleral icterus. Ears:  External ear exam shows no significant lesions or deformities.   Nose:  External nasal examination shows no deformity or inflammation.  Nasal mucosa are pink and moist without lesions, exudates Neck:  No thyromegaly, masses, tenderness noted.    Heart:  No gallop, click, rub .  Lungs:  without wheezes, rhonchi, rubs. Abdomen: Abdomen is soft and nontender with no organomegaly, hernias, masses. GU: Deferred  Extremities:  No cyanosis, clubbing  Neurologic exam :Balance, Rhomberg, finger to nose testing could not be completed due to clinical state Skin: Warm & dry w/o tenting. No significant lesions or rash.  See summary under each active problem in the Problem List with associated updated therapeutic plan

## 2023-02-09 NOTE — Assessment & Plan Note (Signed)
Zachary [name] notified of negative C-19 screen; copy of lab reports requested.

## 2023-02-09 NOTE — Assessment & Plan Note (Addendum)
CBC will be performed at HD tomorrow.  Copy of labs requested.

## 2023-02-10 DIAGNOSIS — D631 Anemia in chronic kidney disease: Secondary | ICD-10-CM | POA: Diagnosis not present

## 2023-02-10 DIAGNOSIS — N2581 Secondary hyperparathyroidism of renal origin: Secondary | ICD-10-CM | POA: Diagnosis not present

## 2023-02-10 DIAGNOSIS — N186 End stage renal disease: Secondary | ICD-10-CM | POA: Diagnosis not present

## 2023-02-10 DIAGNOSIS — D509 Iron deficiency anemia, unspecified: Secondary | ICD-10-CM | POA: Diagnosis not present

## 2023-02-10 DIAGNOSIS — Z992 Dependence on renal dialysis: Secondary | ICD-10-CM | POA: Diagnosis not present

## 2023-02-10 DIAGNOSIS — N250 Renal osteodystrophy: Secondary | ICD-10-CM | POA: Diagnosis not present

## 2023-02-12 ENCOUNTER — Encounter: Payer: Self-pay | Admitting: Internal Medicine

## 2023-02-12 ENCOUNTER — Non-Acute Institutional Stay (SKILLED_NURSING_FACILITY): Payer: Medicare Other | Admitting: Internal Medicine

## 2023-02-12 DIAGNOSIS — K219 Gastro-esophageal reflux disease without esophagitis: Secondary | ICD-10-CM

## 2023-02-12 DIAGNOSIS — D631 Anemia in chronic kidney disease: Secondary | ICD-10-CM

## 2023-02-12 DIAGNOSIS — N186 End stage renal disease: Secondary | ICD-10-CM

## 2023-02-12 DIAGNOSIS — N2581 Secondary hyperparathyroidism of renal origin: Secondary | ICD-10-CM | POA: Diagnosis not present

## 2023-02-12 DIAGNOSIS — Z992 Dependence on renal dialysis: Secondary | ICD-10-CM | POA: Diagnosis not present

## 2023-02-12 DIAGNOSIS — N250 Renal osteodystrophy: Secondary | ICD-10-CM | POA: Diagnosis not present

## 2023-02-12 DIAGNOSIS — E1122 Type 2 diabetes mellitus with diabetic chronic kidney disease: Secondary | ICD-10-CM

## 2023-02-12 DIAGNOSIS — D509 Iron deficiency anemia, unspecified: Secondary | ICD-10-CM | POA: Diagnosis not present

## 2023-02-12 NOTE — Assessment & Plan Note (Signed)
CBC and differential will be collected as I do not see copies of such in the [facility] lab reports we have received.

## 2023-02-12 NOTE — Patient Instructions (Signed)
See assessment and plan under each diagnosis in the problem list and acutely for this visit 

## 2023-02-12 NOTE — Assessment & Plan Note (Addendum)
He now describes nausea and vomiting apparently after going to bed in the evening.  There is a question of some hematemesis.  He denies dyspepsia, abdominal pain, or dysphagia.  By history he had a EGD but I cannot that record. CMET, CBC and differential, lipase, and FOB will be ordered.  He will continue his PPI and Zofran as needed.  GI consult will be requested.

## 2023-02-12 NOTE — Progress Notes (Addendum)
NURSING HOME LOCATION:  [facility] ROOM NUMBER:    CODE STATUS:  Full Code  PCP:  Synthia Innocent NP  This is a nursing facility follow up visit for specific acute issue of nausea and vomiting.  Interim medical record and care since last SNF visit was updated with review of diagnostic studies and change in clinical status since last visit were documented.  HPI: He had hemodialysis at [facility] today; it was reported by HD staff that he had been complaining of vomiting with meals for 2 weeks.  Supposedly there is also visualized blood with vomitus.  Labs at [facility] apparently had revealed stable hemoglobin but they have documented weight loss. Significant past medical history includes colectomy in 2018 for colorectal cancer.  Dr. Jena Gauss ,GI performed colonoscopy prior to the colectomy.  Diverticulosis was also noted in addition to multiple polyps.  He has a history of GERD and is on omeprazole 40 mg daily as well as Zofran as needed. Labs are performed at hemodialysis 3 times a week; the last lab reports in Epic were from December 2023.  Most recent labs from HD in [facility] were 01/06/2023 with an albumin of 3.7, potassium 4.1, calcium 9.1, phosphorus 4.1, PTH 162, and glucose of 82.  There were no H/H values included.  Review of systems is difficult to complete as his speech is garbled and almost indiscernible.  He also tends to appear to contradict himself when clarification is pursued.  He does seem to indicate that he has had nausea and vomiting for approximately 2 weeks but not with meals.  He states that after he goes to bed he will have nausea and vomiting.  He indicates he may have seen some blood in the vomitus.  He denies dyspepsia or dysphagia.  He also denies abdominal pain.  Physical exam:  Pertinent or positive findings: Slight OD  exotropia is present.  Arcus senilis is present.  There is no scleral icterus.  He is missing multiple teeth and the remaining  teeth are malaligned.  He has a grade 1/2 systolic murmur with increased second heart sound.  He has low-grade rhonchi at the bases.  Abdomen is nontender.  Pedal pulses are decreased.  Tissue turgor is normal without tenting.  General appearance: Adequately nourished; no acute distress, increased work of breathing is present.   Lymphatic: No lymphadenopathy about the head, neck, axilla. Eyes: No conjunctival inflammation or lid edema is present. There is no scleral icterus. Ears:  External ear exam shows no significant lesions or deformities.   Nose:  External nasal examination shows no deformity or inflammation. Nasal mucosa are pink and moist without lesions, exudates Neck:  No thyromegaly, masses, tenderness noted.    Heart:  Normal rate and regular rhythm. S1 normal without gallop,  click, rub .  Lungs:  without wheezes,  rales, rubs. Abdomen: Bowel sounds are normal. Abdomen is soft  with no organomegaly, hernias, masses. GU: Deferred  Extremities:  No cyanosis, clubbing, edema  Skin: Warm & dry. No significant lesions or rash.  See summary under each active problem in the Problem List with associated updated therapeutic plan.  After completion of my note I received copies of his labs performed 02/13/2023 at 8 AM.  Potassium was 2.9, creatinine 6.28, and GFR 9.  Macrocytic anemia was present with H/H of 9.7/28.7 and MCV of 101.1.  Platelet count was 126,000.  Lipase was 59. The plan had been to pursue the GI evaluation of the  nausea , vomiting, & hematemesis as soon as possible; but the critical hypokalemia necessitates referral to the ED for replacement. [facility] was notified of these labs and a copy of this report will be sent to Royann Shivers, RN and Dr. Thedore Mins. ED triage will be contacted for continuity of care.Imaging of the pancreas can be performed while in the ED.

## 2023-02-12 NOTE — Assessment & Plan Note (Addendum)
Most recent lab reports performed at HD & filed in [facility] reviewed.  No CBC included.

## 2023-02-13 ENCOUNTER — Other Ambulatory Visit: Payer: Self-pay

## 2023-02-13 ENCOUNTER — Other Ambulatory Visit (HOSPITAL_COMMUNITY)
Admission: RE | Admit: 2023-02-13 | Discharge: 2023-02-13 | Disposition: A | Discharge status: Home / Self Care | Payer: Medicare Other | Source: Skilled Nursing Facility | Attending: *Deleted | Admitting: *Deleted

## 2023-02-13 ENCOUNTER — Emergency Department (HOSPITAL_COMMUNITY)
Admission: EM | Admit: 2023-02-13 | Discharge: 2023-02-13 | Disposition: A | Discharge status: Skilled Nursing Facility | Payer: Medicare Other | Attending: Emergency Medicine | Admitting: Emergency Medicine

## 2023-02-13 ENCOUNTER — Encounter (HOSPITAL_COMMUNITY): Payer: Self-pay | Admitting: Emergency Medicine

## 2023-02-13 DIAGNOSIS — Z85038 Personal history of other malignant neoplasm of large intestine: Secondary | ICD-10-CM | POA: Diagnosis not present

## 2023-02-13 DIAGNOSIS — N186 End stage renal disease: Secondary | ICD-10-CM | POA: Insufficient documentation

## 2023-02-13 DIAGNOSIS — E876 Hypokalemia: Secondary | ICD-10-CM | POA: Insufficient documentation

## 2023-02-13 DIAGNOSIS — I120 Hypertensive chronic kidney disease with stage 5 chronic kidney disease or end stage renal disease: Secondary | ICD-10-CM | POA: Insufficient documentation

## 2023-02-13 DIAGNOSIS — Z7982 Long term (current) use of aspirin: Secondary | ICD-10-CM | POA: Diagnosis not present

## 2023-02-13 DIAGNOSIS — R799 Abnormal finding of blood chemistry, unspecified: Secondary | ICD-10-CM | POA: Diagnosis present

## 2023-02-13 DIAGNOSIS — N189 Chronic kidney disease, unspecified: Secondary | ICD-10-CM | POA: Insufficient documentation

## 2023-02-13 DIAGNOSIS — Z794 Long term (current) use of insulin: Secondary | ICD-10-CM | POA: Diagnosis not present

## 2023-02-13 DIAGNOSIS — Z992 Dependence on renal dialysis: Secondary | ICD-10-CM | POA: Diagnosis not present

## 2023-02-13 DIAGNOSIS — R112 Nausea with vomiting, unspecified: Secondary | ICD-10-CM | POA: Diagnosis not present

## 2023-02-13 LAB — CBC
HCT: 28.7 % — ABNORMAL LOW (ref 39.0–52.0)
Hemoglobin: 9.7 g/dL — ABNORMAL LOW (ref 13.0–17.0)
MCH: 34.2 pg — ABNORMAL HIGH (ref 26.0–34.0)
MCHC: 33.8 g/dL (ref 30.0–36.0)
MCV: 101.1 fL — ABNORMAL HIGH (ref 80.0–100.0)
Platelets: 126 10*3/uL — ABNORMAL LOW (ref 150–400)
RBC: 2.84 MIL/uL — ABNORMAL LOW (ref 4.22–5.81)
RDW: 13.2 % (ref 11.5–15.5)
WBC: 3.8 10*3/uL — ABNORMAL LOW (ref 4.0–10.5)
nRBC: 0 % (ref 0.0–0.2)

## 2023-02-13 LAB — COMPREHENSIVE METABOLIC PANEL
ALT: 19 U/L (ref 0–44)
AST: 28 U/L (ref 15–41)
Albumin: 3.2 g/dL — ABNORMAL LOW (ref 3.5–5.0)
Alkaline Phosphatase: 50 U/L (ref 38–126)
Anion gap: 9 (ref 5–15)
BUN: 18 mg/dL (ref 8–23)
CO2: 28 mmol/L (ref 22–32)
Calcium: 8.8 mg/dL — ABNORMAL LOW (ref 8.9–10.3)
Chloride: 100 mmol/L (ref 98–111)
Creatinine, Ser: 6.28 mg/dL — ABNORMAL HIGH (ref 0.61–1.24)
GFR, Estimated: 9 mL/min — ABNORMAL LOW (ref >60)
Glucose, Bld: 67 mg/dL — ABNORMAL LOW (ref 70–99)
Potassium: 2.9 mmol/L — ABNORMAL LOW (ref 3.5–5.1)
Sodium: 137 mmol/L (ref 135–145)
Total Bilirubin: 0.6 mg/dL (ref 0.3–1.2)
Total Protein: 6.9 g/dL (ref 6.5–8.1)

## 2023-02-13 LAB — POC OCCULT BLOOD, ED: Fecal Occult Bld: NEGATIVE

## 2023-02-13 LAB — MAGNESIUM: Magnesium: 1.8 mg/dL (ref 1.7–2.4)

## 2023-02-13 LAB — TYPE AND SCREEN
ABO/RH(D): B POS
Antibody Screen: NEGATIVE

## 2023-02-13 LAB — LIPASE, BLOOD: Lipase: 59 U/L — ABNORMAL HIGH (ref 11–51)

## 2023-02-13 MED ORDER — POTASSIUM CHLORIDE CRYS ER 20 MEQ PO TBCR
40.0000 meq | EXTENDED_RELEASE_TABLET | Freq: Once | ORAL | Status: AC
  Administered 2023-02-13: 40 meq via ORAL
  Filled 2023-02-13: qty 2

## 2023-02-13 NOTE — Discharge Instructions (Signed)
You were sent in by your doctor for low potassium.  You were given potassium.  Please have repeat lab work done either by your primary care doctor or your dialysis center.  Return to the emergency department if any worsening or concerning symptoms

## 2023-02-13 NOTE — Addendum Note (Signed)
Addended byMarga Melnick F on: 02/13/2023 10:02 AM   Modules accepted: Level of Service

## 2023-02-13 NOTE — ED Notes (Signed)
Per note, intermittent vomiting x 2 weeks

## 2023-02-13 NOTE — ED Triage Notes (Signed)
Pt from [facility] center, had labs and potassium was 2.9. pt arrived alert. Oriented to person and place but could not tell me day/date/year or why he is at hospital. Pt denies pain. States he feels a little tired but that is it. Pt denies n/v/d/sob when asked. Color wnl.

## 2023-02-13 NOTE — ED Provider Notes (Signed)
[facility] AT [facility] Provider Note   CSN: [id number] Arrival date & time: 02/13/23  1036     History  Chief Complaint  Patient presents with   Abnormal Lab    Zachary [name] is a 77 y.o. male.  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  Patient is brought in from his nursing facility for lab work done today showing low potassium of 2.9.  Patient denies any complaints although is very limited historian.  No other information available from patient.  On review of his medical records he has a history of end-stage kidney disease and receives dialysis.  Per PCP note he has been having 2 weeks of nausea and vomiting.  Has a history of colon cancer status post colectomy.  The history is provided by the EMS personnel.  Abnormal Lab Time since result:  Hours Patient referred by:  PCP Resulting agency:  Internal Result type: chemistry   Chemistry:    Potassium:  Low      Home Medications Prior to Admission medications   Medication Sig Start Date End Date Taking? Authorizing Provider  aspirin EC 81 MG tablet Take 81 mg by mouth daily. 0900    [provider]  atorvastatin (LIPITOR) 20 MG tablet Take 20 mg by mouth at bedtime. 2000    [provider]  Insulin Glargine (BASAGLAR KWIKPEN) 100 UNIT/ML Inject 8 Units into the skin at bedtime. 2100 DO NOT HOLD this insulin without provider notification 10/03/22   Maretta Bees, MD  Insulin Pen Needle (BD AUTOSHIELD DUO) 30G X 5 MM MISC by Does not apply route. 3/16"    [provider]  levothyroxine (SYNTHROID) 125 MCG tablet Take 125 mcg by mouth at bedtime. 2100    [provider]  midodrine (PROAMATINE) 10 MG tablet Take 10 mg by mouth See admin instructions. Send med with resident on  Tues/Thurs/Sat to dialysis. do not give at [facility], give to resident to take with him. send with  to equal . 6:00 06/01/19   [provider]  midodrine  (PROAMATINE) 5 MG tablet Take 5 mg by mouth See admin instructions. Send with resident on dialysis days.  Tues/Thurs/Sat  Take along with 10 mg to equal 15 mg 6:00 06/01/19   [provider]  NON FORMULARY Diet: NAS, Cons CHO    [provider]  omega-3 acid ethyl esters (LOVAZA) 1 g capsule Take 2 g by mouth at bedtime. 2100 06/03/19   [provider]  omeprazole (PRILOSEC) 40 MG capsule Take 40 mg by mouth daily. 0900 01/23/20   [provider]  ondansetron (ZOFRAN) 8 MG tablet Take 8 mg by mouth every 6 (six) hours as needed for nausea or vomiting.    [provider]  polyethylene glycol (MIRALAX / GLYCOLAX) packet Take 17 g by mouth daily as needed for mild constipation.  05/27/20   [provider]  sevelamer carbonate (RENVELA) 800 MG tablet Take 3 tablets (2,400 mg total) by mouth 3 (three) times daily with meals. 10/03/22   Ghimire, Werner Lean, MD  tamsulosin (FLOMAX) 0.4 MG CAPS capsule Take 0.4 mg by mouth daily at 6 PM. Give 30 minutes after a meal. Do not crush or chew    [provider]  torsemide (DEMADEX) 20 MG tablet Take 20 mg by mouth as directed. Sunday, Monday, Wednesday, and Friday    [provider]  vitamin B-12 (CYANOCOBALAMIN) 1000 MCG tablet Take 1,000 mcg by  mouth daily.    [provider]      Allergies    Patient has no known allergies.    Review of Systems   Review of Systems  Unable to perform ROS: Other    Physical Exam Updated Vital Signs BP (!) 154/76 (BP Location: Right Arm)   Pulse 68   Temp 98 F (36.7 C) (Oral)   Resp 18   SpO2 98%  Physical Exam Vitals and nursing note reviewed.  Constitutional:      General: He is not in acute distress.    Appearance: Normal appearance. He is well-developed.  HENT:     Head: Normocephalic and atraumatic.  Eyes:     Conjunctiva/sclera: Conjunctivae normal.  Cardiovascular:     Rate and Rhythm: Normal rate and regular rhythm.     Heart  sounds: No murmur heard. Pulmonary:     Effort: Pulmonary effort is normal. No respiratory distress.     Breath sounds: Normal breath sounds.  Abdominal:     General: There is distension.     Palpations: Abdomen is soft.     Tenderness: There is no abdominal tenderness. There is no guarding or rebound.  Musculoskeletal:        General: No deformity.     Cervical back: Neck supple.  Skin:    General: Skin is warm and dry.     Capillary Refill: Capillary refill takes less than 2 seconds.  Neurological:     General: No focal deficit present.     Mental Status: He is alert.     ED Results / Procedures / Treatments   Labs (all labs ordered are listed, but only abnormal results are displayed) Labs Reviewed  MAGNESIUM  POC OCCULT BLOOD, ED  TYPE AND SCREEN    EKG EKG Interpretation  Date/Time:  Friday February 13 2023 10:56:16 EDT Ventricular Rate:  76 PR Interval:  196 QRS Duration: 90 QT Interval:  425 QTC Calculation: 478 R Axis:   3 Text Interpretation: Sinus rhythm ST elevation, consider inferior injury Borderline prolonged QT interval No significant change since prior 12/23 Confirmed by Meridee Score [phone]) on 02/13/2023 10:59:46 AM  Radiology No results found.  Procedures Procedures    Medications Ordered in ED Medications  potassium chloride SA (KLOR-CON M) CR tablet 40 mEq (40 mEq Oral Given 02/13/23 1238)    ED Course/ Medical Decision Making/ A&P Clinical Course as of 02/13/23 1717  Fri Feb 13, 2023  1059 ECG not crossing in epic.  Sinus rhythm.  Nonspecific ST elevations no significant change from prior 12/23 [MB]    Clinical Course User Index [MB] Terrilee Files, MD                             Medical Decision Making Amount and/or Complexity of Data Reviewed Labs: ordered.  Risk Prescription drug management.   This patient complains of low potassium; this involves an extensive number of treatment Options and is a complaint that carries  with it a high risk of complications and morbidity. The differential includes lab error, hypokalemia, metabolic derangement, dehydration  I ordered, reviewed and interpreted labs, which included potassium low, normal magnesium, hemoglobin lower than priors I ordered medication oral potassium and reviewed PMP when indicated. Additional history obtained from EMS Previous records obtained and reviewed in epic including recent PCP note.  They will comment upon some nausea and vomiting.  His hemoglobin is low newly today.  Rectal exam done and heme-negative Cardiac monitoring reviewed, sinus rhythm Social determinants considered, no significant barriers Critical Interventions: None  After the interventions stated above, I reevaluated the patient and found patient to be asymptomatic with no complaints Admission and further testing considered, no indications for admission at this time.  Will need repeat lab work and continued workup by PCP.  Return instructions discussed         Final Clinical Impression(s) / ED Diagnoses Final diagnoses:  Hypokalemia    Rx / DC Orders ED Discharge Orders     None         Terrilee Files, MD 02/13/23 1718

## 2023-02-14 DIAGNOSIS — Z992 Dependence on renal dialysis: Secondary | ICD-10-CM | POA: Diagnosis not present

## 2023-02-14 DIAGNOSIS — D631 Anemia in chronic kidney disease: Secondary | ICD-10-CM | POA: Diagnosis not present

## 2023-02-14 DIAGNOSIS — N186 End stage renal disease: Secondary | ICD-10-CM | POA: Diagnosis not present

## 2023-02-14 DIAGNOSIS — D509 Iron deficiency anemia, unspecified: Secondary | ICD-10-CM | POA: Diagnosis not present

## 2023-02-14 DIAGNOSIS — N250 Renal osteodystrophy: Secondary | ICD-10-CM | POA: Diagnosis not present

## 2023-02-14 DIAGNOSIS — N2581 Secondary hyperparathyroidism of renal origin: Secondary | ICD-10-CM | POA: Diagnosis not present

## 2023-02-16 ENCOUNTER — Encounter: Payer: Self-pay | Admitting: Adult Health

## 2023-02-16 ENCOUNTER — Non-Acute Institutional Stay (SKILLED_NURSING_FACILITY): Payer: Medicare Other | Admitting: Adult Health

## 2023-02-16 DIAGNOSIS — Z992 Dependence on renal dialysis: Secondary | ICD-10-CM | POA: Diagnosis not present

## 2023-02-16 DIAGNOSIS — E876 Hypokalemia: Secondary | ICD-10-CM | POA: Diagnosis not present

## 2023-02-16 DIAGNOSIS — D631 Anemia in chronic kidney disease: Secondary | ICD-10-CM | POA: Diagnosis not present

## 2023-02-16 DIAGNOSIS — N186 End stage renal disease: Secondary | ICD-10-CM | POA: Diagnosis not present

## 2023-02-16 DIAGNOSIS — Z794 Long term (current) use of insulin: Secondary | ICD-10-CM | POA: Diagnosis not present

## 2023-02-16 DIAGNOSIS — D696 Thrombocytopenia, unspecified: Secondary | ICD-10-CM

## 2023-02-16 DIAGNOSIS — E1151 Type 2 diabetes mellitus with diabetic peripheral angiopathy without gangrene: Secondary | ICD-10-CM | POA: Diagnosis not present

## 2023-02-16 DIAGNOSIS — M2141 Flat foot [pes planus] (acquired), right foot: Secondary | ICD-10-CM | POA: Diagnosis not present

## 2023-02-16 DIAGNOSIS — B351 Tinea unguium: Secondary | ICD-10-CM | POA: Diagnosis not present

## 2023-02-16 NOTE — Progress Notes (Signed)
Location:  [facility] Room Number: 112D Place of Service:  SNF (31)   CODE STATUS: Full Code  No Known Allergies  Chief Complaint  Patient presents with   Acute Visit    Follow up ED    HPI:  He was sent to the ED for low k+ level. He had told the ED staff that he had had nausea and vomiting with blood for about 2 weeks. There are no reports of this at the facility. He had a k+ of 2.9. he remains on hemodialysis three days per week. His k+ was replaced and he has returned to back to the facility. Today he tells me that he is feeling good. Does have some bleeding gums on the right side.   Past Medical History:  Diagnosis Date   Abnormal CT scan, kidney 10/06/2011   Acute pyelonephritis 10/07/2011   Anemia    normocytic   Anxiety    mental retardation   Bladder wall thickening 10/06/2011   BPH (benign prostatic hypertrophy)    Diabetes mellitus    Dialysis patient (HCC)    Tuesday, Thursday and Saturday,    DVT of leg (deep venous thrombosis) (HCC) 12/25/2016   Edema     history of lower extremity edema   GERD (gastroesophageal reflux disease)    Heme positive stool    Hydronephrosis    Hyperkalemia    Hyperlipidemia    Hypernatremia    Hypertension    Hypothyroidism    Impaired speech    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HCC)    MR (mental retardation)    Muscle weakness    Obstructive uropathy    Perinephric abscess 10/07/2011   Poor historian poor historian   Primary colorectal adenocarcinoma (HCC) 04/2017   S/p total colectomy   Protein calorie malnutrition (HCC)    Pyelonephritis    Renal failure (ARF), acute on chronic (HCC)    Renal insufficiency    chronic history   Sepsis (HCC)    Smoking    Uremia    Urinary retention    UTI (lower urinary tract infection) 10/06/2011    Past Surgical History:  Procedure Laterality Date   A/V FISTULAGRAM N/A 08/13/2018   Procedure: A/V FISTULAGRAM - Right Upper;  Surgeon: Sherren Kerns, MD;  Location: [facility] INVASIVE CV LAB;  Service: Cardiovascular;  Laterality: N/A;   A/V FISTULAGRAM N/A 11/22/2018   Procedure: A/V FISTULAGRAM - Right Upper;  Surgeon: Maeola Harman, MD;  Location: [facility] INVASIVE CV LAB;  Service: Cardiovascular;  Laterality: N/A;   A/V FISTULAGRAM Left 10/08/2022   Procedure: A/V Fistulagram;  Surgeon: Victorino Sparrow, MD;  Location: [facility] INVASIVE CV LAB;  Service: Cardiovascular;  Laterality: Left;   AV FISTULA PLACEMENT Left 07/06/2015   Procedure:  INSERTION LEFT ARM ARTERIOVENOUS GORTEX GRAFT;  Surgeon: Chuck Hint, MD;  Location: [facility] OR;  Service: Vascular;  Laterality: Left;   AV FISTULA PLACEMENT Right 02/26/2016   Procedure: ARTERIOVENOUS (AV) FISTULA CREATION ;  Surgeon: Chuck Hint, MD;  Location: [facility] OR;  Service: Vascular;  Laterality: Right;   AV FISTULA PLACEMENT Right 11/25/2018   Procedure: INSERTION OF ARTERIOVENOUS (AV) ARTEGRAFT RIGHT UPPER ARM;  Surgeon: Maeola Harman, MD;  Location: [facility] OR;  Service: Vascular;  Laterality: Right;   AV FISTULA PLACEMENT Left 05/28/2021   Procedure: LEFT ARM ARTERIOVENOUS (AV) FISTULA;  Surgeon: Larina Earthly, MD;  Location: [facility] ORS;  Service: Vascular;  Laterality: Left;   AVGG  REMOVAL Left 10/09/2015   Procedure: REMOVAL OF ARTERIOVENOUS GORETEX GRAFT (AVGG) Evacuation of Lymphocele, Vein Patch angioplasty of brachial artery.;  Surgeon: Chuck Hint, MD;  Location: [facility] OR;  Service: Vascular;  Laterality: Left;   BASCILIC VEIN TRANSPOSITION Right 02/26/2016   Procedure: Right BASCILIC VEIN TRANSPOSITION;  Surgeon: Chuck Hint, MD;  Location: [facility] OR;  Service: Vascular;  Laterality: Right;   BASCILIC VEIN TRANSPOSITION Left 07/30/2021   Procedure: LEFT ARM SECOND STAGE BASILIC VEIN TRANSPOSITION;  Surgeon: Larina Earthly, MD;  Location: [facility] ORS;  Service: Vascular;  Laterality: Left;   CIRCUMCISION N/A 01/04/2014   Procedure: CIRCUMCISION ADULT  (procedure #1);  Surgeon: Ky Barban, MD;  Location: [facility] ORS;  Service: Urology;  Laterality: N/A;   COLECTOMY N/A 05/04/2017   Procedure: TOTAL COLECTOMY;  Surgeon: Franky Macho, MD;  Location: [facility] ORS;  Service: General;  Laterality: N/A;   COLONOSCOPY N/A 04/27/2017   Surgeon: Corbin Ade, MD; annular mass in the ascending colon likely representing cancer biopsied, multiple 6-22 mm polyps removed, clean rectum.  Pathology with multiple tubular adenomas, high-grade dysplasia noted in ascending colon and splenic flexure.   CYSTOSCOPY W/ RETROGRADES Bilateral 06/29/2015   Procedure: CYSTOSCOPY, DILATION OF URETHRAL STRICTURE WITH BILATERAL RETROGRADE PYELOGRAM,SUPRAPUBIC TUBE CHANGE;  Surgeon: Jerilee Field, MD;  Location: [facility] ORS;  Service: Urology;  Laterality: Bilateral;   CYSTOSCOPY WITH URETHRAL DILATATION N/A 12/29/2013   Procedure: CYSTOSCOPY WITH URETHRAL DILATATION;  Surgeon: Ky Barban, MD;  Location: [facility]S;  Service: Urology;  Laterality: N/A;   ESOPHAGOGASTRODUODENOSCOPY N/A 04/27/2017   Procedure: ESOPHAGOGASTRODUODENOSCOPY (EGD);  Surgeon: Corbin Ade, MD;  Location: [facility];  Service: Endoscopy;  Laterality: N/A;   FLEXIBLE SIGMOIDOSCOPY N/A 12/02/2021   Procedure: FLEXIBLE SIGMOIDOSCOPY;  Surgeon: Lanelle Bal, DO;  Location: [facility];  Service: Endoscopy;  Laterality: N/A;  10:30am, dialysis patient   INSERTION OF DIALYSIS CATHETER Right 11/25/2018   Procedure: INSERTION OF DIALYSIS CATHETER RIGHT INTERNAL JUGULAR;  Surgeon: Maeola Harman, MD;  Location: [facility] OR;  Service: Vascular;  Laterality: Right;   IR AV DIALY SHUNT INTRO NEEDLE/INTRACATH INITIAL W/PTA/IMG RIGHT Right 09/07/2018   IR FLUORO GUIDE CV LINE RIGHT  10/16/2020   IR FLUORO GUIDE CV LINE RIGHT  10/03/2022   IR REMOVAL TUN CV CATH W/O FL  01/12/2019   IR THROMBECTOMY AV FISTULA W/THROMBOLYSIS/PTA INC/SHUNT/IMG RIGHT Right 04/26/2018   IR US GUIDE VASC ACCESS RIGHT   04/26/2018   IR US GUIDE VASC ACCESS RIGHT  09/07/2018   IR US GUIDE VASC ACCESS RIGHT  10/16/2020   IR US GUIDE VASC ACCESS RIGHT  10/16/2020   IR US GUIDE VASC ACCESS RIGHT  10/03/2022   ORIF FEMUR FRACTURE Right 11/22/2016   Procedure: OPEN REDUCTION INTERNAL FIXATION (ORIF) DISTAL FEMUR FRACTURE;  Surgeon: Samson Frederic, MD;  Location: [facility] OR;  Service: Orthopedics;  Laterality: Right;   PATCH ANGIOPLASTY Right 12/10/2017   Procedure: PATCH ANGIOPLASTY;  Surgeon: Chuck Hint, MD;  Location: [facility] OR;  Service: Vascular;  Laterality: Right;   PERIPHERAL VASCULAR BALLOON ANGIOPLASTY  08/13/2018   Procedure: PERIPHERAL VASCULAR BALLOON ANGIOPLASTY;  Surgeon: Sherren Kerns, MD;  Location: [facility] INVASIVE CV LAB;  Service: Cardiovascular;;  right AV fistula    PERIPHERAL VASCULAR BALLOON ANGIOPLASTY  11/22/2018   Procedure: PERIPHERAL VASCULAR BALLOON ANGIOPLASTY;  Surgeon: Maeola Harman, MD;  Location: [facility] INVASIVE CV LAB;  Service: Cardiovascular;;  rt AV fistula   PERIPHERAL VASCULAR BALLOON ANGIOPLASTY  10/08/2022   Procedure: PERIPHERAL VASCULAR BALLOON ANGIOPLASTY;  Surgeon: Victorino Sparrow, MD;  Location: [facility] INVASIVE CV LAB;  Service: Cardiovascular;;  arterial anastamosis   PERIPHERAL VASCULAR CATHETERIZATION N/A 10/08/2015   Procedure: A/V Shuntogram;  Surgeon: Chuck Hint, MD;  Location: [facility] INVASIVE CV LAB;  Service: Cardiovascular;  Laterality: N/A;   PERIPHERAL VASCULAR INTERVENTION Left 10/08/2022   Procedure: PERIPHERAL VASCULAR INTERVENTION;  Surgeon: Victorino Sparrow, MD;  Location: [facility] INVASIVE CV LAB;  Service: Cardiovascular;  Laterality: Left;  left AVF   REMOVAL OF A DIALYSIS CATHETER N/A 12/03/2021   Procedure: MINOR REMOVAL OF A TUNNELED DIALYSIS CATHETER;  Surgeon: Larina Earthly, MD;  Location: [facility];  Service: Vascular;  Laterality: N/A;   REMOVAL OF A DIALYSIS CATHETER N/A 11/18/2022   Procedure: MINOR REMOVAL OF A TUNNELED DIALYSIS CATHETER;   Surgeon: Larina Earthly, MD;  Location: [facility];  Service: Vascular;  Laterality: N/A;   THROMBECTOMY W/ EMBOLECTOMY Right 12/10/2017   Procedure: THROMBECTOMY REVISION RIGHT ARM  ARTERIOVENOUS FISTULA;  Surgeon: Chuck Hint, MD;  Location: [facility] OR;  Service: Vascular;  Laterality: Right;   TRANSURETHRAL RESECTION OF PROSTATE N/A 01/04/2014   Procedure: TRANSURETHRAL RESECTION OF THE PROSTATE (TURP) (procedure #2);  Surgeon: Ky Barban, MD;  Location: [facility];  Service: Urology;  Laterality: N/A;    Social History   Socioeconomic History   Marital status: Single    Spouse name: Not on file   Number of children: Not on file   Years of education: Not on file   Highest education level: Not on file  Occupational History   Occupation: retired   Tobacco Use   Smoking status: Never   Smokeless tobacco: Never  Vaping Use   Vaping Use: Never used  Substance and Sexual Activity   Alcohol use: No   Drug use: No   Sexual activity: Not Currently  Other Topics Concern   Not on file  Social History Narrative   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   Long term resident of [facility]    Social Determinants of Health   Financial Resource Strain: Low Risk  (02/18/2018)   Overall Financial Resource Strain (CARDIA)    Difficulty of Paying Living Expenses: Not hard at all  Food Insecurity: No Food Insecurity (02/18/2018)   Hunger Vital Sign    Worried About Running Out of Food in the Last Year: Never true    Ran Out of Food in the Last Year: Never true  Transportation Needs: No Transportation Needs (02/18/2018)   PRAPARE - Administrator, Civil Service (Medical): No    Lack of Transportation (Non-Medical): No  Physical Activity: Unknown (09/15/2019)   Exercise Vital Sign    Days of Exercise per Week: Not on file    Minutes of Exercise per Session: 0 min  Stress: No Stress Concern Present (02/18/2018)   [employer] of Occupational  Health - Occupational Stress Questionnaire    Feeling of Stress : Not at all  Social Connections: Unknown (09/15/2019)   Social Connection and Isolation Panel [NHANES]    Frequency of Communication with Friends and Family: Not on file    Frequency of Social Gatherings with Friends and Family: Never    Attends Religious Services: Not on file    Active Member of Clubs or Organizations: Not on file    Attends Banker Meetings: Not on file    Marital Status: Not on file  Intimate Partner  Violence: Not At Risk (02/18/2018)   Humiliation, Afraid, Rape, and Kick questionnaire    Fear of Current or Ex-Partner: No    Emotionally Abused: No    Physically Abused: No    Sexually Abused: No   Family History  Problem Relation Age of Onset   Cancer Mother    Colon cancer Neg Hx       VITAL SIGNS BP 132/85   Pulse 89   Temp (!) 96 F (35.6 C)   Resp 20   Ht 5\' 8"  (1.727 m)   Wt 208 lb (94.3 kg)   SpO2 96%   BMI 31.63 kg/m   Outpatient Encounter Medications as of 02/16/2023  Medication Sig   aspirin EC 81 MG tablet Take 81 mg by mouth daily. 0900   atorvastatin (LIPITOR) 20 MG tablet Take 20 mg by mouth at bedtime. 2000   Insulin Glargine (BASAGLAR KWIKPEN) 100 UNIT/ML Inject 8 Units into the skin at bedtime. 2100 DO NOT HOLD this insulin without provider notification   Insulin Pen Needle (BD AUTOSHIELD DUO) 30G X 5 MM MISC by Does not apply route. 3/16"   levothyroxine (SYNTHROID) 125 MCG tablet Take 125 mcg by mouth at bedtime. 2100   midodrine (PROAMATINE) 10 MG tablet Take 10 mg by mouth See admin instructions. Send med with resident on  Tues/Thurs/Sat to dialysis. do not give at [facility], give to resident to take with him. send with 5mg  to equal 15mg . 6:00   midodrine (PROAMATINE) 5 MG tablet Take 5 mg by mouth See admin instructions. Send with resident on dialysis days.  Tues/Thurs/Sat  Take along with 10 mg to equal 15 mg 6:00   NON FORMULARY Diet: NAS, Cons CHO    omega-3 acid ethyl esters (LOVAZA) 1 g capsule Take 2 g by mouth at bedtime. 2100   omeprazole (PRILOSEC) 40 MG capsule Take 40 mg by mouth daily. 0900   ondansetron (ZOFRAN) 8 MG tablet Take 8 mg by mouth every 6 (six) hours as needed for nausea or vomiting.   polyethylene glycol (MIRALAX / GLYCOLAX) packet Take 17 g by mouth daily as needed for mild constipation.    sevelamer carbonate (RENVELA) 800 MG tablet Take 3 tablets (2,400 mg total) by mouth 3 (three) times daily with meals.   tamsulosin (FLOMAX) 0.4 MG CAPS capsule Take 0.4 mg by mouth daily at 6 PM. Give 30 minutes after a meal. Do not crush or chew   torsemide (DEMADEX) 20 MG tablet Take 20 mg by mouth as directed. Sunday, Monday, Wednesday, and Friday   vitamin B-12 (CYANOCOBALAMIN) 1000 MCG tablet Take 1,000 mcg by mouth daily.   No facility-administered encounter medications on file as of 02/16/2023.     SIGNIFICANT DIAGNOSTIC EXAMS  PREVIOUS   12-30-21: ct of abdomen and pelvis:  1. No significant change in size or appearance of the hypodense lesion in the head of the pancreas, possible cyst or IPMN. Recommend continued follow-up with contrast-enhanced MRI or CT in 2 years. 2. Cholelithiasis. 3. Left renal calculus. Bilateral renal mild atrophy with multiple renal cysts. 4. Other chronic findings as described.  06-11-22: dexa: t score: -2.052  NO NEW EXAMS.    LABS REVIEWED PREVIOUS;      01-28-22; wbc 4.8; hgb 8.7; hct 26.0; mcv 105.3; plt 113; glucose 1; bun 40; creat 11.35; k+ 3.8; na++ 141; ca 8.8; GFR 4 d-dimer 0.43; CRP <0.5  06-19-22: hgb a1c 5.8; tsh 2.539 free t4: 0.98 09-29-22: wbc 4.3; hgb 12.9;  hct 41.1; mcv 108.4 plt 108; glucose 112; bun 44; creat 16.87; k+ 4.6; na++ 139; ca 9.3 gfr 3; protein 7.0; albumin 3.5; hgb A1c 5.0; tsh 5.861 chol 89; ldl 41; trig 68; hdl 35 10-08-22: glucose 101; bun 22; creat 12.30; k+ 5.2; na++ 141; ion ca 1.14;   TODAY  02-13-23: wbc 3.8; hgb 9.7; hct 28.7; mcv 101.1 plt 126;  glucose 67; bun 18; creat 6.28; k+ 2.9; na++ 137; ca 8.8; gfr 9; protein 6.9; albumin 3.2 mag 1.8; occult blood: neg   Review of Systems  Constitutional:  Negative for malaise/fatigue.  Respiratory:  Negative for cough and shortness of breath.   Cardiovascular:  Negative for chest pain, palpitations and leg swelling.  Gastrointestinal:  Negative for abdominal pain, constipation and heartburn.  Musculoskeletal:  Negative for back pain, joint pain and myalgias.  Skin: Negative.   Neurological:  Negative for dizziness.  Psychiatric/Behavioral:  The patient is not nervous/anxious.    Physical Exam Constitutional:      General: He is not in acute distress.    Appearance: He is well-developed. He is obese. He is not diaphoretic.  HENT:     Head:     Comments: Has some bleeding gums on right side  Neck:     Thyroid: No thyromegaly.  Cardiovascular:     Rate and Rhythm: Normal rate and regular rhythm.     Pulses: Normal pulses.     Heart sounds: Normal heart sounds.  Pulmonary:     Effort: Pulmonary effort is normal. No respiratory distress.     Breath sounds: Normal breath sounds.  Abdominal:     General: Bowel sounds are normal. There is no distension.     Palpations: Abdomen is soft.     Tenderness: There is no abdominal tenderness.  Musculoskeletal:        General: Normal range of motion.     Cervical back: Neck supple.  Lymphadenopathy:     Cervical: No cervical adenopathy.  Skin:    General: Skin is warm and dry.     Comments: Left arm fistula +thrill+bruit     Neurological:     Mental Status: He is alert. Mental status is at baseline.  Psychiatric:        Mood and Affect: Mood normal.      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  Will monitor his status.    Synthia Innocent NP [facility]   call [phone]

## 2023-02-17 DIAGNOSIS — N186 End stage renal disease: Secondary | ICD-10-CM | POA: Diagnosis not present

## 2023-02-17 DIAGNOSIS — Z992 Dependence on renal dialysis: Secondary | ICD-10-CM | POA: Diagnosis not present

## 2023-02-17 DIAGNOSIS — N2581 Secondary hyperparathyroidism of renal origin: Secondary | ICD-10-CM | POA: Diagnosis not present

## 2023-02-17 DIAGNOSIS — D631 Anemia in chronic kidney disease: Secondary | ICD-10-CM | POA: Diagnosis not present

## 2023-02-17 DIAGNOSIS — D509 Iron deficiency anemia, unspecified: Secondary | ICD-10-CM | POA: Diagnosis not present

## 2023-02-17 DIAGNOSIS — N250 Renal osteodystrophy: Secondary | ICD-10-CM | POA: Diagnosis not present

## 2023-02-19 DIAGNOSIS — N2581 Secondary hyperparathyroidism of renal origin: Secondary | ICD-10-CM | POA: Diagnosis not present

## 2023-02-19 DIAGNOSIS — N186 End stage renal disease: Secondary | ICD-10-CM | POA: Diagnosis not present

## 2023-02-19 DIAGNOSIS — Z992 Dependence on renal dialysis: Secondary | ICD-10-CM | POA: Diagnosis not present

## 2023-02-19 DIAGNOSIS — D631 Anemia in chronic kidney disease: Secondary | ICD-10-CM | POA: Diagnosis not present

## 2023-02-19 DIAGNOSIS — N250 Renal osteodystrophy: Secondary | ICD-10-CM | POA: Diagnosis not present

## 2023-02-19 DIAGNOSIS — D509 Iron deficiency anemia, unspecified: Secondary | ICD-10-CM | POA: Diagnosis not present

## 2023-02-20 DIAGNOSIS — E876 Hypokalemia: Secondary | ICD-10-CM | POA: Insufficient documentation

## 2023-02-21 DIAGNOSIS — D509 Iron deficiency anemia, unspecified: Secondary | ICD-10-CM | POA: Diagnosis not present

## 2023-02-21 DIAGNOSIS — N250 Renal osteodystrophy: Secondary | ICD-10-CM | POA: Diagnosis not present

## 2023-02-21 DIAGNOSIS — Z992 Dependence on renal dialysis: Secondary | ICD-10-CM | POA: Diagnosis not present

## 2023-02-21 DIAGNOSIS — N186 End stage renal disease: Secondary | ICD-10-CM | POA: Diagnosis not present

## 2023-02-21 DIAGNOSIS — N2581 Secondary hyperparathyroidism of renal origin: Secondary | ICD-10-CM | POA: Diagnosis not present

## 2023-02-21 DIAGNOSIS — D631 Anemia in chronic kidney disease: Secondary | ICD-10-CM | POA: Diagnosis not present

## 2023-02-21 NOTE — Progress Notes (Unsigned)
Referring Provider: Sharee Holster, NP Primary Care Physician:  Sharee Holster, NP Primary GI Physician: Dr. Jena Gauss  No chief complaint on file.   HPI:   Zachary [name] is a 77 y.o. male with GI history of GERD, multiple adenomatous colon polyps and invasive colorectal adenocarcinoma in 2018 s/p total colectomy with side-to-side colorectal anastomosis in July 2018, presenting today for ***  Per nursing home note 02/12/23, nausea/vomiting x 2 weeks, more so at night after laying down, some blood in the vomit. He was found to have potassium low at 2.9 and was sent to the ED for replacement and referred to GI for N/V.   4/19, K 2.9, Hgb 9.7 with macrocytic indices, platelets 126, lipase 59, FOBT negative. ECG without significant abnormality. Potassium was replaced.   Today:     Wt Readings from Last 5 Encounters:  02/16/23 208 lb (94.3 kg)  01/15/23 210 lb 3.2 oz (95.3 kg)  01/01/23 210 lb 3.2 oz (95.3 kg)  12/12/22 205 lb 12.8 oz (93.4 kg)  12/01/22 205 lb 12.8 oz (93.4 kg)     Past Medical History:  Diagnosis Date   Abnormal CT scan, kidney 10/06/2011   Acute pyelonephritis 10/07/2011   Anemia    normocytic   Anxiety    mental retardation   Bladder wall thickening 10/06/2011   BPH (benign prostatic hypertrophy)    Diabetes mellitus    Dialysis patient (HCC)    Tuesday, Thursday and Saturday,    DVT of leg (deep venous thrombosis) (HCC) 12/25/2016   Edema     history of lower extremity edema   GERD (gastroesophageal reflux disease)    Heme positive stool    Hydronephrosis    Hyperkalemia    Hyperlipidemia    Hypernatremia    Hypertension    Hypothyroidism    Impaired speech    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HCC)    MR (mental retardation)    Muscle weakness    Obstructive uropathy    Perinephric abscess 10/07/2011   Poor historian poor historian   Primary colorectal adenocarcinoma (HCC) 04/2017   S/p total colectomy   Protein calorie malnutrition  (HCC)    Pyelonephritis    Renal failure (ARF), acute on chronic (HCC)    Renal insufficiency    chronic history   Sepsis (HCC)    Smoking    Uremia    Urinary retention    UTI (lower urinary tract infection) 10/06/2011    Past Surgical History:  Procedure Laterality Date   A/V FISTULAGRAM N/A 08/13/2018   Procedure: A/V FISTULAGRAM - Right Upper;  Surgeon: Sherren Kerns, MD;  Location: [facility] INVASIVE CV LAB;  Service: Cardiovascular;  Laterality: N/A;   A/V FISTULAGRAM N/A 11/22/2018   Procedure: A/V FISTULAGRAM - Right Upper;  Surgeon: Maeola Harman, MD;  Location: [facility] INVASIVE CV LAB;  Service: Cardiovascular;  Laterality: N/A;   A/V FISTULAGRAM Left 10/08/2022   Procedure: A/V Fistulagram;  Surgeon: Victorino Sparrow, MD;  Location: [facility] INVASIVE CV LAB;  Service: Cardiovascular;  Laterality: Left;   AV FISTULA PLACEMENT Left 07/06/2015   Procedure:  INSERTION LEFT ARM ARTERIOVENOUS GORTEX GRAFT;  Surgeon: Chuck Hint, MD;  Location: [facility] OR;  Service: Vascular;  Laterality: Left;   AV FISTULA PLACEMENT Right 02/26/2016   Procedure: ARTERIOVENOUS (AV) FISTULA CREATION ;  Surgeon: Chuck Hint, MD;  Location: [facility] OR;  Service: Vascular;  Laterality: Right;   AV FISTULA PLACEMENT Right 11/25/2018  Procedure: INSERTION OF ARTERIOVENOUS (AV) ARTEGRAFT RIGHT UPPER ARM;  Surgeon: Maeola Harman, MD;  Location: [facility] OR;  Service: Vascular;  Laterality: Right;   AV FISTULA PLACEMENT Left 05/28/2021   Procedure: LEFT ARM ARTERIOVENOUS (AV) FISTULA;  Surgeon: Larina Earthly, MD;  Location: [facility] ORS;  Service: Vascular;  Laterality: Left;   AVGG REMOVAL Left 10/09/2015   Procedure: REMOVAL OF ARTERIOVENOUS GORETEX GRAFT (AVGG) Evacuation of Lymphocele, Vein Patch angioplasty of brachial artery.;  Surgeon: Chuck Hint, MD;  Location: [facility] OR;  Service: Vascular;  Laterality: Left;   BASCILIC VEIN TRANSPOSITION Right 02/26/2016   Procedure: Right BASCILIC  VEIN TRANSPOSITION;  Surgeon: Chuck Hint, MD;  Location: [facility] OR;  Service: Vascular;  Laterality: Right;   BASCILIC VEIN TRANSPOSITION Left 07/30/2021   Procedure: LEFT ARM SECOND STAGE BASILIC VEIN TRANSPOSITION;  Surgeon: Larina Earthly, MD;  Location: [facility];  Service: Vascular;  Laterality: Left;   CIRCUMCISION N/A 01/04/2014   Procedure: CIRCUMCISION ADULT (procedure #1);  Surgeon: Ky Barban, MD;  Location: [facility];  Service: Urology;  Laterality: N/A;   COLECTOMY N/A 05/04/2017   Procedure: TOTAL COLECTOMY;  Surgeon: Franky Macho, MD;  Location: [facility];  Service: General;  Laterality: N/A;   COLONOSCOPY N/A 04/27/2017   Surgeon: Corbin Ade, MD; annular mass in the ascending colon likely representing cancer biopsied, multiple 6-22 mm polyps removed, clean rectum.  Pathology with multiple tubular adenomas, high-grade dysplasia noted in ascending colon and splenic flexure.   CYSTOSCOPY W/ RETROGRADES Bilateral 06/29/2015   Procedure: CYSTOSCOPY, DILATION OF URETHRAL STRICTURE WITH BILATERAL RETROGRADE PYELOGRAM,SUPRAPUBIC TUBE CHANGE;  Surgeon: Jerilee Field, MD;  Location: [facility] ORS;  Service: Urology;  Laterality: Bilateral;   CYSTOSCOPY WITH URETHRAL DILATATION N/A 12/29/2013   Procedure: CYSTOSCOPY WITH URETHRAL DILATATION;  Surgeon: Ky Barban, MD;  Location: [facility];  Service: Urology;  Laterality: N/A;   ESOPHAGOGASTRODUODENOSCOPY N/A 04/27/2017   Procedure: ESOPHAGOGASTRODUODENOSCOPY (EGD);  Surgeon: Corbin Ade, MD;  [facility];  Service: Endoscopy;  Laterality: N/A;   FLEXIBLE SIGMOIDOSCOPY N/A 12/02/2021   Procedure: FLEXIBLE SIGMOIDOSCOPY;  Surgeon: Lanelle Bal, DO;  [facility];  Service: Endoscopy;  Laterality: N/A;  10:30am, dialysis patient   INSERTION OF DIALYSIS CATHETER Right 11/25/2018   Procedure: INSERTION OF DIALYSIS CATHETER RIGHT INTERNAL JUGULAR;  Surgeon: Maeola Harman, MD;  Location: [facility] OR;   Service: Vascular;  Laterality: Right;   IR AV DIALY SHUNT INTRO NEEDLE/INTRACATH INITIAL W/PTA/IMG RIGHT Right 09/07/2018   IR FLUORO GUIDE CV LINE RIGHT  10/16/2020   IR FLUORO GUIDE CV LINE RIGHT  10/03/2022   IR REMOVAL TUN CV CATH W/O FL  01/12/2019   IR THROMBECTOMY AV FISTULA W/THROMBOLYSIS/PTA INC/SHUNT/IMG RIGHT Right 04/26/2018   IR US GUIDE VASC ACCESS RIGHT  04/26/2018   IR US GUIDE VASC ACCESS RIGHT  09/07/2018   IR US GUIDE VASC ACCESS RIGHT  10/16/2020   IR US GUIDE VASC ACCESS RIGHT  10/16/2020   IR US GUIDE VASC ACCESS RIGHT  10/03/2022   ORIF FEMUR FRACTURE Right 11/22/2016   Procedure: OPEN REDUCTION INTERNAL FIXATION (ORIF) DISTAL FEMUR FRACTURE;  Surgeon: Samson Frederic, MD;  Location: [facility] OR;  Service: Orthopedics;  Laterality: Right;   PATCH ANGIOPLASTY Right 12/10/2017   Procedure: PATCH ANGIOPLASTY;  Surgeon: Chuck Hint, MD;  Location: [facility] OR;  Service: Vascular;  Laterality: Right;   PERIPHERAL VASCULAR BALLOON ANGIOPLASTY  08/13/2018   Procedure: PERIPHERAL VASCULAR BALLOON ANGIOPLASTY;  Surgeon:  Sherren Kerns, MD;  Location: [facility] INVASIVE CV LAB;  Service: Cardiovascular;;  right AV fistula    PERIPHERAL VASCULAR BALLOON ANGIOPLASTY  11/22/2018   Procedure: PERIPHERAL VASCULAR BALLOON ANGIOPLASTY;  Surgeon: Maeola Harman, MD;  Location: [facility] INVASIVE CV LAB;  Service: Cardiovascular;;  rt AV fistula   PERIPHERAL VASCULAR BALLOON ANGIOPLASTY  10/08/2022   Procedure: PERIPHERAL VASCULAR BALLOON ANGIOPLASTY;  Surgeon: Victorino Sparrow, MD;  Location: [facility] INVASIVE CV LAB;  Service: Cardiovascular;;  arterial anastamosis   PERIPHERAL VASCULAR CATHETERIZATION N/A 10/08/2015   Procedure: A/V Shuntogram;  Surgeon: Chuck Hint, MD;  Location: [facility] INVASIVE CV LAB;  Service: Cardiovascular;  Laterality: N/A;   PERIPHERAL VASCULAR INTERVENTION Left 10/08/2022   Procedure: PERIPHERAL VASCULAR INTERVENTION;  Surgeon: Victorino Sparrow, MD;  Location:  [facility] INVASIVE CV LAB;  Service: Cardiovascular;  Laterality: Left;  left AVF   REMOVAL OF A DIALYSIS CATHETER N/A 12/03/2021   Procedure: MINOR REMOVAL OF A TUNNELED DIALYSIS CATHETER;  Surgeon: Larina Earthly, MD;  Location: [facility];  Service: Vascular;  Laterality: N/A;   REMOVAL OF A DIALYSIS CATHETER N/A 11/18/2022   Procedure: MINOR REMOVAL OF A TUNNELED DIALYSIS CATHETER;  Surgeon: Larina Earthly, MD;  Location: [facility];  Service: Vascular;  Laterality: N/A;   THROMBECTOMY W/ EMBOLECTOMY Right 12/10/2017   Procedure: THROMBECTOMY REVISION RIGHT ARM  ARTERIOVENOUS FISTULA;  Surgeon: Chuck Hint, MD;  Location: [facility] OR;  Service: Vascular;  Laterality: Right;   TRANSURETHRAL RESECTION OF PROSTATE N/A 01/04/2014   Procedure: TRANSURETHRAL RESECTION OF THE PROSTATE (TURP) (procedure #2);  Surgeon: Ky Barban, MD;  Location: [facility];  Service: Urology;  Laterality: N/A;    Current Outpatient Medications  Medication Sig Dispense Refill   aspirin EC 81 MG tablet Take 81 mg by mouth daily. 0900     atorvastatin (LIPITOR) 20 MG tablet Take 20 mg by mouth at bedtime. 2000     Insulin Glargine (BASAGLAR KWIKPEN) 100 UNIT/ML Inject 8 Units into the skin at bedtime. 2100 DO NOT HOLD this insulin without provider notification     Insulin Pen Needle (BD AUTOSHIELD DUO) 30G X 5 MM MISC by Does not apply route. 3/16"     levothyroxine (SYNTHROID) 125 MCG tablet Take 125 mcg by mouth at bedtime. 2100     midodrine (PROAMATINE) 10 MG tablet Take 10 mg by mouth See admin instructions. Send med with resident on  Tues/Thurs/Sat to dialysis. do not give at [facility], give to resident to take with him. send with 5mg  to equal 15mg . 6:00     midodrine (PROAMATINE) 5 MG tablet Take 5 mg by mouth See admin instructions. Send with resident on dialysis days.  Tues/Thurs/Sat  Take along with 10 mg to equal 15 mg 6:00     NON FORMULARY Diet: NAS, Cons CHO     omega-3 acid ethyl esters (LOVAZA) 1 g capsule Take 2  g by mouth at bedtime. 2100     omeprazole (PRILOSEC) 40 MG capsule Take 40 mg by mouth daily. 0900     ondansetron (ZOFRAN) 8 MG tablet Take 8 mg by mouth every 6 (six) hours as needed for nausea or vomiting.     polyethylene glycol (MIRALAX / GLYCOLAX) packet Take 17 g by mouth daily as needed for mild constipation.      sevelamer carbonate (RENVELA) 800 MG tablet Take 3 tablets (2,400 mg total) by mouth 3 (three) times daily with meals.     tamsulosin (FLOMAX)  0.4 MG CAPS capsule Take 0.4 mg by mouth daily at 6 PM. Give 30 minutes after a meal. Do not crush or chew     torsemide (DEMADEX) 20 MG tablet Take 20 mg by mouth as directed. Sunday, Monday, Wednesday, and Friday     vitamin B-12 (CYANOCOBALAMIN) 1000 MCG tablet Take 1,000 mcg by mouth daily.     No current facility-administered medications for this visit.    Allergies as of 02/23/2023   (No Known Allergies)    Family History  Problem Relation Age of Onset   Cancer Mother    Colon cancer Neg Hx     Social History   Socioeconomic History   Marital status: Single    Spouse name: Not on file   Number of children: Not on file   Years of education: Not on file   Highest education level: Not on file  Occupational History   Occupation: retired   Tobacco Use   Smoking status: Never   Smokeless tobacco: Never  Vaping Use   Vaping Use: Never used  Substance and Sexual Activity   Alcohol use: No   Drug use: No   Sexual activity: Not Currently  Other Topics Concern   Not on file  Social History Narrative   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   Long term resident of [facility]    Social Determinants of Health   Financial Resource Strain: Low Risk  (02/18/2018)   Overall Financial Resource Strain (CARDIA)    Difficulty of Paying Living Expenses: Not hard at all  Food Insecurity: No Food Insecurity (02/18/2018)   Hunger Vital Sign    Worried About Running Out of Food in the Last Year:  Never true    Ran Out of Food in the Last Year: Never true  Transportation Needs: No Transportation Needs (02/18/2018)   PRAPARE - Administrator, Civil Service (Medical): No    Lack of Transportation (Non-Medical): No  Physical Activity: Unknown (09/15/2019)   Exercise Vital Sign    Days of Exercise per Week: Not on file    Minutes of Exercise per Session: 0 min  Stress: No Stress Concern Present (02/18/2018)   [employer] of Occupational Health - Occupational Stress Questionnaire    Feeling of Stress : Not at all  Social Connections: Unknown (09/15/2019)   Social Connection and Isolation Panel [NHANES]    Frequency of Communication with Friends and Family: Not on file    Frequency of Social Gatherings with Friends and Family: Never    Attends Religious Services: Not on Marketing executive or Organizations: Not on file    Attends Banker Meetings: Not on file    Marital Status: Not on file    Review of Systems: Gen: Denies fever, chills, anorexia. Denies fatigue, weakness, weight loss.  CV: Denies chest pain, palpitations, syncope, peripheral edema, and claudication. Resp: Denies dyspnea at rest, cough, wheezing, coughing up blood, and pleurisy. GI: Denies vomiting blood, jaundice, and fecal incontinence.   Denies dysphagia or odynophagia. Derm: Denies rash, itching, dry skin Psych: Denies depression, anxiety, memory loss, confusion. No homicidal or suicidal ideation.  Heme: Denies bruising, bleeding, and enlarged lymph nodes.  Physical Exam: There were no vitals taken for this visit. General:   Alert and oriented. No distress noted. Pleasant and cooperative.  Head:  Normocephalic and atraumatic. Eyes:  Conjuctiva clear without scleral icterus. Heart:  S1, S2 present without murmurs appreciated. Lungs:  Clear to auscultation bilaterally. No wheezes, rales, or rhonchi. No distress.  Abdomen:  +BS, soft, non-tender and non-distended. No  rebound or guarding. No HSM or masses noted. Msk:  Symmetrical without gross deformities. Normal posture. Extremities:  Without edema. Neurologic:  Alert and  oriented x4 Psych:  Normal mood and affect.    Assessment:     Plan:  ***   Ermalinda Memos, PA-C [facility] 02/23/2023

## 2023-02-23 ENCOUNTER — Ambulatory Visit (INDEPENDENT_AMBULATORY_CARE_PROVIDER_SITE_OTHER): Payer: Medicare Other | Admitting: Gastroenterology

## 2023-02-23 ENCOUNTER — Other Ambulatory Visit (HOSPITAL_COMMUNITY)
Admission: RE | Admit: 2023-02-23 | Discharge: 2023-02-23 | Disposition: A | Discharge status: Home / Self Care | Payer: Medicare Other | Source: Skilled Nursing Facility | Attending: Adult Health | Admitting: Adult Health

## 2023-02-23 ENCOUNTER — Encounter: Payer: Self-pay | Admitting: Gastroenterology

## 2023-02-23 VITALS — BP 136/75 | HR 78 | Temp 97.7°F | Wt 193.0 lb

## 2023-02-23 DIAGNOSIS — R634 Abnormal weight loss: Secondary | ICD-10-CM | POA: Diagnosis not present

## 2023-02-23 DIAGNOSIS — K920 Hematemesis: Secondary | ICD-10-CM

## 2023-02-23 DIAGNOSIS — I120 Hypertensive chronic kidney disease with stage 5 chronic kidney disease or end stage renal disease: Secondary | ICD-10-CM | POA: Insufficient documentation

## 2023-02-23 DIAGNOSIS — D649 Anemia, unspecified: Secondary | ICD-10-CM | POA: Diagnosis not present

## 2023-02-23 DIAGNOSIS — R112 Nausea with vomiting, unspecified: Secondary | ICD-10-CM

## 2023-02-23 LAB — BASIC METABOLIC PANEL
Anion gap: 8 (ref 5–15)
BUN: 24 mg/dL — ABNORMAL HIGH (ref 8–23)
CO2: 28 mmol/L (ref 22–32)
Calcium: 9.6 mg/dL (ref 8.9–10.3)
Chloride: 103 mmol/L (ref 98–111)
Creatinine, Ser: 8.32 mg/dL — ABNORMAL HIGH (ref 0.61–1.24)
GFR, Estimated: 6 mL/min — ABNORMAL LOW (ref >60)
Glucose, Bld: 110 mg/dL — ABNORMAL HIGH (ref 70–99)
Potassium: 3.6 mmol/L (ref 3.5–5.1)
Sodium: 139 mmol/L (ref 135–145)

## 2023-02-23 LAB — HEMOGLOBIN AND HEMATOCRIT, BLOOD
HCT: 29.7 % — ABNORMAL LOW (ref 39.0–52.0)
Hemoglobin: 9.8 g/dL — ABNORMAL LOW (ref 13.0–17.0)

## 2023-02-23 NOTE — Patient Instructions (Addendum)
I recommend that we proceed with an upper endoscopy to further evaluate nausea, vomiting, and vomiting of blood along with declining hemoglobin and weight loss.  Please let me know your decision.  I am ordering some blood work to check your iron, B12, and folate levels.  Continue omeprazole 40 mg daily.  Try to eat 75-100% of all of your meals.  Eat 2 snacks and or nutritional supplements daily.  Follow-up date to be determined.  Ermalinda Memos, PA-C [facility]

## 2023-02-24 ENCOUNTER — Telehealth: Payer: Self-pay | Admitting: *Deleted

## 2023-02-24 DIAGNOSIS — D631 Anemia in chronic kidney disease: Secondary | ICD-10-CM | POA: Diagnosis not present

## 2023-02-24 DIAGNOSIS — Z992 Dependence on renal dialysis: Secondary | ICD-10-CM | POA: Diagnosis not present

## 2023-02-24 DIAGNOSIS — N250 Renal osteodystrophy: Secondary | ICD-10-CM | POA: Diagnosis not present

## 2023-02-24 DIAGNOSIS — N2581 Secondary hyperparathyroidism of renal origin: Secondary | ICD-10-CM | POA: Diagnosis not present

## 2023-02-24 DIAGNOSIS — D509 Iron deficiency anemia, unspecified: Secondary | ICD-10-CM | POA: Diagnosis not present

## 2023-02-24 DIAGNOSIS — N186 End stage renal disease: Secondary | ICD-10-CM | POA: Diagnosis not present

## 2023-02-24 NOTE — Telephone Encounter (Signed)
[facility]  EGD w/Dr.rourk ASA 3

## 2023-02-26 DIAGNOSIS — N250 Renal osteodystrophy: Secondary | ICD-10-CM | POA: Diagnosis not present

## 2023-02-26 DIAGNOSIS — Z992 Dependence on renal dialysis: Secondary | ICD-10-CM | POA: Diagnosis not present

## 2023-02-26 DIAGNOSIS — D631 Anemia in chronic kidney disease: Secondary | ICD-10-CM | POA: Diagnosis not present

## 2023-02-26 DIAGNOSIS — N2581 Secondary hyperparathyroidism of renal origin: Secondary | ICD-10-CM | POA: Diagnosis not present

## 2023-02-26 DIAGNOSIS — N186 End stage renal disease: Secondary | ICD-10-CM | POA: Diagnosis not present

## 2023-02-26 DIAGNOSIS — D509 Iron deficiency anemia, unspecified: Secondary | ICD-10-CM | POA: Diagnosis not present

## 2023-02-27 DIAGNOSIS — R488 Other symbolic dysfunctions: Secondary | ICD-10-CM | POA: Diagnosis not present

## 2023-02-27 DIAGNOSIS — Z1211 Encounter for screening for malignant neoplasm of colon: Secondary | ICD-10-CM | POA: Diagnosis not present

## 2023-02-28 DIAGNOSIS — N2581 Secondary hyperparathyroidism of renal origin: Secondary | ICD-10-CM | POA: Diagnosis not present

## 2023-02-28 DIAGNOSIS — D509 Iron deficiency anemia, unspecified: Secondary | ICD-10-CM | POA: Diagnosis not present

## 2023-02-28 DIAGNOSIS — N250 Renal osteodystrophy: Secondary | ICD-10-CM | POA: Diagnosis not present

## 2023-02-28 DIAGNOSIS — D631 Anemia in chronic kidney disease: Secondary | ICD-10-CM | POA: Diagnosis not present

## 2023-02-28 DIAGNOSIS — Z992 Dependence on renal dialysis: Secondary | ICD-10-CM | POA: Diagnosis not present

## 2023-02-28 DIAGNOSIS — N186 End stage renal disease: Secondary | ICD-10-CM | POA: Diagnosis not present

## 2023-03-02 DIAGNOSIS — R488 Other symbolic dysfunctions: Secondary | ICD-10-CM | POA: Diagnosis not present

## 2023-03-02 DIAGNOSIS — Z1211 Encounter for screening for malignant neoplasm of colon: Secondary | ICD-10-CM | POA: Diagnosis not present

## 2023-03-03 DIAGNOSIS — Z992 Dependence on renal dialysis: Secondary | ICD-10-CM | POA: Diagnosis not present

## 2023-03-03 DIAGNOSIS — R488 Other symbolic dysfunctions: Secondary | ICD-10-CM | POA: Diagnosis not present

## 2023-03-03 DIAGNOSIS — Z1211 Encounter for screening for malignant neoplasm of colon: Secondary | ICD-10-CM | POA: Diagnosis not present

## 2023-03-03 DIAGNOSIS — N186 End stage renal disease: Secondary | ICD-10-CM | POA: Diagnosis not present

## 2023-03-03 DIAGNOSIS — N250 Renal osteodystrophy: Secondary | ICD-10-CM | POA: Diagnosis not present

## 2023-03-03 DIAGNOSIS — N2581 Secondary hyperparathyroidism of renal origin: Secondary | ICD-10-CM | POA: Diagnosis not present

## 2023-03-03 DIAGNOSIS — D509 Iron deficiency anemia, unspecified: Secondary | ICD-10-CM | POA: Diagnosis not present

## 2023-03-03 DIAGNOSIS — D631 Anemia in chronic kidney disease: Secondary | ICD-10-CM | POA: Diagnosis not present

## 2023-03-04 DIAGNOSIS — R488 Other symbolic dysfunctions: Secondary | ICD-10-CM | POA: Diagnosis not present

## 2023-03-04 DIAGNOSIS — Z1211 Encounter for screening for malignant neoplasm of colon: Secondary | ICD-10-CM | POA: Diagnosis not present

## 2023-03-05 DIAGNOSIS — D631 Anemia in chronic kidney disease: Secondary | ICD-10-CM | POA: Diagnosis not present

## 2023-03-05 DIAGNOSIS — R488 Other symbolic dysfunctions: Secondary | ICD-10-CM | POA: Diagnosis not present

## 2023-03-05 DIAGNOSIS — N250 Renal osteodystrophy: Secondary | ICD-10-CM | POA: Diagnosis not present

## 2023-03-05 DIAGNOSIS — N2581 Secondary hyperparathyroidism of renal origin: Secondary | ICD-10-CM | POA: Diagnosis not present

## 2023-03-05 DIAGNOSIS — Z992 Dependence on renal dialysis: Secondary | ICD-10-CM | POA: Diagnosis not present

## 2023-03-05 DIAGNOSIS — N186 End stage renal disease: Secondary | ICD-10-CM | POA: Diagnosis not present

## 2023-03-05 DIAGNOSIS — Z1211 Encounter for screening for malignant neoplasm of colon: Secondary | ICD-10-CM | POA: Diagnosis not present

## 2023-03-05 DIAGNOSIS — D509 Iron deficiency anemia, unspecified: Secondary | ICD-10-CM | POA: Diagnosis not present

## 2023-03-06 DIAGNOSIS — R488 Other symbolic dysfunctions: Secondary | ICD-10-CM | POA: Diagnosis not present

## 2023-03-06 DIAGNOSIS — Z1211 Encounter for screening for malignant neoplasm of colon: Secondary | ICD-10-CM | POA: Diagnosis not present

## 2023-03-07 DIAGNOSIS — D631 Anemia in chronic kidney disease: Secondary | ICD-10-CM | POA: Diagnosis not present

## 2023-03-07 DIAGNOSIS — D509 Iron deficiency anemia, unspecified: Secondary | ICD-10-CM | POA: Diagnosis not present

## 2023-03-07 DIAGNOSIS — N2581 Secondary hyperparathyroidism of renal origin: Secondary | ICD-10-CM | POA: Diagnosis not present

## 2023-03-07 DIAGNOSIS — Z992 Dependence on renal dialysis: Secondary | ICD-10-CM | POA: Diagnosis not present

## 2023-03-07 DIAGNOSIS — N250 Renal osteodystrophy: Secondary | ICD-10-CM | POA: Diagnosis not present

## 2023-03-07 DIAGNOSIS — N186 End stage renal disease: Secondary | ICD-10-CM | POA: Diagnosis not present

## 2023-03-09 DIAGNOSIS — R488 Other symbolic dysfunctions: Secondary | ICD-10-CM | POA: Diagnosis not present

## 2023-03-09 DIAGNOSIS — Z1211 Encounter for screening for malignant neoplasm of colon: Secondary | ICD-10-CM | POA: Diagnosis not present

## 2023-03-10 DIAGNOSIS — D509 Iron deficiency anemia, unspecified: Secondary | ICD-10-CM | POA: Diagnosis not present

## 2023-03-10 DIAGNOSIS — D631 Anemia in chronic kidney disease: Secondary | ICD-10-CM | POA: Diagnosis not present

## 2023-03-10 DIAGNOSIS — R488 Other symbolic dysfunctions: Secondary | ICD-10-CM | POA: Diagnosis not present

## 2023-03-10 DIAGNOSIS — Z992 Dependence on renal dialysis: Secondary | ICD-10-CM | POA: Diagnosis not present

## 2023-03-10 DIAGNOSIS — N2581 Secondary hyperparathyroidism of renal origin: Secondary | ICD-10-CM | POA: Diagnosis not present

## 2023-03-10 DIAGNOSIS — N250 Renal osteodystrophy: Secondary | ICD-10-CM | POA: Diagnosis not present

## 2023-03-10 DIAGNOSIS — N186 End stage renal disease: Secondary | ICD-10-CM | POA: Diagnosis not present

## 2023-03-10 DIAGNOSIS — Z1211 Encounter for screening for malignant neoplasm of colon: Secondary | ICD-10-CM | POA: Diagnosis not present

## 2023-03-12 DIAGNOSIS — N186 End stage renal disease: Secondary | ICD-10-CM | POA: Diagnosis not present

## 2023-03-12 DIAGNOSIS — N250 Renal osteodystrophy: Secondary | ICD-10-CM | POA: Diagnosis not present

## 2023-03-12 DIAGNOSIS — Z992 Dependence on renal dialysis: Secondary | ICD-10-CM | POA: Diagnosis not present

## 2023-03-12 DIAGNOSIS — D509 Iron deficiency anemia, unspecified: Secondary | ICD-10-CM | POA: Diagnosis not present

## 2023-03-12 DIAGNOSIS — N2581 Secondary hyperparathyroidism of renal origin: Secondary | ICD-10-CM | POA: Diagnosis not present

## 2023-03-12 DIAGNOSIS — D631 Anemia in chronic kidney disease: Secondary | ICD-10-CM | POA: Diagnosis not present

## 2023-03-13 ENCOUNTER — Non-Acute Institutional Stay (SKILLED_NURSING_FACILITY): Payer: Medicare Other | Admitting: Adult Health

## 2023-03-13 ENCOUNTER — Encounter: Payer: Self-pay | Admitting: Adult Health

## 2023-03-13 DIAGNOSIS — N186 End stage renal disease: Secondary | ICD-10-CM | POA: Diagnosis not present

## 2023-03-13 DIAGNOSIS — Z992 Dependence on renal dialysis: Secondary | ICD-10-CM

## 2023-03-13 DIAGNOSIS — I700 Atherosclerosis of aorta: Secondary | ICD-10-CM

## 2023-03-13 DIAGNOSIS — E1122 Type 2 diabetes mellitus with diabetic chronic kidney disease: Secondary | ICD-10-CM

## 2023-03-13 DIAGNOSIS — D696 Thrombocytopenia, unspecified: Secondary | ICD-10-CM

## 2023-03-13 NOTE — Progress Notes (Signed)
Location:  [facility] Room Number: 112 Place of Service:  SNF (31)   CODE STATUS: FULL CODE  No Known Allergies  Chief Complaint  Patient presents with   Acute Visit    Care plan meeting    HPI:  We have come together for his care plan meeting. Family present. BIMS 7/15 mood 0/30. Nonambulatory uses wheelchair; no falls. He is independent with his adls. He is occasionally incontinent of bladder and bowel. Dietary: feeds self; regular diet weight is 219 pounds; good appetite. Therapy: ST for stuttering. Activities: spends time outside. 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  Dependence on renal dialysis  Past Medical History:  Diagnosis Date   Abnormal CT scan, kidney 10/06/2011   Acute pyelonephritis 10/07/2011   Anemia    normocytic   Anxiety    mental retardation   Bladder wall thickening 10/06/2011   BPH (benign prostatic hypertrophy)    Diabetes mellitus    Dialysis patient (HCC)    Tuesday, Thursday and Saturday,    DVT of leg (deep venous thrombosis) (HCC) 12/25/2016   Edema     history of lower extremity edema   GERD (gastroesophageal reflux disease)    Heme positive stool    Hydronephrosis    Hyperkalemia    Hyperlipidemia    Hypernatremia    Hypertension    Hypothyroidism    Impaired speech    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HCC)    MR (mental retardation)    Muscle weakness    Obstructive uropathy    Perinephric abscess 10/07/2011   Poor historian poor historian   Primary colorectal adenocarcinoma (HCC) 04/2017   S/p total colectomy   Protein calorie malnutrition (HCC)    Pyelonephritis    Renal failure (ARF), acute on chronic (HCC)    Renal insufficiency    chronic history   Sepsis (HCC)    Smoking    Uremia    Urinary retention    UTI (lower urinary tract infection) 10/06/2011     Past Surgical History:  Procedure Laterality Date   A/V FISTULAGRAM N/A 08/13/2018   Procedure: A/V FISTULAGRAM - Right Upper;  Surgeon: Sherren Kerns, MD;  Location: [facility] INVASIVE CV LAB;  Service: Cardiovascular;  Laterality: N/A;   A/V FISTULAGRAM N/A 11/22/2018   Procedure: A/V FISTULAGRAM - Right Upper;  Surgeon: Maeola Harman, MD;  Location: [facility] INVASIVE CV LAB;  Service: Cardiovascular;  Laterality: N/A;   A/V FISTULAGRAM Left 10/08/2022   Procedure: A/V Fistulagram;  Surgeon: Victorino Sparrow, MD;  Location: [facility] INVASIVE CV LAB;  Service: Cardiovascular;  Laterality: Left;   AV FISTULA PLACEMENT Left 07/06/2015   Procedure:  INSERTION LEFT ARM ARTERIOVENOUS GORTEX GRAFT;  Surgeon: Chuck Hint, MD;  Location: [facility] OR;  Service: Vascular;  Laterality: Left;   AV FISTULA PLACEMENT Right 02/26/2016   Procedure: ARTERIOVENOUS (AV) FISTULA CREATION ;  Surgeon: Chuck Hint, MD;  Location: [facility] OR;  Service: Vascular;  Laterality: Right;   AV FISTULA PLACEMENT Right 11/25/2018   Procedure: INSERTION OF ARTERIOVENOUS (AV) ARTEGRAFT RIGHT UPPER ARM;  Surgeon: Maeola Harman, MD;  Location: [facility] OR;  Service: Vascular;  Laterality: Right;   AV FISTULA PLACEMENT Left 05/28/2021   Procedure: LEFT ARM ARTERIOVENOUS (AV) FISTULA;  Surgeon: Larina Earthly, MD;  Location: [facility] ORS;  Service: Vascular;  Laterality: Left;  AVGG REMOVAL Left 10/09/2015   Procedure: REMOVAL OF ARTERIOVENOUS GORETEX GRAFT (AVGG) Evacuation of Lymphocele, Vein Patch angioplasty of brachial artery.;  Surgeon: Chuck Hint, MD;  Location: [facility] OR;  Service: Vascular;  Laterality: Left;   BASCILIC VEIN TRANSPOSITION Right 02/26/2016   Procedure: Right BASCILIC VEIN TRANSPOSITION;  Surgeon: Chuck Hint, MD;  Location: [facility] OR;  Service: Vascular;  Laterality: Right;   BASCILIC VEIN TRANSPOSITION Left 07/30/2021   Procedure: LEFT ARM SECOND STAGE BASILIC VEIN TRANSPOSITION;   Surgeon: Larina Earthly, MD;  Location: [facility] ORS;  Service: Vascular;  Laterality: Left;   CIRCUMCISION N/A 01/04/2014   Procedure: CIRCUMCISION ADULT (procedure #1);  Surgeon: Ky Barban, MD;  Location: [facility] ORS;  Service: Urology;  Laterality: N/A;   COLECTOMY N/A 05/04/2017   Procedure: TOTAL COLECTOMY;  Surgeon: Franky Macho, MD;  Location: [facility] ORS;  Service: General;  Laterality: N/A;   COLONOSCOPY N/A 04/27/2017   Surgeon: Corbin Ade, MD; annular mass in the ascending colon likely representing cancer biopsied, multiple 6-22 mm polyps removed, clean rectum.  Pathology with multiple tubular adenomas, high-grade dysplasia noted in ascending colon and splenic flexure.   CYSTOSCOPY W/ RETROGRADES Bilateral 06/29/2015   Procedure: CYSTOSCOPY, DILATION OF URETHRAL STRICTURE WITH BILATERAL RETROGRADE PYELOGRAM,SUPRAPUBIC TUBE CHANGE;  Surgeon: Jerilee Field, MD;  Location: [facility] ORS;  Service: Urology;  Laterality: Bilateral;   CYSTOSCOPY WITH URETHRAL DILATATION N/A 12/29/2013   Procedure: CYSTOSCOPY WITH URETHRAL DILATATION;  Surgeon: Ky Barban, MD;  Location: [facility] ORS;  Service: Urology;  Laterality: N/A;   ESOPHAGOGASTRODUODENOSCOPY N/A 04/27/2017   Procedure: ESOPHAGOGASTRODUODENOSCOPY (EGD);  Surgeon: Corbin Ade, MD;  Location: [facility] ENDO [facility];  Service: Endoscopy;  Laterality: N/A;   FLEXIBLE SIGMOIDOSCOPY N/A 12/02/2021   Procedure: FLEXIBLE SIGMOIDOSCOPY;  Surgeon: Lanelle Bal, DO;  Location: [facility] ENDO [facility];  Service: Endoscopy;  Laterality: N/A;  10:30am, dialysis patient   INSERTION OF DIALYSIS CATHETER Right 11/25/2018   Procedure: INSERTION OF DIALYSIS CATHETER RIGHT INTERNAL JUGULAR;  Surgeon: Maeola Harman, MD;  Location: [facility] OR;  Service: Vascular;  Laterality: Right;   IR AV DIALY SHUNT INTRO NEEDLE/INTRACATH INITIAL W/PTA/IMG RIGHT Right 09/07/2018   IR FLUORO GUIDE CV LINE RIGHT  10/16/2020   IR FLUORO GUIDE CV LINE RIGHT  10/03/2022   IR REMOVAL TUN  CV CATH W/O FL  01/12/2019   IR THROMBECTOMY AV FISTULA W/THROMBOLYSIS/PTA INC/SHUNT/IMG RIGHT Right 04/26/2018   IR US GUIDE VASC ACCESS RIGHT  04/26/2018   IR US GUIDE VASC ACCESS RIGHT  09/07/2018   IR US GUIDE VASC ACCESS RIGHT  10/16/2020   IR US GUIDE VASC ACCESS RIGHT  10/16/2020   IR US GUIDE VASC ACCESS RIGHT  10/03/2022   ORIF FEMUR FRACTURE Right 11/22/2016   Procedure: OPEN REDUCTION INTERNAL FIXATION (ORIF) DISTAL FEMUR FRACTURE;  Surgeon: Samson Frederic, MD;  Location: [facility] OR;  Service: Orthopedics;  Laterality: Right;   PATCH ANGIOPLASTY Right 12/10/2017   Procedure: PATCH ANGIOPLASTY;  Surgeon: Chuck Hint, MD;  Location: [facility] OR;  Service: Vascular;  Laterality: Right;   PERIPHERAL VASCULAR BALLOON ANGIOPLASTY  08/13/2018   Procedure: PERIPHERAL VASCULAR BALLOON ANGIOPLASTY;  Surgeon: Sherren Kerns, MD;  Location: [facility] INVASIVE CV LAB;  Service: Cardiovascular;;  right AV fistula    PERIPHERAL VASCULAR BALLOON ANGIOPLASTY  11/22/2018   Procedure: PERIPHERAL VASCULAR BALLOON ANGIOPLASTY;  Surgeon: Maeola Harman, MD;  Location: [facility] INVASIVE CV LAB;  Service: Cardiovascular;;  rt AV fistula   PERIPHERAL VASCULAR BALLOON ANGIOPLASTY  10/08/2022   Procedure: PERIPHERAL VASCULAR BALLOON ANGIOPLASTY;  Surgeon: Victorino Sparrow, MD;  Location: [facility] INVASIVE CV LAB;  Service: Cardiovascular;;  arterial anastamosis   PERIPHERAL VASCULAR CATHETERIZATION N/A 10/08/2015   Procedure: A/V Shuntogram;  Surgeon: Chuck Hint, MD;  Location: [facility] INVASIVE CV LAB;  Service: Cardiovascular;  Laterality: N/A;   PERIPHERAL VASCULAR INTERVENTION Left 10/08/2022   Procedure: PERIPHERAL VASCULAR INTERVENTION;  Surgeon: Victorino Sparrow, MD;  Location: [facility] INVASIVE CV LAB;  Service: Cardiovascular;  Laterality: Left;  left AVF   REMOVAL OF A DIALYSIS CATHETER N/A 12/03/2021   Procedure: MINOR REMOVAL OF A TUNNELED DIALYSIS CATHETER;  Surgeon: Larina Earthly, MD;  Location: [facility] ORS;   Service: Vascular;  Laterality: N/A;   REMOVAL OF A DIALYSIS CATHETER N/A 11/18/2022   Procedure: MINOR REMOVAL OF A TUNNELED DIALYSIS CATHETER;  Surgeon: Larina Earthly, MD;  Location: [facility] ORS;  Service: Vascular;  Laterality: N/A;   THROMBECTOMY W/ EMBOLECTOMY Right 12/10/2017   Procedure: THROMBECTOMY REVISION RIGHT ARM  ARTERIOVENOUS FISTULA;  Surgeon: Chuck Hint, MD;  Location: [facility] OR;  Service: Vascular;  Laterality: Right;   TRANSURETHRAL RESECTION OF PROSTATE N/A 01/04/2014   Procedure: TRANSURETHRAL RESECTION OF THE PROSTATE (TURP) (procedure #2);  Surgeon: Ky Barban, MD;  Location: [facility] ORS;  Service: Urology;  Laterality: N/A;    Social History   Socioeconomic History   Marital status: Single    Spouse name: Not on file   Number of children: Not on file   Years of education: Not on file   Highest education level: Not on file  Occupational History   Occupation: retired   Tobacco Use   Smoking status: Never   Smokeless tobacco: Never  Vaping Use   Vaping Use: Never used  Substance and Sexual Activity   Alcohol use: No   Drug use: No   Sexual activity: Not Currently  Other Topics Concern   Not on file  Social History Narrative   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   [facility] term resident of [facility]    Social Determinants of Health   Financial Resource Strain: Low Risk  (02/18/2018)   Overall Financial Resource Strain (CARDIA)    Difficulty of Paying Living Expenses: Not hard at all  Food Insecurity: No Food Insecurity (02/18/2018)   Hunger Vital Sign    Worried About Running Out of Food in the Last Year: Never true    Ran Out of Food in the Last Year: Never true  Transportation Needs: No Transportation Needs (02/18/2018)   PRAPARE - Administrator, Civil Service (Medical): No    Lack of Transportation (Non-Medical): No  Physical Activity: Unknown (09/15/2019)   Exercise Vital Sign    Days of Exercise per  Week: Not on file    Minutes of Exercise per Session: 0 min  Stress: No Stress Concern Present (02/18/2018)   [employer] of Occupational Health - Occupational Stress Questionnaire    Feeling of Stress : Not at all  Social Connections: Unknown (09/15/2019)   Social Connection and Isolation Panel [NHANES]    Frequency of Communication with Friends and Family: Not on file    Frequency of Social Gatherings with Friends and Family: Never    Attends Religious Services: Not on file    Active Member of Clubs or Organizations: Not on file    Attends Banker Meetings: Not on file    Marital Status: Not on file  Intimate Partner  Violence: Not At Risk (02/18/2018)   Humiliation, Afraid, Rape, and Kick questionnaire    Fear of Current or Ex-Partner: No    Emotionally Abused: No    Physically Abused: No    Sexually Abused: No   Family History  Problem Relation Age of Onset   Cancer Mother    Colon cancer Neg Hx       VITAL SIGNS BP 121/79   Pulse 71   Temp 98 F (36.7 C)   Resp 20   Ht 5\' 8"  (1.727 m)   Wt 199 lb 6.4 oz (90.4 kg)   SpO2 100%   BMI 30.32 kg/m   Outpatient Encounter Medications as of 03/13/2023  Medication Sig   aspirin EC 81 MG tablet Take 81 mg by mouth daily. 0900   atorvastatin (LIPITOR) 20 MG tablet Take 20 mg by mouth at bedtime. 2000   Insulin Glargine (BASAGLAR KWIKPEN) 100 UNIT/ML Inject 8 Units into the skin at bedtime. 2100 DO NOT HOLD this insulin without provider notification   Insulin Pen Needle (BD AUTOSHIELD DUO) 30G X 5 MM MISC by Does not apply route. 3/16"   levothyroxine (SYNTHROID) 125 MCG tablet Take 125 mcg by mouth at bedtime. 2100   midodrine (PROAMATINE) 10 MG tablet Take 10 mg by mouth See admin instructions. Send med with resident on  Tues/Thurs/Sat to dialysis. do not give at [facility], give to resident to take with him. send with 5mg  to equal 15mg . 6:00   midodrine (PROAMATINE) 5 MG tablet Take 5 mg by mouth See admin  instructions. Send with resident on dialysis days.  Tues/Thurs/Sat  Take along with 10 mg to equal 15 mg 6:00   NON FORMULARY Diet: NAS, Cons CHO   omega-3 acid ethyl esters (LOVAZA) 1 g capsule Take 2 g by mouth at bedtime. 2100   omeprazole (PRILOSEC) 40 MG capsule Take 40 mg by mouth daily. 0900   ondansetron (ZOFRAN) 8 MG tablet Take 8 mg by mouth every 6 (six) hours as needed for nausea or vomiting.   polyethylene glycol (MIRALAX / GLYCOLAX) packet Take 17 g by mouth daily as needed for mild constipation.    sevelamer carbonate (RENVELA) 800 MG tablet Take 3 tablets (2,400 mg total) by mouth 3 (three) times daily with meals.   tamsulosin (FLOMAX) 0.4 MG CAPS capsule Take 0.4 mg by mouth daily at 6 PM. Give 30 minutes after a meal. Do not crush or chew   torsemide (DEMADEX) 20 MG tablet Take 20 mg by mouth as directed. Sunday, Monday, Wednesday, and Friday   vitamin B-12 (CYANOCOBALAMIN) 1000 MCG tablet Take 1,000 mcg by mouth daily.   No facility-administered encounter medications on file as of 03/13/2023.     SIGNIFICANT DIAGNOSTIC EXAMS  PREVIOUS   06-11-22: dexa: t score: -2.052  NO NEW EXAMS.    LABS REVIEWED PREVIOUS;    06-19-22: hgb a1c 5.8; tsh 2.539 free t4: 0.98 09-29-22: wbc 4.3; hgb 12.9; hct 41.1; mcv 108.4 plt 108; glucose 112; bun 44; creat 16.87; k+ 4.6; na++ 139; ca 9.3 gfr 3; protein 7.0; albumin 3.5; hgb A1c 5.0; tsh 5.861 chol 89; ldl 41; trig 68; hdl 35 10-08-22: glucose 101; bun 22; creat 12.30; k+ 5.2; na++ 141; ion ca 1.14;  02-13-23: wbc 3.8; hgb 9.7; hct 28.7; mcv 101.1 plt 126; glucose 67; bun 18; creat 6.28; k+ 2.9; na++ 137; ca 8.8; gfr 9; protein 6.9; albumin 3.2 mag 1.8; occult blood: neg   NO NEW LABS  Review of Systems  Constitutional:  Negative for malaise/fatigue.  Respiratory:  Negative for cough and shortness of breath.   Cardiovascular:  Negative for chest pain, palpitations and leg swelling.  Gastrointestinal:  Negative for abdominal pain,  constipation and heartburn.  Musculoskeletal:  Negative for back pain, joint pain and myalgias.  Skin: Negative.   Neurological:  Negative for dizziness.  Psychiatric/Behavioral:  The patient is not nervous/anxious.    Physical Exam Constitutional:      General: He is not in acute distress.    Appearance: He is well-developed. He is not diaphoretic.  Neck:     Thyroid: No thyromegaly.  Cardiovascular:     Rate and Rhythm: Normal rate and regular rhythm.     Pulses: Normal pulses.     Heart sounds: Normal heart sounds.  Pulmonary:     Effort: Pulmonary effort is normal. No respiratory distress.     Breath sounds: Normal breath sounds.  Abdominal:     General: Bowel sounds are normal. There is no distension.     Palpations: Abdomen is soft.     Tenderness: There is no abdominal tenderness.  Musculoskeletal:        General: Normal range of motion.     Cervical back: Neck supple.     Right lower leg: No edema.     Left lower leg: No edema.  Lymphadenopathy:     Cervical: No cervical adenopathy.  Skin:    General: Skin is warm and dry.     Comments:  Left arm fistula +thrill+bruit      Neurological:     Mental Status: He is alert. Mental status is at baseline.     Comments: stutters  Psychiatric:        Mood and Affect: Mood normal.       ASSESSMENT/ PLAN:  TODAY  Aortic atherosclerosis Thrombocytopenia End stage renal disease on hemodialysis due to type 2 diabetes mellitus Dependence on renal dialysis   Will continue current medications Will continue current plan of care Will continue to monitor his status  Time spent with patient: 40 minutes: medications; plan of care; dietary    Synthia Innocent NP [facility]  call [phone]

## 2023-03-14 DIAGNOSIS — D631 Anemia in chronic kidney disease: Secondary | ICD-10-CM | POA: Diagnosis not present

## 2023-03-14 DIAGNOSIS — N250 Renal osteodystrophy: Secondary | ICD-10-CM | POA: Diagnosis not present

## 2023-03-14 DIAGNOSIS — D509 Iron deficiency anemia, unspecified: Secondary | ICD-10-CM | POA: Diagnosis not present

## 2023-03-14 DIAGNOSIS — Z992 Dependence on renal dialysis: Secondary | ICD-10-CM | POA: Diagnosis not present

## 2023-03-14 DIAGNOSIS — N2581 Secondary hyperparathyroidism of renal origin: Secondary | ICD-10-CM | POA: Diagnosis not present

## 2023-03-14 DIAGNOSIS — N186 End stage renal disease: Secondary | ICD-10-CM | POA: Diagnosis not present

## 2023-03-17 DIAGNOSIS — D631 Anemia in chronic kidney disease: Secondary | ICD-10-CM | POA: Diagnosis not present

## 2023-03-17 DIAGNOSIS — N186 End stage renal disease: Secondary | ICD-10-CM | POA: Diagnosis not present

## 2023-03-17 DIAGNOSIS — Z992 Dependence on renal dialysis: Secondary | ICD-10-CM | POA: Diagnosis not present

## 2023-03-17 DIAGNOSIS — N2581 Secondary hyperparathyroidism of renal origin: Secondary | ICD-10-CM | POA: Diagnosis not present

## 2023-03-17 DIAGNOSIS — D509 Iron deficiency anemia, unspecified: Secondary | ICD-10-CM | POA: Diagnosis not present

## 2023-03-17 DIAGNOSIS — N250 Renal osteodystrophy: Secondary | ICD-10-CM | POA: Diagnosis not present

## 2023-03-19 DIAGNOSIS — N250 Renal osteodystrophy: Secondary | ICD-10-CM | POA: Diagnosis not present

## 2023-03-19 DIAGNOSIS — D509 Iron deficiency anemia, unspecified: Secondary | ICD-10-CM | POA: Diagnosis not present

## 2023-03-19 DIAGNOSIS — N2581 Secondary hyperparathyroidism of renal origin: Secondary | ICD-10-CM | POA: Diagnosis not present

## 2023-03-19 DIAGNOSIS — N186 End stage renal disease: Secondary | ICD-10-CM | POA: Diagnosis not present

## 2023-03-19 DIAGNOSIS — D631 Anemia in chronic kidney disease: Secondary | ICD-10-CM | POA: Diagnosis not present

## 2023-03-19 DIAGNOSIS — Z992 Dependence on renal dialysis: Secondary | ICD-10-CM | POA: Diagnosis not present

## 2023-03-21 DIAGNOSIS — N2581 Secondary hyperparathyroidism of renal origin: Secondary | ICD-10-CM | POA: Diagnosis not present

## 2023-03-21 DIAGNOSIS — N250 Renal osteodystrophy: Secondary | ICD-10-CM | POA: Diagnosis not present

## 2023-03-21 DIAGNOSIS — D509 Iron deficiency anemia, unspecified: Secondary | ICD-10-CM | POA: Diagnosis not present

## 2023-03-21 DIAGNOSIS — D631 Anemia in chronic kidney disease: Secondary | ICD-10-CM | POA: Diagnosis not present

## 2023-03-21 DIAGNOSIS — Z992 Dependence on renal dialysis: Secondary | ICD-10-CM | POA: Diagnosis not present

## 2023-03-21 DIAGNOSIS — N186 End stage renal disease: Secondary | ICD-10-CM | POA: Diagnosis not present

## 2023-03-24 DIAGNOSIS — N250 Renal osteodystrophy: Secondary | ICD-10-CM | POA: Diagnosis not present

## 2023-03-24 DIAGNOSIS — D631 Anemia in chronic kidney disease: Secondary | ICD-10-CM | POA: Diagnosis not present

## 2023-03-24 DIAGNOSIS — D509 Iron deficiency anemia, unspecified: Secondary | ICD-10-CM | POA: Diagnosis not present

## 2023-03-24 DIAGNOSIS — N186 End stage renal disease: Secondary | ICD-10-CM | POA: Diagnosis not present

## 2023-03-24 DIAGNOSIS — N2581 Secondary hyperparathyroidism of renal origin: Secondary | ICD-10-CM | POA: Diagnosis not present

## 2023-03-24 DIAGNOSIS — Z992 Dependence on renal dialysis: Secondary | ICD-10-CM | POA: Diagnosis not present

## 2023-03-26 ENCOUNTER — Encounter: Payer: Self-pay | Admitting: Adult Health

## 2023-03-26 ENCOUNTER — Non-Acute Institutional Stay (SKILLED_NURSING_FACILITY): Payer: Medicare Other | Admitting: Adult Health

## 2023-03-26 DIAGNOSIS — E1122 Type 2 diabetes mellitus with diabetic chronic kidney disease: Secondary | ICD-10-CM | POA: Diagnosis not present

## 2023-03-26 DIAGNOSIS — Z992 Dependence on renal dialysis: Secondary | ICD-10-CM | POA: Diagnosis not present

## 2023-03-26 DIAGNOSIS — N250 Renal osteodystrophy: Secondary | ICD-10-CM | POA: Diagnosis not present

## 2023-03-26 DIAGNOSIS — D631 Anemia in chronic kidney disease: Secondary | ICD-10-CM | POA: Diagnosis not present

## 2023-03-26 DIAGNOSIS — Z794 Long term (current) use of insulin: Secondary | ICD-10-CM | POA: Diagnosis not present

## 2023-03-26 DIAGNOSIS — N186 End stage renal disease: Secondary | ICD-10-CM | POA: Diagnosis not present

## 2023-03-26 DIAGNOSIS — N2581 Secondary hyperparathyroidism of renal origin: Secondary | ICD-10-CM

## 2023-03-26 DIAGNOSIS — R600 Localized edema: Secondary | ICD-10-CM

## 2023-03-26 DIAGNOSIS — E538 Deficiency of other specified B group vitamins: Secondary | ICD-10-CM | POA: Diagnosis not present

## 2023-03-26 DIAGNOSIS — I120 Hypertensive chronic kidney disease with stage 5 chronic kidney disease or end stage renal disease: Secondary | ICD-10-CM | POA: Diagnosis not present

## 2023-03-26 DIAGNOSIS — D509 Iron deficiency anemia, unspecified: Secondary | ICD-10-CM | POA: Diagnosis not present

## 2023-03-26 NOTE — Progress Notes (Signed)
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 There are no reports of uncontrolled pain. He has been pulling out his own teeth. He has declined to see the dentist; does not want dentures.   Past Medical History:  Diagnosis Date   Abnormal CT scan, kidney 10/06/2011   Acute pyelonephritis 10/07/2011   Anemia    normocytic   Anxiety    mental retardation   Bladder wall thickening 10/06/2011   BPH (benign prostatic hypertrophy)    Diabetes mellitus    Dialysis patient (HCC)    Tuesday, Thursday and Saturday,    DVT of leg (deep venous thrombosis) (HCC) 12/25/2016   Edema     history of lower extremity edema   GERD (gastroesophageal reflux disease)    Heme positive stool    Hydronephrosis    Hyperkalemia    Hyperlipidemia    Hypernatremia    Hypertension    Hypothyroidism    Impaired speech    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HCC)    MR (mental retardation)    Muscle weakness    Obstructive uropathy    Perinephric abscess 10/07/2011   Poor historian poor historian   Primary colorectal adenocarcinoma (HCC) 04/2017   S/p total colectomy   Protein calorie malnutrition (HCC)    Pyelonephritis    Renal failure (ARF), acute on chronic (HCC)    Renal insufficiency    chronic history    Sepsis (HCC)    Smoking    Uremia    Urinary retention    UTI (lower urinary tract infection) 10/06/2011    Past Surgical History:  Procedure Laterality Date   A/V FISTULAGRAM N/A 08/13/2018   Procedure: A/V FISTULAGRAM - Right Upper;  Surgeon: Sherren Kerns, MD;  Location: [facility] INVASIVE CV LAB;  Service: Cardiovascular;  Laterality: N/A;   A/V FISTULAGRAM N/A 11/22/2018   Procedure: A/V FISTULAGRAM - Right Upper;  Surgeon: Maeola Harman, MD;  Location: [facility] INVASIVE CV LAB;  Service: Cardiovascular;  Laterality: N/A;   A/V FISTULAGRAM Left 10/08/2022   Procedure: A/V Fistulagram;  Surgeon: Victorino Sparrow, MD;  Location: [facility] INVASIVE CV LAB;  Service: Cardiovascular;  Laterality: Left;   AV FISTULA PLACEMENT Left 07/06/2015   Procedure:  INSERTION LEFT ARM ARTERIOVENOUS GORTEX GRAFT;  Surgeon: Chuck Hint, MD;  Location: [facility] OR;  Service: Vascular;  Laterality: Left;   AV FISTULA PLACEMENT Right 02/26/2016   Procedure: ARTERIOVENOUS (AV) FISTULA CREATION ;  Surgeon: Chuck Hint, MD;  Location: [facility] OR;  Service: Vascular;  Laterality: Right;   AV FISTULA PLACEMENT Right 11/25/2018   Procedure: INSERTION OF ARTERIOVENOUS (AV) ARTEGRAFT RIGHT UPPER ARM;  Surgeon: Maeola Harman, MD;  Location: [facility] OR;  Service: Vascular;  Laterality: Right;   AV FISTULA  PLACEMENT Left 05/28/2021   Procedure: LEFT ARM ARTERIOVENOUS (AV) FISTULA;  Surgeon: Larina Earthly, MD;  Location: [facility] ORS;  Service: Vascular;  Laterality: Left;   AVGG REMOVAL Left 10/09/2015   Procedure: REMOVAL OF ARTERIOVENOUS GORETEX GRAFT (AVGG) Evacuation of Lymphocele, Vein Patch angioplasty of brachial artery.;  Surgeon: Chuck Hint, MD;  Location: [facility] OR;  Service: Vascular;  Laterality: Left;   BASCILIC VEIN TRANSPOSITION Right 02/26/2016   Procedure: Right BASCILIC VEIN TRANSPOSITION;  Surgeon: Chuck Hint, MD;  Location: [facility] OR;  Service: Vascular;  Laterality: Right;    BASCILIC VEIN TRANSPOSITION Left 07/30/2021   Procedure: LEFT ARM SECOND STAGE BASILIC VEIN TRANSPOSITION;  Surgeon: Larina Earthly, MD;  Location: [facility] ORS;  Service: Vascular;  Laterality: Left;   CIRCUMCISION N/A 01/04/2014   Procedure: CIRCUMCISION ADULT (procedure #1);  Surgeon: Ky Barban, MD;  Location: [facility] ORS;  Service: Urology;  Laterality: N/A;   COLECTOMY N/A 05/04/2017   Procedure: TOTAL COLECTOMY;  Surgeon: Franky Macho, MD;  Location: [facility] ORS;  Service: General;  Laterality: N/A;   COLONOSCOPY N/A 04/27/2017   Surgeon: Corbin Ade, MD; annular mass in the ascending colon likely representing cancer biopsied, multiple 6-22 mm polyps removed, clean rectum.  Pathology with multiple tubular adenomas, high-grade dysplasia noted in ascending colon and splenic flexure.   CYSTOSCOPY W/ RETROGRADES Bilateral 06/29/2015   Procedure: CYSTOSCOPY, DILATION OF URETHRAL STRICTURE WITH BILATERAL RETROGRADE PYELOGRAM,SUPRAPUBIC TUBE CHANGE;  Surgeon: Jerilee Field, MD;  Location: [facility] ORS;  Service: Urology;  Laterality: Bilateral;   CYSTOSCOPY WITH URETHRAL DILATATION N/A 12/29/2013   Procedure: CYSTOSCOPY WITH URETHRAL DILATATION;  Surgeon: Ky Barban, MD;  Location: [facility] ORS;  Service: Urology;  Laterality: N/A;   ESOPHAGOGASTRODUODENOSCOPY N/A 04/27/2017   Procedure: ESOPHAGOGASTRODUODENOSCOPY (EGD);  Surgeon: Corbin Ade, MD;  [facility]: [facility];  Service: Endoscopy;  Laterality: N/A;   FLEXIBLE SIGMOIDOSCOPY N/A 12/02/2021   Procedure: FLEXIBLE SIGMOIDOSCOPY;  Surgeon: Lanelle Bal, DO;  Location: [facility];  Service: Endoscopy;  Laterality: N/A;  10:30am, dialysis patient   INSERTION OF DIALYSIS CATHETER Right 11/25/2018   Procedure: INSERTION OF DIALYSIS CATHETER RIGHT INTERNAL JUGULAR;  Surgeon: Maeola Harman, MD;  Location: [facility] OR;  Service: Vascular;  Laterality: Right;   IR AV DIALY SHUNT INTRO NEEDLE/INTRACATH INITIAL W/PTA/IMG RIGHT Right  09/07/2018   IR FLUORO GUIDE CV LINE RIGHT  10/16/2020   IR FLUORO GUIDE CV LINE RIGHT  10/03/2022   IR REMOVAL TUN CV CATH W/O FL  01/12/2019   IR THROMBECTOMY AV FISTULA W/THROMBOLYSIS/PTA INC/SHUNT/IMG RIGHT Right 04/26/2018   IR US GUIDE VASC ACCESS RIGHT  04/26/2018   IR US GUIDE VASC ACCESS RIGHT  09/07/2018   IR US GUIDE VASC ACCESS RIGHT  10/16/2020   IR US GUIDE VASC ACCESS RIGHT  10/16/2020   IR US GUIDE VASC ACCESS RIGHT  10/03/2022   ORIF FEMUR FRACTURE Right 11/22/2016   Procedure: OPEN REDUCTION INTERNAL FIXATION (ORIF) DISTAL FEMUR FRACTURE;  Surgeon: Samson Frederic, MD;  Location: [facility] OR;  Service: Orthopedics;  Laterality: Right;   PATCH ANGIOPLASTY Right 12/10/2017   Procedure: PATCH ANGIOPLASTY;  Surgeon: Chuck Hint, MD;  Location: [facility] OR;  Service: Vascular;  Laterality: Right;   PERIPHERAL VASCULAR BALLOON ANGIOPLASTY  08/13/2018   Procedure: PERIPHERAL VASCULAR BALLOON ANGIOPLASTY;  Surgeon: Sherren Kerns, MD;  Location: [facility] INVASIVE CV LAB;  Service: Cardiovascular;;  right AV fistula    PERIPHERAL VASCULAR BALLOON ANGIOPLASTY  11/22/2018   Procedure:  PERIPHERAL VASCULAR BALLOON ANGIOPLASTY;  Surgeon: Maeola Harman, MD;  Location: [facility] INVASIVE CV LAB;  Service: Cardiovascular;;  rt AV fistula   PERIPHERAL VASCULAR BALLOON ANGIOPLASTY  10/08/2022   Procedure: PERIPHERAL VASCULAR BALLOON ANGIOPLASTY;  Surgeon: Victorino Sparrow, MD;  Location: [facility] INVASIVE CV LAB;  Service: Cardiovascular;;  arterial anastamosis   PERIPHERAL VASCULAR CATHETERIZATION N/A 10/08/2015   Procedure: A/V Shuntogram;  Surgeon: Chuck Hint, MD;  Location: [facility] INVASIVE CV LAB;  Service: Cardiovascular;  Laterality: N/A;   PERIPHERAL VASCULAR INTERVENTION Left 10/08/2022   Procedure: PERIPHERAL VASCULAR INTERVENTION;  Surgeon: Victorino Sparrow, MD;  Location: [facility] INVASIVE CV LAB;  Service: Cardiovascular;  Laterality: Left;  left AVF   REMOVAL OF A DIALYSIS CATHETER N/A  12/03/2021   Procedure: MINOR REMOVAL OF A TUNNELED DIALYSIS CATHETER;  Surgeon: Larina Earthly, MD;  Location: [facility] ORS;  Service: Vascular;  Laterality: N/A;   REMOVAL OF A DIALYSIS CATHETER N/A 11/18/2022   Procedure: MINOR REMOVAL OF A TUNNELED DIALYSIS CATHETER;  Surgeon: Larina Earthly, MD;  Location: [facility];  Service: Vascular;  Laterality: N/A;   THROMBECTOMY W/ EMBOLECTOMY Right 12/10/2017   Procedure: THROMBECTOMY REVISION RIGHT ARM  ARTERIOVENOUS FISTULA;  Surgeon: Chuck Hint, MD;  Location: [facility] OR;  Service: Vascular;  Laterality: Right;   TRANSURETHRAL RESECTION OF PROSTATE N/A 01/04/2014   Procedure: TRANSURETHRAL RESECTION OF THE PROSTATE (TURP) (procedure #2);  Surgeon: Ky Barban, MD;  Location: [facility];  Service: Urology;  Laterality: N/A;    Social History   Socioeconomic History   Marital status: Single    Spouse name: Not on file   Number of children: Not on file   Years of education: Not on file   Highest education level: Not on file  Occupational History   Occupation: retired   Tobacco Use   Smoking status: Never   Smokeless tobacco: Never  Vaping Use   Vaping Use: Never used  Substance and Sexual Activity   Alcohol use: No   Drug use: No   Sexual activity: Not Currently  Other Topics Concern   Not on file  Social History Narrative   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   [facility] term resident [facility]    Social Determinants of Health   Financial Resource Strain: Low Risk  (02/18/2018)   Overall Financial Resource Strain (CARDIA)    Difficulty of Paying Living Expenses: Not hard at all  Food Insecurity: No Food Insecurity (02/18/2018)   Hunger Vital Sign    Worried About Running Out of Food in the Last Year: Never true    Ran Out of Food in the Last Year: Never true  Transportation Needs: No Transportation Needs (02/18/2018)   PRAPARE - Administrator, Civil Service (Medical): No    Lack of  Transportation (Non-Medical): No  Physical Activity: Unknown (09/15/2019)   Exercise Vital Sign    Days of Exercise per Week: Not on file    Minutes of Exercise per Session: 0 min  Stress: No Stress Concern Present (02/18/2018)   [employer] of Occupational Health - Occupational Stress Questionnaire    Feeling of Stress : Not at all  Social Connections: Unknown (09/15/2019)   Social Connection and Isolation Panel [NHANES]    Frequency of Communication with Friends and Family: Not on file    Frequency of Social Gatherings with Friends and Family: Never    Attends Religious Services: Not on file    Active  Member of Clubs or Organizations: Not on file    Attends Club or Organization Meetings: Not on file    Marital Status: Not on file  Intimate Partner Violence: Not At Risk (02/18/2018)   Humiliation, Afraid, Rape, and Kick questionnaire    Fear of Current or Ex-Partner: No    Emotionally Abused: No    Physically Abused: No    Sexually Abused: No   Family History  Problem Relation Age of Onset   Cancer Mother    Colon cancer Neg Hx       VITAL SIGNS BP 109/62   Pulse 78   Temp 98.5 F (36.9 C)   Resp (!) 22   Ht 5\' 8"  (1.727 m)   Wt 199 lb 6.4 oz (90.4 kg)   SpO2 97%   BMI 30.32 kg/m   Outpatient Encounter Medications as of 03/26/2023  Medication Sig   Amino Acids-Protein Hydrolys (PRO-STAT AWC) LIQD 17-100 gram-kcal/30 mL; amt: 30 ml; oral Twice A Day   aspirin EC 81 MG tablet Take 81 mg by mouth daily. 0900   atorvastatin (LIPITOR) 20 MG tablet Take 20 mg by mouth at bedtime. 2000   Insulin Glargine (BASAGLAR KWIKPEN) 100 UNIT/ML Inject 8 Units into the skin at bedtime. 2100 DO NOT HOLD this insulin without provider notification   Insulin Pen Needle (BD AUTOSHIELD DUO) 30G X 5 MM MISC by Does not apply route. 3/16"   levothyroxine (SYNTHROID) 125 MCG tablet Take 125 mcg by mouth at bedtime. 2100   midodrine (PROAMATINE) 10 MG tablet Take 10 mg by mouth See admin  instructions. Send med with resident on  Tues/Thurs/Sat to dialysis. do not give at [facility], give to resident to take with him. send with 5mg  to equal 15mg . 6:00   midodrine (PROAMATINE) 5 MG tablet Take 5 mg by mouth See admin instructions. Send with resident on dialysis days.  Tues/Thurs/Sat  Take along with 10 mg to equal 15 mg 6:00   NON FORMULARY Diet: Regular diet   omega-3 acid ethyl esters (LOVAZA) 1 g capsule Take 2 g by mouth at bedtime. 2100   omeprazole (PRILOSEC) 40 MG capsule Take 40 mg by mouth daily. 0900   ondansetron (ZOFRAN) 8 MG tablet Take 8 mg by mouth every 6 (six) hours as needed for nausea or vomiting.   polyethylene glycol (MIRALAX / GLYCOLAX) packet Take 17 g by mouth daily as needed for mild constipation.    sevelamer carbonate (RENVELA) 800 MG tablet Take 3 tablets (2,400 mg total) by mouth 3 (three) times daily with meals.   tamsulosin (FLOMAX) 0.4 MG CAPS capsule Take 0.4 mg by mouth daily at 6 PM. Give 30 minutes after a meal. Do not crush or chew   torsemide (DEMADEX) 20 MG tablet Take 20 mg by mouth as directed. Sunday, Monday, Wednesday, and Friday   vitamin B-12 (CYANOCOBALAMIN) 1000 MCG tablet Take 1,000 mcg by mouth daily.   No facility-administered encounter medications on file as of 03/26/2023.     SIGNIFICANT DIAGNOSTIC EXAMS  PREVIOUS   06-11-22: dexa: t score: -2.052  NO NEW EXAMS.    LABS REVIEWED PREVIOUS;      06-19-22: hgb a1c 5.8; tsh 2.539 free t4: 0.98 09-29-22: wbc 4.3; hgb 12.9; hct 41.1; mcv 108.4 plt 108; glucose 112; bun 44; creat 16.87; k+ 4.6; na++ 139; ca 9.3 gfr 3; protein 7.0; albumin 3.5; hgb A1c 5.0; tsh 5.861 chol 89; ldl 41; trig 68; hdl 35 10-08-22: glucose 101; bun 22;  creat 12.30; k+ 5.2; na++ 141; ion ca 1.14;   TODAY  02-13-23: wbc 3.8; hgb 9.7; hct 28.7; mcv 101.1 plt 126; glucose 67; bun 18; creat 6.28; k+ 2.9; na++ 137; ca 8.8; gfr 9; protein 6.9; albumin 3.2 mag 1.8; occult blood: neg 02-23-23: hgb 9.8; hct 29.7  glucose 110; bun 24; creat 8.32 k+ 3.6; na++ 139; ca 9.6 gfr 6    Review of Systems  Constitutional:  Negative for malaise/fatigue.  Respiratory:  Negative for cough and shortness of breath.   Cardiovascular:  Negative for chest pain, palpitations and leg swelling.  Gastrointestinal:  Negative for abdominal pain, constipation and heartburn.  Musculoskeletal:  Negative for back pain, joint pain and myalgias.  Skin: Negative.   Neurological:  Negative for dizziness.  Psychiatric/Behavioral:  The patient is not nervous/anxious.     Physical Exam Constitutional:      General: He is not in acute distress.    Appearance: He is well-developed. He is not diaphoretic.  Neck:     Thyroid: No thyromegaly.  Cardiovascular:     Rate and Rhythm: Normal rate and regular rhythm.     Pulses: Normal pulses.     Heart sounds: Normal heart sounds.  Pulmonary:     Effort: Pulmonary effort is normal. No respiratory distress.     Breath sounds: Normal breath sounds.  Abdominal:     General: Bowel sounds are normal. There is no distension.     Palpations: Abdomen is soft.     Tenderness: There is no abdominal tenderness.  Musculoskeletal:        General: Normal range of motion.     Cervical back: Neck supple.  Lymphadenopathy:     Cervical: No cervical adenopathy.  Skin:    General: Skin is warm and dry.     Comments:  Left arm fistula +thrill+bruit       Neurological:     Mental Status: He is alert. Mental status is at baseline.     Comments: stutters  Psychiatric:        Mood and Affect: Mood normal.       ASSESSMENT/ PLAN:  TODAY  Vitamin B 12 deficiency: is on daily supplement  2. Secondary hyperparathyroidism of renal origin: will monitor   3. Bilateral lower extremity edema: will continue demadex 20 mg four times weekly  4. Type 2 diabetes mellitus with hypertension and end stage renal disease on hemodialysis: hgb A1c 5.0; will continue basaglar 8 units nightly is on asa and  statin   PREVIOUS   5. Thrombocytopenia: platelet 108  6. Aortic atherosclerosis (ct 12-30-21) is on asa statin   7. 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: is on hemodialysis three days weekly 1200 cc fluid restriction; does not adhere; midodrine 15 mg with dialysis; renvela 2400 mg three times daily   8. Other specified hypothyroidism: tsh 5.861 will continue synthroid 125 mcg daily   9. 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 Major depression single episode moderate: is presently off medications.   11. Osteopenia: t score -2.052 on supplements  12. GERD without esophagitis: will continue prilosec 40 mg daily   13. History of colon cancder: s/p colectomy last seen by GI on 12-19-21; ct scan done 12-30-21  14. Dyslipidemia associate with type 2 diabetes mellitus: ldl 41; trig 63; will continue lipitor 20 mg daily lovaza 2 gm daily   Will check cmp; lipids; hgb  A1c tsh vitamin B 12; pth   Synthia Innocent NP [facility]  call [phone]

## 2023-03-27 DIAGNOSIS — Z992 Dependence on renal dialysis: Secondary | ICD-10-CM | POA: Diagnosis not present

## 2023-03-27 DIAGNOSIS — N186 End stage renal disease: Secondary | ICD-10-CM | POA: Diagnosis not present

## 2023-03-28 DIAGNOSIS — D509 Iron deficiency anemia, unspecified: Secondary | ICD-10-CM | POA: Diagnosis not present

## 2023-03-28 DIAGNOSIS — N186 End stage renal disease: Secondary | ICD-10-CM | POA: Diagnosis not present

## 2023-03-28 DIAGNOSIS — N2581 Secondary hyperparathyroidism of renal origin: Secondary | ICD-10-CM | POA: Diagnosis not present

## 2023-03-28 DIAGNOSIS — N250 Renal osteodystrophy: Secondary | ICD-10-CM | POA: Diagnosis not present

## 2023-03-28 DIAGNOSIS — Z992 Dependence on renal dialysis: Secondary | ICD-10-CM | POA: Diagnosis not present

## 2023-03-28 DIAGNOSIS — D631 Anemia in chronic kidney disease: Secondary | ICD-10-CM | POA: Diagnosis not present

## 2023-03-31 DIAGNOSIS — D631 Anemia in chronic kidney disease: Secondary | ICD-10-CM | POA: Diagnosis not present

## 2023-03-31 DIAGNOSIS — N2581 Secondary hyperparathyroidism of renal origin: Secondary | ICD-10-CM | POA: Diagnosis not present

## 2023-03-31 DIAGNOSIS — Z992 Dependence on renal dialysis: Secondary | ICD-10-CM | POA: Diagnosis not present

## 2023-03-31 DIAGNOSIS — N250 Renal osteodystrophy: Secondary | ICD-10-CM | POA: Diagnosis not present

## 2023-03-31 DIAGNOSIS — N186 End stage renal disease: Secondary | ICD-10-CM | POA: Diagnosis not present

## 2023-03-31 DIAGNOSIS — D509 Iron deficiency anemia, unspecified: Secondary | ICD-10-CM | POA: Diagnosis not present

## 2023-04-02 DIAGNOSIS — D509 Iron deficiency anemia, unspecified: Secondary | ICD-10-CM | POA: Diagnosis not present

## 2023-04-02 DIAGNOSIS — Z992 Dependence on renal dialysis: Secondary | ICD-10-CM | POA: Diagnosis not present

## 2023-04-02 DIAGNOSIS — N186 End stage renal disease: Secondary | ICD-10-CM | POA: Diagnosis not present

## 2023-04-02 DIAGNOSIS — N250 Renal osteodystrophy: Secondary | ICD-10-CM | POA: Diagnosis not present

## 2023-04-02 DIAGNOSIS — D631 Anemia in chronic kidney disease: Secondary | ICD-10-CM | POA: Diagnosis not present

## 2023-04-02 DIAGNOSIS — N2581 Secondary hyperparathyroidism of renal origin: Secondary | ICD-10-CM | POA: Diagnosis not present

## 2023-04-04 DIAGNOSIS — N250 Renal osteodystrophy: Secondary | ICD-10-CM | POA: Diagnosis not present

## 2023-04-04 DIAGNOSIS — N186 End stage renal disease: Secondary | ICD-10-CM | POA: Diagnosis not present

## 2023-04-04 DIAGNOSIS — D509 Iron deficiency anemia, unspecified: Secondary | ICD-10-CM | POA: Diagnosis not present

## 2023-04-04 DIAGNOSIS — N2581 Secondary hyperparathyroidism of renal origin: Secondary | ICD-10-CM | POA: Diagnosis not present

## 2023-04-04 DIAGNOSIS — Z992 Dependence on renal dialysis: Secondary | ICD-10-CM | POA: Diagnosis not present

## 2023-04-04 DIAGNOSIS — D631 Anemia in chronic kidney disease: Secondary | ICD-10-CM | POA: Diagnosis not present

## 2023-04-07 DIAGNOSIS — D509 Iron deficiency anemia, unspecified: Secondary | ICD-10-CM | POA: Diagnosis not present

## 2023-04-07 DIAGNOSIS — N250 Renal osteodystrophy: Secondary | ICD-10-CM | POA: Diagnosis not present

## 2023-04-07 DIAGNOSIS — N2581 Secondary hyperparathyroidism of renal origin: Secondary | ICD-10-CM | POA: Diagnosis not present

## 2023-04-07 DIAGNOSIS — Z992 Dependence on renal dialysis: Secondary | ICD-10-CM | POA: Diagnosis not present

## 2023-04-07 DIAGNOSIS — N186 End stage renal disease: Secondary | ICD-10-CM | POA: Diagnosis not present

## 2023-04-07 DIAGNOSIS — D631 Anemia in chronic kidney disease: Secondary | ICD-10-CM | POA: Diagnosis not present

## 2023-04-09 DIAGNOSIS — D509 Iron deficiency anemia, unspecified: Secondary | ICD-10-CM | POA: Diagnosis not present

## 2023-04-09 DIAGNOSIS — D631 Anemia in chronic kidney disease: Secondary | ICD-10-CM | POA: Diagnosis not present

## 2023-04-09 DIAGNOSIS — N186 End stage renal disease: Secondary | ICD-10-CM | POA: Diagnosis not present

## 2023-04-09 DIAGNOSIS — N2581 Secondary hyperparathyroidism of renal origin: Secondary | ICD-10-CM | POA: Diagnosis not present

## 2023-04-09 DIAGNOSIS — Z992 Dependence on renal dialysis: Secondary | ICD-10-CM | POA: Diagnosis not present

## 2023-04-09 DIAGNOSIS — N250 Renal osteodystrophy: Secondary | ICD-10-CM | POA: Diagnosis not present

## 2023-04-11 DIAGNOSIS — Z992 Dependence on renal dialysis: Secondary | ICD-10-CM | POA: Diagnosis not present

## 2023-04-11 DIAGNOSIS — N186 End stage renal disease: Secondary | ICD-10-CM | POA: Diagnosis not present

## 2023-04-11 DIAGNOSIS — N250 Renal osteodystrophy: Secondary | ICD-10-CM | POA: Diagnosis not present

## 2023-04-11 DIAGNOSIS — N2581 Secondary hyperparathyroidism of renal origin: Secondary | ICD-10-CM | POA: Diagnosis not present

## 2023-04-11 DIAGNOSIS — D631 Anemia in chronic kidney disease: Secondary | ICD-10-CM | POA: Diagnosis not present

## 2023-04-11 DIAGNOSIS — D509 Iron deficiency anemia, unspecified: Secondary | ICD-10-CM | POA: Diagnosis not present

## 2023-04-14 DIAGNOSIS — D509 Iron deficiency anemia, unspecified: Secondary | ICD-10-CM | POA: Diagnosis not present

## 2023-04-14 DIAGNOSIS — N186 End stage renal disease: Secondary | ICD-10-CM | POA: Diagnosis not present

## 2023-04-14 DIAGNOSIS — N250 Renal osteodystrophy: Secondary | ICD-10-CM | POA: Diagnosis not present

## 2023-04-14 DIAGNOSIS — N2581 Secondary hyperparathyroidism of renal origin: Secondary | ICD-10-CM | POA: Diagnosis not present

## 2023-04-14 DIAGNOSIS — Z992 Dependence on renal dialysis: Secondary | ICD-10-CM | POA: Diagnosis not present

## 2023-04-14 DIAGNOSIS — D631 Anemia in chronic kidney disease: Secondary | ICD-10-CM | POA: Diagnosis not present

## 2023-04-16 DIAGNOSIS — N186 End stage renal disease: Secondary | ICD-10-CM | POA: Diagnosis not present

## 2023-04-16 DIAGNOSIS — Z992 Dependence on renal dialysis: Secondary | ICD-10-CM | POA: Diagnosis not present

## 2023-04-16 DIAGNOSIS — D631 Anemia in chronic kidney disease: Secondary | ICD-10-CM | POA: Diagnosis not present

## 2023-04-16 DIAGNOSIS — D509 Iron deficiency anemia, unspecified: Secondary | ICD-10-CM | POA: Diagnosis not present

## 2023-04-16 DIAGNOSIS — N250 Renal osteodystrophy: Secondary | ICD-10-CM | POA: Diagnosis not present

## 2023-04-16 DIAGNOSIS — N2581 Secondary hyperparathyroidism of renal origin: Secondary | ICD-10-CM | POA: Diagnosis not present

## 2023-04-18 DIAGNOSIS — Z992 Dependence on renal dialysis: Secondary | ICD-10-CM | POA: Diagnosis not present

## 2023-04-18 DIAGNOSIS — D509 Iron deficiency anemia, unspecified: Secondary | ICD-10-CM | POA: Diagnosis not present

## 2023-04-18 DIAGNOSIS — N250 Renal osteodystrophy: Secondary | ICD-10-CM | POA: Diagnosis not present

## 2023-04-18 DIAGNOSIS — N186 End stage renal disease: Secondary | ICD-10-CM | POA: Diagnosis not present

## 2023-04-18 DIAGNOSIS — D631 Anemia in chronic kidney disease: Secondary | ICD-10-CM | POA: Diagnosis not present

## 2023-04-18 DIAGNOSIS — N2581 Secondary hyperparathyroidism of renal origin: Secondary | ICD-10-CM | POA: Diagnosis not present

## 2023-04-21 DIAGNOSIS — N2581 Secondary hyperparathyroidism of renal origin: Secondary | ICD-10-CM | POA: Diagnosis not present

## 2023-04-21 DIAGNOSIS — Z992 Dependence on renal dialysis: Secondary | ICD-10-CM | POA: Diagnosis not present

## 2023-04-21 DIAGNOSIS — D509 Iron deficiency anemia, unspecified: Secondary | ICD-10-CM | POA: Diagnosis not present

## 2023-04-21 DIAGNOSIS — N186 End stage renal disease: Secondary | ICD-10-CM | POA: Diagnosis not present

## 2023-04-21 DIAGNOSIS — N250 Renal osteodystrophy: Secondary | ICD-10-CM | POA: Diagnosis not present

## 2023-04-21 DIAGNOSIS — D631 Anemia in chronic kidney disease: Secondary | ICD-10-CM | POA: Diagnosis not present

## 2023-04-23 DIAGNOSIS — N186 End stage renal disease: Secondary | ICD-10-CM | POA: Diagnosis not present

## 2023-04-23 DIAGNOSIS — D631 Anemia in chronic kidney disease: Secondary | ICD-10-CM | POA: Diagnosis not present

## 2023-04-23 DIAGNOSIS — N2581 Secondary hyperparathyroidism of renal origin: Secondary | ICD-10-CM | POA: Diagnosis not present

## 2023-04-23 DIAGNOSIS — Z992 Dependence on renal dialysis: Secondary | ICD-10-CM | POA: Diagnosis not present

## 2023-04-23 DIAGNOSIS — N250 Renal osteodystrophy: Secondary | ICD-10-CM | POA: Diagnosis not present

## 2023-04-23 DIAGNOSIS — D509 Iron deficiency anemia, unspecified: Secondary | ICD-10-CM | POA: Diagnosis not present

## 2023-04-24 ENCOUNTER — Telehealth (HOSPITAL_COMMUNITY): Payer: Self-pay | Admitting: *Deleted

## 2023-04-24 NOTE — Telephone Encounter (Signed)
Received fax from [facility] requesting fistulogram due to high venous pressures, difficulty cannulating x 4 treatments. Will give to [facility].

## 2023-04-25 DIAGNOSIS — N2581 Secondary hyperparathyroidism of renal origin: Secondary | ICD-10-CM | POA: Diagnosis not present

## 2023-04-25 DIAGNOSIS — N250 Renal osteodystrophy: Secondary | ICD-10-CM | POA: Diagnosis not present

## 2023-04-25 DIAGNOSIS — D509 Iron deficiency anemia, unspecified: Secondary | ICD-10-CM | POA: Diagnosis not present

## 2023-04-25 DIAGNOSIS — Z992 Dependence on renal dialysis: Secondary | ICD-10-CM | POA: Diagnosis not present

## 2023-04-25 DIAGNOSIS — D631 Anemia in chronic kidney disease: Secondary | ICD-10-CM | POA: Diagnosis not present

## 2023-04-25 DIAGNOSIS — N186 End stage renal disease: Secondary | ICD-10-CM | POA: Diagnosis not present

## 2023-04-26 DIAGNOSIS — Z992 Dependence on renal dialysis: Secondary | ICD-10-CM | POA: Diagnosis not present

## 2023-04-26 DIAGNOSIS — N186 End stage renal disease: Secondary | ICD-10-CM | POA: Diagnosis not present

## 2023-04-27 ENCOUNTER — Non-Acute Institutional Stay (SKILLED_NURSING_FACILITY): Payer: Medicare Other | Admitting: Adult Health

## 2023-04-27 ENCOUNTER — Encounter: Payer: Self-pay | Admitting: Adult Health

## 2023-04-27 DIAGNOSIS — E1122 Type 2 diabetes mellitus with diabetic chronic kidney disease: Secondary | ICD-10-CM

## 2023-04-27 DIAGNOSIS — Z992 Dependence on renal dialysis: Secondary | ICD-10-CM

## 2023-04-27 DIAGNOSIS — N186 End stage renal disease: Secondary | ICD-10-CM

## 2023-04-27 DIAGNOSIS — D696 Thrombocytopenia, unspecified: Secondary | ICD-10-CM

## 2023-04-27 DIAGNOSIS — I700 Atherosclerosis of aorta: Secondary | ICD-10-CM | POA: Diagnosis not present

## 2023-04-27 DIAGNOSIS — E034 Atrophy of thyroid (acquired): Secondary | ICD-10-CM | POA: Diagnosis not present

## 2023-04-27 NOTE — Progress Notes (Signed)
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 There are no reports of uncontrolled pain. His weight is stable; he does get out of bed daily.   Past Medical History:  Diagnosis Date   Abnormal CT scan, kidney 10/06/2011   Acute pyelonephritis 10/07/2011   Anemia    normocytic   Anxiety    mental retardation   Bladder wall thickening 10/06/2011   BPH (benign prostatic hypertrophy)    Diabetes mellitus    Dialysis patient (HCC)    Tuesday, Thursday and Saturday,    DVT of leg (deep venous thrombosis) (HCC) 12/25/2016   Edema     history of lower extremity edema   GERD (gastroesophageal reflux disease)    Heme positive stool    Hydronephrosis    Hyperkalemia    Hyperlipidemia    Hypernatremia    Hypertension    Hypothyroidism    Impaired speech    Infected prosthetic vascular graft (HCC)    MR (mental retardation)    Muscle weakness    Obstructive uropathy    Perinephric abscess 10/07/2011   Poor historian poor historian   Primary colorectal adenocarcinoma (HCC) 04/2017   S/p total colectomy   Protein calorie malnutrition (HCC)    Pyelonephritis    Renal failure (ARF), acute on chronic (HCC)    Renal insufficiency    chronic history   Sepsis (HCC)    Smoking    Uremia    Urinary  retention    UTI (lower urinary tract infection) 10/06/2011    Past Surgical History:  Procedure Laterality Date   A/V FISTULAGRAM N/A 08/13/2018   Procedure: A/V FISTULAGRAM - Right Upper;  Surgeon: Sherren Kerns, MD;  Location: [facility] INVASIVE CV LAB;  Service: Cardiovascular;  Laterality: N/A;   A/V FISTULAGRAM N/A 11/22/2018   Procedure: A/V FISTULAGRAM - Right Upper;  Surgeon: Maeola Harman, MD;  Location: [facility] INVASIVE CV LAB;  Service: Cardiovascular;  Laterality: N/A;   A/V FISTULAGRAM Left 10/08/2022   Procedure: A/V Fistulagram;  Surgeon: Victorino Sparrow, MD;  Location: [facility] INVASIVE CV LAB;  Service: Cardiovascular;  Laterality: Left;   AV FISTULA PLACEMENT Left 07/06/2015   Procedure:  INSERTION LEFT ARM ARTERIOVENOUS GORTEX GRAFT;  Surgeon: Chuck Hint, MD;  Location: [facility] OR;  Service: Vascular;  Laterality: Left;   AV FISTULA PLACEMENT Right 02/26/2016   Procedure: ARTERIOVENOUS (AV) FISTULA CREATION ;  Surgeon: Chuck Hint, MD;  Location: [facility] OR;  Service: Vascular;  Laterality: Right;   AV FISTULA PLACEMENT Right 11/25/2018   Procedure: INSERTION OF ARTERIOVENOUS (AV) ARTEGRAFT RIGHT UPPER ARM;  Surgeon: Maeola Harman, MD;  Location: [facility] OR;  Service: Vascular;  Laterality: Right;   AV  FISTULA PLACEMENT Left 05/28/2021   Procedure: LEFT ARM ARTERIOVENOUS (AV) FISTULA;  Surgeon: Larina Earthly, MD;  Location: [facility] ORS;  Service: Vascular;  Laterality: Left;   AVGG REMOVAL Left 10/09/2015   Procedure: REMOVAL OF ARTERIOVENOUS GORETEX GRAFT (AVGG) Evacuation of Lymphocele, Vein Patch angioplasty of brachial artery.;  Surgeon: Chuck Hint, MD;  Location: [facility] OR;  Service: Vascular;  Laterality: Left;   BASCILIC VEIN TRANSPOSITION Right 02/26/2016   Procedure: Right BASCILIC VEIN TRANSPOSITION;  Surgeon: Chuck Hint, MD;  Location: [facility] OR;  Service: Vascular;  Laterality: Right;   BASCILIC VEIN TRANSPOSITION Left 07/30/2021    Procedure: LEFT ARM SECOND STAGE BASILIC VEIN TRANSPOSITION;  Surgeon: Larina Earthly, MD;  Location: [facility] ORS;  Service: Vascular;  Laterality: Left;   CIRCUMCISION N/A 01/04/2014   Procedure: CIRCUMCISION ADULT (procedure #1);  Surgeon: Ky Barban, MD;  Location: [facility] ORS;  Service: Urology;  Laterality: N/A;   COLECTOMY N/A 05/04/2017   Procedure: TOTAL COLECTOMY;  Surgeon: Franky Macho, MD;  Location: [facility] ORS;  Service: General;  Laterality: N/A;   COLONOSCOPY N/A 04/27/2017   Surgeon: Corbin Ade, MD; annular mass in the ascending colon likely representing cancer biopsied, multiple 6-22 mm polyps removed, clean rectum.  Pathology with multiple tubular adenomas, high-grade dysplasia noted in ascending colon and splenic flexure.   CYSTOSCOPY W/ RETROGRADES Bilateral 06/29/2015   Procedure: CYSTOSCOPY, DILATION OF URETHRAL STRICTURE WITH BILATERAL RETROGRADE PYELOGRAM,SUPRAPUBIC TUBE CHANGE;  Surgeon: Jerilee Field, MD;  Location: [facility] ORS;  Service: Urology;  Laterality: Bilateral;   CYSTOSCOPY WITH URETHRAL DILATATION N/A 12/29/2013   Procedure: CYSTOSCOPY WITH URETHRAL DILATATION;  Surgeon: Ky Barban, MD;  Location: [facility] ORS;  Service: Urology;  Laterality: N/A;   ESOPHAGOGASTRODUODENOSCOPY N/A 04/27/2017   Procedure: ESOPHAGOGASTRODUODENOSCOPY (EGD);  Surgeon: Corbin Ade, MD;  [facility]: [facility];  Service: Endoscopy;  Laterality: N/A;   FLEXIBLE SIGMOIDOSCOPY N/A 12/02/2021   Procedure: FLEXIBLE SIGMOIDOSCOPY;  Surgeon: Lanelle Bal, DO;  Location: [facility];  Service: Endoscopy;  Laterality: N/A;  10:30am, dialysis patient   INSERTION OF DIALYSIS CATHETER Right 11/25/2018   Procedure: INSERTION OF DIALYSIS CATHETER RIGHT INTERNAL JUGULAR;  Surgeon: Maeola Harman, MD;  Location: [facility] OR;  Service: Vascular;  Laterality: Right;   IR AV DIALY SHUNT INTRO NEEDLE/INTRACATH INITIAL W/PTA/IMG RIGHT Right 09/07/2018   IR FLUORO GUIDE CV LINE RIGHT  10/16/2020    IR FLUORO GUIDE CV LINE RIGHT  10/03/2022   IR REMOVAL TUN CV CATH W/O FL  01/12/2019   IR THROMBECTOMY AV FISTULA W/THROMBOLYSIS/PTA INC/SHUNT/IMG RIGHT Right 04/26/2018   IR US GUIDE VASC ACCESS RIGHT  04/26/2018   IR US GUIDE VASC ACCESS RIGHT  09/07/2018   IR US GUIDE VASC ACCESS RIGHT  10/16/2020   IR US GUIDE VASC ACCESS RIGHT  10/16/2020   IR US GUIDE VASC ACCESS RIGHT  10/03/2022   ORIF FEMUR FRACTURE Right 11/22/2016   Procedure: OPEN REDUCTION INTERNAL FIXATION (ORIF) DISTAL FEMUR FRACTURE;  Surgeon: Samson Frederic, MD;  Location: [facility] OR;  Service: Orthopedics;  Laterality: Right;   PATCH ANGIOPLASTY Right 12/10/2017   Procedure: PATCH ANGIOPLASTY;  Surgeon: Chuck Hint, MD;  Location: [facility] OR;  Service: Vascular;  Laterality: Right;   PERIPHERAL VASCULAR BALLOON ANGIOPLASTY  08/13/2018   Procedure: PERIPHERAL VASCULAR BALLOON ANGIOPLASTY;  Surgeon: Sherren Kerns, MD;  Location: [facility] INVASIVE CV LAB;  Service: Cardiovascular;;  right AV fistula    PERIPHERAL VASCULAR BALLOON ANGIOPLASTY  11/22/2018  Procedure: PERIPHERAL VASCULAR BALLOON ANGIOPLASTY;  Surgeon: Maeola Harman, MD;  Location: [facility] INVASIVE CV LAB;  Service: Cardiovascular;;  rt AV fistula   PERIPHERAL VASCULAR BALLOON ANGIOPLASTY  10/08/2022   Procedure: PERIPHERAL VASCULAR BALLOON ANGIOPLASTY;  Surgeon: Victorino Sparrow, MD;  Location: [facility] INVASIVE CV LAB;  Service: Cardiovascular;;  arterial anastamosis   PERIPHERAL VASCULAR CATHETERIZATION N/A 10/08/2015   Procedure: A/V Shuntogram;  Surgeon: Chuck Hint, MD;  Location: [facility] INVASIVE CV LAB;  Service: Cardiovascular;  Laterality: N/A;   PERIPHERAL VASCULAR INTERVENTION Left 10/08/2022   Procedure: PERIPHERAL VASCULAR INTERVENTION;  Surgeon: Victorino Sparrow, MD;  Location: [facility] INVASIVE CV LAB;  Service: Cardiovascular;  Laterality: Left;  left AVF   REMOVAL OF A DIALYSIS CATHETER N/A 12/03/2021   Procedure: MINOR REMOVAL OF A TUNNELED  DIALYSIS CATHETER;  Surgeon: Larina Earthly, MD;  Location: [facility] ORS;  Service: Vascular;  Laterality: N/A;   REMOVAL OF A DIALYSIS CATHETER N/A 11/18/2022   Procedure: MINOR REMOVAL OF A TUNNELED DIALYSIS CATHETER;  Surgeon: Larina Earthly, MD;  Location: [facility]S;  Service: Vascular;  Laterality: N/A;   THROMBECTOMY W/ EMBOLECTOMY Right 12/10/2017   Procedure: THROMBECTOMY REVISION RIGHT ARM  ARTERIOVENOUS FISTULA;  Surgeon: Chuck Hint, MD;  Location: [facility] OR;  Service: Vascular;  Laterality: Right;   TRANSURETHRAL RESECTION OF PROSTATE N/A 01/04/2014   Procedure: TRANSURETHRAL RESECTION OF THE PROSTATE (TURP) (procedure #2);  Surgeon: Ky Barban, MD;  Location: [facility]S;  Service: Urology;  Laterality: N/A;    Social History   Socioeconomic History   Marital status: Single    Spouse name: Not on file   Number of children: Not on file   Years of education: Not on file   Highest education level: Not on file  Occupational History   Occupation: retired   Tobacco Use   Smoking status: Never   Smokeless tobacco: Never  Vaping Use   Vaping Use: Never used  Substance and Sexual Activity   Alcohol use: No   Drug use: No   Sexual activity: Not Currently  Other Topics Concern   Not on file  Social History Narrative   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   Long term resident of [facility]    Social Determinants of Health   Financial Resource Strain: Low Risk  (02/18/2018)   Overall Financial Resource Strain (CARDIA)    Difficulty of Paying Living Expenses: Not hard at all  Food Insecurity: No Food Insecurity (02/18/2018)   Hunger Vital Sign    Worried About Running Out of Food in the Last Year: Never true    Ran Out of Food in the Last Year: Never true  Transportation Needs: No Transportation Needs (02/18/2018)   PRAPARE - Administrator, Civil Service (Medical): No    Lack of Transportation (Non-Medical): No  Physical Activity:  Unknown (09/15/2019)   Exercise Vital Sign    Days of Exercise per Week: Not on file    Minutes of Exercise per Session: 0 min  Stress: No Stress Concern Present (02/18/2018)   [employer] of Occupational Health - Occupational Stress Questionnaire    Feeling of Stress : Not at all  Social Connections: Unknown (09/15/2019)   Social Connection and Isolation Panel [NHANES]    Frequency of Communication with Friends and Family: Not on file    Frequency of Social Gatherings with Friends and Family: Never    Attends Religious Services: Not on file  Active Member of Clubs or Organizations: Not on file    Attends Club or Organization Meetings: Not on file    Marital Status: Not on file  Intimate Partner Violence: Not At Risk (02/18/2018)   Humiliation, Afraid, Rape, and Kick questionnaire    Fear of Current or Ex-Partner: No    Emotionally Abused: No    Physically Abused: No    Sexually Abused: No   Family History  Problem Relation Age of Onset   Cancer Mother    Colon cancer Neg Hx       VITAL SIGNS BP 110/68   Pulse 68   Temp (!) 97.4 F (36.3 C)   Resp 20   Ht 5\' 8"  (1.727 m)   Wt 196 lb 12.8 oz (89.3 kg)   SpO2 96%   BMI 29.92 kg/m   Outpatient Encounter Medications as of 04/27/2023  Medication Sig   Amino Acids-Protein Hydrolys (PRO-STAT AWC) LIQD 17-100 gram-kcal/30 mL; amt: 30 ml; oral Twice A Day   aspirin EC 81 MG tablet Take 81 mg by mouth daily. 0900   atorvastatin (LIPITOR) 20 MG tablet Take 20 mg by mouth at bedtime. 2000   Insulin Glargine (BASAGLAR KWIKPEN) 100 UNIT/ML Inject 8 Units into the skin at bedtime. 2100 DO NOT HOLD this insulin without provider notification   Insulin Pen Needle (BD AUTOSHIELD DUO) 30G X 5 MM MISC by Does not apply route. 3/16"   levothyroxine (SYNTHROID) 125 MCG tablet Take 125 mcg by mouth at bedtime. 2100   midodrine (PROAMATINE) 10 MG tablet Take 10 mg by mouth See admin instructions. Send med with resident on  Tues/Thurs/Sat  to dialysis. do not give at [facility], give to resident to take with him. send with 5mg  to equal 15mg . 6:00   midodrine (PROAMATINE) 5 MG tablet Take 5 mg by mouth See admin instructions. Send with resident on dialysis days.  Tues/Thurs/Sat  Take along with 10 mg to equal 15 mg 6:00   NON FORMULARY Diet: Regular diet   omega-3 acid ethyl esters (LOVAZA) 1 g capsule Take 2 g by mouth at bedtime. 2100   omeprazole (PRILOSEC) 40 MG capsule Take 40 mg by mouth daily. 0900   ondansetron (ZOFRAN) 8 MG tablet Take 8 mg by mouth every 6 (six) hours as needed for nausea or vomiting.   polyethylene glycol (MIRALAX / GLYCOLAX) packet Take 17 g by mouth daily as needed for mild constipation.    sevelamer carbonate (RENVELA) 800 MG tablet Take 3 tablets (2,400 mg total) by mouth 3 (three) times daily with meals.   tamsulosin (FLOMAX) 0.4 MG CAPS capsule Take 0.4 mg by mouth daily at 6 PM. Give 30 minutes after a meal. Do not crush or chew   torsemide (DEMADEX) 20 MG tablet Take 20 mg by mouth as directed. Sunday, Monday, Wednesday, and Friday   vitamin B-12 (CYANOCOBALAMIN) 1000 MCG tablet Take 1,000 mcg by mouth daily.   No facility-administered encounter medications on file as of 04/27/2023.     SIGNIFICANT DIAGNOSTIC EXAMS  PREVIOUS   06-11-22: dexa: t score: -2.052  NO NEW EXAMS.    LABS REVIEWED PREVIOUS;      06-19-22: hgb a1c 5.8; tsh 2.539 free t4: 0.98 09-29-22: wbc 4.3; hgb 12.9; hct 41.1; mcv 108.4 plt 108; glucose 112; bun 44; creat 16.87; k+ 4.6; na++ 139; ca 9.3 gfr 3; protein 7.0; albumin 3.5; hgb A1c 5.0; tsh 5.861 chol 89; ldl 41; trig 68; hdl 35 10-08-22: glucose 101; bun  22; creat 12.30; k+ 5.2; na++ 141; ion ca 1.14;  02-13-23: wbc 3.8; hgb 9.7; hct 28.7; mcv 101.1 plt 126; glucose 67; bun 18; creat 6.28; k+ 2.9; na++ 137; ca 8.8; gfr 9; protein 6.9; albumin 3.2 mag 1.8; occult blood: neg 02-23-23: hgb 9.8; hct 29.7 glucose 110; bun 24; creat 8.32 k+ 3.6; na++ 139; ca 9.6 gfr 6    NO NEW LABS.     Review of Systems  Constitutional:  Negative for malaise/fatigue.  Respiratory:  Negative for cough and shortness of breath.   Cardiovascular:  Negative for chest pain, palpitations and leg swelling.  Gastrointestinal:  Negative for abdominal pain, constipation and heartburn.  Musculoskeletal:  Negative for back pain, joint pain and myalgias.  Skin: Negative.   Neurological:  Negative for dizziness.  Psychiatric/Behavioral:  The patient is not nervous/anxious.    Physical Exam Constitutional:      General: He is not in acute distress.    Appearance: He is well-developed. He is not diaphoretic.  Neck:     Thyroid: No thyromegaly.  Cardiovascular:     Rate and Rhythm: Normal rate and regular rhythm.     Pulses: Normal pulses.     Heart sounds: Normal heart sounds.  Pulmonary:     Effort: Pulmonary effort is normal. No respiratory distress.     Breath sounds: Normal breath sounds.  Abdominal:     General: Bowel sounds are normal. There is no distension.     Palpations: Abdomen is soft.     Tenderness: There is no abdominal tenderness.  Musculoskeletal:        General: Normal range of motion.     Cervical back: Neck supple.     Right lower leg: No edema.     Left lower leg: No edema.  Lymphadenopathy:     Cervical: No cervical adenopathy.  Skin:    General: Skin is warm and dry.     Comments: Left arm fistula +thrill+bruit       Neurological:     Mental Status: He is alert. Mental status is at baseline.     Comments: Stutters   Psychiatric:        Mood and Affect: Mood normal.     ASSESSMENT/ PLAN:  TODAY  Thrombocytopenia: plt 108  2. Aortic atherosclerosis (ct 12-30-21) is on asa and statin  3. 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: is on dialysis 3 days weekly; 1200 cc fluid restriction (does not adhere) midodrine 15 mg with dialysis; renvela 2400 mg three times daily   4.  Hypothyroidism due to acquired atrophy of thyroid  tsh 5.861 will continue synthroid 125 mcg daily    PREVIOUS   5. 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 Major depression single episode moderate: is presently off medications.   7. Osteopenia: t score -2.052 on supplements  8. GERD without esophagitis: will continue prilosec 40 mg daily   9. History of colon cancder: s/p colectomy last seen by GI on 12-19-21; ct scan done 12-30-21  10. Dyslipidemia associate with type 2 diabetes mellitus: ldl 41; trig 63; will continue lipitor 20 mg daily lovaza 2 gm daily   11. Vitamin B 12 deficiency: is on daily supplement  12. Secondary hyperparathyroidism of renal origin: will monitor   13. Bilateral lower extremity edema: will continue demadex 20 mg four times weekly  14. Type 2 diabetes mellitus with hypertension and end stage  renal disease on hemodialysis: hgb A1c 5.0; will continue basaglar 8 units nightly is on asa and statin   Synthia Innocent NP [facility]  call [phone]

## 2023-04-28 ENCOUNTER — Other Ambulatory Visit: Payer: Self-pay

## 2023-04-28 DIAGNOSIS — N2581 Secondary hyperparathyroidism of renal origin: Secondary | ICD-10-CM | POA: Diagnosis not present

## 2023-04-28 DIAGNOSIS — D631 Anemia in chronic kidney disease: Secondary | ICD-10-CM | POA: Diagnosis not present

## 2023-04-28 DIAGNOSIS — Z992 Dependence on renal dialysis: Secondary | ICD-10-CM | POA: Diagnosis not present

## 2023-04-28 DIAGNOSIS — N186 End stage renal disease: Secondary | ICD-10-CM

## 2023-04-28 DIAGNOSIS — T82590D Other mechanical complication of surgically created arteriovenous fistula, subsequent encounter: Secondary | ICD-10-CM

## 2023-04-28 DIAGNOSIS — N250 Renal osteodystrophy: Secondary | ICD-10-CM | POA: Diagnosis not present

## 2023-04-28 DIAGNOSIS — D509 Iron deficiency anemia, unspecified: Secondary | ICD-10-CM | POA: Diagnosis not present

## 2023-04-28 MED ORDER — SODIUM CHLORIDE 0.9% FLUSH: 3.0000 mL | Freq: Two times a day (BID) | INTRAVENOUS | Status: DC

## 2023-04-28 MED ORDER — SODIUM CHLORIDE 0.9 % IV SOLN: 250.0000 mL | INTRAVENOUS | Status: DC | PRN

## 2023-04-28 MED ORDER — SODIUM CHLORIDE 0.9% FLUSH: 3.0000 mL | INTRAVENOUS | Status: DC | PRN

## 2023-04-29 DIAGNOSIS — F4322 Adjustment disorder with anxiety: Secondary | ICD-10-CM | POA: Diagnosis not present

## 2023-04-29 DIAGNOSIS — F70 Mild intellectual disabilities: Secondary | ICD-10-CM | POA: Diagnosis not present

## 2023-04-30 DIAGNOSIS — D509 Iron deficiency anemia, unspecified: Secondary | ICD-10-CM | POA: Diagnosis not present

## 2023-04-30 DIAGNOSIS — N2581 Secondary hyperparathyroidism of renal origin: Secondary | ICD-10-CM | POA: Diagnosis not present

## 2023-04-30 DIAGNOSIS — N186 End stage renal disease: Secondary | ICD-10-CM | POA: Diagnosis not present

## 2023-04-30 DIAGNOSIS — D631 Anemia in chronic kidney disease: Secondary | ICD-10-CM | POA: Diagnosis not present

## 2023-04-30 DIAGNOSIS — N250 Renal osteodystrophy: Secondary | ICD-10-CM | POA: Diagnosis not present

## 2023-04-30 DIAGNOSIS — Z992 Dependence on renal dialysis: Secondary | ICD-10-CM | POA: Diagnosis not present

## 2023-05-02 DIAGNOSIS — N186 End stage renal disease: Secondary | ICD-10-CM | POA: Diagnosis not present

## 2023-05-02 DIAGNOSIS — D631 Anemia in chronic kidney disease: Secondary | ICD-10-CM | POA: Diagnosis not present

## 2023-05-02 DIAGNOSIS — D509 Iron deficiency anemia, unspecified: Secondary | ICD-10-CM | POA: Diagnosis not present

## 2023-05-02 DIAGNOSIS — Z992 Dependence on renal dialysis: Secondary | ICD-10-CM | POA: Diagnosis not present

## 2023-05-02 DIAGNOSIS — N250 Renal osteodystrophy: Secondary | ICD-10-CM | POA: Diagnosis not present

## 2023-05-02 DIAGNOSIS — N2581 Secondary hyperparathyroidism of renal origin: Secondary | ICD-10-CM | POA: Diagnosis not present

## 2023-05-02 NOTE — Progress Notes (Unsigned)
Referring Provider: Sharee Holster, NP Primary Care Physician:  Sharee Holster, NP Primary GI Physician: Dr. Jena Gauss  No chief complaint on file.   HPI:   Zachary [name] is a 77 y.o. male who resides at the [facility] with GI history of GERD, multiple adenomatous colon polyps and invasive colorectal adenocarcinoma in 2018 s/p total colectomy with side-to-side ileorectal anastomosis in July 2018, pancreatic cystic lesions followed by CT (due March 2025), presenting today for follow-up***   Last seen in the office 02/23/2023.  Per review of nursing home note 02/12/2023, patient had 2 weeks of nausea/vomiting, more so at night after lying down, some blood in the vomit.  He had been sent to the emergency room due to hypokalemia.  He was noted to have a hemoglobin of 9.7 with macrocytic indices (previously in 12 range), platelets 126.  FOBT negative. Hgb remained stable at 9.8 on 4/29.   At the time of his office visit, he presented with his legal guardian who helps provide history.  Reported nausea/vomiting starting on 4/18 after dinner.  Had spit up some blood, but also reported sinus issues/respiratory illness at that time as well as some issues with his teeth and was not exactly sure where the blood came from.  No sores legal guardian new, patient was no longer vomiting.  Patient also denied nausea, vomiting, abdominal pain, heartburn/reflux, dysphagia.  I did note that he had been losing weight.  Stated he did not like the food at [facility].  Denied any lower GI symptoms.  Offered EGD, but patient was hesitant about this.  Legal guardian stated they would talk about this further and let me know their decision.  Recommended updating iron, B12, folate.  Otherwise, advised to continue omeprazole 40 mg daily, eat at least 75-100% of meals 3 times a day, 2 snacks daily, recommended nutritional supplements to be offered daily as well.  No labs received.   EGD was not scheduled.   Today:     Past Medical History:  Diagnosis Date   Abnormal CT scan, kidney 10/06/2011   Acute pyelonephritis 10/07/2011   Anemia    normocytic   Anxiety    mental retardation   Bladder wall thickening 10/06/2011   BPH (benign prostatic hypertrophy)    Diabetes mellitus    Dialysis patient (HCC)    Tuesday, Thursday and Saturday,    DVT of leg (deep venous thrombosis) (HCC) 12/25/2016   Edema     history of lower extremity edema   GERD (gastroesophageal reflux disease)    Heme positive stool    Hydronephrosis    Hyperkalemia    Hyperlipidemia    Hypernatremia    Hypertension    Hypothyroidism    Impaired speech    Infected prosthetic vascular graft (HCC)    MR (mental retardation)    Muscle weakness    Obstructive uropathy    Perinephric abscess 10/07/2011   Poor historian poor historian   Primary colorectal adenocarcinoma (HCC) 04/2017   S/p total colectomy   Protein calorie malnutrition (HCC)    Pyelonephritis    Renal failure (ARF), acute on chronic (HCC)    Renal insufficiency    chronic history   Sepsis (HCC)    Smoking    Uremia    Urinary retention    UTI (lower urinary tract infection) 10/06/2011    Past Surgical History:  Procedure Laterality Date   A/V FISTULAGRAM N/A 08/13/2018   Procedure: A/V FISTULAGRAM - Right  Upper;  Surgeon: Sherren Kerns, MD;  Location: [facility] INVASIVE CV LAB;  Service: Cardiovascular;  Laterality: N/A;   A/V FISTULAGRAM N/A 11/22/2018   Procedure: A/V FISTULAGRAM - Right Upper;  Surgeon: Maeola Harman, MD;  Location: [facility] INVASIVE CV LAB;  Service: Cardiovascular;  Laterality: N/A;   A/V FISTULAGRAM Left 10/08/2022   Procedure: A/V Fistulagram;  Surgeon: Victorino Sparrow, MD;  Location: [facility] INVASIVE CV LAB;  Service: Cardiovascular;  Laterality: Left;   AV FISTULA PLACEMENT Left 07/06/2015   Procedure:  INSERTION LEFT ARM ARTERIOVENOUS GORTEX GRAFT;  Surgeon: Chuck Hint, MD;  Location: [facility] OR;  Service: Vascular;   Laterality: Left;   AV FISTULA PLACEMENT Right 02/26/2016   Procedure: ARTERIOVENOUS (AV) FISTULA CREATION ;  Surgeon: Chuck Hint, MD;  Location: [facility] OR;  Service: Vascular;  Laterality: Right;   AV FISTULA PLACEMENT Right 11/25/2018   Procedure: INSERTION OF ARTERIOVENOUS (AV) ARTEGRAFT RIGHT UPPER ARM;  Surgeon: Maeola Harman, MD;  Location: [facility] OR;  Service: Vascular;  Laterality: Right;   AV FISTULA PLACEMENT Left 05/28/2021   Procedure: LEFT ARM ARTERIOVENOUS (AV) FISTULA;  Surgeon: Larina Earthly, MD;  Location: [facility] ORS;  Service: Vascular;  Laterality: Left;   AVGG REMOVAL Left 10/09/2015   Procedure: REMOVAL OF ARTERIOVENOUS GORETEX GRAFT (AVGG) Evacuation of Lymphocele, Vein Patch angioplasty of brachial artery.;  Surgeon: Chuck Hint, MD;  Location: [facility] OR;  Service: Vascular;  Laterality: Left;   BASCILIC VEIN TRANSPOSITION Right 02/26/2016   Procedure: Right BASCILIC VEIN TRANSPOSITION;  Surgeon: Chuck Hint, MD;  Location: [facility] OR;  Service: Vascular;  Laterality: Right;   BASCILIC VEIN TRANSPOSITION Left 07/30/2021   Procedure: LEFT ARM SECOND STAGE BASILIC VEIN TRANSPOSITION;  Surgeon: Larina Earthly, MD;  Location: [facility] ORS;  Service: Vascular;  Laterality: Left;   CIRCUMCISION N/A 01/04/2014   Procedure: CIRCUMCISION ADULT (procedure #1);  Surgeon: Ky Barban, MD;  Location: [facility] ORS;  Service: Urology;  Laterality: N/A;   COLECTOMY N/A 05/04/2017   Procedure: TOTAL COLECTOMY;  Surgeon: Franky Macho, MD;  Location: [facility] ORS;  Service: General;  Laterality: N/A;   COLONOSCOPY N/A 04/27/2017   Surgeon: Corbin Ade, MD; annular mass in the ascending colon likely representing cancer biopsied, multiple 6-22 mm polyps removed, clean rectum.  Pathology with multiple tubular adenomas, high-grade dysplasia noted in ascending colon and splenic flexure.   CYSTOSCOPY W/ RETROGRADES Bilateral 06/29/2015   Procedure: CYSTOSCOPY, DILATION OF URETHRAL  STRICTURE WITH BILATERAL RETROGRADE PYELOGRAM,SUPRAPUBIC TUBE CHANGE;  Surgeon: Jerilee Field, MD;  Location: [facility] ORS;  Service: Urology;  Laterality: Bilateral;   CYSTOSCOPY WITH URETHRAL DILATATION N/A 12/29/2013   Procedure: CYSTOSCOPY WITH URETHRAL DILATATION;  Surgeon: Ky Barban, MD;  Location: [facility];  Service: Urology;  Laterality: N/A;   ESOPHAGOGASTRODUODENOSCOPY N/A 04/27/2017   Procedure: ESOPHAGOGASTRODUODENOSCOPY (EGD);  Surgeon: Corbin Ade, MD;  [facility];  Service: Endoscopy;  Laterality: N/A;   FLEXIBLE SIGMOIDOSCOPY N/A 12/02/2021   Procedure: FLEXIBLE SIGMOIDOSCOPY;  Surgeon: Lanelle Bal, DO;  Location: [facility];  Service: Endoscopy;  Laterality: N/A;  10:30am, dialysis patient   INSERTION OF DIALYSIS CATHETER Right 11/25/2018   Procedure: INSERTION OF DIALYSIS CATHETER RIGHT INTERNAL JUGULAR;  Surgeon: Maeola Harman, MD;  Location: [facility] OR;  Service: Vascular;  Laterality: Right;   IR AV DIALY SHUNT INTRO NEEDLE/INTRACATH INITIAL W/PTA/IMG RIGHT Right 09/07/2018   IR FLUORO GUIDE CV LINE RIGHT  10/16/2020   IR FLUORO GUIDE CV  LINE RIGHT  10/03/2022   IR REMOVAL TUN CV CATH W/O FL  01/12/2019   IR THROMBECTOMY AV FISTULA W/THROMBOLYSIS/PTA INC/SHUNT/IMG RIGHT Right 04/26/2018   IR US GUIDE VASC ACCESS RIGHT  04/26/2018   IR US GUIDE VASC ACCESS RIGHT  09/07/2018   IR US GUIDE VASC ACCESS RIGHT  10/16/2020   IR US GUIDE VASC ACCESS RIGHT  10/16/2020   IR US GUIDE VASC ACCESS RIGHT  10/03/2022   ORIF FEMUR FRACTURE Right 11/22/2016   Procedure: OPEN REDUCTION INTERNAL FIXATION (ORIF) DISTAL FEMUR FRACTURE;  Surgeon: Samson Frederic, MD;  Location: [facility] OR;  Service: Orthopedics;  Laterality: Right;   PATCH ANGIOPLASTY Right 12/10/2017   Procedure: PATCH ANGIOPLASTY;  Surgeon: Chuck Hint, MD;  Location: [facility] OR;  Service: Vascular;  Laterality: Right;   PERIPHERAL VASCULAR BALLOON ANGIOPLASTY  08/13/2018   Procedure:  PERIPHERAL VASCULAR BALLOON ANGIOPLASTY;  Surgeon: Sherren Kerns, MD;  Location: [facility] INVASIVE CV LAB;  Service: Cardiovascular;;  right AV fistula    PERIPHERAL VASCULAR BALLOON ANGIOPLASTY  11/22/2018   Procedure: PERIPHERAL VASCULAR BALLOON ANGIOPLASTY;  Surgeon: Maeola Harman, MD;  Location: [facility] INVASIVE CV LAB;  Service: Cardiovascular;;  rt AV fistula   PERIPHERAL VASCULAR BALLOON ANGIOPLASTY  10/08/2022   Procedure: PERIPHERAL VASCULAR BALLOON ANGIOPLASTY;  Surgeon: Victorino Sparrow, MD;  Location: [facility] INVASIVE CV LAB;  Service: Cardiovascular;;  arterial anastamosis   PERIPHERAL VASCULAR CATHETERIZATION N/A 10/08/2015   Procedure: A/V Shuntogram;  Surgeon: Chuck Hint, MD;  Location: [facility] INVASIVE CV LAB;  Service: Cardiovascular;  Laterality: N/A;   PERIPHERAL VASCULAR INTERVENTION Left 10/08/2022   Procedure: PERIPHERAL VASCULAR INTERVENTION;  Surgeon: Victorino Sparrow, MD;  Location: [facility] INVASIVE CV LAB;  Service: Cardiovascular;  Laterality: Left;  left AVF   REMOVAL OF A DIALYSIS CATHETER N/A 12/03/2021   Procedure: MINOR REMOVAL OF A TUNNELED DIALYSIS CATHETER;  Surgeon: Larina Earthly, MD;  Location: [facility];  Service: Vascular;  Laterality: N/A;   REMOVAL OF A DIALYSIS CATHETER N/A 11/18/2022   Procedure: MINOR REMOVAL OF A TUNNELED DIALYSIS CATHETER;  Surgeon: Larina Earthly, MD;  Location: [facility];  Service: Vascular;  Laterality: N/A;   THROMBECTOMY W/ EMBOLECTOMY Right 12/10/2017   Procedure: THROMBECTOMY REVISION RIGHT ARM  ARTERIOVENOUS FISTULA;  Surgeon: Chuck Hint, MD;  Location: [facility] OR;  Service: Vascular;  Laterality: Right;   TRANSURETHRAL RESECTION OF PROSTATE N/A 01/04/2014   Procedure: TRANSURETHRAL RESECTION OF THE PROSTATE (TURP) (procedure #2);  Surgeon: Ky Barban, MD;  Location: [facility];  Service: Urology;  Laterality: N/A;    Current Outpatient Medications  Medication Sig Dispense Refill   aspirin EC 81 MG tablet Take 81 mg by  mouth daily. 0900     atorvastatin (LIPITOR) 20 MG tablet Take 20 mg by mouth at bedtime. 2200     Insulin Glargine (BASAGLAR KWIKPEN) 100 UNIT/ML Inject 8 Units into the skin at bedtime. 2200     Insulin Pen Needle (BD AUTOSHIELD DUO) 30G X 5 MM MISC by Does not apply route. 3/16"     levothyroxine (SYNTHROID) 125 MCG tablet Take 125 mcg by mouth at bedtime. 2200     midodrine (PROAMATINE) 10 MG tablet Take 10 mg by mouth See admin instructions. Send med with resident on  Tues/Thurs/Sat to dialysis. do not give at [facility], give to resident to take with him. send with 5mg  to equal 15mg . 6:00     midodrine (PROAMATINE) 5 MG tablet Take 5 mg by  mouth See admin instructions. Send with resident on dialysis days.  Tues/Thurs/Sat  Take along with 10 mg to equal 15 mg 6:00     NON FORMULARY Diet: Regular diet     Nutritional Supplements (,FEEDING SUPPLEMENT, PROSOURCE PLUS) liquid Take 30 mLs by mouth 2 (two) times daily between meals. 1600 and 2200     omega-3 acid ethyl esters (LOVAZA) 1 g capsule Take 2 g by mouth at bedtime. 2200     omeprazole (PRILOSEC) 40 MG capsule Take 40 mg by mouth at bedtime. 2200     polyethylene glycol (MIRALAX / GLYCOLAX) packet Take 17 g by mouth daily as needed for mild constipation.      sevelamer carbonate (RENVELA) 800 MG tablet Take 3 tablets (2,400 mg total) by mouth 3 (three) times daily with meals.     tamsulosin (FLOMAX) 0.4 MG CAPS capsule Take 0.4 mg by mouth at bedtime. Give 30 minutes after a meal. Do not crush or chew     torsemide (DEMADEX) 20 MG tablet Take 20 mg by mouth See admin instructions. 20mg  daily at 2000 on Sunday, Monday, Wednesday, and Friday only.     vitamin B-12 (CYANOCOBALAMIN) 1000 MCG tablet Take 1,000 mcg by mouth at bedtime.     No current facility-administered medications for this visit.    Allergies as of 05/04/2023   (No Known Allergies)    Family History  Problem Relation Age of Onset   Cancer Mother    Colon cancer Neg  Hx     Social History   Socioeconomic History   Marital status: Single    Spouse name: Not on file   Number of children: Not on file   Years of education: Not on file   Highest education level: Not on file  Occupational History   Occupation: retired   Tobacco Use   Smoking status: Never   Smokeless tobacco: Never  Vaping Use   Vaping Use: Never used  Substance and Sexual Activity   Alcohol use: No   Drug use: No   Sexual activity: Not Currently  Other Topics Concern   Not on file  Social History Narrative   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   [facility] resident of [facility]    Social Determinants of Health   Financial Resource Strain: Low Risk  (02/18/2018)   Overall Financial Resource Strain (CARDIA)    Difficulty of Paying Living Expenses: Not hard at all  Food Insecurity: No Food Insecurity (02/18/2018)   Hunger Vital Sign    Worried About Running Out of Food in the Last Year: Never true    Ran Out of Food in the Last Year: Never true  Transportation Needs: No Transportation Needs (02/18/2018)   PRAPARE - Administrator, Civil Service (Medical): No    Lack of Transportation (Non-Medical): No  Physical Activity: Unknown (09/15/2019)   Exercise Vital Sign    Days of Exercise per Week: Not on file    Minutes of Exercise per Session: 0 min  Stress: No Stress Concern Present (02/18/2018)   [employer] of Occupational Health - Occupational Stress Questionnaire    Feeling of Stress : Not at all  Social Connections: Unknown (09/15/2019)   Social Connection and Isolation Panel [NHANES]    Frequency of Communication with Friends and Family: Not on file    Frequency of Social Gatherings with Friends and Family: Never    Attends Religious Services: Not on file    Active  Member of Clubs or Organizations: Not on file    Attends Banker Meetings: Not on file    Marital Status: Not on file    Review of  Systems: Gen: Denies fever, chills, anorexia. Denies fatigue, weakness, weight loss.  CV: Denies chest pain, palpitations, syncope, peripheral edema, and claudication. Resp: Denies dyspnea at rest, cough, wheezing, coughing up blood, and pleurisy. GI: Denies vomiting blood, jaundice, and fecal incontinence.   Denies dysphagia or odynophagia. Derm: Denies rash, itching, dry skin Psych: Denies depression, anxiety, memory loss, confusion. No homicidal or suicidal ideation.  Heme: Denies bruising, bleeding, and enlarged lymph nodes.  Physical Exam: There were no vitals taken for this visit. General:   Alert and oriented. No distress noted. Pleasant and cooperative.  Head:  Normocephalic and atraumatic. Eyes:  Conjuctiva clear without scleral icterus. Heart:  S1, S2 present without murmurs appreciated. Lungs:  Clear to auscultation bilaterally. No wheezes, rales, or rhonchi. No distress.  Abdomen:  +BS, soft, non-tender and non-distended. No rebound or guarding. No HSM or masses noted. Msk:  Symmetrical without gross deformities. Normal posture. Extremities:  Without edema. Neurologic:  Alert and  oriented x4 Psych:  Normal mood and affect.    Assessment:     Plan:  ***   Ermalinda Memos, PA-C [facility] 05/04/2023

## 2023-05-04 ENCOUNTER — Ambulatory Visit (INDEPENDENT_AMBULATORY_CARE_PROVIDER_SITE_OTHER): Payer: Medicare Other | Admitting: Gastroenterology

## 2023-05-04 ENCOUNTER — Encounter: Payer: Self-pay | Admitting: Gastroenterology

## 2023-05-04 VITALS — BP 136/68 | HR 80 | Temp 97.9°F | Wt 199.4 lb

## 2023-05-04 DIAGNOSIS — R112 Nausea with vomiting, unspecified: Secondary | ICD-10-CM | POA: Diagnosis not present

## 2023-05-04 DIAGNOSIS — K920 Hematemesis: Secondary | ICD-10-CM | POA: Diagnosis not present

## 2023-05-04 NOTE — Patient Instructions (Addendum)
As you have not had any recurrent nausea or vomiting and currently have no other significant GI symptoms, we will plan to follow-up with you as needed.  It was great to see you again today!  Ermalinda Memos, PA-C [facility]

## 2023-05-05 DIAGNOSIS — D631 Anemia in chronic kidney disease: Secondary | ICD-10-CM | POA: Diagnosis not present

## 2023-05-05 DIAGNOSIS — N250 Renal osteodystrophy: Secondary | ICD-10-CM | POA: Diagnosis not present

## 2023-05-05 DIAGNOSIS — Z992 Dependence on renal dialysis: Secondary | ICD-10-CM | POA: Diagnosis not present

## 2023-05-05 DIAGNOSIS — D509 Iron deficiency anemia, unspecified: Secondary | ICD-10-CM | POA: Diagnosis not present

## 2023-05-05 DIAGNOSIS — N186 End stage renal disease: Secondary | ICD-10-CM | POA: Diagnosis not present

## 2023-05-05 DIAGNOSIS — N2581 Secondary hyperparathyroidism of renal origin: Secondary | ICD-10-CM | POA: Diagnosis not present

## 2023-05-06 ENCOUNTER — Encounter (HOSPITAL_COMMUNITY): Payer: Self-pay | Admitting: Vascular Surgery

## 2023-05-06 ENCOUNTER — Other Ambulatory Visit: Payer: Self-pay

## 2023-05-06 ENCOUNTER — Encounter (HOSPITAL_COMMUNITY)
Admission: RE | Disposition: A | Discharge status: Skilled Nursing Facility | Payer: Self-pay | Source: Home / Self Care | Attending: Vascular Surgery

## 2023-05-06 ENCOUNTER — Ambulatory Visit (HOSPITAL_COMMUNITY)
Admission: RE | Admit: 2023-05-06 | Discharge: 2023-05-06 | Disposition: A | Discharge status: Skilled Nursing Facility | Payer: Medicare Other | Attending: Vascular Surgery | Admitting: Vascular Surgery

## 2023-05-06 DIAGNOSIS — I120 Hypertensive chronic kidney disease with stage 5 chronic kidney disease or end stage renal disease: Secondary | ICD-10-CM | POA: Diagnosis not present

## 2023-05-06 DIAGNOSIS — Y841 Kidney dialysis as the cause of abnormal reaction of the patient, or of later complication, without mention of misadventure at the time of the procedure: Secondary | ICD-10-CM | POA: Insufficient documentation

## 2023-05-06 DIAGNOSIS — E1122 Type 2 diabetes mellitus with diabetic chronic kidney disease: Secondary | ICD-10-CM | POA: Insufficient documentation

## 2023-05-06 DIAGNOSIS — T82590D Other mechanical complication of surgically created arteriovenous fistula, subsequent encounter: Secondary | ICD-10-CM

## 2023-05-06 DIAGNOSIS — N186 End stage renal disease: Secondary | ICD-10-CM | POA: Insufficient documentation

## 2023-05-06 DIAGNOSIS — T82898A Other specified complication of vascular prosthetic devices, implants and grafts, initial encounter: Secondary | ICD-10-CM | POA: Insufficient documentation

## 2023-05-06 DIAGNOSIS — Z992 Dependence on renal dialysis: Secondary | ICD-10-CM | POA: Diagnosis not present

## 2023-05-06 HISTORY — PX: A/V FISTULAGRAM: CATH118298

## 2023-05-06 LAB — GLUCOSE, CAPILLARY
Glucose-Capillary: 63 mg/dL — ABNORMAL LOW (ref 70–99)
Glucose-Capillary: 90 mg/dL (ref 70–99)

## 2023-05-06 LAB — POCT I-STAT, CHEM 8
BUN: 44 mg/dL — ABNORMAL HIGH (ref 8–23)
Calcium, Ion: 1.29 mmol/L (ref 1.15–1.40)
Chloride: 104 mmol/L (ref 98–111)
Creatinine, Ser: 8.5 mg/dL — ABNORMAL HIGH (ref 0.61–1.24)
Glucose, Bld: 93 mg/dL (ref 70–99)
HCT: 38 % — ABNORMAL LOW (ref 39.0–52.0)
Hemoglobin: 12.9 g/dL — ABNORMAL LOW (ref 13.0–17.0)
Potassium: 5.1 mmol/L (ref 3.5–5.1)
Sodium: 140 mmol/L (ref 135–145)
TCO2: 26 mmol/L (ref 22–32)

## 2023-05-06 SURGERY — A/V FISTULAGRAM
Anesthesia: LOCAL | Laterality: Left

## 2023-05-06 MED ORDER — MIDAZOLAM HCL 2 MG/2ML IJ SOLN
INTRAMUSCULAR | Status: AC
  Filled 2023-05-06: qty 2

## 2023-05-06 MED ORDER — FENTANYL CITRATE (PF) 100 MCG/2ML IJ SOLN
INTRAMUSCULAR | Status: AC
  Filled 2023-05-06: qty 2

## 2023-05-06 MED ORDER — IODIXANOL 320 MG/ML IV SOLN
INTRAVENOUS | Status: DC | PRN
  Administered 2023-05-06: 30 mL

## 2023-05-06 MED ORDER — MIDAZOLAM HCL 2 MG/2ML IJ SOLN
INTRAMUSCULAR | Status: DC | PRN
  Administered 2023-05-06: 1 mg via INTRAVENOUS

## 2023-05-06 MED ORDER — LIDOCAINE HCL (PF) 1 % IJ SOLN
INTRAMUSCULAR | Status: DC | PRN
  Administered 2023-05-06: 5 mL

## 2023-05-06 MED ORDER — FENTANYL CITRATE (PF) 100 MCG/2ML IJ SOLN
INTRAMUSCULAR | Status: DC | PRN
  Administered 2023-05-06: 25 ug via INTRAVENOUS

## 2023-05-06 MED ORDER — HEPARIN (PORCINE) IN NACL 1000-0.9 UT/500ML-% IV SOLN
INTRAVENOUS | Status: DC | PRN
  Administered 2023-05-06: 500 mL

## 2023-05-06 MED ORDER — LIDOCAINE HCL (PF) 1 % IJ SOLN
INTRAMUSCULAR | Status: AC
  Filled 2023-05-06: qty 30

## 2023-05-06 SURGICAL SUPPLY — 11 items
BAG SNAP BAND KOVER 36X36 (MISCELLANEOUS) ×1 IMPLANT
COVER DOME SNAP 22 D (MISCELLANEOUS) ×1 IMPLANT
DEVICE RAD COMP TR BAND LRG (VASCULAR PRODUCTS) IMPLANT
KIT MICROPUNCTURE NIT STIFF (SHEATH) IMPLANT
PROTECTION STATION PRESSURIZED (MISCELLANEOUS) ×1
SHEATH GLIDE SLENDER 4/5FR (SHEATH) IMPLANT
SHEATH PROBE COVER 6X72 (BAG) IMPLANT
STATION PROTECTION PRESSURIZED (MISCELLANEOUS) IMPLANT
STOPCOCK MORSE 400PSI 3WAY (MISCELLANEOUS) ×1 IMPLANT
TRAY PV CATH (CUSTOM PROCEDURE TRAY) ×1 IMPLANT
TUBING CIL FLEX 10 FLL-RA (TUBING) IMPLANT

## 2023-05-06 NOTE — Progress Notes (Signed)
[facility] [place]  ASSESSMENT / PLAN: 77 y.o. male with malfunctioning left arm AV fistula per report.  Plan fistulogram today.  CHIEF COMPLAINT: Poorly functioning left arm fistula  HISTORY OF PRESENT ILLNESS: Zachary [name] is a 77 y.o. male who presents to hospital for fistulogram.  [name] center called our office requesting fistulogram for high venous pressures and difficulty completing treatment.  On my evaluation, the patient is unsure why he is in the hospital.  He reports that his fistula seems to be working fine.  Past Medical History:  Diagnosis Date   Abnormal CT scan, kidney 10/06/2011   Acute pyelonephritis 10/07/2011   Anemia    normocytic   Anxiety    mental retardation   Bladder wall thickening 10/06/2011   BPH (benign prostatic hypertrophy)    Diabetes mellitus    Dialysis patient (HCC)    Tuesday, Thursday and Saturday,    DVT of leg (deep venous thrombosis) (HCC) 12/25/2016   Edema     history of lower extremity edema   GERD (gastroesophageal reflux disease)    Heme positive stool    Hydronephrosis    Hyperkalemia    Hyperlipidemia    Hypernatremia    Hypertension    Hypothyroidism    Impaired speech    Infected prosthetic vascular graft (HCC)    MR (mental retardation)    Muscle weakness    Obstructive uropathy    Perinephric abscess 10/07/2011   Poor historian poor historian   Primary colorectal adenocarcinoma (HCC) 04/2017   S/p total colectomy   Protein calorie malnutrition (HCC)    Pyelonephritis    Renal failure (ARF), acute on chronic (HCC)    Renal insufficiency    chronic history   Sepsis (HCC)    Smoking    Uremia    Urinary retention    UTI (lower urinary tract infection) 10/06/2011    Past Surgical History:  Procedure Laterality Date   A/V FISTULAGRAM N/A 08/13/2018   Procedure: A/V FISTULAGRAM - Right Upper;  Surgeon: Sherren Kerns, MD;  Location: [facility] INVASIVE CV LAB;  Service: Cardiovascular;   Laterality: N/A;   A/V FISTULAGRAM N/A 11/22/2018   Procedure: A/V FISTULAGRAM - Right Upper;  Surgeon: Maeola Harman, MD;  Location: [facility] INVASIVE CV LAB;  Service: Cardiovascular;  Laterality: N/A;   A/V FISTULAGRAM Left 10/08/2022   Procedure: A/V Fistulagram;  Surgeon: Victorino Sparrow, MD;  Location: [facility] INVASIVE CV LAB;  Service: Cardiovascular;  Laterality: Left;   AV FISTULA PLACEMENT Left 07/06/2015   Procedure:  INSERTION LEFT ARM ARTERIOVENOUS GORTEX GRAFT;  Surgeon: Chuck Hint, MD;  Location: [facility] OR;  Service: Vascular;  Laterality: Left;   AV FISTULA PLACEMENT Right 02/26/2016   Procedure: ARTERIOVENOUS (AV) FISTULA CREATION ;  Surgeon: Chuck Hint, MD;  Location: [facility] OR;  Service: Vascular;  Laterality: Right;   AV FISTULA PLACEMENT Right 11/25/2018   Procedure: INSERTION OF ARTERIOVENOUS (AV) ARTEGRAFT RIGHT UPPER ARM;  Surgeon: Maeola Harman, MD;  Location: [facility] OR;  Service: Vascular;  Laterality: Right;   AV FISTULA PLACEMENT Left 05/28/2021   Procedure: LEFT ARM ARTERIOVENOUS (AV) FISTULA;  Surgeon: Larina Earthly, MD;  Location: [facility] ORS;  Service: Vascular;  Laterality: Left;   AVGG REMOVAL Left 10/09/2015   Procedure: REMOVAL OF ARTERIOVENOUS GORETEX GRAFT (AVGG) Evacuation of Lymphocele, Vein Patch angioplasty of brachial artery.;  Surgeon: Chuck Hint, MD;  Location: [facility] OR;  Service: Vascular;  Laterality: Left;  BASCILIC VEIN TRANSPOSITION Right 02/26/2016   Procedure: Right BASCILIC VEIN TRANSPOSITION;  Surgeon: Chuck Hint, MD;  Location: [facility] OR;  Service: Vascular;  Laterality: Right;   BASCILIC VEIN TRANSPOSITION Left 07/30/2021   Procedure: LEFT ARM SECOND STAGE BASILIC VEIN TRANSPOSITION;  Surgeon: Larina Earthly, MD;  Location: [facility] ORS;  Service: Vascular;  Laterality: Left;   CIRCUMCISION N/A 01/04/2014   Procedure: CIRCUMCISION ADULT (procedure #1);  Surgeon: Ky Barban, MD;  Location: [facility] ORS;  Service:  Urology;  Laterality: N/A;   COLECTOMY N/A 05/04/2017   Procedure: TOTAL COLECTOMY;  Surgeon: Franky Macho, MD;  Location: [facility] ORS;  Service: General;  Laterality: N/A;   COLONOSCOPY N/A 04/27/2017   Surgeon: Corbin Ade, MD; annular mass in the ascending colon likely representing cancer biopsied, multiple 6-22 mm polyps removed, clean rectum.  Pathology with multiple tubular adenomas, high-grade dysplasia noted in ascending colon and splenic flexure.   CYSTOSCOPY W/ RETROGRADES Bilateral 06/29/2015   Procedure: CYSTOSCOPY, DILATION OF URETHRAL STRICTURE WITH BILATERAL RETROGRADE PYELOGRAM,SUPRAPUBIC TUBE CHANGE;  Surgeon: Jerilee Field, MD;  Location: [facility] ORS;  Service: Urology;  Laterality: Bilateral;   CYSTOSCOPY WITH URETHRAL DILATATION N/A 12/29/2013   Procedure: CYSTOSCOPY WITH URETHRAL DILATATION;  Surgeon: Ky Barban, MD;  Location: [facility];  Service: Urology;  Laterality: N/A;   ESOPHAGOGASTRODUODENOSCOPY N/A 04/27/2017   Procedure: ESOPHAGOGASTRODUODENOSCOPY (EGD);  Surgeon: Corbin Ade, MD;  [facility];  Service: Endoscopy;  Laterality: N/A;   FLEXIBLE SIGMOIDOSCOPY N/A 12/02/2021   Procedure: FLEXIBLE SIGMOIDOSCOPY;  Surgeon: Lanelle Bal, DO;  [facility];  Service: Endoscopy;  Laterality: N/A;  10:30am, dialysis patient   INSERTION OF DIALYSIS CATHETER Right 11/25/2018   Procedure: INSERTION OF DIALYSIS CATHETER RIGHT INTERNAL JUGULAR;  Surgeon: Maeola Harman, MD;  Location: [facility] OR;  Service: Vascular;  Laterality: Right;   IR AV DIALY SHUNT INTRO NEEDLE/INTRACATH INITIAL W/PTA/IMG RIGHT Right 09/07/2018   IR FLUORO GUIDE CV LINE RIGHT  10/16/2020   IR FLUORO GUIDE CV LINE RIGHT  10/03/2022   IR REMOVAL TUN CV CATH W/O FL  01/12/2019   IR THROMBECTOMY AV FISTULA W/THROMBOLYSIS/PTA INC/SHUNT/IMG RIGHT Right 04/26/2018   IR US GUIDE VASC ACCESS RIGHT  04/26/2018   IR US GUIDE VASC ACCESS RIGHT  09/07/2018   IR US GUIDE VASC  ACCESS RIGHT  10/16/2020   IR US GUIDE VASC ACCESS RIGHT  10/16/2020   IR US GUIDE VASC ACCESS RIGHT  10/03/2022   ORIF FEMUR FRACTURE Right 11/22/2016   Procedure: OPEN REDUCTION INTERNAL FIXATION (ORIF) DISTAL FEMUR FRACTURE;  Surgeon: Samson Frederic, MD;  Location: [facility] OR;  Service: Orthopedics;  Laterality: Right;   PATCH ANGIOPLASTY Right 12/10/2017   Procedure: PATCH ANGIOPLASTY;  Surgeon: Chuck Hint, MD;  Location: [facility] OR;  Service: Vascular;  Laterality: Right;   PERIPHERAL VASCULAR BALLOON ANGIOPLASTY  08/13/2018   Procedure: PERIPHERAL VASCULAR BALLOON ANGIOPLASTY;  Surgeon: Sherren Kerns, MD;  Location: [facility] INVASIVE CV LAB;  Service: Cardiovascular;;  right AV fistula    PERIPHERAL VASCULAR BALLOON ANGIOPLASTY  11/22/2018   Procedure: PERIPHERAL VASCULAR BALLOON ANGIOPLASTY;  Surgeon: Maeola Harman, MD;  Location: [facility] INVASIVE CV LAB;  Service: Cardiovascular;;  rt AV fistula   PERIPHERAL VASCULAR BALLOON ANGIOPLASTY  10/08/2022   Procedure: PERIPHERAL VASCULAR BALLOON ANGIOPLASTY;  Surgeon: Victorino Sparrow, MD;  Location: [facility] INVASIVE CV LAB;  Service: Cardiovascular;;  arterial anastamosis   PERIPHERAL VASCULAR CATHETERIZATION N/A 10/08/2015   Procedure: A/V Shuntogram;  Surgeon: Chuck Hint, MD;  Location: [facility] INVASIVE CV LAB;  Service: Cardiovascular;  Laterality: N/A;   PERIPHERAL VASCULAR INTERVENTION Left 10/08/2022   Procedure: PERIPHERAL VASCULAR INTERVENTION;  Surgeon: Victorino Sparrow, MD;  Location: [facility] INVASIVE CV LAB;  Service: Cardiovascular;  Laterality: Left;  left AVF   REMOVAL OF A DIALYSIS CATHETER N/A 12/03/2021   Procedure: MINOR REMOVAL OF A TUNNELED DIALYSIS CATHETER;  Surgeon: Larina Earthly, MD;  Location: [facility];  Service: Vascular;  Laterality: N/A;   REMOVAL OF A DIALYSIS CATHETER N/A 11/18/2022   Procedure: MINOR REMOVAL OF A TUNNELED DIALYSIS CATHETER;  Surgeon: Larina Earthly, MD;  Location: [facility];  Service: Vascular;   Laterality: N/A;   THROMBECTOMY W/ EMBOLECTOMY Right 12/10/2017   Procedure: THROMBECTOMY REVISION RIGHT ARM  ARTERIOVENOUS FISTULA;  Surgeon: Chuck Hint, MD;  Location: [facility] OR;  Service: Vascular;  Laterality: Right;   TRANSURETHRAL RESECTION OF PROSTATE N/A 01/04/2014   Procedure: TRANSURETHRAL RESECTION OF THE PROSTATE (TURP) (procedure #2);  Surgeon: Ky Barban, MD;  Location: [facility];  Service: Urology;  Laterality: N/A;    Family History  Problem Relation Age of Onset   Cancer Mother    Colon cancer Neg Hx     Social History   Socioeconomic History   Marital status: Single    Spouse name: Not on file   Number of children: Not on file   Years of education: Not on file   Highest education level: Not on file  Occupational History   Occupation: retired   Tobacco Use   Smoking status: Never   Smokeless tobacco: Never  Vaping Use   Vaping Use: Never used  Substance and Sexual Activity   Alcohol use: No   Drug use: No   Sexual activity: Not Currently  Other Topics Concern   Not on file  Social History Narrative   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   Long term resident of [facility]    Social Determinants of Health   Financial Resource Strain: Low Risk  (02/18/2018)   Overall Financial Resource Strain (CARDIA)    Difficulty of Paying Living Expenses: Not hard at all  Food Insecurity: No Food Insecurity (02/18/2018)   Hunger Vital Sign    Worried About Running Out of Food in the Last Year: Never true    Ran Out of Food in the Last Year: Never true  Transportation Needs: No Transportation Needs (02/18/2018)   PRAPARE - Administrator, Civil Service (Medical): No    Lack of Transportation (Non-Medical): No  Physical Activity: Unknown (09/15/2019)   Exercise Vital Sign    Days of Exercise per Week: Not on file    Minutes of Exercise per Session: 0 min  Stress: No Stress Concern Present (02/18/2018)   [employer] of Occupational Health - Occupational Stress Questionnaire    Feeling of Stress : Not at all  Social Connections: Unknown (09/15/2019)   Social Connection and Isolation Panel [NHANES]    Frequency of Communication with Friends and Family: Not on file    Frequency of Social Gatherings with Friends and Family: Never    Attends Religious Services: Not on file    Active Member of Clubs or Organizations: Not on file    Attends Banker Meetings: Not on file    Marital Status: Not on file  Intimate Partner Violence: Not At Risk (02/18/2018)   Humiliation, Afraid, Rape, and Kick questionnaire  Fear of Current or Ex-Partner: No    Emotionally Abused: No    Physically Abused: No    Sexually Abused: No    No Known Allergies  Current Facility-Administered Medications  Medication Dose Route Frequency Provider Last Rate Last Admin   fentaNYL (SUBLIMAZE) injection    PRN Leonie Douglas, MD   25 mcg at 05/06/23 1414   Heparin (Porcine) in NaCl 1000-0.9 UT/500ML-% SOLN    PRN Leonie Douglas, MD   500 mL at 05/06/23 1435   iodixanol (VISIPAQUE) 320 MG/ML injection    PRN Leonie Douglas, MD   30 mL at 05/06/23 1425   lidocaine (PF) (XYLOCAINE) 1 % injection    PRN Leonie Douglas, MD   5 mL at 05/06/23 1414   midazolam (VERSED) injection    PRN Leonie Douglas, MD   1 mg at 05/06/23 1414    PHYSICAL EXAM Vitals:   05/06/23 1206 05/06/23 1356  BP: (!) 149/76   Pulse: 74   Resp: 20   Temp: 99.3 F (37.4 C)   TempSrc: Tympanic   SpO2: 99% 96%  Weight: 90.4 kg   Height: 5\' 8"  (1.727 m)    Elderly man in no acute distress Regular rate and rhythm Unlabored breathing Left arm arteriovenous fistula with strong thrill  PERTINENT LABORATORY AND RADIOLOGIC DATA  Most recent CBC    Latest Ref Rng & Units 05/06/2023   11:56 AM 02/23/2023    6:00 AM 02/13/2023    8:00 AM  CBC  WBC 4.0 - 10.5 K/uL   3.8   Hemoglobin 13.0 - 17.0 g/dL 16.1  9.8  9.7   Hematocrit  39.0 - 52.0 % 38.0  29.7  28.7   Platelets 150 - 400 K/uL   126      Most recent CMP    Latest Ref Rng & Units 05/06/2023   11:56 AM 02/23/2023    6:00 AM 02/13/2023    8:00 AM  CMP  Glucose 70 - 99 mg/dL 93  096  67   BUN 8 - 23 mg/dL 44  24  18   Creatinine 0.61 - 1.24 mg/dL 0.45  4.09  8.11   Sodium 135 - 145 mmol/L 140  139  137   Potassium 3.5 - 5.1 mmol/L 5.1  3.6  2.9   Chloride 98 - 111 mmol/L 104  103  100   CO2 22 - 32 mmol/L  28  28   Calcium 8.9 - 10.3 mg/dL  9.6  8.8   Total Protein 6.5 - 8.1 g/dL   6.9   Total Bilirubin 0.3 - 1.2 mg/dL   0.6   Alkaline Phos 38 - 126 U/L   50   AST 15 - 41 U/L   28   ALT 0 - 44 U/L   19     Renal function Estimated Creatinine Clearance: 8.3 mL/min (A) (by C-G formula based on SCr of 8.5 mg/dL (H)).  Hgb A1c MFr Bld (%)  Date Value  09/29/2022 5.0    LDL Cholesterol  Date Value Ref Range Status  09/29/2022 41 0 - 99 mg/dL Final    Comment:           Total Cholesterol/HDL:CHD Risk Coronary Heart Disease Risk Table                     Men   Women  1/2 Average Risk   3.4   3.3  Average Risk  5.0   4.4  2 X Average Risk   9.6   7.1  3 X Average Risk  23.4   11.0        Use the calculated Patient Ratio above and the CHD Risk Table to determine the patient's CHD Risk.        ATP III CLASSIFICATION (LDL):  <100     mg/dL   Optimal  161-096  mg/dL   Near or Above                    Optimal  130-159  mg/dL   Borderline  045-409  mg/dL   High  >811     mg/dL   Very High Performed at [facility], [address], [place], [place] [address]    Direct LDL  Date Value Ref Range Status  06/03/2019 25.5 0 - 99 mg/dL Final    Comment:    Performed at [facility], [address], [place], [place] [address]    Rande Brunt. Lenell Antu, MD [facility] Phone Number: [phone] 05/06/2023 2:37 PM   Total time spent on preparing this encounter including chart review, data  review, collecting history, examining the patient, coordinating care for this established patient, 30 minutes.  Portions of this report may have been transcribed using voice recognition software.  Every effort has been made to ensure accuracy; however, inadvertent computerized transcription errors may still be present.

## 2023-05-06 NOTE — Op Note (Signed)
DATE OF SERVICE: 05/06/2023  PATIENT:  Zachary [name]  77 y.o. male  PRE-OPERATIVE DIAGNOSIS:  ESRD; malfunctioning AVF  POST-OPERATIVE DIAGNOSIS:  normal left arm AVF  PROCEDURE:   1) ultrasound-guided left radial artery access 2) left arm fistulogram 3) conscious sedation (7 minutes)  SURGEON:  Surgeon(s) and Role:    * Leonie Douglas, MD - Primary  ASSISTANT: none  ANESTHESIA:   local and IV sedation  EBL: minimal  BLOOD ADMINISTERED:none  DRAINS: none   LOCAL MEDICATIONS USED:  LIDOCAINE   SPECIMEN:  none  COUNTS: confirmed correct.  TOURNIQUET:  none  PATIENT DISPOSITION:  PACU - hemodynamically stable.   Delay start of Pharmacological VTE agent (>24hrs) due to surgical blood loss or risk of bleeding: no  INDICATION FOR PROCEDURE: KEONE KAMER is a 77 y.o. male with left arm malfunctioning fistula. After careful discussion of risks, benefits, and alternatives the patient was offered fistulagram. The patient understood and wished to proceed.  OPERATIVE FINDINGS:  No central venous stenosis; No subclavian or axillary vein stenosis No stenosis in fistula outflow Existing viabahn stent widely patent. Mild pseudoaneurysmal degeneration in distal stent - likely from chronic cannulation No perianastomotic stenosis No inflow abnormality  DESCRIPTION OF PROCEDURE: After identification of the patient in the pre-operative holding area, the patient was transferred to the operating room. The patient was positioned supine on the operating room table. Anesthesia was induced. The left arm was prepped and draped in standard fashion. A surgical pause was performed confirming correct patient, procedure, and operative location.  Ultrasound guidance was used to obtain access in the left radial artery.  The skin over the radial artery was anesthetized with 1% lidocaine.  Micropuncture needle was introduced and singlewall access into the radial artery.  An 018 wire was navigated  without resistance into the radial artery.  A slender sheath was placed into the radial artery without difficulty.  Fistulogram was performed in stations.  See above for details.  No abnormality was identified.  The sheath was removed and a transradial band applied.  Conscious sedation was administered with the use of IV fentanyl and midazolam under continuous physician and nurse monitoring.  Heart rate, blood pressure, and oxygen saturation were continuously monitored.  Total sedation time was 7 minutes  Upon completion of the case instrument and sharps counts were confirmed correct. The patient was transferred to the  PACU in good condition. I was present for all portions of the procedure.  FOLLOW UP PLAN: Follow up as needed.  Rande Brunt. Lenell Antu, MD [facility] and [facility]in [facility] of [facility] Phone Number: [phone] 05/06/2023 2:47 PM

## 2023-05-06 NOTE — Discharge Instructions (Signed)
Drink plenty of fluids for 48 hours and keep wrist elevated at heart level for 24 hours  Radial Site Care   This sheet gives you information about how to care for yourself after your procedure. Your health care provider may also give you more specific instructions. If you have problems or questions, contact your health care provider. What can I expect after the procedure? After the procedure, it is common to have: Bruising and tenderness at the catheter insertion area. Follow these instructions at home: Medicines Take over-the-counter and prescription medicines only as told by your health care provider. Insertion site care Follow instructions from your health care provider about how to take care of your insertion site. Make sure you: Wash your hands with soap and water before you change your bandage (dressing). If soap and water are not available, use hand sanitizer. Remove your dressing at 4:00 pm 05/07/2023 Check your insertion site every day for signs of infection. Check for: Redness, swelling, or pain. Fluid or blood. Pus or a bad smell. Warmth. Do not take baths, swim, or use a hot tub until your health care provider approves. You may shower after dressing removal Remove the dressing at 4:00 pm 05/07/2023 Pat the area dry with a clean towel. Do not rub the site. That could cause bleeding. Do not apply powder or lotion to the site. Activity   For 24 hours after the procedure, or as directed by your health care provider: Do not flex or bend the affected arm. Do not push or pull heavy objects with the affected arm. Do not drive yourself home from the hospital or clinic. You may drive 24 hours after the procedure unless your health care provider tells you not to. Do not operate machinery or power tools. Do not lift anything that is heavier than 10 lb (4.5 kg), or the limit that you are told, until your health care provider says that it is safe.  For 4 days Ask your health care  provider when it is okay to: Return to work or school. Resume usual physical activities or sports. Resume sexual activity. General instructions If the catheter site starts to bleed, raise your arm and put firm pressure on the site. If the bleeding does not stop, get help right away. This is a medical emergency. If you went home on the same day as your procedure, a responsible adult should be with you for the first 24 hours after you arrive home. Keep all follow-up visits as told by your health care provider. This is important. Contact a health care provider if: You have a fever. You have redness, swelling, or yellow drainage around your insertion site. Get help right away if: You have unusual pain at the radial site. The catheter insertion area swells very fast. The insertion area is bleeding, and the bleeding does not stop when you hold steady pressure on the area. Your arm or hand becomes pale, cool, tingly, or numb. These symptoms may represent a serious problem that is an emergency. Do not wait to see if the symptoms will go away. Get medical help right away. Call your local emergency services (911 in the [place] Do not drive yourself to the hospital. Summary After the procedure, it is common to have bruising and tenderness at the site. Follow instructions from your health care provider about how to take care of your radial site wound. Check the wound every day for signs of infection. Do not lift anything that is heavier than 10 lb (  4.5 kg), or the limit that you are told, until your health care provider says that it is safe. This information is not intended to replace advice given to you by your health care provider. Make sure you discuss any questions you have with your health care provider. Document Revised: 11/18/2017 Document Reviewed: 11/18/2017 Elsevier Patient Education  2020 [employer].

## 2023-05-07 ENCOUNTER — Encounter (HOSPITAL_COMMUNITY): Payer: Self-pay | Admitting: Vascular Surgery

## 2023-05-07 DIAGNOSIS — N2581 Secondary hyperparathyroidism of renal origin: Secondary | ICD-10-CM | POA: Diagnosis not present

## 2023-05-07 DIAGNOSIS — N186 End stage renal disease: Secondary | ICD-10-CM | POA: Diagnosis not present

## 2023-05-07 DIAGNOSIS — Z992 Dependence on renal dialysis: Secondary | ICD-10-CM | POA: Diagnosis not present

## 2023-05-07 DIAGNOSIS — N250 Renal osteodystrophy: Secondary | ICD-10-CM | POA: Diagnosis not present

## 2023-05-07 DIAGNOSIS — D631 Anemia in chronic kidney disease: Secondary | ICD-10-CM | POA: Diagnosis not present

## 2023-05-07 DIAGNOSIS — D509 Iron deficiency anemia, unspecified: Secondary | ICD-10-CM | POA: Diagnosis not present

## 2023-05-09 DIAGNOSIS — Z992 Dependence on renal dialysis: Secondary | ICD-10-CM | POA: Diagnosis not present

## 2023-05-09 DIAGNOSIS — N2581 Secondary hyperparathyroidism of renal origin: Secondary | ICD-10-CM | POA: Diagnosis not present

## 2023-05-09 DIAGNOSIS — D509 Iron deficiency anemia, unspecified: Secondary | ICD-10-CM | POA: Diagnosis not present

## 2023-05-09 DIAGNOSIS — N250 Renal osteodystrophy: Secondary | ICD-10-CM | POA: Diagnosis not present

## 2023-05-09 DIAGNOSIS — N186 End stage renal disease: Secondary | ICD-10-CM | POA: Diagnosis not present

## 2023-05-09 DIAGNOSIS — D631 Anemia in chronic kidney disease: Secondary | ICD-10-CM | POA: Diagnosis not present

## 2023-05-11 ENCOUNTER — Encounter: Payer: Self-pay | Admitting: Internal Medicine

## 2023-05-11 ENCOUNTER — Non-Acute Institutional Stay (SKILLED_NURSING_FACILITY): Payer: Medicare Other | Admitting: Internal Medicine

## 2023-05-11 DIAGNOSIS — I700 Atherosclerosis of aorta: Secondary | ICD-10-CM | POA: Diagnosis not present

## 2023-05-11 DIAGNOSIS — E034 Atrophy of thyroid (acquired): Secondary | ICD-10-CM | POA: Diagnosis not present

## 2023-05-11 DIAGNOSIS — Z992 Dependence on renal dialysis: Secondary | ICD-10-CM

## 2023-05-11 DIAGNOSIS — N186 End stage renal disease: Secondary | ICD-10-CM

## 2023-05-11 DIAGNOSIS — D649 Anemia, unspecified: Secondary | ICD-10-CM | POA: Diagnosis not present

## 2023-05-11 NOTE — Patient Instructions (Signed)
 See assessment and plan under each diagnosis in the problem list and acutely for this visit 

## 2023-05-11 NOTE — Progress Notes (Signed)
NURSING HOME LOCATION:  [facility] ROOM NUMBER:  [id number]  CODE STATUS:  Full Code  PCP:  Synthia Innocent NP  This is a nursing facility follow up visit of chronic medical diagnoses & to document compliance with Regulation 483.30 (c) in The [employer] Survey Manual Phase 2 which mandates caregiver visit ( visits can alternate among physician, PA or NP as per statutes) within 10 days of 30 days / 60 days/ 90 days post admission to SNF date    Interim medical record and care since last SNF visit was updated with review of diagnostic studies and change in clinical status since last visit were documented.  HPI: He is a permanent resident of this facility with medical diagnoses of history of acute pyelonephritis, end-stage renal disease on hemodialysis, history of DVT, GERD, dyslipidemia, essential hypertension, hypothyroidism, mental retardation, history of colorectal adenocarcinoma, diabetes with vascular disease, and protein/caloric malnutrition.   Total colectomy with side-to-side ileorectal anastomosis was completed in 2018. Labs are checked at hemodialysis 3 times a week.  His most recent values in Epic revealed a creatinine of 8.50 with a GFR of 6.  On 7/10 H/H was 12.9/38 up from prior values of 9.8/29.7.The most recent total protein was 6.9 and albumin 3.2 on 02/13/2023. GI evaluation was completed 05/04/2023; he was seen for nausea, vomiting, and hematemesis.  Omeprazole 40 mg daily was recommended along with some dietary interventions.  Lab update with iron panel, folate, and B12 levels were recommended.  Review of systems: Dementia invalidated responses.  He was unable to describe the AV fistulagram performed 7/10 stating "my arm did something to it."  He struggled to give me his hemodialysis schedule which is Tuesday, Thursday, and Saturday.  The entire review of systems is negative.  Staff states that he actually is pulling out  teeth.  He denies any dental issues.  When I  asked had; he actually removed any of his teeth he stated "it was loose."   Constitutional: No fever, significant weight change, fatigue  Eyes: No redness, discharge, pain, vision change ENT/mouth: No nasal congestion,  purulent discharge, earache, change in hearing, sore throat  Cardiovascular: No chest pain, palpitations, paroxysmal nocturnal dyspnea, claudication, edema  Respiratory: No cough, sputum production, hemoptysis, DOE, significant snoring, apnea   Gastrointestinal: No heartburn, dysphagia, abdominal pain, nausea /vomiting, rectal bleeding, melena, change in bowels Genitourinary: No dysuria, hematuria, pyuria, incontinence, nocturia Musculoskeletal: No joint stiffness, joint swelling, weakness, pain Dermatologic: No rash, pruritus, change in appearance of skin Neurologic: No dizziness, headache, syncope, seizures, numbness, tingling Psychiatric: No significant anxiety, depression, insomnia, anorexia Endocrine: No change in hair/skin/nails, excessive thirst, excessive hunger, excessive urination  Hematologic/lymphatic: No significant bruising, lymphadenopathy, abnormal bleeding Allergy/immunology: No itchy/watery eyes, significant sneezing, urticaria, angioedema  Physical exam:  Pertinent or positive findings: He appears somewhat chronically ill.  Speech is garbled.  There is slight exotropia of the right eye.  The left nasolabial fold is decreased.  He is missing the upper incisors.  The remaining teeth are plaque coated.  Breath sounds are decreased.  Abdomen is protuberant.  He has decreased pedal pulses.  There is trace-1/2+ edema at the sock line.  Interosseous wasting is present.  He is symmetrically weak to opposition.  There is a fine tremor of the right thumb.  General appearance:  no acute distress, increased work of breathing is present.   Lymphatic: No lymphadenopathy about the head, neck, axilla. Eyes: No conjunctival inflammation or lid edema is present.  There is no  scleral icterus. Ears:  External ear exam shows no significant lesions or deformities.   Nose:  External nasal examination shows no deformity or inflammation. Nasal mucosa are pink and moist without lesions, exudates Neck:  No thyromegaly, masses, tenderness noted.    Heart:  Normal rate and regular rhythm. S1 and S2 normal without gallop, murmur, click, rub .  Lungs:  without wheezes, rhonchi, rales, rubs. Abdomen: Bowel sounds are normal. Abdomen is soft and nontender with no organomegaly, hernias, masses. GU: Deferred  Extremities:  No cyanosis, clubbing  Skin: Warm & dry w/o tenting. No significant lesions or rash.  See summary under each active problem in the Problem List with associated updated therapeutic plan \

## 2023-05-12 DIAGNOSIS — D509 Iron deficiency anemia, unspecified: Secondary | ICD-10-CM | POA: Diagnosis not present

## 2023-05-12 DIAGNOSIS — N250 Renal osteodystrophy: Secondary | ICD-10-CM | POA: Diagnosis not present

## 2023-05-12 DIAGNOSIS — N186 End stage renal disease: Secondary | ICD-10-CM | POA: Insufficient documentation

## 2023-05-12 DIAGNOSIS — Z992 Dependence on renal dialysis: Secondary | ICD-10-CM | POA: Insufficient documentation

## 2023-05-12 DIAGNOSIS — N2581 Secondary hyperparathyroidism of renal origin: Secondary | ICD-10-CM | POA: Diagnosis not present

## 2023-05-12 DIAGNOSIS — D649 Anemia, unspecified: Secondary | ICD-10-CM | POA: Insufficient documentation

## 2023-05-12 DIAGNOSIS — D631 Anemia in chronic kidney disease: Secondary | ICD-10-CM | POA: Diagnosis not present

## 2023-05-12 NOTE — Assessment & Plan Note (Signed)
TSH update indicated

## 2023-05-12 NOTE — Assessment & Plan Note (Signed)
H/H has improved significantly from values of 9.8/29.7-12.9/38.  Gastroenterology evaluated him on 7/8 for possible hematemesis.  Unfortunately with his mental retardation he is unable to provide adequate history.  Omeprazole 40 mg daily was recommended along with iron panel, folate, and B12 levels.  These will be updated.

## 2023-05-12 NOTE — Assessment & Plan Note (Signed)
Fistulogram completed 05/06/2023 without complication.

## 2023-05-12 NOTE — Assessment & Plan Note (Signed)
He denies any anginal equivalent at this time.  No change indicated.

## 2023-05-14 DIAGNOSIS — D509 Iron deficiency anemia, unspecified: Secondary | ICD-10-CM | POA: Diagnosis not present

## 2023-05-14 DIAGNOSIS — N2581 Secondary hyperparathyroidism of renal origin: Secondary | ICD-10-CM | POA: Diagnosis not present

## 2023-05-14 DIAGNOSIS — N250 Renal osteodystrophy: Secondary | ICD-10-CM | POA: Diagnosis not present

## 2023-05-14 DIAGNOSIS — N186 End stage renal disease: Secondary | ICD-10-CM | POA: Diagnosis not present

## 2023-05-14 DIAGNOSIS — D631 Anemia in chronic kidney disease: Secondary | ICD-10-CM | POA: Diagnosis not present

## 2023-05-14 DIAGNOSIS — Z992 Dependence on renal dialysis: Secondary | ICD-10-CM | POA: Diagnosis not present

## 2023-05-16 DIAGNOSIS — N186 End stage renal disease: Secondary | ICD-10-CM | POA: Diagnosis not present

## 2023-05-16 DIAGNOSIS — N2581 Secondary hyperparathyroidism of renal origin: Secondary | ICD-10-CM | POA: Diagnosis not present

## 2023-05-16 DIAGNOSIS — Z992 Dependence on renal dialysis: Secondary | ICD-10-CM | POA: Diagnosis not present

## 2023-05-16 DIAGNOSIS — D631 Anemia in chronic kidney disease: Secondary | ICD-10-CM | POA: Diagnosis not present

## 2023-05-16 DIAGNOSIS — N250 Renal osteodystrophy: Secondary | ICD-10-CM | POA: Diagnosis not present

## 2023-05-16 DIAGNOSIS — D509 Iron deficiency anemia, unspecified: Secondary | ICD-10-CM | POA: Diagnosis not present

## 2023-05-19 DIAGNOSIS — D509 Iron deficiency anemia, unspecified: Secondary | ICD-10-CM | POA: Diagnosis not present

## 2023-05-19 DIAGNOSIS — N2581 Secondary hyperparathyroidism of renal origin: Secondary | ICD-10-CM | POA: Diagnosis not present

## 2023-05-19 DIAGNOSIS — N250 Renal osteodystrophy: Secondary | ICD-10-CM | POA: Diagnosis not present

## 2023-05-19 DIAGNOSIS — N186 End stage renal disease: Secondary | ICD-10-CM | POA: Diagnosis not present

## 2023-05-19 DIAGNOSIS — D631 Anemia in chronic kidney disease: Secondary | ICD-10-CM | POA: Diagnosis not present

## 2023-05-19 DIAGNOSIS — Z992 Dependence on renal dialysis: Secondary | ICD-10-CM | POA: Diagnosis not present

## 2023-05-21 DIAGNOSIS — N186 End stage renal disease: Secondary | ICD-10-CM | POA: Diagnosis not present

## 2023-05-21 DIAGNOSIS — N250 Renal osteodystrophy: Secondary | ICD-10-CM | POA: Diagnosis not present

## 2023-05-21 DIAGNOSIS — D509 Iron deficiency anemia, unspecified: Secondary | ICD-10-CM | POA: Diagnosis not present

## 2023-05-21 DIAGNOSIS — N2581 Secondary hyperparathyroidism of renal origin: Secondary | ICD-10-CM | POA: Diagnosis not present

## 2023-05-21 DIAGNOSIS — D631 Anemia in chronic kidney disease: Secondary | ICD-10-CM | POA: Diagnosis not present

## 2023-05-21 DIAGNOSIS — Z992 Dependence on renal dialysis: Secondary | ICD-10-CM | POA: Diagnosis not present

## 2023-05-23 DIAGNOSIS — N186 End stage renal disease: Secondary | ICD-10-CM | POA: Diagnosis not present

## 2023-05-23 DIAGNOSIS — D631 Anemia in chronic kidney disease: Secondary | ICD-10-CM | POA: Diagnosis not present

## 2023-05-23 DIAGNOSIS — Z992 Dependence on renal dialysis: Secondary | ICD-10-CM | POA: Diagnosis not present

## 2023-05-23 DIAGNOSIS — N250 Renal osteodystrophy: Secondary | ICD-10-CM | POA: Diagnosis not present

## 2023-05-23 DIAGNOSIS — N2581 Secondary hyperparathyroidism of renal origin: Secondary | ICD-10-CM | POA: Diagnosis not present

## 2023-05-23 DIAGNOSIS — D509 Iron deficiency anemia, unspecified: Secondary | ICD-10-CM | POA: Diagnosis not present

## 2023-05-26 DIAGNOSIS — Z992 Dependence on renal dialysis: Secondary | ICD-10-CM | POA: Diagnosis not present

## 2023-05-26 DIAGNOSIS — N2581 Secondary hyperparathyroidism of renal origin: Secondary | ICD-10-CM | POA: Diagnosis not present

## 2023-05-26 DIAGNOSIS — D631 Anemia in chronic kidney disease: Secondary | ICD-10-CM | POA: Diagnosis not present

## 2023-05-26 DIAGNOSIS — N186 End stage renal disease: Secondary | ICD-10-CM | POA: Diagnosis not present

## 2023-05-26 DIAGNOSIS — D509 Iron deficiency anemia, unspecified: Secondary | ICD-10-CM | POA: Diagnosis not present

## 2023-05-26 DIAGNOSIS — N250 Renal osteodystrophy: Secondary | ICD-10-CM | POA: Diagnosis not present

## 2023-05-27 DIAGNOSIS — Z992 Dependence on renal dialysis: Secondary | ICD-10-CM | POA: Diagnosis not present

## 2023-05-27 DIAGNOSIS — N186 End stage renal disease: Secondary | ICD-10-CM | POA: Diagnosis not present

## 2023-05-28 DIAGNOSIS — N2581 Secondary hyperparathyroidism of renal origin: Secondary | ICD-10-CM | POA: Diagnosis not present

## 2023-05-28 DIAGNOSIS — Z992 Dependence on renal dialysis: Secondary | ICD-10-CM | POA: Diagnosis not present

## 2023-05-28 DIAGNOSIS — D509 Iron deficiency anemia, unspecified: Secondary | ICD-10-CM | POA: Diagnosis not present

## 2023-05-28 DIAGNOSIS — N186 End stage renal disease: Secondary | ICD-10-CM | POA: Diagnosis not present

## 2023-05-28 DIAGNOSIS — D631 Anemia in chronic kidney disease: Secondary | ICD-10-CM | POA: Diagnosis not present

## 2023-05-28 DIAGNOSIS — N250 Renal osteodystrophy: Secondary | ICD-10-CM | POA: Diagnosis not present

## 2023-05-30 DIAGNOSIS — N250 Renal osteodystrophy: Secondary | ICD-10-CM | POA: Diagnosis not present

## 2023-05-30 DIAGNOSIS — D509 Iron deficiency anemia, unspecified: Secondary | ICD-10-CM | POA: Diagnosis not present

## 2023-05-30 DIAGNOSIS — D631 Anemia in chronic kidney disease: Secondary | ICD-10-CM | POA: Diagnosis not present

## 2023-05-30 DIAGNOSIS — Z992 Dependence on renal dialysis: Secondary | ICD-10-CM | POA: Diagnosis not present

## 2023-05-30 DIAGNOSIS — N186 End stage renal disease: Secondary | ICD-10-CM | POA: Diagnosis not present

## 2023-05-30 DIAGNOSIS — N2581 Secondary hyperparathyroidism of renal origin: Secondary | ICD-10-CM | POA: Diagnosis not present

## 2023-05-30 NOTE — H&P (Signed)
See progress note from same day for H&P details.

## 2023-06-02 DIAGNOSIS — N250 Renal osteodystrophy: Secondary | ICD-10-CM | POA: Diagnosis not present

## 2023-06-02 DIAGNOSIS — N186 End stage renal disease: Secondary | ICD-10-CM | POA: Diagnosis not present

## 2023-06-02 DIAGNOSIS — D509 Iron deficiency anemia, unspecified: Secondary | ICD-10-CM | POA: Diagnosis not present

## 2023-06-02 DIAGNOSIS — N2581 Secondary hyperparathyroidism of renal origin: Secondary | ICD-10-CM | POA: Diagnosis not present

## 2023-06-02 DIAGNOSIS — Z992 Dependence on renal dialysis: Secondary | ICD-10-CM | POA: Diagnosis not present

## 2023-06-02 DIAGNOSIS — D631 Anemia in chronic kidney disease: Secondary | ICD-10-CM | POA: Diagnosis not present

## 2023-06-04 DIAGNOSIS — N250 Renal osteodystrophy: Secondary | ICD-10-CM | POA: Diagnosis not present

## 2023-06-04 DIAGNOSIS — N2581 Secondary hyperparathyroidism of renal origin: Secondary | ICD-10-CM | POA: Diagnosis not present

## 2023-06-04 DIAGNOSIS — D631 Anemia in chronic kidney disease: Secondary | ICD-10-CM | POA: Diagnosis not present

## 2023-06-04 DIAGNOSIS — D509 Iron deficiency anemia, unspecified: Secondary | ICD-10-CM | POA: Diagnosis not present

## 2023-06-04 DIAGNOSIS — N186 End stage renal disease: Secondary | ICD-10-CM | POA: Diagnosis not present

## 2023-06-04 DIAGNOSIS — Z992 Dependence on renal dialysis: Secondary | ICD-10-CM | POA: Diagnosis not present

## 2023-06-06 DIAGNOSIS — N250 Renal osteodystrophy: Secondary | ICD-10-CM | POA: Diagnosis not present

## 2023-06-06 DIAGNOSIS — D631 Anemia in chronic kidney disease: Secondary | ICD-10-CM | POA: Diagnosis not present

## 2023-06-06 DIAGNOSIS — D509 Iron deficiency anemia, unspecified: Secondary | ICD-10-CM | POA: Diagnosis not present

## 2023-06-06 DIAGNOSIS — N2581 Secondary hyperparathyroidism of renal origin: Secondary | ICD-10-CM | POA: Diagnosis not present

## 2023-06-06 DIAGNOSIS — N186 End stage renal disease: Secondary | ICD-10-CM | POA: Diagnosis not present

## 2023-06-06 DIAGNOSIS — Z992 Dependence on renal dialysis: Secondary | ICD-10-CM | POA: Diagnosis not present

## 2023-06-09 DIAGNOSIS — N2581 Secondary hyperparathyroidism of renal origin: Secondary | ICD-10-CM | POA: Diagnosis not present

## 2023-06-09 DIAGNOSIS — D631 Anemia in chronic kidney disease: Secondary | ICD-10-CM | POA: Diagnosis not present

## 2023-06-09 DIAGNOSIS — N250 Renal osteodystrophy: Secondary | ICD-10-CM | POA: Diagnosis not present

## 2023-06-09 DIAGNOSIS — D509 Iron deficiency anemia, unspecified: Secondary | ICD-10-CM | POA: Diagnosis not present

## 2023-06-09 DIAGNOSIS — Z992 Dependence on renal dialysis: Secondary | ICD-10-CM | POA: Diagnosis not present

## 2023-06-09 DIAGNOSIS — N186 End stage renal disease: Secondary | ICD-10-CM | POA: Diagnosis not present

## 2023-06-11 ENCOUNTER — Emergency Department (HOSPITAL_COMMUNITY)
Admission: EM | Admit: 2023-06-11 | Discharge: 2023-06-11 | Disposition: A | Discharge status: Home / Self Care | Payer: Medicare Other | Attending: Emergency Medicine | Admitting: Emergency Medicine

## 2023-06-11 ENCOUNTER — Encounter (HOSPITAL_COMMUNITY): Payer: Self-pay | Admitting: Emergency Medicine

## 2023-06-11 ENCOUNTER — Other Ambulatory Visit: Payer: Self-pay

## 2023-06-11 DIAGNOSIS — Z992 Dependence on renal dialysis: Secondary | ICD-10-CM | POA: Diagnosis not present

## 2023-06-11 DIAGNOSIS — I959 Hypotension, unspecified: Secondary | ICD-10-CM | POA: Diagnosis not present

## 2023-06-11 DIAGNOSIS — R531 Weakness: Secondary | ICD-10-CM | POA: Diagnosis not present

## 2023-06-11 DIAGNOSIS — Z794 Long term (current) use of insulin: Secondary | ICD-10-CM | POA: Diagnosis not present

## 2023-06-11 DIAGNOSIS — N186 End stage renal disease: Secondary | ICD-10-CM | POA: Diagnosis not present

## 2023-06-11 DIAGNOSIS — R42 Dizziness and giddiness: Secondary | ICD-10-CM | POA: Diagnosis not present

## 2023-06-11 DIAGNOSIS — N2581 Secondary hyperparathyroidism of renal origin: Secondary | ICD-10-CM | POA: Diagnosis not present

## 2023-06-11 DIAGNOSIS — Z7982 Long term (current) use of aspirin: Secondary | ICD-10-CM | POA: Diagnosis not present

## 2023-06-11 DIAGNOSIS — N250 Renal osteodystrophy: Secondary | ICD-10-CM | POA: Diagnosis not present

## 2023-06-11 DIAGNOSIS — D631 Anemia in chronic kidney disease: Secondary | ICD-10-CM | POA: Diagnosis not present

## 2023-06-11 DIAGNOSIS — D509 Iron deficiency anemia, unspecified: Secondary | ICD-10-CM | POA: Diagnosis not present

## 2023-06-11 DIAGNOSIS — I9589 Other hypotension: Secondary | ICD-10-CM

## 2023-06-11 LAB — CBC WITH DIFFERENTIAL/PLATELET
Abs Immature Granulocytes: 0.03 10*3/uL (ref 0.00–0.07)
Basophils Absolute: 0 10*3/uL (ref 0.0–0.1)
Basophils Relative: 0 %
Eosinophils Absolute: 0 10*3/uL (ref 0.0–0.5)
Eosinophils Relative: 1 %
HCT: 35.2 % — ABNORMAL LOW (ref 39.0–52.0)
Hemoglobin: 11.7 g/dL — ABNORMAL LOW (ref 13.0–17.0)
Immature Granulocytes: 1 %
Lymphocytes Relative: 9 %
Lymphs Abs: 0.4 10*3/uL — ABNORMAL LOW (ref 0.7–4.0)
MCH: 34 pg (ref 26.0–34.0)
MCHC: 33.2 g/dL (ref 30.0–36.0)
MCV: 102.3 fL — ABNORMAL HIGH (ref 80.0–100.0)
Monocytes Absolute: 0.6 10*3/uL (ref 0.1–1.0)
Monocytes Relative: 14 %
Neutro Abs: 3.3 10*3/uL (ref 1.7–7.7)
Neutrophils Relative %: 75 %
Platelets: 88 10*3/uL — ABNORMAL LOW (ref 150–400)
RBC: 3.44 MIL/uL — ABNORMAL LOW (ref 4.22–5.81)
RDW: 13.8 % (ref 11.5–15.5)
WBC: 4.4 10*3/uL (ref 4.0–10.5)
nRBC: 0 % (ref 0.0–0.2)

## 2023-06-11 LAB — BASIC METABOLIC PANEL
Anion gap: 11 (ref 5–15)
BUN: 20 mg/dL (ref 8–23)
CO2: 28 mmol/L (ref 22–32)
Calcium: 8.4 mg/dL — ABNORMAL LOW (ref 8.9–10.3)
Chloride: 98 mmol/L (ref 98–111)
Creatinine, Ser: 4.71 mg/dL — ABNORMAL HIGH (ref 0.61–1.24)
GFR, Estimated: 12 mL/min — ABNORMAL LOW (ref >60)
Glucose, Bld: 160 mg/dL — ABNORMAL HIGH (ref 70–99)
Potassium: 3.5 mmol/L (ref 3.5–5.1)
Sodium: 137 mmol/L (ref 135–145)

## 2023-06-11 NOTE — ED Triage Notes (Signed)
EMS called out to [facility] with complaints of a weak pulse and low bp. Pt was 8 mins from completion of dialysis. Pt had 3 L removed. Dialysis had 2.6 L replaced. Upon EMS arrival pt BP was 100/60 with a moderate pulse of 88 and a CBG of 163. Pt is alert and oriented at baseline.

## 2023-06-11 NOTE — ED Notes (Signed)
Attempted to call report

## 2023-06-11 NOTE — Discharge Instructions (Addendum)
Follow-up with your regular scheduled dialysis.  Otherwise take your medicines as prescribed.  Return to the ER if you develop any other new or worsening symptoms.

## 2023-06-11 NOTE — ED Provider Notes (Signed)
[place] EMERGENCY DEPARTMENT AT [facility] Provider Note   CSN: [id number] Arrival date & time: 06/11/23  1020     History  Chief Complaint  Patient presents with   Hypotension    Zachary [name] is a 77 y.o. male.  HPI 77 year old male with multiple comorbidities including ESRD on dialysis presents with hypotension and dizziness.  He has a history of mental retardation which limits the history somewhat.  However he seems to be at his baseline according to the nurse and EMS.  Patient had an episode of feeling lightheaded/weak and his blood pressure was noted to go down and his pulse seems thready to the dialysis center.  They had taken off 3 L and gave him some fluid back and his symptoms seem to resolved.  Blood pressure was normal with EMS.  When I am talking to the patient he endorses that he was dizzy at dialysis but feels fine now.  Denies any recent illness or chest pain or shortness of breath.  Home Medications Prior to Admission medications   Medication Sig Start Date End Date Taking? Authorizing Provider  aspirin EC 81 MG tablet Take 81 mg by mouth daily. 0900    [provider]  atorvastatin (LIPITOR) 20 MG tablet Take 20 mg by mouth at bedtime. 2200    [provider]  Insulin Glargine (BASAGLAR KWIKPEN) 100 UNIT/ML Inject 8 Units into the skin at bedtime. 2200    [provider]  Insulin Pen Needle (BD AUTOSHIELD DUO) 30G X 5 MM MISC by Does not apply route. 3/16"    [provider]  levothyroxine (SYNTHROID) 125 MCG tablet Take 125 mcg by mouth at bedtime. 2200    [provider]  midodrine (PROAMATINE) 10 MG tablet Take 10 mg by mouth See admin instructions. Send med with resident on  Tues/Thurs/Sat to dialysis. do not give at [facility], give to resident to take with him. send with 5mg  to equal 15mg . 6:00 06/01/19   [provider]  midodrine (PROAMATINE) 5 MG tablet Take 5 mg by mouth See admin instructions.  Send with resident on dialysis days.  Tues/Thurs/Sat  Take along with 10 mg to equal 15 mg 6:00 06/01/19   [provider]  NON FORMULARY Diet: Regular diet    [provider]  Nutritional Supplements (,FEEDING SUPPLEMENT, PROSOURCE PLUS) liquid Take 30 mLs by mouth 2 (two) times daily between meals. 1600 and 2200    [provider]  omega-3 acid ethyl esters (LOVAZA) 1 g capsule Take 2 g by mouth at bedtime. 2200 06/03/19   [provider]  omeprazole (PRILOSEC) 40 MG capsule Take 40 mg by mouth at bedtime. 2200 01/23/20   [provider]  polyethylene glycol (MIRALAX / GLYCOLAX) packet Take 17 g by mouth daily as needed for mild constipation.  05/27/20   [provider]  sevelamer carbonate (RENVELA) 800 MG tablet Take 3 tablets (2,400 mg total) by mouth 3 (three) times daily with meals. 10/03/22   Ghimire, Werner Lean, MD  tamsulosin (FLOMAX) 0.4 MG CAPS capsule Take 0.4 mg by mouth at bedtime. Give 30 minutes after a meal. Do not crush or chew    [provider]  torsemide (DEMADEX) 20 MG tablet Take 20 mg by mouth See admin instructions. 20mg  daily at 2000 on Sunday, Monday, Wednesday, and Friday only.    [provider]  vitamin B-12 (CYANOCOBALAMIN) 1000 MCG tablet Take 1,000 mcg by mouth at bedtime.  [provider]      Allergies    Patient has no known allergies.    Review of Systems   Review of Systems  Respiratory:  Negative for shortness of breath.   Cardiovascular:  Negative for chest pain.  Neurological:  Positive for light-headedness.    Physical Exam Updated Vital Signs BP 125/74   Pulse 100   Temp 98.3 F (36.8 C) (Oral)   Resp 18   SpO2 98%  Physical Exam Vitals and nursing note reviewed.  Constitutional:      Appearance: He is well-developed.  HENT:     Head: Normocephalic and atraumatic.  Cardiovascular:     Rate and Rhythm: Normal rate and regular rhythm.     Pulses:          Radial  pulses are 2+ on the right side.     Heart sounds: Normal heart sounds.  Pulmonary:     Effort: Pulmonary effort is normal.     Breath sounds: Normal breath sounds.  Abdominal:     General: There is no distension.  Skin:    General: Skin is warm and dry.  Neurological:     Mental Status: He is alert.     ED Results / Procedures / Treatments   Labs (all labs ordered are listed, but only abnormal results are displayed) Labs Reviewed  CBC WITH DIFFERENTIAL/PLATELET - Abnormal; Notable for the following components:      Result Value   RBC 3.44 (*)    Hemoglobin 11.7 (*)    HCT 35.2 (*)    MCV 102.3 (*)    Platelets 88 (*)    Lymphs Abs 0.4 (*)    All other components within normal limits  BASIC METABOLIC PANEL - Abnormal; Notable for the following components:   Glucose, Bld 160 (*)    Creatinine, Ser 4.71 (*)    Calcium 8.4 (*)    GFR, Estimated 12 (*)    All other components within normal limits    EKG EKG Interpretation Date/Time:  Thursday June 11 2023 11:13:52 EDT Ventricular Rate:  93 PR Interval:  186 QRS Duration:  84 QT Interval:  374 QTC Calculation: 465 R Axis:   6  Text Interpretation: Normal sinus rhythm no acute ST/T changes Confirmed by Pricilla Loveless [phone]) on 06/11/2023 12:56:43 PM  Radiology No results found.  Procedures Procedures    Medications Ordered in ED Medications - No data to display  ED Course/ Medical Decision Making/ A&P                                 Medical Decision Making Amount and/or Complexity of Data Reviewed Labs: ordered.    Details: Hemoglobin improved from baseline.  Chronic kidney disease but no significant electrolyte disturbance ECG/medicine tests: ordered and independent interpretation performed.    Details: No ischemia   Patient presents with transient hypotension from dialysis.  They had given him back some fluid and now his blood pressures have been fine.  He is well-appearing.  He has no current  complaints.  Will discharge home with return precautions.  Low suspicion for ACS, emergent illness, etc.        Final Clinical Impression(s) / ED Diagnoses Final diagnoses:  Other specified hypotension    Rx / DC Orders ED Discharge Orders     None         Pricilla Loveless, MD 06/11/23 1558

## 2023-06-11 NOTE — ED Notes (Signed)
Report was called and given to Kathie Rhodes, Charity fundraiser at the [facility]. She stated that she would send someone over to pick pt up.

## 2023-06-12 ENCOUNTER — Encounter: Payer: Self-pay | Admitting: Adult Health

## 2023-06-12 ENCOUNTER — Non-Acute Institutional Stay: Payer: Self-pay | Admitting: Adult Health

## 2023-06-12 DIAGNOSIS — N2581 Secondary hyperparathyroidism of renal origin: Secondary | ICD-10-CM

## 2023-06-12 DIAGNOSIS — N186 End stage renal disease: Secondary | ICD-10-CM | POA: Diagnosis not present

## 2023-06-12 DIAGNOSIS — I700 Atherosclerosis of aorta: Secondary | ICD-10-CM

## 2023-06-12 DIAGNOSIS — E1122 Type 2 diabetes mellitus with diabetic chronic kidney disease: Secondary | ICD-10-CM

## 2023-06-12 DIAGNOSIS — Z992 Dependence on renal dialysis: Secondary | ICD-10-CM

## 2023-06-12 NOTE — Progress Notes (Signed)
Location:  [facility] Room Number: 112 Place of Service:  SNF (31)   CODE STATUS: full   No Known Allergies  Chief Complaint  Patient presents with   Acute Visit    Care plan meeting Follow up ED visit     HPI:  We have come together for his care plan meeting. BIMS 7/15 mood 0/30. He is ambulatory without falls. He is independent with his adl care and is continent of bladder and bowel. Dietary: feeds self; regular diet; no fluid restriction as he cannot adhere to it. Weight is 201.8 pounds. Therapy: none at this time. He continues to be followed for his chronic illnesses including:  Aortic atherosclerosis End stage renal disease on dialysis due to type 2 diabetes mellitus  Secondary hyperparathyroidism of renal origin He was at dialysis and experienced a hypotensive episode. His blood pressure did return back to baseline without further interventions.    Past Medical History:  Diagnosis Date   Abnormal CT scan, kidney 10/06/2011   Acute pyelonephritis 10/07/2011   Anemia    normocytic   Anxiety    mental retardation   Bladder wall thickening 10/06/2011   BPH (benign prostatic hypertrophy)    Diabetes mellitus    Dialysis patient (HCC)    Tuesday, Thursday and Saturday,    DVT of leg (deep venous thrombosis) (HCC) 12/25/2016   Edema     history of lower extremity edema   GERD (gastroesophageal reflux disease)    Heme positive stool    Hydronephrosis    Hyperkalemia    Hyperlipidemia    Hypernatremia    Hypertension    Hypothyroidism    Impaired speech    Infected prosthetic vascular graft (HCC)    MR (mental retardation)    Muscle weakness    Obstructive uropathy    Perinephric abscess 10/07/2011   Poor historian poor historian   Primary colorectal adenocarcinoma (HCC) 04/2017   S/p total colectomy   Protein calorie malnutrition (HCC)    Pyelonephritis    Renal failure (ARF), acute on chronic (HCC)    Renal insufficiency    chronic  history   Sepsis (HCC)    Smoking    Uremia    Urinary retention    UTI (lower urinary tract infection) 10/06/2011    Past Surgical History:  Procedure Laterality Date   A/V FISTULAGRAM N/A 08/13/2018   Procedure: A/V FISTULAGRAM - Right Upper;  Surgeon: Sherren Kerns, MD;  Location: [facility] INVASIVE CV LAB;  Service: Cardiovascular;  Laterality: N/A;   A/V FISTULAGRAM N/A 11/22/2018   Procedure: A/V FISTULAGRAM - Right Upper;  Surgeon: Maeola Harman, MD;  Location: [facility] INVASIVE CV LAB;  Service: Cardiovascular;  Laterality: N/A;   A/V FISTULAGRAM Left 10/08/2022   Procedure: A/V Fistulagram;  Surgeon: Victorino Sparrow, MD;  Location: [facility] INVASIVE CV LAB;  Service: Cardiovascular;  Laterality: Left;   A/V FISTULAGRAM Left 05/06/2023   Procedure: A/V Fistulagram;  Surgeon: Leonie Douglas, MD;  Location: [facility] INVASIVE CV LAB;  Service: Cardiovascular;  Laterality: Left;   AV FISTULA PLACEMENT Left 07/06/2015   Procedure:  INSERTION LEFT ARM ARTERIOVENOUS GORTEX GRAFT;  Surgeon: Chuck Hint, MD;  Location: [facility] OR;  Service: Vascular;  Laterality: Left;   AV FISTULA PLACEMENT Right 02/26/2016   Procedure: ARTERIOVENOUS (AV) FISTULA CREATION ;  Surgeon: Chuck Hint, MD;  Location: [facility] OR;  Service: Vascular;  Laterality: Right;   AV FISTULA PLACEMENT Right 11/25/2018  Procedure: INSERTION OF ARTERIOVENOUS (AV) ARTEGRAFT RIGHT UPPER ARM;  Surgeon: Maeola Harman, MD;  Location: [facility] OR;  Service: Vascular;  Laterality: Right;   AV FISTULA PLACEMENT Left 05/28/2021   Procedure: LEFT ARM ARTERIOVENOUS (AV) FISTULA;  Surgeon: Larina Earthly, MD;  Location: [facility] ORS;  Service: Vascular;  Laterality: Left;   AVGG REMOVAL Left 10/09/2015   Procedure: REMOVAL OF ARTERIOVENOUS GORETEX GRAFT (AVGG) Evacuation of Lymphocele, Vein Patch angioplasty of brachial artery.;  Surgeon: Chuck Hint, MD;  Location: [facility] OR;  Service: Vascular;  Laterality: Left;   BASCILIC  VEIN TRANSPOSITION Right 02/26/2016   Procedure: Right BASCILIC VEIN TRANSPOSITION;  Surgeon: Chuck Hint, MD;  Location: [facility] OR;  Service: Vascular;  Laterality: Right;   BASCILIC VEIN TRANSPOSITION Left 07/30/2021   Procedure: LEFT ARM SECOND STAGE BASILIC VEIN TRANSPOSITION;  Surgeon: Larina Earthly, MD;  Location: [facility] ORS;  Service: Vascular;  Laterality: Left;   CIRCUMCISION N/A 01/04/2014   Procedure: CIRCUMCISION ADULT (procedure #1);  Surgeon: Ky Barban, MD;  Location: [facility] ORS;  Service: Urology;  Laterality: N/A;   COLECTOMY N/A 05/04/2017   Procedure: TOTAL COLECTOMY;  Surgeon: Franky Macho, MD;  Location: [facility] ORS;  Service: General;  Laterality: N/A;   COLONOSCOPY N/A 04/27/2017   Surgeon: Corbin Ade, MD; annular mass in the ascending colon likely representing cancer biopsied, multiple 6-22 mm polyps removed, clean rectum.  Pathology with multiple tubular adenomas, high-grade dysplasia noted in ascending colon and splenic flexure.   CYSTOSCOPY W/ RETROGRADES Bilateral 06/29/2015   Procedure: CYSTOSCOPY, DILATION OF URETHRAL STRICTURE WITH BILATERAL RETROGRADE PYELOGRAM,SUPRAPUBIC TUBE CHANGE;  Surgeon: Jerilee Field, MD;  Location: [facility] ORS;  Service: Urology;  Laterality: Bilateral;   CYSTOSCOPY WITH URETHRAL DILATATION N/A 12/29/2013   Procedure: CYSTOSCOPY WITH URETHRAL DILATATION;  Surgeon: Ky Barban, MD;  Location: [facility] ORS;  Service: Urology;  Laterality: N/A;   ESOPHAGOGASTRODUODENOSCOPY N/A 04/27/2017   Procedure: ESOPHAGOGASTRODUODENOSCOPY (EGD);  Surgeon: Corbin Ade, MD;  Location: [facility];  Service: Endoscopy;  Laterality: N/A;   FLEXIBLE SIGMOIDOSCOPY N/A 12/02/2021   Procedure: FLEXIBLE SIGMOIDOSCOPY;  Surgeon: Lanelle Bal, DO;  Location: [facility];  Service: Endoscopy;  Laterality: N/A;  10:30am, dialysis patient   INSERTION OF DIALYSIS CATHETER Right 11/25/2018   Procedure: INSERTION OF DIALYSIS CATHETER RIGHT INTERNAL JUGULAR;   Surgeon: Maeola Harman, MD;  Location: [facility] OR;  Service: Vascular;  Laterality: Right;   IR AV DIALY SHUNT INTRO NEEDLE/INTRACATH INITIAL W/PTA/IMG RIGHT Right 09/07/2018   IR FLUORO GUIDE CV LINE RIGHT  10/16/2020   IR FLUORO GUIDE CV LINE RIGHT  10/03/2022   IR REMOVAL TUN CV CATH W/O FL  01/12/2019   IR THROMBECTOMY AV FISTULA W/THROMBOLYSIS/PTA INC/SHUNT/IMG RIGHT Right 04/26/2018   IR US GUIDE VASC ACCESS RIGHT  04/26/2018   IR US GUIDE VASC ACCESS RIGHT  09/07/2018   IR US GUIDE VASC ACCESS RIGHT  10/16/2020   IR US GUIDE VASC ACCESS RIGHT  10/16/2020   IR US GUIDE VASC ACCESS RIGHT  10/03/2022   ORIF FEMUR FRACTURE Right 11/22/2016   Procedure: OPEN REDUCTION INTERNAL FIXATION (ORIF) DISTAL FEMUR FRACTURE;  Surgeon: Samson Frederic, MD;  Location: [facility] OR;  Service: Orthopedics;  Laterality: Right;   PATCH ANGIOPLASTY Right 12/10/2017   Procedure: PATCH ANGIOPLASTY;  Surgeon: Chuck Hint, MD;  Location: [facility] OR;  Service: Vascular;  Laterality: Right;   PERIPHERAL VASCULAR BALLOON ANGIOPLASTY  08/13/2018   Procedure: PERIPHERAL VASCULAR BALLOON ANGIOPLASTY;  Surgeon:  Sherren Kerns, MD;  Location: [facility] INVASIVE CV LAB;  Service: Cardiovascular;;  right AV fistula    PERIPHERAL VASCULAR BALLOON ANGIOPLASTY  11/22/2018   Procedure: PERIPHERAL VASCULAR BALLOON ANGIOPLASTY;  Surgeon: Maeola Harman, MD;  Location: [facility] INVASIVE CV LAB;  Service: Cardiovascular;;  rt AV fistula   PERIPHERAL VASCULAR BALLOON ANGIOPLASTY  10/08/2022   Procedure: PERIPHERAL VASCULAR BALLOON ANGIOPLASTY;  Surgeon: Victorino Sparrow, MD;  Location: [facility] INVASIVE CV LAB;  Service: Cardiovascular;;  arterial anastamosis   PERIPHERAL VASCULAR CATHETERIZATION N/A 10/08/2015   Procedure: A/V Shuntogram;  Surgeon: Chuck Hint, MD;  Location: [facility] INVASIVE CV LAB;  Service: Cardiovascular;  Laterality: N/A;   PERIPHERAL VASCULAR INTERVENTION Left 10/08/2022   Procedure: PERIPHERAL  VASCULAR INTERVENTION;  Surgeon: Victorino Sparrow, MD;  Location: [facility] INVASIVE CV LAB;  Service: Cardiovascular;  Laterality: Left;  left AVF   REMOVAL OF A DIALYSIS CATHETER N/A 12/03/2021   Procedure: MINOR REMOVAL OF A TUNNELED DIALYSIS CATHETER;  Surgeon: Larina Earthly, MD;  Location: [facility] ORS;  Service: Vascular;  Laterality: N/A;   REMOVAL OF A DIALYSIS CATHETER N/A 11/18/2022   Procedure: MINOR REMOVAL OF A TUNNELED DIALYSIS CATHETER;  Surgeon: Larina Earthly, MD;  Location: [facility]S;  Service: Vascular;  Laterality: N/A;   THROMBECTOMY W/ EMBOLECTOMY Right 12/10/2017   Procedure: THROMBECTOMY REVISION RIGHT ARM  ARTERIOVENOUS FISTULA;  Surgeon: Chuck Hint, MD;  Location: [facility] OR;  Service: Vascular;  Laterality: Right;   TRANSURETHRAL RESECTION OF PROSTATE N/A 01/04/2014   Procedure: TRANSURETHRAL RESECTION OF THE PROSTATE (TURP) (procedure #2);  Surgeon: Ky Barban, MD;  Location: [facility] ORS;  Service: Urology;  Laterality: N/A;    Social History   Socioeconomic History   Marital status: Single    Spouse name: Not on file   Number of children: Not on file   Years of education: Not on file   Highest education level: Not on file  Occupational History   Occupation: retired   Tobacco Use   Smoking status: Never   Smokeless tobacco: Never  Vaping Use   Vaping status: Never Used  Substance and Sexual Activity   Alcohol use: No   Drug use: No   Sexual activity: Not Currently  Other Topics Concern   Not on file  Social History Narrative   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   Long term resident of [facility]    Social Determinants of Health   Financial Resource Strain: Low Risk  (02/18/2018)   Overall Financial Resource Strain (CARDIA)    Difficulty of Paying Living Expenses: Not hard at all  Food Insecurity: No Food Insecurity (02/18/2018)   Hunger Vital Sign    Worried About Running Out of Food in the Last Year: Never true    Ran Out  of Food in the Last Year: Never true  Transportation Needs: No Transportation Needs (02/18/2018)   PRAPARE - Administrator, Civil Service (Medical): No    Lack of Transportation (Non-Medical): No  Physical Activity: Unknown (09/15/2019)   Exercise Vital Sign    Days of Exercise per Week: Not on file    Minutes of Exercise per Session: 0 min  Stress: No Stress Concern Present (02/18/2018)   [employer] of Occupational Health - Occupational Stress Questionnaire    Feeling of Stress : Not at all  Social Connections: Unknown (09/15/2019)   Social Connection and Isolation Panel [NHANES]    Frequency of Communication with Friends and  Family: Not on file    Frequency of Social Gatherings with Friends and Family: Never    Attends Religious Services: Not on file    Active Member of Clubs or Organizations: Not on file    Attends Banker Meetings: Not on file    Marital Status: Not on file  Intimate Partner Violence: Not At Risk (02/18/2018)   Humiliation, Afraid, Rape, and Kick questionnaire    Fear of Current or Ex-Partner: No    Emotionally Abused: No    Physically Abused: No    Sexually Abused: No   Family History  Problem Relation Age of Onset   Cancer Mother    Colon cancer Neg Hx       VITAL SIGNS BP (!) 109/52   Pulse 96   Temp 98.3 F (36.8 C)   Resp 20   Ht 5\' 8"  (1.727 m)   Wt 201 lb 12.8 oz (91.5 kg)   SpO2 97%   BMI 30.68 kg/m   Outpatient Encounter Medications as of 06/12/2023  Medication Sig   aspirin EC 81 MG tablet Take 81 mg by mouth daily. 0900   atorvastatin (LIPITOR) 20 MG tablet Take 20 mg by mouth at bedtime. 2200   Insulin Glargine (BASAGLAR KWIKPEN) 100 UNIT/ML Inject 8 Units into the skin at bedtime. 2200   Insulin Pen Needle (BD AUTOSHIELD DUO) 30G X 5 MM MISC by Does not apply route. 3/16"   levothyroxine (SYNTHROID) 125 MCG tablet Take 125 mcg by mouth at bedtime. 2200   midodrine (PROAMATINE) 10 MG tablet Take 10 mg  by mouth See admin instructions. Send med with resident on  Tues/Thurs/Sat to dialysis. do not give at [facility], give to resident to take with him. send with 5mg  to equal 15mg . 6:00   midodrine (PROAMATINE) 5 MG tablet Take 5 mg by mouth See admin instructions. Send with resident on dialysis days.  Tues/Thurs/Sat  Take along with 10 mg to equal 15 mg 6:00   NON FORMULARY Diet: Regular diet   Nutritional Supplements (,FEEDING SUPPLEMENT, PROSOURCE PLUS) liquid Take 30 mLs by mouth 2 (two) times daily between meals. 1600 and 2200   omega-3 acid ethyl esters (LOVAZA) 1 g capsule Take 2 g by mouth at bedtime. 2200   omeprazole (PRILOSEC) 40 MG capsule Take 40 mg by mouth at bedtime. 2200   polyethylene glycol (MIRALAX / GLYCOLAX) packet Take 17 g by mouth daily as needed for mild constipation.    sevelamer carbonate (RENVELA) 800 MG tablet Take 3 tablets (2,400 mg total) by mouth 3 (three) times daily with meals.   tamsulosin (FLOMAX) 0.4 MG CAPS capsule Take 0.4 mg by mouth at bedtime. Give 30 minutes after a meal. Do not crush or chew   torsemide (DEMADEX) 20 MG tablet Take 20 mg by mouth See admin instructions. 20mg  daily at 2000 on Sunday, Monday, Wednesday, and Friday only.   vitamin B-12 (CYANOCOBALAMIN) 1000 MCG tablet Take 1,000 mcg by mouth at bedtime.   No facility-administered encounter medications on file as of 06/12/2023.     SIGNIFICANT DIAGNOSTIC EXAMS  PREVIOUS   06-11-22: dexa: t score: -2.052  NO NEW EXAMS.    LABS REVIEWED PREVIOUS;      06-19-22: hgb a1c 5.8; tsh 2.539 free t4: 0.98 09-29-22: wbc 4.3; hgb 12.9; hct 41.1; mcv 108.4 plt 108; glucose 112; bun 44; creat 16.87; k+ 4.6; na++ 139; ca 9.3 gfr 3; protein 7.0; albumin 3.5; hgb A1c 5.0; tsh 5.861 chol 89;  ldl 41; trig 68; hdl 35 10-08-22: glucose 101; bun 22; creat 12.30; k+ 5.2; na++ 141; ion ca 1.14;  02-13-23: wbc 3.8; hgb 9.7; hct 28.7; mcv 101.1 plt 126; glucose 67; bun 18; creat 6.28; k+ 2.9; na++ 137; ca 8.8;  gfr 9; protein 6.9; albumin 3.2 mag 1.8; occult blood: neg 02-23-23: hgb 9.8; hct 29.7 glucose 110; bun 24; creat 8.32 k+ 3.6; na++ 139; ca 9.6 gfr 6   TODAY  06-11-23: wbc 4.4; hgb 11.7; hct 35.2; mcv 102.3 plt 88; glucose 160; bun 20; creat 4.71; k+ 3.5; na++ 137; ca 8.4; gfr 12     Review of Systems  Constitutional:  Negative for malaise/fatigue.  Respiratory:  Negative for cough and shortness of breath.   Cardiovascular:  Negative for chest pain, palpitations and leg swelling.  Gastrointestinal:  Negative for abdominal pain, constipation and heartburn.  Musculoskeletal:  Negative for back pain, joint pain and myalgias.  Skin: Negative.   Neurological:  Negative for dizziness.  Psychiatric/Behavioral:  The patient is not nervous/anxious.     Physical Exam Constitutional:      General: He is not in acute distress.    Appearance: He is well-developed. He is not diaphoretic.  Neck:     Thyroid: No thyromegaly.  Cardiovascular:     Rate and Rhythm: Normal rate and regular rhythm.     Pulses: Normal pulses.     Heart sounds: Normal heart sounds.  Pulmonary:     Effort: Pulmonary effort is normal. No respiratory distress.     Breath sounds: Normal breath sounds.  Abdominal:     General: Bowel sounds are normal. There is no distension.     Palpations: Abdomen is soft.     Tenderness: There is no abdominal tenderness.  Musculoskeletal:        General: Normal range of motion.     Cervical back: Neck supple.     Right lower leg: No edema.     Left lower leg: No edema.  Lymphadenopathy:     Cervical: No cervical adenopathy.  Skin:    General: Skin is warm and dry.     Comments:  Left arm fistula +thrill+bruit       Neurological:     Mental Status: He is alert. Mental status is at baseline.     Comments: 02-27-23: SLUMS 2/30 Stutters   Psychiatric:        Mood and Affect: Mood normal.       ASSESSMENT/ PLAN:  TODAY  Aortic atherosclerosis End stage renal disease on  dialysis due to type 2 diabetes mellitus Secondary hyperparathyroidism of renal origin   Will continue current medications Will continue current plan of care Will continue to monitor his status.   Time spent with patient: 40 minutes: medications; review ED visit; dietary.    Synthia Innocent NP [facility]   call [phone]

## 2023-06-13 ENCOUNTER — Encounter (HOSPITAL_COMMUNITY)
Admission: RE | Admit: 2023-06-13 | Discharge: 2023-06-13 | Disposition: A | Discharge status: Home / Self Care | Payer: Medicare Other | Source: Skilled Nursing Facility | Attending: Internal Medicine | Admitting: Internal Medicine

## 2023-06-13 DIAGNOSIS — Z992 Dependence on renal dialysis: Secondary | ICD-10-CM | POA: Diagnosis not present

## 2023-06-13 DIAGNOSIS — Z20828 Contact with and (suspected) exposure to other viral communicable diseases: Secondary | ICD-10-CM | POA: Diagnosis not present

## 2023-06-13 DIAGNOSIS — D631 Anemia in chronic kidney disease: Secondary | ICD-10-CM | POA: Diagnosis not present

## 2023-06-13 DIAGNOSIS — D509 Iron deficiency anemia, unspecified: Secondary | ICD-10-CM | POA: Diagnosis not present

## 2023-06-13 DIAGNOSIS — U071 COVID-19: Secondary | ICD-10-CM | POA: Diagnosis not present

## 2023-06-13 DIAGNOSIS — Q791 Other congenital malformations of diaphragm: Secondary | ICD-10-CM | POA: Diagnosis not present

## 2023-06-13 DIAGNOSIS — N186 End stage renal disease: Secondary | ICD-10-CM | POA: Diagnosis not present

## 2023-06-13 DIAGNOSIS — N250 Renal osteodystrophy: Secondary | ICD-10-CM | POA: Diagnosis not present

## 2023-06-13 DIAGNOSIS — N2581 Secondary hyperparathyroidism of renal origin: Secondary | ICD-10-CM | POA: Diagnosis not present

## 2023-06-13 LAB — CBC
HCT: 39.2 % (ref 39.0–52.0)
Hemoglobin: 13 g/dL (ref 13.0–17.0)
MCH: 34 pg (ref 26.0–34.0)
MCHC: 33.2 g/dL (ref 30.0–36.0)
MCV: 102.6 fL — ABNORMAL HIGH (ref 80.0–100.0)
Platelets: 110 10*3/uL — ABNORMAL LOW (ref 150–400)
RBC: 3.82 MIL/uL — ABNORMAL LOW (ref 4.22–5.81)
RDW: 14 % (ref 11.5–15.5)
WBC: 3.6 10*3/uL — ABNORMAL LOW (ref 4.0–10.5)
nRBC: 0 % (ref 0.0–0.2)

## 2023-06-13 LAB — BASIC METABOLIC PANEL
Anion gap: 11 (ref 5–15)
BUN: 11 mg/dL (ref 8–23)
CO2: 30 mmol/L (ref 22–32)
Calcium: 8.6 mg/dL — ABNORMAL LOW (ref 8.9–10.3)
Chloride: 97 mmol/L — ABNORMAL LOW (ref 98–111)
Creatinine, Ser: 4.13 mg/dL — ABNORMAL HIGH (ref 0.61–1.24)
GFR, Estimated: 14 mL/min — ABNORMAL LOW (ref >60)
Glucose, Bld: 103 mg/dL — ABNORMAL HIGH (ref 70–99)
Potassium: 3.3 mmol/L — ABNORMAL LOW (ref 3.5–5.1)
Sodium: 138 mmol/L (ref 135–145)

## 2023-06-13 LAB — C-REACTIVE PROTEIN: CRP: <0.5 mg/dL (ref <1.0)

## 2023-06-13 LAB — D-DIMER, QUANTITATIVE: D-Dimer, Quant: 0.54 ug{FEU}/mL — ABNORMAL HIGH (ref 0.00–0.50)

## 2023-06-15 ENCOUNTER — Non-Acute Institutional Stay (SKILLED_NURSING_FACILITY): Payer: Medicare Other | Admitting: Adult Health

## 2023-06-15 ENCOUNTER — Encounter: Payer: Self-pay | Admitting: Adult Health

## 2023-06-15 DIAGNOSIS — U071 COVID-19: Secondary | ICD-10-CM | POA: Diagnosis not present

## 2023-06-15 DIAGNOSIS — E119 Type 2 diabetes mellitus without complications: Secondary | ICD-10-CM | POA: Diagnosis not present

## 2023-06-15 NOTE — Progress Notes (Unsigned)
Location:  [facility] Room Number: 112 Place of Service:  SNF (31)   CODE STATUS: full   No Known Allergies  Chief Complaint  Patient presents with   Acute Visit    COVID +     HPI:  He has tested positive for covid over this past weekend. His chest x-ray was normal; his lab work was normal. There are no reports of fevers; no cough; no shortness of breath.   Past Medical History:  Diagnosis Date   Abnormal CT scan, kidney 10/06/2011   Acute pyelonephritis 10/07/2011   Anemia    normocytic   Anxiety    mental retardation   Bladder wall thickening 10/06/2011   BPH (benign prostatic hypertrophy)    Diabetes mellitus    Dialysis patient (HCC)    Tuesday, Thursday and Saturday,    DVT of leg (deep venous thrombosis) (HCC) 12/25/2016   Edema     history of lower extremity edema   GERD (gastroesophageal reflux disease)    Heme positive stool    Hydronephrosis    Hyperkalemia    Hyperlipidemia    Hypernatremia    Hypertension    Hypothyroidism    Impaired speech    Infected prosthetic vascular graft (HCC)    MR (mental retardation)    Muscle weakness    Obstructive uropathy    Perinephric abscess 10/07/2011   Poor historian poor historian   Primary colorectal adenocarcinoma (HCC) 04/2017   S/p total colectomy   Protein calorie malnutrition (HCC)    Pyelonephritis    Renal failure (ARF), acute on chronic (HCC)    Renal insufficiency    chronic history   Sepsis (HCC)    Smoking    Uremia    Urinary retention    UTI (lower urinary tract infection) 10/06/2011    Past Surgical History:  Procedure Laterality Date   A/V FISTULAGRAM N/A 08/13/2018   Procedure: A/V FISTULAGRAM - Right Upper;  Surgeon: Sherren Kerns, MD;  Location: [facility] INVASIVE CV LAB;  Service: Cardiovascular;  Laterality: N/A;   A/V FISTULAGRAM N/A 11/22/2018   Procedure: A/V FISTULAGRAM - Right Upper;  Surgeon: Maeola Harman, MD;  Location: [facility] INVASIVE CV  LAB;  Service: Cardiovascular;  Laterality: N/A;   A/V FISTULAGRAM Left 10/08/2022   Procedure: A/V Fistulagram;  Surgeon: Victorino Sparrow, MD;  Location: [facility] INVASIVE CV LAB;  Service: Cardiovascular;  Laterality: Left;   A/V FISTULAGRAM Left 05/06/2023   Procedure: A/V Fistulagram;  Surgeon: Leonie Douglas, MD;  Location: [facility] INVASIVE CV LAB;  Service: Cardiovascular;  Laterality: Left;   AV FISTULA PLACEMENT Left 07/06/2015   Procedure:  INSERTION LEFT ARM ARTERIOVENOUS GORTEX GRAFT;  Surgeon: Chuck Hint, MD;  Location: [facility] OR;  Service: Vascular;  Laterality: Left;   AV FISTULA PLACEMENT Right 02/26/2016   Procedure: ARTERIOVENOUS (AV) FISTULA CREATION ;  Surgeon: Chuck Hint, MD;  Location: [facility] OR;  Service: Vascular;  Laterality: Right;   AV FISTULA PLACEMENT Right 11/25/2018   Procedure: INSERTION OF ARTERIOVENOUS (AV) ARTEGRAFT RIGHT UPPER ARM;  Surgeon: Maeola Harman, MD;  Location: [facility] OR;  Service: Vascular;  Laterality: Right;   AV FISTULA PLACEMENT Left 05/28/2021   Procedure: LEFT ARM ARTERIOVENOUS (AV) FISTULA;  Surgeon: Larina Earthly, MD;  Location: [facility] ORS;  Service: Vascular;  Laterality: Left;   AVGG REMOVAL Left 10/09/2015   Procedure: REMOVAL OF ARTERIOVENOUS GORETEX GRAFT (AVGG) Evacuation of Lymphocele, Vein Patch angioplasty of brachial artery.;  Surgeon: Chuck Hint, MD;  Location: [facility] OR;  Service: Vascular;  Laterality: Left;   BASCILIC VEIN TRANSPOSITION Right 02/26/2016   Procedure: Right BASCILIC VEIN TRANSPOSITION;  Surgeon: Chuck Hint, MD;  Location: [facility] OR;  Service: Vascular;  Laterality: Right;   BASCILIC VEIN TRANSPOSITION Left 07/30/2021   Procedure: LEFT ARM SECOND STAGE BASILIC VEIN TRANSPOSITION;  Surgeon: Larina Earthly, MD;  Location: [facility] ORS;  Service: Vascular;  Laterality: Left;   CIRCUMCISION N/A 01/04/2014   Procedure: CIRCUMCISION ADULT (procedure #1);  Surgeon: Ky Barban, MD;  Location: [facility] ORS;   Service: Urology;  Laterality: N/A;   COLECTOMY N/A 05/04/2017   Procedure: TOTAL COLECTOMY;  Surgeon: Franky Macho, MD;  Location: [facility] ORS;  Service: General;  Laterality: N/A;   COLONOSCOPY N/A 04/27/2017   Surgeon: Corbin Ade, MD; annular mass in the ascending colon likely representing cancer biopsied, multiple 6-22 mm polyps removed, clean rectum.  Pathology with multiple tubular adenomas, high-grade dysplasia noted in ascending colon and splenic flexure.   CYSTOSCOPY W/ RETROGRADES Bilateral 06/29/2015   Procedure: CYSTOSCOPY, DILATION OF URETHRAL STRICTURE WITH BILATERAL RETROGRADE PYELOGRAM,SUPRAPUBIC TUBE CHANGE;  Surgeon: Jerilee Field, MD;  Location: [facility] ORS;  Service: Urology;  Laterality: Bilateral;   CYSTOSCOPY WITH URETHRAL DILATATION N/A 12/29/2013   Procedure: CYSTOSCOPY WITH URETHRAL DILATATION;  Surgeon: Ky Barban, MD;  Location: [facility] ORS;  Service: Urology;  Laterality: N/A;   ESOPHAGOGASTRODUODENOSCOPY N/A 04/27/2017   Procedure: ESOPHAGOGASTRODUODENOSCOPY (EGD);  Surgeon: Corbin Ade, MD;  Location: [facility];  Service: Endoscopy;  Laterality: N/A;   FLEXIBLE SIGMOIDOSCOPY N/A 12/02/2021   Procedure: FLEXIBLE SIGMOIDOSCOPY;  Surgeon: Lanelle Bal, DO;  Location: [facility] END[facility];  Service: Endoscopy;  Laterality: N/A;  10:30am, dialysis patient   INSERTION OF DIALYSIS CATHETER Right 11/25/2018   Procedure: INSERTION OF DIALYSIS CATHETER RIGHT INTERNAL JUGULAR;  Surgeon: Maeola Harman, MD;  Location: [facility] OR;  Service: Vascular;  Laterality: Right;   IR AV DIALY SHUNT INTRO NEEDLE/INTRACATH INITIAL W/PTA/IMG RIGHT Right 09/07/2018   IR FLUORO GUIDE CV LINE RIGHT  10/16/2020   IR FLUORO GUIDE CV LINE RIGHT  10/03/2022   IR REMOVAL TUN CV CATH W/O FL  01/12/2019   IR THROMBECTOMY AV FISTULA W/THROMBOLYSIS/PTA INC/SHUNT/IMG RIGHT Right 04/26/2018   IR US GUIDE VASC ACCESS RIGHT  04/26/2018   IR US GUIDE VASC ACCESS RIGHT  09/07/2018   IR US GUIDE  VASC ACCESS RIGHT  10/16/2020   IR US GUIDE VASC ACCESS RIGHT  10/16/2020   IR US GUIDE VASC ACCESS RIGHT  10/03/2022   ORIF FEMUR FRACTURE Right 11/22/2016   Procedure: OPEN REDUCTION INTERNAL FIXATION (ORIF) DISTAL FEMUR FRACTURE;  Surgeon: Samson Frederic, MD;  Location: [facility] OR;  Service: Orthopedics;  Laterality: Right;   PATCH ANGIOPLASTY Right 12/10/2017   Procedure: PATCH ANGIOPLASTY;  Surgeon: Chuck Hint, MD;  Location: [facility] OR;  Service: Vascular;  Laterality: Right;   PERIPHERAL VASCULAR BALLOON ANGIOPLASTY  08/13/2018   Procedure: PERIPHERAL VASCULAR BALLOON ANGIOPLASTY;  Surgeon: Sherren Kerns, MD;  Location: [facility] INVASIVE CV LAB;  Service: Cardiovascular;;  right AV fistula    PERIPHERAL VASCULAR BALLOON ANGIOPLASTY  11/22/2018   Procedure: PERIPHERAL VASCULAR BALLOON ANGIOPLASTY;  Surgeon: Maeola Harman, MD;  Location: [facility] INVASIVE CV LAB;  Service: Cardiovascular;;  rt AV fistula   PERIPHERAL VASCULAR BALLOON ANGIOPLASTY  10/08/2022   Procedure: PERIPHERAL VASCULAR BALLOON ANGIOPLASTY;  Surgeon: Victorino Sparrow, MD;  Location: [facility] INVASIVE CV LAB;  Service:  Cardiovascular;;  arterial anastamosis   PERIPHERAL VASCULAR CATHETERIZATION N/A 10/08/2015   Procedure: A/V Shuntogram;  Surgeon: Chuck Hint, MD;  Location: [facility] INVASIVE CV LAB;  Service: Cardiovascular;  Laterality: N/A;   PERIPHERAL VASCULAR INTERVENTION Left 10/08/2022   Procedure: PERIPHERAL VASCULAR INTERVENTION;  Surgeon: Victorino Sparrow, MD;  Location: [facility] INVASIVE CV LAB;  Service: Cardiovascular;  Laterality: Left;  left AVF   REMOVAL OF A DIALYSIS CATHETER N/A 12/03/2021   Procedure: MINOR REMOVAL OF A TUNNELED DIALYSIS CATHETER;  Surgeon: Larina Earthly, MD;  Location: [facility]S;  Service: Vascular;  Laterality: N/A;   REMOVAL OF A DIALYSIS CATHETER N/A 11/18/2022   Procedure: MINOR REMOVAL OF A TUNNELED DIALYSIS CATHETER;  Surgeon: Larina Earthly, MD;  Location: [facility];  Service: Vascular;   Laterality: N/A;   THROMBECTOMY W/ EMBOLECTOMY Right 12/10/2017   Procedure: THROMBECTOMY REVISION RIGHT ARM  ARTERIOVENOUS FISTULA;  Surgeon: Chuck Hint, MD;  Location: [facility] OR;  Service: Vascular;  Laterality: Right;   TRANSURETHRAL RESECTION OF PROSTATE N/A 01/04/2014   Procedure: TRANSURETHRAL RESECTION OF THE PROSTATE (TURP) (procedure #2);  Surgeon: Ky Barban, MD;  Location: [facility];  Service: Urology;  Laterality: N/A;    Social History   Socioeconomic History   Marital status: Single    Spouse name: Not on file   Number of children: Not on file   Years of education: Not on file   Highest education level: Not on file  Occupational History   Occupation: retired   Tobacco Use   Smoking status: Never   Smokeless tobacco: Never  Vaping Use   Vaping status: Never Used  Substance and Sexual Activity   Alcohol use: No   Drug use: No   Sexual activity: Not Currently  Other Topics Concern   Not on file  Social History Narrative   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   Long term resident of [facility]    Social Determinants of Health   Financial Resource Strain: Low Risk  (02/18/2018)   Overall Financial Resource Strain (CARDIA)    Difficulty of Paying Living Expenses: Not hard at all  Food Insecurity: No Food Insecurity (02/18/2018)   Hunger Vital Sign    Worried About Running Out of Food in the Last Year: Never true    Ran Out of Food in the Last Year: Never true  Transportation Needs: No Transportation Needs (02/18/2018)   PRAPARE - Administrator, Civil Service (Medical): No    Lack of Transportation (Non-Medical): No  Physical Activity: Unknown (09/15/2019)   Exercise Vital Sign    Days of Exercise per Week: Not on file    Minutes of Exercise per Session: 0 min  Stress: No Stress Concern Present (02/18/2018)   [employer] of Occupational Health - Occupational Stress Questionnaire    Feeling of Stress :  Not at all  Social Connections: Unknown (09/15/2019)   Social Connection and Isolation Panel [NHANES]    Frequency of Communication with Friends and Family: Not on file    Frequency of Social Gatherings with Friends and Family: Never    Attends Religious Services: Not on file    Active Member of Clubs or Organizations: Not on file    Attends Banker Meetings: Not on file    Marital Status: Not on file  Intimate Partner Violence: Not At Risk (02/18/2018)   Humiliation, Afraid, Rape, and Kick questionnaire    Fear of Current or Ex-Partner: No  Emotionally Abused: No    Physically Abused: No    Sexually Abused: No   Family History  Problem Relation Age of Onset   Cancer Mother    Colon cancer Neg Hx       VITAL SIGNS BP 114/68   Pulse 74   Temp (!) 97.4 F (36.3 C)   Resp 20   Ht 5\' 8"  (1.727 m)   Wt 201 lb 12.8 oz (91.5 kg)   SpO2 97%   BMI 30.68 kg/m   Outpatient Encounter Medications as of 06/15/2023  Medication Sig   aspirin EC 81 MG tablet Take 81 mg by mouth daily. 0900   atorvastatin (LIPITOR) 20 MG tablet Take 20 mg by mouth at bedtime. 2200   Insulin Glargine (BASAGLAR KWIKPEN) 100 UNIT/ML Inject 8 Units into the skin at bedtime. 2200   Insulin Pen Needle (BD AUTOSHIELD DUO) 30G X 5 MM MISC by Does not apply route. 3/16"   levothyroxine (SYNTHROID) 125 MCG tablet Take 125 mcg by mouth at bedtime. 2200   midodrine (PROAMATINE) 10 MG tablet Take 10 mg by mouth See admin instructions. Send med with resident on  Tues/Thurs/Sat to dialysis. do not give at [facility], give to resident to take with him. send with 5mg  to equal 15mg . 6:00   midodrine (PROAMATINE) 5 MG tablet Take 5 mg by mouth See admin instructions. Send with resident on dialysis days.  Tues/Thurs/Sat  Take along with 10 mg to equal 15 mg 6:00   NON FORMULARY Diet: Regular diet   Nutritional Supplements (,FEEDING SUPPLEMENT, PROSOURCE PLUS) liquid Take 30 mLs by mouth 2 (two) times daily  between meals. 1600 and 2200   omega-3 acid ethyl esters (LOVAZA) 1 g capsule Take 2 g by mouth at bedtime. 2200   omeprazole (PRILOSEC) 40 MG capsule Take 40 mg by mouth at bedtime. 2200   polyethylene glycol (MIRALAX / GLYCOLAX) packet Take 17 g by mouth daily as needed for mild constipation.    sevelamer carbonate (RENVELA) 800 MG tablet Take 3 tablets (2,400 mg total) by mouth 3 (three) times daily with meals.   tamsulosin (FLOMAX) 0.4 MG CAPS capsule Take 0.4 mg by mouth at bedtime. Give 30 minutes after a meal. Do not crush or chew   torsemide (DEMADEX) 20 MG tablet Take 20 mg by mouth See admin instructions. 20mg  daily at 2000 on Sunday, Monday, Wednesday, and Friday only.   vitamin B-12 (CYANOCOBALAMIN) 1000 MCG tablet Take 1,000 mcg by mouth at bedtime.   No facility-administered encounter medications on file as of 06/15/2023.     SIGNIFICANT DIAGNOSTIC EXAMS  PREVIOUS   06-11-22: dexa: t score: -2.052  NO NEW EXAMS.    LABS REVIEWED PREVIOUS;      06-19-22: hgb a1c 5.8; tsh 2.539 free t4: 0.98 09-29-22: wbc 4.3; hgb 12.9; hct 41.1; mcv 108.4 plt 108; glucose 112; bun 44; creat 16.87; k+ 4.6; na++ 139; ca 9.3 gfr 3; protein 7.0; albumin 3.5; hgb A1c 5.0; tsh 5.861 chol 89; ldl 41; trig 68; hdl 35 10-08-22: glucose 101; bun 22; creat 12.30; k+ 5.2; na++ 141; ion ca 1.14;  02-13-23: wbc 3.8; hgb 9.7; hct 28.7; mcv 101.1 plt 126; glucose 67; bun 18; creat 6.28; k+ 2.9; na++ 137; ca 8.8; gfr 9; protein 6.9; albumin 3.2 mag 1.8; occult blood: neg 02-23-23: hgb 9.8; hct 29.7 glucose 110; bun 24; creat 8.32 k+ 3.6; na++ 139; ca 9.6 gfr 6   TODAY  06-11-23: wbc 4.4; hgb 11.7; hct  35.2; mcv 102.3 plt 88; glucose 160; bun 20; creat 4.71; k+ 3.5; na++ 137; ca 8.4; gfr 12   06-13-23: wbc 3.6; hgb 13.0; hct 39.2; mcv 102.6 plt 110; glucose 103; bun 11; creat 4.13; k+ 3.3; na++ 138; ca 8.6; gfr 14; d-dimer 0.54; CRP <0.5   Review of Systems  Constitutional:  Negative for malaise/fatigue.   Respiratory:  Negative for cough and shortness of breath.   Cardiovascular:  Negative for chest pain, palpitations and leg swelling.  Gastrointestinal:  Negative for abdominal pain, constipation and heartburn.  Musculoskeletal:  Negative for back pain, joint pain and myalgias.  Skin: Negative.   Neurological:  Negative for dizziness.  Psychiatric/Behavioral:  The patient is not nervous/anxious.     Physical Exam Constitutional:      General: He is not in acute distress.    Appearance: He is well-developed. He is not diaphoretic.  Neck:     Thyroid: No thyromegaly.  Cardiovascular:     Rate and Rhythm: Normal rate and regular rhythm.     Pulses: Normal pulses.     Heart sounds: Normal heart sounds.  Pulmonary:     Effort: Pulmonary effort is normal. No respiratory distress.     Breath sounds: Normal breath sounds.  Abdominal:     General: Bowel sounds are normal. There is no distension.     Palpations: Abdomen is soft.     Tenderness: There is no abdominal tenderness.  Musculoskeletal:        General: Normal range of motion.     Cervical back: Neck supple.  Lymphadenopathy:     Cervical: No cervical adenopathy.  Skin:    General: Skin is warm and dry.     Comments: Left arm fistula +thrill+bruit       Neurological:     Mental Status: He is alert. Mental status is at baseline.     Comments: 02-27-23: SLUMS 2/30  Psychiatric:        Mood and Affect: Mood normal.       ASSESSMENT/ PLAN:  TODAY  COVID 19 with comorbid type 2 diabetes mellitus: will complete molnupiravir therapy and will monitor his status.    Synthia Innocent NP [facility] Adult Medicine  call [phone]

## 2023-06-16 ENCOUNTER — Emergency Department (HOSPITAL_COMMUNITY)
Admission: EM | Admit: 2023-06-16 | Discharge: 2023-06-17 | Disposition: A | Discharge status: Skilled Nursing Facility | Payer: Medicare Other | Attending: Emergency Medicine | Admitting: Emergency Medicine

## 2023-06-16 ENCOUNTER — Encounter (HOSPITAL_COMMUNITY): Payer: Self-pay

## 2023-06-16 ENCOUNTER — Other Ambulatory Visit: Payer: Self-pay

## 2023-06-16 DIAGNOSIS — R531 Weakness: Secondary | ICD-10-CM | POA: Insufficient documentation

## 2023-06-16 DIAGNOSIS — Z7982 Long term (current) use of aspirin: Secondary | ICD-10-CM | POA: Insufficient documentation

## 2023-06-16 DIAGNOSIS — Z452 Encounter for adjustment and management of vascular access device: Secondary | ICD-10-CM | POA: Diagnosis not present

## 2023-06-16 DIAGNOSIS — Z794 Long term (current) use of insulin: Secondary | ICD-10-CM | POA: Insufficient documentation

## 2023-06-16 DIAGNOSIS — Z789 Other specified health status: Secondary | ICD-10-CM

## 2023-06-16 DIAGNOSIS — Z992 Dependence on renal dialysis: Secondary | ICD-10-CM | POA: Insufficient documentation

## 2023-06-16 DIAGNOSIS — N186 End stage renal disease: Secondary | ICD-10-CM | POA: Insufficient documentation

## 2023-06-16 DIAGNOSIS — I120 Hypertensive chronic kidney disease with stage 5 chronic kidney disease or end stage renal disease: Secondary | ICD-10-CM | POA: Diagnosis not present

## 2023-06-16 LAB — BASIC METABOLIC PANEL WITH GFR
Anion gap: 10 (ref 5–15)
BUN: 40 mg/dL — ABNORMAL HIGH (ref 8–23)
CO2: 25 mmol/L (ref 22–32)
Calcium: 8.8 mg/dL — ABNORMAL LOW (ref 8.9–10.3)
Chloride: 101 mmol/L (ref 98–111)
Creatinine, Ser: 10.88 mg/dL — ABNORMAL HIGH (ref 0.61–1.24)
GFR, Estimated: 5 mL/min — ABNORMAL LOW
Glucose, Bld: 99 mg/dL (ref 70–99)
Potassium: 4.6 mmol/L (ref 3.5–5.1)
Sodium: 136 mmol/L (ref 135–145)

## 2023-06-16 LAB — HEPATITIS B SURFACE ANTIGEN: Hepatitis B Surface Ag: NONREACTIVE

## 2023-06-16 MED ORDER — CHLORHEXIDINE GLUCONATE CLOTH 2 % EX PADS: 6.0000 | MEDICATED_PAD | Freq: Every day | CUTANEOUS | Status: DC

## 2023-06-16 NOTE — ED Provider Notes (Signed)
At change of shift care signed out from Dr. Estell Harpin pending nephrology's recommendations.  Their formal recommendation is that this patient can follow-up at interventional radiology or with vascular surgery to have the access evaluated however the access is working tonight, he finished hemodialysis in the ER, this can be arranged through his nursing home or through his dialysis center.  He is recommended that he be discharged back to his facility tonight.  The patient is in no distress, his vital signs are stable, the patient is well-appearing   Zachary Hong, MD 06/16/23 [phone]

## 2023-06-16 NOTE — ED Notes (Signed)
Attempted to call report to [facility]. Pt to be discharged after dialysis completed.

## 2023-06-16 NOTE — Discharge Instructions (Addendum)
  Dr. Arrie Aran with the kidney service has recommended that you can follow-up at either [facility] with interventional radiology or at the vascular surgery clinic, he has recommended that you be discharged back to your facility after finishing dialysis.  Please have the dialysis center coordinate this, you can go to your dialysis appointment at your next visit and let them know.  Emergency department for severe or worsening symptoms

## 2023-06-16 NOTE — ED Notes (Signed)
Pt receiving dialysis at this time.

## 2023-06-16 NOTE — Progress Notes (Signed)
   06/16/23 1815  Vitals  Temp 98 F (36.7 C)  Temp Source Oral  BP 110/70  MAP (mmHg) 82  BP Location Right Arm  BP Method Automatic  Pulse Rate 74  ECG Heart Rate 75  Resp 14  Oxygen Therapy  SpO2 100 %  O2 Device Room Air  During Treatment Monitoring  Intra-Hemodialysis Comments Tx completed  Post Treatment  Dialyzer Clearance Clotted  Hemodialysis Intake (mL) 0 mL  Liters Processed 40  Fluid Removed (mL) 1500 mL  Tolerated HD Treatment Yes  Post-Hemodialysis Comments Pt goal met. Pt AVF access needs to be recheck d/t BFR low 250- 300. Dr. Arrie Aran notified.  AVG/AVF Arterial Site Held (minutes) 7 minutes  AVG/AVF Venous Site Held (minutes) 7 minutes

## 2023-06-16 NOTE — ED Notes (Signed)
Dialysis RN at bedside.

## 2023-06-16 NOTE — ED Provider Notes (Signed)
[facility] EMERGENCY DEPARTMENT AT [facility] Provider Note   CSN: [id number] Arrival date & time: 06/16/23  8469     History {Add pertinent medical, surgical, social history, OB history to HPI:1} Chief Complaint  Patient presents with   Unable to access fistula    Dialysis unable to access fistula and no tx for the past 7 days    Zachary [name] is a 77 y.o. male.  Patient has a history of renal failure, he went to dialysis today and they were unable to use his fistula.   Weakness      Home Medications Prior to Admission medications   Medication Sig Start Date End Date Taking? Authorizing Provider  aspirin EC 81 MG tablet Take 81 mg by mouth daily. 0900    [provider]  atorvastatin (LIPITOR) 20 MG tablet Take 20 mg by mouth at bedtime. 2200    [provider]  Insulin Glargine (BASAGLAR KWIKPEN) 100 UNIT/ML Inject 8 Units into the skin at bedtime. 2200    [provider]  Insulin Pen Needle (BD AUTOSHIELD DUO) 30G X 5 MM MISC by Does not apply route. 3/16"    [provider]  levothyroxine (SYNTHROID) 125 MCG tablet Take 125 mcg by mouth at bedtime. 2200    [provider]  midodrine (PROAMATINE) 10 MG tablet Take 10 mg by mouth See admin instructions. Send med with resident on  Tues/Thurs/Sat to dialysis. [facility] not [facility], give to resident to take with him. send with 5mg  to equal 15mg . 6:00 06/01/19   [provider]  midodrine (PROAMATINE) 5 MG tablet Take 5 mg by mouth See admin instructions. Send with resident on dialysis days.  Tues/Thurs/Sat  Take along with 10 mg to equal 15 mg 6:00 06/01/19   [provider]  NON FORMULARY Diet: Regular diet    [provider]  Nutritional Supplements (,FEEDING SUPPLEMENT, PROSOURCE PLUS) liquid Take 30 mLs by mouth 2 (two) times daily between meals. 1600 and 2200    [provider]  omega-3 acid ethyl esters (LOVAZA) 1 g capsule Take 2 g  by mouth at bedtime. 2200 06/03/19   [provider]  omeprazole (PRILOSEC) 40 MG capsule Take 40 mg by mouth at bedtime. 2200 01/23/20   [provider]  polyethylene glycol (MIRALAX / GLYCOLAX) packet Take 17 g by mouth daily as needed for mild constipation.  05/27/20   [provider]  sevelamer carbonate (RENVELA) 800 MG tablet Take 3 tablets (2,400 mg total) by mouth 3 (three) times daily with meals. 10/03/22   Ghimire, Werner Lean, MD  tamsulosin (FLOMAX) 0.4 MG CAPS capsule Take 0.4 mg by mouth at bedtime. Give 30 minutes after a meal. Do not crush or chew    [provider]  torsemide (DEMADEX) 20 MG tablet Take 20 mg by mouth See admin instructions. 20mg  daily at 2000 on Sunday, Monday, Wednesday, and Friday only.    [provider]  vitamin B-12 (CYANOCOBALAMIN) 1000 MCG tablet Take 1,000 mcg by mouth at bedtime.    [provider]      Allergies    Patient has no known allergies.    Review of Systems   Review of Systems  Neurological:  Positive for weakness.    Physical Exam Updated Vital Signs BP (!) 146/77   Pulse 71   Temp 98 F (36.7 C)   Resp 14   Ht 5\' 8"  (1.727 m)   Wt  95 kg   SpO2 98%   BMI 31.84 kg/m  Physical Exam  ED Results / Procedures / Treatments   Labs (all labs ordered are listed, but only abnormal results are displayed) Labs Reviewed  BASIC METABOLIC PANEL - Abnormal; Notable for the following components:      Result Value   BUN 40 (*)    Creatinine, Ser 10.88 (*)    Calcium 8.8 (*)    GFR, Estimated 5 (*)    All other components within normal limits  HEPATITIS B SURFACE ANTIGEN  HEPATITIS B SURFACE ANTIBODY, QUANTITATIVE    EKG None  Radiology No results found.  Procedures Procedures  {Document cardiac monitor, telemetry assessment procedure when appropriate:1}  Medications Ordered in ED Medications  Chlorhexidine Gluconate Cloth 2 % PADS 6 each (has no administration in time range)     ED Course/ Medical Decision Making/ A&P   {Patient is getting dialysis today here in the ED.  There are some questions about whether his fistula is working correctly.  Nephrology is going to talk with vascular surgery about how to correct the fistula Click here for ABCD2, HEART and other calculatorsREFRESH Note before signing :1}                              Medical Decision Making Amount and/or Complexity of Data Reviewed Labs: ordered. ECG/medicine tests: ordered.   Renal failure with faulty dialysis fistula  {Document critical care time when appropriate:1} {Document review of labs and clinical decision tools ie heart score, Chads2Vasc2 etc:1}  {Document your independent review of radiology images, and any outside records:1} {Document your discussion with family members, caretakers, and with consultants:1} {Document social determinants of health affecting pt's care:1} {Document your decision making why or why not admission, treatments were needed:1} Final Clinical Impression(s) / ED Diagnoses Final diagnoses:  None    Rx / DC Orders ED Discharge Orders     None

## 2023-06-16 NOTE — ED Triage Notes (Signed)
COVID positive as of sometime this week per pt. Pt was at dialysis and they were unable to access left upper arm fistula. Pt has not had dialysis in 7 days and they have not been able to obtain labs.

## 2023-06-16 NOTE — Progress Notes (Signed)
Able to cannulate AVF and perform HD but at decreased BFR.  He will need outpatient imaging either at the vascular access center or at [facility] with IR or VVS.  He can be discharged back to his facility after dialysis.

## 2023-06-16 NOTE — Progress Notes (Addendum)
Asked to evaluate Zachary [name] who was sent from his HD unit due to inability to cannulate his LUE AVF.  He was seen by VVS on 05/06/23 and had a normal fistulogram at that time.  His AVF has a +Thrill and bruit throughout.  Will attempt cannulation here and if successful can go home after HD.  If unsuccessful, he will need to be transferred to [facility] for vascular evaluation.  He reports that he was covid positive at [facility] center on 06/13/23.  Covid test not rechecked.  Dialysis Orders:  TTS at [facility] 4:15hr, 400/500, EDW 90.5kg, LUE AVF, 2K/2.5Ca, heparin 1600units/hr - no ESA - calcitriol PO q HD

## 2023-06-17 ENCOUNTER — Encounter: Payer: Self-pay | Admitting: Adult Health

## 2023-06-17 ENCOUNTER — Non-Acute Institutional Stay: Payer: Self-pay | Admitting: Adult Health

## 2023-06-17 DIAGNOSIS — N186 End stage renal disease: Secondary | ICD-10-CM

## 2023-06-17 DIAGNOSIS — Z992 Dependence on renal dialysis: Secondary | ICD-10-CM

## 2023-06-17 DIAGNOSIS — R5381 Other malaise: Secondary | ICD-10-CM | POA: Diagnosis not present

## 2023-06-17 DIAGNOSIS — U071 COVID-19: Secondary | ICD-10-CM | POA: Diagnosis not present

## 2023-06-17 DIAGNOSIS — Z743 Need for continuous supervision: Secondary | ICD-10-CM | POA: Diagnosis not present

## 2023-06-17 DIAGNOSIS — E1122 Type 2 diabetes mellitus with diabetic chronic kidney disease: Secondary | ICD-10-CM | POA: Diagnosis not present

## 2023-06-17 DIAGNOSIS — R531 Weakness: Secondary | ICD-10-CM | POA: Diagnosis not present

## 2023-06-17 LAB — HEPATITIS B SURFACE ANTIBODY, QUANTITATIVE: Hep B S AB Quant (Post): 275 m[IU]/mL

## 2023-06-17 NOTE — Progress Notes (Signed)
Location:  [facility] Room Number: 112 Place of Service:  SNF (31)   CODE STATUS: full   No Known Allergies  Chief Complaint  Patient presents with   Acute Visit    Follow up ED visit     HPI:  He presented to the ED due to "clotted" A/V fistula. He completed his treatment in the ED. He will need outpatient follow up for further evaluation of graft. He denies any pain; no fevers; no weakness present.   Past Medical History:  Diagnosis Date   Abnormal CT scan, kidney 10/06/2011   Acute pyelonephritis 10/07/2011   Anemia    normocytic   Anxiety    mental retardation   Bladder wall thickening 10/06/2011   BPH (benign prostatic hypertrophy)    Diabetes mellitus    Dialysis patient (HCC)    Tuesday, Thursday and Saturday,    DVT of leg (deep venous thrombosis) (HCC) 12/25/2016   Edema     history of lower extremity edema   GERD (gastroesophageal reflux disease)    Heme positive stool    Hydronephrosis    Hyperkalemia    Hyperlipidemia    Hypernatremia    Hypertension    Hypothyroidism    Impaired speech    Infected prosthetic vascular graft (HCC)    MR (mental retardation)    Muscle weakness    Obstructive uropathy    Perinephric abscess 10/07/2011   Poor historian poor historian   Primary colorectal adenocarcinoma (HCC) 04/2017   S/p total colectomy   Protein calorie malnutrition (HCC)    Pyelonephritis    Renal failure (ARF), acute on chronic (HCC)    Renal insufficiency    chronic history   Sepsis (HCC)    Smoking    Uremia    Urinary retention    UTI (lower urinary tract infection) 10/06/2011    Past Surgical History:  Procedure Laterality Date   A/V FISTULAGRAM N/A 08/13/2018   Procedure: A/V FISTULAGRAM - Right Upper;  Surgeon: Sherren Kerns, MD;  Location: [facility] INVASIVE CV LAB;  Service: Cardiovascular;  Laterality: N/A;   A/V FISTULAGRAM N/A 11/22/2018   Procedure: A/V FISTULAGRAM - Right Upper;  Surgeon: Maeola Harman, MD;  Location: [facility] INVASIVE CV LAB;  Service: Cardiovascular;  Laterality: N/A;   A/V FISTULAGRAM Left 10/08/2022   Procedure: A/V Fistulagram;  Surgeon: Victorino Sparrow, MD;  Location: [facility] INVASIVE CV LAB;  Service: Cardiovascular;  Laterality: Left;   A/V FISTULAGRAM Left 05/06/2023   Procedure: A/V Fistulagram;  Surgeon: Leonie Douglas, MD;  Location: [facility] INVASIVE CV LAB;  Service: Cardiovascular;  Laterality: Left;   AV FISTULA PLACEMENT Left 07/06/2015   Procedure:  INSERTION LEFT ARM ARTERIOVENOUS GORTEX GRAFT;  Surgeon: Chuck Hint, MD;  Location: [facility] OR;  Service: Vascular;  Laterality: Left;   AV FISTULA PLACEMENT Right 02/26/2016   Procedure: ARTERIOVENOUS (AV) FISTULA CREATION ;  Surgeon: Chuck Hint, MD;  Location: [facility] OR;  Service: Vascular;  Laterality: Right;   AV FISTULA PLACEMENT Right 11/25/2018   Procedure: INSERTION OF ARTERIOVENOUS (AV) ARTEGRAFT RIGHT UPPER ARM;  Surgeon: Maeola Harman, MD;  Location: [facility] OR;  Service: Vascular;  Laterality: Right;   AV FISTULA PLACEMENT Left 05/28/2021   Procedure: LEFT ARM ARTERIOVENOUS (AV) FISTULA;  Surgeon: Larina Earthly, MD;  Location: [facility] ORS;  Service: Vascular;  Laterality: Left;   AVGG REMOVAL Left 10/09/2015   Procedure: REMOVAL OF ARTERIOVENOUS GORETEX GRAFT (AVGG) Evacuation of  Lymphocele, Vein Patch angioplasty of brachial artery.;  Surgeon: Chuck Hint, MD;  Location: [facility] OR;  Service: Vascular;  Laterality: Left;   BASCILIC VEIN TRANSPOSITION Right 02/26/2016   Procedure: Right BASCILIC VEIN TRANSPOSITION;  Surgeon: Chuck Hint, MD;  Location: [facility] OR;  Service: Vascular;  Laterality: Right;   BASCILIC VEIN TRANSPOSITION Left 07/30/2021   Procedure: LEFT ARM SECOND STAGE BASILIC VEIN TRANSPOSITION;  Surgeon: Larina Earthly, MD;  Location: [facility] ORS;  Service: Vascular;  Laterality: Left;   CIRCUMCISION N/A 01/04/2014   Procedure: CIRCUMCISION ADULT (procedure #1);   Surgeon: Ky Barban, MD;  Location: [facility] ORS;  Service: Urology;  Laterality: N/A;   COLECTOMY N/A 05/04/2017   Procedure: TOTAL COLECTOMY;  Surgeon: Franky Macho, MD;  Location: [facility] ORS;  Service: General;  Laterality: N/A;   COLONOSCOPY N/A 04/27/2017   Surgeon: Corbin Ade, MD; annular mass in the ascending colon likely representing cancer biopsied, multiple 6-22 mm polyps removed, clean rectum.  Pathology with multiple tubular adenomas, high-grade dysplasia noted in ascending colon and splenic flexure.   CYSTOSCOPY W/ RETROGRADES Bilateral 06/29/2015   Procedure: CYSTOSCOPY, DILATION OF URETHRAL STRICTURE WITH BILATERAL RETROGRADE PYELOGRAM,SUPRAPUBIC TUBE CHANGE;  Surgeon: Jerilee Field, MD;  Location: [facility] ORS;  Service: Urology;  Laterality: Bilateral;   CYSTOSCOPY WITH URETHRAL DILATATION N/A 12/29/2013   Procedure: CYSTOSCOPY WITH URETHRAL DILATATION;  Surgeon: Ky Barban, MD;  Location: [facility] ORS;  Service: Urology;  Laterality: N/A;   ESOPHAGOGASTRODUODENOSCOPY N/A 04/27/2017   Procedure: ESOPHAGOGASTRODUODENOSCOPY (EGD);  Surgeon: Corbin Ade, MD;  Location: [facility];  Service: Endoscopy;  Laterality: N/A;   FLEXIBLE SIGMOIDOSCOPY N/A 12/02/2021   Procedure: FLEXIBLE SIGMOIDOSCOPY;  Surgeon: Lanelle Bal, DO;  Location: [facility];  Service: Endoscopy;  Laterality: N/A;  10:30am, dialysis patient   INSERTION OF DIALYSIS CATHETER Right 11/25/2018   Procedure: INSERTION OF DIALYSIS CATHETER RIGHT INTERNAL JUGULAR;  Surgeon: Maeola Harman, MD;  Location: [facility] OR;  Service: Vascular;  Laterality: Right;   IR AV DIALY SHUNT INTRO NEEDLE/INTRACATH INITIAL W/PTA/IMG RIGHT Right 09/07/2018   IR FLUORO GUIDE CV LINE RIGHT  10/16/2020   IR FLUORO GUIDE CV LINE RIGHT  10/03/2022   IR REMOVAL TUN CV CATH W/O FL  01/12/2019   IR THROMBECTOMY AV FISTULA W/THROMBOLYSIS/PTA INC/SHUNT/IMG RIGHT Right 04/26/2018   IR US GUIDE VASC ACCESS RIGHT  04/26/2018   IR US  GUIDE VASC ACCESS RIGHT  09/07/2018   IR US GUIDE VASC ACCESS RIGHT  10/16/2020   IR US GUIDE VASC ACCESS RIGHT  10/16/2020   IR US GUIDE VASC ACCESS RIGHT  10/03/2022   ORIF FEMUR FRACTURE Right 11/22/2016   Procedure: OPEN REDUCTION INTERNAL FIXATION (ORIF) DISTAL FEMUR FRACTURE;  Surgeon: Samson Frederic, MD;  Location: [facility] OR;  Service: Orthopedics;  Laterality: Right;   PATCH ANGIOPLASTY Right 12/10/2017   Procedure: PATCH ANGIOPLASTY;  Surgeon: Chuck Hint, MD;  Location: [facility] OR;  Service: Vascular;  Laterality: Right;   PERIPHERAL VASCULAR BALLOON ANGIOPLASTY  08/13/2018   Procedure: PERIPHERAL VASCULAR BALLOON ANGIOPLASTY;  Surgeon: Sherren Kerns, MD;  Location: [facility] INVASIVE CV LAB;  Service: Cardiovascular;;  right AV fistula    PERIPHERAL VASCULAR BALLOON ANGIOPLASTY  11/22/2018   Procedure: PERIPHERAL VASCULAR BALLOON ANGIOPLASTY;  Surgeon: Maeola Harman, MD;  Location: [facility] INVASIVE CV LAB;  Service: Cardiovascular;;  rt AV fistula   PERIPHERAL VASCULAR BALLOON ANGIOPLASTY  10/08/2022   Procedure: PERIPHERAL VASCULAR BALLOON ANGIOPLASTY;  Surgeon: Victorino Sparrow, MD;  Location: [facility] INVASIVE CV LAB;  Service: Cardiovascular;;  arterial anastamosis   PERIPHERAL VASCULAR CATHETERIZATION N/A 10/08/2015   Procedure: A/V Shuntogram;  Surgeon: Chuck Hint, MD;  Location: [facility] INVASIVE CV LAB;  Service: Cardiovascular;  Laterality: N/A;   PERIPHERAL VASCULAR INTERVENTION Left 10/08/2022   Procedure: PERIPHERAL VASCULAR INTERVENTION;  Surgeon: Victorino Sparrow, MD;  Location: [facility] INVASIVE CV LAB;  Service: Cardiovascular;  Laterality: Left;  left AVF   REMOVAL OF A DIALYSIS CATHETER N/A 12/03/2021   Procedure: MINOR REMOVAL OF A TUNNELED DIALYSIS CATHETER;  Surgeon: Larina Earthly, MD;  Location: [facility]S;  Service: Vascular;  Laterality: N/A;   REMOVAL OF A DIALYSIS CATHETER N/A 11/18/2022   Procedure: MINOR REMOVAL OF A TUNNELED DIALYSIS CATHETER;  Surgeon: Larina Earthly, MD;  Location: [facility];  Service: Vascular;  Laterality: N/A;   THROMBECTOMY W/ EMBOLECTOMY Right 12/10/2017   Procedure: THROMBECTOMY REVISION RIGHT ARM  ARTERIOVENOUS FISTULA;  Surgeon: Chuck Hint, MD;  Location: [facility] OR;  Service: Vascular;  Laterality: Right;   TRANSURETHRAL RESECTION OF PROSTATE N/A 01/04/2014   Procedure: TRANSURETHRAL RESECTION OF THE PROSTATE (TURP) (procedure #2);  Surgeon: Ky Barban, MD;  Location: [facility];  Service: Urology;  Laterality: N/A;    Social History   Socioeconomic History   Marital status: Single    Spouse name: Not on file   Number of children: Not on file   Years of education: Not on file   Highest education level: Not on file  Occupational History   Occupation: retired   Tobacco Use   Smoking status: Never   Smokeless tobacco: Never  Vaping Use   Vaping status: Never Used  Substance and Sexual Activity   Alcohol use: No   Drug use: No   Sexual activity: Not Currently  Other Topics Concern   Not on file  Social History Narrative   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   Long term resident of [facility]    Social Determinants of Health   Financial Resource Strain: Low Risk  (02/18/2018)   Overall Financial Resource Strain (CARDIA)    Difficulty of Paying Living Expenses: Not hard at all  Food Insecurity: No Food Insecurity (02/18/2018)   Hunger Vital Sign    Worried About Running Out of Food in the Last Year: Never true    Ran Out of Food in the Last Year: Never true  Transportation Needs: No Transportation Needs (02/18/2018)   PRAPARE - Administrator, Civil Service (Medical): No    Lack of Transportation (Non-Medical): No  Physical Activity: Unknown (09/15/2019)   Exercise Vital Sign    Days of Exercise per Week: Not on file    Minutes of Exercise per Session: 0 min  Stress: No Stress Concern Present (02/18/2018)   [employer] of Occupational Health -  Occupational Stress Questionnaire    Feeling of Stress : Not at all  Social Connections: Unknown (09/15/2019)   Social Connection and Isolation Panel [NHANES]    Frequency of Communication with Friends and Family: Not on file    Frequency of Social Gatherings with Friends and Family: Never    Attends Religious Services: Not on file    Active Member of Clubs or Organizations: Not on file    Attends Banker Meetings: Not on file    Marital Status: Not on file  Intimate Partner Violence: Not At Risk (02/18/2018)   Humiliation, Afraid, Rape, and Kick questionnaire  Fear of Current or Ex-Partner: No    Emotionally Abused: No    Physically Abused: No    Sexually Abused: No   Family History  Problem Relation Age of Onset   Cancer Mother    Colon cancer Neg Hx       VITAL SIGNS BP 136/74   Pulse 77   Temp 97.7 F (36.5 C)   Resp 19   Ht 5\' 8"  (1.727 m)   Wt 201 lb 12.8 oz (91.5 kg)   SpO2 97%   BMI 30.68 kg/m   Outpatient Encounter Medications as of 06/17/2023  Medication Sig   aspirin EC 81 MG tablet Take 81 mg by mouth daily. 0900   atorvastatin (LIPITOR) 20 MG tablet Take 20 mg by mouth at bedtime. 2200   Insulin Glargine (BASAGLAR KWIKPEN) 100 UNIT/ML Inject 8 Units into the skin at bedtime. 2200   Insulin Pen Needle (BD AUTOSHIELD DUO) 30G X 5 MM MISC by Does not apply route. 3/16"   levothyroxine (SYNTHROID) 125 MCG tablet Take 125 mcg by mouth at bedtime. 2200   midodrine (PROAMATINE) 10 MG tablet Take 10 mg by mouth See admin instructions. Send med with resident on  Tues/Thurs/Sat to dialysis. do not give at [facility], give to resident to take with him. send with 5mg  to equal 15mg . 6:00   midodrine (PROAMATINE) 5 MG tablet Take 5 mg by mouth See admin instructions. Send with resident on dialysis days.  Tues/Thurs/Sat  Take along with 10 mg to equal 15 mg 6:00   NON FORMULARY Diet: Regular diet   Nutritional Supplements (,FEEDING SUPPLEMENT, PROSOURCE  PLUS) liquid Take 30 mLs by mouth 2 (two) times daily between meals. 1600 and 2200   omega-3 acid ethyl esters (LOVAZA) 1 g capsule Take 2 g by mouth at bedtime. 2200   omeprazole (PRILOSEC) 40 MG capsule Take 40 mg by mouth at bedtime. 2200   polyethylene glycol (MIRALAX / GLYCOLAX) packet Take 17 g by mouth daily as needed for mild constipation.    sevelamer carbonate (RENVELA) 800 MG tablet Take 3 tablets (2,400 mg total) by mouth 3 (three) times daily with meals.   tamsulosin (FLOMAX) 0.4 MG CAPS capsule Take 0.4 mg by mouth at bedtime. Give 30 minutes after a meal. Do not crush or chew   torsemide (DEMADEX) 20 MG tablet Take 20 mg by mouth See admin instructions. 20mg  daily at 2000 on Sunday, Monday, Wednesday, and Friday only.   vitamin B-12 (CYANOCOBALAMIN) 1000 MCG tablet Take 1,000 mcg by mouth at bedtime.   No facility-administered encounter medications on file as of 06/17/2023.     SIGNIFICANT DIAGNOSTIC EXAMS  PREVIOUS   06-11-22: dexa: t score: -2.052  NO NEW EXAMS.    LABS REVIEWED PREVIOUS;      06-19-22: hgb a1c 5.8; tsh 2.539 free t4: 0.98 09-29-22: wbc 4.3; hgb 12.9; hct 41.1; mcv 108.4 plt 108; glucose 112; bun 44; creat 16.87; k+ 4.6; na++ 139; ca 9.3 gfr 3; protein 7.0; albumin 3.5; hgb A1c 5.0; tsh 5.861 chol 89; ldl 41; trig 68; hdl 35 10-08-22: glucose 101; bun 22; creat 12.30; k+ 5.2; na++ 141; ion ca 1.14;  02-13-23: wbc 3.8; hgb 9.7; hct 28.7; mcv 101.1 plt 126; glucose 67; bun 18; creat 6.28; k+ 2.9; na++ 137; ca 8.8; gfr 9; protein 6.9; albumin 3.2 mag 1.8; occult blood: neg 02-23-23: hgb 9.8; hct 29.7 glucose 110; bun 24; creat 8.32 k+ 3.6; na++ 139; ca 9.6 gfr 6  TODAY  06-11-23: wbc 4.4; hgb 11.7; hct 35.2; mcv 102.3 plt 88; glucose 160; bun 20; creat 4.71; k+ 3.5; na++ 137; ca 8.4; gfr 12   06-13-23: wbc 3.6; hgb 13.0; hct 39.2; mcv 102.6 plt 110; glucose 103; bun 11; creat 4.13; k+ 3.3; na++ 138; ca 8.6; gfr 14; d-dimer 0.54; CRP <0.5  06-16-23: glucose 99;  bun 40; creat 10.88; k+ 4.6; na++ 136; ca 8.8; gfr 5   Review of Systems  Constitutional:  Negative for malaise/fatigue.  Respiratory:  Negative for cough and shortness of breath.   Cardiovascular:  Negative for chest pain, palpitations and leg swelling.  Gastrointestinal:  Negative for abdominal pain, constipation and heartburn.  Musculoskeletal:  Negative for back pain, joint pain and myalgias.  Skin: Negative.   Neurological:  Negative for dizziness.  Psychiatric/Behavioral:  The patient is not nervous/anxious.     Physical Exam Constitutional:      General: He is not in acute distress.    Appearance: He is well-developed. He is not diaphoretic.  Neck:     Thyroid: No thyromegaly.  Cardiovascular:     Rate and Rhythm: Normal rate and regular rhythm.     Pulses: Normal pulses.     Heart sounds: Normal heart sounds.  Pulmonary:     Effort: Pulmonary effort is normal. No respiratory distress.     Breath sounds: Normal breath sounds.  Abdominal:     General: Bowel sounds are normal. There is no distension.     Palpations: Abdomen is soft.     Tenderness: There is no abdominal tenderness.  Musculoskeletal:        General: Normal range of motion.     Cervical back: Neck supple.     Right lower leg: No edema.     Left lower leg: No edema.  Lymphadenopathy:     Cervical: No cervical adenopathy.  Skin:    General: Skin is warm and dry.     Comments:  Left arm fistula +thrill+bruit      Neurological:     Mental Status: He is alert. Mental status is at baseline.     Comments: 02-27-23: SLUMS 2/30  Psychiatric:        Mood and Affect: Mood normal.       ASSESSMENT/ PLAN:  TODAY  End stage renal disease on dialysis due to type 2 diabetes mellitus: will continue dialysis three days weekly; will monitor his status.    Synthia Innocent NP [facility]  call [phone]

## 2023-06-18 ENCOUNTER — Telehealth: Payer: Self-pay

## 2023-06-18 DIAGNOSIS — D509 Iron deficiency anemia, unspecified: Secondary | ICD-10-CM | POA: Diagnosis not present

## 2023-06-18 DIAGNOSIS — Z992 Dependence on renal dialysis: Secondary | ICD-10-CM | POA: Diagnosis not present

## 2023-06-18 DIAGNOSIS — N2581 Secondary hyperparathyroidism of renal origin: Secondary | ICD-10-CM | POA: Diagnosis not present

## 2023-06-18 DIAGNOSIS — N250 Renal osteodystrophy: Secondary | ICD-10-CM | POA: Diagnosis not present

## 2023-06-18 DIAGNOSIS — D631 Anemia in chronic kidney disease: Secondary | ICD-10-CM | POA: Diagnosis not present

## 2023-06-18 DIAGNOSIS — N186 End stage renal disease: Secondary | ICD-10-CM | POA: Diagnosis not present

## 2023-06-18 NOTE — Transitions of Care (Post Inpatient/ED Visit) (Signed)
   06/18/2023  Name: Zachary [name] MRN: [id number] DOB: [birth year]  Today's TOC FU Call Status: Today's TOC FU Call Status:: Unsuccessful Call (1st Attempt) Unsuccessful Call (1st Attempt) Date: 06/18/23  Attempted to reach the patient regarding the most recent Inpatient/ED visit.  Follow Up Plan: No further outreach attempts will be made at this time. We have been unable to contact the patient.  Patient is also a SNF patient   Signature Isidoro Donning, CMA

## 2023-06-19 ENCOUNTER — Non-Acute Institutional Stay (SKILLED_NURSING_FACILITY): Payer: Medicare Other | Admitting: Adult Health

## 2023-06-19 ENCOUNTER — Encounter: Payer: Self-pay | Admitting: Adult Health

## 2023-06-19 DIAGNOSIS — Z992 Dependence on renal dialysis: Secondary | ICD-10-CM | POA: Diagnosis not present

## 2023-06-19 DIAGNOSIS — I700 Atherosclerosis of aorta: Secondary | ICD-10-CM | POA: Diagnosis not present

## 2023-06-19 DIAGNOSIS — D696 Thrombocytopenia, unspecified: Secondary | ICD-10-CM

## 2023-06-19 DIAGNOSIS — E1122 Type 2 diabetes mellitus with diabetic chronic kidney disease: Secondary | ICD-10-CM

## 2023-06-19 DIAGNOSIS — N186 End stage renal disease: Secondary | ICD-10-CM

## 2023-06-19 NOTE — Progress Notes (Signed)
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 He has been treated for covid. He did to the ED due to his fistula being clotted during dialysis.   Past Medical History:  Diagnosis Date   Abnormal CT scan, kidney 10/06/2011   Acute pyelonephritis 10/07/2011   Anemia    normocytic   Anxiety    mental retardation   Bladder wall thickening 10/06/2011   BPH (benign prostatic hypertrophy)    Diabetes mellitus    Dialysis patient (HCC)    Tuesday, Thursday and Saturday,    DVT of leg (deep venous thrombosis) (HCC) 12/25/2016   Edema     history of lower extremity edema   GERD (gastroesophageal reflux disease)    Heme positive stool    Hydronephrosis    Hyperkalemia    Hyperlipidemia    Hypernatremia    Hypertension    Hypothyroidism    Impaired speech    Infected prosthetic vascular graft (HCC)    MR (mental retardation)    Muscle weakness    Obstructive uropathy    Perinephric abscess 10/07/2011   Poor historian poor historian   Primary colorectal adenocarcinoma (HCC) 04/2017   S/p total colectomy   Protein calorie malnutrition (HCC)    Pyelonephritis    Renal failure (ARF), acute on chronic (HCC)    Renal insufficiency    chronic history   Sepsis (HCC)    Smoking    Uremia    Urinary retention    UTI (lower urinary tract infection) 10/06/2011    Past Surgical History:   Procedure Laterality Date   A/V FISTULAGRAM N/A 08/13/2018   Procedure: A/V FISTULAGRAM - Right Upper;  Surgeon: Sherren Kerns, MD;  Location: [facility] INVASIVE CV LAB;  Service: Cardiovascular;  Laterality: N/A;   A/V FISTULAGRAM N/A 11/22/2018   Procedure: A/V FISTULAGRAM - Right Upper;  Surgeon: Maeola Harman, MD;  Location: [facility] INVASIVE CV LAB;  Service: Cardiovascular;  Laterality: N/A;   A/V FISTULAGRAM Left 10/08/2022   Procedure: A/V Fistulagram;  Surgeon: Victorino Sparrow, MD;  Location: [facility] INVASIVE CV LAB;  Service: Cardiovascular;  Laterality: Left;   A/V FISTULAGRAM Left 05/06/2023   Procedure: A/V Fistulagram;  Surgeon: Leonie Douglas, MD;  Location: [facility] INVASIVE CV LAB;  Service: Cardiovascular;  Laterality: Left;   AV FISTULA PLACEMENT Left 07/06/2015   Procedure:  INSERTION LEFT ARM ARTERIOVENOUS GORTEX GRAFT;  Surgeon: Chuck Hint, MD;  Location: [facility] OR;  Service: Vascular;  Laterality: Left;   AV FISTULA PLACEMENT Right 02/26/2016   Procedure: ARTERIOVENOUS (AV) FISTULA CREATION ;  Surgeon: Chuck Hint, MD;  Location: [facility] OR;  Service: Vascular;  Laterality: Right;   AV FISTULA PLACEMENT Right 11/25/2018   Procedure: INSERTION OF ARTERIOVENOUS (AV) ARTEGRAFT RIGHT UPPER ARM;  Surgeon: Randie Heinz,  Dennard Schaumann, MD;  Location: [facility] OR;  Service: Vascular;  Laterality: Right;   AV FISTULA PLACEMENT Left 05/28/2021   Procedure: LEFT ARM ARTERIOVENOUS (AV) FISTULA;  Surgeon: Larina Earthly, MD;  Location: [facility] ORS;  Service: Vascular;  Laterality: Left;   AVGG REMOVAL Left 10/09/2015   Procedure: REMOVAL OF ARTERIOVENOUS GORETEX GRAFT (AVGG) Evacuation of Lymphocele, Vein Patch angioplasty of brachial artery.;  Surgeon: Chuck Hint, MD;  Location: [facility] OR;  Service: Vascular;  Laterality: Left;   BASCILIC VEIN TRANSPOSITION Right 02/26/2016   Procedure: Right BASCILIC VEIN TRANSPOSITION;  Surgeon: Chuck Hint, MD;  Location: [facility] OR;  Service:  Vascular;  Laterality: Right;   BASCILIC VEIN TRANSPOSITION Left 07/30/2021   Procedure: LEFT ARM SECOND STAGE BASILIC VEIN TRANSPOSITION;  Surgeon: Larina Earthly, MD;  Location: [facility] ORS;  Service: Vascular;  Laterality: Left;   CIRCUMCISION N/A 01/04/2014   Procedure: CIRCUMCISION ADULT (procedure #1);  Surgeon: Ky Barban, MD;  Location: [facility] ORS;  Service: Urology;  Laterality: N/A;   COLECTOMY N/A 05/04/2017   Procedure: TOTAL COLECTOMY;  Surgeon: Franky Macho, MD;  Location: [facility] ORS;  Service: General;  Laterality: N/A;   COLONOSCOPY N/A 04/27/2017   Surgeon: Corbin Ade, MD; annular mass in the ascending colon likely representing cancer biopsied, multiple 6-22 mm polyps removed, clean rectum.  Pathology with multiple tubular adenomas, high-grade dysplasia noted in ascending colon and splenic flexure.   CYSTOSCOPY W/ RETROGRADES Bilateral 06/29/2015   Procedure: CYSTOSCOPY, DILATION OF URETHRAL STRICTURE WITH BILATERAL RETROGRADE PYELOGRAM,SUPRAPUBIC TUBE CHANGE;  Surgeon: Jerilee Field, MD;  Location: [facility] ORS;  Service: Urology;  Laterality: Bilateral;   CYSTOSCOPY WITH URETHRAL DILATATION N/A 12/29/2013   Procedure: CYSTOSCOPY WITH URETHRAL DILATATION;  Surgeon: Ky Barban, MD;  Location: [facility] ORS;  Service: Urology;  Laterality: N/A;   ESOPHAGOGASTRODUODENOSCOPY N/A 04/27/2017   Procedure: ESOPHAGOGASTRODUODENOSCOPY (EGD);  Surgeon: Corbin Ade, MD;  Location: [facility];  Service: Endoscopy;  Laterality: N/A;   FLEXIBLE SIGMOIDOSCOPY N/A 12/02/2021   Procedure: FLEXIBLE SIGMOIDOSCOPY;  Surgeon: Lanelle Bal, DO;  Location: [facility];  Service: Endoscopy;  Laterality: N/A;  10:30am, dialysis patient   INSERTION OF DIALYSIS CATHETER Right 11/25/2018   Procedure: INSERTION OF DIALYSIS CATHETER RIGHT INTERNAL JUGULAR;  Surgeon: Maeola Harman, MD;  Location: [facility] OR;  Service: Vascular;  Laterality: Right;   IR AV DIALY SHUNT INTRO NEEDLE/INTRACATH  INITIAL W/PTA/IMG RIGHT Right 09/07/2018   IR FLUORO GUIDE CV LINE RIGHT  10/16/2020   IR FLUORO GUIDE CV LINE RIGHT  10/03/2022   IR REMOVAL TUN CV CATH W/O FL  01/12/2019   IR THROMBECTOMY AV FISTULA W/THROMBOLYSIS/PTA INC/SHUNT/IMG RIGHT Right 04/26/2018   IR US GUIDE VASC ACCESS RIGHT  04/26/2018   IR US GUIDE VASC ACCESS RIGHT  09/07/2018   IR US GUIDE VASC ACCESS RIGHT  10/16/2020   IR US GUIDE VASC ACCESS RIGHT  10/16/2020   IR US GUIDE VASC ACCESS RIGHT  10/03/2022   ORIF FEMUR FRACTURE Right 11/22/2016   Procedure: OPEN REDUCTION INTERNAL FIXATION (ORIF) DISTAL FEMUR FRACTURE;  Surgeon: Samson Frederic, MD;  Location: [facility] OR;  Service: Orthopedics;  Laterality: Right;   PATCH ANGIOPLASTY Right 12/10/2017   Procedure: PATCH ANGIOPLASTY;  Surgeon: Chuck Hint, MD;  Location: [facility] OR;  Service: Vascular;  Laterality: Right;   PERIPHERAL VASCULAR BALLOON ANGIOPLASTY  08/13/2018   Procedure: PERIPHERAL VASCULAR BALLOON ANGIOPLASTY;  Surgeon: Sherren Kerns, MD;  Location: [facility] INVASIVE CV LAB;  Service:  Cardiovascular;;  right AV fistula    PERIPHERAL VASCULAR BALLOON ANGIOPLASTY  11/22/2018   Procedure: PERIPHERAL VASCULAR BALLOON ANGIOPLASTY;  Surgeon: Maeola Harman, MD;  Location: [facility] INVASIVE CV LAB;  Service: Cardiovascular;;  rt AV fistula   PERIPHERAL VASCULAR BALLOON ANGIOPLASTY  10/08/2022   Procedure: PERIPHERAL VASCULAR BALLOON ANGIOPLASTY;  Surgeon: Victorino Sparrow, MD;  Location: [facility] INVASIVE CV LAB;  Service: Cardiovascular;;  arterial anastamosis   PERIPHERAL VASCULAR CATHETERIZATION N/A 10/08/2015   Procedure: A/V Shuntogram;  Surgeon: Chuck Hint, MD;  Location: [facility] INVASIVE CV LAB;  Service: Cardiovascular;  Laterality: N/A;   PERIPHERAL VASCULAR INTERVENTION Left 10/08/2022   Procedure: PERIPHERAL VASCULAR INTERVENTION;  Surgeon: Victorino Sparrow, MD;  Location: [facility] INVASIVE CV LAB;  Service: Cardiovascular;  Laterality: Left;  left AVF    REMOVAL OF A DIALYSIS CATHETER N/A 12/03/2021   Procedure: MINOR REMOVAL OF A TUNNELED DIALYSIS CATHETER;  Surgeon: Larina Earthly, MD;  Location: [facility]S;  Service: Vascular;  Laterality: N/A;   REMOVAL OF A DIALYSIS CATHETER N/A 11/18/2022   Procedure: MINOR REMOVAL OF A TUNNELED DIALYSIS CATHETER;  Surgeon: Larina Earthly, MD;  Location: [facility] ORS;  Service: Vascular;  Laterality: N/A;   THROMBECTOMY W/ EMBOLECTOMY Right 12/10/2017   Procedure: THROMBECTOMY REVISION RIGHT ARM  ARTERIOVENOUS FISTULA;  Surgeon: Chuck Hint, MD;  Location: [facility] OR;  Service: Vascular;  Laterality: Right;   TRANSURETHRAL RESECTION OF PROSTATE N/A 01/04/2014   Procedure: TRANSURETHRAL RESECTION OF THE PROSTATE (TURP) (procedure #2);  Surgeon: Ky Barban, MD;  Location: [facility]S;  Service: Urology;  Laterality: N/A;    Social History   Socioeconomic History   Marital status: Single    Spouse name: Not on file   Number of children: Not on file   Years of education: Not on file   Highest education level: Not on file  Occupational History   Occupation: retired   Tobacco Use   Smoking status: Never   Smokeless tobacco: Never  Vaping Use   Vaping status: Never Used  Substance and Sexual Activity   Alcohol use: No   Drug use: No   Sexual activity: Not Currently  Other Topics Concern   Not on file  Social History Narrative   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   Long term resident of [facility]    Social Determinants of Health   Financial Resource Strain: Low Risk  (02/18/2018)   Overall Financial Resource Strain (CARDIA)    Difficulty of Paying Living Expenses: Not hard at all  Food Insecurity: No Food Insecurity (02/18/2018)   Hunger Vital Sign    Worried About Running Out of Food in the Last Year: Never true    Ran Out of Food in the Last Year: Never true  Transportation Needs: No Transportation Needs (02/18/2018)   PRAPARE - Scientist, research (physical sciences) (Medical): No    Lack of Transportation (Non-Medical): No  Physical Activity: Unknown (09/15/2019)   Exercise Vital Sign    Days of Exercise per Week: Not on file    Minutes of Exercise per Session: 0 min  Stress: No Stress Concern Present (02/18/2018)   [employer] of Occupational Health - Occupational Stress Questionnaire    Feeling of Stress : Not at all  Social Connections: Unknown (09/15/2019)   Social Connection and Isolation Panel [NHANES]    Frequency of Communication with Friends and Family: Not on file    Frequency of Social Gatherings with  Friends and Family: Never    Attends Religious Services: Not on file    Active Member of Clubs or Organizations: Not on file    Attends Banker Meetings: Not on file    Marital Status: Not on file  Intimate Partner Violence: Not At Risk (02/18/2018)   Humiliation, Afraid, Rape, and Kick questionnaire    Fear of Current or Ex-Partner: No    Emotionally Abused: No    Physically Abused: No    Sexually Abused: No   Family History  Problem Relation Age of Onset   Cancer Mother    Colon cancer Neg Hx       VITAL SIGNS BP 130/72   Pulse 75   Temp 98.6 F (37 C)   Resp 20   Ht 5\' 8"  (1.727 m)   Wt 201 lb 12.8 oz (91.5 kg)   SpO2 98%   BMI 30.68 kg/m   Outpatient Encounter Medications as of 06/19/2023  Medication Sig   aspirin EC 81 MG tablet Take 81 mg by mouth daily. 0900   atorvastatin (LIPITOR) 20 MG tablet Take 20 mg by mouth at bedtime. 2200   Insulin Glargine (BASAGLAR KWIKPEN) 100 UNIT/ML Inject 8 Units into the skin at bedtime. 2200   Insulin Pen Needle (BD AUTOSHIELD DUO) 30G X 5 MM MISC by Does not apply route. 3/16"   levothyroxine (SYNTHROID) 125 MCG tablet Take 125 mcg by mouth at bedtime. 2200   midodrine (PROAMATINE) 10 MG tablet Take 10 mg by mouth See admin instructions. Send med with resident on  Tues/Thurs/Sat to dialysis. do not give at [facility], give to resident to take  with him. send with 5mg  to equal 15mg . 6:00   midodrine (PROAMATINE) 5 MG tablet Take 5 mg by mouth See admin instructions. Send with resident on dialysis days.  Tues/Thurs/Sat  Take along with 10 mg to equal 15 mg 6:00   NON FORMULARY Diet: Regular diet   Nutritional Supplements (,FEEDING SUPPLEMENT, PROSOURCE PLUS) liquid Take 30 mLs by mouth 2 (two) times daily between meals. 1600 and 2200   omega-3 acid ethyl esters (LOVAZA) 1 g capsule Take 2 g by mouth at bedtime. 2200   omeprazole (PRILOSEC) 40 MG capsule Take 40 mg by mouth at bedtime. 2200   polyethylene glycol (MIRALAX / GLYCOLAX) packet Take 17 g by mouth daily as needed for mild constipation.    sevelamer carbonate (RENVELA) 800 MG tablet Take 3 tablets (2,400 mg total) by mouth 3 (three) times daily with meals.   tamsulosin (FLOMAX) 0.4 MG CAPS capsule Take 0.4 mg by mouth at bedtime. Give 30 minutes after a meal. Do not crush or chew   torsemide (DEMADEX) 20 MG tablet Take 20 mg by mouth See admin instructions. 20mg  daily at 2000 on Sunday, Monday, Wednesday, and Friday only.   vitamin B-12 (CYANOCOBALAMIN) 1000 MCG tablet Take 1,000 mcg by mouth at bedtime.   No facility-administered encounter medications on file as of 06/19/2023.     SIGNIFICANT DIAGNOSTIC EXAMS  PREVIOUS   06-11-22: dexa: t score: -2.052  NO NEW EXAMS.    LABS REVIEWED PREVIOUS;      06-19-22: hgb a1c 5.8; tsh 2.539 free t4: 0.98 09-29-22: wbc 4.3; hgb 12.9; hct 41.1; mcv 108.4 plt 108; glucose 112; bun 44; creat 16.87; k+ 4.6; na++ 139; ca 9.3 gfr 3; protein 7.0; albumin 3.5; hgb A1c 5.0; tsh 5.861 chol 89; ldl 41; trig 68; hdl 35 10-08-22: glucose 101; bun 22; creat 12.30;  k+ 5.2; na++ 141; ion ca 1.14;  02-13-23: wbc 3.8; hgb 9.7; hct 28.7; mcv 101.1 plt 126; glucose 67; bun 18; creat 6.28; k+ 2.9; na++ 137; ca 8.8; gfr 9; protein 6.9; albumin 3.2 mag 1.8; occult blood: neg 02-23-23: hgb 9.8; hct 29.7 glucose 110; bun 24; creat 8.32 k+ 3.6; na++ 139; ca  9.6 gfr 6  06-11-23: wbc 4.4; hgb 11.7; hct 35.2; mcv 102.3 plt 88; glucose 160; bun 20; creat 4.71; k+ 3.5; na++ 137; ca 8.4; gfr 12   06-13-23: wbc 3.6; hgb 13.0; hct 39.2; mcv 102.6 plt 110; glucose 103; bun 11; creat 4.13; k+ 3.3; na++ 138; ca 8.6; gfr 14; d-dimer 0.54; CRP <0.5  06-16-23: glucose 99; bun 40; creat 10.88; k+ 4.6; na++ 136; ca 8.8; gfr 5  NO NEW LABS.    Review of Systems  Constitutional:  Negative for malaise/fatigue.  Respiratory:  Negative for cough and shortness of breath.   Cardiovascular:  Negative for chest pain, palpitations and leg swelling.  Gastrointestinal:  Negative for abdominal pain, constipation and heartburn.  Musculoskeletal:  Negative for back pain, joint pain and myalgias.  Skin: Negative.   Neurological:  Negative for dizziness.  Psychiatric/Behavioral:  The patient is not nervous/anxious.     Physical Exam Constitutional:      General: He is not in acute distress.    Appearance: He is well-developed. He is not diaphoretic.  Neck:     Thyroid: No thyromegaly.  Cardiovascular:     Rate and Rhythm: Normal rate and regular rhythm.     Pulses: Normal pulses.     Heart sounds: Normal heart sounds.  Pulmonary:     Effort: Pulmonary effort is normal. No respiratory distress.     Breath sounds: Normal breath sounds.  Abdominal:     General: Bowel sounds are normal. There is no distension.     Palpations: Abdomen is soft.     Tenderness: There is no abdominal tenderness.  Musculoskeletal:        General: Normal range of motion.     Cervical back: Neck supple.     Right lower leg: No edema.     Left lower leg: No edema.  Lymphadenopathy:     Cervical: No cervical adenopathy.  Skin:    General: Skin is warm and dry.     Comments:  Left arm fistula +thrill+bruit      Neurological:     Mental Status: He is alert. Mental status is at baseline.     Comments: 02-27-23: SLUMS 2/30  Stutters   Psychiatric:        Mood and Affect: Mood normal.     ASSESSMENT/ PLAN:  TODAY  Thrombocytopenia: plt 110  2. Aortic atherosclerosis (ct 12-30-21) is on asa and statin  3. 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 : is on hemodialysis three days weekly; is no longer on fluid restriction as he is unable to adhere; renvela 2400 mg three times daily   PREVIOUS   4. Other specified hypothyroidism: tsh 5.861 will continue synthroid 125 mcg daily   5. 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 Major depression single episode moderate: is presently off medications.   7. Osteopenia: t score -2.052 on supplements  8. GERD without esophagitis: will continue prilosec 40 mg daily   9. History of colon cancder: s/p colectomy last seen by GI on 12-19-21; ct scan done 12-30-21  10.  Dyslipidemia associate with type 2 diabetes mellitus: ldl 41; trig 63; will continue lipitor 20 mg daily lovaza 2 gm daily   11. Vitamin B 12 deficiency: is on daily supplement  12. Secondary hyperparathyroidism of renal origin: will monitor   13. Bilateral lower extremity edema: will continue demadex 20 mg four times weekly  14. Type 2 diabetes mellitus with hypertension and end stage renal disease on hemodialysis: hgb A1c 5.0; will continue basaglar 8 units nightly is on asa and statin  Synthia Innocent NP [facility]  call [phone]

## 2023-06-20 DIAGNOSIS — Z992 Dependence on renal dialysis: Secondary | ICD-10-CM | POA: Diagnosis not present

## 2023-06-20 DIAGNOSIS — N2581 Secondary hyperparathyroidism of renal origin: Secondary | ICD-10-CM | POA: Diagnosis not present

## 2023-06-20 DIAGNOSIS — D631 Anemia in chronic kidney disease: Secondary | ICD-10-CM | POA: Diagnosis not present

## 2023-06-20 DIAGNOSIS — D509 Iron deficiency anemia, unspecified: Secondary | ICD-10-CM | POA: Diagnosis not present

## 2023-06-20 DIAGNOSIS — N250 Renal osteodystrophy: Secondary | ICD-10-CM | POA: Diagnosis not present

## 2023-06-20 DIAGNOSIS — N186 End stage renal disease: Secondary | ICD-10-CM | POA: Diagnosis not present

## 2023-06-23 DIAGNOSIS — Z992 Dependence on renal dialysis: Secondary | ICD-10-CM | POA: Diagnosis not present

## 2023-06-23 DIAGNOSIS — N186 End stage renal disease: Secondary | ICD-10-CM | POA: Diagnosis not present

## 2023-06-23 DIAGNOSIS — N2581 Secondary hyperparathyroidism of renal origin: Secondary | ICD-10-CM | POA: Diagnosis not present

## 2023-06-23 DIAGNOSIS — D631 Anemia in chronic kidney disease: Secondary | ICD-10-CM | POA: Diagnosis not present

## 2023-06-23 DIAGNOSIS — D509 Iron deficiency anemia, unspecified: Secondary | ICD-10-CM | POA: Diagnosis not present

## 2023-06-23 DIAGNOSIS — N250 Renal osteodystrophy: Secondary | ICD-10-CM | POA: Diagnosis not present

## 2023-06-25 DIAGNOSIS — D631 Anemia in chronic kidney disease: Secondary | ICD-10-CM | POA: Diagnosis not present

## 2023-06-25 DIAGNOSIS — N186 End stage renal disease: Secondary | ICD-10-CM | POA: Diagnosis not present

## 2023-06-25 DIAGNOSIS — D509 Iron deficiency anemia, unspecified: Secondary | ICD-10-CM | POA: Diagnosis not present

## 2023-06-25 DIAGNOSIS — Z992 Dependence on renal dialysis: Secondary | ICD-10-CM | POA: Diagnosis not present

## 2023-06-25 DIAGNOSIS — N2581 Secondary hyperparathyroidism of renal origin: Secondary | ICD-10-CM | POA: Diagnosis not present

## 2023-06-25 DIAGNOSIS — N250 Renal osteodystrophy: Secondary | ICD-10-CM | POA: Diagnosis not present

## 2023-06-27 DIAGNOSIS — D631 Anemia in chronic kidney disease: Secondary | ICD-10-CM | POA: Diagnosis not present

## 2023-06-27 DIAGNOSIS — N250 Renal osteodystrophy: Secondary | ICD-10-CM | POA: Diagnosis not present

## 2023-06-27 DIAGNOSIS — N186 End stage renal disease: Secondary | ICD-10-CM | POA: Diagnosis not present

## 2023-06-27 DIAGNOSIS — D509 Iron deficiency anemia, unspecified: Secondary | ICD-10-CM | POA: Diagnosis not present

## 2023-06-27 DIAGNOSIS — Z992 Dependence on renal dialysis: Secondary | ICD-10-CM | POA: Diagnosis not present

## 2023-06-27 DIAGNOSIS — N2581 Secondary hyperparathyroidism of renal origin: Secondary | ICD-10-CM | POA: Diagnosis not present

## 2023-06-30 DIAGNOSIS — Z992 Dependence on renal dialysis: Secondary | ICD-10-CM | POA: Diagnosis not present

## 2023-06-30 DIAGNOSIS — N186 End stage renal disease: Secondary | ICD-10-CM | POA: Diagnosis not present

## 2023-06-30 DIAGNOSIS — N2581 Secondary hyperparathyroidism of renal origin: Secondary | ICD-10-CM | POA: Diagnosis not present

## 2023-06-30 DIAGNOSIS — D631 Anemia in chronic kidney disease: Secondary | ICD-10-CM | POA: Diagnosis not present

## 2023-06-30 DIAGNOSIS — D509 Iron deficiency anemia, unspecified: Secondary | ICD-10-CM | POA: Diagnosis not present

## 2023-06-30 DIAGNOSIS — N250 Renal osteodystrophy: Secondary | ICD-10-CM | POA: Diagnosis not present

## 2023-07-02 DIAGNOSIS — D631 Anemia in chronic kidney disease: Secondary | ICD-10-CM | POA: Diagnosis not present

## 2023-07-02 DIAGNOSIS — Z992 Dependence on renal dialysis: Secondary | ICD-10-CM | POA: Diagnosis not present

## 2023-07-02 DIAGNOSIS — N250 Renal osteodystrophy: Secondary | ICD-10-CM | POA: Diagnosis not present

## 2023-07-02 DIAGNOSIS — N186 End stage renal disease: Secondary | ICD-10-CM | POA: Diagnosis not present

## 2023-07-02 DIAGNOSIS — D509 Iron deficiency anemia, unspecified: Secondary | ICD-10-CM | POA: Diagnosis not present

## 2023-07-02 DIAGNOSIS — N2581 Secondary hyperparathyroidism of renal origin: Secondary | ICD-10-CM | POA: Diagnosis not present

## 2023-07-04 DIAGNOSIS — D631 Anemia in chronic kidney disease: Secondary | ICD-10-CM | POA: Diagnosis not present

## 2023-07-04 DIAGNOSIS — N250 Renal osteodystrophy: Secondary | ICD-10-CM | POA: Diagnosis not present

## 2023-07-04 DIAGNOSIS — D509 Iron deficiency anemia, unspecified: Secondary | ICD-10-CM | POA: Diagnosis not present

## 2023-07-04 DIAGNOSIS — N2581 Secondary hyperparathyroidism of renal origin: Secondary | ICD-10-CM | POA: Diagnosis not present

## 2023-07-04 DIAGNOSIS — N186 End stage renal disease: Secondary | ICD-10-CM | POA: Diagnosis not present

## 2023-07-04 DIAGNOSIS — Z992 Dependence on renal dialysis: Secondary | ICD-10-CM | POA: Diagnosis not present

## 2023-07-07 DIAGNOSIS — N250 Renal osteodystrophy: Secondary | ICD-10-CM | POA: Diagnosis not present

## 2023-07-07 DIAGNOSIS — N186 End stage renal disease: Secondary | ICD-10-CM | POA: Diagnosis not present

## 2023-07-07 DIAGNOSIS — D631 Anemia in chronic kidney disease: Secondary | ICD-10-CM | POA: Diagnosis not present

## 2023-07-07 DIAGNOSIS — D509 Iron deficiency anemia, unspecified: Secondary | ICD-10-CM | POA: Diagnosis not present

## 2023-07-07 DIAGNOSIS — Z992 Dependence on renal dialysis: Secondary | ICD-10-CM | POA: Diagnosis not present

## 2023-07-07 DIAGNOSIS — N2581 Secondary hyperparathyroidism of renal origin: Secondary | ICD-10-CM | POA: Diagnosis not present

## 2023-07-09 DIAGNOSIS — N250 Renal osteodystrophy: Secondary | ICD-10-CM | POA: Diagnosis not present

## 2023-07-09 DIAGNOSIS — Z992 Dependence on renal dialysis: Secondary | ICD-10-CM | POA: Diagnosis not present

## 2023-07-09 DIAGNOSIS — N2581 Secondary hyperparathyroidism of renal origin: Secondary | ICD-10-CM | POA: Diagnosis not present

## 2023-07-09 DIAGNOSIS — N186 End stage renal disease: Secondary | ICD-10-CM | POA: Diagnosis not present

## 2023-07-09 DIAGNOSIS — D509 Iron deficiency anemia, unspecified: Secondary | ICD-10-CM | POA: Diagnosis not present

## 2023-07-09 DIAGNOSIS — D631 Anemia in chronic kidney disease: Secondary | ICD-10-CM | POA: Diagnosis not present

## 2023-07-11 DIAGNOSIS — N186 End stage renal disease: Secondary | ICD-10-CM | POA: Diagnosis not present

## 2023-07-11 DIAGNOSIS — D509 Iron deficiency anemia, unspecified: Secondary | ICD-10-CM | POA: Diagnosis not present

## 2023-07-11 DIAGNOSIS — N250 Renal osteodystrophy: Secondary | ICD-10-CM | POA: Diagnosis not present

## 2023-07-11 DIAGNOSIS — Z992 Dependence on renal dialysis: Secondary | ICD-10-CM | POA: Diagnosis not present

## 2023-07-11 DIAGNOSIS — N2581 Secondary hyperparathyroidism of renal origin: Secondary | ICD-10-CM | POA: Diagnosis not present

## 2023-07-11 DIAGNOSIS — D631 Anemia in chronic kidney disease: Secondary | ICD-10-CM | POA: Diagnosis not present

## 2023-07-14 DIAGNOSIS — N186 End stage renal disease: Secondary | ICD-10-CM | POA: Diagnosis not present

## 2023-07-14 DIAGNOSIS — N250 Renal osteodystrophy: Secondary | ICD-10-CM | POA: Diagnosis not present

## 2023-07-14 DIAGNOSIS — D631 Anemia in chronic kidney disease: Secondary | ICD-10-CM | POA: Diagnosis not present

## 2023-07-14 DIAGNOSIS — N2581 Secondary hyperparathyroidism of renal origin: Secondary | ICD-10-CM | POA: Diagnosis not present

## 2023-07-14 DIAGNOSIS — Z992 Dependence on renal dialysis: Secondary | ICD-10-CM | POA: Diagnosis not present

## 2023-07-14 DIAGNOSIS — D509 Iron deficiency anemia, unspecified: Secondary | ICD-10-CM | POA: Diagnosis not present

## 2023-07-15 ENCOUNTER — Encounter: Payer: Self-pay | Admitting: Adult Health

## 2023-07-15 ENCOUNTER — Non-Acute Institutional Stay (SKILLED_NURSING_FACILITY): Payer: Medicare Other | Admitting: Adult Health

## 2023-07-15 DIAGNOSIS — Z992 Dependence on renal dialysis: Secondary | ICD-10-CM | POA: Diagnosis not present

## 2023-07-15 DIAGNOSIS — F321 Major depressive disorder, single episode, moderate: Secondary | ICD-10-CM | POA: Diagnosis not present

## 2023-07-15 DIAGNOSIS — D631 Anemia in chronic kidney disease: Secondary | ICD-10-CM

## 2023-07-15 DIAGNOSIS — E034 Atrophy of thyroid (acquired): Secondary | ICD-10-CM

## 2023-07-15 DIAGNOSIS — N186 End stage renal disease: Secondary | ICD-10-CM

## 2023-07-15 NOTE — Progress Notes (Signed)
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 There are no reports of pain present. There are no reports of anxiety or depressive thoughts.   Past Medical History:  Diagnosis Date   Abnormal CT scan, kidney 10/06/2011   Acute pyelonephritis 10/07/2011   Anemia    normocytic   Anxiety    mental retardation   Bladder wall thickening 10/06/2011   BPH (benign prostatic hypertrophy)    Diabetes mellitus    Dialysis patient (HCC)    Tuesday, Thursday and Saturday,    DVT of leg (deep venous thrombosis) (HCC) 12/25/2016   Edema     history of lower extremity edema   GERD (gastroesophageal reflux disease)    Heme positive stool    Hydronephrosis    Hyperkalemia    Hyperlipidemia    Hypernatremia    Hypertension    Hypothyroidism    Impaired speech    Infected prosthetic vascular graft (HCC)    MR (mental retardation)    Muscle weakness    Obstructive uropathy    Perinephric abscess 10/07/2011   Poor historian poor historian   Primary colorectal adenocarcinoma (HCC) 04/2017   S/p total colectomy   Protein calorie malnutrition (HCC)    Pyelonephritis    Renal failure (ARF), acute on chronic (HCC)    Renal insufficiency    chronic history   Sepsis (HCC)    Smoking    Uremia    Urinary retention    UTI (lower urinary tract infection) 10/06/2011    Past Surgical History:  Procedure Laterality Date   A/V  FISTULAGRAM N/A 08/13/2018   Procedure: A/V FISTULAGRAM - Right Upper;  Surgeon: Sherren Kerns, MD;  Location: [facility] INVASIVE CV LAB;  Service: Cardiovascular;  Laterality: N/A;   A/V FISTULAGRAM N/A 11/22/2018   Procedure: A/V FISTULAGRAM - Right Upper;  Surgeon: Maeola Harman, MD;  Location: [facility] INVASIVE CV LAB;  Service: Cardiovascular;  Laterality: N/A;   A/V FISTULAGRAM Left 10/08/2022   Procedure: A/V Fistulagram;  Surgeon: Victorino Sparrow, MD;  Location: [facility] INVASIVE CV LAB;  Service: Cardiovascular;  Laterality: Left;   A/V FISTULAGRAM Left 05/06/2023   Procedure: A/V Fistulagram;  Surgeon: Leonie Douglas, MD;  Location: [facility] INVASIVE CV LAB;  Service: Cardiovascular;  Laterality: Left;   AV FISTULA PLACEMENT Left 07/06/2015   Procedure:  INSERTION LEFT ARM ARTERIOVENOUS GORTEX GRAFT;  Surgeon: Chuck Hint, MD;  Location: [facility] OR;  Service: Vascular;  Laterality: Left;   AV FISTULA PLACEMENT Right 02/26/2016   Procedure: ARTERIOVENOUS (AV) FISTULA CREATION ;  Surgeon: Chuck Hint, MD;  Location: [facility] OR;  Service: Vascular;  Laterality: Right;   AV FISTULA PLACEMENT Right 11/25/2018   Procedure: INSERTION OF ARTERIOVENOUS (AV) ARTEGRAFT RIGHT UPPER ARM;  Surgeon: Maeola Harman, MD;  Location: [facility] OR;  Service: Vascular;  Laterality: Right;  AV FISTULA PLACEMENT Left 05/28/2021   Procedure: LEFT ARM ARTERIOVENOUS (AV) FISTULA;  Surgeon: Larina Earthly, MD;  Location: [facility] ORS;  Service: Vascular;  Laterality: Left;   AVGG REMOVAL Left 10/09/2015   Procedure: REMOVAL OF ARTERIOVENOUS GORETEX GRAFT (AVGG) Evacuation of Lymphocele, Vein Patch angioplasty of brachial artery.;  Surgeon: Chuck Hint, MD;  Location: [facility] OR;  Service: Vascular;  Laterality: Left;   BASCILIC VEIN TRANSPOSITION Right 02/26/2016   Procedure: Right BASCILIC VEIN TRANSPOSITION;  Surgeon: Chuck Hint, MD;  Location: [facility] OR;  Service: Vascular;  Laterality: Right;    BASCILIC VEIN TRANSPOSITION Left 07/30/2021   Procedure: LEFT ARM SECOND STAGE BASILIC VEIN TRANSPOSITION;  Surgeon: Larina Earthly, MD;  Location: [facility] ORS;  Service: Vascular;  Laterality: Left;   CIRCUMCISION N/A 01/04/2014   Procedure: CIRCUMCISION ADULT (procedure #1);  Surgeon: Ky Barban, MD;  Location: [facility] ORS;  Service: Urology;  Laterality: N/A;   COLECTOMY N/A 05/04/2017   Procedure: TOTAL COLECTOMY;  Surgeon: Franky Macho, MD;  Location: [facility] ORS;  Service: General;  Laterality: N/A;   COLONOSCOPY N/A 04/27/2017   Surgeon: Corbin Ade, MD; annular mass in the ascending colon likely representing cancer biopsied, multiple 6-22 mm polyps removed, clean rectum.  Pathology with multiple tubular adenomas, high-grade dysplasia noted in ascending colon and splenic flexure.   CYSTOSCOPY W/ RETROGRADES Bilateral 06/29/2015   Procedure: CYSTOSCOPY, DILATION OF URETHRAL STRICTURE WITH BILATERAL RETROGRADE PYELOGRAM,SUPRAPUBIC TUBE CHANGE;  Surgeon: Jerilee Field, MD;  Location: [facility] ORS;  Service: Urology;  Laterality: Bilateral;   CYSTOSCOPY WITH URETHRAL DILATATION N/A 12/29/2013   Procedure: CYSTOSCOPY WITH URETHRAL DILATATION;  Surgeon: Ky Barban, MD;  Location: [facility]S;  Service: Urology;  Laterality: N/A;   ESOPHAGOGASTRODUODENOSCOPY N/A 04/27/2017   Procedure: ESOPHAGOGASTRODUODENOSCOPY (EGD);  Surgeon: Corbin Ade, MD;  Location: [facility];  Service: Endoscopy;  Laterality: N/A;   FLEXIBLE SIGMOIDOSCOPY N/A 12/02/2021   Procedure: FLEXIBLE SIGMOIDOSCOPY;  Surgeon: Lanelle Bal, DO;  Location: [facility];  Service: Endoscopy;  Laterality: N/A;  10:30am, dialysis patient   INSERTION OF DIALYSIS CATHETER Right 11/25/2018   Procedure: INSERTION OF DIALYSIS CATHETER RIGHT INTERNAL JUGULAR;  Surgeon: Maeola Harman, MD;  Location: [facility] OR;  Service: Vascular;  Laterality: Right;   IR AV DIALY SHUNT INTRO NEEDLE/INTRACATH INITIAL W/PTA/IMG RIGHT Right  09/07/2018   IR FLUORO GUIDE CV LINE RIGHT  10/16/2020   IR FLUORO GUIDE CV LINE RIGHT  10/03/2022   IR REMOVAL TUN CV CATH W/O FL  01/12/2019   IR THROMBECTOMY AV FISTULA W/THROMBOLYSIS/PTA INC/SHUNT/IMG RIGHT Right 04/26/2018   IR US GUIDE VASC ACCESS RIGHT  04/26/2018   IR US GUIDE VASC ACCESS RIGHT  09/07/2018   IR US GUIDE VASC ACCESS RIGHT  10/16/2020   IR US GUIDE VASC ACCESS RIGHT  10/16/2020   IR US GUIDE VASC ACCESS RIGHT  10/03/2022   ORIF FEMUR FRACTURE Right 11/22/2016   Procedure: OPEN REDUCTION INTERNAL FIXATION (ORIF) DISTAL FEMUR FRACTURE;  Surgeon: Samson Frederic, MD;  Location: [facility] OR;  Service: Orthopedics;  Laterality: Right;   PATCH ANGIOPLASTY Right 12/10/2017   Procedure: PATCH ANGIOPLASTY;  Surgeon: Chuck Hint, MD;  Location: [facility] OR;  Service: Vascular;  Laterality: Right;   PERIPHERAL VASCULAR BALLOON ANGIOPLASTY  08/13/2018   Procedure: PERIPHERAL VASCULAR BALLOON ANGIOPLASTY;  Surgeon: Sherren Kerns, MD;  Location: [facility] INVASIVE CV LAB;  Service: Cardiovascular;;  right AV fistula    PERIPHERAL VASCULAR BALLOON ANGIOPLASTY  11/22/2018  Procedure: PERIPHERAL VASCULAR BALLOON ANGIOPLASTY;  Surgeon: Maeola Harman, MD;  Location: [facility] INVASIVE CV LAB;  Service: Cardiovascular;;  rt AV fistula   PERIPHERAL VASCULAR BALLOON ANGIOPLASTY  10/08/2022   Procedure: PERIPHERAL VASCULAR BALLOON ANGIOPLASTY;  Surgeon: Victorino Sparrow, MD;  Location: [facility] INVASIVE CV LAB;  Service: Cardiovascular;;  arterial anastamosis   PERIPHERAL VASCULAR CATHETERIZATION N/A 10/08/2015   Procedure: A/V Shuntogram;  Surgeon: Chuck Hint, MD;  Location: [facility] INVASIVE CV LAB;  Service: Cardiovascular;  Laterality: N/A;   PERIPHERAL VASCULAR INTERVENTION Left 10/08/2022   Procedure: PERIPHERAL VASCULAR INTERVENTION;  Surgeon: Victorino Sparrow, MD;  Location: [facility] INVASIVE CV LAB;  Service: Cardiovascular;  Laterality: Left;  left AVF   REMOVAL OF A DIALYSIS CATHETER N/A  12/03/2021   Procedure: MINOR REMOVAL OF A TUNNELED DIALYSIS CATHETER;  Surgeon: Larina Earthly, MD;  Location: [facility]S;  Service: Vascular;  Laterality: N/A;   REMOVAL OF A DIALYSIS CATHETER N/A 11/18/2022   Procedure: MINOR REMOVAL OF A TUNNELED DIALYSIS CATHETER;  Surgeon: Larina Earthly, MD;  Location: [facility];  Service: Vascular;  Laterality: N/A;   THROMBECTOMY W/ EMBOLECTOMY Right 12/10/2017   Procedure: THROMBECTOMY REVISION RIGHT ARM  ARTERIOVENOUS FISTULA;  Surgeon: Chuck Hint, MD;  Location: [facility] OR;  Service: Vascular;  Laterality: Right;   TRANSURETHRAL RESECTION OF PROSTATE N/A 01/04/2014   Procedure: TRANSURETHRAL RESECTION OF THE PROSTATE (TURP) (procedure #2);  Surgeon: Ky Barban, MD;  Location: [facility];  Service: Urology;  Laterality: N/A;    Social History   Socioeconomic History   Marital status: Single    Spouse name: Not on file   Number of children: Not on file   Years of education: Not on file   Highest education level: Not on file  Occupational History   Occupation: retired   Tobacco Use   Smoking status: Never   Smokeless tobacco: Never  Vaping Use   Vaping status: Never Used  Substance and Sexual Activity   Alcohol use: No   Drug use: No   Sexual activity: Not Currently  Other Topics Concern   Not on file  Social History Narrative   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   Long term resident of [facility]    Social Determinants of Health   Financial Resource Strain: Low Risk  (02/18/2018)   Overall Financial Resource Strain (CARDIA)    Difficulty of Paying Living Expenses: Not hard at all  Food Insecurity: No Food Insecurity (02/18/2018)   Hunger Vital Sign    Worried About Running Out of Food in the Last Year: Never true    Ran Out of Food in the Last Year: Never true  Transportation Needs: No Transportation Needs (02/18/2018)   PRAPARE - Administrator, Civil Service (Medical): No    Lack of  Transportation (Non-Medical): No  Physical Activity: Unknown (09/15/2019)   Exercise Vital Sign    Days of Exercise per Week: Not on file    Minutes of Exercise per Session: 0 min  Stress: No Stress Concern Present (02/18/2018)   [employer] of Occupational Health - Occupational Stress Questionnaire    Feeling of Stress : Not at all  Social Connections: Unknown (09/15/2019)   Social Connection and Isolation Panel [NHANES]    Frequency of Communication with Friends and Family: Not on file    Frequency of Social Gatherings with Friends and Family: Never    Attends Religious Services: Not on file  Active Member of Clubs or Organizations: Not on file    Attends Club or Organization Meetings: Not on file    Marital Status: Not on file  Intimate Partner Violence: Not At Risk (02/18/2018)   Humiliation, Afraid, Rape, and Kick questionnaire    Fear of Current or Ex-Partner: No    Emotionally Abused: No    Physically Abused: No    Sexually Abused: No   Family History  Problem Relation Age of Onset   Cancer Mother    Colon cancer Neg Hx       VITAL SIGNS BP 132/68   Pulse 68   Temp (!) 97.2 F (36.2 C)   Resp 20   Ht 5\' 8"  (1.727 m)   Wt 203 lb (92.1 kg)   SpO2 98%   BMI 30.87 kg/m   Outpatient Encounter Medications as of 07/15/2023  Medication Sig   aspirin EC 81 MG tablet Take 81 mg by mouth daily. 0900   atorvastatin (LIPITOR) 20 MG tablet Take 20 mg by mouth at bedtime. 2200   Insulin Glargine (BASAGLAR KWIKPEN) 100 UNIT/ML Inject 8 Units into the skin at bedtime. 2200   Insulin Pen Needle (BD AUTOSHIELD DUO) 30G X 5 MM MISC by Does not apply route. 3/16"   levothyroxine (SYNTHROID) 125 MCG tablet Take 125 mcg by mouth at bedtime. 10 am, Special Instructions: Must be given (1) hour apart from food or other medications per pharmacy recommendations   midodrine (PROAMATINE) 10 MG tablet Take 10 mg by mouth See admin instructions. Send med with resident on  Tues/Thurs/Sat  to dialysis. do not give at [facility], give to resident to take with him. send with 5mg  to equal 15mg . 6:00   midodrine (PROAMATINE) 5 MG tablet Take 5 mg by mouth See admin instructions. Send with resident on dialysis days.  Tues/Thurs/Sat  Take along with 10 mg to equal 15 mg 6:00   NON FORMULARY Diet: Regular diet   Nutritional Supplements (,FEEDING SUPPLEMENT, PROSOURCE PLUS) liquid Take 30 mLs by mouth 2 (two) times daily between meals. 1600 and 2200   omega-3 acid ethyl esters (LOVAZA) 1 g capsule Take 2 g by mouth at bedtime. 2200   omeprazole (PRILOSEC) 40 MG capsule Take 40 mg by mouth at bedtime. 2200   polyethylene glycol (MIRALAX / GLYCOLAX) packet Take 17 g by mouth daily as needed for mild constipation.    sevelamer carbonate (RENVELA) 800 MG tablet Take 3 tablets (2,400 mg total) by mouth 3 (three) times daily with meals.   tamsulosin (FLOMAX) 0.4 MG CAPS capsule Take 0.4 mg by mouth at bedtime. Give 30 minutes after a meal. Do not crush or chew   torsemide (DEMADEX) 20 MG tablet Take 20 mg by mouth See admin instructions. 20mg  daily at 2000 on Sunday, Monday, Wednesday, and Friday only.   vitamin B-12 (CYANOCOBALAMIN) 1000 MCG tablet Take 1,000 mcg by mouth at bedtime.   No facility-administered encounter medications on file as of 07/15/2023.     SIGNIFICANT DIAGNOSTIC EXAMS  PREVIOUS   06-11-22: dexa: t score: -2.052  NO NEW EXAMS.    LABS REVIEWED PREVIOUS;      12 -4-23: wbc 4.3; hgb 12.9; hct 41.1; mcv 108.4 plt 108; glucose 112; bun 44; creat 16.87; k+ 4.6; na++ 139; ca 9.3 gfr 3; protein 7.0; albumin 3.5; hgb A1c 5.0; tsh 5.861 chol 89; ldl 41; trig 68; hdl 35 10-08-22: glucose 101; bun 22; creat 12.30; k+ 5.2; na++ 141; ion ca 1.14;  02-13-23:  wbc 3.8; hgb 9.7; hct 28.7; mcv 101.1 plt 126; glucose 67; bun 18; creat 6.28; k+ 2.9; na++ 137; ca 8.8; gfr 9; protein 6.9; albumin 3.2 mag 1.8; occult blood: neg 02-23-23: hgb 9.8; hct 29.7 glucose 110; bun 24; creat 8.32 k+  3.6; na++ 139; ca 9.6 gfr 6  06-11-23: wbc 4.4; hgb 11.7; hct 35.2; mcv 102.3 plt 88; glucose 160; bun 20; creat 4.71; k+ 3.5; na++ 137; ca 8.4; gfr 12   06-13-23: wbc 3.6; hgb 13.0; hct 39.2; mcv 102.6 plt 110; glucose 103; bun 11; creat 4.13; k+ 3.3; na++ 138; ca 8.6; gfr 14; d-dimer 0.54; CRP <0.5  06-16-23: glucose 99; bun 40; creat 10.88; k+ 4.6; na++ 136; ca 8.8; gfr 5  NO NEW LABS.    Review of Systems  Constitutional:  Negative for malaise/fatigue.  Respiratory:  Negative for cough and shortness of breath.   Cardiovascular:  Negative for chest pain, palpitations and leg swelling.  Gastrointestinal:  Negative for abdominal pain, constipation and heartburn.  Musculoskeletal:  Negative for back pain, joint pain and myalgias.  Skin: Negative.   Neurological:  Negative for dizziness.  Psychiatric/Behavioral:  The patient is not nervous/anxious.    Physical Exam Constitutional:      General: He is not in acute distress.    Appearance: He is well-developed. He is not diaphoretic.  Neck:     Thyroid: No thyromegaly.  Cardiovascular:     Rate and Rhythm: Normal rate and regular rhythm.     Pulses: Normal pulses.     Heart sounds: Normal heart sounds.  Pulmonary:     Effort: Pulmonary effort is normal. No respiratory distress.     Breath sounds: Normal breath sounds.  Abdominal:     General: Bowel sounds are normal. There is no distension.     Palpations: Abdomen is soft.     Tenderness: There is no abdominal tenderness.  Musculoskeletal:        General: Normal range of motion.     Cervical back: Neck supple.     Right lower leg: No edema.     Left lower leg: No edema.  Lymphadenopathy:     Cervical: No cervical adenopathy.  Skin:    General: Skin is warm and dry.     Comments: Left arm fistula +thrill+bruit   Neurological:     Mental Status: He is alert. Mental status is at baseline.     Comments:  02-27-23: SLUMS 2/30  Stutters    Psychiatric:        Mood and Affect: Mood  normal.      ASSESSMENT/ PLAN:  TODAY  1.Other specified hypothyroidism: tsh 5.861 will continue synthroid 125 mcg daily   2. 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 Major depression single episode moderate: is presently off medications.   PREVIOUS   4. Osteopenia: t score -2.052 on supplements  5. GERD without esophagitis: will continue prilosec 40 mg daily   6. History of colon cancder: s/p colectomy last seen by GI on 12-19-21; ct scan done 12-30-21  7. Dyslipidemia associate with type 2 diabetes mellitus: ldl 41; trig 63; will continue lipitor 20 mg daily lovaza 2 gm daily   8. Vitamin B 12 deficiency: is on daily supplement  9. Secondary hyperparathyroidism of renal origin: will monitor   10. Bilateral lower extremity edema: will continue demadex 20 mg four times weekly  11. 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 Thrombocytopenia: plt 110  13. Aortic atherosclerosis (ct 12-30-21) is on asa and statin  14. 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 : is on hemodialysis three days weekly; is no longer on fluid restriction as he is unable to adhere; renvela 2400 mg three times daily   Will check hgb A1c and lipids     Synthia Innocent NP [facility]  call [phone]

## 2023-07-16 ENCOUNTER — Encounter: Payer: Self-pay | Admitting: Adult Health

## 2023-07-16 ENCOUNTER — Non-Acute Institutional Stay (SKILLED_NURSING_FACILITY): Payer: Self-pay | Admitting: Adult Health

## 2023-07-16 ENCOUNTER — Other Ambulatory Visit (HOSPITAL_COMMUNITY)
Admission: RE | Admit: 2023-07-16 | Discharge: 2023-07-16 | Disposition: A | Discharge status: Home / Self Care | Payer: Medicare Other | Source: Skilled Nursing Facility | Attending: Adult Health | Admitting: Adult Health

## 2023-07-16 DIAGNOSIS — E1169 Type 2 diabetes mellitus with other specified complication: Secondary | ICD-10-CM

## 2023-07-16 DIAGNOSIS — D631 Anemia in chronic kidney disease: Secondary | ICD-10-CM | POA: Diagnosis not present

## 2023-07-16 DIAGNOSIS — N186 End stage renal disease: Secondary | ICD-10-CM | POA: Diagnosis not present

## 2023-07-16 DIAGNOSIS — E785 Hyperlipidemia, unspecified: Secondary | ICD-10-CM

## 2023-07-16 DIAGNOSIS — N2581 Secondary hyperparathyroidism of renal origin: Secondary | ICD-10-CM | POA: Diagnosis not present

## 2023-07-16 DIAGNOSIS — N250 Renal osteodystrophy: Secondary | ICD-10-CM | POA: Diagnosis not present

## 2023-07-16 DIAGNOSIS — Z992 Dependence on renal dialysis: Secondary | ICD-10-CM | POA: Diagnosis not present

## 2023-07-16 DIAGNOSIS — D509 Iron deficiency anemia, unspecified: Secondary | ICD-10-CM | POA: Diagnosis not present

## 2023-07-16 LAB — LIPID PANEL
Cholesterol: 134 mg/dL (ref 0–200)
HDL: 24 mg/dL — ABNORMAL LOW (ref >40)
LDL Cholesterol: UNDETERMINED mg/dL (ref 0–99)
Total CHOL/HDL Ratio: 5.6 RATIO
Triglycerides: 486 mg/dL — ABNORMAL HIGH (ref <150)
VLDL: UNDETERMINED mg/dL (ref 0–40)

## 2023-07-16 LAB — LDL CHOLESTEROL, DIRECT: Direct LDL: 39 mg/dL (ref 0–99)

## 2023-07-16 NOTE — Progress Notes (Signed)
Location:  [facility] Room Number: 26 D Place of Service:  SNF (31)   CODE STATUS: Full Code  No Known Allergies  Chief Complaint  Patient presents with   Acute Visit     follow up labs     HPI:  His trig are 486 with a total cholesterol of 134. He eats whatever he wants. He is on a regular diet. He is unable to adhere to a fluid restriction.   Past Medical History:  Diagnosis Date   Abnormal CT scan, kidney 10/06/2011   Acute pyelonephritis 10/07/2011   Anemia    normocytic   Anxiety    mental retardation   Bladder wall thickening 10/06/2011   BPH (benign prostatic hypertrophy)    Diabetes mellitus    Dialysis patient (HCC)    Tuesday, Thursday and Saturday,    DVT of leg (deep venous thrombosis) (HCC) 12/25/2016   Edema     history of lower extremity edema   GERD (gastroesophageal reflux disease)    Heme positive stool    Hydronephrosis    Hyperkalemia    Hyperlipidemia    Hypernatremia    Hypertension    Hypothyroidism    Impaired speech    Infected prosthetic vascular graft (HCC)    MR (mental retardation)    Muscle weakness    Obstructive uropathy    Perinephric abscess 10/07/2011   Poor historian poor historian   Primary colorectal adenocarcinoma (HCC) 04/2017   S/p total colectomy   Protein calorie malnutrition (HCC)    Pyelonephritis    Renal failure (ARF), acute on chronic (HCC)    Renal insufficiency    chronic history   Sepsis (HCC)    Smoking    Uremia    Urinary retention    UTI (lower urinary tract infection) 10/06/2011    Past Surgical History:  Procedure Laterality Date   A/V FISTULAGRAM N/A 08/13/2018   Procedure: A/V FISTULAGRAM - Right Upper;  Surgeon: Sherren Kerns, MD;  Location: [facility] INVASIVE CV LAB;  Service: Cardiovascular;  Laterality: N/A;   A/V FISTULAGRAM N/A 11/22/2018   Procedure: A/V FISTULAGRAM - Right Upper;  Surgeon: Maeola Harman, MD;  Location: [facility] INVASIVE CV LAB;  Service:  Cardiovascular;  Laterality: N/A;   A/V FISTULAGRAM Left 10/08/2022   Procedure: A/V Fistulagram;  Surgeon: Victorino Sparrow, MD;  Location: [facility] INVASIVE CV LAB;  Service: Cardiovascular;  Laterality: Left;   A/V FISTULAGRAM Left 05/06/2023   Procedure: A/V Fistulagram;  Surgeon: Leonie Douglas, MD;  Location: [facility] INVASIVE CV LAB;  Service: Cardiovascular;  Laterality: Left;   AV FISTULA PLACEMENT Left 07/06/2015   Procedure:  INSERTION LEFT ARM ARTERIOVENOUS GORTEX GRAFT;  Surgeon: Chuck Hint, MD;  Location: [facility] OR;  Service: Vascular;  Laterality: Left;   AV FISTULA PLACEMENT Right 02/26/2016   Procedure: ARTERIOVENOUS (AV) FISTULA CREATION ;  Surgeon: Chuck Hint, MD;  Location: [facility] OR;  Service: Vascular;  Laterality: Right;   AV FISTULA PLACEMENT Right 11/25/2018   Procedure: INSERTION OF ARTERIOVENOUS (AV) ARTEGRAFT RIGHT UPPER ARM;  Surgeon: Maeola Harman, MD;  Location: [facility] OR;  Service: Vascular;  Laterality: Right;   AV FISTULA PLACEMENT Left 05/28/2021   Procedure: LEFT ARM ARTERIOVENOUS (AV) FISTULA;  Surgeon: Larina Earthly, MD;  Location: [facility] ORS;  Service: Vascular;  Laterality: Left;   AVGG REMOVAL Left 10/09/2015   Procedure: REMOVAL OF ARTERIOVENOUS GORETEX GRAFT (AVGG) Evacuation of Lymphocele, Vein Patch angioplasty of brachial artery.;  Surgeon: Chuck Hint, MD;  Location: [facility] OR;  Service: Vascular;  Laterality: Left;   BASCILIC VEIN TRANSPOSITION Right 02/26/2016   Procedure: Right BASCILIC VEIN TRANSPOSITION;  Surgeon: Chuck Hint, MD;  Location: [facility] OR;  Service: Vascular;  Laterality: Right;   BASCILIC VEIN TRANSPOSITION Left 07/30/2021   Procedure: LEFT ARM SECOND STAGE BASILIC VEIN TRANSPOSITION;  Surgeon: Larina Earthly, MD;  Location: [facility] ORS;  Service: Vascular;  Laterality: Left;   CIRCUMCISION N/A 01/04/2014   Procedure: CIRCUMCISION ADULT (procedure #1);  Surgeon: Ky Barban, MD;  Location: [facility] ORS;  Service:  Urology;  Laterality: N/A;   COLECTOMY N/A 05/04/2017   Procedure: TOTAL COLECTOMY;  Surgeon: Franky Macho, MD;  Location: [facility] ORS;  Service: General;  Laterality: N/A;   COLONOSCOPY N/A 04/27/2017   Surgeon: Corbin Ade, MD; annular mass in the ascending colon likely representing cancer biopsied, multiple 6-22 mm polyps removed, clean rectum.  Pathology with multiple tubular adenomas, high-grade dysplasia noted in ascending colon and splenic flexure.   CYSTOSCOPY W/ RETROGRADES Bilateral 06/29/2015   Procedure: CYSTOSCOPY, DILATION OF URETHRAL STRICTURE WITH BILATERAL RETROGRADE PYELOGRAM,SUPRAPUBIC TUBE CHANGE;  Surgeon: Jerilee Field, MD;  Location: [facility] ORS;  Service: Urology;  Laterality: Bilateral;   CYSTOSCOPY WITH URETHRAL DILATATION N/A 12/29/2013   Procedure: CYSTOSCOPY WITH URETHRAL DILATATION;  Surgeon: Ky Barban, MD;  Location: [facility] ORS;  Service: Urology;  Laterality: N/A;   ESOPHAGOGASTRODUODENOSCOPY N/A 04/27/2017   Procedure: ESOPHAGOGASTRODUODENOSCOPY (EGD);  Surgeon: Corbin Ade, MD;  Location: [facility] ENDO [facility];  Service: Endoscopy;  Laterality: N/A;   FLEXIBLE SIGMOIDOSCOPY N/A 12/02/2021   Procedure: FLEXIBLE SIGMOIDOSCOPY;  Surgeon: Lanelle Bal, DO;  Location: [facility] END[facility];  Service: Endoscopy;  Laterality: N/A;  10:30am, dialysis patient   INSERTION OF DIALYSIS CATHETER Right 11/25/2018   Procedure: INSERTION OF DIALYSIS CATHETER RIGHT INTERNAL JUGULAR;  Surgeon: Maeola Harman, MD;  Location: [facility] OR;  Service: Vascular;  Laterality: Right;   IR AV DIALY SHUNT INTRO NEEDLE/INTRACATH INITIAL W/PTA/IMG RIGHT Right 09/07/2018   IR FLUORO GUIDE CV LINE RIGHT  10/16/2020   IR FLUORO GUIDE CV LINE RIGHT  10/03/2022   IR REMOVAL TUN CV CATH W/O FL  01/12/2019   IR THROMBECTOMY AV FISTULA W/THROMBOLYSIS/PTA INC/SHUNT/IMG RIGHT Right 04/26/2018   IR US GUIDE VASC ACCESS RIGHT  04/26/2018   IR US GUIDE VASC ACCESS RIGHT  09/07/2018   IR US GUIDE VASC  ACCESS RIGHT  10/16/2020   IR US GUIDE VASC ACCESS RIGHT  10/16/2020   IR US GUIDE VASC ACCESS RIGHT  10/03/2022   ORIF FEMUR FRACTURE Right 11/22/2016   Procedure: OPEN REDUCTION INTERNAL FIXATION (ORIF) DISTAL FEMUR FRACTURE;  Surgeon: Samson Frederic, MD;  Location: [facility] OR;  Service: Orthopedics;  Laterality: Right;   PATCH ANGIOPLASTY Right 12/10/2017   Procedure: PATCH ANGIOPLASTY;  Surgeon: Chuck Hint, MD;  Location: [facility] OR;  Service: Vascular;  Laterality: Right;   PERIPHERAL VASCULAR BALLOON ANGIOPLASTY  08/13/2018   Procedure: PERIPHERAL VASCULAR BALLOON ANGIOPLASTY;  Surgeon: Sherren Kerns, MD;  Location: [facility] INVASIVE CV LAB;  Service: Cardiovascular;;  right AV fistula    PERIPHERAL VASCULAR BALLOON ANGIOPLASTY  11/22/2018   Procedure: PERIPHERAL VASCULAR BALLOON ANGIOPLASTY;  Surgeon: Maeola Harman, MD;  Location: [facility] INVASIVE CV LAB;  Service: Cardiovascular;;  rt AV fistula   PERIPHERAL VASCULAR BALLOON ANGIOPLASTY  10/08/2022   Procedure: PERIPHERAL VASCULAR BALLOON ANGIOPLASTY;  Surgeon: Victorino Sparrow, MD;  Location: [facility] INVASIVE CV LAB;  Service:  Cardiovascular;;  arterial anastamosis   PERIPHERAL VASCULAR CATHETERIZATION N/A 10/08/2015   Procedure: A/V Shuntogram;  Surgeon: Chuck Hint, MD;  Location: [facility] INVASIVE CV LAB;  Service: Cardiovascular;  Laterality: N/A;   PERIPHERAL VASCULAR INTERVENTION Left 10/08/2022   Procedure: PERIPHERAL VASCULAR INTERVENTION;  Surgeon: Victorino Sparrow, MD;  Location: [facility] INVASIVE CV LAB;  Service: Cardiovascular;  Laterality: Left;  left AVF   REMOVAL OF A DIALYSIS CATHETER N/A 12/03/2021   Procedure: MINOR REMOVAL OF A TUNNELED DIALYSIS CATHETER;  Surgeon: Larina Earthly, MD;  Location: [facility] ORS;  Service: Vascular;  Laterality: N/A;   REMOVAL OF A DIALYSIS CATHETER N/A 11/18/2022   Procedure: MINOR REMOVAL OF A TUNNELED DIALYSIS CATHETER;  Surgeon: Larina Earthly, MD;  Location: [facility]S;  Service: Vascular;   Laterality: N/A;   THROMBECTOMY W/ EMBOLECTOMY Right 12/10/2017   Procedure: THROMBECTOMY REVISION RIGHT ARM  ARTERIOVENOUS FISTULA;  Surgeon: Chuck Hint, MD;  Location: [facility] OR;  Service: Vascular;  Laterality: Right;   TRANSURETHRAL RESECTION OF PROSTATE N/A 01/04/2014   Procedure: TRANSURETHRAL RESECTION OF THE PROSTATE (TURP) (procedure #2);  Surgeon: Ky Barban, MD;  Location: [facility]S;  Service: Urology;  Laterality: N/A;    Social History   Socioeconomic History   Marital status: Single    Spouse name: Not on file   Number of children: Not on file   Years of education: Not on file   Highest education level: Not on file  Occupational History   Occupation: retired   Tobacco Use   Smoking status: Never   Smokeless tobacco: Never  Vaping Use   Vaping status: Never Used  Substance and Sexual Activity   Alcohol use: No   Drug use: No   Sexual activity: Not Currently  Other Topics Concern   Not on file  Social History Narrative   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   Long term resident of [facility]    Social Determinants of Health   Financial Resource Strain: Low Risk  (02/18/2018)   Overall Financial Resource Strain (CARDIA)    Difficulty of Paying Living Expenses: Not hard at all  Food Insecurity: No Food Insecurity (02/18/2018)   Hunger Vital Sign    Worried About Running Out of Food in the Last Year: Never true    Ran Out of Food in the Last Year: Never true  Transportation Needs: No Transportation Needs (02/18/2018)   PRAPARE - Administrator, Civil Service (Medical): No    Lack of Transportation (Non-Medical): No  Physical Activity: Unknown (09/15/2019)   Exercise Vital Sign    Days of Exercise per Week: Not on file    Minutes of Exercise per Session: 0 min  Stress: No Stress Concern Present (02/18/2018)   [employer] of Occupational Health - Occupational Stress Questionnaire    Feeling of Stress :  Not at all  Social Connections: Unknown (09/15/2019)   Social Connection and Isolation Panel [NHANES]    Frequency of Communication with Friends and Family: Not on file    Frequency of Social Gatherings with Friends and Family: Never    Attends Religious Services: Not on file    Active Member of Clubs or Organizations: Not on file    Attends Banker Meetings: Not on file    Marital Status: Not on file  Intimate Partner Violence: Not At Risk (02/18/2018)   Humiliation, Afraid, Rape, and Kick questionnaire    Fear of Current or Ex-Partner: No  Emotionally Abused: No    Physically Abused: No    Sexually Abused: No   Family History  Problem Relation Age of Onset   Cancer Mother    Colon cancer Neg Hx       VITAL SIGNS BP 132/68   Pulse 68   Temp (!) 97.2 F (36.2 C)   Resp 20   Ht 5\' 8"  (1.727 m)   Wt 203 lb 9.6 oz (92.4 kg)   SpO2 98%   BMI 30.96 kg/m   Outpatient Encounter Medications as of 07/16/2023  Medication Sig   aspirin EC 81 MG tablet Take 81 mg by mouth daily. 0900   Insulin Glargine (BASAGLAR KWIKPEN) 100 UNIT/ML Inject 8 Units into the skin at bedtime. 2200   Insulin Pen Needle (BD AUTOSHIELD DUO) 30G X 5 MM MISC by Does not apply route. 3/16"   levothyroxine (SYNTHROID) 125 MCG tablet Take 125 mcg by mouth at bedtime. 10 am, Special Instructions: Must be given (1) hour apart from food or other medications per pharmacy recommendations   midodrine (PROAMATINE) 10 MG tablet Take 10 mg by mouth See admin instructions. Send med with resident on  Tues/Thurs/Sat to dialysis. do not give at [facility], give to resident to take with him. send with 5mg  to equal 15mg . 6:00   midodrine (PROAMATINE) 5 MG tablet Take 5 mg by mouth See admin instructions. Send with resident on dialysis days.  Tues/Thurs/Sat  Take along with 10 mg to equal 15 mg 6:00   NON FORMULARY Diet: Regular diet   Nutritional Supplements (,FEEDING SUPPLEMENT, PROSOURCE PLUS) liquid Take 30  mLs by mouth 2 (two) times daily between meals. 1600 and 2200   omega-3 acid ethyl esters (LOVAZA) 1 g capsule Take 2 g by mouth at bedtime. 2200   omeprazole (PRILOSEC) 40 MG capsule Take 40 mg by mouth at bedtime. 2200   polyethylene glycol (MIRALAX / GLYCOLAX) packet Take 17 g by mouth daily as needed for mild constipation.    rosuvastatin (CRESTOR) 10 MG tablet Take 10 mg by mouth at bedtime.   sevelamer carbonate (RENVELA) 800 MG tablet Take 3 tablets (2,400 mg total) by mouth 3 (three) times daily with meals.   tamsulosin (FLOMAX) 0.4 MG CAPS capsule Take 0.4 mg by mouth at bedtime. Give 30 minutes after a meal. Do not crush or chew   torsemide (DEMADEX) 20 MG tablet Take 20 mg by mouth See admin instructions. 20mg  daily at 2000 on Sunday, Monday, Wednesday, and Friday only.   vitamin B-12 (CYANOCOBALAMIN) 1000 MCG tablet Take 1,000 mcg by mouth at bedtime.   [DISCONTINUED] atorvastatin (LIPITOR) 20 MG tablet Take 20 mg by mouth at bedtime. 2200   No facility-administered encounter medications on file as of 07/16/2023.     SIGNIFICANT DIAGNOSTIC EXAMS  PREVIOUS   06-11-22: dexa: t score: -2.052  NO NEW EXAMS.    LABS REVIEWED PREVIOUS;      12 -4-23: wbc 4.3; hgb 12.9; hct 41.1; mcv 108.4 plt 108; glucose 112; bun 44; creat 16.87; k+ 4.6; na++ 139; ca 9.3 gfr 3; protein 7.0; albumin 3.5; hgb A1c 5.0; tsh 5.861 chol 89; ldl 41; trig 68; hdl 35 10-08-22: glucose 101; bun 22; creat 12.30; k+ 5.2; na++ 141; ion ca 1.14;  02-13-23: wbc 3.8; hgb 9.7; hct 28.7; mcv 101.1 plt 126; glucose 67; bun 18; creat 6.28; k+ 2.9; na++ 137; ca 8.8; gfr 9; protein 6.9; albumin 3.2 mag 1.8; occult blood: neg 02-23-23: hgb 9.8; hct 29.7 glucose  110; bun 24; creat 8.32 k+ 3.6; na++ 139; ca 9.6 gfr 6  06-11-23: wbc 4.4; hgb 11.7; hct 35.2; mcv 102.3 plt 88; glucose 160; bun 20; creat 4.71; k+ 3.5; na++ 137; ca 8.4; gfr 12   06-13-23: wbc 3.6; hgb 13.0; hct 39.2; mcv 102.6 plt 110; glucose 103; bun 11; creat  4.13; k+ 3.3; na++ 138; ca 8.6; gfr 14; d-dimer 0.54; CRP <0.5  06-16-23: glucose 99; bun 40; creat 10.88; k+ 4.6; na++ 136; ca 8.8; gfr 5  TODAY  07-16-23: hgb A1c 5.7; chol 134; ldl 39; trig 486; hdl 24   Review of Systems  Constitutional:  Negative for malaise/fatigue.  Respiratory:  Negative for cough and shortness of breath.   Cardiovascular:  Negative for chest pain, palpitations and leg swelling.  Gastrointestinal:  Negative for abdominal pain, constipation and heartburn.  Musculoskeletal:  Negative for back pain, joint pain and myalgias.  Skin: Negative.   Neurological:  Negative for dizziness.  Psychiatric/Behavioral:  The patient is not nervous/anxious.     Physical Exam Constitutional:      General: He is not in acute distress.    Appearance: He is well-developed and overweight. He is not diaphoretic.  Neck:     Thyroid: No thyromegaly.  Cardiovascular:     Rate and Rhythm: Normal rate and regular rhythm.     Pulses: Normal pulses.     Heart sounds: Normal heart sounds.  Pulmonary:     Effort: Pulmonary effort is normal. No respiratory distress.     Breath sounds: Normal breath sounds.  Abdominal:     General: Bowel sounds are normal. There is no distension.     Palpations: Abdomen is soft.     Tenderness: There is no abdominal tenderness.  Musculoskeletal:        General: Normal range of motion.     Cervical back: Neck supple.     Right lower leg: No edema.     Left lower leg: No edema.  Lymphadenopathy:     Cervical: No cervical adenopathy.  Skin:    General: Skin is warm and dry.     Comments: Left arm fistula +thrill+bruit    Neurological:     Mental Status: He is alert. Mental status is at baseline.     Comments:  02-27-23: SLUMS 2/30   Psychiatric:        Mood and Affect: Mood normal.       ASSESSMENT/ PLAN:  TODAY  Dyslipidemia associated with type 2 diabetes mellitus: trig 486; will stop lipitor and will begin crestor 10 mg daily and will  repeat labs 09-14-23.    Synthia Innocent NP [facility]  call [phone]

## 2023-07-17 LAB — HEMOGLOBIN A1C
Hgb A1c MFr Bld: 5.7 % — ABNORMAL HIGH (ref 4.8–5.6)
Mean Plasma Glucose: 117 mg/dL

## 2023-07-18 DIAGNOSIS — Z992 Dependence on renal dialysis: Secondary | ICD-10-CM | POA: Diagnosis not present

## 2023-07-18 DIAGNOSIS — D631 Anemia in chronic kidney disease: Secondary | ICD-10-CM | POA: Diagnosis not present

## 2023-07-18 DIAGNOSIS — N250 Renal osteodystrophy: Secondary | ICD-10-CM | POA: Diagnosis not present

## 2023-07-18 DIAGNOSIS — D509 Iron deficiency anemia, unspecified: Secondary | ICD-10-CM | POA: Diagnosis not present

## 2023-07-18 DIAGNOSIS — N2581 Secondary hyperparathyroidism of renal origin: Secondary | ICD-10-CM | POA: Diagnosis not present

## 2023-07-18 DIAGNOSIS — N186 End stage renal disease: Secondary | ICD-10-CM | POA: Diagnosis not present

## 2023-07-21 DIAGNOSIS — D509 Iron deficiency anemia, unspecified: Secondary | ICD-10-CM | POA: Diagnosis not present

## 2023-07-21 DIAGNOSIS — Z992 Dependence on renal dialysis: Secondary | ICD-10-CM | POA: Diagnosis not present

## 2023-07-21 DIAGNOSIS — N2581 Secondary hyperparathyroidism of renal origin: Secondary | ICD-10-CM | POA: Diagnosis not present

## 2023-07-21 DIAGNOSIS — D631 Anemia in chronic kidney disease: Secondary | ICD-10-CM | POA: Diagnosis not present

## 2023-07-21 DIAGNOSIS — N186 End stage renal disease: Secondary | ICD-10-CM | POA: Diagnosis not present

## 2023-07-21 DIAGNOSIS — N250 Renal osteodystrophy: Secondary | ICD-10-CM | POA: Diagnosis not present

## 2023-07-23 DIAGNOSIS — D509 Iron deficiency anemia, unspecified: Secondary | ICD-10-CM | POA: Diagnosis not present

## 2023-07-23 DIAGNOSIS — N186 End stage renal disease: Secondary | ICD-10-CM | POA: Diagnosis not present

## 2023-07-23 DIAGNOSIS — N2581 Secondary hyperparathyroidism of renal origin: Secondary | ICD-10-CM | POA: Diagnosis not present

## 2023-07-23 DIAGNOSIS — D631 Anemia in chronic kidney disease: Secondary | ICD-10-CM | POA: Diagnosis not present

## 2023-07-23 DIAGNOSIS — Z992 Dependence on renal dialysis: Secondary | ICD-10-CM | POA: Diagnosis not present

## 2023-07-23 DIAGNOSIS — N250 Renal osteodystrophy: Secondary | ICD-10-CM | POA: Diagnosis not present

## 2023-07-25 DIAGNOSIS — D509 Iron deficiency anemia, unspecified: Secondary | ICD-10-CM | POA: Diagnosis not present

## 2023-07-25 DIAGNOSIS — Z992 Dependence on renal dialysis: Secondary | ICD-10-CM | POA: Diagnosis not present

## 2023-07-25 DIAGNOSIS — D631 Anemia in chronic kidney disease: Secondary | ICD-10-CM | POA: Diagnosis not present

## 2023-07-25 DIAGNOSIS — N186 End stage renal disease: Secondary | ICD-10-CM | POA: Diagnosis not present

## 2023-07-25 DIAGNOSIS — N2581 Secondary hyperparathyroidism of renal origin: Secondary | ICD-10-CM | POA: Diagnosis not present

## 2023-07-25 DIAGNOSIS — N250 Renal osteodystrophy: Secondary | ICD-10-CM | POA: Diagnosis not present

## 2023-07-27 DIAGNOSIS — Z992 Dependence on renal dialysis: Secondary | ICD-10-CM | POA: Diagnosis not present

## 2023-07-27 DIAGNOSIS — N186 End stage renal disease: Secondary | ICD-10-CM | POA: Diagnosis not present

## 2023-07-28 ENCOUNTER — Encounter: Payer: Self-pay | Admitting: Internal Medicine

## 2023-07-28 ENCOUNTER — Non-Acute Institutional Stay (SKILLED_NURSING_FACILITY): Payer: Self-pay | Admitting: Internal Medicine

## 2023-07-28 DIAGNOSIS — I700 Atherosclerosis of aorta: Secondary | ICD-10-CM | POA: Diagnosis not present

## 2023-07-28 DIAGNOSIS — E1122 Type 2 diabetes mellitus with diabetic chronic kidney disease: Secondary | ICD-10-CM

## 2023-07-28 DIAGNOSIS — N186 End stage renal disease: Secondary | ICD-10-CM | POA: Diagnosis not present

## 2023-07-28 DIAGNOSIS — N2581 Secondary hyperparathyroidism of renal origin: Secondary | ICD-10-CM | POA: Diagnosis not present

## 2023-07-28 DIAGNOSIS — E781 Pure hyperglyceridemia: Secondary | ICD-10-CM | POA: Diagnosis not present

## 2023-07-28 DIAGNOSIS — E034 Atrophy of thyroid (acquired): Secondary | ICD-10-CM

## 2023-07-28 DIAGNOSIS — N250 Renal osteodystrophy: Secondary | ICD-10-CM | POA: Diagnosis not present

## 2023-07-28 DIAGNOSIS — Z992 Dependence on renal dialysis: Secondary | ICD-10-CM

## 2023-07-28 DIAGNOSIS — D509 Iron deficiency anemia, unspecified: Secondary | ICD-10-CM | POA: Diagnosis not present

## 2023-07-28 DIAGNOSIS — D631 Anemia in chronic kidney disease: Secondary | ICD-10-CM | POA: Diagnosis not present

## 2023-07-28 NOTE — Progress Notes (Unsigned)
NURSING HOME LOCATION:  [facility] ROOM NUMBER:  112 D  CODE STATUS:  Full Code  PCP:  Synthia Innocent NP  This is a nursing facility follow up visit of chronic medical diagnoses & to document compliance with Regulation 483.30 (c) in The Long Term Care Survey Manual Phase 2 which mandates caregiver visit ( visits can alternate among physician, PA or NP as per statutes) within 10 days of 30 days / 60 days/ 90 days post admission to SNF date    Interim medical record and care since last SNF visit was updated with review of diagnostic studies and change in clinical status since last visit were documented.  HPI: He is a permanent resident this facility with medical diagnoses of diabetes with end-stage renal disease on dialysis; history of DVT; GERD; dyslipidemia; essential hypertension; history of mental retardation; protein/caloric malnutrition; and history of colorectal adenocarcinoma. Labs are done @ HD 3X/ week.Lipids 9/19 revealed TG 486& LDL of 39. A1c was 5.7%.  Review of systems: Neurocognitive deficits invalidated responses.  Responses to entire review of systems were negative, usually delivered as a barely audible no or usually a high and negative response.  Speech was garbled and virtually indecipherable. Constitutional: No fever, significant weight change, fatigue  Eyes: No redness, discharge, pain, vision change ENT/mouth: No nasal congestion,  purulent discharge, earache, change in hearing, sore throat  Cardiovascular: No chest pain, palpitations, paroxysmal nocturnal dyspnea, claudication, edema  Respiratory: No cough, sputum production, hemoptysis, DOE, significant snoring, apnea   Gastrointestinal: No heartburn, dysphagia, abdominal pain, nausea /vomiting, rectal bleeding, melena, change in bowels Genitourinary: No dysuria, hematuria, pyuria, incontinence, nocturia Musculoskeletal: No joint stiffness, joint swelling, weakness, pain Dermatologic: No rash,  pruritus, change in appearance of skin Neurologic: No dizziness, headache, syncope, seizures, numbness, tingling Psychiatric: No significant anxiety, depression, insomnia, anorexia Endocrine: No change in hair/skin/nails, excessive thirst, excessive hunger, excessive urination  Hematologic/lymphatic: No significant bruising, lymphadenopathy, abnormal bleeding Allergy/immunology: No itchy/watery eyes, significant sneezing, urticaria, angioedema  Physical exam:  Pertinent or positive findings: As noted he is speech is garbled and undiscernible for the most part.  Slight ptosis is present on the right.  Sclera are muddy.  He has multiple missing teeth.  The remaining teeth are thickly black and crusted.  Breath sounds are decreased.  Abdomen is protuberant.  Pedal pulses are decreased.  Strength opposition is clinically essentially equal but decreased.  Fine tremor of the right thumb was noted intermittently. General appearance: Adequately nourished; no acute distress, increased work of breathing is present.   Lymphatic: No lymphadenopathy about the head, neck, axilla. Eyes: No conjunctival inflammation or lid edema is present. There is no scleral icterus. Ears:  External ear exam shows no significant lesions or deformities.   Nose:  External nasal examination shows no deformity or inflammation. Nasal mucosa are pink and moist without lesions, exudates Oral exam:  Lips and gums are healthy appearing. There is no oropharyngeal erythema or exudate. Neck:  No thyromegaly, masses, tenderness noted.    Heart:  Normal rate and regular rhythm. S1 and S2 normal without gallop, murmur, click, rub .  Lungs: Chest clear to auscultation without wheezes, rhonchi, rales, rubs. Abdomen: Bowel sounds are normal. Abdomen is soft and nontender with no organomegaly, hernias, masses. GU: Deferred  Extremities:  No cyanosis, clubbing, edema  Neurologic exam : Cn 2-7 intact Strength equal  in upper & lower  extremities Balance, Rhomberg, finger to nose testing could not be completed due to clinical  state Deep tendon reflexes are equal Skin: Warm & dry w/o tenting. No significant lesions or rash.  See summary under each active problem in the Problem List with associated updated therapeutic plan

## 2023-07-28 NOTE — Assessment & Plan Note (Signed)
He denies any anginal equivalent.

## 2023-07-28 NOTE — Assessment & Plan Note (Signed)
Most recent A1c was prediabetic at 5.7%.  Discordance may be expected with the ESRD.  Continue monitoring A1c every 4-6 months. Continue HD 3X / week. Med list reviewed; no change in meds or dosage at this time.

## 2023-07-28 NOTE — Assessment & Plan Note (Signed)
07/16/2023 triglycerides 488 and LDL 39.  Nutritionist to follow at [facility]. His GI issues in April may have been related to hypertriglyceridemia causing pancreatitis; but the lipase was only minimally elevated at 59 at that time.  To prevent potential recurrence of pancreatitis; the hypertriglyceridemia should be addressed.

## 2023-07-28 NOTE — Patient Instructions (Signed)
See assessment and plan under each diagnosis in the problem list and acutely for this visit 

## 2023-07-29 NOTE — Assessment & Plan Note (Signed)
He is on L-thyroxine 125 mcg/day.  The most recent TSH was 5.861 on 09/29/2022.  This should be updated as the supplement is relatively high dose.

## 2023-07-30 DIAGNOSIS — D509 Iron deficiency anemia, unspecified: Secondary | ICD-10-CM | POA: Diagnosis not present

## 2023-07-30 DIAGNOSIS — Z992 Dependence on renal dialysis: Secondary | ICD-10-CM | POA: Diagnosis not present

## 2023-07-30 DIAGNOSIS — N250 Renal osteodystrophy: Secondary | ICD-10-CM | POA: Diagnosis not present

## 2023-07-30 DIAGNOSIS — N186 End stage renal disease: Secondary | ICD-10-CM | POA: Diagnosis not present

## 2023-07-30 DIAGNOSIS — N2581 Secondary hyperparathyroidism of renal origin: Secondary | ICD-10-CM | POA: Diagnosis not present

## 2023-07-30 DIAGNOSIS — D631 Anemia in chronic kidney disease: Secondary | ICD-10-CM | POA: Diagnosis not present

## 2023-08-01 DIAGNOSIS — N186 End stage renal disease: Secondary | ICD-10-CM | POA: Diagnosis not present

## 2023-08-01 DIAGNOSIS — N250 Renal osteodystrophy: Secondary | ICD-10-CM | POA: Diagnosis not present

## 2023-08-01 DIAGNOSIS — D509 Iron deficiency anemia, unspecified: Secondary | ICD-10-CM | POA: Diagnosis not present

## 2023-08-01 DIAGNOSIS — N2581 Secondary hyperparathyroidism of renal origin: Secondary | ICD-10-CM | POA: Diagnosis not present

## 2023-08-01 DIAGNOSIS — Z992 Dependence on renal dialysis: Secondary | ICD-10-CM | POA: Diagnosis not present

## 2023-08-01 DIAGNOSIS — D631 Anemia in chronic kidney disease: Secondary | ICD-10-CM | POA: Diagnosis not present

## 2023-08-04 DIAGNOSIS — N250 Renal osteodystrophy: Secondary | ICD-10-CM | POA: Diagnosis not present

## 2023-08-04 DIAGNOSIS — L603 Nail dystrophy: Secondary | ICD-10-CM | POA: Diagnosis not present

## 2023-08-04 DIAGNOSIS — D509 Iron deficiency anemia, unspecified: Secondary | ICD-10-CM | POA: Diagnosis not present

## 2023-08-04 DIAGNOSIS — D631 Anemia in chronic kidney disease: Secondary | ICD-10-CM | POA: Diagnosis not present

## 2023-08-04 DIAGNOSIS — E1151 Type 2 diabetes mellitus with diabetic peripheral angiopathy without gangrene: Secondary | ICD-10-CM | POA: Diagnosis not present

## 2023-08-04 DIAGNOSIS — N2581 Secondary hyperparathyroidism of renal origin: Secondary | ICD-10-CM | POA: Diagnosis not present

## 2023-08-04 DIAGNOSIS — Z794 Long term (current) use of insulin: Secondary | ICD-10-CM | POA: Diagnosis not present

## 2023-08-04 DIAGNOSIS — N186 End stage renal disease: Secondary | ICD-10-CM | POA: Diagnosis not present

## 2023-08-04 DIAGNOSIS — Z992 Dependence on renal dialysis: Secondary | ICD-10-CM | POA: Diagnosis not present

## 2023-08-04 DIAGNOSIS — Z23 Encounter for immunization: Secondary | ICD-10-CM | POA: Diagnosis not present

## 2023-08-04 DIAGNOSIS — L602 Onychogryphosis: Secondary | ICD-10-CM | POA: Diagnosis not present

## 2023-08-06 DIAGNOSIS — N2581 Secondary hyperparathyroidism of renal origin: Secondary | ICD-10-CM | POA: Diagnosis not present

## 2023-08-06 DIAGNOSIS — Z992 Dependence on renal dialysis: Secondary | ICD-10-CM | POA: Diagnosis not present

## 2023-08-06 DIAGNOSIS — N250 Renal osteodystrophy: Secondary | ICD-10-CM | POA: Diagnosis not present

## 2023-08-06 DIAGNOSIS — D509 Iron deficiency anemia, unspecified: Secondary | ICD-10-CM | POA: Diagnosis not present

## 2023-08-06 DIAGNOSIS — D631 Anemia in chronic kidney disease: Secondary | ICD-10-CM | POA: Diagnosis not present

## 2023-08-06 DIAGNOSIS — N186 End stage renal disease: Secondary | ICD-10-CM | POA: Diagnosis not present

## 2023-08-08 DIAGNOSIS — N2581 Secondary hyperparathyroidism of renal origin: Secondary | ICD-10-CM | POA: Diagnosis not present

## 2023-08-08 DIAGNOSIS — D509 Iron deficiency anemia, unspecified: Secondary | ICD-10-CM | POA: Diagnosis not present

## 2023-08-08 DIAGNOSIS — N250 Renal osteodystrophy: Secondary | ICD-10-CM | POA: Diagnosis not present

## 2023-08-08 DIAGNOSIS — N186 End stage renal disease: Secondary | ICD-10-CM | POA: Diagnosis not present

## 2023-08-08 DIAGNOSIS — D631 Anemia in chronic kidney disease: Secondary | ICD-10-CM | POA: Diagnosis not present

## 2023-08-08 DIAGNOSIS — Z992 Dependence on renal dialysis: Secondary | ICD-10-CM | POA: Diagnosis not present

## 2023-08-11 DIAGNOSIS — N2581 Secondary hyperparathyroidism of renal origin: Secondary | ICD-10-CM | POA: Diagnosis not present

## 2023-08-11 DIAGNOSIS — D509 Iron deficiency anemia, unspecified: Secondary | ICD-10-CM | POA: Diagnosis not present

## 2023-08-11 DIAGNOSIS — N186 End stage renal disease: Secondary | ICD-10-CM | POA: Diagnosis not present

## 2023-08-11 DIAGNOSIS — N250 Renal osteodystrophy: Secondary | ICD-10-CM | POA: Diagnosis not present

## 2023-08-11 DIAGNOSIS — Z992 Dependence on renal dialysis: Secondary | ICD-10-CM | POA: Diagnosis not present

## 2023-08-11 DIAGNOSIS — D631 Anemia in chronic kidney disease: Secondary | ICD-10-CM | POA: Diagnosis not present

## 2023-08-13 DIAGNOSIS — Z992 Dependence on renal dialysis: Secondary | ICD-10-CM | POA: Diagnosis not present

## 2023-08-13 DIAGNOSIS — D631 Anemia in chronic kidney disease: Secondary | ICD-10-CM | POA: Diagnosis not present

## 2023-08-13 DIAGNOSIS — D509 Iron deficiency anemia, unspecified: Secondary | ICD-10-CM | POA: Diagnosis not present

## 2023-08-13 DIAGNOSIS — N2581 Secondary hyperparathyroidism of renal origin: Secondary | ICD-10-CM | POA: Diagnosis not present

## 2023-08-13 DIAGNOSIS — N186 End stage renal disease: Secondary | ICD-10-CM | POA: Diagnosis not present

## 2023-08-13 DIAGNOSIS — N250 Renal osteodystrophy: Secondary | ICD-10-CM | POA: Diagnosis not present

## 2023-08-15 DIAGNOSIS — D631 Anemia in chronic kidney disease: Secondary | ICD-10-CM | POA: Diagnosis not present

## 2023-08-15 DIAGNOSIS — N2581 Secondary hyperparathyroidism of renal origin: Secondary | ICD-10-CM | POA: Diagnosis not present

## 2023-08-15 DIAGNOSIS — Z992 Dependence on renal dialysis: Secondary | ICD-10-CM | POA: Diagnosis not present

## 2023-08-15 DIAGNOSIS — N186 End stage renal disease: Secondary | ICD-10-CM | POA: Diagnosis not present

## 2023-08-15 DIAGNOSIS — N250 Renal osteodystrophy: Secondary | ICD-10-CM | POA: Diagnosis not present

## 2023-08-15 DIAGNOSIS — D509 Iron deficiency anemia, unspecified: Secondary | ICD-10-CM | POA: Diagnosis not present

## 2023-08-17 DIAGNOSIS — Z23 Encounter for immunization: Secondary | ICD-10-CM | POA: Diagnosis not present

## 2023-08-18 DIAGNOSIS — N2581 Secondary hyperparathyroidism of renal origin: Secondary | ICD-10-CM | POA: Diagnosis not present

## 2023-08-18 DIAGNOSIS — N250 Renal osteodystrophy: Secondary | ICD-10-CM | POA: Diagnosis not present

## 2023-08-18 DIAGNOSIS — Z992 Dependence on renal dialysis: Secondary | ICD-10-CM | POA: Diagnosis not present

## 2023-08-18 DIAGNOSIS — D631 Anemia in chronic kidney disease: Secondary | ICD-10-CM | POA: Diagnosis not present

## 2023-08-18 DIAGNOSIS — D509 Iron deficiency anemia, unspecified: Secondary | ICD-10-CM | POA: Diagnosis not present

## 2023-08-18 DIAGNOSIS — N186 End stage renal disease: Secondary | ICD-10-CM | POA: Diagnosis not present

## 2023-08-20 DIAGNOSIS — Z992 Dependence on renal dialysis: Secondary | ICD-10-CM | POA: Diagnosis not present

## 2023-08-20 DIAGNOSIS — D631 Anemia in chronic kidney disease: Secondary | ICD-10-CM | POA: Diagnosis not present

## 2023-08-20 DIAGNOSIS — N250 Renal osteodystrophy: Secondary | ICD-10-CM | POA: Diagnosis not present

## 2023-08-20 DIAGNOSIS — N2581 Secondary hyperparathyroidism of renal origin: Secondary | ICD-10-CM | POA: Diagnosis not present

## 2023-08-20 DIAGNOSIS — D509 Iron deficiency anemia, unspecified: Secondary | ICD-10-CM | POA: Diagnosis not present

## 2023-08-20 DIAGNOSIS — N186 End stage renal disease: Secondary | ICD-10-CM | POA: Diagnosis not present

## 2023-08-22 DIAGNOSIS — D509 Iron deficiency anemia, unspecified: Secondary | ICD-10-CM | POA: Diagnosis not present

## 2023-08-22 DIAGNOSIS — N2581 Secondary hyperparathyroidism of renal origin: Secondary | ICD-10-CM | POA: Diagnosis not present

## 2023-08-22 DIAGNOSIS — Z992 Dependence on renal dialysis: Secondary | ICD-10-CM | POA: Diagnosis not present

## 2023-08-22 DIAGNOSIS — D631 Anemia in chronic kidney disease: Secondary | ICD-10-CM | POA: Diagnosis not present

## 2023-08-22 DIAGNOSIS — N250 Renal osteodystrophy: Secondary | ICD-10-CM | POA: Diagnosis not present

## 2023-08-22 DIAGNOSIS — N186 End stage renal disease: Secondary | ICD-10-CM | POA: Diagnosis not present

## 2023-08-25 DIAGNOSIS — D509 Iron deficiency anemia, unspecified: Secondary | ICD-10-CM | POA: Diagnosis not present

## 2023-08-25 DIAGNOSIS — D631 Anemia in chronic kidney disease: Secondary | ICD-10-CM | POA: Diagnosis not present

## 2023-08-25 DIAGNOSIS — N250 Renal osteodystrophy: Secondary | ICD-10-CM | POA: Diagnosis not present

## 2023-08-25 DIAGNOSIS — N186 End stage renal disease: Secondary | ICD-10-CM | POA: Diagnosis not present

## 2023-08-25 DIAGNOSIS — Z992 Dependence on renal dialysis: Secondary | ICD-10-CM | POA: Diagnosis not present

## 2023-08-25 DIAGNOSIS — N2581 Secondary hyperparathyroidism of renal origin: Secondary | ICD-10-CM | POA: Diagnosis not present

## 2023-08-27 DIAGNOSIS — N250 Renal osteodystrophy: Secondary | ICD-10-CM | POA: Diagnosis not present

## 2023-08-27 DIAGNOSIS — D631 Anemia in chronic kidney disease: Secondary | ICD-10-CM | POA: Diagnosis not present

## 2023-08-27 DIAGNOSIS — Z992 Dependence on renal dialysis: Secondary | ICD-10-CM | POA: Diagnosis not present

## 2023-08-27 DIAGNOSIS — D509 Iron deficiency anemia, unspecified: Secondary | ICD-10-CM | POA: Diagnosis not present

## 2023-08-27 DIAGNOSIS — N2581 Secondary hyperparathyroidism of renal origin: Secondary | ICD-10-CM | POA: Diagnosis not present

## 2023-08-27 DIAGNOSIS — N186 End stage renal disease: Secondary | ICD-10-CM | POA: Diagnosis not present

## 2023-08-29 DIAGNOSIS — D631 Anemia in chronic kidney disease: Secondary | ICD-10-CM | POA: Diagnosis not present

## 2023-08-29 DIAGNOSIS — N186 End stage renal disease: Secondary | ICD-10-CM | POA: Diagnosis not present

## 2023-08-29 DIAGNOSIS — Z992 Dependence on renal dialysis: Secondary | ICD-10-CM | POA: Diagnosis not present

## 2023-08-29 DIAGNOSIS — D509 Iron deficiency anemia, unspecified: Secondary | ICD-10-CM | POA: Diagnosis not present

## 2023-08-29 DIAGNOSIS — N2581 Secondary hyperparathyroidism of renal origin: Secondary | ICD-10-CM | POA: Diagnosis not present

## 2023-08-29 DIAGNOSIS — N250 Renal osteodystrophy: Secondary | ICD-10-CM | POA: Diagnosis not present

## 2023-09-01 ENCOUNTER — Non-Acute Institutional Stay (SKILLED_NURSING_FACILITY): Payer: Self-pay | Admitting: Adult Health

## 2023-09-01 DIAGNOSIS — N2581 Secondary hyperparathyroidism of renal origin: Secondary | ICD-10-CM | POA: Diagnosis not present

## 2023-09-01 DIAGNOSIS — R632 Polyphagia: Secondary | ICD-10-CM | POA: Diagnosis not present

## 2023-09-01 DIAGNOSIS — E034 Atrophy of thyroid (acquired): Secondary | ICD-10-CM

## 2023-09-01 DIAGNOSIS — D631 Anemia in chronic kidney disease: Secondary | ICD-10-CM | POA: Diagnosis not present

## 2023-09-01 DIAGNOSIS — Z992 Dependence on renal dialysis: Secondary | ICD-10-CM | POA: Diagnosis not present

## 2023-09-01 DIAGNOSIS — N186 End stage renal disease: Secondary | ICD-10-CM | POA: Diagnosis not present

## 2023-09-01 DIAGNOSIS — N250 Renal osteodystrophy: Secondary | ICD-10-CM | POA: Diagnosis not present

## 2023-09-01 DIAGNOSIS — D509 Iron deficiency anemia, unspecified: Secondary | ICD-10-CM | POA: Diagnosis not present

## 2023-09-02 ENCOUNTER — Encounter: Payer: Self-pay | Admitting: Adult Health

## 2023-09-02 NOTE — Progress Notes (Unsigned)
Location:  [facility] Room Number: 112 Place of Service:  SNF (31)   CODE STATUS: full   No Known Allergies  Chief Complaint  Patient presents with   Acute Visit    Weight gain     HPI:  He is gaining weight has gained 10 pounds over the past 2 months to his current weight of 213.4 pounds. He has a history of hypothyroidism. There are reports of excessive eating. There are no reports of nausea or vomiting.   Past Medical History:  Diagnosis Date   Abnormal CT scan, kidney 10/06/2011   Acute pyelonephritis 10/07/2011   Anemia    normocytic   Anxiety    mental retardation   Bladder wall thickening 10/06/2011   BPH (benign prostatic hypertrophy)    Diabetes mellitus    Dialysis patient (HCC)    Tuesday, Thursday and Saturday,    DVT of leg (deep venous thrombosis) (HCC) 12/25/2016   Edema     history of lower extremity edema   GERD (gastroesophageal reflux disease)    Heme positive stool    Hydronephrosis    Hyperkalemia    Hyperlipidemia    Hypernatremia    Hypertension    Hypothyroidism    Impaired speech    Infected prosthetic vascular graft (HCC)    MR (mental retardation)    Muscle weakness    Obstructive uropathy    Perinephric abscess 10/07/2011   Poor historian poor historian   Primary colorectal adenocarcinoma (HCC) 04/2017   S/p total colectomy   Protein calorie malnutrition (HCC)    Pyelonephritis    Renal failure (ARF), acute on chronic (HCC)    Renal insufficiency    chronic history   Sepsis (HCC)    Smoking    Uremia    Urinary retention    UTI (lower urinary tract infection) 10/06/2011    Past Surgical History:  Procedure Laterality Date   A/V FISTULAGRAM N/A 08/13/2018   Procedure: A/V FISTULAGRAM - Right Upper;  Surgeon: Sherren Kerns, MD;  Location: [facility] INVASIVE CV LAB;  Service: Cardiovascular;  Laterality: N/A;   A/V FISTULAGRAM N/A 11/22/2018   Procedure: A/V FISTULAGRAM - Right Upper;  Surgeon: Maeola Harman, MD;  Location: [facility] INVASIVE CV LAB;  Service: Cardiovascular;  Laterality: N/A;   A/V FISTULAGRAM Left 10/08/2022   Procedure: A/V Fistulagram;  Surgeon: Victorino Sparrow, MD;  Location: [facility] INVASIVE CV LAB;  Service: Cardiovascular;  Laterality: Left;   A/V FISTULAGRAM Left 05/06/2023   Procedure: A/V Fistulagram;  Surgeon: Leonie Douglas, MD;  Location: [facility] INVASIVE CV LAB;  Service: Cardiovascular;  Laterality: Left;   AV FISTULA PLACEMENT Left 07/06/2015   Procedure:  INSERTION LEFT ARM ARTERIOVENOUS GORTEX GRAFT;  Surgeon: Chuck Hint, MD;  Location: [facility] OR;  Service: Vascular;  Laterality: Left;   AV FISTULA PLACEMENT Right 02/26/2016   Procedure: ARTERIOVENOUS (AV) FISTULA CREATION ;  Surgeon: Chuck Hint, MD;  Location: [facility] OR;  Service: Vascular;  Laterality: Right;   AV FISTULA PLACEMENT Right 11/25/2018   Procedure: INSERTION OF ARTERIOVENOUS (AV) ARTEGRAFT RIGHT UPPER ARM;  Surgeon: Maeola Harman, MD;  Location: [facility] OR;  Service: Vascular;  Laterality: Right;   AV FISTULA PLACEMENT Left 05/28/2021   Procedure: LEFT ARM ARTERIOVENOUS (AV) FISTULA;  Surgeon: Larina Earthly, MD;  Location: [facility] ORS;  Service: Vascular;  Laterality: Left;   AVGG REMOVAL Left 10/09/2015   Procedure: REMOVAL OF ARTERIOVENOUS GORETEX GRAFT (AVGG) Evacuation  of Lymphocele, Vein Patch angioplasty of brachial artery.;  Surgeon: Chuck Hint, MD;  Location: [facility] OR;  Service: Vascular;  Laterality: Left;   BASCILIC VEIN TRANSPOSITION Right 02/26/2016   Procedure: Right BASCILIC VEIN TRANSPOSITION;  Surgeon: Chuck Hint, MD;  Location: [facility] OR;  Service: Vascular;  Laterality: Right;   BASCILIC VEIN TRANSPOSITION Left 07/30/2021   Procedure: LEFT ARM SECOND STAGE BASILIC VEIN TRANSPOSITION;  Surgeon: Larina Earthly, MD;  Location: [facility] ORS;  Service: Vascular;  Laterality: Left;   CIRCUMCISION N/A 01/04/2014   Procedure: CIRCUMCISION ADULT (procedure #1);   Surgeon: Ky Barban, MD;  Location: [facility] ORS;  Service: Urology;  Laterality: N/A;   COLECTOMY N/A 05/04/2017   Procedure: TOTAL COLECTOMY;  Surgeon: Franky Macho, MD;  Location: [facility] ORS;  Service: General;  Laterality: N/A;   COLONOSCOPY N/A 04/27/2017   Surgeon: Corbin Ade, MD; annular mass in the ascending colon likely representing cancer biopsied, multiple 6-22 mm polyps removed, clean rectum.  Pathology with multiple tubular adenomas, high-grade dysplasia noted in ascending colon and splenic flexure.   CYSTOSCOPY W/ RETROGRADES Bilateral 06/29/2015   Procedure: CYSTOSCOPY, DILATION OF URETHRAL STRICTURE WITH BILATERAL RETROGRADE PYELOGRAM,SUPRAPUBIC TUBE CHANGE;  Surgeon: Jerilee Field, MD;  Location: [facility] ORS;  Service: Urology;  Laterality: Bilateral;   CYSTOSCOPY WITH URETHRAL DILATATION N/A 12/29/2013   Procedure: CYSTOSCOPY WITH URETHRAL DILATATION;  Surgeon: Ky Barban, MD;  Location: [facility] ORS;  Service: Urology;  Laterality: N/A;   ESOPHAGOGASTRODUODENOSCOPY N/A 04/27/2017   Procedure: ESOPHAGOGASTRODUODENOSCOPY (EGD);  Surgeon: Corbin Ade, MD;  [facility]: [facility];  Service: Endoscopy;  Laterality: N/A;   FLEXIBLE SIGMOIDOSCOPY N/A 12/02/2021   Procedure: FLEXIBLE SIGMOIDOSCOPY;  Surgeon: Lanelle Bal, DO;  Location: [facility];  Service: Endoscopy;  Laterality: N/A;  10:30am, dialysis patient   INSERTION OF DIALYSIS CATHETER Right 11/25/2018   Procedure: INSERTION OF DIALYSIS CATHETER RIGHT INTERNAL JUGULAR;  Surgeon: Maeola Harman, MD;  Location: [facility] OR;  Service: Vascular;  Laterality: Right;   IR AV DIALY SHUNT INTRO NEEDLE/INTRACATH INITIAL W/PTA/IMG RIGHT Right 09/07/2018   IR FLUORO GUIDE CV LINE RIGHT  10/16/2020   IR FLUORO GUIDE CV LINE RIGHT  10/03/2022   IR REMOVAL TUN CV CATH W/O FL  01/12/2019   IR THROMBECTOMY AV FISTULA W/THROMBOLYSIS/PTA INC/SHUNT/IMG RIGHT Right 04/26/2018   IR US GUIDE VASC ACCESS RIGHT  04/26/2018   IR US  GUIDE VASC ACCESS RIGHT  09/07/2018   IR US GUIDE VASC ACCESS RIGHT  10/16/2020   IR US GUIDE VASC ACCESS RIGHT  10/16/2020   IR US GUIDE VASC ACCESS RIGHT  10/03/2022   ORIF FEMUR FRACTURE Right 11/22/2016   Procedure: OPEN REDUCTION INTERNAL FIXATION (ORIF) DISTAL FEMUR FRACTURE;  Surgeon: Samson Frederic, MD;  Location: [facility] OR;  Service: Orthopedics;  Laterality: Right;   PATCH ANGIOPLASTY Right 12/10/2017   Procedure: PATCH ANGIOPLASTY;  Surgeon: Chuck Hint, MD;  Location: [facility] OR;  Service: Vascular;  Laterality: Right;   PERIPHERAL VASCULAR BALLOON ANGIOPLASTY  08/13/2018   Procedure: PERIPHERAL VASCULAR BALLOON ANGIOPLASTY;  Surgeon: Sherren Kerns, MD;  Location: [facility] INVASIVE CV LAB;  Service: Cardiovascular;;  right AV fistula    PERIPHERAL VASCULAR BALLOON ANGIOPLASTY  11/22/2018   Procedure: PERIPHERAL VASCULAR BALLOON ANGIOPLASTY;  Surgeon: Maeola Harman, MD;  Location: [facility] INVASIVE CV LAB;  Service: Cardiovascular;;  rt AV fistula   PERIPHERAL VASCULAR BALLOON ANGIOPLASTY  10/08/2022   Procedure: PERIPHERAL VASCULAR BALLOON ANGIOPLASTY;  Surgeon: Victorino Sparrow,  MD;  Location: [facility] INVASIVE CV LAB;  Service: Cardiovascular;;  arterial anastamosis   PERIPHERAL VASCULAR CATHETERIZATION N/A 10/08/2015   Procedure: A/V Shuntogram;  Surgeon: Chuck Hint, MD;  Location: [facility] INVASIVE CV LAB;  Service: Cardiovascular;  Laterality: N/A;   PERIPHERAL VASCULAR INTERVENTION Left 10/08/2022   Procedure: PERIPHERAL VASCULAR INTERVENTION;  Surgeon: Victorino Sparrow, MD;  Location: [facility] INVASIVE CV LAB;  Service: Cardiovascular;  Laterality: Left;  left AVF   REMOVAL OF A DIALYSIS CATHETER N/A 12/03/2021   Procedure: MINOR REMOVAL OF A TUNNELED DIALYSIS CATHETER;  Surgeon: Larina Earthly, MD;  Location: [facility]S;  Service: Vascular;  Laterality: N/A;   REMOVAL OF A DIALYSIS CATHETER N/A 11/18/2022   Procedure: MINOR REMOVAL OF A TUNNELED DIALYSIS CATHETER;  Surgeon: Larina Earthly, MD;  Location: [facility];  Service: Vascular;  Laterality: N/A;   THROMBECTOMY W/ EMBOLECTOMY Right 12/10/2017   Procedure: THROMBECTOMY REVISION RIGHT ARM  ARTERIOVENOUS FISTULA;  Surgeon: Chuck Hint, MD;  Location: [facility] OR;  Service: Vascular;  Laterality: Right;   TRANSURETHRAL RESECTION OF PROSTATE N/A 01/04/2014   Procedure: TRANSURETHRAL RESECTION OF THE PROSTATE (TURP) (procedure #2);  Surgeon: Ky Barban, MD;  Location: [facility]S;  Service: Urology;  Laterality: N/A;    Social History   Socioeconomic History   Marital status: Single    Spouse name: Not on file   Number of children: Not on file   Years of education: Not on file   Highest education level: Not on file  Occupational History   Occupation: retired   Tobacco Use   Smoking status: Never   Smokeless tobacco: Never  Vaping Use   Vaping status: Never Used  Substance and Sexual Activity   Alcohol use: No   Drug use: No   Sexual activity: Not Currently  Other Topics Concern   Not on file  Social History Narrative   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   Long term resident of [facility]    Social Determinants of Health   Financial Resource Strain: Low Risk  (02/18/2018)   Overall Financial Resource Strain (CARDIA)    Difficulty of Paying Living Expenses: Not hard at all  Food Insecurity: No Food Insecurity (02/18/2018)   Hunger Vital Sign    Worried About Running Out of Food in the Last Year: Never true    Ran Out of Food in the Last Year: Never true  Transportation Needs: No Transportation Needs (02/18/2018)   PRAPARE - Administrator, Civil Service (Medical): No    Lack of Transportation (Non-Medical): No  Physical Activity: Unknown (09/15/2019)   Exercise Vital Sign    Days of Exercise per Week: Not on file    Minutes of Exercise per Session: 0 min  Stress: No Stress Concern Present (02/18/2018)   [employer] of Occupational Health -  Occupational Stress Questionnaire    Feeling of Stress : Not at all  Social Connections: Unknown (09/15/2019)   Social Connection and Isolation Panel [NHANES]    Frequency of Communication with Friends and Family: Not on file    Frequency of Social Gatherings with Friends and Family: Never    Attends Religious Services: Not on file    Active Member of Clubs or Organizations: Not on file    Attends Banker Meetings: Not on file    Marital Status: Not on file  Intimate Partner Violence: Not At Risk (02/18/2018)   Humiliation, Afraid, Rape, and Kick questionnaire  Fear of Current or Ex-Partner: No    Emotionally Abused: No    Physically Abused: No    Sexually Abused: No   Family History  Problem Relation Age of Onset   Cancer Mother    Colon cancer Neg Hx       VITAL SIGNS BP 131/71   Pulse 72   Temp 97.9 F (36.6 C)   Resp 19   Ht 5\' 8"  (1.727 m)   Wt 213 lb 6.4 oz (96.8 kg)   SpO2 99%   BMI 32.45 kg/m   Outpatient Encounter Medications as of 09/01/2023  Medication Sig   aspirin EC 81 MG tablet Take 81 mg by mouth daily. 0900   Insulin Glargine (BASAGLAR KWIKPEN) 100 UNIT/ML Inject 8 Units into the skin at bedtime. 2200   Insulin Pen Needle (BD AUTOSHIELD DUO) 30G X 5 MM MISC by Does not apply route. 3/16"   levothyroxine (SYNTHROID) 125 MCG tablet Take 125 mcg by mouth at bedtime. 10 am, Special Instructions: Must be given (1) hour apart from food or other medications per pharmacy recommendations   midodrine (PROAMATINE) 10 MG tablet Take 10 mg by mouth See admin instructions. Send med with resident on  Tues/Thurs/Sat to dialysis. do not give at [facility], give to resident to take with him. send with 5mg  to equal 15mg . 6:00   midodrine (PROAMATINE) 5 MG tablet Take 5 mg by mouth See admin instructions. Send with resident on dialysis days.  Tues/Thurs/Sat  Take along with 10 mg to equal 15 mg 6:00   NON FORMULARY Diet: Regular diet   Nutritional Supplements  (,FEEDING SUPPLEMENT, PROSOURCE PLUS) liquid Take 30 mLs by mouth 2 (two) times daily between meals. 1600 and 2200   omega-3 acid ethyl esters (LOVAZA) 1 g capsule Take 2 g by mouth at bedtime. 2200   omeprazole (PRILOSEC) 40 MG capsule Take 40 mg by mouth at bedtime. 2200   polyethylene glycol (MIRALAX / GLYCOLAX) packet Take 17 g by mouth daily as needed for mild constipation.    rosuvastatin (CRESTOR) 10 MG tablet Take 10 mg by mouth at bedtime.   sevelamer carbonate (RENVELA) 800 MG tablet Take 3 tablets (2,400 mg total) by mouth 3 (three) times daily with meals.   tamsulosin (FLOMAX) 0.4 MG CAPS capsule Take 0.4 mg by mouth at bedtime. Give 30 minutes after a meal. Do not crush or chew   torsemide (DEMADEX) 20 MG tablet Take 20 mg by mouth See admin instructions. 20mg  daily at 2000 on Sunday, Monday, Wednesday, and Friday only.   vitamin B-12 (CYANOCOBALAMIN) 1000 MCG tablet Take 1,000 mcg by mouth at bedtime.   No facility-administered encounter medications on file as of 09/01/2023.     SIGNIFICANT DIAGNOSTIC EXAMS   PREVIOUS   06-11-22: dexa: t score: -2.052  NO NEW EXAMS.    LABS REVIEWED PREVIOUS;      12 -4-23: wbc 4.3; hgb 12.9; hct 41.1; mcv 108.4 plt 108; glucose 112; bun 44; creat 16.87; k+ 4.6; na++ 139; ca 9.3 gfr 3; protein 7.0; albumin 3.5; hgb A1c 5.0; tsh 5.861 chol 89; ldl 41; trig 68; hdl 35 10-08-22: glucose 101; bun 22; creat 12.30; k+ 5.2; na++ 141; ion ca 1.14;  02-13-23: wbc 3.8; hgb 9.7; hct 28.7; mcv 101.1 plt 126; glucose 67; bun 18; creat 6.28; k+ 2.9; na++ 137; ca 8.8; gfr 9; protein 6.9; albumin 3.2 mag 1.8; occult blood: neg 02-23-23: hgb 9.8; hct 29.7 glucose 110; bun 24; creat 8.32 k+ 3.6;  na++ 139; ca 9.6 gfr 6  06-11-23: wbc 4.4; hgb 11.7; hct 35.2; mcv 102.3 plt 88; glucose 160; bun 20; creat 4.71; k+ 3.5; na++ 137; ca 8.4; gfr 12   06-13-23: wbc 3.6; hgb 13.0; hct 39.2; mcv 102.6 plt 110; glucose 103; bun 11; creat 4.13; k+ 3.3; na++ 138; ca 8.6; gfr  14; d-dimer 0.54; CRP <0.5  06-16-23: glucose 99; bun 40; creat 10.88; k+ 4.6; na++ 136; ca 8.8; gfr 5 07-16-23: hgb A1c 5.7; chol 134; ldl 39; trig 486; hdl 24  NO NEW LABS.    Review of Systems  Constitutional:  Negative for malaise/fatigue.  Respiratory:  Negative for cough and shortness of breath.   Cardiovascular:  Negative for chest pain, palpitations and leg swelling.  Gastrointestinal:  Negative for abdominal pain, constipation and heartburn.  Musculoskeletal:  Negative for back pain, joint pain and myalgias.  Skin: Negative.   Neurological:  Negative for dizziness.  Psychiatric/Behavioral:  The patient is not nervous/anxious.     Physical Exam Constitutional:      General: He is not in acute distress.    Appearance: He is well-developed. He is obese. He is not diaphoretic.  Neck:     Thyroid: No thyromegaly.  Cardiovascular:     Rate and Rhythm: Normal rate and regular rhythm.     Pulses: Normal pulses.     Heart sounds: Normal heart sounds.  Pulmonary:     Effort: Pulmonary effort is normal. No respiratory distress.     Breath sounds: Normal breath sounds.  Abdominal:     General: Bowel sounds are normal. There is no distension.     Palpations: Abdomen is soft.     Tenderness: There is no abdominal tenderness.  Musculoskeletal:        General: Normal range of motion.     Cervical back: Neck supple.     Right lower leg: No edema.     Left lower leg: No edema.  Lymphadenopathy:     Cervical: No cervical adenopathy.  Skin:    General: Skin is warm and dry.     Comments: Left arm fistula +thrill+bruit     Neurological:     Mental Status: He is alert. Mental status is at baseline.     Comments:  02-27-23: SLUMS 2/30    Psychiatric:        Mood and Affect: Mood normal.      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 He continues with hemodialysis three days  weekly.    Synthia Innocent NP [facility]   call [phone]

## 2023-09-03 ENCOUNTER — Other Ambulatory Visit (HOSPITAL_COMMUNITY)
Admission: RE | Admit: 2023-09-03 | Discharge: 2023-09-03 | Disposition: A | Discharge status: Home / Self Care | Payer: Medicare Other | Source: Skilled Nursing Facility | Attending: Adult Health | Admitting: Adult Health

## 2023-09-03 DIAGNOSIS — N2581 Secondary hyperparathyroidism of renal origin: Secondary | ICD-10-CM | POA: Diagnosis not present

## 2023-09-03 DIAGNOSIS — E039 Hypothyroidism, unspecified: Secondary | ICD-10-CM | POA: Diagnosis not present

## 2023-09-03 DIAGNOSIS — R632 Polyphagia: Secondary | ICD-10-CM | POA: Insufficient documentation

## 2023-09-03 DIAGNOSIS — N186 End stage renal disease: Secondary | ICD-10-CM | POA: Diagnosis not present

## 2023-09-03 DIAGNOSIS — D509 Iron deficiency anemia, unspecified: Secondary | ICD-10-CM | POA: Diagnosis not present

## 2023-09-03 DIAGNOSIS — N250 Renal osteodystrophy: Secondary | ICD-10-CM | POA: Diagnosis not present

## 2023-09-03 DIAGNOSIS — Z992 Dependence on renal dialysis: Secondary | ICD-10-CM | POA: Diagnosis not present

## 2023-09-03 DIAGNOSIS — D631 Anemia in chronic kidney disease: Secondary | ICD-10-CM | POA: Diagnosis not present

## 2023-09-03 LAB — T4, FREE: Free T4: 0.82 ng/dL (ref 0.61–1.12)

## 2023-09-03 LAB — TSH: TSH: 3.852 u[IU]/mL (ref 0.350–4.500)

## 2023-09-05 DIAGNOSIS — N2581 Secondary hyperparathyroidism of renal origin: Secondary | ICD-10-CM | POA: Diagnosis not present

## 2023-09-05 DIAGNOSIS — Z992 Dependence on renal dialysis: Secondary | ICD-10-CM | POA: Diagnosis not present

## 2023-09-05 DIAGNOSIS — D509 Iron deficiency anemia, unspecified: Secondary | ICD-10-CM | POA: Diagnosis not present

## 2023-09-05 DIAGNOSIS — N250 Renal osteodystrophy: Secondary | ICD-10-CM | POA: Diagnosis not present

## 2023-09-05 DIAGNOSIS — N186 End stage renal disease: Secondary | ICD-10-CM | POA: Diagnosis not present

## 2023-09-05 DIAGNOSIS — D631 Anemia in chronic kidney disease: Secondary | ICD-10-CM | POA: Diagnosis not present

## 2023-09-08 DIAGNOSIS — N186 End stage renal disease: Secondary | ICD-10-CM | POA: Diagnosis not present

## 2023-09-08 DIAGNOSIS — N250 Renal osteodystrophy: Secondary | ICD-10-CM | POA: Diagnosis not present

## 2023-09-08 DIAGNOSIS — N2581 Secondary hyperparathyroidism of renal origin: Secondary | ICD-10-CM | POA: Diagnosis not present

## 2023-09-08 DIAGNOSIS — Z992 Dependence on renal dialysis: Secondary | ICD-10-CM | POA: Diagnosis not present

## 2023-09-08 DIAGNOSIS — D509 Iron deficiency anemia, unspecified: Secondary | ICD-10-CM | POA: Diagnosis not present

## 2023-09-08 DIAGNOSIS — D631 Anemia in chronic kidney disease: Secondary | ICD-10-CM | POA: Diagnosis not present

## 2023-09-10 DIAGNOSIS — Z992 Dependence on renal dialysis: Secondary | ICD-10-CM | POA: Diagnosis not present

## 2023-09-10 DIAGNOSIS — D631 Anemia in chronic kidney disease: Secondary | ICD-10-CM | POA: Diagnosis not present

## 2023-09-10 DIAGNOSIS — D509 Iron deficiency anemia, unspecified: Secondary | ICD-10-CM | POA: Diagnosis not present

## 2023-09-10 DIAGNOSIS — N2581 Secondary hyperparathyroidism of renal origin: Secondary | ICD-10-CM | POA: Diagnosis not present

## 2023-09-10 DIAGNOSIS — N186 End stage renal disease: Secondary | ICD-10-CM | POA: Diagnosis not present

## 2023-09-10 DIAGNOSIS — N250 Renal osteodystrophy: Secondary | ICD-10-CM | POA: Diagnosis not present

## 2023-09-11 ENCOUNTER — Encounter: Payer: Self-pay | Admitting: Adult Health

## 2023-09-11 ENCOUNTER — Non-Acute Institutional Stay (SKILLED_NURSING_FACILITY): Payer: Medicare Other | Admitting: Adult Health

## 2023-09-11 DIAGNOSIS — Z992 Dependence on renal dialysis: Secondary | ICD-10-CM

## 2023-09-11 DIAGNOSIS — I700 Atherosclerosis of aorta: Secondary | ICD-10-CM | POA: Diagnosis not present

## 2023-09-11 DIAGNOSIS — D696 Thrombocytopenia, unspecified: Secondary | ICD-10-CM

## 2023-09-11 NOTE — Progress Notes (Signed)
Location:  [facility] Room Number: 112 Place of Service:  SNF (31)   CODE STATUS: full   No Known Allergies  Chief Complaint  Patient presents with   Acute Visit    Care plan meeting.     HPI:  We have come together for his care plan meeting. Family present BIMS 8/15 mood 0/30. He uses wheelchair with no falls. He is independent to supervision with his adl care. He is occasionally incontinent of bladder and bowel. Dietary: weight is 213.4 pounds regular diet feeds self; appetite 76-100%. Therapy: none at this time. Activities: TV socializing, outdoors. He will continue to be followed for his chronic illnesses including:   Aortic atherosclerosis   Thrombocytopenia  Dependence on renal dialysis  Past Medical History:  Diagnosis Date   Abnormal CT scan, kidney 10/06/2011   Acute pyelonephritis 10/07/2011   Anemia    normocytic   Anxiety    mental retardation   Bladder wall thickening 10/06/2011   BPH (benign prostatic hypertrophy)    Diabetes mellitus    Dialysis patient (HCC)    Tuesday, Thursday and Saturday,    DVT of leg (deep venous thrombosis) (HCC) 12/25/2016   Edema     history of lower extremity edema   GERD (gastroesophageal reflux disease)    Heme positive stool    Hydronephrosis    Hyperkalemia    Hyperlipidemia    Hypernatremia    Hypertension    Hypothyroidism    Impaired speech    Infected prosthetic vascular graft (HCC)    MR (mental retardation)    Muscle weakness    Obstructive uropathy    Perinephric abscess 10/07/2011   Poor historian poor historian   Primary colorectal adenocarcinoma (HCC) 04/2017   S/p total colectomy   Protein calorie malnutrition (HCC)    Pyelonephritis    Renal failure (ARF), acute on chronic (HCC)    Renal insufficiency    chronic history   Sepsis (HCC)    Smoking    Uremia    Urinary retention    UTI (lower urinary tract infection) 10/06/2011    Past Surgical History:  Procedure  Laterality Date   A/V FISTULAGRAM N/A 08/13/2018   Procedure: A/V FISTULAGRAM - Right Upper;  Surgeon: Sherren Kerns, MD;  Location: [facility] INVASIVE CV LAB;  Service: Cardiovascular;  Laterality: N/A;   A/V FISTULAGRAM N/A 11/22/2018   Procedure: A/V FISTULAGRAM - Right Upper;  Surgeon: Maeola Harman, MD;  Location: [facility] INVASIVE CV LAB;  Service: Cardiovascular;  Laterality: N/A;   A/V FISTULAGRAM Left 10/08/2022   Procedure: A/V Fistulagram;  Surgeon: Victorino Sparrow, MD;  Location: [facility] INVASIVE CV LAB;  Service: Cardiovascular;  Laterality: Left;   A/V FISTULAGRAM Left 05/06/2023   Procedure: A/V Fistulagram;  Surgeon: Leonie Douglas, MD;  Location: [facility] INVASIVE CV LAB;  Service: Cardiovascular;  Laterality: Left;   AV FISTULA PLACEMENT Left 07/06/2015   Procedure:  INSERTION LEFT ARM ARTERIOVENOUS GORTEX GRAFT;  Surgeon: Chuck Hint, MD;  Location: [facility] OR;  Service: Vascular;  Laterality: Left;   AV FISTULA PLACEMENT Right 02/26/2016   Procedure: ARTERIOVENOUS (AV) FISTULA CREATION ;  Surgeon: Chuck Hint, MD;  Location: [facility] OR;  Service: Vascular;  Laterality: Right;   AV FISTULA PLACEMENT Right 11/25/2018   Procedure: INSERTION OF ARTERIOVENOUS (AV) ARTEGRAFT RIGHT UPPER ARM;  Surgeon: Maeola Harman, MD;  Location: [facility] OR;  Service: Vascular;  Laterality: Right;   AV FISTULA PLACEMENT Left  05/28/2021   Procedure: LEFT ARM ARTERIOVENOUS (AV) FISTULA;  Surgeon: Larina Earthly, MD;  Location: [facility] ORS;  Service: Vascular;  Laterality: Left;   AVGG REMOVAL Left 10/09/2015   Procedure: REMOVAL OF ARTERIOVENOUS GORETEX GRAFT (AVGG) Evacuation of Lymphocele, Vein Patch angioplasty of brachial artery.;  Surgeon: Chuck Hint, MD;  Location: [facility] OR;  Service: Vascular;  Laterality: Left;   BASCILIC VEIN TRANSPOSITION Right 02/26/2016   Procedure: Right BASCILIC VEIN TRANSPOSITION;  Surgeon: Chuck Hint, MD;  Location: [facility] OR;  Service: Vascular;   Laterality: Right;   BASCILIC VEIN TRANSPOSITION Left 07/30/2021   Procedure: LEFT ARM SECOND STAGE BASILIC VEIN TRANSPOSITION;  Surgeon: Larina Earthly, MD;  Location: [facility] ORS;  Service: Vascular;  Laterality: Left;   CIRCUMCISION N/A 01/04/2014   Procedure: CIRCUMCISION ADULT (procedure #1);  Surgeon: Ky Barban, MD;  Location: [facility] ORS;  Service: Urology;  Laterality: N/A;   COLECTOMY N/A 05/04/2017   Procedure: TOTAL COLECTOMY;  Surgeon: Franky Macho, MD;  Location: [facility] ORS;  Service: General;  Laterality: N/A;   COLONOSCOPY N/A 04/27/2017   Surgeon: Corbin Ade, MD; annular mass in the ascending colon likely representing cancer biopsied, multiple 6-22 mm polyps removed, clean rectum.  Pathology with multiple tubular adenomas, high-grade dysplasia noted in ascending colon and splenic flexure.   CYSTOSCOPY W/ RETROGRADES Bilateral 06/29/2015   Procedure: CYSTOSCOPY, DILATION OF URETHRAL STRICTURE WITH BILATERAL RETROGRADE PYELOGRAM,SUPRAPUBIC TUBE CHANGE;  Surgeon: Jerilee Field, MD;  Location: [facility] ORS;  Service: Urology;  Laterality: Bilateral;   CYSTOSCOPY WITH URETHRAL DILATATION N/A 12/29/2013   Procedure: CYSTOSCOPY WITH URETHRAL DILATATION;  Surgeon: Ky Barban, MD;  Location: [facility] ORS;  Service: Urology;  Laterality: N/A;   ESOPHAGOGASTRODUODENOSCOPY N/A 04/27/2017   Procedure: ESOPHAGOGASTRODUODENOSCOPY (EGD);  Surgeon: Corbin Ade, MD;  [facility]: [facility];  Service: Endoscopy;  Laterality: N/A;   FLEXIBLE SIGMOIDOSCOPY N/A 12/02/2021   Procedure: FLEXIBLE SIGMOIDOSCOPY;  Surgeon: Lanelle Bal, DO;  Location: [facility];  Service: Endoscopy;  Laterality: N/A;  10:30am, dialysis patient   INSERTION OF DIALYSIS CATHETER Right 11/25/2018   Procedure: INSERTION OF DIALYSIS CATHETER RIGHT INTERNAL JUGULAR;  Surgeon: Maeola Harman, MD;  Location: [facility] OR;  Service: Vascular;  Laterality: Right;   IR AV DIALY SHUNT INTRO NEEDLE/INTRACATH INITIAL W/PTA/IMG  RIGHT Right 09/07/2018   IR FLUORO GUIDE CV LINE RIGHT  10/16/2020   IR FLUORO GUIDE CV LINE RIGHT  10/03/2022   IR REMOVAL TUN CV CATH W/O FL  01/12/2019   IR THROMBECTOMY AV FISTULA W/THROMBOLYSIS/PTA INC/SHUNT/IMG RIGHT Right 04/26/2018   IR US GUIDE VASC ACCESS RIGHT  04/26/2018   IR US GUIDE VASC ACCESS RIGHT  09/07/2018   IR US GUIDE VASC ACCESS RIGHT  10/16/2020   IR US GUIDE VASC ACCESS RIGHT  10/16/2020   IR US GUIDE VASC ACCESS RIGHT  10/03/2022   ORIF FEMUR FRACTURE Right 11/22/2016   Procedure: OPEN REDUCTION INTERNAL FIXATION (ORIF) DISTAL FEMUR FRACTURE;  Surgeon: Samson Frederic, MD;  Location: [facility] OR;  Service: Orthopedics;  Laterality: Right;   PATCH ANGIOPLASTY Right 12/10/2017   Procedure: PATCH ANGIOPLASTY;  Surgeon: Chuck Hint, MD;  Location: [facility] OR;  Service: Vascular;  Laterality: Right;   PERIPHERAL VASCULAR BALLOON ANGIOPLASTY  08/13/2018   Procedure: PERIPHERAL VASCULAR BALLOON ANGIOPLASTY;  Surgeon: Sherren Kerns, MD;  Location: [facility] INVASIVE CV LAB;  Service: Cardiovascular;;  right AV fistula    PERIPHERAL VASCULAR BALLOON ANGIOPLASTY  11/22/2018   Procedure: PERIPHERAL VASCULAR  BALLOON ANGIOPLASTY;  Surgeon: Maeola Harman, MD;  Location: [facility] INVASIVE CV LAB;  Service: Cardiovascular;;  rt AV fistula   PERIPHERAL VASCULAR BALLOON ANGIOPLASTY  10/08/2022   Procedure: PERIPHERAL VASCULAR BALLOON ANGIOPLASTY;  Surgeon: Victorino Sparrow, MD;  Location: [facility] INVASIVE CV LAB;  Service: Cardiovascular;;  arterial anastamosis   PERIPHERAL VASCULAR CATHETERIZATION N/A 10/08/2015   Procedure: A/V Shuntogram;  Surgeon: Chuck Hint, MD;  Location: [facility] INVASIVE CV LAB;  Service: Cardiovascular;  Laterality: N/A;   PERIPHERAL VASCULAR INTERVENTION Left 10/08/2022   Procedure: PERIPHERAL VASCULAR INTERVENTION;  Surgeon: Victorino Sparrow, MD;  Location: [facility] INVASIVE CV LAB;  Service: Cardiovascular;  Laterality: Left;  left AVF   REMOVAL OF A DIALYSIS  CATHETER N/A 12/03/2021   Procedure: MINOR REMOVAL OF A TUNNELED DIALYSIS CATHETER;  Surgeon: Larina Earthly, MD;  Location: [facility];  Service: Vascular;  Laterality: N/A;   REMOVAL OF A DIALYSIS CATHETER N/A 11/18/2022   Procedure: MINOR REMOVAL OF A TUNNELED DIALYSIS CATHETER;  Surgeon: Larina Earthly, MD;  Location: [facility];  Service: Vascular;  Laterality: N/A;   THROMBECTOMY W/ EMBOLECTOMY Right 12/10/2017   Procedure: THROMBECTOMY REVISION RIGHT ARM  ARTERIOVENOUS FISTULA;  Surgeon: Chuck Hint, MD;  Location: [facility] OR;  Service: Vascular;  Laterality: Right;   TRANSURETHRAL RESECTION OF PROSTATE N/A 01/04/2014   Procedure: TRANSURETHRAL RESECTION OF THE PROSTATE (TURP) (procedure #2);  Surgeon: Ky Barban, MD;  Location: [facility] ORS;  Service: Urology;  Laterality: N/A;    Social History   Socioeconomic History   Marital status: Single    Spouse name: Not on file   Number of children: Not on file   Years of education: Not on file   Highest education level: Not on file  Occupational History   Occupation: retired   Tobacco Use   Smoking status: Never   Smokeless tobacco: Never  Vaping Use   Vaping status: Never Used  Substance and Sexual Activity   Alcohol use: No   Drug use: No   Sexual activity: Not Currently  Other Topics Concern   Not on file  Social History Narrative   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   Long term resident of [facility]    Social Determinants of Health   Financial Resource Strain: Low Risk  (02/18/2018)   Overall Financial Resource Strain (CARDIA)    Difficulty of Paying Living Expenses: Not hard at all  Food Insecurity: No Food Insecurity (02/18/2018)   Hunger Vital Sign    Worried About Running Out of Food in the Last Year: Never true    Ran Out of Food in the Last Year: Never true  Transportation Needs: No Transportation Needs (02/18/2018)   PRAPARE - Administrator, Civil Service (Medical): No     Lack of Transportation (Non-Medical): No  Physical Activity: Unknown (09/15/2019)   Exercise Vital Sign    Days of Exercise per Week: Not on file    Minutes of Exercise per Session: 0 min  Stress: No Stress Concern Present (02/18/2018)   [employer] of Occupational Health - Occupational Stress Questionnaire    Feeling of Stress : Not at all  Social Connections: Unknown (09/15/2019)   Social Connection and Isolation Panel [NHANES]    Frequency of Communication with Friends and Family: Not on file    Frequency of Social Gatherings with Friends and Family: Never    Attends Religious Services: Not on file    Active Member of  Clubs or Organizations: Not on file    Attends Club or Organization Meetings: Not on file    Marital Status: Not on file  Intimate Partner Violence: Not At Risk (02/18/2018)   Humiliation, Afraid, Rape, and Kick questionnaire    Fear of Current or Ex-Partner: No    Emotionally Abused: No    Physically Abused: No    Sexually Abused: No   Family History  Problem Relation Age of Onset   Cancer Mother    Colon cancer Neg Hx       VITAL SIGNS BP 118/74   Pulse 68   Temp (!) 97.2 F (36.2 C)   Resp (!) 22   Ht 5\' 8"  (1.727 m)   Wt 213 lb 6.4 oz (96.8 kg)   SpO2 98%   BMI 32.45 kg/m   Outpatient Encounter Medications as of 09/11/2023  Medication Sig   aspirin EC 81 MG tablet Take 81 mg by mouth daily. 0900   Insulin Glargine (BASAGLAR KWIKPEN) 100 UNIT/ML Inject 8 Units into the skin at bedtime. 2200   Insulin Pen Needle (BD AUTOSHIELD DUO) 30G X 5 MM MISC by Does not apply route. 3/16"   levothyroxine (SYNTHROID) 125 MCG tablet Take 125 mcg by mouth at bedtime. 10 am, Special Instructions: Must be given (1) hour apart from food or other medications per pharmacy recommendations   midodrine (PROAMATINE) 10 MG tablet Take 10 mg by mouth See admin instructions. Send med with resident on  Tues/Thurs/Sat to dialysis. do not give at [facility], give to  resident to take with him. send with 5mg  to equal 15mg . 6:00   midodrine (PROAMATINE) 5 MG tablet Take 5 mg by mouth See admin instructions. Send with resident on dialysis days.  Tues/Thurs/Sat  Take along with 10 mg to equal 15 mg 6:00   NON FORMULARY Diet: Regular diet   Nutritional Supplements (,FEEDING SUPPLEMENT, PROSOURCE PLUS) liquid Take 30 mLs by mouth 2 (two) times daily between meals. 1600 and 2200   omega-3 acid ethyl esters (LOVAZA) 1 g capsule Take 2 g by mouth at bedtime. 2200   omeprazole (PRILOSEC) 40 MG capsule Take 40 mg by mouth at bedtime. 2200   polyethylene glycol (MIRALAX / GLYCOLAX) packet Take 17 g by mouth daily as needed for mild constipation.    rosuvastatin (CRESTOR) 10 MG tablet Take 10 mg by mouth at bedtime.   sevelamer carbonate (RENVELA) 800 MG tablet Take 3 tablets (2,400 mg total) by mouth 3 (three) times daily with meals.   tamsulosin (FLOMAX) 0.4 MG CAPS capsule Take 0.4 mg by mouth at bedtime. Give 30 minutes after a meal. Do not crush or chew   torsemide (DEMADEX) 20 MG tablet Take 20 mg by mouth See admin instructions. 20mg  daily at 2000 on Sunday, Monday, Wednesday, and Friday only.   vitamin B-12 (CYANOCOBALAMIN) 1000 MCG tablet Take 1,000 mcg by mouth at bedtime.   No facility-administered encounter medications on file as of 09/11/2023.     SIGNIFICANT DIAGNOSTIC EXAMS  PREVIOUS   06-11-22: dexa: t score: -2.052  NO NEW EXAMS.   LABS REVIEWED PREVIOUS;      12 -4-23: wbc 4.3; hgb 12.9; hct 41.1; mcv 108.4 plt 108; glucose 112; bun 44; creat 16.87; k+ 4.6; na++ 139; ca 9.3 gfr 3; protein 7.0; albumin 3.5; hgb A1c 5.0; tsh 5.861 chol 89; ldl 41; trig 68; hdl 35 10-08-22: glucose 101; bun 22; creat 12.30; k+ 5.2; na++ 141; ion ca 1.14;  02-13-23: wbc 3.8;  hgb 9.7; hct 28.7; mcv 101.1 plt 126; glucose 67; bun 18; creat 6.28; k+ 2.9; na++ 137; ca 8.8; gfr 9; protein 6.9; albumin 3.2 mag 1.8; occult blood: neg 02-23-23: hgb 9.8; hct 29.7 glucose 110;  bun 24; creat 8.32 k+ 3.6; na++ 139; ca 9.6 gfr 6  06-11-23: wbc 4.4; hgb 11.7; hct 35.2; mcv 102.3 plt 88; glucose 160; bun 20; creat 4.71; k+ 3.5; na++ 137; ca 8.4; gfr 12   06-13-23: wbc 3.6; hgb 13.0; hct 39.2; mcv 102.6 plt 110; glucose 103; bun 11; creat 4.13; k+ 3.3; na++ 138; ca 8.6; gfr 14; d-dimer 0.54; CRP <0.5  06-16-23: glucose 99; bun 40; creat 10.88; k+ 4.6; na++ 136; ca 8.8; gfr 5 07-16-23: hgb A1c 5.7; chol 134; ldl 39; trig 486; hdl 24  NO NEW LABS.    Review of Systems  Constitutional:  Negative for malaise/fatigue.  Respiratory:  Negative for cough and shortness of breath.   Cardiovascular:  Negative for chest pain, palpitations and leg swelling.  Gastrointestinal:  Negative for abdominal pain, constipation and heartburn.  Musculoskeletal:  Negative for back pain, joint pain and myalgias.  Skin: Negative.   Neurological:  Negative for dizziness.  Psychiatric/Behavioral:  The patient is not nervous/anxious.    Physical Exam Constitutional:      General: He is not in acute distress.    Appearance: He is well-developed. He is obese. He is not diaphoretic.  Neck:     Thyroid: No thyromegaly.  Cardiovascular:     Rate and Rhythm: Normal rate and regular rhythm.     Pulses: Normal pulses.     Heart sounds: Normal heart sounds.  Pulmonary:     Effort: Pulmonary effort is normal. No respiratory distress.     Breath sounds: Normal breath sounds.  Abdominal:     General: Bowel sounds are normal. There is no distension.     Palpations: Abdomen is soft.     Tenderness: There is no abdominal tenderness.  Musculoskeletal:        General: Normal range of motion.     Cervical back: Neck supple.  Lymphadenopathy:     Cervical: No cervical adenopathy.  Skin:    General: Skin is warm and dry.     Comments: Left arm fistula +thrill+bruit    Neurological:     Mental Status: He is alert. Mental status is at baseline.     Comments: 02-27-23: SLUMS 2/30     Psychiatric:         Mood and Affect: Mood normal.      ASSESSMENT/ PLAN:  TODAY  Aortic atherosclerosis Thrombocytopenia Dependence on renal dialysis  Will continue current medications Will continue current plan of care Will continue to monitor his status   Time spent with patient: 40 minutes: medications; dietary; activities.    Synthia Innocent NP [facility]  call [phone]

## 2023-09-12 DIAGNOSIS — N250 Renal osteodystrophy: Secondary | ICD-10-CM | POA: Diagnosis not present

## 2023-09-12 DIAGNOSIS — N2581 Secondary hyperparathyroidism of renal origin: Secondary | ICD-10-CM | POA: Diagnosis not present

## 2023-09-12 DIAGNOSIS — D509 Iron deficiency anemia, unspecified: Secondary | ICD-10-CM | POA: Diagnosis not present

## 2023-09-12 DIAGNOSIS — N186 End stage renal disease: Secondary | ICD-10-CM | POA: Diagnosis not present

## 2023-09-12 DIAGNOSIS — D631 Anemia in chronic kidney disease: Secondary | ICD-10-CM | POA: Diagnosis not present

## 2023-09-12 DIAGNOSIS — Z992 Dependence on renal dialysis: Secondary | ICD-10-CM | POA: Diagnosis not present

## 2023-09-14 ENCOUNTER — Other Ambulatory Visit (HOSPITAL_COMMUNITY)
Admission: RE | Admit: 2023-09-14 | Discharge: 2023-09-14 | Disposition: A | Discharge status: Home / Self Care | Payer: Medicare Other | Source: Skilled Nursing Facility | Attending: Adult Health | Admitting: Adult Health

## 2023-09-14 DIAGNOSIS — E785 Hyperlipidemia, unspecified: Secondary | ICD-10-CM | POA: Diagnosis not present

## 2023-09-14 DIAGNOSIS — E1169 Type 2 diabetes mellitus with other specified complication: Secondary | ICD-10-CM | POA: Insufficient documentation

## 2023-09-14 LAB — HEPATIC FUNCTION PANEL
ALT: 30 U/L (ref 0–44)
AST: 22 U/L (ref 15–41)
Albumin: 3.2 g/dL — ABNORMAL LOW (ref 3.5–5.0)
Alkaline Phosphatase: 75 U/L (ref 38–126)
Bilirubin, Direct: 0.1 mg/dL (ref 0.0–0.2)
Indirect Bilirubin: 0.4 mg/dL (ref 0.3–0.9)
Total Bilirubin: 0.5 mg/dL (ref <1.2)
Total Protein: 6.9 g/dL (ref 6.5–8.1)

## 2023-09-14 LAB — LIPID PANEL
Cholesterol: 127 mg/dL (ref 0–200)
HDL: 25 mg/dL — ABNORMAL LOW (ref >40)
LDL Cholesterol: UNDETERMINED mg/dL (ref 0–99)
Total CHOL/HDL Ratio: 5.1 {ratio}
Triglycerides: 420 mg/dL — ABNORMAL HIGH (ref <150)
VLDL: UNDETERMINED mg/dL (ref 0–40)

## 2023-09-14 LAB — LDL CHOLESTEROL, DIRECT: Direct LDL: 37 mg/dL (ref 0–99)

## 2023-09-15 ENCOUNTER — Non-Acute Institutional Stay (SKILLED_NURSING_FACILITY): Payer: Medicare Other | Admitting: Adult Health

## 2023-09-15 ENCOUNTER — Encounter: Payer: Self-pay | Admitting: Adult Health

## 2023-09-15 DIAGNOSIS — Z85038 Personal history of other malignant neoplasm of large intestine: Secondary | ICD-10-CM | POA: Diagnosis not present

## 2023-09-15 DIAGNOSIS — N186 End stage renal disease: Secondary | ICD-10-CM | POA: Diagnosis not present

## 2023-09-15 DIAGNOSIS — N2581 Secondary hyperparathyroidism of renal origin: Secondary | ICD-10-CM | POA: Diagnosis not present

## 2023-09-15 DIAGNOSIS — E1169 Type 2 diabetes mellitus with other specified complication: Secondary | ICD-10-CM | POA: Diagnosis not present

## 2023-09-15 DIAGNOSIS — Z992 Dependence on renal dialysis: Secondary | ICD-10-CM | POA: Diagnosis not present

## 2023-09-15 DIAGNOSIS — K219 Gastro-esophageal reflux disease without esophagitis: Secondary | ICD-10-CM

## 2023-09-15 DIAGNOSIS — E785 Hyperlipidemia, unspecified: Secondary | ICD-10-CM | POA: Diagnosis not present

## 2023-09-15 DIAGNOSIS — N250 Renal osteodystrophy: Secondary | ICD-10-CM | POA: Diagnosis not present

## 2023-09-15 DIAGNOSIS — D631 Anemia in chronic kidney disease: Secondary | ICD-10-CM | POA: Diagnosis not present

## 2023-09-15 DIAGNOSIS — D509 Iron deficiency anemia, unspecified: Secondary | ICD-10-CM | POA: Diagnosis not present

## 2023-09-15 NOTE — Progress Notes (Signed)
Location:  [facility] Room Number: North/112/D Place of Service:  SNF (31)   CODE STATUS: Full Code  No Known Allergies  Chief Complaint  Patient presents with   Medical Management of Chronic Issues          Osteopenia:      GERD without esophagitis:      History of colon cancer:   Dyslipidemia associated with type 2 diabetes mellitus     HPI:  He is a 77 year old long term resident of this facility being seen for the management of his chronic illnesses: Osteopenia:      GERD without esophagitis:      History of colon cancer:   Dyslipidemia associated with type 2 diabetes mellitus. There are no reports of uncontrolled pain. He continues to go to dialysis three days weekly. His trig are elevated at 420. There are no reports of heart burn; should be able to stop prilosec.   Past Medical History:  Diagnosis Date   Abnormal CT scan, kidney 10/06/2011   Acute pyelonephritis 10/07/2011   Anemia    normocytic   Anxiety    mental retardation   Bladder wall thickening 10/06/2011   BPH (benign prostatic hypertrophy)    Diabetes mellitus    Dialysis patient (HCC)    Tuesday, Thursday and Saturday,    DVT of leg (deep venous thrombosis) (HCC) 12/25/2016   Edema     history of lower extremity edema   GERD (gastroesophageal reflux disease)    Heme positive stool    Hydronephrosis    Hyperkalemia    Hyperlipidemia    Hypernatremia    Hypertension    Hypothyroidism    Impaired speech    Infected prosthetic vascular graft (HCC)    MR (mental retardation)    Muscle weakness    Obstructive uropathy    Perinephric abscess 10/07/2011   Poor historian poor historian   Primary colorectal adenocarcinoma (HCC) 04/2017   S/p total colectomy   Protein calorie malnutrition (HCC)    Pyelonephritis    Renal failure (ARF), acute on chronic (HCC)    Renal insufficiency    chronic history   Sepsis (HCC)    Smoking    Uremia    Urinary retention    UTI (lower  urinary tract infection) 10/06/2011    Past Surgical History:  Procedure Laterality Date   A/V FISTULAGRAM N/A 08/13/2018   Procedure: A/V FISTULAGRAM - Right Upper;  Surgeon: Sherren Kerns, MD;  Location: [facility] INVASIVE CV LAB;  Service: Cardiovascular;  Laterality: N/A;   A/V FISTULAGRAM N/A 11/22/2018   Procedure: A/V FISTULAGRAM - Right Upper;  Surgeon: Maeola Harman, MD;  Location: [facility] INVASIVE CV LAB;  Service: Cardiovascular;  Laterality: N/A;   A/V FISTULAGRAM Left 10/08/2022   Procedure: A/V Fistulagram;  Surgeon: Victorino Sparrow, MD;  Location: [facility] INVASIVE CV LAB;  Service: Cardiovascular;  Laterality: Left;   A/V FISTULAGRAM Left 05/06/2023   Procedure: A/V Fistulagram;  Surgeon: Leonie Douglas, MD;  Location: [facility] INVASIVE CV LAB;  Service: Cardiovascular;  Laterality: Left;   AV FISTULA PLACEMENT Left 07/06/2015   Procedure:  INSERTION LEFT ARM ARTERIOVENOUS GORTEX GRAFT;  Surgeon: Chuck Hint, MD;  Location: [facility] OR;  Service: Vascular;  Laterality: Left;   AV FISTULA PLACEMENT Right 02/26/2016   Procedure: ARTERIOVENOUS (AV) FISTULA CREATION ;  Surgeon: Chuck Hint, MD;  Location: [facility] OR;  Service: Vascular;  Laterality: Right;   AV FISTULA PLACEMENT  Right 11/25/2018   Procedure: INSERTION OF ARTERIOVENOUS (AV) ARTEGRAFT RIGHT UPPER ARM;  Surgeon: Maeola Harman, MD;  Location: [facility] OR;  Service: Vascular;  Laterality: Right;   AV FISTULA PLACEMENT Left 05/28/2021   Procedure: LEFT ARM ARTERIOVENOUS (AV) FISTULA;  Surgeon: Larina Earthly, MD;  Location: [facility] ORS;  Service: Vascular;  Laterality: Left;   AVGG REMOVAL Left 10/09/2015   Procedure: REMOVAL OF ARTERIOVENOUS GORETEX GRAFT (AVGG) Evacuation of Lymphocele, Vein Patch angioplasty of brachial artery.;  Surgeon: Chuck Hint, MD;  Location: [facility] OR;  Service: Vascular;  Laterality: Left;   BASCILIC VEIN TRANSPOSITION Right 02/26/2016   Procedure: Right BASCILIC VEIN TRANSPOSITION;   Surgeon: Chuck Hint, MD;  Location: [facility] OR;  Service: Vascular;  Laterality: Right;   BASCILIC VEIN TRANSPOSITION Left 07/30/2021   Procedure: LEFT ARM SECOND STAGE BASILIC VEIN TRANSPOSITION;  Surgeon: Larina Earthly, MD;  Location: [facility] ORS;  Service: Vascular;  Laterality: Left;   CIRCUMCISION N/A 01/04/2014   Procedure: CIRCUMCISION ADULT (procedure #1);  Surgeon: Ky Barban, MD;  Location: [facility] ORS;  Service: Urology;  Laterality: N/A;   COLECTOMY N/A 05/04/2017   Procedure: TOTAL COLECTOMY;  Surgeon: Franky Macho, MD;  Location: [facility] ORS;  Service: General;  Laterality: N/A;   COLONOSCOPY N/A 04/27/2017   Surgeon: Corbin Ade, MD; annular mass in the ascending colon likely representing cancer biopsied, multiple 6-22 mm polyps removed, clean rectum.  Pathology with multiple tubular adenomas, high-grade dysplasia noted in ascending colon and splenic flexure.   CYSTOSCOPY W/ RETROGRADES Bilateral 06/29/2015   Procedure: CYSTOSCOPY, DILATION OF URETHRAL STRICTURE WITH BILATERAL RETROGRADE PYELOGRAM,SUPRAPUBIC TUBE CHANGE;  Surgeon: Jerilee Field, MD;  Location: [facility] ORS;  Service: Urology;  Laterality: Bilateral;   CYSTOSCOPY WITH URETHRAL DILATATION N/A 12/29/2013   Procedure: CYSTOSCOPY WITH URETHRAL DILATATION;  Surgeon: Ky Barban, MD;  Location: [facility] ORS;  Service: Urology;  Laterality: N/A;   ESOPHAGOGASTRODUODENOSCOPY N/A 04/27/2017   Procedure: ESOPHAGOGASTRODUODENOSCOPY (EGD);  Surgeon: Corbin Ade, MD;  [facility];  Service: Endoscopy;  Laterality: N/A;   FLEXIBLE SIGMOIDOSCOPY N/A 12/02/2021   Procedure: FLEXIBLE SIGMOIDOSCOPY;  Surgeon: Lanelle Bal, DO;  Location: [facility];  Service: Endoscopy;  Laterality: N/A;  10:30am, dialysis patient   INSERTION OF DIALYSIS CATHETER Right 11/25/2018   Procedure: INSERTION OF DIALYSIS CATHETER RIGHT INTERNAL JUGULAR;  Surgeon: Maeola Harman, MD;  Location: [facility] OR;  Service: Vascular;   Laterality: Right;   IR AV DIALY SHUNT INTRO NEEDLE/INTRACATH INITIAL W/PTA/IMG RIGHT Right 09/07/2018   IR FLUORO GUIDE CV LINE RIGHT  10/16/2020   IR FLUORO GUIDE CV LINE RIGHT  10/03/2022   IR REMOVAL TUN CV CATH W/O FL  01/12/2019   IR THROMBECTOMY AV FISTULA W/THROMBOLYSIS/PTA INC/SHUNT/IMG RIGHT Right 04/26/2018   IR US GUIDE VASC ACCESS RIGHT  04/26/2018   IR US GUIDE VASC ACCESS RIGHT  09/07/2018   IR US GUIDE VASC ACCESS RIGHT  10/16/2020   IR US GUIDE VASC ACCESS RIGHT  10/16/2020   IR US GUIDE VASC ACCESS RIGHT  10/03/2022   ORIF FEMUR FRACTURE Right 11/22/2016   Procedure: OPEN REDUCTION INTERNAL FIXATION (ORIF) DISTAL FEMUR FRACTURE;  Surgeon: Samson Frederic, MD;  Location: [facility] OR;  Service: Orthopedics;  Laterality: Right;   PATCH ANGIOPLASTY Right 12/10/2017   Procedure: PATCH ANGIOPLASTY;  Surgeon: Chuck Hint, MD;  [facility] OR;  Service: Vascular;  Laterality: Right;   PERIPHERAL VASCULAR BALLOON ANGIOPLASTY  08/13/2018   Procedure: PERIPHERAL VASCULAR  BALLOON ANGIOPLASTY;  Surgeon: Sherren Kerns, MD;  Location: [facility] INVASIVE CV LAB;  Service: Cardiovascular;;  right AV fistula    PERIPHERAL VASCULAR BALLOON ANGIOPLASTY  11/22/2018   Procedure: PERIPHERAL VASCULAR BALLOON ANGIOPLASTY;  Surgeon: Maeola Harman, MD;  Location: [facility] INVASIVE CV LAB;  Service: Cardiovascular;;  rt AV fistula   PERIPHERAL VASCULAR BALLOON ANGIOPLASTY  10/08/2022   Procedure: PERIPHERAL VASCULAR BALLOON ANGIOPLASTY;  Surgeon: Victorino Sparrow, MD;  Location: [facility] INVASIVE CV LAB;  Service: Cardiovascular;;  arterial anastamosis   PERIPHERAL VASCULAR CATHETERIZATION N/A 10/08/2015   Procedure: A/V Shuntogram;  Surgeon: Chuck Hint, MD;  Location: [facility] INVASIVE CV LAB;  Service: Cardiovascular;  Laterality: N/A;   PERIPHERAL VASCULAR INTERVENTION Left 10/08/2022   Procedure: PERIPHERAL VASCULAR INTERVENTION;  Surgeon: Victorino Sparrow, MD;  Location: [facility] INVASIVE CV LAB;   Service: Cardiovascular;  Laterality: Left;  left AVF   REMOVAL OF A DIALYSIS CATHETER N/A 12/03/2021   Procedure: MINOR REMOVAL OF A TUNNELED DIALYSIS CATHETER;  Surgeon: Larina Earthly, MD;  Location: [facility]S;  Service: Vascular;  Laterality: N/A;   REMOVAL OF A DIALYSIS CATHETER N/A 11/18/2022   Procedure: MINOR REMOVAL OF A TUNNELED DIALYSIS CATHETER;  Surgeon: Larina Earthly, MD;  Location: [facility]S;  Service: Vascular;  Laterality: N/A;   THROMBECTOMY W/ EMBOLECTOMY Right 12/10/2017   Procedure: THROMBECTOMY REVISION RIGHT ARM  ARTERIOVENOUS FISTULA;  Surgeon: Chuck Hint, MD;  Location: [facility] OR;  Service: Vascular;  Laterality: Right;   TRANSURETHRAL RESECTION OF PROSTATE N/A 01/04/2014   Procedure: TRANSURETHRAL RESECTION OF THE PROSTATE (TURP) (procedure #2);  Surgeon: Ky Barban, MD;  Location: [facility];  Service: Urology;  Laterality: N/A;    Social History   Socioeconomic History   Marital status: Single    Spouse name: Not on file   Number of children: Not on file   Years of education: Not on file   Highest education level: Not on file  Occupational History   Occupation: retired   Tobacco Use   Smoking status: Never   Smokeless tobacco: Never  Vaping Use   Vaping status: Never Used  Substance and Sexual Activity   Alcohol use: No   Drug use: No   Sexual activity: Not Currently  Other Topics Concern   Not on file  Social History Narrative   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   Long term resident of [facility]    Social Determinants of Health   Financial Resource Strain: Low Risk  (02/18/2018)   Overall Financial Resource Strain (CARDIA)    Difficulty of Paying Living Expenses: Not hard at all  Food Insecurity: No Food Insecurity (02/18/2018)   Hunger Vital Sign    Worried About Running Out of Food in the Last Year: Never true    Ran Out of Food in the Last Year: Never true  Transportation Needs: No Transportation Needs  (02/18/2018)   PRAPARE - Administrator, Civil Service (Medical): No    Lack of Transportation (Non-Medical): No  Physical Activity: Unknown (09/15/2019)   Exercise Vital Sign    Days of Exercise per Week: Not on file    Minutes of Exercise per Session: 0 min  Stress: No Stress Concern Present (02/18/2018)   [employer] of Occupational Health - Occupational Stress Questionnaire    Feeling of Stress : Not at all  Social Connections: Unknown (09/15/2019)   Social Connection and Isolation Panel [NHANES]    Frequency of  Communication with Friends and Family: Not on file    Frequency of Social Gatherings with Friends and Family: Never    Attends Religious Services: Not on file    Active Member of Clubs or Organizations: Not on file    Attends Banker Meetings: Not on file    Marital Status: Not on file  Intimate Partner Violence: Not At Risk (02/18/2018)   Humiliation, Afraid, Rape, and Kick questionnaire    Fear of Current or Ex-Partner: No    Emotionally Abused: No    Physically Abused: No    Sexually Abused: No   Family History  Problem Relation Age of Onset   Cancer Mother    Colon cancer Neg Hx       VITAL SIGNS BP 126/73   Pulse 82   Temp 97.9 F (36.6 C)   Resp 20   Ht 5\' 8"  (1.727 m)   Wt 213 lb 6.4 oz (96.8 kg)   SpO2 95%   BMI 32.45 kg/m   Outpatient Encounter Medications as of 09/15/2023  Medication Sig   aspirin EC 81 MG tablet Take 81 mg by mouth daily. 0900   Insulin Glargine (BASAGLAR KWIKPEN) 100 UNIT/ML Inject 8 Units into the skin at bedtime. 2200   Insulin Pen Needle (BD AUTOSHIELD DUO) 30G X 5 MM MISC by Does not apply route. 3/16"   levothyroxine (SYNTHROID) 125 MCG tablet Take 125 mcg by mouth at bedtime. 10 am, Special Instructions: Must be given (1) hour apart from food or other medications per pharmacy recommendations   midodrine (PROAMATINE) 10 MG tablet Take 10 mg by mouth See admin instructions. Send med with  resident on  Tues/Thurs/Sat to dialysis. do not give at [facility], give to resident to take with him. send with 5mg  to equal 15mg . 6:00   midodrine (PROAMATINE) 5 MG tablet Take 5 mg by mouth See admin instructions. Send with resident on dialysis days.  Tues/Thurs/Sat  Take along with 10 mg to equal 15 mg 6:00   NON FORMULARY Diet: Regular diet   Nutritional Supplements (,FEEDING SUPPLEMENT, PROSOURCE PLUS) liquid Take 30 mLs by mouth 2 (two) times daily between meals. 1600 and 2200   omega-3 acid ethyl esters (LOVAZA) 1 g capsule Take 2 g by mouth at bedtime. 2200   omeprazole (PRILOSEC) 40 MG capsule Take 40 mg by mouth at bedtime. 2200   polyethylene glycol (MIRALAX / GLYCOLAX) packet Take 17 g by mouth daily as needed for mild constipation.    rosuvastatin (CRESTOR) 10 MG tablet Take 10 mg by mouth at bedtime.   sevelamer carbonate (RENVELA) 800 MG tablet Take 3 tablets (2,400 mg total) by mouth 3 (three) times daily with meals.   tamsulosin (FLOMAX) 0.4 MG CAPS capsule Take 0.4 mg by mouth at bedtime. Give 30 minutes after a meal. Do not crush or chew   torsemide (DEMADEX) 20 MG tablet Take 20 mg by mouth See admin instructions. 20mg  daily at 2000 on Sunday, Monday, Wednesday, and Friday only.   vitamin B-12 (CYANOCOBALAMIN) 1000 MCG tablet Take 1,000 mcg by mouth at bedtime.   No facility-administered encounter medications on file as of 09/15/2023.     SIGNIFICANT DIAGNOSTIC EXAMS  PREVIOUS   06-11-22: dexa: t score: -2.052  NO NEW EXAMS.    LABS REVIEWED PREVIOUS;      12 -4-23: wbc 4.3; hgb 12.9; hct 41.1; mcv 108.4 plt 108; glucose 112; bun 44; creat 16.87; k+ 4.6; na++ 139; ca 9.3 gfr 3; protein  7.0; albumin 3.5; hgb A1c 5.0; tsh 5.861 chol 89; ldl 41; trig 68; hdl 35 10-08-22: glucose 101; bun 22; creat 12.30; k+ 5.2; na++ 141; ion ca 1.14;  02-13-23: wbc 3.8; hgb 9.7; hct 28.7; mcv 101.1 plt 126; glucose 67; bun 18; creat 6.28; k+ 2.9; na++ 137; ca 8.8; gfr 9; protein 6.9;  albumin 3.2 mag 1.8; occult blood: neg 02-23-23: hgb 9.8; hct 29.7 glucose 110; bun 24; creat 8.32 k+ 3.6; na++ 139; ca 9.6 gfr 6  06-11-23: wbc 4.4; hgb 11.7; hct 35.2; mcv 102.3 plt 88; glucose 160; bun 20; creat 4.71; k+ 3.5; na++ 137; ca 8.4; gfr 12   06-13-23: wbc 3.6; hgb 13.0; hct 39.2; mcv 102.6 plt 110; glucose 103; bun 11; creat 4.13; k+ 3.3; na++ 138; ca 8.6; gfr 14; d-dimer 0.54; CRP <0.5  06-16-23: glucose 99; bun 40; creat 10.88; k+ 4.6; na++ 136; ca 8.8; gfr 5  TODAY  07-16-23: hgb A1c 5.7; chol 134; ldl 39; trig 486; hdl 24 09-03-23: tsh 3.852 free t4: 0.82 09-14-23: protein 6.9 albumin 3.2; chol 127; ldl 37 trig 420 hdl 25   Review of Systems  Constitutional:  Negative for malaise/fatigue.  Respiratory:  Negative for cough and shortness of breath.   Cardiovascular:  Negative for chest pain, palpitations and leg swelling.  Gastrointestinal:  Negative for abdominal pain, constipation and heartburn.  Musculoskeletal:  Negative for back pain, joint pain and myalgias.  Skin: Negative.   Neurological:  Negative for dizziness.  Psychiatric/Behavioral:  The patient is not nervous/anxious.    Physical Exam Constitutional:      General: He is not in acute distress.    Appearance: He is well-developed. He is obese. He is not diaphoretic.  Neck:     Thyroid: No thyromegaly.  Cardiovascular:     Rate and Rhythm: Normal rate and regular rhythm.     Pulses: Normal pulses.     Heart sounds: Normal heart sounds.  Pulmonary:     Effort: Pulmonary effort is normal. No respiratory distress.     Breath sounds: Normal breath sounds.  Abdominal:     General: Bowel sounds are normal. There is no distension.     Palpations: Abdomen is soft.     Tenderness: There is no abdominal tenderness.  Musculoskeletal:        General: Normal range of motion.     Cervical back: Neck supple.     Right lower leg: No edema.     Left lower leg: No edema.     Comments: Left arm fistula +thrill+bruit      Lymphadenopathy:     Cervical: No cervical adenopathy.  Skin:    General: Skin is warm and dry.  Neurological:     Mental Status: He is alert. Mental status is at baseline.     Comments: 02-27-23: SLUMS 2/30      Psychiatric:        Mood and Affect: Mood normal.    ASSESSMENT/ PLAN:  TODAY  Osteopenia: t score -2.052 is on supplements  2. GERD without esophagitis: will stop prilosec  3. History of colon cancer: s/p colectomy last seen by GI on 12-19-21; ct scan done on 12-30-21.  4. Dyslipidemia associated with type 2 diabetes mellitus: ldl 37 trig 420 will increase crestor to 20 mg daily     PREVIOUS   5. Vitamin B 12 deficiency: is on daily supplement  6. Secondary hyperparathyroidism of renal origin: will monitor   7. Bilateral lower extremity  edema: will continue demadex 20 mg four times weekly  8. Type 2 diabetes mellitus with hypertension and end stage renal disease on hemodialysis: hgb A1c 5.0; will continue basaglar 8 units nightly is on asa and statin  9. Thrombocytopenia: plt 110  10. Aortic atherosclerosis (ct 12-30-21) is on asa and statin  11. 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 : is on hemodialysis three days weekly; is no longer on fluid restriction as he is unable to adhere; renvela 2400 mg three times daily   12. Other specified hypothyroidism: tsh 3.852 will continue synthroid 125 mcg daily   13. 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 Major depression single episode moderate: is presently off medications.    Synthia Innocent NP [facility]  call [phone]

## 2023-09-17 DIAGNOSIS — N2581 Secondary hyperparathyroidism of renal origin: Secondary | ICD-10-CM | POA: Diagnosis not present

## 2023-09-17 DIAGNOSIS — N186 End stage renal disease: Secondary | ICD-10-CM | POA: Diagnosis not present

## 2023-09-17 DIAGNOSIS — Z992 Dependence on renal dialysis: Secondary | ICD-10-CM | POA: Diagnosis not present

## 2023-09-17 DIAGNOSIS — D509 Iron deficiency anemia, unspecified: Secondary | ICD-10-CM | POA: Diagnosis not present

## 2023-09-17 DIAGNOSIS — N250 Renal osteodystrophy: Secondary | ICD-10-CM | POA: Diagnosis not present

## 2023-09-17 DIAGNOSIS — D631 Anemia in chronic kidney disease: Secondary | ICD-10-CM | POA: Diagnosis not present

## 2023-09-19 DIAGNOSIS — D509 Iron deficiency anemia, unspecified: Secondary | ICD-10-CM | POA: Diagnosis not present

## 2023-09-19 DIAGNOSIS — Z992 Dependence on renal dialysis: Secondary | ICD-10-CM | POA: Diagnosis not present

## 2023-09-19 DIAGNOSIS — N186 End stage renal disease: Secondary | ICD-10-CM | POA: Diagnosis not present

## 2023-09-19 DIAGNOSIS — N2581 Secondary hyperparathyroidism of renal origin: Secondary | ICD-10-CM | POA: Diagnosis not present

## 2023-09-19 DIAGNOSIS — D631 Anemia in chronic kidney disease: Secondary | ICD-10-CM | POA: Diagnosis not present

## 2023-09-19 DIAGNOSIS — N250 Renal osteodystrophy: Secondary | ICD-10-CM | POA: Diagnosis not present

## 2023-09-22 DIAGNOSIS — N186 End stage renal disease: Secondary | ICD-10-CM | POA: Diagnosis not present

## 2023-09-22 DIAGNOSIS — D631 Anemia in chronic kidney disease: Secondary | ICD-10-CM | POA: Diagnosis not present

## 2023-09-22 DIAGNOSIS — Z992 Dependence on renal dialysis: Secondary | ICD-10-CM | POA: Diagnosis not present

## 2023-09-22 DIAGNOSIS — D509 Iron deficiency anemia, unspecified: Secondary | ICD-10-CM | POA: Diagnosis not present

## 2023-09-22 DIAGNOSIS — N250 Renal osteodystrophy: Secondary | ICD-10-CM | POA: Diagnosis not present

## 2023-09-22 DIAGNOSIS — N2581 Secondary hyperparathyroidism of renal origin: Secondary | ICD-10-CM | POA: Diagnosis not present

## 2023-09-24 DIAGNOSIS — N2581 Secondary hyperparathyroidism of renal origin: Secondary | ICD-10-CM | POA: Diagnosis not present

## 2023-09-24 DIAGNOSIS — N250 Renal osteodystrophy: Secondary | ICD-10-CM | POA: Diagnosis not present

## 2023-09-24 DIAGNOSIS — D631 Anemia in chronic kidney disease: Secondary | ICD-10-CM | POA: Diagnosis not present

## 2023-09-24 DIAGNOSIS — Z992 Dependence on renal dialysis: Secondary | ICD-10-CM | POA: Diagnosis not present

## 2023-09-24 DIAGNOSIS — D509 Iron deficiency anemia, unspecified: Secondary | ICD-10-CM | POA: Diagnosis not present

## 2023-09-24 DIAGNOSIS — N186 End stage renal disease: Secondary | ICD-10-CM | POA: Diagnosis not present

## 2023-09-26 DIAGNOSIS — D509 Iron deficiency anemia, unspecified: Secondary | ICD-10-CM | POA: Diagnosis not present

## 2023-09-26 DIAGNOSIS — N2581 Secondary hyperparathyroidism of renal origin: Secondary | ICD-10-CM | POA: Diagnosis not present

## 2023-09-26 DIAGNOSIS — N186 End stage renal disease: Secondary | ICD-10-CM | POA: Diagnosis not present

## 2023-09-26 DIAGNOSIS — Z992 Dependence on renal dialysis: Secondary | ICD-10-CM | POA: Diagnosis not present

## 2023-09-26 DIAGNOSIS — N250 Renal osteodystrophy: Secondary | ICD-10-CM | POA: Diagnosis not present

## 2023-09-26 DIAGNOSIS — D631 Anemia in chronic kidney disease: Secondary | ICD-10-CM | POA: Diagnosis not present

## 2023-09-29 DIAGNOSIS — N250 Renal osteodystrophy: Secondary | ICD-10-CM | POA: Diagnosis not present

## 2023-09-29 DIAGNOSIS — Z992 Dependence on renal dialysis: Secondary | ICD-10-CM | POA: Diagnosis not present

## 2023-09-29 DIAGNOSIS — D631 Anemia in chronic kidney disease: Secondary | ICD-10-CM | POA: Diagnosis not present

## 2023-09-29 DIAGNOSIS — N2581 Secondary hyperparathyroidism of renal origin: Secondary | ICD-10-CM | POA: Diagnosis not present

## 2023-09-29 DIAGNOSIS — N186 End stage renal disease: Secondary | ICD-10-CM | POA: Diagnosis not present

## 2023-09-29 DIAGNOSIS — D509 Iron deficiency anemia, unspecified: Secondary | ICD-10-CM | POA: Diagnosis not present

## 2023-10-01 DIAGNOSIS — N2581 Secondary hyperparathyroidism of renal origin: Secondary | ICD-10-CM | POA: Diagnosis not present

## 2023-10-01 DIAGNOSIS — N250 Renal osteodystrophy: Secondary | ICD-10-CM | POA: Diagnosis not present

## 2023-10-01 DIAGNOSIS — D509 Iron deficiency anemia, unspecified: Secondary | ICD-10-CM | POA: Diagnosis not present

## 2023-10-01 DIAGNOSIS — D631 Anemia in chronic kidney disease: Secondary | ICD-10-CM | POA: Diagnosis not present

## 2023-10-01 DIAGNOSIS — N186 End stage renal disease: Secondary | ICD-10-CM | POA: Diagnosis not present

## 2023-10-01 DIAGNOSIS — Z992 Dependence on renal dialysis: Secondary | ICD-10-CM | POA: Diagnosis not present

## 2023-10-03 DIAGNOSIS — D509 Iron deficiency anemia, unspecified: Secondary | ICD-10-CM | POA: Diagnosis not present

## 2023-10-03 DIAGNOSIS — N250 Renal osteodystrophy: Secondary | ICD-10-CM | POA: Diagnosis not present

## 2023-10-03 DIAGNOSIS — N2581 Secondary hyperparathyroidism of renal origin: Secondary | ICD-10-CM | POA: Diagnosis not present

## 2023-10-03 DIAGNOSIS — Z992 Dependence on renal dialysis: Secondary | ICD-10-CM | POA: Diagnosis not present

## 2023-10-03 DIAGNOSIS — D631 Anemia in chronic kidney disease: Secondary | ICD-10-CM | POA: Diagnosis not present

## 2023-10-03 DIAGNOSIS — N186 End stage renal disease: Secondary | ICD-10-CM | POA: Diagnosis not present

## 2023-10-06 DIAGNOSIS — D509 Iron deficiency anemia, unspecified: Secondary | ICD-10-CM | POA: Diagnosis not present

## 2023-10-06 DIAGNOSIS — N250 Renal osteodystrophy: Secondary | ICD-10-CM | POA: Diagnosis not present

## 2023-10-06 DIAGNOSIS — D631 Anemia in chronic kidney disease: Secondary | ICD-10-CM | POA: Diagnosis not present

## 2023-10-06 DIAGNOSIS — N186 End stage renal disease: Secondary | ICD-10-CM | POA: Diagnosis not present

## 2023-10-06 DIAGNOSIS — N2581 Secondary hyperparathyroidism of renal origin: Secondary | ICD-10-CM | POA: Diagnosis not present

## 2023-10-06 DIAGNOSIS — Z992 Dependence on renal dialysis: Secondary | ICD-10-CM | POA: Diagnosis not present

## 2023-10-08 DIAGNOSIS — Z992 Dependence on renal dialysis: Secondary | ICD-10-CM | POA: Diagnosis not present

## 2023-10-08 DIAGNOSIS — N250 Renal osteodystrophy: Secondary | ICD-10-CM | POA: Diagnosis not present

## 2023-10-08 DIAGNOSIS — N186 End stage renal disease: Secondary | ICD-10-CM | POA: Diagnosis not present

## 2023-10-08 DIAGNOSIS — D631 Anemia in chronic kidney disease: Secondary | ICD-10-CM | POA: Diagnosis not present

## 2023-10-08 DIAGNOSIS — D509 Iron deficiency anemia, unspecified: Secondary | ICD-10-CM | POA: Diagnosis not present

## 2023-10-08 DIAGNOSIS — N2581 Secondary hyperparathyroidism of renal origin: Secondary | ICD-10-CM | POA: Diagnosis not present

## 2023-10-10 DIAGNOSIS — N250 Renal osteodystrophy: Secondary | ICD-10-CM | POA: Diagnosis not present

## 2023-10-10 DIAGNOSIS — Z992 Dependence on renal dialysis: Secondary | ICD-10-CM | POA: Diagnosis not present

## 2023-10-10 DIAGNOSIS — N2581 Secondary hyperparathyroidism of renal origin: Secondary | ICD-10-CM | POA: Diagnosis not present

## 2023-10-10 DIAGNOSIS — D509 Iron deficiency anemia, unspecified: Secondary | ICD-10-CM | POA: Diagnosis not present

## 2023-10-10 DIAGNOSIS — N186 End stage renal disease: Secondary | ICD-10-CM | POA: Diagnosis not present

## 2023-10-10 DIAGNOSIS — D631 Anemia in chronic kidney disease: Secondary | ICD-10-CM | POA: Diagnosis not present

## 2023-10-12 DIAGNOSIS — H25813 Combined forms of age-related cataract, bilateral: Secondary | ICD-10-CM | POA: Diagnosis not present

## 2023-10-12 DIAGNOSIS — H524 Presbyopia: Secondary | ICD-10-CM | POA: Diagnosis not present

## 2023-10-12 DIAGNOSIS — E113593 Type 2 diabetes mellitus with proliferative diabetic retinopathy without macular edema, bilateral: Secondary | ICD-10-CM | POA: Diagnosis not present

## 2023-10-13 ENCOUNTER — Encounter: Payer: Self-pay | Admitting: Adult Health

## 2023-10-13 ENCOUNTER — Non-Acute Institutional Stay (SKILLED_NURSING_FACILITY): Payer: Self-pay | Admitting: Adult Health

## 2023-10-13 DIAGNOSIS — I120 Hypertensive chronic kidney disease with stage 5 chronic kidney disease or end stage renal disease: Secondary | ICD-10-CM

## 2023-10-13 DIAGNOSIS — N2581 Secondary hyperparathyroidism of renal origin: Secondary | ICD-10-CM

## 2023-10-13 DIAGNOSIS — N186 End stage renal disease: Secondary | ICD-10-CM | POA: Diagnosis not present

## 2023-10-13 DIAGNOSIS — R600 Localized edema: Secondary | ICD-10-CM | POA: Diagnosis not present

## 2023-10-13 DIAGNOSIS — E1122 Type 2 diabetes mellitus with diabetic chronic kidney disease: Secondary | ICD-10-CM | POA: Diagnosis not present

## 2023-10-13 DIAGNOSIS — Z992 Dependence on renal dialysis: Secondary | ICD-10-CM | POA: Diagnosis not present

## 2023-10-13 DIAGNOSIS — E538 Deficiency of other specified B group vitamins: Secondary | ICD-10-CM | POA: Diagnosis not present

## 2023-10-13 DIAGNOSIS — N250 Renal osteodystrophy: Secondary | ICD-10-CM | POA: Diagnosis not present

## 2023-10-13 DIAGNOSIS — D631 Anemia in chronic kidney disease: Secondary | ICD-10-CM | POA: Diagnosis not present

## 2023-10-13 DIAGNOSIS — D509 Iron deficiency anemia, unspecified: Secondary | ICD-10-CM | POA: Diagnosis not present

## 2023-10-13 NOTE — Progress Notes (Signed)
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 There are no reports of uncontrolled pain. He does get out of bed daily; continues with 3 days per week dialysis.   Past Medical History:  Diagnosis Date   Abnormal CT scan, kidney 10/06/2011   Acute pyelonephritis 10/07/2011   Anemia    normocytic   Anxiety    mental retardation   Bladder wall thickening 10/06/2011   BPH (benign prostatic hypertrophy)    Diabetes mellitus    Dialysis patient (HCC)    Tuesday, Thursday and Saturday,    DVT of leg (deep venous thrombosis) (HCC) 12/25/2016   Edema     history of lower extremity edema   GERD (gastroesophageal reflux disease)    Heme positive stool    Hydronephrosis    Hyperkalemia    Hyperlipidemia    Hypernatremia    Hypertension    Hypothyroidism    Impaired speech    Infected prosthetic vascular graft (HCC)    MR (mental retardation)    Muscle weakness    Obstructive uropathy    Perinephric abscess 10/07/2011   Poor historian poor historian   Primary colorectal adenocarcinoma (HCC) 04/2017   S/p total colectomy   Protein calorie malnutrition (HCC)    Pyelonephritis    Renal failure (ARF), acute on chronic (HCC)    Renal insufficiency    chronic history   Sepsis (HCC)     Smoking    Uremia    Urinary retention    UTI (lower urinary tract infection) 10/06/2011    Past Surgical History:  Procedure Laterality Date   A/V FISTULAGRAM N/A 08/13/2018   Procedure: A/V FISTULAGRAM - Right Upper;  Surgeon: Sherren Kerns, MD;  Location: [facility] INVASIVE CV LAB;  Service: Cardiovascular;  Laterality: N/A;   A/V FISTULAGRAM N/A 11/22/2018   Procedure: A/V FISTULAGRAM - Right Upper;  Surgeon: Maeola Harman, MD;  Location: [facility] INVASIVE CV LAB;  Service: Cardiovascular;  Laterality: N/A;   A/V FISTULAGRAM Left 10/08/2022   Procedure: A/V Fistulagram;  Surgeon: Victorino Sparrow, MD;  Location: [facility] INVASIVE CV LAB;  Service: Cardiovascular;  Laterality: Left;   A/V FISTULAGRAM Left 05/06/2023   Procedure: A/V Fistulagram;  Surgeon: Leonie Douglas, MD;  Location: [facility] INVASIVE CV LAB;  Service: Cardiovascular;  Laterality: Left;   AV FISTULA PLACEMENT Left 07/06/2015   Procedure:  INSERTION LEFT ARM ARTERIOVENOUS GORTEX GRAFT;  Surgeon: Chuck Hint, MD;  Location: [facility] OR;  Service: Vascular;  Laterality: Left;   AV FISTULA PLACEMENT Right 02/26/2016   Procedure: ARTERIOVENOUS (AV) FISTULA CREATION ;  Surgeon: Chuck Hint, MD;  Location: [facility] OR;  Service: Vascular;  Laterality: Right;  AV FISTULA PLACEMENT Right 11/25/2018   Procedure: INSERTION OF ARTERIOVENOUS (AV) ARTEGRAFT RIGHT UPPER ARM;  Surgeon: Maeola Harman, MD;  Location: [facility] OR;  Service: Vascular;  Laterality: Right;   AV FISTULA PLACEMENT Left 05/28/2021   Procedure: LEFT ARM ARTERIOVENOUS (AV) FISTULA;  Surgeon: Larina Earthly, MD;  Location: [facility] ORS;  Service: Vascular;  Laterality: Left;   AVGG REMOVAL Left 10/09/2015   Procedure: REMOVAL OF ARTERIOVENOUS GORETEX GRAFT (AVGG) Evacuation of Lymphocele, Vein Patch angioplasty of brachial artery.;  Surgeon: Chuck Hint, MD;  Location: [facility] OR;  Service: Vascular;  Laterality: Left;   BASCILIC VEIN TRANSPOSITION Right  02/26/2016   Procedure: Right BASCILIC VEIN TRANSPOSITION;  Surgeon: Chuck Hint, MD;  Location: [facility] OR;  Service: Vascular;  Laterality: Right;   BASCILIC VEIN TRANSPOSITION Left 07/30/2021   Procedure: LEFT ARM SECOND STAGE BASILIC VEIN TRANSPOSITION;  Surgeon: Larina Earthly, MD;  Location: [facility] ORS;  Service: Vascular;  Laterality: Left;   CIRCUMCISION N/A 01/04/2014   Procedure: CIRCUMCISION ADULT (procedure #1);  Surgeon: Ky Barban, MD;  Location: [facility] ORS;  Service: Urology;  Laterality: N/A;   COLECTOMY N/A 05/04/2017   Procedure: TOTAL COLECTOMY;  Surgeon: Franky Macho, MD;  Location: [facility] ORS;  Service: General;  Laterality: N/A;   COLONOSCOPY N/A 04/27/2017   Surgeon: Corbin Ade, MD; annular mass in the ascending colon likely representing cancer biopsied, multiple 6-22 mm polyps removed, clean rectum.  Pathology with multiple tubular adenomas, high-grade dysplasia noted in ascending colon and splenic flexure.   CYSTOSCOPY W/ RETROGRADES Bilateral 06/29/2015   Procedure: CYSTOSCOPY, DILATION OF URETHRAL STRICTURE WITH BILATERAL RETROGRADE PYELOGRAM,SUPRAPUBIC TUBE CHANGE;  Surgeon: Jerilee Field, MD;  Location: [facility] ORS;  Service: Urology;  Laterality: Bilateral;   CYSTOSCOPY WITH URETHRAL DILATATION N/A 12/29/2013   Procedure: CYSTOSCOPY WITH URETHRAL DILATATION;  Surgeon: Ky Barban, MD;  Location: [facility]S;  Service: Urology;  Laterality: N/A;   ESOPHAGOGASTRODUODENOSCOPY N/A 04/27/2017   Procedure: ESOPHAGOGASTRODUODENOSCOPY (EGD);  Surgeon: Corbin Ade, MD;  Location: [facility];  Service: Endoscopy;  Laterality: N/A;   FLEXIBLE SIGMOIDOSCOPY N/A 12/02/2021   Procedure: FLEXIBLE SIGMOIDOSCOPY;  Surgeon: Lanelle Bal, DO;  Location: [facility];  Service: Endoscopy;  Laterality: N/A;  10:30am, dialysis patient   INSERTION OF DIALYSIS CATHETER Right 11/25/2018   Procedure: INSERTION OF DIALYSIS CATHETER RIGHT INTERNAL JUGULAR;  Surgeon: Maeola Harman, MD;  Location: [facility] OR;  Service: Vascular;  Laterality: Right;   IR AV DIALY SHUNT INTRO NEEDLE/INTRACATH INITIAL W/PTA/IMG RIGHT Right 09/07/2018   IR FLUORO GUIDE CV LINE RIGHT  10/16/2020   IR FLUORO GUIDE CV LINE RIGHT  10/03/2022   IR REMOVAL TUN CV CATH W/O FL  01/12/2019   IR THROMBECTOMY AV FISTULA W/THROMBOLYSIS/PTA INC/SHUNT/IMG RIGHT Right 04/26/2018   IR US GUIDE VASC ACCESS RIGHT  04/26/2018   IR US GUIDE VASC ACCESS RIGHT  09/07/2018   IR US GUIDE VASC ACCESS RIGHT  10/16/2020   IR US GUIDE VASC ACCESS RIGHT  10/16/2020   IR US GUIDE VASC ACCESS RIGHT  10/03/2022   ORIF FEMUR FRACTURE Right 11/22/2016   Procedure: OPEN REDUCTION INTERNAL FIXATION (ORIF) DISTAL FEMUR FRACTURE;  Surgeon: Samson Frederic, MD;  Location: [facility] OR;  Service: Orthopedics;  Laterality: Right;   PATCH ANGIOPLASTY Right 12/10/2017   Procedure: PATCH ANGIOPLASTY;  Surgeon: Chuck Hint, MD;  Location: [facility] OR;  Service: Vascular;  Laterality: Right;   PERIPHERAL VASCULAR BALLOON ANGIOPLASTY  08/13/2018  Procedure: PERIPHERAL VASCULAR BALLOON ANGIOPLASTY;  Surgeon: Sherren Kerns, MD;  Location: [facility] INVASIVE CV LAB;  Service: Cardiovascular;;  right AV fistula    PERIPHERAL VASCULAR BALLOON ANGIOPLASTY  11/22/2018   Procedure: PERIPHERAL VASCULAR BALLOON ANGIOPLASTY;  Surgeon: Maeola Harman, MD;  Location: [facility] INVASIVE CV LAB;  Service: Cardiovascular;;  rt AV fistula   PERIPHERAL VASCULAR BALLOON ANGIOPLASTY  10/08/2022   Procedure: PERIPHERAL VASCULAR BALLOON ANGIOPLASTY;  Surgeon: Victorino Sparrow, MD;  Location: [facility] INVASIVE CV LAB;  Service: Cardiovascular;;  arterial anastamosis   PERIPHERAL VASCULAR CATHETERIZATION N/A 10/08/2015   Procedure: A/V Shuntogram;  Surgeon: Chuck Hint, MD;  Location: [facility] INVASIVE CV LAB;  Service: Cardiovascular;  Laterality: N/A;   PERIPHERAL VASCULAR INTERVENTION Left 10/08/2022   Procedure: PERIPHERAL VASCULAR INTERVENTION;   Surgeon: Victorino Sparrow, MD;  Location: [facility] INVASIVE CV LAB;  Service: Cardiovascular;  Laterality: Left;  left AVF   REMOVAL OF A DIALYSIS CATHETER N/A 12/03/2021   Procedure: MINOR REMOVAL OF A TUNNELED DIALYSIS CATHETER;  Surgeon: Larina Earthly, MD;  Location: [facility]S;  Service: Vascular;  Laterality: N/A;   REMOVAL OF A DIALYSIS CATHETER N/A 11/18/2022   Procedure: MINOR REMOVAL OF A TUNNELED DIALYSIS CATHETER;  Surgeon: Larina Earthly, MD;  Location: [facility];  Service: Vascular;  Laterality: N/A;   THROMBECTOMY W/ EMBOLECTOMY Right 12/10/2017   Procedure: THROMBECTOMY REVISION RIGHT ARM  ARTERIOVENOUS FISTULA;  Surgeon: Chuck Hint, MD;  Location: [facility] OR;  Service: Vascular;  Laterality: Right;   TRANSURETHRAL RESECTION OF PROSTATE N/A 01/04/2014   Procedure: TRANSURETHRAL RESECTION OF THE PROSTATE (TURP) (procedure #2);  Surgeon: Ky Barban, MD;  Location: [facility];  Service: Urology;  Laterality: N/A;    Social History   Socioeconomic History   Marital status: Single    Spouse name: Not on file   Number of children: Not on file   Years of education: Not on file   Highest education level: Not on file  Occupational History   Occupation: retired   Tobacco Use   Smoking status: Never   Smokeless tobacco: Never  Vaping Use   Vaping status: Never Used  Substance and Sexual Activity   Alcohol use: No   Drug use: No   Sexual activity: Not Currently  Other Topics Concern   Not on file  Social History Narrative   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   Long term resident of [facility]    Social Drivers of Health   Financial Resource Strain: Low Risk  (02/18/2018)   Overall Financial Resource Strain (CARDIA)    Difficulty of Paying Living Expenses: Not hard at all  Food Insecurity: No Food Insecurity (02/18/2018)   Hunger Vital Sign    Worried About Running Out of Food in the Last Year: Never true    Ran Out of Food in the Last Year:  Never true  Transportation Needs: No Transportation Needs (02/18/2018)   PRAPARE - Administrator, Civil Service (Medical): No    Lack of Transportation (Non-Medical): No  Physical Activity: Unknown (09/15/2019)   Exercise Vital Sign    Days of Exercise per Week: Not on file    Minutes of Exercise per Session: 0 min  Stress: No Stress Concern Present (02/18/2018)   [employer] of Occupational Health - Occupational Stress Questionnaire    Feeling of Stress : Not at all  Social Connections: Unknown (09/15/2019)   Social Connection and Isolation Panel [NHANES]  Frequency of Communication with Friends and Family: Not on file    Frequency of Social Gatherings with Friends and Family: Never    Attends Religious Services: Not on file    Active Member of Clubs or Organizations: Not on file    Attends Banker Meetings: Not on file    Marital Status: Not on file  Intimate Partner Violence: Not At Risk (02/18/2018)   Humiliation, Afraid, Rape, and Kick questionnaire    Fear of Current or Ex-Partner: No    Emotionally Abused: No    Physically Abused: No    Sexually Abused: No   Family History  Problem Relation Age of Onset   Cancer Mother    Colon cancer Neg Hx       VITAL SIGNS BP (!) 109/58   Pulse 73   Temp 98.3 F (36.8 C)   Resp 20   Ht 5\' 6"  (1.676 m)   Wt 208 lb 9.6 oz (94.6 kg)   SpO2 100%   BMI 33.67 kg/m   Outpatient Encounter Medications as of 10/13/2023  Medication Sig   aspirin EC 81 MG tablet Take 81 mg by mouth daily. 0900   Insulin Glargine (BASAGLAR KWIKPEN) 100 UNIT/ML Inject 8 Units into the skin at bedtime. 2200   Insulin Pen Needle (BD AUTOSHIELD DUO) 30G X 5 MM MISC by Does not apply route. 3/16"   levothyroxine (SYNTHROID) 125 MCG tablet Take 125 mcg by mouth at bedtime. 10 am, Special Instructions: Must be given (1) hour apart from food or other medications per pharmacy recommendations   midodrine (PROAMATINE) 10 MG tablet  Take 10 mg by mouth See admin instructions. Send med with resident on  Tues/Thurs/Sat to dialysis. do not give at [facility], give to resident to take with him. send with 5mg  to equal 15mg . 6:00   midodrine (PROAMATINE) 5 MG tablet Take 5 mg by mouth See admin instructions. Send with resident on dialysis days.  Tues/Thurs/Sat  Take along with 10 mg to equal 15 mg 6:00   NON FORMULARY Diet: Regular diet   Nutritional Supplements (,FEEDING SUPPLEMENT, PROSOURCE PLUS) liquid Take 30 mLs by mouth 2 (two) times daily between meals. 1600 and 2200   omega-3 acid ethyl esters (LOVAZA) 1 g capsule Take 2 g by mouth at bedtime. 2200   omeprazole (PRILOSEC) 40 MG capsule Take 40 mg by mouth at bedtime. 2200   polyethylene glycol (MIRALAX / GLYCOLAX) packet Take 17 g by mouth daily as needed for mild constipation.    rosuvastatin (CRESTOR) 20 MG tablet Take 20 mg by mouth daily.   sevelamer carbonate (RENVELA) 800 MG tablet Take 3 tablets (2,400 mg total) by mouth 3 (three) times daily with meals.   tamsulosin (FLOMAX) 0.4 MG CAPS capsule Take 0.4 mg by mouth at bedtime. Give 30 minutes after a meal. Do not crush or chew   torsemide (DEMADEX) 20 MG tablet Take 20 mg by mouth See admin instructions. 20mg  daily at 2000 on Sunday, Monday, Wednesday, and Friday only.   vitamin B-12 (CYANOCOBALAMIN) 1000 MCG tablet Take 1,000 mcg by mouth at bedtime.   No facility-administered encounter medications on file as of 10/13/2023.     SIGNIFICANT DIAGNOSTIC EXAMS   PREVIOUS   06-11-22: dexa: t score: -2.052  NO NEW EXAMS.    LABS REVIEWED PREVIOUS;      12 -4-23: wbc 4.3; hgb 12.9; hct 41.1; mcv 108.4 plt 108; glucose 112; bun 44; creat 16.87; k+ 4.6; na++ 139; ca 9.3  gfr 3; protein 7.0; albumin 3.5; hgb A1c 5.0; tsh 5.861 chol 89; ldl 41; trig 68; hdl 35 10-08-22: glucose 101; bun 22; creat 12.30; k+ 5.2; na++ 141; ion ca 1.14;  02-13-23: wbc 3.8; hgb 9.7; hct 28.7; mcv 101.1 plt 126; glucose 67; bun 18; creat  6.28; k+ 2.9; na++ 137; ca 8.8; gfr 9; protein 6.9; albumin 3.2 mag 1.8; occult blood: neg 02-23-23: hgb 9.8; hct 29.7 glucose 110; bun 24; creat 8.32 k+ 3.6; na++ 139; ca 9.6 gfr 6  06-11-23: wbc 4.4; hgb 11.7; hct 35.2; mcv 102.3 plt 88; glucose 160; bun 20; creat 4.71; k+ 3.5; na++ 137; ca 8.4; gfr 12   06-13-23: wbc 3.6; hgb 13.0; hct 39.2; mcv 102.6 plt 110; glucose 103; bun 11; creat 4.13; k+ 3.3; na++ 138; ca 8.6; gfr 14; d-dimer 0.54; CRP <0.5  06-16-23: glucose 99; bun 40; creat 10.88; k+ 4.6; na++ 136; ca 8.8; gfr 5 07-16-23: hgb A1c 5.7; chol 134; ldl 39; trig 486; hdl 24 09-03-23: tsh 3.852 free t4: 0.82 09-14-23: protein 6.9 albumin 3.2; chol 127; ldl 37 trig 420 hdl 25  NO NEW LABS.    Review of Systems  Constitutional:  Negative for malaise/fatigue.  Respiratory:  Negative for cough and shortness of breath.   Cardiovascular:  Negative for chest pain, palpitations and leg swelling.  Gastrointestinal:  Negative for abdominal pain, constipation and heartburn.  Musculoskeletal:  Negative for back pain, joint pain and myalgias.  Skin: Negative.   Neurological:  Negative for dizziness.  Psychiatric/Behavioral:  The patient is not nervous/anxious.    Physical Exam Constitutional:      General: He is not in acute distress.    Appearance: He is well-developed. He is obese. He is not diaphoretic.  Neck:     Thyroid: No thyromegaly.  Cardiovascular:     Rate and Rhythm: Normal rate and regular rhythm.     Pulses: Normal pulses.     Heart sounds: Normal heart sounds.  Pulmonary:     Effort: Pulmonary effort is normal. No respiratory distress.     Breath sounds: Normal breath sounds.  Abdominal:     General: Bowel sounds are normal. There is no distension.     Palpations: Abdomen is soft.     Tenderness: There is no abdominal tenderness.  Musculoskeletal:        General: Normal range of motion.     Cervical back: Neck supple.  Lymphadenopathy:     Cervical: No cervical  adenopathy.  Skin:    General: Skin is warm and dry.     Comments: Left arm fistula +thrill+bruit      Neurological:     Mental Status: He is alert. Mental status is at baseline.     Comments: 02-27-23: SLUMS 2/30       Psychiatric:        Mood and Affect: Mood normal.     ASSESSMENT/ PLAN:  TODAY  Vitamin B 12 deficiency: is on daily supplements  2. Secondary hyperparathyroidism of renal origin: will monitor   3. Bilateral lower extremity edema: will continue demedex 20 mg four days per week.   4. 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 Thrombocytopenia: plt 110  6. Aortic atherosclerosis (ct 12-30-21) is on asa and statin  7. End stage renal disease on hemodialysis due to type 2  diabetes mellitus/hemodialysis dependent : is on hemodialysis three days weekly; is no longer on fluid restriction as he is unable to adhere; renvela 2400 mg three times daily   8. Other specified hypothyroidism: tsh 3.852 will continue synthroid 125 mcg daily   9. 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 Major depression single episode moderate: is presently off medications.   11. Osteopenia: t score -2.052 is on supplements  12. GERD without esophagitis: is off PPI   13. History of colon cancer: s/p colectomy last seen by GI on 12-19-21; ct scan done on 12-30-21.  14. Dyslipidemia associated with type 2 diabetes mellitus: ldl 37 trig 420 will continue crestor  20 mg daily      Synthia Innocent NP [facility]  call [phone]

## 2023-10-15 DIAGNOSIS — N186 End stage renal disease: Secondary | ICD-10-CM | POA: Diagnosis not present

## 2023-10-15 DIAGNOSIS — N2581 Secondary hyperparathyroidism of renal origin: Secondary | ICD-10-CM | POA: Diagnosis not present

## 2023-10-15 DIAGNOSIS — Z992 Dependence on renal dialysis: Secondary | ICD-10-CM | POA: Diagnosis not present

## 2023-10-15 DIAGNOSIS — D509 Iron deficiency anemia, unspecified: Secondary | ICD-10-CM | POA: Diagnosis not present

## 2023-10-15 DIAGNOSIS — N250 Renal osteodystrophy: Secondary | ICD-10-CM | POA: Diagnosis not present

## 2023-10-15 DIAGNOSIS — D631 Anemia in chronic kidney disease: Secondary | ICD-10-CM | POA: Diagnosis not present

## 2023-10-17 DIAGNOSIS — D509 Iron deficiency anemia, unspecified: Secondary | ICD-10-CM | POA: Diagnosis not present

## 2023-10-17 DIAGNOSIS — N186 End stage renal disease: Secondary | ICD-10-CM | POA: Diagnosis not present

## 2023-10-17 DIAGNOSIS — N250 Renal osteodystrophy: Secondary | ICD-10-CM | POA: Diagnosis not present

## 2023-10-17 DIAGNOSIS — N2581 Secondary hyperparathyroidism of renal origin: Secondary | ICD-10-CM | POA: Diagnosis not present

## 2023-10-17 DIAGNOSIS — D631 Anemia in chronic kidney disease: Secondary | ICD-10-CM | POA: Diagnosis not present

## 2023-10-17 DIAGNOSIS — Z992 Dependence on renal dialysis: Secondary | ICD-10-CM | POA: Diagnosis not present

## 2023-10-19 DIAGNOSIS — D631 Anemia in chronic kidney disease: Secondary | ICD-10-CM | POA: Diagnosis not present

## 2023-10-19 DIAGNOSIS — Z992 Dependence on renal dialysis: Secondary | ICD-10-CM | POA: Diagnosis not present

## 2023-10-19 DIAGNOSIS — N2581 Secondary hyperparathyroidism of renal origin: Secondary | ICD-10-CM | POA: Diagnosis not present

## 2023-10-19 DIAGNOSIS — D509 Iron deficiency anemia, unspecified: Secondary | ICD-10-CM | POA: Diagnosis not present

## 2023-10-19 DIAGNOSIS — N186 End stage renal disease: Secondary | ICD-10-CM | POA: Diagnosis not present

## 2023-10-19 DIAGNOSIS — N250 Renal osteodystrophy: Secondary | ICD-10-CM | POA: Diagnosis not present

## 2023-10-22 DIAGNOSIS — D509 Iron deficiency anemia, unspecified: Secondary | ICD-10-CM | POA: Diagnosis not present

## 2023-10-22 DIAGNOSIS — N250 Renal osteodystrophy: Secondary | ICD-10-CM | POA: Diagnosis not present

## 2023-10-22 DIAGNOSIS — Z992 Dependence on renal dialysis: Secondary | ICD-10-CM | POA: Diagnosis not present

## 2023-10-22 DIAGNOSIS — N2581 Secondary hyperparathyroidism of renal origin: Secondary | ICD-10-CM | POA: Diagnosis not present

## 2023-10-22 DIAGNOSIS — N186 End stage renal disease: Secondary | ICD-10-CM | POA: Diagnosis not present

## 2023-10-22 DIAGNOSIS — D631 Anemia in chronic kidney disease: Secondary | ICD-10-CM | POA: Diagnosis not present

## 2023-10-24 DIAGNOSIS — N250 Renal osteodystrophy: Secondary | ICD-10-CM | POA: Diagnosis not present

## 2023-10-24 DIAGNOSIS — N186 End stage renal disease: Secondary | ICD-10-CM | POA: Diagnosis not present

## 2023-10-24 DIAGNOSIS — D509 Iron deficiency anemia, unspecified: Secondary | ICD-10-CM | POA: Diagnosis not present

## 2023-10-24 DIAGNOSIS — N2581 Secondary hyperparathyroidism of renal origin: Secondary | ICD-10-CM | POA: Diagnosis not present

## 2023-10-24 DIAGNOSIS — Z992 Dependence on renal dialysis: Secondary | ICD-10-CM | POA: Diagnosis not present

## 2023-10-24 DIAGNOSIS — D631 Anemia in chronic kidney disease: Secondary | ICD-10-CM | POA: Diagnosis not present

## 2023-10-27 DIAGNOSIS — D631 Anemia in chronic kidney disease: Secondary | ICD-10-CM | POA: Diagnosis not present

## 2023-10-27 DIAGNOSIS — N2581 Secondary hyperparathyroidism of renal origin: Secondary | ICD-10-CM | POA: Diagnosis not present

## 2023-10-27 DIAGNOSIS — N250 Renal osteodystrophy: Secondary | ICD-10-CM | POA: Diagnosis not present

## 2023-10-27 DIAGNOSIS — N186 End stage renal disease: Secondary | ICD-10-CM | POA: Diagnosis not present

## 2023-10-27 DIAGNOSIS — Z992 Dependence on renal dialysis: Secondary | ICD-10-CM | POA: Diagnosis not present

## 2023-10-27 DIAGNOSIS — D509 Iron deficiency anemia, unspecified: Secondary | ICD-10-CM | POA: Diagnosis not present

## 2023-10-29 DIAGNOSIS — N250 Renal osteodystrophy: Secondary | ICD-10-CM | POA: Diagnosis not present

## 2023-10-29 DIAGNOSIS — N186 End stage renal disease: Secondary | ICD-10-CM | POA: Diagnosis not present

## 2023-10-29 DIAGNOSIS — Z992 Dependence on renal dialysis: Secondary | ICD-10-CM | POA: Diagnosis not present

## 2023-10-29 DIAGNOSIS — N2581 Secondary hyperparathyroidism of renal origin: Secondary | ICD-10-CM | POA: Diagnosis not present

## 2023-10-29 DIAGNOSIS — D509 Iron deficiency anemia, unspecified: Secondary | ICD-10-CM | POA: Diagnosis not present

## 2023-10-29 DIAGNOSIS — D631 Anemia in chronic kidney disease: Secondary | ICD-10-CM | POA: Diagnosis not present

## 2023-10-31 DIAGNOSIS — D509 Iron deficiency anemia, unspecified: Secondary | ICD-10-CM | POA: Diagnosis not present

## 2023-10-31 DIAGNOSIS — D631 Anemia in chronic kidney disease: Secondary | ICD-10-CM | POA: Diagnosis not present

## 2023-10-31 DIAGNOSIS — N250 Renal osteodystrophy: Secondary | ICD-10-CM | POA: Diagnosis not present

## 2023-10-31 DIAGNOSIS — N186 End stage renal disease: Secondary | ICD-10-CM | POA: Diagnosis not present

## 2023-10-31 DIAGNOSIS — Z992 Dependence on renal dialysis: Secondary | ICD-10-CM | POA: Diagnosis not present

## 2023-10-31 DIAGNOSIS — N2581 Secondary hyperparathyroidism of renal origin: Secondary | ICD-10-CM | POA: Diagnosis not present

## 2023-11-03 DIAGNOSIS — D631 Anemia in chronic kidney disease: Secondary | ICD-10-CM | POA: Diagnosis not present

## 2023-11-03 DIAGNOSIS — D509 Iron deficiency anemia, unspecified: Secondary | ICD-10-CM | POA: Diagnosis not present

## 2023-11-03 DIAGNOSIS — N186 End stage renal disease: Secondary | ICD-10-CM | POA: Diagnosis not present

## 2023-11-03 DIAGNOSIS — N2581 Secondary hyperparathyroidism of renal origin: Secondary | ICD-10-CM | POA: Diagnosis not present

## 2023-11-03 DIAGNOSIS — N250 Renal osteodystrophy: Secondary | ICD-10-CM | POA: Diagnosis not present

## 2023-11-03 DIAGNOSIS — Z992 Dependence on renal dialysis: Secondary | ICD-10-CM | POA: Diagnosis not present

## 2023-11-04 ENCOUNTER — Non-Acute Institutional Stay (SKILLED_NURSING_FACILITY): Payer: Medicare Other | Admitting: Internal Medicine

## 2023-11-04 ENCOUNTER — Encounter: Payer: Self-pay | Admitting: Internal Medicine

## 2023-11-04 DIAGNOSIS — I120 Hypertensive chronic kidney disease with stage 5 chronic kidney disease or end stage renal disease: Secondary | ICD-10-CM

## 2023-11-04 DIAGNOSIS — E781 Pure hyperglyceridemia: Secondary | ICD-10-CM | POA: Diagnosis not present

## 2023-11-04 DIAGNOSIS — E034 Atrophy of thyroid (acquired): Secondary | ICD-10-CM

## 2023-11-04 DIAGNOSIS — N186 End stage renal disease: Secondary | ICD-10-CM | POA: Diagnosis not present

## 2023-11-04 DIAGNOSIS — D696 Thrombocytopenia, unspecified: Secondary | ICD-10-CM

## 2023-11-04 DIAGNOSIS — E1122 Type 2 diabetes mellitus with diabetic chronic kidney disease: Secondary | ICD-10-CM | POA: Diagnosis not present

## 2023-11-04 DIAGNOSIS — D631 Anemia in chronic kidney disease: Secondary | ICD-10-CM

## 2023-11-04 DIAGNOSIS — Z992 Dependence on renal dialysis: Secondary | ICD-10-CM | POA: Diagnosis not present

## 2023-11-04 NOTE — Patient Instructions (Signed)
 See assessment and plan under each diagnosis in the problem list and acutely for this visit

## 2023-11-04 NOTE — Assessment & Plan Note (Addendum)
 Thrombocytopenia persists with current platelet count of 110,000.  No bleeding dyscrasias reported by staff.  Continue to monitor.

## 2023-11-04 NOTE — Assessment & Plan Note (Addendum)
 On 06/13/2023 H/H values were 13/39.2 indicating resolution of the anemia.  Persistent macrocytosis with an MCV of 102.6.  The last B12 level on record was low normal at 272 on 11/22/2021.Update B12 level.

## 2023-11-04 NOTE — Assessment & Plan Note (Addendum)
 Triglycerides have improved slightly with current value of 420.  Total cholesterol is 127 with an HDL of 25.  LDL cannot be calculated. Avoid HFCS containing foods &drinks.

## 2023-11-04 NOTE — Progress Notes (Signed)
 NURSING HOME LOCATION:  [facility] ROOM NUMBER:  112 D CODE STATUS:  Full Code  PCP:  Barnie Seip NP  This is a nursing facility follow up visit of chronic medical diagnoses & to document compliance with Regulation 483.30 (c) in The Long Term Care Survey Manual Phase 2 which mandates caregiver visit ( visits can alternate among physician, PA or NP as per statutes) within 10 days of 30 days / 60 days/ 90 days post admission to SNF date    Interim medical record and care since last SNF visit was updated with review of diagnostic studies and change in clinical status since last visit were documented.  HPI: He is a permanent resident of this facility with medical diagnoses of end-stage renal disease on hemodialysis, chronic anemia, BPH, history of DVT, GERD, dyslipidemia, hypothyroidism, mental retardation, protein/caloric malnutrition, and diabetes with vascular complications.   Labs are checked at hemodialysis Tuesday, Thursday, and Saturday.  Current labs performed at the SNF including albumin  of 3.2 and total protein 6.9.  Lipids revealed total cholesterol 127 with HDL of 25 and triglycerides of 420.  LDL could not be calculated.  TSH is therapeutic at 3.852.  The most recent A1c was 5.7% on 07/16/2023. Most recent hemoglobin/hematocrit was 06/13/2023 with values of 13/39.2.  Despite resolution of the anemia; macrocytosis persisted with an MCV of 102.6.  Last B12 on record was 272, low normal, on 11/22/2021.  Review of systems: All queries were monosyllabic no. Despite hemodialysis he states he does make urine.  He denies any GU symptoms.  Despite the thrombocytopenia; he has no bleeding dyscrasias.  He has no diabetic symptoms.  Constitutional: No fever, significant weight change, fatigue  Eyes: No redness, discharge, pain, vision change ENT/mouth: No nasal congestion,  purulent discharge, earache, change in hearing, sore throat  Cardiovascular: No chest pain,  palpitations, paroxysmal nocturnal dyspnea, claudication, edema  Respiratory: No cough, sputum production, hemoptysis, DOE, significant snoring, apnea   Gastrointestinal: No heartburn, dysphagia, abdominal pain, nausea /vomiting, rectal bleeding, melena, change in bowels Genitourinary: No dysuria, hematuria, pyuria, incontinence, nocturia Musculoskeletal: No joint stiffness, joint swelling, weakness, pain Dermatologic: No rash, pruritus, change in appearance of skin Neurologic: No dizziness, headache, syncope, seizures, numbness, tingling Psychiatric: No significant anxiety, depression, insomnia, anorexia Endocrine: No change in hair/skin/nails, excessive thirst, excessive hunger, excessive urination  Hematologic/lymphatic: No significant bruising, lymphadenopathy, abnormal bleeding Allergy/immunology: No itchy/watery eyes, significant sneezing, urticaria, angioedema  Physical exam:  Pertinent or positive findings: He mobilizes in the wheelchair using his upper extremities.  There is slight exotropia of the right eye.  Arcus senilis is present.  The sclera are muddy.  He is missing the anterior maxillary teeth.  There is thick plaque diffusely intraorally.  Abdomen is protuberant.  Pedal pulses are decreased.  There is trace edema at the sock line.  The right thumb is hyperpigmented.  General appearance: Adequately nourished; no acute distress, increased work of breathing is present.   Lymphatic: No lymphadenopathy about the head, neck, axilla. Eyes: No conjunctival inflammation or lid edema is present. There is no scleral icterus. Ears:  External ear exam shows no significant lesions or deformities.   Nose:  External nasal examination shows no deformity or inflammation. Nasal mucosa are pink and moist without lesions, exudates Neck:  No thyromegaly, masses, tenderness noted.    Heart:  Normal rate and regular rhythm. S1 and S2 normal without gallop, murmur, click, rub .  Lungs: Chest clear to  auscultation without wheezes,  rhonchi, rales, rubs. Abdomen: Bowel sounds are normal. Abdomen is soft and nontender with no organomegaly, hernias, masses. GU: Deferred  Extremities:  No cyanosis, clubbing  Neurologic exam :Balance, Rhomberg, finger to nose testing could not be completed due to clinical state Skin: Warm & dry w/o tenting. No significant rash.  See summary under each active problem in the Problem List with associated updated therapeutic plan

## 2023-11-04 NOTE — Assessment & Plan Note (Addendum)
 Most recent A1c was 5.7% on 07/16/2023 suggesting excellent control; however, with ESRD discordance is possible.  Renal function is checked T,Th, & Sat @ HD.

## 2023-11-04 NOTE — Assessment & Plan Note (Signed)
 TSH is current and therapeutic with a value of 3.852.  No change in L-thyroxine dose indicated.  Periodic TSH monitor to continue.

## 2023-11-05 DIAGNOSIS — D631 Anemia in chronic kidney disease: Secondary | ICD-10-CM | POA: Diagnosis not present

## 2023-11-05 DIAGNOSIS — N250 Renal osteodystrophy: Secondary | ICD-10-CM | POA: Diagnosis not present

## 2023-11-05 DIAGNOSIS — D509 Iron deficiency anemia, unspecified: Secondary | ICD-10-CM | POA: Diagnosis not present

## 2023-11-05 DIAGNOSIS — N186 End stage renal disease: Secondary | ICD-10-CM | POA: Diagnosis not present

## 2023-11-05 DIAGNOSIS — Z992 Dependence on renal dialysis: Secondary | ICD-10-CM | POA: Diagnosis not present

## 2023-11-05 DIAGNOSIS — N2581 Secondary hyperparathyroidism of renal origin: Secondary | ICD-10-CM | POA: Diagnosis not present

## 2023-11-06 DIAGNOSIS — N250 Renal osteodystrophy: Secondary | ICD-10-CM | POA: Diagnosis not present

## 2023-11-06 DIAGNOSIS — N186 End stage renal disease: Secondary | ICD-10-CM | POA: Diagnosis not present

## 2023-11-06 DIAGNOSIS — Z992 Dependence on renal dialysis: Secondary | ICD-10-CM | POA: Diagnosis not present

## 2023-11-06 DIAGNOSIS — N2581 Secondary hyperparathyroidism of renal origin: Secondary | ICD-10-CM | POA: Diagnosis not present

## 2023-11-06 DIAGNOSIS — D509 Iron deficiency anemia, unspecified: Secondary | ICD-10-CM | POA: Diagnosis not present

## 2023-11-06 DIAGNOSIS — D631 Anemia in chronic kidney disease: Secondary | ICD-10-CM | POA: Diagnosis not present

## 2023-11-09 DIAGNOSIS — F4322 Adjustment disorder with anxiety: Secondary | ICD-10-CM | POA: Diagnosis not present

## 2023-11-09 DIAGNOSIS — F70 Mild intellectual disabilities: Secondary | ICD-10-CM | POA: Diagnosis not present

## 2023-11-10 DIAGNOSIS — D509 Iron deficiency anemia, unspecified: Secondary | ICD-10-CM | POA: Diagnosis not present

## 2023-11-10 DIAGNOSIS — N186 End stage renal disease: Secondary | ICD-10-CM | POA: Diagnosis not present

## 2023-11-10 DIAGNOSIS — Z992 Dependence on renal dialysis: Secondary | ICD-10-CM | POA: Diagnosis not present

## 2023-11-10 DIAGNOSIS — D631 Anemia in chronic kidney disease: Secondary | ICD-10-CM | POA: Diagnosis not present

## 2023-11-10 DIAGNOSIS — N2581 Secondary hyperparathyroidism of renal origin: Secondary | ICD-10-CM | POA: Diagnosis not present

## 2023-11-10 DIAGNOSIS — N250 Renal osteodystrophy: Secondary | ICD-10-CM | POA: Diagnosis not present

## 2023-11-11 ENCOUNTER — Encounter: Payer: Self-pay | Admitting: Internal Medicine

## 2023-11-12 DIAGNOSIS — D631 Anemia in chronic kidney disease: Secondary | ICD-10-CM | POA: Diagnosis not present

## 2023-11-12 DIAGNOSIS — N186 End stage renal disease: Secondary | ICD-10-CM | POA: Diagnosis not present

## 2023-11-12 DIAGNOSIS — N250 Renal osteodystrophy: Secondary | ICD-10-CM | POA: Diagnosis not present

## 2023-11-12 DIAGNOSIS — D509 Iron deficiency anemia, unspecified: Secondary | ICD-10-CM | POA: Diagnosis not present

## 2023-11-12 DIAGNOSIS — Z992 Dependence on renal dialysis: Secondary | ICD-10-CM | POA: Diagnosis not present

## 2023-11-12 DIAGNOSIS — N2581 Secondary hyperparathyroidism of renal origin: Secondary | ICD-10-CM | POA: Diagnosis not present

## 2023-11-14 DIAGNOSIS — Z992 Dependence on renal dialysis: Secondary | ICD-10-CM | POA: Diagnosis not present

## 2023-11-14 DIAGNOSIS — D509 Iron deficiency anemia, unspecified: Secondary | ICD-10-CM | POA: Diagnosis not present

## 2023-11-14 DIAGNOSIS — N186 End stage renal disease: Secondary | ICD-10-CM | POA: Diagnosis not present

## 2023-11-14 DIAGNOSIS — N250 Renal osteodystrophy: Secondary | ICD-10-CM | POA: Diagnosis not present

## 2023-11-14 DIAGNOSIS — N2581 Secondary hyperparathyroidism of renal origin: Secondary | ICD-10-CM | POA: Diagnosis not present

## 2023-11-14 DIAGNOSIS — D631 Anemia in chronic kidney disease: Secondary | ICD-10-CM | POA: Diagnosis not present

## 2023-11-17 DIAGNOSIS — D631 Anemia in chronic kidney disease: Secondary | ICD-10-CM | POA: Diagnosis not present

## 2023-11-17 DIAGNOSIS — N186 End stage renal disease: Secondary | ICD-10-CM | POA: Diagnosis not present

## 2023-11-17 DIAGNOSIS — D509 Iron deficiency anemia, unspecified: Secondary | ICD-10-CM | POA: Diagnosis not present

## 2023-11-17 DIAGNOSIS — N2581 Secondary hyperparathyroidism of renal origin: Secondary | ICD-10-CM | POA: Diagnosis not present

## 2023-11-17 DIAGNOSIS — N250 Renal osteodystrophy: Secondary | ICD-10-CM | POA: Diagnosis not present

## 2023-11-17 DIAGNOSIS — Z992 Dependence on renal dialysis: Secondary | ICD-10-CM | POA: Diagnosis not present

## 2023-11-19 DIAGNOSIS — D509 Iron deficiency anemia, unspecified: Secondary | ICD-10-CM | POA: Diagnosis not present

## 2023-11-19 DIAGNOSIS — D631 Anemia in chronic kidney disease: Secondary | ICD-10-CM | POA: Diagnosis not present

## 2023-11-19 DIAGNOSIS — Z992 Dependence on renal dialysis: Secondary | ICD-10-CM | POA: Diagnosis not present

## 2023-11-19 DIAGNOSIS — N2581 Secondary hyperparathyroidism of renal origin: Secondary | ICD-10-CM | POA: Diagnosis not present

## 2023-11-19 DIAGNOSIS — N250 Renal osteodystrophy: Secondary | ICD-10-CM | POA: Diagnosis not present

## 2023-11-19 DIAGNOSIS — N186 End stage renal disease: Secondary | ICD-10-CM | POA: Diagnosis not present

## 2023-11-21 DIAGNOSIS — N2581 Secondary hyperparathyroidism of renal origin: Secondary | ICD-10-CM | POA: Diagnosis not present

## 2023-11-21 DIAGNOSIS — D509 Iron deficiency anemia, unspecified: Secondary | ICD-10-CM | POA: Diagnosis not present

## 2023-11-21 DIAGNOSIS — N250 Renal osteodystrophy: Secondary | ICD-10-CM | POA: Diagnosis not present

## 2023-11-21 DIAGNOSIS — N186 End stage renal disease: Secondary | ICD-10-CM | POA: Diagnosis not present

## 2023-11-21 DIAGNOSIS — D631 Anemia in chronic kidney disease: Secondary | ICD-10-CM | POA: Diagnosis not present

## 2023-11-21 DIAGNOSIS — Z992 Dependence on renal dialysis: Secondary | ICD-10-CM | POA: Diagnosis not present

## 2023-11-24 DIAGNOSIS — D509 Iron deficiency anemia, unspecified: Secondary | ICD-10-CM | POA: Diagnosis not present

## 2023-11-24 DIAGNOSIS — N186 End stage renal disease: Secondary | ICD-10-CM | POA: Diagnosis not present

## 2023-11-24 DIAGNOSIS — N250 Renal osteodystrophy: Secondary | ICD-10-CM | POA: Diagnosis not present

## 2023-11-24 DIAGNOSIS — Z992 Dependence on renal dialysis: Secondary | ICD-10-CM | POA: Diagnosis not present

## 2023-11-24 DIAGNOSIS — D631 Anemia in chronic kidney disease: Secondary | ICD-10-CM | POA: Diagnosis not present

## 2023-11-24 DIAGNOSIS — N2581 Secondary hyperparathyroidism of renal origin: Secondary | ICD-10-CM | POA: Diagnosis not present

## 2023-11-26 ENCOUNTER — Encounter: Payer: Self-pay | Admitting: *Deleted

## 2023-11-26 ENCOUNTER — Telehealth: Payer: Self-pay | Admitting: *Deleted

## 2023-11-26 ENCOUNTER — Other Ambulatory Visit: Payer: Self-pay | Admitting: *Deleted

## 2023-11-26 DIAGNOSIS — D631 Anemia in chronic kidney disease: Secondary | ICD-10-CM | POA: Diagnosis not present

## 2023-11-26 DIAGNOSIS — N2581 Secondary hyperparathyroidism of renal origin: Secondary | ICD-10-CM | POA: Diagnosis not present

## 2023-11-26 DIAGNOSIS — Z992 Dependence on renal dialysis: Secondary | ICD-10-CM | POA: Diagnosis not present

## 2023-11-26 DIAGNOSIS — D509 Iron deficiency anemia, unspecified: Secondary | ICD-10-CM | POA: Diagnosis not present

## 2023-11-26 DIAGNOSIS — K869 Disease of pancreas, unspecified: Secondary | ICD-10-CM

## 2023-11-26 DIAGNOSIS — N250 Renal osteodystrophy: Secondary | ICD-10-CM | POA: Diagnosis not present

## 2023-11-26 DIAGNOSIS — N186 End stage renal disease: Secondary | ICD-10-CM | POA: Diagnosis not present

## 2023-11-26 NOTE — Telephone Encounter (Signed)
Mavis from [facility] called and left message stating pt needed CT with pancreatic protocol.  Pt can't do T,Th or Sat due to dialysis.  Called and spoke with Gweneth Dimitri and she said Victorino Dike from [facility] called and told her to have pt there tomorrow at 5:30 pm

## 2023-11-27 ENCOUNTER — Ambulatory Visit (HOSPITAL_COMMUNITY): Admission: RE | Admit: 2023-11-27 | Payer: Medicare Other | Source: Ambulatory Visit

## 2023-11-27 DIAGNOSIS — D509 Iron deficiency anemia, unspecified: Secondary | ICD-10-CM | POA: Diagnosis not present

## 2023-11-27 DIAGNOSIS — N250 Renal osteodystrophy: Secondary | ICD-10-CM | POA: Diagnosis not present

## 2023-11-27 DIAGNOSIS — Z992 Dependence on renal dialysis: Secondary | ICD-10-CM | POA: Diagnosis not present

## 2023-11-27 DIAGNOSIS — D631 Anemia in chronic kidney disease: Secondary | ICD-10-CM | POA: Diagnosis not present

## 2023-11-27 DIAGNOSIS — N186 End stage renal disease: Secondary | ICD-10-CM | POA: Diagnosis not present

## 2023-11-27 DIAGNOSIS — N2581 Secondary hyperparathyroidism of renal origin: Secondary | ICD-10-CM | POA: Diagnosis not present

## 2023-11-28 DIAGNOSIS — D631 Anemia in chronic kidney disease: Secondary | ICD-10-CM | POA: Diagnosis not present

## 2023-11-28 DIAGNOSIS — N2581 Secondary hyperparathyroidism of renal origin: Secondary | ICD-10-CM | POA: Diagnosis not present

## 2023-11-28 DIAGNOSIS — N250 Renal osteodystrophy: Secondary | ICD-10-CM | POA: Diagnosis not present

## 2023-11-28 DIAGNOSIS — D509 Iron deficiency anemia, unspecified: Secondary | ICD-10-CM | POA: Diagnosis not present

## 2023-11-28 DIAGNOSIS — N186 End stage renal disease: Secondary | ICD-10-CM | POA: Diagnosis not present

## 2023-11-28 DIAGNOSIS — Z992 Dependence on renal dialysis: Secondary | ICD-10-CM | POA: Diagnosis not present

## 2023-12-01 DIAGNOSIS — D631 Anemia in chronic kidney disease: Secondary | ICD-10-CM | POA: Diagnosis not present

## 2023-12-01 DIAGNOSIS — N250 Renal osteodystrophy: Secondary | ICD-10-CM | POA: Diagnosis not present

## 2023-12-01 DIAGNOSIS — N186 End stage renal disease: Secondary | ICD-10-CM | POA: Diagnosis not present

## 2023-12-01 DIAGNOSIS — D509 Iron deficiency anemia, unspecified: Secondary | ICD-10-CM | POA: Diagnosis not present

## 2023-12-01 DIAGNOSIS — N2581 Secondary hyperparathyroidism of renal origin: Secondary | ICD-10-CM | POA: Diagnosis not present

## 2023-12-01 DIAGNOSIS — Z992 Dependence on renal dialysis: Secondary | ICD-10-CM | POA: Diagnosis not present

## 2023-12-03 DIAGNOSIS — D509 Iron deficiency anemia, unspecified: Secondary | ICD-10-CM | POA: Diagnosis not present

## 2023-12-03 DIAGNOSIS — N2581 Secondary hyperparathyroidism of renal origin: Secondary | ICD-10-CM | POA: Diagnosis not present

## 2023-12-03 DIAGNOSIS — N186 End stage renal disease: Secondary | ICD-10-CM | POA: Diagnosis not present

## 2023-12-03 DIAGNOSIS — N250 Renal osteodystrophy: Secondary | ICD-10-CM | POA: Diagnosis not present

## 2023-12-03 DIAGNOSIS — D631 Anemia in chronic kidney disease: Secondary | ICD-10-CM | POA: Diagnosis not present

## 2023-12-03 DIAGNOSIS — Z992 Dependence on renal dialysis: Secondary | ICD-10-CM | POA: Diagnosis not present

## 2023-12-04 ENCOUNTER — Encounter: Payer: Self-pay | Admitting: Adult Health

## 2023-12-04 ENCOUNTER — Non-Acute Institutional Stay (SKILLED_NURSING_FACILITY): Payer: Medicare Other | Admitting: Adult Health

## 2023-12-04 ENCOUNTER — Ambulatory Visit (HOSPITAL_COMMUNITY)
Admission: RE | Admit: 2023-12-04 | Discharge: 2023-12-04 | Disposition: A | Discharge status: Home / Self Care | Payer: Medicare Other | Source: Ambulatory Visit | Attending: Internal Medicine | Admitting: Internal Medicine

## 2023-12-04 DIAGNOSIS — Z992 Dependence on renal dialysis: Secondary | ICD-10-CM | POA: Diagnosis not present

## 2023-12-04 DIAGNOSIS — I700 Atherosclerosis of aorta: Secondary | ICD-10-CM | POA: Diagnosis not present

## 2023-12-04 DIAGNOSIS — Z794 Long term (current) use of insulin: Secondary | ICD-10-CM | POA: Diagnosis not present

## 2023-12-04 DIAGNOSIS — D696 Thrombocytopenia, unspecified: Secondary | ICD-10-CM

## 2023-12-04 DIAGNOSIS — K869 Disease of pancreas, unspecified: Secondary | ICD-10-CM | POA: Insufficient documentation

## 2023-12-04 DIAGNOSIS — N200 Calculus of kidney: Secondary | ICD-10-CM | POA: Diagnosis not present

## 2023-12-04 DIAGNOSIS — K429 Umbilical hernia without obstruction or gangrene: Secondary | ICD-10-CM | POA: Diagnosis not present

## 2023-12-04 MED ORDER — IOHEXOL 300 MG/ML  SOLN
100.0000 mL | Freq: Once | INTRAMUSCULAR | Status: AC | PRN
  Administered 2023-12-04: 100 mL via INTRAVENOUS

## 2023-12-04 NOTE — Progress Notes (Signed)
 Location:  [facility] Room Number: 113 Place of Service:  SNF (31)   CODE STATUS: full   No Known Allergies  Chief Complaint  Patient presents with   Acute Visit    Care plan meeting     HPI:  We have come together for his care plan meeting. BIMS 6/15 mood 0/30. He is nonambulatory with no falls. He is independent to supervision with his adl care. Is occasional incontinent of bladder and continent of bowel. Dietary: regular diet; feeds self appetite 76-100% of meals weight is 212 pounds. Therapy: none at this time. He will continue to be followed for his chronic illnesses including: Dependence on renal dialysis    Long term insulin  use     Thrombocytopenia     Aortic atherosclerosis  Past Medical History:  Diagnosis Date   Abnormal CT scan, kidney 10/06/2011   Acute pyelonephritis 10/07/2011   Anemia    normocytic   Anxiety    mental retardation   Bladder wall thickening 10/06/2011   BPH (benign prostatic hypertrophy)    Diabetes mellitus    Dialysis patient (HCC)    Tuesday, Thursday and Saturday,    DVT of leg (deep venous thrombosis) (HCC) 12/25/2016   Edema     history of lower extremity edema   GERD (gastroesophageal reflux disease)    Heme positive stool    Hydronephrosis    Hyperkalemia    Hyperlipidemia    Hypernatremia    Hypertension    Hypothyroidism    Impaired speech    Infected prosthetic vascular graft (HCC)    MR (mental retardation)    Muscle weakness    Obstructive uropathy    Perinephric abscess 10/07/2011   Poor historian poor historian   Primary colorectal adenocarcinoma (HCC) 04/2017   S/p total colectomy   Protein calorie malnutrition (HCC)    Pyelonephritis    Renal failure (ARF), acute on chronic (HCC)    Renal insufficiency    chronic history   Sepsis (HCC)    Smoking    Uremia    Urinary retention    UTI (lower urinary tract infection) 10/06/2011    Past Surgical History:  Procedure Laterality Date    A/V FISTULAGRAM N/A 08/13/2018   Procedure: A/V FISTULAGRAM - Right Upper;  Surgeon: Harvey Carlin BRAVO, MD;  Location: [facility] INVASIVE CV LAB;  Service: Cardiovascular;  Laterality: N/A;   A/V FISTULAGRAM N/A 11/22/2018   Procedure: A/V FISTULAGRAM - Right Upper;  Surgeon: Sheree Penne Bruckner, MD;  Location: [facility] INVASIVE CV LAB;  Service: Cardiovascular;  Laterality: N/A;   A/V FISTULAGRAM Left 10/08/2022   Procedure: A/V Fistulagram;  Surgeon: Lanis Fonda BRAVO, MD;  Location: [facility] INVASIVE CV LAB;  Service: Cardiovascular;  Laterality: Left;   A/V FISTULAGRAM Left 05/06/2023   Procedure: A/V Fistulagram;  Surgeon: Magda Debby SAILOR, MD;  Location: [facility] INVASIVE CV LAB;  Service: Cardiovascular;  Laterality: Left;   AV FISTULA PLACEMENT Left 07/06/2015   Procedure:  INSERTION LEFT ARM ARTERIOVENOUS GORTEX GRAFT;  Surgeon: Bruckner GORMAN Blade, MD;  Location: [facility] OR;  Service: Vascular;  Laterality: Left;   AV FISTULA PLACEMENT Right 02/26/2016   Procedure: ARTERIOVENOUS (AV) FISTULA CREATION ;  Surgeon: Bruckner GORMAN Blade, MD;  Location: [facility] OR;  Service: Vascular;  Laterality: Right;   AV FISTULA PLACEMENT Right 11/25/2018   Procedure: INSERTION OF ARTERIOVENOUS (AV) ARTEGRAFT RIGHT UPPER ARM;  Surgeon: Sheree Penne Bruckner, MD;  Location: [facility] OR;  Service: Vascular;  Laterality:  Right;   AV FISTULA PLACEMENT Left 05/28/2021   Procedure: LEFT ARM ARTERIOVENOUS (AV) FISTULA;  Surgeon: Oris Krystal FALCON, MD;  Location: [facility] ORS;  Service: Vascular;  Laterality: Left;   AVGG REMOVAL Left 10/09/2015   Procedure: REMOVAL OF ARTERIOVENOUS GORETEX GRAFT (AVGG) Evacuation of Lymphocele, Vein Patch angioplasty of brachial artery.;  Surgeon: Lonni GORMAN Blade, MD;  Location: [facility] OR;  Service: Vascular;  Laterality: Left;   BASCILIC VEIN TRANSPOSITION Right 02/26/2016   Procedure: Right BASCILIC VEIN TRANSPOSITION;  Surgeon: Lonni GORMAN Blade, MD;  Location: [facility] OR;  Service: Vascular;  Laterality:  Right;   BASCILIC VEIN TRANSPOSITION Left 07/30/2021   Procedure: LEFT ARM SECOND STAGE BASILIC VEIN TRANSPOSITION;  Surgeon: Oris Krystal FALCON, MD;  Location: [facility] ORS;  Service: Vascular;  Laterality: Left;   CIRCUMCISION N/A 01/04/2014   Procedure: CIRCUMCISION ADULT (procedure #1);  Surgeon: Mohammad I Javaid, MD;  Location: [facility] ORS;  Service: Urology;  Laterality: N/A;   COLECTOMY N/A 05/04/2017   Procedure: TOTAL COLECTOMY;  Surgeon: Mavis Anes, MD;  Location: [facility] ORS;  Service: General;  Laterality: N/A;   COLONOSCOPY N/A 04/27/2017   Surgeon: Shaaron Lamar HERO, MD; annular mass in the ascending colon likely representing cancer biopsied, multiple 6-22 mm polyps removed, clean rectum.  Pathology with multiple tubular adenomas, high-grade dysplasia noted in ascending colon and splenic flexure.   CYSTOSCOPY W/ RETROGRADES Bilateral 06/29/2015   Procedure: CYSTOSCOPY, DILATION OF URETHRAL STRICTURE WITH BILATERAL RETROGRADE PYELOGRAM,SUPRAPUBIC TUBE CHANGE;  Surgeon: Donnice Brooks, MD;  Location: [facility] ORS;  Service: Urology;  Laterality: Bilateral;   CYSTOSCOPY WITH URETHRAL DILATATION N/A 12/29/2013   Procedure: CYSTOSCOPY WITH URETHRAL DILATATION;  Surgeon: Emery LILLETTE Blaze, MD;  Location: [facility] ORS;  Service: Urology;  Laterality: N/A;   ESOPHAGOGASTRODUODENOSCOPY N/A 04/27/2017   Procedure: ESOPHAGOGASTRODUODENOSCOPY (EGD);  Surgeon: Shaaron Lamar HERO, MD;  Location: [facility];  Service: Endoscopy;  Laterality: N/A;   FLEXIBLE SIGMOIDOSCOPY N/A 12/02/2021   Procedure: FLEXIBLE SIGMOIDOSCOPY;  Surgeon: Cindie Carlin POUR, DO;  Location: [facility];  Service: Endoscopy;  Laterality: N/A;  10:30am, dialysis patient   INSERTION OF DIALYSIS CATHETER Right 11/25/2018   Procedure: INSERTION OF DIALYSIS CATHETER RIGHT INTERNAL JUGULAR;  Surgeon: Sheree Penne Lonni, MD;  Location: [facility] OR;  Service: Vascular;  Laterality: Right;   IR AV DIALY SHUNT INTRO NEEDLE/INTRACATH INITIAL W/PTA/IMG RIGHT Right  09/07/2018   IR FLUORO GUIDE CV LINE RIGHT  10/16/2020   IR FLUORO GUIDE CV LINE RIGHT  10/03/2022   IR REMOVAL TUN CV CATH W/O FL  01/12/2019   IR THROMBECTOMY AV FISTULA W/THROMBOLYSIS/PTA INC/SHUNT/IMG RIGHT Right 04/26/2018   IR US  GUIDE VASC ACCESS RIGHT  04/26/2018   IR US  GUIDE VASC ACCESS RIGHT  09/07/2018   IR US  GUIDE VASC ACCESS RIGHT  10/16/2020   IR US  GUIDE VASC ACCESS RIGHT  10/16/2020   IR US  GUIDE VASC ACCESS RIGHT  10/03/2022   ORIF FEMUR FRACTURE Right 11/22/2016   Procedure: OPEN REDUCTION INTERNAL FIXATION (ORIF) DISTAL FEMUR FRACTURE;  Surgeon: Redell Shoals, MD;  Location: [facility] OR;  Service: Orthopedics;  Laterality: Right;   PATCH ANGIOPLASTY Right 12/10/2017   Procedure: PATCH ANGIOPLASTY;  Surgeon: Blade Lonni GORMAN, MD;  Location: [facility] OR;  Service: Vascular;  Laterality: Right;   PERIPHERAL VASCULAR BALLOON ANGIOPLASTY  08/13/2018   Procedure: PERIPHERAL VASCULAR BALLOON ANGIOPLASTY;  Surgeon: Harvey Carlin BRAVO, MD;  Location: [facility] INVASIVE CV LAB;  Service: Cardiovascular;;  right AV fistula    PERIPHERAL VASCULAR BALLOON ANGIOPLASTY  11/22/2018   Procedure: PERIPHERAL VASCULAR BALLOON ANGIOPLASTY;  Surgeon: Sheree Penne Bruckner, MD;  Location: [facility] INVASIVE CV LAB;  Service: Cardiovascular;;  rt AV fistula   PERIPHERAL VASCULAR BALLOON ANGIOPLASTY  10/08/2022   Procedure: PERIPHERAL VASCULAR BALLOON ANGIOPLASTY;  Surgeon: Lanis Fonda BRAVO, MD;  Location: [facility] INVASIVE CV LAB;  Service: Cardiovascular;;  arterial anastamosis   PERIPHERAL VASCULAR CATHETERIZATION N/A 10/08/2015   Procedure: A/V Shuntogram;  Surgeon: Bruckner GORMAN Blade, MD;  Location: [facility] INVASIVE CV LAB;  Service: Cardiovascular;  Laterality: N/A;   PERIPHERAL VASCULAR INTERVENTION Left 10/08/2022   Procedure: PERIPHERAL VASCULAR INTERVENTION;  Surgeon: Lanis Fonda BRAVO, MD;  Location: [facility] INVASIVE CV LAB;  Service: Cardiovascular;  Laterality: Left;  left AVF   REMOVAL OF A DIALYSIS CATHETER N/A  12/03/2021   Procedure: MINOR REMOVAL OF A TUNNELED DIALYSIS CATHETER;  Surgeon: Oris Krystal FALCON, MD;  Location: [facility]S;  Service: Vascular;  Laterality: N/A;   REMOVAL OF A DIALYSIS CATHETER N/A 11/18/2022   Procedure: MINOR REMOVAL OF A TUNNELED DIALYSIS CATHETER;  Surgeon: Oris Krystal FALCON, MD;  Location: [facility]S;  Service: Vascular;  Laterality: N/A;   THROMBECTOMY W/ EMBOLECTOMY Right 12/10/2017   Procedure: THROMBECTOMY REVISION RIGHT ARM  ARTERIOVENOUS FISTULA;  Surgeon: Blade Bruckner GORMAN, MD;  Location: [facility] OR;  Service: Vascular;  Laterality: Right;   TRANSURETHRAL RESECTION OF PROSTATE N/A 01/04/2014   Procedure: TRANSURETHRAL RESECTION OF THE PROSTATE (TURP) (procedure #2);  Surgeon: Mohammad I Javaid, MD;  Location: [facility] ORS;  Service: Urology;  Laterality: N/A;    Social History   Socioeconomic History   Marital status: Single    Spouse name: Not on file   Number of children: Not on file   Years of education: Not on file   Highest education level: Not on file  Occupational History   Occupation: retired   Tobacco Use   Smoking status: Never   Smokeless tobacco: Never  Vaping Use   Vaping status: Never Used  Substance and Sexual Activity   Alcohol use: No   Drug use: No   Sexual activity: Not Currently  Other Topics Concern   Not on file  Social History Narrative   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   Long term resident of [facility]    Social Drivers of Health   Financial Resource Strain: Low Risk  (02/18/2018)   Overall Financial Resource Strain (CARDIA)    Difficulty of Paying Living Expenses: Not hard at all  Food Insecurity: No Food Insecurity (02/18/2018)   Hunger Vital Sign    Worried About Running Out of Food in the Last Year: Never true    Ran Out of Food in the Last Year: Never true  Transportation Needs: No Transportation Needs (02/18/2018)   PRAPARE - Administrator, Civil Service (Medical): No    Lack of  Transportation (Non-Medical): No  Physical Activity: Unknown (09/15/2019)   Exercise Vital Sign    Days of Exercise per Week: Not on file    Minutes of Exercise per Session: 0 min  Stress: No Stress Concern Present (02/18/2018)   [employer] of Occupational Health - Occupational Stress Questionnaire    Feeling of Stress : Not at all  Social Connections: Unknown (09/15/2019)   Social Connection and Isolation Panel [NHANES]    Frequency of Communication with Friends and Family: Not on file    Frequency of Social Gatherings with Friends and Family: Never    Attends Religious Services: Not on file  Active Member of Clubs or Organizations: Not on file    Attends Club or Organization Meetings: Not on file    Marital Status: Not on file  Intimate Partner Violence: Not At Risk (02/18/2018)   Humiliation, Afraid, Rape, and Kick questionnaire    Fear of Current or Ex-Partner: No    Emotionally Abused: No    Physically Abused: No    Sexually Abused: No   Family History  Problem Relation Age of Onset   Cancer Mother    Colon cancer Neg Hx       VITAL SIGNS BP 136/70   Pulse 74   Temp (!) 97.1 F (36.2 C)   Resp 20   Ht 5' 6 (1.676 m)   Wt 212 lb 6.4 oz (96.3 kg)   SpO2 98%   BMI 34.28 kg/m   Outpatient Encounter Medications as of 12/04/2023  Medication Sig   aspirin  EC 81 MG tablet Take 81 mg by mouth daily. 0900   Insulin  Glargine (BASAGLAR  KWIKPEN) 100 UNIT/ML Inject 8 Units into the skin at bedtime. 2200   Insulin  Pen Needle (BD AUTOSHIELD DUO) 30G X 5 MM MISC by Does not apply route. 3/16   levothyroxine  (SYNTHROID ) 125 MCG tablet Take 125 mcg by mouth at bedtime. 10 am, Special Instructions: Must be given (1) hour apart from food or other medications per pharmacy recommendations   midodrine  (PROAMATINE ) 10 MG tablet Take 10 mg by mouth See admin instructions. Send med with resident on  Tues/Thurs/Sat to dialysis. do not give at [facility], give to resident to take  with him. send with 5mg  to equal 15mg . 6:00   midodrine  (PROAMATINE ) 5 MG tablet Take 5 mg by mouth See admin instructions. Send with resident on dialysis days.  Tues/Thurs/Sat  Take along with 10 mg to equal 15 mg 6:00   NON FORMULARY Diet: Regular diet   Nutritional Supplements (,FEEDING SUPPLEMENT, PROSOURCE PLUS) liquid Take 30 mLs by mouth 2 (two) times daily between meals. 1600 and 2200   omega-3 acid ethyl esters (LOVAZA ) 1 g capsule Take 2 g by mouth at bedtime. 2200   omeprazole (PRILOSEC) 40 MG capsule Take 40 mg by mouth at bedtime. 2200   polyethylene glycol (MIRALAX  / GLYCOLAX ) packet Take 17 g by mouth daily as needed for mild constipation.    rosuvastatin  (CRESTOR ) 20 MG tablet Take 20 mg by mouth daily.   sevelamer  carbonate (RENVELA ) 800 MG tablet Take 3 tablets (2,400 mg total) by mouth 3 (three) times daily with meals.   tamsulosin  (FLOMAX ) 0.4 MG CAPS capsule Take 0.4 mg by mouth at bedtime. Give 30 minutes after a meal. Do not crush or chew   torsemide  (DEMADEX ) 20 MG tablet Take 20 mg by mouth See admin instructions. 20mg  daily at 2000 on Sunday, Monday, Wednesday, and Friday only.   vitamin B-12 (CYANOCOBALAMIN ) 1000 MCG tablet Take 1,000 mcg by mouth at bedtime.   No facility-administered encounter medications on file as of 12/04/2023.     SIGNIFICANT DIAGNOSTIC EXAMS  PREVIOUS   06-11-22: dexa: t score: -2.052  NO NEW EXAMS.    LABS REVIEWED PREVIOUS;       02-13-23: wbc 3.8; hgb 9.7; hct 28.7; mcv 101.1 plt 126; glucose 67; bun 18; creat 6.28; k+ 2.9; na++ 137; ca 8.8; gfr 9; protein 6.9; albumin  3.2 mag 1.8; occult blood: neg 02-23-23: hgb 9.8; hct 29.7 glucose 110; bun 24; creat 8.32 k+ 3.6; na++ 139; ca 9.6 gfr 6  06-11-23: wbc 4.4;  hgb 11.7; hct 35.2; mcv 102.3 plt 88; glucose 160; bun 20; creat 4.71; k+ 3.5; na++ 137; ca 8.4; gfr 12   06-13-23: wbc 3.6; hgb 13.0; hct 39.2; mcv 102.6 plt 110; glucose 103; bun 11; creat 4.13; k+ 3.3; na++ 138; ca 8.6; gfr 14;  d-dimer 0.54; CRP <0.5  06-16-23: glucose 99; bun 40; creat 10.88; k+ 4.6; na++ 136; ca 8.8; gfr 5 07-16-23: hgb A1c 5.7; chol 134; ldl 39; trig 486; hdl 24 09-03-23: tsh 3.852 free t4: 0.82 09-14-23: protein 6.9 albumin  3.2; chol 127; ldl 37 trig 420 hdl 25  NO NEW LABS.    Review of Systems  Constitutional:  Negative for malaise/fatigue.  Respiratory:  Negative for cough and shortness of breath.   Cardiovascular:  Negative for chest pain, palpitations and leg swelling.  Gastrointestinal:  Negative for abdominal pain, constipation and heartburn.  Musculoskeletal:  Negative for back pain, joint pain and myalgias.  Skin: Negative.   Neurological:  Negative for dizziness.  Psychiatric/Behavioral:  The patient is not nervous/anxious.    Physical Exam Constitutional:      General: He is not in acute distress.    Appearance: He is well-developed. He is obese. He is not diaphoretic.  Neck:     Thyroid : No thyromegaly.  Cardiovascular:     Rate and Rhythm: Normal rate and regular rhythm.     Pulses: Normal pulses.     Heart sounds: Normal heart sounds.  Pulmonary:     Effort: Pulmonary effort is normal. No respiratory distress.     Breath sounds: Normal breath sounds.  Abdominal:     General: Bowel sounds are normal. There is no distension.     Palpations: Abdomen is soft.     Tenderness: There is no abdominal tenderness.  Musculoskeletal:        General: Normal range of motion.     Cervical back: Neck supple.     Right lower leg: No edema.     Left lower leg: No edema.  Lymphadenopathy:     Cervical: No cervical adenopathy.  Skin:    General: Skin is warm and dry.     Comments: Left arm fistula +thrill+bruit        Neurological:     Mental Status: He is alert. Mental status is at baseline.     Comments: SLUMS 2/30     Psychiatric:        Mood and Affect: Mood normal.      ASSESSMENT/ PLAN:  TODAY  Dependence on renal dialysis Long term insulin   use Thrombocytopenia Aortic atherosclerosis  Will continue current medications Will continue current plan of care Will continue to monitor his status  Time spent with patient: 40 minutes: medications; plan of care; dietary    Barnie Seip NP [facility]  call [phone]

## 2023-12-05 DIAGNOSIS — D509 Iron deficiency anemia, unspecified: Secondary | ICD-10-CM | POA: Diagnosis not present

## 2023-12-05 DIAGNOSIS — N186 End stage renal disease: Secondary | ICD-10-CM | POA: Diagnosis not present

## 2023-12-05 DIAGNOSIS — N250 Renal osteodystrophy: Secondary | ICD-10-CM | POA: Diagnosis not present

## 2023-12-05 DIAGNOSIS — N2581 Secondary hyperparathyroidism of renal origin: Secondary | ICD-10-CM | POA: Diagnosis not present

## 2023-12-05 DIAGNOSIS — Z992 Dependence on renal dialysis: Secondary | ICD-10-CM | POA: Diagnosis not present

## 2023-12-05 DIAGNOSIS — D631 Anemia in chronic kidney disease: Secondary | ICD-10-CM | POA: Diagnosis not present

## 2023-12-08 DIAGNOSIS — D631 Anemia in chronic kidney disease: Secondary | ICD-10-CM | POA: Diagnosis not present

## 2023-12-08 DIAGNOSIS — N250 Renal osteodystrophy: Secondary | ICD-10-CM | POA: Diagnosis not present

## 2023-12-08 DIAGNOSIS — D509 Iron deficiency anemia, unspecified: Secondary | ICD-10-CM | POA: Diagnosis not present

## 2023-12-08 DIAGNOSIS — Z992 Dependence on renal dialysis: Secondary | ICD-10-CM | POA: Diagnosis not present

## 2023-12-08 DIAGNOSIS — N186 End stage renal disease: Secondary | ICD-10-CM | POA: Diagnosis not present

## 2023-12-08 DIAGNOSIS — N2581 Secondary hyperparathyroidism of renal origin: Secondary | ICD-10-CM | POA: Diagnosis not present

## 2023-12-09 ENCOUNTER — Encounter: Payer: Self-pay | Admitting: Adult Health

## 2023-12-09 ENCOUNTER — Non-Acute Institutional Stay (SKILLED_NURSING_FACILITY): Payer: Self-pay | Admitting: Adult Health

## 2023-12-09 DIAGNOSIS — D696 Thrombocytopenia, unspecified: Secondary | ICD-10-CM

## 2023-12-09 DIAGNOSIS — I700 Atherosclerosis of aorta: Secondary | ICD-10-CM | POA: Diagnosis not present

## 2023-12-09 DIAGNOSIS — Z992 Dependence on renal dialysis: Secondary | ICD-10-CM

## 2023-12-09 DIAGNOSIS — Z794 Long term (current) use of insulin: Secondary | ICD-10-CM

## 2023-12-09 DIAGNOSIS — N186 End stage renal disease: Secondary | ICD-10-CM | POA: Diagnosis not present

## 2023-12-09 DIAGNOSIS — E1122 Type 2 diabetes mellitus with diabetic chronic kidney disease: Secondary | ICD-10-CM | POA: Diagnosis not present

## 2023-12-09 NOTE — Progress Notes (Signed)
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 There are no reports of uncontrolled pain. He continues to get out of bed daily; there are no reports of anxiety or depressive thoughts.   Past Medical History:  Diagnosis Date   Abnormal CT scan, kidney 10/06/2011   Acute pyelonephritis 10/07/2011   Anemia    normocytic   Anxiety    mental retardation   Bladder wall thickening 10/06/2011   BPH (benign prostatic hypertrophy)    Diabetes mellitus    Dialysis patient (HCC)    Tuesday, Thursday and Saturday,    DVT of leg (deep venous thrombosis) (HCC) 12/25/2016   Edema     history of lower extremity edema   GERD (gastroesophageal reflux disease)    Heme positive stool    Hydronephrosis    Hyperkalemia    Hyperlipidemia    Hypernatremia    Hypertension    Hypothyroidism    Impaired speech    Infected prosthetic vascular graft (HCC)    MR (mental retardation)    Muscle weakness    Obstructive uropathy    Perinephric abscess 10/07/2011   Poor historian poor historian   Primary colorectal adenocarcinoma (HCC) 04/2017   S/p total colectomy   Protein calorie malnutrition (HCC)    Pyelonephritis    Renal failure (ARF), acute on chronic (HCC)    Renal insufficiency    chronic history   Sepsis (HCC)    Smoking    Uremia    Urinary retention    UTI (lower urinary tract infection) 10/06/2011    Past  Surgical History:  Procedure Laterality Date   A/V FISTULAGRAM N/A 08/13/2018   Procedure: A/V FISTULAGRAM - Right Upper;  Surgeon: Sherren Kerns, MD;  Location: [facility] INVASIVE CV LAB;  Service: Cardiovascular;  Laterality: N/A;   A/V FISTULAGRAM N/A 11/22/2018   Procedure: A/V FISTULAGRAM - Right Upper;  Surgeon: Maeola Harman, MD;  Location: [facility] INVASIVE CV LAB;  Service: Cardiovascular;  Laterality: N/A;   A/V FISTULAGRAM Left 10/08/2022   Procedure: A/V Fistulagram;  Surgeon: Victorino Sparrow, MD;  Location: [facility] INVASIVE CV LAB;  Service: Cardiovascular;  Laterality: Left;   A/V FISTULAGRAM Left 05/06/2023   Procedure: A/V Fistulagram;  Surgeon: Leonie Douglas, MD;  Location: [facility] INVASIVE CV LAB;  Service: Cardiovascular;  Laterality: Left;   AV FISTULA PLACEMENT Left 07/06/2015   Procedure:  INSERTION LEFT ARM ARTERIOVENOUS GORTEX GRAFT;  Surgeon: Chuck Hint, MD;  Location: [facility] OR;  Service: Vascular;  Laterality: Left;   AV FISTULA PLACEMENT Right 02/26/2016   Procedure: ARTERIOVENOUS (AV) FISTULA CREATION ;  Surgeon: Chuck Hint, MD;  Location: [facility] OR;  Service: Vascular;  Laterality: Right;   AV FISTULA PLACEMENT Right 11/25/2018   Procedure: INSERTION OF ARTERIOVENOUS (AV) ARTEGRAFT RIGHT UPPER ARM;  Surgeon: Maeola Harman, MD;  Location: [facility] OR;  Service: Vascular;  Laterality: Right;   AV FISTULA PLACEMENT Left 05/28/2021   Procedure: LEFT ARM ARTERIOVENOUS (AV) FISTULA;  Surgeon: Larina Earthly, MD;  Location: [facility] ORS;  Service: Vascular;  Laterality: Left;   AVGG REMOVAL Left 10/09/2015   Procedure: REMOVAL OF ARTERIOVENOUS GORETEX GRAFT (AVGG) Evacuation of Lymphocele, Vein Patch angioplasty of brachial artery.;  Surgeon: Chuck Hint, MD;  Location: [facility] OR;  Service: Vascular;  Laterality: Left;   BASCILIC VEIN TRANSPOSITION Right 02/26/2016   Procedure: Right BASCILIC VEIN TRANSPOSITION;  Surgeon: Chuck Hint, MD;   Location: [facility] OR;  Service: Vascular;  Laterality: Right;   BASCILIC VEIN TRANSPOSITION Left 07/30/2021   Procedure: LEFT ARM SECOND STAGE BASILIC VEIN TRANSPOSITION;  Surgeon: Larina Earthly, MD;  Location: [facility] ORS;  Service: Vascular;  Laterality: Left;   CIRCUMCISION N/A 01/04/2014   Procedure: CIRCUMCISION ADULT (procedure #1);  Surgeon: Ky Barban, MD;  Location: [facility] ORS;  Service: Urology;  Laterality: N/A;   COLECTOMY N/A 05/04/2017   Procedure: TOTAL COLECTOMY;  Surgeon: Franky Macho, MD;  Location: [facility] ORS;  Service: General;  Laterality: N/A;   COLONOSCOPY N/A 04/27/2017   Surgeon: Corbin Ade, MD; annular mass in the ascending colon likely representing cancer biopsied, multiple 6-22 mm polyps removed, clean rectum.  Pathology with multiple tubular adenomas, high-grade dysplasia noted in ascending colon and splenic flexure.   CYSTOSCOPY W/ RETROGRADES Bilateral 06/29/2015   Procedure: CYSTOSCOPY, DILATION OF URETHRAL STRICTURE WITH BILATERAL RETROGRADE PYELOGRAM,SUPRAPUBIC TUBE CHANGE;  Surgeon: Jerilee Field, MD;  Location: [facility] ORS;  Service: Urology;  Laterality: Bilateral;   CYSTOSCOPY WITH URETHRAL DILATATION N/A 12/29/2013   Procedure: CYSTOSCOPY WITH URETHRAL DILATATION;  Surgeon: Ky Barban, MD;  Location: [facility]S;  Service: Urology;  Laterality: N/A;   ESOPHAGOGASTRODUODENOSCOPY N/A 04/27/2017   Procedure: ESOPHAGOGASTRODUODENOSCOPY (EGD);  Surgeon: Corbin Ade, MD;  Location: [facility];  Service: Endoscopy;  Laterality: N/A;   FLEXIBLE SIGMOIDOSCOPY N/A 12/02/2021   Procedure: FLEXIBLE SIGMOIDOSCOPY;  Surgeon: Lanelle Bal, DO;  Location: [facility];  Service: Endoscopy;  Laterality: N/A;  10:30am, dialysis patient   INSERTION OF DIALYSIS CATHETER Right 11/25/2018   Procedure: INSERTION OF DIALYSIS CATHETER RIGHT INTERNAL JUGULAR;  Surgeon: Maeola Harman, MD;  Location: [facility] OR;  Service: Vascular;  Laterality: Right;   IR AV DIALY SHUNT  INTRO NEEDLE/INTRACATH INITIAL W/PTA/IMG RIGHT Right 09/07/2018   IR FLUORO GUIDE CV LINE RIGHT  10/16/2020   IR FLUORO GUIDE CV LINE RIGHT  10/03/2022   IR REMOVAL TUN CV CATH W/O FL  01/12/2019   IR THROMBECTOMY AV FISTULA W/THROMBOLYSIS/PTA INC/SHUNT/IMG RIGHT Right 04/26/2018   IR US GUIDE VASC ACCESS RIGHT  04/26/2018   IR US GUIDE VASC ACCESS RIGHT  09/07/2018   IR US GUIDE VASC ACCESS RIGHT  10/16/2020   IR US GUIDE VASC ACCESS RIGHT  10/16/2020   IR US GUIDE VASC ACCESS RIGHT  10/03/2022   ORIF FEMUR FRACTURE Right 11/22/2016   Procedure: OPEN REDUCTION INTERNAL FIXATION (ORIF) DISTAL FEMUR FRACTURE;  Surgeon: Samson Frederic, MD;  Location: [facility] OR;  Service: Orthopedics;  Laterality: Right;   PATCH ANGIOPLASTY Right 12/10/2017   Procedure: PATCH ANGIOPLASTY;  Surgeon: Chuck Hint, MD;  Location: [facility] OR;  Service: Vascular;  Laterality: Right;   PERIPHERAL VASCULAR BALLOON ANGIOPLASTY  08/13/2018   Procedure: PERIPHERAL VASCULAR BALLOON ANGIOPLASTY;  Surgeon: Sherren Kerns, MD;  Location: [facility] INVASIVE CV LAB;  Service: Cardiovascular;;  right AV fistula    PERIPHERAL VASCULAR BALLOON  ANGIOPLASTY  11/22/2018   Procedure: PERIPHERAL VASCULAR BALLOON ANGIOPLASTY;  Surgeon: Maeola Harman, MD;  Location: [facility] INVASIVE CV LAB;  Service: Cardiovascular;;  rt AV fistula   PERIPHERAL VASCULAR BALLOON ANGIOPLASTY  10/08/2022   Procedure: PERIPHERAL VASCULAR BALLOON ANGIOPLASTY;  Surgeon: Victorino Sparrow, MD;  Location: [facility] INVASIVE CV LAB;  Service: Cardiovascular;;  arterial anastamosis   PERIPHERAL VASCULAR CATHETERIZATION N/A 10/08/2015   Procedure: A/V Shuntogram;  Surgeon: Chuck Hint, MD;  Location: [facility] INVASIVE CV LAB;  Service: Cardiovascular;  Laterality: N/A;   PERIPHERAL VASCULAR INTERVENTION Left 10/08/2022   Procedure: PERIPHERAL VASCULAR INTERVENTION;  Surgeon: Victorino Sparrow, MD;  Location: [facility] INVASIVE CV LAB;  Service: Cardiovascular;  Laterality:  Left;  left AVF   REMOVAL OF A DIALYSIS CATHETER N/A 12/03/2021   Procedure: MINOR REMOVAL OF A TUNNELED DIALYSIS CATHETER;  Surgeon: Larina Earthly, MD;  Location: [facility]S;  Service: Vascular;  Laterality: N/A;   REMOVAL OF A DIALYSIS CATHETER N/A 11/18/2022   Procedure: MINOR REMOVAL OF A TUNNELED DIALYSIS CATHETER;  Surgeon: Larina Earthly, MD;  Location: [facility];  Service: Vascular;  Laterality: N/A;   THROMBECTOMY W/ EMBOLECTOMY Right 12/10/2017   Procedure: THROMBECTOMY REVISION RIGHT ARM  ARTERIOVENOUS FISTULA;  Surgeon: Chuck Hint, MD;  Location: [facility] OR;  Service: Vascular;  Laterality: Right;   TRANSURETHRAL RESECTION OF PROSTATE N/A 01/04/2014   Procedure: TRANSURETHRAL RESECTION OF THE PROSTATE (TURP) (procedure #2);  Surgeon: Ky Barban, MD;  Location: [facility];  Service: Urology;  Laterality: N/A;    Social History   Socioeconomic History   Marital status: Single    Spouse name: Not on file   Number of children: Not on file   Years of education: Not on file   Highest education level: Not on file  Occupational History   Occupation: retired   Tobacco Use   Smoking status: Never   Smokeless tobacco: Never  Vaping Use   Vaping status: Never Used  Substance and Sexual Activity   Alcohol use: No   Drug use: No   Sexual activity: Not Currently  Other Topics Concern   Not on file  Social History Narrative   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   Long term resident of [facility]    Social Drivers of Health   Financial Resource Strain: Low Risk  (02/18/2018)   Overall Financial Resource Strain (CARDIA)    Difficulty of Paying Living Expenses: Not hard at all  Food Insecurity: No Food Insecurity (02/18/2018)   Hunger Vital Sign    Worried About Running Out of Food in the Last Year: Never true    Ran Out of Food in the Last Year: Never true  Transportation Needs: No Transportation Needs (02/18/2018)   PRAPARE - Therapist, art (Medical): No    Lack of Transportation (Non-Medical): No  Physical Activity: Unknown (09/15/2019)   Exercise Vital Sign    Days of Exercise per Week: Not on file    Minutes of Exercise per Session: 0 min  Stress: No Stress Concern Present (02/18/2018)   [employer] of Occupational Health - Occupational Stress Questionnaire    Feeling of Stress : Not at all  Social Connections: Unknown (09/15/2019)   Social Connection and Isolation Panel [NHANES]    Frequency of Communication with Friends and Family: Not on file    Frequency of Social Gatherings with Friends and Family: Never    Attends Religious Services: Not  on file    Active Member of Clubs or Organizations: Not on file    Attends Club or Organization Meetings: Not on file    Marital Status: Not on file  Intimate Partner Violence: Not At Risk (02/18/2018)   Humiliation, Afraid, Rape, and Kick questionnaire    Fear of Current or Ex-Partner: No    Emotionally Abused: No    Physically Abused: No    Sexually Abused: No   Family History  Problem Relation Age of Onset   Cancer Mother    Colon cancer Neg Hx       VITAL SIGNS BP 132/62   Pulse 74   Temp (!) 97.3 F (36.3 C)   Resp 20   Ht 5\' 6"  (1.676 m)   Wt 212 lb 6.4 oz (96.3 kg)   SpO2 98%   BMI 34.28 kg/m   Outpatient Encounter Medications as of 12/09/2023  Medication Sig   aspirin EC 81 MG tablet Take 81 mg by mouth daily. 0900   Insulin Glargine (BASAGLAR KWIKPEN) 100 UNIT/ML Inject 8 Units into the skin at bedtime. 2200   Insulin Pen Needle (BD AUTOSHIELD DUO) 30G X 5 MM MISC by Does not apply route. 3/16"   levothyroxine (SYNTHROID) 125 MCG tablet Take 125 mcg by mouth at bedtime. 10 am, Special Instructions: Must be given (1) hour apart from food or other medications per pharmacy recommendations   midodrine (PROAMATINE) 10 MG tablet Take 10 mg by mouth See admin instructions. Send med with resident on  Tues/Thurs/Sat to dialysis. do not  give at [facility], give to resident to take with him. send with 5mg  to equal 15mg . 6:00   midodrine (PROAMATINE) 5 MG tablet Take 5 mg by mouth See admin instructions. Send with resident on dialysis days.  Tues/Thurs/Sat  Take along with 10 mg to equal 15 mg 6:00   NON FORMULARY Diet: Regular diet   Nutritional Supplements (,FEEDING SUPPLEMENT, PROSOURCE PLUS) liquid Take 30 mLs by mouth 2 (two) times daily between meals. 1600 and 2200   omega-3 acid ethyl esters (LOVAZA) 1 g capsule Take 2 g by mouth at bedtime. 2200   omeprazole (PRILOSEC) 40 MG capsule Take 40 mg by mouth at bedtime. 2200   polyethylene glycol (MIRALAX / GLYCOLAX) packet Take 17 g by mouth daily as needed for mild constipation.    rosuvastatin (CRESTOR) 20 MG tablet Take 20 mg by mouth daily.   sevelamer carbonate (RENVELA) 800 MG tablet Take 3 tablets (2,400 mg total) by mouth 3 (three) times daily with meals.   tamsulosin (FLOMAX) 0.4 MG CAPS capsule Take 0.4 mg by mouth at bedtime. Give 30 minutes after a meal. Do not crush or chew   torsemide (DEMADEX) 20 MG tablet Take 20 mg by mouth See admin instructions. 20mg  daily at 2000 on Sunday, Monday, Wednesday, and Friday only.   vitamin B-12 (CYANOCOBALAMIN) 1000 MCG tablet Take 1,000 mcg by mouth at bedtime.   No facility-administered encounter medications on file as of 12/09/2023.     SIGNIFICANT DIAGNOSTIC EXAMS  PREVIOUS   06-11-22: dexa: t score: -2.052  NO NEW EXAMS.    LABS REVIEWED PREVIOUS;      02-13-23: wbc 3.8; hgb 9.7; hct 28.7; mcv 101.1 plt 126; glucose 67; bun 18; creat 6.28; k+ 2.9; na++ 137; ca 8.8; gfr 9; protein 6.9; albumin 3.2 mag 1.8; occult blood: neg 02-23-23: hgb 9.8; hct 29.7 glucose 110; bun 24; creat 8.32 k+ 3.6; na++ 139; ca 9.6 gfr 6  06-11-23: wbc 4.4; hgb 11.7; hct 35.2; mcv 102.3 plt 88; glucose 160; bun 20; creat 4.71; k+ 3.5; na++ 137; ca 8.4; gfr 12   06-13-23: wbc 3.6; hgb 13.0; hct 39.2; mcv 102.6 plt 110; glucose 103; bun 11;  creat 4.13; k+ 3.3; na++ 138; ca 8.6; gfr 14; d-dimer 0.54; CRP <0.5  06-16-23: glucose 99; bun 40; creat 10.88; k+ 4.6; na++ 136; ca 8.8; gfr 5 07-16-23: hgb A1c 5.7; chol 134; ldl 39; trig 486; hdl 24 09-03-23: tsh 3.852 free t4: 0.82 09-14-23: protein 6.9 albumin 3.2; chol 127; ldl 37 trig 420 hdl 25  NO NEW LABS.    Review of Systems  Constitutional:  Negative for malaise/fatigue.  Respiratory:  Negative for cough and shortness of breath.   Cardiovascular:  Negative for chest pain, palpitations and leg swelling.  Gastrointestinal:  Negative for abdominal pain, constipation and heartburn.  Musculoskeletal:  Negative for back pain, joint pain and myalgias.  Skin: Negative.   Neurological:  Negative for dizziness.  Psychiatric/Behavioral:  The patient is not nervous/anxious.    Physical Exam Constitutional:      General: He is not in acute distress.    Appearance: He is well-developed. He is obese. He is not diaphoretic.  Neck:     Thyroid: No thyromegaly.  Cardiovascular:     Rate and Rhythm: Normal rate and regular rhythm.     Pulses: Normal pulses.     Heart sounds: Normal heart sounds.  Pulmonary:     Effort: Pulmonary effort is normal. No respiratory distress.     Breath sounds: Normal breath sounds.  Abdominal:     General: Bowel sounds are normal. There is no distension.     Palpations: Abdomen is soft.     Tenderness: There is no abdominal tenderness.  Musculoskeletal:        General: Normal range of motion.     Cervical back: Neck supple.     Right lower leg: No edema.     Left lower leg: No edema.  Lymphadenopathy:     Cervical: No cervical adenopathy.  Skin:    General: Skin is warm and dry.     Comments: Left arm fistula +thrill+bruit    Neurological:     Mental Status: He is alert. Mental status is at baseline.     Comments: SLUMS 2/30      Psychiatric:        Mood and Affect: Mood normal.     ASSESSMENT/ PLAN:  TODAY  Thrombocytopenia: plt 110    2. Aortic atherosclerosis (ct 12-30-21) is on asa and statin  3. 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 Other specified hypothyroidism: tsh 3.852 will continue synthroid 125 mcg daily   5. 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 Major depression single episode moderate: is presently off medications.   7. Osteopenia: t score -2.052 is on supplements  8. GERD without esophagitis: is off PPI   9. History of colon cancer: s/p colectomy last seen by GI on 12-19-21; ct scan done on 12-30-21.  10. Dyslipidemia associated with type 2 diabetes mellitus: ldl 37 trig 420 will continue crestor  20 mg daily    11. Vitamin B 12 deficiency: is on daily supplements  12. Secondary hyperparathyroidism of  renal origin: will monitor   13. Bilateral lower extremity edema: will continue demedex 20 mg four days per week.   14. Type 2 diabetes mellitus with hypertension and end stage renal disease on hemodialysis: hgb A1c 5.7 will continue basaglar 8 units nightly is on asa and statin     Synthia Innocent NP [facility]   call [phone]

## 2023-12-10 DIAGNOSIS — N2581 Secondary hyperparathyroidism of renal origin: Secondary | ICD-10-CM | POA: Diagnosis not present

## 2023-12-10 DIAGNOSIS — D509 Iron deficiency anemia, unspecified: Secondary | ICD-10-CM | POA: Diagnosis not present

## 2023-12-10 DIAGNOSIS — D631 Anemia in chronic kidney disease: Secondary | ICD-10-CM | POA: Diagnosis not present

## 2023-12-10 DIAGNOSIS — N250 Renal osteodystrophy: Secondary | ICD-10-CM | POA: Diagnosis not present

## 2023-12-10 DIAGNOSIS — Z992 Dependence on renal dialysis: Secondary | ICD-10-CM | POA: Diagnosis not present

## 2023-12-10 DIAGNOSIS — N186 End stage renal disease: Secondary | ICD-10-CM | POA: Diagnosis not present

## 2023-12-11 DIAGNOSIS — E1151 Type 2 diabetes mellitus with diabetic peripheral angiopathy without gangrene: Secondary | ICD-10-CM | POA: Diagnosis not present

## 2023-12-11 DIAGNOSIS — L602 Onychogryphosis: Secondary | ICD-10-CM | POA: Diagnosis not present

## 2023-12-11 DIAGNOSIS — L603 Nail dystrophy: Secondary | ICD-10-CM | POA: Diagnosis not present

## 2023-12-11 DIAGNOSIS — Z794 Long term (current) use of insulin: Secondary | ICD-10-CM | POA: Diagnosis not present

## 2023-12-12 DIAGNOSIS — N2581 Secondary hyperparathyroidism of renal origin: Secondary | ICD-10-CM | POA: Diagnosis not present

## 2023-12-12 DIAGNOSIS — N250 Renal osteodystrophy: Secondary | ICD-10-CM | POA: Diagnosis not present

## 2023-12-12 DIAGNOSIS — D509 Iron deficiency anemia, unspecified: Secondary | ICD-10-CM | POA: Diagnosis not present

## 2023-12-12 DIAGNOSIS — Z992 Dependence on renal dialysis: Secondary | ICD-10-CM | POA: Diagnosis not present

## 2023-12-12 DIAGNOSIS — N186 End stage renal disease: Secondary | ICD-10-CM | POA: Diagnosis not present

## 2023-12-12 DIAGNOSIS — D631 Anemia in chronic kidney disease: Secondary | ICD-10-CM | POA: Diagnosis not present

## 2023-12-15 DIAGNOSIS — D631 Anemia in chronic kidney disease: Secondary | ICD-10-CM | POA: Diagnosis not present

## 2023-12-15 DIAGNOSIS — Z992 Dependence on renal dialysis: Secondary | ICD-10-CM | POA: Diagnosis not present

## 2023-12-15 DIAGNOSIS — N250 Renal osteodystrophy: Secondary | ICD-10-CM | POA: Diagnosis not present

## 2023-12-15 DIAGNOSIS — N2581 Secondary hyperparathyroidism of renal origin: Secondary | ICD-10-CM | POA: Diagnosis not present

## 2023-12-15 DIAGNOSIS — D509 Iron deficiency anemia, unspecified: Secondary | ICD-10-CM | POA: Diagnosis not present

## 2023-12-15 DIAGNOSIS — N186 End stage renal disease: Secondary | ICD-10-CM | POA: Diagnosis not present

## 2023-12-17 ENCOUNTER — Other Ambulatory Visit (HOSPITAL_COMMUNITY)
Admission: RE | Admit: 2023-12-17 | Discharge: 2023-12-17 | Disposition: A | Discharge status: Home / Self Care | Payer: Medicare Other | Source: Skilled Nursing Facility | Attending: Adult Health | Admitting: Adult Health

## 2023-12-17 ENCOUNTER — Encounter: Payer: Self-pay | Admitting: Internal Medicine

## 2023-12-17 ENCOUNTER — Non-Acute Institutional Stay (SKILLED_NURSING_FACILITY): Payer: Self-pay | Admitting: Internal Medicine

## 2023-12-17 DIAGNOSIS — D696 Thrombocytopenia, unspecified: Secondary | ICD-10-CM | POA: Diagnosis not present

## 2023-12-17 DIAGNOSIS — E1122 Type 2 diabetes mellitus with diabetic chronic kidney disease: Secondary | ICD-10-CM

## 2023-12-17 DIAGNOSIS — I120 Hypertensive chronic kidney disease with stage 5 chronic kidney disease or end stage renal disease: Secondary | ICD-10-CM

## 2023-12-17 DIAGNOSIS — E1129 Type 2 diabetes mellitus with other diabetic kidney complication: Secondary | ICD-10-CM | POA: Insufficient documentation

## 2023-12-17 DIAGNOSIS — E538 Deficiency of other specified B group vitamins: Secondary | ICD-10-CM

## 2023-12-17 DIAGNOSIS — N186 End stage renal disease: Secondary | ICD-10-CM

## 2023-12-17 DIAGNOSIS — D631 Anemia in chronic kidney disease: Secondary | ICD-10-CM

## 2023-12-17 DIAGNOSIS — Z125 Encounter for screening for malignant neoplasm of prostate: Secondary | ICD-10-CM | POA: Diagnosis not present

## 2023-12-17 DIAGNOSIS — E781 Pure hyperglyceridemia: Secondary | ICD-10-CM

## 2023-12-17 DIAGNOSIS — Z8781 Personal history of (healed) traumatic fracture: Secondary | ICD-10-CM | POA: Diagnosis not present

## 2023-12-17 DIAGNOSIS — Z992 Dependence on renal dialysis: Secondary | ICD-10-CM

## 2023-12-17 DIAGNOSIS — E034 Atrophy of thyroid (acquired): Secondary | ICD-10-CM | POA: Diagnosis not present

## 2023-12-17 LAB — CBC
HCT: 30.5 % — ABNORMAL LOW (ref 39.0–52.0)
Hemoglobin: 10.6 g/dL — ABNORMAL LOW (ref 13.0–17.0)
MCH: 36.1 pg — ABNORMAL HIGH (ref 26.0–34.0)
MCHC: 34.8 g/dL (ref 30.0–36.0)
MCV: 103.7 fL — ABNORMAL HIGH (ref 80.0–100.0)
Platelets: 110 10*3/uL — ABNORMAL LOW (ref 150–400)
RBC: 2.94 MIL/uL — ABNORMAL LOW (ref 4.22–5.81)
RDW: 13.9 % (ref 11.5–15.5)
WBC: 3.9 10*3/uL — ABNORMAL LOW (ref 4.0–10.5)
nRBC: 0 % (ref 0.0–0.2)

## 2023-12-17 LAB — LIPID PANEL
Cholesterol: 118 mg/dL (ref 0–200)
HDL: 29 mg/dL — ABNORMAL LOW (ref >40)
LDL Cholesterol: 45 mg/dL (ref 0–99)
Total CHOL/HDL Ratio: 4.1 {ratio}
Triglycerides: 222 mg/dL — ABNORMAL HIGH (ref <150)
VLDL: 44 mg/dL — ABNORMAL HIGH (ref 0–40)

## 2023-12-17 LAB — HEMOGLOBIN A1C
Hgb A1c MFr Bld: 6.6 % — ABNORMAL HIGH (ref 4.8–5.6)
Mean Plasma Glucose: 142.72 mg/dL

## 2023-12-17 LAB — TSH: TSH: 10.663 u[IU]/mL — ABNORMAL HIGH (ref 0.350–4.500)

## 2023-12-17 NOTE — Progress Notes (Unsigned)
NURSING HOME LOCATION:  [facility] ROOM NUMBER:  112 D  CODE STATUS:  Full Code  PCP: Sharee Holster, NP   This is a nursing facility follow up visit for specific acute issue of abnormal lab results..  Interim medical record and care since last SNF visit was updated with review of diagnostic studies and change in clinical status since last visit were documented.  HPI: Labs performed including an A1c with a value of 6.6% which would be an estimated average glucose of 143.  He is on insulin glargine 8 units at bedtime.  He is on a relatively high dose of L-thyroxine 125 mcg daily but is TSH is 10.66.  He has been on this dose since 10/19/2023.  On 09/07/2023 his TSH again 3.852, therapeutic.  He exhibits pancytopenia with a white count of 3900, H/H of 10.6/30.5, and platelet count 110,000.  Indices are macrocytic with an MCV of 103.7.  He is on 1000 mcg of B12 daily.  Macrocytosis has been a consistent finding; on 06/11/2023 MCV was 102.3.  The most recent B12 level on record was 272 on 11/22/2021.  On 06/13/2023 H/H was 13/39.2.  Platelet count at that time was 88,000. Lipid profile revealed triglycerides of 222 and LDL 45.  He is on rosuvastatin 20 mg daily.  LDL is at goal of less than 70.Marland Kitchen  Review of systems: Despite the progression of the anemia; he denies any bleeding dyscrasias.  He validates he has had some weight gain but denies constipation, cold intolerance, or other symptoms of hypothyroidism.  Constitutional: No fever, significant weight change, fatigue  Eyes: No redness, discharge, pain, vision change ENT/mouth: No nasal congestion,  purulent discharge, earache, change in hearing, sore throat  Cardiovascular: No chest pain, palpitations, paroxysmal nocturnal dyspnea, claudication, edema  Respiratory: No cough, sputum production, hemoptysis, DOE, significant snoring, apnea   Gastrointestinal: No heartburn, dysphagia, abdominal pain, nausea /vomiting, rectal  bleeding, melena, change in bowels Genitourinary: No dysuria, hematuria, pyuria, incontinence, nocturia Musculoskeletal: No joint stiffness, joint swelling, weakness, pain Dermatologic: No rash, pruritus, change in appearance of skin Neurologic: No dizziness, headache, syncope, seizures, numbness, tingling Psychiatric: No significant anxiety, depression, insomnia, anorexia Endocrine: No change in hair/skin/nails, excessive thirst, excessive hunger, excessive urination  Hematologic/lymphatic: No significant bruising, lymphadenopathy, abnormal bleeding Allergy/immunology: No itchy/watery eyes, significant sneezing, urticaria, angioedema  Physical exam:  Pertinent or positive findings: He exhibits a hyponasal somewhat garbled speech pattern.  He has extreme plaque formation of the teeth diffusely.  No palpable thyroid abnormalities are noted.  Breath sounds are decreased.  He has a grade 1/2 systolic murmur.  The second heart sound slightly accentuated.  Abdomen is protuberant.  Pedal pulses are not palpable.  Trace edema is noted distally.  He has a fine tremor of the right thumb.  There is a palpable abnormality of the right lateral knee area.DTRs are 0-1/2+. General appearance: Adequately nourished; no acute distress, increased work of breathing is present.   Lymphatic: No lymphadenopathy about the head, neck, axilla. Eyes: No conjunctival inflammation or lid edema is present. There is no scleral icterus. Ears:  External ear exam shows no significant lesions or deformities.   Nose:  External nasal examination shows no deformity or inflammation. Nasal mucosa are pink and moist without lesions, exudates Oral exam:  Lips and gums are healthy appearing. There is no oropharyngeal erythema or exudate. Neck:  No thyromegaly, masses, tenderness noted.    Heart:  Normal rate and regular rhythm. S1  and S2 normal without gallop, murmur, click, rub .  Lungs: Chest clear to auscultation without wheezes,  rhonchi, rales, rubs. Abdomen: Bowel sounds are normal. Abdomen is soft and nontender with no organomegaly, hernias, masses. GU: Deferred  Extremities:  No cyanosis, clubbing, edema  Neurologic exam : Cn 2-7 intact Strength equal  in upper & lower extremities Balance, Rhomberg, finger to nose testing could not be completed due to clinical state Deep tendon reflexes are equal Skin: Warm & dry w/o tenting. No significant lesions or rash.  See summary under each active problem in the Problem List with associated updated therapeutic plan

## 2023-12-17 NOTE — Patient Instructions (Signed)
 See assessment and plan under each diagnosis in the problem list and acutely for this visit

## 2023-12-17 NOTE — Assessment & Plan Note (Signed)
Thrombocytopenia persists but is improved, up from 88,000  to 110,000.  No bleeding dyscrasias reported. Continue monitor.

## 2023-12-17 NOTE — Assessment & Plan Note (Signed)
Mild hypertriglyceridemia persists with a value of 222.  The LDL is at goal of less than 70 with a value of 45.  He will continue on the rosuvastatin 20 mg daily.

## 2023-12-17 NOTE — Assessment & Plan Note (Addendum)
On 125 mcg of L-thyroxine TSH is 10.66.  L-thyroxine will be increased 150 mcg daily with repeat TSH with B12 level in 10 weeks (Monday 02/22/24).

## 2023-12-17 NOTE — Assessment & Plan Note (Deleted)
Current A1c is 6.6% which indicates excellent control on insulin glargine 8 units at bedtime.  No change indicated; recheck A1c in 4 months.

## 2023-12-17 NOTE — Assessment & Plan Note (Signed)
Current A1c is 6.6% which indicates excellent control on insulin glargine 8 units at bedtime.  No change indicated; recheck A1c in 4 months.

## 2023-12-17 NOTE — Assessment & Plan Note (Signed)
Macrocytosis persists and is actually increased with an MCV of 103.7.  He is on 1000 mcg of B12 daily.  The most recent B12 level was 272 on 11/22/2021.  B12 level should be updated to make sure he is adequately absorbing it. If not, nasal or IM B12 would be appropriate.

## 2023-12-17 NOTE — Assessment & Plan Note (Signed)
Current H/H is 10.6/30.5.  On 06/13/2023 H/H was 13/39.2 he denies any bleeding dyscrasias.  B12 deficiency due to inadequate oral absorption may be playing a component.  This will be assessed with recheck of B12 level.

## 2023-12-18 ENCOUNTER — Encounter: Payer: Self-pay | Admitting: Internal Medicine

## 2023-12-18 DIAGNOSIS — N250 Renal osteodystrophy: Secondary | ICD-10-CM | POA: Diagnosis not present

## 2023-12-18 DIAGNOSIS — N186 End stage renal disease: Secondary | ICD-10-CM | POA: Diagnosis not present

## 2023-12-18 DIAGNOSIS — Z992 Dependence on renal dialysis: Secondary | ICD-10-CM | POA: Diagnosis not present

## 2023-12-18 DIAGNOSIS — D509 Iron deficiency anemia, unspecified: Secondary | ICD-10-CM | POA: Diagnosis not present

## 2023-12-18 DIAGNOSIS — N2581 Secondary hyperparathyroidism of renal origin: Secondary | ICD-10-CM | POA: Diagnosis not present

## 2023-12-18 DIAGNOSIS — D631 Anemia in chronic kidney disease: Secondary | ICD-10-CM | POA: Diagnosis not present

## 2023-12-18 DIAGNOSIS — Z8781 Personal history of (healed) traumatic fracture: Secondary | ICD-10-CM | POA: Insufficient documentation

## 2023-12-18 LAB — PSA: Prostatic Specific Antigen: 8.02 ng/mL — ABNORMAL HIGH (ref 0.00–4.00)

## 2023-12-19 DIAGNOSIS — N250 Renal osteodystrophy: Secondary | ICD-10-CM | POA: Diagnosis not present

## 2023-12-19 DIAGNOSIS — D631 Anemia in chronic kidney disease: Secondary | ICD-10-CM | POA: Diagnosis not present

## 2023-12-19 DIAGNOSIS — N186 End stage renal disease: Secondary | ICD-10-CM | POA: Diagnosis not present

## 2023-12-19 DIAGNOSIS — Z992 Dependence on renal dialysis: Secondary | ICD-10-CM | POA: Diagnosis not present

## 2023-12-19 DIAGNOSIS — D509 Iron deficiency anemia, unspecified: Secondary | ICD-10-CM | POA: Diagnosis not present

## 2023-12-19 DIAGNOSIS — N2581 Secondary hyperparathyroidism of renal origin: Secondary | ICD-10-CM | POA: Diagnosis not present

## 2023-12-21 ENCOUNTER — Encounter: Payer: Self-pay | Admitting: Family Medicine

## 2023-12-21 ENCOUNTER — Non-Acute Institutional Stay: Payer: Self-pay | Admitting: Family Medicine

## 2023-12-21 DIAGNOSIS — R972 Elevated prostate specific antigen [PSA]: Secondary | ICD-10-CM

## 2023-12-21 NOTE — Progress Notes (Signed)
 Location:  [facility]   Place of Service:      CODE STATUS: Full code  No Known Allergies  Chief Complaint  Patient presents with   PSA elevation     HPI:  Zachary [name] is a 78 yo with history of BPH, urinary retention, colon cancer s/p colectomy presenting for elevated PSA.  The patient provides some history. He reports he still makes urine---per nursing intermittently urinates. Denies hematuria, difficulty urinating. Patient has a history of BPH on Flomax. Remote history of urinary retention. PSA was checked as part of screening labs for cancer and returned elevated at 8.02. Previously in normal range of 2.79   Past Medical History:  Diagnosis Date   Abnormal CT scan, kidney 10/06/2011   Acute pyelonephritis 10/07/2011   Anemia    normocytic   Anxiety    mental retardation   Bladder wall thickening 10/06/2011   BPH (benign prostatic hypertrophy)    Diabetes mellitus    Dialysis patient (HCC)    Tuesday, Thursday and Saturday,    DVT of leg (deep venous thrombosis) (HCC) 12/25/2016   Edema     history of lower extremity edema   GERD (gastroesophageal reflux disease)    Heme positive stool    Hydronephrosis    Hyperkalemia    Hyperlipidemia    Hypernatremia    Hypertension    Hypothyroidism    Impaired speech    Infected prosthetic vascular graft (HCC)    MR (mental retardation)    Muscle weakness    Obstructive uropathy    Perinephric abscess 10/07/2011   Poor historian poor historian   Primary colorectal adenocarcinoma (HCC) 04/2017   S/p total colectomy   Protein calorie malnutrition (HCC)    Pyelonephritis    Renal failure (ARF), acute on chronic (HCC)    Renal insufficiency    chronic history   Sepsis (HCC)    Smoking    Uremia    Urinary retention    UTI (lower urinary tract infection) 10/06/2011    Past Surgical History:  Procedure Laterality Date   A/V FISTULAGRAM N/A 08/13/2018   Procedure: A/V FISTULAGRAM - Right Upper;   Surgeon: Sherren Kerns, MD;  Location: [facility] INVASIVE CV LAB;  Service: Cardiovascular;  Laterality: N/A;   A/V FISTULAGRAM N/A 11/22/2018   Procedure: A/V FISTULAGRAM - Right Upper;  Surgeon: Maeola Harman, MD;  Location: [facility] INVASIVE CV LAB;  Service: Cardiovascular;  Laterality: N/A;   A/V FISTULAGRAM Left 10/08/2022   Procedure: A/V Fistulagram;  Surgeon: Victorino Sparrow, MD;  Location: [facility] INVASIVE CV LAB;  Service: Cardiovascular;  Laterality: Left;   A/V FISTULAGRAM Left 05/06/2023   Procedure: A/V Fistulagram;  Surgeon: Leonie Douglas, MD;  Location: [facility] INVASIVE CV LAB;  Service: Cardiovascular;  Laterality: Left;   AV FISTULA PLACEMENT Left 07/06/2015   Procedure:  INSERTION LEFT ARM ARTERIOVENOUS GORTEX GRAFT;  Surgeon: Chuck Hint, MD;  Location: [facility] OR;  Service: Vascular;  Laterality: Left;   AV FISTULA PLACEMENT Right 02/26/2016   Procedure: ARTERIOVENOUS (AV) FISTULA CREATION ;  Surgeon: Chuck Hint, MD;  Location: [facility] OR;  Service: Vascular;  Laterality: Right;   AV FISTULA PLACEMENT Right 11/25/2018   Procedure: INSERTION OF ARTERIOVENOUS (AV) ARTEGRAFT RIGHT UPPER ARM;  Surgeon: Maeola Harman, MD;  Location: [facility] OR;  Service: Vascular;  Laterality: Right;   AV FISTULA PLACEMENT Left 05/28/2021   Procedure: LEFT ARM ARTERIOVENOUS (AV) FISTULA;  Surgeon: Larina Earthly, MD;  Location: [facility] ORS;  Service: Vascular;  Laterality: Left;   AVGG REMOVAL Left 10/09/2015   Procedure: REMOVAL OF ARTERIOVENOUS GORETEX GRAFT (AVGG) Evacuation of Lymphocele, Vein Patch angioplasty of brachial artery.;  Surgeon: Chuck Hint, MD;  Location: [facility] OR;  Service: Vascular;  Laterality: Left;   BASCILIC VEIN TRANSPOSITION Right 02/26/2016   Procedure: Right BASCILIC VEIN TRANSPOSITION;  Surgeon: Chuck Hint, MD;  Location: [facility] OR;  Service: Vascular;  Laterality: Right;   BASCILIC VEIN TRANSPOSITION Left 07/30/2021   Procedure: LEFT ARM SECOND  STAGE BASILIC VEIN TRANSPOSITION;  Surgeon: Larina Earthly, MD;  Location: [facility];  Service: Vascular;  Laterality: Left;   CIRCUMCISION N/A 01/04/2014   Procedure: CIRCUMCISION ADULT (procedure #1);  Surgeon: Ky Barban, MD;  Location: [facility];  Service: Urology;  Laterality: N/A;   COLECTOMY N/A 05/04/2017   Procedure: TOTAL COLECTOMY;  Surgeon: Franky Macho, MD;  Location: [facility] OR[facility];  Service: General;  Laterality: N/A;   COLONOSCOPY N/A 04/27/2017   Surgeon: Corbin Ade, MD; annular mass in the ascending colon likely representing cancer biopsied, multiple 6-22 mm polyps removed, clean rectum.  Pathology with multiple tubular adenomas, high-grade dysplasia noted in ascending colon and splenic flexure.   CYSTOSCOPY W/ RETROGRADES Bilateral 06/29/2015   Procedure: CYSTOSCOPY, DILATION OF URETHRAL STRICTURE WITH BILATERAL RETROGRADE PYELOGRAM,SUPRAPUBIC TUBE CHANGE;  Surgeon: Jerilee Field, MD;  Location: [facility] ORS;  Service: Urology;  Laterality: Bilateral;   CYSTOSCOPY WITH URETHRAL DILATATION N/A 12/29/2013   Procedure: CYSTOSCOPY WITH URETHRAL DILATATION;  Surgeon: Ky Barban, MD;  Location: [facility];  Service: Urology;  Laterality: N/A;   ESOPHAGOGASTRODUODENOSCOPY N/A 04/27/2017   Procedure: ESOPHAGOGASTRODUODENOSCOPY (EGD);  Surgeon: Corbin Ade, MD;  Location: [facility];  Service: Endoscopy;  Laterality: N/A;   FLEXIBLE SIGMOIDOSCOPY N/A 12/02/2021   Procedure: FLEXIBLE SIGMOIDOSCOPY;  Surgeon: Lanelle Bal, DO;  Location: [facility];  Service: Endoscopy;  Laterality: N/A;  10:30am, dialysis patient   INSERTION OF DIALYSIS CATHETER Right 11/25/2018   Procedure: INSERTION OF DIALYSIS CATHETER RIGHT INTERNAL JUGULAR;  Surgeon: Maeola Harman, MD;  Location: [facility] OR;  Service: Vascular;  Laterality: Right;   IR AV DIALY SHUNT INTRO NEEDLE/INTRACATH INITIAL W/PTA/IMG RIGHT Right 09/07/2018   IR FLUORO GUIDE CV LINE RIGHT  10/16/2020   IR FLUORO GUIDE CV LINE  RIGHT  10/03/2022   IR REMOVAL TUN CV CATH W/O FL  01/12/2019   IR THROMBECTOMY AV FISTULA W/THROMBOLYSIS/PTA INC/SHUNT/IMG RIGHT Right 04/26/2018   IR US GUIDE VASC ACCESS RIGHT  04/26/2018   IR US GUIDE VASC ACCESS RIGHT  09/07/2018   IR US GUIDE VASC ACCESS RIGHT  10/16/2020   IR US GUIDE VASC ACCESS RIGHT  10/16/2020   IR US GUIDE VASC ACCESS RIGHT  10/03/2022   ORIF FEMUR FRACTURE Right 11/22/2016   Procedure: OPEN REDUCTION INTERNAL FIXATION (ORIF) DISTAL FEMUR FRACTURE;  Surgeon: Samson Frederic, MD;  Location: [facility] OR;  Service: Orthopedics;  Laterality: Right;   PATCH ANGIOPLASTY Right 12/10/2017   Procedure: PATCH ANGIOPLASTY;  Surgeon: Chuck Hint, MD;  Location: [facility] OR;  Service: Vascular;  Laterality: Right;   PERIPHERAL VASCULAR BALLOON ANGIOPLASTY  08/13/2018   Procedure: PERIPHERAL VASCULAR BALLOON ANGIOPLASTY;  Surgeon: Sherren Kerns, MD;  Location: [facility] INVASIVE CV LAB;  Service: Cardiovascular;;  right AV fistula    PERIPHERAL VASCULAR BALLOON ANGIOPLASTY  11/22/2018   Procedure: PERIPHERAL VASCULAR BALLOON ANGIOPLASTY;  Surgeon: Maeola Harman, MD;  Location: [facility] INVASIVE CV LAB;  Service:  Cardiovascular;;  rt AV fistula   PERIPHERAL VASCULAR BALLOON ANGIOPLASTY  10/08/2022   Procedure: PERIPHERAL VASCULAR BALLOON ANGIOPLASTY;  Surgeon: Victorino Sparrow, MD;  Location: [facility] INVASIVE CV LAB;  Service: Cardiovascular;;  arterial anastamosis   PERIPHERAL VASCULAR CATHETERIZATION N/A 10/08/2015   Procedure: A/V Shuntogram;  Surgeon: Chuck Hint, MD;  Location: [facility] INVASIVE CV LAB;  Service: Cardiovascular;  Laterality: N/A;   PERIPHERAL VASCULAR INTERVENTION Left 10/08/2022   Procedure: PERIPHERAL VASCULAR INTERVENTION;  Surgeon: Victorino Sparrow, MD;  Location: [facility] INVASIVE CV LAB;  Service: Cardiovascular;  Laterality: Left;  left AVF   REMOVAL OF A DIALYSIS CATHETER N/A 12/03/2021   Procedure: MINOR REMOVAL OF A TUNNELED DIALYSIS CATHETER;  Surgeon:  Larina Earthly, MD;  Location: [facility];  Service: Vascular;  Laterality: N/A;   REMOVAL OF A DIALYSIS CATHETER N/A 11/18/2022   Procedure: MINOR REMOVAL OF A TUNNELED DIALYSIS CATHETER;  Surgeon: Larina Earthly, MD;  Location: [facility];  Service: Vascular;  Laterality: N/A;   THROMBECTOMY W/ EMBOLECTOMY Right 12/10/2017   Procedure: THROMBECTOMY REVISION RIGHT ARM  ARTERIOVENOUS FISTULA;  Surgeon: Chuck Hint, MD;  Location: [facility] OR;  Service: Vascular;  Laterality: Right;   TRANSURETHRAL RESECTION OF PROSTATE N/A 01/04/2014   Procedure: TRANSURETHRAL RESECTION OF THE PROSTATE (TURP) (procedure #2);  Surgeon: Ky Barban, MD;  Location: [facility];  Service: Urology;  Laterality: N/A;    Social History   Socioeconomic History   Marital status: Single    Spouse name: Not on file   Number of children: Not on file   Years of education: Not on file   Highest education level: Not on file  Occupational History   Occupation: retired   Tobacco Use   Smoking status: Never   Smokeless tobacco: Never  Vaping Use   Vaping status: Never Used  Substance and Sexual Activity   Alcohol use: No   Drug use: No   Sexual activity: Not Currently  Other Topics Concern   Not on file  Social History Narrative   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   Long term resident of [facility]    Social Drivers of Health   Financial Resource Strain: Low Risk  (02/18/2018)   Overall Financial Resource Strain (CARDIA)    Difficulty of Paying Living Expenses: Not hard at all  Food Insecurity: No Food Insecurity (02/18/2018)   Hunger Vital Sign    Worried About Running Out of Food in the Last Year: Never true    Ran Out of Food in the Last Year: Never true  Transportation Needs: No Transportation Needs (02/18/2018)   PRAPARE - Administrator, Civil Service (Medical): No    Lack of Transportation (Non-Medical): No  Physical Activity: Unknown (09/15/2019)   Exercise  Vital Sign    Days of Exercise per Week: Not on file    Minutes of Exercise per Session: 0 min  Stress: No Stress Concern Present (02/18/2018)   [employer] of Occupational Health - Occupational Stress Questionnaire    Feeling of Stress : Not at all  Social Connections: Unknown (09/15/2019)   Social Connection and Isolation Panel [NHANES]    Frequency of Communication with Friends and Family: Not on file    Frequency of Social Gatherings with Friends and Family: Never    Attends Religious Services: Not on file    Active Member of Clubs or Organizations: Not on file    Attends Banker Meetings: Not on  file    Marital Status: Not on file  Intimate Partner Violence: Not At Risk (02/18/2018)   Humiliation, Afraid, Rape, and Kick questionnaire    Fear of Current or Ex-Partner: No    Emotionally Abused: No    Physically Abused: No    Sexually Abused: No   Family History  Problem Relation Age of Onset   Cancer Mother    Colon cancer Neg Hx       VITAL SIGNS BP 137/78   Temp (!) 97 F (36.1 C)   Resp 14   Wt 208 lb (94.3 kg)   SpO2 97%   BMI 33.57 kg/m   Outpatient Encounter Medications as of 12/21/2023  Medication Sig   aspirin EC 81 MG tablet Take 81 mg by mouth daily. 0900   Insulin Glargine (BASAGLAR KWIKPEN) 100 UNIT/ML Inject 8 Units into the skin at bedtime. 2200   Insulin Pen Needle (BD AUTOSHIELD DUO) 30G X 5 MM MISC by Does not apply route. 3/16"   levothyroxine (SYNTHROID) 125 MCG tablet Take 125 mcg by mouth at bedtime. 10 am, Special Instructions: Must be given (1) hour apart from food or other medications per pharmacy recommendations   midodrine (PROAMATINE) 10 MG tablet Take 10 mg by mouth See admin instructions. Send med with resident on  Tues/Thurs/Sat to dialysis. do not give at [facility], give to resident to take with him. send with 5mg  to equal 15mg . 6:00   midodrine (PROAMATINE) 5 MG tablet Take 5 mg by mouth See admin instructions. Send  with resident on dialysis days.  Tues/Thurs/Sat  Take along with 10 mg to equal 15 mg 6:00   NON FORMULARY Diet: Regular diet   Nutritional Supplements (,FEEDING SUPPLEMENT, PROSOURCE PLUS) liquid Take 30 mLs by mouth 2 (two) times daily between meals. 1600 and 2200   omega-3 acid ethyl esters (LOVAZA) 1 g capsule Take 2 g by mouth at bedtime. 2200   omeprazole (PRILOSEC) 40 MG capsule Take 40 mg by mouth at bedtime. 2200   polyethylene glycol (MIRALAX / GLYCOLAX) packet Take 17 g by mouth daily as needed for mild constipation.    rosuvastatin (CRESTOR) 20 MG tablet Take 20 mg by mouth daily.   sevelamer carbonate (RENVELA) 800 MG tablet Take 3 tablets (2,400 mg total) by mouth 3 (three) times daily with meals.   tamsulosin (FLOMAX) 0.4 MG CAPS capsule Take 0.4 mg by mouth at bedtime. Give 30 minutes after a meal. Do not crush or chew   torsemide (DEMADEX) 20 MG tablet Take 20 mg by mouth See admin instructions. 20mg  daily at 2000 on Sunday, Monday, Wednesday, and Friday only.   vitamin B-12 (CYANOCOBALAMIN) 1000 MCG tablet Take 1,000 mcg by mouth at bedtime.   No facility-administered encounter medications on file as of 12/21/2023.   Pleasant answers simple questions RRR Lungs clear Abdomen soft  No edema   SIGNIFICANT DIAGNOSTIC EXAMS       ASSESSMENT/ PLAN:  Assessment & Plan Elevated PSA Updated patient on this PSA level Possibly due to BPH Will refer to Urology for monitoring---given ESRD status, the patient is unlikely to have significant benefit from intervention       Synthia Innocent NP [facility] Adult Medicine  Contact [phone] Monday through Friday 8am- 5pm  After hours call [phone]

## 2023-12-22 DIAGNOSIS — N250 Renal osteodystrophy: Secondary | ICD-10-CM | POA: Diagnosis not present

## 2023-12-22 DIAGNOSIS — N2581 Secondary hyperparathyroidism of renal origin: Secondary | ICD-10-CM | POA: Diagnosis not present

## 2023-12-22 DIAGNOSIS — N186 End stage renal disease: Secondary | ICD-10-CM | POA: Diagnosis not present

## 2023-12-22 DIAGNOSIS — D631 Anemia in chronic kidney disease: Secondary | ICD-10-CM | POA: Diagnosis not present

## 2023-12-22 DIAGNOSIS — D509 Iron deficiency anemia, unspecified: Secondary | ICD-10-CM | POA: Diagnosis not present

## 2023-12-22 DIAGNOSIS — Z992 Dependence on renal dialysis: Secondary | ICD-10-CM | POA: Diagnosis not present

## 2023-12-24 DIAGNOSIS — N186 End stage renal disease: Secondary | ICD-10-CM | POA: Diagnosis not present

## 2023-12-24 DIAGNOSIS — D509 Iron deficiency anemia, unspecified: Secondary | ICD-10-CM | POA: Diagnosis not present

## 2023-12-24 DIAGNOSIS — D631 Anemia in chronic kidney disease: Secondary | ICD-10-CM | POA: Diagnosis not present

## 2023-12-24 DIAGNOSIS — Z992 Dependence on renal dialysis: Secondary | ICD-10-CM | POA: Diagnosis not present

## 2023-12-24 DIAGNOSIS — N2581 Secondary hyperparathyroidism of renal origin: Secondary | ICD-10-CM | POA: Diagnosis not present

## 2023-12-24 DIAGNOSIS — N250 Renal osteodystrophy: Secondary | ICD-10-CM | POA: Diagnosis not present

## 2023-12-25 DIAGNOSIS — N186 End stage renal disease: Secondary | ICD-10-CM | POA: Diagnosis not present

## 2023-12-25 DIAGNOSIS — Z992 Dependence on renal dialysis: Secondary | ICD-10-CM | POA: Diagnosis not present

## 2023-12-26 DIAGNOSIS — D631 Anemia in chronic kidney disease: Secondary | ICD-10-CM | POA: Diagnosis not present

## 2023-12-26 DIAGNOSIS — N2581 Secondary hyperparathyroidism of renal origin: Secondary | ICD-10-CM | POA: Diagnosis not present

## 2023-12-26 DIAGNOSIS — N250 Renal osteodystrophy: Secondary | ICD-10-CM | POA: Diagnosis not present

## 2023-12-26 DIAGNOSIS — Z992 Dependence on renal dialysis: Secondary | ICD-10-CM | POA: Diagnosis not present

## 2023-12-26 DIAGNOSIS — D509 Iron deficiency anemia, unspecified: Secondary | ICD-10-CM | POA: Diagnosis not present

## 2023-12-26 DIAGNOSIS — N186 End stage renal disease: Secondary | ICD-10-CM | POA: Diagnosis not present

## 2023-12-27 DIAGNOSIS — N186 End stage renal disease: Secondary | ICD-10-CM | POA: Diagnosis not present

## 2023-12-27 DIAGNOSIS — Z992 Dependence on renal dialysis: Secondary | ICD-10-CM | POA: Diagnosis not present

## 2023-12-27 DIAGNOSIS — N2581 Secondary hyperparathyroidism of renal origin: Secondary | ICD-10-CM | POA: Diagnosis not present

## 2023-12-27 DIAGNOSIS — D509 Iron deficiency anemia, unspecified: Secondary | ICD-10-CM | POA: Diagnosis not present

## 2023-12-27 DIAGNOSIS — D631 Anemia in chronic kidney disease: Secondary | ICD-10-CM | POA: Diagnosis not present

## 2023-12-27 DIAGNOSIS — N250 Renal osteodystrophy: Secondary | ICD-10-CM | POA: Diagnosis not present

## 2023-12-29 DIAGNOSIS — D631 Anemia in chronic kidney disease: Secondary | ICD-10-CM | POA: Diagnosis not present

## 2023-12-29 DIAGNOSIS — Z992 Dependence on renal dialysis: Secondary | ICD-10-CM | POA: Diagnosis not present

## 2023-12-29 DIAGNOSIS — D509 Iron deficiency anemia, unspecified: Secondary | ICD-10-CM | POA: Diagnosis not present

## 2023-12-29 DIAGNOSIS — N250 Renal osteodystrophy: Secondary | ICD-10-CM | POA: Diagnosis not present

## 2023-12-29 DIAGNOSIS — N2581 Secondary hyperparathyroidism of renal origin: Secondary | ICD-10-CM | POA: Diagnosis not present

## 2023-12-29 DIAGNOSIS — N186 End stage renal disease: Secondary | ICD-10-CM | POA: Diagnosis not present

## 2023-12-31 DIAGNOSIS — N186 End stage renal disease: Secondary | ICD-10-CM | POA: Diagnosis not present

## 2023-12-31 DIAGNOSIS — Z992 Dependence on renal dialysis: Secondary | ICD-10-CM | POA: Diagnosis not present

## 2023-12-31 DIAGNOSIS — D509 Iron deficiency anemia, unspecified: Secondary | ICD-10-CM | POA: Diagnosis not present

## 2023-12-31 DIAGNOSIS — N250 Renal osteodystrophy: Secondary | ICD-10-CM | POA: Diagnosis not present

## 2023-12-31 DIAGNOSIS — N2581 Secondary hyperparathyroidism of renal origin: Secondary | ICD-10-CM | POA: Diagnosis not present

## 2023-12-31 DIAGNOSIS — D631 Anemia in chronic kidney disease: Secondary | ICD-10-CM | POA: Diagnosis not present

## 2024-01-02 DIAGNOSIS — D509 Iron deficiency anemia, unspecified: Secondary | ICD-10-CM | POA: Diagnosis not present

## 2024-01-02 DIAGNOSIS — D631 Anemia in chronic kidney disease: Secondary | ICD-10-CM | POA: Diagnosis not present

## 2024-01-02 DIAGNOSIS — N250 Renal osteodystrophy: Secondary | ICD-10-CM | POA: Diagnosis not present

## 2024-01-02 DIAGNOSIS — N2581 Secondary hyperparathyroidism of renal origin: Secondary | ICD-10-CM | POA: Diagnosis not present

## 2024-01-02 DIAGNOSIS — N186 End stage renal disease: Secondary | ICD-10-CM | POA: Diagnosis not present

## 2024-01-02 DIAGNOSIS — Z992 Dependence on renal dialysis: Secondary | ICD-10-CM | POA: Diagnosis not present

## 2024-01-04 NOTE — Addendum Note (Signed)
 Addended by: Sharee Holster on: 01/04/2024 01:04 PM   Modules accepted: Level of Service

## 2024-01-05 ENCOUNTER — Encounter: Payer: Self-pay | Admitting: Adult Health

## 2024-01-05 ENCOUNTER — Non-Acute Institutional Stay (SKILLED_NURSING_FACILITY): Payer: Self-pay | Admitting: Adult Health

## 2024-01-05 DIAGNOSIS — N250 Renal osteodystrophy: Secondary | ICD-10-CM | POA: Diagnosis not present

## 2024-01-05 DIAGNOSIS — F321 Major depressive disorder, single episode, moderate: Secondary | ICD-10-CM

## 2024-01-05 DIAGNOSIS — D631 Anemia in chronic kidney disease: Secondary | ICD-10-CM

## 2024-01-05 DIAGNOSIS — D509 Iron deficiency anemia, unspecified: Secondary | ICD-10-CM | POA: Diagnosis not present

## 2024-01-05 DIAGNOSIS — Z794 Long term (current) use of insulin: Secondary | ICD-10-CM

## 2024-01-05 DIAGNOSIS — E034 Atrophy of thyroid (acquired): Secondary | ICD-10-CM

## 2024-01-05 DIAGNOSIS — N2581 Secondary hyperparathyroidism of renal origin: Secondary | ICD-10-CM | POA: Diagnosis not present

## 2024-01-05 DIAGNOSIS — N186 End stage renal disease: Secondary | ICD-10-CM

## 2024-01-05 DIAGNOSIS — Z992 Dependence on renal dialysis: Secondary | ICD-10-CM | POA: Diagnosis not present

## 2024-01-05 NOTE — Progress Notes (Unsigned)
 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 He continues dialysis 3 days per week. There are no reports of uncontrolled pain. His weight is stable.   Past Medical History:  Diagnosis Date   Abnormal CT scan, kidney 10/06/2011   Acute pyelonephritis 10/07/2011   Anemia    normocytic   Anxiety    mental retardation   Bladder wall thickening 10/06/2011   BPH (benign prostatic hypertrophy)    Diabetes mellitus    Dialysis patient (HCC)    Tuesday, Thursday and Saturday,    DVT of leg (deep venous thrombosis) (HCC) 12/25/2016   Edema     history of lower extremity edema   GERD (gastroesophageal reflux disease)    Heme positive stool    Hydronephrosis    Hyperkalemia    Hyperlipidemia    Hypernatremia    Hypertension    Hypothyroidism    Impaired speech    Infected prosthetic vascular graft (HCC)    MR (mental retardation)    Muscle weakness    Obstructive uropathy    Perinephric abscess 10/07/2011   Poor historian poor historian   Primary colorectal adenocarcinoma (HCC) 04/2017   S/p total colectomy   Protein calorie malnutrition (HCC)    Pyelonephritis    Renal failure (ARF), acute on chronic (HCC)    Renal insufficiency    chronic history   Sepsis (HCC)    Smoking    Uremia    Urinary retention    UTI (lower urinary tract infection) 10/06/2011    Past Surgical History:  Procedure Laterality Date   A/V  FISTULAGRAM N/A 08/13/2018   Procedure: A/V FISTULAGRAM - Right Upper;  Surgeon: Sherren Kerns, MD;  Location: [facility] INVASIVE CV LAB;  Service: Cardiovascular;  Laterality: N/A;   A/V FISTULAGRAM N/A 11/22/2018   Procedure: A/V FISTULAGRAM - Right Upper;  Surgeon: Maeola Harman, MD;  Location: [facility] INVASIVE CV LAB;  Service: Cardiovascular;  Laterality: N/A;   A/V FISTULAGRAM Left 10/08/2022   Procedure: A/V Fistulagram;  Surgeon: Victorino Sparrow, MD;  Location: [facility] INVASIVE CV LAB;  Service: Cardiovascular;  Laterality: Left;   A/V FISTULAGRAM Left 05/06/2023   Procedure: A/V Fistulagram;  Surgeon: Leonie Douglas, MD;  Location: [facility] INVASIVE CV LAB;  Service: Cardiovascular;  Laterality: Left;   AV FISTULA PLACEMENT Left 07/06/2015   Procedure:  INSERTION LEFT ARM ARTERIOVENOUS GORTEX GRAFT;  Surgeon: Chuck Hint, MD;  Location: [facility] OR;  Service: Vascular;  Laterality: Left;   AV FISTULA PLACEMENT Right 02/26/2016   Procedure: ARTERIOVENOUS (AV) FISTULA CREATION ;  Surgeon: Chuck Hint, MD;  Location: [facility] OR;  Service: Vascular;  Laterality: Right;   AV FISTULA PLACEMENT Right 11/25/2018   Procedure: INSERTION OF ARTERIOVENOUS (AV) ARTEGRAFT RIGHT UPPER ARM;  Surgeon: Maeola Harman, MD;  Location: [facility] OR;  Service: Vascular;  Laterality: Right;   AV FISTULA  PLACEMENT Left 05/28/2021   Procedure: LEFT ARM ARTERIOVENOUS (AV) FISTULA;  Surgeon: Larina Earthly, MD;  Location: [facility] ORS;  Service: Vascular;  Laterality: Left;   AVGG REMOVAL Left 10/09/2015   Procedure: REMOVAL OF ARTERIOVENOUS GORETEX GRAFT (AVGG) Evacuation of Lymphocele, Vein Patch angioplasty of brachial artery.;  Surgeon: Chuck Hint, MD;  Location: [facility] OR;  Service: Vascular;  Laterality: Left;   BASCILIC VEIN TRANSPOSITION Right 02/26/2016   Procedure: Right BASCILIC VEIN TRANSPOSITION;  Surgeon: Chuck Hint, MD;  Location: [facility] OR;  Service: Vascular;  Laterality: Right;    BASCILIC VEIN TRANSPOSITION Left 07/30/2021   Procedure: LEFT ARM SECOND STAGE BASILIC VEIN TRANSPOSITION;  Surgeon: Larina Earthly, MD;  Location: [facility] ORS;  Service: Vascular;  Laterality: Left;   CIRCUMCISION N/A 01/04/2014   Procedure: CIRCUMCISION ADULT (procedure #1);  Surgeon: Ky Barban, MD;  Location: [facility] ORS;  Service: Urology;  Laterality: N/A;   COLECTOMY N/A 05/04/2017   Procedure: TOTAL COLECTOMY;  Surgeon: Franky Macho, MD;  Location: [facility] ORS;  Service: General;  Laterality: N/A;   COLONOSCOPY N/A 04/27/2017   Surgeon: Corbin Ade, MD; annular mass in the ascending colon likely representing cancer biopsied, multiple 6-22 mm polyps removed, clean rectum.  Pathology with multiple tubular adenomas, high-grade dysplasia noted in ascending colon and splenic flexure.   CYSTOSCOPY W/ RETROGRADES Bilateral 06/29/2015   Procedure: CYSTOSCOPY, DILATION OF URETHRAL STRICTURE WITH BILATERAL RETROGRADE PYELOGRAM,SUPRAPUBIC TUBE CHANGE;  Surgeon: Jerilee Field, MD;  Location: [facility] ORS;  Service: Urology;  Laterality: Bilateral;   CYSTOSCOPY WITH URETHRAL DILATATION N/A 12/29/2013   Procedure: CYSTOSCOPY WITH URETHRAL DILATATION;  Surgeon: Ky Barban, MD;  Location: [facility] ORS;  Service: Urology;  Laterality: N/A;   ESOPHAGOGASTRODUODENOSCOPY N/A 04/27/2017   Procedure: ESOPHAGOGASTRODUODENOSCOPY (EGD);  Surgeon: Corbin Ade, MD;  [facility]: [facility];  Service: Endoscopy;  Laterality: N/A;   FLEXIBLE SIGMOIDOSCOPY N/A 12/02/2021   Procedure: FLEXIBLE SIGMOIDOSCOPY;  Surgeon: Lanelle Bal, DO;  Location: [facility];  Service: Endoscopy;  Laterality: N/A;  10:30am, dialysis patient   INSERTION OF DIALYSIS CATHETER Right 11/25/2018   Procedure: INSERTION OF DIALYSIS CATHETER RIGHT INTERNAL JUGULAR;  Surgeon: Maeola Harman, MD;  Location: [facility] OR;  Service: Vascular;  Laterality: Right;   IR AV DIALY SHUNT INTRO NEEDLE/INTRACATH INITIAL W/PTA/IMG RIGHT Right  09/07/2018   IR FLUORO GUIDE CV LINE RIGHT  10/16/2020   IR FLUORO GUIDE CV LINE RIGHT  10/03/2022   IR REMOVAL TUN CV CATH W/O FL  01/12/2019   IR THROMBECTOMY AV FISTULA W/THROMBOLYSIS/PTA INC/SHUNT/IMG RIGHT Right 04/26/2018   IR US GUIDE VASC ACCESS RIGHT  04/26/2018   IR US GUIDE VASC ACCESS RIGHT  09/07/2018   IR US GUIDE VASC ACCESS RIGHT  10/16/2020   IR US GUIDE VASC ACCESS RIGHT  10/16/2020   IR US GUIDE VASC ACCESS RIGHT  10/03/2022   ORIF FEMUR FRACTURE Right 11/22/2016   Procedure: OPEN REDUCTION INTERNAL FIXATION (ORIF) DISTAL FEMUR FRACTURE;  Surgeon: Samson Frederic, MD;  Location: [facility] OR;  Service: Orthopedics;  Laterality: Right;   PATCH ANGIOPLASTY Right 12/10/2017   Procedure: PATCH ANGIOPLASTY;  Surgeon: Chuck Hint, MD;  Location: [facility] OR;  Service: Vascular;  Laterality: Right;   PERIPHERAL VASCULAR BALLOON ANGIOPLASTY  08/13/2018   Procedure: PERIPHERAL VASCULAR BALLOON ANGIOPLASTY;  Surgeon: Sherren Kerns, MD;  Location: [facility] INVASIVE CV LAB;  Service: Cardiovascular;;  right AV fistula    PERIPHERAL VASCULAR BALLOON ANGIOPLASTY  11/22/2018   Procedure:  PERIPHERAL VASCULAR BALLOON ANGIOPLASTY;  Surgeon: Maeola Harman, MD;  Location: [facility] INVASIVE CV LAB;  Service: Cardiovascular;;  rt AV fistula   PERIPHERAL VASCULAR BALLOON ANGIOPLASTY  10/08/2022   Procedure: PERIPHERAL VASCULAR BALLOON ANGIOPLASTY;  Surgeon: Victorino Sparrow, MD;  Location: [facility] INVASIVE CV LAB;  Service: Cardiovascular;;  arterial anastamosis   PERIPHERAL VASCULAR CATHETERIZATION N/A 10/08/2015   Procedure: A/V Shuntogram;  Surgeon: Chuck Hint, MD;  Location: [facility] INVASIVE CV LAB;  Service: Cardiovascular;  Laterality: N/A;   PERIPHERAL VASCULAR INTERVENTION Left 10/08/2022   Procedure: PERIPHERAL VASCULAR INTERVENTION;  Surgeon: Victorino Sparrow, MD;  Location: [facility] INVASIVE CV LAB;  Service: Cardiovascular;  Laterality: Left;  left AVF   REMOVAL OF A DIALYSIS CATHETER N/A  12/03/2021   Procedure: MINOR REMOVAL OF A TUNNELED DIALYSIS CATHETER;  Surgeon: Larina Earthly, MD;  Location: [facility]S;  Service: Vascular;  Laterality: N/A;   REMOVAL OF A DIALYSIS CATHETER N/A 11/18/2022   Procedure: MINOR REMOVAL OF A TUNNELED DIALYSIS CATHETER;  Surgeon: Larina Earthly, MD;  Location: [facility];  Service: Vascular;  Laterality: N/A;   THROMBECTOMY W/ EMBOLECTOMY Right 12/10/2017   Procedure: THROMBECTOMY REVISION RIGHT ARM  ARTERIOVENOUS FISTULA;  Surgeon: Chuck Hint, MD;  Location: [facility] OR;  Service: Vascular;  Laterality: Right;   TRANSURETHRAL RESECTION OF PROSTATE N/A 01/04/2014   Procedure: TRANSURETHRAL RESECTION OF THE PROSTATE (TURP) (procedure #2);  Surgeon: Ky Barban, MD;  Location: [facility]S;  Service: Urology;  Laterality: N/A;    Social History   Socioeconomic History   Marital status: Single    Spouse name: Not on file   Number of children: Not on file   Years of education: Not on file   Highest education level: Not on file  Occupational History   Occupation: retired   Tobacco Use   Smoking status: Never   Smokeless tobacco: Never  Vaping Use   Vaping status: Never Used  Substance and Sexual Activity   Alcohol use: No   Drug use: No   Sexual activity: Not Currently  Other Topics Concern   Not on file  Social History Narrative   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   Long term resident of [facility]    Social Drivers of Health   Financial Resource Strain: Low Risk  (02/18/2018)   Overall Financial Resource Strain (CARDIA)    Difficulty of Paying Living Expenses: Not hard at all  Food Insecurity: No Food Insecurity (02/18/2018)   Hunger Vital Sign    Worried About Running Out of Food in the Last Year: Never true    Ran Out of Food in the Last Year: Never true  Transportation Needs: No Transportation Needs (02/18/2018)   PRAPARE - Administrator, Civil Service (Medical): No    Lack of  Transportation (Non-Medical): No  Physical Activity: Unknown (09/15/2019)   Exercise Vital Sign    Days of Exercise per Week: Not on file    Minutes of Exercise per Session: 0 min  Stress: No Stress Concern Present (02/18/2018)   [employer] of Occupational Health - Occupational Stress Questionnaire    Feeling of Stress : Not at all  Social Connections: Unknown (09/15/2019)   Social Connection and Isolation Panel [NHANES]    Frequency of Communication with Friends and Family: Not on file    Frequency of Social Gatherings with Friends and Family: Never    Attends Religious Services: Not on file    Active  Member of Clubs or Organizations: Not on file    Attends Club or Organization Meetings: Not on file    Marital Status: Not on file  Intimate Partner Violence: Not At Risk (02/18/2018)   Humiliation, Afraid, Rape, and Kick questionnaire    Fear of Current or Ex-Partner: No    Emotionally Abused: No    Physically Abused: No    Sexually Abused: No   Family History  Problem Relation Age of Onset   Cancer Mother    Colon cancer Neg Hx       VITAL SIGNS BP 132/70   Pulse 72   Temp (!) 97.2 F (36.2 C)   Resp 20   Ht 5\' 6"  (1.676 m)   Wt 211 lb 12.8 oz (96.1 kg)   SpO2 98%   BMI 34.19 kg/m   Outpatient Encounter Medications as of 01/05/2024  Medication Sig   aspirin EC 81 MG tablet Take 81 mg by mouth daily. 0900   Insulin Glargine (BASAGLAR KWIKPEN) 100 UNIT/ML Inject 8 Units into the skin at bedtime. 2200   Insulin Pen Needle (BD AUTOSHIELD DUO) 30G X 5 MM MISC by Does not apply route. 3/16"   levothyroxine (SYNTHROID) 125 MCG tablet Take 125 mcg by mouth at bedtime. 10 am, Special Instructions: Must be given (1) hour apart from food or other medications per pharmacy recommendations   midodrine (PROAMATINE) 10 MG tablet Take 10 mg by mouth See admin instructions. Send med with resident on  Tues/Thurs/Sat to dialysis. do not give at [facility], give to resident to take  with him. send with 5mg  to equal 15mg . 6:00   midodrine (PROAMATINE) 5 MG tablet Take 5 mg by mouth See admin instructions. Send with resident on dialysis days.  Tues/Thurs/Sat  Take along with 10 mg to equal 15 mg 6:00   NON FORMULARY Diet: Regular diet   Nutritional Supplements (,FEEDING SUPPLEMENT, PROSOURCE PLUS) liquid Take 30 mLs by mouth 2 (two) times daily between meals. 1600 and 2200   omega-3 acid ethyl esters (LOVAZA) 1 g capsule Take 2 g by mouth at bedtime. 2200   omeprazole (PRILOSEC) 40 MG capsule Take 40 mg by mouth at bedtime. 2200   polyethylene glycol (MIRALAX / GLYCOLAX) packet Take 17 g by mouth daily as needed for mild constipation.    rosuvastatin (CRESTOR) 20 MG tablet Take 20 mg by mouth daily.   sevelamer carbonate (RENVELA) 800 MG tablet Take 3 tablets (2,400 mg total) by mouth 3 (three) times daily with meals.   tamsulosin (FLOMAX) 0.4 MG CAPS capsule Take 0.4 mg by mouth at bedtime. Give 30 minutes after a meal. Do not crush or chew   torsemide (DEMADEX) 20 MG tablet Take 20 mg by mouth See admin instructions. 20mg  daily at 2000 on Sunday, Monday, Wednesday, and Friday only.   vitamin B-12 (CYANOCOBALAMIN) 1000 MCG tablet Take 1,000 mcg by mouth at bedtime.   No facility-administered encounter medications on file as of 01/05/2024.     SIGNIFICANT DIAGNOSTIC EXAMS   LABS REVIEWED PREVIOUS;      02-13-23: wbc 3.8; hgb 9.7; hct 28.7; mcv 101.1 plt 126; glucose 67; bun 18; creat 6.28; k+ 2.9; na++ 137; ca 8.8; gfr 9; protein 6.9; albumin 3.2 mag 1.8; occult blood: neg 02-23-23: hgb 9.8; hct 29.7 glucose 110; bun 24; creat 8.32 k+ 3.6; na++ 139; ca 9.6 gfr 6  06-11-23: wbc 4.4; hgb 11.7; hct 35.2; mcv 102.3 plt 88; glucose 160; bun 20; creat 4.71; k+ 3.5;  na++ 137; ca 8.4; gfr 12   06-13-23: wbc 3.6; hgb 13.0; hct 39.2; mcv 102.6 plt 110; glucose 103; bun 11; creat 4.13; k+ 3.3; na++ 138; ca 8.6; gfr 14; d-dimer 0.54; CRP <0.5  06-16-23: glucose 99; bun 40; creat 10.88; k+  4.6; na++ 136; ca 8.8; gfr 5 07-16-23: hgb A1c 5.7; chol 134; ldl 39; trig 486; hdl 24 09-03-23: tsh 3.852 free t4: 0.82 09-14-23: protein 6.9 albumin 3.2; chol 127; ldl 37 trig 420 hdl 25  NO NEW LABS.    Review of Systems  Constitutional:  Negative for malaise/fatigue.  Respiratory:  Negative for cough and shortness of breath.   Cardiovascular:  Negative for chest pain, palpitations and leg swelling.  Gastrointestinal:  Negative for abdominal pain, constipation and heartburn.  Musculoskeletal:  Negative for back pain, joint pain and myalgias.  Skin: Negative.   Neurological:  Negative for dizziness.  Psychiatric/Behavioral:  The patient is not nervous/anxious.    Physical Exam Constitutional:      General: He is not in acute distress.    Appearance: He is well-developed. He is obese. He is not diaphoretic.  Neck:     Thyroid: No thyromegaly.  Cardiovascular:     Rate and Rhythm: Normal rate and regular rhythm.     Heart sounds: Normal heart sounds.  Pulmonary:     Effort: Pulmonary effort is normal. No respiratory distress.     Breath sounds: Normal breath sounds.  Abdominal:     General: Bowel sounds are normal. There is no distension.     Palpations: Abdomen is soft.     Tenderness: There is no abdominal tenderness.  Musculoskeletal:        General: Normal range of motion.     Cervical back: Neck supple.  Lymphadenopathy:     Cervical: No cervical adenopathy.  Skin:    General: Skin is warm and dry.     Comments: Left arm fistula +thrill+bruit     Neurological:     Mental Status: He is alert. Mental status is at baseline.     Comments: SLUMS 2/30  Psychiatric:        Mood and Affect: Mood normal.       ASSESSMENT/ PLAN:  TODAY  Other specified hypothyroidism: tsh 3.852 will continue synthroid 125 mcg daily   2. 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 Major depression single episode moderate: is presently off medications.    PREVIOUS   4. Osteopenia: t score -2.052 is on supplements  5. GERD without esophagitis: is off PPI   6. History of colon cancer: s/p colectomy last seen by GI on 12-19-21; ct scan done on 12-30-21.  7. Dyslipidemia associated with type 2 diabetes mellitus: ldl 37 trig 420 will continue crestor  20 mg daily    8. Vitamin B 12 deficiency: is on daily supplements  9. Secondary hyperparathyroidism of renal origin: will monitor   10. Bilateral lower extremity edema: will continue demedex 20 mg four days per week.   11. 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 Thrombocytopenia: plt 110   13. Aortic atherosclerosis (ct 12-30-21) is on asa and statin  14. 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: on 3 days weekly; no longer on fluid restriction is unable to adhere; is on  renvela 2400 mg three times daily    Synthia Innocent NP [facility]  call [phone]

## 2024-01-07 DIAGNOSIS — Z992 Dependence on renal dialysis: Secondary | ICD-10-CM | POA: Diagnosis not present

## 2024-01-07 DIAGNOSIS — N186 End stage renal disease: Secondary | ICD-10-CM | POA: Diagnosis not present

## 2024-01-07 DIAGNOSIS — D631 Anemia in chronic kidney disease: Secondary | ICD-10-CM | POA: Diagnosis not present

## 2024-01-07 DIAGNOSIS — N250 Renal osteodystrophy: Secondary | ICD-10-CM | POA: Diagnosis not present

## 2024-01-07 DIAGNOSIS — N2581 Secondary hyperparathyroidism of renal origin: Secondary | ICD-10-CM | POA: Diagnosis not present

## 2024-01-07 DIAGNOSIS — D509 Iron deficiency anemia, unspecified: Secondary | ICD-10-CM | POA: Diagnosis not present

## 2024-01-09 DIAGNOSIS — D631 Anemia in chronic kidney disease: Secondary | ICD-10-CM | POA: Diagnosis not present

## 2024-01-09 DIAGNOSIS — N250 Renal osteodystrophy: Secondary | ICD-10-CM | POA: Diagnosis not present

## 2024-01-09 DIAGNOSIS — N186 End stage renal disease: Secondary | ICD-10-CM | POA: Diagnosis not present

## 2024-01-09 DIAGNOSIS — D509 Iron deficiency anemia, unspecified: Secondary | ICD-10-CM | POA: Diagnosis not present

## 2024-01-09 DIAGNOSIS — Z992 Dependence on renal dialysis: Secondary | ICD-10-CM | POA: Diagnosis not present

## 2024-01-09 DIAGNOSIS — N2581 Secondary hyperparathyroidism of renal origin: Secondary | ICD-10-CM | POA: Diagnosis not present

## 2024-01-12 DIAGNOSIS — N250 Renal osteodystrophy: Secondary | ICD-10-CM | POA: Diagnosis not present

## 2024-01-12 DIAGNOSIS — N186 End stage renal disease: Secondary | ICD-10-CM | POA: Diagnosis not present

## 2024-01-12 DIAGNOSIS — D509 Iron deficiency anemia, unspecified: Secondary | ICD-10-CM | POA: Diagnosis not present

## 2024-01-12 DIAGNOSIS — D631 Anemia in chronic kidney disease: Secondary | ICD-10-CM | POA: Diagnosis not present

## 2024-01-12 DIAGNOSIS — N2581 Secondary hyperparathyroidism of renal origin: Secondary | ICD-10-CM | POA: Diagnosis not present

## 2024-01-12 DIAGNOSIS — Z992 Dependence on renal dialysis: Secondary | ICD-10-CM | POA: Diagnosis not present

## 2024-01-14 DIAGNOSIS — D509 Iron deficiency anemia, unspecified: Secondary | ICD-10-CM | POA: Diagnosis not present

## 2024-01-14 DIAGNOSIS — N186 End stage renal disease: Secondary | ICD-10-CM | POA: Diagnosis not present

## 2024-01-14 DIAGNOSIS — D631 Anemia in chronic kidney disease: Secondary | ICD-10-CM | POA: Diagnosis not present

## 2024-01-14 DIAGNOSIS — Z992 Dependence on renal dialysis: Secondary | ICD-10-CM | POA: Diagnosis not present

## 2024-01-14 DIAGNOSIS — N2581 Secondary hyperparathyroidism of renal origin: Secondary | ICD-10-CM | POA: Diagnosis not present

## 2024-01-14 DIAGNOSIS — N250 Renal osteodystrophy: Secondary | ICD-10-CM | POA: Diagnosis not present

## 2024-01-16 DIAGNOSIS — D631 Anemia in chronic kidney disease: Secondary | ICD-10-CM | POA: Diagnosis not present

## 2024-01-16 DIAGNOSIS — D509 Iron deficiency anemia, unspecified: Secondary | ICD-10-CM | POA: Diagnosis not present

## 2024-01-16 DIAGNOSIS — Z992 Dependence on renal dialysis: Secondary | ICD-10-CM | POA: Diagnosis not present

## 2024-01-16 DIAGNOSIS — N250 Renal osteodystrophy: Secondary | ICD-10-CM | POA: Diagnosis not present

## 2024-01-16 DIAGNOSIS — N186 End stage renal disease: Secondary | ICD-10-CM | POA: Diagnosis not present

## 2024-01-16 DIAGNOSIS — N2581 Secondary hyperparathyroidism of renal origin: Secondary | ICD-10-CM | POA: Diagnosis not present

## 2024-01-19 DIAGNOSIS — N250 Renal osteodystrophy: Secondary | ICD-10-CM | POA: Diagnosis not present

## 2024-01-19 DIAGNOSIS — N186 End stage renal disease: Secondary | ICD-10-CM | POA: Diagnosis not present

## 2024-01-19 DIAGNOSIS — N2581 Secondary hyperparathyroidism of renal origin: Secondary | ICD-10-CM | POA: Diagnosis not present

## 2024-01-19 DIAGNOSIS — D509 Iron deficiency anemia, unspecified: Secondary | ICD-10-CM | POA: Diagnosis not present

## 2024-01-19 DIAGNOSIS — D631 Anemia in chronic kidney disease: Secondary | ICD-10-CM | POA: Diagnosis not present

## 2024-01-19 DIAGNOSIS — Z992 Dependence on renal dialysis: Secondary | ICD-10-CM | POA: Diagnosis not present

## 2024-01-21 DIAGNOSIS — N2581 Secondary hyperparathyroidism of renal origin: Secondary | ICD-10-CM | POA: Diagnosis not present

## 2024-01-21 DIAGNOSIS — Z992 Dependence on renal dialysis: Secondary | ICD-10-CM | POA: Diagnosis not present

## 2024-01-21 DIAGNOSIS — N186 End stage renal disease: Secondary | ICD-10-CM | POA: Diagnosis not present

## 2024-01-21 DIAGNOSIS — N250 Renal osteodystrophy: Secondary | ICD-10-CM | POA: Diagnosis not present

## 2024-01-21 DIAGNOSIS — D509 Iron deficiency anemia, unspecified: Secondary | ICD-10-CM | POA: Diagnosis not present

## 2024-01-21 DIAGNOSIS — D631 Anemia in chronic kidney disease: Secondary | ICD-10-CM | POA: Diagnosis not present

## 2024-01-22 ENCOUNTER — Ambulatory Visit (INDEPENDENT_AMBULATORY_CARE_PROVIDER_SITE_OTHER): Payer: Medicare Other | Admitting: Urology

## 2024-01-22 VITALS — BP 146/75 | HR 84

## 2024-01-22 DIAGNOSIS — R972 Elevated prostate specific antigen [PSA]: Secondary | ICD-10-CM

## 2024-01-22 NOTE — Progress Notes (Signed)
 01/22/2024 10:35 AM   Festus Holts 09/07/1946 [id number]  Referring provider: Sharee Holster, NP [address] [place],  [place] [address]  Elevated PSA   HPI: [name] is a 77yo here for evaluation of elevated PSA. PSA 3 years ago was 2.8 and then this year it was 8.0. He is on flomax 0.4mg  at bedtime. IPSS 8 QOl 1 on flomax 0.4mg  daily. No dysuria of hematuria. No family hx of prostate cancer. Patient has poor mobility and is in a wheelchair today.    PMH: Past Medical History:  Diagnosis Date   Abnormal CT scan, kidney 10/06/2011   Acute pyelonephritis 10/07/2011   Anemia    normocytic   Anxiety    mental retardation   Bladder wall thickening 10/06/2011   BPH (benign prostatic hypertrophy)    Diabetes mellitus    Dialysis patient (HCC)    Tuesday, Thursday and Saturday,    DVT of leg (deep venous thrombosis) (HCC) 12/25/2016   Edema     history of lower extremity edema   GERD (gastroesophageal reflux disease)    Heme positive stool    Hydronephrosis    Hyperkalemia    Hyperlipidemia    Hypernatremia    Hypertension    Hypothyroidism    Impaired speech    Infected prosthetic vascular graft (HCC)    MR (mental retardation)    Muscle weakness    Obstructive uropathy    Perinephric abscess 10/07/2011   Poor historian poor historian   Primary colorectal adenocarcinoma (HCC) 04/2017   S/p total colectomy   Protein calorie malnutrition (HCC)    Pyelonephritis    Renal failure (ARF), acute on chronic (HCC)    Renal insufficiency    chronic history   Sepsis (HCC)    Smoking    Uremia    Urinary retention    UTI (lower urinary tract infection) 10/06/2011    Surgical History: Past Surgical History:  Procedure Laterality Date   A/V FISTULAGRAM N/A 08/13/2018   Procedure: A/V FISTULAGRAM - Right Upper;  Surgeon: Sherren Kerns, MD;  Location: [facility] INVASIVE CV LAB;  Service: Cardiovascular;  Laterality: N/A;   A/V FISTULAGRAM N/A 11/22/2018    Procedure: A/V FISTULAGRAM - Right Upper;  Surgeon: Maeola Harman, MD;  Location: [facility] INVASIVE CV LAB;  Service: Cardiovascular;  Laterality: N/A;   A/V FISTULAGRAM Left 10/08/2022   Procedure: A/V Fistulagram;  Surgeon: Victorino Sparrow, MD;  Location: [facility] INVASIVE CV LAB;  Service: Cardiovascular;  Laterality: Left;   A/V FISTULAGRAM Left 05/06/2023   Procedure: A/V Fistulagram;  Surgeon: Leonie Douglas, MD;  Location: [facility] INVASIVE CV LAB;  Service: Cardiovascular;  Laterality: Left;   AV FISTULA PLACEMENT Left 07/06/2015   Procedure:  INSERTION LEFT ARM ARTERIOVENOUS GORTEX GRAFT;  Surgeon: Chuck Hint, MD;  Location: [facility] OR;  Service: Vascular;  Laterality: Left;   AV FISTULA PLACEMENT Right 02/26/2016   Procedure: ARTERIOVENOUS (AV) FISTULA CREATION ;  Surgeon: Chuck Hint, MD;  Location: [facility] OR;  Service: Vascular;  Laterality: Right;   AV FISTULA PLACEMENT Right 11/25/2018   Procedure: INSERTION OF ARTERIOVENOUS (AV) ARTEGRAFT RIGHT UPPER ARM;  Surgeon: Maeola Harman, MD;  Location: [facility] OR;  Service: Vascular;  Laterality: Right;   AV FISTULA PLACEMENT Left 05/28/2021   Procedure: LEFT ARM ARTERIOVENOUS (AV) FISTULA;  Surgeon: Larina Earthly, MD;  Location: [facility] ORS;  Service: Vascular;  Laterality: Left;   AVGG REMOVAL Left 10/09/2015   Procedure: REMOVAL  OF ARTERIOVENOUS GORETEX GRAFT (AVGG) Evacuation of Lymphocele, Vein Patch angioplasty of brachial artery.;  Surgeon: Chuck Hint, MD;  Location: [facility] OR;  Service: Vascular;  Laterality: Left;   BASCILIC VEIN TRANSPOSITION Right 02/26/2016   Procedure: Right BASCILIC VEIN TRANSPOSITION;  Surgeon: Chuck Hint, MD;  Location: [facility] OR;  Service: Vascular;  Laterality: Right;   BASCILIC VEIN TRANSPOSITION Left 07/30/2021   Procedure: LEFT ARM SECOND STAGE BASILIC VEIN TRANSPOSITION;  Surgeon: Larina Earthly, MD;  Location: [facility] ORS;  Service: Vascular;  Laterality: Left;   CIRCUMCISION N/A  01/04/2014   Procedure: CIRCUMCISION ADULT (procedure #1);  Surgeon: Ky Barban, MD;  Location: [facility] ORS;  Service: Urology;  Laterality: N/A;   COLECTOMY N/A 05/04/2017   Procedure: TOTAL COLECTOMY;  Surgeon: Franky Macho, MD;  Location: [facility] ORS;  Service: General;  Laterality: N/A;   COLONOSCOPY N/A 04/27/2017   Surgeon: Corbin Ade, MD; annular mass in the ascending colon likely representing cancer biopsied, multiple 6-22 mm polyps removed, clean rectum.  Pathology with multiple tubular adenomas, high-grade dysplasia noted in ascending colon and splenic flexure.   CYSTOSCOPY W/ RETROGRADES Bilateral 06/29/2015   Procedure: CYSTOSCOPY, DILATION OF URETHRAL STRICTURE WITH BILATERAL RETROGRADE PYELOGRAM,SUPRAPUBIC TUBE CHANGE;  Surgeon: Jerilee Field, MD;  Location: [facility] ORS;  Service: Urology;  Laterality: Bilateral;   CYSTOSCOPY WITH URETHRAL DILATATION N/A 12/29/2013   Procedure: CYSTOSCOPY WITH URETHRAL DILATATION;  Surgeon: Ky Barban, MD;  Location: [facility];  Service: Urology;  Laterality: N/A;   ESOPHAGOGASTRODUODENOSCOPY N/A 04/27/2017   Procedure: ESOPHAGOGASTRODUODENOSCOPY (EGD);  Surgeon: Corbin Ade, MD;  [facility]: [facility];  Service: Endoscopy;  Laterality: N/A;   FLEXIBLE SIGMOIDOSCOPY N/A 12/02/2021   Procedure: FLEXIBLE SIGMOIDOSCOPY;  Surgeon: Lanelle Bal, DO;  Location: [facility];  Service: Endoscopy;  Laterality: N/A;  10:30am, dialysis patient   INSERTION OF DIALYSIS CATHETER Right 11/25/2018   Procedure: INSERTION OF DIALYSIS CATHETER RIGHT INTERNAL JUGULAR;  Surgeon: Maeola Harman, MD;  Location: [facility] OR;  Service: Vascular;  Laterality: Right;   IR AV DIALY SHUNT INTRO NEEDLE/INTRACATH INITIAL W/PTA/IMG RIGHT Right 09/07/2018   IR FLUORO GUIDE CV LINE RIGHT  10/16/2020   IR FLUORO GUIDE CV LINE RIGHT  10/03/2022   IR REMOVAL TUN CV CATH W/O FL  01/12/2019   IR THROMBECTOMY AV FISTULA W/THROMBOLYSIS/PTA INC/SHUNT/IMG RIGHT Right  04/26/2018   IR US GUIDE VASC ACCESS RIGHT  04/26/2018   IR US GUIDE VASC ACCESS RIGHT  09/07/2018   IR US GUIDE VASC ACCESS RIGHT  10/16/2020   IR US GUIDE VASC ACCESS RIGHT  10/16/2020   IR US GUIDE VASC ACCESS RIGHT  10/03/2022   ORIF FEMUR FRACTURE Right 11/22/2016   Procedure: OPEN REDUCTION INTERNAL FIXATION (ORIF) DISTAL FEMUR FRACTURE;  Surgeon: Samson Frederic, MD;  Location: [facility] OR;  Service: Orthopedics;  Laterality: Right;   PATCH ANGIOPLASTY Right 12/10/2017   Procedure: PATCH ANGIOPLASTY;  Surgeon: Chuck Hint, MD;  Location: [facility] OR;  Service: Vascular;  Laterality: Right;   PERIPHERAL VASCULAR BALLOON ANGIOPLASTY  08/13/2018   Procedure: PERIPHERAL VASCULAR BALLOON ANGIOPLASTY;  Surgeon: Sherren Kerns, MD;  Location: [facility] INVASIVE CV LAB;  Service: Cardiovascular;;  right AV fistula    PERIPHERAL VASCULAR BALLOON ANGIOPLASTY  11/22/2018   Procedure: PERIPHERAL VASCULAR BALLOON ANGIOPLASTY;  Surgeon: Maeola Harman, MD;  Location: [facility] INVASIVE CV LAB;  Service: Cardiovascular;;  rt AV fistula   PERIPHERAL VASCULAR BALLOON ANGIOPLASTY  10/08/2022   Procedure: PERIPHERAL VASCULAR BALLOON  ANGIOPLASTY;  Surgeon: Victorino Sparrow, MD;  Location: [facility] INVASIVE CV LAB;  Service: Cardiovascular;;  arterial anastamosis   PERIPHERAL VASCULAR CATHETERIZATION N/A 10/08/2015   Procedure: A/V Shuntogram;  Surgeon: Chuck Hint, MD;  Location: [facility] INVASIVE CV LAB;  Service: Cardiovascular;  Laterality: N/A;   PERIPHERAL VASCULAR INTERVENTION Left 10/08/2022   Procedure: PERIPHERAL VASCULAR INTERVENTION;  Surgeon: Victorino Sparrow, MD;  Location: [facility] INVASIVE CV LAB;  Service: Cardiovascular;  Laterality: Left;  left AVF   REMOVAL OF A DIALYSIS CATHETER N/A 12/03/2021   Procedure: MINOR REMOVAL OF A TUNNELED DIALYSIS CATHETER;  Surgeon: Larina Earthly, MD;  Location: [facility];  Service: Vascular;  Laterality: N/A;   REMOVAL OF A DIALYSIS CATHETER N/A 11/18/2022   Procedure:  MINOR REMOVAL OF A TUNNELED DIALYSIS CATHETER;  Surgeon: Larina Earthly, MD;  Location: [facility];  Service: Vascular;  Laterality: N/A;   THROMBECTOMY W/ EMBOLECTOMY Right 12/10/2017   Procedure: THROMBECTOMY REVISION RIGHT ARM  ARTERIOVENOUS FISTULA;  Surgeon: Chuck Hint, MD;  Location: [facility] OR;  Service: Vascular;  Laterality: Right;   TRANSURETHRAL RESECTION OF PROSTATE N/A 01/04/2014   Procedure: TRANSURETHRAL RESECTION OF THE PROSTATE (TURP) (procedure #2);  Surgeon: Ky Barban, MD;  Location: [facility];  Service: Urology;  Laterality: N/A;    Home Medications:  Allergies as of 01/22/2024   No Known Allergies      Medication List        Accurate as of January 22, 2024 10:35 AM. If you have any questions, ask your nurse or doctor.          (feeding supplement) PROSource Plus liquid Take 30 mLs by mouth 2 (two) times daily between meals. 1600 and 2200   aspirin EC 81 MG tablet Take 81 mg by mouth daily. 0900   Basaglar KwikPen 100 UNIT/ML Inject 8 Units into the skin at bedtime. 2200   BD AutoShield Duo 30G X 5 MM Misc Generic drug: Insulin Pen Needle by Does not apply route. 3/16"   cyanocobalamin 1000 MCG tablet Commonly known as: VITAMIN B12 Take 1,000 mcg by mouth at bedtime.   levothyroxine 125 MCG tablet Commonly known as: SYNTHROID Take 125 mcg by mouth at bedtime. 10 am, Special Instructions: Must be given (1) hour apart from food or other medications per pharmacy recommendations   midodrine 10 MG tablet Commonly known as: PROAMATINE Take 10 mg by mouth See admin instructions. Send med with resident on  Tues/Thurs/Sat to dialysis. do not give at [facility], give to resident to take with him. send with 5mg  to equal 15mg . 6:00   midodrine 5 MG tablet Commonly known as: PROAMATINE Take 5 mg by mouth See admin instructions. Send with resident on dialysis days.  Tues/Thurs/Sat  Take along with 10 mg to equal 15 mg 6:00   NON FORMULARY Diet: Regular  diet   omega-3 acid ethyl esters 1 g capsule Commonly known as: LOVAZA Take 2 g by mouth at bedtime. 2200   omeprazole 40 MG capsule Commonly known as: PRILOSEC Take 40 mg by mouth at bedtime. 2200   polyethylene glycol 17 g packet Commonly known as: MIRALAX / GLYCOLAX Take 17 g by mouth daily as needed for mild constipation.   rosuvastatin 20 MG tablet Commonly known as: CRESTOR Take 20 mg by mouth daily.   sevelamer carbonate 800 MG tablet Commonly known as: RENVELA Take 3 tablets (2,400 mg total) by mouth 3 (three) times daily with meals.   tamsulosin 0.4  MG Caps capsule Commonly known as: FLOMAX Take 0.4 mg by mouth at bedtime. Give 30 minutes after a meal. Do not crush or chew   torsemide 20 MG tablet Commonly known as: DEMADEX Take 20 mg by mouth See admin instructions. 20mg  daily at 2000 on Sunday, Monday, Wednesday, and Friday only.        Allergies: No Known Allergies  Family History: Family History  Problem Relation Age of Onset   Cancer Mother    Colon cancer Neg Hx     Social History:  reports that he has never smoked. He has never used smokeless tobacco. He reports that he does not drink alcohol and does not use drugs.  ROS: All other review of systems were reviewed and are negative except what is noted above in HPI  Physical Exam: BP (!) 146/75   Pulse 84   Constitutional:  Alert and oriented, No acute distress. HEENT: [place] AT, moist mucus membranes.  Trachea midline, no masses. Cardiovascular: No clubbing, cyanosis, or edema. Respiratory: Normal respiratory effort, no increased work of breathing. GI: Abdomen is soft, nontender, nondistended, no abdominal masses GU: No CVA tenderness.  Lymph: No cervical or inguinal lymphadenopathy. Skin: No rashes, bruises or suspicious lesions. Neurologic: Grossly intact, no focal deficits, moving all 4 extremities. Psychiatric: Normal mood and affect.  Laboratory Data: Lab Results  Component Value Date    WBC 3.9 (L) 12/17/2023   HGB 10.6 (L) 12/17/2023   HCT 30.5 (L) 12/17/2023   MCV 103.7 (H) 12/17/2023   PLT 110 (L) 12/17/2023    Lab Results  Component Value Date   CREATININE 10.88 (H) 06/16/2023    No results found for: "PSA"  No results found for: "TESTOSTERONE"  Lab Results  Component Value Date   HGBA1C 6.6 (H) 12/17/2023    Urinalysis    Component Value Date/Time   COLORURINE AMBER (A) 06/12/2017 0615   APPEARANCEUR HAZY (A) 06/12/2017 0615   LABSPEC 1.021 06/12/2017 0615   PHURINE 5.0 06/12/2017 0615   GLUCOSEU NEGATIVE 06/12/2017 0615   HGBUR NEGATIVE 06/12/2017 0615   BILIRUBINUR NEGATIVE 06/12/2017 0615   KETONESUR NEGATIVE 06/12/2017 0615   PROTEINUR 30 (A) 06/12/2017 0615   UROBILINOGEN 0.2 07/20/2015 1850   NITRITE NEGATIVE 06/12/2017 0615   LEUKOCYTESUR NEGATIVE 06/12/2017 0615    Lab Results  Component Value Date   BACTERIA NONE SEEN 06/12/2017    Pertinent Imaging: *** No results found for this or any previous visit.  Results for orders placed during the hospital encounter of 10/01/11  US Venous Img Lower Bilateral  Narrative *RADIOLOGY REPORT*  Clinical Data: Bilateral lower extremity edema, history diabetes  VENOUS DUPLEX ULTRASOUND OF BILATERAL LOWER EXTREMITIES  Technique:  Gray-scale sonography with graded compression, as well as color Doppler and duplex ultrasound, were performed to evaluate the deep venous system of both lower extremities from the level of the common femoral vein through the popliteal and proximal calf veins.  Spectral Doppler was utilized to evaluate flow at rest and with distal augmentation maneuvers.  Comparison:  None  Findings: Deep venous systems appear patent and compressible from groin to popliteal fossa bilaterally.  Spontaneous venous flow present with intact augmentation and evidence of respiratory phasicity.  No intraluminal thrombus identified.  Flow within the right popliteal vein is  slightly sluggish.  Visualized portions of the greater saphenous systems appear patent.  IMPRESSION: No evidence of deep venous thrombosis in the lower extremities.  Original Report Authenticated By: Lollie Marrow, M.D.  No  results found for this or any previous visit.  No results found for this or any previous visit.  Results for orders placed during the hospital encounter of 04/03/16  US Renal  Narrative CLINICAL DATA:  Pain from catheter.  Renal insufficiency.  EXAM: RENAL / URINARY TRACT ULTRASOUND COMPLETE  COMPARISON:  CT abdomen dated 03/10/2016. Also abdomen ultrasound dated 05/24/2015.  FINDINGS: Right Kidney:  Length: 11.4 cm. Increased cortical echogenicity, similar to the previous abdomen ultrasound. Stable mild hydronephrosis.  Left Kidney:  Length: 10 cm. Increased renal cortex echogenicity. Benign-appearing cyst within the midpole region of the left kidney, measuring approximately 2.8 cm greatest dimension, probably stable given differences in measurement technique. No hydronephrosis.  Bladder:  Bladder decompressed by Foley catheter. Bladder wall thickness measured at 1.3 cm, probably accentuated to some degree by the bladder decompression.  IMPRESSION: 1. Bladder decompressed by Foley catheter. Bladder wall appears thickened, measured at 1.3 cm thickness, probably accentuated to some degree by the bladder decompression. Recommend correlation with urinalysis to exclude associated cystitis. 2. Stable mild right-sided hydronephrosis. 3. Echogenic renal cortices bilaterally suggesting chronic medical renal disease. These results will be called to the ordering clinician or representative by the Radiologist Assistant, and communication documented in the PACS or zVision Dashboard.   Electronically Signed By: Bary Richard M.D. On: 04/03/2016 13:37  No results found for this or any previous visit.  No results found for this or any previous  visit.  No results found for this or any previous visit.   Assessment & Plan:    1. Elevated PSA (Primary) IsoPSA, will call with results Followup 3 months with a PSA   No follow-ups on file.  Wilkie Aye, MD  [facility] [place]

## 2024-01-23 DIAGNOSIS — D631 Anemia in chronic kidney disease: Secondary | ICD-10-CM | POA: Diagnosis not present

## 2024-01-23 DIAGNOSIS — N2581 Secondary hyperparathyroidism of renal origin: Secondary | ICD-10-CM | POA: Diagnosis not present

## 2024-01-23 DIAGNOSIS — D509 Iron deficiency anemia, unspecified: Secondary | ICD-10-CM | POA: Diagnosis not present

## 2024-01-23 DIAGNOSIS — N186 End stage renal disease: Secondary | ICD-10-CM | POA: Diagnosis not present

## 2024-01-23 DIAGNOSIS — N250 Renal osteodystrophy: Secondary | ICD-10-CM | POA: Diagnosis not present

## 2024-01-23 DIAGNOSIS — Z992 Dependence on renal dialysis: Secondary | ICD-10-CM | POA: Diagnosis not present

## 2024-01-25 ENCOUNTER — Encounter: Payer: Self-pay | Admitting: Urology

## 2024-01-25 DIAGNOSIS — N186 End stage renal disease: Secondary | ICD-10-CM | POA: Diagnosis not present

## 2024-01-25 DIAGNOSIS — Z992 Dependence on renal dialysis: Secondary | ICD-10-CM | POA: Diagnosis not present

## 2024-01-25 NOTE — Patient Instructions (Signed)

## 2024-01-26 DIAGNOSIS — Z992 Dependence on renal dialysis: Secondary | ICD-10-CM | POA: Diagnosis not present

## 2024-01-26 DIAGNOSIS — D509 Iron deficiency anemia, unspecified: Secondary | ICD-10-CM | POA: Diagnosis not present

## 2024-01-26 DIAGNOSIS — N2581 Secondary hyperparathyroidism of renal origin: Secondary | ICD-10-CM | POA: Diagnosis not present

## 2024-01-26 DIAGNOSIS — Z23 Encounter for immunization: Secondary | ICD-10-CM | POA: Diagnosis not present

## 2024-01-26 DIAGNOSIS — D631 Anemia in chronic kidney disease: Secondary | ICD-10-CM | POA: Diagnosis not present

## 2024-01-26 DIAGNOSIS — N186 End stage renal disease: Secondary | ICD-10-CM | POA: Diagnosis not present

## 2024-01-28 DIAGNOSIS — D509 Iron deficiency anemia, unspecified: Secondary | ICD-10-CM | POA: Diagnosis not present

## 2024-01-28 DIAGNOSIS — N2581 Secondary hyperparathyroidism of renal origin: Secondary | ICD-10-CM | POA: Diagnosis not present

## 2024-01-28 DIAGNOSIS — D631 Anemia in chronic kidney disease: Secondary | ICD-10-CM | POA: Diagnosis not present

## 2024-01-28 DIAGNOSIS — Z992 Dependence on renal dialysis: Secondary | ICD-10-CM | POA: Diagnosis not present

## 2024-01-28 DIAGNOSIS — N186 End stage renal disease: Secondary | ICD-10-CM | POA: Diagnosis not present

## 2024-01-30 DIAGNOSIS — N186 End stage renal disease: Secondary | ICD-10-CM | POA: Diagnosis not present

## 2024-01-30 DIAGNOSIS — N2581 Secondary hyperparathyroidism of renal origin: Secondary | ICD-10-CM | POA: Diagnosis not present

## 2024-01-30 DIAGNOSIS — D509 Iron deficiency anemia, unspecified: Secondary | ICD-10-CM | POA: Diagnosis not present

## 2024-01-30 DIAGNOSIS — D631 Anemia in chronic kidney disease: Secondary | ICD-10-CM | POA: Diagnosis not present

## 2024-01-30 DIAGNOSIS — Z992 Dependence on renal dialysis: Secondary | ICD-10-CM | POA: Diagnosis not present

## 2024-02-02 DIAGNOSIS — Z992 Dependence on renal dialysis: Secondary | ICD-10-CM | POA: Diagnosis not present

## 2024-02-02 DIAGNOSIS — D509 Iron deficiency anemia, unspecified: Secondary | ICD-10-CM | POA: Diagnosis not present

## 2024-02-02 DIAGNOSIS — D631 Anemia in chronic kidney disease: Secondary | ICD-10-CM | POA: Diagnosis not present

## 2024-02-02 DIAGNOSIS — N186 End stage renal disease: Secondary | ICD-10-CM | POA: Diagnosis not present

## 2024-02-02 DIAGNOSIS — N2581 Secondary hyperparathyroidism of renal origin: Secondary | ICD-10-CM | POA: Diagnosis not present

## 2024-02-04 DIAGNOSIS — N186 End stage renal disease: Secondary | ICD-10-CM | POA: Diagnosis not present

## 2024-02-04 DIAGNOSIS — N2581 Secondary hyperparathyroidism of renal origin: Secondary | ICD-10-CM | POA: Diagnosis not present

## 2024-02-04 DIAGNOSIS — Z992 Dependence on renal dialysis: Secondary | ICD-10-CM | POA: Diagnosis not present

## 2024-02-04 DIAGNOSIS — D631 Anemia in chronic kidney disease: Secondary | ICD-10-CM | POA: Diagnosis not present

## 2024-02-04 DIAGNOSIS — D509 Iron deficiency anemia, unspecified: Secondary | ICD-10-CM | POA: Diagnosis not present

## 2024-02-05 DIAGNOSIS — Z8616 Personal history of COVID-19: Secondary | ICD-10-CM | POA: Diagnosis not present

## 2024-02-05 DIAGNOSIS — R488 Other symbolic dysfunctions: Secondary | ICD-10-CM | POA: Diagnosis not present

## 2024-02-06 DIAGNOSIS — D631 Anemia in chronic kidney disease: Secondary | ICD-10-CM | POA: Diagnosis not present

## 2024-02-06 DIAGNOSIS — D509 Iron deficiency anemia, unspecified: Secondary | ICD-10-CM | POA: Diagnosis not present

## 2024-02-06 DIAGNOSIS — N2581 Secondary hyperparathyroidism of renal origin: Secondary | ICD-10-CM | POA: Diagnosis not present

## 2024-02-06 DIAGNOSIS — N186 End stage renal disease: Secondary | ICD-10-CM | POA: Diagnosis not present

## 2024-02-06 DIAGNOSIS — Z992 Dependence on renal dialysis: Secondary | ICD-10-CM | POA: Diagnosis not present

## 2024-02-08 DIAGNOSIS — Z8616 Personal history of COVID-19: Secondary | ICD-10-CM | POA: Diagnosis not present

## 2024-02-08 DIAGNOSIS — R488 Other symbolic dysfunctions: Secondary | ICD-10-CM | POA: Diagnosis not present

## 2024-02-08 NOTE — Progress Notes (Signed)
 Letter sent.

## 2024-02-09 DIAGNOSIS — Z992 Dependence on renal dialysis: Secondary | ICD-10-CM | POA: Diagnosis not present

## 2024-02-09 DIAGNOSIS — N2581 Secondary hyperparathyroidism of renal origin: Secondary | ICD-10-CM | POA: Diagnosis not present

## 2024-02-09 DIAGNOSIS — Z8616 Personal history of COVID-19: Secondary | ICD-10-CM | POA: Diagnosis not present

## 2024-02-09 DIAGNOSIS — N186 End stage renal disease: Secondary | ICD-10-CM | POA: Diagnosis not present

## 2024-02-09 DIAGNOSIS — R488 Other symbolic dysfunctions: Secondary | ICD-10-CM | POA: Diagnosis not present

## 2024-02-09 DIAGNOSIS — D631 Anemia in chronic kidney disease: Secondary | ICD-10-CM | POA: Diagnosis not present

## 2024-02-09 DIAGNOSIS — D509 Iron deficiency anemia, unspecified: Secondary | ICD-10-CM | POA: Diagnosis not present

## 2024-02-10 DIAGNOSIS — R488 Other symbolic dysfunctions: Secondary | ICD-10-CM | POA: Diagnosis not present

## 2024-02-10 DIAGNOSIS — Z794 Long term (current) use of insulin: Secondary | ICD-10-CM | POA: Diagnosis not present

## 2024-02-10 DIAGNOSIS — E1151 Type 2 diabetes mellitus with diabetic peripheral angiopathy without gangrene: Secondary | ICD-10-CM | POA: Diagnosis not present

## 2024-02-10 DIAGNOSIS — Z8616 Personal history of COVID-19: Secondary | ICD-10-CM | POA: Diagnosis not present

## 2024-02-10 DIAGNOSIS — L602 Onychogryphosis: Secondary | ICD-10-CM | POA: Diagnosis not present

## 2024-02-10 DIAGNOSIS — L603 Nail dystrophy: Secondary | ICD-10-CM | POA: Diagnosis not present

## 2024-02-11 DIAGNOSIS — N2581 Secondary hyperparathyroidism of renal origin: Secondary | ICD-10-CM | POA: Diagnosis not present

## 2024-02-11 DIAGNOSIS — Z8616 Personal history of COVID-19: Secondary | ICD-10-CM | POA: Diagnosis not present

## 2024-02-11 DIAGNOSIS — N186 End stage renal disease: Secondary | ICD-10-CM | POA: Diagnosis not present

## 2024-02-11 DIAGNOSIS — D631 Anemia in chronic kidney disease: Secondary | ICD-10-CM | POA: Diagnosis not present

## 2024-02-11 DIAGNOSIS — Z992 Dependence on renal dialysis: Secondary | ICD-10-CM | POA: Diagnosis not present

## 2024-02-11 DIAGNOSIS — D509 Iron deficiency anemia, unspecified: Secondary | ICD-10-CM | POA: Diagnosis not present

## 2024-02-11 DIAGNOSIS — R488 Other symbolic dysfunctions: Secondary | ICD-10-CM | POA: Diagnosis not present

## 2024-02-12 DIAGNOSIS — R488 Other symbolic dysfunctions: Secondary | ICD-10-CM | POA: Diagnosis not present

## 2024-02-12 DIAGNOSIS — Z8616 Personal history of COVID-19: Secondary | ICD-10-CM | POA: Diagnosis not present

## 2024-02-13 DIAGNOSIS — D631 Anemia in chronic kidney disease: Secondary | ICD-10-CM | POA: Diagnosis not present

## 2024-02-13 DIAGNOSIS — N2581 Secondary hyperparathyroidism of renal origin: Secondary | ICD-10-CM | POA: Diagnosis not present

## 2024-02-13 DIAGNOSIS — N186 End stage renal disease: Secondary | ICD-10-CM | POA: Diagnosis not present

## 2024-02-13 DIAGNOSIS — D509 Iron deficiency anemia, unspecified: Secondary | ICD-10-CM | POA: Diagnosis not present

## 2024-02-13 DIAGNOSIS — Z992 Dependence on renal dialysis: Secondary | ICD-10-CM | POA: Diagnosis not present

## 2024-02-15 DIAGNOSIS — R488 Other symbolic dysfunctions: Secondary | ICD-10-CM | POA: Diagnosis not present

## 2024-02-15 DIAGNOSIS — Z8616 Personal history of COVID-19: Secondary | ICD-10-CM | POA: Diagnosis not present

## 2024-02-16 ENCOUNTER — Observation Stay (HOSPITAL_COMMUNITY)
Admission: EM | Admit: 2024-02-16 | Discharge: 2024-02-18 | Disposition: A | Discharge status: Home / Self Care | Attending: Internal Medicine | Admitting: Internal Medicine

## 2024-02-16 ENCOUNTER — Encounter (HOSPITAL_COMMUNITY): Payer: Self-pay | Admitting: *Deleted

## 2024-02-16 ENCOUNTER — Emergency Department (HOSPITAL_COMMUNITY)

## 2024-02-16 ENCOUNTER — Other Ambulatory Visit: Payer: Self-pay

## 2024-02-16 DIAGNOSIS — N186 End stage renal disease: Secondary | ICD-10-CM | POA: Insufficient documentation

## 2024-02-16 DIAGNOSIS — Z9889 Other specified postprocedural states: Secondary | ICD-10-CM | POA: Insufficient documentation

## 2024-02-16 DIAGNOSIS — E875 Hyperkalemia: Secondary | ICD-10-CM | POA: Diagnosis not present

## 2024-02-16 DIAGNOSIS — Z79890 Hormone replacement therapy (postmenopausal): Secondary | ICD-10-CM | POA: Diagnosis not present

## 2024-02-16 DIAGNOSIS — D631 Anemia in chronic kidney disease: Secondary | ICD-10-CM | POA: Diagnosis not present

## 2024-02-16 DIAGNOSIS — Z992 Dependence on renal dialysis: Secondary | ICD-10-CM | POA: Diagnosis not present

## 2024-02-16 DIAGNOSIS — R0602 Shortness of breath: Secondary | ICD-10-CM | POA: Diagnosis not present

## 2024-02-16 DIAGNOSIS — E039 Hypothyroidism, unspecified: Secondary | ICD-10-CM | POA: Insufficient documentation

## 2024-02-16 DIAGNOSIS — Z85038 Personal history of other malignant neoplasm of large intestine: Secondary | ICD-10-CM | POA: Insufficient documentation

## 2024-02-16 DIAGNOSIS — F70 Mild intellectual disabilities: Secondary | ICD-10-CM | POA: Insufficient documentation

## 2024-02-16 DIAGNOSIS — K219 Gastro-esophageal reflux disease without esophagitis: Secondary | ICD-10-CM | POA: Diagnosis not present

## 2024-02-16 DIAGNOSIS — Z7982 Long term (current) use of aspirin: Secondary | ICD-10-CM | POA: Insufficient documentation

## 2024-02-16 DIAGNOSIS — E785 Hyperlipidemia, unspecified: Secondary | ICD-10-CM | POA: Insufficient documentation

## 2024-02-16 DIAGNOSIS — Z789 Other specified health status: Secondary | ICD-10-CM | POA: Diagnosis not present

## 2024-02-16 DIAGNOSIS — Z79899 Other long term (current) drug therapy: Secondary | ICD-10-CM | POA: Insufficient documentation

## 2024-02-16 DIAGNOSIS — E1122 Type 2 diabetes mellitus with diabetic chronic kidney disease: Secondary | ICD-10-CM | POA: Insufficient documentation

## 2024-02-16 DIAGNOSIS — I959 Hypotension, unspecified: Secondary | ICD-10-CM | POA: Insufficient documentation

## 2024-02-16 DIAGNOSIS — Z794 Long term (current) use of insulin: Secondary | ICD-10-CM | POA: Diagnosis not present

## 2024-02-16 DIAGNOSIS — I951 Orthostatic hypotension: Secondary | ICD-10-CM | POA: Diagnosis not present

## 2024-02-16 DIAGNOSIS — T82868A Thrombosis of vascular prosthetic devices, implants and grafts, initial encounter: Principal | ICD-10-CM | POA: Insufficient documentation

## 2024-02-16 DIAGNOSIS — T82858A Stenosis of vascular prosthetic devices, implants and grafts, initial encounter: Secondary | ICD-10-CM | POA: Diagnosis not present

## 2024-02-16 DIAGNOSIS — T8249XA Other complication of vascular dialysis catheter, initial encounter: Secondary | ICD-10-CM | POA: Diagnosis present

## 2024-02-16 DIAGNOSIS — T82598A Other mechanical complication of other cardiac and vascular devices and implants, initial encounter: Secondary | ICD-10-CM | POA: Diagnosis not present

## 2024-02-16 HISTORY — PX: IR AV DIALY SHUNT INTRO NEEDLE/INTRAC INITIAL W/PTA/STENT/IMG LT: IMG6104

## 2024-02-16 LAB — COMPREHENSIVE METABOLIC PANEL WITH GFR
ALT: 25 U/L (ref 0–44)
AST: 21 U/L (ref 15–41)
Albumin: 3.4 g/dL — ABNORMAL LOW (ref 3.5–5.0)
Alkaline Phosphatase: 53 U/L (ref 38–126)
Anion gap: 13 (ref 5–15)
BUN: 80 mg/dL — ABNORMAL HIGH (ref 8–23)
CO2: 18 mmol/L — ABNORMAL LOW (ref 22–32)
Calcium: 9.3 mg/dL (ref 8.9–10.3)
Chloride: 103 mmol/L (ref 98–111)
Creatinine, Ser: 17.85 mg/dL — ABNORMAL HIGH (ref 0.61–1.24)
GFR, Estimated: 2 mL/min — ABNORMAL LOW (ref >60)
Glucose, Bld: 138 mg/dL — ABNORMAL HIGH (ref 70–99)
Potassium: 5.8 mmol/L — ABNORMAL HIGH (ref 3.5–5.1)
Sodium: 134 mmol/L — ABNORMAL LOW (ref 135–145)
Total Bilirubin: 0.6 mg/dL (ref 0.0–1.2)
Total Protein: 7.3 g/dL (ref 6.5–8.1)

## 2024-02-16 LAB — CBC WITH DIFFERENTIAL/PLATELET
Abs Immature Granulocytes: 0.02 10*3/uL (ref 0.00–0.07)
Basophils Absolute: 0 10*3/uL (ref 0.0–0.1)
Basophils Relative: 1 %
Eosinophils Absolute: 0.1 10*3/uL (ref 0.0–0.5)
Eosinophils Relative: 2 %
HCT: 27.9 % — ABNORMAL LOW (ref 39.0–52.0)
Hemoglobin: 9.3 g/dL — ABNORMAL LOW (ref 13.0–17.0)
Immature Granulocytes: 1 %
Lymphocytes Relative: 26 %
Lymphs Abs: 1.1 10*3/uL (ref 0.7–4.0)
MCH: 34.8 pg — ABNORMAL HIGH (ref 26.0–34.0)
MCHC: 33.3 g/dL (ref 30.0–36.0)
MCV: 104.5 fL — ABNORMAL HIGH (ref 80.0–100.0)
Monocytes Absolute: 0.5 10*3/uL (ref 0.1–1.0)
Monocytes Relative: 11 %
Neutro Abs: 2.6 10*3/uL (ref 1.7–7.7)
Neutrophils Relative %: 59 %
Platelets: 81 10*3/uL — ABNORMAL LOW (ref 150–400)
RBC: 2.67 MIL/uL — ABNORMAL LOW (ref 4.22–5.81)
RDW: 13.4 % (ref 11.5–15.5)
Smear Review: DECREASED
WBC: 4.3 10*3/uL (ref 4.0–10.5)
nRBC: 0 % (ref 0.0–0.2)

## 2024-02-16 LAB — HEPATITIS B SURFACE ANTIGEN: Hepatitis B Surface Ag: NONREACTIVE

## 2024-02-16 MED ORDER — LIDOCAINE HCL 1 % IJ SOLN
20.0000 mL | Freq: Once | INTRAMUSCULAR | Status: AC
Start: 2024-02-16 — End: 2024-02-16
  Administered 2024-02-16: 10 mL

## 2024-02-16 MED ORDER — FENTANYL CITRATE (PF) 100 MCG/2ML IJ SOLN
INTRAMUSCULAR | Status: AC | PRN
  Administered 2024-02-16 (×4): 25 ug via INTRAVENOUS

## 2024-02-16 MED ORDER — IPRATROPIUM-ALBUTEROL 0.5-2.5 (3) MG/3ML IN SOLN
RESPIRATORY_TRACT | Status: AC
  Filled 2024-02-16: qty 3

## 2024-02-16 MED ORDER — HEPARIN SODIUM (PORCINE) 1000 UNIT/ML DIALYSIS: 1000.0000 [IU] | INTRAMUSCULAR | Status: DC | PRN

## 2024-02-16 MED ORDER — ALTEPLASE 2 MG IJ SOLR
INTRAMUSCULAR | Status: AC
  Filled 2024-02-16: qty 2

## 2024-02-16 MED ORDER — SODIUM ZIRCONIUM CYCLOSILICATE 5 G PO PACK
10.0000 g | PACK | Freq: Once | ORAL | Status: AC
Start: 2024-02-16 — End: 2024-02-16
  Administered 2024-02-16: 10 g via ORAL
  Filled 2024-02-16: qty 2

## 2024-02-16 MED ORDER — DEXTROSE 50 % IV SOLN
1.0000 | Freq: Once | INTRAVENOUS | Status: AC
  Administered 2024-02-16: 50 mL via INTRAVENOUS
  Filled 2024-02-16: qty 50

## 2024-02-16 MED ORDER — HEPARIN SODIUM (PORCINE) 1000 UNIT/ML IJ SOLN
INTRAMUSCULAR | Status: AC | PRN
  Administered 2024-02-16: 3000 [IU] via INTRAVENOUS

## 2024-02-16 MED ORDER — CEFAZOLIN SODIUM-DEXTROSE 2-4 GM/100ML-% IV SOLN
INTRAVENOUS | Status: AC | PRN
  Administered 2024-02-16: 2 g via INTRAVENOUS

## 2024-02-16 MED ORDER — ALTEPLASE 2 MG IJ SOLR
INTRAMUSCULAR | Status: AC | PRN
  Administered 2024-02-16: 2 mg

## 2024-02-16 MED ORDER — IOHEXOL 300 MG/ML  SOLN
150.0000 mL | Freq: Once | INTRAMUSCULAR | Status: AC | PRN
  Administered 2024-02-16: 60 mL via INTRAVENOUS

## 2024-02-16 MED ORDER — FENTANYL CITRATE (PF) 100 MCG/2ML IJ SOLN
INTRAMUSCULAR | Status: AC
  Filled 2024-02-16: qty 2

## 2024-02-16 MED ORDER — INSULIN ASPART 100 UNIT/ML IV SOLN
5.0000 [IU] | Freq: Once | INTRAVENOUS | Status: AC
  Administered 2024-02-16: 5 [IU] via INTRAVENOUS

## 2024-02-16 MED ORDER — MIDAZOLAM HCL 2 MG/2ML IJ SOLN
INTRAMUSCULAR | Status: AC
  Filled 2024-02-16: qty 2

## 2024-02-16 MED ORDER — CEFAZOLIN SODIUM-DEXTROSE 2-4 GM/100ML-% IV SOLN
INTRAVENOUS | Status: AC
Start: 2024-02-16 — End: ?
  Filled 2024-02-16: qty 100

## 2024-02-16 MED ORDER — CHLORHEXIDINE GLUCONATE CLOTH 2 % EX PADS: 6.0000 | MEDICATED_PAD | Freq: Every day | CUTANEOUS | Status: DC

## 2024-02-16 MED ORDER — ANTICOAGULANT SODIUM CITRATE 4% (200MG/5ML) IV SOLN
5.0000 mL | Status: DC | PRN
  Filled 2024-02-16: qty 5

## 2024-02-16 MED ORDER — LIDOCAINE-EPINEPHRINE 1 %-1:100000 IJ SOLN
INTRAMUSCULAR | Status: AC
  Filled 2024-02-16: qty 1

## 2024-02-16 MED ORDER — LIDOCAINE-PRILOCAINE 2.5-2.5 % EX CREA: 1.0000 | TOPICAL_CREAM | CUTANEOUS | Status: DC | PRN

## 2024-02-16 MED ORDER — ALBUTEROL SULFATE (2.5 MG/3ML) 0.083% IN NEBU
2.5000 mg | INHALATION_SOLUTION | Freq: Once | RESPIRATORY_TRACT | Status: AC
  Administered 2024-02-16: 2.5 mg via RESPIRATORY_TRACT
  Filled 2024-02-16: qty 3

## 2024-02-16 MED ORDER — ALTEPLASE 2 MG IJ SOLR: 2.0000 mg | Freq: Once | INTRAMUSCULAR | Status: DC | PRN

## 2024-02-16 MED ORDER — HEPARIN SODIUM (PORCINE) 1000 UNIT/ML IJ SOLN
INTRAMUSCULAR | Status: AC
  Filled 2024-02-16: qty 10

## 2024-02-16 MED ORDER — LIDOCAINE HCL (PF) 1 % IJ SOLN: 5.0000 mL | INTRAMUSCULAR | Status: DC | PRN

## 2024-02-16 MED ORDER — MIDAZOLAM HCL 2 MG/2ML IJ SOLN
INTRAMUSCULAR | Status: AC | PRN
  Administered 2024-02-16 (×3): .5 mg via INTRAVENOUS

## 2024-02-16 MED ORDER — PENTAFLUOROPROP-TETRAFLUOROETH EX AERO: 1.0000 | INHALATION_SPRAY | CUTANEOUS | Status: DC | PRN

## 2024-02-16 NOTE — Procedures (Signed)
 Interventional Radiology Procedure Note  Procedure:   US  guided access LUE HD circuit x 2 Fistulagram Mechanical and pharmacologic thrombectomy Treatment of outflow stenosis with PTA and covered stenting serial 8mm & 7mm.   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 Zachary Savage, DO, ABVM, RPVI

## 2024-02-16 NOTE — ED Notes (Signed)
 Consent for hemodialysis signed.

## 2024-02-16 NOTE — ED Triage Notes (Signed)
 Pt from the [facility] and went for dialysis this am but was told his dialysis port is clogged and he was unable to get his dialysis treatment  Pt denies any pain and states last dialysis was Saturday

## 2024-02-16 NOTE — Consult Note (Signed)
 Chief Complaint: Patient was seen in consultation today for clotted hemodialysis fistula, with consideration for fistulogram and possible intervention.  Referring Provider(s): Dr. Angela Kell, MD   Supervising Physician: Myrlene Asper  Patient Status: [facility] - In-pt  Patient is Full Code  History of Present Illness: Zachary [name] is a 78 y.o. male  with PMHx notable for but not limited to ESRD, DM, colon cancer, anemia, intellectual disability, GERD, HLD. Patient is a ward of [facility].   Patient is dialyzed on Tues/Thurs/Sat. Per patient report, patient was unable to proceed with treatment this morning due to a clogged or clotted fistula. Patient was transported to ED for further evaluation. EDP, Dr. Reba Camper, requested Interventional Radiology for fistulogram with possible intervention. Patient is scheduled for same in IR today.   Patient is currently without any significant complaints. Patient is alert and laying in bed, calm. Patient denies any fevers, headache, chest pain, SOB, cough, abdominal pain, nausea, vomiting or bleeding.     Past Medical History:  Diagnosis Date   Abnormal CT scan, kidney 10/06/2011   Acute pyelonephritis 10/07/2011   Anemia    normocytic   Anxiety    mental retardation   Bladder wall thickening 10/06/2011   BPH (benign prostatic hypertrophy)    Diabetes mellitus    Dialysis patient (HCC)    Tuesday, Thursday and Saturday,    DVT of leg (deep venous thrombosis) (HCC) 12/25/2016   Edema     history of lower extremity edema   GERD (gastroesophageal reflux disease)    Heme positive stool    Hydronephrosis    Hyperkalemia    Hyperlipidemia    Hypernatremia    Hypertension    Hypothyroidism    Impaired speech    Infected prosthetic vascular graft (HCC)    MR (mental retardation)    Muscle weakness    Obstructive uropathy    Perinephric abscess 10/07/2011   Poor historian poor historian   Primary  colorectal adenocarcinoma (HCC) 04/2017   S/p total colectomy   Protein calorie malnutrition (HCC)    Pyelonephritis    Renal failure (ARF), acute on chronic (HCC)    Renal insufficiency    chronic history   Sepsis (HCC)    Smoking    Uremia    Urinary retention    UTI (lower urinary tract infection) 10/06/2011    Past Surgical History:  Procedure Laterality Date   A/V FISTULAGRAM N/A 08/13/2018   Procedure: A/V FISTULAGRAM - Right Upper;  Surgeon: Richrd Char, MD;  Location: [facility] INVASIVE CV LAB;  Service: Cardiovascular;  Laterality: N/A;   A/V FISTULAGRAM N/A 11/22/2018   Procedure: A/V FISTULAGRAM - Right Upper;  Surgeon: Adine Hoof, MD;  Location: [facility] INVASIVE CV LAB;  Service: Cardiovascular;  Laterality: N/A;   A/V FISTULAGRAM Left 10/08/2022   Procedure: A/V Fistulagram;  Surgeon: Kayla Part, MD;  Location: [facility] INVASIVE CV LAB;  Service: Cardiovascular;  Laterality: Left;   A/V FISTULAGRAM Left 05/06/2023   Procedure: A/V Fistulagram;  Surgeon: Carlene Che, MD;  Location: [facility] INVASIVE CV LAB;  Service: Cardiovascular;  Laterality: Left;   AV FISTULA PLACEMENT Left 07/06/2015   Procedure:  INSERTION LEFT ARM ARTERIOVENOUS GORTEX GRAFT;  Surgeon: Dannis Dy, MD;  Location: [facility] OR;  Service: Vascular;  Laterality: Left;   AV FISTULA PLACEMENT Right 02/26/2016   Procedure: ARTERIOVENOUS (AV) FISTULA CREATION ;  Surgeon: Dannis Dy, MD;  Location: [facility] OR;  Service: Vascular;  Laterality: Right;   AV FISTULA PLACEMENT Right 11/25/2018   Procedure: INSERTION OF ARTERIOVENOUS (AV) ARTEGRAFT RIGHT UPPER ARM;  Surgeon: Adine Hoof, MD;  Location: [facility] OR;  Service: Vascular;  Laterality: Right;   AV FISTULA PLACEMENT Left 05/28/2021   Procedure: LEFT ARM ARTERIOVENOUS (AV) FISTULA;  Surgeon: Mayo Speck, MD;  Location: [facility] ORS;  Service: Vascular;  Laterality: Left;   AVGG REMOVAL Left 10/09/2015   Procedure: REMOVAL OF  ARTERIOVENOUS GORETEX GRAFT (AVGG) Evacuation of Lymphocele, Vein Patch angioplasty of brachial artery.;  Surgeon: Dannis Dy, MD;  Location: [facility] OR;  Service: Vascular;  Laterality: Left;   BASCILIC VEIN TRANSPOSITION Right 02/26/2016   Procedure: Right BASCILIC VEIN TRANSPOSITION;  Surgeon: Dannis Dy, MD;  Location: [facility] OR;  Service: Vascular;  Laterality: Right;   BASCILIC VEIN TRANSPOSITION Left 07/30/2021   Procedure: LEFT ARM SECOND STAGE BASILIC VEIN TRANSPOSITION;  Surgeon: Mayo Speck, MD;  Location: [facility] ORS;  Service: Vascular;  Laterality: Left;   CIRCUMCISION N/A 01/04/2014   Procedure: CIRCUMCISION ADULT (procedure #1);  Surgeon: Mohammad I Javaid, MD;  Location: [facility] ORS;  Service: Urology;  Laterality: N/A;   COLECTOMY N/A 05/04/2017   Procedure: TOTAL COLECTOMY;  Surgeon: Alanda Allegra, MD;  Location: [facility] ORS;  Service: General;  Laterality: N/A;   COLONOSCOPY N/A 04/27/2017   Surgeon: Suzette Espy, MD; annular mass in the ascending colon likely representing cancer biopsied, multiple 6-22 mm polyps removed, clean rectum.  Pathology with multiple tubular adenomas, high-grade dysplasia noted in ascending colon and splenic flexure.   CYSTOSCOPY W/ RETROGRADES Bilateral 06/29/2015   Procedure: CYSTOSCOPY, DILATION OF URETHRAL STRICTURE WITH BILATERAL RETROGRADE PYELOGRAM,SUPRAPUBIC TUBE CHANGE;  Surgeon: Christina Coyer, MD;  Location: [facility] ORS;  Service: Urology;  Laterality: Bilateral;   CYSTOSCOPY WITH URETHRAL DILATATION N/A 12/29/2013   Procedure: CYSTOSCOPY WITH URETHRAL DILATATION;  Surgeon: Reggie Caper, MD;  Location: [facility];  Service: Urology;  Laterality: N/A;   ESOPHAGOGASTRODUODENOSCOPY N/A 04/27/2017   Procedure: ESOPHAGOGASTRODUODENOSCOPY (EGD);  Surgeon: Suzette Espy, MD;  [facility];  Service: Endoscopy;  Laterality: N/A;   FLEXIBLE SIGMOIDOSCOPY N/A 12/02/2021   Procedure: FLEXIBLE SIGMOIDOSCOPY;  Surgeon: Vinetta Greening, DO;   Location: [facility];  Service: Endoscopy;  Laterality: N/A;  10:30am, dialysis patient   INSERTION OF DIALYSIS CATHETER Right 11/25/2018   Procedure: INSERTION OF DIALYSIS CATHETER RIGHT INTERNAL JUGULAR;  Surgeon: Adine Hoof, MD;  Location: [facility] OR;  Service: Vascular;  Laterality: Right;   IR AV DIALY SHUNT INTRO NEEDLE/INTRACATH INITIAL W/PTA/IMG RIGHT Right 09/07/2018   IR FLUORO GUIDE CV LINE RIGHT  10/16/2020   IR FLUORO GUIDE CV LINE RIGHT  10/03/2022   IR REMOVAL TUN CV CATH W/O FL  01/12/2019   IR THROMBECTOMY AV FISTULA W/THROMBOLYSIS/PTA INC/SHUNT/IMG RIGHT Right 04/26/2018   IR US  GUIDE VASC ACCESS RIGHT  04/26/2018   IR US  GUIDE VASC ACCESS RIGHT  09/07/2018   IR US  GUIDE VASC ACCESS RIGHT  10/16/2020   IR US  GUIDE VASC ACCESS RIGHT  10/16/2020   IR US  GUIDE VASC ACCESS RIGHT  10/03/2022   ORIF FEMUR FRACTURE Right 11/22/2016   Procedure: OPEN REDUCTION INTERNAL FIXATION (ORIF) DISTAL FEMUR FRACTURE;  Surgeon: Adonica Hoose, MD;  Location: [facility] OR;  Service: Orthopedics;  Laterality: Right;   PATCH ANGIOPLASTY Right 12/10/2017   Procedure: PATCH ANGIOPLASTY;  Surgeon: Dannis Dy, MD;  Location: [facility] OR;  Service: Vascular;  Laterality: Right;   PERIPHERAL  VASCULAR BALLOON ANGIOPLASTY  08/13/2018   Procedure: PERIPHERAL VASCULAR BALLOON ANGIOPLASTY;  Surgeon: Richrd Char, MD;  Location: [facility] INVASIVE CV LAB;  Service: Cardiovascular;;  right AV fistula    PERIPHERAL VASCULAR BALLOON ANGIOPLASTY  11/22/2018   Procedure: PERIPHERAL VASCULAR BALLOON ANGIOPLASTY;  Surgeon: Adine Hoof, MD;  Location: [facility] INVASIVE CV LAB;  Service: Cardiovascular;;  rt AV fistula   PERIPHERAL VASCULAR BALLOON ANGIOPLASTY  10/08/2022   Procedure: PERIPHERAL VASCULAR BALLOON ANGIOPLASTY;  Surgeon: Kayla Part, MD;  Location: [facility] INVASIVE CV LAB;  Service: Cardiovascular;;  arterial anastamosis   PERIPHERAL VASCULAR CATHETERIZATION N/A 10/08/2015   Procedure:  A/V Shuntogram;  Surgeon: Dannis Dy, MD;  Location: [facility] INVASIVE CV LAB;  Service: Cardiovascular;  Laterality: N/A;   PERIPHERAL VASCULAR INTERVENTION Left 10/08/2022   Procedure: PERIPHERAL VASCULAR INTERVENTION;  Surgeon: Kayla Part, MD;  Location: [facility] INVASIVE CV LAB;  Service: Cardiovascular;  Laterality: Left;  left AVF   REMOVAL OF A DIALYSIS CATHETER N/A 12/03/2021   Procedure: MINOR REMOVAL OF A TUNNELED DIALYSIS CATHETER;  Surgeon: Mayo Speck, MD;  Location: [facility];  Service: Vascular;  Laterality: N/A;   REMOVAL OF A DIALYSIS CATHETER N/A 11/18/2022   Procedure: MINOR REMOVAL OF A TUNNELED DIALYSIS CATHETER;  Surgeon: Mayo Speck, MD;  Location: [facility];  Service: Vascular;  Laterality: N/A;   THROMBECTOMY W/ EMBOLECTOMY Right 12/10/2017   Procedure: THROMBECTOMY REVISION RIGHT ARM  ARTERIOVENOUS FISTULA;  Surgeon: Dannis Dy, MD;  Location: [facility] OR;  Service: Vascular;  Laterality: Right;   TRANSURETHRAL RESECTION OF PROSTATE N/A 01/04/2014   Procedure: TRANSURETHRAL RESECTION OF THE PROSTATE (TURP) (procedure #2);  Surgeon: Mohammad I Javaid, MD;  Location: [facility];  Service: Urology;  Laterality: N/A;    Allergies: Patient has no known allergies.  Medications: Prior to Admission medications   Medication Sig Start Date End Date Taking? Authorizing Provider  aspirin  EC 81 MG tablet Take 81 mg by mouth daily. 0900    [provider]  Insulin  Glargine (BASAGLAR  KWIKPEN) 100 UNIT/ML Inject 8 Units into the skin at bedtime. 2200    [provider]  Insulin  Pen Needle (BD AUTOSHIELD DUO) 30G X 5 MM MISC by Does not apply route. 3/16"    [provider]  levothyroxine  (SYNTHROID ) 125 MCG tablet Take 125 mcg by mouth at bedtime. 10 am, Special Instructions: Must be given (1) hour apart from food or other medications per pharmacy recommendations    [provider]  midodrine  (PROAMATINE ) 10 MG tablet Take 10 mg by mouth See  admin instructions. Send med with resident on  Tues/Thurs/Sat to dialysis. do not give at [facility], give to resident to take with him. send with 5mg  to equal 15mg . 6:00 06/01/19   [provider]  midodrine  (PROAMATINE ) 5 MG tablet Take 5 mg by mouth See admin instructions. Send with resident on dialysis days.  Tues/Thurs/Sat  Take along with 10 mg to equal 15 mg 6:00 06/01/19   [provider]  NON FORMULARY Diet: Regular diet    [provider]  Nutritional Supplements (,FEEDING SUPPLEMENT, PROSOURCE PLUS) liquid Take 30 mLs by mouth 2 (two) times daily between meals. 1600 and 2200    [provider]  omega-3 acid ethyl esters (LOVAZA ) 1 g capsule Take 2 g by mouth at bedtime. 2200 06/03/19   [provider]  omeprazole (PRILOSEC) 40 MG capsule Take 40 mg by mouth at bedtime. 2200 01/23/20   [provider]  polyethylene glycol (MIRALAX  / GLYCOLAX ) packet Take 17 g by mouth daily as needed for mild constipation.  05/27/20   [provider]  rosuvastatin  (CRESTOR ) 20 MG tablet Take 20 mg by mouth daily.    [provider]  sevelamer  carbonate (RENVELA ) 800 MG tablet Take 3 tablets (2,400 mg total) by mouth 3 (three) times daily with meals. 10/03/22   Ghimire, Estil Heman, MD  tamsulosin  (FLOMAX ) 0.4 MG CAPS capsule Take 0.4 mg by mouth at bedtime. Give 30 minutes after a meal. Do not crush or chew    [provider]  torsemide  (DEMADEX ) 20 MG tablet Take 20 mg by mouth See admin instructions. 20mg  daily at 2000 on Sunday, Monday, Wednesday, and Friday only.    [provider]  vitamin B-12 (CYANOCOBALAMIN ) 1000 MCG tablet Take 1,000 mcg by mouth at bedtime.    [provider]     Family History  Problem Relation Age of Onset   Cancer Mother    Colon cancer Neg Hx     Social History   Socioeconomic History   Marital status: Single    Spouse name: Not on file   Number of children: Not on file   Years of  education: Not on file   Highest education level: Not on file  Occupational History   Occupation: retired   Tobacco Use   Smoking status: Never   Smokeless tobacco: Never  Vaping Use   Vaping status: Never Used  Substance and Sexual Activity   Alcohol use: No   Drug use: No   Sexual activity: Not Currently  Other Topics Concern   Not on file  Social History Narrative   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   Long term resident of [facility]    Social Drivers of Health   Financial Resource Strain: Low Risk  (02/18/2018)   Overall Financial Resource Strain (CARDIA)    Difficulty of Paying Living Expenses: Not hard at all  Food Insecurity: No Food Insecurity (02/18/2018)   Hunger Vital Sign    Worried About Running Out of Food in the Last Year: Never true    Ran Out of Food in the Last Year: Never true  Transportation Needs: No Transportation Needs (02/18/2018)   PRAPARE - Administrator, Civil Service (Medical): No    Lack of Transportation (Non-Medical): No  Physical Activity: Unknown (09/15/2019)   Exercise Vital Sign    Days of Exercise per Week: Not on file    Minutes of Exercise per Session: 0 min  Stress: No Stress Concern Present (02/18/2018)   [employer] of Occupational Health - Occupational Stress Questionnaire    Feeling of Stress : Not at all  Social Connections: Unknown (09/15/2019)   Social Connection and Isolation Panel [NHANES]    Frequency of Communication with Friends and Family: Not on file    Frequency of Social Gatherings with Friends and Family: Never    Attends Religious Services: Not on Marketing executive or Organizations: Not on file    Attends Banker Meetings: Not on file    Marital Status: Not on file     Review of Systems: A 12 point ROS discussed and pertinent positives are indicated in the HPI above.  All other systems are negative.  Vital Signs: BP (!) 188/98    Pulse 93   Resp 14   SpO2 98%   Advance Care Plan: The advanced  care place/surrogate decision maker was discussed at the time of visit and the patient did not wish to discuss or was not able to name a surrogate decision maker or provide an advance care plan.  Physical Exam Vitals reviewed.  Constitutional:      General: He is not in acute distress.    Appearance: Normal appearance.  HENT:     Mouth/Throat:     Mouth: Mucous membranes are moist.  Cardiovascular:     Rate and Rhythm: Normal rate and regular rhythm.     Pulses: Normal pulses.     Heart sounds: No murmur heard. Pulmonary:     Effort: Pulmonary effort is normal. No respiratory distress.     Breath sounds: Normal breath sounds.  Abdominal:     General: Abdomen is flat.     Tenderness: There is no abdominal tenderness.  Musculoskeletal:        General: Normal range of motion.  Skin:    General: Skin is warm and dry.  Neurological:     Mental Status: He is alert and oriented to person, place, and time.  Psychiatric:        Mood and Affect: Mood normal.        Behavior: Behavior normal.        Thought Content: Thought content normal.        Judgment: Judgment normal.     Imaging: No results found.  Labs:  CBC: Recent Labs    06/11/23 1138 06/13/23 1200 12/17/23 0440 02/16/24 0756  WBC 4.4 3.6* 3.9* 4.3  HGB 11.7* 13.0 10.6* 9.3*  HCT 35.2* 39.2 30.5* 27.9*  PLT 88* 110* 110* 81*    COAGS: No results for input(s): "INR", "APTT" in the last 8760 hours.  BMP: Recent Labs    06/11/23 1138 06/13/23 1200 06/16/23 0825 02/16/24 0756  NA 137 138 136 134*  K 3.5 3.3* 4.6 5.8*  CL 98 97* 101 103  CO2 28 30 25  18*  GLUCOSE 160* 103* 99 138*  BUN 20 11 40* 80*  CALCIUM  8.4* 8.6* 8.8* 9.3  CREATININE 4.71* 4.13* 10.88* 17.85*  GFRNONAA 12* 14* 5* 2*    LIVER FUNCTION TESTS: Recent Labs    09/14/23 0500 02/16/24 0756  BILITOT 0.5 0.6  AST 22 21  ALT 30 25  ALKPHOS 75 53  PROT 6.9 7.3   ALBUMIN  3.2* 3.4*    TUMOR MARKERS: No results for input(s): "AFPTM", "CEA", "CA199", "CHROMGRNA" in the last 8760 hours.  Assessment and Plan: Patient is dialyzed on Tues/Thurs/Sat. Per patient report, patient was unable to proceed with treatment this morning due to a clogged or clotted fistula. Patient was transported to ED for further evaluation. EDP, Dr. Reba Camper, requested Interventional Radiology for fistulogram with possible intervention.   All labs and medications are within acceptable parameters. No pertinent allergies. Patient has been NPO since dinner last night.   Patient presents for tentatively scheduled fistulogram with possible intervention in IR today.  Risks and benefits of the dialysis circuit study were discussed with the patient including, but not limited to bleeding, infection, vascular injury, and inability to improve the function of the fistula/graft which would require placement of a tunneled dialysis catheter.  All of the patient's questions were answered, patient is agreeable to proceed.  Consent given by phone my Kelle Pate, patient's assigned social worker and legal guardian's supervisor, contacted at [phone]. Consent is in IR at charge RN desk.      Thank you for allowing  our service to participate in Zachary [name] 's care.  Electronically Signed: Lovena Rubinstein, PA-C   02/16/2024, 2:15 PM      I spent a total of 40 Minutes in face to face in clinical consultation, greater than 50% of which was counseling/coordinating care for clotted hemodialysis fistula, with consideration for fistulogram and possible intervention.

## 2024-02-16 NOTE — ED Provider Notes (Signed)
  EMERGENCY DEPARTMENT AT [facility] Provider Note   CSN: [id number] Arrival date & time: 02/16/24  0715     History  Chief Complaint  Patient presents with   Vascular Access Problem    Zachary [name] is a 78 y.o. male.  HPI Patient presents for vascular access problem.  Medical history includes ESRD, DM, colon cancer, anemia, intellectual disability, GERD, HLD.  He undergoes dialysis on Tuesday, Thursday, Saturday.  He did receive a full session of dialysis on Saturday.  He went this morning but was told that his dialysis port is clogged.  He was unable to initiate dialysis treatments.  Patient denies any recent physical symptoms.  Per chart review, he had left upper arm fistula placed by Dr. Shirley Douglas 3 years ago.  He had subsequent angioplasty 2 years ago.    Home Medications Prior to Admission medications   Medication Sig Start Date End Date Taking? Authorizing Provider  aspirin  EC 81 MG tablet Take 81 mg by mouth daily. 0900    [provider]  Insulin  Glargine (BASAGLAR  KWIKPEN) 100 UNIT/ML Inject 8 Units into the skin at bedtime. 2200    [provider]  Insulin  Pen Needle (BD AUTOSHIELD DUO) 30G X 5 MM MISC by Does not apply route. 3/16"    [provider]  levothyroxine  (SYNTHROID ) 125 MCG tablet Take 125 mcg by mouth at bedtime. 10 am, Special Instructions: Must be given (1) hour apart from food or other medications per pharmacy recommendations    [provider]  midodrine  (PROAMATINE ) 10 MG tablet Take 10 mg by mouth See admin instructions. Send med with resident on  Tues/Thurs/Sat to dialysis. do not give at [facility], give to resident to take with him. send with 5mg  to equal 15mg . 6:00 06/01/19   [provider]  midodrine  (PROAMATINE ) 5 MG tablet Take 5 mg by mouth See admin instructions. Send with resident on dialysis days.  Tues/Thurs/Sat  Take along with 10 mg to equal 15 mg 6:00 06/01/19   [provider]  NON FORMULARY Diet: Regular diet    [provider]  Nutritional Supplements (,FEEDING SUPPLEMENT, PROSOURCE PLUS) liquid Take 30 mLs by mouth 2 (two) times daily between meals. 1600 and 2200    [provider]  omega-3 acid ethyl esters (LOVAZA ) 1 g capsule Take 2 g by mouth at bedtime. 2200 06/03/19   [provider]  omeprazole (PRILOSEC) 40 MG capsule Take 40 mg by mouth at bedtime. 2200 01/23/20   [provider]  polyethylene glycol (MIRALAX  / GLYCOLAX ) packet Take 17 g by mouth daily as needed for mild constipation.  05/27/20   [provider]  rosuvastatin  (CRESTOR ) 20 MG tablet Take 20 mg by mouth daily.    [provider]  sevelamer  carbonate (RENVELA ) 800 MG tablet Take 3 tablets (2,400 mg total) by mouth 3 (three) times daily with meals. 10/03/22   Ghimire, Estil Heman, MD  tamsulosin  (FLOMAX ) 0.4 MG CAPS capsule Take 0.4 mg by mouth at bedtime. Give 30 minutes after a meal. Do not crush or chew    [provider]  torsemide  (DEMADEX ) 20 MG tablet Take 20 mg by mouth See admin instructions. 20mg  daily at 2000 on Sunday, Monday, Wednesday, and Friday only.    [provider]  vitamin B-12 (CYANOCOBALAMIN ) 1000 MCG tablet Take 1,000 mcg by mouth at bedtime.    [provider]      Allergies    Patient  has no known allergies.    Review of Systems   Review of Systems  Respiratory:  Negative for shortness of breath.   Cardiovascular:  Negative for chest pain.  All other systems reviewed and are negative.   Physical Exam Updated Vital Signs BP (!) 193/84   Pulse 90   Resp 14   SpO2 100%  Physical Exam Vitals and nursing note reviewed.  Constitutional:      General: He is not in acute distress.    Appearance: Normal appearance. He is well-developed. He is not ill-appearing, toxic-appearing or diaphoretic.  HENT:     Head: Normocephalic and atraumatic.     Right Ear: External ear normal.     Left  Ear: External ear normal.     Nose: Nose normal.     Mouth/Throat:     Mouth: Mucous membranes are moist.  Eyes:     Extraocular Movements: Extraocular movements intact.     Conjunctiva/sclera: Conjunctivae normal.  Cardiovascular:     Rate and Rhythm: Normal rate and regular rhythm.     Heart sounds: No murmur heard.    Comments: Bounding pulse present in fistula.  Thrill is difficult to appreciate. Pulmonary:     Effort: Pulmonary effort is normal. No respiratory distress.     Breath sounds: Normal breath sounds.  Abdominal:     General: There is no distension.     Palpations: Abdomen is soft.     Tenderness: There is no abdominal tenderness.  Musculoskeletal:        General: No swelling. Normal range of motion.     Cervical back: Normal range of motion and neck supple.     Right lower leg: No edema.     Left lower leg: No edema.  Skin:    General: Skin is warm and dry.     Coloration: Skin is not jaundiced or pale.  Neurological:     General: No focal deficit present.     Mental Status: He is alert and oriented to person, place, and time.  Psychiatric:        Mood and Affect: Mood normal.        Behavior: Behavior normal.     ED Results / Procedures / Treatments   Labs (all labs ordered are listed, but only abnormal results are displayed) Labs Reviewed  CBC WITH DIFFERENTIAL/PLATELET - Abnormal; Notable for the following components:      Result Value   RBC 2.67 (*)    Hemoglobin 9.3 (*)    HCT 27.9 (*)    MCV 104.5 (*)    MCH 34.8 (*)    Platelets 81 (*)    All other components within normal limits  COMPREHENSIVE METABOLIC PANEL WITH GFR - Abnormal; Notable for the following components:   Sodium 134 (*)    Potassium 5.8 (*)    CO2 18 (*)    Glucose, Bld 138 (*)    BUN 80 (*)    Creatinine, Ser 17.85 (*)    Albumin  3.4 (*)    GFR, Estimated 2 (*)    All other components within normal limits    EKG None  Radiology No results  found.  Procedures Procedures    Medications Ordered in ED Medications  sodium zirconium cyclosilicate  (LOKELMA ) packet 10 g (has no administration in time range)  albuterol  (PROVENTIL ) (2.5 MG/3ML) 0.083% nebulizer solution 2.5 mg (has no administration in time range)  insulin  aspart (novoLOG ) injection 5 Units (has no administration in time  range)    And  dextrose  50 % solution 50 mL (has no administration in time range)    ED Course/ Medical Decision Making/ A&P                                 Medical Decision Making Amount and/or Complexity of Data Reviewed Labs: ordered.  Risk OTC drugs. Prescription drug management.   This patient presents to the ED for concern of vascular access problem, this involves an extensive number of treatment options, and is a complaint that carries with it a high risk of complications and morbidity.  The differential diagnosis includes clotted fistula, other fistula malfunction, metabolic derangements   Co morbidities that complicate the patient evaluation  ESRD, DM, colon cancer, anemia, intellectual disability, GERD, HLD   Additional history obtained:  Additional history obtained from N/A External records from outside source obtained and reviewed including EMR   Lab Tests:  I Ordered, and personally interpreted labs.  The pertinent results include: Hyperkalemia is present.  Azotemia and acidosis are worsened from baseline.  Cardiac Monitoring: / EKG:  The patient was maintained on a cardiac monitor.  I personally viewed and interpreted the cardiac monitored which showed an underlying rhythm of: Sinus rhythm   Problem List / ED Course / Critical interventions / Medication management  Patient presents for vascular access problem.  He has a left upper arm fistula that was unable to be accessed today at dialysis.  He denies any physical complaints.  Last dialysis session was on Saturday.  Patient is well-appearing on arrival.  Vital  signs are notable for hypertension.  Based on chart review, he does not appear to be on any blood pressure medications.  It appears he actually takes midodrine .  On palpation of left upper arm fistula, pulses present without thrill.  I spoke with vascular surgeon on-call, Dr. Vikki Graves, who feels that patient likely needs a fistulogram.  If he does need urgent dialysis, this can be done today following ED to ED transfer to [facility].  If his labs are reassuring, this can also be done as an outpatient and they can schedule him for tomorrow.  Lab workup was initiated.  Lab work is notable for hyperkalemia and worsening azotemia and acidosis from baseline.  Temporizing occasions was ordered for hyperkalemia.  Patient would benefit from dialysis today.  Patient to be transferred to [facility], ED for vascular surgery evaluation.  Patient was accepted in transfer by Dr. Liam Redhead. I ordered medication including Lokelma , albuterol , insulin /dextrose  for hyperkalemia Reevaluation of the patient after these medicines showed that the patient improved I have reviewed the patients home medicines and have made adjustments as needed   Consultations Obtained:  I requested consultation with the vascular surgeon, Dr. Vikki Graves,  and discussed lab and imaging findings as well as pertinent plan - they recommend: ED to ED transfer to [facility]   Social Determinants of Health:  Resides in nursing facility        Final Clinical Impression(s) / ED Diagnoses Final diagnoses:  Problem with vascular access  Hyperkalemia    Rx / DC Orders ED Discharge Orders     None         Iva Mariner, MD 02/16/24 [phone]

## 2024-02-16 NOTE — Progress Notes (Signed)
 Brief nephrology note:  I received a call from ER provider that patient will need dialysis.  Apparently he went to HD center where he was noted to have access clotted therefore he was sent to [facility] for possible declotting.  IR was consulted and now he is in IR for the procedure.  I was able to see him while he was going to IR. I reviewed the lab results, potassium level 5.8.  BP elevated. We will plan to do dialysis tonight after access declotting versus catheter placement.  Patient will likely discharge home after receiving dialysis. Full consult if patient is being admitted.  Zachary [name], [facility].

## 2024-02-16 NOTE — ED Notes (Signed)
[facility] called to transport patient. Nurse aware.

## 2024-02-16 NOTE — ED Notes (Signed)
 Patient transported to IR. Calm, cooperative with even unlabored respirations on transport, no distress noted.

## 2024-02-16 NOTE — ED Provider Notes (Addendum)
 Patient seen on arrival.  IR aware of issues with patient's dialysis access.  IR is planning to take the patient this afternoon for intervention.  Nephrology Edson Graces) made aware of patient's need for dialysis.   Burnette Carte, MD 02/16/24 1205    Burnette Carte, MD 02/16/24 [phone]

## 2024-02-16 NOTE — ED Provider Notes (Signed)
 Waiting for pt to return from IR for declotting of graft.  Will contact nephro when pt returns.  Patient is back from IR.  Will notify nephrology so he can have dialysis prior to discharge.   Almond Army, MD 02/17/24 [phone]

## 2024-02-17 ENCOUNTER — Emergency Department (HOSPITAL_COMMUNITY)

## 2024-02-17 ENCOUNTER — Encounter: Payer: Self-pay | Admitting: Adult Health

## 2024-02-17 DIAGNOSIS — T8249XA Other complication of vascular dialysis catheter, initial encounter: Secondary | ICD-10-CM | POA: Diagnosis not present

## 2024-02-17 DIAGNOSIS — R0602 Shortness of breath: Secondary | ICD-10-CM | POA: Diagnosis not present

## 2024-02-17 DIAGNOSIS — T82868A Thrombosis of vascular prosthetic devices, implants and grafts, initial encounter: Secondary | ICD-10-CM | POA: Diagnosis not present

## 2024-02-17 HISTORY — PX: IR US GUIDE VASC ACCESS LEFT: IMG2389

## 2024-02-17 LAB — BASIC METABOLIC PANEL WITH GFR
Anion gap: 17 — ABNORMAL HIGH (ref 5–15)
BUN: 38 mg/dL — ABNORMAL HIGH (ref 8–23)
CO2: 23 mmol/L (ref 22–32)
Calcium: 8.9 mg/dL (ref 8.9–10.3)
Chloride: 96 mmol/L — ABNORMAL LOW (ref 98–111)
Creatinine, Ser: 10.37 mg/dL — ABNORMAL HIGH (ref 0.61–1.24)
GFR, Estimated: 5 mL/min — ABNORMAL LOW (ref >60)
Glucose, Bld: 110 mg/dL — ABNORMAL HIGH (ref 70–99)
Potassium: 3.7 mmol/L (ref 3.5–5.1)
Sodium: 136 mmol/L (ref 135–145)

## 2024-02-17 LAB — GLUCOSE, CAPILLARY
Glucose-Capillary: 78 mg/dL (ref 70–99)
Glucose-Capillary: 103 mg/dL — ABNORMAL HIGH (ref 70–99)
Glucose-Capillary: 110 mg/dL — ABNORMAL HIGH (ref 70–99)

## 2024-02-17 MED ORDER — ASPIRIN 81 MG PO TBEC
81.0000 mg | DELAYED_RELEASE_TABLET | Freq: Every day | ORAL | Status: DC
  Administered 2024-02-17 – 2024-02-18 (×2): 81 mg via ORAL
  Filled 2024-02-17 (×2): qty 1

## 2024-02-17 MED ORDER — TAMSULOSIN HCL 0.4 MG PO CAPS
0.4000 mg | ORAL_CAPSULE | Freq: Every day | ORAL | Status: DC
  Administered 2024-02-17: 0.4 mg via ORAL
  Filled 2024-02-17: qty 1

## 2024-02-17 MED ORDER — MIDODRINE HCL 5 MG PO TABS
5.0000 mg | ORAL_TABLET | Freq: Three times a day (TID) | ORAL | Status: DC
  Administered 2024-02-17 – 2024-02-18 (×3): 5 mg via ORAL
  Filled 2024-02-17 (×3): qty 1

## 2024-02-17 MED ORDER — INSULIN ASPART 100 UNIT/ML IJ SOLN: 0.0000 [IU] | Freq: Three times a day (TID) | INTRAMUSCULAR | Status: DC

## 2024-02-17 MED ORDER — HEPARIN SODIUM (PORCINE) 5000 UNIT/ML IJ SOLN
5000.0000 [IU] | Freq: Three times a day (TID) | INTRAMUSCULAR | Status: DC
  Administered 2024-02-17 (×2): 5000 [IU] via SUBCUTANEOUS
  Filled 2024-02-17 (×2): qty 1

## 2024-02-17 MED ORDER — SODIUM CHLORIDE 0.9 % IV BOLUS
250.0000 mL | Freq: Once | INTRAVENOUS | Status: AC
  Administered 2024-02-17: 250 mL via INTRAVENOUS

## 2024-02-17 MED ORDER — CHLORHEXIDINE GLUCONATE CLOTH 2 % EX PADS
6.0000 | MEDICATED_PAD | Freq: Every day | CUTANEOUS | Status: DC
  Administered 2024-02-18: 6 via TOPICAL

## 2024-02-17 MED ORDER — VITAMIN B-12 1000 MCG PO TABS
1000.0000 ug | ORAL_TABLET | Freq: Every day | ORAL | Status: DC
  Administered 2024-02-17 – 2024-02-18 (×2): 1000 ug via ORAL
  Filled 2024-02-17 (×2): qty 1

## 2024-02-17 MED ORDER — MIDODRINE HCL 5 MG PO TABS
10.0000 mg | ORAL_TABLET | Freq: Once | ORAL | Status: AC
  Administered 2024-02-17: 10 mg via ORAL
  Filled 2024-02-17: qty 2

## 2024-02-17 MED ORDER — LEVOTHYROXINE SODIUM 25 MCG PO TABS
125.0000 ug | ORAL_TABLET | Freq: Every day | ORAL | Status: DC
  Administered 2024-02-17: 125 ug via ORAL
  Filled 2024-02-17: qty 1

## 2024-02-17 MED ORDER — PANTOPRAZOLE SODIUM 40 MG PO TBEC
40.0000 mg | DELAYED_RELEASE_TABLET | Freq: Every day | ORAL | Status: DC
  Administered 2024-02-17: 40 mg via ORAL
  Filled 2024-02-17: qty 1

## 2024-02-17 MED ORDER — INSULIN ASPART 100 UNIT/ML IJ SOLN: 0.0000 [IU] | Freq: Every day | INTRAMUSCULAR | Status: DC

## 2024-02-17 MED ORDER — ROSUVASTATIN CALCIUM 20 MG PO TABS
20.0000 mg | ORAL_TABLET | Freq: Every day | ORAL | Status: DC
Start: 2024-02-17 — End: 2024-02-18
  Administered 2024-02-17 – 2024-02-18 (×2): 20 mg via ORAL
  Filled 2024-02-17 (×2): qty 1

## 2024-02-17 MED ORDER — SEVELAMER CARBONATE 800 MG PO TABS
2400.0000 mg | ORAL_TABLET | Freq: Three times a day (TID) | ORAL | Status: DC
  Administered 2024-02-17 – 2024-02-18 (×3): 2400 mg via ORAL
  Filled 2024-02-17 (×3): qty 3

## 2024-02-17 NOTE — H&P (Signed)
 History and Physical    Patient: Zachary [name] ZOX:[id number] DOB: [birth year] DOA: 02/16/2024 DOS: the patient was seen and examined on 02/17/2024 PCP: Zachary Shu, NP    Chief Complaint:  Chief Complaint  Patient presents with   Vascular Access Problem   HPI: Zachary [name] is a 78 y.o. male with medical history significant of ESRD, DM, intellectual disability (ward of [facility]), anemia, GERD, HLD who presents with a dialysis access problem. Pt was seen by [facility] staff on 02/16/2024 who performed US  guided access LUE HD circuit x 2, Fistulagram, Mechanical and pharmacologic thrombectomy. IR was able to restore the pt's access (excellent flow and thrill).  Pt did undergo dialysis early AM on 02/17/2024. However, after dialysis he was noted to be hypotensive and tachycardic requiring small IV fluid bolus and midodrine . Thus, he will be placed on the medicine service under observation.   Review of Systems: Review of Systems  Constitutional:  Negative for chills and fever.  HENT:  Negative for hearing loss.   Eyes:  Negative for blurred vision.  Respiratory:  Negative for cough.   Cardiovascular:  Negative for chest pain.  Gastrointestinal:  Negative for nausea and vomiting.  Genitourinary:  Negative for dysuria.  Musculoskeletal:  Negative for myalgias.  Skin:  Negative for rash.  Neurological:  Negative for dizziness and headaches.  Endo/Heme/Allergies:  Negative for polydipsia.  Psychiatric/Behavioral:  Negative for depression.     Past Medical History:  Diagnosis Date   Abnormal CT scan, kidney 10/06/2011   Acute pyelonephritis 10/07/2011   Anemia    normocytic   Anxiety    mental retardation   Bladder wall thickening 10/06/2011   BPH (benign prostatic hypertrophy)    Diabetes mellitus    Dialysis patient (HCC)    Tuesday, Thursday and Saturday,    DVT of leg (deep venous thrombosis) (HCC) 12/25/2016   Edema     history of lower  extremity edema   GERD (gastroesophageal reflux disease)    Heme positive stool    Hydronephrosis    Hyperkalemia    Hyperlipidemia    Hypernatremia    Hypertension    Hypothyroidism    Impaired speech    Infected prosthetic vascular graft (HCC)    MR (mental retardation)    Muscle weakness    Obstructive uropathy    Perinephric abscess 10/07/2011   Poor historian poor historian   Primary colorectal adenocarcinoma (HCC) 04/2017   S/p total colectomy   Protein calorie malnutrition (HCC)    Pyelonephritis    Renal failure (ARF), acute on chronic (HCC)    Renal insufficiency    chronic history   Sepsis (HCC)    Smoking    Uremia    Urinary retention    UTI (lower urinary tract infection) 10/06/2011   Past Surgical History:  Procedure Laterality Date   A/V FISTULAGRAM N/A 08/13/2018   Procedure: A/V FISTULAGRAM - Right Upper;  Surgeon: Richrd Char, MD;  Location: [facility] INVASIVE CV LAB;  Service: Cardiovascular;  Laterality: N/A;   A/V FISTULAGRAM N/A 11/22/2018   Procedure: A/V FISTULAGRAM - Right Upper;  Surgeon: Adine Hoof, MD;  Location: [facility] INVASIVE CV LAB;  Service: Cardiovascular;  Laterality: N/A;   A/V FISTULAGRAM Left 10/08/2022   Procedure: A/V Fistulagram;  Surgeon: Kayla Part, MD;  Location: [facility] INVASIVE CV LAB;  Service: Cardiovascular;  Laterality: Left;   A/V FISTULAGRAM Left 05/06/2023   Procedure: A/V Fistulagram;  Surgeon: Carlene Che, MD;  Location: [facility] INVASIVE CV LAB;  Service: Cardiovascular;  Laterality: Left;   AV FISTULA PLACEMENT Left 07/06/2015   Procedure:  INSERTION LEFT ARM ARTERIOVENOUS GORTEX GRAFT;  Surgeon: Dannis Dy, MD;  Location: [facility] OR;  Service: Vascular;  Laterality: Left;   AV FISTULA PLACEMENT Right 02/26/2016   Procedure: ARTERIOVENOUS (AV) FISTULA CREATION ;  Surgeon: Dannis Dy, MD;  Location: [facility] OR;  Service: Vascular;  Laterality: Right;   AV FISTULA PLACEMENT Right 11/25/2018    Procedure: INSERTION OF ARTERIOVENOUS (AV) ARTEGRAFT RIGHT UPPER ARM;  Surgeon: Adine Hoof, MD;  Location: [facility] OR;  Service: Vascular;  Laterality: Right;   AV FISTULA PLACEMENT Left 05/28/2021   Procedure: LEFT ARM ARTERIOVENOUS (AV) FISTULA;  Surgeon: Mayo Speck, MD;  Location: [facility] ORS;  Service: Vascular;  Laterality: Left;   AVGG REMOVAL Left 10/09/2015   Procedure: REMOVAL OF ARTERIOVENOUS GORETEX GRAFT (AVGG) Evacuation of Lymphocele, Vein Patch angioplasty of brachial artery.;  Surgeon: Dannis Dy, MD;  Location: [facility] OR;  Service: Vascular;  Laterality: Left;   BASCILIC VEIN TRANSPOSITION Right 02/26/2016   Procedure: Right BASCILIC VEIN TRANSPOSITION;  Surgeon: Dannis Dy, MD;  Location: [facility] OR;  Service: Vascular;  Laterality: Right;   BASCILIC VEIN TRANSPOSITION Left 07/30/2021   Procedure: LEFT ARM SECOND STAGE BASILIC VEIN TRANSPOSITION;  Surgeon: Mayo Speck, MD;  Location: [facility]S;  Service: Vascular;  Laterality: Left;   CIRCUMCISION N/A 01/04/2014   Procedure: CIRCUMCISION ADULT (procedure #1);  Surgeon: Mohammad I Javaid, MD;  Location: [facility] OR[facility];  Service: Urology;  Laterality: N/A;   COLECTOMY N/A 05/04/2017   Procedure: TOTAL COLECTOMY;  Surgeon: Alanda Allegra, MD;  Location: [facility] ORS;  Service: General;  Laterality: N/A;   COLONOSCOPY N/A 04/27/2017   Surgeon: Suzette Espy, MD; annular mass in the ascending colon likely representing cancer biopsied, multiple 6-22 mm polyps removed, clean rectum.  Pathology with multiple tubular adenomas, high-grade dysplasia noted in ascending colon and splenic flexure.   CYSTOSCOPY W/ RETROGRADES Bilateral 06/29/2015   Procedure: CYSTOSCOPY, DILATION OF URETHRAL STRICTURE WITH BILATERAL RETROGRADE PYELOGRAM,SUPRAPUBIC TUBE CHANGE;  Surgeon: Christina Coyer, MD;  Location: [facility] ORS;  Service: Urology;  Laterality: Bilateral;   CYSTOSCOPY WITH URETHRAL DILATATION N/A 12/29/2013   Procedure: CYSTOSCOPY WITH  URETHRAL DILATATION;  Surgeon: Reggie Caper, MD;  Location: [facility];  Service: Urology;  Laterality: N/A;   ESOPHAGOGASTRODUODENOSCOPY N/A 04/27/2017   Procedure: ESOPHAGOGASTRODUODENOSCOPY (EGD);  Surgeon: Suzette Espy, MD;  Location: [facility];  Service: Endoscopy;  Laterality: N/A;   FLEXIBLE SIGMOIDOSCOPY N/A 12/02/2021   Procedure: FLEXIBLE SIGMOIDOSCOPY;  Surgeon: Vinetta Greening, DO;  Location: [facility];  Service: Endoscopy;  Laterality: N/A;  10:30am, dialysis patient   INSERTION OF DIALYSIS CATHETER Right 11/25/2018   Procedure: INSERTION OF DIALYSIS CATHETER RIGHT INTERNAL JUGULAR;  Surgeon: Adine Hoof, MD;  Location: [facility] OR;  Service: Vascular;  Laterality: Right;   IR AV DIALY SHUNT INTRO NEEDLE/INTRACATH INITIAL W/PTA/IMG RIGHT Right 09/07/2018   IR FLUORO GUIDE CV LINE RIGHT  10/16/2020   IR FLUORO GUIDE CV LINE RIGHT  10/03/2022   IR REMOVAL TUN CV CATH W/O FL  01/12/2019   IR THROMBECTOMY AV FISTULA W/THROMBOLYSIS/PTA INC/SHUNT/IMG RIGHT Right 04/26/2018   IR US  GUIDE VASC ACCESS RIGHT  04/26/2018   IR US  GUIDE VASC ACCESS RIGHT  09/07/2018   IR US  GUIDE VASC ACCESS RIGHT  10/16/2020   IR US  GUIDE VASC ACCESS  RIGHT  10/16/2020   IR US  GUIDE VASC ACCESS RIGHT  10/03/2022   ORIF FEMUR FRACTURE Right 11/22/2016   Procedure: OPEN REDUCTION INTERNAL FIXATION (ORIF) DISTAL FEMUR FRACTURE;  Surgeon: Adonica Hoose, MD;  Location: [facility] OR;  Service: Orthopedics;  Laterality: Right;   PATCH ANGIOPLASTY Right 12/10/2017   Procedure: PATCH ANGIOPLASTY;  Surgeon: Dannis Dy, MD;  Location: [facility] OR;  Service: Vascular;  Laterality: Right;   PERIPHERAL VASCULAR BALLOON ANGIOPLASTY  08/13/2018   Procedure: PERIPHERAL VASCULAR BALLOON ANGIOPLASTY;  Surgeon: Richrd Char, MD;  Location: [facility] INVASIVE CV LAB;  Service: Cardiovascular;;  right AV fistula    PERIPHERAL VASCULAR BALLOON ANGIOPLASTY  11/22/2018   Procedure: PERIPHERAL VASCULAR BALLOON  ANGIOPLASTY;  Surgeon: Adine Hoof, MD;  Location: [facility] INVASIVE CV LAB;  Service: Cardiovascular;;  rt AV fistula   PERIPHERAL VASCULAR BALLOON ANGIOPLASTY  10/08/2022   Procedure: PERIPHERAL VASCULAR BALLOON ANGIOPLASTY;  Surgeon: Kayla Part, MD;  Location: [facility] INVASIVE CV LAB;  Service: Cardiovascular;;  arterial anastamosis   PERIPHERAL VASCULAR CATHETERIZATION N/A 10/08/2015   Procedure: A/V Shuntogram;  Surgeon: Dannis Dy, MD;  Location: [facility] INVASIVE CV LAB;  Service: Cardiovascular;  Laterality: N/A;   PERIPHERAL VASCULAR INTERVENTION Left 10/08/2022   Procedure: PERIPHERAL VASCULAR INTERVENTION;  Surgeon: Kayla Part, MD;  Location: [facility] INVASIVE CV LAB;  Service: Cardiovascular;  Laterality: Left;  left AVF   REMOVAL OF A DIALYSIS CATHETER N/A 12/03/2021   Procedure: MINOR REMOVAL OF A TUNNELED DIALYSIS CATHETER;  Surgeon: Mayo Speck, MD;  Location: [facility];  Service: Vascular;  Laterality: N/A;   REMOVAL OF A DIALYSIS CATHETER N/A 11/18/2022   Procedure: MINOR REMOVAL OF A TUNNELED DIALYSIS CATHETER;  Surgeon: Mayo Speck, MD;  Location: [facility];  Service: Vascular;  Laterality: N/A;   THROMBECTOMY W/ EMBOLECTOMY Right 12/10/2017   Procedure: THROMBECTOMY REVISION RIGHT ARM  ARTERIOVENOUS FISTULA;  Surgeon: Dannis Dy, MD;  Location: [facility] OR;  Service: Vascular;  Laterality: Right;   TRANSURETHRAL RESECTION OF PROSTATE N/A 01/04/2014   Procedure: TRANSURETHRAL RESECTION OF THE PROSTATE (TURP) (procedure #2);  Surgeon: Mohammad I Javaid, MD;  Location: [facility];  Service: Urology;  Laterality: N/A;   Social History:  reports that he has never smoked. He has never used smokeless tobacco. He reports that he does not drink alcohol and does not use drugs.  No Known Allergies  Family History  Problem Relation Age of Onset   Cancer Mother    Colon cancer Neg Hx     Prior to Admission medications   Medication Sig Start Date End Date Taking?  Authorizing Provider  aspirin  EC 81 MG tablet Take 81 mg by mouth daily. 0900    [provider]  Insulin  Glargine (BASAGLAR  KWIKPEN) 100 UNIT/ML Inject 8 Units into the skin at bedtime. 2200    [provider]  Insulin  Pen Needle (BD AUTOSHIELD DUO) 30G X 5 MM MISC by Does not apply route. 3/16"    [provider]  levothyroxine  (SYNTHROID ) 125 MCG tablet Take 125 mcg by mouth at bedtime. 10 am, Special Instructions: Must be given (1) hour apart from food or other medications per pharmacy recommendations    [provider]  midodrine  (PROAMATINE ) 10 MG tablet Take 10 mg by mouth See admin instructions. Send med with resident on  Tues/Thurs/Sat to dialysis. do not give at [facility], give to resident to take with him. send with 5mg  to equal 15mg . 6:00 06/01/19  [provider]  midodrine  (PROAMATINE ) 5 MG tablet Take 5 mg by mouth See admin instructions. Send with resident on dialysis days.  Tues/Thurs/Sat  Take along with 10 mg to equal 15 mg 6:00 06/01/19   [provider]  NON FORMULARY Diet: Regular diet    [provider]  Nutritional Supplements (,FEEDING SUPPLEMENT, PROSOURCE PLUS) liquid Take 30 mLs by mouth 2 (two) times daily between meals. 1600 and 2200    [provider]  omega-3 acid ethyl esters (LOVAZA ) 1 g capsule Take 2 g by mouth at bedtime. 2200 06/03/19   [provider]  omeprazole (PRILOSEC) 40 MG capsule Take 40 mg by mouth at bedtime. 2200 01/23/20   [provider]  polyethylene glycol (MIRALAX  / GLYCOLAX ) packet Take 17 g by mouth daily as needed for mild constipation.  05/27/20   [provider]  rosuvastatin  (CRESTOR ) 20 MG tablet Take 20 mg by mouth daily.    [provider]  sevelamer  carbonate (RENVELA ) 800 MG tablet Take 3 tablets (2,400 mg total) by mouth 3 (three) times daily with meals. 10/03/22   Ghimire, Estil Heman, MD  tamsulosin  (FLOMAX ) 0.4 MG CAPS capsule Take 0.4  mg by mouth at bedtime. Give 30 minutes after a meal. Do not crush or chew    [provider]  torsemide  (DEMADEX ) 20 MG tablet Take 20 mg by mouth See admin instructions. 20mg  daily at 2000 on Sunday, Monday, Wednesday, and Friday only.    [provider]  vitamin B-12 (CYANOCOBALAMIN ) 1000 MCG tablet Take 1,000 mcg by mouth at bedtime.    [provider]    Physical Exam: Vitals:   02/17/24 0100 02/17/24 0130 02/17/24 0244 02/17/24 0521  BP: 98/61 103/63 99/63 127/73  Pulse: 97 89 (!) 113 85  Resp: 11 15 20 18   Temp:   98 F (36.7 C) 98.2 F (36.8 C)  TempSrc:   Oral   SpO2: 100% 100% 100% 100%   Physical Exam Constitutional:      Appearance: Normal appearance.  HENT:     Head: Normocephalic.  Cardiovascular:     Rate and Rhythm: Tachycardia present.  Pulmonary:     Effort: Pulmonary effort is normal.  Abdominal:     General: There is distension.     Palpations: Abdomen is soft.  Musculoskeletal:        General: Normal range of motion.     Cervical back: Neck supple.  Skin:    General: Skin is warm.     Comments: LUE fistula wrapped  Neurological:     Mental Status: He is alert. Mental status is at baseline.  Psychiatric:        Mood and Affect: Mood normal.      Assessment and Plan: Dialysis vascular access problem (L upper ext)  - s/p intervention by IR at [facility]   Hypotension s/p dialysis  - Cardiac monitoring  - Midodrine  5 mg PO tid   ESRD on dialysis  - Dialysis per nephro  - Renvela  2400 mg PO tid  - Renal diet   DM - Novolog  SS ACHS  - ASA 81 mg PO daily   Intellectual disability  - Complicating care; pt is a ward of [facility]  GERD  - Protonix  40 mg PO daily   HLD - Crestor  20 mg PO daily   Hypothyroidism  - Synthroid  125 mcg PO daily   Consults: IR and nephrology  Severity of Illness: The  appropriate patient status for this patient is OBSERVATION. Observation status is judged to  be reasonable and necessary in order to provide the required intensity of service to ensure the patient's safety. [name] presenting symptoms, physical exam findings, and initial radiographic and laboratory data in the context of their medical condition is felt to place them at decreased risk for further clinical deterioration. Furthermore, it is anticipated that the patient will be medically stable for discharge from the hospital within 2 midnights of admission.   Author: Jaydence Vanyo , MD 02/17/2024 8:59 AM  For on call review [email or web address].

## 2024-02-17 NOTE — ED Provider Notes (Signed)
 Patient has returned from dialysis.  They were only able to take off 1.5 L secondary to hypotension.  He denies chest pain or shortness of breath.  IR did perform fistulogram and mechanical thrombectomy.  Patient states he feels well however he is hypotensive and tachycardic.  Blood pressures 88 systolic with a heart rate of 409.  He has positive orthostatics and is unsteady on his feet.  He will given a small fluid challenge.  Repeat labs, hyperkalemia has resolved.  Patient tachycardic to the 120s with standing.  Blood pressure dropping to the 80s.  Denies dizziness or lightheadedness.  Small fluid bolus given as well as midodrine .  Remains hypotensive in the 80s and 90s.  Heart rate improving with small fluid bolus.  Given his ongoing tachycardia and hypotension we will plan observation.  Discussed with Dr. Andy Bannister.  Chest x-ray added.  No fever or other infectious symptoms at this time.     Zachary Gloss, MD 02/17/24 [id number]

## 2024-02-17 NOTE — Progress Notes (Signed)
   02/17/24 0130  Vitals  BP 103/63  MAP (mmHg) 75  BP Location Right Arm  BP Method Automatic  Patient Position (if appropriate) Lying  Pulse Rate 89  Pulse Rate Source Monitor  ECG Heart Rate 88  Resp 15  Oxygen Therapy  SpO2 100 %  During Treatment Monitoring  Blood Flow Rate (mL/min) 0 mL/min  Arterial Pressure (mmHg) 84.04 mmHg  Venous Pressure (mmHg) 45.05 mmHg  TMP (mmHg) 19.8 mmHg  Ultrafiltration Rate (mL/min) 632 mL/min  Dialysate Flow Rate (mL/min) 299 ml/min  Dialysate Potassium Concentration 2  Dialysate Calcium  Concentration 2.5  Duration of HD Treatment -hour(s) 3 hour(s)  Cumulative Fluid Removed (mL) per Treatment  1300.13  HD Safety Checks Performed Yes  Intra-Hemodialysis Comments Tx completed  Bolus Amount (mL) 100 mL  Post Treatment  Dialyzer Clearance Lightly streaked  Liters Processed 54  Fluid Removed (mL) 1300 mL  Tolerated HD Treatment Yes  Post-Hemodialysis Comments  (Attainable blood flow without inturruption 350)  AVG/AVF Arterial Site Held (minutes) 10 minutes  AVG/AVF Venous Site Held (minutes) 10 minutes  Fistula / Graft Left Upper arm Arteriovenous fistula  Placement Date/Time: 05/28/21 0807   Orientation: Left  Access Location: Upper arm  Access Type: (c) Arteriovenous fistula  Site Condition No complications  Fistula / Graft Assessment Present;Thrill;Bruit  Status Deaccessed  Drainage Description None

## 2024-02-17 NOTE — Consult Note (Addendum)
 [facility] Nephrology Consult Note: Reason for Consult: To manage dialysis and dialysis related needs Referring Physician: Dr. Mickle Albe  HPI:  Zachary [name] is an 78 y.o. male with past medical history significant for DM, HLD, ESRD on HD who was presented to [facility] ER because of dialysis access malfunction, seen as a consultation to manage ESRD. The patient went to dialysis center where the AV fistula was noted to be clotted.  He was transferred to [facility], ER and was seen by interventional radiologist.  The patient underwent fistulogram, mechanical and pharmacological thrombectomy and angioplasty.  He received dialysis and completed early this morning.  UF was around 1.3 L.  Reported the blood pressure dropped after dialysis therefore he was kept in the hospital for monitoring. The initial labs showed potassium level of 5.8 which was improved to 3.7 after dialysis.  Hemoglobin is around 9-10. Patient is afebrile, blood pressure is acceptable now.  He denies fever, chills, nausea, vomiting, chest pain, shortness of breath.  He has no leukocytosis.  Chest x-ray with no acute finding.  Outpatient HD at [facility] TTS schedule.  AV fistula.   Past Medical History:  Diagnosis Date   Abnormal CT scan, kidney 10/06/2011   Acute pyelonephritis 10/07/2011   Anemia    normocytic   Anxiety    mental retardation   Bladder wall thickening 10/06/2011   BPH (benign prostatic hypertrophy)    Diabetes mellitus    Dialysis patient (HCC)    Tuesday, Thursday and Saturday,    DVT of leg (deep venous thrombosis) (HCC) 12/25/2016   Edema     history of lower extremity edema   GERD (gastroesophageal reflux disease)    Heme positive stool    Hydronephrosis    Hyperkalemia    Hyperlipidemia    Hypernatremia    Hypertension    Hypothyroidism    Impaired speech    Infected prosthetic vascular graft (HCC)    MR (mental retardation)    Muscle weakness    Obstructive  uropathy    Perinephric abscess 10/07/2011   Poor historian poor historian   Primary colorectal adenocarcinoma (HCC) 04/2017   S/p total colectomy   Protein calorie malnutrition (HCC)    Pyelonephritis    Renal failure (ARF), acute on chronic (HCC)    Renal insufficiency    chronic history   Sepsis (HCC)    Smoking    Uremia    Urinary retention    UTI (lower urinary tract infection) 10/06/2011    Past Surgical History:  Procedure Laterality Date   A/V FISTULAGRAM N/A 08/13/2018   Procedure: A/V FISTULAGRAM - Right Upper;  Surgeon: Richrd Char, MD;  [facility]: [facility] INVASIVE CV LAB;  Service: Cardiovascular;  Laterality: N/A;   A/V FISTULAGRAM N/A 11/22/2018   Procedure: A/V FISTULAGRAM - Right Upper;  Surgeon: Adine Hoof, MD;  Location: [facility] INVASIVE CV LAB;  Service: Cardiovascular;  Laterality: N/A;   A/V FISTULAGRAM Left 10/08/2022   Procedure: A/V Fistulagram;  Surgeon: Kayla Part, MD;  Location: [facility] INVASIVE CV LAB;  Service: Cardiovascular;  Laterality: Left;   A/V FISTULAGRAM Left 05/06/2023   Procedure: A/V Fistulagram;  Surgeon: Carlene Che, MD;  Location: [facility] INVASIVE CV LAB;  Service: Cardiovascular;  Laterality: Left;   AV FISTULA PLACEMENT Left 07/06/2015   Procedure:  INSERTION LEFT ARM ARTERIOVENOUS GORTEX GRAFT;  Surgeon: Dannis Dy, MD;  Location: [facility] OR;  Service: Vascular;  Laterality: Left;   AV FISTULA  PLACEMENT Right 02/26/2016   Procedure: ARTERIOVENOUS (AV) FISTULA CREATION ;  Surgeon: Dannis Dy, MD;  Location: [facility] OR;  Service: Vascular;  Laterality: Right;   AV FISTULA PLACEMENT Right 11/25/2018   Procedure: INSERTION OF ARTERIOVENOUS (AV) ARTEGRAFT RIGHT UPPER ARM;  Surgeon: Adine Hoof, MD;  Location: [facility] OR;  Service: Vascular;  Laterality: Right;   AV FISTULA PLACEMENT Left 05/28/2021   Procedure: LEFT ARM ARTERIOVENOUS (AV) FISTULA;  Surgeon: Mayo Speck, MD;  Location: [facility] ORS;  Service:  Vascular;  Laterality: Left;   AVGG REMOVAL Left 10/09/2015   Procedure: REMOVAL OF ARTERIOVENOUS GORETEX GRAFT (AVGG) Evacuation of Lymphocele, Vein Patch angioplasty of brachial artery.;  Surgeon: Dannis Dy, MD;  Location: [facility] OR;  Service: Vascular;  Laterality: Left;   BASCILIC VEIN TRANSPOSITION Right 02/26/2016   Procedure: Right BASCILIC VEIN TRANSPOSITION;  Surgeon: Dannis Dy, MD;  Location: [facility] OR;  Service: Vascular;  Laterality: Right;   BASCILIC VEIN TRANSPOSITION Left 07/30/2021   Procedure: LEFT ARM SECOND STAGE BASILIC VEIN TRANSPOSITION;  Surgeon: Mayo Speck, MD;  Location: [facility] ORS;  Service: Vascular;  Laterality: Left;   CIRCUMCISION N/A 01/04/2014   Procedure: CIRCUMCISION ADULT (procedure #1);  Surgeon: Mohammad I Javaid, MD;  Location: [facility] ORS;  Service: Urology;  Laterality: N/A;   COLECTOMY N/A 05/04/2017   Procedure: TOTAL COLECTOMY;  Surgeon: Alanda Allegra, MD;  Location: [facility] ORS;  Service: General;  Laterality: N/A;   COLONOSCOPY N/A 04/27/2017   Surgeon: Suzette Espy, MD; annular mass in the ascending colon likely representing cancer biopsied, multiple 6-22 mm polyps removed, clean rectum.  Pathology with multiple tubular adenomas, high-grade dysplasia noted in ascending colon and splenic flexure.   CYSTOSCOPY W/ RETROGRADES Bilateral 06/29/2015   Procedure: CYSTOSCOPY, DILATION OF URETHRAL STRICTURE WITH BILATERAL RETROGRADE PYELOGRAM,SUPRAPUBIC TUBE CHANGE;  Surgeon: Christina Coyer, MD;  Location: [facility] ORS;  Service: Urology;  Laterality: Bilateral;   CYSTOSCOPY WITH URETHRAL DILATATION N/A 12/29/2013   Procedure: CYSTOSCOPY WITH URETHRAL DILATATION;  Surgeon: Reggie Caper, MD;  Location: [facility] ORS;  Service: Urology;  Laterality: N/A;   ESOPHAGOGASTRODUODENOSCOPY N/A 04/27/2017   Procedure: ESOPHAGOGASTRODUODENOSCOPY (EGD);  Surgeon: Suzette Espy, MD;  Location: [facility] ENDO [facility];  Service: Endoscopy;  Laterality: N/A;   FLEXIBLE  SIGMOIDOSCOPY N/A 12/02/2021   Procedure: FLEXIBLE SIGMOIDOSCOPY;  Surgeon: Vinetta Greening, DO;  Location: [facility] END[facility];  Service: Endoscopy;  Laterality: N/A;  10:30am, dialysis patient   INSERTION OF DIALYSIS CATHETER Right 11/25/2018   Procedure: INSERTION OF DIALYSIS CATHETER RIGHT INTERNAL JUGULAR;  Surgeon: Adine Hoof, MD;  Location: [facility] OR;  Service: Vascular;  Laterality: Right;   IR AV DIALY SHUNT INTRO NEEDLE/INTRAC INITIAL W/PTA/STENT/IMG LT Left 02/16/2024   IR AV DIALY SHUNT INTRO NEEDLE/INTRACATH INITIAL W/PTA/IMG RIGHT Right 09/07/2018   IR FLUORO GUIDE CV LINE RIGHT  10/16/2020   IR FLUORO GUIDE CV LINE RIGHT  10/03/2022   IR REMOVAL TUN CV CATH W/O FL  01/12/2019   IR THROMBECTOMY AV FISTULA W/THROMBOLYSIS/PTA INC/SHUNT/IMG RIGHT Right 04/26/2018   IR US  GUIDE VASC ACCESS RIGHT  04/26/2018   IR US  GUIDE VASC ACCESS RIGHT  09/07/2018   IR US  GUIDE VASC ACCESS RIGHT  10/16/2020   IR US  GUIDE VASC ACCESS RIGHT  10/16/2020   IR US  GUIDE VASC ACCESS RIGHT  10/03/2022   ORIF FEMUR FRACTURE Right 11/22/2016   Procedure: OPEN REDUCTION INTERNAL FIXATION (ORIF) DISTAL FEMUR FRACTURE;  Surgeon: Adonica Hoose, MD;  Location: [facility] OR;  Service: Orthopedics;  Laterality: Right;   PATCH ANGIOPLASTY Right 12/10/2017   Procedure: PATCH ANGIOPLASTY;  Surgeon: Dannis Dy, MD;  Location: [facility] OR;  Service: Vascular;  Laterality: Right;   PERIPHERAL VASCULAR BALLOON ANGIOPLASTY  08/13/2018   Procedure: PERIPHERAL VASCULAR BALLOON ANGIOPLASTY;  Surgeon: Richrd Char, MD;  [facility] INV[facility] CV LAB;  Service: Cardiovascular;;  right AV fistula    PERIPHERAL VASCULAR BALLOON ANGIOPLASTY  11/22/2018   Procedure: PERIPHERAL VASCULAR BALLOON ANGIOPLASTY;  Surgeon: Adine Hoof, MD;  Location: [facility] INVASIVE CV LAB;  Service: Cardiovascular;;  rt AV fistula   PERIPHERAL VASCULAR BALLOON ANGIOPLASTY  10/08/2022   Procedure: PERIPHERAL VASCULAR BALLOON  ANGIOPLASTY;  Surgeon: Kayla Part, MD;  Location: [facility] INVASIVE CV LAB;  Service: Cardiovascular;;  arterial anastamosis   PERIPHERAL VASCULAR CATHETERIZATION N/A 10/08/2015   Procedure: A/V Shuntogram;  Surgeon: Dannis Dy, MD;  Location: [facility] INVASIVE CV LAB;  Service: Cardiovascular;  Laterality: N/A;   PERIPHERAL VASCULAR INTERVENTION Left 10/08/2022   Procedure: PERIPHERAL VASCULAR INTERVENTION;  Surgeon: Kayla Part, MD;  Location: [facility] INVASIVE CV LAB;  Service: Cardiovascular;  Laterality: Left;  left AVF   REMOVAL OF A DIALYSIS CATHETER N/A 12/03/2021   Procedure: MINOR REMOVAL OF A TUNNELED DIALYSIS CATHETER;  Surgeon: Mayo Speck, MD;  Location: [facility];  Service: Vascular;  Laterality: N/A;   REMOVAL OF A DIALYSIS CATHETER N/A 11/18/2022   Procedure: MINOR REMOVAL OF A TUNNELED DIALYSIS CATHETER;  Surgeon: Mayo Speck, MD;  Location: [facility];  Service: Vascular;  Laterality: N/A;   THROMBECTOMY W/ EMBOLECTOMY Right 12/10/2017   Procedure: THROMBECTOMY REVISION RIGHT ARM  ARTERIOVENOUS FISTULA;  Surgeon: Dannis Dy, MD;  Location: [facility] OR;  Service: Vascular;  Laterality: Right;   TRANSURETHRAL RESECTION OF PROSTATE N/A 01/04/2014   Procedure: TRANSURETHRAL RESECTION OF THE PROSTATE (TURP) (procedure #2);  Surgeon: Mohammad I Javaid, MD;  Location: [facility];  Service: Urology;  Laterality: N/A;    Family History  Problem Relation Age of Onset   Cancer Mother    Colon cancer Neg Hx     Social History:  reports that he has never smoked. He has never used smokeless tobacco. He reports that he does not drink alcohol and does not use drugs.  Allergies: No Known Allergies  Medications: I have reviewed the patient's current medications.   Results for orders placed or performed during the hospital encounter of 02/16/24 (from the past 48 hours)  CBC with Differential     Status: Abnormal   Collection Time: 02/16/24  7:56 AM  Result Value Ref Range   WBC 4.3  4.0 - 10.5 K/uL   RBC 2.67 (L) 4.22 - 5.81 MIL/uL   Hemoglobin 9.3 (L) 13.0 - 17.0 g/dL   HCT 16.1 (L) 09.6 - 04.5 %   MCV 104.5 (H) 80.0 - 100.0 fL   MCH 34.8 (H) 26.0 - 34.0 pg   MCHC 33.3 30.0 - 36.0 g/dL   RDW 40.9 81.1 - 91.4 %   Platelets 81 (L) 150 - 400 K/uL    Comment: SPECIMEN CHECKED FOR CLOTS Immature Platelet Fraction may be clinically indicated, consider ordering this additional test [id number] REPEATED TO VERIFY    nRBC 0.0 0.0 - 0.2 %   Neutrophils Relative % 59 %   Neutro Abs 2.6 1.7 - 7.7 K/uL   Lymphocytes Relative 26 %   Lymphs Abs 1.1 0.7 - 4.0 K/uL   Monocytes Relative 11 %   Monocytes Absolute  0.5 0.1 - 1.0 K/uL   Eosinophils Relative 2 %   Eosinophils Absolute 0.1 0.0 - 0.5 K/uL   Basophils Relative 1 %   Basophils Absolute 0.0 0.0 - 0.1 K/uL   WBC Morphology MORPHOLOGY UNREMARKABLE    RBC Morphology MORPHOLOGY UNREMARKABLE    Smear Review PLATELETS APPEAR DECREASED     Comment: PLATELET COUNT CONFIRMED BY SMEAR   Immature Granulocytes 1 %   Abs Immature Granulocytes 0.02 0.00 - 0.07 K/uL    Comment: Performed at [facility], [address], [place], [place] [address]  Comprehensive metabolic panel     Status: Abnormal   Collection Time: 02/16/24  7:56 AM  Result Value Ref Range   Sodium 134 (L) 135 - 145 mmol/L   Potassium 5.8 (H) 3.5 - 5.1 mmol/L   Chloride 103 98 - 111 mmol/L   CO2 18 (L) 22 - 32 mmol/L   Glucose, Bld 138 (H) 70 - 99 mg/dL    Comment: Glucose reference range applies only to samples taken after fasting for at least 8 hours.   BUN 80 (H) 8 - 23 mg/dL   Creatinine, Ser 60.45 (H) 0.61 - 1.24 mg/dL   Calcium  9.3 8.9 - 10.3 mg/dL   Total Protein 7.3 6.5 - 8.1 g/dL   Albumin  3.4 (L) 3.5 - 5.0 g/dL   AST 21 15 - 41 U/L   ALT 25 0 - 44 U/L   Alkaline Phosphatase 53 38 - 126 U/L   Total Bilirubin 0.6 0.0 - 1.2 mg/dL   GFR, Estimated 2 (L) >60 mL/min    Comment: (NOTE) Calculated using the CKD-EPI Creatinine Equation (2021)     Anion gap 13 5 - 15    Comment: Performed at [facility], [address] [place], [place] [address]  Hepatitis B surface antigen     Status: None   Collection Time: 02/16/24 10:00 PM  Result Value Ref Range   Hepatitis B Surface Ag NON REACTIVE NON REACTIVE    Comment: Performed at [facility], [address], [place], [place] [address]  Basic metabolic panel     Status: Abnormal   Collection Time: 02/17/24  2:43 AM  Result Value Ref Range   Sodium 136 135 - 145 mmol/L   Potassium 3.7 3.5 - 5.1 mmol/L   Chloride 96 (L) 98 - 111 mmol/L   CO2 23 22 - 32 mmol/L   Glucose, Bld 110 (H) 70 - 99 mg/dL    Comment: Glucose reference range applies only to samples taken after fasting for at least 8 hours.   BUN 38 (H) 8 - 23 mg/dL   Creatinine, Ser 82.95 (H) 0.61 - 1.24 mg/dL   Calcium  8.9 8.9 - 10.3 mg/dL   GFR, Estimated 5 (L) >60 mL/min    Comment: (NOTE) Calculated using the CKD-EPI Creatinine Equation (2021)    Anion gap 17 (H) 5 - 15    Comment: Performed at [facility], [address], [place], [place] [address]  Glucose, capillary     Status: None   Collection Time: 02/17/24 11:01 AM  Result Value Ref Range   Glucose-Capillary 78 70 - 99 mg/dL    Comment: Glucose reference range applies only to samples taken after fasting for at least 8 hours.    IR AV DIALY SHUNT INTRO NEEDLE/INTRAC INITIAL W/PTA/STENT/IMG LEFT Result Date: 02/17/2024 INDICATION: 78 year old male with thrombosed left upper extremity dialysis circuit, presents for attempt at mechanical thrombectomy and possible catheter placement  EXAM: ULTRASOUND-GUIDED ACCESS LEFT UPPER EXTREMITY DIALYSIS GRAFT X2 FISTULAGRAM MECHANICAL AND PHARMACOLOGIC THROMBECTOMY BALLOON ANGIOPLASTY AND STENTING OUTFLOW STENOSIS MEDICATIONS: 2 g Ancef  IV were administered, within 1 hour of the procedure and stent placement. ANESTHESIA/SEDATION: Moderate (conscious) sedation was employed during this procedure. A total of Versed  1.5 mg  and Fentanyl  100 mcg was administered intravenously by the radiology nurse. Total intra-service moderate Sedation Time: 60 minutes. [name] level of consciousness and vital signs were monitored continuously by radiology nursing throughout the procedure under my direct supervision. FLUOROSCOPY: Radiation Exposure Index (as provided by the fluoroscopic device): 46 mGy Kerma COMPLICATIONS: None PROCEDURE: Informed written consent was obtained from the patient after a thorough discussion of the procedural risks, benefits and alternatives. All questions were addressed. Maximal Sterile Barrier Technique was utilized including caps, mask, sterile gowns, sterile gloves, sterile drape, hand hygiene and skin antiseptic. A timeout was performed prior to the initiation of the procedure. The left upper extremity was prepped and draped in the usual sterile fashion. Ultrasound survey was performed with images stored and sent to PACs. 1% lidocaine  was used for local anesthesia. Ultrasound guidance was used to access the upper extremity graft directed centrally with a micropuncture kit. Exchange was made for a 7 [place] short sheath. A Glidewire was then used to navigate a Kumpe the catheter through the venous outflow. Once the catheter was central, venogram was performed of the central vasculature confirming patency. Pull-back venogram identified the location of the thrombus in the axillary vein. Upon withdrawal of the angled catheter, a solution of 2 milligrams of tPA in saline was infused into the outflow portion of the graft. Catheter was then against advanced centrally with the Glidewire. Once the catheter was central the Glidewire was removed and a Bentson wire was placed. A 6 mm x 40 mm balloon catheter was used to macerate the thrombosed segment. Repeat venogram demonstrated restoration of flow through the venous outflow, with some residual chronic thrombus in the stented segment, and a residual stenosis of the axillary  vein just central to the prior stent. In order to fully evaluate the proximal aspect of the circuit, as well as potentially use a Fogarty balloon for residual thrombus, we elected to access directed peripherally. 1% lidocaine  was used for local anesthesia. Ultrasound guidance was used to access the upper extremity directed peripherally with a micropuncture set. Wire was advanced through the needle into the proximal circuit. The needle was removed in the 3 French sheath was advanced. Stiff Glidewire was then advanced into into the brachial artery. A 6 French sheath was then placed. An over the wire Fogarty balloon advanced into the artery. The arterial plug was pulled. Flow is present, though with some residual chronic thrombus in the stented segment. We elected to treat the irregular segment of the outflow with covered stenting. In 8 [place] sheath was then placed directed centrally. Overlapping Co vera straight stents were then placed in the irregular segment. More peripherally 8 mm x 60 mm was placed. More centrally a 7 mm x 60 mm was placed. Upon placement of this more central 7 mm covered stent, the peripherally directed access was removed given the stent covered the access site. Post deployment angioplasty was then performed with 8 mm x 60 mm high-pressure balloon angioplasty. Of note we attempted an 8 mm x 60 mm balloon angioplasty of a short segment narrowing in the axillary vein, which did not respond with a waist, and is clearly a segment of redundancy given the patient's  positioning. This is favored to represent a pseudo stenosis on the venogram and does not require further treatment at this time. Reflux images were performed of the arterial anastomosis by inflating the outflow in the covered stent segment. Catheters and wires were removed after the completed fistulagram. No residual stenosis was identified in the venous outflow. No residual stenosis at the arterial anastomosis. Excellent thrill was  confirmed. The short sheaths were removed after placement of hemostasis sutures. Patient tolerated the procedure well and remained hemodynamically stable throughout. No complications were encountered and no significant blood loss was encountered. FINDINGS: Thrombosed graft on initial evaluation. 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 The ultrasound demonstrates that the prior stent is poorly approximated to the venous wall and is likely under sized for this vein segment. High-grade 80% or greater stenosis of the venous outflow just proximal to the previously stented segment is the most likely etiology for the thrombosis of the graft. After treatment with mechanical and pharmacologic thrombectomy, flow was restored. Stenting of the irregular remote graft and the stenotic segment was successful in restoring excellent flow, with 0% residual stenosis. No stenosis at the arterial origin. 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 This cannot be treated with mechanical thrombectomy at the current time. IMPRESSION: Status post ultrasound guided access left upper extremity dialysis circuit for mechanical/pharmacologic thrombectomy and treatment of venous outflow stenosis with balloon angioplasty and stenting restoring flow and excellent thrill. Signed, Marciano Settles. Rexine Cater, RPVI [facility] Radiology ACCESS: This access remains amenable to future percutaneous interventions as clinically indicated. Electronically Signed   By: Myrlene Asper D.O.   On: 02/17/2024 09:07   DG Chest 2 View Result Date: 02/17/2024 CLINICAL DATA:  Shortness of breath EXAM: CHEST - 2 VIEW COMPARISON:  10/01/2022 FINDINGS: Normal heart size and stable mediastinal contours. No acute infiltrate or edema. No effusion or pneumothorax. No acute osseous findings. IMPRESSION: No active cardiopulmonary disease.  Electronically Signed   By: Ronnette Coke M.D.   On: 02/17/2024 05:04    ROS: As per H&P, rest of the systems reviewed and negative. Blood pressure 104/64, pulse 70, temperature 99.3 F (37.4 C), temperature source Oral, resp. rate 15, height 5\' 6"  (1.676 m), weight 95.4 kg, SpO2 100%. Gen: NAD, comfortable Respiratory: Clear bilateral, no wheezing or crackle Cardiovascular: Regular rate rhythm S1-S2 normal, no rubs GI: Abdomen soft, nontender, nondistended Extremities, no cyanosis or clubbing, no edema Skin: No rash or ulcer Neurology: Alert, awake, following commands, oriented Dialysis Access: Left upper extremity AV fistula has good thrill and bruit.  Assessment/Plan:  # Clotted AV fistula: Status post thrombectomy and angioplasty by IR on 4/22 and received dialysis successfully.  # ESRD: TTS at [facility].  Completed dialysis this morning.  Plan for next dialysis tomorrow which can be done as outpatient if patient is discharged.  # Hypertension: Concerned about hypotension after dialysis.  Currently asymptomatic and BP has improved.  No evidence of infection.  # Anemia of ESRD: Continue ESA as outpatient.  # Metabolic Bone Disease: Monitor calcium  and phosphorus.  Resume home medications.  Ok to discharge from renal perspective.  Communicated with the primary team.  Clementine Cutting 02/17/2024, 1:35 PM

## 2024-02-17 NOTE — ED Notes (Signed)
Patient resting with eyes closed. Respirations even and unlabored.

## 2024-02-17 NOTE — Progress Notes (Signed)
 This encounter was created in error - please disregard.

## 2024-02-17 NOTE — ED Notes (Signed)
 Pt returned from dialysis.  They spent 3 hrs and took 1.3 lt off when the goal was 2.3 due to low blood pressure.  Current pb is 99/63.

## 2024-02-17 NOTE — Plan of Care (Signed)

## 2024-02-18 DIAGNOSIS — Z7401 Bed confinement status: Secondary | ICD-10-CM | POA: Diagnosis not present

## 2024-02-18 DIAGNOSIS — T82868A Thrombosis of vascular prosthetic devices, implants and grafts, initial encounter: Secondary | ICD-10-CM | POA: Diagnosis not present

## 2024-02-18 DIAGNOSIS — R531 Weakness: Secondary | ICD-10-CM | POA: Diagnosis not present

## 2024-02-18 DIAGNOSIS — T8249XA Other complication of vascular dialysis catheter, initial encounter: Secondary | ICD-10-CM | POA: Diagnosis not present

## 2024-02-18 LAB — COMPREHENSIVE METABOLIC PANEL WITH GFR
ALT: 12 U/L (ref 0–44)
AST: 23 U/L (ref 15–41)
Albumin: 3.1 g/dL — ABNORMAL LOW (ref 3.5–5.0)
Alkaline Phosphatase: 44 U/L (ref 38–126)
Anion gap: 16 — ABNORMAL HIGH (ref 5–15)
BUN: 52 mg/dL — ABNORMAL HIGH (ref 8–23)
CO2: 21 mmol/L — ABNORMAL LOW (ref 22–32)
Calcium: 8.7 mg/dL — ABNORMAL LOW (ref 8.9–10.3)
Chloride: 100 mmol/L (ref 98–111)
Creatinine, Ser: 13.86 mg/dL — ABNORMAL HIGH (ref 0.61–1.24)
GFR, Estimated: 3 mL/min — ABNORMAL LOW (ref >60)
Glucose, Bld: 133 mg/dL — ABNORMAL HIGH (ref 70–99)
Potassium: 4.7 mmol/L (ref 3.5–5.1)
Sodium: 137 mmol/L (ref 135–145)
Total Bilirubin: 0.7 mg/dL (ref 0.0–1.2)
Total Protein: 6.6 g/dL (ref 6.5–8.1)

## 2024-02-18 LAB — CBC
HCT: 25.5 % — ABNORMAL LOW (ref 39.0–52.0)
Hemoglobin: 8.7 g/dL — ABNORMAL LOW (ref 13.0–17.0)
MCH: 34.9 pg — ABNORMAL HIGH (ref 26.0–34.0)
MCHC: 34.1 g/dL (ref 30.0–36.0)
MCV: 102.4 fL — ABNORMAL HIGH (ref 80.0–100.0)
Platelets: 78 10*3/uL — ABNORMAL LOW (ref 150–400)
RBC: 2.49 MIL/uL — ABNORMAL LOW (ref 4.22–5.81)
RDW: 13.3 % (ref 11.5–15.5)
WBC: 3.9 10*3/uL — ABNORMAL LOW (ref 4.0–10.5)
nRBC: 0 % (ref 0.0–0.2)

## 2024-02-18 LAB — MRSA NEXT GEN BY PCR, NASAL: MRSA by PCR Next Gen: NOT DETECTED

## 2024-02-18 LAB — GLUCOSE, CAPILLARY
Glucose-Capillary: 126 mg/dL — ABNORMAL HIGH (ref 70–99)
Glucose-Capillary: 85 mg/dL (ref 70–99)
Glucose-Capillary: 79 mg/dL (ref 70–99)

## 2024-02-18 LAB — PHOSPHORUS: Phosphorus: 6.9 mg/dL — ABNORMAL HIGH (ref 2.5–4.6)

## 2024-02-18 LAB — MAGNESIUM: Magnesium: 2.4 mg/dL (ref 1.7–2.4)

## 2024-02-18 NOTE — TOC Transition Note (Signed)
 Transition of Care [facility]) - Discharge Note   Patient Details  Name: Zachary [name] MRN: [id number] Date of Birth: [birth year]  [facility] CM/SW Contact:  Jeffory Mings, [place] Phone Number: 02/18/2024, 1:04 PM   Clinical Narrative: Pt for dc back to [facility] where he is a LTC resident. Spoke to Fidelis in admissions who confirmed pt is able to return to room 112. Notified pt's guardian Chance Vinnie Greet [facility] DSS [phone]. RN provided with number for report and PTAR arranged for transport. SW signing off at dc.   Paullette Boston, MSW, LCSW [phone] (coverage)      Final next level of care: Skilled Nursing Facility Barriers to Discharge: Barriers Resolved   Patient Goals and CMS Choice            Discharge Placement              Patient chooses bed at: [facility] Patient to be transferred to facility by: PTAR Name of family member notified: Chance [facility] DSS Guardian Patient and family notified of of transfer: 02/18/24  Discharge Plan and Services Additional resources added to the After Visit Summary for                                       Social Drivers of Health (SDOH) Interventions SDOH Screenings   Food Insecurity: No Food Insecurity (02/17/2024)  Housing: Low Risk  (02/17/2024)  Transportation Needs: No Transportation Needs (02/17/2024)  Utilities: Not At Risk (02/17/2024)  Depression (PHQ2-9): Low Risk  (12/09/2023)  Financial Resource Strain: Low Risk  (02/18/2018)  Physical Activity: Unknown (09/15/2019)  Social Connections: Socially Isolated (02/17/2024)  Stress: No Stress Concern Present (02/18/2018)  Tobacco Use: Low Risk  (02/16/2024)     Readmission Risk Interventions     No data to display

## 2024-02-18 NOTE — Progress Notes (Signed)
 D/C noted. Contacted [facility] to advise staff of pt's d/c today and that pt should resume care on Saturday. D/C summary and renal notes faxed to clinic for continuation of care.   Lauraine Polite Renal Navigator [phone]

## 2024-02-18 NOTE — Progress Notes (Signed)
 Attempted to call [facility] to give report but no answer.

## 2024-02-18 NOTE — Plan of Care (Signed)
  Problem: Education: Goal: Ability to describe self-care measures that may prevent or decrease complications (Diabetes Survival Skills Education) will improve Outcome: Adequate for Discharge Goal: Individualized Educational Video(s) Outcome: Adequate for Discharge   Problem: Coping: Goal: Ability to adjust to condition or change in health will improve Outcome: Adequate for Discharge   Problem: Fluid Volume: Goal: Ability to maintain a balanced intake and output will improve Outcome: Adequate for Discharge   Problem: Health Behavior/Discharge Planning: Goal: Ability to identify and utilize available resources and services will improve Outcome: Adequate for Discharge Goal: Ability to manage health-related needs will improve Outcome: Adequate for Discharge   Problem: Metabolic: Goal: Ability to maintain appropriate glucose levels will improve Outcome: Adequate for Discharge   Problem: Nutritional: Goal: Maintenance of adequate nutrition will improve Outcome: Adequate for Discharge Goal: Progress toward achieving an optimal weight will improve Outcome: Adequate for Discharge   Problem: Skin Integrity: Goal: Risk for impaired skin integrity will decrease Outcome: Adequate for Discharge   Problem: Tissue Perfusion: Goal: Adequacy of tissue perfusion will improve Outcome: Adequate for Discharge   Problem: Education: Goal: Knowledge of General Education information will improve Description: Including pain rating scale, medication(s)/side effects and non-pharmacologic comfort measures Outcome: Adequate for Discharge   Problem: Health Behavior/Discharge Planning: Goal: Ability to manage health-related needs will improve Outcome: Adequate for Discharge   Problem: Clinical Measurements: Goal: Ability to maintain clinical measurements within normal limits will improve Outcome: Adequate for Discharge Goal: Will remain free from infection Outcome: Adequate for Discharge Goal:  Diagnostic test results will improve Outcome: Adequate for Discharge Goal: Respiratory complications will improve Outcome: Adequate for Discharge Goal: Cardiovascular complication will be avoided Outcome: Adequate for Discharge   Problem: Activity: Goal: Risk for activity intolerance will decrease Outcome: Adequate for Discharge   Problem: Nutrition: Goal: Adequate nutrition will be maintained Outcome: Adequate for Discharge   Problem: Coping: Goal: Level of anxiety will decrease Outcome: Adequate for Discharge   Problem: Elimination: Goal: Will not experience complications related to bowel motility Outcome: Adequate for Discharge Goal: Will not experience complications related to urinary retention Outcome: Adequate for Discharge   Problem: Pain Managment: Goal: General experience of comfort will improve and/or be controlled Outcome: Adequate for Discharge   Problem: Safety: Goal: Ability to remain free from injury will improve Outcome: Adequate for Discharge   Problem: Skin Integrity: Goal: Risk for impaired skin integrity will decrease Outcome: Adequate for Discharge   Problem: Education: Goal: Individualized Educational Video(s) Outcome: Adequate for Discharge   Problem: Fluid Volume: Goal: Compliance with measures to maintain balanced fluid volume will improve Outcome: Adequate for Discharge   Problem: Health Behavior/Discharge Planning: Goal: Ability to manage health-related needs will improve Outcome: Adequate for Discharge   Problem: Nutritional: Goal: Ability to make healthy dietary choices will improve Outcome: Adequate for Discharge   Problem: Clinical Measurements: Goal: Complications related to the disease process, condition or treatment will be avoided or minimized Outcome: Adequate for Discharge

## 2024-02-18 NOTE — Care Management Obs Status (Signed)
 MEDICARE OBSERVATION STATUS NOTIFICATION   Patient Details  Name: Zachary [name] MRN: [id number] Date of Birth: 11-Dec-1945   Medicare Observation Status Notification Given:  Yes    Tom-Johnson, Angelique Ken, RN 02/18/2024, 11:48 AM

## 2024-02-18 NOTE — Discharge Summary (Signed)
 Physician Discharge Summary   Patient: Zachary [name] MRN: [id number] DOB: 1946/04/23  Admit date:     02/16/2024  Discharge date: 02/18/24  Discharge Physician: Zachary [name]    PCP: Zachary Shu, NP   Discharge Diagnoses: Principal Problem:   Clotted dialysis access, initial encounter [name])  Resolved Problems:   * No resolved hospital problems. *  Hospital Course: [name] who presented with a dialysis access problem. Pt was seen by Zachary [name] IR staff on 02/16/2024 who performed US  guided access LUE HD circuit x 2, Fistulagram, Mechanical and pharmacologic thrombectomy. IR was able to restore the pt's access (excellent flow and thrill).  Pt did undergo dialysis early AM on 02/17/2024 and 8 AM on 02/18/2024. However, after dialysis he was noted to be hypotensive and tachycardic requiring small IV fluid bolus and midodrine . He was continued on his midodrine  and kept in the hospital for BP monitoring.  On 02/18/2024 pt was cleared for discharge back to his facility.   DISCHARGE MEDICATION: Allergies as of 02/18/2024   No Known Allergies      Medication List     TAKE these medications    aspirin  81 MG chewable tablet Chew 81 mg by mouth daily.   cyanocobalamin  1000 MCG tablet Commonly known as: VITAMIN B12 Take 1,000 mcg by mouth at bedtime.   levothyroxine  150 MCG tablet Commonly known as: SYNTHROID  Take 150 mcg by mouth daily before breakfast.   midodrine  10 MG tablet Commonly known as: PROAMATINE  Take 10 mg by mouth See admin instructions. Send medication with resident on Tues/Thurs/Sat to dialysis. Do not administer at [facility], give to resident to take with him to dialysis. Send #3 tablets of 10mg  and #3 tablets of 5mg  to equal 15mg  per dose.   midodrine  5 MG tablet Commonly known as: PROAMATINE  Take 5 mg by mouth See admin instructions. Send medication with resident on Tues/Thurs/Sat to dialysis. Do not administer at [facility], give to resident to take with him to  dialysis. Send #3 tablets of 5mg  and #3 tablets of 10mg  to equal 15mg  per dose.   OMEGA-3 FISH OIL PO Take 2 capsules by mouth at bedtime. Omega 3 - DHA - EPA - Fish oil 300-1000mg  capsules   rosuvastatin  20 MG tablet Commonly known as: CRESTOR  Take 20 mg by mouth at bedtime.   Semglee  (yfgn) 100 UNIT/ML Pen Generic drug: insulin  glargine-yfgn Inject 8 Units into the skin at bedtime.   sevelamer  carbonate 800 MG tablet Commonly known as: RENVELA  Take 3 tablets (2,400 mg total) by mouth 3 (three) times daily with meals.   tamsulosin  0.4 MG Caps capsule Commonly known as: FLOMAX  Take 0.4 mg by mouth at bedtime. Give 30 minutes after a meal. Do not crush or chew   torsemide  20 MG tablet Commonly known as: DEMADEX  Take 20 mg by mouth See admin instructions. Give 1 tablet (20mg ) by mouth once a day in the afternoon on Sunday, Monday, Wednesday, Friday.        Discharge Exam: Filed Weights   02/17/24 1059 02/18/24 0436 02/18/24 0755  Weight: 95.4 kg 92.7 kg 93.4 kg   Physical Exam HENT:     Head: Normocephalic.     Mouth/Throat:     Mouth: Mucous membranes are moist.  Cardiovascular:     Rate and Rhythm: Normal rate and regular rhythm.  Pulmonary:     Effort: Pulmonary effort is normal.  Abdominal:     Palpations: Abdomen is soft.  Musculoskeletal:  General: Normal range of motion.     Cervical back: Neck supple.  Skin:    General: Skin is warm.  Neurological:     Mental Status: He is alert. Mental status is at baseline.  Psychiatric:        Mood and Affect: Mood normal.      Condition at discharge: fair  The results of significant diagnostics from this hospitalization (including imaging, microbiology, ancillary and laboratory) are listed below for reference.   Imaging Studies: IR AV DIALY SHUNT INTRO NEEDLE/INTRAC INITIAL W/PTA/STENT/IMG LEFT Result Date: 02/17/2024 INDICATION: 78 year old male with thrombosed left upper extremity dialysis circuit,  presents for attempt at mechanical thrombectomy and possible catheter placement EXAM: ULTRASOUND-GUIDED ACCESS LEFT UPPER EXTREMITY DIALYSIS GRAFT X2 FISTULAGRAM MECHANICAL AND PHARMACOLOGIC THROMBECTOMY BALLOON ANGIOPLASTY AND STENTING OUTFLOW STENOSIS MEDICATIONS: 2 g Ancef  IV were administered, within 1 hour of the procedure and stent placement. ANESTHESIA/SEDATION: Moderate (conscious) sedation was employed during this procedure. A total of Versed  1.5 mg and Fentanyl  100 mcg was administered intravenously by the radiology nurse. Total intra-service moderate Sedation Time: 60 minutes. [name] level of consciousness and vital signs were monitored continuously by radiology nursing throughout the procedure under my direct supervision. FLUOROSCOPY: Radiation Exposure Index (as provided by the fluoroscopic device): 46 mGy Kerma COMPLICATIONS: None PROCEDURE: Informed written consent was obtained from the patient after a thorough discussion of the procedural risks, benefits and alternatives. All questions were addressed. Maximal Sterile Barrier Technique was utilized including caps, mask, sterile gowns, sterile gloves, sterile drape, hand hygiene and skin antiseptic. A timeout was performed prior to the initiation of the procedure. The left upper extremity was prepped and draped in the usual sterile fashion. Ultrasound survey was performed with images stored and sent to PACs. 1% lidocaine  was used for local anesthesia. Ultrasound guidance was used to access the upper extremity graft directed centrally with a micropuncture kit. Exchange was made for a 7 [place] short sheath. A Glidewire was then used to navigate a Kumpe the catheter through the venous outflow. Once the catheter was central, venogram was performed of the central vasculature confirming patency. Pull-back venogram identified the location of the thrombus in the axillary vein. Upon withdrawal of the angled catheter, a solution of 2 milligrams of tPA in  saline was infused into the outflow portion of the graft. Catheter was then against advanced centrally with the Glidewire. Once the catheter was central the Glidewire was removed and a Bentson wire was placed. A 6 mm x 40 mm balloon catheter was used to macerate the thrombosed segment. Repeat venogram demonstrated restoration of flow through the venous outflow, with some residual chronic thrombus in the stented segment, and a residual stenosis of the axillary vein just central to the prior stent. In order to fully evaluate the proximal aspect of the circuit, as well as potentially use a Fogarty balloon for residual thrombus, we elected to access directed peripherally. 1% lidocaine  was used for local anesthesia. Ultrasound guidance was used to access the upper extremity directed peripherally with a micropuncture set. Wire was advanced through the needle into the proximal circuit. The needle was removed in the 3 French sheath was advanced. Stiff Glidewire was then advanced into into the brachial artery. A 6 French sheath was then placed. An over the wire Fogarty balloon advanced into the artery. The arterial plug was pulled. Flow is present, though with some residual chronic thrombus in the stented segment. We elected to treat the irregular segment of the outflow with covered  stenting. In 8 [place] sheath was then placed directed centrally. Overlapping Co vera straight stents were then placed in the irregular segment. More peripherally 8 mm x 60 mm was placed. More centrally a 7 mm x 60 mm was placed. Upon placement of this more central 7 mm covered stent, the peripherally directed access was removed given the stent covered the access site. Post deployment angioplasty was then performed with 8 mm x 60 mm high-pressure balloon angioplasty. Of note we attempted an 8 mm x 60 mm balloon angioplasty of a short segment narrowing in the axillary vein, which did not respond with a waist, and is clearly a segment of redundancy  given the patient's positioning. This is favored to represent a pseudo stenosis on the venogram and does not require further treatment at this time. Reflux images were performed of the arterial anastomosis by inflating the outflow in the covered stent segment. Catheters and wires were removed after the completed fistulagram. No residual stenosis was identified in the venous outflow. No residual stenosis at the arterial anastomosis. Excellent thrill was confirmed. The short sheaths were removed after placement of hemostasis sutures. Patient tolerated the procedure well and remained hemodynamically stable throughout. No complications were encountered and no significant blood loss was encountered. FINDINGS: Thrombosed graft on initial evaluation. 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 The ultrasound demonstrates that the prior stent is poorly approximated to the venous wall and is likely under sized for this vein segment. High-grade 80% or greater stenosis of the venous outflow just proximal to the previously stented segment is the most likely etiology for the thrombosis of the graft. After treatment with mechanical and pharmacologic thrombectomy, flow was restored. Stenting of the irregular remote graft and the stenotic segment was successful in restoring excellent flow, with 0% residual stenosis. No stenosis at the arterial origin. 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 This cannot be treated with mechanical thrombectomy at the current time. IMPRESSION: Status post ultrasound guided access left upper extremity dialysis circuit for mechanical/pharmacologic thrombectomy and treatment of venous outflow stenosis with balloon angioplasty and stenting restoring flow and excellent thrill. Signed, Marciano Settles. Rexine Cater, RPVI [facility] Radiology ACCESS: This access remains amenable to  future percutaneous interventions as clinically indicated. Electronically Signed   By: Myrlene Asper D.O.   On: 02/17/2024 09:07   DG Chest 2 View Result Date: 02/17/2024 CLINICAL DATA:  Shortness of breath EXAM: CHEST - 2 VIEW COMPARISON:  10/01/2022 FINDINGS: Normal heart size and stable mediastinal contours. No acute infiltrate or edema. No effusion or pneumothorax. No acute osseous findings. IMPRESSION: No active cardiopulmonary disease. Electronically Signed   By: Ronnette Coke M.D.   On: 02/17/2024 05:04    Microbiology: Results for orders placed or performed during the hospital encounter of 02/16/24  MRSA Next Gen by PCR, Nasal     Status: None   Collection Time: 02/18/24  6:01 AM   Specimen: Nasal Mucosa; Nasal Swab  Result Value Ref Range Status   MRSA by PCR Next Gen NOT DETECTED NOT DETECTED Final    Comment: (NOTE) The GeneXpert MRSA Assay (FDA approved for NASAL specimens only), is one component of a comprehensive MRSA colonization surveillance program. It is not intended to diagnose MRSA infection nor to guide or monitor treatment for MRSA infections. Test performance is not FDA approved in patients less than 72 years old. Performed at [facility], [address], [place], [place] [address]     Labs: CBC: Recent Labs  Lab 02/16/24 0756 02/18/24 0518  WBC 4.3 3.9*  NEUTROABS 2.6  --   HGB 9.3* 8.7*  HCT 27.9* 25.5*  MCV 104.5* 102.4*  PLT 81* 78*   Basic Metabolic Panel: Recent Labs  Lab 02/16/24 0756 02/17/24 0243 02/18/24 0518  NA 134* 136 137  K 5.8* 3.7 4.7  CL 103 96* 100  CO2 18* 23 21*  GLUCOSE 138* 110* 133*  BUN 80* 38* 52*  CREATININE 17.85* 10.37* 13.86*  CALCIUM  9.3 8.9 8.7*  MG  --   --  2.4  PHOS  --   --  6.9*   Liver Function Tests: Recent Labs  Lab 02/16/24 0756 02/18/24 0518  AST 21 23  ALT 25 12  ALKPHOS 53 44  BILITOT 0.6 0.7  PROT 7.3 6.6  ALBUMIN  3.4* 3.1*   CBG: Recent Labs  Lab 02/17/24 1101  02/17/24 1626 02/17/24 2204 02/18/24 0721  GLUCAP 78 103* 110* 126*    Discharge time spent: greater than 30 minutes.  Signed: Zayvian Mcmurtry , MD [facility] 02/18/2024

## 2024-02-18 NOTE — Procedures (Signed)
 Patient was seen on dialysis and the procedure was supervised.  BFR 400  Via AVF BP is  136/87. AVF working well.  Patient appears to be tolerating treatment well.  Zachary [name] 02/18/2024

## 2024-02-19 ENCOUNTER — Non-Acute Institutional Stay (SKILLED_NURSING_FACILITY): Payer: Self-pay | Admitting: Adult Health

## 2024-02-19 ENCOUNTER — Encounter: Payer: Self-pay | Admitting: Adult Health

## 2024-02-19 DIAGNOSIS — I700 Atherosclerosis of aorta: Secondary | ICD-10-CM | POA: Diagnosis not present

## 2024-02-19 DIAGNOSIS — N186 End stage renal disease: Secondary | ICD-10-CM | POA: Diagnosis not present

## 2024-02-19 DIAGNOSIS — I120 Hypertensive chronic kidney disease with stage 5 chronic kidney disease or end stage renal disease: Secondary | ICD-10-CM

## 2024-02-19 DIAGNOSIS — E11319 Type 2 diabetes mellitus with unspecified diabetic retinopathy without macular edema: Secondary | ICD-10-CM

## 2024-02-19 DIAGNOSIS — Z992 Dependence on renal dialysis: Secondary | ICD-10-CM

## 2024-02-19 DIAGNOSIS — E1122 Type 2 diabetes mellitus with diabetic chronic kidney disease: Secondary | ICD-10-CM | POA: Diagnosis not present

## 2024-02-19 DIAGNOSIS — D696 Thrombocytopenia, unspecified: Secondary | ICD-10-CM

## 2024-02-19 DIAGNOSIS — E1169 Type 2 diabetes mellitus with other specified complication: Secondary | ICD-10-CM | POA: Diagnosis not present

## 2024-02-19 DIAGNOSIS — Z794 Long term (current) use of insulin: Secondary | ICD-10-CM

## 2024-02-19 DIAGNOSIS — D631 Anemia in chronic kidney disease: Secondary | ICD-10-CM

## 2024-02-19 DIAGNOSIS — E785 Hyperlipidemia, unspecified: Secondary | ICD-10-CM

## 2024-02-19 DIAGNOSIS — E034 Atrophy of thyroid (acquired): Secondary | ICD-10-CM | POA: Diagnosis not present

## 2024-02-19 LAB — HEPATITIS B SURFACE ANTIBODY, QUANTITATIVE: Hep B S AB Quant (Post): 280 m[IU]/mL

## 2024-02-19 NOTE — Progress Notes (Signed)
 Location:  [facility] Room Number: 112 Place of Service:  SNF (31)   CODE STATUS: full  No Known Allergies  Chief Complaint  Patient presents with   Hospitalization Follow-up    HPI:  He is a 78 year old long term resident of this facility who has been hospitalized from 02-16-24 through 02-18-24. His medical history ESRD on dialysis; diabetes type 2. He presented to the ED with dialysis access problem. On 02-16-24 he had a US  guided access LUE HD circuit X2 fistula gram, mechanical and pharmacologic thrombectomy. His access was restored. He was dialyzed had hypotension and tachycardia which required a fluid bolus. He was kept in the hospital for blood pressure monitoring. 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  Major depression single episode moderate:   Past Medical History:  Diagnosis Date   Abnormal CT scan, kidney 10/06/2011   Acute pyelonephritis 10/07/2011   Anemia    normocytic   Anxiety    mental retardation   Bladder wall thickening 10/06/2011   BPH (benign prostatic hypertrophy)    Diabetes mellitus    Dialysis patient (HCC)    Tuesday, Thursday and Saturday,    DVT of leg (deep venous thrombosis) (HCC) 12/25/2016   Edema     history of lower extremity edema   GERD (gastroesophageal reflux disease)    Heme positive stool    Hydronephrosis    Hyperkalemia    Hyperlipidemia    Hypernatremia    Hypertension    Hypothyroidism    Impaired speech    Infected prosthetic vascular graft (HCC)    MR (mental retardation)    Muscle weakness    Obstructive uropathy    Perinephric abscess 10/07/2011   Poor historian poor historian   Primary colorectal adenocarcinoma (HCC) 04/2017   S/p total colectomy   Protein calorie malnutrition (HCC)    Pyelonephritis    Renal failure (ARF), acute on chronic (HCC)    Renal insufficiency     chronic history   Sepsis (HCC)    Smoking    Uremia    Urinary retention    UTI (lower urinary tract infection) 10/06/2011    Past Surgical History:  Procedure Laterality Date   A/V FISTULAGRAM N/A 08/13/2018   Procedure: A/V FISTULAGRAM - Right Upper;  Surgeon: Richrd Char, MD;  Location: [facility] INVASIVE CV LAB;  Service: Cardiovascular;  Laterality: N/A;   A/V FISTULAGRAM N/A 11/22/2018   Procedure: A/V FISTULAGRAM - Right Upper;  Surgeon: Adine Hoof, MD;  Location: [facility] INVASIVE CV LAB;  Service: Cardiovascular;  Laterality: N/A;   A/V FISTULAGRAM Left 10/08/2022   Procedure: A/V Fistulagram;  Surgeon: Kayla Part, MD;  Location: [facility] INVASIVE CV LAB;  Service: Cardiovascular;  Laterality: Left;   A/V FISTULAGRAM Left 05/06/2023   Procedure: A/V Fistulagram;  Surgeon: Carlene Che, MD;  Location: [facility] INVASIVE CV LAB;  Service: Cardiovascular;  Laterality: Left;   AV FISTULA PLACEMENT Left 07/06/2015   Procedure:  INSERTION LEFT ARM ARTERIOVENOUS GORTEX GRAFT;  Surgeon: Dannis Dy, MD;  Location: [facility] OR;  Service: Vascular;  Laterality: Left;   AV FISTULA PLACEMENT Right 02/26/2016   Procedure: ARTERIOVENOUS (AV) FISTULA CREATION ;  Surgeon: Dannis Dy, MD;  Location: [facility] OR;  Service: Vascular;  Laterality: Right;   AV FISTULA PLACEMENT Right 11/25/2018   Procedure: INSERTION OF ARTERIOVENOUS (AV) ARTEGRAFT RIGHT UPPER ARM;  Surgeon: Adine Hoof, MD;  Location: [facility] OR;  Service: Vascular;  Laterality: Right;   AV FISTULA PLACEMENT Left 05/28/2021   Procedure: LEFT ARM ARTERIOVENOUS (AV) FISTULA;  Surgeon: Mayo Speck, MD;  Location: [facility] ORS;  Service: Vascular;  Laterality: Left;   AVGG REMOVAL Left 10/09/2015   Procedure: REMOVAL OF ARTERIOVENOUS GORETEX GRAFT (AVGG) Evacuation of Lymphocele, Vein Patch angioplasty of brachial artery.;  Surgeon: Dannis Dy, MD;  Location: [facility] OR;  Service: Vascular;  Laterality: Left;    BASCILIC VEIN TRANSPOSITION Right 02/26/2016   Procedure: Right BASCILIC VEIN TRANSPOSITION;  Surgeon: Dannis Dy, MD;  Location: [facility] OR;  Service: Vascular;  Laterality: Right;   BASCILIC VEIN TRANSPOSITION Left 07/30/2021   Procedure: LEFT ARM SECOND STAGE BASILIC VEIN TRANSPOSITION;  Surgeon: Mayo Speck, MD;  Location: [facility] ORS;  Service: Vascular;  Laterality: Left;   CIRCUMCISION N/A 01/04/2014   Procedure: CIRCUMCISION ADULT (procedure #1);  Surgeon: Mohammad I Javaid, MD;  Location: [facility] ORS;  Service: Urology;  Laterality: N/A;   COLECTOMY N/A 05/04/2017   Procedure: TOTAL COLECTOMY;  Surgeon: Alanda Allegra, MD;  Location: [facility] ORS;  Service: General;  Laterality: N/A;   COLONOSCOPY N/A 04/27/2017   Surgeon: Suzette Espy, MD; annular mass in the ascending colon likely representing cancer biopsied, multiple 6-22 mm polyps removed, clean rectum.  Pathology with multiple tubular adenomas, high-grade dysplasia noted in ascending colon and splenic flexure.   CYSTOSCOPY W/ RETROGRADES Bilateral 06/29/2015   Procedure: CYSTOSCOPY, DILATION OF URETHRAL STRICTURE WITH BILATERAL RETROGRADE PYELOGRAM,SUPRAPUBIC TUBE CHANGE;  Surgeon: Christina Coyer, MD;  Location: [facility] ORS;  Service: Urology;  Laterality: Bilateral;   CYSTOSCOPY WITH URETHRAL DILATATION N/A 12/29/2013   Procedure: CYSTOSCOPY WITH URETHRAL DILATATION;  Surgeon: Reggie Caper, MD;  Location: [facility]S;  Service: Urology;  Laterality: N/A;   ESOPHAGOGASTRODUODENOSCOPY N/A 04/27/2017   Procedure: ESOPHAGOGASTRODUODENOSCOPY (EGD);  Surgeon: Suzette Espy, MD;  Location: [facility];  Service: Endoscopy;  Laterality: N/A;   FLEXIBLE SIGMOIDOSCOPY N/A 12/02/2021   Procedure: FLEXIBLE SIGMOIDOSCOPY;  Surgeon: Vinetta Greening, DO;  Location: [facility];  Service: Endoscopy;  Laterality: N/A;  10:30am, dialysis patient   INSERTION OF DIALYSIS CATHETER Right 11/25/2018   Procedure: INSERTION OF DIALYSIS CATHETER RIGHT INTERNAL  JUGULAR;  Surgeon: Adine Hoof, MD;  Location: [facility] OR;  Service: Vascular;  Laterality: Right;   IR AV DIALY SHUNT INTRO NEEDLE/INTRAC INITIAL W/PTA/STENT/IMG LT Left 02/16/2024   IR AV DIALY SHUNT INTRO NEEDLE/INTRACATH INITIAL W/PTA/IMG RIGHT Right 09/07/2018   IR FLUORO GUIDE CV LINE RIGHT  10/16/2020   IR FLUORO GUIDE CV LINE RIGHT  10/03/2022   IR REMOVAL TUN CV CATH W/O FL  01/12/2019   IR THROMBECTOMY AV FISTULA W/THROMBOLYSIS/PTA INC/SHUNT/IMG RIGHT Right 04/26/2018   IR US  GUIDE VASC ACCESS LEFT  02/17/2024   IR US  GUIDE VASC ACCESS RIGHT  04/26/2018   IR US  GUIDE VASC ACCESS RIGHT  09/07/2018   IR US  GUIDE VASC ACCESS RIGHT  10/16/2020   IR US  GUIDE VASC ACCESS RIGHT  10/16/2020   IR US  GUIDE VASC ACCESS RIGHT  10/03/2022   ORIF FEMUR FRACTURE Right 11/22/2016   Procedure: OPEN REDUCTION INTERNAL FIXATION (ORIF) DISTAL FEMUR FRACTURE;  Surgeon: Adonica Hoose, MD;  Location: [facility] OR;  Service: Orthopedics;  Laterality: Right;   PATCH ANGIOPLASTY Right 12/10/2017   Procedure: PATCH ANGIOPLASTY;  Surgeon: Dannis Dy, MD;  Location: [facility] OR;  Service: Vascular;  Laterality: Right;   PERIPHERAL VASCULAR  BALLOON ANGIOPLASTY  08/13/2018   Procedure: PERIPHERAL VASCULAR BALLOON ANGIOPLASTY;  Surgeon: Richrd Char, MD;  Location: [facility] INVASIVE CV LAB;  Service: Cardiovascular;;  right AV fistula    PERIPHERAL VASCULAR BALLOON ANGIOPLASTY  11/22/2018   Procedure: PERIPHERAL VASCULAR BALLOON ANGIOPLASTY;  Surgeon: Adine Hoof, MD;  Location: [facility] INVASIVE CV LAB;  Service: Cardiovascular;;  rt AV fistula   PERIPHERAL VASCULAR BALLOON ANGIOPLASTY  10/08/2022   Procedure: PERIPHERAL VASCULAR BALLOON ANGIOPLASTY;  Surgeon: Kayla Part, MD;  Location: [facility] INVASIVE CV LAB;  Service: Cardiovascular;;  arterial anastamosis   PERIPHERAL VASCULAR CATHETERIZATION N/A 10/08/2015   Procedure: A/V Shuntogram;  Surgeon: Dannis Dy, MD;  Location: [facility] INVASIVE  CV LAB;  Service: Cardiovascular;  Laterality: N/A;   PERIPHERAL VASCULAR INTERVENTION Left 10/08/2022   Procedure: PERIPHERAL VASCULAR INTERVENTION;  Surgeon: Kayla Part, MD;  Location: [facility] INVASIVE CV LAB;  Service: Cardiovascular;  Laterality: Left;  left AVF   REMOVAL OF A DIALYSIS CATHETER N/A 12/03/2021   Procedure: MINOR REMOVAL OF A TUNNELED DIALYSIS CATHETER;  Surgeon: Mayo Speck, MD;  Location: [facility];  Service: Vascular;  Laterality: N/A;   REMOVAL OF A DIALYSIS CATHETER N/A 11/18/2022   Procedure: MINOR REMOVAL OF A TUNNELED DIALYSIS CATHETER;  Surgeon: Mayo Speck, MD;  Location: [facility];  Service: Vascular;  Laterality: N/A;   THROMBECTOMY W/ EMBOLECTOMY Right 12/10/2017   Procedure: THROMBECTOMY REVISION RIGHT ARM  ARTERIOVENOUS FISTULA;  Surgeon: Dannis Dy, MD;  Location: [facility] OR;  Service: Vascular;  Laterality: Right;   TRANSURETHRAL RESECTION OF PROSTATE N/A 01/04/2014   Procedure: TRANSURETHRAL RESECTION OF THE PROSTATE (TURP) (procedure #2);  Surgeon: Mohammad I Javaid, MD;  Location: [facility]S;  Service: Urology;  Laterality: N/A;    Social History   Socioeconomic History   Marital status: Single    Spouse name: Not on file   Number of children: Not on file   Years of education: Not on file   Highest education level: Not on file  Occupational History   Occupation: retired   Tobacco Use   Smoking status: Never   Smokeless tobacco: Never  Vaping Use   Vaping status: Never Used  Substance and Sexual Activity   Alcohol use: No   Drug use: No   Sexual activity: Not Currently  Other Topics Concern   Not on file  Social History Narrative   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   Long term resident of [facility]    Social Drivers of Health   Financial Resource Strain: Low Risk  (02/18/2018)   Overall Financial Resource Strain (CARDIA)    Difficulty of Paying Living Expenses: Not hard at all  Food Insecurity: No Food  Insecurity (02/17/2024)   Hunger Vital Sign    Worried About Running Out of Food in the Last Year: Never true    Ran Out of Food in the Last Year: Never true  Transportation Needs: No Transportation Needs (02/17/2024)   PRAPARE - Administrator, Civil Service (Medical): No    Lack of Transportation (Non-Medical): No  Physical Activity: Unknown (09/15/2019)   Exercise Vital Sign    Days of Exercise per Week: Not on file    Minutes of Exercise per Session: 0 min  Stress: No Stress Concern Present (02/18/2018)   [employer] of Occupational Health - Occupational Stress Questionnaire    Feeling of Stress : Not at all  Social Connections: Socially Isolated (02/17/2024)   Social  Connection and Isolation Panel [NHANES]    Frequency of Communication with Friends and Family: Three times a week    Frequency of Social Gatherings with Friends and Family: Once a week    Attends Religious Services: Never    Database administrator or Organizations: No    Attends Banker Meetings: Never    Marital Status: Never married  Intimate Partner Violence: Not At Risk (02/17/2024)   Humiliation, Afraid, Rape, and Kick questionnaire    Fear of Current or Ex-Partner: No    Emotionally Abused: No    Physically Abused: No    Sexually Abused: No   Family History  Problem Relation Age of Onset   Cancer Mother    Colon cancer Neg Hx       VITAL SIGNS BP 116/68   Pulse 76   Temp 98.7 F (37.1 C)   Resp 20   Ht 5\' 9"  (1.753 m)   Wt 206 lb 12.8 oz (93.8 kg)   SpO2 97%   BMI 30.54 kg/m   Outpatient Encounter Medications as of 02/19/2024  Medication Sig Note   aspirin  81 MG chewable tablet Chew 81 mg by mouth daily.    insulin  glargine-yfgn (SEMGLEE , YFGN,) 100 UNIT/ML Pen Inject 8 Units into the skin at bedtime.    levothyroxine  (SYNTHROID ) 150 MCG tablet Take 150 mcg by mouth daily before breakfast.    midodrine  (PROAMATINE ) 10 MG tablet Take 10 mg by mouth See admin  instructions. Send medication with resident on Tues/Thurs/Sat to dialysis. Do not administer at [facility], give to resident to take with him to dialysis. Send #3 tablets of 10mg  and #3 tablets of 5mg  to equal 15mg  per dose. 02/17/2024: Confirmed per [facility] - pt only receives while at dialysis on Tues/Thurs/Sat. Pt receives 2 strengths of midodrine  to equal 15mg  per dose.   midodrine  (PROAMATINE ) 5 MG tablet Take 5 mg by mouth See admin instructions. Send medication with resident on Tues/Thurs/Sat to dialysis. Do not administer at [facility], give to resident to take with him to dialysis. Send #3 tablets of 5mg  and #3 tablets of 10mg  to equal 15mg  per dose.    Omega-3 Fatty Acids (OMEGA-3 FISH OIL PO) Take 2 capsules by mouth at bedtime. Omega 3 - DHA - EPA - Fish oil 300-1000mg  capsules    rosuvastatin  (CRESTOR ) 20 MG tablet Take 20 mg by mouth at bedtime.    sevelamer  carbonate (RENVELA ) 800 MG tablet Take 3 tablets (2,400 mg total) by mouth 3 (three) times daily with meals.    tamsulosin  (FLOMAX ) 0.4 MG CAPS capsule Take 0.4 mg by mouth at bedtime. Give 30 minutes after a meal. Do not crush or chew    torsemide  (DEMADEX ) 20 MG tablet Take 20 mg by mouth See admin instructions. Give 1 tablet (20mg ) by mouth once a day in the afternoon on Sunday, Monday, Wednesday, Friday.    vitamin B-12 (CYANOCOBALAMIN ) 1000 MCG tablet Take 1,000 mcg by mouth at bedtime.    No facility-administered encounter medications on file as of 02/19/2024.     SIGNIFICANT DIAGNOSTIC EXAMS  LABS REVIEWED PREVIOUS;      02-13-23: wbc 3.8; hgb 9.7; hct 28.7; mcv 101.1 plt 126; glucose 67; bun 18; creat 6.28; k+ 2.9; na++ 137; ca 8.8; gfr 9; protein 6.9; albumin  3.2 mag 1.8; occult blood: neg 02-23-23: hgb 9.8; hct 29.7 glucose 110; bun 24; creat 8.32 k+ 3.6; na++ 139; ca 9.6 gfr 6  06-11-23: wbc 4.4; hgb 11.7;  hct 35.2; mcv 102.3 plt 88; glucose 160; bun 20; creat 4.71; k+ 3.5; na++ 137; ca 8.4; gfr 12   06-13-23: wbc 3.6; hgb  13.0; hct 39.2; mcv 102.6 plt 110; glucose 103; bun 11; creat 4.13; k+ 3.3; na++ 138; ca 8.6; gfr 14; d-dimer 0.54; CRP <0.5  06-16-23: glucose 99; bun 40; creat 10.88; k+ 4.6; na++ 136; ca 8.8; gfr 5 07-16-23: hgb A1c 5.7; chol 134; ldl 39; trig 486; hdl 24 09-03-23: tsh 3.852 free t4: 0.82 09-14-23: protein 6.9 albumin  3.2; chol 127; ldl 37 trig 420 hdl 25  TODAY  12-17-23: wbc 3.9; hgb 10.6; hct 30.5; mcv 103.7 plt 110; hgb A1c 6.6; PSA 8.02; chol 118; ldl 45; trig 222; hdl 29; tsh 10.663 02-16-24: wbc 4.3; hgb 9.3; hct 27.9; mcv 104.5 plt 81; glucose 138; bun 80; creat 17.85; k+ 5.8; na++ 134; ca 9.3 gfr 2; protein 7.3 albumin  3.4 02-18-24: wbc 3.9; hgb 8.7; hct 25.5; mcv 102.4 plt 78; glucose 133; bun 52; creat 13.86; k+ 4.7; na++ 137; ca 8.7; gfr 3; protein 6.6 albumin  3.1 mag 2.5 phos 6.9    Review of Systems  Constitutional:  Negative for malaise/fatigue.  Respiratory:  Negative for cough and shortness of breath.   Cardiovascular:  Negative for chest pain, palpitations and leg swelling.  Gastrointestinal:  Negative for abdominal pain, constipation and heartburn.  Musculoskeletal:  Negative for back pain, joint pain and myalgias.  Skin: Negative.   Neurological:  Negative for dizziness.  Psychiatric/Behavioral:  The patient is not nervous/anxious.    Physical Exam Constitutional:      General: He is not in acute distress.    Appearance: He is well-developed. He is obese. He is not diaphoretic.  Neck:     Thyroid : No thyromegaly.  Cardiovascular:     Rate and Rhythm: Normal rate and regular rhythm.     Pulses: Normal pulses.     Heart sounds: Normal heart sounds.  Pulmonary:     Effort: Pulmonary effort is normal. No respiratory distress.     Breath sounds: Normal breath sounds.  Abdominal:     General: Bowel sounds are normal. There is no distension.     Palpations: Abdomen is soft.     Tenderness: There is no abdominal tenderness.  Musculoskeletal:        General: Normal  range of motion.     Cervical back: Neck supple.     Right lower leg: No edema.     Left lower leg: No edema.  Lymphadenopathy:     Cervical: No cervical adenopathy.  Skin:    General: Skin is warm and dry.     Comments: Left arm fistula +thrill+bruit    Neurological:     Mental Status: He is alert. Mental status is at baseline.     Comments: SLUMS 2/30   Psychiatric:        Mood and Affect: Mood normal.          ASSESSMENT/ PLAN:  TODAY  Other specified hypothyroidism: tsh 10.663 is on synthroid  150 mcg daily will monitor   2. Anemia of chronic renal disease on chronic dialysis hgb 9.3; hct 25.5   3. Major depression single episode moderate: is presently off medications.   4. Osteopenia: t score -2.052 is on supplements  5. GERD without esophagitis: is off PPI   6. History of colon cancer: s/p colectomy last seen by GI on 12-19-21; ct scan done on 12-30-21.  7. Dyslipidemia associated with type 2  diabetes mellitus: ldl 37 trig 420 will continue crestor   20 mg daily    8. Vitamin B 12 deficiency: is on daily supplements  9. Secondary hyperparathyroidism of renal origin: will monitor   10. Bilateral lower extremity edema: will continue demedex 20 mg four days per week.   11. Type 2 diabetes mellitus with hypertension and end stage renal disease on hemodialysis: hgb A1c 6.6 will continue basaglar  8 units nightly is on asa and statin   12. Thrombocytopenia: plt 81  13. Aortic atherosclerosis (ct 12-30-21) is on asa and statin  14. 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: on 3 days weekly; no longer on fluid restriction is unable to adhere; is on renvela  2400 mg three times daily   15. BPH: will continue flomax  0.4 mg daily   16. Neurocognitive deficits: SLUMS 2/30    Britt Candle NP [facility]  call [phone]

## 2024-02-20 DIAGNOSIS — Z992 Dependence on renal dialysis: Secondary | ICD-10-CM | POA: Diagnosis not present

## 2024-02-20 DIAGNOSIS — N2581 Secondary hyperparathyroidism of renal origin: Secondary | ICD-10-CM | POA: Diagnosis not present

## 2024-02-20 DIAGNOSIS — D509 Iron deficiency anemia, unspecified: Secondary | ICD-10-CM | POA: Diagnosis not present

## 2024-02-20 DIAGNOSIS — D631 Anemia in chronic kidney disease: Secondary | ICD-10-CM | POA: Diagnosis not present

## 2024-02-20 DIAGNOSIS — N186 End stage renal disease: Secondary | ICD-10-CM | POA: Diagnosis not present

## 2024-02-22 ENCOUNTER — Non-Acute Institutional Stay (SKILLED_NURSING_FACILITY): Payer: Self-pay | Admitting: Internal Medicine

## 2024-02-22 ENCOUNTER — Encounter: Payer: Self-pay | Admitting: Internal Medicine

## 2024-02-22 DIAGNOSIS — E538 Deficiency of other specified B group vitamins: Secondary | ICD-10-CM | POA: Diagnosis not present

## 2024-02-22 DIAGNOSIS — N186 End stage renal disease: Secondary | ICD-10-CM | POA: Diagnosis not present

## 2024-02-22 DIAGNOSIS — D696 Thrombocytopenia, unspecified: Secondary | ICD-10-CM

## 2024-02-22 DIAGNOSIS — E1122 Type 2 diabetes mellitus with diabetic chronic kidney disease: Secondary | ICD-10-CM | POA: Diagnosis not present

## 2024-02-22 DIAGNOSIS — Z992 Dependence on renal dialysis: Secondary | ICD-10-CM

## 2024-02-22 DIAGNOSIS — E034 Atrophy of thyroid (acquired): Secondary | ICD-10-CM | POA: Diagnosis not present

## 2024-02-22 NOTE — Assessment & Plan Note (Signed)
 MCV 104.5; H/H 8.7/25.5.  He is on 1000 mcg of B12; last B12 on record was low normal at 272 on 11/22/2021.  B12 level should be updated to verify adequate absorption.

## 2024-02-22 NOTE — Assessment & Plan Note (Signed)
 TSH will be updated along with B12 level to verify adequate replacement.

## 2024-02-22 NOTE — Patient Instructions (Addendum)
 See assessment and plan under each diagnosis in the problem list and acutely for this visit :  End stage renal disease on dialysis due to type 2 diabetes mellitus (HCC) While hospitalized 4/22 - 02/18/2024 to correct obstructive vascular dialysis access; glucoses ranged from 78 up to 126.  Current A1c is 6.8% on 8 units of glargine nightly.  This represents excellent control. While hospitalized creatinine ranged from 10.37 up to a peak of 17.85.  GFR ranged from a nadir of 2 up to a high of 5.  Initial potassium was 5.8 but this was corrected to 4.7 prior to discharge. He received dialysis following correction of the vascular obstruction on 4/23 and 4/24.  Course was complicated by hypotension requiring IV fluids as well as midodrine .  Vitamin B 12 deficiency MCV 104.5; H/H 8.7/25.5.  He is on 1000 mcg of B12; last B12 on record was low normal at 272 on 11/22/2021.  B12 level should be updated to verify adequate absorption.  Thrombocytopenia (HCC) Nadir platelet count was 78,000 while hospitalized for obstructed dialysis vascular access.  No bleeding dyscrasias reported; continue to monitor.  Hypothyroidism TSH will be updated along with B12 level to verify adequate replacement.

## 2024-02-22 NOTE — Assessment & Plan Note (Addendum)
 Nadir platelet count was 78,000 while hospitalized for obstructed dialysis vascular access.  No bleeding dyscrasias reported; continue to monitor.

## 2024-02-22 NOTE — Progress Notes (Signed)
 NURSING HOME LOCATION:  [facility] ROOM NUMBER:  [id number]  CODE STATUS:  Full Code  PCP: Marilyne Shu, NP   This is a nursing facility follow up visit for Nursing Facility readmission within 30 days.  Interim medical record and care since last SNF visit was updated with review of diagnostic studies and change in clinical status since last visit were documented.  HPI: He was hospitalized 4/22 - 02/18/2024 because of obstructed vascular hemodialysis access.  IR performed an US  guided access to the left upper extremity; fistulogram; and mechanical and pharmacologic thrombectomy.  Access was restored without complication.  Dialysis was completed the morning of 4/23 and 4/24.  Following dialysis hypotension was present associated with tachycardia necessitating small IV fluid bolus and midodrine .   While hospitalized creatinine ranged from 10.37 up to a peak of 17.85.  GFR ranged from a nadir of 2 up to max of 5.  Initial potassium was elevated at 5.8 but final value was 4.7.  Total protein was normal; but albumin  was 3.1. Macrocytic anemia was present with an MCV of 104.5.  The last B12 level on record was low normal at 272 on 11/22/2021.  He is on 1000 mcg of B12 supplementation orally.  Initial H/H was 9.3/27.9 ;  final .8.7/2.5.5. Thrombocytopenia was present with a nadir platelet count of 78,000. His most recent TSH was 10.663; he is on relatively high dose L-thyroxine supplementation at 150 mg daily.   Current A1c is 6.6%.  While hospitalized glucoses ranged from 78 up to 126.  He is on 8 units of glargine at nightly. Blood pressure stabilized and he was cleared for discharge back to the facility.  Review of systems: Dementia invalidated responses.  When asked why he been hospitalized his reply was "on my arm" pointing to the left biceps area.  He denies any associated discomfort in this area.  In fact review of systems was totally negative.  Constitutional: No fever,  significant weight change, fatigue  Eyes: No redness, discharge, pain, vision change ENT/mouth: No nasal congestion,  purulent discharge, earache, change in hearing, sore throat  Cardiovascular: No chest pain, palpitations, paroxysmal nocturnal dyspnea, edema  Respiratory: No cough, sputum production, hemoptysis, DOE, significant snoring, apnea   Gastrointestinal: No heartburn, dysphagia, abdominal pain, nausea /vomiting, rectal bleeding, melena, change in bowels Genitourinary: No dysuria, hematuria, pyuria, incontinence, nocturia Musculoskeletal: No joint stiffness, joint swelling, weakness, pain Dermatologic: No rash, pruritus, change in appearance of skin Neurologic: No dizziness, headache, syncope, seizures, numbness, tingling Psychiatric: No significant anxiety, depression, insomnia, anorexia Endocrine: No change in hair/skin/nails, excessive thirst, excessive hunger, excessive urination  Hematologic/lymphatic: No significant bruising, lymphadenopathy, abnormal bleeding Allergy/immunology: No itchy/watery eyes, significant sneezing, urticaria, angioedema  Physical exam:  Pertinent or positive findings:He is wheelchair bound. Facies tend to be blank.  His speech is somewhat stuttering and answers are typically monosyllabic and slightly hyponasal.  Dental hygiene is extremely poor with staining and dense callus formation.  Breath sounds are slightly decreased.  Abdomen is slightly protuberant.  Bowel sounds are active.  Pedal pulses are decreased.  He has trace edema at the sock line.  There is a resting flexion tremor of the right thumb.  Linear hyperpigmentation is present at the vascular sites and there is evidence of subcutaneous scarring of the upper extremities.  Deep tendon reflexes are 0-1/2+ & equal.  General appearance: no acute distress, increased work of breathing is present.   Lymphatic: No lymphadenopathy about the head, neck, axilla.  Eyes: No conjunctival inflammation or lid  edema is present. There is no scleral icterus. Ears:  External ear exam shows no significant lesions or deformities.   Nose:  External nasal examination shows no deformity or inflammation. Nasal mucosa are pink and moist without lesions, exudates Neck:  No thyromegaly, masses, tenderness noted.    Heart:  Normal rate and regular rhythm. S1 and S2 normal without gallop, murmur, click, rub .  Lungs:  without wheezes, rhonchi, rales, rubs. Abdomen: Bowel sounds are normal. Abdomen is soft and nontender with no organomegaly, hernias, masses. GU: Deferred  Extremities:  No cyanosis, clubbing, edema  Neurologic exam :Balance, Rhomberg, finger to nose testing could not be completed due to clinical state Skin: Warm & dry w/o tenting. No significant lesions or rash.  See summary under each active problem in the Problem List with associated updated therapeutic plan

## 2024-02-22 NOTE — Assessment & Plan Note (Addendum)
 While hospitalized 4/22 - 02/18/2024 to correct obstructive vascular dialysis access; glucoses ranged from 78 up to 126.  Current A1c is 6.8% on 8 units of glargine nightly.  This represents excellent control. While hospitalized creatinine ranged from 10.37 up to a peak of 17.85.  GFR ranged from a nadir of 2 up to a high of 5.  Initial potassium was 5.8 but this was corrected to 4.7 prior to discharge. He received dialysis following correction of the vascular obstruction on 4/23 and 4/24.  Course was complicated by hypotension requiring IV fluids as well as midodrine .

## 2024-02-23 ENCOUNTER — Other Ambulatory Visit (HOSPITAL_COMMUNITY)
Admission: RE | Admit: 2024-02-23 | Discharge: 2024-02-23 | Disposition: A | Discharge status: Home / Self Care | Source: Skilled Nursing Facility | Attending: Adult Health | Admitting: Adult Health

## 2024-02-23 DIAGNOSIS — Z992 Dependence on renal dialysis: Secondary | ICD-10-CM | POA: Diagnosis not present

## 2024-02-23 DIAGNOSIS — E039 Hypothyroidism, unspecified: Secondary | ICD-10-CM | POA: Diagnosis not present

## 2024-02-23 DIAGNOSIS — D509 Iron deficiency anemia, unspecified: Secondary | ICD-10-CM | POA: Diagnosis not present

## 2024-02-23 DIAGNOSIS — N186 End stage renal disease: Secondary | ICD-10-CM | POA: Diagnosis not present

## 2024-02-23 DIAGNOSIS — D631 Anemia in chronic kidney disease: Secondary | ICD-10-CM | POA: Diagnosis not present

## 2024-02-23 DIAGNOSIS — N2581 Secondary hyperparathyroidism of renal origin: Secondary | ICD-10-CM | POA: Diagnosis not present

## 2024-02-23 LAB — CBC
HCT: 26.8 % — ABNORMAL LOW (ref 39.0–52.0)
Hemoglobin: 8.8 g/dL — ABNORMAL LOW (ref 13.0–17.0)
MCH: 34.8 pg — ABNORMAL HIGH (ref 26.0–34.0)
MCHC: 32.8 g/dL (ref 30.0–36.0)
MCV: 105.9 fL — ABNORMAL HIGH (ref 80.0–100.0)
Platelets: 140 10*3/uL — ABNORMAL LOW (ref 150–400)
RBC: 2.53 MIL/uL — ABNORMAL LOW (ref 4.22–5.81)
RDW: 13.2 % (ref 11.5–15.5)
WBC: 4.8 10*3/uL (ref 4.0–10.5)
nRBC: 0 % (ref 0.0–0.2)

## 2024-02-23 LAB — VITAMIN B12: Vitamin B-12: 4099 pg/mL — ABNORMAL HIGH (ref 180–914)

## 2024-02-23 LAB — TSH: TSH: 2.608 u[IU]/mL (ref 0.350–4.500)

## 2024-02-24 ENCOUNTER — Non-Acute Institutional Stay (SKILLED_NURSING_FACILITY): Payer: Self-pay | Admitting: Adult Health

## 2024-02-24 ENCOUNTER — Encounter: Payer: Self-pay | Admitting: Adult Health

## 2024-02-24 DIAGNOSIS — Z992 Dependence on renal dialysis: Secondary | ICD-10-CM | POA: Diagnosis not present

## 2024-02-24 DIAGNOSIS — N186 End stage renal disease: Secondary | ICD-10-CM | POA: Diagnosis not present

## 2024-02-24 DIAGNOSIS — Z0000 Encounter for general adult medical examination without abnormal findings: Secondary | ICD-10-CM

## 2024-02-24 NOTE — Progress Notes (Signed)
 Subjective:   Zachary [name] is a 78 y.o. male who presents for Medicare Annual/Subsequent preventive examination.  Visit Complete: In person  Patient Medicare AWV questionnaire was completed by the patient on 02-24-2024; I have confirmed that all information answered by patient is correct and no changes since this date.  Cardiac Risk Factors include: advanced age (>13men, >65 women);diabetes mellitus;dyslipidemia;hypertension;male gender;obesity (BMI >30kg/m2);sedentary lifestyle     Objective:    Today's Vitals   02/24/24 1008  BP: 118/75  Pulse: 60  Resp: 20  Temp: 98.4 F (36.9 C)  SpO2: 99%  Weight: 207 lb 3.2 oz (94 kg)  Height: 5\' 9"  (1.753 m)   Body mass index is 30.6 kg/m.     02/17/2024   10:56 AM 02/16/2024    7:18 AM 09/15/2023   11:04 AM 07/16/2023    3:25 PM 07/15/2023   11:14 AM 06/16/2023    7:23 AM 06/11/2023   10:51 AM  Advanced Directives  Does Patient Have a Medical Advance Directive? No No Yes Yes Yes No No  Type of Advance Directive   Out of facility DNR (pink MOST or yellow form) Out of facility DNR (pink MOST or yellow form) Out of facility DNR (pink MOST or yellow form)    Does patient want to make changes to medical advance directive?   No - Patient declined No - Patient declined No - Patient declined    Would patient like information on creating a medical advance directive? No - Patient declined No - Patient declined     No - Patient declined  Pre-existing out of facility DNR order (yellow form or pink MOST form)   Pink MOST form placed in chart (order not valid for inpatient use) Pink MOST form placed in chart (order not valid for inpatient use) Pink MOST form placed in chart (order not valid for inpatient use)      Current Medications (verified) Outpatient Encounter Medications as of 02/24/2024  Medication Sig   aspirin  81 MG chewable tablet Chew 81 mg by mouth daily.   insulin  glargine-yfgn (SEMGLEE , YFGN,) 100 UNIT/ML Pen Inject 8 Units into  the skin at bedtime.   levothyroxine  (SYNTHROID ) 150 MCG tablet Take 150 mcg by mouth daily before breakfast.   midodrine  (PROAMATINE ) 10 MG tablet Take 10 mg by mouth See admin instructions. Send medication with resident on Tues/Thurs/Sat to dialysis. Do not administer at [facility], give to resident to take with him to dialysis. Send #3 tablets of 10mg  and #3 tablets of 5mg  to equal 15mg  per dose.   midodrine  (PROAMATINE ) 5 MG tablet Take 5 mg by mouth See admin instructions. Send medication with resident on Tues/Thurs/Sat to dialysis. Do not administer at [facility], give to resident to take with him to dialysis. Send #3 tablets of 5mg  and #3 tablets of 10mg  to equal 15mg  per dose.   Omega-3 Fatty Acids (OMEGA-3 FISH OIL PO) Take 2 capsules by mouth at bedtime. Omega 3 - DHA - EPA - Fish oil 300-1000mg  capsules   rosuvastatin  (CRESTOR ) 20 MG tablet Take 20 mg by mouth at bedtime.   sevelamer  carbonate (RENVELA ) 800 MG tablet Take 3 tablets (2,400 mg total) by mouth 3 (three) times daily with meals.   tamsulosin  (FLOMAX ) 0.4 MG CAPS capsule Take 0.4 mg by mouth at bedtime. Give 30 minutes after a meal. Do not crush or chew   torsemide  (DEMADEX ) 20 MG tablet Take 20 mg by mouth See admin instructions. Give 1 tablet (20mg )  by mouth once a day in the afternoon on Sunday, Monday, Wednesday, Friday.   vitamin B-12 (CYANOCOBALAMIN ) 1000 MCG tablet Take 1,000 mcg by mouth at bedtime.   No facility-administered encounter medications on file as of 02/24/2024.    Allergies (verified) Patient has no known allergies.   History: Past Medical History:  Diagnosis Date   Abnormal CT scan, kidney 10/06/2011   Acute pyelonephritis 10/07/2011   Anemia    normocytic   Anxiety    mental retardation   Bladder wall thickening 10/06/2011   BPH (benign prostatic hypertrophy)    Diabetes mellitus    Dialysis patient (HCC)    Tuesday, Thursday and Saturday,    DVT of leg (deep venous thrombosis) (HCC)  12/25/2016   Edema     history of lower extremity edema   GERD (gastroesophageal reflux disease)    Heme positive stool    Hydronephrosis    Hyperkalemia    Hyperlipidemia    Hypernatremia    Hypertension    Hypothyroidism    Impaired speech    Infected prosthetic vascular graft (HCC)    MR (mental retardation)    Muscle weakness    Obstructive uropathy    Perinephric abscess 10/07/2011   Poor historian poor historian   Primary colorectal adenocarcinoma (HCC) 04/2017   S/p total colectomy   Protein calorie malnutrition (HCC)    Pyelonephritis    Renal failure (ARF), acute on chronic (HCC)    Renal insufficiency    chronic history   Sepsis (HCC)    Smoking    Uremia    Urinary retention    UTI (lower urinary tract infection) 10/06/2011   Past Surgical History:  Procedure Laterality Date   A/V FISTULAGRAM N/A 08/13/2018   Procedure: A/V FISTULAGRAM - Right Upper;  Surgeon: Richrd Char, MD;  [facility]: [facility] INVASIVE CV LAB;  Service: Cardiovascular;  Laterality: N/A;   A/V FISTULAGRAM N/A 11/22/2018   Procedure: A/V FISTULAGRAM - Right Upper;  Surgeon: Adine Hoof, MD;  Location: [facility] INVASIVE CV LAB;  Service: Cardiovascular;  Laterality: N/A;   A/V FISTULAGRAM Left 10/08/2022   Procedure: A/V Fistulagram;  Surgeon: Kayla Part, MD;  Location: [facility] INVASIVE CV LAB;  Service: Cardiovascular;  Laterality: Left;   A/V FISTULAGRAM Left 05/06/2023   Procedure: A/V Fistulagram;  Surgeon: Carlene Che, MD;  Location: [facility] INVASIVE CV LAB;  Service: Cardiovascular;  Laterality: Left;   AV FISTULA PLACEMENT Left 07/06/2015   Procedure:  INSERTION LEFT ARM ARTERIOVENOUS GORTEX GRAFT;  Surgeon: Dannis Dy, MD;  Location: [facility] OR;  Service: Vascular;  Laterality: Left;   AV FISTULA PLACEMENT Right 02/26/2016   Procedure: ARTERIOVENOUS (AV) FISTULA CREATION ;  Surgeon: Dannis Dy, MD;  Location: [facility] OR;  Service: Vascular;  Laterality: Right;   AV  FISTULA PLACEMENT Right 11/25/2018   Procedure: INSERTION OF ARTERIOVENOUS (AV) ARTEGRAFT RIGHT UPPER ARM;  Surgeon: Adine Hoof, MD;  Location: [facility] OR;  Service: Vascular;  Laterality: Right;   AV FISTULA PLACEMENT Left 05/28/2021   Procedure: LEFT ARM ARTERIOVENOUS (AV) FISTULA;  Surgeon: Mayo Speck, MD;  Location: [facility] ORS;  Service: Vascular;  Laterality: Left;   AVGG REMOVAL Left 10/09/2015   Procedure: REMOVAL OF ARTERIOVENOUS GORETEX GRAFT (AVGG) Evacuation of Lymphocele, Vein Patch angioplasty of brachial artery.;  Surgeon: Dannis Dy, MD;  Location: [facility] OR;  Service: Vascular;  Laterality: Left;   BASCILIC VEIN TRANSPOSITION Right 02/26/2016   Procedure: Right BASCILIC VEIN TRANSPOSITION;  Surgeon: Dannis Dy, MD;  Location: [facility] OR;  Service: Vascular;  Laterality: Right;   BASCILIC VEIN TRANSPOSITION Left 07/30/2021   Procedure: LEFT ARM SECOND STAGE BASILIC VEIN TRANSPOSITION;  Surgeon: Mayo Speck, MD;  Location: [facility]S;  Service: Vascular;  Laterality: Left;   CIRCUMCISION N/A 01/04/2014   Procedure: CIRCUMCISION ADULT (procedure #1);  Surgeon: Mohammad I Javaid, MD;  Location: [facility] OR[facility];  Service: Urology;  Laterality: N/A;   COLECTOMY N/A 05/04/2017   Procedure: TOTAL COLECTOMY;  Surgeon: Alanda Allegra, MD;  Location: [facility] ORS;  Service: General;  Laterality: N/A;   COLONOSCOPY N/A 04/27/2017   Surgeon: Suzette Espy, MD; annular mass in the ascending colon likely representing cancer biopsied, multiple 6-22 mm polyps removed, clean rectum.  Pathology with multiple tubular adenomas, high-grade dysplasia noted in ascending colon and splenic flexure.   CYSTOSCOPY W/ RETROGRADES Bilateral 06/29/2015   Procedure: CYSTOSCOPY, DILATION OF URETHRAL STRICTURE WITH BILATERAL RETROGRADE PYELOGRAM,SUPRAPUBIC TUBE CHANGE;  Surgeon: Christina Coyer, MD;  Location: [facility] ORS;  Service: Urology;  Laterality: Bilateral;   CYSTOSCOPY WITH URETHRAL DILATATION N/A  12/29/2013   Procedure: CYSTOSCOPY WITH URETHRAL DILATATION;  Surgeon: Reggie Caper, MD;  Location: [facility];  Service: Urology;  Laterality: N/A;   ESOPHAGOGASTRODUODENOSCOPY N/A 04/27/2017   Procedure: ESOPHAGOGASTRODUODENOSCOPY (EGD);  Surgeon: Suzette Espy, MD;  [facility];  Service: Endoscopy;  Laterality: N/A;   FLEXIBLE SIGMOIDOSCOPY N/A 12/02/2021   Procedure: FLEXIBLE SIGMOIDOSCOPY;  Surgeon: Vinetta Greening, DO;  [facility];  Service: Endoscopy;  Laterality: N/A;  10:30am, dialysis patient   INSERTION OF DIALYSIS CATHETER Right 11/25/2018   Procedure: INSERTION OF DIALYSIS CATHETER RIGHT INTERNAL JUGULAR;  Surgeon: Adine Hoof, MD;  Location: [facility] OR;  Service: Vascular;  Laterality: Right;   IR AV DIALY SHUNT INTRO NEEDLE/INTRAC INITIAL W/PTA/STENT/IMG LT Left 02/16/2024   IR AV DIALY SHUNT INTRO NEEDLE/INTRACATH INITIAL W/PTA/IMG RIGHT Right 09/07/2018   IR FLUORO GUIDE CV LINE RIGHT  10/16/2020   IR FLUORO GUIDE CV LINE RIGHT  10/03/2022   IR REMOVAL TUN CV CATH W/O FL  01/12/2019   IR THROMBECTOMY AV FISTULA W/THROMBOLYSIS/PTA INC/SHUNT/IMG RIGHT Right 04/26/2018   IR US  GUIDE VASC ACCESS LEFT  02/17/2024   IR US  GUIDE VASC ACCESS RIGHT  04/26/2018   IR US  GUIDE VASC ACCESS RIGHT  09/07/2018   IR US  GUIDE VASC ACCESS RIGHT  10/16/2020   IR US  GUIDE VASC ACCESS RIGHT  10/16/2020   IR US  GUIDE VASC ACCESS RIGHT  10/03/2022   ORIF FEMUR FRACTURE Right 11/22/2016   Procedure: OPEN REDUCTION INTERNAL FIXATION (ORIF) DISTAL FEMUR FRACTURE;  Surgeon: Adonica Hoose, MD;  Location: [facility] OR;  Service: Orthopedics;  Laterality: Right;   PATCH ANGIOPLASTY Right 12/10/2017   Procedure: PATCH ANGIOPLASTY;  Surgeon: Dannis Dy, MD;  Location: [facility] OR;  Service: Vascular;  Laterality: Right;   PERIPHERAL VASCULAR BALLOON ANGIOPLASTY  08/13/2018   Procedure: PERIPHERAL VASCULAR BALLOON ANGIOPLASTY;  Surgeon: Richrd Char, MD;  [facility]: [facility]  INVASIVE CV LAB;  Service: Cardiovascular;;  right AV fistula    PERIPHERAL VASCULAR BALLOON ANGIOPLASTY  11/22/2018   Procedure: PERIPHERAL VASCULAR BALLOON ANGIOPLASTY;  Surgeon: Adine Hoof, MD;  Location: [facility] INVASIVE CV LAB;  Service: Cardiovascular;;  rt AV fistula   PERIPHERAL VASCULAR BALLOON ANGIOPLASTY  10/08/2022   Procedure: PERIPHERAL VASCULAR BALLOON ANGIOPLASTY;  Surgeon: Kayla Part, MD;  Location: [facility] INVASIVE CV LAB;  Service: Cardiovascular;;  arterial anastamosis   PERIPHERAL  VASCULAR CATHETERIZATION N/A 10/08/2015   Procedure: A/V Shuntogram;  Surgeon: Dannis Dy, MD;  Location: [facility] INVASIVE CV LAB;  Service: Cardiovascular;  Laterality: N/A;   PERIPHERAL VASCULAR INTERVENTION Left 10/08/2022   Procedure: PERIPHERAL VASCULAR INTERVENTION;  Surgeon: Kayla Part, MD;  Location: [facility] INVASIVE CV LAB;  Service: Cardiovascular;  Laterality: Left;  left AVF   REMOVAL OF A DIALYSIS CATHETER N/A 12/03/2021   Procedure: MINOR REMOVAL OF A TUNNELED DIALYSIS CATHETER;  Surgeon: Mayo Speck, MD;  Location: [facility];  Service: Vascular;  Laterality: N/A;   REMOVAL OF A DIALYSIS CATHETER N/A 11/18/2022   Procedure: MINOR REMOVAL OF A TUNNELED DIALYSIS CATHETER;  Surgeon: Mayo Speck, MD;  Location: [facility];  Service: Vascular;  Laterality: N/A;   THROMBECTOMY W/ EMBOLECTOMY Right 12/10/2017   Procedure: THROMBECTOMY REVISION RIGHT ARM  ARTERIOVENOUS FISTULA;  Surgeon: Dannis Dy, MD;  Location: [facility] OR;  Service: Vascular;  Laterality: Right;   TRANSURETHRAL RESECTION OF PROSTATE N/A 01/04/2014   Procedure: TRANSURETHRAL RESECTION OF THE PROSTATE (TURP) (procedure #2);  Surgeon: Mohammad I Javaid, MD;  Location: [facility];  Service: Urology;  Laterality: N/A;   Family History  Problem Relation Age of Onset   Cancer Mother    Colon cancer Neg Hx    Social History   Socioeconomic History   Marital status: Single    Spouse name: Not on file   Number  of children: Not on file   Years of education: Not on file   Highest education level: Not on file  Occupational History   Occupation: retired   Tobacco Use   Smoking status: Never   Smokeless tobacco: Never  Vaping Use   Vaping status: Never Used  Substance and Sexual Activity   Alcohol use: No   Drug use: No   Sexual activity: Not Currently  Other Topics Concern   Not on file  Social History Narrative   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   Long term resident of [facility]    Social Drivers of Health   Financial Resource Strain: Low Risk  (02/18/2018)   Overall Financial Resource Strain (CARDIA)    Difficulty of Paying Living Expenses: Not hard at all  Food Insecurity: No Food Insecurity (02/17/2024)   Hunger Vital Sign    Worried About Running Out of Food in the Last Year: Never true    Ran Out of Food in the Last Year: Never true  Transportation Needs: No Transportation Needs (02/17/2024)   PRAPARE - Administrator, Civil Service (Medical): No    Lack of Transportation (Non-Medical): No  Physical Activity: Unknown (09/15/2019)   Exercise Vital Sign    Days of Exercise per Week: Not on file    Minutes of Exercise per Session: 0 min  Stress: No Stress Concern Present (02/18/2018)   [employer] of Occupational Health - Occupational Stress Questionnaire    Feeling of Stress : Not at all  Social Connections: Socially Isolated (02/17/2024)   Social Connection and Isolation Panel [NHANES]    Frequency of Communication with Friends and Family: Three times a week    Frequency of Social Gatherings with Friends and Family: Once a week    Attends Religious Services: Never    Database administrator or Organizations: No    Attends Banker Meetings: Never    Marital Status: Never married    Tobacco Counseling Counseling given: Not Answered   Clinical Intake:  Pre-visit  preparation completed: Yes  Pain : No/denies  pain     BMI - recorded: 30.6 Nutritional Status: BMI > 30  Obese Nutritional Risks: Unintentional weight loss, Failure to thrive Diabetes: Yes CBG done?: Yes CBG resulted in Enter/ Edit results?: Yes Did pt. bring in CBG monitor from home?: No  How often do you need to have someone help you when you read instructions, pamphlets, or other written materials from your doctor or pharmacy?: 5 - Always  Interpreter Needed?: No  Comments: long term resident of SNF   Activities of Daily Living    02/24/2024   10:11 AM 02/17/2024   10:50 AM  In your present state of health, do you have any difficulty performing the following activities:  Hearing? 0 0  Vision? 0 0  Difficulty concentrating or making decisions? 1 0  Walking or climbing stairs? 1   Dressing or bathing? 1   Doing errands, shopping? 1   Preparing Food and eating ? Y   Using the Toilet? Y   In the past six months, have you accidently leaked urine? Y   Do you have problems with loss of bowel control? Y   Managing your Medications? Y   Managing your Finances? Y   Housekeeping or managing your Housekeeping? Y     Patient Care Team: Marilyne Shu, NP as PCP - General (Geriatric Medicine) Chris Countryman, MD (Inactive) as Consulting Physician (Nephrology) [facility] Dialysis, [facility], Rolando Cliche, DO as Consulting Physician (Gastroenterology)  Indicate any recent Medical Services you may have received from other than [facility] providers in the past year (date may be approximate).     Assessment:   This is a routine wellness examination for Christopher.  Hearing/Vision screen No results found.   Goals Addressed             This Visit's Progress    Absence of Fall and Fall-Related Injury   On track    Evidence-based guidance:  Assess fall risk using a validated tool when available. Consider balance and gait impairment, muscle weakness, diminished vision or  hearing, environmental hazards, presence of urinary or bowel urgency and/or incontinence.  Communicate fall injury risk to interprofessional healthcare team.  Develop a fall prevention plan with the patient and family.  Promote use of personal vision and auditory aids.  Promote reorientation, appropriate sensory stimulation, and routines to decrease risk of fall when changes in mental status are present.  Assess assistance level required for safe and effective self-care; consider referral for home care.  Encourage physical activity, such as performance of self-care at highest level of ability, strength and balance exercise program, and provision of appropriate assistive devices; refer to rehabilitation therapy.  Refer to community-based fall prevention program where available.  If fall occurs, determine the cause and revise fall injury prevention plan.  Regularly review medication contribution to fall risk; consider risk related to polypharmacy and age.  Refer to pharmacist for consultation when concerns about medications are revealed.  Balance adequate pain management with potential for oversedation.  Provide guidance related to environmental modifications.  Consider supplementation with Vitamin D .   Notes:      Follow up with Provider as scheduled   On track    General - Client will not be readmitted within 30 days ([facility])   On track      Depression Screen    02/24/2024   10:13 AM 12/09/2023    2:08 PM 10/13/2023  1:22 PM 03/26/2023    2:23 PM 01/15/2023   11:10 AM 01/01/2023   10:45 AM 12/01/2022   12:13 PM  PHQ 2/9 Scores  PHQ - 2 Score 0 0 0 0 0 0 0  PHQ- 9 Score  0  0 0 0 0    Fall Risk    02/24/2024   10:12 AM 12/09/2023    2:07 PM 10/13/2023    1:22 PM 03/26/2023    2:23 PM 01/15/2023   11:10 AM  Fall Risk   Falls in the past year? 0 0 0 0 0  Number falls in past yr: 0 0 0 0 0  Injury with Fall? 0 0 0 0 0  Risk for fall due to : Impaired mobility;Impaired balance/gait  Impaired balance/gait;Impaired mobility Impaired balance/gait;Impaired mobility Impaired balance/gait;Impaired mobility Impaired mobility;Impaired balance/gait    MEDICARE RISK AT HOME: Medicare Risk at Home Any stairs in or around the home?: Yes If so, are there any without handrails?: No Home free of loose throw rugs in walkways, pet beds, electrical cords, etc?: Yes Adequate lighting in your home to reduce risk of falls?: Yes Life alert?: No Use of a cane, walker or w/c?: Yes Grab bars in the bathroom?: Yes Shower chair or bench in shower?: Yes Elevated toilet seat or a handicapped toilet?: Yes  TIMED UP AND GO:  Was the test performed?  No    Cognitive Function:    02/24/2024   10:13 AM 01/15/2023   11:11 AM 01/09/2022    1:25 PM 01/08/2021    1:49 PM  MMSE - Mini Mental State Exam  Not completed: Unable to complete Unable to complete Unable to complete Refused        01/08/2021    1:46 PM 02/18/2018   10:48 AM  6CIT Screen  What Year? 0 points 4 points  What month? 0 points 3 points  What time? 0 points 3 points  Count back from 20 2 points 4 points  Months in reverse 4 points 4 points  Repeat phrase 4 points 10 points  Total Score 10 points 28 points    Immunizations Immunization History  Administered Date(s) Administered   Fluad Quad(high Dose 65+) 08/04/2023   Hepatitis B, ADULT 09/30/2016, 10/28/2016, 12/02/2016, 03/28/2017, 07/02/2017, 08/13/2017, 09/12/2017, 01/14/2018   Influenza,inj,Quad PF,6+ Mos 12/26/2013, 07/09/2015, 07/31/2021   Influenza,inj,Quad PF,6-35 Mos 08/08/2019   Influenza-Unspecified 07/28/2016, 07/31/2017, 07/29/2018, 08/08/2019, 07/20/2020, 08/03/2020, 07/29/2022   Moderna Covid-19 Vaccine  Bivalent Booster 32yrs & up 08/20/2021   Moderna SARS-COV2 Booster Vaccination 02/06/2021   Moderna Sars-Covid-2 Vaccination 11/02/2019, 11/30/2019, 08/30/2020, 08/20/2022, 08/17/2023   PNEUMOCOCCAL CONJUGATE-20 08/02/2021   PPD Test 03/07/2015,  08/08/2016, 05/15/2017, 08/31/2018, 10/10/2019, 10/10/2020, 09/12/2021   Pneumococcal Conjugate-13 12/03/2016   Pneumococcal Polysaccharide-23 03/04/2015, 07/31/2017   Pneumococcal-Unspecified 06/12/2015   RSV,unspecified 09/29/2022   Tdap 08/22/2017   Unspecified SARS-COV-2 Vaccination 11/02/2019   Zoster Recombinant(Shingrix) 05/30/2015, 09/27/2021    TDAP status: Up to date  Flu Vaccine status: Up to date  Pneumococcal vaccine status: Up to date  Covid-19 vaccine status: Completed vaccines  Qualifies for Shingles Vaccine? No   Zostavax completed Yes     Screening Tests Health Maintenance  Topic Date Due   Medicare Annual Wellness (AWV)  01/15/2024   COVID-19 Vaccine (8 - 2024-25 season) 10/12/2024 (Originally 10/12/2023)   INFLUENZA VACCINE  05/27/2024   HEMOGLOBIN A1C  06/15/2024   FOOT EXAM  08/03/2024   OPHTHALMOLOGY EXAM  10/11/2024   Colonoscopy  04/28/2027   DTaP/Tdap/Td (2 -  Td or Tdap) 08/23/2027   Pneumonia Vaccine 79+ Years old  Completed   Hepatitis C Screening  Completed   Zoster Vaccines- Shingrix  Completed   HPV VACCINES  Aged Out   Meningococcal B Vaccine  Aged Out    Health Maintenance  Health Maintenance Due  Topic Date Due   Medicare Annual Wellness (AWV)  01/15/2024    Colorectal cancer screening: No longer required.   Lung Cancer Screening: (Low Dose CT Chest recommended if Age 23-80 years, 20 pack-year currently smoking OR have quit w/in 15years.) does not qualify.   Lung Cancer Screening Referral:   Additional Screening:  Hepatitis C Screening: does not qualify; Completed   Vision Screening: Recommended annual ophthalmology exams for early detection of glaucoma and other disorders of the eye. Is the patient up to date with their annual eye exam?  No  Who is the provider or what is the name of the office in which the patient attends annual eye exams?  If pt is not established with a provider, would they like to be referred to a  provider to establish care? No .   Dental Screening: Recommended annual dental exams for proper oral hygiene  Diabetic Foot Exam: Diabetic Foot Exam: Completed 08-04-2023  Community Resource Referral / Chronic Care Management: CRR required this visit?  No   CCM required this visit?  No     Plan:     I have personally reviewed and noted the following in the patient's chart:   Medical and social history Use of alcohol, tobacco or illicit drugs  Current medications and supplements including opioid prescriptions. Patient is not currently taking opioid prescriptions. Functional ability and status Nutritional status Physical activity Advanced directives List of other physicians Hospitalizations, surgeries, and ER visits in previous 12 months Vitals Screenings to include cognitive, depression, and falls Referrals and appointments  In addition, I have reviewed and discussed with patient certain preventive protocols, quality metrics, and best practice recommendations. A written personalized care plan for preventive services as well as general preventive health recommendations were provided to patient.     Marilyne Shu, NP   02/24/2024   After Visit Summary: (In Person-Declined) Patient declined AVS at this time.  Nurse Notes: this exam was performed by myself at this facility

## 2024-02-24 NOTE — Patient Instructions (Signed)
  Zachary [name] , Thank you for taking time to come for your Medicare Wellness Visit. I appreciate your ongoing commitment to your health goals. Please review the following plan we discussed and let me know if I can assist you in the future.   These are the goals we discussed:  Goals      Absence of Fall and Fall-Related Injury     Evidence-based guidance:  Assess fall risk using a validated tool when available. Consider balance and gait impairment, muscle weakness, diminished vision or hearing, environmental hazards, presence of urinary or bowel urgency and/or incontinence.  Communicate fall injury risk to interprofessional healthcare team.  Develop a fall prevention plan with the patient and family.  Promote use of personal vision and auditory aids.  Promote reorientation, appropriate sensory stimulation, and routines to decrease risk of fall when changes in mental status are present.  Assess assistance level required for safe and effective self-care; consider referral for home care.  Encourage physical activity, such as performance of self-care at highest level of ability, strength and balance exercise program, and provision of appropriate assistive devices; refer to rehabilitation therapy.  Refer to community-based fall prevention program where available.  If fall occurs, determine the cause and revise fall injury prevention plan.  Regularly review medication contribution to fall risk; consider risk related to polypharmacy and age.  Refer to pharmacist for consultation when concerns about medications are revealed.  Balance adequate pain management with potential for oversedation.  Provide guidance related to environmental modifications.  Consider supplementation with Vitamin D .   Notes:      Follow up with Provider as scheduled     General - Client will not be readmitted within 30 days (C-SNP)        This is a list of the screening recommended for you and due dates:  Health Maintenance   Topic Date Due   COVID-19 Vaccine (8 - 2024-25 season) 10/12/2024*   Flu Shot  05/27/2024   Hemoglobin A1C  06/15/2024   Complete foot exam   08/03/2024   Eye exam for diabetics  10/11/2024   Medicare Annual Wellness Visit  02/23/2025   Colon Cancer Screening  04/28/2027   DTaP/Tdap/Td vaccine (2 - Td or Tdap) 08/23/2027   Pneumonia Vaccine  Completed   Hepatitis C Screening  Completed   Zoster (Shingles) Vaccine  Completed   HPV Vaccine  Aged Out   Meningitis B Vaccine  Aged Out  *Topic was postponed. The date shown is not the original due date.

## 2024-02-25 DIAGNOSIS — D509 Iron deficiency anemia, unspecified: Secondary | ICD-10-CM | POA: Diagnosis not present

## 2024-02-25 DIAGNOSIS — Z992 Dependence on renal dialysis: Secondary | ICD-10-CM | POA: Diagnosis not present

## 2024-02-25 DIAGNOSIS — N2581 Secondary hyperparathyroidism of renal origin: Secondary | ICD-10-CM | POA: Diagnosis not present

## 2024-02-25 DIAGNOSIS — N186 End stage renal disease: Secondary | ICD-10-CM | POA: Diagnosis not present

## 2024-02-25 DIAGNOSIS — N250 Renal osteodystrophy: Secondary | ICD-10-CM | POA: Diagnosis not present

## 2024-02-25 DIAGNOSIS — D631 Anemia in chronic kidney disease: Secondary | ICD-10-CM | POA: Diagnosis not present

## 2024-02-26 ENCOUNTER — Encounter: Payer: Self-pay | Admitting: Adult Health

## 2024-02-26 ENCOUNTER — Other Ambulatory Visit (HOSPITAL_COMMUNITY)
Admission: RE | Admit: 2024-02-26 | Discharge: 2024-02-26 | Disposition: A | Discharge status: Home / Self Care | Source: Skilled Nursing Facility | Attending: Adult Health | Admitting: Adult Health

## 2024-02-26 ENCOUNTER — Non-Acute Institutional Stay (SKILLED_NURSING_FACILITY): Payer: Self-pay | Admitting: Adult Health

## 2024-02-26 DIAGNOSIS — D631 Anemia in chronic kidney disease: Secondary | ICD-10-CM | POA: Diagnosis not present

## 2024-02-26 DIAGNOSIS — N186 End stage renal disease: Secondary | ICD-10-CM | POA: Diagnosis not present

## 2024-02-26 DIAGNOSIS — Z992 Dependence on renal dialysis: Secondary | ICD-10-CM | POA: Diagnosis not present

## 2024-02-26 DIAGNOSIS — Z794 Long term (current) use of insulin: Secondary | ICD-10-CM | POA: Diagnosis not present

## 2024-02-26 DIAGNOSIS — E039 Hypothyroidism, unspecified: Secondary | ICD-10-CM | POA: Diagnosis not present

## 2024-02-26 DIAGNOSIS — E1122 Type 2 diabetes mellitus with diabetic chronic kidney disease: Secondary | ICD-10-CM

## 2024-02-26 DIAGNOSIS — I120 Hypertensive chronic kidney disease with stage 5 chronic kidney disease or end stage renal disease: Secondary | ICD-10-CM | POA: Diagnosis not present

## 2024-02-26 LAB — TSH: TSH: 2.642 u[IU]/mL (ref 0.350–4.500)

## 2024-02-26 LAB — VITAMIN B12: Vitamin B-12: 2246 pg/mL — ABNORMAL HIGH (ref 180–914)

## 2024-02-26 NOTE — Progress Notes (Signed)
 Location:  [facility] Room Number: 113 Place of Service:  SNF (31)   CODE STATUS: full   No Known Allergies  Chief Complaint  Patient presents with   Acute Visit    Care plan meeting     HPI:  We have come together for his care plan meeting. BIMS 7/15 mood 0/30. He uses wheelchair without falls. He is independent with his adl care. He is occasionally incontinent of bladder and frequently incontinent of bowel. Dietary: regular diet good appetite feeds self; weight is 207.2 pounds. Therapy: none at this time. 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  Past Medical History:  Diagnosis Date   Abnormal CT scan, kidney 10/06/2011   Acute pyelonephritis 10/07/2011   Anemia    normocytic   Anxiety    mental retardation   Bladder wall thickening 10/06/2011   BPH (benign prostatic hypertrophy)    Diabetes mellitus    Dialysis patient (HCC)    Tuesday, Thursday and Saturday,    DVT of leg (deep venous thrombosis) (HCC) 12/25/2016   Edema     history of lower extremity edema   GERD (gastroesophageal reflux disease)    Heme positive stool    Hydronephrosis    Hyperkalemia    Hyperlipidemia    Hypernatremia    Hypertension    Hypothyroidism    Impaired speech    Infected prosthetic vascular graft (HCC)    MR (mental retardation)    Muscle weakness    Obstructive uropathy    Perinephric abscess 10/07/2011   Poor historian poor historian   Primary colorectal adenocarcinoma (HCC) 04/2017   S/p total colectomy   Protein calorie malnutrition (HCC)    Pyelonephritis    Renal failure (ARF), acute on chronic (HCC)    Renal insufficiency    chronic history   Sepsis (HCC)    Smoking    Uremia    Urinary retention    UTI (lower urinary tract infection)  10/06/2011    Past Surgical History:  Procedure Laterality Date   A/V FISTULAGRAM N/A 08/13/2018   Procedure: A/V FISTULAGRAM - Right Upper;  Surgeon: Richrd Char, MD;  Location: [facility] INVASIVE CV LAB;  Service: Cardiovascular;  Laterality: N/A;   A/V FISTULAGRAM N/A 11/22/2018   Procedure: A/V FISTULAGRAM - Right Upper;  Surgeon: Adine Hoof, MD;  Location: [facility] INVASIVE CV LAB;  Service: Cardiovascular;  Laterality: N/A;   A/V FISTULAGRAM Left 10/08/2022   Procedure: A/V Fistulagram;  Surgeon: Kayla Part, MD;  Location: [facility] INVASIVE CV LAB;  Service: Cardiovascular;  Laterality: Left;   A/V FISTULAGRAM Left 05/06/2023   Procedure: A/V Fistulagram;  Surgeon: Carlene Che, MD;  Location: [facility] INVASIVE CV LAB;  Service: Cardiovascular;  Laterality: Left;   AV FISTULA PLACEMENT Left 07/06/2015   Procedure:  INSERTION LEFT ARM ARTERIOVENOUS GORTEX GRAFT;  Surgeon: Dannis Dy, MD;  Location: [facility] OR;  Service: Vascular;  Laterality: Left;   AV FISTULA PLACEMENT Right 02/26/2016   Procedure: ARTERIOVENOUS (AV) FISTULA CREATION ;  Surgeon: Dannis Dy, MD;  Location: [facility] OR;  Service: Vascular;  Laterality: Right;   AV FISTULA PLACEMENT Right 11/25/2018   Procedure: INSERTION OF ARTERIOVENOUS (AV) ARTEGRAFT RIGHT UPPER ARM;  Surgeon: Adine Hoof, MD;  Location: [facility] OR;  Service: Vascular;  Laterality: Right;   AV FISTULA PLACEMENT Left 05/28/2021   Procedure: LEFT ARM ARTERIOVENOUS (AV) FISTULA;  Surgeon: Mayo Speck, MD;  Location: [facility] ORS;  Service: Vascular;  Laterality: Left;   AVGG REMOVAL Left 10/09/2015   Procedure: REMOVAL OF ARTERIOVENOUS GORETEX GRAFT (AVGG) Evacuation of Lymphocele, Vein Patch angioplasty of brachial artery.;  Surgeon: Dannis Dy, MD;  Location: [facility] OR;  Service: Vascular;  Laterality: Left;   BASCILIC VEIN TRANSPOSITION Right 02/26/2016   Procedure: Right BASCILIC VEIN TRANSPOSITION;  Surgeon: Dannis Dy, MD;  Location: [facility] OR;  Service: Vascular;  Laterality: Right;   BASCILIC VEIN TRANSPOSITION Left 07/30/2021   Procedure: LEFT ARM SECOND STAGE BASILIC VEIN TRANSPOSITION;  Surgeon: Mayo Speck, MD;  Location: [facility] ORS;  Service: Vascular;  Laterality: Left;   CIRCUMCISION N/A 01/04/2014   Procedure: CIRCUMCISION ADULT (procedure #1);  Surgeon: Mohammad I Javaid, MD;  Location: [facility] ORS;  Service: Urology;  Laterality: N/A;   COLECTOMY N/A 05/04/2017   Procedure: TOTAL COLECTOMY;  Surgeon: Alanda Allegra, MD;  Location: [facility] ORS;  Service: General;  Laterality: N/A;   COLONOSCOPY N/A 04/27/2017   Surgeon: Suzette Espy, MD; annular mass in the ascending colon likely representing cancer biopsied, multiple 6-22 mm polyps removed, clean rectum.  Pathology with multiple tubular adenomas, high-grade dysplasia noted in ascending colon and splenic flexure.   CYSTOSCOPY W/ RETROGRADES Bilateral 06/29/2015   Procedure: CYSTOSCOPY, DILATION OF URETHRAL STRICTURE WITH BILATERAL RETROGRADE PYELOGRAM,SUPRAPUBIC TUBE CHANGE;  Surgeon: Christina Coyer, MD;  Location: [facility] ORS;  Service: Urology;  Laterality: Bilateral;   CYSTOSCOPY WITH URETHRAL DILATATION N/A 12/29/2013   Procedure: CYSTOSCOPY WITH URETHRAL DILATATION;  Surgeon: Reggie Caper, MD;  Location: [facility]S;  Service: Urology;  Laterality: N/A;   ESOPHAGOGASTRODUODENOSCOPY N/A 04/27/2017   Procedure: ESOPHAGOGASTRODUODENOSCOPY (EGD);  Surgeon: Suzette Espy, MD;  [facility];  Service: Endoscopy;  Laterality: N/A;   FLEXIBLE SIGMOIDOSCOPY N/A 12/02/2021   Procedure: FLEXIBLE SIGMOIDOSCOPY;  Surgeon: Vinetta Greening, DO;  Location: [facility];  Service: Endoscopy;  Laterality: N/A;  10:30am, dialysis patient   INSERTION OF DIALYSIS CATHETER Right 11/25/2018   Procedure: INSERTION OF DIALYSIS CATHETER RIGHT INTERNAL JUGULAR;  Surgeon: Adine Hoof, MD;  Location: [facility] OR;  Service: Vascular;  Laterality: Right;   IR AV  DIALY SHUNT INTRO NEEDLE/INTRAC INITIAL W/PTA/STENT/IMG LT Left 02/16/2024   IR AV DIALY SHUNT INTRO NEEDLE/INTRACATH INITIAL W/PTA/IMG RIGHT Right 09/07/2018   IR FLUORO GUIDE CV LINE RIGHT  10/16/2020   IR FLUORO GUIDE CV LINE RIGHT  10/03/2022   IR REMOVAL TUN CV CATH W/O FL  01/12/2019   IR THROMBECTOMY AV FISTULA W/THROMBOLYSIS/PTA INC/SHUNT/IMG RIGHT Right 04/26/2018   IR US  GUIDE VASC ACCESS LEFT  02/17/2024   IR US  GUIDE VASC ACCESS RIGHT  04/26/2018   IR US  GUIDE VASC ACCESS RIGHT  09/07/2018   IR US  GUIDE VASC ACCESS RIGHT  10/16/2020   IR US  GUIDE VASC ACCESS RIGHT  10/16/2020   IR US  GUIDE VASC ACCESS RIGHT  10/03/2022   ORIF FEMUR FRACTURE Right 11/22/2016   Procedure: OPEN REDUCTION INTERNAL FIXATION (ORIF) DISTAL FEMUR FRACTURE;  Surgeon: Adonica Hoose, MD;  Location: [facility] OR;  Service: Orthopedics;  Laterality: Right;   PATCH ANGIOPLASTY Right 12/10/2017   Procedure: PATCH ANGIOPLASTY;  Surgeon: Dannis Dy, MD;  Location: [facility] OR;  Service: Vascular;  Laterality: Right;   PERIPHERAL VASCULAR BALLOON ANGIOPLASTY  08/13/2018   Procedure: PERIPHERAL VASCULAR BALLOON  ANGIOPLASTY;  Surgeon: Richrd Char, MD;  Location: [facility] INVASIVE CV LAB;  Service: Cardiovascular;;  right AV fistula    PERIPHERAL VASCULAR BALLOON ANGIOPLASTY  11/22/2018   Procedure: PERIPHERAL VASCULAR BALLOON ANGIOPLASTY;  Surgeon: Adine Hoof, MD;  Location: [facility] INVASIVE CV LAB;  Service: Cardiovascular;;  rt AV fistula   PERIPHERAL VASCULAR BALLOON ANGIOPLASTY  10/08/2022   Procedure: PERIPHERAL VASCULAR BALLOON ANGIOPLASTY;  Surgeon: Kayla Part, MD;  Location: [facility] INVASIVE CV LAB;  Service: Cardiovascular;;  arterial anastamosis   PERIPHERAL VASCULAR CATHETERIZATION N/A 10/08/2015   Procedure: A/V Shuntogram;  Surgeon: Dannis Dy, MD;  Location: [facility] INVASIVE CV LAB;  Service: Cardiovascular;  Laterality: N/A;   PERIPHERAL VASCULAR INTERVENTION Left 10/08/2022   Procedure:  PERIPHERAL VASCULAR INTERVENTION;  Surgeon: Kayla Part, MD;  Location: [facility] INVASIVE CV LAB;  Service: Cardiovascular;  Laterality: Left;  left AVF   REMOVAL OF A DIALYSIS CATHETER N/A 12/03/2021   Procedure: MINOR REMOVAL OF A TUNNELED DIALYSIS CATHETER;  Surgeon: Mayo Speck, MD;  Location: [facility];  Service: Vascular;  Laterality: N/A;   REMOVAL OF A DIALYSIS CATHETER N/A 11/18/2022   Procedure: MINOR REMOVAL OF A TUNNELED DIALYSIS CATHETER;  Surgeon: Mayo Speck, MD;  Location: [facility];  Service: Vascular;  Laterality: N/A;   THROMBECTOMY W/ EMBOLECTOMY Right 12/10/2017   Procedure: THROMBECTOMY REVISION RIGHT ARM  ARTERIOVENOUS FISTULA;  Surgeon: Dannis Dy, MD;  Location: [facility] OR;  Service: Vascular;  Laterality: Right;   TRANSURETHRAL RESECTION OF PROSTATE N/A 01/04/2014   Procedure: TRANSURETHRAL RESECTION OF THE PROSTATE (TURP) (procedure #2);  Surgeon: Mohammad I Javaid, MD;  Location: [facility];  Service: Urology;  Laterality: N/A;    Social History   Socioeconomic History   Marital status: Single    Spouse name: Not on file   Number of children: Not on file   Years of education: Not on file   Highest education level: Not on file  Occupational History   Occupation: retired   Tobacco Use   Smoking status: Never   Smokeless tobacco: Never  Vaping Use   Vaping status: Never Used  Substance and Sexual Activity   Alcohol use: No   Drug use: No   Sexual activity: Not Currently  Other Topics Concern   Not on file  Social History Narrative   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   [facility] resident [facility]    Social Drivers of Health   Financial Resource Strain: Low Risk  (02/18/2018)   Overall Financial Resource Strain (CARDIA)    Difficulty of Paying Living Expenses: Not hard at all  Food Insecurity: No Food Insecurity (02/17/2024)   Hunger Vital Sign    Worried About Running Out of Food in the Last Year: Never true    Ran  Out of Food in the Last Year: Never true  Transportation Needs: No Transportation Needs (02/17/2024)   PRAPARE - Administrator, Civil Service (Medical): No    Lack of Transportation (Non-Medical): No  Physical Activity: Unknown (09/15/2019)   Exercise Vital Sign    Days of Exercise per Week: Not on file    Minutes of Exercise per Session: 0 min  Stress: No Stress Concern Present (02/18/2018)   [employer] of Occupational Health - Occupational Stress Questionnaire    Feeling of Stress : Not at all  Social Connections: Socially Isolated (02/17/2024)   Social Connection and Isolation Panel [NHANES]    Frequency of  Communication with Friends and Family: Three times a week    Frequency of Social Gatherings with Friends and Family: Once a week    Attends Religious Services: Never    Database administrator or Organizations: No    Attends Banker Meetings: Never    Marital Status: Never married  Intimate Partner Violence: Not At Risk (02/17/2024)   Humiliation, Afraid, Rape, and Kick questionnaire    Fear of Current or Ex-Partner: No    Emotionally Abused: No    Physically Abused: No    Sexually Abused: No   Family History  Problem Relation Age of Onset   Cancer Mother    Colon cancer Neg Hx       VITAL SIGNS BP 116/66   Pulse 88   Temp 98 F (36.7 C)   Resp 18   Ht 5\' 9"  (1.753 m)   Wt 207 lb 3.2 oz (94 kg)   SpO2 98%   BMI 30.60 kg/m   Outpatient Encounter Medications as of 02/26/2024  Medication Sig Note   aspirin  81 MG chewable tablet Chew 81 mg by mouth daily.    insulin  glargine-yfgn (SEMGLEE , YFGN,) 100 UNIT/ML Pen Inject 8 Units into the skin at bedtime.    levothyroxine  (SYNTHROID ) 150 MCG tablet Take 150 mcg by mouth daily before breakfast.    midodrine  (PROAMATINE ) 10 MG tablet Take 10 mg by mouth See admin instructions. Send medication with resident on Tues/Thurs/Sat to dialysis. Do not administer at [facility], give to resident to  take with him to dialysis. Send #3 tablets of 10mg  and #3 tablets of 5mg  to equal 15mg  per dose. 02/17/2024: Confirmed per [facility] - pt only receives while at dialysis on Tues/Thurs/Sat. Pt receives 2 strengths of midodrine  to equal 15mg  per dose.   midodrine  (PROAMATINE ) 5 MG tablet Take 5 mg by mouth See admin instructions. Send medication with resident on Tues/Thurs/Sat to dialysis. Do not administer at [facility], give to resident to take with him to dialysis. Send #3 tablets of 5mg  and #3 tablets of 10mg  to equal 15mg  per dose.    Omega-3 Fatty Acids (OMEGA-3 FISH OIL PO) Take 2 capsules by mouth at bedtime. Omega 3 - DHA - EPA - Fish oil 300-1000mg  capsules    rosuvastatin  (CRESTOR ) 20 MG tablet Take 20 mg by mouth at bedtime.    sevelamer  carbonate (RENVELA ) 800 MG tablet Take 3 tablets (2,400 mg total) by mouth 3 (three) times daily with meals.    tamsulosin  (FLOMAX ) 0.4 MG CAPS capsule Take 0.4 mg by mouth at bedtime. Give 30 minutes after a meal. Do not crush or chew    torsemide  (DEMADEX ) 20 MG tablet Take 20 mg by mouth See admin instructions. Give 1 tablet (20mg ) by mouth once a day in the afternoon on Sunday, Monday, Wednesday, Friday.    vitamin B-12 (CYANOCOBALAMIN ) 1000 MCG tablet Take 1,000 mcg by mouth at bedtime.    No facility-administered encounter medications on file as of 02/26/2024.     SIGNIFICANT DIAGNOSTIC EXAMS  LABS REVIEWED PREVIOUS;      02-13-23: wbc 3.8; hgb 9.7; hct 28.7; mcv 101.1 plt 126; glucose 67; bun 18; creat 6.28; k+ 2.9; na++ 137; ca 8.8; gfr 9; protein 6.9; albumin  3.2 mag 1.8; occult blood: neg 02-23-23: hgb 9.8; hct 29.7 glucose 110; bun 24; creat 8.32 k+ 3.6; na++ 139; ca 9.6 gfr 6  06-11-23: wbc 4.4; hgb 11.7; hct 35.2; mcv 102.3 plt 88; glucose 160; bun 20;  creat 4.71; k+ 3.5; na++ 137; ca 8.4; gfr 12   06-13-23: wbc 3.6; hgb 13.0; hct 39.2; mcv 102.6 plt 110; glucose 103; bun 11; creat 4.13; k+ 3.3; na++ 138; ca 8.6; gfr 14; d-dimer 0.54; CRP <0.5   06-16-23: glucose 99; bun 40; creat 10.88; k+ 4.6; na++ 136; ca 8.8; gfr 5 07-16-23: hgb A1c 5.7; chol 134; ldl 39; trig 486; hdl 24 09-03-23: tsh 3.852 free t4: 0.82 09-14-23: protein 6.9 albumin  3.2; chol 127; ldl 37 trig 420 hdl 25  TODAY  12-17-23: wbc 3.9; hgb 10.6; hct 30.5; mcv 103.7 plt 110; hgb A1c 6.6; PSA 8.02; chol 118; ldl 45; trig 222; hdl 29; tsh 10.663 02-16-24: wbc 4.3; hgb 9.3; hct 27.9; mcv 104.5 plt 81; glucose 138; bun 80; creat 17.85; k+ 5.8; na++ 134; ca 9.3 gfr 2; protein 7.3 albumin  3.4 02-18-24: wbc 3.9; hgb 8.7; hct 25.5; mcv 102.4 plt 78; glucose 133; bun 52; creat 13.86; k+ 4.7; na++ 137; ca 8.7; gfr 3; protein 6.6 albumin  3.1 mag 2.5 phos 6.9  Review of Systems  Constitutional:  Negative for malaise/fatigue.  Respiratory:  Negative for cough and shortness of breath.   Cardiovascular:  Negative for chest pain, palpitations and leg swelling.  Gastrointestinal:  Negative for abdominal pain, constipation and heartburn.  Musculoskeletal:  Negative for back pain, joint pain and myalgias.  Skin: Negative.   Neurological:  Negative for dizziness.  Psychiatric/Behavioral:  The patient is not nervous/anxious.    Physical Exam Constitutional:      General: He is not in acute distress.    Appearance: He is well-developed. He is not diaphoretic.  Neck:     Thyroid : No thyromegaly.  Cardiovascular:     Rate and Rhythm: Normal rate and regular rhythm.     Pulses: Normal pulses.     Heart sounds: Normal heart sounds.  Pulmonary:     Effort: Pulmonary effort is normal. No respiratory distress.     Breath sounds: Normal breath sounds.  Abdominal:     General: Bowel sounds are normal. There is no distension.     Palpations: Abdomen is soft.     Tenderness: There is no abdominal tenderness.  Musculoskeletal:        General: Normal range of motion.     Cervical back: Neck supple.     Right lower leg: No edema.     Left lower leg: No edema.  Lymphadenopathy:      Cervical: No cervical adenopathy.  Skin:    General: Skin is warm and dry.     Comments: Left arm fistula +thrill/bruit  Neurological:     Mental Status: He is alert. Mental status is at baseline.  Psychiatric:        Mood and Affect: Mood normal.      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    Britt Candle NP [facility]   call [phone]

## 2024-02-27 DIAGNOSIS — Z992 Dependence on renal dialysis: Secondary | ICD-10-CM | POA: Diagnosis not present

## 2024-02-27 DIAGNOSIS — N186 End stage renal disease: Secondary | ICD-10-CM | POA: Diagnosis not present

## 2024-02-27 DIAGNOSIS — N2581 Secondary hyperparathyroidism of renal origin: Secondary | ICD-10-CM | POA: Diagnosis not present

## 2024-02-27 DIAGNOSIS — D509 Iron deficiency anemia, unspecified: Secondary | ICD-10-CM | POA: Diagnosis not present

## 2024-02-27 DIAGNOSIS — N250 Renal osteodystrophy: Secondary | ICD-10-CM | POA: Diagnosis not present

## 2024-02-27 DIAGNOSIS — D631 Anemia in chronic kidney disease: Secondary | ICD-10-CM | POA: Diagnosis not present

## 2024-03-01 DIAGNOSIS — D509 Iron deficiency anemia, unspecified: Secondary | ICD-10-CM | POA: Diagnosis not present

## 2024-03-01 DIAGNOSIS — D631 Anemia in chronic kidney disease: Secondary | ICD-10-CM | POA: Diagnosis not present

## 2024-03-01 DIAGNOSIS — N186 End stage renal disease: Secondary | ICD-10-CM | POA: Diagnosis not present

## 2024-03-01 DIAGNOSIS — N2581 Secondary hyperparathyroidism of renal origin: Secondary | ICD-10-CM | POA: Diagnosis not present

## 2024-03-01 DIAGNOSIS — Z992 Dependence on renal dialysis: Secondary | ICD-10-CM | POA: Diagnosis not present

## 2024-03-01 DIAGNOSIS — N250 Renal osteodystrophy: Secondary | ICD-10-CM | POA: Diagnosis not present

## 2024-03-03 DIAGNOSIS — N186 End stage renal disease: Secondary | ICD-10-CM | POA: Diagnosis not present

## 2024-03-03 DIAGNOSIS — D631 Anemia in chronic kidney disease: Secondary | ICD-10-CM | POA: Diagnosis not present

## 2024-03-03 DIAGNOSIS — D509 Iron deficiency anemia, unspecified: Secondary | ICD-10-CM | POA: Diagnosis not present

## 2024-03-03 DIAGNOSIS — N2581 Secondary hyperparathyroidism of renal origin: Secondary | ICD-10-CM | POA: Diagnosis not present

## 2024-03-03 DIAGNOSIS — Z992 Dependence on renal dialysis: Secondary | ICD-10-CM | POA: Diagnosis not present

## 2024-03-03 DIAGNOSIS — N250 Renal osteodystrophy: Secondary | ICD-10-CM | POA: Diagnosis not present

## 2024-03-05 DIAGNOSIS — N250 Renal osteodystrophy: Secondary | ICD-10-CM | POA: Diagnosis not present

## 2024-03-05 DIAGNOSIS — Z992 Dependence on renal dialysis: Secondary | ICD-10-CM | POA: Diagnosis not present

## 2024-03-05 DIAGNOSIS — N2581 Secondary hyperparathyroidism of renal origin: Secondary | ICD-10-CM | POA: Diagnosis not present

## 2024-03-05 DIAGNOSIS — N186 End stage renal disease: Secondary | ICD-10-CM | POA: Diagnosis not present

## 2024-03-05 DIAGNOSIS — D509 Iron deficiency anemia, unspecified: Secondary | ICD-10-CM | POA: Diagnosis not present

## 2024-03-05 DIAGNOSIS — D631 Anemia in chronic kidney disease: Secondary | ICD-10-CM | POA: Diagnosis not present

## 2024-03-08 DIAGNOSIS — N186 End stage renal disease: Secondary | ICD-10-CM | POA: Diagnosis not present

## 2024-03-08 DIAGNOSIS — N2581 Secondary hyperparathyroidism of renal origin: Secondary | ICD-10-CM | POA: Diagnosis not present

## 2024-03-08 DIAGNOSIS — Z992 Dependence on renal dialysis: Secondary | ICD-10-CM | POA: Diagnosis not present

## 2024-03-10 DIAGNOSIS — D509 Iron deficiency anemia, unspecified: Secondary | ICD-10-CM | POA: Diagnosis not present

## 2024-03-10 DIAGNOSIS — D631 Anemia in chronic kidney disease: Secondary | ICD-10-CM | POA: Diagnosis not present

## 2024-03-10 DIAGNOSIS — N250 Renal osteodystrophy: Secondary | ICD-10-CM | POA: Diagnosis not present

## 2024-03-10 DIAGNOSIS — N2581 Secondary hyperparathyroidism of renal origin: Secondary | ICD-10-CM | POA: Diagnosis not present

## 2024-03-10 DIAGNOSIS — Z992 Dependence on renal dialysis: Secondary | ICD-10-CM | POA: Diagnosis not present

## 2024-03-10 DIAGNOSIS — N186 End stage renal disease: Secondary | ICD-10-CM | POA: Diagnosis not present

## 2024-03-12 DIAGNOSIS — N250 Renal osteodystrophy: Secondary | ICD-10-CM | POA: Diagnosis not present

## 2024-03-12 DIAGNOSIS — Z992 Dependence on renal dialysis: Secondary | ICD-10-CM | POA: Diagnosis not present

## 2024-03-12 DIAGNOSIS — N186 End stage renal disease: Secondary | ICD-10-CM | POA: Diagnosis not present

## 2024-03-12 DIAGNOSIS — D509 Iron deficiency anemia, unspecified: Secondary | ICD-10-CM | POA: Diagnosis not present

## 2024-03-12 DIAGNOSIS — D631 Anemia in chronic kidney disease: Secondary | ICD-10-CM | POA: Diagnosis not present

## 2024-03-12 DIAGNOSIS — N2581 Secondary hyperparathyroidism of renal origin: Secondary | ICD-10-CM | POA: Diagnosis not present

## 2024-03-15 DIAGNOSIS — D509 Iron deficiency anemia, unspecified: Secondary | ICD-10-CM | POA: Diagnosis not present

## 2024-03-15 DIAGNOSIS — N250 Renal osteodystrophy: Secondary | ICD-10-CM | POA: Diagnosis not present

## 2024-03-15 DIAGNOSIS — N2581 Secondary hyperparathyroidism of renal origin: Secondary | ICD-10-CM | POA: Diagnosis not present

## 2024-03-15 DIAGNOSIS — D631 Anemia in chronic kidney disease: Secondary | ICD-10-CM | POA: Diagnosis not present

## 2024-03-15 DIAGNOSIS — Z992 Dependence on renal dialysis: Secondary | ICD-10-CM | POA: Diagnosis not present

## 2024-03-15 DIAGNOSIS — N186 End stage renal disease: Secondary | ICD-10-CM | POA: Diagnosis not present

## 2024-03-17 DIAGNOSIS — N250 Renal osteodystrophy: Secondary | ICD-10-CM | POA: Diagnosis not present

## 2024-03-17 DIAGNOSIS — F4322 Adjustment disorder with anxiety: Secondary | ICD-10-CM | POA: Diagnosis not present

## 2024-03-17 DIAGNOSIS — F70 Mild intellectual disabilities: Secondary | ICD-10-CM | POA: Diagnosis not present

## 2024-03-17 DIAGNOSIS — D509 Iron deficiency anemia, unspecified: Secondary | ICD-10-CM | POA: Diagnosis not present

## 2024-03-17 DIAGNOSIS — Z992 Dependence on renal dialysis: Secondary | ICD-10-CM | POA: Diagnosis not present

## 2024-03-17 DIAGNOSIS — N186 End stage renal disease: Secondary | ICD-10-CM | POA: Diagnosis not present

## 2024-03-17 DIAGNOSIS — D631 Anemia in chronic kidney disease: Secondary | ICD-10-CM | POA: Diagnosis not present

## 2024-03-17 DIAGNOSIS — N2581 Secondary hyperparathyroidism of renal origin: Secondary | ICD-10-CM | POA: Diagnosis not present

## 2024-03-19 DIAGNOSIS — Z992 Dependence on renal dialysis: Secondary | ICD-10-CM | POA: Diagnosis not present

## 2024-03-19 DIAGNOSIS — D509 Iron deficiency anemia, unspecified: Secondary | ICD-10-CM | POA: Diagnosis not present

## 2024-03-19 DIAGNOSIS — D631 Anemia in chronic kidney disease: Secondary | ICD-10-CM | POA: Diagnosis not present

## 2024-03-19 DIAGNOSIS — N2581 Secondary hyperparathyroidism of renal origin: Secondary | ICD-10-CM | POA: Diagnosis not present

## 2024-03-19 DIAGNOSIS — N186 End stage renal disease: Secondary | ICD-10-CM | POA: Diagnosis not present

## 2024-03-19 DIAGNOSIS — N250 Renal osteodystrophy: Secondary | ICD-10-CM | POA: Diagnosis not present

## 2024-03-22 ENCOUNTER — Non-Acute Institutional Stay (SKILLED_NURSING_FACILITY): Payer: Self-pay | Admitting: Adult Health

## 2024-03-22 ENCOUNTER — Encounter: Payer: Self-pay | Admitting: Adult Health

## 2024-03-22 DIAGNOSIS — N186 End stage renal disease: Secondary | ICD-10-CM | POA: Diagnosis not present

## 2024-03-22 DIAGNOSIS — F321 Major depressive disorder, single episode, moderate: Secondary | ICD-10-CM

## 2024-03-22 DIAGNOSIS — N2581 Secondary hyperparathyroidism of renal origin: Secondary | ICD-10-CM | POA: Diagnosis not present

## 2024-03-22 DIAGNOSIS — D509 Iron deficiency anemia, unspecified: Secondary | ICD-10-CM | POA: Diagnosis not present

## 2024-03-22 DIAGNOSIS — E034 Atrophy of thyroid (acquired): Secondary | ICD-10-CM | POA: Diagnosis not present

## 2024-03-22 DIAGNOSIS — N250 Renal osteodystrophy: Secondary | ICD-10-CM | POA: Diagnosis not present

## 2024-03-22 DIAGNOSIS — D631 Anemia in chronic kidney disease: Secondary | ICD-10-CM

## 2024-03-22 DIAGNOSIS — Z992 Dependence on renal dialysis: Secondary | ICD-10-CM | POA: Diagnosis not present

## 2024-03-22 NOTE — Progress Notes (Signed)
 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 There are no reports of uncontrolled pain. He continues with dialysis three days weekly. He denies any anxiety or depressive thoughts.    Past Medical History:  Diagnosis Date   Abnormal CT scan, kidney 10/06/2011   Acute pyelonephritis 10/07/2011   Anemia    normocytic   Anxiety    mental retardation   Bladder wall thickening 10/06/2011   BPH (benign prostatic hypertrophy)    Diabetes mellitus    Dialysis patient (HCC)    Tuesday, Thursday and Saturday,    DVT of leg (deep venous thrombosis) (HCC) 12/25/2016   Edema     history of lower extremity edema   GERD (gastroesophageal reflux disease)    Heme positive stool    Hydronephrosis    Hyperkalemia    Hyperlipidemia    Hypernatremia    Hypertension    Hypothyroidism    Impaired speech    Infected prosthetic vascular graft (HCC)    MR (mental retardation)    Muscle weakness    Obstructive uropathy    Perinephric abscess 10/07/2011   Poor historian poor historian   Primary colorectal adenocarcinoma (HCC) 04/2017   S/p total colectomy   Protein calorie malnutrition (HCC)    Pyelonephritis    Renal failure (ARF), acute on chronic (HCC)    Renal insufficiency    chronic history   Sepsis (HCC)    Smoking    Uremia    Urinary retention    UTI (lower urinary tract infection) 10/06/2011    Past Surgical History:  Procedure  Laterality Date   A/V FISTULAGRAM N/A 08/13/2018   Procedure: A/V FISTULAGRAM - Right Upper;  Surgeon: Richrd Char, MD;  Location: [facility] INVASIVE CV LAB;  Service: Cardiovascular;  Laterality: N/A;   A/V FISTULAGRAM N/A 11/22/2018   Procedure: A/V FISTULAGRAM - Right Upper;  Surgeon: Adine Hoof, MD;  Location: [facility] INVASIVE CV LAB;  Service: Cardiovascular;  Laterality: N/A;   A/V FISTULAGRAM Left 10/08/2022   Procedure: A/V Fistulagram;  Surgeon: Kayla Part, MD;  Location: [facility] INVASIVE CV LAB;  Service: Cardiovascular;  Laterality: Left;   A/V FISTULAGRAM Left 05/06/2023   Procedure: A/V Fistulagram;  Surgeon: Carlene Che, MD;  Location: [facility] INVASIVE CV LAB;  Service: Cardiovascular;  Laterality: Left;   AV FISTULA PLACEMENT Left 07/06/2015   Procedure:  INSERTION LEFT ARM ARTERIOVENOUS GORTEX GRAFT;  Surgeon: Dannis Dy, MD;  Location: [facility] OR;  Service: Vascular;  Laterality: Left;   AV FISTULA PLACEMENT Right 02/26/2016   Procedure: ARTERIOVENOUS (AV) FISTULA CREATION ;  Surgeon: Dannis Dy, MD;  Location: [facility] OR;  Service: Vascular;  Laterality: Right;   AV FISTULA PLACEMENT Right 11/25/2018   Procedure: INSERTION OF ARTERIOVENOUS (AV) ARTEGRAFT RIGHT UPPER ARM;  Surgeon: Adine Hoof, MD;  Location: [facility] OR;  Service: Vascular;  Laterality: Right;   AV  FISTULA PLACEMENT Left 05/28/2021   Procedure: LEFT ARM ARTERIOVENOUS (AV) FISTULA;  Surgeon: Mayo Speck, MD;  Location: [facility] ORS;  Service: Vascular;  Laterality: Left;   AVGG REMOVAL Left 10/09/2015   Procedure: REMOVAL OF ARTERIOVENOUS GORETEX GRAFT (AVGG) Evacuation of Lymphocele, Vein Patch angioplasty of brachial artery.;  Surgeon: Dannis Dy, MD;  Location: [facility] OR;  Service: Vascular;  Laterality: Left;   BASCILIC VEIN TRANSPOSITION Right 02/26/2016   Procedure: Right BASCILIC VEIN TRANSPOSITION;  Surgeon: Dannis Dy, MD;  Location: [facility] OR;  Service: Vascular;   Laterality: Right;   BASCILIC VEIN TRANSPOSITION Left 07/30/2021   Procedure: LEFT ARM SECOND STAGE BASILIC VEIN TRANSPOSITION;  Surgeon: Mayo Speck, MD;  Location: [facility] ORS;  Service: Vascular;  Laterality: Left;   CIRCUMCISION N/A 01/04/2014   Procedure: CIRCUMCISION ADULT (procedure #1);  Surgeon: Mohammad I Javaid, MD;  Location: [facility] ORS;  Service: Urology;  Laterality: N/A;   COLECTOMY N/A 05/04/2017   Procedure: TOTAL COLECTOMY;  Surgeon: Alanda Allegra, MD;  Location: [facility] ORS;  Service: General;  Laterality: N/A;   COLONOSCOPY N/A 04/27/2017   Surgeon: Suzette Espy, MD; annular mass in the ascending colon likely representing cancer biopsied, multiple 6-22 mm polyps removed, clean rectum.  Pathology with multiple tubular adenomas, high-grade dysplasia noted in ascending colon and splenic flexure.   CYSTOSCOPY W/ RETROGRADES Bilateral 06/29/2015   Procedure: CYSTOSCOPY, DILATION OF URETHRAL STRICTURE WITH BILATERAL RETROGRADE PYELOGRAM,SUPRAPUBIC TUBE CHANGE;  Surgeon: Christina Coyer, MD;  Location: [facility] ORS;  Service: Urology;  Laterality: Bilateral;   CYSTOSCOPY WITH URETHRAL DILATATION N/A 12/29/2013   Procedure: CYSTOSCOPY WITH URETHRAL DILATATION;  Surgeon: Reggie Caper, MD;  Location: [facility] ORS;  Service: Urology;  Laterality: N/A;   ESOPHAGOGASTRODUODENOSCOPY N/A 04/27/2017   Procedure: ESOPHAGOGASTRODUODENOSCOPY (EGD);  Surgeon: Suzette Espy, MD;  [facility]: [facility];  Service: Endoscopy;  Laterality: N/A;   FLEXIBLE SIGMOIDOSCOPY N/A 12/02/2021   Procedure: FLEXIBLE SIGMOIDOSCOPY;  Surgeon: Vinetta Greening, DO;  Location: [facility];  Service: Endoscopy;  Laterality: N/A;  10:30am, dialysis patient   INSERTION OF DIALYSIS CATHETER Right 11/25/2018   Procedure: INSERTION OF DIALYSIS CATHETER RIGHT INTERNAL JUGULAR;  Surgeon: Adine Hoof, MD;  Location: [facility] OR;  Service: Vascular;  Laterality: Right;   IR AV DIALY SHUNT INTRO NEEDLE/INTRAC INITIAL  W/PTA/STENT/IMG LT Left 02/16/2024   IR AV DIALY SHUNT INTRO NEEDLE/INTRACATH INITIAL W/PTA/IMG RIGHT Right 09/07/2018   IR FLUORO GUIDE CV LINE RIGHT  10/16/2020   IR FLUORO GUIDE CV LINE RIGHT  10/03/2022   IR REMOVAL TUN CV CATH W/O FL  01/12/2019   IR THROMBECTOMY AV FISTULA W/THROMBOLYSIS/PTA INC/SHUNT/IMG RIGHT Right 04/26/2018   IR US  GUIDE VASC ACCESS LEFT  02/17/2024   IR US  GUIDE VASC ACCESS RIGHT  04/26/2018   IR US  GUIDE VASC ACCESS RIGHT  09/07/2018   IR US  GUIDE VASC ACCESS RIGHT  10/16/2020   IR US  GUIDE VASC ACCESS RIGHT  10/16/2020   IR US  GUIDE VASC ACCESS RIGHT  10/03/2022   ORIF FEMUR FRACTURE Right 11/22/2016   Procedure: OPEN REDUCTION INTERNAL FIXATION (ORIF) DISTAL FEMUR FRACTURE;  Surgeon: Adonica Hoose, MD;  Location: [facility] OR;  Service: Orthopedics;  Laterality: Right;   PATCH ANGIOPLASTY Right 12/10/2017   Procedure: PATCH ANGIOPLASTY;  Surgeon: Dannis Dy, MD;  Location: [facility] OR;  Service: Vascular;  Laterality: Right;   PERIPHERAL VASCULAR BALLOON ANGIOPLASTY  08/13/2018   Procedure: PERIPHERAL VASCULAR BALLOON ANGIOPLASTY;  Surgeon: Richrd Char, MD;  Location: [facility] INVASIVE CV LAB;  Service: Cardiovascular;;  right AV fistula    PERIPHERAL VASCULAR BALLOON ANGIOPLASTY  11/22/2018   Procedure: PERIPHERAL VASCULAR BALLOON ANGIOPLASTY;  Surgeon: Adine Hoof, MD;  Location: [facility] INVASIVE CV LAB;  Service: Cardiovascular;;  rt AV fistula   PERIPHERAL VASCULAR BALLOON ANGIOPLASTY  10/08/2022   Procedure: PERIPHERAL VASCULAR BALLOON ANGIOPLASTY;  Surgeon: Kayla Part, MD;  Location: [facility] INVASIVE CV LAB;  Service: Cardiovascular;;  arterial anastamosis   PERIPHERAL VASCULAR CATHETERIZATION N/A 10/08/2015   Procedure: A/V Shuntogram;  Surgeon: Dannis Dy, MD;  Location: [facility] INVASIVE CV LAB;  Service: Cardiovascular;  Laterality: N/A;   PERIPHERAL VASCULAR INTERVENTION Left 10/08/2022   Procedure: PERIPHERAL VASCULAR INTERVENTION;   Surgeon: Kayla Part, MD;  Location: [facility] INVASIVE CV LAB;  Service: Cardiovascular;  Laterality: Left;  left AVF   REMOVAL OF A DIALYSIS CATHETER N/A 12/03/2021   Procedure: MINOR REMOVAL OF A TUNNELED DIALYSIS CATHETER;  Surgeon: Mayo Speck, MD;  Location: [facility];  Service: Vascular;  Laterality: N/A;   REMOVAL OF A DIALYSIS CATHETER N/A 11/18/2022   Procedure: MINOR REMOVAL OF A TUNNELED DIALYSIS CATHETER;  Surgeon: Mayo Speck, MD;  Location: [facility];  Service: Vascular;  Laterality: N/A;   THROMBECTOMY W/ EMBOLECTOMY Right 12/10/2017   Procedure: THROMBECTOMY REVISION RIGHT ARM  ARTERIOVENOUS FISTULA;  Surgeon: Dannis Dy, MD;  Location: [facility] OR;  Service: Vascular;  Laterality: Right;   TRANSURETHRAL RESECTION OF PROSTATE N/A 01/04/2014   Procedure: TRANSURETHRAL RESECTION OF THE PROSTATE (TURP) (procedure #2);  Surgeon: Mohammad I Javaid, MD;  Location: [facility];  Service: Urology;  Laterality: N/A;    Social History   Socioeconomic History   Marital status: Single    Spouse name: Not on file   Number of children: Not on file   Years of education: Not on file   Highest education level: Not on file  Occupational History   Occupation: retired   Tobacco Use   Smoking status: Never   Smokeless tobacco: Never  Vaping Use   Vaping status: Never Used  Substance and Sexual Activity   Alcohol use: No   Drug use: No   Sexual activity: Not Currently  Other Topics Concern   Not on file  Social History Narrative   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   Long term resident of [facility]    Social Drivers of Health   Financial Resource Strain: Low Risk  (02/18/2018)   Overall Financial Resource Strain (CARDIA)    Difficulty of Paying Living Expenses: Not hard at all  Food Insecurity: No Food Insecurity (02/17/2024)   Hunger Vital Sign    Worried About Running Out of Food in the Last Year: Never true    Ran Out of Food in the Last Year:  Never true  Transportation Needs: No Transportation Needs (02/17/2024)   PRAPARE - Administrator, Civil Service (Medical): No    Lack of Transportation (Non-Medical): No  Physical Activity: Unknown (09/15/2019)   Exercise Vital Sign    Days of Exercise per Week: Not on file    Minutes of Exercise per Session: 0 min  Stress: No Stress Concern Present (02/18/2018)   [employer] of Occupational Health - Occupational Stress Questionnaire    Feeling of Stress : Not at all  Social Connections: Socially Isolated (02/17/2024)   Social Connection and Isolation Panel [NHANES]    Frequency of Communication with Friends and Family: Three times a  week    Frequency of Social Gatherings with Friends and Family: Once a week    Attends Religious Services: Never    Database administrator or Organizations: No    Attends Banker Meetings: Never    Marital Status: Never married  Intimate Partner Violence: Not At Risk (02/17/2024)   Humiliation, Afraid, Rape, and Kick questionnaire    Fear of Current or Ex-Partner: No    Emotionally Abused: No    Physically Abused: No    Sexually Abused: No   Family History  Problem Relation Age of Onset   Cancer Mother    Colon cancer Neg Hx       VITAL SIGNS BP 137/74   Pulse 74   Temp 98.4 F (36.9 C)   Resp 20   Ht 5\' 9"  (1.753 m)   Wt 206 lb 12.8 oz (93.8 kg)   SpO2 98%   BMI 30.54 kg/m   Outpatient Encounter Medications as of 03/22/2024  Medication Sig Note   aspirin  81 MG chewable tablet Chew 81 mg by mouth daily.    insulin  glargine-yfgn (SEMGLEE , YFGN,) 100 UNIT/ML Pen Inject 8 Units into the skin at bedtime.    levothyroxine  (SYNTHROID ) 150 MCG tablet Take 150 mcg by mouth daily before breakfast.    midodrine  (PROAMATINE ) 10 MG tablet Take 10 mg by mouth See admin instructions. Send medication with resident on Tues/Thurs/Sat to dialysis. Do not administer at [facility], give to resident to take with him to dialysis.  Send #3 tablets of 10mg  and #3 tablets of 5mg  to equal 15mg  per dose. 02/17/2024: Confirmed per [facility] - pt only receives while at dialysis on Tues/Thurs/Sat. Pt receives 2 strengths of midodrine  to equal 15mg  per dose.   midodrine  (PROAMATINE ) 5 MG tablet Take 5 mg by mouth See admin instructions. Send medication with resident on Tues/Thurs/Sat to dialysis. Do not administer at [facility], give to resident to take with him to dialysis. Send #3 tablets of 5mg  and #3 tablets of 10mg  to equal 15mg  per dose.    Omega-3 Fatty Acids (OMEGA-3 FISH OIL PO) Take 2 capsules by mouth at bedtime. Omega 3 - DHA - EPA - Fish oil 300-1000mg  capsules    rosuvastatin  (CRESTOR ) 20 MG tablet Take 20 mg by mouth at bedtime.    sevelamer  carbonate (RENVELA ) 800 MG tablet Take 3 tablets (2,400 mg total) by mouth 3 (three) times daily with meals.    tamsulosin  (FLOMAX ) 0.4 MG CAPS capsule Take 0.4 mg by mouth at bedtime. Give 30 minutes after a meal. Do not crush or chew    torsemide  (DEMADEX ) 20 MG tablet Take 20 mg by mouth See admin instructions. Give 1 tablet (20mg ) by mouth once a day in the afternoon on Sunday, Monday, Wednesday, Friday.    vitamin B-12 (CYANOCOBALAMIN ) 1000 MCG tablet Take 1,000 mcg by mouth at bedtime.    No facility-administered encounter medications on file as of 03/22/2024.     SIGNIFICANT DIAGNOSTIC EXAMS  LABS REVIEWED PREVIOUS;       06-11-23: wbc 4.4; hgb 11.7; hct 35.2; mcv 102.3 plt 88; glucose 160; bun 20; creat 4.71; k+ 3.5; na++ 137; ca 8.4; gfr 12   06-13-23: wbc 3.6; hgb 13.0; hct 39.2; mcv 102.6 plt 110; glucose 103; bun 11; creat 4.13; k+ 3.3; na++ 138; ca 8.6; gfr 14; d-dimer 0.54; CRP <0.5  06-16-23: glucose 99; bun 40; creat 10.88; k+ 4.6; na++ 136; ca 8.8; gfr 5 07-16-23: hgb A1c 5.7;  chol 134; ldl 39; trig 486; hdl 24 09-03-23: tsh 3.852 free t4: 0.82 09-14-23: protein 6.9 albumin  3.2; chol 127; ldl 37 trig 420 hdl 25 12-17-23: wbc 3.9; hgb 10.6; hct 30.5; mcv 103.7 plt 110; hgb  A1c 6.6; PSA 8.02; chol 118; ldl 45; trig 222; hdl 29; tsh 10.663 02-16-24: wbc 4.3; hgb 9.3; hct 27.9; mcv 104.5 plt 81; glucose 138; bun 80; creat 17.85; k+ 5.8; na++ 134; ca 9.3 gfr 2; protein 7.3 albumin  3.4 02-18-24: wbc 3.9; hgb 8.7; hct 25.5; mcv 102.4 plt 78; glucose 133; bun 52; creat 13.86; k+ 4.7; na++ 137; ca 8.7; gfr 3; protein 6.6 albumin  3.1 mag 2.5 phos 6.9   NO NEW LABS.    Review of Systems  Constitutional:  Negative for malaise/fatigue.  Respiratory:  Negative for cough and shortness of breath.   Cardiovascular:  Negative for chest pain, palpitations and leg swelling.  Gastrointestinal:  Negative for abdominal pain, constipation and heartburn.  Musculoskeletal:  Negative for back pain, joint pain and myalgias.  Skin: Negative.   Neurological:  Negative for dizziness.  Psychiatric/Behavioral:  The patient is not nervous/anxious.    Physical Exam Constitutional:      General: He is not in acute distress.    Appearance: He is well-developed. He is obese. He is not diaphoretic.  Neck:     Thyroid : No thyromegaly.  Cardiovascular:     Rate and Rhythm: Normal rate and regular rhythm.     Pulses: Normal pulses.     Heart sounds: Normal heart sounds.  Pulmonary:     Effort: Pulmonary effort is normal. No respiratory distress.     Breath sounds: Normal breath sounds.  Abdominal:     General: Bowel sounds are normal. There is no distension.     Palpations: Abdomen is soft.     Tenderness: There is no abdominal tenderness.  Musculoskeletal:        General: Normal range of motion.     Cervical back: Neck supple.     Right lower leg: No edema.     Left lower leg: No edema.  Lymphadenopathy:     Cervical: No cervical adenopathy.  Skin:    General: Skin is warm and dry.     Comments: Left arm fistula +thrill/bruit    Neurological:     Mental Status: He is alert. Mental status is at baseline.     Comments: SLUMS 2/30  Psychiatric:        Mood and Affect: Mood normal.       ASSESSMENT/ PLAN:  TODAY  Other specified hypothyroidism: tsh 10.663 is on synthroid  150 mcg daily will monitor   2. 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 Major depression single episode moderate: is presently off medications.   PREVIOUS   4. Osteopenia: t score -2.052 is on supplements  5. GERD without esophagitis: is off PPI   6. History of colon cancer: s/p colectomy last seen by GI on 12-19-21; ct scan done on 12-30-21.  7. Dyslipidemia associated with type 2 diabetes mellitus: ldl 37 trig 420 will continue crestor   20 mg daily    8. Vitamin B 12 deficiency: is on daily supplements  9. Secondary hyperparathyroidism of renal origin: will monitor   10. Bilateral lower extremity edema: will continue demedex 20 mg four days per week.   11. 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 Thrombocytopenia: plt 81  13. Aortic atherosclerosis (ct 12-30-21) is on asa and statin  14. 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: on 3 days weekly; no longer on fluid restriction is unable to adhere; is on renvela  2400 mg three times daily   15. BPH: will continue flomax  0.4 mg daily   16. Neurocognitive deficits: SLUMS 2/30   Will check hgb A1c   Britt Candle NP [facility]  call [phone]

## 2024-03-24 ENCOUNTER — Other Ambulatory Visit (HOSPITAL_COMMUNITY)
Admission: RE | Admit: 2024-03-24 | Discharge: 2024-03-24 | Disposition: A | Discharge status: Home / Self Care | Source: Skilled Nursing Facility | Attending: Adult Health | Admitting: Adult Health

## 2024-03-24 DIAGNOSIS — N250 Renal osteodystrophy: Secondary | ICD-10-CM | POA: Diagnosis not present

## 2024-03-24 DIAGNOSIS — Z992 Dependence on renal dialysis: Secondary | ICD-10-CM | POA: Diagnosis not present

## 2024-03-24 DIAGNOSIS — D631 Anemia in chronic kidney disease: Secondary | ICD-10-CM | POA: Diagnosis not present

## 2024-03-24 DIAGNOSIS — D509 Iron deficiency anemia, unspecified: Secondary | ICD-10-CM | POA: Diagnosis not present

## 2024-03-24 DIAGNOSIS — N186 End stage renal disease: Secondary | ICD-10-CM | POA: Diagnosis not present

## 2024-03-24 DIAGNOSIS — E1151 Type 2 diabetes mellitus with diabetic peripheral angiopathy without gangrene: Secondary | ICD-10-CM | POA: Insufficient documentation

## 2024-03-24 DIAGNOSIS — N2581 Secondary hyperparathyroidism of renal origin: Secondary | ICD-10-CM | POA: Diagnosis not present

## 2024-03-24 LAB — HEMOGLOBIN A1C
Hgb A1c MFr Bld: 6 % — ABNORMAL HIGH (ref 4.8–5.6)
Mean Plasma Glucose: 125.5 mg/dL

## 2024-03-26 DIAGNOSIS — Z992 Dependence on renal dialysis: Secondary | ICD-10-CM | POA: Diagnosis not present

## 2024-03-26 DIAGNOSIS — N2581 Secondary hyperparathyroidism of renal origin: Secondary | ICD-10-CM | POA: Diagnosis not present

## 2024-03-26 DIAGNOSIS — N186 End stage renal disease: Secondary | ICD-10-CM | POA: Diagnosis not present

## 2024-03-26 DIAGNOSIS — D631 Anemia in chronic kidney disease: Secondary | ICD-10-CM | POA: Diagnosis not present

## 2024-03-26 DIAGNOSIS — N250 Renal osteodystrophy: Secondary | ICD-10-CM | POA: Diagnosis not present

## 2024-03-26 DIAGNOSIS — D509 Iron deficiency anemia, unspecified: Secondary | ICD-10-CM | POA: Diagnosis not present

## 2024-03-27 DIAGNOSIS — D631 Anemia in chronic kidney disease: Secondary | ICD-10-CM | POA: Diagnosis not present

## 2024-03-27 DIAGNOSIS — N250 Renal osteodystrophy: Secondary | ICD-10-CM | POA: Diagnosis not present

## 2024-03-27 DIAGNOSIS — D509 Iron deficiency anemia, unspecified: Secondary | ICD-10-CM | POA: Diagnosis not present

## 2024-03-27 DIAGNOSIS — N186 End stage renal disease: Secondary | ICD-10-CM | POA: Diagnosis not present

## 2024-03-27 DIAGNOSIS — N2581 Secondary hyperparathyroidism of renal origin: Secondary | ICD-10-CM | POA: Diagnosis not present

## 2024-03-27 DIAGNOSIS — Z992 Dependence on renal dialysis: Secondary | ICD-10-CM | POA: Diagnosis not present

## 2024-03-29 DIAGNOSIS — N186 End stage renal disease: Secondary | ICD-10-CM | POA: Diagnosis not present

## 2024-03-29 DIAGNOSIS — D509 Iron deficiency anemia, unspecified: Secondary | ICD-10-CM | POA: Diagnosis not present

## 2024-03-29 DIAGNOSIS — D631 Anemia in chronic kidney disease: Secondary | ICD-10-CM | POA: Diagnosis not present

## 2024-03-29 DIAGNOSIS — Z992 Dependence on renal dialysis: Secondary | ICD-10-CM | POA: Diagnosis not present

## 2024-03-29 DIAGNOSIS — N250 Renal osteodystrophy: Secondary | ICD-10-CM | POA: Diagnosis not present

## 2024-03-29 DIAGNOSIS — N2581 Secondary hyperparathyroidism of renal origin: Secondary | ICD-10-CM | POA: Diagnosis not present

## 2024-03-31 DIAGNOSIS — Z992 Dependence on renal dialysis: Secondary | ICD-10-CM | POA: Diagnosis not present

## 2024-03-31 DIAGNOSIS — N186 End stage renal disease: Secondary | ICD-10-CM | POA: Diagnosis not present

## 2024-03-31 DIAGNOSIS — D509 Iron deficiency anemia, unspecified: Secondary | ICD-10-CM | POA: Diagnosis not present

## 2024-03-31 DIAGNOSIS — N250 Renal osteodystrophy: Secondary | ICD-10-CM | POA: Diagnosis not present

## 2024-03-31 DIAGNOSIS — D631 Anemia in chronic kidney disease: Secondary | ICD-10-CM | POA: Diagnosis not present

## 2024-03-31 DIAGNOSIS — N2581 Secondary hyperparathyroidism of renal origin: Secondary | ICD-10-CM | POA: Diagnosis not present

## 2024-04-02 DIAGNOSIS — N186 End stage renal disease: Secondary | ICD-10-CM | POA: Diagnosis not present

## 2024-04-02 DIAGNOSIS — N250 Renal osteodystrophy: Secondary | ICD-10-CM | POA: Diagnosis not present

## 2024-04-02 DIAGNOSIS — N2581 Secondary hyperparathyroidism of renal origin: Secondary | ICD-10-CM | POA: Diagnosis not present

## 2024-04-02 DIAGNOSIS — D509 Iron deficiency anemia, unspecified: Secondary | ICD-10-CM | POA: Diagnosis not present

## 2024-04-02 DIAGNOSIS — D631 Anemia in chronic kidney disease: Secondary | ICD-10-CM | POA: Diagnosis not present

## 2024-04-02 DIAGNOSIS — Z992 Dependence on renal dialysis: Secondary | ICD-10-CM | POA: Diagnosis not present

## 2024-04-05 DIAGNOSIS — N2581 Secondary hyperparathyroidism of renal origin: Secondary | ICD-10-CM | POA: Diagnosis not present

## 2024-04-05 DIAGNOSIS — Z992 Dependence on renal dialysis: Secondary | ICD-10-CM | POA: Diagnosis not present

## 2024-04-05 DIAGNOSIS — D631 Anemia in chronic kidney disease: Secondary | ICD-10-CM | POA: Diagnosis not present

## 2024-04-05 DIAGNOSIS — N250 Renal osteodystrophy: Secondary | ICD-10-CM | POA: Diagnosis not present

## 2024-04-05 DIAGNOSIS — D509 Iron deficiency anemia, unspecified: Secondary | ICD-10-CM | POA: Diagnosis not present

## 2024-04-05 DIAGNOSIS — N186 End stage renal disease: Secondary | ICD-10-CM | POA: Diagnosis not present

## 2024-04-06 ENCOUNTER — Other Ambulatory Visit: Payer: Self-pay

## 2024-04-06 DIAGNOSIS — R972 Elevated prostate specific antigen [PSA]: Secondary | ICD-10-CM

## 2024-04-07 DIAGNOSIS — N2581 Secondary hyperparathyroidism of renal origin: Secondary | ICD-10-CM | POA: Diagnosis not present

## 2024-04-07 DIAGNOSIS — N250 Renal osteodystrophy: Secondary | ICD-10-CM | POA: Diagnosis not present

## 2024-04-07 DIAGNOSIS — D509 Iron deficiency anemia, unspecified: Secondary | ICD-10-CM | POA: Diagnosis not present

## 2024-04-07 DIAGNOSIS — D631 Anemia in chronic kidney disease: Secondary | ICD-10-CM | POA: Diagnosis not present

## 2024-04-07 DIAGNOSIS — Z992 Dependence on renal dialysis: Secondary | ICD-10-CM | POA: Diagnosis not present

## 2024-04-07 DIAGNOSIS — N186 End stage renal disease: Secondary | ICD-10-CM | POA: Diagnosis not present

## 2024-04-09 DIAGNOSIS — Z992 Dependence on renal dialysis: Secondary | ICD-10-CM | POA: Diagnosis not present

## 2024-04-09 DIAGNOSIS — D631 Anemia in chronic kidney disease: Secondary | ICD-10-CM | POA: Diagnosis not present

## 2024-04-09 DIAGNOSIS — N2581 Secondary hyperparathyroidism of renal origin: Secondary | ICD-10-CM | POA: Diagnosis not present

## 2024-04-09 DIAGNOSIS — N250 Renal osteodystrophy: Secondary | ICD-10-CM | POA: Diagnosis not present

## 2024-04-09 DIAGNOSIS — N186 End stage renal disease: Secondary | ICD-10-CM | POA: Diagnosis not present

## 2024-04-09 DIAGNOSIS — D509 Iron deficiency anemia, unspecified: Secondary | ICD-10-CM | POA: Diagnosis not present

## 2024-04-11 DIAGNOSIS — E1140 Type 2 diabetes mellitus with diabetic neuropathy, unspecified: Secondary | ICD-10-CM | POA: Diagnosis not present

## 2024-04-11 DIAGNOSIS — L602 Onychogryphosis: Secondary | ICD-10-CM | POA: Diagnosis not present

## 2024-04-11 DIAGNOSIS — I739 Peripheral vascular disease, unspecified: Secondary | ICD-10-CM | POA: Diagnosis not present

## 2024-04-11 DIAGNOSIS — Z794 Long term (current) use of insulin: Secondary | ICD-10-CM | POA: Diagnosis not present

## 2024-04-12 DIAGNOSIS — N250 Renal osteodystrophy: Secondary | ICD-10-CM | POA: Diagnosis not present

## 2024-04-12 DIAGNOSIS — Z992 Dependence on renal dialysis: Secondary | ICD-10-CM | POA: Diagnosis not present

## 2024-04-12 DIAGNOSIS — D509 Iron deficiency anemia, unspecified: Secondary | ICD-10-CM | POA: Diagnosis not present

## 2024-04-12 DIAGNOSIS — D631 Anemia in chronic kidney disease: Secondary | ICD-10-CM | POA: Diagnosis not present

## 2024-04-12 DIAGNOSIS — N2581 Secondary hyperparathyroidism of renal origin: Secondary | ICD-10-CM | POA: Diagnosis not present

## 2024-04-12 DIAGNOSIS — N186 End stage renal disease: Secondary | ICD-10-CM | POA: Diagnosis not present

## 2024-04-14 DIAGNOSIS — Z992 Dependence on renal dialysis: Secondary | ICD-10-CM | POA: Diagnosis not present

## 2024-04-14 DIAGNOSIS — D631 Anemia in chronic kidney disease: Secondary | ICD-10-CM | POA: Diagnosis not present

## 2024-04-14 DIAGNOSIS — N2581 Secondary hyperparathyroidism of renal origin: Secondary | ICD-10-CM | POA: Diagnosis not present

## 2024-04-14 DIAGNOSIS — N250 Renal osteodystrophy: Secondary | ICD-10-CM | POA: Diagnosis not present

## 2024-04-14 DIAGNOSIS — D509 Iron deficiency anemia, unspecified: Secondary | ICD-10-CM | POA: Diagnosis not present

## 2024-04-14 DIAGNOSIS — N186 End stage renal disease: Secondary | ICD-10-CM | POA: Diagnosis not present

## 2024-04-15 ENCOUNTER — Other Ambulatory Visit (HOSPITAL_COMMUNITY)
Admission: RE | Admit: 2024-04-15 | Discharge: 2024-04-15 | Disposition: A | Discharge status: Home / Self Care | Source: Skilled Nursing Facility | Attending: Internal Medicine | Admitting: Internal Medicine

## 2024-04-15 DIAGNOSIS — N401 Enlarged prostate with lower urinary tract symptoms: Secondary | ICD-10-CM | POA: Diagnosis not present

## 2024-04-15 LAB — PSA: Prostatic Specific Antigen: 4.91 ng/mL — ABNORMAL HIGH (ref 0.00–4.00)

## 2024-04-16 DIAGNOSIS — N186 End stage renal disease: Secondary | ICD-10-CM | POA: Diagnosis not present

## 2024-04-16 DIAGNOSIS — D631 Anemia in chronic kidney disease: Secondary | ICD-10-CM | POA: Diagnosis not present

## 2024-04-16 DIAGNOSIS — N2581 Secondary hyperparathyroidism of renal origin: Secondary | ICD-10-CM | POA: Diagnosis not present

## 2024-04-16 DIAGNOSIS — Z992 Dependence on renal dialysis: Secondary | ICD-10-CM | POA: Diagnosis not present

## 2024-04-16 DIAGNOSIS — D509 Iron deficiency anemia, unspecified: Secondary | ICD-10-CM | POA: Diagnosis not present

## 2024-04-16 DIAGNOSIS — N250 Renal osteodystrophy: Secondary | ICD-10-CM | POA: Diagnosis not present

## 2024-04-19 DIAGNOSIS — N250 Renal osteodystrophy: Secondary | ICD-10-CM | POA: Diagnosis not present

## 2024-04-19 DIAGNOSIS — D631 Anemia in chronic kidney disease: Secondary | ICD-10-CM | POA: Diagnosis not present

## 2024-04-19 DIAGNOSIS — N2581 Secondary hyperparathyroidism of renal origin: Secondary | ICD-10-CM | POA: Diagnosis not present

## 2024-04-19 DIAGNOSIS — D509 Iron deficiency anemia, unspecified: Secondary | ICD-10-CM | POA: Diagnosis not present

## 2024-04-19 DIAGNOSIS — N186 End stage renal disease: Secondary | ICD-10-CM | POA: Diagnosis not present

## 2024-04-19 DIAGNOSIS — Z992 Dependence on renal dialysis: Secondary | ICD-10-CM | POA: Diagnosis not present

## 2024-04-20 ENCOUNTER — Non-Acute Institutional Stay (SKILLED_NURSING_FACILITY): Payer: Self-pay | Admitting: Adult Health

## 2024-04-20 ENCOUNTER — Encounter: Payer: Self-pay | Admitting: Adult Health

## 2024-04-20 DIAGNOSIS — K219 Gastro-esophageal reflux disease without esophagitis: Secondary | ICD-10-CM

## 2024-04-20 DIAGNOSIS — Z85038 Personal history of other malignant neoplasm of large intestine: Secondary | ICD-10-CM

## 2024-04-20 DIAGNOSIS — M8580 Other specified disorders of bone density and structure, unspecified site: Secondary | ICD-10-CM | POA: Diagnosis not present

## 2024-04-20 NOTE — Progress Notes (Signed)
 Location:  [facility] Room Number: 112D Place of Service:  SNF (31)   CODE STATUS: FULL  No Known Allergies  Chief Complaint  Patient presents with   Medical Management of Chronic Issues          Osteopenia:  GERD without esophagitis:  History of colon cancer    HPI:  He is a 78 y.o. long term resident of this facility being seen for the management of his chronic illnesses:Osteopenia:  GERD without esophagitis:  History of colon cancer. There are no reports of uncontrolled pain. He continues with dialysis 3 days weekly. He continues to get out of bed to wheelchair.    Past Medical History:  Diagnosis Date   Abnormal CT scan, kidney 10/06/2011   Acute pyelonephritis 10/07/2011   Anemia    normocytic   Anxiety    mental retardation   Bladder wall thickening 10/06/2011   BPH (benign prostatic hypertrophy)    Diabetes mellitus    Dialysis patient (HCC)    Tuesday, Thursday and Saturday,    DVT of leg (deep venous thrombosis) (HCC) 12/25/2016   Edema     history of lower extremity edema   GERD (gastroesophageal reflux disease)    Heme positive stool    Hydronephrosis    Hyperkalemia    Hyperlipidemia    Hypernatremia    Hypertension    Hypothyroidism    Impaired speech    Infected prosthetic vascular graft (HCC)    MR (mental retardation)    Muscle weakness    Obstructive uropathy    Perinephric abscess 10/07/2011   Poor historian poor historian   Primary colorectal adenocarcinoma (HCC) 04/2017   S/p total colectomy   Protein calorie malnutrition (HCC)    Pyelonephritis    Renal failure (ARF), acute on chronic (HCC)    Renal insufficiency    chronic history   Sepsis (HCC)    Smoking    Uremia    Urinary retention    UTI (lower urinary tract infection) 10/06/2011    Past Surgical History:  Procedure Laterality Date   A/V FISTULAGRAM N/A 08/13/2018   Procedure: A/V FISTULAGRAM - Right Upper;  Surgeon: Harvey Carlin BRAVO, MD;   Location: [facility] INVASIVE CV LAB;  Service: Cardiovascular;  Laterality: N/A;   A/V FISTULAGRAM N/A 11/22/2018   Procedure: A/V FISTULAGRAM - Right Upper;  Surgeon: Sheree Penne Bruckner, MD;  Location: [facility] INVASIVE CV LAB;  Service: Cardiovascular;  Laterality: N/A;   A/V FISTULAGRAM Left 10/08/2022   Procedure: A/V Fistulagram;  Surgeon: Lanis Fonda BRAVO, MD;  Location: [facility] INVASIVE CV LAB;  Service: Cardiovascular;  Laterality: Left;   A/V FISTULAGRAM Left 05/06/2023   Procedure: A/V Fistulagram;  Surgeon: Magda Debby SAILOR, MD;  Location: [facility] INVASIVE CV LAB;  Service: Cardiovascular;  Laterality: Left;   AV FISTULA PLACEMENT Left 07/06/2015   Procedure:  INSERTION LEFT ARM ARTERIOVENOUS GORTEX GRAFT;  Surgeon: Bruckner GORMAN Blade, MD;  Location: [facility] OR;  Service: Vascular;  Laterality: Left;   AV FISTULA PLACEMENT Right 02/26/2016   Procedure: ARTERIOVENOUS (AV) FISTULA CREATION ;  Surgeon: Bruckner GORMAN Blade, MD;  Location: [facility] OR;  Service: Vascular;  Laterality: Right;   AV FISTULA PLACEMENT Right 11/25/2018   Procedure: INSERTION OF ARTERIOVENOUS (AV) ARTEGRAFT RIGHT UPPER ARM;  Surgeon: Sheree Penne Bruckner, MD;  Location: [facility] OR;  Service: Vascular;  Laterality: Right;   AV FISTULA PLACEMENT Left 05/28/2021   Procedure: LEFT ARM ARTERIOVENOUS (AV) FISTULA;  Surgeon: Oris Krystal FALCON,  MD;  Location: [facility] ORS;  Service: Vascular;  Laterality: Left;   AVGG REMOVAL Left 10/09/2015   Procedure: REMOVAL OF ARTERIOVENOUS GORETEX GRAFT (AVGG) Evacuation of Lymphocele, Vein Patch angioplasty of brachial artery.;  Surgeon: Lonni GORMAN Blade, MD;  Location: [facility] OR;  Service: Vascular;  Laterality: Left;   BASCILIC VEIN TRANSPOSITION Right 02/26/2016   Procedure: Right BASCILIC VEIN TRANSPOSITION;  Surgeon: Lonni GORMAN Blade, MD;  Location: [facility] OR;  Service: Vascular;  Laterality: Right;   BASCILIC VEIN TRANSPOSITION Left 07/30/2021   Procedure: LEFT ARM SECOND STAGE BASILIC VEIN TRANSPOSITION;   Surgeon: Oris Krystal FALCON, MD;  Location: [facility] ORS;  Service: Vascular;  Laterality: Left;   CIRCUMCISION N/A 01/04/2014   Procedure: CIRCUMCISION ADULT (procedure #1);  Surgeon: Mohammad I Javaid, MD;  Location: [facility] ORS;  Service: Urology;  Laterality: N/A;   COLECTOMY N/A 05/04/2017   Procedure: TOTAL COLECTOMY;  Surgeon: Mavis Anes, MD;  Location: [facility] ORS;  Service: General;  Laterality: N/A;   COLONOSCOPY N/A 04/27/2017   Surgeon: Shaaron Lamar HERO, MD; annular mass in the ascending colon likely representing cancer biopsied, multiple 6-22 mm polyps removed, clean rectum.  Pathology with multiple tubular adenomas, high-grade dysplasia noted in ascending colon and splenic flexure.   CYSTOSCOPY W/ RETROGRADES Bilateral 06/29/2015   Procedure: CYSTOSCOPY, DILATION OF URETHRAL STRICTURE WITH BILATERAL RETROGRADE PYELOGRAM,SUPRAPUBIC TUBE CHANGE;  Surgeon: Donnice Brooks, MD;  Location: [facility] ORS;  Service: Urology;  Laterality: Bilateral;   CYSTOSCOPY WITH URETHRAL DILATATION N/A 12/29/2013   Procedure: CYSTOSCOPY WITH URETHRAL DILATATION;  Surgeon: Emery LILLETTE Blaze, MD;  Location: [facility] ORS;  Service: Urology;  Laterality: N/A;   ESOPHAGOGASTRODUODENOSCOPY N/A 04/27/2017   Procedure: ESOPHAGOGASTRODUODENOSCOPY (EGD);  Surgeon: Shaaron Lamar HERO, MD;  Location: [facility] ENDO [facility];  Service: Endoscopy;  Laterality: N/A;   FLEXIBLE SIGMOIDOSCOPY N/A 12/02/2021   Procedure: FLEXIBLE SIGMOIDOSCOPY;  Surgeon: Cindie Carlin POUR, DO;  Location: [facility];  Service: Endoscopy;  Laterality: N/A;  10:30am, dialysis patient   INSERTION OF DIALYSIS CATHETER Right 11/25/2018   Procedure: INSERTION OF DIALYSIS CATHETER RIGHT INTERNAL JUGULAR;  Surgeon: Sheree Penne Lonni, MD;  Location: [facility] OR;  Service: Vascular;  Laterality: Right;   IR AV DIALY SHUNT INTRO NEEDLE/INTRAC INITIAL W/PTA/STENT/IMG LT Left 02/16/2024   IR AV DIALY SHUNT INTRO NEEDLE/INTRACATH INITIAL W/PTA/IMG RIGHT Right 09/07/2018   IR FLUORO GUIDE CV  LINE RIGHT  10/16/2020   IR FLUORO GUIDE CV LINE RIGHT  10/03/2022   IR REMOVAL TUN CV CATH W/O FL  01/12/2019   IR THROMBECTOMY AV FISTULA W/THROMBOLYSIS/PTA INC/SHUNT/IMG RIGHT Right 04/26/2018   IR US  GUIDE VASC ACCESS LEFT  02/17/2024   IR US  GUIDE VASC ACCESS RIGHT  04/26/2018   IR US  GUIDE VASC ACCESS RIGHT  09/07/2018   IR US  GUIDE VASC ACCESS RIGHT  10/16/2020   IR US  GUIDE VASC ACCESS RIGHT  10/16/2020   IR US  GUIDE VASC ACCESS RIGHT  10/03/2022   ORIF FEMUR FRACTURE Right 11/22/2016   Procedure: OPEN REDUCTION INTERNAL FIXATION (ORIF) DISTAL FEMUR FRACTURE;  Surgeon: Redell Shoals, MD;  Location: [facility] OR;  Service: Orthopedics;  Laterality: Right;   PATCH ANGIOPLASTY Right 12/10/2017   Procedure: PATCH ANGIOPLASTY;  Surgeon: Blade Lonni GORMAN, MD;  Location: [facility] OR;  Service: Vascular;  Laterality: Right;   PERIPHERAL VASCULAR BALLOON ANGIOPLASTY  08/13/2018   Procedure: PERIPHERAL VASCULAR BALLOON ANGIOPLASTY;  Surgeon: Harvey Carlin BRAVO, MD;  Location: [facility] INVASIVE CV LAB;  Service: Cardiovascular;;  right AV fistula    PERIPHERAL VASCULAR  BALLOON ANGIOPLASTY  11/22/2018   Procedure: PERIPHERAL VASCULAR BALLOON ANGIOPLASTY;  Surgeon: Sheree Penne Bruckner, MD;  Location: [facility] INVASIVE CV LAB;  Service: Cardiovascular;;  rt AV fistula   PERIPHERAL VASCULAR BALLOON ANGIOPLASTY  10/08/2022   Procedure: PERIPHERAL VASCULAR BALLOON ANGIOPLASTY;  Surgeon: Lanis Fonda BRAVO, MD;  Location: [facility] INVASIVE CV LAB;  Service: Cardiovascular;;  arterial anastamosis   PERIPHERAL VASCULAR CATHETERIZATION N/A 10/08/2015   Procedure: A/V Shuntogram;  Surgeon: Bruckner GORMAN Blade, MD;  Location: [facility] INVASIVE CV LAB;  Service: Cardiovascular;  Laterality: N/A;   PERIPHERAL VASCULAR INTERVENTION Left 10/08/2022   Procedure: PERIPHERAL VASCULAR INTERVENTION;  Surgeon: Lanis Fonda BRAVO, MD;  Location: [facility] INVASIVE CV LAB;  Service: Cardiovascular;  Laterality: Left;  left AVF   REMOVAL OF A DIALYSIS  CATHETER N/A 12/03/2021   Procedure: MINOR REMOVAL OF A TUNNELED DIALYSIS CATHETER;  Surgeon: Oris Krystal FALCON, MD;  Location: [facility];  Service: Vascular;  Laterality: N/A;   REMOVAL OF A DIALYSIS CATHETER N/A 11/18/2022   Procedure: MINOR REMOVAL OF A TUNNELED DIALYSIS CATHETER;  Surgeon: Oris Krystal FALCON, MD;  Location: [facility] ORS;  Service: Vascular;  Laterality: N/A;   THROMBECTOMY W/ EMBOLECTOMY Right 12/10/2017   Procedure: THROMBECTOMY REVISION RIGHT ARM  ARTERIOVENOUS FISTULA;  Surgeon: Blade Bruckner GORMAN, MD;  Location: [facility] OR;  Service: Vascular;  Laterality: Right;   TRANSURETHRAL RESECTION OF PROSTATE N/A 01/04/2014   Procedure: TRANSURETHRAL RESECTION OF THE PROSTATE (TURP) (procedure #2);  Surgeon: Mohammad I Javaid, MD;  Location: [facility] ORS;  Service: Urology;  Laterality: N/A;    Social History   Socioeconomic History   Marital status: Single    Spouse name: Not on file   Number of children: Not on file   Years of education: Not on file   Highest education level: Not on file  Occupational History   Occupation: retired   Tobacco Use   Smoking status: Never   Smokeless tobacco: Never  Vaping Use   Vaping status: Never Used  Substance and Sexual Activity   Alcohol use: No   Drug use: No   Sexual activity: Not Currently  Other Topics Concern   Not on file  Social History Narrative   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   Long term resident of [facility]    Social Drivers of Health   Financial Resource Strain: Low Risk  (02/18/2018)   Overall Financial Resource Strain (CARDIA)    Difficulty of Paying Living Expenses: Not hard at all  Food Insecurity: No Food Insecurity (02/17/2024)   Hunger Vital Sign    Worried About Running Out of Food in the Last Year: Never true    Ran Out of Food in the Last Year: Never true  Transportation Needs: No Transportation Needs (02/17/2024)   PRAPARE - Administrator, Civil Service (Medical): No     Lack of Transportation (Non-Medical): No  Physical Activity: Unknown (09/15/2019)   Exercise Vital Sign    Days of Exercise per Week: Not on file    Minutes of Exercise per Session: 0 min  Stress: No Stress Concern Present (02/18/2018)   [employer] of Occupational Health - Occupational Stress Questionnaire    Feeling of Stress : Not at all  Social Connections: Socially Isolated (02/17/2024)   Social Connection and Isolation Panel    Frequency of Communication with Friends and Family: Three times a week    Frequency of Social Gatherings with Friends and Family: Once a week  Attends Religious Services: Never    Active Member of Clubs or Organizations: No    Attends Banker Meetings: Never    Marital Status: Never married  Intimate Partner Violence: Not At Risk (02/17/2024)   Humiliation, Afraid, Rape, and Kick questionnaire    Fear of Current or Ex-Partner: No    Emotionally Abused: No    Physically Abused: No    Sexually Abused: No   Family History  Problem Relation Age of Onset   Cancer Mother    Colon cancer Neg Hx       VITAL SIGNS BP 136/70   Pulse 74   Temp (!) 97.2 F (36.2 C)   Resp (!) 22   Ht 5' 9 (1.753 m)   Wt 205 lb 3.2 oz (93.1 kg)   SpO2 98%   BMI 30.30 kg/m   Outpatient Encounter Medications as of 04/20/2024  Medication Sig Note   aspirin  81 MG chewable tablet Chew 81 mg by mouth daily.    Continuous Glucose Sensor (FREESTYLE LIBRE 2 PLUS SENSOR) MISC by Does not apply route.    insulin  glargine-yfgn (SEMGLEE , YFGN,) 100 UNIT/ML Pen Inject 8 Units into the skin at bedtime.    levothyroxine  (SYNTHROID ) 150 MCG tablet Take 150 mcg by mouth daily before breakfast.    midodrine  (PROAMATINE ) 10 MG tablet Take 10 mg by mouth See admin instructions. Send medication with resident on Tues/Thurs/Sat to dialysis. Do not administer at [facility], give to resident to take with him to dialysis. Send #3 tablets of 10mg  and #3 tablets of 5mg  to equal  15mg  per dose. 02/17/2024: Confirmed per [facility] - pt only receives while at dialysis on Tues/Thurs/Sat. Pt receives 2 strengths of midodrine  to equal 15mg  per dose.   midodrine  (PROAMATINE ) 5 MG tablet Take 5 mg by mouth See admin instructions. Send medication with resident on Tues/Thurs/Sat to dialysis. Do not administer at [facility], give to resident to take with him to dialysis. Send #3 tablets of 5mg  and #3 tablets of 10mg  to equal 15mg  per dose.    Omega-3 Fatty Acids (OMEGA-3 FISH OIL PO) Take 2 capsules by mouth at bedtime. Omega 3 - DHA - EPA - Fish oil 300-1000mg  capsules    rosuvastatin  (CRESTOR ) 20 MG tablet Take 20 mg by mouth at bedtime.    sevelamer  carbonate (RENVELA ) 800 MG tablet Take 3 tablets (2,400 mg total) by mouth 3 (three) times daily with meals.    tamsulosin  (FLOMAX ) 0.4 MG CAPS capsule Take 0.4 mg by mouth at bedtime. Give 30 minutes after a meal. Do not crush or chew    torsemide  (DEMADEX ) 20 MG tablet Take 20 mg by mouth See admin instructions. Give 1 tablet (20mg ) by mouth once a day in the afternoon on Sunday, Monday, Wednesday, Friday.    vitamin B-12 (CYANOCOBALAMIN ) 1000 MCG tablet Take 1,000 mcg by mouth at bedtime. (Patient not taking: Reported on 04/20/2024)    No facility-administered encounter medications on file as of 04/20/2024.     SIGNIFICANT DIAGNOSTIC EXAMS  LABS REVIEWED PREVIOUS;       06-11-23: wbc 4.4; hgb 11.7; hct 35.2; mcv 102.3 plt 88; glucose 160; bun 20; creat 4.71; k+ 3.5; na++ 137; ca 8.4; gfr 12   06-13-23: wbc 3.6; hgb 13.0; hct 39.2; mcv 102.6 plt 110; glucose 103; bun 11; creat 4.13; k+ 3.3; na++ 138; ca 8.6; gfr 14; d-dimer 0.54; CRP <0.5  06-16-23: glucose 99; bun 40; creat 10.88; k+ 4.6; na++ 136; ca  8.8; gfr 5 07-16-23: hgb A1c 5.7; chol 134; ldl 39; trig 486; hdl 24 09-03-23: tsh 3.852 free t4: 0.82 09-14-23: protein 6.9 albumin  3.2; chol 127; ldl 37 trig 420 hdl 25 12-17-23: wbc 3.9; hgb 10.6; hct 30.5; mcv 103.7 plt 110; hgb A1c 6.6;  PSA 8.02; chol 118; ldl 45; trig 222; hdl 29; tsh 10.663 02-16-24: wbc 4.3; hgb 9.3; hct 27.9; mcv 104.5 plt 81; glucose 138; bun 80; creat 17.85; k+ 5.8; na++ 134; ca 9.3 gfr 2; protein 7.3 albumin  3.4 02-18-24: wbc 3.9; hgb 8.7; hct 25.5; mcv 102.4 plt 78; glucose 133; bun 52; creat 13.86; k+ 4.7; na++ 137; ca 8.7; gfr 3; protein 6.6 albumin  3.1 mag 2.5 phos 6.9   TODAY  02-23-24: wbc 4.8; hgb 8.8; hct 26.8; mcv 105.9 plt 140; tsh 2.608; vitamin B 12: 4099 02-26-24:vitamin B12: 2246 03-24-24: hgb A1c 6.0 04-15-24: PSA 4.91    Review of Systems  Constitutional:  Negative for malaise/fatigue.  Respiratory:  Negative for cough and shortness of breath.   Cardiovascular:  Negative for chest pain, palpitations and leg swelling.  Gastrointestinal:  Negative for abdominal pain, constipation and heartburn.  Musculoskeletal:  Negative for back pain, joint pain and myalgias.  Skin: Negative.   Neurological:  Negative for dizziness.  Psychiatric/Behavioral:  The patient is not nervous/anxious.    Physical Exam Constitutional:      General: He is not in acute distress.    Appearance: He is well-developed. He is obese. He is not diaphoretic.  Neck:     Thyroid : No thyromegaly.   Cardiovascular:     Rate and Rhythm: Normal rate and regular rhythm.     Pulses: Normal pulses.     Heart sounds: Normal heart sounds.  Pulmonary:     Effort: Pulmonary effort is normal. No respiratory distress.     Breath sounds: Normal breath sounds.  Abdominal:     General: Bowel sounds are normal. There is no distension.     Palpations: Abdomen is soft.     Tenderness: There is no abdominal tenderness.   Musculoskeletal:        General: Normal range of motion.     Cervical back: Neck supple.     Right lower leg: No edema.     Left lower leg: No edema.  Lymphadenopathy:     Cervical: No cervical adenopathy.   Skin:    General: Skin is warm and dry.     Comments:  Left arm fistula +thrill/bruit    Neurological:     Mental Status: He is alert. Mental status is at baseline.     Comments:  SLUMS 2/30   Psychiatric:        Mood and Affect: Mood normal.        ASSESSMENT/ PLAN:  TODAY  Osteopenia: t score -2.052 is on supplements  2. GERD without esophagitis: is off PPI  3. History of colon cancer: s/t colectomy last seen by GI on 12-19-21 ct scan done 12-30-21  PREVIOUS   4. Dyslipidemia associated with type 2 diabetes mellitus: ldl 37 trig 420 will continue crestor   20 mg daily    5. Vitamin B 12 deficiency: is on daily supplements  6. Secondary hyperparathyroidism of renal origin: will monitor   7. Bilateral lower extremity edema: will continue demedex 20 mg four days per week.   8. 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 Thrombocytopenia: plt 81  10. Aortic atherosclerosis (ct 12-30-21) is on asa and statin  11. 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: on 3 days weekly; no longer on fluid restriction is unable to adhere; is on renvela  2400 mg three times daily   12. BPH: will continue flomax  0.4 mg daily   13. Neurocognitive deficits: SLUMS 2/30   14. Other specified hypothyroidism: tsh 2.608 is on synthroid  150 mcg daily will monitor   15. Anemia of chronic renal disease on chronic dialysis hgb 8.8; hct 26.8  16. Major depression single episode moderate: is presently off medications.   17. Elevated PSA: is lower at 4.91      Barnie Seip NP [facility]  call [phone]

## 2024-04-21 DIAGNOSIS — N2581 Secondary hyperparathyroidism of renal origin: Secondary | ICD-10-CM | POA: Diagnosis not present

## 2024-04-21 DIAGNOSIS — N250 Renal osteodystrophy: Secondary | ICD-10-CM | POA: Diagnosis not present

## 2024-04-21 DIAGNOSIS — D509 Iron deficiency anemia, unspecified: Secondary | ICD-10-CM | POA: Diagnosis not present

## 2024-04-21 DIAGNOSIS — Z992 Dependence on renal dialysis: Secondary | ICD-10-CM | POA: Diagnosis not present

## 2024-04-21 DIAGNOSIS — D631 Anemia in chronic kidney disease: Secondary | ICD-10-CM | POA: Diagnosis not present

## 2024-04-21 DIAGNOSIS — N186 End stage renal disease: Secondary | ICD-10-CM | POA: Diagnosis not present

## 2024-04-22 ENCOUNTER — Other Ambulatory Visit

## 2024-04-23 DIAGNOSIS — N2581 Secondary hyperparathyroidism of renal origin: Secondary | ICD-10-CM | POA: Diagnosis not present

## 2024-04-23 DIAGNOSIS — D631 Anemia in chronic kidney disease: Secondary | ICD-10-CM | POA: Diagnosis not present

## 2024-04-23 DIAGNOSIS — D509 Iron deficiency anemia, unspecified: Secondary | ICD-10-CM | POA: Diagnosis not present

## 2024-04-23 DIAGNOSIS — N250 Renal osteodystrophy: Secondary | ICD-10-CM | POA: Diagnosis not present

## 2024-04-23 DIAGNOSIS — Z992 Dependence on renal dialysis: Secondary | ICD-10-CM | POA: Diagnosis not present

## 2024-04-23 DIAGNOSIS — N186 End stage renal disease: Secondary | ICD-10-CM | POA: Diagnosis not present

## 2024-04-25 DIAGNOSIS — F70 Mild intellectual disabilities: Secondary | ICD-10-CM | POA: Diagnosis not present

## 2024-04-25 DIAGNOSIS — F4322 Adjustment disorder with anxiety: Secondary | ICD-10-CM | POA: Diagnosis not present

## 2024-04-25 DIAGNOSIS — N186 End stage renal disease: Secondary | ICD-10-CM | POA: Diagnosis not present

## 2024-04-25 DIAGNOSIS — Z992 Dependence on renal dialysis: Secondary | ICD-10-CM | POA: Diagnosis not present

## 2024-04-26 DIAGNOSIS — N186 End stage renal disease: Secondary | ICD-10-CM | POA: Diagnosis not present

## 2024-04-26 DIAGNOSIS — N250 Renal osteodystrophy: Secondary | ICD-10-CM | POA: Diagnosis not present

## 2024-04-26 DIAGNOSIS — D631 Anemia in chronic kidney disease: Secondary | ICD-10-CM | POA: Diagnosis not present

## 2024-04-26 DIAGNOSIS — D509 Iron deficiency anemia, unspecified: Secondary | ICD-10-CM | POA: Diagnosis not present

## 2024-04-26 DIAGNOSIS — Z992 Dependence on renal dialysis: Secondary | ICD-10-CM | POA: Diagnosis not present

## 2024-04-28 DIAGNOSIS — N250 Renal osteodystrophy: Secondary | ICD-10-CM | POA: Diagnosis not present

## 2024-04-28 DIAGNOSIS — D509 Iron deficiency anemia, unspecified: Secondary | ICD-10-CM | POA: Diagnosis not present

## 2024-04-28 DIAGNOSIS — N186 End stage renal disease: Secondary | ICD-10-CM | POA: Diagnosis not present

## 2024-04-28 DIAGNOSIS — D631 Anemia in chronic kidney disease: Secondary | ICD-10-CM | POA: Diagnosis not present

## 2024-04-28 DIAGNOSIS — Z992 Dependence on renal dialysis: Secondary | ICD-10-CM | POA: Diagnosis not present

## 2024-04-30 DIAGNOSIS — D631 Anemia in chronic kidney disease: Secondary | ICD-10-CM | POA: Diagnosis not present

## 2024-04-30 DIAGNOSIS — N250 Renal osteodystrophy: Secondary | ICD-10-CM | POA: Diagnosis not present

## 2024-04-30 DIAGNOSIS — D509 Iron deficiency anemia, unspecified: Secondary | ICD-10-CM | POA: Diagnosis not present

## 2024-04-30 DIAGNOSIS — N186 End stage renal disease: Secondary | ICD-10-CM | POA: Diagnosis not present

## 2024-04-30 DIAGNOSIS — Z992 Dependence on renal dialysis: Secondary | ICD-10-CM | POA: Diagnosis not present

## 2024-05-03 DIAGNOSIS — N186 End stage renal disease: Secondary | ICD-10-CM | POA: Diagnosis not present

## 2024-05-03 DIAGNOSIS — N250 Renal osteodystrophy: Secondary | ICD-10-CM | POA: Diagnosis not present

## 2024-05-03 DIAGNOSIS — Z992 Dependence on renal dialysis: Secondary | ICD-10-CM | POA: Diagnosis not present

## 2024-05-03 DIAGNOSIS — D631 Anemia in chronic kidney disease: Secondary | ICD-10-CM | POA: Diagnosis not present

## 2024-05-03 DIAGNOSIS — D509 Iron deficiency anemia, unspecified: Secondary | ICD-10-CM | POA: Diagnosis not present

## 2024-05-05 DIAGNOSIS — Z992 Dependence on renal dialysis: Secondary | ICD-10-CM | POA: Diagnosis not present

## 2024-05-05 DIAGNOSIS — N250 Renal osteodystrophy: Secondary | ICD-10-CM | POA: Diagnosis not present

## 2024-05-05 DIAGNOSIS — D631 Anemia in chronic kidney disease: Secondary | ICD-10-CM | POA: Diagnosis not present

## 2024-05-05 DIAGNOSIS — D509 Iron deficiency anemia, unspecified: Secondary | ICD-10-CM | POA: Diagnosis not present

## 2024-05-05 DIAGNOSIS — N186 End stage renal disease: Secondary | ICD-10-CM | POA: Diagnosis not present

## 2024-05-06 ENCOUNTER — Ambulatory Visit (INDEPENDENT_AMBULATORY_CARE_PROVIDER_SITE_OTHER): Admitting: Urology

## 2024-05-06 VITALS — BP 155/74 | HR 81

## 2024-05-06 DIAGNOSIS — R972 Elevated prostate specific antigen [PSA]: Secondary | ICD-10-CM

## 2024-05-06 NOTE — Progress Notes (Unsigned)
 05/06/2024 10:12 AM   Zachary [name] March 30, 1946 [id number]  Referring provider: Landy Barnie RAMAN, NP [address] [place],  [place] [address]  No chief complaint on file.   HPI: PSA decreased to 4.9 from 8.0. No worsening LUTS. NO hematuria or dysuria.    PMH: Past Medical History:  Diagnosis Date   Abnormal CT scan, kidney 10/06/2011   Acute pyelonephritis 10/07/2011   Anemia    normocytic   Anxiety    mental retardation   Bladder wall thickening 10/06/2011   BPH (benign prostatic hypertrophy)    Diabetes mellitus    Dialysis patient (HCC)    Tuesday, Thursday and Saturday,    DVT of leg (deep venous thrombosis) (HCC) 12/25/2016   Edema     history of lower extremity edema   GERD (gastroesophageal reflux disease)    Heme positive stool    Hydronephrosis    Hyperkalemia    Hyperlipidemia    Hypernatremia    Hypertension    Hypothyroidism    Impaired speech    Infected prosthetic vascular graft (HCC)    MR (mental retardation)    Muscle weakness    Obstructive uropathy    Perinephric abscess 10/07/2011   Poor historian poor historian   Primary colorectal adenocarcinoma (HCC) 04/2017   S/p total colectomy   Protein calorie malnutrition (HCC)    Pyelonephritis    Renal failure (ARF), acute on chronic (HCC)    Renal insufficiency    chronic history   Sepsis (HCC)    Smoking    Uremia    Urinary retention    UTI (lower urinary tract infection) 10/06/2011    Surgical History: Past Surgical History:  Procedure Laterality Date   A/V FISTULAGRAM N/A 08/13/2018   Procedure: A/V FISTULAGRAM - Right Upper;  Surgeon: Harvey Carlin BRAVO, MD;  [facility]: [facility] INV[facility] CV LAB;  Service: Cardiovascular;  Laterality: N/A;   A/V FISTULAGRAM N/A 11/22/2018   Procedure: A/V FISTULAGRAM - Right Upper;  Surgeon: Sheree Penne Bruckner, MD;  Location: [facility] INVASIVE CV LAB;  Service: Cardiovascular;  Laterality: N/A;   A/V FISTULAGRAM Left 10/08/2022   Procedure: A/V  Fistulagram;  Surgeon: Lanis Fonda BRAVO, MD;  Location: [facility] INVASIVE CV LAB;  Service: Cardiovascular;  Laterality: Left;   A/V FISTULAGRAM Left 05/06/2023   Procedure: A/V Fistulagram;  Surgeon: Magda Debby SAILOR, MD;  Location: [facility] INVASIVE CV LAB;  Service: Cardiovascular;  Laterality: Left;   AV FISTULA PLACEMENT Left 07/06/2015   Procedure:  INSERTION LEFT ARM ARTERIOVENOUS GORTEX GRAFT;  Surgeon: Bruckner RAMAN Blade, MD;  Location: [facility] OR;  Service: Vascular;  Laterality: Left;   AV FISTULA PLACEMENT Right 02/26/2016   Procedure: ARTERIOVENOUS (AV) FISTULA CREATION ;  Surgeon: Bruckner RAMAN Blade, MD;  Location: [facility] OR;  Service: Vascular;  Laterality: Right;   AV FISTULA PLACEMENT Right 11/25/2018   Procedure: INSERTION OF ARTERIOVENOUS (AV) ARTEGRAFT RIGHT UPPER ARM;  Surgeon: Sheree Penne Bruckner, MD;  Location: [facility] OR;  Service: Vascular;  Laterality: Right;   AV FISTULA PLACEMENT Left 05/28/2021   Procedure: LEFT ARM ARTERIOVENOUS (AV) FISTULA;  Surgeon: Oris Krystal FALCON, MD;  Location: [facility] ORS;  Service: Vascular;  Laterality: Left;   AVGG REMOVAL Left 10/09/2015   Procedure: REMOVAL OF ARTERIOVENOUS GORETEX GRAFT (AVGG) Evacuation of Lymphocele, Vein Patch angioplasty of brachial artery.;  Surgeon: Bruckner RAMAN Blade, MD;  Location: [facility] OR;  Service: Vascular;  Laterality: Left;   BASCILIC VEIN TRANSPOSITION Right 02/26/2016   Procedure: Right BASCILIC VEIN  TRANSPOSITION;  Surgeon: Lonni GORMAN Blade, MD;  Location: [facility] OR;  Service: Vascular;  Laterality: Right;   BASCILIC VEIN TRANSPOSITION Left 07/30/2021   Procedure: LEFT ARM SECOND STAGE BASILIC VEIN TRANSPOSITION;  Surgeon: Oris Krystal FALCON, MD;  Location: [facility];  Service: Vascular;  Laterality: Left;   CIRCUMCISION N/A 01/04/2014   Procedure: CIRCUMCISION ADULT (procedure #1);  Surgeon: Mohammad I Javaid, MD;  Location: [facility];  Service: Urology;  Laterality: N/A;   COLECTOMY N/A 05/04/2017   Procedure: TOTAL COLECTOMY;   Surgeon: Mavis Anes, MD;  Location: [facility] OR[facility];  Service: General;  Laterality: N/A;   COLONOSCOPY N/A 04/27/2017   Surgeon: Shaaron Lamar HERO, MD; annular mass in the ascending colon likely representing cancer biopsied, multiple 6-22 mm polyps removed, clean rectum.  Pathology with multiple tubular adenomas, high-grade dysplasia noted in ascending colon and splenic flexure.   CYSTOSCOPY W/ RETROGRADES Bilateral 06/29/2015   Procedure: CYSTOSCOPY, DILATION OF URETHRAL STRICTURE WITH BILATERAL RETROGRADE PYELOGRAM,SUPRAPUBIC TUBE CHANGE;  Surgeon: Donnice Brooks, MD;  Location: [facility] ORS;  Service: Urology;  Laterality: Bilateral;   CYSTOSCOPY WITH URETHRAL DILATATION N/A 12/29/2013   Procedure: CYSTOSCOPY WITH URETHRAL DILATATION;  Surgeon: Emery LILLETTE Blaze, MD;  Location: [facility];  Service: Urology;  Laterality: N/A;   ESOPHAGOGASTRODUODENOSCOPY N/A 04/27/2017   Procedure: ESOPHAGOGASTRODUODENOSCOPY (EGD);  Surgeon: Shaaron Lamar HERO, MD;  [facility]: [facility];  Service: Endoscopy;  Laterality: N/A;   FLEXIBLE SIGMOIDOSCOPY N/A 12/02/2021   Procedure: FLEXIBLE SIGMOIDOSCOPY;  Surgeon: Cindie Carlin POUR, DO;  Location: [facility];  Service: Endoscopy;  Laterality: N/A;  10:30am, dialysis patient   INSERTION OF DIALYSIS CATHETER Right 11/25/2018   Procedure: INSERTION OF DIALYSIS CATHETER RIGHT INTERNAL JUGULAR;  Surgeon: Sheree Penne Lonni, MD;  Location: [facility] OR;  Service: Vascular;  Laterality: Right;   IR AV DIALY SHUNT INTRO NEEDLE/INTRAC INITIAL W/PTA/STENT/IMG LT Left 02/16/2024   IR AV DIALY SHUNT INTRO NEEDLE/INTRACATH INITIAL W/PTA/IMG RIGHT Right 09/07/2018   IR FLUORO GUIDE CV LINE RIGHT  10/16/2020   IR FLUORO GUIDE CV LINE RIGHT  10/03/2022   IR REMOVAL TUN CV CATH W/O FL  01/12/2019   IR THROMBECTOMY AV FISTULA W/THROMBOLYSIS/PTA INC/SHUNT/IMG RIGHT Right 04/26/2018   IR US  GUIDE VASC ACCESS LEFT  02/17/2024   IR US  GUIDE VASC ACCESS RIGHT  04/26/2018   IR US  GUIDE VASC ACCESS  RIGHT  09/07/2018   IR US  GUIDE VASC ACCESS RIGHT  10/16/2020   IR US  GUIDE VASC ACCESS RIGHT  10/16/2020   IR US  GUIDE VASC ACCESS RIGHT  10/03/2022   ORIF FEMUR FRACTURE Right 11/22/2016   Procedure: OPEN REDUCTION INTERNAL FIXATION (ORIF) DISTAL FEMUR FRACTURE;  Surgeon: Redell Shoals, MD;  Location: [facility] OR;  Service: Orthopedics;  Laterality: Right;   PATCH ANGIOPLASTY Right 12/10/2017   Procedure: PATCH ANGIOPLASTY;  Surgeon: Blade Lonni GORMAN, MD;  Location: [facility] OR;  Service: Vascular;  Laterality: Right;   PERIPHERAL VASCULAR BALLOON ANGIOPLASTY  08/13/2018   Procedure: PERIPHERAL VASCULAR BALLOON ANGIOPLASTY;  Surgeon: Harvey Carlin BRAVO, MD;  Location: [facility] INVAS[facility] CV [facility];  Service: Cardiovascular;;  right AV fistula    PERIPHERAL VASCULAR BALLOON ANGIOPLASTY  11/22/2018   Procedure: PERIPHERAL VASCULAR BALLOON ANGIOPLASTY;  Surgeon: Sheree Penne Lonni, MD;  Location: [facility] INVASIVE CV LAB;  Service: Cardiovascular;;  rt AV fistula   PERIPHERAL VASCULAR BALLOON ANGIOPLASTY  10/08/2022   Procedure: PERIPHERAL VASCULAR BALLOON ANGIOPLASTY;  Surgeon: Lanis Fonda BRAVO, MD;  Location: [facility] INVASIVE CV LAB;  Service: Cardiovascular;;  arterial anastamosis  PERIPHERAL VASCULAR CATHETERIZATION N/A 10/08/2015   Procedure: A/V Shuntogram;  Surgeon: Lonni GORMAN Blade, MD;  Location: [facility] INVASIVE CV LAB;  Service: Cardiovascular;  Laterality: N/A;   PERIPHERAL VASCULAR INTERVENTION Left 10/08/2022   Procedure: PERIPHERAL VASCULAR INTERVENTION;  Surgeon: Lanis Fonda BRAVO, MD;  Location: [facility] INVASIVE CV LAB;  Service: Cardiovascular;  Laterality: Left;  left AVF   REMOVAL OF A DIALYSIS CATHETER N/A 12/03/2021   Procedure: MINOR REMOVAL OF A TUNNELED DIALYSIS CATHETER;  Surgeon: Oris Krystal FALCON, MD;  Location: [facility];  Service: Vascular;  Laterality: N/A;   REMOVAL OF A DIALYSIS CATHETER N/A 11/18/2022   Procedure: MINOR REMOVAL OF A TUNNELED DIALYSIS CATHETER;  Surgeon: Oris Krystal FALCON, MD;   Location: [facility];  Service: Vascular;  Laterality: N/A;   THROMBECTOMY W/ EMBOLECTOMY Right 12/10/2017   Procedure: THROMBECTOMY REVISION RIGHT ARM  ARTERIOVENOUS FISTULA;  Surgeon: Blade Lonni GORMAN, MD;  Location: [facility] OR;  Service: Vascular;  Laterality: Right;   TRANSURETHRAL RESECTION OF PROSTATE N/A 01/04/2014   Procedure: TRANSURETHRAL RESECTION OF THE PROSTATE (TURP) (procedure #2);  Surgeon: Mohammad I Javaid, MD;  Location: [facility];  Service: Urology;  Laterality: N/A;    Home Medications:  Allergies as of 05/06/2024   No Known Allergies      Medication List        Accurate as of May 06, 2024 10:12 AM. If you have any questions, ask your nurse or doctor.          aspirin  81 MG chewable tablet Chew 81 mg by mouth daily.   cyanocobalamin  1000 MCG tablet Commonly known as: VITAMIN B12 Take 1,000 mcg by mouth at bedtime.   FreeStyle Libre 2 Plus Sensor Misc by Does not apply route.   levothyroxine  150 MCG tablet Commonly known as: SYNTHROID  Take 150 mcg by mouth daily before breakfast.   midodrine  10 MG tablet Commonly known as: PROAMATINE  Take 10 mg by mouth See admin instructions. Send medication with resident on Tues/Thurs/Sat to dialysis. Do not administer at [facility], give to resident to take with him to dialysis. Send #3 tablets of 10mg  and #3 tablets of 5mg  to equal 15mg  per dose.   midodrine  5 MG tablet Commonly known as: PROAMATINE  Take 5 mg by mouth See admin instructions. Send medication with resident on Tues/Thurs/Sat to dialysis. Do not administer at [facility], give to resident to take with him to dialysis. Send #3 tablets of 5mg  and #3 tablets of 10mg  to equal 15mg  per dose.   OMEGA-3 FISH OIL PO Take 2 capsules by mouth at bedtime. Omega 3 - DHA - EPA - Fish oil 300-1000mg  capsules   rosuvastatin  20 MG tablet Commonly known as: CRESTOR  Take 20 mg by mouth at bedtime.   Semglee  (yfgn) 100 UNIT/ML Pen Generic drug: insulin   glargine-yfgn Inject 8 Units into the skin at bedtime.   sevelamer  carbonate 800 MG tablet Commonly known as: RENVELA  Take 3 tablets (2,400 mg total) by mouth 3 (three) times daily with meals.   tamsulosin  0.4 MG Caps capsule Commonly known as: FLOMAX  Take 0.4 mg by mouth at bedtime. Give 30 minutes after a meal. Do not crush or chew   torsemide  20 MG tablet Commonly known as: DEMADEX  Take 20 mg by mouth See admin instructions. Give 1 tablet (20mg ) by mouth once a day in the afternoon on Sunday, Monday, Wednesday, Friday.        Allergies: No Known Allergies  Family History: Family History  Problem Relation Age of  Onset   Cancer Mother    Colon cancer Neg Hx     Social History:  reports that he has never smoked. He has never used smokeless tobacco. He reports that he does not drink alcohol and does not use drugs.  ROS: All other review of systems were reviewed and are negative except what is noted above in HPI  Physical Exam: BP (!) 155/74   Pulse 81   Constitutional:  Alert and oriented, No acute distress. HEENT: [place] AT, moist mucus membranes.  Trachea midline, no masses. Cardiovascular: No clubbing, cyanosis, or edema. Respiratory: Normal respiratory effort, no increased work of breathing. GI: Abdomen is soft, nontender, nondistended, no abdominal masses GU: No CVA tenderness.  Lymph: No cervical or inguinal lymphadenopathy. Skin: No rashes, bruises or suspicious lesions. Neurologic: Grossly intact, no focal deficits, moving all 4 extremities. Psychiatric: Normal mood and affect.  Laboratory Data: Lab Results  Component Value Date   WBC 4.8 02/23/2024   HGB 8.8 (L) 02/23/2024   HCT 26.8 (L) 02/23/2024   MCV 105.9 (H) 02/23/2024   PLT 140 (L) 02/23/2024    Lab Results  Component Value Date   CREATININE 13.86 (H) 02/18/2024    No results found for: PSA  No results found for: TESTOSTERONE  Lab Results  Component Value Date   HGBA1C 6.0 (H)  03/24/2024    Urinalysis    Component Value Date/Time   COLORURINE AMBER (A) 06/12/2017 0615   APPEARANCEUR HAZY (A) 06/12/2017 0615   LABSPEC 1.021 06/12/2017 0615   PHURINE 5.0 06/12/2017 0615   GLUCOSEU NEGATIVE 06/12/2017 0615   HGBUR NEGATIVE 06/12/2017 0615   BILIRUBINUR NEGATIVE 06/12/2017 0615   KETONESUR NEGATIVE 06/12/2017 0615   PROTEINUR 30 (A) 06/12/2017 0615   UROBILINOGEN 0.2 07/20/2015 1850   NITRITE NEGATIVE 06/12/2017 0615   LEUKOCYTESUR NEGATIVE 06/12/2017 0615    Lab Results  Component Value Date   BACTERIA NONE SEEN 06/12/2017    Pertinent Imaging: *** No results found for this or any previous visit.  Results for orders placed during the hospital encounter of 10/01/11  US  Venous Img Lower Bilateral  Narrative *RADIOLOGY REPORT*  Clinical Data: Bilateral lower extremity edema, history diabetes  VENOUS DUPLEX ULTRASOUND OF BILATERAL LOWER EXTREMITIES  Technique:  Gray-scale sonography with graded compression, as well as color Doppler and duplex ultrasound, were performed to evaluate the deep venous system of both lower extremities from the level of the common femoral vein through the popliteal and proximal calf veins.  Spectral Doppler was utilized to evaluate flow at rest and with distal augmentation maneuvers.  Comparison:  None  Findings: Deep venous systems appear patent and compressible from groin to popliteal fossa bilaterally.  Spontaneous venous flow present with intact augmentation and evidence of respiratory phasicity.  No intraluminal thrombus identified.  Flow within the right popliteal vein is slightly sluggish.  Visualized portions of the greater saphenous systems appear patent.  IMPRESSION: No evidence of deep venous thrombosis in the lower extremities.  Original Report Authenticated By: ONEIL RONAL KISS, M.D.  No results found for this or any previous visit.  No results found for this or any previous visit.  Results for  orders placed during the hospital encounter of 04/03/16  US  Renal  Narrative CLINICAL DATA:  Pain from catheter.  Renal insufficiency.  EXAM: RENAL / URINARY TRACT ULTRASOUND COMPLETE  COMPARISON:  CT abdomen dated 03/10/2016. Also abdomen ultrasound dated 05/24/2015.  FINDINGS: Right Kidney:  Length: 11.4 cm. Increased cortical echogenicity, similar to  the previous abdomen ultrasound. Stable mild hydronephrosis.  Left Kidney:  Length: 10 cm. Increased renal cortex echogenicity. Benign-appearing cyst within the midpole region of the left kidney, measuring approximately 2.8 cm greatest dimension, probably stable given differences in measurement technique. No hydronephrosis.  Bladder:  Bladder decompressed by Foley catheter. Bladder wall thickness measured at 1.3 cm, probably accentuated to some degree by the bladder decompression.  IMPRESSION: 1. Bladder decompressed by Foley catheter. Bladder wall appears thickened, measured at 1.3 cm thickness, probably accentuated to some degree by the bladder decompression. Recommend correlation with urinalysis to exclude associated cystitis. 2. Stable mild right-sided hydronephrosis. 3. Echogenic renal cortices bilaterally suggesting chronic medical renal disease. These results will be called to the ordering clinician or representative by the Radiologist Assistant, and communication documented in the PACS or zVision Dashboard.   Electronically Signed By: Lael Hines M.D. On: 04/03/2016 13:37  No results found for this or any previous visit.  No results found for this or any previous visit.  No results found for this or any previous visit.   Assessment & Plan:    1. Elevated PSA (Primary) Followup 6 months with psa - Urinalysis, Routine w reflex microscopic   No follow-ups on file.  Belvie Clara, MD  [facility] [place]

## 2024-05-07 DIAGNOSIS — D509 Iron deficiency anemia, unspecified: Secondary | ICD-10-CM | POA: Diagnosis not present

## 2024-05-07 DIAGNOSIS — N186 End stage renal disease: Secondary | ICD-10-CM | POA: Diagnosis not present

## 2024-05-07 DIAGNOSIS — N250 Renal osteodystrophy: Secondary | ICD-10-CM | POA: Diagnosis not present

## 2024-05-07 DIAGNOSIS — Z992 Dependence on renal dialysis: Secondary | ICD-10-CM | POA: Diagnosis not present

## 2024-05-07 DIAGNOSIS — D631 Anemia in chronic kidney disease: Secondary | ICD-10-CM | POA: Diagnosis not present

## 2024-05-10 ENCOUNTER — Encounter: Payer: Self-pay | Admitting: Urology

## 2024-05-10 ENCOUNTER — Non-Acute Institutional Stay (SKILLED_NURSING_FACILITY): Payer: Self-pay | Admitting: Internal Medicine

## 2024-05-10 DIAGNOSIS — D509 Iron deficiency anemia, unspecified: Secondary | ICD-10-CM | POA: Diagnosis not present

## 2024-05-10 DIAGNOSIS — E034 Atrophy of thyroid (acquired): Secondary | ICD-10-CM

## 2024-05-10 DIAGNOSIS — E538 Deficiency of other specified B group vitamins: Secondary | ICD-10-CM

## 2024-05-10 DIAGNOSIS — R338 Other retention of urine: Secondary | ICD-10-CM

## 2024-05-10 DIAGNOSIS — N186 End stage renal disease: Secondary | ICD-10-CM

## 2024-05-10 DIAGNOSIS — N401 Enlarged prostate with lower urinary tract symptoms: Secondary | ICD-10-CM | POA: Diagnosis not present

## 2024-05-10 DIAGNOSIS — E1122 Type 2 diabetes mellitus with diabetic chronic kidney disease: Secondary | ICD-10-CM | POA: Diagnosis not present

## 2024-05-10 DIAGNOSIS — D631 Anemia in chronic kidney disease: Secondary | ICD-10-CM | POA: Diagnosis not present

## 2024-05-10 DIAGNOSIS — Z992 Dependence on renal dialysis: Secondary | ICD-10-CM | POA: Diagnosis not present

## 2024-05-10 DIAGNOSIS — N250 Renal osteodystrophy: Secondary | ICD-10-CM | POA: Diagnosis not present

## 2024-05-10 NOTE — Assessment & Plan Note (Signed)
 He continues hemodialysis 3 times a week on Tuesday, Thursday, and Saturday.  Current A1c is 6% as of 03/24/2024.  Some discordance can be anticipated in reference to the A1c and glucoses because of the ESRD.

## 2024-05-10 NOTE — Assessment & Plan Note (Signed)
 This is been associated with right hydronephrosis as well as elevation of the PSA.  PSA has dropped from 8.02 down to a current value of 4.91.  Urologist, Dr. Sherrilee will recheck this in 6 months.  Clinically there is no evidence of any urinary retention at this time.

## 2024-05-10 NOTE — Assessment & Plan Note (Signed)
 Macrocytic anemia is stable serially and B12 level was supranormal.  There is no current folate on record but no nutritional issues have been identified at the SNF.

## 2024-05-10 NOTE — Progress Notes (Unsigned)
 NURSING HOME LOCATION:  [facility] ROOM NUMBER:  [id number]  CODE STATUS:  Full Code  PCP: Landy Barnie RAMAN, NP   This is a nursing facility follow up visit for of chronic medical diagnoses to document compliance with Regulation 483.30 (c) in The Long Term Care Survey Manual Phase 2 which mandates caregiver visit ( visits can alternate among physician, PA or NP as per statutes) within 10 days of 30 days / 60 days/ 90 days post admission to SNF date  .  Interim medical record and care since last SNF visit was updated with review of diagnostic studies and change in clinical status since last visit were documented.  HPI: He is permanent resident of this facility with medical diagnoses of end-stage renal disease on hemodialysis 3 times weekly;hypothyroidism; dyslipidemia; history of hydronephrosis; B12 deficiency; and insulin -dependent diabetes. Urology follow-up was recently completed to assess elevated PSA.  PSA has dropped from 8.02 down to 4.91; follow-up in 6 months was recommended. His macrocytic anemia is stable and the most recent B12 level was supranormal at 2241.  There is no current folate on record.  Review of systems: He denies any active symptoms with special reference to the GU system.  He did not seem to comprehend what was recommended by the Urologist.  Responses tend to be a mumbled monosyllabic no.  Constitutional: No fever, significant weight change, fatigue  Eyes: No redness, discharge, pain, vision change ENT/mouth: No nasal congestion,  purulent discharge, earache, change in hearing, sore throat  Cardiovascular: No chest pain, palpitations, paroxysmal nocturnal dyspnea, claudication, edema  Respiratory: No cough, sputum production, hemoptysis, DOE, significant snoring, apnea   Gastrointestinal: No heartburn, dysphagia, abdominal pain, nausea /vomiting, rectal bleeding, melena, change in bowels Genitourinary: No dysuria, hematuria, pyuria, incontinence,  nocturia Musculoskeletal: No joint stiffness, joint swelling, weakness, pain Dermatologic: No rash, pruritus, change in appearance of skin Neurologic: No dizziness, headache, syncope, seizures, numbness, tingling Psychiatric: No significant anxiety, depression, insomnia, anorexia Endocrine: No change in hair/skin/nails, excessive thirst, excessive hunger, excessive urination  Hematologic/lymphatic: No significant bruising, lymphadenopathy, abnormal bleeding Allergy/immunology: No itchy/watery eyes, significant sneezing, urticaria, angioedema  Physical exam:  Pertinent or positive findings: Slight exotropia of the right eye is suggested.  Slight arcus senilis and slight scleral injection is suggested without active infection.  As noted speech is somewhat mumbled and difficult to discern.  There is a trace of blood over the upper lip; he states that he nicked himself shaving.  He denies any bleeding dyscrasias.  The maxilla is edentulous.  There is marked plaque formation of the lower teeth.  Grade 1/2 systolic murmur is present.  There is accentuation of the second heart sound.  Pedal pulses are decreased.  Strength to opposition is excellent.  General appearance: Adequately nourished; no acute distress, increased work of breathing is present.   Lymphatic: No lymphadenopathy about the head, neck, axilla. Eyes: No conjunctival inflammation or lid edema is present. There is no scleral icterus. Ears:  External ear exam shows no significant lesions or deformities.   Nose:  External nasal examination shows no deformity or inflammation. Nasal mucosa are pink and moist without lesions, exudates Neck:  No thyromegaly, masses, tenderness noted.    Heart:  No gallop, click, rub .  Lungs: Chest clear to auscultation without wheezes, rhonchi, rales, rubs. Abdomen: Bowel sounds are normal. Abdomen is soft and nontender with no organomegaly, hernias, masses. GU: Deferred  Extremities:  No cyanosis, clubbing,  edema  Skin: Warm &  dry w/o tenting. No significant lesions or rash.  See summary under each active problem in the Problem List with associated updated therapeutic plan

## 2024-05-10 NOTE — Patient Instructions (Signed)

## 2024-05-10 NOTE — Assessment & Plan Note (Signed)
 TSH is therapeutic; no change in L-thyroxine dose indicated.  Continue to monitor.

## 2024-05-10 NOTE — Patient Instructions (Signed)
 See assessment and plan under each diagnosis in the problem list and acutely for this visit

## 2024-05-11 ENCOUNTER — Encounter: Payer: Self-pay | Admitting: Internal Medicine

## 2024-05-12 DIAGNOSIS — Z992 Dependence on renal dialysis: Secondary | ICD-10-CM | POA: Diagnosis not present

## 2024-05-12 DIAGNOSIS — D509 Iron deficiency anemia, unspecified: Secondary | ICD-10-CM | POA: Diagnosis not present

## 2024-05-12 DIAGNOSIS — N186 End stage renal disease: Secondary | ICD-10-CM | POA: Diagnosis not present

## 2024-05-12 DIAGNOSIS — D631 Anemia in chronic kidney disease: Secondary | ICD-10-CM | POA: Diagnosis not present

## 2024-05-12 DIAGNOSIS — N250 Renal osteodystrophy: Secondary | ICD-10-CM | POA: Diagnosis not present

## 2024-05-14 DIAGNOSIS — N250 Renal osteodystrophy: Secondary | ICD-10-CM | POA: Diagnosis not present

## 2024-05-14 DIAGNOSIS — D631 Anemia in chronic kidney disease: Secondary | ICD-10-CM | POA: Diagnosis not present

## 2024-05-14 DIAGNOSIS — Z992 Dependence on renal dialysis: Secondary | ICD-10-CM | POA: Diagnosis not present

## 2024-05-14 DIAGNOSIS — D509 Iron deficiency anemia, unspecified: Secondary | ICD-10-CM | POA: Diagnosis not present

## 2024-05-14 DIAGNOSIS — N186 End stage renal disease: Secondary | ICD-10-CM | POA: Diagnosis not present

## 2024-05-17 DIAGNOSIS — D631 Anemia in chronic kidney disease: Secondary | ICD-10-CM | POA: Diagnosis not present

## 2024-05-17 DIAGNOSIS — N186 End stage renal disease: Secondary | ICD-10-CM | POA: Diagnosis not present

## 2024-05-17 DIAGNOSIS — N250 Renal osteodystrophy: Secondary | ICD-10-CM | POA: Diagnosis not present

## 2024-05-17 DIAGNOSIS — Z992 Dependence on renal dialysis: Secondary | ICD-10-CM | POA: Diagnosis not present

## 2024-05-17 DIAGNOSIS — D509 Iron deficiency anemia, unspecified: Secondary | ICD-10-CM | POA: Diagnosis not present

## 2024-05-19 DIAGNOSIS — D631 Anemia in chronic kidney disease: Secondary | ICD-10-CM | POA: Diagnosis not present

## 2024-05-19 DIAGNOSIS — N250 Renal osteodystrophy: Secondary | ICD-10-CM | POA: Diagnosis not present

## 2024-05-19 DIAGNOSIS — Z992 Dependence on renal dialysis: Secondary | ICD-10-CM | POA: Diagnosis not present

## 2024-05-19 DIAGNOSIS — D509 Iron deficiency anemia, unspecified: Secondary | ICD-10-CM | POA: Diagnosis not present

## 2024-05-19 DIAGNOSIS — N186 End stage renal disease: Secondary | ICD-10-CM | POA: Diagnosis not present

## 2024-05-21 DIAGNOSIS — N250 Renal osteodystrophy: Secondary | ICD-10-CM | POA: Diagnosis not present

## 2024-05-21 DIAGNOSIS — D509 Iron deficiency anemia, unspecified: Secondary | ICD-10-CM | POA: Diagnosis not present

## 2024-05-21 DIAGNOSIS — D631 Anemia in chronic kidney disease: Secondary | ICD-10-CM | POA: Diagnosis not present

## 2024-05-21 DIAGNOSIS — N186 End stage renal disease: Secondary | ICD-10-CM | POA: Diagnosis not present

## 2024-05-21 DIAGNOSIS — Z992 Dependence on renal dialysis: Secondary | ICD-10-CM | POA: Diagnosis not present

## 2024-05-23 DIAGNOSIS — F70 Mild intellectual disabilities: Secondary | ICD-10-CM | POA: Diagnosis not present

## 2024-05-23 DIAGNOSIS — F4322 Adjustment disorder with anxiety: Secondary | ICD-10-CM | POA: Diagnosis not present

## 2024-05-24 DIAGNOSIS — D509 Iron deficiency anemia, unspecified: Secondary | ICD-10-CM | POA: Diagnosis not present

## 2024-05-24 DIAGNOSIS — N250 Renal osteodystrophy: Secondary | ICD-10-CM | POA: Diagnosis not present

## 2024-05-24 DIAGNOSIS — N186 End stage renal disease: Secondary | ICD-10-CM | POA: Diagnosis not present

## 2024-05-24 DIAGNOSIS — D631 Anemia in chronic kidney disease: Secondary | ICD-10-CM | POA: Diagnosis not present

## 2024-05-24 DIAGNOSIS — Z992 Dependence on renal dialysis: Secondary | ICD-10-CM | POA: Diagnosis not present

## 2024-05-26 ENCOUNTER — Non-Acute Institutional Stay (SKILLED_NURSING_FACILITY): Payer: Self-pay | Admitting: Adult Health

## 2024-05-26 ENCOUNTER — Encounter: Payer: Self-pay | Admitting: Adult Health

## 2024-05-26 DIAGNOSIS — I120 Hypertensive chronic kidney disease with stage 5 chronic kidney disease or end stage renal disease: Secondary | ICD-10-CM

## 2024-05-26 DIAGNOSIS — E1122 Type 2 diabetes mellitus with diabetic chronic kidney disease: Secondary | ICD-10-CM | POA: Diagnosis not present

## 2024-05-26 DIAGNOSIS — D696 Thrombocytopenia, unspecified: Secondary | ICD-10-CM | POA: Diagnosis not present

## 2024-05-26 DIAGNOSIS — Z992 Dependence on renal dialysis: Secondary | ICD-10-CM

## 2024-05-26 DIAGNOSIS — I700 Atherosclerosis of aorta: Secondary | ICD-10-CM | POA: Diagnosis not present

## 2024-05-26 DIAGNOSIS — N186 End stage renal disease: Secondary | ICD-10-CM | POA: Diagnosis not present

## 2024-05-26 DIAGNOSIS — D631 Anemia in chronic kidney disease: Secondary | ICD-10-CM | POA: Diagnosis not present

## 2024-05-26 DIAGNOSIS — D509 Iron deficiency anemia, unspecified: Secondary | ICD-10-CM | POA: Diagnosis not present

## 2024-05-26 DIAGNOSIS — N250 Renal osteodystrophy: Secondary | ICD-10-CM | POA: Diagnosis not present

## 2024-05-26 NOTE — Progress Notes (Signed)
 Location:  [facility] Room Number: 112-D Place of Service:  SNF (31)   CODE STATUS: full   No Known Allergies  Chief Complaint  Patient presents with   Care plan meeting    Acute visit.    HPI:  We have come together for her care plan meeting. BIMS 6/15 mood 0/30. Uses wheelchair without falls. He requires independent assist with his adl care. Is occasionally incontinent of bladder and is continent of bowel. Remains on hemodialysis three days per week. Dietary: feeds self; regular diet: appetite 76-100%; weight is 204.8 pounds has had a 6 pound weight loss in the past 3 months which is desirable weight. Therapy: none at this time. Activities: is participating more. 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  Aortic atherosclerosis  Thrombocytopenia  Past Medical History:  Diagnosis Date   Abnormal CT scan, kidney 10/06/2011   Acute pyelonephritis 10/07/2011   Anemia    normocytic   Anxiety    mental retardation   Bladder wall thickening 10/06/2011   BPH (benign prostatic hypertrophy)    Diabetes mellitus    Dialysis patient (HCC)    Tuesday, Thursday and Saturday,    DVT of leg (deep venous thrombosis) (HCC) 12/25/2016   Edema     history of lower extremity edema   GERD (gastroesophageal reflux disease)    Heme positive stool    Hydronephrosis    Hyperkalemia    Hyperlipidemia    Hypernatremia    Hypertension    Hypothyroidism    Impaired speech    Infected prosthetic vascular graft (HCC)    MR (mental retardation)    Muscle weakness    Obstructive uropathy    Perinephric abscess 10/07/2011   Poor historian poor historian   Primary colorectal adenocarcinoma (HCC) 04/2017   S/p total colectomy   Protein calorie malnutrition (HCC)    Pyelonephritis    Renal failure (ARF), acute on chronic (HCC)    Renal insufficiency    chronic  history   Sepsis (HCC)    Smoking    Uremia    Urinary retention    UTI (lower urinary tract infection) 10/06/2011    Past Surgical History:  Procedure Laterality Date   A/V FISTULAGRAM N/A 08/13/2018   Procedure: A/V FISTULAGRAM - Right Upper;  Surgeon: Harvey Carlin BRAVO, MD;  Location: [facility] INVASIVE CV LAB;  Service: Cardiovascular;  Laterality: N/A;   A/V FISTULAGRAM N/A 11/22/2018   Procedure: A/V FISTULAGRAM - Right Upper;  Surgeon: Sheree Penne Bruckner, MD;  Location: [facility] INVASIVE CV LAB;  Service: Cardiovascular;  Laterality: N/A;   A/V FISTULAGRAM Left 10/08/2022   Procedure: A/V Fistulagram;  Surgeon: Lanis Fonda BRAVO, MD;  Location: [facility] INVASIVE CV LAB;  Service: Cardiovascular;  Laterality: Left;   A/V FISTULAGRAM Left 05/06/2023   Procedure: A/V Fistulagram;  Surgeon: Magda Debby SAILOR, MD;  Location: [facility] INVASIVE CV LAB;  Service: Cardiovascular;  Laterality: Left;   AV FISTULA PLACEMENT Left 07/06/2015   Procedure:  INSERTION LEFT ARM ARTERIOVENOUS GORTEX GRAFT;  Surgeon: Bruckner GORMAN Blade, MD;  Location: [facility] OR;  Service: Vascular;  Laterality: Left;   AV FISTULA PLACEMENT Right 02/26/2016   Procedure: ARTERIOVENOUS (AV) FISTULA CREATION ;  Surgeon: Bruckner GORMAN Blade, MD;  Location: [facility] OR;  Service: Vascular;  Laterality: Right;   AV FISTULA PLACEMENT Right 11/25/2018   Procedure: INSERTION OF ARTERIOVENOUS (AV) ARTEGRAFT  RIGHT UPPER ARM;  Surgeon: Sheree Penne Bruckner, MD;  Location: [facility] OR;  Service: Vascular;  Laterality: Right;   AV FISTULA PLACEMENT Left 05/28/2021   Procedure: LEFT ARM ARTERIOVENOUS (AV) FISTULA;  Surgeon: Oris Krystal FALCON, MD;  Location: [facility] ORS;  Service: Vascular;  Laterality: Left;   AVGG REMOVAL Left 10/09/2015   Procedure: REMOVAL OF ARTERIOVENOUS GORETEX GRAFT (AVGG) Evacuation of Lymphocele, Vein Patch angioplasty of brachial artery.;  Surgeon: Bruckner GORMAN Blade, MD;  Location: [facility] OR;  Service: Vascular;  Laterality: Left;   BASCILIC  VEIN TRANSPOSITION Right 02/26/2016   Procedure: Right BASCILIC VEIN TRANSPOSITION;  Surgeon: Bruckner GORMAN Blade, MD;  Location: [facility] OR;  Service: Vascular;  Laterality: Right;   BASCILIC VEIN TRANSPOSITION Left 07/30/2021   Procedure: LEFT ARM SECOND STAGE BASILIC VEIN TRANSPOSITION;  Surgeon: Oris Krystal FALCON, MD;  Location: [facility] ORS;  Service: Vascular;  Laterality: Left;   CIRCUMCISION N/A 01/04/2014   Procedure: CIRCUMCISION ADULT (procedure #1);  Surgeon: Mohammad I Javaid, MD;  Location: [facility] ORS;  Service: Urology;  Laterality: N/A;   COLECTOMY N/A 05/04/2017   Procedure: TOTAL COLECTOMY;  Surgeon: Mavis Anes, MD;  Location: [facility] ORS;  Service: General;  Laterality: N/A;   COLONOSCOPY N/A 04/27/2017   Surgeon: Shaaron Lamar HERO, MD; annular mass in the ascending colon likely representing cancer biopsied, multiple 6-22 mm polyps removed, clean rectum.  Pathology with multiple tubular adenomas, high-grade dysplasia noted in ascending colon and splenic flexure.   CYSTOSCOPY W/ RETROGRADES Bilateral 06/29/2015   Procedure: CYSTOSCOPY, DILATION OF URETHRAL STRICTURE WITH BILATERAL RETROGRADE PYELOGRAM,SUPRAPUBIC TUBE CHANGE;  Surgeon: Donnice Brooks, MD;  Location: [facility] ORS;  Service: Urology;  Laterality: Bilateral;   CYSTOSCOPY WITH URETHRAL DILATATION N/A 12/29/2013   Procedure: CYSTOSCOPY WITH URETHRAL DILATATION;  Surgeon: Emery LILLETTE Blaze, MD;  Location: [facility] ORS;  Service: Urology;  Laterality: N/A;   ESOPHAGOGASTRODUODENOSCOPY N/A 04/27/2017   Procedure: ESOPHAGOGASTRODUODENOSCOPY (EGD);  Surgeon: Shaaron Lamar HERO, MD;  Location: [facility];  Service: Endoscopy;  Laterality: N/A;   FLEXIBLE SIGMOIDOSCOPY N/A 12/02/2021   Procedure: FLEXIBLE SIGMOIDOSCOPY;  Surgeon: Cindie Carlin POUR, DO;  Location: [facility] END[facility];  Service: Endoscopy;  Laterality: N/A;  10:30am, dialysis patient   INSERTION OF DIALYSIS CATHETER Right 11/25/2018   Procedure: INSERTION OF DIALYSIS CATHETER RIGHT INTERNAL JUGULAR;   Surgeon: Sheree Penne Bruckner, MD;  Location: [facility] OR;  Service: Vascular;  Laterality: Right;   IR AV DIALY SHUNT INTRO NEEDLE/INTRAC INITIAL W/PTA/STENT/IMG LT Left 02/16/2024   IR AV DIALY SHUNT INTRO NEEDLE/INTRACATH INITIAL W/PTA/IMG RIGHT Right 09/07/2018   IR FLUORO GUIDE CV LINE RIGHT  10/16/2020   IR FLUORO GUIDE CV LINE RIGHT  10/03/2022   IR REMOVAL TUN CV CATH W/O FL  01/12/2019   IR THROMBECTOMY AV FISTULA W/THROMBOLYSIS/PTA INC/SHUNT/IMG RIGHT Right 04/26/2018   IR US  GUIDE VASC ACCESS LEFT  02/17/2024   IR US  GUIDE VASC ACCESS RIGHT  04/26/2018   IR US  GUIDE VASC ACCESS RIGHT  09/07/2018   IR US  GUIDE VASC ACCESS RIGHT  10/16/2020   IR US  GUIDE VASC ACCESS RIGHT  10/16/2020   IR US  GUIDE VASC ACCESS RIGHT  10/03/2022   ORIF FEMUR FRACTURE Right 11/22/2016   Procedure: OPEN REDUCTION INTERNAL FIXATION (ORIF) DISTAL FEMUR FRACTURE;  Surgeon: Redell Shoals, MD;  Location: [facility] OR;  Service: Orthopedics;  Laterality: Right;   PATCH ANGIOPLASTY Right 12/10/2017   Procedure: PATCH ANGIOPLASTY;  Surgeon: Blade Bruckner GORMAN, MD;  Location: [facility] OR;  Service: Vascular;  Laterality: Right;  PERIPHERAL VASCULAR BALLOON ANGIOPLASTY  08/13/2018   Procedure: PERIPHERAL VASCULAR BALLOON ANGIOPLASTY;  Surgeon: Harvey Carlin BRAVO, MD;  Location: [facility] INVASIVE CV LAB;  Service: Cardiovascular;;  right AV fistula    PERIPHERAL VASCULAR BALLOON ANGIOPLASTY  11/22/2018   Procedure: PERIPHERAL VASCULAR BALLOON ANGIOPLASTY;  Surgeon: Sheree Penne Bruckner, MD;  Location: [facility] INVASIVE CV LAB;  Service: Cardiovascular;;  rt AV fistula   PERIPHERAL VASCULAR BALLOON ANGIOPLASTY  10/08/2022   Procedure: PERIPHERAL VASCULAR BALLOON ANGIOPLASTY;  Surgeon: Lanis Fonda BRAVO, MD;  Location: [facility] INVASIVE CV LAB;  Service: Cardiovascular;;  arterial anastamosis   PERIPHERAL VASCULAR CATHETERIZATION N/A 10/08/2015   Procedure: A/V Shuntogram;  Surgeon: Bruckner GORMAN Blade, MD;  Location: [facility] INVASIVE CV LAB;   Service: Cardiovascular;  Laterality: N/A;   PERIPHERAL VASCULAR INTERVENTION Left 10/08/2022   Procedure: PERIPHERAL VASCULAR INTERVENTION;  Surgeon: Lanis Fonda BRAVO, MD;  Location: [facility] INVASIVE CV LAB;  Service: Cardiovascular;  Laterality: Left;  left AVF   REMOVAL OF A DIALYSIS CATHETER N/A 12/03/2021   Procedure: MINOR REMOVAL OF A TUNNELED DIALYSIS CATHETER;  Surgeon: Oris Krystal FALCON, MD;  Location: [facility];  Service: Vascular;  Laterality: N/A;   REMOVAL OF A DIALYSIS CATHETER N/A 11/18/2022   Procedure: MINOR REMOVAL OF A TUNNELED DIALYSIS CATHETER;  Surgeon: Oris Krystal FALCON, MD;  Location: [facility]S;  Service: Vascular;  Laterality: N/A;   THROMBECTOMY W/ EMBOLECTOMY Right 12/10/2017   Procedure: THROMBECTOMY REVISION RIGHT ARM  ARTERIOVENOUS FISTULA;  Surgeon: Blade Bruckner GORMAN, MD;  Location: [facility] OR;  Service: Vascular;  Laterality: Right;   TRANSURETHRAL RESECTION OF PROSTATE N/A 01/04/2014   Procedure: TRANSURETHRAL RESECTION OF THE PROSTATE (TURP) (procedure #2);  Surgeon: Mohammad I Javaid, MD;  Location: [facility] ORS;  Service: Urology;  Laterality: N/A;    Social History   Socioeconomic History   Marital status: Single    Spouse name: Not on file   Number of children: Not on file   Years of education: Not on file   Highest education level: Not on file  Occupational History   Occupation: retired   Tobacco Use   Smoking status: Never   Smokeless tobacco: Never  Vaping Use   Vaping status: Never Used  Substance and Sexual Activity   Alcohol use: No   Drug use: No   Sexual activity: Not Currently  Other Topics Concern   Not on file  Social History Narrative   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   [facility] resident [facility]    Social Drivers of Health   Financial Resource Strain: Low Risk  (02/18/2018)   Overall Financial Resource Strain (CARDIA)    Difficulty of Paying Living Expenses: Not hard at all  Food Insecurity: No Food  Insecurity (02/17/2024)   Hunger Vital Sign    Worried About Running Out of Food in the Last Year: Never true    Ran Out of Food in the Last Year: Never true  Transportation Needs: No Transportation Needs (02/17/2024)   PRAPARE - Administrator, Civil Service (Medical): No    Lack of Transportation (Non-Medical): No  Physical Activity: Unknown (09/15/2019)   Exercise Vital Sign    Days of Exercise per Week: Not on file    Minutes of Exercise per Session: 0 min  Stress: No Stress Concern Present (02/18/2018)   [employer] of Occupational Health - Occupational Stress Questionnaire    Feeling of Stress : Not at all  Social Connections: Socially Isolated (02/17/2024)  Social Connection and Isolation Panel    Frequency of Communication with Friends and Family: Three times a week    Frequency of Social Gatherings with Friends and Family: Once a week    Attends Religious Services: Never    Database administrator or Organizations: No    Attends Banker Meetings: Never    Marital Status: Never married  Intimate Partner Violence: Not At Risk (02/17/2024)   Humiliation, Afraid, Rape, and Kick questionnaire    Fear of Current or Ex-Partner: No    Emotionally Abused: No    Physically Abused: No    Sexually Abused: No   Family History  Problem Relation Age of Onset   Cancer Mother    Colon cancer Neg Hx       VITAL SIGNS BP 122/67   Pulse 80   Temp 98.6 F (37 C)   Ht 5' 9 (1.753 m)   Wt 204 lb 12.8 oz (92.9 kg)   SpO2 100%   BMI 30.24 kg/m   Outpatient Encounter Medications as of 05/26/2024  Medication Sig Note   aspirin  81 MG chewable tablet Chew 81 mg by mouth daily.    Continuous Glucose Sensor (FREESTYLE LIBRE 2 PLUS SENSOR) MISC by Does not apply route.    insulin  glargine-yfgn (SEMGLEE , YFGN,) 100 UNIT/ML Pen Inject 8 Units into the skin at bedtime.    levothyroxine  (SYNTHROID ) 150 MCG tablet Take 150 mcg by mouth daily before breakfast.     midodrine  (PROAMATINE ) 10 MG tablet Take 10 mg by mouth See admin instructions. Send medication with resident on Tues/Thurs/Sat to dialysis. Do not administer at [facility], give to resident to take with him to dialysis. 02/17/2024: Confirmed per [facility] - pt only receives while at dialysis on Tues/Thurs/Sat. Pt receives 2 strengths of midodrine  to equal 15mg  per dose.   midodrine  (PROAMATINE ) 5 MG tablet Take 5 mg by mouth See admin instructions. Send medication with resident on Tues/Thurs/Sat to dialysis. Do not administer at [facility], give to resident to take with him to dialysis.    Omega-3 Fatty Acids (OMEGA-3 FISH OIL PO) Take 2 capsules by mouth at bedtime. Omega 3 - DHA - EPA - Fish oil 300-1000mg  capsules    rosuvastatin  (CRESTOR ) 20 MG tablet Take 20 mg by mouth at bedtime.    sevelamer  carbonate (RENVELA ) 800 MG tablet Take 3 tablets (2,400 mg total) by mouth 3 (three) times daily with meals.    tamsulosin  (FLOMAX ) 0.4 MG CAPS capsule Take 0.4 mg by mouth at bedtime. Give 30 minutes after a meal. Do not crush or chew    torsemide  (DEMADEX ) 20 MG tablet Take 20 mg by mouth See admin instructions. Give 1 tablet (20mg ) by mouth once a day in the afternoon on Sunday, Monday, Wednesday, Friday.    UNABLE TO FIND Med Name: BD AutoShield Duo pen needle (30g x 3/16 needle)    vitamin B-12 (CYANOCOBALAMIN ) 1000 MCG tablet Take 1,000 mcg by mouth at bedtime. (Patient not taking: Reported on 06/01/2024)    No facility-administered encounter medications on file as of 05/26/2024.     SIGNIFICANT DIAGNOSTIC EXAMS  LABS REVIEWED PREVIOUS;       06-11-23: wbc 4.4; hgb 11.7; hct 35.2; mcv 102.3 plt 88; glucose 160; bun 20; creat 4.71; k+ 3.5; na++ 137; ca 8.4; gfr 12   06-13-23: wbc 3.6; hgb 13.0; hct 39.2; mcv 102.6 plt 110; glucose 103; bun 11; creat 4.13; k+ 3.3; na++ 138; ca 8.6; gfr 14;  d-dimer 0.54; CRP <0.5  06-16-23: glucose 99; bun 40; creat 10.88; k+ 4.6; na++ 136; ca 8.8; gfr 5 07-16-23: hgb A1c  5.7; chol 134; ldl 39; trig 486; hdl 24 09-03-23: tsh 3.852 free t4: 0.82 09-14-23: protein 6.9 albumin  3.2; chol 127; ldl 37 trig 420 hdl 25 12-17-23: wbc 3.9; hgb 10.6; hct 30.5; mcv 103.7 plt 110; hgb A1c 6.6; PSA 8.02; chol 118; ldl 45; trig 222; hdl 29; tsh 10.663 02-16-24: wbc 4.3; hgb 9.3; hct 27.9; mcv 104.5 plt 81; glucose 138; bun 80; creat 17.85; k+ 5.8; na++ 134; ca 9.3 gfr 2; protein 7.3 albumin  3.4 02-18-24: wbc 3.9; hgb 8.7; hct 25.5; mcv 102.4 plt 78; glucose 133; bun 52; creat 13.86; k+ 4.7; na++ 137; ca 8.7; gfr 3; protein 6.6 albumin  3.1 mag 2.5 phos 6.9  02-23-24: wbc 4.8; hgb 8.8; hct 26.8; mcv 105.9 plt 140; tsh 2.608; vitamin B 12: 4099 02-26-24:vitamin B12: 2246 03-24-24: hgb A1c 6.0 04-15-24: PSA 4.91    NO NEW LABS.   Review of Systems  Constitutional:  Negative for malaise/fatigue.  Respiratory:  Negative for cough and shortness of breath.   Cardiovascular:  Negative for chest pain, palpitations and leg swelling.  Gastrointestinal:  Negative for abdominal pain, constipation and heartburn.  Musculoskeletal:  Negative for back pain, joint pain and myalgias.  Skin: Negative.   Neurological:  Negative for dizziness.  Psychiatric/Behavioral:  The patient is not nervous/anxious.     Physical Exam Constitutional:      General: He is not in acute distress.    Appearance: He is well-developed. He is not diaphoretic.  Neck:     Thyroid : No thyromegaly.  Cardiovascular:     Rate and Rhythm: Normal rate and regular rhythm.     Heart sounds: Normal heart sounds.  Pulmonary:     Effort: Pulmonary effort is normal. No respiratory distress.     Breath sounds: Normal breath sounds.  Abdominal:     General: Bowel sounds are normal. There is no distension.     Palpations: Abdomen is soft.     Tenderness: There is no abdominal tenderness.  Musculoskeletal:        General: Normal range of motion.     Cervical back: Neck supple.     Right lower leg: No edema.     Left lower  leg: No edema.  Lymphadenopathy:     Cervical: No cervical adenopathy.  Skin:    General: Skin is warm and dry.     Comments: Left arm fistula +thrill/bruit   Neurological:     Mental Status: He is alert. Mental status is at baseline.     Comments:   SLUMS 2/30  Psychiatric:        Mood and Affect: Mood normal.     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   Time spent with patient: dietary; medications; activities.    Barnie Seip NP [facility]  call [phone]

## 2024-05-27 DIAGNOSIS — N250 Renal osteodystrophy: Secondary | ICD-10-CM | POA: Diagnosis not present

## 2024-05-27 DIAGNOSIS — N2581 Secondary hyperparathyroidism of renal origin: Secondary | ICD-10-CM | POA: Diagnosis not present

## 2024-05-27 DIAGNOSIS — D631 Anemia in chronic kidney disease: Secondary | ICD-10-CM | POA: Diagnosis not present

## 2024-05-27 DIAGNOSIS — Z992 Dependence on renal dialysis: Secondary | ICD-10-CM | POA: Diagnosis not present

## 2024-05-27 DIAGNOSIS — D509 Iron deficiency anemia, unspecified: Secondary | ICD-10-CM | POA: Diagnosis not present

## 2024-05-27 DIAGNOSIS — N186 End stage renal disease: Secondary | ICD-10-CM | POA: Diagnosis not present

## 2024-05-28 DIAGNOSIS — N2581 Secondary hyperparathyroidism of renal origin: Secondary | ICD-10-CM | POA: Diagnosis not present

## 2024-05-28 DIAGNOSIS — N250 Renal osteodystrophy: Secondary | ICD-10-CM | POA: Diagnosis not present

## 2024-05-28 DIAGNOSIS — D509 Iron deficiency anemia, unspecified: Secondary | ICD-10-CM | POA: Diagnosis not present

## 2024-05-28 DIAGNOSIS — N186 End stage renal disease: Secondary | ICD-10-CM | POA: Diagnosis not present

## 2024-05-28 DIAGNOSIS — Z992 Dependence on renal dialysis: Secondary | ICD-10-CM | POA: Diagnosis not present

## 2024-05-28 DIAGNOSIS — D631 Anemia in chronic kidney disease: Secondary | ICD-10-CM | POA: Diagnosis not present

## 2024-05-31 DIAGNOSIS — N186 End stage renal disease: Secondary | ICD-10-CM | POA: Diagnosis not present

## 2024-05-31 DIAGNOSIS — D509 Iron deficiency anemia, unspecified: Secondary | ICD-10-CM | POA: Diagnosis not present

## 2024-05-31 DIAGNOSIS — Z992 Dependence on renal dialysis: Secondary | ICD-10-CM | POA: Diagnosis not present

## 2024-05-31 DIAGNOSIS — N2581 Secondary hyperparathyroidism of renal origin: Secondary | ICD-10-CM | POA: Diagnosis not present

## 2024-05-31 DIAGNOSIS — D631 Anemia in chronic kidney disease: Secondary | ICD-10-CM | POA: Diagnosis not present

## 2024-05-31 DIAGNOSIS — N250 Renal osteodystrophy: Secondary | ICD-10-CM | POA: Diagnosis not present

## 2024-06-01 ENCOUNTER — Encounter: Payer: Self-pay | Admitting: Adult Health

## 2024-06-01 ENCOUNTER — Non-Acute Institutional Stay (SKILLED_NURSING_FACILITY): Payer: Self-pay | Admitting: Adult Health

## 2024-06-01 DIAGNOSIS — E785 Hyperlipidemia, unspecified: Secondary | ICD-10-CM

## 2024-06-01 DIAGNOSIS — N2581 Secondary hyperparathyroidism of renal origin: Secondary | ICD-10-CM

## 2024-06-01 DIAGNOSIS — E1169 Type 2 diabetes mellitus with other specified complication: Secondary | ICD-10-CM | POA: Diagnosis not present

## 2024-06-01 DIAGNOSIS — E538 Deficiency of other specified B group vitamins: Secondary | ICD-10-CM | POA: Diagnosis not present

## 2024-06-01 NOTE — Progress Notes (Signed)
 Location:  [facility] Room Number: 112-D Place of Service:  SNF (31)   CODE STATUS: full   No Known Allergies  Chief Complaint  Patient presents with   Medical Management of Chronic Issues            Dyslipidemia associated with type 2 diabetes mellitus:  Vitamin B 12 deficiency:  Secondary hyperparathyroidism of renal origin    HPI:  He is a 78 y.o. long term resident of this facility being seen for the management of his chronic illnesses:  Dyslipidemia associated with type 2 diabetes mellitus:  Vitamin B 12 deficiency:  Secondary hyperparathyroidism of renal origin. There are no reports of uncontrolled pain. There are no reports of anxiety or depressive thoughts.    Past Medical History:  Diagnosis Date   Abnormal CT scan, kidney 10/06/2011   Acute pyelonephritis 10/07/2011   Anemia    normocytic   Anxiety    mental retardation   Bladder wall thickening 10/06/2011   BPH (benign prostatic hypertrophy)    Diabetes mellitus    Dialysis patient (HCC)    Tuesday, Thursday and Saturday,    DVT of leg (deep venous thrombosis) (HCC) 12/25/2016   Edema     history of lower extremity edema   GERD (gastroesophageal reflux disease)    Heme positive stool    Hydronephrosis    Hyperkalemia    Hyperlipidemia    Hypernatremia    Hypertension    Hypothyroidism    Impaired speech    Infected prosthetic vascular graft (HCC)    MR (mental retardation)    Muscle weakness    Obstructive uropathy    Perinephric abscess 10/07/2011   Poor historian poor historian   Primary colorectal adenocarcinoma (HCC) 04/2017   S/p total colectomy   Protein calorie malnutrition (HCC)    Pyelonephritis    Renal failure (ARF), acute on chronic (HCC)    Renal insufficiency    chronic history   Sepsis (HCC)    Smoking    Uremia    Urinary retention    UTI (lower urinary tract infection) 10/06/2011    Past Surgical History:  Procedure Laterality Date   A/V  FISTULAGRAM N/A 08/13/2018   Procedure: A/V FISTULAGRAM - Right Upper;  Surgeon: Harvey Carlin BRAVO, MD;  Location: [facility] INVASIVE CV LAB;  Service: Cardiovascular;  Laterality: N/A;   A/V FISTULAGRAM N/A 11/22/2018   Procedure: A/V FISTULAGRAM - Right Upper;  Surgeon: Sheree Penne Bruckner, MD;  Location: [facility] INVASIVE CV LAB;  Service: Cardiovascular;  Laterality: N/A;   A/V FISTULAGRAM Left 10/08/2022   Procedure: A/V Fistulagram;  Surgeon: Lanis Fonda BRAVO, MD;  Location: [facility] INVASIVE CV LAB;  Service: Cardiovascular;  Laterality: Left;   A/V FISTULAGRAM Left 05/06/2023   Procedure: A/V Fistulagram;  Surgeon: Magda Debby SAILOR, MD;  Location: [facility] INVASIVE CV LAB;  Service: Cardiovascular;  Laterality: Left;   AV FISTULA PLACEMENT Left 07/06/2015   Procedure:  INSERTION LEFT ARM ARTERIOVENOUS GORTEX GRAFT;  Surgeon: Bruckner GORMAN Blade, MD;  Location: [facility] OR;  Service: Vascular;  Laterality: Left;   AV FISTULA PLACEMENT Right 02/26/2016   Procedure: ARTERIOVENOUS (AV) FISTULA CREATION ;  Surgeon: Bruckner GORMAN Blade, MD;  Location: [facility] OR;  Service: Vascular;  Laterality: Right;   AV FISTULA PLACEMENT Right 11/25/2018   Procedure: INSERTION OF ARTERIOVENOUS (AV) ARTEGRAFT RIGHT UPPER ARM;  Surgeon: Sheree Penne Bruckner, MD;  Location: [facility] OR;  Service: Vascular;  Laterality: Right;   AV FISTULA PLACEMENT  Left 05/28/2021   Procedure: LEFT ARM ARTERIOVENOUS (AV) FISTULA;  Surgeon: Oris Krystal FALCON, MD;  Location: [facility] ORS;  Service: Vascular;  Laterality: Left;   AVGG REMOVAL Left 10/09/2015   Procedure: REMOVAL OF ARTERIOVENOUS GORETEX GRAFT (AVGG) Evacuation of Lymphocele, Vein Patch angioplasty of brachial artery.;  Surgeon: Lonni GORMAN Blade, MD;  Location: [facility] OR;  Service: Vascular;  Laterality: Left;   BASCILIC VEIN TRANSPOSITION Right 02/26/2016   Procedure: Right BASCILIC VEIN TRANSPOSITION;  Surgeon: Lonni GORMAN Blade, MD;  Location: [facility] OR;  Service: Vascular;  Laterality: Right;    BASCILIC VEIN TRANSPOSITION Left 07/30/2021   Procedure: LEFT ARM SECOND STAGE BASILIC VEIN TRANSPOSITION;  Surgeon: Oris Krystal FALCON, MD;  Location: [facility] ORS;  Service: Vascular;  Laterality: Left;   CIRCUMCISION N/A 01/04/2014   Procedure: CIRCUMCISION ADULT (procedure #1);  Surgeon: Mohammad I Javaid, MD;  Location: [facility] ORS;  Service: Urology;  Laterality: N/A;   COLECTOMY N/A 05/04/2017   Procedure: TOTAL COLECTOMY;  Surgeon: Mavis Anes, MD;  Location: [facility] ORS;  Service: General;  Laterality: N/A;   COLONOSCOPY N/A 04/27/2017   Surgeon: Shaaron Lamar HERO, MD; annular mass in the ascending colon likely representing cancer biopsied, multiple 6-22 mm polyps removed, clean rectum.  Pathology with multiple tubular adenomas, high-grade dysplasia noted in ascending colon and splenic flexure.   CYSTOSCOPY W/ RETROGRADES Bilateral 06/29/2015   Procedure: CYSTOSCOPY, DILATION OF URETHRAL STRICTURE WITH BILATERAL RETROGRADE PYELOGRAM,SUPRAPUBIC TUBE CHANGE;  Surgeon: Donnice Brooks, MD;  Location: [facility] ORS;  Service: Urology;  Laterality: Bilateral;   CYSTOSCOPY WITH URETHRAL DILATATION N/A 12/29/2013   Procedure: CYSTOSCOPY WITH URETHRAL DILATATION;  Surgeon: Emery LILLETTE Blaze, MD;  Location: [facility] ORS;  Service: Urology;  Laterality: N/A;   ESOPHAGOGASTRODUODENOSCOPY N/A 04/27/2017   Procedure: ESOPHAGOGASTRODUODENOSCOPY (EGD);  Surgeon: Shaaron Lamar HERO, MD;  Location: [facility];  Service: Endoscopy;  Laterality: N/A;   FLEXIBLE SIGMOIDOSCOPY N/A 12/02/2021   Procedure: FLEXIBLE SIGMOIDOSCOPY;  Surgeon: Cindie Carlin POUR, DO;  Location: [facility];  Service: Endoscopy;  Laterality: N/A;  10:30am, dialysis patient   INSERTION OF DIALYSIS CATHETER Right 11/25/2018   Procedure: INSERTION OF DIALYSIS CATHETER RIGHT INTERNAL JUGULAR;  Surgeon: Sheree Penne Lonni, MD;  Location: [facility] OR;  Service: Vascular;  Laterality: Right;   IR AV DIALY SHUNT INTRO NEEDLE/INTRAC INITIAL W/PTA/STENT/IMG LT Left 02/16/2024    IR AV DIALY SHUNT INTRO NEEDLE/INTRACATH INITIAL W/PTA/IMG RIGHT Right 09/07/2018   IR FLUORO GUIDE CV LINE RIGHT  10/16/2020   IR FLUORO GUIDE CV LINE RIGHT  10/03/2022   IR REMOVAL TUN CV CATH W/O FL  01/12/2019   IR THROMBECTOMY AV FISTULA W/THROMBOLYSIS/PTA INC/SHUNT/IMG RIGHT Right 04/26/2018   IR US  GUIDE VASC ACCESS LEFT  02/17/2024   IR US  GUIDE VASC ACCESS RIGHT  04/26/2018   IR US  GUIDE VASC ACCESS RIGHT  09/07/2018   IR US  GUIDE VASC ACCESS RIGHT  10/16/2020   IR US  GUIDE VASC ACCESS RIGHT  10/16/2020   IR US  GUIDE VASC ACCESS RIGHT  10/03/2022   ORIF FEMUR FRACTURE Right 11/22/2016   Procedure: OPEN REDUCTION INTERNAL FIXATION (ORIF) DISTAL FEMUR FRACTURE;  Surgeon: Redell Shoals, MD;  Location: [facility] OR;  Service: Orthopedics;  Laterality: Right;   PATCH ANGIOPLASTY Right 12/10/2017   Procedure: PATCH ANGIOPLASTY;  Surgeon: Blade Lonni GORMAN, MD;  Location: [facility] OR;  Service: Vascular;  Laterality: Right;   PERIPHERAL VASCULAR BALLOON ANGIOPLASTY  08/13/2018   Procedure: PERIPHERAL VASCULAR BALLOON ANGIOPLASTY;  Surgeon: Harvey Carlin BRAVO, MD;  Location: [facility]  INVASIVE CV LAB;  Service: Cardiovascular;;  right AV fistula    PERIPHERAL VASCULAR BALLOON ANGIOPLASTY  11/22/2018   Procedure: PERIPHERAL VASCULAR BALLOON ANGIOPLASTY;  Surgeon: Sheree Penne Bruckner, MD;  Location: [facility] INVASIVE CV LAB;  Service: Cardiovascular;;  rt AV fistula   PERIPHERAL VASCULAR BALLOON ANGIOPLASTY  10/08/2022   Procedure: PERIPHERAL VASCULAR BALLOON ANGIOPLASTY;  Surgeon: Lanis Fonda BRAVO, MD;  Location: [facility] INVASIVE CV LAB;  Service: Cardiovascular;;  arterial anastamosis   PERIPHERAL VASCULAR CATHETERIZATION N/A 10/08/2015   Procedure: A/V Shuntogram;  Surgeon: Bruckner GORMAN Blade, MD;  Location: [facility] INVASIVE CV LAB;  Service: Cardiovascular;  Laterality: N/A;   PERIPHERAL VASCULAR INTERVENTION Left 10/08/2022   Procedure: PERIPHERAL VASCULAR INTERVENTION;  Surgeon: Lanis Fonda BRAVO, MD;   Location: [facility] INVASIVE CV LAB;  Service: Cardiovascular;  Laterality: Left;  left AVF   REMOVAL OF A DIALYSIS CATHETER N/A 12/03/2021   Procedure: MINOR REMOVAL OF A TUNNELED DIALYSIS CATHETER;  Surgeon: Oris Krystal FALCON, MD;  Location: [facility] ORS;  Service: Vascular;  Laterality: N/A;   REMOVAL OF A DIALYSIS CATHETER N/A 11/18/2022   Procedure: MINOR REMOVAL OF A TUNNELED DIALYSIS CATHETER;  Surgeon: Oris Krystal FALCON, MD;  Location: [facility] ORS;  Service: Vascular;  Laterality: N/A;   THROMBECTOMY W/ EMBOLECTOMY Right 12/10/2017   Procedure: THROMBECTOMY REVISION RIGHT ARM  ARTERIOVENOUS FISTULA;  Surgeon: Blade Bruckner GORMAN, MD;  Location: [facility] OR;  Service: Vascular;  Laterality: Right;   TRANSURETHRAL RESECTION OF PROSTATE N/A 01/04/2014   Procedure: TRANSURETHRAL RESECTION OF THE PROSTATE (TURP) (procedure #2);  Surgeon: Mohammad I Javaid, MD;  Location: [facility] ORS;  Service: Urology;  Laterality: N/A;    Social History   Socioeconomic History   Marital status: Single    Spouse name: Not on file   Number of children: Not on file   Years of education: Not on file   Highest education level: Not on file  Occupational History   Occupation: retired   Tobacco Use   Smoking status: Never   Smokeless tobacco: Never  Vaping Use   Vaping status: Never Used  Substance and Sexual Activity   Alcohol use: No   Drug use: No   Sexual activity: Not Currently  Other Topics Concern   Not on file  Social History Narrative   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   Long term resident of [facility]    Social Drivers of Health   Financial Resource Strain: Low Risk  (02/18/2018)   Overall Financial Resource Strain (CARDIA)    Difficulty of Paying Living Expenses: Not hard at all  Food Insecurity: No Food Insecurity (02/17/2024)   Hunger Vital Sign    Worried About Running Out of Food in the Last Year: Never true    Ran Out of Food in the Last Year: Never true  Transportation Needs:  No Transportation Needs (02/17/2024)   PRAPARE - Administrator, Civil Service (Medical): No    Lack of Transportation (Non-Medical): No  Physical Activity: Unknown (09/15/2019)   Exercise Vital Sign    Days of Exercise per Week: Not on file    Minutes of Exercise per Session: 0 min  Stress: No Stress Concern Present (02/18/2018)   [employer] of Occupational Health - Occupational Stress Questionnaire    Feeling of Stress : Not at all  Social Connections: Socially Isolated (02/17/2024)   Social Connection and Isolation Panel    Frequency of Communication with Friends and Family: Three times a week  Frequency of Social Gatherings with Friends and Family: Once a week    Attends Religious Services: Never    Database administrator or Organizations: No    Attends Banker Meetings: Never    Marital Status: Never married  Intimate Partner Violence: Not At Risk (02/17/2024)   Humiliation, Afraid, Rape, and Kick questionnaire    Fear of Current or Ex-Partner: No    Emotionally Abused: No    Physically Abused: No    Sexually Abused: No   Family History  Problem Relation Age of Onset   Cancer Mother    Colon cancer Neg Hx       VITAL SIGNS BP 107/76   Pulse 80   Temp 97.8 F (36.6 C)   Ht 5' 9 (1.753 m)   Wt 204 lb 12.8 oz (92.9 kg)   SpO2 95%   BMI 30.24 kg/m   Outpatient Encounter Medications as of 06/01/2024  Medication Sig Note   aspirin  81 MG chewable tablet Chew 81 mg by mouth daily.    Continuous Glucose Sensor (FREESTYLE LIBRE 2 PLUS SENSOR) MISC by Does not apply route.    insulin  glargine-yfgn (SEMGLEE , YFGN,) 100 UNIT/ML Pen Inject 8 Units into the skin at bedtime.    levothyroxine  (SYNTHROID ) 150 MCG tablet Take 150 mcg by mouth daily before breakfast.    midodrine  (PROAMATINE ) 10 MG tablet Take 10 mg by mouth See admin instructions. Send medication with resident on Tues/Thurs/Sat to dialysis. Do not administer at [facility], give to  resident to take with him to dialysis. 02/17/2024: Confirmed per [facility] - pt only receives while at dialysis on Tues/Thurs/Sat. Pt receives 2 strengths of midodrine  to equal 15mg  per dose.   midodrine  (PROAMATINE ) 5 MG tablet Take 5 mg by mouth See admin instructions. Send medication with resident on Tues/Thurs/Sat to dialysis. Do not administer at [facility], give to resident to take with him to dialysis.    Omega-3 Fatty Acids (OMEGA-3 FISH OIL PO) Take 2 capsules by mouth at bedtime. Omega 3 - DHA - EPA - Fish oil 300-1000mg  capsules    rosuvastatin  (CRESTOR ) 20 MG tablet Take 20 mg by mouth at bedtime.    sevelamer  carbonate (RENVELA ) 800 MG tablet Take 3 tablets (2,400 mg total) by mouth 3 (three) times daily with meals.    tamsulosin  (FLOMAX ) 0.4 MG CAPS capsule Take 0.4 mg by mouth at bedtime. Give 30 minutes after a meal. Do not crush or chew    torsemide  (DEMADEX ) 20 MG tablet Take 20 mg by mouth See admin instructions. Give 1 tablet (20mg ) by mouth once a day in the afternoon on Sunday, Monday, Wednesday, Friday.    UNABLE TO FIND Med Name: BD AutoShield Duo pen needle (30g x 3/16 needle)    vitamin B-12 (CYANOCOBALAMIN ) 1000 MCG tablet Take 1,000 mcg by mouth at bedtime. (Patient not taking: Reported on 06/01/2024)    No facility-administered encounter medications on file as of 06/01/2024.     SIGNIFICANT DIAGNOSTIC EXAMS  LABS REVIEWED PREVIOUS;       06-11-23: wbc 4.4; hgb 11.7; hct 35.2; mcv 102.3 plt 88; glucose 160; bun 20; creat 4.71; k+ 3.5; na++ 137; ca 8.4; gfr 12   06-13-23: wbc 3.6; hgb 13.0; hct 39.2; mcv 102.6 plt 110; glucose 103; bun 11; creat 4.13; k+ 3.3; na++ 138; ca 8.6; gfr 14; d-dimer 0.54; CRP <0.5  06-16-23: glucose 99; bun 40; creat 10.88; k+ 4.6; na++ 136; ca 8.8; gfr 5 07-16-23: hgb  A1c 5.7; chol 134; ldl 39; trig 486; hdl 24 09-03-23: tsh 3.852 free t4: 0.82 09-14-23: protein 6.9 albumin  3.2; chol 127; ldl 37 trig 420 hdl 25 12-17-23: wbc 3.9; hgb 10.6; hct 30.5; mcv  103.7 plt 110; hgb A1c 6.6; PSA 8.02; chol 118; ldl 45; trig 222; hdl 29; tsh 10.663 02-16-24: wbc 4.3; hgb 9.3; hct 27.9; mcv 104.5 plt 81; glucose 138; bun 80; creat 17.85; k+ 5.8; na++ 134; ca 9.3 gfr 2; protein 7.3 albumin  3.4 02-18-24: wbc 3.9; hgb 8.7; hct 25.5; mcv 102.4 plt 78; glucose 133; bun 52; creat 13.86; k+ 4.7; na++ 137; ca 8.7; gfr 3; protein 6.6 albumin  3.1 mag 2.5 phos 6.9  02-23-24: wbc 4.8; hgb 8.8; hct 26.8; mcv 105.9 plt 140; tsh 2.608; vitamin B 12: 4099 02-26-24:vitamin B12: 2246 03-24-24: hgb A1c 6.0 04-15-24: PSA 4.91   NO NEW LABS.    Review of Systems  Constitutional:  Negative for malaise/fatigue.  Respiratory:  Negative for cough and shortness of breath.   Cardiovascular:  Negative for chest pain, palpitations and leg swelling.  Gastrointestinal:  Negative for abdominal pain, constipation and heartburn.  Musculoskeletal:  Negative for back pain, joint pain and myalgias.  Skin: Negative.   Neurological:  Negative for dizziness.  Psychiatric/Behavioral:  The patient is not nervous/anxious.    Physical Exam Constitutional:      General: He is not in acute distress.    Appearance: He is well-developed. He is obese. He is not diaphoretic.  Neck:     Thyroid : No thyromegaly.  Cardiovascular:     Rate and Rhythm: Normal rate and regular rhythm.     Heart sounds: Normal heart sounds.  Pulmonary:     Effort: Pulmonary effort is normal. No respiratory distress.     Breath sounds: Normal breath sounds.  Abdominal:     General: Bowel sounds are normal. There is no distension.     Palpations: Abdomen is soft.     Tenderness: There is no abdominal tenderness.  Musculoskeletal:        General: Normal range of motion.     Cervical back: Neck supple.     Right lower leg: No edema.     Left lower leg: No edema.  Lymphadenopathy:     Cervical: No cervical adenopathy.  Skin:    General: Skin is warm and dry.     Comments: Left arm fistula +thrill/bruit     Neurological:     Mental Status: He is alert. Mental status is at baseline.     Comments: SLUMS 2/30  Psychiatric:        Mood and Affect: Mood normal.         ASSESSMENT/ PLAN:  TODAY  Dyslipidemia associated with type 2 diabetes mellitus: ldl 37; trig 420; will continue crestor  20 mg daily   2. Vitamin B 12 deficiency: is on supplements  3. Secondary hyperparathyroidism of renal origin: will monitor   PREVIOUS   4. Bilateral lower extremity edema: will continue demedex 20 mg four days per week.   5. Type 2 diabetes mellitus with hypertension and end stage renal disease on hemodialysis: hgb A1c 6.0 will continue basaglar  8 units nightly is on asa and statin   6. Thrombocytopenia: plt 81  7. Aortic atherosclerosis (ct 12-30-21) is on asa and statin  8. 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: on 3 days weekly; no longer on fluid restriction is unable to adhere; is on renvela   2400 mg three times daily   9. BPH: will continue flomax  0.4 mg daily   10. Neurocognitive deficits: SLUMS 2/30   11. Other specified hypothyroidism: tsh 2.608 is on synthroid  150 mcg daily will monitor   12. Anemia of chronic renal disease on chronic dialysis hgb 8.8; hct 26.8  13. Major depression single episode moderate: is presently off medications.   14. Elevated PSA: is lower at 4.91   15. Osteopenia: t score -2.052 is on supplements  16. GERD without esophagitis: is off PPI  17. History of colon cancer: s/t colectomy last seen by GI on 12-19-21 ct scan done 12-30-21      Barnie Seip NP [facility]  call [phone]

## 2024-06-02 DIAGNOSIS — N2581 Secondary hyperparathyroidism of renal origin: Secondary | ICD-10-CM | POA: Diagnosis not present

## 2024-06-02 DIAGNOSIS — D631 Anemia in chronic kidney disease: Secondary | ICD-10-CM | POA: Diagnosis not present

## 2024-06-02 DIAGNOSIS — N186 End stage renal disease: Secondary | ICD-10-CM | POA: Diagnosis not present

## 2024-06-02 DIAGNOSIS — D509 Iron deficiency anemia, unspecified: Secondary | ICD-10-CM | POA: Diagnosis not present

## 2024-06-02 DIAGNOSIS — Z992 Dependence on renal dialysis: Secondary | ICD-10-CM | POA: Diagnosis not present

## 2024-06-02 DIAGNOSIS — N250 Renal osteodystrophy: Secondary | ICD-10-CM | POA: Diagnosis not present

## 2024-06-04 DIAGNOSIS — N186 End stage renal disease: Secondary | ICD-10-CM | POA: Diagnosis not present

## 2024-06-04 DIAGNOSIS — Z992 Dependence on renal dialysis: Secondary | ICD-10-CM | POA: Diagnosis not present

## 2024-06-04 DIAGNOSIS — N2581 Secondary hyperparathyroidism of renal origin: Secondary | ICD-10-CM | POA: Diagnosis not present

## 2024-06-04 DIAGNOSIS — D509 Iron deficiency anemia, unspecified: Secondary | ICD-10-CM | POA: Diagnosis not present

## 2024-06-04 DIAGNOSIS — N250 Renal osteodystrophy: Secondary | ICD-10-CM | POA: Diagnosis not present

## 2024-06-04 DIAGNOSIS — D631 Anemia in chronic kidney disease: Secondary | ICD-10-CM | POA: Diagnosis not present

## 2024-06-07 DIAGNOSIS — N186 End stage renal disease: Secondary | ICD-10-CM | POA: Diagnosis not present

## 2024-06-07 DIAGNOSIS — N250 Renal osteodystrophy: Secondary | ICD-10-CM | POA: Diagnosis not present

## 2024-06-07 DIAGNOSIS — D631 Anemia in chronic kidney disease: Secondary | ICD-10-CM | POA: Diagnosis not present

## 2024-06-07 DIAGNOSIS — Z992 Dependence on renal dialysis: Secondary | ICD-10-CM | POA: Diagnosis not present

## 2024-06-07 DIAGNOSIS — N2581 Secondary hyperparathyroidism of renal origin: Secondary | ICD-10-CM | POA: Diagnosis not present

## 2024-06-07 DIAGNOSIS — D509 Iron deficiency anemia, unspecified: Secondary | ICD-10-CM | POA: Diagnosis not present

## 2024-06-08 DIAGNOSIS — F4322 Adjustment disorder with anxiety: Secondary | ICD-10-CM | POA: Diagnosis not present

## 2024-06-08 DIAGNOSIS — F70 Mild intellectual disabilities: Secondary | ICD-10-CM | POA: Diagnosis not present

## 2024-06-09 DIAGNOSIS — D509 Iron deficiency anemia, unspecified: Secondary | ICD-10-CM | POA: Diagnosis not present

## 2024-06-09 DIAGNOSIS — D631 Anemia in chronic kidney disease: Secondary | ICD-10-CM | POA: Diagnosis not present

## 2024-06-09 DIAGNOSIS — N250 Renal osteodystrophy: Secondary | ICD-10-CM | POA: Diagnosis not present

## 2024-06-09 DIAGNOSIS — N2581 Secondary hyperparathyroidism of renal origin: Secondary | ICD-10-CM | POA: Diagnosis not present

## 2024-06-09 DIAGNOSIS — Z992 Dependence on renal dialysis: Secondary | ICD-10-CM | POA: Diagnosis not present

## 2024-06-09 DIAGNOSIS — N186 End stage renal disease: Secondary | ICD-10-CM | POA: Diagnosis not present

## 2024-06-11 DIAGNOSIS — Z992 Dependence on renal dialysis: Secondary | ICD-10-CM | POA: Diagnosis not present

## 2024-06-11 DIAGNOSIS — N250 Renal osteodystrophy: Secondary | ICD-10-CM | POA: Diagnosis not present

## 2024-06-11 DIAGNOSIS — D509 Iron deficiency anemia, unspecified: Secondary | ICD-10-CM | POA: Diagnosis not present

## 2024-06-11 DIAGNOSIS — N2581 Secondary hyperparathyroidism of renal origin: Secondary | ICD-10-CM | POA: Diagnosis not present

## 2024-06-11 DIAGNOSIS — D631 Anemia in chronic kidney disease: Secondary | ICD-10-CM | POA: Diagnosis not present

## 2024-06-11 DIAGNOSIS — N186 End stage renal disease: Secondary | ICD-10-CM | POA: Diagnosis not present

## 2024-06-13 DIAGNOSIS — N186 End stage renal disease: Secondary | ICD-10-CM | POA: Diagnosis not present

## 2024-06-13 DIAGNOSIS — D631 Anemia in chronic kidney disease: Secondary | ICD-10-CM | POA: Diagnosis not present

## 2024-06-13 DIAGNOSIS — N2581 Secondary hyperparathyroidism of renal origin: Secondary | ICD-10-CM | POA: Diagnosis not present

## 2024-06-13 DIAGNOSIS — N250 Renal osteodystrophy: Secondary | ICD-10-CM | POA: Diagnosis not present

## 2024-06-13 DIAGNOSIS — Z992 Dependence on renal dialysis: Secondary | ICD-10-CM | POA: Diagnosis not present

## 2024-06-13 DIAGNOSIS — D509 Iron deficiency anemia, unspecified: Secondary | ICD-10-CM | POA: Diagnosis not present

## 2024-06-14 DIAGNOSIS — N2581 Secondary hyperparathyroidism of renal origin: Secondary | ICD-10-CM | POA: Diagnosis not present

## 2024-06-14 DIAGNOSIS — N250 Renal osteodystrophy: Secondary | ICD-10-CM | POA: Diagnosis not present

## 2024-06-14 DIAGNOSIS — D509 Iron deficiency anemia, unspecified: Secondary | ICD-10-CM | POA: Diagnosis not present

## 2024-06-14 DIAGNOSIS — Z992 Dependence on renal dialysis: Secondary | ICD-10-CM | POA: Diagnosis not present

## 2024-06-14 DIAGNOSIS — N186 End stage renal disease: Secondary | ICD-10-CM | POA: Diagnosis not present

## 2024-06-14 DIAGNOSIS — D631 Anemia in chronic kidney disease: Secondary | ICD-10-CM | POA: Diagnosis not present

## 2024-06-16 DIAGNOSIS — Z992 Dependence on renal dialysis: Secondary | ICD-10-CM | POA: Diagnosis not present

## 2024-06-16 DIAGNOSIS — N2581 Secondary hyperparathyroidism of renal origin: Secondary | ICD-10-CM | POA: Diagnosis not present

## 2024-06-16 DIAGNOSIS — N250 Renal osteodystrophy: Secondary | ICD-10-CM | POA: Diagnosis not present

## 2024-06-16 DIAGNOSIS — D509 Iron deficiency anemia, unspecified: Secondary | ICD-10-CM | POA: Diagnosis not present

## 2024-06-16 DIAGNOSIS — N186 End stage renal disease: Secondary | ICD-10-CM | POA: Diagnosis not present

## 2024-06-16 DIAGNOSIS — D631 Anemia in chronic kidney disease: Secondary | ICD-10-CM | POA: Diagnosis not present

## 2024-06-18 DIAGNOSIS — Z992 Dependence on renal dialysis: Secondary | ICD-10-CM | POA: Diagnosis not present

## 2024-06-18 DIAGNOSIS — D509 Iron deficiency anemia, unspecified: Secondary | ICD-10-CM | POA: Diagnosis not present

## 2024-06-18 DIAGNOSIS — N186 End stage renal disease: Secondary | ICD-10-CM | POA: Diagnosis not present

## 2024-06-18 DIAGNOSIS — D631 Anemia in chronic kidney disease: Secondary | ICD-10-CM | POA: Diagnosis not present

## 2024-06-18 DIAGNOSIS — N250 Renal osteodystrophy: Secondary | ICD-10-CM | POA: Diagnosis not present

## 2024-06-18 DIAGNOSIS — N2581 Secondary hyperparathyroidism of renal origin: Secondary | ICD-10-CM | POA: Diagnosis not present

## 2024-06-21 DIAGNOSIS — Z992 Dependence on renal dialysis: Secondary | ICD-10-CM | POA: Diagnosis not present

## 2024-06-21 DIAGNOSIS — D509 Iron deficiency anemia, unspecified: Secondary | ICD-10-CM | POA: Diagnosis not present

## 2024-06-21 DIAGNOSIS — N186 End stage renal disease: Secondary | ICD-10-CM | POA: Diagnosis not present

## 2024-06-21 DIAGNOSIS — D631 Anemia in chronic kidney disease: Secondary | ICD-10-CM | POA: Diagnosis not present

## 2024-06-21 DIAGNOSIS — N2581 Secondary hyperparathyroidism of renal origin: Secondary | ICD-10-CM | POA: Diagnosis not present

## 2024-06-21 DIAGNOSIS — N250 Renal osteodystrophy: Secondary | ICD-10-CM | POA: Diagnosis not present

## 2024-06-23 DIAGNOSIS — D509 Iron deficiency anemia, unspecified: Secondary | ICD-10-CM | POA: Diagnosis not present

## 2024-06-23 DIAGNOSIS — N250 Renal osteodystrophy: Secondary | ICD-10-CM | POA: Diagnosis not present

## 2024-06-23 DIAGNOSIS — Z992 Dependence on renal dialysis: Secondary | ICD-10-CM | POA: Diagnosis not present

## 2024-06-23 DIAGNOSIS — N186 End stage renal disease: Secondary | ICD-10-CM | POA: Diagnosis not present

## 2024-06-23 DIAGNOSIS — D631 Anemia in chronic kidney disease: Secondary | ICD-10-CM | POA: Diagnosis not present

## 2024-06-23 DIAGNOSIS — N2581 Secondary hyperparathyroidism of renal origin: Secondary | ICD-10-CM | POA: Diagnosis not present

## 2024-06-25 DIAGNOSIS — D509 Iron deficiency anemia, unspecified: Secondary | ICD-10-CM | POA: Diagnosis not present

## 2024-06-25 DIAGNOSIS — D631 Anemia in chronic kidney disease: Secondary | ICD-10-CM | POA: Diagnosis not present

## 2024-06-25 DIAGNOSIS — N250 Renal osteodystrophy: Secondary | ICD-10-CM | POA: Diagnosis not present

## 2024-06-25 DIAGNOSIS — N2581 Secondary hyperparathyroidism of renal origin: Secondary | ICD-10-CM | POA: Diagnosis not present

## 2024-06-25 DIAGNOSIS — N186 End stage renal disease: Secondary | ICD-10-CM | POA: Diagnosis not present

## 2024-06-25 DIAGNOSIS — Z992 Dependence on renal dialysis: Secondary | ICD-10-CM | POA: Diagnosis not present

## 2024-06-26 DIAGNOSIS — N186 End stage renal disease: Secondary | ICD-10-CM | POA: Diagnosis not present

## 2024-06-26 DIAGNOSIS — Z992 Dependence on renal dialysis: Secondary | ICD-10-CM | POA: Diagnosis not present

## 2024-06-27 DIAGNOSIS — D631 Anemia in chronic kidney disease: Secondary | ICD-10-CM | POA: Diagnosis not present

## 2024-06-27 DIAGNOSIS — N186 End stage renal disease: Secondary | ICD-10-CM | POA: Diagnosis not present

## 2024-06-27 DIAGNOSIS — N2581 Secondary hyperparathyroidism of renal origin: Secondary | ICD-10-CM | POA: Diagnosis not present

## 2024-06-27 DIAGNOSIS — Z992 Dependence on renal dialysis: Secondary | ICD-10-CM | POA: Diagnosis not present

## 2024-06-27 DIAGNOSIS — N250 Renal osteodystrophy: Secondary | ICD-10-CM | POA: Diagnosis not present

## 2024-06-27 DIAGNOSIS — D509 Iron deficiency anemia, unspecified: Secondary | ICD-10-CM | POA: Diagnosis not present

## 2024-06-28 DIAGNOSIS — N2581 Secondary hyperparathyroidism of renal origin: Secondary | ICD-10-CM | POA: Diagnosis not present

## 2024-06-28 DIAGNOSIS — N186 End stage renal disease: Secondary | ICD-10-CM | POA: Diagnosis not present

## 2024-06-28 DIAGNOSIS — Z992 Dependence on renal dialysis: Secondary | ICD-10-CM | POA: Diagnosis not present

## 2024-06-28 DIAGNOSIS — D509 Iron deficiency anemia, unspecified: Secondary | ICD-10-CM | POA: Diagnosis not present

## 2024-06-28 DIAGNOSIS — D631 Anemia in chronic kidney disease: Secondary | ICD-10-CM | POA: Diagnosis not present

## 2024-06-28 DIAGNOSIS — N250 Renal osteodystrophy: Secondary | ICD-10-CM | POA: Diagnosis not present

## 2024-06-30 DIAGNOSIS — N250 Renal osteodystrophy: Secondary | ICD-10-CM | POA: Diagnosis not present

## 2024-06-30 DIAGNOSIS — Z992 Dependence on renal dialysis: Secondary | ICD-10-CM | POA: Diagnosis not present

## 2024-06-30 DIAGNOSIS — N2581 Secondary hyperparathyroidism of renal origin: Secondary | ICD-10-CM | POA: Diagnosis not present

## 2024-06-30 DIAGNOSIS — D509 Iron deficiency anemia, unspecified: Secondary | ICD-10-CM | POA: Diagnosis not present

## 2024-06-30 DIAGNOSIS — D631 Anemia in chronic kidney disease: Secondary | ICD-10-CM | POA: Diagnosis not present

## 2024-06-30 DIAGNOSIS — N186 End stage renal disease: Secondary | ICD-10-CM | POA: Diagnosis not present

## 2024-07-02 DIAGNOSIS — D631 Anemia in chronic kidney disease: Secondary | ICD-10-CM | POA: Diagnosis not present

## 2024-07-02 DIAGNOSIS — D509 Iron deficiency anemia, unspecified: Secondary | ICD-10-CM | POA: Diagnosis not present

## 2024-07-02 DIAGNOSIS — Z992 Dependence on renal dialysis: Secondary | ICD-10-CM | POA: Diagnosis not present

## 2024-07-02 DIAGNOSIS — N186 End stage renal disease: Secondary | ICD-10-CM | POA: Diagnosis not present

## 2024-07-02 DIAGNOSIS — N250 Renal osteodystrophy: Secondary | ICD-10-CM | POA: Diagnosis not present

## 2024-07-02 DIAGNOSIS — N2581 Secondary hyperparathyroidism of renal origin: Secondary | ICD-10-CM | POA: Diagnosis not present

## 2024-07-05 ENCOUNTER — Non-Acute Institutional Stay (SKILLED_NURSING_FACILITY): Payer: Self-pay | Admitting: Adult Health

## 2024-07-05 ENCOUNTER — Encounter: Payer: Self-pay | Admitting: Adult Health

## 2024-07-05 DIAGNOSIS — D696 Thrombocytopenia, unspecified: Secondary | ICD-10-CM

## 2024-07-05 DIAGNOSIS — D509 Iron deficiency anemia, unspecified: Secondary | ICD-10-CM | POA: Diagnosis not present

## 2024-07-05 DIAGNOSIS — Z992 Dependence on renal dialysis: Secondary | ICD-10-CM

## 2024-07-05 DIAGNOSIS — I120 Hypertensive chronic kidney disease with stage 5 chronic kidney disease or end stage renal disease: Secondary | ICD-10-CM | POA: Diagnosis not present

## 2024-07-05 DIAGNOSIS — N250 Renal osteodystrophy: Secondary | ICD-10-CM | POA: Diagnosis not present

## 2024-07-05 DIAGNOSIS — N186 End stage renal disease: Secondary | ICD-10-CM | POA: Diagnosis not present

## 2024-07-05 DIAGNOSIS — E1122 Type 2 diabetes mellitus with diabetic chronic kidney disease: Secondary | ICD-10-CM | POA: Diagnosis not present

## 2024-07-05 DIAGNOSIS — D631 Anemia in chronic kidney disease: Secondary | ICD-10-CM | POA: Diagnosis not present

## 2024-07-05 DIAGNOSIS — N2581 Secondary hyperparathyroidism of renal origin: Secondary | ICD-10-CM | POA: Diagnosis not present

## 2024-07-05 DIAGNOSIS — R600 Localized edema: Secondary | ICD-10-CM | POA: Diagnosis not present

## 2024-07-05 NOTE — Progress Notes (Signed)
 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 There are no reports of uncontrolled pain. There are no reports of depressive thoughts. He continues to tolerate dialysis.     Past Medical History:  Diagnosis Date   Abnormal CT scan, kidney 10/06/2011   Acute pyelonephritis 10/07/2011   Anemia    normocytic   Anxiety    mental retardation   Bladder wall thickening 10/06/2011   BPH (benign prostatic hypertrophy)    Diabetes mellitus    Dialysis patient (HCC)    Tuesday, Thursday and Saturday,    DVT of leg (deep venous thrombosis) (HCC) 12/25/2016   Edema     history of lower extremity edema   GERD (gastroesophageal reflux disease)    Heme positive stool    Hydronephrosis    Hyperkalemia    Hyperlipidemia    Hypernatremia    Hypertension    Hypothyroidism    Impaired speech    Infected prosthetic vascular graft (HCC)    MR (mental retardation)    Muscle weakness    Obstructive uropathy    Perinephric abscess 10/07/2011   Poor historian poor historian   Primary colorectal adenocarcinoma (HCC) 04/2017   S/p total colectomy   Protein calorie malnutrition (HCC)    Pyelonephritis    Renal failure (ARF), acute on chronic (HCC)    Renal insufficiency    chronic history   Sepsis (HCC)    Smoking    Uremia    Urinary retention    UTI (lower urinary tract infection) 10/06/2011    Past Surgical  History:  Procedure Laterality Date   A/V FISTULAGRAM N/A 08/13/2018   Procedure: A/V FISTULAGRAM - Right Upper;  Surgeon: Harvey Carlin BRAVO, MD;  Location: [facility] INVASIVE CV LAB;  Service: Cardiovascular;  Laterality: N/A;   A/V FISTULAGRAM N/A 11/22/2018   Procedure: A/V FISTULAGRAM - Right Upper;  Surgeon: Sheree Penne Bruckner, MD;  Location: [facility] INVASIVE CV LAB;  Service: Cardiovascular;  Laterality: N/A;   A/V FISTULAGRAM Left 10/08/2022   Procedure: A/V Fistulagram;  Surgeon: Lanis Fonda BRAVO, MD;  Location: [facility] INVASIVE CV LAB;  Service: Cardiovascular;  Laterality: Left;   A/V FISTULAGRAM Left 05/06/2023   Procedure: A/V Fistulagram;  Surgeon: Magda Debby SAILOR, MD;  Location: [facility] INVASIVE CV LAB;  Service: Cardiovascular;  Laterality: Left;   AV FISTULA PLACEMENT Left 07/06/2015   Procedure:  INSERTION LEFT ARM ARTERIOVENOUS GORTEX GRAFT;  Surgeon: Bruckner GORMAN Blade, MD;  Location: [facility] OR;  Service: Vascular;  Laterality: Left;   AV FISTULA PLACEMENT Right 02/26/2016   Procedure: ARTERIOVENOUS (AV) FISTULA CREATION ;  Surgeon: Bruckner GORMAN Blade, MD;  Location: [facility] OR;  Service: Vascular;  Laterality: Right;   AV FISTULA PLACEMENT Right 11/25/2018   Procedure: INSERTION OF ARTERIOVENOUS (AV) ARTEGRAFT RIGHT UPPER ARM;  Surgeon: Sheree Penne Bruckner, MD;  Location: [facility] OR;  Service: Vascular;  Laterality: Right;   AV FISTULA PLACEMENT Left 05/28/2021   Procedure: LEFT ARM ARTERIOVENOUS (AV) FISTULA;  Surgeon: Oris Krystal FALCON, MD;  Location: [facility] ORS;  Service: Vascular;  Laterality: Left;   AVGG REMOVAL Left 10/09/2015   Procedure: REMOVAL OF ARTERIOVENOUS GORETEX GRAFT (AVGG) Evacuation of Lymphocele, Vein Patch angioplasty of brachial artery.;  Surgeon: Lonni GORMAN Blade, MD;  Location: [facility] OR;  Service: Vascular;  Laterality: Left;   BASCILIC VEIN TRANSPOSITION Right 02/26/2016   Procedure: Right BASCILIC VEIN TRANSPOSITION;  Surgeon: Lonni GORMAN Blade, MD;  Location: [facility] OR;   Service: Vascular;  Laterality: Right;   BASCILIC VEIN TRANSPOSITION Left 07/30/2021   Procedure: LEFT ARM SECOND STAGE BASILIC VEIN TRANSPOSITION;  Surgeon: Oris Krystal FALCON, MD;  Location: [facility] ORS;  Service: Vascular;  Laterality: Left;   CIRCUMCISION N/A 01/04/2014   Procedure: CIRCUMCISION ADULT (procedure #1);  Surgeon: Mohammad I Javaid, MD;  Location: [facility] ORS;  Service: Urology;  Laterality: N/A;   COLECTOMY N/A 05/04/2017   Procedure: TOTAL COLECTOMY;  Surgeon: Mavis Anes, MD;  Location: [facility] ORS;  Service: General;  Laterality: N/A;   COLONOSCOPY N/A 04/27/2017   Surgeon: Shaaron Lamar HERO, MD; annular mass in the ascending colon likely representing cancer biopsied, multiple 6-22 mm polyps removed, clean rectum.  Pathology with multiple tubular adenomas, high-grade dysplasia noted in ascending colon and splenic flexure.   CYSTOSCOPY W/ RETROGRADES Bilateral 06/29/2015   Procedure: CYSTOSCOPY, DILATION OF URETHRAL STRICTURE WITH BILATERAL RETROGRADE PYELOGRAM,SUPRAPUBIC TUBE CHANGE;  Surgeon: Donnice Brooks, MD;  Location: [facility] ORS;  Service: Urology;  Laterality: Bilateral;   CYSTOSCOPY WITH URETHRAL DILATATION N/A 12/29/2013   Procedure: CYSTOSCOPY WITH URETHRAL DILATATION;  Surgeon: Emery LILLETTE Blaze, MD;  Location: [facility] ORS;  Service: Urology;  Laterality: N/A;   ESOPHAGOGASTRODUODENOSCOPY N/A 04/27/2017   Procedure: ESOPHAGOGASTRODUODENOSCOPY (EGD);  Surgeon: Shaaron Lamar HERO, MD;  Location: [facility] ENDO [facility];  Service: Endoscopy;  Laterality: N/A;   FLEXIBLE SIGMOIDOSCOPY N/A 12/02/2021   Procedure: FLEXIBLE SIGMOIDOSCOPY;  Surgeon: Cindie Carlin POUR, DO;  Location: [facility] END[facility];  Service: Endoscopy;  Laterality: N/A;  10:30am, dialysis patient   INSERTION OF DIALYSIS CATHETER Right 11/25/2018   Procedure: INSERTION OF DIALYSIS CATHETER RIGHT INTERNAL JUGULAR;  Surgeon: Sheree Penne Lonni, MD;  Location: [facility] OR;  Service: Vascular;  Laterality: Right;   IR AV DIALY SHUNT INTRO NEEDLE/INTRAC  INITIAL W/PTA/STENT/IMG LT Left 02/16/2024   IR AV DIALY SHUNT INTRO NEEDLE/INTRACATH INITIAL W/PTA/IMG RIGHT Right 09/07/2018   IR FLUORO GUIDE CV LINE RIGHT  10/16/2020   IR FLUORO GUIDE CV LINE RIGHT  10/03/2022   IR REMOVAL TUN CV CATH W/O FL  01/12/2019   IR THROMBECTOMY AV FISTULA W/THROMBOLYSIS/PTA INC/SHUNT/IMG RIGHT Right 04/26/2018   IR US  GUIDE VASC ACCESS LEFT  02/17/2024   IR US  GUIDE VASC ACCESS RIGHT  04/26/2018   IR US  GUIDE VASC ACCESS RIGHT  09/07/2018   IR US  GUIDE VASC ACCESS RIGHT  10/16/2020   IR US  GUIDE VASC ACCESS RIGHT  10/16/2020   IR US  GUIDE VASC ACCESS RIGHT  10/03/2022   ORIF FEMUR FRACTURE Right 11/22/2016   Procedure: OPEN REDUCTION INTERNAL FIXATION (ORIF) DISTAL FEMUR FRACTURE;  Surgeon: Redell Shoals, MD;  Location: [facility] OR;  Service: Orthopedics;  Laterality: Right;   PATCH ANGIOPLASTY Right 12/10/2017   Procedure: PATCH ANGIOPLASTY;  Surgeon: Blade Lonni GORMAN, MD;  Location: [facility] OR;  Service: Vascular;  Laterality: Right;   PERIPHERAL VASCULAR BALLOON ANGIOPLASTY  08/13/2018   Procedure: PERIPHERAL VASCULAR BALLOON ANGIOPLASTY;  Surgeon:  Harvey Carlin BRAVO, MD;  Location: [facility] INVASIVE CV LAB;  Service: Cardiovascular;;  right AV fistula    PERIPHERAL VASCULAR BALLOON ANGIOPLASTY  11/22/2018   Procedure: PERIPHERAL VASCULAR BALLOON ANGIOPLASTY;  Surgeon: Sheree Penne Bruckner, MD;  Location: [facility] INVASIVE CV LAB;  Service: Cardiovascular;;  rt AV fistula   PERIPHERAL VASCULAR BALLOON ANGIOPLASTY  10/08/2022   Procedure: PERIPHERAL VASCULAR BALLOON ANGIOPLASTY;  Surgeon: Lanis Fonda BRAVO, MD;  Location: [facility] INVASIVE CV LAB;  Service: Cardiovascular;;  arterial anastamosis   PERIPHERAL VASCULAR CATHETERIZATION N/A 10/08/2015   Procedure: A/V Shuntogram;  Surgeon: Bruckner GORMAN Blade, MD;  Location: [facility] INVASIVE CV LAB;  Service: Cardiovascular;  Laterality: N/A;   PERIPHERAL VASCULAR INTERVENTION Left 10/08/2022   Procedure: PERIPHERAL VASCULAR  INTERVENTION;  Surgeon: Lanis Fonda BRAVO, MD;  Location: [facility] INVASIVE CV LAB;  Service: Cardiovascular;  Laterality: Left;  left AVF   REMOVAL OF A DIALYSIS CATHETER N/A 12/03/2021   Procedure: MINOR REMOVAL OF A TUNNELED DIALYSIS CATHETER;  Surgeon: Oris Krystal FALCON, MD;  Location: [facility] ORS;  Service: Vascular;  Laterality: N/A;   REMOVAL OF A DIALYSIS CATHETER N/A 11/18/2022   Procedure: MINOR REMOVAL OF A TUNNELED DIALYSIS CATHETER;  Surgeon: Oris Krystal FALCON, MD;  Location: [facility] ORS;  Service: Vascular;  Laterality: N/A;   THROMBECTOMY W/ EMBOLECTOMY Right 12/10/2017   Procedure: THROMBECTOMY REVISION RIGHT ARM  ARTERIOVENOUS FISTULA;  Surgeon: Blade Bruckner GORMAN, MD;  Location: [facility] OR;  Service: Vascular;  Laterality: Right;   TRANSURETHRAL RESECTION OF PROSTATE N/A 01/04/2014   Procedure: TRANSURETHRAL RESECTION OF THE PROSTATE (TURP) (procedure #2);  Surgeon: Mohammad I Javaid, MD;  Location: [facility] ORS;  Service: Urology;  Laterality: N/A;    Social History   Socioeconomic History   Marital status: Single    Spouse name: Not on file   Number of children: Not on file   Years of education: Not on file   Highest education level: Not on file  Occupational History   Occupation: retired   Tobacco Use   Smoking status: Never   Smokeless tobacco: Never  Vaping Use   Vaping status: Never Used  Substance and Sexual Activity   Alcohol use: No   Drug use: No   Sexual activity: Not Currently  Other Topics Concern   Not on file  Social History Narrative   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   Long term resident of [facility]    Social Drivers of Health   Financial Resource Strain: Low Risk  (02/18/2018)   Overall Financial Resource Strain (CARDIA)    Difficulty of Paying Living Expenses: Not hard at all  Food Insecurity: No Food Insecurity (02/17/2024)   Hunger Vital Sign    Worried About Running Out of Food in the Last Year: Never true    Ran Out of Food in the  Last Year: Never true  Transportation Needs: No Transportation Needs (02/17/2024)   PRAPARE - Administrator, Civil Service (Medical): No    Lack of Transportation (Non-Medical): No  Physical Activity: Unknown (09/15/2019)   Exercise Vital Sign    Days of Exercise per Week: Not on file    Minutes of Exercise per Session: 0 min  Stress: No Stress Concern Present (02/18/2018)   [employer] of Occupational Health - Occupational Stress Questionnaire    Feeling of Stress : Not at all  Social Connections: Socially Isolated (02/17/2024)   Social Connection and Isolation Panel    Frequency of Communication with Friends and  Family: Three times a week    Frequency of Social Gatherings with Friends and Family: Once a week    Attends Religious Services: Never    Database administrator or Organizations: No    Attends Banker Meetings: Never    Marital Status: Never married  Intimate Partner Violence: Not At Risk (02/17/2024)   Humiliation, Afraid, Rape, and Kick questionnaire    Fear of Current or Ex-Partner: No    Emotionally Abused: No    Physically Abused: No    Sexually Abused: No   Family History  Problem Relation Age of Onset   Cancer Mother    Colon cancer Neg Hx       VITAL SIGNS BP 114/72   Pulse 77   Temp 98.1 F (36.7 C)   Resp 20   Ht 5' 9 (1.753 m)   Wt 206 lb 6.4 oz (93.6 kg)   SpO2 99%   BMI 30.48 kg/m   Outpatient Encounter Medications as of 07/05/2024  Medication Sig Note   aspirin  81 MG chewable tablet Chew 81 mg by mouth daily.    Continuous Glucose Sensor (FREESTYLE LIBRE 2 PLUS SENSOR) MISC by Does not apply route.    insulin  glargine-yfgn (SEMGLEE , YFGN,) 100 UNIT/ML Pen Inject 8 Units into the skin at bedtime.    levothyroxine  (SYNTHROID ) 150 MCG tablet Take 150 mcg by mouth daily before breakfast.    midodrine  (PROAMATINE ) 10 MG tablet Take 10 mg by mouth See admin instructions. Send medication with resident on Tues/Thurs/Sat to  dialysis. Do not administer at [facility], give to resident to take with him to dialysis. 02/17/2024: Confirmed per [facility] - pt only receives while at dialysis on Tues/Thurs/Sat. Pt receives 2 strengths of midodrine  to equal 15mg  per dose.   midodrine  (PROAMATINE ) 5 MG tablet Take 5 mg by mouth See admin instructions. Send medication with resident on Tues/Thurs/Sat to dialysis. Do not administer at [facility], give to resident to take with him to dialysis.    Nutritional Supplements (,FEEDING SUPPLEMENT, PROSOURCE PLUS) liquid Take 30 mLs by mouth 2 (two) times daily.    Omega-3 Fatty Acids (OMEGA-3 FISH OIL PO) Take 2 capsules by mouth at bedtime. Omega 3 - DHA - EPA - Fish oil 300-1000mg  capsules    rosuvastatin  (CRESTOR ) 20 MG tablet Take 20 mg by mouth at bedtime.    sevelamer  carbonate (RENVELA ) 800 MG tablet Take 3 tablets (2,400 mg total) by mouth 3 (three) times daily with meals.    tamsulosin  (FLOMAX ) 0.4 MG CAPS capsule Take 0.4 mg by mouth at bedtime. Give 30 minutes after a meal. Do not crush or chew    torsemide  (DEMADEX ) 20 MG tablet Take 20 mg by mouth See admin instructions. Give 1 tablet (20mg ) by mouth once a day in the afternoon on Sunday, Monday, Wednesday, Friday.    UNABLE TO FIND Med Name: BD AutoShield Duo pen needle (30g x 3/16 needle)    vitamin B-12 (CYANOCOBALAMIN ) 1000 MCG tablet Take 1,000 mcg by mouth at bedtime. (Patient not taking: Reported on 07/05/2024)    No facility-administered encounter medications on file as of 07/05/2024.     SIGNIFICANT DIAGNOSTIC EXAMS  LABS REVIEWED PREVIOUS;       07-16-23: hgb A1c 5.7; chol 134; ldl 39; trig 486; hdl 24 09-03-23: tsh 3.852 free t4: 0.82 09-14-23: protein 6.9 albumin  3.2; chol 127; ldl 37 trig 420 hdl 25 12-17-23: wbc 3.9; hgb 10.6; hct 30.5; mcv 103.7 plt 110; hgb  A1c 6.6; PSA 8.02; chol 118; ldl 45; trig 222; hdl 29; tsh 10.663 02-16-24: wbc 4.3; hgb 9.3; hct 27.9; mcv 104.5 plt 81; glucose 138; bun 80; creat 17.85; k+  5.8; na++ 134; ca 9.3 gfr 2; protein 7.3 albumin  3.4 02-18-24: wbc 3.9; hgb 8.7; hct 25.5; mcv 102.4 plt 78; glucose 133; bun 52; creat 13.86; k+ 4.7; na++ 137; ca 8.7; gfr 3; protein 6.6 albumin  3.1 mag 2.5 phos 6.9  02-23-24: wbc 4.8; hgb 8.8; hct 26.8; mcv 105.9 plt 140; tsh 2.608; vitamin B 12: 4099 02-26-24:vitamin B12: 2246 03-24-24: hgb A1c 6.0 04-15-24: PSA 4.91   NO NEW LABS.     Review of Systems  Constitutional:  Negative for malaise/fatigue.  Respiratory:  Negative for cough and shortness of breath.   Cardiovascular:  Negative for chest pain, palpitations and leg swelling.  Gastrointestinal:  Negative for abdominal pain, constipation and heartburn.  Musculoskeletal:  Negative for back pain, joint pain and myalgias.  Skin: Negative.   Neurological:  Negative for dizziness.  Psychiatric/Behavioral:  The patient is not nervous/anxious.     Physical Exam Constitutional:      General: He is not in acute distress.    Appearance: He is well-developed. He is obese. He is not diaphoretic.  Neck:     Thyroid : No thyromegaly.  Cardiovascular:     Rate and Rhythm: Normal rate and regular rhythm.     Heart sounds: Normal heart sounds.  Pulmonary:     Effort: Pulmonary effort is normal. No respiratory distress.     Breath sounds: Normal breath sounds.  Abdominal:     General: Bowel sounds are normal. There is no distension.     Palpations: Abdomen is soft.     Tenderness: There is no abdominal tenderness.  Musculoskeletal:        General: Normal range of motion.     Cervical back: Neck supple.     Right lower leg: No edema.     Left lower leg: No edema.  Lymphadenopathy:     Cervical: No cervical adenopathy.  Skin:    General: Skin is warm and dry.     Comments:  Left arm fistula +thrill/bruit  Neurological:     Mental Status: He is alert. Mental status is at baseline.     Comments: SLUMS 2/30   Psychiatric:        Mood and Affect: Mood normal.          ASSESSMENT/  PLAN:  TODAY  Bilateral lower extremity edema: will continue demedex 20 mg four days weekly  2. 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 Thrombocytopenia: plt 81   PREVIOUS   4. Aortic atherosclerosis (ct 12-30-21) is on asa and statin  5. 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: on 3 days weekly; no longer on fluid restriction is unable to adhere; is on renvela  2400 mg three times daily   6. BPH: will continue flomax  0.4 mg daily   7. Neurocognitive deficits: SLUMS 2/30   8. Other specified hypothyroidism: tsh 2.608 is on synthroid  150 mcg daily will monitor   9. 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 Major depression single episode moderate: is presently off medications.   11. Elevated PSA: is lower at 4.91   12. Osteopenia: t score -  2.052 is on supplements  13. GERD without esophagitis: is off PPI  14. History of colon cancer: s/t colectomy last seen by GI on 12-19-21 ct scan done 12-30-21  15. Dyslipidemia associated with type 2 diabetes mellitus: ldl 37; trig 420; will continue crestor  20 mg daily   16. Vitamin B 12 deficiency: is on supplements  17. Secondary hyperparathyroidism of renal origin: will monitor      Barnie Seip NP [facility]   call [phone]

## 2024-07-07 DIAGNOSIS — N186 End stage renal disease: Secondary | ICD-10-CM | POA: Diagnosis not present

## 2024-07-07 DIAGNOSIS — D509 Iron deficiency anemia, unspecified: Secondary | ICD-10-CM | POA: Diagnosis not present

## 2024-07-07 DIAGNOSIS — Z992 Dependence on renal dialysis: Secondary | ICD-10-CM | POA: Diagnosis not present

## 2024-07-07 DIAGNOSIS — D631 Anemia in chronic kidney disease: Secondary | ICD-10-CM | POA: Diagnosis not present

## 2024-07-07 DIAGNOSIS — N250 Renal osteodystrophy: Secondary | ICD-10-CM | POA: Diagnosis not present

## 2024-07-07 DIAGNOSIS — N2581 Secondary hyperparathyroidism of renal origin: Secondary | ICD-10-CM | POA: Diagnosis not present

## 2024-07-09 DIAGNOSIS — N2581 Secondary hyperparathyroidism of renal origin: Secondary | ICD-10-CM | POA: Diagnosis not present

## 2024-07-09 DIAGNOSIS — D509 Iron deficiency anemia, unspecified: Secondary | ICD-10-CM | POA: Diagnosis not present

## 2024-07-09 DIAGNOSIS — Z992 Dependence on renal dialysis: Secondary | ICD-10-CM | POA: Diagnosis not present

## 2024-07-09 DIAGNOSIS — D631 Anemia in chronic kidney disease: Secondary | ICD-10-CM | POA: Diagnosis not present

## 2024-07-09 DIAGNOSIS — N250 Renal osteodystrophy: Secondary | ICD-10-CM | POA: Diagnosis not present

## 2024-07-09 DIAGNOSIS — N186 End stage renal disease: Secondary | ICD-10-CM | POA: Diagnosis not present

## 2024-07-12 DIAGNOSIS — F4322 Adjustment disorder with anxiety: Secondary | ICD-10-CM | POA: Diagnosis not present

## 2024-07-12 DIAGNOSIS — N2581 Secondary hyperparathyroidism of renal origin: Secondary | ICD-10-CM | POA: Diagnosis not present

## 2024-07-12 DIAGNOSIS — Z992 Dependence on renal dialysis: Secondary | ICD-10-CM | POA: Diagnosis not present

## 2024-07-12 DIAGNOSIS — F70 Mild intellectual disabilities: Secondary | ICD-10-CM | POA: Diagnosis not present

## 2024-07-12 DIAGNOSIS — D509 Iron deficiency anemia, unspecified: Secondary | ICD-10-CM | POA: Diagnosis not present

## 2024-07-12 DIAGNOSIS — N186 End stage renal disease: Secondary | ICD-10-CM | POA: Diagnosis not present

## 2024-07-12 DIAGNOSIS — D631 Anemia in chronic kidney disease: Secondary | ICD-10-CM | POA: Diagnosis not present

## 2024-07-12 DIAGNOSIS — N250 Renal osteodystrophy: Secondary | ICD-10-CM | POA: Diagnosis not present

## 2024-07-14 DIAGNOSIS — D509 Iron deficiency anemia, unspecified: Secondary | ICD-10-CM | POA: Diagnosis not present

## 2024-07-14 DIAGNOSIS — N2581 Secondary hyperparathyroidism of renal origin: Secondary | ICD-10-CM | POA: Diagnosis not present

## 2024-07-14 DIAGNOSIS — Z992 Dependence on renal dialysis: Secondary | ICD-10-CM | POA: Diagnosis not present

## 2024-07-14 DIAGNOSIS — D631 Anemia in chronic kidney disease: Secondary | ICD-10-CM | POA: Diagnosis not present

## 2024-07-14 DIAGNOSIS — N186 End stage renal disease: Secondary | ICD-10-CM | POA: Diagnosis not present

## 2024-07-14 DIAGNOSIS — N250 Renal osteodystrophy: Secondary | ICD-10-CM | POA: Diagnosis not present

## 2024-07-15 DIAGNOSIS — Z992 Dependence on renal dialysis: Secondary | ICD-10-CM | POA: Diagnosis not present

## 2024-07-15 DIAGNOSIS — D631 Anemia in chronic kidney disease: Secondary | ICD-10-CM | POA: Diagnosis not present

## 2024-07-15 DIAGNOSIS — N250 Renal osteodystrophy: Secondary | ICD-10-CM | POA: Diagnosis not present

## 2024-07-15 DIAGNOSIS — N2581 Secondary hyperparathyroidism of renal origin: Secondary | ICD-10-CM | POA: Diagnosis not present

## 2024-07-15 DIAGNOSIS — N186 End stage renal disease: Secondary | ICD-10-CM | POA: Diagnosis not present

## 2024-07-15 DIAGNOSIS — D509 Iron deficiency anemia, unspecified: Secondary | ICD-10-CM | POA: Diagnosis not present

## 2024-07-16 DIAGNOSIS — N250 Renal osteodystrophy: Secondary | ICD-10-CM | POA: Diagnosis not present

## 2024-07-16 DIAGNOSIS — N186 End stage renal disease: Secondary | ICD-10-CM | POA: Diagnosis not present

## 2024-07-16 DIAGNOSIS — Z992 Dependence on renal dialysis: Secondary | ICD-10-CM | POA: Diagnosis not present

## 2024-07-16 DIAGNOSIS — N2581 Secondary hyperparathyroidism of renal origin: Secondary | ICD-10-CM | POA: Diagnosis not present

## 2024-07-16 DIAGNOSIS — D631 Anemia in chronic kidney disease: Secondary | ICD-10-CM | POA: Diagnosis not present

## 2024-07-16 DIAGNOSIS — D509 Iron deficiency anemia, unspecified: Secondary | ICD-10-CM | POA: Diagnosis not present

## 2024-07-19 DIAGNOSIS — N2581 Secondary hyperparathyroidism of renal origin: Secondary | ICD-10-CM | POA: Diagnosis not present

## 2024-07-19 DIAGNOSIS — Z992 Dependence on renal dialysis: Secondary | ICD-10-CM | POA: Diagnosis not present

## 2024-07-19 DIAGNOSIS — N250 Renal osteodystrophy: Secondary | ICD-10-CM | POA: Diagnosis not present

## 2024-07-19 DIAGNOSIS — N186 End stage renal disease: Secondary | ICD-10-CM | POA: Diagnosis not present

## 2024-07-19 DIAGNOSIS — D509 Iron deficiency anemia, unspecified: Secondary | ICD-10-CM | POA: Diagnosis not present

## 2024-07-19 DIAGNOSIS — D631 Anemia in chronic kidney disease: Secondary | ICD-10-CM | POA: Diagnosis not present

## 2024-07-21 DIAGNOSIS — D631 Anemia in chronic kidney disease: Secondary | ICD-10-CM | POA: Diagnosis not present

## 2024-07-21 DIAGNOSIS — N250 Renal osteodystrophy: Secondary | ICD-10-CM | POA: Diagnosis not present

## 2024-07-21 DIAGNOSIS — N186 End stage renal disease: Secondary | ICD-10-CM | POA: Diagnosis not present

## 2024-07-21 DIAGNOSIS — N2581 Secondary hyperparathyroidism of renal origin: Secondary | ICD-10-CM | POA: Diagnosis not present

## 2024-07-21 DIAGNOSIS — Z992 Dependence on renal dialysis: Secondary | ICD-10-CM | POA: Diagnosis not present

## 2024-07-21 DIAGNOSIS — D509 Iron deficiency anemia, unspecified: Secondary | ICD-10-CM | POA: Diagnosis not present

## 2024-07-23 DIAGNOSIS — Z992 Dependence on renal dialysis: Secondary | ICD-10-CM | POA: Diagnosis not present

## 2024-07-23 DIAGNOSIS — D509 Iron deficiency anemia, unspecified: Secondary | ICD-10-CM | POA: Diagnosis not present

## 2024-07-23 DIAGNOSIS — D631 Anemia in chronic kidney disease: Secondary | ICD-10-CM | POA: Diagnosis not present

## 2024-07-23 DIAGNOSIS — N250 Renal osteodystrophy: Secondary | ICD-10-CM | POA: Diagnosis not present

## 2024-07-23 DIAGNOSIS — N186 End stage renal disease: Secondary | ICD-10-CM | POA: Diagnosis not present

## 2024-07-23 DIAGNOSIS — N2581 Secondary hyperparathyroidism of renal origin: Secondary | ICD-10-CM | POA: Diagnosis not present

## 2024-07-26 DIAGNOSIS — D509 Iron deficiency anemia, unspecified: Secondary | ICD-10-CM | POA: Diagnosis not present

## 2024-07-26 DIAGNOSIS — N250 Renal osteodystrophy: Secondary | ICD-10-CM | POA: Diagnosis not present

## 2024-07-26 DIAGNOSIS — D631 Anemia in chronic kidney disease: Secondary | ICD-10-CM | POA: Diagnosis not present

## 2024-07-26 DIAGNOSIS — Z992 Dependence on renal dialysis: Secondary | ICD-10-CM | POA: Diagnosis not present

## 2024-07-26 DIAGNOSIS — N186 End stage renal disease: Secondary | ICD-10-CM | POA: Diagnosis not present

## 2024-07-26 DIAGNOSIS — N2581 Secondary hyperparathyroidism of renal origin: Secondary | ICD-10-CM | POA: Diagnosis not present

## 2024-07-27 ENCOUNTER — Encounter: Payer: Self-pay | Admitting: Internal Medicine

## 2024-07-27 ENCOUNTER — Non-Acute Institutional Stay (SKILLED_NURSING_FACILITY): Payer: Self-pay | Admitting: Internal Medicine

## 2024-07-27 DIAGNOSIS — E034 Atrophy of thyroid (acquired): Secondary | ICD-10-CM | POA: Diagnosis not present

## 2024-07-27 DIAGNOSIS — E1122 Type 2 diabetes mellitus with diabetic chronic kidney disease: Secondary | ICD-10-CM

## 2024-07-27 DIAGNOSIS — N186 End stage renal disease: Secondary | ICD-10-CM

## 2024-07-27 DIAGNOSIS — D509 Iron deficiency anemia, unspecified: Secondary | ICD-10-CM | POA: Diagnosis not present

## 2024-07-27 DIAGNOSIS — N2581 Secondary hyperparathyroidism of renal origin: Secondary | ICD-10-CM | POA: Diagnosis not present

## 2024-07-27 DIAGNOSIS — D631 Anemia in chronic kidney disease: Secondary | ICD-10-CM | POA: Diagnosis not present

## 2024-07-27 DIAGNOSIS — E538 Deficiency of other specified B group vitamins: Secondary | ICD-10-CM

## 2024-07-27 DIAGNOSIS — I120 Hypertensive chronic kidney disease with stage 5 chronic kidney disease or end stage renal disease: Secondary | ICD-10-CM | POA: Diagnosis not present

## 2024-07-27 DIAGNOSIS — Z992 Dependence on renal dialysis: Secondary | ICD-10-CM | POA: Diagnosis not present

## 2024-07-27 DIAGNOSIS — N250 Renal osteodystrophy: Secondary | ICD-10-CM | POA: Diagnosis not present

## 2024-07-27 NOTE — Patient Instructions (Signed)
 See assessment and plan under each diagnosis in the problem list and acutely for this visit

## 2024-07-27 NOTE — Assessment & Plan Note (Addendum)
 Current B12 level is super therapeutic at 2246.  Continue to monitor.  If B12 level remains above 1000; B12 supplementation dose could be decreased to 500 mcg daily.

## 2024-07-27 NOTE — Progress Notes (Signed)
 NURSING HOME LOCATION:  [facility] ROOM NUMBER:  112 D  CODE STATUS:  Full Code  PCP: Landy Barnie RAMAN, NP   This is a nursing facility follow up visit of chronic medical diagnoses to document compliance with Regulation 483.30 (c) in The Long Term Care Survey Manual Phase 2 which mandates caregiver visit ( visits can alternate among physician, PA or NP as per statutes) within 10 days of 30 days / 60 days/ 90 days post admission to SNF date  .  Interim medical record and care since last SNF visit was updated with review of diagnostic studies and change in clinical status since last visit were documented.  HPI: He is a permanent resident of this facility with insulin -dependent diabetes with ESRD on hemodialysis; hypothyroidism; dyslipidemia; anemia due to B12 deficiency ; prostatism; GERD; protein caloric malnutrition; and history of colorectal adenocarcinoma. 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  Nephrologist Dr. Woodward Brought saw the patient 05/24/2024.  Labs are performed Tuesday, Thursday, and Saturday at hemodialysis.  B12 level was supratherapeutic at 2246; TSH therapeutic at 2.62; and A1c 6.0% in May of this year. On 6/20 PSA was high normal at 4.91. Most recent care plan meeting was 05/26/2024.BIMS MMSE score was 6 out of 15 and mood scored 0 out of 30.  He has been mobilizing in a wheelchair without falls.  He requires independent assist with ADL care.  He is occasionally incontinent of urine.  Nutritional intake is typically 75 to 100%.  Review of systems queries were answered with monosyllabic no.  He denies any active symptoms  Constitutional: No fever, significant weight change, fatigue  Eyes: No redness, discharge, pain, vision change ENT/mouth: No nasal congestion,  purulent discharge, earache, change in hearing, sore throat  Cardiovascular: No chest pain, palpitations, paroxysmal nocturnal  dyspnea, edema  Respiratory: No cough, sputum production, hemoptysis, DOE, significant snoring, apnea   Gastrointestinal: No heartburn, dysphagia, abdominal pain, nausea /vomiting, rectal bleeding, melena, change in bowels Genitourinary: No dysuria, hematuria, pyuria, incontinence, nocturia Musculoskeletal: No joint stiffness, joint swelling, weakness, pain Dermatologic: No rash, pruritus, change in appearance of skin Neurologic: No dizziness, headache, syncope, seizures, numbness, tingling Psychiatric: No significant anxiety, depression, insomnia, anorexia Endocrine: No change in hair/skin/nails, excessive thirst, excessive hunger, excessive urination  Hematologic/lymphatic: No significant bruising, lymphadenopathy, abnormal bleeding Allergy/immunology: No itchy/watery eyes, significant sneezing, urticaria, angioedema  Physical exam:  Pertinent or positive findings: As is usually the case he was in the wheelchair.  Facies tend to be blank.  Speech tends to be somewhat hyponasal and mimics baby talk.  Slight ptosis is noted on the right.  Sclera are slightly injected without frank scleritis.  Arcus senilis suggested.  Mandibular macrognathia is suggested.  The teeth are grossly encrusted with plaque diffusely.  A brief grade 1/2-3/4 systolic murmur is present at the right base with slight increase in S2.  Breath sounds are decreased.  Abdomen is protuberant.  Pedal pulses are decreased.  General appearance: Adequately nourished; no acute distress, increased work of breathing is present.   Lymphatic: No lymphadenopathy about the head, neck, axilla. Eyes: No conjunctival inflammation or lid edema is present. There is no scleral icterus. Ears:  External ear exam shows no significant lesions or deformities.   Nose:  External nasal examination shows no deformity or inflammation. Nasal mucosa are pink and moist without lesions, exudates Neck:  No thyromegaly, masses, tenderness noted.    Heart:  Normal rate and regular rhythm without gallop, click, rub .  Lungs:  without wheezes, rhonchi, rales, rubs. Abdomen: Bowel sounds are normal. Abdomen is soft and nontender with no organomegaly, hernias, masses. GU: Deferred  Extremities:  No cyanosis, clubbing, edema  Neurologic exam :Balance, Rhomberg, finger to nose testing could not be completed due to clinical state Skin: Warm & dry w/o tenting. No significant lesions or rash.  See summary under each active problem in the Problem List with associated updated therapeutic plan :  Type 2 diabetes mellitus with hypertension and end stage renal disease on dialysis (HCC) A1c was 6% in May of this year.  He is on low-dose insulin , 8 units daily.  Discordance between A1c and glucoses can be anticipated with his ESRD. BP controlled; no change in antihypertensive medications.  He continues to get midodrine  at hemodialysis because of hypotension associated with the sessions.   Vitamin B 12 deficiency Current B12 level is super therapeutic at 2246.  Continue to monitor.  If B12 level remains above 1000; B12 supplementation dose could be decreased to 500 mcg daily.  Hypothyroidism Current TSH is therapeutic at 2.62; no change indicated.  Continue TSH monitor as TSH was significantly elevated at 10.633 in February of this year.  Serially it has been stable & therapeutic after increase in the L-thyroxine dose.

## 2024-07-27 NOTE — Assessment & Plan Note (Addendum)
 A1c was 6% in May of this year.  He is on low-dose insulin , 8 units daily.  Discordance between A1c and glucoses can be anticipated with his ESRD. BP controlled; no change in antihypertensive medications.  He continues to get midodrine  at hemodialysis because of hypotension associated with the sessions.

## 2024-07-27 NOTE — Assessment & Plan Note (Addendum)
 Current TSH is therapeutic at 2.62; no change indicated.  Continue TSH monitor as TSH was significantly elevated at 10.633 in February of this year.  Serially it has been stable & therapeutic after increase in the L-thyroxine dose.

## 2024-07-28 DIAGNOSIS — Z992 Dependence on renal dialysis: Secondary | ICD-10-CM | POA: Diagnosis not present

## 2024-07-28 DIAGNOSIS — N186 End stage renal disease: Secondary | ICD-10-CM | POA: Diagnosis not present

## 2024-07-28 DIAGNOSIS — Z23 Encounter for immunization: Secondary | ICD-10-CM | POA: Diagnosis not present

## 2024-07-28 DIAGNOSIS — N2581 Secondary hyperparathyroidism of renal origin: Secondary | ICD-10-CM | POA: Diagnosis not present

## 2024-07-28 DIAGNOSIS — D509 Iron deficiency anemia, unspecified: Secondary | ICD-10-CM | POA: Diagnosis not present

## 2024-07-28 DIAGNOSIS — N250 Renal osteodystrophy: Secondary | ICD-10-CM | POA: Diagnosis not present

## 2024-07-28 DIAGNOSIS — D631 Anemia in chronic kidney disease: Secondary | ICD-10-CM | POA: Diagnosis not present

## 2024-07-30 DIAGNOSIS — N2581 Secondary hyperparathyroidism of renal origin: Secondary | ICD-10-CM | POA: Diagnosis not present

## 2024-07-30 DIAGNOSIS — N186 End stage renal disease: Secondary | ICD-10-CM | POA: Diagnosis not present

## 2024-07-30 DIAGNOSIS — N250 Renal osteodystrophy: Secondary | ICD-10-CM | POA: Diagnosis not present

## 2024-07-30 DIAGNOSIS — Z992 Dependence on renal dialysis: Secondary | ICD-10-CM | POA: Diagnosis not present

## 2024-07-30 DIAGNOSIS — D631 Anemia in chronic kidney disease: Secondary | ICD-10-CM | POA: Diagnosis not present

## 2024-07-30 DIAGNOSIS — D509 Iron deficiency anemia, unspecified: Secondary | ICD-10-CM | POA: Diagnosis not present

## 2024-08-01 DIAGNOSIS — E1151 Type 2 diabetes mellitus with diabetic peripheral angiopathy without gangrene: Secondary | ICD-10-CM | POA: Diagnosis not present

## 2024-08-01 DIAGNOSIS — I739 Peripheral vascular disease, unspecified: Secondary | ICD-10-CM | POA: Diagnosis not present

## 2024-08-01 DIAGNOSIS — L602 Onychogryphosis: Secondary | ICD-10-CM | POA: Diagnosis not present

## 2024-08-01 DIAGNOSIS — L603 Nail dystrophy: Secondary | ICD-10-CM | POA: Diagnosis not present

## 2024-08-02 DIAGNOSIS — Z992 Dependence on renal dialysis: Secondary | ICD-10-CM | POA: Diagnosis not present

## 2024-08-02 DIAGNOSIS — D631 Anemia in chronic kidney disease: Secondary | ICD-10-CM | POA: Diagnosis not present

## 2024-08-02 DIAGNOSIS — N186 End stage renal disease: Secondary | ICD-10-CM | POA: Diagnosis not present

## 2024-08-02 DIAGNOSIS — N2581 Secondary hyperparathyroidism of renal origin: Secondary | ICD-10-CM | POA: Diagnosis not present

## 2024-08-02 DIAGNOSIS — N250 Renal osteodystrophy: Secondary | ICD-10-CM | POA: Diagnosis not present

## 2024-08-02 DIAGNOSIS — D509 Iron deficiency anemia, unspecified: Secondary | ICD-10-CM | POA: Diagnosis not present

## 2024-08-04 DIAGNOSIS — Z992 Dependence on renal dialysis: Secondary | ICD-10-CM | POA: Diagnosis not present

## 2024-08-04 DIAGNOSIS — N250 Renal osteodystrophy: Secondary | ICD-10-CM | POA: Diagnosis not present

## 2024-08-04 DIAGNOSIS — D509 Iron deficiency anemia, unspecified: Secondary | ICD-10-CM | POA: Diagnosis not present

## 2024-08-04 DIAGNOSIS — N186 End stage renal disease: Secondary | ICD-10-CM | POA: Diagnosis not present

## 2024-08-04 DIAGNOSIS — D631 Anemia in chronic kidney disease: Secondary | ICD-10-CM | POA: Diagnosis not present

## 2024-08-04 DIAGNOSIS — N2581 Secondary hyperparathyroidism of renal origin: Secondary | ICD-10-CM | POA: Diagnosis not present

## 2024-08-06 DIAGNOSIS — N2581 Secondary hyperparathyroidism of renal origin: Secondary | ICD-10-CM | POA: Diagnosis not present

## 2024-08-06 DIAGNOSIS — N250 Renal osteodystrophy: Secondary | ICD-10-CM | POA: Diagnosis not present

## 2024-08-06 DIAGNOSIS — Z992 Dependence on renal dialysis: Secondary | ICD-10-CM | POA: Diagnosis not present

## 2024-08-06 DIAGNOSIS — D509 Iron deficiency anemia, unspecified: Secondary | ICD-10-CM | POA: Diagnosis not present

## 2024-08-06 DIAGNOSIS — D631 Anemia in chronic kidney disease: Secondary | ICD-10-CM | POA: Diagnosis not present

## 2024-08-06 DIAGNOSIS — N186 End stage renal disease: Secondary | ICD-10-CM | POA: Diagnosis not present

## 2024-08-09 DIAGNOSIS — D509 Iron deficiency anemia, unspecified: Secondary | ICD-10-CM | POA: Diagnosis not present

## 2024-08-09 DIAGNOSIS — Z992 Dependence on renal dialysis: Secondary | ICD-10-CM | POA: Diagnosis not present

## 2024-08-09 DIAGNOSIS — D631 Anemia in chronic kidney disease: Secondary | ICD-10-CM | POA: Diagnosis not present

## 2024-08-09 DIAGNOSIS — N2581 Secondary hyperparathyroidism of renal origin: Secondary | ICD-10-CM | POA: Diagnosis not present

## 2024-08-09 DIAGNOSIS — N186 End stage renal disease: Secondary | ICD-10-CM | POA: Diagnosis not present

## 2024-08-09 DIAGNOSIS — N250 Renal osteodystrophy: Secondary | ICD-10-CM | POA: Diagnosis not present

## 2024-08-10 DIAGNOSIS — Z23 Encounter for immunization: Secondary | ICD-10-CM | POA: Diagnosis not present

## 2024-08-11 DIAGNOSIS — D509 Iron deficiency anemia, unspecified: Secondary | ICD-10-CM | POA: Diagnosis not present

## 2024-08-11 DIAGNOSIS — N250 Renal osteodystrophy: Secondary | ICD-10-CM | POA: Diagnosis not present

## 2024-08-11 DIAGNOSIS — N186 End stage renal disease: Secondary | ICD-10-CM | POA: Diagnosis not present

## 2024-08-11 DIAGNOSIS — N2581 Secondary hyperparathyroidism of renal origin: Secondary | ICD-10-CM | POA: Diagnosis not present

## 2024-08-11 DIAGNOSIS — D631 Anemia in chronic kidney disease: Secondary | ICD-10-CM | POA: Diagnosis not present

## 2024-08-11 DIAGNOSIS — Z992 Dependence on renal dialysis: Secondary | ICD-10-CM | POA: Diagnosis not present

## 2024-08-13 DIAGNOSIS — N186 End stage renal disease: Secondary | ICD-10-CM | POA: Diagnosis not present

## 2024-08-13 DIAGNOSIS — N2581 Secondary hyperparathyroidism of renal origin: Secondary | ICD-10-CM | POA: Diagnosis not present

## 2024-08-13 DIAGNOSIS — N250 Renal osteodystrophy: Secondary | ICD-10-CM | POA: Diagnosis not present

## 2024-08-13 DIAGNOSIS — Z992 Dependence on renal dialysis: Secondary | ICD-10-CM | POA: Diagnosis not present

## 2024-08-13 DIAGNOSIS — D631 Anemia in chronic kidney disease: Secondary | ICD-10-CM | POA: Diagnosis not present

## 2024-08-13 DIAGNOSIS — D509 Iron deficiency anemia, unspecified: Secondary | ICD-10-CM | POA: Diagnosis not present

## 2024-08-16 DIAGNOSIS — N250 Renal osteodystrophy: Secondary | ICD-10-CM | POA: Diagnosis not present

## 2024-08-16 DIAGNOSIS — N2581 Secondary hyperparathyroidism of renal origin: Secondary | ICD-10-CM | POA: Diagnosis not present

## 2024-08-16 DIAGNOSIS — F70 Mild intellectual disabilities: Secondary | ICD-10-CM | POA: Diagnosis not present

## 2024-08-16 DIAGNOSIS — D509 Iron deficiency anemia, unspecified: Secondary | ICD-10-CM | POA: Diagnosis not present

## 2024-08-16 DIAGNOSIS — F4322 Adjustment disorder with anxiety: Secondary | ICD-10-CM | POA: Diagnosis not present

## 2024-08-16 DIAGNOSIS — N186 End stage renal disease: Secondary | ICD-10-CM | POA: Diagnosis not present

## 2024-08-16 DIAGNOSIS — D631 Anemia in chronic kidney disease: Secondary | ICD-10-CM | POA: Diagnosis not present

## 2024-08-16 DIAGNOSIS — Z992 Dependence on renal dialysis: Secondary | ICD-10-CM | POA: Diagnosis not present

## 2024-08-18 DIAGNOSIS — N250 Renal osteodystrophy: Secondary | ICD-10-CM | POA: Diagnosis not present

## 2024-08-18 DIAGNOSIS — D631 Anemia in chronic kidney disease: Secondary | ICD-10-CM | POA: Diagnosis not present

## 2024-08-18 DIAGNOSIS — D509 Iron deficiency anemia, unspecified: Secondary | ICD-10-CM | POA: Diagnosis not present

## 2024-08-18 DIAGNOSIS — Z992 Dependence on renal dialysis: Secondary | ICD-10-CM | POA: Diagnosis not present

## 2024-08-18 DIAGNOSIS — N2581 Secondary hyperparathyroidism of renal origin: Secondary | ICD-10-CM | POA: Diagnosis not present

## 2024-08-18 DIAGNOSIS — N186 End stage renal disease: Secondary | ICD-10-CM | POA: Diagnosis not present

## 2024-08-19 ENCOUNTER — Non-Acute Institutional Stay (SKILLED_NURSING_FACILITY): Payer: Self-pay | Admitting: Adult Health

## 2024-08-19 ENCOUNTER — Encounter: Payer: Self-pay | Admitting: Adult Health

## 2024-08-19 DIAGNOSIS — Z992 Dependence on renal dialysis: Secondary | ICD-10-CM

## 2024-08-19 DIAGNOSIS — E1122 Type 2 diabetes mellitus with diabetic chronic kidney disease: Secondary | ICD-10-CM

## 2024-08-19 DIAGNOSIS — Z794 Long term (current) use of insulin: Secondary | ICD-10-CM | POA: Diagnosis not present

## 2024-08-19 DIAGNOSIS — N2581 Secondary hyperparathyroidism of renal origin: Secondary | ICD-10-CM

## 2024-08-19 DIAGNOSIS — N186 End stage renal disease: Secondary | ICD-10-CM

## 2024-08-19 NOTE — Progress Notes (Signed)
 Location:  [facility] Room Number: 111 Place of Service:  SNF (31)   CODE STATUS: full code   No Known Allergies  Chief Complaint  Patient presents with   Acute Visit    Care plan meeting     HPI:  We have come together for her care plan meeting. Family present. BIMS 6/15 mood 0/30. Using wheelchair without falls.  He is independent with his adl care. He is continent of bladder and bowel. Dietary: feeds self; regular diet; appetite 76-100%; weight is 208 pounds.  Therapy: none at this time.  Activities: does participate. 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   Past Medical History:  Diagnosis Date   Abnormal CT scan, kidney 10/06/2011   Acute pyelonephritis 10/07/2011   Anemia    normocytic   Anxiety    mental retardation   Bladder wall thickening 10/06/2011   BPH (benign prostatic hypertrophy)    Diabetes mellitus    Dialysis patient    Tuesday, Thursday and Saturday,    DVT of leg (deep venous thrombosis) (HCC) 12/25/2016   Edema     history of lower extremity edema   GERD (gastroesophageal reflux disease)    Heme positive stool    Hydronephrosis    Hyperkalemia    Hyperlipidemia    Hypernatremia    Hypertension    Hypothyroidism    Impaired speech    Infected prosthetic vascular graft    MR (mental retardation)    Muscle weakness    Obstructive uropathy    Perinephric abscess 10/07/2011   Poor historian poor historian   Primary colorectal adenocarcinoma (HCC) 04/2017   S/p total colectomy   Protein calorie malnutrition    Pyelonephritis    Renal failure (ARF), acute on chronic    Renal insufficiency    chronic history   Sepsis (HCC)    Smoking    Uremia    Urinary retention    UTI (lower urinary tract infection) 10/06/2011    Past Surgical History:  Procedure  Laterality Date   A/V FISTULAGRAM N/A 08/13/2018   Procedure: A/V FISTULAGRAM - Right Upper;  Surgeon: Harvey Carlin BRAVO, MD;  Location: [facility] INVASIVE CV LAB;  Service: Cardiovascular;  Laterality: N/A;   A/V FISTULAGRAM N/A 11/22/2018   Procedure: A/V FISTULAGRAM - Right Upper;  Surgeon: Sheree Penne Bruckner, MD;  Location: [facility] INVASIVE CV LAB;  Service: Cardiovascular;  Laterality: N/A;   A/V FISTULAGRAM Left 10/08/2022   Procedure: A/V Fistulagram;  Surgeon: Lanis Fonda BRAVO, MD;  Location: [facility] INVASIVE CV LAB;  Service: Cardiovascular;  Laterality: Left;   A/V FISTULAGRAM Left 05/06/2023   Procedure: A/V Fistulagram;  Surgeon: Magda Debby SAILOR, MD;  Location: [facility] INVASIVE CV LAB;  Service: Cardiovascular;  Laterality: Left;   AV FISTULA PLACEMENT Left 07/06/2015   Procedure:  INSERTION LEFT ARM ARTERIOVENOUS GORTEX GRAFT;  Surgeon: Bruckner GORMAN Blade, MD;  Location: [facility] OR;  Service: Vascular;  Laterality: Left;   AV FISTULA PLACEMENT Right 02/26/2016   Procedure: ARTERIOVENOUS (AV) FISTULA CREATION ;  Surgeon: Bruckner GORMAN Blade, MD;  Location: [facility] OR;  Service: Vascular;  Laterality: Right;   AV FISTULA PLACEMENT Right 11/25/2018   Procedure: INSERTION OF ARTERIOVENOUS (AV) ARTEGRAFT RIGHT UPPER ARM;  Surgeon: Sheree Penne Bruckner, MD;  Location: [facility] OR;  Service: Vascular;  Laterality: Right;   AV FISTULA PLACEMENT Left 05/28/2021   Procedure: LEFT ARM ARTERIOVENOUS (AV) FISTULA;  Surgeon: Oris Krystal FALCON, MD;  Location: [facility] ORS;  Service: Vascular;  Laterality: Left;   AVGG REMOVAL Left 10/09/2015   Procedure: REMOVAL OF ARTERIOVENOUS GORETEX GRAFT (AVGG) Evacuation of Lymphocele, Vein Patch angioplasty of brachial artery.;  Surgeon: Lonni GORMAN Blade, MD;  Location: [facility] OR;  Service: Vascular;  Laterality: Left;   BASCILIC VEIN TRANSPOSITION Right 02/26/2016   Procedure: Right BASCILIC VEIN TRANSPOSITION;  Surgeon: Lonni GORMAN Blade, MD;  Location: [facility] OR;  Service: Vascular;   Laterality: Right;   BASCILIC VEIN TRANSPOSITION Left 07/30/2021   Procedure: LEFT ARM SECOND STAGE BASILIC VEIN TRANSPOSITION;  Surgeon: Oris Krystal FALCON, MD;  Location: [facility] ORS;  Service: Vascular;  Laterality: Left;   CIRCUMCISION N/A 01/04/2014   Procedure: CIRCUMCISION ADULT (procedure #1);  Surgeon: Mohammad I Javaid, MD;  Location: [facility] ORS;  Service: Urology;  Laterality: N/A;   COLECTOMY N/A 05/04/2017   Procedure: TOTAL COLECTOMY;  Surgeon: Mavis Anes, MD;  Location: [facility] ORS;  Service: General;  Laterality: N/A;   COLONOSCOPY N/A 04/27/2017   Surgeon: Shaaron Lamar HERO, MD; annular mass in the ascending colon likely representing cancer biopsied, multiple 6-22 mm polyps removed, clean rectum.  Pathology with multiple tubular adenomas, high-grade dysplasia noted in ascending colon and splenic flexure.   CYSTOSCOPY W/ RETROGRADES Bilateral 06/29/2015   Procedure: CYSTOSCOPY, DILATION OF URETHRAL STRICTURE WITH BILATERAL RETROGRADE PYELOGRAM,SUPRAPUBIC TUBE CHANGE;  Surgeon: Donnice Brooks, MD;  Location: [facility] ORS;  Service: Urology;  Laterality: Bilateral;   CYSTOSCOPY WITH URETHRAL DILATATION N/A 12/29/2013   Procedure: CYSTOSCOPY WITH URETHRAL DILATATION;  Surgeon: Emery LILLETTE Blaze, MD;  Location: [facility] ORS;  Service: Urology;  Laterality: N/A;   ESOPHAGOGASTRODUODENOSCOPY N/A 04/27/2017   Procedure: ESOPHAGOGASTRODUODENOSCOPY (EGD);  Surgeon: Shaaron Lamar HERO, MD;  Location: [facility];  Service: Endoscopy;  Laterality: N/A;   FLEXIBLE SIGMOIDOSCOPY N/A 12/02/2021   Procedure: FLEXIBLE SIGMOIDOSCOPY;  Surgeon: Cindie Carlin POUR, DO;  Location: [facility];  Service: Endoscopy;  Laterality: N/A;  10:30am, dialysis patient   INSERTION OF DIALYSIS CATHETER Right 11/25/2018   Procedure: INSERTION OF DIALYSIS CATHETER RIGHT INTERNAL JUGULAR;  Surgeon: Sheree Penne Lonni, MD;  Location: [facility] OR;  Service: Vascular;  Laterality: Right;   IR AV DIALY SHUNT INTRO NEEDLE/INTRAC INITIAL  W/PTA/STENT/IMG LT Left 02/16/2024   IR AV DIALY SHUNT INTRO NEEDLE/INTRACATH INITIAL W/PTA/IMG RIGHT Right 09/07/2018   IR FLUORO GUIDE CV LINE RIGHT  10/16/2020   IR FLUORO GUIDE CV LINE RIGHT  10/03/2022   IR REMOVAL TUN CV CATH W/O FL  01/12/2019   IR THROMBECTOMY AV FISTULA W/THROMBOLYSIS/PTA INC/SHUNT/IMG RIGHT Right 04/26/2018   IR US  GUIDE VASC ACCESS LEFT  02/17/2024   IR US  GUIDE VASC ACCESS RIGHT  04/26/2018   IR US  GUIDE VASC ACCESS RIGHT  09/07/2018   IR US  GUIDE VASC ACCESS RIGHT  10/16/2020   IR US  GUIDE VASC ACCESS RIGHT  10/16/2020   IR US  GUIDE VASC ACCESS RIGHT  10/03/2022   ORIF FEMUR FRACTURE Right 11/22/2016   Procedure: OPEN REDUCTION INTERNAL FIXATION (ORIF) DISTAL FEMUR FRACTURE;  Surgeon: Redell Shoals, MD;  Location: [facility] OR;  Service: Orthopedics;  Laterality: Right;   PATCH ANGIOPLASTY Right 12/10/2017   Procedure: PATCH ANGIOPLASTY;  Surgeon: Blade Lonni GORMAN, MD;  Location: [facility] OR;  Service: Vascular;  Laterality: Right;   PERIPHERAL VASCULAR BALLOON ANGIOPLASTY  08/13/2018   Procedure: PERIPHERAL VASCULAR BALLOON ANGIOPLASTY;  Surgeon:  Harvey Carlin BRAVO, MD;  Location: [facility] INVASIVE CV LAB;  Service: Cardiovascular;;  right AV fistula    PERIPHERAL VASCULAR BALLOON ANGIOPLASTY  11/22/2018   Procedure: PERIPHERAL VASCULAR BALLOON ANGIOPLASTY;  Surgeon: Sheree Penne Bruckner, MD;  Location: [facility] INVASIVE CV LAB;  Service: Cardiovascular;;  rt AV fistula   PERIPHERAL VASCULAR BALLOON ANGIOPLASTY  10/08/2022   Procedure: PERIPHERAL VASCULAR BALLOON ANGIOPLASTY;  Surgeon: Lanis Fonda BRAVO, MD;  Location: [facility] INVASIVE CV LAB;  Service: Cardiovascular;;  arterial anastamosis   PERIPHERAL VASCULAR CATHETERIZATION N/A 10/08/2015   Procedure: A/V Shuntogram;  Surgeon: Bruckner GORMAN Blade, MD;  Location: [facility] INVASIVE CV LAB;  Service: Cardiovascular;  Laterality: N/A;   PERIPHERAL VASCULAR INTERVENTION Left 10/08/2022   Procedure: PERIPHERAL VASCULAR INTERVENTION;   Surgeon: Lanis Fonda BRAVO, MD;  Location: [facility] INVASIVE CV LAB;  Service: Cardiovascular;  Laterality: Left;  left AVF   REMOVAL OF A DIALYSIS CATHETER N/A 12/03/2021   Procedure: MINOR REMOVAL OF A TUNNELED DIALYSIS CATHETER;  Surgeon: Oris Krystal FALCON, MD;  Location: [facility];  Service: Vascular;  Laterality: N/A;   REMOVAL OF A DIALYSIS CATHETER N/A 11/18/2022   Procedure: MINOR REMOVAL OF A TUNNELED DIALYSIS CATHETER;  Surgeon: Oris Krystal FALCON, MD;  Location: [facility];  Service: Vascular;  Laterality: N/A;   THROMBECTOMY W/ EMBOLECTOMY Right 12/10/2017   Procedure: THROMBECTOMY REVISION RIGHT ARM  ARTERIOVENOUS FISTULA;  Surgeon: Blade Bruckner GORMAN, MD;  Location: [facility] OR;  Service: Vascular;  Laterality: Right;   TRANSURETHRAL RESECTION OF PROSTATE N/A 01/04/2014   Procedure: TRANSURETHRAL RESECTION OF THE PROSTATE (TURP) (procedure #2);  Surgeon: Mohammad I Javaid, MD;  Location: [facility];  Service: Urology;  Laterality: N/A;    Social History   Socioeconomic History   Marital status: Single    Spouse name: Not on file   Number of children: Not on file   Years of education: Not on file   Highest education level: Not on file  Occupational History   Occupation: retired   Tobacco Use   Smoking status: Never   Smokeless tobacco: Never  Vaping Use   Vaping status: Never Used  Substance and Sexual Activity   Alcohol use: No   Drug use: No   Sexual activity: Not Currently  Other Topics Concern   Not on file  Social History Narrative   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   Long term resident of [facility]    Social Drivers of Health   Financial Resource Strain: Low Risk  (02/18/2018)   Overall Financial Resource Strain (CARDIA)    Difficulty of Paying Living Expenses: Not hard at all  Food Insecurity: No Food Insecurity (02/17/2024)   Hunger Vital Sign    Worried About Running Out of Food in the Last Year: Never true    Ran Out of Food in the Last Year:  Never true  Transportation Needs: No Transportation Needs (02/17/2024)   PRAPARE - Administrator, Civil Service (Medical): No    Lack of Transportation (Non-Medical): No  Physical Activity: Unknown (09/15/2019)   Exercise Vital Sign    Days of Exercise per Week: Not on file    Minutes of Exercise per Session: 0 min  Stress: No Stress Concern Present (02/18/2018)   [employer] of Occupational Health - Occupational Stress Questionnaire    Feeling of Stress : Not at all  Social Connections: Socially Isolated (02/17/2024)   Social Connection and Isolation Panel    Frequency of Communication with Friends and  Family: Three times a week    Frequency of Social Gatherings with Friends and Family: Once a week    Attends Religious Services: Never    Database administrator or Organizations: No    Attends Banker Meetings: Never    Marital Status: Never married  Intimate Partner Violence: Not At Risk (02/17/2024)   Humiliation, Afraid, Rape, and Kick questionnaire    Fear of Current or Ex-Partner: No    Emotionally Abused: No    Physically Abused: No    Sexually Abused: No   Family History  Problem Relation Age of Onset   Cancer Mother    Colon cancer Neg Hx       VITAL SIGNS BP 100/61   Pulse 84   Temp 98.5 F (36.9 C)   Resp 20   Ht 5' 9 (1.753 m)   Wt 208 lb (94.3 kg)   SpO2 97%   BMI 30.72 kg/m   Outpatient Encounter Medications as of 08/19/2024  Medication Sig Note   aspirin  81 MG chewable tablet Chew 81 mg by mouth daily.    Continuous Glucose Sensor (FREESTYLE LIBRE 2 PLUS SENSOR) MISC by Does not apply route.    insulin  glargine-yfgn (SEMGLEE , YFGN,) 100 UNIT/ML Pen Inject 8 Units into the skin at bedtime.    levothyroxine  (SYNTHROID ) 150 MCG tablet Take 150 mcg by mouth daily before breakfast.    midodrine  (PROAMATINE ) 10 MG tablet Take 10 mg by mouth See admin instructions. Send medication with resident on Tues/Thurs/Sat to dialysis. Do  not administer at [facility], give to resident to take with him to dialysis. 02/17/2024: Confirmed per [facility] - pt only receives while at dialysis on Tues/Thurs/Sat. Pt receives 2 strengths of midodrine  to equal 15mg  per dose.   midodrine  (PROAMATINE ) 5 MG tablet Take 5 mg by mouth See admin instructions. Send medication with resident on Tues/Thurs/Sat to dialysis. Do not administer at [facility], give to resident to take with him to dialysis.    Nutritional Supplements (,FEEDING SUPPLEMENT, PROSOURCE PLUS) liquid Take 30 mLs by mouth 2 (two) times daily.    Omega-3 Fatty Acids (OMEGA-3 FISH OIL PO) Take 2 capsules by mouth at bedtime. Omega 3 - DHA - EPA - Fish oil 300-1000mg  capsules    rosuvastatin  (CRESTOR ) 20 MG tablet Take 20 mg by mouth at bedtime.    sevelamer  carbonate (RENVELA ) 800 MG tablet Take 3 tablets (2,400 mg total) by mouth 3 (three) times daily with meals.    tamsulosin  (FLOMAX ) 0.4 MG CAPS capsule Take 0.4 mg by mouth at bedtime. Give 30 minutes after a meal. Do not crush or chew    torsemide  (DEMADEX ) 20 MG tablet Take 20 mg by mouth See admin instructions. Give 1 tablet (20mg ) by mouth once a day in the afternoon on Sunday, Monday, Wednesday, Friday.    UNABLE TO FIND Med Name: BD AutoShield Duo pen needle (30g x 3/16 needle)    vitamin B-12 (CYANOCOBALAMIN) 1000 MCG tablet Take 1,000 mcg by mouth at bedtime. (Patient not taking: Reported on 07/05/2024)    No facility-administered encounter medications on file as of 08/19/2024.     SIGNIFICANT DIAGNOSTIC EXAMS   LABS REVIEWED PREVIOUS;       11 -7-24: tsh 3.852 free t4: 0.82 09-14-23: protein 6.9 albumin  3.2; chol 127; ldl 37 trig 420 hdl 25 12-17-23: wbc 3.9; hgb 10.6; hct 30.5; mcv 103.7 plt 110; hgb A1c 6.6; PSA 8.02; chol 118; ldl 45; trig 222; hdl 29; tsh  10.663 02-16-24: wbc 4.3; hgb 9.3; hct 27.9; mcv 104.5 plt 81; glucose 138; bun 80; creat 17.85; k+ 5.8; na++ 134; ca 9.3 gfr 2; protein 7.3 albumin  3.4 02-18-24: wbc  3.9; hgb 8.7; hct 25.5; mcv 102.4 plt 78; glucose 133; bun 52; creat 13.86; k+ 4.7; na++ 137; ca 8.7; gfr 3; protein 6.6 albumin  3.1 mag 2.5 phos 6.9  02-23-24: wbc 4.8; hgb 8.8; hct 26.8; mcv 105.9 plt 140; tsh 2.608; vitamin B 12: 4099 02-26-24:vitamin B12: 2246 03-24-24: hgb A1c 6.0 04-15-24: PSA 4.91   NO NEW LABS.    Review of Systems  Constitutional:  Negative for malaise/fatigue.  Respiratory:  Negative for cough and shortness of breath.   Cardiovascular:  Negative for chest pain, palpitations and leg swelling.  Gastrointestinal:  Negative for abdominal pain, constipation and heartburn.  Musculoskeletal:  Negative for back pain, joint pain and myalgias.  Skin: Negative.   Neurological:  Negative for dizziness.  Psychiatric/Behavioral:  The patient is not nervous/anxious.    Physical Exam Constitutional:      General: He is not in acute distress.    Appearance: He is well-developed. He is not diaphoretic.  Neck:     Thyroid : No thyromegaly.  Cardiovascular:     Rate and Rhythm: Normal rate and regular rhythm.     Heart sounds: Normal heart sounds.  Pulmonary:     Effort: Pulmonary effort is normal. No respiratory distress.     Breath sounds: Normal breath sounds.  Abdominal:     General: Bowel sounds are normal. There is no distension.     Palpations: Abdomen is soft.     Tenderness: There is no abdominal tenderness.  Musculoskeletal:        General: Normal range of motion.     Cervical back: Neck supple.     Right lower leg: No edema.     Left lower leg: No edema.  Lymphadenopathy:     Cervical: No cervical adenopathy.  Skin:    General: Skin is warm and dry.     Comments: Left arm fistula +thrill/bruit   Neurological:     Mental Status: He is alert. Mental status is at baseline.     Comments: SLUMS 2/30  Psychiatric:        Mood and Affect: Mood normal.       ASSESSMENT/ PLAN:  TODAY  Long term insulin  use End stage renal disease on hemodialysis due to  type 2 diabetes Secondary hyperparathyroidism of renal origin.   Will continue current medications Will continue current plan of care Will continue to monitor his status.   Time spent with patient: 40 minutes: medication; dietary; activities.    Barnie Seip NP [facility]  call [phone]

## 2024-08-20 DIAGNOSIS — D509 Iron deficiency anemia, unspecified: Secondary | ICD-10-CM | POA: Diagnosis not present

## 2024-08-20 DIAGNOSIS — N186 End stage renal disease: Secondary | ICD-10-CM | POA: Diagnosis not present

## 2024-08-20 DIAGNOSIS — Z992 Dependence on renal dialysis: Secondary | ICD-10-CM | POA: Diagnosis not present

## 2024-08-20 DIAGNOSIS — N250 Renal osteodystrophy: Secondary | ICD-10-CM | POA: Diagnosis not present

## 2024-08-20 DIAGNOSIS — D631 Anemia in chronic kidney disease: Secondary | ICD-10-CM | POA: Diagnosis not present

## 2024-08-20 DIAGNOSIS — N2581 Secondary hyperparathyroidism of renal origin: Secondary | ICD-10-CM | POA: Diagnosis not present

## 2024-08-23 DIAGNOSIS — D631 Anemia in chronic kidney disease: Secondary | ICD-10-CM | POA: Diagnosis not present

## 2024-08-23 DIAGNOSIS — D509 Iron deficiency anemia, unspecified: Secondary | ICD-10-CM | POA: Diagnosis not present

## 2024-08-23 DIAGNOSIS — Z992 Dependence on renal dialysis: Secondary | ICD-10-CM | POA: Diagnosis not present

## 2024-08-23 DIAGNOSIS — N250 Renal osteodystrophy: Secondary | ICD-10-CM | POA: Diagnosis not present

## 2024-08-23 DIAGNOSIS — N2581 Secondary hyperparathyroidism of renal origin: Secondary | ICD-10-CM | POA: Diagnosis not present

## 2024-08-23 DIAGNOSIS — N186 End stage renal disease: Secondary | ICD-10-CM | POA: Diagnosis not present

## 2024-08-25 DIAGNOSIS — Z992 Dependence on renal dialysis: Secondary | ICD-10-CM | POA: Diagnosis not present

## 2024-08-25 DIAGNOSIS — N186 End stage renal disease: Secondary | ICD-10-CM | POA: Diagnosis not present

## 2024-08-25 DIAGNOSIS — D509 Iron deficiency anemia, unspecified: Secondary | ICD-10-CM | POA: Diagnosis not present

## 2024-08-25 DIAGNOSIS — N2581 Secondary hyperparathyroidism of renal origin: Secondary | ICD-10-CM | POA: Diagnosis not present

## 2024-08-25 DIAGNOSIS — N250 Renal osteodystrophy: Secondary | ICD-10-CM | POA: Diagnosis not present

## 2024-08-25 DIAGNOSIS — D631 Anemia in chronic kidney disease: Secondary | ICD-10-CM | POA: Diagnosis not present

## 2024-08-26 DIAGNOSIS — N186 End stage renal disease: Secondary | ICD-10-CM | POA: Diagnosis not present

## 2024-08-26 DIAGNOSIS — Z992 Dependence on renal dialysis: Secondary | ICD-10-CM | POA: Diagnosis not present

## 2024-08-27 DIAGNOSIS — D509 Iron deficiency anemia, unspecified: Secondary | ICD-10-CM | POA: Diagnosis not present

## 2024-08-27 DIAGNOSIS — Z992 Dependence on renal dialysis: Secondary | ICD-10-CM | POA: Diagnosis not present

## 2024-08-27 DIAGNOSIS — N186 End stage renal disease: Secondary | ICD-10-CM | POA: Diagnosis not present

## 2024-08-27 DIAGNOSIS — N2581 Secondary hyperparathyroidism of renal origin: Secondary | ICD-10-CM | POA: Diagnosis not present

## 2024-08-27 DIAGNOSIS — N250 Renal osteodystrophy: Secondary | ICD-10-CM | POA: Diagnosis not present

## 2024-08-27 DIAGNOSIS — D631 Anemia in chronic kidney disease: Secondary | ICD-10-CM | POA: Diagnosis not present

## 2024-08-29 ENCOUNTER — Encounter: Payer: Self-pay | Admitting: Adult Health

## 2024-08-29 ENCOUNTER — Non-Acute Institutional Stay (SKILLED_NURSING_FACILITY): Payer: Self-pay | Admitting: Adult Health

## 2024-08-29 DIAGNOSIS — Z992 Dependence on renal dialysis: Secondary | ICD-10-CM

## 2024-08-29 DIAGNOSIS — N401 Enlarged prostate with lower urinary tract symptoms: Secondary | ICD-10-CM

## 2024-08-29 DIAGNOSIS — Z794 Long term (current) use of insulin: Secondary | ICD-10-CM

## 2024-08-29 DIAGNOSIS — E1122 Type 2 diabetes mellitus with diabetic chronic kidney disease: Secondary | ICD-10-CM | POA: Diagnosis not present

## 2024-08-29 DIAGNOSIS — I700 Atherosclerosis of aorta: Secondary | ICD-10-CM | POA: Diagnosis not present

## 2024-08-29 DIAGNOSIS — R338 Other retention of urine: Secondary | ICD-10-CM

## 2024-08-29 DIAGNOSIS — N186 End stage renal disease: Secondary | ICD-10-CM

## 2024-08-29 NOTE — Progress Notes (Unsigned)
 Location:  [facility]   Place of Service:   SNF   CODE STATUS: Full Code  No Known Allergies  Chief Complaint  Patient presents with   Medical Management of Chronic Issues    [place]. 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 There are no reports of uncontrolled pain. He continues to with dialysis three days per week. He is due for blood work to be done. Will setup    Past Medical History:  Diagnosis Date   Abnormal CT scan, kidney 10/06/2011   Acute pyelonephritis 10/07/2011   Anemia    normocytic   Anxiety    mental retardation   Bladder wall thickening 10/06/2011   BPH (benign prostatic hypertrophy)    Diabetes mellitus    Dialysis patient    Tuesday, Thursday and Saturday,    DVT of leg (deep venous thrombosis) (HCC) 12/25/2016   Edema     history of lower extremity edema   GERD (gastroesophageal reflux disease)    Heme positive stool    Hydronephrosis    Hyperkalemia    Hyperlipidemia    Hypernatremia    Hypertension    Hypothyroidism    Impaired speech    Infected prosthetic vascular graft    MR (mental retardation)    Muscle weakness    Obstructive uropathy    Perinephric abscess 10/07/2011   Poor historian poor historian   Primary colorectal adenocarcinoma (HCC) 04/2017   S/p total colectomy   Protein calorie malnutrition    Pyelonephritis    Renal failure (ARF), acute on chronic    Renal insufficiency    chronic history   Sepsis (HCC)    Smoking    Uremia    Urinary retention    UTI (lower urinary tract infection) 10/06/2011    Past Surgical History:  Procedure Laterality Date    A/V FISTULAGRAM N/A 08/13/2018   Procedure: A/V FISTULAGRAM - Right Upper;  Surgeon: Harvey Carlin BRAVO, MD;  Location: [facility] INVASIVE CV LAB;  Service: Cardiovascular;  Laterality: N/A;   A/V FISTULAGRAM N/A 11/22/2018   Procedure: A/V FISTULAGRAM - Right Upper;  Surgeon: Sheree Penne Bruckner, MD;  Location: [facility] INVASIVE CV LAB;  Service: Cardiovascular;  Laterality: N/A;   A/V FISTULAGRAM Left 10/08/2022   Procedure: A/V Fistulagram;  Surgeon: Lanis Fonda BRAVO, MD;  Location: [facility] INVASIVE CV LAB;  Service: Cardiovascular;  Laterality: Left;   A/V FISTULAGRAM Left 05/06/2023   Procedure: A/V Fistulagram;  Surgeon: Magda Debby SAILOR, MD;  Location: [facility] INVASIVE CV LAB;  Service: Cardiovascular;  Laterality: Left;   AV FISTULA PLACEMENT Left 07/06/2015   Procedure:  INSERTION LEFT ARM ARTERIOVENOUS GORTEX GRAFT;  Surgeon: Bruckner GORMAN Blade, MD;  Location: [facility] OR;  Service: Vascular;  Laterality: Left;   AV FISTULA PLACEMENT Right 02/26/2016   Procedure: ARTERIOVENOUS (AV) FISTULA CREATION ;  Surgeon: Bruckner GORMAN Blade, MD;  Location: [facility] OR;  Service: Vascular;  Laterality: Right;   AV FISTULA PLACEMENT Right 11/25/2018   Procedure: INSERTION OF ARTERIOVENOUS (AV) ARTEGRAFT RIGHT UPPER ARM;  Surgeon: Sheree Penne Bruckner, MD;  Location: [facility] OR;  Service: Vascular;  Laterality: Right;   AV FISTULA PLACEMENT Left 05/28/2021   Procedure: LEFT ARM ARTERIOVENOUS (AV) FISTULA;  Surgeon: Oris Krystal FALCON, MD;  Location: [facility] ORS;  Service: Vascular;  Laterality: Left;   AVGG REMOVAL Left 10/09/2015   Procedure: REMOVAL OF ARTERIOVENOUS GORETEX GRAFT (AVGG) Evacuation of Lymphocele, Vein Patch angioplasty of brachial artery.;  Surgeon: Lonni GORMAN Blade, MD;  Location: [facility] OR;  Service: Vascular;  Laterality: Left;   BASCILIC VEIN TRANSPOSITION Right 02/26/2016   Procedure: Right BASCILIC VEIN TRANSPOSITION;  Surgeon: Lonni GORMAN Blade, MD;  Location: [facility] OR;  Service: Vascular;  Laterality:  Right;   BASCILIC VEIN TRANSPOSITION Left 07/30/2021   Procedure: LEFT ARM SECOND STAGE BASILIC VEIN TRANSPOSITION;  Surgeon: Oris Krystal FALCON, MD;  Location: [facility] ORS;  Service: Vascular;  Laterality: Left;   CIRCUMCISION N/A 01/04/2014   Procedure: CIRCUMCISION ADULT (procedure #1);  Surgeon: Mohammad I Javaid, MD;  Location: [facility] ORS;  Service: Urology;  Laterality: N/A;   COLECTOMY N/A 05/04/2017   Procedure: TOTAL COLECTOMY;  Surgeon: Mavis Anes, MD;  Location: [facility] ORS;  Service: General;  Laterality: N/A;   COLONOSCOPY N/A 04/27/2017   Surgeon: Shaaron Lamar HERO, MD; annular mass in the ascending colon likely representing cancer biopsied, multiple 6-22 mm polyps removed, clean rectum.  Pathology with multiple tubular adenomas, high-grade dysplasia noted in ascending colon and splenic flexure.   CYSTOSCOPY W/ RETROGRADES Bilateral 06/29/2015   Procedure: CYSTOSCOPY, DILATION OF URETHRAL STRICTURE WITH BILATERAL RETROGRADE PYELOGRAM,SUPRAPUBIC TUBE CHANGE;  Surgeon: Donnice Brooks, MD;  Location: [facility] ORS;  Service: Urology;  Laterality: Bilateral;   CYSTOSCOPY WITH URETHRAL DILATATION N/A 12/29/2013   Procedure: CYSTOSCOPY WITH URETHRAL DILATATION;  Surgeon: Emery LILLETTE Blaze, MD;  Location: [facility] ORS;  Service: Urology;  Laterality: N/A;   ESOPHAGOGASTRODUODENOSCOPY N/A 04/27/2017   Procedure: ESOPHAGOGASTRODUODENOSCOPY (EGD);  Surgeon: Shaaron Lamar HERO, MD;  [facility]: [facility];  Service: Endoscopy;  Laterality: N/A;   FLEXIBLE SIGMOIDOSCOPY N/A 12/02/2021   Procedure: FLEXIBLE SIGMOIDOSCOPY;  Surgeon: Cindie Carlin POUR, DO;  Location: [facility];  Service: Endoscopy;  Laterality: N/A;  10:30am, dialysis patient   INSERTION OF DIALYSIS CATHETER Right 11/25/2018   Procedure: INSERTION OF DIALYSIS CATHETER RIGHT INTERNAL JUGULAR;  Surgeon: Sheree Penne Lonni, MD;  Location: [facility] OR;  Service: Vascular;  Laterality: Right;   IR AV DIALY SHUNT INTRO NEEDLE/INTRAC INITIAL W/PTA/STENT/IMG LT Left  02/16/2024   IR AV DIALY SHUNT INTRO NEEDLE/INTRACATH INITIAL W/PTA/IMG RIGHT Right 09/07/2018   IR FLUORO GUIDE CV LINE RIGHT  10/16/2020   IR FLUORO GUIDE CV LINE RIGHT  10/03/2022   IR REMOVAL TUN CV CATH W/O FL  01/12/2019   IR THROMBECTOMY AV FISTULA W/THROMBOLYSIS/PTA INC/SHUNT/IMG RIGHT Right 04/26/2018   IR US  GUIDE VASC ACCESS LEFT  02/17/2024   IR US  GUIDE VASC ACCESS RIGHT  04/26/2018   IR US  GUIDE VASC ACCESS RIGHT  09/07/2018   IR US  GUIDE VASC ACCESS RIGHT  10/16/2020   IR US  GUIDE VASC ACCESS RIGHT  10/16/2020   IR US  GUIDE VASC ACCESS RIGHT  10/03/2022   ORIF FEMUR FRACTURE Right 11/22/2016   Procedure: OPEN REDUCTION INTERNAL FIXATION (ORIF) DISTAL FEMUR FRACTURE;  Surgeon: Redell Shoals, MD;  Location: [facility] OR;  Service: Orthopedics;  Laterality: Right;   PATCH ANGIOPLASTY Right 12/10/2017   Procedure: PATCH ANGIOPLASTY;  Surgeon: Blade Lonni GORMAN, MD;  Location: [facility] OR;  Service: Vascular;  Laterality: Right;   PERIPHERAL VASCULAR BALLOON ANGIOPLASTY  08/13/2018  Procedure: PERIPHERAL VASCULAR BALLOON ANGIOPLASTY;  Surgeon: Harvey Carlin BRAVO, MD;  Location: [facility] INVASIVE CV LAB;  Service: Cardiovascular;;  right AV fistula    PERIPHERAL VASCULAR BALLOON ANGIOPLASTY  11/22/2018   Procedure: PERIPHERAL VASCULAR BALLOON ANGIOPLASTY;  Surgeon: Sheree Penne Bruckner, MD;  Location: [facility] INVASIVE CV LAB;  Service: Cardiovascular;;  rt AV fistula   PERIPHERAL VASCULAR BALLOON ANGIOPLASTY  10/08/2022   Procedure: PERIPHERAL VASCULAR BALLOON ANGIOPLASTY;  Surgeon: Lanis Fonda BRAVO, MD;  Location: [facility] INVASIVE CV LAB;  Service: Cardiovascular;;  arterial anastamosis   PERIPHERAL VASCULAR CATHETERIZATION N/A 10/08/2015   Procedure: A/V Shuntogram;  Surgeon: Bruckner GORMAN Blade, MD;  Location: [facility] INVASIVE CV LAB;  Service: Cardiovascular;  Laterality: N/A;   PERIPHERAL VASCULAR INTERVENTION Left 10/08/2022   Procedure: PERIPHERAL VASCULAR INTERVENTION;  Surgeon: Lanis Fonda BRAVO,  MD;  Location: [facility] INVASIVE CV LAB;  Service: Cardiovascular;  Laterality: Left;  left AVF   REMOVAL OF A DIALYSIS CATHETER N/A 12/03/2021   Procedure: MINOR REMOVAL OF A TUNNELED DIALYSIS CATHETER;  Surgeon: Oris Krystal FALCON, MD;  Location: [facility];  Service: Vascular;  Laterality: N/A;   REMOVAL OF A DIALYSIS CATHETER N/A 11/18/2022   Procedure: MINOR REMOVAL OF A TUNNELED DIALYSIS CATHETER;  Surgeon: Oris Krystal FALCON, MD;  Location: [facility]S;  Service: Vascular;  Laterality: N/A;   THROMBECTOMY W/ EMBOLECTOMY Right 12/10/2017   Procedure: THROMBECTOMY REVISION RIGHT ARM  ARTERIOVENOUS FISTULA;  Surgeon: Blade Bruckner GORMAN, MD;  Location: [facility] OR;  Service: Vascular;  Laterality: Right;   TRANSURETHRAL RESECTION OF PROSTATE N/A 01/04/2014   Procedure: TRANSURETHRAL RESECTION OF THE PROSTATE (TURP) (procedure #2);  Surgeon: Mohammad I Javaid, MD;  Location: [facility] ORS;  Service: Urology;  Laterality: N/A;    Social History   Socioeconomic History   Marital status: Single    Spouse name: Not on file   Number of children: Not on file   Years of education: Not on file   Highest education level: Not on file  Occupational History   Occupation: retired   Tobacco Use   Smoking status: Never   Smokeless tobacco: Never  Vaping Use   Vaping status: Never Used  Substance and Sexual Activity   Alcohol use: No   Drug use: No   Sexual activity: Not Currently  Other Topics Concern   Not on file  Social History Narrative   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   Long term resident of [facility]    Social Drivers of Health   Financial Resource Strain: Low Risk  (02/18/2018)   Overall Financial Resource Strain (CARDIA)    Difficulty of Paying Living Expenses: Not hard at all  Food Insecurity: No Food Insecurity (02/17/2024)   Hunger Vital Sign    Worried About Running Out of Food in the Last Year: Never true    Ran Out of Food in the Last Year: Never true  Transportation  Needs: No Transportation Needs (02/17/2024)   PRAPARE - Administrator, Civil Service (Medical): No    Lack of Transportation (Non-Medical): No  Physical Activity: Unknown (09/15/2019)   Exercise Vital Sign    Days of Exercise per Week: Not on file    Minutes of Exercise per Session: 0 min  Stress: No Stress Concern Present (02/18/2018)   [employer] of Occupational Health - Occupational Stress Questionnaire    Feeling of Stress : Not at all  Social Connections: Socially Isolated (02/17/2024)   Social Connection and Isolation Panel  Frequency of Communication with Friends and Family: Three times a week    Frequency of Social Gatherings with Friends and Family: Once a week    Attends Religious Services: Never    Database Administrator or Organizations: No    Attends Banker Meetings: Never    Marital Status: Never married  Intimate Partner Violence: Not At Risk (02/17/2024)   Humiliation, Afraid, Rape, and Kick questionnaire    Fear of Current or Ex-Partner: No    Emotionally Abused: No    Physically Abused: No    Sexually Abused: No   Family History  Problem Relation Age of Onset   Cancer Mother    Colon cancer Neg Hx       VITAL SIGNS BP 108/78   Pulse 88   Temp 98.3 F (36.8 C)   Resp 20   Ht 5' 9 (1.753 m)   Wt 208 lb (94.3 kg)   SpO2 100%   BMI 30.72 kg/m   Outpatient Encounter Medications as of 08/29/2024  Medication Sig Note   aspirin  81 MG chewable tablet Chew 81 mg by mouth daily.    insulin  glargine-yfgn (SEMGLEE , YFGN,) 100 UNIT/ML Pen Inject 8 Units into the skin at bedtime.    levothyroxine  (SYNTHROID ) 150 MCG tablet Take 150 mcg by mouth daily before breakfast.    midodrine  (PROAMATINE ) 10 MG tablet Take 10 mg by mouth See admin instructions. Send medication with resident on Tues/Thurs/Sat to dialysis. Do not administer at [facility], give to resident to take with him to dialysis. 02/17/2024: Confirmed per [facility] - pt only  receives while at dialysis on Tues/Thurs/Sat. Pt receives 2 strengths of midodrine  to equal 15mg  per dose.   midodrine  (PROAMATINE ) 5 MG tablet Take 5 mg by mouth See admin instructions. Send medication with resident on Tues/Thurs/Sat to dialysis. Do not administer at [facility], give to resident to take with him to dialysis.    Omega-3 Fatty Acids (OMEGA-3 FISH OIL PO) Take 2 capsules by mouth at bedtime. Omega 3 - DHA - EPA - Fish oil 300-1000mg  capsules    rosuvastatin  (CRESTOR ) 20 MG tablet Take 20 mg by mouth at bedtime.    sevelamer  carbonate (RENVELA ) 800 MG tablet Take 3 tablets (2,400 mg total) by mouth 3 (three) times daily with meals.    tamsulosin  (FLOMAX ) 0.4 MG CAPS capsule Take 0.4 mg by mouth at bedtime. Give 30 minutes after a meal. Do not crush or chew    torsemide  (DEMADEX ) 20 MG tablet Take 20 mg by mouth See admin instructions. Give 1 tablet (20mg ) by mouth once a day in the afternoon on Sunday, Monday, Wednesday, Friday.    UNABLE TO FIND Med Name: BD AutoShield Duo pen needle (30g x 3/16 needle)    Continuous Glucose Sensor (FREESTYLE LIBRE 2 PLUS SENSOR) MISC by Does not apply route. (Patient not taking: Reported on 08/29/2024)    Nutritional Supplements (,FEEDING SUPPLEMENT, PROSOURCE PLUS) liquid Take 30 mLs by mouth 2 (two) times daily.    vitamin B-12 (CYANOCOBALAMIN) 1000 MCG tablet Take 1,000 mcg by mouth at bedtime. (Patient not taking: Reported on 08/29/2024)    No facility-administered encounter medications on file as of 08/29/2024.     SIGNIFICANT DIAGNOSTIC EXAMS  LABS REVIEWED PREVIOUS;       11 -7-24: tsh 3.852 free t4: 0.82 09-14-23: protein 6.9 albumin  3.2; chol 127; ldl 37 trig 420 hdl 25 12-17-23: wbc 3.9; hgb 10.6; hct 30.5; mcv 103.7 plt 110; hgb A1c 6.6;  PSA 8.02; chol 118; ldl 45; trig 222; hdl 29; tsh 10.663 02-16-24: wbc 4.3; hgb 9.3; hct 27.9; mcv 104.5 plt 81; glucose 138; bun 80; creat 17.85; k+ 5.8; na++ 134; ca 9.3 gfr 2; protein 7.3 albumin   3.4 02-18-24: wbc 3.9; hgb 8.7; hct 25.5; mcv 102.4 plt 78; glucose 133; bun 52; creat 13.86; k+ 4.7; na++ 137; ca 8.7; gfr 3; protein 6.6 albumin  3.1 mag 2.5 phos 6.9  02-23-24: wbc 4.8; hgb 8.8; hct 26.8; mcv 105.9 plt 140; tsh 2.608; vitamin B 12: 4099 02-26-24:vitamin B12: 2246 03-24-24: hgb A1c 6.0 04-15-24: PSA 4.91   NO NEW LABS.     Review of Systems  Constitutional:  Negative for malaise/fatigue.  Respiratory:  Negative for cough and shortness of breath.   Cardiovascular:  Negative for chest pain, palpitations and leg swelling.  Gastrointestinal:  Negative for abdominal pain, constipation and heartburn.  Musculoskeletal:  Negative for back pain, joint pain and myalgias.  Skin: Negative.   Neurological:  Negative for dizziness.  Psychiatric/Behavioral:  The patient is not nervous/anxious.     Physical Exam Constitutional:      General: He is not in acute distress.    Appearance: He is well-developed. He is obese. He is not diaphoretic.  Neck:     Thyroid : No thyromegaly.  Cardiovascular:     Rate and Rhythm: Normal rate and regular rhythm.     Heart sounds: Normal heart sounds.  Pulmonary:     Effort: Pulmonary effort is normal. No respiratory distress.     Breath sounds: Normal breath sounds.  Abdominal:     General: Bowel sounds are normal. There is no distension.     Palpations: Abdomen is soft.     Tenderness: There is no abdominal tenderness.  Musculoskeletal:        General: Normal range of motion.     Cervical back: Neck supple.  Lymphadenopathy:     Cervical: No cervical adenopathy.  Skin:    General: Skin is warm and dry.     Comments: Left arm fistula +thrill/bruit    Neurological:     Mental Status: He is alert. Mental status is at baseline.     Comments: SLUMS 2/30   Psychiatric:        Mood and Affect: Mood normal.            ASSESSMENT/ PLAN:  TODAY  Aortic atherosclerosis (Ct 12-30-32) is on asa and statin  2. End stage renal disease on  hemodialysis due to type 2 diabetes mellitus/hemodialysis dependent: on 3 days weekly; is unable to adhere to a fluid restriction: renvela  2400 mg three times daily   3. BPH: will continue flomax  0.4 mg daily    PREVIOUS   4. Neurocognitive deficits: SLUMS 2/30   5. Other specified hypothyroidism: tsh 2.608 is on synthroid  150 mcg daily will monitor   6. Anemia of chronic renal disease on chronic dialysis hgb 8.8; hct 26.8  7. Major depression single episode moderate: is presently off medications.   8. Elevated PSA: is lower at 4.91   9. Osteopenia: t score -2.052 is on supplements  10. GERD without esophagitis: is off PPI  11. History of colon cancer: s/t colectomy last seen by GI on 12-19-21 ct scan done 12-30-21  12. Dyslipidemia associated with type 2 diabetes mellitus: ldl 37; trig 420; will continue crestor  20 mg daily   13. Vitamin B 12 deficiency: is on supplements  14. Secondary hyperparathyroidism of renal origin: will monitor  15. Bilateral lower extremity edema: will continue demedex 20 mg four days weekly  16. Type 2 diabetes mellitus with hypertension and end stage renal disease on hemodialysis: hgb A1c 6.0; will continue basaglar  8 units nightly is on asa and statin  17. Thrombocytopenia: plt 81      Barnie Seip NP [facility]   call [phone]

## 2024-08-30 DIAGNOSIS — N2581 Secondary hyperparathyroidism of renal origin: Secondary | ICD-10-CM | POA: Diagnosis not present

## 2024-08-30 DIAGNOSIS — N250 Renal osteodystrophy: Secondary | ICD-10-CM | POA: Diagnosis not present

## 2024-08-30 DIAGNOSIS — D631 Anemia in chronic kidney disease: Secondary | ICD-10-CM | POA: Diagnosis not present

## 2024-08-30 DIAGNOSIS — Z992 Dependence on renal dialysis: Secondary | ICD-10-CM | POA: Diagnosis not present

## 2024-08-30 DIAGNOSIS — D509 Iron deficiency anemia, unspecified: Secondary | ICD-10-CM | POA: Diagnosis not present

## 2024-08-30 DIAGNOSIS — N186 End stage renal disease: Secondary | ICD-10-CM | POA: Diagnosis not present

## 2024-09-01 ENCOUNTER — Other Ambulatory Visit (HOSPITAL_COMMUNITY)
Admission: RE | Admit: 2024-09-01 | Discharge: 2024-09-01 | Disposition: A | Discharge status: Home / Self Care | Source: Skilled Nursing Facility | Attending: Adult Health | Admitting: Adult Health

## 2024-09-01 DIAGNOSIS — E1129 Type 2 diabetes mellitus with other diabetic kidney complication: Secondary | ICD-10-CM | POA: Insufficient documentation

## 2024-09-01 DIAGNOSIS — Z992 Dependence on renal dialysis: Secondary | ICD-10-CM | POA: Diagnosis not present

## 2024-09-01 DIAGNOSIS — D509 Iron deficiency anemia, unspecified: Secondary | ICD-10-CM | POA: Diagnosis not present

## 2024-09-01 DIAGNOSIS — N250 Renal osteodystrophy: Secondary | ICD-10-CM | POA: Diagnosis not present

## 2024-09-01 DIAGNOSIS — D631 Anemia in chronic kidney disease: Secondary | ICD-10-CM | POA: Diagnosis not present

## 2024-09-01 DIAGNOSIS — N2581 Secondary hyperparathyroidism of renal origin: Secondary | ICD-10-CM | POA: Diagnosis not present

## 2024-09-01 DIAGNOSIS — N186 End stage renal disease: Secondary | ICD-10-CM | POA: Diagnosis not present

## 2024-09-01 LAB — COMPREHENSIVE METABOLIC PANEL WITH GFR
ALT: 25 U/L (ref 0–44)
AST: 20 U/L (ref 15–41)
Albumin: 3.8 g/dL (ref 3.5–5.0)
Alkaline Phosphatase: 93 U/L (ref 38–126)
Anion gap: 14 (ref 5–15)
BUN: 37 mg/dL — ABNORMAL HIGH (ref 8–23)
CO2: 24 mmol/L (ref 22–32)
Calcium: 8.7 mg/dL — ABNORMAL LOW (ref 8.9–10.3)
Chloride: 99 mmol/L (ref 98–111)
Creatinine, Ser: 9.39 mg/dL — ABNORMAL HIGH (ref 0.61–1.24)
GFR, Estimated: 5 mL/min — ABNORMAL LOW (ref >60)
Glucose, Bld: 191 mg/dL — ABNORMAL HIGH (ref 70–99)
Potassium: 4 mmol/L (ref 3.5–5.1)
Sodium: 138 mmol/L (ref 135–145)
Total Bilirubin: 0.5 mg/dL (ref 0.0–1.2)
Total Protein: 7 g/dL (ref 6.5–8.1)

## 2024-09-01 LAB — CBC
HCT: 32.7 % — ABNORMAL LOW (ref 39.0–52.0)
Hemoglobin: 11 g/dL — ABNORMAL LOW (ref 13.0–17.0)
MCH: 34.9 pg — ABNORMAL HIGH (ref 26.0–34.0)
MCHC: 33.6 g/dL (ref 30.0–36.0)
MCV: 103.8 fL — ABNORMAL HIGH (ref 80.0–100.0)
Platelets: 110 K/uL — ABNORMAL LOW (ref 150–400)
RBC: 3.15 MIL/uL — ABNORMAL LOW (ref 4.22–5.81)
RDW: 13.7 % (ref 11.5–15.5)
WBC: 4.7 K/uL (ref 4.0–10.5)
nRBC: 0 % (ref 0.0–0.2)

## 2024-09-01 LAB — HEMOGLOBIN A1C
Hgb A1c MFr Bld: 7.7 % — ABNORMAL HIGH (ref 4.8–5.6)
Mean Plasma Glucose: 174.29 mg/dL

## 2024-09-01 LAB — TSH: TSH: 4.19 u[IU]/mL (ref 0.350–4.500)

## 2024-09-02 ENCOUNTER — Non-Acute Institutional Stay (SKILLED_NURSING_FACILITY): Payer: Self-pay | Admitting: Adult Health

## 2024-09-02 ENCOUNTER — Encounter: Payer: Self-pay | Admitting: Adult Health

## 2024-09-02 DIAGNOSIS — N186 End stage renal disease: Secondary | ICD-10-CM

## 2024-09-02 DIAGNOSIS — Z794 Long term (current) use of insulin: Secondary | ICD-10-CM | POA: Diagnosis not present

## 2024-09-02 DIAGNOSIS — I120 Hypertensive chronic kidney disease with stage 5 chronic kidney disease or end stage renal disease: Secondary | ICD-10-CM | POA: Diagnosis not present

## 2024-09-02 DIAGNOSIS — Z992 Dependence on renal dialysis: Secondary | ICD-10-CM | POA: Diagnosis not present

## 2024-09-02 DIAGNOSIS — E1122 Type 2 diabetes mellitus with diabetic chronic kidney disease: Secondary | ICD-10-CM | POA: Diagnosis not present

## 2024-09-02 NOTE — Progress Notes (Signed)
 Location:  [facility] Room Number: 18 D Place of Service:  SNF (31)   CODE STATUS: Full Code  No Known Allergies  Chief Complaint  Patient presents with   Acute Visit    Diabetes    HPI:  His hgb A1c is 7.7 with a previous of 6.0 she is taking glargine 8 units nightly. Her cbg readings have been elevated in the upper 100's through 200's. There are no reports of excessive hunger or thirst.   Past Medical History:  Diagnosis Date   Abnormal CT scan, kidney 10/06/2011   Acute pyelonephritis 10/07/2011   Anemia    normocytic   Anxiety    mental retardation   Bladder wall thickening 10/06/2011   BPH (benign prostatic hypertrophy)    Diabetes mellitus    Dialysis patient    Tuesday, Thursday and Saturday,    DVT of leg (deep venous thrombosis) (HCC) 12/25/2016   Edema     history of lower extremity edema   GERD (gastroesophageal reflux disease)    Heme positive stool    Hydronephrosis    Hyperkalemia    Hyperlipidemia    Hypernatremia    Hypertension    Hypothyroidism    Impaired speech    Infected prosthetic vascular graft    MR (mental retardation)    Muscle weakness    Obstructive uropathy    Perinephric abscess 10/07/2011   Poor historian poor historian   Primary colorectal adenocarcinoma (HCC) 04/2017   S/p total colectomy   Protein calorie malnutrition    Pyelonephritis    Renal failure (ARF), acute on chronic    Renal insufficiency    chronic history   Sepsis (HCC)    Smoking    Uremia    Urinary retention    UTI (lower urinary tract infection) 10/06/2011    Past Surgical History:  Procedure Laterality Date   A/V FISTULAGRAM N/A 08/13/2018   Procedure: A/V FISTULAGRAM - Right Upper;  Surgeon: Harvey Carlin BRAVO, MD;  Location: [facility] INVASIVE CV LAB;  Service: Cardiovascular;  Laterality: N/A;   A/V FISTULAGRAM N/A 11/22/2018   Procedure: A/V FISTULAGRAM - Right Upper;  Surgeon: Sheree Penne Bruckner, MD;  Location: [facility]  INVASIVE CV LAB;  Service: Cardiovascular;  Laterality: N/A;   A/V FISTULAGRAM Left 10/08/2022   Procedure: A/V Fistulagram;  Surgeon: Lanis Fonda BRAVO, MD;  Location: [facility] INVASIVE CV LAB;  Service: Cardiovascular;  Laterality: Left;   A/V FISTULAGRAM Left 05/06/2023   Procedure: A/V Fistulagram;  Surgeon: Magda Debby SAILOR, MD;  Location: [facility] INVASIVE CV LAB;  Service: Cardiovascular;  Laterality: Left;   AV FISTULA PLACEMENT Left 07/06/2015   Procedure:  INSERTION LEFT ARM ARTERIOVENOUS GORTEX GRAFT;  Surgeon: Bruckner GORMAN Blade, MD;  Location: [facility] OR;  Service: Vascular;  Laterality: Left;   AV FISTULA PLACEMENT Right 02/26/2016   Procedure: ARTERIOVENOUS (AV) FISTULA CREATION ;  Surgeon: Bruckner GORMAN Blade, MD;  Location: [facility] OR;  Service: Vascular;  Laterality: Right;   AV FISTULA PLACEMENT Right 11/25/2018   Procedure: INSERTION OF ARTERIOVENOUS (AV) ARTEGRAFT RIGHT UPPER ARM;  Surgeon: Sheree Penne Bruckner, MD;  Location: [facility] OR;  Service: Vascular;  Laterality: Right;   AV FISTULA PLACEMENT Left 05/28/2021   Procedure: LEFT ARM ARTERIOVENOUS (AV) FISTULA;  Surgeon: Oris Krystal FALCON, MD;  Location: [facility] ORS;  Service: Vascular;  Laterality: Left;   AVGG REMOVAL Left 10/09/2015   Procedure: REMOVAL OF ARTERIOVENOUS GORETEX GRAFT (AVGG) Evacuation of Lymphocele, Vein Patch angioplasty of brachial artery.;  Surgeon: Lonni GORMAN Blade, MD;  Location: [facility] OR;  Service: Vascular;  Laterality: Left;   BASCILIC VEIN TRANSPOSITION Right 02/26/2016   Procedure: Right BASCILIC VEIN TRANSPOSITION;  Surgeon: Lonni GORMAN Blade, MD;  Location: [facility] OR;  Service: Vascular;  Laterality: Right;   BASCILIC VEIN TRANSPOSITION Left 07/30/2021   Procedure: LEFT ARM SECOND STAGE BASILIC VEIN TRANSPOSITION;  Surgeon: Oris Krystal FALCON, MD;  Location: [facility] ORS;  Service: Vascular;  Laterality: Left;   CIRCUMCISION N/A 01/04/2014   Procedure: CIRCUMCISION ADULT (procedure #1);  Surgeon: Mohammad I Javaid, MD;   Location: [facility] ORS;  Service: Urology;  Laterality: N/A;   COLECTOMY N/A 05/04/2017   Procedure: TOTAL COLECTOMY;  Surgeon: Mavis Anes, MD;  Location: [facility] ORS;  Service: General;  Laterality: N/A;   COLONOSCOPY N/A 04/27/2017   Surgeon: Shaaron Lamar HERO, MD; annular mass in the ascending colon likely representing cancer biopsied, multiple 6-22 mm polyps removed, clean rectum.  Pathology with multiple tubular adenomas, high-grade dysplasia noted in ascending colon and splenic flexure.   CYSTOSCOPY W/ RETROGRADES Bilateral 06/29/2015   Procedure: CYSTOSCOPY, DILATION OF URETHRAL STRICTURE WITH BILATERAL RETROGRADE PYELOGRAM,SUPRAPUBIC TUBE CHANGE;  Surgeon: Donnice Brooks, MD;  Location: [facility] ORS;  Service: Urology;  Laterality: Bilateral;   CYSTOSCOPY WITH URETHRAL DILATATION N/A 12/29/2013   Procedure: CYSTOSCOPY WITH URETHRAL DILATATION;  Surgeon: Emery LILLETTE Blaze, MD;  Location: [facility] ORS;  Service: Urology;  Laterality: N/A;   ESOPHAGOGASTRODUODENOSCOPY N/A 04/27/2017   Procedure: ESOPHAGOGASTRODUODENOSCOPY (EGD);  Surgeon: Shaaron Lamar HERO, MD;  Location: [facility];  Service: Endoscopy;  Laterality: N/A;   FLEXIBLE SIGMOIDOSCOPY N/A 12/02/2021   Procedure: FLEXIBLE SIGMOIDOSCOPY;  Surgeon: Cindie Carlin POUR, DO;  Location: [facility];  Service: Endoscopy;  Laterality: N/A;  10:30am, dialysis patient   INSERTION OF DIALYSIS CATHETER Right 11/25/2018   Procedure: INSERTION OF DIALYSIS CATHETER RIGHT INTERNAL JUGULAR;  Surgeon: Sheree Penne Lonni, MD;  Location: [facility] OR;  Service: Vascular;  Laterality: Right;   IR AV DIALY SHUNT INTRO NEEDLE/INTRAC INITIAL W/PTA/STENT/IMG LT Left 02/16/2024   IR AV DIALY SHUNT INTRO NEEDLE/INTRACATH INITIAL W/PTA/IMG RIGHT Right 09/07/2018   IR FLUORO GUIDE CV LINE RIGHT  10/16/2020   IR FLUORO GUIDE CV LINE RIGHT  10/03/2022   IR REMOVAL TUN CV CATH W/O FL  01/12/2019   IR THROMBECTOMY AV FISTULA W/THROMBOLYSIS/PTA INC/SHUNT/IMG RIGHT Right 04/26/2018    IR US  GUIDE VASC ACCESS LEFT  02/17/2024   IR US  GUIDE VASC ACCESS RIGHT  04/26/2018   IR US  GUIDE VASC ACCESS RIGHT  09/07/2018   IR US  GUIDE VASC ACCESS RIGHT  10/16/2020   IR US  GUIDE VASC ACCESS RIGHT  10/16/2020   IR US  GUIDE VASC ACCESS RIGHT  10/03/2022   ORIF FEMUR FRACTURE Right 11/22/2016   Procedure: OPEN REDUCTION INTERNAL FIXATION (ORIF) DISTAL FEMUR FRACTURE;  Surgeon: Redell Shoals, MD;  Location: [facility] OR;  Service: Orthopedics;  Laterality: Right;   PATCH ANGIOPLASTY Right 12/10/2017   Procedure: PATCH ANGIOPLASTY;  Surgeon: Blade Lonni GORMAN, MD;  Location: [facility] OR;  Service: Vascular;  Laterality: Right;   PERIPHERAL VASCULAR BALLOON ANGIOPLASTY  08/13/2018   Procedure: PERIPHERAL VASCULAR BALLOON ANGIOPLASTY;  Surgeon: Harvey Carlin BRAVO, MD;  Location: [facility] INVASIVE CV LAB;  Service: Cardiovascular;;  right AV fistula    PERIPHERAL VASCULAR BALLOON ANGIOPLASTY  11/22/2018   Procedure: PERIPHERAL VASCULAR BALLOON ANGIOPLASTY;  Surgeon: Sheree Penne Lonni, MD;  Location: [facility] INVASIVE CV LAB;  Service: Cardiovascular;;  rt AV fistula   PERIPHERAL VASCULAR BALLOON ANGIOPLASTY  10/08/2022   Procedure: PERIPHERAL VASCULAR BALLOON ANGIOPLASTY;  Surgeon: Lanis Fonda BRAVO, MD;  Location: [facility] INVASIVE CV LAB;  Service: Cardiovascular;;  arterial anastamosis   PERIPHERAL VASCULAR CATHETERIZATION N/A 10/08/2015   Procedure: A/V Shuntogram;  Surgeon: Lonni GORMAN Blade, MD;  Location: [facility] INVASIVE CV LAB;  Service: Cardiovascular;  Laterality: N/A;   PERIPHERAL VASCULAR INTERVENTION Left 10/08/2022   Procedure: PERIPHERAL VASCULAR INTERVENTION;  Surgeon: Lanis Fonda BRAVO, MD;  Location: [facility] INVASIVE CV LAB;  Service: Cardiovascular;  Laterality: Left;  left AVF   REMOVAL OF A DIALYSIS CATHETER N/A 12/03/2021   Procedure: MINOR REMOVAL OF A TUNNELED DIALYSIS CATHETER;  Surgeon: Oris Krystal FALCON, MD;  Location: [facility]S;  Service: Vascular;  Laterality: N/A;   REMOVAL OF A DIALYSIS CATHETER  N/A 11/18/2022   Procedure: MINOR REMOVAL OF A TUNNELED DIALYSIS CATHETER;  Surgeon: Oris Krystal FALCON, MD;  Location: [facility] ORS;  Service: Vascular;  Laterality: N/A;   THROMBECTOMY W/ EMBOLECTOMY Right 12/10/2017   Procedure: THROMBECTOMY REVISION RIGHT ARM  ARTERIOVENOUS FISTULA;  Surgeon: Blade Lonni GORMAN, MD;  Location: [facility] OR;  Service: Vascular;  Laterality: Right;   TRANSURETHRAL RESECTION OF PROSTATE N/A 01/04/2014   Procedure: TRANSURETHRAL RESECTION OF THE PROSTATE (TURP) (procedure #2);  Surgeon: Mohammad I Javaid, MD;  Location: [facility] ORS;  Service: Urology;  Laterality: N/A;    Social History   Socioeconomic History   Marital status: Single    Spouse name: Not on file   Number of children: Not on file   Years of education: Not on file   Highest education level: Not on file  Occupational History   Occupation: retired   Tobacco Use   Smoking status: Never   Smokeless tobacco: Never  Vaping Use   Vaping status: Never Used  Substance and Sexual Activity   Alcohol use: No   Drug use: No   Sexual activity: Not Currently  Other Topics Concern   Not on file  Social History Narrative   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   Long term resident of [facility]    Social Drivers of Health   Financial Resource Strain: Low Risk  (02/18/2018)   Overall Financial Resource Strain (CARDIA)    Difficulty of Paying Living Expenses: Not hard at all  Food Insecurity: No Food Insecurity (02/17/2024)   Hunger Vital Sign    Worried About Running Out of Food in the Last Year: Never true    Ran Out of Food in the Last Year: Never true  Transportation Needs: No Transportation Needs (02/17/2024)   PRAPARE - Administrator, Civil Service (Medical): No    Lack of Transportation (Non-Medical): No  Physical Activity: Unknown (09/15/2019)   Exercise Vital Sign    Days of Exercise per Week: Not on file    Minutes of Exercise per Session: 0 min  Stress: No  Stress Concern Present (02/18/2018)   [employer] of Occupational Health - Occupational Stress Questionnaire    Feeling of Stress : Not at all  Social Connections: Socially Isolated (02/17/2024)   Social Connection and Isolation Panel    Frequency of Communication with Friends and Family: Three times a week    Frequency of Social Gatherings with Friends and Family: Once a week    Attends Religious Services: Never    Database Administrator or Organizations: No    Attends Banker Meetings: Never    Marital Status: Never married  Catering Manager Violence: Not At Risk (  02/17/2024)   Humiliation, Afraid, Rape, and Kick questionnaire    Fear of Current or Ex-Partner: No    Emotionally Abused: No    Physically Abused: No    Sexually Abused: No   Family History  Problem Relation Age of Onset   Cancer Mother    Colon cancer Neg Hx       VITAL SIGNS BP 108/78   Pulse 88   Temp 98.3 F (36.8 C)   Resp 20   Ht 5' 9 (1.753 m)   Wt 211 lb 12.8 oz (96.1 kg)   SpO2 100%   BMI 31.28 kg/m   Outpatient Encounter Medications as of 09/02/2024  Medication Sig Note   aspirin  81 MG chewable tablet Chew 81 mg by mouth daily.    insulin  glargine-yfgn (SEMGLEE , YFGN,) 100 UNIT/ML Pen Inject 8 Units into the skin at bedtime.    levothyroxine  (SYNTHROID ) 150 MCG tablet Take 150 mcg by mouth daily before breakfast.    midodrine  (PROAMATINE ) 10 MG tablet Take 10 mg by mouth See admin instructions. Send medication with resident on Tues/Thurs/Sat to dialysis. Do not administer at [facility], give to resident to take with him to dialysis. 02/17/2024: Confirmed per [facility] - pt only receives while at dialysis on Tues/Thurs/Sat. Pt receives 2 strengths of midodrine  to equal 15mg  per dose.   midodrine  (PROAMATINE ) 5 MG tablet Take 5 mg by mouth See admin instructions. Send medication with resident on Tues/Thurs/Sat to dialysis. Do not administer at [facility], give to resident to take with him  to dialysis.    Nutritional Supplements (,FEEDING SUPPLEMENT, PROSOURCE PLUS) liquid Take 30 mLs by mouth 2 (two) times daily.    Omega-3 Fatty Acids (OMEGA-3 FISH OIL PO) Take 2 capsules by mouth at bedtime. Omega 3 - DHA - EPA - Fish oil 300-1000mg  capsules    rosuvastatin  (CRESTOR ) 20 MG tablet Take 20 mg by mouth at bedtime.    sevelamer  carbonate (RENVELA ) 800 MG tablet Take 3 tablets (2,400 mg total) by mouth 3 (three) times daily with meals.    tamsulosin  (FLOMAX ) 0.4 MG CAPS capsule Take 0.4 mg by mouth at bedtime. Give 30 minutes after a meal. Do not crush or chew    torsemide  (DEMADEX ) 20 MG tablet Take 20 mg by mouth See admin instructions. Give 1 tablet (20mg ) by mouth once a day in the afternoon on Sunday, Monday, Wednesday, Friday.    UNABLE TO FIND Med Name: BD AutoShield Duo pen needle (30g x 3/16 needle)    No facility-administered encounter medications on file as of 09/02/2024.     SIGNIFICANT DIAGNOSTIC EXAMS   LABS REVIEWED PREVIOUS;       11 -7-24: tsh 3.852 free t4: 0.82 09-14-23: protein 6.9 albumin  3.2; chol 127; ldl 37 trig 420 hdl 25 12-17-23: wbc 3.9; hgb 10.6; hct 30.5; mcv 103.7 plt 110; hgb A1c 6.6; PSA 8.02; chol 118; ldl 45; trig 222; hdl 29; tsh 10.663 02-16-24: wbc 4.3; hgb 9.3; hct 27.9; mcv 104.5 plt 81; glucose 138; bun 80; creat 17.85; k+ 5.8; na++ 134; ca 9.3 gfr 2; protein 7.3 albumin  3.4 02-18-24: wbc 3.9; hgb 8.7; hct 25.5; mcv 102.4 plt 78; glucose 133; bun 52; creat 13.86; k+ 4.7; na++ 137; ca 8.7; gfr 3; protein 6.6 albumin  3.1 mag 2.5 phos 6.9  02-23-24: wbc 4.8; hgb 8.8; hct 26.8; mcv 105.9 plt 140; tsh 2.608; vitamin B 12: 4099 02-26-24:vitamin B12: 2246 03-24-24: hgb A1c 6.0 04-15-24: PSA 4.91   TODAY  09-02-24:  hgb A1c 7.7   Review of Systems  Constitutional:  Negative for malaise/fatigue.  Respiratory:  Negative for cough and shortness of breath.   Cardiovascular:  Negative for chest pain, palpitations and leg swelling.  Gastrointestinal:   Negative for abdominal pain, constipation and heartburn.  Musculoskeletal:  Negative for back pain, joint pain and myalgias.  Skin: Negative.   Neurological:  Negative for dizziness.  Psychiatric/Behavioral:  The patient is not nervous/anxious.     Physical Exam Constitutional:      General: He is not in acute distress.    Appearance: He is well-developed. He is not diaphoretic.  Neck:     Thyroid : No thyromegaly.  Cardiovascular:     Rate and Rhythm: Normal rate and regular rhythm.     Heart sounds: Normal heart sounds.  Pulmonary:     Effort: Pulmonary effort is normal. No respiratory distress.     Breath sounds: Normal breath sounds.  Abdominal:     General: Bowel sounds are normal. There is no distension.     Palpations: Abdomen is soft.     Tenderness: There is no abdominal tenderness.  Musculoskeletal:        General: Normal range of motion.     Cervical back: Neck supple.     Right lower leg: No edema.     Left lower leg: No edema.  Lymphadenopathy:     Cervical: No cervical adenopathy.  Skin:    General: Skin is warm and dry.     Comments: Left arm fistula +thrill/bruit     Neurological:     Mental Status: He is alert. Mental status is at baseline.     Comments: SLUMS 2/30    Psychiatric:        Mood and Affect: Mood normal.     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

## 2024-09-03 DIAGNOSIS — Z992 Dependence on renal dialysis: Secondary | ICD-10-CM | POA: Diagnosis not present

## 2024-09-03 DIAGNOSIS — N250 Renal osteodystrophy: Secondary | ICD-10-CM | POA: Diagnosis not present

## 2024-09-03 DIAGNOSIS — N2581 Secondary hyperparathyroidism of renal origin: Secondary | ICD-10-CM | POA: Diagnosis not present

## 2024-09-03 DIAGNOSIS — N186 End stage renal disease: Secondary | ICD-10-CM | POA: Diagnosis not present

## 2024-09-03 DIAGNOSIS — D631 Anemia in chronic kidney disease: Secondary | ICD-10-CM | POA: Diagnosis not present

## 2024-09-03 DIAGNOSIS — D509 Iron deficiency anemia, unspecified: Secondary | ICD-10-CM | POA: Diagnosis not present

## 2024-09-06 DIAGNOSIS — N250 Renal osteodystrophy: Secondary | ICD-10-CM | POA: Diagnosis not present

## 2024-09-06 DIAGNOSIS — N186 End stage renal disease: Secondary | ICD-10-CM | POA: Diagnosis not present

## 2024-09-06 DIAGNOSIS — D509 Iron deficiency anemia, unspecified: Secondary | ICD-10-CM | POA: Diagnosis not present

## 2024-09-06 DIAGNOSIS — N2581 Secondary hyperparathyroidism of renal origin: Secondary | ICD-10-CM | POA: Diagnosis not present

## 2024-09-06 DIAGNOSIS — Z992 Dependence on renal dialysis: Secondary | ICD-10-CM | POA: Diagnosis not present

## 2024-09-06 DIAGNOSIS — D631 Anemia in chronic kidney disease: Secondary | ICD-10-CM | POA: Diagnosis not present

## 2024-09-08 DIAGNOSIS — N186 End stage renal disease: Secondary | ICD-10-CM | POA: Diagnosis not present

## 2024-09-08 DIAGNOSIS — Z992 Dependence on renal dialysis: Secondary | ICD-10-CM | POA: Diagnosis not present

## 2024-09-08 DIAGNOSIS — D509 Iron deficiency anemia, unspecified: Secondary | ICD-10-CM | POA: Diagnosis not present

## 2024-09-08 DIAGNOSIS — N2581 Secondary hyperparathyroidism of renal origin: Secondary | ICD-10-CM | POA: Diagnosis not present

## 2024-09-08 DIAGNOSIS — N250 Renal osteodystrophy: Secondary | ICD-10-CM | POA: Diagnosis not present

## 2024-09-08 DIAGNOSIS — D631 Anemia in chronic kidney disease: Secondary | ICD-10-CM | POA: Diagnosis not present

## 2024-09-10 DIAGNOSIS — N250 Renal osteodystrophy: Secondary | ICD-10-CM | POA: Diagnosis not present

## 2024-09-10 DIAGNOSIS — N186 End stage renal disease: Secondary | ICD-10-CM | POA: Diagnosis not present

## 2024-09-10 DIAGNOSIS — Z992 Dependence on renal dialysis: Secondary | ICD-10-CM | POA: Diagnosis not present

## 2024-09-10 DIAGNOSIS — N2581 Secondary hyperparathyroidism of renal origin: Secondary | ICD-10-CM | POA: Diagnosis not present

## 2024-09-10 DIAGNOSIS — D631 Anemia in chronic kidney disease: Secondary | ICD-10-CM | POA: Diagnosis not present

## 2024-09-10 DIAGNOSIS — D509 Iron deficiency anemia, unspecified: Secondary | ICD-10-CM | POA: Diagnosis not present

## 2024-09-13 DIAGNOSIS — F4322 Adjustment disorder with anxiety: Secondary | ICD-10-CM | POA: Diagnosis not present

## 2024-09-13 DIAGNOSIS — N250 Renal osteodystrophy: Secondary | ICD-10-CM | POA: Diagnosis not present

## 2024-09-13 DIAGNOSIS — N2581 Secondary hyperparathyroidism of renal origin: Secondary | ICD-10-CM | POA: Diagnosis not present

## 2024-09-13 DIAGNOSIS — D509 Iron deficiency anemia, unspecified: Secondary | ICD-10-CM | POA: Diagnosis not present

## 2024-09-13 DIAGNOSIS — N186 End stage renal disease: Secondary | ICD-10-CM | POA: Diagnosis not present

## 2024-09-13 DIAGNOSIS — D631 Anemia in chronic kidney disease: Secondary | ICD-10-CM | POA: Diagnosis not present

## 2024-09-13 DIAGNOSIS — Z992 Dependence on renal dialysis: Secondary | ICD-10-CM | POA: Diagnosis not present

## 2024-09-13 DIAGNOSIS — F70 Mild intellectual disabilities: Secondary | ICD-10-CM | POA: Diagnosis not present

## 2024-09-15 DIAGNOSIS — D631 Anemia in chronic kidney disease: Secondary | ICD-10-CM | POA: Diagnosis not present

## 2024-09-15 DIAGNOSIS — N250 Renal osteodystrophy: Secondary | ICD-10-CM | POA: Diagnosis not present

## 2024-09-15 DIAGNOSIS — N2581 Secondary hyperparathyroidism of renal origin: Secondary | ICD-10-CM | POA: Diagnosis not present

## 2024-09-15 DIAGNOSIS — Z992 Dependence on renal dialysis: Secondary | ICD-10-CM | POA: Diagnosis not present

## 2024-09-15 DIAGNOSIS — D509 Iron deficiency anemia, unspecified: Secondary | ICD-10-CM | POA: Diagnosis not present

## 2024-09-15 DIAGNOSIS — N186 End stage renal disease: Secondary | ICD-10-CM | POA: Diagnosis not present

## 2024-09-17 DIAGNOSIS — D631 Anemia in chronic kidney disease: Secondary | ICD-10-CM | POA: Diagnosis not present

## 2024-09-17 DIAGNOSIS — N186 End stage renal disease: Secondary | ICD-10-CM | POA: Diagnosis not present

## 2024-09-17 DIAGNOSIS — N250 Renal osteodystrophy: Secondary | ICD-10-CM | POA: Diagnosis not present

## 2024-09-17 DIAGNOSIS — Z992 Dependence on renal dialysis: Secondary | ICD-10-CM | POA: Diagnosis not present

## 2024-09-17 DIAGNOSIS — N2581 Secondary hyperparathyroidism of renal origin: Secondary | ICD-10-CM | POA: Diagnosis not present

## 2024-09-17 DIAGNOSIS — D509 Iron deficiency anemia, unspecified: Secondary | ICD-10-CM | POA: Diagnosis not present

## 2024-09-20 DIAGNOSIS — N2581 Secondary hyperparathyroidism of renal origin: Secondary | ICD-10-CM | POA: Diagnosis not present

## 2024-09-20 DIAGNOSIS — N250 Renal osteodystrophy: Secondary | ICD-10-CM | POA: Diagnosis not present

## 2024-09-20 DIAGNOSIS — N186 End stage renal disease: Secondary | ICD-10-CM | POA: Diagnosis not present

## 2024-09-20 DIAGNOSIS — Z992 Dependence on renal dialysis: Secondary | ICD-10-CM | POA: Diagnosis not present

## 2024-09-20 DIAGNOSIS — D509 Iron deficiency anemia, unspecified: Secondary | ICD-10-CM | POA: Diagnosis not present

## 2024-09-20 DIAGNOSIS — D631 Anemia in chronic kidney disease: Secondary | ICD-10-CM | POA: Diagnosis not present

## 2024-09-21 DIAGNOSIS — Z992 Dependence on renal dialysis: Secondary | ICD-10-CM | POA: Diagnosis not present

## 2024-09-21 DIAGNOSIS — D631 Anemia in chronic kidney disease: Secondary | ICD-10-CM | POA: Diagnosis not present

## 2024-09-21 DIAGNOSIS — N186 End stage renal disease: Secondary | ICD-10-CM | POA: Diagnosis not present

## 2024-09-21 DIAGNOSIS — N250 Renal osteodystrophy: Secondary | ICD-10-CM | POA: Diagnosis not present

## 2024-09-21 DIAGNOSIS — N2581 Secondary hyperparathyroidism of renal origin: Secondary | ICD-10-CM | POA: Diagnosis not present

## 2024-09-21 DIAGNOSIS — D509 Iron deficiency anemia, unspecified: Secondary | ICD-10-CM | POA: Diagnosis not present

## 2024-09-22 DIAGNOSIS — N2581 Secondary hyperparathyroidism of renal origin: Secondary | ICD-10-CM | POA: Diagnosis not present

## 2024-09-22 DIAGNOSIS — Z992 Dependence on renal dialysis: Secondary | ICD-10-CM | POA: Diagnosis not present

## 2024-09-22 DIAGNOSIS — D631 Anemia in chronic kidney disease: Secondary | ICD-10-CM | POA: Diagnosis not present

## 2024-09-22 DIAGNOSIS — N186 End stage renal disease: Secondary | ICD-10-CM | POA: Diagnosis not present

## 2024-09-22 DIAGNOSIS — D509 Iron deficiency anemia, unspecified: Secondary | ICD-10-CM | POA: Diagnosis not present

## 2024-09-22 DIAGNOSIS — N250 Renal osteodystrophy: Secondary | ICD-10-CM | POA: Diagnosis not present

## 2024-09-24 DIAGNOSIS — D631 Anemia in chronic kidney disease: Secondary | ICD-10-CM | POA: Diagnosis not present

## 2024-09-24 DIAGNOSIS — N250 Renal osteodystrophy: Secondary | ICD-10-CM | POA: Diagnosis not present

## 2024-09-24 DIAGNOSIS — N186 End stage renal disease: Secondary | ICD-10-CM | POA: Diagnosis not present

## 2024-09-24 DIAGNOSIS — N2581 Secondary hyperparathyroidism of renal origin: Secondary | ICD-10-CM | POA: Diagnosis not present

## 2024-09-24 DIAGNOSIS — D509 Iron deficiency anemia, unspecified: Secondary | ICD-10-CM | POA: Diagnosis not present

## 2024-09-24 DIAGNOSIS — Z992 Dependence on renal dialysis: Secondary | ICD-10-CM | POA: Diagnosis not present

## 2024-09-25 DIAGNOSIS — N186 End stage renal disease: Secondary | ICD-10-CM | POA: Diagnosis not present

## 2024-09-25 DIAGNOSIS — Z992 Dependence on renal dialysis: Secondary | ICD-10-CM | POA: Diagnosis not present

## 2024-09-29 ENCOUNTER — Non-Acute Institutional Stay (SKILLED_NURSING_FACILITY): Payer: Self-pay | Admitting: Adult Health

## 2024-09-29 ENCOUNTER — Encounter: Payer: Self-pay | Admitting: Adult Health

## 2024-09-29 DIAGNOSIS — Z992 Dependence on renal dialysis: Secondary | ICD-10-CM | POA: Diagnosis not present

## 2024-09-29 DIAGNOSIS — N186 End stage renal disease: Secondary | ICD-10-CM | POA: Diagnosis not present

## 2024-09-29 DIAGNOSIS — E034 Atrophy of thyroid (acquired): Secondary | ICD-10-CM | POA: Diagnosis not present

## 2024-09-29 DIAGNOSIS — D631 Anemia in chronic kidney disease: Secondary | ICD-10-CM

## 2024-09-29 DIAGNOSIS — F0390 Unspecified dementia without behavioral disturbance: Secondary | ICD-10-CM

## 2024-09-29 NOTE — Progress Notes (Unsigned)
 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 There are no reports of uncontrolled pain. His anemia is without significant change in status. There are no reports of anxiety or agitation.    Past Medical History:  Diagnosis Date   Abnormal CT scan, kidney 10/06/2011   Acute pyelonephritis 10/07/2011   Anemia    normocytic   Anxiety    mental retardation   Bladder wall thickening 10/06/2011   BPH (benign prostatic hypertrophy)    Diabetes mellitus    Dialysis patient    Tuesday, Thursday and Saturday,    DVT of leg (deep venous thrombosis) (HCC) 12/25/2016   Edema     history of lower extremity edema   GERD (gastroesophageal reflux disease)    Heme positive stool    Hydronephrosis    Hyperkalemia    Hyperlipidemia    Hypernatremia    Hypertension    Hypothyroidism    Impaired speech    Infected prosthetic vascular graft    MR (mental retardation)    Muscle weakness    Obstructive uropathy    Perinephric abscess 10/07/2011   Poor historian poor historian   Primary colorectal adenocarcinoma (HCC) 04/2017   S/p total colectomy   Protein calorie malnutrition    Pyelonephritis    Renal failure (ARF), acute on chronic    Renal insufficiency    chronic history   Sepsis (HCC)    Smoking    Uremia    Urinary retention    UTI (lower urinary tract infection) 10/06/2011    Past Surgical History:  Procedure Laterality Date   A/V FISTULAGRAM N/A  08/13/2018   Procedure: A/V FISTULAGRAM - Right Upper;  Surgeon: Harvey Carlin BRAVO, MD;  Location: [facility] INVASIVE CV LAB;  Service: Cardiovascular;  Laterality: N/A;   A/V FISTULAGRAM N/A 11/22/2018   Procedure: A/V FISTULAGRAM - Right Upper;  Surgeon: Sheree Penne Bruckner, MD;  Location: [facility] INVASIVE CV LAB;  Service: Cardiovascular;  Laterality: N/A;   A/V FISTULAGRAM Left 10/08/2022   Procedure: A/V Fistulagram;  Surgeon: Lanis Fonda BRAVO, MD;  Location: [facility] INVASIVE CV LAB;  Service: Cardiovascular;  Laterality: Left;   A/V FISTULAGRAM Left 05/06/2023   Procedure: A/V Fistulagram;  Surgeon: Magda Debby SAILOR, MD;  Location: [facility] INVASIVE CV LAB;  Service: Cardiovascular;  Laterality: Left;   AV FISTULA PLACEMENT Left 07/06/2015   Procedure:  INSERTION LEFT ARM ARTERIOVENOUS GORTEX GRAFT;  Surgeon: Bruckner GORMAN Blade, MD;  Location: [facility] OR;  Service: Vascular;  Laterality: Left;   AV FISTULA PLACEMENT Right 02/26/2016   Procedure: ARTERIOVENOUS (AV) FISTULA CREATION ;  Surgeon: Bruckner GORMAN Blade, MD;  Location: [facility] OR;  Service: Vascular;  Laterality: Right;   AV FISTULA PLACEMENT Right 11/25/2018   Procedure: INSERTION OF ARTERIOVENOUS (AV) ARTEGRAFT RIGHT UPPER ARM;  Surgeon: Sheree Penne Bruckner, MD;  Location: [facility] OR;  Service: Vascular;  Laterality: Right;   AV FISTULA PLACEMENT Left 05/28/2021   Procedure: LEFT ARM ARTERIOVENOUS (  AV) FISTULA;  Surgeon: Oris Krystal FALCON, MD;  Location: [facility] ORS;  Service: Vascular;  Laterality: Left;   AVGG REMOVAL Left 10/09/2015   Procedure: REMOVAL OF ARTERIOVENOUS GORETEX GRAFT (AVGG) Evacuation of Lymphocele, Vein Patch angioplasty of brachial artery.;  Surgeon: Lonni GORMAN Blade, MD;  Location: [facility] OR;  Service: Vascular;  Laterality: Left;   BASCILIC VEIN TRANSPOSITION Right 02/26/2016   Procedure: Right BASCILIC VEIN TRANSPOSITION;  Surgeon: Lonni GORMAN Blade, MD;  Location: [facility] OR;  Service: Vascular;  Laterality: Right;   BASCILIC VEIN  TRANSPOSITION Left 07/30/2021   Procedure: LEFT ARM SECOND STAGE BASILIC VEIN TRANSPOSITION;  Surgeon: Oris Krystal FALCON, MD;  Location: [facility] ORS;  Service: Vascular;  Laterality: Left;   CIRCUMCISION N/A 01/04/2014   Procedure: CIRCUMCISION ADULT (procedure #1);  Surgeon: Mohammad I Javaid, MD;  Location: [facility] ORS;  Service: Urology;  Laterality: N/A;   COLECTOMY N/A 05/04/2017   Procedure: TOTAL COLECTOMY;  Surgeon: Mavis Anes, MD;  Location: [facility] ORS;  Service: General;  Laterality: N/A;   COLONOSCOPY N/A 04/27/2017   Surgeon: Shaaron Lamar HERO, MD; annular mass in the ascending colon likely representing cancer biopsied, multiple 6-22 mm polyps removed, clean rectum.  Pathology with multiple tubular adenomas, high-grade dysplasia noted in ascending colon and splenic flexure.   CYSTOSCOPY W/ RETROGRADES Bilateral 06/29/2015   Procedure: CYSTOSCOPY, DILATION OF URETHRAL STRICTURE WITH BILATERAL RETROGRADE PYELOGRAM,SUPRAPUBIC TUBE CHANGE;  Surgeon: Donnice Brooks, MD;  Location: [facility] ORS;  Service: Urology;  Laterality: Bilateral;   CYSTOSCOPY WITH URETHRAL DILATATION N/A 12/29/2013   Procedure: CYSTOSCOPY WITH URETHRAL DILATATION;  Surgeon: Emery LILLETTE Blaze, MD;  Location: [facility] ORS;  Service: Urology;  Laterality: N/A;   ESOPHAGOGASTRODUODENOSCOPY N/A 04/27/2017   Procedure: ESOPHAGOGASTRODUODENOSCOPY (EGD);  Surgeon: Shaaron Lamar HERO, MD;  Location: [facility]O [facility];  Service: Endoscopy;  Laterality: N/A;   FLEXIBLE SIGMOIDOSCOPY N/A 12/02/2021   Procedure: FLEXIBLE SIGMOIDOSCOPY;  Surgeon: Cindie Carlin POUR, DO;  Location: [facility];  Service: Endoscopy;  Laterality: N/A;  10:30am, dialysis patient   INSERTION OF DIALYSIS CATHETER Right 11/25/2018   Procedure: INSERTION OF DIALYSIS CATHETER RIGHT INTERNAL JUGULAR;  Surgeon: Sheree Penne Lonni, MD;  Location: [facility] OR;  Service: Vascular;  Laterality: Right;   IR AV DIALY SHUNT INTRO NEEDLE/INTRAC INITIAL W/PTA/STENT/IMG LT Left 02/16/2024   IR AV DIALY  SHUNT INTRO NEEDLE/INTRACATH INITIAL W/PTA/IMG RIGHT Right 09/07/2018   IR FLUORO GUIDE CV LINE RIGHT  10/16/2020   IR FLUORO GUIDE CV LINE RIGHT  10/03/2022   IR REMOVAL TUN CV CATH W/O FL  01/12/2019   IR THROMBECTOMY AV FISTULA W/THROMBOLYSIS/PTA INC/SHUNT/IMG RIGHT Right 04/26/2018   IR US  GUIDE VASC ACCESS LEFT  02/17/2024   IR US  GUIDE VASC ACCESS RIGHT  04/26/2018   IR US  GUIDE VASC ACCESS RIGHT  09/07/2018   IR US  GUIDE VASC ACCESS RIGHT  10/16/2020   IR US  GUIDE VASC ACCESS RIGHT  10/16/2020   IR US  GUIDE VASC ACCESS RIGHT  10/03/2022   ORIF FEMUR FRACTURE Right 11/22/2016   Procedure: OPEN REDUCTION INTERNAL FIXATION (ORIF) DISTAL FEMUR FRACTURE;  Surgeon: Redell Shoals, MD;  Location: [facility] OR;  Service: Orthopedics;  Laterality: Right;   PATCH ANGIOPLASTY Right 12/10/2017   Procedure: PATCH ANGIOPLASTY;  Surgeon: Blade Lonni GORMAN, MD;  Location: [facility] OR;  Service: Vascular;  Laterality: Right;   PERIPHERAL VASCULAR BALLOON ANGIOPLASTY  08/13/2018   Procedure: PERIPHERAL VASCULAR BALLOON ANGIOPLASTY;  Surgeon: Harvey Carlin BRAVO, MD;  Location: [facility] INVASIVE CV LAB;  Service: Cardiovascular;;  right  AV fistula    PERIPHERAL VASCULAR BALLOON ANGIOPLASTY  11/22/2018   Procedure: PERIPHERAL VASCULAR BALLOON ANGIOPLASTY;  Surgeon: Sheree Penne Bruckner, MD;  Location: [facility] INVASIVE CV LAB;  Service: Cardiovascular;;  rt AV fistula   PERIPHERAL VASCULAR BALLOON ANGIOPLASTY  10/08/2022   Procedure: PERIPHERAL VASCULAR BALLOON ANGIOPLASTY;  Surgeon: Lanis Fonda BRAVO, MD;  Location: [facility] INVASIVE CV LAB;  Service: Cardiovascular;;  arterial anastamosis   PERIPHERAL VASCULAR CATHETERIZATION N/A 10/08/2015   Procedure: A/V Shuntogram;  Surgeon: Bruckner GORMAN Blade, MD;  Location: [facility] INVASIVE CV LAB;  Service: Cardiovascular;  Laterality: N/A;   PERIPHERAL VASCULAR INTERVENTION Left 10/08/2022   Procedure: PERIPHERAL VASCULAR INTERVENTION;  Surgeon: Lanis Fonda BRAVO, MD;  Location: [facility] INVASIVE  CV LAB;  Service: Cardiovascular;  Laterality: Left;  left AVF   REMOVAL OF A DIALYSIS CATHETER N/A 12/03/2021   Procedure: MINOR REMOVAL OF A TUNNELED DIALYSIS CATHETER;  Surgeon: Oris Krystal FALCON, MD;  Location: [facility] ORS;  Service: Vascular;  Laterality: N/A;   REMOVAL OF A DIALYSIS CATHETER N/A 11/18/2022   Procedure: MINOR REMOVAL OF A TUNNELED DIALYSIS CATHETER;  Surgeon: Oris Krystal FALCON, MD;  Location: [facility] ORS;  Service: Vascular;  Laterality: N/A;   THROMBECTOMY W/ EMBOLECTOMY Right 12/10/2017   Procedure: THROMBECTOMY REVISION RIGHT ARM  ARTERIOVENOUS FISTULA;  Surgeon: Blade Bruckner GORMAN, MD;  Location: [facility] OR;  Service: Vascular;  Laterality: Right;   TRANSURETHRAL RESECTION OF PROSTATE N/A 01/04/2014   Procedure: TRANSURETHRAL RESECTION OF THE PROSTATE (TURP) (procedure #2);  Surgeon: Mohammad I Javaid, MD;  Location: [facility] ORS;  Service: Urology;  Laterality: N/A;    Social History   Socioeconomic History   Marital status: Single    Spouse name: Not on file   Number of children: Not on file   Years of education: Not on file   Highest education level: Not on file  Occupational History   Occupation: retired   Tobacco Use   Smoking status: Never   Smokeless tobacco: Never  Vaping Use   Vaping status: Never Used  Substance and Sexual Activity   Alcohol use: No   Drug use: No   Sexual activity: Not Currently  Other Topics Concern   Not on file  Social History Narrative   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   Long term resident of [facility]    Social Drivers of Health   Financial Resource Strain: Low Risk  (02/18/2018)   Overall Financial Resource Strain (CARDIA)    Difficulty of Paying Living Expenses: Not hard at all  Food Insecurity: No Food Insecurity (02/17/2024)   Hunger Vital Sign    Worried About Running Out of Food in the Last Year: Never true    Ran Out of Food in the Last Year: Never true  Transportation Needs: No Transportation Needs  (02/17/2024)   PRAPARE - Administrator, Civil Service (Medical): No    Lack of Transportation (Non-Medical): No  Physical Activity: Unknown (09/15/2019)   Exercise Vital Sign    Days of Exercise per Week: Not on file    Minutes of Exercise per Session: 0 min  Stress: No Stress Concern Present (02/18/2018)   [employer] of Occupational Health - Occupational Stress Questionnaire    Feeling of Stress : Not at all  Social Connections: Socially Isolated (02/17/2024)   Social Connection and Isolation Panel    Frequency of Communication with Friends and Family: Three times a week    Frequency of Social Gatherings with Friends and  Family: Once a week    Attends Religious Services: Never    Active Member of Clubs or Organizations: No    Attends Banker Meetings: Never    Marital Status: Never married  Intimate Partner Violence: Not At Risk (02/17/2024)   Humiliation, Afraid, Rape, and Kick questionnaire    Fear of Current or Ex-Partner: No    Emotionally Abused: No    Physically Abused: No    Sexually Abused: No   Family History  Problem Relation Age of Onset   Cancer Mother    Colon cancer Neg Hx       VITAL SIGNS BP 115/65   Pulse 87   Temp 98.3 F (36.8 C)   Resp 20   Ht 5' 9 (1.753 m)   Wt 211 lb 12.8 oz (96.1 kg)   SpO2 96%   BMI 31.28 kg/m   Outpatient Encounter Medications as of 09/29/2024  Medication Sig Note   aspirin  81 MG chewable tablet Chew 81 mg by mouth daily.    insulin  glargine-yfgn (SEMGLEE , YFGN,) 100 UNIT/ML Pen Inject 12 Units into the skin at bedtime.    levothyroxine  (SYNTHROID ) 150 MCG tablet Take 150 mcg by mouth daily before breakfast.    midodrine  (PROAMATINE ) 10 MG tablet Take 10 mg by mouth See admin instructions. Send medication with resident on Tues/Thurs/Sat to dialysis. Do not administer at [facility], give to resident to take with him to dialysis. 02/17/2024: Confirmed per [facility] - pt only receives while at dialysis  on Tues/Thurs/Sat. Pt receives 2 strengths of midodrine  to equal 15mg  per dose.   midodrine  (PROAMATINE ) 5 MG tablet Take 5 mg by mouth See admin instructions. Send medication with resident on Tues/Thurs/Sat to dialysis. Do not administer at [facility], give to resident to take with him to dialysis.    Nutritional Supplements (,FEEDING SUPPLEMENT, PROSOURCE PLUS) liquid Take 30 mLs by mouth 2 (two) times daily.    Omega-3 Fatty Acids (OMEGA-3 FISH OIL PO) Take 2 capsules by mouth at bedtime. Omega 3 - DHA - EPA - Fish oil 300-1000mg  capsules    rosuvastatin  (CRESTOR ) 20 MG tablet Take 20 mg by mouth at bedtime.    sevelamer  carbonate (RENVELA ) 800 MG tablet Take 3 tablets (2,400 mg total) by mouth 3 (three) times daily with meals.    tamsulosin  (FLOMAX ) 0.4 MG CAPS capsule Take 0.4 mg by mouth at bedtime. Give 30 minutes after a meal. Do not crush or chew    torsemide  (DEMADEX ) 20 MG tablet Take 20 mg by mouth See admin instructions. Give 1 tablet (20mg ) by mouth once a day in the afternoon on Sunday, Monday, Wednesday, Friday.    UNABLE TO FIND Med Name: BD AutoShield Duo pen needle (30g x 3/16 needle)    No facility-administered encounter medications on file as of 09/29/2024.     SIGNIFICANT DIAGNOSTIC EXAMS  LABS REVIEWED PREVIOUS;       12-17-23: wbc 3.9; hgb 10.6; hct 30.5; mcv 103.7 plt 110; hgb A1c 6.6; PSA 8.02; chol 118; ldl 45; trig 222; hdl 29; tsh 10.663 02-16-24: wbc 4.3; hgb 9.3; hct 27.9; mcv 104.5 plt 81; glucose 138; bun 80; creat 17.85; k+ 5.8; na++ 134; ca 9.3 gfr 2; protein 7.3 albumin  3.4 02-18-24: wbc 3.9; hgb 8.7; hct 25.5; mcv 102.4 plt 78; glucose 133; bun 52; creat 13.86; k+ 4.7; na++ 137; ca 8.7; gfr 3; protein 6.6 albumin  3.1 mag 2.5 phos 6.9  02-23-24: wbc 4.8; hgb 8.8; hct 26.8; mcv  105.9 plt 140; tsh 2.608; vitamin B 12: 4099 02-26-24:vitamin B12: 2246 03-24-24: hgb A1c 6.0 04-15-24: PSA 4.91   TODAY  09-01-24: wbc 4.7; hgb 11.0; hct 32.7; mcv 103.8 plt 110; glucose  191; bun 37; creat 9,39; k+ 4.0; na++ 138; ca 8.7; gfr 5 protein 7.0 albumin  3.8; hgb 1c 7.7; tsh 4.190  Review of Systems  Constitutional:  Negative for malaise/fatigue.  Respiratory:  Negative for cough and shortness of breath.   Cardiovascular:  Negative for chest pain, palpitations and leg swelling.  Gastrointestinal:  Negative for abdominal pain, constipation and heartburn.  Musculoskeletal:  Negative for back pain, joint pain and myalgias.  Skin: Negative.   Neurological:  Negative for dizziness.  Psychiatric/Behavioral:  The patient is not nervous/anxious.     Physical Exam Constitutional:      General: He is not in acute distress.    Appearance: He is well-developed. He is not diaphoretic.  Neck:     Thyroid : No thyromegaly.  Cardiovascular:     Rate and Rhythm: Normal rate and regular rhythm.     Heart sounds: Normal heart sounds.  Pulmonary:     Effort: Pulmonary effort is normal. No respiratory distress.     Breath sounds: Normal breath sounds.  Abdominal:     General: Bowel sounds are normal. There is no distension.     Palpations: Abdomen is soft.     Tenderness: There is no abdominal tenderness.  Musculoskeletal:        General: Normal range of motion.     Cervical back: Neck supple.  Lymphadenopathy:     Cervical: No cervical adenopathy.  Skin:    General: Skin is warm and dry.     Comments:  Left arm fistula +thrill/bruit     Neurological:     Mental Status: He is alert. Mental status is at baseline.     Comments: SLUMS 2/30   Psychiatric:        Mood and Affect: Mood normal.     ASSESSMENT/ PLAN:  TODAY  Major neurocognitive deficits: SLUMS 2/30  2. Other specified hypothyroidism: tsh 4.190 will continue synthroid  150 mcg daily   3. 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 Major depression single episode moderate: is presently off medications.   5. Elevated PSA: is lower at 4.91   6.  Osteopenia: t score -2.052 is on supplements  7. GERD without esophagitis: is off PPI  8. History of colon cancer: s/t colectomy last seen by GI on 12-19-21 ct scan done 12-30-21  9. Dyslipidemia associated with type 2 diabetes mellitus: ldl 37; trig 420; will continue crestor  20 mg daily   10. Vitamin B 12 deficiency: is on supplements  11. Secondary hyperparathyroidism of renal origin: will monitor   12. Bilateral lower extremity edema: will continue demedex 20 mg four days weekly  13. 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 Thrombocytopenia: plt 81   15. Aortic atherosclerosis (Ct 12-30-32) is on asa and statin  16. 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: on 3 days weekly; is unable to adhere to a fluid restriction: renvela  2400 mg three times daily   17. BPH: will continue flomax  0.4 mg daily    Barnie Seip NP [facility]   call [phone]

## 2024-10-04 DIAGNOSIS — F70 Mild intellectual disabilities: Secondary | ICD-10-CM | POA: Diagnosis not present

## 2024-10-04 DIAGNOSIS — E113593 Type 2 diabetes mellitus with proliferative diabetic retinopathy without macular edema, bilateral: Secondary | ICD-10-CM | POA: Diagnosis not present

## 2024-10-04 DIAGNOSIS — F0390 Unspecified dementia without behavioral disturbance: Secondary | ICD-10-CM | POA: Insufficient documentation

## 2024-10-04 DIAGNOSIS — H25813 Combined forms of age-related cataract, bilateral: Secondary | ICD-10-CM | POA: Diagnosis not present

## 2024-10-04 DIAGNOSIS — F4322 Adjustment disorder with anxiety: Secondary | ICD-10-CM | POA: Diagnosis not present

## 2024-10-21 ENCOUNTER — Encounter (HOSPITAL_COMMUNITY)
Admission: RE | Admit: 2024-10-21 | Discharge: 2024-10-21 | Disposition: A | Discharge status: Home / Self Care | Source: Other Acute Inpatient Hospital | Attending: Internal Medicine | Admitting: Internal Medicine

## 2024-10-21 DIAGNOSIS — Z1152 Encounter for screening for COVID-19: Secondary | ICD-10-CM | POA: Diagnosis not present

## 2024-10-21 DIAGNOSIS — Z20828 Contact with and (suspected) exposure to other viral communicable diseases: Secondary | ICD-10-CM | POA: Diagnosis present

## 2024-10-21 DIAGNOSIS — J1000 Influenza due to other identified influenza virus with unspecified type of pneumonia: Secondary | ICD-10-CM | POA: Insufficient documentation

## 2024-10-21 LAB — RESP PANEL BY RT-PCR (RSV, FLU A&B, COVID)  RVPGX2
Influenza A by PCR: NEGATIVE
Influenza B by PCR: NEGATIVE
Resp Syncytial Virus by PCR: NEGATIVE
SARS Coronavirus 2 by RT PCR: NEGATIVE

## 2024-10-25 ENCOUNTER — Telehealth: Payer: Self-pay

## 2024-10-25 NOTE — Telephone Encounter (Signed)
 [facility] staff member called to ask if they could perform patient labs at the center, they were advised they could and they would need to bring results to upcoming appt lab type and appt time and date was confirmed

## 2024-11-04 ENCOUNTER — Non-Acute Institutional Stay (SKILLED_NURSING_FACILITY): Payer: Self-pay | Admitting: Internal Medicine

## 2024-11-04 ENCOUNTER — Encounter: Payer: Self-pay | Admitting: Internal Medicine

## 2024-11-04 DIAGNOSIS — Z992 Dependence on renal dialysis: Secondary | ICD-10-CM

## 2024-11-04 DIAGNOSIS — D649 Anemia, unspecified: Secondary | ICD-10-CM

## 2024-11-04 DIAGNOSIS — E1122 Type 2 diabetes mellitus with diabetic chronic kidney disease: Secondary | ICD-10-CM

## 2024-11-04 DIAGNOSIS — I120 Hypertensive chronic kidney disease with stage 5 chronic kidney disease or end stage renal disease: Secondary | ICD-10-CM

## 2024-11-04 DIAGNOSIS — E034 Atrophy of thyroid (acquired): Secondary | ICD-10-CM

## 2024-11-04 DIAGNOSIS — N186 End stage renal disease: Secondary | ICD-10-CM | POA: Diagnosis not present

## 2024-11-04 DIAGNOSIS — Z794 Long term (current) use of insulin: Secondary | ICD-10-CM

## 2024-11-04 NOTE — Assessment & Plan Note (Signed)
 Hemodialysis is completed Tuesday, Thursday, and Saturday.  Renal function is monitored at that facility.  His most recent A1c was 7.7% which represents adequate control in the context of his end-stage renal disease on hemodialysis.  With the renal disease; discordance between A1c & glucoses may occur.

## 2024-11-04 NOTE — Progress Notes (Unsigned)
 "   NURSING HOME LOCATION:  [facility] ROOM NUMBER:  [id number]  CODE STATUS:  Full Code  PCP: Landy Barnie RAMAN, NP   This is a nursing facility follow up visit of chronic medical diagnoses to document compliance with Regulation 483.30 (c) in The Long Term Care Survey Manual Phase 2 which mandates caregiver visit ( visits can alternate among physician, PA or NP as per statutes) within 10 days of 30 days / 60 days/ 90 days post admission to SNF date  .  Interim medical record and care since last SNF visit was updated with review of diagnostic studies and change in clinical status since last visit were documented.  HPI: He is a permanent resident of this facility with medical diagnoses of end-stage renal disease on dialysis; chronic anemia; BPH; history of DVT; GERD; dyslipidemia; essential hypertension; hypothyroidism; protein/caloric malnutrition; history of colorectal adenocarcinoma; and mental retardation. Most recent labs were completed 09/01/2024 and revealed a creatinine of 9.39 and GFR of 5.  As noted he is on dialysis Tuesday, Thursday, and Saturday.  Macrocytic anemia was present with H/H of 11/32.7 and MCV 103.8.  Platelet count is reduced to 110,000.  Expected macrocytosis in May he has B12 level was 2246, supranormal. A1c was 7.7% and TSH 4.190.  Review of systems completion was hindered by his neurocognitive impairment.  Responses were delayed and a garbled monosyllabic no..  Constitutional: No fever, significant weight change, fatigue  Eyes: No redness, discharge, pain, vision change ENT/mouth: No nasal congestion,  purulent discharge, earache, change in hearing, sore throat  Cardiovascular: No chest pain, palpitations, paroxysmal nocturnal dyspnea, claudication, edema  Respiratory: No cough, sputum production, hemoptysis, DOE, significant snoring, apnea   Gastrointestinal: No heartburn, dysphagia, abdominal pain, nausea /vomiting, rectal bleeding, melena, change in  bowels Genitourinary: No dysuria, hematuria, pyuria, incontinence, nocturia Musculoskeletal: No joint stiffness, joint swelling, weakness, pain Dermatologic: No rash, pruritus, change in appearance of skin Neurologic: No dizziness, headache, syncope, seizures, numbness, tingling Psychiatric: No significant anxiety, depression, insomnia, anorexia Endocrine: No change in hair/skin/nails, excessive thirst, excessive hunger, excessive urination  Hematologic/lymphatic: No significant bruising, lymphadenopathy, abnormal bleeding Allergy/immunology: No itchy/watery eyes, significant sneezing, urticaria, angioedema  Physical exam:  Pertinent or positive findings: Facies tended to be somewhat blank.  He is missing maxillary teeth.  There is marked coating of an encrusted meant of the mandibular teeth.  Grade 1/2 systolic murmur is noted.  Abdomen is protuberant.  He has trace edema at the sock line.  Pedal pulses are not palpable.  The right thumbnail is dark.  General appearance: Adequately nourished; no acute distress, increased work of breathing is present.   Lymphatic: No lymphadenopathy about the head, neck, axilla. Eyes: No conjunctival inflammation or lid edema is present. There is no scleral icterus. Ears:  External ear exam shows no significant lesions or deformities.   Nose:  External nasal examination shows no deformity or inflammation. Nasal mucosa are pink and moist without lesions, exudates Oral exam:  Lips and gums are healthy appearing. There is no oropharyngeal erythema or exudate. Neck:  No thyromegaly, masses, tenderness noted.    Heart:  Normal rate and regular rhythm. S1 and S2 normal without gallop, murmur, click, rub .  Lungs: Chest clear to auscultation without wheezes, rhonchi, rales, rubs. Abdomen: Bowel sounds are normal. Abdomen is soft and nontender with no organomegaly, hernias, masses. GU: Deferred  Extremities:  No cyanosis, clubbing, edema  Neurologic exam : Cn 2-7  intact Strength equal  in  upper & lower extremities Balance, Rhomberg, finger to nose testing could not be completed due to clinical state Deep tendon reflexes are equal Skin: Warm & dry w/o tenting. No significant lesions or rash.  See summary under each active problem in the Problem List with associated updated therapeutic plan  "

## 2024-11-04 NOTE — Patient Instructions (Signed)
 See assessment and plan under each diagnosis in the problem list and acutely for this visit

## 2024-11-04 NOTE — Assessment & Plan Note (Signed)
 There is been significant improvement in the anemia with current H/H of 11/32.7.  Indices are macrocytic but his B12 level has been normal.  No bleeding dyscrasias reported.  Continue to monitor.

## 2024-11-04 NOTE — Assessment & Plan Note (Signed)
 Current TSH is therapeutic.  No change indicated.  Monitor annually.

## 2024-11-11 ENCOUNTER — Non-Acute Institutional Stay (SKILLED_NURSING_FACILITY): Payer: Self-pay | Admitting: Adult Health

## 2024-11-11 ENCOUNTER — Other Ambulatory Visit

## 2024-11-11 ENCOUNTER — Other Ambulatory Visit (HOSPITAL_COMMUNITY)
Admission: RE | Admit: 2024-11-11 | Discharge: 2024-11-11 | Disposition: A | Discharge status: Home / Self Care | Source: Skilled Nursing Facility | Attending: Adult Health | Admitting: Adult Health

## 2024-11-11 ENCOUNTER — Encounter: Payer: Self-pay | Admitting: Adult Health

## 2024-11-11 DIAGNOSIS — Z992 Dependence on renal dialysis: Secondary | ICD-10-CM | POA: Diagnosis not present

## 2024-11-11 DIAGNOSIS — R972 Elevated prostate specific antigen [PSA]: Secondary | ICD-10-CM | POA: Diagnosis not present

## 2024-11-11 DIAGNOSIS — I700 Atherosclerosis of aorta: Secondary | ICD-10-CM

## 2024-11-11 DIAGNOSIS — F0390 Unspecified dementia without behavioral disturbance: Secondary | ICD-10-CM

## 2024-11-11 DIAGNOSIS — I120 Hypertensive chronic kidney disease with stage 5 chronic kidney disease or end stage renal disease: Secondary | ICD-10-CM | POA: Insufficient documentation

## 2024-11-11 LAB — PSA: Prostatic Specific Antigen: 7.05 ng/mL — ABNORMAL HIGH (ref 0.00–4.00)

## 2024-11-11 LAB — T4, FREE: Free T4: 1.59 ng/dL (ref 0.80–2.00)

## 2024-11-11 NOTE — Progress Notes (Signed)
 " Location:  [facility] Room Number: 114 Place of Service:  SNF (31)   CODE STATUS: full   Allergies[1]  Chief Complaint  Patient presents with   Acute Visit    Care plan meeting     HPI:  We have come together for his care plan meeting. Family present. BIMS 6/15 mood 0/30. He uses wheelchair without falls. He requires independent to moderate assist with his adl care. He is continent of bladder and bowel. Dietary: regular diet feeds self; weight is 214 pounds appetite 76-100%. Therapy: ambulate 120 feet with rolling walker and contact guard; sit/stand min assist ;propel self in wheelchair . Activities: does attend. He will continue to be followed for his chronic illnesses including:  Aortic atherosclerosis  Major neurocognitive disorder Dependence on renal dialysis  Past Medical History:  Diagnosis Date   Abnormal CT scan, kidney 10/06/2011   Acute pyelonephritis 10/07/2011   Anemia    normocytic   Anxiety    mental retardation   Bladder wall thickening 10/06/2011   BPH (benign prostatic hypertrophy)    Diabetes mellitus    Dialysis patient    Tuesday, Thursday and Saturday,    DVT of leg (deep venous thrombosis) (HCC) 12/25/2016   Edema     history of lower extremity edema   GERD (gastroesophageal reflux disease)    Heme positive stool    Hydronephrosis    Hyperkalemia    Hyperlipidemia    Hypernatremia    Hypertension    Hypothyroidism    Impaired speech    Infected prosthetic vascular graft    MR (mental retardation)    Muscle weakness    Obstructive uropathy    Perinephric abscess 10/07/2011   Poor historian poor historian   Primary colorectal adenocarcinoma (HCC) 04/2017   S/p total colectomy   Protein calorie malnutrition    Pyelonephritis    Renal failure (ARF), acute on chronic    Renal insufficiency    chronic history   Sepsis (HCC)    Smoking    Uremia    Urinary retention    UTI (lower urinary tract infection) 10/06/2011     Past Surgical History:  Procedure Laterality Date   A/V FISTULAGRAM N/A 08/13/2018   Procedure: A/V FISTULAGRAM - Right Upper;  Surgeon: Harvey Carlin BRAVO, MD;  Location: [facility] INVASIVE CV LAB;  Service: Cardiovascular;  Laterality: N/A;   A/V FISTULAGRAM N/A 11/22/2018   Procedure: A/V FISTULAGRAM - Right Upper;  Surgeon: Sheree Penne Bruckner, MD;  Location: [facility] INVASIVE CV LAB;  Service: Cardiovascular;  Laterality: N/A;   A/V FISTULAGRAM Left 10/08/2022   Procedure: A/V Fistulagram;  Surgeon: Lanis Fonda BRAVO, MD;  Location: [facility] INVASIVE CV LAB;  Service: Cardiovascular;  Laterality: Left;   A/V FISTULAGRAM Left 05/06/2023   Procedure: A/V Fistulagram;  Surgeon: Magda Debby SAILOR, MD;  Location: [facility] INVASIVE CV LAB;  Service: Cardiovascular;  Laterality: Left;   AV FISTULA PLACEMENT Left 07/06/2015   Procedure:  INSERTION LEFT ARM ARTERIOVENOUS GORTEX GRAFT;  Surgeon: Bruckner GORMAN Blade, MD;  Location: [facility] OR;  Service: Vascular;  Laterality: Left;   AV FISTULA PLACEMENT Right 02/26/2016   Procedure: ARTERIOVENOUS (AV) FISTULA CREATION ;  Surgeon: Bruckner GORMAN Blade, MD;  Location: [facility] OR;  Service: Vascular;  Laterality: Right;   AV FISTULA PLACEMENT Right 11/25/2018   Procedure: INSERTION OF ARTERIOVENOUS (AV) ARTEGRAFT RIGHT UPPER ARM;  Surgeon: Sheree Penne Bruckner, MD;  Location: [facility] OR;  Service: Vascular;  Laterality: Right;  AV FISTULA PLACEMENT Left 05/28/2021   Procedure: LEFT ARM ARTERIOVENOUS (AV) FISTULA;  Surgeon: Oris Krystal FALCON, MD;  Location: [facility] ORS;  Service: Vascular;  Laterality: Left;   AVGG REMOVAL Left 10/09/2015   Procedure: REMOVAL OF ARTERIOVENOUS GORETEX GRAFT (AVGG) Evacuation of Lymphocele, Vein Patch angioplasty of brachial artery.;  Surgeon: Lonni GORMAN Blade, MD;  Location: [facility] OR;  Service: Vascular;  Laterality: Left;   BASCILIC VEIN TRANSPOSITION Right 02/26/2016   Procedure: Right BASCILIC VEIN TRANSPOSITION;  Surgeon: Lonni GORMAN Blade,  MD;  Location: [facility] OR;  Service: Vascular;  Laterality: Right;   BASCILIC VEIN TRANSPOSITION Left 07/30/2021   Procedure: LEFT ARM SECOND STAGE BASILIC VEIN TRANSPOSITION;  Surgeon: Oris Krystal FALCON, MD;  Location: [facility] ORS;  Service: Vascular;  Laterality: Left;   CIRCUMCISION N/A 01/04/2014   Procedure: CIRCUMCISION ADULT (procedure #1);  Surgeon: Mohammad I Javaid, MD;  Location: [facility] ORS;  Service: Urology;  Laterality: N/A;   COLECTOMY N/A 05/04/2017   Procedure: TOTAL COLECTOMY;  Surgeon: Mavis Anes, MD;  Location: [facility] ORS;  Service: General;  Laterality: N/A;   COLONOSCOPY N/A 04/27/2017   Surgeon: Shaaron Lamar HERO, MD; annular mass in the ascending colon likely representing cancer biopsied, multiple 6-22 mm polyps removed, clean rectum.  Pathology with multiple tubular adenomas, high-grade dysplasia noted in ascending colon and splenic flexure.   CYSTOSCOPY W/ RETROGRADES Bilateral 06/29/2015   Procedure: CYSTOSCOPY, DILATION OF URETHRAL STRICTURE WITH BILATERAL RETROGRADE PYELOGRAM,SUPRAPUBIC TUBE CHANGE;  Surgeon: Donnice Brooks, MD;  Location: [facility] ORS;  Service: Urology;  Laterality: Bilateral;   CYSTOSCOPY WITH URETHRAL DILATATION N/A 12/29/2013   Procedure: CYSTOSCOPY WITH URETHRAL DILATATION;  Surgeon: Emery LILLETTE Blaze, MD;  Location: [facility] ORS;  Service: Urology;  Laterality: N/A;   ESOPHAGOGASTRODUODENOSCOPY N/A 04/27/2017   Procedure: ESOPHAGOGASTRODUODENOSCOPY (EGD);  Surgeon: Shaaron Lamar HERO, MD;  Location: [facility];  Service: Endoscopy;  Laterality: N/A;   FLEXIBLE SIGMOIDOSCOPY N/A 12/02/2021   Procedure: FLEXIBLE SIGMOIDOSCOPY;  Surgeon: Cindie Carlin POUR, DO;  Location: [facility];  Service: Endoscopy;  Laterality: N/A;  10:30am, dialysis patient   INSERTION OF DIALYSIS CATHETER Right 11/25/2018   Procedure: INSERTION OF DIALYSIS CATHETER RIGHT INTERNAL JUGULAR;  Surgeon: Sheree Penne Lonni, MD;  Location: [facility] OR;  Service: Vascular;  Laterality: Right;   IR AV DIALY  SHUNT INTRO NEEDLE/INTRAC INITIAL W/PTA/STENT/IMG LT Left 02/16/2024   IR AV DIALY SHUNT INTRO NEEDLE/INTRACATH INITIAL W/PTA/IMG RIGHT Right 09/07/2018   IR FLUORO GUIDE CV LINE RIGHT  10/16/2020   IR FLUORO GUIDE CV LINE RIGHT  10/03/2022   IR REMOVAL TUN CV CATH W/O FL  01/12/2019   IR THROMBECTOMY AV FISTULA W/THROMBOLYSIS/PTA INC/SHUNT/IMG RIGHT Right 04/26/2018   IR US  GUIDE VASC ACCESS LEFT  02/17/2024   IR US  GUIDE VASC ACCESS RIGHT  04/26/2018   IR US  GUIDE VASC ACCESS RIGHT  09/07/2018   IR US  GUIDE VASC ACCESS RIGHT  10/16/2020   IR US  GUIDE VASC ACCESS RIGHT  10/16/2020   IR US  GUIDE VASC ACCESS RIGHT  10/03/2022   ORIF FEMUR FRACTURE Right 11/22/2016   Procedure: OPEN REDUCTION INTERNAL FIXATION (ORIF) DISTAL FEMUR FRACTURE;  Surgeon: Redell Shoals, MD;  Location: [facility] OR;  Service: Orthopedics;  Laterality: Right;   PATCH ANGIOPLASTY Right 12/10/2017   Procedure: PATCH ANGIOPLASTY;  Surgeon: Blade Lonni GORMAN, MD;  Location: [facility] OR;  Service: Vascular;  Laterality: Right;   PERIPHERAL VASCULAR BALLOON ANGIOPLASTY  08/13/2018   Procedure: PERIPHERAL VASCULAR BALLOON ANGIOPLASTY;  Surgeon: Harvey Carlin BRAVO, MD;  Location: [facility] INVAS[facility] CV LAB;  Service: Cardiovascular;;  right AV fistula    PERIPHERAL VASCULAR BALLOON ANGIOPLASTY  11/22/2018   Procedure: PERIPHERAL VASCULAR BALLOON ANGIOPLASTY;  Surgeon: Sheree Penne Bruckner, MD;  Location: [facility] INVASIVE CV LAB;  Service: Cardiovascular;;  rt AV fistula   PERIPHERAL VASCULAR BALLOON ANGIOPLASTY  10/08/2022   Procedure: PERIPHERAL VASCULAR BALLOON ANGIOPLASTY;  Surgeon: Lanis Fonda BRAVO, MD;  Location: [facility] INVASIVE CV LAB;  Service: Cardiovascular;;  arterial anastamosis   PERIPHERAL VASCULAR CATHETERIZATION N/A 10/08/2015   Procedure: A/V Shuntogram;  Surgeon: Bruckner GORMAN Blade, MD;  Location: [facility] INVASIVE CV LAB;  Service: Cardiovascular;  Laterality: N/A;   PERIPHERAL VASCULAR INTERVENTION Left 10/08/2022   Procedure:  PERIPHERAL VASCULAR INTERVENTION;  Surgeon: Lanis Fonda BRAVO, MD;  Location: [facility] INVASIVE CV LAB;  Service: Cardiovascular;  Laterality: Left;  left AVF   REMOVAL OF A DIALYSIS CATHETER N/A 12/03/2021   Procedure: MINOR REMOVAL OF A TUNNELED DIALYSIS CATHETER;  Surgeon: Oris Krystal FALCON, MD;  Location: [facility];  Service: Vascular;  Laterality: N/A;   REMOVAL OF A DIALYSIS CATHETER N/A 11/18/2022   Procedure: MINOR REMOVAL OF A TUNNELED DIALYSIS CATHETER;  Surgeon: Oris Krystal FALCON, MD;  Location: [facility] ORS;  Service: Vascular;  Laterality: N/A;   THROMBECTOMY W/ EMBOLECTOMY Right 12/10/2017   Procedure: THROMBECTOMY REVISION RIGHT ARM  ARTERIOVENOUS FISTULA;  Surgeon: Blade Bruckner GORMAN, MD;  Location: [facility] OR;  Service: Vascular;  Laterality: Right;   TRANSURETHRAL RESECTION OF PROSTATE N/A 01/04/2014   Procedure: TRANSURETHRAL RESECTION OF THE PROSTATE (TURP) (procedure #2);  Surgeon: Mohammad I Javaid, MD;  Location: [facility] ORS;  Service: Urology;  Laterality: N/A;    Social History   Socioeconomic History   Marital status: Single    Spouse name: Not on file   Number of children: Not on file   Years of education: Not on file   Highest education level: Not on file  Occupational History   Occupation: retired   Tobacco Use   Smoking status: Never   Smokeless tobacco: Never  Vaping Use   Vaping status: Never Used  Substance and Sexual Activity   Alcohol use: No   Drug use: No   Sexual activity: Not Currently  Other Topics Concern   Not on file  Social History Narrative   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   [facility] resident of [facility]    Social Drivers of Health   Tobacco Use: Low Risk (11/11/2024)   Patient History    Smoking Tobacco Use: Never    Smokeless Tobacco Use: Never    Passive Exposure: Not on file  Financial Resource Strain: Not on file  Food Insecurity: No Food Insecurity (02/17/2024)   Hunger Vital Sign    Worried About Running Out of  Food in the Last Year: Never true    Ran Out of Food in the Last Year: Never true  Transportation Needs: No Transportation Needs (02/17/2024)   PRAPARE - Administrator, Civil Service (Medical): No    Lack of Transportation (Non-Medical): No  Physical Activity: Not on file  Stress: Not on file  Social Connections: Socially Isolated (02/17/2024)   Social Connection and Isolation Panel    Frequency of Communication with Friends and Family: Three times a week    Frequency of Social Gatherings with Friends and Family: Once a week    Attends Religious Services: Never    Database Administrator or Organizations: No    Attends [employer]  or Organization Meetings: Never    Marital Status: Never married  Intimate Partner Violence: Not At Risk (02/17/2024)   Humiliation, Afraid, Rape, and Kick questionnaire    Fear of Current or Ex-Partner: No    Emotionally Abused: No    Physically Abused: No    Sexually Abused: No  Depression (PHQ2-9): Low Risk (02/24/2024)   Depression (PHQ2-9)    PHQ-2 Score: 0  Alcohol Screen: Not on file  Housing: Low Risk (02/17/2024)   Housing Stability Vital Sign    Unable to Pay for Housing in the Last Year: No    Number of Times Moved in the Last Year: 0    Homeless in the Last Year: No  Utilities: Not At Risk (02/17/2024)   [facility] Utilities    Threatened with loss of utilities: No  Health Literacy: Not on file   Family History  Problem Relation Age of Onset   Cancer Mother    Colon cancer Neg Hx       VITAL SIGNS BP 111/61   Pulse 93   Temp 97.6 F (36.4 C)   Resp 20   Ht 5' 9 (1.753 m)   Wt 214 lb 3.2 oz (97.2 kg)   SpO2 92%   BMI 31.63 kg/m   Outpatient Encounter Medications as of 11/11/2024  Medication Sig Note   aspirin  81 MG chewable tablet Chew 81 mg by mouth daily.    insulin  glargine-yfgn (SEMGLEE , YFGN,) 100 UNIT/ML Pen Inject 12 Units into the skin at bedtime.    levothyroxine  (SYNTHROID ) 150 MCG tablet Take 150 mcg by mouth daily  before breakfast.    midodrine  (PROAMATINE ) 10 MG tablet Take 10 mg by mouth See admin instructions. Send medication with resident on Tues/Thurs/Sat to dialysis. Do not administer at [facility], give to resident to take with him to dialysis. 02/17/2024: Confirmed per [facility] - pt only receives while at dialysis on Tues/Thurs/Sat. Pt receives 2 strengths of midodrine  to equal 15mg  per dose.   midodrine  (PROAMATINE ) 5 MG tablet Take 5 mg by mouth See admin instructions. Send medication with resident on Tues/Thurs/Sat to dialysis. Do not administer at [facility], give to resident to take with him to dialysis.    Nutritional Supplements (,FEEDING SUPPLEMENT, PROSOURCE PLUS) liquid Take 30 mLs by mouth 2 (two) times daily.    Omega-3 Fatty Acids (OMEGA-3 FISH OIL PO) Take 2 capsules by mouth at bedtime. Omega 3 - DHA - EPA - Fish oil 300-1000mg  capsules    rosuvastatin  (CRESTOR ) 20 MG tablet Take 20 mg by mouth at bedtime.    sevelamer  carbonate (RENVELA ) 800 MG tablet Take 3 tablets (2,400 mg total) by mouth 3 (three) times daily with meals.    tamsulosin  (FLOMAX ) 0.4 MG CAPS capsule Take 0.4 mg by mouth at bedtime. Give 30 minutes after a meal. Do not crush or chew    torsemide  (DEMADEX ) 20 MG tablet Take 20 mg by mouth See admin instructions. Give 1 tablet (20mg ) by mouth once a day in the afternoon on Sunday, Monday, Wednesday, Friday.    UNABLE TO FIND Med Name: BD AutoShield Duo pen needle (30g x 3/16 needle)    No facility-administered encounter medications on file as of 11/11/2024.     SIGNIFICANT DIAGNOSTIC EXAMS  LABS REVIEWED PREVIOUS;       12-17-23: wbc 3.9; hgb 10.6; hct 30.5; mcv 103.7 plt 110; hgb A1c 6.6; PSA 8.02; chol 118; ldl 45; trig 222; hdl 29; tsh 10.663 02-16-24: wbc 4.3; hgb 9.3;  hct 27.9; mcv 104.5 plt 81; glucose 138; bun 80; creat 17.85; k+ 5.8; na++ 134; ca 9.3 gfr 2; protein 7.3 albumin  3.4 02-18-24: wbc 3.9; hgb 8.7; hct 25.5; mcv 102.4 plt 78; glucose 133; bun 52; creat  13.86; k+ 4.7; na++ 137; ca 8.7; gfr 3; protein 6.6 albumin  3.1 mag 2.5 phos 6.9  02-23-24: wbc 4.8; hgb 8.8; hct 26.8; mcv 105.9 plt 140; tsh 2.608; vitamin B 12: 4099 02-26-24:vitamin B12: 2246 03-24-24: hgb A1c 6.0 04-15-24: PSA 4.91   TODAY  09-01-24: wbc 4.7; hgb 11.0; hct 32.7; mcv 103.8 plt 110; glucose 191; bun 37; creat 9,39; k+ 4.0; na++ 138; ca 8.7; gfr 5 protein 7.0 albumin  3.8; hgb a1c 7.7; tsh 4.190   Review of Systems  Constitutional:  Negative for malaise/fatigue.  Respiratory:  Negative for cough and shortness of breath.   Cardiovascular:  Negative for chest pain, palpitations and leg swelling.  Gastrointestinal:  Negative for abdominal pain, constipation and heartburn.  Musculoskeletal:  Negative for back pain, joint pain and myalgias.  Skin: Negative.   Neurological:  Negative for dizziness.  Psychiatric/Behavioral:  The patient is not nervous/anxious.     Physical Exam Constitutional:      General: He is not in acute distress.    Appearance: He is well-developed. He is not diaphoretic.  Neck:     Thyroid : No thyromegaly.  Cardiovascular:     Rate and Rhythm: Normal rate and regular rhythm.     Heart sounds: Normal heart sounds.  Pulmonary:     Effort: Pulmonary effort is normal. No respiratory distress.     Breath sounds: Normal breath sounds.  Abdominal:     General: Bowel sounds are normal. There is no distension.     Palpations: Abdomen is soft.     Tenderness: There is no abdominal tenderness.  Musculoskeletal:        General: Normal range of motion.     Cervical back: Neck supple.     Right lower leg: No edema.     Left lower leg: No edema.  Lymphadenopathy:     Cervical: No cervical adenopathy.  Skin:    General: Skin is warm and dry.     Comments: Left arm fistula +thrill/bruit      Neurological:     Mental Status: He is alert. Mental status is at baseline.     Comments: SLUMS 2/30    Psychiatric:        Mood and Affect: Mood normal.        ASSESSMENT/ PLAN:  TODAY  Aortic atherosclerosis Major neurocognitive disorder Dependence on renal dialysis  Will continue current medications: Will continue current plan of care Will continue to monitor his status.   Time spent with patient: 40 minutes: medications; therapy; dietary     Barnie Seip NP [facility]   call [phone]     [1] No Known Allergies  "

## 2024-11-12 LAB — T3, FREE: T3, Free: 2.3 pg/mL (ref 2.0–4.4)

## 2024-11-18 ENCOUNTER — Encounter: Payer: Self-pay | Admitting: Urology

## 2024-11-18 ENCOUNTER — Ambulatory Visit: Admitting: Urology

## 2024-11-18 VITALS — BP 133/62 | HR 85

## 2024-11-18 DIAGNOSIS — R972 Elevated prostate specific antigen [PSA]: Secondary | ICD-10-CM

## 2024-11-18 MED ORDER — TAMSULOSIN HCL 0.4 MG PO CAPS: 0.4000 mg | ORAL_CAPSULE | Freq: Every day | ORAL | 11 refills | Status: AC

## 2024-11-18 NOTE — Progress Notes (Signed)
 "  11/18/2024 11:30 AM   Zachary [name] August 02, 1946 [id number]  Referring provider: Landy Barnie RAMAN, NP [address] [place],  [place] [address]  No chief complaint on file.   HPI: [name] is a 79yo here for follwoup fro elevated PSA. PSA increased to 7.05 from 4.9. PSA was 8 one year ago. IPSS 13 QOL 2 on flomax . Nocturia 2x. No straining to urinate. No othe complaints today   PMH: Past Medical History:  Diagnosis Date   Abnormal CT scan, kidney 10/06/2011   Acute pyelonephritis 10/07/2011   Anemia    normocytic   Anxiety    mental retardation   Bladder wall thickening 10/06/2011   BPH (benign prostatic hypertrophy)    Diabetes mellitus    Dialysis patient    Tuesday, Thursday and Saturday,    DVT of leg (deep venous thrombosis) (HCC) 12/25/2016   Edema     history of lower extremity edema   GERD (gastroesophageal reflux disease)    Heme positive stool    Hydronephrosis    Hyperkalemia    Hyperlipidemia    Hypernatremia    Hypertension    Hypothyroidism    Impaired speech    Infected prosthetic vascular graft    MR (mental retardation)    Muscle weakness    Obstructive uropathy    Perinephric abscess 10/07/2011   Poor historian poor historian   Primary colorectal adenocarcinoma (HCC) 04/2017   S/p total colectomy   Protein calorie malnutrition    Pyelonephritis    Renal failure (ARF), acute on chronic    Renal insufficiency    chronic history   Sepsis (HCC)    Smoking    Uremia    Urinary retention    UTI (lower urinary tract infection) 10/06/2011    Surgical History: Past Surgical History:  Procedure Laterality Date   A/V FISTULAGRAM N/A 08/13/2018   Procedure: A/V FISTULAGRAM - Right Upper;  Surgeon: Harvey Carlin BRAVO, MD;  [facility]: [facility] LAB;  Service: Cardiovascular;  Laterality: N/A;   A/V FISTULAGRAM N/A 11/22/2018   Procedure: A/V FISTULAGRAM - Right Upper;  Surgeon: Sheree Penne Bruckner, MD;  Location: [facility] INVASIVE CV LAB;   Service: Cardiovascular;  Laterality: N/A;   A/V FISTULAGRAM Left 10/08/2022   Procedure: A/V Fistulagram;  Surgeon: Lanis Fonda BRAVO, MD;  Location: [facility] INVASIVE CV LAB;  Service: Cardiovascular;  Laterality: Left;   A/V FISTULAGRAM Left 05/06/2023   Procedure: A/V Fistulagram;  Surgeon: Magda Debby SAILOR, MD;  Location: [facility] INVASIVE CV LAB;  Service: Cardiovascular;  Laterality: Left;   AV FISTULA PLACEMENT Left 07/06/2015   Procedure:  INSERTION LEFT ARM ARTERIOVENOUS GORTEX GRAFT;  Surgeon: Bruckner RAMAN Blade, MD;  Location: [facility] OR;  Service: Vascular;  Laterality: Left;   AV FISTULA PLACEMENT Right 02/26/2016   Procedure: ARTERIOVENOUS (AV) FISTULA CREATION ;  Surgeon: Bruckner RAMAN Blade, MD;  Location: [facility] OR;  Service: Vascular;  Laterality: Right;   AV FISTULA PLACEMENT Right 11/25/2018   Procedure: INSERTION OF ARTERIOVENOUS (AV) ARTEGRAFT RIGHT UPPER ARM;  Surgeon: Sheree Penne Bruckner, MD;  Location: [facility] OR;  Service: Vascular;  Laterality: Right;   AV FISTULA PLACEMENT Left 05/28/2021   Procedure: LEFT ARM ARTERIOVENOUS (AV) FISTULA;  Surgeon: Oris Krystal FALCON, MD;  Location: [facility] ORS;  Service: Vascular;  Laterality: Left;   AVGG REMOVAL Left 10/09/2015   Procedure: REMOVAL OF ARTERIOVENOUS GORETEX GRAFT (AVGG) Evacuation of Lymphocele, Vein Patch angioplasty of brachial artery.;  Surgeon: Bruckner RAMAN Blade, MD;  Location: [facility] OR;  Service: Vascular;  Laterality: Left;   BASCILIC VEIN TRANSPOSITION Right 02/26/2016   Procedure: Right BASCILIC VEIN TRANSPOSITION;  Surgeon: Lonni GORMAN Blade, MD;  Location: [facility] OR;  Service: Vascular;  Laterality: Right;   BASCILIC VEIN TRANSPOSITION Left 07/30/2021   Procedure: LEFT ARM SECOND STAGE BASILIC VEIN TRANSPOSITION;  Surgeon: Oris Krystal FALCON, MD;  Location: [facility] ORS;  Service: Vascular;  Laterality: Left;   CIRCUMCISION N/A 01/04/2014   Procedure: CIRCUMCISION ADULT (procedure #1);  Surgeon: Mohammad I Javaid, MD;  Location: [facility] ORS;   Service: Urology;  Laterality: N/A;   COLECTOMY N/A 05/04/2017   Procedure: TOTAL COLECTOMY;  Surgeon: Mavis Anes, MD;  Location: [facility] ORS;  Service: General;  Laterality: N/A;   COLONOSCOPY N/A 04/27/2017   Surgeon: Shaaron Lamar HERO, MD; annular mass in the ascending colon likely representing cancer biopsied, multiple 6-22 mm polyps removed, clean rectum.  Pathology with multiple tubular adenomas, high-grade dysplasia noted in ascending colon and splenic flexure.   CYSTOSCOPY W/ RETROGRADES Bilateral 06/29/2015   Procedure: CYSTOSCOPY, DILATION OF URETHRAL STRICTURE WITH BILATERAL RETROGRADE PYELOGRAM,SUPRAPUBIC TUBE CHANGE;  Surgeon: Donnice Brooks, MD;  Location: [facility] ORS;  Service: Urology;  Laterality: Bilateral;   CYSTOSCOPY WITH URETHRAL DILATATION N/A 12/29/2013   Procedure: CYSTOSCOPY WITH URETHRAL DILATATION;  Surgeon: Emery LILLETTE Blaze, MD;  Location: [facility]S;  Service: Urology;  Laterality: N/A;   ESOPHAGOGASTRODUODENOSCOPY N/A 04/27/2017   Procedure: ESOPHAGOGASTRODUODENOSCOPY (EGD);  Surgeon: Shaaron Lamar HERO, MD;  [facility];  Service: Endoscopy;  Laterality: N/A;   FLEXIBLE SIGMOIDOSCOPY N/A 12/02/2021   Procedure: FLEXIBLE SIGMOIDOSCOPY;  Surgeon: Cindie Carlin POUR, DO;  Location: [facility];  Service: Endoscopy;  Laterality: N/A;  10:30am, dialysis patient   INSERTION OF DIALYSIS CATHETER Right 11/25/2018   Procedure: INSERTION OF DIALYSIS CATHETER RIGHT INTERNAL JUGULAR;  Surgeon: Sheree Penne Lonni, MD;  Location: [facility] OR;  Service: Vascular;  Laterality: Right;   IR AV DIALY SHUNT INTRO NEEDLE/INTRAC INITIAL W/PTA/STENT/IMG LT Left 02/16/2024   IR AV DIALY SHUNT INTRO NEEDLE/INTRACATH INITIAL W/PTA/IMG RIGHT Right 09/07/2018   IR FLUORO GUIDE CV LINE RIGHT  10/16/2020   IR FLUORO GUIDE CV LINE RIGHT  10/03/2022   IR REMOVAL TUN CV CATH W/O FL  01/12/2019   IR THROMBECTOMY AV FISTULA W/THROMBOLYSIS/PTA INC/SHUNT/IMG RIGHT Right 04/26/2018   IR US  GUIDE VASC  ACCESS LEFT  02/17/2024   IR US  GUIDE VASC ACCESS RIGHT  04/26/2018   IR US  GUIDE VASC ACCESS RIGHT  09/07/2018   IR US  GUIDE VASC ACCESS RIGHT  10/16/2020   IR US  GUIDE VASC ACCESS RIGHT  10/16/2020   IR US  GUIDE VASC ACCESS RIGHT  10/03/2022   ORIF FEMUR FRACTURE Right 11/22/2016   Procedure: OPEN REDUCTION INTERNAL FIXATION (ORIF) DISTAL FEMUR FRACTURE;  Surgeon: Redell Shoals, MD;  Location: [facility] OR;  Service: Orthopedics;  Laterality: Right;   PATCH ANGIOPLASTY Right 12/10/2017   Procedure: PATCH ANGIOPLASTY;  Surgeon: Blade Lonni GORMAN, MD;  Location: [facility] OR;  Service: Vascular;  Laterality: Right;   PERIPHERAL VASCULAR BALLOON ANGIOPLASTY  08/13/2018   Procedure: PERIPHERAL VASCULAR BALLOON ANGIOPLASTY;  Surgeon: Harvey Carlin BRAVO, MD;  Location: [facility]VAS[facility] CV LAB;  Service: Cardiovascular;;  right AV fistula    PERIPHERAL VASCULAR BALLOON ANGIOPLASTY  11/22/2018   Procedure: PERIPHERAL VASCULAR BALLOON ANGIOPLASTY;  Surgeon: Sheree Penne Lonni, MD;  Location: [facility] INVASIVE CV LAB;  Service: Cardiovascular;;  rt AV fistula   PERIPHERAL VASCULAR BALLOON ANGIOPLASTY  10/08/2022   Procedure: PERIPHERAL  VASCULAR BALLOON ANGIOPLASTY;  Surgeon: Lanis Fonda BRAVO, MD;  Location: [facility] INVASIVE CV LAB;  Service: Cardiovascular;;  arterial anastamosis   PERIPHERAL VASCULAR CATHETERIZATION N/A 10/08/2015   Procedure: A/V Shuntogram;  Surgeon: Lonni GORMAN Blade, MD;  Location: [facility] INVASIVE CV LAB;  Service: Cardiovascular;  Laterality: N/A;   PERIPHERAL VASCULAR INTERVENTION Left 10/08/2022   Procedure: PERIPHERAL VASCULAR INTERVENTION;  Surgeon: Lanis Fonda BRAVO, MD;  Location: [facility] INVASIVE CV LAB;  Service: Cardiovascular;  Laterality: Left;  left AVF   REMOVAL OF A DIALYSIS CATHETER N/A 12/03/2021   Procedure: MINOR REMOVAL OF A TUNNELED DIALYSIS CATHETER;  Surgeon: Oris Krystal FALCON, MD;  Location: [facility];  Service: Vascular;  Laterality: N/A;   REMOVAL OF A DIALYSIS CATHETER N/A 11/18/2022    Procedure: MINOR REMOVAL OF A TUNNELED DIALYSIS CATHETER;  Surgeon: Oris Krystal FALCON, MD;  Location: [facility]S;  Service: Vascular;  Laterality: N/A;   THROMBECTOMY W/ EMBOLECTOMY Right 12/10/2017   Procedure: THROMBECTOMY REVISION RIGHT ARM  ARTERIOVENOUS FISTULA;  Surgeon: Blade Lonni GORMAN, MD;  Location: [facility] OR;  Service: Vascular;  Laterality: Right;   TRANSURETHRAL RESECTION OF PROSTATE N/A 01/04/2014   Procedure: TRANSURETHRAL RESECTION OF THE PROSTATE (TURP) (procedure #2);  Surgeon: Mohammad I Javaid, MD;  Location: [facility];  Service: Urology;  Laterality: N/A;    Home Medications:  Allergies as of 11/18/2024   No Known Allergies      Medication List        Accurate as of November 18, 2024 11:30 AM. If you have any questions, ask your nurse or doctor.          (feeding supplement) PROSource Plus liquid Take 30 mLs by mouth 2 (two) times daily.   aspirin  81 MG chewable tablet Chew 81 mg by mouth daily.   levothyroxine  150 MCG tablet Commonly known as: SYNTHROID  Take 150 mcg by mouth daily before breakfast.   midodrine  10 MG tablet Commonly known as: PROAMATINE  Take 10 mg by mouth See admin instructions. Send medication with resident on Tues/Thurs/Sat to dialysis. Do not administer at [facility], give to resident to take with him to dialysis.   midodrine  5 MG tablet Commonly known as: PROAMATINE  Take 5 mg by mouth See admin instructions. Send medication with resident on Tues/Thurs/Sat to dialysis. Do not administer at [facility], give to resident to take with him to dialysis.   OMEGA-3 FISH OIL PO Take 2 capsules by mouth at bedtime. Omega 3 - DHA - EPA - Fish oil 300-1000mg  capsules   rosuvastatin  20 MG tablet Commonly known as: CRESTOR  Take 20 mg by mouth at bedtime.   Semglee  (yfgn) 100 UNIT/ML Pen Generic drug: insulin  glargine-yfgn Inject 12 Units into the skin at bedtime.   sevelamer  carbonate 800 MG tablet Commonly known as: RENVELA  Take 3 tablets  (2,400 mg total) by mouth 3 (three) times daily with meals.   tamsulosin  0.4 MG Caps capsule Commonly known as: FLOMAX  Take 0.4 mg by mouth at bedtime. Give 30 minutes after a meal. Do not crush or chew   torsemide  20 MG tablet Commonly known as: DEMADEX  Take 20 mg by mouth See admin instructions. Give 1 tablet (20mg ) by mouth once a day in the afternoon on Sunday, Monday, Wednesday, Friday.   UNABLE TO FIND Med Name: BD AutoShield Duo pen needle (30g x 3/16 needle)        Allergies: Allergies[1]  Family History: Family History  Problem Relation Age of Onset   Cancer Mother  Colon cancer Neg Hx     Social History:  reports that he has never smoked. He has never used smokeless tobacco. He reports that he does not drink alcohol and does not use drugs.  ROS: All other review of systems were reviewed and are negative except what is noted above in HPI  Physical Exam: BP 133/62   Pulse 85   Constitutional:  Alert and oriented, No acute distress. HEENT: [place] AT, moist mucus membranes.  Trachea midline, no masses. Cardiovascular: No clubbing, cyanosis, or edema. Respiratory: Normal respiratory effort, no increased work of breathing. GI: Abdomen is soft, nontender, nondistended, no abdominal masses GU: No CVA tenderness.  Lymph: No cervical or inguinal lymphadenopathy. Skin: No rashes, bruises or suspicious lesions. Neurologic: Grossly intact, no focal deficits, moving all 4 extremities. Psychiatric: Normal mood and affect.  Laboratory Data: Lab Results  Component Value Date   WBC 4.7 09/01/2024   HGB 11.0 (L) 09/01/2024   HCT 32.7 (L) 09/01/2024   MCV 103.8 (H) 09/01/2024   PLT 110 (L) 09/01/2024    Lab Results  Component Value Date   CREATININE 9.39 (H) 09/01/2024    No results found for: PSA  No results found for: TESTOSTERONE  Lab Results  Component Value Date   HGBA1C 7.7 (H) 09/01/2024    Urinalysis    Component Value Date/Time   COLORURINE  AMBER (A) 06/12/2017 0615   APPEARANCEUR HAZY (A) 06/12/2017 0615   LABSPEC 1.021 06/12/2017 0615   PHURINE 5.0 06/12/2017 0615   GLUCOSEU NEGATIVE 06/12/2017 0615   HGBUR NEGATIVE 06/12/2017 0615   BILIRUBINUR NEGATIVE 06/12/2017 0615   KETONESUR NEGATIVE 06/12/2017 0615   PROTEINUR 30 (A) 06/12/2017 0615   UROBILINOGEN 0.2 07/20/2015 1850   NITRITE NEGATIVE 06/12/2017 0615   LEUKOCYTESUR NEGATIVE 06/12/2017 0615    Lab Results  Component Value Date   BACTERIA NONE SEEN 06/12/2017    Pertinent Imaging:  No results found for this or any previous visit.  Results for orders placed during the hospital encounter of 10/01/11  US  Venous Img Lower Bilateral  Narrative *RADIOLOGY REPORT*  Clinical Data: Bilateral lower extremity edema, history diabetes  VENOUS DUPLEX ULTRASOUND OF BILATERAL LOWER EXTREMITIES  Technique:  Gray-scale sonography with graded compression, as well as color Doppler and duplex ultrasound, were performed to evaluate the deep venous system of both lower extremities from the level of the common femoral vein through the popliteal and proximal calf veins.  Spectral Doppler was utilized to evaluate flow at rest and with distal augmentation maneuvers.  Comparison:  None  Findings: Deep venous systems appear patent and compressible from groin to popliteal fossa bilaterally.  Spontaneous venous flow present with intact augmentation and evidence of respiratory phasicity.  No intraluminal thrombus identified.  Flow within the right popliteal vein is slightly sluggish.  Visualized portions of the greater saphenous systems appear patent.  IMPRESSION: No evidence of deep venous thrombosis in the lower extremities.  Original Report Authenticated By: ONEIL RONAL KISS, M.D.  No results found for this or any previous visit.  No results found for this or any previous visit.  Results for orders placed during the hospital encounter of 04/03/16  US   Renal  Narrative CLINICAL DATA:  Pain from catheter.  Renal insufficiency.  EXAM: RENAL / URINARY TRACT ULTRASOUND COMPLETE  COMPARISON:  CT abdomen dated 03/10/2016. Also abdomen ultrasound dated 05/24/2015.  FINDINGS: Right Kidney:  Length: 11.4 cm. Increased cortical echogenicity, similar to the previous abdomen ultrasound. Stable mild hydronephrosis.  Left  Kidney:  Length: 10 cm. Increased renal cortex echogenicity. Benign-appearing cyst within the midpole region of the left kidney, measuring approximately 2.8 cm greatest dimension, probably stable given differences in measurement technique. No hydronephrosis.  Bladder:  Bladder decompressed by Foley catheter. Bladder wall thickness measured at 1.3 cm, probably accentuated to some degree by the bladder decompression.  IMPRESSION: 1. Bladder decompressed by Foley catheter. Bladder wall appears thickened, measured at 1.3 cm thickness, probably accentuated to some degree by the bladder decompression. Recommend correlation with urinalysis to exclude associated cystitis. 2. Stable mild right-sided hydronephrosis. 3. Echogenic renal cortices bilaterally suggesting chronic medical renal disease. These results will be called to the ordering clinician or representative by the Radiologist Assistant, and communication documented in the PACS or zVision Dashboard.   Electronically Signed By: Lael Hines M.D. On: 04/03/2016 13:37  No results found for this or any previous visit.  No results found for this or any previous visit.  No results found for this or any previous visit.   Assessment & Plan:    1. Elevated PSA (Primary) Continue surveillance. Followup 6 months with a PSA   No follow-ups on file.  Belvie Clara, MD  [facility] [place]      [1] No Known Allergies  "

## 2024-11-18 NOTE — Patient Instructions (Signed)
 Prostate-Specific Antigen Test Why am I having this test? The prostate-specific antigen (PSA) test is a screening test for prostate cancer. It can identify early signs of prostate cancer, which may allow for early detection and more effective treatment. Your health care provider may recommend that you have a PSA test starting at age 77 or that you have one earlier if you are at higher risk for prostate cancer. You may also have a PSA test: To monitor treatment of prostate cancer. To check whether prostate cancer has returned after treatment. What is being tested? This test measures the amount of PSA in your blood. PSA is a protein that is made in the prostate. The prostate naturally produces more PSA as you age, but very high levels may be a sign of a medical condition. What kind of sample is taken?  A blood sample is required for this test. It is usually collected by inserting a needle into a blood vessel but can also be collected by sticking a finger with a small needle. Blood for this test should be drawn before having an exam of the prostate that involves digital rectal examination to avoid affecting the results. How do I prepare for this test? Do not ejaculate starting 24 hours before your test, or as long as told by your health care provider, as this can cause an elevation in PSA. Do not undergo any procedures that require manipulation of the prostate, such as biopsy or surgery, for 6 weeks before the test is done as this can cause an elevation in PSA. Tell a health care provider about: Any signs you may have of other conditions that can affect PSA levels, such as: An enlarged prostate that is not caused by cancer (benign prostatic hyperplasia, or BPH). This condition is very common in older men. A prostate or urinary tract infection. Any allergies you have. All medicines you are taking, including vitamins, herbs, eye drops, creams, and over-the-counter medicines. This also  includes: Medicines to assist with hair growth, such as finasteride. Any recent exposure to a medicine called diethylstilbestrol (DES). Medicines such as male hormones (like testosterone) or other medicines that raise testosterone levels. Any bleeding problems you have. Any recent procedures you have had, especially any procedures involving the prostate or rectum. Any medical conditions you have. How are the results reported? Your test results will be reported as a value that indicates how much PSA is in your blood. This will be given as nanograms of PSA per milliliter of blood (ng/mL). Your health care provider will compare your results to normal ranges that were established after testing a large group of people (reference ranges). Reference ranges may vary among labs and hospitals. PSA levels vary from person to person and generally increase with age. Because of this variation, there is no single PSA value that is considered normal for everyone. Instead, PSA reference ranges are used to describe whether your PSA levels are considered low or high (elevated). Common reference ranges are: Low: 0-2.5 ng/mL. Slightly to moderately elevated: 2.6-10.0 ng/mL. Moderately elevated: 10.0-19.9 ng/mL. Significantly elevated: 20 ng/mL or greater. What do the results mean? A test result that is higher than 4 ng/mL may mean that you have prostate cancer. However, a PSA test by itself is not enough to diagnose prostate cancer. High PSA levels may also be caused by the natural aging process, prostate infection (prostatitis), or BPH. PSA screening cannot tell you if your PSA is high due to cancer or a different cause. A prostate  biopsy is the only way to diagnose prostate cancer. A risk of having the PSA test is diagnosing and treating prostate cancer that would never have caused any symptoms or problems (overdiagnosis and overtreatment). Talk with your health care provider about what your results mean. In some  cases, your health care provider may do more testing to confirm the results. Questions to ask your health care provider Ask your health care provider, or the department that is doing the test: When will my results be ready? How will I get my results? What are my treatment options? What other tests do I need? What are my next steps? Summary The prostate-specific antigen (PSA) test is a screening test for prostate cancer. Your health care provider may recommend that you have a PSA test starting at age 21 or that you have one earlier if you are at higher risk for prostate cancer. A test result that is higher than 4 ng/mL may mean that you have prostate cancer. However, elevated levels can be caused by a number of conditions other than prostate cancer. Talk with your health care provider about what your results mean. This information is not intended to replace advice given to you by your health care provider. Make sure you discuss any questions you have with your health care provider. Document Revised: 02/20/2021 Document Reviewed: 02/20/2021 Elsevier Patient Education  2024 [employer].

## 2025-05-29 ENCOUNTER — Other Ambulatory Visit

## 2025-06-07 ENCOUNTER — Ambulatory Visit: Admitting: Urology
